# Patient Record
Sex: Female | Born: 1935
Health system: Southern US, Community
[De-identification: ages and names within clinical notes are randomized; demographics above are authoritative.]

## PROBLEM LIST (undated history)

## (undated) DIAGNOSIS — F0390 Unspecified dementia without behavioral disturbance: Secondary | ICD-10-CM

## (undated) DIAGNOSIS — C801 Malignant (primary) neoplasm, unspecified: Secondary | ICD-10-CM

## (undated) DIAGNOSIS — R200 Anesthesia of skin: Secondary | ICD-10-CM

## (undated) DIAGNOSIS — I2510 Atherosclerotic heart disease of native coronary artery without angina pectoris: Secondary | ICD-10-CM

## (undated) DIAGNOSIS — D649 Anemia, unspecified: Secondary | ICD-10-CM

## (undated) DIAGNOSIS — I5032 Chronic diastolic (congestive) heart failure: Secondary | ICD-10-CM

## (undated) DIAGNOSIS — I509 Heart failure, unspecified: Secondary | ICD-10-CM

## (undated) DIAGNOSIS — I10 Essential (primary) hypertension: Secondary | ICD-10-CM

## (undated) DIAGNOSIS — G4700 Insomnia, unspecified: Secondary | ICD-10-CM

## (undated) DIAGNOSIS — M353 Polymyalgia rheumatica: Secondary | ICD-10-CM

## (undated) DIAGNOSIS — M2540 Effusion, unspecified joint: Secondary | ICD-10-CM

## (undated) DIAGNOSIS — C50919 Malignant neoplasm of unspecified site of unspecified female breast: Secondary | ICD-10-CM

## (undated) DIAGNOSIS — F419 Anxiety disorder, unspecified: Secondary | ICD-10-CM

## (undated) DIAGNOSIS — R42 Dizziness and giddiness: Secondary | ICD-10-CM

## (undated) DIAGNOSIS — M1990 Unspecified osteoarthritis, unspecified site: Secondary | ICD-10-CM

## (undated) DIAGNOSIS — R519 Headache, unspecified: Secondary | ICD-10-CM

## (undated) DIAGNOSIS — Z955 Presence of coronary angioplasty implant and graft: Secondary | ICD-10-CM

## (undated) DIAGNOSIS — M2550 Pain in unspecified joint: Secondary | ICD-10-CM

## (undated) DIAGNOSIS — IMO0001 Reserved for inherently not codable concepts without codable children: Secondary | ICD-10-CM

## (undated) DIAGNOSIS — M109 Gout, unspecified: Secondary | ICD-10-CM

## (undated) DIAGNOSIS — K219 Gastro-esophageal reflux disease without esophagitis: Secondary | ICD-10-CM

## (undated) DIAGNOSIS — N183 Chronic kidney disease, stage 3 unspecified: Secondary | ICD-10-CM

## (undated) DIAGNOSIS — F32A Depression, unspecified: Secondary | ICD-10-CM

## (undated) DIAGNOSIS — G4733 Obstructive sleep apnea (adult) (pediatric): Secondary | ICD-10-CM

## (undated) DIAGNOSIS — IMO0002 Reserved for concepts with insufficient information to code with codable children: Secondary | ICD-10-CM

## (undated) DIAGNOSIS — Z972 Presence of dental prosthetic device (complete) (partial): Secondary | ICD-10-CM

## (undated) DIAGNOSIS — F329 Major depressive disorder, single episode, unspecified: Secondary | ICD-10-CM

## (undated) DIAGNOSIS — Z86718 Personal history of other venous thrombosis and embolism: Secondary | ICD-10-CM

## (undated) DIAGNOSIS — I2699 Other pulmonary embolism without acute cor pulmonale: Secondary | ICD-10-CM

## (undated) DIAGNOSIS — E785 Hyperlipidemia, unspecified: Secondary | ICD-10-CM

## (undated) DIAGNOSIS — E1165 Type 2 diabetes mellitus with hyperglycemia: Secondary | ICD-10-CM

## (undated) DIAGNOSIS — G8929 Other chronic pain: Secondary | ICD-10-CM

## (undated) DIAGNOSIS — I219 Acute myocardial infarction, unspecified: Secondary | ICD-10-CM

## (undated) DIAGNOSIS — T7840XA Allergy, unspecified, initial encounter: Secondary | ICD-10-CM

## (undated) DIAGNOSIS — N189 Chronic kidney disease, unspecified: Secondary | ICD-10-CM

## (undated) DIAGNOSIS — R300 Dysuria: Secondary | ICD-10-CM

## (undated) DIAGNOSIS — M1710 Unilateral primary osteoarthritis, unspecified knee: Secondary | ICD-10-CM

## (undated) DIAGNOSIS — R51 Headache: Secondary | ICD-10-CM

## (undated) HISTORY — PX: CARDIAC CATHETERIZATION: SHX172

## (undated) HISTORY — DX: Other pulmonary embolism without acute cor pulmonale: I26.99

## (undated) HISTORY — DX: Morbid (severe) obesity due to excess calories: E66.01

## (undated) HISTORY — DX: Major depressive disorder, single episode, unspecified: F32.9

## (undated) HISTORY — DX: Heart failure, unspecified: I50.9

## (undated) HISTORY — DX: Atherosclerotic heart disease of native coronary artery without angina pectoris: I25.10

## (undated) HISTORY — DX: Other chronic pain: G89.29

## (undated) HISTORY — DX: Insomnia, unspecified: G47.00

## (undated) HISTORY — DX: Presence of coronary angioplasty implant and graft: Z95.5

## (undated) HISTORY — DX: Gout, unspecified: M10.9

## (undated) HISTORY — DX: Gastro-esophageal reflux disease without esophagitis: K21.9

## (undated) HISTORY — DX: Allergy, unspecified, initial encounter: T78.40XA

## (undated) HISTORY — PX: APPENDECTOMY: SHX54

## (undated) HISTORY — DX: Malignant neoplasm of unspecified site of unspecified female breast: C50.919

## (undated) HISTORY — PX: CORONARY ANGIOPLASTY: SHX604

## (undated) HISTORY — DX: Chronic diastolic (congestive) heart failure: I50.32

## (undated) HISTORY — PX: COLONOSCOPY: SHX174

## (undated) HISTORY — DX: Unspecified dementia, unspecified severity, without behavioral disturbance, psychotic disturbance, mood disturbance, and anxiety: F03.90

## (undated) HISTORY — DX: Essential (primary) hypertension: I10

## (undated) HISTORY — PX: EYE SURGERY: SHX253

## (undated) HISTORY — DX: Polymyalgia rheumatica: M35.3

## (undated) HISTORY — DX: Chronic kidney disease, stage 3 unspecified: N18.30

## (undated) HISTORY — DX: Type 2 diabetes mellitus with hyperglycemia: E11.65

## (undated) HISTORY — DX: Unspecified osteoarthritis, unspecified site: M19.90

## (undated) HISTORY — DX: Anxiety disorder, unspecified: F41.9

## (undated) HISTORY — DX: Anemia, unspecified: D64.9

## (undated) HISTORY — DX: Obstructive sleep apnea (adult) (pediatric): G47.33

## (undated) HISTORY — DX: Depression, unspecified: F32.A

## (undated) HISTORY — DX: Hyperlipidemia, unspecified: E78.5

## (undated) HISTORY — PX: ABDOMINAL HYSTERECTOMY: SHX81

## (undated) HISTORY — DX: Dysuria: R30.0

## (undated) HISTORY — DX: Reserved for concepts with insufficient information to code with codable children: IMO0002

## (undated) HISTORY — DX: Unilateral primary osteoarthritis, unspecified knee: M17.10

---
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
## 1975-05-09 HISTORY — PX: GASTRIC BYPASS: SHX52

## 1997-11-04 ENCOUNTER — Emergency Department (HOSPITAL_COMMUNITY): Admission: EM | Admit: 1997-11-04 | Discharge: 1997-11-04 | Payer: Self-pay

## 1997-11-04 ENCOUNTER — Encounter: Payer: Self-pay | Admitting: Internal Medicine

## 1997-12-29 ENCOUNTER — Other Ambulatory Visit: Admission: RE | Admit: 1997-12-29 | Discharge: 1997-12-29 | Payer: Self-pay | Admitting: Obstetrics and Gynecology

## 1998-01-20 ENCOUNTER — Ambulatory Visit (HOSPITAL_COMMUNITY): Admission: RE | Admit: 1998-01-20 | Discharge: 1998-01-20 | Payer: Self-pay | Admitting: Obstetrics and Gynecology

## 1999-02-20 ENCOUNTER — Emergency Department (HOSPITAL_COMMUNITY): Admission: EM | Admit: 1999-02-20 | Discharge: 1999-02-20 | Payer: Self-pay

## 1999-05-17 ENCOUNTER — Encounter (INDEPENDENT_AMBULATORY_CARE_PROVIDER_SITE_OTHER): Payer: Self-pay | Admitting: Specialist

## 1999-05-17 ENCOUNTER — Other Ambulatory Visit: Admission: RE | Admit: 1999-05-17 | Discharge: 1999-05-17 | Payer: Self-pay | Admitting: Family Medicine

## 1999-05-17 ENCOUNTER — Other Ambulatory Visit: Admission: RE | Admit: 1999-05-17 | Discharge: 1999-05-17 | Payer: Self-pay | Admitting: Obstetrics and Gynecology

## 2000-05-24 ENCOUNTER — Other Ambulatory Visit: Admission: RE | Admit: 2000-05-24 | Discharge: 2000-05-24 | Payer: Self-pay | Admitting: Obstetrics and Gynecology

## 2000-05-28 ENCOUNTER — Encounter: Payer: Self-pay | Admitting: Emergency Medicine

## 2000-05-28 ENCOUNTER — Inpatient Hospital Stay (HOSPITAL_COMMUNITY): Admission: EM | Admit: 2000-05-28 | Discharge: 2000-05-31 | Payer: Self-pay | Admitting: Emergency Medicine

## 2000-12-03 ENCOUNTER — Encounter: Admission: RE | Admit: 2000-12-03 | Discharge: 2001-03-03 | Payer: Self-pay | Admitting: Internal Medicine

## 2001-03-11 ENCOUNTER — Encounter: Admission: RE | Admit: 2001-03-11 | Discharge: 2001-06-09 | Payer: Self-pay | Admitting: Internal Medicine

## 2001-07-10 ENCOUNTER — Encounter: Admission: RE | Admit: 2001-07-10 | Discharge: 2001-10-08 | Payer: Self-pay | Admitting: Internal Medicine

## 2001-07-23 ENCOUNTER — Other Ambulatory Visit: Admission: RE | Admit: 2001-07-23 | Discharge: 2001-07-23 | Payer: Self-pay | Admitting: Obstetrics and Gynecology

## 2002-02-24 ENCOUNTER — Encounter: Payer: Self-pay | Admitting: Emergency Medicine

## 2002-02-24 ENCOUNTER — Inpatient Hospital Stay (HOSPITAL_COMMUNITY): Admission: EM | Admit: 2002-02-24 | Discharge: 2002-02-27 | Payer: Self-pay | Admitting: Emergency Medicine

## 2002-05-08 DIAGNOSIS — I219 Acute myocardial infarction, unspecified: Secondary | ICD-10-CM

## 2002-05-08 HISTORY — PX: OTHER SURGICAL HISTORY: SHX169

## 2002-05-08 HISTORY — DX: Acute myocardial infarction, unspecified: I21.9

## 2002-09-05 ENCOUNTER — Other Ambulatory Visit: Admission: RE | Admit: 2002-09-05 | Discharge: 2002-09-05 | Payer: Self-pay | Admitting: Obstetrics and Gynecology

## 2002-09-06 ENCOUNTER — Emergency Department (HOSPITAL_COMMUNITY): Admission: EM | Admit: 2002-09-06 | Discharge: 2002-09-06 | Payer: Self-pay

## 2002-12-03 ENCOUNTER — Inpatient Hospital Stay (HOSPITAL_COMMUNITY): Admission: EM | Admit: 2002-12-03 | Discharge: 2002-12-05 | Payer: Self-pay | Admitting: Emergency Medicine

## 2002-12-03 ENCOUNTER — Encounter: Payer: Self-pay | Admitting: Emergency Medicine

## 2003-01-26 ENCOUNTER — Encounter (HOSPITAL_COMMUNITY): Admission: RE | Admit: 2003-01-26 | Discharge: 2003-04-10 | Payer: Self-pay | Admitting: *Deleted

## 2003-03-30 ENCOUNTER — Ambulatory Visit (HOSPITAL_COMMUNITY): Admission: RE | Admit: 2003-03-30 | Discharge: 2003-03-30 | Payer: Self-pay | Admitting: *Deleted

## 2004-01-07 ENCOUNTER — Other Ambulatory Visit: Admission: RE | Admit: 2004-01-07 | Discharge: 2004-01-07 | Payer: Self-pay | Admitting: Obstetrics and Gynecology

## 2004-07-25 ENCOUNTER — Ambulatory Visit: Payer: Self-pay | Admitting: Internal Medicine

## 2004-08-16 ENCOUNTER — Ambulatory Visit: Payer: Self-pay | Admitting: Cardiology

## 2004-08-17 ENCOUNTER — Inpatient Hospital Stay (HOSPITAL_COMMUNITY): Admission: EM | Admit: 2004-08-17 | Discharge: 2004-08-20 | Payer: Self-pay | Admitting: Emergency Medicine

## 2004-08-19 ENCOUNTER — Ambulatory Visit: Payer: Self-pay | Admitting: Internal Medicine

## 2004-08-20 ENCOUNTER — Encounter: Payer: Self-pay | Admitting: Internal Medicine

## 2004-08-26 ENCOUNTER — Ambulatory Visit: Payer: Self-pay | Admitting: Cardiology

## 2004-08-30 ENCOUNTER — Ambulatory Visit: Payer: Self-pay | Admitting: Internal Medicine

## 2004-09-07 ENCOUNTER — Ambulatory Visit (HOSPITAL_BASED_OUTPATIENT_CLINIC_OR_DEPARTMENT_OTHER): Admission: RE | Admit: 2004-09-07 | Discharge: 2004-09-07 | Payer: Self-pay | Admitting: Internal Medicine

## 2004-09-14 ENCOUNTER — Ambulatory Visit: Payer: Self-pay | Admitting: Pulmonary Disease

## 2004-09-22 ENCOUNTER — Ambulatory Visit: Payer: Self-pay | Admitting: Internal Medicine

## 2004-09-28 ENCOUNTER — Ambulatory Visit: Payer: Self-pay | Admitting: Family Medicine

## 2005-03-10 ENCOUNTER — Ambulatory Visit: Payer: Self-pay | Admitting: Internal Medicine

## 2005-05-09 ENCOUNTER — Other Ambulatory Visit: Admission: RE | Admit: 2005-05-09 | Discharge: 2005-05-09 | Payer: Self-pay | Admitting: Obstetrics and Gynecology

## 2005-05-18 ENCOUNTER — Emergency Department (HOSPITAL_COMMUNITY): Admission: EM | Admit: 2005-05-18 | Discharge: 2005-05-18 | Payer: Self-pay | Admitting: Emergency Medicine

## 2005-05-22 ENCOUNTER — Ambulatory Visit: Payer: Self-pay | Admitting: Cardiology

## 2005-05-23 ENCOUNTER — Ambulatory Visit: Payer: Self-pay | Admitting: Cardiology

## 2005-05-25 ENCOUNTER — Encounter (HOSPITAL_COMMUNITY): Admission: RE | Admit: 2005-05-25 | Discharge: 2005-08-23 | Payer: Self-pay | Admitting: Cardiology

## 2005-07-05 ENCOUNTER — Ambulatory Visit: Payer: Self-pay | Admitting: Cardiology

## 2005-07-18 ENCOUNTER — Ambulatory Visit: Payer: Self-pay | Admitting: Cardiology

## 2005-07-27 ENCOUNTER — Ambulatory Visit: Payer: Self-pay | Admitting: Internal Medicine

## 2005-09-07 ENCOUNTER — Ambulatory Visit: Payer: Self-pay | Admitting: Internal Medicine

## 2005-12-20 ENCOUNTER — Ambulatory Visit: Payer: Self-pay | Admitting: Cardiology

## 2005-12-27 ENCOUNTER — Ambulatory Visit: Payer: Self-pay | Admitting: Internal Medicine

## 2006-01-05 ENCOUNTER — Encounter: Admission: RE | Admit: 2006-01-05 | Discharge: 2006-01-05 | Payer: Self-pay | Admitting: Specialist

## 2006-02-02 ENCOUNTER — Observation Stay (HOSPITAL_COMMUNITY): Admission: EM | Admit: 2006-02-02 | Discharge: 2006-02-03 | Payer: Self-pay | Admitting: *Deleted

## 2006-02-02 ENCOUNTER — Ambulatory Visit: Payer: Self-pay | Admitting: Cardiology

## 2006-02-05 ENCOUNTER — Ambulatory Visit: Payer: Self-pay | Admitting: Internal Medicine

## 2006-05-11 ENCOUNTER — Ambulatory Visit: Payer: Self-pay | Admitting: Internal Medicine

## 2006-05-11 LAB — CONVERTED CEMR LAB
Hgb A1c MFr Bld: 8.9 % — ABNORMAL HIGH (ref 4.6–6.0)
Pro B Natriuretic peptide (BNP): 197 pg/mL — ABNORMAL HIGH (ref 0.0–100.0)

## 2006-06-11 ENCOUNTER — Ambulatory Visit: Payer: Self-pay | Admitting: Cardiology

## 2006-07-05 ENCOUNTER — Ambulatory Visit: Payer: Self-pay | Admitting: Internal Medicine

## 2006-08-15 ENCOUNTER — Ambulatory Visit: Payer: Self-pay | Admitting: Internal Medicine

## 2006-08-27 ENCOUNTER — Ambulatory Visit: Payer: Self-pay | Admitting: Internal Medicine

## 2006-10-16 ENCOUNTER — Ambulatory Visit: Payer: Self-pay | Admitting: Internal Medicine

## 2006-11-16 ENCOUNTER — Ambulatory Visit: Payer: Self-pay | Admitting: Cardiology

## 2006-11-16 ENCOUNTER — Inpatient Hospital Stay (HOSPITAL_COMMUNITY): Admission: EM | Admit: 2006-11-16 | Discharge: 2006-11-16 | Payer: Self-pay | Admitting: Emergency Medicine

## 2006-11-28 ENCOUNTER — Ambulatory Visit: Payer: Self-pay | Admitting: Cardiology

## 2006-12-03 ENCOUNTER — Ambulatory Visit: Payer: Self-pay | Admitting: Internal Medicine

## 2006-12-03 DIAGNOSIS — Z8719 Personal history of other diseases of the digestive system: Secondary | ICD-10-CM | POA: Insufficient documentation

## 2006-12-03 DIAGNOSIS — I10 Essential (primary) hypertension: Secondary | ICD-10-CM | POA: Insufficient documentation

## 2006-12-03 DIAGNOSIS — E785 Hyperlipidemia, unspecified: Secondary | ICD-10-CM | POA: Insufficient documentation

## 2006-12-03 DIAGNOSIS — E119 Type 2 diabetes mellitus without complications: Secondary | ICD-10-CM | POA: Insufficient documentation

## 2006-12-03 HISTORY — DX: Personal history of other diseases of the digestive system: Z87.19

## 2006-12-13 ENCOUNTER — Encounter: Payer: Self-pay | Admitting: Internal Medicine

## 2006-12-28 ENCOUNTER — Ambulatory Visit: Payer: Self-pay | Admitting: Internal Medicine

## 2007-02-14 ENCOUNTER — Telehealth: Payer: Self-pay | Admitting: Internal Medicine

## 2007-02-19 ENCOUNTER — Ambulatory Visit: Payer: Self-pay | Admitting: Cardiology

## 2007-03-14 ENCOUNTER — Ambulatory Visit: Payer: Self-pay | Admitting: Internal Medicine

## 2007-03-15 ENCOUNTER — Telehealth: Payer: Self-pay | Admitting: *Deleted

## 2007-03-15 ENCOUNTER — Encounter: Payer: Self-pay | Admitting: Internal Medicine

## 2007-07-30 ENCOUNTER — Encounter: Payer: Self-pay | Admitting: Internal Medicine

## 2007-08-29 ENCOUNTER — Ambulatory Visit: Payer: Self-pay | Admitting: Internal Medicine

## 2007-08-29 DIAGNOSIS — I2510 Atherosclerotic heart disease of native coronary artery without angina pectoris: Secondary | ICD-10-CM | POA: Insufficient documentation

## 2007-08-29 DIAGNOSIS — I25118 Atherosclerotic heart disease of native coronary artery with other forms of angina pectoris: Secondary | ICD-10-CM | POA: Insufficient documentation

## 2007-08-29 DIAGNOSIS — R32 Unspecified urinary incontinence: Secondary | ICD-10-CM | POA: Insufficient documentation

## 2007-08-29 LAB — CONVERTED CEMR LAB: Blood Glucose, Fingerstick: 176

## 2007-09-23 ENCOUNTER — Encounter: Payer: Self-pay | Admitting: Internal Medicine

## 2007-10-01 ENCOUNTER — Encounter: Payer: Self-pay | Admitting: Internal Medicine

## 2007-11-13 ENCOUNTER — Ambulatory Visit: Payer: Self-pay | Admitting: Internal Medicine

## 2007-11-13 DIAGNOSIS — B369 Superficial mycosis, unspecified: Secondary | ICD-10-CM | POA: Insufficient documentation

## 2007-11-13 HISTORY — DX: Superficial mycosis, unspecified: B36.9

## 2007-11-20 ENCOUNTER — Ambulatory Visit: Payer: Self-pay | Admitting: Internal Medicine

## 2007-11-20 LAB — CONVERTED CEMR LAB: Hgb A1c MFr Bld: 7.4 % — ABNORMAL HIGH (ref 4.6–6.0)

## 2007-11-22 ENCOUNTER — Encounter: Payer: Self-pay | Admitting: Internal Medicine

## 2008-01-07 ENCOUNTER — Ambulatory Visit: Payer: Self-pay | Admitting: Internal Medicine

## 2008-01-07 DIAGNOSIS — G44209 Tension-type headache, unspecified, not intractable: Secondary | ICD-10-CM | POA: Insufficient documentation

## 2008-01-07 DIAGNOSIS — R519 Headache, unspecified: Secondary | ICD-10-CM

## 2008-01-07 DIAGNOSIS — R51 Headache: Secondary | ICD-10-CM

## 2008-01-07 HISTORY — DX: Headache, unspecified: R51.9

## 2008-02-13 ENCOUNTER — Ambulatory Visit: Payer: Self-pay | Admitting: Family Medicine

## 2008-03-23 ENCOUNTER — Telehealth: Payer: Self-pay | Admitting: Internal Medicine

## 2008-05-07 ENCOUNTER — Telehealth: Payer: Self-pay | Admitting: Family Medicine

## 2008-05-14 ENCOUNTER — Ambulatory Visit: Payer: Self-pay | Admitting: Internal Medicine

## 2008-05-14 DIAGNOSIS — L509 Urticaria, unspecified: Secondary | ICD-10-CM | POA: Insufficient documentation

## 2008-05-20 ENCOUNTER — Ambulatory Visit: Payer: Self-pay | Admitting: Cardiology

## 2008-07-28 ENCOUNTER — Ambulatory Visit: Payer: Self-pay | Admitting: Internal Medicine

## 2008-07-30 LAB — CONVERTED CEMR LAB
Hgb A1c MFr Bld: 8 % — ABNORMAL HIGH (ref 4.6–6.5)
Pro B Natriuretic peptide (BNP): 115 pg/mL — ABNORMAL HIGH (ref 0.0–100.0)

## 2008-09-28 ENCOUNTER — Encounter: Payer: Self-pay | Admitting: Internal Medicine

## 2008-10-20 ENCOUNTER — Telehealth: Payer: Self-pay | Admitting: Family Medicine

## 2008-10-21 ENCOUNTER — Ambulatory Visit: Payer: Self-pay | Admitting: Internal Medicine

## 2008-10-22 LAB — CONVERTED CEMR LAB
BUN: 20 mg/dL (ref 6–23)
Basophils Absolute: 0.1 10*3/uL (ref 0.0–0.1)
Basophils Relative: 0.6 % (ref 0.0–3.0)
CO2: 30 meq/L (ref 19–32)
Calcium: 9.2 mg/dL (ref 8.4–10.5)
Chloride: 96 meq/L (ref 96–112)
Creatinine, Ser: 1 mg/dL (ref 0.4–1.2)
Eosinophils Absolute: 0.3 10*3/uL (ref 0.0–0.7)
Eosinophils Relative: 2.3 % (ref 0.0–5.0)
GFR calc non Af Amer: 70.09 mL/min (ref >60)
Glucose, Bld: 220 mg/dL — ABNORMAL HIGH (ref 70–99)
HCT: 31.6 % — ABNORMAL LOW (ref 36.0–46.0)
Hemoglobin: 10.8 g/dL — ABNORMAL LOW (ref 12.0–15.0)
Hgb A1c MFr Bld: 8.5 % — ABNORMAL HIGH (ref 4.6–6.5)
Lymphocytes Relative: 23 % (ref 12.0–46.0)
Lymphs Abs: 2.6 10*3/uL (ref 0.7–4.0)
MCHC: 34.3 g/dL (ref 30.0–36.0)
MCV: 83.3 fL (ref 78.0–100.0)
Monocytes Absolute: 0.7 10*3/uL (ref 0.1–1.0)
Monocytes Relative: 6.4 % (ref 3.0–12.0)
Neutro Abs: 7.8 10*3/uL — ABNORMAL HIGH (ref 1.4–7.7)
Neutrophils Relative %: 67.7 % (ref 43.0–77.0)
Platelets: 337 10*3/uL (ref 150.0–400.0)
Potassium: 5.3 meq/L — ABNORMAL HIGH (ref 3.5–5.1)
RBC: 3.8 M/uL — ABNORMAL LOW (ref 3.87–5.11)
RDW: 14.1 % (ref 11.5–14.6)
Sed Rate: 96 mm/hr — ABNORMAL HIGH (ref 0–22)
Sodium: 137 meq/L (ref 135–145)
WBC: 11.5 10*3/uL — ABNORMAL HIGH (ref 4.5–10.5)

## 2009-01-15 ENCOUNTER — Telehealth: Payer: Self-pay | Admitting: Internal Medicine

## 2009-01-18 ENCOUNTER — Ambulatory Visit: Payer: Self-pay | Admitting: Internal Medicine

## 2009-01-18 DIAGNOSIS — M353 Polymyalgia rheumatica: Secondary | ICD-10-CM | POA: Insufficient documentation

## 2009-01-18 DIAGNOSIS — K625 Hemorrhage of anus and rectum: Secondary | ICD-10-CM | POA: Insufficient documentation

## 2009-01-19 LAB — CONVERTED CEMR LAB
Basophils Absolute: 0.1 10*3/uL (ref 0.0–0.1)
Basophils Relative: 0.7 % (ref 0.0–3.0)
Eosinophils Absolute: 0.2 10*3/uL (ref 0.0–0.7)
Eosinophils Relative: 1.5 % (ref 0.0–5.0)
HCT: 30.4 % — ABNORMAL LOW (ref 36.0–46.0)
Hemoglobin: 10 g/dL — ABNORMAL LOW (ref 12.0–15.0)
Hgb A1c MFr Bld: 8.4 % — ABNORMAL HIGH (ref 4.6–6.5)
Lymphocytes Relative: 16.1 % (ref 12.0–46.0)
Lymphs Abs: 1.9 10*3/uL (ref 0.7–4.0)
MCHC: 32.9 g/dL (ref 30.0–36.0)
MCV: 86.2 fL (ref 78.0–100.0)
Monocytes Absolute: 0.5 10*3/uL (ref 0.1–1.0)
Monocytes Relative: 4 % (ref 3.0–12.0)
Neutro Abs: 8.9 10*3/uL — ABNORMAL HIGH (ref 1.4–7.7)
Neutrophils Relative %: 77.7 % — ABNORMAL HIGH (ref 43.0–77.0)
Platelets: 295 10*3/uL (ref 150.0–400.0)
RBC: 3.53 M/uL — ABNORMAL LOW (ref 3.87–5.11)
RDW: 15 % — ABNORMAL HIGH (ref 11.5–14.6)
Sed Rate: 98 mm/hr — ABNORMAL HIGH (ref 0–22)
WBC: 11.6 10*3/uL — ABNORMAL HIGH (ref 4.5–10.5)

## 2009-01-21 ENCOUNTER — Telehealth: Payer: Self-pay | Admitting: Internal Medicine

## 2009-01-22 ENCOUNTER — Encounter (INDEPENDENT_AMBULATORY_CARE_PROVIDER_SITE_OTHER): Payer: Self-pay | Admitting: *Deleted

## 2009-02-15 ENCOUNTER — Ambulatory Visit: Payer: Self-pay | Admitting: Internal Medicine

## 2009-02-15 DIAGNOSIS — D509 Iron deficiency anemia, unspecified: Secondary | ICD-10-CM | POA: Insufficient documentation

## 2009-02-15 DIAGNOSIS — D649 Anemia, unspecified: Secondary | ICD-10-CM | POA: Insufficient documentation

## 2009-02-15 DIAGNOSIS — E66813 Obesity, class 3: Secondary | ICD-10-CM | POA: Insufficient documentation

## 2009-02-15 LAB — CONVERTED CEMR LAB
Basophils Absolute: 0 10*3/uL (ref 0.0–0.1)
Basophils Relative: 0.1 % (ref 0.0–3.0)
Eosinophils Absolute: 0.2 10*3/uL (ref 0.0–0.7)
Eosinophils Relative: 2.5 % (ref 0.0–5.0)
Ferritin: 36.1 ng/mL (ref 10.0–291.0)
Folate: 3.9 ng/mL
HCT: 31.9 % — ABNORMAL LOW (ref 36.0–46.0)
Hemoglobin: 10.9 g/dL — ABNORMAL LOW (ref 12.0–15.0)
Iron: 35 ug/dL — ABNORMAL LOW (ref 42–145)
Lymphocytes Relative: 21.8 % (ref 12.0–46.0)
Lymphs Abs: 2.2 10*3/uL (ref 0.7–4.0)
MCHC: 34.2 g/dL (ref 30.0–36.0)
MCV: 83.6 fL (ref 78.0–100.0)
Monocytes Absolute: 0.3 10*3/uL (ref 0.1–1.0)
Monocytes Relative: 3.3 % (ref 3.0–12.0)
Neutro Abs: 7.2 10*3/uL (ref 1.4–7.7)
Neutrophils Relative %: 72.3 % (ref 43.0–77.0)
Platelets: 305 10*3/uL (ref 150.0–400.0)
RBC: 3.81 M/uL — ABNORMAL LOW (ref 3.87–5.11)
RDW: 14.5 % (ref 11.5–14.6)
Saturation Ratios: 11.7 % — ABNORMAL LOW (ref 20.0–50.0)
Transferrin: 214.3 mg/dL (ref 212.0–360.0)
Vitamin B-12: 284 pg/mL (ref 211–911)
WBC: 9.9 10*3/uL (ref 4.5–10.5)

## 2009-03-05 ENCOUNTER — Encounter (INDEPENDENT_AMBULATORY_CARE_PROVIDER_SITE_OTHER): Payer: Self-pay | Admitting: *Deleted

## 2009-03-09 ENCOUNTER — Ambulatory Visit: Payer: Self-pay | Admitting: Internal Medicine

## 2009-03-09 DIAGNOSIS — R0601 Orthopnea: Secondary | ICD-10-CM | POA: Insufficient documentation

## 2009-03-09 LAB — CONVERTED CEMR LAB
ALT: 25 units/L (ref 0–35)
AST: 27 units/L (ref 0–37)
Albumin: 3.2 g/dL — ABNORMAL LOW (ref 3.5–5.2)
Alkaline Phosphatase: 54 units/L (ref 39–117)
BUN: 14 mg/dL (ref 6–23)
Basophils Absolute: 0 10*3/uL (ref 0.0–0.1)
Basophils Relative: 0 % (ref 0.0–3.0)
Bilirubin, Direct: 0 mg/dL (ref 0.0–0.3)
CO2: 28 meq/L (ref 19–32)
Calcium: 8.5 mg/dL (ref 8.4–10.5)
Chloride: 100 meq/L (ref 96–112)
Creatinine, Ser: 1.2 mg/dL (ref 0.4–1.2)
Eosinophils Absolute: 0.3 10*3/uL (ref 0.0–0.7)
Eosinophils Relative: 2.2 % (ref 0.0–5.0)
GFR calc non Af Amer: 56.73 mL/min (ref >60)
Glucose, Bld: 249 mg/dL — ABNORMAL HIGH (ref 70–99)
HCT: 32.8 % — ABNORMAL LOW (ref 36.0–46.0)
Hemoglobin: 10.7 g/dL — ABNORMAL LOW (ref 12.0–15.0)
Hgb A1c MFr Bld: 8.1 % — ABNORMAL HIGH (ref 4.6–6.5)
Lymphocytes Relative: 22.5 % (ref 12.0–46.0)
Lymphs Abs: 2.6 10*3/uL (ref 0.7–4.0)
MCHC: 32.6 g/dL (ref 30.0–36.0)
MCV: 87.8 fL (ref 78.0–100.0)
Monocytes Absolute: 0.4 10*3/uL (ref 0.1–1.0)
Monocytes Relative: 3.1 % (ref 3.0–12.0)
Neutro Abs: 8.3 10*3/uL — ABNORMAL HIGH (ref 1.4–7.7)
Neutrophils Relative %: 72.2 % (ref 43.0–77.0)
Platelets: 324 10*3/uL (ref 150.0–400.0)
Potassium: 4.4 meq/L (ref 3.5–5.1)
Pro B Natriuretic peptide (BNP): 26 pg/mL (ref 0.0–100.0)
RBC: 3.74 M/uL — ABNORMAL LOW (ref 3.87–5.11)
RDW: 14.2 % (ref 11.5–14.6)
Sed Rate: 98 mm/hr — ABNORMAL HIGH (ref 0–22)
Sodium: 138 meq/L (ref 135–145)
TSH: 1.19 microintl units/mL (ref 0.35–5.50)
Total Bilirubin: 0.6 mg/dL (ref 0.3–1.2)
Total Protein: 8.1 g/dL (ref 6.0–8.3)
WBC: 11.6 10*3/uL — ABNORMAL HIGH (ref 4.5–10.5)

## 2009-03-10 ENCOUNTER — Encounter: Payer: Self-pay | Admitting: Internal Medicine

## 2009-03-10 ENCOUNTER — Telehealth: Payer: Self-pay | Admitting: Internal Medicine

## 2009-03-10 ENCOUNTER — Ambulatory Visit: Payer: Self-pay | Admitting: Cardiology

## 2009-03-10 LAB — CONVERTED CEMR LAB
BUN: 17 mg/dL (ref 6–23)
Creatinine, Ser: 1.1 mg/dL (ref 0.40–1.20)

## 2009-03-11 ENCOUNTER — Telehealth: Payer: Self-pay | Admitting: Internal Medicine

## 2009-03-15 ENCOUNTER — Encounter (INDEPENDENT_AMBULATORY_CARE_PROVIDER_SITE_OTHER): Payer: Self-pay | Admitting: *Deleted

## 2009-03-22 ENCOUNTER — Ambulatory Visit: Payer: Self-pay | Admitting: Internal Medicine

## 2009-03-22 ENCOUNTER — Telehealth (INDEPENDENT_AMBULATORY_CARE_PROVIDER_SITE_OTHER): Payer: Self-pay

## 2009-03-22 ENCOUNTER — Ambulatory Visit (HOSPITAL_COMMUNITY): Admission: RE | Admit: 2009-03-22 | Discharge: 2009-03-22 | Payer: Self-pay | Admitting: Internal Medicine

## 2009-06-03 ENCOUNTER — Telehealth: Payer: Self-pay | Admitting: Internal Medicine

## 2009-06-04 ENCOUNTER — Ambulatory Visit: Payer: Self-pay | Admitting: Internal Medicine

## 2009-06-04 DIAGNOSIS — F4321 Adjustment disorder with depressed mood: Secondary | ICD-10-CM

## 2009-06-04 HISTORY — DX: Adjustment disorder with depressed mood: F43.21

## 2009-06-04 LAB — CONVERTED CEMR LAB
Hgb A1c MFr Bld: 8 % — ABNORMAL HIGH (ref 4.6–6.5)
Sed Rate: 97 mm/hr — ABNORMAL HIGH (ref 0–22)

## 2009-06-07 ENCOUNTER — Telehealth: Payer: Self-pay | Admitting: Internal Medicine

## 2009-06-09 ENCOUNTER — Telehealth: Payer: Self-pay | Admitting: Internal Medicine

## 2009-07-07 ENCOUNTER — Ambulatory Visit: Payer: Self-pay | Admitting: Internal Medicine

## 2009-08-18 ENCOUNTER — Encounter: Payer: Self-pay | Admitting: Internal Medicine

## 2009-09-28 ENCOUNTER — Telehealth: Payer: Self-pay | Admitting: Internal Medicine

## 2009-10-25 ENCOUNTER — Telehealth (INDEPENDENT_AMBULATORY_CARE_PROVIDER_SITE_OTHER): Payer: Self-pay | Admitting: *Deleted

## 2009-11-01 ENCOUNTER — Telehealth: Payer: Self-pay | Admitting: Internal Medicine

## 2009-11-01 ENCOUNTER — Ambulatory Visit: Payer: Self-pay | Admitting: Internal Medicine

## 2009-11-02 LAB — CONVERTED CEMR LAB
ALT: 20 units/L (ref 0–35)
AST: 20 units/L (ref 0–37)
Albumin: 3.3 g/dL — ABNORMAL LOW (ref 3.5–5.2)
Alkaline Phosphatase: 77 units/L (ref 39–117)
BUN: 27 mg/dL — ABNORMAL HIGH (ref 6–23)
Basophils Absolute: 0 10*3/uL (ref 0.0–0.1)
Basophils Relative: 0.2 % (ref 0.0–3.0)
Bilirubin, Direct: 0.1 mg/dL (ref 0.0–0.3)
CO2: 26 meq/L (ref 19–32)
Calcium: 8.7 mg/dL (ref 8.4–10.5)
Chloride: 102 meq/L (ref 96–112)
Cholesterol: 118 mg/dL (ref 0–200)
Creatinine, Ser: 1.1 mg/dL (ref 0.4–1.2)
Eosinophils Absolute: 0.1 10*3/uL (ref 0.0–0.7)
Eosinophils Relative: 0.7 % (ref 0.0–5.0)
GFR calc non Af Amer: 63.95 mL/min (ref >60)
Glucose, Bld: 214 mg/dL — ABNORMAL HIGH (ref 70–99)
HCT: 33.3 % — ABNORMAL LOW (ref 36.0–46.0)
Hemoglobin: 11.1 g/dL — ABNORMAL LOW (ref 12.0–15.0)
Hgb A1c MFr Bld: 9.8 % — ABNORMAL HIGH (ref 4.6–6.5)
Lymphocytes Relative: 17.1 % (ref 12.0–46.0)
Lymphs Abs: 2.4 10*3/uL (ref 0.7–4.0)
MCHC: 33.4 g/dL (ref 30.0–36.0)
MCV: 86.5 fL (ref 78.0–100.0)
Monocytes Absolute: 0.8 10*3/uL (ref 0.1–1.0)
Monocytes Relative: 6.1 % (ref 3.0–12.0)
Neutro Abs: 10.5 10*3/uL — ABNORMAL HIGH (ref 1.4–7.7)
Neutrophils Relative %: 75.9 % (ref 43.0–77.0)
Platelets: 371 10*3/uL (ref 150.0–400.0)
Potassium: 5.2 meq/L — ABNORMAL HIGH (ref 3.5–5.1)
RBC: 3.85 M/uL — ABNORMAL LOW (ref 3.87–5.11)
RDW: 14.9 % — ABNORMAL HIGH (ref 11.5–14.6)
Sed Rate: 108 mm/hr — ABNORMAL HIGH (ref 0–22)
Sodium: 136 meq/L (ref 135–145)
TSH: 1.03 microintl units/mL (ref 0.35–5.50)
Total Bilirubin: 0.5 mg/dL (ref 0.3–1.2)
Total Protein: 7.9 g/dL (ref 6.0–8.3)
WBC: 13.8 10*3/uL — ABNORMAL HIGH (ref 4.5–10.5)

## 2009-11-04 ENCOUNTER — Telehealth: Payer: Self-pay | Admitting: Internal Medicine

## 2009-11-23 ENCOUNTER — Telehealth: Payer: Self-pay | Admitting: Internal Medicine

## 2009-12-07 ENCOUNTER — Encounter: Payer: Self-pay | Admitting: Internal Medicine

## 2009-12-14 ENCOUNTER — Telehealth: Payer: Self-pay | Admitting: Internal Medicine

## 2010-01-18 ENCOUNTER — Ambulatory Visit: Payer: Self-pay | Admitting: Internal Medicine

## 2010-01-18 LAB — CONVERTED CEMR LAB: Blood Glucose, Fingerstick: 147

## 2010-01-20 LAB — CONVERTED CEMR LAB: Hgb A1c MFr Bld: 9.8 % — ABNORMAL HIGH (ref 4.6–6.5)

## 2010-02-22 ENCOUNTER — Telehealth: Payer: Self-pay | Admitting: Internal Medicine

## 2010-02-24 ENCOUNTER — Encounter: Payer: Self-pay | Admitting: Internal Medicine

## 2010-03-03 ENCOUNTER — Encounter: Payer: Self-pay | Admitting: Internal Medicine

## 2010-04-08 ENCOUNTER — Encounter: Payer: Self-pay | Admitting: Internal Medicine

## 2010-05-09 LAB — HM COLONOSCOPY

## 2010-05-28 ENCOUNTER — Emergency Department (HOSPITAL_COMMUNITY)
Admission: EM | Admit: 2010-05-28 | Discharge: 2010-05-28 | Payer: Self-pay | Source: Home / Self Care | Admitting: Emergency Medicine

## 2010-05-31 LAB — DIFFERENTIAL
Basophils Absolute: 0 10*3/uL (ref 0.0–0.1)
Basophils Relative: 0 % (ref 0–1)
Eosinophils Absolute: 0.1 10*3/uL (ref 0.0–0.7)
Eosinophils Relative: 1 % (ref 0–5)
Lymphocytes Relative: 15 % (ref 12–46)
Lymphs Abs: 2.2 10*3/uL (ref 0.7–4.0)
Monocytes Absolute: 0.7 10*3/uL (ref 0.1–1.0)
Monocytes Relative: 5 % (ref 3–12)
Neutro Abs: 11.1 10*3/uL — ABNORMAL HIGH (ref 1.7–7.7)
Neutrophils Relative %: 79 % — ABNORMAL HIGH (ref 43–77)

## 2010-05-31 LAB — CK TOTAL AND CKMB (NOT AT ARMC)
CK, MB: 1.1 ng/mL (ref 0.3–4.0)
Relative Index: INVALID (ref 0.0–2.5)
Total CK: 57 U/L (ref 7–177)

## 2010-05-31 LAB — BRAIN NATRIURETIC PEPTIDE: Pro B Natriuretic peptide (BNP): 107 pg/mL — ABNORMAL HIGH (ref 0.0–100.0)

## 2010-05-31 LAB — URINALYSIS, ROUTINE W REFLEX MICROSCOPIC
Bilirubin Urine: NEGATIVE
Hgb urine dipstick: NEGATIVE
Ketones, ur: NEGATIVE mg/dL
Nitrite: POSITIVE — AB
Protein, ur: NEGATIVE mg/dL
Specific Gravity, Urine: 1.021 (ref 1.005–1.030)
Urine Glucose, Fasting: NEGATIVE mg/dL
Urobilinogen, UA: 0.2 mg/dL (ref 0.0–1.0)
pH: 6 (ref 5.0–8.0)

## 2010-05-31 LAB — COMPREHENSIVE METABOLIC PANEL
ALT: 17 U/L (ref 0–35)
AST: 16 U/L (ref 0–37)
Albumin: 3 g/dL — ABNORMAL LOW (ref 3.5–5.2)
Alkaline Phosphatase: 59 U/L (ref 39–117)
BUN: 13 mg/dL (ref 6–23)
CO2: 27 mEq/L (ref 19–32)
Calcium: 9.1 mg/dL (ref 8.4–10.5)
Chloride: 101 mEq/L (ref 96–112)
Creatinine, Ser: 0.93 mg/dL (ref 0.4–1.2)
GFR calc Af Amer: >60 mL/min (ref >60)
GFR calc non Af Amer: 59 mL/min — ABNORMAL LOW (ref >60)
Glucose, Bld: 199 mg/dL — ABNORMAL HIGH (ref 70–99)
Potassium: 6 mEq/L — ABNORMAL HIGH (ref 3.5–5.1)
Sodium: 136 mEq/L (ref 135–145)
Total Bilirubin: 0.4 mg/dL (ref 0.3–1.2)
Total Protein: 7.1 g/dL (ref 6.0–8.3)

## 2010-05-31 LAB — CBC
HCT: 31.7 % — ABNORMAL LOW (ref 36.0–46.0)
Hemoglobin: 9.7 g/dL — ABNORMAL LOW (ref 12.0–15.0)
MCH: 26.6 pg (ref 26.0–34.0)
MCHC: 30.6 g/dL (ref 30.0–36.0)
MCV: 87.1 fL (ref 78.0–100.0)
Platelets: 345 10*3/uL (ref 150–400)
RBC: 3.64 MIL/uL — ABNORMAL LOW (ref 3.87–5.11)
RDW: 14.6 % (ref 11.5–15.5)
WBC: 14.1 10*3/uL — ABNORMAL HIGH (ref 4.0–10.5)

## 2010-05-31 LAB — URINE MICROSCOPIC-ADD ON

## 2010-05-31 LAB — TROPONIN I: Troponin I: 0.02 ng/mL (ref 0.00–0.06)

## 2010-06-01 LAB — URINE CULTURE
Colony Count: >=100000
Culture  Setup Time: 201201220019

## 2010-06-02 ENCOUNTER — Telehealth: Payer: Self-pay | Admitting: Internal Medicine

## 2010-06-06 ENCOUNTER — Ambulatory Visit
Admission: RE | Admit: 2010-06-06 | Discharge: 2010-06-06 | Payer: Self-pay | Source: Home / Self Care | Attending: Internal Medicine | Admitting: Internal Medicine

## 2010-06-06 DIAGNOSIS — N390 Urinary tract infection, site not specified: Secondary | ICD-10-CM | POA: Insufficient documentation

## 2010-06-06 DIAGNOSIS — K8020 Calculus of gallbladder without cholecystitis without obstruction: Secondary | ICD-10-CM | POA: Insufficient documentation

## 2010-06-06 LAB — CONVERTED CEMR LAB: Blood Glucose, Fingerstick: 87

## 2010-06-07 ENCOUNTER — Ambulatory Visit (HOSPITAL_COMMUNITY)
Admission: RE | Admit: 2010-06-07 | Discharge: 2010-06-07 | Payer: Self-pay | Source: Home / Self Care | Attending: General Surgery | Admitting: General Surgery

## 2010-06-08 ENCOUNTER — Other Ambulatory Visit (HOSPITAL_COMMUNITY): Payer: Self-pay | Admitting: General Surgery

## 2010-06-08 ENCOUNTER — Other Ambulatory Visit: Payer: Self-pay | Admitting: General Surgery

## 2010-06-08 DIAGNOSIS — R109 Unspecified abdominal pain: Secondary | ICD-10-CM

## 2010-06-09 NOTE — Medication Information (Signed)
Summary: Order for Diabetic Testing Supplies  Order for Diabetic Testing Supplies   Imported By: Laural Benes 03/07/2010 12:43:54  _____________________________________________________________________  External Attachment:    Type:   Image     Comment:   External Document

## 2010-06-09 NOTE — Medication Information (Signed)
Summary: Order for Diabetic Testing Supplies  Order for Diabetic Testing Supplies   Imported By: Laural Benes 03/01/2010 11:25:14  _____________________________________________________________________  External Attachment:    Type:   Image     Comment:   External Document

## 2010-06-09 NOTE — Miscellaneous (Signed)
  Clinical Lists Changes  Medications: Added new medication of ISOSORBIDE MONONITRATE CR 60 MG  TB24 (ISOSORBIDE MONONITRATE) 1/2 qam - Signed Rx of ISOSORBIDE MONONITRATE CR 60 MG  TB24 (ISOSORBIDE MONONITRATE) 1/2 qam;  #90 x 6;  Signed;  Entered by: Chipper Oman, RN;  Authorized by: Marletta Lor  MD;  Method used: Historical    Prescriptions: ISOSORBIDE MONONITRATE CR 60 MG  TB24 (ISOSORBIDE MONONITRATE) 1/2 qam  #90 x 6   Entered by:   Chipper Oman, RN   Authorized by:   Marletta Lor  MD   Signed by:   Chipper Oman, RN on 10/01/2007   Method used:   Historical   [id number]

## 2010-06-09 NOTE — Letter (Signed)
Summary: Generic Letter  [facility] [place], [place] [address]   Phone: [phone]  Fax: [phone]    01/22/2009  [name] [address] [place], [place]  [address]  To Whom It May Concern:  [name] is a patient followed in our internal medicine practice.  This individual has multiple medical problems including diabetes, hypertension, coronary artery disease, and advanced arthritis.  She requires assistance in all aspects of daily living, and it is critical that she maintain family support, so that she may enjoy independent living.  [name] daughter, [name], who must travel from [place], provides a considerable support.  It is strongly recommended that she continue to provide essential assistance and support.           Sincerely,   Bluford Kaufmann  MD

## 2010-06-09 NOTE — Miscellaneous (Signed)
  Medications Added REGLAN 10 MG TABS (METOCLOPRAMIDE HCL) 1  qid       Clinical Lists Changes  Medications: Added new medication of REGLAN 10 MG TABS (METOCLOPRAMIDE HCL) 1  qid - Signed Rx of REGLAN 10 MG TABS (METOCLOPRAMIDE HCL) 1  qid;  #120 x 6;  Signed;  Entered by: Chipper Oman, RN;  Authorized by: Marletta Lor  MD;  Method used: Electronically to [facility] [address], [place], [place], [place]  [address], Ph: [phone], Fax: [phone]    Prescriptions: REGLAN 10 MG TABS (METOCLOPRAMIDE HCL) 1  qid  #120 x 6   Entered by:   Chipper Oman, RN   Authorized by:   Marletta Lor  MD   Signed by:   Chipper Oman, RN on 09/28/2008   Method used:   Electronically to        [facility] (retail)       [address]       [place],   [address]       Ph: [phone]       Fax: [phone]   RxID:   [id number]

## 2010-06-09 NOTE — Assessment & Plan Note (Signed)
Summary: 1 MTH ROV // RS   Vital Signs:  Patient profile:   76 year old female Temp:     99.0 degrees F oral BP sitting:   122 / 80  (left arm) Cuff size:   large  Vitals Entered By: Cay Schillings LPN (March  2, 624THL D34-534 AM) CC: 1 mos rov - doing just ok      FBS 112 Is Patient Diabetic? Yes   CC:  1 mos rov - doing just ok      FBS 112.  History of Present Illness: 76 year old patient who is seen today for follow PMR she has and at response to low-dose prednisone and is back to baseline.  Her blood sugars remained stable.  She has a history of hypertension.  Her depression has resolved with treatment of her PMR  Allergies: 1)  ! Sulfa 2)  ! Codeine  Past History:  Past Medical History: Reviewed history from 02/15/2009 and no changes required. COPD Diabetes mellitus, type II Diverticulitis, hx of Hyperlipidemia Hypertension Coronary artery disease Urinary incontinence insomnia Osteoarthritis morbid obesity obstructive sleep apnea polymyalgia rheumatica Anxiety Disorder Arthritis Depression Morbid obesity  Review of Systems  The patient denies anorexia, fever, weight loss, weight gain, vision loss, decreased hearing, hoarseness, chest pain, syncope, dyspnea on exertion, peripheral edema, prolonged cough, headaches, hemoptysis, abdominal pain, melena, hematochezia, severe indigestion/heartburn, hematuria, incontinence, genital sores, muscle weakness, suspicious skin lesions, transient blindness, difficulty walking, depression, unusual weight change, abnormal bleeding, enlarged lymph nodes, angioedema, and breast masses.    Physical Exam  General:  overweight-appearing.  104/64overweight-appearing.   Lungs:  Normal respiratory effort, chest expands symmetrically. Lungs are clear to auscultation, no crackles or wheezes. Heart:  Normal rate and regular rhythm. S1 and S2 normal without gallop, murmur, click, rub or other extra sounds.   Impression &  Recommendations:  Problem # 1:  OBESITY, MORBID (ICD-278.01)  Problem # 2:  POLYMYALGIA RHEUMATICA ([id number])  Orders: Sedimentation Rate, non-automated [id number]) Prescription Created Electronically [id number])  Problem # 3:  HYPERTENSION (ICD-401.9)  Her updated medication list for this problem includes:    Metoprolol Succinate 50 Mg Tb24 (Metoprolol succinate) .[name]... 1/2 tab two times a day  Her updated medication list for this problem includes:    Metoprolol Succinate 50 Mg Tb24 (Metoprolol succinate) .[name]... 1/2 tab two times a day  Complete Medication List: 1)  Metformin Hcl 1000 Mg Tabs (Metformin hcl) .... Take 1 tablet by mouth two times a day 2)  Metoprolol Succinate 50 Mg Tb24 (Metoprolol succinate) .... 1/2 tab two times a day 3)  Prozac 40 Mg Caps (Fluoxetine hcl) .... Take 1 tablet by mouth once a day 4)  Amaryl 4 Mg Tabs (Glimepiride) .... Take 1 tablet by mouth once a day 5)  Novolog Flexpen 100 Unit/ml Soln (Insulin aspart) .[name].. 16  u with meals 6)  Freestyle Lite Test Strp (Glucose blood) .... As directed 7)  Isosorbide Mononitrate Cr 60 Mg Tb24 (Isosorbide mononitrate) .... 1/2 qam 8)  Anti-fungal 1 % Powd (Tolnaftate) .... Use  twice daily 9)  Alprazolam 0.5 Mg Tbdp (Alprazolam) .[name].. 1 once daily prn 10)  Lantus Solostar 100 Unit/ml Soln (Insulin glargine) .... 64 units at bedtime, dispense 1 day supply 11)  Bd Disp Needles 30g X 1/2" Misc (Needle (disp)) .... As directed 12)  Fexofenadine Hcl 180 Mg Tabs (Fexofenadine hcl) .... One daily 13)  Proair Hfa 108 (90 Base) Mcg/act Aers (Albuterol sulfate) .... Two inhalations  every 6  hours as needed for shortness of breath 14)  Reglan 10 Mg Tabs (Metoclopramide hcl) .[name].. 1  qid 15)  Allegra 180 Mg Tabs (Fexofenadine hcl) .... Take one tab once daily 16)  Meclizine Hcl 25 Mg Chew (Meclizine hcl) .... One every 6 hours for vertigo 17)  Xyzal 5 Mg Tabs (Levocetirizine dihydrochloride) .... As needed 18)  Sudafed 30 Mg Tabs  (Pseudoephedrine hcl) .... As needed 19)  Prednisone 5 Mg Tabs (Prednisone) .... One daily 20)  Iron 325 (65 Fe) Mg Tabs (Ferrous sulfate) .[name].. 1 by mouth bid 21)  Lantus 100 Unit/ml Soln (Insulin glargine) .... 64 units at bedtime 22)  Budeprion Xl 150 Mg Xr24h-tab (Bupropion hcl) .... One daily for two weeks, then two daily  Patient Instructions: 1)  Please schedule a follow-up appointment in 3 months. 2)  Limit your Sodium (Salt). 3)  You need to lose weight. Consider a lower calorie diet and regular exercise.  Prescriptions: PREDNISONE 5 MG TABS (PREDNISONE) one daily  #90 x 4   Entered and Authorized by:   Marletta Lor  MD   Signed by:   Marletta Lor  MD on 07/07/2009   Method used:   Print then Give to Patient   RxID:   [id number] PREDNISONE 5 MG TABS (PREDNISONE) one daily  #90 x 4   Entered and Authorized by:   Marletta Lor  MD   Signed by:   Marletta Lor  MD on 07/07/2009   Method used:   Electronically to        [facility] (retail)       [address]       [place], [place]  [address]       Ph: [phone]       Fax: [phone]   RxID:   [id number]   Laboratory Results   Blood Tests   Date/Time Recieved: July 07, 2009 12:02 PM  Date/Time Reported: July 07, 2009 12:02 PM   SED rate: 72  Comments: Doy Hutching, CMA  July 07, 2009 12:02 PM

## 2010-06-09 NOTE — Assessment & Plan Note (Signed)
Summary: breathing difficulties for 2 weeks/cjr   Vital Signs:  Patient profile:   76 year old female Weight:      380 pounds BMI:     59.73 O2 Sat:      97 % on Room air Temp:     97 degrees F oral Pulse rate:   86 / minute Pulse rhythm:   regular BP sitting:   128 / 70  (left arm) Cuff size:   regular  Vitals Entered By: Chipper Oman, RN (March 09, 2009 3:37 PM)  O2 Flow:  Room air CC: C/o SOB and no energy- stays in bed all the time. BS's 118-131. Is Patient Diabetic? Yes   CC:  C/o SOB and no energy- stays in bed all the time. BS's 118-131.[name]  History of Present Illness: 76 year old patient who has a history of morbid obesity.  She has treated type 2 diabetes and has a history of COPD.  For the past month.  She complains of increasing fatigue and extremely poor exercise tolerance.  She complains of dyspnea on exertion.  She has chronic orthopnea, but she states has worsened.  Denies any peripheral edema.  Area and denies any chest pain or shortness of breath at rest.  Due to cost considerations is cut back on her insulin treatments.  Presently, she has decreased her Lantus dose from 64 to 58.  She uses a decreased mealtime dose of NovoLog 10 units prior to her largest meal only  Allergies: 1)  ! Sulfa 2)  ! Codeine  Past History:  Past Medical History: Reviewed history from 02/15/2009 and no changes required. COPD Diabetes mellitus, type II Diverticulitis, hx of Hyperlipidemia Hypertension Coronary artery disease Urinary incontinence insomnia Osteoarthritis morbid obesity obstructive sleep apnea polymyalgia rheumatica Anxiety Disorder Arthritis Depression Morbid obesity  Review of Systems       The patient complains of dyspnea on exertion, prolonged cough, muscle weakness, and difficulty walking.  The patient denies anorexia, fever, weight loss, weight gain, vision loss, decreased hearing, hoarseness, chest pain, syncope, peripheral edema, headaches,  hemoptysis, abdominal pain, melena, hematochezia, severe indigestion/heartburn, hematuria, incontinence, genital sores, transient blindness, depression, unusual weight change, abnormal bleeding, enlarged lymph nodes, angioedema, and breast masses.    Physical Exam  General:  morbidly obese.  Blood pressure 130/64, left radial Head:  Normocephalic and atraumatic without obvious abnormalities. No apparent alopecia or balding. Eyes:  No corneal or conjunctival inflammation noted. EOMI. Perrla. Funduscopic exam benign, without hemorrhages, exudates or papilledema. Vision grossly normal. Mouth:  Oral mucosa and oropharynx without lesions or exudates.  Teeth in good repair. Neck:  No deformities, masses, or tenderness noted. Lungs:  Normal respiratory effort, chest expands symmetrically. Lungs are clear to auscultation, no crackles or wheezes.  O2 saturation 98 Heart:  Normal rate and regular rhythm. S1 and S2 normal without gallop, murmur, click, rub or other extra sounds.  pulse 84 Abdomen:  Bowel sounds positive,abdomen soft and non-tender without masses, organomegaly or hernias noted. Extremities:  no clinical edema no calf tenderness  Skin:  thickened  patchy area of dry skin, right elbow Cervical Nodes:  No lymphadenopathy noted  Diabetes Management Exam:    Eye Exam:       Eye Exam done here today          Results: normal   Impression & Recommendations:  Problem # 1:  OBESITY, MORBID (ICD-278.01)  Orders: Venipuncture [id number]) TLB-Hepatic/Liver Function Pnl (80076-HEPATIC) TLB-TSH (Thyroid Stimulating Hormone) (84443-TSH)  Problem # 2:  ANEMIA (ICD-285.9)  in view of her increasing fatigue and dyspnea on exertion.  Will check a CBC  Orders: Venipuncture [id number]) TLB-BMP (Basic Metabolic Panel-BMET) (99991111) TLB-CBC Platelet - w/Differential (85025-CBCD) TLB-Hepatic/Liver Function Pnl (80076-HEPATIC) TLB-TSH (Thyroid Stimulating Hormone) (84443-TSH)  Problem # 3:   DYSPNEA ON EXERTION (ICD-786.09)  Her updated medication list for this problem includes:    Metoprolol Succinate 50 Mg Tb24 (Metoprolol succinate) .[name]... 1/2 tab two times a day    Proair Hfa 108 (90 Base) Mcg/act Aers (Albuterol sulfate) .[name]..[name] Two inhalations  every 6 hours as needed for shortness of breath will check a d-dimer  Her updated medication list for this problem includes:    Metoprolol Succinate 50 Mg Tb24 (Metoprolol succinate) .[name]... 1/2 tab two times a day    Proair Hfa 108 (90 Base) Mcg/act Aers (Albuterol sulfate) .[name]..[name] Two inhalations  every 6 hours as needed for shortness of breath  Orders: Venipuncture [id number]) TLB-Hepatic/Liver Function Pnl (80076-HEPATIC) TLB-TSH (Thyroid Stimulating Hormone) [id number]) T-D-Dimer Fibrin Derivatives Quantitive [id number]) TLB-BNP (B-Natriuretic Peptide) (83880-BNPR)  Problem # 4:  ORTHOPNEA (ICD-786.02)  patient has chronic orthopnea, probably related to her morbid obesity and restrictive lung disease; this has worsened.  Will check a BNP to rule out heart failure  Orders: Venipuncture [id number]) TLB-Hepatic/Liver Function Pnl (80076-HEPATIC) TLB-TSH (Thyroid Stimulating Hormone) (84443-TSH)  Complete Medication List: 1)  Metformin Hcl 1000 Mg Tabs (Metformin hcl) .... Take 1 tablet by mouth two times a day 2)  Metoprolol Succinate 50 Mg Tb24 (Metoprolol succinate) .... 1/2 tab two times a day 3)  Prozac 40 Mg Caps (Fluoxetine hcl) .... Take 1 tablet by mouth once a day 4)  Amaryl 4 Mg Tabs (Glimepiride) .... Take 1 tablet by mouth once a day 5)  Novolog Flexpen 100 Unit/ml Soln (Insulin aspart) .[name].. 16  u with meals 6)  Freestyle Lite Test Strp (Glucose blood) .... As directed 7)  Isosorbide Mononitrate Cr 60 Mg Tb24 (Isosorbide mononitrate) .... 1/2 qam 8)  Anti-fungal 1 % Powd (Tolnaftate) .... Use  twice daily 9)  Alprazolam 0.5 Mg Tbdp (Alprazolam) .[name].. 1 once daily prn 10)  Lantus Solostar 100 Unit/ml Soln (Insulin  glargine) .... 64 units at bedtime, dispense 1 day supply 11)  Bd Disp Needles 30g X 1/2" Misc (Needle (disp)) .... As directed 12)  Fexofenadine Hcl 180 Mg Tabs (Fexofenadine hcl) .... One daily 13)  Proair Hfa 108 (90 Base) Mcg/act Aers (Albuterol sulfate) .... Two inhalations  every 6 hours as needed for shortness of breath 14)  Reglan 10 Mg Tabs (Metoclopramide hcl) .[name].. 1  qid 15)  Allegra 180 Mg Tabs (Fexofenadine hcl) .... Take one tab once daily 16)  Meclizine Hcl 25 Mg Chew (Meclizine hcl) .... One every 6 hours for vertigo 17)  Xyzal 5 Mg Tabs (Levocetirizine dihydrochloride) .... As needed 18)  Sudafed 30 Mg Tabs (Pseudoephedrine hcl) .... As needed 19)  Miralax Powd (Polyethylene glycol 3350) .... As per prep  instructions. 20)  Dulcolax 5 Mg Tbec (Bisacodyl) .... Day before procedure take 2 at 3pm and 2 at 8pm. 21)  Metoclopramide Hcl 10 Mg Tabs (Metoclopramide hcl) .... As per prep instructions.  Other Orders: TLB-A1C / Hgb A1C (Glycohemoglobin) (83036-A1C) TLB-Sedimentation Rate (ESR) (85652-ESR)  Patient Instructions: 1)  call if symptoms become  worse or report to the emergency room if shortness of breath is severe 2)  Limit your Sodium (Salt). 3)  Please schedule a follow-up appointment in 1 month. Prescriptions: BD DISP NEEDLES 30G X 1/2"  MISC (NEEDLE (DISP)) as directed  #90 x 6   Entered and Authorized by:   Marletta Lor  MD   Signed by:   Marletta Lor  MD on 03/09/2009   Method used:   Print then Give to Patient   RxID:   [id number] LANTUS SOLOSTAR 100 UNIT/ML SOLN (INSULIN GLARGINE) 64 units at bedtime, dispense 1 day supply  #1 x 6   Entered and Authorized by:   Marletta Lor  MD   Signed by:   Marletta Lor  MD on 03/09/2009   Method used:   Print then Give to Patient   RxID:   [id number] ALPRAZOLAM 0.5 MG  TBDP (ALPRAZOLAM) 1 once daily prn  #50 x 3   Entered and Authorized by:   Marletta Lor  MD   Signed by:    Marletta Lor  MD on 03/09/2009   Method used:   Print then Give to Patient   RxID:   [id number] FREESTYLE LITE TEST   STRP (GLUCOSE BLOOD) as directed  #100 x 6   Entered and Authorized by:   Marletta Lor  MD   Signed by:   Marletta Lor  MD on 03/09/2009   Method used:   Print then Give to Patient   RxID:   [id number] ISOSORBIDE MONONITRATE CR 60 MG  TB24 (ISOSORBIDE MONONITRATE) 1/2 qam  #90 Tablet x 4   Entered and Authorized by:   Marletta Lor  MD   Signed by:   Marletta Lor  MD on 03/09/2009   Method used:   Print then Give to Patient   RxID:   [id number] NOVOLOG FLEXPEN 100 UNIT/ML  SOLN (INSULIN ASPART) 16  u with meals  #3 vials x 6   Entered and Authorized by:   Marletta Lor  MD   Signed by:   Marletta Lor  MD on 03/09/2009   Method used:   Print then Give to Patient   RxID:   [id number] AMARYL 4 MG  TABS (GLIMEPIRIDE) Take 1 tablet by mouth once a day  #90 x 6   Entered and Authorized by:   Marletta Lor  MD   Signed by:   Marletta Lor  MD on 03/09/2009   Method used:   Print then Give to Patient   RxID:   [id number] PROZAC 40 MG  CAPS (FLUOXETINE HCL) Take 1 tablet by mouth once a day  #90 x 6   Entered and Authorized by:   Marletta Lor  MD   Signed by:   Marletta Lor  MD on 03/09/2009   Method used:   Print then Give to Patient   RxID:   [id number] METOPROLOL SUCCINATE 50 MG  TB24 (METOPROLOL SUCCINATE) 1/2 tab two times a day  #90 x 6   Entered and Authorized by:   Marletta Lor  MD   Signed by:   Marletta Lor  MD on 03/09/2009   Method used:   Print then Give to Patient   RxID:   [id number] METFORMIN HCL 1000 MG  TABS (METFORMIN HCL) Take 1 tablet by mouth two times a day  #180 x 6   Entered and Authorized by:   Marletta Lor  MD   Signed by:   Marletta Lor  MD on 03/09/2009   Method used:   Print then Give to Patient   RxID:    [id number]

## 2010-06-09 NOTE — Assessment & Plan Note (Signed)
Summary: abd pain/njr   Vital Signs:  Patient Profile:   76 Years Old Female Temp:     98.2 degrees F oral BP sitting:   134 / 84  Vitals Entered By: Chipper Oman, RN (December 03, 2006 2:55 PM)               Chief Complaint:  c/o stomach pain; wants ref to GI. FBS 121 @home ..  History of Present Illness: 76 year old female history type 2 diabetes, CAD, morbid obesity.  She states her fasting blood sugars well controlled, although hemoglobin A1c remains greater than 8.  over the past two months.  Her diet has been much improved. Her main complaint is episodes of nocturnal weakness, diaphoresis, excess hunger, nausea.  she sometimes experiences these episodes during the day, and it seemed to be improved by eating  Her cardiac status has been stable    Past Medical History:    COPD    Diabetes mellitus, type II    Diverticulitis, hx of    Hyperlipidemia    Hypertension   Family History:    Reviewed history and no changes required:  Social History:    Reviewed history and no changes required:    Review of Systems       The patient complains of anorexia and abdominal pain.         also describes some abdominal pain .  Alternating bowel habits   Physical Exam  General:     overweight-appearing.   Head:     Normocephalic and atraumatic without obvious abnormalities. No apparent alopecia or balding. Mouth:     Oral mucosa and oropharynx without lesions or exudates.  Teeth in good repair. Neck:     No deformities, masses, or tenderness noted. Lungs:     Normal respiratory effort, chest expands symmetrically. Lungs are clear to auscultation, no crackles or wheezes. Heart:     Normal rate and regular rhythm. S1 and S2 normal without gallop, murmur, click, rub or other extra sounds. Abdomen:     Bowel sounds positive,abdomen soft and non-tender without masses, organomegaly or hernias noted.    Impression & Recommendations:  Problem # 1:  DM (ICD-250.00)  suboptimal control face is agreeable to starting mealtime insulin.  Will start on NovoLog Flex pen 10 units prior to each meal.  Recheck in 4 weeks.  Patient may have a component of diabetic gastroparesis.  Will place on Prilosec, as well as Reglan at bedtime  Problem # 2:  HYPERLIPIDEMIA (ICD-272.4)  Problem # 3:  HYPERTENSION (ICD-401.9)   Patient Instructions: 1)  Please schedule a follow-up appointment in 1 month. 2)  Avoid foods high in acid (tomatoes, citrus juices, spicy foods). Avoid eating within two hours of lying down or before exercising. Do not over eat; try smaller more frequent meals. Elevate head of bed twelve inches when sleeping.

## 2010-06-09 NOTE — Progress Notes (Signed)
Summary: Received order from [facility]  Phone Note Other Incoming   Caller: Amy : [facility] Summary of Call: Amy called to comfirm that we had received order. called at [phone] ext [phone]. told her we received order but Dr. Raliegh Ip is out of office til monday. Initial call taken by: Clearnce Sorrel CMA,  December 14, 2009 4:37 PM

## 2010-06-09 NOTE — Assessment & Plan Note (Signed)
Summary: swelling chest congestion/mhf   Vital Signs:  Patient profile:   76 year old female O2 Sat:      98 % Temp:     97.8 degrees F oral BP sitting:   132 / 80  (left arm) Cuff size:   regular  Vitals Entered By: Chipper Oman, RN (July 28, 2008 10:48 AM) Is Patient Diabetic? Yes   History of Present Illness: 76 year old patient with history of COPD, who states that she has had increasing dyspnea on exertion with wheezing for two or 3 months.  Also describes a similar history of sputum production.  She describes cough, chest congestion, yielding green sputum.  She has diabetes, which has been stable.  Also, she has a history of hypertension, and dyslipidemia  Allergies: 1)  ! Sulfa 2)  ! Codeine  Past History:  Past Medical History:    COPD    Diabetes mellitus, type II    Diverticulitis, hx of    Hyperlipidemia    Hypertension    Coronary artery disease    Urinary incontinence    insomnia    Osteoarthritis    morbid obesity    obstructive sleep apnea (08/29/2007)  Family History:    the patient's mother died of breast cancer    father history of arthritis, hypertension, and throat cancer    Four sisters; history of obesity, arthritis (08/29/2007)  Family History:    Reviewed history from 08/29/2007 and no changes required:       the patient's mother died of breast cancer       father history of arthritis, hypertension, and throat cancer              Four sisters; history of obesity, arthritis  Review of Systems       The patient complains of dyspnea on exertion and prolonged cough.  The patient denies anorexia, fever, weight loss, weight gain, vision loss, decreased hearing, hoarseness, chest pain, syncope, peripheral edema, headaches, hemoptysis, abdominal pain, melena, hematochezia, severe indigestion/heartburn, hematuria, incontinence, genital sores, muscle weakness, suspicious skin lesions, transient blindness, difficulty walking, depression, unusual  weight change, abnormal bleeding, enlarged lymph nodes, angioedema, and breast masses.    Physical Exam  General:  overweight-appearing.   Head:  Normocephalic and atraumatic without obvious abnormalities. No apparent alopecia or balding. Eyes:  No corneal or conjunctival inflammation noted. EOMI. Perrla. Funduscopic exam benign, without hemorrhages, exudates or papilledema. Vision grossly normal. Ears:  External ear exam shows no significant lesions or deformities.  Otoscopic examination reveals clear canals, tympanic membranes are intact bilaterally without bulging, retraction, inflammation or discharge. Hearing is grossly normal bilaterally. Mouth:  Oral mucosa and oropharynx without lesions or exudates.  Teeth in good repair. Neck:  No deformities, masses, or tenderness noted. Lungs:  Normal respiratory effort, chest expands symmetrically. Lungs are clear to auscultation, no crackles or wheezes.  O2 saturation 98% Heart:  Normal rate and regular rhythm. S1 and S2 normal without gallop, murmur, click, rub or other extra sounds. Abdomen:  Bowel sounds positive,abdomen soft and non-tender without masses, organomegaly or hernias noted. Msk:  No deformity or scoliosis noted of thoracic or lumbar spine.   Extremities:  trace left pedal edema.     Impression & Recommendations:  Problem # 1:  CORONARY ARTERY DISEASE (ICD-414.00)  Her updated medication list for this problem includes:    Metoprolol Succinate 50 Mg Tb24 (Metoprolol succinate) .[name]... 1/2 tab two times a day    Aspirin 81 Mg  Tbec (Aspirin) .[name]... Take 1 tablet by mouth once a day    Isosorbide Mononitrate Cr 60 Mg Tb24 (Isosorbide mononitrate) .[name]... 1/2 qam  Orders: TLB-BNP (B-Natriuretic Peptide) (83880-BNPR)  Problem # 2:  HYPERTENSION (ICD-401.9)  Her updated medication list for this problem includes:    Metoprolol Succinate 50 Mg Tb24 (Metoprolol succinate) .[name]... 1/2 tab two times a day  Problem # 3:  COPD (ICD-496)   Her updated medication list for this problem includes:    Proair Hfa 108 (90 Base) Mcg/act Aers (Albuterol sulfate) .[name]..[name] Two inhalations  every 6 hours as needed for shortness of breath will check a BNP' this most likely represents acute bronchitis.  Will treat with bronchodilators and antibiotic therapy and observed  Orders: TLB-BNP (B-Natriuretic Peptide) (83880-BNPR)  Complete Medication List: 1)  Metformin Hcl 1000 Mg Tabs (Metformin hcl) .... Take 1 tablet by mouth two times a day 2)  Metoprolol Succinate 50 Mg Tb24 (Metoprolol succinate) .... 1/2 tab two times a day 3)  Prozac 40 Mg Caps (Fluoxetine hcl) .... Take 1 tablet by mouth once a day 4)  Amaryl 4 Mg Tabs (Glimepiride) .... Take 1 tablet by mouth once a day 5)  Lantus 100 Unit/ml Soln (Insulin glargine) .... 60 units at bedtime 6)  Aspirin 81 Mg Tbec (Aspirin) .... Take 1 tablet by mouth once a day 7)  Novolog Flexpen 100 Unit/ml Soln (Insulin aspart) .[name].. 10 u with meals 8)  Freestyle Lite Test Strp (Glucose blood) .... As directed 9)  Temazepam 15 Mg Caps (Temazepam) .... One at bedtime as needed 10)  Isosorbide Mononitrate Cr 60 Mg Tb24 (Isosorbide mononitrate) .... 1/2 qam 11)  Anti-fungal 1 % Powd (Tolnaftate) .... Use  twice daily 12)  Alprazolam 0.5 Mg Tbdp (Alprazolam) .[name].. 1 once daily prn 13)  Tramadol Hcl 50 Mg Tabs (Tramadol hcl) .... One every 6 hours as needed for pain 14)  Lantus Solostar 100 Unit/ml Soln (Insulin glargine) .... 64 units at bedtime 15)  Bd Disp Needles 30g X 1/2" Misc (Needle (disp)) .... As directed 16)  Fexofenadine Hcl 180 Mg Tabs (Fexofenadine hcl) .... One daily 17)  Proair Hfa 108 (90 Base) Mcg/act Aers (Albuterol sulfate) .... Two inhalations  every 6 hours as needed for shortness of breath 18)  Doxycycline Hyclate 100 Mg Caps (Doxycycline hyclate) .... One twice daily  Other Orders: Venipuncture [id number]) TLB-A1C / Hgb A1C (Glycohemoglobin) (83036-A1C)  Patient Instructions: 1)   Get plenty of rest, drink lots of clear liquids, and use Tylenol or Ibuprofen for fever and comfort. Return in 7-10 days if you're not better:sooner if you're feeling worse. 2)  Take your antibiotic as prescribed until ALL of it is gone, but stop if you develop a rash or swelling and contact our office as soon as possible. Prescriptions: DOXYCYCLINE HYCLATE 100 MG CAPS (DOXYCYCLINE HYCLATE) one twice daily  #20 x 0   Entered and Authorized by:   Marletta Lor  MD   Signed by:   Marletta Lor  MD on 07/28/2008   Method used:   Print then Give to Patient   RxID:   [id number] PROAIR HFA 108 (90 BASE) MCG/ACT AERS (ALBUTEROL SULFATE) two inhalations  every 6 hours as needed for shortness of breath  #1 x 6   Entered and Authorized by:   Marletta Lor  MD   Signed by:   Marletta Lor  MD on 07/28/2008   Method used:   Print then Give to Patient  [id number]   Appended Document: swelling chest congestion/mhf increase mealtime insulin by 4 units each meal

## 2010-06-09 NOTE — Progress Notes (Signed)
Summary: refill  Phone Note Call from Patient Call back at Home Phone [phone]   Caller: Patient Call For: Marletta Lor  MD Summary of Call: generic xanax 0.5mg  call into [facility] [id number] Initial call taken by: Glo Herring,  January 15, 2009 10:08 AM  Follow-up for Phone Call        Rx Called In Follow-up by: Chipper Oman, RN,  January 15, 2009 10:53 AM    Prescriptions: ALPRAZOLAM 0.5 MG  TBDP (ALPRAZOLAM) 1 once daily prn  #50 x 3   Entered by:   Chipper Oman, RN   Authorized by:   Marletta Lor  MD   Signed by:   Chipper Oman, RN on 01/15/2009   Method used:   Historical   RxIDZM:[id number]

## 2010-06-09 NOTE — Progress Notes (Signed)
Summary: FYI - labs  Phone Note From Other Clinic   Summary of Call: [facility] calling with lab results BUN 17 CR 1.10 Initial call taken by: Westley Hummer CMA Deborra Medina),  March 10, 2009 1:24 PM  Follow-up for Phone Call        noted Follow-up by: Marletta Lor  MD,  March 11, 2009 12:44 PM

## 2010-06-09 NOTE — Assessment & Plan Note (Signed)
Summary: flu shot/jls  Nurse Visit  Weight > 350 lbs. Yes                 Prior Medications: METFORMIN HCL 1000 MG  TABS (METFORMIN HCL) Take 1 tablet by mouth two times a day METOPROLOL SUCCINATE 50 MG  TB24 (METOPROLOL SUCCINATE) 1/2 tab two times a day PROZAC 40 MG  CAPS (FLUOXETINE HCL) Take 1 tablet by mouth once a day AMARYL 4 MG  TABS (GLIMEPIRIDE) Take 1 tablet by mouth once a day LANTUS 100 UNIT/ML  SOLN (INSULIN GLARGINE) 60 units at bedtime ASPIRIN 81 MG  TBEC (ASPIRIN) Take 1 tablet by mouth once a day NOVOLOG FLEXPEN 100 UNIT/ML  SOLN (INSULIN ASPART) 10 u with meals FREESTYLE LITE TEST   STRP (GLUCOSE BLOOD) as directed TEMAZEPAM 15 MG  CAPS (TEMAZEPAM) one at bedtime as needed ISOSORBIDE MONONITRATE CR 60 MG  TB24 (ISOSORBIDE MONONITRATE) 1/2 qam ANTI-FUNGAL 1 %  POWD (TOLNAFTATE) use  twice daily ALPRAZOLAM 0.5 MG  TBDP (ALPRAZOLAM) 1 once daily prn TRAMADOL HCL 50 MG TABS (TRAMADOL HCL) one every 6 hours as needed for pain LANTUS SOLOSTAR 100 UNIT/ML SOLN (INSULIN GLARGINE) 64 units at bedtime BD DISP NEEDLES 30G X 1/2" MISC (NEEDLE (DISP)) as directed Current Allergies: ! SULFA ! CODEINE  Flu Vaccine Consent Questions     Do you have a history of severe allergic reactions to this vaccine? no    Any prior history of allergic reactions to egg and/or gelatin? no    Do you have a sensitivity to the preservative Thimersol? no    Do you have a past history of Guillan-Barre Syndrome? no    Do you currently have an acute febrile illness? no    Have you ever had a severe reaction to latex? no    Vaccine information given and explained to patient? yes    Are you currently pregnant? no    Lot Number:[id number]   Site Given  Left Deltoid IM   Orders Added: 1)  Admin 1st Vaccine [90471] 2)  Flu Vaccine 83yrs + [place].Seats    ]

## 2010-06-09 NOTE — Progress Notes (Signed)
Summary: headache  Phone Note Call from Patient   Caller: Patient Call For: Nichole Lor  MD Summary of Call: Pt has the type of headache that she has had before.  She thinks she may need to change her Prozac to a different med. Advises if she has visual , gait or speech disturbances with more severe pain.......to go straight to the ER.  Otherwise, she will see Dr. Raliegh Ip tomorrow. as scheduled. Initial call taken by: Deanna Artis CMA,  June 03, 2009 3:17 PM  Follow-up for Phone Call        ok Follow-up by: Nichole Lor  MD,  June 03, 2009 5:05 PM

## 2010-06-09 NOTE — Assessment & Plan Note (Signed)
Summary: pt is sob per joann pt decline sooner ov/njr   Vital Signs:  Patient profile:   76 year old female O2 Sat:      97 % on Room air Temp:     98.2 degrees F oral  Vitals Entered By: Cay Schillings LPN (September 13, 624THL 1:46 PM)  O2 Flow:  Room air CC: c/o SOB getting worse over last 2-3 wks , not bad today.  Is Patient Diabetic? Yes CBG Result 147  Flu Vaccine Consent Questions     Do you have a history of severe allergic reactions to this vaccine? no    Any prior history of allergic reactions to egg and/or gelatin? no    Do you have a sensitivity to the preservative Thimersol? no    Do you have a past history of Guillan-Barre Syndrome? no    Do you currently have an acute febrile illness? no    Have you ever had a severe reaction to latex? no    Vaccine information given and explained to patient? yes    Are you currently pregnant? no    Lot Number:[id number]   Exp Date:11/05/2010   Site Given  Left Deltoid IM  CC:  c/o SOB getting worse over last 2-3 wks  and not bad today. [name]  History of Present Illness: 76 year old patient has a history of insulin-dependent diabetes, morbid obesity, and polymyalgia rheumatica.  She complains of this exertion, and weakness, and general sense of unwellness.  Three months ago her hemoglobin A1c was 9.8, and sedimentation rate was approximately 100.  Her prednisone  dose was titrated and presently is on 5 mg daily.  She has treated hypertension, and dyslipidemia  Allergies: 1)  ! Sulfa 2)  ! Codeine  Past History:  Past Medical History: Reviewed history from 02/15/2009 and no changes required. COPD Diabetes mellitus, type II Diverticulitis, hx of Hyperlipidemia Hypertension Coronary artery disease Urinary incontinence insomnia Osteoarthritis morbid obesity obstructive sleep apnea polymyalgia rheumatica Anxiety Disorder Arthritis Depression Morbid obesity  Review of Systems       The patient complains of prolonged  cough, muscle weakness, difficulty walking, and depression.  The patient denies anorexia, fever, weight loss, weight gain, vision loss, decreased hearing, hoarseness, chest pain, syncope, dyspnea on exertion, peripheral edema, headaches, hemoptysis, abdominal pain, melena, hematochezia, severe indigestion/heartburn, hematuria, incontinence, genital sores, suspicious skin lesions, transient blindness, unusual weight change, abnormal bleeding, enlarged lymph nodes, angioedema, and breast masses.    Physical Exam  General:  morbidly obese.  Blood pressure 130/70, left radial artery Head:  Normocephalic and atraumatic without obvious abnormalities. No apparent alopecia or balding. Mouth:  Oral mucosa and oropharynx without lesions or exudates.  Teeth in good repair. Neck:  No deformities, masses, or tenderness noted. Lungs:  Normal respiratory effort, chest expands symmetrically. Lungs are clear to auscultation, no crackles or wheezes. O2 saturation 97% Heart:  Normal rate and regular rhythm. S1 and S2 normal without gallop, murmur, click, rub or other extra sounds. 70 pulse Abdomen:  Bowel sounds positive,abdomen soft and non-tender without masses, organomegaly or hernias noted. Extremities:  no significant edema   Impression & Recommendations:  Problem # 1:  OBESITY, MORBID (ICD-278.01)  Problem # 2:  POLYMYALGIA RHEUMATICA (ICD-725)  Orders: Sedimentation Rate, non-automated TV:[id number])  Problem # 3:  DIABETES MELLITUS, TYPE II (ICD-250.00)  Her updated medication list for this problem includes:    Metformin Hcl 1000 Mg Tabs (Metformin hcl) .[name]... Take 1 tablet by mouth two times a day  Amaryl 4 Mg Tabs (Glimepiride) .[name]... Take 1 tablet by mouth once a day    Novolog Flexpen 100 Unit/ml Soln (Insulin aspart) .[name]... 16  u with meals    Lantus Solostar 100 Unit/ml Soln (Insulin glargine) .Marland KitchenMarland KitchenMarland KitchenMarland Kitchen 64 units at bedtime, dispense 1 day supply    Lantus 100 Unit/ml Soln (Insulin glargine) .Marland KitchenMarland KitchenMarland KitchenMarland Kitchen 64  units at bedtime  Orders: Capillary Blood Glucose/CBG RC:[id number]) Venipuncture [id number]) TLB-A1C / Hgb A1C (Glycohemoglobin) (83036-A1C) Specimen Handling (99000)  Her updated medication list for this problem includes:    Metformin Hcl 1000 Mg Tabs (Metformin hcl) .[name]... Take 1 tablet by mouth two times a day    Amaryl 4 Mg Tabs (Glimepiride) .[name]... Take 1 tablet by mouth once a day    Novolog Flexpen 100 Unit/ml Soln (Insulin aspart) .[name]... 16  u with meals    Lantus Solostar 100 Unit/ml Soln (Insulin glargine) .Marland KitchenMarland KitchenMarland KitchenMarland Kitchen 64 units at bedtime, dispense 1 day supply    Lantus 100 Unit/ml Soln (Insulin glargine) .Marland KitchenMarland KitchenMarland KitchenMarland Kitchen 64 units at bedtime  Complete Medication List: 1)  Metformin Hcl 1000 Mg Tabs (Metformin hcl) .... Take 1 tablet by mouth two times a day 2)  Metoprolol Succinate 50 Mg Tb24 (Metoprolol succinate) .... 1/2 tab two times a day 3)  Prozac 40 Mg Caps (Fluoxetine hcl) .... Take 1 tablet by mouth once a day 4)  Amaryl 4 Mg Tabs (Glimepiride) .... Take 1 tablet by mouth once a day 5)  Novolog Flexpen 100 Unit/ml Soln (Insulin aspart) .[name].. 16  u with meals 6)  Freestyle Lite Test Strp (Glucose blood) .... As directed 7)  Isosorbide Mononitrate Cr 60 Mg Tb24 (Isosorbide mononitrate) .... 1/2 qam 8)  Anti-fungal 1 % Powd (Tolnaftate) .... Use  twice daily 9)  Alprazolam 0.5 Mg Tbdp (Alprazolam) .[name].. 1 once daily prn 10)  Lantus Solostar 100 Unit/ml Soln (Insulin glargine) .... 64 units at bedtime, dispense 1 day supply 11)  Bd Disp Needles 30g X 1/2" Misc (Needle (disp)) .... As directed 12)  Fexofenadine Hcl 180 Mg Tabs (Fexofenadine hcl) .... One daily 13)  Reglan 10 Mg Tabs (Metoclopramide hcl) .[name].. 1  qid 14)  Sudafed 30 Mg Tabs (Pseudoephedrine hcl) .... As needed 15)  Prednisone 5 Mg Tabs (Prednisone) .... Two daily 16)  Iron 325 (65 Fe) Mg Tabs (Ferrous sulfate) .[name].. 1 by mouth bid 17)  Lantus 100 Unit/ml Soln (Insulin glargine) .... 64 units at bedtime 18)  Budeprion Xl 150 Mg Xr24h-tab  (Bupropion hcl) .... One daily for two weeks, then two daily 19)  Meloxicam 7.5 Mg Tabs (Meloxicam) .... One daily as needed 20)  Ventolin Hfa 108 (90 Base) Mcg/act Aers (Albuterol sulfate) .... 2 inhalations q6hr as needed 21)  Meclizine Hcl 25 Mg Tabs (Meclizine hcl) .[name].. 1 by mouth q6hrs as needed vertigo  Other Orders: Admin 1st Vaccine [id number]) Flu Vaccine 72yrs + QO:[id number])  Patient Instructions: 1)  Please schedule a follow-up appointment in 3 months. 2)  Limit your Sodium (Salt). 3)  You need to lose weight. Consider a lower calorie diet and regular exercise.  4)  Check your blood sugars regularly. If your readings are usually above : or below 70 you should contact our office. 5)  It is important that your Diabetic A1c level is checked every 3 months. Prescriptions: LANTUS 100 UNIT/ML SOLN (INSULIN GLARGINE) 64 units at bedtime  #3 x 6   Entered by:   Cay Schillings LPN   Authorized by:   Marletta Lor  MD  Signed by:   Cay Schillings LPN on [id number]   Method used:   Electronically to        [facility] (retail)       [address]       [place], [place]  [address]       Ph: [phone]       Fax: [phone]   RxID:   [id number] LANTUS SOLOSTAR 100 UNIT/ML SOLN (INSULIN GLARGINE) 64 units at bedtime, dispense 1 day supply  #3 vials x 6   Entered and Authorized by:   Marletta Lor  MD   Signed by:   Marletta Lor  MD on 01/18/2010   Method used:   Electronically to        [facility] (retail)       [address]       [place], [place]  [address]       Ph: [phone]       Fax: [phone]   RxIDVH:[id number]  pt uses vials - pen rx sent by mistake - correction done. kik Laboratory Results   Blood Tests     CBG Random:: 147mg /dL SED rate: 80 mm/hr  Comments: Joyce Gross  January 18, 2010 4:10 PM

## 2010-06-09 NOTE — Miscellaneous (Signed)
  Clinical Lists Changes  Medications: Added new medication of ALPRAZOLAM 0.5 MG  TBDP (ALPRAZOLAM) 1 once daily prn - Signed Rx of ALPRAZOLAM 0.5 MG  TBDP (ALPRAZOLAM) 1 once daily prn;  #50 x 3;  Signed;  Entered by: Chipper Oman, RN;  Authorized by: Marletta Lor  MD;  Method used: Historical    Prescriptions: ALPRAZOLAM 0.5 MG  TBDP (ALPRAZOLAM) 1 once daily prn  #50 x 3   Entered by:   Chipper Oman, RN   Authorized by:   Marletta Lor  MD   Signed by:   Chipper Oman, RN on 11/22/2007   Method used:   Historical   [id number]

## 2010-06-09 NOTE — Letter (Signed)
Summary: Application for Handicapped Placard  Application for Handicapped Placard   Imported By: Laural Benes 04/08/2010 10:48:03  _____________________________________________________________________  External Attachment:    Type:   Image     Comment:   External Document

## 2010-06-09 NOTE — Progress Notes (Signed)
Summary: new rx prednisone BID   Phone Note From Pharmacy   Caller: [facility] Summary of Call: need new rx - pt states she in taking two times a day  please advise  KIK Initial call taken by: Cay Schillings LPN,  January 26, [id number] 4:10 PM  Follow-up for Phone Call        [facility] for new RX;  needs ROV  Follow-up by: Marletta Lor  MD,  June 02, 2010 5:45 PM    New/Updated Medications: PREDNISONE 5 MG TABS (PREDNISONE) 1 by mouth bid Prescriptions: PREDNISONE 5 MG TABS (PREDNISONE) 1 by mouth bid  #60 x 0   Entered by:   Cay Schillings LPN   Authorized by:   Marletta Lor  MD   Signed by:   Cay Schillings LPN on [id number]   Method used:   Faxed to ...       [facility] (retail)       [address]       [place], [place]  [address]       Ph: [phone]       Fax: [phone]   RxID:   [id number]  med list changed to two times a day - needs to ne seen per Dr. Gala Lewandowsky

## 2010-06-09 NOTE — Assessment & Plan Note (Signed)
Summary: RASH WHELPS X 1 WEEK/PS   Vital Signs:  Patient Profile:   76 Years Old Female Temp:     98.3 degrees F oral Pulse rate:   80 / minute Pulse rhythm:   regular BP sitting:   130 / 76  (left arm) Cuff size:   regular  Vitals Entered By: Chipper Oman, RN (May 14, 2008 2:41 PM) Weight > 350 lbs. Yes                 Chief Complaint:  C/o itchy whelps x 1 week. FBS 170.[name]  History of Present Illness: 76 year old patient seen today for follow-up.  She has a history of diabetes, hypertension, coronary artery disease.  Complaint today include urticaria and associated itching for the past two days.  No change in her medication or known possible allergens denies any wheezing or other complaints    Current Allergies: ! SULFA ! CODEINE      Physical Exam  General:     morbidly obese;  blood pressure low normal Head:     Normocephalic and atraumatic without obvious abnormalities. No apparent alopecia or balding. Mouth:     Oral mucosa and oropharynx without lesions or exudates.  Teeth in good repair. Neck:     No deformities, masses, or tenderness noted. Lungs:     Normal respiratory effort, chest expands symmetrically. Lungs are clear to auscultation, no crackles or wheezes. Heart:     Normal rate and regular rhythm. S1 and S2 normal without gallop, murmur, click, rub or other extra sounds. Skin:     few scattered urticarial lesions over the arms, and the upper chest    Impression & Recommendations:  Problem # 1:  URTICARIA (H3035418.9) will place on fenofexadine and clinically observed  Problem # 2:  CORONARY ARTERY DISEASE (ICD-414.00)  Her updated medication list for this problem includes:    Metoprolol Succinate 50 Mg Tb24 (Metoprolol succinate) .[name]... 1/2 tab two times a day    Aspirin 81 Mg Tbec (Aspirin) .[name]... Take 1 tablet by mouth once a day    Isosorbide Mononitrate Cr 60 Mg Tb24 (Isosorbide mononitrate) .[name]... 1/2 qam   Problem # 3:   DIABETES MELLITUS, TYPE II (ICD-250.00)  Her updated medication list for this problem includes:    Metformin Hcl 1000 Mg Tabs (Metformin hcl) .[name]... Take 1 tablet by mouth two times a day    Amaryl 4 Mg Tabs (Glimepiride) .[name]... Take 1 tablet by mouth once a day    Lantus 100 Unit/ml Soln (Insulin glargine) .Marland KitchenMarland KitchenMarland KitchenMarland Kitchen 60 units at bedtime    Aspirin 81 Mg Tbec (Aspirin) .[name]... Take 1 tablet by mouth once a day    Novolog Flexpen 100 Unit/ml Soln (Insulin aspart) .Marland KitchenMarland KitchenMarland KitchenMarland Kitchen 10 u with meals    Lantus Solostar 100 Unit/ml Soln (Insulin glargine) .Marland KitchenMarland KitchenMarland KitchenMarland Kitchen 64 units at bedtime check a hemoglobin A1c Orders: Venipuncture [id number]) TLB-A1C / Hgb A1C (Glycohemoglobin) (83036-A1C)   Complete Medication List: 1)  Metformin Hcl 1000 Mg Tabs (Metformin hcl) .... Take 1 tablet by mouth two times a day 2)  Metoprolol Succinate 50 Mg Tb24 (Metoprolol succinate) .... 1/2 tab two times a day 3)  Prozac 40 Mg Caps (Fluoxetine hcl) .... Take 1 tablet by mouth once a day 4)  Amaryl 4 Mg Tabs (Glimepiride) .... Take 1 tablet by mouth once a day 5)  Lantus 100 Unit/ml Soln (Insulin glargine) .... 60 units at bedtime 6)  Aspirin 81 Mg Tbec (Aspirin) .... Take 1 tablet  by mouth once a day 7)  Novolog Flexpen 100 Unit/ml Soln (Insulin aspart) .[name].. 10 u with meals 8)  Freestyle Lite Test Strp (Glucose blood) .... As directed 9)  Temazepam 15 Mg Caps (Temazepam) .... One at bedtime as needed 10)  Isosorbide Mononitrate Cr 60 Mg Tb24 (Isosorbide mononitrate) .... 1/2 qam 11)  Anti-fungal 1 % Powd (Tolnaftate) .... Use  twice daily 12)  Alprazolam 0.5 Mg Tbdp (Alprazolam) .[name].. 1 once daily prn 13)  Tramadol Hcl 50 Mg Tabs (Tramadol hcl) .... One every 6 hours as needed for pain 14)  Lantus Solostar 100 Unit/ml Soln (Insulin glargine) .... 64 units at bedtime 15)  Bd Disp Needles 30g X 1/2" Misc (Needle (disp)) .... As directed 16)  Fexofenadine Hcl 180 Mg Tabs (Fexofenadine hcl) .... One daily   Patient Instructions: 1)  Please  schedule a follow-up appointment in 3 months. 2)  Limit your Sodium (Salt) to less than 2 grams a day(slightly less than 1/2 a teaspoon) to prevent fluid retention, swelling, or worsening of symptoms.   Prescriptions: FEXOFENADINE HCL 180 MG TABS (FEXOFENADINE HCL) one daily  #30 x 2   Entered and Authorized by:   Marletta Lor  MD   Signed by:   Marletta Lor  MD on 05/14/2008   Method used:   Electronically to        [facility] (retail)       [address]       [place], [place]  [address]       Ph: [phone]       Fax: [phone]   [id number] AMARYL 4 MG  TABS (GLIMEPIRIDE) Take 1 tablet by mouth once a day  #90 x 6   Entered and Authorized by:   Marletta Lor  MD   Signed by:   Marletta Lor  MD on 05/14/2008   Method used:   Electronically to        [facility] (retail)       [address]       [place], [place]  [address]       Ph: [phone]       Fax: [phone]   RxID:   [id number] METOPROLOL SUCCINATE 50 MG  TB24 (METOPROLOL SUCCINATE) 1/2 tab two times a day  #90 x 6   Entered and Authorized by:   Marletta Lor  MD   Signed by:   Marletta Lor  MD on 05/14/2008   Method used:   Electronically to        [facility] (retail)       [place], [place]  [address]       Ph: [phone]       Fax: [phone]   RxID:   [id number] NOVOLOG FLEXPEN 100 UNIT/ML  SOLN (INSULIN ASPART) 10 u with meals  #3 x 6   Entered and Authorized by:   Marletta Lor  MD   Signed by:   Marletta Lor  MD on 05/14/2008   Method used:   Print then Give to Patient   RxID:   [id number] FEXOFENADINE HCL 180 MG TABS (FEXOFENADINE HCL) one daily  #30 x 0   Entered and Authorized by:   Marletta Lor  MD   Signed by:   Marletta Lor  MD on 05/14/2008   Method used:    Print then Give to Patient   RxID:   [id number] BD DISP NEEDLES 30G X 1/2" MISC (NEEDLE (DISP)) as directed  #90 x 6   Entered and Authorized by:   Marletta Lor  MD   Signed by:   Marletta Lor  MD on 05/14/2008   Method used:   Print then Give to Patient   RxID:   [id number] LANTUS SOLOSTAR 100 UNIT/ML SOLN (INSULIN GLARGINE) 64 units at bedtime  #3 x 6   Entered and Authorized by:   Marletta Lor  MD   Signed by:   Marletta Lor  MD on 05/14/2008   Method used:   Print then Give to Patient   RxID:   [id number] ALPRAZOLAM 0.5 MG  TBDP (ALPRAZOLAM) 1 once daily prn  #50 x 3   Entered and Authorized by:   Marletta Lor  MD   Signed by:   Marletta Lor  MD on 05/14/2008   Method used:   Print then Give to Patient   RxID:   [id number] ISOSORBIDE MONONITRATE CR 60 MG  TB24 (ISOSORBIDE MONONITRATE) 1/2 qam  #90 x 6   Entered and Authorized by:   Marletta Lor  MD   Signed by:   Marletta Lor  MD on 05/14/2008   Method used:   Print then Give to Patient   RxID:   [id number] FREESTYLE LITE TEST   STRP (GLUCOSE BLOOD) as directed  #100 x 6   Entered and Authorized by:   Marletta Lor  MD   Signed by:   Marletta Lor  MD on 05/14/2008   Method used:   Print then Give to Patient   RxID:   [id number] AMARYL 4 MG  TABS (GLIMEPIRIDE) Take 1 tablet by mouth once a day  #90 x 6   Entered and Authorized by:   Marletta Lor  MD   Signed by:   Marletta Lor  MD on 05/14/2008   Method used:   Print then Give to Patient   [id number] PROZAC 40 MG  CAPS (FLUOXETINE HCL) Take 1 tablet by mouth once a day  #90 x 6   Entered and Authorized by:   Marletta Lor  MD   Signed by:   Marletta Lor  MD on 05/14/2008   Method used:   Print then Give to Patient   RxID:   [id number] METOPROLOL SUCCINATE 50 MG  TB24 (METOPROLOL SUCCINATE) 1/2 tab two times a day  #90 x 6    Entered and Authorized by:   Marletta Lor  MD   Signed by:   Marletta Lor  MD on 05/14/2008   Method used:   Print then Give to Patient   RxID:   [id number] METFORMIN HCL 1000 MG  TABS (METFORMIN HCL) Take 1 tablet by mouth two times a day  #180 x 6   Entered and Authorized by:   Marletta Lor  MD   Signed by:   Marletta Lor  MD on 05/14/2008   Method used:   Print then Give to Patient   RxID:   [id number]  ]  Appended Document: Orders Update    Clinical Lists Changes  Orders: Added new Service order of Est. Patient Level III [id number]) - Signed

## 2010-06-09 NOTE — Letter (Signed)
Summary: Generic Letter  [facility]   [place], [place] [address]   Phone: [phone]  Fax: [phone]    01/22/2009  IRISH KUPKA [address] [place], [place]  [address]  Dear Ms. Carlis Abbott,           Sincerely,   Bluford Kaufmann  MD

## 2010-06-09 NOTE — Progress Notes (Signed)
Summary: REFILL  Phone Note From Pharmacy Call back at [phone]   Caller: [facility] Call For: [name]  Summary of Call: IMDUR 30MG  ER TAB KEY#90 TAKE 1 TAB BY MOUTH EVERY MORNING  LAST FILLED 11-15-06  PATIENT WOULD LIKE A 90 DAY SUPPLY   TOPROL XL 50 MG ER TAB SAND #90 TAKE 1 TAB BY MOUTH ONE TIME DAILY LAST FILLED 10-19-06 Initial call taken by: Alvin Critchley,  February 14, 2007 4:11 PM  Follow-up for Phone Call        Pharmacist called Follow-up by: Chipper Oman, RN,  February 14, 2007 4:26 PM      Prescriptions: ISOSORBIDE DINITRATE 30 MG  TABS (ISOSORBIDE DINITRATE) Take 1 tablet by mouth once a day in the mornings  #90 x 3   Entered by:   Chipper Oman, RN   Authorized by:   Marletta Lor  MD   Signed by:   Chipper Oman, RN on 02/14/2007   Method used:   Historical   [id number] METOPROLOL SUCCINATE 50 MG  TB24 (METOPROLOL SUCCINATE) 1/2 tab two times a day  #90 x 3   Entered by:   Chipper Oman, RN   Authorized by:   Marletta Lor  MD   Signed by:   Chipper Oman, RN on 02/14/2007   Method used:   Historical   [id number]

## 2010-06-09 NOTE — Assessment & Plan Note (Signed)
Summary: ha X 1wk/njr   Vital Signs:  Patient profile:   76 year old female Temp:     98.5 degrees F oral BP sitting:   124 / 60  (left arm) Cuff size:   regular  Vitals Entered By: Chipper Oman, RN (June 04, 2009 11:33 AM) CC: C/o depression Is Patient Diabetic? Yes   CC:  C/o depression.  History of Present Illness: 76 year old patient who is seen today for follow-up of her diabetes, morbid obesity, and hypertension.  She has a long history of depression and has been on Prozac 40 mg daily.  Her chief complaint today is worsening depression.  She states that she is very depressed and all she  wants to do is eat and sleep.  Her diabetic status has been stable.  She does have a history of PMR and prednisone.  Treatment has been tapered and discontinued.    Allergies: 1)  ! Sulfa 2)  ! Codeine  Past History:  Past Medical History: Reviewed history from 02/15/2009 and no changes required. COPD Diabetes mellitus, type II Diverticulitis, hx of Hyperlipidemia Hypertension Coronary artery disease Urinary incontinence insomnia Osteoarthritis morbid obesity obstructive sleep apnea polymyalgia rheumatica Anxiety Disorder Arthritis Depression Morbid obesity  Past Surgical History: cath 10/03 CABG Appendectomy status post gastric bypass 1977, with reversal in 1979  Review of Systems       The patient complains of weight gain, difficulty walking, and depression.  The patient denies anorexia, fever, weight loss, vision loss, decreased hearing, hoarseness, chest pain, syncope, dyspnea on exertion, peripheral edema, prolonged cough, headaches, hemoptysis, abdominal pain, melena, hematochezia, severe indigestion/heartburn, hematuria, incontinence, genital sores, muscle weakness, suspicious skin lesions, transient blindness, unusual weight change, abnormal bleeding, enlarged lymph nodes, angioedema, and breast masses.    Physical Exam  General:  morbidly obese  wheelchair-bound, normal.  Blood pressure; alert, and appropriate Head:  Normocephalic and atraumatic without obvious abnormalities. No apparent alopecia or balding. Mouth:  Oral mucosa and oropharynx without lesions or exudates.  Teeth in good repair. Neck:  No deformities, masses, or tenderness noted. Lungs:  Normal respiratory effort, chest expands symmetrically. Lungs are clear to auscultation, no crackles or wheezes. Heart:  Normal rate and regular rhythm. S1 and S2 normal without gallop, murmur, click, rub or other extra sounds.   Impression & Recommendations:  Problem # 1:  OBESITY, MORBID (ICD-278.01)  Problem # 2:  POLYMYALGIA RHEUMATICA (L6038910)  Orders: TLB-Sedimentation Rate (ESR) (85652-ESR)  Problem # 3:  DIABETES MELLITUS, TYPE II (ICD-250.00)  Her updated medication list for this problem includes:    Metformin Hcl 1000 Mg Tabs (Metformin hcl) .[name]... Take 1 tablet by mouth two times a day    Amaryl 4 Mg Tabs (Glimepiride) .[name]... Take 1 tablet by mouth once a day    Novolog Flexpen 100 Unit/ml Soln (Insulin aspart) .[name]... 16  u with meals    Lantus Solostar 100 Unit/ml Soln (Insulin glargine) .Marland KitchenMarland KitchenMarland KitchenMarland Kitchen 64 units at bedtime, dispense 1 day supply    Lantus 100 Unit/ml Soln (Insulin glargine) .Marland KitchenMarland KitchenMarland KitchenMarland Kitchen 64 units at bedtime  Orders: Prescription Created Electronically [phone]) Venipuncture [id number]) TLB-A1C / Hgb A1C (Glycohemoglobin) (83036-A1C)  Problem # 4:  DEPRESSIVE DISORDER (ICD-311)  Her updated medication list for this problem includes:    Prozac 40 Mg Caps (Fluoxetine hcl) .[name]... Take 1 tablet by mouth once a day    Alprazolam 0.5 Mg Tbdp (Alprazolam) .[name]... 1 once daily prn    Budeprion Xl 150 Mg Xr24h-tab (Bupropion hcl) ..... One  daily for two weeks, then two daily a psychiatric referral will be obtained  Orders: Psychiatric Referral (Psych)  Complete Medication List: 1)  Metformin Hcl 1000 Mg Tabs (Metformin hcl) .... Take 1 tablet by mouth two times a day 2)   Metoprolol Succinate 50 Mg Tb24 (Metoprolol succinate) .... 1/2 tab two times a day 3)  Prozac 40 Mg Caps (Fluoxetine hcl) .... Take 1 tablet by mouth once a day 4)  Amaryl 4 Mg Tabs (Glimepiride) .... Take 1 tablet by mouth once a day 5)  Novolog Flexpen 100 Unit/ml Soln (Insulin aspart) .[name].. 16  u with meals 6)  Freestyle Lite Test Strp (Glucose blood) .... As directed 7)  Isosorbide Mononitrate Cr 60 Mg Tb24 (Isosorbide mononitrate) .... 1/2 qam 8)  Anti-fungal 1 % Powd (Tolnaftate) .... Use  twice daily 9)  Alprazolam 0.5 Mg Tbdp (Alprazolam) .[name].. 1 once daily prn 10)  Lantus Solostar 100 Unit/ml Soln (Insulin glargine) .... 64 units at bedtime, dispense 1 day supply 11)  Bd Disp Needles 30g X 1/2" Misc (Needle (disp)) .... As directed 12)  Fexofenadine Hcl 180 Mg Tabs (Fexofenadine hcl) .... One daily 13)  Proair Hfa 108 (90 Base) Mcg/act Aers (Albuterol sulfate) .... Two inhalations  every 6 hours as needed for shortness of breath 14)  Reglan 10 Mg Tabs (Metoclopramide hcl) .[name].. 1  qid 15)  Allegra 180 Mg Tabs (Fexofenadine hcl) .... Take one tab once daily 16)  Meclizine Hcl 25 Mg Chew (Meclizine hcl) .... One every 6 hours for vertigo 17)  Xyzal 5 Mg Tabs (Levocetirizine dihydrochloride) .... As needed 18)  Sudafed 30 Mg Tabs (Pseudoephedrine hcl) .... As needed 19)  Prednisone 5 Mg Tabs (Prednisone) .[name].. 1 once daily 20)  Iron 325 (65 Fe) Mg Tabs (Ferrous sulfate) .[name].. 1 by mouth bid 21)  Lantus 100 Unit/ml Soln (Insulin glargine) .... 64 units at bedtime 22)  Budeprion Xl 150 Mg Xr24h-tab (Bupropion hcl) .... One daily for two weeks, then two daily  Patient Instructions: 1)  Please schedule a follow-up appointment as needed. 2)  psychiatry follow-up as scheduled Prescriptions: BUDEPRION XL 150 MG XR24H-TAB (BUPROPION HCL) one daily for two weeks, then two daily  #90 x 0   Entered and Authorized by:   Marletta Lor  MD   Signed by:   Marletta Lor  MD on 06/04/2009    Method used:   Print then Give to Patient   RxID:   [id number] BUDEPRION XL 150 MG XR24H-TAB (BUPROPION HCL) one daily for two weeks, then two daily  #90 x 0   Entered and Authorized by:   Marletta Lor  MD   Signed by:   Marletta Lor  MD on 06/04/2009   Method used:   Electronically to        [facility] (retail)       [address]       [place], [place]  [address]       Ph: [phone]       Fax: [phone]   RxID:   [id number]

## 2010-06-09 NOTE — Progress Notes (Signed)
Summary: refill alprazolam  Phone Note Refill Request Message from:  Fax from Pharmacy on November 23, 2009 1:30 PM  Refills Requested: Medication #1:  ALPRAZOLAM 0.5 MG  TBDP 1 once daily prn   Last Refilled: 05/10/2009 [facility]    [id number]   Method Requested: Fax to [facility] Initial call taken by: Cay Schillings LPN,  July 19, 624THL [id number] PM    Prescriptions: ALPRAZOLAM 0.5 MG  TBDP (ALPRAZOLAM) 1 once daily prn  #50 x 3   Entered by:   Cay Schillings LPN   Authorized by:   Marletta Lor  MD   Signed by:   Cay Schillings LPN on [phone]   Method used:   Historical   [id number]  [facility] KIK

## 2010-06-09 NOTE — Assessment & Plan Note (Signed)
Summary: flu shot/njr  Nurse Visit  Weight > 350 lbs. Yes                 Prior Medications: METFORMIN HCL 1000 MG  TABS (METFORMIN HCL) Take 1 tablet by mouth two times a day METOPROLOL SUCCINATE 50 MG  TB24 (METOPROLOL SUCCINATE) 1/2 tab two times a day PROZAC 40 MG  CAPS (FLUOXETINE HCL) Take 1 tablet by mouth once a day AMARYL 4 MG  TABS (GLIMEPIRIDE) Take 1 tablet by mouth once a day LANTUS 100 UNIT/ML  SOLN (INSULIN GLARGINE) 50 units at bedtime ASPIRIN 81 MG  TBEC (ASPIRIN) Take 1 tablet by mouth once a day LIPITOR 10 MG  TABS (ATORVASTATIN CALCIUM) Take 1 tablet by mouth once a day AVAPRO 150 MG  TABS (IRBESARTAN)  ISOSORBIDE DINITRATE 30 MG  TABS (ISOSORBIDE DINITRATE) Take 1 tablet by mouth once a day in the mornings PRILOSEC 20 MG  CPDR (OMEPRAZOLE) 1 once daily REGLAN 10 MG  TABS (METOCLOPRAMIDE HCL) 1 at bedtime MELOXICAM 7.5 MG  TABS (MELOXICAM) Take 1 tablet by mouth two times a day FEXOFENADINE HCL 180 MG  TABS (FEXOFENADINE HCL) as needed ALPRAZOLAM 0.5 MG  TB24 (ALPRAZOLAM) as needed HYDROCODONE-ACETAMINOPHEN 5-500 MG  TABS (HYDROCODONE-ACETAMINOPHEN) as needed MECLIZINE HCL 25 MG  TABS (MECLIZINE HCL)  NOVOLOG FLEXPEN 100 UNIT/ML  SOLN (INSULIN ASPART) 10 u with meals TRAMADOL HCL 50 MG  TABS (TRAMADOL HCL) one every 6 hours for paion Current Allergies: ! SULFA ! CODEINE   Influenza Vaccine    Vaccine Type: Fluvax MCR    Given by: Allyne Gee, LPN  Flu Vaccine Consent Questions    Do you have a history of severe allergic reactions to this vaccine? no    Any prior history of allergic reactions to egg and/or gelatin? no    Do you have a sensitivity to the preservative Thimersol? no    Do you have a past history of Guillan-Barre Syndrome? no    Do you currently have an acute febrile illness? no    Have you ever had a severe reaction to latex? no    Vaccine information given and explained to patient? yes    Are you currently pregnant? no    Impression & Recommendations: lot [id number] 11/05/2007 [employer] pasteur-left deltoid IM.0.56ml   Complete Medication List: 1)  Metformin Hcl 1000 Mg Tabs (Metformin hcl) .... Take 1 tablet by mouth two times a day 2)  Metoprolol Succinate 50 Mg Tb24 (Metoprolol succinate) .... 1/2 tab two times a day 3)  Prozac 40 Mg Caps (Fluoxetine hcl) .... Take 1 tablet by mouth once a day 4)  Amaryl 4 Mg Tabs (Glimepiride) .... Take 1 tablet by mouth once a day 5)  Lantus 100 Unit/ml Soln (Insulin glargine) .... 50 units at bedtime 6)  Aspirin 81 Mg Tbec (Aspirin) .... Take 1 tablet by mouth once a day 7)  Lipitor 10 Mg Tabs (Atorvastatin calcium) .... Take 1 tablet by mouth once a day 8)  Avapro 150 Mg Tabs (Irbesartan) 9)  Isosorbide Dinitrate 30 Mg Tabs (Isosorbide dinitrate) .... Take 1 tablet by mouth once a day in the mornings 10)  Prilosec 20 Mg Cpdr (Omeprazole) .[name].. 1 once daily 11)  Reglan 10 Mg Tabs (Metoclopramide hcl) .[name].. 1 at bedtime 12)  Meloxicam 7.5 Mg Tabs (Meloxicam) .... Take 1 tablet by mouth two times a day 13)  Fexofenadine Hcl 180 Mg Tabs (Fexofenadine hcl) .... As needed 14)  Alprazolam 0.5 Mg Tb24 (  Alprazolam) .... As needed 15)  Hydrocodone-acetaminophen 5-500 Mg Tabs (Hydrocodone-acetaminophen) .... As needed 16)  Meclizine Hcl 25 Mg Tabs (Meclizine hcl) 17)  Novolog Flexpen 100 Unit/ml Soln (Insulin aspart) .[name].. 10 u with meals 18)  Tramadol Hcl 50 Mg Tabs (Tramadol hcl) .... One every 6 hours for paion   Orders Added: 1)  Influenza Vaccine MCR [00025]    ]

## 2010-06-09 NOTE — Progress Notes (Signed)
Summary:  rx for the rolling walker  Phone Note Call from Patient Call back at Home Phone [phone]   Caller: Patient---live call Summary of Call:  pt left message requesting rx for a rolling walker with the feet on it. please call [phone] to speak with Aisha. you can give a verbal order. Would like today. Previous rx was not for the right walker. Initial call taken by: Despina Arias,  February 22, 2010 1:01 PM  Follow-up for Phone Call        ok Follow-up by: Marletta Lor  MD,  February 22, 2010 4:59 PM  Additional Follow-up for Phone Call Additional follow up Details #1::        attempt to call -ans mach- left verbal order for rolling walker with feet. KIK Additional Follow-up by: Cay Schillings LPN,  October 18, 624THL 5:23 PM     Appended Document: Orders Update     Clinical Lists Changes  Orders: Added new Referral order of DME Referral (DME) - Signed

## 2010-06-09 NOTE — Assessment & Plan Note (Signed)
Summary: ear problem,rash under arms/jsl   Vital Signs:  Patient Profile:   76 Years Old Female Temp:     98.2 degrees F oral BP sitting:   160 / 86  (left arm) Cuff size:   regular  Vitals Entered By: Chipper Oman, RN (November 13, 2007 11:10 AM) Weight > 350 lbs. Yes                 Chief Complaint:  C/o R ear fluid and R axilla rash. FBS 95.  History of Present Illness: 76 year old female seen today for follow-up.  She has diabetes, hypertension.  Complaints today include a rash in the right axilla and also some popping and discomfort in the right ear.  She states her blood sugars have been under nice control    Current Allergies: ! SULFA ! CODEINE  Past Medical History:    Reviewed history from 08/29/2007 and no changes required:       COPD       Diabetes mellitus, type II       Diverticulitis, hx of       Hyperlipidemia       Hypertension       Coronary artery disease       Urinary incontinence       insomnia       Osteoarthritis       morbid obesity       obstructive sleep apnea      Physical Exam  General:     morbidly obese;wheelchair-bound Ears:      only a scanty amount of wax in both canals.  Tympanic membranes normal Mouth:     Oral mucosa and oropharynx without lesions or exudates.  Teeth in good repair. Neck:     No deformities, masses, or tenderness noted. Lungs:     Normal respiratory effort, chest expands symmetrically. Lungs are clear to auscultation, no crackles or wheezes. Heart:     Normal rate and regular rhythm. S1 and S2 normal without gallop, murmur, click, rub or other extra sounds. Skin:     erythematous hyperpigmented rash with some dryness in the right axilla    Impression & Recommendations:  Problem # 1:  FUNGAL DERMATITIS (ICD-111.9)  Her updated medication list for this problem includes:    Anti-fungal 1 % Powd (Tolnaftate) ..... Use  twice daily   Problem # 2:  HYPERTENSION (ICD-401.9)  Her updated medication  list for this problem includes:    Metoprolol Succinate 50 Mg Tb24 (Metoprolol succinate) .[name]... 1/2 tab two times a day   Complete Medication List: 1)  Metformin Hcl 1000 Mg Tabs (Metformin hcl) .... Take 1 tablet by mouth two times a day 2)  Metoprolol Succinate 50 Mg Tb24 (Metoprolol succinate) .... 1/2 tab two times a day 3)  Prozac 40 Mg Caps (Fluoxetine hcl) .... Take 1 tablet by mouth once a day 4)  Amaryl 4 Mg Tabs (Glimepiride) .... Take 1 tablet by mouth once a day 5)  Lantus 100 Unit/ml Soln (Insulin glargine) .... 60 units at bedtime 6)  Aspirin 81 Mg Tbec (Aspirin) .... Take 1 tablet by mouth once a day 7)  Novolog Flexpen 100 Unit/ml Soln (Insulin aspart) .[name].. 10 u with meals 8)  Freestyle Lite Test Strp (Glucose blood) .... As directed 9)  Temazepam 15 Mg Caps (Temazepam) .... One at bedtime as needed 10)  Isosorbide Mononitrate Cr 60 Mg Tb24 (Isosorbide mononitrate) .... 1/2 qam 11)  Anti-fungal 1 % Powd (  Tolnaftate) .... Use  twice daily  Other Orders: Venipuncture [id number]) TLB-A1C / Hgb A1C (Glycohemoglobin) (83036-A1C)   Patient Instructions: 1)  Please schedule a follow-up appointment in 3 months. 2)  Limit your Sodium (Salt). 3)  Limit your Sodium (Salt) to less than 2 grams a day(slightly less than 1/2 a teaspoon) to prevent fluid retention, swelling, or worsening of symptoms. 4)  You need to lose weight. Consider a lower calorie diet and regular exercise.  5)  Take calcium +Vitamin D daily. 6)  Check your blood sugars regularly. If your readings are usually above : or below 70 you should contact our office. 7)  It is important that your Diabetic A1c level is checked every 3 months. 8)  See your eye doctor yearly to check for diabetic eye damage.   Prescriptions: ANTI-FUNGAL 1 %  POWD (TOLNAFTATE) use  twice daily  #45 gm x 3   Entered and Authorized by:   Marletta Lor  MD   Signed by:   Marletta Lor  MD on 11/13/2007   Method used:   Print then  Give to Patient   RxID:   [id number]  ]

## 2010-06-09 NOTE — Progress Notes (Signed)
Summary: Proair not covered. Change to Ventolin  Phone Note From Pharmacy Call back at [phone] [facility]   Caller: [address] Summary of Call: [facility] called and said that Proair is not covered under pts insurance. Need to change med to Ventolin.  Initial call taken by: Braulio Bosch,  November 01, 2009 4:23 PM  Follow-up for Phone Call        OK Follow-up by: Marletta Lor  MD,  November 01, 2009 5:51 PM    New/Updated Medications: VENTOLIN HFA 108 (90 BASE) MCG/ACT AERS (ALBUTEROL SULFATE) 2 inhalations q6hr as needed Prescriptions: VENTOLIN HFA 108 (90 BASE) MCG/ACT AERS (ALBUTEROL SULFATE) 2 inhalations q6hr as needed  #1 x 6   Entered by:   Cay Schillings LPN   Authorized by:   Marletta Lor  MD   Signed by:   Cay Schillings LPN on [id number]   Method used:   Electronically to        [facility] (retail)       [address]       [place], [place]  [address]       Ph: [phone]       Fax: [phone]   [id number]  called in ok but also efilled new rx to [facility]. KIK

## 2010-06-09 NOTE — Assessment & Plan Note (Signed)
Summary: BODY PAINS//CCM   Vital Signs:  Patient profile:   76 year old female Temp:     98.0 degrees F oral BP sitting:   124 / 76  (left arm) Cuff size:   regular  Vitals Entered By: Chipper Oman, RN (October 21, 2008 10:07 AM)  CC:  C/o flesh hurting and motion sickness and ankles burn. BS 231.[name]  History of Present Illness: 76 year old female, who is seen today for follow-up.  She has type 2 diabetes and hypertension.  Her main complaint is achiness in her muscles and skin.  She does not feel this is arthritis involving joints.  Her last hemoglobin A1c8 also describes a mild positional vertigo.  Allergies: 1)  ! Sulfa 2)  ! Codeine  Past History:  Past Medical History: Reviewed history from 08/29/2007 and no changes required. COPD Diabetes mellitus, type II Diverticulitis, hx of Hyperlipidemia Hypertension Coronary artery disease Urinary incontinence insomnia Osteoarthritis morbid obesity obstructive sleep apnea  Physical Exam  General:  morbidly obese, normal blood pressure Head:  Normocephalic and atraumatic without obvious abnormalities. No apparent alopecia or balding. Eyes:  No corneal or conjunctival inflammation noted. EOMI. Perrla. Funduscopic exam benign, without hemorrhages, exudates or papilledema. Vision grossly normal. Mouth:  Oral mucosa and oropharynx without lesions or exudates.  Teeth in good repair. Neck:  No deformities, masses, or tenderness noted. Lungs:  Normal respiratory effort, chest expands symmetrically. Lungs are clear to auscultation, no crackles or wheezes. Heart:  Normal rate and regular rhythm. S1 and S2 normal without gallop, murmur, click, rub or other extra sounds. Abdomen:  Bowel sounds positive,abdomen soft and non-tender without masses, organomegaly or hernias noted. Extremities:  no edema   Impression & Recommendations:  Problem # 1:  OSTEOARTHRITIS (ICD-715.90)  The following medications were removed from the medication  list:    Tramadol Hcl 50 Mg Tabs (Tramadol hcl) ..... One every 6 hours as needed for pain Her updated medication list for this problem includes:    Aspirin 81 Mg Tbec (Aspirin) .[name]... Take 1 tablet by mouth once a day    The following medications were removed from the medication list:    Tramadol Hcl 50 Mg Tabs (Tramadol hcl) ..... One every 6 hours as needed for pain Her updated medication list for this problem includes:    Aspirin 81 Mg Tbec (Aspirin) .[name]... Take 1 tablet by mouth once a day  Orders: TLB-Sedimentation Rate (ESR) (85652-ESR)  Problem # 2:  CORONARY ARTERY DISEASE (ICD-414.00)  Her updated medication list for this problem includes:    Metoprolol Succinate 50 Mg Tb24 (Metoprolol succinate) .[name]... 1/2 tab two times a day    Aspirin 81 Mg Tbec (Aspirin) .[name]... Take 1 tablet by mouth once a day    Isosorbide Mononitrate Cr 60 Mg Tb24 (Isosorbide mononitrate) .[name]... 1/2 qam    Her updated medication list for this problem includes:    Metoprolol Succinate 50 Mg Tb24 (Metoprolol succinate) .[name]... 1/2 tab two times a day    Aspirin 81 Mg Tbec (Aspirin) .[name]... Take 1 tablet by mouth once a day    Isosorbide Mononitrate Cr 60 Mg Tb24 (Isosorbide mononitrate) .[name]... 1/2 qam  Orders: Venipuncture [id number]) TLB-BMP (Basic Metabolic Panel-BMET) (99991111) TLB-CBC Platelet - w/Differential (85025-CBCD) TLB-A1C / Hgb A1C (Glycohemoglobin) (83036-A1C)  Problem # 5:  HYPERTENSION (ICD-401.9)  Her updated medication list for this problem includes:    Metoprolol Succinate 50 Mg Tb24 (Metoprolol succinate) .[name]... 1/2 tab two times a day  Her updated  medication list for this problem includes:    Metoprolol Succinate 50 Mg Tb24 (Metoprolol succinate) .[name]... 1/2 tab two times a day  Problem # 6:  DM (ICD-250.00)  Her updated medication list for this problem includes:    Metformin Hcl 1000 Mg Tabs (Metformin hcl) .[name]... Take 1 tablet by mouth two times a day    Amaryl 4 Mg Tabs  (Glimepiride) .[name]... Take 1 tablet by mouth once a day    Lantus 100 Unit/ml Soln (Insulin glargine) .Marland KitchenMarland KitchenMarland KitchenMarland Kitchen 60 units at bedtime    Aspirin 81 Mg Tbec (Aspirin) .[name]... Take 1 tablet by mouth once a day    Novolog Flexpen 100 Unit/ml Soln (Insulin aspart) .Marland KitchenMarland KitchenMarland KitchenMarland Kitchen 104 u with meals    Lantus Solostar 100 Unit/ml Soln (Insulin glargine) .Marland KitchenMarland KitchenMarland KitchenMarland Kitchen 64 units at bedtime, dispense 1 day supply  Her updated medication list for this problem includes:    Metformin Hcl 1000 Mg Tabs (Metformin hcl) .[name]... Take 1 tablet by mouth two times a day    Amaryl 4 Mg Tabs (Glimepiride) .[name]... Take 1 tablet by mouth once a day    Lantus 100 Unit/ml Soln (Insulin glargine) .Marland KitchenMarland KitchenMarland KitchenMarland Kitchen 60 units at bedtime    Aspirin 81 Mg Tbec (Aspirin) .[name]... Take 1 tablet by mouth once a day    Novolog Flexpen 100 Unit/ml Soln (Insulin aspart) .Marland KitchenMarland KitchenMarland KitchenMarland Kitchen 104 u with meals    Lantus Solostar 100 Unit/ml Soln (Insulin glargine) .Marland KitchenMarland KitchenMarland KitchenMarland Kitchen 64 units at bedtime, dispense 1 day supply  Complete Medication List: 1)  Metformin Hcl 1000 Mg Tabs (Metformin hcl) .... Take 1 tablet by mouth two times a day 2)  Metoprolol Succinate 50 Mg Tb24 (Metoprolol succinate) .... 1/2 tab two times a day 3)  Prozac 40 Mg Caps (Fluoxetine hcl) .... Take 1 tablet by mouth once a day 4)  Amaryl 4 Mg Tabs (Glimepiride) .... Take 1 tablet by mouth once a day 5)  Lantus 100 Unit/ml Soln (Insulin glargine) .... 60 units at bedtime 6)  Aspirin 81 Mg Tbec (Aspirin) .... Take 1 tablet by mouth once a day 7)  Novolog Flexpen 100 Unit/ml Soln (Insulin aspart) .[name].. 104 u with meals 8)  Freestyle Lite Test Strp (Glucose blood) .... As directed 9)  Temazepam 15 Mg Caps (Temazepam) .... One at bedtime as needed 10)  Isosorbide Mononitrate Cr 60 Mg Tb24 (Isosorbide mononitrate) .... 1/2 qam 11)  Anti-fungal 1 % Powd (Tolnaftate) .... Use  twice daily 12)  Alprazolam 0.5 Mg Tbdp (Alprazolam) .[name].. 1 once daily prn 13)  Lantus Solostar 100 Unit/ml Soln (Insulin glargine) .... 64 units at bedtime,  dispense 1 day supply 14)  Bd Disp Needles 30g X 1/2" Misc (Needle (disp)) .... As directed 15)  Fexofenadine Hcl 180 Mg Tabs (Fexofenadine hcl) .... One daily 16)  Proair Hfa 108 (90 Base) Mcg/act Aers (Albuterol sulfate) .... Two inhalations  every 6 hours as needed for shortness of breath 17)  Reglan 10 Mg Tabs (Metoclopramide hcl) .[name].. 1  qid  Patient Instructions: 1)  Please schedule a follow-up appointment in 3 months. 2)  Limit your Sodium (Salt). 3)  You need to lose weight. Consider a lower calorie diet and regular exercise.  4)  Check your blood sugars regularly. If your readings are usually above : or below 70 you should contact our office. 5)  It is important that your Diabetic A1c level is checked every 3 months. 6)  See your eye doctor yearly to check for diabetic eye damage.

## 2010-06-09 NOTE — Progress Notes (Signed)
Summary: QUESTION ABOUT MED    Caller: Patient [phone] Summary of Call: Pt called to adv that she was prescribed a med: BUDEPRION XL 150 MG ...Marland KitchenMarland KitchenPt adv that this med is really expensive and her insurance will not pay for it... Marland KitchenPt wants to know if there is a cheaper way that she could obtain this med or if there is an equivalent to this med that the pt can be put on.Marland KitchenMarland KitchenPt adv that she called her pharmacy / ins co to see what meds they will cover and pt adv that she did and they told her that she needed to see if Dr Raliegh Ip could call the ins co and tell them that there was a medical need for this pt to be on this med...??.... Pt adv that she can be reached at [phone] with any questions or concerns.  Pt uses [facility] on [address]  Initial call taken by: Duanne Moron,  June 09, 2009 1:06 PM    called and discussed-d/c med

## 2010-06-09 NOTE — Assessment & Plan Note (Signed)
Summary: wants to talk w Dr Raliegh Ip about meds for   Vital Signs:  Patient Profile:   76 Years Old Female Temp:     98 degrees F oral BP sitting:   116 / 60  (left arm)  Vitals Entered By: Chipper Oman, RN (August 29, 2007 5:09 PM) Weight > 350 lbs. Yes             CBG Result 176     Chief Complaint:  C/o insomnia- Ambien doesn't work. C/o urinary incontinence.[name]  History of Present Illness: 76 year old patient history of type 2 diabetes coronary artery disease, hypertension.  She has a history also of morbid obesity. complaints today include urinary incontinence; historically it seems the more of a stress incontinence, but at times it does describe urgency.  Other complaints include insomnia.  She states she has had a gynecologic exam included laboratory studies recently.  She is up and track of blood sugars recently due to her glucometer that has malfunctioned    Current Allergies: ! SULFA ! CODEINE  Past Medical History:    Reviewed history from 12/03/2006 and no changes required:       COPD       Diabetes mellitus, type II       Diverticulitis, hx of       Hyperlipidemia       Hypertension       Coronary artery disease       Urinary incontinence       insomnia       Osteoarthritis       morbid obesity       obstructive sleep apnea   Family History:    the patient's mother died of breast cancer    father history of arthritis, hypertension, and throat cancer        Four sisters; history of obesity, arthritis  Social History:    Married    Review of Systems       The patient complains of weight gain and incontinence.  The patient denies chest pain, dyspnea on exhertion, peripheral edema, melena, hematochezia, and severe indigestion/heartburn.     Physical Exam  General:     morbidly obese;  blood pressure 126/64 Head:     Normocephalic and atraumatic without obvious abnormalities. No apparent alopecia or balding. Eyes:     No corneal or conjunctival  inflammation noted. EOMI. Perrla. Funduscopic exam benign, without hemorrhages, exudates or papilledema. Vision grossly normal. Mouth:     Oral mucosa and oropharynx without lesions or exudates.  Teeth in good repair. Neck:     No deformities, masses, or tenderness noted. Lungs:     Normal respiratory effort, chest expands symmetrically. Lungs are clear to auscultation, no crackles or wheezes. Heart:     Normal rate and regular rhythm. S1 and S2 normal without gallop, murmur, click, rub or other extra sounds. Abdomen:     Bowel sounds positive,abdomen soft and non-tender without masses, organomegaly or hernias noted. Msk:     No deformity or scoliosis noted of thoracic or lumbar spine.   Pulses:     R and L carotid,radial,femoral,dorsalis pedis and posterior tibial pulses are full and equal bilaterally Extremities:     No clubbing, cyanosis, edema, or deformity noted with normal full range of motion of all joints.      Impression & Recommendations:  Problem # 1:  URINARY INCONTINENCE (ICD-788.30)  Problem # 2:  HYPERTENSION (ICD-401.9)  The following medications were removed from the  medication list:    Avapro 150 Mg Tabs (Irbesartan)  Her updated medication list for this problem includes:    Metoprolol Succinate 50 Mg Tb24 (Metoprolol succinate) .[name]... 1/2 tab two times a day   Problem # 3:  isDIABETES MELLITUS, TYPE II (ICD-250.00)  The following medications were removed from the medication list:    Avapro 150 Mg Tabs (Irbesartan)  Her updated medication list for this problem includes:    Metformin Hcl 1000 Mg Tabs (Metformin hcl) .[name]... Take 1 tablet by mouth two times a day    Amaryl 4 Mg Tabs (Glimepiride) .[name]... Take 1 tablet by mouth once a day    Lantus 100 Unit/ml Soln (Insulin glargine) .Nichole KitchenMarland KitchenMarland KitchenMarland [name] 60 units at bedtime    Aspirin 81 Mg Tbec (Aspirin) .[name]... Take 1 tablet by mouth once a day    Novolog Flexpen 100 Unit/ml Soln (Insulin aspart) .Nichole KitchenMarland KitchenMarland KitchenMarland [name] 10 u with meals  Orders:  Capillary Blood Glucose (82948) Fingerstick [id number])   Problem # 4:  COPD (ICD-496)  Complete Medication List: 1)  Metformin Hcl 1000 Mg Tabs (Metformin hcl) .... Take 1 tablet by mouth two times a day 2)  Metoprolol Succinate 50 Mg Tb24 (Metoprolol succinate) .... 1/2 tab two times a day 3)  Prozac 40 Mg Caps (Fluoxetine hcl) .... Take 1 tablet by mouth once a day 4)  Amaryl 4 Mg Tabs (Glimepiride) .... Take 1 tablet by mouth once a day 5)  Lantus 100 Unit/ml Soln (Insulin glargine) .... 60 units at bedtime 6)  Aspirin 81 Mg Tbec (Aspirin) .... Take 1 tablet by mouth once a day 7)  Novolog Flexpen 100 Unit/ml Soln (Insulin aspart) .[name].. 10 u with meals 8)  Freestyle Lite Test Strp (Glucose blood) .... As directed 9)  Temazepam 15 Mg Caps (Temazepam) .... One at bedtime as needed   Patient Instructions: 1)  Limit your Sodium (Salt) to less than 2 grams a day(slightly less than 1/2 a teaspoon) to prevent fluid retention, swelling, or worsening of symptoms. 2)  Check your blood sugars regularly. If your readings are usually above : or below 70 you should contact our office. 3)  It is important that your Diabetic A1c level is checked every 3 months. 4)  See your eye doctor yearly to check for diabetic eye damage. 5)  Check your feet each night for sore areas, calluses or signs of infection.    Prescriptions: TEMAZEPAM 15 MG  CAPS (TEMAZEPAM) one at bedtime as needed  #90 x 2   Entered and Authorized by:   Marletta Lor  MD   Signed by:   Marletta Lor  MD on 08/29/2007   Method used:   Print then Give to Patient   RxID:   [id number] FREESTYLE LITE TEST   STRP (GLUCOSE BLOOD) as directed  #100 x 6   Entered and Authorized by:   Marletta Lor  MD   Signed by:   Marletta Lor  MD on 08/29/2007   Method used:   Print then Give to Patient   RxID:   [id number] NOVOLOG FLEXPEN 100 UNIT/ML  SOLN (INSULIN ASPART) 10 u with meals  #3 x 6   Entered and  Authorized by:   Marletta Lor  MD   Signed by:   Marletta Lor  MD on 08/29/2007   Method used:   Print then Give to Patient   RxID:   [id number] LANTUS 100 UNIT/ML  SOLN (INSULIN GLARGINE) 60 units at bedtime  #  3 vials x 0   Entered and Authorized by:   Marletta Lor  MD   Signed by:   Marletta Lor  MD on 08/29/2007   Method used:   Print then Give to Patient   RxID:   [id number] AMARYL 4 MG  TABS (GLIMEPIRIDE) Take 1 tablet by mouth once a day  #90 x 6   Entered and Authorized by:   Marletta Lor  MD   Signed by:   Marletta Lor  MD on 08/29/2007   Method used:   Print then Give to Patient   RxID:   [id number] PROZAC 40 MG  CAPS (FLUOXETINE HCL) Take 1 tablet by mouth once a day  #90 x 6   Entered and Authorized by:   Marletta Lor  MD   Signed by:   Marletta Lor  MD on 08/29/2007   Method used:   Print then Give to Patient   RxID:   [id number] METOPROLOL SUCCINATE 50 MG  TB24 (METOPROLOL SUCCINATE) 1/2 tab two times a day  #90 x 6   Entered and Authorized by:   Marletta Lor  MD   Signed by:   Marletta Lor  MD on 08/29/2007   Method used:   Print then Give to Patient   RxID:   [id number] METFORMIN HCL 1000 MG  TABS (METFORMIN HCL) Take 1 tablet by mouth two times a day  #180 x 6   Entered and Authorized by:   Marletta Lor  MD   Signed by:   Marletta Lor  MD on 08/29/2007   Method used:   Print then Give to Patient   RxID:   [id number]  ]

## 2010-06-09 NOTE — Progress Notes (Signed)
Summary: Schedule EGD  Medications Added IRON 325 (65 FE) MG TABS (FERROUS SULFATE) 1 by mouth BID       Phone Note Outgoing Call Call back at [facility] Phone [phone]   Call placed by: Barb Merino RN, CGRN,  March 22, 2009 10:29 AM Call placed to: Patient Summary of Call: I will call back this afternoon to arrange for EGD later in the week at the hospital.   Follow-up for Phone Call        I called and spoke with the patient and her daughter [name] (on the other phone).  Patient doesn't want to have a EGD at this time.  I did ask her to call me back when she is ready to schedule, she will start on the iron supplement as ordered. I again tried to encourage her to schedule, she wants to "think about it". Follow-up by: Barb Merino RN, [place],  March 22, 2009 1:51 PM  Additional Follow-up for Phone Call Additional follow up Details #1::        noted. She should follow-up re: anemia with her PCP and return to [place] when ready to have EGD or otherwise. Please copy PCP on this. Additional Follow-up by: Gatha Mayer MD, [place],  March 23, 2009 9:32 AM    Additional Follow-up for Phone Call Additional follow up Details #2::    I called again and spoke with Mrs Palu this am, she still is not ready to schedule an EGD.  She will call us back if she changes her mind.  She is asked to follow up with her primary care. Follow-up by: Barb Merino RN, [place],  March 23, 2009 11:15 AM  New/Updated Medications: IRON 325 (65 FE) MG TABS (FERROUS SULFATE) 1 by mouth BID

## 2010-06-09 NOTE — Letter (Signed)
Summary: [facility]  [facility]   Imported By: Laural Benes 08/31/2009 14:55:36  _____________________________________________________________________  External Attachment:    Type:   Image     Comment:   External Document

## 2010-06-09 NOTE — Progress Notes (Signed)
Summary: rash, welts on arms, chest  Phone Note Call from Patient Call back at [phone]   Caller: pt live Call For: K/ Kaemon Barnett Summary of Call: Patient has a rash arms and legs she takes a benadryl and it stops,but it comes back the next day.  She wants to see a Dr and find out what is causesing this.  sugar was 216 fasting this morning. Initial call taken by: Alinda Deem,  May 07, 2008 9:57 AM  Follow-up for Phone Call        No answer and no voice mail. Follow-up by: Deanna Artis CMA,  May 07, 2008 10:19 AM  Additional Follow-up for Phone Call Additional follow up Details #1::        Called pt again, found out she began taking Reglan 10 mg again about one week ago before meals and at HS.  She notes welts on her arms, chest and today legs in the morning.  She takes Benadryl, they go away and return next AM.  Pt began taking Reglan again because of sour stomach symptoms, had not take this med for 9 months or so.  Instructed to not take Reglan until further instruction from MD Additional Follow-up by: Nira Conn LPN,  December 31, 579FGE 10:26 AM    Additional Follow-up for Phone Call Additional follow up Details #2::    Agreed. If still having rash next week, see Dr. Burnice Logan Follow-up by: Laurey Morale MD,  May 07, 2008 10:49 AM  Additional Follow-up for Phone Call Additional follow up Details #3:: Details for Additional Follow-up Action Taken: Pt informed and she voiced her understanding. Additional Follow-up by: Nira Conn LPN,  December 31, 579FGE 11:37 AM

## 2010-06-09 NOTE — Progress Notes (Signed)
  Phone Note Call from Patient   Caller: Patient Summary of Call: Ran out of insulin, [facility] (regular pharmacy) Closed, so sending in 1 day supply to [facility]. Initial call taken by: Owens Loffler MD,  October 20, 2008 9:13 PM    New/Updated Medications: LANTUS SOLOSTAR 100 UNIT/ML SOLN (INSULIN GLARGINE) 64 units at bedtime, dispense 1 day supply   Prescriptions: LANTUS SOLOSTAR 100 UNIT/ML SOLN (INSULIN GLARGINE) 64 units at bedtime, dispense 1 day supply  #1 x 0   Entered and Authorized by:   Owens Loffler MD   Signed by:   Owens Loffler MD on 10/20/2008   Method used:   Electronically to        [facility] [phone]* (retail)       [address]       [place], [place]  [address]       Ph: [phone]       Fax: [phone]   RxID:   [id number]

## 2010-06-09 NOTE — Progress Notes (Signed)
Summary: Heat Therapy Pump System order    Caller: Nadine from [facility] [phone] Call For: [name] Summary of Call: [name] requesting follow-up information on a faxed on 11/3, refaxed on 11/9.  Pt is requesting a Heat Therapy Pump System. Fax back at [phone] Initial call taken by: Nira Conn LPN,  November 16, 579FGE 10:00 AM    Not medically indicated  Appended Document: Heat Therapy Pump System order Nadine at [facility] requested something in writing be faxed to her to document pt file.  This was done on a prescription form, Heat Therapy Pump System not medically indicated

## 2010-06-09 NOTE — Progress Notes (Signed)
  Phone Note Outgoing Call   Call placed by: Chipper Oman, RN,  June 07, 2009 12:53 PM Call placed to: Patient Summary of Call: [facility] re: condition  Follow-up for Phone Call        patient feeling better on prednisone. Follow-up by: Chipper Oman, RN,  June 08, 2009 4:35 PM  Additional Follow-up for Phone Call Additional follow up Details #1::        ROV one month  Additional Follow-up by: Marletta Lor  MD,  June 08, 2009 5:07 PM    Additional Follow-up for Phone Call Additional follow up Details #2::    notified. Follow-up by: Chipper Oman, RN,  June 09, 2009 12:04 PM

## 2010-06-09 NOTE — Miscellaneous (Signed)
  Clinical Lists Changes  Medications: Removed medication of MOBIC 7.5 MG TABS (MELOXICAM) Removed medication of METFORMIN HCL 1000 MG TABS (METFORMIN HCL) Removed medication of PROZAC 40 MG CAPS (FLUOXETINE HCL) Removed medication of METOPROLOL TARTRATE 50 MG TABS (METOPROLOL TARTRATE) Removed medication of XANAX 0.5 MG TABS (ALPRAZOLAM) Removed medication of LIPITOR 10 MG TABS (ATORVASTATIN CALCIUM) Removed medication of LANTUS FOR OPTICLIK 100 UNIT/ML SOLN (INSULIN GLARGINE) Removed medication of IMDUR 30 MG TB24 (ISOSORBIDE MONONITRATE) Removed medication of AMARYL 4 MG TABS (GLIMEPIRIDE) Allergies: Removed allergy or adverse reaction of SULFAMETHOXAZOLE (SULFAMETHOXAZOLE) Removed allergy or adverse reaction of CODEINE PHOSPHATE (CODEINE PHOSPHATE)

## 2010-06-09 NOTE — Procedures (Signed)
Summary: Instructions for procedure/[facility] (out pt)  Instructions for procedure/[facility] (out pt)   Imported By: Phillis Knack 02/18/2009 09:20:19  _____________________________________________________________________  External Attachment:    Type:   Image     Comment:   External Document

## 2010-06-09 NOTE — Assessment & Plan Note (Signed)
Summary: headache/mhf   Vital Signs:  Patient Profile:   76 Years Old Female O2 Sat:      97 % O2 treatment:    Room Air Temp:     98.5 degrees F oral Pulse rate:   88 / minute Pulse rhythm:   regular BP sitting:   140 / 82  (left arm) Cuff size:   regular  Vitals Entered By: Chipper Oman, RN (January 07, 2008 4:02 PM) Weight > 350 lbs. Yes                 Chief Complaint:  C/o headache since last Thurs- sinus pills don't help. BS 209.  History of Present Illness: 76 year old female seen today for follow-up.  She has hypertension and diabetes for the past 3 or 4 days.  She has had the headaches.  These occur in the neck and posterior scalp region and seen to be aggravated by movement of the head and neck.  No fever, stiff neck or other constitutional complaints. She has type 2 diabetes last hemoglobin A1c7.4, fasting blood sugars consistently in the 180 to 200 range    Current Allergies: ! SULFA ! CODEINE  Past Medical History:    Reviewed history from 08/29/2007 and no changes required:       COPD       Diabetes mellitus, type II       Diverticulitis, hx of       Hyperlipidemia       Hypertension       Coronary artery disease       Urinary incontinence       insomnia       Osteoarthritis       morbid obesity       obstructive sleep apnea     Review of Systems       The patient complains of headaches.     Physical Exam  General:     overweight-appearing.  wheelchair-bound Head:     Normocephalic and atraumatic without obvious abnormalities. No apparent alopecia or balding. Eyes:     No corneal or conjunctival inflammation noted. EOMI. Perrla. Funduscopic exam benign, without hemorrhages, exudates or papilledema. Vision grossly normal. Ears:     External ear exam shows no significant lesions or deformities.  Otoscopic examination reveals clear canals, tympanic membranes are intact bilaterally without bulging, retraction, inflammation or discharge.  Hearing is grossly normal bilaterally. Mouth:     Oral mucosa and oropharynx without lesions or exudates.  Teeth in good repair. Neck:     No deformities, masses, or tenderness noted. Lungs:     Normal respiratory effort, chest expands symmetrically. Lungs are clear to auscultation, no crackles or wheezes. Heart:     Normal rate and regular rhythm. S1 and S2 normal without gallop, murmur, click, rub or other extra sounds.    Impression & Recommendations:  Problem # 1:  HEADACHE (ICD-784.0)  Her updated medication list for this problem includes:    Metoprolol Succinate 50 Mg Tb24 (Metoprolol succinate) .[name]... 1/2 tab two times a day    Aspirin 81 Mg Tbec (Aspirin) .[name]... Take 1 tablet by mouth once a day    Tramadol Hcl 50 Mg Tabs (Tramadol hcl) ..... One every 6 hours as needed for pain   Problem # 2:  HYPERTENSION (ICD-401.9)  Her updated medication list for this problem includes:    Metoprolol Succinate 50 Mg Tb24 (Metoprolol succinate) .[name]... 1/2 tab two times a day  Problem # 3:  DIABETES MELLITUS, TYPE II (ICD-250.00)  Her updated medication list for this problem includes:    Metformin Hcl 1000 Mg Tabs (Metformin hcl) .[name]... Take 1 tablet by mouth two times a day    Amaryl 4 Mg Tabs (Glimepiride) .[name]... Take 1 tablet by mouth once a day    Lantus 100 Unit/ml Soln (Insulin glargine) .Marland KitchenMarland KitchenMarland KitchenMarland Kitchen 60 units at bedtime    Aspirin 81 Mg Tbec (Aspirin) .[name]... Take 1 tablet by mouth once a day    Novolog Flexpen 100 Unit/ml Soln (Insulin aspart) .Marland KitchenMarland KitchenMarland KitchenMarland Kitchen 10 u with meals    Lantus Solostar 100 Unit/ml Soln (Insulin glargine) .Marland KitchenMarland KitchenMarland KitchenMarland Kitchen 64 units at bedtime   Complete Medication List: 1)  Metformin Hcl 1000 Mg Tabs (Metformin hcl) .... Take 1 tablet by mouth two times a day 2)  Metoprolol Succinate 50 Mg Tb24 (Metoprolol succinate) .... 1/2 tab two times a day 3)  Prozac 40 Mg Caps (Fluoxetine hcl) .... Take 1 tablet by mouth once a day 4)  Amaryl 4 Mg Tabs (Glimepiride) .... Take 1 tablet by mouth  once a day 5)  Lantus 100 Unit/ml Soln (Insulin glargine) .... 60 units at bedtime 6)  Aspirin 81 Mg Tbec (Aspirin) .... Take 1 tablet by mouth once a day 7)  Novolog Flexpen 100 Unit/ml Soln (Insulin aspart) .[name].. 10 u with meals 8)  Freestyle Lite Test Strp (Glucose blood) .... As directed 9)  Temazepam 15 Mg Caps (Temazepam) .... One at bedtime as needed 10)  Isosorbide Mononitrate Cr 60 Mg Tb24 (Isosorbide mononitrate) .... 1/2 qam 11)  Anti-fungal 1 % Powd (Tolnaftate) .... Use  twice daily 12)  Alprazolam 0.5 Mg Tbdp (Alprazolam) .[name].. 1 once daily prn 13)  Tramadol Hcl 50 Mg Tabs (Tramadol hcl) .... One every 6 hours as needed for pain 14)  Lantus Solostar 100 Unit/ml Soln (Insulin glargine) .... 64 units at bedtime 15)  Bd Disp Needles 30g X 1/2" Misc (Needle (disp)) .... As directed   Patient Instructions: 1)  Please schedule a follow-up appointment in 3 months. 2)  You need to lose weight. Consider a lower calorie diet and regular exercise.  3)  Check your blood sugars regularly. If your readings are usually above : or below 70 you should contact our office. 4)  It is important that your Diabetic A1c level is checked every 3 months.   Prescriptions: BD DISP NEEDLES 30G X 1/2" MISC (NEEDLE (DISP)) as directed  #90 x 6   Entered and Authorized by:   Marletta Lor  MD   Signed by:   Marletta Lor  MD on 01/07/2008   Method used:   Print then Give to Patient   RxID:   [id number] LANTUS SOLOSTAR 100 UNIT/ML SOLN (INSULIN GLARGINE) 64 units at bedtime  #1 x 6   Entered and Authorized by:   Marletta Lor  MD   Signed by:   Marletta Lor  MD on 01/07/2008   Method used:   Print then Give to Patient   RxID:   [id number] TRAMADOL HCL 50 MG TABS (TRAMADOL HCL) one every 6 hours as needed for pain  #50 x 4   Entered and Authorized by:   Marletta Lor  MD   Signed by:   Marletta Lor  MD on 01/07/2008   Method used:   Print then Give to  Patient   RxID:   [id number]  ]

## 2010-06-09 NOTE — Assessment & Plan Note (Signed)
Summary: 1 month roa\dbb   Vital Signs:  Patient Profile:   76 Years Old Female Temp:     98.2 degrees F oral BP sitting:   110 / 62  (left arm)  Vitals Entered By: Chipper Oman, RN (December 28, 2006 3:23 PM) Weight > 350 lbs. Yes               Chief Complaint:  ROV. Can't sleep. Chest congested. FBS 168 @home ..  History of Present Illness: 76 year old female, follow-up of her diabetes.  She is a much better since starting the short acting insulin.  She has a minimal cough only new complaint today of some insomnia and  Current Allergies: ! SULFA ! CODEINE      Physical Exam  General:     overweight-appearing.  110/70 Mouth:     Oral mucosa and oropharynx without lesions or exudates.  Teeth in good repair. Neck:     No deformities, masses, or tenderness noted. Lungs:     Normal respiratory effort, chest expands symmetrically. Lungs are clear to auscultation, no crackles or wheezes. Heart:     Normal rate and regular rhythm. S1 and S2 normal without gallop, murmur, click, rub or other extra sounds.    Impression & Recommendations:  Problem # 1:  DIABETES MELLITUS, TYPE II (ICD-250.00)  Her updated medication list for this problem includes:    Metformin Hcl 1000 Mg Tabs (Metformin hcl) .[name]... Take 1 tablet by mouth two times a day    Amaryl 4 Mg Tabs (Glimepiride) .[name]... Take 1 tablet by mouth once a day    Lantus 100 Unit/ml Soln (Insulin glargine) .Marland KitchenMarland KitchenMarland KitchenMarland Kitchen 50 units at bedtime    Aspirin 81 Mg Tbec (Aspirin) .[name]... Take 1 tablet by mouth once a day    Avapro 150 Mg Tabs (Irbesartan)    Novolog Flexpen 100 Unit/ml Soln (Insulin aspart) .Marland KitchenMarland KitchenMarland KitchenMarland Kitchen 10 u with meals   Problem # 2:  HYPERTENSION (ICD-401.9)  The following medications were removed from the medication list:    Toprol Xl 50 Mg Tb24 (Metoprolol succinate)  Her updated medication list for this problem includes:    Metoprolol Succinate 50 Mg Tb24 (Metoprolol succinate) .[name]... 1/2 tab two times a day    Avapro  150 Mg Tabs (Irbesartan)   Complete Medication List: 1)  Metformin Hcl 1000 Mg Tabs (Metformin hcl) .... Take 1 tablet by mouth two times a day 2)  Metoprolol Succinate 50 Mg Tb24 (Metoprolol succinate) .... 1/2 tab two times a day 3)  Prozac 40 Mg Caps (Fluoxetine hcl) .... Take 1 tablet by mouth once a day 4)  Amaryl 4 Mg Tabs (Glimepiride) .... Take 1 tablet by mouth once a day 5)  Lantus 100 Unit/ml Soln (Insulin glargine) .... 50 units at bedtime 6)  Aspirin 81 Mg Tbec (Aspirin) .... Take 1 tablet by mouth once a day 7)  Lipitor 10 Mg Tabs (Atorvastatin calcium) .... Take 1 tablet by mouth once a day 8)  Avapro 150 Mg Tabs (Irbesartan) 9)  Isosorbide Dinitrate 30 Mg Tabs (Isosorbide dinitrate) .... Take 1 tablet by mouth once a day in the mornings 10)  Prilosec 20 Mg Cpdr (Omeprazole) .[name].. 1 once daily 11)  Reglan 10 Mg Tabs (Metoclopramide hcl) .[name].. 1 at bedtime 12)  Meloxicam 7.5 Mg Tabs (Meloxicam) .... Take 1 tablet by mouth two times a day 13)  Fexofenadine Hcl 180 Mg Tabs (Fexofenadine hcl) .... As needed 14)  Alprazolam 0.5 Mg Tb24 (Alprazolam) .... As needed 15)  Hydrocodone-acetaminophen 5-500 Mg Tabs (Hydrocodone-acetaminophen) .... As needed 16)  Meclizine Hcl 25 Mg Tabs (Meclizine hcl) 17)  Novolog Flexpen 100 Unit/ml Soln (Insulin aspart) .[name].. 10 u with meals 18)  Tramadol Hcl 50 Mg Tabs (Tramadol hcl) .... One every 6 hours for paion   Patient Instructions: 1)  Please schedule a follow-up appointment in 3 months. 2)  Limit your Sodium (Salt). 3)  You need to lose weight. Consider a lower calorie diet and regular exercise.     Prescriptions: TRAMADOL HCL 50 MG  TABS (TRAMADOL HCL) one every 6 hours for paion  #90 x 6   Entered and Authorized by:   Marletta Lor  MD   Signed by:   Marletta Lor  MD on 12/28/2006   Method used:   Print then Give to Patient   RxID:   [id number]

## 2010-06-09 NOTE — Medication Information (Signed)
Summary: Order for Diabetic Testing Supplies  Order for Diabetic Testing Supplies   Imported By: Laural Benes 12/09/2009 10:18:36  _____________________________________________________________________  External Attachment:    Type:   Image     Comment:   External Document

## 2010-06-09 NOTE — Medication Information (Signed)
Summary: 68 Order - Diabetic Supplies  38 Order - Diabetic Supplies Provider: This provider was preselected by the workflow.  Signature: The signature status of this document was preset by the workflow  Processed by [employer] Local Indexer Client @ Tuesday, March 16, 2009 2:44:18 PM using version:2010.1.2.11(2.4)   Manually Indexed By: [id number]  idlBatchDetail: [id number]   _____________________________________________________________________  External Attachment:    Type:   Image     Comment:   External Document

## 2010-06-09 NOTE — Letter (Signed)
Summary: Discharge Summary  Discharge Summary   Imported By: Jamelle Haring 09/23/2007 16:44:45  _____________________________________________________________________  External Attachment:    Type:   Image     Comment:   [facility] d/c summary

## 2010-06-09 NOTE — Progress Notes (Signed)
Summary: wgt gain  Phone Note Call from Patient   Caller: Patient Call For: Marletta Lor  MD Summary of Call: Pt is taking the prednisone, and has gained a huge amount of weight.  Asking Dr Raliegh Ip to give her a different med, and refer her to a nutritionist.  Cannot wear her clothes, and is very upset about this wgt gain.   [id number] Initial call taken by: Deanna Artis CMA,  Sep 28, 2009 3:07 PM  Follow-up for Phone Call        decrease prednisone to 5 mg Monday, Wednesday, Friday, and 2.5 mg 4 times weekly; if patient does okay on this regimen after two weeks; decrease prednisone to 2.5 mg every day; refer to nutritionist Follow-up by: Marletta Lor  MD,  Sep 28, 2009 5:24 PM  Additional Follow-up for Phone Call Additional follow up Details #1::        Pt notified and orders sent for a nutrition referral. Additional Follow-up by: Deanna Artis CMA,  Sep 29, 2009 8:08 AM

## 2010-06-09 NOTE — Progress Notes (Signed)
Summary: refill meclizine  Phone Note Refill Request Message from:  Fax from Pharmacy on November 04, 2009 12:45 PM  fax from [facility] requesting Meclizine25 mg 1 by mouth q6hrs as needed vertigo refill - This was removed from med list 2009.  please advise for refill   Method Requested: Electronic Initial call taken by: Cay Schillings LPN,  June 30, 624THL 075-GRM PM  Follow-up for Phone Call        OK  #50  RF 4 Follow-up by: Marletta Lor  MD,  November 04, 2009 1:18 PM    New/Updated Medications: MECLIZINE HCL 25 MG TABS (MECLIZINE HCL) 1 by mouth q6hrs as needed vertigo Prescriptions: MECLIZINE HCL 25 MG TABS (MECLIZINE HCL) 1 by mouth q6hrs as needed vertigo  #50 x 4   Entered by:   Cay Schillings LPN   Authorized by:   Marletta Lor  MD   Signed by:   Cay Schillings LPN on [id number]   Method used:   Electronically to        [facility]* (retail)       [address]       [place], [place]  [address]       Ph: [phone]       Fax: [phone]   RxID:   [id number]

## 2010-06-09 NOTE — Progress Notes (Signed)
  Phone Note Outgoing Call   Call placed by: Chipper Oman, RN,  March 11, 2009 12:54 PM Call placed to: Patient Summary of Call: called report- feels about the same; having a hard time doing anything- gets exhausted.    New/Updated Medications: PREDNISONE 5 MG TABS (PREDNISONE) 1 once daily Prescriptions: PREDNISONE 5 MG TABS (PREDNISONE) 1 once daily  #30 x 1   Entered by:   Chipper Oman, RN   Authorized by:   Marletta Lor  MD   Signed by:   Chipper Oman, RN on 03/11/2009   Method used:   Electronically to        [facility]* (retail)       [address]       [place], [place]  [address]       Ph: [phone]       Fax: [phone]   RxID:   [id number]

## 2010-06-09 NOTE — Letter (Signed)
Summary: Generic Letter  [facility] [place], [place] [address]   Phone: [phone]  Fax: [phone]    01/22/2009  [name] [address] [place], [place]  [address]  To Whom It May Concern:  [name] is a patient followed  in our internal medicine practice.  This individual has multiple medical problems including diabetes, hypertension, coronary artery disease, and advanced arthritis.  She requires assistance in all aspects of daily living.  It is critical that she have family support, so that she may live independently.  If further details are required please do not hesitate to contact this office.          Sincerely,   Bluford Kaufmann  MD

## 2010-06-09 NOTE — Assessment & Plan Note (Signed)
Summary: bs elevated ok per doc/njr   Vital Signs:  Patient profile:   76 year old female Weight:      380 pounds Temp:     98.2 degrees F oral BP sitting:   132 / 84  (left arm) Cuff size:   large  Vitals Entered By: Cay Schillings LPN (June 27, 624THL QA348G PM) CC: c/o elevated BS , headache, stomach pain, (R) leg numbness         fbs 178  but has been running in the 500's Is Patient Diabetic? Yes Did you bring your meter with you today? No   CC:  c/o elevated BS , headache, stomach pain, and (R) leg numbness         fbs 178  but has been running in the 500's.  History of Present Illness: 49 -year-old patient who is seen today for follow-up of her type 2 diabetes.  She received a cortisone injection to her left shoulder 7 days ago, and blood sugars have a running quite high.  Her main complaint today is dizziness and fatigue.  She also complains of some numbness involving her lower extremities.  She has a history of treated hypertension, coronary artery disease, osteoarthritis.  She also has a history of polymyalgia rheumatica and is on prednisone 5 mg daily.  He  Allergies: 1)  ! Sulfa 2)  ! Codeine  Past History:  Past Medical History: Reviewed history from 02/15/2009 and no changes required. COPD Diabetes mellitus, type II Diverticulitis, hx of Hyperlipidemia Hypertension Coronary artery disease Urinary incontinence insomnia Osteoarthritis morbid obesity obstructive sleep apnea polymyalgia rheumatica Anxiety Disorder Arthritis Depression Morbid obesity  Family History: Reviewed history from 02/15/2009 and no changes required. the patient's mother died of breast cancer father history of arthritis, hypertension, and throat cancer Four sisters; history of obesity, arthritis Family History of Diabetes: [place] Family History of Heart Disease: Mother  Review of Systems       The patient complains of anorexia, weight gain, headaches, muscle weakness,  difficulty walking, and depression.  The patient denies fever, weight loss, vision loss, decreased hearing, hoarseness, chest pain, syncope, dyspnea on exertion, peripheral edema, prolonged cough, hemoptysis, abdominal pain, hematochezia, severe indigestion/heartburn, hematuria, incontinence, genital sores, suspicious skin lesions, transient blindness, unusual weight change, abnormal bleeding, enlarged lymph nodes, angioedema, and breast masses.    Physical Exam  General:  morbidly obese.  Blood pressure 122/80 in the Head:  Normocephalic and atraumatic without obvious abnormalities. No apparent alopecia or balding. Eyes:  No corneal or conjunctival inflammation noted. EOMI. Perrla. Funduscopic exam benign, without hemorrhages, exudates or papilledema. Vision grossly normal. Mouth:  Oral mucosa and oropharynx without lesions or exudates.  Teeth in good repair. Neck:  No deformities, masses, or tenderness noted. Lungs:  Normal respiratory effort, chest expands symmetrically. Lungs are clear to auscultation, no crackles or wheezes. Heart:  Normal rate and regular rhythm. S1 and S2 normal without gallop, murmur, click, rub or other extra sounds. Abdomen:  of these Msk:  No deformity or scoliosis noted of thoracic or lumbar spine.   Pulses:  pedal pulses intact Extremities:  No clubbing, cyanosis, edema, or deformity noted with normal full range of motion of all joints.   Skin:  Intact without suspicious lesions or rashes  Diabetes Management Exam:    Foot Exam (with socks and/or shoes not present):       Sensory-Pinprick/Light touch:          Left medial foot (L-4): diminished  Left dorsal foot (L-5): diminished          Left lateral foot (S-1): diminished          Right medial foot (L-4): diminished          Right dorsal foot (L-5): diminished          Right lateral foot (S-1): diminished       Sensory-Monofilament:          Left foot: diminished          Right foot: diminished        Inspection:          Left foot: normal       Nails:          Left foot: thickened          Right foot: thickened    Foot Exam by Podiatrist:       Date: 11/01/2009       Results: mild diabetic findings       Done by: PCP   Impression & Recommendations:  Problem # 1:  OBESITY, MORBID (ICD-278.01)  Orders: Venipuncture [id number]) TLB-BMP (Basic Metabolic Panel-BMET) (99991111) TLB-CBC Platelet - w/Differential (85025-CBCD) TLB-Hepatic/Liver Function Pnl (80076-HEPATIC) TLB-TSH (Thyroid Stimulating Hormone) (84443-TSH)  Problem # 2:  POLYMYALGIA RHEUMATICA (ICD-725)  Orders: TLB-Sedimentation Rate (ESR) (85652-ESR)  Problem # 3:  OSTEOARTHRITIS (ICD-715.90)  Orders: Venipuncture [id number]) TLB-BMP (Basic Metabolic Panel-BMET) (99991111) TLB-CBC Platelet - w/Differential (85025-CBCD) TLB-Hepatic/Liver Function Pnl (80076-HEPATIC)  Her updated medication list for this problem includes:    Meloxicam 7.5 Mg Tabs (Meloxicam) ..... One daily as needed  Problem # 4:  DIABETES MELLITUS, TYPE II (ICD-250.00)  Her updated medication list for this problem includes:    Metformin Hcl 1000 Mg Tabs (Metformin hcl) .[name]... Take 1 tablet by mouth two times a day    Amaryl 4 Mg Tabs (Glimepiride) .[name]... Take 1 tablet by mouth once a day    Novolog Flexpen 100 Unit/ml Soln (Insulin aspart) .[name]... 16  u with meals    Lantus Solostar 100 Unit/ml Soln (Insulin glargine) .Marland KitchenMarland KitchenMarland KitchenMarland Kitchen 64 units at bedtime, dispense 1 day supply    Lantus 100 Unit/ml Soln (Insulin glargine) .Marland KitchenMarland KitchenMarland KitchenMarland Kitchen 64 units at bedtime  Orders: Venipuncture [id number]) TLB-BMP (Basic Metabolic Panel-BMET) (99991111) TLB-CBC Platelet - w/Differential (85025-CBCD) TLB-Hepatic/Liver Function Pnl (80076-HEPATIC) TLB-Cholesterol, Total (82465-CHO)  Complete Medication List: 1)  Metformin Hcl 1000 Mg Tabs (Metformin hcl) .... Take 1 tablet by mouth two times a day 2)  Metoprolol Succinate 50 Mg Tb24 (Metoprolol succinate) .... 1/2 tab  two times a day 3)  Prozac 40 Mg Caps (Fluoxetine hcl) .... Take 1 tablet by mouth once a day 4)  Amaryl 4 Mg Tabs (Glimepiride) .... Take 1 tablet by mouth once a day 5)  Novolog Flexpen 100 Unit/ml Soln (Insulin aspart) .[name].. 16  u with meals 6)  Freestyle Lite Test Strp (Glucose blood) .... As directed 7)  Isosorbide Mononitrate Cr 60 Mg Tb24 (Isosorbide mononitrate) .... 1/2 qam 8)  Anti-fungal 1 % Powd (Tolnaftate) .... Use  twice daily 9)  Alprazolam 0.5 Mg Tbdp (Alprazolam) .[name].. 1 once daily prn 10)  Lantus Solostar 100 Unit/ml Soln (Insulin glargine) .... 64 units at bedtime, dispense 1 day supply 11)  Bd Disp Needles 30g X 1/2" Misc (Needle (disp)) .... As directed 12)  Fexofenadine Hcl 180 Mg Tabs (Fexofenadine hcl) .... One daily 13)  Proair Hfa 108 (90 Base) Mcg/act Aers (Albuterol sulfate) .... Two inhalations  every 6 hours as needed  for shortness of breath 14)  Reglan 10 Mg Tabs (Metoclopramide hcl) .[name].. 1  qid 15)  Sudafed 30 Mg Tabs (Pseudoephedrine hcl) .... As needed 16)  Prednisone 5 Mg Tabs (Prednisone) .... One daily 17)  Iron 325 (65 Fe) Mg Tabs (Ferrous sulfate) .[name].. 1 by mouth bid 18)  Lantus 100 Unit/ml Soln (Insulin glargine) .... 64 units at bedtime 19)  Budeprion Xl 150 Mg Xr24h-tab (Bupropion hcl) .... One daily for two weeks, then two daily 20)  Meloxicam 7.5 Mg Tabs (Meloxicam) .... One daily as needed  Other Orders: TLB-A1C / Hgb A1C (Glycohemoglobin) (83036-A1C)  Patient Instructions: 1)  Please schedule a follow-up appointment in 3 months. 2)  Limit your Sodium (Salt) to less than 2 grams a day(slightly less than 1/2 a teaspoon) to prevent fluid retention, swelling, or worsening of symptoms. 3)  You need to lose weight. Consider a lower calorie diet and regular exercise.  4)  Check your blood sugars regularly. If your readings are usually above : or below 70 you should contact our office. 5)  It is important that your Diabetic A1c level is checked every 3  months. 6)  See your eye doctor yearly to check for diabetic eye damage. Prescriptions: MELOXICAM 7.5 MG TABS (MELOXICAM) one daily as needed  #50 x 0   Entered and Authorized by:   Marletta Lor  MD   Signed by:   Marletta Lor  MD on 11/01/2009   Method used:   Print then Give to Patient   RxID:   [id number] BUDEPRION XL 150 MG XR24H-TAB (BUPROPION HCL) one daily for two weeks, then two daily  #90 x 6   Entered and Authorized by:   Marletta Lor  MD   Signed by:   Marletta Lor  MD on 11/01/2009   Method used:   Electronically to        [facility] (retail)       [place], [place]  [address]       Ph: [phone]       Fax: [phone]   [id number]:[id number] LANTUS 100 UNIT/ML SOLN (INSULIN GLARGINE) 64 units at bedtime  #3 x 6   Entered and Authorized by:   Marletta Lor  MD   Signed by:   Marletta Lor  MD on 11/01/2009   Method used:   Electronically to        [facility] (retail)       [place], [place]  [address]       Ph: [phone]       Fax: [phone]   [id number] PREDNISONE 5 MG TABS (PREDNISONE) one daily  #90 x 4   Entered and Authorized by:   Marletta Lor  MD   Signed by:   Marletta Lor  MD on 11/01/2009   Method used:   Electronically to        [facility] (retail)       [address]       [place], [place]  [address]       Ph: [phone]       Fax: [phone]   RxID:   [id number] [place] 108 (  90 BASE) MCG/ACT AERS (ALBUTEROL SULFATE) two inhalations  every 6 hours as needed for shortness of breath  #1 x 6   Entered and Authorized by:   Marletta Lor  MD   Signed by:   Marletta Lor  MD on 11/01/2009   Method used:   Electronically to        [facility] (retail)       [address]       [place], [place]  [address]       Ph: [phone]       Fax: [phone]   [id number] FEXOFENADINE HCL 180 MG TABS (FEXOFENADINE HCL) one daily  #30 x 2   Entered and Authorized by:   Marletta Lor  MD   Signed by:   Marletta Lor  MD on 11/01/2009   Method used:   Electronically to        [facility] (retail)       [address]       [place], [place]  [address]       Ph: [phone]       Fax: [phone]   [id number] BD DISP NEEDLES 30G X 1/2" MISC (NEEDLE (DISP)) as directed  #90 x 6   Entered and Authorized by:   Marletta Lor  MD   Signed by:   Marletta Lor  MD on 11/01/2009   Method used:   Electronically to        [facility] (retail)       [place], [place]  [address]       Ph: [phone]       Fax: [phone]   [id number] LANTUS SOLOSTAR 100 UNIT/ML SOLN (INSULIN GLARGINE) 64 units at bedtime, dispense 1 day supply  #1 x 6   Entered and Authorized by:   Marletta Lor  MD   Signed by:   Marletta Lor  MD on 11/01/2009   Method used:   Electronically to        [facility] (retail)       [place], [place]  [address]       Ph: [phone]       Fax: [phone]   RxID:   [id number] ISOSORBIDE MONONITRATE CR 60 MG  TB24 (ISOSORBIDE MONONITRATE) 1/2 qam  #90 Tablet x 4   Entered and Authorized by:   Marletta Lor  MD   Signed by:   Marletta Lor  MD on 11/01/2009   Method used:   Electronically to        [facility] (retail)       [address]       [place], [place]  [address]       Ph: [phone]       Fax: [phone]   [id number] FREESTYLE LITE TEST   STRP (GLUCOSE BLOOD) as directed  #100 x 6   Entered and Authorized by:   Marletta Lor   MD   Signed by:   Marletta Lor  MD  on 11/01/2009   Method used:   Electronically to        [facility] (retail)       [address]       [place], [place]  [address]       Ph: [phone]       Fax: [phone]   RxID:   [id number] NOVOLOG FLEXPEN 100 UNIT/ML  SOLN (INSULIN ASPART) 16  u with meals  #3 vials x 6   Entered and Authorized by:   Marletta Lor  MD   Signed by:   Marletta Lor  MD on 11/01/2009   Method used:   Electronically to        [facility] (retail)       [address]       [place], [place]  [address]       Ph: [phone]       Fax: [phone]   [id number] AMARYL 4 MG  TABS (GLIMEPIRIDE) Take 1 tablet by mouth once a day  #90 x 6   Entered and Authorized by:   Marletta Lor  MD   Signed by:   Marletta Lor  MD on 11/01/2009   Method used:   Electronically to        [facility] (retail)       [address]       [place], [place]  [address]       Ph: [phone]       Fax: [phone]   [id number] PROZAC 40 MG  CAPS (FLUOXETINE HCL) Take 1 tablet by mouth once a day  #90 x 6   Entered and Authorized by:   Marletta Lor  MD   Signed by:   Marletta Lor  MD on 11/01/2009   Method used:   Electronically to        [facility] (retail)       [place], [place]  [address]       Ph: [phone]       Fax: [phone]   [id number] METOPROLOL SUCCINATE 50 MG  TB24 (METOPROLOL SUCCINATE) 1/2 tab two times a day  #90 x 6   Entered and Authorized by:   Marletta Lor  MD   Signed by:   Marletta Lor  MD on 11/01/2009   Method used:   Electronically to        [facility] (retail)       [place],   [address]       Ph:  [phone]       Fax: [phone]   [id number] METFORMIN HCL 1000 MG  TABS (METFORMIN HCL) Take 1 tablet by mouth two times a day  #180 x 6   Entered and Authorized by:   Marletta Lor  MD   Signed by:   Marletta Lor  MD on 11/01/2009   Method used:   Electronically to        [facility] (retail)       [address]  [place], [place]  [address]       Ph: [phone]       Fax: [phone]   RxID:   [id number]

## 2010-06-09 NOTE — Progress Notes (Signed)
Summary: Call-A-Nurse Report    Call-A-Nurse Triage Call Report Triage Record Num: [id number] Operator: Jeanett Schlein Patient Name: [name] Call Date & Time: 10/23/2009 2:14:42PM Patient Phone: [phone] PCP: Marletta Lor Patient Gender: Female PCP Fax : [phone] Patient DOB: 03/27/1935 Practice Name: [facility] Reason for Call: Daughter caller, she has just gotten up and checked her blood sugar and same is 459mg . Has a h/a. She wasn't feeling too bad when she went to bed last night but didn't feel like getting up this morning to do anything. Describes as extreme fatigue. Needs to be seen in ED, going to [facility] now for evaluation. Protocol(s) Used: Diabetes: Out of Control Recommended Outcome per Protocol: See ED Immediately Reason for Outcome: Blood sugar more than 300 mg/dl AND signs/symptoms of ketoacidosis Care Advice:  ~ 10/23/2009 2:22:44PM Page 1 of 1 CAN_TriageRpt_V2

## 2010-06-09 NOTE — Procedures (Signed)
Summary: Colonoscopy  Patient: [name] Note: All result statuses are Final unless otherwise noted.  Tests: (1) Colonoscopy (COL)   COL Colonoscopy           [facility]     [place], [place]  [address]           COLONOSCOPY PROCEDURE REPORT           PATIENT:  [name], [name]  MR#:  [id number]     BIRTHDATE:  1935-02-22, 74 yrs. old  GENDER:  female           ENDOSCOPIST:  Gatha Mayer, MD, [facility]     Referred by:  Bluford Kaufmann, M.D.           PROCEDURE DATE:  03/22/2009     PROCEDURE:  Colonoscopy [id number]     ASA CLASS:  Class III     INDICATIONS:  rectal bleeding           MEDICATIONS:   Fentanyl 75 mcg IV, Versed 8 mg           DESCRIPTION OF PROCEDURE:   After the risks benefits and     alternatives of the procedure were thoroughly explained, informed     consent was obtained.  Digital rectal exam was performed and     revealed no abnormalities.   The [id number]) endoscope was     introduced through the anus and advanced to the cecum, which was     identified by both the appendix and ileocecal valve, without     limitations.  The quality of the prep was excellent, using     MiraLax.  The instrument was then slowly withdrawn as the colon     was fully examined. Insertion to cecum 2 minutes, withdrawal from     cecum to anus 10 minutes.     <<PROCEDUREIMAGES>>           FINDINGS:  A normal appearing cecum, ileocecal valve, and     appendiceal orifice were identified. The ascending, hepatic     flexure, transverse, splenic flexure, descending, sigmoid colon,     and rectum appeared unremarkable.   Retroflexed views in the     rectum revealed internal and external hemorrhoids.    The scope     was then withdrawn from the patient and the procedure completed.           COMPLICATIONS:  None           ENDOSCOPIC IMPRESSION:     1) Normal colon     2) Internal and external hemorrhoids in the rectum (source of     rectal  bleeding)     3) excellent prep     RECOMMENDATIONS:     Start Ferrous sulfate 325 mg 2 times a day for anemia. (Ferritin     was 36 - low normal, iron sat and TIBC also low so anemia likely     chronic disease +/- iron-deficiency). Will need to follow-up the     anemia and response to iron through Dr. Burnice Logan.     An EGD is appropriate given these findings today and the anemia     issues. My office will contact her to arrange this (at hospital).                 REPEAT EXAM:  In for not necessary.  Gatha Mayer, MD, [facility]           CC:  Marletta Lor, MD     The Patient           n.     eSIGNED:   Gatha Mayer at 03/22/2009 10:10 AM           [name], [id number]  Note: An exclamation mark (!) indicates a result that was not dispersed into the flowsheet. Document Creation Date: 03/22/2009 10:10 AM _______________________________________________________________________  (1) Order result status: Final Collection or observation date-time: 03/22/2009 09:57 Requested date-time:  Receipt date-time:  Reported date-time:  Referring Physician:   Ordering Physician: Silvano Rusk [phone]) Specimen Source:  Source: Tawanna Cooler Order Number: [id number] Lab site:

## 2010-06-15 ENCOUNTER — Inpatient Hospital Stay (HOSPITAL_COMMUNITY): Admission: RE | Admit: 2010-06-15 | Payer: Self-pay | Source: Ambulatory Visit

## 2010-06-15 NOTE — Assessment & Plan Note (Signed)
Summary: URI/SOB/dm   Vital Signs:  Patient profile:   76 year old female Temp:     98.1 degrees F oral BP sitting:   130 / 80  (left arm) Cuff size:   large  Vitals Entered By: Cay Schillings LPN (January 30, [id number] 10:52 AM) CC: c/o head congestion, headache , stomach pain and back pain  Is Patient Diabetic? Yes Did you bring your meter with you today? No CBG Result 87   CC:  c/o head congestion, headache , and stomach pain and back pain .  History of Present Illness: 76 year old patient who is seen today in follow-up.  She was evaluated in the emergency room 9 days ago.  More recently, she was placed on cephalexin for a UTI.  Complaints at that time occurred, abdominal pain, and headache.  A head CT scan was performed.  An abdominal and pelvic CT scan revealed the gallstones and she has been seen by general surgery.  She is scheduled for a hepatobiliary scan soon.  She continues to have the headache, but her bowel pain seems improved.  She feels unwell with some ongoing head and chest congestion.  She is having some persistent mild headaches. She has a history of suspected polymyalgia rheumatica and has been on prednisone 10 mg every morning. She has insulin requiring diabetes.  She has not intensified her insulin therapy since her last visit.  Hemoglobin A1c was elevated at that time, and she was instructed to increase her bedtime, insulin to 70 units and her mealtime insulin to 20 units.  She remains on her prior dose she has hypertension and coronary artery disease, which have been stable  Allergies: 1)  ! Sulfa 2)  ! Codeine  Past History:  Past Medical History: COPD Diabetes mellitus, type II Diverticulitis, hx of Hyperlipidemia Hypertension Coronary artery disease Urinary incontinence insomnia Osteoarthritis morbid obesity obstructive sleep apnea polymyalgia rheumatica Anxiety Disorder Arthritis Depression Morbid obesity Cholelithiasis  Past Surgical  History: Reviewed history from 06/04/2009 and no changes required. cath 10/03 CABG Appendectomy status post gastric bypass 1977, with reversal in 1979  Family History: Reviewed history from 02/15/2009 and no changes required. the patient's mother died of breast cancer father history of arthritis, hypertension, and throat cancer Four sisters; history of obesity, arthritis Family History of Diabetes: [place] Family History of Heart Disease: Mother  Social History: Reviewed history from 02/15/2009 and no changes required. Married Patient has never smoked.  Alcohol Use - no Illicit Drug Use - no  Review of Systems       The patient complains of anorexia, dyspnea on exertion, peripheral edema, abdominal pain, muscle weakness, and abnormal bleeding.  The patient denies fever, weight loss, weight gain, vision loss, decreased hearing, hoarseness, chest pain, syncope, prolonged cough, headaches, hemoptysis, melena, hematochezia, severe indigestion/heartburn, hematuria, incontinence, genital sores, suspicious skin lesions, transient blindness, difficulty walking, depression, unusual weight change, enlarged lymph nodes, angioedema, and breast masses.         patient complains of intermittent vaginal bleeding.  She has seen gynecology Harrington Challenger)  for evaluation last year  Physical Exam  General:  morbidly obese blood pressure normal Head:  Normocephalic and atraumatic without obvious abnormalities. No apparent alopecia or balding. Eyes:  No corneal or conjunctival inflammation noted. EOMI. Perrla. Funduscopic exam benign, without hemorrhages, exudates or papilledema. Vision grossly normal. Ears:  External ear exam shows no significant lesions or deformities.  Otoscopic examination reveals clear canals, tympanic membranes are intact bilaterally without bulging, retraction, inflammation or discharge.  Hearing is grossly normal bilaterally. Mouth:  Oral mucosa and oropharynx without lesions or  exudates.  Teeth in good repair. Neck:  No deformities, masses, or tenderness noted. Lungs:  Normal respiratory effort, chest expands symmetrically. Lungs are clear to auscultation, no crackles or wheezes.  O2 saturation 97% Heart:  Normal rate and regular rhythm. S1 and S2 normal without gallop, murmur, click, rub or other extra sounds.  pulse rate 64 Abdomen:  morbidly obese nontender Msk:  No deformity or scoliosis noted of thoracic or lumbar spine.   Neurologic:  wheelchair-bound   Impression & Recommendations:  Problem # 1:  CHOLELITHIASIS (ICD-574.20) hepatobiliary scan and  general surgical follow-up.  Doubtful.  Patient has symptomatic gallstones  Problem # 2:  UTI (ICD-599.0)  Her updated medication list for this problem includes:    Cephalexin 250 Mg Caps (Cephalexin) ..... Qid  Problem # 3:  POLYMYALGIA RHEUMATICA ([id number])  Problem # 4:  DIABETES MELLITUS, TYPE II (ICD-250.00)  Her updated medication list for this problem includes:    Metformin Hcl 1000 Mg Tabs (Metformin hcl) .[name]... Take 1 tablet by mouth two times a day    Amaryl 4 Mg Tabs (Glimepiride) .[name]... Take 1 tablet by mouth once a day    Novolog Flexpen 100 Unit/ml Soln (Insulin aspart) .[name]... 20  u with meals    Lantus 100 Unit/ml Soln (Insulin glargine) .Marland KitchenMarland KitchenMarland KitchenMarland Kitchen 70 units at bedtime  Orders: Capillary Blood Glucose/CBG [id number]) Venipuncture [id number])  Complete Medication List: 1)  Metformin Hcl 1000 Mg Tabs (Metformin hcl) .... Take 1 tablet by mouth two times a day 2)  Metoprolol Succinate 50 Mg Tb24 (Metoprolol succinate) .... 1/2 tab two times a day 3)  Prozac 40 Mg Caps (Fluoxetine hcl) .... Take 1 tablet by mouth once a day 4)  Amaryl 4 Mg Tabs (Glimepiride) .... Take 1 tablet by mouth once a day 5)  Novolog Flexpen 100 Unit/ml Soln (Insulin aspart) .... 20  u with meals 6)  Freestyle Lite Test Strp (Glucose blood) .... As directed 7)  Isosorbide Mononitrate Cr 60 Mg Tb24 (Isosorbide mononitrate) ....  1/2 qam 8)  Anti-fungal 1 % Powd (Tolnaftate) .... Use  twice daily 9)  Alprazolam 0.5 Mg Tbdp (Alprazolam) .[name].. 1 once daily prn 10)  Bd Disp Needles 30g X 1/2" Misc (Needle (disp)) .... As directed 11)  Fexofenadine Hcl 180 Mg Tabs (Fexofenadine hcl) .... One daily 12)  Reglan 10 Mg Tabs (Metoclopramide hcl) .[name].. 1  qid 13)  Sudafed 30 Mg Tabs (Pseudoephedrine hcl) .... As needed 14)  Prednisone 5 Mg Tabs (Prednisone) .... Two by mouth every morning 15)  Iron 325 (65 Fe) Mg Tabs (Ferrous sulfate) .[name].. 1 by mouth bid 16)  Lantus 100 Unit/ml Soln (Insulin glargine) .... 70 units at bedtime 17)  Budeprion Xl 150 Mg Xr24h-tab (Bupropion hcl) .... One daily for two weeks, then two daily 18)  Meloxicam 7.5 Mg Tabs (Meloxicam) .... One daily as needed 19)  Ventolin Hfa 108 (90 Base) Mcg/act Aers (Albuterol sulfate) .... 2 inhalations q6hr as needed 20)  Meclizine Hcl 25 Mg Tabs (Meclizine hcl) .[name].. 1 by mouth q6hrs as needed vertigo 21)  Cephalexin 250 Mg Caps (Cephalexin) .... Qid 22)  Benzonatate 200 Mg Caps (Benzonatate) .... One every 8 hours  Other Orders: Albuterol Sulfate Sol 1mg  unit dose [id number]) Nebulizer Tx [id number])  Patient Instructions: 1)  Please schedule a follow-up appointment in 1 month. 2)  Limit your Sodium (Salt). 3)  Check your blood sugars regularly.  If your readings are usually above : or below 70 you should contact our office. 4)  Take your antibiotic as prescribed until ALL of it is gone, but stop if you develop a rash or swelling and contact our office as soon as possible. Prescriptions: BENZONATATE 200 MG CAPS (BENZONATATE) one every 8 hours  #21 x 0   Entered and Authorized by:   Marletta Lor  MD   Signed by:   Marletta Lor  MD on 06/06/2010   Method used:   Electronically to        [facility] (retail)       [address]       [place], [place]  [address]       Ph: [phone]       Fax: [phone]   RxID:    [id number]    Medication Administration  Medication # 1:    Medication: Albuterol Sulfate Sol 1mg  unit dose    Diagnosis: COPD (ICD-496)    Dose: 2.5mg /26ml    Route: po    Exp Date: 10/2010    Lot #: [id number]    Mfr: nephron    Patient tolerated medication without complications    Given by: Cay Schillings LPN (January 30, [id number] 1:07 PM)  Orders Added: 1)  Capillary Blood Glucose/CBG [82948] 2)  Est. Patient Level IV [id number] 3)  Venipuncture [id number] 4)  Albuterol Sulfate Sol 1mg  unit dose [[id number]] 5)  Nebulizer Tx [id number]

## 2010-06-20 ENCOUNTER — Telehealth: Payer: Self-pay | Admitting: Internal Medicine

## 2010-06-21 ENCOUNTER — Inpatient Hospital Stay (HOSPITAL_COMMUNITY)
Admission: EM | Admit: 2010-06-21 | Discharge: 2010-06-23 | DRG: 247 | Disposition: A | Discharge status: Home / Self Care | Payer: PRIVATE HEALTH INSURANCE | Attending: Cardiology | Admitting: Cardiology

## 2010-06-21 ENCOUNTER — Emergency Department (HOSPITAL_COMMUNITY): Payer: PRIVATE HEALTH INSURANCE

## 2010-06-21 DIAGNOSIS — Z6841 Body Mass Index (BMI) 40.0 and over, adult: Secondary | ICD-10-CM

## 2010-06-21 DIAGNOSIS — M353 Polymyalgia rheumatica: Secondary | ICD-10-CM | POA: Diagnosis present

## 2010-06-21 DIAGNOSIS — D72829 Elevated white blood cell count, unspecified: Secondary | ICD-10-CM | POA: Diagnosis present

## 2010-06-21 DIAGNOSIS — T380X5A Adverse effect of glucocorticoids and synthetic analogues, initial encounter: Secondary | ICD-10-CM | POA: Diagnosis present

## 2010-06-21 DIAGNOSIS — G4700 Insomnia, unspecified: Secondary | ICD-10-CM | POA: Diagnosis present

## 2010-06-21 DIAGNOSIS — Z79899 Other long term (current) drug therapy: Secondary | ICD-10-CM

## 2010-06-21 DIAGNOSIS — I10 Essential (primary) hypertension: Secondary | ICD-10-CM | POA: Diagnosis present

## 2010-06-21 DIAGNOSIS — Z7902 Long term (current) use of antithrombotics/antiplatelets: Secondary | ICD-10-CM

## 2010-06-21 DIAGNOSIS — D509 Iron deficiency anemia, unspecified: Secondary | ICD-10-CM | POA: Diagnosis present

## 2010-06-21 DIAGNOSIS — I200 Unstable angina: Secondary | ICD-10-CM | POA: Diagnosis present

## 2010-06-21 DIAGNOSIS — E119 Type 2 diabetes mellitus without complications: Secondary | ICD-10-CM | POA: Diagnosis present

## 2010-06-21 DIAGNOSIS — R079 Chest pain, unspecified: Secondary | ICD-10-CM

## 2010-06-21 DIAGNOSIS — J449 Chronic obstructive pulmonary disease, unspecified: Secondary | ICD-10-CM | POA: Diagnosis present

## 2010-06-21 DIAGNOSIS — J4489 Other specified chronic obstructive pulmonary disease: Secondary | ICD-10-CM | POA: Diagnosis present

## 2010-06-21 DIAGNOSIS — Z91199 Patient's noncompliance with other medical treatment and regimen due to unspecified reason: Secondary | ICD-10-CM

## 2010-06-21 DIAGNOSIS — Z7982 Long term (current) use of aspirin: Secondary | ICD-10-CM

## 2010-06-21 DIAGNOSIS — I252 Old myocardial infarction: Secondary | ICD-10-CM

## 2010-06-21 DIAGNOSIS — R5381 Other malaise: Secondary | ICD-10-CM | POA: Diagnosis present

## 2010-06-21 DIAGNOSIS — F429 Obsessive-compulsive disorder, unspecified: Secondary | ICD-10-CM | POA: Diagnosis present

## 2010-06-21 DIAGNOSIS — R32 Unspecified urinary incontinence: Secondary | ICD-10-CM | POA: Diagnosis present

## 2010-06-21 DIAGNOSIS — M1990 Unspecified osteoarthritis, unspecified site: Secondary | ICD-10-CM | POA: Diagnosis present

## 2010-06-21 DIAGNOSIS — Z9884 Bariatric surgery status: Secondary | ICD-10-CM

## 2010-06-21 DIAGNOSIS — Z9861 Coronary angioplasty status: Secondary | ICD-10-CM

## 2010-06-21 DIAGNOSIS — IMO0002 Reserved for concepts with insufficient information to code with codable children: Secondary | ICD-10-CM

## 2010-06-21 DIAGNOSIS — I2510 Atherosclerotic heart disease of native coronary artery without angina pectoris: Principal | ICD-10-CM | POA: Diagnosis present

## 2010-06-21 DIAGNOSIS — Z9119 Patient's noncompliance with other medical treatment and regimen: Secondary | ICD-10-CM

## 2010-06-21 DIAGNOSIS — E785 Hyperlipidemia, unspecified: Secondary | ICD-10-CM | POA: Diagnosis present

## 2010-06-21 DIAGNOSIS — F341 Dysthymic disorder: Secondary | ICD-10-CM | POA: Diagnosis present

## 2010-06-21 DIAGNOSIS — Z794 Long term (current) use of insulin: Secondary | ICD-10-CM

## 2010-06-21 DIAGNOSIS — E875 Hyperkalemia: Secondary | ICD-10-CM | POA: Diagnosis present

## 2010-06-21 DIAGNOSIS — G4733 Obstructive sleep apnea (adult) (pediatric): Secondary | ICD-10-CM | POA: Diagnosis present

## 2010-06-21 DIAGNOSIS — D638 Anemia in other chronic diseases classified elsewhere: Secondary | ICD-10-CM | POA: Diagnosis present

## 2010-06-21 LAB — CK TOTAL AND CKMB (NOT AT ARMC)
CK, MB: 1.5 ng/mL (ref 0.3–4.0)
Relative Index: INVALID (ref 0.0–2.5)
Total CK: 76 U/L (ref 7–177)

## 2010-06-21 LAB — POCT CARDIAC MARKERS
CKMB, poc: 1.4 ng/mL (ref 1.0–8.0)
Myoglobin, poc: 110 ng/mL (ref 12–200)
Troponin i, poc: <0.05 ng/mL (ref 0.00–0.09)

## 2010-06-21 LAB — DIFFERENTIAL
Basophils Absolute: 0 10*3/uL (ref 0.0–0.1)
Basophils Relative: 0 % (ref 0–1)
Eosinophils Absolute: 0.1 10*3/uL (ref 0.0–0.7)
Eosinophils Relative: 1 % (ref 0–5)
Lymphocytes Relative: 16 % (ref 12–46)
Lymphs Abs: 2.1 10*3/uL (ref 0.7–4.0)
Monocytes Absolute: 0.6 10*3/uL (ref 0.1–1.0)
Monocytes Relative: 5 % (ref 3–12)
Neutro Abs: 10.6 10*3/uL — ABNORMAL HIGH (ref 1.7–7.7)
Neutrophils Relative %: 79 % — ABNORMAL HIGH (ref 43–77)

## 2010-06-21 LAB — PROTIME-INR
INR: 0.92 (ref 0.00–1.49)
Prothrombin Time: 12.6 seconds (ref 11.6–15.2)

## 2010-06-21 LAB — CBC
HCT: 31.9 % — ABNORMAL LOW (ref 36.0–46.0)
Hemoglobin: 9.8 g/dL — ABNORMAL LOW (ref 12.0–15.0)
MCH: 26.6 pg (ref 26.0–34.0)
MCHC: 30.7 g/dL (ref 30.0–36.0)
MCV: 86.7 fL (ref 78.0–100.0)
Platelets: 330 10*3/uL (ref 150–400)
RBC: 3.68 MIL/uL — ABNORMAL LOW (ref 3.87–5.11)
RDW: 14.3 % (ref 11.5–15.5)
WBC: 13.4 10*3/uL — ABNORMAL HIGH (ref 4.0–10.5)

## 2010-06-21 LAB — APTT: aPTT: 30 seconds (ref 24–37)

## 2010-06-21 LAB — COMPREHENSIVE METABOLIC PANEL
ALT: 17 U/L (ref 0–35)
AST: 16 U/L (ref 0–37)
Albumin: 3.1 g/dL — ABNORMAL LOW (ref 3.5–5.2)
Alkaline Phosphatase: 60 U/L (ref 39–117)
BUN: 13 mg/dL (ref 6–23)
CO2: 28 mEq/L (ref 19–32)
Calcium: 9 mg/dL (ref 8.4–10.5)
Chloride: 99 mEq/L (ref 96–112)
Creatinine, Ser: 1.04 mg/dL (ref 0.4–1.2)
GFR calc Af Amer: >60 mL/min (ref >60)
GFR calc non Af Amer: 52 mL/min — ABNORMAL LOW (ref >60)
Glucose, Bld: 202 mg/dL — ABNORMAL HIGH (ref 70–99)
Potassium: 6.7 mEq/L (ref 3.5–5.1)
Sodium: 136 mEq/L (ref 135–145)
Total Bilirubin: 0.4 mg/dL (ref 0.3–1.2)
Total Protein: 7.5 g/dL (ref 6.0–8.3)

## 2010-06-21 LAB — TROPONIN I: Troponin I: 0.02 ng/mL (ref 0.00–0.06)

## 2010-06-21 LAB — POTASSIUM: Potassium: 5.5 mEq/L — ABNORMAL HIGH (ref 3.5–5.1)

## 2010-06-22 DIAGNOSIS — I2510 Atherosclerotic heart disease of native coronary artery without angina pectoris: Secondary | ICD-10-CM

## 2010-06-22 LAB — BASIC METABOLIC PANEL
BUN: 13 mg/dL (ref 6–23)
CO2: 30 mEq/L (ref 19–32)
Calcium: 9.1 mg/dL (ref 8.4–10.5)
Chloride: 102 mEq/L (ref 96–112)
Creatinine, Ser: 1.11 mg/dL (ref 0.4–1.2)
GFR calc Af Amer: 58 mL/min — ABNORMAL LOW (ref >60)
GFR calc non Af Amer: 48 mL/min — ABNORMAL LOW (ref >60)
Glucose, Bld: 89 mg/dL (ref 70–99)
Potassium: 5 mEq/L (ref 3.5–5.1)
Sodium: 140 mEq/L (ref 135–145)

## 2010-06-22 LAB — GLUCOSE, CAPILLARY
Glucose-Capillary: 97 mg/dL (ref 70–99)
Glucose-Capillary: 72 mg/dL (ref 70–99)
Glucose-Capillary: 101 mg/dL — ABNORMAL HIGH (ref 70–99)
Glucose-Capillary: 175 mg/dL — ABNORMAL HIGH (ref 70–99)
Glucose-Capillary: 195 mg/dL — ABNORMAL HIGH (ref 70–99)
Glucose-Capillary: 333 mg/dL — ABNORMAL HIGH (ref 70–99)
Glucose-Capillary: 394 mg/dL — ABNORMAL HIGH (ref 70–99)

## 2010-06-22 LAB — CBC
HCT: 30.1 % — ABNORMAL LOW (ref 36.0–46.0)
Hemoglobin: 9.4 g/dL — ABNORMAL LOW (ref 12.0–15.0)
MCH: 27.4 pg (ref 26.0–34.0)
MCHC: 31.2 g/dL (ref 30.0–36.0)
MCV: 87.8 fL (ref 78.0–100.0)
Platelets: 321 10*3/uL (ref 150–400)
RBC: 3.43 MIL/uL — ABNORMAL LOW (ref 3.87–5.11)
RDW: 14.6 % (ref 11.5–15.5)
WBC: 15.9 10*3/uL — ABNORMAL HIGH (ref 4.0–10.5)

## 2010-06-22 LAB — CARDIAC PANEL(CRET KIN+CKTOT+MB+TROPI)
CK, MB: 1.1 ng/mL (ref 0.3–4.0)
CK, MB: 1 ng/mL (ref 0.3–4.0)
Relative Index: INVALID (ref 0.0–2.5)
Relative Index: 0.9 (ref 0.0–2.5)
Total CK: 80 U/L (ref 7–177)
Total CK: 115 U/L (ref 7–177)
Troponin I: <0.01 ng/mL (ref 0.00–0.06)
Troponin I: 0.01 ng/mL (ref 0.00–0.06)

## 2010-06-22 LAB — LIPID PANEL
Cholesterol: 120 mg/dL (ref 0–200)
HDL: 40 mg/dL (ref >39)
LDL Cholesterol: 52 mg/dL (ref 0–99)
Total CHOL/HDL Ratio: 3 RATIO
Triglycerides: 142 mg/dL (ref <150)
VLDL: 28 mg/dL (ref 0–40)

## 2010-06-22 LAB — IRON AND TIBC
Iron: 25 ug/dL — ABNORMAL LOW (ref 42–135)
Saturation Ratios: 10 % — ABNORMAL LOW (ref 20–55)
TIBC: 241 ug/dL — ABNORMAL LOW (ref 250–470)
UIBC: 216 ug/dL

## 2010-06-22 LAB — FERRITIN: Ferritin: 62 ng/mL (ref 10–291)

## 2010-06-22 LAB — HEMOGLOBIN A1C
Hgb A1c MFr Bld: 9.1 % — ABNORMAL HIGH (ref <5.7)
Mean Plasma Glucose: 214 mg/dL — ABNORMAL HIGH (ref <117)

## 2010-06-22 LAB — POCT ACTIVATED CLOTTING TIME: Activated Clotting Time: 0 seconds

## 2010-06-22 LAB — TSH: TSH: 0.621 u[IU]/mL (ref 0.350–4.500)

## 2010-06-22 LAB — FOLATE: Folate: 8.6 ng/mL

## 2010-06-22 LAB — VITAMIN B12: Vitamin B-12: 331 pg/mL (ref 211–911)

## 2010-06-22 NOTE — H&P (Signed)
NAMEMarland [name]  GINDY, BILES NO.:  [id number]  MEDICAL RECORD NO.:  [id number]           PATIENT TYPE:  E  LOCATION:  [facility]                         FACILITY:  [facility]  PHYSICIAN:  Satira Sark, MD DATE OF BIRTH:  May 28, 1934  DATE OF ADMISSION:  06/21/2010 DATE OF DISCHARGE:                             HISTORY & PHYSICAL   PRIMARY CARDIOLOGIST:  Marcello Moores C. [name], MD, [facility]  PRIMARY CARE PHYSICIAN:  Marletta Lor, MD  REASON FOR ADMISSION:  Recurrent chest pain.  HISTORY OF PRESENT ILLNESS:  [name] is a morbidly obese 76 year old woman with past medical history including type 2 diabetes mellitus, hypertension, obstructive sleep apnea, hyperlipidemia, and coronary artery disease status post inferior wall myocardial infarction in 2004 status post placement of a bare-metal stent in the right coronary artery at a facility in [place] via the brachial approach.  Most recent cardiac catheterization in 2006 demonstrated nonobstructive disease that was managed medically.  Her last office visit with Dr. [name] was in January 2010.  More recently the patient has been following with Dr. Bradd Burner with [facility], and has undergone evaluation for possible gallbladder disease.  She underwent a CT scan of the abdomen and pelvis back in late January demonstrating evidence of cholelithiasis with postsurgical changes following remote history of a gastric bypassing, and perhaps some evidence of low grade partial bowel obstruction.  Incidentally noted was heavy atherosclerotic calcification of the coronary arteries.  She was referred for a followup HIDA scan which demonstrated no evidence of acute cholecystitis and normal gallbladder ejection fraction.  It seems that she has been having recurrent episodes of chest pain, largely atypical on the right side, typically after meals.  She states that she drinks "soda" and also uses baking soda when these episodes occur with  relief.  Earlier today she was at rest, recently ate some "corn" and began to develop abdominal tightness followed by a back pain and subsequently chest tightness with some radiation to the left shoulder.  She again took baking soda without relief and then decided to take a nitroglycerin which resolved symptoms after approximately 15 minutes total.  She became worried and contacted her family.  My understanding is that she was referred to the emergency department by Dr. Bradd Burner for further assessment.  She has had no further episodes of chest pain under observation.  ECG is reviewed showing sinus rhythm with nonspecific ST changes and technically nondiagnostic inferior Q-waves.  Recent tracings are also available from January and early February showing similar findings.  Initial cardiac markers are normal at this point.  Last ischemic evaluation was via Myoview in January 2007 which reported inferior and inferoseptal scar with associated hypokinesis, LVEF 49%, but no evidence of ischemia.  ALLERGIES:  SULFA DRUGS.  MEDICATIONS AT HOME: 1. Lantus 68 units subcu nightly. 2. NovoLog 40 units q.a.c. t.i.d. 3. Meclizine 25 mg p.r.n. 4. Toprol-XL 50 mg one-half tablet p.o. b.i.d. 5. Fluoxetine 40 mg p.o. daily. 6. Alprazolam 0.5 mg p.o. b.i.d. p.r.n. 7. Metformin 1000 mg p.o. b.i.d. 8. Glimepiride 4 mg p.o. daily. 9. Imdur 30 mg p.o. q.a.m.  10.Prednisone 5 mg 2 tablets p.o. daily. 11.Nitroglycerin 0.4 mg p.r.n. 12.Sudafed 30 mg p.o. q.i.d. p.r.n. 13.Ventolin HFA 1 or 2 puffs p.r.n. 14.Bupropion 150 mg p.o. daily.  There is discussion of medication     noncompliance noted in her record.  PAST MEDICAL HISTORY:  Outlined above.  Additional problems include depression with obsessive-compulsive disorder and anxiety, osteoarthritis, COPD, insomnia, history of diverticulitis, polymyalgia rheumatica, gastric bypass surgery in 1977 with reversal in 1979, cataract surgery, cesarean section,  appendectomy, urinary incontinence.  SOCIAL HISTORY:  The patient is separated from her husband.  Denies any tobacco or alcohol use.  Previously worked as a Solicitor for 20 years.  She lives in a home with her 2 daughters.  FAMILY HISTORY:  Reviewed.  [name] father died at age 10 with history of hypertension, diabetes mellitus, arthritis and throat cancer. Mother died at age 48 with breast cancer, had a history of heart disease as well.  No other cardiovascular disease noted in her siblings.  REVIEW OF SYSTEMS:  Detailed above.  The patient is functionally quite limited.  She states that she only moves around to get to the bathroom and other rooms in her house.  She is seated most of the day.  She reports NYHA class III dyspnea on exertion with these types of activities.  No obvious chest pain with these activities.  No palpitations.  Reports poor sleep.  Complains of myalgias, also arthralgias.  Seems to have some agoraphobia symptoms.  PHYSICAL EXAMINATION:  VITAL SIGNS:  Temperature is 98.2 degrees, heart rate 71 in sinus rhythm, respirations 18, blood pressure is 122/68, oxygen saturation is 100% on room air.  Weight approximately 385 pounds. GENERAL:  This is a morbidly obese woman presently without any chest discomfort or shortness of breath at rest. HEENT:  Conjunctivae and lids are normal.  Oropharynx is clear with upper dental plate. NECK:  Supple.  Increased girth.  No obvious elevated JVP or audible bruits.  No thyromegaly. LUNGS:  Exhibit diminished breath sounds but nonlabored and without wheezing. CARDIAC:  Distant regular heart sounds.  No obvious rub or gallop, although exam is limited. ABDOMEN:  Morbidly obese, unable to adequately palpate organs.  Bowel sounds are present.  There is no obvious tenderness. EXTREMITIES:  Exhibit no significant pitting edema.  Distal pulses are 1+. SKIN:  Warm and dry. MUSCULOSKELETAL:  No kyphosis is  noted. NEUROPSYCHIATRIC:  The patient is alert and oriented x3.  Affect seems grossly appropriate, somewhat flat.  LABORATORY DATA:  WBC is 13.4, hemoglobin 9.8, hematocrit 31.9, platelets 330, INR 0.9.  Sodium 136, potassium 5.5, chloride 99, bicarb 28, glucose 202, BUN 13, creatinine 1.0, AST is 16, ALT 17, albumin is 3.1.  Point of care CK-MB 1.4 and troponin I less than 0.05.  Chest x-ray from February 14 shows mild central venous pulmonary congestion with bronchitic changes, normal mediastinum and cardiac silhouette.  IMPRESSION: 1. Chest pain syndrome, different features over the last several     weeks, also associated with dyspnea on exertion in the setting of     very limited functional capacity.  Most recent episode of chest     pain today lasted for 15 minutes and resolved with nitroglycerin.     Electrocardiogram is nonspecific and cardiac markers are normal at     this point.  She has had no recurrent symptoms.  She and her     daughters present indicate a general acceleration in symptoms     over  the last few months.  Last ischemic workup was in 2007. 2. Known coronary artery disease status post inferior wall myocardial     infarction in 2004 managed with bare-metal stent placement to the     right coronary artery via the brachial approach at a facility in     [place].  Last angiogram in 2006 revealed nonobstructive disease. 3. Morbid obesity. 4. Hyperlipidemia, presently not on statin therapy. 5. Hypertension, blood pressure controlled at this point. 6. History of remote gastric bypass with subsequent reversal in the     late 1970s, recent CT scan of the abdomen and pelvis showing     postsurgical changes with question of some degree of low grade     partial obstruction unable to be excluded.  She is not reporting     any frank abdominal pain at this time, although has had     intermittent symptoms after meals. 7. Recent documentation of cholelithiasis, however  subsequent HIDA     scan demonstrates no acute cholecystitis with normal gallbladder     function.  Liver function tests are normal. 8. Hyperkalemia, etiology not clear.  This was a repeat level,     actually down from 6.7.  Renal function is normal.  Prior records     indicate borderline high potassium levels in the past as well. 9. Type 2 diabetes mellitus, reportedly poorly controlled over time. 10.Anemia, hemoglobin stable at least since late January.  MCV normal.     Records from December indicate the patient had an iron level of 35     with saturation of 11% and hemoglobin of 10.9 at that point.  There     is some discussion about possible colonoscopy, although I am not     certain if this was performed. 11.Polymyalgia rheumatica, on prednisone 12.History of depression and obsessive-compulsive disorder with     anxiety. 13.History of noncompliance medications.  PLAN:  The patient is being admitted to the telemetry unit to cycle a full set of cardiac markers and followup electrocardiogram.  She will need repeat labs as well for followup of hemoglobin and potassium with stool guaiac checks.  For the time being, we will continue her present home medications although hold metformin, initiate DVT prophylaxis, and add aspirin.  She is fairly complex and it is difficult to get a sense of her full-symptom complex.  It is not clear that all of these symptoms are cardiac, however, her most recent episode did improve with nitroglycerin.  She clearly has coronary artery disease with ongoing risk factors that would place her at increased risk for recurrent adverse cardiac events.  Her ECG is not acute at this point with initial normal cardiac markers.  It is certainly possible that she may be experiencing accelerating angina and the main question would be best mode of further ischemic evaluation.  It woud seem that noninvasive studies would be less helpful and potentially fraught with  artifact in light of her body habitus and limited functional capacity.  A cardiac catheterization could alternatively be considered, however at increased risk  of complications.  Perhaps a radial approach would be a consideration.   Presuming she has no evidence of an active bleed and electrolyte abnormalities are better understood, further cardiac testing can be considered.  Will  defer work-up to Dr. [name] going forward.     Satira Sark, MD     SGM/MEDQ  D:  06/21/2010  T:  06/21/2010  Job:  [id number]  cc:   Janifer Adie, M.D. Thomas C. [name], MD, [facility] Marletta Lor, MD  Electronically Signed by Rozann Lesches MD on 06/22/2010 [id number] AM

## 2010-06-23 DIAGNOSIS — I200 Unstable angina: Secondary | ICD-10-CM

## 2010-06-23 LAB — CBC
HCT: 32.2 % — ABNORMAL LOW (ref 36.0–46.0)
Hemoglobin: 9.8 g/dL — ABNORMAL LOW (ref 12.0–15.0)
MCH: 26.4 pg (ref 26.0–34.0)
MCHC: 30.4 g/dL (ref 30.0–36.0)
MCV: 86.8 fL (ref 78.0–100.0)
Platelets: 342 10*3/uL (ref 150–400)
RBC: 3.71 MIL/uL — ABNORMAL LOW (ref 3.87–5.11)
RDW: 14.5 % (ref 11.5–15.5)
WBC: 14.7 10*3/uL — ABNORMAL HIGH (ref 4.0–10.5)

## 2010-06-23 LAB — BASIC METABOLIC PANEL
BUN: 11 mg/dL (ref 6–23)
CO2: 28 mEq/L (ref 19–32)
Calcium: 8.8 mg/dL (ref 8.4–10.5)
Chloride: 101 mEq/L (ref 96–112)
Creatinine, Ser: 1 mg/dL (ref 0.4–1.2)
GFR calc Af Amer: >60 mL/min (ref >60)
GFR calc non Af Amer: 54 mL/min — ABNORMAL LOW (ref >60)
Glucose, Bld: 102 mg/dL — ABNORMAL HIGH (ref 70–99)
Potassium: 4.3 mEq/L (ref 3.5–5.1)
Sodium: 137 mEq/L (ref 135–145)

## 2010-06-23 LAB — GLUCOSE, CAPILLARY: Glucose-Capillary: 131 mg/dL — ABNORMAL HIGH (ref 70–99)

## 2010-06-24 ENCOUNTER — Ambulatory Visit: Payer: Self-pay | Admitting: Cardiology

## 2010-06-24 ENCOUNTER — Other Ambulatory Visit (HOSPITAL_COMMUNITY): Payer: Self-pay

## 2010-06-29 NOTE — Progress Notes (Signed)
   Phone Note From Other Clinic   Caller: Dr Bradd Burner Request: Talk with Provider Summary of Call: I took a DOD call from Dr Bradd Burner 06/16/10.  He states that the patient had been having atypical chest pain and abdominal pain for which he had ordered an ekg.  The ekg revealed new inferior q waves when compared to baseline.  There were no ischemic changes.  Pt reported that symtpoms had resolved and pt was stable. The patient has not been seen recently by Dr [name].  I therefore recommended that the patient be scheduled for follow-up with Dr [name]  at the next available time with an echo to evaluate for wall motion abnormalities.  IF the patient develops chest pain or ischemic symptoms, she should go to the ER in the interim. Initial call taken by: Thompson Grayer, MD,  June 20, 2010 1:49 PM

## 2010-06-29 NOTE — Discharge Summary (Addendum)
[name], [name]               ACCOUNT NO.:  [id number]  MEDICAL RECORD NO.:  [id number]           PATIENT TYPE:  I  LOCATION:  [id number]                         FACILITY:  [facility]  PHYSICIAN:  Satira Sark, MD DATE OF BIRTH:  January 14, 1935  DATE OF ADMISSION:  06/21/2010 DATE OF DISCHARGE:  06/23/2010                              DISCHARGE SUMMARY   DISCHARGE DIAGNOSES: 1. Unstable angina status post long drug-eluting stent placement to     the mid right coronary artery and drug-eluting stent placement,     proximal right coronary artery June 22, 2010, with residual     nonobstructive disease, otherwise.  Negative for myocardial     infarction this admission. 2. Previous history of coronary artery disease with bare-metal stent     to the right coronary artery in 2004. 3. Normal left ventricular function by cath, June 22, 2010. 4. Type 2 diabetes, insulin-dependent. 5. Morbid obesity with BMI of 60.6. 6. Hyperkalemia, resolved without intervention. 7. Anemia with decreased iron, decreased total iron binding capacity,     and decreased percent saturation, felt to be possible component of     iron deficiency anemia and anemia of chronic disease.  Followup     with primary care provider, no evidence of bleeding this admission. 8. Leukocytosis, possibly related to chronic prednisone use,     instructed to follow up with primary care provider.  No evidence of     infection. 9. Polymyalgia rheumatica. 10.History of hyperlipidemia with total cholesterol 120, triglycerides     142, HDL 40, LDL 52 this admission, initiated on pravastatin to     assist with prevention and progression of plaque. 11.Depression with obsessive-compulsive disorder and anxiety. 12.Osteoarthritis. 13.Chronic obstructive pulmonary disease. 14.Insomnia. 15.History of diverticulitis and recent evaluation for cholelithiasis     with followup HIDA scan demonstrating no acute cholecystitis. 16.Gastric  bypass surgery in 1977 with reversal in 1979. 17.Status post cataract surgery. 18.Status post cesarean section. 19.Status post appendectomy. 20.Urinary incontinence.  HOSPITAL COURSE:  [name] is a 76 year old of female with multiple medical problems as outlined above who presented to [facility] with recurrent episodes of chest pain, largely atypical on the right side typically after meals.  She stated when she drank soda and used baking soda, the episode subsided.  Earlier on day of admission, she developed abdominal tightness following the back pain and subsequent chest tightness with radiation to the left shoulder.  Baking soda did not give relief this time and nitroglycerin did give relief after about 15 minutes total.  She became worried and contacted her family.  She was referred to the emergency department by Dr. Bradd Burner for further assessment.  EKG showed sinus rhythm with nonspecific ST changes and technically nondiagnostic inferior Q-waves.  Cardiac markers were cycled which were negative.  Given her nonobstructive disease in 2006, she was admitted to the hospital for further evaluation.  Cardiac enzymes continued to be cycled which remained negative.  She was seen by Dr. [name], who knows her well, and was felt to need cardiac catheterization for further evaluation.  She ultimately found to have severe  RCA stenosis which was subsequently intervened upon with successful PCI using long drug-eluting stent in the mid vessel and 16-mm drug-eluting stent in the proximal vessel.  She had nonobstructive left main LAD and left circumflex stenoses for medical therapy.  Normal left ventricular function and EF was estimated at 55%.  The patient did well post procedurally and had resolution of the symptoms.  She ambulated with cardiac rehab with some assistance approximately 60 feet.  She is deconditioned at baseline, but states she is able to get around at home with her ADLs.   Dr. [name] has seen and examined her today and feels she is stable for discharge.  She had a few outstanding issues in regards to blood work including anemia and leukocytosis, please see above for remarks.  Now, ultimately these are felt to be chronic issues with no acute evidence for infection or bleeding and therefore will be referred back to primary care for further evaluation.  DISCHARGE LABORATORY DATA:  WBC 14.7, hemoglobin 9.8, hematocrit 32.2, platelet count 342,000.  Coagulation panel was normal, total iron 25, TIBC 241, percent saturation 10%, UIBC 216, vitamin B12 of 331, folate 8.6, ferritin 62, TSH 0.261.  total cholesterol panel as above.  Cardiac enzymes negative x4.  Hemoglobin A1c 9.1.  Sodium 137, potassium 4.3, chloride 101, CO2 of 28, glucose 102, BUN 11, creatinine 1.0.  LFTs were within normal limits, June 21, 2010, with the exception of decreased albumin at 3.1.  STUDIES: 1. Chest x-ray, June 21, 2010, showed mild central venous     pulmonary condition and bronchitic changes. 2. Cardiac catheterization June 22, 2010, please see full report     for details as well as HPI for summary.  DISCHARGE MEDICATIONS: 1. Aspirin 81 mg daily. 2. Nitroglycerin sublingual 0.4 mg every 5 minutes as needed up to     three doses for chest pain. 3. Pravachol 20 mg bedtime. 4. Effient 10 mg daily. 5. Aleve 220 mg daily as needed with instructions to only take if     necessary as it can cause increased risk of stomach bleeding while     taking medicines like aspirin and Effient. 6. Meloxicam 7.5 mg daily as needed with some more aforementioned     warning.  The patient was instructed to stop if she notices bloody     or dark stools. 7. Alprazolam 0.5 mg daily. 8. Amaryl 4 mg daily. 9. Benadryl 2 tablets daily as needed for allergies. 10.Benzonatate 200 mg every 8 hours. 11.Bupropion XL 150 mg daily. 12.Fexofenadine 180 mg daily bedtime. 13.Imdur 60 mg half tablet  every morning. 14.Lantus 70 units subcutaneously bedtime. 15.Meclizine 25 mg q.6 hours p.r.n. dizziness. 16.Metformin 1000 mg b.i.d. with instructions not to start until     June 25, 2010. 17.Metoprolol succinate 50 mg half-tablet b.i.d. 18.NovoLog 14 units subcutaneously t.i.d. 19.Prednisone 5 mg two tablets every morning. 20.Prozac 40 mg daily. 21.Reglan 10 mg q.i.d. p.r.n. 22.Tylenol Sinus over-the-counter 2 tablets daily bedtime p.r.n. 23.Ventolin inhaler 90 mcg 2 puffs inhaled every 6 hours as needed.  DISPOSITION:  [name] will be discharged in stable condition to home. She is not to lift anything for 1 week or participate in sexual activity for 1 week or drive for 2 days.  She is to follow low-sodium, heart- healthy diabetic diet, and if she notices any pain, swelling, bleeding, or pus at her cath site, she is to call or return.  I have discussed her lab abnormalities including her leukocytosis and anemia  with her in regards to follow up with her primary care provider and she states that she has an appointment with him next Wednesday and is instructed to keep this.  She will follow up with Dr. [name] on July 06, 2010, in [place] at 10:15 a.m.  DURATION OF DISCHARGE ENCOUNTER:  Greater than 30 minutes including physician and PA time.     Melina Copa, P.A.C.   ______________________________ Satira Sark, MD    DD/MEDQ  D:  06/23/2010  T:  06/24/2010  Job:  [id number]  cc:   Janifer Adie, M.D. Marletta Lor, MD [name]. [name], MD, [facility]  Electronically Signed by Rozann Lesches MD on 07/06/2010 11:27:43 AM Electronically Signed by Melina Copa  on 07/06/2010 12:57:15 PM

## 2010-06-30 ENCOUNTER — Other Ambulatory Visit (HOSPITAL_COMMUNITY): Payer: Self-pay | Admitting: Family Medicine

## 2010-06-30 DIAGNOSIS — N950 Postmenopausal bleeding: Secondary | ICD-10-CM

## 2010-07-03 ENCOUNTER — Inpatient Hospital Stay (HOSPITAL_COMMUNITY)
Admission: EM | Admit: 2010-07-03 | Discharge: 2010-07-05 | DRG: 313 | Disposition: A | Discharge status: Home / Self Care | Payer: PRIVATE HEALTH INSURANCE | Attending: Cardiology | Admitting: Cardiology

## 2010-07-03 ENCOUNTER — Emergency Department (HOSPITAL_COMMUNITY): Payer: PRIVATE HEALTH INSURANCE

## 2010-07-03 DIAGNOSIS — E119 Type 2 diabetes mellitus without complications: Secondary | ICD-10-CM | POA: Diagnosis present

## 2010-07-03 DIAGNOSIS — I2510 Atherosclerotic heart disease of native coronary artery without angina pectoris: Secondary | ICD-10-CM | POA: Diagnosis present

## 2010-07-03 DIAGNOSIS — E785 Hyperlipidemia, unspecified: Secondary | ICD-10-CM | POA: Diagnosis present

## 2010-07-03 DIAGNOSIS — Z6841 Body Mass Index (BMI) 40.0 and over, adult: Secondary | ICD-10-CM

## 2010-07-03 DIAGNOSIS — R0789 Other chest pain: Principal | ICD-10-CM | POA: Diagnosis present

## 2010-07-03 DIAGNOSIS — IMO0002 Reserved for concepts with insufficient information to code with codable children: Secondary | ICD-10-CM

## 2010-07-03 DIAGNOSIS — D649 Anemia, unspecified: Secondary | ICD-10-CM | POA: Diagnosis present

## 2010-07-03 DIAGNOSIS — G4733 Obstructive sleep apnea (adult) (pediatric): Secondary | ICD-10-CM | POA: Diagnosis present

## 2010-07-03 DIAGNOSIS — F341 Dysthymic disorder: Secondary | ICD-10-CM | POA: Diagnosis present

## 2010-07-03 DIAGNOSIS — G4700 Insomnia, unspecified: Secondary | ICD-10-CM | POA: Diagnosis present

## 2010-07-03 DIAGNOSIS — Z9861 Coronary angioplasty status: Secondary | ICD-10-CM

## 2010-07-03 DIAGNOSIS — I10 Essential (primary) hypertension: Secondary | ICD-10-CM | POA: Diagnosis present

## 2010-07-03 DIAGNOSIS — M1990 Unspecified osteoarthritis, unspecified site: Secondary | ICD-10-CM | POA: Diagnosis present

## 2010-07-03 DIAGNOSIS — F429 Obsessive-compulsive disorder, unspecified: Secondary | ICD-10-CM | POA: Diagnosis present

## 2010-07-03 DIAGNOSIS — I252 Old myocardial infarction: Secondary | ICD-10-CM

## 2010-07-03 DIAGNOSIS — M353 Polymyalgia rheumatica: Secondary | ICD-10-CM | POA: Diagnosis present

## 2010-07-03 DIAGNOSIS — Z794 Long term (current) use of insulin: Secondary | ICD-10-CM

## 2010-07-03 DIAGNOSIS — E875 Hyperkalemia: Secondary | ICD-10-CM | POA: Diagnosis present

## 2010-07-03 DIAGNOSIS — E871 Hypo-osmolality and hyponatremia: Secondary | ICD-10-CM | POA: Diagnosis present

## 2010-07-03 DIAGNOSIS — Z79899 Other long term (current) drug therapy: Secondary | ICD-10-CM

## 2010-07-03 DIAGNOSIS — I200 Unstable angina: Secondary | ICD-10-CM

## 2010-07-03 LAB — DIFFERENTIAL
Basophils Absolute: 0 10*3/uL (ref 0.0–0.1)
Basophils Relative: 0 % (ref 0–1)
Eosinophils Absolute: 0.2 10*3/uL (ref 0.0–0.7)
Eosinophils Relative: 1 % (ref 0–5)
Lymphocytes Relative: 12 % (ref 12–46)
Lymphs Abs: 1.9 10*3/uL (ref 0.7–4.0)
Monocytes Absolute: 0.7 10*3/uL (ref 0.1–1.0)
Monocytes Relative: 4 % (ref 3–12)
Neutro Abs: 13.4 10*3/uL — ABNORMAL HIGH (ref 1.7–7.7)
Neutrophils Relative %: 83 % — ABNORMAL HIGH (ref 43–77)

## 2010-07-03 LAB — CK TOTAL AND CKMB (NOT AT ARMC)
CK, MB: 1.9 ng/mL (ref 0.3–4.0)
Relative Index: INVALID (ref 0.0–2.5)
Total CK: 98 U/L (ref 7–177)

## 2010-07-03 LAB — COMPREHENSIVE METABOLIC PANEL
ALT: 17 U/L (ref 0–35)
AST: 20 U/L (ref 0–37)
Albumin: 3.2 g/dL — ABNORMAL LOW (ref 3.5–5.2)
Alkaline Phosphatase: 66 U/L (ref 39–117)
BUN: 15 mg/dL (ref 6–23)
CO2: 22 mEq/L (ref 19–32)
Calcium: 8.7 mg/dL (ref 8.4–10.5)
Chloride: 96 mEq/L (ref 96–112)
Creatinine, Ser: 1.07 mg/dL (ref 0.4–1.2)
GFR calc Af Amer: >60 mL/min (ref >60)
GFR calc non Af Amer: 50 mL/min — ABNORMAL LOW (ref >60)
Glucose, Bld: 312 mg/dL — ABNORMAL HIGH (ref 70–99)
Potassium: 5.8 mEq/L — ABNORMAL HIGH (ref 3.5–5.1)
Sodium: 128 mEq/L — ABNORMAL LOW (ref 135–145)
Total Bilirubin: 0.4 mg/dL (ref 0.3–1.2)
Total Protein: 7.7 g/dL (ref 6.0–8.3)

## 2010-07-03 LAB — D-DIMER, QUANTITATIVE: D-Dimer, Quant: 1.03 ug/mL-FEU — ABNORMAL HIGH (ref 0.00–0.48)

## 2010-07-03 LAB — CBC
HCT: 32.9 % — ABNORMAL LOW (ref 36.0–46.0)
Hemoglobin: 10.2 g/dL — ABNORMAL LOW (ref 12.0–15.0)
MCH: 26.5 pg (ref 26.0–34.0)
MCHC: 31 g/dL (ref 30.0–36.0)
MCV: 85.5 fL (ref 78.0–100.0)
Platelets: 367 10*3/uL (ref 150–400)
RBC: 3.85 MIL/uL — ABNORMAL LOW (ref 3.87–5.11)
RDW: 14.4 % (ref 11.5–15.5)
WBC: 16.1 10*3/uL — ABNORMAL HIGH (ref 4.0–10.5)

## 2010-07-03 LAB — BASIC METABOLIC PANEL
BUN: 15 mg/dL (ref 6–23)
CO2: 26 mEq/L (ref 19–32)
Calcium: 8.9 mg/dL (ref 8.4–10.5)
Chloride: 98 mEq/L (ref 96–112)
Creatinine, Ser: 1.13 mg/dL (ref 0.4–1.2)
GFR calc Af Amer: 57 mL/min — ABNORMAL LOW (ref >60)
GFR calc non Af Amer: 47 mL/min — ABNORMAL LOW (ref >60)
Glucose, Bld: 354 mg/dL — ABNORMAL HIGH (ref 70–99)
Potassium: 5.6 mEq/L — ABNORMAL HIGH (ref 3.5–5.1)
Sodium: 133 mEq/L — ABNORMAL LOW (ref 135–145)

## 2010-07-03 LAB — MRSA PCR SCREENING: MRSA by PCR: NEGATIVE

## 2010-07-03 LAB — GLUCOSE, CAPILLARY
Glucose-Capillary: 254 mg/dL — ABNORMAL HIGH (ref 70–99)
Glucose-Capillary: 315 mg/dL — ABNORMAL HIGH (ref 70–99)

## 2010-07-03 LAB — POTASSIUM: Potassium: 5.6 mEq/L — ABNORMAL HIGH (ref 3.5–5.1)

## 2010-07-03 LAB — PROTIME-INR
INR: 0.94 (ref 0.00–1.49)
Prothrombin Time: 12.8 seconds (ref 11.6–15.2)

## 2010-07-03 LAB — TROPONIN I: Troponin I: 0.01 ng/mL (ref 0.00–0.06)

## 2010-07-04 DIAGNOSIS — R079 Chest pain, unspecified: Secondary | ICD-10-CM

## 2010-07-04 DIAGNOSIS — R042 Hemoptysis: Secondary | ICD-10-CM

## 2010-07-04 LAB — CARDIAC PANEL(CRET KIN+CKTOT+MB+TROPI)
CK, MB: 1.4 ng/mL (ref 0.3–4.0)
CK, MB: 1.6 ng/mL (ref 0.3–4.0)
Relative Index: INVALID (ref 0.0–2.5)
Relative Index: INVALID (ref 0.0–2.5)
Total CK: 81 U/L (ref 7–177)
Total CK: 90 U/L (ref 7–177)
Troponin I: 0.01 ng/mL (ref 0.00–0.06)
Troponin I: 0.02 ng/mL (ref 0.00–0.06)

## 2010-07-04 LAB — BASIC METABOLIC PANEL
BUN: 13 mg/dL (ref 6–23)
CO2: 26 mEq/L (ref 19–32)
Calcium: 8.8 mg/dL (ref 8.4–10.5)
Chloride: 104 mEq/L (ref 96–112)
Creatinine, Ser: 0.99 mg/dL (ref 0.4–1.2)
GFR calc Af Amer: >60 mL/min (ref >60)
GFR calc non Af Amer: 55 mL/min — ABNORMAL LOW (ref >60)
Glucose, Bld: 123 mg/dL — ABNORMAL HIGH (ref 70–99)
Potassium: 4.2 mEq/L (ref 3.5–5.1)
Sodium: 138 mEq/L (ref 135–145)

## 2010-07-04 LAB — GLUCOSE, CAPILLARY
Glucose-Capillary: 135 mg/dL — ABNORMAL HIGH (ref 70–99)
Glucose-Capillary: 129 mg/dL — ABNORMAL HIGH (ref 70–99)
Glucose-Capillary: 193 mg/dL — ABNORMAL HIGH (ref 70–99)
Glucose-Capillary: 304 mg/dL — ABNORMAL HIGH (ref 70–99)

## 2010-07-04 LAB — HEPARIN LEVEL (UNFRACTIONATED)
Heparin Unfractionated: <0.1 IU/mL — ABNORMAL LOW (ref 0.30–0.70)
Heparin Unfractionated: 0.62 IU/mL (ref 0.30–0.70)
Heparin Unfractionated: 0.8 IU/mL — ABNORMAL HIGH (ref 0.30–0.70)

## 2010-07-04 LAB — HEMOGLOBIN A1C
Hgb A1c MFr Bld: 9.4 % — ABNORMAL HIGH (ref <5.7)
Mean Plasma Glucose: 223 mg/dL — ABNORMAL HIGH (ref <117)

## 2010-07-04 LAB — APTT: aPTT: 56 seconds — ABNORMAL HIGH (ref 24–37)

## 2010-07-04 LAB — TSH: TSH: 0.986 u[IU]/mL (ref 0.350–4.500)

## 2010-07-05 LAB — BASIC METABOLIC PANEL
BUN: 12 mg/dL (ref 6–23)
CO2: 26 mEq/L (ref 19–32)
Calcium: 8.8 mg/dL (ref 8.4–10.5)
Chloride: 107 mEq/L (ref 96–112)
Creatinine, Ser: 1 mg/dL (ref 0.4–1.2)
GFR calc Af Amer: >60 mL/min (ref >60)
GFR calc non Af Amer: 54 mL/min — ABNORMAL LOW (ref >60)
Glucose, Bld: 152 mg/dL — ABNORMAL HIGH (ref 70–99)
Potassium: 4.2 mEq/L (ref 3.5–5.1)
Sodium: 140 mEq/L (ref 135–145)

## 2010-07-05 LAB — HEPARIN LEVEL (UNFRACTIONATED): Heparin Unfractionated: 0.35 IU/mL (ref 0.30–0.70)

## 2010-07-05 LAB — CBC
HCT: 29.8 % — ABNORMAL LOW (ref 36.0–46.0)
Hemoglobin: 9.5 g/dL — ABNORMAL LOW (ref 12.0–15.0)
MCH: 27.5 pg (ref 26.0–34.0)
MCHC: 31.9 g/dL (ref 30.0–36.0)
MCV: 86.4 fL (ref 78.0–100.0)
Platelets: 315 10*3/uL (ref 150–400)
RBC: 3.45 MIL/uL — ABNORMAL LOW (ref 3.87–5.11)
RDW: 14.6 % (ref 11.5–15.5)
WBC: 15.1 10*3/uL — ABNORMAL HIGH (ref 4.0–10.5)

## 2010-07-05 LAB — GLUCOSE, CAPILLARY
Glucose-Capillary: 148 mg/dL — ABNORMAL HIGH (ref 70–99)
Glucose-Capillary: 167 mg/dL — ABNORMAL HIGH (ref 70–99)
Glucose-Capillary: 172 mg/dL — ABNORMAL HIGH (ref 70–99)
Glucose-Capillary: 217 mg/dL — ABNORMAL HIGH (ref 70–99)

## 2010-07-06 ENCOUNTER — Other Ambulatory Visit (HOSPITAL_COMMUNITY): Payer: Self-pay

## 2010-07-06 ENCOUNTER — Ambulatory Visit: Payer: Self-pay | Admitting: Cardiology

## 2010-07-07 ENCOUNTER — Ambulatory Visit (HOSPITAL_COMMUNITY): Payer: PRIVATE HEALTH INSURANCE

## 2010-07-11 ENCOUNTER — Telehealth (INDEPENDENT_AMBULATORY_CARE_PROVIDER_SITE_OTHER): Payer: Self-pay | Admitting: *Deleted

## 2010-07-14 NOTE — Procedures (Signed)
[name], [name]               ACCOUNT NO.:  [id number]  MEDICAL RECORD NO.:  [id number]           PATIENT TYPE:  LOCATION:                                 FACILITY:  PHYSICIAN:  Juanda Bond. Burt Knack, MD  DATE OF BIRTH:  May 25, 1934  DATE OF PROCEDURE: DATE OF DISCHARGE:                           CARDIAC CATHETERIZATION   PROCEDURE: 1. Left heart catheterization. 2. Selective coronary angiography. 3. Left ventricular angiography. 4. PTCA and stenting of the right coronary artery.  PROCEDURAL INDICATIONS:  [name] is a morbidly obese 76 year old diabetic woman with coronary disease.  She presented with symptoms concerning for unstable angina and was referred for cardiac catheterization.  Risks and indication of the procedure were reviewed with the patient. Informed consent was obtained.  The right wrist was prepped, draped, and anesthetized with 1% lidocaine.  Using the modified Seldinger technique, a 5-French sheath was placed in the right radial artery.  Unfractionated heparin 6000 units was administered intravenously, 3 mg of verapamil was administered through the sheath.  A TIG catheter was used for angiography of the right and left coronary arteries.  A pigtail catheter was used for ventriculography.  Following the diagnostic procedure, I elected to proceed with PCI of the right coronary artery.  There were tandem lesions in the proximal and mid right coronary artery. The proximal vessel had a moderate 60% stenosis, but the mid vessel had a severe 90% stenosis.  I was confident this was the patient's culprit lesion for her acute coronary syndrome.  Her sheath was upsized to a 6- [place].  Weight-based bivalirudin was used for anticoagulation.  Effient 60 mg was given to the patient while she was on the cath table.  A JR-4 guide catheter was inserted, and a Cougar guidewire was passed into the PDA branch of the right coronary artery.  The vessel was initially predilated  with a 2.5- x 15-mm TREK balloon.  This was taken to 12 atmospheres on two inflations.  There was fairly heavy calcification in the mid and proximal vessel and I thought that I should predilate the vessel little more aggressively to make sure we could get a good stent expansion.  A 3.0- x 20-mm Holly TREK balloon was advanced into the mid vessel and dilated to 12 atmospheres on two inflations.  It was then dilated to 16 atmospheres in the proximal vessel.  It appeared well expanded in all those areas.  The mid vessel was then stented with a 3.5- x 28-mm Promus Element drug-eluting stent.  The stent was carefully positioned and then deployed at 16 atmospheres.  The stent was postdilated with an [name] TREK balloon.  A 4.0- x 20-mm balloon was chosen and it was dilated to 18 atmospheres on two inflation so that the entire stented segment was covered.  The proximal lesion was moderate, but I thought it had the potential to impair inflow into the stented segment in the midportion, so I treated it with 4.0- x 16-mm Promus Element stent.  The stent was carefully positioned and deployed at 16 atmospheres.  The stent was postdilated with a 4.5- x 12-mm [place] TREK balloon  which was taken to 16 atmospheres on the first inflation and 18 atmospheres on the second inflation.  The patient tolerated the procedure well.  There was 0% residual stenosis and TIMI 3 flow at both lesion sites.  The mid lesion was reduced from 90% to 0% and the proximal lesion was reduced from 60% to 0%.  PROCEDURAL FINDINGS:  Aortic pressure 115/78, left ventricular pressure 115/22.  CORONARY ANGIOGRAPHY: 1. The left mainstem is calcified.  There is 20% to 30% distal left     main stenosis. 2. LAD:  The LAD has mild diffuse stenosis.  There is moderate     calcification present.  The vessel gives off two diagonal branches.     The second is the larger branch.  The proximal LAD has 20% to 30%     stenosis.  The mid LAD has 50%  stenosis. 3. Left circumflex:  The left circumflex is also calcified.  The     vessel is patent throughout.  There is nonobstructive stenosis in     the ramus intermedius which is the main branch vessel of the     circumflex.  This has approximately 30% to 40% stenosis. 4. Right coronary artery.  The right coronary artery is a large,     dominant vessel.  There is a 60% proximal stenosis.  The mid vessel     has an eccentric 90% lesion which is markedly progressed from the     previous study.  The PDA and posterolateral branches are both     widely patent.  There is moderate calcification throughout the     right coronary artery. 5. Left ventriculography shows normal LV function.  The ejection     fraction is estimated at 55%.  ASSESSMENT: 1. Severe right coronary artery stenosis with successful percutaneous     intervention using a long drug-eluting stent in the mid vessel and     a 16-mm drug-eluting stents in the proximal vessel. 2. Nonobstructive left main, left anterior descending, and left     circumflex stenoses. 3. Normal left ventricular function.  RECOMMENDATIONS:  The patient should be continued on dual antiplatelettherapy with aspirin and prasugrel for a minimum of 12 months.     Juanda Bond. Burt Knack, MD     MDC/MEDQ  D:  06/22/2010  T:  06/23/2010  Job:  [id number]  cc:   Thomas C. [name], MD, [facility] Janifer Adie, M.D. Satira Sark, MD  Electronically Signed by Sherren Mocha MD on 07/12/2010 08:58:08 PM

## 2010-07-16 ENCOUNTER — Encounter: Payer: Self-pay | Admitting: Cardiology

## 2010-07-18 ENCOUNTER — Ambulatory Visit (HOSPITAL_COMMUNITY)
Admit: 2010-07-18 | Discharge: 2010-07-18 | Disposition: A | Discharge status: Home / Self Care | Payer: PRIVATE HEALTH INSURANCE | Attending: Family Medicine | Admitting: Family Medicine

## 2010-07-18 DIAGNOSIS — D259 Leiomyoma of uterus, unspecified: Secondary | ICD-10-CM | POA: Insufficient documentation

## 2010-07-18 DIAGNOSIS — N840 Polyp of corpus uteri: Secondary | ICD-10-CM | POA: Insufficient documentation

## 2010-07-18 DIAGNOSIS — N950 Postmenopausal bleeding: Secondary | ICD-10-CM | POA: Insufficient documentation

## 2010-07-19 NOTE — Progress Notes (Signed)
Summary: Records Request   Faxed Discharge Summary to [facility] at [place] ([id number]).  Ranell Patrick  July 11, 2010 12:15 PM

## 2010-07-22 NOTE — Discharge Summary (Signed)
[name], [name]               ACCOUNT NO.:  [id number]  MEDICAL RECORD NO.:  [id number]           PATIENT TYPE:  I  LOCATION:  2926                         FACILITY:  [place]  PHYSICIAN:  [name]. Genesi Stefanko, MD, FACCDATE OF BIRTH:  09-04-1934  DATE OF ADMISSION:  07/03/2010 DATE OF DISCHARGE:  07/05/2010                              DISCHARGE SUMMARY   PROCEDURES: 1. Lower extremity Dopplers. 2. Two-view chest x-ray.  PRIMARY FINAL DISCHARGE DIAGNOSES:  Chest pain, cardiac enzymes negative for myocardial infarction, and outpatient followup arranged.  SECONDARY DIAGNOSES: 1. Morbid obesity with a body mass index of 60. 2. Diabetes. 3. Obsessive-compulsive disorder with anxiety/depression. 4. Osteoarthritis. 5. Insomnia. 6. Diverticulitis. 7. Polymyalgia rheumatica, on chronic prednisone. 8. Hypertension. 9. Hyperlipidemia. 10.History of cholelithiasis. 11.History of hyperkalemia. 12.History of anemia. 13.History of gastric bypass with reversal, cataract surgery, C-     section, appendectomy, and cardiac catheterization. 14.Admission June 21, 2010 through June 23, 2010 with unstable     angina, status post drug-eluting stent placement to the proximal     and mid right coronary artery, otherwise nonobstructive disease. 15.History of elevated D-dimer in 2010 with CT angiogram negative for     pulmonary embolism.  TIME AT DISCHARGE:  38 minutes.  HOSPITAL COURSE:  [name] is a 76 year old female with a history of coronary artery disease.  She was discharged on June 23, 2010 and came back to the hospital on July 03, 2010 with chest pain.  She was admitted for further evaluation and treatment.  Her cardiac enzymes were negative for MI.  Hemoglobin A1c was 9.4.  A D- dimer was elevated at 1.03.  Because of her history of a previous elevated D-dimer with negative CT angiogram, CT of the chest was not performed.  She did have lower extremity Dopplers, which  were negative for DVT or superficial thrombosis.  Her blood sugars were managed medically while she was here.  Her potassium was elevated at 5.8 on admission and this was followed.  It improved to 4.2 by discharge.  She is not on potassium supplementation.  On July 05, 2010, [name] was evaluated by Dr. [name].  Her chest pain had resolved and dyspnea on exertion was felt to be at baseline.  Dr. [name] felt that if her ambulatory ability was at baseline, she could be safely discharged home in stable condition, to follow up as an outpatient.  DISCHARGE INSTRUCTIONS: 1. Her activity level is to be increase gradually. 2. She is encouraged to stick to a low-sodium diabetic diet. 3. She is to follow up with Dr. [name] on March 26 at 11:45 a.m. 4. She is to follow up with Dr. Burnice Logan and Dr. Bradd Burner as needed.  DISCHARGE MEDICATIONS: 1. Prednisone 5 mg 2 tablets daily. 2. Sudafed 30 mg is discontinued. 3. Bupropion 150 mg, start with 1 tablet daily for 2 weeks and then 2     tablets daily as prior to admission. 4. Prozac 40 mg a day. 5. Meclizine 25 mg q.6 h. P.r.n. 6. Alprazolam 0.5 mg b.i.d. p.r.n. as prior to admission. 7. Lopressor 25 mg 1-1/2 tablets b.i.d. 8.  Toprol-XL is discontinued. 9. Metformin 850 mg t.i.d. 10.Lantus 68 units nightly. 11.Iron 325 mg daily. 12.Imdur 30 mg a day. 13.Sublingual nitroglycerin p.r.n. 14.Meloxicam 75 mg daily p.r.n. 15.Reglan 10 mg q.i.d. p.r.n. 16.Allegra 180 mg daily p.r.n. 17.Ventolin inhaler p.r.n. 18.Amaryl 4 mg daily. 19.Aspirin 81 mg daily. 20.Effient 10 mg daily. 21.Pravachol 20 mg daily.     Rosaria Ferries, PA-C   ______________________________ [name] [name], MD, [facility]    RB/MEDQ  D:  07/05/2010  T:  07/06/2010  Job:  [id number]  cc:   Janifer Adie, M.D. Marletta Lor, MD  Electronically Signed by Rosaria Ferries PA-C on 07/11/2010 06:58:59 AM Electronically Signed by Jenell Milliner MD [facility] on 07/22/2010  09:17:19 AM

## 2010-07-25 NOTE — H&P (Signed)
[name], [name]               ACCOUNT NO.:  [id number]  MEDICAL RECORD NO.:  [id number]           PATIENT TYPE:  I  LOCATION:  2926                         FACILITY:  [place]  PHYSICIAN:  Ezzard Standing, M.D.DATE OF BIRTH:  01-01-1935  DATE OF ADMISSION:  07/03/2010                             HISTORY & PHYSICAL   HISTORY:  This is a 76 year old severely morbidly obese female with diabetes mellitus, hypertension, obstructive sleep apnea, hyperlipidemia, and coronary artery disease.  She previously had an inferior wall infarction in 2004 and had a bare-metal stent placed in the right coronary artery.  She was recently admitted to the hospital on June 21, 2010, with a prolonged episode of chest discomfort.  She has had atypical chest pain with drinking soda and was sent to the emergency room and EKG was unremarkable and enzymes were negative.  She underwent cardiac catheterization by the brachial approach on the 15th by Dr. Sherren Mocha with findings of a severe stenosis in the mid right coronary artery and tandem proximal stenosis of 60%.  Dr. Burt Knack stented the mid right coronary artery with a 28-mm drug-eluting stent and a proximal 16-mm drug-eluting stent for a total of 44 mm of stenting.  She was discharged on the 17th on a complex and extensive medical regimen.  Since going home, she had an episode of vaginal bleeding several days ago which was different from her usual dark pureed- type complaints.  Today, she was laying around and had the onset of substernal chest discomfort associated with shortness of breath lasting an hour, took a nitroglycerin, and decided to come to the emergency room.  She also complained that the left side of her chest swelled up and turned red.  Since being in the emergency room, she had some sharp chest pain lasting less than a few seconds.  She is admitted at this time for evaluation of prolonged chest discomfort.  She does not currently  have an active pain at this time.  Initial EKG showed sinus tachycardia with no acute ST abnormality.  Initial cardiac enzymes were normal.  PAST MEDICAL HISTORY:  Complex.  She has a history of obsessive- compulsive disorder, anxiety and depression, severe osteoarthritis, severe morbid obesity with previous gastric bypass surgery that has reversed in the past, insomnia, diverticulitis, polymyalgia rheumatica on chronic prednisone therapy, hypertension, hyperlipidemia, diabetes, cholelithiasis known previously, and some hyperkalemia.  She was treated for hyperkalemia previously in the past.  She was also anemic in the past.  PAST SURGICAL HISTORY:  Cataract surgery, C-section, and appendectomy. She has had previous gastric bypass with reversal in the past.  SOCIAL HISTORY:  She is separated from her husband.  Does not use alcohol or tobacco.  Lives at home with her 2 daughters.  Previously worked as a Solicitor.  FAMILY HISTORY:  Father died at age 22 with hypertension, diabetes, arthritis, and throat cancer.  Mother died at age of 68 with breast cancer.  No premature cardiac disease history.  REVIEW OF SYSTEMS:  She was just discharged on the 17th, so pertinent review of systems that are abnormal are remarkable for cramping in  her lower abdomen that has been present since her catheterization.  In addition, she has severe arthritis involving her knee.  She has had some bright red blood that she says is new from her vagina.  She has significant malaise and fatigue.  She is severely dyspneic when she walks for any distance, but this is not really changed recently.  Other than as noted above, the remainder of review of systems is unremarkable.  PHYSICAL EXAMINATION:  GENERAL:  She is an extremely large black female with a BMI of greater than 60. VITAL SIGNS:  Her blood pressure is currently 130/80.  Pulse is 110 and regular. SKIN:  Warm and dry. ENT:  EOMI.   PERRLA.  C and S clear.  Fundi not examined.  Pharynx is negative. NECK:  Supple without masses.  There is no thyromegaly or JVD noted. LUNGS:  Decreased breath sounds, but clear bilaterally. CARDIOVASCULAR:  Distant heart sounds.  Normal S1 and S2.  No S3 or murmur. ABDOMEN:  Massive with no acute abnormality noted. EXTREMITIES:  Legs are quite large.  Pulses were present and 2+.  There was no edema and Homans sign was negative.  Two-view chest x-ray showed normal heart size and clear lung fields.  LABORATORY DATA ON ADMISSION:  White count of 16,100, hemoglobin of 10.2, and hematocrit 32.9.  Sodium is 128 with potassium 5.6, glucose is 312.  Initial MB and troponin were normal.  EKG shows sinus tachycardia with no acute ST abnormality.  IMPRESSION: 1. Prolonged chest discomfort following placement of 2 drug-eluting     stents in the right coronary artery consistent with unstable     angina. 2. Atypical left chest pain and "chest swelling" which is likely     noncardiac. 3. Coronary artery disease with placement of 2 drug-eluting stents for     a total length to 44 mm in the right coronary artery. 4. Recent vaginal bleeding. 5. Severe morbid obesity. 6. Diabetes mellitus, insulin dependent. 7. History of depression, obsessive-compulsive disorder, and anxiety. 8. Hyperlipidemia. 9. Previous cholelithiasis. 10.Hyperkalemia. 11.Anemia, type undetermined. 12.Polymyalgia rheumatica, previously on prednisone.  RECOMMENDATIONS:  Very difficult to assess at this time.  She could be at risk for pulmonary embolus.  We will obtain a D-dimer.  Her massive body habitus makes diagnostic testing difficult.  We will keep n.p.o., begin her on intravenous heparin, will have further workup per primary physicians.     Ezzard Standing, M.D.     WST/MEDQ  D:  07/03/2010  T:  07/04/2010  Job:  [id number]  cc:   Marletta Lor, MD Janifer Adie, M.D.  Electronically Signed by  Viona Gilmore. Wynonia Lawman M.D. on 07/25/2010 05:01:53 PM

## 2010-08-01 ENCOUNTER — Encounter: Payer: Self-pay | Admitting: Cardiology

## 2010-08-01 ENCOUNTER — Ambulatory Visit (INDEPENDENT_AMBULATORY_CARE_PROVIDER_SITE_OTHER): Payer: PRIVATE HEALTH INSURANCE | Admitting: Cardiology

## 2010-08-01 VITALS — BP 142/75 | HR 77 | Resp 16 | Ht 67.0 in | Wt 385.0 lb

## 2010-08-01 DIAGNOSIS — I2510 Atherosclerotic heart disease of native coronary artery without angina pectoris: Secondary | ICD-10-CM

## 2010-08-01 NOTE — Assessment & Plan Note (Signed)
Stable, no change in meds. ASA and Effient for 11 more months before elective surgery etc.

## 2010-08-01 NOTE — Patient Instructions (Signed)
Your physician recommends that you schedule a follow-up appointment in July 2012 with Dr. [name].

## 2010-08-01 NOTE — Progress Notes (Signed)
   Patient ID: [name], female    DOB: 1934-06-26, 76 y.o.   MRN: [id number]  HPI  [name] returns for the E and M of her CAD. Since her DES was placed to her RCA she has been remarkedly better. She occasionally has some positional discomfort, especially at night when lying down. She sleeps on 3 pillows because of her weight, she denies PND or edema. She wants to have a vaginal polyp removed because of intermittent mild bleeding but she needs Effient and ASA for 11 more months. She says she is fine with waiting, unless bleeding increases.    Review of Systems  All other systems reviewed and are negative.      Physical Exam  Constitutional: She is oriented to person, place, and time.       Morbidly obese.  HENT:  Head: Normocephalic and atraumatic.  Eyes: EOM are normal. Pupils are equal, round, and reactive to light.  Neck: Normal range of motion. Neck supple. No tracheal deviation present. No thyromegaly present.  Cardiovascular: Normal rate, regular rhythm, normal heart sounds and intact distal pulses.   Pulmonary/Chest: Effort normal and breath sounds normal.  Musculoskeletal: She exhibits no edema.  Neurological: She is alert and oriented to person, place, and time.  Skin: Skin is warm and dry.  Psychiatric: She has a normal mood and affect.

## 2010-08-10 LAB — GLUCOSE, CAPILLARY
Glucose-Capillary: 218 mg/dL — ABNORMAL HIGH (ref 70–99)
Glucose-Capillary: 226 mg/dL — ABNORMAL HIGH (ref 70–99)

## 2010-09-08 ENCOUNTER — Emergency Department (HOSPITAL_COMMUNITY)
Admission: EM | Admit: 2010-09-08 | Discharge: 2010-09-08 | Disposition: A | Discharge status: Home / Self Care | Payer: PRIVATE HEALTH INSURANCE | Attending: Emergency Medicine | Admitting: Emergency Medicine

## 2010-09-08 ENCOUNTER — Emergency Department (HOSPITAL_COMMUNITY): Payer: PRIVATE HEALTH INSURANCE

## 2010-09-08 DIAGNOSIS — E119 Type 2 diabetes mellitus without complications: Secondary | ICD-10-CM | POA: Insufficient documentation

## 2010-09-08 DIAGNOSIS — J4489 Other specified chronic obstructive pulmonary disease: Secondary | ICD-10-CM | POA: Insufficient documentation

## 2010-09-08 DIAGNOSIS — R079 Chest pain, unspecified: Secondary | ICD-10-CM | POA: Insufficient documentation

## 2010-09-08 DIAGNOSIS — I252 Old myocardial infarction: Secondary | ICD-10-CM | POA: Insufficient documentation

## 2010-09-08 DIAGNOSIS — F329 Major depressive disorder, single episode, unspecified: Secondary | ICD-10-CM | POA: Insufficient documentation

## 2010-09-08 DIAGNOSIS — Z79899 Other long term (current) drug therapy: Secondary | ICD-10-CM | POA: Insufficient documentation

## 2010-09-08 DIAGNOSIS — J449 Chronic obstructive pulmonary disease, unspecified: Secondary | ICD-10-CM | POA: Insufficient documentation

## 2010-09-08 DIAGNOSIS — F3289 Other specified depressive episodes: Secondary | ICD-10-CM | POA: Insufficient documentation

## 2010-09-08 DIAGNOSIS — Z794 Long term (current) use of insulin: Secondary | ICD-10-CM | POA: Insufficient documentation

## 2010-09-08 LAB — CBC
HCT: 32.3 % — ABNORMAL LOW (ref 36.0–46.0)
Hemoglobin: 10.1 g/dL — ABNORMAL LOW (ref 12.0–15.0)
MCH: 26.4 pg (ref 26.0–34.0)
MCHC: 31.3 g/dL (ref 30.0–36.0)
MCV: 84.6 fL (ref 78.0–100.0)
Platelets: 330 10*3/uL (ref 150–400)
RBC: 3.82 MIL/uL — ABNORMAL LOW (ref 3.87–5.11)
RDW: 14.6 % (ref 11.5–15.5)
WBC: 14.8 10*3/uL — ABNORMAL HIGH (ref 4.0–10.5)

## 2010-09-08 LAB — BASIC METABOLIC PANEL
BUN: 17 mg/dL (ref 6–23)
CO2: 30 mEq/L (ref 19–32)
Calcium: 9.4 mg/dL (ref 8.4–10.5)
Chloride: 99 mEq/L (ref 96–112)
Creatinine, Ser: 1.07 mg/dL (ref 0.4–1.2)
GFR calc Af Amer: >60 mL/min (ref >60)
GFR calc non Af Amer: 50 mL/min — ABNORMAL LOW (ref >60)
Glucose, Bld: 142 mg/dL — ABNORMAL HIGH (ref 70–99)
Potassium: 4 mEq/L (ref 3.5–5.1)
Sodium: 138 mEq/L (ref 135–145)

## 2010-09-08 LAB — DIFFERENTIAL
Basophils Absolute: 0 10*3/uL (ref 0.0–0.1)
Basophils Relative: 0 % (ref 0–1)
Eosinophils Absolute: 0.2 10*3/uL (ref 0.0–0.7)
Eosinophils Relative: 2 % (ref 0–5)
Lymphocytes Relative: 26 % (ref 12–46)
Lymphs Abs: 3.9 10*3/uL (ref 0.7–4.0)
Monocytes Absolute: 0.7 10*3/uL (ref 0.1–1.0)
Monocytes Relative: 5 % (ref 3–12)
Neutro Abs: 10 10*3/uL — ABNORMAL HIGH (ref 1.7–7.7)
Neutrophils Relative %: 67 % (ref 43–77)

## 2010-09-08 LAB — POCT CARDIAC MARKERS
CKMB, poc: 1.4 ng/mL (ref 1.0–8.0)
Myoglobin, poc: 173 ng/mL (ref 12–200)
Troponin i, poc: <0.05 ng/mL (ref 0.00–0.09)

## 2010-09-09 ENCOUNTER — Emergency Department (HOSPITAL_COMMUNITY): Payer: PRIVATE HEALTH INSURANCE

## 2010-09-09 LAB — GLUCOSE, CAPILLARY: Glucose-Capillary: 117 mg/dL — ABNORMAL HIGH (ref 70–99)

## 2010-09-12 ENCOUNTER — Encounter (HOSPITAL_COMMUNITY): Payer: Medicare (Managed Care)

## 2010-09-14 ENCOUNTER — Encounter (HOSPITAL_COMMUNITY): Payer: Medicare (Managed Care)

## 2010-09-15 ENCOUNTER — Ambulatory Visit (INDEPENDENT_AMBULATORY_CARE_PROVIDER_SITE_OTHER): Payer: PRIVATE HEALTH INSURANCE | Admitting: Physician Assistant

## 2010-09-15 ENCOUNTER — Encounter: Payer: Self-pay | Admitting: *Deleted

## 2010-09-15 ENCOUNTER — Encounter: Payer: Self-pay | Admitting: Physician Assistant

## 2010-09-15 VITALS — BP 114/64 | HR 72 | Resp 20 | Ht 65.0 in | Wt 358.1 lb

## 2010-09-15 DIAGNOSIS — R079 Chest pain, unspecified: Secondary | ICD-10-CM

## 2010-09-15 DIAGNOSIS — I2510 Atherosclerotic heart disease of native coronary artery without angina pectoris: Secondary | ICD-10-CM

## 2010-09-15 MED ORDER — PANTOPRAZOLE SODIUM 40 MG PO TBEC: 40.0000 mg | DELAYED_RELEASE_TABLET | Freq: Two times a day (BID) | ORAL | Status: DC

## 2010-09-15 MED ORDER — ISOSORBIDE MONONITRATE ER 30 MG PO TB24: 60.0000 mg | ORAL_TABLET | Freq: Every day | ORAL | Status: DC

## 2010-09-15 NOTE — Progress Notes (Signed)
History of Present Illness: Primary Cardiologist:  Dr. Jenell Milliner  [name] is a 76 y.o. female With a history of CAD, status post bare-metal stent to the RCA in 2004 and drug-eluting stent placement to the RCA in 06/2009 in the setting of unstable angina with overall preserved LV function.  She presented to the emergency room 5/3 from cardiac rehabilitation secondary to chest pain.  She was apparently given nitroglycerin with relief.  She had cardiac enzymes negative x1.  Other labs:Na 138, K 4, Creat 1.07, Gluc 142, Hgb 10.1 and CXR with low lung volumes and bronchitic changes.  She returns for follow up.  Her chest symptoms on 5/3 started while at rest.  She describes it as severe pain in her left chest that went up to her jaw.  She does not feel as though it's reminiscent of her previous angina.  She has noted some shortness of breath.  She does not feel as though her breathing is as severe as it was when she underwent PCI.  She denies syncope.  Her pain on 5/3 was severe enough that she was unable to call.  She sleeps an incline.  This has not changed.  She denies any significant change in pedal edema.  She denies any exertional symptoms.  Past Medical History  Diagnosis Date  . COPD (chronic obstructive pulmonary disease)   . Diabetes mellitus   . Diverticular disease   . Hypertension   . Hyperlipidemia   . Coronary artery disease     a. s/p IMI 2004 tx with BMS to RCA;  b. s/p Promus DES to RCA 2/12 (cath: LM 20-30%, pLAD 20-30%, mLAD 50%, RI 30%, pRCA 60%, mRCA 90% - tx with PCI);  c. myoview 1/07: EF 49%, inf scar, no isch  . Urinary incontinence   . Insomnia   . Osteoarthritis   . Morbid obesity   . Obstructive sleep apnea   . Polymyalgia rheumatica   . Anxiety   . Arthritis   . Depression   . Cholelithiasis     Current Outpatient Prescriptions  Medication Sig Dispense Refill  . Albuterol Sulfate (VENTOLIN HFA IN) Inhale into the lungs as directed.        [name] ALPRAZolam  (XANAX) 0.25 MG tablet Take 0.25 mg by mouth 2 (two) times daily.        [name] aspirin 81 MG EC tablet Take 81 mg by mouth daily.        [name] buPROPion (WELLBUTRIN SR) 150 MG 12 hr tablet Take 150 mg by mouth 2 (two) times daily.        [name] docusate sodium (COLACE) 100 MG capsule Take 100 mg by mouth 2 (two) times daily.        . ferrous sulfate 325 (65 FE) MG tablet Take 325 mg by mouth daily with breakfast.        . FLUoxetine (PROZAC) 40 MG capsule Take 40 mg by mouth daily.        [name] glimepiride (AMARYL) 4 MG tablet Take 4 mg by mouth daily before breakfast.        . HYDROcodone-acetaminophen (VICODIN) 5-500 MG per tablet Take 1 tablet by mouth every 6 (six) hours as needed.        . Insulin Aspart (NOVOLOG FLEXPEN Angelina) Inject into the skin as directed.        . insulin glargine (LANTUS) 100 UNIT/ML injection Inject into the skin as directed.        [name]  isosorbide mononitrate (IMDUR) 30 MG 24 hr tablet Take 30 mg by mouth daily.        . meclizine (ANTIVERT) 25 MG tablet Take 25 mg by mouth as needed.        . metFORMIN (GLUCOPHAGE) 850 MG tablet Take 850 mg by mouth 3 (three) times daily.        . metoprolol (TOPROL-XL) 50 MG 24 hr tablet 1/2 tab po bid      . nitroGLYCERIN (NITROSTAT) 0.4 MG SL tablet Place 0.4 mg under the tongue every 5 (five) minutes as needed.        . pantoprazole (PROTONIX) 40 MG tablet Take 40 mg by mouth daily.        . prasugrel (EFFIENT) 10 MG TABS Take 10 mg by mouth daily.        . pravastatin (PRAVACHOL) 20 MG tablet Take 20 mg by mouth daily.        . predniSONE (DELTASONE) 5 MG tablet Take 10 mg by mouth daily.        . pseudoephedrine (SUDAFED) 30 MG tablet Take 30 mg by mouth every 4 (four) hours as needed.        . tolnaftate (TINACTIN) 1 % powder Apply topically as directed.        . triamcinolone (KENALOG) 0.5 % ointment Apply topically 2 (two) times daily.        [name] zolpidem (AMBIEN) 10 MG tablet Take 5 mg by mouth at bedtime as needed.        [name] DISCONTD:  ALPRAZolam (XANAX) 0.5 MG tablet Take 0.5 mg by mouth as needed.        [name] DISCONTD: metFORMIN (GLUCOPHAGE) 1000 MG tablet Take 1,000 mg by mouth 3 (three) times daily.         Allergies  Allergen Reactions  . Codeine   . Sulfonamide Derivatives     History  Substance Use Topics  . Smoking status: Never Smoker   . Smokeless tobacco: Not on file  . Alcohol Use: No    ROS:  See the history of present illness.  She denies fevers, chills, cough, melena, hematochezia.  She is having some abdominal pain as well as dysphagia.  All other systems reviewed and negative.  Vital Signs: BP 114/64  Pulse 72  Resp 20  Ht 5\' 5"  (1.651 m)  Wt 358 lb 1.9 oz (162.442 kg)  BMI 59.59 kg/m2  PHYSICAL EXAM: Well nourished, well developed, in no acute distress HEENT: normal Neck: no JVD At 90 Cardiac:  normal S1, S2; RRR; no murmur, distant heart sounds Lungs:  clear to auscultation bilaterally, no wheezing, rhonchi or rales Abd: soft, nontender Ext: Trace bilateral edema Skin: warm and dry Neuro:  CNs 2-12 intact, no focal abnormalities noted  EKG:  Sinus rhythm, heart rate 72, normal axis, nonspecific ST-T wave changes  ASSESSMENT AND PLAN:

## 2010-09-15 NOTE — Assessment & Plan Note (Signed)
As noted, increased isosorbide.  Continue aspirin and Effient.  She can return to cardiac rehabilitation.

## 2010-09-15 NOTE — Assessment & Plan Note (Signed)
Her symptoms are somewhat atypical.  I discussed further testing which would include stress testing versus cardiac catheterization.  I do not think that stress testing would provide much yield given her size.  Cardiac catheterization would be significantly risky.  She is not interested in proceeding with further testing.  After a long discussion with her and her daughters, she has opted for advancing her medical therapy.  Given her recent gastrointestinal symptoms, I suspect this may be playing a role.  I will have her increase her isosorbide to 60 mg a day.  She will also increase her protonix to 40 mg twice a day.  I advised her to take her protonix 30 minutes prior to meals.  She can follow up with me in 2 weeks to reassess her symptoms.  I discussed the plan today with Dr. [name] who is in agreement.

## 2010-09-15 NOTE — Patient Instructions (Addendum)
Your physician recommends that you schedule a follow-up appointment in: [place], PA-C ON DAY DR. WALL IS IN THE OFFICE PER SCOTT WEAVER, PA-C.  Your physician has recommended you make the following change in your medication: INCREASE ISOSORBIDE 60 MG DAILY, INCREASE PROTONIX 40 MG TWICE DAILY.  YOU HAVE BEEN GIVEN A NOTE TO RETURN TO CARDIAC REHAB.

## 2010-09-16 ENCOUNTER — Encounter (HOSPITAL_COMMUNITY): Payer: Medicare (Managed Care)

## 2010-09-19 ENCOUNTER — Other Ambulatory Visit: Payer: Self-pay | Admitting: Family Medicine

## 2010-09-19 ENCOUNTER — Telehealth: Payer: Self-pay | Admitting: Physician Assistant

## 2010-09-19 ENCOUNTER — Encounter (HOSPITAL_COMMUNITY): Payer: Medicare (Managed Care) | Attending: Family Medicine

## 2010-09-19 DIAGNOSIS — Z9884 Bariatric surgery status: Secondary | ICD-10-CM | POA: Insufficient documentation

## 2010-09-19 DIAGNOSIS — G4733 Obstructive sleep apnea (adult) (pediatric): Secondary | ICD-10-CM | POA: Insufficient documentation

## 2010-09-19 DIAGNOSIS — J4489 Other specified chronic obstructive pulmonary disease: Secondary | ICD-10-CM | POA: Insufficient documentation

## 2010-09-19 DIAGNOSIS — IMO0002 Reserved for concepts with insufficient information to code with codable children: Secondary | ICD-10-CM | POA: Insufficient documentation

## 2010-09-19 DIAGNOSIS — Z79899 Other long term (current) drug therapy: Secondary | ICD-10-CM | POA: Insufficient documentation

## 2010-09-19 DIAGNOSIS — I10 Essential (primary) hypertension: Secondary | ICD-10-CM | POA: Insufficient documentation

## 2010-09-19 DIAGNOSIS — E119 Type 2 diabetes mellitus without complications: Secondary | ICD-10-CM | POA: Insufficient documentation

## 2010-09-19 DIAGNOSIS — F429 Obsessive-compulsive disorder, unspecified: Secondary | ICD-10-CM | POA: Insufficient documentation

## 2010-09-19 DIAGNOSIS — Z794 Long term (current) use of insulin: Secondary | ICD-10-CM | POA: Insufficient documentation

## 2010-09-19 DIAGNOSIS — R5381 Other malaise: Secondary | ICD-10-CM | POA: Insufficient documentation

## 2010-09-19 DIAGNOSIS — J449 Chronic obstructive pulmonary disease, unspecified: Secondary | ICD-10-CM | POA: Insufficient documentation

## 2010-09-19 DIAGNOSIS — I252 Old myocardial infarction: Secondary | ICD-10-CM | POA: Insufficient documentation

## 2010-09-19 DIAGNOSIS — Z7982 Long term (current) use of aspirin: Secondary | ICD-10-CM | POA: Insufficient documentation

## 2010-09-19 DIAGNOSIS — I200 Unstable angina: Secondary | ICD-10-CM | POA: Insufficient documentation

## 2010-09-19 DIAGNOSIS — Z9861 Coronary angioplasty status: Secondary | ICD-10-CM | POA: Insufficient documentation

## 2010-09-19 DIAGNOSIS — Z7902 Long term (current) use of antithrombotics/antiplatelets: Secondary | ICD-10-CM | POA: Insufficient documentation

## 2010-09-19 DIAGNOSIS — E785 Hyperlipidemia, unspecified: Secondary | ICD-10-CM | POA: Insufficient documentation

## 2010-09-19 DIAGNOSIS — I2510 Atherosclerotic heart disease of native coronary artery without angina pectoris: Secondary | ICD-10-CM | POA: Insufficient documentation

## 2010-09-19 DIAGNOSIS — F341 Dysthymic disorder: Secondary | ICD-10-CM | POA: Insufficient documentation

## 2010-09-19 DIAGNOSIS — Z5189 Encounter for other specified aftercare: Secondary | ICD-10-CM | POA: Insufficient documentation

## 2010-09-19 LAB — GLUCOSE, CAPILLARY
Glucose-Capillary: 203 mg/dL — ABNORMAL HIGH (ref 70–99)
Glucose-Capillary: 143 mg/dL — ABNORMAL HIGH (ref 70–99)

## 2010-09-19 NOTE — Telephone Encounter (Signed)
Lov,12 faxed to [place] @  [phone]  09/19/10/km

## 2010-09-20 NOTE — Assessment & Plan Note (Signed)
[facility]                            CARDIOLOGY OFFICE NOTE   [name], [name]                      MRN:          [id number]  DATE:11/28/2006                            DOB:          April 03, 1935    [name] returns today for further management of her coronary disease  and chest pain.   She was admitted to [facility] on 7/11 after having chest pain off and  on all day. It was described as a tightness.   It was ultimately relieved with nitroglycerin and O2.   She was ruled out for myocardial infarction. She was discharged with  follow up scheduled with me.   She has coronary disease status post MI in 2004. She has a stent to the  proximal right coronary artery. She had a catheterization in April 2006,  10% in stent restenosis and 50% LAD lesion. She had a Myoview in January  2007 that showed no ischemia. She had some inferior scar, EF 49%.   Her ongoing risk factors are: Type 2 diabetes which is not controlled;  hemoglobin a1C 8.5% during an admission. She is extremely overweight and  is very sedentary. She has hypertension and hyperlipidemia. She has  obstructive sleep apnea.   She has had very little discomfort since discharge.   She has a hiatal hernia and carries Prevacid SolTabs. She did not use  one of those the day she was having the tab.   Her medicines are:  1. Fluoxetine 40 mg daily.  2. Metformin 1000 mg b.i.d.  3. Glimepiride 4 mg daily.  4. Furosemide 40 mg daily.  5. Meclizine 25 mg p.r.n.  6. Metoprolol 50 mg daily.  7. Isosorbide mononitrate 30 mg daily.  8. Lantus nightly.  9. Aspirin 81 mg daily.   She carries nitroglycerine but needs a renewal. She carries the Prevacid  SolTabs.   PHYSICAL EXAMINATION:  VITAL SIGNS:  Blood pressure 144/70, pulse 68 and  regular. Weight is 317.  GENERAL:  She is very pleasant.  SKIN:  Warm and dry.  HEENT:  Unchanged.  NECK:  Carotid upstrokes are equal bilaterally without  JVD. Thyroid is  not enlarged.  LUNGS:  Clear.  HEART:  Reveals a regular rate and rhythm, soft S1, S2. No rub.  ABDOMEN:  Obese, organomegaly could not be assessed.  EXTREMITIES:  No edema. Pulses are intact. There is no DVT.   ASSESSMENT AND PLAN:  Ms. Brutus has known coronary disease. She has been  having chest pain all day even if relieved by nitroglycerin, could have  been gastroesophageal. I have advised her on this at length and talked  to her and her family about how to handle this in the future. I have  asked her to continue to use nitroglycerin and I have renewed it as  needed p.r.n. We have also talked using Prevacid SolTabs as well.   At this point in time, I do not think that we need to do any further  objective assessment. She really does not like the adenosine Myoviews.  Certainly, catheterization is not indicated.  We plan on seeing her back again in three months.     Thomas C. [name], MD, [facility]  Electronically Signed    TCW/MedQ  DD: 11/28/2006  DT: 11/29/2006  Job #: [id number]   cc:   Marletta Lor, MD

## 2010-09-20 NOTE — H&P (Signed)
NAMEMARIONA, [name]               ACCOUNT NO.:  [id number]   MEDICAL RECORD NO.:  [id number]          PATIENT TYPE:  EMS   LOCATION:  [facility]                         FACILITY:  [facility]   PHYSICIAN:  Nichole Conception. Wall, MD, FACCDATE OF BIRTH:  [birth year]   DATE OF ADMISSION:  11/16/2006  DATE OF DISCHARGE:                              HISTORY & PHYSICAL   PRIMARY CARE PHYSICIAN:  Dr. Nehemiah Massed.   CHIEF COMPLAINT:  Chest pain.   HISTORY OF PRESENT ILLNESS:  [name] is a 76 year old African  American woman with history of coronary artery disease, morbid obesity  and noncardiac chest pain, who presents with recurrent episode of chest  pressure.  The patient states that she develops substernal chest  pressure once every 2 months; per family, she develops 2 episodes a  month.  Tonight, she developed an 8/10 substernal chest pressure at  10:30 p.m. with associated shortness of breath.  The episode was  identical to prior chest pain admissions.  She tried 1 nitroglycerin  spray with no significant relief; a 2nd nitroglycerin spray improved  chest pain.  She subsequently presented to the emergency room and  received a 3rd nitroglycerin, resolving her chest pain.  She was placed  on heparin IV and has remained chest-pain-free in the emergency room.  She denies any significant exertion.  She states that she just gets up  to go to the bathroom at home, but has not had any significant chest  pain with those episodes.  The patient denies any orthopnea or  paroxysmal nocturnal dyspnea or syncope.  The patient denies any GERD  symptoms, but does not take any antacid prophylaxis.   PAST MEDICAL HISTORY:  1. Coronary artery disease.      a.     Myocardial infarction in 2004, status post proximal RCA       stent.      b.     Cardiac cath in April 2006 demonstrated 40% to 50% RCA, 10%       in-stent restenosis and 50% mid LAD lesion.  The patient was       medically managed.      c.     Myoview in  January 2007 demonstrated no ischemia, but showed       a positive inferior scar.  Ejection fraction was 49%.  2. Diabetes mellitus, type 2.  3. Morbid obesity.  4. Hypertension.  5. Hyperlipidemia.  6. Obstructive sleep apnea with CPAP.  7. Anxiety and depression.  8. Osteoarthritis.   PAST SURGICAL HISTORY:  1. Gastric bypass.  2. Hysterectomy.  3. Appendectomy.   ALLERGIES:  SULFA.   MEDICATIONS:  1. Metformin 1000 mg p.o. b.i.d.  2. Lantus 65 units daily.  3. Imdur 30 mg daily.  4. Prozac 40 mg daily.  5. Xanax 0.5 mg as needed.  6. Vicodin 5/500 mg as needed.  7. Glyburide 4 mg daily.   SOCIAL HISTORY:  The patient lives in [place] with her husband.  She  is retired.  Denies any active tobacco or alcohol use, stating that she  had minimal tobacco use  as a teenager.   FAMILY HISTORY:  Notable for mother expired secondary to breast cancer  and father with throat cancer and hypertension.  She has 4 sisters and 1  brother, many with arthritis and obesity.   REVIEW OF SYSTEMS:  Notable for chronic arthralgias and myalgias and  chest pain as noted above.  The rest of the 12 review of systems were  reviewed and were negative.   PHYSICAL EXAMINATION:  VITAL SIGNS:  Temperature is 97.0, pulse 83,  respiratory rate is 16, blood pressure 107/62.  GENERAL:  The patient is awake, alert and oriented x3 in no acute  distress.  She is morbidly obese.  HEENT:  Normocephalic, atraumatic.  Pupils are equal, round and reactive  to light.  Extraocular muscles are intact.  NECK:  No JVD.  No carotid bruits.  CARDIOVASCULAR:  Demonstrates distant heart sounds and normal S1 and S2.  LUNGS:  Distant breath sounds, but clear to auscultation bilaterally.  ABDOMEN:  Obese.  Positive bowel sounds.  Soft, nontender and non-  distended.  EXTREMITIES:  Trace bilateral lower extremity distal pulses, no cyanosis  or clubbing.  There is trace edema.  NEUROLOGIC:  Cranial nerves II-XII are  grossly intact.  No focal  musculoskeletal or sensory deficits.  SKIN:  Demonstrates no significant rash.   CHEST X-RAY:  Demonstrates no focal disease and no acute cardiopulmonary  process.   EKG:  Demonstrates normal sinus rhythm with a rate of 83, Q waves of 0.5  mm inferiorly, but no ST-T wave changes.   LABORATORY DATA:  White count is 12.0, hemoglobin is 10.6, platelet  count of 326,000.  Creatinine is 1.0.  Troponin is less than 0.05.  CK  is 103, MB is less than 1.   ASSESSMENT AND PLAN:  This is a 76 year old African American woman with  morbid obesity, with a history of coronary artery disease and history of  noncardiac chest pain, who presents with recurrent chest pain.  Recurrent symptoms may reflect microvascular angina.  1. Rule out myocardial infarction.  The patient will be continued on      heparin.  Enzymes will be cycled.  The patient will have her Imdur      increased to 60 mg and Ranexa added on for possible microvascular      angina.  Consider stress test if the patient rules out for      myocardial infarction.  2. Diabetes mellitus:  The patient will be placed on sliding-scale      insulin.  Metformin will be held.  3. Hypertension:  The patient will be restarted on metoprolol at 25 mg      p.o. b.i.d.  4. Gastrointestinal prophylaxis:  The patient will be started on      Prilosec 40 mg daily.      Crista Elliot, MD   Electronically Signed     ______________________________  Nichole Conception. [name], MD, [facility]    RA/MEDQ  D:  11/16/2006  T:  11/16/2006  Job:  [id number]

## 2010-09-20 NOTE — Assessment & Plan Note (Signed)
[facility]                            CARDIOLOGY OFFICE NOTE   PRISCILLA, SOIFER                      MRN:          [id number]  DATE:05/20/2008                            DOB:          Feb 20, 1935    Ms. Stocks comes in today for followup.   She says she is getting along pretty well.  She actually stopped her  metoprolol and isosorbide, actually she did not think she needed it.  She has really not had to take any nitroglycerin except on rare  occasion.   She is fairly immobile with her weight and her orthopedic issues.   Her only cardiovascular meds at present is aspirin 81 mg a day.  She had  been on a statin in the past, but discontinued it.   Her exam today, her blood pressure is 130/80, pulse is 78 and regular,  weight is 387.  HEENT is normal.  Carotids are full without bruits.  Thyroid is not enlarged.  Trachea is midline.  Lungs are clear to  auscultation and percussion.  Heart reveals a soft S1 and S2.  PMI could  not be appreciated.  Abdominal exam was not feasible.  Bowel sounds are  present.  Extremities reveal no significant edema.  Pulses are intact.  Neuro exam is intact.   EKG is essentially normal.   Ms. Mey is stable from our standpoint.  I have restarted her on  metoprolol and explained to her that this would reduce her risk of  having no angina, but of having any coronary event.  She is reluctant to  take medications in general.  We will continue with aspirin and  sublingual nitroglycerin.   I will see her back again in 6 months.     Thomas C. [name], MD, [facility]  Electronically Signed    TCW/MedQ  DD: 05/20/2008  DT: 05/21/2008  Job #: [id number]

## 2010-09-20 NOTE — Assessment & Plan Note (Signed)
[facility]                            CARDIOLOGY OFFICE NOTE   DENYEL, PABLA                      MRN:          [id number]  DATE:02/19/2007                            DOB:          [birth year]    SUBJECTIVE:  The patient returns today for further management of her  chest discomfort and coronary disease.  I saw her November 28, 2006.  She  had been hospitalized and ruled out for myocardial infarction.  She has  not had any recurrent symptoms since then.  Much of it sounds like it  might be her hiatal hernia, her Type 2 diabetes  and early satiety.  We  decided not to do a stress Myoview, which she does not like by the way.  We also decided not to do a catheterization.   Since I saw her last she has only had pain one time requiring  nitroglycerin drip.  Dr. Burnice Logan has placed her on, what sounds  like, some Reglan before meals and at bedtime, which has helped.  She is  also taking the Prevacid Solu tabs as needed.   MEDICATIONS:  The rest of her medicines are unchanged.   OBJECTIVE:  VITAL SIGNS:  On exam today her blood pressure is 110/70,  her pulse is 58 and in sinus brady otherwise her EKG is normal.  Her  weight is 381 pounds.  HEENT:  The head, eyes, ears, nose and throat are unchanged.  BACK:  Carotid upstrokes are equal bilaterally without bruits.  No JVD.  Thyroid is not enlarged.  Trachea is midline.  LUNGS:  The lungs are clear.  HEART:  The heart reveals a regular rate and rhythm.  The PMI cannot be  appreciated.  ABDOMEN:  The abdominal exam is obese with good bowel sounds.  EXTREMITIES:  The extremities reveal no edema.  Pulses are intact.   ASSESSMENT AND PLAN:  The patient is stable from a cardiovascular  standpoint.   I have made no changes in her program.  I think most of her symptoms are  gastrointestinal-related, which Dr. Burnice Logan is appropriately  addressing.   I will see her back in a year.     Thomas C.  [name], MD, [facility]  Electronically Signed    TCW/MedQ  DD: 02/19/2007  DT: 02/20/2007  Job #: [id number]

## 2010-09-20 NOTE — Discharge Summary (Signed)
NAMEREITA, [name]               ACCOUNT NO.:  [id number]   MEDICAL RECORD NO.:  [id number]          PATIENT TYPE:  INP   LOCATION:  6529                         FACILITY:  [place]   PHYSICIAN:  [name]. Wall, MD, FACCDATE OF BIRTH:  1934/09/25   DATE OF ADMISSION:  11/16/2006  DATE OF DISCHARGE:  11/16/2006                               DISCHARGE SUMMARY   DISCHARGING PHYSICIAN:  Dr. Mar Daring.   PRIMARY CARDIOLOGIST:  Dr. [name].   PRIMARY CARE PHYSICIAN:  Nichole Presto, MD.   DISCHARGING DIAGNOSES:  1. Chest pain.  The patient ruled out for myocardial infarction this      admission.  2. Poorly controlled diabetes with a hemoglobin A1c this admission      8.5.  3. Morbid obesity.  4. Hypertension.  5. Hyperlipidemia.  6. Obstructive sleep apnea with CPAP.   PAST MEDICAL HISTORY:  1. Includes coronary artery disease status post MI in 2004, with a      stent to the proximal RCA.  Under this cardiac catheterization      April 2006, 10% in-stent restenosis and 50% mid-LAD lesion, the      patient managed medically.  Under this Myoview January 2007, no      ischemia, positive inferior scar.  EF 49%.  2. Anxiety and depression.  3. Osteoarthritis.  4. Status post gastric bypass, hysterectomy and appendectomy.   PROCEDURES:  This admission include a chest x-ray showing no active  disease.   HOSPITAL COURSE:  [name] is a 76 year old African-American female  followed by Dr. Bluford Kaufmann, who presents this admission  complaining of chest discomfort with known history of coronary artery  disease.  The patient stated she had tried nitroglycerin at home, with  no significant relief.  Second nitroglycerin seemed to improve the  discomfort, and she ultimately presented to the emergency room for  further evaluation.  There, she received a third nitroglycerin which  resolved her chest discomfort.  She was started on heparin and was  eventually pain free.  The patient denies  any GERD symptoms, does not  take any antacid prophylaxis and states she has been told in the past  that she might have some sluggish digestion, because of her diabetes  but states compliance with her medications.  EKG showed sinus rhythm at  a rate of 83.  No ST or T-wave changes.   LAB WORK:  Stable.  Cardiac markers less than 0.05 on troponin.  The  patient was admitted for observation.  Cardiac markers were cycled.  Pending results of the second set of cardiac markers, the patient will  be discharged home with instructions to follow up with Dr. [name]. She  states she already has an appointment with him in August.  The patient  to keep that appointment, possibly may need a repeat stress Myoview.   FOLLOWUP:  Dr. Burnice Logan for further management of diabetes.  At time  of discharge, the patient instructed continue her present 40 mg daily  and glyburide 4 mg daily, metformin 1000 mg p.o. b.i.d., Imdur 30 mg  daily, Xanax p.r.n.  as previously prescribed, Lantus insulin as  previously prescribed (65 units daily).   Duration of discharge encounter less than 30 minutes.      [name], ACNP      [name]. [name], MD, [facility]  Electronically Signed    MB/MEDQ  D:  11/19/2006  T:  11/19/2006  Job:  [id number]   cc:   Marletta Lor, MD

## 2010-09-21 ENCOUNTER — Encounter (HOSPITAL_COMMUNITY): Payer: Medicare (Managed Care)

## 2010-09-21 ENCOUNTER — Other Ambulatory Visit: Payer: Self-pay | Admitting: Family Medicine

## 2010-09-21 LAB — GLUCOSE, CAPILLARY
Glucose-Capillary: 281 mg/dL — ABNORMAL HIGH (ref 70–99)
Glucose-Capillary: 270 mg/dL — ABNORMAL HIGH (ref 70–99)

## 2010-09-23 ENCOUNTER — Encounter (HOSPITAL_COMMUNITY): Payer: Medicare (Managed Care)

## 2010-09-23 ENCOUNTER — Other Ambulatory Visit: Payer: Self-pay | Admitting: Family Medicine

## 2010-09-23 LAB — GLUCOSE, CAPILLARY: Glucose-Capillary: 201 mg/dL — ABNORMAL HIGH (ref 70–99)

## 2010-09-23 NOTE — Assessment & Plan Note (Signed)
[facility]                            CARDIOLOGY OFFICE NOTE   TENIOLA, ESPOSITO                      MRN:          [id number]  DATE:06/11/2006                            DOB:          02-23-1935    [name] returns today for management of the following issues:  1. Coronary artery disease.  She denies any chest discomfort except      for when her heart races.  She has profound dyspnea on exertion but      is mostly sedentary.  2. Obesity.  Her weight has remained stable and she has not gained any      further weight since we saw her in August.  She has lost a total of      19 pounds.  3. Hypertension.   Other than the above complaints of palpitations and occasional chest  tightness associated with that, she is doing fairly well.  Her blood  work and risk factors are being followed by Dr. Burnice Logan.  After  careful questioning, it sounds like her blood sugar has been under poor  control.   MEDICATIONS:  1. Metformin 1,000 b.i.d.  2. Amaryl 4 mg a day.  3. Lantus insulin.  4. Metoprolol 25 b.i.d.  5. Imdur 30 mg a day.  6. Prozac 40 mg a day.  7. Meloxicam 7.5 p.o. b.i.d.  8. Glimepiride 4 mg a day.  9. Aspirin 81 mg a day.  10.Lipitor 10 mg q.h.s.   PHYSICAL EXAMINATION:  VITAL SIGNS:  Her blood pressure is 141/75 on the  wrist.  Her pulse is 70 and regular.  Her weight is 395.  HEENT:  Normocephalic atraumatic.  PERRLA.  Extraocular movements  intact.  Sclerae are clear.  Facial symmetry is normal.  NECK:  Supple.  Carotid upstrokes are equal bilaterally without bruits.  There is no JVD.  Thyroid is not enlarged.  Trachea is midline.  LUNGS:  Clear to auscultation.  HEART:  Reveals a soft S1 S2.  PMI could not be appreciated.  ABDOMEN:  Obese with good bowel sounds.  EXTREMITIES:  Reveal 1+ pitting edema.  Pulses are present.  NEUROLOGIC:  Intact.   ASSESSMENT/PLAN:  1. Ms. Landron seems to be doing well.  I am delighted she has  not      gained any further weight back.  I have encouraged her to try and      loose more.  She will follow up closely with Dr. Burnice Logan      concerning her blood sugar and lipids.  2. Because of her palpitations and chest discomfort, I have increased      her metoprolol from 25      twice a day to 50 twice a day.  3. I will plan on seeing her back again in 6 months.     Thomas C. [name], MD, [facility]  Electronically Signed    TCW/MedQ  DD: 06/11/2006  DT: 06/11/2006  Job #: [id number]

## 2010-09-23 NOTE — Cardiovascular Report (Signed)
NAMESHRITA, BROSSEAU               ACCOUNT NO.:  [id number]   MEDICAL RECORD NO.:  [id number]          PATIENT TYPE:  INP   LOCATION:  3710                         FACILITY:  [place]   PHYSICIAN:  Loretha Brasil. Lia Foyer, M.D. [facility] OF BIRTH:  1934/09/27   DATE OF PROCEDURE:  08/18/2004  DATE OF DISCHARGE:                              CARDIAC CATHETERIZATION   INDICATIONS:  Ms. Fratangelo is a 76 year old with morbid obesity.  She weighs in  excess of 400 pounds.  She has had a prior infarct.  She was seen by Dr.  [name] in consult for some chest discomfort.  She also has significant fatigue  and according to the family snores and wakes up a lot at night.  Dr. [name]  saw her in consult and set her up for cardiac catheterization.   PROCEDURE:  1.  Left heart catheterization.  2.  Selective coronary arteriography.   DESCRIPTION OF PROCEDURE:  The patient had previously been studied from the  right brachial approach.  We used a short brachial sheath and gave 1500  units of heparin.  Standard Judkins catheters were utilized to engage both  the left and right coronary arteries.  It was difficult seeing the right  coronary artery well largely because of the patient's size and inability to  move the camera laterally in either direction even using high fluoro.  We  got the best images we could possibly get in multiple views to try to better  lay out the vessel.  I reviewed the films with the patient's afterwards and  explained to them the various scenarios to the patient.  Overall, she  tolerated the procedure well.   She was taken to the holding area for direct manual compression.   HEMODYNAMIC DATA:  1.  Central aortic pressure 174/91.  2.  Left ventricular pressure 174/22.  3.  No gradient on pullback across the aortic valve.   ANGIOGRAPHIC DATA:  1.  The left main is free of critical disease.  2.  The LAD is heavily calcified proximally with a fair amount of luminal      irregularity.  After  the major diagonal there is about 50% narrowing but      none of this appears to be critical.  3.  The circumflex consists of predominantly one very large marginal branch      and other than minor luminal irregularity it is free of critical      disease.  4.  The right coronary artery is a large caliber vessel.  In an area      previously described by Dr. Olevia Perches is 10%.  There is about 40% focal      narrowing.  In the mid vessel there is a calcified area with a fair      amount of calcification and hypodensity.  The hypodensity may, in fact,      be related to the calcification but it is difficult to be certain.  This      looks slightly more worrisome in the LAO views but in the RAO views  there appears to be a good channel and the stenosis would be measured at      about 40% or so.  I reviewed the films with Dr. Haroldine Laws and also Dr.      Domenic Polite to try and confirm this and we were all in agreement that the      right did not appear to be critical.   Ventriculography was not performed.   CONCLUSIONS:  1.  Minor irregularities of the left coronary system with calcification as      noted above and a 50% left anterior descending stenosis.  2.  40% proximal and 40-50% mid narrowing in the right coronary artery as      noted above.   DISPOSITION:  The patient has morbid obesity.  She is not a surgical  candidate.  After multiple reviewers looked at her right coronary artery we  were not inclined to try to intervene given the potential risks.  It did not  appear to be high grade.  Optimal views could not be obtained because of the  patient's size and inability to penetrate.  I would recommend medical  therapy but most importantly I would recommend a sleep study.  I would  suspect a lot of her dysfunction may be related to significant sleep apnea  given the patient's history.      TDS/MEDQ  D:  08/18/2004  T:  08/18/2004  Job:  [id number]   cc:   [name]. Wall, M.D.   [facility]

## 2010-09-23 NOTE — Cardiovascular Report (Signed)
NAME:  [name], [name]                         ACCOUNT NO.:  [id number]   MEDICAL RECORD NO.:  [id number]                   PATIENT TYPE:  INP   LOCATION:  2025                                 FAC[facility]  [facility]   PHYSICIAN:  Ludwig Lean. Doreatha Lew, M.D.            DATE OF BIRTH:  12/22/1934   DATE OF PROCEDURE:  02/26/2002  DATE OF DISCHARGE:                              CARDIAC CATHETERIZATION   HISTORY:  The patient is a 76 year old female with morbid obesity, insulin-  dependent diabetes mellitus, hypertension, and recurrent chest pain.  She is  admitted with chest pain, had myocardial infarction ruled out.  She is  referred for catheterization for evaluation of underlying coronary disease.   PROCEDURES:  Left heart catheterization with selective coronary angiography,  left ventricular angiography, albeit a brachial approach.   TYPE AND SITE OF ENTRY:  Right brachial approach.   CATHETERS:  A 6 French right coronary catheter, 6 French pigtail  ventriculographic catheter, a 6 [place] Castillo II curved catheter.   CONTRAST MATERIAL:  Omnipaque.   MEDICATIONS GIVEN PRIOR TO THE PROCEDURE:  Valium 10 mg p.o.   MEDICATIONS GIVEN DURING THE PROCEDURE:  None.   COMMENTS:  The patient tolerated the procedure well.   HEMODYNAMIC DATA:  The aortic pressure was 124/69,  LV was 116/5-9. There  was no aortic valve gradient noted on pullback.   ANGIOGRAPHIC DATA:  1. Left main coronary artery:  Normal.  2. Left circumflex:  The left circumflex continues primarily as an obtuse     marginal. There is 30% narrowing in the midportion of the obtuse     marginal.  3. Left anterior descending:  The left anterior descending is a somewhat     tortuous vessel that crosses the apex.  There is a large third diagonal     vessel present.  The left anterior descending appears to be normal.  4. Right coronary artery:  The right coronary artery is a large dominant     vessel.  There is 30% narrowing  proximally and then 50-60% narrowing in     the midportion of the vessel.  The distal vessel is relatively large.     Distal vessel is free of significant obstructive disease.   LEFT VENTRICULAR ANGIOGRAM:  Left ventricular angiogram was performed in the  RAO position.  The overall cardiac size and silhouette are normal.  The  global left ventricular ejection fraction was normal with an ejection  fraction of 65-70%.  There is no mitral regurgitation, intracardiac  calcification or intracavitary filling defect.   OVERALL IMPRESSION:  1. Normal left ventricular function.  2. Moderate atherosclerosis of the right coronary artery with mild     atherosclerosis in the left circumflex.   DISCUSSION:  It is felt that we can best manage the patient with medical  therapy.  She does have coronary atherosclerosis and it does not appear to  be obstructive  in nature at this point in time.  She clearly is at increased  risk for problems with her diabetes and morbid obesity.  We will encourage  to modify cardiovascular risk factors.                                                 Ludwig Lean. Doreatha Lew, M.D.    SNT/MEDQ  D:  02/26/2002  T:  02/26/2002  Job:  [id number]   cc:   Darrick Penna. Swords, M.D. [facility]

## 2010-09-23 NOTE — Discharge Summary (Signed)
NAME:  [name], [name]                         ACCOUNT NO.:  [id number]   MEDICAL RECORD NO.:  [id number]                   PATIENT TYPE:  INP   LOCATION:  6523                                 FACILITY:  [facility]   PHYSICIAN:  Christy Sartorius, M.D.                   DATE OF BIRTH:  03/24/1935   DATE OF ADMISSION:  12/03/2002  DATE OF DISCHARGE:  12/05/2002                           DISCHARGE SUMMARY - REFERRING   BRIEF HISTORY:  This is a 76 year old female with a history of an MI  approximately two weeks prior to this admission treated with PTCA stenting  of the right coronary artery.  The patient was admitted to [facility] on December 03, 2002 with recurrent chest pain.   PAST MEDICAL HISTORY:  She is status post appendectomy, hysterectomy,  history of gastric bypass.  She has coronary artery disease with a PCI  performed in [place] approximately two weeks ago for an acute MI.  She has  a history of hypertension, diabetes mellitus, osteoarthritis, depression,  and she is status post C-section.   ALLERGIES:  1. CODEINE.  2. SULFA.   SOCIAL HISTORY:  The patient is married.  She has three children and seven  grandchildren.  She does not use alcohol or tobacco.   FAMILY HISTORY:  Her mother died from breast CA.  Her father is alive at age  49 with osteoarthritis.  She has one brother and one sister, both of them  have arthritis.   HOSPITAL COURSE:  As noted, this patient was admitted to [facility]  for further evaluation of chest pain.  She had an MI approximately two weeks  ago in [place] and then underwent PTCA stenting of the right coronary  artery.  The decision was made to recatheterize the patient.  This was  performed on December 04, 2002 by  Dr. Eustace Quail.  The patient was found to have normal coronary arteries  except for the right coronary artery which had a less than 10% stenosis at  the previous stent site.  She had a 30% proximal RCA lesion as well.   There  was some inferior hypokinesis of the left ventricle with an ejection  fraction approximately 50%-55%.   The patient is to be treated medically.  Lipitor and Altace were added to  her current medications and arrangements were made to discharge the patient  on December 05, 2002 in improved and stable condition.  Of note, the  catheterization was performed using a brachial access.   DISCHARGE MEDICATIONS:  The patient was told to take Lipitor 20 mg daily and  Altace 2.5 mg daily.  Her other medications were to be the same as prior to  admission including:  1. Evista 60 mg daily.  2. Plavix 75 mg daily.  3. Lopressor 25 mg b.i.d.  4. Glucophage 1000 mg b.i.d. was to be restarted on  Sunday following her     Thursday catheterization.  5. Amaryl 4 mg daily.  6. Insulin Lantus 35 units q.h.s.  7. Meclizine 25 mg q.i.d. p.r.n.  8. Paxil 20 mg daily.  9. Wellbutrin SR 150 mg b.i.d.  10.      Cyclobenzaprine 10 mg t.i.d. p.r.n. muscle spasms.  11.      She was told she could take Tylenol as needed for pain.   ACTIVITY:  The patient was told to avoid any strenuous activity or driving  for at least two days.  She was told not to lift more than 10 pounds for one  week.   DIET:  She was to be on a low-salt/low-fat diabetic diet.   SPECIAL INSTRUCTIONS:  She was told to call the office if she had any  increased pain, swelling, or bleeding from her catheterization site.   FOLLOW UP:  She was to see Dr. Burnice Logan as needed or as scheduled, Dr.  Vicenta Aly August 17 at 11:15 a.m.   PROBLEM LIST AT THE TIME OF DISCHARGE:  1. Cardiac catheterization this admission revealing no significant stenosis     with patent right coronary artery stent, ejection fraction 50%-55% with     mild hypokinesis.  2. Recent myocardial infarction approximately two weeks ago with     percutaneous transluminal coronary angioplasty stent of the right     coronary artery performed in [name].  3. Status post  multiple surgeries including gastric bypass surgery in 1977.  4. History of hypertension.  5. History of diabetes.  6. Osteoarthritis.  7. Depression.      Mikey Bussing, P.A. [facility]                  Christy Sartorius, M.D.    DR/MEDQ  D:  12/05/2002  T:  12/05/2002  Job:  [id number]   cc:   Marletta Lor, M.D. [facility]

## 2010-09-23 NOTE — Consult Note (Signed)
NAMECHRISTYN, [name]               ACCOUNT NO.:  [id number]   MEDICAL RECORD NO.:  [id number]          PATIENT TYPE:  INP   LOCATION:  [place]                         FACILITY:  [place]   PHYSICIAN:  [name]. Wall, M.D.   DATE OF BIRTH:  05-24-1934   DATE OF CONSULTATION:  08/17/2004  DATE OF DISCHARGE:                                   CONSULTATION   REFERRING PHYSICIAN:  Colon Branch, M.D.   We were asked by Dr. Larose Kells to evaluate [name] with chest pressure and  bilateral ear pain for a month.   She has known CAD, status post MI and stent of the right coronary in 2004.  She was recatheterized two weeks after MI and she had a patent stent.   She has multiple cardiac risk factors, including insulin-dependent diabetes,  hypertension, morbid obesity and hyperlipidemia.  There is no family history  of premature coronary disease.  She does not smoke.   Nitroglycerin at home has relieved it.  She has basically ignored it, but  call the office yesterday.  She was admitted last evening by Dr. Larose Kells.   Her POC enzymes were negative x 3.  Her D-dimer was up at 0.8, but her VQ  was negative.  Her chest x-ray shows cardiomegaly.  EKG shows no acute  changes.  The hemoglobin is 9.9.  Her creatinine is 1.1, potassium 4.2 and  glucose 223.   ALLERGIES:  She is intolerant of CODEINE and SULFA.   PAST MEDICAL HISTORY:  In addition to the above, she has:  1.  Osteoarthritis.  2.  A history of depression.   PAST SURGICAL HISTORY:  Multiple surgeries with:  1.  Gastric bypass.  2.  Hysterectomy.   REVIEW OF SYSTEMS:  She has had lower extremity edema.  She has had some  heartburn.   FAMILY HISTORY:  Noncontributory.   SOCIAL HISTORY:  Negative tobacco or alcohol.   MEDICATIONS:  1.  Mobic, unknown dose.  2.  Paxil XR 25 mg daily.  3.  Wellbutrin two pills a day, unknown dose.  4.  Lipitor 40 mg a day.  5.  Lopressor, unknown dose.  6.  Micardis, unknown dose.  7.  Without aspirin  regularly.  8.  Lantus 50 units a day.  9.  Flexeril p.r.n.  10. Lorazepam, unknown dose.  11. Metformin 1000 mg b.i.d.  12. Allegra.  13. Meclozine.  14. Sublingual nitroglycerin.   PHYSICAL EXAMINATION:  GENERAL APPEARANCE:  She is in no acute distress.  Very pleasant.  VITAL SIGNS:  In the emergency room, the blood pressure was 171/85.  Her  pulse is 75 and regular.  Her temperature is 98.2 degrees.  Her respiratory  rate is 18.  Her O2 saturation is 97% on room air.  She is alert and  oriented x 3.  NEUROLOGIC:  Intact.  HEENT:  PERRLA.  Extraocular movements intact.  The sclerae are clear.  NECK:  Large and JVD could not be determined.  Carotid upstrokes were brisk  bilaterally without bruits.  LUNGS:  Clear.  HEART:  Regular rate  and rhythm without murmur, rub or gallop.  ABDOMEN:  Soft with good bowel sounds.  She is markedly obese.  EXTREMITIES:  Her upper extremity pulses are intact.  Femoral pulses were  not palpated.  Distal pulses are intact.  There is no edema.   ASSESSMENT:  1.  Unstable angina with known coronary artery disease, status post      myocardial infarction and stent to the right coronary artery.  Her      symptoms are rather classic, including the bilateral ear pain.  2.  Other problems as listed above.   RECOMMENDATIONS:  1.  Discontinue nitroglycerin paste and give IV nitroglycerin three drops.  2.  Discontinue Lovenox and begin IV heparin per pharmacy.  3.  Admit to telemetry.  4.  Hold metformin.  5.  NPO past midnight for catheterization.  The indications, risks and      potential benefits were discussed with the patient.  She agrees to      proceed.      TCW/MEDQ  D:  08/17/2004  T:  08/17/2004  Job:  [id number]   cc:   [facility].

## 2010-09-23 NOTE — H&P (Signed)
NAME:  [name], [name]                         ACCOUNT NO.:  [id number]   MEDICAL RECORD NO.:  [id number]                   PATIENT TYPE:  EMS   LOCATION:  [place]                                 FACILITY:  [place]   PHYSICIAN:  Darrick Penna. Swords, M.D. [facility]           DATE OF BIRTH:  Jul 17, 1934   DATE OF ADMISSION:  02/24/2002  DATE OF DISCHARGE:                                HISTORY & PHYSICAL   CHIEF COMPLAINT:  Chest pain.   HISTORY OF PRESENT ILLNESS:  The patient is a 76 year old female with  multiple medical problems and a complicated medical patient.  She describes  intermittent chest pressure associated with occasional sharp chest pain and  left arm pain, shortness of breath, and headache over the past two weeks.  She denies nausea, vomiting, or diaphoresis.   She states that over the past two weeks she has noticed a dramatic increase  in nitroglycerin use.  The chest pressure is responsive to nitroglycerin  therapy.  Twice over the past four days, she has had intense chest pain  associated with left arm pain and shortness of breath.  Each episode was  relieved with nitroglycerin.  This evening, she was concerned with another  episode of chest discomfort also relieved by nitroglycerin.  Chest  discomfort is not exertional.  It typically occurs with rest.   Her risk factors include her age, morbid obesity, hypertension, diabetes,  and a family history.   PAST MEDICAL HISTORY:  Significant for heart disease.  This was diagnosed  by Dr. Ludwig Lean. [place].  Mechanism of diagnosis not known.  She has  osteoarthritis and type 2 diabetes as well as hypertension.   PAST SURGICAL HISTORY:  Appendectomy, C-section, intestinal bypass surgery  which was reversed two years later.   CURRENT MEDICATIONS:  1. Imdur 30 mg p.o. q.d.  2. Norvasc 2.5 mg p.o. q.d.  3. Lantus 50 units q.h.s.  4. Paxil 20 mg p.o. q.d.   FAMILY HISTORY:  Mother deceased with breast cancer.  Father alive with  osteoarthritis at the age of 45.  One brother and sister all with arthritis.   SOCIAL HISTORY:  She is married.  She has three children.  She is a  nonsmoker and does not drink alcohol and she is not working.   REVIEW OF SYMPTOMS:  She denies any current chest pain, shortness of breath,  PND, orthopnea.  She denies any lower extremity edema.  She denies any  nausea, vomiting, diaphoresis.  She denies any neurologic deficit.  She  denies any other complaints in the review of systems other than those listed  above.   PHYSICAL EXAMINATION:  VITAL SIGNS:  Temperature 99.1, respiratory rate 18,  heart rate 110, blood pressure 154/89 on admission to the emergency  department.  At the time of my examination, the heart rate is 80, blood  pressure is 130/70, respirations are 14.  GENERAL:  In  general, she appears as a morbidly obese female in no acute  distress.  HEENT:  Normocephalic and atraumatic.  Extraocular movements intact.  NECK:  Supple without lymphadenopathy, thyromegaly, jugular venous  distention, or carotid bruits.  CHEST:  Clear to auscultation without any increase work of breathing or  dullness to percussion.  HEART:  Distant heart sounds.  S1 and S2 sounds regular.  There is no  significant murmur or gallop.  PMI is not palpable.  ABDOMEN:  Obese.  Active bowel sounds.  Soft and nontender.  There is no  hepatosplenomegaly.  No masses are palpated.  EXTREMITIES:  There is trace pretibial edema.  NEUROLOGICAL:  She is alert and oriented and moves all four extremities  without difficulty.   LABORATORY DATA:  CBC significant for a white count of 10.7, hemoglobin  11.4, and platelet count of 302,000.  Troponin I 0.01 with slight hemolysis  present.  CMET with slight hemolysis.  Sodium is 133, potassium is 6.1,  glucose 229, bilirubin 2.1.  A PTT is 31 seconds.   EKG demonstrates normal sinus rhythm with an incomplete right bundle branch  block.   ASSESSMENT/PLAN:  1. Chest  pain concern with unstable angina:  We will admit to the hospital     to rule out myocardial infarction.  I think it is reasonable to keep her     on her current medications as she is pain-free.  We will add an aspirin a     day.  We will not heparinize at this time.  We will heparinize for any     recurrent chest pain.  I will have Dr. Ludwig Lean. Doreatha Lew see the     patient.  The patient may need a cardiac catheterization but may not be a     candidate given her size.  2. Hyperkalemia, likely hemolysis:  We will repeat that in the morning.  3. Leukocytosis:  Currently no signs of infection.  No further evaluation     necessary.  4. Anemia:  We will hemoccult stools.  We will perform iron studies.  The     patient may need further outpatient evaluation if appropriate.  5. Hyperbilirubenemia:  We will repeat and follow on an outpatient if     necessary.                                               Bruce Lemmie Evens Swords, M.D. [facility]    BHS/MEDQ  D:  02/24/2002  T:  02/25/2002  Job:  [id number]   cc:   Marletta Lor, M.D. [facility]

## 2010-09-23 NOTE — Procedures (Signed)
NAMEMarland [name]  DANYLLE, SPILLER NO.:  [id number]   MEDICAL RECORD NO.:  [id number]          PATIENT TYPE:  OUT   LOCATION:  SLEEP CENTER                 FACILITY:  [facility]   PHYSICIAN:  Danton Sewer, M.D. [facility] DATE OF BIRTH:  01/16/1935   DATE OF STUDY:  09/07/2004                              NOCTURNAL POLYSOMNOGRAM   REFERRING PHYSICIAN:  Dr. Bluford Kaufmann   DATE OF STUDY:  Sep 07, 2004   INDICATION FOR THE STUDY:  Hypersomnia with sleep apnea. Epworth score: 7.   SLEEP ARCHITECTURE:  The patient had a total sleep time of 260 minutes with  decreased REM and slow wave sleep. Sleep onset latency was prolonged at 45  minutes and REM onset was very prolonged at 312 minutes.   IMPRESSION:  1.  Split-night study reveals severe obstructive sleep apnea with 252      obstructive events noted in the first 127 minutes of sleep. This gave      the patient a respiratory disturbance index of 119 events per hour with      oxygen desaturation as low as 86%. Events were not positional but were      definitely associated with moderate snoring. By split-night protocol the      patient was then placed on a small ComfortClassic Nasal Mask and      eventually titrated to a final pressure of 10 cm with good control of      events.  2.  No clinically significant cardiac arrhythmias.  3.  Very large numbers of leg jerks with significant sleep disruption.      Clinical correlation is suggested.      KC/MEDQ  D:  09/15/2004 16:00:22  T:  09/15/2004 20:07:33  Job:  [id number]

## 2010-09-23 NOTE — H&P (Signed)
NAMEMarland [name]  Nichole, [name] NO.:  [id number]   MEDICAL RECORD NO.:  [id number]          PATIENT TYPE:  INP   LOCATION:  [id number]                         FACILITY:  [facility]   PHYSICIAN:  Colon Branch, MD [facility]    DATE OF BIRTH:  09-16-1934   DATE OF ADMISSION:  08/16/2004  DATE OF DISCHARGE:                                HISTORY & PHYSICAL   PRIMARY CARE PHYSICIAN:  Marletta Lor, M.D.  from [facility].   CHIEF COMPLAINT:  Chest pressure.   HISTORY OF THE PRESENT ILLNESS:  [name] is a 76 year old African-  American female with a history of diabetes, coronary artery disease and  morbid obesity who presented to the ER with a several-day history of chest  pressure.  The pressure is located at the left mid chest with radiation to  the left neck and left ear.  The pain is on and off.  It decreased with  nitroglycerin and she has noticed no obvious triggers.  There is no  associated nausea or diaphoresis.  Also, for the last two weeks she has been  noticing dyspnea on exertion.  This is a completely new symptom to her  according to the patient.   In the emergency room V/Q scan was negative and the decision was made to  admit the patient for observation.   PAST MEDICAL HISTORY:  1.  Insulin-dependent diabetes, poorly controlled with the last hemoglobin A-      1-C about 8.  2.  Coronary artery disease status post MI and a stent to the RCA in 2004.      The patient was recathed two weeks later and they found that the stent      was patent.  3.  Hypertension.  4.  Osteoarthritis.  5.  Depression.  6.  Multiple surgeries including a hysterectomy and a gastric bypass in      1977, which was reversed in 1979.   FAMILY HISTORY:  The family history is noncontributory.   SOCIAL HISTORY:  The patient does not smoke or drink.   REVIEW OF SYSTEMS:  The patient denies any fever, chills, cough, vomiting,  or diarrhea.  She does have constant heartburn without any  dysphagia or  odynophagia.  She does not take any routine medicines for heartburn, except  for occasional TUMS.   MEDICATIONS:  The medication list is obtained from the ER records.  I have  no dosages for most of the medications.  1.  Mobic.  2.  Paxil probably CR 25 mg once a day.  3.  Wellbutrin 2 pills a day; dose unclear.  4.  Lipitor 40 mg a day.  5.  Lopressor, dose unknown.  6.  Micardis, dose unknown.  7.  Lantus 50 units q.h.s.  8.  Flexeril p.r.n.  9.  Lorazepam, dose unknown.  10. Metformin 1,000 mg 1 p.o. b.i.d.  11. Allegra p.r.n.  12. Meclizine p.r.n.  13. Darvocet p.r.n.  14. Nitroglycerin p.r.n.  15. The patient takes a number of over-the-counter NSAIDs including Goody      Powders  and Aleve.   ALLERGIES:  1.  SULFA.  2.  CODEINE.   PHYSICAL EXAMINATION:  GENERAL APPEARANCE:  The patient is alert and  oriented in no apparent distress, cooperative and coherent.  This is a  morbidly obese female.  VITAL SIGNS:  The patient is afebrile, pulse 75, blood pressure 171/85 and  O2 sat on room air is 97%.  NECK:  It is difficult to tell if she has JVD.  LUNGS:  The patient has decreased breath sounds bilaterally, otherwise is  clear.  HEART:  Cardiovascularly she has a regular rate and rhythm.  ABDOMEN:  The abdomen is obese, soft and nontender.  EXTREMITIES:  The patient has 1+ pitting edema bilaterally.  Calves are  symmetric.  NEUROLOGIC EXAMINATION:  Speech and motor seems to be intact.   LABORATORY AND RADIOLOGIC DATA:  Chest x-ray shows cardiomegaly without any  other abnormality.  V/Q scan is reported as normal.  EKG is negative.   Point of care:  Cardiac enzymes are negative times three.  White count 10.6  with a hemoglobin of 9.9 and platelets of 331,000.  BNP is 64.  D-dimer is  slightly elevated to 0.8.  LFTs are normal.  Potassium 4.2, creatinine 1.1  and blood sugar 223.   ASSESSMENT AND PLAN:  1.  The patient is admitted to my service with chest  pain, which has some      typical features, like the fact that it decreased with nitroglycerin.      She also has new onset of dyspnea on exertion and edema.   With a totally normal V/Q scan the patient does not have a pulmonary  embolus.  We will admit her to my service to telemetry, check a couple more  serial enzymes and electrocardiogram, and consult cardiology in the morning.   1.  Diabetes.   We will continue with Lantus and a sliding scale.  I am going to decrease  the baseline Lantus from 50 to 30.   1.  I do not have most of the dosages of her regular medications so I will      give her at this time Lopressor 50 b.i.d., Ativan 0.5 t.i.d., Micardis      40 once a day. Wellbutrin XL 150 1 p.o. daily, until we clarify the      routine doses with the family in the morning.   1.  The patient does have anemia.   On the E chart I noticed that the hemoglobin was 10.9 only two weeks ago.  I  will go ahead and check an iron panel and hemoccult all stools.  If her  hemoglobin has decreased in the last few months that may account for some of  heart rate symptoms.  I will attempt to get the records from the office in  the morning.      JEP/MEDQ  D:  08/17/2004  T:  08/17/2004  Job:  [id number]   cc:   Marletta Lor, M.D. [facility]

## 2010-09-23 NOTE — Discharge Summary (Signed)
[name], [name]               ACCOUNT NO.:  [id number]   MEDICAL RECORD NO.:  [id number]          PATIENT TYPE:  OBV   LOCATION:  2027                         FACILITY:  [facility]   PHYSICIAN:  [name]. Wall, MD, FACCDATE OF BIRTH:  July 29, 1934   DATE OF ADMISSION:  02/02/2006  DATE OF DISCHARGE:  02/03/2006                                 DISCHARGE SUMMARY   DISCHARGING PHYSICIAN:  Dr. Ron Parker   PRIMARY CARDIOLOGIST:  Dr. [name]   PRIMARY CARE:  Dr. Nehemiah Massed   DISCHARGING DIAGNOSES:  1. Chest pain, negative cardiac enzymes.  2. Palpitations.  3. History of coronary artery disease.  4. Elevated D-dimer, chest CT negative for pulmonary embolism.   PAST MEDICAL HISTORY:  Includes:  1. Diabetes type 2.  2. Osteoarthritis.  3. Obstructive sleep apnea with CPAP use.  4. Hypertension.  5. Hyperlipidemia.  6. Anxiety and depression.  7. Obesity.  8. Coronary artery disease with history of MI in 2004 with right coronary      artery lesion treated at that time, last cardiac catheterization April      of 2006, stress Myoview January of 2007 which showed no scarring and no      ischemia and an EF of 49%.   HOSPITAL COURSE:  [name] is a 76 year old female with known history of  coronary artery disease as stated above, also with multiple comorbidities  including a weight of 400 pounds, diabetes, hypertension, hyperlipidemia,  significant osteoarthritis.  Patient was in her usual state of health until  11 a.m. on day of admission while sitting at rest experienced some  palpitations and some chest pressure.  After six hours of continuous pain,  she called our office and was told to come to the emergency room for further  evaluation.  Chest x-ray was without abnormalities.  EKG showed no change.  Cardiac markers were negative.  Patient was admitted for observation and  found to have an elevated D-dimer of 0.63.  CT of the chest performed showed  no evidence of a pulmonary embolism, no  aortic dissection, positive for  gallstones.  Cardiac enzymes continued to remain negative.  The patient was  found to have an elevated WBC of 14.9, hemoglobin and hematocrit 10.2 and  30.4.  Chemistry within normal limits, however with elevated glucose of 137  to 259, total cholesterol 74, triglycerides 48, LDL 23, HDL 41.  Dr. Ron Parker  came to see patient on day of discharge, results of chest CT reviewed, no  further episodes of chest discomfort.  The patient discharged home in stable  condition with instructions to follow up with Dr. [name] within the next two  weeks, patient agrees to call for appointment, his office is closed today  being the weekend.  Patient to continue a heart-healthy diet.   MEDICATIONS:  Include:  1. Aspirin 81 mg daily.  2. Amaryl 4 mg daily.  3. Lopressor 50 mg b.i.d.  4. Imdur 30 mg daily.  5. Lipitor 10 mg daily.  6. Lisinopril 10 mg daily.   Patient instructed to continue her Xanax, Vicodin, Evista and insulin  as  previously instructed.   DURATION OF DISCHARGE ENCOUNTER:  Twenty five minutes.      Rosanne Sack, ACNP      [name]. [name], MD, [facility]  Electronically Signed    MB/MEDQ  D:  02/26/2006  T:  02/26/2006  Job:  [id number]

## 2010-09-23 NOTE — H&P (Signed)
NAME:  [name], [name]                         ACCOUNT NO.:  [id number]   MEDICAL RECORD NO.:  [id number]                   PATIENT TYPE:  INP   LOCATION:  6523                                 FACILITY:  [facility]   PHYSICIAN:  Christy Sartorius, M.D.                   DATE OF BIRTH:  [birth year]   DATE OF ADMISSION:  12/03/2002  DATE OF DISCHARGE:                                HISTORY & PHYSICAL   CHIEF COMPLAINT:  Chest pain.   HISTORY:  [name] is a 76 year old black female with diabetes mellitus,  hypertension, and coronary artery disease who presents with substernal chest  discomfort.  The patient recently underwent percutaneous intervention with  stent placement of the right coronary artery in [place] approximately two  weeks ago when she presented with severe substernal chest pain.  She had  suffered an inferior wall myocardial infarction.  She was subsequently  discharged and has done well until in the afternoon of December 02, 2002, when  at approximately 1:30 p.m., she developed substernal chest discomfort; this  was similar to her prior pain with her infarction, however, not as severe.  She had been taking nitroglycerin all through the day, as the pain waxed and  waned.  Finally, at approximately 1:00 a.m. on the morning of December 03, 2002,  she presented with recurrent substernal chest pressure and a headache.  She  has not had any shortness of breath but has felt tired all day.  She denies  any lower extremity edema, orthopnea, paroxysmal nocturnal dyspnea, syncope,  or presyncope.   REVIEW OF SYSTEMS:  Otherwise noncontributory.   PAST MEDICAL HISTORY:  1. Appendectomy.  2. She is status post a hysterectomy.  3. Gastric bypass in 1977 with reversal in 1979.  4. Coronary artery disease with catheterization on February 26, 2002, by Dr.     Claiborne Billings showing 50% right coronary artery lesion that was medically     managed, status post percutaneous intervention and stent placement of  the     right coronary artery in [place], [place], two weeks ago.  5. Hypertension.  6. Diabetes mellitus, insulin-requiring.  7. Osteoarthritis.  8. Depression.  9. History of C-section.   ALLERGIES:  None.  She has a sensitivity to CODEINE and SULFA.   MEDICATIONS:  1. Evista 60 mg per day.  2. Plavix 75 mg per day.  3. Lopressor 25 mg b.i.d.  4. Glucophage 1000 mg b.i.d.  5. Amaryl 4 mg q.a.m.  6. Insulin, Lantus 35 units q.h.s.  7. Meclizine 25 mg q.i.d. p.r.n.  8. Paxil 20 mg per day.  9. Wellbutrin SR 150 mg b.i.d.  10.      Cyclobenzaprine 10 mg t.i.d. p.r.n.   SOCIAL HISTORY:  The patient is married.  She has three children and seven  grandchildren.  Denies alcohol or tobacco use.  She does not work.  FAMILY HISTORY:  Mother died of breast cancer.  Father is alive with  osteoarthritis at age 77.  She has one brother and one sister, both with  arthritis.   PHYSICAL EXAMINATION:  GENERAL:  She is an overweight black female in no  acute distress.  VITAL SIGNS:  Temperature of 97.8, pulse 68, blood pressure 125/59,  respirations 16, O2 saturation is 96% on room air.  HEENT:  Pupils are equal, round, and reactive to light.  Extraocular muscles  are intact.  Oropharynx shows no lesion.  NECK:  Supple without bruits or adenopathy.  HEART:  Regular rate without murmurs.  LUNGS:  Clear to auscultation.  ABDOMEN:  Soft, nontender.  EXTREMITIES:  No edema.  Peripheral pulses are diminished but palpable.   LABORATORY DATA:  ECG shows normal sinus rhythm at 68.  There are Q waves  inferiorly, consistent with old inferior wall myocardial infarction.  Nonspecific T-wave changes are noted laterally.  Chest x-ray shows no  infiltrates or effusion.   Laboratory exam:  White count is 10.3, hemoglobin 11.5, hematocrit 34.8,  platelets 319.  Sodium 138, potassium 4.8, chloride 105, bicarb 28, BUN 17,  creatinine 1.0, glucose is 94.  LFTs are within normal limits.  CK-MB  is  2.0, troponin-I is less than 0.05, myoglobin is 122.  PT is 12.4, INR is  0.9, PTT is 20.  Urinalysis shows trace leukocyte esterase and positive  nitrites.   ASSESSMENT AND PLAN:  [name] is a 76 year old black female with diabetes  mellitus, obesity, and coronary disease who presents with chest pressure  reminiscent of her prior pain with infarction.  Patient currently does not  have acute electrocardiogram changes and with 12 hours of pain, does not  have elevated cardiac enzymes.  She is currently pain-free with  nitroglycerin.  She will be admitted to the hospital with continued  anticoagulants and control of blood pressure.  She will require repeat  angiography to rule out vessel disruption or progression of disease.  This  was discussed with the patient, who understands and agrees to proceed.  We  will continue her aspirin and Plavix.  The patient would benefit from statin  therapy, and we will initiate this.  The patient supposedly has an  appointment with Dr. Vicenta Aly in August, and we will facilitate followup  with our cardiologist.                                                Christy Sartorius, M.D.    NG/MEDQ  D:  12/03/2002  T:  12/03/2002  Job:  [id number]   cc:   Marletta Lor, M.D. [facility]   Junious Silk, M.D. [facility]

## 2010-09-23 NOTE — Cardiovascular Report (Signed)
NAME:  [name], [name]                         ACCOUNT NO.:  [id number]   MEDICAL RECORD NO.:  [id number]                   PATIENT TYPE:  INP   LOCATION:  6523                                 FACILITY:  [facility]   PHYSICIAN:  Eustace Quail, M.D.                  DATE OF BIRTH:  Aug 06, 1934   DATE OF PROCEDURE:  12/04/2002  DATE OF DISCHARGE:                              CARDIAC CATHETERIZATION   CLINICAL HISTORY:  The patient is 76 years old and two weeks ago had a  diaphragmatic wall infarction while she was living in [place].  She was  treated with emergency stenting of the right coronary artery via the  brachial approach.  She returned to [facility] and had recurrent chest pain  and was hospitalized for this.  Her pain was concerning for ischemia and she  was scheduled for evaluation with angiography.  She was enrolled in the  [facility] and randomized to bivalirudin.   PROCEDURE:  The procedure was performed via the right brachial artery using  an arterial sheath and a #2 6-French brachial catheters for the left  coronary artery and a JR4 for the right coronary artery.  We also performed  left ventriculography.  The patient tolerated the procedure well and left  the laboratory in satisfactory condition.   RESULTS:  Left main coronary artery:  Free of significant disease.   Left anterior descending artery:  Gave rise to a septal perforator and  diagonal branch.  ___________ were free of significant disease.   Circumflex artery:  The circumflex artery gave rise to a small AV branch and  a marginal branch which had two subbranches.  These vessels were free of  significant disease.   Right coronary artery:  The right coronary artery is a moderate-size vessel  that gave rise to a right ventricular branch, a posterior descending branch,  and a posterolateral branch.  There was less than 10% narrowing at the stent  site in the proximal right coronary artery.  There was 30% narrowing  proximal to the stent in the proximal right coronary artery.   LEFT VENTRICULOGRAM:  The left ventriculogram performed in the RAO  projection showed good hypokinesis of the inferobasilar segment.  Left wall  motion was quite good.  The estimated ejection fraction was 55%.   CONCLUSIONS:  Coronary artery disease status post stenting two weeks ago of  the right coronary artery for a diaphragmatic wall infarction with less than  10% narrowing at the stent site in the right coronary artery, 30% narrowing  proximal to the stent, no major obstruction in the circumflex or left  anterior descending arteries, and inferobasilar wall hypokinesis.    RECOMMENDATIONS:  The patient has no evidence of restenosis.  Will plan  reassurance and continue secondary risk factor modification.  Eustace Quail, M.D.    BB/MEDQ  D:  12/04/2002  T:  12/05/2002  Job:  [id number]   cc:   Marletta Lor, M.D. [facility]   Junious Silk, M.D. [facility]

## 2010-09-23 NOTE — Discharge Summary (Signed)
   NAME:  [name], [name]                         ACCOUNT NO.:  [id number]   MEDICAL RECORD NO.:  [id number]                   PATIENT TYPE:  INP   LOCATION:                                       FACILITY:  [facility]   PHYSICIAN:  Christy Sartorius, M.D.                   DATE OF BIRTH:  [birth year]   DATE OF ADMISSION:  12/03/2002  DATE OF DISCHARGE:  12/05/2002                           DISCHARGE SUMMARY - REFERRING   ADDENDUM:   LABORATORY DATA:  CBC on the day of discharge revealed a hemoglobin of 9.9,  hematocrit 29.7, WBC 8,400, platelets 283,000.  A C-reactive protein was  high at 3.0.  Cardiac enzymes were negative.  A lipid profile revealed a  cholesterol of 121, triglycerides 113, HDL 47, LDL 51.   Chest x-ray showed mild vascular congestion with slightly worsening aeration  compared with previous study.   FOLLOW UP:  The patient should have a repeat CBC blood test and seen back in  the office.  She will need a repeat lipid profile and liver function tests  in six to eight weeks.   FURTHER LABS:  A comprehensive metabolic panel on March 28 revealed a BUN of  17, creatinine 1.0, potassium 4.8 and the remainder was within normal limits  as well except for an albumin of 3.1.      Mikey Bussing, P.A. [facility]                  Christy Sartorius, M.D.    DR/MEDQ  D:  12/05/2002  T:  12/05/2002  Job:  [id number]

## 2010-09-23 NOTE — H&P (Signed)
NAMEKIRANDEEP, FASO               ACCOUNT NO.:  [id number]   MEDICAL RECORD NO.:  [id number]          PATIENT TYPE:  OBV   LOCATION:  2027                         FACILITY:  [facility]   PHYSICIAN:  Larina Earthly. Zenia Resides, MD     DATE OF BIRTH:  [birth year]   DATE OF ADMISSION:  02/02/2006  DATE OF DISCHARGE:  02/03/2006                                HISTORY & PHYSICAL   CARDIOLOGIST:  Dr. Jenell Milliner.   PRIMARY MEDICAL DOCTOR:  Dr. Burnice Logan.   CONTACT PERSON:  Husband, Aleijah Runnels at [phone].   CHIEF COMPLAINT:  Chest pain.   HISTORY OF PRESENT ILLNESS:  The patient is a 76 year old female with known  coronary artery disease with preserved ejection fraction.  She also has  multiple other comorbidities including a weight of 400 pounds, diabetes type  2, hypertension, hyperlipidemia, and significant osteoarthritis.  She was  last seen in clinic on December 20, 2005 in routine followup and was doing  well at that point in time.  She had not had recent chest pain and she had  lost 19 pounds.   She was in her usual state of health until 11 a.m. today while sitting  resting.  She had onset of palpitations and fast heart rate associated with  onset of chest pressure located in the left substernal region.  It was 8-  9/10 in maximum severity.  There is no radiation.  No nausea or vomiting.  No diaphoresis.  She did have some anxiety associated with it.  She has had  some chest pains like this in the past and they have gone away.  She tried  some nitroglycerin but it was from the Year 2000 and had no effect.  After 6  hours of continuous pain, she finally called the [facility] Cardiology office.  They told the patient to get some different nitroglycerin and then to report  to the emergency department.  She did use nitroglycerin spray which she had  in her house and had fairly rapid resolution of her chest pain to 2/10 in  severity.  She has had constant 2/10 chest pain since that time.  She says  that the  symptoms she had early in the day around noon with severe pain  were similar to a myocardial infarction.  However, the pain she has now she  says is nonspecific.  Her husband brought her to the emergency department.  Here she had mild hypertension but was comfortable.  EKG showed no change.  Troponin was negative.  Chest x-ray without abnormality.  She is admitted to  the cardiology service.   PAST MEDICAL HISTORY:  1. Coronary artery disease.  History of  myocardial infarction in 2004      with RCA lesion treated at that time.  Her last cardiac catheterization      was April 2006 which showed 40-50% RCA lesion as well as a 50% mid-LAD      lesion which was thought to be noncritical and was treated medically.      She most recently had a Myoview January 2007 which showed no  scar and      no ischemia and had an ejection fraction of 49%.  2. Diabetes type 2, on insulin for the last 2-3 years.  Most recent      hemoglobin A1c was 7.3%.  3. Hypertension.  4. Hyperlipidemia.  5. Osteoarthritis, particularly in the right arm and leg.  She also has a      torn rotator cuff and has had recent steroid injection into the left      shoulder.  6. Anxiety and depression.  7. Obesity, greater than 400 pounds in the past, currently 393 pounds.  8. Obstructive sleep apnea, currently using nocturnal CPAP.   ALLERGIES:  SULFA.   MEDICATIONS CURRENTLY:  1. Meloxicam 7.5 mg b.i.d.  2. Evista 60 mg nightly.  3. Amaryl 4 mg in the morning.  4. Lantus 55 units nightly.  5. Imdur 30 mg daily.  6. Lipitor 10 mg daily.  7. Lopressor 50 mg b.i.d.  8. Vicodin one tablet as needed.  9. Xanax 0.25 mg as needed.  10.Aspirin 81 mg daily.   SOCIAL HISTORY:  The patient lives in [name] with her husband.  She is  retired and is on [employer].  She has three grown children, two of whom live  locally.  She smoked minimally as a teenager but does not smoke currently,  does not use alcohol or drugs.   She has been trying to increase her activity  level recently and her activity tolerance has been good without significant  chest pain.   FAMILY HISTORY:  [name] mother died of breast cancer.  Father is  alive but has arthritis, hypertension and recent diagnosis of throat cancer.  She has four sisters and one brother, many of whom have obesity and  arthritis.   REVIEW OF SYSTEMS:  Positive for the chest pain as noted above.  Otherwise  negative x 14 systems in detail.   CODE STATUS:  Full code.   PHYSICAL EXAMINATION:  Temperature 98, pulse 98, respirations 24, blood  pressure 171/86, saturation 99% on 2 liters.  She was in no distress.  No  significant rash.  No jaundice.  She had an upper denture in place but her  lower teeth looked okay and she had no oral lesions.  No carotid bruits.  JVP appeared flat.  No thyromegaly.  Lungs clear to auscultation  bilaterally.  Heart was regular rate and rhythm with a mildly distant S1-S2,  no appreciable murmur, no S4.  Her abdomen was very obese, soft with distant  bowel sounds.  Her right groin pulse was difficult to palpate.  She did have  normal PT pulse in the right leg.  There is no bruit in the right groin.  Neurologically she is intact.   X-RAYS:  Chest x-ray was reviewed and showed no acute abnormality.   ELECTROCARDIOGRAM:  EKG showed normal sinus rhythm at 97 with normal axis,  normal intervals, RSR primed in V1, small inferior Q-waves, no significant  ST-T changes except for some nonspecific T-wave inversion in L.   LABORATORIES:  Hematocrit 36, sodium 136, potassium 4.9, bicarb 25, BUN 23,  glucose 259, MB 1.6, troponin less than 0.05.  TSH 1.7, A1c 7.3,  microalbumin negative.   IMPRESSION:  A 76 year old female with atypical chest pain.   PLAN:  1. Chest pain.  Diagnosis is unstable angina.  However, etiology is     unclear and her symptoms were atypical.  She has a negative troponin  despite 6 hours of constant  chest pain.  Certainly GERD would be a      possibility.  She also complained of some tachyarrhythmia sensation      during the chest pain and that could have caused her symptomatology.      She has no evidence of heart failure on examination and did not      complain of significant shortness of breath or orthopnea.  She had no      chest wall pain.  At this point in time we will admit her to the      telemetry unit.  She will get another troponin at 6 hours.  I am      loading her on Plavix given her history of coronary disease and      possible diagnosis of unstable angina, as the patient is not a surgical      candidate as previously noted.  We will continue the aspirin.  No 2b/3a      given that she has no troponin positivity and no EKG changes.  Continue      beta blocker.  Add ACE inhibitor for known coronary disease and      hypertension.  We will up her nitrates to an Imdur dose of 60 given      chest pain.  I would consider stress adenosine-Cardiolite but given the      overall picture I think doing that as an outpatient in the next week or      two may be reasonable.  One could consider an event monitor if she      continues to have palpitation symptoms.  I have also started her on a      proton pump inhibitor as this may be GERD particularly with her      obesity.  2. Hypertension.  An ACE inhibitor has been added.  Given her African      American status hydrochlorothiazide or a calcium channel blocker could      also be considered.  She really should have a blood pressure followed      and medications adjusted as needed.  3. Diabetes type 2.  Moderate control with an A1c of 7.3.  ADA diet.      Continue Amaryl and Lantus.  ACE inhibitor added although her      microalbumin is negative.  4. Osteoarthritis.  She says this is her biggest health problem.  We will      need to hold the meloxicam in the setting of possible unstable angina      as NSAIDs raise her acute risk.  She  will get Tylenol plus or minus      narcotics as needed.  5. Obstructive sleep apnea.  Continue CPAP if possible.  6. Prophylaxis.  Out of bed.  Proton pump inhibitor.  Diabetic diet.  7. Disposition.  Hopefully observation admission with possible discharge      tomorrow, or potentially to be kept in hospital for stress test on      Monday.           ______________________________  Larina Earthly. Zenia Resides, MD     LAA/MEDQ  D:  02/02/2006  T:  02/04/2006  Job:  [id number]   cc:   Marletta Lor, MD

## 2010-09-23 NOTE — Discharge Summary (Signed)
[place]  Patient:    [name], [name]                      MRN: [id number] Adm. Date:  [id number] Disc. Date: 05/31/00 Attending:  Rosezetta Schlatter Dictator:   Helayne Seminole, P.A. CC:         Marletta Lor, M.D.   Discharge Summary  DISCHARGE DIAGNOSES: 1. Fever. 2. Leukocytosis. 3. Escherichia coli urinary tract infection. 4. Hyperglycemia.  BRIEF ADMISSION HISTORY:  Ms. Standrew is a 76 year old African-American female who presents with a 10-day history of dark malodorous urine associated with urinary frequency and dysuria.  She developed fever and presented to the emergency room.  PAST MEDICAL HISTORY:  1. Adult onset diabetes mellitus, currently insulin dependent.  2. Severe osteoarthritis effecting her back and knees. 3. Coronary artery disease.  4. Morbid obesity.  5. Status post appendectomy. 6. Status post intestinal bypass in 1977 with a reversal in 1979.  LABORATORY DATA ON ADMISSION:  The creatinine was 1.4.  Glucose 285.  White count 26,900 with 94% neutrophils.  The urine revealed too numerous to count white cells.  HOSPITAL COURSE: #1 - INFECTIOUS DISEASE:  The patient presented with fever, leukocytosis, and evidence of a urinary tract infection.  She was admitted to rule out urosepsis.  She was empirically started on Cipro and gentamicin.  The patient defervesced and her white count began normalizing.  The urine culture did reveal Escherichia coli and this was sensitive to Cipro.  After three days, the gentamicin was discontinued.  She was started on oral Cipro.  At discharge, the patient was afebrile and her white count was normal.  Again, she states that she has had multiple urinary tract infections over the past year.  The Escherichia coli was not resistant, however.  We did not do any other urological work-up as in a renal ultrasound.  The patient may need to be referred to a urologist.  Of note, the  patient is morbidly obese and a poorly controlled diabetic.  #2 - HYPERGLYCEMIA IN AN INSULIN-REQUIRING DIABETIC:  Initially the patients scheduled NPH and Glucophage were held as she was essentially NPO.  However, her blood sugars climbed to the 200s and 300s.  At one point, she was started on Humalog 18 units t.i.d. with meals still without good control of her blood sugar.  We did check a hemoglobin A1C and this was elevated at 9.6%.  At this point, we have placed the patient back on her regular home insulin regimen.  #3 - MILD HYPONATREMIA LIKELY SECONDARY TO DEHYDRATION:  This has corrected.  #4 - MILD RENAL INSUFFICIENCY:  Also likely secondary the dehydration which has corrected.  LABORATORY DATA AT DISCHARGE:  The white count was 8.4.  The urine culture revealed Escherichia coli greater than 100,000 colonies/ml sensitive to Cipro. Blood cultures revealed no growth to date x 2.  The hemoglobin was 10.  The sodium was 134.  The BUN was 16 and the creatinine was 1.  LFTs were normal.  MEDICATIONS AT DISCHARGE:  The patient has been instructed to resume her home medications, which we have listed as Paxil 20 mg q.d., Insulin Regular 4 units in the morning and NPH 36 units in the morning and then Insulin Regular 4 units in the evening and NPH 20 units in the evening, Glucophage 500 mg b.i.d., Celebrex 200 mg b.i.d., Norvasc 2.5 mg q.d., Imdur 20 mg q.d., Evista 60 mg q.d., and  Cipro 500 mg b.i.d. for six days.  FOLLOW-UP:  The patient is to follow up with Marletta Lor, M.D., in two to three weeks. DD:  05/31/00 TD:  05/31/00 Job: [id number]

## 2010-09-23 NOTE — Assessment & Plan Note (Signed)
[facility]                              CARDIOLOGY OFFICE NOTE   [name], [name]                      MRN:          [id number]  DATE:12/20/2005                            DOB:          16-Mar-1935    [name] returns today for further management of her coronary artery  disease.  She has lost 19 pounds and plans to lose more.  She is beginning  aerobics next week.   She has had previous inferior wall infarct as outlined in the July 05, 2005 note.  This is 2004.  She has had stenting of the right coronary  artery.  Last catheterization April 2006 shows non-obstructive disease.  EF  was 49% by Myoview in January of 2007 with no ischemia or scar.   Her sugar has been way out of control and is running in the 200s.  She has  an appointment with Dr. Burnice Logan in the next couple of weeks.   She looks remarkably good today.   MEDICATIONS:  Unchanged since last visit.  Please refer to the maintenance  medication list.   Her blood pressure is 140/72, pulse 74 and regular, weight is 393.  Neck  shows no JVD.  Carotids are full without bruits.  Lungs are clear.  Heart  reveals a soft S1, S2.  Abdominal examination is obese, good bowel sounds.  Extremities reveal no edema.  Pulses were present.   [name] seems to be stable.  She has had some episodes of shortness of  breath with sweatiness that are sort of spontaneous.  I am not sure what  these are.  I have asked her to continue with her weight loss, her TLC, and  follow up with Dr. Burnice Logan.  We will plan on seeing her back in six  months.                               Thomas C. [name], MD, [facility]    TCW/MedQ  DD:  12/20/2005  DT:  12/20/2005  Job #:  [id number]   cc:   Marletta Lor, MD

## 2010-09-23 NOTE — Discharge Summary (Signed)
NAME:  [name], [name]                         ACCOUNT NO.:  [id number]   MEDICAL RECORD NO.:  [id number]                   PATIENT TYPE:  INP   LOCATION:  2025                                 FACILITY:  [facility]   PHYSICIAN:  Gwendolyn Grant, M.D. [facility]          DATE OF BIRTH:  [birth year]   DATE OF ADMISSION:  02/24/2002  DATE OF DISCHARGE:  02/27/2002                                 DISCHARGE SUMMARY   DISCHARGE DIAGNOSES:  1. Chest pain.  2. Hyperglycemia with diabetes.  3. Hyperkalemia.  4. Anemia.   BRIEF ADMISSION HISTORY:  Ms. Gilliand is a 76 year old African-American female  who presented with intermittent chest pressure associated with sharp chest  pain and left arm pain, shortness of breath, and headache for the past two  weeks. She denied any associated nausea and vomiting or diaphoresis. She  notes that over the past two weeks she has had a dramatic increase in  nitroglycerin use and notes that the chest pressure is responsive to  nitroglycerin therapy. The patient has had intense chest pain associated  with left arm pain and shortness of breath.   CARDIAC RISK FACTORS:  Age, morbid obesity, hypertension, diabetes, family  history.   PAST MEDICAL HISTORY:  1. Osteoarthritis.  2. Adult-onset diabetes mellitus.  3. Hypertension.  4. Morbid obesity.  5. Depression.  6. Status post appendectomy.  7. Status post C-section.  8. History of intestinal bypass with subsequent reversal.   HOSPITAL COURSE:  1. Cardiovascular. The patient presented with chest pain. The patient has     cardiac risk factors. The patient ruled out for MI. Cardiology was asked     to see the patient and recommended cardiac catheterization. Cardiac     catheterization on 02/26/02 revealed 50 to 60% mid portion of the RCA     stenosis with 30% proximal RCA stenosis. LAD was normal. Left main was     normal. Circumflex revealed 30% stenosis. Dr. Doreatha Lew recommended medical     management.  2.  Hyperkalemia. This was secondary to hemolysis and has corrected.   DISCHARGE LABORATORY DATA:  RBC folate was 238, hemoglobin 10.9, hematocrit  33.2. Coags were normal. CMET was normal except for elevated glucose. Total  iron was low at 36, TIBC was low at 240, percent saturation was 15, B12 65,  ferritin 89.   DISCHARGE MEDICATIONS:  1. Imdur 30 mg q.d.  2. Norvasc 2.5 mg q.d.  3. Lantus 50 units at bedtime.  4. Paxil 20 mg q.d.   FOLLOW UP:  The patient is to followup with Dr. Burnice Logan in two to three  weeks and Dr. Doreatha Lew as instructed.     Helayne Seminole, PA [facility]                    Gwendolyn Grant, M.D. [facility]    LC/MEDQ  D:  02/27/2002  T:  02/27/2002  Job:  [id number]  cc:   Ludwig Lean. Doreatha Lew, M.D.  [id number], [place] [address]  Fax: [phone]   Marletta Lor, M.D. [facility]

## 2010-09-23 NOTE — H&P (Signed)
[place]. [facility]  Patient:    [name], [name]                      MRN: [id number] Adm. Date:  [id number] Attending:  Rosezetta Schlatter CC:         Ludwig Lean. Doreatha Lew, M.D.  Marletta Lor, M.D., [facility], [place], [place]   History and Physical  CHIEF COMPLAINT: Weak and fever.  HISTORY OF PRESENT ILLNESS: Ms. Bolle is a 76 year old married black female with a history of multiple UTIs over the past year in the setting of being a diabetic.  She was in her usual state of health until approximately ten day prior to admission when she noted dark malodorous urine along with frequency and dysuria.  She has recently been seen by Dr. Harrington Challenger of the GYN service and was started on Evista for heavy menstrual discharge.  The patient continued feeling ill until the day of admission when she had sudden onset feeling very sick and having very hard shaking chills.  On presentation to the emergency department she had a fever of 102 degrees.  Her evaluation revealed a positive urinalysis, leukocytosis.  She is now admitted with pyelonephritis with sepsis for IV antibiotics.  PAST SURGICAL HISTORY:  1. Appendectomy, remote.  2. Cesarean section.  3. Intestinal bypass in 1977 with reversal in 1979.  PAST MEDICAL HISTORY:  1. Usual childhood diseases.  2. History of diabetes.  3. Severe osteoarthritis effecting her back and her knees.  4. History of insulin-dependent diabetes.  4. History of coronary artery disease.  5. History of morbid obesity.  PHYSICIAN ROSTER:  1. Cardiology, Ludwig Lean. Doreatha Lew, M.D.  2. Primary care, Marletta Lor, M.D.  ADMISSION MEDICATIONS:  1. Humulin N 34 units q.a.m., 20 units q.p.m.  2. Humulin R 6 units q.a.m., 6 units q.p.m.  3. Celebrex 200 mg b.i.d.  4. Glucophage 500 mg b.i.d.  5. Imdur 20 mg q.d.  6. Unspecified hypertensive medication q.d.  7. Paxil 20 mg q.d.  8. Evista 60 mg  q.d.  ALLERGIES:  1. SULFA.  2. CODEINE.  SOCIAL HISTORY: Tobacco, none.  Alcohol, none.  The patient has been married for 57 years and has two daughters and one son.  She has seven grandchildren. 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  She is limited in her activities by her back pain.  She as a supportive husband and family.  FAMILY HISTORY: Mother died of breast cancer.  Paternal aunt with breast cancer.  No family history of colon cancer.  Positive history for diabetes in father, sister, and paternal grandmother.  MIs in maternal kinship.  REVIEW OF SYSTEMS: HEENT: The patient is followed by ophthalmology for diabetic retinopathy and cataracts.  The patient has an upper denture but no other oral lesions.  RESPIRATORY: No pulmonary disease is noted. Cardiovascular history is as noted.  GI: Significant for chronic constipation but no history of GI bleeds or problems.  MUSCULOSKELETAL: Per HPI.  HEALTH MAINTENANCE: The patient has had a mammogram and Pap smear in January 2002.  PHYSICAL EXAMINATION:  VITAL SIGNS: The patients initial temperature was 102 degrees and was down to 100.6 degrees at the time of examination.  Blood pressure 122/105 on admission, down to 93/43, down to 83/palpable.  Heart rate 111.  Respiratory rate 24.  GENERAL: This is a morbidly obese black female, in no acute distress but uncomfortable.  HEENT: Head normocephalic, atraumatic.  TMs normal.  The patient has an upper denture.  No oral lesions noted.  Conjunctivae and sclerae clear.  PERRLA. Question of haziness to both lenses.  NECK: Supple.  NODES: The patient had no adenopathy of the cervical or supraclavicular regions.  CHEST: The patient has good breath sounds.  There were no rales, no wheezes noted.  No increased work of breathing was noted.  BREAST: Examination deferred.  CARDIOVASCULAR: There were 2+ radial and dorsalis pedis pulses.  Her precordium was quite.  Heart sounds were  distant but regular without murmurs, rubs, or gallops.  ABDOMEN: Morbidly obese.  She had tenderness to deep palpation in the left lower quadrant but otherwise unremarkable examination, although her size hinders the quality of examination.  PELVIC: Examination deferred, with normal pelvic examination and Pap smear within the last month.  EXTREMITIES: The patient has morbid obesity.  She as no deformity.  She has good motion about her shoulders, elbows, wrists, knees, and ankles without lesions.  NEUROLOGIC: The patient is awake and alert and oriented x 4.  Cranial nerves 2-12 are grossly intact.  LABORATORY DATA: An i-STAT revealed a sodium of 134, potassium 4.2, chloride 97, CO2 37.8, BUN 17, creatinine 1.4, glucose 285.  Hemoglobin was 12.  A full CBC shows a hemoglobin of 11.9, hematocrit 36.2, WBC 26,900 with 94% segs, 3% lymphocytes, 2% monocytes, 1% eosinophils; platelet count 319,000.  Urinalysis was cloudy with wbcs TNTC and many bacteria.  A 12 lead electrocardiogram revealed sinus tachycardia, no acute injury noted; question of old lateral infarct.  Chest x-ray with no active disease.  ASSESSMENT/PLAN:  1. Infectious disease.  The patient is a diabetic who presents with a ten day     history of probable urinary tract infection, now with rigors, fever,     hypotension, leukocytosis, all consistent with pyelonephritis with sepsis.     The plan is to obtain blood culture of two sites, draw in the emergency     room urine culture.  Give Cipro 400 mg IV q.12h.  Will dose the patient     with gentamicin until culture and sensitivities are returned and the     patient is improved.  The patient will be given IV of 150 cc an hour for     pressure support.  If she does not respond to a systolic of greater than     90 will need to consider low-dose dopamine for pressure support.  2. Diabetes.  The patient had been on Glucophage as an outpatient, which      needs to be  discontinued in the face of infection.  She does have     significant hyperglycemia at this time but not dangerously high.  The plan     is to hold Glucophage and will follow with sliding-scale insulin coverage.  3. Cardiovascular.  Patient with history of coronary artery disease, followed     by Dr. Doreatha Lew.  She has no cardiac symptoms at examination and her     electrocardiogram appears normal.  The plan will be to continue home     medications.  In summary, this patient is a morbidly obese diabetic and hypertensive patient who presents with urosepsis and pyelonephritis. DD:  05/29/00 TD:  05/29/00 Job: [id number]

## 2010-09-23 NOTE — Assessment & Plan Note (Signed)
[facility] OFFICE NOTE   HENESIS, MULLINS                      MRN:          [id number]  DATE:05/11/2006                            DOB:          December 22, 1934    The patient is a 76 year old female with a history of coronary artery  disease.  For the past week she has had some dyspnea on exertion.  She  also has had some mild GI distress.  Blood sugars have been fine.  Other  complaint is increase in ankle and feet edema.   EXAMINATION:  Revealed her to be wheelchair bound.  O2 saturation was 97-  98%, pulse 66, blood pressure 130/80.  There is no neck vein distention.  CHEST:  Clear.  CARDIOVASCULAR:  Rhythm was regular, no gallop.  EXTREMITIES:  Did reveal 1-2+ edema.   IMPRESSION:  Dyspnea on exertion, peripheral edema, history of coronary  artery disease.   DISPOSITION:  Will check a BNP as well as a hemoglobin A1c.  Will place  on Lasix 40 mg daily.  Anti-inflammatory drugs and Evista will be  discontinued.  Will recheck in 4 weeks.     Marletta Lor, MD  Electronically Signed    PFK/MedQ  DD: 05/11/2006  DT: 05/11/2006  Job #: [id number]

## 2010-09-23 NOTE — Discharge Summary (Signed)
[name], [name]               ACCOUNT NO.:  [id number]   MEDICAL RECORD NO.:  [id number]          PATIENT TYPE:  INP   LOCATION:  [id number]                         FACILITY:  [facility]   PHYSICIAN:  Marletta Lor, M.D. LHCDATE OF BIRTH:  14-Apr-1935   DATE OF ADMISSION:  08/16/2004  DATE OF DISCHARGE:  08/20/2004                                 DISCHARGE SUMMARY   FINAL DIAGNOSES:  1.  Chest pain.  2.  Coronary artery disease.  3.  Type 2 diabetes.  4.  Hypertension.  5.  Obesity.  6.  Osteoarthritis.  7.  Depression.   HISTORY OF PRESENT ILLNESS:  The patient is a 76 year old black female who  presented with a several-day history of chest pressure mainly in the left  mid chest area with radiation to the left neck.  The pain was intermittent  and relieved by nitroglycerin.  There as no associated diaphoresis or nausea  with some increase in shortness of breath.  The patient was subsequently  admitted for further evaluation and treatment of her chest pain.   PAST MEDICAL HISTORY:  Pertinent for:  1.  Long history of type 2 diabetes, insulin requiring.  2.  History of coronary artery disease, prior MI and stent placement      involving the right coronary artery in 2004.   HOSPITAL COURSE:  The patient was seen in consultation by cardiology, who  performed a heart catheterization on August 18, 2004.  There was an  approximate 40% calcified lesion involving the mid right coronary artery  that was of questionable hemodynamic significance.  In the hospital, the  patient was treated with IV nitrates and stabilized.  Serial cardiac enzymes  were obtained and were negative.  A nuclear medicine perfusion lung scan was  negative for pulmonary embolism.  During the hospital period, blood sugars  were monitored and her insulin dose adjusted.  Laboratory studies revealed  moderate anemia with a hemoglobin of 10.8 and hematocrit 29.8.  MCV was  normal at 84.  BUN and creatinine were  normal.  H&H on admission 10.9 and  32%.  [name] heparin and IV nitrates were tapered and discontinued  and the patient remained clinically well.   DISPOSITION:  The patient was discharged today with close outpatient  followup.  In addition to her usual preadmission medications, she was placed  on Imdur 30 mg daily.  There have been concerns raised about possible  obstructive sleep apnea and a sleep study will be checked as an outpatient.  The patient will also be considered for reevaluation for repeat bariatric  surgery.   DISCHARGE MEDICATIONS:  1.  Lantus 50 units at bedtime.  2.  Aspirin 325 mg daily.  3.  Lipitor 40 mg daily.  4.  Paxil 25 mg daily.  5.  Wellbutrin XL 150 mg daily.  6.  Metoprolol 50 mg b.i.d.  7.  Avapro 150 mg daily.  8.  Metformin 1 g b.i.d.  9.  Amaryl 4 mg daily.  10. Darvocet-N 100 p.r.n. pain.   CONDITION ON DISCHARGE:  Stable.  PFK/MEDQ  D:  08/20/2004  T:  08/20/2004  Job:  [id number]

## 2010-09-23 NOTE — Op Note (Signed)
NAME:  [name], [name]                         ACCOUNT NO.:  [id number]   MEDICAL RECORD NO.:  [id number]                   PATIENT TYPE:  INP   LOCATION:  6529                                 FACILITY:  [facility]   PHYSICIAN:  Ludwig Lean. Doreatha Lew, M.D.            DATE OF BIRTH:  1934-06-25   DATE OF PROCEDURE:  02/25/2002  DATE OF DISCHARGE:                                 OPERATIVE REPORT   HISTORY:  The patient is a 76 year old black female known to [place] from prior  evaluation of chest pain.  She was morbidly obese at that time which limited  evaluation.  She has been admitted because of left arm and shoulder  discomfort described somewhat as needle-like in association with chest  pressure.  She has had left anterior chest swelling.  She has had shortness  of breath with exertion but really not exertional chest pain.   PAST MEDICAL HISTORY:  Chest pain, morbid obesity, depression, insulin  dependent diabetes, hypertension, osteoarthritis, appendectomy, cesarean  section, history of intestinal bypass with subsequent reversal in the late  70s.   ALLERGIES:  SULFA AND CODEINE.   CURRENT MEDICATIONS:  1. IMDUR 30 mg a day.  2. Norvasc 2.5 mg a day.  3. Lantus insulin.  4. Paxil.   FAMILY HISTORY:  Father lived and age 59 with arthritis.  Mother died of  breast cancer.   SOCIAL HISTORY:  She is married.  No tobacco.  No alcohol.   REVIEW OF SYSTEMS:  She is not having any lower extremity edema.  There was  no nausea, vomiting or diaphoresis.  She has had no neurological deficits  noted.   PHYSICAL EXAMINATION:  GENERAL:  She is morbidly obese (weight approximately  400 pounds).  Blood pressure 140/80, heart rate 90s, respiratory rate 20.  SKIN:  Warm and dry.  LUNGS:  Reasonably clear.  HEART:  Regular rate and rhythm.  ABDOMEN:  Obese.  EXTREMITIES:  Without edema.   LABORATORY DATA:  Hematocrit was 35.  Potassium 6.1, glucose 229, total  bilirubin 2.1.  CP and troponin  negative.  EKG is non acute.  There are very  minor ST changes, basically normal.  Chest x-ray pending.   IMPRESSION:  1. Chest pain, possible coronary disease.  2. Morbid obesity.  3. History of hypertension.  4. Insulin dependent diabetes mellitus.  5. Depression.   PLAN:  Will try to proceed on with elective cardiac catheterization through  the right brachial approach.  The procedure, risks and benefits have been  explained.  The risks, including heart attack, stroke, __________ emboli as  well as peripheral artery problems have been explained and discussed.  Ludwig Lean. Doreatha Lew, M.D.    SNT/MEDQ  D:  02/25/2002  T:  02/25/2002  Job:  [id number]   cc:   Darrick Penna. Swords, M.D. [facility]

## 2010-09-26 ENCOUNTER — Other Ambulatory Visit: Payer: Self-pay | Admitting: Family Medicine

## 2010-09-26 ENCOUNTER — Encounter (HOSPITAL_COMMUNITY): Payer: Medicare (Managed Care)

## 2010-09-26 LAB — GLUCOSE, CAPILLARY
Glucose-Capillary: 215 mg/dL — ABNORMAL HIGH (ref 70–99)
Glucose-Capillary: 67 mg/dL — ABNORMAL LOW (ref 70–99)
Glucose-Capillary: 67 mg/dL — ABNORMAL LOW (ref 70–99)
Glucose-Capillary: 73 mg/dL (ref 70–99)
Glucose-Capillary: 94 mg/dL (ref 70–99)

## 2010-09-28 ENCOUNTER — Encounter (HOSPITAL_COMMUNITY): Payer: Medicare (Managed Care)

## 2010-09-28 ENCOUNTER — Ambulatory Visit: Payer: Medicare Other | Admitting: Physician Assistant

## 2010-09-28 ENCOUNTER — Other Ambulatory Visit: Payer: Self-pay | Admitting: Family Medicine

## 2010-09-28 LAB — GLUCOSE, CAPILLARY
Glucose-Capillary: 185 mg/dL — ABNORMAL HIGH (ref 70–99)
Glucose-Capillary: 175 mg/dL — ABNORMAL HIGH (ref 70–99)

## 2010-09-30 ENCOUNTER — Encounter (HOSPITAL_COMMUNITY): Payer: Medicare (Managed Care)

## 2010-09-30 ENCOUNTER — Other Ambulatory Visit: Payer: Self-pay | Admitting: Family Medicine

## 2010-09-30 LAB — GLUCOSE, CAPILLARY
Glucose-Capillary: 121 mg/dL — ABNORMAL HIGH (ref 70–99)
Glucose-Capillary: 105 mg/dL — ABNORMAL HIGH (ref 70–99)

## 2010-10-03 ENCOUNTER — Encounter (HOSPITAL_COMMUNITY): Payer: Medicare (Managed Care)

## 2010-10-05 ENCOUNTER — Other Ambulatory Visit: Payer: Self-pay | Admitting: Family Medicine

## 2010-10-05 ENCOUNTER — Encounter (HOSPITAL_COMMUNITY): Payer: Medicare (Managed Care)

## 2010-10-05 LAB — GLUCOSE, CAPILLARY
Glucose-Capillary: 265 mg/dL — ABNORMAL HIGH (ref 70–99)
Glucose-Capillary: 290 mg/dL — ABNORMAL HIGH (ref 70–99)

## 2010-10-06 ENCOUNTER — Ambulatory Visit: Payer: Medicare Other | Admitting: Physician Assistant

## 2010-10-07 ENCOUNTER — Encounter (HOSPITAL_COMMUNITY): Payer: Medicare (Managed Care)

## 2010-10-10 ENCOUNTER — Other Ambulatory Visit: Payer: Self-pay | Admitting: Family Medicine

## 2010-10-10 ENCOUNTER — Encounter (HOSPITAL_COMMUNITY): Payer: Medicare (Managed Care) | Attending: Family Medicine

## 2010-10-10 DIAGNOSIS — R5381 Other malaise: Secondary | ICD-10-CM | POA: Insufficient documentation

## 2010-10-10 DIAGNOSIS — Z9884 Bariatric surgery status: Secondary | ICD-10-CM | POA: Insufficient documentation

## 2010-10-10 DIAGNOSIS — J4489 Other specified chronic obstructive pulmonary disease: Secondary | ICD-10-CM | POA: Insufficient documentation

## 2010-10-10 DIAGNOSIS — I252 Old myocardial infarction: Secondary | ICD-10-CM | POA: Insufficient documentation

## 2010-10-10 DIAGNOSIS — G4733 Obstructive sleep apnea (adult) (pediatric): Secondary | ICD-10-CM | POA: Insufficient documentation

## 2010-10-10 DIAGNOSIS — F341 Dysthymic disorder: Secondary | ICD-10-CM | POA: Insufficient documentation

## 2010-10-10 DIAGNOSIS — Z5189 Encounter for other specified aftercare: Secondary | ICD-10-CM | POA: Insufficient documentation

## 2010-10-10 DIAGNOSIS — I10 Essential (primary) hypertension: Secondary | ICD-10-CM | POA: Insufficient documentation

## 2010-10-10 DIAGNOSIS — E119 Type 2 diabetes mellitus without complications: Secondary | ICD-10-CM | POA: Insufficient documentation

## 2010-10-10 DIAGNOSIS — IMO0002 Reserved for concepts with insufficient information to code with codable children: Secondary | ICD-10-CM | POA: Insufficient documentation

## 2010-10-10 DIAGNOSIS — I2510 Atherosclerotic heart disease of native coronary artery without angina pectoris: Secondary | ICD-10-CM | POA: Insufficient documentation

## 2010-10-10 DIAGNOSIS — Z9861 Coronary angioplasty status: Secondary | ICD-10-CM | POA: Insufficient documentation

## 2010-10-10 DIAGNOSIS — F429 Obsessive-compulsive disorder, unspecified: Secondary | ICD-10-CM | POA: Insufficient documentation

## 2010-10-10 DIAGNOSIS — E785 Hyperlipidemia, unspecified: Secondary | ICD-10-CM | POA: Insufficient documentation

## 2010-10-10 DIAGNOSIS — J449 Chronic obstructive pulmonary disease, unspecified: Secondary | ICD-10-CM | POA: Insufficient documentation

## 2010-10-10 DIAGNOSIS — I200 Unstable angina: Secondary | ICD-10-CM | POA: Insufficient documentation

## 2010-10-10 DIAGNOSIS — Z794 Long term (current) use of insulin: Secondary | ICD-10-CM | POA: Insufficient documentation

## 2010-10-10 DIAGNOSIS — Z7902 Long term (current) use of antithrombotics/antiplatelets: Secondary | ICD-10-CM | POA: Insufficient documentation

## 2010-10-10 DIAGNOSIS — Z79899 Other long term (current) drug therapy: Secondary | ICD-10-CM | POA: Insufficient documentation

## 2010-10-10 DIAGNOSIS — Z7982 Long term (current) use of aspirin: Secondary | ICD-10-CM | POA: Insufficient documentation

## 2010-10-10 LAB — GLUCOSE, CAPILLARY
Glucose-Capillary: 211 mg/dL — ABNORMAL HIGH (ref 70–99)
Glucose-Capillary: 225 mg/dL — ABNORMAL HIGH (ref 70–99)

## 2010-10-12 ENCOUNTER — Ambulatory Visit (INDEPENDENT_AMBULATORY_CARE_PROVIDER_SITE_OTHER): Payer: PRIVATE HEALTH INSURANCE | Admitting: Physician Assistant

## 2010-10-12 ENCOUNTER — Encounter (HOSPITAL_COMMUNITY): Payer: Medicare (Managed Care)

## 2010-10-12 ENCOUNTER — Encounter: Payer: Self-pay | Admitting: Physician Assistant

## 2010-10-12 VITALS — BP 145/73 | HR 107 | Ht 69.0 in | Wt 383.0 lb

## 2010-10-12 DIAGNOSIS — R079 Chest pain, unspecified: Secondary | ICD-10-CM

## 2010-10-12 DIAGNOSIS — I2510 Atherosclerotic heart disease of native coronary artery without angina pectoris: Secondary | ICD-10-CM

## 2010-10-12 DIAGNOSIS — I10 Essential (primary) hypertension: Secondary | ICD-10-CM

## 2010-10-12 DIAGNOSIS — R0600 Dyspnea, unspecified: Secondary | ICD-10-CM | POA: Insufficient documentation

## 2010-10-12 DIAGNOSIS — R0602 Shortness of breath: Secondary | ICD-10-CM

## 2010-10-12 LAB — BASIC METABOLIC PANEL
BUN: 19 mg/dL (ref 6–23)
CO2: 25 mEq/L (ref 19–32)
Calcium: 9.2 mg/dL (ref 8.4–10.5)
Chloride: 99 mEq/L (ref 96–112)
Creatinine, Ser: 1.3 mg/dL — ABNORMAL HIGH (ref 0.4–1.2)
GFR: 51.96 mL/min — ABNORMAL LOW (ref >60.00)
Glucose, Bld: 278 mg/dL — ABNORMAL HIGH (ref 70–99)
Potassium: 5.7 mEq/L — ABNORMAL HIGH (ref 3.5–5.1)
Sodium: 135 mEq/L (ref 135–145)

## 2010-10-12 NOTE — Patient Instructions (Signed)
Your physician recommends that you schedule a follow-up appointment in: [place] DR. WALL AS PER SCOTT WEAVER, PA-C  Your physician has requested that you have an echocardiogram 786.05. Echocardiography is a painless test that uses sound waves to create images of your heart. It provides your doctor with information about the size and shape of your heart and how well your heart's chambers and valves are working. This procedure takes approximately one hour. There are no restrictions for this procedure.  Your physician recommends that you return for lab work in: TODAY BMET/BNP 786.05  You have been referred to [place] 786.05

## 2010-10-12 NOTE — Assessment & Plan Note (Signed)
Elevated today, but has been optimal in the past.  Monitor for now.

## 2010-10-12 NOTE — Assessment & Plan Note (Addendum)
She has shortness of breath with exertion.  I had a long discussion with her today regarding her symptoms.  It sounds as though she started to note shortness of breath when she started to increase her activity after her stent was placed.  I revisited her symptoms that she had prior to her PCI.  She denies a recurrence of these.  She denies a recurrence of chest pain.  She does not appear to be volume overloaded.  She certainly would be at risk for diastolic dysfunction.  I do not see that she has had an echocardiogram.  I will set her up for a 2-D echocardiogram as well as a basic metabolic panel and a BNP.  If her BNP is significantly elevated, I will initiate diuresis.  She does report some wheezing.  I see a diagnosis of COPD in her problem list.  She denies this and she denies a history of smoking.  She did have pleural parenchymal scarring on a Chest CT in 2010.  She was prescribed ProAir by her prior PCP in the past.  I do not hear any wheezing on exam today.  I suspect she probably has obesity hypoventilation syndrome contributing, at least in part, to her dyspnea.  She had no evidence of pulmonary embolism on CT in the past with elevated D-dimers.  She would be at risk for sleep apnea, but notes a negative workup in the past.  I think she would benefit from seeing pulmonology for further evaluation.  I will make that referral.

## 2010-10-12 NOTE — Assessment & Plan Note (Signed)
Resolved.  Suspect this was more related to GERD.  Continue current therapy.

## 2010-10-12 NOTE — Progress Notes (Signed)
History of Present Illness: Primary Cardiologist:  Dr. Jenell Milliner  [name] is a 76 y.o. female with a history of CAD, status post bare-metal stent to the RCA in 2004 and drug-eluting stent placement to the RCA in 06/2010 in the setting of unstable angina with overall preserved LV function.  She presented to the emergency room 5/3 from cardiac rehabilitation secondary to chest pain.  She was apparently given nitroglycerin with relief.  She had cardiac enzymes negative x1.  I saw her in follow up on 5/10.  I had a long discussion with her regarding whether or not to proceed with further testing.  She was not in favor of this.  I adjusted medical therapy by increasing her isosorbide to 60 mg and her Protonix 40 mg twice a day.  She returns today for follow up.  She denies any further chest discomfort.  She is concerned because she continues to get short of breath with exertion.  I had a long discussion with her today regarding this.  Prior to her PCI, she was not that active.  She was riding around in a wheelchair most of the time.  She states that since her PCI, she has increased her activity and is using a walker.  Since then, she has noticed significant dyspnea with exertion.  She sleeps on 3 pillows.  This is chronic without change.  She denies significant weight gain.  She denies PND or pedal edema.  She feels hoarse and wheezes when she gets short of breath.  She denies syncope.  She has gotten lightheaded with shortness of breath in the past.  There is a reported history of snoring.  Apparently she was tested for sleep apnea in the past.  Past Medical History  Diagnosis Date  . COPD (chronic obstructive pulmonary disease)     patient denies this  . Diabetes mellitus   . Diverticular disease   . Hypertension   . Hyperlipidemia   . Coronary artery disease     a. s/p IMI 2004 tx with BMS to RCA;  b. s/p Promus DES to RCA 2/12 (cath: LM 20-30%, pLAD 20-30%, mLAD 50%, RI 30%, pRCA 60%, mRCA 90%  - tx with PCI);  c. myoview 1/07: EF 49%, inf scar, no isch  . Urinary incontinence   . Insomnia   . Osteoarthritis   . Morbid obesity   . Obstructive sleep apnea   . Polymyalgia rheumatica   . Anxiety   . Arthritis   . Depression   . Cholelithiasis     Current Outpatient Prescriptions  Medication Sig Dispense Refill  . Albuterol Sulfate (VENTOLIN HFA IN) Inhale into the lungs as directed.        [name] aspirin 81 MG EC tablet Take 81 mg by mouth daily.        [name] buPROPion (WELLBUTRIN SR) 150 MG 12 hr tablet Take 150 mg by mouth 2 (two) times daily.        . citalopram (CELEXA) 20 MG tablet Take 20 mg by mouth daily.        . clonazePAM (KLONOPIN) 1 MG tablet Take 1 mg by mouth at bedtime.        . docusate sodium (COLACE) 100 MG capsule Take 100 mg by mouth 2 (two) times daily as needed.       [name] FLUoxetine (PROZAC) 40 MG capsule Take 40 mg by mouth daily.        [name] glimepiride (AMARYL) 4 MG tablet Take 4  mg by mouth daily before breakfast.        . HYDROcodone-acetaminophen (VICODIN) 5-500 MG per tablet Take 1 tablet by mouth every 6 (six) hours as needed.        . Insulin Aspart (NOVOLOG FLEXPEN [place]) Inject into the skin as directed.        . insulin glargine (LANTUS) 100 UNIT/ML injection Inject into the skin as directed.        . isosorbide mononitrate (IMDUR) 30 MG 24 hr tablet Take 2 tablets (60 mg total) by mouth daily.  60 tablet  11  . metFORMIN (GLUCOPHAGE) 850 MG tablet Take 850 mg by mouth 3 (three) times daily.        . metoprolol (TOPROL-XL) 50 MG 24 hr tablet 1/2 tab po bid      . nitroGLYCERIN (NITROSTAT) 0.4 MG SL tablet Place 0.4 mg under the tongue every 5 (five) minutes as needed.        . pantoprazole (PROTONIX) 40 MG tablet Take 1 tablet (40 mg total) by mouth 2 (two) times daily.  60 tablet  11  . prasugrel (EFFIENT) 10 MG TABS Take 10 mg by mouth daily.        . pravastatin (PRAVACHOL) 20 MG tablet Take 20 mg by mouth daily.        . predniSONE (DELTASONE) 5 MG tablet  Take 10 mg by mouth daily.        . pseudoephedrine (SUDAFED) 30 MG tablet Take 30 mg by mouth every 4 (four) hours as needed.        . triamcinolone (KENALOG) 0.5 % ointment Apply topically 2 (two) times daily.        [name] DISCONTD: ALPRAZolam (XANAX) 0.25 MG tablet Take 0.25 mg by mouth 2 (two) times daily.        [name] DISCONTD: zolpidem (AMBIEN) 10 MG tablet Take 5 mg by mouth at bedtime as needed.        [name] DISCONTD: ferrous sulfate 325 (65 FE) MG tablet Take 325 mg by mouth daily with breakfast.        . DISCONTD: meclizine (ANTIVERT) 25 MG tablet Take 25 mg by mouth as needed.        [name] DISCONTD: tolnaftate (TINACTIN) 1 % powder Apply topically as directed.          Allergies  Allergen Reactions  . Codeine   . Sulfonamide Derivatives     History  Substance Use Topics  . Smoking status: Never Smoker   . Smokeless tobacco: Not on file  . Alcohol Use: No    Vital Signs: BP 145/73  Pulse 107  Ht 5\' 9"  (1.753 m)  Wt 383 lb (173.728 kg)  BMI 56.56 kg/m2  SpO2 97%  PHYSICAL EXAM: Well nourished, well developed, in no acute distress HEENT: normal Neck: no JVD At 90 Cardiac:  normal S1, S2; RRR; no murmur, distant heart sounds Lungs:  clear to auscultation bilaterally, no wheezing, rhonchi or rales Abd: soft, nontender Ext: Trace bilateral edema Skin: warm and dry Neuro:  CNs 2-12 intact, no focal abnormalities noted  EKG:  Sinus rhythm, heart rate 107, normal axis, possible old inferior infarct, age undetermined, nonspecific ST-T wave changes, no significant changes when compared to prior tracings  ASSESSMENT AND PLAN:

## 2010-10-12 NOTE — Assessment & Plan Note (Signed)
Continue ASA and Effient.  Follow up with Dr. [name] in 6 weeks.

## 2010-10-13 LAB — BRAIN NATRIURETIC PEPTIDE: Pro B Natriuretic peptide (BNP): 15 pg/mL (ref 0.0–100.0)

## 2010-10-14 ENCOUNTER — Other Ambulatory Visit: Payer: Self-pay | Admitting: Family Medicine

## 2010-10-14 ENCOUNTER — Encounter (HOSPITAL_COMMUNITY): Payer: Medicare (Managed Care)

## 2010-10-14 LAB — GLUCOSE, CAPILLARY
Glucose-Capillary: 261 mg/dL — ABNORMAL HIGH (ref 70–99)
Glucose-Capillary: 284 mg/dL — ABNORMAL HIGH (ref 70–99)

## 2010-10-17 ENCOUNTER — Other Ambulatory Visit: Payer: Self-pay | Admitting: Family Medicine

## 2010-10-17 ENCOUNTER — Encounter (HOSPITAL_COMMUNITY): Payer: Medicare (Managed Care)

## 2010-10-17 LAB — GLUCOSE, CAPILLARY: Glucose-Capillary: 177 mg/dL — ABNORMAL HIGH (ref 70–99)

## 2010-10-18 ENCOUNTER — Telehealth: Payer: Self-pay | Admitting: *Deleted

## 2010-10-18 DIAGNOSIS — I10 Essential (primary) hypertension: Secondary | ICD-10-CM

## 2010-10-18 NOTE — Telephone Encounter (Signed)
See phone note

## 2010-10-19 ENCOUNTER — Other Ambulatory Visit: Payer: Self-pay | Admitting: Family Medicine

## 2010-10-19 ENCOUNTER — Encounter (HOSPITAL_COMMUNITY): Payer: Medicare (Managed Care)

## 2010-10-19 ENCOUNTER — Other Ambulatory Visit: Payer: PRIVATE HEALTH INSURANCE | Admitting: *Deleted

## 2010-10-19 LAB — GLUCOSE, CAPILLARY: Glucose-Capillary: 142 mg/dL — ABNORMAL HIGH (ref 70–99)

## 2010-10-20 ENCOUNTER — Other Ambulatory Visit: Payer: PRIVATE HEALTH INSURANCE | Admitting: *Deleted

## 2010-10-20 ENCOUNTER — Telehealth: Payer: Self-pay | Admitting: *Deleted

## 2010-10-20 DIAGNOSIS — I10 Essential (primary) hypertension: Secondary | ICD-10-CM

## 2010-10-20 NOTE — Telephone Encounter (Signed)
Pt did not come in today for her repeat labs. [name]

## 2010-10-20 NOTE — Telephone Encounter (Signed)
PT COMING IN TODAY FOR LABS SAID SHE FORGOT YESTERDAY

## 2010-10-21 ENCOUNTER — Encounter (HOSPITAL_COMMUNITY): Payer: Medicare (Managed Care)

## 2010-10-21 ENCOUNTER — Other Ambulatory Visit (INDEPENDENT_AMBULATORY_CARE_PROVIDER_SITE_OTHER): Payer: PRIVATE HEALTH INSURANCE | Admitting: *Deleted

## 2010-10-21 ENCOUNTER — Other Ambulatory Visit: Payer: PRIVATE HEALTH INSURANCE | Admitting: *Deleted

## 2010-10-21 DIAGNOSIS — I10 Essential (primary) hypertension: Secondary | ICD-10-CM

## 2010-10-22 LAB — BASIC METABOLIC PANEL WITH GFR
BUN: 18 mg/dL (ref 6–23)
CO2: 25 mEq/L (ref 19–32)
Calcium: 9.5 mg/dL (ref 8.4–10.5)
Chloride: 99 mEq/L (ref 96–112)
Creat: 1.33 mg/dL — ABNORMAL HIGH (ref 0.50–1.10)
GFR, Est African American: 47 mL/min — ABNORMAL LOW (ref >60)
GFR, Est Non African American: 39 mL/min — ABNORMAL LOW (ref >60)
Glucose, Bld: 187 mg/dL — ABNORMAL HIGH (ref 70–99)
Potassium: 5.9 mEq/L — ABNORMAL HIGH (ref 3.5–5.3)
Sodium: 137 mEq/L (ref 135–145)

## 2010-10-24 ENCOUNTER — Other Ambulatory Visit: Payer: Self-pay | Admitting: Family Medicine

## 2010-10-24 ENCOUNTER — Encounter (HOSPITAL_COMMUNITY): Payer: Medicare (Managed Care)

## 2010-10-24 LAB — GLUCOSE, CAPILLARY: Glucose-Capillary: 161 mg/dL — ABNORMAL HIGH (ref 70–99)

## 2010-10-25 ENCOUNTER — Ambulatory Visit (HOSPITAL_COMMUNITY): Payer: Medicare (Managed Care) | Attending: Cardiology | Admitting: Radiology

## 2010-10-25 DIAGNOSIS — I10 Essential (primary) hypertension: Secondary | ICD-10-CM | POA: Insufficient documentation

## 2010-10-25 DIAGNOSIS — R0602 Shortness of breath: Secondary | ICD-10-CM

## 2010-10-25 DIAGNOSIS — E785 Hyperlipidemia, unspecified: Secondary | ICD-10-CM | POA: Insufficient documentation

## 2010-10-25 DIAGNOSIS — R072 Precordial pain: Secondary | ICD-10-CM

## 2010-10-25 DIAGNOSIS — E119 Type 2 diabetes mellitus without complications: Secondary | ICD-10-CM | POA: Insufficient documentation

## 2010-10-26 ENCOUNTER — Other Ambulatory Visit: Payer: Self-pay | Admitting: Family Medicine

## 2010-10-26 ENCOUNTER — Encounter (HOSPITAL_COMMUNITY): Payer: Medicare (Managed Care)

## 2010-10-26 LAB — GLUCOSE, CAPILLARY: Glucose-Capillary: 274 mg/dL — ABNORMAL HIGH (ref 70–99)

## 2010-10-28 ENCOUNTER — Other Ambulatory Visit: Payer: Self-pay | Admitting: Family Medicine

## 2010-10-28 ENCOUNTER — Institutional Professional Consult (permissible substitution): Payer: Medicare Other | Admitting: Emergency Medicine

## 2010-10-28 ENCOUNTER — Encounter (HOSPITAL_COMMUNITY): Payer: Medicare (Managed Care)

## 2010-10-28 LAB — GLUCOSE, CAPILLARY: Glucose-Capillary: 200 mg/dL — ABNORMAL HIGH (ref 70–99)

## 2010-10-31 ENCOUNTER — Other Ambulatory Visit: Payer: Self-pay | Admitting: Family Medicine

## 2010-10-31 ENCOUNTER — Encounter (HOSPITAL_COMMUNITY): Payer: Medicare (Managed Care)

## 2010-10-31 LAB — GLUCOSE, CAPILLARY: Glucose-Capillary: 132 mg/dL — ABNORMAL HIGH (ref 70–99)

## 2010-11-01 ENCOUNTER — Institutional Professional Consult (permissible substitution): Payer: Medicare Other | Admitting: Emergency Medicine

## 2010-11-02 ENCOUNTER — Other Ambulatory Visit: Payer: Self-pay | Admitting: Family Medicine

## 2010-11-02 ENCOUNTER — Encounter (HOSPITAL_COMMUNITY): Payer: Medicare (Managed Care)

## 2010-11-03 LAB — GLUCOSE, CAPILLARY: Glucose-Capillary: 153 mg/dL — ABNORMAL HIGH (ref 70–99)

## 2010-11-04 ENCOUNTER — Telehealth: Payer: Self-pay | Admitting: *Deleted

## 2010-11-04 ENCOUNTER — Other Ambulatory Visit: Payer: Self-pay | Admitting: Family Medicine

## 2010-11-04 ENCOUNTER — Encounter (HOSPITAL_COMMUNITY): Payer: Medicare (Managed Care)

## 2010-11-04 DIAGNOSIS — I2510 Atherosclerotic heart disease of native coronary artery without angina pectoris: Secondary | ICD-10-CM

## 2010-11-04 NOTE — Telephone Encounter (Signed)
Detailed message left on personal voicemail re lab results and recommendations. Pt also to return next week for bmet.  I will place order and await pt call back to schedule. Horton Chin RN

## 2010-11-04 NOTE — Telephone Encounter (Signed)
Message copied by Odetta Pink on Fri Nov 04, 2010  3:45 PM ------      Message from: [place]: Tue Oct 25, 2010 11:04 AM       Why is her K high? Perhaps Type IV Renal Tubular Acidosis. Stop any OTC K and decrease K rich foods. Do not use No Salt substitute. Check BMET in 1 week.

## 2010-11-07 ENCOUNTER — Encounter (HOSPITAL_COMMUNITY): Payer: PRIVATE HEALTH INSURANCE | Attending: Family Medicine

## 2010-11-07 ENCOUNTER — Other Ambulatory Visit: Payer: Self-pay | Admitting: Family Medicine

## 2010-11-07 DIAGNOSIS — Z9861 Coronary angioplasty status: Secondary | ICD-10-CM | POA: Insufficient documentation

## 2010-11-07 DIAGNOSIS — E785 Hyperlipidemia, unspecified: Secondary | ICD-10-CM | POA: Insufficient documentation

## 2010-11-07 DIAGNOSIS — I10 Essential (primary) hypertension: Secondary | ICD-10-CM | POA: Insufficient documentation

## 2010-11-07 DIAGNOSIS — I200 Unstable angina: Secondary | ICD-10-CM | POA: Insufficient documentation

## 2010-11-07 DIAGNOSIS — F341 Dysthymic disorder: Secondary | ICD-10-CM | POA: Insufficient documentation

## 2010-11-07 DIAGNOSIS — Z5189 Encounter for other specified aftercare: Secondary | ICD-10-CM | POA: Insufficient documentation

## 2010-11-07 DIAGNOSIS — J449 Chronic obstructive pulmonary disease, unspecified: Secondary | ICD-10-CM | POA: Insufficient documentation

## 2010-11-07 DIAGNOSIS — J4489 Other specified chronic obstructive pulmonary disease: Secondary | ICD-10-CM | POA: Insufficient documentation

## 2010-11-07 DIAGNOSIS — Z79899 Other long term (current) drug therapy: Secondary | ICD-10-CM | POA: Insufficient documentation

## 2010-11-07 DIAGNOSIS — G4733 Obstructive sleep apnea (adult) (pediatric): Secondary | ICD-10-CM | POA: Insufficient documentation

## 2010-11-07 DIAGNOSIS — E119 Type 2 diabetes mellitus without complications: Secondary | ICD-10-CM | POA: Insufficient documentation

## 2010-11-07 DIAGNOSIS — Z7902 Long term (current) use of antithrombotics/antiplatelets: Secondary | ICD-10-CM | POA: Insufficient documentation

## 2010-11-07 DIAGNOSIS — Z9884 Bariatric surgery status: Secondary | ICD-10-CM | POA: Insufficient documentation

## 2010-11-07 DIAGNOSIS — F429 Obsessive-compulsive disorder, unspecified: Secondary | ICD-10-CM | POA: Insufficient documentation

## 2010-11-07 DIAGNOSIS — IMO0002 Reserved for concepts with insufficient information to code with codable children: Secondary | ICD-10-CM | POA: Insufficient documentation

## 2010-11-07 DIAGNOSIS — I2510 Atherosclerotic heart disease of native coronary artery without angina pectoris: Secondary | ICD-10-CM | POA: Insufficient documentation

## 2010-11-07 DIAGNOSIS — I252 Old myocardial infarction: Secondary | ICD-10-CM | POA: Insufficient documentation

## 2010-11-07 DIAGNOSIS — R5381 Other malaise: Secondary | ICD-10-CM | POA: Insufficient documentation

## 2010-11-07 DIAGNOSIS — Z7982 Long term (current) use of aspirin: Secondary | ICD-10-CM | POA: Insufficient documentation

## 2010-11-07 DIAGNOSIS — Z794 Long term (current) use of insulin: Secondary | ICD-10-CM | POA: Insufficient documentation

## 2010-11-07 LAB — GLUCOSE, CAPILLARY
Glucose-Capillary: 179 mg/dL — ABNORMAL HIGH (ref 70–99)
Glucose-Capillary: 174 mg/dL — ABNORMAL HIGH (ref 70–99)

## 2010-11-09 ENCOUNTER — Encounter (HOSPITAL_COMMUNITY): Payer: PRIVATE HEALTH INSURANCE

## 2010-11-11 ENCOUNTER — Encounter (HOSPITAL_COMMUNITY): Payer: PRIVATE HEALTH INSURANCE

## 2010-11-14 ENCOUNTER — Encounter (HOSPITAL_COMMUNITY): Payer: PRIVATE HEALTH INSURANCE

## 2010-11-14 ENCOUNTER — Other Ambulatory Visit: Payer: Self-pay | Admitting: Family Medicine

## 2010-11-14 LAB — GLUCOSE, CAPILLARY: Glucose-Capillary: 164 mg/dL — ABNORMAL HIGH (ref 70–99)

## 2010-11-16 ENCOUNTER — Encounter (HOSPITAL_COMMUNITY): Payer: PRIVATE HEALTH INSURANCE

## 2010-11-18 ENCOUNTER — Other Ambulatory Visit: Payer: Self-pay | Admitting: Family Medicine

## 2010-11-18 ENCOUNTER — Encounter (HOSPITAL_COMMUNITY): Payer: PRIVATE HEALTH INSURANCE

## 2010-11-21 ENCOUNTER — Encounter (HOSPITAL_COMMUNITY): Payer: PRIVATE HEALTH INSURANCE

## 2010-11-21 LAB — GLUCOSE, CAPILLARY: Glucose-Capillary: 154 mg/dL — ABNORMAL HIGH (ref 70–99)

## 2010-11-23 ENCOUNTER — Encounter (HOSPITAL_COMMUNITY): Payer: PRIVATE HEALTH INSURANCE

## 2010-11-25 ENCOUNTER — Encounter (HOSPITAL_COMMUNITY): Payer: PRIVATE HEALTH INSURANCE

## 2010-11-28 ENCOUNTER — Other Ambulatory Visit: Payer: Self-pay | Admitting: Family Medicine

## 2010-11-28 ENCOUNTER — Encounter (HOSPITAL_COMMUNITY): Payer: PRIVATE HEALTH INSURANCE

## 2010-11-28 ENCOUNTER — Ambulatory Visit
Admission: RE | Admit: 2010-11-28 | Discharge: 2010-11-28 | Disposition: A | Discharge status: Home / Self Care | Payer: No Typology Code available for payment source | Source: Ambulatory Visit | Attending: Family Medicine | Admitting: Family Medicine

## 2010-11-28 DIAGNOSIS — W19XXXA Unspecified fall, initial encounter: Secondary | ICD-10-CM

## 2010-11-30 ENCOUNTER — Encounter (HOSPITAL_COMMUNITY): Payer: PRIVATE HEALTH INSURANCE

## 2010-12-02 ENCOUNTER — Encounter (HOSPITAL_COMMUNITY): Payer: PRIVATE HEALTH INSURANCE

## 2010-12-05 ENCOUNTER — Encounter (HOSPITAL_COMMUNITY): Payer: PRIVATE HEALTH INSURANCE

## 2010-12-05 ENCOUNTER — Other Ambulatory Visit: Payer: Self-pay | Admitting: Family Medicine

## 2010-12-05 LAB — GLUCOSE, CAPILLARY: Glucose-Capillary: 169 mg/dL — ABNORMAL HIGH (ref 70–99)

## 2010-12-07 ENCOUNTER — Encounter (HOSPITAL_COMMUNITY): Payer: PRIVATE HEALTH INSURANCE | Attending: Family Medicine

## 2010-12-07 ENCOUNTER — Other Ambulatory Visit: Payer: Self-pay | Admitting: Family Medicine

## 2010-12-07 DIAGNOSIS — I252 Old myocardial infarction: Secondary | ICD-10-CM | POA: Insufficient documentation

## 2010-12-07 DIAGNOSIS — Z9884 Bariatric surgery status: Secondary | ICD-10-CM | POA: Insufficient documentation

## 2010-12-07 DIAGNOSIS — I2510 Atherosclerotic heart disease of native coronary artery without angina pectoris: Secondary | ICD-10-CM | POA: Insufficient documentation

## 2010-12-07 DIAGNOSIS — F429 Obsessive-compulsive disorder, unspecified: Secondary | ICD-10-CM | POA: Insufficient documentation

## 2010-12-07 DIAGNOSIS — E119 Type 2 diabetes mellitus without complications: Secondary | ICD-10-CM | POA: Insufficient documentation

## 2010-12-07 DIAGNOSIS — Z5189 Encounter for other specified aftercare: Secondary | ICD-10-CM | POA: Insufficient documentation

## 2010-12-07 DIAGNOSIS — J449 Chronic obstructive pulmonary disease, unspecified: Secondary | ICD-10-CM | POA: Insufficient documentation

## 2010-12-07 DIAGNOSIS — I200 Unstable angina: Secondary | ICD-10-CM | POA: Insufficient documentation

## 2010-12-07 DIAGNOSIS — J4489 Other specified chronic obstructive pulmonary disease: Secondary | ICD-10-CM | POA: Insufficient documentation

## 2010-12-07 DIAGNOSIS — G4733 Obstructive sleep apnea (adult) (pediatric): Secondary | ICD-10-CM | POA: Insufficient documentation

## 2010-12-07 DIAGNOSIS — E785 Hyperlipidemia, unspecified: Secondary | ICD-10-CM | POA: Insufficient documentation

## 2010-12-07 DIAGNOSIS — F341 Dysthymic disorder: Secondary | ICD-10-CM | POA: Insufficient documentation

## 2010-12-07 DIAGNOSIS — Z7982 Long term (current) use of aspirin: Secondary | ICD-10-CM | POA: Insufficient documentation

## 2010-12-07 DIAGNOSIS — Z9861 Coronary angioplasty status: Secondary | ICD-10-CM | POA: Insufficient documentation

## 2010-12-07 DIAGNOSIS — Z794 Long term (current) use of insulin: Secondary | ICD-10-CM | POA: Insufficient documentation

## 2010-12-07 DIAGNOSIS — R5381 Other malaise: Secondary | ICD-10-CM | POA: Insufficient documentation

## 2010-12-07 DIAGNOSIS — Z79899 Other long term (current) drug therapy: Secondary | ICD-10-CM | POA: Insufficient documentation

## 2010-12-07 DIAGNOSIS — Z7902 Long term (current) use of antithrombotics/antiplatelets: Secondary | ICD-10-CM | POA: Insufficient documentation

## 2010-12-07 DIAGNOSIS — I10 Essential (primary) hypertension: Secondary | ICD-10-CM | POA: Insufficient documentation

## 2010-12-07 DIAGNOSIS — IMO0002 Reserved for concepts with insufficient information to code with codable children: Secondary | ICD-10-CM | POA: Insufficient documentation

## 2010-12-07 LAB — GLUCOSE, CAPILLARY: Glucose-Capillary: 163 mg/dL — ABNORMAL HIGH (ref 70–99)

## 2010-12-09 ENCOUNTER — Encounter (HOSPITAL_COMMUNITY): Payer: PRIVATE HEALTH INSURANCE

## 2010-12-12 ENCOUNTER — Other Ambulatory Visit: Payer: Self-pay | Admitting: Family Medicine

## 2010-12-12 ENCOUNTER — Encounter (HOSPITAL_COMMUNITY): Payer: PRIVATE HEALTH INSURANCE

## 2010-12-13 ENCOUNTER — Encounter: Payer: Self-pay | Admitting: Cardiology

## 2010-12-13 ENCOUNTER — Ambulatory Visit (INDEPENDENT_AMBULATORY_CARE_PROVIDER_SITE_OTHER): Payer: PRIVATE HEALTH INSURANCE | Admitting: Cardiology

## 2010-12-13 VITALS — BP 114/64 | HR 79 | Ht 67.0 in | Wt 367.0 lb

## 2010-12-13 DIAGNOSIS — I2510 Atherosclerotic heart disease of native coronary artery without angina pectoris: Secondary | ICD-10-CM

## 2010-12-13 DIAGNOSIS — R0602 Shortness of breath: Secondary | ICD-10-CM

## 2010-12-13 LAB — GLUCOSE, CAPILLARY: Glucose-Capillary: 148 mg/dL — ABNORMAL HIGH (ref 70–99)

## 2010-12-13 NOTE — Patient Instructions (Signed)
Your physician recommends that you schedule a follow-up appointment in: 1 year with Dr. [name]

## 2010-12-13 NOTE — Assessment & Plan Note (Signed)
Noncardiac. Multifactorial most likely secondary to her weight and deconditioning.

## 2010-12-13 NOTE — Assessment & Plan Note (Signed)
Stable. No change in treatment. 

## 2010-12-13 NOTE — Progress Notes (Signed)
HPI [name] returns for shortness of breath and chest pain. Please see the note by Richardson Dopp.  Her echocardiogram showed normal left ventricular function and mild LVH. Her BNP was 15. Chest pain was not felt to be coronary.  Her symptoms have now resolved.  I reviewed her chest today with her and reinforced that her heart is stable. Past Medical History  Diagnosis Date  . COPD (chronic obstructive pulmonary disease)     patient denies this  . Diabetes mellitus   . Diverticular disease   . Hypertension   . Hyperlipidemia   . Coronary artery disease     a. s/p IMI 2004 tx with BMS to RCA;  b. s/p Promus DES to RCA 2/12 (cath: LM 20-30%, pLAD 20-30%, mLAD 50%, RI 30%, pRCA 60%, mRCA 90% - tx with PCI);  c. myoview 1/07: EF 49%, inf scar, no isch  . Urinary incontinence   . Insomnia   . Osteoarthritis   . Morbid obesity   . Obstructive sleep apnea   . Polymyalgia rheumatica   . Anxiety   . Arthritis   . Depression   . Cholelithiasis     Past Surgical History  Procedure Date  . Coronary artery bypass graft   . Appendectomy   . Cardiac catheterization   . Gastric bypass 1977    reversed in 1979    Family History  Problem Relation Age of Onset  . Breast cancer Mother   . Heart disease Mother   . Throat cancer Father   . Hypertension Father   . Arthritis Father   . Diabetes Father   . Arthritis Sister   . Obesity Sister     History   Social History  . Marital Status: Legally Separated    Spouse Name: N/A    Number of Children: N/A  . Years of Education: N/A   Occupational History  . Not on file.   Social History Main Topics  . Smoking status: Never Smoker   . Smokeless tobacco: Not on file  . Alcohol Use: No  . Drug Use: No  . Sexually Active:    Other Topics Concern  . Not on file   Social History Narrative  . No narrative on file    Allergies  Allergen Reactions  . Codeine   . Sulfonamide Derivatives     Current Outpatient Prescriptions   Medication Sig Dispense Refill  . Albuterol Sulfate (VENTOLIN HFA IN) Inhale into the lungs as directed.        [name] aspirin 81 MG EC tablet Take 81 mg by mouth daily.        [name] buPROPion (WELLBUTRIN SR) 150 MG 12 hr tablet Take 150 mg by mouth 2 (two) times daily.        . citalopram (CELEXA) 20 MG tablet Take 20 mg by mouth daily.        . clonazePAM (KLONOPIN) 1 MG tablet Take 1 mg by mouth at bedtime.        . docusate sodium (COLACE) 100 MG capsule Take 100 mg by mouth 2 (two) times daily as needed.       [name] exenatide (BYETTA 10 MCG PEN) 10 MCG/0.04ML SOLN Inject 10 mcg into the skin 2 (two) times daily with a meal.        . HYDROcodone-acetaminophen (VICODIN) 5-500 MG per tablet Take 1 tablet by mouth every 6 (six) hours as needed.        . insulin glargine (LANTUS) 100  UNIT/ML injection Inject 50 Units into the skin.       [name] isosorbide mononitrate (IMDUR) 30 MG 24 hr tablet Take 2 tablets (60 mg total) by mouth daily.  60 tablet  11  . metFORMIN (GLUCOPHAGE) 850 MG tablet Take 850 mg by mouth 3 (three) times daily.        . metoprolol (TOPROL-XL) 50 MG 24 hr tablet 1/2 tab po bid      . nitroGLYCERIN (NITROSTAT) 0.4 MG SL tablet Place 0.4 mg under the tongue every 5 (five) minutes as needed.        . pantoprazole (PROTONIX) 40 MG tablet Take 1 tablet (40 mg total) by mouth 2 (two) times daily.  60 tablet  11  . prasugrel (EFFIENT) 10 MG TABS Take 10 mg by mouth daily.        . pravastatin (PRAVACHOL) 20 MG tablet Take 20 mg by mouth daily.        . predniSONE (DELTASONE) 5 MG tablet Take 10 mg by mouth daily.        . pseudoephedrine (SUDAFED) 30 MG tablet Take 30 mg by mouth every 4 (four) hours as needed.        . triamcinolone (KENALOG) 0.5 % ointment Apply topically 2 (two) times daily.          ROS Negative other than HPI.   PE No acute distress, alert and oriented x3, morbidly obese.  Heart regular rate and rhythm, no murmur or rub, lungs clear, extremities with no significant  edema. Filed Vitals:   12/13/10 1418  BP: 114/64  Pulse: 79  Height: 5\' 7"  (1.702 m)  Weight: 367 lb (166.47 kg)    EKG  Labs and Studies Reviewed.   Lab Results  Component Value Date   WBC 14.8* 09/08/2010   HGB 10.1* 09/08/2010   HCT 32.3* 09/08/2010   MCV 84.6 09/08/2010   PLT 330 09/08/2010      Chemistry      Component Value Date/Time   NA 137 10/21/2010 1519   K 5.9* 10/21/2010 1519   CL 99 10/21/2010 1519   CO2 25 10/21/2010 1519   BUN 18 10/21/2010 1519   CREATININE 1.33* 10/21/2010 1519   CREATININE 1.3* 10/12/2010 1327      Component Value Date/Time   CALCIUM 9.5 10/21/2010 1519   ALKPHOS 66 07/03/2010 1637   AST 20 07/03/2010 1637   ALT 17 07/03/2010 1637   BILITOT 0.4 07/03/2010 1637       Lab Results  Component Value Date   CHOL  Value: 120        ATP III CLASSIFICATION:  <200     mg/dL   Desirable  200-239  mg/dL   Borderline High  >=240    mg/dL   High        06/22/2010   CHOL 118 11/01/2009   Lab Results  Component Value Date   HDL 40 06/22/2010   Lab Results  Component Value Date   LDLCALC  Value: 52        Total Cholesterol/HDL:CHD Risk Coronary Heart Disease Risk Table                     Men   Women  1/2 Average Risk   3.4   3.3  Average Risk       5.0   4.4  2 X Average Risk   9.6   7.1  3 X Average Risk  23.4  11.0        Use the calculated Patient Ratio above and the CHD Risk Table to determine the patient's CHD Risk.        ATP III CLASSIFICATION (LDL):  <100     mg/dL   Optimal  100-129  mg/dL   Near or Above                    Optimal  130-159  mg/dL   Borderline  160-189  mg/dL   High  >190     mg/dL   Very High 06/22/2010   Lab Results  Component Value Date   TRIG 142 06/22/2010   Lab Results  Component Value Date   CHOLHDL 3.0 06/22/2010   Lab Results  Component Value Date   HGBA1C  Value: 9.4 (NOTE)                                                                       According to the ADA Clinical Practice Recommendations for 2011, when HbA1c is  used as a screening test:   >=6.5%   Diagnostic of Diabetes Mellitus           (if abnormal result  is confirmed)  5.7-6.4%   Increased risk of developing Diabetes Mellitus  References:Diagnosis and Classification of Diabetes Mellitus,Diabetes [id number] 1):S62-S69 and Standards of Medical Care in         Diabetes - 2011,Diabetes Care,2011,34  (Suppl 1):S11-S61.* 07/03/2010   Lab Results  Component Value Date   ALT 17 07/03/2010   AST 20 07/03/2010   ALKPHOS 66 07/03/2010   BILITOT 0.4 07/03/2010   Lab Results  Component Value Date   TSH 0.986 07/03/2010

## 2010-12-14 ENCOUNTER — Encounter (HOSPITAL_COMMUNITY): Payer: PRIVATE HEALTH INSURANCE

## 2010-12-16 ENCOUNTER — Encounter (HOSPITAL_COMMUNITY): Payer: PRIVATE HEALTH INSURANCE

## 2010-12-16 ENCOUNTER — Other Ambulatory Visit: Payer: Self-pay | Admitting: Family Medicine

## 2010-12-16 LAB — GLUCOSE, CAPILLARY: Glucose-Capillary: 101 mg/dL — ABNORMAL HIGH (ref 70–99)

## 2010-12-19 ENCOUNTER — Ambulatory Visit (HOSPITAL_COMMUNITY): Payer: No Typology Code available for payment source

## 2010-12-21 ENCOUNTER — Ambulatory Visit (HOSPITAL_COMMUNITY): Payer: No Typology Code available for payment source

## 2010-12-23 ENCOUNTER — Ambulatory Visit (HOSPITAL_COMMUNITY): Payer: No Typology Code available for payment source

## 2010-12-26 ENCOUNTER — Ambulatory Visit (HOSPITAL_COMMUNITY): Payer: No Typology Code available for payment source

## 2010-12-28 ENCOUNTER — Ambulatory Visit (HOSPITAL_COMMUNITY): Payer: No Typology Code available for payment source

## 2010-12-30 ENCOUNTER — Ambulatory Visit (HOSPITAL_COMMUNITY): Payer: No Typology Code available for payment source

## 2011-01-02 ENCOUNTER — Ambulatory Visit (HOSPITAL_COMMUNITY): Payer: No Typology Code available for payment source

## 2011-01-04 ENCOUNTER — Ambulatory Visit (HOSPITAL_COMMUNITY): Payer: No Typology Code available for payment source

## 2011-01-06 ENCOUNTER — Ambulatory Visit (HOSPITAL_COMMUNITY): Payer: No Typology Code available for payment source

## 2011-01-09 ENCOUNTER — Ambulatory Visit (HOSPITAL_COMMUNITY): Payer: No Typology Code available for payment source

## 2011-01-11 ENCOUNTER — Ambulatory Visit (HOSPITAL_COMMUNITY): Payer: No Typology Code available for payment source

## 2011-01-13 ENCOUNTER — Ambulatory Visit (HOSPITAL_COMMUNITY): Payer: No Typology Code available for payment source

## 2011-02-21 LAB — CBC
HCT: 32.9 — ABNORMAL LOW
Hemoglobin: 10.6 — ABNORMAL LOW
MCHC: 32.4
MCV: 82.5
Platelets: 362
RBC: 3.99
RDW: 15.7 — ABNORMAL HIGH
WBC: 12 — ABNORMAL HIGH

## 2011-02-21 LAB — CK TOTAL AND CKMB (NOT AT ARMC)
CK, MB: 1
Relative Index: INVALID
Total CK: 64

## 2011-02-21 LAB — I-STAT 8, (EC8 V) (CONVERTED LAB)
Acid-Base Excess: 3 — ABNORMAL HIGH
BUN: 16
Bicarbonate: 28.2 — ABNORMAL HIGH
Chloride: 102
Glucose, Bld: 141 — ABNORMAL HIGH
HCT: 37
Hemoglobin: 12.6
Operator id: 272551
Potassium: 4
Sodium: 136
TCO2: 30
pCO2, Ven: 45.2
pH, Ven: 7.404 — ABNORMAL HIGH

## 2011-02-21 LAB — DIFFERENTIAL
Basophils Absolute: 0.1
Basophils Relative: 0
Eosinophils Absolute: 0.2
Eosinophils Relative: 2
Lymphocytes Relative: 24
Lymphs Abs: 2.8
Monocytes Absolute: 0.8 — ABNORMAL HIGH
Monocytes Relative: 7
Neutro Abs: 8.1 — ABNORMAL HIGH
Neutrophils Relative %: 68

## 2011-02-21 LAB — POCT CARDIAC MARKERS
CKMB, poc: <1 — ABNORMAL LOW
Myoglobin, poc: 103
Operator id: 272551
Troponin i, poc: <0.05

## 2011-02-21 LAB — CARDIAC PANEL(CRET KIN+CKTOT+MB+TROPI)
CK, MB: 1
CK, MB: 1.1
Relative Index: INVALID
Relative Index: INVALID
Total CK: 67
Total CK: 72
Troponin I: 0.02
Troponin I: 0.03

## 2011-02-21 LAB — LIPID PANEL
Cholesterol: 100
HDL: 35 — ABNORMAL LOW
LDL Cholesterol: 41
Total CHOL/HDL Ratio: 2.9
Triglycerides: 119
VLDL: 24

## 2011-02-21 LAB — PROTIME-INR
INR: 1.1
Prothrombin Time: 13.9

## 2011-02-21 LAB — TROPONIN I: Troponin I: 0.04

## 2011-02-21 LAB — HEPARIN LEVEL (UNFRACTIONATED)
Heparin Unfractionated: 0.23 — ABNORMAL LOW
Heparin Unfractionated: 0.25 — ABNORMAL LOW

## 2011-02-21 LAB — HEMOGLOBIN A1C: Hgb A1c MFr Bld: 8.5 — ABNORMAL HIGH

## 2011-02-21 LAB — POCT I-STAT CREATININE
Creatinine, Ser: 1
Operator id: 272551

## 2011-05-09 HISTORY — PX: OTHER SURGICAL HISTORY: SHX169

## 2011-05-09 LAB — HM MAMMOGRAPHY

## 2011-05-16 ENCOUNTER — Other Ambulatory Visit: Payer: Self-pay | Admitting: *Deleted

## 2011-05-16 ENCOUNTER — Encounter (INDEPENDENT_AMBULATORY_CARE_PROVIDER_SITE_OTHER): Payer: PRIVATE HEALTH INSURANCE | Admitting: *Deleted

## 2011-05-16 DIAGNOSIS — E1159 Type 2 diabetes mellitus with other circulatory complications: Secondary | ICD-10-CM

## 2011-11-08 ENCOUNTER — Other Ambulatory Visit: Payer: Self-pay | Admitting: Family Medicine

## 2011-11-08 DIAGNOSIS — N6320 Unspecified lump in the left breast, unspecified quadrant: Secondary | ICD-10-CM

## 2011-11-15 ENCOUNTER — Encounter (HOSPITAL_BASED_OUTPATIENT_CLINIC_OR_DEPARTMENT_OTHER): Payer: PRIVATE HEALTH INSURANCE | Attending: General Surgery

## 2011-11-21 ENCOUNTER — Ambulatory Visit
Admission: RE | Admit: 2011-11-21 | Discharge: 2011-11-21 | Disposition: A | Discharge status: Home / Self Care | Payer: PRIVATE HEALTH INSURANCE | Source: Ambulatory Visit | Attending: Family Medicine | Admitting: Family Medicine

## 2011-11-21 DIAGNOSIS — N6320 Unspecified lump in the left breast, unspecified quadrant: Secondary | ICD-10-CM

## 2011-12-15 ENCOUNTER — Ambulatory Visit (INDEPENDENT_AMBULATORY_CARE_PROVIDER_SITE_OTHER): Payer: PRIVATE HEALTH INSURANCE | Admitting: Cardiology

## 2011-12-15 ENCOUNTER — Encounter: Payer: Self-pay | Admitting: Cardiology

## 2011-12-15 VITALS — BP 130/66 | HR 77 | Ht 67.0 in | Wt 328.0 lb

## 2011-12-15 DIAGNOSIS — I2510 Atherosclerotic heart disease of native coronary artery without angina pectoris: Secondary | ICD-10-CM

## 2011-12-15 DIAGNOSIS — E119 Type 2 diabetes mellitus without complications: Secondary | ICD-10-CM

## 2011-12-15 DIAGNOSIS — I10 Essential (primary) hypertension: Secondary | ICD-10-CM

## 2011-12-15 DIAGNOSIS — E785 Hyperlipidemia, unspecified: Secondary | ICD-10-CM

## 2011-12-15 NOTE — Progress Notes (Signed)
HPI [name] comes in today for evaluation and management coronary artery disease. She's having no angina or chest pain.  She's been under a lot of emotional stress having lost her husband. She has chronic abdominal pain. She has a lot of problems with constipation but does not take her stool softener irregular basis. She wanted me to review all her medicines to see if this was causing her discomfort. After doing so, I don't think so. She is listed as taking both Percocet and Vicodin. Her daughter assures me that she is only taking Percocet. Para she denies orthopnea, PND or edema.  She has lost a lot of weight on [place]. Blood sugars under good control.  Past Medical History  Diagnosis Date  . COPD (chronic obstructive pulmonary disease)     patient denies this  . Diabetes mellitus   . Diverticular disease   . Hypertension   . Hyperlipidemia   . Coronary artery disease     a. s/p IMI 2004 tx with BMS to RCA;  b. s/p Promus DES to RCA 2/12 (cath: LM 20-30%, pLAD 20-30%, mLAD 50%, RI 30%, pRCA 60%, mRCA 90% - tx with PCI);  c. myoview 1/07: EF 49%, inf scar, no isch  . Urinary incontinence   . Insomnia   . Osteoarthritis   . Morbid obesity   . Obstructive sleep apnea   . Polymyalgia rheumatica   . Anxiety   . Arthritis   . Depression   . Cholelithiasis     Current Outpatient Prescriptions  Medication Sig Dispense Refill  . Albuterol Sulfate (VENTOLIN HFA IN) Inhale into the lungs as directed.        [name] aspirin 81 MG EC tablet Take 81 mg by mouth daily.        . citalopram (CELEXA) 20 MG tablet Take 20 mg by mouth daily.        . clonazePAM (KLONOPIN) 1 MG tablet Take 1 mg by mouth at bedtime.        . docusate sodium (COLACE) 100 MG capsule Take 100 mg by mouth 2 (two) times daily as needed.       [name] exenatide (BYETTA 10 MCG PEN) 10 MCG/0.04ML SOLN Inject 10 mcg into the skin 2 (two) times daily with a meal.        . HYDROcodone-acetaminophen (VICODIN) 5-500 MG per tablet Take 1  tablet by mouth every 6 (six) hours as needed.        . insulin glargine (LANTUS) 100 UNIT/ML injection Inject 50 Units into the skin.       [name] isosorbide mononitrate (IMDUR) 30 MG 24 hr tablet Take 2 tablets (60 mg total) by mouth daily.  60 tablet  11  . metFORMIN (GLUCOPHAGE) 850 MG tablet Take 850 mg by mouth 3 (three) times daily.        . metoprolol (TOPROL-XL) 50 MG 24 hr tablet Take 50 mg by mouth 2 (two) times daily.       . nitroGLYCERIN (NITROSTAT) 0.4 MG SL tablet Place 0.4 mg under the tongue every 5 (five) minutes as needed.        [name] oxyCODONE-acetaminophen (PERCOCET) 10-325 MG per tablet Take 1 tablet by mouth as needed. Occasionally, gives GI upset      . pantoprazole (PROTONIX) 40 MG tablet Take 1 tablet (40 mg total) by mouth 2 (two) times daily.  60 tablet  11  . prasugrel (EFFIENT) 10 MG TABS Take 10 mg by mouth daily.        [name]  pravastatin (PRAVACHOL) 20 MG tablet Take 20 mg by mouth daily.        . pseudoephedrine (SUDAFED) 30 MG tablet Take 30 mg by mouth every 4 (four) hours as needed.        . triamcinolone (KENALOG) 0.5 % ointment Apply topically 2 (two) times daily.          Allergies  Allergen Reactions  . Codeine   . Sulfonamide Derivatives     Family History  Problem Relation Age of Onset  . Breast cancer Mother   . Heart disease Mother   . Throat cancer Father   . Hypertension Father   . Arthritis Father   . Diabetes Father   . Arthritis Sister   . Obesity Sister     History   Social History  . Marital Status: Legally Separated    Spouse Name: N/A    Number of Children: N/A  . Years of Education: N/A   Occupational History  . Not on file.   Social History Main Topics  . Smoking status: Never Smoker   . Smokeless tobacco: Not on file  . Alcohol Use: No  . Drug Use: No  . Sexually Active:    Other Topics Concern  . Not on file   Social History Narrative  . No narrative on file    ROS ALL NEGATIVE EXCEPT THOSE NOTED IN  HPI  PE  General Appearance: well developed, well nourished in no acute distress, morbidly obese HEENT: symmetrical face, PERRLA, good dentition  Neck: no JVD, thyromegaly, or adenopathy, trachea midline Chest: symmetric without deformity Cardiac: PMI non-displaced, RRR, normal S1, S2, no gallop or murmur Lung: clear to ausculation and percussion Vascular: all pulses full without bruits  Abdominal: nondistended, nontender, good bowel sounds, no HSM, no bruits Extremities: no cyanosis, clubbing or edema, no sign of DVT, no varicosities  Skin: normal color, no rashes Neuro: alert and oriented x 3, non-focal Pysch: normal affect  EKG Normal sinus rhythm, normal EKG BMET    Component Value Date/Time   NA 137 10/21/2010 1519   K 5.9* 10/21/2010 1519   CL 99 10/21/2010 1519   CO2 25 10/21/2010 1519   GLUCOSE 187* 10/21/2010 1519   BUN 18 10/21/2010 1519   CREATININE 1.33* 10/21/2010 1519   CREATININE 1.3* 10/12/2010 1327   CALCIUM 9.5 10/21/2010 1519   GFRNONAA 50* 09/08/2010 1010   GFRAA  Value: >60        The eGFR has been calculated using the MDRD equation. This calculation has not been validated in all clinical situations. eGFR's persistently <60 mL/min signify possible Chronic Kidney Disease. 09/08/2010 1010    Lipid Panel     Component Value Date/Time   CHOL  Value: 120        ATP III CLASSIFICATION:  <200     mg/dL   Desirable  200-239  mg/dL   Borderline High  >=240    mg/dL   High        06/22/2010 0355   TRIG 142 06/22/2010 0355   HDL 40 06/22/2010 0355   CHOLHDL 3.0 06/22/2010 0355   VLDL 28 06/22/2010 0355   LDLCALC  Value: 52        Total Cholesterol/HDL:CHD Risk Coronary Heart Disease Risk Table                     Men   Women  1/2 Average Risk   3.4   3.3  Average Risk  5.0   4.4  2 X Average Risk   9.6   7.1  3 X Average Risk  23.4   11.0        Use the calculated Patient Ratio above and the CHD Risk Table to determine the patient's CHD Risk.        ATP III CLASSIFICATION  (LDL):  <100     mg/dL   Optimal  100-129  mg/dL   Near or Above                    Optimal  130-159  mg/dL   Borderline  160-189  mg/dL   High  >190     mg/dL   Very High 06/22/2010 0355    CBC    Component Value Date/Time   WBC 14.8* 09/08/2010 1010   RBC 3.82* 09/08/2010 1010   HGB 10.1* 09/08/2010 1010   HCT 32.3* 09/08/2010 1010   PLT 330 09/08/2010 1010   MCV 84.6 09/08/2010 1010   MCH 26.4 09/08/2010 1010   MCHC 31.3 09/08/2010 1010   RDW 14.6 09/08/2010 1010   LYMPHSABS 3.9 09/08/2010 1010   MONOABS 0.7 09/08/2010 1010   EOSABS 0.2 09/08/2010 1010   BASOSABS 0.0 09/08/2010 1010

## 2011-12-15 NOTE — Assessment & Plan Note (Signed)
Stable. Continue current secondary preventative therapy. Return the office in a year.

## 2011-12-15 NOTE — Patient Instructions (Addendum)
Your physician recommends that you continue on your current medications as directed. Please refer to the Current Medication list given to you today.  Your physician recommends that you schedule a follow-up appointment in: 1year with Dr. [name].

## 2011-12-20 ENCOUNTER — Encounter (HOSPITAL_BASED_OUTPATIENT_CLINIC_OR_DEPARTMENT_OTHER): Payer: PRIVATE HEALTH INSURANCE | Attending: General Surgery

## 2011-12-31 ENCOUNTER — Emergency Department (HOSPITAL_COMMUNITY)
Admission: EM | Admit: 2011-12-31 | Discharge: 2012-01-01 | Disposition: A | Discharge status: Home / Self Care | Payer: Medicare (Managed Care) | Attending: Emergency Medicine | Admitting: Emergency Medicine

## 2011-12-31 ENCOUNTER — Emergency Department (HOSPITAL_COMMUNITY): Payer: Medicare (Managed Care)

## 2011-12-31 ENCOUNTER — Encounter (HOSPITAL_COMMUNITY): Payer: Self-pay | Admitting: Emergency Medicine

## 2011-12-31 DIAGNOSIS — Z951 Presence of aortocoronary bypass graft: Secondary | ICD-10-CM | POA: Insufficient documentation

## 2011-12-31 DIAGNOSIS — F3289 Other specified depressive episodes: Secondary | ICD-10-CM | POA: Insufficient documentation

## 2011-12-31 DIAGNOSIS — N390 Urinary tract infection, site not specified: Secondary | ICD-10-CM

## 2011-12-31 DIAGNOSIS — I10 Essential (primary) hypertension: Secondary | ICD-10-CM | POA: Insufficient documentation

## 2011-12-31 DIAGNOSIS — J4489 Other specified chronic obstructive pulmonary disease: Secondary | ICD-10-CM | POA: Insufficient documentation

## 2011-12-31 DIAGNOSIS — E785 Hyperlipidemia, unspecified: Secondary | ICD-10-CM | POA: Insufficient documentation

## 2011-12-31 DIAGNOSIS — Z9089 Acquired absence of other organs: Secondary | ICD-10-CM | POA: Insufficient documentation

## 2011-12-31 DIAGNOSIS — E119 Type 2 diabetes mellitus without complications: Secondary | ICD-10-CM | POA: Insufficient documentation

## 2011-12-31 DIAGNOSIS — F329 Major depressive disorder, single episode, unspecified: Secondary | ICD-10-CM | POA: Insufficient documentation

## 2011-12-31 DIAGNOSIS — Z7982 Long term (current) use of aspirin: Secondary | ICD-10-CM | POA: Insufficient documentation

## 2011-12-31 DIAGNOSIS — Z79899 Other long term (current) drug therapy: Secondary | ICD-10-CM | POA: Insufficient documentation

## 2011-12-31 DIAGNOSIS — I2510 Atherosclerotic heart disease of native coronary artery without angina pectoris: Secondary | ICD-10-CM | POA: Insufficient documentation

## 2011-12-31 DIAGNOSIS — J449 Chronic obstructive pulmonary disease, unspecified: Secondary | ICD-10-CM | POA: Insufficient documentation

## 2011-12-31 LAB — URINALYSIS, ROUTINE W REFLEX MICROSCOPIC
Bilirubin Urine: NEGATIVE
Glucose, UA: NEGATIVE mg/dL
Hgb urine dipstick: NEGATIVE
Ketones, ur: NEGATIVE mg/dL
Nitrite: POSITIVE — AB
Protein, ur: NEGATIVE mg/dL
Specific Gravity, Urine: 1.015 (ref 1.005–1.030)
Urobilinogen, UA: 0.2 mg/dL (ref 0.0–1.0)
pH: 5.5 (ref 5.0–8.0)

## 2011-12-31 LAB — CBC WITH DIFFERENTIAL/PLATELET
Basophils Absolute: 0 10*3/uL (ref 0.0–0.1)
Basophils Relative: 0 % (ref 0–1)
Eosinophils Absolute: 0.2 10*3/uL (ref 0.0–0.7)
Eosinophils Relative: 2 % (ref 0–5)
HCT: 30.2 % — ABNORMAL LOW (ref 36.0–46.0)
Hemoglobin: 9.7 g/dL — ABNORMAL LOW (ref 12.0–15.0)
Lymphocytes Relative: 26 % (ref 12–46)
Lymphs Abs: 2.5 10*3/uL (ref 0.7–4.0)
MCH: 27 pg (ref 26.0–34.0)
MCHC: 32.1 g/dL (ref 30.0–36.0)
MCV: 84.1 fL (ref 78.0–100.0)
Monocytes Absolute: 0.7 10*3/uL (ref 0.1–1.0)
Monocytes Relative: 7 % (ref 3–12)
Neutro Abs: 6.1 10*3/uL (ref 1.7–7.7)
Neutrophils Relative %: 64 % (ref 43–77)
Platelets: 316 10*3/uL (ref 150–400)
RBC: 3.59 MIL/uL — ABNORMAL LOW (ref 3.87–5.11)
RDW: 15.4 % (ref 11.5–15.5)
WBC: 9.5 10*3/uL (ref 4.0–10.5)

## 2011-12-31 LAB — BASIC METABOLIC PANEL
BUN: 25 mg/dL — ABNORMAL HIGH (ref 6–23)
CO2: 21 mEq/L (ref 19–32)
Calcium: 9.1 mg/dL (ref 8.4–10.5)
Chloride: 104 mEq/L (ref 96–112)
Creatinine, Ser: 1.43 mg/dL — ABNORMAL HIGH (ref 0.50–1.10)
GFR calc Af Amer: 40 mL/min — ABNORMAL LOW (ref >90)
GFR calc non Af Amer: 35 mL/min — ABNORMAL LOW (ref >90)
Glucose, Bld: 215 mg/dL — ABNORMAL HIGH (ref 70–99)
Potassium: 5.2 mEq/L — ABNORMAL HIGH (ref 3.5–5.1)
Sodium: 136 mEq/L (ref 135–145)

## 2011-12-31 LAB — URINE MICROSCOPIC-ADD ON

## 2011-12-31 LAB — GLUCOSE, CAPILLARY
Glucose-Capillary: 196 mg/dL — ABNORMAL HIGH (ref 70–99)
Glucose-Capillary: 154 mg/dL — ABNORMAL HIGH (ref 70–99)

## 2011-12-31 MED ORDER — ACETAMINOPHEN 325 MG PO TABS
650.0000 mg | ORAL_TABLET | Freq: Once | ORAL | Status: AC
  Administered 2011-12-31: 650 mg via ORAL
  Filled 2011-12-31: qty 2

## 2011-12-31 MED ORDER — CIPROFLOXACIN HCL 250 MG PO TABS: 250.0000 mg | ORAL_TABLET | Freq: Two times a day (BID) | ORAL | Status: AC

## 2011-12-31 NOTE — ED Notes (Signed)
Patient was explained discharge instructions to her and her family.  Family did not have any questions.  Patient was taken home by family.

## 2011-12-31 NOTE — ED Notes (Signed)
Sent a transporter to pick up the patient around 18:00, the patient was refusing the scan at that time, RN made aware of this.  Pt still not agreeable to have CT scan @ 19:00, RN to talk to MD.  Will hold off on Ct scan until we hear from RN.

## 2011-12-31 NOTE — ED Notes (Signed)
Patient's family member escorted back to patient's room; one visitor at this time.

## 2011-12-31 NOTE — ED Notes (Addendum)
Patient's family says patient has been catatonic, since 1530hrs after visiting the [place].  Her spouse recently died unexpectedly in November 26, 2022.  Patient has not said a work the whole time since 1530hrs.  Family said they could not get her to talk or do anything so they called EMS.  Vitals are normal and EKG showed normal sinus rhythm.  Pupils are PERRLA. Report received  From Vincente Poli.

## 2011-12-31 NOTE — ED Provider Notes (Signed)
History     CSN: [id number]  Arrival date & time 12/31/11  1721   First MD Initiated Contact with Patient 12/31/11 1733      Chief Complaint  Patient presents with  . Altered Mental Status    (Consider location/radiation/quality/duration/timing/severity/associated sxs/prior treatment) HPI Hx from family. [name] is a 77 y.o. female with hx DM, CAD, HTN, HLD who presents with altered mental status. Her family reports that her husband recently died. [place] and [place] her home afterwards. Her daughter called her house around 3:00 PM today. She did not answer the phone, so she went to the house to check on her. She was found lying on the bed around 3:30 pm. She would not answer questions or talk to them. EMS was called. She has no history of the same previously. Her daughter suspects that she is likely depressed. Daughter reports that she has not been taking her medications as prescribed except for her diabetes medications. Level V caveat 2/2 altered mental status.  Past Medical History  Diagnosis Date  . COPD (chronic obstructive pulmonary disease)     patient denies this  . Diabetes mellitus   . Diverticular disease   . Hypertension   . Hyperlipidemia   . Coronary artery disease     a. s/p IMI 2004 tx with BMS to RCA;  b. s/p Promus DES to RCA 2/12 (cath: LM 20-30%, pLAD 20-30%, mLAD 50%, RI 30%, pRCA 60%, mRCA 90% - tx with PCI);  c. myoview 1/07: EF 49%, inf scar, no isch  . Urinary incontinence   . Insomnia   . Osteoarthritis   . Morbid obesity   . Obstructive sleep apnea   . Polymyalgia rheumatica   . Anxiety   . Arthritis   . Depression   . Cholelithiasis     Past Surgical History  Procedure Date  . Coronary artery bypass graft   . Appendectomy   . Cardiac catheterization   . Gastric bypass 1977    reversed in 1979    Family History  Problem Relation Age of Onset  . Breast cancer Mother   . Heart disease Mother   .  Throat cancer Father   . Hypertension Father   . Arthritis Father   . Diabetes Father   . Arthritis Sister   . Obesity Sister     History  Substance Use Topics  . Smoking status: Never Smoker   . Smokeless tobacco: Not on file  . Alcohol Use: No    OB History    Grav Para Term Preterm Abortions TAB SAB Ect Mult Living                  Review of Systems  Unable to perform ROS: Mental status change    Allergies  Codeine and Sulfonamide derivatives  Home Medications   Current Outpatient Rx  Name Route Sig Dispense Refill  . ASPIRIN 81 MG PO CHEW Oral Chew 81 mg by mouth daily.    [name] CLONAZEPAM 1 MG PO TABS Oral Take 1-2 mg by mouth 2 (two) times daily. 1mg  qam and 2mg  qhs    . DOCUSATE SODIUM 100 MG PO CAPS Oral Take 300 mg by mouth daily.     . DULOXETINE HCL 60 MG PO CPEP Oral Take 60 mg by mouth daily.    [name] EXENATIDE 10 MCG/0.04ML [place] SOLN Subcutaneous Inject 10 mcg into the skin 2 (two) times daily with a meal.      .  INSULIN GLARGINE 100 UNIT/ML  SOLN Subcutaneous Inject 30 Units into the skin daily.     . ISOSORBIDE MONONITRATE ER 60 MG PO TB24 Oral Take 60 mg by mouth daily.    [name] BACID PO TABS Oral Take 1 tablet by mouth daily.    [name] LISINOPRIL 10 MG PO TABS Oral Take 10 mg by mouth daily.    [name] LORAZEPAM 1 MG PO TABS Oral Take 2-3 mg by mouth at bedtime as needed. For anxiety. According to mar, pt can take up to 3 tabs as needed.    [name] METFORMIN HCL 850 MG PO TABS Oral Take 850 mg by mouth 3 (three) times daily.      [name] METOPROLOL SUCCINATE ER 25 MG PO TB24 Oral Take 25 mg by mouth 2 (two) times daily.    [name] NITROGLYCERIN 0.4 MG SL SUBL Sublingual Place 0.4 mg under the tongue every 5 (five) minutes as needed.      . OXYCODONE-ACETAMINOPHEN 10-325 MG PO TABS Oral Take 1 tablet by mouth 2 (two) times daily as needed. For severe pain    . PANTOPRAZOLE SODIUM 40 MG PO TBEC Oral Take 40 mg by mouth 2 (two) times daily.    [name] PRASUGREL HCL 10 MG PO TABS Oral Take 10 mg by  mouth daily.      [name] PRAVASTATIN SODIUM 20 MG PO TABS Oral Take 20 mg by mouth daily.      . TRIAMCINOLONE ACETONIDE 0.5 % EX OINT Topical Apply topically 2 (two) times daily.        BP 139/55  Pulse 81  Temp 98.1 F (36.7 C) (Oral)  Resp 16  SpO2 100%  Physical Exam  Nursing note and vitals reviewed. Constitutional: She appears well-developed and well-nourished. No distress.  HENT:  Head: Normocephalic and atraumatic.  Eyes: Pupils are equal, round, and reactive to light.  Neck: Normal range of motion. Neck supple.  Cardiovascular: Normal rate, regular rhythm and normal heart sounds.   Pulmonary/Chest: Effort normal and breath sounds normal.  Abdominal: Soft. Bowel sounds are normal. There is no tenderness. There is no rebound and no guarding.  Musculoskeletal: Normal range of motion.  Neurological: She is alert.       Nonverbal, uncooperative with neuro exam, tearful Does push down when arms held up Withdraws from painful stimulus and moves all extremities (palpation of knees - she has severe OA of bilat knees)  Skin: Skin is warm and dry. She is not diaphoretic.  Psychiatric: She has a normal mood and affect.    ED Course  Procedures (including critical care time)  Date: 12/31/2011  Rate: 81  Rhythm: normal sinus rhythm  QRS Axis: normal  Intervals: normal  ST/T Wave abnormalities: nonspecific ST changes, probably old inferior infarct  Conduction Disutrbances:none  Narrative Interpretation:   Old EKG Reviewed: as compared with May 2012, rate increased  Labs Reviewed  GLUCOSE, CAPILLARY - Abnormal; Notable for the following:    Glucose-Capillary 196 (*)     All other components within normal limits  CBC WITH DIFFERENTIAL - Abnormal; Notable for the following:    RBC 3.59 (*)     Hemoglobin 9.7 (*)     HCT 30.2 (*)     All other components within normal limits  BASIC METABOLIC PANEL - Abnormal; Notable for the following:    Potassium 5.2 (*)     Glucose, Bld 215  (*)     BUN 25 (*)     Creatinine, Ser  1.43 (*)     GFR calc non Af Amer 35 (*)     GFR calc Af Amer 40 (*)     All other components within normal limits  URINALYSIS, ROUTINE W REFLEX MICROSCOPIC - Abnormal; Notable for the following:    APPearance CLOUDY (*)     Nitrite POSITIVE (*)     Leukocytes, UA MODERATE (*)     All other components within normal limits  URINE MICROSCOPIC-ADD ON - Abnormal; Notable for the following:    Squamous Epithelial / LPF FEW (*)     Bacteria, UA MANY (*)     All other components within normal limits  GLUCOSE, CAPILLARY - Abnormal; Notable for the following:    Glucose-Capillary 154 (*)     All other components within normal limits  URINE CULTURE   Ct Head Wo Contrast  12/31/2011  *RADIOLOGY REPORT*  Clinical Data: Eight fascia  CT HEAD WITHOUT CONTRAST  Technique:  Contiguous axial images were obtained from the base of the skull through the vertex without contrast.  Comparison: 05/28/2010  Findings: Periventricular and subcortical white matter hypodensities are most in keeping with chronic microangiopathic change.  Nonspecific area of hypoattenuation involving the right cerebellopontine junction as seen on series 2 image 8. May be accentuated by unconventional positioning of the patient.  No intraparenchymal hemorrhage, mass effect, or abnormal extra-axial fluid collection.  No overt hydrocephalus. The visualized paranasal sinuses and mastoid air cells are predominately clear.  IMPRESSION: Right cerebellopontine hypoattenuation is favored artifactual. However, if clinical concern for acute ischemia persists, MRI follow-up is recommended.   Original Report Authenticated By: Suanne Marker, M.D.    Dg Chest Port 1 View  12/31/2011  *RADIOLOGY REPORT*  Clinical Data: Persistent cough for 2 months.  PORTABLE CHEST - 1 VIEW  Comparison: PA and lateral chest 09/08/2010.  Findings: Lungs are clear.  Heart size is normal.  No pneumothorax or pleural fluid.   Degenerative change about the shoulders noted.  IMPRESSION: No acute finding.   Original Report Authenticated By: Arvid Right. D'ALESSIO, M.D.      1. DEPRESSIVE DISORDER   2. Urinary tract infection       MDM  Patient presents from home and is nonverbal which is not her baseline per family. She did recently suffer a loss of her spouse. She is generally uncooperative with neurologic exam. However, she does withdraw to pain and moves all extremities. Patient was observed throughout the ED stay, and on recheck, does respond yes and no to questions appropriately and makes short statements. Patient noted to have poor eye contact and is tearful. She denies suicidal or homicidal ideation. CT of the head shows possible cerebellopontine infarct which is favored to the motion artifact on the right side. This does not correspond to the area of the patient's symptoms and she is now answering questions so do not feel she needs an MR of the head this time. Her urine is positive for UTI. Burtis Junes this as a grief reaction. Patient was given a list of counselors to make a followup appointment. She was strongly encouraged to make a close followup with her primary care Dr as well. Reasons to return to the emergency department discussed.        Abran Richard, PA-C 01/01/12 [phone]

## 2012-01-01 LAB — OCCULT BLOOD, POC DEVICE: Fecal Occult Bld: NEGATIVE

## 2012-01-02 NOTE — ED Provider Notes (Signed)
Medical screening examination/treatment/procedure(s) were conducted as a shared visit with non-physician practitioner(s) and myself.  I personally evaluated the patient during the encounter  [name] is a 77 y.o. female hx of DM, CAD, HTN, HL here with AMS. Her husband died recently and that she was very depressed. Family found her not to answer questions around 3pm and she appeared tearful. Patient doesn't answer many questions during interview but denies active suicidal ideations or homicidal ideations.   Vitals stable. Heart, lung exam unremarkable. She is able to perform finger to nose without a problem but often refuses to cooperate with exam. Moving all extremities. Labs showed CBC, CMP at baseline. UA + UTI. CT head showed likely R cerebellopontine hypoattenuation, likely artifact. CXR showed no acute findings.   Patient's mental status change is likely secondary to depression vs UTI. It is unlikely to be stroke given the history and nonfocal neuro exam. I discussed with patient and family. She is to finish a course of abx at home and follow up with a psychiatrist or counselor. If her mental status changes, she has new weakness or numbness, she is return to the ER for re evaluation.    Wandra Arthurs, MD 01/02/12 [phone]

## 2012-01-04 LAB — URINE CULTURE: Colony Count: >=100000

## 2012-01-05 NOTE — ED Notes (Signed)
+  Urine. Patient treated with Cipro. Sensitive to same. Per protocol MD. °

## 2012-01-07 DIAGNOSIS — I2699 Other pulmonary embolism without acute cor pulmonale: Secondary | ICD-10-CM

## 2012-01-07 HISTORY — DX: Other pulmonary embolism without acute cor pulmonale: I26.99

## 2012-01-25 ENCOUNTER — Emergency Department (HOSPITAL_COMMUNITY): Payer: Medicare Other

## 2012-01-25 ENCOUNTER — Encounter (HOSPITAL_COMMUNITY): Payer: Self-pay

## 2012-01-25 ENCOUNTER — Inpatient Hospital Stay (HOSPITAL_COMMUNITY)
Admission: EM | Admit: 2012-01-25 | Discharge: 2012-02-01 | DRG: 175 | Disposition: A | Discharge status: Home Health | Payer: Medicare Other | Attending: Internal Medicine | Admitting: Internal Medicine

## 2012-01-25 DIAGNOSIS — K625 Hemorrhage of anus and rectum: Secondary | ICD-10-CM

## 2012-01-25 DIAGNOSIS — I5032 Chronic diastolic (congestive) heart failure: Secondary | ICD-10-CM | POA: Diagnosis present

## 2012-01-25 DIAGNOSIS — I10 Essential (primary) hypertension: Secondary | ICD-10-CM

## 2012-01-25 DIAGNOSIS — D509 Iron deficiency anemia, unspecified: Secondary | ICD-10-CM | POA: Diagnosis present

## 2012-01-25 DIAGNOSIS — I82409 Acute embolism and thrombosis of unspecified deep veins of unspecified lower extremity: Secondary | ICD-10-CM

## 2012-01-25 DIAGNOSIS — M1990 Unspecified osteoarthritis, unspecified site: Secondary | ICD-10-CM

## 2012-01-25 DIAGNOSIS — D649 Anemia, unspecified: Secondary | ICD-10-CM

## 2012-01-25 DIAGNOSIS — R7989 Other specified abnormal findings of blood chemistry: Secondary | ICD-10-CM

## 2012-01-25 DIAGNOSIS — I5033 Acute on chronic diastolic (congestive) heart failure: Secondary | ICD-10-CM

## 2012-01-25 DIAGNOSIS — R0989 Other specified symptoms and signs involving the circulatory and respiratory systems: Secondary | ICD-10-CM

## 2012-01-25 DIAGNOSIS — R109 Unspecified abdominal pain: Secondary | ICD-10-CM

## 2012-01-25 DIAGNOSIS — M353 Polymyalgia rheumatica: Secondary | ICD-10-CM

## 2012-01-25 DIAGNOSIS — G44209 Tension-type headache, unspecified, not intractable: Secondary | ICD-10-CM

## 2012-01-25 DIAGNOSIS — E1129 Type 2 diabetes mellitus with other diabetic kidney complication: Secondary | ICD-10-CM | POA: Diagnosis present

## 2012-01-25 DIAGNOSIS — F329 Major depressive disorder, single episode, unspecified: Secondary | ICD-10-CM

## 2012-01-25 DIAGNOSIS — F411 Generalized anxiety disorder: Secondary | ICD-10-CM | POA: Diagnosis present

## 2012-01-25 DIAGNOSIS — Z794 Long term (current) use of insulin: Secondary | ICD-10-CM

## 2012-01-25 DIAGNOSIS — E785 Hyperlipidemia, unspecified: Secondary | ICD-10-CM

## 2012-01-25 DIAGNOSIS — I4510 Unspecified right bundle-branch block: Secondary | ICD-10-CM | POA: Diagnosis present

## 2012-01-25 DIAGNOSIS — Z79899 Other long term (current) drug therapy: Secondary | ICD-10-CM

## 2012-01-25 DIAGNOSIS — E662 Morbid (severe) obesity with alveolar hypoventilation: Secondary | ICD-10-CM | POA: Diagnosis present

## 2012-01-25 DIAGNOSIS — E119 Type 2 diabetes mellitus without complications: Secondary | ICD-10-CM

## 2012-01-25 DIAGNOSIS — R0602 Shortness of breath: Secondary | ICD-10-CM

## 2012-01-25 DIAGNOSIS — K8020 Calculus of gallbladder without cholecystitis without obstruction: Secondary | ICD-10-CM

## 2012-01-25 DIAGNOSIS — R0601 Orthopnea: Secondary | ICD-10-CM

## 2012-01-25 DIAGNOSIS — I2699 Other pulmonary embolism without acute cor pulmonale: Principal | ICD-10-CM

## 2012-01-25 DIAGNOSIS — J4489 Other specified chronic obstructive pulmonary disease: Secondary | ICD-10-CM

## 2012-01-25 DIAGNOSIS — B369 Superficial mycosis, unspecified: Secondary | ICD-10-CM

## 2012-01-25 DIAGNOSIS — I129 Hypertensive chronic kidney disease with stage 1 through stage 4 chronic kidney disease, or unspecified chronic kidney disease: Secondary | ICD-10-CM | POA: Diagnosis present

## 2012-01-25 DIAGNOSIS — Z86718 Personal history of other venous thrombosis and embolism: Secondary | ICD-10-CM | POA: Diagnosis not present

## 2012-01-25 DIAGNOSIS — Z8719 Personal history of other diseases of the digestive system: Secondary | ICD-10-CM

## 2012-01-25 DIAGNOSIS — I824Y9 Acute embolism and thrombosis of unspecified deep veins of unspecified proximal lower extremity: Secondary | ICD-10-CM | POA: Diagnosis present

## 2012-01-25 DIAGNOSIS — M109 Gout, unspecified: Secondary | ICD-10-CM | POA: Diagnosis not present

## 2012-01-25 DIAGNOSIS — Z9884 Bariatric surgery status: Secondary | ICD-10-CM

## 2012-01-25 DIAGNOSIS — Z9861 Coronary angioplasty status: Secondary | ICD-10-CM

## 2012-01-25 DIAGNOSIS — E66813 Obesity, class 3: Secondary | ICD-10-CM | POA: Diagnosis present

## 2012-01-25 DIAGNOSIS — M722 Plantar fascial fibromatosis: Secondary | ICD-10-CM | POA: Diagnosis not present

## 2012-01-25 DIAGNOSIS — L509 Urticaria, unspecified: Secondary | ICD-10-CM

## 2012-01-25 DIAGNOSIS — F3289 Other specified depressive episodes: Secondary | ICD-10-CM

## 2012-01-25 DIAGNOSIS — R32 Unspecified urinary incontinence: Secondary | ICD-10-CM

## 2012-01-25 DIAGNOSIS — N390 Urinary tract infection, site not specified: Secondary | ICD-10-CM

## 2012-01-25 DIAGNOSIS — R778 Other specified abnormalities of plasma proteins: Secondary | ICD-10-CM | POA: Diagnosis present

## 2012-01-25 DIAGNOSIS — N183 Chronic kidney disease, stage 3 unspecified: Secondary | ICD-10-CM | POA: Diagnosis present

## 2012-01-25 DIAGNOSIS — R079 Chest pain, unspecified: Secondary | ICD-10-CM

## 2012-01-25 DIAGNOSIS — G4733 Obstructive sleep apnea (adult) (pediatric): Secondary | ICD-10-CM | POA: Diagnosis present

## 2012-01-25 DIAGNOSIS — I959 Hypotension, unspecified: Secondary | ICD-10-CM

## 2012-01-25 DIAGNOSIS — I25118 Atherosclerotic heart disease of native coronary artery with other forms of angina pectoris: Secondary | ICD-10-CM | POA: Diagnosis present

## 2012-01-25 DIAGNOSIS — J449 Chronic obstructive pulmonary disease, unspecified: Secondary | ICD-10-CM

## 2012-01-25 DIAGNOSIS — Z6841 Body Mass Index (BMI) 40.0 and over, adult: Secondary | ICD-10-CM

## 2012-01-25 DIAGNOSIS — D72829 Elevated white blood cell count, unspecified: Secondary | ICD-10-CM | POA: Diagnosis present

## 2012-01-25 DIAGNOSIS — R0609 Other forms of dyspnea: Secondary | ICD-10-CM

## 2012-01-25 DIAGNOSIS — Z7982 Long term (current) use of aspirin: Secondary | ICD-10-CM

## 2012-01-25 DIAGNOSIS — R51 Headache: Secondary | ICD-10-CM

## 2012-01-25 DIAGNOSIS — I2510 Atherosclerotic heart disease of native coronary artery without angina pectoris: Secondary | ICD-10-CM

## 2012-01-25 DIAGNOSIS — R570 Cardiogenic shock: Secondary | ICD-10-CM | POA: Diagnosis present

## 2012-01-25 LAB — CBC WITH DIFFERENTIAL/PLATELET
Basophils Absolute: 0 10*3/uL (ref 0.0–0.1)
Basophils Relative: 0 % (ref 0–1)
Eosinophils Absolute: 0.2 10*3/uL (ref 0.0–0.7)
Eosinophils Relative: 1 % (ref 0–5)
HCT: 31.1 % — ABNORMAL LOW (ref 36.0–46.0)
Hemoglobin: 9.8 g/dL — ABNORMAL LOW (ref 12.0–15.0)
Lymphocytes Relative: 20 % (ref 12–46)
Lymphs Abs: 2.9 10*3/uL (ref 0.7–4.0)
MCH: 27.1 pg (ref 26.0–34.0)
MCHC: 31.5 g/dL (ref 30.0–36.0)
MCV: 85.9 fL (ref 78.0–100.0)
Monocytes Absolute: 0.8 10*3/uL (ref 0.1–1.0)
Monocytes Relative: 5 % (ref 3–12)
Neutro Abs: 10.9 10*3/uL — ABNORMAL HIGH (ref 1.7–7.7)
Neutrophils Relative %: 74 % (ref 43–77)
Platelets: 284 10*3/uL (ref 150–400)
RBC: 3.62 MIL/uL — ABNORMAL LOW (ref 3.87–5.11)
RDW: 15.1 % (ref 11.5–15.5)
WBC: 14.7 10*3/uL — ABNORMAL HIGH (ref 4.0–10.5)

## 2012-01-25 LAB — LACTIC ACID, PLASMA: Lactic Acid, Venous: 2.6 mmol/L — ABNORMAL HIGH (ref 0.5–2.2)

## 2012-01-25 LAB — COMPREHENSIVE METABOLIC PANEL
ALT: 21 U/L (ref 0–35)
AST: 24 U/L (ref 0–37)
Albumin: 3.2 g/dL — ABNORMAL LOW (ref 3.5–5.2)
Alkaline Phosphatase: 80 U/L (ref 39–117)
BUN: 19 mg/dL (ref 6–23)
CO2: 22 mEq/L (ref 19–32)
Calcium: 9.2 mg/dL (ref 8.4–10.5)
Chloride: 97 mEq/L (ref 96–112)
Creatinine, Ser: 1.47 mg/dL — ABNORMAL HIGH (ref 0.50–1.10)
GFR calc Af Amer: 39 mL/min — ABNORMAL LOW (ref >90)
GFR calc non Af Amer: 34 mL/min — ABNORMAL LOW (ref >90)
Glucose, Bld: 266 mg/dL — ABNORMAL HIGH (ref 70–99)
Potassium: 5.1 mEq/L (ref 3.5–5.1)
Sodium: 133 mEq/L — ABNORMAL LOW (ref 135–145)
Total Bilirubin: 0.3 mg/dL (ref 0.3–1.2)
Total Protein: 7.5 g/dL (ref 6.0–8.3)

## 2012-01-25 LAB — LIPASE, BLOOD: Lipase: 52 U/L (ref 11–59)

## 2012-01-25 LAB — TROPONIN I: Troponin I: 1.14 ng/mL (ref <0.30)

## 2012-01-25 MED ORDER — IOHEXOL 300 MG/ML  SOLN
80.0000 mL | Freq: Once | INTRAMUSCULAR | Status: AC | PRN
  Administered 2012-01-25: 80 mL via INTRAVENOUS

## 2012-01-25 MED ORDER — LORAZEPAM 2 MG/ML IJ SOLN
0.5000 mg | Freq: Once | INTRAMUSCULAR | Status: DC
  Filled 2012-01-25: qty 1

## 2012-01-25 MED ORDER — IOHEXOL 300 MG/ML  SOLN
20.0000 mL | INTRAMUSCULAR | Status: AC
  Administered 2012-01-25 (×2): 20 mL via ORAL

## 2012-01-25 MED ORDER — LORAZEPAM 2 MG/ML IJ SOLN
0.5000 mg | Freq: Once | INTRAMUSCULAR | Status: AC
  Administered 2012-01-25: 0.5 mg via INTRAVENOUS

## 2012-01-25 MED ORDER — SODIUM CHLORIDE 0.9 % IV BOLUS (SEPSIS)
1000.0000 mL | INTRAVENOUS | Status: AC
  Administered 2012-01-25: 1000 mL via INTRAVENOUS

## 2012-01-25 MED ORDER — SODIUM CHLORIDE 0.9 % IV BOLUS (SEPSIS)
1000.0000 mL | Freq: Once | INTRAVENOUS | Status: AC
Start: 2012-01-25 — End: 2012-01-25
  Administered 2012-01-25: 1000 mL via INTRAVENOUS

## 2012-01-25 MED ORDER — ASPIRIN 81 MG PO CHEW
324.0000 mg | CHEWABLE_TABLET | Freq: Once | ORAL | Status: AC
  Filled 2012-01-25: qty 4

## 2012-01-25 NOTE — ED Provider Notes (Signed)
History     CSN: [id number]  Arrival date & time 01/25/12  1827   First MD Initiated Contact with Patient 01/25/12 1836      Chief Complaint  Patient presents with  . Chest Pain    (Consider location/radiation/quality/duration/timing/severity/associated sxs/prior treatment) Patient is a 77 y.o. female presenting with abdominal pain. The history is provided by the patient.  Abdominal Pain The primary symptoms of the illness include abdominal pain, shortness of breath and nausea. The primary symptoms of the illness do not include fever, fatigue, vomiting, diarrhea or dysuria. The current episode started more than 2 days ago. The onset of the illness was gradual. The problem has not changed since onset. The abdominal pain began more than 2 days ago. The pain came on gradually. The abdominal pain has been unchanged since its onset. The abdominal pain is located in the epigastric region. The abdominal pain does not radiate. The severity of the abdominal pain is 8/10. The abdominal pain is relieved by nothing. Exacerbated by: nothing.  Associated with: unknown. The patient has not had a change in bowel habit. Additional symptoms associated with the illness include diaphoresis. Symptoms associated with the illness do not include hematuria or back pain.    Past Medical History  Diagnosis Date  . COPD (chronic obstructive pulmonary disease)     patient denies this  . Diabetes mellitus   . Diverticular disease   . Hypertension   . Hyperlipidemia   . Coronary artery disease     a. s/p IMI 2004 tx with BMS to RCA;  b. s/p Promus DES to RCA 2/12 (cath: LM 20-30%, pLAD 20-30%, mLAD 50%, RI 30%, pRCA 60%, mRCA 90% - tx with PCI);  c. myoview 1/07: EF 49%, inf scar, no isch  . Urinary incontinence   . Insomnia   . Osteoarthritis   . Morbid obesity   . Obstructive sleep apnea   . Polymyalgia rheumatica   . Anxiety   . Arthritis   . Depression   . Cholelithiasis     Past Surgical History    Procedure Date  . Coronary artery bypass graft   . Appendectomy   . Cardiac catheterization   . Gastric bypass 1977    reversed in 1979    Family History  Problem Relation Age of Onset  . Breast cancer Mother   . Heart disease Mother   . Throat cancer Father   . Hypertension Father   . Arthritis Father   . Diabetes Father   . Arthritis Sister   . Obesity Sister     History  Substance Use Topics  . Smoking status: Never Smoker   . Smokeless tobacco: Not on file  . Alcohol Use: No    OB History    Grav Para Term Preterm Abortions TAB SAB Ect Mult Living                  Review of Systems  Constitutional: Positive for diaphoresis. Negative for fever and fatigue.  HENT: Negative for congestion, drooling and neck pain.   Eyes: Negative for pain.  Respiratory: Positive for shortness of breath. Negative for cough.   Cardiovascular: Positive for chest pain.  Gastrointestinal: Positive for nausea and abdominal pain. Negative for vomiting and diarrhea.  Genitourinary: Negative for dysuria and hematuria.  Musculoskeletal: Negative for back pain and gait problem.  Skin: Negative for color change.  Neurological: Negative for dizziness and headaches.  Hematological: Negative for adenopathy.  Psychiatric/Behavioral: Negative for behavioral problems.  All other systems reviewed and are negative.    Allergies  Codeine and Sulfonamide derivatives  Home Medications   Current Outpatient Rx  Name Route Sig Dispense Refill  . METOPROLOL SUCCINATE ER 25 MG PO TB24 Oral Take 25 mg by mouth 2 (two) times daily.    . ASPIRIN 81 MG PO CHEW Oral Chew 81 mg by mouth daily.    [name] CLONAZEPAM 1 MG PO TABS Oral Take 1-2 mg by mouth 2 (two) times daily. 1mg  qam and 2 mg qhs    . DOCUSATE SODIUM 100 MG PO CAPS Oral Take 300 mg by mouth daily.     . DULOXETINE HCL 60 MG PO CPEP Oral Take 60 mg by mouth daily.    [name] EXENATIDE 10 MCG/0.04ML [place] SOLN Subcutaneous Inject 10 mcg into the skin 2  (two) times daily with a meal.      . GUAIFENESIN-CODEINE 100-10 MG/5ML PO SYRP Oral Take 5 mLs by mouth 3 (three) times daily as needed. For cough    . INSULIN GLARGINE 100 UNIT/ML [place] SOLN Subcutaneous Inject 30 Units into the skin at bedtime.     . ISOSORBIDE MONONITRATE ER 60 MG PO TB24 Oral Take 60 mg by mouth daily.    [name] BACID PO TABS Oral Take 1 tablet by mouth daily.    [name] LISINOPRIL 10 MG PO TABS Oral Take 10 mg by mouth daily.    [name] LORAZEPAM 1 MG PO TABS Oral Take 3 mg by mouth at bedtime as needed. For sleep    . METFORMIN HCL 850 MG PO TABS Oral Take 850 mg by mouth 3 (three) times daily.      [name] NITROGLYCERIN 0.4 MG SL SUBL Sublingual Place 0.4 mg under the tongue every 5 (five) minutes as needed.      . NYSTATIN 100000 UNIT/GM EX POWD Topical Apply 1 g topically 3 (three) times daily. To affected areas/folds    . NYSTATIN 100000 UNIT/GM EX CREA Topical Apply 1 application topically 3 (three) times daily.    . OXYCODONE-ACETAMINOPHEN 10-325 MG PO TABS Oral Take 1 tablet by mouth 2 (two) times daily as needed. For severe pain    . PANTOPRAZOLE SODIUM 40 MG PO TBEC Oral Take 40 mg by mouth 2 (two) times daily.    [name] PRASUGREL HCL 10 MG PO TABS Oral Take 10 mg by mouth daily.      [name] PRAVASTATIN SODIUM 20 MG PO TABS Oral Take 20 mg by mouth daily.      . TRIAMCINOLONE ACETONIDE 0.5 % EX OINT Topical Apply topically 2 (two) times daily.        BP 104/77  Pulse 90  Temp 97.6 F (36.4 C) (Oral)  Resp 14  SpO2 100%  Physical Exam  Nursing note and vitals reviewed. Constitutional: She is oriented to person, place, and time. She appears well-developed and well-nourished.  HENT:  Head: Normocephalic.  Mouth/Throat: Oropharynx is clear and moist. No oropharyngeal exudate.  Eyes: Conjunctivae normal and EOM are normal. Pupils are equal, round, and reactive to light.  Neck: Normal range of motion. Neck supple.  Cardiovascular: Regular rhythm, normal heart sounds and intact distal pulses.   Exam reveals no gallop and no friction rub.   No murmur heard.      Sinus tachycardia, HR 104  Pulmonary/Chest: Effort normal and breath sounds normal. No respiratory distress. She has no wheezes.  Abdominal: Soft. Bowel sounds are normal. There is no tenderness. There is no rebound and  no guarding.  Musculoskeletal: Normal range of motion. She exhibits no edema and no tenderness (No ttp of bilateral LE's. ).  Neurological: She is alert and oriented to person, place, and time.  Skin: Skin is warm. She is diaphoretic (mild diaphoresis).  Psychiatric: She has a normal mood and affect. Her behavior is normal.    ED Course  Procedures (including critical care time)  Labs Reviewed  CBC WITH DIFFERENTIAL - Abnormal; Notable for the following:    WBC 14.7 (*)     RBC 3.62 (*)     Hemoglobin 9.8 (*)     HCT 31.1 (*)     Neutro Abs 10.9 (*)     All other components within normal limits  COMPREHENSIVE METABOLIC PANEL - Abnormal; Notable for the following:    Sodium 133 (*)     Glucose, Bld 266 (*)     Creatinine, Ser 1.47 (*)     Albumin 3.2 (*)     GFR calc non Af Amer 34 (*)     GFR calc Af Amer 39 (*)     All other components within normal limits  LACTIC ACID, PLASMA - Abnormal; Notable for the following:    Lactic Acid, Venous 2.6 (*)     All other components within normal limits  TROPONIN I - Abnormal; Notable for the following:    Troponin I 1.14 (*)     All other components within normal limits  LIPASE, BLOOD  URINALYSIS, ROUTINE W REFLEX MICROSCOPIC  URINE CULTURE   Ct Abdomen Pelvis W Contrast  01/25/2012  *RADIOLOGY REPORT*  Clinical Data: Epigastric pain.  Nausea and vomiting.  CT ABDOMEN AND PELVIS WITH CONTRAST  Technique:  Multidetector CT imaging of the abdomen and pelvis was performed following the standard protocol during bolus administration of intravenous contrast.  Contrast: 42mL OMNIPAQUE IOHEXOL 300 MG/ML  SOLN  Comparison: CT abdomen and pelvis 05/28/2010.   Findings: The lung bases are clear.  No pleural or pericardial effusion.  Coronary artery stents are noted.  There is a small hiatal hernia with postoperative change seen at the gastroesophageal junction.  As on the prior study, gallstones are identified measuring up to 2 cm in diameter.  No CT evidence of cholecystitis.  The liver is low attenuating consistent with fatty infiltration.  A small capsular surface calcification is unchanged.  No intraparenchymal lesion is identified.  No biliary ductal dilatation.  The spleen, adrenal glands and pancreas are unremarkable.  Bilateral renal cysts are unchanged.  The patient is status post prior ventral hernia repair and gastric bypass.  Surgical anastomosis in the distal ileum in the right lower quadrant is again seen.  Small bowel is otherwise unremarkable.  The colon appears normal.  The appendix has been removed.  There is no lymphadenopathy or fluid.  Uterus, adnexa and urinary bladder all appear normal.  There is no focal bony abnormality with multilevel lumbar degenerative change identified.  IMPRESSION:  1.  No acute finding or finding to explain the patient's symptoms. 2.  Postoperative change as described. 3.  Gallstones without evidence of cholecystitis. 4.  Fatty infiltration of the liver.   Original Report Authenticated By: Arvid Right. Luther Parody, M.D.    Dg Chest Port 1 View  01/25/2012  *RADIOLOGY REPORT*  Clinical Data: Shortness of breath.  Chest pain.  PORTABLE CHEST - 1 VIEW  Comparison: One view chest 12/31/2011.  Findings: Low lung volumes exaggerate the heart size.  There is no edema or effusion to suggest  failure.  No focal airspace disease evident.  The visualized soft tissues and bony thorax are unremarkable.  IMPRESSION:  1.  Low lung volumes. 2.  No acute cardiopulmonary disease.   Original Report Authenticated By: Resa Miner. MATTERN, M.D.      1. Hypotension   2. Abdominal pain   3. Elevated troponin   4. Chest pain      Date:  01/25/2012  Rate: 98  Rhythm: normal sinus rhythm  QRS Axis: normal  Intervals: normal  ST/T Wave abnormalities: normal  Conduction Disutrbances:none  Narrative Interpretation: Incomplete RBBB, no ST or T wave changes cw ischemia  Old EKG Reviewed: none available    MDM  12:21 AM 77 y.o. female w hx of DM, HTN, CAD s/p PCI on effient, cholelithiasis pw epig abd pain x 3 days. Pt notes this evening she was getting up to use the bathroom when she became suddenly diaphoretic. Pt presented to ER, upon arrival developed chest pressure 8/10 and mild sob. Reviewed previous CTA chest and CT abdomen which show no hx of AAA or aortic abnormality. Pts BP is 70/50's in room. Will get IVF, screening labs. Abd soft, benign at this time.   CT abd neg. Trop elevated. BP has improved w/ IVF. Consulted cards for eval. Will admit to hospitalist.   Clinical Impression 1. Hypotension   2. Abdominal pain   3. Elevated troponin   4. Chest pain          Pamella Pert, MD 01/26/12 0021

## 2012-01-25 NOTE — ED Notes (Signed)
Pt. To CT

## 2012-01-25 NOTE — ED Notes (Signed)
Critical lab value troponin 1.14 reported to Dr. Christy Gentles.

## 2012-01-25 NOTE — ED Notes (Addendum)
MD at bedside. (Dr. Aline Brochure)

## 2012-01-25 NOTE — Consult Note (Signed)
CARDIOLOGY CONSULT NOTE  Patient ID: [name] MRN: [id number] DOB/AGE: 1934/06/24 77 y.o.  Admit date: 01/25/2012 Primary Cardiologist: [name] Reason for Consultation: Elevated troponin  HPI: 77 yo with history of CAD most recently with DES to RCA in 2/12, morbid obesity, COPD, DM presented to ER with abdominal pain and diaphoresis.  At baseline, patient is minimally mobile and has to use a wheelchair due to knee arthritis and weight.  For 3-4 days, she has had peri-umbilical abdominal pain.  She has had nausea.  No vomiting or diarrhea.  Today, she was sitting on the commode and began to sweat and feel lightheaded.  She did not pass out.  Immediately afterwards, her abdominal pain worsened.  She decided to come to the ER.  In the ER, for the first time, she developed chest pressure.  This lasted about 10 minutes and resolved spontaneously.  She still has some peri-umbilical soreness but overall feels better.  While in the ER, her BP was noted to be in the 70s/50s and she was symptomatically lightheaded.  SBP is now > 100 with IV fluid bolus.  Patient reports increased dyspnea over the last few days as well.  She is more short of breath than usual with transfers.  She was short of breath when I had her sit up.   In the ER, she had an abdominal CT that showed gallstones without evidence for acute cholecystitis.  No other significant finding.  CXR was unremarkable.  ECG showed NSR with iRBBB.  The iRBBB appears to be new compared to prior ECG.  She is currently tachycardic with HR in the 100s.  Troponin was drawn and was found to be elevated.   Review of systems complete and found to be negative unless listed above in HPI  Past Medical History: 1. COPD (chronic obstructive pulmonary disease)  2. Diabetes mellitus  3. Diverticular disease  4. Hypertension   5. Hyperlipidemia   6. Coronary artery disease: s/p IMI 2004 tx with BMS to RCA; myoview 1/07: EF 49%, inf scar, no isch;  s/p Promus  DES to RCA 2/12 (cath: LM 20-30%, pLAD 20-30%, mLAD 50%, RI 30%, pRCA 60%, mRCA 90% - tx with PCI).  Echo (6/12): EF 60%, mild LVH.  7. Urinary incontinence   8. Insomnia  9. Osteoarthritis  10. Obstructive sleep apnea  11. Polymyalgia rheumatica  12. Depression  13. Cholelithiasis  14. Chronic abdominal pain 15. ABIs (1/13): No evidence for significant PAD.  16. Morbid obesity: Confined to wheelchair  Family History  Problem Relation Age of Onset  . Breast cancer Mother   . Heart disease Mother   . Throat cancer Father   . Hypertension Father   . Arthritis Father   . Diabetes Father   . Arthritis Sister   . Obesity Sister     History   Social History  . Marital Status: Legally Separated    Spouse Name: N/A    Number of Children: N/A  . Years of Education: N/A   Occupational History  . Not on file.   Social History Main Topics  . Smoking status: Never Smoker   . Smokeless tobacco: Not on file  . Alcohol Use: No  . Drug Use: No  . Sexually Active:    Other Topics Concern  . Not on file   Social History Narrative  . No narrative on file    Current Facility-Administered Medications  Medication Dose Route Frequency Provider Last Rate Last Dose  .  aspirin chewable tablet 324 mg  324 mg Oral Once Pamella Pert, MD      . iohexol (OMNIPAQUE) 300 MG/ML solution 20 mL  20 mL Oral Q1 Hr x 2 Medication Radiologist, MD   20 mL at 01/25/12 2055  . iohexol (OMNIPAQUE) 300 MG/ML solution 80 mL  80 mL Intravenous Once PRN Medication Radiologist, MD   80 mL at 01/25/12 2216  . LORazepam (ATIVAN) injection 0.5 mg  0.5 mg Intravenous Once Pamella Pert, MD   0.5 mg at 01/25/12 2148  . sodium chloride 0.9 % bolus 1,000 mL  1,000 mL Intravenous STAT Pamella Pert, MD   1,000 mL at 01/25/12 1942  . sodium chloride 0.9 % bolus 1,000 mL  1,000 mL Intravenous Once Sharyon Cable, MD   1,000 mL at 01/25/12 1944  . DISCONTD: LORazepam (ATIVAN) injection 0.5 mg  0.5 mg  Intramuscular Once Pamella Pert, MD       Current Outpatient Prescriptions  Medication Sig Dispense Refill  . metoprolol succinate (TOPROL-XL) 25 MG 24 hr tablet Take 25 mg by mouth 2 (two) times daily.      [name] aspirin 81 MG chewable tablet Chew 81 mg by mouth daily.      . clonazePAM (KLONOPIN) 1 MG tablet Take 1-2 mg by mouth 2 (two) times daily. 1mg  qam and 2 mg qhs      . docusate sodium (COLACE) 100 MG capsule Take 300 mg by mouth daily.       . DULoxetine (CYMBALTA) 60 MG capsule Take 60 mg by mouth daily.      [name] exenatide (BYETTA 10 MCG PEN) 10 MCG/0.04ML SOLN Inject 10 mcg into the skin 2 (two) times daily with a meal.        . guaiFENesin-codeine (ROBITUSSIN AC) 100-10 MG/5ML syrup Take 5 mLs by mouth 3 (three) times daily as needed. For cough      . insulin glargine (LANTUS) 100 UNIT/ML injection Inject 30 Units into the skin at bedtime.       . isosorbide mononitrate (IMDUR) 60 MG 24 hr tablet Take 60 mg by mouth daily.      [name] lactobacillus acidophilus (BACID) TABS Take 1 tablet by mouth daily.      [name] lisinopril (PRINIVIL,ZESTRIL) 10 MG tablet Take 10 mg by mouth daily.      [name] LORazepam (ATIVAN) 1 MG tablet Take 3 mg by mouth at bedtime as needed. For sleep      . metFORMIN (GLUCOPHAGE) 850 MG tablet Take 850 mg by mouth 3 (three) times daily.        . nitroGLYCERIN (NITROSTAT) 0.4 MG SL tablet Place 0.4 mg under the tongue every 5 (five) minutes as needed.        . nystatin (MYCOSTATIN/NYSTOP) 100000 UNIT/GM POWD Apply 1 g topically 3 (three) times daily. To affected areas/folds      . nystatin cream (MYCOSTATIN) Apply 1 application topically 3 (three) times daily.      [name] oxyCODONE-acetaminophen (PERCOCET) 10-325 MG per tablet Take 1 tablet by mouth 2 (two) times daily as needed. For severe pain      . pantoprazole (PROTONIX) 40 MG tablet Take 40 mg by mouth 2 (two) times daily.      . prasugrel (EFFIENT) 10 MG TABS Take 10 mg by mouth daily.        . pravastatin (PRAVACHOL) 20  MG tablet Take 20 mg by mouth daily.        [name] triamcinolone (KENALOG) 0.5 %  ointment Apply topically 2 (two) times daily.           Physical exam Blood pressure 132/84, pulse 101, temperature 97.6 F (36.4 C), temperature source Oral, resp. rate 14, SpO2 100.00%. General: NAD, morbidly obese.  Neck: JVP 12-14 cm, no thyromegaly or thyroid nodule.  Lungs: Clear to auscultation bilaterally with normal respiratory effort. CV: Nondisplaced PMI.  Heart regular S1/S2, no S3/S4, no murmur.  No peripheral edema.  No carotid bruit.  Normal pedal pulses.  Abdomen: Soft, tender just superior to umbilicus without rebound or guarding, no hepatosplenomegaly, no distention.  Skin: Intact without lesions or rashes.  Neurologic: Alert and oriented x 3.  Psych: Normal affect. Extremities: No clubbing or cyanosis.  HEENT: Normal.   Labs:   Lab Results  Component Value Date   WBC 14.7* 01/25/2012   HGB 9.8* 01/25/2012   HCT 31.1* 01/25/2012   MCV 85.9 01/25/2012   PLT 284 01/25/2012    Lab 01/25/12 1921  NA 133*  K 5.1  CL 97  CO2 22  BUN 19  CREATININE 1.47*  CALCIUM 9.2  PROT 7.5  BILITOT 0.3  ALKPHOS 80  ALT 21  AST 24  GLUCOSE 266*   Lactate 2.6 (elevated)    Radiology: 1. CT abdomen: No acute findings, gallstones in gallbladder without e/o cholecystitis. 2. CXR: No acute changes.  EKG: NSR, incomplete RBBB (new iRBBB compared to prior).   ASSESSMENT AND PLAN:  1. Abdominal pain: Patient has had some chronic abdominal pain.  She has mild tenderness just above the umbilicus.  Abdominal CT did not show an acute abnormality.  It is possible that she has an abdominal process that is causing the pain and caused the hypotension.  Her lactate was mildly elevated.  I will order a procalcitonin to see if this is elevated suggesting a septic abdominal process that we missed on CT.   2. Elevated troponin: Unusual presentation for ACS.  Abdominal pain was most prominent symptom.  However, she is  noted to have elevated JVP and has been more short of breath recently.  She had an episode of chest pain but not until she arrived in the ER.  She has a new incomplete right bundle branch block.  My greatest suspicion at this time is for PE.  This could explain the dyspnea, elevated JVP, and new iRBBB.  She is at risk for PE given morbid obesity and immobility.   - She has had a CT of the abdomen with contrast already.  She will need a V/Q scan.  - In the meantime, I would treat her with heparin gtt - Cycle cardiac enzymes to peak including CKMB, and check a BNP.  - Echocardiogram 3. CKD: Mildly elevated creatinine will need to be followed.   4. Elevated JVP: This could be due to PE, alternatively it could be due to LV systolic or diastolic dysfunction.   I would not diurese her aggressively until we see if she has a PE.   5. CAD: Mrs Sendejas has known CAD.  Continue her home ASA 81, Effient, and statin.  Would hold off on beta blocker and ACEI until we see that her BP will be stable.   6. Hypotensive episode: Resolved with IV fluid.  A large PE could certainly explain this.   [name] 01/26/2012 01/25/2012, 11:53 PM

## 2012-01-25 NOTE — ED Provider Notes (Signed)
Pt seen with resident Patient with epigastric abdominal pain and hypotension.   IV fluids ordered May need ct imaging of abdomen to rule out acute process Will follow closely  Sharyon Cable, MD 01/25/12 [id number]

## 2012-01-25 NOTE — ED Notes (Signed)
Pt complains of abdominal pain for 3 days, today experienced chest pressure, weakness, and diaphoretic

## 2012-01-26 ENCOUNTER — Inpatient Hospital Stay (HOSPITAL_COMMUNITY): Payer: Medicare Other

## 2012-01-26 ENCOUNTER — Encounter (HOSPITAL_COMMUNITY): Payer: Self-pay | Admitting: Internal Medicine

## 2012-01-26 DIAGNOSIS — R109 Unspecified abdominal pain: Secondary | ICD-10-CM

## 2012-01-26 DIAGNOSIS — I959 Hypotension, unspecified: Secondary | ICD-10-CM | POA: Diagnosis present

## 2012-01-26 DIAGNOSIS — R079 Chest pain, unspecified: Secondary | ICD-10-CM | POA: Diagnosis present

## 2012-01-26 DIAGNOSIS — R778 Other specified abnormalities of plasma proteins: Secondary | ICD-10-CM | POA: Diagnosis present

## 2012-01-26 DIAGNOSIS — I2699 Other pulmonary embolism without acute cor pulmonale: Secondary | ICD-10-CM

## 2012-01-26 DIAGNOSIS — I517 Cardiomegaly: Secondary | ICD-10-CM

## 2012-01-26 HISTORY — DX: Unspecified abdominal pain: R10.9

## 2012-01-26 LAB — GLUCOSE, CAPILLARY
Glucose-Capillary: 123 mg/dL — ABNORMAL HIGH (ref 70–99)
Glucose-Capillary: 129 mg/dL — ABNORMAL HIGH (ref 70–99)
Glucose-Capillary: 193 mg/dL — ABNORMAL HIGH (ref 70–99)
Glucose-Capillary: 226 mg/dL — ABNORMAL HIGH (ref 70–99)
Glucose-Capillary: 228 mg/dL — ABNORMAL HIGH (ref 70–99)

## 2012-01-26 LAB — URINALYSIS, ROUTINE W REFLEX MICROSCOPIC
Bilirubin Urine: NEGATIVE
Bilirubin Urine: NEGATIVE
Glucose, UA: NEGATIVE mg/dL
Glucose, UA: NEGATIVE mg/dL
Hgb urine dipstick: NEGATIVE
Hgb urine dipstick: NEGATIVE
Ketones, ur: NEGATIVE mg/dL
Ketones, ur: NEGATIVE mg/dL
Leukocytes, UA: NEGATIVE
Leukocytes, UA: NEGATIVE
Nitrite: NEGATIVE
Nitrite: NEGATIVE
Protein, ur: NEGATIVE mg/dL
Protein, ur: NEGATIVE mg/dL
Specific Gravity, Urine: 1.024 (ref 1.005–1.030)
Specific Gravity, Urine: 1.008 (ref 1.005–1.030)
Urobilinogen, UA: 0.2 mg/dL (ref 0.0–1.0)
Urobilinogen, UA: 0.2 mg/dL (ref 0.0–1.0)
pH: 5.5 (ref 5.0–8.0)
pH: 5.5 (ref 5.0–8.0)

## 2012-01-26 LAB — BASIC METABOLIC PANEL
BUN: 20 mg/dL (ref 6–23)
CO2: 20 mEq/L (ref 19–32)
Calcium: 8.5 mg/dL (ref 8.4–10.5)
Chloride: 100 mEq/L (ref 96–112)
Creatinine, Ser: 1.25 mg/dL — ABNORMAL HIGH (ref 0.50–1.10)
GFR calc Af Amer: 48 mL/min — ABNORMAL LOW (ref >90)
GFR calc non Af Amer: 41 mL/min — ABNORMAL LOW (ref >90)
Glucose, Bld: 214 mg/dL — ABNORMAL HIGH (ref 70–99)
Potassium: 5.2 mEq/L — ABNORMAL HIGH (ref 3.5–5.1)
Sodium: 133 mEq/L — ABNORMAL LOW (ref 135–145)

## 2012-01-26 LAB — PRO B NATRIURETIC PEPTIDE: Pro B Natriuretic peptide (BNP): 382.5 pg/mL (ref 0–450)

## 2012-01-26 LAB — CBC
HCT: 28.2 % — ABNORMAL LOW (ref 36.0–46.0)
Hemoglobin: 9.1 g/dL — ABNORMAL LOW (ref 12.0–15.0)
MCH: 27.5 pg (ref 26.0–34.0)
MCHC: 32.3 g/dL (ref 30.0–36.0)
MCV: 85.2 fL (ref 78.0–100.0)
Platelets: 263 10*3/uL (ref 150–400)
RBC: 3.31 MIL/uL — ABNORMAL LOW (ref 3.87–5.11)
RDW: 15.2 % (ref 11.5–15.5)
WBC: 15.5 10*3/uL — ABNORMAL HIGH (ref 4.0–10.5)

## 2012-01-26 LAB — TROPONIN I
Troponin I: 1.34 ng/mL (ref <0.30)
Troponin I: 2.6 ng/mL (ref <0.30)
Troponin I: 2.38 ng/mL (ref <0.30)
Troponin I: 1.05 ng/mL (ref <0.30)

## 2012-01-26 LAB — PROTIME-INR
INR: 1.13 (ref 0.00–1.49)
Prothrombin Time: 14.3 seconds (ref 11.6–15.2)

## 2012-01-26 LAB — PROCALCITONIN: Procalcitonin: 0.13 ng/mL

## 2012-01-26 LAB — CK TOTAL AND CKMB (NOT AT ARMC)
CK, MB: 8.2 ng/mL (ref 0.3–4.0)
Relative Index: 7.1 — ABNORMAL HIGH (ref 0.0–2.5)
Total CK: 116 U/L (ref 7–177)

## 2012-01-26 LAB — HEPARIN LEVEL (UNFRACTIONATED)
Heparin Unfractionated: 0.35 IU/mL (ref 0.30–0.70)
Heparin Unfractionated: 0.37 IU/mL (ref 0.30–0.70)

## 2012-01-26 LAB — D-DIMER, QUANTITATIVE (NOT AT ARMC): D-Dimer, Quant: 14.45 ug/mL-FEU — ABNORMAL HIGH (ref 0.00–0.48)

## 2012-01-26 LAB — MRSA PCR SCREENING: MRSA by PCR: NEGATIVE

## 2012-01-26 LAB — TSH: TSH: 0.785 u[IU]/mL (ref 0.350–4.500)

## 2012-01-26 MED ORDER — EXENATIDE 10 MCG/0.04ML ~~LOC~~ SOPN
10.0000 ug | PEN_INJECTOR | Freq: Two times a day (BID) | SUBCUTANEOUS | Status: DC
  Administered 2012-01-27 – 2012-02-01 (×11): 10 ug via SUBCUTANEOUS

## 2012-01-26 MED ORDER — HEPARIN BOLUS VIA INFUSION
5000.0000 [IU] | Freq: Once | INTRAVENOUS | Status: AC
  Administered 2012-01-26: 5000 [IU] via INTRAVENOUS

## 2012-01-26 MED ORDER — SIMVASTATIN 10 MG PO TABS
10.0000 mg | ORAL_TABLET | Freq: Every day | ORAL | Status: DC
  Administered 2012-01-26 – 2012-01-31 (×6): 10 mg via ORAL
  Filled 2012-01-26 (×7): qty 1

## 2012-01-26 MED ORDER — BACID PO TABS
1.0000 | ORAL_TABLET | Freq: Every day | ORAL | Status: DC
  Administered 2012-01-26 – 2012-02-01 (×7): 1 via ORAL
  Filled 2012-01-26 (×7): qty 1

## 2012-01-26 MED ORDER — NYSTATIN 100000 UNIT/GM EX POWD
1.0000 g | Freq: Three times a day (TID) | CUTANEOUS | Status: DC
  Administered 2012-01-27 – 2012-01-31 (×8): 1 g via TOPICAL
  Filled 2012-01-26: qty 15

## 2012-01-26 MED ORDER — ISOSORBIDE MONONITRATE ER 60 MG PO TB24
60.0000 mg | ORAL_TABLET | Freq: Every day | ORAL | Status: DC
  Administered 2012-01-26 – 2012-01-31 (×6): 60 mg via ORAL
  Filled 2012-01-26 (×6): qty 1

## 2012-01-26 MED ORDER — HEPARIN (PORCINE) IN NACL 100-0.45 UNIT/ML-% IJ SOLN
2000.0000 [IU]/h | INTRAMUSCULAR | Status: DC
  Administered 2012-01-26 – 2012-01-28 (×3): 1700 [IU]/h via INTRAVENOUS
  Administered 2012-01-29: 2000 [IU]/h via INTRAVENOUS
  Administered 2012-01-29: 1700 [IU]/h via INTRAVENOUS
  Administered 2012-01-29: 2000 [IU]/h via INTRAVENOUS
  Filled 2012-01-26 (×11): qty 250

## 2012-01-26 MED ORDER — SODIUM CHLORIDE 0.9 % IJ SOLN: 3.0000 mL | Freq: Two times a day (BID) | INTRAMUSCULAR | Status: DC

## 2012-01-26 MED ORDER — OXYCODONE-ACETAMINOPHEN 5-325 MG PO TABS
1.0000 | ORAL_TABLET | ORAL | Status: DC | PRN
  Administered 2012-01-29 – 2012-01-31 (×6): 1 via ORAL
  Filled 2012-01-26 (×6): qty 1

## 2012-01-26 MED ORDER — INSULIN ASPART 100 UNIT/ML ~~LOC~~ SOLN
0.0000 [IU] | Freq: Three times a day (TID) | SUBCUTANEOUS | Status: DC
  Administered 2012-01-27 (×2): 3 [IU] via SUBCUTANEOUS

## 2012-01-26 MED ORDER — EXENATIDE 10 MCG/0.04ML ~~LOC~~ SOPN
10.0000 ug | PEN_INJECTOR | Freq: Two times a day (BID) | SUBCUTANEOUS | Status: DC
  Administered 2012-01-26: 10 ug via SUBCUTANEOUS
  Filled 2012-01-26: qty 2.4

## 2012-01-26 MED ORDER — TECHNETIUM TO 99M ALBUMIN AGGREGATED
3.0000 | Freq: Once | INTRAVENOUS | Status: AC | PRN
  Administered 2012-01-26: 3 via INTRAVENOUS

## 2012-01-26 MED ORDER — INSULIN GLARGINE 100 UNIT/ML ~~LOC~~ SOLN
10.0000 [IU] | Freq: Every day | SUBCUTANEOUS | Status: DC
  Administered 2012-01-26 (×2): 10 [IU] via SUBCUTANEOUS

## 2012-01-26 MED ORDER — SODIUM CHLORIDE 0.9 % IV SOLN: 250.0000 mL | INTRAVENOUS | Status: DC | PRN

## 2012-01-26 MED ORDER — NYSTATIN 100000 UNIT/GM EX CREA
1.0000 "application " | TOPICAL_CREAM | Freq: Three times a day (TID) | CUTANEOUS | Status: DC
  Administered 2012-01-27 – 2012-01-31 (×7): 1 via TOPICAL
  Filled 2012-01-26: qty 15

## 2012-01-26 MED ORDER — METOPROLOL SUCCINATE 12.5 MG HALF TABLET
12.5000 mg | ORAL_TABLET | Freq: Every day | ORAL | Status: DC
  Administered 2012-01-26 – 2012-02-01 (×6): 12.5 mg via ORAL
  Filled 2012-01-26 (×7): qty 1

## 2012-01-26 MED ORDER — ONDANSETRON HCL 4 MG/2ML IJ SOLN: 4.0000 mg | Freq: Four times a day (QID) | INTRAMUSCULAR | Status: DC | PRN

## 2012-01-26 MED ORDER — XENON XE 133 GAS
17.0000 | GAS_FOR_INHALATION | Freq: Once | RESPIRATORY_TRACT | Status: AC | PRN
  Administered 2012-01-26: 17 via RESPIRATORY_TRACT

## 2012-01-26 MED ORDER — ASPIRIN 81 MG PO CHEW
81.0000 mg | CHEWABLE_TABLET | Freq: Every day | ORAL | Status: DC
  Administered 2012-01-26 – 2012-02-01 (×7): 81 mg via ORAL
  Filled 2012-01-26 (×7): qty 1

## 2012-01-26 MED ORDER — PANTOPRAZOLE SODIUM 40 MG PO TBEC
40.0000 mg | DELAYED_RELEASE_TABLET | Freq: Two times a day (BID) | ORAL | Status: DC
  Administered 2012-01-26 – 2012-02-01 (×13): 40 mg via ORAL
  Filled 2012-01-26 (×11): qty 1

## 2012-01-26 MED ORDER — ONDANSETRON HCL 4 MG PO TABS: 4.0000 mg | ORAL_TABLET | Freq: Four times a day (QID) | ORAL | Status: DC | PRN

## 2012-01-26 MED ORDER — DOCUSATE SODIUM 100 MG PO CAPS
300.0000 mg | ORAL_CAPSULE | Freq: Every day | ORAL | Status: DC
  Administered 2012-01-26 – 2012-02-01 (×7): 300 mg via ORAL
  Filled 2012-01-26 (×7): qty 3

## 2012-01-26 MED ORDER — INSULIN ASPART 100 UNIT/ML ~~LOC~~ SOLN
0.0000 [IU] | Freq: Every day | SUBCUTANEOUS | Status: DC
  Administered 2012-01-26: 2 [IU] via SUBCUTANEOUS

## 2012-01-26 MED ORDER — DULOXETINE HCL 60 MG PO CPEP
60.0000 mg | ORAL_CAPSULE | Freq: Every day | ORAL | Status: DC
  Administered 2012-01-27: 60 mg via ORAL
  Filled 2012-01-26 (×2): qty 1

## 2012-01-26 MED ORDER — INSULIN ASPART 100 UNIT/ML ~~LOC~~ SOLN
0.0000 [IU] | SUBCUTANEOUS | Status: DC
  Administered 2012-01-26: 3 [IU] via SUBCUTANEOUS
  Administered 2012-01-26: 2 [IU] via SUBCUTANEOUS

## 2012-01-26 MED ORDER — SODIUM CHLORIDE 0.9 % IJ SOLN: 3.0000 mL | INTRAMUSCULAR | Status: DC | PRN

## 2012-01-26 MED ORDER — PRASUGREL HCL 10 MG PO TABS
10.0000 mg | ORAL_TABLET | Freq: Every day | ORAL | Status: DC
  Administered 2012-01-26 – 2012-01-27 (×2): 10 mg via ORAL
  Filled 2012-01-26 (×2): qty 1

## 2012-01-26 MED ORDER — OXYCODONE-ACETAMINOPHEN 10-325 MG PO TABS: 1.0000 | ORAL_TABLET | ORAL | Status: DC | PRN

## 2012-01-26 MED ORDER — OXYCODONE HCL 5 MG PO TABS
5.0000 mg | ORAL_TABLET | ORAL | Status: DC | PRN
  Administered 2012-01-26 – 2012-02-01 (×14): 5 mg via ORAL
  Filled 2012-01-26 (×14): qty 1

## 2012-01-26 NOTE — ED Notes (Signed)
Admitting physician at bedside

## 2012-01-26 NOTE — H&P (Signed)
[facility] History and Physical  [name] [id number] DOB: 09-17-1934    PCP:   Sherian Maroon, MD   Chief Complaint: abdominal pain.  HPI: [name] is an 77 y.o. female with known CAD s/p DES last year, on Effient,, HTN, Hyperlipidemia, DM, COPD, s/p gastric bypass, known cholelithiasis, presents to the ER originally with epigastric pain, then had transcient chest pain in the ER, with diaphoresis, lightheadedness, but no pleuritic CP and SOB.  Evaluation in the ER showed new RBBB, normal lipase, lactic acid of 2.6, normal LFTs, Cr of 1.47, and elevated troponin to 1.1.  She was found to be hypotensive with SBP of 70 (although the BP cuff was very small and was placed on her right forearm).  She was given IVF and BP came up to 100.  Her Abdominal CT showed cholelithiasis, no acute cholecystitis, and no acute process.  Cardiologist Dr Marigene Ehlers has seen her in consultation and hospitalist was asked to admit her for further work up.  Rewiew of Systems:  Constitutional: Negative for malaise, fever and chills. No significant weight loss or weight gain Eyes: Negative for eye pain, redness and discharge, diplopia, visual changes, or flashes of light. ENMT: Negative for ear pain, hoarseness, nasal congestion, sinus pressure and sore throat. No headaches; tinnitus, drooling, or problem swallowing. Cardiovascular: Negative for chest pain, palpitations, diaphoresis, dyspnea and peripheral edema. ; No orthopnea, PND Respiratory: Negative for cough, hemoptysis, wheezing and stridor. No pleuritic chestpain. Gastrointestinal: Negative abdominal pain, melena, blood in stool, hematemesis, jaundice and rectal bleeding.    Genitourinary: Negative for frequency, dysuria, incontinence,flank pain and hematuria; Musculoskeletal: Negative for back pain and neck pain. Negative for swelling and trauma.;  Skin: . Negative for pruritus, rash, abrasions, bruising and skin lesion.;  ulcerations Neuro: Negative for headache, lightheadedness and neck stiffness. Negative for weakness, altered level of consciousness , altered mental status, extremity weakness, burning feet, involuntary movement, seizure and syncope.  Psych: negative for  depression, insomnia, tearfulness, panic attacks, hallucinations, paranoia, suicidal or homicidal ideation    Past Medical History  Diagnosis Date  . COPD (chronic obstructive pulmonary disease)     patient denies this  . Diabetes mellitus   . Diverticular disease   . Hypertension   . Hyperlipidemia   . Coronary artery disease     a. s/p IMI 2004 tx with BMS to RCA;  b. s/p Promus DES to RCA 2/12 (cath: LM 20-30%, pLAD 20-30%, mLAD 50%, RI 30%, pRCA 60%, mRCA 90% - tx with PCI);  c. myoview 1/07: EF 49%, inf scar, no isch  . Urinary incontinence   . Insomnia   . Osteoarthritis   . Morbid obesity   . Obstructive sleep apnea   . Polymyalgia rheumatica   . Anxiety   . Arthritis   . Depression   . Cholelithiasis     Past Surgical History  Procedure Date  . Coronary artery bypass graft   . Appendectomy   . Cardiac catheterization   . Gastric bypass 1977    reversed in 1979    Medications:  HOME MEDS: Prior to Admission medications   Medication Sig Start Date End Date Taking? Authorizing Provider  metoprolol succinate (TOPROL-XL) 25 MG 24 hr tablet Take 25 mg by mouth 2 (two) times daily.   Yes Historical Provider, MD  aspirin 81 MG chewable tablet Chew 81 mg by mouth daily.    Historical Provider, MD  clonazePAM (KLONOPIN) 1 MG tablet Take 1-2 mg by mouth 2 (  two) times daily. 1mg  qam and 2 mg qhs    Historical Provider, MD  docusate sodium (COLACE) 100 MG capsule Take 300 mg by mouth daily.     Historical Provider, MD  DULoxetine (CYMBALTA) 60 MG capsule Take 60 mg by mouth daily.    Historical Provider, MD  exenatide (BYETTA 10 MCG PEN) 10 MCG/0.04ML SOLN Inject 10 mcg into the skin 2 (two) times daily with a meal.       Historical Provider, MD  guaiFENesin-codeine (ROBITUSSIN AC) 100-10 MG/5ML syrup Take 5 mLs by mouth 3 (three) times daily as needed. For cough    Historical Provider, MD  insulin glargine (LANTUS) 100 UNIT/ML injection Inject 30 Units into the skin at bedtime.     Historical Provider, MD  isosorbide mononitrate (IMDUR) 60 MG 24 hr tablet Take 60 mg by mouth daily.    Historical Provider, MD  lactobacillus acidophilus (BACID) TABS Take 1 tablet by mouth daily.    Historical Provider, MD  lisinopril (PRINIVIL,ZESTRIL) 10 MG tablet Take 10 mg by mouth daily.    Historical Provider, MD  LORazepam (ATIVAN) 1 MG tablet Take 3 mg by mouth at bedtime as needed. For sleep    Historical Provider, MD  metFORMIN (GLUCOPHAGE) 850 MG tablet Take 850 mg by mouth 3 (three) times daily.      Historical Provider, MD  nitroGLYCERIN (NITROSTAT) 0.4 MG SL tablet Place 0.4 mg under the tongue every 5 (five) minutes as needed.      Historical Provider, MD  nystatin (MYCOSTATIN/NYSTOP) 100000 UNIT/GM POWD Apply 1 g topically 3 (three) times daily. To affected areas/folds    Historical Provider, MD  nystatin cream (MYCOSTATIN) Apply 1 application topically 3 (three) times daily.    Historical Provider, MD  oxyCODONE-acetaminophen (PERCOCET) 10-325 MG per tablet Take 1 tablet by mouth 2 (two) times daily as needed. For severe pain    Historical Provider, MD  pantoprazole (PROTONIX) 40 MG tablet Take 40 mg by mouth 2 (two) times daily.    Historical Provider, MD  prasugrel (EFFIENT) 10 MG TABS Take 10 mg by mouth daily.      Renella Cunas, MD  pravastatin (PRAVACHOL) 20 MG tablet Take 20 mg by mouth daily.      Historical Provider, MD  triamcinolone (KENALOG) 0.5 % ointment Apply topically 2 (two) times daily.      Historical Provider, MD     Allergies:  Allergies  Allergen Reactions  . Codeine   . Sulfonamide Derivatives     Social History:   reports that she has never smoked. She does not have any smokeless  tobacco history on file. She reports that she does not drink alcohol or use illicit drugs.  Family History: Family History  Problem Relation Age of Onset  . Breast cancer Mother   . Heart disease Mother   . Throat cancer Father   . Hypertension Father   . Arthritis Father   . Diabetes Father   . Arthritis Sister   . Obesity Sister      Physical Exam: Filed Vitals:   01/25/12 2000 01/25/12 2253 01/26/12 0000 01/26/12 0100  BP: 102/69 132/84 104/77   Pulse: 101  90   Temp:      TempSrc:      Resp: 18 14    Height:    5' 6.93" (1.7 m)  Weight:    148.8 kg (328 lb 0.7 oz)  SpO2: 100% 100% 100%    Blood pressure 104/77, pulse  90, temperature 97.6 F (36.4 C), temperature source Oral, resp. rate 14, height 5' 6.93" (1.7 m), weight 148.8 kg (328 lb 0.7 oz), SpO2 100.00%.  GEN:  Pleasant  patient lying in the stretcher in no acute distress; cooperative with exam. PSYCH:  alert and oriented x4; doest appear anxious or depressed; affect is appropriate. HEENT: Mucous membranes pink and anicteric; PERRLA; EOM intact; no cervical lymphadenopathy nor thyromegaly or carotid bruit; no JVD; There were no stridor. Neck is very supple. Breasts:: Not examined CHEST WALL: No tenderness CHEST: Normal respiration, clear to auscultation bilaterally.  HEART: Regular rate and rhythm.  There are no murmur, rub, or gallops.   BACK: No kyphosis or scoliosis; no CVA tenderness ABDOMEN: soft and non-tender; no masses, no organomegaly, normal abdominal bowel sounds; no pannus; no intertriginous candida. There is no rebound and no distention. Rectal Exam: Not done EXTREMITIES: No bone or joint deformity; age-appropriate arthropathy of the hands and knees; no edema; no ulcerations.  There is no calf tenderness. Genitalia: not examined PULSES: 2+ and symmetric SKIN: Normal hydration no rash or ulceration CNS: Cranial nerves 2-12 grossly intact no focal lateralizing neurologic deficit.  Speech is fluent;  uvula elevated with phonation, facial symmetry and tongue midline. DTR are normal bilaterally, cerebella exam is intact, barbinski is negative and strengths are equaled bilaterally.  No sensory loss.   Labs on Admission:  Basic Metabolic Panel:  Lab [id number] 1921  NA 133*  K 5.1  CL 97  CO2 22  GLUCOSE 266*  BUN 19  CREATININE 1.47*  CALCIUM 9.2  MG --  PHOS --   Liver Function Tests:  Lab 01/25/12 1921  AST 24  ALT 21  ALKPHOS 80  BILITOT 0.3  PROT 7.5  ALBUMIN 3.2*    Lab 01/25/12 1921  LIPASE 52  AMYLASE --   No results found for this basename: AMMONIA:5 in the last 168 hours CBC:  Lab 01/25/12 1921  WBC 14.7*  NEUTROABS 10.9*  HGB 9.8*  HCT 31.1*  MCV 85.9  PLT 284   Cardiac Enzymes:  Lab 01/25/12 1921  CKTOTAL --  CKMB --  CKMBINDEX --  TROPONINI 1.14*    CBG:  Lab 01/26/12 0042  GLUCAP 226*     Radiological Exams on Admission: Ct Abdomen Pelvis W Contrast  01/25/2012  *RADIOLOGY REPORT*  Clinical Data: Epigastric pain.  Nausea and vomiting.  CT ABDOMEN AND PELVIS WITH CONTRAST  Technique:  Multidetector CT imaging of the abdomen and pelvis was performed following the standard protocol during bolus administration of intravenous contrast.  Contrast: 66mL OMNIPAQUE IOHEXOL 300 MG/ML  SOLN  Comparison: CT abdomen and pelvis 05/28/2010.  Findings: The lung bases are clear.  No pleural or pericardial effusion.  Coronary artery stents are noted.  There is a small hiatal hernia with postoperative change seen at the gastroesophageal junction.  As on the prior study, gallstones are identified measuring up to 2 cm in diameter.  No CT evidence of cholecystitis.  The liver is low attenuating consistent with fatty infiltration.  A small capsular surface calcification is unchanged.  No intraparenchymal lesion is identified.  No biliary ductal dilatation.  The spleen, adrenal glands and pancreas are unremarkable.  Bilateral renal cysts are unchanged.  The patient is  status post prior ventral hernia repair and gastric bypass.  Surgical anastomosis in the distal ileum in the right lower quadrant is again seen.  Small bowel is otherwise unremarkable.  The colon appears normal.  The appendix has been  removed.  There is no lymphadenopathy or fluid.  Uterus, adnexa and urinary bladder all appear normal.  There is no focal bony abnormality with multilevel lumbar degenerative change identified.  IMPRESSION:  1.  No acute finding or finding to explain the patient's symptoms. 2.  Postoperative change as described. 3.  Gallstones without evidence of cholecystitis. 4.  Fatty infiltration of the liver.   Original Report Authenticated By: Arvid Right. Luther Parody, M.D.    Dg Chest Port 1 View  01/25/2012  *RADIOLOGY REPORT*  Clinical Data: Shortness of breath.  Chest pain.  PORTABLE CHEST - 1 VIEW  Comparison: One view chest 12/31/2011.  Findings: Low lung volumes exaggerate the heart size.  There is no edema or effusion to suggest failure.  No focal airspace disease evident.  The visualized soft tissues and bony thorax are unremarkable.  IMPRESSION:  1.  Low lung volumes. 2.  No acute cardiopulmonary disease.   Original Report Authenticated By: Resa Miner. MATTERN, M.D.     EKG: Independently reviewed. New RBBB with no ischemic changes.   Assessment/Plan Present on Admission:  .Hypotension .Abdominal pain .Elevated troponin .Chest pain .ANEMIA .CHOLELITHIASIS .CORONARY ARTERY DISEASE .DM w/o Complication Type II .OBESITY, MORBID   PLAN:  Will admit her for further work up.  Her abdominal pain has improved and will follow, but CT was reassuring.  Dr Marigene Ehlers is concerned about a PE, and we will fully heparinize her both for elevated troponin, and until V/Q scan is done.  I note her anemia, but she had no evidence of active bleeding.  Her BP may indeed be higher than stated, but to be safe, will hold her antihypertensive meds.  For her DM, will use lower Lantus dose because  she has been placed on NPO, will hold her Metformin as her Cr is up, and she just received contrast dye.  She is conversing and appears to be in no apparent distress.  Will put her in SDU for closer monitoring.  She is a full Code and will be admitted to [facility] hospitalist.  I have not started her on any antibiotics, but will pan culture her.  Other plans as per orders.  Code Status: full code.   Orvan Falconer, MD. [facility] Pager [phone] 7pm to 7am.  01/26/2012, 1:16 AM

## 2012-01-26 NOTE — Progress Notes (Signed)
ANTICOAGULATION CONSULT NOTE - Initial Consult  Pharmacy Consult for heparin Indication: R/o pulmonary embolism  Allergies  Allergen Reactions  . Codeine   . Sulfonamide Derivatives     Patient Measurements: Height: 5' 6.93" (170 cm) Weight: 328 lb 0.7 oz (148.8 kg) (Per 12/15/11 documentation) IBW/kg (Calculated) : 61.44  Heparin Dosing Weight: 99 kg  Vital Signs: Temp: 97.6 F (36.4 C) (09/19 1834) Temp src: Oral (09/19 1834) BP: 104/77 mmHg (09/20 0000) Pulse Rate: 90  (09/20 0000)  Labs:  Basename 01/25/12 1921  HGB 9.8*  HCT 31.1*  PLT 284  APTT --  LABPROT --  INR --  HEPARINUNFRC --  CREATININE 1.47*  CKTOTAL --  CKMB --  TROPONINI 1.14*    Estimated Creatinine Clearance: 50.3 ml/min (by C-G formula based on Cr of 1.47).   Medical History: Past Medical History  Diagnosis Date  . COPD (chronic obstructive pulmonary disease)     patient denies this  . Diabetes mellitus   . Diverticular disease   . Hypertension   . Hyperlipidemia   . Coronary artery disease     a. s/p IMI 2004 tx with BMS to RCA;  b. s/p Promus DES to RCA 2/12 (cath: LM 20-30%, pLAD 20-30%, mLAD 50%, RI 30%, pRCA 60%, mRCA 90% - tx with PCI);  c. myoview 1/07: EF 49%, inf scar, no isch  . Urinary incontinence   . Insomnia   . Osteoarthritis   . Morbid obesity   . Obstructive sleep apnea   . Polymyalgia rheumatica   . Anxiety   . Arthritis   . Depression   . Cholelithiasis     Medications:  Scheduled:    . aspirin  324 mg Oral Once  . iohexol  20 mL Oral Q1 Hr x 2  . LORazepam  0.5 mg Intravenous Once  . sodium chloride  1,000 mL Intravenous STAT  . sodium chloride  1,000 mL Intravenous Once  . DISCONTD: LORazepam  0.5 mg Intramuscular Once    Assessment: 77 yo female admitted with elevated troponin and possible pulmonary embolus. Pharmacy consulted to manage heparin. Plan for VQ scan.   Goal of Therapy:  Heparin level 0.3-0.7 units/ml Monitor platelets by  anticoagulation protocol: Yes   Plan:  1. Heparin 5000 units IV bolus x 1, then IV infusion of 1700 units/hr.  2. Heparin level in 8 hours.  3. Daily CBC, heparin level.   Otila Back 01/26/2012,1:09 AM

## 2012-01-26 NOTE — Progress Notes (Signed)
[facility] Progress Note [place] TEAM 1 - Stepdown/ICU TEAM   AMBREA HARTENSTINE [id number] DOB: August 06, 1934 DOA: 01/25/2012 PCP: Sherian Maroon, MD  Brief narrative: 77 y.o. female with known CAD s/p DES last year on Effient, HTN, Hyperlipidemia, DM, COPD, s/p gastric bypass, known cholelithiasis, presented to the ER originally with epigastric pain, then had transient chest pain in the ER, with diaphoresis, lightheadedness, but no pleuritic CP or SOB. Evaluation in the ER showed new RBBB, normal lipase, lactic acid of 2.6, normal LFTs, Cr of 1.47, and elevated troponin to 1.1. She was found to be hypotensive with SBP of 70 (although the BP cuff was very small and was placed on her right forearm). She was given IVF and BP came up to 100. Her Abdominal CT showed cholelithiasis, no acute cholecystitis, and no acute process.   Assessment/Plan:  Hypotension BP has stabilized - pt is not tachycardic  Abdominal pain - w/hx of chronic abdom pain CT scan w/o findings - procalcitonin not c/w severe infection - LFTs not suggestive of obstructive picture - lipase normal - has hx of chronic constipation w/ noncompliance w/ bowel regimen  Leukocytosis WBC climbing - UA negative - afeb -   Elevated troponin / Chest pain / Coronary artery disease s/p IMI 2004 tx with BMS to RCA; myoview 1/07: EF 49%, inf scar, no isch; s/p Promus DES to RCA 2/12 (cath: LM 20-30%, pLAD 20-30%, mLAD 50%, RI 30%, pRCA 60%, mRCA 90% - tx with PCI). Echo (6/12): EF 60%, mild LVH - troponin appears to have peaked - Cardiology is following - I agree that PE must be at the top of the differential - f/u VQ scan  OHS/SA  Chronic kidney disease - Stage 3 Baseline crt appears to be ~1.3-1.5  COPD  Normocytic anemia Baseline Hgb appears to be ~10 morbid obesity  DM2  Code Status: Full Disposition Plan:   Consultants: [facility]  Procedures:   Antibiotics: none  DVT prophylaxis: Full  dose IV heparin   HPI/Subjective: Pt is seen for a f/u visit   Objective: Blood pressure 114/61, pulse 74, temperature 97.6 F (36.4 C), temperature source Oral, resp. rate 14, height 5\' 7"  (1.702 m), weight 154.6 kg (340 lb 13.3 oz), SpO2 100.00%.  Intake/Output Summary (Last 24 hours) at 01/26/12 1351 Last data filed at 01/26/12 1000  Gross per 24 hour  Intake    216 ml  Output    930 ml  Net   -714 ml     Exam: F/u exam completed  Data Reviewed: Basic Metabolic Panel:  Lab [id number] 01/25/12 1921  NA 133* 133*  K 5.2* 5.1  CL 100 97  CO2 20 22  GLUCOSE 214* 266*  BUN 20 19  CREATININE 1.25* 1.47*  CALCIUM 8.5 9.2  MG -- --  PHOS -- --   Liver Function Tests:  Lab 01/25/12 1921  AST 24  ALT 21  ALKPHOS 80  BILITOT 0.3  PROT 7.5  ALBUMIN 3.2*    Lab 01/25/12 1921  LIPASE 52  AMYLASE --   No results found for this basename: AMMONIA:5 in the last 168 hours CBC:  Lab 01/26/12 0306 01/25/12 1921  WBC 15.5* 14.7*  NEUTROABS -- 10.9*  HGB 9.1* 9.8*  HCT 28.2* 31.1*  MCV 85.2 85.9  PLT 263 284   Cardiac Enzymes:  Lab 01/26/12 0843 01/26/12 0326 01/26/12 0110 01/25/12 1921  CKTOTAL -- -- 116 --  CKMB -- -- 8.2* --  CKMBINDEX -- -- -- --  TROPONINI 2.38* 2.60* 1.34* 1.14*   BNP (last 3 results)  Basename 01/26/12 0110  PROBNP 382.5   CBG:  Lab 01/26/12 0826 01/26/12 0327 01/26/12 0042  GLUCAP 129* 193* 226*    Recent Results (from the past 240 hour(s))  MRSA PCR SCREENING     Status: Normal   Collection Time   01/26/12  3:16 AM      Component Value Range Status Comment   MRSA by PCR NEGATIVE  NEGATIVE Final      Studies:  Recent x-ray studies have been reviewed in detail by the Attending Physician  Scheduled Meds:  Reviewed in detail by the Attending Physician   Cherene Altes, MD [facility] Office  [phone] Pager [phone]  On-Call/Text Page:      [email or web address]      password [email or web address]  If 7PM-7AM, please  contact night-coverage [email or web address] Password [email or web address] 01/26/2012, 1:51 PM   LOS: 1 day

## 2012-01-26 NOTE — Progress Notes (Signed)
  Echocardiogram 2D Echocardiogram has been performed.  [name] 01/26/2012, 4:29 PM

## 2012-01-26 NOTE — Progress Notes (Signed)
Cardiology Progress Note Patient Name: [name] Date of Encounter: 01/26/2012, 8:56 AM     Subjective  No overnight events. Patient reports abdominal pain and breathing are much improved. Now only with some mild abdominal "soreness". Denies chest pain.   Objective   Telemetry: Sinus rhythm 70-80s  Medications: . aspirin  324 mg Oral Once  . aspirin  81 mg Oral Daily  . docusate sodium  300 mg Oral Daily  . DULoxetine  60 mg Oral Daily  . exenatide  10 mcg Subcutaneous BID WC  . heparin  5,000 Units Intravenous Once  . insulin aspart  0-15 Units Subcutaneous Q4H  . insulin glargine  10 Units Subcutaneous QHS  . iohexol  20 mL Oral Q1 Hr x 2  . lactobacillus acidophilus  1 tablet Oral Daily  . LORazepam  0.5 mg Intravenous Once  . nystatin  1 g Topical TID  . nystatin cream  1 application Topical TID  . pantoprazole  40 mg Oral BID WC  . prasugrel  10 mg Oral Daily  . simvastatin  10 mg Oral q1800  . sodium chloride  1,000 mL Intravenous STAT  . sodium chloride  1,000 mL Intravenous Once  . sodium chloride  3 mL Intravenous Q12H  . sodium chloride  3 mL Intravenous Q12H   . heparin 1,700 Units/hr (01/26/12 0300)    Physical Exam: Temp:  [97.6 F (36.4 C)-98.3 F (36.8 C)] 97.6 F (36.4 C) (09/20 0800) Pulse Rate:  [74-103] 74  (09/20 0800) Resp:  [14-20] 14  (09/20 0800) BP: (72-132)/(36-84) 114/61 mmHg (09/20 0800) SpO2:  [98 %-100 %] 100 % (09/20 0800) Weight:  [328 lb 0.7 oz (148.8 kg)-340 lb 13.3 oz (154.6 kg)] 340 lb 13.3 oz (154.6 kg) (09/20 0315)  General: Obese black female, in no acute distress. Head: Normocephalic, atraumatic, sclera non-icteric, nares are without discharge.  Neck: Supple. Negative for carotid bruits or JVD Lungs: Clear bilaterally to auscultation without wheezes, rales, or rhonchi. Breathing is unlabored. Heart: Distant heart sounds. RRR S1 S2 without murmurs, rubs, or gallops.  Abdomen: Obese, Soft, epigastric tenderness,  non-distended with normoactive bowel sounds. No rebound/guarding. No obvious abdominal masses. Msk:  Strength and tone appear normal for age. Extremities: No edema. No clubbing or cyanosis. Distal pedal pulses are intact and equal bilaterally. Neuro: Alert and oriented X 3. Moves all extremities spontaneously. Psych:  Responds to questions appropriately with a normal affect.   Intake/Output Summary (Last 24 hours) at 01/26/12 0856 Last data filed at 01/26/12 0700  Gross per 24 hour  Intake    135 ml  Output    530 ml  Net   -395 ml    Labs:  [facility] 01/26/12 0306 01/25/12 1921  NA 133* 133*  K 5.2* 5.1  CL 100 97  CO2 20 22  GLUCOSE 214* 266*  BUN 20 19  CREATININE 1.25* 1.47*  CALCIUM 8.5 9.2   Basename 01/25/12 1921  AST 24  ALT 21  ALKPHOS 80  BILITOT 0.3  PROT 7.5  ALBUMIN 3.2*   Basename 01/25/12 1921  LIPASE 52   Basename 01/26/12 0306 01/25/12 1921  WBC 15.5* 14.7*  NEUTROABS -- 10.9*  HGB 9.1* 9.8*  HCT 28.2* 31.1*  MCV 85.2 85.9  PLT 263 284     01/26/2012 03:05  Prothrombin Time 14.3  INR 1.13   Basename 01/26/12 0326 01/26/12 0110 01/25/12 1921  CKTOTAL -- 116 --  CKMB -- 8.2* --  TROPONINI 2.60* 1.34* 1.14*     01/26/2012 01:10  Pro B Natriuretic peptide (BNP) 382.5     01/25/2012 19:21  Lactic Acid, Venous 2.6 (H)    01/26/2012 01:10  Procalcitonin 0.13   Radiology/Studies:   01/25/2012 - Ct Abdomen Pelvis W Contrast Findings: The lung bases are clear.  No pleural or pericardial effusion.  Coronary artery stents are noted.  There is a small hiatal hernia with postoperative change seen at the gastroesophageal junction.  As on the prior study, gallstones are identified measuring up to 2 cm in diameter.  No CT evidence of cholecystitis.  The liver is low attenuating consistent with fatty infiltration.  A small capsular surface calcification is unchanged.  No intraparenchymal lesion is identified.  No biliary ductal dilatation.  The spleen,  adrenal glands and pancreas are unremarkable.  Bilateral renal cysts are unchanged.  The patient is status post prior ventral hernia repair and gastric bypass.  Surgical anastomosis in the distal ileum in the right lower quadrant is again seen.  Small bowel is otherwise unremarkable.  The colon appears normal.  The appendix has been removed.  There is no lymphadenopathy or fluid.  Uterus, adnexa and urinary bladder all appear normal.  There is no focal bony abnormality with multilevel lumbar degenerative change identified.  IMPRESSION:  1.  No acute finding or finding to explain the patient's symptoms. 2.  Postoperative change as described. 3.  Gallstones without evidence of cholecystitis. 4.  Fatty infiltration of the liver.   01/25/2012 - Chest Port 1 View Findings: Low lung volumes exaggerate the heart size.  There is no edema or effusion to suggest failure.  No focal airspace disease evident.  The visualized soft tissues and bony thorax are unremarkable.  IMPRESSION:  1.  Low lung volumes. 2.  No acute cardiopulmonary disease.       Assessment and Plan   1. Acute on Chronic Abdominal Pain: No acute findings on abd CT. LFTs normal. Procalcitonin normal. Abd [place] pending.  2. Hypotension: Improved. SBPs 100-130s. Cont to hold ACEI for now. Consider resumption of BB if BP remains stable.  3. Leukocytosis: WBC 14.7-->15.5. Lactic Acid 2.6. Procalcitonin normal. Afebrile. CXR without infiltrates. UA normal. Blood and Urine Cx pending.  4. Elevated troponin with new iRBBB: Patient reported dyspnea and diaphoresis over the last couple of days and onset of chest pain in the ED with elevated JVP. BNP normal. Troponins 1.14-->2.6. CKMB 8.2. Concerns for ACS vs PE. On IV heparin. VQ scan and echo pending. Cont ASA, Effient and statin. Consider resumption of Imdur and BB if BP remains stable  5. Coronary Artery Disease: H/o DES to RCA 06/2010. Plans as above  6. Acute on CKD, Stage 3: Crt improving 1.47 -->  1.25  7. Normocytic Anemia: Baseline Hgb around 9.5-10. Hgb 9.1 today after IVF. No signs of active bleeding. Guaiac stools.  8. Diabetes mellitus: Metformin on hold. Cont Lantus and SSI  9. Hyperlipidemia: LFTs ok. Cont statin   Signed, HOPE, JESSICA PA-C  Patient has epigastric pain this morning that radiates up into her chest if she takes a deep breath.  She is short of breath if she sits up.  Troponin peaked at 2.6.  BNP elevated.  At this point, I am still quite concerned for PE.  Awaiting V/Q scan (she may end up needing a chest CT but had abdominal CT with contrast last night and creatinine is somewhat elevated).  She is on heparin gtt.  She also  will need echo today.  Noted plans for abdominal US today. BP stable today.  Will start her back on home dose Imdur and a low dose of Toprol XL.   Loralie Champagne 01/26/2012 10:22 AM

## 2012-01-26 NOTE — Progress Notes (Signed)
ANTICOAGULATION CONSULT NOTE - Follow Up Consult  Pharmacy Consult for heparin Indication: pulmonary embolus  Allergies  Allergen Reactions  . Codeine Nausea Only  . Sulfonamide Derivatives Swelling    To mouth    Patient Measurements: Height: 5\' 7"  (170.2 cm) Weight: 340 lb 13.3 oz (154.6 kg) IBW/kg (Calculated) : 61.6    Vital Signs: Temp: 98.1 F (36.7 C) (09/20 1509) Temp src: Oral (09/20 1509) BP: 141/55 mmHg (09/20 1600) Pulse Rate: 82  (09/20 1509)  Labs:  Flo Shanks 01/26/12 1848 01/26/12 1447 01/26/12 1007 01/26/12 0843 01/26/12 0326 01/26/12 0306 01/26/12 0305 01/26/12 0110 01/25/12 1921  HGB -- -- -- -- -- 9.1* -- -- 9.8*  HCT -- -- -- -- -- 28.2* -- -- 31.1*  PLT -- -- -- -- -- 263 -- -- 284  APTT -- -- -- -- -- -- -- -- --  LABPROT -- -- -- -- -- -- 14.3 -- --  INR -- -- -- -- -- -- 1.13 -- --  HEPARINUNFRC 0.37 -- 0.35 -- -- -- -- -- --  CREATININE -- -- -- -- -- 1.25* -- -- 1.47*  CKTOTAL -- -- -- -- -- -- -- 116 --  CKMB -- -- -- -- -- -- -- 8.2* --  TROPONINI -- 1.05* -- 2.38* 2.60* -- -- -- --    Estimated Creatinine Clearance: 60.7 ml/min (by C-G formula based on Cr of 1.25).   Medications:  Scheduled:     . aspirin  81 mg Oral Daily  . docusate sodium  300 mg Oral Daily  . DULoxetine  60 mg Oral Daily  . exenatide  10 mcg Subcutaneous BID WC  . heparin  5,000 Units Intravenous Once  . insulin aspart  0-15 Units Subcutaneous Q4H  . insulin glargine  10 Units Subcutaneous QHS  . iohexol  20 mL Oral Q1 Hr x 2  . isosorbide mononitrate  60 mg Oral Daily  . lactobacillus acidophilus  1 tablet Oral Daily  . LORazepam  0.5 mg Intravenous Once  . metoprolol succinate  12.5 mg Oral Daily  . nystatin  1 g Topical TID  . nystatin cream  1 application Topical TID  . pantoprazole  40 mg Oral BID WC  . prasugrel  10 mg Oral Daily  . simvastatin  10 mg Oral q1800  . sodium chloride  1,000 mL Intravenous STAT  . sodium chloride  1,000 mL Intravenous  Once  . sodium chloride  3 mL Intravenous Q12H  . DISCONTD: exenatide  10 mcg Subcutaneous BID WC  . DISCONTD: LORazepam  0.5 mg Intramuscular Once  . DISCONTD: sodium chloride  3 mL Intravenous Q12H    Assessment: 77 y.o female admitted with elevated troponin, chest pressure, diaphoresis.Noted  V/Q scan shows intermediate risk of PE.  Repeat HL remains therapeutic at 0.37.  Goal of Therapy:  Heparin level 0.3-0.7 units/ml Monitor platelets by anticoagulation protocol: Yes   Plan:  1.Continue heparin rate at 1700 units/hour 2.F/u daily HL and CBC   Thank you,  Francesca Jewett, PharmD 01/26/2012 8:15 PM

## 2012-01-26 NOTE — Progress Notes (Signed)
ANTICOAGULATION CONSULT NOTE - Follow Up Consult  Pharmacy Consult for heparin Indication: pulmonary embolus  Allergies  Allergen Reactions  . Codeine   . Sulfonamide Derivatives     Patient Measurements: Height: 5\' 7"  (170.2 cm) Weight: 340 lb 13.3 oz (154.6 kg) IBW/kg (Calculated) : 61.6    Vital Signs: Temp: 98.1 F (36.7 C) (09/20 1509) Temp src: Oral (09/20 1509) BP: 143/78 mmHg (09/20 1509) Pulse Rate: 82  (09/20 1509)  Labs:  Basename 01/26/12 1447 01/26/12 1007 01/26/12 0843 01/26/12 0326 01/26/12 0306 01/26/12 0305 01/26/12 0110 01/25/12 1921  HGB -- -- -- -- 9.1* -- -- 9.8*  HCT -- -- -- -- 28.2* -- -- 31.1*  PLT -- -- -- -- 263 -- -- 284  APTT -- -- -- -- -- -- -- --  LABPROT -- -- -- -- -- 14.3 -- --  INR -- -- -- -- -- 1.13 -- --  HEPARINUNFRC -- 0.35 -- -- -- -- -- --  CREATININE -- -- -- -- 1.25* -- -- 1.47*  CKTOTAL -- -- -- -- -- -- 116 --  CKMB -- -- -- -- -- -- 8.2* --  TROPONINI 1.05* -- 2.38* 2.60* -- -- -- --    Estimated Creatinine Clearance: 60.7 ml/min (by C-G formula based on Cr of 1.25).   Medications:  Scheduled:    . aspirin  324 mg Oral Once  . aspirin  81 mg Oral Daily  . docusate sodium  300 mg Oral Daily  . DULoxetine  60 mg Oral Daily  . exenatide  10 mcg Subcutaneous BID WC  . heparin  5,000 Units Intravenous Once  . insulin aspart  0-15 Units Subcutaneous Q4H  . insulin glargine  10 Units Subcutaneous QHS  . iohexol  20 mL Oral Q1 Hr x 2  . isosorbide mononitrate  60 mg Oral Daily  . lactobacillus acidophilus  1 tablet Oral Daily  . LORazepam  0.5 mg Intravenous Once  . metoprolol succinate  12.5 mg Oral Daily  . nystatin  1 g Topical TID  . nystatin cream  1 application Topical TID  . pantoprazole  40 mg Oral BID WC  . prasugrel  10 mg Oral Daily  . simvastatin  10 mg Oral q1800  . sodium chloride  1,000 mL Intravenous STAT  . sodium chloride  1,000 mL Intravenous Once  . sodium chloride  3 mL Intravenous Q12H  .  DISCONTD: LORazepam  0.5 mg Intramuscular Once  . DISCONTD: sodium chloride  3 mL Intravenous Q12H    Assessment: 77 y.o female admitted with elevated troponin, chest pressure, diaphoresis.Noted  V/Q scan shows intermediate risk of PE.  Heparin level therapeutic this AM 0.35.  Goal of Therapy:  Heparin level 0.3-0.7 units/ml Monitor platelets by anticoagulation protocol: Yes   Plan:  1.Continue heparin rate at 1700 units/hour 2. Heparin level in 8 hours to confirm therapeutic 3. Daily CBC, monitor for s/sx bleeding  [name], [place] Pharmacist Pager:[phone] Phone [phone] 01/26/2012 4:45 PM

## 2012-01-26 NOTE — ED Provider Notes (Signed)
I have personally seen and examined the patient.  I have discussed the plan of care with the resident.  I have reviewed the documentation on PMH/FH/Soc. History.  I have reviewed the documentation of the resident and agree.  I have reviewed and agree with the ECG interpretation(s) documented by the resident.  Pt checked on frequently, BP improving, did have +troponin but no EKG changes Stabilized in the ED  CRITICAL CARE Performed by: Sharyon Cable   Total critical care time: 37  Critical care time was exclusive of separately billable procedures and treating other patients.  Critical care was necessary to treat or prevent imminent or life-threatening deterioration.  Critical care was time spent personally by me on the following activities: development of treatment plan with patient and/or surrogate as well as nursing, discussions with consultants, evaluation of patient's response to treatment, examination of patient, obtaining history from patient or surrogate, ordering and performing treatments and interventions, ordering and review of laboratory studies, ordering and review of radiographic studies, pulse oximetry and re-evaluation of patient's condition.   Sharyon Cable, MD 01/26/12 [phone]

## 2012-01-26 NOTE — Care Management Note (Addendum)
    Page 1 of 2   01/29/2012     2:36:08 PM   CARE MANAGEMENT NOTE 01/29/2012  Patient:  [name], [name]   Account Number:  [id number]  Date Initiated:  01/26/2012  Documentation initiated by:  Elissa Hefty  Subjective/Objective Assessment:   adm w hypotension, pos troponins     Action/Plan:   lives w family , pcp dr Barney Drain   Anticipated DC Date:  01/31/2012   Anticipated DC Plan:  [place]  CM consult      [place]   Choice offered to / List presented to:  C-1 Patient        [place] arranged  HH-1 RN  [place]      [place].   Status of service:  Completed, signed off Medicare Important Message given?   (If response is "NO", the following Medicare IM given date fields will be blank) Date Medicare IM given:   Date Additional Medicare IM given:    Discharge Disposition:  [place]  Per UR Regulation:  Reviewed for med. necessity/level of care/duration of stay  If discussed at [place] of Stay Meetings, dates discussed:    Comments:  9--23-13 [address] [place] [phone] CM did call [facility] to see which PCP pt is supposed to f/u with post d/c. Left several VM without any call back. Pt not able to state PCP at this time. CM did speak to pt and she is unwilling to go to snf at this time. Pt states she will have 24 hour supervision at home with daughter [name]. Pt states she has RW, Murray, 3n1 at home. No dme needs at this time. Pt will need an order for [facility], PT/OT, Aide and SW. MD is aware. CM will make referral for services with [facility]. SOC to begin within 24-48 hours post d/c. CM will continue to f/u.    9/20 9am debbie dowell rn,bsn [id number] will give pt effient 30day free card adn copay assist card.cm sec checked w ins. they could not tell us copay states  det at pharmacy by pt's income. no prior auth req.

## 2012-01-26 NOTE — Progress Notes (Signed)
Right:  DVT noted in the femoral vein.  No evidence of superficial thrombosis.  No Baker's cyst.  Left:  No evidence of DVT, superficial thrombosis, or Baker's cyst.

## 2012-01-27 DIAGNOSIS — I2510 Atherosclerotic heart disease of native coronary artery without angina pectoris: Secondary | ICD-10-CM

## 2012-01-27 DIAGNOSIS — I82409 Acute embolism and thrombosis of unspecified deep veins of unspecified lower extremity: Secondary | ICD-10-CM

## 2012-01-27 DIAGNOSIS — R079 Chest pain, unspecified: Secondary | ICD-10-CM

## 2012-01-27 DIAGNOSIS — Z86718 Personal history of other venous thrombosis and embolism: Secondary | ICD-10-CM

## 2012-01-27 HISTORY — DX: Personal history of other venous thrombosis and embolism: Z86.718

## 2012-01-27 LAB — GLUCOSE, CAPILLARY
Glucose-Capillary: 153 mg/dL — ABNORMAL HIGH (ref 70–99)
Glucose-Capillary: 157 mg/dL — ABNORMAL HIGH (ref 70–99)
Glucose-Capillary: 207 mg/dL — ABNORMAL HIGH (ref 70–99)
Glucose-Capillary: 169 mg/dL — ABNORMAL HIGH (ref 70–99)

## 2012-01-27 LAB — CBC
HCT: 28.3 % — ABNORMAL LOW (ref 36.0–46.0)
Hemoglobin: 8.8 g/dL — ABNORMAL LOW (ref 12.0–15.0)
MCH: 26.9 pg (ref 26.0–34.0)
MCHC: 31.1 g/dL (ref 30.0–36.0)
MCV: 86.5 fL (ref 78.0–100.0)
Platelets: 241 10*3/uL (ref 150–400)
RBC: 3.27 MIL/uL — ABNORMAL LOW (ref 3.87–5.11)
RDW: 15.5 % (ref 11.5–15.5)
WBC: 12.2 10*3/uL — ABNORMAL HIGH (ref 4.0–10.5)

## 2012-01-27 LAB — URINE CULTURE: Colony Count: 50000

## 2012-01-27 LAB — BASIC METABOLIC PANEL
BUN: 17 mg/dL (ref 6–23)
CO2: 24 mEq/L (ref 19–32)
Calcium: 8.9 mg/dL (ref 8.4–10.5)
Chloride: 107 mEq/L (ref 96–112)
Creatinine, Ser: 1.31 mg/dL — ABNORMAL HIGH (ref 0.50–1.10)
GFR calc Af Amer: 45 mL/min — ABNORMAL LOW (ref >90)
GFR calc non Af Amer: 39 mL/min — ABNORMAL LOW (ref >90)
Glucose, Bld: 180 mg/dL — ABNORMAL HIGH (ref 70–99)
Potassium: 5.1 mEq/L (ref 3.5–5.1)
Sodium: 140 mEq/L (ref 135–145)

## 2012-01-27 LAB — HEPARIN LEVEL (UNFRACTIONATED): Heparin Unfractionated: 0.48 IU/mL (ref 0.30–0.70)

## 2012-01-27 MED ORDER — COUMADIN BOOK
Freq: Once | Status: AC
  Administered 2012-01-27: 11:00:00
  Filled 2012-01-27: qty 1

## 2012-01-27 MED ORDER — INSULIN GLARGINE 100 UNIT/ML ~~LOC~~ SOLN
18.0000 [IU] | Freq: Every day | SUBCUTANEOUS | Status: DC
  Administered 2012-01-27 – 2012-01-28 (×2): 18 [IU] via SUBCUTANEOUS

## 2012-01-27 MED ORDER — WARFARIN VIDEO: Freq: Once | Status: DC

## 2012-01-27 MED ORDER — DULOXETINE HCL 60 MG PO CPEP
60.0000 mg | ORAL_CAPSULE | Freq: Every day | ORAL | Status: DC
  Administered 2012-01-28 – 2012-02-01 (×5): 60 mg via ORAL
  Filled 2012-01-27 (×6): qty 1

## 2012-01-27 MED ORDER — INSULIN ASPART 100 UNIT/ML ~~LOC~~ SOLN
0.0000 [IU] | Freq: Three times a day (TID) | SUBCUTANEOUS | Status: DC
  Administered 2012-01-27: 7 [IU] via SUBCUTANEOUS
  Administered 2012-01-28: 4 [IU] via SUBCUTANEOUS
  Administered 2012-01-28: 09:00:00 via SUBCUTANEOUS
  Administered 2012-01-28: 4 [IU] via SUBCUTANEOUS
  Administered 2012-01-29: 3 [IU] via SUBCUTANEOUS
  Administered 2012-01-29 – 2012-01-30 (×3): 4 [IU] via SUBCUTANEOUS
  Administered 2012-01-30: 5 [IU] via SUBCUTANEOUS
  Administered 2012-01-30 – 2012-02-01 (×6): 4 [IU] via SUBCUTANEOUS

## 2012-01-27 MED ORDER — WARFARIN SODIUM 7.5 MG PO TABS
7.5000 mg | ORAL_TABLET | Freq: Once | ORAL | Status: AC
  Administered 2012-01-27: 7.5 mg via ORAL
  Filled 2012-01-27: qty 1

## 2012-01-27 MED ORDER — INSULIN ASPART 100 UNIT/ML ~~LOC~~ SOLN: 0.0000 [IU] | Freq: Every day | SUBCUTANEOUS | Status: DC

## 2012-01-27 MED ORDER — BENZONATATE 100 MG PO CAPS
200.0000 mg | ORAL_CAPSULE | Freq: Three times a day (TID) | ORAL | Status: DC | PRN
  Administered 2012-01-27 – 2012-01-30 (×3): 200 mg via ORAL
  Filled 2012-01-27 (×3): qty 2

## 2012-01-27 MED ORDER — CLONAZEPAM 1 MG PO TABS
1.0000 mg | ORAL_TABLET | Freq: Every morning | ORAL | Status: DC
  Administered 2012-01-27 – 2012-02-01 (×6): 1 mg via ORAL
  Filled 2012-01-27 (×6): qty 1

## 2012-01-27 MED ORDER — GUAIFENESIN-DM 100-10 MG/5ML PO SYRP
5.0000 mL | ORAL_SOLUTION | ORAL | Status: DC | PRN
  Administered 2012-01-28 – 2012-02-01 (×2): 5 mL via ORAL
  Filled 2012-01-27 (×3): qty 5

## 2012-01-27 MED ORDER — BIOTENE DRY MOUTH MT LIQD
15.0000 mL | Freq: Two times a day (BID) | OROMUCOSAL | Status: DC
  Administered 2012-01-27 – 2012-02-01 (×9): 15 mL via OROMUCOSAL

## 2012-01-27 MED ORDER — WARFARIN - PHARMACIST DOSING INPATIENT: Freq: Every day | Status: DC

## 2012-01-27 NOTE — Progress Notes (Signed)
[facility] Progress Note [place] TEAM 1 - Stepdown/ICU TEAM   MELANNY KAWABATA [id number] DOB: 1934/06/26 DOA: 01/25/2012 PCP: Sherian Maroon, MD  Brief narrative: 77 y.o. female with known CAD s/p DES last year on Effient, HTN, Hyperlipidemia, DM, COPD, s/p gastric bypass, known cholelithiasis, presented to the ER originally with epigastric pain, then had transient chest pain in the ER, with diaphoresis, lightheadedness, but no pleuritic CP or SOB. Evaluation in the ER showed new RBBB, normal lipase, lactic acid of 2.6, normal LFTs, Cr of 1.47, and elevated troponin to 1.1. She was found to be hypotensive with SBP of 70 (although the BP cuff was very small and was placed on her right forearm). She was given IVF and BP came up to 100. Her Abdominal CT showed cholelithiasis, no acute cholecystitis, and no acute process.   Assessment/Plan:  Clinically diagnosed PE / confirmed RLE DVT dDimer markedly elevated - confirmed LE DVT - troponin elevated - R Heart strain on echo - feel pt clearly has experienced PE - will need minimum of 69months of anticoag - though given obesity as a major risk factor, and in light of fact that this risk factor will not be "reversible," I feel that lifelong anticoag is in her best interest  Hypotension BP has stabilized - pt is not tachycardic - felt to be due to PE  Abdominal pain - w/hx of chronic abdom pain CT scan w/o findings - procalcitonin not c/w severe infection - LFTs not suggestive of obstructive picture - lipase normal - has hx of chronic constipation w/ noncompliance w/ bowel regimen - [place] unrevealing - no further w/u indicated as inpatient   Leukocytosis WBC now improving - UA negative - afeb - likely due to stress demargination  Chest pain / Coronary artery disease s/p IMI 2004 tx with BMS to RCA; myoview 1/07: EF 49%, inf scar, no isch; s/p Promus DES to RCA 2/12 (cath: LM 20-30%, pLAD 20-30%, mLAD 50%, RI 30%, pRCA 60%, mRCA 90% -  tx with PCI). Echo (6/12): EF 60%, mild LVH - troponin appears to have peaked - Cardiology is following   OHS/SA Will need outpt sleep study to evaluate for CPAP   Chronic kidney disease - Stage 3 Baseline crt appears to be ~1.3-1.5 - stable renal function at this time  COPD Well compensated at present   Normocytic anemia Baseline Hgb appears to be ~10 - follow w/ ongoing anticoag - no obvious source of blood loss at this time  morbid obesity  DM2 Poorly controlled - adjust tx plan and follow  Code Status: Full Disposition Plan: stable for transfer to tele bed  Consultants: [facility]  Antibiotics: none  DVT prophylaxis: Full dose IV heparin >> coumadin   HPI/Subjective: Pt is feeling much better today.  Having occasional cough accompanied by pleuritic type chest wall pain.  No n/v, ha, or SSCP.  No hematochezia or hemoptysis.     Objective: Blood pressure 138/84, pulse 82, temperature 97.9 F (36.6 C), temperature source Oral, resp. rate 19, height 5\' 7"  (1.702 m), weight 154.6 kg (340 lb 13.3 oz), SpO2 97.00%.  Intake/Output Summary (Last 24 hours) at 01/27/12 1110 Last data filed at 01/27/12 1000  Gross per 24 hour  Intake    626 ml  Output   2075 ml  Net  -1449 ml    Exam: General: No acute respiratory distress at rest Lungs: distant bs th/o - no wheeze - no focal crackles Cardiovascular: distant heart sounds -  no appreciable gallup,rub Abdomen: morbidly obese - nontender, nondistended, soft, bowel sounds positive, no rebound Extremities: 2+ B LE edema wch appears to be chronic   Data Reviewed: Basic Metabolic Panel:  Lab [id number] 01/26/12 0306 01/25/12 1921  NA 140 133* 133*  K 5.1 5.2* 5.1  CL 107 100 97  CO2 24 20 22   GLUCOSE 180* 214* 266*  BUN 17 20 19   CREATININE 1.31* 1.25* 1.47*  CALCIUM 8.9 8.5 9.2  MG -- -- --  PHOS -- -- --   Liver Function Tests:  Lab 01/25/12 1921  AST 24  ALT 21  ALKPHOS 80  BILITOT 0.3  PROT  7.5  ALBUMIN 3.2*    Lab 01/25/12 1921  LIPASE 52  AMYLASE --   CBC:  Lab 01/27/12 0501 01/26/12 0306 01/25/12 1921  WBC 12.2* 15.5* 14.7*  NEUTROABS -- -- 10.9*  HGB 8.8* 9.1* 9.8*  HCT 28.3* 28.2* 31.1*  MCV 86.5 85.2 85.9  PLT 241 263 284   Cardiac Enzymes:  Lab 01/26/12 1447 01/26/12 0843 01/26/12 0326 01/26/12 0110 01/25/12 1921  CKTOTAL -- -- -- 116 --  CKMB -- -- -- 8.2* --  CKMBINDEX -- -- -- -- --  TROPONINI 1.05* 2.38* 2.60* 1.34* 1.14*   BNP (last 3 results)  Basename 01/26/12 0110  PROBNP 382.5   CBG:  Lab 01/27/12 0753 01/26/12 2020 01/26/12 1557 01/26/12 0826 01/26/12 0327  GLUCAP 153* 228* 123* 129* 193*    Recent Results (from the past 240 hour(s))  MRSA PCR SCREENING     Status: Normal   Collection Time   01/26/12  3:16 AM      Component Value Range Status Comment   MRSA by PCR NEGATIVE  NEGATIVE Final   URINE CULTURE     Status: Normal   Collection Time   01/26/12  6:00 AM      Component Value Range Status Comment   Specimen Description URINE, CLEAN CATCH   Final    Special Requests NONE   Final    Culture  Setup Time 01/26/2012 09:32   Final    Colony Count 50,000 COLONIES/ML   Final    Culture     Final    Value: Multiple bacterial morphotypes present, none predominant. Suggest appropriate recollection if clinically indicated.   Report Status 01/27/2012 FINAL   Final      Studies:  Recent x-ray studies have been reviewed in detail by the Attending Physician  Scheduled Meds:  Reviewed in detail by the Attending Physician   Cherene Altes, MD [facility] Office  [phone] Pager [phone]  On-Call/Text Page:      [email or web address]      password [email or web address]  If 7PM-7AM, please contact night-coverage [email or web address] Password [email or web address] 01/27/2012, 11:10 AM   LOS: 2 days

## 2012-01-27 NOTE — Progress Notes (Signed)
SUBJECTIVE:  Chest is somewhat sore and she reports that she has had some wheezing   PHYSICAL EXAM Filed Vitals:   01/27/12 0000 01/27/12 0400 01/27/12 0752 01/27/12 0755  BP: 120/74 141/69  125/64  Pulse:      Temp: 97.5 F (36.4 C) 97.7 F (36.5 C) 97.9 F (36.6 C)   TempSrc: Oral Oral Oral   Resp: 20 20  12   Height:      Weight:      SpO2: 96% 95% 97%    General:  No distress HEENT:  PERRL. Lungs:  Clear Heart:  RRR Abdomen:  Positive bowel sounds, no rebound no guarding Extremities:  No edema. Neuro:  Intact and nonfocal.  LABS: Lab Results  Component Value Date   CKTOTAL 116 01/26/2012   CKMB 8.2* 01/26/2012   TROPONINI 1.05* 01/26/2012   Results for orders placed during the hospital encounter of 01/25/12 (from the past 24 hour(s))  HEPARIN LEVEL (UNFRACTIONATED)     Status: Normal   Collection Time   01/26/12 10:07 AM      Component Value Range   Heparin Unfractionated 0.35  0.30 - 0.70 IU/mL  TROPONIN I     Status: Abnormal   Collection Time   01/26/12  2:47 PM      Component Value Range   Troponin I 1.05 (*) <0.30 ng/mL  D-DIMER, QUANTITATIVE     Status: Abnormal   Collection Time   01/26/12  2:47 PM      Component Value Range   D-Dimer, Quant 14.45 (*) 0.00 - 0.48 ug/mL-FEU  GLUCOSE, CAPILLARY     Status: Abnormal   Collection Time   01/26/12  3:57 PM      Component Value Range   Glucose-Capillary 123 (*) 70 - 99 mg/dL  URINALYSIS, ROUTINE W REFLEX MICROSCOPIC     Status: Normal   Collection Time   01/26/12  4:44 PM      Component Value Range   Color, Urine YELLOW  YELLOW   APPearance CLEAR  CLEAR   Specific Gravity, Urine 1.008  1.005 - 1.030   pH 5.5  5.0 - 8.0   Glucose, UA NEGATIVE  NEGATIVE mg/dL   Hgb urine dipstick NEGATIVE  NEGATIVE   Bilirubin Urine NEGATIVE  NEGATIVE   Ketones, ur NEGATIVE  NEGATIVE mg/dL   Protein, ur NEGATIVE  NEGATIVE mg/dL   Urobilinogen, UA 0.2  0.0 - 1.0 mg/dL   Nitrite NEGATIVE  NEGATIVE   Leukocytes, UA  NEGATIVE  NEGATIVE  HEPARIN LEVEL (UNFRACTIONATED)     Status: Normal   Collection Time   01/26/12  6:48 PM      Component Value Range   Heparin Unfractionated 0.37  0.30 - 0.70 IU/mL  GLUCOSE, CAPILLARY     Status: Abnormal   Collection Time   01/26/12  8:20 PM      Component Value Range   Glucose-Capillary 228 (*) 70 - 99 mg/dL  HEPARIN LEVEL (UNFRACTIONATED)     Status: Normal   Collection Time   01/27/12  5:01 AM      Component Value Range   Heparin Unfractionated 0.48  0.30 - 0.70 IU/mL  CBC     Status: Abnormal   Collection Time   01/27/12  5:01 AM      Component Value Range   WBC 12.2 (*) 4.0 - 10.5 K/uL   RBC 3.27 (*) 3.87 - 5.11 MIL/uL   Hemoglobin 8.8 (*) 12.0 - 15.0 g/dL   HCT 28.3 (*)  36.0 - 46.0 %   MCV 86.5  78.0 - 100.0 fL   MCH 26.9  26.0 - 34.0 pg   MCHC 31.1  30.0 - 36.0 g/dL   RDW 15.5  11.5 - 15.5 %   Platelets 241  150 - 400 K/uL  BASIC METABOLIC PANEL     Status: Abnormal   Collection Time   01/27/12  5:01 AM      Component Value Range   Sodium 140  135 - 145 mEq/L   Potassium 5.1  3.5 - 5.1 mEq/L   Chloride 107  96 - 112 mEq/L   CO2 24  19 - 32 mEq/L   Glucose, Bld 180 (*) 70 - 99 mg/dL   BUN 17  6 - 23 mg/dL   Creatinine, Ser 1.31 (*) 0.50 - 1.10 mg/dL   Calcium 8.9  8.4 - 10.5 mg/dL   GFR calc non Af Amer 39 (*) >90 mL/min   GFR calc Af Amer 45 (*) >90 mL/min  GLUCOSE, CAPILLARY     Status: Abnormal   Collection Time   01/27/12  7:53 AM      Component Value Range   Glucose-Capillary 153 (*) 70 - 99 mg/dL   Comment 1 Notify RN      Intake/Output Summary (Last 24 hours) at 01/27/12 0941 Last data filed at 01/27/12 0800  Gross per 24 hour  Intake    626 ml  Output   2475 ml  Net  -1849 ml    ASSESSMENT AND PLAN:  1. Acute on Chronic Abdominal Pain: No acute findings.  Plan per primary team.  2. Hypotension: Improved. SBPs 100-130s. Cont to hold ACEI for now. Consider resumption of BB if BP remains stable.   3. Leukocytosis: WBC falling.   No clear etiology.    4. Elevated troponin with new iRBBB: VQ scan was intermediate for PE.  Echo had poor windows.  However, EF looked to be OK.  RV was dilated however.  She has an acute DVT of the right femoral vein.  Currently on Heparin.  It would appear that pulmonary embolism would be a likely culprit for her elevated enzymes.  She will need warfarin.  Given this she can stop the Effient but continue the ASA.  Note she received a dose of Effient today.   5. Coronary Artery Disease: H/o DES to RCA 06/2010. Plans as above   6. Acute on CKD, Stage 3: Creat is stable.  Tolerating ACE.  7. Normocytic Anemia:   No signs of active bleeding. Guaiacs are pending.     Jeneen Rinks [facility] 01/27/2012 9:41 AM

## 2012-01-27 NOTE — Progress Notes (Addendum)
ANTICOAGULATION CONSULT NOTE - Follow Up Consult  Pharmacy Consult for Heparin Indication: ? PE, + DVT  Allergies  Allergen Reactions  . Codeine Nausea Only  . Sulfonamide Derivatives Swelling    To mouth    Vital Signs: Temp: 97.9 F (36.6 C) (09/21 0752) Temp src: Oral (09/21 0752) BP: 141/69 mmHg (09/21 0400)  Labs:  [name] 01/27/12 0501 01/26/12 1848 01/26/12 1447 01/26/12 1007 01/26/12 0843 01/26/12 0326 01/26/12 0306 01/26/12 0305 01/26/12 0110 01/25/12 1921  HGB 8.8* -- -- -- -- -- 9.1* -- -- --  HCT 28.3* -- -- -- -- -- 28.2* -- -- 31.1*  PLT 241 -- -- -- -- -- 263 -- -- 284  APTT -- -- -- -- -- -- -- -- -- --  LABPROT -- -- -- -- -- -- -- 14.3 -- --  INR -- -- -- -- -- -- -- 1.13 -- --  HEPARINUNFRC 0.48 0.37 -- 0.35 -- -- -- -- -- --  CREATININE 1.31* -- -- -- -- -- 1.25* -- -- 1.47*  CKTOTAL -- -- -- -- -- -- -- -- 116 --  CKMB -- -- -- -- -- -- -- -- 8.2* --  TROPONINI -- -- 1.05* -- 2.38* 2.60* -- -- -- --    Estimated Creatinine Clearance: 57.9 ml/min (by C-G formula based on Cr of 1.31).   Medications:  Heparin @ 1700 units/hr  Assessment: 77yof continues on heparin with a therapeutic heparin level. Noted to have positive DVT in right femoral vein and VQ scan with intermediate probability of PE. Hg/Hct slowly trending down, platelets stable. No bleeding noted.  Goal of Therapy:  Heparin level 0.3-0.7 units/ml Monitor platelets by anticoagulation protocol: Yes   Plan:  1) Continue heparin at 1700 units/hr 2) Follow up heparin level and CBC in AM 3) Follow up long-term anticoagulation plan  Deboraha Sprang 01/27/2012,7:56 AM   Patient now to begin coumadin. She will need at least 5 days of overlap therapy with heparin and coumadin. Today will be #1/5. Baseline INR 1.13, coumadin score=6.  Plan: 1) Coumadin 7.5mg  x 1 2) Daily INR 3) Coumadin education - book/video  Deboraha Sprang 01/27/2012, 10:40 AM

## 2012-01-28 DIAGNOSIS — I2699 Other pulmonary embolism without acute cor pulmonale: Secondary | ICD-10-CM | POA: Diagnosis present

## 2012-01-28 LAB — URINE CULTURE
Colony Count: NO GROWTH
Culture: NO GROWTH

## 2012-01-28 LAB — GLUCOSE, CAPILLARY
Glucose-Capillary: 173 mg/dL — ABNORMAL HIGH (ref 70–99)
Glucose-Capillary: 189 mg/dL — ABNORMAL HIGH (ref 70–99)
Glucose-Capillary: 188 mg/dL — ABNORMAL HIGH (ref 70–99)
Glucose-Capillary: 131 mg/dL — ABNORMAL HIGH (ref 70–99)

## 2012-01-28 LAB — CBC
HCT: 27.1 % — ABNORMAL LOW (ref 36.0–46.0)
Hemoglobin: 8.5 g/dL — ABNORMAL LOW (ref 12.0–15.0)
MCH: 27 pg (ref 26.0–34.0)
MCHC: 31.4 g/dL (ref 30.0–36.0)
MCV: 86 fL (ref 78.0–100.0)
Platelets: 219 10*3/uL (ref 150–400)
RBC: 3.15 MIL/uL — ABNORMAL LOW (ref 3.87–5.11)
RDW: 15.3 % (ref 11.5–15.5)
WBC: 10.6 10*3/uL — ABNORMAL HIGH (ref 4.0–10.5)

## 2012-01-28 LAB — PROTIME-INR
INR: 1.07 (ref 0.00–1.49)
Prothrombin Time: 13.8 seconds (ref 11.6–15.2)

## 2012-01-28 LAB — HEPARIN LEVEL (UNFRACTIONATED)
Heparin Unfractionated: 0.25 IU/mL — ABNORMAL LOW (ref 0.30–0.70)
Heparin Unfractionated: 0.4 IU/mL (ref 0.30–0.70)

## 2012-01-28 MED ORDER — LEVALBUTEROL HCL 0.63 MG/3ML IN NEBU
0.6300 mg | INHALATION_SOLUTION | Freq: Three times a day (TID) | RESPIRATORY_TRACT | Status: DC
  Administered 2012-01-29 – 2012-01-31 (×7): 0.63 mg via RESPIRATORY_TRACT
  Filled 2012-01-28 (×12): qty 3

## 2012-01-28 MED ORDER — HEPARIN BOLUS VIA INFUSION
1400.0000 [IU] | Freq: Once | INTRAVENOUS | Status: AC
  Administered 2012-01-28: 1400 [IU] via INTRAVENOUS
  Filled 2012-01-28: qty 1400

## 2012-01-28 MED ORDER — WARFARIN SODIUM 7.5 MG PO TABS
7.5000 mg | ORAL_TABLET | Freq: Once | ORAL | Status: AC
  Administered 2012-01-28: 7.5 mg via ORAL
  Filled 2012-01-28: qty 1

## 2012-01-28 NOTE — Evaluation (Signed)
Physical Therapy Evaluation Patient Details Name: [name] MRN: [id number] DOB: 1934-12-03 Today's Date: 01/28/2012 Time: [id number] PT Time Calculation (min): 30 min  PT Assessment / Plan / Recommendation Clinical Impression  Patient is a 77 yo female admitted with pulm emboli and DVT right LE.  Patient limited by dyspnea today.  Patient reports that pta she used a wheelchair for mobility.   She remained in the bed most of the time.  She only uses her walker to get from w/c to shower seat.  Patient will benefit from acute PT for mobility training to maximize independence and decrease caregiver burden of care.  Recommend HHPT at discharge.    PT Assessment  Patient needs continued PT services    Follow Up Recommendations  Home health PT;Supervision/Assistance - 24 hour    Barriers to Discharge        Equipment Recommendations  None recommended by PT    Recommendations for Other Services     Frequency Min 3X/week    Precautions / Restrictions Precautions Precautions: Fall Restrictions Weight Bearing Restrictions: No         Mobility  Bed Mobility Bed Mobility: Rolling Left;Left Sidelying to Sit;Sitting - Scoot to Edge of Bed Rolling Left: 4: Min assist;With rail Left Sidelying to Sit: 3: Mod assist;With rails Sitting - Scoot to Edge of Bed: 4: Min assist Details for Bed Mobility Assistance: Verbal cues for technique.  Assist to raise trunk off bed. Transfers Transfers: Sit to Stand;Stand to Sit;Stand Pivot Transfers Sit to Stand: 1: +2 Total assist;With upper extremity assist;From bed Sit to Stand: Patient Percentage: 70% Stand to Sit: 1: +2 Total assist;With upper extremity assist;With armrests;To chair/3-in-1 Stand to Sit: Patient Percentage: 70% Stand Pivot Transfers: 1: +2 Total assist Stand Pivot Transfers: Patient Percentage: 70% Details for Transfer Assistance: Verbal cues for safe hand placement.  Patient having difficulty getting to fully upright position  in standing.  Dyspnea limiting session. Ambulation/Gait Ambulation/Gait Assistance: Not tested (comment)    Exercises     PT Diagnosis: Difficulty walking;Generalized weakness;Acute pain  PT Problem List: Decreased strength;Decreased activity tolerance;Decreased balance;Decreased mobility;Cardiopulmonary status limiting activity;Obesity;Pain PT Treatment Interventions: DME instruction;Functional mobility training;Therapeutic activities;Balance training;Patient/family education   PT Goals Acute Rehab PT Goals PT Goal Formulation: With patient Time For Goal Achievement: 02/04/12 Potential to Achieve Goals: Good Pt will Roll Supine to Right Side: Independently PT Goal: Rolling Supine to Right Side - Progress: Goal set today Pt will Roll Supine to Left Side: Independently PT Goal: Rolling Supine to Left Side - Progress: Goal set today Pt will go Supine/Side to Sit: with supervision PT Goal: Supine/Side to Sit - Progress: Goal set today Pt will go Sit to Supine/Side: with min assist PT Goal: Sit to Supine/Side - Progress: Goal set today Pt will go Sit to Stand: with min assist PT Goal: Sit to Stand - Progress: Goal set today Pt will go Stand to Sit: with min assist PT Goal: Stand to Sit - Progress: Goal set today Pt will Transfer Bed to Chair/Chair to Bed: with min assist PT Transfer Goal: Bed to Chair/Chair to Bed - Progress: Goal set today  Visit Information  Last PT Received On: 01/28/12 Assistance Needed: +2    Subjective Data  Subjective: "I just get so out of breath" Patient Stated Goal: To return home   Prior Functioning  Home Living Lives With: Daughter Available Help at Discharge: Family;Available PRN/intermittently Type of Home: House Home Access: Ramped entrance Home Layout: Two level;Able to live  on main level with bedroom/bathroom Bathroom Shower/Tub: Multimedia programmer: Standard Bathroom Accessibility: Yes How Accessible: Accessible via  wheelchair [place]: Shower chair with back;Wheelchair - manual;Walker - rolling;Bedside commode/3-in-1 Prior Function Level of Independence: Needs assistance Needs Assistance: Bathing;Meal Prep;Light Housekeeping;Gait;Transfers Bath: Minimal Meal Prep: Total Light Housekeeping: Total Gait Assistance: Mod assist from w/c to shower seat Transfer Assistance: Min assist Able to Take Stairs?: No Driving: No Communication Communication: No difficulties    Cognition  Overall Cognitive Status: Appears within functional limits for tasks assessed/performed Arousal/Alertness: Awake/alert Orientation Level: Appears intact for tasks assessed Behavior During Session: [facility] for tasks performed    Extremity/Trunk Assessment Right Upper Extremity Assessment RUE ROM/Strength/Tone: [facility] for tasks assessed Left Upper Extremity Assessment LUE ROM/Strength/Tone: WFL for tasks assessed Right Lower Extremity Assessment RLE ROM/Strength/Tone: Deficits RLE ROM/Strength/Tone Deficits: General weakness; pain in knee impacts mobility Left Lower Extremity Assessment LLE ROM/Strength/Tone: Deficits LLE ROM/Strength/Tone Deficits: General weakness   Balance    End of Session PT - End of Session Equipment Utilized During Treatment: Gait belt;Oxygen Activity Tolerance: Patient limited by fatigue;Treatment limited secondary to medical complications (Comment) (Dyspnea 4/4 during activity.  Recovery in approx 2 minutes) Patient left: in chair;with call bell/phone within reach;with family/visitor present Nurse Communication: Mobility status  GP     Despina Pole 01/28/2012, 2:34 PM [name]. [name], [place] Pager [phone]

## 2012-01-28 NOTE — Progress Notes (Signed)
[facility] Progress Note    JADIE FIRESTONE [id number] DOB: Nov 25, 1934 DOA: 01/25/2012 PCP: Sherian Maroon, MD  Brief narrative: 77 y.o. female with known CAD s/p DES last year on Effient, HTN, Hyperlipidemia, DM, COPD, s/p gastric bypass, known cholelithiasis, presented to the ER originally with epigastric pain, then had transient chest pain in the ER, with diaphoresis, lightheadedness, but no pleuritic CP or SOB. Evaluation in the ER showed new RBBB, normal lipase, lactic acid of 2.6, normal LFTs, Cr of 1.47, and elevated troponin to 1.1. She was found to be hypotensive with SBP of 70 (although the BP cuff was very small and was placed on her right forearm). She was given IVF and BP came up to 100. Her Abdominal CT showed cholelithiasis, no acute cholecystitis, and no acute process. Found to have a PE.   Assessment/Plan:  Clinically diagnosed PE / confirmed RLE DVT dDimer markedly elevated - confirmed LE DVT - troponin elevated - R Heart strain on echo - feel pt clearly has experienced PE - will need minimum of 73months of anticoag - though given obesity as a major risk factor, and in light of fact that this risk factor will not be "reversible," I feel that lifelong anticoag is in her best interest. Continue heparin and coumadin overlap given patient's severe symptoms.   Hypotension due to cardiogenic shock in the setting of PE BP has stabilized - pt is not tachycardic - felt to be due to PE  Abdominal pain - w/hx of chronic abdom pain CT scan w/o findings - procalcitonin not c/w severe infection - LFTs not suggestive of obstructive picture - lipase normal - has hx of chronic constipation w/ noncompliance w/ bowel regimen - [place] unrevealing - no further w/u indicated as inpatient   Leukocytosis WBC now improving - UA negative - afeb - likely due to stress demargination  Chest pain / Coronary artery disease s/p IMI 2004 tx with BMS to RCA; myoview 1/07: EF 49%, inf scar, no  isch; s/p Promus DES to RCA 2/12 (cath: LM 20-30%, pLAD 20-30%, mLAD 50%, RI 30%, pRCA 60%, mRCA 90% - tx with PCI). Echo (6/12): EF 60%, mild LVH - troponin appears to have peaked - Cno further cardiac w/u for now    OHS/SA Will need outpt sleep study to evaluate for CPAP   Chronic kidney disease - Stage 3 Baseline crt appears to be ~1.3-1.5 - stable renal function at this time  COPD Well compensated at present   Normocytic anemia Baseline Hgb appears to be ~10 - follow w/ ongoing anticoag - no obvious source of blood loss at this time  morbid obesity  DM2 Poorly controlled - adjust tx plan and follow  Code Status: Full Disposition Plan:home  Consultants: [facility]  Antibiotics: none  DVT prophylaxis: Full dose IV heparin >> coumadin   HPI/Subjective: Pt is feeling much better today.  Having occasional cough accompanied by pleuritic type chest wall pain.  No n/v, ha, or SSCP.  No hematochezia or hemoptysis.     Objective: Blood pressure 115/70, pulse 99, temperature 97.7 F (36.5 C), temperature source Oral, resp. rate 18, height 5\' 7"  (1.702 m), weight 154.6 kg (340 lb 13.3 oz), SpO2 98.00%.  Intake/Output Summary (Last 24 hours) at 01/28/12 1733 Last data filed at 01/28/12 1300  Gross per 24 hour  Intake    358 ml  Output      0 ml  Net    358 ml    Exam: General: No  acute respiratory distress at rest Lungs: distant bs th/o - no wheeze - no focal crackles Cardiovascular: distant heart sounds - no appreciable gallup,rub Abdomen: morbidly obese - nontender, nondistended, soft, bowel sounds positive, no rebound Extremities: 2+ B LE edema wch appears to be chronic   Data Reviewed: Basic Metabolic Panel:  Lab [id number] 0501 01/26/12 0306 01/25/12 1921  NA 140 133* 133*  K 5.1 5.2* 5.1  CL 107 100 97  CO2 24 20 22   GLUCOSE 180* 214* 266*  BUN 17 20 19   CREATININE 1.31* 1.25* 1.47*  CALCIUM 8.9 8.5 9.2  MG -- -- --  PHOS -- -- --   Liver  Function Tests:  Lab 01/25/12 1921  AST 24  ALT 21  ALKPHOS 80  BILITOT 0.3  PROT 7.5  ALBUMIN 3.2*    Lab 01/25/12 1921  LIPASE 52  AMYLASE --   CBC:  Lab 01/28/12 0625 01/27/12 0501 01/26/12 0306 01/25/12 1921  WBC 10.6* 12.2* 15.5* 14.7*  NEUTROABS -- -- -- 10.9*  HGB 8.5* 8.8* 9.1* 9.8*  HCT 27.1* 28.3* 28.2* 31.1*  MCV 86.0 86.5 85.2 85.9  PLT 219 241 263 284   Cardiac Enzymes:  Lab 01/26/12 1447 01/26/12 0843 01/26/12 0326 01/26/12 0110 01/25/12 1921  CKTOTAL -- -- -- 116 --  CKMB -- -- -- 8.2* --  CKMBINDEX -- -- -- -- --  TROPONINI 1.05* 2.38* 2.60* 1.34* 1.14*   BNP (last 3 results)  Basename 01/26/12 0110  PROBNP 382.5   CBG:  Lab 01/28/12 1647 01/28/12 1126 01/28/12 0725 01/27/12 2117 01/27/12 1618  GLUCAP 188* 189* 173* 169* 207*    Recent Results (from the past 240 hour(s))  CULTURE, BLOOD (ROUTINE X 2)     Status: Normal (Preliminary result)   Collection Time   01/26/12  3:15 AM      Component Value Range Status Comment   Specimen Description BLOOD LEFT HAND   Final    Special Requests BOTTLES DRAWN AEROBIC AND ANAEROBIC 5CC   Final    Culture  Setup Time 01/26/2012 09:27   Final    Culture     Final    Value:        BLOOD CULTURE RECEIVED NO GROWTH TO DATE CULTURE WILL BE HELD FOR 5 DAYS BEFORE ISSUING A FINAL NEGATIVE REPORT   Report Status PENDING   Incomplete   MRSA PCR SCREENING     Status: Normal   Collection Time   01/26/12  3:16 AM      Component Value Range Status Comment   MRSA by PCR NEGATIVE  NEGATIVE Final   CULTURE, BLOOD (ROUTINE X 2)     Status: Normal (Preliminary result)   Collection Time   01/26/12  3:27 AM      Component Value Range Status Comment   Specimen Description BLOOD LEFT ARM   Final    Special Requests BOTTLES DRAWN AEROBIC AND ANAEROBIC 5CC   Final    Culture  Setup Time 01/26/2012 09:27   Final    Culture     Final    Value:        BLOOD CULTURE RECEIVED NO GROWTH TO DATE CULTURE WILL BE HELD FOR 5 DAYS  BEFORE ISSUING A FINAL NEGATIVE REPORT   Report Status PENDING   Incomplete   URINE CULTURE     Status: Normal   Collection Time   01/26/12  6:00 AM      Component Value Range Status Comment   Specimen  Description URINE, CLEAN CATCH   Final    Special Requests NONE   Final    Culture  Setup Time 01/26/2012 09:32   Final    Colony Count 50,000 COLONIES/ML   Final    Culture     Final    Value: Multiple bacterial morphotypes present, none predominant. Suggest appropriate recollection if clinically indicated.   Report Status 01/27/2012 FINAL   Final   URINE CULTURE     Status: Normal   Collection Time   01/26/12  4:44 PM      Component Value Range Status Comment   Specimen Description URINE, CATHETERIZED   Final    Special Requests NONE   Final    Culture  Setup Time 01/26/2012 19:28   Final    Colony Count NO GROWTH   Final    Culture NO GROWTH   Final    Report Status 01/28/2012 FINAL   Final      Studies:  Recent x-ray studies have been reviewed in detail by the Attending Physician  Scheduled Meds:     . antiseptic oral rinse  15 mL Mouth Rinse BID  . aspirin  81 mg Oral Daily  . clonazePAM  1 mg Oral q morning - 10a  . docusate sodium  300 mg Oral Daily  . DULoxetine  60 mg Oral Q breakfast  . exenatide  10 mcg Subcutaneous BID WC  . heparin  1,400 Units Intravenous Once  . insulin aspart  0-20 Units Subcutaneous TID WC  . insulin aspart  0-5 Units Subcutaneous QHS  . insulin glargine  18 Units Subcutaneous QHS  . isosorbide mononitrate  60 mg Oral Daily  . lactobacillus acidophilus  1 tablet Oral Daily  . levalbuterol  0.63 mg Nebulization Q8H  . metoprolol succinate  12.5 mg Oral Daily  . nystatin  1 g Topical TID  . nystatin cream  1 application Topical TID  . pantoprazole  40 mg Oral BID WC  . simvastatin  10 mg Oral q1800  . warfarin  7.5 mg Oral ONCE-1800  . warfarin  7.5 mg Oral ONCE-1800  . warfarin   Does not apply Once  . Warfarin - Pharmacist Dosing  Inpatient   Does not apply q1800      Teresea Donley [phone] On-Call/Text Page:      [email or web address]      password [email or web address]  If 7PM-7AM, please contact night-coverage [email or web address] Password [facility] 01/28/2012, 5:33 PM   LOS: 3 days

## 2012-01-28 NOTE — Progress Notes (Signed)
SUBJECTIVE:   [name] is a 77 yo with hx of CAD , s/p DES to RCA, HTN, hyperlipidemia, DM, COPD, morbid obesity admitted with hypotension, elevated JVD, abdominal pain.  VQ  Chest is somewhat sore and she reports that she has had some wheezing.  VQ study was read out as intermediate probability for pulmonary embolism.  Echo shows normal LV function  With mild RV dilitation and RV dysfunction with mild -moderate pulmonary hypertension.  Venous doppler was + for right femoral DVT.   She is feeling much better.   PHYSICAL EXAM Filed Vitals:   01/27/12 1157 01/27/12 1200 01/27/12 2100 01/28/12 0500  BP:  143/89 144/86 115/70  Pulse:   86 99  Temp: 98.6 F (37 C)  98.4 F (36.9 C) 97.7 F (36.5 C)  TempSrc: Oral  Oral Oral  Resp:  21 20 18   Height:      Weight:      SpO2: 98%  100% 98%   General:  No distress HEENT:  PERRL. Lungs:  Clear Heart:  RRR Abdomen:  Positive bowel sounds, no rebound no guarding, obese Extremities:  No edema. Neuro:  Intact and nonfocal.  LABS: Lab Results  Component Value Date   CKTOTAL 116 01/26/2012   CKMB 8.2* 01/26/2012   TROPONINI 1.05* 01/26/2012   Results for orders placed during the hospital encounter of 01/25/12 (from the past 24 hour(s))  GLUCOSE, CAPILLARY     Status: Abnormal   Collection Time   01/27/12 11:59 AM      Component Value Range   Glucose-Capillary 157 (*) 70 - 99 mg/dL   Comment 1 Notify RN    GLUCOSE, CAPILLARY     Status: Abnormal   Collection Time   01/27/12  4:18 PM      Component Value Range   Glucose-Capillary 207 (*) 70 - 99 mg/dL   Comment 1 Notify RN    GLUCOSE, CAPILLARY     Status: Abnormal   Collection Time   01/27/12  9:17 PM      Component Value Range   Glucose-Capillary 169 (*) 70 - 99 mg/dL  HEPARIN LEVEL (UNFRACTIONATED)     Status: Abnormal   Collection Time   01/28/12  6:25 AM      Component Value Range   Heparin Unfractionated 0.25 (*) 0.30 - 0.70 IU/mL  CBC     Status: Abnormal   Collection  Time   01/28/12  6:25 AM      Component Value Range   WBC 10.6 (*) 4.0 - 10.5 K/uL   RBC 3.15 (*) 3.87 - 5.11 MIL/uL   Hemoglobin 8.5 (*) 12.0 - 15.0 g/dL   HCT 27.1 (*) 36.0 - 46.0 %   MCV 86.0  78.0 - 100.0 fL   MCH 27.0  26.0 - 34.0 pg   MCHC 31.4  30.0 - 36.0 g/dL   RDW 15.3  11.5 - 15.5 %   Platelets 219  150 - 400 K/uL  PROTIME-INR     Status: Normal   Collection Time   01/28/12  6:25 AM      Component Value Range   Prothrombin Time 13.8  11.6 - 15.2 seconds   INR 1.07  0.00 - 1.49  GLUCOSE, CAPILLARY     Status: Abnormal   Collection Time   01/28/12  7:25 AM      Component Value Range   Glucose-Capillary 173 (*) 70 - 99 mg/dL   Comment 1 Notify RN  Intake/Output Summary (Last 24 hours) at 01/28/12 0832 Last data filed at 01/28/12 0600  Gross per 24 hour  Intake    609 ml  Output    350 ml  Net    259 ml    ASSESSMENT AND PLAN:  1. Acute on Chronic Abdominal Pain: No acute findings.  Plan per primary team.  2. Hypotension: Improved. SBPs 100-130s. Cont to hold ACEI for now. Consider resumption of BB if BP remains stable.   3. Leukocytosis: WBC falling.  No clear etiology.    4. Elevated troponin with new iRBBB: VQ scan was intermediate for PE.  Echo had poor windows.  However, EF looked to be OK.  RV was dilated however.  She has an acute DVT of the right femoral vein.  Currently on Heparin.  It would appear that pulmonary embolism would be a likely culprit for her elevated enzymes.  She will need warfarin.  Given this she can stop the Effient but continue the ASA.   5. Coronary Artery Disease: H/o DES to RCA 06/2010. Plans as above   6. Acute on CKD, Stage 3: Creat is stable.  Tolerating ACE.  7. Normocytic Anemia:   No signs of active bleeding. Guaiacs are pending.     Darden Amber. 01/28/2012 8:32 AM

## 2012-01-28 NOTE — Progress Notes (Signed)
ANTICOAGULATION CONSULT NOTE - Follow Up Consult  Pharmacy Consult for Heparin and Coumadin  Indication: ? PE, + DVT  Allergies  Allergen Reactions  . Codeine Nausea Only  . Sulfonamide Derivatives Swelling    To mouth    Vital Signs:    Labs:  Basename 01/28/12 1635 01/28/12 0625 01/27/12 0501 01/26/12 1447 01/26/12 0843 01/26/12 0326 01/26/12 0306 01/26/12 0305 01/26/12 0110 01/25/12 1921  HGB -- 8.5* 8.8* -- -- -- -- -- -- --  HCT -- 27.1* 28.3* -- -- -- 28.2* -- -- --  PLT -- 219 241 -- -- -- 263 -- -- --  APTT -- -- -- -- -- -- -- -- -- --  LABPROT -- 13.8 -- -- -- -- -- 14.3 -- --  INR -- 1.07 -- -- -- -- -- 1.13 -- --  HEPARINUNFRC 0.40 0.25* 0.48 -- -- -- -- -- -- --  CREATININE -- -- 1.31* -- -- -- 1.25* -- -- 1.47*  CKTOTAL -- -- -- -- -- -- -- -- 116 --  CKMB -- -- -- -- -- -- -- -- 8.2* --  TROPONINI -- -- -- 1.05* 2.38* 2.60* -- -- -- --    Estimated Creatinine Clearance: 57.9 ml/min (by C-G formula based on Cr of 1.31).   Medications:  Heparin @ 1700 units/hr  Assessment: Heparin level = 0.40  Goal of Therapy:  INR 2.0-3.0 Heparin level 0.3-0.7 units/ml Monitor platelets by anticoagulation protocol: Yes   Plan: 1) Continue heparin drip at 1700 units / hr 2) Follow up AM  Thank you. Anette Guarneri, PharmD [phone]  01/28/2012 5:15 PM

## 2012-01-28 NOTE — Progress Notes (Addendum)
ANTICOAGULATION CONSULT NOTE - Follow Up Consult  Pharmacy Consult for Heparin and Coumadin  Indication: ? PE, + DVT  Allergies  Allergen Reactions  . Codeine Nausea Only  . Sulfonamide Derivatives Swelling    To mouth    Vital Signs: Temp: 97.7 F (36.5 C) (09/22 0500) Temp src: Oral (09/22 0500) BP: 115/70 mmHg (09/22 0500) Pulse Rate: 99  (09/22 0500)  Labs:  Flo Shanks 01/28/12 0625 01/27/12 0501 01/26/12 1848 01/26/12 1447 01/26/12 0843 01/26/12 0326 01/26/12 0306 01/26/12 0305 01/26/12 0110 01/25/12 1921  HGB 8.5* 8.8* -- -- -- -- -- -- -- --  HCT 27.1* 28.3* -- -- -- -- 28.2* -- -- --  PLT 219 241 -- -- -- -- 263 -- -- --  APTT -- -- -- -- -- -- -- -- -- --  LABPROT 13.8 -- -- -- -- -- -- 14.3 -- --  INR 1.07 -- -- -- -- -- -- 1.13 -- --  HEPARINUNFRC 0.25* 0.48 0.37 -- -- -- -- -- -- --  CREATININE -- 1.31* -- -- -- -- 1.25* -- -- 1.47*  CKTOTAL -- -- -- -- -- -- -- -- 116 --  CKMB -- -- -- -- -- -- -- -- 8.2* --  TROPONINI -- -- -- 1.05* 2.38* 2.60* -- -- -- --    Estimated Creatinine Clearance: 57.9 ml/min (by C-G formula based on Cr of 1.31).   Medications:  Heparin @ 1700 units/hr  Assessment: [name] noted to have positive DVT in right femoral vein and VQ scan with intermediate probability of PE. Pt continues on heparin and now to begin coumadin with a 5 day overlap. Today will be day #2/5. Baseline INR 1.13 and down to 1.07 this morning after a dose of Coumadin last night (this morning's INR likely not reflective of last night's dose). Heparin level subtherapeutic this morning after being within therapeutic range. Nurse states there have not been any disruptions in the heparin infusion. Hg/Hct slowly trending down, platelets stable. No bleeding noted.  Goal of Therapy:  INR 2.0-3.0 Heparin level 0.3-0.7 units/ml Monitor platelets by anticoagulation protocol: Yes   Plan:  - Heparin bolus 1400 units x 1, then continue current heparin rate at 1700units/hr as  pt was therapeutic on this rate - Coumadin 7.5mg  x 1 dose tonight - Obtain 8hr heparin level  - INR in am  - Monitor for signs/symptoms of bleeding   [employer], Pharm.D. Clinical Pharmacist   Pager: [phone] 01/28/2012 8:48 AM

## 2012-01-29 DIAGNOSIS — I509 Heart failure, unspecified: Secondary | ICD-10-CM

## 2012-01-29 DIAGNOSIS — I5033 Acute on chronic diastolic (congestive) heart failure: Secondary | ICD-10-CM

## 2012-01-29 DIAGNOSIS — J449 Chronic obstructive pulmonary disease, unspecified: Secondary | ICD-10-CM

## 2012-01-29 DIAGNOSIS — I5032 Chronic diastolic (congestive) heart failure: Secondary | ICD-10-CM | POA: Diagnosis present

## 2012-01-29 LAB — HEPARIN LEVEL (UNFRACTIONATED)
Heparin Unfractionated: 0.19 IU/mL — ABNORMAL LOW (ref 0.30–0.70)
Heparin Unfractionated: 0.46 IU/mL (ref 0.30–0.70)

## 2012-01-29 LAB — GLUCOSE, CAPILLARY
Glucose-Capillary: 170 mg/dL — ABNORMAL HIGH (ref 70–99)
Glucose-Capillary: 174 mg/dL — ABNORMAL HIGH (ref 70–99)
Glucose-Capillary: 169 mg/dL — ABNORMAL HIGH (ref 70–99)
Glucose-Capillary: 161 mg/dL — ABNORMAL HIGH (ref 70–99)

## 2012-01-29 LAB — BASIC METABOLIC PANEL
BUN: 12 mg/dL (ref 6–23)
CO2: 22 mEq/L (ref 19–32)
Calcium: 8.7 mg/dL (ref 8.4–10.5)
Chloride: 104 mEq/L (ref 96–112)
Creatinine, Ser: 1.16 mg/dL — ABNORMAL HIGH (ref 0.50–1.10)
GFR calc Af Amer: 52 mL/min — ABNORMAL LOW (ref >90)
GFR calc non Af Amer: 45 mL/min — ABNORMAL LOW (ref >90)
Glucose, Bld: 174 mg/dL — ABNORMAL HIGH (ref 70–99)
Potassium: 4.7 mEq/L (ref 3.5–5.1)
Sodium: 136 mEq/L (ref 135–145)

## 2012-01-29 LAB — CBC
HCT: 26.7 % — ABNORMAL LOW (ref 36.0–46.0)
Hemoglobin: 8.4 g/dL — ABNORMAL LOW (ref 12.0–15.0)
MCH: 27.3 pg (ref 26.0–34.0)
MCHC: 31.5 g/dL (ref 30.0–36.0)
MCV: 86.7 fL (ref 78.0–100.0)
Platelets: 231 10*3/uL (ref 150–400)
RBC: 3.08 MIL/uL — ABNORMAL LOW (ref 3.87–5.11)
RDW: 15.2 % (ref 11.5–15.5)
WBC: 11.7 10*3/uL — ABNORMAL HIGH (ref 4.0–10.5)

## 2012-01-29 LAB — PROTIME-INR
INR: 1.15 (ref 0.00–1.49)
Prothrombin Time: 14.5 seconds (ref 11.6–15.2)

## 2012-01-29 MED ORDER — INSULIN GLARGINE 100 UNIT/ML ~~LOC~~ SOLN
20.0000 [IU] | Freq: Every day | SUBCUTANEOUS | Status: DC
  Administered 2012-01-29 – 2012-01-31 (×3): 20 [IU] via SUBCUTANEOUS

## 2012-01-29 MED ORDER — ENOXAPARIN SODIUM 150 MG/ML ~~LOC~~ SOLN
150.0000 mg | Freq: Two times a day (BID) | SUBCUTANEOUS | Status: AC
  Administered 2012-01-29 – 2012-01-31 (×5): 150 mg via SUBCUTANEOUS
  Filled 2012-01-29 (×6): qty 1

## 2012-01-29 MED ORDER — FUROSEMIDE 10 MG/ML IJ SOLN
40.0000 mg | Freq: Once | INTRAMUSCULAR | Status: AC
  Administered 2012-01-29: 40 mg via INTRAVENOUS
  Filled 2012-01-29: qty 4

## 2012-01-29 MED ORDER — WARFARIN SODIUM 10 MG PO TABS
10.0000 mg | ORAL_TABLET | Freq: Once | ORAL | Status: AC
  Administered 2012-01-29: 10 mg via ORAL
  Filled 2012-01-29: qty 1

## 2012-01-29 MED ORDER — HEPARIN BOLUS VIA INFUSION
2000.0000 [IU] | Freq: Once | INTRAVENOUS | Status: AC
  Administered 2012-01-29: 2000 [IU] via INTRAVENOUS
  Filled 2012-01-29: qty 2000

## 2012-01-29 MED ORDER — SENNOSIDES-DOCUSATE SODIUM 8.6-50 MG PO TABS
2.0000 | ORAL_TABLET | Freq: Every day | ORAL | Status: DC
  Administered 2012-01-29 – 2012-01-31 (×3): 2 via ORAL
  Filled 2012-01-29 (×5): qty 2

## 2012-01-29 NOTE — Progress Notes (Signed)
ANTICOAGULATION CONSULT NOTE - Follow Up Consult  Pharmacy Consult for switch from IV Heparin drip to SQ Lovenox (overlap with Coumadin) Indication: Clinically diagnosed  PE, + DVT  Allergies  Allergen Reactions  . Codeine Nausea Only  . Sulfonamide Derivatives Swelling    To mouth    Vital Signs: Temp: 97.3 F (36.3 C) (09/23 1400) BP: 131/74 mmHg (09/23 1819) Pulse Rate: 91  (09/23 1819)  Labs:  Basename 01/29/12 1315 01/29/12 0530 01/28/12 1635 01/28/12 0625 01/27/12 0501  HGB -- 8.4* -- 8.5* --  HCT -- 26.7* -- 27.1* 28.3*  PLT -- 231 -- 219 241  APTT -- -- -- -- --  LABPROT -- 14.5 -- 13.8 --  INR -- 1.15 -- 1.07 --  HEPARINUNFRC 0.46 0.19* 0.40 -- --  CREATININE -- 1.16* -- -- 1.31*  CKTOTAL -- -- -- -- --  CKMB -- -- -- -- --  TROPONINI -- -- -- -- --    Estimated Creatinine Clearance: 64.7 ml/min (by C-G formula based on Cr of 1.16).  Assessment: 77 yo female with PE/DVT for anticoagulation.  Currently on day #3 overlap Heparin /Coumadin. Therapeutic heparin level of 0.46 this AM on IV heparin rate of 2000 units/hr.  She has no noted bleeding complications with her anticoagulation. Now we will STOP IV heparin per MD's orders, then 1 hour later start Lovenox 150mg  SQ q12hr.  Monitor CBC q72hr.  Goal of Therapy:  INR 2.0-3.0 Heparin level 0.3-0.7 units/ml Monitor platelets by anticoagulation protocol: Yes   Plan: Stop IV heparin infusion, then 1 hour later give lovenox 150mg  SQ q12 hours.  CBC q72 hrs.  Nicole Cella, RPh Clinical Pharmacist Pager: [phone] 01/29/2012 6:53 PM

## 2012-01-29 NOTE — Progress Notes (Signed)
Patient ID: [name], female   DOB: [birth year], 77 y.o.   MRN: [id number]   SUBJECTIVE:   [name] is a 77 yo with hx of CAD , s/p DES to RCA, HTN, hyperlipidemia, DM, COPD, morbid obesity admitted with hypotension, elevated JVD, abdominal pain.  VQ  Chest is somewhat sore and she reports that she has had some wheezing.  VQ study was read out as intermediate probability for pulmonary embolism.  Echo shows normal LV function  With mild RV dilitation and RV dysfunction with mild -moderate pulmonary hypertension.  Venous doppler was + for right femoral DVT.   No chest pain   PHYSICAL EXAM Filed Vitals:   01/28/12 0500 01/28/12 2100 01/29/12 0500 01/29/12 0546  BP: 115/70 130/79 133/81   Pulse: 99 91 18 92  Temp: 97.7 F (36.5 C) 98.4 F (36.9 C) 97.8 F (36.6 C)   TempSrc: Oral Oral Oral   Resp: 18 18 18 18   Height:      Weight:   335 lb 6.4 oz (152.136 kg)   SpO2: 98% 97% 97% 97%   General:  No distress HEENT:  PERRL. Lungs:  Clear Heart:  RRR Abdomen:  Positive bowel sounds, no rebound no guarding, obese Extremities:  No edema. Neuro:  Intact and nonfocal.  LABS: Lab Results  Component Value Date   CKTOTAL 116 01/26/2012   CKMB 8.2* 01/26/2012   TROPONINI 1.05* 01/26/2012   Results for orders placed during the hospital encounter of 01/25/12 (from the past 24 hour(s))  GLUCOSE, CAPILLARY     Status: Abnormal   Collection Time   01/28/12 11:26 AM      Component Value Range   Glucose-Capillary 189 (*) 70 - 99 mg/dL   Comment 1 Notify RN    HEPARIN LEVEL (UNFRACTIONATED)     Status: Normal   Collection Time   01/28/12  4:35 PM      Component Value Range   Heparin Unfractionated 0.40  0.30 - 0.70 IU/mL  GLUCOSE, CAPILLARY     Status: Abnormal   Collection Time   01/28/12  4:47 PM      Component Value Range   Glucose-Capillary 188 (*) 70 - 99 mg/dL   Comment 1 Notify RN    GLUCOSE, CAPILLARY     Status: Abnormal   Collection Time   01/28/12  9:33 PM   Component Value Range   Glucose-Capillary 131 (*) 70 - 99 mg/dL  HEPARIN LEVEL (UNFRACTIONATED)     Status: Abnormal   Collection Time   01/29/12  5:30 AM      Component Value Range   Heparin Unfractionated 0.19 (*) 0.30 - 0.70 IU/mL  CBC     Status: Abnormal   Collection Time   01/29/12  5:30 AM      Component Value Range   WBC 11.7 (*) 4.0 - 10.5 K/uL   RBC 3.08 (*) 3.87 - 5.11 MIL/uL   Hemoglobin 8.4 (*) 12.0 - 15.0 g/dL   HCT 26.7 (*) 36.0 - 46.0 %   MCV 86.7  78.0 - 100.0 fL   MCH 27.3  26.0 - 34.0 pg   MCHC 31.5  30.0 - 36.0 g/dL   RDW 15.2  11.5 - 15.5 %   Platelets 231  150 - 400 K/uL  PROTIME-INR     Status: Normal   Collection Time   01/29/12  5:30 AM      Component Value Range   Prothrombin Time 14.5  11.6 - 15.2  seconds   INR 1.15  0.00 - 99991111  BASIC METABOLIC PANEL     Status: Abnormal   Collection Time   01/29/12  5:30 AM      Component Value Range   Sodium 136  135 - 145 mEq/L   Potassium 4.7  3.5 - 5.1 mEq/L   Chloride 104  96 - 112 mEq/L   CO2 22  19 - 32 mEq/L   Glucose, Bld 174 (*) 70 - 99 mg/dL   BUN 12  6 - 23 mg/dL   Creatinine, Ser 1.16 (*) 0.50 - 1.10 mg/dL   Calcium 8.7  8.4 - 10.5 mg/dL   GFR calc non Af Amer 45 (*) >90 mL/min   GFR calc Af Amer 52 (*) >90 mL/min  GLUCOSE, CAPILLARY     Status: Abnormal   Collection Time   01/29/12  7:22 AM      Component Value Range   Glucose-Capillary 170 (*) 70 - 99 mg/dL   Comment 1 Notify RN      Intake/Output Summary (Last 24 hours) at 01/29/12 0749 Last data filed at 01/29/12 0636  Gross per 24 hour  Intake 538.35 ml  Output    900 ml  Net -361.65 ml    ASSESSMENT AND PLAN:  1. Acute on Chronic Abdominal Pain: No acute findings.  Plan per primary team.  2. Hypotension: Improved. SBPs 100-130s. Cont to hold ACEI for now. Consider resumption of BB if BP remains stable.   3. Leukocytosis: WBC falling.  No clear etiology.    4. Elevated troponin with new iRBBB:  Related to PE No evidence of acute  coronary syndrome.  Anticoagulation with coumadin or xarelto.  Should w/u anemia Further since she now needs anticoagulation  5. Coronary Artery Disease: H/o DES to RCA 06/2010. 81 mg ASA  Effient D/C given need for anticoagulation  6. Acute on CKD, Stage 3: Creat is stable.  Tolerating ACE.  7. Normocytic Anemia:   Guaic stools and consider further w/u given need for anticoagulation     Jenkins Rouge 01/29/2012 7:49 AM

## 2012-01-29 NOTE — Progress Notes (Signed)
Occupational Therapy Evaluation      01/29/12 1100  OT Visit Information  Last OT Received On 01/29/12  Assistance Needed +2  OT Time Calculation  OT Start Time 0901  OT Stop Time 0956  OT Time Calculation (min) 55 min  Precautions  Precautions [place] With Daughter  Available Help at Discharge Family;Available 24 hours/day  Type of [place] Two level;Able to live on main level with bedroom/bathroom  Engineer, manufacturing systems Yes  How Accessible Accessible via wheelchair  [place] chair with back;Wheelchair - manual;Walker - rolling;Bedside commode/3-in-1  Prior Function  Level of Independence Needs assistance  Needs Assistance Bathing;Dressing;Meal Prep;Light Housekeeping;Gait;Transfers  Bath Moderate  Dressing Moderate  Meal Prep Total  Light Housekeeping Total  Gait Assistance Mod assist from w/c to shower seat  Transfer Assistance Min assist  Able to Take Stairs? No  Driving No  Comments Pt reports she sits in bed much of day  Communication  Communication No difficulties  ADL  Eating/Feeding Performed;Independent  Where Assessed - Eating/Feeding Edge of bed  Grooming Performed;Wash/dry face;Teeth care;Denture care;Supervision/safety  Where Assessed - Grooming Unsupported sitting  Upper Body Dressing Performed;Minimal assistance  Where Assessed - Upper Body Dressing Unsupported sitting  Lower Body Dressing Performed;+1 Total assistance  Where Assessed - Lower Body Dressing Unsupported sitting  Transfers/Ambulation Related to ADLs not attempted at this time   ADL Comments Limited by dyspnea. Total assist for donning bil socks. Pt reports she has never used AE for dressing before. Pt   Cognition  Overall Cognitive Status Appears within functional limits for tasks assessed/performed  Arousal/Alertness Awake/alert    Orientation Level Appears intact for tasks assessed  Behavior During Session [facility] for tasks performed  Right Upper Extremity Assessment  RUE ROM/Strength/Tone [facility] for tasks assessed  Left Upper Extremity Assessment  LUE ROM/Strength/Tone WFL for tasks assessed  Bed Mobility  Bed Mobility Supine to Sit;Sitting - Scoot to Edge of Bed  Supine to Sit 4: Min assist;HOB elevated;With rails  Sitting - Scoot to Edge of Bed 5: Supervision  Sit to Sidelying Left 3: Mod assist;With rail;HOB elevated  Details for Bed Mobility Assistance Assist to LEs for support in/out bed. Limited by Left ankle pain. Requires increased time due to large body habitus.  Transfers  Transfers Not assessed  Balance  Balance Assessed Yes  Static Sitting Balance  Static Sitting - Balance Support Feet supported  Static Sitting - Level of Assistance 6: Modified independent (Device/Increase time)  Static Sitting - Comment/# of Minutes Pt sat EOB >20 min to perfrom ADL tasks  OT - End of Session  Activity Tolerance Patient tolerated treatment well  Patient left in bed;with call bell/phone within reach;with family/visitor present  OT Assessment  Clinical Impression Statement Pt admitted with pulm emboli and DVT right LE. Will benefit from acute OT services to address below problem list in prep for d/c home with [place].  OT Recommendation/Assessment Patient needs continued OT Services  OT Problem List Decreased activity tolerance;Decreased knowledge of use of DME or AE;Obesity;Cardiopulmonary status limiting activity  OT Therapy Diagnosis  Generalized weakness  OT Plan  OT Frequency Min 2X/week  OT Treatment/Interventions Self-care/ADL training;Therapeutic activities;DME and/or AE instruction;Patient/family education  OT Recommendation  Follow Up Recommendations Home health OT;Supervision/Assistance - 24 hour  Equipment Recommended None recommended by OT  Individuals Consulted  Consulted and Agree with Results and  Recommendations Patient  Acute Rehab OT Goals  OT Goal Formulation With patient  Time For Goal Achievement 02/05/12  Potential to Achieve Goals Good  ADL Goals  Pt Will Perform Upper Body Bathing with set-up;Sitting, chair;Sitting, edge of bed  Pt Will Perform Lower Body Bathing with mod assist;Sit to stand from bed;Sit to stand from chair;Supported;with adaptive equipment  Pt Will Perform Lower Body Dressing with mod assist;Sit to stand from chair;Sit to stand from bed;with adaptive equipment  Pt Will Perform Upper Body Dressing Sitting, chair;Sitting, bed;with set-up  Pt Will Transfer to Toilet with min assist;Stand pivot transfer;with DME;Extra wide 3-in-1  ADL Goal: Upper Body Bathing - Progress Goal set today  ADL Goal: Lower Body Bathing - Progress Goal set today  ADL Goal: Upper Body Dressing - Progress Goal set today  ADL Goal: Lower Body Dressing - Progress Goal set today  ADL Goal: Toilet Transfer - Progress Goal set today  Miscellaneous OT Goals  OT Goal: Miscellaneous Goal #1 - Progress Goal set today  Miscellaneous OT Goal #1 Caregiver will be independent in assisting pt with all functional mobility.                      01/29/2012 Darrol Jump OTR/L Pager [phone] Office [phone]

## 2012-01-29 NOTE — Progress Notes (Signed)
Physical Therapy Treatment Patient Details Name: [name] MRN: [id number] DOB: 1934/07/25 Today's Date: 01/29/2012 Time: [id number] PT Time Calculation (min): 26 min  PT Assessment / Plan / Recommendation Comments on Treatment Session  Pt admitted with RLE DVT and PE but conplains of left foot pain as biggest limiting factor with mobility. Pt progressing with transfers and encouraged to be OOB daily and to continue home HEP throughout the day. Will continue to follow.     Follow Up Recommendations  Supervision for mobility/OOB    Barriers to Discharge        Equipment Recommendations       Recommendations for Other Services    Frequency     Plan Discharge plan remains appropriate;Frequency remains appropriate    Precautions / Restrictions Precautions Precautions: Fall   Pertinent Vitals/Pain Left foot pain 4/10 repositioned    Mobility  Bed Mobility Bed Mobility: Supine to Sit Supine to Sit: HOB elevated;5: Supervision Sitting - Scoot to Edge of Bed: 5: Supervision Sit to Sidelying Left: 3: Mod assist;With rail;HOB elevated Details for Bed Mobility Assistance: HOB 30 degrees without rail with increased time and supervision for lines and cueing. Increased time to scoot to edge of surface Transfers Transfers: Squat Pivot Transfers Sit to Stand: 4: Min guard;From bed;From elevated surface Stand to Sit: To chair/3-in-1;4: Min guard Squat Pivot Transfers: 4: Min guard Details for Transfer Assistance: Pt requesting PT back up so she can perform transfer pt able to shift weight anteriorly and perform transfers without assist with squat pivot as pt unable to achieve fully upright with standing. Pt performed transfer well without physical assist with setup prior to transfer performed.  Ambulation/Gait Ambulation/Gait Assistance: Not tested (comment) (pt non ambulatory x 46yr)    Exercises General Exercises - Lower Extremity Short Arc Quad: AROM;Both;20 reps;Seated Hip  Flexion/Marching: AROM;Both;20 reps;Seated   PT Diagnosis:    PT Problem List:   PT Treatment Interventions:     PT Goals Acute Rehab PT Goals PT Goal: Rolling Supine to Right Side - Progress: Discontinued (comment) (pt states she doesn't roll at home) PT Goal: Rolling Supine to Left Side - Progress: Discontinued (comment) (pt states she doesn't roll at home) Pt will go Supine/Side to Sit: with modified independence;Other (comment) (with HOB 20degrees) PT Goal: Supine/Side to Sit - Progress: Updated due to goal met Pt will go Sit to Stand: with modified independence PT Goal: Sit to Stand - Progress: Updated due to goal met Pt will go Stand to Sit: with modified independence PT Goal: Stand to Sit - Progress: Updated due to goals met Pt will Transfer Bed to Chair/Chair to Bed: with modified independence PT Transfer Goal: Bed to Chair/Chair to Bed - Progress: Updated due to goal met  Visit Information  Last PT Received On: 01/29/12 Assistance Needed: +1    Subjective Data  Subjective: I was doing better than this at home   Cognition  Overall Cognitive Status: Appears within functional limits for tasks assessed/performed Arousal/Alertness: Awake/alert Orientation Level: Appears intact for tasks assessed Behavior During Session: [facility] for tasks performed    Balance     End of Session PT - End of Session Activity Tolerance: Patient tolerated treatment well Patient left: in chair;with call bell/phone within reach   GP     Melford Aase 01/29/2012, 11:16 AM Elwyn Reach, [place]

## 2012-01-29 NOTE — Progress Notes (Signed)
[facility] Progress Note    SHANNA BRUDER [id number] DOB: 1935-04-01 DOA: 01/25/2012 PCP: Sherian Maroon, MD  Brief narrative: 77 y.o. female with known CAD s/p DES last year on Effient, HTN, Hyperlipidemia, DM, COPD, s/p gastric bypass, known cholelithiasis, presented to the ER originally with epigastric pain, then had transient chest pain in the ER, with diaphoresis, lightheadedness, but no pleuritic CP or SOB. Evaluation in the ER showed new RBBB, normal lipase, lactic acid of 2.6, normal LFTs, Cr of 1.47, and elevated troponin to 1.1. She was found to be hypotensive with SBP of 70 (although the BP cuff was very small and was placed on her right forearm). She was given IVF and BP came up to 100. Her Abdominal CT showed cholelithiasis, no acute cholecystitis, and no acute process. Found to have a PE.   Assessment/Plan:  Clinically diagnosed PE / confirmed RLE DVT dDimer markedly elevated - confirmed LE DVT - troponin elevated - R Heart strain on echo - feel pt clearly has experienced PE - will need minimum of 61months of anticoag - though given obesity as a major risk factor, and in light of fact that this risk factor will not be "reversible," I feel that lifelong anticoag is in her best interest. Patient received iv heparin and coumadin overlap for 72 hours . Heparin was changed to lovenox on 01/29/12   Hypotension due to cardiogenic shock in the setting of PE BP has stabilized - pt is not tachycardic - felt to be due to PE  Acute on chronic diastolic chf - stopped ivf , started iv lasix   Abdominal pain - w/hx of chronic abdom pain CT scan w/o findings - procalcitonin not c/w severe infection - LFTs not suggestive of obstructive picture - lipase normal - has hx of chronic constipation w/ noncompliance w/ bowel regimen - [place] unrevealing - no further w/u indicated as inpatient   Leukocytosis WBC now improving - UA negative - afeb - likely due to stress  demargination  Chest pain / Coronary artery disease s/p IMI 2004 tx with BMS to RCA; myoview 1/07: EF 49%, inf scar, no isch; s/p Promus DES to RCA 2/12 (cath: LM 20-30%, pLAD 20-30%, mLAD 50%, RI 30%, pRCA 60%, mRCA 90% - tx with PCI). Echo (6/12): EF 60%, mild LVH - troponin appears to have peaked - Cno further cardiac w/u for now    OHS/SA Will need outpt sleep study to evaluate for CPAP   Chronic kidney disease - Stage 3 Baseline crt appears to be ~1.3-1.5 - stable renal function at this time  COPD Well compensated at present   Normocytic anemia Baseline Hgb appears to be ~10 - follow w/ ongoing anticoag - no obvious source of blood loss at this time  morbid obesity  DM2 Poorly controlled - adjust tx plan and follow  Code Status: Full Disposition Plan:home  Consultants: [facility]   HPI/Subjective: C/o dyspnea and swelling of LE    Objective: Blood pressure 114/70, pulse 93, temperature 97.3 F (36.3 C), temperature source Oral, resp. rate 18, height 5\' 7"  (1.702 m), weight 152.136 kg (335 lb 6.4 oz), SpO2 97.00%.  Intake/Output Summary (Last 24 hours) at 01/29/12 1733 Last data filed at 01/29/12 0636  Gross per 24 hour  Intake 418.35 ml  Output    900 ml  Net -481.65 ml    Exam: General: No acute respiratory distress at rest Lungs: distant bs th/o - no wheeze -, but clear pseudowheezing  no focal crackles  Cardiovascular: distant heart sounds - no appreciable gallup,rub Abdomen: morbidly obese - nontender, nondistended, soft, bowel sounds positive, no rebound Extremities: 2+ B LE edema wch appears to be chronic   Data Reviewed: Basic Metabolic Panel:  Lab [id number] 0530 01/27/12 0501 01/26/12 0306 01/25/12 1921  NA 136 140 133* 133*  K 4.7 5.1 5.2* 5.1  CL 104 107 100 97  CO2 22 24 20 22   GLUCOSE 174* 180* 214* 266*  BUN 12 17 20 19   CREATININE 1.16* 1.31* 1.25* 1.47*  CALCIUM 8.7 8.9 8.5 9.2  MG -- -- -- --  PHOS -- -- -- --   Liver  Function Tests:  Lab 01/25/12 1921  AST 24  ALT 21  ALKPHOS 80  BILITOT 0.3  PROT 7.5  ALBUMIN 3.2*    Lab 01/25/12 1921  LIPASE 52  AMYLASE --   CBC:  Lab 01/29/12 0530 01/28/12 0625 01/27/12 0501 01/26/12 0306 01/25/12 1921  WBC 11.7* 10.6* 12.2* 15.5* 14.7*  NEUTROABS -- -- -- -- 10.9*  HGB 8.4* 8.5* 8.8* 9.1* 9.8*  HCT 26.7* 27.1* 28.3* 28.2* 31.1*  MCV 86.7 86.0 86.5 85.2 85.9  PLT 231 219 241 263 284   Cardiac Enzymes:  Lab 01/26/12 1447 01/26/12 0843 01/26/12 0326 01/26/12 0110 01/25/12 1921  CKTOTAL -- -- -- 116 --  CKMB -- -- -- 8.2* --  CKMBINDEX -- -- -- -- --  TROPONINI 1.05* 2.38* 2.60* 1.34* 1.14*   BNP (last 3 results)  Basename 01/26/12 0110  PROBNP 382.5   CBG:  Lab 01/29/12 1623 01/29/12 1151 01/29/12 0722 01/28/12 2133 01/28/12 1647  GLUCAP 169* 174* 170* 131* 188*    Recent Results (from the past 240 hour(s))  CULTURE, BLOOD (ROUTINE X 2)     Status: Normal (Preliminary result)   Collection Time   01/26/12  3:15 AM      Component Value Range Status Comment   Specimen Description BLOOD LEFT HAND   Final    Special Requests BOTTLES DRAWN AEROBIC AND ANAEROBIC 5CC   Final    Culture  Setup Time 01/26/2012 09:27   Final    Culture     Final    Value:        BLOOD CULTURE RECEIVED NO GROWTH TO DATE CULTURE WILL BE HELD FOR 5 DAYS BEFORE ISSUING A FINAL NEGATIVE REPORT   Report Status PENDING   Incomplete   MRSA PCR SCREENING     Status: Normal   Collection Time   01/26/12  3:16 AM      Component Value Range Status Comment   MRSA by PCR NEGATIVE  NEGATIVE Final   CULTURE, BLOOD (ROUTINE X 2)     Status: Normal (Preliminary result)   Collection Time   01/26/12  3:27 AM      Component Value Range Status Comment   Specimen Description BLOOD LEFT ARM   Final    Special Requests BOTTLES DRAWN AEROBIC AND ANAEROBIC 5CC   Final    Culture  Setup Time 01/26/2012 09:27   Final    Culture     Final    Value:        BLOOD CULTURE RECEIVED NO GROWTH  TO DATE CULTURE WILL BE HELD FOR 5 DAYS BEFORE ISSUING A FINAL NEGATIVE REPORT   Report Status PENDING   Incomplete   URINE CULTURE     Status: Normal   Collection Time   01/26/12  6:00 AM      Component Value Range Status  Comment   Specimen Description URINE, CLEAN CATCH   Final    Special Requests NONE   Final    Culture  Setup Time 01/26/2012 09:32   Final    Colony Count 50,000 COLONIES/ML   Final    Culture     Final    Value: Multiple bacterial morphotypes present, none predominant. Suggest appropriate recollection if clinically indicated.   Report Status 01/27/2012 FINAL   Final   URINE CULTURE     Status: Normal   Collection Time   01/26/12  4:44 PM      Component Value Range Status Comment   Specimen Description URINE, CATHETERIZED   Final    Special Requests NONE   Final    Culture  Setup Time 01/26/2012 19:28   Final    Colony Count NO GROWTH   Final    Culture NO GROWTH   Final    Report Status 01/28/2012 FINAL   Final      Studies:  Recent x-ray studies have been reviewed in detail by the Attending Physician  Scheduled Meds:     . antiseptic oral rinse  15 mL Mouth Rinse BID  . aspirin  81 mg Oral Daily  . clonazePAM  1 mg Oral q morning - 10a  . docusate sodium  300 mg Oral Daily  . DULoxetine  60 mg Oral Q breakfast  . exenatide  10 mcg Subcutaneous BID WC  . furosemide  40 mg Intravenous Once  . heparin  2,000 Units Intravenous Once  . insulin aspart  0-20 Units Subcutaneous TID WC  . insulin aspart  0-5 Units Subcutaneous QHS  . insulin glargine  18 Units Subcutaneous QHS  . isosorbide mononitrate  60 mg Oral Daily  . lactobacillus acidophilus  1 tablet Oral Daily  . levalbuterol  0.63 mg Nebulization Q8H  . metoprolol succinate  12.5 mg Oral Daily  . nystatin  1 g Topical TID  . nystatin cream  1 application Topical TID  . pantoprazole  40 mg Oral BID WC  . simvastatin  10 mg Oral q1800  . warfarin  10 mg Oral ONCE-1800  . warfarin  7.5 mg Oral  ONCE-1800  . warfarin   Does not apply Once  . Warfarin - Pharmacist Dosing Inpatient   Does not apply q1800      Avenly Roberge [id number] On-Call/Text Page:      [email or web address]      password [email or web address]  If 7PM-7AM, please contact night-coverage [email or web address] Password [facility] 01/29/2012, 5:33 PM   LOS: 4 days

## 2012-01-29 NOTE — Progress Notes (Signed)
ANTICOAGULATION CONSULT NOTE - Follow Up Consult  Pharmacy Consult for Heparin and Coumadin  Indication: ? PE, + DVT  Allergies  Allergen Reactions  . Codeine Nausea Only  . Sulfonamide Derivatives Swelling    To mouth    Vital Signs: Temp: 97.3 F (36.3 C) (09/23 1400) Temp src: Oral (09/23 0500) BP: 114/70 mmHg (09/23 1400) Pulse Rate: 93  (09/23 1400)  Labs:  Basename 01/29/12 1315 01/29/12 0530 01/28/12 1635 01/28/12 0625 01/27/12 0501 01/26/12 1447  HGB -- 8.4* -- 8.5* -- --  HCT -- 26.7* -- 27.1* 28.3* --  PLT -- 231 -- 219 241 --  APTT -- -- -- -- -- --  LABPROT -- 14.5 -- 13.8 -- --  INR -- 1.15 -- 1.07 -- --  HEPARINUNFRC 0.46 0.19* 0.40 -- -- --  CREATININE -- 1.16* -- -- 1.31* --  CKTOTAL -- -- -- -- -- --  CKMB -- -- -- -- -- --  TROPONINI -- -- -- -- -- 1.05*    Estimated Creatinine Clearance: 64.7 ml/min (by C-G formula based on Cr of 1.16).  Assessment: 77 yo female with PE/DVT for anticoagulation.  Heparin level this afternoon is within the desired goal range at 0.46 on IV heparin rate of 2000 units/hr.  She has no noted bleeding complications with her anticoagulation.  Goal of Therapy:  INR 2.0-3.0 Heparin level 0.3-0.7 units/ml Monitor platelets by anticoagulation protocol: Yes   Plan: - Continue IV Heparin at 2000 units/hr - F/U am heparin level and adjust - Coumadin 10 mg today - previously ordered  Rober Minion, PharmD., MS Clinical Pharmacist Pager:  [phone]  Thank you for allowing pharmacy to be part of this patients care team. 01/29/2012 2:25 PM

## 2012-01-29 NOTE — Progress Notes (Signed)
ANTICOAGULATION CONSULT NOTE - Follow Up Consult  Pharmacy Consult for Heparin and Coumadin  Indication: ? PE, + DVT  Allergies  Allergen Reactions  . Codeine Nausea Only  . Sulfonamide Derivatives Swelling    To mouth    Vital Signs: Temp: 98.4 F (36.9 C) (09/22 2100) Temp src: Oral (09/22 2100) BP: 130/79 mmHg (09/22 2100) Pulse Rate: 92  (09/23 0546)  Labs:  Basename 01/29/12 0530 01/28/12 1635 01/28/12 0625 01/27/12 0501 01/26/12 1447 01/26/12 0843  HGB 8.4* -- 8.5* -- -- --  HCT 26.7* -- 27.1* 28.3* -- --  PLT 231 -- 219 241 -- --  APTT -- -- -- -- -- --  LABPROT 14.5 -- 13.8 -- -- --  INR 1.15 -- 1.07 -- -- --  HEPARINUNFRC 0.19* 0.40 0.25* -- -- --  CREATININE -- -- -- 1.31* -- --  CKTOTAL -- -- -- -- -- --  CKMB -- -- -- -- -- --  TROPONINI -- -- -- -- 1.05* 2.38*    Estimated Creatinine Clearance: 57.9 ml/min (by C-G formula based on Cr of 1.31).  Assessment: 77 yo female with PE/DVT for anticoagulation  Goal of Therapy:  INR 2.0-3.0 Heparin level 0.3-0.7 units/ml Monitor platelets by anticoagulation protocol: Yes   Plan: Heparin 2000 units IV bolus, then increase heparin  2000 units/hr Check heparin level in 8 hours. Coumadin 10 mg today  Phillis Knack, PharmD, BCPS   01/29/2012 6:21 AM

## 2012-01-29 NOTE — Progress Notes (Signed)
Pt requesting to leave in foley catheter until tomorrow.  MD made aware and says it is OK to leave catheter in place, per patient's wish. Lina Sar

## 2012-01-30 DIAGNOSIS — D649 Anemia, unspecified: Secondary | ICD-10-CM

## 2012-01-30 LAB — CBC
HCT: 27.7 % — ABNORMAL LOW (ref 36.0–46.0)
Hemoglobin: 8.8 g/dL — ABNORMAL LOW (ref 12.0–15.0)
MCH: 27.4 pg (ref 26.0–34.0)
MCHC: 31.8 g/dL (ref 30.0–36.0)
MCV: 86.3 fL (ref 78.0–100.0)
Platelets: 255 10*3/uL (ref 150–400)
RBC: 3.21 MIL/uL — ABNORMAL LOW (ref 3.87–5.11)
RDW: 15.2 % (ref 11.5–15.5)
WBC: 10.3 10*3/uL (ref 4.0–10.5)

## 2012-01-30 LAB — BASIC METABOLIC PANEL
BUN: 13 mg/dL (ref 6–23)
CO2: 25 mEq/L (ref 19–32)
Calcium: 8.7 mg/dL (ref 8.4–10.5)
Chloride: 101 mEq/L (ref 96–112)
Creatinine, Ser: 1.25 mg/dL — ABNORMAL HIGH (ref 0.50–1.10)
GFR calc Af Amer: 48 mL/min — ABNORMAL LOW (ref >90)
GFR calc non Af Amer: 41 mL/min — ABNORMAL LOW (ref >90)
Glucose, Bld: 171 mg/dL — ABNORMAL HIGH (ref 70–99)
Potassium: 4.4 mEq/L (ref 3.5–5.1)
Sodium: 136 mEq/L (ref 135–145)

## 2012-01-30 LAB — GLUCOSE, CAPILLARY
Glucose-Capillary: 156 mg/dL — ABNORMAL HIGH (ref 70–99)
Glucose-Capillary: 183 mg/dL — ABNORMAL HIGH (ref 70–99)
Glucose-Capillary: 168 mg/dL — ABNORMAL HIGH (ref 70–99)

## 2012-01-30 LAB — PROTIME-INR
INR: 1.35 (ref 0.00–1.49)
Prothrombin Time: 16.4 seconds — ABNORMAL HIGH (ref 11.6–15.2)

## 2012-01-30 MED ORDER — FUROSEMIDE 10 MG/ML IJ SOLN
40.0000 mg | Freq: Once | INTRAMUSCULAR | Status: AC
  Administered 2012-01-30: 40 mg via INTRAVENOUS
  Filled 2012-01-30 (×2): qty 4

## 2012-01-30 MED ORDER — WARFARIN SODIUM 10 MG PO TABS
10.0000 mg | ORAL_TABLET | Freq: Once | ORAL | Status: AC
  Administered 2012-01-30: 10 mg via ORAL
  Filled 2012-01-30: qty 1

## 2012-01-30 MED ORDER — ACETAMINOPHEN 500 MG PO TABS
500.0000 mg | ORAL_TABLET | Freq: Four times a day (QID) | ORAL | Status: DC
  Administered 2012-01-30 – 2012-02-01 (×8): 500 mg via ORAL
  Filled 2012-01-30 (×12): qty 1

## 2012-01-30 MED ORDER — COLCHICINE 0.6 MG PO TABS
0.6000 mg | ORAL_TABLET | Freq: Two times a day (BID) | ORAL | Status: DC
  Administered 2012-01-30 – 2012-02-01 (×5): 0.6 mg via ORAL
  Filled 2012-01-30 (×6): qty 1

## 2012-01-30 MED ORDER — POLYETHYLENE GLYCOL 3350 17 G PO PACK
34.0000 g | PACK | Freq: Once | ORAL | Status: AC
  Administered 2012-01-30: 34 g via ORAL
  Filled 2012-01-30 (×2): qty 2

## 2012-01-30 NOTE — Progress Notes (Signed)
[facility] Progress Note    [name] [id number] DOB: 1935/04/19 DOA: 01/25/2012 PCP: Sherian Maroon, MD  Brief narrative: 77 y.o. female with known CAD s/p DES last year on Effient, HTN, Hyperlipidemia, DM, COPD, s/p gastric bypass, known cholelithiasis, presented to the ER originally with epigastric pain, then had transient chest pain in the ER, with diaphoresis, lightheadedness, but no pleuritic CP or SOB. Evaluation in the ER showed new RBBB, normal lipase, lactic acid of 2.6, normal LFTs, Cr of 1.47, and elevated troponin to 1.1. She was found to be hypotensive with SBP of 70 (although the BP cuff was very small and was placed on her right forearm). She was given IVF and BP came up to 100. Her Abdominal CT showed cholelithiasis, no acute cholecystitis, and no acute process. Found to have a PE.   Assessment/Plan:  Clinically diagnosed PE / confirmed RLE DVT dDimer markedly elevated - confirmed LE DVT - troponin elevated - R Heart strain on echo - feel pt clearly has experienced PE - will need minimum of 60months of anticoag - though given obesity as a major risk factor, and in light of fact that this risk factor will not be "reversible," I feel that lifelong anticoag is in her best interest. Patient received iv heparin and coumadin overlap for 72 hours . Heparin was changed to lovenox on 01/29/12   Hypotension due to cardiogenic shock in the setting of PE BP has stabilized - pt is not tachycardic - felt to be due to PE  Acute on chronic diastolic chf - stopped ivf , started iv lasix 9/23   Abdominal pain - w/hx of chronic abdom pain CT scan w/o findings - procalcitonin not c/w severe infection - LFTs not suggestive of obstructive picture - lipase normal - has hx of chronic constipation w/ noncompliance w/ bowel regimen - [place] unrevealing - no further w/u indicated as inpatient . Probable due constipation   Leukocytosis WBC now improving - UA negative - afeb - likely  due to stress demargination. Resolved by 9/24  Chest pain / Coronary artery disease s/p IMI 2004 tx with BMS to RCA; myoview 1/07: EF 49%, inf scar, no isch; s/p Promus DES to RCA 2/12 (cath: LM 20-30%, pLAD 20-30%, mLAD 50%, RI 30%, pRCA 60%, mRCA 90% - tx with PCI). Echo (6/12): EF 60%, mild LVH - troponin appears to have peaked - . no further cardiac w/u for now    OHS/SA Will need outpt sleep study to evaluate for CPAP   Chronic kidney disease - Stage 3 Baseline crt appears to be ~1.3-1.5 - stable renal function at this time  COPD Well compensated at present   Normocytic anemia Baseline Hgb appears to be ~10 - follow w/ ongoing anticoag - no obvious source of blood loss at this time  morbid obesity  DM2 Poorly controlled - adjust tx plan and follow  Bilateral feet pain - probably combination of plantar fasciitis and gout   Code Status: Full Disposition Plan:home  Consultants: [facility]   HPI/Subjective: C/o severe pain in the feet    Objective: Blood pressure 116/76, pulse 104, temperature 98.3 F (36.8 C), temperature source Oral, resp. rate 18, height 5\' 7"  (1.702 m), weight 152.136 kg (335 lb 6.4 oz), SpO2 100.00%.  Intake/Output Summary (Last 24 hours) at 01/30/12 1016 Last data filed at 01/30/12 0800  Gross per 24 hour  Intake 1044.33 ml  Output   1950 ml  Net -905.67 ml   Patient Vitals for  the past 24 hrs:  BP Temp Temp src Pulse Resp SpO2  01/30/12 0949 116/76 mmHg - - 104  - -  01/30/12 0609 116/56 mmHg 98.3 F (36.8 C) Oral 96  18  100 %  01/30/12 0527 - - - - - 98 %  01/29/12 2138 - - - - - 100 %  01/29/12 2100 141/71 mmHg 98.5 F (36.9 C) Oral 94  18  97 %  01/29/12 1819 131/74 mmHg - - 91  - -  01/29/12 1400 114/70 mmHg 97.3 F (36.3 C) - 93  18  97 %  01/29/12 1110 - - - 113  - 93 %     Exam: General: No acute respiratory distress at rest Lungs: distant bs th/o - no wheeze -, but clear pseudowheezing  no focal  crackles Cardiovascular: distant heart sounds Abdomen: morbidly obese - nontender, nondistended, soft, bowel sounds positive, no rebound Extremities: 2+ B LE edema wch appears to be chronic  Feet with pain in the joints and plantar area   Data Reviewed: Basic Metabolic Panel:  Lab [id number] 0012 01/29/12 0530 01/27/12 0501 01/26/12 0306 01/25/12 1921  NA 136 136 140 133* 133*  K 4.4 4.7 5.1 5.2* 5.1  CL 101 104 107 100 97  CO2 25 22 24 20 22   GLUCOSE 171* 174* 180* 214* 266*  BUN 13 12 17 20 19   CREATININE 1.25* 1.16* 1.31* 1.25* 1.47*  CALCIUM 8.7 8.7 8.9 8.5 9.2  MG -- -- -- -- --  PHOS -- -- -- -- --   Liver Function Tests:  Lab 01/25/12 1921  AST 24  ALT 21  ALKPHOS 80  BILITOT 0.3  PROT 7.5  ALBUMIN 3.2*    Lab 01/25/12 1921  LIPASE 52  AMYLASE --   CBC:  Lab 01/30/12 0012 01/29/12 0530 01/28/12 0625 01/27/12 0501 01/26/12 0306 01/25/12 1921  WBC 10.3 11.7* 10.6* 12.2* 15.5* --  NEUTROABS -- -- -- -- -- 10.9*  HGB 8.8* 8.4* 8.5* 8.8* 9.1* --  HCT 27.7* 26.7* 27.1* 28.3* 28.2* --  MCV 86.3 86.7 86.0 86.5 85.2 --  PLT 255 231 219 241 263 --   Cardiac Enzymes:  Lab 01/26/12 1447 01/26/12 0843 01/26/12 0326 01/26/12 0110 01/25/12 1921  CKTOTAL -- -- -- 116 --  CKMB -- -- -- 8.2* --  CKMBINDEX -- -- -- -- --  TROPONINI 1.05* 2.38* 2.60* 1.34* 1.14*   BNP (last 3 results)  Basename 01/26/12 0110  PROBNP 382.5   CBG:  Lab 01/30/12 0731 01/29/12 2112 01/29/12 1623 01/29/12 1151 01/29/12 0722  GLUCAP 156* 161* 169* 174* 170*    Recent Results (from the past 240 hour(s))  CULTURE, BLOOD (ROUTINE X 2)     Status: Normal (Preliminary result)   Collection Time   01/26/12  3:15 AM      Component Value Range Status Comment   Specimen Description BLOOD LEFT HAND   Final    Special Requests BOTTLES DRAWN AEROBIC AND ANAEROBIC 5CC   Final    Culture  Setup Time 01/26/2012 09:27   Final    Culture     Final    Value:        BLOOD CULTURE RECEIVED NO GROWTH TO  DATE CULTURE WILL BE HELD FOR 5 DAYS BEFORE ISSUING A FINAL NEGATIVE REPORT   Report Status PENDING   Incomplete   MRSA PCR SCREENING     Status: Normal   Collection Time   01/26/12  3:16 AM  Component Value Range Status Comment   MRSA by PCR NEGATIVE  NEGATIVE Final   CULTURE, BLOOD (ROUTINE X 2)     Status: Normal (Preliminary result)   Collection Time   01/26/12  3:27 AM      Component Value Range Status Comment   Specimen Description BLOOD LEFT ARM   Final    Special Requests BOTTLES DRAWN AEROBIC AND ANAEROBIC 5CC   Final    Culture  Setup Time 01/26/2012 09:27   Final    Culture     Final    Value:        BLOOD CULTURE RECEIVED NO GROWTH TO DATE CULTURE WILL BE HELD FOR 5 DAYS BEFORE ISSUING A FINAL NEGATIVE REPORT   Report Status PENDING   Incomplete   URINE CULTURE     Status: Normal   Collection Time   01/26/12  6:00 AM      Component Value Range Status Comment   Specimen Description URINE, CLEAN CATCH   Final    Special Requests NONE   Final    Culture  Setup Time 01/26/2012 09:32   Final    Colony Count 50,000 COLONIES/ML   Final    Culture     Final    Value: Multiple bacterial morphotypes present, none predominant. Suggest appropriate recollection if clinically indicated.   Report Status 01/27/2012 FINAL   Final   URINE CULTURE     Status: Normal   Collection Time   01/26/12  4:44 PM      Component Value Range Status Comment   Specimen Description URINE, CATHETERIZED   Final    Special Requests NONE   Final    Culture  Setup Time 01/26/2012 19:28   Final    Colony Count NO GROWTH   Final    Culture NO GROWTH   Final    Report Status 01/28/2012 FINAL   Final      Studies:  Recent x-ray studies have been reviewed in detail by the Attending Physician  Scheduled Meds:     . antiseptic oral rinse  15 mL Mouth Rinse BID  . aspirin  81 mg Oral Daily  . clonazePAM  1 mg Oral q morning - 10a  . docusate sodium  300 mg Oral Daily  . DULoxetine  60 mg Oral Q  breakfast  . enoxaparin (LOVENOX) injection  150 mg Subcutaneous Q12H  . exenatide  10 mcg Subcutaneous BID WC  . furosemide  40 mg Intravenous Once  . insulin aspart  0-20 Units Subcutaneous TID WC  . insulin aspart  0-5 Units Subcutaneous QHS  . insulin glargine  20 Units Subcutaneous QHS  . isosorbide mononitrate  60 mg Oral Daily  . lactobacillus acidophilus  1 tablet Oral Daily  . levalbuterol  0.63 mg Nebulization Q8H  . metoprolol succinate  12.5 mg Oral Daily  . nystatin  1 g Topical TID  . nystatin cream  1 application Topical TID  . pantoprazole  40 mg Oral BID WC  . senna-docusate  2 tablet Oral QHS  . simvastatin  10 mg Oral q1800  . warfarin  10 mg Oral ONCE-1800  . warfarin  10 mg Oral ONCE-1800  . warfarin   Does not apply Once  . Warfarin - Pharmacist Dosing Inpatient   Does not apply q1800  . DISCONTD: insulin glargine  18 Units Subcutaneous QHS      Aaryana Betke [id number] On-Call/Text Page:      [email or web address]  password [email or web address]  If 7PM-7AM, please contact night-coverage [email or web address] Password [email or web address] 01/30/2012, 10:16 AM   LOS: 5 days

## 2012-01-30 NOTE — Progress Notes (Signed)
ANTICOAGULATION CONSULT NOTE - Follow Up Consult  Pharmacy Consult for Lovenox / Coumadin Indication: Clinically diagnosed  PE, + DVT  Allergies  Allergen Reactions  . Codeine Nausea Only  . Sulfonamide Derivatives Swelling    To mouth    Vital Signs: Temp: 98.3 F (36.8 C) (09/24 0609) Temp src: Oral (09/24 0609) BP: 116/56 mmHg (09/24 0609) Pulse Rate: 96  (09/24 0609)  Labs:  Basename 01/30/12 0012 01/29/12 1315 01/29/12 0530 01/28/12 1635 01/28/12 0625  HGB 8.8* -- 8.4* -- --  HCT 27.7* -- 26.7* -- 27.1*  PLT 255 -- 231 -- 219  APTT -- -- -- -- --  LABPROT 16.4* -- 14.5 -- 13.8  INR 1.35 -- 1.15 -- 1.07  HEPARINUNFRC -- 0.46 0.19* 0.40 --  CREATININE 1.25* -- 1.16* -- --  CKTOTAL -- -- -- -- --  CKMB -- -- -- -- --  TROPONINI -- -- -- -- --    Estimated Creatinine Clearance: 60 ml/min (by C-G formula based on Cr of 1.25).  Assessment: 77 yo female with PE/DVT for anticoagulation.  Currently on day #4 overlap Lovenox /Coumadin.   Goal of Therapy:  INR 2.0-3.0  Monitor platelets by anticoagulation protocol: Yes   Plan: 1) Continue Lovenox 150 mg sq Q 12 hours 2) Coumadin 10 mg po x 1 dose 3) Follow up AM labs  Thank you. Anette Guarneri, PharmD [phone]  01/30/2012 8:58 AM

## 2012-01-30 NOTE — Progress Notes (Signed)
CSW met with pt at bedside along with pt rn case manager to discuss pt dc plans. Pt refusing snf and requesting Home health services with assistance from pt son and daughter. Pt RN case manager confirmed with pt family, pt discharge plan. .No further Clinical Social Work needs, signing off.   Dorathy Kinsman, E. Lopez .01/30/2012 1644pm

## 2012-01-31 LAB — BASIC METABOLIC PANEL
BUN: 14 mg/dL (ref 6–23)
CO2: 27 mEq/L (ref 19–32)
Calcium: 8.7 mg/dL (ref 8.4–10.5)
Chloride: 100 mEq/L (ref 96–112)
Creatinine, Ser: 1.39 mg/dL — ABNORMAL HIGH (ref 0.50–1.10)
GFR calc Af Amer: 42 mL/min — ABNORMAL LOW (ref >90)
GFR calc non Af Amer: 36 mL/min — ABNORMAL LOW (ref >90)
Glucose, Bld: 173 mg/dL — ABNORMAL HIGH (ref 70–99)
Potassium: 4.5 mEq/L (ref 3.5–5.1)
Sodium: 137 mEq/L (ref 135–145)

## 2012-01-31 LAB — GLUCOSE, CAPILLARY
Glucose-Capillary: 204 mg/dL — ABNORMAL HIGH (ref 70–99)
Glucose-Capillary: 157 mg/dL — ABNORMAL HIGH (ref 70–99)
Glucose-Capillary: 162 mg/dL — ABNORMAL HIGH (ref 70–99)
Glucose-Capillary: 191 mg/dL — ABNORMAL HIGH (ref 70–99)
Glucose-Capillary: 186 mg/dL — ABNORMAL HIGH (ref 70–99)

## 2012-01-31 LAB — PROTIME-INR
INR: 2.2 — ABNORMAL HIGH (ref 0.00–1.49)
Prothrombin Time: 23.5 seconds — ABNORMAL HIGH (ref 11.6–15.2)

## 2012-01-31 MED ORDER — ISOSORBIDE MONONITRATE ER 30 MG PO TB24
30.0000 mg | ORAL_TABLET | Freq: Every day | ORAL | Status: DC
  Administered 2012-02-01: 30 mg via ORAL
  Filled 2012-01-31: qty 1

## 2012-01-31 MED ORDER — WARFARIN SODIUM 7.5 MG PO TABS
7.5000 mg | ORAL_TABLET | Freq: Every day | ORAL | Status: DC
  Administered 2012-01-31: 7.5 mg via ORAL
  Filled 2012-01-31 (×2): qty 1

## 2012-01-31 MED ORDER — FUROSEMIDE 10 MG/ML IJ SOLN
40.0000 mg | Freq: Once | INTRAMUSCULAR | Status: AC
  Administered 2012-01-31: 40 mg via INTRAVENOUS
  Filled 2012-01-31: qty 4

## 2012-01-31 MED ORDER — LEVALBUTEROL HCL 0.63 MG/3ML IN NEBU
0.6300 mg | INHALATION_SOLUTION | Freq: Four times a day (QID) | RESPIRATORY_TRACT | Status: DC | PRN
  Filled 2012-01-31: qty 3

## 2012-01-31 NOTE — Progress Notes (Signed)
ANTICOAGULATION CONSULT NOTE - Follow Up Consult  Pharmacy Consult for Lovenox / Coumadin Indication: Clinically diagnosed  PE, + DVT  Allergies  Allergen Reactions  . Codeine Nausea Only  . Sulfonamide Derivatives Swelling    To mouth    Vital Signs: Temp: 98.3 F (36.8 C) (09/25 0500) Temp src: Oral (09/25 0500) BP: 97/68 mmHg (09/25 0500) Pulse Rate: 91  (09/25 0500)  Labs:  Basename 01/31/12 0540 01/30/12 0012 01/29/12 1315 01/29/12 0530 01/28/12 1635  HGB -- 8.8* -- 8.4* --  HCT -- 27.7* -- 26.7* --  PLT -- 255 -- 231 --  APTT -- -- -- -- --  LABPROT 23.5* 16.4* -- 14.5 --  INR 2.20* 1.35 -- 1.15 --  HEPARINUNFRC -- -- 0.46 0.19* 0.40  CREATININE 1.39* 1.25* -- 1.16* --  CKTOTAL -- -- -- -- --  CKMB -- -- -- -- --  TROPONINI -- -- -- -- --    Estimated Creatinine Clearance: 54 ml/min (by C-G formula based on Cr of 1.39).  Assessment: 77 yo female with PE/DVT for anticoagulation.  Currently on day #5/5 overlap Lovenox /Coumadin. Lovenox to stop after last dose tonight. INR therapeutic at 2.2 after 7.5-7.5 -10-10 mg doses. Large 7.1 second jump in protime.  No bleeding reported.     Goal of Therapy:  INR 2.0-3.0  Monitor platelets by anticoagulation protocol: Yes   Plan: 1) Continue Lovenox 150 mg sq Q 12 hours, last dose tonight, then stop 2) Coumadin 7.5 mg po daily at 1800 3) Follow am INR Eudelia Bunch, Pharm.D. [id number] 01/31/2012 9:57 AM

## 2012-01-31 NOTE — Progress Notes (Signed)
01/31/12 1400  PT Visit Information  Last PT Received On 01/31/12  Assistance Needed +1  PT Time Calculation  PT Start Time 1341  PT Stop Time 1358  PT Time Calculation (min) 17 min  Subjective Data  Subjective "I like to do it by myself if I can."  Precautions  Precautions Fall  Restrictions  Weight Bearing Restrictions No  Cognition  Overall Cognitive Status Appears within functional limits for tasks assessed/performed  Arousal/Alertness Awake/alert  Orientation Level Appears intact for tasks assessed  Behavior During Session [facility] for tasks performed  Bed Mobility  Bed Mobility Supine to Sit  Supine to Sit HOB flat;With rails;5: Supervision  Sitting - Scoot to Edge of Bed 5: Supervision  Details for Bed Mobility Assistance Increased time to complete tasks and uses momentum.  Able to perform with only supervision.  Transfers  Transfers Squat Pivot Transfers  Sit to Stand 4: Min assist;From elevated surface;With upper extremity assist;From bed  Stand to Sit 4: Min guard;With upper extremity assist;With armrests;To chair/3-in-1  Squat Pivot Transfers 4: Min assist;From elevated surface;With upper extremity assistance  Details for Transfer Assistance Pt unable to achieve full upright for transfer so performs squat pivot.  Pt requesting PTA to let her do by herself.  Pt did reach to hold PTA's leg during transfer to balance.  Ambulation/Gait  Ambulation/Gait Assistance Not tested (comment)  Static Sitting Balance  Static Sitting - Balance Support Bilateral upper extremity supported;Feet supported  Static Sitting - Level of Assistance 6: Modified independent (Device/Increase time)  Static Sitting - Comment/# of Minutes 10  PT - End of Session  Equipment Utilized During Treatment Oxygen  Activity Tolerance Patient tolerated treatment well  Patient left in chair;with call bell/phone within reach;with family/visitor present  Nurse Communication Mobility status  PT - Assessment/Plan    Comments on Treatment Session Pt without c/o left foot pain during transfer.  Verbalizes how she performs car transfer and ther ex program at home.  Continue to encourage OOB daily and HEP.  Pt for possible d/c home 9/26 by MD.  Pt to have 24/7 care upon d/c.  PT Plan Discharge plan remains appropriate;Frequency remains appropriate  PT Frequency Min 3X/week  Follow Up Recommendations Supervision for mobility/OOB  Equipment Recommended None recommended by PT;None recommended by OT  Acute Rehab PT Goals  PT Goal: Supine/Side to Sit - Progress Progressing toward goal  PT Transfer Goal: Bed to Chair/Chair to Bed - Progress Progressing toward goal  PT General Charges  $$ ACUTE PT VISIT 1 Procedure  PT Treatments  $Therapeutic Activity 8-22 mins   Narda Bonds, PTA Acute Rehab [phone] (office)

## 2012-01-31 NOTE — Progress Notes (Signed)
[facility] Progress Note    POCAHONTAS CRUS [id number] DOB: 06-Dec-1934 DOA: 01/25/2012 PCP: Sherian Maroon, MD  Brief narrative: 77 y.o. female with known CAD s/p DES last year on Effient, HTN, Hyperlipidemia, DM, COPD, s/p gastric bypass, known cholelithiasis, presented to the ER originally with epigastric pain, then had transient chest pain in the ER, with diaphoresis, lightheadedness, but no pleuritic CP or SOB. Evaluation in the ER showed new RBBB, normal lipase, lactic acid of 2.6, normal LFTs, Cr of 1.47, and elevated troponin to 1.1. She was found to be hypotensive with SBP of 70 (although the BP cuff was very small and was placed on her right forearm). She was given IVF and BP came up to 100. Her Abdominal CT showed cholelithiasis, no acute cholecystitis, and no acute process. Found to have a PE.   Assessment/Plan:  Clinically diagnosed PE / confirmed RLE DVT dDimer markedly elevated - confirmed LE DVT - troponin elevated - R Heart strain on echo - feel pt clearly has experienced PE - will need minimum of 8months of anticoag - though given obesity as a major risk factor, and in light of fact that this risk factor will not be "reversible," I feel that lifelong anticoag is in her best interest. Patient received iv heparin and coumadin overlap for 72 hours . Heparin was changed to lovenox on 01/29/12   Hypotension due to cardiogenic shock in the setting of PE BP has stabilized - pt is not tachycardic - felt to be due to PE  Acute on chronic diastolic chf - stopped ivf , started iv lasix 9/23   Abdominal pain - w/hx of chronic abdom pain CT scan w/o findings - procalcitonin not c/w severe infection - LFTs not suggestive of obstructive picture - lipase normal - has hx of chronic constipation w/ noncompliance w/ bowel regimen - [place] unrevealing - no further w/u indicated as inpatient . Probable due constipation . LOC and success by 9/25  Leukocytosis WBC now improving - UA  negative - afeb - likely due to stress demargination. Resolved by 9/24  Chest pain / Coronary artery disease s/p IMI 2004 tx with BMS to RCA; myoview 1/07: EF 49%, inf scar, no isch; s/p Promus DES to RCA 2/12 (cath: LM 20-30%, pLAD 20-30%, mLAD 50%, RI 30%, pRCA 60%, mRCA 90% - tx with PCI). Echo (6/12): EF 60%, mild LVH - troponin appears to have peaked - . no further cardiac w/u for now    OHS/OSA Will need outpt sleep study to evaluate for CPAP   Chronic kidney disease - Stage 3 Baseline crt appears to be ~1.3-1.5 - stable renal function at this time  COPD Well compensated at present   Normocytic anemia Baseline Hgb appears to be ~10 - follow w/ ongoing anticoag - no obvious source of blood loss at this time  morbid obesity  DM2 Poorly controlled - adjust tx plan and follow  Bilateral feet pain - probably combination of plantar fasciitis and gout   Code Status: Full Disposition Plan:home  Consultants: [facility]   HPI/Subjective: Dyspnea better, feet pain better    Objective: Blood pressure 144/82, pulse 94, temperature 97.7 F (36.5 C), temperature source Oral, resp. rate 20, height 5\' 7"  (1.702 m), weight 152.136 kg (335 lb 6.4 oz), SpO2 99.00%.  Intake/Output Summary (Last 24 hours) at 01/31/12 1606 Last data filed at 01/31/12 1400  Gross per 24 hour  Intake    360 ml  Output   3650 ml  Net  -  3290 ml   Patient Vitals for the past 24 hrs:  BP Temp Temp src Pulse Resp SpO2  01/31/12 1403 - - - 94  20  99 %  01/31/12 1331 144/82 mmHg 97.7 F (36.5 C) Oral 98  18  98 %  01/31/12 1041 91/53 mmHg - - 106  - -  01/31/12 0611 - - - - - 95 %  01/31/12 0500 97/68 mmHg 98.3 F (36.8 C) Oral 91  20  95 %  01/30/12 2100 114/74 mmHg 98.4 F (36.9 C) Oral 100  20  95 %     Exam: General: No acute respiratory distress at rest Lungs: distant bs th/o - no wheeze -, but clear pseudowheezing  no focal crackles Cardiovascular: distant heart sounds Abdomen:  morbidly obese - nontender, nondistended, soft, bowel sounds positive, no rebound Extremities: 2+ B LE edema wch appears to be chronic  Feet with pain in the joints and plantar area   Data Reviewed: Basic Metabolic Panel:  Lab [id number] 01/30/12 0012 01/29/12 0530 01/27/12 0501 01/26/12 0306  NA 137 136 136 140 133*  K 4.5 4.4 4.7 5.1 5.2*  CL 100 101 104 107 100  CO2 27 25 22 24 20   GLUCOSE 173* 171* 174* 180* 214*  BUN 14 13 12 17 20   CREATININE 1.39* 1.25* 1.16* 1.31* 1.25*  CALCIUM 8.7 8.7 8.7 8.9 8.5  MG -- -- -- -- --  PHOS -- -- -- -- --   Liver Function Tests:  Lab 01/25/12 1921  AST 24  ALT 21  ALKPHOS 80  BILITOT 0.3  PROT 7.5  ALBUMIN 3.2*    Lab 01/25/12 1921  LIPASE 52  AMYLASE --   CBC:  Lab 01/30/12 0012 01/29/12 0530 01/28/12 0625 01/27/12 0501 01/26/12 0306 01/25/12 1921  WBC 10.3 11.7* 10.6* 12.2* 15.5* --  NEUTROABS -- -- -- -- -- 10.9*  HGB 8.8* 8.4* 8.5* 8.8* 9.1* --  HCT 27.7* 26.7* 27.1* 28.3* 28.2* --  MCV 86.3 86.7 86.0 86.5 85.2 --  PLT 255 231 219 241 263 --   Cardiac Enzymes:  Lab 01/26/12 1447 01/26/12 0843 01/26/12 0326 01/26/12 0110 01/25/12 1921  CKTOTAL -- -- -- 116 --  CKMB -- -- -- 8.2* --  CKMBINDEX -- -- -- -- --  TROPONINI 1.05* 2.38* 2.60* 1.34* 1.14*   BNP (last 3 results)  Basename 01/26/12 0110  PROBNP 382.5   CBG:  Lab 01/31/12 1131 01/31/12 0734 01/30/12 2107 01/30/12 1701 01/30/12 1152  GLUCAP 162* 157* 168* 204* 183*    Recent Results (from the past 240 hour(s))  CULTURE, BLOOD (ROUTINE X 2)     Status: Normal (Preliminary result)   Collection Time   01/26/12  3:15 AM      Component Value Range Status Comment   Specimen Description BLOOD LEFT HAND   Final    Special Requests BOTTLES DRAWN AEROBIC AND ANAEROBIC 5CC   Final    Culture  Setup Time 01/26/2012 09:27   Final    Culture     Final    Value:        BLOOD CULTURE RECEIVED NO GROWTH TO DATE CULTURE WILL BE HELD FOR 5 DAYS BEFORE ISSUING A  FINAL NEGATIVE REPORT   Report Status PENDING   Incomplete   MRSA PCR SCREENING     Status: Normal   Collection Time   01/26/12  3:16 AM      Component Value Range Status Comment   MRSA by  PCR NEGATIVE  NEGATIVE Final   CULTURE, BLOOD (ROUTINE X 2)     Status: Normal (Preliminary result)   Collection Time   01/26/12  3:27 AM      Component Value Range Status Comment   Specimen Description BLOOD LEFT ARM   Final    Special Requests BOTTLES DRAWN AEROBIC AND ANAEROBIC 5CC   Final    Culture  Setup Time 01/26/2012 09:27   Final    Culture     Final    Value:        BLOOD CULTURE RECEIVED NO GROWTH TO DATE CULTURE WILL BE HELD FOR 5 DAYS BEFORE ISSUING A FINAL NEGATIVE REPORT   Report Status PENDING   Incomplete   URINE CULTURE     Status: Normal   Collection Time   01/26/12  6:00 AM      Component Value Range Status Comment   Specimen Description URINE, CLEAN CATCH   Final    Special Requests NONE   Final    Culture  Setup Time 01/26/2012 09:32   Final    Colony Count 50,000 COLONIES/ML   Final    Culture     Final    Value: Multiple bacterial morphotypes present, none predominant. Suggest appropriate recollection if clinically indicated.   Report Status 01/27/2012 FINAL   Final   URINE CULTURE     Status: Normal   Collection Time   01/26/12  4:44 PM      Component Value Range Status Comment   Specimen Description URINE, CATHETERIZED   Final    Special Requests NONE   Final    Culture  Setup Time 01/26/2012 19:28   Final    Colony Count NO GROWTH   Final    Culture NO GROWTH   Final    Report Status 01/28/2012 FINAL   Final      Studies:  Recent x-ray studies have been reviewed in detail by the Attending Physician  Scheduled Meds:     . acetaminophen  500 mg Oral QID  . antiseptic oral rinse  15 mL Mouth Rinse BID  . aspirin  81 mg Oral Daily  . clonazePAM  1 mg Oral q morning - 10a  . colchicine  0.6 mg Oral BID  . docusate sodium  300 mg Oral Daily  . DULoxetine  60  mg Oral Q breakfast  . enoxaparin (LOVENOX) injection  150 mg Subcutaneous Q12H  . exenatide  10 mcg Subcutaneous BID WC  . insulin aspart  0-20 Units Subcutaneous TID WC  . insulin aspart  0-5 Units Subcutaneous QHS  . insulin glargine  20 Units Subcutaneous QHS  . isosorbide mononitrate  60 mg Oral Daily  . lactobacillus acidophilus  1 tablet Oral Daily  . levalbuterol  0.63 mg Nebulization Q8H  . metoprolol succinate  12.5 mg Oral Daily  . nystatin  1 g Topical TID  . nystatin cream  1 application Topical TID  . pantoprazole  40 mg Oral BID WC  . senna-docusate  2 tablet Oral QHS  . simvastatin  10 mg Oral q1800  . warfarin  10 mg Oral ONCE-1800  . warfarin  7.5 mg Oral q1800  . Warfarin - Pharmacist Dosing Inpatient   Does not apply q1800  . DISCONTD: warfarin   Does not apply Once      [name] [id number] On-Call/Text Page:      [email or web address]      password [email or web address]  If 7PM-7AM, please contact night-coverage [email or web address]  Password [email or web address] 01/31/2012, 4:06 PM   LOS: 6 days

## 2012-01-31 NOTE — Progress Notes (Deleted)
Reviewed discharge instructions with patient's niece, she stated her understanding.  Called report to nurse Delcie Roch at [facility].  Awainting PTAR to transport.  Sanda Linger

## 2012-02-01 LAB — BASIC METABOLIC PANEL
BUN: 16 mg/dL (ref 6–23)
CO2: 26 mEq/L (ref 19–32)
Calcium: 9.1 mg/dL (ref 8.4–10.5)
Chloride: 99 mEq/L (ref 96–112)
Creatinine, Ser: 1.41 mg/dL — ABNORMAL HIGH (ref 0.50–1.10)
GFR calc Af Amer: 41 mL/min — ABNORMAL LOW (ref >90)
GFR calc non Af Amer: 35 mL/min — ABNORMAL LOW (ref >90)
Glucose, Bld: 167 mg/dL — ABNORMAL HIGH (ref 70–99)
Potassium: 4.5 mEq/L (ref 3.5–5.1)
Sodium: 137 mEq/L (ref 135–145)

## 2012-02-01 LAB — CULTURE, BLOOD (ROUTINE X 2)
Culture: NO GROWTH
Culture: NO GROWTH

## 2012-02-01 LAB — PROTIME-INR
INR: 2.38 — ABNORMAL HIGH (ref 0.00–1.49)
Prothrombin Time: 24.9 seconds — ABNORMAL HIGH (ref 11.6–15.2)

## 2012-02-01 LAB — GLUCOSE, CAPILLARY
Glucose-Capillary: 172 mg/dL — ABNORMAL HIGH (ref 70–99)
Glucose-Capillary: 164 mg/dL — ABNORMAL HIGH (ref 70–99)

## 2012-02-01 MED ORDER — ACETAMINOPHEN 500 MG PO TABS: 500.0000 mg | ORAL_TABLET | Freq: Four times a day (QID) | ORAL | Status: DC

## 2012-02-01 MED ORDER — PANTOPRAZOLE SODIUM 40 MG PO TBEC: 40.0000 mg | DELAYED_RELEASE_TABLET | Freq: Two times a day (BID) | ORAL | Status: DC

## 2012-02-01 MED ORDER — WARFARIN SODIUM 5 MG PO TABS: 7.5000 mg | ORAL_TABLET | Freq: Every day | ORAL | Status: DC

## 2012-02-01 MED ORDER — EXENATIDE 10 MCG/0.04ML ~~LOC~~ SOPN: 10.0000 ug | PEN_INJECTOR | Freq: Two times a day (BID) | SUBCUTANEOUS | Status: DC

## 2012-02-01 MED ORDER — FUROSEMIDE 20 MG PO TABS: 20.0000 mg | ORAL_TABLET | Freq: Two times a day (BID) | ORAL | Status: DC

## 2012-02-01 MED ORDER — COLCHICINE 0.6 MG PO TABS: 0.6000 mg | ORAL_TABLET | Freq: Every day | ORAL | Status: DC

## 2012-02-01 MED ORDER — METOPROLOL SUCCINATE ER 25 MG PO TB24: 25.0000 mg | ORAL_TABLET | Freq: Every day | ORAL | Status: DC

## 2012-02-01 MED ORDER — ISOSORBIDE MONONITRATE ER 30 MG PO TB24: 30.0000 mg | ORAL_TABLET | Freq: Every day | ORAL | Status: DC

## 2012-02-01 MED ORDER — INSULIN GLARGINE 100 UNIT/ML ~~LOC~~ SOLN: 20.0000 [IU] | Freq: Every day | SUBCUTANEOUS | Status: DC

## 2012-02-01 MED ORDER — PRAVASTATIN SODIUM 20 MG PO TABS: 20.0000 mg | ORAL_TABLET | Freq: Every day | ORAL | Status: DC

## 2012-02-01 MED ORDER — OXYCODONE-ACETAMINOPHEN 5-325 MG PO TABS: 1.0000 | ORAL_TABLET | ORAL | Status: DC | PRN

## 2012-02-01 MED ORDER — DULOXETINE HCL 60 MG PO CPEP: 60.0000 mg | ORAL_CAPSULE | Freq: Every day | ORAL | Status: DC

## 2012-02-01 MED ORDER — PATIENT'S GUIDE TO USING COUMADIN BOOK
Freq: Once | Status: AC
  Administered 2012-02-01: 11:00:00
  Filled 2012-02-01: qty 1

## 2012-02-01 MED ORDER — SENNOSIDES-DOCUSATE SODIUM 8.6-50 MG PO TABS: 2.0000 | ORAL_TABLET | Freq: Every day | ORAL | Status: DC

## 2012-02-01 NOTE — Progress Notes (Signed)
Reviewed discharge instructions with patient and family, they stated their understanding.  Patient having [name] draw PT/INR and call to MD office.  Patient discharged via wheelchair and family.  [name]

## 2012-02-01 NOTE — Progress Notes (Signed)
Attempted to see pt for OT treatment.  Pt eating lunch and d/cing to SNF soon after.  Will defer this treatment to the SNF. Jinger Neighbors, OTR/L [phone]

## 2012-02-01 NOTE — Discharge Summary (Signed)
Physician Discharge Summary  [name] [id number] DOB: 1934-11-12 DOA: 01/25/2012  PCP: Delrae Rend, NP  Admit date: 01/25/2012 Discharge date: 02/01/2012  Recommendations for Outpatient Follow-up:  1. PT/INR - will be drawn by [facility] RN and faxed to [place] coumad[facility] clinic  Discharge Diagnoses:  Principal Problem:  *Pulmonary embolus Active Problems:  OBESITY, MORBID  ANEMIA  CORONARY ARTERY DISEASE  CHOLELITHIASIS  Hypotension  Abdominal pain  Elevated troponin  Chest pain  DVT (deep venous thrombosis)  Acute on chronic diastolic CHF (congestive heart failure), NYHA class 3  DM type 2 causing CKD stage 3   Discharge Condition: fair  Diet recommendation: carb modified   Filed Weights   01/26/12 0100 01/26/12 0315 01/29/12 0500  Weight: 148.8 kg (328 lb 0.7 oz) 154.6 kg (340 lb 13.3 oz) 152.136 kg (335 lb 6.4 oz)    History of present illness:  [name] is an 77 y.o. female with known CAD s/p DES last year, on Effient,, HTN, Hyperlipidemia, DM, COPD, s/p gastric bypass, known cholelithiasis, presents to the ER originally with epigastric pain, then had transcient chest pain in the ER, with diaphoresis, lightheadedness, but no pleuritic CP and SOB. Evaluation in the ER showed new RBBB, normal lipase, lactic acid of 2.6, normal LFTs, Cr of 1.47, and elevated troponin to 1.1. She was found to be hypotensive with SBP of 70 (although the BP cuff was very small and was placed on her right forearm). She was given IVF and BP came up to 100. Her Abdominal CT showed cholelithiasis, no acute cholecystitis, and no acute process.Eventually was found to have a PE and was admitted for further w/u   Hospital Course:  Clinically diagnosed PE / confirmed RLE DVT  D - Dimer markedly elevated - confirmed LE DVT - troponin elevated - R Heart strain on echo - feel pt clearly has experienced PE - will need minimum of 51months of anticoag - though given obesity as a major risk  factor, and in light of fact that this risk factor will not be "reversible," I feel that lifelong anticoag is in her best interest. Patient received iv heparin and coumadin overlap for 72 hours . Heparin was changed to lovenox on 01/29/12 . Plan for coumadin for at least 1 year - to be monitored at [place] [facility] Hypotension due to cardiogenic shock in the setting of PE  BP has stabilized - pt is not tachycardic - felt to be due to PE . We were even able to resume low dose imdur and BB. dced acei for now.  Acute on chronic diastolic chf - stopped ivf , started iv lasix 9/23  Abdominal pain - w/hx of chronic abdom pain  CT scan w/o findings - procalcitonin not c/w severe infection - LFTs not suggestive of obstructive picture - lipase normal - has hx of chronic constipation w/ noncompliance w/ bowel regimen - [place] unrevealing - no further w/u indicated as inpatient . Probable due constipation . LOC and success by 9/25  Leukocytosis  Probable demargination WBC improved - UA negative - afeb - likely due to stress demargination. Leukocytosis  Resolved by 9/24  Chest pain / Coronary artery disease  s/p IMI 2004 tx with BMS to RCA; myoview 1/07: EF 49%, inf scar, no isch; s/p Promus DES to RCA 2/12 (cath: LM 20-30%, pLAD 20-30%, mLAD 50%, RI 30%, pRCA 60%, mRCA 90% - tx with PCI). Echo (6/12): EF 60%, mild LVH - patient was taken off Effient  to be able to start coumadin by [facility] cardiology  OHS/OSA  Will need outpt sleep study to evaluate for CPAP  Chronic kidney disease - Stage 3  Baseline crt appears to be ~1.3-1.5 - stable renal function at this time  COPD  Well compensated at present  Normocytic anemia - due to CKD, chronic ds.  Baseline Hgb appears to be 8-9 - follow w/ ongoing anticoag. Patient did not have active bleeding or precipitous hemoglobin drop during admission. FOB negative 08/13  morbid obesity  DM2  Poorly controlled - to resume lantus and byetta at DC Bilateral feet pain -  probably combination of plantar fasciitis and gout . Improved with colchicine and tylenol. Avoiding nsaids due to CKD   Procedures:  V/Q scan  LE venous dopplers  Consultations:  Cardiology   Discharge Exam: Filed Vitals:   01/31/12 1403 01/31/12 2100 02/01/12 0300 02/01/12 1054  BP:  149/81 135/71 139/76  Pulse: 94 98 93 95  Temp:  97.6 F (36.4 C) 98.2 F (36.8 C)   TempSrc:  Oral Oral   Resp: 20 17 16    Height:      Weight:      SpO2: 99% 91% 94%     General: axox3 Cardiovascular: RRR, distant  Respiratory: ctab  Morbid obesity  Le edema    Discharge Instructions      Discharge Orders    Future Orders Please Complete By Expires   Diet - low sodium heart healthy      Increase activity slowly          Medication List     As of 02/01/2012 12:12 PM    STOP taking these medications         EFFIENT 10 MG Tabs   Generic drug: prasugrel      lisinopril 10 MG tablet   Commonly known as: PRINIVIL,ZESTRIL      metFORMIN 850 MG tablet   Commonly known as: GLUCOPHAGE      naproxen sodium 220 MG tablet   Commonly known as: ANAPROX      oxyCODONE-acetaminophen 10-325 MG per tablet   Commonly known as: PERCOCET      TAKE these medications         acetaminophen 500 MG tablet   Commonly known as: TYLENOL   Take 1 tablet (500 mg total) by mouth 4 (four) times daily.      aspirin 81 MG chewable tablet   Chew 81 mg by mouth daily.      clonazePAM 1 MG tablet   Commonly known as: KLONOPIN   Take 1 mg by mouth every morning.      colchicine 0.6 MG tablet   Take 1 tablet (0.6 mg total) by mouth daily.      DULoxetine 60 MG capsule   Commonly known as: CYMBALTA   Take 1 capsule (60 mg total) by mouth daily.      exenatide 10 MCG/0.04ML Soln   Commonly known as: BYETTA   Inject 0.04 mLs (10 mcg total) into the skin 2 (two) times daily with a meal.      furosemide 20 MG tablet   Commonly known as: LASIX   Take 1 tablet (20 mg total) by mouth 2  (two) times daily.      insulin glargine 100 UNIT/ML injection   Commonly known as: LANTUS   Inject 20 Units into the skin at bedtime.      isosorbide mononitrate 30 MG 24 hr tablet   Commonly  known as: IMDUR   Take 1 tablet (30 mg total) by mouth daily.      metoprolol succinate 25 MG 24 hr tablet   Commonly known as: TOPROL-XL   Take 1 tablet (25 mg total) by mouth daily.      nitroGLYCERIN 0.4 MG SL tablet   Commonly known as: NITROSTAT   Place 0.4 mg under the tongue every 5 (five) minutes as needed. For chest pain      oxyCODONE-acetaminophen 5-325 MG per tablet   Commonly known as: PERCOCET/ROXICET   Take 1 tablet by mouth every 4 (four) hours as needed for pain.      pantoprazole 40 MG tablet   Commonly known as: PROTONIX   Take 1 tablet (40 mg total) by mouth 2 (two) times daily.      pravastatin 20 MG tablet   Commonly known as: PRAVACHOL   Take 1 tablet (20 mg total) by mouth daily.      senna-docusate 8.6-50 MG per tablet   Commonly known as: Senokot-S   Take 2 tablets by mouth at bedtime.      warfarin 5 MG tablet   Commonly known as: COUMADIN   Take 1.5 tablets (7.5 mg total) by mouth daily at 6 PM.        Follow-up Information    Follow up with Delrae Rend, NP. On 02/15/2012. (10:45 am)    Contact information:   [address] [place] [address] [phone]           The results of significant diagnostics from this hospitalization (including imaging, microbiology, ancillary and laboratory) are listed below for reference.    Significant Diagnostic Studies: US Abdomen Complete  01/26/2012  *RADIOLOGY REPORT*  Clinical Data:  Chest and abdomen pain.  Known cholelithiasis.  COMPLETE ABDOMINAL ULTRASOUND  Comparison:  CT obtained yesterday.  Findings:  Gallbladder:  Multiple shadowing gallstones filling the gallbladder.  The largest individual stone measures 9 mm in maximum diameter.  No gallbladder wall thickening or pericholecystic  fluid. The patient was not focally tender over the gallbladder.  Common bile duct:  Normal in caliber, measuring 4.3 mm in diameter proximally.  Liver:  Mildly echogenic and heterogeneous.  Corresponding low density on the recent CT.  IVC:  Appears normal.  Pancreas:  Poorly visualized head and tail.  The visualized portions appear normal.  Spleen:  Normal, measuring 6.7 cm in length.  Right Kidney:  Multiple cysts, 4.4 cm cyst containing a thin internal septation in the mid right kidney.  There is also a 5.2 cm cyst in the upper pole.  Otherwise, normal, measuring 12.5 cm in length.  Left Kidney:  2.2 cm cyst in the mid left kidney.  Otherwise, normal, measuring 11.8 cm in length.  Abdominal aorta:  Poorly visualized.  The visualized portions are normal in caliber.  IMPRESSION:  1.  Cholelithiasis without evidence of cholecystitis. 2.  Bilateral renal cysts. 3.  Mild diffuse hepatic steatosis.   Original Report Authenticated By: Gerald Stabs, M.D.    Ct Abdomen Pelvis W Contrast  01/25/2012  *RADIOLOGY REPORT*  Clinical Data: Epigastric pain.  Nausea and vomiting.  CT ABDOMEN AND PELVIS WITH CONTRAST  Technique:  Multidetector CT imaging of the abdomen and pelvis was performed following the standard protocol during bolus administration of intravenous contrast.  Contrast: 52mL OMNIPAQUE IOHEXOL 300 MG/ML  SOLN  Comparison: CT abdomen and pelvis 05/28/2010.  Findings: The lung bases are clear.  No pleural or pericardial effusion.  Coronary artery stents are noted.  There is a small hiatal hernia with postoperative change seen at the gastroesophageal junction.  As on the prior study, gallstones are identified measuring up to 2 cm in diameter.  No CT evidence of cholecystitis.  The liver is low attenuating consistent with fatty infiltration.  A small capsular surface calcification is unchanged.  No intraparenchymal lesion is identified.  No biliary ductal dilatation.  The spleen, adrenal glands and pancreas are  unremarkable.  Bilateral renal cysts are unchanged.  The patient is status post prior ventral hernia repair and gastric bypass.  Surgical anastomosis in the distal ileum in the right lower quadrant is again seen.  Small bowel is otherwise unremarkable.  The colon appears normal.  The appendix has been removed.  There is no lymphadenopathy or fluid.  Uterus, adnexa and urinary bladder all appear normal.  There is no focal bony abnormality with multilevel lumbar degenerative change identified.  IMPRESSION:  1.  No acute finding or finding to explain the patient's symptoms. 2.  Postoperative change as described. 3.  Gallstones without evidence of cholecystitis. 4.  Fatty infiltration of the liver.   Original Report Authenticated By: Arvid Right. D'ALESSIO, M.D.    Nm Pulmonary Perf And Vent  01/26/2012  *RADIOLOGY REPORT*  Clinical Data:  Chest pain, obesity, immobilization, history hypertension, diabetes, COPD, coronary artery disease  NUCLEAR MEDICINE VENTILATION - PERFUSION LUNG SCAN  Technique:  Wash-in, equilibrium, and wash-out phase ventilation images were obtained using Xe-133 gas.  Perfusion images were obtained in multiple projections after intravenous injection of Tc- 35m MAA.  Radiopharmaceuticals:  3 mCi Xe-133 gas and 17 mCi Tc-63m MAA.  Comparison:  None Correlation:  Chest radiograph 01/25/2012  Findings: Image quality degraded by body habitus. Ventilation exam demonstrates generally diminished ventilation throughout the entirety of the left lung versus right. Mild elevation of right diaphragm. No significant xenon retention.  Perfusion lung scan in eight projections demonstrates markedly impaired perfusion to the left lung, mildly decreased in left lower lobe and nearly absent in left upper lobe. Questionable perfusion defect in the anterior right upper lobe on the RAO view is not well visualized on remaining views. No definite ventilatory abnormalities seen at the right upper lobe on ventilation exam.  Patient appears rotated on perfusion images.  Accompanying chest radiograph demonstrates mild elevation of right diaphragm, mild peribronchial thickening and emphysematous changes.  Findings represent an intermediate probability pulmonary embolism.  IMPRESSION: Intermediate probability for pulmonary embolism.   Original Report Authenticated By: Burnetta Sabin, M.D.    Dg Chest Port 1 View  01/25/2012  *RADIOLOGY REPORT*  Clinical Data: Shortness of breath.  Chest pain.  PORTABLE CHEST - 1 VIEW  Comparison: One view chest 12/31/2011.  Findings: Low lung volumes exaggerate the heart size.  There is no edema or effusion to suggest failure.  No focal airspace disease evident.  The visualized soft tissues and bony thorax are unremarkable.  IMPRESSION:  1.  Low lung volumes. 2.  No acute cardiopulmonary disease.   Original Report Authenticated By: Resa Miner. MATTERN, M.D.     Microbiology: Recent Results (from the past 240 hour(s))  CULTURE, BLOOD (ROUTINE X 2)     Status: Normal   Collection Time   01/26/12  3:15 AM      Component Value Range Status Comment   Specimen Description BLOOD LEFT HAND   Final    Special Requests BOTTLES DRAWN AEROBIC AND ANAEROBIC 5CC   Final    Culture  Setup Time 01/26/2012 09:27   Final    Culture NO GROWTH 5 DAYS   Final    Report Status 02/01/2012 FINAL   Final   MRSA PCR SCREENING     Status: Normal   Collection Time   01/26/12  3:16 AM      Component Value Range Status Comment   MRSA by PCR NEGATIVE  NEGATIVE Final   CULTURE, BLOOD (ROUTINE X 2)     Status: Normal   Collection Time   01/26/12  3:27 AM      Component Value Range Status Comment   Specimen Description BLOOD LEFT ARM   Final    Special Requests BOTTLES DRAWN AEROBIC AND ANAEROBIC 5CC   Final    Culture  Setup Time 01/26/2012 09:27   Final    Culture NO GROWTH 5 DAYS   Final    Report Status 02/01/2012 FINAL   Final   URINE CULTURE     Status: Normal   Collection Time   01/26/12  6:00 AM       Component Value Range Status Comment   Specimen Description URINE, CLEAN CATCH   Final    Special Requests NONE   Final    Culture  Setup Time 01/26/2012 09:32   Final    Colony Count 50,000 COLONIES/ML   Final    Culture     Final    Value: Multiple bacterial morphotypes present, none predominant. Suggest appropriate recollection if clinically indicated.   Report Status 01/27/2012 FINAL   Final   URINE CULTURE     Status: Normal   Collection Time   01/26/12  4:44 PM      Component Value Range Status Comment   Specimen Description URINE, CATHETERIZED   Final    Special Requests NONE   Final    Culture  Setup Time 01/26/2012 19:28   Final    Colony Count NO GROWTH   Final    Culture NO GROWTH   Final    Report Status 01/28/2012 FINAL   Final      Labs: Basic Metabolic Panel:  Lab [id number] 01/31/12 0540 01/30/12 0012 01/29/12 0530 01/27/12 0501  NA 137 137 136 136 140  K 4.5 4.5 4.4 4.7 5.1  CL 99 100 101 104 107  CO2 26 27 25 22 24   GLUCOSE 167* 173* 171* 174* 180*  BUN 16 14 13 12 17   CREATININE 1.41* 1.39* 1.25* 1.16* 1.31*  CALCIUM 9.1 8.7 8.7 8.7 8.9  MG -- -- -- -- --  PHOS -- -- -- -- --   Liver Function Tests:  Lab 01/25/12 1921  AST 24  ALT 21  ALKPHOS 80  BILITOT 0.3  PROT 7.5  ALBUMIN 3.2*    Lab 01/25/12 1921  LIPASE 52  AMYLASE --   No results found for this basename: AMMONIA:5 in the last 168 hours CBC:  Lab 01/30/12 0012 01/29/12 0530 01/28/12 0625 01/27/12 0501 01/26/12 0306 01/25/12 1921  WBC 10.3 11.7* 10.6* 12.2* 15.5* --  NEUTROABS -- -- -- -- -- 10.9*  HGB 8.8* 8.4* 8.5* 8.8* 9.1* --  HCT 27.7* 26.7* 27.1* 28.3* 28.2* --  MCV 86.3 86.7 86.0 86.5 85.2 --  PLT 255 231 219 241 263 --   Cardiac Enzymes:  Lab 01/26/12 1447 01/26/12 0843 01/26/12 0326 01/26/12 0110 01/25/12 1921  CKTOTAL -- -- -- 116 --  CKMB -- -- -- 8.2* --  CKMBINDEX -- -- -- -- --  TROPONINI 1.05*  2.38* 2.60* 1.34* 1.14*   BNP: BNP (last 3 results)  Basename  01/26/12 0110  PROBNP 382.5   CBG:  Lab 02/01/12 1206 02/01/12 0756 01/31/12 2013 01/31/12 1702 01/31/12 1131  GLUCAP 164* 172* 186* 191* 162*    Time coordinating discharge: 50 minutes  Signed:  Caleel Kiner  [facility] 02/01/2012, 12:12 PM

## 2012-02-02 ENCOUNTER — Telehealth: Payer: Self-pay | Admitting: Nurse Practitioner

## 2012-02-02 ENCOUNTER — Ambulatory Visit: Payer: Self-pay | Admitting: Cardiology

## 2012-02-02 DIAGNOSIS — Z7901 Long term (current) use of anticoagulants: Secondary | ICD-10-CM | POA: Insufficient documentation

## 2012-02-02 DIAGNOSIS — I2699 Other pulmonary embolism without acute cor pulmonale: Secondary | ICD-10-CM

## 2012-02-02 LAB — POCT INR: INR: 3.3

## 2012-02-02 NOTE — Telephone Encounter (Signed)
This is not a brassfiled pt

## 2012-02-02 NOTE — Telephone Encounter (Signed)
Caller: [place] RN; Patient Name: [name]; PCP: Hassell Done per Epic;  Bluford Kaufmann per caller [facility]); Best Callback Phone Number: [phone].  Caller checking to see if PT/INR orders needed, or if done in office etc.  Patient is on coumadin 705mg  daily.  States discharged from hospital 02/01/12 but has no follow up orders.  May reach RN at [phone].

## 2012-02-02 NOTE — Patient Instructions (Addendum)

## 2012-02-02 NOTE — Telephone Encounter (Signed)
Advised home health nurse that patient no longer under Dr. Raliegh Ip care.  Last ov was 05/2010, pt transferred care to [place].

## 2012-02-06 ENCOUNTER — Ambulatory Visit: Payer: Self-pay | Admitting: Cardiovascular Disease

## 2012-02-06 DIAGNOSIS — Z7901 Long term (current) use of anticoagulants: Secondary | ICD-10-CM

## 2012-02-06 DIAGNOSIS — I2699 Other pulmonary embolism without acute cor pulmonale: Secondary | ICD-10-CM

## 2012-02-06 LAB — POCT INR: INR: 3.4

## 2012-02-13 ENCOUNTER — Ambulatory Visit: Payer: Self-pay | Admitting: Internal Medicine

## 2012-02-13 DIAGNOSIS — Z7901 Long term (current) use of anticoagulants: Secondary | ICD-10-CM

## 2012-02-13 DIAGNOSIS — I2699 Other pulmonary embolism without acute cor pulmonale: Secondary | ICD-10-CM

## 2012-02-13 LAB — POCT INR: INR: 3

## 2012-02-20 ENCOUNTER — Ambulatory Visit: Payer: Self-pay | Admitting: Cardiology

## 2012-02-20 DIAGNOSIS — I2699 Other pulmonary embolism without acute cor pulmonale: Secondary | ICD-10-CM

## 2012-02-20 DIAGNOSIS — Z7901 Long term (current) use of anticoagulants: Secondary | ICD-10-CM

## 2012-02-20 LAB — POCT INR: INR: 2.2

## 2012-02-21 ENCOUNTER — Telehealth: Payer: Self-pay | Admitting: Cardiology

## 2012-02-21 NOTE — Telephone Encounter (Signed)
Pt needs to have some teethe pulled and she needs pre medication

## 2012-02-21 NOTE — Telephone Encounter (Signed)
SPOKE WITH PT AND PT'S DAUGHTER   NEEDING TEETH PULLED  WAS RECENTLY STARTED ON COUMADIN  FOR DVT AND  PE   INSTRUCTED  MAY NOT BE ABLE TO HOLD  MED THIS SOON  PT AWARE  WILL FROWARD TO DR WALL FOR REVIEW/CY

## 2012-02-22 NOTE — Telephone Encounter (Signed)
I talked with [facility] this morning re: pt follow-up appt.  Pt  was no longer a patient there. Spoke with her daughter, [name], who states mother see a [facility] doctor off of [facility]. Asked that I call her sister, [name], who is at pt's house.  Pt was in the hospital for 8 days with discharge on the 9.26/13 with DVT/?PE &  had follow-up appt. With pcp I spoke with pt and daughter [name] this morning. Pt saw Dr.  [name] on 02/15/12 and is her new pcp. All of this was to see how the patient was doing and find obtain more information about dental needs.  Pt states she will be needing 2 teeth pulled in the immediate future by Dr. Ladona Horns. She is aware she needs to continue coumadin  Also spoke with [name] about this as pt had dvt/?PE while in the hosp[facility] (INR 2.2 on 02/20/12).l. Recommended continuing coumadin.  Talked with Dr. Ladona Horns about pt needing to remain on Coumadin for 2 front teeth removal.   I will forward this to Dr. [name] for review. Horton Chin RN

## 2012-02-28 DIAGNOSIS — E119 Type 2 diabetes mellitus without complications: Secondary | ICD-10-CM

## 2012-02-28 DIAGNOSIS — I749 Embolism and thrombosis of unspecified artery: Secondary | ICD-10-CM

## 2012-02-28 DIAGNOSIS — I2699 Other pulmonary embolism without acute cor pulmonale: Secondary | ICD-10-CM

## 2012-02-28 DIAGNOSIS — I5033 Acute on chronic diastolic (congestive) heart failure: Secondary | ICD-10-CM

## 2012-03-06 ENCOUNTER — Ambulatory Visit: Payer: Self-pay | Admitting: Internal Medicine

## 2012-03-06 DIAGNOSIS — Z7901 Long term (current) use of anticoagulants: Secondary | ICD-10-CM

## 2012-03-06 DIAGNOSIS — I2699 Other pulmonary embolism without acute cor pulmonale: Secondary | ICD-10-CM

## 2012-03-06 LAB — POCT INR: INR: 3.2

## 2012-03-13 ENCOUNTER — Ambulatory Visit: Payer: Self-pay | Admitting: Cardiology

## 2012-03-13 DIAGNOSIS — I2699 Other pulmonary embolism without acute cor pulmonale: Secondary | ICD-10-CM

## 2012-03-13 DIAGNOSIS — Z7901 Long term (current) use of anticoagulants: Secondary | ICD-10-CM

## 2012-03-13 LAB — POCT INR: INR: 3.5

## 2012-03-20 ENCOUNTER — Ambulatory Visit: Payer: Self-pay | Admitting: Cardiology

## 2012-03-20 DIAGNOSIS — I2699 Other pulmonary embolism without acute cor pulmonale: Secondary | ICD-10-CM

## 2012-03-20 DIAGNOSIS — Z7901 Long term (current) use of anticoagulants: Secondary | ICD-10-CM

## 2012-03-20 LAB — POCT INR: INR: 2.3

## 2012-04-01 ENCOUNTER — Ambulatory Visit (INDEPENDENT_AMBULATORY_CARE_PROVIDER_SITE_OTHER): Payer: Medicare Other | Admitting: *Deleted

## 2012-04-01 DIAGNOSIS — I2699 Other pulmonary embolism without acute cor pulmonale: Secondary | ICD-10-CM

## 2012-04-01 DIAGNOSIS — Z7901 Long term (current) use of anticoagulants: Secondary | ICD-10-CM

## 2012-04-01 LAB — POCT INR: INR: 1.9

## 2012-04-15 ENCOUNTER — Ambulatory Visit (INDEPENDENT_AMBULATORY_CARE_PROVIDER_SITE_OTHER): Payer: Medicare Other | Admitting: *Deleted

## 2012-04-15 DIAGNOSIS — Z7901 Long term (current) use of anticoagulants: Secondary | ICD-10-CM

## 2012-04-15 DIAGNOSIS — I2699 Other pulmonary embolism without acute cor pulmonale: Secondary | ICD-10-CM

## 2012-04-15 LAB — POCT INR: INR: 1.2

## 2012-04-22 ENCOUNTER — Ambulatory Visit (INDEPENDENT_AMBULATORY_CARE_PROVIDER_SITE_OTHER): Payer: Medicare Other | Admitting: *Deleted

## 2012-04-22 DIAGNOSIS — I2699 Other pulmonary embolism without acute cor pulmonale: Secondary | ICD-10-CM

## 2012-04-22 DIAGNOSIS — Z7901 Long term (current) use of anticoagulants: Secondary | ICD-10-CM

## 2012-04-22 LAB — POCT INR: INR: 2.5

## 2012-05-13 LAB — HEPATIC FUNCTION PANEL
ALT: 18 U/L (ref 7–35)
AST: 12 U/L — AB (ref 13–35)
Alkaline Phosphatase: 86 U/L (ref 25–125)
Bilirubin, Total: 0.2 mg/dL

## 2012-05-13 LAB — LIPID PANEL
Cholesterol: 136 mg/dL (ref 0–200)
HDL: 39 mg/dL (ref 35–70)
LDL Cholesterol: 65 mg/dL
LDl/HDL Ratio: 1.7
Triglycerides: 160 mg/dL (ref 40–160)

## 2012-05-13 LAB — BASIC METABOLIC PANEL
BUN: 28 mg/dL — AB (ref 4–21)
Creatinine: 1.4 mg/dL — AB (ref 0.5–1.1)
Glucose: 244 mg/dL
Potassium: 4.4 mmol/L (ref 3.4–5.3)
Sodium: 137 mmol/L (ref 137–147)

## 2012-05-13 LAB — HEMOGLOBIN A1C: Hgb A1c MFr Bld: 11.6 % — AB (ref 4.0–6.0)

## 2012-05-13 LAB — CBC AND DIFFERENTIAL
HCT: 31 % — AB (ref 36–46)
Hemoglobin: 9.8 g/dL — AB (ref 12.0–16.0)
Neutrophils Absolute: 4 /uL
Platelets: 321 10*3/uL (ref 150–399)

## 2012-05-15 ENCOUNTER — Encounter: Payer: Self-pay | Admitting: Cardiology

## 2012-05-15 ENCOUNTER — Ambulatory Visit (INDEPENDENT_AMBULATORY_CARE_PROVIDER_SITE_OTHER): Payer: Medicare Other | Admitting: *Deleted

## 2012-05-15 ENCOUNTER — Ambulatory Visit (INDEPENDENT_AMBULATORY_CARE_PROVIDER_SITE_OTHER): Payer: Medicare Other | Admitting: Cardiology

## 2012-05-15 VITALS — BP 126/62 | HR 72 | Ht 67.0 in | Wt 319.0 lb

## 2012-05-15 DIAGNOSIS — I2699 Other pulmonary embolism without acute cor pulmonale: Secondary | ICD-10-CM

## 2012-05-15 DIAGNOSIS — I509 Heart failure, unspecified: Secondary | ICD-10-CM

## 2012-05-15 DIAGNOSIS — I10 Essential (primary) hypertension: Secondary | ICD-10-CM

## 2012-05-15 DIAGNOSIS — I2510 Atherosclerotic heart disease of native coronary artery without angina pectoris: Secondary | ICD-10-CM

## 2012-05-15 DIAGNOSIS — Z7901 Long term (current) use of anticoagulants: Secondary | ICD-10-CM

## 2012-05-15 DIAGNOSIS — I5032 Chronic diastolic (congestive) heart failure: Secondary | ICD-10-CM

## 2012-05-15 LAB — POCT INR: INR: 3.9

## 2012-05-15 NOTE — Progress Notes (Signed)
HPI [name] comes in today for evaluation and management of her chronic coronary artery disease. She denies any angina or ischemic symptoms. She's had atypical chest pain the past with a negative evaluation. She has had previous inferior Rainer Mounce MI 2004. She's had a bare-metal stent as well as a drug-eluting stent to the right coronary artery. Last catheterization showed nonobstructive disease.  Her biggest complaint today is her knees hurt so bad she can't walk on. He is pretty much confined to wheelchair. She has been extensively evaluated by Dr. Theda Sers orthopedics but is felt to not be a candidate for surgery. He certainly high risk from my standpoint. She denies any orthopnea, PND or edema.  She also has a history of chronic diastolic heart failure, history of DVT and pulmonary embolus on anticoagulation, and morbid obesity. Past Medical History  Diagnosis Date  . COPD (chronic obstructive pulmonary disease)     patient denies this  . Diabetes mellitus   . Diverticular disease   . Hypertension   . Hyperlipidemia   . Coronary artery disease     a. s/p IMI 2004 tx with BMS to RCA;  b. s/p Promus DES to RCA 2/12 (cath: LM 20-30%, pLAD 20-30%, mLAD 50%, RI 30%, pRCA 60%, mRCA 90% - tx with PCI);  c. myoview 1/07: EF 49%, inf scar, no isch  . Urinary incontinence   . Insomnia   . Osteoarthritis   . Morbid obesity   . Obstructive sleep apnea   . Polymyalgia rheumatica   . Anxiety   . Arthritis   . Depression   . Cholelithiasis     Current Outpatient Prescriptions  Medication Sig Dispense Refill  . acetaminophen (TYLENOL) 500 MG tablet Take 1 tablet (500 mg total) by mouth 4 (four) times daily.  30 tablet    . aspirin 81 MG chewable tablet Chew 81 mg by mouth daily.      . clonazePAM (KLONOPIN) 1 MG tablet Take 1 mg by mouth every morning.      . colchicine 0.6 MG tablet Take 1 tablet (0.6 mg total) by mouth daily.  30 tablet  0  . DULoxetine (CYMBALTA) 60 MG capsule Take 1 capsule  (60 mg total) by mouth daily.  30 capsule  0  . exenatide (BYETTA) 10 MCG/0.04ML SOLN Inject 0.04 mLs (10 mcg total) into the skin 2 (two) times daily with a meal.  1.2 mL  0  . furosemide (LASIX) 20 MG tablet Take 1 tablet (20 mg total) by mouth 2 (two) times daily.  30 tablet  0  . insulin glargine (LANTUS) 100 UNIT/ML injection Inject 30 Units into the skin 2 (two) times daily.      . isosorbide mononitrate (IMDUR) 30 MG 24 hr tablet Take 1 tablet (30 mg total) by mouth daily.  30 tablet  0  . metoprolol succinate (TOPROL-XL) 25 MG 24 hr tablet Take 1 tablet (25 mg total) by mouth daily.  30 tablet  0  . nitroGLYCERIN (NITROSTAT) 0.4 MG SL tablet Place 0.4 mg under the tongue every 5 (five) minutes as needed. For chest pain      . oxyCODONE-acetaminophen (PERCOCET/ROXICET) 5-325 MG per tablet Take 1 tablet by mouth every 4 (four) hours as needed for pain.  30 tablet  0  . pantoprazole (PROTONIX) 40 MG tablet Take 1 tablet (40 mg total) by mouth 2 (two) times daily.  60 tablet  0  . pravastatin (PRAVACHOL) 20 MG tablet Take 1 tablet (20 mg total)  by mouth daily.  30 tablet  0  . senna-docusate (SENOKOT-S) 8.6-50 MG per tablet Take 2 tablets by mouth at bedtime.      [name] warfarin (COUMADIN) 5 MG tablet Take 1.5 tablets (7.5 mg total) by mouth daily at 6 PM.  45 tablet  0    Allergies  Allergen Reactions  . Codeine Nausea Only  . Sulfonamide Derivatives Swelling    To mouth    Family History  Problem Relation Age of Onset  . Breast cancer Mother   . Heart disease Mother   . Throat cancer Father   . Hypertension Father   . Arthritis Father   . Diabetes Father   . Arthritis Sister   . Obesity Sister     History   Social History  . Marital Status: Legally Separated    Spouse Name: N/A    Number of Children: N/A  . Years of Education: N/A   Occupational History  . Not on file.   Social History Main Topics  . Smoking status: Never Smoker   . Smokeless tobacco: Not on file  .  Alcohol Use: No  . Drug Use: No  . Sexually Active:    Other Topics Concern  . Not on file   Social History Narrative  . No narrative on file    ROS ALL NEGATIVE EXCEPT THOSE NOTED IN HPI  PE  General Appearance: well developed, well nourished in no acute distress, sitting in a wheelchair, morbidly obese  HEENT: symmetrical face, PERRLA, good dentition  Neck: no JVD, thyromegaly, or adenopathy, trachea midline Chest: symmetric without deformity Cardiac: PMI difficult to appreciate., RRR,  Soft S1, S2, no gallop or murmur Lung: clear to ausculation and percussion Vascular: all pulses full without bruits  Abdominal: Distended, nontender, good bowel sounds, no HSM, no bruits Extremities: no cyanosis, clubbing or edema, no sign of DVT, no varicosities  Skin: normal color, no rashes Neuro: alert and oriented x 3, non-focal Pysch: normal affect  EKG  not repeated BMET    Component Value Date/Time   NA 137 02/01/2012 0545   K 4.5 02/01/2012 0545   CL 99 02/01/2012 0545   CO2 26 02/01/2012 0545   GLUCOSE 167* 02/01/2012 0545   BUN 16 02/01/2012 0545   CREATININE 1.41* 02/01/2012 0545   CREATININE 1.33* 10/21/2010 1519   CALCIUM 9.1 02/01/2012 0545   GFRNONAA 35* 02/01/2012 0545   GFRAA 41* 02/01/2012 0545    Lipid Panel     Component Value Date/Time   CHOL  Value: 120        ATP III CLASSIFICATION:  <200     mg/dL   Desirable  200-239  mg/dL   Borderline High  >=240    mg/dL   High        06/22/2010 0355   TRIG 142 06/22/2010 0355   HDL 40 06/22/2010 0355   CHOLHDL 3.0 06/22/2010 0355   VLDL 28 06/22/2010 0355   LDLCALC  Value: 52        Total Cholesterol/HDL:CHD Risk Coronary Heart Disease Risk Table                     Men   Women  1/2 Average Risk   3.4   3.3  Average Risk       5.0   4.4  2 X Average Risk   9.6   7.1  3 X Average Risk  23.4   11.0  Use the calculated Patient Ratio above and the CHD Risk Table to determine the patient's CHD Risk.        ATP III CLASSIFICATION  (LDL):  <100     mg/dL   Optimal  100-129  mg/dL   Near or Above                    Optimal  130-159  mg/dL   Borderline  160-189  mg/dL   High  >190     mg/dL   Very High 06/22/2010 0355    CBC    Component Value Date/Time   WBC 10.3 01/30/2012 0012   RBC 3.21* 01/30/2012 0012   HGB 8.8* 01/30/2012 0012   HCT 27.7* 01/30/2012 0012   PLT 255 01/30/2012 0012   MCV 86.3 01/30/2012 0012   MCH 27.4 01/30/2012 0012   MCHC 31.8 01/30/2012 0012   RDW 15.2 01/30/2012 0012   LYMPHSABS 2.9 01/25/2012 1921   MONOABS 0.8 01/25/2012 1921   EOSABS 0.2 01/25/2012 1921   BASOSABS 0.0 01/25/2012 1921

## 2012-05-15 NOTE — Assessment & Plan Note (Signed)
Stable. Continue current medical therapy.

## 2012-05-15 NOTE — Patient Instructions (Addendum)
Your physician wants you to follow-up in: 1 year with Dr. [name].  You will receive a reminder letter in the mail two months in advance. If you don't receive a letter, please call our office to schedule the follow-up appointment.

## 2012-05-15 NOTE — Assessment & Plan Note (Signed)
Stable. Continue secondary preventative therapy.

## 2012-06-04 ENCOUNTER — Encounter: Payer: Self-pay | Admitting: *Deleted

## 2012-06-14 ENCOUNTER — Telehealth: Payer: Self-pay | Admitting: Cardiology

## 2012-06-14 NOTE — Telephone Encounter (Signed)
Daughter [name] calls for pt today b/c she has gained 6 pounds since Monday.  Weight Monday  314 Weight  Today:  320  She denies any chest pain, shortness of breath, no extremity edema. She does have swelling around the abdomen, which according to pt is where she gains fluid. Advised to take an extra 20mg  Lasix today and return call to office tomorrow with weight & symptoms.  Reassurance given.   I will forward this to Dr. [name] for review. Horton Chin RN

## 2012-06-14 NOTE — Telephone Encounter (Signed)
New problem   C/o weight gain of  6 lbs

## 2012-06-14 NOTE — Telephone Encounter (Signed)
LMOVM with daughter about doubling lasix dose until weight back at baseline & increasing potassium rich diet while on extra lasix. Also talked with family member at pt's home. Stated they too will tell daughter. Horton Chin RN

## 2012-06-14 NOTE — Telephone Encounter (Signed)
Double Lasix dose daily till weight back to baseline. Increase potassium in diet.

## 2012-06-21 ENCOUNTER — Telehealth: Payer: Self-pay | Admitting: Physician Assistant

## 2012-06-21 NOTE — Telephone Encounter (Addendum)
[name] daughter called in. Office closed due to snow. Baseline weight 314, up to 321 today. Having slightly increased SOB with ambulation. No CP or SOB at rest. Denies other significant symptoms. They called several days ago with similar complaint and were instructed to double her lasix dose (takes 20mg  BID). However, she only did this for one dose 2 days ago. They are reluctant to head to the ER. Instructed her to double her dose as previously instructed until weight is back to baseline and watch weight carefully. If her symptoms persist or worsen (even through tonight), advised to go to ER. She verbalized understanding and gratitude.  Noelia Lenart PA-C

## 2012-07-09 ENCOUNTER — Telehealth: Payer: Self-pay | Admitting: Cardiology

## 2012-07-09 NOTE — Telephone Encounter (Signed)
Order faxed to the number provided, will forward to [facility] for their knowledge.

## 2012-07-09 NOTE — Telephone Encounter (Signed)
New problem   Pt is in [place] [name] with her sister and need to go and get her coumadin check but before she can check it they need an order from Dr [name] to have this done. [place]:  Fax # [phone].Marland KitchenMarland Kitchen

## 2012-07-11 IMAGING — CT CT HEAD W/O CM
1 series · 16 of 30 positions shown, 20 images · non-contrast
Comparison: None.

CLINICAL DATA: 75-year-old female with dizziness, shortness of
breath, chest pain.

CT HEAD WITHOUT CONTRAST
TECHNIQUE: Contiguous axial images were obtained from the base of
the skull through the vertex without contrast.

[Series 2: head 5.0 spo · axial · 0.47mm/px · z∈[-147,+5]mm · 16 of 35 slices shown, 20 images]
[im 2/35  brain]
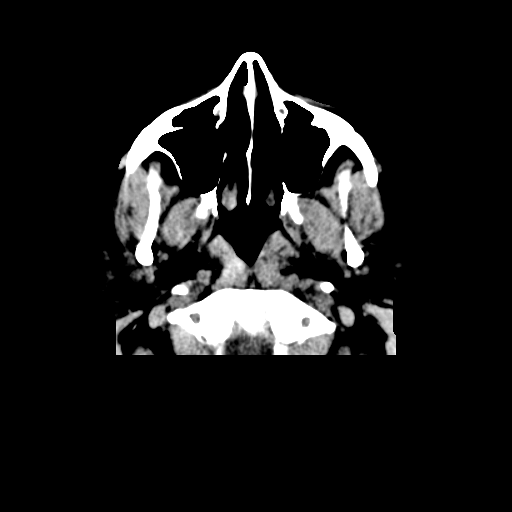
[im 2/35  bone]
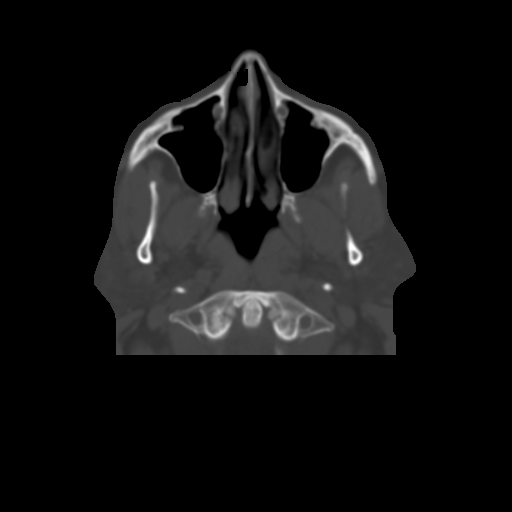
[im 4/35  brain]
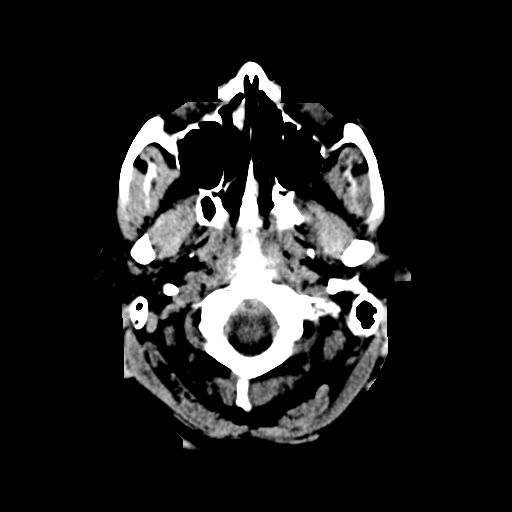
[im 6/35  brain]
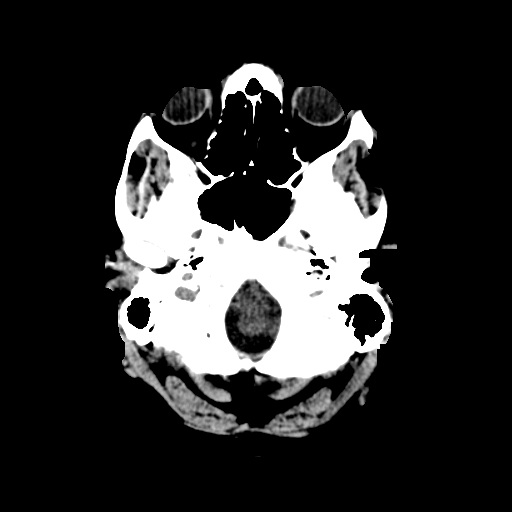
[im 9/35  brain]
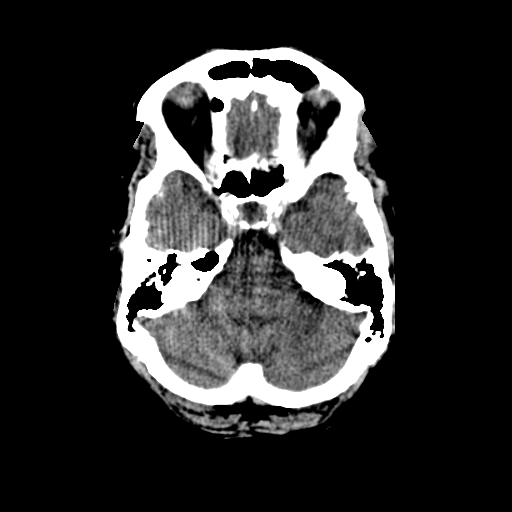
[im 10/35  brain]
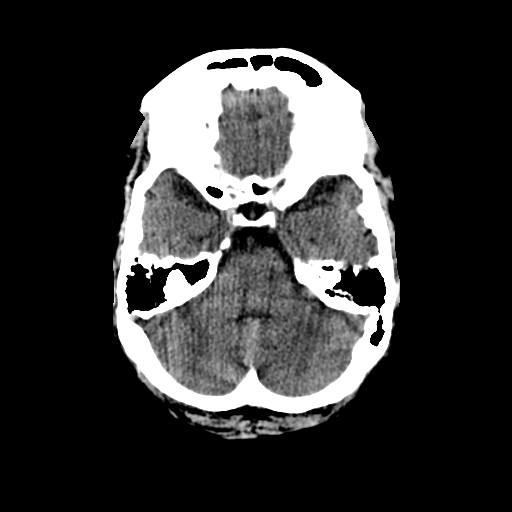
[im 10/35  bone]
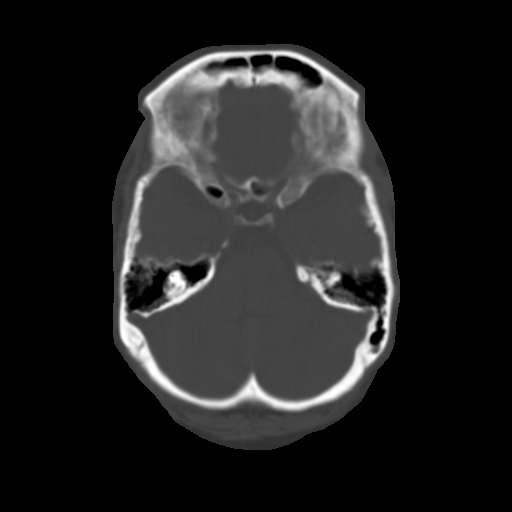
[im 12/35  brain]
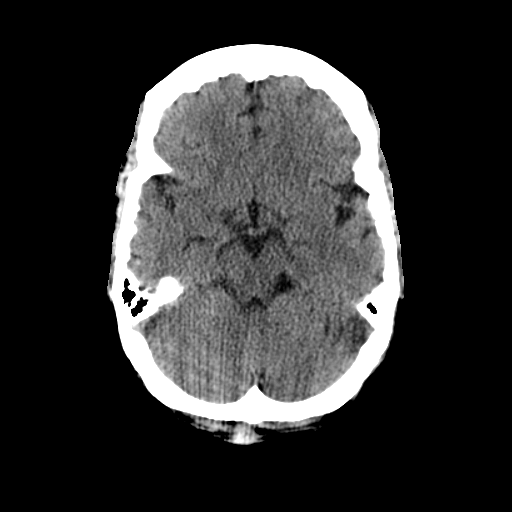
[im 15/35  brain]
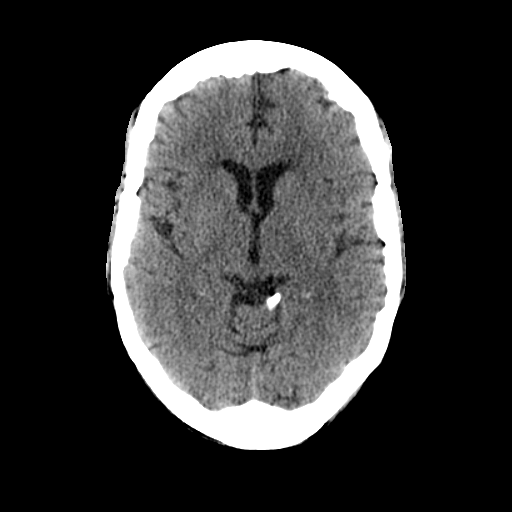
[im 17/35  brain]
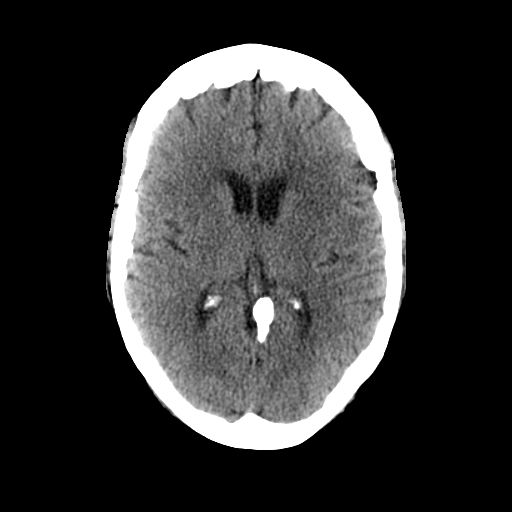
[im 18/35  brain]
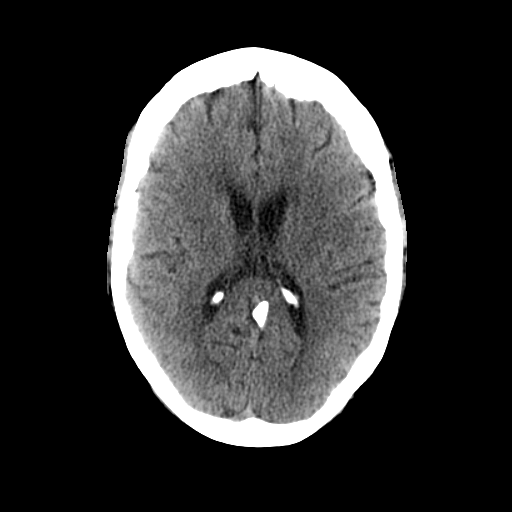
[im 18/35  bone]
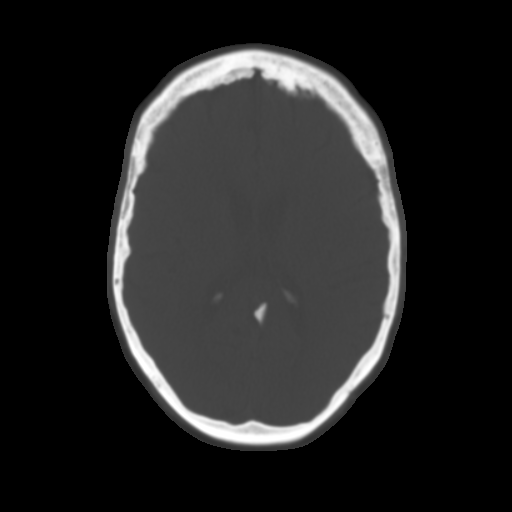
[im 20/35  brain]
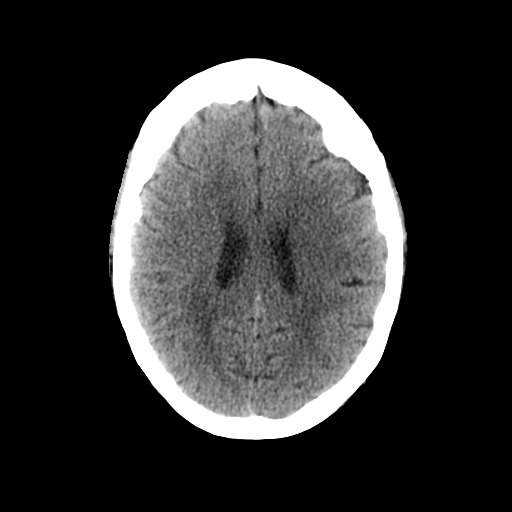
[im 23/35  brain]
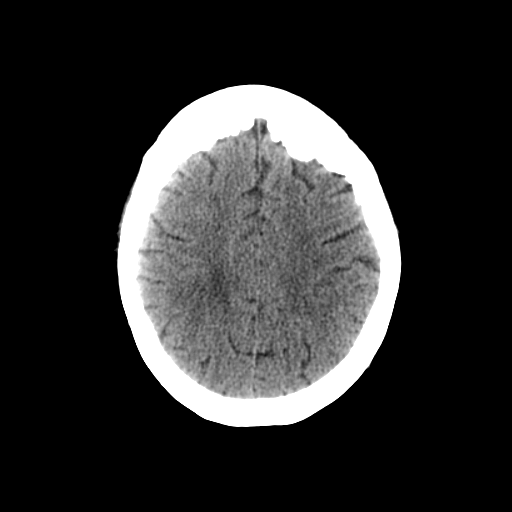
[im 25/35  brain]
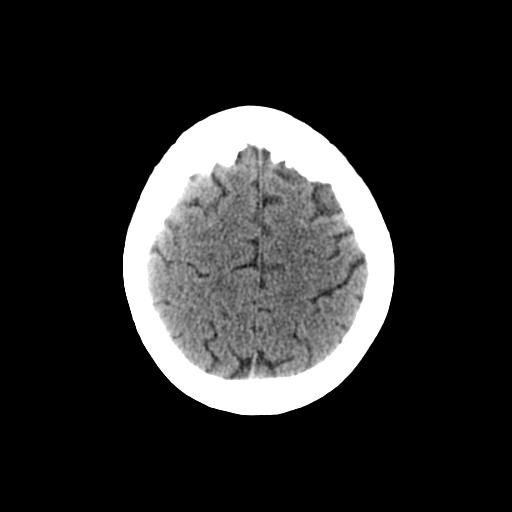
[im 26/35  brain]
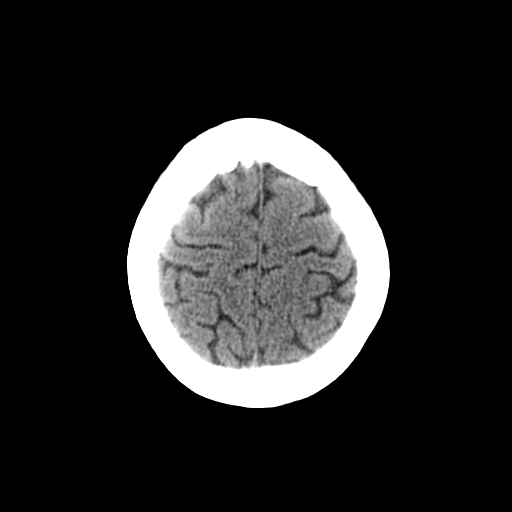
[im 26/35  bone]
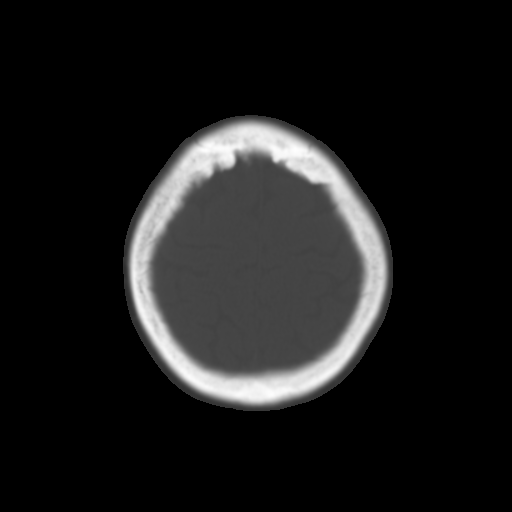
[im 29/35  brain]
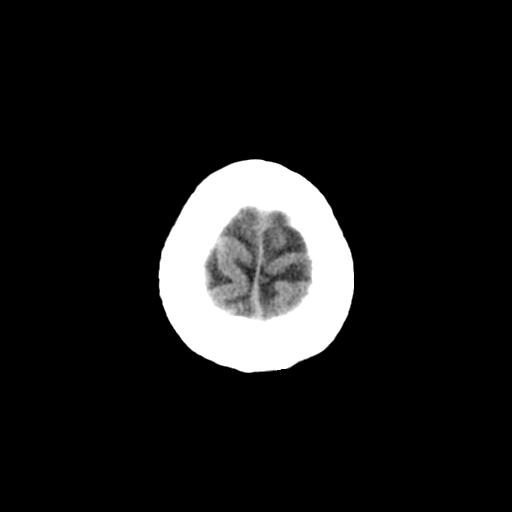
[im 31/35  brain]
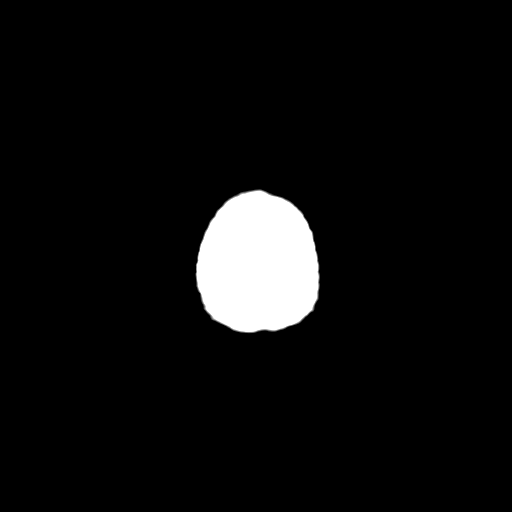
[im 33/35  brain]
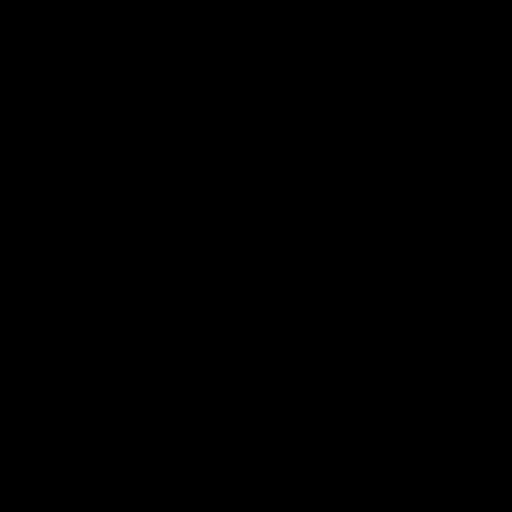

[16 of 30 positions shown; findings below may reference images not displayed]

FINDINGS: Visualized orbits and scalp soft tissues are within
normal limits.  Calcified atherosclerosis at the skull base.
Visualized paranasal sinuses and mastoids are clear.  Hyperostosis
frontalis. No acute osseous abnormality identified.

Bulky dural calcification at the left tentorial incisura. No
midline shift, mass effect, or evidence of mass lesion.  No
ventriculomegaly.  Patchy confluent cerebral white matter
hypodensity. No evidence of cortically based acute infarction
identified.  No suspicious intracranial vascular hyperdensity.
IMPRESSION: 1. No acute intracranial abnormality.
2.  Mild to moderate for age nonspecific white matter changes.
3.  Bulky calcification along the left tentorial incisura, favor
incidental dural calcification.

## 2012-07-11 IMAGING — CR DG CHEST 2V
2 series · 2 of 2 positions shown · non-contrast
Comparison: 03/10/2009 and earlier.

CLINICAL DATA: 75-year-old female with chest pain, shortness of
breath, weakness.

CHEST - 2 VIEW

[w chest pa]
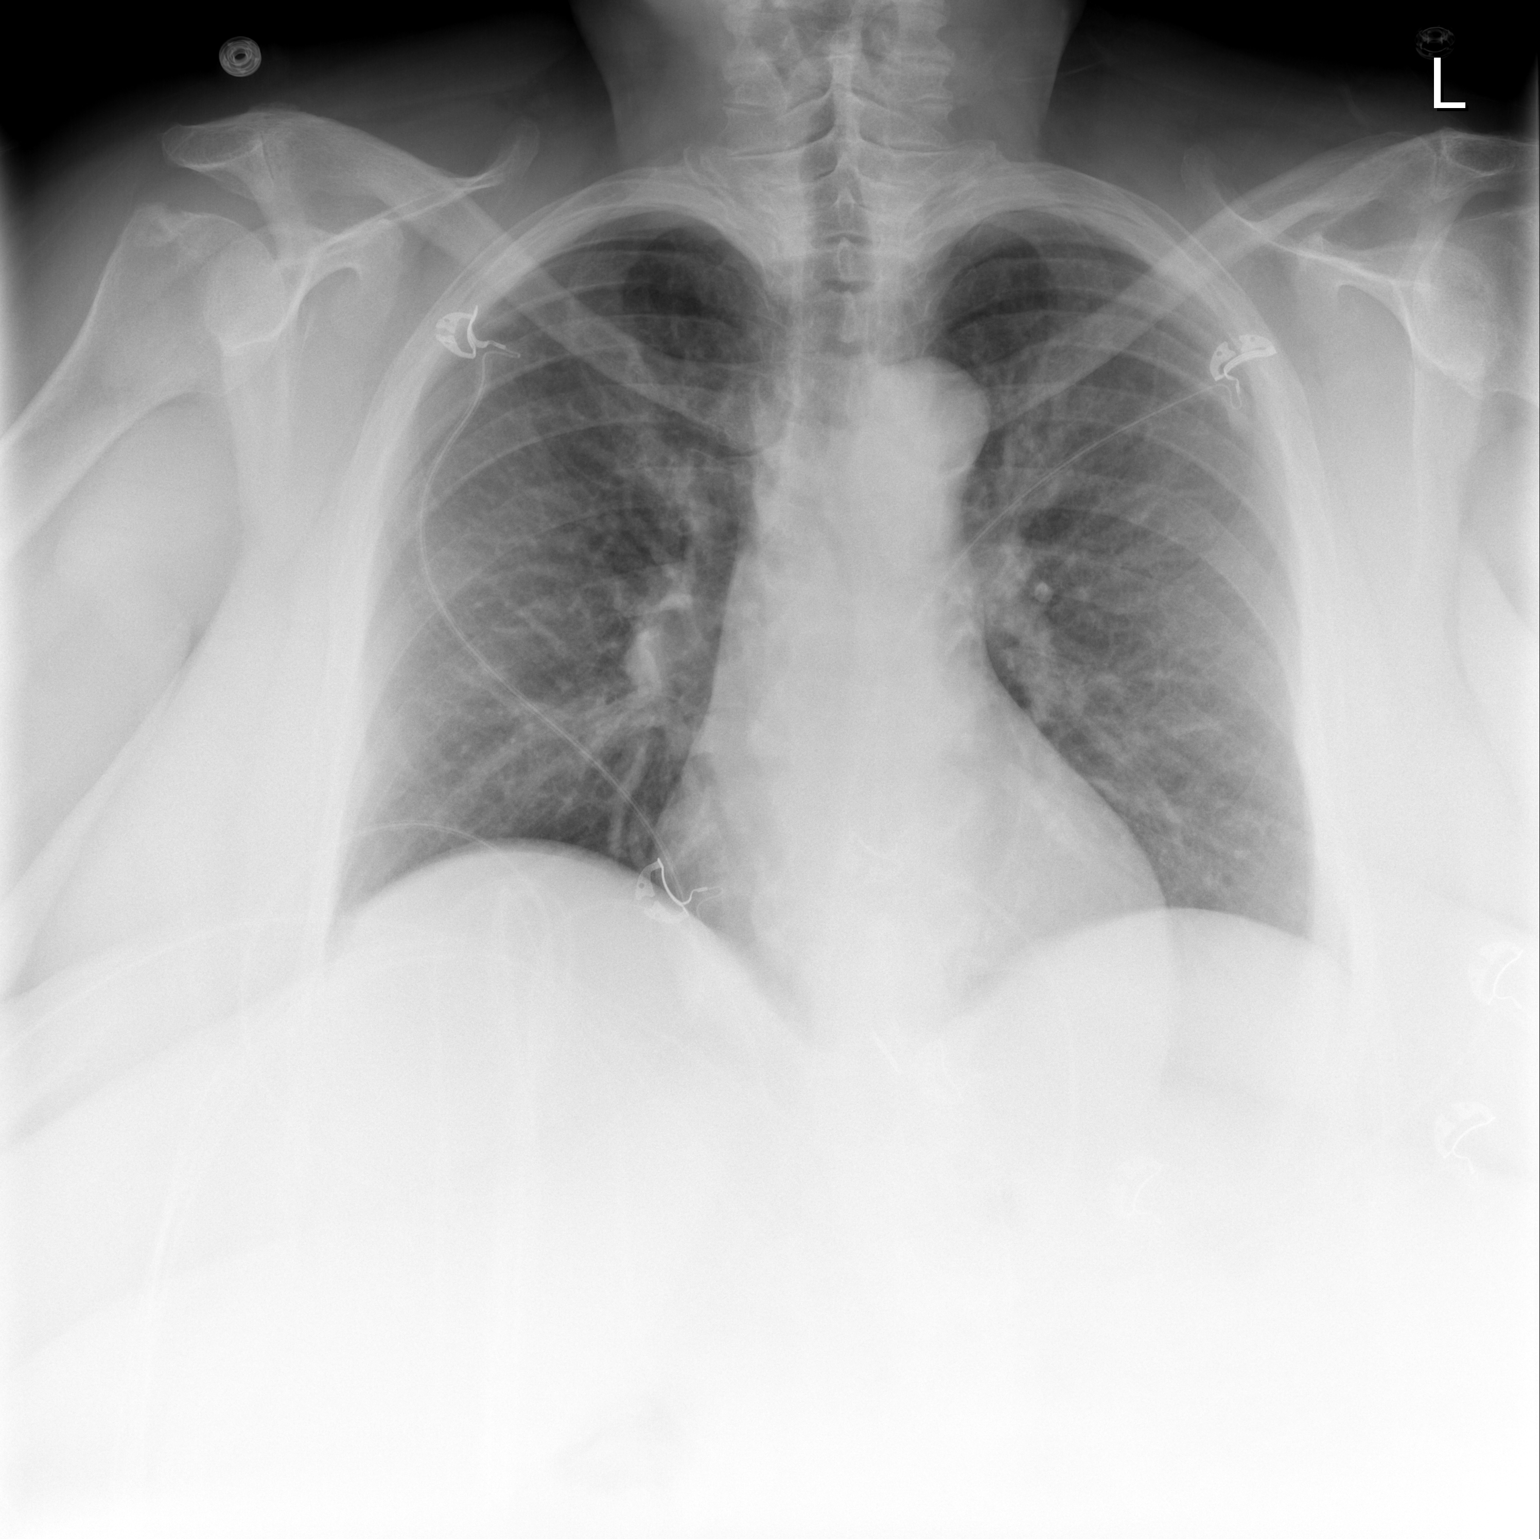

[w chest lat]
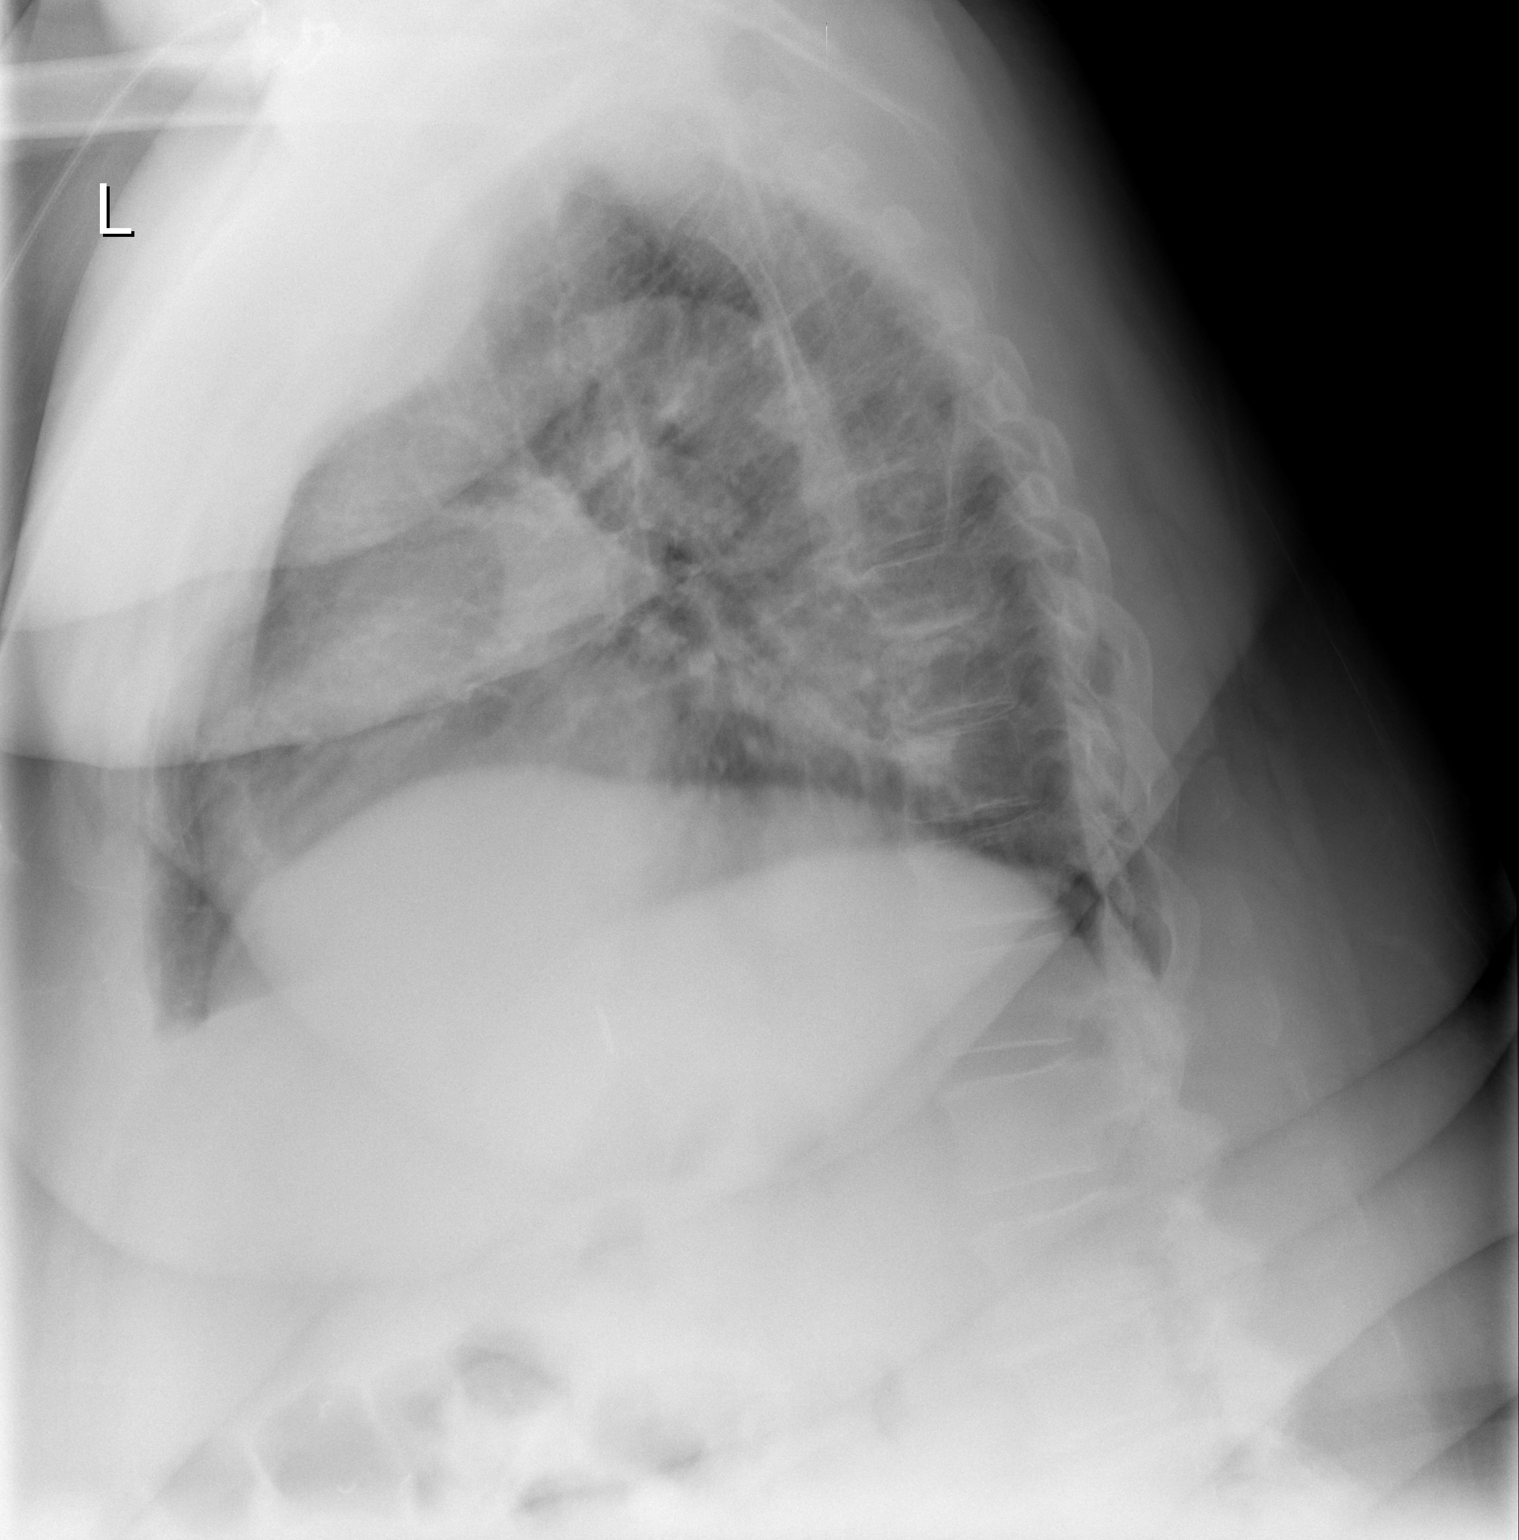

[2 of 2 positions shown; findings below may reference images not displayed]

FINDINGS: Somewhat low lung volumes.  Cardiac size and mediastinal
contours are within normal limits.  Motion artifact on the lateral
view.  No pneumothorax, pleural effusion or consolidation.  Mild
diffuse increased interstitial markings appear stable.  No
pulmonary edema. Visualized tracheal air column is within normal
limits.  No acute osseous abnormality identified.
IMPRESSION: Low lung volumes, otherwise no acute cardiopulmonary abnormality.

## 2012-07-11 NOTE — Telephone Encounter (Signed)
LMTCB at phone number provided.

## 2012-07-11 NOTE — Telephone Encounter (Signed)
Follow up    Pt's sister need to talk to someone ASAP about her sister getting her coumadin check and seeing a doctor. She stated she had called previous and no one has called her concerning her sister, she stated this was very urgent and important

## 2012-07-11 NOTE — Telephone Encounter (Signed)
lmtcb at number provided/ number provided identifies themselves as the [facility], asked to call back- number provided.

## 2012-07-16 ENCOUNTER — Telehealth: Payer: Self-pay | Admitting: *Deleted

## 2012-07-16 NOTE — Telephone Encounter (Signed)
Follow up call    Sister calling back regarding previous message

## 2012-07-16 NOTE — Telephone Encounter (Signed)
Will forward to Horton Chin to follow up on.

## 2012-07-16 NOTE — Telephone Encounter (Signed)
Patient's sister calling in regards to phone note dated 07-09-12. I let her know our system indicates we received her message at 3:48 pm and the information she request was then faxed to [facility] at 3:57pm that very same day. She states no one from the center contacted her about this nor does she know if they ever received the information. Her sister had to go to the hospital and there they said they needed permission as well to do her coumadin. She's not sure why since her sister was a patient but will call the center and see if they actually received the information. She will call me back and let me know what they say. I assured her if they did not receive this information we will resend this information she and the center has requested.  Marlis Edelson, CMA

## 2012-07-16 NOTE — Telephone Encounter (Signed)
LC - I have called the 252# left as contact # for sister and Ms Menden cell # - [id number] for them to return call twice last week and 1 x this week

## 2012-07-16 NOTE — Telephone Encounter (Signed)
Pt sister calling back b/c the fax was not received last week for the PT/INR to be drawn. Reconfirmed fax number. Also talked with Tiffany in coumadin clinic.  She will have the order refaxed today.  Sister would like to have level drawn today States pt will be coming back home to [place] but not just yet. She is staying with her in [place].   Horton Chin RN

## 2012-07-24 ENCOUNTER — Other Ambulatory Visit: Payer: Self-pay | Admitting: *Deleted

## 2012-07-24 MED ORDER — DULOXETINE HCL 60 MG PO CPEP: 60.0000 mg | ORAL_CAPSULE | Freq: Every day | ORAL | Status: DC

## 2012-07-30 ENCOUNTER — Telehealth: Payer: Self-pay | Admitting: Cardiology

## 2012-07-30 NOTE — Telephone Encounter (Signed)
New problem    Per pt had chest pains 2 wks ago while out of town and wants to come in to see dr wall tomorrow-could not find appt for dr wall or pa/np for tomorrow please advise

## 2012-07-30 NOTE — Telephone Encounter (Signed)
Called patient back. She states that she had very bad chest pains 2 weeks ago and was admitted to a hospital in [place]. Was told that she had heart damage and to follow up with cardiology. Advised to see Truitt Merle NP tomorrow at 42am. She will have a Coumadin Clinic visit after appointment with LG. I advised her to bring the records from her hospital stay with her to the appointment tomorrow.

## 2012-07-31 ENCOUNTER — Encounter: Payer: Self-pay | Admitting: Nurse Practitioner

## 2012-07-31 ENCOUNTER — Ambulatory Visit (INDEPENDENT_AMBULATORY_CARE_PROVIDER_SITE_OTHER): Payer: Medicare Other | Admitting: *Deleted

## 2012-07-31 ENCOUNTER — Encounter: Payer: Self-pay | Admitting: Internal Medicine

## 2012-07-31 ENCOUNTER — Ambulatory Visit (INDEPENDENT_AMBULATORY_CARE_PROVIDER_SITE_OTHER): Payer: Medicare Other | Admitting: Internal Medicine

## 2012-07-31 ENCOUNTER — Ambulatory Visit (INDEPENDENT_AMBULATORY_CARE_PROVIDER_SITE_OTHER): Payer: Medicare Other | Admitting: Nurse Practitioner

## 2012-07-31 VITALS — BP 126/88 | HR 81 | Temp 98.0°F | Resp 15 | Wt 333.8 lb

## 2012-07-31 VITALS — BP 120/80 | HR 56 | Ht 67.0 in | Wt 319.0 lb

## 2012-07-31 DIAGNOSIS — R1310 Dysphagia, unspecified: Secondary | ICD-10-CM

## 2012-07-31 DIAGNOSIS — R079 Chest pain, unspecified: Secondary | ICD-10-CM

## 2012-07-31 DIAGNOSIS — K219 Gastro-esophageal reflux disease without esophagitis: Secondary | ICD-10-CM | POA: Insufficient documentation

## 2012-07-31 DIAGNOSIS — N183 Chronic kidney disease, stage 3 unspecified: Secondary | ICD-10-CM

## 2012-07-31 DIAGNOSIS — Z7901 Long term (current) use of anticoagulants: Secondary | ICD-10-CM

## 2012-07-31 DIAGNOSIS — E1129 Type 2 diabetes mellitus with other diabetic kidney complication: Secondary | ICD-10-CM

## 2012-07-31 DIAGNOSIS — I2510 Atherosclerotic heart disease of native coronary artery without angina pectoris: Secondary | ICD-10-CM

## 2012-07-31 DIAGNOSIS — K449 Diaphragmatic hernia without obstruction or gangrene: Secondary | ICD-10-CM | POA: Insufficient documentation

## 2012-07-31 DIAGNOSIS — E1122 Type 2 diabetes mellitus with diabetic chronic kidney disease: Secondary | ICD-10-CM

## 2012-07-31 DIAGNOSIS — I2699 Other pulmonary embolism without acute cor pulmonale: Secondary | ICD-10-CM

## 2012-07-31 HISTORY — DX: Dysphagia, unspecified: R13.10

## 2012-07-31 LAB — BASIC METABOLIC PANEL
BUN: 23 mg/dL (ref 6–23)
CO2: 29 mEq/L (ref 19–32)
Calcium: 8.9 mg/dL (ref 8.4–10.5)
Chloride: 100 mEq/L (ref 96–112)
Creatinine, Ser: 1.3 mg/dL — ABNORMAL HIGH (ref 0.4–1.2)
GFR: 49.91 mL/min — ABNORMAL LOW (ref >60.00)
Glucose, Bld: 209 mg/dL — ABNORMAL HIGH (ref 70–99)
Potassium: 4.7 mEq/L (ref 3.5–5.1)
Sodium: 138 mEq/L (ref 135–145)

## 2012-07-31 LAB — CBC WITH DIFFERENTIAL/PLATELET
Basophils Absolute: 0 10*3/uL (ref 0.0–0.1)
Basophils Relative: 0.4 % (ref 0.0–3.0)
Eosinophils Absolute: 0.2 10*3/uL (ref 0.0–0.7)
Eosinophils Relative: 2.2 % (ref 0.0–5.0)
HCT: 34.2 % — ABNORMAL LOW (ref 36.0–46.0)
Hemoglobin: 11 g/dL — ABNORMAL LOW (ref 12.0–15.0)
Lymphocytes Relative: 33.3 % (ref 12.0–46.0)
Lymphs Abs: 3.3 10*3/uL (ref 0.7–4.0)
MCHC: 32.2 g/dL (ref 30.0–36.0)
MCV: 84.2 fl (ref 78.0–100.0)
Monocytes Absolute: 0.7 10*3/uL (ref 0.1–1.0)
Monocytes Relative: 7.1 % (ref 3.0–12.0)
Neutro Abs: 5.7 10*3/uL (ref 1.4–7.7)
Neutrophils Relative %: 57 % (ref 43.0–77.0)
Platelets: 319 10*3/uL (ref 150.0–400.0)
RBC: 4.07 Mil/uL (ref 3.87–5.11)
RDW: 15.3 % — ABNORMAL HIGH (ref 11.5–14.6)
WBC: 10 10*3/uL (ref 4.5–10.5)

## 2012-07-31 LAB — POCT INR: INR: 2.2

## 2012-07-31 MED ORDER — DEXLANSOPRAZOLE 30 MG PO CPDR: 30.0000 mg | DELAYED_RELEASE_CAPSULE | Freq: Every day | ORAL | Status: DC

## 2012-07-31 NOTE — Progress Notes (Signed)
[name] Date of Birth: 05-27-1934 Medical Record [id number]  History of Present Illness: [name] is seen back today for a work in visit. She is seen for Dr. [name]. She has multiple issues which include known CAD with past inferior MI in 2004. She has had bare-metal stent to the RCA in 2004 and drug-eluting stent placement to the RCA in 06/2010 in the setting of unstable angina with overall preserved LV function.  Last cath in 2012 showing otherwise nonobstructive disease. Her other problems include morbid obesity, severe OA - basically confined to a wheelchair, COPD, DM, HTN, HLD, incontinence, OSA, polymyalgia rheumatica, anxiety, depression and gallbladder disease. She is on chronic anticoagulation for PE since September of 2013. Echo last September 2013 was a TDS but with a normal EF noted.   Last seen here in January. Called yesterday to report that she had been hospitalized in [place] with chest pain 2 weeks ago. This appointment was thus made with me.   She comes in today. She is here with a family member. She is in a wheelchair. She says she was staying at one of her daughter's house in [place] (apparently goes back and forth between family members). Had "bad chest pain" while there - took NTG x 1 - called EMS and was "fine" by the time she got to the hospital. She is not able to tell me really anything regarding that visit. I have no records. She does not believe she had a heart cath - may have had a stress test. She is not able to tell me about her lab results. All she can tell me is that the doctor there said her heart "was half damaged". She returned to [place] earlier this week and is going back to [place]. She has had no more spells of chest pain and is back to her baseline. Very limited mobility. Basically wheelchair bound.   Current Outpatient Prescriptions on File Prior to Visit  Medication Sig Dispense Refill  . clonazePAM (KLONOPIN) 1 MG tablet Take 2 mg by  mouth 2 (two) times daily as needed.       . colchicine 0.6 MG tablet Take 1 tablet (0.6 mg total) by mouth daily.  30 tablet  0  . DULoxetine (CYMBALTA) 60 MG capsule Take 1 capsule (60 mg total) by mouth daily.  30 capsule  5  . exenatide (BYETTA) 10 MCG/0.04ML SOLN Inject 0.04 mLs (10 mcg total) into the skin 2 (two) times daily with a meal.  1.2 mL  0  . furosemide (LASIX) 20 MG tablet Take 1 tablet (20 mg total) by mouth 2 (two) times daily.  30 tablet  0  . insulin glargine (LANTUS) 100 UNIT/ML injection Inject 60 Units into the skin daily.       . metoprolol succinate (TOPROL-XL) 25 MG 24 hr tablet Take 1 tablet (25 mg total) by mouth daily.  30 tablet  0  . nitroGLYCERIN (NITROSTAT) 0.4 MG SL tablet Place 0.4 mg under the tongue every 5 (five) minutes as needed. For chest pain      . oxyCODONE-acetaminophen (PERCOCET/ROXICET) 5-325 MG per tablet Take 1 tablet by mouth every 4 (four) hours as needed for pain.  30 tablet  0  . pantoprazole (PROTONIX) 40 MG tablet Take 1 tablet (40 mg total) by mouth 2 (two) times daily.  60 tablet  0  . pravastatin (PRAVACHOL) 20 MG tablet Take 1 tablet (20 mg total) by mouth daily.  [place]  tablet  0  . warfarin (COUMADIN) 5 MG tablet Take 1.5 tablets (7.5 mg total) by mouth daily at 6 PM.  45 tablet  0   No current facility-administered medications on file prior to visit.    Allergies  Allergen Reactions  . Codeine Nausea Only  . Sulfonamide Derivatives Swelling    To mouth    Past Medical History  Diagnosis Date  . COPD (chronic obstructive pulmonary disease)     patient denies this  . Diabetes mellitus   . Diverticular disease   . Hypertension   . Hyperlipidemia   . Coronary artery disease     a. s/p IMI 2004 tx with BMS to RCA;  b. s/p Promus DES to RCA 2/12 (cath: LM 20-30%, pLAD 20-30%, mLAD 50%, RI 30%, pRCA 60%, mRCA 90% - tx with PCI);  c. myoview 1/07: EF 49%, inf scar, no isch  . Urinary incontinence   . Insomnia   . Osteoarthritis     . Morbid obesity   . Obstructive sleep apnea   . Polymyalgia rheumatica   . Anxiety   . Arthritis   . Depression   . Cholelithiasis   . CHF (congestive heart failure)   . GERD (gastroesophageal reflux disease)   . Pulmonary emboli 9/13    felt to need lifelong anticoagulation    Past Surgical History  Procedure Laterality Date  . Coronary artery bypass graft    . Appendectomy    . Cardiac catheterization    . Gastric bypass  1977    reversed in 1979    History  Smoking status  . Never Smoker   Smokeless tobacco  . Not on file    History  Alcohol Use No    Family History  Problem Relation Age of Onset  . Breast cancer Mother   . Heart disease Mother   . Throat cancer Father   . Hypertension Father   . Arthritis Father   . Diabetes Father   . Arthritis Sister   . Obesity Sister   . Diabetes Sister     Review of Systems: The review of systems is per the HPI.  All other systems were reviewed and are negative.  Physical Exam: BP 120/80  Pulse 56  Ht 5\' 7"  (1.702 m)  Wt 319 lb (144.697 kg)  BMI 49.95 kg/m2 Patient is alert and in no acute distress. She is morbidly obese. She is not able to stand to weigh. She reports her weight to be 319. She is in a wheelchair. Skin is warm and dry. Color is normal.  HEENT is unremarkable. Normocephalic/atraumatic. PERRL. Sclera are nonicteric. Neck is supple. No masses. No JVD. Lungs are fairly clear. Cardiac exam shows a regular rate and rhythm. Heart tones are quite distant.  Abdomen is obese but soft. Extremities are without edema. Gait is not tested. ROM appears intact. No gross neurologic deficits noted.  LABORATORY DATA:  EKG today shows inferior MI, incomplete RBBB. No acute changes.   Lab Results  Component Value Date   WBC 10.3 01/30/2012   HGB 8.8* 01/30/2012   HCT 27.7* 01/30/2012   PLT 255 01/30/2012   GLUCOSE 167* 02/01/2012   CHOL  Value: 120        ATP III CLASSIFICATION:  <200     mg/dL   Desirable  200-239   mg/dL   Borderline High  >=240    mg/dL   High        06/22/2010   TRIG  142 06/22/2010   HDL 40 06/22/2010   LDLCALC  Value: 52        Total Cholesterol/HDL:CHD Risk Coronary Heart Disease Risk Table                     Men   Women  1/2 Average Risk   3.4   3.3  Average Risk       5.0   4.4  2 X Average Risk   9.6   7.1  3 X Average Risk  23.4   11.0        Use the calculated Patient Ratio above and the CHD Risk Table to determine the patient's CHD Risk.        ATP III CLASSIFICATION (LDL):  <100     mg/dL   Optimal  100-129  mg/dL   Near or Above                    Optimal  130-159  mg/dL   Borderline  160-189  mg/dL   High  >190     mg/dL   Very High 06/22/2010   ALT 21 01/25/2012   AST 24 01/25/2012   NA 137 02/01/2012   K 4.5 02/01/2012   CL 99 02/01/2012   CREATININE 1.41* 02/01/2012   BUN 16 02/01/2012   CO2 26 02/01/2012   TSH 0.785 01/26/2012   INR 3.9 05/15/2012   HGBA1C  Value: 9.4 (NOTE)                                                                       According to the ADA Clinical Practice Recommendations for 2011, when HbA1c is used as a screening test:   >=6.5%   Diagnostic of Diabetes Mellitus           (if abnormal result  is confirmed)  5.7-6.4%   Increased risk of developing Diabetes Mellitus  References:Diagnosis and Classification of Diabetes Mellitus,Diabetes [id number] 1):S62-S69 and Standards of Medical Care in         Diabetes - 2011,Diabetes Care,2011,34  (Suppl 1):S11-S61.* 07/03/2010    Lab Results  Component Value Date   INR 3.9 05/15/2012   INR 2.5 04/22/2012   INR 1.2 04/15/2012    CORONARY ANGIOGRAPHY from Feb. 2012  1. The left mainstem is calcified. There is 20% to 30% distal left  main stenosis.  2. LAD: The LAD has mild diffuse stenosis. There is moderate  calcification present. The vessel gives off two diagonal branches.  The second is the larger branch. The proximal LAD has 20% to 30%  stenosis. The mid LAD has 50% stenosis.  3. Left circumflex: The left  circumflex is also calcified. The  vessel is patent throughout. There is nonobstructive stenosis in  the ramus intermedius which is the main branch vessel of the  circumflex. This has approximately 30% to 40% stenosis.  4. Right coronary artery. The right coronary artery is a large,  dominant vessel. There is a 60% proximal stenosis. The mid vessel  has an eccentric 90% lesion which is markedly progressed from the  previous study. The PDA and posterolateral branches are both  widely patent. There is moderate calcification throughout the  right coronary  artery.  5. Left ventriculography shows normal LV function. The ejection  fraction is estimated at 55%.   ASSESSMENT:  1. Severe right coronary artery stenosis with successful percutaneous  intervention using a long drug-eluting stent in the mid vessel and  a 16-mm drug-eluting stents in the proximal vessel.  2. Nonobstructive left main, left anterior descending, and left  circumflex stenoses.  3. Normal left ventricular function.   RECOMMENDATIONS: The patient should be continued on dual antiplatelettherapy with aspirin and prasugrel for a minimum of 12 months.  Juanda Bond. Burt Knack, MD  MDC/MEDQ D: 06/22/2010 T: 06/23/2010 Job: [id number]    Echo Study Conclusions from September 2013  - Left ventricle: Difficult study. LV wall motion is good. I do not see an obvious wall motion abnormality. The cavity size was normal. Wall thickness was increased in a pattern of mild LVH. The estimated ejection fraction was 60%. Regional wall motion abnormalities cannot be excluded. - Right ventricle: The cavity size was moderately dilated. Systolic function was moderately reduced. RV systolic pressure is probably in the 38mmHg range. - Right atrium: The atrium was mildly dilated.   Assessment / Plan: 1. Chest pain - with past hospitalization 3 weeks ago - known CAD - seems to be back to her baseline. Will need to review her records with further  disposition to follow. I would be inclined to manage her medically. She has had no more chest pain.   2. Morbid obesity - this is the crux of her issues.  3. HTN - BP is ok today  4. PE - on coumadin - checking INR today  5. Past anemia - check baseline labs today as well.  Further disposition to follow.   Patient is agreeable to this plan and will call if any problems develop in the interim.   Burtis Junes, RN, ANP-C [facility] [address] [place], [place]  [id number]

## 2012-07-31 NOTE — Assessment & Plan Note (Addendum)
cbg between 213-327, recently increased lantus to 65 u. Poorly controlled dm, on lantus, humalog and byetta

## 2012-07-31 NOTE — Assessment & Plan Note (Signed)
New onset, no weight loss, appetite is good. Has hx of hiatal hernia which could be contributing to this by pressing on her esophagus. Will get modified barium swallow to assess further. If the test is normal, will refer to gi for EGD vs motility study. Will change protonix to dexilant for now

## 2012-07-31 NOTE — Progress Notes (Signed)
  Subjective:    Patient ID: [name], female    DOB: 20-Oct-1934, 78 y.o.   MRN: [id number]  Chief Complaint  Patient presents with  . Dysphagia   HPI  She has been having difficulty swallowing food both liquid and solid for 3 weeks now. She has been having it with most of her meals and now having on and off vomiting for 2 days and ocassional choking with swallowing food. She feels food getting stuck in her throat. She has been feeling nauseous. Denies any abdominal pain at present but ocassionally has epigastric pain. She is wheelchair bound. She has heartburn She has hiatal hernia Appetite has improved and she has been eating a lot recently Denies any chest pain since recent hospitalization in [place] system on 07/01/12. Unable to obtain discharge summary of that visit at present but admission was for chest pain which resolved with asa and NTG. She was seen at [facility] this am. Reviewed note  Review of Systems  Constitutional: Positive for fatigue. Negative for fever and chills.  HENT: Negative for mouth sores.   Respiratory: Positive for choking.   Cardiovascular: Negative for chest pain and palpitations.  Gastrointestinal: Positive for nausea, vomiting, abdominal pain and constipation. Negative for blood in stool.  Endocrine: Positive for polyphagia.  Genitourinary: Negative.   Neurological: Negative for dizziness.  regular bowel movement for past few days     Objective:   Physical Exam  Constitutional: She is oriented to person, place, and time.  Morbidly obese  HENT:  Head: Normocephalic and atraumatic.  Eyes: Pupils are equal, round, and reactive to light.  Neck: Normal range of motion. Neck supple.  Cardiovascular: Normal rate and regular rhythm.   Pulmonary/Chest: Effort normal and breath sounds normal.  Abdominal: Soft. Bowel sounds are normal.  Musculoskeletal: Normal range of motion. She exhibits no edema.  Restricted movement due to body  habitus  Neurological: She is alert and oriented to person, place, and time.  Skin: Skin is warm and dry.  Psychiatric: She has a normal mood and affect.   BP 126/88  Pulse 81  Temp(Src) 98 F (36.7 C) (Oral)  Resp 15  Wt 333 lb 12.8 oz (151.411 kg)  BMI 52.27 kg/m2       Assessment & Plan:   DM type 2 causing CKD stage 3 cbg between 213-327, recently increased lantus to 65 u. Poorly controlled dm, on lantus, humalog and byetta  Dysphagia, unspecified New onset, no weight loss, appetite is good. Has hx of hiatal hernia which could be contributing to this by pressing on her esophagus. Will get modified barium swallow to assess further. If the test is normal, will refer to gi for EGD vs motility study. Will change protonix to dexilant for now  GERD (gastroesophageal reflux disease) Will d/c protonix and have her on dexilant for now and reassess

## 2012-07-31 NOTE — Patient Instructions (Addendum)
Stay on your current medicines  Sign a release for records from your recent hospitalization  We will be in touch with you after review of your records  Call the [place] office at [phone] if you have any questions, problems or concerns.

## 2012-07-31 NOTE — Assessment & Plan Note (Signed)
Will d/c protonix and have her on dexilant for now and reassess

## 2012-08-01 ENCOUNTER — Ambulatory Visit (INDEPENDENT_AMBULATORY_CARE_PROVIDER_SITE_OTHER): Payer: Medicare Other | Admitting: Nurse Practitioner

## 2012-08-01 ENCOUNTER — Encounter: Payer: Self-pay | Admitting: *Deleted

## 2012-08-01 ENCOUNTER — Encounter: Payer: Self-pay | Admitting: Nurse Practitioner

## 2012-08-01 VITALS — BP 124/68 | HR 70 | Temp 97.4°F | Resp 16 | Ht 67.0 in | Wt 333.0 lb

## 2012-08-01 DIAGNOSIS — I5032 Chronic diastolic (congestive) heart failure: Secondary | ICD-10-CM

## 2012-08-01 DIAGNOSIS — R5381 Other malaise: Secondary | ICD-10-CM

## 2012-08-01 DIAGNOSIS — R531 Weakness: Secondary | ICD-10-CM | POA: Insufficient documentation

## 2012-08-01 DIAGNOSIS — M1990 Unspecified osteoarthritis, unspecified site: Secondary | ICD-10-CM | POA: Insufficient documentation

## 2012-08-01 DIAGNOSIS — I509 Heart failure, unspecified: Secondary | ICD-10-CM

## 2012-08-01 HISTORY — DX: Other malaise: R53.81

## 2012-08-01 NOTE — Assessment & Plan Note (Signed)
generalized weakness associated with recurrent pains -- has worked with therapies after last hospitalization but was discharged due to lack of progression secondary to increased pain.  pts multiple co-morbities which he will likely not improve from have made it very difficult to be mobile and get to events for daily living now will needs power wheelchair.

## 2012-08-01 NOTE — Assessment & Plan Note (Signed)
Unchanged- stable. Cont current medications

## 2012-08-01 NOTE — Assessment & Plan Note (Signed)
Ongoing problem- pt unable to comply with lifestyle changes

## 2012-08-01 NOTE — Progress Notes (Signed)
Patient ID: [name], female   DOB: 1934/11/19, 78 y.o.   MRN: [id number]  Code Status: DNR  Allergies  Allergen Reactions  . Codeine Nausea Only  . Sulfonamide Derivatives Swelling    To mouth    Chief Complaint  Patient presents with  . wants a Rx for a power wheelchair. Patient has no paperwork.    HPI: Patient is a 78 y.o. female seen in the office today for electric wheelchair Currently in a wheelchair and has no one to push her. She is unable to move around by herself due to left should OA. Pain has lead to decreased range of motion. Is unable to walk due to OA and pain in her knees. She requires assistance to move from chair to bed. She believes a power wheelchair can improve her quality of life. She will be able to get around and not have to sit in the same place. Now she requires someone to bring her everything she needs. She feels like she is able to drive a power wheelchair.    Review of Systems:  Review of Systems  Constitutional: Negative for fever and chills. Malaise/fatigue: chronic fatigue.  Respiratory: Positive for shortness of breath. Sputum production: increased with activity    Cardiovascular: Negative for chest pain, palpitations and leg swelling.  Gastrointestinal: Positive for heartburn (recent change in medication ) and constipation.  Musculoskeletal: Positive for myalgias and joint pain (reports pain in all joints. worst pain in bilateral knees and left shoulder ). Negative for back pain.  Skin: Negative for itching and rash.  Neurological: Positive for tingling (some tingling in bilateral lower extermities ) and weakness (chronic).       Numbness in right hand fingers- chronic     Past Medical History  Diagnosis Date  . COPD (chronic obstructive pulmonary disease)     patient denies this  . Diabetes mellitus   . Diverticular disease   . Hypertension   . Hyperlipidemia   . Coronary artery disease     a. s/p IMI 2004 tx with BMS to RCA;  b. s/p  Promus DES to RCA 2/12 (cath: LM 20-30%, pLAD 20-30%, mLAD 50%, RI 30%, pRCA 60%, mRCA 90% - tx with PCI);  c. myoview 1/07: EF 49%, inf scar, no isch  . Urinary incontinence   . Insomnia   . Osteoarthritis   . Morbid obesity   . Obstructive sleep apnea   . Polymyalgia rheumatica   . Anxiety   . Arthritis   . Depression   . Cholelithiasis   . CHF (congestive heart failure)   . GERD (gastroesophageal reflux disease)   . Pulmonary emboli 9/13    felt to need lifelong anticoagulation   Past Surgical History  Procedure Laterality Date  . Coronary artery bypass graft    . Appendectomy    . Cardiac catheterization    . Gastric bypass  1977    reversed in 1979   Social History:   reports that she has never smoked. She does not have any smokeless tobacco history on file. She reports that she does not drink alcohol or use illicit drugs.  Family History  Problem Relation Age of Onset  . Breast cancer Mother   . Heart disease Mother   . Throat cancer Father   . Hypertension Father   . Arthritis Father   . Diabetes Father   . Arthritis Sister   . Obesity Sister   . Diabetes Sister     Medications:  Patient's Medications  New Prescriptions   No medications on file  Previous Medications   CLONAZEPAM (KLONOPIN) 1 MG TABLET    Take 2 mg by mouth 2 (two) times daily as needed.    COLCHICINE 0.6 MG TABLET    Take 1 tablet (0.6 mg total) by mouth daily.   DEXLANSOPRAZOLE (DEXILANT) 30 MG CAPSULE    Take 1 capsule (30 mg total) by mouth daily.   DOCUSATE SODIUM (COLACE) 100 MG CAPSULE    Take 100 mg by mouth 3 (three) times daily as needed for constipation.   DULOXETINE (CYMBALTA) 60 MG CAPSULE    Take 1 capsule (60 mg total) by mouth daily.   EXENATIDE (BYETTA) 10 MCG/0.04ML SOLN    Inject 0.04 mLs (10 mcg total) into the skin 2 (two) times daily with a meal.   FUROSEMIDE (LASIX) 20 MG TABLET    Take 1 tablet (20 mg total) by mouth 2 (two) times daily.   INSULIN GLARGINE (LANTUS) 100  UNIT/ML INJECTION    Inject 65 Units into the skin daily.    INSULIN LISPRO (HUMALOG) 100 UNIT/ML INJECTION    Inject 10 Units into the skin 2 (two) times daily with a meal.   ISOSORBIDE MONONITRATE (IMDUR) 30 MG 24 HR TABLET    Take 60 mg by mouth daily.   METOPROLOL SUCCINATE (TOPROL-XL) 25 MG 24 HR TABLET    Take 1 tablet (25 mg total) by mouth daily.   NITROGLYCERIN (NITROSTAT) 0.4 MG SL TABLET    Place 0.4 mg under the tongue every 5 (five) minutes as needed. For chest pain   OXYCODONE-ACETAMINOPHEN (PERCOCET/ROXICET) 5-325 MG PER TABLET    Take 1 tablet by mouth every 4 (four) hours as needed for pain.   PRAVASTATIN (PRAVACHOL) 20 MG TABLET    Take 1 tablet (20 mg total) by mouth daily.   WARFARIN (COUMADIN) 5 MG TABLET    Take 1.5 tablets (7.5 mg total) by mouth daily at 6 PM.  Modified Medications   No medications on file  Discontinued Medications   No medications on file   Physical Exam  Vitals reviewed. Constitutional: She appears well-developed and well-nourished. No distress.  HENT:  Head: Normocephalic and atraumatic.  Eyes: EOM are normal. Pupils are equal, round, and reactive to light.  Neck: Normal range of motion. Neck supple.  Cardiovascular: Normal rate, regular rhythm, normal heart sounds and intact distal pulses.   Musculoskeletal:       Left shoulder: She exhibits decreased range of motion and tenderness.  Generalized weakness to lower extremities. Uses wheelchair due to severe OA which makes it difficult to ambulate. Only stands to pivot for transfers and needs assistance with this.   Skin: She is not diaphoretic.    Filed Vitals:   08/01/12 1153  BP: 124/68  Pulse: 70  Temp: 97.4 F (36.3 C)  Resp: 16  Height: 5\' 7"  (1.702 m)  Weight: 333 lb (151.048 kg)      Labs reviewed: Basic Metabolic Panel:  Recent Labs  01/26/12 0306  01/31/12 0540 02/01/12 0545 05/13/12 07/31/12 0905  NA 133*  < > 137 137 137 138  K 5.2*  < > 4.5 4.5 4.4 4.7  CL 100   < > 100 99  --  100  CO2 20  < > 27 26  --  29  GLUCOSE 214*  < > 173* 167*  --  209*  BUN 20  < > 14 16 28* 23  CREATININE 1.25*  < >  1.39* 1.41* 1.4* 1.3*  CALCIUM 8.5  < > 8.7 9.1  --  8.9  TSH 0.785  --   --   --   --   --   < > = values in this interval not displayed. Liver Function Tests:  Recent Labs  01/25/12 1921 05/13/12  AST 24 12*  ALT 21 18  ALKPHOS 80 86  BILITOT 0.3  --   PROT 7.5  --   ALBUMIN 3.2*  --     Recent Labs  01/25/12 1921  LIPASE 52   CBC:  Recent Labs  01/29/12 0530 01/30/12 0012 05/13/12 07/31/12 0905  WBC 11.7* 10.3  --  10.0  NEUTROABS  --   --  4 5.7  HGB 8.4* 8.8* 9.8* 11.0*  HCT 26.7* 27.7* 31* 34.2*  MCV 86.7 86.3  --  84.2  PLT 231 255 321 319.0   Lipid Panel:  Recent Labs  05/13/12  CHOL 136  HDL 39  LDLCALC 65  TRIG 160     Assessment/Plan OBESITY, MORBID Ongoing problem- pt unable to comply with lifestyle changes  OSTEOARTHRITIS Unchanged- with increased pain in bilateral knees. Using PRN medication and now has wheelchair that she is unable to propel self around or use arms due to pain in shoulder- uses PRN oxy to help with pain.   Debility generalized weakness associated with recurrent pains -- has worked with therapies after last hospitalization but was discharged due to lack of progression secondary to increased pain.  pts multiple co-morbities which he will likely not improve from have made it very difficult to be mobile and get to events for daily living now will needs power wheelchair.    Chronic diastolic congestive heart failure Unchanged- stable. Cont current medications     Labs/tests ordered           Disregard below text. End of note. ROS    Physical Exam: Physical Exam  Vitals reviewed. Constitutional: She appears well-developed and well-nourished. No distress.  HENT:  Head: Normocephalic and atraumatic.  Eyes: EOM are normal. Pupils are equal, round, and reactive to light.   Neck: Normal range of motion. Neck supple.  Cardiovascular: Normal rate, regular rhythm, normal heart sounds and intact distal pulses.   Musculoskeletal:       Left shoulder: She exhibits decreased range of motion and tenderness.  Generalized weakness to lower extremities. Uses wheelchair due to severe OA which makes it difficult to ambulate. Only stands to pivot for transfers and needs assistance with this.   Skin: She is not diaphoretic.

## 2012-08-01 NOTE — Assessment & Plan Note (Addendum)
Unchanged- with increased pain in bilateral knees. Using PRN medication and now has wheelchair that she is unable to propel self around or use arms due to pain in shoulder- uses PRN oxy to help with pain.

## 2012-08-05 ENCOUNTER — Telehealth: Payer: Self-pay | Admitting: Nurse Practitioner

## 2012-08-05 NOTE — Telephone Encounter (Signed)
ROI Faxed to [facility] @ [phone] 08/05/12/KM

## 2012-08-06 ENCOUNTER — Telehealth: Payer: Self-pay | Admitting: Nurse Practitioner

## 2012-08-06 NOTE — Telephone Encounter (Signed)
Records rec From [facility], gave to [facility] 08/06/12/km

## 2012-08-12 ENCOUNTER — Ambulatory Visit: Payer: Self-pay | Admitting: Pharmacotherapy

## 2012-08-13 ENCOUNTER — Ambulatory Visit: Payer: Self-pay | Admitting: Nurse Practitioner

## 2012-08-14 ENCOUNTER — Other Ambulatory Visit: Payer: Self-pay | Admitting: Internal Medicine

## 2012-08-26 LAB — POCT INR: INR: 2.2

## 2012-08-28 ENCOUNTER — Ambulatory Visit (INDEPENDENT_AMBULATORY_CARE_PROVIDER_SITE_OTHER): Payer: Medicare Other | Admitting: Internal Medicine

## 2012-08-28 DIAGNOSIS — Z7901 Long term (current) use of anticoagulants: Secondary | ICD-10-CM

## 2012-08-28 DIAGNOSIS — I2699 Other pulmonary embolism without acute cor pulmonale: Secondary | ICD-10-CM

## 2012-09-02 ENCOUNTER — Other Ambulatory Visit: Payer: Self-pay | Admitting: *Deleted

## 2012-09-02 MED ORDER — OXYCODONE-ACETAMINOPHEN 10-325 MG PO TABS: ORAL_TABLET | ORAL | Status: DC

## 2012-09-09 ENCOUNTER — Ambulatory Visit: Payer: Self-pay | Admitting: Nurse Practitioner

## 2012-09-10 ENCOUNTER — Ambulatory Visit: Payer: Self-pay | Admitting: Internal Medicine

## 2012-09-11 ENCOUNTER — Other Ambulatory Visit: Payer: Self-pay | Admitting: Geriatric Medicine

## 2012-09-11 MED ORDER — WARFARIN SODIUM 5 MG PO TABS: ORAL_TABLET | ORAL | Status: DC

## 2012-09-13 ENCOUNTER — Encounter: Payer: Self-pay | Admitting: *Deleted

## 2012-09-16 ENCOUNTER — Ambulatory Visit (INDEPENDENT_AMBULATORY_CARE_PROVIDER_SITE_OTHER): Payer: Medicare Other | Admitting: Pharmacist

## 2012-09-16 ENCOUNTER — Ambulatory Visit: Payer: Medicare Other | Admitting: Nurse Practitioner

## 2012-09-16 ENCOUNTER — Encounter: Payer: Self-pay | Admitting: *Deleted

## 2012-09-16 DIAGNOSIS — I2699 Other pulmonary embolism without acute cor pulmonale: Secondary | ICD-10-CM

## 2012-09-16 DIAGNOSIS — Z7901 Long term (current) use of anticoagulants: Secondary | ICD-10-CM

## 2012-09-16 LAB — POCT INR: INR: 1.6

## 2012-09-17 ENCOUNTER — Other Ambulatory Visit (HOSPITAL_COMMUNITY): Payer: Self-pay | Admitting: Internal Medicine

## 2012-09-17 ENCOUNTER — Ambulatory Visit (INDEPENDENT_AMBULATORY_CARE_PROVIDER_SITE_OTHER): Payer: Medicare Other | Admitting: Nurse Practitioner

## 2012-09-17 ENCOUNTER — Encounter: Payer: Self-pay | Admitting: Nurse Practitioner

## 2012-09-17 VITALS — BP 136/68 | HR 66 | Temp 97.4°F | Resp 14 | Ht 67.0 in | Wt 329.4 lb

## 2012-09-17 DIAGNOSIS — K625 Hemorrhage of anus and rectum: Secondary | ICD-10-CM

## 2012-09-17 DIAGNOSIS — K219 Gastro-esophageal reflux disease without esophagitis: Secondary | ICD-10-CM

## 2012-09-17 DIAGNOSIS — G4700 Insomnia, unspecified: Secondary | ICD-10-CM | POA: Insufficient documentation

## 2012-09-17 DIAGNOSIS — R1310 Dysphagia, unspecified: Secondary | ICD-10-CM

## 2012-09-17 MED ORDER — PANTOPRAZOLE SODIUM 40 MG PO TBEC: 40.0000 mg | DELAYED_RELEASE_TABLET | Freq: Every day | ORAL | Status: DC

## 2012-09-17 NOTE — Assessment & Plan Note (Signed)
May use melatonin 3 mg before bed

## 2012-09-17 NOTE — Patient Instructions (Addendum)
Gastroesophageal Reflux Disease, Adult Gastroesophageal reflux disease (GERD) happens when acid from your stomach flows up into the esophagus. When acid comes in contact with the esophagus, the acid causes soreness (inflammation) in the esophagus. Over time, GERD may create small holes (ulcers) in the lining of the esophagus. CAUSES   Increased body weight. This puts pressure on the stomach, making acid rise from the stomach into the esophagus.  Smoking. This increases acid production in the stomach.  Drinking alcohol. This causes decreased pressure in the lower esophageal sphincter (valve or ring of muscle between the esophagus and stomach), allowing acid from the stomach into the esophagus.  Late evening meals and a full stomach. This increases pressure and acid production in the stomach.  A malformed lower esophageal sphincter. Sometimes, no cause is found. SYMPTOMS   Burning pain in the lower part of the mid-chest behind the breastbone and in the mid-stomach area. This may occur twice a week or more often.  Trouble swallowing.  Sore throat.  Dry cough.  Asthma-like symptoms including chest tightness, shortness of breath, or wheezing. DIAGNOSIS  Your caregiver may be able to diagnose GERD based on your symptoms. In some cases, X-rays and other tests may be done to check for complications or to check the condition of your stomach and esophagus. TREATMENT  Your caregiver may recommend over-the-counter or prescription medicines to help decrease acid production. Ask your caregiver before starting or adding any new medicines.  HOME CARE INSTRUCTIONS   Change the factors that you can control. Ask your caregiver for guidance concerning weight loss, quitting smoking, and alcohol consumption.  Avoid foods and drinks that make your symptoms worse, such as:  Caffeine or alcoholic drinks.  Chocolate.  Peppermint or mint flavorings.  Garlic and onions.  Spicy foods.  Citrus fruits,  such as oranges, lemons, or limes.  Tomato-based foods such as sauce, chili, salsa, and pizza.  Fried and fatty foods.  Avoid lying down for the 3 hours prior to your bedtime or prior to taking a nap.  Eat small, frequent meals instead of large meals.  Wear loose-fitting clothing. Do not wear anything tight around your waist that causes pressure on your stomach.  Raise the head of your bed 6 to 8 inches with wood blocks to help you sleep. Extra pillows will not help.  Only take over-the-counter or prescription medicines for pain, discomfort, or fever as directed by your caregiver.  Do not take aspirin, ibuprofen, or other nonsteroidal anti-inflammatory drugs (NSAIDs). SEEK IMMEDIATE MEDICAL CARE IF:   You have pain in your arms, neck, jaw, teeth, or back.  Your pain increases or changes in intensity or duration.  You develop nausea, vomiting, or sweating (diaphoresis).  You develop shortness of breath, or you faint.  Your vomit is green, yellow, black, or looks like coffee grounds or blood.  Your stool is red, bloody, or black. These symptoms could be signs of other problems, such as heart disease, gastric bleeding, or esophageal bleeding. MAKE SURE YOU:   Understand these instructions.  Will watch your condition.  Will get help right away if you are not doing well or get worse. Document Released: 02/01/2005 Document Revised: 07/17/2011 Document Reviewed: 11/11/2010 [facility] Patient Information 2013 [place].  Insomnia Insomnia is frequent trouble falling and/or staying asleep. Insomnia can be a long term problem or a short term problem. Both are common. Insomnia can be a short term problem when the wakefulness is related to a certain stress or worry. Long term  insomnia is often related to ongoing stress during waking hours and/or poor sleeping habits. Overtime, sleep deprivation itself can make the problem worse. Every little thing feels more severe because you are  overtired and your ability to cope is decreased. CAUSES   Stress, anxiety, and depression.  Poor sleeping habits.  Distractions such as TV in the bedroom.  Naps close to bedtime.  Engaging in emotionally charged conversations before bed.  Technical reading before sleep.  Alcohol and other sedatives. They may make the problem worse. They can hurt normal sleep patterns and normal dream activity.  Stimulants such as caffeine for several hours prior to bedtime.  Pain syndromes and shortness of breath can cause insomnia.  Exercise late at night.  Changing time zones may cause sleeping problems (jet lag). It is sometimes helpful to have someone observe your sleeping patterns. They should look for periods of not breathing during the night (sleep apnea). They should also look to see how long those periods last. If you live alone or observers are uncertain, you can also be observed at a sleep clinic where your sleep patterns will be professionally monitored. Sleep apnea requires a checkup and treatment. Give your caregivers your medical history. Give your caregivers observations your family has made about your sleep.  SYMPTOMS   Not feeling rested in the morning.  Anxiety and restlessness at bedtime.  Difficulty falling and staying asleep. TREATMENT   Your caregiver may prescribe treatment for an underlying medical disorders. Your caregiver can give advice or help if you are using alcohol or other drugs for self-medication. Treatment of underlying problems will usually eliminate insomnia problems.  Medications can be prescribed for short time use. They are generally not recommended for lengthy use.  Over-the-counter sleep medicines are not recommended for lengthy use. They can be habit forming.  You can promote easier sleeping by making lifestyle changes such as:  Using relaxation techniques that help with breathing and reduce muscle tension.  Exercising earlier in the  day.  Changing your diet and the time of your last meal. No night time snacks.  Establish a regular time to go to bed.  Counseling can help with stressful problems and worry.  Soothing music and white noise may be helpful if there are background noises you cannot remove.  Stop tedious detailed work at least one hour before bedtime. HOME CARE INSTRUCTIONS   Keep a diary. Inform your caregiver about your progress. This includes any medication side effects. See your caregiver regularly. Take note of:  Times when you are asleep.  Times when you are awake during the night.  The quality of your sleep.  How you feel the next day. This information will help your caregiver care for you.  Get out of bed if you are still awake after 15 minutes. Read or do some quiet activity. Keep the lights down. Wait until you feel sleepy and go back to bed.  Keep regular sleeping and waking hours. Avoid naps.  Exercise regularly.  Avoid distractions at bedtime. Distractions include watching television or engaging in any intense or detailed activity like attempting to balance the household checkbook.  Develop a bedtime ritual. Keep a familiar routine of bathing, brushing your teeth, climbing into bed at the same time each night, listening to soothing music. Routines increase the success of falling to sleep faster.  Use relaxation techniques. This can be using breathing and muscle tension release routines. It can also include visualizing peaceful scenes. You can also help control troubling  or intruding thoughts by keeping your mind occupied with boring or repetitive thoughts like the old concept of counting sheep. You can make it more creative like imagining planting one beautiful flower after another in your backyard garden.  During your day, work to eliminate stress. When this is not possible use some of the previous suggestions to help reduce the anxiety that accompanies stressful situations. MAKE SURE  YOU:   Understand these instructions.  Will watch your condition.  Will get help right away if you are not doing well or get worse. Document Released: 04/21/2000 Document Revised: 07/17/2011 Document Reviewed: 05/22/2007 [facility] Patient Information 2013 [place].

## 2012-09-17 NOTE — Progress Notes (Signed)
Patient ID: [name], female   DOB: 1935/05/05, 78 y.o.   MRN: [id number]   Allergies  Allergen Reactions  . Sulfonamide Derivatives Swelling    To mouth    Chief Complaint  Patient presents with  . dark tarry stool    HPI: Patient is a 78 y.o. AA female seen in the office today for not feeling well  Reports she was having dark tarry stools when she started taking all of her multiple vitamins- about 4 weeks ago. Stopped taken them about 2 weeks and now she said the stools are not black but she feels bad now that she has not taking the vitamins Also stomach has been hurting    Review of Systems:  Review of Systems  Constitutional: Positive for malaise/fatigue. Negative for fever and chills.  Respiratory: Positive for shortness of breath. Negative for cough.   Cardiovascular: Negative for chest pain.  Gastrointestinal: Positive for heartburn, constipation and melena. Negative for abdominal pain and diarrhea.  Genitourinary: Negative for dysuria.  Musculoskeletal: Positive for myalgias, back pain and joint pain.  Neurological: Positive for weakness. Negative for sensory change.  Psychiatric/Behavioral: The patient has insomnia.     Past Medical History  Diagnosis Date  . COPD (chronic obstructive pulmonary disease)     patient denies this  . Diabetes mellitus   . Diverticular disease   . Hypertension   . Hyperlipidemia   . Coronary artery disease     a. s/p IMI 2004 tx with BMS to RCA;  b. s/p Promus DES to RCA 2/12 (cath: LM 20-30%, pLAD 20-30%, mLAD 50%, RI 30%, pRCA 60%, mRCA 90% - tx with PCI);  c. myoview 1/07: EF 49%, inf scar, no isch  . Urinary incontinence   . Insomnia   . Osteoarthritis   . Morbid obesity   . Obstructive sleep apnea   . Polymyalgia rheumatica   . Anxiety   . Arthritis   . Depression   . Cholelithiasis   . CHF (congestive heart failure)   . GERD (gastroesophageal reflux disease)   . Pulmonary emboli 9/13    felt to need lifelong  anticoagulation  . Debility   . Unspecified constipation   . Allergy   . Gout, unspecified   . Dysuria   . Urinary complications   . Pain, chronic   . Allergic rhinitis due to pollen   . Osteoarthrosis, unspecified whether generalized or localized, lower leg   . Type II or unspecified type diabetes mellitus without mention of complication, uncontrolled   . Anemia, unspecified   . Coronary atherosclerosis of native coronary artery   . Other malaise and fatigue    Past Surgical History  Procedure Laterality Date  . Coronary artery bypass graft    . Appendectomy    . Cardiac catheterization    . Gastric bypass  1977    reversed in 1979  . Blood clots/legs and lungs  2013  . Mi with stent placement  2004   Social History:   reports that she has never smoked. She does not have any smokeless tobacco history on file. She reports that she does not drink alcohol or use illicit drugs.  Family History  Problem Relation Age of Onset  . Breast cancer Mother   . Heart disease Mother   . Throat cancer Father   . Hypertension Father   . Arthritis Father   . Diabetes Father   . Arthritis Sister   . Obesity Sister   . Diabetes  Sister     Medications: Patient's Medications  New Prescriptions   No medications on file  Previous Medications   CLONAZEPAM (KLONOPIN) 1 MG TABLET    TAKE 1 TABLET BY MOUTH EVERY MORNING AND 2 TABLETS AT BEDTIME FOR NERVES   COLCHICINE 0.6 MG TABLET    Take 1 tablet (0.6 mg total) by mouth daily.   DOCUSATE SODIUM (COLACE) 100 MG CAPSULE    Take 100 mg by mouth 3 (three) times daily as needed for constipation.   DULOXETINE (CYMBALTA) 60 MG CAPSULE    Take 1 capsule (60 mg total) by mouth daily.   EXENATIDE (BYETTA) 10 MCG/0.04ML SOLN    Inject 0.04 mLs (10 mcg total) into the skin 2 (two) times daily with a meal.   FUROSEMIDE (LASIX) 20 MG TABLET    Take 1 tablet (20 mg total) by mouth 2 (two) times daily.   GLUCOSE BLOOD (FREESTYLE LITE TEST VI)    by In  Vitro route. Use to check diabetes twice a day Dx. 250.02   INSULIN LISPRO, HUMAN, (HUMALOG KWIKPEN [place])    Inject into the skin. Inject 10 units before breakfast and supper. Dx.250.02   ISOSORBIDE MONONITRATE (IMDUR) 60 MG 24 HR TABLET    Take 60 mg by mouth daily. Take 1/2 tablet once a day for heart.   LANCETS (FREESTYLE) LANCETS    1 each by Other route as needed for other. Use to check diabetes twice a day Dx. 250.02   METOPROLOL SUCCINATE (TOPROL-XL) 25 MG 24 HR TABLET    Take 1 tablet (25 mg total) by mouth daily.   NITROGLYCERIN (NITROSTAT) 0.4 MG SL TABLET    Place 0.4 mg under the tongue every 5 (five) minutes as needed. For chest pain   OXYCODONE-ACETAMINOPHEN (PERCOCET) 10-325 MG PER TABLET    Take one tablet every 6 hours as needed for pain   POLYETHYLENE GLYCOL (MIRALAX / GLYCOLAX) PACKET    Take 17 g by mouth daily. Mix 17 grams in a 8 oz glass of water or juice as needed for constipation   PRAVASTATIN (PRAVACHOL) 20 MG TABLET    Take 1 tablet (20 mg total) by mouth daily.   SENNA (SENOKOT) 8.6 MG TABS    Take 1 tablet by mouth. Take two tablets a day   WARFARIN (COUMADIN) 5 MG TABLET    Take one and one half tablet on Monday, Wednesday, and Friday. Take one tablet Tuesday, Thursday, Saturday, and Sunday.  Modified Medications   Modified Medication Previous Medication   PANTOPRAZOLE (PROTONIX) 40 MG TABLET pantoprazole (PROTONIX) 40 MG tablet      Take 1 tablet (40 mg total) by mouth daily.      Discontinued Medications   ACETAMINOPHEN (TYLENOL) 500 MG TABLET    Take 500 mg by mouth every 6 (six) hours as needed for pain. Take one tablet once a day as needed for pain   DEXLANSOPRAZOLE 30 MG CAPSULE    Take 30 mg by mouth daily. Take one tablet once a day for acid reflux     Physical Exam:  Filed Vitals:   09/17/12 1309  BP: 136/68  Pulse: 66  Temp: 97.4 F (36.3 C)  Resp: 14  Height: 5\' 7"  (1.702 m)  Weight: 329 lb 6.4 oz (149.415 kg)    Physical Exam  Nursing note  and vitals reviewed. Constitutional: She is well-developed, well-nourished, and in no distress. No distress.   Morbidly Obese   Cardiovascular: Normal rate, regular rhythm and  normal heart sounds.   Pulmonary/Chest: Effort normal and breath sounds normal. No respiratory distress.  Abdominal: Soft. Bowel sounds are normal.  Genitourinary: Rectal exam shows no external hemorrhoid and no internal hemorrhoid. Guaiac negative stool.  Skin: Skin is warm and dry. She is not diaphoretic.      Assessment/Plan RECTAL BLEEDING History of rectal bleeding- pt reports seeing dark tarry stools. Today stool is brown without signs of bleeding; pt on coumadin; will get CBC at this time.   GERD (gastroesophageal reflux disease) Pt reports she tolerated protonix better and had better relief with this vs dexalant. Will change medications back to Protonix. Will need barium study previously ordered by Bubba Camp, MD.   Insomnia May use melatonin 3 mg before bed      Labs/tests ordered: cbc

## 2012-09-17 NOTE — Assessment & Plan Note (Signed)
History of rectal bleeding- pt reports seeing dark tarry stools. Today stool is brown without signs of bleeding; pt on coumadin; will get CBC at this time.

## 2012-09-17 NOTE — Progress Notes (Signed)
[facility] # [id number] Appt: 09/19/2012 @ 10:45 Patient is Aware and appointment given to patient and her family. [facility] Scheduler has been trying to call and schedule patient an appointment for the past 2 months and patient will not return call Patient has cancelled appointments for this in the past.

## 2012-09-17 NOTE — Assessment & Plan Note (Addendum)
Pt reports she tolerated protonix better and had better relief with this vs dexalant. Will change medications back to Protonix. Will need barium study previously ordered by Bubba Camp, MD.

## 2012-09-18 LAB — CBC WITH DIFFERENTIAL/PLATELET
Basophils Absolute: 0 10*3/uL (ref 0.0–0.2)
Basos: 0 % (ref 0–3)
Eos: 2 % (ref 0–5)
Eosinophils Absolute: 0.2 10*3/uL (ref 0.0–0.4)
HCT: 33 % — ABNORMAL LOW (ref 34.0–46.6)
Hemoglobin: 10.6 g/dL — ABNORMAL LOW (ref 11.1–15.9)
Immature Grans (Abs): 0 10*3/uL (ref 0.0–0.1)
Immature Granulocytes: 0 % (ref 0–2)
Lymphocytes Absolute: 4.2 10*3/uL — ABNORMAL HIGH (ref 0.7–3.1)
Lymphs: 41 % (ref 14–46)
MCH: 27.2 pg (ref 26.6–33.0)
MCHC: 32.1 g/dL (ref 31.5–35.7)
MCV: 85 fL (ref 79–97)
Monocytes Absolute: 0.5 10*3/uL (ref 0.1–0.9)
Monocytes: 5 % (ref 4–12)
Neutrophils Absolute: 5.3 10*3/uL (ref 1.4–7.0)
Neutrophils Relative %: 52 % (ref 40–74)
RBC: 3.9 x10E6/uL (ref 3.77–5.28)
RDW: 15.2 % (ref 12.3–15.4)
WBC: 10.2 10*3/uL (ref 3.4–10.8)

## 2012-09-19 ENCOUNTER — Ambulatory Visit (HOSPITAL_COMMUNITY)
Admission: RE | Admit: 2012-09-19 | Discharge: 2012-09-19 | Disposition: A | Discharge status: Home / Self Care | Payer: Medicare Other | Source: Ambulatory Visit | Attending: Internal Medicine | Admitting: Internal Medicine

## 2012-09-19 DIAGNOSIS — F458 Other somatoform disorders: Secondary | ICD-10-CM | POA: Insufficient documentation

## 2012-09-19 DIAGNOSIS — R1310 Dysphagia, unspecified: Secondary | ICD-10-CM

## 2012-09-19 DIAGNOSIS — R1319 Other dysphagia: Secondary | ICD-10-CM | POA: Insufficient documentation

## 2012-09-19 NOTE — Procedures (Signed)
Objective Swallowing Evaluation: Modified Barium Swallowing Study  Patient Details  Name: [name] MRN: [id number] Date of Birth: 02-11-1935  Today's Date: 09/19/2012 Time: [id number] SLP Time Calculation (min): 25 min  Past Medical History:  Past Medical History  Diagnosis Date  . COPD (chronic obstructive pulmonary disease)     patient denies this  . Diabetes mellitus   . Diverticular disease   . Hypertension   . Hyperlipidemia   . Coronary artery disease     a. s/p IMI 2004 tx with BMS to RCA;  b. s/p Promus DES to RCA 2/12 (cath: LM 20-30%, pLAD 20-30%, mLAD 50%, RI 30%, pRCA 60%, mRCA 90% - tx with PCI);  c. myoview 1/07: EF 49%, inf scar, no isch  . Urinary incontinence   . Insomnia   . Osteoarthritis   . Morbid obesity   . Obstructive sleep apnea   . Polymyalgia rheumatica   . Anxiety   . Arthritis   . Depression   . Cholelithiasis   . CHF (congestive heart failure)   . GERD (gastroesophageal reflux disease)   . Pulmonary emboli 9/13    felt to need lifelong anticoagulation  . Debility   . Unspecified constipation   . Allergy   . Gout, unspecified   . Dysuria   . Urinary complications   . Pain, chronic   . Allergic rhinitis due to pollen   . Osteoarthrosis, unspecified whether generalized or localized, lower leg   . Type II or unspecified type diabetes mellitus without mention of complication, uncontrolled   . Anemia, unspecified   . Coronary atherosclerosis of native coronary artery   . Other malaise and fatigue    Past Surgical History:  Past Surgical History  Procedure Laterality Date  . Coronary artery bypass graft    . Appendectomy    . Cardiac catheterization    . Gastric bypass  1977    reversed in 1979  . Blood clots/legs and lungs  2013  . Mi with stent placement  [id number]   HPI:  78 year old female with c/o globus and painful swallow seen for OP MBS.      Assessment / Plan / Recommendation Clinical Impression  Dysphagia Diagnosis:  Suspected primary esophageal dysphagia Clinical impression: MBS complete. Suspect patient with a primary esophageal dysphagia given a functional oropharyngeal swallow with full airway protection despite continued c/o globus and painful swallow. Unable to sweep esophagus to view due to patients large size. Recommend continuation of a regular diet with general safe swallowing and reflux precautions. Education complete with patient and daughter. Defer further w/u of possible esophageal dysfunction/GER to MD. No f/u SLP indicated at this time.     Treatment Recommendation  No treatment recommended at this time    Diet Recommendation Regular;Thin liquid   Liquid Administration via: Cup;Straw Medication Administration: Whole meds with liquid Supervision: Patient able to self feed Compensations: Slow rate;Small sips/bites Postural Changes and/or Swallow Maneuvers: Seated upright 90 degrees;Upright 30-60 min after meal    Other  Recommendations Oral Care Recommendations: Oral care BID   Follow Up Recommendations  None       Pertinent Vitals/Pain None reported     General HPI: 78 year old female with c/o globus and painful swallow seen for OP MBS.  Type of Study: Modified Barium Swallowing Study Reason for Referral: Objectively evaluate swallowing function Previous Swallow Assessment: none Diet Prior to this Study: Regular;Thin liquids Temperature Spikes Noted: No Respiratory Status: Room air History of Recent  Intubation: No Behavior/Cognition: Alert;Cooperative;Pleasant mood Oral Cavity - Dentition: Dentures, top;Dentures, bottom Oral Motor / Sensory Function: Within functional limits Self-Feeding Abilities: Able to feed self Patient Positioning: Upright in chair Baseline Vocal Quality: Clear Volitional Cough: Strong Volitional Swallow: Able to elicit Anatomy: Within functional limits Pharyngeal Secretions: Not observed secondary MBS    Reason for Referral Objectively evaluate  swallowing function   Oral Phase Oral Preparation/Oral Phase Oral Phase: WFL   Pharyngeal Phase Pharyngeal Phase Pharyngeal Phase: Impaired Pharyngeal - Thin Pharyngeal - Thin Straw: Pharyngeal residue - pyriform sinuses Pharyngeal - Solids Pharyngeal - Puree: Within functional limits Pharyngeal - Regular: Within functional limits Pharyngeal - Pill: Within functional limits  Cervical Esophageal Phase    GO    Cervical Esophageal Phase Cervical Esophageal Phase: [facility]    Functional Assessment Tool Used: skilled clinical judgement Functional Limitations: Swallowing Swallow Current Status [id number]): At least 1 percent but less than 20 percent impaired, limited or restricted Swallow Goal Status [phone]): At least 1 percent but less than 20 percent impaired, limited or restricted Swallow Discharge Status [phone]): At least 1 percent but less than 20 percent impaired, limited or restricted   [place], [place] [phone]  [place] 09/19/2012, 1:41 PM

## 2012-09-20 ENCOUNTER — Other Ambulatory Visit: Payer: Medicare Other

## 2012-09-23 ENCOUNTER — Other Ambulatory Visit: Payer: Self-pay | Admitting: *Deleted

## 2012-09-23 MED ORDER — CLONAZEPAM 1 MG PO TABS: ORAL_TABLET | ORAL | Status: DC

## 2012-09-24 ENCOUNTER — Ambulatory Visit: Payer: Medicare Other | Admitting: Nurse Practitioner

## 2012-10-03 ENCOUNTER — Other Ambulatory Visit: Payer: Self-pay | Admitting: Geriatric Medicine

## 2012-10-03 MED ORDER — OXYCODONE-ACETAMINOPHEN 10-325 MG PO TABS: ORAL_TABLET | ORAL | Status: DC

## 2012-10-07 ENCOUNTER — Ambulatory Visit (INDEPENDENT_AMBULATORY_CARE_PROVIDER_SITE_OTHER): Payer: Medicare Other | Admitting: *Deleted

## 2012-10-07 DIAGNOSIS — I2699 Other pulmonary embolism without acute cor pulmonale: Secondary | ICD-10-CM

## 2012-10-07 DIAGNOSIS — Z7901 Long term (current) use of anticoagulants: Secondary | ICD-10-CM

## 2012-10-07 LAB — POCT INR: INR: 1.7

## 2012-10-28 ENCOUNTER — Ambulatory Visit (INDEPENDENT_AMBULATORY_CARE_PROVIDER_SITE_OTHER): Payer: Medicare Other | Admitting: *Deleted

## 2012-10-28 DIAGNOSIS — Z7901 Long term (current) use of anticoagulants: Secondary | ICD-10-CM

## 2012-10-28 DIAGNOSIS — I2699 Other pulmonary embolism without acute cor pulmonale: Secondary | ICD-10-CM

## 2012-10-28 LAB — POCT INR: INR: 2

## 2012-11-04 ENCOUNTER — Other Ambulatory Visit (HOSPITAL_COMMUNITY): Payer: Self-pay | Admitting: Internal Medicine

## 2012-11-05 ENCOUNTER — Other Ambulatory Visit: Payer: Self-pay | Admitting: *Deleted

## 2012-11-05 MED ORDER — DOCUSATE SODIUM 100 MG PO CAPS: 100.0000 mg | ORAL_CAPSULE | Freq: Three times a day (TID) | ORAL | Status: DC | PRN

## 2012-11-14 ENCOUNTER — Encounter: Payer: Self-pay | Admitting: Nurse Practitioner

## 2012-11-14 ENCOUNTER — Ambulatory Visit (INDEPENDENT_AMBULATORY_CARE_PROVIDER_SITE_OTHER): Payer: Medicare Other | Admitting: Nurse Practitioner

## 2012-11-14 VITALS — BP 130/78 | HR 72 | Temp 97.8°F | Resp 22

## 2012-11-14 DIAGNOSIS — J209 Acute bronchitis, unspecified: Secondary | ICD-10-CM

## 2012-11-14 NOTE — Progress Notes (Signed)
Patient ID: [name], female   DOB: 1934-08-07, 78 y.o.   MRN: [id number]   Allergies  Allergen Reactions  . Sulfonamide Derivatives Swelling    To mouth    Chief Complaint  Patient presents with  . Acute Visit    Bronchitis, L Shoulder pain x 2 yrs    HPI: Patient is a 78 y.o. female seen in the office today for follow up bronchitis and difficult breathing.  Went to urgent care 3 days ago and was started on Z- pack; taking Mucinex DM,  alkesizure  Still not feeling any better and having to cough and congestion which makes it more difficult to breath  Pt with hx of chronic shortness of breath, morbid obesity htn, constipation, orthopnea  No fevers or chills or worsening of symtoms  Review of Systems:  Review of Systems  Constitutional: Negative for fever, chills and malaise/fatigue.  HENT: Positive for congestion.   Eyes: Negative.   Respiratory: Positive for cough and shortness of breath (shortness of breath is chronic- no worsening of this ). Negative for sputum production.   Cardiovascular: Negative for chest pain and leg swelling.  Neurological: Negative for weakness and headaches.     Past Medical History  Diagnosis Date  . COPD (chronic obstructive pulmonary disease)     patient denies this  . Diabetes mellitus   . Diverticular disease   . Hypertension   . Hyperlipidemia   . Coronary artery disease     a. s/p IMI 2004 tx with BMS to RCA;  b. s/p Promus DES to RCA 2/12 (cath: LM 20-30%, pLAD 20-30%, mLAD 50%, RI 30%, pRCA 60%, mRCA 90% - tx with PCI);  c. myoview 1/07: EF 49%, inf scar, no isch  . Urinary incontinence   . Insomnia   . Osteoarthritis   . Morbid obesity   . Obstructive sleep apnea   . Polymyalgia rheumatica   . Anxiety   . Arthritis   . Depression   . Cholelithiasis   . CHF (congestive heart failure)   . GERD (gastroesophageal reflux disease)   . Pulmonary emboli 9/13    felt to need lifelong anticoagulation  . Debility   . Unspecified  constipation   . Allergy   . Gout, unspecified   . Dysuria   . Urinary complications   . Pain, chronic   . Allergic rhinitis due to pollen   . Osteoarthrosis, unspecified whether generalized or localized, lower leg   . Type II or unspecified type diabetes mellitus without mention of complication, uncontrolled   . Anemia, unspecified   . Coronary atherosclerosis of native coronary artery   . Other malaise and fatigue    Past Surgical History  Procedure Laterality Date  . Coronary artery bypass graft    . Appendectomy    . Cardiac catheterization    . Gastric bypass  1977    reversed in 1979  . Blood clots/legs and lungs  2013  . Mi with stent placement  2004   Social History:   reports that she has never smoked. She does not have any smokeless tobacco history on file. She reports that she does not drink alcohol or use illicit drugs.  Family History  Problem Relation Age of Onset  . Breast cancer Mother   . Heart disease Mother   . Throat cancer Father   . Hypertension Father   . Arthritis Father   . Diabetes Father   . Arthritis Sister   . Obesity Sister   .  Diabetes Sister     Medications: Patient's Medications  New Prescriptions   No medications on file  Previous Medications   AZITHROMYCIN (ZITHROMAX) 250 MG TABLET       CLONAZEPAM (KLONOPIN) 1 MG TABLET    Take one tablet in the morning, and two at bedtime for sleep   COLCHICINE 0.6 MG TABLET    Take 1 tablet (0.6 mg total) by mouth daily.   DOCUSATE SODIUM (COLACE) 100 MG CAPSULE    Take 1 capsule (100 mg total) by mouth 3 (three) times daily as needed for constipation.   DULOXETINE (CYMBALTA) 60 MG CAPSULE    Take 1 capsule (60 mg total) by mouth daily.   EXENATIDE (BYETTA) 10 MCG/0.04ML SOLN    Inject 0.04 mLs (10 mcg total) into the skin 2 (two) times daily with a meal.   FUROSEMIDE (LASIX) 20 MG TABLET    Take 1 tablet (20 mg total) by mouth 2 (two) times daily.   GLUCOSE BLOOD (FREESTYLE LITE TEST VI)    by  In Vitro route. Use to check diabetes twice a day Dx. 250.02   INSULIN LISPRO, HUMAN, (HUMALOG KWIKPEN [place])    Inject into the skin. Inject 10 units before breakfast and supper. Dx.250.02   ISOSORBIDE MONONITRATE (IMDUR) 60 MG 24 HR TABLET    Take 60 mg by mouth daily. Take 1/2 tablet once a day for heart.   LANCETS (FREESTYLE) LANCETS    1 each by Other route as needed for other. Use to check diabetes twice a day Dx. 250.02   LANTUS SOLOSTAR 100 UNIT/ML SOPN       METOPROLOL SUCCINATE (TOPROL-XL) 25 MG 24 HR TABLET    Take 1 tablet (25 mg total) by mouth daily.   NITROGLYCERIN (NITROSTAT) 0.4 MG SL TABLET    Place 0.4 mg under the tongue every 5 (five) minutes as needed. For chest pain   OXYCODONE HCL 10 MG TABS       OXYCODONE-ACETAMINOPHEN (PERCOCET) 10-325 MG PER TABLET    Take one tablet every 6 hours as needed for pain   PANTOPRAZOLE (PROTONIX) 40 MG TABLET    Take 1 tablet (40 mg total) by mouth daily.   POLYETHYLENE GLYCOL (MIRALAX / GLYCOLAX) PACKET    Take 17 g by mouth daily. Mix 17 grams in a 8 oz glass of water or juice as needed for constipation   PRAVASTATIN (PRAVACHOL) 20 MG TABLET    TAKE 1 TABLET BY MOUTH ONCE EVERY DAY FOR CHOLESTEROL   SENNA (SENOKOT) 8.6 MG TABS    Take 1 tablet by mouth. Take two tablets a day   WARFARIN (COUMADIN) 5 MG TABLET    Take one and one half tablet on Monday, Wednesday, and Friday. Take one tablet Tuesday, Thursday, Saturday, and Sunday.  Modified Medications   No medications on file  Discontinued Medications   No medications on file     Physical Exam:  Filed Vitals:   11/14/12 0910  BP: 130/78  Pulse: 72  Temp: 97.8 F (36.6 C)  TempSrc: Oral  Resp: 22  SpO2: 99%    Physical Exam  Vitals reviewed. Constitutional: She is oriented to person, place, and time and well-developed, well-nourished, and in no distress. No distress.  Neck: Normal range of motion. Neck supple. No JVD present. No thyromegaly present.  Cardiovascular: Normal  rate, regular rhythm and normal heart sounds.   Pulmonary/Chest: Effort normal and breath sounds normal. No respiratory distress.  Lymphadenopathy:    She  has no cervical adenopathy.  Neurological: She is alert and oriented to person, place, and time.  Skin: Skin is warm and dry. She is not diaphoretic.     Labs reviewed: Basic Metabolic Panel:  Recent Labs  01/26/12 0306  01/31/12 0540 02/01/12 0545 05/13/12 07/31/12 0905  NA 133*  < > 137 137 137 138  K 5.2*  < > 4.5 4.5 4.4 4.7  CL 100  < > 100 99  --  100  CO2 20  < > 27 26  --  29  GLUCOSE 214*  < > 173* 167*  --  209*  BUN 20  < > 14 16 28* 23  CREATININE 1.25*  < > 1.39* 1.41* 1.4* 1.3*  CALCIUM 8.5  < > 8.7 9.1  --  8.9  TSH 0.785  --   --   --   --   --   < > = values in this interval not displayed. Liver Function Tests:  Recent Labs  01/25/12 1921 05/13/12  AST 24 12*  ALT 21 18  ALKPHOS 80 86  BILITOT 0.3  --   PROT 7.5  --   ALBUMIN 3.2*  --     Recent Labs  01/25/12 1921  LIPASE 52   No results found for this basename: AMMONIA,  in the last 8760 hours CBC:  Recent Labs  01/30/12 0012 05/13/12 07/31/12 0905 09/17/12 1421  WBC 10.3  --  10.0 10.2  NEUTROABS  --  4 5.7 5.3  HGB 8.8* 9.8* 11.0* 10.6*  HCT 27.7* 31* 34.2* 33.0*  MCV 86.3  --  84.2 85  PLT 255 321 319.0  --    Lipid Panel:  Recent Labs  05/13/12  CHOL 136  HDL 39  LDLCALC 65  TRIG 160    Past Procedures:     Assessment/Plan Acute bronchitis Cont z pack until finished; cont mucinex DM 1-2 tablet q 12 hours with full glass of water to help cough and congestion; to follow up if symptoms do not improve after a week or she has worsening shortness of breath, fever, or productive sputum

## 2012-11-14 NOTE — Patient Instructions (Addendum)
Cont Z Pack Cont Mucinex DM 1-2 tablet every 12 hours for 1 week for cough and congestion  Follow up as needed if you are getting worse and after 1 week if you are no better    Bronchitis Bronchitis is the body's way of reacting to injury and/or infection (inflammation) of the bronchi. Bronchi are the air tubes that extend from the windpipe into the lungs. If the inflammation becomes severe, it may cause shortness of breath. CAUSES  Inflammation may be caused by:  A virus.  Germs (bacteria).  Dust.  Allergens.  Pollutants and many other irritants. The cells lining the bronchial tree are covered with tiny hairs (cilia). These constantly beat upward, away from the lungs, toward the mouth. This keeps the lungs free of pollutants. When these cells become too irritated and are unable to do their job, mucus begins to develop. This causes the characteristic cough of bronchitis. The cough clears the lungs when the cilia are unable to do their job. Without either of these protective mechanisms, the mucus would settle in the lungs. Then you would develop pneumonia. Smoking is a common cause of bronchitis and can contribute to pneumonia. Stopping this habit is the single most important thing you can do to help yourself. TREATMENT   Your caregiver may prescribe an antibiotic if the cough is caused by bacteria. Also, medicines that open up your airways make it easier to breathe. Your caregiver may also recommend or prescribe an expectorant. It will loosen the mucus to be coughed up. Only take over-the-counter or prescription medicines for pain, discomfort, or fever as directed by your caregiver.  Removing whatever causes the problem (smoking, for example) is critical to preventing the problem from getting worse.  Cough suppressants may be prescribed for relief of cough symptoms.  Inhaled medicines may be prescribed to help with symptoms now and to help prevent problems from returning.  For those  with recurrent (chronic) bronchitis, there may be a need for steroid medicines. SEEK IMMEDIATE MEDICAL CARE IF:   During treatment, you develop more pus-like mucus (purulent sputum).  You have a fever.  Your baby is older than 3 months with a rectal temperature of 102 F (38.9 C) or higher.  Your baby is 72 months old or younger with a rectal temperature of 100.4 F (38 C) or higher.  You become progressively more ill.  You have increased difficulty breathing, wheezing, or shortness of breath. It is necessary to seek immediate medical care if you are elderly or sick from any other disease. MAKE SURE YOU:   Understand these instructions.  Will watch your condition.  Will get help right away if you are not doing well or get worse. Document Released: 04/24/2005 Document Revised: 07/17/2011 Document Reviewed: 03/03/2008 [facility] Patient Information 2014 [place].

## 2012-11-20 ENCOUNTER — Ambulatory Visit: Payer: Medicare Other | Admitting: Nurse Practitioner

## 2012-12-02 ENCOUNTER — Other Ambulatory Visit: Payer: Self-pay | Admitting: Geriatric Medicine

## 2012-12-02 MED ORDER — OXYCODONE-ACETAMINOPHEN 10-325 MG PO TABS: ORAL_TABLET | ORAL | Status: DC

## 2012-12-06 ENCOUNTER — Other Ambulatory Visit (HOSPITAL_COMMUNITY): Payer: Self-pay | Admitting: Internal Medicine

## 2012-12-09 ENCOUNTER — Other Ambulatory Visit (HOSPITAL_COMMUNITY): Payer: Self-pay | Admitting: Internal Medicine

## 2012-12-15 ENCOUNTER — Other Ambulatory Visit (HOSPITAL_COMMUNITY): Payer: Self-pay | Admitting: Internal Medicine

## 2012-12-23 ENCOUNTER — Other Ambulatory Visit (HOSPITAL_COMMUNITY): Payer: Self-pay | Admitting: Internal Medicine

## 2012-12-23 ENCOUNTER — Ambulatory Visit (INDEPENDENT_AMBULATORY_CARE_PROVIDER_SITE_OTHER): Payer: Medicare Other | Admitting: Pharmacotherapy

## 2012-12-23 ENCOUNTER — Encounter: Payer: Self-pay | Admitting: Pharmacotherapy

## 2012-12-23 VITALS — BP 140/82 | HR 72 | Temp 98.7°F | Resp 18 | Ht 67.0 in | Wt 346.0 lb

## 2012-12-23 DIAGNOSIS — E1165 Type 2 diabetes mellitus with hyperglycemia: Secondary | ICD-10-CM

## 2012-12-23 DIAGNOSIS — E1129 Type 2 diabetes mellitus with other diabetic kidney complication: Secondary | ICD-10-CM

## 2012-12-23 DIAGNOSIS — N183 Chronic kidney disease, stage 3 unspecified: Secondary | ICD-10-CM

## 2012-12-23 MED ORDER — INSULIN DETEMIR 100 UNIT/ML FLEXPEN: 70.0000 [IU] | PEN_INJECTOR | Freq: Every day | SUBCUTANEOUS | Status: DC

## 2012-12-23 MED ORDER — INSULIN PEN NEEDLE 32G X 4 MM MISC: Status: DC

## 2012-12-23 MED ORDER — EXENATIDE 10 MCG/0.04ML ~~LOC~~ SOPN: 10.0000 ug | PEN_INJECTOR | Freq: Two times a day (BID) | SUBCUTANEOUS | Status: DC

## 2012-12-23 NOTE — Patient Instructions (Addendum)
Needs to move daily.  Goal is 30-45 minutes 5 x week.  Start with 5 minutes.

## 2012-12-23 NOTE — Progress Notes (Signed)
  Subjective:    [name] is a 77 y.o. African American female who presents for follow-up of Type 2 diabetes mellitus.   Her warfarin is managed by [facility]. She is interested in changing her long acting insulin. She is currently on Lantus 65 units daily. She has not taken her blood glucose in over a week.  Lost her meter.  She she was ranging 169-200 before she lost her meter. Denies hypoglycemia. Some peripheral edema. Denies problems with feet. She had an eye exam in January 2014.  Some difficulty seeing TV. Nocturia at least twice per night, sometimes more. She stays hungry all the time.  No longer taking Byetta.    Review of Systems A comprehensive review of systems was negative except for: Eyes: positive for visual disturbance Cardiovascular: positive for lower extremity edema Genitourinary: positive for nocturia Endocrine: positive for diabetic symptoms including blurry vision, polydipsia and polyphagia    Objective:    BP 140/82  Pulse 72  Temp(Src) 98.7 F (37.1 C) (Oral)  Resp 18  Ht 5\' 7"  (1.702 m)  Wt 346 lb (156.945 kg)  BMI 54.18 kg/m2  SpO2 99%  General:  alert, cooperative, appears stated age, no distress and morbidly obese  Oropharynx: normal findings: lips normal without lesions and gums healthy   Eyes:  negative findings: lids and lashes normal and corneas clear   Ears:  external ears normal        Lung: clear to auscultation bilaterally  Heart:  regular rate and rhythm     Extremities: edema blialteral lower extremities  Skin: warm and dry     Neuro: mental status, speech normal, alert and oriented x3 and using a wheelchair   Lab Review Glucose, Bld (mg/dL)  Date Value  07/31/2012 209*  02/01/2012 167*  01/31/2012 173*     CO2 (mEq/L)  Date Value  07/31/2012 29   02/01/2012 26   01/31/2012 27      BUN (mg/dL)  Date Value  07/31/2012 23   05/13/2012 28*  02/01/2012 16   01/31/2012 14      Creat (mg/dL)  Date Value   10/21/2010 1.33*     Creatinine (mg/dL)  Date Value  05/13/2012 1.4*     Creatinine, Ser (mg/dL)  Date Value  07/31/2012 1.3*  02/01/2012 1.41*  01/31/2012 1.39*    Random blood glucose today:  223 mg/dl   Assessment:    Diabetes Mellitus type II, under poor control.  HTN goal <140/80 Weight loss goal is 10% of total body weight   Plan:    1.  Rx changes: will change Lantus to Levemir by request.  Increase basal insulin dose to 70 units daily (split into 2 injections of 35 units for improved absorption).  Restart Byetta 48mcg twice daily.  Goal is to convert to once weekly Bydureon when pen device is available (roughly 6 weeks). 2.  Counseled on nutrition goals and need for good hydration. 3.  Counseled on benefit of routine exercise.  Goal is for 30-45 minutes 5 x week.. 4.  HTN slightly above target <140/80 today.  Will continue current RX and monitor. 5.  Morbid obesity - resuming Byetta may help with appetite control as well as lower blood glucose. 6.  RTC in 6 weeks. Not on metformin due to SCr 1.4

## 2012-12-27 ENCOUNTER — Telehealth: Payer: Self-pay | Admitting: Geriatric Medicine

## 2012-12-27 ENCOUNTER — Ambulatory Visit (INDEPENDENT_AMBULATORY_CARE_PROVIDER_SITE_OTHER): Payer: Medicare Other | Admitting: *Deleted

## 2012-12-27 DIAGNOSIS — I2699 Other pulmonary embolism without acute cor pulmonale: Secondary | ICD-10-CM

## 2012-12-27 DIAGNOSIS — Z7901 Long term (current) use of anticoagulants: Secondary | ICD-10-CM

## 2012-12-27 LAB — POCT INR: INR: 1.7

## 2012-12-27 NOTE — Telephone Encounter (Signed)
Patient left a message saying that she does not feel good and she wanted to verify her insulin doses. I called her back and did not get an answer. No voicemail.

## 2012-12-28 ENCOUNTER — Telehealth: Payer: Self-pay | Admitting: Internal Medicine

## 2012-12-28 NOTE — Telephone Encounter (Signed)
Patient took 75 units of Levemir last night and this morning instead of 35 units (note 8/18 [name]). However BS this am 383 and now 12;30 283. Advised to hold any further insulin. Recheck cbg ac dinner and To call result to me. She is not alone, daughter to be with her today. Discussed s/s of hypoglycemia

## 2012-12-30 ENCOUNTER — Ambulatory Visit: Payer: Medicare Other | Admitting: Nurse Practitioner

## 2013-01-01 ENCOUNTER — Ambulatory Visit: Payer: Self-pay | Admitting: Nurse Practitioner

## 2013-01-05 ENCOUNTER — Other Ambulatory Visit (HOSPITAL_COMMUNITY): Payer: Self-pay | Admitting: Internal Medicine

## 2013-01-05 ENCOUNTER — Other Ambulatory Visit: Payer: Self-pay | Admitting: Nurse Practitioner

## 2013-01-08 ENCOUNTER — Encounter: Payer: Self-pay | Admitting: Internal Medicine

## 2013-01-08 ENCOUNTER — Ambulatory Visit (INDEPENDENT_AMBULATORY_CARE_PROVIDER_SITE_OTHER): Payer: Medicare Other | Admitting: Internal Medicine

## 2013-01-08 VITALS — BP 142/80 | HR 81 | Temp 98.2°F | Resp 18 | Ht 67.0 in | Wt 343.8 lb

## 2013-01-08 DIAGNOSIS — E1129 Type 2 diabetes mellitus with other diabetic kidney complication: Secondary | ICD-10-CM | POA: Insufficient documentation

## 2013-01-08 DIAGNOSIS — Z23 Encounter for immunization: Secondary | ICD-10-CM

## 2013-01-08 DIAGNOSIS — G4700 Insomnia, unspecified: Secondary | ICD-10-CM

## 2013-01-08 DIAGNOSIS — E1165 Type 2 diabetes mellitus with hyperglycemia: Secondary | ICD-10-CM

## 2013-01-08 DIAGNOSIS — L299 Pruritus, unspecified: Secondary | ICD-10-CM | POA: Insufficient documentation

## 2013-01-08 DIAGNOSIS — I10 Essential (primary) hypertension: Secondary | ICD-10-CM

## 2013-01-08 DIAGNOSIS — M1990 Unspecified osteoarthritis, unspecified site: Secondary | ICD-10-CM

## 2013-01-08 MED ORDER — DIPHENHYDRAMINE-ZINC ACETATE 1-0.1 % EX CREA: TOPICAL_CREAM | Freq: Three times a day (TID) | CUTANEOUS | Status: DC | PRN

## 2013-01-08 MED ORDER — INSULIN DETEMIR 100 UNIT/ML FLEXPEN: 45.0000 [IU] | PEN_INJECTOR | Freq: Two times a day (BID) | SUBCUTANEOUS | Status: DC

## 2013-01-08 NOTE — Progress Notes (Signed)
Patient ID: [name], female   DOB: 12-03-1934, 78 y.o.   MRN: [id number]  Chief Complaint  Patient presents with  . Acute Visit    hyperglycemia  . Pruritis  . Fatigue   Allergies  Allergen Reactions  . Sulfonamide Derivatives Swelling    To mouth    HPI 78 y/o female patient is here with her daughter. Her blood sugar has been running high in 300-450s lately. She mentions taking her medications. Does not have her glucometer with her. She is morbidly obese, wheelchair bound. She requests for her pain medication She also has interrupted sleep at night. She feels tired during am. She has been having itching all over her body for few weeks  Review of Systems  Constitutional: Positive for malaise/fatigue. Negative for fever, chills and diaphoresis.  HENT: Negative for congestion, sore throat and tinnitus.   Eyes: Negative for blurred vision.  Respiratory: Negative for cough and shortness of breath.   Cardiovascular: Negative for chest pain, palpitations, claudication and leg swelling.  Gastrointestinal: Negative for heartburn, nausea, vomiting, abdominal pain and diarrhea.  Genitourinary: Negative for dysuria and frequency.  Musculoskeletal: Positive for joint pain. Negative for myalgias and falls.  Skin: Positive for itching. Negative for rash.  Neurological: Negative for dizziness, seizures, weakness and headaches.  Psychiatric/Behavioral: Negative for depression and memory loss. The patient is not nervous/anxious and does not have insomnia.    Past Medical History  Diagnosis Date  . COPD (chronic obstructive pulmonary disease)     patient denies this  . Diabetes mellitus   . Diverticular disease   . Hypertension   . Hyperlipidemia   . Coronary artery disease     a. s/p IMI 2004 tx with BMS to RCA;  b. s/p Promus DES to RCA 2/12 (cath: LM 20-30%, pLAD 20-30%, mLAD 50%, RI 30%, pRCA 60%, mRCA 90% - tx with PCI);  c. myoview 1/07: EF 49%, inf scar, no isch  . Urinary  incontinence   . Insomnia   . Osteoarthritis   . Morbid obesity   . Obstructive sleep apnea   . Polymyalgia rheumatica   . Anxiety   . Arthritis   . Depression   . Cholelithiasis   . CHF (congestive heart failure)   . GERD (gastroesophageal reflux disease)   . Pulmonary emboli 9/13    felt to need lifelong anticoagulation  . Debility   . Unspecified constipation   . Allergy   . Gout, unspecified   . Dysuria   . Urinary complications   . Pain, chronic   . Allergic rhinitis due to pollen   . Osteoarthrosis, unspecified whether generalized or localized, lower leg   . Type II or unspecified type diabetes mellitus without mention of complication, uncontrolled   . Anemia, unspecified   . Coronary atherosclerosis of native coronary artery   . Other malaise and fatigue    Current Outpatient Prescriptions on File Prior to Visit  Medication Sig Dispense Refill  . clonazePAM (KLONOPIN) 1 MG tablet Take one tablet in the morning, and two at bedtime for sleep  90 tablet  5  . COLCRYS 0.6 MG tablet TAKE 1 TABLET BY MOUTH EVERY DAY FOR GOUT  30 tablet  2  . docusate sodium (COLACE) 100 MG capsule Take 1 capsule (100 mg total) by mouth 3 (three) times daily as needed for constipation.  90 capsule  5  . DULoxetine (CYMBALTA) 60 MG capsule Take 1 capsule (60 mg total) by mouth daily.  [place]  capsule  5  . exenatide (BYETTA 10 MCG PEN) 10 MCG/0.04ML SOPN injection Inject 0.04 mL (10 mcg total) into the skin 2 (two) times daily with a meal.  1.2 mL  4  . furosemide (LASIX) 20 MG tablet TAKE 1 TABLET BY MOUTH TWICE A DAY FOR EDEMA  60 tablet  1  . Glucose Blood (FREESTYLE LITE TEST VI) by In Vitro route. Use to check diabetes twice a day Dx. 250.02      . Insulin Pen Needle 32G X 4 MM MISC Use with all pen devices (up to 10 needles per day)  300 each  4  . isosorbide mononitrate (IMDUR) 60 MG 24 hr tablet Take 60 mg by mouth daily. Take 1/2 tablet once a day for heart.      . Lancets (FREESTYLE)  lancets 1 each by Other route as needed for other. Use to check diabetes twice a day Dx. 250.02      . metoprolol succinate (TOPROL-XL) 25 MG 24 hr tablet TAKE 1 TABLET BY MOUTH ONCE EVERY DAY FOR BLOOD PRESSURE  30 tablet  1  . nitroGLYCERIN (NITROSTAT) 0.4 MG SL tablet Place 0.4 mg under the tongue every 5 (five) minutes as needed. For chest pain      . Oxycodone HCl 10 MG TABS       . oxyCODONE-acetaminophen (PERCOCET) 10-325 MG per tablet Take one tablet every 6 hours as needed for pain  120 tablet  0  . pantoprazole (PROTONIX) 40 MG tablet TAKE 1 TABLET BY MOUTH EVERY DAY  30 tablet  3  . polyethylene glycol (MIRALAX / GLYCOLAX) packet Take 17 g by mouth daily. Mix 17 grams in a 8 oz glass of water or juice as needed for constipation      . pravastatin (PRAVACHOL) 20 MG tablet TAKE 1 TABLET BY MOUTH ONCE EVERY DAY FOR CHOLESTEROL  30 tablet  5  . senna (SENOKOT) 8.6 MG TABS Take 1 tablet by mouth. Take two tablets a day      . warfarin (COUMADIN) 5 MG tablet Take as directed by Anticoagulation clinic  45 tablet  1   No current facility-administered medications on file prior to visit.    BP 142/80  Pulse 81  Temp(Src) 98.2 F (36.8 C) (Oral)  Resp 18  Ht 5\' 7"  (1.702 m)  Wt 343 lb 12.8 oz (155.947 kg)  BMI 53.83 kg/m2  SpO2 95%  gen- adult morbidly obese female in wheelchair heent- no pallor, no icterus, no LAD, mmm cvs- n s1, s2, rrr respi- CTAB abdo- bs+, soft, non tender Ext- limited mobility with all 4, trace edema Neuro- aaox 3, no focal deficit Psych- mood and affect normal  Labs- a1c 11.1 in 1/14  CMP     Component Value Date/Time   NA 138 07/31/2012 0905   NA 137 05/13/2012   K 4.7 07/31/2012 0905   CL 100 07/31/2012 0905   CO2 29 07/31/2012 0905   GLUCOSE 209* 07/31/2012 0905   BUN 23 07/31/2012 0905   BUN 28* 05/13/2012   CREATININE 1.3* 07/31/2012 0905   CREATININE 1.4* 05/13/2012   CREATININE 1.33* 10/21/2010 1519   CALCIUM 8.9 07/31/2012 0905   PROT 7.5 01/25/2012  1921   ALBUMIN 3.2* 01/25/2012 1921   AST 12* 05/13/2012   ALT 18 05/13/2012   ALKPHOS 86 05/13/2012   BILITOT 0.3 01/25/2012 1921   GFRNONAA 35* 02/01/2012 0545   GFRAA 41* 02/01/2012 0545   Assessment/plan  Uncontrolled dm type  2- morbidly obese. Will increase her levemir to 45 u bid and continue byetta for now. Check a1c. Monitor cbgbid at home and bring glucometer to office next visit. Pt refuses nutrition consult. She mentions she is eating fine and complaint with her medication. cotninue asa and statin. bp is controlled with current regimen  Pruritis- likely from elevated blood sugar. To keep sugar level under control is the goal. Will have her on benadryl cream prn for now  Osteoarthritis- called pharmacy and verified on pain medication picked up on 12/31/12. Explained to pt and mentions she thinks her daughter must have picked it up. No refills provided  Fatigue and interrupted sleep- with her morbid obesity, concerns for obesity hypoventilation syndrome. Will provide sleep study referral for sleep study to rule this out and provide biPAP if needed  Influenza vaccine provided  Went over medication compliance, importance of sugar control with the patient. Answered questions from patient and her daughter

## 2013-01-09 LAB — CBC WITH DIFFERENTIAL/PLATELET
Basophils Absolute: 0 10*3/uL (ref 0.0–0.2)
Basos: 0 % (ref 0–3)
Eos: 2 % (ref 0–5)
Eosinophils Absolute: 0.2 10*3/uL (ref 0.0–0.4)
HCT: 34.6 % (ref 34.0–46.6)
Hemoglobin: 11.2 g/dL (ref 11.1–15.9)
Immature Grans (Abs): 0 10*3/uL (ref 0.0–0.1)
Immature Granulocytes: 0 % (ref 0–2)
Lymphocytes Absolute: 3.5 10*3/uL — ABNORMAL HIGH (ref 0.7–3.1)
Lymphs: 39 % (ref 14–46)
MCH: 27.9 pg (ref 26.6–33.0)
MCHC: 32.4 g/dL (ref 31.5–35.7)
MCV: 86 fL (ref 79–97)
Monocytes Absolute: 0.7 10*3/uL (ref 0.1–0.9)
Monocytes: 8 % (ref 4–12)
Neutrophils Absolute: 4.6 10*3/uL (ref 1.4–7.0)
Neutrophils Relative %: 51 % (ref 40–74)
RBC: 4.01 x10E6/uL (ref 3.77–5.28)
RDW: 14 % (ref 12.3–15.4)
WBC: 9 10*3/uL (ref 3.4–10.8)

## 2013-01-09 LAB — COMPREHENSIVE METABOLIC PANEL
ALT: 15 IU/L (ref 0–32)
AST: 13 IU/L (ref 0–40)
Albumin/Globulin Ratio: 1.1 (ref 1.1–2.5)
Albumin: 3.6 g/dL (ref 3.5–4.8)
Alkaline Phosphatase: 97 IU/L (ref 39–117)
BUN/Creatinine Ratio: 15 (ref 11–26)
BUN: 17 mg/dL (ref 8–27)
CO2: 27 mmol/L (ref 18–29)
Calcium: 9.3 mg/dL (ref 8.6–10.2)
Chloride: 91 mmol/L — ABNORMAL LOW (ref 97–108)
Creatinine, Ser: 1.11 mg/dL — ABNORMAL HIGH (ref 0.57–1.00)
GFR calc Af Amer: 56 mL/min/{1.73_m2} — ABNORMAL LOW (ref >59)
GFR calc non Af Amer: 48 mL/min/{1.73_m2} — ABNORMAL LOW (ref >59)
Globulin, Total: 3.2 g/dL (ref 1.5–4.5)
Glucose: 380 mg/dL — ABNORMAL HIGH (ref 65–99)
Potassium: 4.5 mmol/L (ref 3.5–5.2)
Sodium: 133 mmol/L — ABNORMAL LOW (ref 134–144)
Total Bilirubin: 0.2 mg/dL (ref 0.0–1.2)
Total Protein: 6.8 g/dL (ref 6.0–8.5)

## 2013-01-09 LAB — HEMOGLOBIN A1C
Est. average glucose Bld gHb Est-mCnc: 306 mg/dL
Hgb A1c MFr Bld: 12.3 % — ABNORMAL HIGH (ref 4.8–5.6)

## 2013-01-09 LAB — LIPID PANEL
Chol/HDL Ratio: 2.8 ratio units (ref 0.0–4.4)
Cholesterol, Total: 119 mg/dL (ref 100–199)
HDL: 42 mg/dL (ref >39)
LDL Calculated: 43 mg/dL (ref 0–99)
Triglycerides: 170 mg/dL — ABNORMAL HIGH (ref 0–149)
VLDL Cholesterol Cal: 34 mg/dL (ref 5–40)

## 2013-01-10 ENCOUNTER — Other Ambulatory Visit: Payer: Self-pay | Admitting: Internal Medicine

## 2013-01-10 DIAGNOSIS — E119 Type 2 diabetes mellitus without complications: Secondary | ICD-10-CM

## 2013-01-14 ENCOUNTER — Encounter: Payer: Self-pay | Admitting: *Deleted

## 2013-01-14 ENCOUNTER — Encounter: Payer: Medicare Other | Attending: Internal Medicine | Admitting: *Deleted

## 2013-01-14 ENCOUNTER — Other Ambulatory Visit: Payer: Self-pay | Admitting: Internal Medicine

## 2013-01-14 VITALS — Ht 67.0 in | Wt 336.0 lb

## 2013-01-14 DIAGNOSIS — Z713 Dietary counseling and surveillance: Secondary | ICD-10-CM | POA: Insufficient documentation

## 2013-01-14 DIAGNOSIS — E1165 Type 2 diabetes mellitus with hyperglycemia: Secondary | ICD-10-CM | POA: Insufficient documentation

## 2013-01-14 DIAGNOSIS — IMO0002 Reserved for concepts with insufficient information to code with codable children: Secondary | ICD-10-CM | POA: Insufficient documentation

## 2013-01-14 DIAGNOSIS — E1122 Type 2 diabetes mellitus with diabetic chronic kidney disease: Secondary | ICD-10-CM

## 2013-01-14 NOTE — Progress Notes (Signed)
Appt start time: 1130 end time:  1300.   Assessment:  Patient was seen on  01/14/13 for individual diabetes education. Mrs. Longtin comes for visit because her glucose is elevated and is unable to get under control. Patient presents in a wheel chair with her daughter. They live together. Did not test glucose this morning. FBS yesterday 329. The patient identifies that she has difficulty with her memory. She notes that she does not remember some times if she has taken her medication. A plan has been developed for her daughter [name] to assist with the medication administration schedule. Both are in agreement.Patient stays her in room 100% of the day. She utilizes a bedside commode rather going into the bathroom.  Current HbA1c: 12.3%  MEDICATIONS: See List: Lantus, Byetta, Humalog   DIETARY INTAKE:  Avoids fried foods and white food. Usual eating pattern includes 2 meals and snacks at will daily.  24-hr recall:  Brunch ( AM): Oatmeal or raisin bran cereal, bacon, eggs and grits. @ 11:00am Snk ( AM): fruit or fiber one bar, Velvita crackers (cookies)  L ( PM): none Snk ( PM): none D ( PM): bakes fish, turnip greens, yams, brown rice, or spaghetti, salad, baked pork chop @ 1900 Snk ( PM): fruit,velvita crackers, protein bar, popcorn Beverages: cranberry juice, water  Usual physical activity: none, stays in bed 100% of the day    Nutritional Diagnosis:  NB-1.1 Food and nutrition-related knowledge deficit As related to elevated glucose.  As evidenced by A1c of 12.3%.    Intervention:  Nutrition counseling provided.  Discussed diabetes disease process and treatment options.  Discussed physiology of diabetes and role of obesity on insulin resistance.  Encouraged moderate weight reduction to improve glucose levels.  Discussed role of medications and diet in glucose control  Provided education on macronutrients on glucose levels.  Provided education on carb counting, importance of regularly  scheduled meals/snacks, and meal planning  Discussed effects of physical activity on glucose levels and long-term glucose control.  Recommended 150 minutes of physical activity/week.  Reviewed patient medications.  Discussed role of medication on blood glucose and possible side effects  Discussed blood glucose monitoring and interpretation.  Discussed recommended target ranges and individual ranges.    Described short-term complications: hyper- and hypo-glycemia.  Discussed causes,symptoms, and treatment options.  Discussed prevention, detection, and treatment of long-term complications.  Discussed the role of prolonged elevated glucose levels on body systems.  Discussed role of stress on blood glucose levels and discussed strategies to manage psychosocial issues.  Discussed recommendations for long-term diabetes self-care.  Established checklist for medical, dental, and emotional self-care.  Plan:  Eat Small frequent meals daily Aim for 4 Carb Choices per meal (60 grams) +/- 1 either way  Aim for 0-2 Carbs per snack if hungry  Consider reading food labels for Total Carbohydrate and Fat Grams of foods Consider  increasing your activity level by walking to the bathroom once daily, getting up in wheel chair or recliner daily. Consider checking BG twice daily at alternate times per day to include FBS and 2hpp as directed by MD   Taking medication as directed by MD with assistance of daughter Byetta 15 minutes before brunch @ 11:00am and dinner @ 7:00pm Lantus 65u at 11:00pm Humalog 10u with breakfast @ 11:00am and Dinner @ 7:00 pm Make a spread sheet to mark off when medications are taken.  Handouts given during visit include: Living Well with Diabetes Carb Counting  handouts Meal Plan Card Meal planning  work sheet  Barriers to learning/adherance to lifestyle change:   Diabetes self-care support plan:   [facility] support group  Lives with Family  Monitoring/Evaluation:  Dietary  intake, exercise, glucose testing, and body weight return  in 8 week(s).

## 2013-01-14 NOTE — Patient Instructions (Addendum)
Plan:  Aim for 4 Carb Choices per meal (60 grams) +/- 1 either way  Aim for 0-2 Carbs per snack if hungry  Consider reading food labels for Total Carbohydrate and Fat Grams of foods Consider  increasing your activity level by walking to the bathroom once daily, getting up in wheel chair or recliner daily. Consider checking BG twice daily at alternate times per day to include FBS and 2hpp as directed by MD   Taking medication as directed by MD with assistance of daughter Byetta 15 minutes before brunch @ 11:00am and dinner @ 7:00pm Lantus 65u at 11:00pm Humalog 10u with breakfast @ 11:00am and Dinner @ 7:00 pm Make a spread sheet to mark off when medications are taken

## 2013-01-15 ENCOUNTER — Ambulatory Visit
Admission: RE | Admit: 2013-01-15 | Discharge: 2013-01-15 | Disposition: A | Discharge status: Home / Self Care | Payer: Medicare Other | Source: Ambulatory Visit | Attending: Internal Medicine | Admitting: Internal Medicine

## 2013-01-16 ENCOUNTER — Other Ambulatory Visit: Payer: Self-pay | Admitting: *Deleted

## 2013-01-16 MED ORDER — GLUCOSE BLOOD VI STRP: ORAL_STRIP | Status: DC

## 2013-01-17 ENCOUNTER — Ambulatory Visit (INDEPENDENT_AMBULATORY_CARE_PROVIDER_SITE_OTHER): Payer: Medicare Other | Admitting: *Deleted

## 2013-01-17 DIAGNOSIS — I2699 Other pulmonary embolism without acute cor pulmonale: Secondary | ICD-10-CM

## 2013-01-17 DIAGNOSIS — Z7901 Long term (current) use of anticoagulants: Secondary | ICD-10-CM

## 2013-01-17 LAB — POCT INR: INR: 1.8

## 2013-01-19 ENCOUNTER — Other Ambulatory Visit: Payer: Self-pay | Admitting: Internal Medicine

## 2013-01-19 ENCOUNTER — Emergency Department (HOSPITAL_COMMUNITY)
Admission: EM | Admit: 2013-01-19 | Discharge: 2013-01-19 | Disposition: A | Discharge status: Home / Self Care | Payer: Medicare Other | Attending: Emergency Medicine | Admitting: Emergency Medicine

## 2013-01-19 ENCOUNTER — Encounter (HOSPITAL_COMMUNITY): Payer: Self-pay | Admitting: Nurse Practitioner

## 2013-01-19 DIAGNOSIS — R739 Hyperglycemia, unspecified: Secondary | ICD-10-CM

## 2013-01-19 DIAGNOSIS — Z86711 Personal history of pulmonary embolism: Secondary | ICD-10-CM | POA: Insufficient documentation

## 2013-01-19 DIAGNOSIS — Z794 Long term (current) use of insulin: Secondary | ICD-10-CM | POA: Insufficient documentation

## 2013-01-19 DIAGNOSIS — R51 Headache: Secondary | ICD-10-CM | POA: Insufficient documentation

## 2013-01-19 DIAGNOSIS — Z79899 Other long term (current) drug therapy: Secondary | ICD-10-CM | POA: Insufficient documentation

## 2013-01-19 DIAGNOSIS — I509 Heart failure, unspecified: Secondary | ICD-10-CM | POA: Insufficient documentation

## 2013-01-19 DIAGNOSIS — F329 Major depressive disorder, single episode, unspecified: Secondary | ICD-10-CM | POA: Insufficient documentation

## 2013-01-19 DIAGNOSIS — J449 Chronic obstructive pulmonary disease, unspecified: Secondary | ICD-10-CM | POA: Insufficient documentation

## 2013-01-19 DIAGNOSIS — Z8669 Personal history of other diseases of the nervous system and sense organs: Secondary | ICD-10-CM | POA: Insufficient documentation

## 2013-01-19 DIAGNOSIS — J4489 Other specified chronic obstructive pulmonary disease: Secondary | ICD-10-CM | POA: Insufficient documentation

## 2013-01-19 DIAGNOSIS — Z951 Presence of aortocoronary bypass graft: Secondary | ICD-10-CM | POA: Insufficient documentation

## 2013-01-19 DIAGNOSIS — K219 Gastro-esophageal reflux disease without esophagitis: Secondary | ICD-10-CM | POA: Insufficient documentation

## 2013-01-19 DIAGNOSIS — N390 Urinary tract infection, site not specified: Secondary | ICD-10-CM | POA: Insufficient documentation

## 2013-01-19 DIAGNOSIS — F3289 Other specified depressive episodes: Secondary | ICD-10-CM | POA: Insufficient documentation

## 2013-01-19 DIAGNOSIS — K5900 Constipation, unspecified: Secondary | ICD-10-CM | POA: Insufficient documentation

## 2013-01-19 DIAGNOSIS — E785 Hyperlipidemia, unspecified: Secondary | ICD-10-CM | POA: Insufficient documentation

## 2013-01-19 DIAGNOSIS — Z7901 Long term (current) use of anticoagulants: Secondary | ICD-10-CM | POA: Insufficient documentation

## 2013-01-19 DIAGNOSIS — E119 Type 2 diabetes mellitus without complications: Secondary | ICD-10-CM | POA: Insufficient documentation

## 2013-01-19 DIAGNOSIS — R635 Abnormal weight gain: Secondary | ICD-10-CM | POA: Insufficient documentation

## 2013-01-19 DIAGNOSIS — Z8739 Personal history of other diseases of the musculoskeletal system and connective tissue: Secondary | ICD-10-CM | POA: Insufficient documentation

## 2013-01-19 DIAGNOSIS — G8929 Other chronic pain: Secondary | ICD-10-CM | POA: Insufficient documentation

## 2013-01-19 DIAGNOSIS — I10 Essential (primary) hypertension: Secondary | ICD-10-CM | POA: Insufficient documentation

## 2013-01-19 DIAGNOSIS — I2510 Atherosclerotic heart disease of native coronary artery without angina pectoris: Secondary | ICD-10-CM | POA: Insufficient documentation

## 2013-01-19 DIAGNOSIS — Z862 Personal history of diseases of the blood and blood-forming organs and certain disorders involving the immune mechanism: Secondary | ICD-10-CM | POA: Insufficient documentation

## 2013-01-19 DIAGNOSIS — F411 Generalized anxiety disorder: Secondary | ICD-10-CM | POA: Insufficient documentation

## 2013-01-19 LAB — PRO B NATRIURETIC PEPTIDE: Pro B Natriuretic peptide (BNP): 67.4 pg/mL (ref 0–450)

## 2013-01-19 LAB — POCT I-STAT, CHEM 8
BUN: 20 mg/dL (ref 6–23)
Calcium, Ion: 1.14 mmol/L (ref 1.13–1.30)
Chloride: 96 mEq/L (ref 96–112)
Creatinine, Ser: 1.4 mg/dL — ABNORMAL HIGH (ref 0.50–1.10)
Glucose, Bld: 418 mg/dL — ABNORMAL HIGH (ref 70–99)
HCT: 39 % (ref 36.0–46.0)
Hemoglobin: 13.3 g/dL (ref 12.0–15.0)
Potassium: 4.7 mEq/L (ref 3.5–5.1)
Sodium: 134 mEq/L — ABNORMAL LOW (ref 135–145)
TCO2: 25 mmol/L (ref 0–100)

## 2013-01-19 LAB — CBC
HCT: 34.8 % — ABNORMAL LOW (ref 36.0–46.0)
Hemoglobin: 11.1 g/dL — ABNORMAL LOW (ref 12.0–15.0)
MCH: 28 pg (ref 26.0–34.0)
MCHC: 31.9 g/dL (ref 30.0–36.0)
MCV: 87.9 fL (ref 78.0–100.0)
Platelets: 269 10*3/uL (ref 150–400)
RBC: 3.96 MIL/uL (ref 3.87–5.11)
RDW: 14.3 % (ref 11.5–15.5)
WBC: 8.3 10*3/uL (ref 4.0–10.5)

## 2013-01-19 LAB — URINALYSIS, ROUTINE W REFLEX MICROSCOPIC
Bilirubin Urine: NEGATIVE
Glucose, UA: >1000 mg/dL — AB
Hgb urine dipstick: NEGATIVE
Ketones, ur: NEGATIVE mg/dL
Nitrite: POSITIVE — AB
Protein, ur: NEGATIVE mg/dL
Specific Gravity, Urine: 1.017 (ref 1.005–1.030)
Urobilinogen, UA: 0.2 mg/dL (ref 0.0–1.0)
pH: 5 (ref 5.0–8.0)

## 2013-01-19 LAB — URINE MICROSCOPIC-ADD ON

## 2013-01-19 LAB — CG4 I-STAT (LACTIC ACID): Lactic Acid, Venous: 3.02 mmol/L — ABNORMAL HIGH (ref 0.5–2.2)

## 2013-01-19 LAB — GLUCOSE, CAPILLARY: Glucose-Capillary: 400 mg/dL — ABNORMAL HIGH (ref 70–99)

## 2013-01-19 MED ORDER — CEPHALEXIN 250 MG PO CAPS
500.0000 mg | ORAL_CAPSULE | Freq: Once | ORAL | Status: AC
  Administered 2013-01-19: 500 mg via ORAL
  Filled 2013-01-19: qty 2

## 2013-01-19 MED ORDER — SODIUM CHLORIDE 0.9 % IV BOLUS (SEPSIS)
1000.0000 mL | Freq: Once | INTRAVENOUS | Status: AC
  Administered 2013-01-19: 1000 mL via INTRAVENOUS

## 2013-01-19 MED ORDER — CEPHALEXIN 500 MG PO CAPS: 500.0000 mg | ORAL_CAPSULE | Freq: Two times a day (BID) | ORAL | Status: AC

## 2013-01-19 MED ORDER — FOSFOMYCIN TROMETHAMINE 3 G PO PACK
3.0000 g | PACK | Freq: Once | ORAL | Status: AC
  Administered 2013-01-19: 3 g via ORAL
  Filled 2013-01-19: qty 3

## 2013-01-19 NOTE — ED Notes (Signed)
Pt reports she has "felt bad" for the past 3 months. States she is being followed for same by PCP and they told her she had kidney disease. Pt reports she is tired of feeling bad. States she wakes up with a headache every day, her pelvis has been hurting, her blood sugar has been >300 for 3 months, and she "just feels weak." A&Ox4, resp e/u

## 2013-01-19 NOTE — ED Notes (Signed)
Patient will be discharged after her IV antibiotics are administered. Waiting for main pharmacy to send.

## 2013-01-19 NOTE — ED Provider Notes (Signed)
CSN: [id number]     Arrival date & time 01/19/13  1429 History   First MD Initiated Contact with Patient 01/19/13 1538     Chief Complaint  Patient presents with  . Weakness   (Consider location/radiation/quality/duration/timing/severity/associated sxs/prior Treatment) HPI Patient presents with concern of increasing fatigue, weight gain, headache. Patient's symptoms began approximately one month ago.  About that time she switched insulin formulations.  Subsequently she developed the aforementioned complaints.  Initially there was increasing fatigue and weight gain, including 30 pounds over 30 days.  He went over the past week she has developed anterior head pain.  It is throbbing, sore, not relieved with OTC medication.  The headache is worse in the morning, improved over the course of the day. There is no concurrent confusion, disorientation, unilateral weakness, nausea, vomiting, visual loss. Patient has subsequently switched back to her original insulin dosing. Patient has additional concern over possible renal dysfunction, identified as an outpatient. Past Medical History  Diagnosis Date  . COPD (chronic obstructive pulmonary disease)     patient denies this  . Diabetes mellitus   . Diverticular disease   . Hypertension   . Hyperlipidemia   . Coronary artery disease     a. s/p IMI 2004 tx with BMS to RCA;  b. s/p Promus DES to RCA 2/12 (cath: LM 20-30%, pLAD 20-30%, mLAD 50%, RI 30%, pRCA 60%, mRCA 90% - tx with PCI);  c. myoview 1/07: EF 49%, inf scar, no isch  . Urinary incontinence   . Insomnia   . Osteoarthritis   . Morbid obesity   . Obstructive sleep apnea   . Polymyalgia rheumatica   . Anxiety   . Arthritis   . Depression   . Cholelithiasis   . CHF (congestive heart failure)   . GERD (gastroesophageal reflux disease)   . Pulmonary emboli 9/13    felt to need lifelong anticoagulation  . Debility   . Unspecified constipation   . Allergy   . Gout, unspecified   .  Dysuria   . Urinary complications   . Pain, chronic   . Allergic rhinitis due to pollen   . Osteoarthrosis, unspecified whether generalized or localized, lower leg   . Type II or unspecified type diabetes mellitus without mention of complication, uncontrolled   . Anemia, unspecified   . Coronary atherosclerosis of native coronary artery   . Other malaise and fatigue    Past Surgical History  Procedure Laterality Date  . Coronary artery bypass graft    . Appendectomy    . Cardiac catheterization    . Gastric bypass  1977    reversed in 1979  . Blood clots/legs and lungs  2013  . Mi with stent placement  2004   Family History  Problem Relation Age of Onset  . Breast cancer Mother   . Heart disease Mother   . Throat cancer Father   . Hypertension Father   . Arthritis Father   . Diabetes Father   . Arthritis Sister   . Obesity Sister   . Diabetes Sister    History  Substance Use Topics  . Smoking status: Never Smoker   . Smokeless tobacco: Not on file  . Alcohol Use: No   OB History   Grav Para Term Preterm Abortions TAB SAB Ect Mult Living                 Review of Systems  Constitutional:       Per HPI, otherwise  negative  HENT:       Per HPI, otherwise negative  Respiratory:       Per HPI, otherwise negative  Cardiovascular:       Per HPI, otherwise negative  Gastrointestinal: Negative for vomiting.  Endocrine:       Negative aside from HPI  Genitourinary:       Neg aside from HPI   Musculoskeletal:       Per HPI, otherwise negative  Skin: Negative.   Neurological: Negative for syncope.    Allergies  Sulfonamide derivatives  Home Medications   Current Outpatient Rx  Name  Route  Sig  Dispense  Refill  . clonazePAM (KLONOPIN) 1 MG tablet      Take one tablet in the morning, and two at bedtime for sleep   90 tablet   5   . COLCRYS 0.6 MG tablet      TAKE 1 TABLET BY MOUTH EVERY DAY FOR GOUT   30 tablet   2   . diphenhydrAMINE-zinc acetate  (BENADRYL) cream   Topical   Apply topically 3 (three) times daily as needed for itching.   28.3 g   0   . docusate sodium (COLACE) 100 MG capsule   Oral   Take 1 capsule (100 mg total) by mouth 3 (three) times daily as needed for constipation.   90 capsule   5   . DULoxetine (CYMBALTA) 60 MG capsule   Oral   Take 1 capsule (60 mg total) by mouth daily.   30 capsule   5   . exenatide (BYETTA 10 MCG PEN) 10 MCG/0.04ML SOPN injection   Subcutaneous   Inject 0.04 mL (10 mcg total) into the skin 2 (two) times daily with a meal.   1.2 mL   4   . furosemide (LASIX) 20 MG tablet      TAKE 1 TABLET BY MOUTH TWICE A DAY FOR EDEMA   60 tablet   1   . glucose blood (FREESTYLE LITE) test strip      Check blood sugar twice daily. DX: 250.00   100 each   12   . Insulin Pen Needle 32G X 4 MM MISC      Use with all pen devices (up to 10 needles per day)   300 each   4   . isosorbide mononitrate (IMDUR) 60 MG 24 hr tablet   Oral   Take 60 mg by mouth daily.          . Lancets (FREESTYLE) lancets   Other   1 each by Other route as needed for other. Use to check diabetes twice a day Dx. 250.02         . LANTUS SOLOSTAR 100 UNIT/ML SOPN      INJECT 60 UNITS ONCE DAILY   5 pen   6   . metoprolol succinate (TOPROL-XL) 25 MG 24 hr tablet      TAKE 1 TABLET BY MOUTH ONCE EVERY DAY FOR BLOOD PRESSURE   30 tablet   1   . oxyCODONE-acetaminophen (PERCOCET) 10-325 MG per tablet      Take one tablet every 6 hours as needed for pain   120 tablet   0   . pantoprazole (PROTONIX) 40 MG tablet      TAKE 1 TABLET BY MOUTH EVERY DAY   30 tablet   3   . polyethylene glycol (MIRALAX / GLYCOLAX) packet   Oral   Take 17 g by mouth  daily. Mix 17 grams in a 8 oz glass of water or juice as needed for constipation         . pravastatin (PRAVACHOL) 20 MG tablet      TAKE 1 TABLET BY MOUTH ONCE EVERY DAY FOR CHOLESTEROL   30 tablet   5   . senna (SENOKOT) 8.6 MG TABS    Oral   Take 1 tablet by mouth. Take two tablets a day         . warfarin (COUMADIN) 5 MG tablet   Oral   Take 5-7.5 mg by mouth daily. Take as directed by Anticoagulation clinic. Pt to take 7.5 mg every day except on Thursday pt takes 5 mg.         . nitroGLYCERIN (NITROSTAT) 0.4 MG SL tablet   Sublingual   Place 0.4 mg under the tongue every 5 (five) minutes as needed. For chest pain          BP 121/56  Pulse 74  Temp(Src) 98.3 F (36.8 C) (Oral)  Resp 14  Ht 5\' 7"  (1.702 m)  Wt 343 lb (155.584 kg)  BMI 53.71 kg/m2  SpO2 99% Physical Exam  Nursing note and vitals reviewed. Constitutional: She is oriented to person, place, and time. She appears well-developed and well-nourished. No distress.  Morbidly obese elderly female  HENT:  Head: Normocephalic and atraumatic.  Eyes: Conjunctivae and EOM are normal.  Cardiovascular: Normal rate and regular rhythm.   Pulmonary/Chest: Effort normal. No stridor. She has decreased breath sounds.  Abdominal: She exhibits no distension.  Musculoskeletal: She exhibits no edema.  Neurological: She is alert and oriented to person, place, and time. She displays no atrophy and no tremor. No cranial nerve deficit or sensory deficit. She exhibits normal muscle tone. She displays no seizure activity.  Skin: Skin is warm and dry.  Psychiatric: She has a normal mood and affect.    ED Course  Procedures (including critical care time) Labs Review Labs Reviewed  GLUCOSE, CAPILLARY - Abnormal; Notable for the following:    Glucose-Capillary 400 (*)    All other components within normal limits  POCT I-STAT, CHEM 8 - Abnormal; Notable for the following:    Sodium 134 (*)    Creatinine, Ser 1.40 (*)    Glucose, Bld 418 (*)    All other components within normal limits  BLOOD GAS, VENOUS  CBC  URINALYSIS, ROUTINE W REFLEX MICROSCOPIC  PRO B NATRIURETIC PEPTIDE   Imaging Review No results found. Initial blood glucose check is greater than  400. Cardiac monitor has sinus rhythm, rate 80, unremarkable EKG has rate 79 sinus rhythm, nonspecific QRS changes, unremarkable Pulse oximetry 95% borderline   7:12 PM Patient and family members made aware of all results.  We had a lengthy discussion on the need for primary care followup, the initiation of antibiotics for her urinary tract infection.  MDM  No diagnosis found. Patient presents with multiple complaints.  On exam she is awake and alert, neurologically intact, afebrile.  With patient's multiple medical problems, broad differentials considered.  Patient remained in no distress with stable vital signs throughout her emergency department stay.  Labs demonstrate evidence of urinary tract infection. She started on antibiotics, discharged in stable condition with primary care followup.    Carmin Muskrat, MD 01/19/13 [phone]

## 2013-01-19 NOTE — ED Notes (Signed)
Patient given drink and snacks.

## 2013-01-21 ENCOUNTER — Other Ambulatory Visit: Payer: Self-pay | Admitting: Internal Medicine

## 2013-01-21 DIAGNOSIS — R928 Other abnormal and inconclusive findings on diagnostic imaging of breast: Secondary | ICD-10-CM

## 2013-01-21 LAB — URINE CULTURE: Colony Count: >=100000

## 2013-01-22 NOTE — ED Notes (Signed)
Post ED Visit - Positive Culture Follow-up: Successful Patient Follow-Up  Culture assessed and recommendations reviewed by: []  Wes La [place]erta, Pharm.D., BCPS []  Heide Guile, Pharm.D., BCPS [x]  Alycia Rossetti, Pharm.D., BCPS []  Bland, Pharm.D., BCPS, AAHIVP []  Legrand Como, Pharm.D., BCPS, AAHIVP  Positive Cephalexin culture  []  Patient discharged without antimicrobial prescription and treatment is now indicated [x]  Organism is resistant to prescribed ED discharge antimicrobial []  Patient with positive blood cultures  Changes discussed with ED provider: Margarita Mail New antibiotic prescription :Cefpodoxime 100 mg BID x 7days  Varney Baas 01/22/2013, 5:13 PM

## 2013-01-22 NOTE — Progress Notes (Signed)
ED Antimicrobial Stewardship Positive Culture Follow Up   [name] is an 78 y.o. female who presented to [facility] on 01/19/2013 with a chief complaint of fatigue and weakness  Chief Complaint  Patient presents with  . Weakness    Recent Results (from the past 720 hour(s))  URINE CULTURE     Status: None   Collection Time    01/19/13  4:51 PM      Result Value Range Status   Specimen Description URINE, RANDOM   Final   Special Requests NONE   Final   Culture  Setup Time     Final   Value: 01/20/2013 01:34     Performed at [place]     Final   Value: >=100,000 COLONIES/ML     Performed at [facility]   Culture     Final   Value: ENTEROBACTER AEROGENES     Performed at [facility]   Report Status 01/21/2013 FINAL   Final   Organism ID, Bacteria ENTEROBACTER AEROGENES   Final    [x]  Treated with Keflex, organism resistant to prescribed antimicrobial  New antibiotic prescription: Cefpodoxime 100 mg bid x 7 days  ED Provider: Margarita Mail, PA-C  [name] 01/22/2013, 10:18 AM Infectious Diseases Pharmacist Phone# [phone]

## 2013-01-25 ENCOUNTER — Telehealth (HOSPITAL_COMMUNITY): Payer: Self-pay | Admitting: *Deleted

## 2013-01-26 ENCOUNTER — Telehealth (HOSPITAL_COMMUNITY): Payer: Self-pay | Admitting: Emergency Medicine

## 2013-01-29 NOTE — ED Notes (Signed)
Unable to contact via phone letter sent to [facility] address. 

## 2013-01-31 ENCOUNTER — Telehealth: Payer: Self-pay | Admitting: *Deleted

## 2013-01-31 NOTE — Telephone Encounter (Signed)
Patient called and spoke with Caren Griffins. Stated that she has a yeast infection and wanted to know if she could get in for an appointment. We close at 12 and she called at 11:45 so we had nothing available. Patient has an appointment on Monday with [name] and will ask [name] at that time. Told patient to eat yogurt and if anything changes to go to urgent care. Agreed.

## 2013-02-03 ENCOUNTER — Other Ambulatory Visit (HOSPITAL_COMMUNITY): Payer: Self-pay | Admitting: Internal Medicine

## 2013-02-03 ENCOUNTER — Ambulatory Visit (INDEPENDENT_AMBULATORY_CARE_PROVIDER_SITE_OTHER): Payer: Medicare Other | Admitting: Pharmacotherapy

## 2013-02-03 ENCOUNTER — Encounter: Payer: Self-pay | Admitting: Pharmacotherapy

## 2013-02-03 VITALS — BP 134/86 | HR 82 | Resp 14

## 2013-02-03 DIAGNOSIS — I10 Essential (primary) hypertension: Secondary | ICD-10-CM

## 2013-02-03 DIAGNOSIS — E1165 Type 2 diabetes mellitus with hyperglycemia: Secondary | ICD-10-CM

## 2013-02-03 DIAGNOSIS — E1129 Type 2 diabetes mellitus with other diabetic kidney complication: Secondary | ICD-10-CM

## 2013-02-03 MED ORDER — INSULIN LISPRO 100 UNIT/ML (KWIKPEN): 10.0000 [IU] | PEN_INJECTOR | Freq: Two times a day (BID) | SUBCUTANEOUS | Status: DC

## 2013-02-03 MED ORDER — INSULIN GLARGINE 100 UNIT/ML SOLOSTAR PEN: 70.0000 [IU] | PEN_INJECTOR | Freq: Every day | SUBCUTANEOUS | Status: DC

## 2013-02-03 NOTE — Patient Instructions (Signed)
1.  Increase Lantus to 70 units daily (split into 2 injections for absorption). 2.  Start Humalog 10 units with breakfast and supper. 3.  Stop Byetta. 4.  Start your exercise.

## 2013-02-03 NOTE — Progress Notes (Signed)
Subjective:    [name] is a 78 y.o. African American female who presents for follow-up of Type 2 diabetes mellitus.   Last OV restarted Byetta. She quit taking her Levemir.  Said it made her BG go to 400.  She restarted her Lantus.  She is taking 65 units at night.  She continues to take her Byetta.  Average BG:  124m/dl  Highest in the PM. No hypoglycemia.  Went to Urgent Care this weekend for yeast infection.  They gave her Flagyl. She went to the ER on 01/12/13 with the same problem.  She is "most of the time good" with her eating habits.  No weight loss.   No exercise. Has peripheral edema.  Wants diabetic shoes. Denies problems with vision.  She complains that when she wakes up in the morning - her throat is full of mucous.  She says everything "chokes her" when eating.  Uses ginger ale to make her burp.  She also has a lot of abdominal gas.   Review of Systems A comprehensive review of systems was negative except for: Cardiovascular: positive for lower extremity edema Gastrointestinal: positive for change in bowel habits, dyspepsia and reflux symptoms Genitourinary: positive for vaginal discharge and nocturia    Objective:    BP 134/86  Pulse 82  Resp 14  General:  alert, cooperative, no distress and morbidly obese  Oropharynx: normal findings: lips normal without lesions and gums healthy   Eyes:  negative findings: lids and lashes normal and conjunctivae and sclerae normal   Ears:  external ears normal        Lung: clear to auscultation bilaterally  Heart:  regular rate and rhythm     Extremities: edema bilateral lower extremities  Skin: dry     Neuro: mental status, speech normal, alert and oriented x3   Lab Review Glucose (mg/dL)  Date Value  01/08/2013 380*     Glucose, Bld (mg/dL)  Date Value  01/19/2013 418*  07/31/2012 209*  02/01/2012 167*     CO2 (mmol/L)  Date Value  01/08/2013 27   07/31/2012 29   02/01/2012 26      BUN (mg/dL)  Date  Value  01/19/2013 20   01/08/2013 17   07/31/2012 23   05/13/2012 28*  02/01/2012 16      Creat (mg/dL)  Date Value  10/21/2010 1.33*     Creatinine (mg/dL)  Date Value  05/13/2012 1.4*     Creatinine, Ser (mg/dL)  Date Value  01/19/2013 1.40*  01/08/2013 1.11*  07/31/2012 1.3*    01/08/13: A1C 12.3% AST:  13 ALT:  15 Total cholesterol:  119 Triglycerides:  170 HDL:  42 LDL:  43  Assessment:    Diabetes Mellitus type II, under poor control.  BP at goal <140/80 LDL at goal <100   Plan:    1.  Rx changes: Stop Byetta.  Start Humalog 10 units with breakfast and supper.  Increase Lantus to 70 units daily (split into 2 doses to improve absorption). 2.  Counseled on how uncontrolled DM is contributing to her continued vaginal yeast infections. 3.  Reviewed nutrition goals. 4.  Counseled on benefit of routine exercise.  Goal is 30-45 minutes 5 x week. 5.  BP at goal <140/80.  Continue metoprolol and furosemide. 6.  LDL at goal <100.  Continue pravastatin. 7.  Finish metronidazole for her vaginal yeast.   8.  Complains of dyspepsia and gas.  Change administration time of pantoprazole to  bedtime to see if this helps symptoms.

## 2013-02-04 ENCOUNTER — Other Ambulatory Visit: Payer: Self-pay | Admitting: Internal Medicine

## 2013-02-04 ENCOUNTER — Ambulatory Visit
Admission: RE | Admit: 2013-02-04 | Discharge: 2013-02-04 | Disposition: A | Discharge status: Home / Self Care | Payer: Medicare Other | Source: Ambulatory Visit | Attending: Internal Medicine | Admitting: Internal Medicine

## 2013-02-04 DIAGNOSIS — R928 Other abnormal and inconclusive findings on diagnostic imaging of breast: Secondary | ICD-10-CM

## 2013-02-06 ENCOUNTER — Ambulatory Visit (INDEPENDENT_AMBULATORY_CARE_PROVIDER_SITE_OTHER): Payer: Medicare Other | Admitting: Nurse Practitioner

## 2013-02-06 ENCOUNTER — Encounter: Payer: Self-pay | Admitting: Nurse Practitioner

## 2013-02-06 VITALS — BP 138/70 | HR 62 | Temp 98.2°F | Wt 345.0 lb

## 2013-02-06 DIAGNOSIS — M1990 Unspecified osteoarthritis, unspecified site: Secondary | ICD-10-CM

## 2013-02-06 DIAGNOSIS — Z7901 Long term (current) use of anticoagulants: Secondary | ICD-10-CM

## 2013-02-06 DIAGNOSIS — A499 Bacterial infection, unspecified: Secondary | ICD-10-CM

## 2013-02-06 DIAGNOSIS — E1129 Type 2 diabetes mellitus with other diabetic kidney complication: Secondary | ICD-10-CM

## 2013-02-06 DIAGNOSIS — I10 Essential (primary) hypertension: Secondary | ICD-10-CM

## 2013-02-06 DIAGNOSIS — N760 Acute vaginitis: Secondary | ICD-10-CM

## 2013-02-06 LAB — POCT INR: INR: 3.8

## 2013-02-06 MED ORDER — OXYCODONE-ACETAMINOPHEN 10-325 MG PO TABS: ORAL_TABLET | ORAL | Status: DC

## 2013-02-06 NOTE — Patient Instructions (Addendum)
Hold coumadin today and tomorrow-- resume previous dosing on Saturday and follow up with coumadin clinic next week on Tuesday   To get florastor at the pharmacy this is an OTC medication (any probiotic would be helpful)  Cont Lantus at night and Humalog (with breakfast and supper)   Follow up in 6 weeks for blood sugar review

## 2013-02-06 NOTE — Progress Notes (Signed)
Patient ID: [name], female   DOB: 02-11-1935, 78 y.o.   MRN: [id number]   Allergies  Allergen Reactions  . Sulfonamide Derivatives Swelling    To mouth    Chief Complaint  Patient presents with  . Medical Managment of Chronic Issues    1 month f/u  . Immunizations    per pt Pnemo given about 5 yrs ago & Tdap not sure    HPI: Patient is a 78 y.o. female seen in the office today for routine follow up Does not have medication today  Has been following with [facility] pharm D regarding her diabetes; 3 days ago was seen and changes included taking lantus 35 units in 2 spots for better absorption and added Humalog 10 units at breakfast and supper; reports she has been compliant with these changes; noticed improvement in her blood sugars however does not have log.   Was seen in urgent care for vaginal yeast; getting better still having itching.   Has appt for sleep study for sleep apnea  Had mamogram and now is scheduled for a biopsy   Review of Systems:  Review of Systems  Constitutional: Positive for malaise/fatigue. Negative for fever, chills and diaphoresis.  HENT: Negative for congestion, sore throat and tinnitus.   Eyes: Negative for blurred vision.  Respiratory: Negative for cough and shortness of breath.   Cardiovascular: Negative for chest pain, palpitations and leg swelling.  Gastrointestinal: Negative for heartburn, nausea, vomiting, abdominal pain and diarrhea.  Genitourinary: Negative for dysuria and frequency.  Musculoskeletal: Positive for joint pain. Negative for myalgias and falls.  Skin: Positive for itching (in vaginal area). Negative for rash.  Neurological: Negative for dizziness, tingling, sensory change, weakness and headaches.  Psychiatric/Behavioral: Negative for depression and memory loss. The patient is not nervous/anxious and does not have insomnia.      Past Medical History  Diagnosis Date  . COPD (chronic obstructive pulmonary disease)    patient denies this  . Diabetes mellitus   . Diverticular disease   . Hypertension   . Hyperlipidemia   . Coronary artery disease     a. s/p IMI 2004 tx with BMS to RCA;  b. s/p Promus DES to RCA 2/12 (cath: LM 20-30%, pLAD 20-30%, mLAD 50%, RI 30%, pRCA 60%, mRCA 90% - tx with PCI);  c. myoview 1/07: EF 49%, inf scar, no isch  . Urinary incontinence   . Insomnia   . Osteoarthritis   . Morbid obesity   . Obstructive sleep apnea   . Polymyalgia rheumatica   . Anxiety   . Arthritis   . Depression   . Cholelithiasis   . CHF (congestive heart failure)   . GERD (gastroesophageal reflux disease)   . Pulmonary emboli 9/13    felt to need lifelong anticoagulation  . Debility   . Unspecified constipation   . Allergy   . Gout, unspecified   . Dysuria   . Urinary complications   . Pain, chronic   . Allergic rhinitis due to pollen   . Osteoarthrosis, unspecified whether generalized or localized, lower leg   . Type II or unspecified type diabetes mellitus without mention of complication, uncontrolled   . Anemia, unspecified   . Coronary atherosclerosis of native coronary artery   . Other malaise and fatigue    Past Surgical History  Procedure Laterality Date  . Coronary artery bypass graft    . Appendectomy    . Cardiac catheterization    . Gastric bypass  1977    reversed in 1979  . Blood clots/legs and lungs  2013  . Mi with stent placement  2004   Social History:   reports that she has never smoked. She does not have any smokeless tobacco history on file. She reports that she does not drink alcohol or use illicit drugs.  Family History  Problem Relation Age of Onset  . Breast cancer Mother   . Heart disease Mother   . Throat cancer Father   . Hypertension Father   . Arthritis Father   . Diabetes Father   . Arthritis Sister   . Obesity Sister   . Diabetes Sister     Medications: Patient's Medications  New Prescriptions   No medications on file  Previous  Medications   CLONAZEPAM (KLONOPIN) 1 MG TABLET    Take one tablet in the morning, and two at bedtime for sleep   COLCRYS 0.6 MG TABLET    TAKE 1 TABLET BY MOUTH EVERY DAY FOR GOUT   DIPHENHYDRAMINE-ZINC ACETATE (BENADRYL) CREAM    Apply topically 3 (three) times daily as needed for itching.   DOCUSATE SODIUM (COLACE) 100 MG CAPSULE    Take 1 capsule (100 mg total) by mouth 3 (three) times daily as needed for constipation.   DULOXETINE (CYMBALTA) 60 MG CAPSULE    TAKE ONE CAPSULE BY MOUTH EVERY DAY FOR ANXIETY   FUROSEMIDE (LASIX) 20 MG TABLET    TAKE 1 TABLET BY MOUTH TWICE A DAY FOR EDEMA   GLUCOSE BLOOD (FREESTYLE LITE) TEST STRIP    Check blood sugar twice daily. DX: 250.00   INSULIN GLARGINE (LANTUS SOLOSTAR) 100 UNIT/ML SOPN    Inject 70 Units into the skin daily.   INSULIN LISPRO (HUMALOG KWIKPEN) 100 UNIT/ML SOPN    Inject 10 Units into the skin 2 (two) times daily with a meal.   INSULIN PEN NEEDLE 32G X 4 MM MISC    Use with all pen devices (up to 10 needles per day)   ISOSORBIDE MONONITRATE (IMDUR) 60 MG 24 HR TABLET    Take 60 mg by mouth daily.    LANCETS (FREESTYLE) LANCETS    1 each by Other route as needed for other. Use to check diabetes twice a day Dx. 250.02   METOPROLOL SUCCINATE (TOPROL-XL) 25 MG 24 HR TABLET    TAKE 1 TABLET BY MOUTH ONCE EVERY DAY FOR BLOOD PRESSURE   METRONIDAZOLE (FLAGYL) 500 MG TABLET    Take one tablet twice daily for 7 days for yeast infection   NITROSTAT 0.4 MG SL TABLET    TAKE 1 TABLET UNDER THE TONGUE EVERY 5 MINUTES AS NEEDED CHEST PAIN   OXYCODONE-ACETAMINOPHEN (PERCOCET) 10-325 MG PER TABLET    Take one tablet every 6 hours as needed for pain   PANTOPRAZOLE (PROTONIX) 40 MG TABLET    TAKE 1 TABLET BY MOUTH EVERY DAY   POLYETHYLENE GLYCOL (MIRALAX / GLYCOLAX) PACKET    Take 17 g by mouth daily. Mix 17 grams in a 8 oz glass of water or juice as needed for constipation   PRAVASTATIN (PRAVACHOL) 20 MG TABLET    TAKE 1 TABLET BY MOUTH ONCE EVERY DAY  FOR CHOLESTEROL   SENNA (SENOKOT) 8.6 MG TABS    Take 1 tablet by mouth. Take two tablets a day   WARFARIN (COUMADIN) 5 MG TABLET    Take 5-7.5 mg by mouth daily. Take as directed by Anticoagulation clinic. Pt to take 7.5 mg every day except  on Thursday pt takes 5 mg.  Modified Medications   No medications on file  Discontinued Medications   EXENATIDE (BYETTA 10 MCG PEN) 10 MCG/0.04ML SOPN INJECTION    Inject 0.04 mL (10 mcg total) into the skin 2 (two) times daily with a meal.     Physical Exam:  Filed Vitals:   02/06/13 1022  BP: 138/70  Pulse: 62  Temp: 98.2 F (36.8 C)  TempSrc: Oral  Weight: 345 lb (156.491 kg)  SpO2: 97%    Physical Exam  Constitutional: She is oriented to person, place, and time and well-developed, well-nourished, and in no distress. No distress.  Cardiovascular: Normal rate, regular rhythm and normal heart sounds.   Pulmonary/Chest: Effort normal and breath sounds normal. No respiratory distress.  Abdominal: Soft. Bowel sounds are normal. She exhibits no distension.  Musculoskeletal: She exhibits no edema and no tenderness.  Neurological: She is alert and oriented to person, place, and time.  Skin: Skin is warm and dry. She is not diaphoretic. No erythema.   Normal diabetic foot exam  Labs reviewed: Basic Metabolic Panel:  Recent Labs  07/31/12 0905 01/08/13 1617 01/19/13 1520  NA 138 133* 134*  K 4.7 4.5 4.7  CL 100 91* 96  CO2 29 27  --   GLUCOSE 209* 380* 418*  BUN 23 17 20   CREATININE 1.3* 1.11* 1.40*  CALCIUM 8.9 9.3  --    Liver Function Tests:  Recent Labs  05/13/12 01/08/13 1617  AST 12* 13  ALT 18 15  ALKPHOS 86 97  BILITOT  --  0.2  PROT  --  6.8   No results found for this basename: LIPASE, AMYLASE,  in the last 8760 hours No results found for this basename: AMMONIA,  in the last 8760 hours CBC:  Recent Labs  05/13/12  07/31/12 0905 09/17/12 1421 01/08/13 1617 01/19/13 1520 01/19/13 1547  WBC  --   --  10.0  10.2 9.0  --  8.3  NEUTROABS 4  --  5.7 5.3 4.6  --   --   HGB 9.8*  --  11.0* 10.6* 11.2 13.3 11.1*  HCT 31*  --  34.2* 33.0* 34.6 39.0 34.8*  MCV  --   < > 84.2 85 86  --  87.9  PLT 321  --  319.0  --   --   --  269  < > = values in this interval not displayed. Lipid Panel:  Recent Labs  05/13/12 01/08/13 1617  CHOL 136  --   HDL 39 42  LDLCALC 65 43  TRIG 160 170*  CHOLHDL  --  2.8     Assessment/Plan 1. Long term (current) use of anticoagulants Pt reports she was placed on flagyl due to yeast infection and has 2 more days of treatment.  Current INR 3.8 to hold coumadin for 2 days and go to coumadin clinic in 5 days for follow up - POC INR  2. Type II or unspecified type diabetes mellitus with renal manifestations, uncontrolled(250.42) Cont lantus at night and humalog with meals; encouraged diet and medication compliance   3. Osteoarthrosis, unspecified whether generalized or localized, unspecified site Refill provided - oxyCODONE-acetaminophen (PERCOCET) 10-325 MG per tablet; Take one tablet every 6 hours as needed for pain  Dispense: 120 tablet; Refill: 0  4. Essential hypertension, benign -stable on current medications; will cont current medications  5. Vaginosis   Pt reports she still has itching but is better; to follow up if she  has ongoing vaginal drainage; itching and discomfort  follow up in 6 weeks for blood sugar and review

## 2013-02-08 ENCOUNTER — Other Ambulatory Visit (HOSPITAL_COMMUNITY): Payer: Self-pay | Admitting: Internal Medicine

## 2013-02-10 ENCOUNTER — Ambulatory Visit
Admission: RE | Admit: 2013-02-10 | Discharge: 2013-02-10 | Disposition: A | Discharge status: Home / Self Care | Payer: Medicare Other | Source: Ambulatory Visit | Attending: Internal Medicine | Admitting: Internal Medicine

## 2013-02-10 DIAGNOSIS — R928 Other abnormal and inconclusive findings on diagnostic imaging of breast: Secondary | ICD-10-CM

## 2013-02-10 HISTORY — PX: BREAST BIOPSY: SHX20

## 2013-02-12 ENCOUNTER — Ambulatory Visit: Payer: Medicare Other | Admitting: Nurse Practitioner

## 2013-02-24 ENCOUNTER — Encounter: Payer: Self-pay | Admitting: Pulmonary Disease

## 2013-02-24 ENCOUNTER — Ambulatory Visit (INDEPENDENT_AMBULATORY_CARE_PROVIDER_SITE_OTHER): Payer: Medicare Other | Admitting: Pulmonary Disease

## 2013-02-24 VITALS — BP 128/64 | HR 72 | Temp 98.1°F | Ht 67.0 in | Wt 347.4 lb

## 2013-02-24 DIAGNOSIS — G4733 Obstructive sleep apnea (adult) (pediatric): Secondary | ICD-10-CM | POA: Insufficient documentation

## 2013-02-24 NOTE — Patient Instructions (Signed)
Will start on cpap at a moderate pressure level.  Please call if you are having issues with tolerance. Wear cpap for 35min 2-3 times during the day while awake to get used to the machine.  Once doing well, can stop doing this. Work on weight loss. followup with me in 6 weeks.

## 2013-02-24 NOTE — Assessment & Plan Note (Signed)
The patient has extremely severe obstructive sleep apnea that has not been treated over the last 8 years.  She has significant comorbid medical issues that can be greatly affected by her sleep disordered breathing.  I've had a long discussion with her about the pathophysiology of sleep apnea, including its impact to her cardiovascular health and quality of life.  I have recommended a trial of CPAP while working on weight loss, and the patient is agreeable to trying this. I will set the patient up on cpap at a moderate pressure level to allow for desensitization, and will troubleshoot the device over the next 4-6weeks if needed.  The pt is to call me if having issues with tolerance.  Will then optimize the pressure once patient is able to wear cpap on a consistent basis.

## 2013-02-24 NOTE — Progress Notes (Signed)
Subjective:    Patient ID: [name], female    DOB: 1934/11/09, 78 y.o.   MRN: [id number]  HPI The patient is a 78 year old female who I have been asked to see for management of obstructive sleep apnea.  She had a sleep study in 2006 which showed an AHI of 119 events per hour, with desaturation as low as 86%.  She was never treated for this, but has continued to have mild snoring as well as classic gasping arousals.  She has frequent awakenings at night, and is not rested in the mornings upon arising.  She notes significant daytime sleepiness, and will follow sleep anytime she sits down day or night.  The patient states that her weight is down from 2 years ago, but she has gained at least 50 pounds back over the last 2 years.  Her Epworth score today is very abnormal at 16.   Sleep Questionnaire What time do you typically go to bed?( Between what hours) 1-3am 1-3am at 0958 on 02/24/13 by Virl Cagey, CMA How long does it take you to fall asleep? 2-3 hours 2-3 hours at 0958 on 02/24/13 by Virl Cagey, CMA How many times during the night do you wake up? 4 4 at 0958 on 02/24/13 by Virl Cagey, CMA What time do you get out of bed to start your day? No Value 11am-12pm at 0958 on 02/24/13 by Virl Cagey, CMA Do you drive or operate heavy machinery in your occupation? No No at 0958 on 02/24/13 by Virl Cagey, CMA How much has your weight changed (up or down) over the past two years? (In pounds) 50 lb (22.68 kg) 50 lb (22.68 kg) at 0958 on 02/24/13 by Virl Cagey, CMA Have you ever had a sleep study before? Yes Yes at 0958 on 02/24/13 by Virl Cagey, CMA If yes, location of study? [facility] at 0958 on 02/24/13 by Virl Cagey, CMA If yes, date of study? 09/2004 09/2004 at 0958 on 02/24/13 by Virl Cagey, CMA Do you currently use CPAP? No No at 0958 on 02/24/13 by Virl Cagey, CMA Do you wear oxygen at any time? No No at 0958 on 02/24/13 by Virl Cagey, CMA   Review of Systems  Constitutional: Positive for unexpected weight change. Negative for fever.  HENT: Positive for trouble swallowing. Negative for congestion, dental problem, ear pain, nosebleeds, postnasal drip, rhinorrhea, sinus pressure, sneezing and sore throat.   Eyes: Negative for redness and itching.  Respiratory: Positive for shortness of breath. Negative for cough, chest tightness and wheezing.   Cardiovascular: Negative for palpitations and leg swelling.  Gastrointestinal: Negative for nausea and vomiting.       Acid heartburn  Genitourinary: Negative for dysuria.  Musculoskeletal: Positive for arthralgias and joint swelling.  Skin: Positive for rash ( ithcing).  Neurological: Negative for headaches.  Hematological: Does not bruise/bleed easily.  Psychiatric/Behavioral: Positive for dysphoric mood. The patient is nervous/anxious.        Objective:   Physical Exam Constitutional:  Morbidly obese female, no acute distress  HENT:  Nares patent without discharge  Oropharynx without exudate, palate and uvula are thickened and mildly elongated.   Eyes:  Perrla, eomi, no scleral icterus  Neck:  No JVD, no TMG  Cardiovascular:  Normal rate, regular rhythm, no rubs or gallops.  No murmurs        Intact distal pulses  Pulmonary :  Normal breath sounds, no  stridor or respiratory distress   No rales, rhonchi, or wheezing  Abdominal:  Soft, nondistended, bowel sounds present.  No tenderness noted.   Musculoskeletal:  minimal lower extremity edema noted.  Lymph Nodes:  No cervical lymphadenopathy noted  Skin:  No cyanosis noted  Neurologic:  Appears mildly sleepy, but appropriate, moves all 4 extremities without obvious deficit.         Assessment & Plan:

## 2013-02-25 ENCOUNTER — Telehealth: Payer: Self-pay | Admitting: *Deleted

## 2013-02-25 NOTE — Telephone Encounter (Signed)
Arlo called and stated he received a call last night from patient stating her leg was throbbing and very painful. Arlo wanted me to call and check on patient this morning because he told her to go to the ER last night. I called patient and she stated that she did not go and that she was still in pain and was hurting worse this morning. We didn't have any appointment today so I told patient that she needed to go ahead and go to Urgent Care or the ER. She stated that is was very painful and burning up through her leg. Confirmed with patient that she would go to Urgent care or ER. She agreed.

## 2013-02-26 ENCOUNTER — Ambulatory Visit: Payer: Medicare Other | Admitting: Nurse Practitioner

## 2013-02-27 ENCOUNTER — Ambulatory Visit (INDEPENDENT_AMBULATORY_CARE_PROVIDER_SITE_OTHER): Payer: Medicare Other | Admitting: General Practice

## 2013-02-27 DIAGNOSIS — Z7901 Long term (current) use of anticoagulants: Secondary | ICD-10-CM

## 2013-02-27 DIAGNOSIS — I2699 Other pulmonary embolism without acute cor pulmonale: Secondary | ICD-10-CM

## 2013-02-27 LAB — POCT INR: INR: 2.5

## 2013-03-04 ENCOUNTER — Other Ambulatory Visit (HOSPITAL_COMMUNITY): Payer: Self-pay | Admitting: Cardiology

## 2013-03-10 ENCOUNTER — Other Ambulatory Visit (HOSPITAL_COMMUNITY): Payer: Self-pay | Admitting: Cardiology

## 2013-03-10 ENCOUNTER — Other Ambulatory Visit: Payer: Self-pay | Admitting: Nurse Practitioner

## 2013-03-10 ENCOUNTER — Other Ambulatory Visit (HOSPITAL_COMMUNITY): Payer: Self-pay | Admitting: Internal Medicine

## 2013-03-10 ENCOUNTER — Other Ambulatory Visit: Payer: Self-pay | Admitting: Internal Medicine

## 2013-03-11 ENCOUNTER — Ambulatory Visit: Payer: Medicare Other | Admitting: *Deleted

## 2013-03-12 ENCOUNTER — Encounter: Payer: Medicare Other | Attending: Internal Medicine | Admitting: *Deleted

## 2013-03-12 ENCOUNTER — Other Ambulatory Visit: Payer: Self-pay | Admitting: *Deleted

## 2013-03-12 DIAGNOSIS — E1122 Type 2 diabetes mellitus with diabetic chronic kidney disease: Secondary | ICD-10-CM

## 2013-03-12 DIAGNOSIS — IMO0002 Reserved for concepts with insufficient information to code with codable children: Secondary | ICD-10-CM | POA: Insufficient documentation

## 2013-03-12 DIAGNOSIS — N183 Chronic kidney disease, stage 3 unspecified: Secondary | ICD-10-CM

## 2013-03-12 DIAGNOSIS — M1990 Unspecified osteoarthritis, unspecified site: Secondary | ICD-10-CM

## 2013-03-12 DIAGNOSIS — Z713 Dietary counseling and surveillance: Secondary | ICD-10-CM | POA: Insufficient documentation

## 2013-03-12 DIAGNOSIS — E1165 Type 2 diabetes mellitus with hyperglycemia: Secondary | ICD-10-CM | POA: Insufficient documentation

## 2013-03-12 MED ORDER — OXYCODONE-ACETAMINOPHEN 10-325 MG PO TABS: ORAL_TABLET | ORAL | Status: DC

## 2013-03-12 NOTE — Telephone Encounter (Signed)
Patient dropped off forms for Diabetic shoes. Given to [place] to fill out

## 2013-03-12 NOTE — Telephone Encounter (Signed)
[name] filled out Diabetic shoe form and called patient to pick up

## 2013-03-12 NOTE — Progress Notes (Signed)
DIABETES follow up: Ms Goddu presents in her wheel chair accompanied by her daughter with whom she lives.No lifestyle modifications have been made that were discussed last visit. ACTIVITY: She notes that she is depressed, comfortable remaining in her bed all the time. Her only daily activity is getting up to go to the bedside commode. She goes to healthcare appointments and to church weekly.  GLUCOSE: FBS today was 202 mg/dl. Last visit was 329mg /dl. She tests FBS only 3 times per week. Her daughter has a log of readings which she did not bring. SYMPTOMATIC: Notes profound thirst, hunger MEDICATION: Lantus has been increased from 65 units daily to 70 units daily which may have contributed to her decrease in FBS.  Byetta has been discontinued. NUTRITION: Eats popcorn 2 large bowls for snack at night.  PLAN: -Walk to bathroom instead of using bedside commode. -Place handicap bar next to toilet for assistance -sit in recliner during the day -Return to bed and nap in the afternoon -Test glucose in the morning and 2 hours after a meal on other days -Bring glucose readings with you next visit -Daughter to email me some readings -Discuss with [name] NP, the potential additional increase of Lantus -Address Depression with potential SSRI -Patient noted that she felt clonazepam was sedating her during the day which kept her unmotivated and in the bed (consider wean off day time doses) -Return for f/u in January

## 2013-03-13 ENCOUNTER — Ambulatory Visit (INDEPENDENT_AMBULATORY_CARE_PROVIDER_SITE_OTHER): Payer: Medicare Other | Admitting: *Deleted

## 2013-03-13 DIAGNOSIS — Z7901 Long term (current) use of anticoagulants: Secondary | ICD-10-CM

## 2013-03-13 DIAGNOSIS — I2699 Other pulmonary embolism without acute cor pulmonale: Secondary | ICD-10-CM

## 2013-03-13 LAB — POCT INR: INR: 2.1

## 2013-03-17 ENCOUNTER — Other Ambulatory Visit (HOSPITAL_COMMUNITY): Payer: Self-pay | Admitting: Internal Medicine

## 2013-03-19 ENCOUNTER — Ambulatory Visit (INDEPENDENT_AMBULATORY_CARE_PROVIDER_SITE_OTHER): Payer: Medicare Other | Admitting: Nurse Practitioner

## 2013-03-19 ENCOUNTER — Encounter: Payer: Self-pay | Admitting: Nurse Practitioner

## 2013-03-19 ENCOUNTER — Ambulatory Visit: Payer: Medicare Other | Admitting: Nurse Practitioner

## 2013-03-19 VITALS — BP 134/88 | HR 77 | Temp 98.4°F | Wt 345.0 lb

## 2013-03-19 DIAGNOSIS — E785 Hyperlipidemia, unspecified: Secondary | ICD-10-CM

## 2013-03-19 DIAGNOSIS — I5032 Chronic diastolic (congestive) heart failure: Secondary | ICD-10-CM

## 2013-03-19 DIAGNOSIS — I10 Essential (primary) hypertension: Secondary | ICD-10-CM

## 2013-03-19 DIAGNOSIS — G4733 Obstructive sleep apnea (adult) (pediatric): Secondary | ICD-10-CM

## 2013-03-19 DIAGNOSIS — D649 Anemia, unspecified: Secondary | ICD-10-CM

## 2013-03-19 DIAGNOSIS — I509 Heart failure, unspecified: Secondary | ICD-10-CM

## 2013-03-19 DIAGNOSIS — E1129 Type 2 diabetes mellitus with other diabetic kidney complication: Secondary | ICD-10-CM

## 2013-03-19 DIAGNOSIS — E1165 Type 2 diabetes mellitus with hyperglycemia: Secondary | ICD-10-CM

## 2013-03-19 DIAGNOSIS — F329 Major depressive disorder, single episode, unspecified: Secondary | ICD-10-CM

## 2013-03-19 DIAGNOSIS — F3289 Other specified depressive episodes: Secondary | ICD-10-CM

## 2013-03-19 MED ORDER — INSULIN GLARGINE 100 UNIT/ML SOLOSTAR PEN: PEN_INJECTOR | SUBCUTANEOUS | Status: DC

## 2013-03-19 NOTE — Patient Instructions (Signed)
Please make appt in mid-late January for EV with lab work before visit.  Bring blood sugar log  -start gratitude journal and write in this every day -exercise 30 mins 5 days a week   -increase lantus to 76 units daily (use 2 shots of 38 units each)

## 2013-03-19 NOTE — Progress Notes (Signed)
Patient ID: [name], female   DOB: June 26, 1934, 78 y.o.   MRN: [id number]   Allergies  Allergen Reactions  . Sulfonamide Derivatives Swelling    To mouth    Chief Complaint  Patient presents with  . Medical Managment of Chronic Issues    6 week follow-up     HPI: Patient is a 78 y.o. female seen in the office today for follow up on diabetes; went to see the diabetic nutritious last week and was told to bring her blood sugar logs to her visit today; pt does NOT have blood sugar log, here with daughter; reports blood sugars fasting over 200; States that she has had no hypoglycemic episodes Not able to follow diabetic diet; she is hungry all the time. Is drinking a lot of water and eating a lot of ice  Has reduced klonopin to nighttime only; more awake, anxiety has been good. Reports she does get stressed a lot.  Had sleep study done [facility]- can not afford machine  Therefore she is not going to be able to get cpap Review of Systems:  Review of Systems  Constitutional: Negative for fever, chills and weight loss.  Cardiovascular: Negative for chest pain and leg swelling.  Gastrointestinal: Positive for constipation (stable on medications).  Genitourinary: Negative for dysuria.  Musculoskeletal: Positive for joint pain and myalgias.  Skin: Negative.   Neurological: Negative for weakness and headaches.  Psychiatric/Behavioral: Negative for depression. The patient is nervous/anxious.      Past Medical History  Diagnosis Date  . COPD (chronic obstructive pulmonary disease)     patient denies this  . Diabetes mellitus   . Diverticular disease   . Hypertension   . Hyperlipidemia   . Coronary artery disease     a. s/p IMI 2004 tx with BMS to RCA;  b. s/p Promus DES to RCA 2/12 (cath: LM 20-30%, pLAD 20-30%, mLAD 50%, RI 30%, pRCA 60%, mRCA 90% - tx with PCI);  c. myoview 1/07: EF 49%, inf scar, no isch  . Urinary incontinence   . Insomnia   . Osteoarthritis   . Morbid obesity    . Obstructive sleep apnea   . Polymyalgia rheumatica   . Anxiety   . Arthritis   . Depression   . Cholelithiasis   . CHF (congestive heart failure)   . GERD (gastroesophageal reflux disease)   . Pulmonary emboli 9/13    felt to need lifelong anticoagulation  . Debility   . Unspecified constipation   . Allergy   . Gout, unspecified   . Dysuria   . Urinary complications   . Pain, chronic   . Allergic rhinitis due to pollen   . Osteoarthrosis, unspecified whether generalized or localized, lower leg   . Type II or unspecified type diabetes mellitus without mention of complication, uncontrolled   . Anemia, unspecified   . Coronary atherosclerosis of native coronary artery   . Other malaise and fatigue    Past Surgical History  Procedure Laterality Date  . Coronary artery bypass graft    . Appendectomy    . Cardiac catheterization    . Gastric bypass  1977    reversed in 1979  . Blood clots/legs and lungs  2013  . Mi with stent placement  2004   Social History:   reports that she has never smoked. She does not have any smokeless tobacco history on file. She reports that she does not drink alcohol or use illicit drugs.  Family  History  Problem Relation Age of Onset  . Breast cancer Mother   . Heart disease Mother   . Throat cancer Father   . Hypertension Father   . Arthritis Father   . Diabetes Father   . Arthritis Sister   . Obesity Sister   . Diabetes Sister     Medications: Patient's Medications  New Prescriptions   No medications on file  Previous Medications   CLONAZEPAM (KLONOPIN) 1 MG TABLET    Take one tablet in the morning, and two at bedtime for sleep   COLCRYS 0.6 MG TABLET    TAKE 1 TABLET BY MOUTH EVERY DAY FOR GOUT   DIPHENHYDRAMINE-ZINC ACETATE (BENADRYL) CREAM    Apply topically 3 (three) times daily as needed for itching.   DOCUSATE SODIUM (COLACE) 100 MG CAPSULE    Take 1 capsule (100 mg total) by mouth 3 (three) times daily as needed for  constipation.   DULOXETINE (CYMBALTA) 60 MG CAPSULE    TAKE ONE CAPSULE BY MOUTH EVERY DAY FOR ANXIETY   FUROSEMIDE (LASIX) 20 MG TABLET    TAKE 1 TABLET BY MOUTH TWICE A DAY FOR EDEMA   GLUCOSE BLOOD (FREESTYLE LITE) TEST STRIP    Check blood sugar twice daily. DX: 250.00   INSULIN GLARGINE (LANTUS SOLOSTAR) 100 UNIT/ML SOPN    Inject 70 units once daily   INSULIN LISPRO (HUMALOG KWIKPEN) 100 UNIT/ML SOPN    Inject 10 Units into the skin 2 (two) times daily with a meal.   INSULIN PEN NEEDLE 32G X 4 MM MISC    Use with all pen devices (up to 10 needles per day)   ISOSORBIDE MONONITRATE (IMDUR) 60 MG 24 HR TABLET    Take 60 mg by mouth daily.    LANCETS (FREESTYLE) LANCETS    1 each by Other route as needed for other. Use to check diabetes twice a day Dx. 250.02   METOPROLOL SUCCINATE (TOPROL-XL) 25 MG 24 HR TABLET    TAKE 1 TABLET BY MOUTH ONCE EVERY DAY FOR BLOOD PRESSURE   MICONAZOLE (MICOTIN) 2 % CREAM    Apply 1 application topically as needed.   NITROSTAT 0.4 MG SL TABLET    TAKE 1 TABLET UNDER THE TONGUE EVERY 5 MINUTES AS NEEDED CHEST PAIN   OXYCODONE-ACETAMINOPHEN (PERCOCET) 10-325 MG PER TABLET    Take one tablet every 6 hours as needed for pain   PANTOPRAZOLE (PROTONIX) 40 MG TABLET    TAKE 1 TABLET BY MOUTH EVERY DAY   PHENYLEPHRINE-APAP-GUAIFENESIN (MUCINEX SINUS-MAX CONGESTION) 5-325-200 MG TABS    Take 1 tablet by mouth as needed.   POLYETHYLENE GLYCOL (MIRALAX / GLYCOLAX) PACKET    Take 17 g by mouth daily. Mix 17 grams in a 8 oz glass of water or juice as needed for constipation   PRAVASTATIN (PRAVACHOL) 20 MG TABLET    TAKE 1 TABLET BY MOUTH ONCE EVERY DAY FOR CHOLESTEROL   SENNA (SENOKOT) 8.6 MG TABS    Take 1 tablet by mouth. Take two tablets a day   WARFARIN (COUMADIN) 5 MG TABLET    TAKE AS DIRECTED BY ANTICOAGULATION CLINIC  Modified Medications   No medications on file  Discontinued Medications   COLCRYS 0.6 MG TABLET    TAKE 1 TABLET BY MOUTH EVERY DAY FOR GOUT    FUROSEMIDE (LASIX) 20 MG TABLET    TAKE 1 TABLET BY MOUTH TWICE A DAY FOR EDEMA   METOPROLOL SUCCINATE (TOPROL-XL) 25 MG 24 HR TABLET  TAKE 1 TABLET BY MOUTH ONCE EVERY DAY FOR BLOOD PRESSURE   PANTOPRAZOLE (PROTONIX) 40 MG TABLET    TAKE 1 TABLET BY MOUTH EVERY DAY     Physical Exam:  Filed Vitals:   03/19/13 1502  BP: 134/88  Pulse: 77  Temp: 98.4 F (36.9 C)  TempSrc: Oral  Weight: 345 lb (156.491 kg)  SpO2: 97%   Physical Exam  Constitutional: She is oriented to person, place, and time and well-developed, well-nourished, and in no distress. No distress.  Cardiovascular: Normal rate, regular rhythm and normal heart sounds.   Pulmonary/Chest: Effort normal and breath sounds normal. No respiratory distress.  Abdominal: Soft. Bowel sounds are normal. She exhibits no distension.  Musculoskeletal: She exhibits no edema and no tenderness.  Neurological: She is alert and oriented to person, place, and time.  Skin: Skin is warm and dry. She is not diaphoretic.     Labs reviewed: Basic Metabolic Panel:  Recent Labs  07/31/12 0905 01/08/13 1617 01/19/13 1520  NA 138 133* 134*  K 4.7 4.5 4.7  CL 100 91* 96  CO2 29 27  --   GLUCOSE 209* 380* 418*  BUN 23 17 20   CREATININE 1.3* 1.11* 1.40*  CALCIUM 8.9 9.3  --    Liver Function Tests:  Recent Labs  05/13/12 01/08/13 1617  AST 12* 13  ALT 18 15  ALKPHOS 86 97  BILITOT  --  0.2  PROT  --  6.8   No results found for this basename: LIPASE, AMYLASE,  in the last 8760 hours No results found for this basename: AMMONIA,  in the last 8760 hours CBC:  Recent Labs  05/13/12  07/31/12 0905 09/17/12 1421 01/08/13 1617 01/19/13 1520 01/19/13 1547  WBC  --   --  10.0 10.2 9.0  --  8.3  NEUTROABS 4  --  5.7 5.3 4.6  --   --   HGB 9.8*  --  11.0* 10.6* 11.2 13.3 11.1*  HCT 31*  --  34.2* 33.0* 34.6 39.0 34.8*  MCV  --   < > 84.2 85 86  --  87.9  PLT 321  --  319.0  --   --   --  269  < > = values in this interval not  displayed. Lipid Panel:  Recent Labs  05/13/12 01/08/13 1617  CHOL 136  --   HDL 39 42  LDLCALC 65 43  TRIG 160 170*  CHOLHDL  --  2.8   TSH: No results found for this basename: TSH,  in the last 8760 hours A1C: No components found with this basename: A1C,     Assessment/Plan 1. Chronic diastolic congestive heart failure -stable conts on lasix daily; no signs of fluid retention or shortness of breath    2. HYPERTENSION Patient is stable; continue current regimen. Will monitor and make changes as necessary.  3. OSA (obstructive sleep apnea) -reports she is aware she has OSA but can not afford the 25 dollars a month to keep the machine at home; aware she needs it and this will help her feel better.  -will consider keeping machine after discussion  4. Type II or unspecified type diabetes mellitus with renal manifestations, uncontrolled(250.42) -pt does not have log but reports she has been taking her blood sugars and fasting are above 200; no low or hypoglycemic episodes - will adjust lantus at this time: Insulin Glargine (LANTUS SOLOSTAR) 100 UNIT/ML SOPN; Inject 76 units once daily (in 2 separate injections of  38 units each to help with insulin absorption)  Dispense: 3 mL; Refill: 4 -discussed in great details about not just eating to be eating; healthy snacks, and to make sure she is drinking enough water - Microalbumin, urine; Future - Hemoglobin A1c; Future  5. ANEMIA - CBC With differential/Platelet; Future  6. DEPRESSIVE DISORDER -reports some worry but overall depression ans anxiety has improved -cont to try to decrease klonopin use -suggested gratitude journal to focus on the positive and for her to log nightly entries  7. Other and unspecified hyperlipidemia -cont pravachol - Lipid panel; Future - Comprehensive metabolic panel; Future  To follow up when pt gets back in town (going out of town tomorrow until January) with fasting blood work before visit and to  have EV

## 2013-03-29 ENCOUNTER — Other Ambulatory Visit: Payer: Self-pay | Admitting: Nurse Practitioner

## 2013-03-31 ENCOUNTER — Other Ambulatory Visit: Payer: Self-pay

## 2013-03-31 MED ORDER — DOCUSATE SODIUM 100 MG PO CAPS: ORAL_CAPSULE | ORAL | Status: DC

## 2013-04-10 ENCOUNTER — Other Ambulatory Visit: Payer: Self-pay | Admitting: *Deleted

## 2013-04-10 DIAGNOSIS — M1990 Unspecified osteoarthritis, unspecified site: Secondary | ICD-10-CM

## 2013-04-10 MED ORDER — OXYCODONE-ACETAMINOPHEN 10-325 MG PO TABS: ORAL_TABLET | ORAL | Status: DC

## 2013-04-16 ENCOUNTER — Telehealth: Payer: Self-pay | Admitting: *Deleted

## 2013-04-16 NOTE — Telephone Encounter (Signed)
Patient daughter called and Left Message on voicemail and stated that her mother had a yeast infection and wanted something called in. I called her back and told her that patient needed an appointment to be evaluated and she stated that her mother went to the ER and would be evaluated there.

## 2013-05-03 ENCOUNTER — Other Ambulatory Visit (HOSPITAL_COMMUNITY): Payer: Self-pay | Admitting: Internal Medicine

## 2013-05-05 ENCOUNTER — Other Ambulatory Visit: Payer: Self-pay | Admitting: Obstetrics & Gynecology

## 2013-05-05 ENCOUNTER — Other Ambulatory Visit (HOSPITAL_COMMUNITY)
Admission: RE | Admit: 2013-05-05 | Discharge: 2013-05-05 | Disposition: A | Discharge status: Home / Self Care | Payer: Medicare Other | Source: Ambulatory Visit | Attending: Obstetrics & Gynecology | Admitting: Obstetrics & Gynecology

## 2013-05-05 DIAGNOSIS — Z124 Encounter for screening for malignant neoplasm of cervix: Secondary | ICD-10-CM | POA: Insufficient documentation

## 2013-05-05 DIAGNOSIS — Z1151 Encounter for screening for human papillomavirus (HPV): Secondary | ICD-10-CM | POA: Insufficient documentation

## 2013-05-05 DIAGNOSIS — N760 Acute vaginitis: Secondary | ICD-10-CM | POA: Insufficient documentation

## 2013-05-12 ENCOUNTER — Ambulatory Visit (INDEPENDENT_AMBULATORY_CARE_PROVIDER_SITE_OTHER): Payer: Medicare PPO | Admitting: Pharmacotherapy

## 2013-05-12 ENCOUNTER — Ambulatory Visit: Payer: Self-pay | Admitting: Pharmacotherapy

## 2013-05-12 ENCOUNTER — Encounter: Payer: Self-pay | Admitting: Pharmacotherapy

## 2013-05-12 VITALS — BP 152/64 | HR 87 | Temp 97.5°F | Resp 20 | Ht 67.0 in | Wt 353.0 lb

## 2013-05-12 DIAGNOSIS — E1129 Type 2 diabetes mellitus with other diabetic kidney complication: Secondary | ICD-10-CM

## 2013-05-12 DIAGNOSIS — E1165 Type 2 diabetes mellitus with hyperglycemia: Secondary | ICD-10-CM

## 2013-05-12 DIAGNOSIS — D649 Anemia, unspecified: Secondary | ICD-10-CM

## 2013-05-12 DIAGNOSIS — N183 Chronic kidney disease, stage 3 unspecified: Secondary | ICD-10-CM

## 2013-05-12 DIAGNOSIS — I10 Essential (primary) hypertension: Secondary | ICD-10-CM

## 2013-05-12 DIAGNOSIS — M1990 Unspecified osteoarthritis, unspecified site: Secondary | ICD-10-CM

## 2013-05-12 DIAGNOSIS — E785 Hyperlipidemia, unspecified: Secondary | ICD-10-CM

## 2013-05-12 MED ORDER — OXYCODONE-ACETAMINOPHEN 10-325 MG PO TABS: ORAL_TABLET | ORAL | Status: DC

## 2013-05-12 NOTE — Progress Notes (Signed)
  Subjective:    [name] is a 79 y.o.African American female who presents for follow-up of Type 2 diabetes mellitus.   Her logbook shows consistently elevated BG.   Average ~ 250m/dl Low:  1217mdl High:  32080ml She was in the hospital 12/10 and 12/11 for CP and earache.  She did not have MI.  Got a clear cardiac exam. She is currently on ABT for UTI   She is currently on Lantus 76 units daily. She is currently taking Humalog 12 units twice daily.  Not eating healthy.  She continues to skip meals - breakfast. She usually doesn't get out of bed until 12 noon. No routine exercise. She is complaining of polyuria and nocturia every 2-3 hours. She says that the bottom of her feet feel dry - a little numb. Denies problems with vision.  She is to get her labs done later this month.   Review of Systems A comprehensive review of systems was negative except for: Cardiovascular: positive for lower extremity edema Genitourinary: positive for nocturia Endocrine: positive for diabetic symptoms including polyuria and skin dryness    Objective:    BP 152/64  Pulse 87  Temp(Src) 97.5 F (36.4 C) (Oral)  Resp 20  Ht 5' 7"$  (1.702 m)  Wt 353 lb (160.12 kg)  BMI 55.27 kg/m2  SpO2 95%  General:  alert, cooperative, appears stated age, no distress and morbidly obese  Oropharynx: normal findings: lips normal without lesions and gums healthy   Eyes:  negative findings: lids and lashes normal and conjunctivae and sclerae normal   Ears:  external ears normal        Lung: clear to auscultation bilaterally  Heart:  regular rate and rhythm     Extremities: edema in hands and feet  Skin: dry     Neuro: mental status, speech normal, alert and oriented x3   Lab Review Glucose (mg/dL)  Date Value  01/08/2013 380*     Glucose, Bld (mg/dL)  Date Value  01/19/2013 418*  07/31/2012 209*  02/01/2012 167*     CO2 (mmol/L)  Date Value  01/08/2013 27   07/31/2012 29   02/01/2012 26       BUN (mg/dL)  Date Value  01/19/2013 20   01/08/2013 17   07/31/2012 23   05/13/2012 28*  02/01/2012 16      Creat (mg/dL)  Date Value  10/21/2010 1.33*     Creatinine (mg/dL)  Date Value  05/13/2012 1.4*     Creatinine, Ser (mg/dL)  Date Value  01/19/2013 1.40*  01/08/2013 1.11*  07/31/2012 1.3*    Labs today - A1C, CMP   Assessment:    Diabetes Mellitus type II, under fair control.  HTN above goal <140/80 CKD - stable Lipid - last LDL <100   Plan:    1.  Rx changes: Increase Humalog 16 units twice daily before meals. 2.  Continue Lantus 72 units daily. 3.  Counseled on nutrition goals.  Needs to stop skipping meals. 4.  Exercise goal is 30-45 minutes 5 x week. 5.  BP above goal today, usually OK.  May be pain related.  Continue metoprolol and furosemide. 6.  CKD - stable.  Not a candidate for SGLT-2 agent for DM. 7.  Lipid - continue pravastatin.

## 2013-05-12 NOTE — Patient Instructions (Signed)
Continue Lantus 76 units daily Increase Humalog to 16 units twice daily

## 2013-05-12 NOTE — Addendum Note (Signed)
Addended by: Jearld Adjutant on: 05/12/2013 11:13 AM   Modules accepted: Orders

## 2013-05-13 LAB — CBC WITH DIFFERENTIAL
Basophils Absolute: 0 10*3/uL (ref 0.0–0.2)
Basos: 0 %
Eos: 2 %
Eosinophils Absolute: 0.2 10*3/uL (ref 0.0–0.4)
HCT: 33.1 % — ABNORMAL LOW (ref 34.0–46.6)
Hemoglobin: 10.9 g/dL — ABNORMAL LOW (ref 11.1–15.9)
Immature Grans (Abs): 0 10*3/uL (ref 0.0–0.1)
Immature Granulocytes: 0 %
Lymphocytes Absolute: 3.3 10*3/uL — ABNORMAL HIGH (ref 0.7–3.1)
Lymphs: 40 %
MCH: 27.9 pg (ref 26.6–33.0)
MCHC: 32.9 g/dL (ref 31.5–35.7)
MCV: 85 fL (ref 79–97)
Monocytes Absolute: 0.6 10*3/uL (ref 0.1–0.9)
Monocytes: 7 %
Neutrophils Absolute: 4.2 10*3/uL (ref 1.4–7.0)
Neutrophils Relative %: 51 %
Platelets: 302 10*3/uL (ref 150–379)
RBC: 3.9 x10E6/uL (ref 3.77–5.28)
RDW: 14.2 % (ref 12.3–15.4)
WBC: 8.2 10*3/uL (ref 3.4–10.8)

## 2013-05-13 LAB — COMPREHENSIVE METABOLIC PANEL
ALT: 15 IU/L (ref 0–32)
AST: 18 IU/L (ref 0–40)
Albumin/Globulin Ratio: 1 — ABNORMAL LOW (ref 1.1–2.5)
Albumin: 3.4 g/dL — ABNORMAL LOW (ref 3.5–4.8)
Alkaline Phosphatase: 96 IU/L (ref 39–117)
BUN/Creatinine Ratio: 22 (ref 11–26)
BUN: 27 mg/dL (ref 8–27)
CO2: 26 mmol/L (ref 18–29)
Calcium: 8.6 mg/dL (ref 8.6–10.2)
Chloride: 100 mmol/L (ref 97–108)
Creatinine, Ser: 1.24 mg/dL — ABNORMAL HIGH (ref 0.57–1.00)
GFR calc Af Amer: 49 mL/min/{1.73_m2} — ABNORMAL LOW (ref >59)
GFR calc non Af Amer: 42 mL/min/{1.73_m2} — ABNORMAL LOW (ref >59)
Globulin, Total: 3.4 g/dL (ref 1.5–4.5)
Glucose: 132 mg/dL — ABNORMAL HIGH (ref 65–99)
Potassium: 4.3 mmol/L (ref 3.5–5.2)
Sodium: 140 mmol/L (ref 134–144)
Total Bilirubin: <0.2 mg/dL (ref 0.0–1.2)
Total Protein: 6.8 g/dL (ref 6.0–8.5)

## 2013-05-13 LAB — LIPID PANEL
Chol/HDL Ratio: 3.3 ratio units (ref 0.0–4.4)
Cholesterol, Total: 148 mg/dL (ref 100–199)
HDL: 45 mg/dL (ref >39)
LDL Calculated: 74 mg/dL (ref 0–99)
Triglycerides: 145 mg/dL (ref 0–149)
VLDL Cholesterol Cal: 29 mg/dL (ref 5–40)

## 2013-05-13 LAB — HEMOGLOBIN A1C
Est. average glucose Bld gHb Est-mCnc: 289 mg/dL
Hgb A1c MFr Bld: 11.7 % — ABNORMAL HIGH (ref 4.8–5.6)

## 2013-05-14 ENCOUNTER — Ambulatory Visit: Payer: Medicare Other | Admitting: *Deleted

## 2013-05-14 ENCOUNTER — Other Ambulatory Visit (HOSPITAL_COMMUNITY): Payer: Self-pay | Admitting: Internal Medicine

## 2013-05-14 ENCOUNTER — Ambulatory Visit: Payer: Medicare Other | Admitting: Pulmonary Disease

## 2013-05-20 ENCOUNTER — Other Ambulatory Visit: Payer: Self-pay | Admitting: Pharmacist

## 2013-05-20 MED ORDER — WARFARIN SODIUM 5 MG PO TABS: ORAL_TABLET | ORAL | Status: DC

## 2013-05-22 ENCOUNTER — Ambulatory Visit (INDEPENDENT_AMBULATORY_CARE_PROVIDER_SITE_OTHER): Payer: Medicare PPO | Admitting: *Deleted

## 2013-05-22 DIAGNOSIS — Z7901 Long term (current) use of anticoagulants: Secondary | ICD-10-CM

## 2013-05-22 DIAGNOSIS — I2699 Other pulmonary embolism without acute cor pulmonale: Secondary | ICD-10-CM

## 2013-05-22 LAB — POCT INR: INR: 2.1

## 2013-05-29 ENCOUNTER — Other Ambulatory Visit: Payer: Medicare PPO

## 2013-05-29 DIAGNOSIS — E119 Type 2 diabetes mellitus without complications: Secondary | ICD-10-CM

## 2013-05-30 LAB — MICROALBUMIN / CREATININE URINE RATIO
Creatinine, Ur: 65.4 mg/dL (ref 15.0–278.0)
MICROALB/CREAT RATIO: 13.1 mg/g creat (ref 0.0–30.0)
Microalbumin, Urine: 8.6 ug/mL (ref 0.0–17.0)

## 2013-06-02 ENCOUNTER — Encounter: Payer: Self-pay | Admitting: Cardiology

## 2013-06-02 ENCOUNTER — Other Ambulatory Visit: Payer: Medicare Other

## 2013-06-02 ENCOUNTER — Ambulatory Visit (INDEPENDENT_AMBULATORY_CARE_PROVIDER_SITE_OTHER): Payer: Medicare PPO | Admitting: Cardiology

## 2013-06-02 ENCOUNTER — Other Ambulatory Visit: Payer: Self-pay | Admitting: Cardiology

## 2013-06-02 VITALS — BP 118/78 | HR 89 | Ht 67.0 in | Wt 350.0 lb

## 2013-06-02 DIAGNOSIS — I5032 Chronic diastolic (congestive) heart failure: Secondary | ICD-10-CM

## 2013-06-02 DIAGNOSIS — I2699 Other pulmonary embolism without acute cor pulmonale: Secondary | ICD-10-CM

## 2013-06-02 DIAGNOSIS — I509 Heart failure, unspecified: Secondary | ICD-10-CM

## 2013-06-02 DIAGNOSIS — I10 Essential (primary) hypertension: Secondary | ICD-10-CM

## 2013-06-02 DIAGNOSIS — I2510 Atherosclerotic heart disease of native coronary artery without angina pectoris: Secondary | ICD-10-CM

## 2013-06-02 NOTE — Telephone Encounter (Signed)
error 

## 2013-06-02 NOTE — Patient Instructions (Signed)
Your physician recommends that you schedule a follow-up appointment as needed  

## 2013-06-02 NOTE — Progress Notes (Signed)
Patient ID: [name], female   DOB: 08-31-1934, 79 y.o.   MRN: [id number]    History of Present Illness: [name] is seen back today for a work in visit. She was seen by  Dr. [name] in the past. She has multiple issues which include known CAD with past inferior MI in 2004. She has had bare-metal stent to the RCA in 2004 and drug-eluting stent placement to the RCA in 06/2010 in the setting of unstable angina with overall preserved LV function.  Last cath in 2012 showing otherwise nonobstructive disease. Her other problems include morbid obesity, severe OA - basically confined to a wheelchair, COPD, DM, HTN, HLD, incontinence, OSA, polymyalgia rheumatica, anxiety, depression and gallbladder disease. She is on chronic anticoagulation for PE since September of 2013. Echo last September 2013 was a TDS but with a normal EF noted.   The patient was seen in April 2014 by Truitt Merle after a ER visit in [place] for chest pain and medical management was decided. The patient has been doing overall well, with rare episodes of chest pain. In December 2014 she again went to the ER in [place] for chest pain and based on patient's report a cath was performed with patent stent and otherwise non-obstructive CAD. One more episode of CP since then that resolved quickly after sl NTG.   She is complaining of lack of sleep, needs CPAP, but cant afford it. She is morbidly obese and bed ridden other than walking to the bathroom. She drinks a lot of soda.    Current Outpatient Prescriptions on File Prior to Visit  Medication Sig Dispense Refill  . clonazePAM (KLONOPIN) 1 MG tablet TAKE 1 TABLET IN THE MORNING AND 2 TABLETS AT BEDTIME FOR SLEEP  90 tablet  1  . COLCRYS 0.6 MG tablet TAKE 1 TABLET BY MOUTH EVERY DAY FOR GOUT  30 tablet  3  . docusate sodium (COLACE) 100 MG capsule Take 1 capsule (100 mg total) by mouth 3 (three) times daily as needed for constipation.  90 capsule  5  . DULoxetine (CYMBALTA) 60 MG  capsule TAKE ONE CAPSULE BY MOUTH EVERY DAY FOR ANXIETY  30 capsule  3  . furosemide (LASIX) 20 MG tablet TAKE 1 TABLET BY MOUTH TWICE A DAY FOR EDEMA  60 tablet  3  . glucose blood (FREESTYLE LITE) test strip Check blood sugar twice daily. DX: 250.00  100 each  12  . Insulin Glargine (LANTUS SOLOSTAR) 100 UNIT/ML SOPN Inject 76 units once daily (in 2 separate injections of 38 units each to help with insulin absorption)  3 mL  4  . insulin lispro (HUMALOG KWIKPEN) 100 UNIT/ML SOPN Inject 10 Units into the skin 2 (two) times daily with a meal.  5 pen  4  . Insulin Pen Needle 32G X 4 MM MISC Use with all pen devices (up to 10 needles per day)  300 each  4  . isosorbide mononitrate (IMDUR) 60 MG 24 hr tablet Take 60 mg by mouth daily.       . Lancets (FREESTYLE) lancets 1 each by Other route as needed for other. Use to check diabetes twice a day Dx. 250.02      . metoprolol succinate (TOPROL-XL) 25 MG 24 hr tablet TAKE 1 TABLET BY MOUTH ONCE EVERY DAY FOR BLOOD PRESSURE  30 tablet  3  . miconazole (MICOTIN) 2 % cream Apply 1 application topically as needed.      [name] NITROSTAT 0.4  MG SL tablet TAKE 1 TABLET UNDER THE TONGUE EVERY 5 MINUTES AS NEEDED CHEST PAIN  50 tablet  0  . oxyCODONE-acetaminophen (PERCOCET) 10-325 MG per tablet Take one tablet every 6 hours as needed for pain  120 tablet  0  . pantoprazole (PROTONIX) 40 MG tablet TAKE 1 TABLET BY MOUTH EVERY DAY  30 tablet  3  . Phenylephrine-APAP-Guaifenesin (MUCINEX SINUS-MAX CONGESTION) 5-325-200 MG TABS Take 1 tablet by mouth as needed.      . polyethylene glycol (MIRALAX / GLYCOLAX) packet Take 17 g by mouth daily. Mix 17 grams in a 8 oz glass of water or juice as needed for constipation      . pravastatin (PRAVACHOL) 20 MG tablet TAKE 1 TABLET BY MOUTH ONCE EVERY DAY FOR CHOLESTEROL  30 tablet  5  . warfarin (COUMADIN) 5 MG tablet TAKE AS DIRECTED BY ANTICOAGULATION CLINIC  45 tablet  0   No current facility-administered medications on file  prior to visit.    Allergies  Allergen Reactions  . Sulfonamide Derivatives Swelling    To mouth    Past Medical History  Diagnosis Date  . COPD (chronic obstructive pulmonary disease)     patient denies this  . Diabetes mellitus   . Diverticular disease   . Hypertension   . Hyperlipidemia   . Coronary artery disease     a. s/p IMI 2004 tx with BMS to RCA;  b. s/p Promus DES to RCA 2/12 (cath: LM 20-30%, pLAD 20-30%, mLAD 50%, RI 30%, pRCA 60%, mRCA 90% - tx with PCI);  c. myoview 1/07: EF 49%, inf scar, no isch  . Urinary incontinence   . Insomnia   . Osteoarthritis   . Morbid obesity   . Obstructive sleep apnea   . Polymyalgia rheumatica   . Anxiety   . Arthritis   . Depression   . Cholelithiasis   . CHF (congestive heart failure)   . GERD (gastroesophageal reflux disease)   . Pulmonary emboli 9/13    felt to need lifelong anticoagulation  . Debility   . Unspecified constipation   . Allergy   . Gout, unspecified   . Dysuria   . Urinary complications   . Pain, chronic   . Allergic rhinitis due to pollen   . Osteoarthrosis, unspecified whether generalized or localized, lower leg   . Type II or unspecified type diabetes mellitus without mention of complication, uncontrolled   . Anemia, unspecified   . Coronary atherosclerosis of native coronary artery   . Other malaise and fatigue     Past Surgical History  Procedure Laterality Date  . Coronary artery bypass graft    . Appendectomy    . Cardiac catheterization    . Gastric bypass  1977    reversed in 1979  . Blood clots/legs and lungs  2013  . Mi with stent placement  2004    History  Smoking status  . Never Smoker   Smokeless tobacco  . Not on file    History  Alcohol Use No    Family History  Problem Relation Age of Onset  . Breast cancer Mother   . Heart disease Mother   . Throat cancer Father   . Hypertension Father   . Arthritis Father   . Diabetes Father   . Arthritis Sister   .  Obesity Sister   . Diabetes Sister     Review of Systems: The review of systems is per the HPI.  All  other systems were reviewed and are negative.  Physical Exam: BP 118/78  Pulse 89  Ht 5\' 7"  (1.702 m)  Wt 350 lb (158.759 kg)  BMI 54.80 kg/m2 Patient is alert and in no acute distress. She is morbidly obese. She is not able to stand to weigh. She reports her weight to be 319. She is in a wheelchair. Skin is warm and dry. Color is normal.  HEENT is unremarkable. Normocephalic/atraumatic. PERRL. Sclera are nonicteric. Neck is supple. No masses. No JVD. Lungs are fairly clear. Cardiac exam shows a regular rate and rhythm. Heart tones are quite distant.  Abdomen is obese but soft. Extremities with mild B/L edema. Gait is not tested. ROM appears intact. No gross neurologic deficits noted.  LABORATORY DATA:  EKG today shows inferior MI, incomplete RBBB. No acute changes.   Lab Results  Component Value Date   WBC 8.2 05/12/2013   HGB 10.9* 05/12/2013   HCT 33.1* 05/12/2013   PLT 302 05/12/2013   GLUCOSE 132* 05/12/2013   CHOL 136 05/13/2012   TRIG 145 05/12/2013   HDL 45 05/12/2013   LDLCALC 74 05/12/2013   ALT 15 05/12/2013   AST 18 05/12/2013   NA 140 05/12/2013   K 4.3 05/12/2013   CL 100 05/12/2013   CREATININE 1.24* 05/12/2013   BUN 27 05/12/2013   CO2 26 05/12/2013   TSH 0.785 01/26/2012   INR 2.1 05/22/2013   HGBA1C 11.7* 05/12/2013    Lab Results  Component Value Date   INR 2.1 05/22/2013   INR 2.1 03/13/2013   INR 2.5 02/27/2013    CORONARY ANGIOGRAPHY from Feb. 2012  1. The left mainstem is calcified. There is 20% to 30% distal left  main stenosis.  2. LAD: The LAD has mild diffuse stenosis. There is moderate  calcification present. The vessel gives off two diagonal branches.  The second is the larger branch. The proximal LAD has 20% to 30%  stenosis. The mid LAD has 50% stenosis.  3. Left circumflex: The left circumflex is also calcified. The  vessel is patent throughout. There is nonobstructive  stenosis in  the ramus intermedius which is the main branch vessel of the  circumflex. This has approximately 30% to 40% stenosis.  4. Right coronary artery. The right coronary artery is a large,  dominant vessel. There is a 60% proximal stenosis. The mid vessel  has an eccentric 90% lesion which is markedly progressed from the  previous study. The PDA and posterolateral branches are both  widely patent. There is moderate calcification throughout the  right coronary artery.  5. Left ventriculography shows normal LV function. The ejection  fraction is estimated at 55%.   ASSESSMENT:  1. Severe right coronary artery stenosis with successful percutaneous  intervention using a long drug-eluting stent in the mid vessel and  a 16-mm drug-eluting stents in the proximal vessel.  2. Nonobstructive left main, left anterior descending, and left  circumflex stenoses.  3. Normal left ventricular function.   RECOMMENDATIONS: The patient should be continued on dual antiplatelettherapy with aspirin and prasugrel for a minimum of 12 months.  Juanda Bond. Burt Knack, MD  MDC/MEDQ D: 06/22/2010 T: 06/23/2010 Job: [id number]    Echo Study Conclusions from September 2013  - Left ventricle: Difficult study. LV wall motion is good. I do not see an obvious wall motion abnormality. The cavity size was normal. Wall thickness was increased in a pattern of mild LVH. The estimated ejection fraction was 60%. Regional wall motion  abnormalities cannot be excluded. - Right ventricle: The cavity size was moderately dilated. Systolic function was moderately reduced. RV systolic pressure is probably in the 83mmHg range. - Right atrium: The atrium was mildly dilated.   Assessment / Plan:  1. Chest pain - with past hospitalization 1 month ago, cath in [place] with patent stent and non-obstructive CAD, we will continue medical management. We will obtain records from [facility]  2. Chronic diastolic CHF - minimal LE edema -  the patient is advised to stop drinking soda, she refuses increase in Lasix as she would have to leave bed more often.  3. Morbid obesity - this is the crux of her issues. She is advised to start walking, stop drinking soda.  4. OSA - tested and prescribed CPAP, that she cant afford, she would really benefit from it. Advised to save money over several months.  5. HTN - BP is controled  6. DVT/PE - on coumadin - therapeutic   Patient is agreeable to this plan and will call if any problems develop in the interim.  The patient requests no follow up unless needed as she is concern about copay.

## 2013-06-03 ENCOUNTER — Encounter: Payer: Self-pay | Admitting: *Deleted

## 2013-06-04 ENCOUNTER — Ambulatory Visit (INDEPENDENT_AMBULATORY_CARE_PROVIDER_SITE_OTHER): Payer: Medicare PPO | Admitting: Nurse Practitioner

## 2013-06-04 ENCOUNTER — Encounter: Payer: Self-pay | Admitting: Nurse Practitioner

## 2013-06-04 VITALS — BP 136/70 | HR 74 | Temp 97.1°F | Ht 67.0 in | Wt 354.6 lb

## 2013-06-04 DIAGNOSIS — E785 Hyperlipidemia, unspecified: Secondary | ICD-10-CM

## 2013-06-04 DIAGNOSIS — I10 Essential (primary) hypertension: Secondary | ICD-10-CM

## 2013-06-04 DIAGNOSIS — E1165 Type 2 diabetes mellitus with hyperglycemia: Principal | ICD-10-CM

## 2013-06-04 DIAGNOSIS — I509 Heart failure, unspecified: Secondary | ICD-10-CM

## 2013-06-04 DIAGNOSIS — M1990 Unspecified osteoarthritis, unspecified site: Secondary | ICD-10-CM

## 2013-06-04 DIAGNOSIS — F3289 Other specified depressive episodes: Secondary | ICD-10-CM

## 2013-06-04 DIAGNOSIS — L299 Pruritus, unspecified: Secondary | ICD-10-CM

## 2013-06-04 DIAGNOSIS — F329 Major depressive disorder, single episode, unspecified: Secondary | ICD-10-CM

## 2013-06-04 DIAGNOSIS — K219 Gastro-esophageal reflux disease without esophagitis: Secondary | ICD-10-CM

## 2013-06-04 DIAGNOSIS — G4733 Obstructive sleep apnea (adult) (pediatric): Secondary | ICD-10-CM

## 2013-06-04 DIAGNOSIS — I5032 Chronic diastolic (congestive) heart failure: Secondary | ICD-10-CM

## 2013-06-04 DIAGNOSIS — M109 Gout, unspecified: Secondary | ICD-10-CM

## 2013-06-04 DIAGNOSIS — E1129 Type 2 diabetes mellitus with other diabetic kidney complication: Secondary | ICD-10-CM

## 2013-06-04 MED ORDER — DULOXETINE HCL 60 MG PO CPEP: ORAL_CAPSULE | ORAL | Status: DC

## 2013-06-04 MED ORDER — ISOSORBIDE MONONITRATE ER 60 MG PO TB24: 60.0000 mg | ORAL_TABLET | Freq: Every day | ORAL | Status: DC

## 2013-06-04 MED ORDER — PANTOPRAZOLE SODIUM 40 MG PO TBEC: DELAYED_RELEASE_TABLET | ORAL | Status: DC

## 2013-06-04 MED ORDER — PRAVASTATIN SODIUM 20 MG PO TABS: ORAL_TABLET | ORAL | Status: DC

## 2013-06-04 MED ORDER — METOPROLOL SUCCINATE ER 25 MG PO TB24: ORAL_TABLET | ORAL | Status: DC

## 2013-06-04 MED ORDER — INSULIN GLARGINE 100 UNIT/ML SOLOSTAR PEN: PEN_INJECTOR | SUBCUTANEOUS | Status: DC

## 2013-06-04 MED ORDER — INSULIN LISPRO 100 UNIT/ML (KWIKPEN): 10.0000 [IU] | PEN_INJECTOR | Freq: Two times a day (BID) | SUBCUTANEOUS | Status: DC

## 2013-06-04 MED ORDER — COLCHICINE 0.6 MG PO TABS: ORAL_TABLET | ORAL | Status: DC

## 2013-06-04 MED ORDER — NYSTATIN 100000 UNIT/GM EX POWD: CUTANEOUS | Status: DC

## 2013-06-04 NOTE — Progress Notes (Signed)
Passed the clock drawing

## 2013-06-04 NOTE — Patient Instructions (Signed)
Clean eating recipes online  Increase activity by 5 mins a week until you get to 45 mins a day  Follow up in 3 months

## 2013-06-04 NOTE — Progress Notes (Signed)
Patient ID: [name], female   DOB: 27-Mar-1935, 79 y.o.   MRN: [id number]    Allergies  Allergen Reactions  . Sulfonamide Derivatives Swelling    To mouth    Chief Complaint  Patient presents with  . Annual Exam    Physical & discuss labs (printed)  . other    itching all the time especially where she sweats, having lots of mucus in the morning that she has to clear then she is okay the rest of the day, and wants medications for 90 days.    HPI: Patient is a 79 y.o. female seen in the office today for extended visit; has good days and bad days Pain worse on some days; takes percocet an will have to use heating pad all day;  Has tired to eat better; reports blood sugars have improved in the morning Tired to increase activity but only doing abt 10 mins of increased movement a day For constipation- takes ex-lax every night Cardiology managing coumadin  No signs of bleeding Was treated for UTI no ongoing symptoms   Review of Systems:  Review of Systems  Constitutional: Positive for malaise/fatigue. Negative for fever and chills.  HENT: Negative for congestion and sore throat.   Respiratory: Negative for cough, sputum production and shortness of breath.   Cardiovascular: Negative for chest pain, palpitations and leg swelling.  Gastrointestinal: Positive for heartburn (takes protonix) and constipation (on medication; which helps). Negative for nausea, vomiting, abdominal pain and diarrhea.  Genitourinary: Negative for dysuria, urgency and frequency.       Recently treated for UTI  Musculoskeletal: Positive for joint pain, myalgias and neck pain. Negative for falls.  Skin: Negative.   Neurological: Positive for weakness. Negative for dizziness, tingling, sensory change and headaches.  Psychiatric/Behavioral: Negative for depression. The patient is not nervous/anxious.      Past Medical History  Diagnosis Date  . COPD (chronic obstructive pulmonary disease)     patient  denies this  . Diabetes mellitus   . Diverticular disease   . Hypertension   . Hyperlipidemia   . Coronary artery disease     a. s/p IMI 2004 tx with BMS to RCA;  b. s/p Promus DES to RCA 2/12 (cath: LM 20-30%, pLAD 20-30%, mLAD 50%, RI 30%, pRCA 60%, mRCA 90% - tx with PCI);  c. myoview 1/07: EF 49%, inf scar, no isch  . Urinary incontinence   . Insomnia   . Osteoarthritis   . Morbid obesity   . Obstructive sleep apnea   . Polymyalgia rheumatica   . Anxiety   . Arthritis   . Depression   . Cholelithiasis   . CHF (congestive heart failure)   . GERD (gastroesophageal reflux disease)   . Pulmonary emboli 9/13    felt to need lifelong anticoagulation  . Debility   . Unspecified constipation   . Allergy   . Gout, unspecified   . Dysuria   . Urinary complications   . Pain, chronic   . Allergic rhinitis due to pollen   . Osteoarthrosis, unspecified whether generalized or localized, lower leg   . Type II or unspecified type diabetes mellitus without mention of complication, uncontrolled   . Anemia, unspecified   . Coronary atherosclerosis of native coronary artery   . Other malaise and fatigue    Past Surgical History  Procedure Laterality Date  . Coronary artery bypass graft    . Appendectomy    . Cardiac catheterization    .  Gastric bypass  1977     reversed in 1979, [facility]  . Blood clots/legs and lungs  2013  . Mi with stent placement  2004   Social History:   reports that she has never smoked. She does not have any smokeless tobacco history on file. She reports that she does not drink alcohol or use illicit drugs.  Family History  Problem Relation Age of Onset  . Breast cancer Mother   . Heart disease Mother   . Throat cancer Father   . Hypertension Father   . Arthritis Father   . Diabetes Father   . Arthritis Sister   . Obesity Sister   . Diabetes Sister     Medications: Patient's Medications  New Prescriptions   No medications on file  Previous  Medications   CLONAZEPAM (KLONOPIN) 1 MG TABLET    TAKE 1 TABLET IN THE MORNING AND 2 TABLETS AT BEDTIME FOR SLEEP   COLCRYS 0.6 MG TABLET    TAKE 1 TABLET BY MOUTH EVERY DAY FOR GOUT   DOCUSATE SODIUM (COLACE) 100 MG CAPSULE    Take 1 capsule (100 mg total) by mouth 3 (three) times daily as needed for constipation.   DULOXETINE (CYMBALTA) 60 MG CAPSULE    TAKE ONE CAPSULE BY MOUTH EVERY DAY FOR ANXIETY   FUROSEMIDE (LASIX) 20 MG TABLET    TAKE 1 TABLET BY MOUTH TWICE A DAY FOR EDEMA   GLUCOSE BLOOD (FREESTYLE LITE) TEST STRIP    Check blood sugar twice daily. DX: 250.00   INSULIN GLARGINE (LANTUS SOLOSTAR) 100 UNIT/ML SOPN    Inject 76 units once daily (in 2 separate injections of 38 units each to help with insulin absorption)   INSULIN LISPRO (HUMALOG KWIKPEN) 100 UNIT/ML SOPN    Inject 10 Units into the skin 2 (two) times daily with a meal.   INSULIN PEN NEEDLE 32G X 4 MM MISC    Use with all pen devices (up to 10 needles per day)   ISOSORBIDE MONONITRATE (IMDUR) 60 MG 24 HR TABLET    Take 60 mg by mouth daily.    LANCETS (FREESTYLE) LANCETS    1 each by Other route as needed for other. Use to check diabetes twice a day Dx. 250.02   METOPROLOL SUCCINATE (TOPROL-XL) 25 MG 24 HR TABLET    TAKE 1 TABLET BY MOUTH ONCE EVERY DAY FOR BLOOD PRESSURE   MICONAZOLE (MICOTIN) 2 % CREAM    Apply 1 application topically as needed.   NITROSTAT 0.4 MG SL TABLET    TAKE 1 TABLET UNDER THE TONGUE EVERY 5 MINUTES AS NEEDED CHEST PAIN   OXYCODONE-ACETAMINOPHEN (PERCOCET) 10-325 MG PER TABLET    Take one tablet every 6 hours as needed for pain   PANTOPRAZOLE (PROTONIX) 40 MG TABLET    TAKE 1 TABLET BY MOUTH EVERY DAY   PHENYLEPHRINE-APAP-GUAIFENESIN (MUCINEX SINUS-MAX CONGESTION) 5-325-200 MG TABS    Take 1 tablet by mouth as needed.   POLYETHYLENE GLYCOL (MIRALAX / GLYCOLAX) PACKET    Take 17 g by mouth daily. Mix 17 grams in a 8 oz glass of water or juice as needed for constipation   PRAVASTATIN (PRAVACHOL) 20  MG TABLET    TAKE 1 TABLET BY MOUTH ONCE EVERY DAY FOR CHOLESTEROL   WARFARIN (COUMADIN) 5 MG TABLET    TAKE AS DIRECTED BY ANTICOAGULATION CLINIC  Modified Medications   No medications on file  Discontinued Medications   No medications on file  Physical Exam:  Filed Vitals:   06/04/13 1126  BP: 136/70  Pulse: 74  Temp: 97.1 F (36.2 C)  TempSrc: Oral  Height: 5\' 7"  (1.702 m)  Weight: 354 lb 9.6 oz (160.846 kg)  SpO2: 98%   Physical Exam  Constitutional: She is oriented to person, place, and time and well-developed, well-nourished, and in no distress. No distress.  HENT:  Head: Normocephalic and atraumatic.  Right Ear: External ear normal.  Left Ear: External ear normal.  Nose: Nose normal.  Mouth/Throat: Oropharynx is clear and moist. No oropharyngeal exudate.  Eyes: Conjunctivae and EOM are normal. Pupils are equal, round, and reactive to light.  Neck: Normal range of motion. Neck supple. No thyromegaly present.  Cardiovascular: Normal rate, regular rhythm, normal heart sounds and intact distal pulses.   Pulmonary/Chest: Effort normal and breath sounds normal. No respiratory distress.  Abdominal: Soft. Bowel sounds are normal. She exhibits no distension.  Musculoskeletal: She exhibits tenderness. She exhibits no edema.  Pain in knees and lumbar spine; uses WC  Lymphadenopathy:    She has no cervical adenopathy.  Neurological: She is alert and oriented to person, place, and time.  Skin: Skin is warm and dry. Rash (yeast to large skin folds) noted. She is not diaphoretic.  Psychiatric: Affect normal.   Labs reviewed: Basic Metabolic Panel:  Recent Labs  07/31/12 0905 01/08/13 1617 01/19/13 1520 05/12/13 1112  NA 138 133* 134* 140  K 4.7 4.5 4.7 4.3  CL 100 91* 96 100  CO2 29 27  --  26  GLUCOSE 209* 380* 418* 132*  BUN 23 17 20 27   CREATININE 1.3* 1.11* 1.40* 1.24*  CALCIUM 8.9 9.3  --  8.6   Liver Function Tests:  Recent Labs  01/08/13 1617  05/12/13 1112  AST 13 18  ALT 15 15  ALKPHOS 97 96  BILITOT 0.2 <0.2  PROT 6.8 6.8   No results found for this basename: LIPASE, AMYLASE,  in the last 8760 hours No results found for this basename: AMMONIA,  in the last 8760 hours CBC:  Recent Labs  07/31/12 0905  09/17/12 1421 01/08/13 1617 01/19/13 1520 01/19/13 1547 05/12/13 1112  WBC 10.0  < > 10.2 9.0  --  8.3 8.2  NEUTROABS 5.7  --  5.3 4.6  --   --  4.2  HGB 11.0*  --  10.6* 11.2 13.3 11.1* 10.9*  HCT 34.2*  --  33.0* 34.6 39.0 34.8* 33.1*  MCV 84.2  --  85 86  --  87.9 85  PLT 319.0  --   --   --   --  269 302  < > = values in this interval not displayed. Lipid Panel:  Recent Labs  01/08/13 1617 05/12/13 1112  HDL 42 45  LDLCALC 43 74  TRIG 170* 145  CHOLHDL 2.8 3.3   TSH: No results found for this basename: TSH,  in the last 8760 hours A1C: No components found with this basename: A1C,    Assessment/Plan 1. Osteoarthrosis, unspecified whether generalized or localized, unspecified site -conts to need pain medication frequently but this does help the pain  2. Type II or unspecified type diabetes mellitus with renal manifestations, uncontrolled -has made some lifestyle modifications but still needs to eat better and increase activity  -lab work discussed   - insulin lispro (HUMALOG KWIKPEN) 100 UNIT/ML KiwkPen; Inject 10 Units into the skin 2 (two) times daily with a meal.  Dispense: 15 pen; Refill: 1 - Insulin  Glargine (LANTUS SOLOSTAR) 100 UNIT/ML Solostar Pen; Inject 76 units once daily (in 2 separate injections of 38 units each to help with insulin absorption)  Dispense: 9 mL; Refill: 1 - NORMAL foot exam 3. HYPERTENSION -stable at this time; followed by cardiology --lab work discussed; encouraged lifestyle modifications to help with obesity, HTN, cholesterol, DM and mood pt has been eating better and still working on this; to slowly increase activity to 30-45 mins daily   - metoprolol succinate  (TOPROL-XL) 25 MG 24 hr tablet; Take 1 tablet by mouth once every day for blood pressure  Dispense: 90 tablet; Refill: 1 - isosorbide mononitrate (IMDUR) 60 MG 24 hr tablet; Take 1 tablet (60 mg total) by mouth daily.  Dispense: 90 tablet; Refill: 1  4. Chronic diastolic congestive heart failure -no signs of worsening heartfailure; conts to be followed by cardiology - isosorbide mononitrate (IMDUR) 60 MG 24 hr tablet; Take 1 tablet (60 mg total) by mouth daily.  Dispense: 90 tablet; Refill: 1  5. OSA (obstructive sleep apnea) -stable  6. GERD (gastroesophageal reflux disease) -stable on medication - pantoprazole (PROTONIX) 40 MG tablet; Take 1 tablet by mouth every day  Dispense: 90 tablet; Refill: 1  7. HYPERLIPIDEMIA -lab work discussed; encouraged lifestyle modifications to help with obesity, cholesterol, DM and mood pt has been eating better and still working on this; to slowly increase activity to 30-45 mins daily   - pravastatin (PRAVACHOL) 20 MG tablet; Take 1 tablet by mouth once every day for cholesterol  Dispense: 90 tablet; Refill: 1  8. DEPRESSIVE DISORDER -lab work discussed; encouraged lifestyle modifications to help with obesity, cholesterol, DM and mood pt has been eating better and still working on this; to slowly increase activity to 30-45 mins daily   - DULoxetine (CYMBALTA) 60 MG capsule; Take 1 capsule by mouth every day for anxiety  Dispense: 90 capsule; Refill: 1  9. Itching -due to yeast; pt with obesity and has increase skin folds; to keep area clean and dry  - nystatin (MYCOSTATIN/NYSTOP) 100000 UNIT/GM POWD; Twice daily as needed  Dispense: 60 g; Refill: 3-- to affected area  10. Gout -no recent episodes - colchicine (COLCRYS) 0.6 MG tablet; Take 1 tablet by mouth every day for gout  Dispense: 90 tablet; Refill: 1   PREVENTIVE COUNSELING:  The patient was counseled regarding the appropriate use of alcohol, regular self-examination of the breasts on a monthly  basis, prevention of dental and periodontal disease, diet, regular sustained exercise for at least 30 minutes 3-4 times per week, routine screening interval for mammogram as recommended by the [facility] and ACOG, and recommended schedule for GI hemoccult testing, colonoscopy, cholesterol, thyroid and diabetes screening. Discussed dexa scan but Pt unable to get dexa scan because she is unable to get on the table

## 2013-06-09 ENCOUNTER — Ambulatory Visit: Payer: Self-pay | Admitting: Pharmacotherapy

## 2013-06-10 ENCOUNTER — Other Ambulatory Visit: Payer: Self-pay | Admitting: *Deleted

## 2013-06-10 DIAGNOSIS — M1990 Unspecified osteoarthritis, unspecified site: Secondary | ICD-10-CM

## 2013-06-10 MED ORDER — OXYCODONE-ACETAMINOPHEN 10-325 MG PO TABS: ORAL_TABLET | ORAL | Status: DC

## 2013-06-18 ENCOUNTER — Other Ambulatory Visit: Payer: Self-pay | Admitting: *Deleted

## 2013-06-18 ENCOUNTER — Other Ambulatory Visit: Payer: Self-pay | Admitting: Internal Medicine

## 2013-06-18 MED ORDER — CLONAZEPAM 1 MG PO TABS: ORAL_TABLET | ORAL | Status: DC

## 2013-07-08 ENCOUNTER — Other Ambulatory Visit: Payer: Self-pay | Admitting: *Deleted

## 2013-07-08 DIAGNOSIS — E785 Hyperlipidemia, unspecified: Secondary | ICD-10-CM

## 2013-07-08 DIAGNOSIS — M1990 Unspecified osteoarthritis, unspecified site: Secondary | ICD-10-CM

## 2013-07-08 DIAGNOSIS — I5032 Chronic diastolic (congestive) heart failure: Secondary | ICD-10-CM

## 2013-07-08 DIAGNOSIS — F329 Major depressive disorder, single episode, unspecified: Secondary | ICD-10-CM

## 2013-07-08 DIAGNOSIS — K219 Gastro-esophageal reflux disease without esophagitis: Secondary | ICD-10-CM

## 2013-07-08 DIAGNOSIS — I10 Essential (primary) hypertension: Secondary | ICD-10-CM

## 2013-07-08 DIAGNOSIS — F3289 Other specified depressive episodes: Secondary | ICD-10-CM

## 2013-07-08 DIAGNOSIS — M109 Gout, unspecified: Secondary | ICD-10-CM

## 2013-07-08 MED ORDER — COLCHICINE 0.6 MG PO TABS: ORAL_TABLET | ORAL | Status: DC

## 2013-07-08 MED ORDER — DULOXETINE HCL 60 MG PO CPEP: ORAL_CAPSULE | ORAL | Status: DC

## 2013-07-08 MED ORDER — METOPROLOL SUCCINATE ER 25 MG PO TB24: ORAL_TABLET | ORAL | Status: DC

## 2013-07-08 MED ORDER — PRAVASTATIN SODIUM 20 MG PO TABS: ORAL_TABLET | ORAL | Status: DC

## 2013-07-08 MED ORDER — WARFARIN SODIUM 5 MG PO TABS: ORAL_TABLET | ORAL | Status: DC

## 2013-07-08 MED ORDER — ISOSORBIDE MONONITRATE ER 60 MG PO TB24: 60.0000 mg | ORAL_TABLET | Freq: Every day | ORAL | Status: DC

## 2013-07-08 MED ORDER — OXYCODONE-ACETAMINOPHEN 10-325 MG PO TABS: ORAL_TABLET | ORAL | Status: DC

## 2013-07-08 MED ORDER — FUROSEMIDE 20 MG PO TABS: ORAL_TABLET | ORAL | Status: DC

## 2013-07-08 MED ORDER — PANTOPRAZOLE SODIUM 40 MG PO TBEC: DELAYED_RELEASE_TABLET | ORAL | Status: DC

## 2013-07-08 MED ORDER — CLONAZEPAM 1 MG PO TABS
ORAL_TABLET | ORAL | Status: DC
Start: 2013-07-08 — End: 2013-08-28

## 2013-07-09 ENCOUNTER — Ambulatory Visit (INDEPENDENT_AMBULATORY_CARE_PROVIDER_SITE_OTHER): Payer: Medicare PPO | Admitting: *Deleted

## 2013-07-09 DIAGNOSIS — Z5181 Encounter for therapeutic drug level monitoring: Secondary | ICD-10-CM

## 2013-07-09 DIAGNOSIS — I2699 Other pulmonary embolism without acute cor pulmonale: Secondary | ICD-10-CM

## 2013-07-09 DIAGNOSIS — Z7901 Long term (current) use of anticoagulants: Secondary | ICD-10-CM

## 2013-07-09 LAB — POCT INR: INR: 3.8

## 2013-07-10 ENCOUNTER — Telehealth: Payer: Self-pay | Admitting: *Deleted

## 2013-07-10 NOTE — Telephone Encounter (Signed)
appt scheduled on 07/29/13 @ 12:00 with Dr Bubba Camp.

## 2013-07-10 NOTE — Telephone Encounter (Signed)
I have not seen this patient since august 2014. I must have cosigned a form with Jessica. If patient still wants a diabetic shoes, I will need a more current detailed foot exam to be able to assess for the need.

## 2013-07-10 NOTE — Telephone Encounter (Signed)
Spoke to pt regarding the new form for diabetic shoes ([employer]).   Form was filled out by [name] on 06/04/13 stating her findings from foot exam.  She didn't qualified but was filled out. Then you signed a letter on 06/11/13, that stated she didn't qualify for diabetic shoes or inserts at present review.   Please advise on rather or not you still want an appt with her? Thanks [employer]

## 2013-07-28 ENCOUNTER — Ambulatory Visit (INDEPENDENT_AMBULATORY_CARE_PROVIDER_SITE_OTHER): Payer: Medicare PPO | Admitting: Pharmacist

## 2013-07-28 DIAGNOSIS — Z7901 Long term (current) use of anticoagulants: Secondary | ICD-10-CM

## 2013-07-28 DIAGNOSIS — Z5181 Encounter for therapeutic drug level monitoring: Secondary | ICD-10-CM

## 2013-07-28 DIAGNOSIS — I2699 Other pulmonary embolism without acute cor pulmonale: Secondary | ICD-10-CM

## 2013-07-28 LAB — POCT INR: INR: 2.8

## 2013-07-29 ENCOUNTER — Ambulatory Visit (INDEPENDENT_AMBULATORY_CARE_PROVIDER_SITE_OTHER): Payer: Medicare PPO | Admitting: Internal Medicine

## 2013-07-29 ENCOUNTER — Encounter: Payer: Self-pay | Admitting: Internal Medicine

## 2013-07-29 VITALS — BP 150/82 | HR 84 | Temp 98.2°F | Resp 20 | Ht 67.0 in | Wt 356.2 lb

## 2013-07-29 DIAGNOSIS — E1165 Type 2 diabetes mellitus with hyperglycemia: Principal | ICD-10-CM

## 2013-07-29 DIAGNOSIS — E1129 Type 2 diabetes mellitus with other diabetic kidney complication: Secondary | ICD-10-CM

## 2013-07-29 DIAGNOSIS — M21969 Unspecified acquired deformity of unspecified lower leg: Secondary | ICD-10-CM

## 2013-07-29 DIAGNOSIS — E1169 Type 2 diabetes mellitus with other specified complication: Secondary | ICD-10-CM | POA: Insufficient documentation

## 2013-07-29 NOTE — Progress Notes (Signed)
Patient ID: [name], female   DOB: [birth year], 79 y.o.   MRN: [id number]    Chief Complaint  Patient presents with  . Acute Visit    diabetic shoes   Allergies  Allergen Reactions  . Sulfonamide Derivatives Swelling    To mouth    HPI 79 y/o female pt with uncontrolled DM is seen today for assessment for diabetic shoes. She has the form with her Her cbg has been ranging between 98-300 at present. Last a1c of 11.7 No recent falls reported. Pt is mostly on her wheelchair and moves around with her walker. Denies any sores/ callus in her feet Has morbid obesity  Review of Systems  Constitutional: Positive for malaise/fatigue. Negative for fever, chills and diaphoresis.  HENT: Negative for congestion, sore throat and tinnitus.   Eyes: Negative for blurred vision.  Respiratory: Negative for cough and shortness of breath.   Cardiovascular: Negative for chest pain, palpitations, claudication and leg swelling.  Gastrointestinal: Negative for heartburn, nausea, vomiting, abdominal pain and diarrhea.  Genitourinary: Negative for dysuria and frequency.  Musculoskeletal: Positive for joint pain. Negative for myalgias and falls.  Skin: Negative for rash.  Neurological: Negative for dizziness, seizures, weakness and headaches.  Psychiatric/Behavioral: Negative for depression and memory loss. The patient is not nervous/anxious and does not have insomnia.      Past Medical History  Diagnosis Date  . COPD (chronic obstructive pulmonary disease)     patient denies this  . Diabetes mellitus   . Diverticular disease   . Hypertension   . Hyperlipidemia   . Coronary artery disease     a. s/p IMI 2004 tx with BMS to RCA;  b. s/p Promus DES to RCA 2/12 (cath: LM 20-30%, pLAD 20-30%, mLAD 50%, RI 30%, pRCA 60%, mRCA 90% - tx with PCI);  c. myoview 1/07: EF 49%, inf scar, no isch  . Urinary incontinence   . Insomnia   . Osteoarthritis   . Morbid obesity   . Obstructive sleep apnea   .  Polymyalgia rheumatica   . Anxiety   . Arthritis   . Depression   . Cholelithiasis   . CHF (congestive heart failure)   . GERD (gastroesophageal reflux disease)   . Pulmonary emboli 9/13    felt to need lifelong anticoagulation  . Debility   . Unspecified constipation   . Allergy   . Gout, unspecified   . Dysuria   . Urinary complications   . Pain, chronic   . Allergic rhinitis due to pollen   . Osteoarthrosis, unspecified whether generalized or localized, lower leg   . Type II or unspecified type diabetes mellitus without mention of complication, uncontrolled   . Anemia, unspecified   . Coronary atherosclerosis of native coronary artery   . Other malaise and fatigue    Current Outpatient Prescriptions on File Prior to Visit  Medication Sig Dispense Refill  . clonazePAM (KLONOPIN) 1 MG tablet Take one tablet in the morning and two tablets at bedtime for sleep  90 tablet  3  . colchicine (COLCRYS) 0.6 MG tablet Take 1 tablet by mouth every day for gout  90 tablet  3  . docusate sodium (COLACE) 100 MG capsule Take 1 capsule (100 mg total) by mouth 3 (three) times daily as needed for constipation.  90 capsule  5  . DULoxetine (CYMBALTA) 60 MG capsule Take 1 capsule by mouth every day for anxiety  90 capsule  3  . furosemide (LASIX) 20 MG  tablet Take one tablet by mouth twice daily for edema  180 tablet  3  . glucose blood (FREESTYLE LITE) test strip Check blood sugar twice daily. DX: 250.00  100 each  12  . Insulin Glargine (LANTUS SOLOSTAR) 100 UNIT/ML Solostar Pen Inject 76 units once daily (in 2 separate injections of 38 units each to help with insulin absorption)  9 mL  1  . insulin lispro (HUMALOG KWIKPEN) 100 UNIT/ML KiwkPen Inject 10 Units into the skin 2 (two) times daily with a meal.  15 pen  1  . Insulin Pen Needle 32G X 4 MM MISC Use with all pen devices (up to 10 needles per day)  300 each  4  . isosorbide mononitrate (IMDUR) 60 MG 24 hr tablet Take 1 tablet (60 mg total)  by mouth daily.  90 tablet  3  . Lancets (FREESTYLE) lancets 1 each by Other route as needed for other. Use to check diabetes twice a day Dx. 250.02      . metoprolol succinate (TOPROL-XL) 25 MG 24 hr tablet Take 1 tablet by mouth once every day for blood pressure  90 tablet  3  . miconazole (MICOTIN) 2 % cream Apply 1 application topically as needed.      [name] NITROSTAT 0.4 MG SL tablet TAKE 1 TABLET UNDER THE TONGUE EVERY 5 MINUTES AS NEEDED CHEST PAIN  50 tablet  0  . nystatin (MYCOSTATIN/NYSTOP) 100000 UNIT/GM POWD Twice daily as needed  60 g  3  . oxyCODONE-acetaminophen (PERCOCET) 10-325 MG per tablet Take one tablet every 6 hours as needed for pain  120 tablet  0  . pantoprazole (PROTONIX) 40 MG tablet Take 1 tablet by mouth every day  90 tablet  3  . Phenylephrine-APAP-Guaifenesin (MUCINEX SINUS-MAX CONGESTION) 5-325-200 MG TABS Take 1 tablet by mouth as needed.      . polyethylene glycol (MIRALAX / GLYCOLAX) packet Take 17 g by mouth daily. Mix 17 grams in a 8 oz glass of water or juice as needed for constipation      . pravastatin (PRAVACHOL) 20 MG tablet Take 1 tablet by mouth once every day for cholesterol  90 tablet  3  . warfarin (COUMADIN) 5 MG tablet TAKE AS DIRECTED BY ANTICOAGULATION CLINIC  45 tablet  0   No current facility-administered medications on file prior to visit.   Past Surgical History  Procedure Laterality Date  . Coronary artery bypass graft    . Appendectomy    . Cardiac catheterization    . Gastric bypass  1977     reversed in 1979, [facility]  . Blood clots/legs and lungs  2013  . Mi with stent placement  2004    Physical exam BP 150/82  Pulse 84  Temp(Src) 98.2 F (36.8 C) (Oral)  Resp 20  Ht 5\' 7"  (1.702 m)  Wt 356 lb 3.2 oz (161.571 kg)  BMI 55.78 kg/m2  SpO2 96%  General- elderly female in no acute distress, morbidly obese Head- atraumatic, normocephalic Mouth- normal mucus membrane, no oral thrush, normal oropharynx Cardiovascular-  normal s1,s2, no murmurs/ rubs/ gallops, normal distal pulses Respiratory- bilateral clear to auscultation, no wheeze, no rhonchi, no crackles Abdomen- bowel sounds present, soft, non tender Musculoskeletal- on wheelchair, limited mobility, has charcot foot deformity on right foot, trace leg edema Neurological- normal sensation to fine touch and vibration, no open sores, no calluses Skin- warm and dry Psychiatry- alert and oriented to person, place and time, normal mood and affect  Labs-  Lab Results  Component Value Date   HGBA1C 11.7* 05/12/2013   1. Type II or unspecified type diabetes mellitus with renal manifestations, uncontrolled(250.42) Recent a1c 11.7. Monitor cbg and continue a1c monitoring. Continue levemir and novolog for now as plan of care for diabetes.  2. Type 2 diabetes mellitus with diabetic foot deformity i am treating this patient under comprehensive plan of care for diabetes. Given her foot deformity, she will benefit from diabetic footwear to protect her feet. Prevent trauma and continue foot care  Filled out the form for [employer] and will fax it

## 2013-08-11 ENCOUNTER — Encounter: Payer: Self-pay | Admitting: Pharmacotherapy

## 2013-08-11 ENCOUNTER — Ambulatory Visit (INDEPENDENT_AMBULATORY_CARE_PROVIDER_SITE_OTHER): Payer: Medicare PPO | Admitting: Pharmacotherapy

## 2013-08-11 VITALS — BP 124/76 | HR 76 | Resp 10

## 2013-08-11 DIAGNOSIS — N183 Chronic kidney disease, stage 3 unspecified: Secondary | ICD-10-CM

## 2013-08-11 DIAGNOSIS — E1129 Type 2 diabetes mellitus with other diabetic kidney complication: Secondary | ICD-10-CM

## 2013-08-11 DIAGNOSIS — M1990 Unspecified osteoarthritis, unspecified site: Secondary | ICD-10-CM

## 2013-08-11 DIAGNOSIS — I10 Essential (primary) hypertension: Secondary | ICD-10-CM

## 2013-08-11 DIAGNOSIS — E1165 Type 2 diabetes mellitus with hyperglycemia: Secondary | ICD-10-CM

## 2013-08-11 MED ORDER — INSULIN GLARGINE 100 UNIT/ML SOLOSTAR PEN: PEN_INJECTOR | SUBCUTANEOUS | Status: DC

## 2013-08-11 MED ORDER — OXYCODONE-ACETAMINOPHEN 10-325 MG PO TABS: ORAL_TABLET | ORAL | Status: DC

## 2013-08-11 MED ORDER — INSULIN GLARGINE 100 UNIT/ML SOLOSTAR PEN
PEN_INJECTOR | SUBCUTANEOUS | Status: DC
Start: 2013-08-11 — End: 2013-08-13

## 2013-08-11 NOTE — Addendum Note (Signed)
Addended by: Logan Bores on: 08/11/2013 11:51 AM   Modules accepted: Orders

## 2013-08-11 NOTE — Progress Notes (Signed)
  Subjective:    [name] is a 79 y.o.African American female who presents for follow-up of Type 2 diabetes mellitus.  No labs since January 2015.  A1C 11.7% Refused to weight today. Is complaining of right foot pain for 2-3 weeks.  NO signs of infection.  Not hot to touch.  No redness or edema.  She did not bring blood glucose meter. Reports BG:  140-200 range.  Only checking fasting.  She says she is taking the Novolog / Humalog 16 units.  Admits to missing 1 dose per week. She reports Levemir / Lantus at 76 units daily.  No exercise. Nocturia 4 times per night. Does have peripheral edema bilaterally.   Review of Systems A comprehensive review of systems was negative except for: Cardiovascular: positive for lower extremity edema Genitourinary: positive for nocturia Musculoskeletal: positive for arthralgias    Objective:    BP 124/76  Pulse 76  Resp 10  SpO2 96%  General:  alert, cooperative, no distress and morbidly obese  Oropharynx: normal findings: lips normal without lesions and gums healthy   Eyes:  negative findings: lids and lashes normal and conjunctivae and sclerae normal   Ears:  external ears normal        Lung: clear to auscultation bilaterally  Heart:  regular rate and rhythm     Extremities: edema bilaterally in feet and hands  Skin: dry  Pulses: LE pulses good  Neuro: mental status, speech normal, alert and oriented x3 and using a wheelchair   Lab Review Glucose (mg/dL)  Date Value  05/12/2013 132*  01/08/2013 380*     Glucose, Bld (mg/dL)  Date Value  01/19/2013 418*  07/31/2012 209*  02/01/2012 167*     CO2 (mmol/L)  Date Value  05/12/2013 26   01/08/2013 27   07/31/2012 29      BUN (mg/dL)  Date Value  05/12/2013 27   01/19/2013 20   01/08/2013 17   07/31/2012 23   05/13/2012 28*  02/01/2012 16      Creat (mg/dL)  Date Value  10/21/2010 1.33*     Creatinine (mg/dL)  Date Value  05/13/2012 1.4*     Creatinine, Ser (mg/dL)  Date  Value  05/12/2013 1.24*  01/19/2013 1.40*  01/08/2013 1.11*   A1C and CMP today    Assessment:    Diabetes Mellitus type II, under poor control.  BP at goal <140/90   Plan:    1.  Rx changes: Increase Lantus 80 units daily 2.  Continue Humalog 16 units with meals. 3.  Not a candidate for SGLT-2 due to CKD. 4.  Counseled at length on need to improve nutrition and exercise.  Explained that she needs a balance of RX, nutrition and exercise to control her DM. 5.  Exercise goal is 30-45 minutes 5 x week. 6.  HTN at goal <140/90.  Continue Metoprolol. 7.  Continue pravastatin for dyslipidemia. 8.  Toe pain likely due to peripheral neuropathy.  Explained that poorly controlled DM is making this worse.

## 2013-08-11 NOTE — Addendum Note (Signed)
Addended by: Logan Bores on: 08/11/2013 11:46 AM   Modules accepted: Orders

## 2013-08-11 NOTE — Patient Instructions (Signed)
Increase Lantus to 80 units daily.

## 2013-08-12 LAB — HEMOGLOBIN A1C
Est. average glucose Bld gHb Est-mCnc: 272 mg/dL
Hgb A1c MFr Bld: 11.1 % — ABNORMAL HIGH (ref 4.8–5.6)

## 2013-08-12 LAB — COMPREHENSIVE METABOLIC PANEL
ALT: 14 IU/L (ref 0–32)
AST: 16 IU/L (ref 0–40)
Albumin/Globulin Ratio: 0.9 — ABNORMAL LOW (ref 1.1–2.5)
Albumin: 3.5 g/dL (ref 3.5–4.8)
Alkaline Phosphatase: 98 IU/L (ref 39–117)
BUN/Creatinine Ratio: 14 (ref 11–26)
BUN: 20 mg/dL (ref 8–27)
CO2: 25 mmol/L (ref 18–29)
Calcium: 8.8 mg/dL (ref 8.7–10.3)
Chloride: 96 mmol/L — ABNORMAL LOW (ref 97–108)
Creatinine, Ser: 1.4 mg/dL — ABNORMAL HIGH (ref 0.57–1.00)
GFR calc Af Amer: 42 mL/min/{1.73_m2} — ABNORMAL LOW (ref >59)
GFR calc non Af Amer: 37 mL/min/{1.73_m2} — ABNORMAL LOW (ref >59)
Globulin, Total: 3.7 g/dL (ref 1.5–4.5)
Glucose: 209 mg/dL — ABNORMAL HIGH (ref 65–99)
Potassium: 5.1 mmol/L (ref 3.5–5.2)
Sodium: 136 mmol/L (ref 134–144)
Total Bilirubin: 0.2 mg/dL (ref 0.0–1.2)
Total Protein: 7.2 g/dL (ref 6.0–8.5)

## 2013-08-13 ENCOUNTER — Other Ambulatory Visit: Payer: Self-pay | Admitting: *Deleted

## 2013-08-13 DIAGNOSIS — E1129 Type 2 diabetes mellitus with other diabetic kidney complication: Secondary | ICD-10-CM

## 2013-08-13 DIAGNOSIS — E1165 Type 2 diabetes mellitus with hyperglycemia: Principal | ICD-10-CM

## 2013-08-13 MED ORDER — INSULIN GLARGINE 100 UNIT/ML SOLOSTAR PEN: PEN_INJECTOR | SUBCUTANEOUS | Status: DC

## 2013-08-13 NOTE — Telephone Encounter (Signed)
[employer]

## 2013-08-25 ENCOUNTER — Ambulatory Visit (INDEPENDENT_AMBULATORY_CARE_PROVIDER_SITE_OTHER): Payer: Medicare PPO | Admitting: Pharmacist

## 2013-08-25 DIAGNOSIS — I2699 Other pulmonary embolism without acute cor pulmonale: Secondary | ICD-10-CM

## 2013-08-25 DIAGNOSIS — Z5181 Encounter for therapeutic drug level monitoring: Secondary | ICD-10-CM

## 2013-08-25 DIAGNOSIS — Z7901 Long term (current) use of anticoagulants: Secondary | ICD-10-CM

## 2013-08-25 LAB — POCT INR: INR: 3.6

## 2013-08-26 ENCOUNTER — Other Ambulatory Visit: Payer: Self-pay | Admitting: Internal Medicine

## 2013-08-28 ENCOUNTER — Other Ambulatory Visit: Payer: Self-pay | Admitting: Internal Medicine

## 2013-08-28 MED ORDER — CLONAZEPAM 1 MG PO TABS: ORAL_TABLET | ORAL | Status: DC

## 2013-08-28 NOTE — Telephone Encounter (Signed)
Patient requested to be faxed to [employer]

## 2013-09-02 ENCOUNTER — Other Ambulatory Visit: Payer: Self-pay | Admitting: *Deleted

## 2013-09-02 MED ORDER — CLONAZEPAM 1 MG PO TABS: ORAL_TABLET | ORAL | Status: DC

## 2013-09-02 NOTE — Telephone Encounter (Signed)
[employer]

## 2013-09-09 ENCOUNTER — Ambulatory Visit: Payer: Medicare PPO

## 2013-09-09 ENCOUNTER — Ambulatory Visit (INDEPENDENT_AMBULATORY_CARE_PROVIDER_SITE_OTHER): Payer: Medicare PPO | Admitting: Pharmacist Clinician (PhC)/ Clinical Pharmacy Specialist

## 2013-09-09 DIAGNOSIS — Z7901 Long term (current) use of anticoagulants: Secondary | ICD-10-CM

## 2013-09-09 DIAGNOSIS — Z5181 Encounter for therapeutic drug level monitoring: Secondary | ICD-10-CM

## 2013-09-09 DIAGNOSIS — I2699 Other pulmonary embolism without acute cor pulmonale: Secondary | ICD-10-CM

## 2013-09-09 LAB — POCT INR: INR: 3.4

## 2013-09-10 ENCOUNTER — Other Ambulatory Visit: Payer: Self-pay | Admitting: *Deleted

## 2013-09-10 DIAGNOSIS — M1990 Unspecified osteoarthritis, unspecified site: Secondary | ICD-10-CM

## 2013-09-10 MED ORDER — OXYCODONE-ACETAMINOPHEN 10-325 MG PO TABS
ORAL_TABLET | ORAL | Status: DC
Start: 2013-09-10 — End: 2013-10-13

## 2013-09-23 ENCOUNTER — Ambulatory Visit (INDEPENDENT_AMBULATORY_CARE_PROVIDER_SITE_OTHER): Payer: Medicare PPO

## 2013-09-23 ENCOUNTER — Ambulatory Visit (INDEPENDENT_AMBULATORY_CARE_PROVIDER_SITE_OTHER): Payer: Medicare PPO | Admitting: *Deleted

## 2013-09-23 VITALS — BP 137/74 | HR 80 | Resp 18 | Ht 67.0 in | Wt 350.0 lb

## 2013-09-23 DIAGNOSIS — Z7901 Long term (current) use of anticoagulants: Secondary | ICD-10-CM

## 2013-09-23 DIAGNOSIS — E1169 Type 2 diabetes mellitus with other specified complication: Secondary | ICD-10-CM

## 2013-09-23 DIAGNOSIS — Z5181 Encounter for therapeutic drug level monitoring: Secondary | ICD-10-CM

## 2013-09-23 DIAGNOSIS — M21969 Unspecified acquired deformity of unspecified lower leg: Secondary | ICD-10-CM

## 2013-09-23 DIAGNOSIS — B351 Tinea unguium: Secondary | ICD-10-CM

## 2013-09-23 DIAGNOSIS — I2699 Other pulmonary embolism without acute cor pulmonale: Secondary | ICD-10-CM

## 2013-09-23 DIAGNOSIS — M79609 Pain in unspecified limb: Secondary | ICD-10-CM

## 2013-09-23 DIAGNOSIS — E1149 Type 2 diabetes mellitus with other diabetic neurological complication: Secondary | ICD-10-CM

## 2013-09-23 DIAGNOSIS — E1142 Type 2 diabetes mellitus with diabetic polyneuropathy: Secondary | ICD-10-CM

## 2013-09-23 DIAGNOSIS — M2040 Other hammer toe(s) (acquired), unspecified foot: Secondary | ICD-10-CM

## 2013-09-23 DIAGNOSIS — E1140 Type 2 diabetes mellitus with diabetic neuropathy, unspecified: Secondary | ICD-10-CM

## 2013-09-23 LAB — POCT INR: INR: 2.6

## 2013-09-23 NOTE — Progress Notes (Signed)
   Subjective:    Patient ID: [name], female    DOB: July 25, 1934, 79 y.o.   MRN: [id number]  HPI Comments: N debridement L 1 - 10 toenails D and O long-term C elongated, encurvated toenails A diabetes, difficult to cut T none     Review of Systems  All other systems reviewed and are negative.      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 Patient been promised her right knee recently injured knee.  Lower extremity objective findings as follows vascular status is intact pedal pulses are diminished with pedal DP plus one over 4 bilateral PT zero over four bilateral +1 edema noted is decreased epicritic sensation Semmes Weinstein testing to the digits forefoot and arch. Normal plantar response DTRs not elicited dermatologically skin color pigment normal hair growth absent nails criptotic incurvated 1 through 5 left 2 through 5 right nails thickened and medial border the left hallux having had previous nail procedure. Of right hallux it has been. The nails ingrowing criptotic incurvated and friable as indicated 2 through 5 bilateral and first left. No history of injury or trauma patient wearing flip-flops which is appropriate for her she needs to wear shoes and socks at all times is advised appropriate diabetic foot and nail care literature dispensed for the patient this time. No open wounds ulcerations no active infection is noted at this time. Patient does have paresthesia decreased epicritic sensation confirmed       Assessment & Plan:  Assessment diabetes with peripheral neuropathy and angiopathy decreased epicritic and proprioceptive sensations basket status rigid digital contractures are noted patient indicates any extra depth shoes we'll obtain authorization for shoes and follow purple in the future palliative care in 3  months as recommended nails debrided 1 through 5 left 2 through 5 right and the nail spicule on the right hallux also debrided recommend appropriate shoes obtain authorization for diabetic shoes  Harriet Masson DPM

## 2013-09-23 NOTE — Patient Instructions (Signed)
Diabetes and Foot Care Diabetes may cause you to have problems because of poor blood supply (circulation) to your feet and legs. This may cause the skin on your feet to become thinner, break easier, and heal more slowly. Your skin may become dry, and the skin may peel and crack. You may also have nerve damage in your legs and feet causing decreased feeling in them. You may not notice minor injuries to your feet that could lead to infections or more serious problems. Taking care of your feet is one of the most important things you can do for yourself.  HOME CARE INSTRUCTIONS  Wear shoes at all times, even in the house. Do not go barefoot. Bare feet are easily injured.  Check your feet daily for blisters, cuts, and redness. If you cannot see the bottom of your feet, use a mirror or ask someone for help.  Wash your feet with warm water (do not use hot water) and mild soap. Then pat your feet and the areas between your toes until they are completely dry. Do not soak your feet as this can dry your skin.  Apply a moisturizing lotion or petroleum jelly (that does not contain alcohol and is unscented) to the skin on your feet and to dry, brittle toenails. Do not apply lotion between your toes.  Trim your toenails straight across. Do not dig under them or around the cuticle. File the edges of your nails with an emery board or nail file.  Do not cut corns or calluses or try to remove them with medicine.  Wear clean socks or stockings every day. Make sure they are not too tight. Do not wear knee-high stockings since they may decrease blood flow to your legs.  Wear shoes that fit properly and have enough cushioning. To break in new shoes, wear them for just a few hours a day. This prevents you from injuring your feet. Always look in your shoes before you put them on to be sure there are no objects inside.  Do not cross your legs. This may decrease the blood flow to your feet.  If you find a minor scrape,  cut, or break in the skin on your feet, keep it and the skin around it clean and dry. These areas may be cleansed with mild soap and water. Do not cleanse the area with peroxide, alcohol, or iodine.  When you remove an adhesive bandage, be sure not to damage the skin around it.  If you have a wound, look at it several times a day to make sure it is healing.  Do not use heating pads or hot water bottles. They may burn your skin. If you have lost feeling in your feet or legs, you may not know it is happening until it is too late.  Make sure your health care provider performs a complete foot exam at least annually or more often if you have foot problems. Report any cuts, sores, or bruises to your health care provider immediately. SEEK MEDICAL CARE IF:   You have an injury that is not healing.  You have cuts or breaks in the skin.  You have an ingrown nail.  You notice redness on your legs or feet.  You feel burning or tingling in your legs or feet.  You have pain or cramps in your legs and feet.  Your legs or feet are numb.  Your feet always feel cold. SEEK IMMEDIATE MEDICAL CARE IF:   There is increasing redness,   swelling, or pain in or around a wound.  There is a red line that goes up your leg.  Pus is coming from a wound.  You develop a fever or as directed by your health care provider.  You notice a bad smell coming from an ulcer or wound. Document Released: 04/21/2000 Document Revised: 12/25/2012 Document Reviewed: 10/01/2012 Exit[employer] Patient Information 2014 [employer].  

## 2013-09-24 ENCOUNTER — Other Ambulatory Visit: Payer: Self-pay | Admitting: *Deleted

## 2013-09-24 DIAGNOSIS — K219 Gastro-esophageal reflux disease without esophagitis: Secondary | ICD-10-CM

## 2013-09-24 DIAGNOSIS — E1129 Type 2 diabetes mellitus with other diabetic kidney complication: Secondary | ICD-10-CM

## 2013-09-24 DIAGNOSIS — E1165 Type 2 diabetes mellitus with hyperglycemia: Secondary | ICD-10-CM

## 2013-09-24 DIAGNOSIS — M109 Gout, unspecified: Secondary | ICD-10-CM

## 2013-09-24 DIAGNOSIS — I10 Essential (primary) hypertension: Secondary | ICD-10-CM

## 2013-09-24 DIAGNOSIS — F3289 Other specified depressive episodes: Secondary | ICD-10-CM

## 2013-09-24 DIAGNOSIS — E785 Hyperlipidemia, unspecified: Secondary | ICD-10-CM

## 2013-09-24 DIAGNOSIS — F329 Major depressive disorder, single episode, unspecified: Secondary | ICD-10-CM

## 2013-09-24 DIAGNOSIS — I5032 Chronic diastolic (congestive) heart failure: Secondary | ICD-10-CM

## 2013-09-24 MED ORDER — WARFARIN SODIUM 5 MG PO TABS: ORAL_TABLET | ORAL | Status: DC

## 2013-09-24 MED ORDER — ISOSORBIDE MONONITRATE ER 60 MG PO TB24: 60.0000 mg | ORAL_TABLET | Freq: Every day | ORAL | Status: DC

## 2013-09-24 MED ORDER — DULOXETINE HCL 60 MG PO CPEP: ORAL_CAPSULE | ORAL | Status: DC

## 2013-09-24 MED ORDER — NITROGLYCERIN 0.4 MG SL SUBL: SUBLINGUAL_TABLET | SUBLINGUAL | Status: DC

## 2013-09-24 MED ORDER — DOCUSATE SODIUM 100 MG PO CAPS: 100.0000 mg | ORAL_CAPSULE | Freq: Three times a day (TID) | ORAL | Status: DC | PRN

## 2013-09-24 MED ORDER — CLONAZEPAM 1 MG PO TABS: ORAL_TABLET | ORAL | Status: DC

## 2013-09-24 MED ORDER — COLCHICINE 0.6 MG PO TABS: ORAL_TABLET | ORAL | Status: DC

## 2013-09-24 MED ORDER — FUROSEMIDE 20 MG PO TABS: ORAL_TABLET | ORAL | Status: DC

## 2013-09-24 MED ORDER — FREESTYLE LANCETS MISC: Status: DC

## 2013-09-24 MED ORDER — MICONAZOLE NITRATE 2 % EX CREA: 1.0000 "application " | TOPICAL_CREAM | CUTANEOUS | Status: DC | PRN

## 2013-09-24 MED ORDER — METOPROLOL SUCCINATE ER 25 MG PO TB24: ORAL_TABLET | ORAL | Status: DC

## 2013-09-24 MED ORDER — INSULIN LISPRO 100 UNIT/ML (KWIKPEN): 10.0000 [IU] | PEN_INJECTOR | Freq: Two times a day (BID) | SUBCUTANEOUS | Status: DC

## 2013-09-24 MED ORDER — GLUCOSE BLOOD VI STRP: ORAL_STRIP | Status: DC

## 2013-09-24 MED ORDER — INSULIN PEN NEEDLE 32G X 4 MM MISC: Status: DC

## 2013-09-24 MED ORDER — PRAVASTATIN SODIUM 20 MG PO TABS: ORAL_TABLET | ORAL | Status: DC

## 2013-09-24 MED ORDER — PANTOPRAZOLE SODIUM 40 MG PO TBEC: DELAYED_RELEASE_TABLET | ORAL | Status: DC

## 2013-09-24 MED ORDER — INSULIN GLARGINE 100 UNIT/ML SOLOSTAR PEN: PEN_INJECTOR | SUBCUTANEOUS | Status: DC

## 2013-09-24 NOTE — Telephone Encounter (Signed)
Patient came into office and stated that Mail In for her Rx's is not working and would like for all of them to be sent to [facility] on [address]. E-Prescribed all medications to pharmacy

## 2013-10-03 NOTE — Progress Notes (Unsigned)
Called patient to set up an appointment to be measured for diabetic shoes. Per patient she has already purchased shoes elsewhere. I asked patient if she purchased them at another [facility] office and she said no. I explained to her that these shoes were custom made for her and couldn't be purchased in a retail store. Patient stated she wasn't interested in coming to be measured at this time since she already has purchased shoes.[id number]

## 2013-10-13 ENCOUNTER — Other Ambulatory Visit: Payer: Self-pay | Admitting: *Deleted

## 2013-10-13 DIAGNOSIS — M1990 Unspecified osteoarthritis, unspecified site: Secondary | ICD-10-CM

## 2013-10-13 MED ORDER — OXYCODONE-ACETAMINOPHEN 10-325 MG PO TABS: ORAL_TABLET | ORAL | Status: DC

## 2013-10-14 ENCOUNTER — Ambulatory Visit (INDEPENDENT_AMBULATORY_CARE_PROVIDER_SITE_OTHER): Payer: Medicare PPO | Admitting: *Deleted

## 2013-10-14 ENCOUNTER — Ambulatory Visit (INDEPENDENT_AMBULATORY_CARE_PROVIDER_SITE_OTHER): Payer: Medicare PPO | Admitting: Cardiology

## 2013-10-14 ENCOUNTER — Encounter (HOSPITAL_COMMUNITY): Payer: Self-pay | Admitting: Emergency Medicine

## 2013-10-14 ENCOUNTER — Emergency Department (HOSPITAL_COMMUNITY): Payer: Medicare PPO

## 2013-10-14 ENCOUNTER — Encounter: Payer: Self-pay | Admitting: Cardiology

## 2013-10-14 ENCOUNTER — Emergency Department (HOSPITAL_COMMUNITY)
Admission: EM | Admit: 2013-10-14 | Discharge: 2013-10-14 | Disposition: A | Discharge status: Home / Self Care | Payer: Medicare PPO | Attending: Emergency Medicine | Admitting: Emergency Medicine

## 2013-10-14 VITALS — BP 152/78 | HR 73 | Ht 67.0 in | Wt 357.0 lb

## 2013-10-14 DIAGNOSIS — Z8739 Personal history of other diseases of the musculoskeletal system and connective tissue: Secondary | ICD-10-CM | POA: Insufficient documentation

## 2013-10-14 DIAGNOSIS — Z9861 Coronary angioplasty status: Secondary | ICD-10-CM | POA: Insufficient documentation

## 2013-10-14 DIAGNOSIS — Z7901 Long term (current) use of anticoagulants: Secondary | ICD-10-CM | POA: Insufficient documentation

## 2013-10-14 DIAGNOSIS — J449 Chronic obstructive pulmonary disease, unspecified: Secondary | ICD-10-CM

## 2013-10-14 DIAGNOSIS — R0989 Other specified symptoms and signs involving the circulatory and respiratory systems: Secondary | ICD-10-CM

## 2013-10-14 DIAGNOSIS — E1169 Type 2 diabetes mellitus with other specified complication: Secondary | ICD-10-CM

## 2013-10-14 DIAGNOSIS — F329 Major depressive disorder, single episode, unspecified: Secondary | ICD-10-CM | POA: Insufficient documentation

## 2013-10-14 DIAGNOSIS — Z79899 Other long term (current) drug therapy: Secondary | ICD-10-CM | POA: Insufficient documentation

## 2013-10-14 DIAGNOSIS — G4733 Obstructive sleep apnea (adult) (pediatric): Secondary | ICD-10-CM

## 2013-10-14 DIAGNOSIS — M109 Gout, unspecified: Secondary | ICD-10-CM | POA: Insufficient documentation

## 2013-10-14 DIAGNOSIS — E1165 Type 2 diabetes mellitus with hyperglycemia: Secondary | ICD-10-CM

## 2013-10-14 DIAGNOSIS — Z951 Presence of aortocoronary bypass graft: Secondary | ICD-10-CM | POA: Insufficient documentation

## 2013-10-14 DIAGNOSIS — I509 Heart failure, unspecified: Secondary | ICD-10-CM | POA: Insufficient documentation

## 2013-10-14 DIAGNOSIS — R0601 Orthopnea: Secondary | ICD-10-CM

## 2013-10-14 DIAGNOSIS — F411 Generalized anxiety disorder: Secondary | ICD-10-CM | POA: Insufficient documentation

## 2013-10-14 DIAGNOSIS — R0609 Other forms of dyspnea: Secondary | ICD-10-CM

## 2013-10-14 DIAGNOSIS — K219 Gastro-esophageal reflux disease without esophagitis: Secondary | ICD-10-CM | POA: Insufficient documentation

## 2013-10-14 DIAGNOSIS — I2510 Atherosclerotic heart disease of native coronary artery without angina pectoris: Secondary | ICD-10-CM | POA: Insufficient documentation

## 2013-10-14 DIAGNOSIS — IMO0002 Reserved for concepts with insufficient information to code with codable children: Secondary | ICD-10-CM | POA: Insufficient documentation

## 2013-10-14 DIAGNOSIS — R0600 Dyspnea, unspecified: Secondary | ICD-10-CM

## 2013-10-14 DIAGNOSIS — E669 Obesity, unspecified: Secondary | ICD-10-CM | POA: Insufficient documentation

## 2013-10-14 DIAGNOSIS — Z5181 Encounter for therapeutic drug level monitoring: Secondary | ICD-10-CM

## 2013-10-14 DIAGNOSIS — Z9889 Other specified postprocedural states: Secondary | ICD-10-CM | POA: Insufficient documentation

## 2013-10-14 DIAGNOSIS — Z8669 Personal history of other diseases of the nervous system and sense organs: Secondary | ICD-10-CM | POA: Insufficient documentation

## 2013-10-14 DIAGNOSIS — I10 Essential (primary) hypertension: Secondary | ICD-10-CM | POA: Insufficient documentation

## 2013-10-14 DIAGNOSIS — R079 Chest pain, unspecified: Secondary | ICD-10-CM

## 2013-10-14 DIAGNOSIS — E785 Hyperlipidemia, unspecified: Secondary | ICD-10-CM | POA: Insufficient documentation

## 2013-10-14 DIAGNOSIS — F3289 Other specified depressive episodes: Secondary | ICD-10-CM | POA: Insufficient documentation

## 2013-10-14 DIAGNOSIS — J441 Chronic obstructive pulmonary disease with (acute) exacerbation: Secondary | ICD-10-CM | POA: Insufficient documentation

## 2013-10-14 DIAGNOSIS — Z862 Personal history of diseases of the blood and blood-forming organs and certain disorders involving the immune mechanism: Secondary | ICD-10-CM | POA: Insufficient documentation

## 2013-10-14 DIAGNOSIS — I2699 Other pulmonary embolism without acute cor pulmonale: Secondary | ICD-10-CM

## 2013-10-14 DIAGNOSIS — G8929 Other chronic pain: Secondary | ICD-10-CM | POA: Insufficient documentation

## 2013-10-14 DIAGNOSIS — IMO0001 Reserved for inherently not codable concepts without codable children: Secondary | ICD-10-CM | POA: Insufficient documentation

## 2013-10-14 DIAGNOSIS — Z86711 Personal history of pulmonary embolism: Secondary | ICD-10-CM | POA: Insufficient documentation

## 2013-10-14 DIAGNOSIS — M21969 Unspecified acquired deformity of unspecified lower leg: Secondary | ICD-10-CM

## 2013-10-14 LAB — BASIC METABOLIC PANEL
BUN: 20 mg/dL (ref 6–23)
CO2: 26 mEq/L (ref 19–32)
Calcium: 9.1 mg/dL (ref 8.4–10.5)
Chloride: 100 mEq/L (ref 96–112)
Creatinine, Ser: 1.15 mg/dL — ABNORMAL HIGH (ref 0.50–1.10)
GFR calc Af Amer: 52 mL/min — ABNORMAL LOW (ref >90)
GFR calc non Af Amer: 45 mL/min — ABNORMAL LOW (ref >90)
Glucose, Bld: 325 mg/dL — ABNORMAL HIGH (ref 70–99)
Potassium: 4.7 mEq/L (ref 3.7–5.3)
Sodium: 139 mEq/L (ref 137–147)

## 2013-10-14 LAB — CBC
HCT: 35.1 % — ABNORMAL LOW (ref 36.0–46.0)
Hemoglobin: 10.7 g/dL — ABNORMAL LOW (ref 12.0–15.0)
MCH: 26.3 pg (ref 26.0–34.0)
MCHC: 30.5 g/dL (ref 30.0–36.0)
MCV: 86.2 fL (ref 78.0–100.0)
Platelets: 299 10*3/uL (ref 150–400)
RBC: 4.07 MIL/uL (ref 3.87–5.11)
RDW: 14.8 % (ref 11.5–15.5)
WBC: 10 10*3/uL (ref 4.0–10.5)

## 2013-10-14 LAB — I-STAT ARTERIAL BLOOD GAS, ED
Bicarbonate: 23.7 mEq/L (ref 20.0–24.0)
O2 Saturation: 96 %
TCO2: 25 mmol/L (ref 0–100)
pCO2 arterial: 36.7 mmHg (ref 35.0–45.0)
pH, Arterial: 7.419 (ref 7.350–7.450)
pO2, Arterial: 77 mmHg — ABNORMAL LOW (ref 80.0–100.0)

## 2013-10-14 LAB — PRO B NATRIURETIC PEPTIDE: Pro B Natriuretic peptide (BNP): 150.2 pg/mL (ref 0–450)

## 2013-10-14 LAB — POCT INR: INR: 2.3

## 2013-10-14 LAB — I-STAT TROPONIN, ED: Troponin i, poc: 0 ng/mL (ref 0.00–0.08)

## 2013-10-14 NOTE — ED Notes (Signed)
Pt presents with ongoing SOB x2 weeks that worsened last night, pt seen at her Cardiology; Dr. Meda Coffee. Pt sent over here for further evaluation and to possibly have "fluid drained." Pt denies chest pain, nausea, vomiting, diaphoresis. Pt does admit to productive cough with green/clear colored sputum and pain to bilateral ear.

## 2013-10-14 NOTE — Consult Note (Signed)
CONSULTATION NOTE  Reason for Consult: Shortness of breath  Requesting Physician: Dr. Roderic Palau  Cardiologist: Dr. Meda Coffee  HPI: This is a 79 y.o. female with a past medical history significant for CAD with past inferior MI in 2004. She has had bare-metal stent to the RCA in 2004 and drug-eluting stent placement to the RCA in 06/2010 in the setting of unstable angina with overall preserved LV function. Last cath in 2012 showing otherwise nonobstructive disease. Her other problems include super morbid obesity, severe OA - basically confined to a wheelchair, COPD, DM, HTN, HLD, incontinence, OSA, polymyalgia rheumatica, anxiety, depression and gallbladder disease. She is on chronic anticoagulation for PE since September of 2013. Echo last September 2013 was a TDS but with a normal EF noted.  The patient was seen in April 2014 by Truitt Merle after a ER visit in [place] for chest pain and medical management was decided. The patient has been doing overall well, with rare episodes of chest pain. In December 2014 she again went to the ER in [place] for chest pain and based on patient's report a cath was performed with patent stent and otherwise non-obstructive CAD. One more episode of CP since then that resolved quickly after sl NTG.  She is complaining of lack of sleep, needs CPAP, but cant afford it. She is morbidly obese and bed ridden other than walking to the bathroom. She drinks a lot of soda and lately has been feeling really thirsty and has been drinking an excessive amount of water in the last few days.   The patient came into the office today for Coumadin check up and stated pharmacist that she has been feeling progressively short of breath, with orthopnea and paroxysmal nocturnal dyspnea that got significantly worse the last night and she spent the rest of the night sitting upright. She states that today she is still significantly short of breath and just feels very tired. She is still not  using her CPAP she states that she can't afford it.  She was referred to the ER for evaluation.  In the ER her studies are quite unremarkable. She has a room air O2 saturation of 100%. CXR was notable for low volume, but no acute process. EKG shows NSR without ischemic changes. She became dyspneic with sitting up to examine her lungs - she reports wheezing at night, which is likely intermittent upper airway obstruction. She has recently had a mildly productive cough as well.  No fever or chills.  PMHx:  Past Medical History  Diagnosis Date  . COPD (chronic obstructive pulmonary disease)     patient denies this  . Diabetes mellitus   . Diverticular disease   . Hypertension   . Hyperlipidemia   . Coronary artery disease     a. s/p IMI 2004 tx with BMS to RCA;  b. s/p Promus DES to RCA 2/12 (cath: LM 20-30%, pLAD 20-30%, mLAD 50%, RI 30%, pRCA 60%, mRCA 90% - tx with PCI);  c. myoview 1/07: EF 49%, inf scar, no isch  . Urinary incontinence   . Insomnia   . Osteoarthritis   . Morbid obesity   . Obstructive sleep apnea   . Polymyalgia rheumatica   . Anxiety   . Arthritis   . Depression   . Cholelithiasis   . CHF (congestive heart failure)   . GERD (gastroesophageal reflux disease)   . Pulmonary emboli 9/13    felt to need lifelong anticoagulation  . Debility   .  Unspecified constipation   . Allergy   . Gout, unspecified   . Dysuria   . Urinary complications   . Pain, chronic   . Allergic rhinitis due to pollen   . Osteoarthrosis, unspecified whether generalized or localized, lower leg   . Type II or unspecified type diabetes mellitus without mention of complication, uncontrolled   . Anemia, unspecified   . Coronary atherosclerosis of native coronary artery   . Other malaise and fatigue    Past Surgical History  Procedure Laterality Date  . Coronary artery bypass graft    . Appendectomy    . Cardiac catheterization    . Gastric bypass  1977     reversed in 1979, [facility]  . Blood clots/legs and lungs  2013  . Mi with stent placement  2004    FAMHx: Family History  Problem Relation Age of Onset  . Breast cancer Mother   . Heart disease Mother   . Throat cancer Father   . Hypertension Father   . Arthritis Father   . Diabetes Father   . Arthritis Sister   . Obesity Sister   . Diabetes Sister     SOCHx:  reports that she has never smoked. She does not have any smokeless tobacco history on file. She reports that she does not drink alcohol or use illicit drugs.  ALLERGIES: Allergies  Allergen Reactions  . Sulfonamide Derivatives Swelling    To mouth    ROS: A comprehensive review of systems was negative except for: Constitutional: positive for fatigue Respiratory: positive for cough and dyspnea on exertion Cardiovascular: positive for orthopnea and paroxysmal nocturnal dyspnea  HOME MEDICATIONS:   Medication List    ASK your doctor about these medications       clonazePAM 1 MG tablet  Commonly known as:  KLONOPIN  Take one tablet in the morning and two tablets at bedtime for sleep     colchicine 0.6 MG tablet  Commonly known as:  COLCRYS  Take 1 tablet by mouth every day for gout     diphenhydrAMINE 25 MG tablet  Commonly known as:  BENADRYL  Take 50 mg by mouth at bedtime as needed for itching.     docusate sodium 100 MG capsule  Commonly known as:  COLACE  Take 100 mg by mouth 3 (three) times daily as needed for mild constipation.     DULoxetine 60 MG capsule  Commonly known as:  CYMBALTA  Take 1 capsule by mouth every day for anxiety     EX-LAX PO  Take 2 tablets by mouth every other day.     FIBER FORMULA PO  Take 3 tablets by mouth daily. gummy     furosemide 20 MG tablet  Commonly known as:  LASIX  Take one tablet by mouth twice daily for edema     HUMALOG KWIKPEN 100 UNIT/ML KiwkPen  Generic drug:  insulin lispro  Inject 16 Units into the skin 2 (two) times daily after a meal.     hydrocortisone  cream 1 %  Apply 1 application topically daily as needed for itching.     Insulin Glargine 100 UNIT/ML Solostar Pen  Commonly known as:  LANTUS SOLOSTAR  Inject 80 units once daily (in 2 separate injections of 40 units each to help with insulin absorption)     isosorbide mononitrate 60 MG 24 hr tablet  Commonly known as:  IMDUR  Take 1 tablet (60 mg total) by mouth daily.  metoprolol succinate 25 MG 24 hr tablet  Commonly known as:  TOPROL-XL  Take 1 tablet by mouth once every day for blood pressure     MUCINEX SINUS-MAX CONGESTION 5-325-200 MG Tabs  Generic drug:  Phenylephrine-APAP-Guaifenesin  Take 1 tablet by mouth daily as needed (congestion).     nitroGLYCERIN 0.4 MG SL tablet  Commonly known as:  NITROSTAT  Take one tablet under the tongue every 5 minutes as needed for chest pain     oxyCODONE-acetaminophen 10-325 MG per tablet  Commonly known as:  PERCOCET  Take one tablet every 6 hours as needed for pain     pantoprazole 40 MG tablet  Commonly known as:  PROTONIX  Take 1 tablet by mouth every day     pravastatin 20 MG tablet  Commonly known as:  PRAVACHOL  Take 1 tablet by mouth once every day for cholesterol     warfarin 5 MG tablet  Commonly known as:  COUMADIN  Take 2.5-5 mg by mouth daily. Take 2.3m on Tuesday and Thursdays.  All other days take 556m      HOSPITAL MEDICATIONS: I have reviewed the patient's current medications.  VITALS: Blood pressure 157/63, pulse 70, temperature 97.5 F (36.4 C), temperature source Oral, resp. rate 14, height _0  (1.702 m), weight 357 lb (161.934 kg), SpO2 100.00%.  PHYSICAL EXAM: General appearance: alert, mild distress and morbidly obese Neck: no carotid bruit and thick, unable to assess JVP Lungs: diminished breath sounds bilaterally and clear at apices Heart: regular rate and rhythm Abdomen: super morbidly obese Extremities: extremities normal, atraumatic, no cyanosis or edema Pulses: 2+ and  symmetric Skin: Skin color, texture, turgor normal. No rashes or lesions Neurologic: Grossly normal Psych: Normal  LABS: Results for orders placed during the hospital encounter of 10/14/13 (from the past 48 hour(s))  CBC     Status: Abnormal   Collection Time    10/14/13  4:44 PM      Result Value Ref Range   WBC 10.0  4.0 - 10.5 K/uL   RBC 4.07  3.87 - 5.11 MIL/uL   Hemoglobin 10.7 (*) 12.0 - 15.0 g/dL   HCT 35.1 (*) 36.0 - 46.0 %   MCV 86.2  78.0 - 100.0 fL   MCH 26.3  26.0 - 34.0 pg   MCHC 30.5  30.0 - 36.0 g/dL   RDW 14.8  11.5 - 15.5 %   Platelets 299  150 - 400 K/uL  BASIC METABOLIC PANEL     Status: Abnormal   Collection Time    10/14/13  4:44 PM      Result Value Ref Range   Sodium 139  137 - 147 mEq/L   Potassium 4.7  3.7 - 5.3 mEq/L   Chloride 100  96 - 112 mEq/L   CO2 26  19 - 32 mEq/L   Glucose, Bld 325 (*) 70 - 99 mg/dL   BUN 20  6 - 23 mg/dL   Creatinine, Ser 1.15 (*) 0.50 - 1.10 mg/dL   Calcium 9.1  8.4 - 10.5 mg/dL   GFR calc non Af Amer 45 (*) >90 mL/min   GFR calc Af Amer 52 (*) >90 mL/min   Comment: (NOTE)     The eGFR has been calculated using the CKD EPI equation.     This calculation has not been validated in all clinical situations.     eGFR's persistently <90 mL/min signify possible Chronic Kidney     Disease.  PRO  B NATRIURETIC PEPTIDE     Status: None   Collection Time    10/14/13  4:44 PM      Result Value Ref Range   Pro B Natriuretic peptide (BNP) 150.2  0 - 450 pg/mL  I-STAT TROPOININ, ED     Status: None   Collection Time    10/14/13  4:59 PM      Result Value Ref Range   Troponin i, poc 0.00  0.00 - 0.08 ng/mL   Comment 3            Comment: Due to the release kinetics of cTnI,     a negative result within the first hours     of the onset of symptoms does not rule out     myocardial infarction with certainty.     If myocardial infarction is still suspected,     repeat the test at appropriate intervals.  I-STAT ARTERIAL BLOOD GAS,  ED     Status: Abnormal   Collection Time    10/14/13  6:36 PM      Result Value Ref Range   pH, Arterial 7.419  7.350 - 7.450   pCO2 arterial 36.7  35.0 - 45.0 mmHg   pO2, Arterial 77.0 (*) 80.0 - 100.0 mmHg   Bicarbonate 23.7  20.0 - 24.0 mEq/L   TCO2 25  0 - 100 mmol/L   O2 Saturation 96.0     Collection site RADIAL, ALLEN'S TEST ACCEPTABLE     Drawn by Operator     Sample type ARTERIAL      IMAGING: Dg Chest 2 View  10/14/2013   CLINICAL DATA:  Shortness of breath, hypertension, diabetes, CHF  EXAM: CHEST  2 VIEW  COMPARISON:  01/25/2012  FINDINGS: Normal heart size and vascularity. No CHF or pneumonia. Negative for effusion or pneumothorax. Trachea is midline. Degenerative changes of the shoulders and spine.  IMPRESSION: Low volume exam.  No acute process.   Electronically Signed   By: Daryll Brod M.D.   On: 10/14/2013 17:34    HOSPITAL DIAGNOSES: Active Problems:   * No active hospital problems. *   IMPRESSION: 1. Orthopnea related to morbid obesity 2. Obstructive sleep apnea - untreated 3. Uncontrolled diabetes  RECOMMENDATION: 1. I suspect her orthopnea is related to super morbid obesity - she is short of breath with minimal exertion (i.e., just sitting up). She complains of wheeze, but her lungs are clear. CXR shows no acute process. Wheezing is upper airway and worse lying down - suggesting partial upper airway obstruction. ABG does not show signs of chronic retention. BNP is 150, troponin is negative. I do not appreciate an acute cardiac process that would necessitate admission.  She will need to work with a case manager to try to get her CPAP paid for as this is life-saving. She may benefit from noctural oxygen in the short-term.   Thanks for consulting [place]. Would recommend follow-up with Dr. Meda Coffee.  Time Spent Directly with Patient: 30 minutes  Pixie Casino, MD, [facility] Attending Cardiologist [place] 10/14/2013, 7:08 PM

## 2013-10-14 NOTE — Patient Instructions (Addendum)
Your physician recommends that you continue on your current medications as directed. Please refer to the Current Medication list given to you today.  PER DR NELSON YOU NEED TO FILE FOR HARDSHIP TO RECEIVE YOUR CPAP THAT WAS PREVIOUSLY ORDERED BY DR PANDEY  GO TO [place] FOR FURTHER WORK-UP FOR CP AND SOB PER DR NELSON-CARD MASTER [place] WILL BE NOTIFIED OF YOUR ARRIVAL

## 2013-10-14 NOTE — Discharge Instructions (Signed)
Follow up with dr. Meda Coffee as needed

## 2013-10-14 NOTE — Progress Notes (Signed)
Patient ID: [name], female   DOB: [birth year], 79 y.o.   MRN: [id number]    History of Present Illness: [name] is seen back today for a work in visit. She was seen by  Dr. [name] in the past. She has multiple issues which include known CAD with past inferior MI in 2004. She has had bare-metal stent to the RCA in 2004 and drug-eluting stent placement to the RCA in 06/2010 in the setting of unstable angina with overall preserved LV function.  Last cath in 2012 showing otherwise nonobstructive disease. Her other problems include morbid obesity, severe OA - basically confined to a wheelchair, COPD, DM, HTN, HLD, incontinence, OSA, polymyalgia rheumatica, anxiety, depression and gallbladder disease. She is on chronic anticoagulation for PE since September of 2013. Echo last September 2013 was a TDS but with a normal EF noted.   The patient was seen in April 2014 by Truitt Merle after a ER visit in [place] for chest pain and medical management was decided. The patient has been doing overall well, with rare episodes of chest pain. In December 2014 she again went to the ER in [place] for chest pain and based on patient's report a cath was performed with patent stent and otherwise non-obstructive CAD. One more episode of CP since then that resolved quickly after sl NTG.   She is complaining of lack of sleep, needs CPAP, but cant afford it. She is morbidly obese and bed ridden other than walking to the bathroom. She drinks a lot of soda and lately has been feeling really thirsty and has been drinking an excessive amount of water in the last few days.  The patient came today for Coumadin check up and stated pharmacist that she has been feeling progressively short of breath, with orthopnea and paroxysmal nocturnal dyspnea that got significantly worse the last night and she spent the rest of the night sitting upright. She states that today she is still significantly short of breath and just feels very tired.  She is still not using her CPAP she states that she can't afford it.   Current Outpatient Prescriptions on File Prior to Visit  Medication Sig Dispense Refill  . clonazePAM (KLONOPIN) 1 MG tablet Take one tablet in the morning and two tablets at bedtime for sleep  90 tablet  3  . colchicine (COLCRYS) 0.6 MG tablet Take 1 tablet by mouth every day for gout  30 tablet  3  . docusate sodium (COLACE) 100 MG capsule Take 1 capsule (100 mg total) by mouth 3 (three) times daily as needed.  90 capsule  3  . DULoxetine (CYMBALTA) 60 MG capsule Take 1 capsule by mouth every day for anxiety  30 capsule  3  . furosemide (LASIX) 20 MG tablet Take one tablet by mouth twice daily for edema  60 tablet  3  . glucose blood (FREESTYLE LITE) test strip Check blood sugar twice daily. DX: 250.00  100 each  12  . Insulin Glargine (LANTUS SOLOSTAR) 100 UNIT/ML Solostar Pen Inject 80 units once daily (in 2 separate injections of 40 units each to help with insulin absorption)  10 pen  4  . insulin lispro (HUMALOG KWIKPEN) 100 UNIT/ML KiwkPen Inject 0.1 mLs (10 Units total) into the skin 2 (two) times daily with a meal.  15 pen  1  . Insulin Pen Needle 32G X 4 MM MISC Use with all pen devices (up to 10 needles per day)  300 each  4  . isosorbide mononitrate (IMDUR) 60 MG 24 hr tablet Take 1 tablet (60 mg total) by mouth daily.  30 tablet  3  . Lancets (FREESTYLE) lancets Use to check diabetes twice a day Dx. 250.02  100 each  12  . metoprolol succinate (TOPROL-XL) 25 MG 24 hr tablet Take 1 tablet by mouth once every day for blood pressure  30 tablet  3  . miconazole (MICOTIN) 2 % cream Apply 1 application topically as needed.  28.35 g  1  . nitroGLYCERIN (NITROSTAT) 0.4 MG SL tablet Take one tablet under the tongue every 5 minutes as needed for chest pain  50 tablet  0  . nystatin (MYCOSTATIN/NYSTOP) 100000 UNIT/GM POWD Twice daily as needed  60 g  3  . oxyCODONE-acetaminophen (PERCOCET) 10-325 MG per tablet Take one  tablet every 6 hours as needed for pain  120 tablet  0  . pantoprazole (PROTONIX) 40 MG tablet Take 1 tablet by mouth every day  30 tablet  3  . Phenylephrine-APAP-Guaifenesin (MUCINEX SINUS-MAX CONGESTION) 5-325-200 MG TABS Take 1 tablet by mouth as needed.      . polyethylene glycol (MIRALAX / GLYCOLAX) packet Take 17 g by mouth daily. Mix 17 grams in a 8 oz glass of water or juice as needed for constipation      . pravastatin (PRAVACHOL) 20 MG tablet Take 1 tablet by mouth once every day for cholesterol  30 tablet  3  . warfarin (COUMADIN) 5 MG tablet Take as Directed by Anticoagulation clinic  30 tablet  3   No current facility-administered medications on file prior to visit.    Allergies  Allergen Reactions  . Sulfonamide Derivatives Swelling    To mouth    Past Medical History  Diagnosis Date  . COPD (chronic obstructive pulmonary disease)     patient denies this  . Diabetes mellitus   . Diverticular disease   . Hypertension   . Hyperlipidemia   . Coronary artery disease     a. s/p IMI 2004 tx with BMS to RCA;  b. s/p Promus DES to RCA 2/12 (cath: LM 20-30%, pLAD 20-30%, mLAD 50%, RI 30%, pRCA 60%, mRCA 90% - tx with PCI);  c. myoview 1/07: EF 49%, inf scar, no isch  . Urinary incontinence   . Insomnia   . Osteoarthritis   . Morbid obesity   . Obstructive sleep apnea   . Polymyalgia rheumatica   . Anxiety   . Arthritis   . Depression   . Cholelithiasis   . CHF (congestive heart failure)   . GERD (gastroesophageal reflux disease)   . Pulmonary emboli 9/13    felt to need lifelong anticoagulation  . Debility   . Unspecified constipation   . Allergy   . Gout, unspecified   . Dysuria   . Urinary complications   . Pain, chronic   . Allergic rhinitis due to pollen   . Osteoarthrosis, unspecified whether generalized or localized, lower leg   . Type II or unspecified type diabetes mellitus without mention of complication, uncontrolled   . Anemia, unspecified   .  Coronary atherosclerosis of native coronary artery   . Other malaise and fatigue     Past Surgical History  Procedure Laterality Date  . Coronary artery bypass graft    . Appendectomy    . Cardiac catheterization    . Gastric bypass  1977     reversed in [facility], [facility]  . Blood clots/legs and lungs  2013  . Mi with stent placement  2004    History  Smoking status  . Never Smoker   Smokeless tobacco  . Not on file    History  Alcohol Use No    Family History  Problem Relation Age of Onset  . Breast cancer Mother   . Heart disease Mother   . Throat cancer Father   . Hypertension Father   . Arthritis Father   . Diabetes Father   . Arthritis Sister   . Obesity Sister   . Diabetes Sister     Review of Systems: The review of systems is per the HPI.  All other systems were reviewed and are negative.  Physical Exam: BP 152/78  Pulse 73  Ht 5\' 7"  (1.702 m)  Wt 357 lb (161.934 kg)  BMI 55.90 kg/m2 Patient is alert and in no acute distress. She is morbidly obese. She is not able to stand to weigh. She reports her weight to be 319. She is in a wheelchair. Skin is warm and dry. Color is normal.  HEENT is unremarkable. Normocephalic/atraumatic. PERRL. Sclera are nonicteric. Neck is supple. No masses. No JVD. Lungs are fairly clear. Cardiac exam shows a regular rate and rhythm. Heart tones are quite distant.  Abdomen is obese but soft. Extremities with mild B/L edema. Gait is not tested. ROM appears intact. No gross neurologic deficits noted.  LABORATORY DATA:  EKG today shows inferior MI, incomplete RBBB. No acute changes.   Lab Results  Component Value Date   WBC 8.2 05/12/2013   HGB 10.9* 05/12/2013   HCT 33.1* 05/12/2013   PLT 302 05/12/2013   GLUCOSE 209* 08/11/2013   CHOL 136 05/13/2012   TRIG 145 05/12/2013   HDL 45 05/12/2013   LDLCALC 74 05/12/2013   ALT 14 08/11/2013   AST 16 08/11/2013   NA 136 08/11/2013   K 5.1 08/11/2013   CL 96* 08/11/2013   CREATININE 1.40* 08/11/2013    BUN 20 08/11/2013   CO2 25 08/11/2013   TSH 0.785 01/26/2012   INR 2.3 10/14/2013   HGBA1C 11.1* 08/11/2013    Lab Results  Component Value Date   INR 2.3 10/14/2013   INR 2.6 09/23/2013   INR 3.4 09/09/2013    CORONARY ANGIOGRAPHY from Feb. 2012  1. The left mainstem is calcified. There is 20% to 30% distal left  main stenosis.  2. LAD: The LAD has mild diffuse stenosis. There is moderate  calcification present. The vessel gives off two diagonal branches.  The second is the larger branch. The proximal LAD has 20% to 30%  stenosis. The mid LAD has 50% stenosis.  3. Left circumflex: The left circumflex is also calcified. The  vessel is patent throughout. There is nonobstructive stenosis in  the ramus intermedius which is the main branch vessel of the  circumflex. This has approximately 30% to 40% stenosis.  4. Right coronary artery. The right coronary artery is a large,  dominant vessel. There is a 60% proximal stenosis. The mid vessel  has an eccentric 90% lesion which is markedly progressed from the  previous study. The PDA and posterolateral branches are both  widely patent. There is moderate calcification throughout the  right coronary artery.  5. Left ventriculography shows normal LV function. The ejection  fraction is estimated at 55%.   ASSESSMENT:  1. Severe right coronary artery stenosis with successful percutaneous  intervention using a long drug-eluting stent in the mid vessel and  a 16-mm  drug-eluting stents in the proximal vessel.  2. Nonobstructive left main, left anterior descending, and left  circumflex stenoses.  3. Normal left ventricular function.   RECOMMENDATIONS: The patient should be continued on dual antiplatelettherapy with aspirin and prasugrel for a minimum of 12 months.  Juanda Bond. Burt Knack, MD  MDC/MEDQ D: 06/22/2010 T: 06/23/2010 Job: [id number]    Echo Study Conclusions from September 2013  - Left ventricle: Difficult study. LV wall motion is good. I do  not see an obvious wall motion abnormality. The cavity size was normal. Wall thickness was increased in a pattern of mild LVH. The estimated ejection fraction was 60%. Regional wall motion abnormalities cannot be excluded. - Right ventricle: The cavity size was moderately dilated. Systolic function was moderately reduced. RV systolic pressure is probably in the 8mmHg range. - Right atrium: The atrium was mildly dilated.   Assessment / Plan:  1. Acute on Chronic diastolic CHF - patient presented with worsening orthopnea and paroxysmal nocturnal dyspnea and states that she still feels significantly short of breath even though she doesn't seem uncomfortable. She doesn't have significant lower extremity edema and she might possibly have crackles but because of her size it's difficult to perform physical examination. Because patient is refusing to go home stating that she feels significantly short of breath we will send her to the emergency room for chest x-ray and aggressive diuresis. There is a good chance she is fluid overloaded as she is very noncompliant to her diet and fluid restriction. We will try to apply for hardship for her CPAP machine and she claims that she can't afford it.  2. Chest pain - with past hospitalization in December 2014, cath in [place] with patent stent and non-obstructive CAD, we will continue medical management. We will obtain records from [facility]  3. Morbid obesity - this is the crux of her issues. She is advised to start walking, stop drinking soda.  4. OSA - tested and prescribed CPAP, that she cant afford, she would really benefit from it. Advised to save money over several months.  5. HTN - elevated, we will recheck at the hospital,   6. DVT/PE - on coumadin - therapeutic  [name] 10/14/2013

## 2013-10-15 NOTE — ED Provider Notes (Signed)
CSN: [id number]     Arrival date & time 10/14/13  1625 History   First MD Initiated Contact with Patient 10/14/13 1714     Chief Complaint  Patient presents with  . Shortness of Breath     (Consider location/radiation/quality/duration/timing/severity/associated sxs/prior Treatment) Patient is a 79 y.o. female presenting with shortness of breath. The history is provided by the patient (the pt complains of sob.  she was seen by cardiology and sent to er for evaluation).  Shortness of Breath Severity:  Moderate Onset quality:  Gradual Timing:  Constant Progression:  Unchanged Chronicity:  Recurrent Context: activity   Associated symptoms: no abdominal pain, no chest pain, no cough, no headaches and no rash     Past Medical History  Diagnosis Date  . COPD (chronic obstructive pulmonary disease)     patient denies this  . Diabetes mellitus   . Diverticular disease   . Hypertension   . Hyperlipidemia   . Coronary artery disease     a. s/p IMI 2004 tx with BMS to RCA;  b. s/p Promus DES to RCA 2/12 (cath: LM 20-30%, pLAD 20-30%, mLAD 50%, RI 30%, pRCA 60%, mRCA 90% - tx with PCI);  c. myoview 1/07: EF 49%, inf scar, no isch  . Urinary incontinence   . Insomnia   . Osteoarthritis   . Morbid obesity   . Obstructive sleep apnea   . Polymyalgia rheumatica   . Anxiety   . Arthritis   . Depression   . Cholelithiasis   . CHF (congestive heart failure)   . GERD (gastroesophageal reflux disease)   . Pulmonary emboli 9/13    felt to need lifelong anticoagulation  . Debility   . Unspecified constipation   . Allergy   . Gout, unspecified   . Dysuria   . Urinary complications   . Pain, chronic   . Allergic rhinitis due to pollen   . Osteoarthrosis, unspecified whether generalized or localized, lower leg   . Type II or unspecified type diabetes mellitus without mention of complication, uncontrolled   . Anemia, unspecified   . Coronary atherosclerosis of native coronary artery   .  Other malaise and fatigue    Past Surgical History  Procedure Laterality Date  . Coronary artery bypass graft    . Appendectomy    . Cardiac catheterization    . Gastric bypass  1977     reversed in 1979, [facility]  . Blood clots/legs and lungs  2013  . Mi with stent placement  2004   Family History  Problem Relation Age of Onset  . Breast cancer Mother   . Heart disease Mother   . Throat cancer Father   . Hypertension Father   . Arthritis Father   . Diabetes Father   . Arthritis Sister   . Obesity Sister   . Diabetes Sister    History  Substance Use Topics  . Smoking status: Never Smoker   . Smokeless tobacco: Not on file  . Alcohol Use: No   OB History   Grav Para Term Preterm Abortions TAB SAB Ect Mult Living                 Review of Systems  Constitutional: Negative for appetite change and fatigue.  HENT: Negative for congestion, ear discharge and sinus pressure.   Eyes: Negative for discharge.  Respiratory: Positive for shortness of breath. Negative for cough.   Cardiovascular: Negative for chest pain.  Gastrointestinal: Negative for abdominal  pain and diarrhea.  Genitourinary: Negative for frequency and hematuria.  Musculoskeletal: Negative for back pain.  Skin: Negative for rash.  Neurological: Negative for seizures and headaches.  Psychiatric/Behavioral: Negative for hallucinations.      Allergies  Sulfonamide derivatives  Home Medications   Prior to Admission medications   Medication Sig Start Date End Date Taking? Authorizing Provider  clonazePAM (KLONOPIN) 1 MG tablet Take one tablet in the morning and two tablets at bedtime for sleep 09/24/13  Yes Pricilla Larsson, NP  colchicine (COLCRYS) 0.6 MG tablet Take 1 tablet by mouth every day for gout 09/24/13  Yes Pricilla Larsson, NP  diphenhydrAMINE (BENADRYL) 25 MG tablet Take 50 mg by mouth at bedtime as needed for itching.   Yes Historical Provider, MD  docusate sodium (COLACE) 100 MG capsule  Take 100 mg by mouth 3 (three) times daily as needed for mild constipation.   Yes Historical Provider, MD  DULoxetine (CYMBALTA) 60 MG capsule Take 1 capsule by mouth every day for anxiety 09/24/13  Yes Pricilla Larsson, NP  FIBER FORMULA PO Take 3 tablets by mouth daily. gummy   Yes Historical Provider, MD  furosemide (LASIX) 20 MG tablet Take one tablet by mouth twice daily for edema 09/24/13  Yes Pricilla Larsson, NP  hydrocortisone cream 1 % Apply 1 application topically daily as needed for itching.   Yes Historical Provider, MD  Insulin Glargine (LANTUS SOLOSTAR) 100 UNIT/ML Solostar Pen Inject 80 units once daily (in 2 separate injections of 40 units each to help with insulin absorption) 09/24/13  Yes Pricilla Larsson, NP  insulin lispro (HUMALOG KWIKPEN) 100 UNIT/ML KiwkPen Inject 16 Units into the skin 2 (two) times daily after a meal.   Yes Historical Provider, MD  isosorbide mononitrate (IMDUR) 60 MG 24 hr tablet Take 1 tablet (60 mg total) by mouth daily. 09/24/13  Yes Pricilla Larsson, NP  metoprolol succinate (TOPROL-XL) 25 MG 24 hr tablet Take 1 tablet by mouth once every day for blood pressure 09/24/13  Yes Pricilla Larsson, NP  nitroGLYCERIN (NITROSTAT) 0.4 MG SL tablet Take one tablet under the tongue every 5 minutes as needed for chest pain 09/24/13  Yes Pricilla Larsson, NP  oxyCODONE-acetaminophen (PERCOCET) 10-325 MG per tablet Take one tablet every 6 hours as needed for pain 10/13/13  Yes Tiffany L Reed, DO  pantoprazole (PROTONIX) 40 MG tablet Take 1 tablet by mouth every day 09/24/13  Yes Pricilla Larsson, NP  Phenylephrine-APAP-Guaifenesin (MUCINEX SINUS-MAX CONGESTION) 5-325-200 MG TABS Take 1 tablet by mouth daily as needed (congestion).    Yes Historical Provider, MD  pravastatin (PRAVACHOL) 20 MG tablet Take 1 tablet by mouth once every day for cholesterol 09/24/13  Yes Pricilla Larsson, NP  Sennosides (EX-LAX PO) Take 2 tablets by mouth every other day.   Yes Historical Provider, MD   warfarin (COUMADIN) 5 MG tablet Take 2.5-5 mg by mouth daily. Take 2.5mg  on Tuesday and Thursdays.  All other days take 5mg    Yes Historical Provider, MD   BP 157/63  Pulse 73  Temp(Src) 97.5 F (36.4 C) (Oral)  Resp 16  Ht 5\' 7"  (1.702 m)  Wt 357 lb (161.934 kg)  BMI 55.90 kg/m2  SpO2 100% Physical Exam  Constitutional: She is oriented to person, place, and time.  Pt is obese  HENT:  Head: Normocephalic.  Eyes: Conjunctivae and EOM are normal. No scleral icterus.  Neck: Neck supple. No thyromegaly present.  Cardiovascular: Normal  rate and regular rhythm.  Exam reveals no gallop and no friction rub.   No murmur heard. Pulmonary/Chest: No stridor. She has no wheezes. She has no rales. She exhibits no tenderness.  Abdominal: She exhibits no distension. There is no tenderness. There is no rebound.  Musculoskeletal: Normal range of motion. She exhibits no edema.  Lymphadenopathy:    She has no cervical adenopathy.  Neurological: She is oriented to person, place, and time. She exhibits normal muscle tone. Coordination normal.  Skin: No rash noted. No erythema.  Psychiatric: She has a normal mood and affect. Her behavior is normal.    ED Course  Procedures (including critical care time) Labs Review Labs Reviewed  CBC - Abnormal; Notable for the following:    Hemoglobin 10.7 (*)    HCT 35.1 (*)    All other components within normal limits  BASIC METABOLIC PANEL - Abnormal; Notable for the following:    Glucose, Bld 325 (*)    Creatinine, Ser 1.15 (*)    GFR calc non Af Amer 45 (*)    GFR calc Af Amer 52 (*)    All other components within normal limits  I-STAT ARTERIAL BLOOD GAS, ED - Abnormal; Notable for the following:    pO2, Arterial 77.0 (*)    All other components within normal limits  PRO B NATRIURETIC PEPTIDE  I-STAT TROPOININ, ED    Imaging Review Dg Chest 2 View  10/14/2013   CLINICAL DATA:  Shortness of breath, hypertension, diabetes, CHF  EXAM: CHEST  2 VIEW   COMPARISON:  01/25/2012  FINDINGS: Normal heart size and vascularity. No CHF or pneumonia. Negative for effusion or pneumothorax. Trachea is midline. Degenerative changes of the shoulders and spine.  IMPRESSION: Low volume exam.  No acute process.   Electronically Signed   By: Daryll Brod M.D.   On: 10/14/2013 17:34     EKG Interpretation   Date/Time:  Tuesday October 14 2013 16:29:59 EDT Ventricular Rate:  67 PR Interval:  184 QRS Duration: 96 QT Interval:  422 QTC Calculation: 445 R Axis:   9 Text Interpretation:  Normal sinus rhythm Normal ECG Confirmed by Swayzie Choate   MD, Ellenore Roscoe [phone]) on 10/14/2013 6:05:12 PM      MDM   Final diagnoses:  Dyspnea   Pt seen by cardiology in er and it was felt she could be discharged with out pt follow up    Maudry Diego, MD 10/15/13 2054

## 2013-10-20 ENCOUNTER — Encounter: Payer: Self-pay | Admitting: Pharmacotherapy

## 2013-10-20 ENCOUNTER — Ambulatory Visit (INDEPENDENT_AMBULATORY_CARE_PROVIDER_SITE_OTHER): Payer: Medicare PPO | Admitting: Pharmacotherapy

## 2013-10-20 VITALS — BP 138/84 | HR 73 | Resp 12 | Wt 356.0 lb

## 2013-10-20 DIAGNOSIS — I10 Essential (primary) hypertension: Secondary | ICD-10-CM

## 2013-10-20 DIAGNOSIS — N183 Chronic kidney disease, stage 3 unspecified: Secondary | ICD-10-CM

## 2013-10-20 DIAGNOSIS — E1165 Type 2 diabetes mellitus with hyperglycemia: Secondary | ICD-10-CM

## 2013-10-20 DIAGNOSIS — E1129 Type 2 diabetes mellitus with other diabetic kidney complication: Secondary | ICD-10-CM

## 2013-10-20 MED ORDER — INSULIN GLARGINE 300 UNIT/ML ~~LOC~~ SOPN: 100.0000 [IU] | PEN_INJECTOR | Freq: Every day | SUBCUTANEOUS | Status: DC

## 2013-10-20 MED ORDER — INSULIN LISPRO 100 UNIT/ML (KWIKPEN): 20.0000 [IU] | PEN_INJECTOR | Freq: Two times a day (BID) | SUBCUTANEOUS | Status: DC

## 2013-10-20 NOTE — Patient Instructions (Signed)
Stop Lantus (when use up current supply) Start Toujeo 100 units daily (split into  2 injections of 50 units) Increase Humalog 20 units twice daily.

## 2013-10-20 NOTE — Progress Notes (Signed)
Subjective:    [name] is a 79 y.o.African American female who presents for follow-up of Type 2 diabetes mellitus.   Her last A1C was 11.1% in April 2015 (this is consistent with the past year).  Her BG this morning was 254mg /dl  Her dietary habits are poor.  She can tell me the healthy choices, but chooses not make them. She has only been to water aerobics once.  She intends to go back. She is having problems with feet - pain.   She does have peripheral edema. She has polydipsia.  Eating ice to get more fluids. Nocturia at least 4 times per night.  Logbook - 146-240 fasting, 387 evening (only checked once non-fasting) She is giving Lantus 80 units daily (split at 2 injections of 40 units) She is giving Humalog 16 units with each meal (only eats 2 meals per day)  Review of Systems A comprehensive review of systems was negative except for: Constitutional: positive for fatigue Eyes: positive for blurry vision Cardiovascular: positive for lower extremity edema Genitourinary: positive for nocturia Musculoskeletal: positive for arthralgias and muscle weakness Neurological: positive for peripheral neuropathy Endocrine: positive for diabetic symptoms including blurry vision, increased fatigue, polydipsia and skin dryness    Objective:    BP 138/84  Pulse 73  Resp 12  Wt 356 lb (161.481 kg)  SpO2 98%  General:  alert, cooperative, no distress and morbidly obese  Oropharynx: normal findings: lips normal without lesions and gums healthy   Eyes:  negative findings: lids and lashes normal and conjunctivae and sclerae normal   Ears:  external ears normal        Lung: clear to auscultation bilaterally  Heart:  regular rate and rhythm     Extremities: edema bilateral lower extremities  Skin: dry     Neuro: mental status, speech normal, alert and oriented x3   Lab Review Glucose (mg/dL)  Date Value  08/11/2013 209*  05/12/2013 132*  01/08/2013 380*     Glucose, Bld (mg/dL)   Date Value  10/14/2013 325*  01/19/2013 418*  07/31/2012 209*     CO2 (mEq/L)  Date Value  10/14/2013 26   08/11/2013 25   05/12/2013 26      BUN (mg/dL)  Date Value  10/14/2013 20   08/11/2013 20   05/12/2013 27   01/19/2013 20   01/08/2013 17   07/31/2012 23      Creat (mg/dL)  Date Value  10/21/2010 1.33*     Creatinine (mg/dL)  Date Value  05/13/2012 1.4*     Creatinine, Ser (mg/dL)  Date Value  10/14/2013 1.15*  08/11/2013 1.40*  05/12/2013 1.24*       Assessment:    Diabetes Mellitus type II, under poor control.  BP at goal <140/90   Plan:    1.  Rx changes: 1.  stop Lantus.  Start Toujeo 100 units daily to improve insulin absorption with concentrated insulin.  2.  Increase Humalog to 20 units with meals. 2.  Not a candidate for SGLT-2 agent due to poor renal function. 3.  Counseled on nutrition goals and snack choices. 4.  Counseled on benefit of routine exercise.  Goal is 30-45 minutes 5 x week.  She has [employer] research officer, government.  She is to check at [facility] for a trainer to help her come up with an exercise plan. 5.  Counseled on complications of uncontrolled DM. 6.  BP at goal <140/90.  Continue metoprolol, furosemide. 7.  RTC in 6 weeks.

## 2013-10-21 ENCOUNTER — Ambulatory Visit: Payer: Medicare PPO | Admitting: Internal Medicine

## 2013-11-05 ENCOUNTER — Encounter: Payer: Self-pay | Admitting: Internal Medicine

## 2013-11-05 ENCOUNTER — Ambulatory Visit (INDEPENDENT_AMBULATORY_CARE_PROVIDER_SITE_OTHER): Payer: Medicare PPO | Admitting: Internal Medicine

## 2013-11-05 VITALS — BP 138/84 | HR 79 | Temp 98.4°F | Resp 12 | Wt 343.0 lb

## 2013-11-05 DIAGNOSIS — E1129 Type 2 diabetes mellitus with other diabetic kidney complication: Secondary | ICD-10-CM

## 2013-11-05 DIAGNOSIS — G4733 Obstructive sleep apnea (adult) (pediatric): Secondary | ICD-10-CM

## 2013-11-05 DIAGNOSIS — M1990 Unspecified osteoarthritis, unspecified site: Secondary | ICD-10-CM

## 2013-11-05 DIAGNOSIS — E1165 Type 2 diabetes mellitus with hyperglycemia: Principal | ICD-10-CM

## 2013-11-05 MED ORDER — INSULIN LISPRO 100 UNIT/ML (KWIKPEN): 20.0000 [IU] | PEN_INJECTOR | Freq: Two times a day (BID) | SUBCUTANEOUS | Status: DC

## 2013-11-05 MED ORDER — OXYCODONE-ACETAMINOPHEN 10-325 MG PO TABS
ORAL_TABLET | ORAL | Status: DC
Start: 2013-11-05 — End: 2013-12-10

## 2013-11-05 NOTE — Progress Notes (Signed)
Patient ID: [name], female   DOB: 09/06/1934, 79 y.o.   MRN: [id number]    Chief Complaint  Patient presents with  . Orders    Patient needs orders/referral for CPAP   . Medication Refill    Increase dispensed number for Insulin patient runs out sooner than usual    Allergies  Allergen Reactions  . Sulfonamide Derivatives Swelling    To mouth   HPI 79 y/o female pt with poorly controlled diabetes and obesity is here for follow up from her ED visit and need for CPAP machine. She had sleep study 2 years back according to patientand was not willing to use cpap then. She was in the ED few weeks back with worsening dyspnea. It was thought to be from her progressive OSA and OHS.  She has history of CAD with stent, morbid obesity, severe OA confined to a wheelchair, COPD, DM, HTN, HLD, incontinence, OSA, polymyalgia rheumatica, anxiety, depression and pulmonary embolism.   She has not been able to afford her tujeo and has ran out of her lantus and says she can't afford lantus or humalog at present cbg between 140-250  ROS Denies chets pain Has dyspnea Unable to sleep well at night  Past Medical History  Diagnosis Date  . COPD (chronic obstructive pulmonary disease)     patient denies this  . Diabetes mellitus   . Diverticular disease   . Hypertension   . Hyperlipidemia   . Coronary artery disease     a. s/p IMI 2004 tx with BMS to RCA;  b. s/p Promus DES to RCA 2/12 (cath: LM 20-30%, pLAD 20-30%, mLAD 50%, RI 30%, pRCA 60%, mRCA 90% - tx with PCI);  c. myoview 1/07: EF 49%, inf scar, no isch  . Urinary incontinence   . Insomnia   . Osteoarthritis   . Morbid obesity   . Obstructive sleep apnea   . Polymyalgia rheumatica   . Anxiety   . Arthritis   . Depression   . Cholelithiasis   . CHF (congestive heart failure)   . GERD (gastroesophageal reflux disease)   . Pulmonary emboli 9/13    felt to need lifelong anticoagulation  . Debility   . Unspecified constipation   .  Allergy   . Gout, unspecified   . Dysuria   . Urinary complications   . Pain, chronic   . Allergic rhinitis due to pollen   . Osteoarthrosis, unspecified whether generalized or localized, lower leg   . Type II or unspecified type diabetes mellitus without mention of complication, uncontrolled   . Anemia, unspecified   . Coronary atherosclerosis of native coronary artery   . Other malaise and fatigue    Current Outpatient Prescriptions on File Prior to Visit  Medication Sig Dispense Refill  . clonazePAM (KLONOPIN) 1 MG tablet Take one tablet in the morning and two tablets at bedtime for sleep  90 tablet  3  . colchicine (COLCRYS) 0.6 MG tablet Take 1 tablet by mouth every day for gout  30 tablet  3  . diphenhydrAMINE (BENADRYL) 25 MG tablet Take 50 mg by mouth at bedtime as needed for itching.      . docusate sodium (COLACE) 100 MG capsule Take 100 mg by mouth 3 (three) times daily as needed for mild constipation.      . DULoxetine (CYMBALTA) 60 MG capsule Take 1 capsule by mouth every day for anxiety  30 capsule  3  . FIBER FORMULA PO Take 3  tablets by mouth daily. gummy      . furosemide (LASIX) 20 MG tablet Take one tablet by mouth twice daily for edema  60 tablet  3  . hydrocortisone cream 1 % Apply 1 application topically daily as needed for itching.      . Insulin Glargine (TOUJEO SOLOSTAR) 300 UNIT/ML SOPN Inject 100 Units into the skin daily.  13.5 mL  4  . isosorbide mononitrate (IMDUR) 60 MG 24 hr tablet Take 1 tablet (60 mg total) by mouth daily.  30 tablet  3  . metoprolol succinate (TOPROL-XL) 25 MG 24 hr tablet Take 1 tablet by mouth once every day for blood pressure  30 tablet  3  . nitroGLYCERIN (NITROSTAT) 0.4 MG SL tablet Take one tablet under the tongue every 5 minutes as needed for chest pain  50 tablet  0  . pantoprazole (PROTONIX) 40 MG tablet Take 1 tablet by mouth every day  30 tablet  3  . Phenylephrine-APAP-Guaifenesin (MUCINEX SINUS-MAX CONGESTION) 5-325-200 MG  TABS Take 1 tablet by mouth daily as needed (congestion).       . pravastatin (PRAVACHOL) 20 MG tablet Take 1 tablet by mouth once every day for cholesterol  30 tablet  3  . Sennosides (EX-LAX PO) Take 2 tablets by mouth every other day.      . warfarin (COUMADIN) 5 MG tablet Take 2.5-5 mg by mouth daily. Take 2.5mg  on Tuesday and Thursdays.  All other days take 5mg        No current facility-administered medications on file prior to visit.    Physical exam BP 138/84  Pulse 79  Temp(Src) 98.4 F (36.9 C) (Oral)  Resp 12  Wt 343 lb (155.584 kg)  SpO2 98%  General- elderly female in no acute distress, morbidly obese on wheelchair Head- atraumatic, normocephalic Eyes- PERRLA, EOMI, no pallor, no icterus, no discharge Neck- no lymphadenopathy Cardiovascular- normal s1,s2, no murmurs Respiratory- bilateral clear to auscultation Musculoskeletal- on wheelchair, able to move all extremities Neurological- no focal deficitSkin- warm and dry Psychiatry- oriented to person, place and time, normal mood and affect  Labs- Lab Results  Component Value Date   HGBA1C 11.1* 08/11/2013   CBC    Component Value Date/Time   WBC 10.0 10/14/2013 1644   WBC 8.2 05/12/2013 1112   RBC 4.07 10/14/2013 1644   RBC 3.90 05/12/2013 1112   HGB 10.7* 10/14/2013 1644   HCT 35.1* 10/14/2013 1644   PLT 299 10/14/2013 1644   MCV 86.2 10/14/2013 1644   MCH 26.3 10/14/2013 1644   MCH 27.9 05/12/2013 1112   MCHC 30.5 10/14/2013 1644   MCHC 32.9 05/12/2013 1112   RDW 14.8 10/14/2013 1644   RDW 14.2 05/12/2013 1112   LYMPHSABS 3.3* 05/12/2013 1112   LYMPHSABS 3.3 07/31/2012 0905   MONOABS 0.7 07/31/2012 0905   EOSABS 0.2 05/12/2013 1112   EOSABS 0.2 07/31/2012 0905   BASOSABS 0.0 05/12/2013 1112   BASOSABS 0.0 07/31/2012 0905    CMP     Component Value Date/Time   NA 139 10/14/2013 1644   NA 136 08/11/2013 1133   K 4.7 10/14/2013 1644   CL 100 10/14/2013 1644   CO2 26 10/14/2013 1644   GLUCOSE 325* 10/14/2013 1644   GLUCOSE 209* 08/11/2013 1133     BUN 20 10/14/2013 1644   BUN 20 08/11/2013 1133   CREATININE 1.15* 10/14/2013 1644   CREATININE 1.4* 05/13/2012   CREATININE 1.33* 10/21/2010 1519   CALCIUM 9.1 10/14/2013 1644  PROT 7.2 08/11/2013 1133   PROT 7.5 01/25/2012 1921   ALBUMIN 3.2* 01/25/2012 1921   AST 16 08/11/2013 1133   ALT 14 08/11/2013 1133   ALKPHOS 98 08/11/2013 1133   BILITOT 0.2 08/11/2013 1133   GFRNONAA 45* 10/14/2013 1644   GFRNONAA 39* 10/21/2010 1519   GFRAA 52* 10/14/2013 1644   GFRAA 47* 10/21/2010 1519    Assessment/plan  1. Type II or unspecified type diabetes mellitus with renal manifestations, uncontrolled Have some sample of toujeo- provided her with 3 samples. Will need forms filled out to see if pt can get medication at reduced cost. With cost being a major issue, it will be difficult to maintain her diabetes which is already very poorly controlled - insulin lispro (HUMALOG) 100 UNIT/ML KiwkPen; Inject 0.2 mLs (20 Units total) into the skin 2 (two) times daily before a meal.  Dispense: 15 mL; Refill: 11  2. Osteoarthrosis, unspecified whether generalized or localized, unspecified site - oxyCODONE-acetaminophen (PERCOCET) 10-325 MG per tablet; Take one tablet every 6 hours as needed for pain  Dispense: 120 tablet; Refill: 0  3. OSA (obstructive sleep apnea) With her sleep study being from 2 years back, i will provide pulmonary referral for possible need of repeat sleep study and cpap machine setting titration for [employer] company to be able to dispense it - Ambulatory referral to Pulmonology

## 2013-11-24 ENCOUNTER — Ambulatory Visit (INDEPENDENT_AMBULATORY_CARE_PROVIDER_SITE_OTHER): Payer: Medicare PPO | Admitting: Surgery

## 2013-11-24 DIAGNOSIS — Z5181 Encounter for therapeutic drug level monitoring: Secondary | ICD-10-CM

## 2013-11-24 DIAGNOSIS — Z7901 Long term (current) use of anticoagulants: Secondary | ICD-10-CM

## 2013-11-24 DIAGNOSIS — I2699 Other pulmonary embolism without acute cor pulmonale: Secondary | ICD-10-CM

## 2013-11-24 LAB — POCT INR: INR: 2.3

## 2013-12-01 ENCOUNTER — Ambulatory Visit: Payer: Medicare PPO | Admitting: Pharmacotherapy

## 2013-12-01 DIAGNOSIS — Z0289 Encounter for other administrative examinations: Secondary | ICD-10-CM

## 2013-12-09 ENCOUNTER — Ambulatory Visit (INDEPENDENT_AMBULATORY_CARE_PROVIDER_SITE_OTHER): Payer: Medicare PPO | Admitting: Pulmonary Disease

## 2013-12-09 ENCOUNTER — Encounter: Payer: Self-pay | Admitting: Pulmonary Disease

## 2013-12-09 VITALS — BP 130/70 | HR 83 | Temp 98.6°F | Ht 67.0 in | Wt 357.8 lb

## 2013-12-09 DIAGNOSIS — G4733 Obstructive sleep apnea (adult) (pediatric): Secondary | ICD-10-CM

## 2013-12-09 NOTE — Progress Notes (Signed)
   Subjective:    Patient ID: [name], female    DOB: 12-09-1934, 79 y.o.   MRN: [id number]  HPI Patient comes in today for followup of her very severe obstructive sleep apnea. She was last seen in 2014 wear a CPAP device was ordered. However, the patient decided that she could not afford the co-pays, and did not get the CPAP device. Unfortunately, she did not call me to let me know this, and I have not seen her since. She comes in today where she continues to have her sleep issues, and has not lost significant weight.   Review of Systems  Constitutional: Negative for fever and unexpected weight change.  HENT: Negative for congestion, dental problem, ear pain, nosebleeds, postnasal drip, rhinorrhea, sinus pressure, sneezing, sore throat and trouble swallowing.   Eyes: Negative for redness and itching.  Respiratory: Negative for cough, chest tightness, shortness of breath and wheezing.   Cardiovascular: Negative for palpitations and leg swelling.  Gastrointestinal: Negative for nausea and vomiting.  Genitourinary: Negative for dysuria.  Musculoskeletal: Negative for joint swelling.  Skin: Negative for rash.  Neurological: Negative for headaches.  Hematological: Does not bruise/bleed easily.  Psychiatric/Behavioral: Negative for dysphoric mood. The patient is not nervous/anxious.        Objective:   Physical Exam Morbidly obese female in no acute distress Nose without purulence or discharge noted Neck without lymphadenopathy or thyromegaly Lower extremities with significant edema, no cyanosis Alert and oriented, moves all 4 extremities.       Assessment & Plan:

## 2013-12-09 NOTE — Patient Instructions (Signed)
Will try and get you a cpap device thru a sleep organization.  Let me know if this does not work out so we can keep trying. Work on weight loss followup with me 8 weeks after you get the cpap device.

## 2013-12-09 NOTE — Assessment & Plan Note (Signed)
The patient has a history of extremely severe obstructive sleep apnea, and has not been able to lose weight successfully. I have explained to her that it is critical that we start treatment with a positive pressure device, but unfortunately she cannot afford the co-pays involved. I will see if we can get her CPAP device through a terrible sleep organization. I've also stressed to her the importance of aggressive weight loss.

## 2013-12-10 ENCOUNTER — Other Ambulatory Visit: Payer: Self-pay | Admitting: *Deleted

## 2013-12-10 DIAGNOSIS — M1990 Unspecified osteoarthritis, unspecified site: Secondary | ICD-10-CM

## 2013-12-10 MED ORDER — OXYCODONE-ACETAMINOPHEN 10-325 MG PO TABS: ORAL_TABLET | ORAL | Status: DC

## 2013-12-10 NOTE — Telephone Encounter (Signed)
Patient Requested 

## 2013-12-11 ENCOUNTER — Other Ambulatory Visit: Payer: Medicare PPO

## 2013-12-11 DIAGNOSIS — E1165 Type 2 diabetes mellitus with hyperglycemia: Principal | ICD-10-CM

## 2013-12-11 DIAGNOSIS — E1129 Type 2 diabetes mellitus with other diabetic kidney complication: Secondary | ICD-10-CM

## 2013-12-12 LAB — COMPREHENSIVE METABOLIC PANEL
ALT: 18 IU/L (ref 0–32)
AST: 20 IU/L (ref 0–40)
Albumin/Globulin Ratio: 1.1 (ref 1.1–2.5)
Albumin: 3.8 g/dL (ref 3.5–4.8)
Alkaline Phosphatase: 84 IU/L (ref 39–117)
BUN/Creatinine Ratio: 18 (ref 11–26)
BUN: 24 mg/dL (ref 8–27)
CO2: 24 mmol/L (ref 18–29)
Calcium: 8.7 mg/dL (ref 8.7–10.3)
Chloride: 100 mmol/L (ref 97–108)
Creatinine, Ser: 1.35 mg/dL — ABNORMAL HIGH (ref 0.57–1.00)
GFR calc Af Amer: 44 mL/min/{1.73_m2} — ABNORMAL LOW (ref >59)
GFR calc non Af Amer: 38 mL/min/{1.73_m2} — ABNORMAL LOW (ref >59)
Globulin, Total: 3.4 g/dL (ref 1.5–4.5)
Glucose: 121 mg/dL — ABNORMAL HIGH (ref 65–99)
Potassium: 4.9 mmol/L (ref 3.5–5.2)
Sodium: 140 mmol/L (ref 134–144)
Total Bilirubin: 0.3 mg/dL (ref 0.0–1.2)
Total Protein: 7.2 g/dL (ref 6.0–8.5)

## 2013-12-12 LAB — HEMOGLOBIN A1C
Est. average glucose Bld gHb Est-mCnc: 278 mg/dL
Hgb A1c MFr Bld: 11.3 % — ABNORMAL HIGH (ref 4.8–5.6)

## 2013-12-15 ENCOUNTER — Encounter: Payer: Self-pay | Admitting: Pharmacotherapy

## 2013-12-15 ENCOUNTER — Ambulatory Visit (INDEPENDENT_AMBULATORY_CARE_PROVIDER_SITE_OTHER): Payer: Medicare PPO | Admitting: Pharmacotherapy

## 2013-12-15 ENCOUNTER — Encounter: Payer: Self-pay | Admitting: *Deleted

## 2013-12-15 VITALS — BP 122/68 | HR 84 | Temp 97.6°F

## 2013-12-15 DIAGNOSIS — N183 Chronic kidney disease, stage 3 unspecified: Secondary | ICD-10-CM

## 2013-12-15 DIAGNOSIS — E1129 Type 2 diabetes mellitus with other diabetic kidney complication: Secondary | ICD-10-CM

## 2013-12-15 DIAGNOSIS — I10 Essential (primary) hypertension: Secondary | ICD-10-CM

## 2013-12-15 DIAGNOSIS — E1165 Type 2 diabetes mellitus with hyperglycemia: Secondary | ICD-10-CM

## 2013-12-15 MED ORDER — INSULIN LISPRO 100 UNIT/ML (KWIKPEN)
20.0000 [IU] | PEN_INJECTOR | Freq: Two times a day (BID) | SUBCUTANEOUS | Status: DC
Start: 2013-12-15 — End: 2014-03-09

## 2013-12-15 NOTE — Patient Instructions (Signed)
Add Humalog 5 units when eating yogurt. Go to [facility] and take advantage of [facility] [employer]. Start checking blood glucose in the afternoon too.

## 2013-12-15 NOTE — Progress Notes (Signed)
  Subjective:    [name] is a 79 y.o.African American female who presents for follow-up of Type 2 diabetes mellitus.  Current A1C is 11.3% (was 11.1%) Last OV changed her to concentrated glargine (Toujeo) and increased the dose to 100 units. Also increased her Humalog to 20 units with meals.  Has noticed improved glycemic control since change. She reports her average BG:  150-170 in the morning. Not SMBG in the afternoon. No hypoglycemia.  Doing home exercise twice a day.  Chair exercise. Has only been to the [facility] once since last OV. Still only eating 2 meals a day.  12p and 6p.  Continues to snack during the day.  Making somewhat better snack choices.  Still has pain in feet.  No sores. Some peripheral edema. Vision is blurry - worse. Nocturia 3 times per night.    Review of Systems A comprehensive review of systems was negative except for: Eyes: positive for blurry vision Cardiovascular: positive for lower extremity edema Genitourinary: positive for nocturia Neurological: positive for gait problems and peripheral neuropathy Endocrine: positive for diabetic symptoms including blurry vision and increased fatigue    Objective:    BP 122/68  Pulse 84  Temp(Src) 97.6 F (36.4 C) (Oral)  SpO2 98%  General:  alert, cooperative, no distress and morbidly obese  Oropharynx: normal findings: lips normal without lesions and gums healthy   Eyes:  negative findings: lids and lashes normal and conjunctivae and sclerae normal   Ears:  external ears normal        Lung: clear to auscultation bilaterally  Heart:  regular rate and rhythm     Extremities: edema bilateral lower extremities  Skin: warm and dry, no hyperpigmentation, vitiligo, or suspicious lesions     Neuro: mental status, speech normal, alert and oriented x3 and in wheelchar   Lab Review Glucose (mg/dL)  Date Value  12/11/2013 121*  08/11/2013 209*  05/12/2013 132*     Glucose, Bld (mg/dL)  Date Value   10/14/2013 325*  01/19/2013 418*  07/31/2012 209*     CO2 (mmol/L)  Date Value  12/11/2013 24   10/14/2013 26   08/11/2013 25      BUN (mg/dL)  Date Value  12/11/2013 24   10/14/2013 20   08/11/2013 20   05/12/2013 27   01/19/2013 20   07/31/2012 23      Creat (mg/dL)  Date Value  10/21/2010 1.33*     Creatinine (mg/dL)  Date Value  05/13/2012 1.4*     Creatinine, Ser (mg/dL)  Date Value  12/11/2013 1.35*  10/14/2013 1.15*  08/11/2013 1.40*    12/11/13:   A1C:  11.3% ALT:  20 AST:  18   Assessment:    Diabetes Mellitus type II, under poor control. A1C essentially unchanged. BP at goal <140/90   Plan:    1.  Rx changes: add Humalog 5 units when she eats yogurt snacks. 2.  Will refer to podiatrist for toenail care.  She cannot reach them by herself. 3.  Continue Toujeo 100 units daily. 4.  Continue Humalog 20 units with main meals. 5.  Reviewed nutrition goals. 6.  Counseled on need for routine exercise.  Her son has agreed to [facility] for the [employer] program.  Goal is 30-45 minutes 5 x week. 7.  BP at goal <140/90.  Continue current RX.

## 2013-12-17 ENCOUNTER — Encounter: Payer: Self-pay | Admitting: Pulmonary Disease

## 2013-12-22 ENCOUNTER — Telehealth: Payer: Self-pay | Admitting: Pulmonary Disease

## 2013-12-22 NOTE — Telephone Encounter (Signed)
Arbie Cookey already spoke with libby. Will sign off

## 2013-12-31 ENCOUNTER — Ambulatory Visit (INDEPENDENT_AMBULATORY_CARE_PROVIDER_SITE_OTHER): Payer: Medicare PPO | Admitting: Internal Medicine

## 2013-12-31 ENCOUNTER — Encounter: Payer: Self-pay | Admitting: Internal Medicine

## 2013-12-31 VITALS — BP 134/80 | HR 85 | Resp 10 | Wt 357.0 lb

## 2013-12-31 DIAGNOSIS — R079 Chest pain, unspecified: Secondary | ICD-10-CM

## 2013-12-31 DIAGNOSIS — R300 Dysuria: Secondary | ICD-10-CM

## 2013-12-31 DIAGNOSIS — Z7189 Other specified counseling: Secondary | ICD-10-CM

## 2013-12-31 DIAGNOSIS — R0789 Other chest pain: Secondary | ICD-10-CM | POA: Insufficient documentation

## 2013-12-31 HISTORY — DX: Chest pain, unspecified: R07.9

## 2013-12-31 LAB — POCT URINALYSIS DIPSTICK
Bilirubin, UA: NEGATIVE
Blood, UA: NEGATIVE
Glucose, UA: NEGATIVE
Ketones, UA: NEGATIVE
Nitrite, UA: NEGATIVE
Protein, UA: NEGATIVE
Spec Grav, UA: <=1.005
Urobilinogen, UA: 0.2
pH, UA: 6.5

## 2013-12-31 MED ORDER — PHENAZOPYRIDINE HCL 100 MG PO TABS: 100.0000 mg | ORAL_TABLET | Freq: Three times a day (TID) | ORAL | Status: DC | PRN

## 2013-12-31 MED ORDER — NITROFURANTOIN MONOHYD MACRO 100 MG PO CAPS: 100.0000 mg | ORAL_CAPSULE | Freq: Two times a day (BID) | ORAL | Status: DC

## 2013-12-31 NOTE — Progress Notes (Signed)
Patient ID: MARLENE BADENHOP, female   DOB: 04-Sep-1934, 79 y.o.   MRN: [id number]    Chief Complaint  Patient presents with  . Acute Visit    urinary complaints, chest pain, concerns with statin   Allergies  Allergen Reactions  . Sulfonamide Derivatives Swelling    To mouth   HPI 79 y/o female patient is here for acute visit. She has urinary complaints for a week- burning and pain with urination. Denies any blood in urine. No abdominal or flank pain. Mentions about having vaginal discharge (none at present ) but at the beginning of the month on monthly basis for several years and follows with gyn for this- lost to follow up.  She during conversation complaints of chest discomfort going up her throat to her right ear. She has heart problems and mentions " this is not the heart pain". She complaints of wanting to burp and denies racing of heart or SOB  She mentions being on cholesterol pill and reading about memory issues with it. She feels she is having memory problem due to her pill   ROS Denies fever or chills No dyspnea No runny nose or sore throat No falls reported No nausea or vomiting  Here with her 2 grandsons  Past Medical History  Diagnosis Date  . COPD (chronic obstructive pulmonary disease)     patient denies this  . Diabetes mellitus   . Diverticular disease   . Hypertension   . Hyperlipidemia   . Coronary artery disease     a. s/p IMI 2004 tx with BMS to RCA;  b. s/p Promus DES to RCA 2/12 (cath: LM 20-30%, pLAD 20-30%, mLAD 50%, RI 30%, pRCA 60%, mRCA 90% - tx with PCI);  c. myoview 1/07: EF 49%, inf scar, no isch  . Urinary incontinence   . Insomnia   . Osteoarthritis   . Morbid obesity   . Obstructive sleep apnea   . Polymyalgia rheumatica   . Anxiety   . Arthritis   . Depression   . Cholelithiasis   . CHF (congestive heart failure)   . GERD (gastroesophageal reflux disease)   . Pulmonary emboli 9/13    felt to need lifelong anticoagulation  .  Debility   . Unspecified constipation   . Allergy   . Gout, unspecified   . Dysuria   . Urinary complications   . Pain, chronic   . Allergic rhinitis due to pollen   . Osteoarthrosis, unspecified whether generalized or localized, lower leg   . Type II or unspecified type diabetes mellitus without mention of complication, uncontrolled   . Anemia, unspecified   . Coronary atherosclerosis of native coronary artery   . Other malaise and fatigue    Current Outpatient Prescriptions on File Prior to Visit  Medication Sig Dispense Refill  . clonazePAM (KLONOPIN) 1 MG tablet Take one tablet in the morning and two tablets at bedtime for sleep  90 tablet  3  . colchicine (COLCRYS) 0.6 MG tablet Take 1 tablet by mouth every day for gout  30 tablet  3  . diphenhydrAMINE (BENADRYL) 25 MG tablet Take 50 mg by mouth at bedtime as needed for itching.      . docusate sodium (COLACE) 100 MG capsule Take 100 mg by mouth 3 (three) times daily as needed for mild constipation.      . DULoxetine (CYMBALTA) 60 MG capsule Take 1 capsule by mouth every day for anxiety  30 capsule  3  .  FIBER FORMULA PO Take 3 tablets by mouth daily. gummy      . furosemide (LASIX) 20 MG tablet Take one tablet by mouth twice daily for edema  60 tablet  3  . hydrocortisone cream 1 % Apply 1 application topically daily as needed for itching.      . Insulin Glargine (TOUJEO SOLOSTAR) 300 UNIT/ML SOPN Inject 100 Units into the skin daily.  13.5 mL  4  . insulin lispro (HUMALOG) 100 UNIT/ML KiwkPen Inject 0.2 mLs (20 Units total) into the skin 2 (two) times daily before a meal.  15 mL  11  . isosorbide mononitrate (IMDUR) 60 MG 24 hr tablet Take 1 tablet (60 mg total) by mouth daily.  30 tablet  3  . metoprolol succinate (TOPROL-XL) 25 MG 24 hr tablet Take 1 tablet by mouth once every day for blood pressure  30 tablet  3  . nitroGLYCERIN (NITROSTAT) 0.4 MG SL tablet Take one tablet under the tongue every 5 minutes as needed for chest  pain  50 tablet  0  . oxyCODONE-acetaminophen (PERCOCET) 10-325 MG per tablet Take one tablet every 6 hours as needed for pain  120 tablet  0  . pantoprazole (PROTONIX) 40 MG tablet Take 1 tablet by mouth every day  30 tablet  3  . Phenylephrine-APAP-Guaifenesin (MUCINEX SINUS-MAX CONGESTION) 5-325-200 MG TABS Take 1 tablet by mouth daily as needed (congestion).       . pravastatin (PRAVACHOL) 20 MG tablet Take 1 tablet by mouth once every day for cholesterol  30 tablet  3  . Sennosides (EX-LAX PO) Take 2 tablets by mouth every other day.      . warfarin (COUMADIN) 5 MG tablet Take 2.5-5 mg by mouth daily. Take 2.5mg  on Tuesday and Thursdays.  All other days take 5mg        No current facility-administered medications on file prior to visit.    Physical exam BP 134/80  Pulse 85  Resp 10  Wt 357 lb (161.934 kg)  SpO2 98%  Constitutional: She is oriented to person, place, and time. Morbidly obese HENT:   Head: Normocephalic.  Eyes: Conjunctivae and EOM are normal. No scleral icterus.  Neck: Neck supple. No thyromegaly present.  Ear: cerumen in both ears Cardiovascular: Normal rate and regular rhythm.  Exam reveals no gallop and no friction rub.    No murmur heard. No reproducible chest pain Pulmonary/Chest: No stridor. She has no wheezes. She has no rales. She exhibits no tenderness.  Abdominal: She exhibits no distension. There is no tenderness. There is no rebound. No suprapubic tenderness. No cva tenderness Musculoskeletal: Normal range of motion. She exhibits no edema.  Lymphadenopathy:    She has no cervical adenopathy.  Neurological: She is oriented to person, place, and time. She exhibits normal muscle tone. Coordination normal.  Skin: No rash noted. No erythema.  Psychiatric: She has a normal mood and affect. Her behavior is normal.   ekg- NSR, LAD, unchanged from last EKG  Assessment/plan  1. Dysuria Send urine for culture. With leukocytes in urine sample and pt being  symptomatic, will treat her empirically with nitrofurantoin 100 mg bid for a week and pyridium tid prn for dysuria and reassess. Encouraged hydration - POCT urinalysis dipstick - Urine culture  2. Chest pain, unspecified chest pain type Reviewed ekg, no acute changes noted. No reproducible chest pain thus unlikely to be musculoskeletal. Pain improved without any intervention, patient in no distress. Possible gastritis/ reflux causing the discomfort. Pt  to take her ppi, advised to notify us if recurs - EKG 12-Lead  3. Statin concerns Explained to pt the benefit and adverse effects of statin. With her history of morbid obesity, CAD, DM and HTN she will benefit from being on statin. Patient agrees with continuing to be on statin

## 2014-01-01 ENCOUNTER — Ambulatory Visit (INDEPENDENT_AMBULATORY_CARE_PROVIDER_SITE_OTHER): Payer: Medicare PPO | Admitting: Pharmacist

## 2014-01-01 DIAGNOSIS — I2699 Other pulmonary embolism without acute cor pulmonale: Secondary | ICD-10-CM

## 2014-01-01 DIAGNOSIS — Z5181 Encounter for therapeutic drug level monitoring: Secondary | ICD-10-CM

## 2014-01-01 DIAGNOSIS — Z7901 Long term (current) use of anticoagulants: Secondary | ICD-10-CM

## 2014-01-01 LAB — POCT INR: INR: 1.8

## 2014-01-02 ENCOUNTER — Telehealth: Payer: Self-pay

## 2014-01-02 LAB — URINE CULTURE

## 2014-01-02 MED ORDER — AMOXICILLIN-POT CLAVULANATE 875-125 MG PO TABS: 1.0000 | ORAL_TABLET | Freq: Two times a day (BID) | ORAL | Status: DC

## 2014-01-02 MED ORDER — SACCHAROMYCES BOULARDII 250 MG PO CAPS: 250.0000 mg | ORAL_CAPSULE | Freq: Two times a day (BID) | ORAL | Status: DC

## 2014-01-02 NOTE — Telephone Encounter (Signed)
Spoke with patient, patient is out of town and needs rx's sent to [place] in [place]. I called Dr.Pandey to verify if patient should be on Augmentin or Levofloxacin. Dr.Pandey confirmed- Augmentin

## 2014-01-02 NOTE — Telephone Encounter (Signed)
Message copied by Logan Bores on Fri Jan 02, 2014 10:42 AM ------      Message from: Blanchie Serve      Created: Fri Jan 02, 2014 10:08 AM       Your urine has grown klebsilla bacteria that is not sensitive to antibiotic you are on. Stop nitrofurantoin for now. Will start levofloxacin augmentin 875 mg twice a day for 10 days with florastor 250 mg bid for 3 weeks. ------

## 2014-01-14 ENCOUNTER — Other Ambulatory Visit: Payer: Self-pay | Admitting: *Deleted

## 2014-01-14 DIAGNOSIS — M1990 Unspecified osteoarthritis, unspecified site: Secondary | ICD-10-CM

## 2014-01-14 MED ORDER — OXYCODONE-ACETAMINOPHEN 10-325 MG PO TABS: ORAL_TABLET | ORAL | Status: DC

## 2014-01-14 NOTE — Telephone Encounter (Signed)
Patient Requested 

## 2014-02-04 ENCOUNTER — Ambulatory Visit: Payer: Medicare PPO | Admitting: Pulmonary Disease

## 2014-02-09 ENCOUNTER — Ambulatory Visit: Payer: Medicare PPO | Admitting: Pulmonary Disease

## 2014-02-10 ENCOUNTER — Ambulatory Visit: Payer: Medicare PPO | Admitting: Internal Medicine

## 2014-02-12 ENCOUNTER — Ambulatory Visit (INDEPENDENT_AMBULATORY_CARE_PROVIDER_SITE_OTHER): Payer: Medicare PPO

## 2014-02-12 DIAGNOSIS — I2699 Other pulmonary embolism without acute cor pulmonale: Secondary | ICD-10-CM

## 2014-02-12 DIAGNOSIS — Z7901 Long term (current) use of anticoagulants: Secondary | ICD-10-CM

## 2014-02-12 DIAGNOSIS — Z5181 Encounter for therapeutic drug level monitoring: Secondary | ICD-10-CM

## 2014-02-12 LAB — POCT INR: INR: 1.8

## 2014-02-13 ENCOUNTER — Other Ambulatory Visit: Payer: Self-pay | Admitting: *Deleted

## 2014-02-13 DIAGNOSIS — G894 Chronic pain syndrome: Secondary | ICD-10-CM

## 2014-02-13 IMAGING — CR DG CHEST 1V PORT
1 series · 1 of 1 positions shown · non-contrast
Comparison: PA and lateral chest 09/08/2010.

CLINICAL DATA: Persistent cough for 2 months.

PORTABLE CHEST - 1 VIEW

[AP]
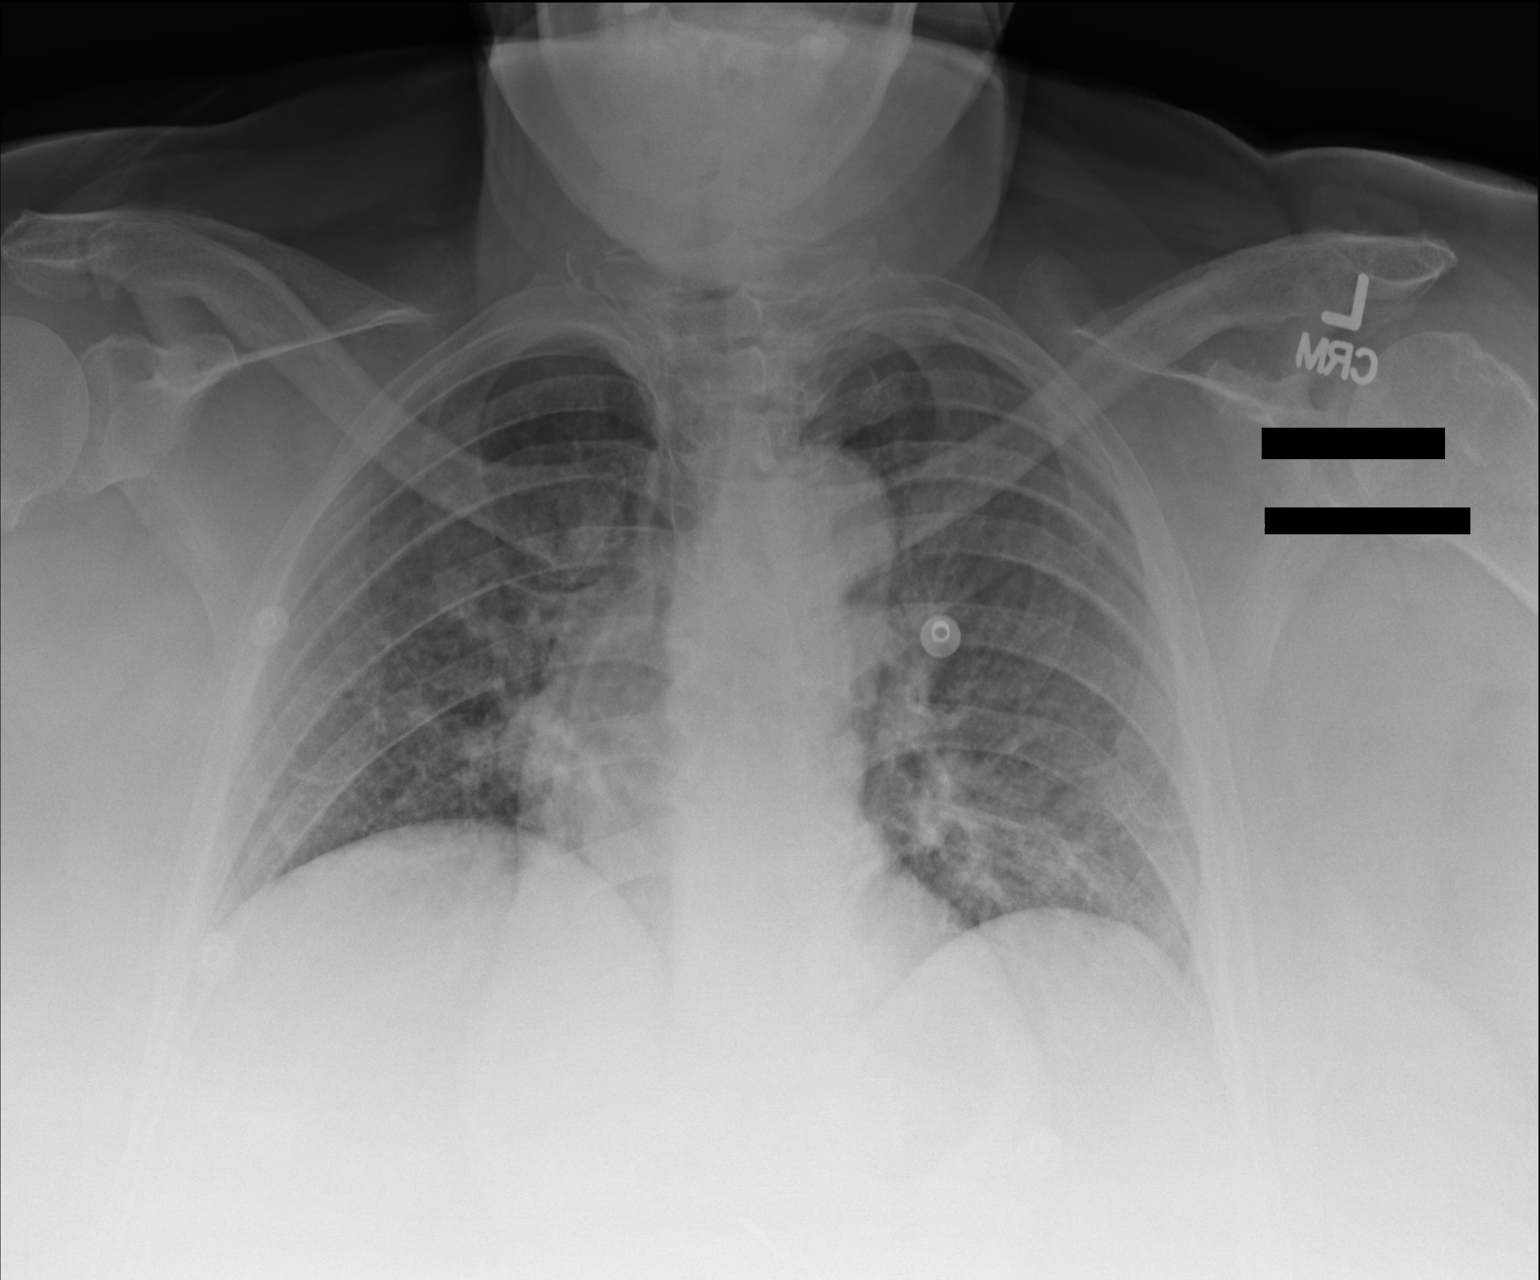

[1 of 1 positions shown; findings below may reference images not displayed]

FINDINGS: Lungs are clear.  Heart size is normal.  No pneumothorax
or pleural fluid.  Degenerative change about the shoulders noted.
IMPRESSION: No acute finding.

## 2014-02-13 MED ORDER — OXYCODONE-ACETAMINOPHEN 10-325 MG PO TABS: ORAL_TABLET | ORAL | Status: DC

## 2014-02-13 NOTE — Telephone Encounter (Signed)
Patient Requested and will pick up 

## 2014-02-17 ENCOUNTER — Ambulatory Visit (INDEPENDENT_AMBULATORY_CARE_PROVIDER_SITE_OTHER): Payer: Medicare PPO | Admitting: Pulmonary Disease

## 2014-02-17 ENCOUNTER — Encounter: Payer: Self-pay | Admitting: Pulmonary Disease

## 2014-02-17 VITALS — BP 124/70 | HR 77 | Temp 97.0°F | Ht 67.0 in | Wt 374.2 lb

## 2014-02-17 DIAGNOSIS — G4733 Obstructive sleep apnea (adult) (pediatric): Secondary | ICD-10-CM

## 2014-02-17 NOTE — Patient Instructions (Signed)
Will get you established with a home care company to get your machine set, and to provide mask fitting and new supplies. Work on weight loss followup with me again in 57mos, but call if having issues with wearing your cpap nightly.

## 2014-02-17 NOTE — Assessment & Plan Note (Signed)
The patient is having issues with her CPAP device because of ongoing mask leak issues. We will need to make adjustments to her sleep apnea, and will also get a home care company to work with her mask fit. Finally, I have encouraged her to work aggressively on weight loss.

## 2014-02-17 NOTE — Progress Notes (Signed)
   Subjective:    Patient ID: [name], female    DOB: Oct 14, 1934, 79 y.o.   MRN: [id number]  HPI The patient comes in today for followup of her obstructive sleep apnea. She was started on CPAP at the last visit, but has been having issues with significant mask leaking which has interfered with compliance. She apparently has tried to go by a home care company to get this fixed, but they were not willing to do so because she does not have an established relationship with them? When she wears her CPAP, her download shows excellent control of her AHI. The patient feels that she will do well with CPAP provided she can get her mask fit issues taking care of.   Review of Systems  Constitutional: Negative for fever and unexpected weight change.  HENT: Negative for congestion, dental problem, ear pain, nosebleeds, postnasal drip, rhinorrhea, sinus pressure, sneezing, sore throat and trouble swallowing.   Eyes: Negative for redness and itching.  Respiratory: Positive for wheezing. Negative for cough, chest tightness and shortness of breath.   Cardiovascular: Negative for palpitations and leg swelling.  Gastrointestinal: Negative for nausea and vomiting.  Genitourinary: Negative for dysuria.  Musculoskeletal: Negative for joint swelling.  Skin: Negative for rash.  Neurological: Negative for headaches.  Hematological: Does not bruise/bleed easily.  Psychiatric/Behavioral: Negative for dysphoric mood. The patient is not nervous/anxious.        Objective:   Physical Exam Morbidly obese female in no acute distress Nose without purulence or discharge noted No skin breakdown or pressure necrosis from the CPAP mask Neck without lymphadenopathy or thyromegaly Lower extremities with edema noted, no cyanosis Alert and oriented, moves all 4 extremities.       Assessment & Plan:

## 2014-02-18 ENCOUNTER — Ambulatory Visit (INDEPENDENT_AMBULATORY_CARE_PROVIDER_SITE_OTHER): Payer: Medicare PPO | Admitting: Internal Medicine

## 2014-02-18 ENCOUNTER — Encounter: Payer: Self-pay | Admitting: Internal Medicine

## 2014-02-18 VITALS — BP 154/80 | HR 76 | Temp 98.1°F | Resp 20 | Ht 67.0 in | Wt 375.0 lb

## 2014-02-18 DIAGNOSIS — E2839 Other primary ovarian failure: Secondary | ICD-10-CM

## 2014-02-18 DIAGNOSIS — N189 Chronic kidney disease, unspecified: Secondary | ICD-10-CM

## 2014-02-18 DIAGNOSIS — K219 Gastro-esophageal reflux disease without esophagitis: Secondary | ICD-10-CM

## 2014-02-18 DIAGNOSIS — Z1239 Encounter for other screening for malignant neoplasm of breast: Secondary | ICD-10-CM

## 2014-02-18 DIAGNOSIS — E1122 Type 2 diabetes mellitus with diabetic chronic kidney disease: Secondary | ICD-10-CM

## 2014-02-18 DIAGNOSIS — I5032 Chronic diastolic (congestive) heart failure: Secondary | ICD-10-CM

## 2014-02-18 DIAGNOSIS — R635 Abnormal weight gain: Secondary | ICD-10-CM

## 2014-02-18 DIAGNOSIS — I10 Essential (primary) hypertension: Secondary | ICD-10-CM

## 2014-02-18 DIAGNOSIS — E1165 Type 2 diabetes mellitus with hyperglycemia: Secondary | ICD-10-CM

## 2014-02-18 DIAGNOSIS — Z23 Encounter for immunization: Secondary | ICD-10-CM

## 2014-02-18 DIAGNOSIS — IMO0002 Reserved for concepts with insufficient information to code with codable children: Secondary | ICD-10-CM

## 2014-02-18 NOTE — Progress Notes (Signed)
Patient ID: BIRCHIE THUMM, female   DOB: 1935/03/27, 79 y.o.   MRN: [id number]    Chief Complaint  Patient presents with  . Medical Management of Chronic Issues   Allergies  Allergen Reactions  . Sulfonamide Derivatives Swelling    To mouth   HPI 79 y/o female patient is seen today for routine visit. She has morbid obesity, dm, OSA/OHS on CPAP, CAD with stent, severe OA confined to a wheelchair, COPD, HTN, HLD, incontinence, polymyalgia rheumatica, anxiety, depression and pulmonary embolism.    cbg this am 166 cbg ranging between 101-350 with one reading in 400s Her cpap has bee giving some problem and waiting for this to be fixed Complaint with her meds Mentions being diagnosed with vaginal polyp at [place] by Gyn recently and surgery suggested. Patient has not seen her gyn in town at present Due for dexa and mammogram  Review of Systems  Constitutional: Negative for fever, chills, diaphoresis. has gained 19 lbs since last visit. Has low energy level HENT: Negative for congestion, sore throat.   Eyes: positive for blurred vision. No eye pain. Wears reading glasses Respiratory: Negative for cough, sputum production, wheezing.  has shortness of breath with minimal exertion, stops in between sentences while talking to me Cardiovascular: Negative for chest pain, palpitations, uses 2 pillows to sleep. Denies any leg swelling.  Gastrointestinal: Negative for heartburn, nausea, vomiting, abdominal pain, diarrhea. Has constipation. takes stool softener, had a bowel movement today Genitourinary: Negative for dysuria, urgency, frequency and flank pain.  Musculoskeletal: Negative for back pain, falls. Has joint pain and myalgias.  Skin: has recurrent rash in skin folds Neurological: Negative for dizziness, tingling, focal weakness. Has been having occasional headaches, her allergy pills help her Psychiatric/Behavioral: Negative for memory loss. Has depression  Past Medical History    Diagnosis Date  . COPD (chronic obstructive pulmonary disease)     patient denies this  . Diabetes mellitus   . Diverticular disease   . Hypertension   . Hyperlipidemia   . Coronary artery disease     a. s/p IMI 2004 tx with BMS to RCA;  b. s/p Promus DES to RCA 2/12 (cath: LM 20-30%, pLAD 20-30%, mLAD 50%, RI 30%, pRCA 60%, mRCA 90% - tx with PCI);  c. myoview 1/07: EF 49%, inf scar, no isch  . Urinary incontinence   . Insomnia   . Osteoarthritis   . Morbid obesity   . Obstructive sleep apnea   . Polymyalgia rheumatica   . Anxiety   . Arthritis   . Depression   . Cholelithiasis   . CHF (congestive heart failure)   . GERD (gastroesophageal reflux disease)   . Pulmonary emboli 9/13    felt to need lifelong anticoagulation  . Debility   . Unspecified constipation   . Allergy   . Gout, unspecified   . Dysuria   . Urinary complications   . Pain, chronic   . Allergic rhinitis due to pollen   . Osteoarthrosis, unspecified whether generalized or localized, lower leg   . Type II or unspecified type diabetes mellitus without mention of complication, uncontrolled   . Anemia, unspecified   . Coronary atherosclerosis of native coronary artery   . Other malaise and fatigue    Current Outpatient Prescriptions on File Prior to Visit  Medication Sig Dispense Refill  . clonazePAM (KLONOPIN) 1 MG tablet Take one tablet in the morning and two tablets at bedtime for sleep  90 tablet  3  . colchicine (  COLCRYS) 0.6 MG tablet Take 1 tablet by mouth every day for gout  30 tablet  3  . diphenhydrAMINE (BENADRYL) 25 MG tablet Take 50 mg by mouth at bedtime as needed for itching.      . docusate sodium (COLACE) 100 MG capsule Take 100 mg by mouth 3 (three) times daily as needed for mild constipation.      . DULoxetine (CYMBALTA) 60 MG capsule Take 1 capsule by mouth every day for anxiety  30 capsule  3  . FIBER FORMULA PO Take 3 tablets by mouth daily. gummy      . furosemide (LASIX) 20 MG tablet  Take one tablet by mouth twice daily for edema  60 tablet  3  . hydrocortisone cream 1 % Apply 1 application topically daily as needed for itching.      . Insulin Glargine (TOUJEO SOLOSTAR) 300 UNIT/ML SOPN Inject 100 Units into the skin daily.  13.5 mL  4  . insulin lispro (HUMALOG) 100 UNIT/ML KiwkPen Inject 0.2 mLs (20 Units total) into the skin 2 (two) times daily before a meal.  15 mL  11  . isosorbide mononitrate (IMDUR) 60 MG 24 hr tablet Take 1 tablet (60 mg total) by mouth daily.  30 tablet  3  . metoprolol succinate (TOPROL-XL) 25 MG 24 hr tablet Take 1 tablet by mouth once every day for blood pressure  30 tablet  3  . nitroGLYCERIN (NITROSTAT) 0.4 MG SL tablet Take one tablet under the tongue every 5 minutes as needed for chest pain  50 tablet  0  . oxyCODONE-acetaminophen (PERCOCET) 10-325 MG per tablet Take one tablet every 6 hours as needed for pain  120 tablet  0  . pantoprazole (PROTONIX) 40 MG tablet Take 1 tablet by mouth every day  30 tablet  3  . phenazopyridine (PYRIDIUM) 100 MG tablet Take 1 tablet (100 mg total) by mouth 3 (three) times daily as needed for pain.  10 tablet  0  . Phenylephrine-APAP-Guaifenesin (MUCINEX SINUS-MAX CONGESTION) 5-325-200 MG TABS Take 1 tablet by mouth daily as needed (congestion).       . pravastatin (PRAVACHOL) 20 MG tablet Take 1 tablet by mouth once every day for cholesterol  30 tablet  3  . saccharomyces boulardii (FLORASTOR) 250 MG capsule Take 1 capsule (250 mg total) by mouth 2 (two) times daily.  42 capsule  0  . Sennosides (EX-LAX PO) Take 2 tablets by mouth every other day.      . warfarin (COUMADIN) 5 MG tablet Take 2.5-5 mg by mouth daily. Take 2.5mg  on Tuesday and Thursdays.  All other days take 5mg        No current facility-administered medications on file prior to visit.     Physical exam BP 154/80  Pulse 76  Temp(Src) 98.1 F (36.7 C) (Oral)  Resp 20  Ht 5\' 7"  (1.702 m)  Wt 375 lb (170.099 kg)  BMI 58.72 kg/m2  SpO2  99%  General- elderly female in no acute distress, morbidly obese Head- atraumatic, normocephalic Eyes- PERRLA, EOMI, no pallor, no icterus, no discharge Neck- no lymphadenopathy, no thyromegaly, no jugular vein distension Nose- no maxillary sinus tenderness, no frontal sinus tenderness Mouth- normal mucus membrane Cardiovascular- normal s1,s2, no murmurs Respiratory- bilateral clear to auscultation, no wheeze, no rhonchi, no crackles Abdomen- bowel sounds present, soft, non tender Musculoskeletal- able to move all 4 extremities, on wheelchair, trace leg edema  Neurological- no focal deficit Skin- warm and dry Psychiatry- alert and  oriented to person, place and time, normal mood and affect  Wt Readings from Last 3 Encounters:  02/18/14 375 lb (170.099 kg)  02/17/14 374 lb 3.2 oz (169.736 kg)  12/31/13 357 lb (161.934 kg)   Labs-  Lab Results  Component Value Date   WBC 10.0 10/14/2013   HGB 10.7* 10/14/2013   HCT 35.1* 10/14/2013   MCV 86.2 10/14/2013   PLT 299 10/14/2013   CMP     Component Value Date/Time   NA 140 12/11/2013 1158   NA 139 10/14/2013 1644   K 4.9 12/11/2013 1158   CL 100 12/11/2013 1158   CO2 24 12/11/2013 1158   GLUCOSE 121* 12/11/2013 1158   GLUCOSE 325* 10/14/2013 1644   BUN 24 12/11/2013 1158   BUN 20 10/14/2013 1644   CREATININE 1.35* 12/11/2013 1158   CREATININE 1.4* 05/13/2012   CREATININE 1.33* 10/21/2010 1519   CALCIUM 8.7 12/11/2013 1158   PROT 7.2 12/11/2013 1158   PROT 7.5 01/25/2012 1921   ALBUMIN 3.2* 01/25/2012 1921   AST 20 12/11/2013 1158   ALT 18 12/11/2013 1158   ALKPHOS 84 12/11/2013 1158   BILITOT 0.3 12/11/2013 1158   GFRNONAA 38* 12/11/2013 1158   GFRNONAA 39* 10/21/2010 1519   GFRAA 44* 12/11/2013 1158   GFRAA 47* 10/21/2010 1519   Lipid Panel     Component Value Date/Time   CHOL 136 05/13/2012   TRIG 145 05/12/2013 1112   HDL 45 05/12/2013 1112   HDL 39 05/13/2012   CHOLHDL 3.3 05/12/2013 1112   CHOLHDL 3.0 06/22/2010 0355   VLDL 28 06/22/2010 0355   LDLCALC 74 05/12/2013  1112   LDLCALC 65 05/13/2012   Lab Results  Component Value Date   HGBA1C 11.3* 12/11/2013   Assessment/plan  1. Estrogen deficiency dexa scan scheduled - DG Bone Density; Future  2. Breast cancer screening Mammogram scheduled - MM Digital Screening; Future  3. Chronic diastolic congestive heart failure Continue lasix, imdur, toprol and statin, not on ACEI with impaired renal function.  4. Essential hypertension Elevated bp in office, pt insists bp at home being normal and does not want med adjustment. Continue current regimen, meds reviewed  5. Gastroesophageal reflux disease, esophagitis presence not specified Continue pantoprazole  6. Uncontrolled diabetes mellitus with chronic kidney disease Continue tojeuo and humalog, monitor cbg, dietary compliance reinforced. Continue statin  7. Weight gain With morbid obesity and continues to gain weight. Has dm, heart disease and hyperlipidemia. Has metabolic syndrome. Non complaint with diet, limited with exercise given her severe DJD. Anticipated decline.   Advised on making appointment with her gyn for the vaginal polyp. Pt agrees

## 2014-02-19 DIAGNOSIS — Z1239 Encounter for other screening for malignant neoplasm of breast: Secondary | ICD-10-CM | POA: Insufficient documentation

## 2014-02-19 DIAGNOSIS — R635 Abnormal weight gain: Secondary | ICD-10-CM | POA: Insufficient documentation

## 2014-02-19 DIAGNOSIS — E2839 Other primary ovarian failure: Secondary | ICD-10-CM | POA: Insufficient documentation

## 2014-02-19 HISTORY — DX: Encounter for other screening for malignant neoplasm of breast: Z12.39

## 2014-02-25 ENCOUNTER — Other Ambulatory Visit: Payer: Self-pay | Admitting: Obstetrics and Gynecology

## 2014-03-02 ENCOUNTER — Other Ambulatory Visit: Payer: Self-pay | Admitting: Internal Medicine

## 2014-03-02 DIAGNOSIS — N6012 Diffuse cystic mastopathy of left breast: Secondary | ICD-10-CM

## 2014-03-05 ENCOUNTER — Other Ambulatory Visit: Payer: Medicare PPO

## 2014-03-05 DIAGNOSIS — E1129 Type 2 diabetes mellitus with other diabetic kidney complication: Secondary | ICD-10-CM

## 2014-03-05 DIAGNOSIS — E1165 Type 2 diabetes mellitus with hyperglycemia: Principal | ICD-10-CM

## 2014-03-05 DIAGNOSIS — IMO0002 Reserved for concepts with insufficient information to code with codable children: Secondary | ICD-10-CM

## 2014-03-06 LAB — COMPREHENSIVE METABOLIC PANEL
ALT: 18 IU/L (ref 0–32)
AST: 17 IU/L (ref 0–40)
Albumin/Globulin Ratio: 1 — ABNORMAL LOW (ref 1.1–2.5)
Albumin: 3.5 g/dL (ref 3.5–4.8)
Alkaline Phosphatase: 85 IU/L (ref 39–117)
BUN/Creatinine Ratio: 20 (ref 11–26)
BUN: 29 mg/dL — ABNORMAL HIGH (ref 8–27)
CO2: 25 mmol/L (ref 18–29)
Calcium: 8.5 mg/dL — ABNORMAL LOW (ref 8.7–10.3)
Chloride: 97 mmol/L (ref 97–108)
Creatinine, Ser: 1.45 mg/dL — ABNORMAL HIGH (ref 0.57–1.00)
GFR calc Af Amer: 40 mL/min/{1.73_m2} — ABNORMAL LOW (ref >59)
GFR calc non Af Amer: 35 mL/min/{1.73_m2} — ABNORMAL LOW (ref >59)
Globulin, Total: 3.6 g/dL (ref 1.5–4.5)
Glucose: 188 mg/dL — ABNORMAL HIGH (ref 65–99)
Potassium: 4.8 mmol/L (ref 3.5–5.2)
Sodium: 137 mmol/L (ref 134–144)
Total Bilirubin: 0.3 mg/dL (ref 0.0–1.2)
Total Protein: 7.1 g/dL (ref 6.0–8.5)

## 2014-03-06 LAB — HEMOGLOBIN A1C
Est. average glucose Bld gHb Est-mCnc: 255 mg/dL
Hgb A1c MFr Bld: 10.5 % — ABNORMAL HIGH (ref 4.8–5.6)

## 2014-03-09 ENCOUNTER — Ambulatory Visit (INDEPENDENT_AMBULATORY_CARE_PROVIDER_SITE_OTHER): Payer: Medicare PPO | Admitting: Pharmacotherapy

## 2014-03-09 ENCOUNTER — Encounter: Payer: Self-pay | Admitting: Pharmacotherapy

## 2014-03-09 ENCOUNTER — Ambulatory Visit (INDEPENDENT_AMBULATORY_CARE_PROVIDER_SITE_OTHER): Payer: Medicare PPO | Admitting: *Deleted

## 2014-03-09 ENCOUNTER — Ambulatory Visit: Payer: Medicare PPO | Admitting: Pharmacotherapy

## 2014-03-09 VITALS — BP 140/82 | HR 76 | Resp 10 | Ht 67.0 in | Wt 375.0 lb

## 2014-03-09 DIAGNOSIS — IMO0002 Reserved for concepts with insufficient information to code with codable children: Secondary | ICD-10-CM

## 2014-03-09 DIAGNOSIS — N183 Chronic kidney disease, stage 3 unspecified: Secondary | ICD-10-CM

## 2014-03-09 DIAGNOSIS — E1165 Type 2 diabetes mellitus with hyperglycemia: Secondary | ICD-10-CM

## 2014-03-09 DIAGNOSIS — E1122 Type 2 diabetes mellitus with diabetic chronic kidney disease: Secondary | ICD-10-CM

## 2014-03-09 DIAGNOSIS — Z23 Encounter for immunization: Secondary | ICD-10-CM

## 2014-03-09 DIAGNOSIS — I10 Essential (primary) hypertension: Secondary | ICD-10-CM

## 2014-03-09 DIAGNOSIS — N189 Chronic kidney disease, unspecified: Secondary | ICD-10-CM

## 2014-03-09 MED ORDER — PANTOPRAZOLE SODIUM 40 MG PO TBEC: DELAYED_RELEASE_TABLET | ORAL | Status: DC

## 2014-03-09 MED ORDER — ISOSORBIDE MONONITRATE ER 60 MG PO TB24: 60.0000 mg | ORAL_TABLET | Freq: Every day | ORAL | Status: DC

## 2014-03-09 MED ORDER — INSULIN LISPRO 200 UNIT/ML ~~LOC~~ SOPN: 25.0000 [IU] | PEN_INJECTOR | Freq: Two times a day (BID) | SUBCUTANEOUS | Status: DC

## 2014-03-09 MED ORDER — WARFARIN SODIUM 5 MG PO TABS: 2.5000 mg | ORAL_TABLET | Freq: Every day | ORAL | Status: DC

## 2014-03-09 MED ORDER — FUROSEMIDE 20 MG PO TABS: ORAL_TABLET | ORAL | Status: DC

## 2014-03-09 MED ORDER — METOPROLOL SUCCINATE ER 25 MG PO TB24: ORAL_TABLET | ORAL | Status: DC

## 2014-03-09 MED ORDER — CLONAZEPAM 1 MG PO TABS: ORAL_TABLET | ORAL | Status: DC

## 2014-03-09 MED ORDER — PRAVASTATIN SODIUM 20 MG PO TABS: ORAL_TABLET | ORAL | Status: DC

## 2014-03-09 MED ORDER — INSULIN GLARGINE 300 UNIT/ML ~~LOC~~ SOPN: 120.0000 [IU] | PEN_INJECTOR | Freq: Every day | SUBCUTANEOUS | Status: DC

## 2014-03-09 MED ORDER — DULOXETINE HCL 60 MG PO CPEP: ORAL_CAPSULE | ORAL | Status: DC

## 2014-03-09 NOTE — Patient Instructions (Signed)
Increase Toujeo to 120 units daily Increase Humalog to 25 units with meals.

## 2014-03-09 NOTE — Progress Notes (Signed)
  Subjective:    [name] is a 79 y.o.African American female who presents for follow-up of Type 2 diabetes mellitus.   A1C down to 10.5% from 11.6% - not at goal, but making progress. Her BG was 230 mg/dl this morning. She still struggling with diet.  She reports her average BG in the 190 range. Lowest BG 116mg /dl  Has peripheral edema. No exercise.  Limited mobility. She does have peripheral neuropathy in feet. Some blurry vision. Nocturia 4-5 times per night. Polydipsia present.  Currently on Toujeo 100 units at night. Humalog 20 units with meals.   Review of Systems A comprehensive review of systems was negative except for: Eyes: positive for blurry vision Cardiovascular: positive for lower extremity edema Genitourinary: positive for nocturia Neurological: positive for weakness and peripheral neuropathy Endocrine: positive for diabetic symptoms including blurry vision, increased fatigue, polydipsia and polyuria    Objective:    BP 140/82 mmHg  Pulse 76  Resp 10  Ht 5\' 7"  (1.702 m)  Wt 375 lb (170.099 kg)  BMI 58.72 kg/m2  SpO2 97%  General:  alert, cooperative and no distress  Oropharynx: normal findings: lips normal without lesions and gums healthy   Eyes:  negative findings: lids and lashes normal and conjunctivae and sclerae normal   Ears:  external ears normal        Lung: clear to auscultation bilaterally  Heart:  regular rate and rhythm     Extremities: edema bilateral lower extremities  Skin: dry     Neuro: mental status, speech normal, alert and oriented x3 and requires a wheelchair for ambulation   Lab Review GLUCOSE (mg/dL)  Date Value  03/05/2014 188*  12/11/2013 121*  08/11/2013 209*   GLUCOSE, BLD (mg/dL)  Date Value  10/14/2013 325*  01/19/2013 418*  07/31/2012 209*   CO2  Date Value  03/05/2014 25 mmol/L  12/11/2013 24 mmol/L  10/14/2013 26 mEq/L   BUN (mg/dL)  Date Value  03/05/2014 29*  12/11/2013 24  10/14/2013 20   08/11/2013 20  01/19/2013 20  07/31/2012 23   CREATININE (mg/dL)  Date Value  05/13/2012 1.4*   CREAT (mg/dL)  Date Value  10/21/2010 1.33*   CREATININE, SER (mg/dL)  Date Value  03/05/2014 1.45*  12/11/2013 1.35*  10/14/2013 1.15*    03/05/14: A1C:  10.5%    Assessment:    Diabetes Mellitus type II, under poor control. A1C well above target <7%, but improving. Down 2% from last year. BP at goal <140/90   Plan:    1.  Rx changes: increase Toujeo 120 units daily, increase Humalog to 25 units with each meal. 2.  Counseled on nutrition goals and meal planning.  3.  Exercise goal is 30-45 minutes 5 times per week. 4.  CKD - stable.  SCr 1.45. 5.  BP at goal <140/90 6.  Discussed weight loss options. 7.  RTC in 3 months - lab prior:  A1C, CMP, microalbumin, lipid.

## 2014-03-12 ENCOUNTER — Ambulatory Visit (INDEPENDENT_AMBULATORY_CARE_PROVIDER_SITE_OTHER): Payer: Medicare PPO | Admitting: Pharmacist Clinician (PhC)/ Clinical Pharmacy Specialist

## 2014-03-12 DIAGNOSIS — I2699 Other pulmonary embolism without acute cor pulmonale: Secondary | ICD-10-CM

## 2014-03-12 DIAGNOSIS — Z5181 Encounter for therapeutic drug level monitoring: Secondary | ICD-10-CM

## 2014-03-12 DIAGNOSIS — Z7901 Long term (current) use of anticoagulants: Secondary | ICD-10-CM

## 2014-03-12 LAB — POCT INR: INR: 2.4

## 2014-03-13 ENCOUNTER — Other Ambulatory Visit: Payer: Self-pay | Admitting: *Deleted

## 2014-03-13 DIAGNOSIS — G894 Chronic pain syndrome: Secondary | ICD-10-CM

## 2014-03-13 MED ORDER — OXYCODONE-ACETAMINOPHEN 10-325 MG PO TABS: ORAL_TABLET | ORAL | Status: DC

## 2014-03-24 ENCOUNTER — Telehealth: Payer: Self-pay | Admitting: Pulmonary Disease

## 2014-03-24 ENCOUNTER — Other Ambulatory Visit: Payer: Medicare PPO

## 2014-03-24 ENCOUNTER — Other Ambulatory Visit: Payer: Self-pay | Admitting: Internal Medicine

## 2014-03-24 ENCOUNTER — Ambulatory Visit
Admission: RE | Admit: 2014-03-24 | Discharge: 2014-03-24 | Disposition: A | Discharge status: Home / Self Care | Payer: Medicare PPO | Source: Ambulatory Visit | Attending: Internal Medicine | Admitting: Internal Medicine

## 2014-03-24 DIAGNOSIS — N6012 Diffuse cystic mastopathy of left breast: Secondary | ICD-10-CM

## 2014-03-24 DIAGNOSIS — G4733 Obstructive sleep apnea (adult) (pediatric): Secondary | ICD-10-CM

## 2014-03-24 DIAGNOSIS — N6320 Unspecified lump in the left breast, unspecified quadrant: Secondary | ICD-10-CM

## 2014-03-24 NOTE — Telephone Encounter (Signed)
lmtcb for pt.  

## 2014-03-25 NOTE — Telephone Encounter (Signed)
LMOM TCB x2

## 2014-03-26 NOTE — Telephone Encounter (Signed)
lmomtcb x 3  

## 2014-03-27 NOTE — Telephone Encounter (Signed)
Pt returned call & can be reached at [phone].  [name]

## 2014-03-27 NOTE — Telephone Encounter (Signed)
Pt states she has not used cpap in past 1-2 weeks due to it hurting her nose and causing her chest to hurt.  Pt states it causes her chest to feel like its shaking.  Pt also states she has been having more sob on exertion causing it difficult to use cpap.  Please advise.

## 2014-03-27 NOTE — Telephone Encounter (Signed)
60 day download printed and placed in your look at.  Please advise.

## 2014-03-27 NOTE — Telephone Encounter (Signed)
lmomtcb x1 for pt 

## 2014-03-27 NOTE — Telephone Encounter (Signed)
Let pt know that her download shows that she has only wore the device 11 days out of the last 60.  I will make some adjustments to the pressure, and she needs to work with her homecare company on mask fit.  If she continues to have issues, will need to get her back in to discuss further.   Please send order to dme to change her pressure to auto 5-10cm.

## 2014-03-27 NOTE — Telephone Encounter (Signed)
Would like to get a download off her device for the last 2 mos.  Hopefully can do thru airview, but if not, will need to get dme to do this for [place]. Make sure she is keeping up with cushion changes on her mask If she is having chest pain despite being off cpap, needs to call her primary ASAP.

## 2014-03-30 NOTE — Telephone Encounter (Signed)
lmomtcb x 2  

## 2014-03-31 NOTE — Telephone Encounter (Signed)
Called and spoke to pt. Informed pt of the recs per [facility]. Order placed. Pt verbalize understanding and denied any further questions or concerns at this time.

## 2014-04-07 ENCOUNTER — Inpatient Hospital Stay: Admission: RE | Admit: 2014-04-07 | Payer: Medicare PPO | Source: Ambulatory Visit

## 2014-04-10 ENCOUNTER — Ambulatory Visit (INDEPENDENT_AMBULATORY_CARE_PROVIDER_SITE_OTHER): Payer: Medicare PPO

## 2014-04-10 DIAGNOSIS — Z7901 Long term (current) use of anticoagulants: Secondary | ICD-10-CM

## 2014-04-10 DIAGNOSIS — I2699 Other pulmonary embolism without acute cor pulmonale: Secondary | ICD-10-CM

## 2014-04-10 DIAGNOSIS — Z5181 Encounter for therapeutic drug level monitoring: Secondary | ICD-10-CM

## 2014-04-10 LAB — POCT INR: INR: 2.1

## 2014-04-13 ENCOUNTER — Other Ambulatory Visit: Payer: Self-pay | Admitting: *Deleted

## 2014-04-13 DIAGNOSIS — G894 Chronic pain syndrome: Secondary | ICD-10-CM

## 2014-04-13 MED ORDER — OXYCODONE-ACETAMINOPHEN 10-325 MG PO TABS: ORAL_TABLET | ORAL | Status: DC

## 2014-04-13 NOTE — Telephone Encounter (Signed)
Patient requested and will pick up 

## 2014-04-20 ENCOUNTER — Other Ambulatory Visit: Payer: Self-pay | Admitting: Internal Medicine

## 2014-04-20 DIAGNOSIS — N6320 Unspecified lump in the left breast, unspecified quadrant: Secondary | ICD-10-CM

## 2014-04-21 ENCOUNTER — Emergency Department (HOSPITAL_COMMUNITY): Payer: Medicare PPO

## 2014-04-21 ENCOUNTER — Inpatient Hospital Stay (HOSPITAL_COMMUNITY)
Admission: EM | Admit: 2014-04-21 | Discharge: 2014-04-24 | DRG: 872 | Disposition: A | Discharge status: Home / Self Care | Payer: Medicare PPO | Attending: Internal Medicine | Admitting: Internal Medicine

## 2014-04-21 ENCOUNTER — Encounter (HOSPITAL_COMMUNITY): Payer: Self-pay | Admitting: *Deleted

## 2014-04-21 DIAGNOSIS — Z7901 Long term (current) use of anticoagulants: Secondary | ICD-10-CM

## 2014-04-21 DIAGNOSIS — I2510 Atherosclerotic heart disease of native coronary artery without angina pectoris: Secondary | ICD-10-CM | POA: Diagnosis present

## 2014-04-21 DIAGNOSIS — I509 Heart failure, unspecified: Secondary | ICD-10-CM | POA: Diagnosis present

## 2014-04-21 DIAGNOSIS — I129 Hypertensive chronic kidney disease with stage 1 through stage 4 chronic kidney disease, or unspecified chronic kidney disease: Secondary | ICD-10-CM | POA: Diagnosis present

## 2014-04-21 DIAGNOSIS — K219 Gastro-esophageal reflux disease without esophagitis: Secondary | ICD-10-CM | POA: Diagnosis present

## 2014-04-21 DIAGNOSIS — R5381 Other malaise: Secondary | ICD-10-CM | POA: Diagnosis present

## 2014-04-21 DIAGNOSIS — E785 Hyperlipidemia, unspecified: Secondary | ICD-10-CM | POA: Diagnosis present

## 2014-04-21 DIAGNOSIS — Z86711 Personal history of pulmonary embolism: Secondary | ICD-10-CM | POA: Diagnosis not present

## 2014-04-21 DIAGNOSIS — I10 Essential (primary) hypertension: Secondary | ICD-10-CM | POA: Diagnosis present

## 2014-04-21 DIAGNOSIS — Z882 Allergy status to sulfonamides status: Secondary | ICD-10-CM | POA: Diagnosis not present

## 2014-04-21 DIAGNOSIS — N183 Chronic kidney disease, stage 3 (moderate): Secondary | ICD-10-CM | POA: Diagnosis present

## 2014-04-21 DIAGNOSIS — A419 Sepsis, unspecified organism: Principal | ICD-10-CM | POA: Diagnosis present

## 2014-04-21 DIAGNOSIS — E1122 Type 2 diabetes mellitus with diabetic chronic kidney disease: Secondary | ICD-10-CM | POA: Diagnosis present

## 2014-04-21 DIAGNOSIS — I25118 Atherosclerotic heart disease of native coronary artery with other forms of angina pectoris: Secondary | ICD-10-CM | POA: Diagnosis present

## 2014-04-21 DIAGNOSIS — F329 Major depressive disorder, single episode, unspecified: Secondary | ICD-10-CM | POA: Diagnosis present

## 2014-04-21 DIAGNOSIS — N10 Acute tubulo-interstitial nephritis: Secondary | ICD-10-CM | POA: Diagnosis present

## 2014-04-21 DIAGNOSIS — R509 Fever, unspecified: Secondary | ICD-10-CM

## 2014-04-21 DIAGNOSIS — Z9884 Bariatric surgery status: Secondary | ICD-10-CM

## 2014-04-21 DIAGNOSIS — Z794 Long term (current) use of insulin: Secondary | ICD-10-CM

## 2014-04-21 DIAGNOSIS — F419 Anxiety disorder, unspecified: Secondary | ICD-10-CM | POA: Diagnosis present

## 2014-04-21 DIAGNOSIS — N390 Urinary tract infection, site not specified: Secondary | ICD-10-CM

## 2014-04-21 DIAGNOSIS — I252 Old myocardial infarction: Secondary | ICD-10-CM | POA: Diagnosis not present

## 2014-04-21 DIAGNOSIS — Z79899 Other long term (current) drug therapy: Secondary | ICD-10-CM | POA: Diagnosis not present

## 2014-04-21 DIAGNOSIS — M1990 Unspecified osteoarthritis, unspecified site: Secondary | ICD-10-CM | POA: Diagnosis present

## 2014-04-21 DIAGNOSIS — G4733 Obstructive sleep apnea (adult) (pediatric): Secondary | ICD-10-CM | POA: Diagnosis present

## 2014-04-21 DIAGNOSIS — Z951 Presence of aortocoronary bypass graft: Secondary | ICD-10-CM

## 2014-04-21 DIAGNOSIS — J449 Chronic obstructive pulmonary disease, unspecified: Secondary | ICD-10-CM | POA: Diagnosis present

## 2014-04-21 DIAGNOSIS — R531 Weakness: Secondary | ICD-10-CM | POA: Diagnosis present

## 2014-04-21 DIAGNOSIS — M109 Gout, unspecified: Secondary | ICD-10-CM | POA: Diagnosis present

## 2014-04-21 DIAGNOSIS — R52 Pain, unspecified: Secondary | ICD-10-CM | POA: Diagnosis not present

## 2014-04-21 DIAGNOSIS — Z6841 Body Mass Index (BMI) 40.0 and over, adult: Secondary | ICD-10-CM

## 2014-04-21 DIAGNOSIS — J4489 Other specified chronic obstructive pulmonary disease: Secondary | ICD-10-CM | POA: Diagnosis present

## 2014-04-21 DIAGNOSIS — E66813 Obesity, class 3: Secondary | ICD-10-CM | POA: Diagnosis present

## 2014-04-21 LAB — CBC WITH DIFFERENTIAL/PLATELET
Basophils Absolute: 0 K/uL (ref 0.0–0.1)
Basophils Relative: 0 % (ref 0–1)
Eosinophils Absolute: 0 K/uL (ref 0.0–0.7)
Eosinophils Relative: 0 % (ref 0–5)
HCT: 36.3 % (ref 36.0–46.0)
Hemoglobin: 11.4 g/dL — ABNORMAL LOW (ref 12.0–15.0)
Lymphocytes Relative: 10 % — ABNORMAL LOW (ref 12–46)
Lymphs Abs: 1.2 K/uL (ref 0.7–4.0)
MCH: 27.1 pg (ref 26.0–34.0)
MCHC: 31.4 g/dL (ref 30.0–36.0)
MCV: 86.2 fL (ref 78.0–100.0)
Monocytes Absolute: 0.2 K/uL (ref 0.1–1.0)
Monocytes Relative: 2 % — ABNORMAL LOW (ref 3–12)
Neutro Abs: 10.5 K/uL — ABNORMAL HIGH (ref 1.7–7.7)
Neutrophils Relative %: 88 % — ABNORMAL HIGH (ref 43–77)
Platelets: 265 K/uL (ref 150–400)
RBC: 4.21 MIL/uL (ref 3.87–5.11)
RDW: 14.6 % (ref 11.5–15.5)
WBC: 11.9 K/uL — ABNORMAL HIGH (ref 4.0–10.5)

## 2014-04-21 LAB — URINALYSIS, ROUTINE W REFLEX MICROSCOPIC
Bilirubin Urine: NEGATIVE
Glucose, UA: NEGATIVE mg/dL
Ketones, ur: NEGATIVE mg/dL
Nitrite: POSITIVE — AB
Protein, ur: 100 mg/dL — AB
Specific Gravity, Urine: 1.014 (ref 1.005–1.030)
Urobilinogen, UA: 0.2 mg/dL (ref 0.0–1.0)
pH: 6 (ref 5.0–8.0)

## 2014-04-21 LAB — COMPREHENSIVE METABOLIC PANEL
ALT: 29 U/L (ref 0–35)
AST: 23 U/L (ref 0–37)
Albumin: 3.4 g/dL — ABNORMAL LOW (ref 3.5–5.2)
Alkaline Phosphatase: 96 U/L (ref 39–117)
Anion gap: 17 — ABNORMAL HIGH (ref 5–15)
BUN: 19 mg/dL (ref 6–23)
CO2: 24 mEq/L (ref 19–32)
Calcium: 9.3 mg/dL (ref 8.4–10.5)
Chloride: 92 mEq/L — ABNORMAL LOW (ref 96–112)
Creatinine, Ser: 1.32 mg/dL — ABNORMAL HIGH (ref 0.50–1.10)
GFR calc Af Amer: 44 mL/min — ABNORMAL LOW (ref >90)
GFR calc non Af Amer: 38 mL/min — ABNORMAL LOW (ref >90)
Glucose, Bld: 252 mg/dL — ABNORMAL HIGH (ref 70–99)
Potassium: 4.9 mEq/L (ref 3.7–5.3)
Sodium: 133 mEq/L — ABNORMAL LOW (ref 137–147)
Total Bilirubin: 0.7 mg/dL (ref 0.3–1.2)
Total Protein: 8.4 g/dL — ABNORMAL HIGH (ref 6.0–8.3)

## 2014-04-21 LAB — I-STAT CHEM 8, ED
BUN: 20 mg/dL (ref 6–23)
Calcium, Ion: 1.07 mmol/L — ABNORMAL LOW (ref 1.13–1.30)
Chloride: 97 mEq/L (ref 96–112)
Creatinine, Ser: 1.5 mg/dL — ABNORMAL HIGH (ref 0.50–1.10)
Glucose, Bld: 259 mg/dL — ABNORMAL HIGH (ref 70–99)
HCT: 41 % (ref 36.0–46.0)
Hemoglobin: 13.9 g/dL (ref 12.0–15.0)
Potassium: 4.7 mEq/L (ref 3.7–5.3)
Sodium: 133 mEq/L — ABNORMAL LOW (ref 137–147)
TCO2: 24 mmol/L (ref 0–100)

## 2014-04-21 LAB — PROTIME-INR
INR: 2.16 — ABNORMAL HIGH (ref 0.00–1.49)
Prothrombin Time: 24.2 seconds — ABNORMAL HIGH (ref 11.6–15.2)

## 2014-04-21 LAB — URINE MICROSCOPIC-ADD ON

## 2014-04-21 LAB — GLUCOSE, CAPILLARY: Glucose-Capillary: 307 mg/dL — ABNORMAL HIGH (ref 70–99)

## 2014-04-21 LAB — MRSA PCR SCREENING: MRSA by PCR: NEGATIVE

## 2014-04-21 LAB — CBG MONITORING, ED: Glucose-Capillary: 215 mg/dL — ABNORMAL HIGH (ref 70–99)

## 2014-04-21 MED ORDER — INSULIN GLARGINE 100 UNIT/ML ~~LOC~~ SOLN
100.0000 [IU] | Freq: Every day | SUBCUTANEOUS | Status: DC
  Administered 2014-04-21: 100 [IU] via SUBCUTANEOUS
  Filled 2014-04-21 (×2): qty 1

## 2014-04-21 MED ORDER — ONDANSETRON HCL 4 MG/2ML IJ SOLN
4.0000 mg | Freq: Once | INTRAMUSCULAR | Status: AC
  Administered 2014-04-21: 4 mg via INTRAVENOUS
  Filled 2014-04-21: qty 2

## 2014-04-21 MED ORDER — COLCHICINE 0.6 MG PO TABS
0.6000 mg | ORAL_TABLET | Freq: Every day | ORAL | Status: DC
  Administered 2014-04-21 – 2014-04-24 (×4): 0.6 mg via ORAL
  Filled 2014-04-21 (×4): qty 1

## 2014-04-21 MED ORDER — SODIUM CHLORIDE 0.9 % IJ SOLN
3.0000 mL | Freq: Two times a day (BID) | INTRAMUSCULAR | Status: DC
  Administered 2014-04-22 – 2014-04-24 (×4): 3 mL via INTRAVENOUS

## 2014-04-21 MED ORDER — PANTOPRAZOLE SODIUM 40 MG PO TBEC
40.0000 mg | DELAYED_RELEASE_TABLET | Freq: Every day | ORAL | Status: DC
  Administered 2014-04-21 – 2014-04-24 (×4): 40 mg via ORAL
  Filled 2014-04-21 (×4): qty 1

## 2014-04-21 MED ORDER — SODIUM CHLORIDE 0.9 % IV SOLN
INTRAVENOUS | Status: DC
  Administered 2014-04-21: 23:00:00 via INTRAVENOUS

## 2014-04-21 MED ORDER — INSULIN ASPART 100 UNIT/ML ~~LOC~~ SOLN
0.0000 [IU] | Freq: Every day | SUBCUTANEOUS | Status: DC
Start: 2014-04-21 — End: 2014-04-24
  Administered 2014-04-21: 4 [IU] via SUBCUTANEOUS
  Administered 2014-04-22: 2 [IU] via SUBCUTANEOUS

## 2014-04-21 MED ORDER — SACCHAROMYCES BOULARDII 250 MG PO CAPS
250.0000 mg | ORAL_CAPSULE | Freq: Two times a day (BID) | ORAL | Status: DC
  Administered 2014-04-22 – 2014-04-24 (×5): 250 mg via ORAL
  Filled 2014-04-21 (×7): qty 1

## 2014-04-21 MED ORDER — ACETAMINOPHEN 325 MG PO TABS
650.0000 mg | ORAL_TABLET | Freq: Once | ORAL | Status: AC
  Administered 2014-04-21: 650 mg via ORAL
  Filled 2014-04-21: qty 2

## 2014-04-21 MED ORDER — WARFARIN SODIUM 2.5 MG PO TABS: 2.5000 mg | ORAL_TABLET | ORAL | Status: DC

## 2014-04-21 MED ORDER — IOHEXOL 300 MG/ML  SOLN
25.0000 mL | Freq: Once | INTRAMUSCULAR | Status: AC | PRN
  Administered 2014-04-21: 25 mL via ORAL

## 2014-04-21 MED ORDER — ONDANSETRON HCL 4 MG PO TABS: 4.0000 mg | ORAL_TABLET | Freq: Four times a day (QID) | ORAL | Status: DC | PRN

## 2014-04-21 MED ORDER — MORPHINE SULFATE 2 MG/ML IJ SOLN
2.0000 mg | INTRAMUSCULAR | Status: DC | PRN
Start: 2014-04-21 — End: 2014-04-24

## 2014-04-21 MED ORDER — DULOXETINE HCL 60 MG PO CPEP
60.0000 mg | ORAL_CAPSULE | Freq: Every day | ORAL | Status: DC
  Administered 2014-04-22 – 2014-04-24 (×3): 60 mg via ORAL
  Filled 2014-04-21 (×3): qty 1

## 2014-04-21 MED ORDER — DEXTROSE 5 % IV SOLN
1.0000 g | Freq: Once | INTRAVENOUS | Status: AC
  Administered 2014-04-21: 1 g via INTRAVENOUS
  Filled 2014-04-21: qty 10

## 2014-04-21 MED ORDER — WARFARIN SODIUM 5 MG PO TABS
5.0000 mg | ORAL_TABLET | ORAL | Status: DC
  Filled 2014-04-21: qty 1

## 2014-04-21 MED ORDER — SODIUM CHLORIDE 0.9 % IV BOLUS (SEPSIS)
500.0000 mL | Freq: Once | INTRAVENOUS | Status: AC
  Administered 2014-04-21: 500 mL via INTRAVENOUS

## 2014-04-21 MED ORDER — ISOSORBIDE MONONITRATE ER 60 MG PO TB24
60.0000 mg | ORAL_TABLET | Freq: Every day | ORAL | Status: DC
  Filled 2014-04-21: qty 1

## 2014-04-21 MED ORDER — PRAVASTATIN SODIUM 20 MG PO TABS
20.0000 mg | ORAL_TABLET | Freq: Every day | ORAL | Status: DC
  Administered 2014-04-22 – 2014-04-24 (×3): 20 mg via ORAL
  Filled 2014-04-21 (×3): qty 1

## 2014-04-21 MED ORDER — DOCUSATE SODIUM 100 MG PO CAPS: 100.0000 mg | ORAL_CAPSULE | Freq: Three times a day (TID) | ORAL | Status: DC | PRN

## 2014-04-21 MED ORDER — ONDANSETRON HCL 4 MG/2ML IJ SOLN: 4.0000 mg | Freq: Four times a day (QID) | INTRAMUSCULAR | Status: DC | PRN

## 2014-04-21 MED ORDER — SODIUM CHLORIDE 0.9 % IV BOLUS (SEPSIS)
1000.0000 mL | Freq: Once | INTRAVENOUS | Status: AC
  Administered 2014-04-21: 1000 mL via INTRAVENOUS

## 2014-04-21 MED ORDER — METOPROLOL SUCCINATE ER 25 MG PO TB24
25.0000 mg | ORAL_TABLET | Freq: Every day | ORAL | Status: DC
  Filled 2014-04-21: qty 1

## 2014-04-21 MED ORDER — IOHEXOL 300 MG/ML  SOLN
100.0000 mL | Freq: Once | INTRAMUSCULAR | Status: AC | PRN
  Administered 2014-04-21: 100 mL via INTRAVENOUS

## 2014-04-21 MED ORDER — DEXTROSE 5 % IV SOLN
1.0000 g | INTRAVENOUS | Status: DC
  Filled 2014-04-21: qty 10

## 2014-04-21 MED ORDER — ACETAMINOPHEN 325 MG PO TABS
650.0000 mg | ORAL_TABLET | Freq: Four times a day (QID) | ORAL | Status: DC | PRN
  Administered 2014-04-22 – 2014-04-23 (×3): 650 mg via ORAL
  Filled 2014-04-21 (×3): qty 2

## 2014-04-21 MED ORDER — ACETAMINOPHEN 650 MG RE SUPP: 650.0000 mg | Freq: Four times a day (QID) | RECTAL | Status: DC | PRN

## 2014-04-21 MED ORDER — SODIUM CHLORIDE 0.9 % IV SOLN: INTRAVENOUS | Status: DC

## 2014-04-21 MED ORDER — CEPHALEXIN 500 MG PO CAPS: 500.0000 mg | ORAL_CAPSULE | Freq: Four times a day (QID) | ORAL | Status: DC

## 2014-04-21 MED ORDER — CLONAZEPAM 1 MG PO TABS
1.0000 mg | ORAL_TABLET | Freq: Every day | ORAL | Status: DC
  Administered 2014-04-21 – 2014-04-23 (×3): 1 mg via ORAL
  Filled 2014-04-21 (×3): qty 2

## 2014-04-21 MED ORDER — HYDROCODONE-ACETAMINOPHEN 5-325 MG PO TABS: 1.0000 | ORAL_TABLET | Freq: Four times a day (QID) | ORAL | Status: DC | PRN

## 2014-04-21 MED ORDER — WARFARIN - PHARMACIST DOSING INPATIENT: Freq: Every day | Status: DC

## 2014-04-21 MED ORDER — METOPROLOL SUCCINATE ER 25 MG PO TB24: 25.0000 mg | ORAL_TABLET | Freq: Every day | ORAL | Status: DC

## 2014-04-21 MED ORDER — INSULIN ASPART 100 UNIT/ML ~~LOC~~ SOLN
0.0000 [IU] | Freq: Three times a day (TID) | SUBCUTANEOUS | Status: DC
Start: 2014-04-22 — End: 2014-04-24
  Administered 2014-04-22 (×2): 3 [IU] via SUBCUTANEOUS
  Administered 2014-04-22: 5 [IU] via SUBCUTANEOUS
  Administered 2014-04-23 (×3): 3 [IU] via SUBCUTANEOUS
  Administered 2014-04-24: 2 [IU] via SUBCUTANEOUS

## 2014-04-21 NOTE — Progress Notes (Signed)
Patient refused CPAP at this time.

## 2014-04-21 NOTE — ED Notes (Signed)
Patient with lower abdomen pain and lower back pain for 1 week.  Patient reports she has history of polyps.  Patient has intermittent bleeding.  Patient is voiding per usual.  Last bm was yesterday.  She is constipated per usual.  She denies emesis.  Patient is shivering in triage.

## 2014-04-21 NOTE — Discharge Instructions (Signed)
Follow up with your md next week. °

## 2014-04-21 NOTE — H&P (Signed)
[facility] History and Physical  [name] [id number] DOB: 12-17-1934 DOA: 04/21/2014  Referring physician: Emergency Department PCP: Blanchie Serve, MD  Specialists:   Chief Complaint: Back, abd pain  HPI: [name] is a 79 y.o. female  With a hx of DM2, COPD, HTN who presents with abd and flank pain. In the ED pt was noted to be febrile with a mildly elevated WBC of just under 12. UA was strongly suggestive of UTI. The patient was started on rocephin and the hospitalist consulted for consideration for admission.  In the ED, pt continued to complain of n/v, abd and flank pain. In ED, pt became tachycardic with hr into the 160-180's on monitor. IVF bolus was started.  Review of Systems:  Per above, the remainder of the 10pt ros reviewed and are neg  Past Medical History  Diagnosis Date  . COPD (chronic obstructive pulmonary disease)     patient denies this  . Diabetes mellitus   . Diverticular disease   . Hypertension   . Hyperlipidemia   . Coronary artery disease     a. s/p IMI 2004 tx with BMS to RCA;  b. s/p Promus DES to RCA 2/12 (cath: LM 20-30%, pLAD 20-30%, mLAD 50%, RI 30%, pRCA 60%, mRCA 90% - tx with PCI);  c. myoview 1/07: EF 49%, inf scar, no isch  . Urinary incontinence   . Insomnia   . Osteoarthritis   . Morbid obesity   . Obstructive sleep apnea   . Polymyalgia rheumatica   . Anxiety   . Arthritis   . Depression   . Cholelithiasis   . CHF (congestive heart failure)   . GERD (gastroesophageal reflux disease)   . Pulmonary emboli 9/13    felt to need lifelong anticoagulation  . Debility   . Unspecified constipation   . Allergy   . Gout, unspecified   . Dysuria   . Urinary complications   . Pain, chronic   . Allergic rhinitis due to pollen   . Osteoarthrosis, unspecified whether generalized or localized, lower leg   . Type II or unspecified type diabetes mellitus without mention of complication, uncontrolled   . Anemia,  unspecified   . Coronary atherosclerosis of native coronary artery   . Other malaise and fatigue    Past Surgical History  Procedure Laterality Date  . Coronary artery bypass graft    . Appendectomy    . Cardiac catheterization    . Gastric bypass  1977     reversed in 1979, [facility]  . Blood clots/legs and lungs  2013  . Mi with stent placement  2004   Social History:  reports that she has never smoked. She does not have any smokeless tobacco history on file. She reports that she does not drink alcohol or use illicit drugs.  where does patient live--home, ALF, SNF? and with whom if at home?  Can patient participate in ADLs?  Allergies  Allergen Reactions  . Sulfonamide Derivatives Swelling    To mouth    Family History  Problem Relation Age of Onset  . Breast cancer Mother   . Heart disease Mother   . Throat cancer Father   . Hypertension Father   . Arthritis Father   . Diabetes Father   . Arthritis Sister   . Obesity Sister   . Diabetes Sister     (be sure to complete)  Prior to Admission medications   Medication Sig Start Date End Date Taking?  Authorizing Provider  cephALEXin (KEFLEX) 500 MG capsule Take 1 capsule (500 mg total) by mouth 4 (four) times daily. 04/21/14   Maudry Diego, MD  clonazePAM (KLONOPIN) 1 MG tablet Take one tablet in the morning and two tablets at bedtime for sleep 03/09/14   Gayland Curry, DO  colchicine (COLCRYS) 0.6 MG tablet Take 1 tablet by mouth every day for gout 09/24/13   [name], NP  diphenhydrAMINE (BENADRYL) 25 MG tablet Take 50 mg by mouth at bedtime as needed for itching.    Historical Provider, MD  docusate sodium (COLACE) 100 MG capsule Take 100 mg by mouth 3 (three) times daily as needed for mild constipation.    Historical Provider, MD  DULoxetine (CYMBALTA) 60 MG capsule Take 1 capsule by mouth every day for anxiety 03/09/14   Tiffany L Reed, DO  FIBER FORMULA PO Take 3 tablets by mouth daily. gummy     Historical Provider, MD  furosemide (LASIX) 20 MG tablet Take one tablet by mouth twice daily for edema 03/09/14   Tiffany L Reed, DO  HYDROcodone-acetaminophen (NORCO/VICODIN) 5-325 MG per tablet Take 1 tablet by mouth every 6 (six) hours as needed for moderate pain. 04/21/14   Maudry Diego, MD  hydrocortisone cream 1 % Apply 1 application topically daily as needed for itching.    Historical Provider, MD  Insulin Glargine (TOUJEO SOLOSTAR) 300 UNIT/ML SOPN Inject 120 Units into the skin daily. 03/09/14   Cathey Miller, RPH-CPP  Insulin Lispro, Human, (HUMALOG KWIKPEN) 200 UNIT/ML SOPN Inject 25 Units into the skin 2 (two) times daily with a meal. 03/09/14   Tivis Ringer, RPH-CPP  isosorbide mononitrate (IMDUR) 60 MG 24 hr tablet Take 1 tablet (60 mg total) by mouth daily. 03/09/14   Tiffany L Reed, DO  metoprolol succinate (TOPROL-XL) 25 MG 24 hr tablet Take 1 tablet by mouth once every day for blood pressure 03/09/14   Tiffany L Reed, DO  nitroGLYCERIN (NITROSTAT) 0.4 MG SL tablet Take one tablet under the tongue every 5 minutes as needed for chest pain 09/24/13   [name], NP  oxyCODONE-acetaminophen (PERCOCET) 10-325 MG per tablet Take one tablet every 6 hours as needed for pain 04/13/14   [name], NP  pantoprazole (PROTONIX) 40 MG tablet Take 1 tablet by mouth every day 03/09/14   Gayland Curry, DO  phenazopyridine (PYRIDIUM) 100 MG tablet Take 1 tablet (100 mg total) by mouth 3 (three) times daily as needed for pain. 12/31/13   Blanchie Serve, MD  Phenylephrine-APAP-Guaifenesin (MUCINEX SINUS-MAX CONGESTION) 5-325-200 MG TABS Take 1 tablet by mouth daily as needed (congestion).     Historical Provider, MD  pravastatin (PRAVACHOL) 20 MG tablet Take 1 tablet by mouth once every day for cholesterol 03/09/14   Tiffany L Reed, DO  saccharomyces boulardii (FLORASTOR) 250 MG capsule Take 1 capsule (250 mg total) by mouth 2 (two) times daily. 01/02/14   Blanchie Serve, MD  Sennosides (EX-LAX  PO) Take 2 tablets by mouth every other day.    Historical Provider, MD  warfarin (COUMADIN) 5 MG tablet Take 0.5-1 tablets (2.5-5 mg total) by mouth daily. Take 2.5mg  on Tuesday and Thursdays.  All other days take 5mg  03/09/14   Gayland Curry, DO   Physical Exam: Filed Vitals:   04/21/14 1415 04/21/14 1445 04/21/14 1700 04/21/14 1723  BP: 150/60 144/47 139/89   Pulse: 93 95    Temp:    100.6 F (38.1 C)  TempSrc:  Oral  Resp: 20 25    Height:      Weight:      SpO2: 100% 98% 92%      General:  Awake, in nad  Eyes: PERRL B  ENT: Membranes dry, dentition fair  Neck: trachea midline, neck supple  Cardiovascular: tachycardic, s1, s2  Respiratory: normal resp effort, no wheezing  Abdomen: soft, obese, nondistended, pos bs  Skin: decreased skin turgor, no abnormal skin lesions seen  Musculoskeletal: perfused, no clubbing  Psychiatric: mood/affect normal// no auditory/visual halluincations  Neurologic: cn2-12 grossly intact, strength/sensation intact  Labs on Admission:  Basic Metabolic Panel:  Recent Labs Lab 04/21/14 1251 04/21/14 1321  NA 133* 133*  K 4.9 4.7  CL 92* 97  CO2 24  --   GLUCOSE 252* 259*  BUN 19 20  CREATININE 1.32* 1.50*  CALCIUM 9.3  --    Liver Function Tests:  Recent Labs Lab 04/21/14 1251  AST 23  ALT 29  ALKPHOS 96  BILITOT 0.7  PROT 8.4*  ALBUMIN 3.4*   No results for input(s): LIPASE, AMYLASE in the last 168 hours. No results for input(s): AMMONIA in the last 168 hours. CBC:  Recent Labs Lab 04/21/14 1251 04/21/14 1321  WBC 11.9*  --   NEUTROABS 10.5*  --   HGB 11.4* 13.9  HCT 36.3 41.0  MCV 86.2  --   PLT 265  --    Cardiac Enzymes: No results for input(s): CKTOTAL, CKMB, CKMBINDEX, TROPONINI in the last 168 hours.  BNP (last 3 results)  Recent Labs  10/14/13 1644  PROBNP 150.2   CBG:  Recent Labs Lab 04/21/14 1342  GLUCAP 215*    Radiological Exams on Admission: Ct Abdomen Pelvis W  Contrast  04/21/2014   CLINICAL DATA:  Abdominal and lower back pain and chronic. Chronic constipation  EXAM: CT ABDOMEN AND PELVIS WITH CONTRAST  TECHNIQUE: Multidetector CT imaging of the abdomen and pelvis was performed using the standard protocol following bolus administration of intravenous contrast. Oral contrast was also administered.  CONTRAST:  180mL OMNIPAQUE IOHEXOL 300 MG/ML  SOLN  COMPARISON:  January 25, 2012  FINDINGS: Lung bases are clear. Coronary stents noted on the right. There is a small hiatal hernia. There are surgical clips at the gastroesophageal junction, stable. Patient is status post gastric bypass procedure.  Liver is prominent, measuring 23.0 cm in length. No focal liver lesions are identified. There are multiple gallstones throughout the gallbladder. The gallbladder wall does not appear appreciably thickened, and there is no demonstrable pericholecystic fluid on this study. There is no biliary duct dilatation.  Spleen, pancreas, and right adrenal appear normal. There is a stable left adrenal mass measuring 1.6 x 1.2 cm.  There are again noted cysts in the right kidney. The cyst in the anterior aspect of the right kidney measures 4.3 by 3.3 cm. There is a cyst in the posterior upper pole right kidney measuring 4.8 x 3.8 cm. There is a third cyst measuring 1.7 x 1.5 cm in the mid right kidney posteriorly. On the left, there is a cyst anteriorly measuring 2.3 by 2.1 cm. No non cystic renal masses are appreciable. There is scarring in both kidneys. There is no hydronephrosis on either side. There is no renal or ureteral calculus on either side.  In the pelvis, the urinary bladder is decompressed with a Foley catheter. There is no pelvic mass or fluid. Appendix is absent.  There is no bowel obstruction. No free air or portal  venous air. There is postoperative change along the anterior abdominal wall with multiple clips in this area.  There is no ascites, adenopathy, or abscess in the  abdomen or pelvis. There is atherosclerotic change in the aorta but no aneurysm apparent. There is extensive arthropathy throughout the lumbar spine. There is spinal stenosis, multifactorial, at L2-3, L3-4, L4-5, and L5-S1. There are no blastic or lytic bone lesions.  IMPRESSION: Extensive lumbar spine arthropathy with multilevel spinal stenosis, multifactorial. No bowel obstruction. No abscess. Appendix absent.  There is hepatomegaly and cholelithiasis.  Multiple renal cysts on the right.  Status post gastric bypass procedure.  Small hiatal hernia.  No hydronephrosis on either side.  No renal or ureteral calculi.  Urinary bladder decompressed with Foley catheter.   Electronically Signed   By: Lowella Grip M.D.   On: 04/21/2014 16:25   Dg Chest Port 1 View  04/21/2014   CLINICAL DATA:  Shortness of breath and midportion chest pain for 2 weeks  EXAM: PORTABLE CHEST - 1 VIEW  COMPARISON:  October 14, 2013  FINDINGS: There is no edema or consolidation. Heart is upper normal in size with pulmonary vascularity within normal limits. No pneumothorax. No adenopathy. No bone lesions.  IMPRESSION: No edema or consolidation.   Electronically Signed   By: Lowella Grip M.D.   On: 04/21/2014 18:17    Assessment/Plan Principal Problem:   Sepsis secondary to UTI Active Problems:   OBESITY, MORBID   Essential hypertension   DM type 2 causing CKD stage 3   COPD (chronic obstructive pulmonary disease)   Fever   Sepsis   1. Sepsis with UTI 1. Fevers with tachycardia, leukocytosis with UTI 2. UA suggestive of active UTI 3. Continue with rocephin as tolerated 4. Follow pan cultures that were ordered in the ED 5. Admit to stepdown given septic picture 6. Fluid resuscitate 2. Sinus tachycardia 1. Likely secondary to sepsis 2. IVF as tolerated 3. Morbid obesity 1. Stable 4. DM2 1. Cont on SSI coverage 2. Cont home meds 5. COPD 1. Stable 2. No wheezing 6. Hx of PE 1. Cont coumadin per  pharmacy 2. INR 2.1 today  Code Status: Full (must indicate code status--if unknown or must be presumed, indicate so) Family Communication: Pt in room (indicate person spoken with, if applicable, with phone number if by telephone) Disposition Plan: Pending (indicate anticipated LOS)  Time spent: 59min  Cora Brierley, [facility] Pager [phone]  If 7PM-7AM, please contact night-coverage [email or web address] Password [facility] 04/21/2014, 6:26 PM

## 2014-04-21 NOTE — ED Notes (Signed)
Pt's HR much better controlled. HR in 110's

## 2014-04-21 NOTE — ED Provider Notes (Signed)
CSN: [id number]     Arrival date & time 04/21/14  1236 History   First MD Initiated Contact with Patient 04/21/14 1309     Chief Complaint  Patient presents with  . Abdominal Pain  . Back Pain  . Pelvic Pain     (Consider location/radiation/quality/duration/timing/severity/associated sxs/prior Treatment) Patient is a 79 y.o. female presenting with abdominal pain, back pain, and pelvic pain. The history is provided by the patient (pt complains of urinary frequency and pelvic pain).  Abdominal Pain Pain location:  Suprapubic Pain quality: aching   Pain radiates to:  Does not radiate Pain severity:  Moderate Onset quality:  Gradual Timing:  Constant Progression:  Waxing and waning Associated symptoms: dysuria   Associated symptoms: no chest pain, no cough, no diarrhea, no fatigue and no hematuria   Back Pain Associated symptoms: abdominal pain, dysuria and pelvic pain   Associated symptoms: no chest pain and no headaches   Pelvic Pain Associated symptoms include abdominal pain. Pertinent negatives include no chest pain and no headaches.    Past Medical History  Diagnosis Date  . COPD (chronic obstructive pulmonary disease)     patient denies this  . Diabetes mellitus   . Diverticular disease   . Hypertension   . Hyperlipidemia   . Coronary artery disease     a. s/p IMI 2004 tx with BMS to RCA;  b. s/p Promus DES to RCA 2/12 (cath: LM 20-30%, pLAD 20-30%, mLAD 50%, RI 30%, pRCA 60%, mRCA 90% - tx with PCI);  c. myoview 1/07: EF 49%, inf scar, no isch  . Urinary incontinence   . Insomnia   . Osteoarthritis   . Morbid obesity   . Obstructive sleep apnea   . Polymyalgia rheumatica   . Anxiety   . Arthritis   . Depression   . Cholelithiasis   . CHF (congestive heart failure)   . GERD (gastroesophageal reflux disease)   . Pulmonary emboli 9/13    felt to need lifelong anticoagulation  . Debility   . Unspecified constipation   . Allergy   . Gout, unspecified   .  Dysuria   . Urinary complications   . Pain, chronic   . Allergic rhinitis due to pollen   . Osteoarthrosis, unspecified whether generalized or localized, lower leg   . Type II or unspecified type diabetes mellitus without mention of complication, uncontrolled   . Anemia, unspecified   . Coronary atherosclerosis of native coronary artery   . Other malaise and fatigue    Past Surgical History  Procedure Laterality Date  . Coronary artery bypass graft    . Appendectomy    . Cardiac catheterization    . Gastric bypass  1977     reversed in 1979, [facility]  . Blood clots/legs and lungs  2013  . Mi with stent placement  2004   Family History  Problem Relation Age of Onset  . Breast cancer Mother   . Heart disease Mother   . Throat cancer Father   . Hypertension Father   . Arthritis Father   . Diabetes Father   . Arthritis Sister   . Obesity Sister   . Diabetes Sister    History  Substance Use Topics  . Smoking status: Never Smoker   . Smokeless tobacco: Not on file  . Alcohol Use: No   OB History    No data available     Review of Systems  Constitutional: Negative for appetite change and fatigue.  HENT: Negative for congestion, ear discharge and sinus pressure.   Eyes: Negative for discharge.  Respiratory: Negative for cough.   Cardiovascular: Negative for chest pain.  Gastrointestinal: Positive for abdominal pain. Negative for diarrhea.  Genitourinary: Positive for dysuria and pelvic pain. Negative for frequency and hematuria.  Musculoskeletal: Positive for back pain.  Skin: Negative for rash.  Neurological: Negative for seizures and headaches.  Psychiatric/Behavioral: Negative for hallucinations.      Allergies  Sulfonamide derivatives  Home Medications   Prior to Admission medications   Medication Sig Start Date End Date Taking? Authorizing Provider  clonazePAM (KLONOPIN) 1 MG tablet Take one tablet in the morning and two tablets at bedtime for sleep  03/09/14   Gayland Curry, DO  colchicine (COLCRYS) 0.6 MG tablet Take 1 tablet by mouth every day for gout 09/24/13   [name], NP  diphenhydrAMINE (BENADRYL) 25 MG tablet Take 50 mg by mouth at bedtime as needed for itching.    Historical Provider, MD  docusate sodium (COLACE) 100 MG capsule Take 100 mg by mouth 3 (three) times daily as needed for mild constipation.    Historical Provider, MD  DULoxetine (CYMBALTA) 60 MG capsule Take 1 capsule by mouth every day for anxiety 03/09/14   Tiffany L Reed, DO  FIBER FORMULA PO Take 3 tablets by mouth daily. gummy    Historical Provider, MD  furosemide (LASIX) 20 MG tablet Take one tablet by mouth twice daily for edema 03/09/14   Tiffany L Reed, DO  hydrocortisone cream 1 % Apply 1 application topically daily as needed for itching.    Historical Provider, MD  Insulin Glargine (TOUJEO SOLOSTAR) 300 UNIT/ML SOPN Inject 120 Units into the skin daily. 03/09/14   Cathey Miller, RPH-CPP  Insulin Lispro, Human, (HUMALOG KWIKPEN) 200 UNIT/ML SOPN Inject 25 Units into the skin 2 (two) times daily with a meal. 03/09/14   Tivis Ringer, RPH-CPP  isosorbide mononitrate (IMDUR) 60 MG 24 hr tablet Take 1 tablet (60 mg total) by mouth daily. 03/09/14   Tiffany L Reed, DO  metoprolol succinate (TOPROL-XL) 25 MG 24 hr tablet Take 1 tablet by mouth once every day for blood pressure 03/09/14   Tiffany L Reed, DO  nitroGLYCERIN (NITROSTAT) 0.4 MG SL tablet Take one tablet under the tongue every 5 minutes as needed for chest pain 09/24/13   [name], NP  oxyCODONE-acetaminophen (PERCOCET) 10-325 MG per tablet Take one tablet every 6 hours as needed for pain 04/13/14   [name], NP  pantoprazole (PROTONIX) 40 MG tablet Take 1 tablet by mouth every day 03/09/14   Gayland Curry, DO  phenazopyridine (PYRIDIUM) 100 MG tablet Take 1 tablet (100 mg total) by mouth 3 (three) times daily as needed for pain. 12/31/13   Blanchie Serve, MD  Phenylephrine-APAP-Guaifenesin  (MUCINEX SINUS-MAX CONGESTION) 5-325-200 MG TABS Take 1 tablet by mouth daily as needed (congestion).     Historical Provider, MD  pravastatin (PRAVACHOL) 20 MG tablet Take 1 tablet by mouth once every day for cholesterol 03/09/14   Tiffany L Reed, DO  saccharomyces boulardii (FLORASTOR) 250 MG capsule Take 1 capsule (250 mg total) by mouth 2 (two) times daily. 01/02/14   Blanchie Serve, MD  Sennosides (EX-LAX PO) Take 2 tablets by mouth every other day.    Historical Provider, MD  warfarin (COUMADIN) 5 MG tablet Take 0.5-1 tablets (2.5-5 mg total) by mouth daily. Take 2.5mg  on Tuesday and Thursdays.  All other days take 5mg   03/09/14   Tiffany L Reed, DO   BP 160/52 mmHg  Pulse 99  Temp(Src) 99.3 F (37.4 C) (Oral)  Resp 20  Ht 5\' 7"  (1.702 m)  Wt 360 lb (163.295 kg)  BMI 56.37 kg/m2  SpO2 99% Physical Exam  Constitutional: She is oriented to person, place, and time. She appears well-developed.  HENT:  Head: Normocephalic.  Eyes: Conjunctivae and EOM are normal. No scleral icterus.  Neck: Neck supple. No thyromegaly present.  Cardiovascular: Normal rate and regular rhythm.  Exam reveals no gallop and no friction rub.   No murmur heard. Pulmonary/Chest: No stridor. She has no wheezes. She has no rales. She exhibits no tenderness.  Abdominal: She exhibits no distension. There is tenderness. There is no rebound.  Tender suprapubic  Musculoskeletal: Normal range of motion. She exhibits no edema.  Lymphadenopathy:    She has no cervical adenopathy.  Neurological: She is oriented to person, place, and time. She exhibits normal muscle tone. Coordination normal.  Skin: No rash noted. No erythema.  Psychiatric: She has a normal mood and affect. Her behavior is normal.    ED Course  Procedures (including critical care time) Labs Review Labs Reviewed  CBC WITH DIFFERENTIAL - Abnormal; Notable for the following:    WBC 11.9 (*)    Hemoglobin 11.4 (*)    Neutrophils Relative % 88 (*)     Neutro Abs 10.5 (*)    Lymphocytes Relative 10 (*)    Monocytes Relative 2 (*)    All other components within normal limits  COMPREHENSIVE METABOLIC PANEL - Abnormal; Notable for the following:    Sodium 133 (*)    Chloride 92 (*)    Glucose, Bld 252 (*)    Creatinine, Ser 1.32 (*)    Total Protein 8.4 (*)    Albumin 3.4 (*)    GFR calc non Af Amer 38 (*)    GFR calc Af Amer 44 (*)    Anion gap 17 (*)    All other components within normal limits  URINALYSIS, ROUTINE W REFLEX MICROSCOPIC - Abnormal; Notable for the following:    APPearance TURBID (*)    Hgb urine dipstick MODERATE (*)    Protein, ur 100 (*)    Nitrite POSITIVE (*)    Leukocytes, UA LARGE (*)    All other components within normal limits  URINE MICROSCOPIC-ADD ON - Abnormal; Notable for the following:    Squamous Epithelial / LPF FEW (*)    Bacteria, UA MANY (*)    All other components within normal limits  I-STAT CHEM 8, ED - Abnormal; Notable for the following:    Sodium 133 (*)    Creatinine, Ser 1.50 (*)    Glucose, Bld 259 (*)    Calcium, Ion 1.07 (*)    All other components within normal limits  CULTURE, BLOOD (ROUTINE X 2)  CULTURE, BLOOD (ROUTINE X 2)  CBG MONITORING, ED    Imaging Review No results found.   EKG Interpretation None      MDM   Final diagnoses:  Pain    uti   tx with keflex,   Ct pending    Maudry Diego, MD 04/21/14 [phone]

## 2014-04-21 NOTE — ED Provider Notes (Signed)
Pt received at change of shift with CT A/P pending. Pt has already received IV rocephin for UTI. UC and BC are pending. CT reassuring. Pt now with rigors, +febrile. APAP given. INR and CXR added to workup. Unable to ambulate d/t weakness. Will admit. T/C to [facility]. Wyline Copas, case discussed, including:  HPI, pertinent PM/SHx, VS/PE, dx testing, ED course and treatment:  Agreeable to admit, requests to write temporary orders, obtain observation stepdown bed to team MCAdmits.   Ct Abdomen Pelvis W Contrast 04/21/2014   CLINICAL DATA:  Abdominal and lower back pain and chronic. Chronic constipation  EXAM: CT ABDOMEN AND PELVIS WITH CONTRAST  TECHNIQUE: Multidetector CT imaging of the abdomen and pelvis was performed using the standard protocol following bolus administration of intravenous contrast. Oral contrast was also administered.  CONTRAST:  168mL OMNIPAQUE IOHEXOL 300 MG/ML  SOLN  COMPARISON:  January 25, 2012  FINDINGS: Lung bases are clear. Coronary stents noted on the right. There is a small hiatal hernia. There are surgical clips at the gastroesophageal junction, stable. Patient is status post gastric bypass procedure.  Liver is prominent, measuring 23.0 cm in length. No focal liver lesions are identified. There are multiple gallstones throughout the gallbladder. The gallbladder wall does not appear appreciably thickened, and there is no demonstrable pericholecystic fluid on this study. There is no biliary duct dilatation.  Spleen, pancreas, and right adrenal appear normal. There is a stable left adrenal mass measuring 1.6 x 1.2 cm.  There are again noted cysts in the right kidney. The cyst in the anterior aspect of the right kidney measures 4.3 by 3.3 cm. There is a cyst in the posterior upper pole right kidney measuring 4.8 x 3.8 cm. There is a third cyst measuring 1.7 x 1.5 cm in the mid right kidney posteriorly. On the left, there is a cyst anteriorly measuring 2.3 by 2.1 cm. No non cystic renal masses  are appreciable. There is scarring in both kidneys. There is no hydronephrosis on either side. There is no renal or ureteral calculus on either side.  In the pelvis, the urinary bladder is decompressed with a Foley catheter. There is no pelvic mass or fluid. Appendix is absent.  There is no bowel obstruction. No free air or portal venous air. There is postoperative change along the anterior abdominal wall with multiple clips in this area.  There is no ascites, adenopathy, or abscess in the abdomen or pelvis. There is atherosclerotic change in the aorta but no aneurysm apparent. There is extensive arthropathy throughout the lumbar spine. There is spinal stenosis, multifactorial, at L2-3, L3-4, L4-5, and L5-S1. There are no blastic or lytic bone lesions.  IMPRESSION: Extensive lumbar spine arthropathy with multilevel spinal stenosis, multifactorial. No bowel obstruction. No abscess. Appendix absent.  There is hepatomegaly and cholelithiasis.  Multiple renal cysts on the right.  Status post gastric bypass procedure.  Small hiatal hernia.  No hydronephrosis on either side.  No renal or ureteral calculi.  Urinary bladder decompressed with Foley catheter.   Electronically Signed   By: Lowella Grip M.D.   On: 04/21/2014 16:25       Francine Graven, DO 04/24/14 1344

## 2014-04-21 NOTE — ED Notes (Signed)
Ordered meal tray for pt 

## 2014-04-21 NOTE — ED Notes (Signed)
Admitting MD at bedside. Pt's HR 120-150's. Started fluid bolus. Pt alert and oriented

## 2014-04-21 NOTE — Progress Notes (Signed)
ANTICOAGULATION/ANTIBIOTIC CONSULT NOTE - Initial Consult  Pharmacy Consult for Coumadin and Ceftriaxone Indication: h/o PE 2013 and UTI  Allergies  Allergen Reactions  . Sulfonamide Derivatives Swelling    To mouth    Patient Measurements: Height: 5\' 7"  (170.2 cm) Weight: (!) 360 lb (163.295 kg) IBW/kg (Calculated) : 61.6  Vital Signs: Temp: 100.6 F (38.1 C) (12/15 1723) Temp Source: Oral (12/15 1723) BP: 130/70 mmHg (12/15 1830) Pulse Rate: 114 (12/15 1830)  Labs:  Recent Labs  04/21/14 1251 04/21/14 1321  HGB 11.4* 13.9  HCT 36.3 41.0  PLT 265  --   LABPROT 24.2*  --   INR 2.16*  --   CREATININE 1.32* 1.50*    Estimated Creatinine Clearance: 50.7 mL/min (by C-G formula based on Cr of 1.5).   Medical History: Past Medical History  Diagnosis Date  . COPD (chronic obstructive pulmonary disease)     patient denies this  . Diabetes mellitus   . Diverticular disease   . Hypertension   . Hyperlipidemia   . Coronary artery disease     a. s/p IMI 2004 tx with BMS to RCA;  b. s/p Promus DES to RCA 2/12 (cath: LM 20-30%, pLAD 20-30%, mLAD 50%, RI 30%, pRCA 60%, mRCA 90% - tx with PCI);  c. myoview 1/07: EF 49%, inf scar, no isch  . Urinary incontinence   . Insomnia   . Osteoarthritis   . Morbid obesity   . Obstructive sleep apnea   . Polymyalgia rheumatica   . Anxiety   . Arthritis   . Depression   . Cholelithiasis   . CHF (congestive heart failure)   . GERD (gastroesophageal reflux disease)   . Pulmonary emboli 9/13    felt to need lifelong anticoagulation  . Debility   . Unspecified constipation   . Allergy   . Gout, unspecified   . Dysuria   . Urinary complications   . Pain, chronic   . Allergic rhinitis due to pollen   . Osteoarthrosis, unspecified whether generalized or localized, lower leg   . Type II or unspecified type diabetes mellitus without mention of complication, uncontrolled   . Anemia, unspecified   . Coronary atherosclerosis of  native coronary artery   . Other malaise and fatigue     Medications:  See electronic med rec  Assessment: 79 y.o. female presents with abd and flank pain.   AC: Pt on coumadin PTA for h/o PE 2013. Home dose:5mg  daily except for 2.5mg  on Tues and Thur. Admit INR 2.16 (therapeutic). CBC stable at baseline.  ID: Pt febrile with slightly elevated WBC 11.9. UA suggests UTI. Received 1gm Rocephin in ED ~1600. Cultures pending.  Goal of Therapy:  INR 2-3 Monitor platelets by anticoagulation protocol: Yes   Plan:  1. Daily INR 2. Coumadin 5mg  daily except for 2.5mg  on Tues and Thur 3. Rocephin 1gm IV q24h - pharmacy will sign off antibiotic dosing as no need for renal adjustments  Sherlon Handing, PharmD, BCPS Clinical pharmacist, pager [phone] 04/21/2014,7:10 PM

## 2014-04-21 NOTE — ED Notes (Signed)
Notified CT that pt has finished her contrast

## 2014-04-21 NOTE — ED Notes (Signed)
RN discussed foley catheter placement with MD and if it could be avoided. MD states pt will be admitted and will need a foley.

## 2014-04-22 ENCOUNTER — Inpatient Hospital Stay: Admission: RE | Admit: 2014-04-22 | Payer: Medicare PPO | Source: Ambulatory Visit

## 2014-04-22 DIAGNOSIS — Z86711 Personal history of pulmonary embolism: Secondary | ICD-10-CM | POA: Diagnosis present

## 2014-04-22 DIAGNOSIS — R5381 Other malaise: Secondary | ICD-10-CM

## 2014-04-22 DIAGNOSIS — N10 Acute tubulo-interstitial nephritis: Secondary | ICD-10-CM | POA: Diagnosis present

## 2014-04-22 HISTORY — DX: Personal history of pulmonary embolism: Z86.711

## 2014-04-22 LAB — GLUCOSE, CAPILLARY
Glucose-Capillary: 214 mg/dL — ABNORMAL HIGH (ref 70–99)
Glucose-Capillary: 184 mg/dL — ABNORMAL HIGH (ref 70–99)
Glucose-Capillary: 168 mg/dL — ABNORMAL HIGH (ref 70–99)
Glucose-Capillary: 222 mg/dL — ABNORMAL HIGH (ref 70–99)

## 2014-04-22 LAB — COMPREHENSIVE METABOLIC PANEL
ALT: 21 U/L (ref 0–35)
AST: 18 U/L (ref 0–37)
Albumin: 2.7 g/dL — ABNORMAL LOW (ref 3.5–5.2)
Alkaline Phosphatase: 105 U/L (ref 39–117)
Anion gap: 12 (ref 5–15)
BUN: 22 mg/dL (ref 6–23)
CO2: 25 mEq/L (ref 19–32)
Calcium: 8.4 mg/dL (ref 8.4–10.5)
Chloride: 93 mEq/L — ABNORMAL LOW (ref 96–112)
Creatinine, Ser: 1.54 mg/dL — ABNORMAL HIGH (ref 0.50–1.10)
GFR calc Af Amer: 36 mL/min — ABNORMAL LOW (ref >90)
GFR calc non Af Amer: 31 mL/min — ABNORMAL LOW (ref >90)
Glucose, Bld: 299 mg/dL — ABNORMAL HIGH (ref 70–99)
Potassium: 5.3 mEq/L (ref 3.7–5.3)
Sodium: 130 mEq/L — ABNORMAL LOW (ref 137–147)
Total Bilirubin: 0.4 mg/dL (ref 0.3–1.2)
Total Protein: 7.3 g/dL (ref 6.0–8.3)

## 2014-04-22 LAB — HEMOGLOBIN A1C
Hgb A1c MFr Bld: 11.4 % — ABNORMAL HIGH (ref <5.7)
Mean Plasma Glucose: 280 mg/dL — ABNORMAL HIGH (ref <117)

## 2014-04-22 LAB — CBC
HCT: 31.5 % — ABNORMAL LOW (ref 36.0–46.0)
Hemoglobin: 10.1 g/dL — ABNORMAL LOW (ref 12.0–15.0)
MCH: 28.1 pg (ref 26.0–34.0)
MCHC: 32.1 g/dL (ref 30.0–36.0)
MCV: 87.7 fL (ref 78.0–100.0)
Platelets: 241 10*3/uL (ref 150–400)
RBC: 3.59 MIL/uL — ABNORMAL LOW (ref 3.87–5.11)
RDW: 14.6 % (ref 11.5–15.5)
WBC: 31.9 10*3/uL — ABNORMAL HIGH (ref 4.0–10.5)

## 2014-04-22 LAB — PROTIME-INR
INR: 1.79 — ABNORMAL HIGH (ref 0.00–1.49)
Prothrombin Time: 21 seconds — ABNORMAL HIGH (ref 11.6–15.2)

## 2014-04-22 MED ORDER — METOPROLOL SUCCINATE 12.5 MG HALF TABLET
12.5000 mg | ORAL_TABLET | Freq: Every day | ORAL | Status: DC
  Administered 2014-04-22 – 2014-04-24 (×3): 12.5 mg via ORAL
  Filled 2014-04-22 (×3): qty 1

## 2014-04-22 MED ORDER — CEFTRIAXONE SODIUM IN DEXTROSE 40 MG/ML IV SOLN
2.0000 g | INTRAVENOUS | Status: DC
  Administered 2014-04-22 – 2014-04-24 (×3): 2 g via INTRAVENOUS
  Filled 2014-04-22 (×4): qty 50

## 2014-04-22 MED ORDER — INSULIN LISPRO 200 UNIT/ML ~~LOC~~ SOPN: 25.0000 [IU] | PEN_INJECTOR | Freq: Two times a day (BID) | SUBCUTANEOUS | Status: DC

## 2014-04-22 MED ORDER — INSULIN GLARGINE 100 UNIT/ML ~~LOC~~ SOLN
120.0000 [IU] | Freq: Every day | SUBCUTANEOUS | Status: DC
  Administered 2014-04-22 – 2014-04-23 (×2): 120 [IU] via SUBCUTANEOUS
  Filled 2014-04-22 (×3): qty 1.2

## 2014-04-22 MED ORDER — NITROGLYCERIN 0.4 MG SL SUBL: 0.4000 mg | SUBLINGUAL_TABLET | SUBLINGUAL | Status: DC | PRN

## 2014-04-22 MED ORDER — INSULIN ASPART 100 UNIT/ML ~~LOC~~ SOLN
25.0000 [IU] | Freq: Two times a day (BID) | SUBCUTANEOUS | Status: DC
  Administered 2014-04-22 – 2014-04-24 (×5): 25 [IU] via SUBCUTANEOUS

## 2014-04-22 MED ORDER — WARFARIN SODIUM 5 MG PO TABS
5.0000 mg | ORAL_TABLET | Freq: Once | ORAL | Status: AC
  Administered 2014-04-22: 5 mg via ORAL
  Filled 2014-04-22: qty 1

## 2014-04-22 NOTE — Progress Notes (Signed)
Patient is refusing to wear CPAP at this time. RT informed patient that if she changed her mind at any time to let RT know and we would put her on it.

## 2014-04-22 NOTE — Progress Notes (Signed)
Utilization Review Completed.Donne Anon T12/16/2015

## 2014-04-22 NOTE — Progress Notes (Signed)
Chart reviewed.   [facility] PROGRESS NOTE  [name] [id number] DOB: 03-11-1935 DOA: 04/21/2014 PCP: Blanchie Serve, MD  Assessment/Plan:  Principal Problem:   Sepsis secondary to UTI:  Blood cultures growing gram-negative rods. Continue Rocephin. Hold him to or and decrease Toprol-XL to 12-1/2 mg to avoid hypotension. Heart rate, temperature better. Monitor in step down today. Active Problems:   Hyperlipidemia   OBESITY, MORBID   Essential hypertension   CAD (coronary artery disease)   DM type 2 causing CKD stage 3, uncontrolled: Increase Lantus to home dose (120 units nightly). Also takes 25 units of lispro twice daily. Will resume short acting. Continue sliding scale. Check hemoglobin A1c   Debility: At baseline, walks with a walker or uses a wheelchair. Will get physical therapy consult.  Lives with daughter who is at home full time.   COPD (chronic obstructive pulmonary disease), stable   Acute pyelonephritis: Discontinue Foley catheter. Cultures pending.   History of pulmonary embolus (PE):  Pharmacy managing Coumadin  Antibiotics:  Ceftriaxone 04/21/14 -   HPI/Subjective: Had flank pain last night. None currently. Nausea vomiting better. Still with dysuria. Overall, feels a little better.  Objective: Filed Vitals:   04/22/14 0833  BP: 99/44  Pulse:   Temp: 97.8 F (36.6 C)  Resp:     Intake/Output Summary (Last 24 hours) at 04/22/14 0908 Last data filed at 04/22/14 0600  Gross per 24 hour  Intake   1100 ml  Output    300 ml  Net    800 ml   Filed Weights   04/21/14 1247 04/21/14 1930  Weight: 163.295 kg (360 lb) 161.7 kg (356 lb 7.7 oz)    Exam:   General:  Morbidly obese. Alert oriented and comfortable.  Cardiovascular: Regular rate rhythm without murmurs gallops rubs  Respiratory: Clear to auscultation bilaterally without wheezes rhonchi or rales  Back: No CVA tenderness  Abdomen: Obese, soft nontender nondistended  Ext: No  pitting edema. Pulses intact. No cyanosis.  Basic Metabolic Panel:  Recent Labs Lab 04/21/14 1251 04/21/14 1321 04/22/14 0323  NA 133* 133* 130*  K 4.9 4.7 5.3  CL 92* 97 93*  CO2 24  --  25  GLUCOSE 252* 259* 299*  BUN 19 20 22   CREATININE 1.32* 1.50* 1.54*  CALCIUM 9.3  --  8.4   Liver Function Tests:  Recent Labs Lab 04/21/14 1251 04/22/14 0323  AST 23 18  ALT 29 21  ALKPHOS 96 105  BILITOT 0.7 0.4  PROT 8.4* 7.3  ALBUMIN 3.4* 2.7*   No results for input(s): LIPASE, AMYLASE in the last 168 hours. No results for input(s): AMMONIA in the last 168 hours. CBC:  Recent Labs Lab 04/21/14 1251 04/21/14 1321 04/22/14 0323  WBC 11.9*  --  31.9*  NEUTROABS 10.5*  --   --   HGB 11.4* 13.9 10.1*  HCT 36.3 41.0 31.5*  MCV 86.2  --  87.7  PLT 265  --  241   Cardiac Enzymes: No results for input(s): CKTOTAL, CKMB, CKMBINDEX, TROPONINI in the last 168 hours. BNP (last 3 results)  Recent Labs  10/14/13 1644  PROBNP 150.2   CBG:  Recent Labs Lab 04/21/14 1342 04/21/14 2236  GLUCAP 215* 307*    Recent Results (from the past 240 hour(s))  Blood culture (routine x 2)     Status: None (Preliminary result)   Collection Time: 04/21/14  1:15 PM  Result Value Ref Range Status   Specimen Description BLOOD LEFT  ANTECUBITAL  Final   Special Requests BOTTLES DRAWN AEROBIC AND ANAEROBIC 5CC  Final   Culture  Setup Time   Final    04/21/2014 17:24 Performed at [facility]    Culture   Final    GRAM NEGATIVE RODS Note: Gram Stain Report Called to,Read Back By and Verified With: Teresita Madura 04/22/14 0455A [place] Performed at [facility]    Report Status PENDING  Incomplete  Blood culture (routine x 2)     Status: None (Preliminary result)   Collection Time: 04/21/14  1:20 PM  Result Value Ref Range Status   Specimen Description BLOOD RIGHT ANTECUBITAL  Final   Special Requests BOTTLES DRAWN AEROBIC AND ANAEROBIC 10CC  Final   Culture  Setup  Time   Final    04/21/2014 17:23 Performed at [facility]    Culture   Final    GRAM NEGATIVE RODS Note: Gram Stain Report Called to,Read Back By and Verified With: Teresita Madura 04/22/14 0455A [place] Performed at [facility]    Report Status PENDING  Incomplete  MRSA PCR Screening     Status: None   Collection Time: 04/21/14  7:56 PM  Result Value Ref Range Status   MRSA by PCR NEGATIVE NEGATIVE Final    Comment:        The GeneXpert MRSA Assay (FDA approved for NASAL specimens only), is one component of a comprehensive MRSA colonization surveillance program. It is not intended to diagnose MRSA infection nor to guide or monitor treatment for MRSA infections.      Studies: Ct Abdomen Pelvis W Contrast  04/21/2014   CLINICAL DATA:  Abdominal and lower back pain and chronic. Chronic constipation  EXAM: CT ABDOMEN AND PELVIS WITH CONTRAST  TECHNIQUE: Multidetector CT imaging of the abdomen and pelvis was performed using the standard protocol following bolus administration of intravenous contrast. Oral contrast was also administered.  CONTRAST:  17mL OMNIPAQUE IOHEXOL 300 MG/ML  SOLN  COMPARISON:  January 25, 2012  FINDINGS: Lung bases are clear. Coronary stents noted on the right. There is a small hiatal hernia. There are surgical clips at the gastroesophageal junction, stable. Patient is status post gastric bypass procedure.  Liver is prominent, measuring 23.0 cm in length. No focal liver lesions are identified. There are multiple gallstones throughout the gallbladder. The gallbladder wall does not appear appreciably thickened, and there is no demonstrable pericholecystic fluid on this study. There is no biliary duct dilatation.  Spleen, pancreas, and right adrenal appear normal. There is a stable left adrenal mass measuring 1.6 x 1.2 cm.  There are again noted cysts in the right kidney. The cyst in the anterior aspect of the right kidney measures 4.3 by 3.3 cm.  There is a cyst in the posterior upper pole right kidney measuring 4.8 x 3.8 cm. There is a third cyst measuring 1.7 x 1.5 cm in the mid right kidney posteriorly. On the left, there is a cyst anteriorly measuring 2.3 by 2.1 cm. No non cystic renal masses are appreciable. There is scarring in both kidneys. There is no hydronephrosis on either side. There is no renal or ureteral calculus on either side.  In the pelvis, the urinary bladder is decompressed with a Foley catheter. There is no pelvic mass or fluid. Appendix is absent.  There is no bowel obstruction. No free air or portal venous air. There is postoperative change along the anterior abdominal wall with multiple clips in this area.  There is  no ascites, adenopathy, or abscess in the abdomen or pelvis. There is atherosclerotic change in the aorta but no aneurysm apparent. There is extensive arthropathy throughout the lumbar spine. There is spinal stenosis, multifactorial, at L2-3, L3-4, L4-5, and L5-S1. There are no blastic or lytic bone lesions.  IMPRESSION: Extensive lumbar spine arthropathy with multilevel spinal stenosis, multifactorial. No bowel obstruction. No abscess. Appendix absent.  There is hepatomegaly and cholelithiasis.  Multiple renal cysts on the right.  Status post gastric bypass procedure.  Small hiatal hernia.  No hydronephrosis on either side.  No renal or ureteral calculi.  Urinary bladder decompressed with Foley catheter.   Electronically Signed   By: Lowella Grip M.D.   On: 04/21/2014 16:25   Dg Chest Port 1 View  04/21/2014   CLINICAL DATA:  Shortness of breath and midportion chest pain for 2 weeks  EXAM: PORTABLE CHEST - 1 VIEW  COMPARISON:  October 14, 2013  FINDINGS: There is no edema or consolidation. Heart is upper normal in size with pulmonary vascularity within normal limits. No pneumothorax. No adenopathy. No bone lesions.  IMPRESSION: No edema or consolidation.   Electronically Signed   By: Lowella Grip M.D.   On:  04/21/2014 18:17    Scheduled Meds: . cefTRIAXone (ROCEPHIN) IVPB 1 gram/50 mL D5W  1 g Intravenous Q24H  . clonazePAM  1 mg Oral Daily  . colchicine  0.6 mg Oral Daily  . DULoxetine  60 mg Oral Daily  . insulin aspart  0-15 Units Subcutaneous TID WC  . insulin aspart  0-5 Units Subcutaneous QHS  . insulin glargine  120 Units Subcutaneous QHS  . Insulin Lispro (Human)  25 Units Subcutaneous BID WC  . metoprolol succinate  12.5 mg Oral Daily  . pantoprazole  40 mg Oral Daily  . pravastatin  20 mg Oral Daily  . saccharomyces boulardii  250 mg Oral BID  . sodium chloride  3 mL Intravenous Q12H  . warfarin  5 mg Oral ONCE-1800  . Warfarin - Pharmacist Dosing Inpatient   Does not apply q1800   Continuous Infusions: . sodium chloride 75 mL/hr at 04/21/14 2321    Time spent: 35 minutes  [facility] [email or web address], password [facility] 04/22/2014, 9:08 AM  LOS: 1 day

## 2014-04-22 NOTE — Progress Notes (Signed)
CRITICAL VALUE ALERT  Critical value received:  Two aerobic and one anaerobic blood culture bottle are positive for gram pos rods  Date of notification:  04/22/14  Time of notification:  0500  Critical value read back:Yes.    Nurse who received alert:  Teresita Madura RN  MD notified (1st page):  Baltazar Najjar NP  Time of first page:  0500  MD notified (2nd page):  Time of second page:  Responding MD:  Baltazar Najjar NP  Time MD responded:  38

## 2014-04-22 NOTE — Progress Notes (Signed)
Inpatient Diabetes Program Recommendations  AACE/ADA: New Consensus Statement on Inpatient Glycemic Control (2013)  Target Ranges:  Prepandial:   less than 140 mg/dL      Peak postprandial:   less than 180 mg/dL (1-2 hours)      Critically ill patients:  140 - 180 mg/dL   Reason for Assessment:  Results for [name], [name] (MRN [id number]) as of 04/22/2014 11:02  Ref. Range 04/21/2014 13:42 04/21/2014 22:36  Glucose-Capillary Latest Range: 70-99 mg/dL 215 (H) 307 (H)   Diabetes history: Type 2 diabetes with CKD stage 3 Outpatient Diabetes medications: Toujeo (Glargine) 100 units q HS, Humalog U-200- 25 units bid with meals Current orders for Inpatient glycemic control:  Lantus 120 units q HS (increased today to home dose per MD), Novolog 25 units bid with meals (added today), and Novolog moderate tid with meals  Agree with changes today.  Will follow.  Thanks, Adah Perl, RN, BC-ADM Inpatient Diabetes Coordinator Pager [phone]

## 2014-04-22 NOTE — Progress Notes (Addendum)
ANTICOAGULATION CONSULT NOTE - Follow Up Consult  Pharmacy Consult for coumadin Indication: hx PE (2013)  Allergies  Allergen Reactions  . Sulfonamide Derivatives Swelling    To mouth    Patient Measurements: Height: 5\' 7"  (170.2 cm) Weight: (!) 356 lb 7.7 oz (161.7 kg) IBW/kg (Calculated) : 61.6   Vital Signs: Temp: 97.8 F (36.6 C) (12/16 0833) Temp Source: Oral (12/16 0833) BP: 99/44 mmHg (12/16 0833) Pulse Rate: 88 (12/16 0400)  Labs:  Recent Labs  04/21/14 1251 04/21/14 1321 04/22/14 0323  HGB 11.4* 13.9 10.1*  HCT 36.3 41.0 31.5*  PLT 265  --  241  LABPROT 24.2*  --  21.0*  INR 2.16*  --  1.79*  CREATININE 1.32* 1.50* 1.54*    Estimated Creatinine Clearance: 49.1 mL/min (by C-G formula based on Cr of 1.54).   Medications:  Scheduled:  . cefTRIAXone (ROCEPHIN) IVPB 1 gram/50 mL D5W  1 g Intravenous Q24H  . clonazePAM  1 mg Oral Daily  . colchicine  0.6 mg Oral Daily  . DULoxetine  60 mg Oral Daily  . insulin aspart  0-15 Units Subcutaneous TID WC  . insulin aspart  0-5 Units Subcutaneous QHS  . insulin glargine  100 Units Subcutaneous QHS  . metoprolol succinate  12.5 mg Oral Daily  . pantoprazole  40 mg Oral Daily  . pravastatin  20 mg Oral Daily  . saccharomyces boulardii  250 mg Oral BID  . sodium chloride  3 mL Intravenous Q12H  . [START ON 04/23/2014] warfarin  2.5 mg Oral Once per day on Tue Thu  . warfarin  5 mg Oral Once per day on Sun Mon Wed Fri Sat  . Warfarin - Pharmacist Dosing Inpatient   Does not apply q1800    Assessment: 79 yo female here with sepsis/UTI and on coumadin for history of PE (per history to continue lifelong anticogulation).  INR today is 1.79.  Home coumadin dose:  5mg  daily except for 2.5mg  on Tues and Thur  Goal of Therapy:  INR 2-3 Monitor platelets by anticoagulation protocol: Yes   Plan:  -Coumadin 5mg  po today -Daily PT/INR  Hildred Laser, Pharm D 04/22/2014 9:06 AM   Addendum: Patient currently  receiving Ceftriaxone 1g daily for urosepsis. With weight 162kg and 2/2 BCx reporting GNR, will increase Ceftriaxone dose to 2g daily. - Afeb, WBC up to 31.9  Plan: 1. Increase Ceftriaxone to 2g IV daily 2. Follow-up cultures and susceptibilities  Janina Mayo, PharmD Clinical Pharmacist [phone] 04/22/2014, 11:11 AM

## 2014-04-23 LAB — BASIC METABOLIC PANEL
Anion gap: 13 (ref 5–15)
BUN: 21 mg/dL (ref 6–23)
CO2: 25 mEq/L (ref 19–32)
Calcium: 8.6 mg/dL (ref 8.4–10.5)
Chloride: 100 mEq/L (ref 96–112)
Creatinine, Ser: 1.42 mg/dL — ABNORMAL HIGH (ref 0.50–1.10)
GFR calc Af Amer: 40 mL/min — ABNORMAL LOW (ref >90)
GFR calc non Af Amer: 35 mL/min — ABNORMAL LOW (ref >90)
Glucose, Bld: 225 mg/dL — ABNORMAL HIGH (ref 70–99)
Potassium: 5.2 mEq/L (ref 3.7–5.3)
Sodium: 138 mEq/L (ref 137–147)

## 2014-04-23 LAB — CBC WITH DIFFERENTIAL/PLATELET
Basophils Absolute: 0 10*3/uL (ref 0.0–0.1)
Basophils Relative: 0 % (ref 0–1)
Eosinophils Absolute: 0.2 10*3/uL (ref 0.0–0.7)
Eosinophils Relative: 1 % (ref 0–5)
HCT: 30.8 % — ABNORMAL LOW (ref 36.0–46.0)
Hemoglobin: 9.7 g/dL — ABNORMAL LOW (ref 12.0–15.0)
Lymphocytes Relative: 12 % (ref 12–46)
Lymphs Abs: 2.1 10*3/uL (ref 0.7–4.0)
MCH: 27.2 pg (ref 26.0–34.0)
MCHC: 31.5 g/dL (ref 30.0–36.0)
MCV: 86.3 fL (ref 78.0–100.0)
Monocytes Absolute: 1.2 10*3/uL — ABNORMAL HIGH (ref 0.1–1.0)
Monocytes Relative: 7 % (ref 3–12)
Neutro Abs: 13.6 10*3/uL — ABNORMAL HIGH (ref 1.7–7.7)
Neutrophils Relative %: 80 % — ABNORMAL HIGH (ref 43–77)
Platelets: 225 10*3/uL (ref 150–400)
RBC: 3.57 MIL/uL — ABNORMAL LOW (ref 3.87–5.11)
RDW: 14.6 % (ref 11.5–15.5)
WBC: 17.1 10*3/uL — ABNORMAL HIGH (ref 4.0–10.5)

## 2014-04-23 LAB — GLUCOSE, CAPILLARY
Glucose-Capillary: 168 mg/dL — ABNORMAL HIGH (ref 70–99)
Glucose-Capillary: 167 mg/dL — ABNORMAL HIGH (ref 70–99)
Glucose-Capillary: 161 mg/dL — ABNORMAL HIGH (ref 70–99)
Glucose-Capillary: 187 mg/dL — ABNORMAL HIGH (ref 70–99)

## 2014-04-23 LAB — PROTIME-INR
INR: 1.4 (ref 0.00–1.49)
Prothrombin Time: 17.3 seconds — ABNORMAL HIGH (ref 11.6–15.2)

## 2014-04-23 MED ORDER — WARFARIN SODIUM 7.5 MG PO TABS
7.5000 mg | ORAL_TABLET | Freq: Once | ORAL | Status: AC
  Administered 2014-04-23: 7.5 mg via ORAL
  Filled 2014-04-23 (×2): qty 1

## 2014-04-23 MED ORDER — ENOXAPARIN SODIUM 150 MG/ML ~~LOC~~ SOLN
160.0000 mg | Freq: Two times a day (BID) | SUBCUTANEOUS | Status: DC
  Administered 2014-04-23 (×2): 160 mg via SUBCUTANEOUS
  Filled 2014-04-23 (×4): qty 2

## 2014-04-23 MED ORDER — PHENAZOPYRIDINE HCL 200 MG PO TABS
200.0000 mg | ORAL_TABLET | Freq: Three times a day (TID) | ORAL | Status: DC
  Administered 2014-04-23 – 2014-04-24 (×4): 200 mg via ORAL
  Filled 2014-04-23 (×7): qty 1

## 2014-04-23 MED ORDER — CLONAZEPAM 1 MG PO TABS: 2.0000 mg | ORAL_TABLET | Freq: Every day | ORAL | Status: DC

## 2014-04-23 MED ORDER — CLONAZEPAM 0.5 MG PO TABS
0.5000 mg | ORAL_TABLET | Freq: Once | ORAL | Status: AC
  Administered 2014-04-23: 0.5 mg via ORAL
  Filled 2014-04-23: qty 1

## 2014-04-23 MED ORDER — ISOSORBIDE MONONITRATE ER 60 MG PO TB24
60.0000 mg | ORAL_TABLET | Freq: Every day | ORAL | Status: DC
  Administered 2014-04-23 – 2014-04-24 (×2): 60 mg via ORAL
  Filled 2014-04-23 (×2): qty 1

## 2014-04-23 MED ORDER — ENOXAPARIN SODIUM 150 MG/ML ~~LOC~~ SOLN
160.0000 mg | Freq: Once | SUBCUTANEOUS | Status: AC
  Filled 2014-04-23 (×3): qty 2

## 2014-04-23 NOTE — Progress Notes (Signed)
Report called to Jonni Sanger, RN on 6E.  Updated on patient history, current status, and plan of care.  Patient is stable and able to transfer at this time.

## 2014-04-23 NOTE — Evaluation (Signed)
Physical Therapy Evaluation Patient Details Name: [name] MRN: [id number] DOB: 08/14/1934 Today's Date: 04/23/2014   History of Present Illness  79 y.o. female presenting with abdominal pain, back pain, and pelvic pain in setting of possible UTI.  Clinical Impression  Patient demonstrates deficits in functional mobility as indicated below. Will need continued skilled PT to address deficits and maximize function. Will see as indicated and progress as tolerated.  Extensive education performed with patient and family regarding safe techniques for transfers and mobility. Educated patient on risks of current technique.  Spoke with patient and family at length regarding modifications for ease of care at home. Educated patient on importance of control and breathing during activity. OF NOTE: patient with dizziness and pain in left eye upon sitting, BP assessed and elevate 160s/110s.  After rest returned to 150/80.  Patient with with elevated HR 130s during activity 70s at rest.    Follow Up Recommendations Home health PT;Supervision/Assistance - 24 hour    Equipment Recommendations  None recommended by PT    Recommendations for Other Services       Precautions / Restrictions Precautions Precautions: Fall Restrictions Weight Bearing Restrictions: No      Mobility  Bed Mobility Overal bed mobility: Needs Assistance Bed Mobility: Supine to Sit     Supine to sit: Mod assist     General bed mobility comments: heavy reliance on rails and pad for hip rotation, significant time to perform, Max cues for controlled breathing, + dizziness upon initial sitting  Transfers Overall transfer level: Needs assistance Equipment used: Rolling walker (2 wheeled) Transfers: Sit to/from Stand Sit to Stand: Mod assist         General transfer comment: moderate assist provided by grandson, poor technique, education [id number] and positioning reviewed with patient and family members. Advised  againt pulling on outstretched hand to come to upright, recommended close proximity during transfer and rocker technique with push off from bed.  Ambulation/Gait Ambulation/Gait assistance: Min assist Ambulation Distance (Feet): 10 Feet Assistive device: Rolling walker (2 wheeled) Gait Pattern/deviations: Step-to pattern;Decreased stride length;Drifts right/left;Wide base of support;Shuffle;Antalgic Gait velocity: decreased Gait velocity interpretation: <1.8 ft/sec, indicative of risk for recurrent falls General Gait Details: max cues for safety  Stairs            Wheelchair Mobility    Modified Rankin (Stroke Patients Only)       Balance                                             Pertinent Vitals/Pain Pain Assessment: 0-10 Pain Score: 6  Pain Location: low back Pain Descriptors / Indicators: Constant;Discomfort Pain Intervention(s): Limited activity within patient's tolerance;Monitored during session;Repositioned    Home Living Family/patient expects to be discharged to:: Private residence Living Arrangements: Children Available Help at Discharge: Family;Available 24 hours/day Type of Home: House Home Access: Ramped entrance     Home Layout: Two level;Able to live on main level with bedroom/bathroom Home Equipment: Shower seat;Walker - 2 wheels;Wheelchair - manual;Bedside commode      Prior Function Level of Independence: Needs assistance         Comments: patient reports use of RW in the house, assist from family, wheel chair use predominantly     Hand Dominance   Dominant Hand: Right    Extremity/Trunk Assessment  Lower Extremity Assessment: Generalized weakness;LLE deficits/detail (limited range due to soft tissue approximation)   LLE Deficits / Details: LE buckling upon strength assessment     Communication   Communication: No difficulties  Cognition Arousal/Alertness: Awake/alert Behavior During  Therapy: WFL for tasks assessed/performed Overall Cognitive Status: Within Functional Limits for tasks assessed                      General Comments      Exercises        Assessment/Plan    PT Assessment Patient needs continued PT services  PT Diagnosis Difficulty walking;Generalized weakness;Acute pain   PT Problem List Decreased strength;Decreased range of motion;Decreased activity tolerance;Decreased balance;Decreased mobility;Decreased safety awareness;Cardiopulmonary status limiting activity;Obesity;Pain  PT Treatment Interventions DME instruction;Gait training;Functional mobility training;Therapeutic activities;Therapeutic exercise;Balance training;Patient/family education   PT Goals (Current goals can be found in the Care Plan section) Acute Rehab PT Goals Patient Stated Goal: to return home PT Goal Formulation: With patient/family Time For Goal Achievement: 05/07/14 Potential to Achieve Goals: Fair    Frequency Min 3X/week   Barriers to discharge        Co-evaluation               End of Session Equipment Utilized During Treatment: Gait belt;Oxygen Activity Tolerance: Patient limited by fatigue Patient left: in chair;with call bell/phone within reach;with family/visitor present Nurse Communication: Mobility status         Time: [id number] PT Time Calculation (min) (ACUTE ONLY): 28 min   Charges:   PT Evaluation $Initial PT Evaluation Tier I: 1 Procedure PT Treatments $Therapeutic Activity: 8-22 mins $Self Care/Home Management: 8-22   PT G CodesDuncan Dull 04/23/2014, 1:45 PM Alben Deeds, [place] DPT  [phone]

## 2014-04-23 NOTE — Progress Notes (Signed)
[facility] PROGRESS NOTE  [name] [id number] DOB: 1934-09-14 DOA: 04/21/2014 PCP: Blanchie Serve, MD  Assessment/Plan:  Principal Problem:   Sepsis secondary to UTI:  Blood and urine cultures growing E coli. Continue Rocephin. No further hypotension. Transfer to [employer] Active Problems:   Hyperlipidemia   OBESITY, MORBID   Essential hypertension: resume imdur   CAD (coronary artery disease):    DM type 2 causing CKD stage 3, uncontrolled: Increase Lantus to home dose (120 units nightly). Also takes 25 units of lispro twice daily. Hgb a1c >11. Will adjust as needed   Debility: At baseline, walks with a walker or uses a wheelchair. PT working with pt now.  Lives with daughter who is at home full time.   COPD (chronic obstructive pulmonary disease), stable   Acute pyelonephritis:    History of pulmonary embolus (PE):  Pharmacy managing Coumadin. Subtherapeutic.   Antibiotics:  Ceftriaxone 04/21/14 -   HPI/Subjective: Couldn't sleep. Takes 2 mg klonipin nightly, not ordered here.  C/o dysuria, frequency. Eating ok  Objective: Filed Vitals:   04/23/14 0815  BP: 124/49  Pulse: 71  Temp: 98.3 F (36.8 C)  Resp: 21    Intake/Output Summary (Last 24 hours) at 04/23/14 1116 Last data filed at 04/23/14 1016  Gross per 24 hour  Intake    293 ml  Output   1900 ml  Net  -1607 ml   Filed Weights   04/21/14 1247 04/21/14 1930  Weight: 163.295 kg (360 lb) 161.7 kg (356 lb 7.7 oz)    Exam:   General:  Morbidly obese. Alert oriented and comfortable.  Cardiovascular: Regular rate rhythm without murmurs gallops rubs  Respiratory: Clear to auscultation bilaterally without wheezes rhonchi or rales  Back: No CVA tenderness  Abdomen: Obese, soft nontender nondistended  Ext: No pitting edema. Pulses intact. No cyanosis.  Basic Metabolic Panel:  Recent Labs Lab 04/21/14 1251 04/21/14 1321 04/22/14 0323 04/23/14 0300  NA 133* 133* 130* 138  K 4.9  4.7 5.3 5.2  CL 92* 97 93* 100  CO2 24  --  25 25  GLUCOSE 252* 259* 299* 225*  BUN 19 20 22 21   CREATININE 1.32* 1.50* 1.54* 1.42*  CALCIUM 9.3  --  8.4 8.6   Liver Function Tests:  Recent Labs Lab 04/21/14 1251 04/22/14 0323  AST 23 18  ALT 29 21  ALKPHOS 96 105  BILITOT 0.7 0.4  PROT 8.4* 7.3  ALBUMIN 3.4* 2.7*   No results for input(s): LIPASE, AMYLASE in the last 168 hours. No results for input(s): AMMONIA in the last 168 hours. CBC:  Recent Labs Lab 04/21/14 1251 04/21/14 1321 04/22/14 0323 04/23/14 0300  WBC 11.9*  --  31.9* 17.1*  NEUTROABS 10.5*  --   --  13.6*  HGB 11.4* 13.9 10.1* 9.7*  HCT 36.3 41.0 31.5* 30.8*  MCV 86.2  --  87.7 86.3  PLT 265  --  241 225   Cardiac Enzymes: No results for input(s): CKTOTAL, CKMB, CKMBINDEX, TROPONINI in the last 168 hours. BNP (last 3 results)  Recent Labs  10/14/13 1644  PROBNP 150.2   CBG:  Recent Labs Lab 04/22/14 0842 04/22/14 1226 04/22/14 1716 04/22/14 2216 04/23/14 0811  GLUCAP 214* 184* 168* 222* 168*    Recent Results (from the past 240 hour(s))  Blood culture (routine x 2)     Status: None (Preliminary result)   Collection Time: 04/21/14  1:15 PM  Result Value Ref Range Status  Specimen Description BLOOD LEFT ANTECUBITAL  Final   Special Requests BOTTLES DRAWN AEROBIC AND ANAEROBIC 5CC  Final   Culture  Setup Time   Final    04/21/2014 17:24 Performed at [facility]    Culture   Final    ESCHERICHIA COLI Note: Gram Stain Report Called to,Read Back By and Verified With: Teresita Madura 04/22/14 0455A [place] Performed at [facility]    Report Status PENDING  Incomplete  Blood culture (routine x 2)     Status: None (Preliminary result)   Collection Time: 04/21/14  1:20 PM  Result Value Ref Range Status   Specimen Description BLOOD RIGHT ANTECUBITAL  Final   Special Requests BOTTLES DRAWN AEROBIC AND ANAEROBIC 10CC  Final   Culture  Setup Time   Final    04/21/2014  17:23 Performed at [facility]    Culture   Final    ESCHERICHIA COLI Note: Gram Stain Report Called to,Read Back By and Verified With: Teresita Madura 04/22/14 0455A [place] Performed at [facility]    Report Status PENDING  Incomplete  Urine culture     Status: None (Preliminary result)   Collection Time: 04/21/14  1:53 PM  Result Value Ref Range Status   Specimen Description URINE, CATHETERIZED  Final   Special Requests ADDED [id number]  Final   Culture  Setup Time   Final    04/22/2014 04:37 Performed at [place]   Final    >=100,000 COLONIES/ML Performed at [facility]    Culture   Final    ESCHERICHIA COLI Performed at [facility]    Report Status PENDING  Incomplete  MRSA PCR Screening     Status: None   Collection Time: 04/21/14  7:56 PM  Result Value Ref Range Status   MRSA by PCR NEGATIVE NEGATIVE Final    Comment:        The GeneXpert MRSA Assay (FDA approved for NASAL specimens only), is one component of a comprehensive MRSA colonization surveillance program. It is not intended to diagnose MRSA infection nor to guide or monitor treatment for MRSA infections.      Studies: Ct Abdomen Pelvis W Contrast  04/21/2014   CLINICAL DATA:  Abdominal and lower back pain and chronic. Chronic constipation  EXAM: CT ABDOMEN AND PELVIS WITH CONTRAST  TECHNIQUE: Multidetector CT imaging of the abdomen and pelvis was performed using the standard protocol following bolus administration of intravenous contrast. Oral contrast was also administered.  CONTRAST:  137mL OMNIPAQUE IOHEXOL 300 MG/ML  SOLN  COMPARISON:  January 25, 2012  FINDINGS: Lung bases are clear. Coronary stents noted on the right. There is a small hiatal hernia. There are surgical clips at the gastroesophageal junction, stable. Patient is status post gastric bypass procedure.  Liver is prominent, measuring 23.0 cm in length. No focal liver lesions  are identified. There are multiple gallstones throughout the gallbladder. The gallbladder wall does not appear appreciably thickened, and there is no demonstrable pericholecystic fluid on this study. There is no biliary duct dilatation.  Spleen, pancreas, and right adrenal appear normal. There is a stable left adrenal mass measuring 1.6 x 1.2 cm.  There are again noted cysts in the right kidney. The cyst in the anterior aspect of the right kidney measures 4.3 by 3.3 cm. There is a cyst in the posterior upper pole right kidney measuring 4.8 x 3.8 cm. There is a third cyst measuring 1.7  x 1.5 cm in the mid right kidney posteriorly. On the left, there is a cyst anteriorly measuring 2.3 by 2.1 cm. No non cystic renal masses are appreciable. There is scarring in both kidneys. There is no hydronephrosis on either side. There is no renal or ureteral calculus on either side.  In the pelvis, the urinary bladder is decompressed with a Foley catheter. There is no pelvic mass or fluid. Appendix is absent.  There is no bowel obstruction. No free air or portal venous air. There is postoperative change along the anterior abdominal wall with multiple clips in this area.  There is no ascites, adenopathy, or abscess in the abdomen or pelvis. There is atherosclerotic change in the aorta but no aneurysm apparent. There is extensive arthropathy throughout the lumbar spine. There is spinal stenosis, multifactorial, at L2-3, L3-4, L4-5, and L5-S1. There are no blastic or lytic bone lesions.  IMPRESSION: Extensive lumbar spine arthropathy with multilevel spinal stenosis, multifactorial. No bowel obstruction. No abscess. Appendix absent.  There is hepatomegaly and cholelithiasis.  Multiple renal cysts on the right.  Status post gastric bypass procedure.  Small hiatal hernia.  No hydronephrosis on either side.  No renal or ureteral calculi.  Urinary bladder decompressed with Foley catheter.   Electronically Signed   By: Lowella Grip  M.D.   On: 04/21/2014 16:25   Dg Chest Port 1 View  04/21/2014   CLINICAL DATA:  Shortness of breath and midportion chest pain for 2 weeks  EXAM: PORTABLE CHEST - 1 VIEW  COMPARISON:  October 14, 2013  FINDINGS: There is no edema or consolidation. Heart is upper normal in size with pulmonary vascularity within normal limits. No pneumothorax. No adenopathy. No bone lesions.  IMPRESSION: No edema or consolidation.   Electronically Signed   By: Lowella Grip M.D.   On: 04/21/2014 18:17    Scheduled Meds: . cefTRIAXone (ROCEPHIN)  IV  2 g Intravenous Q24H  . clonazePAM  1 mg Oral Daily  . colchicine  0.6 mg Oral Daily  . DULoxetine  60 mg Oral Daily  . enoxaparin (LOVENOX) injection  160 mg Subcutaneous Once  . enoxaparin (LOVENOX) injection  160 mg Subcutaneous Q12H  . insulin aspart  0-15 Units Subcutaneous TID WC  . insulin aspart  0-5 Units Subcutaneous QHS  . insulin aspart  25 Units Subcutaneous BID WC  . insulin glargine  120 Units Subcutaneous QHS  . metoprolol succinate  12.5 mg Oral Daily  . pantoprazole  40 mg Oral Daily  . pravastatin  20 mg Oral Daily  . saccharomyces boulardii  250 mg Oral BID  . sodium chloride  3 mL Intravenous Q12H  . warfarin  7.5 mg Oral ONCE-1800  . Warfarin - Pharmacist Dosing Inpatient   Does not apply q1800   Continuous Infusions: . sodium chloride 75 mL/hr at 04/21/14 2321    Time spent: 35 minutes  [facility] [email or web address], password [facility] 04/23/2014, 11:16 AM  LOS: 2 days

## 2014-04-23 NOTE — Progress Notes (Addendum)
ANTICOAGULATION CONSULT NOTE - Follow Up Consult  Pharmacy Consult for coumadin Indication: hx PE  Allergies  Allergen Reactions  . Sulfonamide Derivatives Swelling    To mouth    Patient Measurements: Height: 5\' 7"  (170.2 cm) Weight: (!) 356 lb 7.7 oz (161.7 kg) IBW/kg (Calculated) : 61.6   Vital Signs: Temp: 98.3 F (36.8 C) (12/17 0815) Temp Source: Oral (12/17 0815) BP: 124/49 mmHg (12/17 0815) Pulse Rate: 71 (12/17 0815)  Labs:  Recent Labs  04/21/14 1251 04/21/14 1321 04/22/14 0323 04/23/14 0300  HGB 11.4* 13.9 10.1* 9.7*  HCT 36.3 41.0 31.5* 30.8*  PLT 265  --  241 225  LABPROT 24.2*  --  21.0* 17.3*  INR 2.16*  --  1.79* 1.40  CREATININE 1.32* 1.50* 1.54* 1.42*    Estimated Creatinine Clearance: 53.2 mL/min (by C-G formula based on Cr of 1.42).   Medications:  Scheduled:  . cefTRIAXone (ROCEPHIN)  IV  2 g Intravenous Q24H  . clonazePAM  1 mg Oral Daily  . colchicine  0.6 mg Oral Daily  . DULoxetine  60 mg Oral Daily  . insulin aspart  0-15 Units Subcutaneous TID WC  . insulin aspart  0-5 Units Subcutaneous QHS  . insulin aspart  25 Units Subcutaneous BID WC  . insulin glargine  120 Units Subcutaneous QHS  . metoprolol succinate  12.5 mg Oral Daily  . pantoprazole  40 mg Oral Daily  . pravastatin  20 mg Oral Daily  . saccharomyces boulardii  250 mg Oral BID  . sodium chloride  3 mL Intravenous Q12H  . Warfarin - Pharmacist Dosing Inpatient   Does not apply q1800    Assessment: 79 yo female here with sepsis/UTI and on coumadin for history of PE (per history to continue lifelong anticogulation). INR today is 1.4 with trend down (INR at goal at admission) - unclear why INR is falling.  Home coumadin dose: 5mg  daily except for 2.5mg  on Tues and Thur  Goal of Therapy:  INR 2-3 Monitor platelets by anticoagulation protocol: Yes   Plan:  -Coumadin 7.5mg  po today -Daily PT/INR -Could consider lovenox or heparin bridge with low INR  Hildred Laser, Pharm D 04/23/2014 8:25 AM   Addendum: lovenox -Spoke with Dr. Conley Canal and will begin lovenox bridge -Hg= 9.7 with down trend but likely dilutional with fluids (discussed with MD) -SCr= 1.42 and CrCl ~ 50  Plan -Lovenox 160mg  [place] q12h -CBC in am then q3days  Hildred Laser, Pharm D 04/23/2014 10:38 AM

## 2014-04-23 NOTE — Progress Notes (Signed)
Attempted to call report to Cybil, RN on 6E.  RN unavailable at this time.  Will try again shortly.  Patient remains stable at this time.  Will continue to monitor.

## 2014-04-24 LAB — CBC
HCT: 30.2 % — ABNORMAL LOW (ref 36.0–46.0)
Hemoglobin: 9.5 g/dL — ABNORMAL LOW (ref 12.0–15.0)
MCH: 27.5 pg (ref 26.0–34.0)
MCHC: 31.5 g/dL (ref 30.0–36.0)
MCV: 87.3 fL (ref 78.0–100.0)
Platelets: 221 10*3/uL (ref 150–400)
RBC: 3.46 MIL/uL — ABNORMAL LOW (ref 3.87–5.11)
RDW: 14.5 % (ref 11.5–15.5)
WBC: 9.4 10*3/uL (ref 4.0–10.5)

## 2014-04-24 LAB — GLUCOSE, CAPILLARY
Glucose-Capillary: 76 mg/dL (ref 70–99)
Glucose-Capillary: 135 mg/dL — ABNORMAL HIGH (ref 70–99)

## 2014-04-24 LAB — CULTURE, BLOOD (ROUTINE X 2)

## 2014-04-24 LAB — URINE CULTURE: Colony Count: >=100000

## 2014-04-24 LAB — PROTIME-INR
INR: 1.39 (ref 0.00–1.49)
Prothrombin Time: 17.2 seconds — ABNORMAL HIGH (ref 11.6–15.2)

## 2014-04-24 MED ORDER — WARFARIN SODIUM 7.5 MG PO TABS
7.5000 mg | ORAL_TABLET | Freq: Once | ORAL | Status: DC
  Filled 2014-04-24: qty 1

## 2014-04-24 MED ORDER — WARFARIN SODIUM 5 MG PO TABS: 2.5000 mg | ORAL_TABLET | Freq: Every day | ORAL | Status: DC

## 2014-04-24 MED ORDER — ACETAMINOPHEN 325 MG PO TABS: 650.0000 mg | ORAL_TABLET | Freq: Four times a day (QID) | ORAL | Status: DC | PRN

## 2014-04-24 MED ORDER — CEFUROXIME AXETIL 500 MG PO TABS: 500.0000 mg | ORAL_TABLET | Freq: Two times a day (BID) | ORAL | Status: DC

## 2014-04-24 NOTE — Progress Notes (Signed)
ANTICOAGULATION CONSULT NOTE - Follow Up Consult  Pharmacy Consult for coumadin Indication: hx PE  Allergies  Allergen Reactions  . Sulfonamide Derivatives Swelling    To mouth    Patient Measurements: Height: 5\' 7"  (170.2 cm) Weight: (!) 356 lb 7.7 oz (161.7 kg) IBW/kg (Calculated) : 61.6   Vital Signs: Temp: 98.4 F (36.9 C) (12/18 0450) Temp Source: Oral (12/18 0450) BP: 133/53 mmHg (12/18 0450) Pulse Rate: 68 (12/18 0450)  Labs:  Recent Labs  04/21/14 1321 04/22/14 0323 04/23/14 0300 04/24/14 0510  HGB 13.9 10.1* 9.7* 9.5*  HCT 41.0 31.5* 30.8* 30.2*  PLT  --  241 225 221  LABPROT  --  21.0* 17.3* 17.2*  INR  --  1.79* 1.40 1.39  CREATININE 1.50* 1.54* 1.42*  --     Estimated Creatinine Clearance: 53.2 mL/min (by C-G formula based on Cr of 1.42).   Medications:  Scheduled:  . cefTRIAXone (ROCEPHIN)  IV  2 g Intravenous Q24H  . clonazePAM  2 mg Oral QHS  . colchicine  0.6 mg Oral Daily  . DULoxetine  60 mg Oral Daily  . enoxaparin (LOVENOX) injection  160 mg Subcutaneous Q12H  . insulin aspart  0-15 Units Subcutaneous TID WC  . insulin aspart  0-5 Units Subcutaneous QHS  . insulin aspart  25 Units Subcutaneous BID WC  . insulin glargine  120 Units Subcutaneous QHS  . isosorbide mononitrate  60 mg Oral Daily  . metoprolol succinate  12.5 mg Oral Daily  . pantoprazole  40 mg Oral Daily  . phenazopyridine  200 mg Oral TID WC  . pravastatin  20 mg Oral Daily  . saccharomyces boulardii  250 mg Oral BID  . sodium chloride  3 mL Intravenous Q12H  . Warfarin - Pharmacist Dosing Inpatient   Does not apply q1800    Assessment: 79 yo female here with sepsis/UTI and on coumadin for history of PE (per history to continue lifelong anticogulation). INR today is 1.39 with trend down (INR at goal at admission) - unclear why INR is falling.  Home coumadin dose: 5mg  daily except for 2.5mg  on Tues and Thur  CBC stable.  Hgb low likely dilutional.  Renal function  stable, Crcl 50-55, lytes wnl.  No bleeding noted.    Goal of Therapy:  INR 2-3 Monitor platelets by anticoagulation protocol: Yes   Plan:  - Coumadin 7.5mg  x 1 tonight  - Continue Lovenox 160mg  [place] q12h (at least 5 days until INR therapeutic x 24 hours) - CBC in am then q3days - Monitor for signs and symptoms of bleeding  Hassie Bruce, Pharm. D. Clinical Pharmacy Resident Pager: [phone] Ph: [phone] 04/24/2014 8:41 AM

## 2014-04-24 NOTE — Discharge Summary (Signed)
Physician Discharge Summary  [name] [id number] DOB: 12-27-1934 DOA: 04/21/2014  PCP: Blanchie Serve, MD  Admit date: 04/21/2014 Discharge date: 04/24/2014  Time spent: greater than 30 minutes  Recommendations for Outpatient Follow-up:  1. Optimize diabetes control 2. Monitor INR and adjust coumadin.  Discharge Diagnoses:  Principal Problem:   Sepsis secondary to UTI Active Problems:   Hyperlipidemia   OBESITY, MORBID   Essential hypertension   CAD (coronary artery disease)   DM type 2 causing CKD stage 3, unconttolled   Debility   COPD (chronic obstructive pulmonary disease)   Acute pyelonephritis   History of pulmonary embolus (PE)   Discharge Condition: stable  Filed Weights   04/21/14 1247 04/21/14 1930  Weight: 163.295 kg (360 lb) 161.7 kg (356 lb 7.7 oz)    History of present illness:  79 y.o. female  With a hx of DM2, COPD, HTN who presents with abd and flank pain. In the ED pt was noted to be febrile with a mildly elevated WBC of just under 12. UA consistent with  UTI. The patient was started on rocephin in ED.  In the ED, pt continued to complain of n/v, abd and flank pain. In ED, pt became tachycardic with hr into the 160-180's on monitor. IVF bolus was started.  Hospital Course:  Admitted to stepdown.  Fluid rescussitated. Rocephin continued.  Antihypertensives held, as patient was initially hypotensive.  Blood and urine cultures growing E coli, sensitive to most.   Blood pressure, tachycardia, nausea, weakness and pain improved  Blood sugars uncontrolled on admission.. Hgb a1c >11. Insulin adjusted    Debility: At baseline, walks with a walker or uses a wheelchair. PT worked with patient.    History of pulmonary embolus (PE): Pharmacy managing Coumadin. Subtherapeutic on admission, but reports having run out of coumadin a few days prior to admission.   Procedures:  none  Consultations:  none  Discharge Exam: Filed Vitals:    04/24/14 0955  BP: 138/53  Pulse: 72  Temp: 98.2 F (36.8 C)  Resp: 17    General: nontoxic. comfortable Cardiovascular: RRR Respiratory: CTA Back no CVA tenderness abd s, nd, nt   Discharge Instructions    Activity as tolerated - No restrictions    Complete by:  As directed      Diet - low sodium heart healthy    Complete by:  As directed      Diet Carb Modified    Complete by:  As directed      Discharge instructions    Complete by:  As directed   Change Trujeo to 120 units injection nightly          Current Discharge Medication List    START taking these medications   Details  acetaminophen (TYLENOL) 325 MG tablet Take 2 tablets (650 mg total) by mouth every 6 (six) hours as needed for mild pain (or Fever >/= 101).    cefUROXime (CEFTIN) 500 MG tablet Take 1 tablet (500 mg total) by mouth 2 (two) times daily with a meal. Qty: 14 tablet, Refills: 0      CONTINUE these medications which have CHANGED   Details  warfarin (COUMADIN) 5 MG tablet Take 0.5-1 tablets (2.5-5 mg total) by mouth daily. Takes 7.5mg  on Tuesday, Thursday, and Saturday.  All other days take 5mg  Qty: 30 tablet, Refills: 1      CONTINUE these medications which have NOT CHANGED   Details  clonazePAM (KLONOPIN) 1 MG tablet Take one tablet  in the morning and two tablets at bedtime for sleep Qty: 90 tablet, Refills: 3    colchicine (COLCRYS) 0.6 MG tablet Take 1 tablet by mouth every day for gout Qty: 30 tablet, Refills: 3   Associated Diagnoses: Gout    diphenhydrAMINE (BENADRYL) 25 MG tablet Take 50 mg by mouth at bedtime as needed for itching.    docusate sodium (COLACE) 100 MG capsule Take 100 mg by mouth 3 (three) times daily as needed for mild constipation.    DULoxetine (CYMBALTA) 60 MG capsule Take 1 capsule by mouth every day for anxiety Qty: 90 capsule, Refills: 1    FIBER FORMULA PO Take 3 tablets by mouth daily. gummy    furosemide (LASIX) 20 MG tablet Take one tablet by mouth  twice daily for edema Qty: 180 tablet, Refills: 1    hydrocortisone cream 1 % Apply 1 application topically daily as needed for itching.    Insulin Glargine (TOUJEO SOLOSTAR) 300 UNIT/ML SOPN Inject 120 Units into the skin daily. Qty: 15 mL, Refills: 6   Associated Diagnoses: Uncontrolled diabetes mellitus with chronic kidney disease    Insulin Lispro, Human, (HUMALOG KWIKPEN) 200 UNIT/ML SOPN Inject 25 Units into the skin 2 (two) times daily with a meal. Qty: 12 mL, Refills: 6   Associated Diagnoses: Uncontrolled diabetes mellitus with chronic kidney disease    isosorbide mononitrate (IMDUR) 60 MG 24 hr tablet Take 1 tablet (60 mg total) by mouth daily. Qty: 90 tablet, Refills: 1    metoprolol succinate (TOPROL-XL) 25 MG 24 hr tablet Take 1 tablet by mouth once every day for blood pressure Qty: 90 tablet, Refills: 1    nitroGLYCERIN (NITROSTAT) 0.4 MG SL tablet Take one tablet under the tongue every 5 minutes as needed for chest pain Qty: 50 tablet, Refills: 0    oxyCODONE-acetaminophen (PERCOCET) 10-325 MG per tablet Take one tablet every 6 hours as needed for pain Qty: 120 tablet, Refills: 0   Associated Diagnoses: Chronic pain syndrome    pantoprazole (PROTONIX) 40 MG tablet Take 1 tablet by mouth every day Qty: 90 tablet, Refills: 1    Phenylephrine-APAP-Guaifenesin (MUCINEX SINUS-MAX CONGESTION) 5-325-200 MG TABS Take 1 tablet by mouth daily as needed (congestion).     pravastatin (PRAVACHOL) 20 MG tablet Take 1 tablet by mouth once every day for cholesterol Qty: 90 tablet, Refills: 1    saccharomyces boulardii (FLORASTOR) 250 MG capsule Take 1 capsule (250 mg total) by mouth 2 (two) times daily. Qty: 42 capsule, Refills: 0    Sennosides (EX-LAX PO) Take 2 tablets by mouth every other day.      STOP taking these medications     phenazopyridine (PYRIDIUM) 100 MG tablet        Allergies  Allergen Reactions  . Sulfonamide Derivatives Swelling    To mouth    Follow-up Information    Follow up with Blanchie Serve, MD.   Specialty:  Internal Medicine   Why:  As needed   Contact information:   [place] [address] [phone]        The results of significant diagnostics from this hospitalization (including imaging, microbiology, ancillary and laboratory) are listed below for reference.    Significant Diagnostic Studies: Ct Abdomen Pelvis W Contrast  04/21/2014   CLINICAL DATA:  Abdominal and lower back pain and chronic. Chronic constipation  EXAM: CT ABDOMEN AND PELVIS WITH CONTRAST  TECHNIQUE: Multidetector CT imaging of the abdomen and pelvis was performed using the standard  protocol following bolus administration of intravenous contrast. Oral contrast was also administered.  CONTRAST:  153mL OMNIPAQUE IOHEXOL 300 MG/ML  SOLN  COMPARISON:  January 25, 2012  FINDINGS: Lung bases are clear. Coronary stents noted on the right. There is a small hiatal hernia. There are surgical clips at the gastroesophageal junction, stable. Patient is status post gastric bypass procedure.  Liver is prominent, measuring 23.0 cm in length. No focal liver lesions are identified. There are multiple gallstones throughout the gallbladder. The gallbladder wall does not appear appreciably thickened, and there is no demonstrable pericholecystic fluid on this study. There is no biliary duct dilatation.  Spleen, pancreas, and right adrenal appear normal. There is a stable left adrenal mass measuring 1.6 x 1.2 cm.  There are again noted cysts in the right kidney. The cyst in the anterior aspect of the right kidney measures 4.3 by 3.3 cm. There is a cyst in the posterior upper pole right kidney measuring 4.8 x 3.8 cm. There is a third cyst measuring 1.7 x 1.5 cm in the mid right kidney posteriorly. On the left, there is a cyst anteriorly measuring 2.3 by 2.1 cm. No non cystic renal masses are appreciable. There is scarring in both kidneys. There is no  hydronephrosis on either side. There is no renal or ureteral calculus on either side.  In the pelvis, the urinary bladder is decompressed with a Foley catheter. There is no pelvic mass or fluid. Appendix is absent.  There is no bowel obstruction. No free air or portal venous air. There is postoperative change along the anterior abdominal wall with multiple clips in this area.  There is no ascites, adenopathy, or abscess in the abdomen or pelvis. There is atherosclerotic change in the aorta but no aneurysm apparent. There is extensive arthropathy throughout the lumbar spine. There is spinal stenosis, multifactorial, at L2-3, L3-4, L4-5, and L5-S1. There are no blastic or lytic bone lesions.  IMPRESSION: Extensive lumbar spine arthropathy with multilevel spinal stenosis, multifactorial. No bowel obstruction. No abscess. Appendix absent.  There is hepatomegaly and cholelithiasis.  Multiple renal cysts on the right.  Status post gastric bypass procedure.  Small hiatal hernia.  No hydronephrosis on either side.  No renal or ureteral calculi.  Urinary bladder decompressed with Foley catheter.   Electronically Signed   By: Lowella Grip M.D.   On: 04/21/2014 16:25   Dg Chest Port 1 View  04/21/2014   CLINICAL DATA:  Shortness of breath and midportion chest pain for 2 weeks  EXAM: PORTABLE CHEST - 1 VIEW  COMPARISON:  October 14, 2013  FINDINGS: There is no edema or consolidation. Heart is upper normal in size with pulmonary vascularity within normal limits. No pneumothorax. No adenopathy. No bone lesions.  IMPRESSION: No edema or consolidation.   Electronically Signed   By: Lowella Grip M.D.   On: 04/21/2014 18:17    Microbiology: Recent Results (from the past 240 hour(s))  Blood culture (routine x 2)     Status: None   Collection Time: 04/21/14  1:15 PM  Result Value Ref Range Status   Specimen Description BLOOD LEFT ANTECUBITAL  Final   Special Requests BOTTLES DRAWN AEROBIC AND ANAEROBIC 5CC  Final    Culture  Setup Time   Final    04/21/2014 17:24 Performed at [facility]    Culture   Final    ESCHERICHIA COLI Note: SUSCEPTIBILITIES PERFORMED ON PREVIOUS CULTURE WITHIN THE LAST 5 DAYS. Note: Gram Stain Report Called to,Read  Back By and Verified With: [facility] PORTER 04/22/14 0455A [place] Performed at [facility]    Report Status 04/24/2014 FINAL  Final  Blood culture (routine x 2)     Status: None   Collection Time: 04/21/14  1:20 PM  Result Value Ref Range Status   Specimen Description BLOOD RIGHT ANTECUBITAL  Final   Special Requests BOTTLES DRAWN AEROBIC AND ANAEROBIC 10CC  Final   Culture  Setup Time   Final    04/21/2014 17:23 Performed at [facility]    Culture   Final    ESCHERICHIA COLI Note: Gram Stain Report Called to,Read Back By and Verified With: Teresita Madura 04/22/14 0455A [place] Performed at [facility]    Report Status 04/24/2014 FINAL  Final   Organism ID, Bacteria ESCHERICHIA COLI  Final      Susceptibility   Escherichia coli - MIC*    AMPICILLIN <=2 SENSITIVE Sensitive     AMPICILLIN/SULBACTAM <=2 SENSITIVE Sensitive     CEFAZOLIN <=4 SENSITIVE Sensitive     CEFEPIME <=1 SENSITIVE Sensitive     CEFTAZIDIME <=1 SENSITIVE Sensitive     CEFTRIAXONE <=1 SENSITIVE Sensitive     CIPROFLOXACIN <=0.25 SENSITIVE Sensitive     GENTAMICIN <=1 SENSITIVE Sensitive     IMIPENEM <=0.25 SENSITIVE Sensitive     PIP/TAZO <=4 SENSITIVE Sensitive     TOBRAMYCIN <=1 SENSITIVE Sensitive     TRIMETH/SULFA >=320 RESISTANT Resistant     * ESCHERICHIA COLI  Urine culture     Status: None   Collection Time: 04/21/14  1:53 PM  Result Value Ref Range Status   Specimen Description URINE, CATHETERIZED  Final   Special Requests ADDED [id number]  Final   Culture  Setup Time   Final    04/22/2014 04:37 Performed at [place]   Final    >=100,000 COLONIES/ML Performed at [facility]    Culture   Final     ESCHERICHIA COLI Performed at [facility]    Report Status 04/24/2014 FINAL  Final   Organism ID, Bacteria ESCHERICHIA COLI  Final      Susceptibility   Escherichia coli - MIC*    AMPICILLIN <=2 SENSITIVE Sensitive     CEFAZOLIN <=4 SENSITIVE Sensitive     CEFTRIAXONE <=1 SENSITIVE Sensitive     CIPROFLOXACIN <=0.25 SENSITIVE Sensitive     GENTAMICIN <=1 SENSITIVE Sensitive     LEVOFLOXACIN <=0.12 SENSITIVE Sensitive     NITROFURANTOIN <=16 SENSITIVE Sensitive     TOBRAMYCIN <=1 SENSITIVE Sensitive     TRIMETH/SULFA >=320 RESISTANT Resistant     PIP/TAZO <=4 SENSITIVE Sensitive     * ESCHERICHIA COLI  MRSA PCR Screening     Status: None   Collection Time: 04/21/14  7:56 PM  Result Value Ref Range Status   MRSA by PCR NEGATIVE NEGATIVE Final    Comment:        The GeneXpert MRSA Assay (FDA approved for NASAL specimens only), is one component of a comprehensive MRSA colonization surveillance program. It is not intended to diagnose MRSA infection nor to guide or monitor treatment for MRSA infections.      Labs: Basic Metabolic Panel:  Recent Labs Lab 04/21/14 1251 04/21/14 1321 04/22/14 0323 04/23/14 0300  NA 133* 133* 130* 138  K 4.9 4.7 5.3 5.2  CL 92* 97 93* 100  CO2 24  --  25 25  GLUCOSE 252* 259* 299* 225*  BUN 19 20 22 21   CREATININE 1.32* 1.50* 1.54* 1.42*  CALCIUM 9.3  --  8.4 8.6   Liver Function Tests:  Recent Labs Lab 04/21/14 1251 04/22/14 0323  AST 23 18  ALT 29 21  ALKPHOS 96 105  BILITOT 0.7 0.4  PROT 8.4* 7.3  ALBUMIN 3.4* 2.7*   No results for input(s): LIPASE, AMYLASE in the last 168 hours. No results for input(s): AMMONIA in the last 168 hours. CBC:  Recent Labs Lab 04/21/14 1251 04/21/14 1321 04/22/14 0323 04/23/14 0300 04/24/14 0510  WBC 11.9*  --  31.9* 17.1* 9.4  NEUTROABS 10.5*  --   --  13.6*  --   HGB 11.4* 13.9 10.1* 9.7* 9.5*  HCT 36.3 41.0 31.5* 30.8* 30.2*  MCV 86.2  --  87.7 86.3 87.3  PLT 265   --  241 225 221   Cardiac Enzymes: No results for input(s): CKTOTAL, CKMB, CKMBINDEX, TROPONINI in the last 168 hours. BNP: BNP (last 3 results)  Recent Labs  10/14/13 1644  PROBNP 150.2   CBG:  Recent Labs Lab 04/23/14 0811 04/23/14 1219 04/23/14 1710 04/23/14 2036 04/24/14 0759  GLUCAP 168* 167* 161* 187* 76       Signed:  Michaelyn Wall L  [facility] 04/24/2014, 1:56 PM

## 2014-04-24 NOTE — Progress Notes (Signed)
Pt discharged home with daughter and granddaughter. Prescriptions given. Follow- up appointments discussed. Pt. Verbalized signs and symptoms of worsening conditions and when to call the doctor.   Penni Bombard, RN 3:59 PM 04/24/2014

## 2014-04-24 NOTE — Progress Notes (Signed)
Pt still refusing CPAP.  Pt aware to notify RT should she change her mind.

## 2014-04-24 NOTE — Care Management Note (Signed)
CARE MANAGEMENT NOTE 04/24/2014  Patient:  [name], [name]   Account Number:  [id number]  Date Initiated:  04/24/2014  Documentation initiated by:  Aliviyah Malanga  Subjective/Objective Assessment:   CM following for progression and d/c planning.     Action/Plan:   04/24/2014 Met with pt and family, IM given will follow for d/c needs.   Anticipated DC Date:  04/26/2014   Anticipated DC Plan:  [place]         Choice offered to / List presented to:             Status of service:  In process, will continue to follow Medicare Important Message given?  YES (If response is "NO", the following Medicare IM given date fields will be blank) Date Medicare IM given:  04/24/2014 Medicare IM given by:  Ambar Raphael Date Additional Medicare IM given:   Additional Medicare IM given by:    Discharge Disposition:    Per UR Regulation:    If discussed at Long Length of Stay Meetings, dates discussed:    Comments:

## 2014-04-24 NOTE — Care Management Note (Signed)
CARE MANAGEMENT NOTE 04/24/2014  Patient:  [name], [name]   Account Number:  [id number]  Date Initiated:  04/24/2014  Documentation initiated by:  Beni Turrell  Subjective/Objective Assessment:   CM following for progression and d/c planning.     Action/Plan:   04/24/2014 Met with pt and family, IM given will follow for d/c needs.  [place] arranged for pt who chose [facility] for HHPT   Anticipated DC Date:  04/24/2014   Anticipated DC Plan:  [place]         [facility] Choice  HOME HEALTH   Choice offered to / List presented to:  C-1 Patient        [place] arranged  [place] [facility]      [place].   Status of service:  Completed, signed off Medicare Important Message given?  YES (If response is "NO", the following Medicare IM given date fields will be blank) Date Medicare IM given:  04/24/2014 Medicare IM given by:  Adja Ruff Date Additional Medicare IM given:   Additional Medicare IM given by:    Discharge Disposition:  [place]  Per UR Regulation:    If discussed at Long Length of Stay Meetings, dates discussed:    Comments:

## 2014-04-28 DIAGNOSIS — A419 Sepsis, unspecified organism: Secondary | ICD-10-CM

## 2014-04-28 DIAGNOSIS — I10 Essential (primary) hypertension: Secondary | ICD-10-CM

## 2014-04-28 DIAGNOSIS — N390 Urinary tract infection, site not specified: Secondary | ICD-10-CM

## 2014-04-28 DIAGNOSIS — E1165 Type 2 diabetes mellitus with hyperglycemia: Secondary | ICD-10-CM

## 2014-05-05 ENCOUNTER — Ambulatory Visit (INDEPENDENT_AMBULATORY_CARE_PROVIDER_SITE_OTHER): Payer: Medicare PPO | Admitting: Internal Medicine

## 2014-05-05 ENCOUNTER — Encounter: Payer: Self-pay | Admitting: Internal Medicine

## 2014-05-05 VITALS — BP 124/80 | Temp 98.3°F

## 2014-05-05 DIAGNOSIS — B373 Candidiasis of vulva and vagina: Secondary | ICD-10-CM

## 2014-05-05 DIAGNOSIS — N183 Chronic kidney disease, stage 3 unspecified: Secondary | ICD-10-CM

## 2014-05-05 DIAGNOSIS — B9620 Unspecified Escherichia coli [E. coli] as the cause of diseases classified elsewhere: Secondary | ICD-10-CM

## 2014-05-05 DIAGNOSIS — I10 Essential (primary) hypertension: Secondary | ICD-10-CM

## 2014-05-05 DIAGNOSIS — J069 Acute upper respiratory infection, unspecified: Secondary | ICD-10-CM

## 2014-05-05 DIAGNOSIS — B3731 Acute candidiasis of vulva and vagina: Secondary | ICD-10-CM

## 2014-05-05 DIAGNOSIS — K5900 Constipation, unspecified: Secondary | ICD-10-CM

## 2014-05-05 DIAGNOSIS — N390 Urinary tract infection, site not specified: Secondary | ICD-10-CM

## 2014-05-05 DIAGNOSIS — E1122 Type 2 diabetes mellitus with diabetic chronic kidney disease: Secondary | ICD-10-CM

## 2014-05-05 MED ORDER — LINACLOTIDE 145 MCG PO CAPS: 145.0000 ug | ORAL_CAPSULE | Freq: Every day | ORAL | Status: DC

## 2014-05-05 MED ORDER — GUAIFENESIN-DM 100-10 MG/5ML PO SYRP: 5.0000 mL | ORAL_SOLUTION | ORAL | Status: DC | PRN

## 2014-05-05 MED ORDER — ALBUTEROL SULFATE HFA 108 (90 BASE) MCG/ACT IN AERS: 2.0000 | INHALATION_SPRAY | Freq: Four times a day (QID) | RESPIRATORY_TRACT | Status: DC | PRN

## 2014-05-05 MED ORDER — FLUCONAZOLE 150 MG PO TABS: ORAL_TABLET | ORAL | Status: DC

## 2014-05-05 NOTE — Progress Notes (Signed)
Patient ID: [name], female   DOB: 1934-09-24, 79 y.o.   MRN: [id number]    Chief Complaint  Patient presents with  . Hospitalization Follow-up    Seen in ER on 04/21/14 for UTI, still with yeast infection (related to antibiotics, tried OTC medication). Patient with vaginal bleeding-? related to scope inserted. Patient with slight bleeding 1st of every month and cramping, previously addressed by GYN.   [name] URI    Dry cough and sneezing since last Thursday    Allergies  Allergen Reactions  . Sulfonamide Derivatives Swelling    To mouth   HPI Pt is here for post hospital follow up. She was in the hospital from 04/21/14-04/24/14 with abdominal pain, fever and was diagnosed with e.coli UTI. She was started on rocephin and iv fluids. Her insulin dose was adjusted. She has itching in her vaginal area and has tried OTC monostat. She is prone to yeast infection post antibiotics.  She has been having cough mostly dry with some clear phlegm and sneezing with runny nose for 6 days. She denies wheezing. Denies any fever or chills.   She also has ocassional vaginal blood spotting, she has been followed by gyn for this.  Review of Systems  Constitutional: Negative for fever, chills,diaphoresis.  HENT: Negative for sore throat.   Eyes: Negative for blurred vision, double vision and discharge.  Respiratory: Negative for shortness of breath.  Cardiovascular: Negative for chest pain, palpitations,leg swelling.  Gastrointestinal: Negative for heartburn, nausea, vomiting, abdominal pain. Positive for constipation. Colace is not helping her.  Genitourinary: Negative for dysuria Musculoskeletal: Negative for falls. Has joint pain Skin: Negative for itching and rash.  Neurological: Negative for dizziness, tingling, focal weakness and headaches.  Psychiatric/Behavioral: Negative for depression  Past Medical History  Diagnosis Date  . COPD (chronic obstructive pulmonary disease)     patient denies  this  . Diabetes mellitus   . Diverticular disease   . Hypertension   . Hyperlipidemia   . Coronary artery disease     a. s/p IMI 2004 tx with BMS to RCA;  b. s/p Promus DES to RCA 2/12 (cath: LM 20-30%, pLAD 20-30%, mLAD 50%, RI 30%, pRCA 60%, mRCA 90% - tx with PCI);  c. myoview 1/07: EF 49%, inf scar, no isch  . Urinary incontinence   . Insomnia   . Osteoarthritis   . Morbid obesity   . Obstructive sleep apnea   . Polymyalgia rheumatica   . Anxiety   . Arthritis   . Depression   . Cholelithiasis   . CHF (congestive heart failure)   . GERD (gastroesophageal reflux disease)   . Pulmonary emboli 9/13    felt to need lifelong anticoagulation  . Debility   . Unspecified constipation   . Allergy   . Gout, unspecified   . Dysuria   . Urinary complications   . Pain, chronic   . Allergic rhinitis due to pollen   . Osteoarthrosis, unspecified whether generalized or localized, lower leg   . Type II or unspecified type diabetes mellitus without mention of complication, uncontrolled   . Anemia, unspecified   . Coronary atherosclerosis of native coronary artery   . Other malaise and fatigue    Medication reviewed. See [facility]  Physical exam BP 124/80 mmHg  Temp(Src) 98.3 F (36.8 C) (Oral)  Constitutional: She is oriented to person, place, and time. Morbidly obese HENT:   Head: Normocephalic.   Eyes: Conjunctivae and EOM are normal. No scleral icterus.  Neck: Neck supple. No thyromegaly present.  Oropharynx: clear, moist, no oropharyngeal erythema, no exudates  Cardiovascular: Normal rate and regular rhythm.  Pulmonary/Chest: CTAB, expiratory wheezes present, no crackles or rhonchi, no use of accessory muscles   Abdominal: She exhibits no distension, no tenderness Musculoskeletal: Normal range of motion. She exhibits no edema.  Lymphadenopathy: She has no cervical adenopathy.  Neurological: She is oriented to person, place, and time.  Skin: No rash noted. No erythema.    Psychiatric: She has a normal mood and affect. Her behavior is normal.  Labs- Lab Results  Component Value Date   WBC 9.4 04/24/2014   HGB 9.5* 04/24/2014   HCT 30.2* 04/24/2014   MCV 87.3 04/24/2014   PLT 221 04/24/2014   CMP     Component Value Date/Time   NA 138 04/23/2014 0300   NA 137 03/05/2014 1141   K 5.2 04/23/2014 0300   CL 100 04/23/2014 0300   CO2 25 04/23/2014 0300   GLUCOSE 225* 04/23/2014 0300   GLUCOSE 188* 03/05/2014 1141   BUN 21 04/23/2014 0300   BUN 29* 03/05/2014 1141   CREATININE 1.42* 04/23/2014 0300   CREATININE 1.4* 05/13/2012   CREATININE 1.33* 10/21/2010 1519   CALCIUM 8.6 04/23/2014 0300   PROT 7.3 04/22/2014 0323   PROT 7.1 03/05/2014 1141   ALBUMIN 2.7* 04/22/2014 0323   AST 18 04/22/2014 0323   ALT 21 04/22/2014 0323   ALKPHOS 105 04/22/2014 0323   BILITOT 0.4 04/22/2014 0323   GFRNONAA 35* 04/23/2014 0300   GFRNONAA 39* 10/21/2010 1519   GFRAA 40* 04/23/2014 0300   GFRAA 47* 10/21/2010 1519   Lab Results  Component Value Date   HGBA1C 11.4* 04/22/2014    Assessment/plan  1. Vaginal candidiasis Post antibiotic treatment for UTI. Start fluconazole 150 mg once a week for 3 weeks and reassess if no improvement  2. Acute upper respiratory infection Likely viral at present. Conservative management with fluids, rest and prn tylenol for fever/ bodyaches. Will provide proventil MDI q6h prn for dyspnea and robitussin prn for cough. Reassess if no improvement  3. Essential hypertension Stable bp reading, continue toprol xl, imdur and lasix for now - CMP; Future  4. DM type 2 causing CKD stage 3 Lab Results  Component Value Date   HGBA1C 11.4* 04/22/2014   With a1c 11.4 from 10.5 s/o poorly controlled dm, advised on dietary changes. Continue current dosing of toujeo and humalog, dosing adjsuted in hospital. Continue pravastatin. Normal foot exam and normal urine microalbumin. Currently on toujeo 120 u daily and humalog 25 u bid.  Monitor cbg - Hemoglobin A1c; Future  5. consitpation Start linzess 145 mcg daily, sample provided, reassess. D/c colace  6. E.coli uti Completed cefitin, continue to monitor clinically

## 2014-05-06 ENCOUNTER — Ambulatory Visit: Payer: Medicare PPO | Admitting: Internal Medicine

## 2014-05-10 DIAGNOSIS — K5903 Drug induced constipation: Secondary | ICD-10-CM | POA: Insufficient documentation

## 2014-05-10 DIAGNOSIS — K5900 Constipation, unspecified: Secondary | ICD-10-CM | POA: Insufficient documentation

## 2014-05-13 ENCOUNTER — Other Ambulatory Visit: Payer: Self-pay | Admitting: *Deleted

## 2014-05-13 ENCOUNTER — Ambulatory Visit (INDEPENDENT_AMBULATORY_CARE_PROVIDER_SITE_OTHER): Payer: Medicare PPO | Admitting: Pharmacist

## 2014-05-13 DIAGNOSIS — G894 Chronic pain syndrome: Secondary | ICD-10-CM

## 2014-05-13 DIAGNOSIS — Z5181 Encounter for therapeutic drug level monitoring: Secondary | ICD-10-CM

## 2014-05-13 DIAGNOSIS — Z7901 Long term (current) use of anticoagulants: Secondary | ICD-10-CM

## 2014-05-13 DIAGNOSIS — I2699 Other pulmonary embolism without acute cor pulmonale: Secondary | ICD-10-CM

## 2014-05-13 LAB — POCT INR: INR: 2.1

## 2014-05-13 MED ORDER — OXYCODONE-ACETAMINOPHEN 10-325 MG PO TABS: ORAL_TABLET | ORAL | Status: DC

## 2014-05-13 NOTE — Telephone Encounter (Signed)
Patient Requested and will pick up 

## 2014-05-18 ENCOUNTER — Telehealth: Payer: Self-pay

## 2014-05-18 NOTE — Telephone Encounter (Signed)
Received order request for diabetic shoes with inserts from [employer]. Printed off last OV and foot exam (performed at [facility] on [address]) and placed in Dr.Pandey's folder for completion

## 2014-05-25 ENCOUNTER — Encounter: Payer: Self-pay | Admitting: Nurse Practitioner

## 2014-05-25 ENCOUNTER — Ambulatory Visit (INDEPENDENT_AMBULATORY_CARE_PROVIDER_SITE_OTHER): Payer: Medicare PPO | Admitting: Nurse Practitioner

## 2014-05-25 VITALS — BP 128/76 | HR 86 | Temp 98.5°F | Resp 12 | Wt 361.0 lb

## 2014-05-25 DIAGNOSIS — E65 Localized adiposity: Secondary | ICD-10-CM

## 2014-05-25 DIAGNOSIS — B3731 Acute candidiasis of vulva and vagina: Secondary | ICD-10-CM

## 2014-05-25 DIAGNOSIS — B373 Candidiasis of vulva and vagina: Secondary | ICD-10-CM

## 2014-05-25 DIAGNOSIS — G4700 Insomnia, unspecified: Secondary | ICD-10-CM

## 2014-05-25 DIAGNOSIS — K5900 Constipation, unspecified: Secondary | ICD-10-CM

## 2014-05-25 NOTE — Progress Notes (Signed)
Patient ID: [name], female   DOB: 1935/04/06, 80 y.o.   MRN: [id number]    PCP: Blanchie Serve, MD  Allergies  Allergen Reactions  . Sulfonamide Derivatives Swelling    To mouth    Chief Complaint  Patient presents with  . Acute Visit    Raised area in stomach x 1 week  . Pelvic Pain    Patient c/o pelvic pain x long time, patient states "whole bottom bothers me"     HPI: Patient is a 80 y.o. female seen in the office today for a knot in her stomach. Started last week, does not know how long it has been there. No nausea or vomiting. No diarrhea or constipation.  Vaginal discharge with odor that smells- was seen 3 weeks ago in office for yeast, reports it is no better. Took fluconazole 150 mg once weekly for 3 weeks. Took last dose last week.  Pt reports she has been having brown discharge and she went to GYN in November. Was told to return if symptoms persisted.  No dysuria. Reports pelvic bone hurts. Cramps in her bottom. Does not sleep well, does not do anything during the day.   Review of Systems:  Review of Systems  Constitutional: Negative for fever, chills, activity change, appetite change, fatigue and unexpected weight change.  HENT: Negative for congestion and sore throat.   Respiratory: Negative for cough and shortness of breath.   Cardiovascular: Negative for chest pain, palpitations and leg swelling.  Gastrointestinal: Positive for constipation (on medication; which helps). Negative for nausea, vomiting, abdominal pain and diarrhea.  Genitourinary: Positive for vaginal discharge. Negative for dysuria, urgency and frequency.  Skin: Negative.   Neurological: Negative for dizziness and headaches.  Psychiatric/Behavioral: Positive for sleep disturbance. The patient is not nervous/anxious.     Past Medical History  Diagnosis Date  . COPD (chronic obstructive pulmonary disease)     patient denies this  . Diabetes mellitus   . Diverticular disease   .  Hypertension   . Hyperlipidemia   . Coronary artery disease     a. s/p IMI 2004 tx with BMS to RCA;  b. s/p Promus DES to RCA 2/12 (cath: LM 20-30%, pLAD 20-30%, mLAD 50%, RI 30%, pRCA 60%, mRCA 90% - tx with PCI);  c. myoview 1/07: EF 49%, inf scar, no isch  . Urinary incontinence   . Insomnia   . Osteoarthritis   . Morbid obesity   . Obstructive sleep apnea   . Polymyalgia rheumatica   . Anxiety   . Arthritis   . Depression   . Cholelithiasis   . CHF (congestive heart failure)   . GERD (gastroesophageal reflux disease)   . Pulmonary emboli 9/13    felt to need lifelong anticoagulation  . Debility   . Unspecified constipation   . Allergy   . Gout, unspecified   . Dysuria   . Urinary complications   . Pain, chronic   . Allergic rhinitis due to pollen   . Osteoarthrosis, unspecified whether generalized or localized, lower leg   . Type II or unspecified type diabetes mellitus without mention of complication, uncontrolled   . Anemia, unspecified   . Coronary atherosclerosis of native coronary artery   . Other malaise and fatigue    Past Surgical History  Procedure Laterality Date  . Coronary artery bypass graft    . Appendectomy    . Cardiac catheterization    . Gastric bypass  1977  reversed in 1979, [facility]  . Blood clots/legs and lungs  2013  . Mi with stent placement  2004   Social History:   reports that she has never smoked. She does not have any smokeless tobacco history on file. She reports that she does not drink alcohol or use illicit drugs.  Family History  Problem Relation Age of Onset  . Breast cancer Mother   . Heart disease Mother   . Throat cancer Father   . Hypertension Father   . Arthritis Father   . Diabetes Father   . Arthritis Sister   . Obesity Sister   . Diabetes Sister     Medications: Patient's Medications  New Prescriptions   No medications on file  Previous Medications   ACETAMINOPHEN (TYLENOL) 325 MG TABLET    Take 2  tablets (650 mg total) by mouth every 6 (six) hours as needed for mild pain (or Fever >/= 101).   ALBUTEROL (PROVENTIL HFA;VENTOLIN HFA) 108 (90 BASE) MCG/ACT INHALER    Inhale 2 puffs into the lungs every 6 (six) hours as needed for wheezing or shortness of breath.   CINNAMON PO    Take by mouth. 1 by mouth twice daily   CLONAZEPAM (KLONOPIN) 1 MG TABLET    Take one tablet in the morning and two tablets at bedtime for sleep   COLCHICINE (COLCRYS) 0.6 MG TABLET    Take 1 tablet by mouth every day for gout   DIPHENHYDRAMINE (BENADRYL) 25 MG TABLET    Take 50 mg by mouth at bedtime as needed for itching.   DULOXETINE (CYMBALTA) 60 MG CAPSULE    Take 1 capsule by mouth every day for anxiety   FUROSEMIDE (LASIX) 20 MG TABLET    Take one tablet by mouth twice daily for edema   GUAIFENESIN-DEXTROMETHORPHAN (ROBITUSSIN DM) 100-10 MG/5ML SYRUP    Take 5 mLs by mouth every 4 (four) hours as needed for cough.   HYDROCORTISONE CREAM 1 %    Apply 1 application topically daily as needed for itching.   INSULIN GLARGINE (TOUJEO SOLOSTAR) 300 UNIT/ML SOPN    Inject 120 Units into the skin daily.   INSULIN LISPRO, HUMAN, (HUMALOG KWIKPEN) 200 UNIT/ML SOPN    Inject 25 Units into the skin 2 (two) times daily with a meal.   ISOSORBIDE MONONITRATE (IMDUR) 60 MG 24 HR TABLET    Take 1 tablet (60 mg total) by mouth daily.   LINACLOTIDE (LINZESS) 145 MCG CAPS CAPSULE    Take 1 capsule (145 mcg total) by mouth daily. Sample provided   METOPROLOL SUCCINATE (TOPROL-XL) 25 MG 24 HR TABLET    Take 1 tablet by mouth once every day for blood pressure   NITROGLYCERIN (NITROSTAT) 0.4 MG SL TABLET    Take one tablet under the tongue every 5 minutes as needed for chest pain   OXYCODONE-ACETAMINOPHEN (PERCOCET) 10-325 MG PER TABLET    Take one tablet every 6 hours as needed for pain   PANTOPRAZOLE (PROTONIX) 40 MG TABLET    Take 1 tablet by mouth every day   PHENYLEPHRINE-APAP-GUAIFENESIN (MUCINEX SINUS-MAX CONGESTION) 5-325-200 MG  TABS    Take 1 tablet by mouth daily as needed (congestion).    PRAVASTATIN (PRAVACHOL) 20 MG TABLET    Take 1 tablet by mouth once every day for cholesterol   WARFARIN (COUMADIN) 5 MG TABLET    Take 0.5-1 tablets (2.5-5 mg total) by mouth daily. Takes 7.5mg  on Tuesday, Thursday, and Saturday.  All other  days take 5mg   Modified Medications   No medications on file  Discontinued Medications   FIBER FORMULA PO    Take 3 tablets by mouth daily. gummy   FLUCONAZOLE (DIFLUCAN) 150 MG TABLET    Take one tablet by mouth once a week x 3 weeks     Physical Exam:  Filed Vitals:   05/25/14 1449  BP: 128/76  Pulse: 86  Temp: 98.5 F (36.9 C)  TempSrc: Oral  Resp: 12  Weight: 361 lb (163.749 kg)  SpO2: 99%    Physical Exam  Constitutional: She is oriented to person, place, and time. She appears well-developed and well-nourished. No distress.  Obese female  Cardiovascular: Normal rate, regular rhythm and normal heart sounds.   Pulmonary/Chest: Effort normal and breath sounds normal. No respiratory distress.  Abdominal: Soft. Bowel sounds are normal. She exhibits no distension. There is no tenderness.  Small marble sized dense area in LLQ   Genitourinary: Vagina normal. No erythema in the vagina.  Leaks urine  Musculoskeletal: She exhibits no edema or tenderness.  Neurological: She is alert and oriented to person, place, and time.  Skin: Skin is warm and dry. She is not diaphoretic.    Labs reviewed: Basic Metabolic Panel:  Recent Labs  04/21/14 1251 04/21/14 1321 04/22/14 0323 04/23/14 0300  NA 133* 133* 130* 138  K 4.9 4.7 5.3 5.2  CL 92* 97 93* 100  CO2 24  --  25 25  GLUCOSE 252* 259* 299* 225*  BUN 19 20 22 21   CREATININE 1.32* 1.50* 1.54* 1.42*  CALCIUM 9.3  --  8.4 8.6   Liver Function Tests:  Recent Labs  03/05/14 1141 04/21/14 1251 04/22/14 0323  AST 17 23 18   ALT 18 29 21   ALKPHOS 85 96 105  BILITOT 0.3 0.7 0.4  PROT 7.1 8.4* 7.3  ALBUMIN  --  3.4* 2.7*    No results for input(s): LIPASE, AMYLASE in the last 8760 hours. No results for input(s): AMMONIA in the last 8760 hours. CBC:  Recent Labs  04/21/14 1251  04/22/14 0323 04/23/14 0300 04/24/14 0510  WBC 11.9*  --  31.9* 17.1* 9.4  NEUTROABS 10.5*  --   --  13.6*  --   HGB 11.4*  < > 10.1* 9.7* 9.5*  HCT 36.3  < > 31.5* 30.8* 30.2*  MCV 86.2  --  87.7 86.3 87.3  PLT 265  --  241 225 221  < > = values in this interval not displayed. Lipid Panel: No results for input(s): CHOL, HDL, LDLCALC, TRIG, CHOLHDL, LDLDIRECT in the last 8760 hours. TSH: No results for input(s): TSH in the last 8760 hours. A1C: Lab Results  Component Value Date   HGBA1C 11.4* 04/22/2014     Assessment/Plan 1. Lipohypertrophy -to left lower quadrant, using site freq for insulin injection -educated on rotation of sites for insulin (reports she is staying on the left lower quadrant) -to monitor and notify if area becomes larger   2. Insomnia -to become more active during the day -discussed sleep habits -may try melatonin 3 mg q hs  3. Vaginal candidiasis -resolved, to follow up with GYN regarding brown discharge   4. Constipation, unspecified constipation type -improved to cont linzess   Keep follow up appt

## 2014-05-25 NOTE — Patient Instructions (Signed)
Make sure you are doing something during the day to make you tired at night May try melatonin 3 mg 30 mins before bedtime  Follow up with GYN  Rotate your insulin to the other side of your abdomen May also use back of arms and fatty area of the legs   Insomnia Insomnia is frequent trouble falling and/or staying asleep. Insomnia can be a long term problem or a short term problem. Both are common. Insomnia can be a short term problem when the wakefulness is related to a certain stress or worry. Long term insomnia is often related to ongoing stress during waking hours and/or poor sleeping habits. Overtime, sleep deprivation itself can make the problem worse. Every little thing feels more severe because you are overtired and your ability to cope is decreased. CAUSES   Stress, anxiety, and depression.  Poor sleeping habits.  Distractions such as TV in the bedroom.  Naps close to bedtime.  Engaging in emotionally charged conversations before bed.  Technical reading before sleep.  Alcohol and other sedatives. They may make the problem worse. They can hurt normal sleep patterns and normal dream activity.  Stimulants such as caffeine for several hours prior to bedtime.  Pain syndromes and shortness of breath can cause insomnia.  Exercise late at night.  Changing time zones may cause sleeping problems (jet lag). It is sometimes helpful to have someone observe your sleeping patterns. They should look for periods of not breathing during the night (sleep apnea). They should also look to see how long those periods last. If you live alone or observers are uncertain, you can also be observed at a sleep clinic where your sleep patterns will be professionally monitored. Sleep apnea requires a checkup and treatment. Give your caregivers your medical history. Give your caregivers observations your family has made about your sleep.  SYMPTOMS   Not feeling rested in the morning.  Anxiety and  restlessness at bedtime.  Difficulty falling and staying asleep. TREATMENT   Your caregiver may prescribe treatment for an underlying medical disorders. Your caregiver can give advice or help if you are using alcohol or other drugs for self-medication. Treatment of underlying problems will usually eliminate insomnia problems.  Medications can be prescribed for short time use. They are generally not recommended for lengthy use.  Over-the-counter sleep medicines are not recommended for lengthy use. They can be habit forming.  You can promote easier sleeping by making lifestyle changes such as:  Using relaxation techniques that help with breathing and reduce muscle tension.  Exercising earlier in the day.  Changing your diet and the time of your last meal. No night time snacks.  Establish a regular time to go to bed.  Counseling can help with stressful problems and worry.  Soothing music and white noise may be helpful if there are background noises you cannot remove.  Stop tedious detailed work at least one hour before bedtime. HOME CARE INSTRUCTIONS   Keep a diary. Inform your caregiver about your progress. This includes any medication side effects. See your caregiver regularly. Take note of:  Times when you are asleep.  Times when you are awake during the night.  The quality of your sleep.  How you feel the next day. This information will help your caregiver care for you.  Get out of bed if you are still awake after 15 minutes. Read or do some quiet activity. Keep the lights down. Wait until you feel sleepy and go back to bed.  Keep  regular sleeping and waking hours. Avoid naps.  Exercise regularly.  Avoid distractions at bedtime. Distractions include watching television or engaging in any intense or detailed activity like attempting to balance the household checkbook.  Develop a bedtime ritual. Keep a familiar routine of bathing, brushing your teeth, climbing into bed  at the same time each night, listening to soothing music. Routines increase the success of falling to sleep faster.  Use relaxation techniques. This can be using breathing and muscle tension release routines. It can also include visualizing peaceful scenes. You can also help control troubling or intruding thoughts by keeping your mind occupied with boring or repetitive thoughts like the old concept of counting sheep. You can make it more creative like imagining planting one beautiful flower after another in your backyard garden.  During your day, work to eliminate stress. When this is not possible use some of the previous suggestions to help reduce the anxiety that accompanies stressful situations. MAKE SURE YOU:   Understand these instructions.  Will watch your condition.  Will get help right away if you are not doing well or get worse. Document Released: 04/21/2000 Document Revised: 07/17/2011 Document Reviewed: 05/22/2007 [facility] Patient Information 2015 [place], [place]. This information is not intended to replace advice given to you by your health care provider. Make sure you discuss any questions you have with your health care provider.

## 2014-05-26 LAB — HM DIABETES EYE EXAM

## 2014-05-27 ENCOUNTER — Encounter: Payer: Self-pay | Admitting: *Deleted

## 2014-06-04 ENCOUNTER — Other Ambulatory Visit: Payer: Medicare PPO

## 2014-06-04 ENCOUNTER — Other Ambulatory Visit: Payer: Self-pay | Admitting: Obstetrics and Gynecology

## 2014-06-04 ENCOUNTER — Ambulatory Visit (INDEPENDENT_AMBULATORY_CARE_PROVIDER_SITE_OTHER): Payer: Medicare PPO | Admitting: Nurse Practitioner

## 2014-06-04 ENCOUNTER — Encounter: Payer: Self-pay | Admitting: Nurse Practitioner

## 2014-06-04 VITALS — BP 138/80 | HR 77 | Temp 98.1°F | Resp 20 | Ht 67.0 in | Wt 361.0 lb

## 2014-06-04 DIAGNOSIS — D229 Melanocytic nevi, unspecified: Secondary | ICD-10-CM

## 2014-06-04 DIAGNOSIS — N939 Abnormal uterine and vaginal bleeding, unspecified: Secondary | ICD-10-CM

## 2014-06-04 DIAGNOSIS — E1169 Type 2 diabetes mellitus with other specified complication: Secondary | ICD-10-CM

## 2014-06-04 DIAGNOSIS — M21969 Unspecified acquired deformity of unspecified lower leg: Secondary | ICD-10-CM

## 2014-06-04 DIAGNOSIS — E1122 Type 2 diabetes mellitus with diabetic chronic kidney disease: Secondary | ICD-10-CM

## 2014-06-04 DIAGNOSIS — N950 Postmenopausal bleeding: Secondary | ICD-10-CM | POA: Insufficient documentation

## 2014-06-04 DIAGNOSIS — I2699 Other pulmonary embolism without acute cor pulmonale: Secondary | ICD-10-CM

## 2014-06-04 DIAGNOSIS — N183 Chronic kidney disease, stage 3 unspecified: Secondary | ICD-10-CM

## 2014-06-04 DIAGNOSIS — E1165 Type 2 diabetes mellitus with hyperglycemia: Secondary | ICD-10-CM

## 2014-06-04 DIAGNOSIS — I10 Essential (primary) hypertension: Secondary | ICD-10-CM

## 2014-06-04 DIAGNOSIS — IMO0002 Reserved for concepts with insufficient information to code with codable children: Secondary | ICD-10-CM

## 2014-06-04 DIAGNOSIS — G4733 Obstructive sleep apnea (adult) (pediatric): Secondary | ICD-10-CM

## 2014-06-04 NOTE — Progress Notes (Signed)
Patient ID: [name], female   DOB: 1934-10-21, 80 y.o.   MRN: [id number]    PCP: Nichole Serve, MD  Allergies  Allergen Reactions  . Sulfonamide Derivatives Swelling    To mouth     Chief Complaint  Patient presents with  . needs referral due to insurance    referral x2 (podiatry/dermatology)     HPI: Patient is a 80 y.o. female seen in the office today for referrals has [employer] and needs new referrals every year.  Going to [name] today for GYN due to abnormal bleeding Going to [facility] for INR management and Cardiology (Dr Acie Fredrickson)  Seeing Dr Gwenette Greet due to OSA, using CPAP at night  Needs new referral for dermatology due to moles throughout her body and one that is bothersome on her eyelid that she would like removed.  Also would like toenails cut by a podiatrist due to being thick and diabetic  Review of Systems:  Review of Systems  Constitutional: Negative for fever, chills, activity change, appetite change, fatigue and unexpected weight change.  HENT: Negative for congestion and sore throat.   Respiratory: Negative for cough and shortness of breath.   Cardiovascular: Negative for chest pain, palpitations and leg swelling.  Gastrointestinal: Positive for constipation (on medication; which helps). Negative for nausea, vomiting, abdominal pain and diarrhea.  Genitourinary: Positive for vaginal bleeding (hx of, following with GYN). Negative for dysuria, urgency and frequency.  Skin:       Multiple moles, itch   Neurological: Negative for dizziness and headaches.    Past Medical History  Diagnosis Date  . COPD (chronic obstructive pulmonary disease)     patient denies this  . Diabetes mellitus   . Diverticular disease   . Hypertension   . Hyperlipidemia   . Coronary artery disease     a. s/p IMI 2004 tx with BMS to RCA;  b. s/p Promus DES to RCA 2/12 (cath: LM 20-30%, pLAD 20-30%, mLAD 50%, RI 30%, pRCA 60%, mRCA 90% - tx with PCI);  c. myoview 1/07: EF  49%, inf scar, no isch  . Urinary incontinence   . Insomnia   . Osteoarthritis   . Morbid obesity   . Obstructive sleep apnea   . Polymyalgia rheumatica   . Anxiety   . Arthritis   . Depression   . Cholelithiasis   . CHF (congestive heart failure)   . GERD (gastroesophageal reflux disease)   . Pulmonary emboli 9/13    felt to need lifelong anticoagulation  . Debility   . Unspecified constipation   . Allergy   . Gout, unspecified   . Dysuria   . Urinary complications   . Pain, chronic   . Allergic rhinitis due to pollen   . Osteoarthrosis, unspecified whether generalized or localized, lower leg   . Type II or unspecified type diabetes mellitus without mention of complication, uncontrolled   . Anemia, unspecified   . Coronary atherosclerosis of native coronary artery   . Other malaise and fatigue    Past Surgical History  Procedure Laterality Date  . Coronary artery bypass graft    . Appendectomy    . Cardiac catheterization    . Gastric bypass  1977     reversed in 1979, [facility]  . Blood clots/legs and lungs  2013  . Mi with stent placement  2004   Social History:   reports that she has never smoked. She does not have any smokeless tobacco history on  file. She reports that she does not drink alcohol or use illicit drugs.  Family History  Problem Relation Age of Onset  . Breast cancer Mother   . Heart disease Mother   . Throat cancer Father   . Hypertension Father   . Arthritis Father   . Diabetes Father   . Arthritis Sister   . Obesity Sister   . Diabetes Sister     Medications: Patient's Medications  New Prescriptions   No medications on file  Previous Medications   ACETAMINOPHEN (TYLENOL) 325 MG TABLET    Take 2 tablets (650 mg total) by mouth every 6 (six) hours as needed for mild pain (or Fever >/= 101).   ALBUTEROL (PROVENTIL HFA;VENTOLIN HFA) 108 (90 BASE) MCG/ACT INHALER    Inhale 2 puffs into the lungs every 6 (six) hours as needed for  wheezing or shortness of breath.   CINNAMON PO    Take by mouth. 1 by mouth twice daily   CLONAZEPAM (KLONOPIN) 1 MG TABLET    Take one tablet in the morning and two tablets at bedtime for sleep   COLCHICINE (COLCRYS) 0.6 MG TABLET    Take 1 tablet by mouth every day for gout   DIPHENHYDRAMINE (BENADRYL) 25 MG TABLET    Take 50 mg by mouth at bedtime as needed for itching.   DULOXETINE (CYMBALTA) 60 MG CAPSULE    Take 1 capsule by mouth every day for anxiety   FUROSEMIDE (LASIX) 20 MG TABLET    Take one tablet by mouth twice daily for edema   GUAIFENESIN-DEXTROMETHORPHAN (ROBITUSSIN DM) 100-10 MG/5ML SYRUP    Take 5 mLs by mouth every 4 (four) hours as needed for cough.   HYDROCORTISONE CREAM 1 %    Apply 1 application topically daily as needed for itching.   INSULIN GLARGINE (TOUJEO SOLOSTAR) 300 UNIT/ML SOPN    Inject 120 Units into the skin daily.   INSULIN LISPRO, HUMAN, (HUMALOG KWIKPEN) 200 UNIT/ML SOPN    Inject 25 Units into the skin 2 (two) times daily with a meal.   ISOSORBIDE MONONITRATE (IMDUR) 60 MG 24 HR TABLET    Take 1 tablet (60 mg total) by mouth daily.   LINACLOTIDE (LINZESS) 145 MCG CAPS CAPSULE    Take 1 capsule (145 mcg total) by mouth daily. Sample provided   METOPROLOL SUCCINATE (TOPROL-XL) 25 MG 24 HR TABLET    Take 1 tablet by mouth once every day for blood pressure   NITROGLYCERIN (NITROSTAT) 0.4 MG SL TABLET    Take one tablet under the tongue every 5 minutes as needed for chest pain   OXYCODONE-ACETAMINOPHEN (PERCOCET) 10-325 MG PER TABLET    Take one tablet every 6 hours as needed for pain   PANTOPRAZOLE (PROTONIX) 40 MG TABLET    Take 1 tablet by mouth every day   PHENYLEPHRINE-APAP-GUAIFENESIN (MUCINEX SINUS-MAX CONGESTION) 5-325-200 MG TABS    Take 1 tablet by mouth daily as needed (congestion).    PRAVASTATIN (PRAVACHOL) 20 MG TABLET    Take 1 tablet by mouth once every day for cholesterol   WARFARIN (COUMADIN) 5 MG TABLET    Take 0.5-1 tablets (2.5-5 mg total) by  mouth daily. Takes 7.5mg  on Tuesday, Thursday, and Saturday.  All other days take 5mg   Modified Medications   No medications on file  Discontinued Medications   No medications on file     Physical Exam:  Filed Vitals:   06/04/14 1017  BP: 138/80  Pulse: 77  Temp:  98.1 F (36.7 C)  TempSrc: Oral  Resp: 20  Height: 5\' 7"  (1.702 m)  Weight: 361 lb (163.749 kg)  SpO2: 97%    Physical Exam  Constitutional: She is oriented to person, place, and time. She appears well-developed and well-nourished. No distress.  Obese female  Cardiovascular: Normal rate, regular rhythm and normal heart sounds.   Pulmonary/Chest: Effort normal and breath sounds normal. No respiratory distress.  Abdominal: Soft. Bowel sounds are normal. She exhibits no distension. There is no tenderness.  Genitourinary: Vagina normal. No erythema in the vagina.  Musculoskeletal: She exhibits no edema or tenderness.  Neurological: She is alert and oriented to person, place, and time.  Skin: Skin is warm and dry. She is not diaphoretic.  Multiple moles on face and raised mole on right eyelid    Labs reviewed: Basic Metabolic Panel:  Recent Labs  04/21/14 1251 04/21/14 1321 04/22/14 0323 04/23/14 0300  NA 133* 133* 130* 138  K 4.9 4.7 5.3 5.2  CL 92* 97 93* 100  CO2 24  --  25 25  GLUCOSE 252* 259* 299* 225*  BUN 19 20 22 21   CREATININE 1.32* 1.50* 1.54* 1.42*  CALCIUM 9.3  --  8.4 8.6   Liver Function Tests:  Recent Labs  03/05/14 1141 04/21/14 1251 04/22/14 0323  AST 17 23 18   ALT 18 29 21   ALKPHOS 85 96 105  BILITOT 0.3 0.7 0.4  PROT 7.1 8.4* 7.3  ALBUMIN  --  3.4* 2.7*   No results for input(s): LIPASE, AMYLASE in the last 8760 hours. No results for input(s): AMMONIA in the last 8760 hours. CBC:  Recent Labs  04/21/14 1251  04/22/14 0323 04/23/14 0300 04/24/14 0510  WBC 11.9*  --  31.9* 17.1* 9.4  NEUTROABS 10.5*  --   --  13.6*  --   HGB 11.4*  < > 10.1* 9.7* 9.5*  HCT 36.3  <  > 31.5* 30.8* 30.2*  MCV 86.2  --  87.7 86.3 87.3  PLT 265  --  241 225 221  < > = values in this interval not displayed. Lipid Panel: No results for input(s): CHOL, HDL, LDLCALC, TRIG, CHOLHDL, LDLDIRECT in the last 8760 hours. TSH: No results for input(s): TSH in the last 8760 hours. A1C: Lab Results  Component Value Date   HGBA1C 11.4* 04/22/2014     Assessment/Plan 1. OSA (obstructive sleep apnea) -uses CPAP per pulmonary  - Ambulatory referral to Pulmonology  2. Pulmonary embolus -with DVT on chronic coumadin being managed by cardiology - Ambulatory referral to Cardiology  3. Vaginal bleeding -has been evaluated by GYN, has follow up tomorrow  - Ambulatory referral to Gynecology  4. Nevus -multiple areas she would like to have removed including one on eyelid - Ambulatory referral to Dermatology  5. Type 2 diabetes mellitus with diabetic foot deformity -has appt scheduled with [facility] for diabetic management, - Ambulatory referral to Podiatry

## 2014-06-05 LAB — MICROALBUMIN / CREATININE URINE RATIO
Creatinine, Ur: 55.2 mg/dL (ref 15.0–278.0)
MICROALB/CREAT RATIO: 19.4 mg/g creat (ref 0.0–30.0)
Microalbumin, Urine: 10.7 ug/mL (ref 0.0–17.0)

## 2014-06-05 LAB — COMPREHENSIVE METABOLIC PANEL
ALT: 17 IU/L (ref 0–32)
AST: 20 IU/L (ref 0–40)
Albumin/Globulin Ratio: 0.9 — ABNORMAL LOW (ref 1.1–2.5)
Albumin: 3.5 g/dL (ref 3.5–4.8)
Alkaline Phosphatase: 92 IU/L (ref 39–117)
BUN/Creatinine Ratio: 18 (ref 11–26)
BUN: 24 mg/dL (ref 8–27)
CO2: 22 mmol/L (ref 18–29)
Calcium: 8.6 mg/dL — ABNORMAL LOW (ref 8.7–10.3)
Chloride: 102 mmol/L (ref 97–108)
Creatinine, Ser: 1.3 mg/dL — ABNORMAL HIGH (ref 0.57–1.00)
GFR calc Af Amer: 46 mL/min/{1.73_m2} — ABNORMAL LOW (ref >59)
GFR calc non Af Amer: 40 mL/min/{1.73_m2} — ABNORMAL LOW (ref >59)
Globulin, Total: 3.9 g/dL (ref 1.5–4.5)
Glucose: 158 mg/dL — ABNORMAL HIGH (ref 65–99)
Potassium: 4.8 mmol/L (ref 3.5–5.2)
Sodium: 140 mmol/L (ref 134–144)
Total Bilirubin: 0.3 mg/dL (ref 0.0–1.2)
Total Protein: 7.4 g/dL (ref 6.0–8.5)

## 2014-06-05 LAB — LIPID PANEL
Chol/HDL Ratio: 4.2 ratio units (ref 0.0–4.4)
Cholesterol, Total: 171 mg/dL (ref 100–199)
HDL: 41 mg/dL (ref >39)
LDL Calculated: 85 mg/dL (ref 0–99)
Triglycerides: 225 mg/dL — ABNORMAL HIGH (ref 0–149)
VLDL Cholesterol Cal: 45 mg/dL — ABNORMAL HIGH (ref 5–40)

## 2014-06-05 LAB — HEMOGLOBIN A1C
Est. average glucose Bld gHb Est-mCnc: 246 mg/dL
Hgb A1c MFr Bld: 10.2 % — ABNORMAL HIGH (ref 4.8–5.6)

## 2014-06-08 ENCOUNTER — Encounter: Payer: Self-pay | Admitting: Pharmacotherapy

## 2014-06-08 ENCOUNTER — Ambulatory Visit (INDEPENDENT_AMBULATORY_CARE_PROVIDER_SITE_OTHER): Payer: Medicare PPO | Admitting: Pharmacotherapy

## 2014-06-08 VITALS — BP 138/78 | HR 77 | Temp 97.6°F | Resp 18 | Ht 67.0 in | Wt 362.0 lb

## 2014-06-08 DIAGNOSIS — N183 Chronic kidney disease, stage 3 unspecified: Secondary | ICD-10-CM

## 2014-06-08 DIAGNOSIS — IMO0002 Reserved for concepts with insufficient information to code with codable children: Secondary | ICD-10-CM

## 2014-06-08 DIAGNOSIS — N189 Chronic kidney disease, unspecified: Secondary | ICD-10-CM

## 2014-06-08 DIAGNOSIS — E1122 Type 2 diabetes mellitus with diabetic chronic kidney disease: Secondary | ICD-10-CM

## 2014-06-08 DIAGNOSIS — I10 Essential (primary) hypertension: Secondary | ICD-10-CM

## 2014-06-08 DIAGNOSIS — E1165 Type 2 diabetes mellitus with hyperglycemia: Secondary | ICD-10-CM

## 2014-06-08 MED ORDER — INSULIN LISPRO 200 UNIT/ML ~~LOC~~ SOPN: 35.0000 [IU] | PEN_INJECTOR | Freq: Two times a day (BID) | SUBCUTANEOUS | Status: DC

## 2014-06-08 NOTE — Progress Notes (Signed)
  Subjective:    [name] is a 80 y.o.African American female who presents for follow-up of Type 2 diabetes mellitus.   A1C was 10.5% - now 10.2% Forgot to bring blood glucose meter.   She says fasting BG 98-150.  Not checking any other time during the day. No hypoglycemia.  Admits to poor eating habits. Doing some chair exercises.  Had a physical therapist come to the house for a few visits. Has pain in feet - no wounds or sores. Some peripheral edema. Wears glasses.  Had eye exam last week.  Has "spots" in right eye from her diabetes.  No need for intervention at this time. Nocturia 4 x per night. Takes her diuretic at night.  She is taking Lantus 110 units at night  She is taking Humalog 25 units twice daily.  Review of Systems A comprehensive review of systems was negative except for: Ears, nose, mouth, throat, and face: positive for nothing Cardiovascular: positive for lower extremity edema Genitourinary: positive for nocturia Endocrine: positive for diabetic symptoms including blurry vision, polyphagia, polyuria and skin dryness    Objective:    BP 138/78 mmHg  Pulse 77  Temp(Src) 97.6 F (36.4 C) (Oral)  Resp 18  Ht 5\' 7"  (1.702 m)  Wt 362 lb (164.202 kg)  BMI 56.68 kg/m2  SpO2 97%  General:  alert, cooperative and no distress  Oropharynx: normal findings: lips normal without lesions and gums healthy   Eyes:  negative findings: lids and lashes normal and conjunctivae and sclerae normal   Ears:  external ears normal        Lung: clear to auscultation bilaterally  Heart:  regular rate and rhythm     Extremities: edema bilateral lower extremities  Skin: dry     Neuro: mental status, speech normal, alert and oriented x3   Lab Review GLUCOSE (mg/dL)  Date Value  06/04/2014 158*  03/05/2014 188*  12/11/2013 121*   GLUCOSE, BLD (mg/dL)  Date Value  04/23/2014 225*  04/22/2014 299*  04/21/2014 259*   CO2  Date Value  06/04/2014 22 mmol/L   04/23/2014 25 mEq/L  04/22/2014 25 mEq/L   BUN (mg/dL)  Date Value  06/04/2014 24  04/23/2014 21  04/22/2014 22  04/21/2014 20  03/05/2014 29*  12/11/2013 24   CREATININE (mg/dL)  Date Value  05/13/2012 1.4*   CREAT (mg/dL)  Date Value  10/21/2010 1.33*   CREATININE, SER (mg/dL)  Date Value  06/04/2014 1.30*  04/23/2014 1.42*  04/22/2014 1.54*    Microalbumin:  10.7 Total cholesterol:  171 Triglycerides:  225 HDL:  41 LDL:  85   Assessment:    Diabetes Mellitus type II, under poor control. A1C better, but above goal <7% BP at goal <140/90   Plan:    1.  Rx changes: increase Humalog 35 units twice daily 2.  Continue Lantus 110 units daily. 3.  Needs to SMBG at least twice daily. 4.  Counseled on need for routine exercise.  Goal is 30-45 minutes 5 x week. Needs to continue chair exercise taught by physical therapy. 5.  Counseled on nutrition goals. 6.  LDL at goal <100

## 2014-06-08 NOTE — Patient Instructions (Signed)
Increase Humalog to 35 units twice daily

## 2014-06-11 ENCOUNTER — Other Ambulatory Visit: Payer: Self-pay | Admitting: *Deleted

## 2014-06-11 DIAGNOSIS — G894 Chronic pain syndrome: Secondary | ICD-10-CM

## 2014-06-11 MED ORDER — OXYCODONE-ACETAMINOPHEN 10-325 MG PO TABS: ORAL_TABLET | ORAL | Status: DC

## 2014-06-11 NOTE — Telephone Encounter (Signed)
Patient requested and will pick up 

## 2014-06-15 ENCOUNTER — Other Ambulatory Visit: Payer: Self-pay | Admitting: *Deleted

## 2014-06-15 NOTE — Telephone Encounter (Signed)
Patient just called with an FYI of pharmacy change to [facility]

## 2014-06-23 ENCOUNTER — Ambulatory Visit: Payer: Medicare PPO | Admitting: Internal Medicine

## 2014-06-24 ENCOUNTER — Telehealth: Payer: Self-pay | Admitting: Cardiology

## 2014-06-24 ENCOUNTER — Encounter (HOSPITAL_COMMUNITY): Payer: Self-pay | Admitting: *Deleted

## 2014-06-24 ENCOUNTER — Inpatient Hospital Stay (HOSPITAL_COMMUNITY)
Admission: EM | Admit: 2014-06-24 | Discharge: 2014-06-30 | DRG: 287 | Disposition: A | Discharge status: Home / Self Care | Payer: Medicare PPO | Attending: Internal Medicine | Admitting: Internal Medicine

## 2014-06-24 ENCOUNTER — Emergency Department (HOSPITAL_COMMUNITY): Payer: Medicare PPO

## 2014-06-24 DIAGNOSIS — Z794 Long term (current) use of insulin: Secondary | ICD-10-CM

## 2014-06-24 DIAGNOSIS — R791 Abnormal coagulation profile: Secondary | ICD-10-CM | POA: Diagnosis present

## 2014-06-24 DIAGNOSIS — J449 Chronic obstructive pulmonary disease, unspecified: Secondary | ICD-10-CM | POA: Diagnosis present

## 2014-06-24 DIAGNOSIS — Z79899 Other long term (current) drug therapy: Secondary | ICD-10-CM

## 2014-06-24 DIAGNOSIS — R531 Weakness: Secondary | ICD-10-CM | POA: Diagnosis present

## 2014-06-24 DIAGNOSIS — R0600 Dyspnea, unspecified: Secondary | ICD-10-CM | POA: Diagnosis present

## 2014-06-24 DIAGNOSIS — E1122 Type 2 diabetes mellitus with diabetic chronic kidney disease: Secondary | ICD-10-CM | POA: Diagnosis present

## 2014-06-24 DIAGNOSIS — R5381 Other malaise: Secondary | ICD-10-CM | POA: Diagnosis present

## 2014-06-24 DIAGNOSIS — Z86711 Personal history of pulmonary embolism: Secondary | ICD-10-CM

## 2014-06-24 DIAGNOSIS — I5033 Acute on chronic diastolic (congestive) heart failure: Principal | ICD-10-CM | POA: Diagnosis present

## 2014-06-24 DIAGNOSIS — I5032 Chronic diastolic (congestive) heart failure: Secondary | ICD-10-CM | POA: Diagnosis present

## 2014-06-24 DIAGNOSIS — F329 Major depressive disorder, single episode, unspecified: Secondary | ICD-10-CM | POA: Diagnosis present

## 2014-06-24 DIAGNOSIS — M353 Polymyalgia rheumatica: Secondary | ICD-10-CM | POA: Diagnosis present

## 2014-06-24 DIAGNOSIS — R9439 Abnormal result of other cardiovascular function study: Secondary | ICD-10-CM | POA: Insufficient documentation

## 2014-06-24 DIAGNOSIS — IMO0002 Reserved for concepts with insufficient information to code with codable children: Secondary | ICD-10-CM

## 2014-06-24 DIAGNOSIS — E11649 Type 2 diabetes mellitus with hypoglycemia without coma: Secondary | ICD-10-CM | POA: Diagnosis not present

## 2014-06-24 DIAGNOSIS — E1165 Type 2 diabetes mellitus with hyperglycemia: Secondary | ICD-10-CM | POA: Diagnosis present

## 2014-06-24 DIAGNOSIS — J4489 Other specified chronic obstructive pulmonary disease: Secondary | ICD-10-CM | POA: Diagnosis present

## 2014-06-24 DIAGNOSIS — Z7951 Long term (current) use of inhaled steroids: Secondary | ICD-10-CM

## 2014-06-24 DIAGNOSIS — F419 Anxiety disorder, unspecified: Secondary | ICD-10-CM | POA: Diagnosis present

## 2014-06-24 DIAGNOSIS — E785 Hyperlipidemia, unspecified: Secondary | ICD-10-CM | POA: Diagnosis present

## 2014-06-24 DIAGNOSIS — Z9119 Patient's noncompliance with other medical treatment and regimen: Secondary | ICD-10-CM | POA: Diagnosis present

## 2014-06-24 DIAGNOSIS — I2510 Atherosclerotic heart disease of native coronary artery without angina pectoris: Secondary | ICD-10-CM | POA: Diagnosis not present

## 2014-06-24 DIAGNOSIS — R0609 Other forms of dyspnea: Secondary | ICD-10-CM | POA: Diagnosis present

## 2014-06-24 DIAGNOSIS — I252 Old myocardial infarction: Secondary | ICD-10-CM

## 2014-06-24 DIAGNOSIS — J209 Acute bronchitis, unspecified: Secondary | ICD-10-CM | POA: Diagnosis present

## 2014-06-24 DIAGNOSIS — Z9884 Bariatric surgery status: Secondary | ICD-10-CM

## 2014-06-24 DIAGNOSIS — K219 Gastro-esophageal reflux disease without esophagitis: Secondary | ICD-10-CM | POA: Diagnosis present

## 2014-06-24 DIAGNOSIS — I5031 Acute diastolic (congestive) heart failure: Secondary | ICD-10-CM

## 2014-06-24 DIAGNOSIS — Z951 Presence of aortocoronary bypass graft: Secondary | ICD-10-CM

## 2014-06-24 DIAGNOSIS — Z7901 Long term (current) use of anticoagulants: Secondary | ICD-10-CM

## 2014-06-24 DIAGNOSIS — J440 Chronic obstructive pulmonary disease with acute lower respiratory infection: Secondary | ICD-10-CM | POA: Diagnosis present

## 2014-06-24 DIAGNOSIS — I25118 Atherosclerotic heart disease of native coronary artery with other forms of angina pectoris: Secondary | ICD-10-CM | POA: Diagnosis present

## 2014-06-24 DIAGNOSIS — I10 Essential (primary) hypertension: Secondary | ICD-10-CM | POA: Diagnosis present

## 2014-06-24 DIAGNOSIS — Z6841 Body Mass Index (BMI) 40.0 and over, adult: Secondary | ICD-10-CM

## 2014-06-24 DIAGNOSIS — I2699 Other pulmonary embolism without acute cor pulmonale: Secondary | ICD-10-CM | POA: Diagnosis present

## 2014-06-24 DIAGNOSIS — D631 Anemia in chronic kidney disease: Secondary | ICD-10-CM | POA: Diagnosis present

## 2014-06-24 DIAGNOSIS — Z86718 Personal history of other venous thrombosis and embolism: Secondary | ICD-10-CM

## 2014-06-24 DIAGNOSIS — E662 Morbid (severe) obesity with alveolar hypoventilation: Secondary | ICD-10-CM | POA: Diagnosis present

## 2014-06-24 DIAGNOSIS — N183 Chronic kidney disease, stage 3 (moderate): Secondary | ICD-10-CM | POA: Diagnosis present

## 2014-06-24 DIAGNOSIS — G4733 Obstructive sleep apnea (adult) (pediatric): Secondary | ICD-10-CM | POA: Diagnosis present

## 2014-06-24 DIAGNOSIS — Z955 Presence of coronary angioplasty implant and graft: Secondary | ICD-10-CM

## 2014-06-24 DIAGNOSIS — Z993 Dependence on wheelchair: Secondary | ICD-10-CM

## 2014-06-24 HISTORY — DX: Other forms of dyspnea: R06.09

## 2014-06-24 LAB — BASIC METABOLIC PANEL
Anion gap: 8 (ref 5–15)
BUN: 23 mg/dL (ref 6–23)
CO2: 25 mmol/L (ref 19–32)
Calcium: 8.6 mg/dL (ref 8.4–10.5)
Chloride: 101 mmol/L (ref 96–112)
Creatinine, Ser: 1.61 mg/dL — ABNORMAL HIGH (ref 0.50–1.10)
GFR calc Af Amer: 34 mL/min — ABNORMAL LOW (ref >90)
GFR calc non Af Amer: 30 mL/min — ABNORMAL LOW (ref >90)
Glucose, Bld: 272 mg/dL — ABNORMAL HIGH (ref 70–99)
Potassium: 5.1 mmol/L (ref 3.5–5.1)
Sodium: 134 mmol/L — ABNORMAL LOW (ref 135–145)

## 2014-06-24 LAB — CBC
HCT: 33.7 % — ABNORMAL LOW (ref 36.0–46.0)
Hemoglobin: 10.6 g/dL — ABNORMAL LOW (ref 12.0–15.0)
MCH: 27.4 pg (ref 26.0–34.0)
MCHC: 31.5 g/dL (ref 30.0–36.0)
MCV: 87.1 fL (ref 78.0–100.0)
Platelets: 280 10*3/uL (ref 150–400)
RBC: 3.87 MIL/uL (ref 3.87–5.11)
RDW: 15.3 % (ref 11.5–15.5)
WBC: 11 10*3/uL — ABNORMAL HIGH (ref 4.0–10.5)

## 2014-06-24 LAB — D-DIMER, QUANTITATIVE: D-Dimer, Quant: 0.3 ug/mL-FEU (ref 0.00–0.48)

## 2014-06-24 LAB — I-STAT TROPONIN, ED: Troponin i, poc: 0.01 ng/mL (ref 0.00–0.08)

## 2014-06-24 LAB — APTT: aPTT: 39 seconds — ABNORMAL HIGH (ref 24–37)

## 2014-06-24 LAB — PROTIME-INR
INR: 1.92 — ABNORMAL HIGH (ref 0.00–1.49)
Prothrombin Time: 22.1 seconds — ABNORMAL HIGH (ref 11.6–15.2)

## 2014-06-24 LAB — BRAIN NATRIURETIC PEPTIDE: B Natriuretic Peptide: 62.6 pg/mL (ref 0.0–100.0)

## 2014-06-24 NOTE — ED Notes (Signed)
Admitting MD at BS.  

## 2014-06-24 NOTE — Telephone Encounter (Signed)
Pt has experienced SOB for the past week.  She is taking Lasix appropriately.  Pt is having swelling in right hand and arm and right leg.  Pt also stated that left foot turns black at night but back to normal during the day. Pt has increase SOB with activity and it takes awhile for it to calm when sitting but can get SOB during conversation.  Pt does not check weight daily but has noticed that it has increased since a few days ago from 365 to 370 today.  Pt daughter with her and pt instructed to go to the emergency room. Pt daughter said she would like mother to go to ER, just to have things check out, as she sees that it has gotten worse over the past few days.  Pt was going to get dressed and go to emergency room once we hung up phone.

## 2014-06-24 NOTE — ED Provider Notes (Addendum)
CSN: [id number]     Arrival date & time 06/24/14  1809 History   First MD Initiated Contact with Patient 06/24/14 1954     Chief Complaint  Patient presents with  . Shortness of Breath     (Consider location/radiation/quality/duration/timing/severity/associated sxs/prior Treatment) HPI Comments: Pt comes in with cc of DIB. Pt has hx of DVT, CAD, COPD, diastolic heart failure, on coumadin. She reports that for the past 2 weeks she has had increased shortness of breath. At baseline, she is not very active, but at least able to get around the house w/o any trouble. Lately however, there is dyspnea, with miniinal exertion, and he symptoms are getting worse. Pt has occasionally had some chest discomfort, and L arm pain. She has a mild cough. No wheezing. There is no worsening orthopnea or PND like sx. No leg swelling.   ROS 10 Systems reviewed and are negative for acute change except as noted in the HPI.     Patient is a 80 y.o. female presenting with shortness of breath. The history is provided by the patient.  Shortness of Breath Associated symptoms: chest pain and cough   Associated symptoms: no diaphoresis     Past Medical History  Diagnosis Date  . COPD (chronic obstructive pulmonary disease)     patient denies this  . Diabetes mellitus   . Diverticular disease   . Hypertension   . Hyperlipidemia   . Coronary artery disease     a. s/p IMI 2004 tx with BMS to RCA;  b. s/p Promus DES to RCA 2/12 (cath: LM 20-30%, pLAD 20-30%, mLAD 50%, RI 30%, pRCA 60%, mRCA 90% - tx with PCI);  c. myoview 1/07: EF 49%, inf scar, no isch  . Urinary incontinence   . Insomnia   . Osteoarthritis   . Morbid obesity   . Obstructive sleep apnea   . Polymyalgia rheumatica   . Anxiety   . Arthritis   . Depression   . Cholelithiasis   . CHF (congestive heart failure)   . GERD (gastroesophageal reflux disease)   . Pulmonary emboli 9/13    felt to need lifelong anticoagulation  . Debility   .  Unspecified constipation   . Allergy   . Gout, unspecified   . Dysuria   . Urinary complications   . Pain, chronic   . Allergic rhinitis due to pollen   . Osteoarthrosis, unspecified whether generalized or localized, lower leg   . Type II or unspecified type diabetes mellitus without mention of complication, uncontrolled   . Anemia, unspecified   . Coronary atherosclerosis of native coronary artery   . Other malaise and fatigue    Past Surgical History  Procedure Laterality Date  . Coronary artery bypass graft    . Appendectomy    . Cardiac catheterization    . Gastric bypass  1977     reversed in 1979, [facility]  . Blood clots/legs and lungs  2013  . Mi with stent placement  2004   Family History  Problem Relation Age of Onset  . Breast cancer Mother   . Heart disease Mother   . Throat cancer Father   . Hypertension Father   . Arthritis Father   . Diabetes Father   . Arthritis Sister   . Obesity Sister   . Diabetes Sister    History  Substance Use Topics  . Smoking status: Never Smoker   . Smokeless tobacco: Not on file  . Alcohol Use: No  OB History    No data available     Review of Systems  Constitutional: Negative for diaphoresis.  Respiratory: Positive for cough and shortness of breath.   Cardiovascular: Positive for chest pain.  Gastrointestinal: Negative for nausea.  Neurological: Negative for dizziness.      Allergies  Sulfonamide derivatives  Home Medications   Prior to Admission medications   Medication Sig Start Date End Date Taking? Authorizing Provider  acetaminophen (TYLENOL) 325 MG tablet Take 2 tablets (650 mg total) by mouth every 6 (six) hours as needed for mild pain (or Fever >/= 101). 04/24/14  Yes Delfina Redwood, MD  albuterol (PROVENTIL HFA;VENTOLIN HFA) 108 (90 BASE) MCG/ACT inhaler Inhale 2 puffs into the lungs every 6 (six) hours as needed for wheezing or shortness of breath. 05/05/14  Yes Blanchie Serve, MD  CINNAMON  PO Take by mouth. 1 by mouth twice daily   Yes Historical Provider, MD  clonazePAM (KLONOPIN) 1 MG tablet Take one tablet in the morning and two tablets at bedtime for sleep Patient taking differently: Take 1-2 mg by mouth 2 (two) times daily. Take one tablet in the morning and two tablets at bedtime for sleep 03/09/14  Yes Tiffany L Reed, DO  colchicine (COLCRYS) 0.6 MG tablet Take 1 tablet by mouth every day for gout 09/24/13  Yes [name], NP  diphenhydrAMINE (BENADRYL) 25 MG tablet Take 50 mg by mouth at bedtime as needed for itching.   Yes Historical Provider, MD  DULoxetine (CYMBALTA) 60 MG capsule Take 1 capsule by mouth every day for anxiety 03/09/14  Yes Tiffany L Reed, DO  furosemide (LASIX) 20 MG tablet Take one tablet by mouth twice daily for edema 03/09/14  Yes Tiffany L Reed, DO  hydrocortisone cream 1 % Apply 1 application topically daily as needed for itching.   Yes Historical Provider, MD  Insulin Glargine (TOUJEO SOLOSTAR) 300 UNIT/ML SOPN Inject 120 Units into the skin daily. 03/09/14  Yes Tivis Ringer, RPH-CPP  Insulin Lispro, Human, (HUMALOG KWIKPEN) 200 UNIT/ML SOPN Inject 35 Units into the skin 2 (two) times daily with a meal. 06/08/14  Yes Tivis Ringer, RPH-CPP  isosorbide mononitrate (IMDUR) 60 MG 24 hr tablet Take 1 tablet (60 mg total) by mouth daily. 03/09/14  Yes Tiffany L Reed, DO  Linaclotide (LINZESS) 145 MCG CAPS capsule Take 1 capsule (145 mcg total) by mouth daily. Sample provided 05/05/14  Yes Blanchie Serve, MD  metoprolol succinate (TOPROL-XL) 25 MG 24 hr tablet Take 1 tablet by mouth once every day for blood pressure 03/09/14  Yes Tiffany L Reed, DO  oxyCODONE-acetaminophen (PERCOCET) 10-325 MG per tablet Take one tablet every 6 hours as needed for pain 06/11/14  Yes Tiffany L Reed, DO  pantoprazole (PROTONIX) 40 MG tablet Take 1 tablet by mouth every day 03/09/14  Yes Tiffany L Reed, DO  Phenylephrine-APAP-Guaifenesin (MUCINEX SINUS-MAX CONGESTION) 5-325-200 MG  TABS Take 1 tablet by mouth daily as needed (congestion).    Yes Historical Provider, MD  pravastatin (PRAVACHOL) 20 MG tablet Take 1 tablet by mouth once every day for cholesterol 03/09/14  Yes Tiffany L Reed, DO  warfarin (COUMADIN) 5 MG tablet Take 0.5-1 tablets (2.5-5 mg total) by mouth daily. Takes 7.5mg  on Tuesday, Thursday, and Saturday.  All other days take 5mg  Patient taking differently: Take 5-7.5 mg by mouth daily. Takes 7.5mg  on Tuesday, Thursday, and Saturday.  All other days take 5mg  04/24/14  Yes Delfina Redwood, MD  guaiFENesin-dextromethorphan [facility] DM) 100-10  MG/5ML syrup Take 5 mLs by mouth every 4 (four) hours as needed for cough. 05/05/14   Blanchie Serve, MD  nitroGLYCERIN (NITROSTAT) 0.4 MG SL tablet Take one tablet under the tongue every 5 minutes as needed for chest pain 09/24/13   [name], NP   BP 128/53 mmHg  Pulse 95  Temp(Src) 97.3 F (36.3 C) (Oral)  Resp 17  SpO2 100% Physical Exam  Constitutional: She is oriented to person, place, and time. She appears well-developed and well-nourished.  HENT:  Head: Normocephalic and atraumatic.  Eyes: EOM are normal. Pupils are equal, round, and reactive to light.  Neck: Neck supple. No JVD present.  Cardiovascular: Normal rate, regular rhythm and normal heart sounds.   Pulmonary/Chest: Effort normal. No respiratory distress. She has no wheezes. She has no rales.  Abdominal: Soft. She exhibits no distension. There is no tenderness. There is no rebound and no guarding.  Musculoskeletal: She exhibits no edema or tenderness.  Neurological: She is alert and oriented to person, place, and time.  Skin: Skin is warm and dry.  Nursing note and vitals reviewed.   ED Course  Procedures (including critical care time) Labs Review Labs Reviewed  CBC - Abnormal; Notable for the following:    WBC 11.0 (*)    Hemoglobin 10.6 (*)    HCT 33.7 (*)    All other components within normal limits  BASIC METABOLIC PANEL -  Abnormal; Notable for the following:    Sodium 134 (*)    Glucose, Bld 272 (*)    Creatinine, Ser 1.61 (*)    GFR calc non Af Amer 30 (*)    GFR calc Af Amer 34 (*)    All other components within normal limits  APTT - Abnormal; Notable for the following:    aPTT 39 (*)    All other components within normal limits  PROTIME-INR - Abnormal; Notable for the following:    Prothrombin Time 22.1 (*)    INR 1.92 (*)    All other components within normal limits  BRAIN NATRIURETIC PEPTIDE  D-DIMER, QUANTITATIVE  I-STAT TROPOININ, ED    Imaging Review Dg Chest 2 View  06/24/2014   CLINICAL DATA:  Shortness of breath for 2 weeks, LEFT arm pain radiating to LEFT chest, history coronary artery disease post MI and coronary stenting, hypertension, type 2 diabetes, CHF, hyperlipidemia, COPD  EXAM: CHEST  2 VIEW  COMPARISON:  04/21/2014  FINDINGS: Upper normal heart size.  Normal mediastinal contours.  Atherosclerotic calcification at aortic arch.  Minimal vascular congestion.  Decreased lung volumes since previous exam with mild bibasilar atelectasis.  Upper lungs clear.  No pleural effusion or pneumothorax.  Scattered endplate spur formation thoracic spine with generalized osseous demineralization.  IMPRESSION: Bibasilar atelectasis.   Electronically Signed   By: Lavonia [name] M.D.   On: 06/24/2014 19:35     EKG Interpretation   Date/Time:  Wednesday June 24 2014 18:15:52 EST Ventricular Rate:  102 PR Interval:  158 QRS Duration: 92 QT Interval:  364 QTC Calculation: 474 R Axis:   1 Text Interpretation:  Sinus tachycardia with occasional Premature  ventricular complexes Cannot rule out Inferior infarct , age undetermined  Abnormal ECG No acute changes No significant change since last tracing  Confirmed by Kathrynn Humble, MD, Thelma Comp [phone]) on 06/24/2014 8:19:51 PM      MDM   Final diagnoses:  Exertional dyspnea  Acute diastolic congestive heart failure    Pt comes in with worsening  shortness  of breath. Hx of CAD, diastolic CHF, COPD, DVT. Hx is not suggestive of CHF, and exam + labs support that as well. CXR doesn't show pulmonary edema. Pt has no signs of DVT - she is taking coumadin - but in light of no evidence of CHF, we have to consider PE in the differential. Pt has hx of DVT - and thus her WELLS score is 1.5. We have ordered a dimer. Pt can get VQ scan is dimer is elevated. Finally, pt has CAD hx, and if the dimer is neg, we have to entertain ACS in the diagnosis, with shortness of breath being angina equivalent. Likely will need admission.   Varney Biles, MD 06/24/14 2100    Varney Biles, MD 06/24/14 [id number]

## 2014-06-24 NOTE — Telephone Encounter (Signed)
Pt c/o Shortness Of Breath: STAT if SOB developed within the last 24 hours or pt is noticeably SOB on the phone  1. Are you currently SOB (can you hear that pt is SOB on the phone)? Yes 2. How long have you been experiencing SOB? Within 24 hours 3. Are you SOB when sitting or when up moving around? both 4.  Are you currently experiencing any other symptoms? No

## 2014-06-24 NOTE — ED Notes (Signed)
Pt reports sob today, having productive cough with clear sputum today and swelling to right side of body. Had one episode of left arm pain that radiated up her arm and into her chest. Hx of chf.

## 2014-06-24 NOTE — ED Notes (Signed)
Pt's 02 sats maintained at 97% with ambulation, however pt's HR was 130 when ambulating. Pt reports extreme SOB, breaks needed to be taken. PT ambulated with a walker.

## 2014-06-25 ENCOUNTER — Inpatient Hospital Stay (HOSPITAL_COMMUNITY): Payer: Medicare PPO

## 2014-06-25 DIAGNOSIS — I5033 Acute on chronic diastolic (congestive) heart failure: Secondary | ICD-10-CM | POA: Diagnosis present

## 2014-06-25 DIAGNOSIS — E1122 Type 2 diabetes mellitus with diabetic chronic kidney disease: Secondary | ICD-10-CM

## 2014-06-25 DIAGNOSIS — I10 Essential (primary) hypertension: Secondary | ICD-10-CM | POA: Diagnosis present

## 2014-06-25 DIAGNOSIS — R5381 Other malaise: Secondary | ICD-10-CM

## 2014-06-25 DIAGNOSIS — M353 Polymyalgia rheumatica: Secondary | ICD-10-CM | POA: Diagnosis present

## 2014-06-25 DIAGNOSIS — I2510 Atherosclerotic heart disease of native coronary artery without angina pectoris: Secondary | ICD-10-CM | POA: Diagnosis present

## 2014-06-25 DIAGNOSIS — Z794 Long term (current) use of insulin: Secondary | ICD-10-CM | POA: Diagnosis not present

## 2014-06-25 DIAGNOSIS — I252 Old myocardial infarction: Secondary | ICD-10-CM | POA: Diagnosis not present

## 2014-06-25 DIAGNOSIS — G4733 Obstructive sleep apnea (adult) (pediatric): Secondary | ICD-10-CM

## 2014-06-25 DIAGNOSIS — Z951 Presence of aortocoronary bypass graft: Secondary | ICD-10-CM | POA: Diagnosis not present

## 2014-06-25 DIAGNOSIS — Z955 Presence of coronary angioplasty implant and graft: Secondary | ICD-10-CM | POA: Diagnosis not present

## 2014-06-25 DIAGNOSIS — R0600 Dyspnea, unspecified: Secondary | ICD-10-CM

## 2014-06-25 DIAGNOSIS — R791 Abnormal coagulation profile: Secondary | ICD-10-CM | POA: Diagnosis present

## 2014-06-25 DIAGNOSIS — R0609 Other forms of dyspnea: Secondary | ICD-10-CM

## 2014-06-25 DIAGNOSIS — Z86718 Personal history of other venous thrombosis and embolism: Secondary | ICD-10-CM | POA: Diagnosis not present

## 2014-06-25 DIAGNOSIS — D631 Anemia in chronic kidney disease: Secondary | ICD-10-CM | POA: Diagnosis present

## 2014-06-25 DIAGNOSIS — E11649 Type 2 diabetes mellitus with hypoglycemia without coma: Secondary | ICD-10-CM | POA: Diagnosis not present

## 2014-06-25 DIAGNOSIS — Z7951 Long term (current) use of inhaled steroids: Secondary | ICD-10-CM | POA: Diagnosis not present

## 2014-06-25 DIAGNOSIS — K219 Gastro-esophageal reflux disease without esophagitis: Secondary | ICD-10-CM | POA: Diagnosis present

## 2014-06-25 DIAGNOSIS — J209 Acute bronchitis, unspecified: Secondary | ICD-10-CM | POA: Diagnosis present

## 2014-06-25 DIAGNOSIS — Z7901 Long term (current) use of anticoagulants: Secondary | ICD-10-CM | POA: Diagnosis not present

## 2014-06-25 DIAGNOSIS — Z9884 Bariatric surgery status: Secondary | ICD-10-CM | POA: Diagnosis not present

## 2014-06-25 DIAGNOSIS — Z993 Dependence on wheelchair: Secondary | ICD-10-CM | POA: Diagnosis not present

## 2014-06-25 DIAGNOSIS — Z9119 Patient's noncompliance with other medical treatment and regimen: Secondary | ICD-10-CM | POA: Diagnosis present

## 2014-06-25 DIAGNOSIS — J439 Emphysema, unspecified: Secondary | ICD-10-CM

## 2014-06-25 DIAGNOSIS — N183 Chronic kidney disease, stage 3 (moderate): Secondary | ICD-10-CM | POA: Diagnosis present

## 2014-06-25 DIAGNOSIS — I5032 Chronic diastolic (congestive) heart failure: Secondary | ICD-10-CM

## 2014-06-25 DIAGNOSIS — Z6841 Body Mass Index (BMI) 40.0 and over, adult: Secondary | ICD-10-CM | POA: Diagnosis not present

## 2014-06-25 DIAGNOSIS — E785 Hyperlipidemia, unspecified: Secondary | ICD-10-CM

## 2014-06-25 DIAGNOSIS — I25119 Atherosclerotic heart disease of native coronary artery with unspecified angina pectoris: Secondary | ICD-10-CM

## 2014-06-25 DIAGNOSIS — E1165 Type 2 diabetes mellitus with hyperglycemia: Secondary | ICD-10-CM | POA: Diagnosis present

## 2014-06-25 DIAGNOSIS — J440 Chronic obstructive pulmonary disease with acute lower respiratory infection: Secondary | ICD-10-CM | POA: Diagnosis present

## 2014-06-25 DIAGNOSIS — F419 Anxiety disorder, unspecified: Secondary | ICD-10-CM | POA: Diagnosis present

## 2014-06-25 DIAGNOSIS — R9431 Abnormal electrocardiogram [ECG] [EKG]: Secondary | ICD-10-CM

## 2014-06-25 DIAGNOSIS — F329 Major depressive disorder, single episode, unspecified: Secondary | ICD-10-CM | POA: Diagnosis present

## 2014-06-25 DIAGNOSIS — I272 Other secondary pulmonary hypertension: Secondary | ICD-10-CM

## 2014-06-25 DIAGNOSIS — Z79899 Other long term (current) drug therapy: Secondary | ICD-10-CM | POA: Diagnosis not present

## 2014-06-25 DIAGNOSIS — Z86711 Personal history of pulmonary embolism: Secondary | ICD-10-CM | POA: Diagnosis not present

## 2014-06-25 DIAGNOSIS — E662 Morbid (severe) obesity with alveolar hypoventilation: Secondary | ICD-10-CM | POA: Diagnosis present

## 2014-06-25 LAB — TROPONIN I
Troponin I: <0.03 ng/mL (ref <0.031)
Troponin I: <0.03 ng/mL (ref <0.031)
Troponin I: <0.03 ng/mL (ref <0.031)

## 2014-06-25 LAB — GLUCOSE, CAPILLARY
Glucose-Capillary: 293 mg/dL — ABNORMAL HIGH (ref 70–99)
Glucose-Capillary: 335 mg/dL — ABNORMAL HIGH (ref 70–99)

## 2014-06-25 LAB — TSH: TSH: 0.733 u[IU]/mL (ref 0.350–4.500)

## 2014-06-25 LAB — CBG MONITORING, ED
Glucose-Capillary: 279 mg/dL — ABNORMAL HIGH (ref 70–99)
Glucose-Capillary: 243 mg/dL — ABNORMAL HIGH (ref 70–99)
Glucose-Capillary: 187 mg/dL — ABNORMAL HIGH (ref 70–99)
Glucose-Capillary: 174 mg/dL — ABNORMAL HIGH (ref 70–99)

## 2014-06-25 LAB — PROTIME-INR
INR: 1.74 — ABNORMAL HIGH (ref 0.00–1.49)
Prothrombin Time: 20.5 seconds — ABNORMAL HIGH (ref 11.6–15.2)

## 2014-06-25 MED ORDER — WARFARIN SODIUM 10 MG PO TABS
10.0000 mg | ORAL_TABLET | Freq: Once | ORAL | Status: AC
  Administered 2014-06-25: 10 mg via ORAL
  Filled 2014-06-25 (×2): qty 1

## 2014-06-25 MED ORDER — PRAVASTATIN SODIUM 20 MG PO TABS
20.0000 mg | ORAL_TABLET | Freq: Every day | ORAL | Status: DC
  Administered 2014-06-25 – 2014-06-29 (×5): 20 mg via ORAL
  Filled 2014-06-25 (×7): qty 1

## 2014-06-25 MED ORDER — COLCHICINE 0.6 MG PO TABS
0.6000 mg | ORAL_TABLET | Freq: Every day | ORAL | Status: DC
  Administered 2014-06-26 – 2014-06-30 (×5): 0.6 mg via ORAL
  Filled 2014-06-25 (×5): qty 1

## 2014-06-25 MED ORDER — ALBUTEROL SULFATE (2.5 MG/3ML) 0.083% IN NEBU: 2.5000 mg | INHALATION_SOLUTION | Freq: Four times a day (QID) | RESPIRATORY_TRACT | Status: DC | PRN

## 2014-06-25 MED ORDER — FUROSEMIDE 20 MG PO TABS: 20.0000 mg | ORAL_TABLET | Freq: Two times a day (BID) | ORAL | Status: DC

## 2014-06-25 MED ORDER — INSULIN GLARGINE 100 UNIT/ML ~~LOC~~ SOLN
120.0000 [IU] | Freq: Every day | SUBCUTANEOUS | Status: DC
  Filled 2014-06-25 (×2): qty 1.2

## 2014-06-25 MED ORDER — INSULIN ASPART 100 UNIT/ML ~~LOC~~ SOLN
25.0000 [IU] | Freq: Two times a day (BID) | SUBCUTANEOUS | Status: DC
  Administered 2014-06-25: 25 [IU] via SUBCUTANEOUS

## 2014-06-25 MED ORDER — INSULIN GLARGINE 100 UNIT/ML ~~LOC~~ SOLN
120.0000 [IU] | Freq: Once | SUBCUTANEOUS | Status: AC
  Administered 2014-06-25: 120 [IU] via SUBCUTANEOUS
  Filled 2014-06-25: qty 1.2

## 2014-06-25 MED ORDER — ACETAMINOPHEN 325 MG PO TABS: 650.0000 mg | ORAL_TABLET | Freq: Four times a day (QID) | ORAL | Status: DC | PRN

## 2014-06-25 MED ORDER — INSULIN GLARGINE 100 UNIT/ML ~~LOC~~ SOLN
120.0000 [IU] | Freq: Every day | SUBCUTANEOUS | Status: DC
  Administered 2014-06-26: 30 [IU] via SUBCUTANEOUS
  Administered 2014-06-27: 120 [IU] via SUBCUTANEOUS
  Filled 2014-06-25 (×3): qty 1.2

## 2014-06-25 MED ORDER — OXYCODONE-ACETAMINOPHEN 10-325 MG PO TABS: 1.0000 | ORAL_TABLET | Freq: Four times a day (QID) | ORAL | Status: DC | PRN

## 2014-06-25 MED ORDER — METOPROLOL SUCCINATE ER 25 MG PO TB24
25.0000 mg | ORAL_TABLET | Freq: Every day | ORAL | Status: DC
  Administered 2014-06-25 – 2014-06-30 (×6): 25 mg via ORAL
  Filled 2014-06-25 (×6): qty 1

## 2014-06-25 MED ORDER — DULOXETINE HCL 60 MG PO CPEP
60.0000 mg | ORAL_CAPSULE | Freq: Every day | ORAL | Status: DC
  Administered 2014-06-25 – 2014-06-30 (×6): 60 mg via ORAL
  Filled 2014-06-25 (×6): qty 1

## 2014-06-25 MED ORDER — INSULIN ASPART 100 UNIT/ML ~~LOC~~ SOLN
0.0000 [IU] | Freq: Three times a day (TID) | SUBCUTANEOUS | Status: DC
  Administered 2014-06-25: 7 [IU] via SUBCUTANEOUS
  Administered 2014-06-25: 5 [IU] via SUBCUTANEOUS
  Administered 2014-06-26: 9 [IU] via SUBCUTANEOUS
  Administered 2014-06-27: 1 [IU] via SUBCUTANEOUS
  Administered 2014-06-27: 5 [IU] via SUBCUTANEOUS
  Administered 2014-06-27: 2 [IU] via SUBCUTANEOUS
  Administered 2014-06-28: 1 [IU] via SUBCUTANEOUS
  Administered 2014-06-28: 7 [IU] via SUBCUTANEOUS
  Administered 2014-06-28: 3 [IU] via SUBCUTANEOUS
  Administered 2014-06-29: 5 [IU] via SUBCUTANEOUS
  Administered 2014-06-30 (×2): 1 [IU] via SUBCUTANEOUS

## 2014-06-25 MED ORDER — SODIUM CHLORIDE 0.9 % IJ SOLN
3.0000 mL | Freq: Two times a day (BID) | INTRAMUSCULAR | Status: DC
  Administered 2014-06-25 – 2014-06-29 (×6): 3 mL via INTRAVENOUS
  Filled 2014-06-25: qty 3

## 2014-06-25 MED ORDER — CLONAZEPAM 0.5 MG PO TABS
1.0000 mg | ORAL_TABLET | Freq: Once | ORAL | Status: AC
  Administered 2014-06-25: 1 mg via ORAL
  Filled 2014-06-25: qty 2

## 2014-06-25 MED ORDER — ISOSORBIDE MONONITRATE ER 60 MG PO TB24
60.0000 mg | ORAL_TABLET | Freq: Every day | ORAL | Status: DC
  Administered 2014-06-25 – 2014-06-30 (×6): 60 mg via ORAL
  Filled 2014-06-25 (×6): qty 1

## 2014-06-25 MED ORDER — LINACLOTIDE 145 MCG PO CAPS
145.0000 ug | ORAL_CAPSULE | Freq: Every day | ORAL | Status: DC
  Administered 2014-06-25 – 2014-06-30 (×6): 145 ug via ORAL
  Filled 2014-06-25 (×6): qty 1

## 2014-06-25 MED ORDER — OXYCODONE HCL 5 MG PO TABS
5.0000 mg | ORAL_TABLET | Freq: Four times a day (QID) | ORAL | Status: DC | PRN
  Administered 2014-06-25 – 2014-06-29 (×10): 5 mg via ORAL
  Filled 2014-06-25 (×11): qty 1

## 2014-06-25 MED ORDER — WARFARIN - PHARMACIST DOSING INPATIENT: Freq: Every day | Status: DC

## 2014-06-25 MED ORDER — PANTOPRAZOLE SODIUM 40 MG PO TBEC
40.0000 mg | DELAYED_RELEASE_TABLET | Freq: Every day | ORAL | Status: DC
  Administered 2014-06-25 – 2014-06-30 (×6): 40 mg via ORAL
  Filled 2014-06-25 (×6): qty 1

## 2014-06-25 MED ORDER — OXYCODONE-ACETAMINOPHEN 5-325 MG PO TABS
1.0000 | ORAL_TABLET | Freq: Four times a day (QID) | ORAL | Status: DC | PRN
  Administered 2014-06-25 – 2014-06-29 (×5): 1 via ORAL
  Filled 2014-06-25 (×5): qty 1

## 2014-06-25 MED ORDER — INSULIN GLARGINE 300 UNIT/ML ~~LOC~~ SOPN: 120.0000 [IU] | PEN_INJECTOR | Freq: Every day | SUBCUTANEOUS | Status: DC

## 2014-06-25 MED ORDER — INSULIN ASPART 100 UNIT/ML ~~LOC~~ SOLN
0.0000 [IU] | SUBCUTANEOUS | Status: DC
  Administered 2014-06-25: 11 [IU] via SUBCUTANEOUS
  Filled 2014-06-25: qty 1

## 2014-06-25 MED ORDER — FUROSEMIDE 40 MG PO TABS
40.0000 mg | ORAL_TABLET | Freq: Two times a day (BID) | ORAL | Status: DC
  Administered 2014-06-25 – 2014-06-26 (×2): 40 mg via ORAL
  Filled 2014-06-25 (×4): qty 1

## 2014-06-25 MED ORDER — ASPIRIN EC 81 MG PO TBEC
81.0000 mg | DELAYED_RELEASE_TABLET | Freq: Every day | ORAL | Status: DC
  Administered 2014-06-26 – 2014-06-29 (×4): 81 mg via ORAL
  Filled 2014-06-25 (×4): qty 1

## 2014-06-25 MED ORDER — TECHNETIUM TC 99M SESTAMIBI GENERIC - CARDIOLITE: 30.0000 | Freq: Once | INTRAVENOUS | Status: AC | PRN

## 2014-06-25 MED ORDER — ALBUTEROL SULFATE (2.5 MG/3ML) 0.083% IN NEBU
2.5000 mg | INHALATION_SOLUTION | Freq: Four times a day (QID) | RESPIRATORY_TRACT | Status: DC
  Administered 2014-06-25 (×3): 2.5 mg via RESPIRATORY_TRACT
  Filled 2014-06-25 (×3): qty 3

## 2014-06-25 MED ORDER — INSULIN LISPRO 200 UNIT/ML ~~LOC~~ SOPN: 25.0000 [IU] | PEN_INJECTOR | Freq: Two times a day (BID) | SUBCUTANEOUS | Status: DC

## 2014-06-25 NOTE — ED Notes (Signed)
[name].

## 2014-06-25 NOTE — Progress Notes (Signed)
Attempted to get report from ed nurse with no answer. Left number for call back. [name]

## 2014-06-25 NOTE — Consult Note (Signed)
Reason for Consult: Chest pain  Requesting Physician: Hongalgi  Cardiologist: Meda Coffee  HPI: This is a 80 y.o. female with a past medical history significant for CAD with past inferior MI in 2004. She has had bare-metal stent to the RCA in 2004 and drug-eluting stent placement to the RCA in 06/2010 in the setting of unstable angina with overall preserved LV function. Last cath in 2012 showing otherwise nonobstructive disease. Her other problems include morbid obesity, severe OA - basically confined to a wheelchair, COPD, DM, HTN, HLD, incontinence, OSA, polymyalgia rheumatica, anxiety, depression and gallbladder disease. She is on chronic anticoagulation for PE since September of 2013. Anticoagulation is subtherapeutic today. She presents with roughly 2 weeks of progressively worsening exertional dyspnea, but without dyspnea at rest or chest pain. Previous cardiac workup listed below. Chest x-ray shows minimal vascular congestion and is hard to interpret in the setting of super morbid obesity. Notes normal BNP, also possibly misleading in the setting of severe obesity. Her ECG today shows an isolated inverted T-wave in lead aVL that is however new from previous tracings. Her physical exam is severely limited by super morbid obesity.  FEB 2014 Nuclear study [facility]) Inferior and anterior reduced perfusion, not reversible  ECHO 2013 - Left ventricle: Difficult study. LV wall motion is good. Ido not see an obvious wall motion abnormality. The cavity size was normal. Wall thickness was increased in a patternof mild LVH. The estimated ejection fraction was 60%. Regional wall motion abnormalities cannot be excluded. - Right ventricle: The cavity size was moderately dilated.Systolic function was moderately reduced. RV systolic pressure is probably in the 54mmHg range. - Right atrium: The atrium was mildly dilated.   CORONARY ANGIOGRAPHY from Feb. 2012  1. The left mainstem is  calcified. There is 20% to 30% distal left  main stenosis.  2. LAD: The LAD has mild diffuse stenosis. There is moderate  calcification present. The vessel gives off two diagonal branches.  The second is the larger branch. The proximal LAD has 20% to 30%  stenosis. The mid LAD has 50% stenosis.  3. Left circumflex: The left circumflex is also calcified. The  vessel is patent throughout. There is nonobstructive stenosis in  the ramus intermedius which is the main branch vessel of the  circumflex. This has approximately 30% to 40% stenosis.  4. Right coronary artery. The right coronary artery is a large,  dominant vessel. There is a 60% proximal stenosis. The mid vessel  has an eccentric 90% lesion which is markedly progressed from the  previous study. The PDA and posterolateral branches are both  widely patent. There is moderate calcification throughout the  right coronary artery.  5. Left ventriculography shows normal LV function. The ejection  fraction is estimated at 55%.   ASSESSMENT:  1. Severe right coronary artery stenosis with successful percutaneous  intervention using a long drug-eluting stent in the mid vessel and  a 16-mm drug-eluting stents in the proximal vessel.  2. Nonobstructive left main, left anterior descending, and left  circumflex stenoses.  3. Normal left ventricular function.   PMHx:  Past Medical History  Diagnosis Date  . COPD (chronic obstructive pulmonary disease)     patient denies this  . Diabetes mellitus   . Diverticular disease   . Hypertension   . Hyperlipidemia   . Coronary artery disease     a. s/p IMI 2004 tx with BMS to RCA;  b. s/p Promus DES to RCA 2/12 (cath:  LM 20-30%, pLAD 20-30%, mLAD 50%, RI 30%, pRCA 60%, mRCA 90% - tx with PCI);  c. myoview 1/07: EF 49%, inf scar, no isch  . Urinary incontinence   . Insomnia   . Osteoarthritis   . Morbid obesity   . Obstructive sleep apnea   . Polymyalgia rheumatica   .  Anxiety   . Arthritis   . Depression   . Cholelithiasis   . CHF (congestive heart failure)   . GERD (gastroesophageal reflux disease)   . Pulmonary emboli 9/13    felt to need lifelong anticoagulation  . Debility   . Unspecified constipation   . Allergy   . Gout, unspecified   . Dysuria   . Urinary complications   . Pain, chronic   . Allergic rhinitis due to pollen   . Osteoarthrosis, unspecified whether generalized or localized, lower leg   . Type II or unspecified type diabetes mellitus without mention of complication, uncontrolled   . Anemia, unspecified   . Coronary atherosclerosis of native coronary artery   . Other malaise and fatigue    Past Surgical History  Procedure Laterality Date  . Coronary artery bypass graft    . Appendectomy    . Cardiac catheterization    . Gastric bypass  1977     reversed in [facility], [facility]  . Blood clots/legs and lungs  2013  . Mi with stent placement  2004    FAMHx: Family History  Problem Relation Age of Onset  . Breast cancer Mother   . Heart disease Mother   . Throat cancer Father   . Hypertension Father   . Arthritis Father   . Diabetes Father   . Arthritis Sister   . Obesity Sister   . Diabetes Sister     SOCHx:  reports that she has never smoked. She does not have any smokeless tobacco history on file. She reports that she does not drink alcohol or use illicit drugs.  ALLERGIES: Allergies  Allergen Reactions  . Sulfonamide Derivatives Swelling    To mouth    ROS: Constitutional: Negative for malaise, fever and chills. No significant weight loss or weight gain Eyes: Negative for eye pain, redness and discharge, diplopia, visual changes, or flashes of light. ENMT: Negative for ear pain, hoarseness, nasal congestion, sinus pressure and sore throat. No headaches; tinnitus, drooling, or problem swallowing. Cardiovascular: Negative for chest pain, palpitations, diaphoresis, dyspnea and peripheral edema. ; No  orthopnea, PND Respiratory: Negative for cough, hemoptysis, wheezing and stridor. No pleuritic chestpain. Gastrointestinal: Negative for nausea, vomiting, diarrhea, constipation, abdominal pain, melena, blood in stool, hematemesis, jaundice and rectal bleeding.  Genitourinary: Negative for frequency, dysuria, incontinence,flank pain and hematuria; Musculoskeletal: Negative for back pain and neck pain. Negative for swelling and trauma.;  Skin: . Negative for pruritus, rash, abrasions, bruising and skin lesion.; ulcerations Neuro: Negative for headache, lightheadedness and neck stiffness. Negative for weakness, altered level of consciousness , altered mental status, extremity weakness, burning feet, involuntary movement, seizure and syncope.  Psych: negative for anxiety, depression, insomnia, tearfulness, panic attacks, hallucinations, paranoia, suicidal or homicidal ideation   HOME MEDICATIONS: No current facility-administered medications on file prior to encounter.   Current Outpatient Prescriptions on File Prior to Encounter  Medication Sig Dispense Refill  . acetaminophen (TYLENOL) 325 MG tablet Take 2 tablets (650 mg total) by mouth every 6 (six) hours as needed for mild pain (or Fever >/= 101).    [name] albuterol (PROVENTIL HFA;VENTOLIN HFA) 108 (90 BASE) MCG/ACT  inhaler Inhale 2 puffs into the lungs every 6 (six) hours as needed for wheezing or shortness of breath. 1 Inhaler 0  . CINNAMON PO Take by mouth. 1 by mouth twice daily    . clonazePAM (KLONOPIN) 1 MG tablet Take one tablet in the morning and two tablets at bedtime for sleep (Patient taking differently: Take 1-2 mg by mouth 2 (two) times daily. Take one tablet in the morning and two tablets at bedtime for sleep) 90 tablet 3  . colchicine (COLCRYS) 0.6 MG tablet Take 1 tablet by mouth every day for gout 30 tablet 3  . diphenhydrAMINE (BENADRYL) 25 MG tablet Take 50 mg by mouth at bedtime as needed for itching.    . DULoxetine  (CYMBALTA) 60 MG capsule Take 1 capsule by mouth every day for anxiety 90 capsule 1  . furosemide (LASIX) 20 MG tablet Take one tablet by mouth twice daily for edema 180 tablet 1  . hydrocortisone cream 1 % Apply 1 application topically daily as needed for itching.    . Insulin Glargine (TOUJEO SOLOSTAR) 300 UNIT/ML SOPN Inject 120 Units into the skin daily. 15 mL 6  . Insulin Lispro, Human, (HUMALOG KWIKPEN) 200 UNIT/ML SOPN Inject 35 Units into the skin 2 (two) times daily with a meal. 12 mL 6  . isosorbide mononitrate (IMDUR) 60 MG 24 hr tablet Take 1 tablet (60 mg total) by mouth daily. 90 tablet 1  . Linaclotide (LINZESS) 145 MCG CAPS capsule Take 1 capsule (145 mcg total) by mouth daily. Sample provided 30 capsule 3  . metoprolol succinate (TOPROL-XL) 25 MG 24 hr tablet Take 1 tablet by mouth once every day for blood pressure 90 tablet 1  . oxyCODONE-acetaminophen (PERCOCET) 10-325 MG per tablet Take one tablet every 6 hours as needed for pain 120 tablet 0  . pantoprazole (PROTONIX) 40 MG tablet Take 1 tablet by mouth every day 90 tablet 1  . Phenylephrine-APAP-Guaifenesin (MUCINEX SINUS-MAX CONGESTION) 5-325-200 MG TABS Take 1 tablet by mouth daily as needed (congestion).     . pravastatin (PRAVACHOL) 20 MG tablet Take 1 tablet by mouth once every day for cholesterol 90 tablet 1  . warfarin (COUMADIN) 5 MG tablet Take 0.5-1 tablets (2.5-5 mg total) by mouth daily. Takes 7.5mg  on Tuesday, Thursday, and Saturday.  All other days take 5mg  (Patient taking differently: Take 5-7.5 mg by mouth daily. Takes 7.5mg  on Tuesday, Thursday, and Saturday.  All other days take 5mg ) 30 tablet 1  . guaiFENesin-dextromethorphan (ROBITUSSIN DM) 100-10 MG/5ML syrup Take 5 mLs by mouth every 4 (four) hours as needed for cough. 118 mL 0  . nitroGLYCERIN (NITROSTAT) 0.4 MG SL tablet Take one tablet under the tongue every 5 minutes as needed for chest pain 50 tablet 0    VITALS: Blood pressure 117/70, pulse 77,  temperature 97.6 F (36.4 C), temperature source Oral, resp. rate 16, SpO2 100 %.  PHYSICAL EXAM: Severely limited by body habitus General: Alert, oriented x3, no distress Head: no evidence of trauma, PERRL, EOMI, no exophtalmos or lid lag, no myxedema, no xanthelasma; normal ears, nose and oropharynx Neck: Unable to see jugular venous pulsations a0r hepatojugular reflux; brisk carotid pulses without delay and no carotid bruits Chest: clear to auscultation, no signs of consolidation by percussion or palpation, normal fremitus, symmetrical and full respiratory excursions Cardiovascular: normal position and quality of the apical impulse, regular rhythm, normal first heart sound and normal second heart sound, no rubs or gallops, no murmur Abdomen: no tenderness  or distention, no masses by palpation, no abnormal pulsatility or arterial bruits, normal bowel sounds, no hepatosplenomegaly Extremities: no clubbing, cyanosis;  trivial pedal edema; 2+ radial, ulnar and brachial pulses bilaterally; 2+ right femoral, posterior tibial and dorsalis pedis pulses; 2+ left femoral, posterior tibial and dorsalis pedis pulses; no subclavian or femoral bruits Neurological: grossly nonfocal   LABS  CBC  Recent Labs  06/24/14 1821  WBC 11.0*  HGB 10.6*  HCT 33.7*  MCV 87.1  PLT 123456   Basic Metabolic Panel  Recent Labs  06/24/14 1821  NA 134*  K 5.1  CL 101  CO2 25  GLUCOSE 272*  BUN 23  CREATININE 1.61*  CALCIUM 8.6   Liver Function Tests No results for input(s): AST, ALT, ALKPHOS, BILITOT, PROT, ALBUMIN in the last 72 hours. No results for input(s): LIPASE, AMYLASE in the last 72 hours. Cardiac Enzymes  Recent Labs  06/25/14 0155 06/25/14 0748  TROPONINI <0.03 <0.03   BNP Invalid input(s): POCBNP D-Dimer  Recent Labs  06/24/14 1821  DDIMER 0.30   Hemoglobin A1C No results for input(s): HGBA1C in the last 72 hours. Fasting Lipid Panel No results for input(s): CHOL, HDL,  LDLCALC, TRIG, CHOLHDL, LDLDIRECT in the last 72 hours. Thyroid Function Tests  Recent Labs  06/25/14 0155  TSH 0.733      IMAGING: Dg Chest 2 View  06/24/2014   CLINICAL DATA:  Shortness of breath for 2 weeks, LEFT arm pain radiating to LEFT chest, history coronary artery disease post MI and coronary stenting, hypertension, type 2 diabetes, CHF, hyperlipidemia, COPD  EXAM: CHEST  2 VIEW  COMPARISON:  04/21/2014  FINDINGS: Upper normal heart size.  Normal mediastinal contours.  Atherosclerotic calcification at aortic arch.  Minimal vascular congestion.  Decreased lung volumes since previous exam with mild bibasilar atelectasis.  Upper lungs clear.  No pleural effusion or pneumothorax.  Scattered endplate spur formation thoracic spine with generalized osseous demineralization.  IMPRESSION: Bibasilar atelectasis.   Electronically Signed   By: Lavonia [name] M.D.   On: 06/24/2014 19:35    ECG: NSR, inverted T waves in aVL are new  TELEMETRY: NSR  IMPRESSION: 1. Exertional dyspnea, agree that is likely multifactorial related to chronic obstructive lung disease, obstructive sleep apnea with noncompliance with CPAP, super morbid obesity, may be even residual pulmonary hypertension after venous thromboembolic event as well as possible contribution of exacerbation of chronic diastolic heart failure. An echocardiogram has been ordered and Doppler parameters may help ascertain whether or not she has elevated filling pressures. The BNP can be completely normal in the setting of severe obesity despite the presence of heart failure with preserved ejection fraction. 2. Abnormal ECG. The changes are fairly minimal but she has numerous risk factors for coronary disease progression. Low risk cardiac enzymes. She is not a good candidate for invasive angiography due to chronic kidney disease and diabetes mellitus. Unfortunately nuclear imaging may also be misleading due to attenuation artifact. Not withstanding  these concerns, I believe noninvasive evaluation with a nuclear perfusion study should be the first step. Nuclear perfusion study will have to be performed as a 2 day procedure due to obesity. Since she has had breakfast already will do the resting images today and the stress images in the morning.    Time Spent Directly with Patient: 45 minutes  Sanda Klein, MD, [facility] [phone] office [phone] pager   06/25/2014, 9:04 AM

## 2014-06-25 NOTE — Progress Notes (Signed)
  Echocardiogram 2D Echocardiogram has been performed.  Diamond Nickel 06/25/2014, 10:43 AM

## 2014-06-25 NOTE — Clinical Documentation Improvement (Signed)
Possible Clinical Conditions?  Cardio-Renal Syndrome Chronic Kidney Disease Acute on Chronic Diastolic Heart Failure  Other Condition Cannot Clinically Determine   Supporting Information: Risk Factors:(As per notes) Pt has CKD stage 3 and Acute on Chronic Diastolic CHF Signs & Symptoms (As per notes):"Exertional dyspnea, agree that is likely multifactorial related to chronic obstructive lung disease, obstructive sleep apnea with noncompliance with CPAP, super morbid obesity, may be even residual pulmonary hypertension after venous thromboembolic event as well as possible contribution of exacerbation of chronic diastolic heart failure."    Thank You, Alessandra Grout, RN, BSN, CCDS,Clinical Documentation Specialist:  [phone]  [phone]=[employer]- Health Information Management

## 2014-06-25 NOTE — ED Notes (Signed)
Admitting MD paged 

## 2014-06-25 NOTE — H&P (Signed)
[facility] History and Physical  [name] [id number] DOB: [birth year]    PCP:   Blanchie Serve, MD   Chief Complaint: DOE for the past 2 weeks.   HPI: [name] is an 80 y.o. female morbidly obese, with hx of sleep apnea CPAP non compliant, COPD, known CAD s/p 2 stent placement several years ago, hx of chronic diastolic CHF, hx of DM, HTN, HLD, presents to the ER with increase DOE for the past 2 weeks.  She has no orthopnea or PND, and denied exertional CP.  There has been no coughs, fever, chills, but she has audible wheezing.  Evaluation in the ER showed EKG with NSR and no acute ST T changes, negative initial troponin, and negative D Dimer.  Her INR was 1.92, BNP of only 62, and her CXR showed only minimal vascular congestion.  Hospitalist was asked to admit her for further evaluation, with concern for cardiac ischemia.   Rewiew of Systems:  Constitutional: Negative for malaise, fever and chills. No significant weight loss or weight gain Eyes: Negative for eye pain, redness and discharge, diplopia, visual changes, or flashes of light. ENMT: Negative for ear pain, hoarseness, nasal congestion, sinus pressure and sore throat. No headaches; tinnitus, drooling, or problem swallowing. Cardiovascular: Negative for chest pain, palpitations, diaphoresis, dyspnea and peripheral edema. ; No orthopnea, PND Respiratory: Negative for cough, hemoptysis, wheezing and stridor. No pleuritic chestpain. Gastrointestinal: Negative for nausea, vomiting, diarrhea, constipation, abdominal pain, melena, blood in stool, hematemesis, jaundice and rectal bleeding.    Genitourinary: Negative for frequency, dysuria, incontinence,flank pain and hematuria; Musculoskeletal: Negative for back pain and neck pain. Negative for swelling and trauma.;  Skin: . Negative for pruritus, rash, abrasions, bruising and skin lesion.; ulcerations Neuro: Negative for headache, lightheadedness and neck stiffness.  Negative for weakness, altered level of consciousness , altered mental status, extremity weakness, burning feet, involuntary movement, seizure and syncope.  Psych: negative for anxiety, depression, insomnia, tearfulness, panic attacks, hallucinations, paranoia, suicidal or homicidal ideation    Past Medical History  Diagnosis Date  . COPD (chronic obstructive pulmonary disease)     patient denies this  . Diabetes mellitus   . Diverticular disease   . Hypertension   . Hyperlipidemia   . Coronary artery disease     a. s/p IMI 2004 tx with BMS to RCA;  b. s/p Promus DES to RCA 2/12 (cath: LM 20-30%, pLAD 20-30%, mLAD 50%, RI 30%, pRCA 60%, mRCA 90% - tx with PCI);  c. myoview 1/07: EF 49%, inf scar, no isch  . Urinary incontinence   . Insomnia   . Osteoarthritis   . Morbid obesity   . Obstructive sleep apnea   . Polymyalgia rheumatica   . Anxiety   . Arthritis   . Depression   . Cholelithiasis   . CHF (congestive heart failure)   . GERD (gastroesophageal reflux disease)   . Pulmonary emboli 9/13    felt to need lifelong anticoagulation  . Debility   . Unspecified constipation   . Allergy   . Gout, unspecified   . Dysuria   . Urinary complications   . Pain, chronic   . Allergic rhinitis due to pollen   . Osteoarthrosis, unspecified whether generalized or localized, lower leg   . Type II or unspecified type diabetes mellitus without mention of complication, uncontrolled   . Anemia, unspecified   . Coronary atherosclerosis of native coronary artery   . Other malaise and fatigue  Past Surgical History  Procedure Laterality Date  . Coronary artery bypass graft    . Appendectomy    . Cardiac catheterization    . Gastric bypass  1977     reversed in 1979, [facility]  . Blood clots/legs and lungs  2013  . Mi with stent placement  2004    Medications:  HOME MEDS: Prior to Admission medications   Medication Sig Start Date End Date Taking? Authorizing Provider   acetaminophen (TYLENOL) 325 MG tablet Take 2 tablets (650 mg total) by mouth every 6 (six) hours as needed for mild pain (or Fever >/= 101). 04/24/14  Yes Delfina Redwood, MD  albuterol (PROVENTIL HFA;VENTOLIN HFA) 108 (90 BASE) MCG/ACT inhaler Inhale 2 puffs into the lungs every 6 (six) hours as needed for wheezing or shortness of breath. 05/05/14  Yes Blanchie Serve, MD  CINNAMON PO Take by mouth. 1 by mouth twice daily   Yes Historical Provider, MD  clonazePAM (KLONOPIN) 1 MG tablet Take one tablet in the morning and two tablets at bedtime for sleep Patient taking differently: Take 1-2 mg by mouth 2 (two) times daily. Take one tablet in the morning and two tablets at bedtime for sleep 03/09/14  Yes Tiffany L Reed, DO  colchicine (COLCRYS) 0.6 MG tablet Take 1 tablet by mouth every day for gout 09/24/13  Yes [name], NP  diphenhydrAMINE (BENADRYL) 25 MG tablet Take 50 mg by mouth at bedtime as needed for itching.   Yes Historical Provider, MD  DULoxetine (CYMBALTA) 60 MG capsule Take 1 capsule by mouth every day for anxiety 03/09/14  Yes Tiffany L Reed, DO  furosemide (LASIX) 20 MG tablet Take one tablet by mouth twice daily for edema 03/09/14  Yes Tiffany L Reed, DO  hydrocortisone cream 1 % Apply 1 application topically daily as needed for itching.   Yes Historical Provider, MD  Insulin Glargine (TOUJEO SOLOSTAR) 300 UNIT/ML SOPN Inject 120 Units into the skin daily. 03/09/14  Yes Tivis Ringer, RPH-CPP  Insulin Lispro, Human, (HUMALOG KWIKPEN) 200 UNIT/ML SOPN Inject 35 Units into the skin 2 (two) times daily with a meal. 06/08/14  Yes Tivis Ringer, RPH-CPP  isosorbide mononitrate (IMDUR) 60 MG 24 hr tablet Take 1 tablet (60 mg total) by mouth daily. 03/09/14  Yes Tiffany L Reed, DO  Linaclotide (LINZESS) 145 MCG CAPS capsule Take 1 capsule (145 mcg total) by mouth daily. Sample provided 05/05/14  Yes Blanchie Serve, MD  metoprolol succinate (TOPROL-XL) 25 MG 24 hr tablet Take 1 tablet by  mouth once every day for blood pressure 03/09/14  Yes Tiffany L Reed, DO  oxyCODONE-acetaminophen (PERCOCET) 10-325 MG per tablet Take one tablet every 6 hours as needed for pain 06/11/14  Yes Tiffany L Reed, DO  pantoprazole (PROTONIX) 40 MG tablet Take 1 tablet by mouth every day 03/09/14  Yes Tiffany L Reed, DO  Phenylephrine-APAP-Guaifenesin (MUCINEX SINUS-MAX CONGESTION) 5-325-200 MG TABS Take 1 tablet by mouth daily as needed (congestion).    Yes Historical Provider, MD  pravastatin (PRAVACHOL) 20 MG tablet Take 1 tablet by mouth once every day for cholesterol 03/09/14  Yes Tiffany L Reed, DO  warfarin (COUMADIN) 5 MG tablet Take 0.5-1 tablets (2.5-5 mg total) by mouth daily. Takes 7.5mg  on Tuesday, Thursday, and Saturday.  All other days take 5mg  Patient taking differently: Take 5-7.5 mg by mouth daily. Takes 7.5mg  on Tuesday, Thursday, and Saturday.  All other days take 5mg  04/24/14  Yes Delfina Redwood, MD  guaiFENesin-dextromethorphan (ROBITUSSIN DM) 100-10 MG/5ML syrup Take 5 mLs by mouth every 4 (four) hours as needed for cough. 05/05/14   Blanchie Serve, MD  nitroGLYCERIN (NITROSTAT) 0.4 MG SL tablet Take one tablet under the tongue every 5 minutes as needed for chest pain 09/24/13   [name], NP     Allergies:  Allergies  Allergen Reactions  . Sulfonamide Derivatives Swelling    To mouth    Social History:   reports that she has never smoked. She does not have any smokeless tobacco history on file. She reports that she does not drink alcohol or use illicit drugs.  Family History: Family History  Problem Relation Age of Onset  . Breast cancer Mother   . Heart disease Mother   . Throat cancer Father   . Hypertension Father   . Arthritis Father   . Diabetes Father   . Arthritis Sister   . Obesity Sister   . Diabetes Sister      Physical Exam: Filed Vitals:   06/24/14 2230 06/24/14 2245 06/24/14 2300 06/24/14 2315  BP: 119/60 114/77 113/99 128/53  Pulse: 87 86  85 95  Temp:      TempSrc:      Resp: 16 18 15 17   SpO2: 99% 99% 99% 100%   Blood pressure 128/53, pulse 95, temperature 97.3 F (36.3 C), temperature source Oral, resp. rate 17, SpO2 100 %.  GEN:  Pleasant patient lying in the stretcher in no acute distress; cooperative with exam. PSYCH:  alert and oriented x4; does not appear anxious or depressed; affect is appropriate. HEENT: Mucous membranes pink and anicteric; PERRLA; EOM intact; no cervical lymphadenopathy nor thyromegaly or carotid bruit; no JVD; There were no stridor. Neck is very supple. Breasts:: Not examined CHEST WALL: No tenderness CHEST: Normal respiration, clear to auscultation bilaterally. Minimal wheezes heard.  HEART: Regular rate and rhythm.  There are no murmur, rub, or gallops.   BACK: No kyphosis or scoliosis; no CVA tenderness ABDOMEN: soft and non-tender; no masses, no organomegaly, normal abdominal bowel sounds; no pannus; no intertriginous candida. There is no rebound and no distention. Rectal Exam: Not done EXTREMITIES: No bone or joint deformity; age-appropriate arthropathy of the hands and knees; no edema; no ulcerations.  There is no calf tenderness. Genitalia: not examined PULSES: 2+ and symmetric SKIN: Normal hydration no rash or ulceration CNS: Cranial nerves 2-12 grossly intact no focal lateralizing neurologic deficit.  Speech is fluent; uvula elevated with phonation, facial symmetry and tongue midline. DTR are normal bilaterally, cerebella exam is intact, barbinski is negative and strengths are equaled bilaterally.  No sensory loss.   Labs on Admission:  Basic Metabolic Panel:  Recent Labs Lab 06/24/14 1821  NA 134*  K 5.1  CL 101  CO2 25  GLUCOSE 272*  BUN 23  CREATININE 1.61*  CALCIUM 8.6    Recent Labs Lab 06/24/14 1821  WBC 11.0*  HGB 10.6*  HCT 33.7*  MCV 87.1  PLT 280   Radiological Exams on Admission: Dg Chest 2 View  06/24/2014   CLINICAL DATA:  Shortness of breath for 2  weeks, LEFT arm pain radiating to LEFT chest, history coronary artery disease post MI and coronary stenting, hypertension, type 2 diabetes, CHF, hyperlipidemia, COPD  EXAM: CHEST  2 VIEW  COMPARISON:  04/21/2014  FINDINGS: Upper normal heart size.  Normal mediastinal contours.  Atherosclerotic calcification at aortic arch.  Minimal vascular congestion.  Decreased lung volumes since previous exam with mild bibasilar  atelectasis.  Upper lungs clear.  No pleural effusion or pneumothorax.  Scattered endplate spur formation thoracic spine with generalized osseous demineralization.  IMPRESSION: Bibasilar atelectasis.   Electronically Signed   By: Lavonia [name] M.D.   On: 06/24/2014 19:35    EKG: Independently reviewed. NSR with no acute ST T changes.    Assessment/Plan Present on Admission:  . DOE (dyspnea on exertion) . COPD (chronic obstructive pulmonary disease) . Chronic diastolic congestive heart failure . CAD (coronary artery disease) . Debility . DM type 2 causing CKD stage 3 . Essential hypertension . Hyperlipidemia . OSA (obstructive sleep apnea) . Pulmonary embolus  PLAN:  She has several reasons for her SOB, including COPD, morbid obesity, possible pulmonary HTN from OSA with CPAP non compliance, deconditioning, and acute on chronic diastolic CHF.  What precipitated her increase DOE 2 weeks ago, I am not certain.  The concern from the EDP is for cardiac ischemia, so cardiology consultation for another nuclear stress test should be considered.  In the interim, will give her neb Tx, increase her oral lasix slightly, and obtain a cardiac ECHO.  Will cycle her troponins as well.  Her other medical problems are stable, and her home meds will be continued.  For her DM, will add SSI to her regimen.  She is stable, full code, and will be admitted to [facility] service.  Thank you for allowing me to participate in her care.   Other plans as per orders.  Code Status: FULL Haskel Khan, MD. [facility] Pager [phone] 7pm to 7am.  06/25/2014, 12:09 AM

## 2014-06-25 NOTE — Discharge Instructions (Signed)

## 2014-06-25 NOTE — Progress Notes (Signed)
Pt is not compliant with CPAP at home. Does not wish to wear here. RT will monitor.

## 2014-06-25 NOTE — Progress Notes (Signed)
ANTICOAGULATION CONSULT NOTE - Initial Consult  Pharmacy Consult for Coumadin Indication: h/o PE  Allergies  Allergen Reactions  . Sulfonamide Derivatives Swelling    To mouth     Vital Signs: Temp: 97.3 F (36.3 C) (02/17 1954) Temp Source: Oral (02/17 1954) BP: 128/53 mmHg (02/17 2315) Pulse Rate: 95 (02/17 2315)  Labs:  Recent Labs  06/24/14 1821  HGB 10.6*  HCT 33.7*  PLT 280  APTT 39*  LABPROT 22.1*  INR 1.92*  CREATININE 1.61*    CrCl cannot be calculated (Unknown ideal weight.).   Medical History: Past Medical History  Diagnosis Date  . COPD (chronic obstructive pulmonary disease)     patient denies this  . Diabetes mellitus   . Diverticular disease   . Hypertension   . Hyperlipidemia   . Coronary artery disease     a. s/p IMI 2004 tx with BMS to RCA;  b. s/p Promus DES to RCA 2/12 (cath: LM 20-30%, pLAD 20-30%, mLAD 50%, RI 30%, pRCA 60%, mRCA 90% - tx with PCI);  c. myoview 1/07: EF 49%, inf scar, no isch  . Urinary incontinence   . Insomnia   . Osteoarthritis   . Morbid obesity   . Obstructive sleep apnea   . Polymyalgia rheumatica   . Anxiety   . Arthritis   . Depression   . Cholelithiasis   . CHF (congestive heart failure)   . GERD (gastroesophageal reflux disease)   . Pulmonary emboli 9/13    felt to need lifelong anticoagulation  . Debility   . Unspecified constipation   . Allergy   . Gout, unspecified   . Dysuria   . Urinary complications   . Pain, chronic   . Allergic rhinitis due to pollen   . Osteoarthrosis, unspecified whether generalized or localized, lower leg   . Type II or unspecified type diabetes mellitus without mention of complication, uncontrolled   . Anemia, unspecified   . Coronary atherosclerosis of native coronary artery   . Other malaise and fatigue       Assessment: 80yo female c/o SOB w/ clear sputum, had episode of LUE pain radiating to chest, has h/o CHF, CXR negative for pulm edema, to continue  Coumadin during admission for further w/u; last dose of Coumadin taken 12/17 PTA (though reports taking 5mg  instead of prescribed 7.5mg  on Tuesdays) with current INR just below goal.  Goal of Therapy:  INR 2-3   Plan:  Will give boosted dose of Coumadin 10mg  po x1 this evening and monitor INR for dose adjustments.  Wynona Neat, PharmD, BCPS  06/25/2014,12:29 AM

## 2014-06-25 NOTE — Progress Notes (Addendum)
PROGRESS NOTE    [name] [id number] DOB: 03-Aug-1934 DOA: 06/24/2014 PCP: Blanchie Serve, MD  HPI/Brief narrative 80 y.o. female morbidly obese, with hx of sleep apnea CPAP non compliant, COPD, known CAD s/p 2 stent placement several years ago, hx of chronic diastolic CHF, hx of DM/IDDM on high doses of insulin, HTN, HLD, lower extremity DVT on Coumadin, presents to the ER with increase DOE for the past 2 weeks. She has no orthopnea or PND, and denied exertional CP. There has been no coughs, fever, chills, but she has audible wheezing. Evaluation in the ER showed EKG with NSR and no acute ST T changes, negative initial troponin, and negative D Dimer. Her INR was 1.92, BNP of only 62, and her CXR showed only minimal vascular congestion. Hospitalist was asked to admit her for further evaluation, with concern for cardiac ischemia. She gives a long history of intermittent left arm cramping associated with substernal cramping-without without exertion, relieved by sublingual NTG. She apparently gets these episodes once every couple of months. Stress test 5 years ago apparently negative. States they were unable to do cardiac cath secondary to renal insufficiency. Gives history of productive cough 3 weeks ago-improved after OTC medications. Cardiology consulted.  Assessment/Plan:  1. Dyspnea on exertion: Probably multifactorial related to morbid obesity, OSA/possible OHS, basal atelectasis, ? Acute on chronic diastolic CHF and residual acute bronchitis. Chest x-ray: Bibasal atelectasis without acute findings. Troponin 2 negative. D-dimer negative.advised compliance with nightly Cipro. Will need weight loss. Cardiology consulted for possible angina equivalent. No personal history of smoking or passive inhalation: Not sure if she truly has COPD. 2. Intermittent chest and left upper extremity discomfort:? Angina. The patient has several risk factors for CAD. Cardiology consulted. No chest pain  currently. Troponin 2 negative. 3. Possible mild acute on chronic diastolic CHF: oral Lasix increased. Follow-up 2-D echo. 4. OSA: Noncompliant with C Pap at home due to discomfort. C Pap daily at bedtime in hospital. 5. Uncontrolled DM 2 with renal complications: Continue home dose of long-acting insulin and reduced dose of lispro. Add sensitive SSI without bedtime NovoLog. Check A1c. 6. Essential hypertension: Continue metoprolol and nitrates. 7. History of CAD status post stents: Continue metoprolol and nitrates. Add aspirin. Cardiology consulted for evaluation. 8. History of dyslipidemia: Continue statins 9. Anemia: Likely related to chronic kidney disease. Follow CBC's. Stable. 10. Stage III chronic kidney disease: Creatinine may be close to baseline. Follow BMP in a.m.  11. History of lower extremity DVT: Last episode 2 years ago. Continue Coumadin per pharmacy. INR subtherapeutic at 1.7    Code Status: Full Family Communication: None at bedside Disposition Plan: Home when medically stable.   Consultants:  Cardiology  Procedures:  None  Antibiotics:  None   Subjective: Currently denies dyspnea or chest pain. Seen in ED.  Objective: Filed Vitals:   06/25/14 0530 06/25/14 0700 06/25/14 0714 06/25/14 0718  BP: 133/52 93/63  117/70  Pulse:  87 90 77  Temp:      TempSrc:      Resp: 23 17 17 16   SpO2:  99% 99% 100%    Intake/Output Summary (Last 24 hours) at 06/25/14 0848 Last data filed at 06/24/14 2349  Gross per 24 hour  Intake      0 ml  Output    300 ml  Net   -300 ml   There were no vitals filed for this visit.   Exam:  General exam: Moderately built and morbidly obese female patient  lying comfortably supine in bed  Respiratory system: Diminished breath sounds in the bases with occasional basal crackles but otherwise clear to auscultation . No increased work of breathing. Cardiovascular system: S1 & S2 heard, RRR. No JVD, murmurs, gallops, clicks or  pedal edema. Gastrointestinal system: Abdomen is nondistended, soft and nontender. Normal bowel sounds heard. Central nervous system: Alert and oriented. No focal neurological deficits. Extremities: Symmetric 5 x 5 power.   Data Reviewed: Basic Metabolic Panel:  Recent Labs Lab 06/24/14 1821  NA 134*  K 5.1  CL 101  CO2 25  GLUCOSE 272*  BUN 23  CREATININE 1.61*  CALCIUM 8.6   Liver Function Tests: No results for input(s): AST, ALT, ALKPHOS, BILITOT, PROT, ALBUMIN in the last 168 hours. No results for input(s): LIPASE, AMYLASE in the last 168 hours. No results for input(s): AMMONIA in the last 168 hours. CBC:  Recent Labs Lab 06/24/14 1821  WBC 11.0*  HGB 10.6*  HCT 33.7*  MCV 87.1  PLT 280   Cardiac Enzymes:  Recent Labs Lab 06/25/14 0155 06/25/14 0748  TROPONINI <0.03 <0.03   BNP (last 3 results)  Recent Labs  10/14/13 1644  PROBNP 150.2   CBG:  Recent Labs Lab 06/25/14 0159 06/25/14 0310 06/25/14 0446  GLUCAP 279* 243* 187*    No results found for this or any previous visit (from the past 240 hour(s)).         Studies: Dg Chest 2 View  06/24/2014   CLINICAL DATA:  Shortness of breath for 2 weeks, LEFT arm pain radiating to LEFT chest, history coronary artery disease post MI and coronary stenting, hypertension, type 2 diabetes, CHF, hyperlipidemia, COPD  EXAM: CHEST  2 VIEW  COMPARISON:  04/21/2014  FINDINGS: Upper normal heart size.  Normal mediastinal contours.  Atherosclerotic calcification at aortic arch.  Minimal vascular congestion.  Decreased lung volumes since previous exam with mild bibasilar atelectasis.  Upper lungs clear.  No pleural effusion or pneumothorax.  Scattered endplate spur formation thoracic spine with generalized osseous demineralization.  IMPRESSION: Bibasilar atelectasis.   Electronically Signed   By: Lavonia [name] M.D.   On: 06/24/2014 19:35        Scheduled Meds: . albuterol  2.5 mg Nebulization Q6H  .  colchicine  0.6 mg Oral Daily  . DULoxetine  60 mg Oral Daily  . furosemide  20 mg Oral BID  . insulin aspart  0-20 Units Subcutaneous 6 times per day  . insulin glargine  120 Units Subcutaneous Daily  . Insulin Lispro (Human)  25 Units Subcutaneous BID WC  . isosorbide mononitrate  60 mg Oral Daily  . Linaclotide  145 mcg Oral Daily  . metoprolol succinate  25 mg Oral Daily  . pantoprazole  40 mg Oral Daily  . pravastatin  20 mg Oral q1800  . sodium chloride  3 mL Intravenous Q12H  . warfarin  10 mg Oral ONCE-1800  . Warfarin - Pharmacist Dosing Inpatient   Does not apply q1800   Continuous Infusions:   Principal Problem:   DOE (dyspnea on exertion) Active Problems:   Hyperlipidemia   Essential hypertension   CAD (coronary artery disease)   Pulmonary embolus   Chronic diastolic congestive heart failure   DM type 2 causing CKD stage 3   Long term current use of anticoagulant therapy   Debility   OSA (obstructive sleep apnea)   COPD (chronic obstructive pulmonary disease)    Time spent: 25 minutes.    Jannat Rosemeyer,  MD, FACP, FHM. [facility] Pager [phone]  If 7PM-7AM, please contact night-coverage [email or web address] Password [facility] 06/25/2014, 8:48 AM    LOS: 0 days

## 2014-06-25 NOTE — ED Notes (Signed)
Nuclear medicine called, pt requesting pain medication before she gets on table for procedure.  Pt c/o generalized pain.  To medicate pt at radiology.

## 2014-06-26 ENCOUNTER — Inpatient Hospital Stay (HOSPITAL_COMMUNITY): Payer: Medicare PPO

## 2014-06-26 DIAGNOSIS — I5033 Acute on chronic diastolic (congestive) heart failure: Principal | ICD-10-CM

## 2014-06-26 DIAGNOSIS — R9439 Abnormal result of other cardiovascular function study: Secondary | ICD-10-CM

## 2014-06-26 DIAGNOSIS — R079 Chest pain, unspecified: Secondary | ICD-10-CM

## 2014-06-26 LAB — BASIC METABOLIC PANEL
Anion gap: 4 — ABNORMAL LOW (ref 5–15)
BUN: 21 mg/dL (ref 6–23)
CO2: 30 mmol/L (ref 19–32)
Calcium: 8.2 mg/dL — ABNORMAL LOW (ref 8.4–10.5)
Chloride: 103 mmol/L (ref 96–112)
Creatinine, Ser: 1.5 mg/dL — ABNORMAL HIGH (ref 0.50–1.10)
GFR calc Af Amer: 38 mL/min — ABNORMAL LOW (ref >90)
GFR calc non Af Amer: 32 mL/min — ABNORMAL LOW (ref >90)
Glucose, Bld: 210 mg/dL — ABNORMAL HIGH (ref 70–99)
Potassium: 4.3 mmol/L (ref 3.5–5.1)
Sodium: 137 mmol/L (ref 135–145)

## 2014-06-26 LAB — CBC
HCT: 30.5 % — ABNORMAL LOW (ref 36.0–46.0)
Hemoglobin: 9.5 g/dL — ABNORMAL LOW (ref 12.0–15.0)
MCH: 27.1 pg (ref 26.0–34.0)
MCHC: 31.1 g/dL (ref 30.0–36.0)
MCV: 87.1 fL (ref 78.0–100.0)
Platelets: 274 10*3/uL (ref 150–400)
RBC: 3.5 MIL/uL — ABNORMAL LOW (ref 3.87–5.11)
RDW: 15.4 % (ref 11.5–15.5)
WBC: 10.2 10*3/uL (ref 4.0–10.5)

## 2014-06-26 LAB — PROTIME-INR
INR: 1.66 — ABNORMAL HIGH (ref 0.00–1.49)
Prothrombin Time: 19.8 seconds — ABNORMAL HIGH (ref 11.6–15.2)

## 2014-06-26 LAB — HEMOGLOBIN A1C
Hgb A1c MFr Bld: 9.9 % — ABNORMAL HIGH (ref 4.8–5.6)
Mean Plasma Glucose: 237 mg/dL

## 2014-06-26 LAB — GLUCOSE, CAPILLARY
Glucose-Capillary: 200 mg/dL — ABNORMAL HIGH (ref 70–99)
Glucose-Capillary: 241 mg/dL — ABNORMAL HIGH (ref 70–99)
Glucose-Capillary: 242 mg/dL — ABNORMAL HIGH (ref 70–99)
Glucose-Capillary: 162 mg/dL — ABNORMAL HIGH (ref 70–99)
Glucose-Capillary: 418 mg/dL — ABNORMAL HIGH (ref 70–99)

## 2014-06-26 MED ORDER — REGADENOSON 0.4 MG/5ML IV SOLN
INTRAVENOUS | Status: AC
  Administered 2014-06-26: 0.4 mg via INTRAVENOUS
  Filled 2014-06-26: qty 5

## 2014-06-26 MED ORDER — INSULIN ASPART 100 UNIT/ML ~~LOC~~ SOLN
35.0000 [IU] | Freq: Two times a day (BID) | SUBCUTANEOUS | Status: DC
  Administered 2014-06-26 – 2014-06-29 (×6): 35 [IU] via SUBCUTANEOUS

## 2014-06-26 MED ORDER — REGADENOSON 0.4 MG/5ML IV SOLN
0.4000 mg | Freq: Once | INTRAVENOUS | Status: AC
  Filled 2014-06-26: qty 5

## 2014-06-26 MED ORDER — TECHNETIUM TC 99M SESTAMIBI GENERIC - CARDIOLITE
30.0000 | Freq: Once | INTRAVENOUS | Status: AC | PRN
  Administered 2014-06-25: 30 via INTRAVENOUS

## 2014-06-26 MED ORDER — TECHNETIUM TC 99M SESTAMIBI GENERIC - CARDIOLITE
30.0000 | Freq: Once | INTRAVENOUS | Status: AC | PRN
  Administered 2014-06-26: 30 via INTRAVENOUS

## 2014-06-26 MED ORDER — REGADENOSON 0.4 MG/5ML IV SOLN
0.4000 mg | Freq: Once | INTRAVENOUS | Status: AC
  Administered 2014-06-26: 0.4 mg via INTRAVENOUS
  Filled 2014-06-26: qty 5

## 2014-06-26 MED ORDER — WARFARIN SODIUM 10 MG PO TABS
10.0000 mg | ORAL_TABLET | Freq: Once | ORAL | Status: AC
  Administered 2014-06-26: 10 mg via ORAL
  Filled 2014-06-26: qty 1

## 2014-06-26 MED ORDER — HEPARIN BOLUS VIA INFUSION
4000.0000 [IU] | Freq: Once | INTRAVENOUS | Status: AC
  Administered 2014-06-26: 4000 [IU] via INTRAVENOUS
  Filled 2014-06-26: qty 4000

## 2014-06-26 MED ORDER — HEPARIN (PORCINE) IN NACL 100-0.45 UNIT/ML-% IJ SOLN
2100.0000 [IU]/h | INTRAMUSCULAR | Status: DC
  Administered 2014-06-26: 1400 [IU]/h via INTRAVENOUS
  Administered 2014-06-28: 2100 [IU]/h via INTRAVENOUS
  Filled 2014-06-26 (×4): qty 250

## 2014-06-26 NOTE — Progress Notes (Signed)
Utilization review completed.  

## 2014-06-26 NOTE — Progress Notes (Signed)
Addendum  Stress test results reviewed and abnormal as below  IMPRESSION: 1. Moderate inferior ischemia and mild anteroapical ischemia.  2. Inferior wall hypokinesis.  3. Left ventricular ejection fraction 51%  4. High-risk stress test findings*. Suggestive of multivessel CAD.  Await Cardiology follow up and recommendations.  Vernell Leep, MD, FACP, FHM. [facility] Pager [phone]  If 7PM-7AM, please contact night-coverage [email or web address] Password [email or web address] 06/26/2014, 5:50 PM

## 2014-06-26 NOTE — Progress Notes (Signed)
MD notified of cbg >400. Pt had just eaten a late lunch, plus her family had brought her an additional meal. MD instructed to give 35 scheduled units of Novolog plus 9 units of sliding scale novolog: 44 units of novolog total.  Will continue to monitor.

## 2014-06-26 NOTE — Progress Notes (Signed)
PROGRESS NOTE    [name] [id number] DOB: [birth year] DOA: 06/24/2014 PCP: Blanchie Serve, MD  HPI/Brief narrative 80 y.o. female morbidly obese, with hx of sleep apnea CPAP non compliant, COPD, known CAD s/p 2 stent placement several years ago, hx of chronic diastolic CHF, hx of DM/IDDM on high doses of insulin, HTN, HLD, lower extremity DVT on Coumadin, presents to the ER with increase DOE for the past 2 weeks. She has no orthopnea or PND, and denied exertional CP. There has been no coughs, fever, chills, but she has audible wheezing. Evaluation in the ER showed EKG with NSR and no acute ST T changes, negative initial troponin, and negative D Dimer. Her INR was 1.92, BNP of only 62, and her CXR showed only minimal vascular congestion. Hospitalist was asked to admit her for further evaluation, with concern for cardiac ischemia. She gives a long history of intermittent left arm cramping associated with substernal cramping-without without exertion, relieved by sublingual NTG. She apparently gets these episodes once every couple of months. Stress test 5 years ago apparently negative. States they were unable to do cardiac cath secondary to renal insufficiency. Gives history of productive cough 3 weeks ago-improved after OTC medications. Cardiology consulted.  Assessment/Plan:  1. Dyspnea on exertion: Probably multifactorial related to morbid obesity, OSA/possible OHS, basal atelectasis, ? Acute on chronic diastolic CHF and residual acute bronchitis. Chest x-ray: Bibasal atelectasis without acute findings. Troponin 3 negative. D-dimer negative. Advised compliance with nightly CPAP. Will need weight loss. Cardiology input appreciated. 2-D echo: LVEF 60-65% and grade 1 diastolic dysfunction. No personal history of smoking or passive inhalation: Not sure if she truly has COPD. 2. Intermittent chest and left upper extremity discomfort:? Angina. The patient has several risk factors for CAD.  Cardiology input appreciated. No chest pain since admission. Troponin 3 negative. Follow-up day 2 of 2 stress test and cardiology recommendations. 3. Possible mild acute on chronic diastolic CHF: oral Lasix increased. Echo results as above. 4. OSA: Noncompliant with C Pap at home due to discomfort. C Pap daily at bedtime in hospital. Outpatient follow-up with pulmonology. 5. Uncontrolled DM 2 with renal complications: Continue home dose of long-acting insulin and reduced dose of lispro. Added sensitive SSI without bedtime NovoLog. CBGs in the 200s. Check A1c: 9.9 suggesting poor outpatient control. Outpatient follow-up with PCP re the management 6. Essential hypertension: Continue metoprolol and nitrates. 7. History of CAD status post stents: Continue metoprolol and nitrates. Add aspirin. Management per cardiology.. 8. History of dyslipidemia: Continue statins 9. Anemia: Likely related to chronic kidney disease. Stable. 10. Stage III chronic kidney disease: Creatinine may be close to baseline.  11. History of lower extremity DVT: Last episode 2 years ago. Continue Coumadin per pharmacy. INR subtherapeutic at 1.66    Code Status: Full Family Communication: None at bedside Disposition Plan: DC home pending workup and cardiology clearance.   Consultants:  Cardiology  Procedures:  None  Antibiotics:  None   Subjective: Currently denies dyspnea or chest pain.   Objective: Filed Vitals:   06/25/14 1300 06/25/14 1348 06/25/14 1517 06/25/14 1958  BP: 128/52 128/47  129/47  Pulse: 80 81  78  Temp:  98.4 F (36.9 C)  98.7 F (37.1 C)  TempSrc:  Oral  Oral  Resp:  18  18  Height:  5\' 7"  (1.702 m)    Weight:  168.284 kg (371 lb)    SpO2: 97% 98% 98% 97%    Intake/Output Summary (Last 24 hours) at  06/26/14 1045 Last data filed at 06/26/14 0837  Gross per 24 hour  Intake    240 ml  Output   1200 ml  Net   -960 ml   Filed Weights   06/25/14 1348  Weight: 168.284 kg (371 lb)      Exam:  General exam: Moderately built and morbidly obese female patient lying comfortably supine in bed  Respiratory system: clear to auscultation . No increased work of breathing. Cardiovascular system: S1 & S2 heard, RRR. No JVD, murmurs, gallops, clicks or pedal edema. Telemetry: Sinus rhythm. Gastrointestinal system: Abdomen is nondistended, soft and nontender. Normal bowel sounds heard. Central nervous system: Alert and oriented. No focal neurological deficits. Extremities: Symmetric 5 x 5 power.   Data Reviewed: Basic Metabolic Panel:  Recent Labs Lab 06/24/14 1821 06/26/14 0605  NA 134* 137  K 5.1 4.3  CL 101 103  CO2 25 30  GLUCOSE 272* 210*  BUN 23 21  CREATININE 1.61* 1.50*  CALCIUM 8.6 8.2*   Liver Function Tests: No results for input(s): AST, ALT, ALKPHOS, BILITOT, PROT, ALBUMIN in the last 168 hours. No results for input(s): LIPASE, AMYLASE in the last 168 hours. No results for input(s): AMMONIA in the last 168 hours. CBC:  Recent Labs Lab 06/24/14 1821 06/26/14 0605  WBC 11.0* 10.2  HGB 10.6* 9.5*  HCT 33.7* 30.5*  MCV 87.1 87.1  PLT 280 274   Cardiac Enzymes:  Recent Labs Lab 06/25/14 0155 06/25/14 0748 06/25/14 1410  TROPONINI <0.03 <0.03 <0.03   BNP (last 3 results)  Recent Labs  10/14/13 1644  PROBNP 150.2   CBG:  Recent Labs Lab 06/25/14 1426 06/25/14 1609 06/25/14 2005 06/26/14 0001 06/26/14 0509  GLUCAP 293* 335* 241* 242* 200*    No results found for this or any previous visit (from the past 240 hour(s)).         Studies: Dg Chest 2 View  06/24/2014   CLINICAL DATA:  Shortness of breath for 2 weeks, LEFT arm pain radiating to LEFT chest, history coronary artery disease post MI and coronary stenting, hypertension, type 2 diabetes, CHF, hyperlipidemia, COPD  EXAM: CHEST  2 VIEW  COMPARISON:  04/21/2014  FINDINGS: Upper normal heart size.  Normal mediastinal contours.  Atherosclerotic calcification at aortic  arch.  Minimal vascular congestion.  Decreased lung volumes since previous exam with mild bibasilar atelectasis.  Upper lungs clear.  No pleural effusion or pneumothorax.  Scattered endplate spur formation thoracic spine with generalized osseous demineralization.  IMPRESSION: Bibasilar atelectasis.   Electronically Signed   By: Lavonia [name] M.D.   On: 06/24/2014 19:35        Scheduled Meds: . aspirin EC  81 mg Oral Daily  . colchicine  0.6 mg Oral Daily  . DULoxetine  60 mg Oral Daily  . furosemide  40 mg Oral BID  . insulin aspart  0-9 Units Subcutaneous TID WC  . insulin aspart  25 Units Subcutaneous BID WC  . insulin glargine  120 Units Subcutaneous QHS  . isosorbide mononitrate  60 mg Oral Daily  . Linaclotide  145 mcg Oral Daily  . metoprolol succinate  25 mg Oral Daily  . pantoprazole  40 mg Oral Daily  . pravastatin  20 mg Oral q1800  . regadenoson  0.4 mg Intravenous Once  . sodium chloride  3 mL Intravenous Q12H  . Warfarin - Pharmacist Dosing Inpatient   Does not apply q1800   Continuous Infusions:   Principal Problem:  DOE (dyspnea on exertion) Active Problems:   Hyperlipidemia   Essential hypertension   CAD (coronary artery disease)   Pulmonary embolus   Chronic diastolic congestive heart failure   DM type 2 causing CKD stage 3   Long term current use of anticoagulant therapy   Debility   OSA (obstructive sleep apnea)   COPD (chronic obstructive pulmonary disease)    Time spent: 25 minutes.    Vernell Leep, MD, FACP, FHM. [facility] Pager [phone]  If 7PM-7AM, please contact night-coverage [email or web address] Password [email or web address] 06/26/2014, 10:45 AM    LOS: 1 day

## 2014-06-26 NOTE — Progress Notes (Signed)
Patient Name: [name] Date of Encounter: 06/26/2014   Principal Problem:   DOE (dyspnea on exertion) Active Problems:   Hyperlipidemia   Essential hypertension   CAD (coronary artery disease)   Pulmonary embolus   Chronic diastolic congestive heart failure   DM type 2 causing CKD stage 3   Long term current use of anticoagulant therapy   Debility   OSA (obstructive sleep apnea)   COPD (chronic obstructive pulmonary disease)    SUBJECTIVE  No chest pain or sob.  Cardiolite performed earlier today showed: Moderate inferior ischemia and mild anteroapical ischemia, Inferior wall hypokinesis, and Left ventricular ejection fraction 51%.  CURRENT MEDS . aspirin EC  81 mg Oral Daily  . colchicine  0.6 mg Oral Daily  . DULoxetine  60 mg Oral Daily  . furosemide  40 mg Oral BID  . insulin aspart  0-9 Units Subcutaneous TID WC  . insulin aspart  35 Units Subcutaneous BID WC  . insulin glargine  120 Units Subcutaneous QHS  . isosorbide mononitrate  60 mg Oral Daily  . Linaclotide  145 mcg Oral Daily  . metoprolol succinate  25 mg Oral Daily  . pantoprazole  40 mg Oral Daily  . pravastatin  20 mg Oral q1800  . regadenoson  0.4 mg Intravenous Once  . sodium chloride  3 mL Intravenous Q12H  . Warfarin - Pharmacist Dosing Inpatient   Does not apply q1800    OBJECTIVE  Filed Vitals:   06/26/14 1343 06/26/14 1349 06/26/14 1516 06/26/14 1542  BP: 139/57 134/56 148/73   Pulse: 102 98 97   Temp:    97.5 F (36.4 C)  TempSrc:    Oral  Resp:    19  Height:      Weight:      SpO2:    100%    Intake/Output Summary (Last 24 hours) at 06/26/14 1804 Last data filed at 06/26/14 1740  Gross per 24 hour  Intake    220 ml  Output   1200 ml  Net   -980 ml   Filed Weights   06/25/14 1348  Weight: 371 lb (168.284 kg)    PHYSICAL EXAM  General: Pleasant, NAD. Neuro: Alert and oriented X 3. Moves all extremities spontaneously. Psych: Normal affect. HEENT:  Normal  Neck:  Supple without bruits or JVD. Lungs:  Resp regular and unlabored, CTA. Heart: RRR no s3, s4, or murmurs. Abdomen: Soft, non-tender, non-distended, BS + x 4.  Extremities: No clubbing, cyanosis or edema. DP/PT/Radials 2+ and equal bilaterally.  Accessory Clinical Findings  CBC  Recent Labs  06/24/14 1821 06/26/14 0605  WBC 11.0* 10.2  HGB 10.6* 9.5*  HCT 33.7* 30.5*  MCV 87.1 87.1  PLT 280 [id number]   Basic Metabolic Panel  Recent Labs  06/24/14 1821 06/26/14 0605  NA 134* 137  K 5.1 4.3  CL 101 103  CO2 25 30  GLUCOSE 272* 210*  BUN 23 21  CREATININE 1.61* 1.50*  CALCIUM 8.6 8.2*   Cardiac Enzymes  Recent Labs  06/25/14 0155 06/25/14 0748 06/25/14 1410  TROPONINI <0.03 <0.03 <0.03   D-Dimer  Recent Labs  06/24/14 1821  DDIMER 0.30   Hemoglobin A1C  Recent Labs  06/25/14 0155  HGBA1C 9.9*   Thyroid Function Tests  Recent Labs  06/25/14 0155  TSH 0.733   Lab Results  Component Value Date   INR 1.66* 06/26/2014   INR 1.74* 06/25/2014   INR 1.92* 06/24/2014  TELE  rsr  Radiology/Studies  Dg Chest 2 View  06/24/2014   CLINICAL DATA:  Shortness of breath for 2 weeks, LEFT arm pain radiating to LEFT chest, history coronary artery disease post MI and coronary stenting, hypertension, type 2 diabetes, CHF, hyperlipidemia, COPD  EXAM: CHEST  2 VIEW  COMPARISON:  04/21/2014  FINDINGS: Upper normal heart size.  Normal mediastinal contours.  Atherosclerotic calcification at aortic arch.  Minimal vascular congestion.  Decreased lung volumes since previous exam with mild bibasilar atelectasis.  Upper lungs clear.  No pleural effusion or pneumothorax.  Scattered endplate spur formation thoracic spine with generalized osseous demineralization.  IMPRESSION: Bibasilar atelectasis.   Electronically Signed   By: Lavonia [name] M.D.   On: 06/24/2014 19:35   Nm Myocar Multi W/spect W/wall Motion / Ef  06/26/2014   CLINICAL DATA:  This is a 80 y.o. female with a past  medical history significant for CAD with past inferior MI in 2004. She has had bare-metal stent to the RCA in 2004 and drug-eluting stent placement to the RCA in 06/2010 in the setting of unstable angina with overall preserved LV function. Last cath in 2012 showing otherwise nonobstructive disease. She presents with dyspnea and new ECG changes  EXAM: MYOCARDIAL IMAGING WITH SPECT (REST AND PHARMACOLOGIC-STRESS - 2 DAY PROTOCOL)  GATED LEFT VENTRICULAR WALL MOTION STUDY  LEFT VENTRICULAR EJECTION FRACTION  IMPRESSION: 1.  Moderate inferior ischemia and mild anteroapical ischemia.  2.  Inferior wall hypokinesis.  3. Left ventricular ejection fraction 51%  4. High-risk stress test findings*.  Suggestive of multivessel CAD.  *2012 Appropriate Use Criteria for Coronary Revascularization Focused Update: J Am Coll Cardiol. [id number]. http://content.airportbarriers.com.aspx?articleid=1201161   Electronically Signed   By: Sanda Klein   On: 06/26/2014 16:57    ASSESSMENT AND PLAN  1.  Exertional dyspnea/CAD:  R/o for MI.  Prior h/o RCA stenting in 2012, presenting with progressive DOE.  Cardioilte today was high risk with evidence of inferior and anteroapical ischemia.  NL LV.  Cont asa, statin, bb, nitrate.  Hold lasix over the weekend and hydrate on Sunday.  Plan cath on Monday.  The patient understands that risks include but are not limited to stroke (1 in 1000), death (1 in 64), kidney failure [usually temporary] (1 in 500), bleeding (1 in 200), allergic reaction [possibly serious] (1 in 200), and agrees to proceed.    2.  HTN:  Stable on bb/nitrate.  3.  HL:  Cont statin.  4.  DM:  Per IM.  5.  CKD III:  Follow creat over the weekend.  Hold lasix.  6.  H/O PE on chronic coumadin:  INR 1.66 today.  Hold coumadin - bridge with heparin.  7.  Chronic diastolic chf:  Volume difficult to judge but appears to be stable.  Signed, Murray Hodgkins NP  Personally seen and examined. Agree  with above. Described cath with her. Radial approach, risks. Willing to proceed.  Creat 1.5. Hold Lasix. Hydrate Sunday.  Lungs clear. 2+ Radial pulse.   Candee Furbish, MD

## 2014-06-26 NOTE — Progress Notes (Addendum)
ANTICOAGULATION CONSULT NOTE - Follow Up Consult  Pharmacy Consult for Coumadin and Heparin Indication: DVT  Allergies  Allergen Reactions  . Sulfonamide Derivatives Swelling    To mouth    Patient Measurements: Height: 5\' 7"  (170.2 cm) Weight: (!) 371 lb (168.284 kg) IBW/kg (Calculated) : 61.6 Heparin Dosing Weight: 104 kg  Vital Signs:    Labs:  Recent Labs  06/24/14 1821 06/25/14 0155 06/25/14 0748 06/25/14 1410 06/26/14 0605  HGB 10.6*  --   --   --  9.5*  HCT 33.7*  --   --   --  30.5*  PLT 280  --   --   --  274  APTT 39*  --   --   --   --   LABPROT 22.1*  --  20.5*  --  19.8*  INR 1.92*  --  1.74*  --  1.66*  CREATININE 1.61*  --   --   --  1.50*  TROPONINI  --  <0.03 <0.03 <0.03  --     Estimated Creatinine Clearance: 51.7 mL/min (by C-G formula based on Cr of 1.5).   Assessment: 80 yo female on chronic Coumadin for hx DVT.  INR is subtherapeutic today despite receiving Coumadin dose last night larger than home dose.  No bleeding or complications noted.  PTA Coumadin dose 5 mg daily except 7.5 mg on TTSat  Goal of Therapy:  INR 2-3 Monitor platelets by anticoagulation protocol: Yes   Plan:  1. Coumadin 10 mg x 1 tonight. 2. Daily PT/INR.  Uvaldo Rising, BCPS  Clinical Pharmacist Pager [phone]  06/26/2014 12:53 PM   ADDN: Pharmacy is consulted to dose heparin for history of PE to bridge patient while awaiting cath on Monday. Hgb 9.5, Plt 274  Plan: Heparin 4000 units bolus then Heparin 1400 units/hr 8h HL Daily HL/CBC Monitor s/sx of bleeding  Andrey Cota. Diona Foley, PharmD Clinical Pharmacist Pager [phone]

## 2014-06-26 NOTE — Progress Notes (Signed)
Patient refuses CPAP at this time. She is aware to call the RT if she wants it later. RT will continue to monitor

## 2014-06-27 DIAGNOSIS — R9439 Abnormal result of other cardiovascular function study: Secondary | ICD-10-CM

## 2014-06-27 DIAGNOSIS — I2510 Atherosclerotic heart disease of native coronary artery without angina pectoris: Secondary | ICD-10-CM

## 2014-06-27 LAB — PROTIME-INR
INR: 1.84 — ABNORMAL HIGH (ref 0.00–1.49)
Prothrombin Time: 21.5 seconds — ABNORMAL HIGH (ref 11.6–15.2)

## 2014-06-27 LAB — BASIC METABOLIC PANEL
Anion gap: 12 (ref 5–15)
BUN: 17 mg/dL (ref 6–23)
CO2: 25 mmol/L (ref 19–32)
Calcium: 8.8 mg/dL (ref 8.4–10.5)
Chloride: 100 mmol/L (ref 96–112)
Creatinine, Ser: 1.44 mg/dL — ABNORMAL HIGH (ref 0.50–1.10)
GFR calc Af Amer: 39 mL/min — ABNORMAL LOW (ref >90)
GFR calc non Af Amer: 34 mL/min — ABNORMAL LOW (ref >90)
Glucose, Bld: 151 mg/dL — ABNORMAL HIGH (ref 70–99)
Potassium: 4.3 mmol/L (ref 3.5–5.1)
Sodium: 137 mmol/L (ref 135–145)

## 2014-06-27 LAB — CBC
HCT: 29.6 % — ABNORMAL LOW (ref 36.0–46.0)
Hemoglobin: 9.2 g/dL — ABNORMAL LOW (ref 12.0–15.0)
MCH: 27 pg (ref 26.0–34.0)
MCHC: 31.1 g/dL (ref 30.0–36.0)
MCV: 86.8 fL (ref 78.0–100.0)
Platelets: 263 10*3/uL (ref 150–400)
RBC: 3.41 MIL/uL — ABNORMAL LOW (ref 3.87–5.11)
RDW: 15.4 % (ref 11.5–15.5)
WBC: 10.6 10*3/uL — ABNORMAL HIGH (ref 4.0–10.5)

## 2014-06-27 LAB — HEPARIN LEVEL (UNFRACTIONATED)
Heparin Unfractionated: 0.23 IU/mL — ABNORMAL LOW (ref 0.30–0.70)
Heparin Unfractionated: 0.17 IU/mL — ABNORMAL LOW (ref 0.30–0.70)
Heparin Unfractionated: 0.23 IU/mL — ABNORMAL LOW (ref 0.30–0.70)

## 2014-06-27 LAB — GLUCOSE, CAPILLARY
Glucose-Capillary: 133 mg/dL — ABNORMAL HIGH (ref 70–99)
Glucose-Capillary: 133 mg/dL — ABNORMAL HIGH (ref 70–99)
Glucose-Capillary: 140 mg/dL — ABNORMAL HIGH (ref 70–99)
Glucose-Capillary: 169 mg/dL — ABNORMAL HIGH (ref 70–99)
Glucose-Capillary: 268 mg/dL — ABNORMAL HIGH (ref 70–99)
Glucose-Capillary: 189 mg/dL — ABNORMAL HIGH (ref 70–99)

## 2014-06-27 MED ORDER — WARFARIN SODIUM 10 MG PO TABS
10.0000 mg | ORAL_TABLET | Freq: Once | ORAL | Status: DC
  Filled 2014-06-27: qty 1

## 2014-06-27 MED ORDER — WARFARIN - PHARMACIST DOSING INPATIENT: Freq: Every day | Status: DC

## 2014-06-27 MED ORDER — DIPHENHYDRAMINE HCL 25 MG PO CAPS
25.0000 mg | ORAL_CAPSULE | Freq: Once | ORAL | Status: AC
Start: 2014-06-27 — End: 2014-06-27
  Administered 2014-06-27: 25 mg via ORAL
  Filled 2014-06-27: qty 1

## 2014-06-27 MED ORDER — SODIUM CHLORIDE 0.9 % IV SOLN
INTRAVENOUS | Status: DC
  Administered 2014-06-28 (×2): via INTRAVENOUS

## 2014-06-27 NOTE — Progress Notes (Signed)
ANTICOAGULATION CONSULT NOTE - Follow Up Consult  Pharmacy Consult for Heparin Indication: DVT  Allergies  Allergen Reactions  . Sulfonamide Derivatives Swelling    To mouth    Patient Measurements: Height: 5\' 7"  (170.2 cm) Weight: (!) 371 lb (168.284 kg) IBW/kg (Calculated) : 61.6 Heparin Dosing Weight: 104 kg  Vital Signs: Temp: 98 F (36.7 C) (02/20 0318) Temp Source: Oral (02/20 0318) BP: 135/56 mmHg (02/20 0318) Pulse Rate: 80 (02/20 0318)  Labs:  Recent Labs  06/24/14 1821 06/25/14 0155 06/25/14 0748 06/25/14 1410 06/26/14 0605 06/27/14 0440  HGB 10.6*  --   --   --  9.5* 9.2*  HCT 33.7*  --   --   --  30.5* 29.6*  PLT 280  --   --   --  274 263  APTT 39*  --   --   --   --   --   LABPROT 22.1*  --  20.5*  --  19.8* 21.5*  INR 1.92*  --  1.74*  --  1.66* 1.84*  HEPARINUNFRC  --   --   --   --   --  0.23*  CREATININE 1.61*  --   --   --  1.50*  --   TROPONINI  --  <0.03 <0.03 <0.03  --   --     Estimated Creatinine Clearance: 51.7 mL/min (by C-G formula based on Cr of 1.5).   Assessment: 80 yo female on chronic Coumadin for hx DVT.  Plan was to hold coumadin and bridge with heparin but coumain was given yesterday.  INR 1.84 Heparin level is subtherapeutic  No bleeding or complications noted.  Goal of Therapy:  Heparin level 0.3-0.7 units/ml Monitor platelets by anticoagulation protocol: Yes   Plan:  Increase heparin 1600 units/hr 6h HL Daily HL/CBC Monitor s/sx of bleeding  Thanks for allowing pharmacy to be a part of this patient's care.  Excell Seltzer, PharmD Clinical Pharmacist, [phone]

## 2014-06-27 NOTE — Progress Notes (Signed)
ANTICOAGULATION CONSULT NOTE - Follow Up Consult  Pharmacy Consult for heparin Indication:hx  DVT  Allergies  Allergen Reactions  . Sulfonamide Derivatives Swelling    To mouth    Patient Measurements: Height: 5\' 7"  (170.2 cm) Weight: (!) 371 lb (168.284 kg) IBW/kg (Calculated) : 61.6 Heparin Dosing Weight: 104 kg  Vital Signs: Temp: 98 F (36.7 C) (02/20 0318) Temp Source: Oral (02/20 0318) BP: 142/72 mmHg (02/20 1153) Pulse Rate: 78 (02/20 1153)  Labs:  Recent Labs  06/24/14 1821 06/25/14 0155 06/25/14 0748 06/25/14 1410 06/26/14 0605 06/27/14 0440 06/27/14 1208  HGB 10.6*  --   --   --  9.5* 9.2*  --   HCT 33.7*  --   --   --  30.5* 29.6*  --   PLT 280  --   --   --  274 263  --   APTT 39*  --   --   --   --   --   --   LABPROT 22.1*  --  20.5*  --  19.8* 21.5*  --   INR 1.92*  --  1.74*  --  1.66* 1.84*  --   HEPARINUNFRC  --   --   --   --   --  0.23* 0.17*  CREATININE 1.61*  --   --   --  1.50*  --  1.44*  TROPONINI  --  <0.03 <0.03 <0.03  --   --   --     Estimated Creatinine Clearance: 53.9 mL/min (by C-G formula based on Cr of 1.44).  Assessment: Patient is a 80 y.o F on coumadin PTA for hx DVT with stress test suggestive of "multivessell CAD."  Coumadin currently on hold and bridging with heparin in anticipation of cardiac cath procedure on Monday 2/22.  Unfortunately, coumadin dose was given on 2/19 with INR increased to 1.84 today.  Heparin level is below goal at 0.17.  RN reported that heparin drip was off for ~10 minutes for replacing patient's IV line.  No bleeding noted.  Goal of Therapy:  Heparin level 0.3-0.7 units/ml Monitor platelets by anticoagulation protocol: Yes   Plan:  - increase heparin drip to 1900 units/hr - check 8 hour heparin level - hold coumadin for cath on 2/22  Mariacristina Aday P 06/27/2014,1:34 PM

## 2014-06-27 NOTE — Progress Notes (Signed)
ANTICOAGULATION CONSULT NOTE - Follow Up Consult  Pharmacy Consult for heparin Indication:hx  DVT  Allergies  Allergen Reactions  . Sulfonamide Derivatives Swelling    To mouth    Patient Measurements: Height: 5\' 7"  (170.2 cm) Weight: (!) 371 lb (168.284 kg) IBW/kg (Calculated) : 61.6 Heparin Dosing Weight: 104 kg  Vital Signs: Temp: 97.9 F (36.6 C) (02/20 2045) Temp Source: Oral (02/20 2045) BP: 151/65 mmHg (02/20 2045) Pulse Rate: 72 (02/20 2045)  Labs:  Recent Labs  06/25/14 0155 06/25/14 0748 06/25/14 1410 06/26/14 0605 06/27/14 0440 06/27/14 1208 06/27/14 2150  HGB  --   --   --  9.5* 9.2*  --   --   HCT  --   --   --  30.5* 29.6*  --   --   PLT  --   --   --  274 263  --   --   LABPROT  --  20.5*  --  19.8* 21.5*  --   --   INR  --  1.74*  --  1.66* 1.84*  --   --   HEPARINUNFRC  --   --   --   --  0.23* 0.17* 0.23*  CREATININE  --   --   --  1.50*  --  1.44*  --   TROPONINI <0.03 <0.03 <0.03  --   --   --   --     Estimated Creatinine Clearance: 53.9 mL/min (by C-G formula based on Cr of 1.44).  Assessment: Patient is a 80 y.o F on coumadin PTA for hx DVT with stress test suggestive of "multivessell CAD."  Coumadin currently on hold and bridging with heparin in anticipation of cardiac cath procedure on Monday 2/22.  Unfortunately, coumadin dose was given on 2/19 with INR increased to 1.84 today.   Heparin level remains low at 0.23 units/hr  Goal of Therapy:  Heparin level 0.3-0.7 units/ml Monitor platelets by anticoagulation protocol: Yes   Plan:  - increase heparin drip to 2100 units/hr - check 8 hour heparin level - hold coumadin for cath on 2/22  Excell Seltzer [place] 06/27/2014,11:06 PM

## 2014-06-27 NOTE — Progress Notes (Signed)
PROGRESS NOTE    [name] [id number] DOB: 05/08/1935 DOA: 06/24/2014 PCP: Blanchie Serve, MD  HPI/Brief narrative 80 y.o. female morbidly obese, with hx of sleep apnea CPAP non compliant, COPD, known CAD s/p 2 stent placement several years ago, hx of chronic diastolic CHF, hx of DM/IDDM on high doses of insulin, HTN, HLD, lower extremity DVT on Coumadin, presents to the ER with increase DOE for the past 2 weeks. She has no orthopnea or PND, and denied exertional CP. There has been no coughs, fever, chills, but she has audible wheezing. Evaluation in the ER showed EKG with NSR and no acute ST T changes, negative initial troponin, and negative D Dimer. Her INR was 1.92, BNP of only 62, and her CXR showed only minimal vascular congestion. Hospitalist was asked to admit her for further evaluation, with concern for cardiac ischemia. She gives a long history of intermittent left arm cramping associated with substernal cramping-without without exertion, relieved by sublingual NTG. She apparently gets these episodes once every couple of months. Stress test 5 years ago apparently negative. States they were unable to do cardiac cath secondary to renal insufficiency. Gives history of productive cough 3 weeks ago-improved after OTC medications. Cardiology consulted.  Assessment/Plan:   Dyspnea on exertion:  -Probably multifactorial related to morbid obesity, OSA/possible OHS, basal atelectasis, ? Acute on chronic diastolic CHF and residual acute bronchitis.  -Chest x-ray: Bibasal atelectasis without acute findings. Troponin 3 negative. D-dimer negative. Advised compliance with nightly CPAP.  -Cardiology input appreciated. 2-D echo: LVEF 60-65% and grade 1 diastolic dysfunction. No personal history of smoking or passive inhalation: Not sure if she truly has COPD.   Chest pain/History of CAD status post stents -? Angina. The patient has several risk factors for CAD. Cardiology input appreciated.   -No chest pain since admission.  -Troponin 3 negative.  - R/o for MI. Prior h/o RCA stenting in 2012, presenting with progressive DOE. Cardioilte 2/19 was high risk with evidence of inferior and anteroapical ischemia. NL LV. Cont asa, statin, bb, nitrate. Hold lasix over the weekend and hydrate on Sunday. Plan cath on Monday -: Continue metoprolol and nitrates. Add aspirin. Management per cardiology.  Possible mild acute on chronic diastolic CHF: - Hold Lasix as planned for cardiac cath on Monday.  OSA:  -Noncompliant with C Pap at home due to discomfort. C Pap daily at bedtime in hospital. Outpatient follow-up with pulmonology.  Uncontrolled DM 2 with renal complications: - Continue home dose of long-acting insulin and reduced dose of lispro. Added sensitive SSI without bedtime NovoLog. CBGs in the 150s. - Check A1c: 9.9 suggesting poor outpatient control. Outpatient follow-up with PCP re the management  Essential hypertension:  -Continue metoprolol and nitrates.  History of dyslipidemia:  -Continue statins  Anemia:  -Likely related to chronic kidney disease. Stable.  Stage III chronic kidney disease:  -Creatinine may be close to baseline.   History of lower extremity DVT:  -Last episode 2 years ago. Continue with heparin drip as on for cardiac cath on Monday.   Code Status: Full Family Communication: None at bedside Disposition Plan: Cardiac cath on Monday.   Consultants:  Cardiology  Procedures:  Nuclear stress test 2/19  Antibiotics:  None   Subjective: Currently denies dyspnea or chest pain.   Objective: Filed Vitals:   06/26/14 1516 06/26/14 1542 06/26/14 2044 06/27/14 0318  BP: 148/73  134/60 135/56  Pulse: 97  87 80  Temp:  97.5 F (36.4 C) 98.6 F (37  C) 98 F (36.7 C)  TempSrc:  Oral Oral Oral  Resp:  19 18 18   Height:      Weight:      SpO2:  100% 98% 98%    Intake/Output Summary (Last 24 hours) at 06/27/14 1116 Last data filed at  06/26/14 1740  Gross per 24 hour  Intake    220 ml  Output      0 ml  Net    220 ml   Filed Weights   06/25/14 1348  Weight: 168.284 kg (371 lb)     Exam:  General exam: Moderately built and morbidly obese female patient lying comfortably supine in bed  Respiratory system: clear to auscultation . No increased work of breathing. Cardiovascular system: S1 & S2 heard, RRR. No JVD, murmurs, gallops, clicks or pedal edema. Telemetry: Sinus rhythm. Gastrointestinal system: Abdomen is nondistended, soft and nontender. Normal bowel sounds heard. Central nervous system: Alert and oriented. No focal neurological deficits. Extremities: Symmetric 5 x 5 power.   Data Reviewed: Basic Metabolic Panel:  Recent Labs Lab 06/24/14 1821 06/26/14 0605  NA 134* 137  K 5.1 4.3  CL 101 103  CO2 25 30  GLUCOSE 272* 210*  BUN 23 21  CREATININE 1.61* 1.50*  CALCIUM 8.6 8.2*   Liver Function Tests: No results for input(s): AST, ALT, ALKPHOS, BILITOT, PROT, ALBUMIN in the last 168 hours. No results for input(s): LIPASE, AMYLASE in the last 168 hours. No results for input(s): AMMONIA in the last 168 hours. CBC:  Recent Labs Lab 06/24/14 1821 06/26/14 0605 06/27/14 0440  WBC 11.0* 10.2 10.6*  HGB 10.6* 9.5* 9.2*  HCT 33.7* 30.5* 29.6*  MCV 87.1 87.1 86.8  PLT 280 274 263   Cardiac Enzymes:  Recent Labs Lab 06/25/14 0155 06/25/14 0748 06/25/14 1410  TROPONINI <0.03 <0.03 <0.03   BNP (last 3 results)  Recent Labs  10/14/13 1644  PROBNP 150.2   CBG:  Recent Labs Lab 06/26/14 1108 06/26/14 1629 06/26/14 2217 06/27/14 0326 06/27/14 0614  GLUCAP 162* 418* 133* 133* 140*    No results found for this or any previous visit (from the past 240 hour(s)).         Studies: Nm Myocar Multi W/spect W/wall Motion / Ef  06/26/2014   CLINICAL DATA:  This is a 80 y.o. female with a past medical history significant for CAD with past inferior MI in 2004. She has had bare-metal  stent to the RCA in 2004 and drug-eluting stent placement to the RCA in 06/2010 in the setting of unstable angina with overall preserved LV function. Last cath in 2012 showing otherwise nonobstructive disease. She presents with dyspnea and new ECG changes  EXAM: MYOCARDIAL IMAGING WITH SPECT (REST AND PHARMACOLOGIC-STRESS - 2 DAY PROTOCOL)  GATED LEFT VENTRICULAR WALL MOTION STUDY  LEFT VENTRICULAR EJECTION FRACTION  TECHNIQUE: Standard myocardial SPECT imaging was performed after resting intravenous injection of 10 mCi Tc-46m sestamibi. Subsequently, on a second day, intravenous infusion of Lexiscan was performed under the supervision of the Cardiology staff. At peak effect of the drug, 30 mCi Tc-4m sestamibi was injected intravenously and standard myocardial SPECT imaging was performed. Quantitative gated imaging was also performed to evaluate left ventricular wall motion, and estimate left ventricular ejection fraction.  COMPARISON:  None.  FINDINGS: Perfusion: There is a moderate size, moderate severity perfusion defect involving most of the inferior wall and a small mild anteroapical perfusion defect on the stress images. Both defects improve on the resting  images.  Wall Motion: Borderline low left ventricular systolic function with mild inferior hypokinesis . No left ventricular dilation.  Left Ventricular Ejection Fraction: 51 %  End diastolic volume 123XX123 ml  End systolic volume 62 ml  IMPRESSION: 1.  Moderate inferior ischemia and mild anteroapical ischemia.  2.  Inferior wall hypokinesis.  3. Left ventricular ejection fraction 51%  4. High-risk stress test findings*.  Suggestive of multivessel CAD.  *2012 Appropriate Use Criteria for Coronary Revascularization Focused Update: J Am Coll Cardiol. [id number]. http://content.[email or web address].aspx?articleid=1201161   Electronically Signed   By: Sanda Klein   On: 06/26/2014 16:57        Scheduled Meds: . aspirin EC  81 mg Oral Daily  .  colchicine  0.6 mg Oral Daily  . DULoxetine  60 mg Oral Daily  . insulin aspart  0-9 Units Subcutaneous TID WC  . insulin aspart  35 Units Subcutaneous BID WC  . insulin glargine  120 Units Subcutaneous QHS  . isosorbide mononitrate  60 mg Oral Daily  . Linaclotide  145 mcg Oral Daily  . metoprolol succinate  25 mg Oral Daily  . pantoprazole  40 mg Oral Daily  . pravastatin  20 mg Oral q1800  . regadenoson  0.4 mg Intravenous Once  . sodium chloride  3 mL Intravenous Q12H   Continuous Infusions: . [START ON 06/28/2014] sodium chloride    . heparin 1,600 Units/hr (06/27/14 0559)    Principal Problem:   DOE (dyspnea on exertion) Active Problems:   Hyperlipidemia   Essential hypertension   CAD (coronary artery disease)   Pulmonary embolus   Chronic diastolic congestive heart failure   DM type 2 causing CKD stage 3   Long term current use of anticoagulant therapy   Debility   OSA (obstructive sleep apnea)   COPD (chronic obstructive pulmonary disease)   Abnormal stress test    Time spent: 25 minutes.    Phillips Climes, MD. [facility] Pager [phone]  If 7PM-7AM, please contact night-coverage [email or web address] Password [email or web address] 06/27/2014, 11:16 AM    LOS: 2 days

## 2014-06-27 NOTE — Progress Notes (Signed)
SUBJECTIVE:  Denies chest pain or SOB  OBJECTIVE:   Vitals:   Filed Vitals:   06/26/14 1516 06/26/14 1542 06/26/14 2044 06/27/14 0318  BP: 148/73  134/60 135/56  Pulse: 97  87 80  Temp:  97.5 F (36.4 C) 98.6 F (37 C) 98 F (36.7 C)  TempSrc:  Oral Oral Oral  Resp:  19 18 18   Height:      Weight:      SpO2:  100% 98% 98%   I&O's:   Intake/Output Summary (Last 24 hours) at 06/27/14 [id number] Last data filed at 06/26/14 1740  Gross per 24 hour  Intake    220 ml  Output      0 ml  Net    220 ml   TELEMETRY: Reviewed telemetry pt in NSR:     PHYSICAL EXAM General: Well developed, well nourished, in no acute distress Head: Eyes PERRLA, No xanthomas.   Normal cephalic and atramatic  Lungs:   Clear bilaterally to auscultation and percussion. Heart:   HRRR S1 S2 Pulses are 2+ & equal. Abdomen: Bowel sounds are positive, abdomen soft and non-tender without masses Extremities:   No clubbing, cyanosis or edema.  DP +1 Neuro: Alert and oriented X 3. Psych:  Good affect, responds appropriately   LABS: Basic Metabolic Panel:  Recent Labs  06/24/14 1821 06/26/14 0605  NA 134* 137  K 5.1 4.3  CL 101 103  CO2 25 30  GLUCOSE 272* 210*  BUN 23 21  CREATININE 1.61* 1.50*  CALCIUM 8.6 8.2*   Liver Function Tests: No results for input(s): AST, ALT, ALKPHOS, BILITOT, PROT, ALBUMIN in the last 72 hours. No results for input(s): LIPASE, AMYLASE in the last 72 hours. CBC:  Recent Labs  06/26/14 0605 06/27/14 0440  WBC 10.2 10.6*  HGB 9.5* 9.2*  HCT 30.5* 29.6*  MCV 87.1 86.8  PLT 274 263   Cardiac Enzymes:  Recent Labs  06/25/14 0155 06/25/14 0748 06/25/14 1410  TROPONINI <0.03 <0.03 <0.03   BNP: Invalid input(s): POCBNP D-Dimer:  Recent Labs  06/24/14 1821  DDIMER 0.30   Hemoglobin A1C:  Recent Labs  06/25/14 0155  HGBA1C 9.9*   Fasting Lipid Panel: No results for input(s): CHOL, HDL, LDLCALC, TRIG, CHOLHDL, LDLDIRECT in the last 72  hours. Thyroid Function Tests:  Recent Labs  06/25/14 0155  TSH 0.733   Anemia Panel: No results for input(s): VITAMINB12, FOLATE, FERRITIN, TIBC, IRON, RETICCTPCT in the last 72 hours. Coag Panel:   Lab Results  Component Value Date   INR 1.84* 06/27/2014   INR 1.66* 06/26/2014   INR 1.74* 06/25/2014    RADIOLOGY: Dg Chest 2 View  06/24/2014   CLINICAL DATA:  Shortness of breath for 2 weeks, LEFT arm pain radiating to LEFT chest, history coronary artery disease post MI and coronary stenting, hypertension, type 2 diabetes, CHF, hyperlipidemia, COPD  EXAM: CHEST  2 VIEW  COMPARISON:  04/21/2014  FINDINGS: Upper normal heart size.  Normal mediastinal contours.  Atherosclerotic calcification at aortic arch.  Minimal vascular congestion.  Decreased lung volumes since previous exam with mild bibasilar atelectasis.  Upper lungs clear.  No pleural effusion or pneumothorax.  Scattered endplate spur formation thoracic spine with generalized osseous demineralization.  IMPRESSION: Bibasilar atelectasis.   Electronically Signed   By: Lavonia [name] M.D.   On: 06/24/2014 19:35   Nm Myocar Multi W/spect W/wall Motion / Ef  06/26/2014   CLINICAL DATA:  This is a 80 y.o. female  with a past medical history significant for CAD with past inferior MI in 2004. She has had bare-metal stent to the RCA in 2004 and drug-eluting stent placement to the RCA in 06/2010 in the setting of unstable angina with overall preserved LV function. Last cath in 2012 showing otherwise nonobstructive disease. She presents with dyspnea and new ECG changes  EXAM: MYOCARDIAL IMAGING WITH SPECT (REST AND PHARMACOLOGIC-STRESS - 2 DAY PROTOCOL)  GATED LEFT VENTRICULAR WALL MOTION STUDY  LEFT VENTRICULAR EJECTION FRACTION  TECHNIQUE: Standard myocardial SPECT imaging was performed after resting intravenous injection of 10 mCi Tc-72m sestamibi. Subsequently, on a second day, intravenous infusion of Lexiscan was performed under the supervision of  the Cardiology staff. At peak effect of the drug, 30 mCi Tc-65m sestamibi was injected intravenously and standard myocardial SPECT imaging was performed. Quantitative gated imaging was also performed to evaluate left ventricular wall motion, and estimate left ventricular ejection fraction.  COMPARISON:  None.  FINDINGS: Perfusion: There is a moderate size, moderate severity perfusion defect involving most of the inferior wall and a small mild anteroapical perfusion defect on the stress images. Both defects improve on the resting images.  Wall Motion: Borderline low left ventricular systolic function with mild inferior hypokinesis . No left ventricular dilation.  Left Ventricular Ejection Fraction: 51 %  End diastolic volume 123XX123 ml  End systolic volume 62 ml  IMPRESSION: 1.  Moderate inferior ischemia and mild anteroapical ischemia.  2.  Inferior wall hypokinesis.  3. Left ventricular ejection fraction 51%  4. High-risk stress test findings*.  Suggestive of multivessel CAD.  *2012 Appropriate Use Criteria for Coronary Revascularization Focused Update: J Am Coll Cardiol. [id number]. http://[email or web address].aspx?articleid=1201161   Electronically Signed   By: Sanda Klein   On: 06/26/2014 16:57   ASSESSMENT AND PLAN  1. Exertional dyspnea/CAD: R/o for MI. Prior h/o RCA stenting in 2012, presenting with progressive DOE. Cardioilte yesterday was high risk with evidence of inferior and anteroapical ischemia. NL LV. Cont asa, statin, bb, nitrate. Hold lasix over the weekend and hydrate on Sunday. Plan cath on Monday.   2. HTN: Stable on bb/nitrate.  3. HL: Cont statin.  4. DM: Per IM.  5. CKD III: Follow creat over the weekend. Hold lasix.  6. H/O PE on chronic coumadin: INR 1.66 today. Hold coumadin - bridge with heparin.  7. Chronic diastolic chf: Volume difficult to judge but appears to be stable.     Sueanne Margarita, MD  06/27/2014  9:03 AM

## 2014-06-27 NOTE — Progress Notes (Signed)
06/27/2014 10:31 PM The patient wanted to get a medicine to help her sleep but she did not have anything ordered for that.  I paged Dr. Frederik Pear, who ordered her a one time dose of benadryl by mouth 25 mg.  The patient took the amount of benadryl ordered.  Will continue to monitor the patient.  [name]

## 2014-06-27 NOTE — Progress Notes (Signed)
UR completed 

## 2014-06-28 LAB — BASIC METABOLIC PANEL
Anion gap: 4 — ABNORMAL LOW (ref 5–15)
BUN: 18 mg/dL (ref 6–23)
CO2: 28 mmol/L (ref 19–32)
Calcium: 8.3 mg/dL — ABNORMAL LOW (ref 8.4–10.5)
Chloride: 105 mmol/L (ref 96–112)
Creatinine, Ser: 1.26 mg/dL — ABNORMAL HIGH (ref 0.50–1.10)
GFR calc Af Amer: 46 mL/min — ABNORMAL LOW (ref >90)
GFR calc non Af Amer: 40 mL/min — ABNORMAL LOW (ref >90)
Glucose, Bld: 186 mg/dL — ABNORMAL HIGH (ref 70–99)
Potassium: 4.4 mmol/L (ref 3.5–5.1)
Sodium: 137 mmol/L (ref 135–145)

## 2014-06-28 LAB — GLUCOSE, CAPILLARY
Glucose-Capillary: 147 mg/dL — ABNORMAL HIGH (ref 70–99)
Glucose-Capillary: 57 mg/dL — ABNORMAL LOW (ref 70–99)
Glucose-Capillary: 90 mg/dL (ref 70–99)
Glucose-Capillary: 205 mg/dL — ABNORMAL HIGH (ref 70–99)
Glucose-Capillary: 335 mg/dL — ABNORMAL HIGH (ref 70–99)
Glucose-Capillary: 290 mg/dL — ABNORMAL HIGH (ref 70–99)

## 2014-06-28 LAB — PROTIME-INR
INR: 2.13 — ABNORMAL HIGH (ref 0.00–1.49)
INR: 1.84 — ABNORMAL HIGH (ref 0.00–1.49)
Prothrombin Time: 24 seconds — ABNORMAL HIGH (ref 11.6–15.2)
Prothrombin Time: 21.4 seconds — ABNORMAL HIGH (ref 11.6–15.2)

## 2014-06-28 LAB — CBC
HCT: 30.6 % — ABNORMAL LOW (ref 36.0–46.0)
Hemoglobin: 9.3 g/dL — ABNORMAL LOW (ref 12.0–15.0)
MCH: 26.6 pg (ref 26.0–34.0)
MCHC: 30.4 g/dL (ref 30.0–36.0)
MCV: 87.4 fL (ref 78.0–100.0)
Platelets: 270 10*3/uL (ref 150–400)
RBC: 3.5 MIL/uL — ABNORMAL LOW (ref 3.87–5.11)
RDW: 15.4 % (ref 11.5–15.5)
WBC: 10.2 10*3/uL (ref 4.0–10.5)

## 2014-06-28 LAB — HEPARIN LEVEL (UNFRACTIONATED): Heparin Unfractionated: 0.54 IU/mL (ref 0.30–0.70)

## 2014-06-28 MED ORDER — HEPARIN (PORCINE) IN NACL 100-0.45 UNIT/ML-% IJ SOLN
2100.0000 [IU]/h | INTRAMUSCULAR | Status: DC
  Administered 2014-06-28 (×2): 2100 [IU]/h via INTRAVENOUS
  Filled 2014-06-28 (×5): qty 250

## 2014-06-28 MED ORDER — INSULIN GLARGINE 100 UNIT/ML ~~LOC~~ SOLN
80.0000 [IU] | Freq: Every day | SUBCUTANEOUS | Status: DC
  Administered 2014-06-28 – 2014-06-29 (×2): 80 [IU] via SUBCUTANEOUS
  Filled 2014-06-28 (×2): qty 0.8

## 2014-06-28 MED ORDER — PHYTONADIONE 5 MG PO TABS
2.5000 mg | ORAL_TABLET | Freq: Once | ORAL | Status: AC
  Administered 2014-06-28: 2.5 mg via ORAL
  Filled 2014-06-28: qty 1

## 2014-06-28 NOTE — Progress Notes (Signed)
ANTICOAGULATION CONSULT NOTE - Follow Up Consult  Pharmacy Consult for heparin Indication: hx DVT and r/o ACS  Allergies  Allergen Reactions  . Sulfonamide Derivatives Swelling    To mouth    Patient Measurements: Height: 5\' 7"  (170.2 cm) Weight: (!) 371 lb (168.284 kg) IBW/kg (Calculated) : 61.6 Heparin Dosing Weight: 104 kg  Vital Signs: Temp: 98.2 F (36.8 C) (02/21 0403) Temp Source: Oral (02/21 0403) BP: 125/63 mmHg (02/21 1054) Pulse Rate: 93 (02/21 1054)  Labs:  Recent Labs  06/25/14 1410  06/26/14 0605  06/27/14 0440 06/27/14 1208 06/27/14 2150 06/28/14 0347 06/28/14 0800  HGB  --   < > 9.5*  --  9.2*  --   --  9.3*  --   HCT  --   --  30.5*  --  29.6*  --   --  30.6*  --   PLT  --   --  274  --  263  --   --  270  --   LABPROT  --   --  19.8*  --  21.5*  --   --  24.0*  --   INR  --   --  1.66*  --  1.84*  --   --  2.13*  --   HEPARINUNFRC  --   --   --   < > 0.23* 0.17* 0.23*  --  0.54  CREATININE  --   --  1.50*  --   --  1.44*  --  1.26*  --   TROPONINI <0.03  --   --   --   --   --   --   --   --   < > = values in this interval not displayed.  Estimated Creatinine Clearance: 61.6 mL/min (by C-G formula based on Cr of 1.26).   Assessment: Patient is a 80 y.o F on coumadin PTA for hx DVT with stress test suggestive of "multivessell CAD." Coumadin currently on hold and bridging with heparin in anticipation of cardiac cath procedure on Monday 2/22. Last dose of coumadin was given on 2/19 with INR increased from 1.84 to 2.13 today.  Vit K 2.5mg  PO x1 today.  Heparin level was therapeutic at 0.54 this morning.  D/W Dr. Landis Gandy, with INR at lower end of therapeutic range and reversing with vit K today --will continue with heparin drip for now.  Goal of Therapy:  Heparin level 0.3-0.7 units/ml Monitor platelets by anticoagulation protocol: Yes   Plan:  - continue heparin drip at 2100 units/hr - f/u with INR at 10PM today (s/p vit K dose) - hold  coumadin for cath on 2/22   Morrisa Aldaba P 06/28/2014,10:56 AM

## 2014-06-28 NOTE — Progress Notes (Addendum)
PROGRESS NOTE    [name] [id number] DOB: Jan 16, 1935 DOA: 06/24/2014 PCP: Blanchie Serve, MD  HPI/Brief narrative 80 y.o. female morbidly obese, with hx of sleep apnea CPAP non compliant, COPD, known CAD s/p 2 stent placement several years ago, hx of chronic diastolic CHF, hx of DM/IDDM on high doses of insulin, HTN, HLD, lower extremity DVT on Coumadin, presents to the ER with increase DOE for the past 2 weeks. She has no orthopnea or PND, and denied exertional CP. There has been no coughs, fever, chills, but she has audible wheezing. Evaluation in the ER showed EKG with NSR and no acute ST T changes, negative initial troponin, and negative D Dimer. Her INR was 1.92, BNP of only 62, and her CXR showed only minimal vascular congestion. Hospitalist was asked to admit her for further evaluation, with concern for cardiac ischemia. She gives a long history of intermittent left arm cramping associated with substernal cramping-without without exertion, relieved by sublingual NTG. She apparently gets these episodes once every couple of months. Stress test 5 years ago apparently negative. States they were unable to do cardiac cath secondary to renal insufficiency. Gives history of productive cough 3 weeks ago-improved after OTC medications. Cardiology consulted.  Assessment/Plan:   Dyspnea on exertion:  -Probably multifactorial related to morbid obesity, OSA/possible OHS, basal atelectasis, ? Acute on chronic diastolic CHF and residual acute bronchitis.  -Chest x-ray: Bibasal atelectasis without acute findings. Troponin 3 negative. D-dimer negative. Advised compliance with nightly CPAP.  -Cardiology input appreciated. 2-D echo: LVEF 60-65% and grade 1 diastolic dysfunction. No personal history of smoking or passive inhalation: Not sure if she truly has COPD.   Chest pain/History of CAD status post stents -? Angina. The patient has several risk factors for CAD. Cardiology input appreciated.   -No chest pain since admission.  -Troponin 3 negative.  - R/o for MI. Prior h/o RCA stenting in 2012, presenting with progressive DOE. Cardioilte 2/19 was high risk with evidence of inferior and anteroapical ischemia. NL LV. Cont asa, statin, bb, nitrate. Hold lasix over the weekend and hydrate on Sunday. Plan cath on Monday - Continue metoprolol and nitrates. Add aspirin. Management per cardiology.  Possible mild acute on chronic diastolic CHF: - Hold Lasix as planned for cardiac cath on Monday.  OSA:  -Noncompliant with C Pap at home due to discomfort. C Pap daily at bedtime in hospital. Outpatient follow-up with pulmonology.  Uncontrolled DM 2 with renal complications: - Continue home dose of long-acting insulin and reduced dose of lispro. Added sensitive SSI without bedtime NovoLog.  Hypoglycemia this a.m., going to be nothing by mouth after night for cardiac cath, will decrease Lantus from 120 net at bedtime to 80 units at bedtime only for tonight. -  A1c: 9.9 suggesting poor outpatient control.  Essential hypertension:  -Continue metoprolol and nitrates.  History of dyslipidemia:  -Continue statins  Anemia:  -Likely related to chronic kidney disease. Stable.  Stage III chronic kidney disease:  -Creatinine may be close to baseline.   History of lower extremity DVT:  -Last episode 2 years ago. Continue with heparin drip as on for cardiac cath on Monday. - INR is 2.13 today, will hold heparin gtt.will give 2.5 mg of oral vitamin K so she can have her cardiac cath in a.m. Recheck INR in PM.   Code Status: Full Family Communication: None at bedside Disposition Plan: Cardiac cath on Monday.   Consultants:  Cardiology  Procedures:  Nuclear stress test 2/19  Antibiotics:  None   Subjective: Currently denies dyspnea or chest pain.   Objective: Filed Vitals:   06/27/14 1153 06/27/14 1336 06/27/14 2045 06/28/14 0403  BP: 142/72 122/81 151/65 141/68  Pulse:  78 77 72 77  Temp:  98.5 F (36.9 C) 97.9 F (36.6 C) 98.2 F (36.8 C)  TempSrc:  Oral Oral Oral  Resp:  18 18 20   Height:      Weight:      SpO2:  97% 96% 97%    Intake/Output Summary (Last 24 hours) at 06/28/14 1021 Last data filed at 06/28/14 0930  Gross per 24 hour  Intake    510 ml  Output   1124 ml  Net   -614 ml   Filed Weights   06/25/14 1348  Weight: 168.284 kg (371 lb)     Exam:  General exam: Moderately built and morbidly obese female patient lying comfortably supine in bed  Respiratory system: clear to auscultation . No increased work of breathing. Cardiovascular system: S1 & S2 heard, RRR. No JVD, murmurs, gallops, clicks or pedal edema. Telemetry: Sinus rhythm. Gastrointestinal system: Abdomen is nondistended, soft and nontender. Normal bowel sounds heard. Central nervous system: Alert and oriented. No focal neurological deficits. Extremities: Symmetric 5 x 5 power.   Data Reviewed: Basic Metabolic Panel:  Recent Labs Lab 06/24/14 1821 06/26/14 0605 06/27/14 1208 06/28/14 0347  NA 134* 137 137 137  K 5.1 4.3 4.3 4.4  CL 101 103 100 105  CO2 25 30 25 28   GLUCOSE 272* 210* 151* 186*  BUN 23 21 17 18   CREATININE 1.61* 1.50* 1.44* 1.26*  CALCIUM 8.6 8.2* 8.8 8.3*   Liver Function Tests: No results for input(s): AST, ALT, ALKPHOS, BILITOT, PROT, ALBUMIN in the last 168 hours. No results for input(s): LIPASE, AMYLASE in the last 168 hours. No results for input(s): AMMONIA in the last 168 hours. CBC:  Recent Labs Lab 06/24/14 1821 06/26/14 0605 06/27/14 0440 06/28/14 0347  WBC 11.0* 10.2 10.6* 10.2  HGB 10.6* 9.5* 9.2* 9.3*  HCT 33.7* 30.5* 29.6* 30.6*  MCV 87.1 87.1 86.8 87.4  PLT 280 274 263 270   Cardiac Enzymes:  Recent Labs Lab 06/25/14 0155 06/25/14 0748 06/25/14 1410  TROPONINI <0.03 <0.03 <0.03   BNP (last 3 results)  Recent Labs  10/14/13 1644  PROBNP 150.2   CBG:  Recent Labs Lab 06/27/14 1614 06/27/14 2042  06/28/14 0613 06/28/14 0935 06/28/14 0950  GLUCAP 268* 189* 147* 57* 90    No results found for this or any previous visit (from the past 240 hour(s)).         Studies: Nm Myocar Multi W/spect W/wall Motion / Ef  06/26/2014   CLINICAL DATA:  This is a 81 y.o. female with a past medical history significant for CAD with past inferior MI in 2004. She has had bare-metal stent to the RCA in 2004 and drug-eluting stent placement to the RCA in 06/2010 in the setting of unstable angina with overall preserved LV function. Last cath in 2012 showing otherwise nonobstructive disease. She presents with dyspnea and new ECG changes  EXAM: MYOCARDIAL IMAGING WITH SPECT (REST AND PHARMACOLOGIC-STRESS - 2 DAY PROTOCOL)  GATED LEFT VENTRICULAR WALL MOTION STUDY  LEFT VENTRICULAR EJECTION FRACTION  TECHNIQUE: Standard myocardial SPECT imaging was performed after resting intravenous injection of 10 mCi Tc-56m sestamibi. Subsequently, on a second day, intravenous infusion of Lexiscan was performed under the supervision of the Cardiology staff. At peak effect of the  drug, 30 mCi Tc-74m sestamibi was injected intravenously and standard myocardial SPECT imaging was performed. Quantitative gated imaging was also performed to evaluate left ventricular wall motion, and estimate left ventricular ejection fraction.  COMPARISON:  None.  FINDINGS: Perfusion: There is a moderate size, moderate severity perfusion defect involving most of the inferior wall and a small mild anteroapical perfusion defect on the stress images. Both defects improve on the resting images.  Wall Motion: Borderline low left ventricular systolic function with mild inferior hypokinesis . No left ventricular dilation.  Left Ventricular Ejection Fraction: 51 %  End diastolic volume 123XX123 ml  End systolic volume 62 ml  IMPRESSION: 1.  Moderate inferior ischemia and mild anteroapical ischemia.  2.  Inferior wall hypokinesis.  3. Left ventricular ejection fraction  51%  4. High-risk stress test findings*.  Suggestive of multivessel CAD.  *2012 Appropriate Use Criteria for Coronary Revascularization Focused Update: J Am Coll Cardiol. N6492421. http://content.[email or web address].aspx?articleid=1201161   Electronically Signed   By: Sanda Klein   On: 06/26/2014 16:57        Scheduled Meds: . aspirin EC  81 mg Oral Daily  . colchicine  0.6 mg Oral Daily  . DULoxetine  60 mg Oral Daily  . insulin aspart  0-9 Units Subcutaneous TID WC  . insulin aspart  35 Units Subcutaneous BID WC  . insulin glargine  120 Units Subcutaneous QHS  . isosorbide mononitrate  60 mg Oral Daily  . Linaclotide  145 mcg Oral Daily  . metoprolol succinate  25 mg Oral Daily  . pantoprazole  40 mg Oral Daily  . pravastatin  20 mg Oral q1800  . regadenoson  0.4 mg Intravenous Once  . sodium chloride  3 mL Intravenous Q12H   Continuous Infusions: . sodium chloride 75 mL/hr at 06/28/14 [id number]    Principal Problem:   DOE (dyspnea on exertion) Active Problems:   Hyperlipidemia   Essential hypertension   CAD (coronary artery disease)   Pulmonary embolus   Chronic diastolic congestive heart failure   DM type 2 causing CKD stage 3   Long term current use of anticoagulant therapy   Debility   OSA (obstructive sleep apnea)   COPD (chronic obstructive pulmonary disease)   Abnormal stress test    Time spent: 25 minutes.    Phillips Climes, MD. [facility] Pager [phone]  If 7PM-7AM, please contact night-coverage [email or web address] Password [email or web address] 06/28/2014, 10:21 AM    LOS: 3 days

## 2014-06-28 NOTE — Progress Notes (Signed)
SUBJECTIVE:  Denies chest pain or SOB  OBJECTIVE:   Vitals:   Filed Vitals:   06/27/14 1153 06/27/14 1336 06/27/14 2045 06/28/14 0403  BP: 142/72 122/81 151/65 141/68  Pulse: 78 77 72 77  Temp:  98.5 F (36.9 C) 97.9 F (36.6 C) 98.2 F (36.8 C)  TempSrc:  Oral Oral Oral  Resp:  18 18 20   Height:      Weight:      SpO2:  97% 96% 97%   I&O's:   Intake/Output Summary (Last 24 hours) at 06/28/14 0758 Last data filed at 06/28/14 0404  Gross per 24 hour  Intake    510 ml  Output    725 ml  Net   -215 ml   TELEMETRY: Reviewed telemetry pt in NSR:     PHYSICAL EXAM General: Well developed, well nourished, in no acute distress  Lungs:   Clear bilaterally to auscultation and percussion. Heart:   HRRR S1 S2 Pulses are 2+ & equal. Abdomen: Bowel sounds are positive, abdomen soft and non-tender without masses Extremities:   No clubbing, cyanosis or edema.  DP +1 Neuro: Alert and oriented X 3. Psych:  Good affect, responds appropriately   LABS: Basic Metabolic Panel:  Recent Labs  06/27/14 1208 06/28/14 0347  NA 137 137  K 4.3 4.4  CL 100 105  CO2 25 28  GLUCOSE 151* 186*  BUN 17 18  CREATININE 1.44* 1.26*  CALCIUM 8.8 8.3*   Liver Function Tests: No results for input(s): AST, ALT, ALKPHOS, BILITOT, PROT, ALBUMIN in the last 72 hours. No results for input(s): LIPASE, AMYLASE in the last 72 hours. CBC:  Recent Labs  06/27/14 0440 06/28/14 0347  WBC 10.6* 10.2  HGB 9.2* 9.3*  HCT 29.6* 30.6*  MCV 86.8 87.4  PLT 263 270   Cardiac Enzymes:  Recent Labs  06/25/14 1410  TROPONINI <0.03   BNP: Invalid input(s): POCBNP D-Dimer: No results for input(s): DDIMER in the last 72 hours. Hemoglobin A1C: No results for input(s): HGBA1C in the last 72 hours. Fasting Lipid Panel: No results for input(s): CHOL, HDL, LDLCALC, TRIG, CHOLHDL, LDLDIRECT in the last 72 hours. Thyroid Function Tests: No results for input(s): TSH, T4TOTAL, T3FREE, THYROIDAB in the  last 72 hours.  Invalid input(s): FREET3 Anemia Panel: No results for input(s): VITAMINB12, FOLATE, FERRITIN, TIBC, IRON, RETICCTPCT in the last 72 hours. Coag Panel:   Lab Results  Component Value Date   INR 2.13* 06/28/2014   INR 1.84* 06/27/2014   INR 1.66* 06/26/2014    RADIOLOGY: Dg Chest 2 View  06/24/2014   CLINICAL DATA:  Shortness of breath for 2 weeks, LEFT arm pain radiating to LEFT chest, history coronary artery disease post MI and coronary stenting, hypertension, type 2 diabetes, CHF, hyperlipidemia, COPD  EXAM: CHEST  2 VIEW  COMPARISON:  04/21/2014  FINDINGS: Upper normal heart size.  Normal mediastinal contours.  Atherosclerotic calcification at aortic arch.  Minimal vascular congestion.  Decreased lung volumes since previous exam with mild bibasilar atelectasis.  Upper lungs clear.  No pleural effusion or pneumothorax.  Scattered endplate spur formation thoracic spine with generalized osseous demineralization.  IMPRESSION: Bibasilar atelectasis.   Electronically Signed   By: Lavonia [name] M.D.   On: 06/24/2014 19:35   Nm Myocar Multi W/spect W/wall Motion / Ef  06/26/2014   CLINICAL DATA:  This is a 80 y.o. female with a past medical history significant for CAD with past inferior MI in 2004. She has  had bare-metal stent to the RCA in 2004 and drug-eluting stent placement to the RCA in 06/2010 in the setting of unstable angina with overall preserved LV function. Last cath in 2012 showing otherwise nonobstructive disease. She presents with dyspnea and new ECG changes  EXAM: MYOCARDIAL IMAGING WITH SPECT (REST AND PHARMACOLOGIC-STRESS - 2 DAY PROTOCOL)  GATED LEFT VENTRICULAR WALL MOTION STUDY  LEFT VENTRICULAR EJECTION FRACTION  TECHNIQUE: Standard myocardial SPECT imaging was performed after resting intravenous injection of 10 mCi Tc-42m sestamibi. Subsequently, on a second day, intravenous infusion of Lexiscan was performed under the supervision of the Cardiology staff. At peak effect  of the drug, 30 mCi Tc-44m sestamibi was injected intravenously and standard myocardial SPECT imaging was performed. Quantitative gated imaging was also performed to evaluate left ventricular wall motion, and estimate left ventricular ejection fraction.  COMPARISON:  None.  FINDINGS: Perfusion: There is a moderate size, moderate severity perfusion defect involving most of the inferior wall and a small mild anteroapical perfusion defect on the stress images. Both defects improve on the resting images.  Wall Motion: Borderline low left ventricular systolic function with mild inferior hypokinesis . No left ventricular dilation.  Left Ventricular Ejection Fraction: 51 %  End diastolic volume 123XX123 ml  End systolic volume 62 ml  IMPRESSION: 1.  Moderate inferior ischemia and mild anteroapical ischemia.  2.  Inferior wall hypokinesis.  3. Left ventricular ejection fraction 51%  4. High-risk stress test findings*.  Suggestive of multivessel CAD.  *2012 Appropriate Use Criteria for Coronary Revascularization Focused Update: J Am Coll Cardiol. [id number]. http://content.[email or web address].aspx?articleid=1201161   Electronically Signed   By: Sanda Klein   On: 06/26/2014 16:57    ASSESSMENT AND PLAN  1. Exertional dyspnea/CAD: R/o for MI. Prior h/o RCA stenting in 2012, presenting with progressive DOE. Cardioilte was high risk with evidence of inferior and anteroapical ischemia. NL LV. Cont asa, statin, bb, nitrate. Lasix on hold for cath.  Plan cath on Monday.   2. HTN: Stable on bb/nitrate.  3. HL: Cont statin.  4. DM: Per IM.  5. CKD III: Creatinine improved withholding lasix and IVF hydration.  6. H/O PE on chronic coumadin: INR 2.13 today - ? Lab error since coumadin has been on hold for several days.  Will recheck and if still above 2 then give low dose vitamin K. Continue to bridge with heparin.  7. Chronic diastolic chf: Volume difficult to judge but appears to be  stable.   Nichole Margarita, MD  06/28/2014  7:58 AM

## 2014-06-28 NOTE — Progress Notes (Signed)
06/28/2014 0935 Pt. Stated did not feel well, cbg checked-57.  OJ and peanut butter crackers given.  Rechecked cbg-90. [place]

## 2014-06-28 NOTE — Progress Notes (Signed)
06/28/2014 10:42 PM Dr. [facility] called and asked for the patient's recent INR level. The last INR done at 07:58 PM was 1.84. The patient has still been receiving Heparin IV at 2,100 Units/hour.  Dr. [facility] said to order the patient a one time dose of Vitamin K 2.5 mg oral because she is going for a cardiac cath tomorrow.  The patient was given the Vitamin K as ordered.  Will continue to monitor the patient. Lupita Dawn

## 2014-06-28 NOTE — Progress Notes (Signed)
Patient refuses CPAP tonight.

## 2014-06-29 ENCOUNTER — Encounter (HOSPITAL_COMMUNITY): Payer: Self-pay | Admitting: Cardiovascular Disease

## 2014-06-29 ENCOUNTER — Encounter (HOSPITAL_COMMUNITY)
Admission: EM | Disposition: A | Discharge status: Home / Self Care | Payer: Self-pay | Source: Home / Self Care | Attending: Internal Medicine

## 2014-06-29 HISTORY — PX: LEFT HEART CATHETERIZATION WITH CORONARY ANGIOGRAM: SHX5451

## 2014-06-29 LAB — GLUCOSE, CAPILLARY
Glucose-Capillary: 135 mg/dL — ABNORMAL HIGH (ref 70–99)
Glucose-Capillary: 129 mg/dL — ABNORMAL HIGH (ref 70–99)
Glucose-Capillary: 262 mg/dL — ABNORMAL HIGH (ref 70–99)
Glucose-Capillary: 176 mg/dL — ABNORMAL HIGH (ref 70–99)

## 2014-06-29 LAB — CBC
HCT: 28.4 % — ABNORMAL LOW (ref 36.0–46.0)
Hemoglobin: 8.9 g/dL — ABNORMAL LOW (ref 12.0–15.0)
MCH: 27.6 pg (ref 26.0–34.0)
MCHC: 31.3 g/dL (ref 30.0–36.0)
MCV: 87.9 fL (ref 78.0–100.0)
Platelets: 248 10*3/uL (ref 150–400)
RBC: 3.23 MIL/uL — ABNORMAL LOW (ref 3.87–5.11)
RDW: 15.4 % (ref 11.5–15.5)
WBC: 10.2 10*3/uL (ref 4.0–10.5)

## 2014-06-29 LAB — BASIC METABOLIC PANEL
Anion gap: 3 — ABNORMAL LOW (ref 5–15)
BUN: 15 mg/dL (ref 6–23)
CO2: 25 mmol/L (ref 19–32)
Calcium: 8 mg/dL — ABNORMAL LOW (ref 8.4–10.5)
Chloride: 109 mmol/L (ref 96–112)
Creatinine, Ser: 1.15 mg/dL — ABNORMAL HIGH (ref 0.50–1.10)
GFR calc Af Amer: 52 mL/min — ABNORMAL LOW (ref >90)
GFR calc non Af Amer: 45 mL/min — ABNORMAL LOW (ref >90)
Glucose, Bld: 163 mg/dL — ABNORMAL HIGH (ref 70–99)
Potassium: 4.4 mmol/L (ref 3.5–5.1)
Sodium: 137 mmol/L (ref 135–145)

## 2014-06-29 LAB — PROTIME-INR
INR: 1.73 — ABNORMAL HIGH (ref 0.00–1.49)
Prothrombin Time: 20.4 seconds — ABNORMAL HIGH (ref 11.6–15.2)

## 2014-06-29 LAB — HEPARIN LEVEL (UNFRACTIONATED): Heparin Unfractionated: 0.67 IU/mL (ref 0.30–0.70)

## 2014-06-29 SURGERY — LEFT HEART CATHETERIZATION WITH CORONARY ANGIOGRAM
Anesthesia: LOCAL

## 2014-06-29 MED ORDER — HEPARIN (PORCINE) IN NACL 100-0.45 UNIT/ML-% IJ SOLN
2000.0000 [IU]/h | INTRAMUSCULAR | Status: DC
  Administered 2014-06-29 – 2014-06-30 (×2): 2000 [IU]/h via INTRAVENOUS
  Filled 2014-06-29 (×3): qty 250

## 2014-06-29 MED ORDER — LIDOCAINE HCL (PF) 1 % IJ SOLN
INTRAMUSCULAR | Status: AC
  Filled 2014-06-29: qty 30

## 2014-06-29 MED ORDER — WARFARIN - PHARMACIST DOSING INPATIENT
Freq: Every day | Status: DC
  Administered 2014-06-29: 18:00:00

## 2014-06-29 MED ORDER — ASPIRIN 81 MG PO CHEW
81.0000 mg | CHEWABLE_TABLET | ORAL | Status: AC
  Administered 2014-06-29: 81 mg via ORAL
  Filled 2014-06-29: qty 1

## 2014-06-29 MED ORDER — PHYTONADIONE 5 MG PO TABS
2.5000 mg | ORAL_TABLET | Freq: Once | ORAL | Status: AC
  Administered 2014-06-29: 2.5 mg via ORAL
  Filled 2014-06-29: qty 1

## 2014-06-29 MED ORDER — ONDANSETRON HCL 4 MG/2ML IJ SOLN: 4.0000 mg | Freq: Four times a day (QID) | INTRAMUSCULAR | Status: DC | PRN

## 2014-06-29 MED ORDER — ACETAMINOPHEN 325 MG PO TABS: 650.0000 mg | ORAL_TABLET | ORAL | Status: DC | PRN

## 2014-06-29 MED ORDER — VERAPAMIL HCL 2.5 MG/ML IV SOLN
INTRAVENOUS | Status: AC
  Filled 2014-06-29: qty 2

## 2014-06-29 MED ORDER — HEPARIN SODIUM (PORCINE) 1000 UNIT/ML IJ SOLN
INTRAMUSCULAR | Status: AC
  Filled 2014-06-29: qty 1

## 2014-06-29 MED ORDER — ASPIRIN EC 81 MG PO TBEC
81.0000 mg | DELAYED_RELEASE_TABLET | Freq: Every day | ORAL | Status: DC
  Administered 2014-06-30: 81 mg via ORAL
  Filled 2014-06-29: qty 1

## 2014-06-29 MED ORDER — MIDAZOLAM HCL 2 MG/2ML IJ SOLN
INTRAMUSCULAR | Status: AC
  Filled 2014-06-29: qty 2

## 2014-06-29 MED ORDER — FENTANYL CITRATE 0.05 MG/ML IJ SOLN
INTRAMUSCULAR | Status: AC
  Filled 2014-06-29: qty 2

## 2014-06-29 MED ORDER — SODIUM CHLORIDE 0.9 % IV SOLN
INTRAVENOUS | Status: DC
  Administered 2014-06-29 (×2): via INTRAVENOUS

## 2014-06-29 MED ORDER — SODIUM CHLORIDE 0.9 % IV SOLN: 250.0000 mL | INTRAVENOUS | Status: DC | PRN

## 2014-06-29 MED ORDER — SODIUM CHLORIDE 0.9 % IV SOLN: INTRAVENOUS | Status: DC

## 2014-06-29 MED ORDER — SODIUM CHLORIDE 0.9 % IJ SOLN: 3.0000 mL | INTRAMUSCULAR | Status: DC | PRN

## 2014-06-29 MED ORDER — WARFARIN SODIUM 7.5 MG PO TABS
7.5000 mg | ORAL_TABLET | Freq: Once | ORAL | Status: AC
  Administered 2014-06-29: 7.5 mg via ORAL
  Filled 2014-06-29: qty 1

## 2014-06-29 MED ORDER — SODIUM CHLORIDE 0.9 % IJ SOLN: 3.0000 mL | Freq: Two times a day (BID) | INTRAMUSCULAR | Status: DC

## 2014-06-29 MED ORDER — NITROGLYCERIN 1 MG/10 ML FOR IR/CATH LAB
INTRA_ARTERIAL | Status: AC
  Filled 2014-06-29: qty 10

## 2014-06-29 NOTE — Interval H&P Note (Signed)
Cath Lab Visit (complete for each Cath Lab visit)  Clinical Evaluation Leading to the Procedure:   ACS: No.  Non-ACS:    Anginal Classification: CCS III  Anti-ischemic medical therapy: Maximal Therapy (2 or more classes of medications)  Non-Invasive Test Results: High-risk stress test findings: cardiac mortality >3%/year  Prior CABG: No previous CABG      History and Physical Interval Note:  06/29/2014 1:21 PM  Nelida Meuse GIZELE SPECIALE  has presented today for surgery, with the diagnosis of + stress  The various methods of treatment have been discussed with the patient and family. After consideration of risks, benefits and other options for treatment, the patient has consented to  Procedure(s): LEFT HEART CATHETERIZATION WITH CORONARY ANGIOGRAM (N/A) as a surgical intervention .  [name] history has been reviewed, patient examined, no change in status, stable for surgery.  I have reviewed the patient's chart and labs.  Questions were answered to the patient's satisfaction.     Adja Ruff A

## 2014-06-29 NOTE — Progress Notes (Signed)
Inpatient Diabetes Program Recommendations  AACE/ADA: New Consensus Statement on Inpatient Glycemic Control (2013)  Target Ranges:  Prepandial:   less than 140 mg/dL      Peak postprandial:   less than 180 mg/dL (1-2 hours)      Critically ill patients:  140 - 180 mg/dL   Inpatient Diabetes Program Recommendations Insulin - Basal: Noted lantus decreased back to 80 units daily from 120 units daily. Noted hypoglycemic event yesterday following breakfast. Appears that the hypoglycemia was most probably caused by the high dose meal coverage rather than the basal lantus. Insulin - Meal Coverage: Once eating again, please consider a decrease in meal coverage to 25 units bid and increase basal lantus up to 100 units. Can titrate up or down according to fasting glucose the following day.  Will follow and assess as needed.  Thank you Rosita Kea, RN, MSN, CDE  Diabetes Inpatient Program Office: [phone] Pager: [phone]

## 2014-06-29 NOTE — Care Management Note (Signed)
    Page 1 of 1   06/30/2014     3:16:26 PM CARE MANAGEMENT NOTE 06/30/2014  Patient:  [name], [name]   Account Number:  [id number]  Date Initiated:  06/29/2014  Documentation initiated by:  Whitman Hero  Subjective/Objective Assessment:   PTA from home with daughter admitted with DOE.     Action/Plan:   PT recommending HHPT, CM to F/U.   Anticipated DC Date:  06/30/2014   Anticipated DC Plan:  [place]         [facility] Choice  HOME HEALTH   Choice offered to / List presented to:  C-1 Patient        [place] arranged  [place].   Status of service:  In process, will continue to follow Medicare Important Message given?  YES (If response is "NO", the following Medicare IM given date fields will be blank) Date Medicare IM given:  06/29/2014 Medicare IM given by:  Whitman Hero Date Additional Medicare IM given:   Additional Medicare IM given by:    Discharge Disposition:  [place]  Per UR Regulation:  Reviewed for med. necessity/level of care/duration of stay  If discussed at [place] of Stay Meetings, dates discussed:   06/30/2014    Comments:  06/30/2014 @ [place] with pt  regarding HHPT/OT per therapist recommendations. Choice list offered. Pt agreed to continue using  [facility] services, was using services prior to admit. Rerral made with [facility], spoke with Katie.  Identified no other needs per CM.

## 2014-06-29 NOTE — CV Procedure (Signed)
[name] is a 80 y.o. female   [id number]  [id number] LOCATION:  FACILITY: [name]  PHYSICIAN: Troy Sine, MD, [facility] 1935/01/25   DATE OF PROCEDURE:  06/29/2014     CARDIAC CATHETERIZATION    HISTORY:   [name] is a 80 year old African-American female who has a history of morbid obesity, obstructive sleep apnea, who suffered an inferior wall MI in 2004 and underwent insertion of a bare metal stent to RCA.  In 2012 a DES stent was placed in her RCA.  She has been on chronic Coumadin therapy for PE since September 2013.  She has developed increasing episodes of exertional shortness of breath.  A nuclear perfusion study was interpreted as high risk with inferior and anteroapical ischemia.  Coumadin has been on hold.  She has received several doses of oral vitamin K.  She presents for cardiac catheterization.   PROCEDURE: Left heart catheterization via the right radial artery approach: Coronary angiography, left ventriculography  The patient was brought to the second floor [place] Cardiac cath lab in the fasting state. The patient was premedicated with Versed 2 mg and fentanyl 50 mcg. A right radial approach was utilized after an Allen's test verified adequate circulation. The right radial artery was punctured via the Seldinger technique, and a 6 [place] Glidesheath Slender was inserted without difficulty.  A radial cocktail consisting of Verapamil, IV nitroglycerin, and lidocaine was administered. Weight adjusted heparin was administered. A safety J wire was advanced into the ascending aorta. Diagnostic catheterization was done with a 5 [place] TIG 4.0 catheter. A 5 French pigtail catheter was used for left ventriculography. A TR radial band was applied for hemostasis. The patient left the catheterization laboratory in stable condition.   HEMODYNAMICS:   Central Aorta: 140/65  Left Ventricle: 140/16/23  ANGIOGRAPHY:   Fluoroscopy revealed coronary calcification of left  coronary system and RCA.  The left main coronary artery was a moderate-sized long vessel which  wasangiographically normal and bifurcated into the LAD and left circumflex coronary artery.   The LAD had proximal calcification.  There was smooth proximal narrowing before the first septal perforating artery.  There was 30% narrowing proximal to the takeoff of the first agonal vessel with 50-60% narrowing in the proximal portion of this diagonal and 30% narrowing in the LAD beyond the diagonal vessel.  The left circumflex coronary artery was a moderate size vessel that had smooth 20% proximal narrowing prior to giving off one major bifurcating marginal branch.  The RCA was large caliber vessel.  There was a patent stent in the proximal RCA and a patent stent in the mid RCA.  The remainder of the RCA was free of significant disease.  The vessel supplied a moderate size PDA and smaller PLA vessel.  Left ventriculography revealed normal global LV contractility with mild mid inferior hypocontractility. There was no evidence for mitral regurgitation.  Ejection fraction is 55-60%.   IMPRESSION:  Normal global LV function with mild mid inferior hypocontractility.  Coronary calcification with smooth 20% proximal narrowing followed by 30% stenosis before the first diagonal branch, 50-60% stenosis in the proximal diagonal 1 vessel with 30% mid LAD smooth narrowing; 20% smooth stenosis in the proximal left circumflex: Artery, and widely patent proximal and mid RCA stents without significant RCA stenoses.   RECOMMENDATION:  Medical therapy.   Troy Sine, MD, [facility] 06/29/2014 2:14 PM

## 2014-06-29 NOTE — Evaluation (Signed)
Physical Therapy Evaluation Patient Details Name: [name] MRN: [id number] DOB: Mar 11, 1935 Today's Date: 06/29/2014   History of Present Illness  80 y.o. female morbidly obese, with hx of sleep apnea CPAP non compliant, COPD, known CAD s/p 2 stent placement several years ago, hx of chronic diastolic CHF, hx of DM/IDDM on high doses of insulin, HTN, HLD, lower extremity DVT on Coumadin, Admitted with dyspnea on exertion, chest pain, and possible acute on chronic CHF.  Clinical Impression  Pt admitted with the above complications. Pt currently with functional limitations due to the deficits listed below (see PT Problem List). 4/4 dyspnea on exertion of only 20 feet of ambulation with min assist for stability. HR to 105, SpO2 96% on room air. Patient states she uses a wheelchair for any distances of mobility outside of the house and requires assist for ADLs at baseline. She feels the only functional difference she notices at this time is her difficulty with breathing. She does have 24 hour care at home, however if pt demonstrates further decline in functional abilities, she may requires short term SNF. Will continue to assess. Pt will benefit from skilled PT to increase their independence and safety with mobility to allow discharge to the venue listed below.       Follow Up Recommendations Home health PT;Supervision for mobility/OOB    Equipment Recommendations  None recommended by PT    Recommendations for Other Services       Precautions / Restrictions Precautions Precautions: Fall Restrictions Weight Bearing Restrictions: No      Mobility  Bed Mobility Overal bed mobility: Modified Independent             General bed mobility comments: Heavy use of rail, requires extra time.  Transfers Overall transfer level: Needs assistance Equipment used: Rolling walker (2 wheeled) Transfers: Sit to/from Stand Sit to Stand: Min assist;From elevated surface         General  transfer comment: Min assist for boost to stand from elevated bed surface. Cues for hand placement.  Ambulation/Gait Ambulation/Gait assistance: Min assist Ambulation Distance (Feet): 20 Feet Assistive device: Rolling walker (2 wheeled) Gait Pattern/deviations: Step-through pattern;Decreased stride length;Staggering left;Trunk flexed Gait velocity: decreased   General Gait Details: Educated on safe DME use with a rolling walker. Frequent cues to place RW closer to base of support. Min guard intially but required min assist as she fatigued for walker control with one instance of staggering towards her left. 4/4 dyspnea with SpO2 96% on room air and HR 105.  Stairs            Wheelchair Mobility    Modified Rankin (Stroke Patients Only)       Balance Overall balance assessment: Needs assistance Sitting-balance support: No upper extremity supported;Feet supported Sitting balance-Leahy Scale: Good     Standing balance support: Bilateral upper extremity supported Standing balance-Leahy Scale: Poor                               Pertinent Vitals/Pain Pain Assessment: No/denies pain    Home Living Family/patient expects to be discharged to:: Private residence Living Arrangements: Children Available Help at Discharge: Family;Available 24 hours/day Type of Home: House Home Access: Level entry     Home Layout: Two level;Able to live on main level with bedroom/bathroom Home Equipment: Shower seat;Walker - 2 wheels;Wheelchair - manual;Bedside commode      Prior Function Level of Independence: Needs assistance  Gait / Transfers Assistance Needed: walker for very short distance ambulation within house. Outside of house uses W/c.  ADL's / Homemaking Assistance Needed: Needs assist with bath/dress.        Hand Dominance   Dominant Hand: Right    Extremity/Trunk Assessment   Upper Extremity Assessment: Defer to OT evaluation           Lower  Extremity Assessment: Generalized weakness         Communication   Communication: No difficulties  Cognition Arousal/Alertness: Awake/alert Behavior During Therapy: WFL for tasks assessed/performed Overall Cognitive Status: Within Functional Limits for tasks assessed                      General Comments General comments (skin integrity, edema, etc.): SpO2 97% at rest. HR 76. -- Ambulating HR 80s, SpO2 96%    Exercises        Assessment/Plan    PT Assessment Patient needs continued PT services  PT Diagnosis Difficulty walking;Abnormality of gait;Generalized weakness   PT Problem List Decreased strength;Decreased range of motion;Decreased activity tolerance;Decreased balance;Decreased mobility;Decreased knowledge of use of DME;Cardiopulmonary status limiting activity;Obesity  PT Treatment Interventions DME instruction;Gait training;Functional mobility training;Therapeutic activities;Balance training;Therapeutic exercise;Neuromuscular re-education;Patient/family education   PT Goals (Current goals can be found in the Care Plan section) Acute Rehab PT Goals Patient Stated Goal: Go home PT Goal Formulation: With patient Time For Goal Achievement: 07/13/14 Potential to Achieve Goals: Fair    Frequency Min 3X/week   Barriers to discharge        Co-evaluation               End of Session Equipment Utilized During Treatment: Gait belt Activity Tolerance: Patient limited by fatigue Patient left: in bed;with call bell/phone within reach;with family/visitor present;with nursing/sitter in room           Time: [id number] PT Time Calculation (min) (ACUTE ONLY): 19 min   Charges:   PT Evaluation $Initial PT Evaluation Tier I: 1 Procedure     PT G CodesEllouise Newer 06/29/2014, 1:16 PM Elayne Snare, Shorter

## 2014-06-29 NOTE — Progress Notes (Addendum)
ANTICOAGULATION CONSULT NOTE - Follow Up Consult  Pharmacy Consult for Heparin Indication: hx DVT and r/o ACS  Allergies  Allergen Reactions  . Sulfonamide Derivatives Swelling    To mouth    Patient Measurements: Height: 5\' 7"  (170.2 cm) Weight: (!) 371 lb (168.284 kg) IBW/kg (Calculated) : 61.6 Heparin Dosing Weight: 104kg  Vital Signs: Temp: 98.5 F (36.9 C) (02/22 0409) Temp Source: Oral (02/22 0409) BP: 156/63 mmHg (02/22 0409) Pulse Rate: 76 (02/22 0409)  Labs:  Recent Labs  06/27/14 0440 06/27/14 1208 06/27/14 2150 06/28/14 0347 06/28/14 0800 06/28/14 1958 06/29/14 0430  HGB 9.2*  --   --  9.3*  --   --  8.9*  HCT 29.6*  --   --  30.6*  --   --  28.4*  PLT 263  --   --  270  --   --  248  LABPROT 21.5*  --   --  24.0*  --  21.4* 20.4*  INR 1.84*  --   --  2.13*  --  1.84* 1.73*  HEPARINUNFRC 0.23* 0.17* 0.23*  --  0.54  --  0.67  CREATININE  --  1.44*  --  1.26*  --   --  1.15*    Estimated Creatinine Clearance: 67.5 mL/min (by C-G formula based on Cr of 1.15).   Medications:  Heparin @ 2100 units/hr  Assessment: [name] on coumadin pta for hx DVT, admitted with dyspnea on exertion ? related to cardiac ischemia. Coumadin placed on hold and she was started on heparin (last coumadin dose 2/19). She underwent a 2 day stress test which was high risk with evidence of inferior and anteroapical ischemia so plan is for cath today. Heparin level is therapeutic. INR is 1.73 and has been reversed with vitamin k for cath (5mg  total on 2/21 and 2.5mg  today). CBC stable. No bleeding reported.  Goal of Therapy:  Heparin level 0.3-0.7 units/ml  INR 2-3 Monitor platelets by anticoagulation protocol: Yes   Plan:  1) Continue heparin at 2100 units/hr 2) Follow up after cath 3) Follow up resuming coumadin  Deboraha Sprang 06/29/2014,8:12 AM  Addendum:  Patient is now s/p cath with plan for medical management. Coumadin to resume tonight along with heparin  bridge until INR is therapeutic. Anticipate that she may require higher coumadin doses to overcome vitamin k resistance. Home coumadin dose 5mg  daily except 7.5mg  Tue/Thur/Sat.   Heparin to begin 10 hours post-sheath removal. Sheath removed at 1357 and TR band applied. Heparin was previously therapeutic on 2100 units/hr, however, heparin level was trending up so will resume at a slightly lower rate.  Plan: 1) At ~0000, resume heparin at 2000 units/hr with NO bolus 2) Check 8 hour heparin level 3) Coumadin 7.5mg  x 1 4) Daily INR  Deboraha Sprang 06/29/2014, 2:38 PM

## 2014-06-29 NOTE — H&P (View-Only) (Signed)
SUBJECTIVE:  Denies chest pain or SOB  OBJECTIVE:   Vitals:   Filed Vitals:   06/27/14 1153 06/27/14 1336 06/27/14 2045 06/28/14 0403  BP: 142/72 122/81 151/65 141/68  Pulse: 78 77 72 77  Temp:  98.5 F (36.9 C) 97.9 F (36.6 C) 98.2 F (36.8 C)  TempSrc:  Oral Oral Oral  Resp:  18 18 20   Height:      Weight:      SpO2:  97% 96% 97%   I&O's:   Intake/Output Summary (Last 24 hours) at 06/28/14 0758 Last data filed at 06/28/14 0404  Gross per 24 hour  Intake    510 ml  Output    725 ml  Net   -215 ml   TELEMETRY: Reviewed telemetry pt in NSR:     PHYSICAL EXAM General: Well developed, well nourished, in no acute distress  Lungs:   Clear bilaterally to auscultation and percussion. Heart:   HRRR S1 S2 Pulses are 2+ & equal. Abdomen: Bowel sounds are positive, abdomen soft and non-tender without masses Extremities:   No clubbing, cyanosis or edema.  DP +1 Neuro: Alert and oriented X 3. Psych:  Good affect, responds appropriately   LABS: Basic Metabolic Panel:  Recent Labs  06/27/14 1208 06/28/14 0347  NA 137 137  K 4.3 4.4  CL 100 105  CO2 25 28  GLUCOSE 151* 186*  BUN 17 18  CREATININE 1.44* 1.26*  CALCIUM 8.8 8.3*   Liver Function Tests: No results for input(s): AST, ALT, ALKPHOS, BILITOT, PROT, ALBUMIN in the last 72 hours. No results for input(s): LIPASE, AMYLASE in the last 72 hours. CBC:  Recent Labs  06/27/14 0440 06/28/14 0347  WBC 10.6* 10.2  HGB 9.2* 9.3*  HCT 29.6* 30.6*  MCV 86.8 87.4  PLT 263 270   Cardiac Enzymes:  Recent Labs  06/25/14 1410  TROPONINI <0.03   BNP: Invalid input(s): POCBNP D-Dimer: No results for input(s): DDIMER in the last 72 hours. Hemoglobin A1C: No results for input(s): HGBA1C in the last 72 hours. Fasting Lipid Panel: No results for input(s): CHOL, HDL, LDLCALC, TRIG, CHOLHDL, LDLDIRECT in the last 72 hours. Thyroid Function Tests: No results for input(s): TSH, T4TOTAL, T3FREE, THYROIDAB in the  last 72 hours.  Invalid input(s): FREET3 Anemia Panel: No results for input(s): VITAMINB12, FOLATE, FERRITIN, TIBC, IRON, RETICCTPCT in the last 72 hours. Coag Panel:   Lab Results  Component Value Date   INR 2.13* 06/28/2014   INR 1.84* 06/27/2014   INR 1.66* 06/26/2014    RADIOLOGY: Dg Chest 2 View  06/24/2014   CLINICAL DATA:  Shortness of breath for 2 weeks, LEFT arm pain radiating to LEFT chest, history coronary artery disease post MI and coronary stenting, hypertension, type 2 diabetes, CHF, hyperlipidemia, COPD  EXAM: CHEST  2 VIEW  COMPARISON:  04/21/2014  FINDINGS: Upper normal heart size.  Normal mediastinal contours.  Atherosclerotic calcification at aortic arch.  Minimal vascular congestion.  Decreased lung volumes since previous exam with mild bibasilar atelectasis.  Upper lungs clear.  No pleural effusion or pneumothorax.  Scattered endplate spur formation thoracic spine with generalized osseous demineralization.  IMPRESSION: Bibasilar atelectasis.   Electronically Signed   By: Lavonia [name] M.D.   On: 06/24/2014 19:35   Nm Myocar Multi W/spect W/wall Motion / Ef  06/26/2014   CLINICAL DATA:  This is a 80 y.o. female with a past medical history significant for CAD with past inferior MI in 2004. She has  had bare-metal stent to the RCA in 2004 and drug-eluting stent placement to the RCA in 06/2010 in the setting of unstable angina with overall preserved LV function. Last cath in 2012 showing otherwise nonobstructive disease. She presents with dyspnea and new ECG changes  EXAM: MYOCARDIAL IMAGING WITH SPECT (REST AND PHARMACOLOGIC-STRESS - 2 DAY PROTOCOL)  GATED LEFT VENTRICULAR WALL MOTION STUDY  LEFT VENTRICULAR EJECTION FRACTION  TECHNIQUE: Standard myocardial SPECT imaging was performed after resting intravenous injection of 10 mCi Tc-7m sestamibi. Subsequently, on a second day, intravenous infusion of Lexiscan was performed under the supervision of the Cardiology staff. At peak effect  of the drug, 30 mCi Tc-46m sestamibi was injected intravenously and standard myocardial SPECT imaging was performed. Quantitative gated imaging was also performed to evaluate left ventricular wall motion, and estimate left ventricular ejection fraction.  COMPARISON:  None.  FINDINGS: Perfusion: There is a moderate size, moderate severity perfusion defect involving most of the inferior wall and a small mild anteroapical perfusion defect on the stress images. Both defects improve on the resting images.  Wall Motion: Borderline low left ventricular systolic function with mild inferior hypokinesis . No left ventricular dilation.  Left Ventricular Ejection Fraction: 51 %  End diastolic volume 123XX123 ml  End systolic volume 62 ml  IMPRESSION: 1.  Moderate inferior ischemia and mild anteroapical ischemia.  2.  Inferior wall hypokinesis.  3. Left ventricular ejection fraction 51%  4. High-risk stress test findings*.  Suggestive of multivessel CAD.  *2012 Appropriate Use Criteria for Coronary Revascularization Focused Update: J Am Coll Cardiol. [id number]. http://content.airportbarriers.com.aspx?articleid=1201161   Electronically Signed   By: Sanda Klein   On: 06/26/2014 16:57    ASSESSMENT AND PLAN  1. Exertional dyspnea/CAD: R/o for MI. Prior h/o RCA stenting in 2012, presenting with progressive DOE. Cardioilte was high risk with evidence of inferior and anteroapical ischemia. NL LV. Cont asa, statin, bb, nitrate. Lasix on hold for cath.  Plan cath on Monday.   2. HTN: Stable on bb/nitrate.  3. HL: Cont statin.  4. DM: Per IM.  5. CKD III: Creatinine improved withholding lasix and IVF hydration.  6. H/O PE on chronic coumadin: INR 2.13 today - ? Lab error since coumadin has been on hold for several days.  Will recheck and if still above 2 then give low dose vitamin K. Continue to bridge with heparin.  7. Chronic diastolic chf: Volume difficult to judge but appears to be  stable.   Sueanne Margarita, MD  06/28/2014  7:58 AM

## 2014-06-29 NOTE — Progress Notes (Signed)
PROGRESS NOTE    [name] [id number] DOB: 08-26-1934 DOA: 06/24/2014 PCP: Blanchie Serve, MD  HPI/Brief narrative 80 y.o. female morbidly obese, with hx of sleep apnea CPAP non compliant, COPD, known CAD s/p 2 stent placement several years ago, hx of chronic diastolic CHF, hx of DM/IDDM on high doses of insulin, HTN, HLD, lower extremity DVT on Coumadin, presents to the ER with increase DOE for the past 2 weeks. She has no orthopnea or PND, and denied exertional CP. There has been no coughs, fever, chills, but she has audible wheezing. Evaluation in the ER showed EKG with NSR and no acute ST T changes, negative initial troponin, and negative D Dimer. Her INR was 1.92, BNP of only 62, and her CXR showed only minimal vascular congestion. Hospitalist was asked to admit her for further evaluation, with concern for cardiac ischemia. She gives a long history of intermittent left arm cramping associated with substernal cramping-without without exertion, relieved by sublingual NTG. She apparently gets these episodes once every couple of months. Stress test 5 years ago apparently negative. States they were unable to do cardiac cath secondary to renal insufficiency. Gives history of productive cough 3 weeks ago-improved after OTC medications. Cardiology consulted.  Assessment/Plan:   Dyspnea on exertion:  -Probably multifactorial related to morbid obesity, OSA/possible OHS, basal atelectasis, ? Acute on chronic diastolic CHF and residual acute bronchitis.  -Chest x-ray: Bibasal atelectasis without acute findings. Troponin 3 negative. D-dimer negative. Advised compliance with nightly CPAP.  -Cardiology input appreciated. 2-D echo: LVEF 60-65% and grade 1 diastolic dysfunction. No personal history of smoking or passive inhalation: Not sure if she truly has COPD.   Chest pain/History of CAD status post stents -? Angina. The patient has several risk factors for CAD. Cardiology input appreciated.   -No chest pain since admission.  -Troponin 3 negative.  - R/o for MI. Prior h/o RCA stenting in 2012, presenting with progressive DOE. Cardioilte 2/19 was high risk with evidence of inferior and anteroapical ischemia. NL LV. Cont asa, statin, bb, nitrate. Hold lasix over the weekend and hydrate on Sunday. Plan cath on Monday - Continue metoprolol and nitrates. Add aspirin. Management per cardiology. - Plan is for cardiac catheter  2/22, patient INR is 1.7, she was given vitamin K 2.5 mg oral 2 yesterday , as well she was given another dose of oral vitamin K 2.5 mg once after a results of INR 1.7 in anticipation of cardiac cath.( Total of 7.5 mg oral)  Possible mild acute on chronic diastolic CHF: - Hold Lasix as planned for cardiac cath on Monday.  OSA:  -Noncompliant with C Pap at home due to discomfort. C Pap daily at bedtime in hospital. Outpatient follow-up with pulmonology.  Uncontrolled DM 2 with renal complications: - Continue home dose of long-acting insulin and reduced dose of lispro. Added sensitive SSI without bedtime NovoLog.  Hypoglycemia this a.m., going to be nothing by mouth after night for cardiac cath, will decrease Lantus from 120 net at bedtime to 80 units at bedtime only for tonight. -  A1c: 9.9 suggesting poor outpatient control.  Essential hypertension:  -Continue metoprolol and nitrates.  History of dyslipidemia:  -Continue statins  Anemia:  -Likely related to chronic kidney disease. Stable.  Stage III chronic kidney disease:  -Creatinine may be close to baseline.   History of lower extremity DVT:  -Last episode 2 years ago. Continue with heparin drip as on for cardiac cath on Monday.     Code  Status: Full Family Communication: None at bedside Disposition Plan: Cardiac cath on Monday.   Consultants:  Cardiology  Procedures:  Nuclear stress test 2/19  Antibiotics:  None   Subjective: Currently denies dyspnea or chest pain.    Objective: Filed Vitals:   06/28/14 1054 06/28/14 1442 06/28/14 2117 06/29/14 0409  BP: 125/63 131/54 144/61 156/63  Pulse: 93 74 83 76  Temp:  97.9 F (36.6 C) 98.2 F (36.8 C) 98.5 F (36.9 C)  TempSrc:  Oral Oral Oral  Resp:  18 18 16   Height:      Weight:      SpO2:  97% 99% 95%    Intake/Output Summary (Last 24 hours) at 06/29/14 1007 Last data filed at 06/29/14 0800  Gross per 24 hour  Intake    480 ml  Output    250 ml  Net    230 ml   Filed Weights   06/25/14 1348  Weight: 168.284 kg (371 lb)     Exam:  General exam: Moderately built and morbidly obese female patient lying comfortably supine in bed  Respiratory system: clear to auscultation . No increased work of breathing. Cardiovascular system: S1 & S2 heard, RRR. No JVD, murmurs, gallops, clicks or pedal edema. Telemetry: Sinus rhythm. Gastrointestinal system: Abdomen is nondistended, soft and nontender. Normal bowel sounds heard. Central nervous system: Alert and oriented. No focal neurological deficits. Extremities: Symmetric 5 x 5 power.   Data Reviewed: Basic Metabolic Panel:  Recent Labs Lab 06/24/14 1821 06/26/14 0605 06/27/14 1208 06/28/14 0347 06/29/14 0430  NA 134* 137 137 137 137  K 5.1 4.3 4.3 4.4 4.4  CL 101 103 100 105 109  CO2 25 30 25 28 25   GLUCOSE 272* 210* 151* 186* 163*  BUN 23 21 17 18 15   CREATININE 1.61* 1.50* 1.44* 1.26* 1.15*  CALCIUM 8.6 8.2* 8.8 8.3* 8.0*   Liver Function Tests: No results for input(s): AST, ALT, ALKPHOS, BILITOT, PROT, ALBUMIN in the last 168 hours. No results for input(s): LIPASE, AMYLASE in the last 168 hours. No results for input(s): AMMONIA in the last 168 hours. CBC:  Recent Labs Lab 06/24/14 1821 06/26/14 0605 06/27/14 0440 06/28/14 0347 06/29/14 0430  WBC 11.0* 10.2 10.6* 10.2 10.2  HGB 10.6* 9.5* 9.2* 9.3* 8.9*  HCT 33.7* 30.5* 29.6* 30.6* 28.4*  MCV 87.1 87.1 86.8 87.4 87.9  PLT 280 274 263 270 248   Cardiac  Enzymes:  Recent Labs Lab 06/25/14 0155 06/25/14 0748 06/25/14 1410  TROPONINI <0.03 <0.03 <0.03   BNP (last 3 results)  Recent Labs  10/14/13 1644  PROBNP 150.2   CBG:  Recent Labs Lab 06/28/14 0950 06/28/14 1115 06/28/14 1606 06/28/14 2115 06/29/14 0627  GLUCAP 90 205* 335* 290* 135*    No results found for this or any previous visit (from the past 240 hour(s)).         Studies: No results found.      Scheduled Meds: . aspirin EC  81 mg Oral Daily  . colchicine  0.6 mg Oral Daily  . DULoxetine  60 mg Oral Daily  . insulin aspart  0-9 Units Subcutaneous TID WC  . insulin aspart  35 Units Subcutaneous BID WC  . insulin glargine  80 Units Subcutaneous QHS  . isosorbide mononitrate  60 mg Oral Daily  . Linaclotide  145 mcg Oral Daily  . metoprolol succinate  25 mg Oral Daily  . pantoprazole  40 mg Oral Daily  . pravastatin  20 mg Oral q1800  . sodium chloride  3 mL Intravenous Q12H  . sodium chloride  3 mL Intravenous Q12H   Continuous Infusions: . sodium chloride    . sodium chloride 75 mL/hr at 06/28/14 1755  . heparin 2,100 Units/hr (06/28/14 1910)    Principal Problem:   DOE (dyspnea on exertion) Active Problems:   Hyperlipidemia   Essential hypertension   CAD (coronary artery disease)   Pulmonary embolus   Chronic diastolic congestive heart failure   DM type 2 causing CKD stage 3   Long term current use of anticoagulant therapy   Debility   OSA (obstructive sleep apnea)   COPD (chronic obstructive pulmonary disease)   Abnormal stress test    Time spent: 25 minutes.    Phillips Climes, MD. [facility] Pager [phone]  If 7PM-7AM, please contact night-coverage [email or web address] Password [email or web address] 06/29/2014, 10:07 AM    LOS: 4 days

## 2014-06-30 ENCOUNTER — Encounter: Payer: Self-pay | Admitting: Internal Medicine

## 2014-06-30 LAB — CBC
HCT: 29.4 % — ABNORMAL LOW (ref 36.0–46.0)
Hemoglobin: 9.1 g/dL — ABNORMAL LOW (ref 12.0–15.0)
MCH: 27.7 pg (ref 26.0–34.0)
MCHC: 31 g/dL (ref 30.0–36.0)
MCV: 89.6 fL (ref 78.0–100.0)
Platelets: 253 10*3/uL (ref 150–400)
RBC: 3.28 MIL/uL — ABNORMAL LOW (ref 3.87–5.11)
RDW: 15.8 % — ABNORMAL HIGH (ref 11.5–15.5)
WBC: 9.3 10*3/uL (ref 4.0–10.5)

## 2014-06-30 LAB — BASIC METABOLIC PANEL
Anion gap: 3 — ABNORMAL LOW (ref 5–15)
BUN: 10 mg/dL (ref 6–23)
CO2: 25 mmol/L (ref 19–32)
Calcium: 8.2 mg/dL — ABNORMAL LOW (ref 8.4–10.5)
Chloride: 110 mmol/L (ref 96–112)
Creatinine, Ser: 1.09 mg/dL (ref 0.50–1.10)
GFR calc Af Amer: 55 mL/min — ABNORMAL LOW (ref >90)
GFR calc non Af Amer: 48 mL/min — ABNORMAL LOW (ref >90)
Glucose, Bld: 138 mg/dL — ABNORMAL HIGH (ref 70–99)
Potassium: 4.5 mmol/L (ref 3.5–5.1)
Sodium: 138 mmol/L (ref 135–145)

## 2014-06-30 LAB — GLUCOSE, CAPILLARY
Glucose-Capillary: 125 mg/dL — ABNORMAL HIGH (ref 70–99)
Glucose-Capillary: 152 mg/dL — ABNORMAL HIGH (ref 70–99)
Glucose-Capillary: 132 mg/dL — ABNORMAL HIGH (ref 70–99)

## 2014-06-30 LAB — PROTIME-INR
INR: 1.27 (ref 0.00–1.49)
Prothrombin Time: 16.1 seconds — ABNORMAL HIGH (ref 11.6–15.2)

## 2014-06-30 LAB — HEPARIN LEVEL (UNFRACTIONATED): Heparin Unfractionated: 0.5 IU/mL (ref 0.30–0.70)

## 2014-06-30 MED ORDER — INSULIN LISPRO 200 UNIT/ML ~~LOC~~ SOPN: 25.0000 [IU] | PEN_INJECTOR | Freq: Two times a day (BID) | SUBCUTANEOUS | Status: DC

## 2014-06-30 MED ORDER — INSULIN ASPART 100 UNIT/ML ~~LOC~~ SOLN
25.0000 [IU] | Freq: Two times a day (BID) | SUBCUTANEOUS | Status: DC
  Administered 2014-06-30: 25 [IU] via SUBCUTANEOUS

## 2014-06-30 MED ORDER — WARFARIN SODIUM 10 MG PO TABS
10.0000 mg | ORAL_TABLET | Freq: Every day | ORAL | Status: DC
  Filled 2014-06-30: qty 1

## 2014-06-30 MED ORDER — INSULIN GLARGINE 100 UNIT/ML ~~LOC~~ SOLN
100.0000 [IU] | Freq: Every day | SUBCUTANEOUS | Status: DC
  Filled 2014-06-30: qty 1

## 2014-06-30 MED ORDER — WARFARIN SODIUM 5 MG PO TABS: 10.0000 mg | ORAL_TABLET | Freq: Every day | ORAL | Status: DC

## 2014-06-30 MED ORDER — INSULIN GLARGINE 300 UNIT/ML ~~LOC~~ SOPN: 100.0000 [IU] | PEN_INJECTOR | Freq: Every day | SUBCUTANEOUS | Status: DC

## 2014-06-30 MED ORDER — ASPIRIN 81 MG PO TBEC: 81.0000 mg | DELAYED_RELEASE_TABLET | Freq: Every day | ORAL | Status: DC

## 2014-06-30 NOTE — Progress Notes (Signed)
Physical Therapy Treatment Patient Details Name: [name] MRN: [id number] DOB: 1935-03-31 Today's Date: 06/30/2014    History of Present Illness 80 y.o. female morbidly obese, with hx of sleep apnea CPAP non compliant, COPD, known CAD s/p 2 stent placement several years ago, hx of chronic diastolic CHF, hx of DM/IDDM on high doses of insulin, HTN, HLD, lower extremity DVT on Coumadin, Admitted with dyspnea on exertion, chest pain, and possible acute on chronic CHF.    PT Comments    Progressing towards PT goals, ambulatory distance today at 45 feet before onset of SOB. Complained of chest tightness which resolved within 3 minutes after sitting. HR in 70s. RN notified. Min-mod assist for transfers depending on height of surface. Pt states she has all needed equipment at home and will have 24 hour care. Agrees she would benefit form HHPT after explaining need and benefits of continued therapy for safe mobility and progression of endurance/weight loss.  Follow Up Recommendations  Home health PT;Supervision for mobility/OOB     Equipment Recommendations  None recommended by PT    Recommendations for Other Services       Precautions / Restrictions Precautions Precautions: Fall Restrictions Weight Bearing Restrictions: No    Mobility  Bed Mobility Overal bed mobility: Modified Independent             General bed mobility comments: Heavy use of rail, requires extra time.  Transfers Overall transfer level: Needs assistance Equipment used: Rolling walker (2 wheeled) Transfers: Sit to/from [facility] Sit to Stand: From elevated surface;Mod assist Stand pivot transfers: Min guard       General transfer comment: Min assist for boost to stand from elevated bed surface. Cues for hand placement. Mod assist for boost from reclining chair with max cues for technique. Min guard for pivot to bed from reclining chair. demonstrates good control of RW with this  task.  Ambulation/Gait Ambulation/Gait assistance: Min guard;+2 safety/equipment Ambulation Distance (Feet): 45 Feet Assistive device: Rolling walker (2 wheeled) Gait Pattern/deviations: Step-through pattern;Decreased stride length;Wide base of support;Trunk flexed Gait velocity: decreased   General Gait Details: VC for upright posture and walker placement for proximity. 2nd person followed with chair. 4/4 dyspnea at end of distance, resolved within several minutes, HR 70 and pt complained of chest tightness which resolved with improved breathing.  No loss of balance today, distance limited by fatigue. RN notified.   Stairs            Wheelchair Mobility    Modified Rankin (Stroke Patients Only)       Balance                                    Cognition Arousal/Alertness: Awake/alert Behavior During Therapy: WFL for tasks assessed/performed Overall Cognitive Status: Within Functional Limits for tasks assessed                      Exercises      General Comments General comments (skin integrity, edema, etc.): Discussed home exercises with patient and she verbalizes that she already performs many exercises at home in seated and supine position which she learned from previous therapies.      Pertinent Vitals/Pain Pain Assessment: No/denies pain    Home Living                      Prior Function  PT Goals (current goals can now be found in the care plan section) Acute Rehab PT Goals Patient Stated Goal: Go home PT Goal Formulation: With patient Time For Goal Achievement: 07/13/14 Potential to Achieve Goals: Fair Progress towards PT goals: Progressing toward goals    Frequency  Min 3X/week    PT Plan Current plan remains appropriate    Co-evaluation             End of Session Equipment Utilized During Treatment: Gait belt Activity Tolerance: Patient limited by fatigue Patient left: in bed;with call  bell/phone within reach;with family/visitor present     Time: 1347-1415 PT Time Calculation (min) (ACUTE ONLY): 28 min  Charges:  $Gait Training: 8-22 mins $Therapeutic Activity: 8-22 mins                    G Codes:      Ellouise Newer July 04, 2014, 2:41 PM Camille Bal [place], [place]

## 2014-06-30 NOTE — Discharge Summary (Signed)
[name], 80 y.o., DOB [birth year], MRN [id number]. Admission date: 06/24/2014 Discharge Date 06/30/2014 Primary MD Blanchie Serve, MD Admitting Physician Orvan Falconer, MD   PCP please follow on: - Check CBC, BMP during next visit - Patient will follow with warfarin clinic this Friday, regarding her warfarin dose, warfarin has been increased on discharge to 10 mg oral daily, till she is seen by warfarin clinic, INR at day of discharge is 1.27.  Admission Diagnosis  CAD (coronary artery disease) [I25.10] Exertional dyspnea 123XX123 Acute diastolic congestive heart failure [I50.31]  Discharge Diagnosis   Principal Problem:   DOE (dyspnea on exertion) Active Problems:   Hyperlipidemia   Essential hypertension   CAD (coronary artery disease)   Pulmonary embolus   Chronic diastolic congestive heart failure   DM type 2 causing CKD stage 3   Long term current use of anticoagulant therapy   Debility   OSA (obstructive sleep apnea)   COPD (chronic obstructive pulmonary disease)   Abnormal stress test   Past Medical History  Diagnosis Date  . COPD (chronic obstructive pulmonary disease)     patient denies this  . Diabetes mellitus   . Diverticular disease   . Hypertension   . Hyperlipidemia   . Coronary artery disease     a. s/p IMI 2004 tx with BMS to RCA;  b. s/p Promus DES to RCA 2/12 (cath: LM 20-30%, pLAD 20-30%, mLAD 50%, RI 30%, pRCA 60%, mRCA 90% - tx with PCI);  c. myoview 1/07: EF 49%, inf scar, no isch  . Urinary incontinence   . Insomnia   . Osteoarthritis   . Morbid obesity   . Obstructive sleep apnea   . Polymyalgia rheumatica   . Anxiety   . Arthritis   . Depression   . Cholelithiasis   . CHF (congestive heart failure)   . GERD (gastroesophageal reflux disease)   . Pulmonary emboli 9/13    felt to need lifelong anticoagulation  . Debility   . Unspecified constipation   . Allergy   . Gout, unspecified   . Dysuria   . Urinary complications   . Pain,  chronic   . Allergic rhinitis due to pollen   . Osteoarthrosis, unspecified whether generalized or localized, lower leg   . Type II or unspecified type diabetes mellitus without mention of complication, uncontrolled   . Anemia, unspecified   . Coronary atherosclerosis of native coronary artery   . Other malaise and fatigue     Past Surgical History  Procedure Laterality Date  . Coronary artery bypass graft    . Appendectomy    . Cardiac catheterization    . Gastric bypass  1977     reversed in 1979, [facility]  . Blood clots/legs and lungs  2013  . Mi with stent placement  2004  . Left heart catheterization with coronary angiogram N/A 06/29/2014    Procedure: LEFT HEART CATHETERIZATION WITH CORONARY ANGIOGRAM;  Surgeon: Troy Sine, MD;  Location: [facility] CATH LAB;  Service: Cardiovascular;  Laterality: N/A;     Hospital Course See H&P, Labs, Consult and Test reports for all details in brief, patient was admitted for **  Principal Problem:   DOE (dyspnea on exertion) Active Problems:   Hyperlipidemia   Essential hypertension   CAD (coronary artery disease)   Pulmonary embolus   Chronic diastolic congestive heart failure   DM type 2 causing CKD stage 3   Long term current use of anticoagulant therapy  Debility   OSA (obstructive sleep apnea)   COPD (chronic obstructive pulmonary disease)   Abnormal stress test   80 y.o. female morbidly obese, with hx of sleep apnea CPAP non compliant, COPD, known CAD s/p 2 stent placement several years ago, hx of chronic diastolic CHF, hx of DM/IDDM on high doses of insulin, HTN, HLD, lower extremity DVT on Coumadin, presents to the ER with increase DOE for the past 2 weeks. She has no orthopnea or PND, and denied exertional CP. There has been no coughs, fever, chills, but she has audible wheezing. Evaluation in the ER showed EKG with NSR and no acute ST T changes, negative initial troponin, and negative D Dimer. Her INR was 1.92, BNP of  only 62, and her CXR showed only minimal vascular congestion. Hospitalist was asked to admit her for further evaluation, with concern for cardiac ischemia. She gives a long history of intermittent left arm cramping associated with substernal cramping-without without exertion, relieved by sublingual NTG. She apparently gets these episodes once every couple of months. Stress test 5 years ago apparently negative. States they were unable to do cardiac cath secondary to renal insufficiency. Gives history of productive cough 3 weeks ago-improved after OTC medications. Cardiology consulted.  Dyspnea on exertion:  -Probably multifactorial related to morbid obesity, OSA/possible OHS, basal atelectasis, ? Acute on chronic diastolic CHF and residual acute bronchitis.  -Chest x-ray: Bibasal atelectasis without acute findings. Troponin 3 negative. D-dimer negative. Advised compliance with nightly CPAP. -Cardiology input appreciated. 2-D echo: LVEF 60-65% and grade 1 diastolic dysfunction. No personal history of smoking or passive inhalation: Not sure if she truly has COPD.   Chest pain/History of CAD status post stents - The patient has several risk factors for CAD.  -No chest pain during hospital stay -Troponin 3 negative.  - Admitted to R/o for MI. Prior h/o RCA stenting in 2012, presenting with progressive DOE. Cardioilte 2/19 was high risk with evidence of inferior and anteroapical ischemia. NL LV. - Continue metoprolol and nitrates. Add aspirin. Management per cardiology - Cardiac cath 2/22. Significant for minimally coronary artery disease, to continue with medical management, continue aspirin, statin, beta blockers.   Possible mild acute on chronic diastolic CHF: - Resumed Lasix on discharge  OSA:  -Noncompliant with C Pap at home due to discomfort. C Pap daily at bedtime in hospital. Outpatient follow-up with pulmonology.  Uncontrolled DM 2 with renal complications: - Had an episode of  hypoglycemia during hospital stay, will decrease Lantus to 100 unit subcutaneous daily, and insulin lispro to 25 units subcutaneous twice a day - A1c: 9.9 suggesting poor outpatient control.  Essential hypertension:  -Continue metoprolol and nitrates.  History of dyslipidemia:  -Continue statins  Anemia:  -Likely related to chronic kidney disease. Stable.  Stage III chronic kidney disease:  -Creatinine improved with hydration, patient will be resumed on Lasix on discharge   History of lower extremity DVT:  -Last episode 2 years ago. Patient was on heparin GTT during hospital stay as warfarin was held for cardiac cath, warfarin resumed on discharge 10 mg daily, till she is seen by warfarin clinic this coming Friday.    Code Status: Full Family Communication: Spoke with daughter over the phone Disposition Plan: Home with home care   Consultants:  Cardiology  Procedures:  Nuclear stress test 2/19   Cardiac cath 2/23Normal global LV function with mild mid inferior hypocontractility.  Coronary calcification with smooth 20% proximal narrowing followed by 30% stenosis before the first diagonal  branch, 50-60% stenosis in the proximal diagonal 1 vessel with 30% mid LAD smooth narrowing; 20% smooth stenosis in the proximal left circumflex: Artery, and widely patent proximal and mid RCA stents without significant RCA stenoses.  Antibiotics:  None  Significant Tests:  See full reports for all details    Dg Chest 2 View  06/24/2014   CLINICAL DATA:  Shortness of breath for 2 weeks, LEFT arm pain radiating to LEFT chest, history coronary artery disease post MI and coronary stenting, hypertension, type 2 diabetes, CHF, hyperlipidemia, COPD  EXAM: CHEST  2 VIEW  COMPARISON:  04/21/2014  FINDINGS: Upper normal heart size.  Normal mediastinal contours.  Atherosclerotic calcification at aortic arch.  Minimal vascular congestion.  Decreased lung volumes since previous exam with mild  bibasilar atelectasis.  Upper lungs clear.  No pleural effusion or pneumothorax.  Scattered endplate spur formation thoracic spine with generalized osseous demineralization.  IMPRESSION: Bibasilar atelectasis.   Electronically Signed   By: Lavonia [name] M.D.   On: 06/24/2014 19:35   Nm Myocar Multi W/spect W/wall Motion / Ef  06/26/2014   CLINICAL DATA:  This is a 80 y.o. female with a past medical history significant for CAD with past inferior MI in 2004. She has had bare-metal stent to the RCA in 2004 and drug-eluting stent placement to the RCA in 06/2010 in the setting of unstable angina with overall preserved LV function. Last cath in 2012 showing otherwise nonobstructive disease. She presents with dyspnea and new ECG changes  EXAM: MYOCARDIAL IMAGING WITH SPECT (REST AND PHARMACOLOGIC-STRESS - 2 DAY PROTOCOL)  GATED LEFT VENTRICULAR WALL MOTION STUDY  LEFT VENTRICULAR EJECTION FRACTION  TECHNIQUE: Standard myocardial SPECT imaging was performed after resting intravenous injection of 10 mCi Tc-25m sestamibi. Subsequently, on a second day, intravenous infusion of Lexiscan was performed under the supervision of the Cardiology staff. At peak effect of the drug, 30 mCi Tc-43m sestamibi was injected intravenously and standard myocardial SPECT imaging was performed. Quantitative gated imaging was also performed to evaluate left ventricular wall motion, and estimate left ventricular ejection fraction.  COMPARISON:  None.  FINDINGS: Perfusion: There is a moderate size, moderate severity perfusion defect involving most of the inferior wall and a small mild anteroapical perfusion defect on the stress images. Both defects improve on the resting images.  Wall Motion: Borderline low left ventricular systolic function with mild inferior hypokinesis . No left ventricular dilation.  Left Ventricular Ejection Fraction: 51 %  End diastolic volume 123XX123 ml  End systolic volume 62 ml  IMPRESSION: 1.  Moderate inferior ischemia and  mild anteroapical ischemia.  2.  Inferior wall hypokinesis.  3. Left ventricular ejection fraction 51%  4. High-risk stress test findings*.  Suggestive of multivessel CAD.  *2012 Appropriate Use Criteria for Coronary Revascularization Focused Update: J Am Coll Cardiol. [id number]. http://content.airportbarriers.com.aspx?articleid=1201161   Electronically Signed   By: Sanda Klein   On: 06/26/2014 16:57     Today   Subjective:   [name] today has no headache,no chest abdominal pain,no new weakness tingling or numbness, feels much better wants to go home today.  Objective:   Blood pressure 151/65, pulse 81, temperature 98.1 F (36.7 C), temperature source Oral, resp. rate 18, height 5\' 7"  (1.702 m), weight 168.284 kg (371 lb), SpO2 98 %.  Intake/Output Summary (Last 24 hours) at 06/30/14 1250 Last data filed at 06/30/14 0815  Gross per 24 hour  Intake    480 ml  Output  1 ml  Net    479 ml    Exam Awake Alert, Oriented *3, No new F.N deficits, Normal affect [place].AT,PERRAL Supple Neck,No JVD, No cervical lymphadenopathy appriciated.  Symmetrical Chest wall movement, Good air movement bilaterally, CTAB RRR,No Gallops,Rubs or new Murmurs, No Parasternal Heave +ve B.Sounds, Abd Soft, Non tender, No organomegaly appriciated, No rebound -guarding or rigidity. No Cyanosis, Clubbing or edema, No new Rash or bruise  Data Review   Cultures -  CBC w Diff: Lab Results  Component Value Date   WBC 9.3 06/30/2014   WBC 8.2 05/12/2013   HGB 9.1* 06/30/2014   HCT 29.4* 06/30/2014   PLT 253 06/30/2014   LYMPHOPCT 12 04/23/2014   MONOPCT 7 04/23/2014   EOSPCT 1 04/23/2014   BASOPCT 0 04/23/2014   CMP: Lab Results  Component Value Date   NA 138 06/30/2014   NA 140 06/04/2014   K 4.5 06/30/2014   CL 110 06/30/2014   CO2 25 06/30/2014   BUN 10 06/30/2014   BUN 24 06/04/2014   CREATININE 1.09 06/30/2014   CREATININE 1.4* 05/13/2012   CREATININE 1.33* 10/21/2010    GLU 244 05/13/2012   PROT 7.4 06/04/2014   PROT 7.3 04/22/2014   ALBUMIN 2.7* 04/22/2014   BILITOT 0.3 06/04/2014   ALKPHOS 92 06/04/2014   AST 20 06/04/2014   ALT 17 06/04/2014  .  Micro Results No results found for this or any previous visit (from the past 240 hour(s)).   Discharge Instructions     Follow with your warfarin clinic this coming Friday Follow-up Information    Follow up with [facility], [facility], MD In 1 week.   Specialty:  Internal Medicine   Why:  Posthospitalization follow-up   Contact information:   [place] [address] [phone]       Discharge Medications     Medication List    TAKE these medications        acetaminophen 325 MG tablet  Commonly known as:  TYLENOL  Take 2 tablets (650 mg total) by mouth every 6 (six) hours as needed for mild pain (or Fever >/= 101).     albuterol 108 (90 BASE) MCG/ACT inhaler  Commonly known as:  PROVENTIL HFA;VENTOLIN HFA  Inhale 2 puffs into the lungs every 6 (six) hours as needed for wheezing or shortness of breath.     aspirin 81 MG EC tablet  Take 1 tablet (81 mg total) by mouth daily.     CINNAMON PO  Take by mouth. 1 by mouth twice daily     clonazePAM 1 MG tablet  Commonly known as:  KLONOPIN  Take one tablet in the morning and two tablets at bedtime for sleep     colchicine 0.6 MG tablet  Commonly known as:  COLCRYS  Take 1 tablet by mouth every day for gout     diphenhydrAMINE 25 MG tablet  Commonly known as:  BENADRYL  Take 50 mg by mouth at bedtime as needed for itching.     DULoxetine 60 MG capsule  Commonly known as:  CYMBALTA  Take 1 capsule by mouth every day for anxiety     furosemide 20 MG tablet  Commonly known as:  LASIX  Take one tablet by mouth twice daily for edema     guaiFENesin-dextromethorphan 100-10 MG/5ML syrup  Commonly known as:  ROBITUSSIN DM  Take 5 mLs by mouth every 4 (four) hours as needed for cough.     hydrocortisone  cream 1 %  Apply  1 application topically daily as needed for itching.     Insulin Glargine 300 UNIT/ML Sopn  Commonly known as:  TOUJEO SOLOSTAR  Inject 100 Units into the skin daily.     Insulin Lispro (Human) 200 UNIT/ML Sopn  Commonly known as:  HUMALOG KWIKPEN  Inject 25 Units into the skin 2 (two) times daily with a meal.     isosorbide mononitrate 60 MG 24 hr tablet  Commonly known as:  IMDUR  Take 1 tablet (60 mg total) by mouth daily.     Linaclotide 145 MCG Caps capsule  Commonly known as:  LINZESS  Take 1 capsule (145 mcg total) by mouth daily. Sample provided     metoprolol succinate 25 MG 24 hr tablet  Commonly known as:  TOPROL-XL  Take 1 tablet by mouth once every day for blood pressure     MUCINEX SINUS-MAX CONGESTION 5-325-200 MG Tabs  Generic drug:  Phenylephrine-APAP-Guaifenesin  Take 1 tablet by mouth daily as needed (congestion).     nitroGLYCERIN 0.4 MG SL tablet  Commonly known as:  NITROSTAT  Take one tablet under the tongue every 5 minutes as needed for chest pain     oxyCODONE-acetaminophen 10-325 MG per tablet  Commonly known as:  PERCOCET  Take one tablet every 6 hours as needed for pain     pantoprazole 40 MG tablet  Commonly known as:  PROTONIX  Take 1 tablet by mouth every day     pravastatin 20 MG tablet  Commonly known as:  PRAVACHOL  Take 1 tablet by mouth once every day for cholesterol     warfarin 5 MG tablet  Commonly known as:  COUMADIN  Take 2 tablets (10 mg total) by mouth daily. Please take 10 mg oral daily today, he stands Thursday, follow with INR clinic Friday morning, have your level checked, and your dose adjusted.         Total Time in preparing paper work, data evaluation and todays exam - 35 minutes  Jacksen Isip M.D on 06/30/2014 at 12:50 PM  [facility] Office  [phone]

## 2014-06-30 NOTE — Progress Notes (Signed)
SUBJECTIVE:  Patient is stable today. Cardiac catheterization was done from the right radial artery yesterday. There was only minimal coronary disease. Recommendation is for medical therapy. No further cardiac workup will be needed. [name] Coumadin will need to be restarted.   Filed Vitals:   06/29/14 1522 06/29/14 1616 06/29/14 2114 06/30/14 0615  BP: 121/94 154/61 145/49 140/80  Pulse: 87 79 73 71  Temp:   97.6 F (36.4 C) 98.1 F (36.7 C)  TempSrc:   Oral Oral  Resp:   17 18  Height:      Weight:      SpO2: 99% 99% 99% 98%     Intake/Output Summary (Last 24 hours) at 06/30/14 0825 Last data filed at 06/30/14 0700  Gross per 24 hour  Intake    240 ml  Output      0 ml  Net    240 ml    LABS: Basic Metabolic Panel:  Recent Labs  06/28/14 0347 06/29/14 0430  NA 137 137  K 4.4 4.4  CL 105 109  CO2 28 25  GLUCOSE 186* 163*  BUN 18 15  CREATININE 1.26* 1.15*  CALCIUM 8.3* 8.0*   Liver Function Tests: No results for input(s): AST, ALT, ALKPHOS, BILITOT, PROT, ALBUMIN in the last 72 hours. No results for input(s): LIPASE, AMYLASE in the last 72 hours. CBC:  Recent Labs  06/29/14 0430 06/30/14 0508  WBC 10.2 9.3  HGB 8.9* 9.1*  HCT 28.4* 29.4*  MCV 87.9 89.6  PLT 248 253   Cardiac Enzymes: No results for input(s): CKTOTAL, CKMB, CKMBINDEX, TROPONINI in the last 72 hours. BNP: Invalid input(s): POCBNP D-Dimer: No results for input(s): DDIMER in the last 72 hours. Hemoglobin A1C: No results for input(s): HGBA1C in the last 72 hours. Fasting Lipid Panel: No results for input(s): CHOL, HDL, LDLCALC, TRIG, CHOLHDL, LDLDIRECT in the last 72 hours. Thyroid Function Tests: No results for input(s): TSH, T4TOTAL, T3FREE, THYROIDAB in the last 72 hours.  Invalid input(s): FREET3  RADIOLOGY: Dg Chest 2 View  06/24/2014   CLINICAL DATA:  Shortness of breath for 2 weeks, LEFT arm pain radiating to LEFT chest, history coronary artery disease post MI  and coronary stenting, hypertension, type 2 diabetes, CHF, hyperlipidemia, COPD  EXAM: CHEST  2 VIEW  COMPARISON:  04/21/2014  FINDINGS: Upper normal heart size.  Normal mediastinal contours.  Atherosclerotic calcification at aortic arch.  Minimal vascular congestion.  Decreased lung volumes since previous exam with mild bibasilar atelectasis.  Upper lungs clear.  No pleural effusion or pneumothorax.  Scattered endplate spur formation thoracic spine with generalized osseous demineralization.  IMPRESSION: Bibasilar atelectasis.   Electronically Signed   By: Lavonia [name] M.D.   On: 06/24/2014 19:35   Nm Myocar Multi W/spect W/wall Motion / Ef  06/26/2014   CLINICAL DATA:  This is a 80 y.o. female with a past medical history significant for CAD with past inferior MI in 2004. She has had bare-metal stent to the RCA in 2004 and drug-eluting stent placement to the RCA in 06/2010 in the setting of unstable angina with overall preserved LV function. Last cath in 2012 showing otherwise nonobstructive disease. She presents with dyspnea and new ECG changes  EXAM: MYOCARDIAL IMAGING WITH SPECT (REST AND PHARMACOLOGIC-STRESS - 2 DAY PROTOCOL)  GATED LEFT VENTRICULAR WALL MOTION STUDY  LEFT VENTRICULAR EJECTION FRACTION  TECHNIQUE: Standard myocardial SPECT imaging was performed after resting intravenous injection of 10 mCi Tc-57m sestamibi. Subsequently, on a  second day, intravenous infusion of Lexiscan was performed under the supervision of the Cardiology staff. At peak effect of the drug, 30 mCi Tc-67m sestamibi was injected intravenously and standard myocardial SPECT imaging was performed. Quantitative gated imaging was also performed to evaluate left ventricular wall motion, and estimate left ventricular ejection fraction.  COMPARISON:  None.  FINDINGS: Perfusion: There is a moderate size, moderate severity perfusion defect involving most of the inferior wall and a small mild anteroapical perfusion defect on the stress  images. Both defects improve on the resting images.  Wall Motion: Borderline low left ventricular systolic function with mild inferior hypokinesis . No left ventricular dilation.  Left Ventricular Ejection Fraction: 51 %  End diastolic volume 123XX123 ml  End systolic volume 62 ml  IMPRESSION: 1.  Moderate inferior ischemia and mild anteroapical ischemia.  2.  Inferior wall hypokinesis.  3. Left ventricular ejection fraction 51%  4. High-risk stress test findings*.  Suggestive of multivessel CAD.  *2012 Appropriate Use Criteria for Coronary Revascularization Focused Update: J Am Coll Cardiol. [id number]. http://[email or web address].aspx?articleid=[email or web address]   Electronically Signed   By: Sanda Klein   On: 06/26/2014 16:57    PHYSICAL EXAM   patient is stable today. She is significantly overweight. Cath site at the right radial is stable. Cardiac exam reveals S1 and S2.   ASSESSMENT AND PLAN:    DOE (dyspnea on exertion)   Hyperlipidemia   Essential hypertension    CAD (coronary artery disease)     Catheterization has shown mild coronary disease. No further cardiac workup is needed.    Pulmonary embolus     Patient has a history of pulmonary emboli. Coumadin should be restarted.    Chronic diastolic congestive heart failure     Her volume status appears to be stable at this time.    DM type 2 causing CKD stage 3   Long term current use of anticoagulant therapy   Debility   OSA (obstructive sleep apnea)   COPD (chronic obstructive pulmonary disease)    Cardiology will sign off at this time.   Dola Argyle 06/30/2014 8:25 AM

## 2014-06-30 NOTE — Progress Notes (Signed)
ANTICOAGULATION CONSULT NOTE - Follow Up Consult  Pharmacy Consult for Heparin and Coumadin Indication: hx DVT   Allergies  Allergen Reactions  . Sulfonamide Derivatives Swelling    To mouth    Patient Measurements: Height: 5\' 7"  (170.2 cm) Weight: (!) 371 lb (168.284 kg) IBW/kg (Calculated) : 61.6 Heparin Dosing Weight: 104kg  Vital Signs: Temp: 98.1 F (36.7 C) (02/23 0615) Temp Source: Oral (02/23 0615) BP: 140/80 mmHg (02/23 0615) Pulse Rate: 71 (02/23 0615)  Labs:  Recent Labs  06/28/14 0347 06/28/14 0800 06/28/14 1958 06/29/14 0430 06/30/14 0508 06/30/14 0755  HGB 9.3*  --   --  8.9* 9.1*  --   HCT 30.6*  --   --  28.4* 29.4*  --   PLT 270  --   --  248 253  --   LABPROT 24.0*  --  21.4* 20.4* 16.1*  --   INR 2.13*  --  1.84* 1.73* 1.27  --   HEPARINUNFRC  --  0.54  --  0.67  --  0.50  CREATININE 1.26*  --   --  1.15* 1.09  --     Estimated Creatinine Clearance: 71.2 mL/min (by C-G formula based on Cr of 1.09).   Medications:  Heparin @ 2000 units/hr  Assessment: 79yof on coumadin pta for hx DVT, admitted with dyspnea on exertion ? related to cardiac ischemia. Coumadin placed on hold and she was started on heparin. She underwent a 2 day stress test which was high risk with evidence of inferior and anteroapical ischemia so she had a cath yesterday which showed mild CAD. Coumadin was resumed post-cath with a heparin bridge until INR therapeutic. Heparin level is therapeutic. INR has dropped significantly as expected after all the vitamin k she received to help reverse her INR prior to cath (5mg  total on 2/21 and 2.5mg  on 2/22).   Goal of Therapy:  Heparin level 0.3-0.7 units/ml  INR 2-3 Monitor platelets by anticoagulation protocol: Yes   Plan:  Spoke to Dr. [facility] and plan is for patient to go home today without a lovenox bridge. Will recommend to increase home dose to coumadin 10mg  daily with next INR check by the end of the week.   Deboraha Sprang 06/30/2014,10:49 AM

## 2014-06-30 NOTE — Progress Notes (Signed)
Patient discharged and arrangements made by care management for  [facility] PT/OT. Dc instructions given and patient and family verbalized understanding.

## 2014-07-02 DIAGNOSIS — I2699 Other pulmonary embolism without acute cor pulmonale: Secondary | ICD-10-CM | POA: Diagnosis not present

## 2014-07-02 DIAGNOSIS — E1122 Type 2 diabetes mellitus with diabetic chronic kidney disease: Secondary | ICD-10-CM | POA: Diagnosis not present

## 2014-07-02 DIAGNOSIS — I5031 Acute diastolic (congestive) heart failure: Secondary | ICD-10-CM | POA: Diagnosis not present

## 2014-07-02 DIAGNOSIS — I2510 Atherosclerotic heart disease of native coronary artery without angina pectoris: Secondary | ICD-10-CM | POA: Diagnosis not present

## 2014-07-03 ENCOUNTER — Ambulatory Visit (INDEPENDENT_AMBULATORY_CARE_PROVIDER_SITE_OTHER): Payer: Medicare PPO

## 2014-07-03 DIAGNOSIS — Z5181 Encounter for therapeutic drug level monitoring: Secondary | ICD-10-CM

## 2014-07-03 DIAGNOSIS — I2699 Other pulmonary embolism without acute cor pulmonale: Secondary | ICD-10-CM

## 2014-07-03 DIAGNOSIS — Z7901 Long term (current) use of anticoagulants: Secondary | ICD-10-CM

## 2014-07-03 LAB — POCT INR: INR: 1.4

## 2014-07-08 ENCOUNTER — Ambulatory Visit (INDEPENDENT_AMBULATORY_CARE_PROVIDER_SITE_OTHER): Payer: Medicare PPO | Admitting: Internal Medicine

## 2014-07-08 ENCOUNTER — Encounter: Payer: Self-pay | Admitting: Internal Medicine

## 2014-07-08 VITALS — BP 150/72 | HR 73 | Temp 97.4°F | Resp 20 | Ht 67.0 in | Wt 368.0 lb

## 2014-07-08 DIAGNOSIS — N6452 Nipple discharge: Secondary | ICD-10-CM

## 2014-07-08 DIAGNOSIS — N183 Chronic kidney disease, stage 3 unspecified: Secondary | ICD-10-CM

## 2014-07-08 DIAGNOSIS — R0600 Dyspnea, unspecified: Secondary | ICD-10-CM

## 2014-07-08 DIAGNOSIS — G894 Chronic pain syndrome: Secondary | ICD-10-CM | POA: Diagnosis not present

## 2014-07-08 DIAGNOSIS — R0609 Other forms of dyspnea: Secondary | ICD-10-CM

## 2014-07-08 DIAGNOSIS — N939 Abnormal uterine and vaginal bleeding, unspecified: Secondary | ICD-10-CM | POA: Diagnosis not present

## 2014-07-08 DIAGNOSIS — E1122 Type 2 diabetes mellitus with diabetic chronic kidney disease: Secondary | ICD-10-CM | POA: Diagnosis not present

## 2014-07-08 MED ORDER — OXYCODONE-ACETAMINOPHEN 10-325 MG PO TABS: ORAL_TABLET | ORAL | Status: DC

## 2014-07-08 NOTE — Patient Instructions (Addendum)
Need to know name of meter to send test strips  Follow up in 2-3 months  Continue current medications as prescribed

## 2014-07-08 NOTE — Progress Notes (Signed)
Patient ID: [name], female   DOB: 02-16-1935, 80 y.o.   MRN: [id number]    Facility  [name]    Place of Service:   OFFICE   Allergies  Allergen Reactions  . Sulfonamide Derivatives Swelling    To mouth    Chief Complaint  Patient presents with  . Hospitalization Follow-up    weak and tired    HPI:  80 yo female seen today for hospital f/u.  She was dc'd with DOE, hyperlipidemia, HTN, obesity, DM and CAD. She takes coumadin for DVT and hx PE and will need lifelong tx. DM poorly controlled with A1c 10.2%. She had a cardiac cath which showed min CAD and no intervention req'd. DOE thought to be multifactorial. She does not wear her CPAP mask every night.  Today she feels tired and weak with SOB with exertion. This is unchanged since d/c. BS 110-140s usually. She is out of test strips.  Checks them once daily fasting.  She c/o monthly vaginal pain and bleeding with menstrual cramping. She has dx vaginal polyp. Last month, noted left nipple milky d/c. She is being followed by GYN and breast radiologist. She has not followed up for repeat imaging study.  Past Medical History  Diagnosis Date  . COPD (chronic obstructive pulmonary disease)     patient denies this  . Diabetes mellitus   . Diverticular disease   . Hypertension   . Hyperlipidemia   . Coronary artery disease     a. s/p IMI 2004 tx with BMS to RCA;  b. s/p Promus DES to RCA 2/12 (cath: LM 20-30%, pLAD 20-30%, mLAD 50%, RI 30%, pRCA 60%, mRCA 90% - tx with PCI);  c. myoview 1/07: EF 49%, inf scar, no isch  . Urinary incontinence   . Insomnia   . Osteoarthritis   . Morbid obesity   . Obstructive sleep apnea   . Polymyalgia rheumatica   . Anxiety   . Arthritis   . Depression   . Cholelithiasis   . CHF (congestive heart failure)   . GERD (gastroesophageal reflux disease)   . Pulmonary emboli 9/13    felt to need lifelong anticoagulation  . Debility   . Unspecified constipation   . Allergy   . Gout,  unspecified   . Dysuria   . Urinary complications   . Pain, chronic   . Allergic rhinitis due to pollen   . Osteoarthrosis, unspecified whether generalized or localized, lower leg   . Type II or unspecified type diabetes mellitus without mention of complication, uncontrolled   . Anemia, unspecified   . Coronary atherosclerosis of native coronary artery   . Other malaise and fatigue    Past Surgical History  Procedure Laterality Date  . Coronary artery bypass graft    . Appendectomy    . Cardiac catheterization    . Gastric bypass  1977     reversed in [facility], [facility]  . Blood clots/legs and lungs  2013  . Mi with stent placement  2004  . Left heart catheterization with coronary angiogram N/A 06/29/2014    Procedure: LEFT HEART CATHETERIZATION WITH CORONARY ANGIOGRAM;  Surgeon: Troy Sine, MD;  Location: [facility] CATH LAB;  Service: Cardiovascular;  Laterality: N/A;   History   Social History  . Marital Status: Widowed    Spouse Name: N/A  . Number of Children: N/A  . Years of Education: N/A   Occupational History  . retired    Social History Main  Topics  . Smoking status: Never Smoker   . Smokeless tobacco: Not on file  . Alcohol Use: No  . Drug Use: No  . Sexual Activity: Not Currently   Other Topics Concern  . None   Social History Narrative   Past medical, surgical, family and social history reviewed  Medications: Patient's Medications  New Prescriptions   No medications on file  Previous Medications   ACETAMINOPHEN (TYLENOL) 325 MG TABLET    Take 2 tablets (650 mg total) by mouth every 6 (six) hours as needed for mild pain (or Fever >/= 101).   ALBUTEROL (PROVENTIL HFA;VENTOLIN HFA) 108 (90 BASE) MCG/ACT INHALER    Inhale 2 puffs into the lungs every 6 (six) hours as needed for wheezing or shortness of breath.   ASPIRIN EC 81 MG EC TABLET    Take 1 tablet (81 mg total) by mouth daily.   CINNAMON PO    Take by mouth. 1 by mouth twice daily   CLONAZEPAM  (KLONOPIN) 1 MG TABLET    Take one tablet in the morning and two tablets at bedtime for sleep   COLCHICINE (COLCRYS) 0.6 MG TABLET    Take 1 tablet by mouth every day for gout   DIPHENHYDRAMINE (BENADRYL) 25 MG TABLET    Take 50 mg by mouth at bedtime as needed for itching.   DULOXETINE (CYMBALTA) 60 MG CAPSULE    Take 1 capsule by mouth every day for anxiety   FUROSEMIDE (LASIX) 20 MG TABLET    Take one tablet by mouth twice daily for edema   GUAIFENESIN-DEXTROMETHORPHAN (ROBITUSSIN DM) 100-10 MG/5ML SYRUP    Take 5 mLs by mouth every 4 (four) hours as needed for cough.   HYDROCORTISONE CREAM 1 %    Apply 1 application topically daily as needed for itching.   INSULIN GLARGINE (TOUJEO SOLOSTAR) 300 UNIT/ML SOPN    Inject 100 Units into the skin daily.   INSULIN LISPRO, HUMAN, (HUMALOG KWIKPEN) 200 UNIT/ML SOPN    Inject 25 Units into the skin 2 (two) times daily with a meal.   ISOSORBIDE MONONITRATE (IMDUR) 60 MG 24 HR TABLET    Take 1 tablet (60 mg total) by mouth daily.   LINACLOTIDE (LINZESS) 145 MCG CAPS CAPSULE    Take 1 capsule (145 mcg total) by mouth daily. Sample provided   METOPROLOL SUCCINATE (TOPROL-XL) 25 MG 24 HR TABLET    Take 1 tablet by mouth once every day for blood pressure   NITROGLYCERIN (NITROSTAT) 0.4 MG SL TABLET    Take one tablet under the tongue every 5 minutes as needed for chest pain   OXYCODONE-ACETAMINOPHEN (PERCOCET) 10-325 MG PER TABLET    Take one tablet every 6 hours as needed for pain   PANTOPRAZOLE (PROTONIX) 40 MG TABLET    Take 1 tablet by mouth every day   PHENYLEPHRINE-APAP-GUAIFENESIN (MUCINEX SINUS-MAX CONGESTION) 5-325-200 MG TABS    Take 1 tablet by mouth daily as needed (congestion).    PRAVASTATIN (PRAVACHOL) 20 MG TABLET    Take 1 tablet by mouth once every day for cholesterol   WARFARIN (COUMADIN) 5 MG TABLET    Take 2 tablets (10 mg total) by mouth daily. Please take 10 mg oral daily today, he stands Thursday, follow with INR clinic Friday morning,  have your level checked, and your dose adjusted.  Modified Medications   No medications on file  Discontinued Medications   No medications on file     Review of Systems  Constitutional: Positive for fatigue. Negative for fever, chills, diaphoresis, activity change and appetite change.  HENT: Negative for ear pain and sore throat.   Eyes: Negative for visual disturbance.  Respiratory: Positive for shortness of breath. Negative for cough and chest tightness.   Cardiovascular: Negative for chest pain, palpitations and leg swelling.  Gastrointestinal: Negative for nausea, vomiting, abdominal pain, diarrhea, constipation and blood in stool.  Genitourinary: Positive for vaginal bleeding and vaginal pain. Negative for dysuria.  Musculoskeletal: Negative for arthralgias.  Neurological: Positive for weakness. Negative for dizziness, tremors, numbness and headaches.  Psychiatric/Behavioral: Negative for sleep disturbance. The patient is not nervous/anxious.     Filed Vitals:   07/08/14 1205  BP: 150/72  Pulse: 73  Temp: 97.4 F (36.3 C)  TempSrc: Oral  Resp: 20  Height: '5\' 7"'  (1.702 m)  Weight: 368 lb (166.924 kg)  SpO2: 96%   Body mass index is 57.62 kg/(m^2).  Physical Exam  Constitutional: She is oriented to person, place, and time. She appears well-developed and well-nourished.  HENT:  Mouth/Throat: Oropharynx is clear and moist. No oropharyngeal exudate.  Eyes: Pupils are equal, round, and reactive to light. No scleral icterus.  Neck: Neck supple. No tracheal deviation present. No thyromegaly present.  Cardiovascular: Normal rate, regular rhythm, normal heart sounds and intact distal pulses.  Exam reveals no gallop and no friction rub.   No murmur heard. Trace LE edema b/l. no calf TTP. No carotid bruit b/l  Pulmonary/Chest: Effort normal and breath sounds normal. No stridor. No respiratory distress. She has no wheezes. She has no rales.  Reduced BS at base b/l  Abdominal:  Soft. Bowel sounds are normal. She exhibits no distension and no mass. There is no tenderness. There is no rebound and no guarding.  Musculoskeletal: She exhibits edema and tenderness.  Lymphadenopathy:    She has no cervical adenopathy.  Neurological: She is alert and oriented to person, place, and time. She has normal reflexes.  Skin: Skin is warm and dry. No rash noted.  Psychiatric: Her behavior is normal. Judgment and thought content normal. Her affect is blunt.     Labs reviewed: Anti-coag visit on 07/03/2014  Component Date Value Ref Range Status  . INR 07/03/2014 1.4   Final  Admission on 06/24/2014, Discharged on 06/30/2014  No results displayed because visit has over 200 results.    Appointment on 06/04/2014  Component Date Value Ref Range Status  . Glucose 06/04/2014 158* 65 - 99 mg/dL Final  . BUN 06/04/2014 24  8 - 27 mg/dL Final  . Creatinine, Ser 06/04/2014 1.30* 0.57 - 1.00 mg/dL Final  . GFR calc non Af Amer 06/04/2014 40* >59 mL/min/1.73 Final  . GFR calc Af Amer 06/04/2014 46* >59 mL/min/1.73 Final  . BUN/Creatinine Ratio 06/04/2014 18  11 - 26 Final  . Sodium 06/04/2014 140  134 - 144 mmol/L Final  . Potassium 06/04/2014 4.8  3.5 - 5.2 mmol/L Final  . Chloride 06/04/2014 102  97 - 108 mmol/L Final  . CO2 06/04/2014 22  18 - 29 mmol/L Final  . Calcium 06/04/2014 8.6* 8.7 - 10.3 mg/dL Final  . Total Protein 06/04/2014 7.4  6.0 - 8.5 g/dL Final  . Albumin 06/04/2014 3.5  3.5 - 4.8 g/dL Final  . Globulin, Total 06/04/2014 3.9  1.5 - 4.5 g/dL Final  . Albumin/Globulin Ratio 06/04/2014 0.9* 1.1 - 2.5 Final  . Total Bilirubin 06/04/2014 0.3  0.0 - 1.2 mg/dL Final  . Alkaline Phosphatase 06/04/2014 92  39 - 117 IU/L Final  . AST 06/04/2014 20  0 - 40 IU/L Final  . ALT 06/04/2014 17  0 - 32 IU/L Final  . Hgb A1c MFr Bld 06/04/2014 10.2* 4.8 - 5.6 % Final   Comment:          Pre-diabetes: 5.7 - 6.4          Diabetes: >6.4          Glycemic control for adults with  diabetes: <7.0   . Est. average glucose Bld gHb Est-m* 06/04/2014 246   Final  . Creatinine, Ur 06/04/2014 55.2  15.0 - 278.0 mg/dL Final  . Microalbum.,U,Random 06/04/2014 10.7  0.0 - 17.0 ug/mL Final  . MICROALB/CREAT RATIO 06/04/2014 19.4  0.0 - 30.0 mg/g creat Final  . Cholesterol, Total 06/04/2014 171  100 - 199 mg/dL Final  . Triglycerides 06/04/2014 225* 0 - 149 mg/dL Final  . HDL 06/04/2014 41  >39 mg/dL Final   Comment: According to ATP-III Guidelines, HDL-C >59 mg/dL is considered a negative risk factor for CHD.   [name] VLDL Cholesterol Cal 06/04/2014 45* 5 - 40 mg/dL Final  . LDL Calculated 06/04/2014 85  0 - 99 mg/dL Final  . Chol/HDL Ratio 06/04/2014 4.2  0.0 - 4.4 ratio units Final   Comment:                                   T. Chol/HDL Ratio                                             Men  Women                               1/2 Avg.Risk  3.4    3.3                                   Avg.Risk  5.0    4.4                                2X Avg.Risk  9.6    7.1                                3X Avg.Risk 23.4   11.0   Abstract on 05/27/2014  Component Date Value Ref Range Status  . HM Diabetic Eye Exam 05/26/2014 No Retinopathy  No Retinopathy Final   [facility]-Dr. Katy Fitch  Anti-coag visit on 05/13/2014  Component Date Value Ref Range Status  . INR 05/13/2014 2.1   Final  Admission on 04/21/2014, Discharged on 04/24/2014  Component Date Value Ref Range Status  . WBC 04/21/2014 11.9* 4.0 - 10.5 K/uL Final  . RBC 04/21/2014 4.21  3.87 - 5.11 MIL/uL Final  . Hemoglobin 04/21/2014 11.4* 12.0 - 15.0 g/dL Final  . HCT 04/21/2014 36.3  36.0 - 46.0 % Final  . MCV 04/21/2014 86.2  78.0 - 100.0 fL Final  . MCH 04/21/2014 27.1  26.0 - 34.0 pg Final  . MCHC 04/21/2014 31.4  30.0 -  36.0 g/dL Final  . RDW 04/21/2014 14.6  11.5 - 15.5 % Final  . Platelets 04/21/2014 265  150 - 400 K/uL Final  . Neutrophils Relative % 04/21/2014 88* 43 - 77 % Final  . Neutro Abs 04/21/2014 10.5*  1.7 - 7.7 K/uL Final  . Lymphocytes Relative 04/21/2014 10* 12 - 46 % Final  . Lymphs Abs 04/21/2014 1.2  0.7 - 4.0 K/uL Final  . Monocytes Relative 04/21/2014 2* 3 - 12 % Final  . Monocytes Absolute 04/21/2014 0.2  0.1 - 1.0 K/uL Final  . Eosinophils Relative 04/21/2014 0  0 - 5 % Final  . Eosinophils Absolute 04/21/2014 0.0  0.0 - 0.7 K/uL Final  . Basophils Relative 04/21/2014 0  0 - 1 % Final  . Basophils Absolute 04/21/2014 0.0  0.0 - 0.1 K/uL Final  . Sodium 04/21/2014 133* 137 - 147 mEq/L Final  . Potassium 04/21/2014 4.9  3.7 - 5.3 mEq/L Final  . Chloride 04/21/2014 92* 96 - 112 mEq/L Final  . CO2 04/21/2014 24  19 - 32 mEq/L Final  . Glucose, Bld 04/21/2014 252* 70 - 99 mg/dL Final  . BUN 04/21/2014 19  6 - 23 mg/dL Final  . Creatinine, Ser 04/21/2014 1.32* 0.50 - 1.10 mg/dL Final  . Calcium 04/21/2014 9.3  8.4 - 10.5 mg/dL Final  . Total Protein 04/21/2014 8.4* 6.0 - 8.3 g/dL Final  . Albumin 04/21/2014 3.4* 3.5 - 5.2 g/dL Final  . AST 04/21/2014 23  0 - 37 U/L Final  . ALT 04/21/2014 29  0 - 35 U/L Final  . Alkaline Phosphatase 04/21/2014 96  39 - 117 U/L Final  . Total Bilirubin 04/21/2014 0.7  0.3 - 1.2 mg/dL Final  . GFR calc non Af Amer 04/21/2014 38* >90 mL/min Final  . GFR calc Af Amer 04/21/2014 44* >90 mL/min Final   Comment: (NOTE) The eGFR has been calculated using the CKD EPI equation. This calculation has not been validated in all clinical situations. eGFR's persistently <90 mL/min signify possible Chronic Kidney Disease.   . Anion gap 04/21/2014 17* 5 - 15 Final  . Specimen Description 04/21/2014 BLOOD LEFT ANTECUBITAL   Final  . Special Requests 04/21/2014 BOTTLES DRAWN AEROBIC AND ANAEROBIC 5CC   Final  . Culture  Setup Time 04/21/2014    Final                   Value:04/21/2014 17:24 Performed at [facility]   . Culture 04/21/2014    Final                   Value:ESCHERICHIA COLI Note: SUSCEPTIBILITIES PERFORMED ON PREVIOUS CULTURE WITHIN  THE LAST 5 DAYS. Note: Gram Stain Report Called to,Read Back By and Verified With: [facility] PORTER 04/22/14 0455A [place] Performed at [facility]   . Report Status 04/21/2014 04/24/2014 FINAL   Final  . Specimen Description 04/21/2014 BLOOD RIGHT ANTECUBITAL   Final  . Special Requests 04/21/2014 BOTTLES DRAWN AEROBIC AND ANAEROBIC 10CC   Final  . Culture  Setup Time 04/21/2014    Final                   Value:04/21/2014 17:23 Performed at [facility]   . Culture 04/21/2014    Final                   Value:ESCHERICHIA COLI Note: Gram Stain Report Called to,Read Back By and Verified With: Teresita Madura  04/22/14 0455A [place] Performed at [facility]   . Report Status 04/21/2014 04/24/2014 FINAL   Final  . Organism ID, Bacteria 04/21/2014 ESCHERICHIA COLI   Final  . Glucose-Capillary 04/21/2014 215* 70 - 99 mg/dL Final  . Color, Urine 04/21/2014 YELLOW  YELLOW Final  . APPearance 04/21/2014 TURBID* CLEAR Final  . Specific Gravity, Urine 04/21/2014 1.014  1.005 - 1.030 Final  . pH 04/21/2014 6.0  5.0 - 8.0 Final  . Glucose, UA 04/21/2014 NEGATIVE  NEGATIVE mg/dL Final  . Hgb urine dipstick 04/21/2014 MODERATE* NEGATIVE Final  . Bilirubin Urine 04/21/2014 NEGATIVE  NEGATIVE Final  . Ketones, ur 04/21/2014 NEGATIVE  NEGATIVE mg/dL Final  . Protein, ur 04/21/2014 100* NEGATIVE mg/dL Final  . Urobilinogen, UA 04/21/2014 0.2  0.0 - 1.0 mg/dL Final  . Nitrite 04/21/2014 POSITIVE* NEGATIVE Final  . Leukocytes, UA 04/21/2014 LARGE* NEGATIVE Final  . Sodium 04/21/2014 133* 137 - 147 mEq/L Final  . Potassium 04/21/2014 4.7  3.7 - 5.3 mEq/L Final  . Chloride 04/21/2014 97  96 - 112 mEq/L Final  . BUN 04/21/2014 20  6 - 23 mg/dL Final  . Creatinine, Ser 04/21/2014 1.50* 0.50 - 1.10 mg/dL Final  . Glucose, Bld 04/21/2014 259* 70 - 99 mg/dL Final  . Calcium, Ion 04/21/2014 1.07* 1.13 - 1.30 mmol/L Final  . TCO2 04/21/2014 24  0 - 100 mmol/L Final  . Hemoglobin  04/21/2014 13.9  12.0 - 15.0 g/dL Final  . HCT 04/21/2014 41.0  36.0 - 46.0 % Final  . Squamous Epithelial / LPF 04/21/2014 FEW* RARE Final  . WBC, UA 04/21/2014 TOO NUMEROUS TO COUNT  <3 WBC/hpf Final  . RBC / HPF 04/21/2014 11-20  <3 RBC/hpf Final  . Bacteria, UA 04/21/2014 MANY* RARE Final  . Prothrombin Time 04/21/2014 24.2* 11.6 - 15.2 seconds Final  . INR 04/21/2014 2.16* 0.00 - 1.49 Final  . Specimen Description 04/21/2014 URINE, CATHETERIZED   Final  . Special Requests 04/21/2014 ADDED [id number]   Final  . Culture  Setup Time 04/21/2014    Final                   Value:04/22/2014 04:37 Performed at [facility]   . Colony Count 04/21/2014    Final                   Value:>=100,000 COLONIES/ML Performed at [facility]   . Culture 04/21/2014    Final                   Value:ESCHERICHIA COLI Performed at [facility]   . Report Status 04/21/2014 04/24/2014 FINAL   Final  . Organism ID, Bacteria 04/21/2014 ESCHERICHIA COLI   Final  . Sodium 04/22/2014 130* 137 - 147 mEq/L Final  . Potassium 04/22/2014 5.3  3.7 - 5.3 mEq/L Final  . Chloride 04/22/2014 93* 96 - 112 mEq/L Final  . CO2 04/22/2014 25  19 - 32 mEq/L Final  . Glucose, Bld 04/22/2014 299* 70 - 99 mg/dL Final  . BUN 04/22/2014 22  6 - 23 mg/dL Final  . Creatinine, Ser 04/22/2014 1.54* 0.50 - 1.10 mg/dL Final  . Calcium 04/22/2014 8.4  8.4 - 10.5 mg/dL Final  . Total Protein 04/22/2014 7.3  6.0 - 8.3 g/dL Final  . Albumin 04/22/2014 2.7* 3.5 - 5.2 g/dL Final  . AST 04/22/2014 18  0 - 37 U/L Final  . ALT 04/22/2014 21  0 - 35  U/L Final  . Alkaline Phosphatase 04/22/2014 105  39 - 117 U/L Final  . Total Bilirubin 04/22/2014 0.4  0.3 - 1.2 mg/dL Final  . GFR calc non Af Amer 04/22/2014 31* >90 mL/min Final  . GFR calc Af Amer 04/22/2014 36* >90 mL/min Final   Comment: (NOTE) The eGFR has been calculated using the CKD EPI equation. This calculation has not been validated in all clinical  situations. eGFR's persistently <90 mL/min signify possible Chronic Kidney Disease.   . Anion gap 04/22/2014 12  5 - 15 Final  . WBC 04/22/2014 31.9* 4.0 - 10.5 K/uL Final  . RBC 04/22/2014 3.59* 3.87 - 5.11 MIL/uL Final  . Hemoglobin 04/22/2014 10.1* 12.0 - 15.0 g/dL Final   REPEATED TO VERIFY  . HCT 04/22/2014 31.5* 36.0 - 46.0 % Final  . MCV 04/22/2014 87.7  78.0 - 100.0 fL Final  . MCH 04/22/2014 28.1  26.0 - 34.0 pg Final  . MCHC 04/22/2014 32.1  30.0 - 36.0 g/dL Final  . RDW 04/22/2014 14.6  11.5 - 15.5 % Final  . Platelets 04/22/2014 241  150 - 400 K/uL Final  . MRSA by PCR 04/21/2014 NEGATIVE  NEGATIVE Final   Comment:        The GeneXpert MRSA Assay (FDA approved for NASAL specimens only), is one component of a comprehensive MRSA colonization surveillance program. It is not intended to diagnose MRSA infection nor to guide or monitor treatment for MRSA infections.   . Prothrombin Time 04/22/2014 21.0* 11.6 - 15.2 seconds Final  . INR 04/22/2014 1.79* 0.00 - 1.49 Final  . Glucose-Capillary 04/21/2014 307* 70 - 99 mg/dL Final  . Hgb A1c MFr Bld 04/22/2014 11.4* <5.7 % Final   Comment: (NOTE)                                                                       According to the ADA Clinical Practice Recommendations for 2011, when HbA1c is used as a screening test:  >=6.5%   Diagnostic of Diabetes Mellitus           (if abnormal result is confirmed) 5.7-6.4%   Increased risk of developing Diabetes Mellitus References:Diagnosis and Classification of Diabetes Mellitus,Diabetes XKPV,3748,27(MBEML 1):S62-S69 and Standards of Medical Care in         Diabetes - 2011,Diabetes Care,2011,34 (Suppl 1):S11-S61.   . Mean Plasma Glucose 04/22/2014 280* <117 mg/dL Final   Performed at [facility]  . Glucose-Capillary 04/22/2014 214* 70 - 99 mg/dL Final  . Glucose-Capillary 04/22/2014 184* 70 - 99 mg/dL Final  . Prothrombin Time 04/23/2014 17.3* 11.6 - 15.2 seconds Final    . INR 04/23/2014 1.40  0.00 - 1.49 Final  . WBC 04/23/2014 17.1* 4.0 - 10.5 K/uL Final  . RBC 04/23/2014 3.57* 3.87 - 5.11 MIL/uL Final  . Hemoglobin 04/23/2014 9.7* 12.0 - 15.0 g/dL Final  . HCT 04/23/2014 30.8* 36.0 - 46.0 % Final  . MCV 04/23/2014 86.3  78.0 - 100.0 fL Final  . MCH 04/23/2014 27.2  26.0 - 34.0 pg Final  . MCHC 04/23/2014 31.5  30.0 - 36.0 g/dL Final  . RDW 04/23/2014 14.6  11.5 - 15.5 % Final  . Platelets 04/23/2014 225  150 - 400 K/uL  Final  . Neutrophils Relative % 04/23/2014 80* 43 - 77 % Final  . Neutro Abs 04/23/2014 13.6* 1.7 - 7.7 K/uL Final  . Lymphocytes Relative 04/23/2014 12  12 - 46 % Final  . Lymphs Abs 04/23/2014 2.1  0.7 - 4.0 K/uL Final  . Monocytes Relative 04/23/2014 7  3 - 12 % Final  . Monocytes Absolute 04/23/2014 1.2* 0.1 - 1.0 K/uL Final  . Eosinophils Relative 04/23/2014 1  0 - 5 % Final  . Eosinophils Absolute 04/23/2014 0.2  0.0 - 0.7 K/uL Final  . Basophils Relative 04/23/2014 0  0 - 1 % Final  . Basophils Absolute 04/23/2014 0.0  0.0 - 0.1 K/uL Final  . Sodium 04/23/2014 138  137 - 147 mEq/L Final   DELTA CHECK NOTED  . Potassium 04/23/2014 5.2  3.7 - 5.3 mEq/L Final  . Chloride 04/23/2014 100  96 - 112 mEq/L Final  . CO2 04/23/2014 25  19 - 32 mEq/L Final  . Glucose, Bld 04/23/2014 225* 70 - 99 mg/dL Final  . BUN 04/23/2014 21  6 - 23 mg/dL Final  . Creatinine, Ser 04/23/2014 1.42* 0.50 - 1.10 mg/dL Final  . Calcium 04/23/2014 8.6  8.4 - 10.5 mg/dL Final  . GFR calc non Af Amer 04/23/2014 35* >90 mL/min Final  . GFR calc Af Amer 04/23/2014 40* >90 mL/min Final   Comment: (NOTE) The eGFR has been calculated using the CKD EPI equation. This calculation has not been validated in all clinical situations. eGFR's persistently <90 mL/min signify possible Chronic Kidney Disease.   . Anion gap 04/23/2014 13  5 - 15 Final  . Glucose-Capillary 04/22/2014 168* 70 - 99 mg/dL Final  . Glucose-Capillary 04/22/2014 222* 70 - 99 mg/dL Final  .  Glucose-Capillary 04/23/2014 168* 70 - 99 mg/dL Final  . Glucose-Capillary 04/23/2014 167* 70 - 99 mg/dL Final  . Prothrombin Time 04/24/2014 17.2* 11.6 - 15.2 seconds Final  . INR 04/24/2014 1.39  0.00 - 1.49 Final  . WBC 04/24/2014 9.4  4.0 - 10.5 K/uL Final  . RBC 04/24/2014 3.46* 3.87 - 5.11 MIL/uL Final  . Hemoglobin 04/24/2014 9.5* 12.0 - 15.0 g/dL Final  . HCT 04/24/2014 30.2* 36.0 - 46.0 % Final  . MCV 04/24/2014 87.3  78.0 - 100.0 fL Final  . MCH 04/24/2014 27.5  26.0 - 34.0 pg Final  . MCHC 04/24/2014 31.5  30.0 - 36.0 g/dL Final  . RDW 04/24/2014 14.5  11.5 - 15.5 % Final  . Platelets 04/24/2014 221  150 - 400 K/uL Final  . Glucose-Capillary 04/23/2014 161* 70 - 99 mg/dL Final  . Glucose-Capillary 04/23/2014 187* 70 - 99 mg/dL Final  . Glucose-Capillary 04/24/2014 76  70 - 99 mg/dL Final  . Glucose-Capillary 04/24/2014 135* 70 - 99 mg/dL Final  . Comment 1 04/24/2014 Notify RN   Final  . Comment 2 04/24/2014 Documented in Chart   Final  Anti-coag visit on 04/10/2014  Component Date Value Ref Range Status  . INR 04/10/2014 2.1   Final   Hospital records reviewed. A1c 10.2%. Heart cath performed but no intervention req'd. DOE thought to be multifactorial including obesity.   Assessment/Plan   ICD-9-CM ICD-10-CM   1. Chronic pain syndrome - stable 338.4 G89.4 oxyCODONE-acetaminophen (PERCOCET) 10-325 MG per tablet  2. Vaginal bleeding- each month 623.8 N93.9 CBC with Differential  3. Discharge from left nipple with hx abnormal mammogram and bx - she did not f/u for rpeat bx 611.79 N64.52 Prolactin  MM Digital Diagnostic Unilat L     US BREAST COMPLETE UNI LEFT INC AXILLA     MM Ductogram Unilateral Left  4. DM type 2 causing CKD stage 3- uncontrolled 250.40 X83.29 Basic Metabolic Panel   191.6 O06.0   5. DOE (dyspnea on exertion) - unchanged 786.09 R06.09    --medical compliance emphasized  --f/u with specialists as above including GYN. She was told she has a vaginal  polyp  --check labs today  --wear CPAP each night  --maintain diabetic diet and keep appt with diabetic educator  --continue coumadin for lifetime due to DVT and hx PE  --f/u with cardiology as scheduled  --she will call back with name of glucometer so that test strips Rx can be sent to pharmacy  --f/u in 2-3 mos and prn  Haydin Dunn S. Perlie Gold  [facility] [address] [place], [place] [address] [phone] Office (Wednesdays and Fridays 8 AM - 5 PM) [phone] Cell (Monday-Friday 8 AM - 5 PM)

## 2014-07-09 ENCOUNTER — Other Ambulatory Visit: Payer: Self-pay | Admitting: *Deleted

## 2014-07-09 LAB — CBC WITH DIFFERENTIAL/PLATELET
Basophils Absolute: 0 10*3/uL (ref 0.0–0.2)
Basos: 0 %
Eos: 2 %
Eosinophils Absolute: 0.2 10*3/uL (ref 0.0–0.4)
HCT: 33.4 % — ABNORMAL LOW (ref 34.0–46.6)
Hemoglobin: 10.6 g/dL — ABNORMAL LOW (ref 11.1–15.9)
Immature Grans (Abs): 0 10*3/uL (ref 0.0–0.1)
Immature Granulocytes: 0 %
Lymphocytes Absolute: 2.3 10*3/uL (ref 0.7–3.1)
Lymphs: 28 %
MCH: 27 pg (ref 26.6–33.0)
MCHC: 31.7 g/dL (ref 31.5–35.7)
MCV: 85 fL (ref 79–97)
Monocytes Absolute: 0.7 10*3/uL (ref 0.1–0.9)
Monocytes: 8 %
Neutrophils Absolute: 5 10*3/uL (ref 1.4–7.0)
Neutrophils Relative %: 62 %
Platelets: 312 10*3/uL (ref 150–379)
RBC: 3.93 x10E6/uL (ref 3.77–5.28)
RDW: 15.6 % — ABNORMAL HIGH (ref 12.3–15.4)
WBC: 8.2 10*3/uL (ref 3.4–10.8)

## 2014-07-09 LAB — BASIC METABOLIC PANEL
BUN/Creatinine Ratio: 16 (ref 11–26)
BUN: 22 mg/dL (ref 8–27)
CO2: 26 mmol/L (ref 18–29)
Calcium: 8.8 mg/dL (ref 8.7–10.3)
Chloride: 98 mmol/L (ref 97–108)
Creatinine, Ser: 1.35 mg/dL — ABNORMAL HIGH (ref 0.57–1.00)
GFR calc Af Amer: 44 mL/min/{1.73_m2} — ABNORMAL LOW (ref >59)
GFR calc non Af Amer: 38 mL/min/{1.73_m2} — ABNORMAL LOW (ref >59)
Glucose: 188 mg/dL — ABNORMAL HIGH (ref 65–99)
Potassium: 4.9 mmol/L (ref 3.5–5.2)
Sodium: 139 mmol/L (ref 134–144)

## 2014-07-09 LAB — PROLACTIN: Prolactin: 14.4 ng/mL (ref 4.8–23.3)

## 2014-07-09 MED ORDER — GLUCOSE BLOOD VI STRP: ORAL_STRIP | Status: DC

## 2014-07-09 NOTE — Telephone Encounter (Signed)
Patient Requested to be faxed to pharmacy. 

## 2014-07-10 ENCOUNTER — Other Ambulatory Visit: Payer: Self-pay | Admitting: Internal Medicine

## 2014-07-10 DIAGNOSIS — N6452 Nipple discharge: Secondary | ICD-10-CM

## 2014-07-13 ENCOUNTER — Ambulatory Visit (INDEPENDENT_AMBULATORY_CARE_PROVIDER_SITE_OTHER): Payer: Medicare PPO | Admitting: Pharmacist

## 2014-07-13 DIAGNOSIS — Z5181 Encounter for therapeutic drug level monitoring: Secondary | ICD-10-CM | POA: Diagnosis not present

## 2014-07-13 DIAGNOSIS — I2699 Other pulmonary embolism without acute cor pulmonale: Secondary | ICD-10-CM | POA: Diagnosis not present

## 2014-07-13 DIAGNOSIS — Z7901 Long term (current) use of anticoagulants: Secondary | ICD-10-CM | POA: Diagnosis not present

## 2014-07-13 LAB — POCT INR: INR: 2.1

## 2014-07-15 ENCOUNTER — Telehealth: Payer: Self-pay | Admitting: *Deleted

## 2014-07-15 NOTE — Telephone Encounter (Signed)
Patient called requesting lab results, informed her that they were mailed to her and i would be glad to give them to her over the phone and re-mail them again if she would like. She stated that she didn't have any question at the present time.

## 2014-07-22 ENCOUNTER — Ambulatory Visit
Admission: RE | Admit: 2014-07-22 | Discharge: 2014-07-22 | Disposition: A | Discharge status: Home / Self Care | Payer: Medicare PPO | Source: Ambulatory Visit | Attending: Internal Medicine | Admitting: Internal Medicine

## 2014-07-22 ENCOUNTER — Other Ambulatory Visit: Payer: Self-pay | Admitting: Internal Medicine

## 2014-07-22 DIAGNOSIS — N6320 Unspecified lump in the left breast, unspecified quadrant: Secondary | ICD-10-CM

## 2014-07-22 DIAGNOSIS — N644 Mastodynia: Secondary | ICD-10-CM

## 2014-07-22 DIAGNOSIS — N63 Unspecified lump in unspecified breast: Secondary | ICD-10-CM

## 2014-07-22 DIAGNOSIS — N6452 Nipple discharge: Secondary | ICD-10-CM

## 2014-07-22 HISTORY — PX: BREAST BIOPSY: SHX20

## 2014-08-07 ENCOUNTER — Telehealth: Payer: Self-pay | Admitting: Internal Medicine

## 2014-08-07 ENCOUNTER — Other Ambulatory Visit: Payer: Medicare PPO

## 2014-08-07 DIAGNOSIS — E1122 Type 2 diabetes mellitus with diabetic chronic kidney disease: Secondary | ICD-10-CM

## 2014-08-07 DIAGNOSIS — N183 Chronic kidney disease, stage 3 unspecified: Secondary | ICD-10-CM

## 2014-08-07 NOTE — Telephone Encounter (Signed)
[name] dropped off a form for diabetic shoes. I placed the form in the rx tray on 08/07/2014.

## 2014-08-08 LAB — COMPREHENSIVE METABOLIC PANEL
ALT: 17 IU/L (ref 0–32)
AST: 19 IU/L (ref 0–40)
Albumin/Globulin Ratio: 1.1 (ref 1.1–2.5)
Albumin: 3.5 g/dL (ref 3.5–4.8)
Alkaline Phosphatase: 84 IU/L (ref 39–117)
BUN/Creatinine Ratio: 17 (ref 11–26)
BUN: 21 mg/dL (ref 8–27)
Bilirubin Total: <0.2 mg/dL (ref 0.0–1.2)
CO2: 23 mmol/L (ref 18–29)
Calcium: 8.5 mg/dL — ABNORMAL LOW (ref 8.7–10.3)
Chloride: 99 mmol/L (ref 97–108)
Creatinine, Ser: 1.23 mg/dL — ABNORMAL HIGH (ref 0.57–1.00)
GFR calc Af Amer: 49 mL/min/{1.73_m2} — ABNORMAL LOW (ref >59)
GFR calc non Af Amer: 42 mL/min/{1.73_m2} — ABNORMAL LOW (ref >59)
Globulin, Total: 3.3 g/dL (ref 1.5–4.5)
Glucose: 169 mg/dL — ABNORMAL HIGH (ref 65–99)
Potassium: 4.6 mmol/L (ref 3.5–5.2)
Sodium: 139 mmol/L (ref 134–144)
Total Protein: 6.8 g/dL (ref 6.0–8.5)

## 2014-08-08 LAB — HEMOGLOBIN A1C
Est. average glucose Bld gHb Est-mCnc: 229 mg/dL
Hgb A1c MFr Bld: 9.6 % — ABNORMAL HIGH (ref 4.8–5.6)

## 2014-08-10 ENCOUNTER — Other Ambulatory Visit: Payer: Self-pay | Admitting: Surgery

## 2014-08-10 ENCOUNTER — Ambulatory Visit (INDEPENDENT_AMBULATORY_CARE_PROVIDER_SITE_OTHER): Payer: Medicare PPO | Admitting: *Deleted

## 2014-08-10 ENCOUNTER — Telehealth: Payer: Self-pay

## 2014-08-10 DIAGNOSIS — Z5181 Encounter for therapeutic drug level monitoring: Secondary | ICD-10-CM

## 2014-08-10 DIAGNOSIS — E1165 Type 2 diabetes mellitus with hyperglycemia: Principal | ICD-10-CM

## 2014-08-10 DIAGNOSIS — Z7901 Long term (current) use of anticoagulants: Secondary | ICD-10-CM | POA: Diagnosis not present

## 2014-08-10 DIAGNOSIS — E1122 Type 2 diabetes mellitus with diabetic chronic kidney disease: Secondary | ICD-10-CM

## 2014-08-10 DIAGNOSIS — I2699 Other pulmonary embolism without acute cor pulmonale: Secondary | ICD-10-CM | POA: Diagnosis not present

## 2014-08-10 DIAGNOSIS — IMO0002 Reserved for concepts with insufficient information to code with codable children: Secondary | ICD-10-CM

## 2014-08-10 LAB — POCT INR: INR: 2

## 2014-08-10 MED ORDER — INSULIN GLARGINE 300 UNIT/ML ~~LOC~~ SOPN: 100.0000 [IU] | PEN_INJECTOR | Freq: Every day | SUBCUTANEOUS | Status: DC

## 2014-08-10 MED ORDER — INSULIN LISPRO 200 UNIT/ML ~~LOC~~ SOPN: 30.0000 [IU] | PEN_INJECTOR | Freq: Two times a day (BID) | SUBCUTANEOUS | Status: DC

## 2014-08-10 NOTE — Telephone Encounter (Signed)
-----   Message from Tivis Ringer, Donnelly sent at 08/10/2014  8:42 AM EDT ----- Not much change.  Increase Humalog to 30 units with meals.

## 2014-08-10 NOTE — Telephone Encounter (Signed)
Discussed with [name] (patient's sister), verbalized understanding of results. RX sent to pharmacy per [name]'s request. Copy of labs mailed

## 2014-08-10 NOTE — Telephone Encounter (Signed)
Printed OV notes and left in folder for Dr. Bubba Camp to fill out Detailed Order/Prescription for Therapeutic Footwear through [facility]. Angelyn Cox, Account Manager # [phone] Folder placed in Dr. Jackolyn Confer mailbox for signature and review.

## 2014-08-11 ENCOUNTER — Ambulatory Visit: Payer: Medicare PPO | Admitting: Internal Medicine

## 2014-08-11 ENCOUNTER — Other Ambulatory Visit: Payer: Self-pay | Admitting: Internal Medicine

## 2014-08-12 ENCOUNTER — Encounter: Payer: Self-pay | Admitting: Cardiovascular Disease

## 2014-08-12 ENCOUNTER — Ambulatory Visit (INDEPENDENT_AMBULATORY_CARE_PROVIDER_SITE_OTHER): Payer: Medicare PPO | Admitting: Cardiovascular Disease

## 2014-08-12 VITALS — BP 136/80 | HR 86 | Ht 67.0 in | Wt 359.0 lb

## 2014-08-12 DIAGNOSIS — Z1239 Encounter for other screening for malignant neoplasm of breast: Secondary | ICD-10-CM | POA: Diagnosis not present

## 2014-08-12 DIAGNOSIS — E785 Hyperlipidemia, unspecified: Secondary | ICD-10-CM

## 2014-08-12 DIAGNOSIS — Z86711 Personal history of pulmonary embolism: Secondary | ICD-10-CM

## 2014-08-12 DIAGNOSIS — I2510 Atherosclerotic heart disease of native coronary artery without angina pectoris: Secondary | ICD-10-CM

## 2014-08-12 DIAGNOSIS — I2583 Coronary atherosclerosis due to lipid rich plaque: Secondary | ICD-10-CM

## 2014-08-12 DIAGNOSIS — Z01818 Encounter for other preprocedural examination: Secondary | ICD-10-CM

## 2014-08-12 NOTE — Patient Instructions (Signed)
Dr. Claiborne Billings has cleared you to have your breast surgery.  Your physician recommends that you schedule a follow-up appointment with Dr. Meda Coffee as planned.

## 2014-08-13 ENCOUNTER — Other Ambulatory Visit: Payer: Self-pay | Admitting: *Deleted

## 2014-08-13 DIAGNOSIS — G894 Chronic pain syndrome: Secondary | ICD-10-CM

## 2014-08-13 MED ORDER — OXYCODONE-ACETAMINOPHEN 10-325 MG PO TABS: ORAL_TABLET | ORAL | Status: DC

## 2014-08-13 NOTE — Telephone Encounter (Signed)
Patient Requested and printed, patient will pick up

## 2014-08-14 ENCOUNTER — Ambulatory Visit: Payer: Medicare PPO | Admitting: Internal Medicine

## 2014-08-16 ENCOUNTER — Encounter: Payer: Self-pay | Admitting: Cardiovascular Disease

## 2014-08-16 DIAGNOSIS — Z01818 Encounter for other preprocedural examination: Secondary | ICD-10-CM | POA: Insufficient documentation

## 2014-08-16 NOTE — Progress Notes (Signed)
Patient ID: [name], female   DOB: [birth year], 80 y.o.   MRN: [id number]     HPI: [name] is a 80 y.o. female who presents to the office today for surgical clearance prior to under going breast surgery.  [name] is a 80 year old African-American female who has seen Dr. Jolly Mango in the past for a cardiology issues.  She has a history of morbid obesity, obstructive sleep apnea, and in 2004 suffered an inferior wall myocardial infarction which time she underwent insertion of a bare metal stent to her RCA.  In 2012, a DES stent was placed to her RCA.  In 2013.  She developed a pulmonary embolism and has been on chronic Coumadin therapy since that time.  In February 2016, she developed increasing episodes of exertional shortness of breath.  A nuclear perfusion study was interpreted as high risk of inferior and anteroapical ischemia.  Her Coumadin was held and she was referred to me to undergo cardiac catheterization.  This was done on 06/29/2014 and showed normal global LV function with mild mid inferior hypocontractility.  There was evidence for coronary calcification with smooth 20% proximal narrowing followed by 30% stenosis before the first diagonal branch, 50-60% stenosis in the proximal diagonal 1 vessel, 30% mid LAD smooth narrowing, and20% smooth proximal left circumflex stenosis.  Her RCA stents were widely patent.  She was recently found to have a left breast mass.  There were dilated ducts and areas consistent with possible papillomas.  An ultrasound-guided biopsy was not definitive.  She is now under consideration for open incisional biopsy of her abnormal area and is referred for preoperative cardiology clearance.  She denies recent chest pain.  She denies palpitations.  She denies PND, orthopnea.  Past Medical History  Diagnosis Date  . COPD (chronic obstructive pulmonary disease)     patient denies this  . Diabetes mellitus   . Diverticular disease   . Hypertension    . Hyperlipidemia   . Coronary artery disease     a. s/p IMI 2004 tx with BMS to RCA;  b. s/p Promus DES to RCA 2/12 (cath: LM 20-30%, pLAD 20-30%, mLAD 50%, RI 30%, pRCA 60%, mRCA 90% - tx with PCI);  c. myoview 1/07: EF 49%, inf scar, no isch  . Urinary incontinence   . Insomnia   . Osteoarthritis   . Morbid obesity   . Obstructive sleep apnea   . Polymyalgia rheumatica   . Anxiety   . Arthritis   . Depression   . Cholelithiasis   . CHF (congestive heart failure)   . GERD (gastroesophageal reflux disease)   . Pulmonary emboli 9/13    felt to need lifelong anticoagulation  . Debility   . Unspecified constipation   . Allergy   . Gout, unspecified   . Dysuria   . Urinary complications   . Pain, chronic   . Allergic rhinitis due to pollen   . Osteoarthrosis, unspecified whether generalized or localized, lower leg   . Type II or unspecified type diabetes mellitus without mention of complication, uncontrolled   . Anemia, unspecified   . Coronary atherosclerosis of native coronary artery   . Other malaise and fatigue     Past Surgical History  Procedure Laterality Date  . Coronary artery bypass graft    . Appendectomy    . Cardiac catheterization    . Gastric bypass  1977     reversed in 1979, [facility]  . Blood clots/legs  and lungs  2013  . Mi with stent placement  2004  . Left heart catheterization with coronary angiogram N/A 06/29/2014    Procedure: LEFT HEART CATHETERIZATION WITH CORONARY ANGIOGRAM;  Surgeon: Troy Sine, MD;  Location: [facility] CATH LAB;  Service: Cardiovascular;  Laterality: N/A;    Allergies  Allergen Reactions  . Sulfonamide Derivatives Swelling    To mouth    Current Outpatient Prescriptions  Medication Sig Dispense Refill  . acetaminophen (TYLENOL) 325 MG tablet Take 2 tablets (650 mg total) by mouth every 6 (six) hours as needed for mild pain (or Fever >/= 101).    [name] albuterol (PROVENTIL HFA;VENTOLIN HFA) 108 (90 BASE) MCG/ACT inhaler  Inhale 2 puffs into the lungs every 6 (six) hours as needed for wheezing or shortness of breath. 1 Inhaler 0  . CINNAMON PO Take by mouth. 1 by mouth twice daily    . clonazePAM (KLONOPIN) 1 MG tablet TAKE 1 TABLET BY MOUTH EVERY MORNING AND 2 TABLETS AT BEDTIME FOR SLEEP 90 tablet 0  . colchicine (COLCRYS) 0.6 MG tablet Take 1 tablet by mouth every day for gout 30 tablet 3  . diphenhydrAMINE (BENADRYL) 25 MG tablet Take 50 mg by mouth at bedtime as needed for itching.    . DULoxetine (CYMBALTA) 60 MG capsule Take 1 capsule by mouth every day for anxiety 90 capsule 1  . furosemide (LASIX) 20 MG tablet Take one tablet by mouth twice daily for edema 180 tablet 1  . glucose blood (FREESTYLE TEST STRIPS) test strip Use to test blood sugar twice daily. Dx E11.22 100 each 12  . guaiFENesin-dextromethorphan (ROBITUSSIN DM) 100-10 MG/5ML syrup Take 5 mLs by mouth every 4 (four) hours as needed for cough. 118 mL 0  . hydrocortisone cream 1 % Apply 1 application topically daily as needed for itching.    . Insulin Glargine (TOUJEO SOLOSTAR) 300 UNIT/ML SOPN Inject 100 Units into the skin daily. (Patient taking differently: Inject 110 Units into the skin daily. ) 15 mL 6  . Insulin Lispro, Human, (HUMALOG KWIKPEN) 200 UNIT/ML SOPN Inject 30 Units into the skin 2 (two) times daily with a meal. 12 mL 6  . isosorbide mononitrate (IMDUR) 60 MG 24 hr tablet Take 1 tablet (60 mg total) by mouth daily. 90 tablet 1  . Linaclotide (LINZESS) 145 MCG CAPS capsule Take 1 capsule (145 mcg total) by mouth daily. Sample provided 30 capsule 3  . metoprolol succinate (TOPROL-XL) 25 MG 24 hr tablet Take 1 tablet by mouth once every day for blood pressure 90 tablet 1  . nitroGLYCERIN (NITROSTAT) 0.4 MG SL tablet Take one tablet under the tongue every 5 minutes as needed for chest pain 50 tablet 0  . pantoprazole (PROTONIX) 40 MG tablet TAKE ONE TABLET BY MOUTH ONCE DAILY 90 tablet 1  . Phenylephrine-APAP-Guaifenesin (MUCINEX  SINUS-MAX CONGESTION) 5-325-200 MG TABS Take 1 tablet by mouth daily as needed (congestion).     . pravastatin (PRAVACHOL) 20 MG tablet Take 1 tablet by mouth once every day for cholesterol 90 tablet 1  . warfarin (COUMADIN) 5 MG tablet Take 2 tablets (10 mg total) by mouth daily. Please take 10 mg oral daily today, he stands Thursday, follow with INR clinic Friday morning, have your level checked, and your dose adjusted. (Patient taking differently: Take 10 mg by mouth daily. Please take 7 mg oral daily on Tues, Thursday, Sat, then 5 mg on Mon, Wed Fri, Sun.)  1  . oxyCODONE-acetaminophen (PERCOCET) 10-325 MG  per tablet Take one tablet every 6 hours as needed for pain 120 tablet 0   No current facility-administered medications for this visit.    History   Social History  . Marital Status: Widowed    Spouse Name: N/A  . Number of Children: N/A  . Years of Education: N/A   Occupational History  . retired    Social History Main Topics  . Smoking status: Never Smoker   . Smokeless tobacco: Not on file  . Alcohol Use: No  . Drug Use: No  . Sexual Activity: Not Currently   Other Topics Concern  . Not on file   Social History Narrative    Family History  Problem Relation Age of Onset  . Breast cancer Mother   . Heart disease Mother   . Throat cancer Father   . Hypertension Father   . Arthritis Father   . Diabetes Father   . Arthritis Sister   . Obesity Sister   . Diabetes Sister     ROS General: Negative; No fevers, chills, or night sweats.  Positive for supermorbid obesity HEENT: Negative; No changes in vision or hearing, sinus congestion, difficulty swallowing Pulmonary: Negative; No cough, wheezing, shortness of breath, hemoptysis Cardiovascular: See HPI: No chest pain, presyncope, syncope, palpatations GI: Negative; No nausea, vomiting, diarrhea, or abdominal pain GU: Negative; No dysuria, hematuria, or difficulty voiding Musculoskeletal: Negative; no myalgias, joint  pain, or weakness Hematologic: Negative; no easy bruising, bleeding Endocrine: Positive for nipple discharge Neuro: Negative; no changes in balance, headaches Skin: Negative; No rashes or skin lesions Psychiatric: Negative; No behavioral problems, depression Sleep: Negative; No snoring,  daytime sleepiness, hypersomnolence, bruxism, restless legs, hypnogognic hallucinations. Other comprehensive 14 point system review is negative   Physical Exam BP 136/80 mmHg  Pulse 86  Ht 5' 7" (1.702 m)  Wt 359 lb (162.841 kg)  BMI 56.21 kg/m2 General: Alert, oriented, no distress.  Skin: normal turgor, no rashes, warm and dry HEENT: Normocephalic, atraumatic. Pupils equal round and reactive to light; sclera anicteric; extraocular muscles intact, No lid lag; Nose without nasal septal hypertrophy; Mouth/Parynx benign; Mallinpatti scale 3 Neck: No JVD, no carotid bruits; normal carotid upstroke Lungs: clear to ausculatation and percussion bilaterally; no wheezing or rales, normal inspiratory and expiratory effort Chest wall: without tenderness to palpitation Heart: PMI not displaced, RRR, s1 s2 normal, 1/6systolic murmur, No diastolic murmur, no rubs, gallops, thrills, or heaves Abdomen: Significant central adiposity ;soft, nontender; no hepatosplenomehaly, BS+; abdominal aorta nontender and not dilated by palpation. Back: no CVA tenderness Pulses: 2+  Musculoskeletal: full range of motion, normal strength, no joint deformities Extremities: Pulses 2+, no clubbing cyanosis or edema, Homan's sign negative  Neurologic: grossly nonfocal; Cranial nerves grossly wnl Psychologic: Normal mood and affect   ECG (independently read by me): Normal sinus rhythm at 86 bpm.  Nondiagnostic T-wave changes in leads I and aVL.  LABS:  BMP Latest Ref Rng 08/07/2014 07/08/2014 06/30/2014  Glucose 65 - 99 mg/dL 169(H) 188(H) 138(H)  BUN 8 - 27 mg/dL _0 Creatinine 0.57 - 1.00 mg/dL 1.23(H) 1.35(H) 1.09  BUN/Creat  Ratio 11 - _1 -  Sodium 134 - 144 mmol/L 139 139 138  Potassium 3.5 - 5.2 mmol/L 4.6 4.9 4.5  Chloride 97 - 108 mmol/L 99 98 110  CO2 18 - 29 mmol/L _2 Calcium 8.7 - 10.3 mg/dL 8.5(L) 8.8 8.2(L)     Hepatic Function Latest Ref Rng 08/07/2014 06/04/2014 04/22/2014  Total Protein 6.0 - 8.5 g/dL 6.8 7.4 7.3  Albumin 3.5 - 5.2 g/dL - - 2.7(L)  AST 0 - 40 IU/L _0 ALT 0 - 32 IU/L _1 Alk Phosphatase 39 - 117 IU/L 84 92 105  Total Bilirubin 0.0 - 1.2 mg/dL <0.2 0.3 0.4  Bilirubin, Direct 0.0-0.3 mg/dL - - -    CBC Latest Ref Rng 07/08/2014 06/30/2014 06/29/2014  WBC 3.4 - 10.8 x10E3/uL 8.2 9.3 10.2  Hemoglobin 11.1 - 15.9 g/dL 10.6(L) 9.1(L) 8.9(L)  Hematocrit 34.0 - 46.6 % 33.4(L) 29.4(L) 28.4(L)  Platelets 150 - 379 x10E3/uL 312 253 248   Lab Results  Component Value Date   MCV 85 07/08/2014   MCV 89.6 06/30/2014   MCV 87.9 06/29/2014    Lab Results  Component Value Date   TSH 0.733 06/25/2014    BNP    Component Value Date/Time   BNP 62.6 06/24/2014 1821    ProBNP    Component Value Date/Time   PROBNP 150.2 10/14/2013 1644     Lipid Panel     Component Value Date/Time   CHOL 171 06/04/2014 1017   CHOL 136 05/13/2012   TRIG 225* 06/04/2014 1017   HDL 41 06/04/2014 1017   HDL 39 05/13/2012   CHOLHDL 4.2 06/04/2014 1017   CHOLHDL 3.0 06/22/2010 0355   VLDL 28 06/22/2010 0355   LDLCALC 85 06/04/2014 1017   LDLCALC 65 05/13/2012     RADIOLOGY: US Breast Ltd Uni Left Inc Axilla  07/22/2014   CLINICAL DATA:  Patient was recommended for biopsied in November of 2015 but the patient never came for her biopsy. Patient also the complains of diffuse right breast pain.  EXAM: DIGITAL DIAGNOSTIC BILATERAL MAMMOGRAM WITH 3D TOMOSYNTHESIS WITH CAD  ULTRASOUND LEFT BREAST  COMPARISON:  March 24, 2014, February 10, 2013, January 15, 2013  ACR Breast Density Category b: There are scattered areas of fibroglandular density.  FINDINGS: Cc and MLO views  of bilateral breasts are submitted. The right breast is negative. In the left breast, there are indeterminate microcalcifications, some in linear fashion increased compared to prior exam in the retroareolar left breast spanning 9.7 x 5 x 3.8 cm.  Mammographic images were processed with CAD.  Targeted ultrasound is performed, showing intraductal masses at the left breast subareolar 3 o'clock with the masses measuring approximately 5 to 6 mm.  IMPRESSION: Suspicious findings.  RECOMMENDATION: Ultrasound-guided core biopsy intraductal masses. The patient is above the weight limit for stereotactic biopsy 10 probably above the weight limit for MRI of the breast.  I have discussed the findings and recommendations with the patient. Results were also provided in writing at the conclusion of the visit. If applicable, a reminder letter will be sent to the patient regarding the next appointment.  BI-RADS CATEGORY  4: Suspicious.   Electronically Signed   By: Abelardo Diesel M.D.   On: 07/22/2014 15:02   Mm Diag Breast Tomo Uni Left  07/22/2014   CLINICAL DATA:  Status post ultrasound-guided core biopsy left breast intraductal mass  EXAM: DIAGNOSTIC LEFT MAMMOGRAM POST ULTRASOUND BIOPSY  COMPARISON:  Previous exam(s).  FINDINGS: Mammographic images were obtained following left breast ultrasound guided biopsy of intraductal masses at 3 o'clock. Cc and lateral views of the left breast demonstrate ribbon biopsy clip in the area of concern.  IMPRESSION: Post biopsy mammogram demonstrating biopsy clip in the area concern.  Final Assessment: Post Procedure Mammograms for Marker Placement   Electronically Signed  By: Abelardo Diesel M.D.   On: 07/22/2014 15:04   Mm Diag Breast Tomo Bilateral  07/22/2014   CLINICAL DATA:  Patient was recommended for biopsied in November of 2015 but the patient never came for her biopsy. Patient also the complains of diffuse right breast pain.  EXAM: DIGITAL DIAGNOSTIC BILATERAL MAMMOGRAM WITH 3D  TOMOSYNTHESIS WITH CAD  ULTRASOUND LEFT BREAST  COMPARISON:  March 24, 2014, February 10, 2013, January 15, 2013  ACR Breast Density Category b: There are scattered areas of fibroglandular density.  FINDINGS: Cc and MLO views of bilateral breasts are submitted. The right breast is negative. In the left breast, there are indeterminate microcalcifications, some in linear fashion increased compared to prior exam in the retroareolar left breast spanning 9.7 x 5 x 3.8 cm.  Mammographic images were processed with CAD.  Targeted ultrasound is performed, showing intraductal masses at the left breast subareolar 3 o'clock with the masses measuring approximately 5 to 6 mm.  IMPRESSION: Suspicious findings.  RECOMMENDATION: Ultrasound-guided core biopsy intraductal masses. The patient is above the weight limit for stereotactic biopsy 10 probably above the weight limit for MRI of the breast.  I have discussed the findings and recommendations with the patient. Results were also provided in writing at the conclusion of the visit. If applicable, a reminder letter will be sent to the patient regarding the next appointment.  BI-RADS CATEGORY  4: Suspicious.   Electronically Signed   By: Abelardo Diesel M.D.   On: 07/22/2014 15:02   [place] Lt Breast Bx W Loc Dev 1st Lesion Img Bx Spec US Guide  07/24/2014   ADDENDUM REPORT: 07/24/2014 09:27  ADDENDUM: Pathology revealed breast parenchyma with dilated ducts in the left breast. This was found to be discordant by Dr. Abelardo Diesel. Pathology was discussed with the patient's daughter, Liz Malady, at the request of the patient. She reported that her mother had done well after the biopsy. Post biopsy instructions and care were reviewed and her questions were answered. Surgical consultation was arranged with Dr. Nedra Hai at [facility] on August 10, 2014. My number was provided for future questions and concerns.  Pathology results reported by Susa Raring RN, BSN on July 24, 2014.   Electronically Signed   By: Abelardo Diesel M.D.   On: 07/24/2014 09:27   07/24/2014   CLINICAL DATA:  Left breast intraductal masses.  EXAM: ULTRASOUND GUIDED LEFT BREAST CORE NEEDLE BIOPSY  COMPARISON:  Previous exam(s).  FINDINGS: I met with the patient and we discussed the procedure of ultrasound-guided biopsy, including benefits and alternatives. We discussed the high likelihood of a successful procedure. We discussed the risks of the procedure, including infection, bleeding, tissue injury, clip migration, and inadequate sampling. Informed written consent was given. The usual time-out protocol was performed immediately prior to the procedure.  Using sterile technique and 2% Lidocaine as local anesthetic, under direct ultrasound visualization, a 14 gauge spring-loaded device was used to perform biopsy of left breast 3 o'clock intraductal masses using a lateral approach. At the conclusion of the procedure a ribbon tissue marker clip was deployed into the biopsy cavity. Follow up 2 view mammogram was performed and dictated separately.  IMPRESSION: Ultrasound guided biopsy of left breast.  No apparent complications.  Electronically Signed: By: Abelardo Diesel M.D. On: 07/22/2014 14:39      ASSESSMENT AND PLAN: Ms. Tyreona Panjwani is a 80 year old African-American female who is in need for an open incisional biopsy of an abnormal left  breast mass after undergoing previous ultrasound-guided biopsy.  She has established cardiology disease and suffered an MI in 2003 and underwent successful stenting.  I reviewed her most recent cardiac catheterization with her in detail.  At this study did not reveal high-grade obstructive disease.  She is without anginal symptomatology.  Her LV function is normal and at catheterization ejection fraction was 55-60% with mild mid inferior hypocontractility.  I feel she is stable from a cardiovascular standpoint to undergo her planned open incisional left breast mass biopsy.   She will need to hold her Coumadin for approximate 4 days prior to the procedure.  Her blood pressure today is stable on her current therapy consisting of metoprolol 25 mg daily in addition to her isosorbide mononitrate 60 mg, furosemide 20 mg.  She is on pravastatin for hyperlipidemia and is tolerating this without significant myalgias.  Following her breast surgery, she will return to the primary cardiology care of Dr. Meda Coffee.     Troy Sine, MD, [name]  08/16/2014 11:31 AM

## 2014-08-28 ENCOUNTER — Other Ambulatory Visit: Payer: Self-pay | Admitting: Surgery

## 2014-08-28 DIAGNOSIS — N6320 Unspecified lump in the left breast, unspecified quadrant: Secondary | ICD-10-CM

## 2014-09-01 ENCOUNTER — Other Ambulatory Visit: Payer: Self-pay | Admitting: Surgery

## 2014-09-01 DIAGNOSIS — N6320 Unspecified lump in the left breast, unspecified quadrant: Secondary | ICD-10-CM

## 2014-09-02 ENCOUNTER — Encounter (HOSPITAL_COMMUNITY): Payer: Self-pay | Admitting: Certified Registered"

## 2014-09-03 ENCOUNTER — Other Ambulatory Visit: Payer: Medicare PPO

## 2014-09-03 DIAGNOSIS — N186 End stage renal disease: Principal | ICD-10-CM

## 2014-09-03 DIAGNOSIS — E1122 Type 2 diabetes mellitus with diabetic chronic kidney disease: Secondary | ICD-10-CM

## 2014-09-04 ENCOUNTER — Encounter: Payer: Self-pay | Admitting: Internal Medicine

## 2014-09-04 ENCOUNTER — Ambulatory Visit (INDEPENDENT_AMBULATORY_CARE_PROVIDER_SITE_OTHER): Payer: Medicare PPO | Admitting: Internal Medicine

## 2014-09-04 VITALS — BP 140/82 | HR 79 | Temp 97.8°F | Resp 20 | Ht 67.0 in | Wt 370.2 lb

## 2014-09-04 DIAGNOSIS — R0609 Other forms of dyspnea: Secondary | ICD-10-CM

## 2014-09-04 DIAGNOSIS — I10 Essential (primary) hypertension: Secondary | ICD-10-CM | POA: Diagnosis not present

## 2014-09-04 DIAGNOSIS — F4321 Adjustment disorder with depressed mood: Secondary | ICD-10-CM | POA: Diagnosis not present

## 2014-09-04 DIAGNOSIS — Z7901 Long term (current) use of anticoagulants: Secondary | ICD-10-CM

## 2014-09-04 DIAGNOSIS — R0600 Dyspnea, unspecified: Secondary | ICD-10-CM

## 2014-09-04 DIAGNOSIS — R928 Other abnormal and inconclusive findings on diagnostic imaging of breast: Secondary | ICD-10-CM | POA: Diagnosis not present

## 2014-09-04 DIAGNOSIS — M159 Polyosteoarthritis, unspecified: Secondary | ICD-10-CM

## 2014-09-04 DIAGNOSIS — E1122 Type 2 diabetes mellitus with diabetic chronic kidney disease: Secondary | ICD-10-CM | POA: Diagnosis not present

## 2014-09-04 DIAGNOSIS — J449 Chronic obstructive pulmonary disease, unspecified: Secondary | ICD-10-CM | POA: Diagnosis not present

## 2014-09-04 DIAGNOSIS — E1165 Type 2 diabetes mellitus with hyperglycemia: Secondary | ICD-10-CM

## 2014-09-04 DIAGNOSIS — M353 Polymyalgia rheumatica: Secondary | ICD-10-CM | POA: Diagnosis not present

## 2014-09-04 DIAGNOSIS — M150 Primary generalized (osteo)arthritis: Secondary | ICD-10-CM

## 2014-09-04 DIAGNOSIS — N189 Chronic kidney disease, unspecified: Secondary | ICD-10-CM

## 2014-09-04 DIAGNOSIS — I5032 Chronic diastolic (congestive) heart failure: Secondary | ICD-10-CM

## 2014-09-04 DIAGNOSIS — M8949 Other hypertrophic osteoarthropathy, multiple sites: Secondary | ICD-10-CM

## 2014-09-04 DIAGNOSIS — IMO0002 Reserved for concepts with insufficient information to code with codable children: Secondary | ICD-10-CM

## 2014-09-04 LAB — COMPREHENSIVE METABOLIC PANEL
ALT: 16 IU/L (ref 0–32)
AST: 17 IU/L (ref 0–40)
Albumin/Globulin Ratio: 0.9 — ABNORMAL LOW (ref 1.1–2.5)
Albumin: 3.5 g/dL (ref 3.5–4.8)
Alkaline Phosphatase: 96 IU/L (ref 39–117)
BUN/Creatinine Ratio: 17 (ref 11–26)
BUN: 20 mg/dL (ref 8–27)
Bilirubin Total: 0.2 mg/dL (ref 0.0–1.2)
CO2: 22 mmol/L (ref 18–29)
Calcium: 8.7 mg/dL (ref 8.7–10.3)
Chloride: 99 mmol/L (ref 97–108)
Creatinine, Ser: 1.19 mg/dL — ABNORMAL HIGH (ref 0.57–1.00)
GFR calc Af Amer: 51 mL/min/{1.73_m2} — ABNORMAL LOW (ref >59)
GFR calc non Af Amer: 44 mL/min/{1.73_m2} — ABNORMAL LOW (ref >59)
Globulin, Total: 3.7 g/dL (ref 1.5–4.5)
Glucose: 258 mg/dL — ABNORMAL HIGH (ref 65–99)
Potassium: 4.6 mmol/L (ref 3.5–5.2)
Sodium: 139 mmol/L (ref 134–144)
Total Protein: 7.2 g/dL (ref 6.0–8.5)

## 2014-09-04 LAB — HEMOGLOBIN A1C
Est. average glucose Bld gHb Est-mCnc: 260 mg/dL
Hgb A1c MFr Bld: 10.7 % — ABNORMAL HIGH (ref 4.8–5.6)

## 2014-09-04 MED ORDER — WARFARIN SODIUM 5 MG PO TABS: 10.0000 mg | ORAL_TABLET | Freq: Every day | ORAL | Status: DC

## 2014-09-04 MED ORDER — ALBUTEROL SULFATE HFA 108 (90 BASE) MCG/ACT IN AERS: 2.0000 | INHALATION_SPRAY | Freq: Four times a day (QID) | RESPIRATORY_TRACT | Status: DC | PRN

## 2014-09-04 MED ORDER — INSULIN PEN NEEDLE 31G X 5 MM MISC: Status: DC

## 2014-09-04 MED ORDER — FUROSEMIDE 20 MG PO TABS: ORAL_TABLET | ORAL | Status: DC

## 2014-09-04 NOTE — Progress Notes (Signed)
Patient ID: [name], female   DOB: 1934/06/27, 80 y.o.   MRN: [id number]    Location:    [name]    Place of Service:   OFFICE   Chief Complaint  Patient presents with  . Medical Management of Chronic Issues    3 month follow-up,discuss labs (copy printed )    HPI:  80 yo female seen today for f/u. She is a former pt of Dr Bubba Camp. She has several upcoming Dr appts for heart. She had left breast abnormal mammogram and has had a bx next week. She will see diabetic educator next week. She is taking insulin as directed. Scheduled to see GYN after her breast bx to have vaginal polyp removed.  She takes coumadin for PE. No recent bledding episodes.  She has increased wheezing with SOB on exertion. She has cough with green sputum pdtn x several mos in AM when she awakens. She has hx seasonal allergy. She takes claritin prn.  She needs a Rx for light weight wheelchair as her current one locks up easily. Call [facility] at [phone], option 2, [id number])  She feels depressed due to lack of family support. Her granddaughter is present today.  Past Medical History  Diagnosis Date  . COPD (chronic obstructive pulmonary disease)     patient denies this  . Diabetes mellitus   . Diverticular disease   . Hypertension   . Hyperlipidemia   . Coronary artery disease     a. s/p IMI 2004 tx with BMS to RCA;  b. s/p Promus DES to RCA 2/12 (cath: LM 20-30%, pLAD 20-30%, mLAD 50%, RI 30%, pRCA 60%, mRCA 90% - tx with PCI);  c. myoview 1/07: EF 49%, inf scar, no isch  . Urinary incontinence   . Insomnia   . Osteoarthritis   . Morbid obesity   . Obstructive sleep apnea   . Polymyalgia rheumatica   . Anxiety   . Arthritis   . Depression   . Cholelithiasis   . CHF (congestive heart failure)   . GERD (gastroesophageal reflux disease)   . Pulmonary emboli 9/13    felt to need lifelong anticoagulation  . Debility   . Unspecified constipation   . Allergy   . Gout, unspecified   . Dysuria   .  Urinary complications   . Pain, chronic   . Allergic rhinitis due to pollen   . Osteoarthrosis, unspecified whether generalized or localized, lower leg   . Type II or unspecified type diabetes mellitus without mention of complication, uncontrolled   . Anemia, unspecified   . Coronary atherosclerosis of native coronary artery   . Other malaise and fatigue     Past Surgical History  Procedure Laterality Date  . Coronary artery bypass graft    . Appendectomy    . Cardiac catheterization    . Gastric bypass  1977     reversed in 1979, [facility]  . Blood clots/legs and lungs  2013  . Mi with stent placement  2004  . Left heart catheterization with coronary angiogram N/A 06/29/2014    Procedure: LEFT HEART CATHETERIZATION WITH CORONARY ANGIOGRAM;  Surgeon: Troy Sine, MD;  Location: [facility] CATH LAB;  Service: Cardiovascular;  Laterality: N/A;    Patient Care Team: Gildardo Cranker, DO as PCP - General (Internal Medicine) Renella Cunas, MD as Consulting Physician (Cardiology) [name], MD as Consulting Physician (Ophthalmology) [name], MD as Consulting Physician (General Surgery)  History   Social History  .  Marital Status: Widowed    Spouse Name: N/A  . Number of Children: N/A  . Years of Education: N/A   Occupational History  . retired    Social History Main Topics  . Smoking status: Never Smoker   . Smokeless tobacco: Not on file  . Alcohol Use: No  . Drug Use: No  . Sexual Activity: Not Currently   Other Topics Concern  . Not on file   Social History Narrative     reports that she has never smoked. She does not have any smokeless tobacco history on file. She reports that she does not drink alcohol or use illicit drugs.  Allergies  Allergen Reactions  . Sulfonamide Derivatives Swelling    To mouth    Medications: Patient's Medications  New Prescriptions   No medications on file  Previous Medications   ACETAMINOPHEN (TYLENOL) 325 MG TABLET     Take 2 tablets (650 mg total) by mouth every 6 (six) hours as needed for mild pain (or Fever >/= 101).   ALBUTEROL (PROVENTIL HFA;VENTOLIN HFA) 108 (90 BASE) MCG/ACT INHALER    Inhale 2 puffs into the lungs every 6 (six) hours as needed for wheezing or shortness of breath.   CINNAMON PO    Take by mouth. 1 by mouth twice daily   CLONAZEPAM (KLONOPIN) 1 MG TABLET    TAKE 1 TABLET BY MOUTH EVERY MORNING AND 2 TABLETS AT BEDTIME FOR SLEEP   COLCHICINE (COLCRYS) 0.6 MG TABLET    Take 1 tablet by mouth every day for gout   DIPHENHYDRAMINE (BENADRYL) 25 MG TABLET    Take 50 mg by mouth at bedtime as needed for itching.   DULOXETINE (CYMBALTA) 60 MG CAPSULE    Take 1 capsule by mouth every day for anxiety   FUROSEMIDE (LASIX) 20 MG TABLET    Take one tablet by mouth twice daily for edema   GLUCOSE BLOOD (FREESTYLE TEST STRIPS) TEST STRIP    Use to test blood sugar twice daily. Dx E11.22   GUAIFENESIN-DEXTROMETHORPHAN (ROBITUSSIN DM) 100-10 MG/5ML SYRUP    Take 5 mLs by mouth every 4 (four) hours as needed for cough.   HYDROCORTISONE CREAM 1 %    Apply 1 application topically daily as needed for itching.   INSULIN GLARGINE (TOUJEO SOLOSTAR) 300 UNIT/ML SOPN    Inject 100 Units into the skin daily.   INSULIN LISPRO, HUMAN, (HUMALOG KWIKPEN) 200 UNIT/ML SOPN    Inject 30 Units into the skin 2 (two) times daily with a meal.   ISOSORBIDE MONONITRATE (IMDUR) 60 MG 24 HR TABLET    Take 1 tablet (60 mg total) by mouth daily.   LINACLOTIDE (LINZESS) 145 MCG CAPS CAPSULE    Take 1 capsule (145 mcg total) by mouth daily. Sample provided   METOPROLOL SUCCINATE (TOPROL-XL) 25 MG 24 HR TABLET    Take 1 tablet by mouth once every day for blood pressure   NITROGLYCERIN (NITROSTAT) 0.4 MG SL TABLET    Take one tablet under the tongue every 5 minutes as needed for chest pain   OXYCODONE-ACETAMINOPHEN (PERCOCET) 10-325 MG PER TABLET    Take one tablet every 6 hours as needed for pain   PANTOPRAZOLE (PROTONIX) 40 MG TABLET     TAKE ONE TABLET BY MOUTH ONCE DAILY   PHENYLEPHRINE-APAP-GUAIFENESIN (MUCINEX SINUS-MAX CONGESTION) 5-325-200 MG TABS    Take 1 tablet by mouth daily as needed (congestion).    PRAVASTATIN (PRAVACHOL) 20 MG TABLET    Take 1  tablet by mouth once every day for cholesterol   WARFARIN (COUMADIN) 5 MG TABLET    Take 2 tablets (10 mg total) by mouth daily. Please take 10 mg oral daily today, he stands Thursday, follow with INR clinic Friday morning, have your level checked, and your dose adjusted.  Modified Medications   No medications on file  Discontinued Medications   No medications on file    Review of Systems  Constitutional: Negative for fever, chills, diaphoresis, activity change, appetite change and fatigue.  HENT: Negative for ear pain and sore throat.   Eyes: Negative for visual disturbance.  Respiratory: Positive for cough, chest tightness, shortness of breath and wheezing.   Cardiovascular: Positive for leg swelling. Negative for chest pain and palpitations.  Gastrointestinal: Negative for nausea, vomiting, abdominal pain, diarrhea, constipation and blood in stool.  Genitourinary: Negative for dysuria.  Musculoskeletal: Positive for back pain, joint swelling, arthralgias and gait problem.  Skin: Negative for rash.  Neurological: Negative for dizziness, tremors, numbness and headaches.  Psychiatric/Behavioral: Positive for dysphoric mood. Negative for sleep disturbance. The patient is not nervous/anxious.     Filed Vitals:   09/04/14 1540  BP: 140/82  Pulse: 79  Temp: 97.8 F (36.6 C)  TempSrc: Oral  Resp: 20  Height: 5\' 7"  (1.702 m)  Weight: 370 lb 3.2 oz (167.922 kg)  SpO2: 95%   Body mass index is 57.97 kg/(m^2).  Physical Exam  Constitutional: She is oriented to person, place, and time. She appears well-developed and well-nourished.  Sitting in w/c  HENT:  Mouth/Throat: Oropharynx is clear and moist. No oropharyngeal exudate.  Eyes: Pupils are equal, round, and  reactive to light. No scleral icterus.  Neck: Neck supple. No tracheal deviation present. No thyromegaly present.  Cardiovascular: Normal rate, regular rhythm and intact distal pulses.  Exam reveals no gallop and no friction rub.   Murmur (1/6 SEM) heard. +1 pitting LE edema b/l. no calf TTP. No carotid bruit b/l  Pulmonary/Chest: Effort normal. No stridor. No respiratory distress. She has decreased breath sounds. She has no wheezes. She has no rales.  Abdominal: Soft. Bowel sounds are normal. She exhibits no distension and no mass. There is no tenderness. There is no rebound and no guarding.  Musculoskeletal: She exhibits edema and tenderness.  B/l bunions with hammertoes. Right 1st toenail absent. Monofilament testing intact b/l  Lymphadenopathy:    She has no cervical adenopathy.  Neurological: She is alert and oriented to person, place, and time. She has normal reflexes.  Skin: Skin is warm and dry. No rash noted.  Psychiatric: Her behavior is normal. Judgment and thought content normal. She exhibits a depressed mood.     Labs reviewed: Lab on 09/03/2014  Component Date Value Ref Range Status  . Hgb A1c MFr Bld 09/03/2014 10.7* 4.8 - 5.6 % Final   Comment:          Pre-diabetes: 5.7 - 6.4          Diabetes: >6.4          Glycemic control for adults with diabetes: <7.0   . Est. average glucose Bld gHb Est-m* 09/03/2014 260   Final  . Glucose 09/03/2014 258* 65 - 99 mg/dL Final  . BUN 09/03/2014 20  8 - 27 mg/dL Final  . Creatinine, Ser 09/03/2014 1.19* 0.57 - 1.00 mg/dL Final  . GFR calc non Af Amer 09/03/2014 44* >59 mL/min/1.73 Final  . GFR calc Af Amer 09/03/2014 51* >59 mL/min/1.73 Final  .  BUN/Creatinine Ratio 09/03/2014 17  11 - 26 Final  . Sodium 09/03/2014 139  134 - 144 mmol/L Final  . Potassium 09/03/2014 4.6  3.5 - 5.2 mmol/L Final  . Chloride 09/03/2014 99  97 - 108 mmol/L Final  . CO2 09/03/2014 22  18 - 29 mmol/L Final  . Calcium 09/03/2014 8.7  8.7 - 10.3 mg/dL  Final  . Total Protein 09/03/2014 7.2  6.0 - 8.5 g/dL Final  . Albumin 09/03/2014 3.5  3.5 - 4.8 g/dL Final  . Globulin, Total 09/03/2014 3.7  1.5 - 4.5 g/dL Final  . Albumin/Globulin Ratio 09/03/2014 0.9* 1.1 - 2.5 Final  . Bilirubin Total 09/03/2014 0.2  0.0 - 1.2 mg/dL Final  . Alkaline Phosphatase 09/03/2014 96  39 - 117 IU/L Final  . AST 09/03/2014 17  0 - 40 IU/L Final  . ALT 09/03/2014 16  0 - 32 IU/L Final  Anti-coag visit on 08/10/2014  Component Date Value Ref Range Status  . INR 08/10/2014 2.0   Final  Appointment on 08/07/2014  Component Date Value Ref Range Status  . Hgb A1c MFr Bld 08/07/2014 9.6* 4.8 - 5.6 % Final   Comment:          Pre-diabetes: 5.7 - 6.4          Diabetes: >6.4          Glycemic control for adults with diabetes: <7.0   . Est. average glucose Bld gHb Est-m* 08/07/2014 229   Final  . Glucose 08/07/2014 169* 65 - 99 mg/dL Final  . BUN 08/07/2014 21  8 - 27 mg/dL Final  . Creatinine, Ser 08/07/2014 1.23* 0.57 - 1.00 mg/dL Final  . GFR calc non Af Amer 08/07/2014 42* >59 mL/min/1.73 Final  . GFR calc Af Amer 08/07/2014 49* >59 mL/min/1.73 Final  . BUN/Creatinine Ratio 08/07/2014 17  11 - 26 Final  . Sodium 08/07/2014 139  134 - 144 mmol/L Final  . Potassium 08/07/2014 4.6  3.5 - 5.2 mmol/L Final  . Chloride 08/07/2014 99  97 - 108 mmol/L Final  . CO2 08/07/2014 23  18 - 29 mmol/L Final  . Calcium 08/07/2014 8.5* 8.7 - 10.3 mg/dL Final  . Total Protein 08/07/2014 6.8  6.0 - 8.5 g/dL Final  . Albumin 08/07/2014 3.5  3.5 - 4.8 g/dL Final  . Globulin, Total 08/07/2014 3.3  1.5 - 4.5 g/dL Final  . Albumin/Globulin Ratio 08/07/2014 1.1  1.1 - 2.5 Final  . Bilirubin Total 08/07/2014 <0.2  0.0 - 1.2 mg/dL Final  . Alkaline Phosphatase 08/07/2014 84  39 - 117 IU/L Final  . AST 08/07/2014 19  0 - 40 IU/L Final  . ALT 08/07/2014 17  0 - 32 IU/L Final  Anti-coag visit on 07/13/2014  Component Date Value Ref Range Status  . INR 07/13/2014 2.1   Final  Office  Visit on 07/08/2014  Component Date Value Ref Range Status  . WBC 07/08/2014 8.2  3.4 - 10.8 x10E3/uL Final  . RBC 07/08/2014 3.93  3.77 - 5.28 x10E6/uL Final  . Hemoglobin 07/08/2014 10.6* 11.1 - 15.9 g/dL Final  . HCT 07/08/2014 33.4* 34.0 - 46.6 % Final  . MCV 07/08/2014 85  79 - 97 fL Final  . MCH 07/08/2014 27.0  26.6 - 33.0 pg Final  . MCHC 07/08/2014 31.7  31.5 - 35.7 g/dL Final  . RDW 07/08/2014 15.6* 12.3 - 15.4 % Final  . Platelets 07/08/2014 312  150 - 379 x10E3/uL Final  .  Neutrophils Relative % 07/08/2014 62   Final  . Lymphs 07/08/2014 28   Final  . Monocytes 07/08/2014 8   Final  . Eos 07/08/2014 2   Final  . Basos 07/08/2014 0   Final  . Neutrophils Absolute 07/08/2014 5.0  1.4 - 7.0 x10E3/uL Final  . Lymphocytes Absolute 07/08/2014 2.3  0.7 - 3.1 x10E3/uL Final  . Monocytes Absolute 07/08/2014 0.7  0.1 - 0.9 x10E3/uL Final  . Eosinophils Absolute 07/08/2014 0.2  0.0 - 0.4 x10E3/uL Final  . Basophils Absolute 07/08/2014 0.0  0.0 - 0.2 x10E3/uL Final  . Immature Granulocytes 07/08/2014 0   Final  . Immature Grans (Abs) 07/08/2014 0.0  0.0 - 0.1 x10E3/uL Final  . Glucose 07/08/2014 188* 65 - 99 mg/dL Final  . BUN 07/08/2014 22  8 - 27 mg/dL Final  . Creatinine, Ser 07/08/2014 1.35* 0.57 - 1.00 mg/dL Final  . GFR calc non Af Amer 07/08/2014 38* >59 mL/min/1.73 Final  . GFR calc Af Amer 07/08/2014 44* >59 mL/min/1.73 Final  . BUN/Creatinine Ratio 07/08/2014 16  11 - 26 Final  . Sodium 07/08/2014 139  134 - 144 mmol/L Final  . Potassium 07/08/2014 4.9  3.5 - 5.2 mmol/L Final  . Chloride 07/08/2014 98  97 - 108 mmol/L Final  . CO2 07/08/2014 26  18 - 29 mmol/L Final  . Calcium 07/08/2014 8.8  8.7 - 10.3 mg/dL Final  . Prolactin 07/08/2014 14.4  4.8 - 23.3 ng/mL Final  Anti-coag visit on 07/03/2014  Component Date Value Ref Range Status  . INR 07/03/2014 1.4   Final  Admission on 06/24/2014, Discharged on 06/30/2014  No results displayed because visit has over 200  results.      No results found.   Assessment/Plan    ICD-9-CM ICD-10-CM   1. Uncontrolled diabetes mellitus with chronic kidney disease - cont insulin; f/u for diabetes education 250.42 E11.22    585.9 E11.65     N18.9   2. Essential hypertension, benign - borderline controlled; cont meds 401.1 I10   3. Abnormal mammogram of left breast  793.80 R92.8   4. DOE (dyspnea on exertion) - due to #7 and #8; Rx prn HFA 786.09 R06.09   5. Polymyalgia rheumatica - pain stable on meds 725 M35.3   6. Primary osteoarthritis involving multiple joints - pain stable 715.09 M15.0   7. OBESITY, MORBID 278.01 E66.01   8. Chronic obstructive pulmonary disease, unspecified COPD, unspecified chronic bronchitis type - cont meds; Rx prn HFA 496 J44.9   9. Chronic diastolic congestive heart failure - cont meds 428.32 I50.32    428.0    10. Situational depression 309.0 F43.21   11. Long term current use of anticoagulant therapy V58.61 Z79.01    --Rx written for lightweight wheelchair for reasons #5, 6, and 8  --Recommend contact [employer] for [employer] patient assistance program and Social worker for [employer] patient assistance  --Continue current medications as ordered. Samples of toujeo and humalog kwikpen given. She was given GoodRx coupon card  --start prn HFA Proair for DOE  --Follow up in 3 months. Keep scheduled appointments  Celie Desrochers S. Perlie Gold  [facility] [address] [place], [place] [address] [phone] Cell (Monday-Friday 8 AM - 5 PM) [phone] After 5 PM and follow prompts

## 2014-09-04 NOTE — Patient Instructions (Signed)
Rx written for lightweight wheelchair  Recommend contact [employer] for [employer] patient assistance program and Social worker for [employer] patient assistance  Continue current medications as ordered  Follow up in 3 months. Keep scheduled appointments

## 2014-09-07 ENCOUNTER — Ambulatory Visit (INDEPENDENT_AMBULATORY_CARE_PROVIDER_SITE_OTHER): Payer: Medicare PPO | Admitting: Pharmacotherapy

## 2014-09-07 ENCOUNTER — Encounter: Payer: Self-pay | Admitting: Pharmacotherapy

## 2014-09-07 ENCOUNTER — Ambulatory Visit (INDEPENDENT_AMBULATORY_CARE_PROVIDER_SITE_OTHER): Payer: Medicare PPO | Admitting: Pharmacist

## 2014-09-07 VITALS — BP 132/84 | HR 79 | Temp 98.1°F | Wt 370.0 lb

## 2014-09-07 DIAGNOSIS — IMO0002 Reserved for concepts with insufficient information to code with codable children: Secondary | ICD-10-CM

## 2014-09-07 DIAGNOSIS — I10 Essential (primary) hypertension: Secondary | ICD-10-CM

## 2014-09-07 DIAGNOSIS — N189 Chronic kidney disease, unspecified: Secondary | ICD-10-CM

## 2014-09-07 DIAGNOSIS — I2699 Other pulmonary embolism without acute cor pulmonale: Secondary | ICD-10-CM | POA: Diagnosis not present

## 2014-09-07 DIAGNOSIS — E1165 Type 2 diabetes mellitus with hyperglycemia: Secondary | ICD-10-CM

## 2014-09-07 DIAGNOSIS — Z5181 Encounter for therapeutic drug level monitoring: Secondary | ICD-10-CM | POA: Diagnosis not present

## 2014-09-07 DIAGNOSIS — E1122 Type 2 diabetes mellitus with diabetic chronic kidney disease: Secondary | ICD-10-CM | POA: Diagnosis not present

## 2014-09-07 DIAGNOSIS — Z7901 Long term (current) use of anticoagulants: Secondary | ICD-10-CM | POA: Diagnosis not present

## 2014-09-07 LAB — POCT INR: INR: 2

## 2014-09-07 MED ORDER — GLUCOSE BLOOD VI STRP: ORAL_STRIP | Status: DC

## 2014-09-07 MED ORDER — INSULIN LISPRO 200 UNIT/ML ~~LOC~~ SOPN: 35.0000 [IU] | PEN_INJECTOR | Freq: Three times a day (TID) | SUBCUTANEOUS | Status: DC

## 2014-09-07 NOTE — Progress Notes (Signed)
  Subjective:    [name] is a 80 y.o.African American female who presents for follow-up of Type 2 diabetes mellitus.   A1C in early April was 9.6%, by the end of April 10.7% She should be taking her Toujeo and Humalog.  Recent lump found in breast - biopsy scheduled for Wednesday. Just saw Dr. Eulas Post last week.  Dr. Evette Georges office is managing anticoagulation for PE.  She self reports BG 200-300 No hypoglycemia.  Lowest BG  Concerned about cost of insulin. She has not missed any doses and denies lowering dose to conserve insulin. Trying to make healthy food choices.  Continues to skip meals.  Doesn't wake up until 12 noon. No routine exercise. Some peripheral edema. She has pain and "biting" sensation in her feet. Complains of joint pain Nocturia 4 times per night. Vision is blurry.   Review of Systems  A comprehensive review of systems was negative except for: Eyes: positive for visual disturbance Cardiovascular: positive for lower extremity edema Genitourinary: positive for nocturia Musculoskeletal: positive for arthralgias Neurological: positive for weakness and peripheral neuropathy Endocrine: positive for diabetic symptoms including blurry vision, increased fatigue, polydipsia, polyphagia, polyuria, pruritus and skin dryness    Objective:    BP 132/84 mmHg  Pulse 79  Temp(Src) 98.1 F (36.7 C) (Oral)  Wt 370 lb (167.831 kg)  General:  alert, cooperative, mild distress and morbidly obese  Oropharynx: normal findings: lips normal without lesions   Eyes:  negative findings: lids and lashes normal and conjunctivae and sclerae normal   Ears:  external ears normal        Lung: clear to auscultation bilaterally  Heart:  regular rate and rhythm     Extremities: edema bilateral lower extremities  Skin: dry     Neuro: mental status, speech normal, alert and oriented x3   Lab Review GLUCOSE (mg/dL)  Date Value  09/03/2014 258*  08/07/2014 169*  07/08/2014  188*   GLUCOSE, BLD (mg/dL)  Date Value  06/30/2014 138*  06/29/2014 163*  06/28/2014 186*   CO2 (mmol/L)  Date Value  09/03/2014 22  08/07/2014 23  07/08/2014 26   BUN (mg/dL)  Date Value  09/03/2014 20  08/07/2014 21  07/08/2014 22  06/30/2014 10  06/29/2014 15  06/28/2014 18   CREATININE (mg/dL)  Date Value  05/13/2012 1.4*   CREAT (mg/dL)  Date Value  10/21/2010 1.33*   CREATININE, SER (mg/dL)  Date Value  09/03/2014 1.19*  08/07/2014 1.23*  07/08/2014 1.35*       Assessment:    Diabetes Mellitus type II, under poor control.   BP at goal <140/90   Plan:    1.  Rx changes: increase Humalog to 35 units with each meal.  2.  Continue Toujeo 110 units daily. 3.  Counseled on importance of taking insulin as prescribed. 4.  Counseled on nutrition goals.  Needs to eat 3 balanced meals per day. 5.  Counseled on need for routine exercise.  Goal is 30-45 minutes 5 x week. 6.  Counseled on foot care.  Try capsacin cream as needed for foot pain. 7.  Provided new One Touch Verio BGM and instructed on use. 8.  Advised her to download patient assistance forms for Toujeo and Humalog.  Her grandson offered to do this for her. 9.  BP at goal on current RX.

## 2014-09-07 NOTE — Patient Instructions (Signed)
Increase Humalog to 35 units with each meal.

## 2014-09-09 ENCOUNTER — Ambulatory Visit: Payer: Medicare PPO | Admitting: Internal Medicine

## 2014-09-11 ENCOUNTER — Other Ambulatory Visit: Payer: Self-pay | Admitting: *Deleted

## 2014-09-11 DIAGNOSIS — G894 Chronic pain syndrome: Secondary | ICD-10-CM

## 2014-09-11 MED ORDER — OXYCODONE-ACETAMINOPHEN 10-325 MG PO TABS: ORAL_TABLET | ORAL | Status: DC

## 2014-09-11 NOTE — Telephone Encounter (Signed)
Patient requested and will pick up 

## 2014-09-15 ENCOUNTER — Other Ambulatory Visit: Payer: Self-pay | Admitting: Nurse Practitioner

## 2014-09-17 ENCOUNTER — Encounter (HOSPITAL_COMMUNITY): Payer: Self-pay

## 2014-09-17 ENCOUNTER — Encounter (HOSPITAL_COMMUNITY)
Admission: RE | Admit: 2014-09-17 | Discharge: 2014-09-17 | Disposition: A | Discharge status: Home / Self Care | Payer: Medicare PPO | Source: Ambulatory Visit | Attending: Surgery | Admitting: Surgery

## 2014-09-17 VITALS — BP 135/69 | HR 90 | Temp 98.3°F | Resp 18 | Ht 67.0 in | Wt 366.2 lb

## 2014-09-17 DIAGNOSIS — E785 Hyperlipidemia, unspecified: Secondary | ICD-10-CM | POA: Diagnosis not present

## 2014-09-17 DIAGNOSIS — Z01812 Encounter for preprocedural laboratory examination: Secondary | ICD-10-CM | POA: Diagnosis not present

## 2014-09-17 DIAGNOSIS — I2510 Atherosclerotic heart disease of native coronary artery without angina pectoris: Secondary | ICD-10-CM | POA: Diagnosis not present

## 2014-09-17 DIAGNOSIS — N6320 Unspecified lump in the left breast, unspecified quadrant: Secondary | ICD-10-CM

## 2014-09-17 DIAGNOSIS — E119 Type 2 diabetes mellitus without complications: Secondary | ICD-10-CM | POA: Diagnosis not present

## 2014-09-17 HISTORY — DX: Dizziness and giddiness: R42

## 2014-09-17 HISTORY — DX: Pain in unspecified joint: M25.50

## 2014-09-17 HISTORY — DX: Headache, unspecified: R51.9

## 2014-09-17 HISTORY — DX: Reserved for inherently not codable concepts without codable children: IMO0001

## 2014-09-17 HISTORY — DX: Acute myocardial infarction, unspecified: I21.9

## 2014-09-17 HISTORY — DX: Anesthesia of skin: R20.0

## 2014-09-17 HISTORY — DX: Effusion, unspecified joint: M25.40

## 2014-09-17 HISTORY — DX: Headache: R51

## 2014-09-17 HISTORY — DX: Personal history of other venous thrombosis and embolism: Z86.718

## 2014-09-17 LAB — BASIC METABOLIC PANEL
Anion gap: 11 (ref 5–15)
BUN: 20 mg/dL (ref 6–20)
CO2: 24 mmol/L (ref 22–32)
Calcium: 8.8 mg/dL — ABNORMAL LOW (ref 8.9–10.3)
Chloride: 104 mmol/L (ref 101–111)
Creatinine, Ser: 1.33 mg/dL — ABNORMAL HIGH (ref 0.44–1.00)
GFR calc Af Amer: 43 mL/min — ABNORMAL LOW (ref >60)
GFR calc non Af Amer: 37 mL/min — ABNORMAL LOW (ref >60)
Glucose, Bld: 216 mg/dL — ABNORMAL HIGH (ref 65–99)
Potassium: 4.7 mmol/L (ref 3.5–5.1)
Sodium: 139 mmol/L (ref 135–145)

## 2014-09-17 LAB — CBC
HCT: 32.6 % — ABNORMAL LOW (ref 36.0–46.0)
Hemoglobin: 9.9 g/dL — ABNORMAL LOW (ref 12.0–15.0)
MCH: 25.7 pg — ABNORMAL LOW (ref 26.0–34.0)
MCHC: 30.4 g/dL (ref 30.0–36.0)
MCV: 84.7 fL (ref 78.0–100.0)
Platelets: 341 10*3/uL (ref 150–400)
RBC: 3.85 MIL/uL — ABNORMAL LOW (ref 3.87–5.11)
RDW: 14.8 % (ref 11.5–15.5)
WBC: 7.9 10*3/uL (ref 4.0–10.5)

## 2014-09-17 NOTE — Progress Notes (Addendum)
Clearance note in epic from [place]  EKG in epic from 08-12-14  Echo reports in epic from 2012/2016  Stress test reports in epic from 2007/2014/2016  Heart cath in epic from 2016  Sleep study in epic from 2006  Medical Md is [place]  Denies CXR in past yr

## 2014-09-17 NOTE — Pre-Procedure Instructions (Signed)
[name]  09/17/2014   Your procedure is scheduled on:  Wed, May 18 @ 12:30 PM  Report to [facility]  at 10:30 AM.  Call this number if you have problems the morning of surgery: [phone]   Remember:   Do not eat food or drink liquids after midnight.   Take these medicines the morning of surgery with A SIP OF WATER: Albuterol<Bring Your Inhaler With You>,Clonazepam(Klonopin),Colchicine(Colcrys),Cymbalta(Duloxetine),Isosorbide(Imdur),Metoprolol(Toprol),Pain Pill(if needed),and Pantoprazole(Protonix)               Stop taking your Coumadin 4 days prior to surgery as instructed by Dr.Kelly. No Goody's,BC's,Aleve,Aspirin,Ibuprofen,Fish Oil,or any Herbal Medications.    Do not wear jewelry, make-up or nail polish.  Do not wear lotions, powders, or perfumes.   Do not shave 48 hours prior to surgery.   Do not bring valuables to the hospital.  [facility] is not responsible                  for any belongings or valuables.               Contacts, dentures or bridgework may not be worn into surgery.  Leave suitcase in the car. After surgery it may be brought to your room.  For patients admitted to the hospital, discharge time is determined by your                treatment team.               Patients discharged the day of surgery will not be allowed to drive  home.    Special Instructions:  [facility] - Preparing for Surgery  Before surgery, you can play an important role.  Because skin is not sterile, your skin needs to be as free of germs as possible.  You can reduce the number of germs on you skin by washing with CHG (chlorahexidine gluconate) soap before surgery.  CHG is an antiseptic cleaner which kills germs and bonds with the skin to continue killing germs even after washing.  Please DO NOT use if you have an allergy to CHG or antibacterial soaps.  If your skin becomes reddened/irritated stop using the CHG and inform your nurse when you arrive at Short Stay.  Do not  shave (including legs and underarms) for at least 48 hours prior to the first CHG shower.  You may shave your face.  Please follow these instructions carefully:   1.  Shower with CHG Soap the night before surgery and the                                morning of Surgery.  2.  If you choose to wash your hair, wash your hair first as usual with your       normal shampoo.  3.  After you shampoo, rinse your hair and body thoroughly to remove the                      Shampoo.  4.  Use CHG as you would any other liquid soap.  You can apply chg directly       to the skin and wash gently with scrungie or a clean washcloth.  5.  Apply the CHG Soap to your body ONLY FROM THE NECK DOWN.        Do not use on open wounds or open sores.  Avoid contact with your eyes,       ears, mouth and genitals (private parts).  Wash genitals (private parts)       with your normal soap.  6.  Wash thoroughly, paying special attention to the area where your surgery        will be performed.  7.  Thoroughly rinse your body with warm water from the neck down.  8.  DO NOT shower/wash with your normal soap after using and rinsing off       the CHG Soap.  9.  Pat yourself dry with a clean towel.            10.  Wear clean pajamas.            11.  Place clean sheets on your bed the night of your first shower and do not        sleep with pets.  Day of Surgery  Do not apply any lotions/deoderants the morning of surgery.  Please wear clean clothes to the hospital/surgery center.     Please read over the following fact sheets that you were given: Pain Booklet, Coughing and Deep Breathing and Surgical Site Infection Prevention

## 2014-09-17 NOTE — Progress Notes (Signed)
Average fasting blood sugar runs 120-130

## 2014-09-17 NOTE — Progress Notes (Signed)
error 

## 2014-09-18 NOTE — Progress Notes (Signed)
Anesthesia Chart Review:  Patient is a 80 year old female scheduled for radioactive seed localized left breast lumpectomy on 09/23/14 by Dr. [name].  History includes non-smoker, HLD, CAD s/p IMI '04 s/p BMS RCA s/p DES RCA '12, OSA, polymyalgia rheumatica, DVT/PE '13, anemia, CHF, depression, GERD, DM on insulin, vertigo, anxiety, osteoarthritis, gastric bypass '77, hysterectomy. BMI is consistent with morbid obesity (57.35). PCP is Dr. Gildardo Cranker with [facility].  Cardiologist Dr. Shelva Majestic cleared her for this procedure. Her primary cardiologist is Dr. Ena Dawley.  Meds include albuterol, Klonopin, Benadryl, Cymbalta, Lasix, Toujeo, Humalog, Imdur, Linzess, Toprol, Percocet, Protonix, Pravachol, warfarin. Dr. Claiborne Billings instructed her to hold warfarin four days prior to surgery.  08/12/14 EKG: NSR, non-specific T wave abnormality in high lateral leads.   06/26/14 Nuclear stress test: IMPRESSION: 1. Moderate inferior ischemia and mild anteroapical ischemia. 2. Inferior wall hypokinesis. 3. Left ventricular ejection fraction 51% 4. High-risk stress test findings*. Suggestive of multivessel CAD. This lead a a cardiac cath done on 06/29/14 that showed: IMPRESSION:  - Normal global LV function with mild mid inferior hypocontractility. - Coronary calcification with smooth 20% proximal narrowing followed by 30% stenosis before the first diagonal branch, 50-60% stenosis in the proximal diagonal 1 vessel with 30% mid LAD smooth narrowing; 20% smooth stenosis in the proximal left circumflex: Artery, and widely patent proximal and mid RCA stents without significant RCA stenoses. RECOMMENDATION: Medical therapy.  07/05/14 Echo: Left ventricle: The cavity size was normal. There was moderate concentric hypertrophy. Systolic function was normal. Theestimated ejection fraction was in the range of 60% to 65%. Wallmotion was normal; there were no regional wall motionabnormalities. There  was an increased relative contribution ofatrial contraction to ventricular filling. Doppler parameters are consistent with abnormal left ventricular relaxation (grade 1diastolic dysfunction).  06/24/14 CXR: Bibasilar atelectasis.  Preoperative labs noted. Cr 1.33, glucose 216. H/H 9.9/32.6, which appears overall stable since 06/2014 labs.  A1C on 09/03/14 was 10.7.  Fasting glucose typically 120-130 per patient. She will be getting an fasting CBG and PT/PTT on arrival.  I routed a message to Dr. Ninfa Linden regarding her CBC and recent A1C results.  Since her anemia appears chronic, and she is scheduled for a breast lumpectomy, I am not planning to repeat. Defer any additional recommendations to her surgeon and/or anesthesiologist.  If PT/PTT results acceptable and otherwise no acute changes then I anticipate that she can proceed as planned.   [facility]/Anesthesiology Phone [phone] 09/18/2014 3:06 PM

## 2014-09-22 ENCOUNTER — Ambulatory Visit
Admission: RE | Admit: 2014-09-22 | Discharge: 2014-09-22 | Disposition: A | Discharge status: Home / Self Care | Payer: Medicare PPO | Source: Ambulatory Visit | Attending: Surgery | Admitting: Surgery

## 2014-09-22 DIAGNOSIS — N6320 Unspecified lump in the left breast, unspecified quadrant: Secondary | ICD-10-CM

## 2014-09-22 MED ORDER — DEXTROSE 5 % IV SOLN: 3.0000 g | INTRAVENOUS | Status: DC

## 2014-09-23 ENCOUNTER — Ambulatory Visit
Admission: RE | Admit: 2014-09-23 | Discharge: 2014-09-23 | Disposition: A | Discharge status: Home / Self Care | Payer: Medicare PPO | Source: Ambulatory Visit | Attending: Surgery | Admitting: Surgery

## 2014-09-23 ENCOUNTER — Encounter (HOSPITAL_BASED_OUTPATIENT_CLINIC_OR_DEPARTMENT_OTHER): Admission: RE | Payer: Self-pay | Source: Ambulatory Visit

## 2014-09-23 ENCOUNTER — Ambulatory Visit (HOSPITAL_BASED_OUTPATIENT_CLINIC_OR_DEPARTMENT_OTHER): Admission: RE | Admit: 2014-09-23 | Payer: Medicare PPO | Source: Ambulatory Visit | Admitting: Surgery

## 2014-09-23 ENCOUNTER — Encounter: Payer: Self-pay | Admitting: Internal Medicine

## 2014-09-23 ENCOUNTER — Ambulatory Visit (INDEPENDENT_AMBULATORY_CARE_PROVIDER_SITE_OTHER): Payer: Medicare PPO | Admitting: Internal Medicine

## 2014-09-23 VITALS — BP 138/86 | HR 81 | Temp 97.6°F | Ht 67.0 in | Wt 366.0 lb

## 2014-09-23 DIAGNOSIS — G4700 Insomnia, unspecified: Secondary | ICD-10-CM | POA: Diagnosis not present

## 2014-09-23 DIAGNOSIS — Z7901 Long term (current) use of anticoagulants: Secondary | ICD-10-CM | POA: Diagnosis not present

## 2014-09-23 DIAGNOSIS — J449 Chronic obstructive pulmonary disease, unspecified: Secondary | ICD-10-CM

## 2014-09-23 DIAGNOSIS — E1129 Type 2 diabetes mellitus with other diabetic kidney complication: Secondary | ICD-10-CM

## 2014-09-23 DIAGNOSIS — J42 Unspecified chronic bronchitis: Secondary | ICD-10-CM | POA: Insufficient documentation

## 2014-09-23 DIAGNOSIS — E1165 Type 2 diabetes mellitus with hyperglycemia: Secondary | ICD-10-CM

## 2014-09-23 DIAGNOSIS — I5032 Chronic diastolic (congestive) heart failure: Secondary | ICD-10-CM

## 2014-09-23 DIAGNOSIS — IMO0002 Reserved for concepts with insufficient information to code with codable children: Secondary | ICD-10-CM

## 2014-09-23 DIAGNOSIS — J410 Simple chronic bronchitis: Secondary | ICD-10-CM

## 2014-09-23 DIAGNOSIS — I10 Essential (primary) hypertension: Secondary | ICD-10-CM | POA: Diagnosis not present

## 2014-09-23 DIAGNOSIS — N6320 Unspecified lump in the left breast, unspecified quadrant: Secondary | ICD-10-CM

## 2014-09-23 DIAGNOSIS — G44229 Chronic tension-type headache, not intractable: Secondary | ICD-10-CM

## 2014-09-23 SURGERY — BREAST LUMPECTOMY WITH RADIOACTIVE SEED LOCALIZATION
Anesthesia: General | Laterality: Left

## 2014-09-23 MED ORDER — TEMAZEPAM 30 MG PO CAPS
ORAL_CAPSULE | ORAL | Status: DC
Start: 2014-09-23 — End: 2014-10-27

## 2014-09-23 MED ORDER — BUDESONIDE-FORMOTEROL FUMARATE 160-4.5 MCG/ACT IN AERO: INHALATION_SPRAY | RESPIRATORY_TRACT | Status: DC

## 2014-09-23 NOTE — Progress Notes (Signed)
Patient ID: [name], female   DOB: 1934/12/18, 80 y.o.   MRN: [id number]    Facility  [name]    Place of Service:   OFFICE    Allergies  Allergen Reactions  . Sulfonamide Derivatives Swelling    Mouth swelling  . Tramadol Nausea And Vomiting    Chief Complaint  Patient presents with  . Medical Management of Chronic Issues    Complains of Blood Sugar running over 300. Had surgery scheduled for a breast lumpectomy and it had to be canceled due to the blood sugar being high.Blood Sugar this morning was 188    HPI:   DM (diabetes mellitus), type 2, uncontrolled, with renal complications: Patient was told that surgery had been canceled because of blood sugar being out of control. She currently is taking Toujeo twice daily and Humalog before meals. Patient admits to dietary noncompliance and she is morbidly obese. This is causing insulin resistance extremely large doses of insulin.  Essential hypertension: Controlled  Chronic obstructive pulmonary disease, unspecified COPD, unspecified chronic bronchitis type: Recent increase in cough and dyspnea.  OBESITY, MORBID: Persistently gaining weight over the last several years.  Chronic diastolic congestive heart failure:  controlled on current medications.  Long term current use of anticoagulant therapy: For history of pulmonary embolus and DVT.  Chronic tension-type headache, not intractable: Nondisabling. Occurs nearly daily. Seems to originate from deep inside her head.  Insomnia - sleeping poorly despite use of CPAP nightly.    Medications: Patient's Medications  New Prescriptions   No medications on file  Previous Medications   ACETAMINOPHEN (TYLENOL) 325 MG TABLET    Take 2 tablets (650 mg total) by mouth every 6 (six) hours as needed for mild pain (or Fever >/= 101).   ALBUTEROL (PROVENTIL HFA;VENTOLIN HFA) 108 (90 BASE) MCG/ACT INHALER    Inhale 2 puffs into the lungs every 6 (six) hours as needed for wheezing or  shortness of breath.   ALBUTEROL (PROVENTIL HFA;VENTOLIN HFA) 108 (90 BASE) MCG/ACT INHALER    Inhale 2 puffs into the lungs every 6 (six) hours as needed for wheezing or shortness of breath.   CALCIUM CARBONATE ANTACID (TUMS PO)    Take 3 tablets by mouth daily as needed (acid reflux).   CINNAMON 500 MG CAPSULE    Take 500 mg by mouth 2 (two) times daily.   CLONAZEPAM (KLONOPIN) 1 MG TABLET    TAKE 1 TABLET BY MOUTH EVERY MORNING AND 2 TABLETS AT BEDTIME FOR SLEEP   COLCHICINE (COLCRYS) 0.6 MG TABLET    Take 1 tablet by mouth every day for gout   DIPHENHYDRAMINE (BENADRYL) 25 MG TABLET    Take 50 mg by mouth at bedtime. For itching   DULOXETINE (CYMBALTA) 60 MG CAPSULE    Take 1 capsule by mouth every day for anxiety   FUROSEMIDE (LASIX) 20 MG TABLET    Take one tablet by mouth twice daily for edema   GLUCOSE BLOOD (ONETOUCH VERIO) TEST STRIP    Use as instructed   GUAIFENESIN-DEXTROMETHORPHAN (ROBITUSSIN DM) 100-10 MG/5ML SYRUP    Take 5 mLs by mouth every 4 (four) hours as needed for cough.   INSULIN GLARGINE (TOUJEO SOLOSTAR) 300 UNIT/ML SOPN    Inject 100 Units into the skin daily.   INSULIN LISPRO, HUMAN, (HUMALOG KWIKPEN) 200 UNIT/ML SOPN    Inject 35 Units into the skin 3 (three) times daily before meals.   INSULIN PEN NEEDLE 31G X 5 MM MISC  Use 3 needles daily with the administration of Insulin   ISOSORBIDE MONONITRATE (IMDUR) 60 MG 24 HR TABLET    Take 1 tablet (60 mg total) by mouth daily.   LINACLOTIDE (LINZESS) 145 MCG CAPS CAPSULE    Take 1 capsule (145 mcg total) by mouth daily. Sample provided   METOPROLOL SUCCINATE (TOPROL-XL) 25 MG 24 HR TABLET    Take 1 tablet by mouth once every day for blood pressure   NITROGLYCERIN (NITROSTAT) 0.4 MG SL TABLET    Take one tablet under the tongue every 5 minutes as needed for chest pain   OXYCODONE-ACETAMINOPHEN (PERCOCET) 10-325 MG PER TABLET    Take one tablet every 6 hours as needed for pain   PANTOPRAZOLE (PROTONIX) 40 MG TABLET    TAKE  ONE TABLET BY MOUTH ONCE DAILY   PRAVASTATIN (PRAVACHOL) 20 MG TABLET    Take 1 tablet by mouth once every day for cholesterol   WARFARIN (COUMADIN) 5 MG TABLET    Take 2 tablets (10 mg total) by mouth daily. Please take 7 mg oral daily on Tues, Thursday, Sat, then 5 mg on Mon, Wed Fri, Sun.  Modified Medications   No medications on file  Discontinued Medications   DULOXETINE (CYMBALTA) 60 MG CAPSULE    TAKE ONE CAPSULE BY MOUTH ONCE DAILY FOR ANXIETY   METOPROLOL SUCCINATE (TOPROL-XL) 25 MG 24 HR TABLET    TAKE ONE TABLET BY MOUTH ONCE DAILY FOR BLOOD PRESSURE     Review of Systems  Constitutional: Negative for fever, chills, diaphoresis, activity change, appetite change and fatigue.       Morbidly obese and dietary noncompliance.  HENT: Negative for ear pain and sore throat.   Eyes: Negative for visual disturbance.  Respiratory: Positive for cough, chest tightness, shortness of breath and wheezing.        History of right breast mass  Cardiovascular: Positive for leg swelling. Negative for chest pain and palpitations.  Gastrointestinal: Negative for nausea, vomiting, abdominal pain, diarrhea, constipation and blood in stool.  Endocrine:       Poorly controlled diabetic  Genitourinary: Negative for dysuria.  Musculoskeletal: Positive for back pain, joint swelling, arthralgias and gait problem.  Skin: Negative for rash.  Neurological: Positive for headaches (chronic, recurrent, tension-type). Negative for dizziness, tremors and numbness.  Psychiatric/Behavioral: Positive for dysphoric mood. Negative for sleep disturbance. The patient is not nervous/anxious.     Filed Vitals:   09/23/14 1403  BP: 138/86  Pulse: 81  Temp: 97.6 F (36.4 C)  TempSrc: Oral  Height: _0  (1.702 m)  Weight: 366 lb (166.017 kg)   Body mass index is 57.31 kg/(m^2).  Physical Exam  Constitutional: She is oriented to person, place, and time. She appears well-developed and well-nourished.  Sitting in  w/c. Morbid obesity.  HENT:  Mouth/Throat: Oropharynx is clear and moist. No oropharyngeal exudate.  Eyes: Pupils are equal, round, and reactive to light. No scleral icterus.  Neck: Neck supple. No tracheal deviation present. No thyromegaly present.  Cardiovascular: Normal rate, regular rhythm and intact distal pulses.  Exam reveals no gallop and no friction rub.   Murmur (1/6 SEM) heard. +1 pitting LE edema b/l. no calf TTP. No carotid bruit b/l  Pulmonary/Chest: Effort normal. No stridor. No respiratory distress. She has decreased breath sounds. She has wheezes. She has no rales.  Abdominal: Soft. Bowel sounds are normal. She exhibits no distension and no mass. There is no tenderness. There is no rebound and no guarding.  Musculoskeletal: She exhibits edema and tenderness.  B/l bunions with hammertoes. Right 1st toenail absent. Monofilament testing intact b/l  Lymphadenopathy:    She has no cervical adenopathy.  Neurological: She is alert and oriented to person, place, and time. She has normal reflexes.  Skin: Skin is warm and dry. No rash noted.  Psychiatric: Her behavior is normal. Judgment and thought content normal. She exhibits a depressed mood.     Labs reviewed: Hospital Outpatient Visit on 09/17/2014  Component Date Value Ref Range Status  . Sodium 09/17/2014 139  135 - 145 mmol/L Final  . Potassium 09/17/2014 4.7  3.5 - 5.1 mmol/L Final  . Chloride 09/17/2014 104  101 - 111 mmol/L Final  . CO2 09/17/2014 24  22 - 32 mmol/L Final  . Glucose, Bld 09/17/2014 216* 65 - 99 mg/dL Final  . BUN 09/17/2014 20  6 - 20 mg/dL Final  . Creatinine, Ser 09/17/2014 1.33* 0.44 - 1.00 mg/dL Final  . Calcium 09/17/2014 8.8* 8.9 - 10.3 mg/dL Final  . GFR calc non Af Amer 09/17/2014 37* >60 mL/min Final  . GFR calc Af Amer 09/17/2014 43* >60 mL/min Final   Comment: (NOTE) The eGFR has been calculated using the CKD EPI equation. This calculation has not been validated in all clinical  situations. eGFR's persistently <60 mL/min signify possible Chronic Kidney Disease.   . Anion gap 09/17/2014 11  5 - 15 Final  . WBC 09/17/2014 7.9  4.0 - 10.5 K/uL Final  . RBC 09/17/2014 3.85* 3.87 - 5.11 MIL/uL Final  . Hemoglobin 09/17/2014 9.9* 12.0 - 15.0 g/dL Final  . HCT 09/17/2014 32.6* 36.0 - 46.0 % Final  . MCV 09/17/2014 84.7  78.0 - 100.0 fL Final  . MCH 09/17/2014 25.7* 26.0 - 34.0 pg Final  . MCHC 09/17/2014 30.4  30.0 - 36.0 g/dL Final  . RDW 09/17/2014 14.8  11.5 - 15.5 % Final  . Platelets 09/17/2014 341  150 - 400 K/uL Final  Anti-coag visit on 09/07/2014  Component Date Value Ref Range Status  . INR 09/07/2014 2.0   Final  Lab on 09/03/2014  Component Date Value Ref Range Status  . Hgb A1c MFr Bld 09/03/2014 10.7* 4.8 - 5.6 % Final   Comment:          Pre-diabetes: 5.7 - 6.4          Diabetes: >6.4          Glycemic control for adults with diabetes: <7.0   . Est. average glucose Bld gHb Est-m* 09/03/2014 260   Final  . Glucose 09/03/2014 258* 65 - 99 mg/dL Final  . BUN 09/03/2014 20  8 - 27 mg/dL Final  . Creatinine, Ser 09/03/2014 1.19* 0.57 - 1.00 mg/dL Final  . GFR calc non Af Amer 09/03/2014 44* >59 mL/min/1.73 Final  . GFR calc Af Amer 09/03/2014 51* >59 mL/min/1.73 Final  . BUN/Creatinine Ratio 09/03/2014 17  11 - 26 Final  . Sodium 09/03/2014 139  134 - 144 mmol/L Final  . Potassium 09/03/2014 4.6  3.5 - 5.2 mmol/L Final  . Chloride 09/03/2014 99  97 - 108 mmol/L Final  . CO2 09/03/2014 22  18 - 29 mmol/L Final  . Calcium 09/03/2014 8.7  8.7 - 10.3 mg/dL Final  . Total Protein 09/03/2014 7.2  6.0 - 8.5 g/dL Final  . Albumin 09/03/2014 3.5  3.5 - 4.8 g/dL Final  . Globulin, Total 09/03/2014 3.7  1.5 - 4.5 g/dL Final  . Albumin/Globulin  Ratio 09/03/2014 0.9* 1.1 - 2.5 Final  . Bilirubin Total 09/03/2014 0.2  0.0 - 1.2 mg/dL Final  . Alkaline Phosphatase 09/03/2014 96  39 - 117 IU/L Final  . AST 09/03/2014 17  0 - 40 IU/L Final  . ALT 09/03/2014 16   0 - 32 IU/L Final  Anti-coag visit on 08/10/2014  Component Date Value Ref Range Status  . INR 08/10/2014 2.0   Final  Appointment on 08/07/2014  Component Date Value Ref Range Status  . Hgb A1c MFr Bld 08/07/2014 9.6* 4.8 - 5.6 % Final   Comment:          Pre-diabetes: 5.7 - 6.4          Diabetes: >6.4          Glycemic control for adults with diabetes: <7.0   . Est. average glucose Bld gHb Est-m* 08/07/2014 229   Final  . Glucose 08/07/2014 169* 65 - 99 mg/dL Final  . BUN 08/07/2014 21  8 - 27 mg/dL Final  . Creatinine, Ser 08/07/2014 1.23* 0.57 - 1.00 mg/dL Final  . GFR calc non Af Amer 08/07/2014 42* >59 mL/min/1.73 Final  . GFR calc Af Amer 08/07/2014 49* >59 mL/min/1.73 Final  . BUN/Creatinine Ratio 08/07/2014 17  11 - 26 Final  . Sodium 08/07/2014 139  134 - 144 mmol/L Final  . Potassium 08/07/2014 4.6  3.5 - 5.2 mmol/L Final  . Chloride 08/07/2014 99  97 - 108 mmol/L Final  . CO2 08/07/2014 23  18 - 29 mmol/L Final  . Calcium 08/07/2014 8.5* 8.7 - 10.3 mg/dL Final  . Total Protein 08/07/2014 6.8  6.0 - 8.5 g/dL Final  . Albumin 08/07/2014 3.5  3.5 - 4.8 g/dL Final  . Globulin, Total 08/07/2014 3.3  1.5 - 4.5 g/dL Final  . Albumin/Globulin Ratio 08/07/2014 1.1  1.1 - 2.5 Final  . Bilirubin Total 08/07/2014 <0.2  0.0 - 1.2 mg/dL Final  . Alkaline Phosphatase 08/07/2014 84  39 - 117 IU/L Final  . AST 08/07/2014 19  0 - 40 IU/L Final  . ALT 08/07/2014 17  0 - 32 IU/L Final  Anti-coag visit on 07/13/2014  Component Date Value Ref Range Status  . INR 07/13/2014 2.1   Final  Office Visit on 07/08/2014  Component Date Value Ref Range Status  . WBC 07/08/2014 8.2  3.4 - 10.8 x10E3/uL Final  . RBC 07/08/2014 3.93  3.77 - 5.28 x10E6/uL Final  . Hemoglobin 07/08/2014 10.6* 11.1 - 15.9 g/dL Final  . HCT 07/08/2014 33.4* 34.0 - 46.6 % Final  . MCV 07/08/2014 85  79 - 97 fL Final  . MCH 07/08/2014 27.0  26.6 - 33.0 pg Final  . MCHC 07/08/2014 31.7  31.5 - 35.7 g/dL Final  . RDW  07/08/2014 15.6* 12.3 - 15.4 % Final  . Platelets 07/08/2014 312  150 - 379 x10E3/uL Final  . Neutrophils Relative % 07/08/2014 62   Final  . Lymphs 07/08/2014 28   Final  . Monocytes 07/08/2014 8   Final  . Eos 07/08/2014 2   Final  . Basos 07/08/2014 0   Final  . Neutrophils Absolute 07/08/2014 5.0  1.4 - 7.0 x10E3/uL Final  . Lymphocytes Absolute 07/08/2014 2.3  0.7 - 3.1 x10E3/uL Final  . Monocytes Absolute 07/08/2014 0.7  0.1 - 0.9 x10E3/uL Final  . Eosinophils Absolute 07/08/2014 0.2  0.0 - 0.4 x10E3/uL Final  . Basophils Absolute 07/08/2014 0.0  0.0 - 0.2 x10E3/uL Final  .  Immature Granulocytes 07/08/2014 0   Final  . Immature Grans (Abs) 07/08/2014 0.0  0.0 - 0.1 x10E3/uL Final  . Glucose 07/08/2014 188* 65 - 99 mg/dL Final  . BUN 07/08/2014 22  8 - 27 mg/dL Final  . Creatinine, Ser 07/08/2014 1.35* 0.57 - 1.00 mg/dL Final  . GFR calc non Af Amer 07/08/2014 38* >59 mL/min/1.73 Final  . GFR calc Af Amer 07/08/2014 44* >59 mL/min/1.73 Final  . BUN/Creatinine Ratio 07/08/2014 16  11 - 26 Final  . Sodium 07/08/2014 139  134 - 144 mmol/L Final  . Potassium 07/08/2014 4.9  3.5 - 5.2 mmol/L Final  . Chloride 07/08/2014 98  97 - 108 mmol/L Final  . CO2 07/08/2014 26  18 - 29 mmol/L Final  . Calcium 07/08/2014 8.8  8.7 - 10.3 mg/dL Final  . Prolactin 07/08/2014 14.4  4.8 - 23.3 ng/mL Final  Anti-coag visit on 07/03/2014  Component Date Value Ref Range Status  . INR 07/03/2014 1.4   Final  Admission on 06/24/2014, Discharged on 06/30/2014  No results displayed because visit has over 200 results.       Assessment/Plan  1. DM (diabetes mellitus), type 2, uncontrolled, with renal complications Extended episode of counseling regarding the use of Toujeo. I recommended leaving the mealtime Humalog as currently given. Patient says she would be comfortable increasing the use of the Toujeo. I recommended attempting to get her blood sugars less than 150 mg percent. She will increase Toujeo  to 65 units twice daily. She will obtain morning fasting glucose 3 days ago, divided by 3, and if greater than 150 will add another 5 units Toujeo twice daily. She will repeat this process every 3 days until average blood sugar is less than 150.  2. Essential hypertension Controlled  3. Chronic obstructive pulmonary disease, unspecified COPD, unspecified chronic bronchitis type Increased dyspnea and wheezing recently. Added Symbicort inhaler.  4. Simple chronic bronchitis - budesonide-formoterol (SYMBICORT) 160-4.5 MCG/ACT inhaler; In 2 puffs twice daily to help wheezing  Dispense: 1 Inhaler; Refill: 3  5. OBESITY, MORBID Counseled on weight loss  6. Chronic diastolic congestive heart failure Adequately controlled on present medication  7. Long term current use of anticoagulant therapy Resume until blood sugars were brought under control. Anticipate this will take about 2 weeks. At that time surgery could be rescheduled and plans to stop anticoagulation initiated.  8. Chronic tension-type headache, not intractable Continue Tylenol and hydrocodone as needed  9. Insomnia - temazepam (RESTORIL) 30 MG capsule; One at bed for insomnia  Dispense: 30 capsule; Refill: 2

## 2014-09-23 NOTE — Patient Instructions (Signed)
Resume warfarin. °

## 2014-09-24 ENCOUNTER — Emergency Department (HOSPITAL_COMMUNITY): Payer: Medicare PPO

## 2014-09-24 ENCOUNTER — Other Ambulatory Visit: Payer: Self-pay

## 2014-09-24 ENCOUNTER — Encounter (HOSPITAL_COMMUNITY): Payer: Self-pay | Admitting: Emergency Medicine

## 2014-09-24 ENCOUNTER — Telehealth: Payer: Self-pay | Admitting: *Deleted

## 2014-09-24 ENCOUNTER — Observation Stay (HOSPITAL_COMMUNITY)
Admission: EM | Admit: 2014-09-24 | Discharge: 2014-09-26 | Disposition: A | Discharge status: Home Health | Payer: Medicare PPO | Attending: Internal Medicine | Admitting: Internal Medicine

## 2014-09-24 DIAGNOSIS — M353 Polymyalgia rheumatica: Secondary | ICD-10-CM | POA: Diagnosis not present

## 2014-09-24 DIAGNOSIS — E785 Hyperlipidemia, unspecified: Secondary | ICD-10-CM | POA: Diagnosis present

## 2014-09-24 DIAGNOSIS — R42 Dizziness and giddiness: Principal | ICD-10-CM

## 2014-09-24 DIAGNOSIS — I5032 Chronic diastolic (congestive) heart failure: Secondary | ICD-10-CM

## 2014-09-24 DIAGNOSIS — K219 Gastro-esophageal reflux disease without esophagitis: Secondary | ICD-10-CM | POA: Insufficient documentation

## 2014-09-24 DIAGNOSIS — E1165 Type 2 diabetes mellitus with hyperglycemia: Secondary | ICD-10-CM | POA: Diagnosis not present

## 2014-09-24 DIAGNOSIS — N183 Chronic kidney disease, stage 3 (moderate): Secondary | ICD-10-CM | POA: Diagnosis not present

## 2014-09-24 DIAGNOSIS — G4733 Obstructive sleep apnea (adult) (pediatric): Secondary | ICD-10-CM | POA: Insufficient documentation

## 2014-09-24 DIAGNOSIS — I4891 Unspecified atrial fibrillation: Secondary | ICD-10-CM | POA: Diagnosis not present

## 2014-09-24 DIAGNOSIS — R51 Headache: Secondary | ICD-10-CM | POA: Diagnosis not present

## 2014-09-24 DIAGNOSIS — F419 Anxiety disorder, unspecified: Secondary | ICD-10-CM | POA: Diagnosis not present

## 2014-09-24 DIAGNOSIS — IMO0002 Reserved for concepts with insufficient information to code with codable children: Secondary | ICD-10-CM

## 2014-09-24 DIAGNOSIS — I82509 Chronic embolism and thrombosis of unspecified deep veins of unspecified lower extremity: Secondary | ICD-10-CM | POA: Diagnosis not present

## 2014-09-24 DIAGNOSIS — Z9119 Patient's noncompliance with other medical treatment and regimen: Secondary | ICD-10-CM | POA: Insufficient documentation

## 2014-09-24 DIAGNOSIS — H8110 Benign paroxysmal vertigo, unspecified ear: Secondary | ICD-10-CM

## 2014-09-24 DIAGNOSIS — I2782 Chronic pulmonary embolism: Secondary | ICD-10-CM | POA: Insufficient documentation

## 2014-09-24 DIAGNOSIS — Z7901 Long term (current) use of anticoagulants: Secondary | ICD-10-CM | POA: Insufficient documentation

## 2014-09-24 DIAGNOSIS — F329 Major depressive disorder, single episode, unspecified: Secondary | ICD-10-CM | POA: Diagnosis not present

## 2014-09-24 DIAGNOSIS — I2510 Atherosclerotic heart disease of native coronary artery without angina pectoris: Secondary | ICD-10-CM | POA: Insufficient documentation

## 2014-09-24 DIAGNOSIS — I2699 Other pulmonary embolism without acute cor pulmonale: Secondary | ICD-10-CM | POA: Diagnosis present

## 2014-09-24 DIAGNOSIS — I10 Essential (primary) hypertension: Secondary | ICD-10-CM

## 2014-09-24 DIAGNOSIS — Z955 Presence of coronary angioplasty implant and graft: Secondary | ICD-10-CM | POA: Diagnosis not present

## 2014-09-24 DIAGNOSIS — Z794 Long term (current) use of insulin: Secondary | ICD-10-CM | POA: Diagnosis not present

## 2014-09-24 DIAGNOSIS — E1122 Type 2 diabetes mellitus with diabetic chronic kidney disease: Secondary | ICD-10-CM

## 2014-09-24 DIAGNOSIS — I129 Hypertensive chronic kidney disease with stage 1 through stage 4 chronic kidney disease, or unspecified chronic kidney disease: Secondary | ICD-10-CM | POA: Insufficient documentation

## 2014-09-24 DIAGNOSIS — E66813 Obesity, class 3: Secondary | ICD-10-CM | POA: Diagnosis present

## 2014-09-24 LAB — COMPREHENSIVE METABOLIC PANEL
ALT: 21 U/L (ref 14–54)
AST: 22 U/L (ref 15–41)
Albumin: 2.9 g/dL — ABNORMAL LOW (ref 3.5–5.0)
Alkaline Phosphatase: 84 U/L (ref 38–126)
Anion gap: 10 (ref 5–15)
BUN: 20 mg/dL (ref 6–20)
CO2: 25 mmol/L (ref 22–32)
Calcium: 8.9 mg/dL (ref 8.9–10.3)
Chloride: 101 mmol/L (ref 101–111)
Creatinine, Ser: 1.28 mg/dL — ABNORMAL HIGH (ref 0.44–1.00)
GFR calc Af Amer: 46 mL/min — ABNORMAL LOW (ref >60)
GFR calc non Af Amer: 39 mL/min — ABNORMAL LOW (ref >60)
Glucose, Bld: 325 mg/dL — ABNORMAL HIGH (ref 65–99)
Potassium: 5.3 mmol/L — ABNORMAL HIGH (ref 3.5–5.1)
Sodium: 136 mmol/L (ref 135–145)
Total Bilirubin: 0.4 mg/dL (ref 0.3–1.2)
Total Protein: 8.4 g/dL — ABNORMAL HIGH (ref 6.5–8.1)

## 2014-09-24 LAB — CBC WITH DIFFERENTIAL/PLATELET
Basophils Absolute: 0 10*3/uL (ref 0.0–0.1)
Basophils Relative: 0 % (ref 0–1)
Eosinophils Absolute: 0.4 10*3/uL (ref 0.0–0.7)
Eosinophils Relative: 4 % (ref 0–5)
HCT: 34.9 % — ABNORMAL LOW (ref 36.0–46.0)
Hemoglobin: 10.7 g/dL — ABNORMAL LOW (ref 12.0–15.0)
Lymphocytes Relative: 24 % (ref 12–46)
Lymphs Abs: 2.1 10*3/uL (ref 0.7–4.0)
MCH: 26.3 pg (ref 26.0–34.0)
MCHC: 30.7 g/dL (ref 30.0–36.0)
MCV: 85.7 fL (ref 78.0–100.0)
Monocytes Absolute: 0.5 10*3/uL (ref 0.1–1.0)
Monocytes Relative: 6 % (ref 3–12)
Neutro Abs: 5.9 10*3/uL (ref 1.7–7.7)
Neutrophils Relative %: 66 % (ref 43–77)
Platelets: 319 10*3/uL (ref 150–400)
RBC: 4.07 MIL/uL (ref 3.87–5.11)
RDW: 15.1 % (ref 11.5–15.5)
WBC: 8.9 10*3/uL (ref 4.0–10.5)

## 2014-09-24 LAB — URINALYSIS, ROUTINE W REFLEX MICROSCOPIC
Bilirubin Urine: NEGATIVE
Glucose, UA: NEGATIVE mg/dL
Ketones, ur: NEGATIVE mg/dL
Nitrite: NEGATIVE
Protein, ur: NEGATIVE mg/dL
Specific Gravity, Urine: 1.016 (ref 1.005–1.030)
Urobilinogen, UA: 0.2 mg/dL (ref 0.0–1.0)
pH: 5 (ref 5.0–8.0)

## 2014-09-24 LAB — URINE MICROSCOPIC-ADD ON

## 2014-09-24 LAB — I-STAT TROPONIN, ED: Troponin i, poc: 0.01 ng/mL (ref 0.00–0.08)

## 2014-09-24 MED ORDER — INSULIN GLARGINE 300 UNIT/ML ~~LOC~~ SOPN: 110.0000 [IU] | PEN_INJECTOR | Freq: Every day | SUBCUTANEOUS | Status: DC

## 2014-09-24 MED ORDER — PANTOPRAZOLE SODIUM 40 MG PO TBEC
40.0000 mg | DELAYED_RELEASE_TABLET | Freq: Every day | ORAL | Status: DC
  Administered 2014-09-25 – 2014-09-26 (×2): 40 mg via ORAL
  Filled 2014-09-24 (×2): qty 1

## 2014-09-24 MED ORDER — DIPHENHYDRAMINE HCL 25 MG PO TABS: 50.0000 mg | ORAL_TABLET | Freq: Every day | ORAL | Status: DC

## 2014-09-24 MED ORDER — MECLIZINE HCL 25 MG PO TABS
25.0000 mg | ORAL_TABLET | Freq: Three times a day (TID) | ORAL | Status: DC | PRN
  Filled 2014-09-24: qty 1

## 2014-09-24 MED ORDER — METOPROLOL SUCCINATE ER 25 MG PO TB24
25.0000 mg | ORAL_TABLET | Freq: Every day | ORAL | Status: DC
  Administered 2014-09-25 – 2014-09-26 (×2): 25 mg via ORAL
  Filled 2014-09-24 (×2): qty 1

## 2014-09-24 MED ORDER — CLONAZEPAM 1 MG PO TABS
1.0000 mg | ORAL_TABLET | Freq: Every day | ORAL | Status: DC
  Administered 2014-09-25 – 2014-09-26 (×2): 1 mg via ORAL
  Filled 2014-09-24 (×2): qty 1

## 2014-09-24 MED ORDER — LORAZEPAM 2 MG/ML IJ SOLN
1.0000 mg | Freq: Once | INTRAMUSCULAR | Status: AC
  Administered 2014-09-24: 1 mg via INTRAVENOUS
  Filled 2014-09-24: qty 1

## 2014-09-24 MED ORDER — SODIUM CHLORIDE 0.9 % IJ SOLN
3.0000 mL | Freq: Two times a day (BID) | INTRAMUSCULAR | Status: DC
  Administered 2014-09-24 – 2014-09-26 (×3): 3 mL via INTRAVENOUS

## 2014-09-24 MED ORDER — ONDANSETRON HCL 4 MG/2ML IJ SOLN: 4.0000 mg | Freq: Four times a day (QID) | INTRAMUSCULAR | Status: DC | PRN

## 2014-09-24 MED ORDER — ISOSORBIDE MONONITRATE ER 60 MG PO TB24
60.0000 mg | ORAL_TABLET | Freq: Every day | ORAL | Status: DC
  Administered 2014-09-25 – 2014-09-26 (×2): 60 mg via ORAL
  Filled 2014-09-24 (×2): qty 1

## 2014-09-24 MED ORDER — ONDANSETRON HCL 4 MG PO TABS: 4.0000 mg | ORAL_TABLET | Freq: Four times a day (QID) | ORAL | Status: DC | PRN

## 2014-09-24 MED ORDER — PRAVASTATIN SODIUM 20 MG PO TABS
20.0000 mg | ORAL_TABLET | Freq: Every day | ORAL | Status: DC
  Administered 2014-09-25 – 2014-09-26 (×2): 20 mg via ORAL
  Filled 2014-09-24 (×2): qty 1

## 2014-09-24 MED ORDER — INSULIN ASPART 100 UNIT/ML ~~LOC~~ SOLN
0.0000 [IU] | Freq: Three times a day (TID) | SUBCUTANEOUS | Status: DC
Start: 2014-09-25 — End: 2014-09-26
  Administered 2014-09-25 (×2): 3 [IU] via SUBCUTANEOUS
  Administered 2014-09-26: 1 [IU] via SUBCUTANEOUS

## 2014-09-24 MED ORDER — MECLIZINE HCL 25 MG PO TABS
25.0000 mg | ORAL_TABLET | Freq: Once | ORAL | Status: DC
  Filled 2014-09-24: qty 1

## 2014-09-24 MED ORDER — BUDESONIDE-FORMOTEROL FUMARATE 160-4.5 MCG/ACT IN AERO
2.0000 | INHALATION_SPRAY | Freq: Two times a day (BID) | RESPIRATORY_TRACT | Status: DC
  Administered 2014-09-25 – 2014-09-26 (×3): 2 via RESPIRATORY_TRACT
  Filled 2014-09-24: qty 6

## 2014-09-24 MED ORDER — NITROGLYCERIN 0.4 MG SL SUBL: 0.4000 mg | SUBLINGUAL_TABLET | SUBLINGUAL | Status: DC | PRN

## 2014-09-24 MED ORDER — INSULIN LISPRO 200 UNIT/ML ~~LOC~~ SOPN: 35.0000 [IU] | PEN_INJECTOR | SUBCUTANEOUS | Status: DC

## 2014-09-24 MED ORDER — ACETAMINOPHEN 650 MG RE SUPP: 650.0000 mg | Freq: Four times a day (QID) | RECTAL | Status: DC | PRN

## 2014-09-24 MED ORDER — LINACLOTIDE 145 MCG PO CAPS
145.0000 ug | ORAL_CAPSULE | Freq: Every day | ORAL | Status: DC
  Administered 2014-09-25 – 2014-09-26 (×2): 145 ug via ORAL
  Filled 2014-09-24 (×2): qty 1

## 2014-09-24 MED ORDER — OXYCODONE-ACETAMINOPHEN 10-325 MG PO TABS: 1.0000 | ORAL_TABLET | Freq: Four times a day (QID) | ORAL | Status: DC | PRN

## 2014-09-24 MED ORDER — CLONAZEPAM 1 MG PO TABS
2.0000 mg | ORAL_TABLET | Freq: Every day | ORAL | Status: DC
  Administered 2014-09-25 (×2): 2 mg via ORAL
  Filled 2014-09-24 (×2): qty 2

## 2014-09-24 MED ORDER — DULOXETINE HCL 60 MG PO CPEP
60.0000 mg | ORAL_CAPSULE | Freq: Every day | ORAL | Status: DC
  Administered 2014-09-25 – 2014-09-26 (×2): 60 mg via ORAL
  Filled 2014-09-24 (×2): qty 1

## 2014-09-24 MED ORDER — ALBUTEROL SULFATE (2.5 MG/3ML) 0.083% IN NEBU: 2.5000 mg | INHALATION_SOLUTION | Freq: Four times a day (QID) | RESPIRATORY_TRACT | Status: DC | PRN

## 2014-09-24 MED ORDER — TEMAZEPAM 15 MG PO CAPS
30.0000 mg | ORAL_CAPSULE | Freq: Every day | ORAL | Status: DC
  Administered 2014-09-25 (×2): 30 mg via ORAL
  Filled 2014-09-24 (×2): qty 2

## 2014-09-24 MED ORDER — ACETAMINOPHEN 325 MG PO TABS: 650.0000 mg | ORAL_TABLET | Freq: Four times a day (QID) | ORAL | Status: DC | PRN

## 2014-09-24 MED ORDER — GUAIFENESIN-DM 100-10 MG/5ML PO SYRP: 5.0000 mL | ORAL_SOLUTION | ORAL | Status: DC | PRN

## 2014-09-24 MED ORDER — COLCHICINE 0.6 MG PO TABS
0.6000 mg | ORAL_TABLET | Freq: Every day | ORAL | Status: DC
  Administered 2014-09-25 – 2014-09-26 (×2): 0.6 mg via ORAL
  Filled 2014-09-24 (×2): qty 1

## 2014-09-24 MED ORDER — FUROSEMIDE 20 MG PO TABS
20.0000 mg | ORAL_TABLET | Freq: Two times a day (BID) | ORAL | Status: DC
  Administered 2014-09-25 – 2014-09-26 (×3): 20 mg via ORAL
  Filled 2014-09-24 (×5): qty 1

## 2014-09-24 MED ORDER — DIAZEPAM 2 MG PO TABS: 2.0000 mg | ORAL_TABLET | Freq: Four times a day (QID) | ORAL | Status: DC | PRN

## 2014-09-24 MED ORDER — DIAZEPAM 5 MG PO TABS
5.0000 mg | ORAL_TABLET | Freq: Once | ORAL | Status: AC
  Administered 2014-09-24: 5 mg via ORAL
  Filled 2014-09-24: qty 1

## 2014-09-24 NOTE — ED Notes (Signed)
MRI called RN, pt unable to get MRI at this time d/t anxiety.  MD aware and informed pt to come back to room.  MRI aware.

## 2014-09-24 NOTE — ED Notes (Signed)
Pt not aware of type of stent placed in past, MRI aware. Will update MD.

## 2014-09-24 NOTE — Telephone Encounter (Signed)
Patient daughter called and stated that patient is sick and wants answers. Stated that she stayed up all night last night crying and complaining with her head hurting. Blood sugars still running high and patient is complaining of not feeling or doing well at all and having severe headaches. And daughter stated she wants more test ran and wants to know what is going on and wants her admitted to hospital. Informed her to go ahead and take to hospital and she agreed.

## 2014-09-24 NOTE — ED Notes (Signed)
Patient transported to MRI 

## 2014-09-24 NOTE — ED Provider Notes (Signed)
CSN: [id number]     Arrival date & time 09/24/14  1323 History   First MD Initiated Contact with Patient 09/24/14 1522     Chief Complaint  Patient presents with  . Dizziness     (Consider location/radiation/quality/duration/timing/severity/associated sxs/prior Treatment) Patient is a 80 y.o. female presenting with dizziness. The history is provided by the patient. The history is limited by the condition of the patient.  Dizziness Quality:  Head spinning and room spinning Severity:  Severe Onset quality:  Gradual Duration:  2 days Timing:  Constant Progression:  Unchanged Chronicity:  New Context: eye movement and head movement   Relieved by:  Nothing Worsened by:  Movement Ineffective treatments:  None tried Associated symptoms: chest pain, headaches and nausea   Associated symptoms: no diarrhea, no palpitations, no shortness of breath, no syncope, no vision changes, no vomiting and no weakness   Chest pain:    Quality: pressure     Severity:  Mild   Onset quality:  Gradual   Duration:  1 day   Timing:  Constant   Progression:  Unchanged   Chronicity:  New Headaches:    Severity:  Mild   Onset quality:  Gradual   Duration:  2 days   Timing:  Constant   Progression:  Unchanged   Chronicity:  New Risk factors: heart disease     Past Medical History  Diagnosis Date  . Hyperlipidemia     takes Pravastatin daily  . Coronary artery disease     a. s/p IMI 2004 tx with BMS to RCA;  b. s/p Promus DES to RCA 2/12 (cath: LM 20-30%, pLAD 20-30%, mLAD 50%, RI 30%, pRCA 60%, mRCA 90% - tx with PCI);  c. myoview 1/07: EF 49%, inf scar, no isch  . Urinary incontinence     takes Linzess daily  . Insomnia   . Osteoarthritis   . Obstructive sleep apnea   . Polymyalgia rheumatica   . Arthritis   . Pulmonary emboli 9/13    felt to need lifelong anticoagulation  . Allergy     takes Mucinex daily as needed  . Gout, unspecified     takes Colchicine daily  . Dysuria   . Pain,  chronic   . Osteoarthrosis, unspecified whether generalized or localized, lower leg   . Anemia, unspecified   . Coronary atherosclerosis of native coronary artery   . CHF (congestive heart failure)     takes Furosemide daily  . Depression     takes Cymbalta daily  . Anxiety     takes Clonazepam daily as needed  . Hypertension     takes Imdur and Metoprolol daily  . GERD (gastroesophageal reflux disease)     takes Protonix daily  . Diabetes mellitus     insulin daily  . Type II or unspecified type diabetes mellitus without mention of complication, uncontrolled   . Myocardial infarction 2004  . History of blood clots about 79yrs ago    in legs-takes Coumadin   . Shortness of breath dyspnea     with exertion and has Albuterol inhaler prn  . Headache     occasionally  . Vertigo     hx of;was taking Meclizine if needed  . Numbness   . Joint pain   . Joint swelling   . Osteoarthritis    Past Surgical History  Procedure Laterality Date  . Appendectomy    . Cardiac catheterization    . Gastric bypass  1977  reversed in 1979, [facility]  . Blood clots/legs and lungs  2013  . Mi with stent placement  2004  . Left heart catheterization with coronary angiogram N/A 06/29/2014    Procedure: LEFT HEART CATHETERIZATION WITH CORONARY ANGIOGRAM;  Surgeon: Troy Sine, MD;  Location: [facility] CATH LAB;  Service: Cardiovascular;  Laterality: N/A;  . Coronary angioplasty  2  . Eye surgery Bilateral     cataract   . Colonoscopy    . Abdominal hysterectomy      partial   Family History  Problem Relation Age of Onset  . Breast cancer Mother   . Heart disease Mother   . Throat cancer Father   . Hypertension Father   . Arthritis Father   . Diabetes Father   . Arthritis Sister   . Obesity Sister   . Diabetes Sister    History  Substance Use Topics  . Smoking status: Never Smoker   . Smokeless tobacco: Not on file  . Alcohol Use: No   OB History    No data available      Review of Systems  Constitutional: Negative for fever, chills, diaphoresis and fatigue.  Eyes: Negative for photophobia and visual disturbance.  Respiratory: Negative for cough, chest tightness and shortness of breath.   Cardiovascular: Positive for chest pain. Negative for palpitations, leg swelling and syncope.  Gastrointestinal: Positive for nausea. Negative for vomiting, abdominal pain, diarrhea and abdominal distention.  Musculoskeletal: Positive for gait problem. Negative for back pain, neck pain and neck stiffness.  Neurological: Positive for dizziness and headaches. Negative for syncope, facial asymmetry, speech difficulty, weakness, light-headedness and numbness.  All other systems reviewed and are negative.     Allergies  Sulfonamide derivatives and Tramadol  Home Medications   Prior to Admission medications   Medication Sig Start Date End Date Taking? Authorizing Provider  albuterol (PROVENTIL HFA;VENTOLIN HFA) 108 (90 BASE) MCG/ACT inhaler Inhale 2 puffs into the lungs every 6 (six) hours as needed for wheezing or shortness of breath. 05/05/14  Yes Mahima Bubba Camp, MD  budesonide-formoterol (SYMBICORT) 160-4.5 MCG/ACT inhaler In 2 puffs twice daily to help wheezing 09/23/14  Yes Estill Dooms, MD  Calcium Carbonate Antacid (TUMS PO) Take 3 tablets by mouth daily as needed (acid reflux).   Yes Historical Provider, MD  Cinnamon 500 MG capsule Take 500 mg by mouth 2 (two) times daily.   Yes Historical Provider, MD  colchicine (COLCRYS) 0.6 MG tablet Take 1 tablet by mouth every day for gout Patient taking differently: Take 0.6 mg by mouth daily. for gout 09/24/13  Yes [name], NP  diphenhydrAMINE (BENADRYL) 25 MG tablet Take 50 mg by mouth at bedtime. For itching   Yes Historical Provider, MD  DULoxetine (CYMBALTA) 60 MG capsule Take 1 capsule by mouth every day for anxiety Patient taking differently: Take 60 mg by mouth daily. for anxiety 03/09/14  Yes Tiffany L Reed,  DO  furosemide (LASIX) 20 MG tablet Take one tablet by mouth twice daily for edema Patient taking differently: Take 20 mg by mouth 2 (two) times daily. for edema 09/04/14  Yes Gildardo Cranker, DO  guaiFENesin-dextromethorphan (ROBITUSSIN DM) 100-10 MG/5ML syrup Take 5 mLs by mouth every 4 (four) hours as needed for cough. 05/05/14  Yes Mahima Bubba Camp, MD  Insulin Lispro, Human, (HUMALOG KWIKPEN) 200 UNIT/ML SOPN Inject 35 Units into the skin 3 (three) times daily before meals. Patient taking differently: Inject 35 Units into the skin See admin instructions. Inject 35  units at each meal (most days twice daily, occasionally 3 times daily) 09/07/14  Yes Tivis Ringer, RPH-CPP  isosorbide mononitrate (IMDUR) 60 MG 24 hr tablet Take 1 tablet (60 mg total) by mouth daily. 03/09/14  Yes Tiffany L Reed, DO  Linaclotide (LINZESS) 145 MCG CAPS capsule Take 1 capsule (145 mcg total) by mouth daily. Sample provided 05/05/14  Yes Blanchie Serve, MD  metoprolol succinate (TOPROL-XL) 25 MG 24 hr tablet Take 1 tablet by mouth once every day for blood pressure Patient taking differently: Take 25 mg by mouth daily. for blood pressure 03/09/14  Yes Tiffany L Reed, DO  nitroGLYCERIN (NITROSTAT) 0.4 MG SL tablet Take one tablet under the tongue every 5 minutes as needed for chest pain Patient taking differently: Place 0.4 mg under the tongue every 5 (five) minutes as needed for chest pain.  09/24/13  Yes [name], NP  oxyCODONE-acetaminophen (PERCOCET) 10-325 MG per tablet Take one tablet every 6 hours as needed for pain Patient taking differently: Take 1 tablet by mouth every 6 (six) hours as needed for pain.  09/11/14  Yes Tiffany L Reed, DO  pantoprazole (PROTONIX) 40 MG tablet TAKE ONE TABLET BY MOUTH ONCE DAILY 08/11/14  Yes Gildardo Cranker, DO  pravastatin (PRAVACHOL) 20 MG tablet Take 1 tablet by mouth once every day for cholesterol Patient taking differently: Take 20 mg by mouth daily. for cholesterol 03/09/14  Yes  Tiffany L Reed, DO  temazepam (RESTORIL) 30 MG capsule One at bed for insomnia 09/23/14  Yes Estill Dooms, MD  warfarin (COUMADIN) 5 MG tablet Take 2 tablets (10 mg total) by mouth daily. Please take 7 mg oral daily on Tues, Thursday, Sat, then 5 mg on Mon, Wed Fri, Sun. Patient taking differently: Take 10 mg by mouth daily. Please take 7.5 mg oral daily on Tues, Thursday, Sat, then 5 mg on Mon, Wed Fri, Sun. 09/04/14  Yes Gildardo Cranker, DO  acetaminophen (TYLENOL) 325 MG tablet Take 2 tablets (650 mg total) by mouth every 6 (six) hours as needed for mild pain (or Fever >/= 101). Patient not taking: Reported on 09/24/2014 04/24/14   Delfina Redwood, MD  clonazePAM (KLONOPIN) 1 MG tablet TAKE 2 TABLETS AT BEDTIME FOR SLEEP 09/26/14   Barton Dubois, MD  Insulin Glargine (TOUJEO SOLOSTAR) 300 UNIT/ML SOPN Inject 60 Units into the skin 2 (two) times daily. 09/26/14   Barton Dubois, MD  meclizine (ANTIVERT) 25 MG tablet Take 1 tablet (25 mg total) by mouth 3 (three) times daily. 09/26/14   Barton Dubois, MD   BP 109/94 mmHg  Pulse 91  Temp(Src) 98.1 F (36.7 C) (Oral)  Resp 20  Wt 358 lb 12.8 oz (162.751 kg)  SpO2 98% Physical Exam  Constitutional: She is oriented to person, place, and time. She appears well-developed and well-nourished. No distress.  HENT:  Head: Normocephalic and atraumatic.  Mouth/Throat: Oropharynx is clear and moist.  Eyes: Conjunctivae and EOM are normal. Pupils are equal, round, and reactive to light.  Neck: Normal range of motion. Neck supple.  Cardiovascular: Normal rate, regular rhythm, normal heart sounds and intact distal pulses.  Exam reveals no gallop and no friction rub.   No murmur heard. Pulmonary/Chest: Effort normal and breath sounds normal. No respiratory distress. She has no wheezes. She has no rales.  Abdominal: Soft. Bowel sounds are normal. She exhibits no distension. There is no tenderness. There is no rebound and no guarding.  Musculoskeletal: Normal  range of motion. She exhibits  no edema or tenderness.  Neurological: She is alert and oriented to person, place, and time. She has normal strength. No cranial nerve deficit or sensory deficit. She exhibits normal muscle tone. Coordination normal. GCS eye subscore is 4. GCS verbal subscore is 5. GCS motor subscore is 6.  Pt unable to sit up or ambulate due to vertigo  Skin: Skin is warm and dry. No rash noted. She is not diaphoretic. No erythema. No pallor.  Nursing note and vitals reviewed.   ED Course  Procedures (including critical care time) Labs Review Labs Reviewed  COMPREHENSIVE METABOLIC PANEL - Abnormal; Notable for the following:    Potassium 5.3 (*)    Glucose, Bld 325 (*)    Creatinine, Ser 1.28 (*)    Total Protein 8.4 (*)    Albumin 2.9 (*)    GFR calc non Af Amer 39 (*)    GFR calc Af Amer 46 (*)    All other components within normal limits  CBC WITH DIFFERENTIAL/PLATELET - Abnormal; Notable for the following:    Hemoglobin 10.7 (*)    HCT 34.9 (*)    All other components within normal limits  URINALYSIS, ROUTINE W REFLEX MICROSCOPIC - Abnormal; Notable for the following:    APPearance CLOUDY (*)    Hgb urine dipstick TRACE (*)    Leukocytes, UA MODERATE (*)    All other components within normal limits  URINE MICROSCOPIC-ADD ON - Abnormal; Notable for the following:    Squamous Epithelial / LPF FEW (*)    Bacteria, UA MANY (*)    All other components within normal limits  BASIC METABOLIC PANEL - Abnormal; Notable for the following:    Potassium 5.3 (*)    Chloride 99 (*)    Glucose, Bld 283 (*)    BUN 22 (*)    Creatinine, Ser 1.32 (*)    Calcium 8.8 (*)    GFR calc non Af Amer 38 (*)    GFR calc Af Amer 44 (*)    All other components within normal limits  CBC - Abnormal; Notable for the following:    RBC 3.63 (*)    Hemoglobin 9.7 (*)    HCT 30.9 (*)    All other components within normal limits  SEDIMENTATION RATE - Abnormal; Notable for the following:     Sed Rate 110 (*)    All other components within normal limits  GLUCOSE, CAPILLARY - Abnormal; Notable for the following:    Glucose-Capillary 243 (*)    All other components within normal limits  PROTIME-INR - Abnormal; Notable for the following:    Prothrombin Time 16.4 (*)    All other components within normal limits  GLUCOSE, CAPILLARY - Abnormal; Notable for the following:    Glucose-Capillary 235 (*)    All other components within normal limits  GLUCOSE, CAPILLARY - Abnormal; Notable for the following:    Glucose-Capillary 264 (*)    All other components within normal limits  GLUCOSE, CAPILLARY - Abnormal; Notable for the following:    Glucose-Capillary 246 (*)    All other components within normal limits  PROTIME-INR - Abnormal; Notable for the following:    Prothrombin Time 16.2 (*)    All other components within normal limits  GLUCOSE, CAPILLARY - Abnormal; Notable for the following:    Glucose-Capillary 236 (*)    All other components within normal limits  GLUCOSE, CAPILLARY - Abnormal; Notable for the following:    Glucose-Capillary 130 (*)  All other components within normal limits  GLUCOSE, CAPILLARY  I-STAT TROPOININ, ED    Imaging Review Ct Head Wo Contrast  09/24/2014   CLINICAL DATA:  Dizziness.  Symptoms for 2 days.  EXAM: CT HEAD WITHOUT CONTRAST  TECHNIQUE: Contiguous axial images were obtained from the base of the skull through the vertex without intravenous contrast.  COMPARISON:  MR head performed earlier today.  FINDINGS: No evidence for acute infarction, hemorrhage, mass lesion, hydrocephalus, or extra-axial fluid. Cerebral and cerebellar atrophy. Hypoattenuation of white matter consistent with small vessel disease. No signs of large vessel occlusion. Hyperostosis of the calvarium. Ossification of the tentorium. No Acute sinus or mastoid disease. RIGHT concha bullosa. Negative orbits.  IMPRESSION: Chronic changes as described.  No acute intracranial  findings.   Electronically Signed   By: Rolla Flatten M.D.   On: 09/24/2014 21:19   Mr Brain Wo Contrast  09/24/2014   CLINICAL DATA:  Two days ago patient developed weakness, dizziness, and nausea. Chest pain also reported. Symptoms of vertigo.  EXAM: MRI HEAD WITHOUT CONTRAST RIGHT  TECHNIQUE: Multiplanar, multiecho pulse sequences of the brain and surrounding structures were obtained without intravenous contrast.  COMPARISON:  CT head 12/31/2011.  FINDINGS: The patient was unable to remain motionless for the exam. Small or subtle lesions could be overlooked.  No evidence for acute infarction, hemorrhage, mass lesion, hydrocephalus, or extra-axial fluid. There is artifactual restricted diffusion along the medial LEFT occipital lobe and superior vermis related to the tentorial ossification.  Generalized atrophy. Extensive small vessel disease. Flow voids are maintained. Extracranial soft tissues are unremarkable.  IMPRESSION: Motion degraded exam. No acute intracranial findings. Chronic changes as described.   Electronically Signed   By: Rolla Flatten M.D.   On: 09/24/2014 20:08     EKG Interpretation   Date/Time:  Thursday Sep 24 2014 15:34:21 EDT Ventricular Rate:  86 PR Interval:  174 QRS Duration: 100 QT Interval:  396 QTC Calculation: 474 R Axis:   -16 Text Interpretation:  Sinus rhythm Ventricular premature complex Left  ventricular hypertrophy No significant change since last tracing Confirmed  by BEATON  MD, ROBERT ([id number]) on 09/24/2014 5:35:22 PM      MDM   Final diagnoses:  Vertigo    80 yo F with PMH of CAD, PE, CHF, HTN, DM, HPL, presenting with dizziness, vertigo.  Onset 2 days ago, persistent without improvement during this time.  Reports inability to walk due to spinning sensation.  Mild associated HA, gradual onset.  +Nausea, no vomiting.  Also endorses mild chest pressure.  No SOB.  Denies neck pain vision changes or diplopia, weakness, numbness.  On presentation, pt  alert, VSS, appears in discomfort, closing eyes and not moving head.  Neuro exam with no focal deficits, however unable to sit up or ambulate due to dizziness.  No other acute findings on exam.  Possible posterior CVA vs BPPV.  Plan for MR brain, labs.  EKG without acute ischemic changes.  Valium given for vertigo.  Imaging shows no evidence of acute infarct or other abnormality.  Pt remains unable to ambulate despite symptomatic treatment.  Neuro consulted, advise admission and further symptomatic tx.   Other acute events during my care.  Discussed with attending Dr. Audie Pinto.      Ellwood Dense, MD 09/26/14 1453  Leonard Schwartz, MD 10/02/14 [phone]

## 2014-09-24 NOTE — ED Notes (Addendum)
2 days ago pt reports she was weak, dizziness, nausea. No vomiting or diarr reported. Chest pain also reported now. Reports she has felt like this before when she had vertigo. Feels like she is spinning.

## 2014-09-24 NOTE — H&P (Signed)
[facility] History and Physical  [name] [id number] DOB: August 25, 1934 DOA: 09/24/2014  Referring physician: Dr. Rogers Blocker. PCP: Gildardo Cranker, DO  Specialists: [facility] cardiology.  Chief Complaint: Dizziness.  HPI: [name] is a 80 y.o. female with history of PE/DVT on Coumadin, CAD status post stenting, diabetes mellitus type 2, morbid obesity presents to the ER because of worsening dizziness over the last 2 days. Patient was originally planned to have a biopsy of the breast area for a mass for which Coumadin has been held for last 2 days was found to have elevated blood sugar and was referred to patient's PCP. Following his face started developing dizziness on minimal exertion and all movements. Patient has developed some left ear pain and headache at the same time. Denies any nausea vomiting. In the ER patient had MRI brain which was negative for anything acute and on-call neurologist Dr. Wallie Char was consulted and Dr. Nicole Kindred has recommended admission for observation and benzodiazepine and meclizine and PT consult. On exam patient is nonfocal. Denies any chest pain or shortness of breath or palpitations.   Review of Systems: As presented in the history of presenting illness, rest negative.  Past Medical History  Diagnosis Date  . Hyperlipidemia     takes Pravastatin daily  . Coronary artery disease     a. s/p IMI 2004 tx with BMS to RCA;  b. s/p Promus DES to RCA 2/12 (cath: LM 20-30%, pLAD 20-30%, mLAD 50%, RI 30%, pRCA 60%, mRCA 90% - tx with PCI);  c. myoview 1/07: EF 49%, inf scar, no isch  . Urinary incontinence     takes Linzess daily  . Insomnia   . Osteoarthritis   . Obstructive sleep apnea   . Polymyalgia rheumatica   . Arthritis   . Pulmonary emboli 9/13    felt to need lifelong anticoagulation  . Allergy     takes Mucinex daily as needed  . Gout, unspecified     takes Colchicine daily  . Dysuria   . Pain, chronic   . Osteoarthrosis,  unspecified whether generalized or localized, lower leg   . Anemia, unspecified   . Coronary atherosclerosis of native coronary artery   . CHF (congestive heart failure)     takes Furosemide daily  . Depression     takes Cymbalta daily  . Anxiety     takes Clonazepam daily as needed  . Hypertension     takes Imdur and Metoprolol daily  . GERD (gastroesophageal reflux disease)     takes Protonix daily  . Diabetes mellitus     insulin daily  . Type II or unspecified type diabetes mellitus without mention of complication, uncontrolled   . Myocardial infarction 2004  . History of blood clots about 59yrs ago    in legs-takes Coumadin   . Shortness of breath dyspnea     with exertion and has Albuterol inhaler prn  . Headache     occasionally  . Vertigo     hx of;was taking Meclizine if needed  . Numbness   . Joint pain   . Joint swelling   . Osteoarthritis    Past Surgical History  Procedure Laterality Date  . Appendectomy    . Cardiac catheterization    . Gastric bypass  1977     reversed in 1979, [facility]  . Blood clots/legs and lungs  2013  . Mi with stent placement  2004  . Left heart catheterization with coronary angiogram N/A  06/29/2014    Procedure: LEFT HEART CATHETERIZATION WITH CORONARY ANGIOGRAM;  Surgeon: Troy Sine, MD;  Location: [facility] CATH LAB;  Service: Cardiovascular;  Laterality: N/A;  . Coronary angioplasty  2  . Eye surgery Bilateral     cataract   . Colonoscopy    . Abdominal hysterectomy      partial   Social History:  reports that she has never smoked. She does not have any smokeless tobacco history on file. She reports that she does not drink alcohol or use illicit drugs. Where does patient live home. Can patient participate in ADLs? Yes.  Allergies  Allergen Reactions  . Sulfonamide Derivatives Swelling    Mouth swelling  . Tramadol Nausea And Vomiting    Family History:  Family History  Problem Relation Age of Onset  . Breast cancer  Mother   . Heart disease Mother   . Throat cancer Father   . Hypertension Father   . Arthritis Father   . Diabetes Father   . Arthritis Sister   . Obesity Sister   . Diabetes Sister       Prior to Admission medications   Medication Sig Start Date End Date Taking? Authorizing Provider  albuterol (PROVENTIL HFA;VENTOLIN HFA) 108 (90 BASE) MCG/ACT inhaler Inhale 2 puffs into the lungs every 6 (six) hours as needed for wheezing or shortness of breath. 05/05/14  Yes Mahima Bubba Camp, MD  budesonide-formoterol (SYMBICORT) 160-4.5 MCG/ACT inhaler In 2 puffs twice daily to help wheezing 09/23/14  Yes Estill Dooms, MD  Calcium Carbonate Antacid (TUMS PO) Take 3 tablets by mouth daily as needed (acid reflux).   Yes Historical Provider, MD  Cinnamon 500 MG capsule Take 500 mg by mouth 2 (two) times daily.   Yes Historical Provider, MD  clonazePAM (KLONOPIN) 1 MG tablet TAKE 1 TABLET BY MOUTH EVERY MORNING AND 2 TABLETS AT BEDTIME FOR SLEEP Patient taking differently: TAKE 2 TABLETS AT BEDTIME FOR SLEEP 08/11/14  Yes Gildardo Cranker, DO  colchicine (COLCRYS) 0.6 MG tablet Take 1 tablet by mouth every day for gout Patient taking differently: Take 0.6 mg by mouth daily. for gout 09/24/13  Yes [name], NP  diphenhydrAMINE (BENADRYL) 25 MG tablet Take 50 mg by mouth at bedtime. For itching   Yes Historical Provider, MD  DULoxetine (CYMBALTA) 60 MG capsule Take 1 capsule by mouth every day for anxiety Patient taking differently: Take 60 mg by mouth daily. for anxiety 03/09/14  Yes Tiffany L Reed, DO  furosemide (LASIX) 20 MG tablet Take one tablet by mouth twice daily for edema Patient taking differently: Take 20 mg by mouth 2 (two) times daily. for edema 09/04/14  Yes Gildardo Cranker, DO  guaiFENesin-dextromethorphan (ROBITUSSIN DM) 100-10 MG/5ML syrup Take 5 mLs by mouth every 4 (four) hours as needed for cough. 05/05/14  Yes Mahima Bubba Camp, MD  Insulin Glargine (TOUJEO SOLOSTAR) 300 UNIT/ML SOPN Inject  100 Units into the skin daily. Patient taking differently: Inject 110 Units into the skin at bedtime. Inject 55 units in each arm at bedtime (total 110 units) 08/10/14  Yes Cathey Miller, RPH-CPP  Insulin Lispro, Human, (HUMALOG KWIKPEN) 200 UNIT/ML SOPN Inject 35 Units into the skin 3 (three) times daily before meals. Patient taking differently: Inject 35 Units into the skin See admin instructions. Inject 35 units at each meal (most days twice daily, occasionally 3 times daily) 09/07/14  Yes Tivis Ringer, RPH-CPP  isosorbide mononitrate (IMDUR) 60 MG 24 hr tablet Take 1 tablet (60 mg  total) by mouth daily. 03/09/14  Yes Tiffany L Reed, DO  Linaclotide (LINZESS) 145 MCG CAPS capsule Take 1 capsule (145 mcg total) by mouth daily. Sample provided 05/05/14  Yes Blanchie Serve, MD  metoprolol succinate (TOPROL-XL) 25 MG 24 hr tablet Take 1 tablet by mouth once every day for blood pressure Patient taking differently: Take 25 mg by mouth daily. for blood pressure 03/09/14  Yes Tiffany L Reed, DO  nitroGLYCERIN (NITROSTAT) 0.4 MG SL tablet Take one tablet under the tongue every 5 minutes as needed for chest pain Patient taking differently: Place 0.4 mg under the tongue every 5 (five) minutes as needed for chest pain.  09/24/13  Yes [name], NP  oxyCODONE-acetaminophen (PERCOCET) 10-325 MG per tablet Take one tablet every 6 hours as needed for pain Patient taking differently: Take 1 tablet by mouth every 6 (six) hours as needed for pain.  09/11/14  Yes Tiffany L Reed, DO  pantoprazole (PROTONIX) 40 MG tablet TAKE ONE TABLET BY MOUTH ONCE DAILY 08/11/14  Yes Gildardo Cranker, DO  pravastatin (PRAVACHOL) 20 MG tablet Take 1 tablet by mouth once every day for cholesterol Patient taking differently: Take 20 mg by mouth daily. for cholesterol 03/09/14  Yes Tiffany L Reed, DO  temazepam (RESTORIL) 30 MG capsule One at bed for insomnia 09/23/14  Yes Estill Dooms, MD  warfarin (COUMADIN) 5 MG tablet Take 2 tablets (10  mg total) by mouth daily. Please take 7 mg oral daily on Tues, Thursday, Sat, then 5 mg on Mon, Wed Fri, Sun. Patient taking differently: Take 10 mg by mouth daily. Please take 7.5 mg oral daily on Tues, Thursday, Sat, then 5 mg on Mon, Wed Fri, Sun. 09/04/14  Yes Gildardo Cranker, DO  acetaminophen (TYLENOL) 325 MG tablet Take 2 tablets (650 mg total) by mouth every 6 (six) hours as needed for mild pain (or Fever >/= 101). Patient not taking: Reported on 09/24/2014 04/24/14   Delfina Redwood, MD  albuterol (PROVENTIL HFA;VENTOLIN HFA) 108 (90 BASE) MCG/ACT inhaler Inhale 2 puffs into the lungs every 6 (six) hours as needed for wheezing or shortness of breath. Patient not taking: Reported on 09/24/2014 09/04/14   Gildardo Cranker, DO    Physical Exam: Filed Vitals:   09/24/14 2200 09/24/14 2215 09/24/14 2246 09/24/14 2312  BP: 135/112 140/68 134/61 110/54  Pulse: 84 85 84 84  Temp:    98 F (36.7 C)  TempSrc:    Oral  Resp: 22 18 17 18   SpO2: 100% 98% 100% 99%     General:  Obese no distress.  Eyes: Anicteric pallor.  ENT: No discharge from ears eyes nose and mouth.  Neck: No mass felt.  Cardiovascular: S1 and S2 heard.  Respiratory: No rhonchi or crepitations.  Abdomen: Soft nontender bowel sounds present.  Skin: No rash.  Musculoskeletal: No edema.  Psychiatric: Appears normal.  Neurologic: Alert awake oriented to time place and person. No nystagmus. No focal deficits. Perla positive. Tongue is midline.  Labs on Admission:  Basic Metabolic Panel:  Recent Labs Lab 09/24/14 1459  NA 136  K 5.3*  CL 101  CO2 25  GLUCOSE 325*  BUN 20  CREATININE 1.28*  CALCIUM 8.9   Liver Function Tests:  Recent Labs Lab 09/24/14 1459  AST 22  ALT 21  ALKPHOS 84  BILITOT 0.4  PROT 8.4*  ALBUMIN 2.9*   No results for input(s): LIPASE, AMYLASE in the last 168 hours. No results for input(s): AMMONIA  in the last 168 hours. CBC:  Recent Labs Lab 09/24/14 1459  WBC 8.9   NEUTROABS 5.9  HGB 10.7*  HCT 34.9*  MCV 85.7  PLT 319   Cardiac Enzymes: No results for input(s): CKTOTAL, CKMB, CKMBINDEX, TROPONINI in the last 168 hours.  BNP (last 3 results)  Recent Labs  06/24/14 1821  BNP 62.6    ProBNP (last 3 results)  Recent Labs  10/14/13 1644  PROBNP 150.2    CBG: No results for input(s): GLUCAP in the last 168 hours.  Radiological Exams on Admission: Ct Head Wo Contrast  09/24/2014   CLINICAL DATA:  Dizziness.  Symptoms for 2 days.  EXAM: CT HEAD WITHOUT CONTRAST  TECHNIQUE: Contiguous axial images were obtained from the base of the skull through the vertex without intravenous contrast.  COMPARISON:  MR head performed earlier today.  FINDINGS: No evidence for acute infarction, hemorrhage, mass lesion, hydrocephalus, or extra-axial fluid. Cerebral and cerebellar atrophy. Hypoattenuation of white matter consistent with small vessel disease. No signs of large vessel occlusion. Hyperostosis of the calvarium. Ossification of the tentorium. No Acute sinus or mastoid disease. RIGHT concha bullosa. Negative orbits.  IMPRESSION: Chronic changes as described.  No acute intracranial findings.   Electronically Signed   By: Rolla Flatten M.D.   On: 09/24/2014 21:19   Mr Brain Wo Contrast  09/24/2014   CLINICAL DATA:  Two days ago patient developed weakness, dizziness, and nausea. Chest pain also reported. Symptoms of vertigo.  EXAM: MRI HEAD WITHOUT CONTRAST RIGHT  TECHNIQUE: Multiplanar, multiecho pulse sequences of the brain and surrounding structures were obtained without intravenous contrast.  COMPARISON:  CT head 12/31/2011.  FINDINGS: The patient was unable to remain motionless for the exam. Small or subtle lesions could be overlooked.  No evidence for acute infarction, hemorrhage, mass lesion, hydrocephalus, or extra-axial fluid. There is artifactual restricted diffusion along the medial LEFT occipital lobe and superior vermis related to the tentorial  ossification.  Generalized atrophy. Extensive small vessel disease. Flow voids are maintained. Extracranial soft tissues are unremarkable.  IMPRESSION: Motion degraded exam. No acute intracranial findings. Chronic changes as described.   Electronically Signed   By: Rolla Flatten M.D.   On: 09/24/2014 20:08    EKG: Independently reviewed. Normal sinus rhythm with PVCs and LVH.  Assessment/Plan Principal Problem:   Vertigo Active Problems:   Hyperlipidemia   OBESITY, MORBID   Essential hypertension   Pulmonary embolus   Chronic diastolic congestive heart failure   Diabetes mellitus type 2, uncontrolled   1. Vertigo - patient's symptoms are consistent with benign positional vertigo. Patient neurology consult and recommendations. I have reviewed the neurologist notes. Patient has been placed on Valium 2 mg when necessary and meclizine when necessary and have consulted physical therapy. 2. Headache - patient does complain of some left ear pain and headache and the chart mentions polymyalgia rheumatica history or sedimentation rate. At this time there is no obvious tenderness over the years of discharge. Closely observe. 3. Diabetes mellitus type 2 uncontrolled - patient states she takes her medications but she eats a lot of sweets for which I have requested dietitian consult. Continue home medications with sliding scale coverage. 4. History of PE and DVT on Coumadin - patient's Coumadin was hold for the last 2 days for possible biopsy of the breast mass. Coumadin will be dosed per pharmacy. 5. Chronic diastolic CHF last year measured was 60-65% - appears compensated. 6. Chronic kidney disease stage III - creatinine appears  to be at baseline. 7. Chronic anemia - follow CBC. 8. CAD status post last cardiac cath in February 2016 - continue present medications. 9. History of OSA noncompliant with CPAP.    DVT ProphylaxisCoumadin.   Code Status: Full code.   Family Communication: Family at the  bedside.   Disposition Plan: Admit for observation.   Osborne Serio N. [facility] Pager [phone].  If 7PM-7AM, please contact night-coverage [email or web address] Password [facility] 09/24/2014, 11:42 PM

## 2014-09-24 NOTE — ED Notes (Signed)
MRI will notify RN 15 mins prior to coming to take pt.

## 2014-09-24 NOTE — ED Notes (Signed)
Attempted to call report

## 2014-09-24 NOTE — Consult Note (Signed)
Admission H&P    Chief Complaint: Vertigo for opiates 72 hours.  HPI: [name] is an 80 y.o. female history of hyperlipidemia, hypertension, ingestion of heart failure, diabetes mellitus, vertigo, depression and anxiety, presenting with progressive vertigo which started 2 days ago. She's had intermittent nausea but no vomiting. In terms of worsened with movement as well as with eyes open. She has not experienced focal motor or neurosensory deficit. She's had no change in speech or swallowing. Patient is on anticoagulation with Coumadin and INR is pending. MRI of the brain showed no acute intracranial abnormality. Patient was given diazepam, as well as Ativan after arriving in the emergency room. There was no improvement in terms of vertigo. She's being admitted for observation and management of intractable vertigo with instability with standing and walking.  Past Medical History  Diagnosis Date  . Hyperlipidemia     takes Pravastatin daily  . Coronary artery disease     a. s/p IMI 2004 tx with BMS to RCA;  b. s/p Promus DES to RCA 2/12 (cath: LM 20-30%, pLAD 20-30%, mLAD 50%, RI 30%, pRCA 60%, mRCA 90% - tx with PCI);  c. myoview 1/07: EF 49%, inf scar, no isch  . Urinary incontinence     takes Linzess daily  . Insomnia   . Osteoarthritis   . Obstructive sleep apnea   . Polymyalgia rheumatica   . Arthritis   . Pulmonary emboli 9/13    felt to need lifelong anticoagulation  . Allergy     takes Mucinex daily as needed  . Gout, unspecified     takes Colchicine daily  . Dysuria   . Pain, chronic   . Osteoarthrosis, unspecified whether generalized or localized, lower leg   . Anemia, unspecified   . Coronary atherosclerosis of native coronary artery   . CHF (congestive heart failure)     takes Furosemide daily  . Depression     takes Cymbalta daily  . Anxiety     takes Clonazepam daily as needed  . Hypertension     takes Imdur and Metoprolol daily  . GERD (gastroesophageal  reflux disease)     takes Protonix daily  . Diabetes mellitus     insulin daily  . Type II or unspecified type diabetes mellitus without mention of complication, uncontrolled   . Myocardial infarction 2004  . History of blood clots about 51yr ago    in legs-takes Coumadin   . Shortness of breath dyspnea     with exertion and has Albuterol inhaler prn  . Headache     occasionally  . Vertigo     hx of;was taking Meclizine if needed  . Numbness   . Joint pain   . Joint swelling   . Osteoarthritis     Past Surgical History  Procedure Laterality Date  . Appendectomy    . Cardiac catheterization    . Gastric bypass  1977     [facility] Blood clots/legs and lungs  2013  . Mi with stent placement  2004  . Left heart catheterization with coronary angiogram N/A 06/29/2014    Procedure: LEFT HEART CATHETERIZATION WITH CORONARY ANGIOGRAM;  Surgeon: TTroy Sine MD;  [facility];  Service: Cardiovascular;  Laterality: N/A;  . Coronary angioplasty  2  . Eye surgery Bilateral     cataract   . Colonoscopy    . Abdominal hysterectomy      partial    Family  History  Problem Relation Age of Onset  . Breast cancer Mother   . Heart disease Mother   . Throat cancer Father   . Hypertension Father   . Arthritis Father   . Diabetes Father   . Arthritis Sister   . Obesity Sister   . Diabetes Sister    Social History:  reports that she has never smoked. She does not have any smokeless tobacco history on file. She reports that she does not drink alcohol or use illicit drugs.  Allergies:  Allergies  Allergen Reactions  . Sulfonamide Derivatives Swelling    Mouth swelling  . Tramadol Nausea And Vomiting    Medications: Patient's preadmission medications were reviewed by me.  ROS: History obtained from the patient  General ROS: negative for - chills, fatigue, fever, night sweats, weight gain or weight loss Psychological ROS: negative for -  behavioral disorder, hallucinations, memory difficulties, mood swings or suicidal ideation Ophthalmic ROS: negative for - blurry vision, double vision, eye pain or loss of vision ENT ROS: negative for - epistaxis, nasal discharge, oral lesions, sore throat, tinnitus or vertigo Allergy and Immunology ROS: negative for - hives or itchy/watery eyes Hematological and Lymphatic ROS: negative for - bleeding problems, bruising or swollen lymph nodes Endocrine ROS: negative for - galactorrhea, hair pattern changes, polydipsia/polyuria or temperature intolerance Respiratory ROS: negative for - cough, hemoptysis, shortness of breath or wheezing Cardiovascular ROS: negative for - chest pain, dyspnea on exertion, edema or irregular heartbeat Gastrointestinal ROS: negative for - abdominal pain, diarrhea, hematemesis, nausea/vomiting or stool incontinence Genito-Urinary ROS: negative for - dysuria, hematuria, incontinence or urinary frequency/urgency Musculoskeletal ROS: negative for - joint swelling or muscular weakness Neurological ROS: as noted in HPI Dermatological ROS: negative for rash and skin lesion changes  Physical Examination: Blood pressure 154/71, pulse 84, temperature 97.9 F (36.6 C), temperature source Oral, resp. rate 14, SpO2 100 %.  HEENT-  Normocephalic, no lesions, without obvious abnormality.  Normal external eye and conjunctiva.  Normal TM's bilaterally.  Normal auditory canals and external ears. Normal external nose, mucus membranes and septum.  Normal pharynx. Neck supple with no masses, nodes, nodules or enlargement. Cardiovascular - regular rate and rhythm, S1, S2 normal, no murmur, click, rub or gallop Lungs - chest clear, no wheezing, rales, normal symmetric air entry Abdomen - soft, non-tender; bowel sounds normal; no masses,  no organomegaly Extremities - no joint deformities, effusion, or inflammation  Neurologic Examination: Mental Status: Alert, oriented, thought  content appropriate.  Speech fluent without evidence of aphasia. Able to follow commands without difficulty. Cranial Nerves: II-Visual fields were normal. III/IV/VI-Pupils were equal and reacted. Extraocular movements were full and conjugate. No nystagmus noted. V/VII-no facial numbness and no facial weakness. VIII-normal. X-normal speech and symmetrical palatal movement. XI: trapezius strength/neck flexion strength normal bilaterally XII-midline tongue extension with normal strength. Motor: 5/5 bilaterally with normal tone and bulk Sensory: Normal throughout. Deep Tendon Reflexes: Trace to 1+ and symmetric. Plantars: Mute bilaterally Cerebellar: Normal finger-to-nose testing. Carotid auscultation: Normal  Results for orders placed or performed during the hospital encounter of 09/24/14 (from the past 48 hour(s))  Comprehensive metabolic panel     Status: Abnormal   Collection Time: 09/24/14  2:59 PM  Result Value Ref Range   Sodium 136 135 - 145 mmol/L   Potassium 5.3 (H) 3.5 - 5.1 mmol/L   Chloride 101 101 - 111 mmol/L   CO2 25 22 - 32 mmol/L   Glucose, Bld 325 (H) 65 - 99  mg/dL   BUN 20 6 - 20 mg/dL   Creatinine, Ser 1.28 (H) 0.44 - 1.00 mg/dL   Calcium 8.9 8.9 - 10.3 mg/dL   Total Protein 8.4 (H) 6.5 - 8.1 g/dL   Albumin 2.9 (L) 3.5 - 5.0 g/dL   AST 22 15 - 41 U/L   ALT 21 14 - 54 U/L   Alkaline Phosphatase 84 38 - 126 U/L   Total Bilirubin 0.4 0.3 - 1.2 mg/dL   GFR calc non Af Amer 39 (L) >60 mL/min   GFR calc Af Amer 46 (L) >60 mL/min    Comment: (NOTE) The eGFR has been calculated using the CKD EPI equation. This calculation has not been validated in all clinical situations. eGFR's persistently <60 mL/min signify possible Chronic Kidney Disease.    Anion gap 10 5 - 15  CBC with Differential     Status: Abnormal   Collection Time: 09/24/14  2:59 PM  Result Value Ref Range   WBC 8.9 4.0 - 10.5 K/uL   RBC 4.07 3.87 - 5.11 MIL/uL   Hemoglobin 10.7 (L) 12.0 - 15.0  g/dL   HCT 34.9 (L) 36.0 - 46.0 %   MCV 85.7 78.0 - 100.0 fL   MCH 26.3 26.0 - 34.0 pg   MCHC 30.7 30.0 - 36.0 g/dL   RDW 15.1 11.5 - 15.5 %   Platelets 319 150 - 400 K/uL   Neutrophils Relative % 66 43 - 77 %   Neutro Abs 5.9 1.7 - 7.7 K/uL   Lymphocytes Relative 24 12 - 46 %   Lymphs Abs 2.1 0.7 - 4.0 K/uL   Monocytes Relative 6 3 - 12 %   Monocytes Absolute 0.5 0.1 - 1.0 K/uL   Eosinophils Relative 4 0 - 5 %   Eosinophils Absolute 0.4 0.0 - 0.7 K/uL   Basophils Relative 0 0 - 1 %   Basophils Absolute 0.0 0.0 - 0.1 K/uL  I-stat troponin, ED     Status: None   Collection Time: 09/24/14  3:50 PM  Result Value Ref Range   Troponin i, poc 0.01 0.00 - 0.08 ng/mL   Comment 3            Comment: Due to the release kinetics of cTnI, a negative result within the first hours of the onset of symptoms does not rule out myocardial infarction with certainty. If myocardial infarction is still suspected, repeat the test at appropriate intervals.    Mr Brain Wo Contrast  09/24/2014   CLINICAL DATA:  Two days ago patient developed weakness, dizziness, and nausea. Chest pain also reported. Symptoms of vertigo.  EXAM: MRI HEAD WITHOUT CONTRAST RIGHT  TECHNIQUE: Multiplanar, multiecho pulse sequences of the brain and surrounding structures were obtained without intravenous contrast.  COMPARISON:  CT head 12/31/2011.  FINDINGS: The patient was unable to remain motionless for the exam. Small or subtle lesions could be overlooked.  No evidence for acute infarction, hemorrhage, mass lesion, hydrocephalus, or extra-axial fluid. There is artifactual restricted diffusion along the medial LEFT occipital lobe and superior vermis related to the tentorial ossification.  Generalized atrophy. Extensive small vessel disease. Flow voids are maintained. Extracranial soft tissues are unremarkable.  IMPRESSION: Motion degraded exam. No acute intracranial findings. Chronic changes as described.   Electronically Signed    By: Rolla Flatten M.D.   On: 09/24/2014 20:08    Assessment/Plan 80 year old lady with a history of vertigo presenting with recurrent severe vertigo, unresponsive to benzodiazepine medication. Neurological exam  showed no focal findings. MRI showed no signs of an acute stroke.  Recommendations: 1. Continue diazepam 1-2 mg every 6 hours when necessary for vertigo. 2. Meclizine 25 mg by mouth every 6 hours when necessary for vertigo. 3. Physical therapy consult for vestibular therapy. 4. Coumadin management per pharmacy consult.  C.R. Nicole Kindred, [facility] [phone]  09/24/2014, 9:08 PM

## 2014-09-25 DIAGNOSIS — E1165 Type 2 diabetes mellitus with hyperglycemia: Secondary | ICD-10-CM | POA: Diagnosis not present

## 2014-09-25 DIAGNOSIS — I10 Essential (primary) hypertension: Secondary | ICD-10-CM | POA: Diagnosis not present

## 2014-09-25 DIAGNOSIS — R42 Dizziness and giddiness: Secondary | ICD-10-CM | POA: Diagnosis not present

## 2014-09-25 DIAGNOSIS — I5032 Chronic diastolic (congestive) heart failure: Secondary | ICD-10-CM | POA: Diagnosis not present

## 2014-09-25 DIAGNOSIS — I2699 Other pulmonary embolism without acute cor pulmonale: Secondary | ICD-10-CM

## 2014-09-25 LAB — CBC
HCT: 30.9 % — ABNORMAL LOW (ref 36.0–46.0)
Hemoglobin: 9.7 g/dL — ABNORMAL LOW (ref 12.0–15.0)
MCH: 26.7 pg (ref 26.0–34.0)
MCHC: 31.4 g/dL (ref 30.0–36.0)
MCV: 85.1 fL (ref 78.0–100.0)
Platelets: 306 10*3/uL (ref 150–400)
RBC: 3.63 MIL/uL — ABNORMAL LOW (ref 3.87–5.11)
RDW: 15 % (ref 11.5–15.5)
WBC: 10.4 10*3/uL (ref 4.0–10.5)

## 2014-09-25 LAB — GLUCOSE, CAPILLARY
Glucose-Capillary: 235 mg/dL — ABNORMAL HIGH (ref 65–99)
Glucose-Capillary: 236 mg/dL — ABNORMAL HIGH (ref 65–99)
Glucose-Capillary: 243 mg/dL — ABNORMAL HIGH (ref 65–99)
Glucose-Capillary: 246 mg/dL — ABNORMAL HIGH (ref 65–99)
Glucose-Capillary: 264 mg/dL — ABNORMAL HIGH (ref 65–99)

## 2014-09-25 LAB — BASIC METABOLIC PANEL
Anion gap: 9 (ref 5–15)
BUN: 22 mg/dL — ABNORMAL HIGH (ref 6–20)
CO2: 27 mmol/L (ref 22–32)
Calcium: 8.8 mg/dL — ABNORMAL LOW (ref 8.9–10.3)
Chloride: 99 mmol/L — ABNORMAL LOW (ref 101–111)
Creatinine, Ser: 1.32 mg/dL — ABNORMAL HIGH (ref 0.44–1.00)
GFR calc Af Amer: 44 mL/min — ABNORMAL LOW (ref >60)
GFR calc non Af Amer: 38 mL/min — ABNORMAL LOW (ref >60)
Glucose, Bld: 283 mg/dL — ABNORMAL HIGH (ref 65–99)
Potassium: 5.3 mmol/L — ABNORMAL HIGH (ref 3.5–5.1)
Sodium: 135 mmol/L (ref 135–145)

## 2014-09-25 LAB — SEDIMENTATION RATE: Sed Rate: 110 mm/hr — ABNORMAL HIGH (ref 0–22)

## 2014-09-25 LAB — PROTIME-INR
INR: 1.31 (ref 0.00–1.49)
Prothrombin Time: 16.4 seconds — ABNORMAL HIGH (ref 11.6–15.2)

## 2014-09-25 MED ORDER — OXYCODONE-ACETAMINOPHEN 5-325 MG PO TABS: 1.0000 | ORAL_TABLET | Freq: Four times a day (QID) | ORAL | Status: DC | PRN

## 2014-09-25 MED ORDER — MECLIZINE HCL 25 MG PO TABS
25.0000 mg | ORAL_TABLET | Freq: Three times a day (TID) | ORAL | Status: DC
  Administered 2014-09-25 – 2014-09-26 (×4): 25 mg via ORAL
  Filled 2014-09-25 (×6): qty 1

## 2014-09-25 MED ORDER — ZOLPIDEM TARTRATE 5 MG PO TABS: 5.0000 mg | ORAL_TABLET | Freq: Every evening | ORAL | Status: DC | PRN

## 2014-09-25 MED ORDER — WARFARIN - PHARMACIST DOSING INPATIENT
Freq: Every day | Status: DC
  Administered 2014-09-25: 18:00:00

## 2014-09-25 MED ORDER — WARFARIN SODIUM 5 MG PO TABS
5.0000 mg | ORAL_TABLET | ORAL | Status: DC
  Administered 2014-09-25: 5 mg via ORAL
  Filled 2014-09-25: qty 1

## 2014-09-25 MED ORDER — INSULIN ASPART 100 UNIT/ML ~~LOC~~ SOLN
35.0000 [IU] | Freq: Three times a day (TID) | SUBCUTANEOUS | Status: DC
  Administered 2014-09-25 – 2014-09-26 (×3): 35 [IU] via SUBCUTANEOUS

## 2014-09-25 MED ORDER — INSULIN GLARGINE 100 UNIT/ML ~~LOC~~ SOLN
110.0000 [IU] | Freq: Every day | SUBCUTANEOUS | Status: DC
Start: 2014-09-25 — End: 2014-09-26
  Administered 2014-09-25 (×2): 110 [IU] via SUBCUTANEOUS
  Filled 2014-09-25 (×3): qty 1.1

## 2014-09-25 MED ORDER — OXYCODONE HCL 5 MG PO TABS: 5.0000 mg | ORAL_TABLET | Freq: Four times a day (QID) | ORAL | Status: DC | PRN

## 2014-09-25 MED ORDER — WARFARIN SODIUM 7.5 MG PO TABS
7.5000 mg | ORAL_TABLET | ORAL | Status: DC
  Filled 2014-09-25: qty 1

## 2014-09-25 NOTE — Progress Notes (Signed)
ANTICOAGULATION CONSULT NOTE - Initial Consult  Pharmacy Consult for Coumadin Indication: pulmonary embolus  Allergies  Allergen Reactions  . Sulfonamide Derivatives Swelling    Mouth swelling  . Tramadol Nausea And Vomiting   Vital Signs: Temp: 98 F (36.7 C) (05/19 2312) Temp Source: Oral (05/19 2312) BP: 110/54 mmHg (05/19 2312) Pulse Rate: 84 (05/19 2312)  Labs:  Recent Labs  09/24/14 1459  HGB 10.7*  HCT 34.9*  PLT 319  CREATININE 1.28*    Estimated Creatinine Clearance: 60.1 mL/min (by C-G formula based on Cr of 1.28).   Medical History: Past Medical History  Diagnosis Date  . Hyperlipidemia     takes Pravastatin daily  . Coronary artery disease     a. s/p IMI 2004 tx with BMS to RCA;  b. s/p Promus DES to RCA 2/12 (cath: LM 20-30%, pLAD 20-30%, mLAD 50%, RI 30%, pRCA 60%, mRCA 90% - tx with PCI);  c. myoview 1/07: EF 49%, inf scar, no isch  . Urinary incontinence     takes Linzess daily  . Insomnia   . Osteoarthritis   . Obstructive sleep apnea   . Polymyalgia rheumatica   . Arthritis   . Pulmonary emboli 9/13    felt to need lifelong anticoagulation  . Allergy     takes Mucinex daily as needed  . Gout, unspecified     takes Colchicine daily  . Dysuria   . Pain, chronic   . Osteoarthrosis, unspecified whether generalized or localized, lower leg   . Anemia, unspecified   . Coronary atherosclerosis of native coronary artery   . CHF (congestive heart failure)     takes Furosemide daily  . Depression     takes Cymbalta daily  . Anxiety     takes Clonazepam daily as needed  . Hypertension     takes Imdur and Metoprolol daily  . GERD (gastroesophageal reflux disease)     takes Protonix daily  . Diabetes mellitus     insulin daily  . Type II or unspecified type diabetes mellitus without mention of complication, uncontrolled   . Myocardial infarction 2004  . History of blood clots about 92yrs ago    in legs-takes Coumadin   . Shortness of breath  dyspnea     with exertion and has Albuterol inhaler prn  . Headache     occasionally  . Vertigo     hx of;was taking Meclizine if needed  . Numbness   . Joint pain   . Joint swelling   . Osteoarthritis     Medications:  Prescriptions prior to admission  Medication Sig Dispense Refill Last Dose  . albuterol (PROVENTIL HFA;VENTOLIN HFA) 108 (90 BASE) MCG/ACT inhaler Inhale 2 puffs into the lungs every 6 (six) hours as needed for wheezing or shortness of breath. 1 Inhaler 0 09/23/2014 at Unknown time  . budesonide-formoterol (SYMBICORT) 160-4.5 MCG/ACT inhaler In 2 puffs twice daily to help wheezing 1 Inhaler 3 09/24/2014 at Unknown time  . Calcium Carbonate Antacid (TUMS PO) Take 3 tablets by mouth daily as needed (acid reflux).   09/23/2014 at Unknown time  . Cinnamon 500 MG capsule Take 500 mg by mouth 2 (two) times daily.   09/24/2014 at Unknown time  . clonazePAM (KLONOPIN) 1 MG tablet TAKE 1 TABLET BY MOUTH EVERY MORNING AND 2 TABLETS AT BEDTIME FOR SLEEP (Patient taking differently: TAKE 2 TABLETS AT BEDTIME FOR SLEEP) 90 tablet 0 09/24/2014 at Unknown time  . colchicine (COLCRYS) 0.6 MG tablet  Take 1 tablet by mouth every day for gout (Patient taking differently: Take 0.6 mg by mouth daily. for gout) 30 tablet 3 09/24/2014 at Unknown time  . diphenhydrAMINE (BENADRYL) 25 MG tablet Take 50 mg by mouth at bedtime. For itching   PRN  . DULoxetine (CYMBALTA) 60 MG capsule Take 1 capsule by mouth every day for anxiety (Patient taking differently: Take 60 mg by mouth daily. for anxiety) 90 capsule 1 09/24/2014 at Unknown time  . furosemide (LASIX) 20 MG tablet Take one tablet by mouth twice daily for edema (Patient taking differently: Take 20 mg by mouth 2 (two) times daily. for edema) 180 tablet 1 09/24/2014 at Unknown time  . guaiFENesin-dextromethorphan (ROBITUSSIN DM) 100-10 MG/5ML syrup Take 5 mLs by mouth every 4 (four) hours as needed for cough. 118 mL 0 PRN  . Insulin Glargine (TOUJEO  SOLOSTAR) 300 UNIT/ML SOPN Inject 100 Units into the skin daily. (Patient taking differently: Inject 110 Units into the skin at bedtime. Inject 55 units in each arm at bedtime (total 110 units)) 15 mL 6 09/23/2014 at Unknown time  . Insulin Lispro, Human, (HUMALOG KWIKPEN) 200 UNIT/ML SOPN Inject 35 Units into the skin 3 (three) times daily before meals. (Patient taking differently: Inject 35 Units into the skin See admin instructions. Inject 35 units at each meal (most days twice daily, occasionally 3 times daily)) 12 mL 6 09/24/2014 at Unknown time  . isosorbide mononitrate (IMDUR) 60 MG 24 hr tablet Take 1 tablet (60 mg total) by mouth daily. 90 tablet 1 09/24/2014 at Unknown time  . Linaclotide (LINZESS) 145 MCG CAPS capsule Take 1 capsule (145 mcg total) by mouth daily. Sample provided 30 capsule 3 09/24/2014 at Unknown time  . metoprolol succinate (TOPROL-XL) 25 MG 24 hr tablet Take 1 tablet by mouth once every day for blood pressure (Patient taking differently: Take 25 mg by mouth daily. for blood pressure) 90 tablet 1 09/24/2014 at 0800  . nitroGLYCERIN (NITROSTAT) 0.4 MG SL tablet Take one tablet under the tongue every 5 minutes as needed for chest pain (Patient taking differently: Place 0.4 mg under the tongue every 5 (five) minutes as needed for chest pain. ) 50 tablet 0 PRN  . oxyCODONE-acetaminophen (PERCOCET) 10-325 MG per tablet Take one tablet every 6 hours as needed for pain (Patient taking differently: Take 1 tablet by mouth every 6 (six) hours as needed for pain. ) 120 tablet 0 09/24/2014 at Unknown time  . pantoprazole (PROTONIX) 40 MG tablet TAKE ONE TABLET BY MOUTH ONCE DAILY 90 tablet 1 09/24/2014 at Unknown time  . pravastatin (PRAVACHOL) 20 MG tablet Take 1 tablet by mouth once every day for cholesterol (Patient taking differently: Take 20 mg by mouth daily. for cholesterol) 90 tablet 1 09/24/2014 at Unknown time  . temazepam (RESTORIL) 30 MG capsule One at bed for insomnia 30 capsule 2  09/23/2014 at Unknown time  . warfarin (COUMADIN) 5 MG tablet Take 2 tablets (10 mg total) by mouth daily. Please take 7 mg oral daily on Tues, Thursday, Sat, then 5 mg on Mon, Wed Fri, Sun. (Patient taking differently: Take 10 mg by mouth daily. Please take 7.5 mg oral daily on Tues, Thursday, Sat, then 5 mg on Mon, Wed Fri, Sun.) 90 tablet 3 09/24/2014 at Unknown time  . acetaminophen (TYLENOL) 325 MG tablet Take 2 tablets (650 mg total) by mouth every 6 (six) hours as needed for mild pain (or Fever >/= 101). (Patient not taking: Reported on 09/24/2014)  Not Taking at Unknown time  . albuterol (PROVENTIL HFA;VENTOLIN HFA) 108 (90 BASE) MCG/ACT inhaler Inhale 2 puffs into the lungs every 6 (six) hours as needed for wheezing or shortness of breath. (Patient not taking: Reported on 09/24/2014) 1 Inhaler 6 Not Taking at Unknown time   Scheduled:  . budesonide-formoterol  2 puff Inhalation BID  . clonazePAM  1 mg Oral Daily  . clonazePAM  2 mg Oral QHS  . colchicine  0.6 mg Oral Daily  . DULoxetine  60 mg Oral Daily  . furosemide  20 mg Oral BID  . insulin aspart  0-9 Units Subcutaneous TID WC  . Insulin Glargine  110 Units Subcutaneous QHS  . Insulin Lispro (Human)  35 Units Subcutaneous See admin instructions  . isosorbide mononitrate  60 mg Oral Daily  . Linaclotide  145 mcg Oral Daily  . metoprolol succinate  25 mg Oral Daily  . pantoprazole  40 mg Oral Daily  . pravastatin  20 mg Oral Daily  . sodium chloride  3 mL Intravenous Q12H  . temazepam  30 mg Oral QHS    Assessment: 80yo female c/o weakness, dizziness, and nausea, neuro exam unremarkable, to continue Coumadin for h/o PE during admission for further w/u of vertigo; last INR on file 5/2 at goal but current INR not drawn, last Coumadin dose taken 5/19 PTA.  Goal of Therapy:  INR 2-3   Plan:  Will obtain current INR this am prior to dosing Coumadin.  Wynona Neat, PharmD, BCPS  09/25/2014,12:44 AM

## 2014-09-25 NOTE — Progress Notes (Addendum)
Physical Therapy Vestibular Evaluation  Pt reports +allergies, sinus drainage x two weeks, difficulty watching television (things look double), and yesterday in car experienced severe motion sickness/imbalance vs vertigo.   See test results below. Pt with abnormal VOR testing bilaterally with reproduction of symptoms (pt also had symptoms with all mobility). Pt educated on visual compensation techniques (fixing eyes on a static target), however she was unable to maintain this during mobility (becomes anxious, moves quickly, eyes hopping surface to surface). Initiated VOR exercises. Spoke with RN and NT re: how to help pt decrease symptoms. Spoke with Dr. Dyann Kief re: plan and PT plans to see pt again 5/21 prior to discharge.     09/25/14 1141  Vestibular Assessment  General Observation Pt describes motion sensitivity with all movements and "drunk" "off balance feeling in my head" Denies spinning.   Symptom Behavior  Type of Dizziness "Funny feeling in head"  Frequency of Dizziness with all movemen  Duration of Dizziness as long as she moves and several minutes afterward  Aggravating Factors Activity in general;Sitting in moving car  Relieving Factors Head stationary;Closing eyes;Rest  Occulomotor Exam  Occulomotor Alignment Normal  Spontaneous Absent  Gaze-induced Absent  Smooth Pursuits Saccades  Saccades Poor trajectory;Slow  Vestibulo-Occular Reflex  VOR 1 Head Only (x 1 viewing) unable to keep eyes on target with both Lt and Rt head turns at very slow speed  VOR to Slow Head Movement Positive bilaterally  VOR Cancellation Normal  Auditory  Comments unable to detect "scratch test" either ear  Positional Testing  Dix-Hallpike Dix-Hallpike Right;Dix-Hallpike Left (completed using hospital bed functions/trendelenberg)  Horizontal Canal Testing Horizontal Canal Right;Horizontal Canal Left  Dix-Hallpike Right  Dix-Hallpike Right Duration 60-90 seconds  Dix-Hallpike Right Symptoms No  nystagmus  Dix-Hallpike Left  Dix-Hallpike Left Duration 60-90 seconds  Dix-Hallpike Left Symptoms No nystagmus  Horizontal Canal Right  Horizontal Canal Right Duration asymptomatic except Lt sided headache  Horizontal Canal Left  Horizontal Canal Left Duration asymptomatic  Cognition  Cognition Orientation Level Oriented x 4    09/25/2014 [name], PT Pager: [phone]

## 2014-09-25 NOTE — Progress Notes (Signed)
[facility] PROGRESS NOTE  [name] [id number] DOB: 1935/02/16 DOA: 09/24/2014 PCP: Gildardo Cranker, DO  Assessment/Plan: 1-dizziness: especially when moving and changing position -patient still symptomatic and found to have positive abnormalities for vestibular dysfunction on PT evaluation -will start TID meclizine -reassess functionality for safe discharge in am -continue coumadin for secondary prevention in patient with hx of Atrial fibrillation  2-uncontrolled diabetes: on insulin -will benefit of lantus 65 BID (for overlapping and better control); will follow CBG's and switch in am -continue novolog TID (home dose for meal coverage)  3-hx of PE and DVT: continue coumadin  4-CKD stage 3: Cr at baseline. Will monitor  5-Morbid obesity:  -Low calorie diet and increase exercise discussed with patient  6-chronic diastolic CHF: preserved EF by last echo -currently compensated -no SOB  7-hx of OSA: advise to be compliant with CPAP    Code Status: Full Family Communication: daughter at bedside Disposition Plan: home in am most likely; will assess needs for [facility] services; will need outpatient vestibular rehabilitation   Consultants:  Neurology   Procedures:  See below for x-ray reports   Antibiotics:  None   HPI/Subjective: Afebrile, no CP, no SOB. reports that when she move still has some dizziness   Objective: Filed Vitals:   09/25/14 1405  BP: 129/54  Pulse: 85  Temp: 98.1 F (36.7 C)  Resp: 19    Intake/Output Summary (Last 24 hours) at 09/25/14 1708 Last data filed at 09/25/14 1300  Gross per 24 hour  Intake    600 ml  Output    150 ml  Net    450 ml   There were no vitals filed for this visit.  Exam:   General:  Afebrile, feeling better; but still with some dizziness when moving   Cardiovascular: no rubs, no gallops, rate controlled  Respiratory: CTA, no use of accessory muscles   Abdomen: soft, NT, ND, positive  BS  Musculoskeletal: no cyanosis or clubbing   Data Reviewed: Basic Metabolic Panel:  Recent Labs Lab 09/24/14 1459 09/25/14 0045  NA 136 135  K 5.3* 5.3*  CL 101 99*  CO2 25 27  GLUCOSE 325* 283*  BUN 20 22*  CREATININE 1.28* 1.32*  CALCIUM 8.9 8.8*   Liver Function Tests:  Recent Labs Lab 09/24/14 1459  AST 22  ALT 21  ALKPHOS 84  BILITOT 0.4  PROT 8.4*  ALBUMIN 2.9*   CBC:  Recent Labs Lab 09/24/14 1459 09/25/14 0045  WBC 8.9 10.4  NEUTROABS 5.9  --   HGB 10.7* 9.7*  HCT 34.9* 30.9*  MCV 85.7 85.1  PLT 319 306   BNP (last 3 results)  Recent Labs  06/24/14 1821  BNP 62.6    ProBNP (last 3 results)  Recent Labs  10/14/13 1644  PROBNP 150.2    CBG:  Recent Labs Lab 09/25/14 0033 09/25/14 1006 09/25/14 1114 09/25/14 1640  GLUCAP 243* 235* 264* 246*    Studies: Ct Head Wo Contrast  09/24/2014   CLINICAL DATA:  Dizziness.  Symptoms for 2 days.  EXAM: CT HEAD WITHOUT CONTRAST  TECHNIQUE: Contiguous axial images were obtained from the base of the skull through the vertex without intravenous contrast.  COMPARISON:  MR head performed earlier today.  FINDINGS: No evidence for acute infarction, hemorrhage, mass lesion, hydrocephalus, or extra-axial fluid. Cerebral and cerebellar atrophy. Hypoattenuation of white matter consistent with small vessel disease. No signs of large vessel occlusion. Hyperostosis of the calvarium. Ossification of the tentorium. No  Acute sinus or mastoid disease. RIGHT concha bullosa. Negative orbits.  IMPRESSION: Chronic changes as described.  No acute intracranial findings.   Electronically Signed   By: Rolla Flatten M.D.   On: 09/24/2014 21:19   Mr Brain Wo Contrast  09/24/2014   CLINICAL DATA:  Two days ago patient developed weakness, dizziness, and nausea. Chest pain also reported. Symptoms of vertigo.  EXAM: MRI HEAD WITHOUT CONTRAST RIGHT  TECHNIQUE: Multiplanar, multiecho pulse sequences of the brain and surrounding  structures were obtained without intravenous contrast.  COMPARISON:  CT head 12/31/2011.  FINDINGS: The patient was unable to remain motionless for the exam. Small or subtle lesions could be overlooked.  No evidence for acute infarction, hemorrhage, mass lesion, hydrocephalus, or extra-axial fluid. There is artifactual restricted diffusion along the medial LEFT occipital lobe and superior vermis related to the tentorial ossification.  Generalized atrophy. Extensive small vessel disease. Flow voids are maintained. Extracranial soft tissues are unremarkable.  IMPRESSION: Motion degraded exam. No acute intracranial findings. Chronic changes as described.   Electronically Signed   By: Rolla Flatten M.D.   On: 09/24/2014 20:08    Scheduled Meds: . budesonide-formoterol  2 puff Inhalation BID  . clonazePAM  1 mg Oral Daily  . clonazePAM  2 mg Oral QHS  . colchicine  0.6 mg Oral Daily  . DULoxetine  60 mg Oral Daily  . furosemide  20 mg Oral BID  . insulin aspart  0-9 Units Subcutaneous TID WC  . insulin aspart  35 Units Subcutaneous TID WC  . insulin glargine  110 Units Subcutaneous QHS  . isosorbide mononitrate  60 mg Oral Daily  . Linaclotide  145 mcg Oral Daily  . meclizine  25 mg Oral TID  . metoprolol succinate  25 mg Oral Daily  . pantoprazole  40 mg Oral Daily  . pravastatin  20 mg Oral Daily  . sodium chloride  3 mL Intravenous Q12H  . temazepam  30 mg Oral QHS  . warfarin  5 mg Oral Q M,W,F,Su-1800  . [START ON 09/26/2014] warfarin  7.5 mg Oral Q T,Th,Sat-1800  . Warfarin - Pharmacist Dosing Inpatient   Does not apply q1800   Continuous Infusions:   Principal Problem:   Vertigo Active Problems:   Hyperlipidemia   OBESITY, MORBID   Essential hypertension   Pulmonary embolus   Chronic diastolic congestive heart failure   Diabetes mellitus type 2, uncontrolled    Time spent: 30 minutes   Barton Dubois  [facility] Pager [phone]. If 7PM-7AM, please contact  night-coverage at [email or web address], password [facility] 09/25/2014, 5:08 PM

## 2014-09-25 NOTE — Progress Notes (Signed)
Nutrition Consult/Brief Note  RD consulted for nutrition education regarding diabetes.   Lab Results  Component Value Date   HGBA1C 10.7* 09/03/2014    RD briefly spoke with patient.  Pt falling asleep during RD visit.  Provided "Carbohydrate Counting for People with Diabetes" handout from the Academy of Nutrition and Dietetics.   BMI =  57.3 kg/m2.  Pt meets criteria for Obesity Class III based on current BMI.  Current diet order is Heart Healthy/Carbohydrate Modified. Labs and medications reviewed. No further nutrition interventions warranted at this time.   If additional nutrition issues arise, please re-consult RD.  Arthur Holms, RD, LDN Pager #: [phone] After-Hours Pager #: [phone]

## 2014-09-25 NOTE — Progress Notes (Signed)
ANTICOAGULATION CONSULT NOTE - Consult  Pharmacy Consult for Coumadin Indication: pulmonary embolus  Allergies  Allergen Reactions  . Sulfonamide Derivatives Swelling    Mouth swelling  . Tramadol Nausea And Vomiting   Vital Signs: Temp: 98.1 F (36.7 C) (05/20 1405) Temp Source: Oral (05/20 1405) BP: 129/54 mmHg (05/20 1405) Pulse Rate: 85 (05/20 1405)  Labs:  Recent Labs  09/24/14 1459 09/25/14 0045 09/25/14 0322  HGB 10.7* 9.7*  --   HCT 34.9* 30.9*  --   PLT 319 306  --   LABPROT  --   --  16.4*  INR  --   --  1.31  CREATININE 1.28* 1.32*  --     Estimated Creatinine Clearance: 58.3 mL/min (by C-G formula based on Cr of 1.32).   Medical History: Past Medical History  Diagnosis Date  . Hyperlipidemia     takes Pravastatin daily  . Coronary artery disease     a. s/p IMI 2004 tx with BMS to RCA;  b. s/p Promus DES to RCA 2/12 (cath: LM 20-30%, pLAD 20-30%, mLAD 50%, RI 30%, pRCA 60%, mRCA 90% - tx with PCI);  c. myoview 1/07: EF 49%, inf scar, no isch  . Urinary incontinence     takes Linzess daily  . Insomnia   . Osteoarthritis   . Obstructive sleep apnea   . Polymyalgia rheumatica   . Arthritis   . Pulmonary emboli 9/13    felt to need lifelong anticoagulation  . Allergy     takes Mucinex daily as needed  . Gout, unspecified     takes Colchicine daily  . Dysuria   . Pain, chronic   . Osteoarthrosis, unspecified whether generalized or localized, lower leg   . Anemia, unspecified   . Coronary atherosclerosis of native coronary artery   . CHF (congestive heart failure)     takes Furosemide daily  . Depression     takes Cymbalta daily  . Anxiety     takes Clonazepam daily as needed  . Hypertension     takes Imdur and Metoprolol daily  . GERD (gastroesophageal reflux disease)     takes Protonix daily  . Diabetes mellitus     insulin daily  . Type II or unspecified type diabetes mellitus without mention of complication, uncontrolled   .  Myocardial infarction 2004  . History of blood clots about 87yrs ago    in legs-takes Coumadin   . Shortness of breath dyspnea     with exertion and has Albuterol inhaler prn  . Headache     occasionally  . Vertigo     hx of;was taking Meclizine if needed  . Numbness   . Joint pain   . Joint swelling   . Osteoarthritis     Medications:  Prescriptions prior to admission  Medication Sig Dispense Refill Last Dose  . albuterol (PROVENTIL HFA;VENTOLIN HFA) 108 (90 BASE) MCG/ACT inhaler Inhale 2 puffs into the lungs every 6 (six) hours as needed for wheezing or shortness of breath. 1 Inhaler 0 09/23/2014 at Unknown time  . budesonide-formoterol (SYMBICORT) 160-4.5 MCG/ACT inhaler In 2 puffs twice daily to help wheezing 1 Inhaler 3 09/24/2014 at Unknown time  . Calcium Carbonate Antacid (TUMS PO) Take 3 tablets by mouth daily as needed (acid reflux).   09/23/2014 at Unknown time  . Cinnamon 500 MG capsule Take 500 mg by mouth 2 (two) times daily.   09/24/2014 at Unknown time  . clonazePAM (KLONOPIN) 1 MG tablet TAKE  1 TABLET BY MOUTH EVERY MORNING AND 2 TABLETS AT BEDTIME FOR SLEEP (Patient taking differently: TAKE 2 TABLETS AT BEDTIME FOR SLEEP) 90 tablet 0 09/24/2014 at Unknown time  . colchicine (COLCRYS) 0.6 MG tablet Take 1 tablet by mouth every day for gout (Patient taking differently: Take 0.6 mg by mouth daily. for gout) 30 tablet 3 09/24/2014 at Unknown time  . diphenhydrAMINE (BENADRYL) 25 MG tablet Take 50 mg by mouth at bedtime. For itching   PRN  . DULoxetine (CYMBALTA) 60 MG capsule Take 1 capsule by mouth every day for anxiety (Patient taking differently: Take 60 mg by mouth daily. for anxiety) 90 capsule 1 09/24/2014 at Unknown time  . furosemide (LASIX) 20 MG tablet Take one tablet by mouth twice daily for edema (Patient taking differently: Take 20 mg by mouth 2 (two) times daily. for edema) 180 tablet 1 09/24/2014 at Unknown time  . guaiFENesin-dextromethorphan (ROBITUSSIN DM) 100-10  MG/5ML syrup Take 5 mLs by mouth every 4 (four) hours as needed for cough. 118 mL 0 PRN  . Insulin Glargine (TOUJEO SOLOSTAR) 300 UNIT/ML SOPN Inject 100 Units into the skin daily. (Patient taking differently: Inject 110 Units into the skin at bedtime. Inject 55 units in each arm at bedtime (total 110 units)) 15 mL 6 09/23/2014 at Unknown time  . Insulin Lispro, Human, (HUMALOG KWIKPEN) 200 UNIT/ML SOPN Inject 35 Units into the skin 3 (three) times daily before meals. (Patient taking differently: Inject 35 Units into the skin See admin instructions. Inject 35 units at each meal (most days twice daily, occasionally 3 times daily)) 12 mL 6 09/24/2014 at Unknown time  . isosorbide mononitrate (IMDUR) 60 MG 24 hr tablet Take 1 tablet (60 mg total) by mouth daily. 90 tablet 1 09/24/2014 at Unknown time  . Linaclotide (LINZESS) 145 MCG CAPS capsule Take 1 capsule (145 mcg total) by mouth daily. Sample provided 30 capsule 3 09/24/2014 at Unknown time  . metoprolol succinate (TOPROL-XL) 25 MG 24 hr tablet Take 1 tablet by mouth once every day for blood pressure (Patient taking differently: Take 25 mg by mouth daily. for blood pressure) 90 tablet 1 09/24/2014 at 0800  . nitroGLYCERIN (NITROSTAT) 0.4 MG SL tablet Take one tablet under the tongue every 5 minutes as needed for chest pain (Patient taking differently: Place 0.4 mg under the tongue every 5 (five) minutes as needed for chest pain. ) 50 tablet 0 PRN  . oxyCODONE-acetaminophen (PERCOCET) 10-325 MG per tablet Take one tablet every 6 hours as needed for pain (Patient taking differently: Take 1 tablet by mouth every 6 (six) hours as needed for pain. ) 120 tablet 0 09/24/2014 at Unknown time  . pantoprazole (PROTONIX) 40 MG tablet TAKE ONE TABLET BY MOUTH ONCE DAILY 90 tablet 1 09/24/2014 at Unknown time  . pravastatin (PRAVACHOL) 20 MG tablet Take 1 tablet by mouth once every day for cholesterol (Patient taking differently: Take 20 mg by mouth daily. for cholesterol)  90 tablet 1 09/24/2014 at Unknown time  . temazepam (RESTORIL) 30 MG capsule One at bed for insomnia 30 capsule 2 09/23/2014 at Unknown time  . warfarin (COUMADIN) 5 MG tablet Take 2 tablets (10 mg total) by mouth daily. Please take 7 mg oral daily on Tues, Thursday, Sat, then 5 mg on Mon, Wed Fri, Sun. (Patient taking differently: Take 10 mg by mouth daily. Please take 7.5 mg oral daily on Tues, Thursday, Sat, then 5 mg on Mon, Wed Fri, Sun.) 90 tablet 3 09/24/2014 at  Unknown time  . acetaminophen (TYLENOL) 325 MG tablet Take 2 tablets (650 mg total) by mouth every 6 (six) hours as needed for mild pain (or Fever >/= 101). (Patient not taking: Reported on 09/24/2014)   Not Taking at Unknown time  . albuterol (PROVENTIL HFA;VENTOLIN HFA) 108 (90 BASE) MCG/ACT inhaler Inhale 2 puffs into the lungs every 6 (six) hours as needed for wheezing or shortness of breath. (Patient not taking: Reported on 09/24/2014) 1 Inhaler 6 Not Taking at Unknown time   Scheduled:  . budesonide-formoterol  2 puff Inhalation BID  . clonazePAM  1 mg Oral Daily  . clonazePAM  2 mg Oral QHS  . colchicine  0.6 mg Oral Daily  . DULoxetine  60 mg Oral Daily  . furosemide  20 mg Oral BID  . insulin aspart  0-9 Units Subcutaneous TID WC  . insulin aspart  35 Units Subcutaneous TID WC  . insulin glargine  110 Units Subcutaneous QHS  . isosorbide mononitrate  60 mg Oral Daily  . Linaclotide  145 mcg Oral Daily  . meclizine  25 mg Oral TID  . metoprolol succinate  25 mg Oral Daily  . pantoprazole  40 mg Oral Daily  . pravastatin  20 mg Oral Daily  . sodium chloride  3 mL Intravenous Q12H  . temazepam  30 mg Oral QHS  . Warfarin - Pharmacist Dosing Inpatient   Does not apply q1800    Assessment: 80yo female c/o weakness, dizziness, and nausea, neuro exam unremarkable, to continue Coumadin for h/o PE during admission for further w/u of vertigo; last INR on file 5/2 at goal but current INR not drawn, last Coumadin dose taken 5/19  PTA. Patient had not taken coumadin 5/14-5/18 for breast bx. INR=1.315/20.   Goal of Therapy:  INR 2-3   Plan:  Restart Coumadin 5mg  daily MWFSun, 7.5mg  TTHSat F/u INR  Isac Sarna, BS Pharm D, BCPS Clinical Pharmacist 09/25/2014,2:54 PM

## 2014-09-25 NOTE — Evaluation (Signed)
Physical Therapy Evaluation Patient Details Name: [name] MRN: [id number] DOB: 07/04/1934 Today's Date: 09/25/2014   History of Present Illness  Adm 5/19 with severe nausea and vertigo. Pt reports +allergies, sinus drainage x two weeks, difficulty watching television (things look double), and yesterday in car experienced severe motion sickness/imbalance vs vertigo. PMHx- morbid obesity, DVT/PE, CAD, DM, OA    Clinical Impression  Pt admitted with above diagnosis. (See also separate Vestibular Evaluation note).Pt currently with functional limitations due to the deficits listed below (see PT Problem List). Pt with severe vestibular hypofunction (definitely Lt and possibly bilateral). Pt will benefit from skilled PT to increase their independence and safety with mobility to allow discharge to the venue listed below.       Follow Up Recommendations Home health PT;Supervision for mobility/OOB (vestibular rehab)    Equipment Recommendations  None recommended by PT    Recommendations for Other Services       Precautions / Restrictions Precautions Precautions: Fall      Mobility  Bed Mobility Overal bed mobility: Needs Assistance;+2 for physical assistance Bed Mobility: Rolling;Supine to Sit (entered bed prone/knee first) Rolling: Modified independent (Device/Increase time)   Supine to sit: Mod assist;+2 for physical assistance;HOB elevated     General bed mobility comments: HOB elevated and rail for supine to sit (incr assist needed due to air mattress with tall foam edges); pt entered bed knee first/prone with minguard assist  Transfers Overall transfer level: Needs assistance Equipment used: 2 person hand held assist Transfers: Sit to/from [employer] Transfers Sit to Stand: Min assist;+2 physical assistance;+2 safety/equipment Stand pivot transfers: Min assist;+2 physical assistance;+2 safety/equipment       General transfer comment: transfer bed to [facility] with  bil HHA and pt holding armrests or bedrails when accessible; forward flexed; educated on visual compensation (eyes fixed on target) however pt with poor ability to do this; assist with return to bed  Ambulation/Gait             General Gait Details: unable due to feeling of imbalance  Stairs            Wheelchair Mobility    Modified Rankin (Stroke Patients Only)       Balance Overall balance assessment: Needs assistance Sitting-balance support: Bilateral upper extremity supported;Feet supported Sitting balance-Leahy Scale: Poor Sitting balance - Comments: needs UE support due to sense of imbalance   Standing balance support: Bilateral upper extremity supported Standing balance-Leahy Scale: Poor                               Pertinent Vitals/Pain Pain Assessment: No/denies pain    Home Living Family/patient expects to be discharged to:: Private residence Living Arrangements: Children Available Help at Discharge: Family;Available 24 hours/day (several members in/out) Type of Home: House Home Access: Level entry     Home Layout: Two level;Able to live on main level with bedroom/bathroom Home Equipment: Shower seat;Walker - 2 wheels;Wheelchair - manual;Bedside commode      Prior Function Level of Independence: Needs assistance   Gait / Transfers Assistance Needed: walker for very short distance ambulation within house. Outside of house uses W/c.  ADL's / Homemaking Assistance Needed: Needs assist with bath/dress.        Hand Dominance   Dominant Hand: Right    Extremity/Trunk Assessment   Upper Extremity Assessment: Generalized weakness           Lower Extremity Assessment:  Overall [facility] for tasks assessed      Cervical / Trunk Assessment: Other exceptions  Communication   Communication: No difficulties  Cognition Arousal/Alertness: Awake/alert Behavior During Therapy: WFL for tasks assessed/performed Overall Cognitive Status:  Within Functional Limits for tasks assessed                      General Comments General comments (skin integrity, edema, etc.): see vestib eval in separate note    Exercises Other Exercises Other Exercises: provided x 1 vestibular exercises with full explanation, demonstration, and return demonstration      Assessment/Plan    PT Assessment Patient needs continued PT services  PT Diagnosis Other (comment) (vertigo)   PT Problem List Decreased activity tolerance;Decreased balance;Decreased mobility;Decreased knowledge of use of DME;Decreased knowledge of precautions;Obesity  PT Treatment Interventions DME instruction;Gait training;Functional mobility training;Therapeutic activities;Therapeutic exercise;Balance training;Neuromuscular re-education;Patient/family education;Other (comment) (vestibular rehab)   PT Goals (Current goals can be found in the Care Plan section) Acute Rehab PT Goals Patient Stated Goal: feel better and be able to move PT Goal Formulation: With patient Time For Goal Achievement: 09/28/14 Potential to Achieve Goals: Good    Frequency Min 4X/week   Barriers to discharge        Co-evaluation               End of Session   Activity Tolerance: Treatment limited secondary to medical complications (Comment) (nausea and dizziness) Patient left: in bed;with call bell/phone within reach;with nursing/sitter in room;with family/visitor present Nurse Communication: Mobility status;Other (comment) (need to give meclizine tid (called MD to get order changed))    Functional Assessment Tool Used: clinical judgement Functional Limitation: Mobility: Walking and moving around Mobility: Walking and Moving Around Current Status [id number]): At least 60 percent but less than 80 percent impaired, limited or restricted Mobility: Walking and Moving Around Goal Status [phone]): At least 1 percent but less than 20 percent impaired, limited or restricted    Time:  [id number] PT Time Calculation (min) (ACUTE ONLY): 93 min   Charges:   PT Evaluation $Initial PT Evaluation Tier I: 1 Procedure PT Treatments $Therapeutic Exercise: 8-22 mins $Therapeutic Activity: 23-37 mins $Neuromuscular Re-education: 8-22 mins $Self Care/Home Management: 8-22   PT G Codes:   PT G-Codes **NOT FOR INPATIENT CLASS** Functional Assessment Tool Used: clinical judgement Functional Limitation: Mobility: Walking and moving around Mobility: Walking and Moving Around Current Status [id number]): At least 60 percent but less than 80 percent impaired, limited or restricted Mobility: Walking and Moving Around Goal Status [phone]): At least 1 percent but less than 20 percent impaired, limited or restricted    [place] Yearby 09/25/2014, 11:39 AM Pager [phone]

## 2014-09-26 DIAGNOSIS — N189 Chronic kidney disease, unspecified: Secondary | ICD-10-CM

## 2014-09-26 DIAGNOSIS — I10 Essential (primary) hypertension: Secondary | ICD-10-CM | POA: Diagnosis not present

## 2014-09-26 DIAGNOSIS — R42 Dizziness and giddiness: Secondary | ICD-10-CM | POA: Diagnosis not present

## 2014-09-26 DIAGNOSIS — I5032 Chronic diastolic (congestive) heart failure: Secondary | ICD-10-CM | POA: Diagnosis not present

## 2014-09-26 DIAGNOSIS — E1122 Type 2 diabetes mellitus with diabetic chronic kidney disease: Secondary | ICD-10-CM

## 2014-09-26 DIAGNOSIS — E1165 Type 2 diabetes mellitus with hyperglycemia: Secondary | ICD-10-CM | POA: Diagnosis not present

## 2014-09-26 DIAGNOSIS — E785 Hyperlipidemia, unspecified: Secondary | ICD-10-CM

## 2014-09-26 LAB — GLUCOSE, CAPILLARY
Glucose-Capillary: 130 mg/dL — ABNORMAL HIGH (ref 65–99)
Glucose-Capillary: 97 mg/dL (ref 65–99)

## 2014-09-26 LAB — PROTIME-INR
INR: 1.29 (ref 0.00–1.49)
Prothrombin Time: 16.2 seconds — ABNORMAL HIGH (ref 11.6–15.2)

## 2014-09-26 MED ORDER — INSULIN GLARGINE 100 UNIT/ML ~~LOC~~ SOLN
60.0000 [IU] | Freq: Two times a day (BID) | SUBCUTANEOUS | Status: DC
  Administered 2014-09-26: 60 [IU] via SUBCUTANEOUS
  Filled 2014-09-26 (×2): qty 0.6

## 2014-09-26 MED ORDER — INSULIN GLARGINE 300 UNIT/ML ~~LOC~~ SOPN: 60.0000 [IU] | PEN_INJECTOR | Freq: Two times a day (BID) | SUBCUTANEOUS | Status: DC

## 2014-09-26 MED ORDER — MECLIZINE HCL 25 MG PO TABS: 25.0000 mg | ORAL_TABLET | Freq: Three times a day (TID) | ORAL | Status: DC

## 2014-09-26 MED ORDER — CLONAZEPAM 1 MG PO TABS: ORAL_TABLET | ORAL | Status: DC

## 2014-09-26 NOTE — Discharge Summary (Signed)
Physician Discharge Summary  [name] [id number] DOB: [birth year] DOA: 09/24/2014  PCP: Gildardo Cranker, DO  Admit date: 09/24/2014 Discharge date: 09/26/2014  Time spent: 30 minutes  Recommendations for Outpatient Follow-up:  1. Repeat BMET to follow renal function 2. Follow CBG and A1C; adjust hypoglycemic regimen further base on her needs.  Discharge Diagnoses:  Principal Problem:   Vertigo Active Problems:   Hyperlipidemia   OBESITY, MORBID   Essential hypertension   Pulmonary embolus   Chronic diastolic congestive heart failure   Diabetes mellitus type 2, uncontrolled   Discharge Condition: stable and improved. Discharge home with home health service for vestibular training by PT. Follow up in 2 weeks with PCP  Diet recommendation: low carbohydrates; low calorie and low sodium diet  Filed Weights   09/26/14 0517  Weight: 162.751 kg (358 lb 12.8 oz)    History of present illness:  80 y.o. female with history of PE/DVT on Coumadin, CAD status post stenting, diabetes mellitus type 2, morbid obesity; who presented to the ER because of worsening dizziness over the last 2 days. Patient was originally planned to have a biopsy of the breast area for a mass for which Coumadin has been held for last 2 days prior to admission; but, was found to have elevated blood sugar and was referred to patient's PCP office. Following that, her face started developing dizziness on minimal exertion and all movements. Patient has developed some left ear pain and headache at the same time. Denies any nausea or  vomiting. In the ER patient had MRI brain which was negative for anything acute and on-call neurologist Dr. Wallie Char was consulted and Dr. Nicole Kindred has recommended admission for observation and evaluation/treatment for vertigo On exam patient is nonfocal.   Hospital Course:  1-dizziness: present especially when moving and changing position -patient much better and jsut minimally  symptomatic after meclizine given  -have positive abnormalities for vestibular dysfunction on PT evaluation -will discharge onTID meclizine -reassess functionality for safe discharge in am -continue coumadin for secondary prevention in patient with hx of PE/DVT  2-uncontrolled diabetes: on insulin -will benefit of lantus 60 BID (for overlapping and better control); will need close follow up of her CBG's and further adjustments to insulin by PCP depending on sugar fluctuation.  -Continue novolog TID (home dose for meal coverage) -advise to follow low carbohydrates diet -last A1C on 4/16 9.6  3-hx of PE and DVT: continue coumadin  4-CKD stage 3: Cr at baseline.   5-Morbid obesity:  -Low calorie diet and increase exercise discussed with patient -Body mass index is 56.18 kg/(m^2).   6-chronic diastolic CHF: preserved EF by last echo -currently compensated -no SOB -advise to follow low sodium diet and to check her weight on daily basis  7-hx of OSA: advise to be compliant with CPAP  Procedures:  See below for x-ray reports   Consultations:  Neurology   Discharge Exam: Filed Vitals:   09/26/14 0517  BP: 109/94  Pulse: 91  Temp: 98.1 F (36.7 C)  Resp: 20    General: Afebrile, feeling better; denies CP, SOB and endorses less dizziness when changing position   Cardiovascular: no rubs, no gallops, rate controlled  Respiratory: CTA, no use of accessory muscles   Abdomen: soft, NT, ND, positive BS  Musculoskeletal: no cyanosis or clubbing   Discharge Instructions   Discharge Instructions    Diet - low sodium heart healthy    Complete by:  As directed  Discharge instructions    Complete by:  As directed   Take medications as prescribed Follow instructions form HHPT regarding vestibular training Please follow a low carbohydrates diet Watch calorie intake and increase physical activity          Current Discharge Medication List    START taking these  medications   Details  meclizine (ANTIVERT) 25 MG tablet Take 1 tablet (25 mg total) by mouth 3 (three) times daily. Qty: 90 tablet, Refills: 0      CONTINUE these medications which have CHANGED   Details  clonazePAM (KLONOPIN) 1 MG tablet TAKE 2 TABLETS AT BEDTIME FOR SLEEP    Insulin Glargine (TOUJEO SOLOSTAR) 300 UNIT/ML SOPN Inject 60 Units into the skin 2 (two) times daily.   Associated Diagnoses: Uncontrolled diabetes mellitus with chronic kidney disease      CONTINUE these medications which have NOT CHANGED   Details  albuterol (PROVENTIL HFA;VENTOLIN HFA) 108 (90 BASE) MCG/ACT inhaler Inhale 2 puffs into the lungs every 6 (six) hours as needed for wheezing or shortness of breath. Qty: 1 Inhaler, Refills: 0    budesonide-formoterol (SYMBICORT) 160-4.5 MCG/ACT inhaler In 2 puffs twice daily to help wheezing Qty: 1 Inhaler, Refills: 3   Associated Diagnoses: Simple chronic bronchitis    Calcium Carbonate Antacid (TUMS PO) Take 3 tablets by mouth daily as needed (acid reflux).    Cinnamon 500 MG capsule Take 500 mg by mouth 2 (two) times daily.    colchicine (COLCRYS) 0.6 MG tablet Take 1 tablet by mouth every day for gout Qty: 30 tablet, Refills: 3   Associated Diagnoses: Gout    diphenhydrAMINE (BENADRYL) 25 MG tablet Take 50 mg by mouth at bedtime. For itching    DULoxetine (CYMBALTA) 60 MG capsule Take 1 capsule by mouth every day for anxiety Qty: 90 capsule, Refills: 1    furosemide (LASIX) 20 MG tablet Take one tablet by mouth twice daily for edema Qty: 180 tablet, Refills: 1    guaiFENesin-dextromethorphan (ROBITUSSIN DM) 100-10 MG/5ML syrup Take 5 mLs by mouth every 4 (four) hours as needed for cough. Qty: 118 mL, Refills: 0    Insulin Lispro, Human, (HUMALOG KWIKPEN) 200 UNIT/ML SOPN Inject 35 Units into the skin 3 (three) times daily before meals. Qty: 12 mL, Refills: 6   Associated Diagnoses: Uncontrolled diabetes mellitus with chronic kidney disease     isosorbide mononitrate (IMDUR) 60 MG 24 hr tablet Take 1 tablet (60 mg total) by mouth daily. Qty: 90 tablet, Refills: 1    Linaclotide (LINZESS) 145 MCG CAPS capsule Take 1 capsule (145 mcg total) by mouth daily. Sample provided Qty: 30 capsule, Refills: 3    metoprolol succinate (TOPROL-XL) 25 MG 24 hr tablet Take 1 tablet by mouth once every day for blood pressure Qty: 90 tablet, Refills: 1    nitroGLYCERIN (NITROSTAT) 0.4 MG SL tablet Take one tablet under the tongue every 5 minutes as needed for chest pain Qty: 50 tablet, Refills: 0    oxyCODONE-acetaminophen (PERCOCET) 10-325 MG per tablet Take one tablet every 6 hours as needed for pain Qty: 120 tablet, Refills: 0   Associated Diagnoses: Chronic pain syndrome    pantoprazole (PROTONIX) 40 MG tablet TAKE ONE TABLET BY MOUTH ONCE DAILY Qty: 90 tablet, Refills: 1    pravastatin (PRAVACHOL) 20 MG tablet Take 1 tablet by mouth once every day for cholesterol Qty: 90 tablet, Refills: 1    temazepam (RESTORIL) 30 MG capsule One at bed for insomnia Qty: 30  capsule, Refills: 2   Associated Diagnoses: Insomnia    warfarin (COUMADIN) 5 MG tablet Take 2 tablets (10 mg total) by mouth daily. Please take 7 mg oral daily on Tues, Thursday, Sat, then 5 mg on Mon, Wed Fri, Sun. Qty: 90 tablet, Refills: 3    acetaminophen (TYLENOL) 325 MG tablet Take 2 tablets (650 mg total) by mouth every 6 (six) hours as needed for mild pain (or Fever >/= 101).       Allergies  Allergen Reactions  . Sulfonamide Derivatives Swelling    Mouth swelling  . Tramadol Nausea And Vomiting   Follow-up Information    Follow up with Gildardo Cranker, DO. Schedule an appointment as soon as possible for a visit in 2 weeks.   Specialty:  Internal Medicine   Contact information:   [place] [id number] [phone]       The results of significant diagnostics from this hospitalization (including imaging, microbiology, ancillary and  laboratory) are listed below for reference.    Significant Diagnostic Studies: Ct Head Wo Contrast  09/24/2014   CLINICAL DATA:  Dizziness.  Symptoms for 2 days.  EXAM: CT HEAD WITHOUT CONTRAST  TECHNIQUE: Contiguous axial images were obtained from the base of the skull through the vertex without intravenous contrast.  COMPARISON:  MR head performed earlier today.  FINDINGS: No evidence for acute infarction, hemorrhage, mass lesion, hydrocephalus, or extra-axial fluid. Cerebral and cerebellar atrophy. Hypoattenuation of white matter consistent with small vessel disease. No signs of large vessel occlusion. Hyperostosis of the calvarium. Ossification of the tentorium. No Acute sinus or mastoid disease. RIGHT concha bullosa. Negative orbits.  IMPRESSION: Chronic changes as described.  No acute intracranial findings.   Electronically Signed   By: Rolla Flatten M.D.   On: 09/24/2014 21:19   Mr Brain Wo Contrast  09/24/2014   CLINICAL DATA:  Two days ago patient developed weakness, dizziness, and nausea. Chest pain also reported. Symptoms of vertigo.  EXAM: MRI HEAD WITHOUT CONTRAST RIGHT  TECHNIQUE: Multiplanar, multiecho pulse sequences of the brain and surrounding structures were obtained without intravenous contrast.  COMPARISON:  CT head 12/31/2011.  FINDINGS: The patient was unable to remain motionless for the exam. Small or subtle lesions could be overlooked.  No evidence for acute infarction, hemorrhage, mass lesion, hydrocephalus, or extra-axial fluid. There is artifactual restricted diffusion along the medial LEFT occipital lobe and superior vermis related to the tentorial ossification.  Generalized atrophy. Extensive small vessel disease. Flow voids are maintained. Extracranial soft tissues are unremarkable.  IMPRESSION: Motion degraded exam. No acute intracranial findings. Chronic changes as described.   Electronically Signed   By: Rolla Flatten M.D.   On: 09/24/2014 20:08   Labs: Basic Metabolic  Panel:  Recent Labs Lab 09/24/14 1459 09/25/14 0045  NA 136 135  K 5.3* 5.3*  CL 101 99*  CO2 25 27  GLUCOSE 325* 283*  BUN 20 22*  CREATININE 1.28* 1.32*  CALCIUM 8.9 8.8*   Liver Function Tests:  Recent Labs Lab 09/24/14 1459  AST 22  ALT 21  ALKPHOS 84  BILITOT 0.4  PROT 8.4*  ALBUMIN 2.9*   CBC:  Recent Labs Lab 09/24/14 1459 09/25/14 0045  WBC 8.9 10.4  NEUTROABS 5.9  --   HGB 10.7* 9.7*  HCT 34.9* 30.9*  MCV 85.7 85.1  PLT 319 306   BNP (last 3 results)  Recent Labs  06/24/14 1821  BNP 62.6    ProBNP (last 3 results)  Recent Labs  10/14/13 1644  PROBNP 150.2    CBG:  Recent Labs Lab 09/25/14 1006 09/25/14 1114 09/25/14 1640 09/25/14 2057 09/26/14 0624  GLUCAP 235* 264* 246* 236* 130*    Signed:  Barton Dubois  [facility] 09/26/2014, 10:56 AM

## 2014-09-26 NOTE — Progress Notes (Signed)
Discharge instructions reviewed with patient and family members, a copy placed on the chart, and the patient understood verbally that Teach Back was effective.  IV removed and no further questions or concerns from family were stated.  Patient wheeled by Charge Nurse to front of hospital for complete discharge.

## 2014-09-26 NOTE — Progress Notes (Signed)
Physical Therapy Treatment Patient Details Name: [name] MRN: [id number] DOB: 1935-05-07 Today's Date: 09/26/2014    History of Present Illness Adm 5/19 with severe nausea and vertigo. Pt reports +allergies, sinus drainage x two weeks, difficulty watching television (things look double), and yesterday in car experienced severe motion sickness/imbalance vs vertigo. PMHx- morbid obesity, DVT/PE, CAD, DM, OA    PT Comments    Pt had 4 doses of meclizine since last PT session and dizziness now controlled. Able to mobilize at her baseline (daughter present and confirmed). Reviewed all vestibular rehab education and exercises with pt aware HHPT being arranged.    Follow Up Recommendations  Home health PT;Supervision for mobility/OOB (vestibular rehab)     Equipment Recommendations  None recommended by PT    Recommendations for Other Services       Precautions / Restrictions Precautions Precautions: Fall    Mobility  Bed Mobility Overal bed mobility: Needs Assistance;+2 for physical assistance Bed Mobility: Rolling;Supine to Sit (entered bed prone/knee first) Rolling: Modified independent (Device/Increase time)   Supine to sit: Mod assist     General bed mobility comments: rail and HHA to pull up to sit (pt pulling on PT's arm)  Transfers Overall transfer level: Needs assistance Equipment used: None Transfers: Sit to/from [facility] Sit to Stand: Supervision Stand pivot transfers: Supervision       General transfer comment: transfer bed to [facility] and back to bed with pt holding armrests or bedrails; forward flexed; sit to stand to RW no assist and proper technique; did not need visual targets to control dizziness  Ambulation/Gait Ambulation/Gait assistance: Min guard Ambulation Distance (Feet): 20 Feet Assistive device: Rolling walker (2 wheeled) Gait Pattern/deviations: Step-through pattern;Decreased stride length;Trunk flexed;Wide base of  support Gait velocity: significantly decr Gait velocity interpretation: Below normal speed for age/gender General Gait Details: reports walking as she does at home (very flexed at hips) 2 standing rest breaks (props on her forearms on RW)   Stairs            Wheelchair Mobility    Modified Rankin (Stroke Patients Only)       Balance     Sitting balance-Leahy Scale: Fair     Standing balance support: Single extremity supported Standing balance-Leahy Scale: Poor (due to need for UE support; no outside support needed)                      Cognition Arousal/Alertness: Awake/alert Behavior During Therapy: WFL for tasks assessed/performed Overall Cognitive Status: Within Functional Limits for tasks assessed                      Exercises Other Exercises Other Exercises: reviewed use of visual targets and pt able to verbalize technique prior to OOB (ultimately did not need to use as meclizine has decr dizziness) Other Exercises: re_educated on x1 exercises (she could not demonstrate or verbalize what she learned 5/20); pt able to keep eyes on target with VERY slow head turns of <30 degrees Lt and Rt; repeated x3 up to 30 seconds with min-mod dizziness    General Comments        Pertinent Vitals/Pain Pain Assessment: No/denies pain    Home Living                      Prior Function            PT Goals (current goals can now be found  in the care plan section) Acute Rehab PT Goals Patient Stated Goal: feel better and be able to move PT Goal Formulation: With patient Time For Goal Achievement: 09/28/14 Potential to Achieve Goals: Good Progress towards PT goals: Progressing toward goals    Frequency  Min 4X/week    PT Plan Current plan remains appropriate    Co-evaluation             End of Session   Activity Tolerance: Patient tolerated treatment well Patient left: in bed;with call bell/phone within reach;with family/visitor  present (sitting EOB)     Time: [id number] PT Time Calculation (min) (ACUTE ONLY): 30 min  Charges:  $Therapeutic Exercise: 8-22 mins $Therapeutic Activity: 8-22 mins                    G Codes:      Jovi Alvizo Oct 10, 2014, 11:28 AM Pager [phone]

## 2014-09-27 DIAGNOSIS — R9439 Abnormal result of other cardiovascular function study: Secondary | ICD-10-CM

## 2014-09-27 HISTORY — DX: Abnormal result of other cardiovascular function study: R94.39

## 2014-09-27 NOTE — Care Management Note (Signed)
Case Management Note  Patient Details  Name: [name] MRN: [id number] Date of Birth: 1935-05-07  Subjective/Objective:                   Vertigo Action/Plan: dishcarge planning  Expected Discharge Date:                 Expected Discharge Plan:  [place]  In-House Referral:     Discharge planning Services  CM Consult  Post Acute Care Choice:    Choice offered to:  Patient  DME Arranged:    DME Agency:     HH Arranged:  RN, PT [place] Agency:  [facility]  Status of Service:  Completed, signed off  Medicare Important Message Given:    Date Medicare IM Given:    Medicare IM give by:    Date Additional Medicare IM Given:    Additional Medicare Important Message give by:     If discussed at [place] of Stay Meetings, dates discussed:    Additional Comments: CM spoke with pt to offer choice of home health agency.  Pt chooses [facility] to render HHPT/RN.  Referral called to [facility] rep, Tiffany.  No other CM needs were communicated.   Dellie Catholic, RN 09/27/2014, 4:28 PM

## 2014-09-28 DIAGNOSIS — I5032 Chronic diastolic (congestive) heart failure: Secondary | ICD-10-CM | POA: Diagnosis not present

## 2014-09-28 DIAGNOSIS — I129 Hypertensive chronic kidney disease with stage 1 through stage 4 chronic kidney disease, or unspecified chronic kidney disease: Secondary | ICD-10-CM | POA: Diagnosis not present

## 2014-09-28 DIAGNOSIS — E1165 Type 2 diabetes mellitus with hyperglycemia: Secondary | ICD-10-CM | POA: Diagnosis not present

## 2014-09-28 DIAGNOSIS — N183 Chronic kidney disease, stage 3 (moderate): Secondary | ICD-10-CM | POA: Diagnosis not present

## 2014-09-30 ENCOUNTER — Ambulatory Visit (INDEPENDENT_AMBULATORY_CARE_PROVIDER_SITE_OTHER): Payer: Medicare PPO | Admitting: Internal Medicine

## 2014-10-01 ENCOUNTER — Other Ambulatory Visit: Payer: Self-pay | Admitting: *Deleted

## 2014-10-01 MED ORDER — ONETOUCH ULTRASOFT LANCETS MISC: Status: DC

## 2014-10-01 NOTE — Telephone Encounter (Signed)
Patient requested and faxed to pharmacy 

## 2014-10-07 ENCOUNTER — Ambulatory Visit (INDEPENDENT_AMBULATORY_CARE_PROVIDER_SITE_OTHER): Payer: Medicare PPO | Admitting: Internal Medicine

## 2014-10-07 ENCOUNTER — Encounter: Payer: Self-pay | Admitting: Internal Medicine

## 2014-10-07 VITALS — BP 138/80 | HR 91 | Temp 98.0°F | Ht 67.0 in | Wt 370.0 lb

## 2014-10-07 DIAGNOSIS — I10 Essential (primary) hypertension: Secondary | ICD-10-CM

## 2014-10-07 DIAGNOSIS — E1165 Type 2 diabetes mellitus with hyperglycemia: Secondary | ICD-10-CM

## 2014-10-07 DIAGNOSIS — E785 Hyperlipidemia, unspecified: Secondary | ICD-10-CM | POA: Diagnosis not present

## 2014-10-07 DIAGNOSIS — Z7901 Long term (current) use of anticoagulants: Secondary | ICD-10-CM

## 2014-10-07 DIAGNOSIS — N189 Chronic kidney disease, unspecified: Secondary | ICD-10-CM | POA: Diagnosis not present

## 2014-10-07 DIAGNOSIS — J449 Chronic obstructive pulmonary disease, unspecified: Secondary | ICD-10-CM

## 2014-10-07 DIAGNOSIS — IMO0002 Reserved for concepts with insufficient information to code with codable children: Secondary | ICD-10-CM

## 2014-10-07 DIAGNOSIS — I5032 Chronic diastolic (congestive) heart failure: Secondary | ICD-10-CM

## 2014-10-07 DIAGNOSIS — E1129 Type 2 diabetes mellitus with other diabetic kidney complication: Secondary | ICD-10-CM

## 2014-10-07 DIAGNOSIS — E1122 Type 2 diabetes mellitus with diabetic chronic kidney disease: Secondary | ICD-10-CM | POA: Diagnosis not present

## 2014-10-07 MED ORDER — INSULIN GLARGINE 300 UNIT/ML ~~LOC~~ SOPN: 45.0000 [IU] | PEN_INJECTOR | Freq: Two times a day (BID) | SUBCUTANEOUS | Status: DC

## 2014-10-07 NOTE — Patient Instructions (Signed)
Inject 45 units of Toujeo in the morning and inject 45 units in the evening.  Every 3 days add up last 3 morning's fasting blood sugars. Divide this number by 3. If this number is more than 150, increase amount of Toujeo by 5 units.  Then wait 3 days and repeat.

## 2014-10-07 NOTE — Progress Notes (Signed)
Patient ID: [name], female   DOB: Jun 13, 1934, 80 y.o.   MRN: [id number]    Facility  [name]    Place of Service:   OFFICE    Allergies  Allergen Reactions  . Sulfonamide Derivatives Swelling    Mouth swelling  . Tramadol Nausea And Vomiting    Chief Complaint  Patient presents with  . Follow-up    1 week follow-up, blood sugar still running high (200 fasting this am)    HPI:  DM (diabetes mellitus), type 2, uncontrolled, with renal complications: Last clinic visit 09/23/14, patient and family were educated to begin with 4 of Toujeo BID. Following this visit were injecting total of 110 units once daily, in evening. Following hospitalization 5/19 - 5/21 was instructed to use 60 units BID, has been injecting 60 units of Toujeo at night only. Daughter misunderstood instructions. Avg of last 3 am BSGs was 193. Continues to complain of dry mouth. No hypoglycemic events.  OBESITY, MORBID: Is attempting to improve dietary choices. Unable to tolerate exercise d/t DOE  Chronic diastolic congestive heart failure: compensated.  Chronic obstructive pulmonary disease, unspecified COPD, unspecified chronic bronchitis type: Stable  Essential hypertension: controlled  Hyperlipidemia: on Pravastatin  Breast mass: Will be scheduled for surgery following improvement in glucose control.  Medications: Patient's Medications  New Prescriptions   INSULIN GLARGINE (TOUJEO SOLOSTAR) 300 UNIT/ML SOPN    Inject 45 Units into the skin 2 (two) times daily before a meal. Titrate up as needed.  Previous Medications   ACETAMINOPHEN (TYLENOL) 325 MG TABLET    Take 2 tablets (650 mg total) by mouth every 6 (six) hours as needed for mild pain (or Fever >/= 101).   ALBUTEROL (PROVENTIL HFA;VENTOLIN HFA) 108 (90 BASE) MCG/ACT INHALER    Inhale 2 puffs into the lungs every 6 (six) hours as needed for wheezing or shortness of breath.   BUDESONIDE-FORMOTEROL (SYMBICORT) 160-4.5 MCG/ACT INHALER    In 2 puffs  twice daily to help wheezing   CALCIUM CARBONATE ANTACID (TUMS PO)    Take 3 tablets by mouth daily as needed (acid reflux).   CINNAMON 500 MG CAPSULE    Take 500 mg by mouth 2 (two) times daily.   CLONAZEPAM (KLONOPIN) 1 MG TABLET    TAKE 2 TABLETS AT BEDTIME FOR SLEEP   COLCHICINE (COLCRYS) 0.6 MG TABLET    Take 1 tablet by mouth every day for gout   DIPHENHYDRAMINE (BENADRYL) 25 MG TABLET    Take 50 mg by mouth at bedtime. For itching   DULOXETINE (CYMBALTA) 60 MG CAPSULE    Take 1 capsule by mouth every day for anxiety   FUROSEMIDE (LASIX) 20 MG TABLET    Take one tablet by mouth twice daily for edema   GUAIFENESIN-DEXTROMETHORPHAN (ROBITUSSIN DM) 100-10 MG/5ML SYRUP    Take 5 mLs by mouth every 4 (four) hours as needed for cough.   INSULIN LISPRO, HUMAN, (HUMALOG KWIKPEN) 200 UNIT/ML SOPN    Inject 35 Units into the skin 3 (three) times daily before meals.   ISOSORBIDE MONONITRATE (IMDUR) 60 MG 24 HR TABLET    Take 1 tablet (60 mg total) by mouth daily.   LANCETS (ONETOUCH ULTRASOFT) LANCETS    Use to test blood sugar Dx: E11.9   LINACLOTIDE (LINZESS) 145 MCG CAPS CAPSULE    Take 1 capsule (145 mcg total) by mouth daily. Sample provided   MECLIZINE (ANTIVERT) 25 MG TABLET    Take 1 tablet (25 mg total) by mouth  3 (three) times daily.   METOPROLOL SUCCINATE (TOPROL-XL) 25 MG 24 HR TABLET    Take 1 tablet by mouth once every day for blood pressure   NITROGLYCERIN (NITROSTAT) 0.4 MG SL TABLET    Take one tablet under the tongue every 5 minutes as needed for chest pain   OXYCODONE-ACETAMINOPHEN (PERCOCET) 10-325 MG PER TABLET    Take one tablet every 6 hours as needed for pain   PANTOPRAZOLE (PROTONIX) 40 MG TABLET    TAKE ONE TABLET BY MOUTH ONCE DAILY   PRAVASTATIN (PRAVACHOL) 20 MG TABLET    Take 1 tablet by mouth once every day for cholesterol   TEMAZEPAM (RESTORIL) 30 MG CAPSULE    One at bed for insomnia   WARFARIN (COUMADIN) 5 MG TABLET    Take 2 tablets (10 mg total) by mouth daily.  Please take 7 mg oral daily on Tues, Thursday, Sat, then 5 mg on Mon, Wed Fri, Sun.  Modified Medications   No medications on file  Discontinued Medications   INSULIN GLARGINE (TOUJEO SOLOSTAR) 300 UNIT/ML SOPN    Inject 60 Units into the skin 2 (two) times daily.     Review of Systems  Constitutional: Negative for fever, chills, diaphoresis, activity change, appetite change and fatigue.       Morbidly obese and dietary noncompliance.  HENT: Negative for ear pain and sore throat.   Eyes: Negative for visual disturbance.  Respiratory: Positive for chest tightness, shortness of breath and wheezing.        History of right breast mass  Cardiovascular: Positive for leg swelling. Negative for chest pain and palpitations.  Gastrointestinal: Negative for nausea, vomiting, abdominal pain, diarrhea, constipation and blood in stool.  Endocrine: Positive for polydipsia and polyuria.       Poorly controlled diabetic  Genitourinary: Negative for dysuria.       Wakes 5 times nightly to urinate  Musculoskeletal: Positive for back pain, joint swelling, arthralgias and gait problem.  Skin: Negative for rash.  Neurological: Negative for dizziness, tremors and numbness. Headaches: chronic, recurrent, tension-type.  Psychiatric/Behavioral: Positive for dysphoric mood. Negative for sleep disturbance. The patient is not nervous/anxious.     Filed Vitals:   10/07/14 1148  BP: 138/80  Pulse: 91  Temp: 98 F (36.7 C)  TempSrc: Oral  Height: 5' 7" (1.702 m)  Weight: 370 lb (167.831 kg)  SpO2: 98%   Body mass index is 57.94 kg/(m^2).  Physical Exam  Constitutional: She is oriented to person, place, and time. She appears well-developed and well-nourished.  Sitting in w/c. Morbid obesity.  HENT:  Mouth/Throat: Oropharynx is clear and moist. No oropharyngeal exudate.  Eyes: Pupils are equal, round, and reactive to light. No scleral icterus.  Neck: Neck supple. No tracheal deviation present. No  thyromegaly present.  Cardiovascular: Normal rate, regular rhythm and intact distal pulses.  Exam reveals no gallop and no friction rub.   Murmur (1/6 SEM) heard. +1 pitting LE edema b/l. no calf TTP. No carotid bruit b/l  Pulmonary/Chest: Effort normal. No stridor. No respiratory distress. She has decreased breath sounds. She has wheezes. She has no rales.  Abdominal: Soft. Bowel sounds are normal. She exhibits no distension and no mass. There is no tenderness. There is no rebound and no guarding.  Musculoskeletal: She exhibits edema and tenderness.  B/l bunions with hammertoes. Right 1st toenail absent. Monofilament testing intact b/l  Lymphadenopathy:    She has no cervical adenopathy.  Neurological: She is alert and oriented  to person, place, and time. She has normal reflexes.  Skin: Skin is warm and dry. No rash noted.  Psychiatric: Her behavior is normal. Judgment and thought content normal. She exhibits a depressed mood.     Labs reviewed: Admission on 09/24/2014, Discharged on 09/26/2014  Component Date Value Ref Range Status  . Sodium 09/24/2014 136  135 - 145 mmol/L Final  . Potassium 09/24/2014 5.3* 3.5 - 5.1 mmol/L Final  . Chloride 09/24/2014 101  101 - 111 mmol/L Final  . CO2 09/24/2014 25  22 - 32 mmol/L Final  . Glucose, Bld 09/24/2014 325* 65 - 99 mg/dL Final  . BUN 09/24/2014 20  6 - 20 mg/dL Final  . Creatinine, Ser 09/24/2014 1.28* 0.44 - 1.00 mg/dL Final  . Calcium 09/24/2014 8.9  8.9 - 10.3 mg/dL Final  . Total Protein 09/24/2014 8.4* 6.5 - 8.1 g/dL Final  . Albumin 09/24/2014 2.9* 3.5 - 5.0 g/dL Final  . AST 09/24/2014 22  15 - 41 U/L Final  . ALT 09/24/2014 21  14 - 54 U/L Final  . Alkaline Phosphatase 09/24/2014 84  38 - 126 U/L Final  . Total Bilirubin 09/24/2014 0.4  0.3 - 1.2 mg/dL Final  . GFR calc non Af Amer 09/24/2014 39* >60 mL/min Final  . GFR calc Af Amer 09/24/2014 46* >60 mL/min Final   Comment: (NOTE) The eGFR has been calculated using the CKD  EPI equation. This calculation has not been validated in all clinical situations. eGFR's persistently <60 mL/min signify possible Chronic Kidney Disease.   . Anion gap 09/24/2014 10  5 - 15 Final  . WBC 09/24/2014 8.9  4.0 - 10.5 K/uL Final  . RBC 09/24/2014 4.07  3.87 - 5.11 MIL/uL Final  . Hemoglobin 09/24/2014 10.7* 12.0 - 15.0 g/dL Final  . HCT 09/24/2014 34.9* 36.0 - 46.0 % Final  . MCV 09/24/2014 85.7  78.0 - 100.0 fL Final  . MCH 09/24/2014 26.3  26.0 - 34.0 pg Final  . MCHC 09/24/2014 30.7  30.0 - 36.0 g/dL Final  . RDW 09/24/2014 15.1  11.5 - 15.5 % Final  . Platelets 09/24/2014 319  150 - 400 K/uL Final  . Neutrophils Relative % 09/24/2014 66  43 - 77 % Final  . Neutro Abs 09/24/2014 5.9  1.7 - 7.7 K/uL Final  . Lymphocytes Relative 09/24/2014 24  12 - 46 % Final  . Lymphs Abs 09/24/2014 2.1  0.7 - 4.0 K/uL Final  . Monocytes Relative 09/24/2014 6  3 - 12 % Final  . Monocytes Absolute 09/24/2014 0.5  0.1 - 1.0 K/uL Final  . Eosinophils Relative 09/24/2014 4  0 - 5 % Final  . Eosinophils Absolute 09/24/2014 0.4  0.0 - 0.7 K/uL Final  . Basophils Relative 09/24/2014 0  0 - 1 % Final  . Basophils Absolute 09/24/2014 0.0  0.0 - 0.1 K/uL Final  . Troponin i, poc 09/24/2014 0.01  0.00 - 0.08 ng/mL Final  . Comment 3 09/24/2014          Final   Comment: Due to the release kinetics of cTnI, a negative result within the first hours of the onset of symptoms does not rule out myocardial infarction with certainty. If myocardial infarction is still suspected, repeat the test at appropriate intervals.   . Color, Urine 09/24/2014 YELLOW  YELLOW Final  . APPearance 09/24/2014 CLOUDY* CLEAR Final  . Specific Gravity, Urine 09/24/2014 1.016  1.005 - 1.030 Final  . pH 09/24/2014 5.0  5.0 - 8.0 Final  . Glucose, UA 09/24/2014 NEGATIVE  NEGATIVE mg/dL Final  . Hgb urine dipstick 09/24/2014 TRACE* NEGATIVE Final  . Bilirubin Urine 09/24/2014 NEGATIVE  NEGATIVE Final  . Ketones, ur  09/24/2014 NEGATIVE  NEGATIVE mg/dL Final  . Protein, ur 09/24/2014 NEGATIVE  NEGATIVE mg/dL Final  . Urobilinogen, UA 09/24/2014 0.2  0.0 - 1.0 mg/dL Final  . Nitrite 09/24/2014 NEGATIVE  NEGATIVE Final  . Leukocytes, UA 09/24/2014 MODERATE* NEGATIVE Final  . Squamous Epithelial / LPF 09/24/2014 FEW* RARE Final  . WBC, UA 09/24/2014 11-20  <3 WBC/hpf Final  . RBC / HPF 09/24/2014 0-2  <3 RBC/hpf Final  . Bacteria, UA 09/24/2014 MANY* RARE Final  . Sodium 09/25/2014 135  135 - 145 mmol/L Final  . Potassium 09/25/2014 5.3* 3.5 - 5.1 mmol/L Final  . Chloride 09/25/2014 99* 101 - 111 mmol/L Final  . CO2 09/25/2014 27  22 - 32 mmol/L Final  . Glucose, Bld 09/25/2014 283* 65 - 99 mg/dL Final  . BUN 09/25/2014 22* 6 - 20 mg/dL Final  . Creatinine, Ser 09/25/2014 1.32* 0.44 - 1.00 mg/dL Final  . Calcium 09/25/2014 8.8* 8.9 - 10.3 mg/dL Final  . GFR calc non Af Amer 09/25/2014 38* >60 mL/min Final  . GFR calc Af Amer 09/25/2014 44* >60 mL/min Final   Comment: (NOTE) The eGFR has been calculated using the CKD EPI equation. This calculation has not been validated in all clinical situations. eGFR's persistently <60 mL/min signify possible Chronic Kidney Disease.   . Anion gap 09/25/2014 9  5 - 15 Final  . WBC 09/25/2014 10.4  4.0 - 10.5 K/uL Final  . RBC 09/25/2014 3.63* 3.87 - 5.11 MIL/uL Final  . Hemoglobin 09/25/2014 9.7* 12.0 - 15.0 g/dL Final  . HCT 09/25/2014 30.9* 36.0 - 46.0 % Final  . MCV 09/25/2014 85.1  78.0 - 100.0 fL Final  . MCH 09/25/2014 26.7  26.0 - 34.0 pg Final  . MCHC 09/25/2014 31.4  30.0 - 36.0 g/dL Final  . RDW 09/25/2014 15.0  11.5 - 15.5 % Final  . Platelets 09/25/2014 306  150 - 400 K/uL Final  . Sed Rate 09/25/2014 110* 0 - 22 mm/hr Final  . Glucose-Capillary 09/25/2014 243* 65 - 99 mg/dL Final  . Prothrombin Time 09/25/2014 16.4* 11.6 - 15.2 seconds Final  . INR 09/25/2014 1.31  0.00 - 1.49 Final  . Glucose-Capillary 09/25/2014 235* 65 - 99 mg/dL Final  .  Glucose-Capillary 09/25/2014 264* 65 - 99 mg/dL Final  . Glucose-Capillary 09/25/2014 246* 65 - 99 mg/dL Final  . Comment 1 09/25/2014 Notify RN   Final  . Comment 2 09/25/2014 Document in Chart   Final  . Prothrombin Time 09/26/2014 16.2* 11.6 - 15.2 seconds Final  . INR 09/26/2014 1.29  0.00 - 1.49 Final  . Glucose-Capillary 09/25/2014 236* 65 - 99 mg/dL Final  . Comment 1 09/25/2014 Notify RN   Final  . Comment 2 09/25/2014 Document in Chart   Final  . Glucose-Capillary 09/26/2014 130* 65 - 99 mg/dL Final  . Comment 1 09/26/2014 Notify RN   Final  . Comment 2 09/26/2014 Document in Chart   Final  . Glucose-Capillary 09/26/2014 97  65 - 99 mg/dL Final  Hospital Outpatient Visit on 09/17/2014  Component Date Value Ref Range Status  . Sodium 09/17/2014 139  135 - 145 mmol/L Final  . Potassium 09/17/2014 4.7  3.5 - 5.1 mmol/L Final  . Chloride 09/17/2014 104  101 - 111  mmol/L Final  . CO2 09/17/2014 24  22 - 32 mmol/L Final  . Glucose, Bld 09/17/2014 216* 65 - 99 mg/dL Final  . BUN 09/17/2014 20  6 - 20 mg/dL Final  . Creatinine, Ser 09/17/2014 1.33* 0.44 - 1.00 mg/dL Final  . Calcium 09/17/2014 8.8* 8.9 - 10.3 mg/dL Final  . GFR calc non Af Amer 09/17/2014 37* >60 mL/min Final  . GFR calc Af Amer 09/17/2014 43* >60 mL/min Final   Comment: (NOTE) The eGFR has been calculated using the CKD EPI equation. This calculation has not been validated in all clinical situations. eGFR's persistently <60 mL/min signify possible Chronic Kidney Disease.   . Anion gap 09/17/2014 11  5 - 15 Final  . WBC 09/17/2014 7.9  4.0 - 10.5 K/uL Final  . RBC 09/17/2014 3.85* 3.87 - 5.11 MIL/uL Final  . Hemoglobin 09/17/2014 9.9* 12.0 - 15.0 g/dL Final  . HCT 09/17/2014 32.6* 36.0 - 46.0 % Final  . MCV 09/17/2014 84.7  78.0 - 100.0 fL Final  . MCH 09/17/2014 25.7* 26.0 - 34.0 pg Final  . MCHC 09/17/2014 30.4  30.0 - 36.0 g/dL Final  . RDW 09/17/2014 14.8  11.5 - 15.5 % Final  . Platelets 09/17/2014 341   150 - 400 K/uL Final  Anti-coag visit on 09/07/2014  Component Date Value Ref Range Status  . INR 09/07/2014 2.0   Final  Lab on 09/03/2014  Component Date Value Ref Range Status  . Hgb A1c MFr Bld 09/03/2014 10.7* 4.8 - 5.6 % Final   Comment:          Pre-diabetes: 5.7 - 6.4          Diabetes: >6.4          Glycemic control for adults with diabetes: <7.0   . Est. average glucose Bld gHb Est-m* 09/03/2014 260   Final  . Glucose 09/03/2014 258* 65 - 99 mg/dL Final  . BUN 09/03/2014 20  8 - 27 mg/dL Final  . Creatinine, Ser 09/03/2014 1.19* 0.57 - 1.00 mg/dL Final  . GFR calc non Af Amer 09/03/2014 44* >59 mL/min/1.73 Final  . GFR calc Af Amer 09/03/2014 51* >59 mL/min/1.73 Final  . BUN/Creatinine Ratio 09/03/2014 17  11 - 26 Final  . Sodium 09/03/2014 139  134 - 144 mmol/L Final  . Potassium 09/03/2014 4.6  3.5 - 5.2 mmol/L Final  . Chloride 09/03/2014 99  97 - 108 mmol/L Final  . CO2 09/03/2014 22  18 - 29 mmol/L Final  . Calcium 09/03/2014 8.7  8.7 - 10.3 mg/dL Final  . Total Protein 09/03/2014 7.2  6.0 - 8.5 g/dL Final  . Albumin 09/03/2014 3.5  3.5 - 4.8 g/dL Final  . Globulin, Total 09/03/2014 3.7  1.5 - 4.5 g/dL Final  . Albumin/Globulin Ratio 09/03/2014 0.9* 1.1 - 2.5 Final  . Bilirubin Total 09/03/2014 0.2  0.0 - 1.2 mg/dL Final  . Alkaline Phosphatase 09/03/2014 96  39 - 117 IU/L Final  . AST 09/03/2014 17  0 - 40 IU/L Final  . ALT 09/03/2014 16  0 - 32 IU/L Final  Anti-coag visit on 08/10/2014  Component Date Value Ref Range Status  . INR 08/10/2014 2.0   Final  Appointment on 08/07/2014  Component Date Value Ref Range Status  . Hgb A1c MFr Bld 08/07/2014 9.6* 4.8 - 5.6 % Final   Comment:          Pre-diabetes: 5.7 - 6.4  Diabetes: >6.4          Glycemic control for adults with diabetes: <7.0   . Est. average glucose Bld gHb Est-m* 08/07/2014 229   Final  . Glucose 08/07/2014 169* 65 - 99 mg/dL Final  . BUN 08/07/2014 21  8 - 27 mg/dL Final  . Creatinine,  Ser 08/07/2014 1.23* 0.57 - 1.00 mg/dL Final  . GFR calc non Af Amer 08/07/2014 42* >59 mL/min/1.73 Final  . GFR calc Af Amer 08/07/2014 49* >59 mL/min/1.73 Final  . BUN/Creatinine Ratio 08/07/2014 17  11 - 26 Final  . Sodium 08/07/2014 139  134 - 144 mmol/L Final  . Potassium 08/07/2014 4.6  3.5 - 5.2 mmol/L Final  . Chloride 08/07/2014 99  97 - 108 mmol/L Final  . CO2 08/07/2014 23  18 - 29 mmol/L Final  . Calcium 08/07/2014 8.5* 8.7 - 10.3 mg/dL Final  . Total Protein 08/07/2014 6.8  6.0 - 8.5 g/dL Final  . Albumin 08/07/2014 3.5  3.5 - 4.8 g/dL Final  . Globulin, Total 08/07/2014 3.3  1.5 - 4.5 g/dL Final  . Albumin/Globulin Ratio 08/07/2014 1.1  1.1 - 2.5 Final  . Bilirubin Total 08/07/2014 <0.2  0.0 - 1.2 mg/dL Final  . Alkaline Phosphatase 08/07/2014 84  39 - 117 IU/L Final  . AST 08/07/2014 19  0 - 40 IU/L Final  . ALT 08/07/2014 17  0 - 32 IU/L Final  Anti-coag visit on 07/13/2014  Component Date Value Ref Range Status  . INR 07/13/2014 2.1   Final  Office Visit on 07/08/2014  Component Date Value Ref Range Status  . WBC 07/08/2014 8.2  3.4 - 10.8 x10E3/uL Final  . RBC 07/08/2014 3.93  3.77 - 5.28 x10E6/uL Final  . Hemoglobin 07/08/2014 10.6* 11.1 - 15.9 g/dL Final  . HCT 07/08/2014 33.4* 34.0 - 46.6 % Final  . MCV 07/08/2014 85  79 - 97 fL Final  . MCH 07/08/2014 27.0  26.6 - 33.0 pg Final  . MCHC 07/08/2014 31.7  31.5 - 35.7 g/dL Final  . RDW 07/08/2014 15.6* 12.3 - 15.4 % Final  . Platelets 07/08/2014 312  150 - 379 x10E3/uL Final  . Neutrophils Relative % 07/08/2014 62   Final  . Lymphs 07/08/2014 28   Final  . Monocytes 07/08/2014 8   Final  . Eos 07/08/2014 2   Final  . Basos 07/08/2014 0   Final  . Neutrophils Absolute 07/08/2014 5.0  1.4 - 7.0 x10E3/uL Final  . Lymphocytes Absolute 07/08/2014 2.3  0.7 - 3.1 x10E3/uL Final  . Monocytes Absolute 07/08/2014 0.7  0.1 - 0.9 x10E3/uL Final  . Eosinophils Absolute 07/08/2014 0.2  0.0 - 0.4 x10E3/uL Final  . Basophils  Absolute 07/08/2014 0.0  0.0 - 0.2 x10E3/uL Final  . Immature Granulocytes 07/08/2014 0   Final  . Immature Grans (Abs) 07/08/2014 0.0  0.0 - 0.1 x10E3/uL Final  . Glucose 07/08/2014 188* 65 - 99 mg/dL Final  . BUN 07/08/2014 22  8 - 27 mg/dL Final  . Creatinine, Ser 07/08/2014 1.35* 0.57 - 1.00 mg/dL Final  . GFR calc non Af Amer 07/08/2014 38* >59 mL/min/1.73 Final  . GFR calc Af Amer 07/08/2014 44* >59 mL/min/1.73 Final  . BUN/Creatinine Ratio 07/08/2014 16  11 - 26 Final  . Sodium 07/08/2014 139  134 - 144 mmol/L Final  . Potassium 07/08/2014 4.9  3.5 - 5.2 mmol/L Final  . Chloride 07/08/2014 98  97 - 108 mmol/L Final  . CO2  07/08/2014 26  18 - 29 mmol/L Final  . Calcium 07/08/2014 8.8  8.7 - 10.3 mg/dL Final  . Prolactin 07/08/2014 14.4  4.8 - 23.3 ng/mL Final     Assessment/Plan 1. DM (diabetes mellitus), type 2, uncontrolled, with renal complications Initiate 45 units Toujeo BID, Titrate up as taught. - Insulin Glargine (TOUJEO SOLOSTAR) 300 UNIT/ML SOPN; Inject 45 Units into the skin 2 (two) times daily before a meal. Titrate up as needed.  Dispense: 10 pen; Refill: 11  2. OBESITY, MORBID Weight remains stable Will continue to implement dietary changes  3. Chronic diastolic congestive heart failure Compensated  4. Chronic obstructive pulmonary disease, unspecified COPD, unspecified chronic bronchitis type Controlled, continue Symbicort,   5. Essential hypertension Controlled.  6. Hyperlipidemia Continue Pravastatin  7. Long term current use of anticoagulant therapy subtherapeutic at 1.29 on  - Protime-INR

## 2014-10-08 ENCOUNTER — Ambulatory Visit (INDEPENDENT_AMBULATORY_CARE_PROVIDER_SITE_OTHER): Payer: Medicare PPO | Admitting: Internal Medicine

## 2014-10-08 DIAGNOSIS — Z5181 Encounter for therapeutic drug level monitoring: Secondary | ICD-10-CM | POA: Insufficient documentation

## 2014-10-08 DIAGNOSIS — Z86718 Personal history of other venous thrombosis and embolism: Secondary | ICD-10-CM

## 2014-10-08 DIAGNOSIS — Z86711 Personal history of pulmonary embolism: Secondary | ICD-10-CM

## 2014-10-08 LAB — PROTIME-INR
INR: 1.6 — ABNORMAL HIGH (ref 0.8–1.2)
Prothrombin Time: 16.7 s — ABNORMAL HIGH (ref 9.1–12.0)

## 2014-10-08 LAB — POCT INR: INR: 1.9

## 2014-10-09 ENCOUNTER — Other Ambulatory Visit: Payer: Self-pay | Admitting: *Deleted

## 2014-10-09 DIAGNOSIS — R791 Abnormal coagulation profile: Secondary | ICD-10-CM

## 2014-10-13 ENCOUNTER — Other Ambulatory Visit: Payer: Self-pay | Admitting: Surgery

## 2014-10-13 ENCOUNTER — Other Ambulatory Visit: Payer: Medicare PPO

## 2014-10-13 ENCOUNTER — Other Ambulatory Visit: Payer: Self-pay | Admitting: *Deleted

## 2014-10-13 DIAGNOSIS — G894 Chronic pain syndrome: Secondary | ICD-10-CM

## 2014-10-13 DIAGNOSIS — R791 Abnormal coagulation profile: Secondary | ICD-10-CM

## 2014-10-13 DIAGNOSIS — D242 Benign neoplasm of left breast: Secondary | ICD-10-CM

## 2014-10-13 MED ORDER — OXYCODONE-ACETAMINOPHEN 10-325 MG PO TABS: ORAL_TABLET | ORAL | Status: DC

## 2014-10-14 ENCOUNTER — Other Ambulatory Visit: Payer: Self-pay | Admitting: Surgery

## 2014-10-14 ENCOUNTER — Other Ambulatory Visit: Payer: Self-pay

## 2014-10-14 DIAGNOSIS — N6320 Unspecified lump in the left breast, unspecified quadrant: Secondary | ICD-10-CM

## 2014-10-14 LAB — PROTIME-INR
INR: 1.4 — ABNORMAL HIGH (ref 0.8–1.2)
Prothrombin Time: 14.8 s — ABNORMAL HIGH (ref 9.1–12.0)

## 2014-10-15 ENCOUNTER — Ambulatory Visit (INDEPENDENT_AMBULATORY_CARE_PROVIDER_SITE_OTHER): Payer: Medicare PPO | Admitting: Cardiovascular Disease

## 2014-10-15 ENCOUNTER — Other Ambulatory Visit: Payer: Self-pay | Admitting: Nurse Practitioner

## 2014-10-15 DIAGNOSIS — Z86718 Personal history of other venous thrombosis and embolism: Secondary | ICD-10-CM

## 2014-10-15 DIAGNOSIS — Z86711 Personal history of pulmonary embolism: Secondary | ICD-10-CM

## 2014-10-15 DIAGNOSIS — Z5181 Encounter for therapeutic drug level monitoring: Secondary | ICD-10-CM

## 2014-10-15 LAB — POCT INR: INR: 1.7

## 2014-10-21 ENCOUNTER — Telehealth: Payer: Self-pay | Admitting: *Deleted

## 2014-10-21 NOTE — Telephone Encounter (Signed)
David with [facility] called and stated that patient was responding well to PT and wants verbal order to continue. Verbal orders given.

## 2014-10-23 ENCOUNTER — Ambulatory Visit (INDEPENDENT_AMBULATORY_CARE_PROVIDER_SITE_OTHER): Payer: Medicare PPO | Admitting: Cardiology

## 2014-10-23 DIAGNOSIS — Z5181 Encounter for therapeutic drug level monitoring: Secondary | ICD-10-CM

## 2014-10-23 DIAGNOSIS — Z86718 Personal history of other venous thrombosis and embolism: Secondary | ICD-10-CM

## 2014-10-23 DIAGNOSIS — Z86711 Personal history of pulmonary embolism: Secondary | ICD-10-CM

## 2014-10-23 LAB — POCT INR: INR: 2.1

## 2014-10-26 ENCOUNTER — Other Ambulatory Visit: Payer: Self-pay | Admitting: *Deleted

## 2014-10-26 MED ORDER — CLONAZEPAM 1 MG PO TABS: ORAL_TABLET | ORAL | Status: DC

## 2014-10-26 NOTE — Telephone Encounter (Signed)
[facility] [place] [facility] 

## 2014-10-27 ENCOUNTER — Ambulatory Visit (INDEPENDENT_AMBULATORY_CARE_PROVIDER_SITE_OTHER): Payer: Medicare PPO | Admitting: Internal Medicine

## 2014-10-27 ENCOUNTER — Encounter: Payer: Self-pay | Admitting: Internal Medicine

## 2014-10-27 VITALS — BP 140/78 | HR 78 | Temp 97.4°F | Resp 22 | Ht 67.0 in | Wt 366.0 lb

## 2014-10-27 DIAGNOSIS — Z7901 Long term (current) use of anticoagulants: Secondary | ICD-10-CM | POA: Diagnosis not present

## 2014-10-27 DIAGNOSIS — E1129 Type 2 diabetes mellitus with other diabetic kidney complication: Secondary | ICD-10-CM | POA: Diagnosis not present

## 2014-10-27 DIAGNOSIS — I10 Essential (primary) hypertension: Secondary | ICD-10-CM

## 2014-10-27 DIAGNOSIS — G4700 Insomnia, unspecified: Secondary | ICD-10-CM | POA: Diagnosis not present

## 2014-10-27 DIAGNOSIS — M1A9XX1 Chronic gout, unspecified, with tophus (tophi): Secondary | ICD-10-CM | POA: Diagnosis not present

## 2014-10-27 DIAGNOSIS — J449 Chronic obstructive pulmonary disease, unspecified: Secondary | ICD-10-CM

## 2014-10-27 DIAGNOSIS — M109 Gout, unspecified: Secondary | ICD-10-CM | POA: Insufficient documentation

## 2014-10-27 DIAGNOSIS — N63 Unspecified lump in unspecified breast: Secondary | ICD-10-CM

## 2014-10-27 DIAGNOSIS — E1165 Type 2 diabetes mellitus with hyperglycemia: Secondary | ICD-10-CM

## 2014-10-27 DIAGNOSIS — I5032 Chronic diastolic (congestive) heart failure: Secondary | ICD-10-CM

## 2014-10-27 DIAGNOSIS — IMO0002 Reserved for concepts with insufficient information to code with codable children: Secondary | ICD-10-CM

## 2014-10-27 HISTORY — DX: Unspecified lump in unspecified breast: N63.0

## 2014-10-27 MED ORDER — ALLOPURINOL 300 MG PO TABS: 300.0000 mg | ORAL_TABLET | Freq: Every day | ORAL | Status: DC

## 2014-10-27 NOTE — Progress Notes (Addendum)
Patient ID: [name], female   DOB: 04-23-1935, 80 y.o.   MRN: [id number]    Facility  [facility]    Place of Service:   OFFICE    Allergies  Allergen Reactions  . Sulfonamide Derivatives Swelling    Mouth swelling  . Tramadol Nausea And Vomiting    Chief Complaint  Patient presents with  . Medical Management of Chronic Issues    HPI:  DM (diabetes mellitus), type 2, uncontrolled, with renal complications: under much better control. Morning glucose generally in the 100 to 160 mg% range.  Essential hypertension: controlled  Long term current use of anticoagulant therapy: remains on warfarin  Chronic diastolic congestive heart failure: compensated. Mild dyspnea. Pedal edema is improved  Insomnia: temazepam did not help. Still relying on clonazepam to help her sleep. Admits to depression and ruminations about her dead husband.  Gout with tophus, unspecified cause, unspecified chronicity, unspecified site: one attack in the last month. Currently pain free.  Chronic obstructive pulmonary disease, unspecified COPD, unspecified chronic bronchitis type: denies much sputum at this time. Has mild dyspnea at rest.  Breast mass: surgery was delayed due to the glucose being out of control. Control has improved.    Medications: Patient's Medications  New Prescriptions   No medications on file  Previous Medications   ACETAMINOPHEN (TYLENOL) 325 MG TABLET    Take 2 tablets (650 mg total) by mouth every 6 (six) hours as needed for mild pain (or Fever >/= 101).   ALBUTEROL (PROVENTIL HFA;VENTOLIN HFA) 108 (90 BASE) MCG/ACT INHALER    Inhale 2 puffs into the lungs every 6 (six) hours as needed for wheezing or shortness of breath.   BUDESONIDE-FORMOTEROL (SYMBICORT) 160-4.5 MCG/ACT INHALER    In 2 puffs twice daily to help wheezing   CALCIUM CARBONATE ANTACID (TUMS PO)    Take 3 tablets by mouth daily as needed (acid reflux).   CINNAMON 500 MG CAPSULE    Take 500 mg by mouth 2 (two) times  daily.   CLONAZEPAM (KLONOPIN) 1 MG TABLET    Take two tablets at bedtime for sleep   COLCHICINE 0.6 MG TABLET    TAKE ONE TABLET BY MOUTH ONCE DAILY FOR  GOUT   DIPHENHYDRAMINE (BENADRYL) 25 MG TABLET    Take 50 mg by mouth at bedtime. For itching   DULOXETINE (CYMBALTA) 60 MG CAPSULE    Take 1 capsule by mouth every day for anxiety   FUROSEMIDE (LASIX) 20 MG TABLET    Take one tablet by mouth twice daily for edema   GUAIFENESIN-DEXTROMETHORPHAN (ROBITUSSIN DM) 100-10 MG/5ML SYRUP    Take 5 mLs by mouth every 4 (four) hours as needed for cough.   INSULIN GLARGINE (TOUJEO SOLOSTAR) 300 UNIT/ML SOPN    Inject 45 Units into the skin 2 (two) times daily before a meal. Titrate up as needed.   INSULIN LISPRO, HUMAN, (HUMALOG KWIKPEN) 200 UNIT/ML SOPN    Inject 35 Units into the skin 3 (three) times daily before meals.   ISOSORBIDE MONONITRATE (IMDUR) 60 MG 24 HR TABLET    Take 1 tablet (60 mg total) by mouth daily.   LANCETS (ONETOUCH ULTRASOFT) LANCETS    Use to test blood sugar Dx: E11.9   LINACLOTIDE (LINZESS) 145 MCG CAPS CAPSULE    Take 1 capsule (145 mcg total) by mouth daily. Sample provided   MECLIZINE (ANTIVERT) 25 MG TABLET    Take 1 tablet (25 mg total) by mouth 3 (three) times daily.  METOPROLOL SUCCINATE (TOPROL-XL) 25 MG 24 HR TABLET    Take 1 tablet by mouth once every day for blood pressure   NITROGLYCERIN (NITROSTAT) 0.4 MG SL TABLET    Take one tablet under the tongue every 5 minutes as needed for chest pain   OXYCODONE-ACETAMINOPHEN (PERCOCET) 10-325 MG PER TABLET    Take one tablet every 6 hours as needed for pain   PANTOPRAZOLE (PROTONIX) 40 MG TABLET    TAKE ONE TABLET BY MOUTH ONCE DAILY   PRAVASTATIN (PRAVACHOL) 20 MG TABLET    Take 1 tablet by mouth once every day for cholesterol   TEMAZEPAM (RESTORIL) 30 MG CAPSULE    One at bed for insomnia   WARFARIN (COUMADIN) 5 MG TABLET    Take 2 tablets (10 mg total) by mouth daily. Please take 7 mg oral daily on Tues, Thursday, Sat,  then 5 mg on Mon, Wed Fri, Sun.  Modified Medications   No medications on file  Discontinued Medications   No medications on file     Review of Systems  Constitutional: Negative for fever, chills, diaphoresis, activity change, appetite change and fatigue.       Morbidly obese and dietary noncompliance.  HENT: Negative for ear pain and sore throat.   Eyes: Negative for visual disturbance.  Respiratory: Positive for chest tightness, shortness of breath and wheezing.        History of right breast mass  Cardiovascular: Positive for leg swelling. Negative for chest pain and palpitations.  Gastrointestinal: Negative for nausea, vomiting, abdominal pain, diarrhea, constipation and blood in stool.  Endocrine: Positive for polydipsia and polyuria.       Poorly controlled diabetic  Genitourinary: Negative for dysuria.       Wakes 5 times nightly to urinate  Musculoskeletal: Positive for back pain, joint swelling, arthralgias and gait problem.  Skin: Negative for rash.  Neurological: Negative for dizziness, tremors and numbness. Headaches: chronic, recurrent, tension-type.  Psychiatric/Behavioral: Positive for dysphoric mood. Negative for sleep disturbance. The patient is not nervous/anxious.     Filed Vitals:   10/27/14 1355  BP: 140/78  Pulse: 78  Temp: 97.4 F (36.3 C)  TempSrc: Oral  Resp: 22  Height: 5' 7" (1.702 m)  Weight: 366 lb (166.017 kg)  SpO2: 95%   Body mass index is 57.31 kg/(m^2).  Physical Exam  Constitutional: She is oriented to person, place, and time. She appears well-developed and well-nourished.  Sitting in w/c. Morbid obesity.  HENT:  Mouth/Throat: Oropharynx is clear and moist. No oropharyngeal exudate.  Eyes: Pupils are equal, round, and reactive to light. No scleral icterus.  Neck: Neck supple. No tracheal deviation present. No thyromegaly present.  Cardiovascular: Normal rate, regular rhythm and intact distal pulses.  Exam reveals no gallop and no  friction rub.   Murmur (1/6 SEM) heard. +1 pitting LE edema b/l. no calf TTP. No carotid bruit b/l  Pulmonary/Chest: Effort normal. No stridor. No respiratory distress. She has decreased breath sounds. She has wheezes. She has no rales.  Abdominal: Soft. Bowel sounds are normal. She exhibits no distension and no mass. There is no tenderness. There is no rebound and no guarding.  Musculoskeletal: She exhibits edema and tenderness.  B/l bunions with hammertoes. Right 1st toenail absent. Monofilament testing intact b/l  Lymphadenopathy:    She has no cervical adenopathy.  Neurological: She is alert and oriented to person, place, and time. She has normal reflexes.  Skin: Skin is warm and dry. No rash noted.  Psychiatric: Her behavior is normal. Judgment and thought content normal. She exhibits a depressed mood.     Labs reviewed: Anti-coag visit on 10/23/2014  Component Date Value Ref Range Status  . INR 10/23/2014 2.1   Final   [place], RN, [facility]  Anti-coag visit on 10/15/2014  Component Date Value Ref Range Status  . INR 10/15/2014 1.7   Final   [facility]  Appointment on 10/13/2014  Component Date Value Ref Range Status  . INR 10/13/2014 1.4* 0.8 - 1.2 Final   Comment: Reference interval is for non-anticoagulated patients. Suggested INR therapeutic range for Vitamin K antagonist therapy:    Standard Dose (moderate intensity                   therapeutic range):       2.0 - 3.0    Higher intensity therapeutic range       2.5 - 3.5   . Prothrombin Time 10/13/2014 14.8* 9.1 - 12.0 sec Final  Anti-coag visit on 10/08/2014  Component Date Value Ref Range Status  . INR 10/08/2014 1.9   Final   [facility]  Office Visit on 10/07/2014  Component Date Value Ref Range Status  . INR 10/07/2014 1.6* 0.8 - 1.2 Final   Comment: Reference interval is for non-anticoagulated patients. Suggested INR therapeutic range for Vitamin K antagonist therapy:    Standard Dose (moderate intensity                    therapeutic range):       2.0 - 3.0    Higher intensity therapeutic range       2.5 - 3.5   . Prothrombin Time 10/07/2014 16.7* 9.1 - 12.0 sec Final  Admission on 09/24/2014, Discharged on 09/26/2014  Component Date Value Ref Range Status  . Sodium 09/24/2014 136  135 - 145 mmol/L Final  . Potassium 09/24/2014 5.3* 3.5 - 5.1 mmol/L Final  . Chloride 09/24/2014 101  101 - 111 mmol/L Final  . CO2 09/24/2014 25  22 - 32 mmol/L Final  . Glucose, Bld 09/24/2014 325* 65 - 99 mg/dL Final  . BUN 09/24/2014 20  6 - 20 mg/dL Final  . Creatinine, Ser 09/24/2014 1.28* 0.44 - 1.00 mg/dL Final  . Calcium 09/24/2014 8.9  8.9 - 10.3 mg/dL Final  . Total Protein 09/24/2014 8.4* 6.5 - 8.1 g/dL Final  . Albumin 09/24/2014 2.9* 3.5 - 5.0 g/dL Final  . AST 09/24/2014 22  15 - 41 U/L Final  . ALT 09/24/2014 21  14 - 54 U/L Final  . Alkaline Phosphatase 09/24/2014 84  38 - 126 U/L Final  . Total Bilirubin 09/24/2014 0.4  0.3 - 1.2 mg/dL Final  . GFR calc non Af Amer 09/24/2014 39* >60 mL/min Final  . GFR calc Af Amer 09/24/2014 46* >60 mL/min Final   Comment: (NOTE) The eGFR has been calculated using the CKD EPI equation. This calculation has not been validated in all clinical situations. eGFR's persistently <60 mL/min signify possible Chronic Kidney Disease.   . Anion gap 09/24/2014 10  5 - 15 Final  . WBC 09/24/2014 8.9  4.0 - 10.5 K/uL Final  . RBC 09/24/2014 4.07  3.87 - 5.11 MIL/uL Final  . Hemoglobin 09/24/2014 10.7* 12.0 - 15.0 g/dL Final  . HCT 09/24/2014 34.9* 36.0 - 46.0 % Final  . MCV 09/24/2014 85.7  78.0 - 100.0 fL Final  . MCH 09/24/2014 26.3  26.0 - 34.0 pg Final  . MCHC  09/24/2014 30.7  30.0 - 36.0 g/dL Final  . RDW 09/24/2014 15.1  11.5 - 15.5 % Final  . Platelets 09/24/2014 319  150 - 400 K/uL Final  . Neutrophils Relative % 09/24/2014 66  43 - 77 % Final  . Neutro Abs 09/24/2014 5.9  1.7 - 7.7 K/uL Final  . Lymphocytes Relative 09/24/2014 24  12 - 46 % Final  . Lymphs Abs  09/24/2014 2.1  0.7 - 4.0 K/uL Final  . Monocytes Relative 09/24/2014 6  3 - 12 % Final  . Monocytes Absolute 09/24/2014 0.5  0.1 - 1.0 K/uL Final  . Eosinophils Relative 09/24/2014 4  0 - 5 % Final  . Eosinophils Absolute 09/24/2014 0.4  0.0 - 0.7 K/uL Final  . Basophils Relative 09/24/2014 0  0 - 1 % Final  . Basophils Absolute 09/24/2014 0.0  0.0 - 0.1 K/uL Final  . Troponin i, poc 09/24/2014 0.01  0.00 - 0.08 ng/mL Final  . Comment 3 09/24/2014          Final   Comment: Due to the release kinetics of cTnI, a negative result within the first hours of the onset of symptoms does not rule out myocardial infarction with certainty. If myocardial infarction is still suspected, repeat the test at appropriate intervals.   . Color, Urine 09/24/2014 YELLOW  YELLOW Final  . APPearance 09/24/2014 CLOUDY* CLEAR Final  . Specific Gravity, Urine 09/24/2014 1.016  1.005 - 1.030 Final  . pH 09/24/2014 5.0  5.0 - 8.0 Final  . Glucose, UA 09/24/2014 NEGATIVE  NEGATIVE mg/dL Final  . Hgb urine dipstick 09/24/2014 TRACE* NEGATIVE Final  . Bilirubin Urine 09/24/2014 NEGATIVE  NEGATIVE Final  . Ketones, ur 09/24/2014 NEGATIVE  NEGATIVE mg/dL Final  . Protein, ur 09/24/2014 NEGATIVE  NEGATIVE mg/dL Final  . Urobilinogen, UA 09/24/2014 0.2  0.0 - 1.0 mg/dL Final  . Nitrite 09/24/2014 NEGATIVE  NEGATIVE Final  . Leukocytes, UA 09/24/2014 MODERATE* NEGATIVE Final  . Squamous Epithelial / LPF 09/24/2014 FEW* RARE Final  . WBC, UA 09/24/2014 11-20  <3 WBC/hpf Final  . RBC / HPF 09/24/2014 0-2  <3 RBC/hpf Final  . Bacteria, UA 09/24/2014 MANY* RARE Final  . Sodium 09/25/2014 135  135 - 145 mmol/L Final  . Potassium 09/25/2014 5.3* 3.5 - 5.1 mmol/L Final  . Chloride 09/25/2014 99* 101 - 111 mmol/L Final  . CO2 09/25/2014 27  22 - 32 mmol/L Final  . Glucose, Bld 09/25/2014 283* 65 - 99 mg/dL Final  . BUN 09/25/2014 22* 6 - 20 mg/dL Final  . Creatinine, Ser 09/25/2014 1.32* 0.44 - 1.00 mg/dL Final  .  Calcium 09/25/2014 8.8* 8.9 - 10.3 mg/dL Final  . GFR calc non Af Amer 09/25/2014 38* >60 mL/min Final  . GFR calc Af Amer 09/25/2014 44* >60 mL/min Final   Comment: (NOTE) The eGFR has been calculated using the CKD EPI equation. This calculation has not been validated in all clinical situations. eGFR's persistently <60 mL/min signify possible Chronic Kidney Disease.   . Anion gap 09/25/2014 9  5 - 15 Final  . WBC 09/25/2014 10.4  4.0 - 10.5 K/uL Final  . RBC 09/25/2014 3.63* 3.87 - 5.11 MIL/uL Final  . Hemoglobin 09/25/2014 9.7* 12.0 - 15.0 g/dL Final  . HCT 09/25/2014 30.9* 36.0 - 46.0 % Final  . MCV 09/25/2014 85.1  78.0 - 100.0 fL Final  . MCH 09/25/2014 26.7  26.0 - 34.0 pg Final  . MCHC 09/25/2014 31.4  30.0 -  36.0 g/dL Final  . RDW 09/25/2014 15.0  11.5 - 15.5 % Final  . Platelets 09/25/2014 306  150 - 400 K/uL Final  . Sed Rate 09/25/2014 110* 0 - 22 mm/hr Final  . Glucose-Capillary 09/25/2014 243* 65 - 99 mg/dL Final  . Prothrombin Time 09/25/2014 16.4* 11.6 - 15.2 seconds Final  . INR 09/25/2014 1.31  0.00 - 1.49 Final  . Glucose-Capillary 09/25/2014 235* 65 - 99 mg/dL Final  . Glucose-Capillary 09/25/2014 264* 65 - 99 mg/dL Final  . Glucose-Capillary 09/25/2014 246* 65 - 99 mg/dL Final  . Comment 1 09/25/2014 Notify RN   Final  . Comment 2 09/25/2014 Document in Chart   Final  . Prothrombin Time 09/26/2014 16.2* 11.6 - 15.2 seconds Final  . INR 09/26/2014 1.29  0.00 - 1.49 Final  . Glucose-Capillary 09/25/2014 236* 65 - 99 mg/dL Final  . Comment 1 09/25/2014 Notify RN   Final  . Comment 2 09/25/2014 Document in Chart   Final  . Glucose-Capillary 09/26/2014 130* 65 - 99 mg/dL Final  . Comment 1 09/26/2014 Notify RN   Final  . Comment 2 09/26/2014 Document in Chart   Final  . Glucose-Capillary 09/26/2014 97  65 - 99 mg/dL Final  Hospital Outpatient Visit on 09/17/2014  Component Date Value Ref Range Status  . Sodium 09/17/2014 139  135 - 145 mmol/L Final  . Potassium  09/17/2014 4.7  3.5 - 5.1 mmol/L Final  . Chloride 09/17/2014 104  101 - 111 mmol/L Final  . CO2 09/17/2014 24  22 - 32 mmol/L Final  . Glucose, Bld 09/17/2014 216* 65 - 99 mg/dL Final  . BUN 09/17/2014 20  6 - 20 mg/dL Final  . Creatinine, Ser 09/17/2014 1.33* 0.44 - 1.00 mg/dL Final  . Calcium 09/17/2014 8.8* 8.9 - 10.3 mg/dL Final  . GFR calc non Af Amer 09/17/2014 37* >60 mL/min Final  . GFR calc Af Amer 09/17/2014 43* >60 mL/min Final   Comment: (NOTE) The eGFR has been calculated using the CKD EPI equation. This calculation has not been validated in all clinical situations. eGFR's persistently <60 mL/min signify possible Chronic Kidney Disease.   . Anion gap 09/17/2014 11  5 - 15 Final  . WBC 09/17/2014 7.9  4.0 - 10.5 K/uL Final  . RBC 09/17/2014 3.85* 3.87 - 5.11 MIL/uL Final  . Hemoglobin 09/17/2014 9.9* 12.0 - 15.0 g/dL Final  . HCT 09/17/2014 32.6* 36.0 - 46.0 % Final  . MCV 09/17/2014 84.7  78.0 - 100.0 fL Final  . MCH 09/17/2014 25.7* 26.0 - 34.0 pg Final  . MCHC 09/17/2014 30.4  30.0 - 36.0 g/dL Final  . RDW 09/17/2014 14.8  11.5 - 15.5 % Final  . Platelets 09/17/2014 341  150 - 400 K/uL Final  Anti-coag visit on 09/07/2014  Component Date Value Ref Range Status  . INR 09/07/2014 2.0   Final  Lab on 09/03/2014  Component Date Value Ref Range Status  . Hgb A1c MFr Bld 09/03/2014 10.7* 4.8 - 5.6 % Final   Comment:          Pre-diabetes: 5.7 - 6.4          Diabetes: >6.4          Glycemic control for adults with diabetes: <7.0   . Est. average glucose Bld gHb Est-m* 09/03/2014 260   Final  . Glucose 09/03/2014 258* 65 - 99 mg/dL Final  . BUN 09/03/2014 20  8 - 27 mg/dL Final  .  Creatinine, Ser 09/03/2014 1.19* 0.57 - 1.00 mg/dL Final  . GFR calc non Af Amer 09/03/2014 44* >59 mL/min/1.73 Final  . GFR calc Af Amer 09/03/2014 51* >59 mL/min/1.73 Final  . BUN/Creatinine Ratio 09/03/2014 17  11 - 26 Final  . Sodium 09/03/2014 139  134 - 144 mmol/L Final  . Potassium  09/03/2014 4.6  3.5 - 5.2 mmol/L Final  . Chloride 09/03/2014 99  97 - 108 mmol/L Final  . CO2 09/03/2014 22  18 - 29 mmol/L Final  . Calcium 09/03/2014 8.7  8.7 - 10.3 mg/dL Final  . Total Protein 09/03/2014 7.2  6.0 - 8.5 g/dL Final  . Albumin 09/03/2014 3.5  3.5 - 4.8 g/dL Final  . Globulin, Total 09/03/2014 3.7  1.5 - 4.5 g/dL Final  . Albumin/Globulin Ratio 09/03/2014 0.9* 1.1 - 2.5 Final  . Bilirubin Total 09/03/2014 0.2  0.0 - 1.2 mg/dL Final  . Alkaline Phosphatase 09/03/2014 96  39 - 117 IU/L Final  . AST 09/03/2014 17  0 - 40 IU/L Final  . ALT 09/03/2014 16  0 - 32 IU/L Final  Anti-coag visit on 08/10/2014  Component Date Value Ref Range Status  . INR 08/10/2014 2.0   Final  There may be more visits with results that are not included.   Assessment/Plan  1. DM (diabetes mellitus), type 2, uncontrolled, with renal complications Improved control. She is now approved from my standpoint to undergo the delayed breast surgery. - Hemoglobin A1c; Future - Comprehensive metabolic panel; Future - Microalbumin, urine; Future  2. Essential hypertension controlled - Comprehensive metabolic panel; Future  3. Long term current use of anticoagulant therapy Will need to stop the warfarin 5 days prior to surgery  4. Chronic diastolic congestive heart failure compensated  5. Insomnia Stop temazepam and continue with clonazepam  6. Gout with tophus, unspecified cause, unspecified chronicity, unspecified site - allopurinol (ZYLOPRIM) 300 MG tablet; Take 1 tablet (300 mg total) by mouth daily.  Dispense: 30 tablet; Refill: 6 - Uric acid; Future  7. Chronic obstructive pulmonary disease, unspecified COPD, unspecified chronic bronchitis type stable  8. Breast mass Schedule surgery

## 2014-10-27 NOTE — Patient Instructions (Addendum)
Use either Toujeo or Lantus, but not both. Toujeo is preferred. Start allopurinol and stop colchicine. Use up and stop the temazepam. Go ahead with surgery as planned.

## 2014-10-28 ENCOUNTER — Other Ambulatory Visit (HOSPITAL_COMMUNITY): Payer: Self-pay | Admitting: *Deleted

## 2014-10-28 ENCOUNTER — Inpatient Hospital Stay (HOSPITAL_COMMUNITY)
Admission: RE | Admit: 2014-10-28 | Discharge: 2014-10-28 | Disposition: A | Discharge status: Home / Self Care | Payer: Medicare PPO | Source: Ambulatory Visit

## 2014-10-28 NOTE — Progress Notes (Signed)
Called Dr. Trevor Mace office to see if they had a clearance from pt's PCP about her diabetes. Spoke with Abigail Butts and she will check with Dr. Trevor Mace nurse and have her call me back.

## 2014-10-28 NOTE — Pre-Procedure Instructions (Signed)
[name]  10/28/2014     Your procedure is scheduled on Thursday, November 05, 2014 at 7:30 AM.   Report to [facility] Entrance "A" Admitting Office at 5:30 AM.   Call this number if you have problems the morning of surgery: [phone]   Any questions prior to day of surgery, please call [phone] between 8 & 4 PM.   Remember:  Do not eat food or drink liquids after midnight Wednesday, 11/04/14.  Take these medicines the morning of surgery with A SIP OF WATER: Duloxetine (Cymbalta), Isosorbide Mononitrate (Imdur), Metoprolol (Toprol-XL), Pantoprazole (Protonix), Symbicort inhaler,  Pain pill if needed.  Stop Coumadin (Warfarin) as instructed by physician. Stop all vitamins, herbal medications 7 days prior to surgery.  Do not take any diabetic medications the morning of surgery.   Do not wear jewelry, make-up or nail polish.  Do not wear lotions, powders, or perfumes.  You may NOT wear deodorant.  Do not shave 48 hours prior to surgery.  Men may shave face and neck.  Do not bring valuables to the hospital.  [facility] is not responsible for any belongings or valuables.  Contacts, dentures or bridgework may not be worn into surgery.  Leave your suitcase in the car.  After surgery it may be brought to your room.  For patients admitted to the hospital, discharge time will be determined by your treatment team.  Patients discharged the day of surgery will not be allowed to drive home.   Special instructions:  [facility] - Preparing for Surgery  Before surgery, you can play an important role.  Because skin is not sterile, your skin needs to be as free of germs as possible.  You can reduce the number of germs on you skin by washing with CHG (chlorahexidine gluconate) soap before surgery.  CHG is an antiseptic cleaner which kills germs and bonds with the skin to continue killing germs even after washing.  Please DO NOT use if you have an allergy to CHG or antibacterial  soaps.  If your skin becomes reddened/irritated stop using the CHG and inform your nurse when you arrive at Short Stay.  Do not shave (including legs and underarms) for at least 48 hours prior to the first CHG shower.  You may shave your face.  Please follow these instructions carefully:   1.  Shower with CHG Soap the night before surgery and the                                morning of Surgery.  2.  If you choose to wash your hair, wash your hair first as usual with your       normal shampoo.  3.  After you shampoo, rinse your hair and body thoroughly to remove the                      Shampoo.  4.  Use CHG as you would any other liquid soap.  You can apply chg directly       to the skin and wash gently with scrungie or a clean washcloth.  5.  Apply the CHG Soap to your body ONLY FROM THE NECK DOWN.        Do not use on open wounds or open sores.  Avoid contact with your eyes, ears, mouth and genitals (private parts).  Wash genitals (private parts) with your  normal soap.  6.  Wash thoroughly, paying special attention to the area where your surgery        will be performed.  7.  Thoroughly rinse your body with warm water from the neck down.  8.  DO NOT shower/wash with your normal soap after using and rinsing off       the CHG Soap.  9.  Pat yourself dry with a clean towel.            10.  Wear clean pajamas.            11.  Place clean sheets on your bed the night of your first shower and do not        sleep with pets.  Day of Surgery  Do not apply any lotions/deodorants the morning of surgery.  Please wear clean clothes to the hospital.    Please read over the following fact sheets that you were given. Pain Booklet, Coughing and Deep Breathing and Surgical Site Infection Prevention

## 2014-10-28 NOTE — Progress Notes (Signed)
[name], from Dr. Trevor Mace office called back and stated that Dr. Ninfa Linden does not have anything about pt's diabetes being under control and that we would need to get that from her PCP.  Pt did not show for her PAT appt, called her and she had forgotten about the appt. I informed her that our scheduler would call her and set up another appt.  I called Dr. Rolly Salter office (pt's PCP) and left message on the nurse triage voicemail that we needed a note either put into EPIC or faxed to [place] stating that pt's diabetes is under control.

## 2014-10-29 ENCOUNTER — Telehealth: Payer: Self-pay | Admitting: *Deleted

## 2014-10-29 ENCOUNTER — Encounter (HOSPITAL_COMMUNITY): Payer: Self-pay

## 2014-10-29 ENCOUNTER — Encounter (HOSPITAL_COMMUNITY)
Admission: RE | Admit: 2014-10-29 | Discharge: 2014-10-29 | Disposition: A | Discharge status: Home / Self Care | Payer: Medicare PPO | Source: Ambulatory Visit | Attending: Surgery | Admitting: Surgery

## 2014-10-29 VITALS — BP 143/54 | HR 86 | Temp 98.0°F | Resp 20 | Ht 67.0 in | Wt 366.0 lb

## 2014-10-29 DIAGNOSIS — Z86711 Personal history of pulmonary embolism: Secondary | ICD-10-CM | POA: Diagnosis not present

## 2014-10-29 DIAGNOSIS — D242 Benign neoplasm of left breast: Secondary | ICD-10-CM

## 2014-10-29 DIAGNOSIS — M1990 Unspecified osteoarthritis, unspecified site: Secondary | ICD-10-CM | POA: Diagnosis not present

## 2014-10-29 DIAGNOSIS — Z79899 Other long term (current) drug therapy: Secondary | ICD-10-CM | POA: Insufficient documentation

## 2014-10-29 DIAGNOSIS — Z7901 Long term (current) use of anticoagulants: Secondary | ICD-10-CM | POA: Diagnosis not present

## 2014-10-29 DIAGNOSIS — G4733 Obstructive sleep apnea (adult) (pediatric): Secondary | ICD-10-CM | POA: Insufficient documentation

## 2014-10-29 DIAGNOSIS — I252 Old myocardial infarction: Secondary | ICD-10-CM | POA: Diagnosis not present

## 2014-10-29 DIAGNOSIS — E785 Hyperlipidemia, unspecified: Secondary | ICD-10-CM | POA: Diagnosis not present

## 2014-10-29 DIAGNOSIS — Z86718 Personal history of other venous thrombosis and embolism: Secondary | ICD-10-CM | POA: Insufficient documentation

## 2014-10-29 DIAGNOSIS — Z01818 Encounter for other preprocedural examination: Secondary | ICD-10-CM | POA: Insufficient documentation

## 2014-10-29 DIAGNOSIS — Z01812 Encounter for preprocedural laboratory examination: Secondary | ICD-10-CM | POA: Diagnosis not present

## 2014-10-29 DIAGNOSIS — E119 Type 2 diabetes mellitus without complications: Secondary | ICD-10-CM | POA: Diagnosis not present

## 2014-10-29 DIAGNOSIS — I2510 Atherosclerotic heart disease of native coronary artery without angina pectoris: Secondary | ICD-10-CM | POA: Diagnosis not present

## 2014-10-29 DIAGNOSIS — Z794 Long term (current) use of insulin: Secondary | ICD-10-CM | POA: Diagnosis not present

## 2014-10-29 DIAGNOSIS — K219 Gastro-esophageal reflux disease without esophagitis: Secondary | ICD-10-CM | POA: Diagnosis not present

## 2014-10-29 DIAGNOSIS — M353 Polymyalgia rheumatica: Secondary | ICD-10-CM | POA: Insufficient documentation

## 2014-10-29 DIAGNOSIS — I509 Heart failure, unspecified: Secondary | ICD-10-CM | POA: Diagnosis not present

## 2014-10-29 LAB — BASIC METABOLIC PANEL
Anion gap: 8 (ref 5–15)
BUN: 16 mg/dL (ref 6–20)
CO2: 27 mmol/L (ref 22–32)
Calcium: 8.7 mg/dL — ABNORMAL LOW (ref 8.9–10.3)
Chloride: 101 mmol/L (ref 101–111)
Creatinine, Ser: 1.34 mg/dL — ABNORMAL HIGH (ref 0.44–1.00)
GFR calc Af Amer: 43 mL/min — ABNORMAL LOW (ref >60)
GFR calc non Af Amer: 37 mL/min — ABNORMAL LOW (ref >60)
Glucose, Bld: 313 mg/dL — ABNORMAL HIGH (ref 65–99)
Potassium: 4.7 mmol/L (ref 3.5–5.1)
Sodium: 136 mmol/L (ref 135–145)

## 2014-10-29 LAB — CBC
HCT: 32.6 % — ABNORMAL LOW (ref 36.0–46.0)
Hemoglobin: 10 g/dL — ABNORMAL LOW (ref 12.0–15.0)
MCH: 25.9 pg — ABNORMAL LOW (ref 26.0–34.0)
MCHC: 30.7 g/dL (ref 30.0–36.0)
MCV: 84.5 fL (ref 78.0–100.0)
Platelets: 305 10*3/uL (ref 150–400)
RBC: 3.86 MIL/uL — ABNORMAL LOW (ref 3.87–5.11)
RDW: 15.9 % — ABNORMAL HIGH (ref 11.5–15.5)
WBC: 9.4 10*3/uL (ref 4.0–10.5)

## 2014-10-29 LAB — GLUCOSE, CAPILLARY: Glucose-Capillary: 275 mg/dL — ABNORMAL HIGH (ref 65–99)

## 2014-10-29 NOTE — Progress Notes (Signed)
Clearance in epic from Dr. Nyoka Cowden. Pt. Was told from Dr. Nyoka Cowden to stop coumadin 5 days prior to surgery. States blood sugars have been running < 150 fasting.

## 2014-10-29 NOTE — Pre-Procedure Instructions (Signed)
[name]  10/29/2014     Your procedure is scheduled on Thursday, November 05, 2014 at 7:30 AM.   Report to [facility] Entrance "A" Admitting Office at 5:30 AM.   Call this number if you have problems the morning of surgery: [phone]   Any questions prior to day of surgery, please call [phone] between 8 & 4 PM.   Remember:  Do not eat food or drink liquids after midnight Wednesday, 11/04/14.  Take these medicines the morning of surgery with A SIP OF WATER: Duloxetine (Cymbalta), Isosorbide Mononitrate (Imdur), Metoprolol (Toprol-XL), Pantoprazole (Protonix), Symbicort inhaler,  Pain pill if needed. Albuterol if needed (bring to hospital), allopurinol,  Stop Coumadin (Warfarin) as instructed by physician. Stop all vitamins, herbal medications 7 days prior to surgery.  Do not take any diabetic medications the morning of surgery.   Do not wear jewelry, make-up or nail polish.  Do not wear lotions, powders, or perfumes.  You may NOT wear deodorant.  Do not shave 48 hours prior to surgery.  Men may shave face and neck.  Do not bring valuables to the hospital.  [facility] is not responsible for any belongings or valuables.  Contacts, dentures or bridgework may not be worn into surgery.  Leave your suitcase in the car.  After surgery it may be brought to your room.  For patients admitted to the hospital, discharge time will be determined by your treatment team.  Patients discharged the day of surgery will not be allowed to drive home.   Special instructions:  [place] - Preparing for Surgery  Before surgery, you can play an important role.  Because skin is not sterile, your skin needs to be as free of germs as possible.  You can reduce the number of germs on you skin by washing with CHG (chlorahexidine gluconate) soap before surgery.  CHG is an antiseptic cleaner which kills germs and bonds with the skin to continue killing germs even after washing.  Please DO  NOT use if you have an allergy to CHG or antibacterial soaps.  If your skin becomes reddened/irritated stop using the CHG and inform your nurse when you arrive at Short Stay.  Do not shave (including legs and underarms) for at least 48 hours prior to the first CHG shower.  You may shave your face.  Please follow these instructions carefully:   1.  Shower with CHG Soap the night before surgery and the                                morning of Surgery.  2.  If you choose to wash your hair, wash your hair first as usual with your       normal shampoo.  3.  After you shampoo, rinse your hair and body thoroughly to remove the                      Shampoo.  4.  Use CHG as you would any other liquid soap.  You can apply chg directly       to the skin and wash gently with scrungie or a clean washcloth.  5.  Apply the CHG Soap to your body ONLY FROM THE NECK DOWN.        Do not use on open wounds or open sores.  Avoid contact with your eyes, ears, mouth and genitals (private parts).  Wash genitals (private parts) with your normal soap.  6.  Wash thoroughly, paying special attention to the area where your surgery        will be performed.  7.  Thoroughly rinse your body with warm water from the neck down.  8.  DO NOT shower/wash with your normal soap after using and rinsing off       the CHG Soap.  9.  Pat yourself dry with a clean towel.            10.  Wear clean pajamas.            11.  Place clean sheets on your bed the night of your first shower and do not        sleep with pets.  Day of Surgery  Do not apply any lotions/deodorants the morning of surgery.  Please wear clean clothes to the hospital.    Please read over the following fact sheets that you were given. Pain Booklet, Coughing and Deep Breathing and Surgical Site Infection Prevention

## 2014-10-29 NOTE — Addendum Note (Signed)
Addended by: Estill Dooms on: 10/29/2014 10:03 AM   Modules accepted: Level of Service

## 2014-10-29 NOTE — Telephone Encounter (Signed)
[name] with [facility] called and requested a letter of diabetic surgery clearance. Needs something stating Diabetes is under control and ok for surgery on 11/05/2014 with Dr. Ninfa Linden. To fax to #: [phone]. Please Advise.

## 2014-10-29 NOTE — Telephone Encounter (Signed)
[name] with [facility] notified.

## 2014-10-29 NOTE — Telephone Encounter (Signed)
She is approved for surgery. This information is in my last note from 10/27/14.

## 2014-10-30 ENCOUNTER — Ambulatory Visit (INDEPENDENT_AMBULATORY_CARE_PROVIDER_SITE_OTHER): Payer: Medicare PPO | Admitting: Cardiovascular Disease

## 2014-10-30 DIAGNOSIS — Z5181 Encounter for therapeutic drug level monitoring: Secondary | ICD-10-CM

## 2014-10-30 DIAGNOSIS — Z86718 Personal history of other venous thrombosis and embolism: Secondary | ICD-10-CM

## 2014-10-30 DIAGNOSIS — Z86711 Personal history of pulmonary embolism: Secondary | ICD-10-CM

## 2014-10-30 LAB — POCT INR: INR: 1.6

## 2014-10-30 NOTE — Progress Notes (Signed)
Patient is a 80 year old female scheduled for radioactive seed localized left breast lumpectomy on 11/05/14 by Dr. [name].  History includes non-smoker, HLD, CAD s/p IMI '04 s/p BMS RCA s/p DES RCA '12, OSA, polymyalgia rheumatica, DVT/PE '13, anemia, CHF, depression, GERD, DM on insulin, vertigo, anxiety, osteoarthritis, gastric bypass '77, hysterectomy. BMI is consistent with morbid obesity (57.35). PCP is Dr. Gildardo Cranker with [facility].  Procedure was originally scheduled for surgery in May but it was cancelled due to poorly controlled DM (HgbA1c 10.7).   Cardiologist Dr. Shelva Majestic cleared her for this procedure back in 08/16/2014 epic note. Her primary cardiologist is Dr. Ena Dawley.  Meds include albuterol, Klonopin, Benadryl, Cymbalta, Lasix, Toujeo, Humalog, Imdur, Linzess, Toprol, Percocet, Protonix, Pravachol, warfarin. Dr. Claiborne Billings instructed her to hold warfarin four days prior to surgery.  09/24/14 EKG: Sinus rhythm. PVC. LVH.   Cardiac cath 06/29/14 done for high risk stress test: - Normal global LV function with mild mid inferior hypocontractility. - Coronary calcification with smooth 20% proximal narrowing followed by 30% stenosis before the first diagonal branch, 50-60% stenosis in the proximal diagonal 1 vessel with 30% mid LAD smooth narrowing; 20% smooth stenosis in the proximal left circumflex: Artery, and widely patent proximal and mid RCA stents without significant RCA stenoses. -Recommedation: Medical therapy.  07/05/14 Echo: Left ventricle: The cavity size was normal. There was moderate concentric hypertrophy. Systolic function was normal. Theestimated ejection fraction was in the range of 60% to 65%. Wallmotion was normal; there were no regional wall motionabnormalities. There was an increased relative contribution ofatrial contraction to ventricular filling. Doppler parameters are consistent with abnormal left ventricular relaxation (grade  1diastolic dysfunction).  06/24/14 CXR: Bibasilar atelectasis.  Preoperative labs noted. Cr 1.34, glucose 313. H/H 10/32.6, which appears overall stable since 06/2014 labs. A1C on 09/03/14 was 10.7. She will be getting an fasting CBG and PT on arrival DOS.   She original surgery was cancelled in May due to poorly controlled DM, pt has seen PCP, Dr. Jeanmarie Hubert. Most recent visit in [place] dated 10/27/2014, in which Dr. Nyoka Cowden feels her DM control has improved enough for her to undergo breast surgery.   If pt's CBG and PT acceptable DOS, I anticipate pt can proceed as scheduled.   Willeen Cass, FNP-BC [facility] Short Stay Surgical Center/Anesthesiology Phone: [phone] 10/30/2014 4:09 PM

## 2014-11-04 ENCOUNTER — Ambulatory Visit
Admission: RE | Admit: 2014-11-04 | Discharge: 2014-11-04 | Disposition: A | Discharge status: Home / Self Care | Payer: Medicare PPO | Source: Ambulatory Visit | Attending: Surgery | Admitting: Surgery

## 2014-11-04 MED ORDER — DEXTROSE 5 % IV SOLN
3.0000 g | INTRAVENOUS | Status: AC
  Administered 2014-11-05: 3 g via INTRAVENOUS
  Filled 2014-11-04: qty 3000

## 2014-11-04 NOTE — H&P (Signed)
[name]  Location: [facility] Patient #: [id number] DOB: 10-30-1934 Widowed / Language: [place] / Race: Black or African American Female  History of Present Illness  Patient words: breast.  The patient is a 80 year old female who presents with a breast mass. This patient is referred by Dr. Harrington Challenger. She recently has had mammograms demonstrating abnormalities in the left breast over a large area. There are dilated ducts and areas consistent with possible papillomas. She has had an ultrasound-guided biopsy which showed dilated ducts and breast parenchyma but no evidence of malignancy. She reports nipple discharge. Radiology is worried that malignancy is being missed so she is being referred for consideration of an open incisional biopsy of the abnormal area. She reports bilateral breast pain. She has no previous history of breast cancer. She has multiple chronic medical problems and reports chronic shortness of breath. She has also been told in the past that she is high risk for surgery. She did have a recent cardiac catheter which showed mild coronary disease   Other Problems Elbert Ewings, CMA Anxiety Disorder Arthritis Congestive Heart Failure Depression Diabetes Mellitus Gastroesophageal Reflux Disease High blood pressure Hypercholesterolemia Lump In Breast Myocardial infarction Sleep Apnea  Past Surgical History Elbert Ewings, CMA Breast Biopsy Left. Cataract Surgery Bilateral. Cesarean Section - 1 Hysterectomy (not due to cancer) - Partial Resection of Stomach  Diagnostic Studies History Elbert Ewings, CMA; ) Colonoscopy 1-5 years ago Mammogram within last year Pap Smear 1-5 years ago  Allergies Elbert Ewings, CMA;  Sulfacetamide-Sulfur in Urea *DERMATOLOGICALS*  Medication History Elbert Ewings, CMA;  Oxycodone-Acetaminophen (10-325MG  Tablet, Oral) Active. ClonazePAM (1MG  Tablet, Oral) Active. Cefuroxime Axetil (500MG   Tablet, Oral) Active. DULoxetine HCl (60MG  Capsule DR Part, Oral) Active. Colchicine (0.6MG  Tablet, Oral) Active. Fluconazole (150MG  Tablet, Oral) Active. FreeStyle Lite Test (In Vitro) Active. Furosemide (20MG  Tablet, Oral) Active. HumaLOG KwikPen (100UNIT/ML Soln Pen-inj, Subcutaneous) Active. HumaLOG KwikPen (200UNIT/ML Soln Pen-inj, Subcutaneous) Active. Isosorbide Mononitrate ER (60MG  Tablet ER 24HR, Oral) Active. Metoprolol Succinate ER (25MG  Tablet ER 24HR, Oral) Active. Pantoprazole Sodium (40MG  Tablet DR, Oral) Active. Proventil HFA (108 (90 Base)MCG/ACT Aerosol Soln, Inhalation) Active. Pravastatin Sodium (20MG  Tablet, Oral) Active. Toujeo SoloStar (300UNIT/ML Soln Pen-inj, Subcutaneous) Active. Warfarin Sodium (5MG  Tablet, Oral) Active. Medications Reconciled  Social History Elbert Ewings, [place];  Caffeine use Coffee, Tea. No alcohol use No drug use Tobacco use Never smoker.  Family History Elbert Ewings, [place];  Arthritis Daughter, Father, Son. Breast Cancer Mother. Diabetes Mellitus Father. Heart Disease Father.  Pregnancy / Birth History Elbert Ewings, [place];  Age at menarche 58 years. Gravida 3 Irregular periods Maternal age 32-20 Para 3  Review of Systems Elbert Ewings CMA; General Present- Weight Gain. Not Present- Appetite Loss, Chills, Fatigue, Fever, Night Sweats and Weight Loss. HEENT Present- Seasonal Allergies. Not Present- Earache, Hearing Loss, Hoarseness, Nose Bleed, Oral Ulcers, Ringing in the Ears, Sinus Pain, Sore Throat, Visual Disturbances, Wears glasses/contact lenses and Yellow Eyes. Respiratory Present- Snoring and Wheezing. Not Present- Bloody sputum, Chronic Cough and Difficulty Breathing. Breast Present- Breast Mass, Breast Pain and Nipple Discharge. Not Present- Skin Changes. Cardiovascular Present- Leg Cramps and Shortness of Breath. Not Present- Chest Pain, Difficulty Breathing Lying Down, Palpitations, Rapid Heart  Rate and Swelling of Extremities. Gastrointestinal Present- Abdominal Pain and Constipation. Not Present- Bloating, Bloody Stool, Change in Bowel Habits, Chronic diarrhea, Difficulty Swallowing, Excessive gas, Gets full quickly at meals, Hemorrhoids, Indigestion, Nausea, Rectal Pain and Vomiting. Musculoskeletal Present- Joint Stiffness and Swelling of Extremities. Not Present-  Back Pain, Joint Pain, Muscle Pain and Muscle Weakness. Neurological Present- Trouble walking and Weakness. Not Present- Decreased Memory, Fainting, Headaches, Numbness, Seizures, Tingling and Tremor. Psychiatric Present- Anxiety and Depression. Not Present- Bipolar, Change in Sleep Pattern, Fearful and Frequent crying. Hematology Present- Easy Bruising. Not Present- Excessive bleeding, Gland problems, HIV and Persistent Infections.   Vitals Elbert Ewings CMA;  08/10/2014 3:39 PM Weight: 359 lb Height: 67in Body Surface Area: 2.77 m Body Mass Index: 56.23 kg/m Temp.: 97.52F(Temporal)  Pulse: 78 (Regular)  Resp.: 15 (Unlabored)  BP: 138/70 (Sitting, Left Arm, Standard)    Physical Exam (Veer Elamin A. Ninfa Linden MD;  General Mental Status-Alert. General Appearance-Consistent with stated age. Hydration-Well hydrated. Voice-Normal. Note: Morbidly obese in a wheelchair   Head and Neck - Did not examine.  Eye Eyeball - Bilateral-Extraocular movements intact. Sclera/Conjunctiva - Bilateral-No scleral icterus.  ENMT - Did not examine.  Chest and Lung Exam Chest and lung exam reveals -quiet, even and easy respiratory effort with no use of accessory muscles and on auscultation, normal breath sounds, no adventitious sounds and normal vocal resonance. Inspection Chest Wall - Normal. Back - normal.  Breast Breast - Left-Symmetric, Non Tender, No Biopsy scars, no Dimpling, No Inflammation, No Lumpectomy scars, No Mastectomy scars, No Peau d' Orange. Breast - Right-Symmetric, Non Tender,  No Biopsy scars, no Dimpling, No Inflammation, No Lumpectomy scars, No Mastectomy scars, No Peau d' Orange. Breast Lump-No Palpable Breast Mass.  Cardiovascular Cardiovascular examination reveals -normal heart sounds, regular rate and rhythm with no murmurs and normal pedal pulses bilaterally.  Abdomen Inspection Inspection of the abdomen reveals - No Hernias. Skin - Scar - no surgical scars. Palpation/Percussion Palpation and Percussion of the abdomen reveal - Soft, Non Tender, No Rebound tenderness, No Rigidity (guarding) and No hepatosplenomegaly. Auscultation Auscultation of the abdomen reveals - Bowel sounds normal.  Neurologic - Did not examine.  Musculoskeletal Normal Exam - Left-Upper Extremity Strength Normal and Lower Extremity Strength Normal. Normal Exam - Right-Upper Extremity Strength Normal and Lower Extremity Strength Normal.  Lymphatic Head & Neck  General Head & Neck Lymphatics: Bilateral - Description - Normal. Axillary  General Axillary Region: Bilateral - Description - Normal. Tenderness - Non Tender. Femoral & Inguinal  Generalized Femoral & Inguinal Lymphatics: Bilateral - Description - Normal. Tenderness - Non Tender.    Assessment & Plan (Timm Bonenberger A. Ninfa Linden MD;  \ LEFT BREAST MASS [phone]  [id number])  Impression: I discussed this with the patient and her daughter. There is a very large abnormal area in the breast. This may just represent papillomas and dilated ducts. Nonetheless, incisional biopsy is recommended to see if there is malignancy present. I discussed a seed localized left breast lumpectomy with patient. She would need to come off her Coumadin for approximately 3 days. I discussed the risk of surgery. She would also need cardiac clearance. We will refer her to the cardiologist and what she has been cleared we'll then schedule a left breast lumpectomy hopefully under minimal anesthesia Current Plans  Patient or caregiver to follow up by  phone with update next week

## 2014-11-05 ENCOUNTER — Ambulatory Visit (HOSPITAL_COMMUNITY)
Admission: RE | Admit: 2014-11-05 | Discharge: 2014-11-05 | Disposition: A | Discharge status: Home / Self Care | Payer: Medicare PPO | Source: Ambulatory Visit | Attending: Surgery | Admitting: Surgery

## 2014-11-05 ENCOUNTER — Ambulatory Visit (HOSPITAL_COMMUNITY): Payer: Medicare PPO | Admitting: Emergency Medicine

## 2014-11-05 ENCOUNTER — Ambulatory Visit
Admission: RE | Admit: 2014-11-05 | Discharge: 2014-11-05 | Disposition: A | Discharge status: Home / Self Care | Payer: Medicare PPO | Source: Ambulatory Visit | Attending: Surgery | Admitting: Surgery

## 2014-11-05 ENCOUNTER — Encounter (HOSPITAL_COMMUNITY)
Admission: RE | Disposition: A | Discharge status: Home / Self Care | Payer: Self-pay | Source: Ambulatory Visit | Attending: Surgery

## 2014-11-05 ENCOUNTER — Encounter (HOSPITAL_COMMUNITY): Payer: Self-pay | Admitting: Certified Registered Nurse Anesthetist

## 2014-11-05 ENCOUNTER — Ambulatory Visit (HOSPITAL_COMMUNITY): Payer: Medicare PPO | Admitting: Certified Registered"

## 2014-11-05 DIAGNOSIS — N644 Mastodynia: Secondary | ICD-10-CM | POA: Insufficient documentation

## 2014-11-05 DIAGNOSIS — G4730 Sleep apnea, unspecified: Secondary | ICD-10-CM | POA: Diagnosis not present

## 2014-11-05 DIAGNOSIS — Z79899 Other long term (current) drug therapy: Secondary | ICD-10-CM | POA: Insufficient documentation

## 2014-11-05 DIAGNOSIS — Z803 Family history of malignant neoplasm of breast: Secondary | ICD-10-CM | POA: Insufficient documentation

## 2014-11-05 DIAGNOSIS — N63 Unspecified lump in breast: Secondary | ICD-10-CM | POA: Diagnosis not present

## 2014-11-05 DIAGNOSIS — Z9841 Cataract extraction status, right eye: Secondary | ICD-10-CM | POA: Diagnosis not present

## 2014-11-05 DIAGNOSIS — Z7901 Long term (current) use of anticoagulants: Secondary | ICD-10-CM | POA: Diagnosis not present

## 2014-11-05 DIAGNOSIS — L918 Other hypertrophic disorders of the skin: Secondary | ICD-10-CM | POA: Diagnosis not present

## 2014-11-05 DIAGNOSIS — I252 Old myocardial infarction: Secondary | ICD-10-CM | POA: Insufficient documentation

## 2014-11-05 DIAGNOSIS — K219 Gastro-esophageal reflux disease without esophagitis: Secondary | ICD-10-CM | POA: Insufficient documentation

## 2014-11-05 DIAGNOSIS — F329 Major depressive disorder, single episode, unspecified: Secondary | ICD-10-CM | POA: Diagnosis not present

## 2014-11-05 DIAGNOSIS — Z90710 Acquired absence of both cervix and uterus: Secondary | ICD-10-CM | POA: Insufficient documentation

## 2014-11-05 DIAGNOSIS — D242 Benign neoplasm of left breast: Secondary | ICD-10-CM

## 2014-11-05 DIAGNOSIS — M1990 Unspecified osteoarthritis, unspecified site: Secondary | ICD-10-CM | POA: Diagnosis not present

## 2014-11-05 DIAGNOSIS — F419 Anxiety disorder, unspecified: Secondary | ICD-10-CM | POA: Diagnosis not present

## 2014-11-05 DIAGNOSIS — E119 Type 2 diabetes mellitus without complications: Secondary | ICD-10-CM | POA: Diagnosis not present

## 2014-11-05 DIAGNOSIS — Z9842 Cataract extraction status, left eye: Secondary | ICD-10-CM | POA: Insufficient documentation

## 2014-11-05 DIAGNOSIS — Z79891 Long term (current) use of opiate analgesic: Secondary | ICD-10-CM | POA: Diagnosis not present

## 2014-11-05 DIAGNOSIS — Z6841 Body Mass Index (BMI) 40.0 and over, adult: Secondary | ICD-10-CM | POA: Diagnosis not present

## 2014-11-05 DIAGNOSIS — N6042 Mammary duct ectasia of left breast: Secondary | ICD-10-CM | POA: Insufficient documentation

## 2014-11-05 DIAGNOSIS — E780 Pure hypercholesterolemia: Secondary | ICD-10-CM | POA: Diagnosis not present

## 2014-11-05 DIAGNOSIS — K5900 Constipation, unspecified: Secondary | ICD-10-CM | POA: Insufficient documentation

## 2014-11-05 DIAGNOSIS — Z794 Long term (current) use of insulin: Secondary | ICD-10-CM | POA: Diagnosis not present

## 2014-11-05 DIAGNOSIS — N6320 Unspecified lump in the left breast, unspecified quadrant: Secondary | ICD-10-CM

## 2014-11-05 DIAGNOSIS — I509 Heart failure, unspecified: Secondary | ICD-10-CM | POA: Diagnosis not present

## 2014-11-05 HISTORY — PX: BREAST LUMPECTOMY WITH RADIOACTIVE SEED LOCALIZATION: SHX6424

## 2014-11-05 HISTORY — PX: BREAST LUMPECTOMY: SHX2

## 2014-11-05 HISTORY — PX: EXCISION OF SKIN TAG: SHX6270

## 2014-11-05 LAB — PROTIME-INR
INR: 1.13 (ref 0.00–1.49)
Prothrombin Time: 14.7 seconds (ref 11.6–15.2)

## 2014-11-05 LAB — GLUCOSE, CAPILLARY
Glucose-Capillary: 377 mg/dL — ABNORMAL HIGH (ref 65–99)
Glucose-Capillary: 369 mg/dL — ABNORMAL HIGH (ref 65–99)
Glucose-Capillary: 284 mg/dL — ABNORMAL HIGH (ref 65–99)

## 2014-11-05 SURGERY — BREAST LUMPECTOMY WITH RADIOACTIVE SEED LOCALIZATION
Anesthesia: General | Site: Eye | Laterality: Right

## 2014-11-05 MED ORDER — OXYCODONE HCL 5 MG/5ML PO SOLN: 5.0000 mg | Freq: Once | ORAL | Status: AC | PRN

## 2014-11-05 MED ORDER — BUPIVACAINE-EPINEPHRINE (PF) 0.25% -1:200000 IJ SOLN
INTRAMUSCULAR | Status: AC
  Filled 2014-11-05: qty 30

## 2014-11-05 MED ORDER — BUPIVACAINE-EPINEPHRINE 0.25% -1:200000 IJ SOLN
INTRAMUSCULAR | Status: DC | PRN
Start: 2014-11-05 — End: 2014-11-05
  Administered 2014-11-05: 20 mL

## 2014-11-05 MED ORDER — INSULIN ASPART 100 UNIT/ML ~~LOC~~ SOLN
SUBCUTANEOUS | Status: AC
  Administered 2014-11-05: 10 [IU]
  Filled 2014-11-05: qty 1

## 2014-11-05 MED ORDER — OXYCODONE HCL 5 MG PO TABS
ORAL_TABLET | ORAL | Status: DC
Start: 2014-11-05 — End: 2014-11-05
  Filled 2014-11-05: qty 1

## 2014-11-05 MED ORDER — LIDOCAINE HCL (CARDIAC) 20 MG/ML IV SOLN
INTRAVENOUS | Status: DC | PRN
  Administered 2014-11-05: 80 mg via INTRAVENOUS

## 2014-11-05 MED ORDER — ACETAMINOPHEN 160 MG/5ML PO SOLN
325.0000 mg | ORAL | Status: DC | PRN
  Filled 2014-11-05: qty 20.3

## 2014-11-05 MED ORDER — FENTANYL CITRATE (PF) 250 MCG/5ML IJ SOLN
INTRAMUSCULAR | Status: AC
  Filled 2014-11-05: qty 5

## 2014-11-05 MED ORDER — FENTANYL CITRATE (PF) 100 MCG/2ML IJ SOLN: 25.0000 ug | INTRAMUSCULAR | Status: DC | PRN

## 2014-11-05 MED ORDER — METOPROLOL SUCCINATE ER 25 MG PO TB24
25.0000 mg | ORAL_TABLET | Freq: Once | ORAL | Status: AC
  Administered 2014-11-05: 25 mg via ORAL
  Filled 2014-11-05: qty 1

## 2014-11-05 MED ORDER — ONDANSETRON HCL 4 MG/2ML IJ SOLN
INTRAMUSCULAR | Status: DC | PRN
  Administered 2014-11-05: 4 mg via INTRAVENOUS

## 2014-11-05 MED ORDER — MIDAZOLAM HCL 5 MG/5ML IJ SOLN
INTRAMUSCULAR | Status: DC | PRN
  Administered 2014-11-05: 2 mg via INTRAVENOUS

## 2014-11-05 MED ORDER — PROPOFOL 10 MG/ML IV BOLUS
INTRAVENOUS | Status: DC | PRN
  Administered 2014-11-05: 160 mg via INTRAVENOUS

## 2014-11-05 MED ORDER — INSULIN ASPART 100 UNIT/ML ~~LOC~~ SOLN
SUBCUTANEOUS | Status: AC
  Administered 2014-11-05: 10 [IU] via SUBCUTANEOUS
  Filled 2014-11-05: qty 1

## 2014-11-05 MED ORDER — MIDAZOLAM HCL 2 MG/2ML IJ SOLN
INTRAMUSCULAR | Status: AC
  Filled 2014-11-05: qty 2

## 2014-11-05 MED ORDER — INSULIN ASPART 100 UNIT/ML ~~LOC~~ SOLN
10.0000 [IU] | SUBCUTANEOUS | Status: AC
  Administered 2014-11-05: 10 [IU] via SUBCUTANEOUS

## 2014-11-05 MED ORDER — PHENYLEPHRINE 40 MCG/ML (10ML) SYRINGE FOR IV PUSH (FOR BLOOD PRESSURE SUPPORT)
PREFILLED_SYRINGE | INTRAVENOUS | Status: AC
  Filled 2014-11-05: qty 10

## 2014-11-05 MED ORDER — LIDOCAINE HCL (CARDIAC) 20 MG/ML IV SOLN
INTRAVENOUS | Status: AC
  Filled 2014-11-05: qty 5

## 2014-11-05 MED ORDER — 0.9 % SODIUM CHLORIDE (POUR BTL) OPTIME
TOPICAL | Status: DC | PRN
  Administered 2014-11-05: 1000 mL

## 2014-11-05 MED ORDER — SUCCINYLCHOLINE CHLORIDE 20 MG/ML IJ SOLN
INTRAMUSCULAR | Status: DC | PRN
  Administered 2014-11-05: 60 mg via INTRAVENOUS

## 2014-11-05 MED ORDER — LACTATED RINGERS IV SOLN
INTRAVENOUS | Status: DC | PRN
  Administered 2014-11-05: 07:00:00 via INTRAVENOUS

## 2014-11-05 MED ORDER — INSULIN ASPART 100 UNIT/ML ~~LOC~~ SOLN
10.0000 [IU] | Freq: Once | SUBCUTANEOUS | Status: AC
  Administered 2014-11-05: 10 [IU] via SUBCUTANEOUS

## 2014-11-05 MED ORDER — OXYCODONE HCL 5 MG PO TABS
5.0000 mg | ORAL_TABLET | Freq: Once | ORAL | Status: AC | PRN
  Administered 2014-11-05: 5 mg via ORAL

## 2014-11-05 MED ORDER — ARTIFICIAL TEARS OP OINT
TOPICAL_OINTMENT | OPHTHALMIC | Status: DC | PRN
  Administered 2014-11-05: 1 via OPHTHALMIC

## 2014-11-05 MED ORDER — HYDROCODONE-ACETAMINOPHEN 5-325 MG PO TABS
1.0000 | ORAL_TABLET | Freq: Four times a day (QID) | ORAL | Status: DC | PRN
Start: 2014-11-05 — End: 2014-12-16

## 2014-11-05 MED ORDER — ACETAMINOPHEN 325 MG PO TABS: 325.0000 mg | ORAL_TABLET | ORAL | Status: DC | PRN

## 2014-11-05 MED ORDER — PROPOFOL 10 MG/ML IV BOLUS
INTRAVENOUS | Status: AC
  Filled 2014-11-05: qty 20

## 2014-11-05 MED ORDER — ARTIFICIAL TEARS OP OINT
TOPICAL_OINTMENT | OPHTHALMIC | Status: AC
  Filled 2014-11-05: qty 3.5

## 2014-11-05 MED ORDER — FENTANYL CITRATE (PF) 100 MCG/2ML IJ SOLN
INTRAMUSCULAR | Status: DC | PRN
  Administered 2014-11-05 (×3): 50 ug via INTRAVENOUS

## 2014-11-05 MED ORDER — METOPROLOL SUCCINATE ER 25 MG PO TB24: 25.0000 mg | ORAL_TABLET | Freq: Every day | ORAL | Status: DC

## 2014-11-05 MED ORDER — PHENYLEPHRINE HCL 10 MG/ML IJ SOLN
INTRAMUSCULAR | Status: DC | PRN
  Administered 2014-11-05: 80 ug via INTRAVENOUS
  Administered 2014-11-05: 120 ug via INTRAVENOUS
  Administered 2014-11-05: 80 ug via INTRAVENOUS

## 2014-11-05 SURGICAL SUPPLY — 40 items
APPLIER CLIP 9.375 MED OPEN (MISCELLANEOUS) ×4
APR CLP MED 9.3 20 MLT OPN (MISCELLANEOUS) ×2
BINDER BREAST LRG (GAUZE/BANDAGES/DRESSINGS) IMPLANT
BINDER BREAST XLRG (GAUZE/BANDAGES/DRESSINGS) IMPLANT
BLADE SURG 15 STRL LF DISP TIS (BLADE) ×2 IMPLANT
BLADE SURG 15 STRL SS (BLADE) ×4
CANISTER SUCTION 2500CC (MISCELLANEOUS) IMPLANT
CHLORAPREP W/TINT 26ML (MISCELLANEOUS) ×4 IMPLANT
CLIP APPLIE 9.375 MED OPEN (MISCELLANEOUS) ×2 IMPLANT
COVER PROBE W GEL 5X96 (DRAPES) ×4 IMPLANT
COVER SURGICAL LIGHT HANDLE (MISCELLANEOUS) ×4 IMPLANT
DEVICE DUBIN SPECIMEN MAMMOGRA (MISCELLANEOUS) ×4 IMPLANT
DRAPE CHEST BREAST 15X10 FENES (DRAPES) ×4 IMPLANT
DRAPE UTILITY W/TAPE 26X15 (DRAPES) ×4 IMPLANT
ELECT CAUTERY BLADE 6.4 (BLADE) ×4 IMPLANT
ELECT REM PT RETURN 9FT ADLT (ELECTROSURGICAL) ×4
ELECTRODE REM PT RTRN 9FT ADLT (ELECTROSURGICAL) ×2 IMPLANT
GLOVE SURG SIGNA 7.5 PF LTX (GLOVE) ×4 IMPLANT
GOWN STRL REUS W/ TWL LRG LVL3 (GOWN DISPOSABLE) ×4 IMPLANT
GOWN STRL REUS W/ TWL XL LVL3 (GOWN DISPOSABLE) ×2 IMPLANT
GOWN STRL REUS W/TWL LRG LVL3 (GOWN DISPOSABLE) ×6
GOWN STRL REUS W/TWL XL LVL3 (GOWN DISPOSABLE) ×3
KIT BASIN OR (CUSTOM PROCEDURE TRAY) ×4 IMPLANT
KIT MARKER MARGIN INK (KITS) ×4 IMPLANT
LIQUID BAND (GAUZE/BANDAGES/DRESSINGS) ×8 IMPLANT
NEEDLE HYPO 25X1 1.5 SAFETY (NEEDLE) ×4 IMPLANT
NS IRRIG 1000ML POUR BTL (IV SOLUTION) IMPLANT
PACK SURGICAL SETUP 50X90 (CUSTOM PROCEDURE TRAY) ×4 IMPLANT
PENCIL BUTTON HOLSTER BLD 10FT (ELECTRODE) ×4 IMPLANT
SPONGE LAP 18X18 X RAY DECT (DISPOSABLE) ×4 IMPLANT
SUT MNCRL AB 4-0 PS2 18 (SUTURE) ×4 IMPLANT
SUT SILK 2 0 SH (SUTURE) IMPLANT
SUT VIC AB 3-0 SH 18 (SUTURE) ×4 IMPLANT
SYR BULB 3OZ (MISCELLANEOUS) ×4 IMPLANT
SYR CONTROL 10ML LL (SYRINGE) ×4 IMPLANT
TOWEL OR 17X24 6PK STRL BLUE (TOWEL DISPOSABLE) ×4 IMPLANT
TOWEL OR 17X26 10 PK STRL BLUE (TOWEL DISPOSABLE) ×4 IMPLANT
TUBE CONNECTING 12'X1/4 (SUCTIONS)
TUBE CONNECTING 12X1/4 (SUCTIONS) IMPLANT
YANKAUER SUCT BULB TIP NO VENT (SUCTIONS) IMPLANT

## 2014-11-05 NOTE — Discharge Instructions (Signed)
[place] Office Phone Number [phone]  BREAST BIOPSY/ PARTIAL MASTECTOMY: POST OP INSTRUCTIONS  Always review your discharge instruction sheet given to you by the facility where your surgery was performed.  IF YOU HAVE DISABILITY OR FAMILY LEAVE FORMS, YOU MUST BRING THEM TO THE OFFICE FOR PROCESSING.  DO NOT GIVE THEM TO YOUR DOCTOR.  1. A prescription for pain medication may be given to you upon discharge.  Take your pain medication as prescribed, if needed.  If narcotic pain medicine is not needed, then you may take acetaminophen (Tylenol) or ibuprofen (Advil) as needed. 2. Take your usually prescribed medications unless otherwise directed 3. If you need a refill on your pain medication, please contact your pharmacy.  They will contact our office to request authorization.  Prescriptions will not be filled after 5pm or on week-ends. 4. You should eat very light the first 24 hours after surgery, such as soup, crackers, pudding, etc.  Resume your normal diet the day after surgery. 5. Most patients will experience some swelling and bruising in the breast.  Ice packs and a good support bra will help.  Swelling and bruising can take several days to resolve.  6. It is common to experience some constipation if taking pain medication after surgery.  Increasing fluid intake and taking a stool softener will usually help or prevent this problem from occurring.  A mild laxative (Milk of Magnesia or Miralax) should be taken according to package directions if there are no bowel movements after 48 hours. 7. Unless discharge instructions indicate otherwise, you may remove your bandages 24-48 hours after surgery, and you may shower at that time.  You may have steri-strips (small skin tapes) in place directly over the incision.  These strips should be left on the skin for 7-10 days.  If your surgeon used skin glue on the incision, you may shower in 24 hours.  The glue will flake off over the  next 2-3 weeks.  Any sutures or staples will be removed at the office during your follow-up visit. 8. ACTIVITIES:  You may resume regular daily activities (gradually increasing) beginning the next day.  Wearing a good support bra or sports bra minimizes pain and swelling.  You may have sexual intercourse when it is comfortable. a. You may drive when you no longer are taking prescription pain medication, you can comfortably wear a seatbelt, and you can safely maneuver your car and apply brakes. b. RETURN TO WORK:  ______________________________________________________________________________________ 9. You should see your doctor in the office for a follow-up appointment approximately two weeks after your surgery.  Your doctors nurse will typically make your follow-up appointment when she calls you with your pathology report.  Expect your pathology report 2-3 business days after your surgery.  You may call to check if you do not hear from [place] after three days. 10. OTHER INSTRUCTIONS: 11. OK TO SHOWER STARTING TOMORROW _______________________________________________________________________________________________ _____________________________________________________________________________________________________________________________________ _____________________________________________________________________________________________________________________________________ _____________________________________________________________________________________________________________________________________  WHEN TO CALL YOUR DOCTOR: 1. Fever over 101.0 2. Nausea and/or vomiting. 3. Extreme swelling or bruising. 4. Continued bleeding from incision. 5. Increased pain, redness, or drainage from the incision.  The clinic staff is available to answer your questions during regular business hours.  Please dont hesitate to call and ask to speak to one of the nurses for clinical concerns.  If you have a  medical emergency, go to the nearest emergency room or call 911.  A surgeon from [facility] is always on call at the hospital.  For  further questions, please visit [email or web address]  °

## 2014-11-05 NOTE — Anesthesia Procedure Notes (Signed)
Procedure Name: Intubation Date/Time: 11/05/2014 7:36 AM Performed by: Rogers Blocker Pre-anesthesia Checklist: Patient identified, Timeout performed, Emergency Drugs available, Suction available and Patient being monitored Patient Re-evaluated:Patient Re-evaluated prior to inductionOxygen Delivery Method: Circle system utilized Preoxygenation: Pre-oxygenation with 100% oxygen Intubation Type: IV induction Ventilation: Mask ventilation without difficulty and Oral airway inserted - appropriate to patient size Laryngoscope Size: Mac and 4 Grade View: Grade I Tube type: Oral Tube size: 7.5 mm Number of attempts: 1 Airway Equipment and Method: Patient positioned with wedge pillow and Stylet Placement Confirmation: ETT inserted through vocal cords under direct vision,  breath sounds checked- equal and bilateral,  positive ETCO2 and CO2 detector Secured at: 21 cm Tube secured with: Tape Dental Injury: Teeth and Oropharynx as per pre-operative assessment

## 2014-11-05 NOTE — Op Note (Signed)
NAMEDELVINA, WEAKS               ACCOUNT NO.:  [id number]  MEDICAL RECORD NO.:  [id number]  LOCATION:  [facility]                         FACILITY:  [facility]  PHYSICIAN:  [name], M.D. DATE OF BIRTH:  07-24-1934  DATE OF PROCEDURE:  11/05/2014 DATE OF DISCHARGE:                              OPERATIVE REPORT   PREOPERATIVE DIAGNOSES: 1. Left breast suspicious lesion. 2. Right eyelid skin tag.  POSTOPERATIVE DIAGNOSES: 1. Left breast suspicious lesion. 2. Right eyelid skin tag.  PROCEDURES: 1. Radioactive seed localized left breast lumpectomy. 2. Excision of skin tag, right eyelid.  SURGEON:  [name], M.D.  ANESTHESIA:  General and 0.25% Marcaine.  ESTIMATED BLOOD LOSS:  Minimal.  INDICATIONS:  This is a 80 year old female who has had an abnormality on left breast on mammography over a wide area, which is retroareolar.  She has had 2 separate stereotactic biopsies of this area.  The biopsies have shown dilated ducts with breast parenchyma and no specific diagnosis other than the potential papilloma.  The mammographic findings however are worrisome for malignancy, therefore, decision had been made to proceed with a radioactive seed localized left breast lumpectomy.  FINDINGS:  The radioactive seed was found to be in the lumpectomy specimen and confirmed on x-ray and by Pathology.  PROCEDURE IN DETAIL:  The patient was identified in the holding area and identified as [name].  The Neoprobe showed that the radioactive seed was in the breast.  She was then taken to the operating room, placed on the operating room table and general anesthesia was induced. Her left breast was then prepped and draped in usual sterile fashion.  I again brought the Neoprobe on the field and identified an area of increased uptake in the retroareolar area of the breast.  I then made a circumareolar incision in the upper inner quadrant at the areola with a scalpel.  I took this down  the breast tissue with electrocautery.  I then performed a wide lumpectomy with the aid of Neoprobe going around the radioactive seed and into this hard dense breast tissue with a lot of dilated ducts.  Once I widely excised this area with the cautery, I then completely removed and confirmed that the seed was in the specimen, and there was no increased uptake in the breast.  I then marked the specimen with marker paint and x-rayed it.  The radioactive seed and one of the previous biopsy markers were in the clips as well as a lot of the architectural distortion, which was visible on mammography.  This was then sent to Pathology for evaluation.  I then anesthetized the wound with Marcaine.  I achieved hemostasis with cautery.  I placed surgical clips in the area.  I then closed the incision with interrupted 3-0 Vicryl sutures and a running 4-0 Monocryl.  I had undermined the skin slightly and made a small tear underneath the nipple, which I repaired also with Monocryl suture.  Skin glue was then applied.  Next, we prepped the little area on her upper right eyelid with Betadine, addressed the small skin tag and excised it with the cautery.  I then closed the small wound with Dermabond.  The  patient tolerated the procedure well.  All the counts were correct at the end of procedure. The patient was then extubated in the operating room and taken in a stable condition to the recovery room.     [name], M.D.     DB/MEDQ  D:  11/05/2014  T:  11/05/2014  Job:  [id number]

## 2014-11-05 NOTE — Anesthesia Preprocedure Evaluation (Signed)
Anesthesia Evaluation  Patient identified by MRN, date of birth, ID band Patient awake    Reviewed: Allergy & Precautions, NPO status , Patient's Chart, lab work & pertinent test results  History of Anesthesia Complications Negative for: history of anesthetic complications  Airway Mallampati: II  TM Distance: >3 FB Neck ROM: Full    Dental  (+) Partial Lower, Upper Dentures   Pulmonary shortness of breath and with exertion, sleep apnea ,  COPD inhaler, neg recent URI,  breath sounds clear to auscultation        Cardiovascular hypertension, Pt. on medications and Pt. on home beta blockers + CAD, + Past MI, +CHF and + DOE Rhythm:Regular     Neuro/Psych  Headaches, PSYCHIATRIC DISORDERS Anxiety Depression    GI/Hepatic Neg liver ROS, GERD-  Controlled,  Endo/Other  diabetes, Poorly Controlled, Type 2, Insulin DependentMorbid obesity  Renal/GU Renal Insufficiency     Musculoskeletal  (+) Arthritis -,   Abdominal   Peds  Hematology  (+) anemia ,   Anesthesia Other Findings   Reproductive/Obstetrics                             Anesthesia Physical Anesthesia Plan  ASA: III  Anesthesia Plan: General   Post-op Pain Management:    Induction: Intravenous  Airway Management Planned: LMA and Oral ETT  Additional Equipment: None  Intra-op Plan:   Post-operative Plan: Extubation in OR  Informed Consent: I have reviewed the patients History and Physical, chart, labs and discussed the procedure including the risks, benefits and alternatives for the proposed anesthesia with the patient or authorized representative who has indicated his/her understanding and acceptance.   Dental advisory given  Plan Discussed with: Surgeon and CRNA  Anesthesia Plan Comments:         Anesthesia Quick Evaluation

## 2014-11-05 NOTE — Progress Notes (Signed)
Spoke with Dr Percell Miller re elevated blood sugar of 377.  Pt is tired only (states from lack of sleep).  Dr Oletta Lamas states she will have Dr Ermalene Postin call me when he arrives.

## 2014-11-05 NOTE — Interval H&P Note (Signed)
History and Physical Interval Note:pt wants small mole removed from right eye lid so will add to the consent.  Risks discussed.  Despite high blood glucose, need to proceed with surgery given seed in place.  11/05/2014 7:07 AM  [name]  has presented today for surgery, with the diagnosis of Left Breast Mass  The various methods of treatment have been discussed with the patient and family. After consideration of risks, benefits and other options for treatment, the patient has consented to  Procedure(s): LEFT BREAST LUMPECTOMY WITH RADIOACTIVE SEED LOCALIZATION (Left) as a surgical intervention .  [name] history has been reviewed, patient examined, no change in status, stable for surgery.  I have reviewed the patient's chart and labs.  Questions were answered to the patient's satisfaction.     Arantza Darrington A

## 2014-11-05 NOTE — Transfer of Care (Signed)
Immediate Anesthesia Transfer of Care Note  Patient: [name]  Procedure(s) Performed: Procedure(s): LEFT BREAST LUMPECTOMY WITH RADIOACTIVE SEED LOCALIZATION (Left) EXCISION OF RIGHT EYELID SKIN TAG (Right)  Patient Location: PACU  Anesthesia Type:General  Level of Consciousness: awake, alert , oriented and patient cooperative  Airway & Oxygen Therapy: Patient Spontanous Breathing and Patient connected to face mask oxygen  Post-op Assessment: Report given to RN, Post -op Vital signs reviewed and stable and Patient moving all extremities X 4  Post vital signs: Reviewed and stable  Last Vitals:  Filed Vitals:   11/05/14 0607  BP: 180/71  Pulse: 89  Temp: 36.6 C  Resp: 20    Complications: No apparent anesthesia complications

## 2014-11-05 NOTE — Progress Notes (Signed)
Late entry: Dr. Ermalene Postin notified of cbg of 369. Order given to give Regular insulin 10 units SQ., and her long acting dose. Daughter at bedside and clarified that patient gets toujeo 45units sq in the morning and in the evening. Dr. Ermalene Postin said ok to give 45 units of toujeo per home dose.

## 2014-11-05 NOTE — Op Note (Signed)
LEFT BREAST LUMPECTOMY WITH RADIOACTIVE SEED LOCALIZATION, EXCISION OF RIGHT EYELID SKIN TAG  Procedure Note  SHYLI WOOD 11/05/2014   Pre-op Diagnosis: Left Breast Mass, skin tag right eyelid     Post-op Diagnosis: same  Procedure(s): LEFT BREAST LUMPECTOMY WITH RADIOACTIVE SEED LOCALIZATION EXCISION OF RIGHT EYELID SKIN TAG  Surgeon(s): [name], MD  Anesthesia: General  Staff:  Circulator: Cyd Silence, RN Scrub Person: Jesse Sans, CST; Rushdan Charm Barges, RN Circulator Assistant: Theora Master Sipsis, RN  Estimated Blood Loss: Minimal               Specimens: sent to path          [facility]   Date: 11/05/2014  Time: 8:14 AM

## 2014-11-06 ENCOUNTER — Encounter (HOSPITAL_COMMUNITY): Payer: Self-pay | Admitting: Surgery

## 2014-11-06 NOTE — Anesthesia Postprocedure Evaluation (Signed)
  Anesthesia Post-op Note  Patient: [name]  Procedure(s) Performed: Procedure(s): LEFT BREAST LUMPECTOMY WITH RADIOACTIVE SEED LOCALIZATION (Left) EXCISION OF RIGHT EYELID SKIN TAG (Right)  Patient Location: PACU  Anesthesia Type:General  Level of Consciousness: awake  Airway and Oxygen Therapy: Patient Spontanous Breathing  Post-op Pain: none  Post-op Assessment: Post-op Vital signs reviewed, Patient's Cardiovascular Status Stable, Respiratory Function Stable, Patent Airway, No signs of Nausea or vomiting and Pain level controlled              Post-op Vital Signs: Reviewed and stable  Last Vitals:  Filed Vitals:   11/05/14 1039  BP: 139/76  Pulse: 101  Temp:   Resp: 16    Complications: No apparent anesthesia complications

## 2014-11-13 ENCOUNTER — Other Ambulatory Visit: Payer: Self-pay | Admitting: *Deleted

## 2014-11-13 DIAGNOSIS — G894 Chronic pain syndrome: Secondary | ICD-10-CM

## 2014-11-13 MED ORDER — OXYCODONE-ACETAMINOPHEN 10-325 MG PO TABS
ORAL_TABLET | ORAL | Status: DC
Start: 2014-11-13 — End: 2014-12-14

## 2014-11-14 ENCOUNTER — Telehealth: Payer: Self-pay | Admitting: Surgery

## 2014-11-14 NOTE — Telephone Encounter (Signed)
[name]  [birth year] [id number]  Patient Care Team: Gildardo Cranker, DO as PCP - General (Internal Medicine) Renella Cunas, MD as Consulting Physician (Cardiology) [name], MD as Consulting Physician (Ophthalmology) [name], MD as Consulting Physician (General Surgery)  This patient is a 80 y.o.female who calls today for surgical evaluation.   Date of procedure/visit: 11/05/2014  PREOPERATIVE DIAGNOSES: 1. Left breast suspicious lesion. 2. Right eyelid skin tag.  POSTOPERATIVE DIAGNOSES: 1. Left breast suspicious lesion. 2. Right eyelid skin tag.  PROCEDURES: 1. Radioactive seed localized left breast lumpectomy. 2. Excision of skin tag, right eyelid.  SURGEON: [name], M.D.  INDICATIONS: This is a 80 year old female who has had an abnormality on left breast on mammography over a wide area, which is retroareolar. She has had 2 separate stereotactic biopsies of this area. The biopsies have shown dilated ducts with breast parenchyma and no specific diagnosis other than the potential papilloma. The mammographic findings however are worrisome for malignancy, therefore, decision had been made to proceed with a radioactive seed localized left breast lumpectomy.  FINDINGS: The radioactive seed was found to be in the lumpectomy specimen and confirmed on x-ray and by Pathology.   Reason for call: Nipple bleeding  Patient is almost 2 weeks status post excision of retroareolar breast mass.  Patient called Sat AM noting she has had some bleeding from thew nipple starting last night after wearing a bra.  She has had to change a dressing x 2 since then.  There is no continuious dripping / uncontrolled bleeding.  While her breast is somewhat swollen and tender that has not changed markedly.  She is fully anticoagulated on warfarin.  H/o of pulmonary emboli.  I recommend she avoid wearing the bra as it may be irritating the skin.  I noted some  intermittent nipple bleeding with breast surgery is not unexpected in the setting of a retroareolar resection and especially being fully anticoagulated.  Is likely she has a small hematoma in the excision cavity that is leaking out the nipple.  I recommended placing an ice pack and gentle pressure over the nipple to help stop the bleeding.  The bleeding should taper off on its own over the next day or so.  If it markedly worsens, she has worsening pain, or if she has worsening concerns; then, go to emergency room for evaluation.  She may need her anticoagulation to be reversed.  I do not sense any major warning signs.  She felt comfortable with this plan.  We will try and check on her closely  Patient Active Problem List   Diagnosis Date Noted  . Gout 10/27/2014  . Breast mass 10/27/2014  . Encounter for therapeutic drug monitoring 10/08/2014  . Vertigo 09/24/2014  . Chronic bronchitis 09/23/2014  . Preoperative clearance 08/16/2014  . Abnormal stress test   . DOE (dyspnea on exertion) 06/24/2014  . CN (constipation) 05/10/2014  . History of pulmonary embolus (PE) 04/22/2014  . Sepsis secondary to UTI 04/21/2014  . COPD (chronic obstructive pulmonary disease) 04/21/2014  . Estrogen deficiency 02/19/2014  . Breast cancer screening 02/19/2014  . Weight gain 02/19/2014  . Pain in the chest 12/31/2013  . Type 2 diabetes mellitus with diabetic foot deformity 07/29/2013  . OSA (obstructive sleep apnea) 02/24/2013  . DM (diabetes mellitus), type 2, uncontrolled, with renal complications 68/12/8108  . Need for prophylactic vaccination and inoculation against influenza 01/08/2013  . Insomnia 09/17/2012  . Debility 08/01/2012  . Osteoarthritis   . Dysphagia,  unspecified(787.20) 07/31/2012  . GERD (gastroesophageal reflux disease) 07/31/2012  . Long term current use of anticoagulant therapy 02/02/2012  . Chronic diastolic congestive heart failure 01/29/2012  . History of deep venous thrombosis  01/27/2012  . Abdominal pain 01/26/2012  . CHOLELITHIASIS 06/06/2010  . Situational depression 06/04/2009  . ORTHOPNEA 03/09/2009  . OBESITY, MORBID 02/15/2009  . ANEMIA 02/15/2009  . POLYMYALGIA RHEUMATICA 01/18/2009  . TENSION HEADACHE 01/07/2008  . Headache 01/07/2008  . FUNGAL DERMATITIS 11/13/2007  . CAD (coronary artery disease) 08/29/2007  . URINARY INCONTINENCE 08/29/2007  . Hyperlipidemia 12/03/2006  . Essential hypertension 12/03/2006  . DIVERTICULITIS, HX OF 12/03/2006    Past Medical History  Diagnosis Date  . Hyperlipidemia     takes Pravastatin daily  . Coronary artery disease     a. s/p IMI 2004 tx with BMS to RCA;  b. s/p Promus DES to RCA 2/12 (cath: LM 20-30%, pLAD 20-30%, mLAD 50%, RI 30%, pRCA 60%, mRCA 90% - tx with PCI);  c. myoview 1/07: EF 49%, inf scar, no isch  . Urinary incontinence     takes Linzess daily  . Insomnia   . Osteoarthritis   . Obstructive sleep apnea   . Polymyalgia rheumatica   . Arthritis   . Pulmonary emboli 9/13    felt to need lifelong anticoagulation  . Allergy     takes Mucinex daily as needed  . Gout, unspecified     takes Colchicine daily  . Dysuria   . Pain, chronic   . Osteoarthrosis, unspecified whether generalized or localized, lower leg   . Anemia, unspecified   . Coronary atherosclerosis of native coronary artery   . CHF (congestive heart failure)     takes Furosemide daily  . Depression     takes Cymbalta daily  . Anxiety     takes Clonazepam daily as needed  . Hypertension     takes Imdur and Metoprolol daily  . GERD (gastroesophageal reflux disease)     takes Protonix daily  . Diabetes mellitus     insulin daily  . Type II or unspecified type diabetes mellitus without mention of complication, uncontrolled   . Myocardial infarction 2004  . History of blood clots about 56yr ago    in legs-takes Coumadin   . Shortness of breath dyspnea     with exertion and has Albuterol inhaler prn  . Headache      occasionally  . Vertigo     hx of;was taking Meclizine if needed  . Numbness   . Joint pain   . Joint swelling   . Osteoarthritis     Past Surgical History  Procedure Laterality Date  . Appendectomy    . Cardiac catheterization    . Gastric bypass  1977     [facility] . Blood clots/legs and lungs  2013  . Mi with stent placement  2004  . Left heart catheterization with coronary angiogram N/A 06/29/2014    Procedure: LEFT HEART CATHETERIZATION WITH CORONARY ANGIOGRAM;  Surgeon: TTroy Sine MD;  [facility];  Service: Cardiovascular;  Laterality: N/A;  . Coronary angioplasty  2  . Eye surgery Bilateral     cataract   . Colonoscopy    . Abdominal hysterectomy      partial  . Breast lumpectomy with radioactive seed localization Left 11/05/2014    Procedure: LEFT BREAST LUMPECTOMY WITH RADIOACTIVE SEED LOCALIZATION;  Surgeon: DCoralie Keens MD;  [place] [facility]  Service: General;  Laterality: Left;  . Excision of skin tag Right 11/05/2014    Procedure: EXCISION OF RIGHT EYELID SKIN TAG;  Surgeon: [name], MD;  Location: [place];  Service: General;  Laterality: Right;    History   Social History  . Marital Status: Widowed    Spouse Name: N/A  . Number of Children: N/A  . Years of Education: N/A   Occupational History  . retired    Social History Main Topics  . Smoking status: Never Smoker   . Smokeless tobacco: Not on file  . Alcohol Use: No  . Drug Use: No  . Sexual Activity: Not Currently   Other Topics Concern  . Not on file   Social History Narrative    Family History  Problem Relation Age of Onset  . Breast cancer Mother   . Heart disease Mother   . Throat cancer Father   . Hypertension Father   . Arthritis Father   . Diabetes Father   . Arthritis Sister   . Obesity Sister   . Diabetes Sister     Current Outpatient Prescriptions  Medication Sig Dispense Refill  . acetaminophen (TYLENOL) 325 MG tablet Take  2 tablets (650 mg total) by mouth every 6 (six) hours as needed for mild pain (or Fever >/= 101).    [name] albuterol (PROVENTIL HFA;VENTOLIN HFA) 108 (90 BASE) MCG/ACT inhaler Inhale 2 puffs into the lungs every 6 (six) hours as needed for wheezing or shortness of breath. 1 Inhaler 0  . allopurinol (ZYLOPRIM) 300 MG tablet Take 1 tablet (300 mg total) by mouth daily. 30 tablet 6  . budesonide-formoterol (SYMBICORT) 160-4.5 MCG/ACT inhaler In 2 puffs twice daily to help wheezing 1 Inhaler 3  . Calcium Carbonate Antacid (TUMS PO) Take 3 tablets by mouth daily as needed (acid reflux).    . Cinnamon 500 MG capsule Take 500 mg by mouth 2 (two) times daily.    . clonazePAM (KLONOPIN) 1 MG tablet Take two tablets at bedtime for sleep (Patient taking differently: Take 2 mg by mouth. Take two tablets at bedtime for sleep) 60 tablet 0  . diphenhydrAMINE (BENADRYL) 25 MG tablet Take 50 mg by mouth at bedtime. For itching    . DULoxetine (CYMBALTA) 60 MG capsule Take 1 capsule by mouth every day for anxiety (Patient taking differently: Take 60 mg by mouth daily. for anxiety) 90 capsule 1  . furosemide (LASIX) 20 MG tablet Take one tablet by mouth twice daily for edema (Patient taking differently: Take 20 mg by mouth 2 (two) times daily. for edema) 180 tablet 1  . guaiFENesin-dextromethorphan (ROBITUSSIN DM) 100-10 MG/5ML syrup Take 5 mLs by mouth every 4 (four) hours as needed for cough. 118 mL 0  . HYDROcodone-acetaminophen (NORCO) 5-325 MG per tablet Take 1-2 tablets by mouth every 6 (six) hours as needed. 30 tablet 0  . Insulin Glargine (TOUJEO SOLOSTAR) 300 UNIT/ML SOPN Inject 45 Units into the skin 2 (two) times daily before a meal. Titrate up as needed. 10 pen 11  . Insulin Lispro, Human, (HUMALOG KWIKPEN) 200 UNIT/ML SOPN Inject 35 Units into the skin 3 (three) times daily before meals. (Patient taking differently: Inject 35 Units into the skin See admin instructions. Inject 35 units at each meal (most days  twice daily, occasionally 3 times daily)) 12 mL 6  . isosorbide mononitrate (IMDUR) 60 MG 24 hr tablet Take 1 tablet (60 mg total) by mouth daily. 90 tablet 1  . Lancets [facility]  ULTRASOFT) lancets Use to test blood sugar Dx: E11.9 100 each 12  . Linaclotide (LINZESS) 145 MCG CAPS capsule Take 1 capsule (145 mcg total) by mouth daily. Sample provided 30 capsule 3  . meclizine (ANTIVERT) 25 MG tablet Take 1 tablet (25 mg total) by mouth 3 (three) times daily. 90 tablet 0  . metoprolol succinate (TOPROL-XL) 25 MG 24 hr tablet Take 1 tablet by mouth once every day for blood pressure (Patient taking differently: Take 25 mg by mouth daily. for blood pressure) 90 tablet 1  . nitroGLYCERIN (NITROSTAT) 0.4 MG SL tablet Take one tablet under the tongue every 5 minutes as needed for chest pain (Patient taking differently: Place 0.4 mg under the tongue every 5 (five) minutes as needed for chest pain. ) 50 tablet 0  . oxyCODONE-acetaminophen (PERCOCET) 10-325 MG per tablet Take one tablet every 6 hours as needed for pain 120 tablet 0  . pantoprazole (PROTONIX) 40 MG tablet TAKE ONE TABLET BY MOUTH ONCE DAILY 90 tablet 1  . pravastatin (PRAVACHOL) 20 MG tablet Take 1 tablet by mouth once every day for cholesterol (Patient taking differently: Take 20 mg by mouth daily. for cholesterol) 90 tablet 1  . warfarin (COUMADIN) 5 MG tablet Take 2 tablets (10 mg total) by mouth daily. Please take 7 mg oral daily on Tues, Thursday, Sat, then 5 mg on Mon, Wed Fri, Sun. (Patient not taking: Reported on 10/29/2014) 90 tablet 3  . warfarin (COUMADIN) 5 MG tablet Take 5 mg by mouth See admin instructions. Take 2 tablets (79m total) by mouth daily. Please take 740moral daily on Tues, Thursday, Sat, Then 76m81mn Mon wed and fri, sundays     No current facility-administered medications for this visit.     Allergies  Allergen Reactions  . Sulfonamide Derivatives Swelling    Mouth swelling  . Tramadol Nausea And Vomiting     '@VS' @  Mm Breast Surgical Specimen  11/05/2014   CLINICAL DATA:  Post surgical excision of the left breast.  EXAM: SPECIMEN RADIOGRAPH OF THE LEFT BREAST  COMPARISON:  Previous exam(s).  FINDINGS: Status post excision of the left breast. The cylindrical shaped radioactive seed and biopsy marker clip are present, completely intact, and were marked for pathology. The ribbon shaped biopsy marking clip is not present in the surgical specimen. This was discussed with the Dr. BlaPryor Montesrse in the OR [place] stated he is aware of this and fine with leaving the ribbon shaped marking clip within the breast.  IMPRESSION: Specimen radiograph of the left breast.   Electronically Signed   By: JenEverlean AlstromD.   On: 11/05/2014 09:02   Mm Lt Radioactive Seed Loc Mammo Guide  11/04/2014   CLINICAL DATA:  Preoperative radioactive seed localization after 2 left breast biopsies demonstrating duct ectasia. Per previous discussion with Dr. BlaNinfa Lindenhis seed is to be placed between the 2 previously placed left retroareolar clips.  EXAM: MAMMOGRAPHIC GUIDED RADIOACTIVE SEED LOCALIZATION OF THE left BREAST  COMPARISON:  Previous exam(s).  FINDINGS: Patient presents for radioactive seed localization prior to excisional biopsy. I met with the patient and we discussed the procedure of seed localization including benefits and alternatives. We discussed the high likelihood of a successful procedure. We discussed the risks of the procedure including infection, bleeding, tissue injury and further surgery. We discussed the low dose of radioactivity involved in the procedure. Informed, written consent was given.  The usual time-out protocol was performed immediately prior to the procedure.  Using mammographic guidance, sterile technique, 2% lidocaine and an I-125 radioactive seed, the space between 2 left breast retroareolar clips was localized using a superior to inferior approach. The follow-up mammogram images confirm the seed  in the expected location and were marked for Dr. Ninfa Linden.  Follow-up survey of the patient confirms presence of the radioactive seed.  Order number of I-125 seed:  [id number].  Total activity:  0.250 mCi  Reference Date: 10/23/2014  The patient tolerated the procedure well and was released from the [place]. She was given instructions regarding seed removal.  IMPRESSION: Radioactive seed localization left breast. No apparent complications.   Electronically Signed   By: Conchita Paris M.D.   On: 11/04/2014 13:53    Note: This dictation was prepared with Dragon/digital dictation along with [employer]. Any transcriptional errors that result from this process are unintentional.

## 2014-11-17 ENCOUNTER — Telehealth: Payer: Self-pay | Admitting: Cardiology

## 2014-11-17 ENCOUNTER — Ambulatory Visit (INDEPENDENT_AMBULATORY_CARE_PROVIDER_SITE_OTHER): Payer: Medicare PPO | Admitting: Pharmacist

## 2014-11-17 DIAGNOSIS — Z86711 Personal history of pulmonary embolism: Secondary | ICD-10-CM

## 2014-11-17 DIAGNOSIS — Z5181 Encounter for therapeutic drug level monitoring: Secondary | ICD-10-CM

## 2014-11-17 DIAGNOSIS — Z86718 Personal history of other venous thrombosis and embolism: Secondary | ICD-10-CM

## 2014-11-17 LAB — POCT INR: INR: 1.9

## 2014-11-17 NOTE — Telephone Encounter (Signed)
Spoke with the [name] and Daughter (on [place]) to inform both parties that we can see the [name] in our office with the Flex/DOD Truitt Merle NP at 1100 for complaints of DOE and increase SOB.  [name] and daughter verbalized understanding of appt date, time, and location for tomorrow, and agrees with this plan.

## 2014-11-17 NOTE — Telephone Encounter (Signed)
New message      Pt has increased SOB  Pt c/o Shortness Of Breath: STAT if SOB developed within the last 24 hours or pt is noticeably SOB on the phone  1. Are you currently SOB (can you hear that pt is SOB on the phone)? No, patient lying in bed  2. How long have you been experiencing SOB? 2 days  3. Are you SOB when sitting or when up moving around? Both  4. Are you currently experiencing any other symptoms? Stomach bothering her and pt has a lot of gas   Pt is supposed to be discharged from [place] next week, but if doctor needs her to be seen further due to the SOB they need re-certification. Please call to discuss

## 2014-11-17 NOTE — Telephone Encounter (Signed)
Donita Nurse from [facility] calling to see if the pt could be seen by Dr Meda Coffee sometime this week for complaints of increased SOB, for possible exacerbation of CHF.  Per Donita the pt has been lying around for the past 2 days because she complains when she gets up to ambulate she becomes DOE.  Reported V/S from [facility] from her visit with the pt today is :  144/84-70 irreg,- Resp 17- 97% RA, Temp- 97.1 F Per HHN Donita the pts lung sounds are slightly diminished bilaterally, but clear bilaterally.  Per Donita, the pt has been non-compliant with weighing herself daily and logging this information.  Per Donita, the pt has been eating and drinking appropriately.  Per Donita, the pts abdomen is slightly distended, and she complains of being full, and has noted copious amounts of gas.  Donita unsure of last BM.  Per Donita, HHN, the pt is in no acute distress as of this mornings visit in the home with the pt. Per Donita, she feels this is the pts CHF, cardiac issue.  Per Donita she states she will be reaching out to the MD who ordered for [facility] to come into the home, for a re-certification to care for the pt in the home, due to the nursing need is still there. Informed Donita that Dr Meda Coffee is out of the office the rest of the week, but I will follow-up with her to see if we can work the pt in on the flex/DOD schedule for sometime this week.  Donita verbalized understanding and agrees with this plan.  Informed Donita that I will also reach out to the pt and daughters (on [place]) in regards to the appt needed.

## 2014-11-17 NOTE — Telephone Encounter (Signed)
Tried contacting the pt and both daughters listed on file and no answer at any call placed.  Got the pt scheduled on the Flex/DOD schedule with Truitt Merle NP for tomorrow 11/18/14 at 1100.  Contacted the Home health nurse Donita to inform her of scheduled flex appt for the pt for exacerbation of CHF and increased SOB.  Informed Donita that I placed a call to the pt and daughters to inform them of this appt tomorrow and that nobody answered.  Per Donita she states that the pt did have a dentist appt sometime this morning, for that might be the reason nobody has answered.  Per Donita, she states she will try to reach out to the pt and daughters multiple times today and follow-up with our office when she makes contact with them.  Our office will continue to follow-up with all parties to inform of work-in on the flex schedule for tomorrow 7/13 at 1100 with Truitt Merle for complaints.

## 2014-11-18 ENCOUNTER — Ambulatory Visit (INDEPENDENT_AMBULATORY_CARE_PROVIDER_SITE_OTHER): Payer: Medicare PPO | Admitting: Nurse Practitioner

## 2014-11-18 ENCOUNTER — Encounter: Payer: Self-pay | Admitting: Genetic Counselor

## 2014-11-18 DIAGNOSIS — I2510 Atherosclerotic heart disease of native coronary artery without angina pectoris: Secondary | ICD-10-CM

## 2014-11-18 DIAGNOSIS — I5032 Chronic diastolic (congestive) heart failure: Secondary | ICD-10-CM | POA: Diagnosis not present

## 2014-11-18 DIAGNOSIS — I2583 Coronary atherosclerosis due to lipid rich plaque: Secondary | ICD-10-CM

## 2014-11-18 DIAGNOSIS — I5031 Acute diastolic (congestive) heart failure: Secondary | ICD-10-CM | POA: Diagnosis not present

## 2014-11-18 NOTE — Patient Instructions (Addendum)
We will be checking the following labs today - NONE   Medication Instructions:    Continue with your current medicines.     Testing/Procedures To Be Arranged:  N/A  Follow-Up:   Go to [place] tomorrow at 4 pm.    Other Special Instructions:   N/A  Call the [place] office at [phone] if you have any questions, problems or concerns.

## 2014-11-18 NOTE — Progress Notes (Signed)
CARDIOLOGY OFFICE NOTE  Date:  11/18/2014    [name] Date of Birth: 1934-08-02 Medical Record [id number]  PCP:  Gildardo Cranker, DO  Cardiologist:  Meda Coffee    Chief Complaint  Patient presents with  . Congestive Heart Failure    Work in visit - seen for Dr. Meda Coffee    History of Present Illness: [name] is a 80 y.o. female who presents today for a work in visit. Seen for Dr. Meda Coffee. She has been seen by Dr. [name] in the past.   She has multiple issues which include known CAD with past inferior MI in 2004. She has had bare-metal stent to the RCA in 2004 and drug-eluting stent placement to the RCA in 06/2010 in the setting of unstable angina with overall preserved LV function. Last cath in 2012 showing otherwise nonobstructive disease. Her other problems include morbid obesity, severe OA - basically confined to a wheelchair, COPD, DM, HTN, HLD, incontinence, OSA, polymyalgia rheumatica, anxiety, depression and gallbladder disease. She is on chronic anticoagulation for PE since September of 2013. Echo last September 2013 was a TDS but with a normal EF noted  In February 2016, she developed increasing episodes of exertional shortness of breath. A nuclear perfusion study was interpreted as high risk of inferior and anteroapical ischemia. Her Coumadin was held and she was referred to undergo cardiac catheterization. This was done on 06/29/2014 and showed normal global LV function with mild mid inferior hypocontractility. There was evidence for coronary calcification with smooth 20% proximal narrowing followed by 30% stenosis before the first diagonal branch, 50-60% stenosis in the proximal diagonal 1 vessel, 30% mid LAD smooth narrowing, and 20% smooth proximal left circumflex stenosis. Her RCA stents were widely patent.  She was recently found to have a left breast mass and underwent a pre op clearance visit with Dr. Claiborne Billings back in April of 2016. Had her surgery in June per  Dr. Rush Farmer.   Phone call yesterday -  Donita Nurse from [place] calling to see if the pt could be seen by Dr Meda Coffee sometime this week for complaints of increased SOB, for possible exacerbation of CHF.  Per Donita the pt has been lying around for the past 2 days because she complains when she gets up to ambulate she becomes DOE.  Reported V/S from [place] from her visit with the pt today is : 144/84-70 irreg,- Resp 17- 97% RA, Temp- 97.1 F Per HHN Donita the pts lung sounds are slightly diminished bilaterally, but clear bilaterally.  Per Donita, the pt has been non-compliant with weighing herself daily and logging this information.  Per Donita, the pt has been eating and drinking appropriately.  Per Donita, the pts abdomen is slightly distended, and she complains of being full, and has noted copious amounts of gas. Donita unsure of last BM.  Per Donita, HHN, the pt is in no acute distress as of this mornings visit in the home with the pt. Per Donita, she feels this is the pts CHF, cardiac issue.  Per Donita she states she will be reaching out to the MD who ordered for [facility] to come into the home, for a re-certification to care for the pt in the home, due to the nursing need is still there. Informed Donita that Dr Meda Coffee is out of the office the rest of the week, but I will follow-up with her to see if we can work the pt in on the flex/DOD schedule for  sometime this week.  Donita verbalized understanding and agrees with this plan.  Informed Donita that I will also reach out to the pt and daughters (on [place]) in regards to the appt needed.          Thus added to the FLEX.  Comes in today. Here with 2 family members. She immediately tells me that "nothing is wrong with my heart". Her stomach is "killing her". She remains constipated. Says last bowel movement 2 days ago. Does not take the Linzess due to "never knowing when it works". No chest pain. She is always short of breath.  Says this is no worse than her normal shortness of breath. Seeing the surgeon later today - family reports that she does have breast cancer - patient is not aware. Apparently her incision is draining.   Past Medical History  Diagnosis Date  . Hyperlipidemia     takes Pravastatin daily  . Coronary artery disease     a. s/p IMI 2004 tx with BMS to RCA;  b. s/p Promus DES to RCA 2/12 (cath: LM 20-30%, pLAD 20-30%, mLAD 50%, RI 30%, pRCA 60%, mRCA 90% - tx with PCI);  c. myoview 1/07: EF 49%, inf scar, no isch  . Urinary incontinence     takes Linzess daily  . Insomnia   . Osteoarthritis   . Obstructive sleep apnea   . Polymyalgia rheumatica   . Arthritis   . Pulmonary emboli 9/13    felt to need lifelong anticoagulation  . Allergy     takes Mucinex daily as needed  . Gout, unspecified     takes Colchicine daily  . Dysuria   . Pain, chronic   . Osteoarthrosis, unspecified whether generalized or localized, lower leg   . Anemia, unspecified   . Coronary atherosclerosis of native coronary artery   . CHF (congestive heart failure)     takes Furosemide daily  . Depression     takes Cymbalta daily  . Anxiety     takes Clonazepam daily as needed  . Hypertension     takes Imdur and Metoprolol daily  . GERD (gastroesophageal reflux disease)     takes Protonix daily  . Diabetes mellitus     insulin daily  . Type II or unspecified type diabetes mellitus without mention of complication, uncontrolled   . Myocardial infarction 2004  . History of blood clots about 76yrs ago    in legs-takes Coumadin   . Shortness of breath dyspnea     with exertion and has Albuterol inhaler prn  . Headache     occasionally  . Vertigo     hx of;was taking Meclizine if needed  . Numbness   . Joint pain   . Joint swelling   . Osteoarthritis     Past Surgical History  Procedure Laterality Date  . Appendectomy    . Cardiac catheterization    . Gastric bypass  1977     reversed in [facility], [facility]   . Blood clots/legs and lungs  2013  . Mi with stent placement  2004  . Left heart catheterization with coronary angiogram N/A 06/29/2014    Procedure: LEFT HEART CATHETERIZATION WITH CORONARY ANGIOGRAM;  Surgeon: Troy Sine, MD;  Location: [facility] CATH LAB;  Service: Cardiovascular;  Laterality: N/A;  . Coronary angioplasty  2  . Eye surgery Bilateral     cataract   . Colonoscopy    . Abdominal hysterectomy      partial  .  Breast lumpectomy with radioactive seed localization Left 11/05/2014    Procedure: LEFT BREAST LUMPECTOMY WITH RADIOACTIVE SEED LOCALIZATION;  Surgeon: [name], MD;  Location: [place];  Service: General;  Laterality: Left;  . Excision of skin tag Right 11/05/2014    Procedure: EXCISION OF RIGHT EYELID SKIN TAG;  Surgeon: [name], MD;  Location: [place];  Service: General;  Laterality: Right;     Medications: Current Outpatient Prescriptions  Medication Sig Dispense Refill  . acetaminophen (TYLENOL) 325 MG tablet Take 2 tablets (650 mg total) by mouth every 6 (six) hours as needed for mild pain (or Fever >/= 101).    [name] albuterol (PROVENTIL HFA;VENTOLIN HFA) 108 (90 BASE) MCG/ACT inhaler Inhale 2 puffs into the lungs every 6 (six) hours as needed for wheezing or shortness of breath. 1 Inhaler 0  . allopurinol (ZYLOPRIM) 300 MG tablet Take 1 tablet (300 mg total) by mouth daily. 30 tablet 6  . budesonide-formoterol (SYMBICORT) 160-4.5 MCG/ACT inhaler In 2 puffs twice daily to help wheezing 1 Inhaler 3  . Calcium Carbonate Antacid (TUMS PO) Take 3 tablets by mouth daily as needed (acid reflux).    . Cinnamon 500 MG capsule Take 500 mg by mouth 2 (two) times daily.    . clonazePAM (KLONOPIN) 1 MG tablet Take two tablets at bedtime for sleep (Patient taking differently: Take 2 mg by mouth. Take two tablets at bedtime for sleep) 60 tablet 0  . diphenhydrAMINE (BENADRYL) 25 MG tablet Take 50 mg by mouth at bedtime. For itching    . DULoxetine (CYMBALTA) 60 MG  capsule Take 1 capsule by mouth every day for anxiety (Patient taking differently: Take 60 mg by mouth daily. for anxiety) 90 capsule 1  . furosemide (LASIX) 20 MG tablet Take one tablet by mouth twice daily for edema (Patient taking differently: Take 20 mg by mouth 2 (two) times daily. for edema) 180 tablet 1  . guaiFENesin-dextromethorphan (ROBITUSSIN DM) 100-10 MG/5ML syrup Take 5 mLs by mouth every 4 (four) hours as needed for cough. 118 mL 0  . HYDROcodone-acetaminophen (NORCO) 5-325 MG per tablet Take 1-2 tablets by mouth every 6 (six) hours as needed. 30 tablet 0  . Insulin Glargine (TOUJEO SOLOSTAR) 300 UNIT/ML SOPN Inject 45 Units into the skin 2 (two) times daily before a meal. Titrate up as needed. 10 pen 11  . Insulin Lispro, Human, (HUMALOG KWIKPEN) 200 UNIT/ML SOPN Inject 35 Units into the skin 3 (three) times daily before meals. (Patient taking differently: Inject 35 Units into the skin See admin instructions. Inject 35 units at each meal (most days twice daily, occasionally 3 times daily)) 12 mL 6  . isosorbide mononitrate (IMDUR) 60 MG 24 hr tablet Take 1 tablet (60 mg total) by mouth daily. 90 tablet 1  . Lancets (ONETOUCH ULTRASOFT) lancets Use to test blood sugar Dx: E11.9 100 each 12  . Linaclotide (LINZESS) 145 MCG CAPS capsule Take 1 capsule (145 mcg total) by mouth daily. Sample provided 30 capsule 3  . meclizine (ANTIVERT) 25 MG tablet Take 1 tablet (25 mg total) by mouth 3 (three) times daily. 90 tablet 0  . metoprolol succinate (TOPROL-XL) 25 MG 24 hr tablet Take 1 tablet by mouth once every day for blood pressure (Patient taking differently: Take 25 mg by mouth daily. for blood pressure) 90 tablet 1  . nitroGLYCERIN (NITROSTAT) 0.4 MG SL tablet Take one tablet under the tongue every 5 minutes as needed for chest pain (Patient taking differently: Place 0.4  mg under the tongue every 5 (five) minutes as needed for chest pain. ) 50 tablet 0  . oxyCODONE-acetaminophen (PERCOCET)  10-325 MG per tablet Take one tablet every 6 hours as needed for pain 120 tablet 0  . pantoprazole (PROTONIX) 40 MG tablet TAKE ONE TABLET BY MOUTH ONCE DAILY 90 tablet 1  . pravastatin (PRAVACHOL) 20 MG tablet Take 1 tablet by mouth once every day for cholesterol (Patient taking differently: Take 20 mg by mouth daily. for cholesterol) 90 tablet 1  . warfarin (COUMADIN) 5 MG tablet Take 2 tablets (10 mg total) by mouth daily. Please take 7 mg oral daily on Tues, Thursday, Sat, then 5 mg on Mon, Wed Fri, Sun. 90 tablet 3  . warfarin (COUMADIN) 5 MG tablet Take 5 mg by mouth See admin instructions. Take 2 tablets (10mg  total) by mouth daily. Please take 7mg  oral daily on Tues, Thursday, Sat, Then 5mg  on Mon wed and fri, sundays     No current facility-administered medications for this visit.    Allergies: Allergies  Allergen Reactions  . Sulfonamide Derivatives Swelling    Mouth swelling  . Tramadol Nausea And Vomiting    Social History: The patient  reports that she has never smoked. She does not have any smokeless tobacco history on file. She reports that she does not drink alcohol or use illicit drugs.   Family History: [name] family history includes Arthritis in her father and sister; Breast cancer (age of onset: 75) in her mother; Diabetes in her father and sister; Heart disease in her mother; Hypertension in her father; Obesity in her sister; Throat cancer in her father.   Review of Systems: Please see the history of present illness.   Otherwise, the review of systems is positive for abdominal pain.   All other systems are reviewed and negative.   Physical Exam: VS:  BP 142/68 mmHg  Pulse 85  Ht 5\' 7"  (1.702 m)  Wt 368 lb (166.924 kg)  BMI 57.62 kg/m2  SpO2 92% .  BMI Body mass index is 57.62 kg/(m^2).  Wt Readings from Last 3 Encounters:  11/18/14 368 lb (166.924 kg)  11/05/14 366 lb (166.017 kg)  10/29/14 366 lb (166.017 kg)    General: Morbidly obese. In no acute  distress.  HEENT: Normal. Neck: Supple, no JVD, carotid bruits, or masses noted.  Cardiac: Heart tones distant. Respiratory:  Lungs are fairly clear to auscultation bilaterally.   GI: Quite obese. Maybe a little firm but hard to assess due to her body habitus. Decreased bowel sounds noted.  MS: No deformity or atrophy. Gait not tested.  Skin: Warm and dry. Color is normal.  Neuro:  Strength and sensation are intact and no gross focal deficits noted.  Psych: Alert, appropriate and with normal affect.   LABORATORY DATA:  EKG:  EKG is not ordered today.   Lab Results  Component Value Date   WBC 9.4 10/29/2014   HGB 10.0* 10/29/2014   HCT 32.6* 10/29/2014   PLT 305 10/29/2014   GLUCOSE 313* 10/29/2014   CHOL 171 06/04/2014   TRIG 225* 06/04/2014   HDL 41 06/04/2014   LDLCALC 85 06/04/2014   ALT 21 09/24/2014   AST 22 09/24/2014   NA 136 10/29/2014   K 4.7 10/29/2014   CL 101 10/29/2014   CREATININE 1.34* 10/29/2014   BUN 16 10/29/2014   CO2 27 10/29/2014   TSH 0.733 06/25/2014   INR 1.9 11/17/2014   HGBA1C 10.7* 09/03/2014  Lab Results  Component Value Date   INR 1.9 11/17/2014   INR 1.13 11/05/2014   INR 1.6 10/30/2014     BNP (last 3 results)  Recent Labs  06/24/14 1821  BNP 62.6    ProBNP (last 3 results) No results for input(s): PROBNP in the last 8760 hours.   Other Studies Reviewed Today: Echo Study Conclusions from 06/2014  - Left ventricle: The cavity size was normal. There was moderate concentric hypertrophy. Systolic function was normal. The estimated ejection fraction was in the range of 60% to 65%. Wall motion was normal; there were no regional wall motion abnormalities. There was an increased relative contribution of atrial contraction to ventricular filling. Doppler parameters are consistent with abnormal left ventricular relaxation (grade 1 diastolic dysfunction).  CARDIAC CATHETERIZATION    PROCEDURE: Left heart  catheterization via the right radial artery approach: Coronary angiography, left ventriculography  ANGIOGRAPHY:   Fluoroscopy revealed coronary calcification of left coronary system and RCA.  The left main coronary artery was a moderate-sized long vessel which wasangiographically normal and bifurcated into the LAD and left circumflex coronary artery.   The LAD had proximal calcification. There was smooth proximal narrowing before the first septal perforating artery. There was 30% narrowing proximal to the takeoff of the first agonal vessel with 50-60% narrowing in the proximal portion of this diagonal and 30% narrowing in the LAD beyond the diagonal vessel.  The left circumflex coronary artery was a moderate size vessel that had smooth 20% proximal narrowing prior to giving off one major bifurcating marginal branch.  The RCA was large caliber vessel. There was a patent stent in the proximal RCA and a patent stent in the mid RCA. The remainder of the RCA was free of significant disease. The vessel supplied a moderate size PDA and smaller PLA vessel.  Left ventriculography revealed normal global LV contractility with mild mid inferior hypocontractility. There was no evidence for mitral regurgitation. Ejection fraction is 55-60%.   IMPRESSION:  Normal global LV function with mild mid inferior hypocontractility.  Coronary calcification with smooth 20% proximal narrowing followed by 30% stenosis before the first diagonal branch, 50-60% stenosis in the proximal diagonal 1 vessel with 30% mid LAD smooth narrowing; 20% smooth stenosis in the proximal left circumflex: Artery, and widely patent proximal and mid RCA stents without significant RCA stenoses.   RECOMMENDATION:  Medical therapy. Troy Sine, MD, [facility] 06/29/2014 2:14 PM   Assessment/Plan: 1. Abdominal pain - this is her most pressing issue. I think her cardiac status is stable. Needs to see PCP - have arranged follow up  for tomorrow. They could see her today but she is seeing surgeon today.   2. Morbid obesity  3. CAD with prior PCI to the RCA - last cath from 06/2014 showing stents patent   4. Chronic diastolic dysfunction with chronic dyspnea - this does not appear to be worse.   5. Chronic coumadin  Current medicines are reviewed with the patient today.  The patient does not have concerns regarding medicines other than what has been noted above.  The following changes have been made:  See above.  Labs/ tests ordered today include:   No orders of the defined types were placed in this encounter.     Disposition:   FU with Dr. Meda Coffee as planned.  Patient is agreeable to this plan and will call if any problems develop in the interim.   Signed: Burtis Junes, RN, ANP-C 11/18/2014 11:35 AM  [facility] [address] [place], [place]  [address] Phone: [phone] Fax: [phone]

## 2014-11-19 ENCOUNTER — Ambulatory Visit (INDEPENDENT_AMBULATORY_CARE_PROVIDER_SITE_OTHER): Payer: Medicare PPO | Admitting: Nurse Practitioner

## 2014-11-19 ENCOUNTER — Encounter: Payer: Self-pay | Admitting: Nurse Practitioner

## 2014-11-19 VITALS — BP 138/78 | HR 71 | Temp 98.1°F | Ht 67.0 in | Wt 367.0 lb

## 2014-11-19 DIAGNOSIS — K5901 Slow transit constipation: Secondary | ICD-10-CM

## 2014-11-19 DIAGNOSIS — R05 Cough: Secondary | ICD-10-CM

## 2014-11-19 DIAGNOSIS — R109 Unspecified abdominal pain: Secondary | ICD-10-CM

## 2014-11-19 DIAGNOSIS — R058 Other specified cough: Secondary | ICD-10-CM

## 2014-11-19 NOTE — Patient Instructions (Signed)
To STOP taking all other constipation medications Take Linzess daily If diarrhea occurs take every other day  If you develop fever, chills, nausea or vomiting seek medical attention immediately  Follow up if symptoms do not improve with bowel movements

## 2014-11-19 NOTE — Progress Notes (Signed)
Patient ID: [name], female   DOB: May 13, 1934, 80 y.o.   MRN: [id number]    PCP: Gildardo Cranker, DO  Allergies  Allergen Reactions  . Sulfonamide Derivatives Swelling    Mouth swelling  . Tramadol Nausea And Vomiting    Chief Complaint  Patient presents with  . Acute Visit    Bad stomach pains x 2 weeks ago. Last BM was Sunday. Patient with BM every 4 days unless she takes medications (OTC medications)     HPI: Patient is a 80 y.o. female seen in the office today due to abdominal pain. Pt with a pmh morbid obesity, severe OA - basically confined to a wheelchair, COPD, DM, HTN, HLD, incontinence, OSA, polymyalgia rheumatica, anxiety, depression and gallbladder disease. Pt has been having complaints of abdominal pain for 2 weeks. Constipated, Last bowel movement 5 days ago. Taking x-lax but that hurt her stomach so she quit taking. linzess does not know when she has to go so she does not take it. Was taking stool softener with linzess, still taking 3 stools softners every night   eating ice all the time, eating okay, no nausea or vomiting. No fever.  Pains with randomly come on. More often this week. Cramp like pain   Coughing up green sputum for over a year. No changes in sputum. Worse in the morning. Rarely coughs throughout the day.  Review of Systems:  Review of Systems  Constitutional: Negative for fever, chills, diaphoresis, activity change, appetite change and fatigue.  HENT: Negative for congestion.   Respiratory: Positive for cough (in the morning).        Green sputum only in the morning  Cardiovascular: Negative for chest pain.  Gastrointestinal: Positive for abdominal pain and constipation. Negative for nausea, vomiting, diarrhea, blood in stool, abdominal distention and rectal pain.    Past Medical History  Diagnosis Date  . Hyperlipidemia     takes Pravastatin daily  . Coronary artery disease     a. s/p IMI 2004 tx with BMS to RCA;  b. s/p Promus DES to RCA  2/12 (cath: LM 20-30%, pLAD 20-30%, mLAD 50%, RI 30%, pRCA 60%, mRCA 90% - tx with PCI);  c. myoview 1/07: EF 49%, inf scar, no isch  . Urinary incontinence     takes Linzess daily  . Insomnia   . Osteoarthritis   . Obstructive sleep apnea   . Polymyalgia rheumatica   . Arthritis   . Pulmonary emboli 9/13    felt to need lifelong anticoagulation  . Allergy     takes Mucinex daily as needed  . Gout, unspecified     takes Colchicine daily  . Dysuria   . Pain, chronic   . Osteoarthrosis, unspecified whether generalized or localized, lower leg   . Anemia, unspecified   . Coronary atherosclerosis of native coronary artery   . CHF (congestive heart failure)     takes Furosemide daily  . Depression     takes Cymbalta daily  . Anxiety     takes Clonazepam daily as needed  . Hypertension     takes Imdur and Metoprolol daily  . GERD (gastroesophageal reflux disease)     takes Protonix daily  . Diabetes mellitus     insulin daily  . Type II or unspecified type diabetes mellitus without mention of complication, uncontrolled   . Myocardial infarction 2004  . History of blood clots about 80yrs ago    in legs-takes Coumadin   . Shortness of  breath dyspnea     with exertion and has Albuterol inhaler prn  . Headache     occasionally  . Vertigo     hx of;was taking Meclizine if needed  . Numbness   . Joint pain   . Joint swelling   . Osteoarthritis    Past Surgical History  Procedure Laterality Date  . Appendectomy    . Cardiac catheterization    . Gastric bypass  1977     reversed in 1979, [facility]  . Blood clots/legs and lungs  2013  . Mi with stent placement  2004  . Left heart catheterization with coronary angiogram N/A 06/29/2014    Procedure: LEFT HEART CATHETERIZATION WITH CORONARY ANGIOGRAM;  Surgeon: Troy Sine, MD;  Location: [facility] CATH LAB;  Service: Cardiovascular;  Laterality: N/A;  . Coronary angioplasty  2  . Eye surgery Bilateral     cataract   .  Colonoscopy    . Abdominal hysterectomy      partial  . Breast lumpectomy with radioactive seed localization Left 11/05/2014    Procedure: LEFT BREAST LUMPECTOMY WITH RADIOACTIVE SEED LOCALIZATION;  Surgeon: [name], MD;  Location: [place];  Service: General;  Laterality: Left;  . Excision of skin tag Right 11/05/2014    Procedure: EXCISION OF RIGHT EYELID SKIN TAG;  Surgeon: [name], MD;  Location: [place];  Service: General;  Laterality: Right;   Social History:   reports that she has never smoked. She does not have any smokeless tobacco history on file. She reports that she does not drink alcohol or use illicit drugs.  Family History  Problem Relation Age of Onset  . Breast cancer Mother 27  . Heart disease Mother   . Throat cancer Father   . Hypertension Father   . Arthritis Father   . Diabetes Father   . Arthritis Sister   . Obesity Sister   . Diabetes Sister     Medications: Patient's Medications  New Prescriptions   No medications on file  Previous Medications   ACETAMINOPHEN (TYLENOL) 325 MG TABLET    Take 2 tablets (650 mg total) by mouth every 6 (six) hours as needed for mild pain (or Fever >/= 101).   ALBUTEROL (PROVENTIL HFA;VENTOLIN HFA) 108 (90 BASE) MCG/ACT INHALER    Inhale 2 puffs into the lungs every 6 (six) hours as needed for wheezing or shortness of breath.   ALLOPURINOL (ZYLOPRIM) 300 MG TABLET    Take 1 tablet (300 mg total) by mouth daily.   BUDESONIDE-FORMOTEROL (SYMBICORT) 160-4.5 MCG/ACT INHALER    In 2 puffs twice daily to help wheezing   CALCIUM CARBONATE ANTACID (TUMS PO)    Take 3 tablets by mouth daily as needed (acid reflux).   CINNAMON 500 MG CAPSULE    Take 500 mg by mouth 2 (two) times daily.   CLONAZEPAM (KLONOPIN) 1 MG TABLET    Take two tablets at bedtime for sleep   DIPHENHYDRAMINE (BENADRYL) 25 MG TABLET    Take 50 mg by mouth at bedtime. For itching   DULOXETINE (CYMBALTA) 60 MG CAPSULE    Take 1 capsule by mouth every day for  anxiety   FUROSEMIDE (LASIX) 20 MG TABLET    Take one tablet by mouth twice daily for edema   GUAIFENESIN-DEXTROMETHORPHAN (ROBITUSSIN DM) 100-10 MG/5ML SYRUP    Take 5 mLs by mouth every 4 (four) hours as needed for cough.   HYDROCODONE-ACETAMINOPHEN (NORCO) 5-325 MG PER TABLET    Take  1-2 tablets by mouth every 6 (six) hours as needed.   INSULIN GLARGINE (TOUJEO SOLOSTAR) 300 UNIT/ML SOPN    Inject 45 Units into the skin 2 (two) times daily before a meal. Titrate up as needed.   INSULIN LISPRO, HUMAN, (HUMALOG KWIKPEN) 200 UNIT/ML SOPN    Inject 35 Units into the skin 3 (three) times daily before meals.   ISOSORBIDE MONONITRATE (IMDUR) 60 MG 24 HR TABLET    Take 1 tablet (60 mg total) by mouth daily.   LANCETS (ONETOUCH ULTRASOFT) LANCETS    Use to test blood sugar Dx: E11.9   LINACLOTIDE (LINZESS) 145 MCG CAPS CAPSULE    Take 1 capsule (145 mcg total) by mouth daily. Sample provided   MECLIZINE (ANTIVERT) 25 MG TABLET    Take 1 tablet (25 mg total) by mouth 3 (three) times daily.   METOPROLOL SUCCINATE (TOPROL-XL) 25 MG 24 HR TABLET    Take 1 tablet by mouth once every day for blood pressure   NITROGLYCERIN (NITROSTAT) 0.4 MG SL TABLET    Take one tablet under the tongue every 5 minutes as needed for chest pain   OXYCODONE-ACETAMINOPHEN (PERCOCET) 10-325 MG PER TABLET    Take one tablet every 6 hours as needed for pain   PANTOPRAZOLE (PROTONIX) 40 MG TABLET    TAKE ONE TABLET BY MOUTH ONCE DAILY   PRAVASTATIN (PRAVACHOL) 20 MG TABLET    Take 1 tablet by mouth once every day for cholesterol   WARFARIN (COUMADIN) 5 MG TABLET    Take 2 tablets (10 mg total) by mouth daily. Please take 7 mg oral daily on Tues, Thursday, Sat, then 5 mg on Mon, Wed Fri, Sun.   WARFARIN (COUMADIN) 5 MG TABLET    Take 5 mg by mouth See admin instructions. Take 2 tablets (10mg  total) by mouth daily. Please take 7mg  oral daily on Tues, Thursday, Sat, Then 5mg  on Mon wed and fri, sundays  Modified Medications   No  medications on file  Discontinued Medications   No medications on file     Physical Exam:  Filed Vitals:   11/19/14 1604  Weight: 367 lb (166.47 kg)    Physical Exam  Constitutional: She is oriented to person, place, and time. She appears well-developed and well-nourished. No distress.  Obese female  Cardiovascular: Normal rate, regular rhythm and normal heart sounds.   Pulmonary/Chest: Effort normal and breath sounds normal. No respiratory distress.  Abdominal: Soft. Bowel sounds are normal. She exhibits no distension. There is tenderness (throughtout).  Genitourinary: Vagina normal. No erythema in the vagina.  Musculoskeletal: She exhibits no edema or tenderness.  Neurological: She is alert and oriented to person, place, and time.  Skin: Skin is warm and dry. She is not diaphoretic.    Labs reviewed: Basic Metabolic Panel:  Recent Labs  06/25/14 0155  09/24/14 1459 09/25/14 0045 10/29/14 1359  NA  --   < > 136 135 136  K  --   < > 5.3* 5.3* 4.7  CL  --   < > 101 99* 101  CO2  --   < > 25 27 27   GLUCOSE  --   < > 325* 283* 313*  BUN  --   < > 20 22* 16  CREATININE  --   < > 1.28* 1.32* 1.34*  CALCIUM  --   < > 8.9 8.8* 8.7*  TSH 0.733  --   --   --   --   < > =  values in this interval not displayed. Liver Function Tests:  Recent Labs  04/21/14 1251 04/22/14 0323  08/07/14 0933 09/03/14 1149 09/24/14 1459  AST 23 18  < > 19 17 22   ALT 29 21  < > 17 16 21   ALKPHOS 96 105  < > 84 96 84  BILITOT 0.7 0.4  < > <0.2 0.2 0.4  PROT 8.4* 7.3  < > 6.8 7.2 8.4*  ALBUMIN 3.4* 2.7*  --   --   --  2.9*  < > = values in this interval not displayed. No results for input(s): LIPASE, AMYLASE in the last 8760 hours. No results for input(s): AMMONIA in the last 8760 hours. CBC:  Recent Labs  04/23/14 0300  07/08/14 1318  09/24/14 1459 09/25/14 0045 10/29/14 1359  WBC 17.1*  < > 8.2  < > 8.9 10.4 9.4  NEUTROABS 13.6*  --  5.0  --  5.9  --   --   HGB 9.7*  < > 10.6*   < > 10.7* 9.7* 10.0*  HCT 30.8*  < > 33.4*  < > 34.9* 30.9* 32.6*  MCV 86.3  < > 85  < > 85.7 85.1 84.5  PLT 225  < > 312  < > 319 306 305  < > = values in this interval not displayed. Lipid Panel:  Recent Labs  06/04/14 1017  CHOL 171  HDL 41  LDLCALC 85  TRIG 225*  CHOLHDL 4.2   TSH:  Recent Labs  06/25/14 0155  TSH 0.733   A1C: Lab Results  Component Value Date   HGBA1C 10.7* 09/03/2014     Assessment/Plan 1. Slow transit constipation To STOP taking all other constipation medications Take Linzess daily If diarrhea occurs take every other day To make sure she stays well hydrated  2. Sputum production Unchanged, has been going on for 1 year.  Can take mucinex twice daily as needed  3. Abdominal pain, unspecified abdominal location Most likely due to constipation, good bowel sounds. Has not had a BM in 5 days and does not have them frequently. Good appetite. No nausea, vomiting or fever Pt does not want lab work done today Warning signs and follow up precautions discussed-- if fever, chills, nausea or vomiting occurs seek medical attention immediately  Follow up if symptoms do not improve with bowel movements   To keep follow up with Dr Thayer Headings K. Harle Battiest  [facility] [phone] 8 am - 5 pm) [phone] (after hours)

## 2014-11-23 ENCOUNTER — Other Ambulatory Visit: Payer: Self-pay | Admitting: *Deleted

## 2014-11-23 MED ORDER — LINACLOTIDE 145 MCG PO CAPS: 145.0000 ug | ORAL_CAPSULE | Freq: Every day | ORAL | Status: DC

## 2014-11-23 NOTE — Telephone Encounter (Signed)
Patient requested to be faxed to pharmacy 

## 2014-11-24 ENCOUNTER — Ambulatory Visit (INDEPENDENT_AMBULATORY_CARE_PROVIDER_SITE_OTHER): Payer: Medicare PPO | Admitting: Pharmacist Clinician (PhC)/ Clinical Pharmacy Specialist

## 2014-11-24 DIAGNOSIS — Z86711 Personal history of pulmonary embolism: Secondary | ICD-10-CM

## 2014-11-24 DIAGNOSIS — Z5181 Encounter for therapeutic drug level monitoring: Secondary | ICD-10-CM

## 2014-11-24 DIAGNOSIS — Z86718 Personal history of other venous thrombosis and embolism: Secondary | ICD-10-CM

## 2014-11-24 LAB — POCT INR: INR: 1.8

## 2014-11-29 ENCOUNTER — Other Ambulatory Visit: Payer: Self-pay | Admitting: Nurse Practitioner

## 2014-12-03 ENCOUNTER — Other Ambulatory Visit: Payer: Self-pay | Admitting: Internal Medicine

## 2014-12-03 ENCOUNTER — Other Ambulatory Visit: Payer: Medicare PPO

## 2014-12-03 DIAGNOSIS — E1165 Type 2 diabetes mellitus with hyperglycemia: Principal | ICD-10-CM

## 2014-12-03 DIAGNOSIS — M1A9XX1 Chronic gout, unspecified, with tophus (tophi): Secondary | ICD-10-CM

## 2014-12-03 DIAGNOSIS — E1129 Type 2 diabetes mellitus with other diabetic kidney complication: Secondary | ICD-10-CM

## 2014-12-03 DIAGNOSIS — IMO0002 Reserved for concepts with insufficient information to code with codable children: Secondary | ICD-10-CM

## 2014-12-03 DIAGNOSIS — I10 Essential (primary) hypertension: Secondary | ICD-10-CM

## 2014-12-04 ENCOUNTER — Encounter: Payer: Self-pay | Admitting: Internal Medicine

## 2014-12-04 ENCOUNTER — Ambulatory Visit (INDEPENDENT_AMBULATORY_CARE_PROVIDER_SITE_OTHER): Payer: Medicare PPO | Admitting: Internal Medicine

## 2014-12-04 VITALS — BP 110/82 | HR 79 | Temp 97.9°F | Resp 20 | Ht 67.0 in

## 2014-12-04 DIAGNOSIS — N189 Chronic kidney disease, unspecified: Secondary | ICD-10-CM | POA: Diagnosis not present

## 2014-12-04 DIAGNOSIS — M1A9XX1 Chronic gout, unspecified, with tophus (tophi): Secondary | ICD-10-CM

## 2014-12-04 DIAGNOSIS — B372 Candidiasis of skin and nail: Secondary | ICD-10-CM | POA: Diagnosis not present

## 2014-12-04 DIAGNOSIS — E1165 Type 2 diabetes mellitus with hyperglycemia: Secondary | ICD-10-CM | POA: Diagnosis not present

## 2014-12-04 DIAGNOSIS — C50912 Malignant neoplasm of unspecified site of left female breast: Secondary | ICD-10-CM

## 2014-12-04 DIAGNOSIS — I5032 Chronic diastolic (congestive) heart failure: Secondary | ICD-10-CM | POA: Diagnosis not present

## 2014-12-04 DIAGNOSIS — E1122 Type 2 diabetes mellitus with diabetic chronic kidney disease: Secondary | ICD-10-CM

## 2014-12-04 DIAGNOSIS — I10 Essential (primary) hypertension: Secondary | ICD-10-CM | POA: Diagnosis not present

## 2014-12-04 DIAGNOSIS — IMO0002 Reserved for concepts with insufficient information to code with codable children: Secondary | ICD-10-CM

## 2014-12-04 LAB — COMPREHENSIVE METABOLIC PANEL
ALT: 20 IU/L (ref 0–32)
AST: 21 IU/L (ref 0–40)
Albumin/Globulin Ratio: 0.9 — ABNORMAL LOW (ref 1.1–2.5)
Albumin: 3.5 g/dL (ref 3.5–4.8)
Alkaline Phosphatase: 88 IU/L (ref 39–117)
BUN/Creatinine Ratio: 18 (ref 11–26)
BUN: 26 mg/dL (ref 8–27)
Bilirubin Total: <0.2 mg/dL (ref 0.0–1.2)
CO2: 21 mmol/L (ref 18–29)
Calcium: 8.4 mg/dL — ABNORMAL LOW (ref 8.7–10.3)
Chloride: 97 mmol/L (ref 97–108)
Creatinine, Ser: 1.43 mg/dL — ABNORMAL HIGH (ref 0.57–1.00)
GFR calc Af Amer: 40 mL/min/{1.73_m2} — ABNORMAL LOW (ref >59)
GFR calc non Af Amer: 35 mL/min/{1.73_m2} — ABNORMAL LOW (ref >59)
Globulin, Total: 3.8 g/dL (ref 1.5–4.5)
Glucose: 173 mg/dL — ABNORMAL HIGH (ref 65–99)
Potassium: 4.3 mmol/L (ref 3.5–5.2)
Sodium: 140 mmol/L (ref 134–144)
Total Protein: 7.3 g/dL (ref 6.0–8.5)

## 2014-12-04 LAB — HEMOGLOBIN A1C
Est. average glucose Bld gHb Est-mCnc: 255 mg/dL
Hgb A1c MFr Bld: 10.5 % — ABNORMAL HIGH (ref 4.8–5.6)

## 2014-12-04 LAB — URIC ACID: Uric Acid: 8.6 mg/dL — ABNORMAL HIGH (ref 2.5–7.1)

## 2014-12-04 MED ORDER — FLUCONAZOLE 150 MG PO TABS: 150.0000 mg | ORAL_TABLET | Freq: Once | ORAL | Status: DC

## 2014-12-04 NOTE — Progress Notes (Signed)
Patient ID: [name], female   DOB: Sep 24, 1934, 80 y.o.   MRN: [id number]    Location:    [name]   Place of Service:   OFFICE  Chief Complaint  Patient presents with  . Medical Management of Chronic Issues    2 month follow-up    HPI:  80 yo female seen today for f/u. She is now able to afford her toujeo. BS are fluctuating at home. No low BS reactions. she does not take insulin as Rx as she does not eat 3 meals per day. She has constipation and is out of linzess. She purchased OTC dulcolax yesterday.   She did not receive a lightweight w/c yet. She is awaiting approval from insurance company. Her current chair is very heavy and needs to be replaced  She was dx with left breast CA and is s/p lumpectomy. No XRT or chemotx required. She prefers to NOT know about her breast cancer dx  She plans to have vaginal polyp removed in next few weeks. She continues to have a bloody d/c.   She has trouble exercising due to morbid obesity. She tries to watch caloric intake. She eats only 1 good meal per day. She is unable to prepare her own meals and depends upon others to help her. She is w/c bound. Pain controlled on percocet  No issues with exacerbations of heart failure. She takes isosorbide mononitrate, SLNTG, statin, lasix and BB.  She takes life long coumadin for hx blood clots  She c/o vaginal d/c and will see GYN in next few weeks. She also has itching in folds of her skin and requests cream to help her.  Mood is controlled on cymbalta and klonopin  She stopped allopurinol due to cost. No gout attacks  Past Medical History  Diagnosis Date  . Hyperlipidemia     takes Pravastatin daily  . Coronary artery disease     a. s/p IMI 2004 tx with BMS to RCA;  b. s/p Promus DES to RCA 2/12 (cath: LM 20-30%, pLAD 20-30%, mLAD 50%, RI 30%, pRCA 60%, mRCA 90% - tx with PCI);  c. myoview 1/07: EF 49%, inf scar, no isch  . Urinary incontinence     takes Linzess daily  . Insomnia   .  Osteoarthritis   . Obstructive sleep apnea   . Polymyalgia rheumatica   . Arthritis   . Pulmonary emboli 9/13    felt to need lifelong anticoagulation  . Allergy     takes Mucinex daily as needed  . Gout, unspecified     takes Colchicine daily  . Dysuria   . Pain, chronic   . Osteoarthrosis, unspecified whether generalized or localized, lower leg   . Anemia, unspecified   . Coronary atherosclerosis of native coronary artery   . CHF (congestive heart failure)     takes Furosemide daily  . Depression     takes Cymbalta daily  . Anxiety     takes Clonazepam daily as needed  . Hypertension     takes Imdur and Metoprolol daily  . GERD (gastroesophageal reflux disease)     takes Protonix daily  . Diabetes mellitus     insulin daily  . Type II or unspecified type diabetes mellitus without mention of complication, uncontrolled   . Myocardial infarction 2004  . History of blood clots about 41yr ago    in legs-takes Coumadin   . Shortness of breath dyspnea     with exertion and has Albuterol  inhaler prn  . Headache     occasionally  . Vertigo     hx of;was taking Meclizine if needed  . Numbness   . Joint pain   . Joint swelling   . Osteoarthritis     Past Surgical History  Procedure Laterality Date  . Appendectomy    . Cardiac catheterization    . Gastric bypass  1977     reversed in 1979, [facility]  . Blood clots/legs and lungs  2013  . Mi with stent placement  2004  . Left heart catheterization with coronary angiogram N/A 06/29/2014    Procedure: LEFT HEART CATHETERIZATION WITH CORONARY ANGIOGRAM;  Surgeon: Troy Sine, MD;  Location: [facility] CATH LAB;  Service: Cardiovascular;  Laterality: N/A;  . Coronary angioplasty  2  . Eye surgery Bilateral     cataract   . Colonoscopy    . Abdominal hysterectomy      partial  . Breast lumpectomy with radioactive seed localization Left 11/05/2014    Procedure: LEFT BREAST LUMPECTOMY WITH RADIOACTIVE SEED LOCALIZATION;   Surgeon: [name], MD;  Location: [place];  Service: General;  Laterality: Left;  . Excision of skin tag Right 11/05/2014    Procedure: EXCISION OF RIGHT EYELID SKIN TAG;  Surgeon: [name], MD;  Location: [place];  Service: General;  Laterality: Right;    Patient Care Team: Gildardo Cranker, DO as PCP - General (Internal Medicine) Renella Cunas, MD as Consulting Physician (Cardiology) [name], MD as Consulting Physician (Ophthalmology) [name], MD as Consulting Physician (General Surgery)  History   Social History  . Marital Status: Widowed    Spouse Name: N/A  . Number of Children: N/A  . Years of Education: N/A   Occupational History  . retired    Social History Main Topics  . Smoking status: Never Smoker   . Smokeless tobacco: Not on file  . Alcohol Use: No  . Drug Use: No  . Sexual Activity: Not Currently   Other Topics Concern  . Not on file   Social History Narrative     reports that she has never smoked. She does not have any smokeless tobacco history on file. She reports that she does not drink alcohol or use illicit drugs.  Allergies  Allergen Reactions  . Sulfonamide Derivatives Swelling    Mouth swelling  . Tramadol Nausea And Vomiting    Medications: Patient's Medications  New Prescriptions   No medications on file  Previous Medications   ACETAMINOPHEN (TYLENOL) 325 MG TABLET    Take 2 tablets (650 mg total) by mouth every 6 (six) hours as needed for mild pain (or Fever >/= 101).   ALBUTEROL (PROVENTIL HFA;VENTOLIN HFA) 108 (90 BASE) MCG/ACT INHALER    Inhale 2 puffs into the lungs every 6 (six) hours as needed for wheezing or shortness of breath.   ALLOPURINOL (ZYLOPRIM) 300 MG TABLET    Take 1 tablet (300 mg total) by mouth daily.   BUDESONIDE-FORMOTEROL (SYMBICORT) 160-4.5 MCG/ACT INHALER    In 2 puffs twice daily to help wheezing   CALCIUM CARBONATE ANTACID (TUMS PO)    Take 3 tablets by mouth daily as needed (acid reflux).     CINNAMON 500 MG CAPSULE    Take 500 mg by mouth 2 (two) times daily.   CLONAZEPAM (KLONOPIN) 1 MG TABLET    TAKE TWO TABLETS BY MOUTH AT BEDTIME FOR SLEEP   DIPHENHYDRAMINE (BENADRYL) 25 MG TABLET    Take 50  mg by mouth at bedtime. For itching   DULOXETINE (CYMBALTA) 60 MG CAPSULE    Take 1 capsule by mouth every day for anxiety   FUROSEMIDE (LASIX) 20 MG TABLET    Take one tablet by mouth twice daily for edema   GUAIFENESIN-DEXTROMETHORPHAN (ROBITUSSIN DM) 100-10 MG/5ML SYRUP    Take 5 mLs by mouth every 4 (four) hours as needed for cough.   HYDROCODONE-ACETAMINOPHEN (NORCO) 5-325 MG PER TABLET    Take 1-2 tablets by mouth every 6 (six) hours as needed.   INSULIN GLARGINE (TOUJEO SOLOSTAR) 300 UNIT/ML SOPN    Inject 45 Units into the skin 2 (two) times daily before a meal. Titrate up as needed.   INSULIN LISPRO, HUMAN, (HUMALOG KWIKPEN) 200 UNIT/ML SOPN    Inject 35 Units into the skin 3 (three) times daily before meals.   ISOSORBIDE MONONITRATE (IMDUR) 60 MG 24 HR TABLET    Take 1 tablet (60 mg total) by mouth daily.   LANCETS (ONETOUCH ULTRASOFT) LANCETS    Use to test blood sugar Dx: E11.9   LINACLOTIDE (LINZESS) 145 MCG CAPS CAPSULE    Take 1 capsule (145 mcg total) by mouth daily.   MECLIZINE (ANTIVERT) 25 MG TABLET    Take 1 tablet (25 mg total) by mouth 3 (three) times daily.   METOPROLOL SUCCINATE (TOPROL-XL) 25 MG 24 HR TABLET    Take 1 tablet by mouth once every day for blood pressure   NITROGLYCERIN (NITROSTAT) 0.4 MG SL TABLET    Take one tablet under the tongue every 5 minutes as needed for chest pain   OXYCODONE-ACETAMINOPHEN (PERCOCET) 10-325 MG PER TABLET    Take one tablet every 6 hours as needed for pain   PANTOPRAZOLE (PROTONIX) 40 MG TABLET    TAKE ONE TABLET BY MOUTH ONCE DAILY   PRAVASTATIN (PRAVACHOL) 20 MG TABLET    TAKE ONE TABLET BY MOUTH ONCE DAILY FOR  CHOLESTEROL   WARFARIN (COUMADIN) 5 MG TABLET    Take 2 tablets (10 mg total) by mouth daily. Please take 7 mg oral  daily on Tues, Thursday, Sat, then 5 mg on Mon, Wed Fri, Sun.   WARFARIN (COUMADIN) 5 MG TABLET    Take 5 mg by mouth See admin instructions. Take 2 tablets (49m total) by mouth daily. Please take 733moral daily on Tues, Thursday, Sat, Then 57m36mn Mon wed and fri, sundays  Modified Medications   No medications on file  Discontinued Medications   PRAVASTATIN (PRAVACHOL) 20 MG TABLET    Take 1 tablet by mouth once every day for cholesterol    Review of Systems  Constitutional: Positive for activity change. Negative for fever, chills, diaphoresis, appetite change and fatigue.  HENT: Negative for ear pain and sore throat.   Eyes: Negative for visual disturbance.  Respiratory: Positive for shortness of breath. Negative for cough and chest tightness.   Cardiovascular: Negative for chest pain, palpitations and leg swelling.  Gastrointestinal: Negative for nausea, vomiting, abdominal pain, diarrhea, constipation and blood in stool.  Genitourinary: Positive for vaginal bleeding and vaginal discharge. Negative for dysuria.  Musculoskeletal: Positive for back pain, arthralgias and gait problem.  Neurological: Negative for dizziness, tremors, numbness and headaches.  Psychiatric/Behavioral: Positive for dysphoric mood. Negative for sleep disturbance. The patient is not nervous/anxious.     Filed Vitals:   12/04/14 1528  BP: 110/82  Pulse: 79  Temp: 97.9 F (36.6 C)  TempSrc: Oral  Resp: 20  Height: '5\' 7"'  (1.702 m)  SpO2: 98%   There is no weight on file to calculate BMI.  Physical Exam  Constitutional: She is oriented to person, place, and time. She appears well-developed and well-nourished. No distress.  Looks uncomfortable in NAD. Sitting in w/c. Grandson present  HENT:  Mouth/Throat: Oropharynx is clear and moist. No oropharyngeal exudate.  Eyes: Pupils are equal, round, and reactive to light. No scleral icterus.  Neck: Neck supple. Carotid bruit is not present. No tracheal deviation  present. No thyromegaly present.  Cardiovascular: Normal rate, regular rhythm, normal heart sounds and intact distal pulses.  Exam reveals no gallop and no friction rub.   No murmur heard. No LE edema b/l. no calf TTP.   Pulmonary/Chest: Effort normal. No stridor. No respiratory distress. She has decreased breath sounds. She has no wheezes. She has no rales.  Left breast medial areolar incisional scar present. NT and no redness or d/c  Abdominal: Soft. Bowel sounds are normal. She exhibits no distension and no mass. There is no hepatomegaly. There is no tenderness. There is no rebound and no guarding.  Musculoskeletal: She exhibits edema (ankle swelling and pain) and tenderness.  Lymphadenopathy:    She has no cervical adenopathy.  Neurological: She is alert and oriented to person, place, and time.  Skin: Skin is warm and dry. Rash (intertriguinous rash) noted.  Psychiatric: Her behavior is normal. Judgment and thought content normal. She exhibits a depressed mood.   Diabetic Foot Exam - Simple   Simple Foot Form  Diabetic Foot exam was performed with the following findings:  Yes 12/04/2014  1:53 PM  Visual Inspection  See comments:  Yes  Sensation Testing  See comments:  Yes  Pulse Check  Posterior Tibialis and Dorsalis pulse intact bilaterally:  Yes  Comments  Calluses and b/l bunion present. No ulcerations. Monofilament testing reduced R>L       Labs reviewed: Appointment on 12/03/2014  Component Date Value Ref Range Status  . Hgb A1c MFr Bld 12/03/2014 10.5* 4.8 - 5.6 % Final   Comment:          Pre-diabetes: 5.7 - 6.4          Diabetes: >6.4          Glycemic control for adults with diabetes: <7.0   . Est. average glucose Bld gHb Est-m* 12/03/2014 255   Final  . Glucose 12/03/2014 173* 65 - 99 mg/dL Final  . BUN 12/03/2014 26  8 - 27 mg/dL Final  . Creatinine, Ser 12/03/2014 1.43* 0.57 - 1.00 mg/dL Final  . GFR calc non Af Amer 12/03/2014 35* >59 mL/min/1.73 Final  . GFR  calc Af Amer 12/03/2014 40* >59 mL/min/1.73 Final  . BUN/Creatinine Ratio 12/03/2014 18  11 - 26 Final  . Sodium 12/03/2014 140  134 - 144 mmol/L Final  . Potassium 12/03/2014 4.3  3.5 - 5.2 mmol/L Final  . Chloride 12/03/2014 97  97 - 108 mmol/L Final  . CO2 12/03/2014 21  18 - 29 mmol/L Final  . Calcium 12/03/2014 8.4* 8.7 - 10.3 mg/dL Final  . Total Protein 12/03/2014 7.3  6.0 - 8.5 g/dL Final  . Albumin 12/03/2014 3.5  3.5 - 4.8 g/dL Final  . Globulin, Total 12/03/2014 3.8  1.5 - 4.5 g/dL Final  . Albumin/Globulin Ratio 12/03/2014 0.9* 1.1 - 2.5 Final  . Bilirubin Total 12/03/2014 <0.2  0.0 - 1.2 mg/dL Final  . Alkaline Phosphatase 12/03/2014 88  39 - 117 IU/L Final  . AST 12/03/2014 21  0 - 40 IU/L Final  . ALT 12/03/2014 20  0 - 32 IU/L Final  . Uric Acid 12/03/2014 8.6* 2.5 - 7.1 mg/dL Final              Therapeutic target for gout patients: <6.0  Anti-coag visit on 11/24/2014  Component Date Value Ref Range Status  . INR 11/24/2014 1.8   Final  Anti-coag visit on 11/17/2014  Component Date Value Ref Range Status  . INR 11/17/2014 1.9   Final   [facility]  Admission on 11/05/2014, Discharged on 11/05/2014  Component Date Value Ref Range Status  . Prothrombin Time 11/05/2014 14.7  11.6 - 15.2 seconds Final  . INR 11/05/2014 1.13  0.00 - 1.49 Final  . Glucose-Capillary 11/05/2014 377* 65 - 99 mg/dL Final  . Glucose-Capillary 11/05/2014 369* 65 - 99 mg/dL Final  . Comment 1 11/05/2014 Notify RN   Final  . Comment 2 11/05/2014 Document in Chart   Final  . Glucose-Capillary 11/05/2014 284* 65 - 99 mg/dL Final  . Comment 1 11/05/2014 Notify RN   Final  . Comment 2 11/05/2014 Document in Chart   Final  Anti-coag visit on 10/30/2014  Component Date Value Ref Range Status  . INR 10/30/2014 1.6   Final   [facility] Outpatient Visit on 10/29/2014  Component Date Value Ref Range Status  . Glucose-Capillary 10/29/2014 275* 65 - 99 mg/dL Final  . Sodium 10/29/2014 136  135 - 145 mmol/L  Final  . Potassium 10/29/2014 4.7  3.5 - 5.1 mmol/L Final  . Chloride 10/29/2014 101  101 - 111 mmol/L Final  . CO2 10/29/2014 27  22 - 32 mmol/L Final  . Glucose, Bld 10/29/2014 313* 65 - 99 mg/dL Final  . BUN 10/29/2014 16  6 - 20 mg/dL Final  . Creatinine, Ser 10/29/2014 1.34* 0.44 - 1.00 mg/dL Final  . Calcium 10/29/2014 8.7* 8.9 - 10.3 mg/dL Final  . GFR calc non Af Amer 10/29/2014 37* >60 mL/min Final  . GFR calc Af Amer 10/29/2014 43* >60 mL/min Final   Comment: (NOTE) The eGFR has been calculated using the CKD EPI equation. This calculation has not been validated in all clinical situations. eGFR's persistently <60 mL/min signify possible Chronic Kidney Disease.   . Anion gap 10/29/2014 8  5 - 15 Final  . WBC 10/29/2014 9.4  4.0 - 10.5 K/uL Final  . RBC 10/29/2014 3.86* 3.87 - 5.11 MIL/uL Final  . Hemoglobin 10/29/2014 10.0* 12.0 - 15.0 g/dL Final  . HCT 10/29/2014 32.6* 36.0 - 46.0 % Final  . MCV 10/29/2014 84.5  78.0 - 100.0 fL Final  . MCH 10/29/2014 25.9* 26.0 - 34.0 pg Final  . MCHC 10/29/2014 30.7  30.0 - 36.0 g/dL Final  . RDW 10/29/2014 15.9* 11.5 - 15.5 % Final  . Platelets 10/29/2014 305  150 - 400 K/uL Final  Anti-coag visit on 10/23/2014  Component Date Value Ref Range Status  . INR 10/23/2014 2.1   Final   Donita, RN, [facility]  Anti-coag visit on 10/15/2014  Component Date Value Ref Range Status  . INR 10/15/2014 1.7   Final   [facility]  Appointment on 10/13/2014  Component Date Value Ref Range Status  . INR 10/13/2014 1.4* 0.8 - 1.2 Final   Comment: Reference interval is for non-anticoagulated patients. Suggested INR therapeutic range for Vitamin K antagonist therapy:    Standard Dose (moderate intensity  therapeutic range):       2.0 - 3.0    Higher intensity therapeutic range       2.5 - 3.5   . Prothrombin Time 10/13/2014 14.8* 9.1 - 12.0 sec Final  Anti-coag visit on 10/08/2014  Component Date Value Ref Range Status  . INR 10/08/2014 1.9    Final   [facility]  There may be more visits with results that are not included.  Mm Breast Surgical Specimen  11/05/2014   CLINICAL DATA:  Post surgical excision of the left breast.  EXAM: SPECIMEN RADIOGRAPH OF THE LEFT BREAST  COMPARISON:  Previous exam(s).  FINDINGS: Status post excision of the left breast. The cylindrical shaped radioactive seed and biopsy marker clip are present, completely intact, and were marked for pathology. The ribbon shaped biopsy marking clip is not present in the surgical specimen. This was discussed with the Dr. Pryor Montes nurse in the [place] who stated he is aware of this and fine with leaving the ribbon shaped marking clip within the breast.  IMPRESSION: Specimen radiograph of the left breast.   Electronically Signed   By: Everlean Alstrom M.D.   On: 11/05/2014 09:02     Assessment/Plan   ICD-9-CM ICD-10-CM   1. Uncontrolled diabetes mellitus with chronic kidney disease - with hyperglycemia 250.42 E11.22    585.9 E11.65     N18.9   2. Yeast dermatitis - due to #1 112.3 B37.2 fluconazole (DIFLUCAN) 150 MG tablet  3. Gout with tophus, unspecified cause, unspecified chronicity, unspecified site - uncontrolled as she stopped med 274.82 [id number]   4. Essential hypertension - stable 401.9 I10   5. OBESITY, MORBID - failing to change as expected 278.01 E66.01   6. Chronic diastolic congestive heart failure - stable 428.32 I50.32    428.0    7. Invasive ductal carcinoma of breast, left - s/p lumpectomy 174.9 C50.912     --check Blood Sugar prior to meals. If > 150, give 5 units. No insulin if <150. Continue 35 units with noon meal.  --Continue other medications as ordered  --Follow up with specialists as scheduled  --Follow up in 2 mos for routine visit  Paytyn Mesta S. Perlie Gold  [facility] [address] [place], [place] [address] [phone] Cell (Monday-Friday 8 AM - 5 PM) [phone] After 5 PM and follow  prompts

## 2014-12-04 NOTE — Patient Instructions (Signed)
check Blood Sugar prior to meals. If > 150, give 5 units. No insulin if <150. Continue 35 units with noon meal.  Continue other medications as ordered  Follow up with specialists as scheduled  Follow up in 2 mos for routine visit

## 2014-12-07 ENCOUNTER — Other Ambulatory Visit: Payer: Self-pay | Admitting: Internal Medicine

## 2014-12-07 ENCOUNTER — Telehealth: Payer: Self-pay | Admitting: Cardiology

## 2014-12-07 NOTE — Telephone Encounter (Signed)
New message      Did we receive the [facility] home care order skilled nursing----order number is [id number]. The starting date is 10-15-14.  Please fax to [phone].  Please call if you did not receive it.

## 2014-12-07 NOTE — Telephone Encounter (Signed)
Order was located and placed in Dr [employer] box for signature.  They are aware she will be back in the office tomorrow and can hopefully sign it then.

## 2014-12-10 ENCOUNTER — Ambulatory Visit (INDEPENDENT_AMBULATORY_CARE_PROVIDER_SITE_OTHER): Payer: Medicare PPO | Admitting: *Deleted

## 2014-12-10 DIAGNOSIS — Z5181 Encounter for therapeutic drug level monitoring: Secondary | ICD-10-CM

## 2014-12-10 DIAGNOSIS — Z86711 Personal history of pulmonary embolism: Secondary | ICD-10-CM | POA: Diagnosis not present

## 2014-12-10 DIAGNOSIS — I2699 Other pulmonary embolism without acute cor pulmonale: Secondary | ICD-10-CM | POA: Diagnosis not present

## 2014-12-10 DIAGNOSIS — Z7901 Long term (current) use of anticoagulants: Secondary | ICD-10-CM | POA: Diagnosis not present

## 2014-12-10 DIAGNOSIS — Z86718 Personal history of other venous thrombosis and embolism: Secondary | ICD-10-CM

## 2014-12-10 LAB — POCT INR: INR: 3.9

## 2014-12-14 ENCOUNTER — Ambulatory Visit (INDEPENDENT_AMBULATORY_CARE_PROVIDER_SITE_OTHER): Payer: Medicare PPO | Admitting: Pharmacotherapy

## 2014-12-14 ENCOUNTER — Encounter: Payer: Self-pay | Admitting: Pharmacotherapy

## 2014-12-14 VITALS — BP 120/80 | HR 97 | Temp 96.6°F | Resp 20 | Ht 67.0 in | Wt 359.8 lb

## 2014-12-14 DIAGNOSIS — I10 Essential (primary) hypertension: Secondary | ICD-10-CM

## 2014-12-14 DIAGNOSIS — G629 Polyneuropathy, unspecified: Secondary | ICD-10-CM

## 2014-12-14 DIAGNOSIS — I2782 Chronic pulmonary embolism: Secondary | ICD-10-CM | POA: Diagnosis not present

## 2014-12-14 DIAGNOSIS — E785 Hyperlipidemia, unspecified: Secondary | ICD-10-CM | POA: Diagnosis not present

## 2014-12-14 DIAGNOSIS — E1342 Other specified diabetes mellitus with diabetic polyneuropathy: Secondary | ICD-10-CM | POA: Diagnosis not present

## 2014-12-14 DIAGNOSIS — G4700 Insomnia, unspecified: Secondary | ICD-10-CM | POA: Diagnosis not present

## 2014-12-14 DIAGNOSIS — E1142 Type 2 diabetes mellitus with diabetic polyneuropathy: Secondary | ICD-10-CM

## 2014-12-14 DIAGNOSIS — E1169 Type 2 diabetes mellitus with other specified complication: Secondary | ICD-10-CM

## 2014-12-14 DIAGNOSIS — G894 Chronic pain syndrome: Secondary | ICD-10-CM | POA: Diagnosis not present

## 2014-12-14 LAB — POCT INR: INR: 3.1

## 2014-12-14 MED ORDER — TEMAZEPAM 30 MG PO CAPS: 30.0000 mg | ORAL_CAPSULE | Freq: Every evening | ORAL | Status: DC | PRN

## 2014-12-14 MED ORDER — PRAVASTATIN SODIUM 20 MG PO TABS: ORAL_TABLET | ORAL | Status: DC

## 2014-12-14 MED ORDER — DULOXETINE HCL 60 MG PO CPEP: ORAL_CAPSULE | ORAL | Status: DC

## 2014-12-14 MED ORDER — METOPROLOL SUCCINATE ER 25 MG PO TB24: ORAL_TABLET | ORAL | Status: DC

## 2014-12-14 MED ORDER — FUROSEMIDE 20 MG PO TABS: ORAL_TABLET | ORAL | Status: DC

## 2014-12-14 MED ORDER — WARFARIN SODIUM 5 MG PO TABS: ORAL_TABLET | ORAL | Status: DC

## 2014-12-14 MED ORDER — OXYCODONE-ACETAMINOPHEN 10-325 MG PO TABS
ORAL_TABLET | ORAL | Status: DC
Start: 2014-12-14 — End: 2015-01-19

## 2014-12-14 NOTE — Patient Instructions (Signed)
INR 3.1 Continue Coumadin 7.5mg  daily except 5mg  on Mondays and Fridays

## 2014-12-14 NOTE — Progress Notes (Signed)
   Subjective:    Patient ID: [name], female    DOB: [birth year], 80 y.o.   MRN: [id number]  HPI Last INR on 12/10/14 was high at 3.9 due to fluconazole Current Coumadin dose is 7.5mg  QD except 5mg  M/F Denies missed doses. Denies unusual bleeding or bruising. Denies CP, falls Has chronic SOB Consistent with vitamin K intake   Review of Systems  HENT: Negative for nosebleeds.   Respiratory: Positive for shortness of breath.   Cardiovascular: Positive for leg swelling. Negative for chest pain.  Gastrointestinal: Negative for anal bleeding.  Genitourinary: Negative for hematuria.  Hematological: Does not bruise/bleed easily.       Objective:   Physical Exam  Constitutional: She is oriented to person, place, and time. She appears well-developed and well-nourished.  HENT:  Right Ear: External ear normal.  Left Ear: External ear normal.  Cardiovascular: Normal rate, regular rhythm and normal heart sounds.   Pulmonary/Chest: Effort normal and breath sounds normal.  Neurological: She is alert and oriented to person, place, and time.  Skin: Skin is warm and dry.  Psychiatric: She has a normal mood and affect. Her behavior is normal. Judgment and thought content normal.  Vitals reviewed.   BP:  120/80  HR:  97  Wt:  359lb INR 3.1      Assessment & Plan:  1.  INR close to goal 2-3 2.  Continue Coumadin 7.5mg  QD except 5mg  M/F 3.  RTC 4 weeks

## 2014-12-16 ENCOUNTER — Telehealth: Payer: Self-pay | Admitting: Hematology

## 2014-12-16 ENCOUNTER — Encounter: Payer: Self-pay | Admitting: Hematology

## 2014-12-16 ENCOUNTER — Ambulatory Visit (HOSPITAL_BASED_OUTPATIENT_CLINIC_OR_DEPARTMENT_OTHER): Payer: Medicare PPO | Admitting: Hematology

## 2014-12-16 ENCOUNTER — Other Ambulatory Visit: Payer: Self-pay | Admitting: *Deleted

## 2014-12-16 ENCOUNTER — Encounter: Payer: Self-pay | Admitting: *Deleted

## 2014-12-16 ENCOUNTER — Ambulatory Visit (HOSPITAL_BASED_OUTPATIENT_CLINIC_OR_DEPARTMENT_OTHER): Payer: Medicare PPO

## 2014-12-16 ENCOUNTER — Ambulatory Visit: Payer: Medicare PPO

## 2014-12-16 ENCOUNTER — Other Ambulatory Visit: Payer: Medicare PPO

## 2014-12-16 VITALS — BP 155/79 | HR 102 | Temp 97.7°F | Resp 18 | Ht 67.0 in | Wt 360.4 lb

## 2014-12-16 DIAGNOSIS — Z170 Estrogen receptor positive status [ER+]: Secondary | ICD-10-CM

## 2014-12-16 DIAGNOSIS — N183 Chronic kidney disease, stage 3 unspecified: Secondary | ICD-10-CM

## 2014-12-16 DIAGNOSIS — C50912 Malignant neoplasm of unspecified site of left female breast: Secondary | ICD-10-CM | POA: Diagnosis not present

## 2014-12-16 DIAGNOSIS — E559 Vitamin D deficiency, unspecified: Secondary | ICD-10-CM

## 2014-12-16 DIAGNOSIS — N939 Abnormal uterine and vaginal bleeding, unspecified: Secondary | ICD-10-CM

## 2014-12-16 DIAGNOSIS — M8580 Other specified disorders of bone density and structure, unspecified site: Secondary | ICD-10-CM

## 2014-12-16 DIAGNOSIS — Z6841 Body Mass Index (BMI) 40.0 and over, adult: Secondary | ICD-10-CM

## 2014-12-16 DIAGNOSIS — E119 Type 2 diabetes mellitus without complications: Secondary | ICD-10-CM | POA: Diagnosis not present

## 2014-12-16 LAB — CBC & DIFF AND RETIC
BASO%: 0.3 % (ref 0.0–2.0)
Basophils Absolute: 0 10*3/uL (ref 0.0–0.1)
EOS%: 3.4 % (ref 0.0–7.0)
Eosinophils Absolute: 0.2 10*3/uL (ref 0.0–0.5)
HCT: 32.9 % — ABNORMAL LOW (ref 34.8–46.6)
HGB: 10.4 g/dL — ABNORMAL LOW (ref 11.6–15.9)
Immature Retic Fract: 10.9 % — ABNORMAL HIGH (ref 1.60–10.00)
LYMPH%: 38.8 % (ref 14.0–49.7)
MCH: 26.5 pg (ref 25.1–34.0)
MCHC: 31.6 g/dL (ref 31.5–36.0)
MCV: 83.9 fL (ref 79.5–101.0)
MONO#: 0.6 10*3/uL (ref 0.1–0.9)
MONO%: 7.8 % (ref 0.0–14.0)
NEUT#: 3.6 10*3/uL (ref 1.5–6.5)
NEUT%: 49.7 % (ref 38.4–76.8)
Platelets: 251 10*3/uL (ref 145–400)
RBC: 3.92 10*6/uL (ref 3.70–5.45)
RDW: 15.9 % — ABNORMAL HIGH (ref 11.2–14.5)
Retic %: 1.32 % (ref 0.70–2.10)
Retic Ct Abs: 51.74 10*3/uL (ref 33.70–90.70)
WBC: 7.1 10*3/uL (ref 3.9–10.3)
lymph#: 2.8 10*3/uL (ref 0.9–3.3)

## 2014-12-16 LAB — COMPREHENSIVE METABOLIC PANEL (CC13)
ALT: 24 U/L (ref 0–55)
AST: 21 U/L (ref 5–34)
Albumin: 3.2 g/dL — ABNORMAL LOW (ref 3.5–5.0)
Alkaline Phosphatase: 99 U/L (ref 40–150)
Anion Gap: 7 mEq/L (ref 3–11)
BUN: 21.4 mg/dL (ref 7.0–26.0)
CO2: 26 mEq/L (ref 22–29)
Calcium: 8.6 mg/dL (ref 8.4–10.4)
Chloride: 102 mEq/L (ref 98–109)
Creatinine: 1.4 mg/dL — ABNORMAL HIGH (ref 0.6–1.1)
EGFR: 42 mL/min/{1.73_m2} — ABNORMAL LOW (ref >90)
Glucose: 294 mg/dl — ABNORMAL HIGH (ref 70–140)
Potassium: 4.5 mEq/L (ref 3.5–5.1)
Sodium: 135 mEq/L — ABNORMAL LOW (ref 136–145)
Total Bilirubin: 0.4 mg/dL (ref 0.20–1.20)
Total Protein: 8 g/dL (ref 6.4–8.3)

## 2014-12-16 MED ORDER — ANASTROZOLE 1 MG PO TABS
1.0000 mg | ORAL_TABLET | Freq: Every day | ORAL | Status: DC
Start: 2014-12-16 — End: 2015-01-20

## 2014-12-16 NOTE — Telephone Encounter (Signed)
Pt confirmed labs/ov per 08/10 POF, gave pt avs and calendar... KJ °

## 2014-12-16 NOTE — Progress Notes (Signed)
.    Hematology oncology consultation note  Date of service 12/16/2014   Patient Care Team: Gildardo Cranker, DO as PCP - General (Internal Medicine) Renella Cunas, MD as Consulting Physician (Cardiology) [name], MD as Consulting Physician (Ophthalmology) [name], MD as Consulting Physician (General Surgery)  CHIEF COMPLAINTS/PURPOSE OF CONSULTATION:  Newly diagnosed left-sided breast cancer.  HISTORY OF PRESENTING ILLNESS:   [name] is a very pleasant 80 year old lady who has been referred to [place] by her PCP Gildardo Cranker, DO for evaluation and management of newly diagnosed breast cancer.  [name] has significant medical comorbidities as noted below with a history of hypertension, dyslipidemia, diabetes on insulin , CKD stage III, acute myocardial infarction in 2004 and coronary artery disease status post multiple stents ( had high risk stress test findings in February 2016 on nuclear stress test suggestive of multivessel coronary artery disease], obstructive sleep apnea (noncompliant with CPAP use), pulmonary embolism in September 2013 and previously with DVT several years ago ( it was determined that she might need lifelong anticoagulation) significant osteo-arthritis that limits her mobility, gout for which she is on chronic colchicine and morbid obesity .Body mass index is 56.43 kg/(m^2)., among other medical problems.  She was noted   noted to have intraductal left breast masses associated with left breast calcification on a routine mammogram on 03/24/2014 .  Patient eventually had a ultrasound-guided needle biopsy of her left breast mass with calcifications that showed normal breast parenchyma and dilated ducts but was not able to sample the actual pathology.  Patient then had a radioactive seed localization off the breast pathology under mammographic guidance on 11/04/2014 and a subsequent left breast lumpectomy under local anesthesia due to her high surgical  risk on 11/05/2014 by Dr. Ninfa Linden. Sentinel lymph node biopsy was not possible.  Pathology showed 1.2 cm invasive ductal carcinoma which was strongly ER and PR positive and HER-2/neu negative with a Ki-67 of 5%, grade 2 out of 3. Invasive ductal carcinoma close to the anterior margin but margin negative. Ductal carcinoma in situ intermediate grade present at posterior and superior margins.  Patient was referred to me for evaluation regarding adjuvant treatments. Her current ECOG performance status is 3, with significant functional limitation and cardiac stress test evaluation suggesting high risk likely multivessel coronary artery disease. She has multiple other life-threatening and quality of life limiting medical comorbidities. She is accompanied by her daughter and grandchildren with another daughter joining in on a conference telephone call. She notes that her lumpectomy site has healed very well. Notes no enlarged lymph nodes under her armpits, neck or around her collarbone. She notes no acute new pains in her bones. No headaches. No significant acute weight loss. No new abdominal pain. She strongly favors a minimalistic and conservative approach to any treatments.   MEDICAL HISTORY:  Past Medical History  Diagnosis Date  . Hyperlipidemia     takes Pravastatin daily  . Coronary artery disease     a. s/p IMI 2004 tx with BMS to RCA;  b. s/p Promus DES to RCA 2/12 (cath: LM 20-30%, pLAD 20-30%, mLAD 50%, RI 30%, pRCA 60%, mRCA 90% - tx with PCI);  c. myoview 1/07: EF 49%, inf scar, no isch  . Urinary incontinence     takes Linzess daily  . Insomnia   . Osteoarthritis   . Obstructive sleep apnea   . Polymyalgia rheumatica   . Arthritis   . Pulmonary emboli 9/13    felt to  need lifelong anticoagulation  . Allergy     takes Mucinex daily as needed  . Gout, unspecified     takes Colchicine daily  . Dysuria   . Pain, chronic   . Osteoarthrosis, unspecified whether generalized or  localized, lower leg   . Anemia, unspecified   . Coronary atherosclerosis of native coronary artery   . CHF (congestive heart failure)     takes Furosemide daily  . Depression     takes Cymbalta daily  . Anxiety     takes Clonazepam daily as needed  . Hypertension     takes Imdur and Metoprolol daily  . GERD (gastroesophageal reflux disease)     takes Protonix daily  . Diabetes mellitus     insulin daily  . Type II or unspecified type diabetes mellitus without mention of complication, uncontrolled   . Myocardial infarction 2004  . History of blood clots about 42yr ago    in legs-takes Coumadin   . Shortness of breath dyspnea     with exertion and has Albuterol inhaler prn  . Headache     occasionally  . Vertigo     hx of;was taking Meclizine if needed  . Numbness   . Joint pain   . Joint swelling   . Osteoarthritis     SURGICAL HISTORY: Past Surgical History  Procedure Laterality Date  . Appendectomy    . Cardiac catheterization    . G[facility] bypass  1977     [facility] Blood clots/legs and lungs  2013  . Mi with stent placement  2004  . Left heart catheterization with coronary angiogram N/A 06/29/2014    Procedure: LEFT HEART CATHETERIZATION WITH CORONARY ANGIOGRAM;  Surgeon: TTroy Sine MD;  [facility];  Service: Cardiovascular;  Laterality: N/A;  . Coronary angioplasty  2  . Eye surgery Bilateral     cataract   . Colonoscopy    . Abdominal hysterectomy      partial  . Breast lumpectomy with radioactive seed localization Left 11/05/2014    Procedure: LEFT BREAST LUMPECTOMY WITH RADIOACTIVE SEED LOCALIZATION;  Surgeon: DCoralie Keens MD;  [place]  Service: General;  Laterality: Left;  . Excision of skin tag Right 11/05/2014    Procedure: EXCISION OF RIGHT EYELID SKIN TAG;  Surgeon: DCoralie Keens MD;  [place]  Service: General;  Laterality: Right;    SOCIAL HISTORY: Social History   Social History  .  Marital Status: Widowed    Spouse Name: N/A  . Number of Children: N/A  . Years of Education: N/A   Occupational History  . retired    Social History Main Topics  . Smoking status: Never Smoker   . Smokeless tobacco: Not on file  . Alcohol Use: No  . Drug Use: No  . Sexual Activity: Not Currently   Other Topics Concern  . Not on file   Social History Narrative    FAMILY HISTORY: Family History  Problem Relation Age of Onset  . Breast cancer Mother 644 . Heart disease Mother   . Throat cancer Father   . Hypertension Father   . Arthritis Father   . Diabetes Father   . Arthritis Sister   . Obesity Sister   . Diabetes Sister     ALLERGIES:  is allergic to sulfonamide derivatives and tramadol.  MEDICATIONS:  Current Outpatient Prescriptions  Medication Sig Dispense Refill  . acetaminophen (TYLENOL) 325 MG tablet  Take 2 tablets (650 mg total) by mouth every 6 (six) hours as needed for mild pain (or Fever >/= 101).    [name] albuterol (PROVENTIL HFA;VENTOLIN HFA) 108 (90 BASE) MCG/ACT inhaler Inhale 2 puffs into the lungs every 6 (six) hours as needed for wheezing or shortness of breath. 1 Inhaler 0  . budesonide-formoterol (SYMBICORT) 160-4.5 MCG/ACT inhaler In 2 puffs twice daily to help wheezing 1 Inhaler 3  . Calcium Carbonate Antacid (TUMS PO) Take 3 tablets by mouth daily as needed (acid reflux).    . clonazePAM (KLONOPIN) 1 MG tablet TAKE TWO TABLETS BY MOUTH AT BEDTIME FOR SLEEP 60 tablet 0  . diphenhydrAMINE (BENADRYL) 25 MG tablet Take 50 mg by mouth at bedtime. For itching    . DULoxetine (CYMBALTA) 60 MG capsule Take 1 capsule by mouth every day for anxiety 90 capsule 1  . furosemide (LASIX) 20 MG tablet Take one tablet by mouth twice daily for edema 180 tablet 1  . guaiFENesin-dextromethorphan (ROBITUSSIN DM) 100-10 MG/5ML syrup Take 5 mLs by mouth every 4 (four) hours as needed for cough. 118 mL 0  . Insulin Glargine (TOUJEO SOLOSTAR) 300 UNIT/ML SOPN Inject 45  Units into the skin 2 (two) times daily before a meal. Titrate up as needed. 10 pen 11  . Insulin Lispro, Human, (HUMALOG KWIKPEN) 200 UNIT/ML SOPN Inject 35 Units into the skin 3 (three) times daily before meals. (Patient taking differently: Inject 35 Units into the skin See admin instructions. Inject 35 units at each meal (most days twice daily, occasionally 3 times daily)) 12 mL 6  . isosorbide mononitrate (IMDUR) 60 MG 24 hr tablet TAKE ONE TABLET BY MOUTH ONCE DAILY 90 tablet 1  . Lancets (ONETOUCH ULTRASOFT) lancets Use to test blood sugar Dx: E11.9 100 each 12  . Linaclotide (LINZESS) 145 MCG CAPS capsule Take 1 capsule (145 mcg total) by mouth daily. 30 capsule 3  . meclizine (ANTIVERT) 25 MG tablet Take 1 tablet (25 mg total) by mouth 3 (three) times daily. 90 tablet 0  . metoprolol succinate (TOPROL-XL) 25 MG 24 hr tablet Take 1 tablet by mouth once every day for blood pressure 90 tablet 1  . nitroGLYCERIN (NITROSTAT) 0.4 MG SL tablet Take one tablet under the tongue every 5 minutes as needed for chest pain (Patient taking differently: Place 0.4 mg under the tongue every 5 (five) minutes as needed for chest pain. ) 50 tablet 0  . oxyCODONE-acetaminophen (PERCOCET) 10-325 MG per tablet Take one tablet every 6 hours as needed for pain 120 tablet 0  . pantoprazole (PROTONIX) 40 MG tablet TAKE ONE TABLET BY MOUTH ONCE DAILY 90 tablet 1  . pravastatin (PRAVACHOL) 20 MG tablet TAKE ONE TABLET BY MOUTH ONCE DAILY FOR  CHOLESTEROL 30 tablet 5  . temazepam (RESTORIL) 30 MG capsule Take 1 capsule (30 mg total) by mouth at bedtime as needed for sleep. 30 capsule 2  . warfarin (COUMADIN) 5 MG tablet Take 1 & 1/2 tablets daily except 1 tablet on Mondays and Fridays 45 tablet 4  . allopurinol (ZYLOPRIM) 300 MG tablet Take 300 mg by mouth.    [name] anastrozole (ARIMIDEX) 1 MG tablet Take 1 tablet (1 mg total) by mouth daily. 30 tablet 11  . ergocalciferol (VITAMIN D2) 50000 UNITS capsule Take 1 capsule (50,000  Units total) by mouth once a week. 8 capsule 0   No current facility-administered medications for this visit.    REVIEW OF SYSTEMS:   Review of systems as  noted above Remaining 10 point review of systems negative except as noted above.  PHYSICAL EXAMINATION: ECOG PERFORMANCE STATUS: 3 - Symptomatic, >50% confined to bed  Filed Vitals:   12/16/14 1103  BP: 155/79  Pulse: 102  Temp: 97.7 F (36.5 C)  Resp: 18   Filed Weights   12/16/14 1103  Weight: 360 lb 6.4 oz (163.476 kg)   GENERAL: Pleasant somewhat tired appearing elderly African-American lady, alert, no distress and comfortable, obese. SKIN: skin color, texture, turgor are normal, no rashes or significant lesions EYES: normal, conjunctiva are pink and non-injected, sclera clear OROPHARYNX: no exudate, no erythema and lips, buccal mucosa, and tongue normal  NECK: supple, thyroid normal size, non-tender, without nodularity LYMPH:  no palpable lymphadenopathy in the cervical, axillary or inguinal LUNGS: clear to auscultation normal breathing effort HEART: regular rate & rhythm and no murmurs and no lower extremity edema ABDOMEN:abdomen obese, soft, non-tender and normal bowel sounds Musculoskeletal:no cyanosis of digits and no clubbing  PSYCH: alert & oriented x 3 with fluent speech NEURO: no focal motor/sensory deficits  LABORATORY DATA:  I have reviewed the data as listed Lab Results  Component Value Date   WBC 7.1 12/16/2014   HGB 10.4* 12/16/2014   HCT 32.9* 12/16/2014   MCV 83.9 12/16/2014   PLT 251 12/16/2014    Recent Labs  04/22/14 0323  09/24/14 1459 09/25/14 0045 10/29/14 1359 12/03/14 1149 12/16/14 1317  NA 130*  < > 136 135 136 140 135*  K 5.3  < > 5.3* 5.3* 4.7 4.3 4.5  CL 93*  < > 101 99* 101 97  --   CO2 25  < > _0 GLUCOSE 299*  < > 325* 283* 313* 173* 294*  BUN 22  < > 20 22* 16 26 21.4  CREATININE 1.54*  < > 1.28* 1.32* 1.34* 1.43* 1.4*  CALCIUM 8.4  < > 8.9 8.8* 8.7* 8.4*  8.6  GFRNONAA 31*  < > 39* 38* 37* 35*  --   GFRAA 36*  < > 46* 44* 43* 40*  --   PROT 7.3  < > 8.4*  --   --  7.3 8.0  ALBUMIN 2.7*  --  2.9*  --   --   --  3.2*  AST 18  < > 22  --   --  21 21  ALT 21  < > 21  --   --  20 24  ALKPHOS 105  < > 84  --   --  88 99  BILITOT 0.4  < > 0.4  --   --  <0.2 0.40  < > = values in this interval not displayed.  RADIOGRAPHIC STUDIES: I have personally reviewed the radiological images as listed and agreed with the findings in the report.  Mammogram digital diagnostic bilateral 03/24/2014: IMPRESSION: Suspicious intraductal left breast masses, with associated left breast calcifications.  RECOMMENDATION: Ultrasound-guided biopsy of the intraductal masses in the left breast is recommended. This is scheduled for 04/07/2014 at 3 p.m.  I have discussed the findings and recommendations with the patient. Results were also provided in writing at the conclusion of the visit. If applicable, a reminder letter will be sent to the patient regarding the next appointment.  BI-RADS CATEGORY 4: Suspicious.         ASSESSMENT & PLAN:   [name] is a very wonderful 80 year old African-American female with multiple medical comorbidities as described above with   #1 Newly diagnosed  left-sided presumed stage IA  (pT1c,pNx[cN0], cM0) invasive ductal carcinoma grade 2 out of 3, strongly ER +100%, strongly PR +100% and HER-2/neu negative. Given her high risk cardiac status lumpectomy had to be done under local anesthesia and sentinel lymph node biopsy was not possible. She has accompanying DCIS of intermediate grade present at margins. ECOG performance status is 3. #2 significant coronary artery disease with stress test in February 2016 suggesting likely multivessel disease. Has uncontrolled diabetes, hypertension, dyslipidemia, untreated sleep apnea, coronary disease, severe arthritis all of which are significantly limiting her quality of life. #3 vitamin  D deficiency - 25OH vitamin D level of 10 Plan  -I discussed with the patient her diagnosis, prognosis and treatment options in detail and spent significant time with her and her daughter getting to know her preferences with regards to treatment. -I explained that in a otherwise healthy person one could consider getting an Oncotype DX testing to determine recurrence risk and used that data to determine the role of adjuvant chemotherapy in addition to hormonal therapy. -She is absolutely opposed to the idea of chemotherapy which is not unreasonable given her overall performance status and medical comorbidities. Therefore at this time we shall not send out Oncotype DX testing. -Given her postmenopausal status we would recommend endocrine adjuvant therapy with an aromatase inhibitor to reduce the risk of ipsilateral and contralateral breast recurrence as well as systemic recurrence of breast cancer. -DEXA scan was ordered for bone density testing -which is currently pending. -She was started on high-dose vitamin D to treat her vitamin D deficiency: Ergocalciferol 50,000 units weekly 8 doses followed by vitamin D 2000 international units daily along with calcium 1000 mg daily. -We got baseline labs today. -She was given a prescription to start Arimidex 1 mg by mouth daily after undergoing medication counseling and being given medication patient information printouts. -We discussed the potential role of breast radiation to reduce the risk of local recurrence in her left breast especially given her concurrent DCIS. We discussed that in patients above 7 yrs years of age with early breast cancer there might be a small improvement in local regional recurrence with additional radiation but the CALGB 9343 study did not show any survival benefits at 10 years (ref below). -She was offered a referral to radiation oncology but clearly refused. -She was recommended repeating her INR checked about 5-7 days after  starting Arimidex to rule out significant interaction with her Coumadin and to adjust doses accordingly.  #4 postmenopausal vaginal bleeding -patient notes that she is following up with GYN for this and was told that she has an endometrial polyp. That is ongoing discussion about whether this needs to be removed under local anesthesia.   Return to care with Dr. Irene Limbo in 4 weeks with CBC, CMP to assess Arimidex tolerance. Continue follow-up with your primary care physician Dr. Eulas Post for other ongoing cares.  I appreciate the privilege of taking care of this wonderful person.  All questions were answered. The patient knows to call the clinic with any problems, questions or concerns. I spent 60 minutes counseling the patient face to face. The total time spent in the appointment was 80 minutes and more than 50% was on counseling.  Sullivan Lone MD MS Hematology/Oncology Physician [facility]  (Office):       [phone] (Work cell):  [phone] (Fax):           [phone]   Ref 1.  CALGB [phone] study evaluated if there was benefit  of adjuvant radiation in women with early-stage breast cancer who were over age > 62 years . Patients in this study had T1 breast cancers (tumor <2 cm) that were ER+ and lymph node negative. All had breast-conserving surgery. Over 636 patients over age 79 with Stage I ER+ breast cancer were randomized to tamoxifen + radiation or to tamoxifen alone. While there was a small improvement in locoregional recurrence with the addition of radiation therapy, the 10-year OS was 67% in the radiation/tamoxifen arm vs. 66% in the tamoxifen only arm. Sherald Hess et al. Lumpectomy plus tamoxifen with or without irradiation in women age 29 years or older with early breast cancer: long-term follow-up of CALGB 9343. J Clin Oncol 2013;31(19):2382-7

## 2014-12-16 NOTE — Progress Notes (Signed)
Checked in new breast patient. Gave pt my card if any financial questions or concerns.

## 2014-12-17 ENCOUNTER — Other Ambulatory Visit: Payer: Self-pay | Admitting: Hematology

## 2014-12-17 DIAGNOSIS — E559 Vitamin D deficiency, unspecified: Secondary | ICD-10-CM

## 2014-12-17 LAB — VITAMIN D 25 HYDROXY (VIT D DEFICIENCY, FRACTURES): Vit D, 25-Hydroxy: 10 ng/mL — ABNORMAL LOW (ref 30–100)

## 2014-12-17 MED ORDER — ERGOCALCIFEROL 1.25 MG (50000 UT) PO CAPS: 50000.0000 [IU] | ORAL_CAPSULE | ORAL | Status: DC

## 2014-12-17 NOTE — Telephone Encounter (Signed)
Called patient and informed her of her vitamin D deficiency and informed her that a prescription for ergocalciferol has been sent to her preferred pharmacy for her to start taking.  Continue other plan as previously.  Sullivan Lone MD [place] Hematology/Oncology Physician [facility]  (Office):       [phone] (Work cell):  [phone] (Fax):           [phone]

## 2014-12-18 DIAGNOSIS — N183 Chronic kidney disease, stage 3 unspecified: Secondary | ICD-10-CM | POA: Insufficient documentation

## 2014-12-18 DIAGNOSIS — N179 Acute kidney failure, unspecified: Secondary | ICD-10-CM | POA: Insufficient documentation

## 2014-12-18 DIAGNOSIS — N1831 Chronic kidney disease, stage 3a: Secondary | ICD-10-CM | POA: Insufficient documentation

## 2014-12-18 DIAGNOSIS — Z6841 Body Mass Index (BMI) 40.0 and over, adult: Secondary | ICD-10-CM

## 2014-12-18 HISTORY — DX: Chronic kidney disease, unspecified: N17.9

## 2014-12-23 ENCOUNTER — Telehealth: Payer: Self-pay

## 2014-12-23 NOTE — Telephone Encounter (Signed)
Patient needs PA for light weight wheelchair. [phone], ID [id number]  When placing call for PA make sure you have diagnosises available, NPI number, Tax ID number  Problem list was printed off and given to Dr.Carter to circle all related diagnosis that would be associated with the need for light weight wheel chair

## 2014-12-28 ENCOUNTER — Other Ambulatory Visit: Payer: Self-pay | Admitting: *Deleted

## 2014-12-28 DIAGNOSIS — E785 Hyperlipidemia, unspecified: Principal | ICD-10-CM

## 2014-12-28 DIAGNOSIS — E1169 Type 2 diabetes mellitus with other specified complication: Secondary | ICD-10-CM

## 2014-12-28 MED ORDER — PRAVASTATIN SODIUM 20 MG PO TABS: ORAL_TABLET | ORAL | Status: DC

## 2014-12-28 MED ORDER — CLONAZEPAM 1 MG PO TABS: ORAL_TABLET | ORAL | Status: DC

## 2014-12-28 NOTE — Telephone Encounter (Signed)
Gasper Sells called back and corrected the number [phone].  I called to initiate PA and was told the DME provider/company needs to be the one to call. I called patient's daughter and was told I need to follow-up with Stacia. I called Stacia back at [employer] and she indicated patient told her that she seen a wheelchair in a magazine. Gasper Sells was unaware of the company name. I was told to futher follow-up with patient's family   I called patient's daughter and got the number of the [place] [phone]  I called [facility] and spoke with [name], they do not accept insurance. Per [name] no need to initiate PA for insurance is not accepted, this company is private pay.     I called patient's daughter and left message informing her that patient will have to pay out of pocket if she would like to use this [employer]. If patient would like to reconsider using [facility] patient to call and inform us. Otherwise patient will need to check with other local medical supply companies.

## 2014-12-28 NOTE — Telephone Encounter (Signed)
Patient requested to be sent to pharmacy.  

## 2014-12-28 NOTE — Telephone Encounter (Signed)
I have called the number for PA and the number sends me round and round and I am not able to speak with a human. The number seems to be invalid. I called Stacia and left message to confirm number that she gave me. Awaiting return call

## 2015-01-03 ENCOUNTER — Other Ambulatory Visit: Payer: Self-pay | Admitting: Internal Medicine

## 2015-01-04 ENCOUNTER — Telehealth: Payer: Self-pay | Admitting: Hematology

## 2015-01-04 NOTE — Telephone Encounter (Signed)
pt called to r/s appt...done....pt ok and aware °

## 2015-01-05 ENCOUNTER — Other Ambulatory Visit: Payer: Self-pay | Admitting: Internal Medicine

## 2015-01-06 ENCOUNTER — Other Ambulatory Visit: Payer: Self-pay

## 2015-01-06 DIAGNOSIS — G4700 Insomnia, unspecified: Secondary | ICD-10-CM

## 2015-01-06 MED ORDER — TEMAZEPAM 30 MG PO CAPS: 30.0000 mg | ORAL_CAPSULE | Freq: Every evening | ORAL | Status: DC | PRN

## 2015-01-13 ENCOUNTER — Other Ambulatory Visit: Payer: Medicare PPO

## 2015-01-13 ENCOUNTER — Ambulatory Visit: Payer: Medicare PPO | Admitting: Hematology

## 2015-01-18 ENCOUNTER — Ambulatory Visit (INDEPENDENT_AMBULATORY_CARE_PROVIDER_SITE_OTHER): Payer: Medicare PPO | Admitting: Pharmacotherapy

## 2015-01-18 ENCOUNTER — Encounter: Payer: Self-pay | Admitting: Pharmacotherapy

## 2015-01-18 VITALS — BP 122/88 | HR 90 | Temp 97.5°F | Resp 20 | Ht 67.0 in | Wt 347.6 lb

## 2015-01-18 DIAGNOSIS — Z7901 Long term (current) use of anticoagulants: Secondary | ICD-10-CM | POA: Diagnosis not present

## 2015-01-18 DIAGNOSIS — E1169 Type 2 diabetes mellitus with other specified complication: Secondary | ICD-10-CM

## 2015-01-18 DIAGNOSIS — E785 Hyperlipidemia, unspecified: Secondary | ICD-10-CM

## 2015-01-18 DIAGNOSIS — I2782 Chronic pulmonary embolism: Secondary | ICD-10-CM

## 2015-01-18 DIAGNOSIS — G4700 Insomnia, unspecified: Secondary | ICD-10-CM | POA: Diagnosis not present

## 2015-01-18 DIAGNOSIS — Z23 Encounter for immunization: Secondary | ICD-10-CM | POA: Diagnosis not present

## 2015-01-18 LAB — POCT INR: INR: 3.8

## 2015-01-18 MED ORDER — WARFARIN SODIUM 5 MG PO TABS: ORAL_TABLET | ORAL | Status: DC

## 2015-01-18 MED ORDER — PRAVASTATIN SODIUM 20 MG PO TABS: ORAL_TABLET | ORAL | Status: DC

## 2015-01-18 MED ORDER — TEMAZEPAM 30 MG PO CAPS: 30.0000 mg | ORAL_CAPSULE | Freq: Every evening | ORAL | Status: DC | PRN

## 2015-01-18 NOTE — Progress Notes (Signed)
   Subjective:    Patient ID: [name], female    DOB: [birth year], 80 y.o.   MRN: [id number]  HPI Last INR was 3.1 on Coumadin 7.5mg  QD except 5mg  M/F Denies missed doses, but doesn't always take the 5mg  on the same day each week. Denies unusual bleeding or bruising Denies CP, falls Consistent with vitamin K intake   Review of Systems  HENT: Negative for nosebleeds.   Respiratory: Negative for shortness of breath.   Cardiovascular: Negative for chest pain.  Gastrointestinal: Negative for anal bleeding.  Genitourinary: Negative for hematuria.  Hematological: Does not bruise/bleed easily.       Objective:   Physical Exam  Constitutional: She is oriented to person, place, and time. She appears well-developed and well-nourished.  HENT:  Right Ear: External ear normal.  Left Ear: External ear normal.  Cardiovascular: Normal rate, regular rhythm and normal heart sounds.   Pulmonary/Chest: Effort normal and breath sounds normal.  Neurological: She is alert and oriented to person, place, and time.  Skin: Skin is warm and dry.  Psychiatric: She has a normal mood and affect. Her behavior is normal. Judgment and thought content normal.  Vitals reviewed.   BP:  122/88, HR:  90  Wt:  347lb INR 3.8      Assessment & Plan:  1. INR above target 2-3 2. Decrease Coumadin 7.5mg  QD except 5mg  MWF 3.  RTC 1 month 4. Counseled on importance of taking warfarin exactly as prescribed.

## 2015-01-18 NOTE — Patient Instructions (Signed)
INR 3.8 Decrease Coumadin 7.5mg  (1 & 1/2 tablets) daily except 5mg  (1 tablet) on Mondays, Wednesdays, and Fridays

## 2015-01-19 ENCOUNTER — Other Ambulatory Visit: Payer: Self-pay | Admitting: *Deleted

## 2015-01-19 DIAGNOSIS — G894 Chronic pain syndrome: Secondary | ICD-10-CM

## 2015-01-19 MED ORDER — OXYCODONE-ACETAMINOPHEN 10-325 MG PO TABS: ORAL_TABLET | ORAL | Status: DC

## 2015-01-19 NOTE — Telephone Encounter (Signed)
Patient requested and will pick up 

## 2015-01-20 ENCOUNTER — Other Ambulatory Visit: Payer: Self-pay

## 2015-01-20 ENCOUNTER — Encounter: Payer: Self-pay | Admitting: Hematology

## 2015-01-20 ENCOUNTER — Other Ambulatory Visit (HOSPITAL_BASED_OUTPATIENT_CLINIC_OR_DEPARTMENT_OTHER): Payer: Medicare PPO

## 2015-01-20 ENCOUNTER — Encounter (HOSPITAL_COMMUNITY): Payer: Self-pay | Admitting: Emergency Medicine

## 2015-01-20 ENCOUNTER — Emergency Department (HOSPITAL_COMMUNITY): Payer: Medicare PPO

## 2015-01-20 ENCOUNTER — Ambulatory Visit (HOSPITAL_BASED_OUTPATIENT_CLINIC_OR_DEPARTMENT_OTHER): Payer: Medicare PPO | Admitting: Hematology

## 2015-01-20 ENCOUNTER — Emergency Department (HOSPITAL_COMMUNITY)
Admission: EM | Admit: 2015-01-20 | Discharge: 2015-01-20 | Disposition: A | Discharge status: Home / Self Care | Payer: Medicare PPO | Attending: Emergency Medicine | Admitting: Emergency Medicine

## 2015-01-20 VITALS — BP 173/71 | HR 76 | Resp 18 | Ht 67.0 in | Wt 355.9 lb

## 2015-01-20 DIAGNOSIS — M109 Gout, unspecified: Secondary | ICD-10-CM | POA: Diagnosis not present

## 2015-01-20 DIAGNOSIS — I509 Heart failure, unspecified: Secondary | ICD-10-CM | POA: Insufficient documentation

## 2015-01-20 DIAGNOSIS — Z9861 Coronary angioplasty status: Secondary | ICD-10-CM | POA: Diagnosis not present

## 2015-01-20 DIAGNOSIS — G8929 Other chronic pain: Secondary | ICD-10-CM | POA: Insufficient documentation

## 2015-01-20 DIAGNOSIS — Z79899 Other long term (current) drug therapy: Secondary | ICD-10-CM | POA: Insufficient documentation

## 2015-01-20 DIAGNOSIS — K219 Gastro-esophageal reflux disease without esophagitis: Secondary | ICD-10-CM | POA: Diagnosis not present

## 2015-01-20 DIAGNOSIS — I2089 Other forms of angina pectoris: Secondary | ICD-10-CM

## 2015-01-20 DIAGNOSIS — E785 Hyperlipidemia, unspecified: Secondary | ICD-10-CM | POA: Insufficient documentation

## 2015-01-20 DIAGNOSIS — E559 Vitamin D deficiency, unspecified: Secondary | ICD-10-CM

## 2015-01-20 DIAGNOSIS — Z86711 Personal history of pulmonary embolism: Secondary | ICD-10-CM | POA: Insufficient documentation

## 2015-01-20 DIAGNOSIS — C50919 Malignant neoplasm of unspecified site of unspecified female breast: Secondary | ICD-10-CM

## 2015-01-20 DIAGNOSIS — I208 Other forms of angina pectoris: Secondary | ICD-10-CM

## 2015-01-20 DIAGNOSIS — C50912 Malignant neoplasm of unspecified site of left female breast: Secondary | ICD-10-CM

## 2015-01-20 DIAGNOSIS — E119 Type 2 diabetes mellitus without complications: Secondary | ICD-10-CM | POA: Diagnosis not present

## 2015-01-20 DIAGNOSIS — Z7901 Long term (current) use of anticoagulants: Secondary | ICD-10-CM | POA: Insufficient documentation

## 2015-01-20 DIAGNOSIS — I5023 Acute on chronic systolic (congestive) heart failure: Secondary | ICD-10-CM | POA: Diagnosis not present

## 2015-01-20 DIAGNOSIS — E1100 Type 2 diabetes mellitus with hyperosmolarity without nonketotic hyperglycemic-hyperosmolar coma (NKHHC): Secondary | ICD-10-CM | POA: Diagnosis not present

## 2015-01-20 DIAGNOSIS — I10 Essential (primary) hypertension: Secondary | ICD-10-CM | POA: Diagnosis not present

## 2015-01-20 DIAGNOSIS — I252 Old myocardial infarction: Secondary | ICD-10-CM | POA: Insufficient documentation

## 2015-01-20 DIAGNOSIS — Z862 Personal history of diseases of the blood and blood-forming organs and certain disorders involving the immune mechanism: Secondary | ICD-10-CM | POA: Diagnosis not present

## 2015-01-20 DIAGNOSIS — F419 Anxiety disorder, unspecified: Secondary | ICD-10-CM | POA: Insufficient documentation

## 2015-01-20 DIAGNOSIS — M1990 Unspecified osteoarthritis, unspecified site: Secondary | ICD-10-CM | POA: Insufficient documentation

## 2015-01-20 DIAGNOSIS — I2510 Atherosclerotic heart disease of native coronary artery without angina pectoris: Secondary | ICD-10-CM | POA: Diagnosis not present

## 2015-01-20 DIAGNOSIS — Z794 Long term (current) use of insulin: Secondary | ICD-10-CM | POA: Diagnosis not present

## 2015-01-20 DIAGNOSIS — Z9889 Other specified postprocedural states: Secondary | ICD-10-CM | POA: Diagnosis not present

## 2015-01-20 DIAGNOSIS — E1165 Type 2 diabetes mellitus with hyperglycemia: Secondary | ICD-10-CM

## 2015-01-20 DIAGNOSIS — F329 Major depressive disorder, single episode, unspecified: Secondary | ICD-10-CM | POA: Insufficient documentation

## 2015-01-20 LAB — URINALYSIS, ROUTINE W REFLEX MICROSCOPIC
Bilirubin Urine: NEGATIVE
Glucose, UA: >1000 mg/dL — AB
Hgb urine dipstick: NEGATIVE
Ketones, ur: NEGATIVE mg/dL
Nitrite: NEGATIVE
Protein, ur: NEGATIVE mg/dL
Specific Gravity, Urine: 1.028 (ref 1.005–1.030)
Urobilinogen, UA: 0.2 mg/dL (ref 0.0–1.0)
pH: 6 (ref 5.0–8.0)

## 2015-01-20 LAB — COMPREHENSIVE METABOLIC PANEL (CC13)
ALT: 29 U/L (ref 0–55)
AST: 27 U/L (ref 5–34)
Albumin: 3.1 g/dL — ABNORMAL LOW (ref 3.5–5.0)
Alkaline Phosphatase: 95 U/L (ref 40–150)
Anion Gap: 9 mEq/L (ref 3–11)
BUN: 23.5 mg/dL (ref 7.0–26.0)
CO2: 27 mEq/L (ref 22–29)
Calcium: 9.1 mg/dL (ref 8.4–10.4)
Chloride: 97 mEq/L — ABNORMAL LOW (ref 98–109)
Creatinine: 1.6 mg/dL — ABNORMAL HIGH (ref 0.6–1.1)
EGFR: 35 mL/min/{1.73_m2} — ABNORMAL LOW (ref >90)
Glucose: 529 mg/dl — ABNORMAL HIGH (ref 70–140)
Potassium: 5 mEq/L (ref 3.5–5.1)
Sodium: 133 mEq/L — ABNORMAL LOW (ref 136–145)
Total Bilirubin: 0.53 mg/dL (ref 0.20–1.20)
Total Protein: 7.8 g/dL (ref 6.4–8.3)

## 2015-01-20 LAB — HEPATIC FUNCTION PANEL
ALT: 34 U/L (ref 14–54)
AST: 38 U/L (ref 15–41)
Albumin: 3.4 g/dL — ABNORMAL LOW (ref 3.5–5.0)
Alkaline Phosphatase: 82 U/L (ref 38–126)
Bilirubin, Direct: <0.1 mg/dL — ABNORMAL LOW (ref 0.1–0.5)
Total Bilirubin: 0.7 mg/dL (ref 0.3–1.2)
Total Protein: 8 g/dL (ref 6.5–8.1)

## 2015-01-20 LAB — BASIC METABOLIC PANEL
Anion gap: 8 (ref 5–15)
BUN: 24 mg/dL — ABNORMAL HIGH (ref 6–20)
CO2: 28 mmol/L (ref 22–32)
Calcium: 8.9 mg/dL (ref 8.9–10.3)
Chloride: 95 mmol/L — ABNORMAL LOW (ref 101–111)
Creatinine, Ser: 1.36 mg/dL — ABNORMAL HIGH (ref 0.44–1.00)
GFR calc Af Amer: 42 mL/min — ABNORMAL LOW (ref >60)
GFR calc non Af Amer: 36 mL/min — ABNORMAL LOW (ref >60)
Glucose, Bld: 490 mg/dL — ABNORMAL HIGH (ref 65–99)
Potassium: 5 mmol/L (ref 3.5–5.1)
Sodium: 131 mmol/L — ABNORMAL LOW (ref 135–145)

## 2015-01-20 LAB — CBC WITH DIFFERENTIAL/PLATELET
BASO%: 0.5 % (ref 0.0–2.0)
Basophils Absolute: 0 10*3/uL (ref 0.0–0.1)
EOS%: 2.9 % (ref 0.0–7.0)
Eosinophils Absolute: 0.2 10*3/uL (ref 0.0–0.5)
HCT: 32.7 % — ABNORMAL LOW (ref 34.8–46.6)
HGB: 10.2 g/dL — ABNORMAL LOW (ref 11.6–15.9)
LYMPH%: 30.1 % (ref 14.0–49.7)
MCH: 25.9 pg (ref 25.1–34.0)
MCHC: 31.1 g/dL — ABNORMAL LOW (ref 31.5–36.0)
MCV: 83.4 fL (ref 79.5–101.0)
MONO#: 0.6 10*3/uL (ref 0.1–0.9)
MONO%: 8.9 % (ref 0.0–14.0)
NEUT#: 3.6 10*3/uL (ref 1.5–6.5)
NEUT%: 57.6 % (ref 38.4–76.8)
Platelets: 282 10*3/uL (ref 145–400)
RBC: 3.92 10*6/uL (ref 3.70–5.45)
RDW: 16.4 % — ABNORMAL HIGH (ref 11.2–14.5)
WBC: 6.3 10*3/uL (ref 3.9–10.3)
lymph#: 1.9 10*3/uL (ref 0.9–3.3)

## 2015-01-20 LAB — CBG MONITORING, ED
Glucose-Capillary: 483 mg/dL — ABNORMAL HIGH (ref 65–99)
Glucose-Capillary: 451 mg/dL — ABNORMAL HIGH (ref 65–99)
Glucose-Capillary: 336 mg/dL — ABNORMAL HIGH (ref 65–99)

## 2015-01-20 LAB — URINE MICROSCOPIC-ADD ON

## 2015-01-20 LAB — CBC
HCT: 34.1 % — ABNORMAL LOW (ref 36.0–46.0)
Hemoglobin: 10.6 g/dL — ABNORMAL LOW (ref 12.0–15.0)
MCH: 26.2 pg (ref 26.0–34.0)
MCHC: 31.1 g/dL (ref 30.0–36.0)
MCV: 84.2 fL (ref 78.0–100.0)
Platelets: 292 10*3/uL (ref 150–400)
RBC: 4.05 MIL/uL (ref 3.87–5.11)
RDW: 15.2 % (ref 11.5–15.5)
WBC: 9 10*3/uL (ref 4.0–10.5)

## 2015-01-20 LAB — I-STAT TROPONIN, ED: Troponin i, poc: 0 ng/mL (ref 0.00–0.08)

## 2015-01-20 LAB — BRAIN NATRIURETIC PEPTIDE: B Natriuretic Peptide: 23.4 pg/mL (ref 0.0–100.0)

## 2015-01-20 LAB — I-STAT CG4 LACTIC ACID, ED: Lactic Acid, Venous: 2.06 mmol/L (ref 0.5–2.0)

## 2015-01-20 LAB — LIPASE, BLOOD: Lipase: 22 U/L (ref 22–51)

## 2015-01-20 MED ORDER — SODIUM CHLORIDE 0.9 % IV BOLUS (SEPSIS)
1000.0000 mL | Freq: Once | INTRAVENOUS | Status: AC
  Administered 2015-01-20: 1000 mL via INTRAVENOUS

## 2015-01-20 MED ORDER — ANASTROZOLE 1 MG PO TABS: 1.0000 mg | ORAL_TABLET | Freq: Every day | ORAL | Status: DC

## 2015-01-20 MED ORDER — IOHEXOL 350 MG/ML SOLN
100.0000 mL | Freq: Once | INTRAVENOUS | Status: AC | PRN
Start: 2015-01-20 — End: 2015-01-20
  Administered 2015-01-20: 100 mL via INTRAVENOUS

## 2015-01-20 MED ORDER — ALBUTEROL SULFATE HFA 108 (90 BASE) MCG/ACT IN AERS: 1.0000 | INHALATION_SPRAY | Freq: Four times a day (QID) | RESPIRATORY_TRACT | Status: DC | PRN

## 2015-01-20 MED ORDER — INSULIN ASPART 100 UNIT/ML ~~LOC~~ SOLN
10.0000 [IU] | Freq: Once | SUBCUTANEOUS | Status: AC
  Administered 2015-01-20: 10 [IU] via INTRAVENOUS
  Filled 2015-01-20: qty 1

## 2015-01-20 MED ORDER — MORPHINE SULFATE (PF) 4 MG/ML IV SOLN
4.0000 mg | Freq: Once | INTRAVENOUS | Status: AC
  Administered 2015-01-20: 4 mg via INTRAVENOUS
  Filled 2015-01-20: qty 1

## 2015-01-20 NOTE — Discharge Instructions (Signed)
Blood Glucose Monitoring Monitoring your blood glucose (also know as blood sugar) helps you to manage your diabetes. It also helps you and your health care provider monitor your diabetes and determine how well your treatment plan is working. WHY SHOULD YOU MONITOR YOUR BLOOD GLUCOSE?  It can help you understand how food, exercise, and medicine affect your blood glucose.  It allows you to know what your blood glucose is at any given moment. You can quickly tell if you are having low blood glucose (hypoglycemia) or high blood glucose (hyperglycemia).  It can help you and your health care provider know how to adjust your medicines.  It can help you understand how to manage an illness or adjust medicine for exercise. WHEN SHOULD YOU TEST? Your health care provider will help you decide how often you should check your blood glucose. This may depend on the type of diabetes you have, your diabetes control, or the types of medicines you are taking. Be sure to write down all of your blood glucose readings so that this information can be reviewed with your health care provider. See below for examples of testing times that your health care provider may suggest. Type 1 Diabetes  Test 4 times a day if you are in good control, using an insulin pump, or perform multiple daily injections.  If your diabetes is not well controlled or if you are sick, you may need to monitor more often.  It is a good idea to also monitor:  Before and after exercise.  Between meals and 2 hours after a meal.  Occasionally between 2:00 a.m. and 3:00 a.m. Type 2 Diabetes  It can vary with each person, but generally, if you are on insulin, test 4 times a day.  If you take medicines by mouth (orally), test 2 times a day.  If you are on a controlled diet, test once a day.  If your diabetes is not well controlled or if you are sick, you may need to monitor more often. HOW TO MONITOR YOUR BLOOD GLUCOSE Supplies  Needed  Blood glucose meter.  Test strips for your meter. Each meter has its own strips. You must use the strips that go with your own meter.  A pricking needle (lancet).  A device that holds the lancet (lancing device).  A journal or log book to write down your results. Procedure  Wash your hands with soap and water. Alcohol is not preferred.  Prick the side of your finger (not the tip) with the lancet.  Gently milk the finger until a small drop of blood appears.  Follow the instructions that come with your meter for inserting the test strip, applying blood to the strip, and using your blood glucose meter. Other Areas to Get Blood for Testing Some meters allow you to use other areas of your body (other than your finger) to test your blood. These areas are called alternative sites. The most common alternative sites are:  The forearm.  The thigh.  The back area of the lower leg.  The palm of the hand. The blood flow in these areas is slower. Therefore, the blood glucose values you get may be delayed, and the numbers are different from what you would get from your fingers. Do not use alternative sites if you think you are having hypoglycemia. Your reading will not be accurate. Always use a finger if you are having hypoglycemia. Also, if you cannot feel your lows (hypoglycemia unawareness), always use your fingers for your  blood glucose checks. ADDITIONAL TIPS FOR GLUCOSE MONITORING  Do not reuse lancets.  Always carry your supplies with you.  All blood glucose meters have a 24-hour "hotline" number to call if you have questions or need help.  Adjust (calibrate) your blood glucose meter with a control solution after finishing a few boxes of strips. BLOOD GLUCOSE RECORD KEEPING It is a good idea to keep a daily record or log of your blood glucose readings. Most glucose meters, if not all, keep your glucose records stored in the meter. Some meters come with the ability to download  your records to your home computer. Keeping a record of your blood glucose readings is especially helpful if you are wanting to look for patterns. Make notes to go along with the blood glucose readings because you might forget what happened at that exact time. Keeping good records helps you and your health care provider to work together to achieve good diabetes management.  Document Released: 04/27/2003 Document Revised: 09/08/2013 Document Reviewed: 09/16/2012 [facility] Patient Information 2015 [place], [place]. This information is not intended to replace advice given to you by your health care provider. Make sure you discuss any questions you have with your health care provider.  Diabetes and Standards of Medical Care Diabetes is complicated. You may find that your diabetes team includes a dietitian, nurse, diabetes educator, eye doctor, and more. To help everyone know what is going on and to help you get the care you deserve, the following schedule of care was developed to help keep you on track. Below are the tests, exams, vaccines, medicines, education, and plans you will need. HbA1c test This test shows how well you have controlled your glucose over the past 2-3 months. It is used to see if your diabetes management plan needs to be adjusted.   It is performed at least 2 times a year if you are meeting treatment goals.  It is performed 4 times a year if therapy has changed or if you are not meeting treatment goals. Blood pressure test  This test is performed at every routine medical visit. The goal is less than 140/90 mm Hg for most people, but 130/80 mm Hg in some cases. Ask your health care provider about your goal. Dental exam  Follow up with the dentist regularly. Eye exam  If you are diagnosed with type 1 diabetes as a child, get an exam upon reaching the age of 28 years or older and have had diabetes for 3-5 years. Yearly eye exams are recommended after that initial eye exam.  If you  are diagnosed with type 1 diabetes as an adult, get an exam within 5 years of diagnosis and then yearly.  If you are diagnosed with type 2 diabetes, get an exam as soon as possible after the diagnosis and then yearly. Foot care exam  Visual foot exams are performed at every routine medical visit. The exams check for cuts, injuries, or other problems with the feet.  A comprehensive foot exam should be done yearly. This includes visual inspection as well as assessing foot pulses and testing for loss of sensation.  Check your feet nightly for cuts, injuries, or other problems with your feet. Tell your health care provider if anything is not healing. Kidney function test (urine microalbumin)  This test is performed once a year.  Type 1 diabetes: The first test is performed 5 years after diagnosis.  Type 2 diabetes: The first test is performed at the time of diagnosis.  A serum creatinine and estimated glomerular filtration rate (eGFR) test is done once a year to assess the level of chronic kidney disease (CKD), if present. Lipid profile (cholesterol, HDL, LDL, triglycerides)  Performed every 5 years for most people.  The goal for LDL is less than 100 mg/dL. If you are at high risk, the goal is less than 70 mg/dL.  The goal for HDL is 40 mg/dL-50 mg/dL for men and 50 mg/dL-60 mg/dL for women. An HDL cholesterol of 60 mg/dL or higher gives some protection against heart disease.  The goal for triglycerides is less than 150 mg/dL. Influenza vaccine, pneumococcal vaccine, and hepatitis B vaccine  The influenza vaccine is recommended yearly.  It is recommended that people with diabetes who are over 63 years old get the pneumonia vaccine. In some cases, two separate shots may be given. Ask your health care provider if your pneumonia vaccination is up to date.  The hepatitis B vaccine is also recommended for adults with diabetes. Diabetes self-management education  Education is recommended  at diagnosis and ongoing as needed. Treatment plan  Your treatment plan is reviewed at every medical visit. Document Released: 02/19/2009 Document Revised: 09/08/2013 Document Reviewed: 09/24/2012 [facility] Patient Information 2015 [place], [place]. This information is not intended to replace advice given to you by your health care provider. Make sure you discuss any questions you have with your health care provider.  Follow up with PCP. Return to the ED if you experience fevers, difficulty breathing, vomiting, diarrhea, numbness, weakness.  Check blood sugar at home daily after meals. Take diabetic medication as prescribed.

## 2015-01-20 NOTE — ED Notes (Signed)
Pt sent by PCP for hyperglycemia, SOB, recurrent CP x1 month. Reports bilateral leg swelling x1 week. Denies N/V/D.

## 2015-01-20 NOTE — ED Provider Notes (Signed)
CSN: [id number]     Arrival date & time 01/20/15  1618 History   First MD Initiated Contact with Patient 01/20/15 1944     Chief Complaint  Patient presents with  . Hyperglycemia     (Consider location/radiation/quality/duration/timing/severity/associated sxs/prior Treatment) HPI Comments: [name] is a 80 y.o F with a pmhx of active breast cancer, type 2 diabetes, CHF, MI patient's emergency department today complaining of shortness of breath and high blood sugar. Patient was seen in the breast cancer clinic earlier today and was sent to the emergency department due to her shortness of breath. Glucose also noted to be over 400. Patient states she has felt increasingly short of breath over the last 3 weeks. She is unable to catch her breath when she sits up the side of the bed. Patient is morbidly obese and sedentary. Patient states that her blood sugars have been between 305 100 the last week or 2. Patient does not check her blood sugar at home every daily she is supposed to. She also feels generally weak. Denies fever, vomiting, diarrhea, numbness, chest pain, lower extremity swelling him a headache, blurry vision. She has chronic abdominal pain for which she takes Percocet at home.  Patient is a 80 y.o. female presenting with hyperglycemia. The history is provided by the patient and a relative.  Hyperglycemia   Past Medical History  Diagnosis Date  . Hyperlipidemia     takes Pravastatin daily  . Coronary artery disease     a. s/p IMI 2004 tx with BMS to RCA;  b. s/p Promus DES to RCA 2/12 (cath: LM 20-30%, pLAD 20-30%, mLAD 50%, RI 30%, pRCA 60%, mRCA 90% - tx with PCI);  c. myoview 1/07: EF 49%, inf scar, no isch  . Urinary incontinence     takes Linzess daily  . Insomnia   . Osteoarthritis   . Obstructive sleep apnea   . Polymyalgia rheumatica   . Arthritis   . Pulmonary emboli 9/13    felt to need lifelong anticoagulation  . Allergy     takes Mucinex daily as needed  .  Gout, unspecified     takes Colchicine daily  . Dysuria   . Pain, chronic   . Osteoarthrosis, unspecified whether generalized or localized, lower leg   . Anemia, unspecified   . Coronary atherosclerosis of native coronary artery   . CHF (congestive heart failure)     takes Furosemide daily  . Depression     takes Cymbalta daily  . Anxiety     takes Clonazepam daily as needed  . Hypertension     takes Imdur and Metoprolol daily  . GERD (gastroesophageal reflux disease)     takes Protonix daily  . Diabetes mellitus     insulin daily  . Type II or unspecified type diabetes mellitus without mention of complication, uncontrolled   . Myocardial infarction 2004  . History of blood clots about 34yrs ago    in legs-takes Coumadin   . Shortness of breath dyspnea     with exertion and has Albuterol inhaler prn  . Headache     occasionally  . Vertigo     hx of;was taking Meclizine if needed  . Numbness   . Joint pain   . Joint swelling   . Osteoarthritis    Past Surgical History  Procedure Laterality Date  . Appendectomy    . Cardiac catheterization    . Gastric bypass  1977     reversed in  1979, [facility]  . Blood clots/legs and lungs  2013  . Mi with stent placement  2004  . Left heart catheterization with coronary angiogram N/A 06/29/2014    Procedure: LEFT HEART CATHETERIZATION WITH CORONARY ANGIOGRAM;  Surgeon: Troy Sine, MD;  Location: [facility] CATH LAB;  Service: Cardiovascular;  Laterality: N/A;  . Coronary angioplasty  2  . Eye surgery Bilateral     cataract   . Colonoscopy    . Abdominal hysterectomy      partial  . Breast lumpectomy with radioactive seed localization Left 11/05/2014    Procedure: LEFT BREAST LUMPECTOMY WITH RADIOACTIVE SEED LOCALIZATION;  Surgeon: [name], MD;  Location: [place];  Service: General;  Laterality: Left;  . Excision of skin tag Right 11/05/2014    Procedure: EXCISION OF RIGHT EYELID SKIN TAG;  Surgeon: [name], MD;   Location: [place];  Service: General;  Laterality: Right;   Family History  Problem Relation Age of Onset  . Breast cancer Mother 73  . Heart disease Mother   . Throat cancer Father   . Hypertension Father   . Arthritis Father   . Diabetes Father   . Arthritis Sister   . Obesity Sister   . Diabetes Sister    Social History  Substance Use Topics  . Smoking status: Never Smoker   . Smokeless tobacco: None  . Alcohol Use: No   OB History    No data available     Review of Systems  All other systems reviewed and are negative.     Allergies  Sulfonamide derivatives and Tramadol  Home Medications   Prior to Admission medications   Medication Sig Start Date End Date Taking? Authorizing Provider  albuterol (PROVENTIL HFA;VENTOLIN HFA) 108 (90 BASE) MCG/ACT inhaler Inhale 2 puffs into the lungs every 6 (six) hours as needed for wheezing or shortness of breath. 05/05/14  Yes Mahima Bubba Camp, MD  allopurinol (ZYLOPRIM) 300 MG tablet Take 300 mg by mouth. 11/25/14  Yes Historical Provider, MD  anastrozole (ARIMIDEX) 1 MG tablet Take 1 tablet (1 mg total) by mouth daily. 01/20/15  Yes [name], MD  budesonide-formoterol [facility]) 160-4.5 MCG/ACT inhaler In 2 puffs twice daily to help wheezing 09/23/14  Yes Estill Dooms, MD  Calcium Carbonate Antacid (TUMS PO) Take 3 tablets by mouth daily as needed (acid reflux).   Yes Historical Provider, MD  clonazePAM (KLONOPIN) 1 MG tablet TAKE TWO TABLETS BY MOUTH AT BEDTIME FOR SLEEP 01/04/15  Yes Tiffany L Reed, DO  diphenhydrAMINE (BENADRYL) 25 MG tablet Take 50 mg by mouth daily as needed for itching. For itching   Yes Historical Provider, MD  DULoxetine (CYMBALTA) 60 MG capsule Take 1 capsule by mouth every day for anxiety 12/14/14  Yes Tiffany L Reed, DO  ergocalciferol (VITAMIN D2) 50000 UNITS capsule Take 1 capsule (50,000 Units total) by mouth once a week. 12/17/14  Yes [name], MD  furosemide (LASIX) 20 MG tablet Take  one tablet by mouth twice daily for edema Patient taking differently: Take 20 mg by mouth daily. Take one tablet by mouth twice daily for edema 12/14/14  Yes Tiffany L Reed, DO  Insulin Glargine (TOUJEO SOLOSTAR) 300 UNIT/ML SOPN Inject 45 Units into the skin 2 (two) times daily before a meal. Titrate up as needed. 10/07/14  Yes Estill Dooms, MD  isosorbide mononitrate (IMDUR) 60 MG 24 hr tablet TAKE ONE TABLET BY MOUTH ONCE DAILY 12/07/14  Yes Gildardo Cranker, DO  Lancets (ONETOUCH ULTRASOFT) lancets Use to test blood sugar Dx: E11.9 10/01/14  Yes Gildardo Cranker, DO  meclizine (ANTIVERT) 25 MG tablet Take 1 tablet (25 mg total) by mouth 3 (three) times daily. Patient taking differently: Take 25 mg by mouth 2 (two) times daily.  09/26/14  Yes Barton Dubois, MD  metoprolol succinate (TOPROL-XL) 25 MG 24 hr tablet Take 1 tablet by mouth once every day for blood pressure 12/14/14  Yes Tiffany L Reed, DO  nitroGLYCERIN (NITROSTAT) 0.4 MG SL tablet Take one tablet under the tongue every 5 minutes as needed for chest pain Patient taking differently: Place 0.4 mg under the tongue every 5 (five) minutes as needed for chest pain.  09/24/13  Yes [name], NP  oxyCODONE-acetaminophen (PERCOCET) 10-325 MG per tablet Take one tablet every 6 hours as needed for pain 01/19/15  Yes Estill Dooms, MD  pantoprazole (PROTONIX) 40 MG tablet TAKE ONE TABLET BY MOUTH ONCE DAILY 08/11/14  Yes Gildardo Cranker, DO  pravastatin (PRAVACHOL) 20 MG tablet Take one tablet by mouth once daily for cholesterol 01/18/15  Yes Tiffany L Reed, DO  temazepam (RESTORIL) 30 MG capsule Take 1 capsule (30 mg total) by mouth at bedtime as needed for sleep. 01/18/15  Yes Tiffany L Reed, DO  warfarin (COUMADIN) 5 MG tablet Take 1 & 1/2 tablets daily except 1 tablet on Mondays, Wednesdays and Fridays 01/18/15  Yes Cathey Miller, RPH-CPP  Insulin Lispro, Human, (HUMALOG KWIKPEN) 200 UNIT/ML SOPN Inject 35 Units into the skin 3 (three) times daily before  meals. Patient taking differently: Inject 35 Units into the skin See admin instructions. Inject 35 units at each meal (most days twice daily, occasionally 3 times daily) 09/07/14   Tivis Ringer, RPH-CPP   BP 149/76 mmHg  Pulse 74  Temp(Src) 98 F (36.7 C) (Oral)  Resp 17  SpO2 99% Physical Exam  Constitutional: She is oriented to person, place, and time. She appears well-developed and well-nourished. No distress.  Pt is morbidly obese, laying in bed.  HENT:  Head: Normocephalic and atraumatic.  Mouth/Throat: Oropharynx is clear and moist. No oropharyngeal exudate.  Eyes: Conjunctivae and EOM are normal. Pupils are equal, round, and reactive to light. Right eye exhibits no discharge. Left eye exhibits no discharge. No scleral icterus.  Cardiovascular: Normal rate, regular rhythm, normal heart sounds and intact distal pulses.  Exam reveals no gallop and no friction rub.   No murmur heard. Pulmonary/Chest: Effort normal and breath sounds normal. No respiratory distress. She has no wheezes. She has no rales. She exhibits no tenderness.  Abdominal: Soft. Bowel sounds are normal. She exhibits no distension and no mass. There is no tenderness. There is no rebound and no guarding.  Musculoskeletal: Normal range of motion. She exhibits edema.  Bilateral non-pitting LE edema.  Lymphadenopathy:    She has no cervical adenopathy.  Neurological: She is alert and oriented to person, place, and time. No cranial nerve deficit.  Strength 5/5 throughout. No sensory deficits.   Skin: Skin is warm and dry. No rash noted. She is not diaphoretic. No erythema. No pallor.  Psychiatric: She has a normal mood and affect. Her behavior is normal.  Nursing note and vitals reviewed.   ED Course  Procedures (including critical care time)  Ct Angio ordered. High risk Well's Criteria.   Labs Review Labs Reviewed  BASIC METABOLIC PANEL - Abnormal; Notable for the following:    Sodium 131 (*)    Chloride 95 (*)  Glucose, Bld 490 (*)    BUN 24 (*)    Creatinine, Ser 1.36 (*)    GFR calc non Af Amer 36 (*)    GFR calc Af Amer 42 (*)    All other components within normal limits  CBC - Abnormal; Notable for the following:    Hemoglobin 10.6 (*)    HCT 34.1 (*)    All other components within normal limits  URINALYSIS, ROUTINE W REFLEX MICROSCOPIC (NOT AT [facility]) - Abnormal; Notable for the following:    APPearance CLOUDY (*)    Glucose, UA >1000 (*)    Leukocytes, UA TRACE (*)    All other components within normal limits  BLOOD GAS, VENOUS - Abnormal; Notable for the following:    pH, Ven 7.329 (*)    pO2, Ven 65.6 (*)    Bicarbonate 25.0 (*)    All other components within normal limits  HEPATIC FUNCTION PANEL - Abnormal; Notable for the following:    Albumin 3.4 (*)    Bilirubin, Direct <0.1 (*)    All other components within normal limits  URINE MICROSCOPIC-ADD ON - Abnormal; Notable for the following:    Bacteria, UA MANY (*)    All other components within normal limits  CBG MONITORING, ED - Abnormal; Notable for the following:    Glucose-Capillary 483 (*)    All other components within normal limits  CBG MONITORING, ED - Abnormal; Notable for the following:    Glucose-Capillary 451 (*)    All other components within normal limits  I-STAT CG4 LACTIC ACID, ED - Abnormal; Notable for the following:    Lactic Acid, Venous 2.06 (*)    All other components within normal limits  CBG MONITORING, ED - Abnormal; Notable for the following:    Glucose-Capillary 336 (*)    All other components within normal limits  BRAIN NATRIURETIC PEPTIDE  LIPASE, BLOOD  I-STAT TROPOININ, ED    Imaging Review Dg Chest 2 View  01/20/2015   CLINICAL DATA:  Shortness of breath, recurrent chest pain for 1 month.  EXAM: CHEST  2 VIEW  COMPARISON:  June 24, 2014  FINDINGS: The heart size and mediastinal contours are within normal limits. There are bilateral increased pulmonary interstitium. No focal  pneumonia or pleural effusion is identified. The visualized skeletal structures are stable. Surgical clips are identified in the epigastric region unchanged.  IMPRESSION: Mild interstitial pulmonary edema.   Electronically Signed   By: Abelardo Diesel M.D.   On: 01/20/2015 18:40   Ct Angio Chest Pe W/cm &/or Wo Cm  01/20/2015   CLINICAL DATA:  Shortness breath and recurrent chest pain 1 month. Bilateral lower extremity swelling 1 week. History of left breast cancer.  EXAM: CT ANGIOGRAPHY CHEST WITH CONTRAST  TECHNIQUE: Multidetector CT imaging of the chest was performed using the standard protocol during bolus administration of intravenous contrast. Multiplanar CT image reconstructions and MIPs were obtained to evaluate the vascular anatomy.  CONTRAST:  117mL OMNIPAQUE IOHEXOL 350 MG/ML SOLN  COMPARISON:  Chest CT 03/10/2009 and abdominal pelvic CT 04/21/2014  FINDINGS: Lungs are clear.  Airways are within normal  Heart size is normal. There is moderate three-vessel atherosclerotic coronary artery disease. Images somewhat degraded due to prominent overlying soft tissues. Pulmonary arterial system is otherwise within normal without evidence of emboli. No evidence of hilar, mediastinal or axillary adenopathy. Small hiatal hernia. Remaining mediastinal structures are within normal. Postsurgical changes over the left breast.  Images over the upper abdomen demonstrate  surgical clips in the region of the cast option of junction. There is a 4.1 cm simple cyst over the upper pole of the right kidney unchanged. There are degenerative changes of the spine.  Review of the MIP images confirms the above findings.  IMPRESSION: No evidence of pulmonary embolism. No acute cardiopulmonary disease.  Three vessel atherosclerotic coronary artery disease.  4.1 cm simple right renal cyst unchanged.  Postsurgical changes as described.   Electronically Signed   By: Marin Olp M.D.   On: 01/20/2015 21:50   I have personally reviewed  and evaluated these images and lab results as part of my medical decision-making.   EKG Interpretation None      MDM   Final diagnoses:  Hyperglycemia due to type 2 diabetes mellitus    Patient high risk well's criteria, complaining of shortness of breath. We'll order a CT angiogram of chest. CTA reveals no pulmonary embolism. Anion gap 8, lactate within normal limits. No concern for DKA. BNP within normal limits. CHF exacerbation unlikely. Glucose trending down after given insulin. Vital signs stable. Patient stable for discharge. Diabetes education provided. Discussed with patient and patient's family who are agreeable. Return precautions outlined inpatient discharge instructions. We will give albuterol for acute episodes of shortness of breath. Recommend follow-up with PCP.    Dondra Spry [place], PA-C 01/21/15 0023  Leo Grosser, MD 01/21/15 [phone]

## 2015-01-20 NOTE — ED Notes (Signed)
Pt transferred to CT.

## 2015-01-21 LAB — BLOOD GAS, VENOUS
Acid-base deficit: 0.7 mmol/L (ref 0.0–2.0)
Bicarbonate: 25 meq/L — ABNORMAL HIGH (ref 20.0–24.0)
FIO2: 0.21
O2 Saturation: 90 %
Patient temperature: 98.6
TCO2: 23.5 mmol/L (ref 0–100)
pCO2, Ven: 48.9 mmHg (ref 45.0–50.0)
pH, Ven: 7.329 — ABNORMAL HIGH (ref 7.250–7.300)
pO2, Ven: 65.6 mmHg — ABNORMAL HIGH (ref 30.0–45.0)

## 2015-01-22 ENCOUNTER — Telehealth: Payer: Self-pay | Admitting: Hematology

## 2015-01-22 NOTE — Telephone Encounter (Signed)
per pof tos ch pt appt-cld & spoke to pt & gave pt appt time & date

## 2015-01-25 ENCOUNTER — Telehealth: Payer: Self-pay | Admitting: *Deleted

## 2015-01-25 NOTE — Telephone Encounter (Signed)
Pt called in question about her appt.  Informed her that she is not scheduled to see Dr. Irene Limbo until December and if she needed anything else to let us know.

## 2015-01-28 ENCOUNTER — Encounter: Payer: Self-pay | Admitting: *Deleted

## 2015-01-28 NOTE — Progress Notes (Signed)
[place] Psychosocial Distress Screening Clinical Social Work  Clinical Social Work was referred by distress screening protocol.  The patient scored a 10 on the Psychosocial Distress Thermometer which indicates severe distress. Clinical Social Worker phoned pt at home to assess for distress and other psychosocial needs. Pt reports her daughter filled out the form and she was not aware of these concerns. CSW then spoke to daughter and she reports concerns with copays. CSW advised they could contact [employer] and see what their out of pocket maximum is in order to plan for this in advance. Pt is aware of how to look at other medicare plans soon for the next year. Pt denied concerns with depression and anxiety at this time. They agree to reach out as needed.   ONCBCN DISTRESS SCREENING 01/20/2015  Screening Type Initial Screening  Distress experienced in past week (1-10) 10  Practical problem type Insurance  Emotional problem type Depression;Nervousness/Anxiety;Adjusting to illness  Physical Problem type Pain;Sleep/insomnia;Getting around;Bathing/dressing;Breathing;Constipation/diarrhea;Changes in urination;Tingling hands/feet;Skin dry/itchy;Swollen arms/legs     Clinical Social Worker follow up needed: No.  If yes, follow up plan:  Loren Racer, [place]  [facility] Phone: [phone] Fax: [phone]

## 2015-01-29 NOTE — Progress Notes (Signed)
.    Hematology oncology Clinic FOllowup Note  Date of service 01/20/2015   Patient Care Team: Gildardo Cranker, DO as PCP - General (Internal Medicine) Renella Cunas, MD as Consulting Physician (Cardiology) [name], MD as Consulting Physician (Ophthalmology) [name], MD as Consulting Physician (General Surgery)  CHIEF COMPLAINTS/PURPOSE OF CONSULTATION:  F/u for breast cancer.  DIAGNOSIS: left-sided presumed stage IA  (pT1c,pNx[cN0], cM0) invasive ductal carcinoma grade 2 out of 3, strongly ER +100%, strongly PR +100% and HER-2/neu negative. Given her high risk cardiac status lumpectomy had to be done under local anesthesia and sentinel lymph node biopsy was not possible. She has accompanying DCIS of intermediate grade present at margins. ECOG performance status is 3.  CURRENT TREATMENT: Arimidex 59m po daily  HISTORY OF PRESENTING ILLNESS: -please see my initial consultation for details of intial presentation  INTERVAL HISTORY  [name] here for her scheduled followup of her breast cancer.  She has been on Arimidex last one month.  Notes no acute hot flashes or significant musculoskeletal pains.  No nausea.  Appears to be tolerating the Arimidex well. While in her labs were noted to show significant hyperglycemia with a blood sugar of 529. Patient reports that she has stopped taking her short-acting insulin as per her primary care physician.  She also notes significant chest tightness and increasing orthopnea.  She was referred to the emergency room for evaluation of her cardiac status given her significant history of coronary artery disease and known positive stress test as well as the possibility of hyperosmolar state.  MEDICAL HISTORY:  Past Medical History  Diagnosis Date  . Hyperlipidemia     takes Pravastatin daily  . Coronary artery disease     a. s/p IMI 2004 tx with BMS to RCA;  b. s/p Promus DES to RCA 2/12 (cath: LM 20-30%, pLAD 20-30%, mLAD 50%, RI 30%,  pRCA 60%, mRCA 90% - tx with PCI);  c. myoview 1/07: EF 49%, inf scar, no isch  . Urinary incontinence     takes Linzess daily  . Insomnia   . Osteoarthritis   . Obstructive sleep apnea   . Polymyalgia rheumatica   . Arthritis   . Pulmonary emboli 9/13    felt to need lifelong anticoagulation  . Allergy     takes Mucinex daily as needed  . Gout, unspecified     takes Colchicine daily  . Dysuria   . Pain, chronic   . Osteoarthrosis, unspecified whether generalized or localized, lower leg   . Anemia, unspecified   . Coronary atherosclerosis of native coronary artery   . CHF (congestive heart failure)     takes Furosemide daily  . Depression     takes Cymbalta daily  . Anxiety     takes Clonazepam daily as needed  . Hypertension     takes Imdur and Metoprolol daily  . GERD (gastroesophageal reflux disease)     takes Protonix daily  . Diabetes mellitus     insulin daily  . Type II or unspecified type diabetes mellitus without mention of complication, uncontrolled   . Myocardial infarction 2004  . History of blood clots about 542yrago    in legs-takes Coumadin   . Shortness of breath dyspnea     with exertion and has Albuterol inhaler prn  . Headache     occasionally  . Vertigo     hx of;was taking Meclizine if needed  . Numbness   . Joint pain   .  Joint swelling   . Osteoarthritis     SURGICAL HISTORY: Past Surgical History  Procedure Laterality Date  . Appendectomy    . Cardiac catheterization    . Gastric bypass  1977     reversed in 1979, [facility]  . Blood clots/legs and lungs  2013  . Mi with stent placement  2004  . Left heart catheterization with coronary angiogram N/A 06/29/2014    Procedure: LEFT HEART CATHETERIZATION WITH CORONARY ANGIOGRAM;  Surgeon: Troy Sine, MD;  Location: [facility] CATH LAB;  Service: Cardiovascular;  Laterality: N/A;  . Coronary angioplasty  2  . Eye surgery Bilateral     cataract   . Colonoscopy    . Abdominal hysterectomy       partial  . Breast lumpectomy with radioactive seed localization Left 11/05/2014    Procedure: LEFT BREAST LUMPECTOMY WITH RADIOACTIVE SEED LOCALIZATION;  Surgeon: [name], MD;  Location: [place];  Service: General;  Laterality: Left;  . Excision of skin tag Right 11/05/2014    Procedure: EXCISION OF RIGHT EYELID SKIN TAG;  Surgeon: [name], MD;  Location: [place];  Service: General;  Laterality: Right;    SOCIAL HISTORY: Social History   Social History  . Marital Status: Widowed    Spouse Name: N/A  . Number of Children: N/A  . Years of Education: N/A   Occupational History  . retired    Social History Main Topics  . Smoking status: Never Smoker   . Smokeless tobacco: Not on file  . Alcohol Use: No  . Drug Use: No  . Sexual Activity: Not Currently   Other Topics Concern  . Not on file   Social History Narrative    FAMILY HISTORY: Family History  Problem Relation Age of Onset  . Breast cancer Mother 33  . Heart disease Mother   . Throat cancer Father   . Hypertension Father   . Arthritis Father   . Diabetes Father   . Arthritis Sister   . Obesity Sister   . Diabetes Sister     ALLERGIES:  is allergic to sulfonamide derivatives and tramadol.  MEDICATIONS:  Current Outpatient Prescriptions  Medication Sig Dispense Refill  . albuterol (PROVENTIL HFA;VENTOLIN HFA) 108 (90 BASE) MCG/ACT inhaler Inhale 2 puffs into the lungs every 6 (six) hours as needed for wheezing or shortness of breath. 1 Inhaler 0  . albuterol (PROVENTIL HFA;VENTOLIN HFA) 108 (90 BASE) MCG/ACT inhaler Inhale 1-2 puffs into the lungs every 6 (six) hours as needed for wheezing or shortness of breath. 1 Inhaler 0  . allopurinol (ZYLOPRIM) 300 MG tablet Take 300 mg by mouth.    [name] anastrozole (ARIMIDEX) 1 MG tablet Take 1 tablet (1 mg total) by mouth daily. 30 tablet 11  . budesonide-formoterol (SYMBICORT) 160-4.5 MCG/ACT inhaler In 2 puffs twice daily to help wheezing 1 Inhaler 3  .  Calcium Carbonate Antacid (TUMS PO) Take 3 tablets by mouth daily as needed (acid reflux).    . clonazePAM (KLONOPIN) 1 MG tablet TAKE TWO TABLETS BY MOUTH AT BEDTIME FOR SLEEP 60 tablet 0  . diphenhydrAMINE (BENADRYL) 25 MG tablet Take 50 mg by mouth daily as needed for itching. For itching    . DULoxetine (CYMBALTA) 60 MG capsule Take 1 capsule by mouth every day for anxiety 90 capsule 1  . ergocalciferol (VITAMIN D2) 50000 UNITS capsule Take 1 capsule (50,000 Units total) by mouth once a week. 8 capsule 0  . furosemide (LASIX) 20 MG tablet  Take one tablet by mouth twice daily for edema (Patient taking differently: Take 20 mg by mouth daily. Take one tablet by mouth twice daily for edema) 180 tablet 1  . Insulin Glargine (TOUJEO SOLOSTAR) 300 UNIT/ML SOPN Inject 45 Units into the skin 2 (two) times daily before a meal. Titrate up as needed. 10 pen 11  . Insulin Lispro, Human, (HUMALOG KWIKPEN) 200 UNIT/ML SOPN Inject 35 Units into the skin 3 (three) times daily before meals. (Patient taking differently: Inject 35 Units into the skin See admin instructions. Inject 35 units at each meal (most days twice daily, occasionally 3 times daily)) 12 mL 6  . isosorbide mononitrate (IMDUR) 60 MG 24 hr tablet TAKE ONE TABLET BY MOUTH ONCE DAILY 90 tablet 1  . Lancets (ONETOUCH ULTRASOFT) lancets Use to test blood sugar Dx: E11.9 100 each 12  . meclizine (ANTIVERT) 25 MG tablet Take 1 tablet (25 mg total) by mouth 3 (three) times daily. (Patient taking differently: Take 25 mg by mouth 2 (two) times daily. ) 90 tablet 0  . metoprolol succinate (TOPROL-XL) 25 MG 24 hr tablet Take 1 tablet by mouth once every day for blood pressure 90 tablet 1  . nitroGLYCERIN (NITROSTAT) 0.4 MG SL tablet Take one tablet under the tongue every 5 minutes as needed for chest pain (Patient taking differently: Place 0.4 mg under the tongue every 5 (five) minutes as needed for chest pain. ) 50 tablet 0  . oxyCODONE-acetaminophen (PERCOCET)  10-325 MG per tablet Take one tablet every 6 hours as needed for pain 120 tablet 0  . pantoprazole (PROTONIX) 40 MG tablet TAKE ONE TABLET BY MOUTH ONCE DAILY 90 tablet 1  . pravastatin (PRAVACHOL) 20 MG tablet Take one tablet by mouth once daily for cholesterol 30 tablet 5  . temazepam (RESTORIL) 30 MG capsule Take 1 capsule (30 mg total) by mouth at bedtime as needed for sleep. 30 capsule 0  . warfarin (COUMADIN) 5 MG tablet Take 1 & 1/2 tablets daily except 1 tablet on Mondays, Wednesdays and Fridays 45 tablet 4   No current facility-administered medications for this visit.    REVIEW OF SYSTEMS:   Review of systems as noted above Remaining 10 point review of systems negative except as noted above.  PHYSICAL EXAMINATION: ECOG PERFORMANCE STATUS: 3 - Symptomatic, >50% confined to bed  Filed Vitals:   01/20/15 1453  BP: 173/71  Pulse: 76  Resp: 18   Filed Weights   01/20/15 1453  Weight: 355 lb 14.4 oz (161.435 kg)   GENERAL: Pleasant African-American lady, alert, no distress and comfortable, obese. SKIN: skin color, texture, turgor are normal, no rashes or significant lesions EYES: normal, conjunctiva are pink and non-injected, sclera clear OROPHARYNX: no exudate, no erythema and lips, buccal mucosa, and tongue normal  NECK: supple, thyroid normal size, non-tender, without nodularity LYMPH:  no palpable lymphadenopathy in the cervical, axillary or inguinal LUNGS: clear to auscultation normal breathing effort HEART: regular rate & rhythm and no murmurs and no lower extremity edema ABDOMEN:abdomen obese, soft, non-tender and normal bowel sounds Musculoskeletal:no cyanosis of digits and no clubbing  PSYCH: alert & oriented x 3 with fluent speech NEURO: no focal motor/sensory deficits  LABORATORY DATA:  I have reviewed the data as listed Lab Results  Component Value Date   WBC 9.0 01/20/2015   HGB 10.6* 01/20/2015   HCT 34.1* 01/20/2015   MCV 84.2 01/20/2015   PLT 292  01/20/2015    Recent Labs  10/29/14 1359  12/03/14 1149 12/16/14 1317 01/20/15 1400 01/20/15 1749  NA 136 140 135* 133* 131*  K 4.7 4.3 4.5 5.0 5.0  CL 101 97  --   --  95*  CO2 '27 21 26 27 28  ' GLUCOSE 313* 173* 294* 529* 490*  BUN 16 26 21.4 23.5 24*  CREATININE 1.34* 1.43* 1.4* 1.6* 1.36*  CALCIUM 8.7* 8.4* 8.6 9.1 8.9  GFRNONAA 37* 35*  --   --  36*  GFRAA 43* 40*  --   --  42*  PROT  --  7.3 8.0 7.8 8.0  ALBUMIN  --   --  3.2* 3.1* 3.4*  AST  --  '21 21 27 ' 38  ALT  --  '20 24 29 ' 34  ALKPHOS  --  88 99 95 82  BILITOT  --  <0.2 0.40 0.53 0.7  BILIDIR  --   --   --   --  <0.1*  IBILI  --   --   --   --  NOT CALCULATED    RADIOGRAPHIC STUDIES: I have personally reviewed the radiological images as listed and agreed with the findings in the report.  Mammogram digital diagnostic bilateral 03/24/2014: IMPRESSION: Suspicious intraductal left breast masses, with associated left breast calcifications.  RECOMMENDATION: Ultrasound-guided biopsy of the intraductal masses in the left breast is recommended. This is scheduled for 04/07/2014 at 3 p.m.  I have discussed the findings and recommendations with the patient. Results were also provided in writing at the conclusion of the visit. If applicable, a reminder letter will be sent to the patient regarding the next appointment.  BI-RADS CATEGORY 4: Suspicious.    ASSESSMENT & PLAN:   [name] is a very wonderful 80 year old African-American female with multiple medical comorbidities as described above with   #1Left-sided presumed stage IA  (pT1c,pNx[cN0], cM0) invasive ductal carcinoma grade 2 out of 3, strongly ER +100%, strongly PR +100% and HER-2/neu negative. Given her high risk cardiac status lumpectomy had to be done under local anesthesia and sentinel lymph node biopsy was not possible. She has accompanying DCIS of intermediate grade present at margins. ECOG performance status is 3. #2 significant coronary artery  disease with stress test in February 2016 suggesting likely multivessel disease. Has uncontrolled diabetes, hypertension, dyslipidemia, untreated sleep apnea, coronary disease, severe arthritis all of which are significantly limiting her quality of life. #3 vitamin D deficiency - 25OH vitamin D level of 10 #4 severe hypoglycemia with blood sugar more than 500 concern for hyperosmolar state #5 anginal chest tightness with orthopnea and weight gain rule out ACS/CHF decompensation Plan -patient was referred to the emergency department for evaluation and management for severe hyperglycemia and to evaluate her cardiac status given her concerning symptoms. -She appears to be tolerating her Arimidex well.  We'll continue Arimidex 1 mg by mouth daily. -DEXA scan was ordered for bone density testing -which is currently pending.encouraged patient to go ahead and schedule this. -cont on high-dose vitamin D to treat her vitamin D deficiency: Ergocalciferol 50,000 units weekly 8 doses followed by vitamin D 2000 international units daily along with calcium 1000 mg daily. -continue Coumadin monitoring with her Coumadin clinic  #4 postmenopausal vaginal bleeding -patient notes that she is following up with GYN for this and was told that she has an endometrial polyp. That is ongoing discussion about whether this needs to be removed under local anesthesia.  Patient escorted by RN to ED today.  Return to care with Dr. Irene Limbo in 3 months  with CBC, CMP  Continue follow-up with your primary care physician Dr. Eulas Post for other ongoing cares.  I appreciate the privilege of taking care of this wonderful person.  Sullivan Lone MD [place] Hematology/Oncology Physician [facility]  (Office):       [phone] (Work cell):  [phone] (Fax):           [phone]

## 2015-02-04 ENCOUNTER — Ambulatory Visit
Admission: RE | Admit: 2015-02-04 | Discharge: 2015-02-04 | Disposition: A | Discharge status: Home / Self Care | Payer: Medicare PPO | Source: Ambulatory Visit | Attending: Hematology | Admitting: Hematology

## 2015-02-04 DIAGNOSIS — M8580 Other specified disorders of bone density and structure, unspecified site: Secondary | ICD-10-CM

## 2015-02-05 ENCOUNTER — Encounter: Payer: Self-pay | Admitting: Internal Medicine

## 2015-02-05 ENCOUNTER — Ambulatory Visit (INDEPENDENT_AMBULATORY_CARE_PROVIDER_SITE_OTHER): Payer: Medicare PPO | Admitting: Internal Medicine

## 2015-02-05 VITALS — BP 120/88 | HR 100 | Temp 97.9°F | Resp 20 | Ht 67.0 in | Wt 355.8 lb

## 2015-02-05 DIAGNOSIS — Z794 Long term (current) use of insulin: Secondary | ICD-10-CM

## 2015-02-05 DIAGNOSIS — IMO0002 Reserved for concepts with insufficient information to code with codable children: Secondary | ICD-10-CM

## 2015-02-05 DIAGNOSIS — E785 Hyperlipidemia, unspecified: Secondary | ICD-10-CM

## 2015-02-05 DIAGNOSIS — E1169 Type 2 diabetes mellitus with other specified complication: Secondary | ICD-10-CM | POA: Diagnosis not present

## 2015-02-05 DIAGNOSIS — E1121 Type 2 diabetes mellitus with diabetic nephropathy: Secondary | ICD-10-CM

## 2015-02-05 DIAGNOSIS — I10 Essential (primary) hypertension: Secondary | ICD-10-CM | POA: Diagnosis not present

## 2015-02-05 DIAGNOSIS — E1165 Type 2 diabetes mellitus with hyperglycemia: Secondary | ICD-10-CM

## 2015-02-05 DIAGNOSIS — G894 Chronic pain syndrome: Secondary | ICD-10-CM

## 2015-02-05 DIAGNOSIS — Z7901 Long term (current) use of anticoagulants: Secondary | ICD-10-CM

## 2015-02-05 DIAGNOSIS — N390 Urinary tract infection, site not specified: Secondary | ICD-10-CM | POA: Diagnosis not present

## 2015-02-05 LAB — POCT URINALYSIS DIPSTICK
Glucose, UA: 2000
Nitrite, UA: POSITIVE
Protein, UA: 30
Spec Grav, UA: 1.02
Urobilinogen, UA: NEGATIVE
pH, UA: 5

## 2015-02-05 MED ORDER — CIPROFLOXACIN HCL 250 MG PO TABS: 250.0000 mg | ORAL_TABLET | Freq: Two times a day (BID) | ORAL | Status: DC

## 2015-02-05 MED ORDER — TEMAZEPAM 15 MG PO CAPS: 15.0000 mg | ORAL_CAPSULE | Freq: Every evening | ORAL | Status: DC | PRN

## 2015-02-05 NOTE — Patient Instructions (Addendum)
Increase restoril 45 mg at bedtime to help sleep  Recommend eat 3 meals per day so blood sugars will not fluctuate as much.  Increase Toujeo 55 units twice daily  Continue other medications as ordered  Follow up in 2 mos for routine visit  LAB APPT: 1 month for fasting labs

## 2015-02-05 NOTE — Progress Notes (Signed)
Patient ID: [name], female   DOB: 1935-01-07, 80 y.o.   MRN: [id number]    Location:    [name]   Place of Service:  OFFICE   Chief Complaint  Patient presents with  . Medical Management of Chronic Issues    Possible UTI    HPI:  80 yo female seen today for f/u. She c/o dysuria with end of urine stream. She had similar sx's in the past.   CAD/hyperlipidemia/HTN - stable on imdur, lasix, metoprolol. Has not needed SL NTG  Chronic bronchitis - reports SOB and not able to move around as much. She uses HFA prn  PE - on coumadin tx. No bleeding  Chronic pain syndrome/PMR/OA - stable overall. She reports increased pain in her chest wall that radiates from left breast to right.  Insomnia/depression/anxiety - not sleeping well even with medication restoril 32m. She gets about 4 hrs per night which is an improvement. She still takes klonopin  GERD - stable on PPI  DM - BS at home 200-300s. No low BS reactions. She admits to making poor food choices and "craves" sweets. She injects 50 units Toujeo BID and 25 units humalog with meals. She does not eat 3 full meals per day.  Breast CA - stable; takes arimidex daily  Past Medical History  Diagnosis Date  . Hyperlipidemia     takes Pravastatin daily  . Coronary artery disease     a. s/p IMI 2004 tx with BMS to RCA;  b. s/p Promus DES to RCA 2/12 (cath: LM 20-30%, pLAD 20-30%, mLAD 50%, RI 30%, pRCA 60%, mRCA 90% - tx with PCI);  c. myoview 1/07: EF 49%, inf scar, no isch  . Urinary incontinence     takes Linzess daily  . Insomnia   . Osteoarthritis   . Obstructive sleep apnea   . Polymyalgia rheumatica   . Arthritis   . Pulmonary emboli 9/13    felt to need lifelong anticoagulation  . Allergy     takes Mucinex daily as needed  . Gout, unspecified     takes Colchicine daily  . Dysuria   . Pain, chronic   . Osteoarthrosis, unspecified whether generalized or localized, lower leg   . Anemia, unspecified   . Coronary  atherosclerosis of native coronary artery   . CHF (congestive heart failure)     takes Furosemide daily  . Depression     takes Cymbalta daily  . Anxiety     takes Clonazepam daily as needed  . Hypertension     takes Imdur and Metoprolol daily  . GERD (gastroesophageal reflux disease)     takes Protonix daily  . Diabetes mellitus     insulin daily  . Type II or unspecified type diabetes mellitus without mention of complication, uncontrolled   . Myocardial infarction 2004  . History of blood clots about 544yrago    in legs-takes Coumadin   . Shortness of breath dyspnea     with exertion and has Albuterol inhaler prn  . Headache     occasionally  . Vertigo     hx of;was taking Meclizine if needed  . Numbness   . Joint pain   . Joint swelling   . Osteoarthritis     Past Surgical History  Procedure Laterality Date  . Appendectomy    . Cardiac catheterization    . Gastric bypass  1977     [facility]. Blood clots/legs and  lungs  2013  . Mi with stent placement  2004  . Left heart catheterization with coronary angiogram N/A 06/29/2014    Procedure: LEFT HEART CATHETERIZATION WITH CORONARY ANGIOGRAM;  Surgeon: Troy Sine, MD;  Location: [facility] CATH LAB;  Service: Cardiovascular;  Laterality: N/A;  . Coronary angioplasty  2  . Eye surgery Bilateral     cataract   . Colonoscopy    . Abdominal hysterectomy      partial  . Breast lumpectomy with radioactive seed localization Left 11/05/2014    Procedure: LEFT BREAST LUMPECTOMY WITH RADIOACTIVE SEED LOCALIZATION;  Surgeon: [name], MD;  Location: [place];  Service: General;  Laterality: Left;  . Excision of skin tag Right 11/05/2014    Procedure: EXCISION OF RIGHT EYELID SKIN TAG;  Surgeon: [name], MD;  Location: [place];  Service: General;  Laterality: Right;    Patient Care Team: Gildardo Cranker, DO as PCP - General (Internal Medicine) Renella Cunas, MD as Consulting Physician  (Cardiology) [name], MD as Consulting Physician (Ophthalmology) [name], MD as Consulting Physician (General Surgery)  Social History   Social History  . Marital Status: Widowed    Spouse Name: N/A  . Number of Children: N/A  . Years of Education: N/A   Occupational History  . retired    Social History Main Topics  . Smoking status: Never Smoker   . Smokeless tobacco: Never Used  . Alcohol Use: No  . Drug Use: No  . Sexual Activity: Not Currently   Other Topics Concern  . Not on file   Social History Narrative     reports that she has never smoked. She has never used smokeless tobacco. She reports that she does not drink alcohol or use illicit drugs.  Allergies  Allergen Reactions  . Sulfonamide Derivatives Swelling    Mouth swelling  . Tramadol Nausea And Vomiting    Medications: Patient's Medications  New Prescriptions   CIPROFLOXACIN (CIPRO) 250 MG TABLET    Take 1 tablet (250 mg total) by mouth 2 (two) times daily.   TEMAZEPAM (RESTORIL) 15 MG CAPSULE    Take 1 capsule (15 mg total) by mouth at bedtime as needed for sleep. Take with 46m capsule  Previous Medications   ALBUTEROL (PROVENTIL HFA;VENTOLIN HFA) 108 (90 BASE) MCG/ACT INHALER    Inhale 2 puffs into the lungs every 6 (six) hours as needed for wheezing or shortness of breath.   ALBUTEROL (PROVENTIL HFA;VENTOLIN HFA) 108 (90 BASE) MCG/ACT INHALER    Inhale 1-2 puffs into the lungs every 6 (six) hours as needed for wheezing or shortness of breath.   ALLOPURINOL (ZYLOPRIM) 300 MG TABLET    Take 300 mg by mouth.   ANASTROZOLE (ARIMIDEX) 1 MG TABLET    Take 1 tablet (1 mg total) by mouth daily.   BUDESONIDE-FORMOTEROL (SYMBICORT) 160-4.5 MCG/ACT INHALER    In 2 puffs twice daily to help wheezing   CALCIUM CARBONATE ANTACID (TUMS PO)    Take 3 tablets by mouth daily as needed (acid reflux).   CLONAZEPAM (KLONOPIN) 1 MG TABLET    TAKE TWO TABLETS BY MOUTH AT BEDTIME FOR SLEEP   DIPHENHYDRAMINE  (BENADRYL) 25 MG TABLET    Take 50 mg by mouth daily as needed for itching. For itching   DULOXETINE (CYMBALTA) 60 MG CAPSULE    Take 1 capsule by mouth every day for anxiety   ERGOCALCIFEROL (VITAMIN D2) 50000 UNITS CAPSULE    Take 1 capsule (50,000 Units  total) by mouth once a week.   FUROSEMIDE (LASIX) 20 MG TABLET    Take one tablet by mouth twice daily for edema   INSULIN GLARGINE (TOUJEO SOLOSTAR) 300 UNIT/ML SOPN    Inject 45 Units into the skin 2 (two) times daily before a meal. Titrate up as needed.   INSULIN LISPRO, HUMAN, (HUMALOG KWIKPEN) 200 UNIT/ML SOPN    Inject 35 Units into the skin 3 (three) times daily before meals.   ISOSORBIDE MONONITRATE (IMDUR) 60 MG 24 HR TABLET    TAKE ONE TABLET BY MOUTH ONCE DAILY   LANCETS (ONETOUCH ULTRASOFT) LANCETS    Use to test blood sugar Dx: E11.9   MECLIZINE (ANTIVERT) 25 MG TABLET    Take 1 tablet (25 mg total) by mouth 3 (three) times daily.   METOPROLOL SUCCINATE (TOPROL-XL) 25 MG 24 HR TABLET    Take 1 tablet by mouth once every day for blood pressure   NITROGLYCERIN (NITROSTAT) 0.4 MG SL TABLET    Take one tablet under the tongue every 5 minutes as needed for chest pain   OXYCODONE-ACETAMINOPHEN (PERCOCET) 10-325 MG PER TABLET    Take one tablet every 6 hours as needed for pain   PANTOPRAZOLE (PROTONIX) 40 MG TABLET    TAKE ONE TABLET BY MOUTH ONCE DAILY   PRAVASTATIN (PRAVACHOL) 20 MG TABLET    Take one tablet by mouth once daily for cholesterol   TEMAZEPAM (RESTORIL) 30 MG CAPSULE    Take 1 capsule (30 mg total) by mouth at bedtime as needed for sleep.   WARFARIN (COUMADIN) 5 MG TABLET    Take 1 & 1/2 tablets daily except 1 tablet on Mondays, Wednesdays and Fridays  Modified Medications   No medications on file  Discontinued Medications   No medications on file    Review of Systems  Constitutional: Negative for fever, chills, diaphoresis, activity change, appetite change and fatigue.  HENT: Negative for ear pain and sore throat.    Eyes: Negative for visual disturbance.  Respiratory: Positive for shortness of breath. Negative for cough and chest tightness.   Cardiovascular: Positive for chest pain. Negative for palpitations and leg swelling.  Gastrointestinal: Negative for nausea, vomiting, abdominal pain, diarrhea, constipation and blood in stool.  Genitourinary: Positive for dysuria.  Musculoskeletal: Positive for back pain, joint swelling, arthralgias and gait problem.  Neurological: Negative for dizziness, tremors, numbness and headaches.  Psychiatric/Behavioral: Positive for sleep disturbance. The patient is not nervous/anxious.     Filed Vitals:   02/05/15 1453  BP: 120/88  Pulse: 100  Temp: 97.9 F (36.6 C)  TempSrc: Oral  Resp: 20  Height: 5' 7" (1.702 m)  Weight: 355 lb 12.8 oz (161.39 kg)  SpO2: 96%   Body mass index is 55.71 kg/(m^2).  Physical Exam  Constitutional: She is oriented to person, place, and time. She appears well-developed and well-nourished.  Sitting in w/c in NAD. Her daughter, Di Kindle, is present  HENT:  Mouth/Throat: Oropharynx is clear and moist. No oropharyngeal exudate.  Eyes: Pupils are equal, round, and reactive to light. No scleral icterus.  Neck: Neck supple. Carotid bruit is not present. No tracheal deviation present. No thyromegaly present.  Cardiovascular: Normal rate, regular rhythm, normal heart sounds and intact distal pulses.  Exam reveals no gallop and no friction rub.   No murmur heard. (+) 1 pitting LE edema b/l. No calf TTP  Pulmonary/Chest: Effort normal and breath sounds normal. No stridor. No respiratory distress. She has no wheezes. She has  no rales. She exhibits tenderness (along sternum b/l).  Reduced BS at base b/l  Abdominal: Soft. Bowel sounds are normal. She exhibits no distension and no mass. There is no hepatomegaly. There is no tenderness. There is no rebound and no guarding.  Musculoskeletal: She exhibits edema and tenderness.  Lymphadenopathy:     She has no cervical adenopathy.  Neurological: She is alert and oriented to person, place, and time.  Skin: Skin is warm and dry. No rash noted.  Psychiatric: She has a normal mood and affect. Her behavior is normal. Thought content normal.     Labs reviewed: Office Visit on 02/05/2015  Component Date Value Ref Range Status  . Color, UA 02/05/2015 dark yellow   Final  . Clarity, UA 02/05/2015 cloudy   Final  . Glucose, UA 02/05/2015 2000 or more   Final  . Bilirubin, UA 02/05/2015 small   Final  . Ketones, UA 02/05/2015 trace   Final  . Spec Grav, UA 02/05/2015 1.020   Final  . Blood, UA 02/05/2015 hemolyzed trace   Final  . pH, UA 02/05/2015 5.0   Final  . Protein, UA 02/05/2015 30   Final  . Urobilinogen, UA 02/05/2015 negative   Final  . Nitrite, UA 02/05/2015 positive   Final  . Leukocytes, UA 02/05/2015 moderate (2+)* Negative Final  Admission on 01/20/2015, Discharged on 01/20/2015  Component Date Value Ref Range Status  . Glucose-Capillary 01/20/2015 483* 65 - 99 mg/dL Final  . Sodium 01/20/2015 131* 135 - 145 mmol/L Final  . Potassium 01/20/2015 5.0  3.5 - 5.1 mmol/L Final  . Chloride 01/20/2015 95* 101 - 111 mmol/L Final  . CO2 01/20/2015 28  22 - 32 mmol/L Final  . Glucose, Bld 01/20/2015 490* 65 - 99 mg/dL Final  . BUN 01/20/2015 24* 6 - 20 mg/dL Final  . Creatinine, Ser 01/20/2015 1.36* 0.44 - 1.00 mg/dL Final  . Calcium 01/20/2015 8.9  8.9 - 10.3 mg/dL Final  . GFR calc non Af Amer 01/20/2015 36* >60 mL/min Final  . GFR calc Af Amer 01/20/2015 42* >60 mL/min Final   Comment: (NOTE) The eGFR has been calculated using the CKD EPI equation. This calculation has not been validated in all clinical situations. eGFR's persistently <60 mL/min signify possible Chronic Kidney Disease.   . Anion gap 01/20/2015 8  5 - 15 Final  . WBC 01/20/2015 9.0  4.0 - 10.5 K/uL Final  . RBC 01/20/2015 4.05  3.87 - 5.11 MIL/uL Final  . Hemoglobin 01/20/2015 10.6* 12.0 - 15.0 g/dL  Final  . HCT 01/20/2015 34.1* 36.0 - 46.0 % Final  . MCV 01/20/2015 84.2  78.0 - 100.0 fL Final  . MCH 01/20/2015 26.2  26.0 - 34.0 pg Final  . MCHC 01/20/2015 31.1  30.0 - 36.0 g/dL Final  . RDW 01/20/2015 15.2  11.5 - 15.5 % Final  . Platelets 01/20/2015 292  150 - 400 K/uL Final  . Color, Urine 01/20/2015 YELLOW  YELLOW Final  . APPearance 01/20/2015 CLOUDY* CLEAR Final  . Specific Gravity, Urine 01/20/2015 1.028  1.005 - 1.030 Final  . pH 01/20/2015 6.0  5.0 - 8.0 Final  . Glucose, UA 01/20/2015 >1000* NEGATIVE mg/dL Final  . Hgb urine dipstick 01/20/2015 NEGATIVE  NEGATIVE Final  . Bilirubin Urine 01/20/2015 NEGATIVE  NEGATIVE Final  . Ketones, ur 01/20/2015 NEGATIVE  NEGATIVE mg/dL Final  . Protein, ur 01/20/2015 NEGATIVE  NEGATIVE mg/dL Final  . Urobilinogen, UA 01/20/2015 0.2  0.0 - 1.0  mg/dL Final  . Nitrite 01/20/2015 NEGATIVE  NEGATIVE Final  . Leukocytes, UA 01/20/2015 TRACE* NEGATIVE Final  . Glucose-Capillary 01/20/2015 451* 65 - 99 mg/dL Final  . Lactic Acid, Venous 01/20/2015 2.06* 0.5 - 2.0 mmol/L Final  . Comment 01/20/2015 NOTIFIED PHYSICIAN   Final  . FIO2 01/20/2015 0.21   Final  . Delivery systems 01/20/2015 ROOM AIR   Corrected  . pH, Ven 01/20/2015 7.329* 7.250 - 7.300 Final  . pCO2, Ven 01/20/2015 48.9  45.0 - 50.0 mmHg Final  . pO2, Ven 01/20/2015 65.6* 30.0 - 45.0 mmHg Final  . Bicarbonate 01/20/2015 25.0* 20.0 - 24.0 mEq/L Final  . TCO2 01/20/2015 23.5  0 - 100 mmol/L Final  . Acid-base deficit 01/20/2015 0.7  0.0 - 2.0 mmol/L Final  . O2 Saturation 01/20/2015 90.0   Final  . Patient temperature 01/20/2015 98.6   Final  . Collection site 01/20/2015 VEIN   Corrected  . Drawn by 01/20/2015 COLLECTED BY LABORATORY   Corrected  . Sample type 01/20/2015 VENOUS   Corrected  . B Natriuretic Peptide 01/20/2015 23.4  0.0 - 100.0 pg/mL Final  . Total Protein 01/20/2015 8.0  6.5 - 8.1 g/dL Final  . Albumin 01/20/2015 3.4* 3.5 - 5.0 g/dL Final  . AST 01/20/2015  38  15 - 41 U/L Final  . ALT 01/20/2015 34  14 - 54 U/L Final  . Alkaline Phosphatase 01/20/2015 82  38 - 126 U/L Final  . Total Bilirubin 01/20/2015 0.7  0.3 - 1.2 mg/dL Final  . Bilirubin, Direct 01/20/2015 <0.1* 0.1 - 0.5 mg/dL Final  . Indirect Bilirubin 01/20/2015 NOT CALCULATED  0.3 - 0.9 mg/dL Final  . Lipase 01/20/2015 22  22 - 51 U/L Final  . Troponin i, poc 01/20/2015 0.00  0.00 - 0.08 ng/mL Final  . Comment 3 01/20/2015          Final   Comment: Due to the release kinetics of cTnI, a negative result within the first hours of the onset of symptoms does not rule out myocardial infarction with certainty. If myocardial infarction is still suspected, repeat the test at appropriate intervals.   . Squamous Epithelial / LPF 01/20/2015 RARE  RARE Final  . WBC, UA 01/20/2015 21-50  <3 WBC/hpf Final   WITH CLUMPS  . Bacteria, UA 01/20/2015 MANY* RARE Final  . Glucose-Capillary 01/20/2015 336* 65 - 99 mg/dL Final  Appointment on 01/20/2015  Component Date Value Ref Range Status  . WBC 01/20/2015 6.3  3.9 - 10.3 10e3/uL Final  . NEUT# 01/20/2015 3.6  1.5 - 6.5 10e3/uL Final  . HGB 01/20/2015 10.2* 11.6 - 15.9 g/dL Final  . HCT 01/20/2015 32.7* 34.8 - 46.6 % Final  . Platelets 01/20/2015 282  145 - 400 10e3/uL Final  . MCV 01/20/2015 83.4  79.5 - 101.0 fL Final  . MCH 01/20/2015 25.9  25.1 - 34.0 pg Final  . MCHC 01/20/2015 31.1* 31.5 - 36.0 g/dL Final  . RBC 01/20/2015 3.92  3.70 - 5.45 10e6/uL Final  . RDW 01/20/2015 16.4* 11.2 - 14.5 % Final  . lymph# 01/20/2015 1.9  0.9 - 3.3 10e3/uL Final  . MONO# 01/20/2015 0.6  0.1 - 0.9 10e3/uL Final  . Eosinophils Absolute 01/20/2015 0.2  0.0 - 0.5 10e3/uL Final  . Basophils Absolute 01/20/2015 0.0  0.0 - 0.1 10e3/uL Final  . NEUT% 01/20/2015 57.6  38.4 - 76.8 % Final  . LYMPH% 01/20/2015 30.1  14.0 - 49.7 % Final  . MONO% 01/20/2015  8.9  0.0 - 14.0 % Final  . EOS% 01/20/2015 2.9  0.0 - 7.0 % Final  . BASO% 01/20/2015 0.5  0.0 - 2.0 %  Final  . Sodium 01/20/2015 133* 136 - 145 mEq/L Final  . Potassium 01/20/2015 5.0  3.5 - 5.1 mEq/L Final  . Chloride 01/20/2015 97* 98 - 109 mEq/L Final  . CO2 01/20/2015 27  22 - 29 mEq/L Final  . Glucose 01/20/2015 529* 70 - 140 mg/dl Final   Glucose reference range is for nonfasting patients. Fasting glucose reference range is 70- 100.  [name] BUN 01/20/2015 23.5  7.0 - 26.0 mg/dL Final  . Creatinine 01/20/2015 1.6* 0.6 - 1.1 mg/dL Final  . Total Bilirubin 01/20/2015 0.53  0.20 - 1.20 mg/dL Final  . Alkaline Phosphatase 01/20/2015 95  40 - 150 U/L Final  . AST 01/20/2015 27  5 - 34 U/L Final  . ALT 01/20/2015 29  0 - 55 U/L Final  . Total Protein 01/20/2015 7.8  6.4 - 8.3 g/dL Final  . Albumin 01/20/2015 3.1* 3.5 - 5.0 g/dL Final  . Calcium 01/20/2015 9.1  8.4 - 10.4 mg/dL Final  . Anion Gap 01/20/2015 9  3 - 11 mEq/L Final  . EGFR 01/20/2015 35* >90 ml/min/1.73 m2 Final   eGFR is calculated using the CKD-EPI Creatinine Equation (2009)  Office Visit on 01/18/2015  Component Date Value Ref Range Status  . INR 01/18/2015 3.8   Final  Appointment on 12/16/2014  Component Date Value Ref Range Status  . WBC 12/16/2014 7.1  3.9 - 10.3 10e3/uL Final  . NEUT# 12/16/2014 3.6  1.5 - 6.5 10e3/uL Final  . HGB 12/16/2014 10.4* 11.6 - 15.9 g/dL Final  . HCT 12/16/2014 32.9* 34.8 - 46.6 % Final  . Platelets 12/16/2014 251  145 - 400 10e3/uL Final  . MCV 12/16/2014 83.9  79.5 - 101.0 fL Final  . MCH 12/16/2014 26.5  25.1 - 34.0 pg Final  . MCHC 12/16/2014 31.6  31.5 - 36.0 g/dL Final  . RBC 12/16/2014 3.92  3.70 - 5.45 10e6/uL Final  . RDW 12/16/2014 15.9* 11.2 - 14.5 % Final  . lymph# 12/16/2014 2.8  0.9 - 3.3 10e3/uL Final  . MONO# 12/16/2014 0.6  0.1 - 0.9 10e3/uL Final  . Eosinophils Absolute 12/16/2014 0.2  0.0 - 0.5 10e3/uL Final  . Basophils Absolute 12/16/2014 0.0  0.0 - 0.1 10e3/uL Final  . NEUT% 12/16/2014 49.7  38.4 - 76.8 % Final  . LYMPH% 12/16/2014 38.8  14.0 - 49.7 % Final  .  MONO% 12/16/2014 7.8  0.0 - 14.0 % Final  . EOS% 12/16/2014 3.4  0.0 - 7.0 % Final  . BASO% 12/16/2014 0.3  0.0 - 2.0 % Final  . Retic % 12/16/2014 1.32  0.70 - 2.10 % Final  . Retic Ct Abs 12/16/2014 51.74  33.70 - 90.70 10e3/uL Final  . Immature Retic Fract 12/16/2014 10.90* 1.60 - 10.00 % Final  . Sodium 12/16/2014 135* 136 - 145 mEq/L Final  . Potassium 12/16/2014 4.5  3.5 - 5.1 mEq/L Final  . Chloride 12/16/2014 102  98 - 109 mEq/L Final  . CO2 12/16/2014 26  22 - 29 mEq/L Final  . Glucose 12/16/2014 294* 70 - 140 mg/dl Final  . BUN 12/16/2014 21.4  7.0 - 26.0 mg/dL Final  . Creatinine 12/16/2014 1.4* 0.6 - 1.1 mg/dL Final  . Total Bilirubin 12/16/2014 0.40  0.20 - 1.20 mg/dL Final  . Alkaline Phosphatase 12/16/2014 99  40 - 150 U/L Final  . AST 12/16/2014 21  5 - 34 U/L Final  . ALT 12/16/2014 24  0 - 55 U/L Final  . Total Protein 12/16/2014 8.0  6.4 - 8.3 g/dL Final  . Albumin 12/16/2014 3.2* 3.5 - 5.0 g/dL Final  . Calcium 12/16/2014 8.6  8.4 - 10.4 mg/dL Final  . Anion Gap 12/16/2014 7  3 - 11 mEq/L Final  . EGFR 12/16/2014 42* >90 ml/min/1.73 m2 Final   eGFR is calculated using the CKD-EPI Creatinine Equation (2009)  . Vit D, 25-Hydroxy 12/16/2014 10* 30 - 100 ng/mL Final   Comment: Vitamin D Status           25-OH Vitamin D       Deficiency                <20 ng/mL       Insufficiency         20 - 29 ng/mL       Optimal             > or = 30 ng/mL For 25-OH Vitamin D testing on patients on D2-supplementation andpatients for whom  quantitation of D2 and D3 fractions is required, the[employer]D 25-OH VIT D, (D2,D3), LC/MS/MS is recommended: order [id number] (patients > 2 yrs).   Office Visit on 12/14/2014  Component Date Value Ref Range Status  . INR 12/14/2014 3.1   Final  Anti-coag visit on 12/10/2014  Component Date Value Ref Range Status  . INR 12/10/2014 3.9   Final  Appointment on 12/03/2014  Component Date Value Ref Range Status  . Hgb A1c MFr Bld 12/03/2014 10.5*  4.8 - 5.6 % Final   Comment:          Pre-diabetes: 5.7 - 6.4          Diabetes: >6.4          Glycemic control for adults with diabetes: <7.0   . Est. average glucose Bld gHb Est-m* 12/03/2014 255   Final  . Glucose 12/03/2014 173* 65 - 99 mg/dL Final  . BUN 12/03/2014 26  8 - 27 mg/dL Final  . Creatinine, Ser 12/03/2014 1.43* 0.57 - 1.00 mg/dL Final  . GFR calc non Af Amer 12/03/2014 35* >59 mL/min/1.73 Final  . GFR calc Af Amer 12/03/2014 40* >59 mL/min/1.73 Final  . BUN/Creatinine Ratio 12/03/2014 18  11 - 26 Final  . Sodium 12/03/2014 140  134 - 144 mmol/L Final  . Potassium 12/03/2014 4.3  3.5 - 5.2 mmol/L Final  . Chloride 12/03/2014 97  97 - 108 mmol/L Final  . CO2 12/03/2014 21  18 - 29 mmol/L Final  . Calcium 12/03/2014 8.4* 8.7 - 10.3 mg/dL Final  . Total Protein 12/03/2014 7.3  6.0 - 8.5 g/dL Final  . Albumin 12/03/2014 3.5  3.5 - 4.8 g/dL Final  . Globulin, Total 12/03/2014 3.8  1.5 - 4.5 g/dL Final  . Albumin/Globulin Ratio 12/03/2014 0.9* 1.1 - 2.5 Final  . Bilirubin Total 12/03/2014 <0.2  0.0 - 1.2 mg/dL Final  . Alkaline Phosphatase 12/03/2014 88  39 - 117 IU/L Final  . AST 12/03/2014 21  0 - 40 IU/L Final  . ALT 12/03/2014 20  0 - 32 IU/L Final  . Uric Acid 12/03/2014 8.6* 2.5 - 7.1 mg/dL Final              Therapeutic target for gout patients: <6.0  Anti-coag visit on 11/24/2014  Component Date Value Ref Range  Status  . INR 11/24/2014 1.8   Final  Anti-coag visit on 11/17/2014  Component Date Value Ref Range Status  . INR 11/17/2014 1.9   Final   [facility]  There may be more visits with results that are not included.  Dg Chest 2 View  01/20/2015   CLINICAL DATA:  Shortness of breath, recurrent chest pain for 1 month.  EXAM: CHEST  2 VIEW  COMPARISON:  June 24, 2014  FINDINGS: The heart size and mediastinal contours are within normal limits. There are bilateral increased pulmonary interstitium. No focal pneumonia or pleural effusion is identified. The visualized  skeletal structures are stable. Surgical clips are identified in the epigastric region unchanged.  IMPRESSION: Mild interstitial pulmonary edema.   Electronically Signed   By: Abelardo Diesel M.D.   On: 01/20/2015 18:40   Ct Angio Chest Pe W/cm &/or Wo Cm  01/20/2015   CLINICAL DATA:  Shortness breath and recurrent chest pain 1 month. Bilateral lower extremity swelling 1 week. History of left breast cancer.  EXAM: CT ANGIOGRAPHY CHEST WITH CONTRAST  TECHNIQUE: Multidetector CT imaging of the chest was performed using the standard protocol during bolus administration of intravenous contrast. Multiplanar CT image reconstructions and MIPs were obtained to evaluate the vascular anatomy.  CONTRAST:  150m OMNIPAQUE IOHEXOL 350 MG/ML SOLN  COMPARISON:  Chest CT 03/10/2009 and abdominal pelvic CT 04/21/2014  FINDINGS: Lungs are clear.  Airways are within normal  Heart size is normal. There is moderate three-vessel atherosclerotic coronary artery disease. Images somewhat degraded due to prominent overlying soft tissues. Pulmonary arterial system is otherwise within normal without evidence of emboli. No evidence of hilar, mediastinal or axillary adenopathy. Small hiatal hernia. Remaining mediastinal structures are within normal. Postsurgical changes over the left breast.  Images over the upper abdomen demonstrate surgical clips in the region of the cast option of junction. There is a 4.1 cm simple cyst over the upper pole of the right kidney unchanged. There are degenerative changes of the spine.  Review of the MIP images confirms the above findings.  IMPRESSION: No evidence of pulmonary embolism. No acute cardiopulmonary disease.  Three vessel atherosclerotic coronary artery disease.  4.1 cm simple right renal cyst unchanged.  Postsurgical changes as described.   Electronically Signed   By: DMarin OlpM.D.   On: 01/20/2015 21:50   Dg Bone Density  02/04/2015   CLINICAL DATA:  Postmenopausal. Patient's history of  breast cancer, taking Aromatase. Baseline exam. Technically challenging exam due to patient body habitus. Patient 355 lb. Unable to be position for for arm imaging due to body habitus.  EXAM: DUAL X-RAY ABSORPTIOMETRY (DXA) FOR BONE MINERAL DENSITY  FINDINGS: AP LUMBAR SPINE L1 through L4  Bone Mineral Density (BMD):  1.465 g/cm2  Young Adult T-Score:  2.2  Z-Score:  3.3  RIGHT FEMUR NECK  Bone Mineral Density (BMD):  0.741 g/cm2  Young Adult T-Score: -2.1  Z-Score:  -1.0  ASSESSMENT: Patient's diagnostic category is LOW BONE MASS by WHO Criteria.  FRACTURE RISK:  MODERATE  FRAX: Based on the [place], the 10 year probability of a major osteoporotic fracture is 5.3%. The 10 year probability of a hip fracture is 1.3%.  COMPARISON: None.  Effective therapies are available in the form of bisphosphonates, selective estrogen receptor modulators, biologic agents, and hormone replacement therapy (for women). All patients should ensure an adequate intake of dietary calcium (1200 mg daily) and vitamin D (800 IU daily) unless contraindicated.  All  treatment decisions require clinical judgment and consideration of individual patient factors, including patient preferences, co-morbidities, previous drug use, risk factors not captured in the FRAX model (e.g., frailty, falls, vitamin D deficiency, increased bone turnover, interval significant decline in bone density) and possible under- or over-estimation of fracture risk by FRAX.  The National Osteoporosis Foundation recommends that FDA-approved medical therapies be considered in postmenopausal women and men age 42 or older with a:  1. Hip or vertebral (clinical or morphometric) fracture.  2. T-score of -2.5 or lower at the spine or hip.  3. Ten-year fracture probability by FRAX of 3% or greater for hip fracture or 20% or greater for major osteoporotic fracture.  People with diagnosed cases of osteoporosis or at high risk for fracture should have  regular bone mineral density tests. For patients eligible for Medicare, routine testing is allowed once every 2 years. The testing frequency can be increased to one year for patients who have rapidly progressing disease, those who are receiving or discontinuing medical therapy to restore bone mass, or have additional risk factors.  [facility] Pharmac[facility]) Criteria:  Normal: T-scores from +1.0 to -1.0  Low Bone Mass (Osteopenia): T-scores between -1.0 and -2.5  Osteoporosis: T-scores -2.5 and below  Comparison to Reference Population:  T-score is the key measure used in the diagnosis of osteoporosis and relative risk determination for fracture. It provides a value for bone mass relative to the mean bone mass of a young adult reference population expressed in terms of standard deviation (SD).  Z-score is the age-matched score showing the patient's values compared to a population matched for age, sex, and race. This is also expressed in terms of standard deviation. The patient may have values that compare favorably to the age-matched values and still be at increased risk for fracture.   Electronically Signed   By: Curlene Dolphin M.D.   On: 02/04/2015 15:05     Assessment/Plan   ICD-9-CM ICD-10-CM   1. UTI (lower urinary tract infection) 599.0 N39.0 POC Urinalysis Dipstick     Culture, Urine  2. DM (diabetes mellitus), type 2, uncontrolled, with renal complications - failing to change as expected 250.42 Y80.16 Basic Metabolic Panel    P53.74 ALT     Hemoglobin A1c     Lipid Panel  3. Chronic pain syndrome - stable 338.4 G89.4   4. Essential hypertension - stable 401.9 I10   5. Long term current use of anticoagulant therapy - INR therapeutic V58.61 Z79.01   6. Dyslipidemia associated with type 2 diabetes mellitus - stable 250.80 E11.69 Lipid Panel   272.4 E78.5     Increase restoril 45 mg at bedtime to help sleep  Recommend eat 3 meals per day so blood sugars will not fluctuate as  much.  Increase Toujeo 55 units twice daily  Send urine c&s  Continue other medications as ordered  Follow up in 2 mos for routine visit  LAB APPT: 1 month for fasting labs  [place] S. Perlie Gold  [facility] and Adult Medicine [address] [place], [place] [address] [phone] Cell (Monday-Friday 8 AM - 5 PM) [phone] After 5 PM and follow prompts

## 2015-02-06 DIAGNOSIS — N1832 Chronic kidney disease, stage 3b: Secondary | ICD-10-CM | POA: Insufficient documentation

## 2015-02-06 DIAGNOSIS — E1165 Type 2 diabetes mellitus with hyperglycemia: Secondary | ICD-10-CM

## 2015-02-06 DIAGNOSIS — G894 Chronic pain syndrome: Secondary | ICD-10-CM | POA: Insufficient documentation

## 2015-02-06 DIAGNOSIS — Z794 Long term (current) use of insulin: Secondary | ICD-10-CM

## 2015-02-06 DIAGNOSIS — E1169 Type 2 diabetes mellitus with other specified complication: Secondary | ICD-10-CM | POA: Insufficient documentation

## 2015-02-06 DIAGNOSIS — E1121 Type 2 diabetes mellitus with diabetic nephropathy: Secondary | ICD-10-CM | POA: Insufficient documentation

## 2015-02-06 DIAGNOSIS — E1122 Type 2 diabetes mellitus with diabetic chronic kidney disease: Secondary | ICD-10-CM | POA: Insufficient documentation

## 2015-02-06 DIAGNOSIS — E785 Hyperlipidemia, unspecified: Secondary | ICD-10-CM

## 2015-02-06 HISTORY — DX: Chronic pain syndrome: G89.4

## 2015-02-08 LAB — URINE CULTURE

## 2015-02-10 ENCOUNTER — Other Ambulatory Visit: Payer: Self-pay | Admitting: Internal Medicine

## 2015-02-12 ENCOUNTER — Telehealth: Payer: Self-pay | Admitting: *Deleted

## 2015-02-12 NOTE — Telephone Encounter (Signed)
Patient called and requested Toujeo samples. Samples given.

## 2015-02-17 ENCOUNTER — Other Ambulatory Visit: Payer: Self-pay | Admitting: Internal Medicine

## 2015-02-18 ENCOUNTER — Other Ambulatory Visit: Payer: Self-pay | Admitting: *Deleted

## 2015-02-18 DIAGNOSIS — G894 Chronic pain syndrome: Secondary | ICD-10-CM

## 2015-02-18 MED ORDER — OXYCODONE-ACETAMINOPHEN 10-325 MG PO TABS: ORAL_TABLET | ORAL | Status: DC

## 2015-02-18 NOTE — Telephone Encounter (Signed)
Patient requested and will pick up 

## 2015-02-22 ENCOUNTER — Encounter: Payer: Self-pay | Admitting: Pharmacotherapy

## 2015-02-22 ENCOUNTER — Ambulatory Visit (INDEPENDENT_AMBULATORY_CARE_PROVIDER_SITE_OTHER): Payer: Medicare PPO | Admitting: Pharmacotherapy

## 2015-02-22 VITALS — BP 142/82 | HR 83 | Temp 97.8°F | Resp 20 | Ht 67.0 in | Wt 352.6 lb

## 2015-02-22 DIAGNOSIS — Z7901 Long term (current) use of anticoagulants: Secondary | ICD-10-CM

## 2015-02-22 LAB — POCT INR: INR: 2.2

## 2015-02-22 NOTE — Patient Instructions (Addendum)
INR 2.2 Continue Coumadin 7.5mg  (1 & 1/2 tablets) daily except 5mg  (1 tablet) on mondays, Wednesdays, Fridays  Go to [facility] and get Relion R - inject 35 units with each meal

## 2015-02-22 NOTE — Progress Notes (Signed)
   Subjective:    Patient ID: [name], female    DOB: [birth year], 80 y.o.   MRN: [id number]  HPI Last INR on 01/18/15 was high at 3.8 Coumadin was decreased to 7.5mg  QD except 5mg  MWF Denies missed doses. Denies unusual bleeding or bruising Denies CP, falls Consistent with vitamin K   BG in the 300's Has not been taking Humalog - can't afford.  Review of Systems  HENT: Negative for nosebleeds.   Respiratory: Negative for shortness of breath.   Cardiovascular: Negative for chest pain and palpitations.  Genitourinary: Negative for hematuria.  Hematological: Does not bruise/bleed easily.       Objective:   Physical Exam  Constitutional: She is oriented to person, place, and time. She appears well-developed and well-nourished.  HENT:  Right Ear: External ear normal.  Left Ear: External ear normal.  Cardiovascular: Normal rate, regular rhythm and normal heart sounds.   Pulmonary/Chest: Effort normal and breath sounds normal.  Neurological: She is alert and oriented to person, place, and time. She has normal reflexes.  Skin: Skin is warm and dry.  Psychiatric: She has a normal mood and affect. Her behavior is normal. Judgment and thought content normal.    INR 2.2 BP:  142/82  HR:  83  Wt: 352lb       Assessment & Plan:  1.  INR at goal 2-3 2.  Continue Coumadin 7.5mg  QD except 5mg  MWF 3.  Go to [facility] and get Relion R for Humalog 4.  RTC in 1 month

## 2015-03-17 ENCOUNTER — Telehealth: Payer: Self-pay

## 2015-03-17 NOTE — Telephone Encounter (Signed)
She has a hx PE and really needs a lovenox bridge if she is to stop her coumadin. Please make Tivis Ringer aware so this can be set up. Recommend stopping coumadin 7 days prior to extraction

## 2015-03-17 NOTE — Telephone Encounter (Signed)
Patient's daughter was calling on patient's behalf requesting instructions for pending tooth extraction. Patient will see dentist today and get date of extraction. Patient's daughter would like to get instructions on when patient should stop and restart coumadin prior to tooth extraction.

## 2015-03-17 NOTE — Telephone Encounter (Signed)
Message forwarded to [employer]

## 2015-03-19 ENCOUNTER — Other Ambulatory Visit: Payer: Self-pay | Admitting: *Deleted

## 2015-03-19 ENCOUNTER — Encounter: Payer: Self-pay | Admitting: *Deleted

## 2015-03-19 DIAGNOSIS — G894 Chronic pain syndrome: Secondary | ICD-10-CM

## 2015-03-19 MED ORDER — OXYCODONE-ACETAMINOPHEN 10-325 MG PO TABS: ORAL_TABLET | ORAL | Status: DC

## 2015-03-19 NOTE — Addendum Note (Signed)
Addended by: Rafael Bihari A on: 03/19/2015 08:44 AM   Modules accepted: Orders

## 2015-03-19 NOTE — Telephone Encounter (Signed)
Patient requested and will pick up. Narcotic contract printed.

## 2015-03-20 ENCOUNTER — Other Ambulatory Visit: Payer: Self-pay | Admitting: Internal Medicine

## 2015-03-22 MED ORDER — ENOXAPARIN SODIUM 150 MG/ML ~~LOC~~ SOLN: 150.0000 mg | Freq: Two times a day (BID) | SUBCUTANEOUS | Status: DC

## 2015-03-22 NOTE — Telephone Encounter (Signed)
She needs to be on a lovenox bridge due to breast cancer which increases risk of blood clots, current tx of PE and sedentary lifestyle which increases risk of clots.

## 2015-03-22 NOTE — Telephone Encounter (Signed)
Patient states she had a tooth pulled before and did not have to have this lovenox bridge process completed.   Patient would like for me to double check this with Dr.Carter first then speak with Dr.Cathy (dentist) [phone] or Dr.Brown (oral surgeon) [phone] about this process. Patient was not thrilled with the idea of the Lovenox bridge and would like to understand why she has to have it this time and did not the last time.  Dr.Carter please advise based on conversation with patient. Forward response to triage medical assistant Rodena Piety May) for the day (I will be out of office). Thanks

## 2015-03-22 NOTE — Addendum Note (Signed)
Addended by: Logan Bores on: 03/22/2015 12:05 PM   Modules accepted: Orders, Medications

## 2015-03-22 NOTE — Telephone Encounter (Signed)
Patient and daughter, [name] notified and agreed. Daughter will pick up phone message for medication instructions in writing. Called Dr. Kerry Kass office (oral surgeon)#[phone] and notified [name] of instructions so she can set up appointment for patient.  Message printed and left up front for pick up. Lovenox was faxed to pharmacy by Dr. Eulas Post and receipt of confirmation confirmed.

## 2015-03-22 NOTE — Telephone Encounter (Signed)
OK.  Find out when her extraction will be.  Will call in Lovenox 150mg  every 12 hours # 28 syringes. Stop warfarin 7 days prior.  Start Lovenox 150mg  every 12 hours after stopping warfarin.  No Lovenox morning of extraction.  Restart both Lovenox and warfarin evening of extraction.  Overlap for at least 5 days.

## 2015-03-22 NOTE — Addendum Note (Signed)
Addended by: Gildardo Cranker on: 03/22/2015 03:17 PM   Modules accepted: Orders

## 2015-03-29 ENCOUNTER — Ambulatory Visit (INDEPENDENT_AMBULATORY_CARE_PROVIDER_SITE_OTHER): Payer: Medicare PPO | Admitting: Pharmacotherapy

## 2015-03-29 ENCOUNTER — Encounter: Payer: Self-pay | Admitting: Pharmacotherapy

## 2015-03-29 VITALS — BP 140/72 | HR 87 | Temp 97.4°F | Resp 20 | Wt 350.6 lb

## 2015-03-29 DIAGNOSIS — Z7901 Long term (current) use of anticoagulants: Secondary | ICD-10-CM | POA: Diagnosis not present

## 2015-03-29 DIAGNOSIS — I2782 Chronic pulmonary embolism: Secondary | ICD-10-CM

## 2015-03-29 LAB — POCT INR: INR: 2.9

## 2015-03-29 MED ORDER — WARFARIN SODIUM 5 MG PO TABS: ORAL_TABLET | ORAL | Status: DC

## 2015-03-29 NOTE — Progress Notes (Signed)
   Subjective:    Patient ID: [name], female    DOB: Nov 07, 1934, 80 y.o.   MRN: [id number]  HPI Last INR on 02/22/15 was OK at 2.2 Current Coumadin dose is 5mg  QD except 7.5mg  MWF Denies missed doses. Denies unusual bleeding or bruising. Denies CP Did have 1 fall - no injury Consistent with vitamin K  Will be bridged with Lovenox 11/30 - 12/4 while off Coumadin for dental extractions.   Review of Systems  HENT: Negative for nosebleeds.   Respiratory: Negative for shortness of breath.   Cardiovascular: Positive for leg swelling. Negative for chest pain.  Genitourinary: Negative for hematuria.  Hematological: Does not bruise/bleed easily.       Objective:   Physical Exam  Constitutional: She is oriented to person, place, and time. She appears well-developed and well-nourished.  HENT:  Right Ear: External ear normal.  Left Ear: External ear normal.  Cardiovascular: Normal rate, regular rhythm and normal heart sounds.   Pulmonary/Chest: Effort normal and breath sounds normal.  Neurological: She is alert and oriented to person, place, and time.  Skin: Skin is warm and dry.  Psychiatric: She has a normal mood and affect. Her behavior is normal. Judgment and thought content normal.    BP:  140/72  HR:  87  Wt:  350lb INR 2.9      Assessment & Plan:  1.  INR at goal 2-3 2.  Continue Coumadin 5mg  QD except 7.5mg  MWF 3.  INR 1 month

## 2015-03-29 NOTE — Patient Instructions (Addendum)
INR 2.9 Continue same dose Coumadin 5mg  daily except 7.5mg  on Mondays, Wednesdays, and Fridays

## 2015-04-14 ENCOUNTER — Other Ambulatory Visit: Payer: Self-pay | Admitting: Internal Medicine

## 2015-04-19 ENCOUNTER — Other Ambulatory Visit: Payer: Medicare PPO

## 2015-04-20 ENCOUNTER — Other Ambulatory Visit: Payer: Self-pay

## 2015-04-20 ENCOUNTER — Other Ambulatory Visit: Payer: Medicare PPO

## 2015-04-20 DIAGNOSIS — E1165 Type 2 diabetes mellitus with hyperglycemia: Principal | ICD-10-CM

## 2015-04-20 DIAGNOSIS — E1121 Type 2 diabetes mellitus with diabetic nephropathy: Secondary | ICD-10-CM

## 2015-04-20 DIAGNOSIS — E785 Hyperlipidemia, unspecified: Secondary | ICD-10-CM

## 2015-04-20 DIAGNOSIS — IMO0002 Reserved for concepts with insufficient information to code with codable children: Secondary | ICD-10-CM

## 2015-04-20 DIAGNOSIS — G894 Chronic pain syndrome: Secondary | ICD-10-CM

## 2015-04-20 DIAGNOSIS — Z794 Long term (current) use of insulin: Secondary | ICD-10-CM

## 2015-04-20 MED ORDER — OXYCODONE-ACETAMINOPHEN 10-325 MG PO TABS: ORAL_TABLET | ORAL | Status: DC

## 2015-04-21 ENCOUNTER — Encounter: Payer: Self-pay | Admitting: Internal Medicine

## 2015-04-21 ENCOUNTER — Ambulatory Visit (INDEPENDENT_AMBULATORY_CARE_PROVIDER_SITE_OTHER): Payer: Medicare PPO | Admitting: Internal Medicine

## 2015-04-21 VITALS — BP 130/88 | HR 98 | Temp 98.8°F | Ht 67.0 in | Wt 348.0 lb

## 2015-04-21 DIAGNOSIS — Z7901 Long term (current) use of anticoagulants: Secondary | ICD-10-CM

## 2015-04-21 DIAGNOSIS — F332 Major depressive disorder, recurrent severe without psychotic features: Secondary | ICD-10-CM

## 2015-04-21 LAB — BASIC METABOLIC PANEL
BUN/Creatinine Ratio: 11 (ref 11–26)
BUN: 12 mg/dL (ref 8–27)
CO2: 23 mmol/L (ref 18–29)
Calcium: 8.9 mg/dL (ref 8.7–10.3)
Chloride: 99 mmol/L (ref 96–106)
Creatinine, Ser: 1.1 mg/dL — ABNORMAL HIGH (ref 0.57–1.00)
GFR calc Af Amer: 56 mL/min/{1.73_m2} — ABNORMAL LOW (ref >59)
GFR calc non Af Amer: 48 mL/min/{1.73_m2} — ABNORMAL LOW (ref >59)
Glucose: 198 mg/dL — ABNORMAL HIGH (ref 65–99)
Potassium: 4.1 mmol/L (ref 3.5–5.2)
Sodium: 141 mmol/L (ref 134–144)

## 2015-04-21 LAB — HEMOGLOBIN A1C
Est. average glucose Bld gHb Est-mCnc: 315 mg/dL
Hgb A1c MFr Bld: 12.6 % — ABNORMAL HIGH (ref 4.8–5.6)

## 2015-04-21 LAB — LIPID PANEL
Chol/HDL Ratio: 3.4 ratio units (ref 0.0–4.4)
Cholesterol, Total: 136 mg/dL (ref 100–199)
HDL: 40 mg/dL (ref >39)
LDL Calculated: 58 mg/dL (ref 0–99)
Triglycerides: 188 mg/dL — ABNORMAL HIGH (ref 0–149)
VLDL Cholesterol Cal: 38 mg/dL (ref 5–40)

## 2015-04-21 LAB — ALT: ALT: 43 IU/L — ABNORMAL HIGH (ref 0–32)

## 2015-04-21 MED ORDER — SERTRALINE HCL 25 MG PO TABS: 25.0000 mg | ORAL_TABLET | Freq: Every day | ORAL | Status: DC

## 2015-04-21 NOTE — Progress Notes (Signed)
Patient ID: [name], female   DOB: 02/23/1935, 80 y.o.   MRN: [id number]    Location:    [name]   Place of Service:  OFFICE   Chief Complaint  Patient presents with  . Acute Visit    Acute concern only- arrived late. Patient is depressed missing her husband. Patient's daughter states patient is lonely.      HPI:  80 yo female seen today for depression. She is missing her deceased spouse who expired 3 yrs ago. they were married > 50 yrs. In previous years she has simply dealt with it and not taken any meds. She reports no SI/HI. She is currently taking cymbalta, klonopin, and restoril which usually helps but now with breakthrough sx's. She has never tried SSRI. She does not want to be alone and these last few yrs have been hard for her  Daughters present. She is a poor historian due to depression. Hx obtained from daughters   Past Medical History  Diagnosis Date  . Hyperlipidemia     takes Pravastatin daily  . Coronary artery disease     a. s/p IMI 2004 tx with BMS to RCA;  b. s/p Promus DES to RCA 2/12 (cath: LM 20-30%, pLAD 20-30%, mLAD 50%, RI 30%, pRCA 60%, mRCA 90% - tx with PCI);  c. myoview 1/07: EF 49%, inf scar, no isch  . Urinary incontinence     takes Linzess daily  . Insomnia   . Osteoarthritis   . Obstructive sleep apnea   . Polymyalgia rheumatica ([place])   . Arthritis   . Pulmonary emboli ([place]) 9/13    felt to need lifelong anticoagulation  . Allergy     takes Mucinex daily as needed  . Gout, unspecified     takes Colchicine daily  . Dysuria   . Pain, chronic   . Osteoarthrosis, unspecified whether generalized or localized, lower leg   . Anemia, unspecified   . Coronary atherosclerosis of native coronary artery   . CHF (congestive heart failure) (HCC)     takes Furosemide daily  . Depression     takes Cymbalta daily  . Anxiety     takes Clonazepam daily as needed  . Hypertension     takes Imdur and Metoprolol daily  . GERD (gastroesophageal reflux  disease)     takes Protonix daily  . Diabetes mellitus     insulin daily  . Type II or unspecified type diabetes mellitus without mention of complication, uncontrolled   . Myocardial infarction ([place]) 2004  . History of blood clots about 56yrs ago    in legs-takes Coumadin   . Shortness of breath dyspnea     with exertion and has Albuterol inhaler prn  . Headache     occasionally  . Vertigo     hx of;was taking Meclizine if needed  . Numbness   . Joint pain   . Joint swelling   . Osteoarthritis     Past Surgical History  Procedure Laterality Date  . Appendectomy    . Cardiac catheterization    . Gastric bypass  1977     reversed in 1979, [facility]  . Blood clots/legs and lungs  2013  . Mi with stent placement  2004  . Left heart catheterization with coronary angiogram N/A 06/29/2014    Procedure: LEFT HEART CATHETERIZATION WITH CORONARY ANGIOGRAM;  Surgeon: Troy Sine, MD;  Location: [facility] CATH LAB;  Service: Cardiovascular;  Laterality: N/A;  . Coronary angioplasty  2  . Eye surgery Bilateral     cataract   . Colonoscopy    . Abdominal hysterectomy      partial  . Breast lumpectomy with radioactive seed localization Left 11/05/2014    Procedure: LEFT BREAST LUMPECTOMY WITH RADIOACTIVE SEED LOCALIZATION;  Surgeon: [name], MD;  Location: [place];  Service: General;  Laterality: Left;  . Excision of skin tag Right 11/05/2014    Procedure: EXCISION OF RIGHT EYELID SKIN TAG;  Surgeon: [name], MD;  Location: [place];  Service: General;  Laterality: Right;    Patient Care Team: Gildardo Cranker, DO as PCP - General (Internal Medicine) Renella Cunas, MD as Consulting Physician (Cardiology) [name], MD as Consulting Physician (Ophthalmology) [name], MD as Consulting Physician (General Surgery)  Social History   Social History  . Marital Status: Widowed    Spouse Name: N/A  . Number of Children: N/A  . Years of Education: N/A   Occupational  History  . retired    Social History Main Topics  . Smoking status: Never Smoker   . Smokeless tobacco: Never Used  . Alcohol Use: No  . Drug Use: No  . Sexual Activity: Not Currently   Other Topics Concern  . Not on file   Social History Narrative     reports that she has never smoked. She has never used smokeless tobacco. She reports that she does not drink alcohol or use illicit drugs.  Allergies  Allergen Reactions  . Sulfonamide Derivatives Swelling    Mouth swelling  . Tramadol Nausea And Vomiting    Medications: Patient's Medications  New Prescriptions   No medications on file  Previous Medications   ALBUTEROL (PROVENTIL HFA;VENTOLIN HFA) 108 (90 BASE) MCG/ACT INHALER    Inhale 1-2 puffs into the lungs every 6 (six) hours as needed for wheezing or shortness of breath.   ALLOPURINOL (ZYLOPRIM) 300 MG TABLET    Take 300 mg by mouth.   ANASTROZOLE (ARIMIDEX) 1 MG TABLET    Take 1 tablet (1 mg total) by mouth daily.   BUDESONIDE-FORMOTEROL (SYMBICORT) 160-4.5 MCG/ACT INHALER    In 2 puffs twice daily to help wheezing   CALCIUM CARBONATE ANTACID (TUMS PO)    Take 3 tablets by mouth daily as needed (acid reflux).   CLONAZEPAM (KLONOPIN) 1 MG TABLET    TAKE TWO TABLETS BY MOUTH AT BEDTIME AS NEEDED FOR SLEEP   COLCRYS 0.6 MG TABLET    TAKE ONE TABLET BY MOUTH ONCE DAILY FOR  GOUT   DIPHENHYDRAMINE (BENADRYL) 25 MG TABLET    Take 50 mg by mouth daily as needed for itching. For itching   DULOXETINE (CYMBALTA) 60 MG CAPSULE    Take 1 capsule by mouth every day for anxiety   ENOXAPARIN (LOVENOX) 150 MG/ML INJECTION    Inject 1 mL (150 mg total) into the skin every 12 (twelve) hours.   ERGOCALCIFEROL (VITAMIN D2) 50000 UNITS CAPSULE    Take 1 capsule (50,000 Units total) by mouth once a week.   FUROSEMIDE (LASIX) 20 MG TABLET    Take 20 mg by mouth as needed.   INSULIN GLARGINE (TOUJEO SOLOSTAR) 300 UNIT/ML SOPN    Inject 45 Units into the skin 2 (two) times daily before a meal.  Titrate up as needed.   ISOSORBIDE MONONITRATE (IMDUR) 60 MG 24 HR TABLET    TAKE ONE TABLET BY MOUTH ONCE DAILY   LANCETS (ONETOUCH ULTRASOFT) LANCETS    Use to test blood  sugar Dx: E11.9   MECLIZINE (ANTIVERT) 25 MG TABLET    Take 1 tablet (25 mg total) by mouth 3 (three) times daily.   METOPROLOL SUCCINATE (TOPROL-XL) 25 MG 24 HR TABLET    Take 1 tablet by mouth once every day for blood pressure   NITROGLYCERIN (NITROSTAT) 0.4 MG SL TABLET    Take one tablet under the tongue every 5 minutes as needed for chest pain   OXYCODONE-ACETAMINOPHEN (PERCOCET) 10-325 MG TABLET    Take one tablet every 6 hours as needed for pain   PANTOPRAZOLE (PROTONIX) 40 MG TABLET    TAKE ONE TABLET BY MOUTH ONCE DAILY   PRAVASTATIN (PRAVACHOL) 20 MG TABLET    Take one tablet by mouth once daily for cholesterol   TEMAZEPAM (RESTORIL) 15 MG CAPSULE    Take 1 capsule (15 mg total) by mouth at bedtime as needed for sleep. Take with 30mg  capsule   TEMAZEPAM (RESTORIL) 30 MG CAPSULE    TAKE ONE CAPSULE BY MOUTH AT BEDTIME AS NEEDED FOR SLEEP   WARFARIN (COUMADIN) 5 MG TABLET    Take 1 tablet daily except 1 & 1/2 tablets on Mondays, Wednesdays and Fridays  Modified Medications   No medications on file  Discontinued Medications   FUROSEMIDE (LASIX) 20 MG TABLET    Take one tablet by mouth twice daily for edema   INSULIN LISPRO, HUMAN, (HUMALOG KWIKPEN) 200 UNIT/ML SOPN    Inject 35 Units into the skin 3 (three) times daily before meals.    Review of Systems  Unable to perform ROS: Psychiatric disorder    Filed Vitals:   04/21/15 1445  BP: 130/88  Pulse: 98  Temp: 98.8 F (37.1 C)  TempSrc: Oral  Height: 5\' 7"  (1.702 m)  Weight: 348 lb (157.852 kg)  SpO2: 96%   Body mass index is 54.49 kg/(m^2).  Physical Exam  Constitutional: She appears well-developed and well-nourished. No distress.  Neurological: She is alert.  Psychiatric: Her speech is normal. Judgment and thought content normal. She is withdrawn.  Cognition and memory are normal. She exhibits a depressed mood.  Flat affect   Depression screen [facility] 2/9 04/21/2015 10/27/2014 10/14/2014 09/23/2014 09/23/2014  Decreased Interest 3 0 0 - 0  Down, Depressed, Hopeless 3 0 3 - 3  PHQ - 2 Score 6 0 3 - 3  Altered sleeping 3 - 3 - 3  Tired, decreased energy 3 - 3 - 3  Change in appetite 3 - 0 - 0  Feeling bad or failure about yourself  2 - 2 - 2  Trouble concentrating 2 - 3 - 3  Moving slowly or fidgety/restless 0 - - - 0  Suicidal thoughts 0 - 1 1 0  PHQ-9 Score 19 - 15 - 14  Difficult doing work/chores Very difficult - Not difficult at all - Not difficult at all        Labs reviewed: Appointment on 04/20/2015  Component Date Value Ref Range Status  . Glucose 04/20/2015 198* 65 - 99 mg/dL Final  . BUN 04/20/2015 12  8 - 27 mg/dL Final  . Creatinine, Ser 04/20/2015 1.10* 0.57 - 1.00 mg/dL Final  . GFR calc non Af Amer 04/20/2015 48* >59 mL/min/1.73 Final  . GFR calc Af Amer 04/20/2015 56* >59 mL/min/1.73 Final  . BUN/Creatinine Ratio 04/20/2015 11  11 - 26 Final  . Sodium 04/20/2015 141  134 - 144 mmol/L Final                 **  Please note reference interval change**  . Potassium 04/20/2015 4.1  3.5 - 5.2 mmol/L Final  . Chloride 04/20/2015 99  96 - 106 mmol/L Final                 **Please note reference interval change**  . CO2 04/20/2015 23  18 - 29 mmol/L Final  . Calcium 04/20/2015 8.9  8.7 - 10.3 mg/dL Final  . ALT 04/20/2015 43* 0 - 32 IU/L Final  . Hgb A1c MFr Bld 04/20/2015 12.6* 4.8 - 5.6 % Final   Comment:          Pre-diabetes: 5.7 - 6.4          Diabetes: >6.4          Glycemic control for adults with diabetes: <7.0   . Est. average glucose Bld gHb Est-m* 04/20/2015 315   Final  . Cholesterol, Total 04/20/2015 136  100 - 199 mg/dL Final  . Triglycerides 04/20/2015 188* 0 - 149 mg/dL Final  . HDL 04/20/2015 40  >39 mg/dL Final  . VLDL Cholesterol Cal 04/20/2015 38  5 - 40 mg/dL Final  . LDL Calculated 04/20/2015 58   0 - 99 mg/dL Final  . Chol/HDL Ratio 04/20/2015 3.4  0.0 - 4.4 ratio units Final   Comment:                                   T. Chol/HDL Ratio                                             Men  Women                               1/2 Avg.Risk  3.4    3.3                                   Avg.Risk  5.0    4.4                                2X Avg.Risk  9.6    7.1                                3X Avg.Risk 23.4   11.0   Office Visit on 03/29/2015  Component Date Value Ref Range Status  . INR 03/29/2015 2.9   Final  Office Visit on 02/22/2015  Component Date Value Ref Range Status  . INR 02/22/2015 2.2   Final  Office Visit on 02/05/2015  Component Date Value Ref Range Status  . Color, UA 02/05/2015 dark yellow   Final  . Clarity, UA 02/05/2015 cloudy   Final  . Glucose, UA 02/05/2015 2000 or more   Final  . Bilirubin, UA 02/05/2015 small   Final  . Ketones, UA 02/05/2015 trace   Final  . Spec Grav, UA 02/05/2015 1.020   Final  . Blood, UA 02/05/2015 hemolyzed trace   Final  . pH, UA 02/05/2015 5.0   Final  . Protein, UA 02/05/2015 30  Final  . Urobilinogen, UA 02/05/2015 negative   Final  . Nitrite, UA 02/05/2015 positive   Final  . Leukocytes, UA 02/05/2015 moderate (2+)* Negative Final  . Urine Culture, Routine 02/05/2015 Final report*  Final  . Urine Culture result 1 02/05/2015 Klebsiella pneumoniae*  Final   Greater than 100,000 colony forming units per mL  . RESULT 2 02/05/2015 Proteus mirabilis*  Final   Greater than 100,000 colony forming units per mL  . ANTIMICROBIAL SUSCEPTIBILITY 02/05/2015 Comment   Final   Comment:       ** S = Susceptible; I = Intermediate; R = Resistant **                    P = Positive; N = Negative             MICS are expressed in micrograms per mL    Antibiotic                 RSLT#1    RSLT#2    RSLT#3    RSLT#4 Amoxicillin/Clavulanic Acid    S         S Ampicillin                     R         S Cefepime                       S          S Ceftriaxone                    S         S Cefuroxime                     S         S Cephalothin                    S         S Ciprofloxacin                  S         S Ertapenem                      S         S Gentamicin                     S         S Imipenem                       S Levofloxacin                   S         S Nitrofurantoin                 S         R Piperacillin                   S         S Tetracycline                   S         R Tobramycin  S         S Trimethoprim/Sulfa             S         S     No results found.   Assessment/Plan   ICD-9-CM ICD-10-CM   1. Severe episode of recurrent major depressive disorder, without psychotic features ([place]) 296.33 F33.2 sertraline (ZOLOFT) 25 MG tablet  2. Long term current use of anticoagulant therapy - on coumadin for PE V58.61 Z79.01     Start zoloft 25mg  at bedtime  Cont other meds as ordered. Closely follow coumadin due to new med. She will f/u with coumadin nurse, Tivis Ringer  Follow up in 1 month to reassess depression  Amarii Amy S. Perlie Gold  [facility] and Adult Medicine [address] [place], [place] [address] [phone] Cell (Monday-Friday 8 AM - 5 PM) [phone] After 5 PM and follow prompts

## 2015-04-21 NOTE — Patient Instructions (Signed)
Start Zoloft (sertraline) at bedtime  Continue other medications as ordered  Follow up in 1 month to assess depression and routine visit

## 2015-04-22 ENCOUNTER — Ambulatory Visit: Payer: Medicare PPO | Admitting: Hematology

## 2015-04-22 ENCOUNTER — Other Ambulatory Visit: Payer: Medicare PPO

## 2015-04-26 ENCOUNTER — Encounter: Payer: Self-pay | Admitting: Hematology

## 2015-04-26 ENCOUNTER — Ambulatory Visit (HOSPITAL_BASED_OUTPATIENT_CLINIC_OR_DEPARTMENT_OTHER): Payer: Medicare PPO | Admitting: Hematology

## 2015-04-26 ENCOUNTER — Other Ambulatory Visit (HOSPITAL_BASED_OUTPATIENT_CLINIC_OR_DEPARTMENT_OTHER): Payer: Medicare PPO

## 2015-04-26 VITALS — BP 178/88 | HR 88 | Temp 97.7°F | Resp 23 | Ht 67.0 in | Wt 351.4 lb

## 2015-04-26 DIAGNOSIS — I2510 Atherosclerotic heart disease of native coronary artery without angina pectoris: Secondary | ICD-10-CM | POA: Diagnosis not present

## 2015-04-26 DIAGNOSIS — C50919 Malignant neoplasm of unspecified site of unspecified female breast: Secondary | ICD-10-CM | POA: Insufficient documentation

## 2015-04-26 DIAGNOSIS — C50912 Malignant neoplasm of unspecified site of left female breast: Secondary | ICD-10-CM | POA: Diagnosis not present

## 2015-04-26 LAB — COMPREHENSIVE METABOLIC PANEL
ALT: 36 U/L (ref 0–55)
AST: 32 U/L (ref 5–34)
Albumin: 3.2 g/dL — ABNORMAL LOW (ref 3.5–5.0)
Alkaline Phosphatase: 80 U/L (ref 40–150)
Anion Gap: 10 mEq/L (ref 3–11)
BUN: 18.4 mg/dL (ref 7.0–26.0)
CO2: 26 mEq/L (ref 22–29)
Calcium: 9.3 mg/dL (ref 8.4–10.4)
Chloride: 99 mEq/L (ref 98–109)
Creatinine: 1.4 mg/dL — ABNORMAL HIGH (ref 0.6–1.1)
EGFR: 41 mL/min/{1.73_m2} — ABNORMAL LOW (ref >90)
Glucose: 405 mg/dl — ABNORMAL HIGH (ref 70–140)
Potassium: 4.5 mEq/L (ref 3.5–5.1)
Sodium: 135 mEq/L — ABNORMAL LOW (ref 136–145)
Total Bilirubin: 0.43 mg/dL (ref 0.20–1.20)
Total Protein: 8.3 g/dL (ref 6.4–8.3)

## 2015-04-26 LAB — CBC & DIFF AND RETIC
BASO%: 0.1 % (ref 0.0–2.0)
Basophils Absolute: 0 10*3/uL (ref 0.0–0.1)
EOS%: 2 % (ref 0.0–7.0)
Eosinophils Absolute: 0.2 10*3/uL (ref 0.0–0.5)
HCT: 33.4 % — ABNORMAL LOW (ref 34.8–46.6)
HGB: 10.4 g/dL — ABNORMAL LOW (ref 11.6–15.9)
Immature Retic Fract: 15.3 % — ABNORMAL HIGH (ref 1.60–10.00)
LYMPH%: 27 % (ref 14.0–49.7)
MCH: 26.7 pg (ref 25.1–34.0)
MCHC: 31.1 g/dL — ABNORMAL LOW (ref 31.5–36.0)
MCV: 85.6 fL (ref 79.5–101.0)
MONO#: 0.6 10*3/uL (ref 0.1–0.9)
MONO%: 6.6 % (ref 0.0–14.0)
NEUT#: 5.5 10*3/uL (ref 1.5–6.5)
NEUT%: 64.3 % (ref 38.4–76.8)
Platelets: 345 10*3/uL (ref 145–400)
RBC: 3.9 10*6/uL (ref 3.70–5.45)
RDW: 14.7 % — ABNORMAL HIGH (ref 11.2–14.5)
Retic %: 2.05 % (ref 0.70–2.10)
Retic Ct Abs: 79.95 10*3/uL (ref 33.70–90.70)
WBC: 8.5 10*3/uL (ref 3.9–10.3)
lymph#: 2.3 10*3/uL (ref 0.9–3.3)

## 2015-04-26 MED ORDER — ANASTROZOLE 1 MG PO TABS: 1.0000 mg | ORAL_TABLET | Freq: Every day | ORAL | Status: DC

## 2015-04-27 ENCOUNTER — Other Ambulatory Visit: Payer: Self-pay | Admitting: Internal Medicine

## 2015-04-28 ENCOUNTER — Other Ambulatory Visit: Payer: Self-pay | Admitting: *Deleted

## 2015-04-28 ENCOUNTER — Telehealth: Payer: Self-pay

## 2015-04-28 MED ORDER — TEMAZEPAM 30 MG PO CAPS: 30.0000 mg | ORAL_CAPSULE | Freq: Every evening | ORAL | Status: DC | PRN

## 2015-04-28 NOTE — Telephone Encounter (Signed)
Escribed from pharmacy

## 2015-04-28 NOTE — Telephone Encounter (Signed)
Paperwork was received from [employer] for ankle/back/and knee brace. I called patient to clarify if she requested these items and why, patient states she did not request these items. Patient states this place keeps calling her and she tells them she does not need their services. I will fax paperwork back informing them patient is not interested.

## 2015-04-29 ENCOUNTER — Telehealth: Payer: Self-pay | Admitting: Hematology

## 2015-04-29 NOTE — Telephone Encounter (Signed)
Spoke with patients daughter and she is aware of her appointments

## 2015-04-30 ENCOUNTER — Other Ambulatory Visit: Payer: Self-pay

## 2015-04-30 ENCOUNTER — Other Ambulatory Visit: Payer: Self-pay | Admitting: Internal Medicine

## 2015-04-30 DIAGNOSIS — E1169 Type 2 diabetes mellitus with other specified complication: Secondary | ICD-10-CM

## 2015-04-30 DIAGNOSIS — E785 Hyperlipidemia, unspecified: Principal | ICD-10-CM

## 2015-04-30 MED ORDER — PRAVASTATIN SODIUM 20 MG PO TABS: ORAL_TABLET | ORAL | Status: DC

## 2015-05-14 NOTE — Progress Notes (Signed)
.    Hematology oncology Clinic FOllowup Note  Date of service .04/26/2015    Patient Care Team: Gildardo Cranker, DO as PCP - General (Internal Medicine) Renella Cunas, MD as Consulting Physician (Cardiology) [name], MD as Consulting Physician (Ophthalmology) [name], MD as Consulting Physician (General Surgery)  CHIEF COMPLAINTS/PURPOSE OF CONSULTATION:  F/u for breast cancer.  DIAGNOSIS: left-sided presumed stage IA  (pT1c,pNx[cN0], cM0) invasive ductal carcinoma grade 2 out of 3, strongly ER +100%, strongly PR +100% and HER-2/neu negative. Given her high risk cardiac status lumpectomy had to be done under local anesthesia and sentinel lymph node biopsy was not possible. She has accompanying DCIS of intermediate grade present at margins. ECOG performance status is 3.  CURRENT TREATMENT: Arimidex 51m po daily  HISTORY OF PRESENTING ILLNESS: -please see my initial consultation for details of intial presentation  INTERVAL HISTORY  [name] here for her scheduled 3 month followup of her breast cancer.  She notes that she has been compliant with her Arimidex and has not had any prohibitive symptoms. Did have some grade 1 hot flashes which have improved since she was recently started on zoloft by her PCP. Has not noted any new breast discomfort/skin changes or obvious lumps. Is due for her MMG in 07/2015. Reiterates again that she would not want any aggressive treatments and does not want to be a burden to hre family. Blood sugar levels not optimally controlled.  MEDICAL HISTORY:  Past Medical History  Diagnosis Date  . Hyperlipidemia     takes Pravastatin daily  . Coronary artery disease     a. s/p IMI 2004 tx with BMS to RCA;  b. s/p Promus DES to RCA 2/12 (cath: LM 20-30%, pLAD 20-30%, mLAD 50%, RI 30%, pRCA 60%, mRCA 90% - tx with PCI);  c. myoview 1/07: EF 49%, inf scar, no isch  . Urinary incontinence     takes Linzess daily  . Insomnia   . Osteoarthritis     . Obstructive sleep apnea   . Pol[place]yalgia rheumatica ([facility]   . Arthritis   . Pulmonary emboli ([facility] 9[place]13    felt to need lifelong anticoagulation  . Allergy     takes Mucinex daily as needed  . Gout, unspecified     takes Colchicine daily  . Dysuria   . Pain, chronic   . Osteoarthrosis, unspecified whether generalized or localized, lower leg   . Anemia, unspecified   . Coronary atherosclerosis of native coronary artery   . CHF (congestive heart failure) (HCC)     takes Furosemide daily  . Depression     takes Cymbalta daily  . Anxiety     takes Clonazepam daily as needed  . Hypertension     takes Imdur and Metoprolol daily  . GERD (gastroesophageal reflux disease)     takes Protonix daily  . Diabetes mellitus     insulin daily  . Type II or unspecified type diabetes mellitus without mention of complication, uncontrolled   . Myocardial infarction ([facility] 2004  . History of blood clots about [place]yrago    in legs-takes Coumadin   . Shortness of breath dyspnea     with exertion and has Albuterol inhaler prn  . Headache     occasionally  . Vertigo     hx of;was taking Meclizine if needed  . Numbness   . Joint pain   . Joint swelling   . Osteoarthritis     SURGICAL HISTORY: Past Surgical  History  Procedure Laterality Date  . Appendectomy    . Cardiac catheterization    . Gastric bypass  1977     reversed in 1979, [facility]  . Blood clots/legs and lungs  2013  . Mi with stent placement  2004  . Left heart catheterization with coronary angiogram N/A 06/29/2014    Procedure: LEFT HEART CATHETERIZATION WITH CORONARY ANGIOGRAM;  Surgeon: Troy Sine, MD;  Location: [facility] CATH LAB;  Service: Cardiovascular;  Laterality: N/A;  . Coronary angioplasty  2  . Eye surgery Bilateral     cataract   . Colonoscopy    . Abdominal hysterectomy      partial  . Breast lumpectomy with radioactive seed localization Left 11/05/2014    Procedure: LEFT BREAST LUMPECTOMY WITH RADIOACTIVE  SEED LOCALIZATION;  Surgeon: [name], MD;  Location: [place];  Service: General;  Laterality: Left;  . Excision of skin tag Right 11/05/2014    Procedure: EXCISION OF RIGHT EYELID SKIN TAG;  Surgeon: [name], MD;  Location: [place];  Service: General;  Laterality: Right;    SOCIAL HISTORY: Social History   Social History  . Marital Status: Widowed    Spouse Name: N/A  . Number of Children: N/A  . Years of Education: N/A   Occupational History  . retired    Social History Main Topics  . Smoking status: Never Smoker   . Smokeless tobacco: Never Used  . Alcohol Use: No  . Drug Use: No  . Sexual Activity: Not Currently   Other Topics Concern  . Not on file   Social History Narrative    FAMILY HISTORY: Family History  Problem Relation Age of Onset  . Breast cancer Mother 22  . Heart disease Mother   . Throat cancer Father   . Hypertension Father   . Arthritis Father   . Diabetes Father   . Arthritis Sister   . Obesity Sister   . Diabetes Sister     ALLERGIES:  is allergic to sulfonamide derivatives and tramadol.  MEDICATIONS:  Current Outpatient Prescriptions  Medication Sig Dispense Refill  . albuterol (PROVENTIL HFA;VENTOLIN HFA) 108 (90 BASE) MCG/ACT inhaler Inhale 1-2 puffs into the lungs every 6 (six) hours as needed for wheezing or shortness of breath. 1 Inhaler 0  . allopurinol (ZYLOPRIM) 300 MG tablet Take 300 mg by mouth.    [name] anastrozole (ARIMIDEX) 1 MG tablet Take 1 tablet (1 mg total) by mouth daily. 90 tablet 4  . budesonide-formoterol (SYMBICORT) 160-4.5 MCG/ACT inhaler In 2 puffs twice daily to help wheezing (Patient not taking: Reported on 04/21/2015) 1 Inhaler 3  . Calcium Carbonate Antacid (TUMS PO) Take 3 tablets by mouth daily as needed (acid reflux).    . clonazePAM (KLONOPIN) 1 MG tablet TAKE TWO TABLETS BY MOUTH AT BEDTIME AS NEEDED FOR SLEEP 60 tablet 0  . COLCRYS 0.6 MG tablet TAKE ONE TABLET BY MOUTH ONCE DAILY FOR  GOUT 30  tablet 2  . diphenhydrAMINE (BENADRYL) 25 MG tablet Take 50 mg by mouth daily as needed for itching. For itching    . DULoxetine (CYMBALTA) 60 MG capsule Take 1 capsule by mouth every day for anxiety 90 capsule 1  . enoxaparin (LOVENOX) 150 MG/ML injection Inject 1 mL (150 mg total) into the skin every 12 (twelve) hours. 28 Syringe 0  . ergocalciferol (VITAMIN D2) 50000 UNITS capsule Take 1 capsule (50,000 Units total) by mouth once a week. 8 capsule 0  . furosemide (LASIX)  20 MG tablet Take 20 mg by mouth as needed.    . Insulin Glargine (TOUJEO SOLOSTAR) 300 UNIT/ML SOPN Inject 45 Units into the skin 2 (two) times daily before a meal. Titrate up as needed. (Patient taking differently: Inject 60 Units into the skin 2 (two) times daily before a meal. Titrate up as needed.) 10 pen 11  . isosorbide mononitrate (IMDUR) 60 MG 24 hr tablet TAKE ONE TABLET BY MOUTH ONCE DAILY 90 tablet 1  . Lancets (ONETOUCH ULTRASOFT) lancets Use to test blood sugar Dx: E11.9 100 each 12  . meclizine (ANTIVERT) 25 MG tablet Take 1 tablet (25 mg total) by mouth 3 (three) times daily. (Patient taking differently: Take 25 mg by mouth 2 (two) times daily. ) 90 tablet 0  . metoprolol succinate (TOPROL-XL) 25 MG 24 hr tablet Take 1 tablet by mouth once every day for blood pressure 90 tablet 1  . nitroGLYCERIN (NITROSTAT) 0.4 MG SL tablet Take one tablet under the tongue every 5 minutes as needed for chest pain (Patient taking differently: Place 0.4 mg under the tongue every 5 (five) minutes as needed for chest pain. ) 50 tablet 0  . oxyCODONE-acetaminophen (PERCOCET) 10-325 MG tablet Take one tablet every 6 hours as needed for pain 120 tablet 0  . pantoprazole (PROTONIX) 40 MG tablet TAKE ONE TABLET BY MOUTH ONCE DAILY 90 tablet 0  . pravastatin (PRAVACHOL) 20 MG tablet Take one tablet by mouth once daily for cholesterol 90 tablet 1  . sertraline (ZOLOFT) 25 MG tablet Take 1 tablet (25 mg total) by mouth at bedtime. 30 tablet 6    . temazepam (RESTORIL) 15 MG capsule Take 1 capsule (15 mg total) by mouth at bedtime as needed for sleep. Take with 48m capsule 30 capsule 3  . temazepam (RESTORIL) 30 MG capsule Take 1 capsule (30 mg total) by mouth at bedtime as needed. for sleep 30 capsule 0  . warfarin (COUMADIN) 5 MG tablet Take 1 tablet daily except 1 & 1/2 tablets on Mondays, Wednesdays and Fridays 45 tablet 4   No current facility-administered medications for this visit.    REVIEW OF SYSTEMS:   Review of systems as noted above Remaining 10 point review of systems negative except as noted above.  PHYSICAL EXAMINATION: ECOG PERFORMANCE STATUS: 3 - Symptomatic, >50% confined to bed  Filed Vitals:   04/26/15 1432  BP: 178/88  Pulse: 88  Temp: 97.7 F (36.5 C)  Resp: 23   Filed Weights   04/26/15 1432  Weight: 351 lb 6.4 oz (159.394 kg)   GENERAL: Pleasant African-American lady, alert, no distress and comfortable, obese. SKIN: skin color, texture, turgor are normal, no rashes or significant lesions EYES: normal, conjunctiva are pink and non-injected, sclera clear OROPHARYNX: no exudate, no erythema and lips, buccal mucosa, and tongue normal  NECK: supple, thyroid normal size, non-tender, without nodularity LYMPH:  no palpable lymphadenopathy in the cervical, axillary or inguinal LUNGS: clear to auscultation normal breathing effort HEART: regular rate & rhythm and no murmurs and no lower extremity edema ABDOMEN:abdomen obese, soft, non-tender and normal bowel sounds Musculoskeletal:no cyanosis of digits and no clubbing  PSYCH: alert & oriented x 3 with fluent speech NEURO: no focal motor/sensory deficits  LABORATORY DATA:  I have reviewed the data as listed  . CBC Latest Ref Rng 04/26/2015 01/20/2015 01/20/2015  WBC 3.9 - 10.3 10e3/uL 8.5 9.0 6.3  Hemoglobin 11.6 - 15.9 g/dL 10.4(L) 10.6(L) 10.2(L)  Hematocrit 34.8 - 46.6 % 33.4(L) 34.1(L)  32.7(L)  Platelets 145 - 400 10e3/uL 345 292 282     Recent Labs  12/03/14 1149  01/20/15 1400 01/20/15 1749 04/20/15 1001 04/26/15 1404  NA 140  < > 133* 131* 141 135*  K 4.3  < > 5.0 5.0 4.1 4.5  CL 97  --   --  95* 99  --   CO2 21  < > _0 GLUCOSE 173*  < > 529* 490* 198* 405*  BUN 26  < > 23.5 24* 12 18.4  CREATININE 1.43*  < > 1.6* 1.36* 1.10* 1.4*  CALCIUM 8.4*  < > 9.1 8.9 8.9 9.3  GFRNONAA 35*  --   --  36* 48*  --   GFRAA 40*  --   --  42* 56*  --   PROT 7.3  < > 7.8 8.0  --  8.3  ALBUMIN 3.5  < > 3.1* 3.4*  --  3.2*  AST 21  < > 27 38  --  32  ALT 20  < > 29 34 43* 36  ALKPHOS 88  < > 95 82  --  80  BILITOT <0.2  < > 0.53 0.7  --  0.43  BILIDIR  --   --   --  <0.1*  --   --   IBILI  --   --   --  NOT CALCULATED  --   --   < > = values in this interval not displayed.  RADIOGRAPHIC STUDIES: I have personally reviewed the radiological images as listed and agreed with the findings in the report.  Mammogram digital diagnostic bilateral 03/24/2014: IMPRESSION: Suspicious intraductal left breast masses, with associated left breast calcifications.  RECOMMENDATION: Ultrasound-guided biopsy of the intraductal masses in the left breast is recommended. This is scheduled for 04/07/2014 at 3 p.m.  I have discussed the findings and recommendations with the patient. Results were also provided in writing at the conclusion of the visit. If applicable, a reminder letter will be sent to the patient regarding the next appointment.  BI-RADS CATEGORY 4: Suspicious.     DUAL X-RAY ABSORPTIOMETRY (DXA) FOR BONE MINERAL DENSITY (02/04/2015)  FINDINGS: AP LUMBAR SPINE L1 through L4  Bone Mineral Density (BMD): 1.465 g/cm2  Young Adult T-Score: 2.2  Z-Score: 3.3  RIGHT FEMUR NECK  Bone Mineral Density (BMD): 0.741 g/cm2  Young Adult T-Score: -2.1  Z-Score: -1.0  ASSESSMENT: Patient's diagnostic category is LOW BONE MASS by WHO Criteria.  FRACTURE RISK: MODERATE   ASSESSMENT &  PLAN:   Mrs. Postlethwait is a very wonderful 81 year old African-American female with multiple medical comorbidities as described above with   #1Left-sided presumed stage IA  (pT1c,pNx[cN0], cM0) invasive ductal carcinoma grade 2 out of 3, strongly ER +100%, strongly PR +100% and HER-2/neu negative. Given her high risk cardiac status lumpectomy had to be done under local anesthesia and sentinel lymph node biopsy was not possible. She has accompanying DCIS of intermediate grade present at margins. ECOG performance status is 3. Dexa scan "low bone mass" per WHO.  #2 significant coronary artery disease with stress test in February 2016 suggesting likely multivessel disease. Has uncontrolled diabetes, hypertension, dyslipidemia, untreated sleep apnea, coronary disease, severe arthritis all of which are significantly limiting her quality of life. #3 vitamin D deficiency - 25OH vitamin D level of 10, s/p high dose ergocalciferol re-placement. Now on vit D 2000 units daily. Will recehck on next visit.  Plan -no clear clinical indication for breast cancer  recurrence/progression at this time. -continue Arimidex 61m po daily -on zoloft as per PCP which appears to be helping her hot flashes as well -continue vit D 2000IU daily - recheck on next visit. S/p erogocalciferol. -atleast 1200-15068mpo calcium intake daily -MMG/tomosynthesis prior to f/u in 07/2015. -continue f/u with PCP to monitor and management other multiple signiifcant medical co-morbids.  -patient was referred to the emergency department for evaluation and management for severe hyperglycemia and to evaluate her cardiac status given her concerning symptoms. -She appears to be tolerating her Arimidex well.  We'll continue Arimidex 1 mg by mouth daily. -DEXA scan was ordered for bone density testing -which is currently pending.encouraged patient to go ahead and schedule this. -cont on high-dose vitamin D to treat her vitamin D deficiency:  Ergocalciferol 50,000 units weekly 8 doses followed by vitamin D 2000 international units daily along with calcium 1000 mg daily. -rpt DEXA scan in 01/2017 for bone health monitoirng on Arimidex.  #4 postmenopausal vaginal bleeding -patient notes that she is following up with GYN for this and was told that she has an endometrial polyp. -management per Gyn  Return to care with Dr. KaIrene Limbon 3 months with CBC, CMP , 25OH vit D and MMG/.tomosynthesis.  Continue follow-up with your primary care physician Dr. CaEulas Postor other ongoing cares.  I appreciate the privilege of taking care of this wonderful person.  GaSullivan LoneD MSOberonematology/Oncology Physician [facility](Office):       [phone] cell):  [phone]):           [phone]

## 2015-05-17 ENCOUNTER — Ambulatory Visit: Payer: Medicare PPO | Admitting: Pharmacotherapy

## 2015-05-20 ENCOUNTER — Other Ambulatory Visit: Payer: Self-pay | Admitting: *Deleted

## 2015-05-20 DIAGNOSIS — G894 Chronic pain syndrome: Secondary | ICD-10-CM

## 2015-05-20 MED ORDER — OXYCODONE-ACETAMINOPHEN 10-325 MG PO TABS: ORAL_TABLET | ORAL | Status: DC

## 2015-05-25 ENCOUNTER — Telehealth: Payer: Self-pay

## 2015-05-25 DIAGNOSIS — G4733 Obstructive sleep apnea (adult) (pediatric): Secondary | ICD-10-CM | POA: Diagnosis not present

## 2015-05-25 NOTE — Telephone Encounter (Signed)
Letter received from [facility] stating YOUR DRUG(S) IS NOT ON OUR LIST OF COVERED DRUGS (FORMULARY) OR IS SUBJECT TO CERTAIN LIMITS. Prescriber to contact [phone] for coverage determintation.  I called to initiate PA. The insurance shows that claim for Colcrys was processed and paid to the pharmacy and patient no longer needs PA. Medication was reversed to the generic and covered. No further action needed.

## 2015-05-28 ENCOUNTER — Ambulatory Visit: Payer: Medicare PPO | Admitting: Internal Medicine

## 2015-06-01 ENCOUNTER — Other Ambulatory Visit: Payer: PPO

## 2015-06-01 ENCOUNTER — Telehealth: Payer: Self-pay

## 2015-06-01 ENCOUNTER — Other Ambulatory Visit: Payer: Self-pay | Admitting: *Deleted

## 2015-06-01 DIAGNOSIS — H35373 Puckering of macula, bilateral: Secondary | ICD-10-CM | POA: Diagnosis not present

## 2015-06-01 DIAGNOSIS — Z961 Presence of intraocular lens: Secondary | ICD-10-CM | POA: Diagnosis not present

## 2015-06-01 DIAGNOSIS — C50912 Malignant neoplasm of unspecified site of left female breast: Secondary | ICD-10-CM

## 2015-06-01 DIAGNOSIS — R8290 Unspecified abnormal findings in urine: Secondary | ICD-10-CM

## 2015-06-01 DIAGNOSIS — H26491 Other secondary cataract, right eye: Secondary | ICD-10-CM | POA: Diagnosis not present

## 2015-06-01 DIAGNOSIS — E113393 Type 2 diabetes mellitus with moderate nonproliferative diabetic retinopathy without macular edema, bilateral: Secondary | ICD-10-CM | POA: Diagnosis not present

## 2015-06-01 NOTE — Telephone Encounter (Signed)
After looking in her chart again, we noticed that she had an abnormal urine culture in Sept. We could not find where there were any new tests requested but we performed another urine culture on the specimen that she brought in.   Thanks,  Karna Christmas

## 2015-06-01 NOTE — Telephone Encounter (Signed)
-----   Message from [place], [place] sent at 06/01/2015  2:15 PM EST ----- Regarding: RE: Urine specimen brought into office by patient Lind Covert,  Not sure why she brought a sample to the office. I reviewed her chart and could not find where I may have ordered a UA or cx and she was not able to provide a sample while in the office. Did we ask the pt why she was bringing a sample? Is she having urinary symptoms? She does have a hx recurrent UTIs.  Dr C  ----- Message -----    From: Denyse Amass, CMA    Sent: 06/01/2015  10:27 AM      To: Gildardo Cranker, DO Subject: Urine specimen brought into office by patient  Dr. Eulas Post,   Mrs Muzzy brought a urine specimen into the office today but I cannot find any orders for testing. Could you please advise as to which tests need to be ran.   Thank you,  Coralyn Mark

## 2015-06-01 NOTE — Telephone Encounter (Signed)
Noted but she has no clinical reason to have another test performed if she does not have symptoms. What are her sx's so that it may properly be documented?

## 2015-06-02 ENCOUNTER — Encounter: Payer: Self-pay | Admitting: Internal Medicine

## 2015-06-02 ENCOUNTER — Other Ambulatory Visit: Payer: Self-pay | Admitting: *Deleted

## 2015-06-02 ENCOUNTER — Ambulatory Visit (INDEPENDENT_AMBULATORY_CARE_PROVIDER_SITE_OTHER): Payer: PPO | Admitting: Internal Medicine

## 2015-06-02 VITALS — BP 150/82 | HR 89 | Temp 98.0°F | Resp 22 | Ht 67.0 in | Wt 344.2 lb

## 2015-06-02 DIAGNOSIS — R5381 Other malaise: Secondary | ICD-10-CM

## 2015-06-02 DIAGNOSIS — R8290 Unspecified abnormal findings in urine: Secondary | ICD-10-CM

## 2015-06-02 DIAGNOSIS — F332 Major depressive disorder, recurrent severe without psychotic features: Secondary | ICD-10-CM | POA: Diagnosis not present

## 2015-06-02 DIAGNOSIS — M1A9XX1 Chronic gout, unspecified, with tophus (tophi): Secondary | ICD-10-CM | POA: Diagnosis not present

## 2015-06-02 MED ORDER — FEBUXOSTAT 40 MG PO TABS: 40.0000 mg | ORAL_TABLET | Freq: Every day | ORAL | Status: DC

## 2015-06-02 MED ORDER — TEMAZEPAM 15 MG PO CAPS: 15.0000 mg | ORAL_CAPSULE | Freq: Every evening | ORAL | Status: DC | PRN

## 2015-06-02 MED ORDER — TEMAZEPAM 30 MG PO CAPS
30.0000 mg | ORAL_CAPSULE | Freq: Every evening | ORAL | Status: DC | PRN
Start: 2015-06-02 — End: 2015-10-25

## 2015-06-02 MED ORDER — SERTRALINE HCL 25 MG PO TABS: 25.0000 mg | ORAL_TABLET | Freq: Every day | ORAL | Status: DC

## 2015-06-02 MED ORDER — CLONAZEPAM 1 MG PO TABS: ORAL_TABLET | ORAL | Status: DC

## 2015-06-02 NOTE — Telephone Encounter (Signed)
Patient will be seen in office today.  

## 2015-06-02 NOTE — Progress Notes (Signed)
Patient ID: [name], female   DOB: 01-21-1935, 81 y.o.   MRN: [id number]    Location:    [name]   Place of Service:  OFFICE   Chief Complaint  Patient presents with  . Medical Management of Chronic Issues    f/u depression    HPI:  81 yo female seen today for f/u depression . She was started on sertraline 58m qhs last month and reports significant improvement in mood. She has started diet and reduced caloric intake. She lost 7 lbs since last month.  Gout - she is currently taking colcrys daily and insurance will no longer cover it. Has tried allopurinol but it was ineffective.she would like to try uloric.   She is a poor historian due to memory issues. Hx obtained from daughter.  Needs rx  Past Medical History  Diagnosis Date  . Hyperlipidemia     takes Pravastatin daily  . Coronary artery disease     a. s/p IMI 2004 tx with BMS to RCA;  b. s/p Promus DES to RCA 2/12 (cath: LM 20-30%, pLAD 20-30%, mLAD 50%, RI 30%, pRCA 60%, mRCA 90% - tx with PCI);  c. myoview 1/07: EF 49%, inf scar, no isch  . Urinary incontinence     takes Linzess daily  . Insomnia   . Osteoarthritis   . Obstructive sleep apnea   . Pol[place]yalgia rheumatica ([facility]   . Arthritis   . [place]oli ([place] 9[place]    felt to need lifelong anticoagulation  . Allergy     takes Mucinex daily as needed  . Gout, unspecified     takes Colchicine daily  . Dysuria   . Pain, chronic   . Osteoarthrosis, unspecified whether generalized or localized, lower leg   . Anemia, unspecified   . Coronary atherosclerosis of native coronary artery   . CHF (congestive heart failure) (HCC)     takes Furosemide daily  . Depression     takes Cymbalta daily  . Anxiety     takes Clonazepam daily as needed  . Hypertension     takes Imdur and Metoprolol daily  . GERD (gastroesophageal reflux disease)     takes Protonix daily  . Diabetes mellitus     insulin daily  . Type II or unspecified type diabetes mellitus without  mention of complication, uncontrolled   . Myocardial infarction ([place] 2004  . History of blood clots about [place]yrago    in legs-takes Coumadin   . Shortness of breath dyspnea     with exertion and has Albuterol inhaler prn  . Headache     occasionally  . Vertigo     hx of;was taking Meclizine if needed  . Numbness   . Joint pain   . Joint swelling   . Osteoarthritis     Past Surgical History  Procedure Laterality Date  . Appendectomy    . Cardiac catheterization    . Gastric bypass  1977     [facility] Blood clots/legs and lungs  2013  . Mi with stent placement  2004  . Left heart catheterization with coronary angiogram N/A 06/29/2014    Procedure: LEFT HEART CATHETERIZATION WITH CORONARY ANGIOGRAM;  Surgeon: ThTroy SineMD;  [facility];  Service: Cardiovascular;  Laterality: N/A;  . Coronary angioplasty  2  . Eye surgery Bilateral     cataract   . Colonoscopy    . Abdominal hysterectomy  partial  . Breast lumpectomy with radioactive seed localization Left 11/05/2014    Procedure: LEFT BREAST LUMPECTOMY WITH RADIOACTIVE SEED LOCALIZATION;  Surgeon: [name], MD;  Location: [place];  Service: General;  Laterality: Left;  . Excision of skin tag Right 11/05/2014    Procedure: EXCISION OF RIGHT EYELID SKIN TAG;  Surgeon: [name], MD;  Location: [place];  Service: General;  Laterality: Right;    Patient Care Team: Gildardo Cranker, DO as PCP - General (Internal Medicine) Renella Cunas, MD as Consulting Physician (Cardiology) [name], MD as Consulting Physician (Ophthalmology) [name], MD as Consulting Physician (General Surgery)  Social History   Social History  . Marital Status: Widowed    Spouse Name: N/A  . Number of Children: N/A  . Years of Education: N/A   Occupational History  . retired    Social History Main Topics  . Smoking status: Never Smoker   . Smokeless tobacco: Never Used  . Alcohol  Use: No  . Drug Use: No  . Sexual Activity: Not Currently   Other Topics Concern  . Not on file   Social History Narrative     reports that she has never smoked. She has never used smokeless tobacco. She reports that she does not drink alcohol or use illicit drugs.  Allergies  Allergen Reactions  . Sulfonamide Derivatives Swelling    Mouth swelling  . Tramadol Nausea And Vomiting    Medications: Patient's Medications  New Prescriptions   No medications on file  Previous Medications   ALBUTEROL (PROVENTIL HFA;VENTOLIN HFA) 108 (90 BASE) MCG/ACT INHALER    Inhale 1-2 puffs into the lungs every 6 (six) hours as needed for wheezing or shortness of breath.   ALLOPURINOL (ZYLOPRIM) 300 MG TABLET    Take 300 mg by mouth.   ANASTROZOLE (ARIMIDEX) 1 MG TABLET    Take 1 tablet (1 mg total) by mouth daily.   BUDESONIDE-FORMOTEROL (SYMBICORT) 160-4.5 MCG/ACT INHALER    In 2 puffs twice daily to help wheezing   CALCIUM CARBONATE ANTACID (TUMS PO)    Take 3 tablets by mouth daily as needed (acid reflux).   CLONAZEPAM (KLONOPIN) 1 MG TABLET    TAKE TWO TABLETS BY MOUTH AT BEDTIME AS NEEDED FOR SLEEP   COLCRYS 0.6 MG TABLET    TAKE ONE TABLET BY MOUTH ONCE DAILY FOR  GOUT   DIPHENHYDRAMINE (BENADRYL) 25 MG TABLET    Take 50 mg by mouth daily as needed for itching. For itching   DULOXETINE (CYMBALTA) 60 MG CAPSULE    Take 1 capsule by mouth every day for anxiety   ENOXAPARIN (LOVENOX) 150 MG/ML INJECTION    Inject 1 mL (150 mg total) into the skin every 12 (twelve) hours.   ERGOCALCIFEROL (VITAMIN D2) 50000 UNITS CAPSULE    Take 1 capsule (50,000 Units total) by mouth once a week.   FUROSEMIDE (LASIX) 20 MG TABLET    Take 20 mg by mouth as needed.   INSULIN GLARGINE (TOUJEO SOLOSTAR) 300 UNIT/ML SOPN    Inject 45 Units into the skin 2 (two) times daily before a meal. Titrate up as needed.   ISOSORBIDE MONONITRATE (IMDUR) 60 MG 24 HR TABLET    TAKE ONE TABLET BY MOUTH ONCE DAILY   LANCETS  (ONETOUCH ULTRASOFT) LANCETS    Use to test blood sugar Dx: E11.9   MECLIZINE (ANTIVERT) 25 MG TABLET    Take 1 tablet (25 mg total) by mouth 3 (three) times daily.  METOPROLOL SUCCINATE (TOPROL-XL) 25 MG 24 HR TABLET    Take 1 tablet by mouth once every day for blood pressure   NITROGLYCERIN (NITROSTAT) 0.4 MG SL TABLET    Take one tablet under the tongue every 5 minutes as needed for chest pain   OXYCODONE-ACETAMINOPHEN (PERCOCET) 10-325 MG TABLET    Take one tablet every 6 hours as needed for pain   PANTOPRAZOLE (PROTONIX) 40 MG TABLET    TAKE ONE TABLET BY MOUTH ONCE DAILY   PRAVASTATIN (PRAVACHOL) 20 MG TABLET    Take one tablet by mouth once daily for cholesterol   SERTRALINE (ZOLOFT) 25 MG TABLET    Take 1 tablet (25 mg total) by mouth at bedtime.   TEMAZEPAM (RESTORIL) 15 MG CAPSULE    Take 1 capsule (15 mg total) by mouth at bedtime as needed for sleep. Take with 40m capsule   TEMAZEPAM (RESTORIL) 30 MG CAPSULE    Take 1 capsule (30 mg total) by mouth at bedtime as needed. for sleep   WARFARIN (COUMADIN) 5 MG TABLET    Take 1 tablet daily except 1 & 1/2 tablets on Mondays, Wednesdays and Fridays  Modified Medications   No medications on file  Discontinued Medications   No medications on file    Review of Systems  Unable to perform ROS: Other   Memory loss Filed Vitals:   06/02/15 1539  BP: 150/82  Pulse: 89  Temp: 98 F (36.7 C)  TempSrc: Oral  Resp: 22  Height: 5' 7" (1.702 m)  Weight: 344 lb 3.2 oz (156.128 kg)  SpO2: 93%   Body mass index is 53.9 kg/(m^2).  Physical Exam  Constitutional: She appears well-developed.  Neck: Neck supple.  Cardiovascular: Normal rate, regular rhythm, normal heart sounds and intact distal pulses.  Exam reveals no gallop and no friction rub.   No murmur heard. Trace LE edema b/l. No calf TTP  Pulmonary/Chest: Effort normal and breath sounds normal. No respiratory distress. She has no wheezes. She has no rales. She exhibits no  tenderness.  Abdominal: Soft. Bowel sounds are normal. She exhibits no distension and no mass. There is no tenderness. There is no rebound and no guarding.  Musculoskeletal: She exhibits edema and tenderness.  Lymphadenopathy:    She has no cervical adenopathy.  Neurological: She is alert. She displays atrophy. Gait abnormal.  Sitting in w/c. Unable to stand with significant assistance  Skin: Skin is warm and dry. No rash noted.  Psychiatric: She has a normal mood and affect. Her behavior is normal.     Labs reviewed: Appointment on 04/26/2015  Component Date Value Ref Range Status  . WBC 04/26/2015 8.5  3.9 - 10.3 10e3/uL Final  . NEUT# 04/26/2015 5.5  1.5 - 6.5 10e3/uL Final  . HGB 04/26/2015 10.4* 11.6 - 15.9 g/dL Final  . HCT 04/26/2015 33.4* 34.8 - 46.6 % Final  . Platelets 04/26/2015 345  145 - 400 10e3/uL Final  . MCV 04/26/2015 85.6  79.5 - 101.0 fL Final  . MCH 04/26/2015 26.7  25.1 - 34.0 pg Final  . MCHC 04/26/2015 31.1* 31.5 - 36.0 g/dL Final  . RBC 04/26/2015 3.90  3.70 - 5.45 10e6/uL Final  . RDW 04/26/2015 14.7* 11.2 - 14.5 % Final  . lymph# 04/26/2015 2.3  0.9 - 3.3 10e3/uL Final  . MONO# 04/26/2015 0.6  0.1 - 0.9 10e3/uL Final  . Eosinophils Absolute 04/26/2015 0.2  0.0 - 0.5 10e3/uL Final  . Basophils Absolute 04/26/2015 0.0  0.0 - 0.1 10e3/uL Final  . NEUT% 04/26/2015 64.3  38.4 - 76.8 % Final  . LYMPH% 04/26/2015 27.0  14.0 - 49.7 % Final  . MONO% 04/26/2015 6.6  0.0 - 14.0 % Final  . EOS% 04/26/2015 2.0  0.0 - 7.0 % Final  . BASO% 04/26/2015 0.1  0.0 - 2.0 % Final  . Retic % 04/26/2015 2.05  0.70 - 2.10 % Final  . Retic Ct Abs 04/26/2015 79.95  33.70 - 90.70 10e3/uL Final  . Immature Retic Fract 04/26/2015 15.30* 1.60 - 10.00 % Final  . Sodium 04/26/2015 135* 136 - 145 mEq/L Final  . Potassium 04/26/2015 4.5  3.5 - 5.1 mEq/L Final  . Chloride 04/26/2015 99  98 - 109 mEq/L Final  . CO2 04/26/2015 26  22 - 29 mEq/L Final  . Glucose 04/26/2015 405* 70 - 140  mg/dl Final   Glucose reference range is for nonfasting patients. Fasting glucose reference range is 70- 100.  [name] BUN 04/26/2015 18.4  7.0 - 26.0 mg/dL Final  . Creatinine 04/26/2015 1.4* 0.6 - 1.1 mg/dL Final  . Total Bilirubin 04/26/2015 0.43  0.20 - 1.20 mg/dL Final  . Alkaline Phosphatase 04/26/2015 80  40 - 150 U/L Final  . AST 04/26/2015 32  5 - 34 U/L Final  . ALT 04/26/2015 36  0 - 55 U/L Final  . Total Protein 04/26/2015 8.3  6.4 - 8.3 g/dL Final  . Albumin 04/26/2015 3.2* 3.5 - 5.0 g/dL Final  . Calcium 04/26/2015 9.3  8.4 - 10.4 mg/dL Final  . Anion Gap 04/26/2015 10  3 - 11 mEq/L Final  . EGFR 04/26/2015 41* >90 ml/min/1.73 m2 Final   eGFR is calculated using the CKD-EPI Creatinine Equation (2009)  Appointment on 04/20/2015  Component Date Value Ref Range Status  . Glucose 04/20/2015 198* 65 - 99 mg/dL Final  . BUN 04/20/2015 12  8 - 27 mg/dL Final  . Creatinine, Ser 04/20/2015 1.10* 0.57 - 1.00 mg/dL Final  . GFR calc non Af Amer 04/20/2015 48* >59 mL/min/1.73 Final  . GFR calc Af Amer 04/20/2015 56* >59 mL/min/1.73 Final  . BUN/Creatinine Ratio 04/20/2015 11  11 - 26 Final  . Sodium 04/20/2015 141  134 - 144 mmol/L Final                 **Please note reference interval change**  . Potassium 04/20/2015 4.1  3.5 - 5.2 mmol/L Final  . Chloride 04/20/2015 99  96 - 106 mmol/L Final                 **Please note reference interval change**  . CO2 04/20/2015 23  18 - 29 mmol/L Final  . Calcium 04/20/2015 8.9  8.7 - 10.3 mg/dL Final  . ALT 04/20/2015 43* 0 - 32 IU/L Final  . Hgb A1c MFr Bld 04/20/2015 12.6* 4.8 - 5.6 % Final   Comment:          Pre-diabetes: 5.7 - 6.4          Diabetes: >6.4          Glycemic control for adults with diabetes: <7.0   . Est. average glucose Bld gHb Est-m* 04/20/2015 315   Final  . Cholesterol, Total 04/20/2015 136  100 - 199 mg/dL Final  . Triglycerides 04/20/2015 188* 0 - 149 mg/dL Final  . HDL 04/20/2015 40  >39 mg/dL Final  . VLDL  Cholesterol Cal 04/20/2015 38  5 - 40 mg/dL Final  .  LDL Calculated 04/20/2015 58  0 - 99 mg/dL Final  . Chol/HDL Ratio 04/20/2015 3.4  0.0 - 4.4 ratio units Final   Comment:                                   T. Chol/HDL Ratio                                             Men  Women                               1/2 Avg.Risk  3.4    3.3                                   Avg.Risk  5.0    4.4                                2X Avg.Risk  9.6    7.1                                3X Avg.Risk 23.4   11.0   Office Visit on 03/29/2015  Component Date Value Ref Range Status  . INR 03/29/2015 2.9   Final    No results found.   Assessment/Plan   ICD-9-CM ICD-10-CM   1. Severe episode of recurrent major depressive disorder, without psychotic features ([place]) 296.33 F33.2 sertraline (ZOLOFT) 25 MG tablet  2. Physical deconditioning 799.3 R53.81 Ambulatory referral to Home Health  3. Gout with tophus, unspecified cause, unspecified chronicity, unspecified site 274.82 [id number]   4. Foul smelling urine 791.9 R82.90 Urinalysis with Reflex Microscopic   Will call with PT appt. She is homebound and qualifies for [facility] PT  Cont diet and reduced caloric intake. She has already lost 7 lbs  Continue current medications as ordered  Use colcrys as needed for acute gout flares  Follow up in 2 mos for routine visit  Traevon Meiring S. Perlie Gold  [facility] [address] [place], [place] [address] [phone] Cell (Monday-Friday 8 AM - 5 PM) [phone] After 5 PM and follow prompts

## 2015-06-02 NOTE — Patient Instructions (Addendum)
Will call with PT appt.   Cont diet and reduced caloric intake.  Continue current medications as ordered  Use colcrys as needed for acute gout flares  Follow up in 2 mos for routine visit

## 2015-06-03 ENCOUNTER — Telehealth: Payer: Self-pay

## 2015-06-03 LAB — URINE CULTURE

## 2015-06-03 MED ORDER — CIPROFLOXACIN HCL 500 MG PO TABS: 500.0000 mg | ORAL_TABLET | Freq: Two times a day (BID) | ORAL | Status: DC

## 2015-06-03 NOTE — Telephone Encounter (Signed)
-----   Message from [place], [place] sent at 06/03/2015  3:42 PM EST ----- Urine cx reveals UTI - Rx Cipro 500mg  po q12 hrs #20 no rf; take probiotic daily x 2 weeks

## 2015-06-03 NOTE — Telephone Encounter (Signed)
Discussed results with [name], RX sent to the pharmacy. [name] will have patient eat yogurt as a probiotic.

## 2015-06-04 ENCOUNTER — Other Ambulatory Visit: Payer: Self-pay | Admitting: *Deleted

## 2015-06-07 DIAGNOSIS — E119 Type 2 diabetes mellitus without complications: Secondary | ICD-10-CM | POA: Diagnosis not present

## 2015-06-07 DIAGNOSIS — I130 Hypertensive heart and chronic kidney disease with heart failure and stage 1 through stage 4 chronic kidney disease, or unspecified chronic kidney disease: Secondary | ICD-10-CM | POA: Diagnosis not present

## 2015-06-07 DIAGNOSIS — M1A9XX1 Chronic gout, unspecified, with tophus (tophi): Secondary | ICD-10-CM | POA: Diagnosis not present

## 2015-06-07 DIAGNOSIS — R2689 Other abnormalities of gait and mobility: Secondary | ICD-10-CM | POA: Diagnosis not present

## 2015-06-07 DIAGNOSIS — E1122 Type 2 diabetes mellitus with diabetic chronic kidney disease: Secondary | ICD-10-CM | POA: Diagnosis not present

## 2015-06-07 DIAGNOSIS — N390 Urinary tract infection, site not specified: Secondary | ICD-10-CM | POA: Diagnosis not present

## 2015-06-07 DIAGNOSIS — M6281 Muscle weakness (generalized): Secondary | ICD-10-CM | POA: Diagnosis not present

## 2015-06-07 DIAGNOSIS — R5381 Other malaise: Secondary | ICD-10-CM | POA: Diagnosis not present

## 2015-06-07 DIAGNOSIS — F332 Major depressive disorder, recurrent severe without psychotic features: Secondary | ICD-10-CM | POA: Diagnosis not present

## 2015-06-09 ENCOUNTER — Other Ambulatory Visit: Payer: Self-pay | Admitting: Pharmacotherapy

## 2015-06-09 DIAGNOSIS — M1A9XX1 Chronic gout, unspecified, with tophus (tophi): Secondary | ICD-10-CM | POA: Diagnosis not present

## 2015-06-09 DIAGNOSIS — N390 Urinary tract infection, site not specified: Secondary | ICD-10-CM | POA: Diagnosis not present

## 2015-06-09 DIAGNOSIS — E1122 Type 2 diabetes mellitus with diabetic chronic kidney disease: Secondary | ICD-10-CM | POA: Diagnosis not present

## 2015-06-09 DIAGNOSIS — N183 Chronic kidney disease, stage 3 (moderate): Secondary | ICD-10-CM | POA: Diagnosis not present

## 2015-06-09 DIAGNOSIS — I5032 Chronic diastolic (congestive) heart failure: Secondary | ICD-10-CM | POA: Diagnosis not present

## 2015-06-09 DIAGNOSIS — M6281 Muscle weakness (generalized): Secondary | ICD-10-CM | POA: Diagnosis not present

## 2015-06-09 DIAGNOSIS — F332 Major depressive disorder, recurrent severe without psychotic features: Secondary | ICD-10-CM | POA: Diagnosis not present

## 2015-06-09 DIAGNOSIS — I25119 Atherosclerotic heart disease of native coronary artery with unspecified angina pectoris: Secondary | ICD-10-CM | POA: Diagnosis not present

## 2015-06-09 DIAGNOSIS — I130 Hypertensive heart and chronic kidney disease with heart failure and stage 1 through stage 4 chronic kidney disease, or unspecified chronic kidney disease: Secondary | ICD-10-CM | POA: Diagnosis not present

## 2015-06-09 DIAGNOSIS — C50912 Malignant neoplasm of unspecified site of left female breast: Secondary | ICD-10-CM | POA: Diagnosis not present

## 2015-06-10 DIAGNOSIS — H26491 Other secondary cataract, right eye: Secondary | ICD-10-CM | POA: Diagnosis not present

## 2015-06-10 DIAGNOSIS — Z961 Presence of intraocular lens: Secondary | ICD-10-CM | POA: Diagnosis not present

## 2015-06-13 ENCOUNTER — Encounter (HOSPITAL_COMMUNITY): Payer: Self-pay | Admitting: *Deleted

## 2015-06-13 ENCOUNTER — Emergency Department (HOSPITAL_COMMUNITY): Payer: PPO

## 2015-06-13 DIAGNOSIS — F419 Anxiety disorder, unspecified: Secondary | ICD-10-CM | POA: Insufficient documentation

## 2015-06-13 DIAGNOSIS — F329 Major depressive disorder, single episode, unspecified: Secondary | ICD-10-CM | POA: Insufficient documentation

## 2015-06-13 DIAGNOSIS — R0602 Shortness of breath: Secondary | ICD-10-CM | POA: Diagnosis not present

## 2015-06-13 DIAGNOSIS — Z79899 Other long term (current) drug therapy: Secondary | ICD-10-CM | POA: Diagnosis not present

## 2015-06-13 DIAGNOSIS — R42 Dizziness and giddiness: Secondary | ICD-10-CM | POA: Insufficient documentation

## 2015-06-13 DIAGNOSIS — Z7901 Long term (current) use of anticoagulants: Secondary | ICD-10-CM | POA: Insufficient documentation

## 2015-06-13 DIAGNOSIS — R079 Chest pain, unspecified: Secondary | ICD-10-CM | POA: Diagnosis not present

## 2015-06-13 DIAGNOSIS — Z862 Personal history of diseases of the blood and blood-forming organs and certain disorders involving the immune mechanism: Secondary | ICD-10-CM | POA: Diagnosis not present

## 2015-06-13 DIAGNOSIS — I509 Heart failure, unspecified: Secondary | ICD-10-CM | POA: Insufficient documentation

## 2015-06-13 DIAGNOSIS — R918 Other nonspecific abnormal finding of lung field: Secondary | ICD-10-CM | POA: Diagnosis not present

## 2015-06-13 DIAGNOSIS — Z794 Long term (current) use of insulin: Secondary | ICD-10-CM | POA: Insufficient documentation

## 2015-06-13 DIAGNOSIS — E1165 Type 2 diabetes mellitus with hyperglycemia: Secondary | ICD-10-CM | POA: Diagnosis not present

## 2015-06-13 DIAGNOSIS — G8929 Other chronic pain: Secondary | ICD-10-CM | POA: Diagnosis not present

## 2015-06-13 DIAGNOSIS — I2510 Atherosclerotic heart disease of native coronary artery without angina pectoris: Secondary | ICD-10-CM | POA: Diagnosis not present

## 2015-06-13 DIAGNOSIS — M1990 Unspecified osteoarthritis, unspecified site: Secondary | ICD-10-CM | POA: Insufficient documentation

## 2015-06-13 DIAGNOSIS — I10 Essential (primary) hypertension: Secondary | ICD-10-CM | POA: Insufficient documentation

## 2015-06-13 LAB — CBG MONITORING, ED: Glucose-Capillary: 475 mg/dL — ABNORMAL HIGH (ref 65–99)

## 2015-06-13 NOTE — ED Notes (Signed)
The pt has had chest pain sob dizziness for 2 weeks off and on  No pain  Chest now.  headached for 2 weeks and one now

## 2015-06-14 ENCOUNTER — Emergency Department (HOSPITAL_COMMUNITY): Payer: PPO

## 2015-06-14 ENCOUNTER — Encounter (HOSPITAL_COMMUNITY): Payer: Self-pay | Admitting: Radiology

## 2015-06-14 ENCOUNTER — Emergency Department (HOSPITAL_COMMUNITY)
Admission: EM | Admit: 2015-06-14 | Discharge: 2015-06-14 | Disposition: A | Discharge status: Home / Self Care | Payer: PPO | Attending: Emergency Medicine | Admitting: Emergency Medicine

## 2015-06-14 DIAGNOSIS — R079 Chest pain, unspecified: Secondary | ICD-10-CM | POA: Diagnosis not present

## 2015-06-14 DIAGNOSIS — R739 Hyperglycemia, unspecified: Secondary | ICD-10-CM

## 2015-06-14 DIAGNOSIS — R918 Other nonspecific abnormal finding of lung field: Secondary | ICD-10-CM | POA: Diagnosis not present

## 2015-06-14 LAB — CBC
HCT: 31.1 % — ABNORMAL LOW (ref 36.0–46.0)
Hemoglobin: 9.7 g/dL — ABNORMAL LOW (ref 12.0–15.0)
MCH: 26.5 pg (ref 26.0–34.0)
MCHC: 31.2 g/dL (ref 30.0–36.0)
MCV: 85 fL (ref 78.0–100.0)
Platelets: 314 10*3/uL (ref 150–400)
RBC: 3.66 MIL/uL — ABNORMAL LOW (ref 3.87–5.11)
RDW: 14.6 % (ref 11.5–15.5)
WBC: 13.8 10*3/uL — ABNORMAL HIGH (ref 4.0–10.5)

## 2015-06-14 LAB — BASIC METABOLIC PANEL
Anion gap: 12 (ref 5–15)
BUN: 19 mg/dL (ref 6–20)
CO2: 26 mmol/L (ref 22–32)
Calcium: 9 mg/dL (ref 8.9–10.3)
Chloride: 95 mmol/L — ABNORMAL LOW (ref 101–111)
Creatinine, Ser: 1.18 mg/dL — ABNORMAL HIGH (ref 0.44–1.00)
GFR calc Af Amer: 50 mL/min — ABNORMAL LOW (ref >60)
GFR calc non Af Amer: 43 mL/min — ABNORMAL LOW (ref >60)
Glucose, Bld: 550 mg/dL — ABNORMAL HIGH (ref 65–99)
Potassium: 5.2 mmol/L — ABNORMAL HIGH (ref 3.5–5.1)
Sodium: 133 mmol/L — ABNORMAL LOW (ref 135–145)

## 2015-06-14 LAB — BRAIN NATRIURETIC PEPTIDE: B Natriuretic Peptide: 55.4 pg/mL (ref 0.0–100.0)

## 2015-06-14 LAB — CBG MONITORING, ED
Glucose-Capillary: 423 mg/dL — ABNORMAL HIGH (ref 65–99)
Glucose-Capillary: 358 mg/dL — ABNORMAL HIGH (ref 65–99)

## 2015-06-14 LAB — PROTIME-INR
INR: 1.71 — ABNORMAL HIGH (ref 0.00–1.49)
Prothrombin Time: 20.1 seconds — ABNORMAL HIGH (ref 11.6–15.2)

## 2015-06-14 LAB — TROPONIN I: Troponin I: 0.03 ng/mL (ref <0.031)

## 2015-06-14 MED ORDER — INSULIN ASPART 100 UNIT/ML ~~LOC~~ SOLN
15.0000 [IU] | Freq: Once | SUBCUTANEOUS | Status: AC
  Administered 2015-06-14: 15 [IU] via SUBCUTANEOUS
  Filled 2015-06-14: qty 1

## 2015-06-14 MED ORDER — IOHEXOL 350 MG/ML SOLN
100.0000 mL | Freq: Once | INTRAVENOUS | Status: AC | PRN
  Administered 2015-06-14: 100 mL via INTRAVENOUS

## 2015-06-14 MED ORDER — INSULIN ASPART 100 UNIT/ML ~~LOC~~ SOLN
10.0000 [IU] | Freq: Once | SUBCUTANEOUS | Status: AC
  Administered 2015-06-14: 10 [IU] via INTRAVENOUS
  Filled 2015-06-14: qty 1

## 2015-06-14 NOTE — ED Notes (Signed)
Called lab d/t delay in CBC, BMP, trop - they are running blood work at this time.

## 2015-06-14 NOTE — Discharge Instructions (Signed)
Hyperglycemia High blood sugar (hyperglycemia) means that the level of sugar in your blood is higher than it should be. Signs of high blood sugar include:  Feeling thirsty.  Frequent peeing (urinating).  Feeling tired or sleepy.  Dry mouth.  Vision changes.  Feeling weak.  Feeling hungry but losing weight.  Numbness and tingling in your hands or feet.  Headache. When you ignore these signs, your blood sugar may keep going up. These problems may get worse, and other problems may begin. HOME CARE  Check your blood sugars as told by your doctor. Write down the numbers with the date and time.  Take the right amount of insulin or diabetes pills at the right time. Write down the dose with date and time.  Refill your insulin or diabetes pills before running out.  Watch what you eat. Follow your meal plan.  Drink liquids without sugar, such as water. Check with your doctor if you have kidney or heart disease.  Follow your doctor's orders for exercise. Exercise at the same time of day.  Keep your doctor's appointments. GET HELP RIGHT AWAY IF:   You have trouble thinking or are confused.  You have fast breathing with fruity smelling breath.  You pass out (faint).  You have 2 to 3 days of high blood sugars and you do not know why.  You have chest pain.  You are feeling sick to your stomach (nauseous) or throwing up (vomiting).  You have sudden vision changes. MAKE SURE YOU:   Understand these instructions.  Will watch your condition.  Will get help right away if you are not doing well or get worse.   This information is not intended to replace advice given to you by your health care provider. Make sure you discuss any questions you have with your health care provider.   Document Released: 02/19/2009 Document Revised: 05/15/2014 Document Reviewed: 12/29/2014 Elsevier Interactive Patient Education 2016 Elsevier Inc.  Nonspecific Chest Pain  Chest pain can be  caused by many different conditions. There is always a chance that your pain could be related to something serious, such as a heart attack or a blood clot in your lungs. Chest pain can also be caused by conditions that are not life-threatening. If you have chest pain, it is very important to follow up with your health care provider. CAUSES  Chest pain can be caused by:  Heartburn.  Pneumonia or bronchitis.  Anxiety or stress.  Inflammation around your heart (pericarditis) or lung (pleuritis or pleurisy).  A blood clot in your lung.  A collapsed lung (pneumothorax). It can develop suddenly on its own (spontaneous pneumothorax) or from trauma to the chest.  Shingles infection (varicella-zoster virus).  Heart attack.  Damage to the bones, muscles, and cartilage that make up your chest wall. This can include:  Bruised bones due to injury.  Strained muscles or cartilage due to frequent or repeated coughing or overwork.  Fracture to one or more ribs.  Sore cartilage due to inflammation (costochondritis). RISK FACTORS  Risk factors for chest pain may include:  Activities that increase your risk for trauma or injury to your chest.  Respiratory infections or conditions that cause frequent coughing.  Medical conditions or overeating that can cause heartburn.  Heart disease or family history of heart disease.  Conditions or health behaviors that increase your risk of developing a blood clot.  Having had chicken pox (varicella zoster). SIGNS AND SYMPTOMS Chest pain can feel like:  Burning or tingling on  the surface of your chest or deep in your chest.  Crushing, pressure, aching, or squeezing pain.  Dull or sharp pain that is worse when you move, cough, or take a deep breath.  Pain that is also felt in your back, neck, shoulder, or arm, or pain that spreads to any of these areas. Your chest pain may come and go, or it may stay constant. DIAGNOSIS Lab tests or other studies  may be needed to find the cause of your pain. Your health care provider may have you take a test called an ambulatory ECG (electrocardiogram). An ECG records your heartbeat patterns at the time the test is performed. You may also have other tests, such as:  Transthoracic echocardiogram (TTE). During echocardiography, sound waves are used to create a picture of all of the heart structures and to look at how blood flows through your heart.  Transesophageal echocardiogram (TEE).This is a more advanced imaging test that obtains images from inside your body. It allows your health care provider to see your heart in finer detail.  Cardiac monitoring. This allows your health care provider to monitor your heart rate and rhythm in real time.  Holter monitor. This is a portable device that records your heartbeat and can help to diagnose abnormal heartbeats. It allows your health care provider to track your heart activity for several days, if needed.  Stress tests. These can be done through exercise or by taking medicine that makes your heart beat more quickly.  Blood tests.  Imaging tests. TREATMENT  Your treatment depends on what is causing your chest pain. Treatment may include:  Medicines. These may include:  Acid blockers for heartburn.  Anti-inflammatory medicine.  Pain medicine for inflammatory conditions.  Antibiotic medicine, if an infection is present.  Medicines to dissolve blood clots.  Medicines to treat coronary artery disease.  Supportive care for conditions that do not require medicines. This may include:  Resting.  Applying heat or cold packs to injured areas.  Limiting activities until pain decreases. HOME CARE INSTRUCTIONS  If you were prescribed an antibiotic medicine, finish it all even if you start to feel better.  Avoid any activities that bring on chest pain.  Do not use any tobacco products, including cigarettes, chewing tobacco, or electronic cigarettes. If  you need help quitting, ask your health care provider.  Do not drink alcohol.  Take medicines only as directed by your health care provider.  Keep all follow-up visits as directed by your health care provider. This is important. This includes any further testing if your chest pain does not go away.  If heartburn is the cause for your chest pain, you may be told to keep your head raised (elevated) while sleeping. This reduces the chance that acid will go from your stomach into your esophagus.  Make lifestyle changes as directed by your health care provider. These may include:  Getting regular exercise. Ask your health care provider to suggest some activities that are safe for you.  Eating a heart-healthy diet. A registered dietitian can help you to learn healthy eating options.  Maintaining a healthy weight.  Managing diabetes, if necessary.  Reducing stress. SEEK MEDICAL CARE IF:  Your chest pain does not go away after treatment.  You have a rash with blisters on your chest.  You have a fever. SEEK IMMEDIATE MEDICAL CARE IF:   Your chest pain is worse.  You have an increasing cough, or you cough up blood.  You have severe abdominal pain.  You have severe weakness.  You faint.  You have chills.  You have sudden, unexplained chest discomfort.  You have sudden, unexplained discomfort in your arms, back, neck, or jaw.  You have shortness of breath at any time.  You suddenly start to sweat, or your skin gets clammy.  You feel nauseous or you vomit.  You suddenly feel light-headed or dizzy.  Your heart begins to beat quickly, or it feels like it is skipping beats. These symptoms may represent a serious problem that is an emergency. Do not wait to see if the symptoms will go away. Get medical help right away. Call your local emergency services (911 in the [place] Do not drive yourself to the hospital.   This information is not intended to replace advice given to you  by your health care provider. Make sure you discuss any questions you have with your health care provider.   Document Released: 02/01/2005 Document Revised: 05/15/2014 Document Reviewed: 11/28/2013 Elsevier Interactive Patient Education [employer].

## 2015-06-14 NOTE — ED Notes (Signed)
Pt taken to CT.

## 2015-06-14 NOTE — ED Notes (Signed)
IV attempted x 1, pt requesting IV team, refusing IV placement in [facility], Informed pt that IV must be in forearm or higher, pt willing to have placement in forearm

## 2015-06-14 NOTE — ED Provider Notes (Signed)
CSN: [id number]     Arrival date & time 06/13/15  2320 History  By signing my name below, I, [name], attest that this documentation has been prepared under the direction and in the presence of [name], MD. Electronically Signed: Helane [name], ED Scribe. 06/14/2015. 3:00 AM.     Chief Complaint  Patient presents with  . Chest Pain   The history is provided by the patient. No language interpreter was used.   HPI Comments: [name] is a 81 y.o. female with a PMHx of MI, CAD, coronary atherosclerosis, CHF, PE, DM, HLD, HTN, and GERD as well as a PSHx of cardiac catheterization who presents to the Emergency Department complaining of intermittent, aching chest pain onset 2 weeks ago, worsening significantly as of today. She rated the pain as a 5/10 on arrival, and states it has now worsened to an 8/10. She reports associated SOB, dizziness, HA, and dyspnea on exertion.    Past Medical History  Diagnosis Date  . Hyperlipidemia     takes Pravastatin daily  . Coronary artery disease     a. s/p IMI 2004 tx with BMS to RCA;  b. s/p Promus DES to RCA 2/12 (cath: LM 20-30%, pLAD 20-30%, mLAD 50%, RI 30%, pRCA 60%, mRCA 90% - tx with PCI);  c. myoview 1/07: EF 49%, inf scar, no isch  . Urinary incontinence     takes Linzess daily  . Insomnia   . Osteoarthritis   . Obstructive sleep apnea   . Polymyalgia rheumatica ([place])   . Arthritis   . Pulmonary emboli ([place]) 9/13    felt to need lifelong anticoagulation  . Allergy     takes Mucinex daily as needed  . Gout, unspecified     takes Colchicine daily  . Dysuria   . Pain, chronic   . Osteoarthrosis, unspecified whether generalized or localized, lower leg   . Anemia, unspecified   . Coronary atherosclerosis of native coronary artery   . CHF (congestive heart failure) (HCC)     takes Furosemide daily  . Depression     takes Cymbalta daily  . Anxiety     takes Clonazepam daily as needed  . Hypertension     takes  Imdur and Metoprolol daily  . GERD (gastroesophageal reflux disease)     takes Protonix daily  . Diabetes mellitus     insulin daily  . Type II or unspecified type diabetes mellitus without mention of complication, uncontrolled   . Myocardial infarction ([place]) 2004  . History of blood clots about 26yrs ago    in legs-takes Coumadin   . Shortness of breath dyspnea     with exertion and has Albuterol inhaler prn  . Headache     occasionally  . Vertigo     hx of;was taking Meclizine if needed  . Numbness   . Joint pain   . Joint swelling   . Osteoarthritis    Past Surgical History  Procedure Laterality Date  . Appendectomy    . Cardiac catheterization    . Gastric bypass  1977     reversed in 1979, [facility]  . Blood clots/legs and lungs  2013  . Mi with stent placement  2004  . Left heart catheterization with coronary angiogram N/A 06/29/2014    Procedure: LEFT HEART CATHETERIZATION WITH CORONARY ANGIOGRAM;  Surgeon: Troy Sine, MD;  Location: [facility] CATH LAB;  Service: Cardiovascular;  Laterality: N/A;  . Coronary angioplasty  2  .  Eye surgery Bilateral     cataract   . Colonoscopy    . Abdominal hysterectomy      partial  . Breast lumpectomy with radioactive seed localization Left 11/05/2014    Procedure: LEFT BREAST LUMPECTOMY WITH RADIOACTIVE SEED LOCALIZATION;  Surgeon: [name], MD;  Location: [place];  Service: General;  Laterality: Left;  . Excision of skin tag Right 11/05/2014    Procedure: EXCISION OF RIGHT EYELID SKIN TAG;  Surgeon: [name], MD;  Location: [place];  Service: General;  Laterality: Right;   Family History  Problem Relation Age of Onset  . Breast cancer Mother 24  . Heart disease Mother   . Throat cancer Father   . Hypertension Father   . Arthritis Father   . Diabetes Father   . Arthritis Sister   . Obesity Sister   . Diabetes Sister    Social History  Substance Use Topics  . Smoking status: Never Smoker   . Smokeless tobacco:  Never Used  . Alcohol Use: No   OB History    No data available     Review of Systems  Respiratory: Positive for shortness of breath.   Cardiovascular: Positive for chest pain.  Neurological: Positive for dizziness and headaches.  All other systems reviewed and are negative.   Allergies  Sulfonamide derivatives and Tramadol  Home Medications   Prior to Admission medications   Medication Sig Start Date End Date Taking? Authorizing Provider  albuterol (PROVENTIL HFA;VENTOLIN HFA) 108 (90 BASE) MCG/ACT inhaler Inhale 1-2 puffs into the lungs every 6 (six) hours as needed for wheezing or shortness of breath. 01/20/15  Yes Samantha Tripp Dowless, PA-C  anastrozole (ARIMIDEX) 1 MG tablet Take 1 tablet (1 mg total) by mouth daily. 04/26/15  Yes [name], MD  budesonide-formoterol [facility]) 160-4.5 MCG/ACT inhaler In 2 puffs twice daily to help wheezing Patient taking differently: Inhale 2 puffs into the lungs 2 (two) times daily as needed (shortness of breath).  09/23/14  Yes Estill Dooms, MD  Calcium Carbonate Antacid (TUMS PO) Take 3 tablets by mouth daily as needed (acid reflux).   Yes Historical Provider, MD  cholecalciferol (VITAMIN D) 1000 units tablet Take 1,000 Units by mouth daily.   Yes Historical Provider, MD  ciprofloxacin (CIPRO) 500 MG tablet Take 1 tablet (500 mg total) by mouth every 12 (twelve) hours. 06/03/15  Yes Gildardo Cranker, DO  clonazePAM (KLONOPIN) 1 MG tablet TAKE TWO TABLETS BY MOUTH AT BEDTIME AS NEEDED FOR SLEEP 06/02/15  Yes Gildardo Cranker, DO  diphenhydrAMINE (BENADRYL) 25 MG tablet Take 50 mg by mouth daily as needed for itching. For itching   Yes Historical Provider, MD  DULoxetine (CYMBALTA) 60 MG capsule Take 1 capsule by mouth every day for anxiety 12/14/14  Yes Tiffany L Reed, DO  febuxostat (ULORIC) 40 MG tablet Take 1 tablet (40 mg total) by mouth daily. 06/02/15  Yes Gildardo Cranker, DO  Insulin Glargine (TOUJEO SOLOSTAR) 300 UNIT/ML SOPN Inject 45  Units into the skin 2 (two) times daily before a meal. Titrate up as needed. Patient taking differently: Inject 60 Units into the skin 2 (two) times daily before a meal. Titrate up as needed. 10/07/14  Yes Estill Dooms, MD  isosorbide mononitrate (IMDUR) 60 MG 24 hr tablet TAKE ONE TABLET BY MOUTH ONCE DAILY 12/07/14  Yes Gildardo Cranker, DO  Lancets [facility] ULTRASOFT) lancets Use to test blood sugar Dx: E11.9 10/01/14  Yes Gildardo Cranker, DO  meclizine (ANTIVERT)  25 MG tablet Take 1 tablet (25 mg total) by mouth 3 (three) times daily. Patient taking differently: Take 25 mg by mouth 2 (two) times daily as needed for dizziness or nausea.  09/26/14  Yes Barton Dubois, MD  metoprolol succinate (TOPROL-XL) 25 MG 24 hr tablet Take 1 tablet by mouth once every day for blood pressure 12/14/14  Yes Tiffany L Reed, DO  nitroGLYCERIN (NITROSTAT) 0.4 MG SL tablet Take one tablet under the tongue every 5 minutes as needed for chest pain Patient taking differently: Place 0.4 mg under the tongue every 5 (five) minutes as needed for chest pain.  09/24/13  Yes [name], NP  pantoprazole (PROTONIX) 40 MG tablet TAKE ONE TABLET BY MOUTH ONCE DAILY 03/22/15  Yes Tiffany L Reed, DO  pravastatin (PRAVACHOL) 20 MG tablet Take one tablet by mouth once daily for cholesterol 04/30/15  Yes Gildardo Cranker, DO  sertraline (ZOLOFT) 25 MG tablet Take 1 tablet (25 mg total) by mouth at bedtime. Patient taking differently: Take 25 mg by mouth daily.  06/02/15  Yes Monica Carter, DO  temazepam (RESTORIL) 15 MG capsule Take 1 capsule (15 mg total) by mouth at bedtime as needed for sleep. Take with 30mg  capsule 06/02/15  Yes Monica Carter, DO  temazepam (RESTORIL) 30 MG capsule Take 1 capsule (30 mg total) by mouth at bedtime as needed. for sleep 06/02/15  Yes Gildardo Cranker, DO  warfarin (COUMADIN) 5 MG tablet Take 1 tablet daily except 1 & 1/2 tablets on Mondays, Wednesdays and Fridays 03/29/15  Yes Cathey Sabra Heck, RPH-CPP   BP 149/85  mmHg  Pulse 87  Temp(Src) 98.4 F (36.9 C) (Oral)  Resp 20  Ht 5\' 7"  (1.702 m)  SpO2 98% Physical Exam  Constitutional: She is oriented to person, place, and time. She appears well-developed and well-nourished. No distress.  HENT:  Head: Normocephalic and atraumatic.  Right Ear: Hearing normal.  Left Ear: Hearing normal.  Nose: Nose normal.  Mouth/Throat: Oropharynx is clear and moist and mucous membranes are normal.  Eyes: Conjunctivae and EOM are normal. Pupils are equal, round, and reactive to light.  Neck: Normal range of motion. Neck supple.  Cardiovascular: Regular rhythm, S1 normal and S2 normal.  Exam reveals no gallop and no friction rub.   No murmur heard. Pulmonary/Chest: Effort normal and breath sounds normal. No respiratory distress. She exhibits no tenderness.  Abdominal: Soft. Normal appearance and bowel sounds are normal. There is no hepatosplenomegaly. There is no tenderness. There is no rebound, no guarding, no tenderness at McBurney's point and negative Murphy's sign. No hernia.  Musculoskeletal: Normal range of motion. She exhibits edema.  1+ pitting edema to the BLE  Neurological: She is alert and oriented to person, place, and time. She has normal strength. No cranial nerve deficit or sensory deficit. Coordination normal. GCS eye subscore is 4. GCS verbal subscore is 5. GCS motor subscore is 6.  Skin: Skin is warm, dry and intact. No rash noted. No cyanosis.  Psychiatric: She has a normal mood and affect. Her speech is normal and behavior is normal. Thought content normal.  Nursing note and vitals reviewed.   ED Course  Procedures  DIAGNOSTIC STUDIES: Oxygen Saturation is 95% on RA, adequate by my interpretation.    COORDINATION OF CARE: 2:59 AM - Discussed lab and imaging results, as well as plans to order further diagnostic studies. Pt advised of plan for treatment and pt agrees.  Labs Review Labs Reviewed  BASIC METABOLIC PANEL -  Abnormal; Notable for  the following:    Sodium 133 (*)    Potassium 5.2 (*)    Chloride 95 (*)    Glucose, Bld 550 (*)    Creatinine, Ser 1.18 (*)    GFR calc non Af Amer 43 (*)    GFR calc Af Amer 50 (*)    All other components within normal limits  CBC - Abnormal; Notable for the following:    WBC 13.8 (*)    RBC 3.66 (*)    Hemoglobin 9.7 (*)    HCT 31.1 (*)    All other components within normal limits  PROTIME-INR - Abnormal; Notable for the following:    Prothrombin Time 20.1 (*)    INR 1.71 (*)    All other components within normal limits  CBG MONITORING, ED - Abnormal; Notable for the following:    Glucose-Capillary 475 (*)    All other components within normal limits  CBG MONITORING, ED - Abnormal; Notable for the following:    Glucose-Capillary 423 (*)    All other components within normal limits  CBG MONITORING, ED - Abnormal; Notable for the following:    Glucose-Capillary 358 (*)    All other components within normal limits  TROPONIN I  BRAIN NATRIURETIC PEPTIDE    Imaging Review Dg Chest 2 View  06/14/2015  CLINICAL DATA:  Chronic mid to left-sided chest pain. Initial encounter. EXAM: CHEST  2 VIEW COMPARISON:  Chest radiograph and CTA of the chest performed 01/20/2015 FINDINGS: The lungs are hypoexpanded. Vascular congestion and vascular crowding are noted. Mild bilateral atelectasis is seen. No pleural effusion or pneumothorax is seen. The heart is borderline normal in size. No acute osseous abnormalities are seen. Clips are noted within the right upper quadrant, reflecting prior cholecystectomy. IMPRESSION: Lungs hypoexpanded. Vascular congestion noted. Mild bilateral atelectasis seen. Electronically Signed   By: Garald Balding M.D.   On: 06/14/2015 00:10   Ct Angio Chest Pe W/cm &/or Wo Cm  06/14/2015  CLINICAL DATA:  Subacute onset of generalized chest pain, shortness of breath and dizziness. Headache. Initial encounter. EXAM: CT ANGIOGRAPHY CHEST WITH CONTRAST TECHNIQUE: Multidetector  CT imaging of the chest was performed using the standard protocol during bolus administration of intravenous contrast. Multiplanar CT image reconstructions and MIPs were obtained to evaluate the vascular anatomy. CONTRAST:  12mL OMNIPAQUE IOHEXOL 350 MG/ML SOLN COMPARISON:  Chest radiograph performed 06/13/2015, and CTA of the chest performed 01/20/2015 FINDINGS: There is no evidence of pulmonary embolus. The lungs appear essentially clear bilaterally. There is no evidence of significant focal consolidation, pleural effusion or pneumothorax. No masses are identified; no abnormal focal contrast enhancement is seen. Diffuse coronary artery calcifications are seen. No pericardial effusion is identified. No mediastinal lymphadenopathy is seen. The great vessels are grossly unremarkable in appearance. No axillary lymphadenopathy is seen. The visualized portions of the thyroid gland are unremarkable in appearance. The visualized portions of the liver and spleen are unremarkable. Postoperative change is noted about the gastroesophageal junction and gastric fundus. Stones are noted within the gallbladder. A prominent cyst is again noted at the upper pole of the right kidney. No acute osseous abnormalities are seen. Review of the MIP images confirms the above findings. IMPRESSION: 1. No evidence of pulmonary embolus. 2. Lungs essentially clear bilaterally. 3. Diffuse coronary artery calcifications seen. 4. Cholelithiasis. Electronically Signed   By: Garald Balding M.D.   On: 06/14/2015 05:59   I have personally reviewed and evaluated these images and lab results  as part of my medical decision-making.   EKG Interpretation   Date/Time:  Sunday June 13 2015 23:28:54 EST Ventricular Rate:  84 PR Interval:  172 QRS Duration: 92 QT Interval:  408 QTC Calculation: 482 R Axis:   25 Text Interpretation:  Normal sinus rhythm Normal ECG Confirmed by Dakota Vanwart   MD, Mame Twombly ([id number]) on 06/14/2015 2:38:28 AM       MDM   Final diagnoses:  Chest pain, unspecified chest pain type  Hyperglycemia    Patient presents to the ER for evaluation of chest pain. She has had previous cardiac catheterization that showed no coronary artery disease. Acute coronary syndrome, therefore, is not felt to be likely. Her EKG was normal. Troponin normal. Remainder of workup was unremarkable including PE study. Patient's blood sugar was elevated and this was treated with insulin.  I personally performed the services described in this documentation, which was scribed in my presence. The recorded information has been reviewed and is accurate.   [name], MD 06/15/15 [phone]

## 2015-06-15 DIAGNOSIS — N390 Urinary tract infection, site not specified: Secondary | ICD-10-CM | POA: Diagnosis not present

## 2015-06-15 DIAGNOSIS — N183 Chronic kidney disease, stage 3 (moderate): Secondary | ICD-10-CM | POA: Diagnosis not present

## 2015-06-15 DIAGNOSIS — I25119 Atherosclerotic heart disease of native coronary artery with unspecified angina pectoris: Secondary | ICD-10-CM | POA: Diagnosis not present

## 2015-06-15 DIAGNOSIS — E1122 Type 2 diabetes mellitus with diabetic chronic kidney disease: Secondary | ICD-10-CM | POA: Diagnosis not present

## 2015-06-15 DIAGNOSIS — M1A9XX1 Chronic gout, unspecified, with tophus (tophi): Secondary | ICD-10-CM | POA: Diagnosis not present

## 2015-06-15 DIAGNOSIS — F332 Major depressive disorder, recurrent severe without psychotic features: Secondary | ICD-10-CM | POA: Diagnosis not present

## 2015-06-15 DIAGNOSIS — M6281 Muscle weakness (generalized): Secondary | ICD-10-CM | POA: Diagnosis not present

## 2015-06-15 DIAGNOSIS — I130 Hypertensive heart and chronic kidney disease with heart failure and stage 1 through stage 4 chronic kidney disease, or unspecified chronic kidney disease: Secondary | ICD-10-CM | POA: Diagnosis not present

## 2015-06-15 DIAGNOSIS — C50912 Malignant neoplasm of unspecified site of left female breast: Secondary | ICD-10-CM | POA: Diagnosis not present

## 2015-06-15 DIAGNOSIS — I5032 Chronic diastolic (congestive) heart failure: Secondary | ICD-10-CM | POA: Diagnosis not present

## 2015-06-18 ENCOUNTER — Telehealth: Payer: Self-pay

## 2015-06-18 MED ORDER — OXYCODONE-ACETAMINOPHEN 10-325 MG PO TABS: 1.0000 | ORAL_TABLET | Freq: Four times a day (QID) | ORAL | Status: DC | PRN

## 2015-06-18 NOTE — Telephone Encounter (Signed)
Oked by Dr. Eulas Post to add medication back on patient's mediation list and print. Daughter picked up

## 2015-06-28 DIAGNOSIS — M1A9XX1 Chronic gout, unspecified, with tophus (tophi): Secondary | ICD-10-CM | POA: Diagnosis not present

## 2015-06-28 DIAGNOSIS — C50912 Malignant neoplasm of unspecified site of left female breast: Secondary | ICD-10-CM | POA: Diagnosis not present

## 2015-06-28 DIAGNOSIS — N390 Urinary tract infection, site not specified: Secondary | ICD-10-CM | POA: Diagnosis not present

## 2015-06-28 DIAGNOSIS — I5032 Chronic diastolic (congestive) heart failure: Secondary | ICD-10-CM | POA: Diagnosis not present

## 2015-06-28 DIAGNOSIS — M6281 Muscle weakness (generalized): Secondary | ICD-10-CM | POA: Diagnosis not present

## 2015-06-28 DIAGNOSIS — F332 Major depressive disorder, recurrent severe without psychotic features: Secondary | ICD-10-CM | POA: Diagnosis not present

## 2015-06-28 DIAGNOSIS — N183 Chronic kidney disease, stage 3 (moderate): Secondary | ICD-10-CM | POA: Diagnosis not present

## 2015-06-28 DIAGNOSIS — E1122 Type 2 diabetes mellitus with diabetic chronic kidney disease: Secondary | ICD-10-CM | POA: Diagnosis not present

## 2015-06-28 DIAGNOSIS — I130 Hypertensive heart and chronic kidney disease with heart failure and stage 1 through stage 4 chronic kidney disease, or unspecified chronic kidney disease: Secondary | ICD-10-CM | POA: Diagnosis not present

## 2015-06-28 DIAGNOSIS — I25119 Atherosclerotic heart disease of native coronary artery with unspecified angina pectoris: Secondary | ICD-10-CM | POA: Diagnosis not present

## 2015-07-07 DIAGNOSIS — I5032 Chronic diastolic (congestive) heart failure: Secondary | ICD-10-CM | POA: Diagnosis not present

## 2015-07-07 DIAGNOSIS — E1122 Type 2 diabetes mellitus with diabetic chronic kidney disease: Secondary | ICD-10-CM | POA: Diagnosis not present

## 2015-07-07 DIAGNOSIS — I25119 Atherosclerotic heart disease of native coronary artery with unspecified angina pectoris: Secondary | ICD-10-CM | POA: Diagnosis not present

## 2015-07-07 DIAGNOSIS — N390 Urinary tract infection, site not specified: Secondary | ICD-10-CM | POA: Diagnosis not present

## 2015-07-07 DIAGNOSIS — C50912 Malignant neoplasm of unspecified site of left female breast: Secondary | ICD-10-CM | POA: Diagnosis not present

## 2015-07-07 DIAGNOSIS — F332 Major depressive disorder, recurrent severe without psychotic features: Secondary | ICD-10-CM | POA: Diagnosis not present

## 2015-07-07 DIAGNOSIS — M6281 Muscle weakness (generalized): Secondary | ICD-10-CM | POA: Diagnosis not present

## 2015-07-07 DIAGNOSIS — I130 Hypertensive heart and chronic kidney disease with heart failure and stage 1 through stage 4 chronic kidney disease, or unspecified chronic kidney disease: Secondary | ICD-10-CM | POA: Diagnosis not present

## 2015-07-07 DIAGNOSIS — N183 Chronic kidney disease, stage 3 (moderate): Secondary | ICD-10-CM | POA: Diagnosis not present

## 2015-07-07 DIAGNOSIS — M1A9XX1 Chronic gout, unspecified, with tophus (tophi): Secondary | ICD-10-CM | POA: Diagnosis not present

## 2015-07-08 ENCOUNTER — Other Ambulatory Visit: Payer: Self-pay | Admitting: Internal Medicine

## 2015-07-14 ENCOUNTER — Other Ambulatory Visit: Payer: Self-pay | Admitting: Internal Medicine

## 2015-07-16 ENCOUNTER — Other Ambulatory Visit: Payer: Self-pay

## 2015-07-16 DIAGNOSIS — C50912 Malignant neoplasm of unspecified site of left female breast: Secondary | ICD-10-CM | POA: Diagnosis not present

## 2015-07-16 MED ORDER — OXYCODONE-ACETAMINOPHEN 10-325 MG PO TABS: 1.0000 | ORAL_TABLET | Freq: Four times a day (QID) | ORAL | Status: DC | PRN

## 2015-07-25 ENCOUNTER — Other Ambulatory Visit: Payer: Self-pay | Admitting: Internal Medicine

## 2015-07-27 ENCOUNTER — Telehealth: Payer: Self-pay | Admitting: Hematology

## 2015-07-27 ENCOUNTER — Other Ambulatory Visit: Payer: Self-pay | Admitting: *Deleted

## 2015-07-27 DIAGNOSIS — C50912 Malignant neoplasm of unspecified site of left female breast: Secondary | ICD-10-CM

## 2015-07-27 NOTE — Telephone Encounter (Signed)
Per 3/21 pof moved 3/22 lab/fu to 3/24. Left message for patient re change and new date/time.

## 2015-07-28 ENCOUNTER — Ambulatory Visit: Payer: Medicare PPO | Admitting: Hematology

## 2015-07-28 ENCOUNTER — Other Ambulatory Visit: Payer: Medicare PPO

## 2015-07-29 ENCOUNTER — Other Ambulatory Visit: Payer: Self-pay | Admitting: Hematology

## 2015-07-29 ENCOUNTER — Ambulatory Visit
Admission: RE | Admit: 2015-07-29 | Discharge: 2015-07-29 | Disposition: A | Discharge status: Home / Self Care | Payer: PPO | Source: Ambulatory Visit | Attending: Hematology | Admitting: Hematology

## 2015-07-29 DIAGNOSIS — R928 Other abnormal and inconclusive findings on diagnostic imaging of breast: Secondary | ICD-10-CM | POA: Diagnosis not present

## 2015-07-29 DIAGNOSIS — C50912 Malignant neoplasm of unspecified site of left female breast: Secondary | ICD-10-CM

## 2015-07-29 DIAGNOSIS — R921 Mammographic calcification found on diagnostic imaging of breast: Secondary | ICD-10-CM

## 2015-07-29 LAB — HM MAMMOGRAPHY

## 2015-07-30 ENCOUNTER — Ambulatory Visit (HOSPITAL_BASED_OUTPATIENT_CLINIC_OR_DEPARTMENT_OTHER): Payer: PPO | Admitting: Hematology

## 2015-07-30 ENCOUNTER — Telehealth: Payer: Self-pay | Admitting: Hematology

## 2015-07-30 ENCOUNTER — Encounter: Payer: Self-pay | Admitting: *Deleted

## 2015-07-30 ENCOUNTER — Other Ambulatory Visit (HOSPITAL_BASED_OUTPATIENT_CLINIC_OR_DEPARTMENT_OTHER): Payer: PPO

## 2015-07-30 VITALS — BP 152/72 | HR 77 | Temp 97.7°F | Resp 18 | Ht 67.0 in | Wt 341.3 lb

## 2015-07-30 DIAGNOSIS — Z79811 Long term (current) use of aromatase inhibitors: Secondary | ICD-10-CM | POA: Diagnosis not present

## 2015-07-30 DIAGNOSIS — C50912 Malignant neoplasm of unspecified site of left female breast: Secondary | ICD-10-CM

## 2015-07-30 DIAGNOSIS — Z170 Estrogen receptor positive status [ER+]: Secondary | ICD-10-CM | POA: Diagnosis not present

## 2015-07-30 DIAGNOSIS — C50112 Malignant neoplasm of central portion of left female breast: Secondary | ICD-10-CM

## 2015-07-30 DIAGNOSIS — E559 Vitamin D deficiency, unspecified: Secondary | ICD-10-CM

## 2015-07-30 LAB — CBC & DIFF AND RETIC
BASO%: 0.2 % (ref 0.0–2.0)
Basophils Absolute: 0 10*3/uL (ref 0.0–0.1)
EOS%: 2.3 % (ref 0.0–7.0)
Eosinophils Absolute: 0.3 10*3/uL (ref 0.0–0.5)
HCT: 32.1 % — ABNORMAL LOW (ref 34.8–46.6)
HGB: 9.9 g/dL — ABNORMAL LOW (ref 11.6–15.9)
Immature Retic Fract: 16.3 % — ABNORMAL HIGH (ref 1.60–10.00)
LYMPH%: 27.6 % (ref 14.0–49.7)
MCH: 26.1 pg (ref 25.1–34.0)
MCHC: 30.8 g/dL — ABNORMAL LOW (ref 31.5–36.0)
MCV: 84.5 fL (ref 79.5–101.0)
MONO#: 0.6 10*3/uL (ref 0.1–0.9)
MONO%: 5.4 % (ref 0.0–14.0)
NEUT#: 7.1 10*3/uL — ABNORMAL HIGH (ref 1.5–6.5)
NEUT%: 64.5 % (ref 38.4–76.8)
Platelets: 284 10*3/uL (ref 145–400)
RBC: 3.8 10*6/uL (ref 3.70–5.45)
RDW: 15.4 % — ABNORMAL HIGH (ref 11.2–14.5)
Retic %: 1.68 % (ref 0.70–2.10)
Retic Ct Abs: 63.84 10*3/uL (ref 33.70–90.70)
WBC: 10.9 10*3/uL — ABNORMAL HIGH (ref 3.9–10.3)
lymph#: 3 10*3/uL (ref 0.9–3.3)

## 2015-07-30 LAB — COMPREHENSIVE METABOLIC PANEL
ALT: 17 U/L (ref 0–55)
AST: 17 U/L (ref 5–34)
Albumin: 2.9 g/dL — ABNORMAL LOW (ref 3.5–5.0)
Alkaline Phosphatase: 79 U/L (ref 40–150)
Anion Gap: 9 mEq/L (ref 3–11)
BUN: 17.8 mg/dL (ref 7.0–26.0)
CO2: 27 mEq/L (ref 22–29)
Calcium: 9 mg/dL (ref 8.4–10.4)
Chloride: 103 mEq/L (ref 98–109)
Creatinine: 1.3 mg/dL — ABNORMAL HIGH (ref 0.6–1.1)
EGFR: 46 mL/min/{1.73_m2} — ABNORMAL LOW (ref >90)
Glucose: 303 mg/dl — ABNORMAL HIGH (ref 70–140)
Potassium: 4.1 mEq/L (ref 3.5–5.1)
Sodium: 139 mEq/L (ref 136–145)
Total Bilirubin: 0.6 mg/dL (ref 0.20–1.20)
Total Protein: 7.9 g/dL (ref 6.4–8.3)

## 2015-07-30 MED ORDER — ANASTROZOLE 1 MG PO TABS: 1.0000 mg | ORAL_TABLET | Freq: Every day | ORAL | Status: DC

## 2015-07-30 NOTE — Telephone Encounter (Signed)
per pfoto sh pt appt-gave pt copy of avs

## 2015-08-02 ENCOUNTER — Encounter: Payer: Self-pay | Admitting: Hematology

## 2015-08-02 DIAGNOSIS — E559 Vitamin D deficiency, unspecified: Secondary | ICD-10-CM | POA: Insufficient documentation

## 2015-08-02 NOTE — Progress Notes (Signed)
.    Hematology oncology Clinic FOllowup Note  Date of service .07/30/2015     Patient Care Team: Gildardo Cranker, DO as PCP - General (Internal Medicine) Renella Cunas, MD as Consulting Physician (Cardiology) [name], MD as Consulting Physician (Ophthalmology) [name], MD as Consulting Physician (General Surgery)  CHIEF COMPLAINTS/PURPOSE OF CONSULTATION:  F/u for breast cancer.  DIAGNOSIS: left-sided presumed stage IA  (pT1c,pNx[cN0], cM0) invasive ductal carcinoma grade 2 out of 3, strongly ER +100%, strongly PR +100% and HER-2/neu negative. Given her high risk cardiac status lumpectomy had to be done under local anesthesia and sentinel lymph node biopsy was not possible. She has accompanying DCIS of intermediate grade present at margins. ECOG performance status is 3.  CURRENT TREATMENT: Arimidex 92m po daily  HISTORY OF PRESENTING ILLNESS: -please see my initial consultation for details of intial presentation  INTERVAL HISTORY  [name] here for her scheduled 3 month followup of her breast cancer.  She notes that she has been compliant with her Arimidex and has not had any prohibitive symptoms. Has not noted any new breast discomfort/skin changes or obvious lumps. Had a mammogram/tomosynthesis done on 07/29/2015 which shows residual suspicious calcification in the left breast postero-medial to the biopsy site. Has known residual DCIS.  No evidence of new malignancy.  No evidence of right breast malignancy. Blood sugars continue to remain uncontrolled. She has some chest pain shortness of breath and dizziness on 06/14/2015 which led to a CTA of the chest which showed no evidence of pulmonary embolism with bilateral clear lungs.  Diffuse coronary artery calcification.   MEDICAL HISTORY:  Past Medical History  Diagnosis Date  . Hyperlipidemia     takes Pravastatin daily  . Coronary artery disease     a. s/p IMI 2004 tx with BMS to RCA;  b. s/p Promus DES to RCA  2/12 (cath: LM 20-30%, pLAD 20-30%, mLAD 50%, RI 30%, pRCA 60%, mRCA 90% - tx with PCI);  c. myoview 1/07: EF 49%, inf scar, no isch  . Urinary incontinence     takes Linzess daily  . Insomnia   . Osteoarthritis   . Obstructive sleep apnea   . Polymyalgia rheumatica ([place]   . Arthritis   . Pulmonary emboli ([place] 9/13    felt to need lifelong anticoagulation  . Allergy     takes Mucinex daily as needed  . Gout, unspecified     takes Colchicine daily  . Dysuria   . Pain, chronic   . Osteoarthrosis, unspecified whether generalized or localized, lower leg   . Anemia, unspecified   . Coronary atherosclerosis of native coronary artery   . CHF (congestive heart failure) (HCC)     takes Furosemide daily  . Depression     takes Cymbalta daily  . Anxiety     takes Clonazepam daily as needed  . Hypertension     takes Imdur and Metoprolol daily  . GERD (gastroesophageal reflux disease)     takes Protonix daily  . Diabetes mellitus     insulin daily  . Type II or unspecified type diabetes mellitus without mention of complication, uncontrolled   . Myocardial infarction ([place] 2004  . History of blood clots about [address]ago    in legs-takes Coumadin   . Shortness of breath dyspnea     with exertion and has Albuterol inhaler prn  . Headache     occasionally  . Vertigo     hx of;was taking Meclizine if  needed  . Numbness   . Joint pain   . Joint swelling   . Osteoarthritis     SURGICAL HISTORY: Past Surgical History  Procedure Laterality Date  . Appendectomy    . Cardiac catheterization    . Gastric bypass  1977     reversed in 1979, [facility]  . Blood clots/legs and lungs  2013  . Mi with stent placement  2004  . Left heart catheterization with coronary angiogram N/A 06/29/2014    Procedure: LEFT HEART CATHETERIZATION WITH CORONARY ANGIOGRAM;  Surgeon: Troy Sine, MD;  Location[facility] CATH LAB;  Service: Cardiovascular;  Laterality: N/A;  . Coronary angioplasty  2  . Eye  surgery Bilateral     cataract   . Colonoscopy    . Abdominal hysterectomy      partial  . Breast lumpectomy with radioactive seed localization Left 11/05/2014    Procedure: LEFT BREAST LUMPECTOMY WITH RADIOACTIVE SEED LOCALIZATION;  Surgeon: [name], MD;  Location: [place];  Service: General;  Laterality: Left;  . Excision of skin tag Right 11/05/2014    Procedure: EXCISION OF RIGHT EYELID SKIN TAG;  Surgeon: [name], MD;  Location: [place];  Service: General;  Laterality: Right;    SOCIAL HISTORY: Social History   Social History  . Marital Status: Widowed    Spouse Name: N/A  . Number of Children: N/A  . Years of Education: N/A   Occupational History  . retired    Social History Main Topics  . Smoking status: Never Smoker   . Smokeless tobacco: Never Used  . Alcohol Use: No  . Drug Use: No  . Sexual Activity: Not Currently   Other Topics Concern  . Not on file   Social History Narrative    FAMILY HISTORY: Family History  Problem Relation Age of Onset  . Breast cancer Mother 43  . Heart disease Mother   . Throat cancer Father   . Hypertension Father   . Arthritis Father   . Diabetes Father   . Arthritis Sister   . Obesity Sister   . Diabetes Sister     ALLERGIES:  is allergic to sulfonamide derivatives and tramadol.  MEDICATIONS:  Current Outpatient Prescriptions  Medication Sig Dispense Refill  . albuterol (PROVENTIL HFA;VENTOLIN HFA) 108 (90 BASE) MCG/ACT inhaler Inhale 1-2 puffs into the lungs every 6 (six) hours as needed for wheezing or shortness of breath. 1 Inhaler 0  . anastrozole (ARIMIDEX) 1 MG tablet Take 1 tablet (1 mg total) by mouth daily. 90 tablet 4  . budesonide-formoterol (SYMBICORT) 160-4.5 MCG/ACT inhaler In 2 puffs twice daily to help wheezing (Patient taking differently: Inhale 2 puffs into the lungs 2 (two) times daily as needed (shortness of breath). ) 1 Inhaler 3  . Calcium Carbonate Antacid (TUMS PO) Take 3 tablets by  mouth daily as needed (acid reflux).    . cholecalciferol (VITAMIN D) 1000 units tablet Take 1,000 Units by mouth daily.    . ciprofloxacin (CIPRO) 500 MG tablet Take 1 tablet (500 mg total) by mouth every 12 (twelve) hours. 20 tablet 0  . clonazePAM (KLONOPIN) 1 MG tablet TAKE TWO TABLETS BY MOUTH AT BEDTIME AS NEEDED FOR SLEEP 180 tablet 0  . diphenhydrAMINE (BENADRYL) 25 MG tablet Take 50 mg by mouth daily as needed for itching. For itching    . DULoxetine (CYMBALTA) 60 MG capsule TAKE ONE CAPSULE BY MOUTH ONCE DAILY FOR ANXIETY 90 capsule 0  . febuxostat (ULORIC) 40  MG tablet Take 1 tablet (40 mg total) by mouth daily. 30 tablet 1  . Insulin Glargine (TOUJEO SOLOSTAR) 300 UNIT/ML SOPN Inject 45 Units into the skin 2 (two) times daily before a meal. Titrate up as needed. (Patient taking differently: Inject 60 Units into the skin 2 (two) times daily before a meal. Titrate up as needed.) 10 pen 11  . isosorbide mononitrate (IMDUR) 60 MG 24 hr tablet TAKE ONE TABLET BY MOUTH ONCE DAILY 90 tablet 0  . Lancets (ONETOUCH ULTRASOFT) lancets Use to test blood sugar Dx: E11.9 100 each 12  . meclizine (ANTIVERT) 25 MG tablet Take 1 tablet (25 mg total) by mouth 3 (three) times daily. (Patient taking differently: Take 25 mg by mouth 2 (two) times daily as needed for dizziness or nausea. ) 90 tablet 0  . metoprolol succinate (TOPROL-XL) 25 MG 24 hr tablet Take 1 tablet by mouth once every day for blood pressure 90 tablet 1  . metoprolol succinate (TOPROL-XL) 25 MG 24 hr tablet TAKE ONE TABLET BY MOUTH ONCE DAILY FOR BLOOD PRESSURE 90 tablet 1  . nitroGLYCERIN (NITROSTAT) 0.4 MG SL tablet Take one tablet under the tongue every 5 minutes as needed for chest pain (Patient taking differently: Place 0.4 mg under the tongue every 5 (five) minutes as needed for chest pain. ) 50 tablet 0  . oxyCODONE-acetaminophen (PERCOCET) 10-325 MG tablet Take 1 tablet by mouth every 6 (six) hours as needed for pain. 120 tablet 0    . pantoprazole (PROTONIX) 40 MG tablet TAKE ONE TABLET BY MOUTH ONCE DAILY 90 tablet 0  . pravastatin (PRAVACHOL) 20 MG tablet Take one tablet by mouth once daily for cholesterol 90 tablet 1  . pravastatin (PRAVACHOL) 20 MG tablet TAKE ONE TABLET BY MOUTH ONCE DAILY FOR CHOLESTEROL 30 tablet 0  . sertraline (ZOLOFT) 25 MG tablet Take 1 tablet (25 mg total) by mouth at bedtime. (Patient taking differently: Take 25 mg by mouth daily. ) 90 tablet 3  . temazepam (RESTORIL) 15 MG capsule Take 1 capsule (15 mg total) by mouth at bedtime as needed for sleep. Take with 7m capsule 90 capsule 1  . temazepam (RESTORIL) 30 MG capsule Take 1 capsule (30 mg total) by mouth at bedtime as needed. for sleep 90 capsule 0  . warfarin (COUMADIN) 5 MG tablet Take 1 tablet daily except 1 & 1/2 tablets on Mondays, Wednesdays and Fridays 45 tablet 4   No current facility-administered medications for this visit.    REVIEW OF SYSTEMS:   Review of systems as noted above Remaining 10 point review of systems negative except as noted above.  PHYSICAL EXAMINATION: ECOG PERFORMANCE STATUS: 3 - Symptomatic, >50% confined to bed  Filed Vitals:   07/30/15 1124  BP: 152/72  Pulse: 77  Temp: 97.7 F (36.5 C)  Resp: 18   Filed Weights   07/30/15 1124  Weight: 341 lb 4.8 oz (154.813 kg)   GENERAL: Pleasant African-American lady, alert, no distress and comfortable, obese. SKIN: skin color, texture, turgor are normal, no rashes or significant lesions EYES: normal, conjunctiva are pink and non-injected, sclera clear OROPHARYNX: no exudate, no erythema and lips, buccal mucosa, and tongue normal  NECK: supple, thyroid normal size, non-tender, without nodularity LYMPH:  no palpable lymphadenopathy in the cervical, axillary or inguinal LUNGS: clear to auscultation normal breathing effort. Tenderness to palpation over her anterior chest wall. HEART: regular rate & rhythm and no murmurs and no lower extremity  edema ABDOMEN:abdomen obese, soft, non-tender  and normal bowel sounds Musculoskeletal:no cyanosis of digits and no clubbing  PSYCH: alert & oriented x 3 with fluent speech NEURO: no focal motor/sensory deficits  LABORATORY DATA:  I have reviewed the data as listed  . CBC Latest Ref Rng 07/30/2015 06/14/2015 04/26/2015  WBC 3.9 - 10.3 10e3/uL 10.9(H) 13.8(H) 8.5  Hemoglobin 11.6 - 15.9 g/dL 9.9(L) 9.7(L) 10.4(L)  Hematocrit 34.8 - 46.6 % 32.1(L) 31.1(L) 33.4(L)  Platelets 145 - 400 10e3/uL 284 314 345   . CBC    Component Value Date/Time   WBC 10.9* 07/30/2015 1046   WBC 13.8* 06/14/2015 0150   WBC 8.2 07/08/2014 1318   RBC 3.80 07/30/2015 1046   RBC 3.66* 06/14/2015 0150   RBC 3.93 07/08/2014 1318   HGB 9.9* 07/30/2015 1046   HGB 9.7* 06/14/2015 0150   HCT 32.1* 07/30/2015 1046   HCT 31.1* 06/14/2015 0150   PLT 284 07/30/2015 1046   PLT 314 06/14/2015 0150   MCV 84.5 07/30/2015 1046   MCV 85.0 06/14/2015 0150   MCH 26.1 07/30/2015 1046   MCH 26.5 06/14/2015 0150   MCH 27.0 07/08/2014 1318   MCHC 30.8* 07/30/2015 1046   MCHC 31.2 06/14/2015 0150   MCHC 31.7 07/08/2014 1318   RDW 15.4* 07/30/2015 1046   RDW 14.6 06/14/2015 0150   RDW 15.6* 07/08/2014 1318   LYMPHSABS 3.0 07/30/2015 1046   LYMPHSABS 2.1 09/24/2014 1459   LYMPHSABS 2.3 07/08/2014 1318   MONOABS 0.6 07/30/2015 1046   MONOABS 0.5 09/24/2014 1459   EOSABS 0.3 07/30/2015 1046   EOSABS 0.4 09/24/2014 1459   BASOSABS 0.0 07/30/2015 1046   BASOSABS 0.0 09/24/2014 1459   BASOSABS 0.0 07/08/2014 1318    . CMP Latest Ref Rng 07/30/2015 06/14/2015 04/26/2015  Glucose 70 - 140 mg/dl 303(H) 550(H) 405(H)  BUN 7.0 - 26.0 mg/dL 17.8 19 18.4  Creatinine 0.6 - 1.1 mg/dL 1.3(H) 1.18(H) 1.4(H)  Sodium 136 - 145 mEq/L 139 133(L) 135(L)  Potassium 3.5 - 5.1 mEq/L 4.1 5.2(H) 4.5  Chloride 101 - 111 mmol/L - 95(L) -  CO2 22 - 29 mEq/L '27 26 26  ' Calcium 8.4 - 10.4 mg/dL 9.0 9.0 9.3  Total Protein 6.4 - 8.3 g/dL 7.9 -  8.3  Total Bilirubin 0.20 - 1.20 mg/dL 0.60 - 0.43  Alkaline Phos 40 - 150 U/L 79 - 80  AST 5 - 34 U/L 17 - 32  ALT 0 - 55 U/L 17 - 36      RADIOGRAPHIC STUDIES: I have personally reviewed the radiological images as listed and agreed with the findings in the report. CLINICAL DATA: Patient is status post surgery for breast carcinoma. Patient underwent a lumpectomy, but this was limited. Due to the patient's comorbidities she is unable to have general anesthesia for more extensive surgery. Margins were positive for DCIS. Patient is on Arimidex therapy.  EXAM: 2D DIGITAL DIAGNOSTIC BILATERAL MAMMOGRAM WITH CAD AND ADJUNCT TOMO  COMPARISON: Previous exam(s).  ACR Breast Density Category b: There are scattered areas of fibroglandular density.  FINDINGS: There are ductal type calcifications posterior and slightly medial to the lumpectomy site, stable from the most recent prior study. The lumpectomy site is in retroareolar region of the left breast associated architectural distortion reflecting postsurgical scarring. There are no discrete masses. There are no other areas of architectural distortion. There are no new suspicious calcifications.  There is no right breast mass or suspicious calcifications. The right breast architectural distortion.  Mammographic images were processed with CAD.  IMPRESSION: 1. Residual suspicious calcifications in the left breast posterior medial to biopsy site. Patient has known residual DCIS, which is being treated with Arimidex therapy. 2. No evidence of new malignancy. No right breast malignancy.  RECOMMENDATION: 1. Diagnostic mammography in 1 year per standard post lumpectomy protocol.  I have discussed the findings and recommendations with the patient. Results were also provided in writing at the conclusion of the visit. If applicable, a reminder letter will be sent to the patient regarding the next appointment.  BI-RADS  CATEGORY 6: Known biopsy-proven malignancy.   Electronically Signed  By: Lajean Manes M.D.  On: 07/29/2015 16:28   DUAL X-RAY ABSORPTIOMETRY (DXA) FOR BONE MINERAL DENSITY (02/04/2015)  FINDINGS: AP LUMBAR SPINE L1 through L4  Bone Mineral Density (BMD): 1.465 g/cm2  Young Adult T-Score: 2.2  Z-Score: 3.3  RIGHT FEMUR NECK  Bone Mineral Density (BMD): 0.741 g/cm2  Young Adult T-Score: -2.1  Z-Score: -1.0  ASSESSMENT: Patient's diagnostic category is LOW BONE MASS by WHO Criteria.  FRACTURE RISK: MODERATE   ASSESSMENT & PLAN:   [name] is a very wonderful 81 year old African-American female with multiple medical comorbidities as described above with   #1Left-sided presumed stage IA  (pT1c,pNx[cN0], cM0) invasive ductal carcinoma grade 2 out of 3, strongly ER +100%, strongly PR +100% and HER-2/neu negative. Given her high risk cardiac status lumpectomy had to be done under local anesthesia and sentinel lymph node biopsy was not possible. She has accompanying DCIS of intermediate grade present at margins. ECOG performance status is 3. Dexa scan "low bone mass" per WHO.  #2 significant coronary artery disease with stress test in February 2016 suggesting likely multivessel disease. Has uncontrolled diabetes, hypertension, dyslipidemia, untreated sleep apnea, coronary disease, severe arthritis all of which are significantly limiting her quality of life. #3 vitamin D deficiency - 25OH vitamin D level of 10, s/p high dose ergocalciferol re-placement. Now on vit D 2000 units daily.   Plan -no clear clinical indication for breast cancer recurrence/progression at this time. MMG shows calcifications consistent with residual DCIS but No evidence of cancer progression or new lesions. -continue Arimidex 22m po daily -on zoloft as per PCP which appears to be helping her hot flashes as well -continue vit D 2000IU daily - recheck on next visit. S/p  erogocalciferol. -atleast 1200-15069mpo calcium intake daily -MMG/tomosynthesis in 1 yr unless new symptoms in the interim. -continue f/u with PCP to monitor and management other multiple signiifcant medical co-morbids. -continue  vitamin D 2000 international units daily along with calcium 1000 mg daily. -rpt DEXA scan in 01/2017 for bone health monitoirng on Arimidex.  #4 postmenopausal vaginal bleeding -patient notes that she is following up with GYN for this and was told that she has an endometrial polyp. -management per Gyn  Return to care with Dr. KaIrene Limbon 3 months with CBC, CMP , 25OH vit D  Continue follow-up with your primary care physician Dr. CaEulas Postor other ongoing cares.  I appreciate the privilege of taking care of this wonderful person.  GaSullivan LoneD MSForemanematology/Oncology Physician CoAscension Ne Wisconsin St. [facility](Office):       [phone] cell):  [phone]):           [phone]

## 2015-08-04 ENCOUNTER — Ambulatory Visit (INDEPENDENT_AMBULATORY_CARE_PROVIDER_SITE_OTHER): Payer: PPO | Admitting: Internal Medicine

## 2015-08-04 ENCOUNTER — Encounter: Payer: Self-pay | Admitting: Internal Medicine

## 2015-08-04 VITALS — BP 130/90 | HR 86 | Temp 97.4°F | Resp 20 | Ht 67.0 in | Wt 341.0 lb

## 2015-08-04 DIAGNOSIS — I10 Essential (primary) hypertension: Secondary | ICD-10-CM

## 2015-08-04 DIAGNOSIS — E1122 Type 2 diabetes mellitus with diabetic chronic kidney disease: Secondary | ICD-10-CM | POA: Diagnosis not present

## 2015-08-04 DIAGNOSIS — E785 Hyperlipidemia, unspecified: Secondary | ICD-10-CM | POA: Diagnosis not present

## 2015-08-04 DIAGNOSIS — E1165 Type 2 diabetes mellitus with hyperglycemia: Secondary | ICD-10-CM

## 2015-08-04 DIAGNOSIS — Z794 Long term (current) use of insulin: Secondary | ICD-10-CM | POA: Diagnosis not present

## 2015-08-04 DIAGNOSIS — E1142 Type 2 diabetes mellitus with diabetic polyneuropathy: Secondary | ICD-10-CM | POA: Diagnosis not present

## 2015-08-04 DIAGNOSIS — J410 Simple chronic bronchitis: Secondary | ICD-10-CM

## 2015-08-04 DIAGNOSIS — F332 Major depressive disorder, recurrent severe without psychotic features: Secondary | ICD-10-CM | POA: Diagnosis not present

## 2015-08-04 DIAGNOSIS — G894 Chronic pain syndrome: Secondary | ICD-10-CM | POA: Diagnosis not present

## 2015-08-04 DIAGNOSIS — I2782 Chronic pulmonary embolism: Secondary | ICD-10-CM | POA: Diagnosis not present

## 2015-08-04 MED ORDER — INSULIN ASPART 100 UNIT/ML ~~LOC~~ SOLN: 25.0000 [IU] | Freq: Three times a day (TID) | SUBCUTANEOUS | Status: DC

## 2015-08-04 MED ORDER — OXYCODONE-ACETAMINOPHEN 10-325 MG PO TABS
1.0000 | ORAL_TABLET | Freq: Four times a day (QID) | ORAL | Status: DC | PRN
Start: 2015-08-04 — End: 2015-09-13

## 2015-08-04 MED ORDER — BUDESONIDE-FORMOTEROL FUMARATE 160-4.5 MCG/ACT IN AERO: 2.0000 | INHALATION_SPRAY | Freq: Two times a day (BID) | RESPIRATORY_TRACT | Status: DC | PRN

## 2015-08-04 MED ORDER — ALBUTEROL SULFATE HFA 108 (90 BASE) MCG/ACT IN AERS: 1.0000 | INHALATION_SPRAY | Freq: Four times a day (QID) | RESPIRATORY_TRACT | Status: DC | PRN

## 2015-08-04 NOTE — Progress Notes (Signed)
Patient ID: [name], female   DOB: 27-Jul-1934, 81 y.o.   MRN: [id number]    Location:    [name]   Place of Service:  OFFICE   Chief Complaint  Patient presents with  . Medical Management of Chronic Issues    2 month follow-up for routine visit  . OTHER    Patient says she is having swellin in her feet and hands plus they are painful and she has some pain in her breast    HPI:  81 yo female seen today for f/u  Gout - she is currently taking colcrys daily and insurance will no longer cover it. Has tried allopurinol but it was ineffective.she would like to try uloric.   CAD/hyperlipidemia/HTN - stable on imdur, lasix, metoprolol. Has not needed SL NTG  Chronic bronchitis - reports SOB and not able to move around as much. She uses HFA prn and symbicort   PE - on coumadin tx. No bleeding  Chronic pain syndrome/PMR/OA - stable overall on percocet. She reports increased pain in her chest wall that radiates from left breast to right. She is taking cymbalta also  Insomnia/depression/anxiety - not sleeping well even with medication restoril 46m. She gets about 4 hrs per night which is an improvement. She still takes klonopin and sertraline 212mqhs. Takes cymbalta also  GERD - stable on PPI  DM - BS at home 300s and as high as 500. No low BS reactions. She is making healthy food choices. She injects 60 units Toujeo BID. She no longer takes 25 units humalog with meals. She does not eat 3 full meals per day but is getting 2 per day. She c/o intermittent pain with numbness/tingling in hands and feet. She is unable to grasp and hold objects in right hand.   Breast CA - stable; takes arimidex daily  She is a poor historian due to memory issues. Hx obtained from daughter.   Past Medical History  Diagnosis Date  . Hyperlipidemia     takes Pravastatin daily  . Coronary artery disease     a. s/p IMI 2004 tx with BMS to RCA;  b. s/p Promus DES to RCA 2/12 (cath: LM 20-30%, pLAD 20-30%,  mLAD 50%, RI 30%, pRCA 60%, mRCA 90% - tx with PCI);  c. myoview 1/07: EF 49%, inf scar, no isch  . Urinary incontinence     takes Linzess daily  . Insomnia   . Osteoarthritis   . Obstructive sleep apnea   . Polymyalgia rheumatica ([facility]  . Arthritis   . Pulmonary emboli ([place]/13    felt to need lifelong anticoagulation  . Allergy     takes Mucinex daily as needed  . Gout, unspecified     takes Colchicine daily  . Dysuria   . Pain, chronic   . Osteoarthrosis, unspecified whether generalized or localized, lower leg   . Anemia, unspecified   . Coronary atherosclerosis of native coronary artery   . CHF (congestive heart failure) (HCC)     takes Furosemide daily  . Depression     takes Cymbalta daily  . Anxiety     takes Clonazepam daily as needed  . Hypertension     takes Imdur and Metoprolol daily  . GERD (gastroesophageal reflux disease)     takes Protonix daily  . Diabetes mellitus     insulin daily  . Type II or unspecified type diabetes mellitus without mention of complication, uncontrolled   . Myocardial infarction ([facility]  2004  . History of blood clots about 33yr ago    in legs-takes Coumadin   . Shortness of breath dyspnea     with exertion and has Albuterol inhaler prn  . Headache     occasionally  . Vertigo     hx of;was taking Meclizine if needed  . Numbness   . Joint pain   . Joint swelling   . Osteoarthritis     Past Surgical History  Procedure Laterality Date  . Appendectomy    . Cardiac catheterization    . [facility] bypass  1977     [facility] Blood clots/legs and lungs  2013  . Mi with stent placement  2004  . Left heart catheterization with coronary angiogram N/A 06/29/2014    Procedure: LEFT HEART CATHETERIZATION WITH CORONARY ANGIOGRAM;  Surgeon: TTroy Sine MD;  [facility];  Service: Cardiovascular;  Laterality: N/A;  . Coronary angioplasty  2  . Eye surgery Bilateral     cataract   . Colonoscopy    .  Abdominal hysterectomy      partial  . Breast lumpectomy with radioactive seed localization Left 11/05/2014    Procedure: LEFT BREAST LUMPECTOMY WITH RADIOACTIVE SEED LOCALIZATION;  Surgeon: DCoralie Keens MD;  [place] [place]  Service: General;  Laterality: Left;  . Excision of skin tag Right 11/05/2014    Procedure: EXCISION OF RIGHT EYELID SKIN TAG;  Surgeon: DCoralie Keens MD;  [place]  Service: General;  Laterality: Right;    Patient Care Team: MGildardo Cranker DO as PCP - General (Internal Medicine) TRenella Cunas MD as Consulting Physician (Cardiology) RClent Jacks MD as Consulting Physician (Ophthalmology) DCoralie Keens MD as Consulting Physician (General Surgery)  Social History   Social History  . Marital Status: Widowed    Spouse Name: N/A  . Number of Children: N/A  . Years of Education: N/A   Occupational History  . retired    Social History Main Topics  . Smoking status: Never Smoker   . Smokeless tobacco: Never Used  . Alcohol Use: No  . Drug Use: No  . Sexual Activity: Not Currently   Other Topics Concern  . Not on file   Social History Narrative     reports that she has never smoked. She has never used smokeless tobacco. She reports that she does not drink alcohol or use illicit drugs.  Allergies  Allergen Reactions  . Sulfonamide Derivatives Swelling    Mouth swelling  . Tramadol Nausea And Vomiting    Medications: Patient's Medications  New Prescriptions   No medications on file  Previous Medications   ALBUTEROL (PROVENTIL HFA;VENTOLIN HFA) 108 (90 BASE) MCG/ACT INHALER    Inhale 1-2 puffs into the lungs every 6 (six) hours as needed for wheezing or shortness of breath.   ANASTROZOLE (ARIMIDEX) 1 MG TABLET    Take 1 tablet (1 mg total) by mouth daily.   BUDESONIDE-FORMOTEROL (SYMBICORT) 160-4.5 MCG/ACT INHALER    In 2 puffs twice daily to help wheezing   CALCIUM CARBONATE ANTACID (TUMS PO)    Take 3 tablets by mouth daily as  needed (acid reflux).   CHOLECALCIFEROL (VITAMIN D) 1000 UNITS TABLET    Take 1,000 Units by mouth daily.   CLONAZEPAM (KLONOPIN) 1 MG TABLET    TAKE TWO TABLETS BY MOUTH AT BEDTIME AS NEEDED FOR SLEEP   DIPHENHYDRAMINE (BENADRYL) 25 MG TABLET    Take 50 mg by mouth  daily as needed for itching. For itching   DULOXETINE (CYMBALTA) 60 MG CAPSULE    TAKE ONE CAPSULE BY MOUTH ONCE DAILY FOR ANXIETY   FEBUXOSTAT (ULORIC) 40 MG TABLET    Take 1 tablet (40 mg total) by mouth daily.   INSULIN GLARGINE (TOUJEO SOLOSTAR) 300 UNIT/ML SOPN    Inject 45 Units into the skin 2 (two) times daily before a meal. Titrate up as needed.   ISOSORBIDE MONONITRATE (IMDUR) 60 MG 24 HR TABLET    TAKE ONE TABLET BY MOUTH ONCE DAILY   LANCETS (ONETOUCH ULTRASOFT) LANCETS    Use to test blood sugar Dx: E11.9   MECLIZINE (ANTIVERT) 25 MG TABLET    Take 1 tablet (25 mg total) by mouth 3 (three) times daily.   METOPROLOL SUCCINATE (TOPROL-XL) 25 MG 24 HR TABLET    TAKE ONE TABLET BY MOUTH ONCE DAILY FOR BLOOD PRESSURE   NITROGLYCERIN (NITROSTAT) 0.4 MG SL TABLET    Take one tablet under the tongue every 5 minutes as needed for chest pain   OXYCODONE-ACETAMINOPHEN (PERCOCET) 10-325 MG TABLET    Take 1 tablet by mouth every 6 (six) hours as needed for pain.   PANTOPRAZOLE (PROTONIX) 40 MG TABLET    TAKE ONE TABLET BY MOUTH ONCE DAILY   PRAVASTATIN (PRAVACHOL) 20 MG TABLET    TAKE ONE TABLET BY MOUTH ONCE DAILY FOR CHOLESTEROL   SERTRALINE (ZOLOFT) 25 MG TABLET    Take 1 tablet (25 mg total) by mouth at bedtime.   TEMAZEPAM (RESTORIL) 30 MG CAPSULE    Take 1 capsule (30 mg total) by mouth at bedtime as needed. for sleep   WARFARIN (COUMADIN) 5 MG TABLET    Take 1 tablet daily except 1 & 1/2 tablets on Mondays, Wednesdays and Fridays  Modified Medications   No medications on file  Discontinued Medications   CIPROFLOXACIN (CIPRO) 500 MG TABLET    Take 1 tablet (500 mg total) by mouth every 12 (twelve) hours.   METOPROLOL SUCCINATE  (TOPROL-XL) 25 MG 24 HR TABLET    Take 1 tablet by mouth once every day for blood pressure   PRAVASTATIN (PRAVACHOL) 20 MG TABLET    Take one tablet by mouth once daily for cholesterol   TEMAZEPAM (RESTORIL) 15 MG CAPSULE    Take 1 capsule (15 mg total) by mouth at bedtime as needed for sleep. Take with 60m capsule    Review of Systems  Unable to perform ROS: Other    Filed Vitals:   08/04/15 1341  BP: 130/90  Pulse: 86  Temp: 97.4 F (36.3 C)  TempSrc: Oral  Resp: 20  Height: '5\' 7"'  (1.702 m)  Weight: 341 lb (154.677 kg)  SpO2: 91%   Body mass index is 53.4 kg/(m^2).  Physical Exam  Constitutional: She appears well-developed and well-nourished.  HENT:  Mouth/Throat: Oropharynx is clear and moist. No oropharyngeal exudate.  Eyes: Pupils are equal, round, and reactive to light. No scleral icterus.  Neck: Neck supple. Carotid bruit is not present. No tracheal deviation present. No thyromegaly present.  Cardiovascular: Normal rate, regular rhythm and intact distal pulses.  Exam reveals no gallop and no friction rub.   Murmur (1/6 SEM) heard. +1 pitting LE edema b/l. no calf TTP.   Pulmonary/Chest: Effort normal and breath sounds normal. No stridor. No respiratory distress. She has no wheezes. She has no rales. She exhibits tenderness (multiple ACW TP).  Abdominal: Soft. Bowel sounds are normal. She exhibits no distension and no  mass. There is no hepatomegaly. There is tenderness (epigastric). There is no rebound and no guarding.  Musculoskeletal: She exhibits edema and tenderness.  Lymphadenopathy:    She has no cervical adenopathy.  Neurological: She is alert.  Skin: Skin is warm and dry. No rash noted.  Psychiatric: She has a normal mood and affect. Her behavior is normal.     Labs reviewed: Abstract on 07/30/2015  Component Date Value Ref Range Status  . [facility]: no evidence of new malignancy. No right breast malignancy   Final  Appointment  on 07/30/2015  Component Date Value Ref Range Status  . WBC 07/30/2015 10.9* 3.9 - 10.3 10e3/uL Final  . NEUT# 07/30/2015 7.1* 1.5 - 6.5 10e3/uL Final  . HGB 07/30/2015 9.9* 11.6 - 15.9 g/dL Final  . HCT 07/30/2015 32.1* 34.8 - 46.6 % Final  . Platelets 07/30/2015 284  145 - 400 10e3/uL Final  . MCV 07/30/2015 84.5  79.5 - 101.0 fL Final  . MCH 07/30/2015 26.1  25.1 - 34.0 pg Final  . MCHC 07/30/2015 30.8* 31.5 - 36.0 g/dL Final  . RBC 07/30/2015 3.80  3.70 - 5.45 10e6/uL Final  . RDW 07/30/2015 15.4* 11.2 - 14.5 % Final  . lymph# 07/30/2015 3.0  0.9 - 3.3 10e3/uL Final  . MONO# 07/30/2015 0.6  0.1 - 0.9 10e3/uL Final  . Eosinophils Absolute 07/30/2015 0.3  0.0 - 0.5 10e3/uL Final  . Basophils Absolute 07/30/2015 0.0  0.0 - 0.1 10e3/uL Final  . NEUT% 07/30/2015 64.5  38.4 - 76.8 % Final  . LYMPH% 07/30/2015 27.6  14.0 - 49.7 % Final  . MONO% 07/30/2015 5.4  0.0 - 14.0 % Final  . EOS% 07/30/2015 2.3  0.0 - 7.0 % Final  . BASO% 07/30/2015 0.2  0.0 - 2.0 % Final  . Retic % 07/30/2015 1.68  0.70 - 2.10 % Final  . Retic Ct Abs 07/30/2015 63.84  33.70 - 90.70 10e3/uL Final  . Immature Retic Fract 07/30/2015 16.30* 1.60 - 10.00 % Final  . Sodium 07/30/2015 139  136 - 145 mEq/L Final  . Potassium 07/30/2015 4.1  3.5 - 5.1 mEq/L Final  . Chloride 07/30/2015 103  98 - 109 mEq/L Final  . CO2 07/30/2015 27  22 - 29 mEq/L Final  . Glucose 07/30/2015 303* 70 - 140 mg/dl Final   Glucose reference range is for nonfasting patients. Fasting glucose reference range is 70- 100.  [name] BUN 07/30/2015 17.8  7.0 - 26.0 mg/dL Final  . Creatinine 07/30/2015 1.3* 0.6 - 1.1 mg/dL Final  . Total Bilirubin 07/30/2015 0.60  0.20 - 1.20 mg/dL Final  . Alkaline Phosphatase 07/30/2015 79  40 - 150 U/L Final  . AST 07/30/2015 17  5 - 34 U/L Final  . ALT 07/30/2015 17  0 - 55 U/L Final  . Total Protein 07/30/2015 7.9  6.4 - 8.3 g/dL Final  . Albumin 07/30/2015 2.9* 3.5 - 5.0 g/dL Final  . Calcium 07/30/2015 9.0  8.4  - 10.4 mg/dL Final  . Anion Gap 07/30/2015 9  3 - 11 mEq/L Final  . EGFR 07/30/2015 46* >90 ml/min/1.73 m2 Final   eGFR is calculated using the CKD-EPI Creatinine Equation (2009)  Admission on 06/14/2015, Discharged on 06/14/2015  Component Date Value Ref Range Status  . Glucose-Capillary 06/13/2015 475* 65 - 99 mg/dL Final  . Sodium 06/14/2015 133* 135 - 145 mmol/L Final  . Potassium 06/14/2015 5.2* 3.5 - 5.1 mmol/L Final  . Chloride  06/14/2015 95* 101 - 111 mmol/L Final  . CO2 06/14/2015 26  22 - 32 mmol/L Final  . Glucose, Bld 06/14/2015 550* 65 - 99 mg/dL Final  . BUN 06/14/2015 19  6 - 20 mg/dL Final  . Creatinine, Ser 06/14/2015 1.18* 0.44 - 1.00 mg/dL Final  . Calcium 06/14/2015 9.0  8.9 - 10.3 mg/dL Final  . GFR calc non Af Amer 06/14/2015 43* >60 mL/min Final  . GFR calc Af Amer 06/14/2015 50* >60 mL/min Final   Comment: (NOTE) The eGFR has been calculated using the CKD EPI equation. This calculation has not been validated in all clinical situations. eGFR's persistently <60 mL/min signify possible Chronic Kidney Disease.   . Anion gap 06/14/2015 12  5 - 15 Final  . WBC 06/14/2015 13.8* 4.0 - 10.5 K/uL Final  . RBC 06/14/2015 3.66* 3.87 - 5.11 MIL/uL Final  . Hemoglobin 06/14/2015 9.7* 12.0 - 15.0 g/dL Final  . HCT 06/14/2015 31.1* 36.0 - 46.0 % Final  . MCV 06/14/2015 85.0  78.0 - 100.0 fL Final  . MCH 06/14/2015 26.5  26.0 - 34.0 pg Final  . MCHC 06/14/2015 31.2  30.0 - 36.0 g/dL Final  . RDW 06/14/2015 14.6  11.5 - 15.5 % Final  . Platelets 06/14/2015 314  150 - 400 K/uL Final  . Troponin I 06/14/2015 0.03  <0.031 ng/mL Final   Comment:        NO INDICATION OF MYOCARDIAL INJURY.   . Prothrombin Time 06/14/2015 20.1* 11.6 - 15.2 seconds Final  . INR 06/14/2015 1.71* 0.00 - 1.49 Final  . B Natriuretic Peptide 06/14/2015 55.4  0.0 - 100.0 pg/mL Final  . Glucose-Capillary 06/14/2015 423* 65 - 99 mg/dL Final  . Glucose-Capillary 06/14/2015 358* 65 - 99 mg/dL Final    Appointment on 06/01/2015  Component Date Value Ref Range Status  . Urine Culture, Routine 06/01/2015 Final report*  Final  . Urine Culture result 1 06/01/2015 Klebsiella pneumoniae*  Final   Greater than 100,000 colony forming units per mL  . ANTIMICROBIAL SUSCEPTIBILITY 06/01/2015 Comment   Final   Comment:       ** S = Susceptible; I = Intermediate; R = Resistant **                    P = Positive; N = Negative             MICS are expressed in micrograms per mL    Antibiotic                 RSLT#1    RSLT#2    RSLT#3    RSLT#4 Amoxicillin/Clavulanic Acid    S Ampicillin                     R Cefepime                       S Ceftriaxone                    S Cefuroxime                     S Cephalothin                    S Ciprofloxacin                  S Ertapenem  S Gentamicin                     S Imipenem                       S Levofloxacin                   S Nitrofurantoin                 S Piperacillin                   S Tetracycline                   S Tobramycin                     S Trimethoprim/Sulfa             S     Mm Diag Breast Tomo Bilateral  07/29/2015  CLINICAL DATA:  Patient is status post surgery for breast carcinoma. Patient underwent a lumpectomy, but this was limited. Due to the patient's comorbidities she is unable to have general anesthesia for more extensive surgery. Margins were positive for DCIS. Patient is on Arimidex therapy. EXAM: 2D DIGITAL DIAGNOSTIC BILATERAL MAMMOGRAM WITH CAD AND ADJUNCT TOMO COMPARISON:  Previous exam(s). ACR Breast Density Category b: There are scattered areas of fibroglandular density. FINDINGS: There are ductal type calcifications posterior and slightly medial to the lumpectomy site, stable from the most recent prior study. The lumpectomy site is in retroareolar region of the left breast associated architectural distortion reflecting postsurgical scarring. There are no discrete masses. There are no other  areas of architectural distortion. There are no new suspicious calcifications. There is no right breast mass or suspicious calcifications. The right breast architectural distortion. Mammographic images were processed with CAD. IMPRESSION: 1. Residual suspicious calcifications in the left breast posterior medial to biopsy site. Patient has known residual DCIS, which is being treated with Arimidex therapy. 2. No evidence of new malignancy.  No right breast malignancy. RECOMMENDATION: 1. Diagnostic mammography in 1 year per standard post lumpectomy protocol. I have discussed the findings and recommendations with the patient. Results were also provided in writing at the conclusion of the visit. If applicable, a reminder letter will be sent to the patient regarding the next appointment. BI-RADS CATEGORY  6: Known biopsy-proven malignancy. Electronically Signed   By: Lajean Manes M.D.   On: 07/29/2015 16:28     Assessment/Plan   ICD-9-CM ICD-10-CM   1. Simple chronic bronchitis (HCC) 491.0 J41.0 budesonide-formoterol (SYMBICORT) 160-4.5 MCG/ACT inhaler     albuterol (PROVENTIL HFA;VENTOLIN HFA) 108 (90 Base) MCG/ACT inhaler  2. Uncontrolled type 2 diabetes mellitus with chronic kidney disease, with long-term current use of insulin, unspecified CKD stage (HCC) 250.42 E11.22 insulin aspart (NOVOLOG) 100 UNIT/ML injection   585.9 Z79.4 Hemoglobin A1c   V58.67 E11.65   3. Severe episode of recurrent major depressive disorder, without psychotic features ([place]) 296.33 F33.2   4. Chronic pain syndrome 338.4 G89.4 oxyCODONE-acetaminophen (PERCOCET) 10-325 MG tablet  5. Essential hypertension 401.9 I10   6. Other chronic pulmonary embolism (HCC)  I27.82   7. Diabetic peripheral neuropathy (HCC) 250.60 E11.42    357.2    8. Hyperlipidemia 272.4 E78.5    Continue current medications as ordered  Will call with lab results  Take novolog insulin 34mn prior to meals  Follow up with specialists as  scheduled  Follow up in 2 mos for routine visit    Joyia Riehle S. Perlie Gold  [facility] [address] [place], [place] [address] [phone] Cell (Monday-Friday 8 AM - 5 PM) [phone] After 5 PM and follow prompts

## 2015-08-04 NOTE — Patient Instructions (Signed)
Continue current medications as ordered  Will call with lab results  Take novolog insulin 6min prior to meals  Follow up with specialists as scheduled  Follow up in 2 mos for routine visit

## 2015-08-05 LAB — HEMOGLOBIN A1C
Est. average glucose Bld gHb Est-mCnc: 295 mg/dL
Hgb A1c MFr Bld: 11.9 % — ABNORMAL HIGH (ref 4.8–5.6)

## 2015-08-09 ENCOUNTER — Encounter: Payer: Self-pay | Admitting: Pharmacotherapy

## 2015-08-09 ENCOUNTER — Ambulatory Visit (INDEPENDENT_AMBULATORY_CARE_PROVIDER_SITE_OTHER): Payer: PPO | Admitting: Pharmacotherapy

## 2015-08-09 VITALS — BP 142/80 | HR 87 | Temp 98.0°F | Wt 343.0 lb

## 2015-08-09 DIAGNOSIS — I2782 Chronic pulmonary embolism: Secondary | ICD-10-CM | POA: Diagnosis not present

## 2015-08-09 DIAGNOSIS — Z7901 Long term (current) use of anticoagulants: Secondary | ICD-10-CM | POA: Diagnosis not present

## 2015-08-09 LAB — POCT INR: INR: 1.8

## 2015-08-09 MED ORDER — WARFARIN SODIUM 5 MG PO TABS: ORAL_TABLET | ORAL | Status: DC

## 2015-08-09 NOTE — Progress Notes (Signed)
   Subjective:    Patient ID: [name], female    DOB: [birth year], 81 y.o.   MRN: [id number]  HPI  Last INR was low at 1.71 Current coumadin dose is 5mg  QD except 7.5mg  MWF Denies missed doses. Has chronic SOB.  No cardiac CP. Denies unusual bleeding or bruising. Did increase vitamin K intake.  Review of Systems  HENT: Negative for nosebleeds.   Respiratory: Positive for shortness of breath.   Cardiovascular: Negative for chest pain.  Gastrointestinal: Negative for blood in stool and anal bleeding.  Genitourinary: Negative for hematuria.  Hematological: Does not bruise/bleed easily.       Objective:   Physical Exam  Constitutional: She is oriented to person, place, and time. She appears well-developed and well-nourished.  HENT:  Right Ear: External ear normal.  Left Ear: External ear normal.  Cardiovascular: Normal rate, regular rhythm and normal heart sounds.   Pulmonary/Chest: Effort normal and breath sounds normal.  Neurological: She is alert and oriented to person, place, and time.  Skin: Skin is warm and dry.  Psychiatric: She has a normal mood and affect. Her behavior is normal. Judgment and thought content normal.  Vitals reviewed.   BP:  142/80  HR: 87  Wt: 343 INR 1.8        Assessment & Plan:  1.  INR below goal 2-3 2.  Increase Coumadin 7.5mg  QD except 5mg  Su/T/Th 3.  RTC in 1 month

## 2015-08-09 NOTE — Patient Instructions (Signed)
INR 1.8 Increase Coumadin 7.5mg  (1 & 1/2 tablets) daily except 5mg  (1 tablet) on Sundays, Tuesdays, Thursdays

## 2015-08-30 ENCOUNTER — Other Ambulatory Visit: Payer: Self-pay | Admitting: Internal Medicine

## 2015-09-06 ENCOUNTER — Ambulatory Visit (INDEPENDENT_AMBULATORY_CARE_PROVIDER_SITE_OTHER): Payer: PPO | Admitting: Pharmacotherapy

## 2015-09-06 ENCOUNTER — Encounter: Payer: Self-pay | Admitting: Pharmacotherapy

## 2015-09-06 VITALS — BP 148/90 | HR 81 | Temp 97.8°F | Resp 20 | Ht 67.0 in | Wt 344.4 lb

## 2015-09-06 DIAGNOSIS — Z7901 Long term (current) use of anticoagulants: Secondary | ICD-10-CM | POA: Diagnosis not present

## 2015-09-06 DIAGNOSIS — L299 Pruritus, unspecified: Secondary | ICD-10-CM | POA: Diagnosis not present

## 2015-09-06 DIAGNOSIS — I2782 Chronic pulmonary embolism: Secondary | ICD-10-CM | POA: Diagnosis not present

## 2015-09-06 LAB — POCT INR: INR: 2

## 2015-09-06 MED ORDER — TRIAMCINOLONE ACETONIDE 0.1 % EX CREA: 1.0000 "application " | TOPICAL_CREAM | Freq: Two times a day (BID) | CUTANEOUS | Status: DC

## 2015-09-06 NOTE — Progress Notes (Signed)
   Subjective:    Patient ID: [name], female    DOB: 01-16-1935, 81 y.o.   MRN: [id number]  HPI Last INR was low and Coumadin was increased to 7.5mg  daily except 5mg  Sundays, Tuesdays, Thursdays. Denies missed doses. Denies unusual bleeding or bruising. Denies CP, falls Consistent with vitamin K intake.  Complains of itching.   Review of Systems  HENT: Negative for nosebleeds.   Cardiovascular: Negative for chest pain.  Gastrointestinal: Negative for blood in stool and anal bleeding.  Genitourinary: Negative for hematuria.  Skin:       itching  Hematological: Does not bruise/bleed easily.       Objective:   Physical Exam  Constitutional: She is oriented to person, place, and time. She appears well-developed and well-nourished.  HENT:  Right Ear: External ear normal.  Left Ear: External ear normal.  Cardiovascular: Normal rate, regular rhythm and normal heart sounds.   Pulmonary/Chest: Effort normal and breath sounds normal.  Neurological: She is alert and oriented to person, place, and time.  Skin: Skin is warm and dry. Rash noted.  Psychiatric: She has a normal mood and affect. Her behavior is normal. Judgment and thought content normal.  Vitals reviewed.     BP: 148/90  HR: 81  Wt: 344 INR 2.0     Assessment & Plan:  1.  INR at goal 2-3 2.  Continue Coumadin 7.5mg  QD except 5mg  Su/T/Th 3.  Try TAC 0.1% cream to leg BID until clear. 4..  RTC 1 month

## 2015-09-06 NOTE — Patient Instructions (Signed)
INR 2.0 Continue Coumadin 7.5mg  daily except 1 tablet on Sundays, Tuesdays, and Thursdays

## 2015-09-13 ENCOUNTER — Other Ambulatory Visit: Payer: Self-pay | Admitting: *Deleted

## 2015-09-13 DIAGNOSIS — G894 Chronic pain syndrome: Secondary | ICD-10-CM

## 2015-09-13 MED ORDER — OXYCODONE-ACETAMINOPHEN 10-325 MG PO TABS: 1.0000 | ORAL_TABLET | Freq: Four times a day (QID) | ORAL | Status: DC | PRN

## 2015-09-13 NOTE — Telephone Encounter (Signed)
Patient requested and will pick up 

## 2015-09-24 ENCOUNTER — Encounter: Payer: Self-pay | Admitting: Internal Medicine

## 2015-09-24 ENCOUNTER — Ambulatory Visit (INDEPENDENT_AMBULATORY_CARE_PROVIDER_SITE_OTHER): Payer: PPO | Admitting: Internal Medicine

## 2015-09-24 VITALS — BP 142/70 | HR 85 | Temp 97.9°F | Wt 344.0 lb

## 2015-09-24 DIAGNOSIS — R8290 Unspecified abnormal findings in urine: Secondary | ICD-10-CM | POA: Diagnosis not present

## 2015-09-24 DIAGNOSIS — E1165 Type 2 diabetes mellitus with hyperglycemia: Secondary | ICD-10-CM | POA: Diagnosis not present

## 2015-09-24 DIAGNOSIS — E1122 Type 2 diabetes mellitus with diabetic chronic kidney disease: Secondary | ICD-10-CM

## 2015-09-24 DIAGNOSIS — N760 Acute vaginitis: Secondary | ICD-10-CM

## 2015-09-24 DIAGNOSIS — Z794 Long term (current) use of insulin: Secondary | ICD-10-CM | POA: Diagnosis not present

## 2015-09-24 DIAGNOSIS — J410 Simple chronic bronchitis: Secondary | ICD-10-CM

## 2015-09-24 LAB — POCT URINALYSIS DIPSTICK
Bilirubin, UA: NEGATIVE
Glucose, UA: NEGATIVE
Ketones, UA: NEGATIVE
Nitrite, UA: NEGATIVE
Spec Grav, UA: 1.015
Urobilinogen, UA: 0.2
pH, UA: 5

## 2015-09-24 MED ORDER — FLUCONAZOLE 150 MG PO TABS: 150.0000 mg | ORAL_TABLET | Freq: Every day | ORAL | Status: DC

## 2015-09-24 MED ORDER — ALBUTEROL SULFATE HFA 108 (90 BASE) MCG/ACT IN AERS: 1.0000 | INHALATION_SPRAY | Freq: Four times a day (QID) | RESPIRATORY_TRACT | Status: DC | PRN

## 2015-09-24 MED ORDER — SACCHAROMYCES BOULARDII 250 MG PO CAPS: 250.0000 mg | ORAL_CAPSULE | Freq: Two times a day (BID) | ORAL | Status: DC

## 2015-09-24 MED ORDER — BUDESONIDE-FORMOTEROL FUMARATE 160-4.5 MCG/ACT IN AERO: 2.0000 | INHALATION_SPRAY | Freq: Two times a day (BID) | RESPIRATORY_TRACT | Status: DC | PRN

## 2015-09-24 MED ORDER — CIPROFLOXACIN HCL 250 MG PO TABS: 250.0000 mg | ORAL_TABLET | Freq: Two times a day (BID) | ORAL | Status: DC

## 2015-09-24 NOTE — Progress Notes (Signed)
Location:    [name]   Place of Service:   OFFICE  Chief Complaint  Patient presents with  . Acute Visit    yeast or bladder infection    HPI:  81 yo female seen today for vaginal itching x 2 weeks. She reports  She had brown d/c. Now she has 1 week hx fouls smelling urine and urine is brown colored. No dysuria, hematuria, urinary frequency/urgency. 1 episode of difficulty starting urine stream but none since. No abdominal pain, N/V, f/c.   Chronic bronchitis - reports SOB and not able to move around as much. She uses HFA prn and symbicort prn. Needs RF  PE - on coumadin tx. No bleeding. INR 2 on 09/06/15  DM - BS at home 300s and as high as 500. No low BS reactions. She is making healthy food choices. She injects 60 units Toujeo BID. She no longer takes 25 units humalog with meals. She does not eat 3 full meals per day but is getting 2 per day. She c/o intermittent pain with numbness/tingling in hands and feet. She is unable to grasp and hold objects in right hand.   Breast CA - stable; takes arimidex daily. Followed by oncology  She is a poor historian due to memory issues. Hx obtained from daughter.    Past Medical History  Diagnosis Date  . Hyperlipidemia     takes Pravastatin daily  . Coronary artery disease     a. s/p IMI 2004 tx with BMS to RCA;  b. s/p Promus DES to RCA 2/12 (cath: LM 20-30%, pLAD 20-30%, mLAD 50%, RI 30%, pRCA 60%, mRCA 90% - tx with PCI);  c. myoview 1/07: EF 49%, inf scar, no isch  . Urinary incontinence     takes Linzess daily  . Insomnia   . Osteoarthritis   . Obstructive sleep apnea   . Polymyalgia rheumatica ([place])   . Arthritis   . Pulmonary emboli ([place]) 9/13    felt to need lifelong anticoagulation  . Allergy     takes Mucinex daily as needed  . Gout, unspecified     takes Colchicine daily  . Dysuria   . Pain, chronic   . Osteoarthrosis, unspecified whether generalized or localized, lower leg   . Anemia, unspecified   . Coronary  atherosclerosis of native coronary artery   . CHF (congestive heart failure) (HCC)     takes Furosemide daily  . Depression     takes Cymbalta daily  . Anxiety     takes Clonazepam daily as needed  . Hypertension     takes Imdur and Metoprolol daily  . GERD (gastroesophageal reflux disease)     takes Protonix daily  . Diabetes mellitus     insulin daily  . Type II or unspecified type diabetes mellitus without mention of complication, uncontrolled   . Myocardial infarction ([place]) 2004  . History of blood clots about 69yr ago    in legs-takes Coumadin   . Shortness of breath dyspnea     with exertion and has Albuterol inhaler prn  . Headache     occasionally  . Vertigo     hx of;was taking Meclizine if needed  . Numbness   . Joint pain   . Joint swelling   . Osteoarthritis     Past Surgical History  Procedure Laterality Date  . Appendectomy    . Cardiac catheterization    . Gastric bypass  1977     reversed  in 1979, [facility]  . Blood clots/legs and lungs  2013  . Mi with stent placement  2004  . Left heart catheterization with coronary angiogram N/A 06/29/2014    Procedure: LEFT HEART CATHETERIZATION WITH CORONARY ANGIOGRAM;  Surgeon: Troy Sine, MD;  Location: [facility] CATH LAB;  Service: Cardiovascular;  Laterality: N/A;  . Coronary angioplasty  2  . Eye surgery Bilateral     cataract   . Colonoscopy    . Abdominal hysterectomy      partial  . Breast lumpectomy with radioactive seed localization Left 11/05/2014    Procedure: LEFT BREAST LUMPECTOMY WITH RADIOACTIVE SEED LOCALIZATION;  Surgeon: [name], MD;  Location: [place];  Service: General;  Laterality: Left;  . Excision of skin tag Right 11/05/2014    Procedure: EXCISION OF RIGHT EYELID SKIN TAG;  Surgeon: [name], MD;  Location: [place];  Service: General;  Laterality: Right;    Patient Care Team: Gildardo Cranker, DO as PCP - General (Internal Medicine) Renella Cunas, MD as Consulting Physician  (Cardiology) [name], MD as Consulting Physician (Ophthalmology) [name], MD as Consulting Physician (General Surgery)  Social History   Social History  . Marital Status: Widowed    Spouse Name: N/A  . Number of Children: N/A  . Years of Education: N/A   Occupational History  . retired    Social History Main Topics  . Smoking status: Never Smoker   . Smokeless tobacco: Never Used  . Alcohol Use: No  . Drug Use: No  . Sexual Activity: Not Currently   Other Topics Concern  . Not on file   Social History Narrative     reports that she has never smoked. She has never used smokeless tobacco. She reports that she does not drink alcohol or use illicit drugs.  Allergies  Allergen Reactions  . Sulfonamide Derivatives Swelling    Mouth swelling  . Tramadol Nausea And Vomiting    Medications: Patient's Medications  New Prescriptions   No medications on file  Previous Medications   ALBUTEROL (PROVENTIL HFA;VENTOLIN HFA) 108 (90 BASE) MCG/ACT INHALER    Inhale 1-2 puffs into the lungs every 6 (six) hours as needed for wheezing or shortness of breath.   ANASTROZOLE (ARIMIDEX) 1 MG TABLET    Take 1 tablet (1 mg total) by mouth daily.   BUDESONIDE-FORMOTEROL (SYMBICORT) 160-4.5 MCG/ACT INHALER    Inhale 2 puffs into the lungs 2 (two) times daily as needed (shortness of breath).   CALCIUM CARBONATE ANTACID (TUMS PO)    Take 3 tablets by mouth daily as needed (acid reflux).   CHOLECALCIFEROL (VITAMIN D) 1000 UNITS TABLET    Take 1,000 Units by mouth daily.   CLONAZEPAM (KLONOPIN) 1 MG TABLET    TAKE TWO TABLETS BY MOUTH AT BEDTIME AS NEEDED FOR SLEEP   DIPHENHYDRAMINE (BENADRYL) 25 MG TABLET    Take 50 mg by mouth daily as needed for itching. For itching   DULOXETINE (CYMBALTA) 60 MG CAPSULE    TAKE ONE CAPSULE BY MOUTH ONCE DAILY FOR ANXIETY   INSULIN ASPART (NOVOLOG) 100 UNIT/ML INJECTION    Inject 25 Units into the skin 3 (three) times daily with meals.   INSULIN  GLARGINE (TOUJEO SOLOSTAR) 300 UNIT/ML SOPN    Inject 60 Units into the skin 2 (two) times daily.   ISOSORBIDE MONONITRATE (IMDUR) 60 MG 24 HR TABLET    TAKE ONE TABLET BY MOUTH ONCE DAILY   LANCETS (ONETOUCH ULTRASOFT) LANCETS  Use to test blood sugar Dx: E11.9   MECLIZINE (ANTIVERT) 25 MG TABLET    Take 1 tablet (25 mg total) by mouth 3 (three) times daily.   METOPROLOL SUCCINATE (TOPROL-XL) 25 MG 24 HR TABLET    TAKE ONE TABLET BY MOUTH ONCE DAILY FOR BLOOD PRESSURE   NITROGLYCERIN (NITROSTAT) 0.4 MG SL TABLET    Take one tablet under the tongue every 5 minutes as needed for chest pain   OXYCODONE-ACETAMINOPHEN (PERCOCET) 10-325 MG TABLET    Take 1 tablet by mouth every 6 (six) hours as needed for pain.   PANTOPRAZOLE (PROTONIX) 40 MG TABLET    TAKE ONE TABLET BY MOUTH ONCE DAILY   PRAVASTATIN (PRAVACHOL) 20 MG TABLET    TAKE ONE TABLET BY MOUTH ONCE DAILY FOR CHOLESTEROL   SERTRALINE (ZOLOFT) 25 MG TABLET    Take 1 tablet (25 mg total) by mouth at bedtime.   TEMAZEPAM (RESTORIL) 30 MG CAPSULE    Take 1 capsule (30 mg total) by mouth at bedtime as needed. for sleep   TRIAMCINOLONE CREAM (KENALOG) 0.1 %    Apply 1 application topically 2 (two) times daily.   ULORIC 40 MG TABLET    TAKE ONE TABLET BY MOUTH ONCE DAILY   WARFARIN (COUMADIN) 5 MG TABLET    Take 1 & 1/2  tablets daily except 1 tablet on Sundays, Tuesdays, and Thursdays  Modified Medications   No medications on file  Discontinued Medications   No medications on file    Review of Systems  Unable to perform ROS: Other   memory loss  Filed Vitals:   09/24/15 1128  BP: 142/70  Pulse: 85  Temp: 97.9 F (36.6 C)  TempSrc: Oral  Weight: 344 lb (156.037 kg)  SpO2: 98%   Body mass index is 53.87 kg/(m^2).  Physical Exam  Constitutional: She appears well-developed.  Frail appearing in NAD  HENT:  Mouth/Throat: Oropharynx is clear and moist. No oropharyngeal exudate.  Eyes: Pupils are equal, round, and reactive to light.  No scleral icterus.  Neck: Neck supple. Carotid bruit is not present. No tracheal deviation present.  Cardiovascular: Normal rate, regular rhythm, normal heart sounds and intact distal pulses.  Exam reveals no gallop and no friction rub.   No murmur heard. No LE edema b/l. no calf TTP.   Pulmonary/Chest: Effort normal and breath sounds normal. No stridor. No respiratory distress. She has no wheezes. She has no rales.  Abdominal: Soft. Bowel sounds are normal. She exhibits no distension and no mass. There is no hepatomegaly. There is tenderness (epigastric; no CVAT or suprapubic TTP). There is no rebound and no guarding.  Genitourinary:  No vaginal d/c or redness; no urethral lesions; no pubic mons lesions  Musculoskeletal: She exhibits edema and tenderness.  Lymphadenopathy:    She has no cervical adenopathy.  Neurological: She is alert.  Skin: Skin is warm and dry. No rash noted.  Psychiatric: She has a normal mood and affect. Her behavior is normal. Judgment and thought content normal.     Labs reviewed: Office Visit on 09/06/2015  Component Date Value Ref Range Status  . INR 09/06/2015 2.0   Final  Office Visit on 08/09/2015  Component Date Value Ref Range Status  . INR 08/09/2015 1.8   Final  Office Visit on 08/04/2015  Component Date Value Ref Range Status  . Hgb A1c MFr Bld 08/04/2015 11.9* 4.8 - 5.6 % Final   Comment:  Pre-diabetes: 5.7 - 6.4          Diabetes: >6.4          Glycemic control for adults with diabetes: <7.0   . Est. average glucose Bld gHb Est-m* 08/04/2015 295   Final  Abstract on 07/30/2015  Component Date Value Ref Range Status  . [facility]: no evidence of new malignancy. No right breast malignancy   Final  Appointment on 07/30/2015  Component Date Value Ref Range Status  . WBC 07/30/2015 10.9* 3.9 - 10.3 10e3/uL Final  . NEUT# 07/30/2015 7.1* 1.5 - 6.5 10e3/uL Final  . HGB 07/30/2015 9.9* 11.6 - 15.9 g/dL Final  .  HCT 07/30/2015 32.1* 34.8 - 46.6 % Final  . Platelets 07/30/2015 284  145 - 400 10e3/uL Final  . MCV 07/30/2015 84.5  79.5 - 101.0 fL Final  . MCH 07/30/2015 26.1  25.1 - 34.0 pg Final  . MCHC 07/30/2015 30.8* 31.5 - 36.0 g/dL Final  . RBC 07/30/2015 3.80  3.70 - 5.45 10e6/uL Final  . RDW 07/30/2015 15.4* 11.2 - 14.5 % Final  . lymph# 07/30/2015 3.0  0.9 - 3.3 10e3/uL Final  . MONO# 07/30/2015 0.6  0.1 - 0.9 10e3/uL Final  . Eosinophils Absolute 07/30/2015 0.3  0.0 - 0.5 10e3/uL Final  . Basophils Absolute 07/30/2015 0.0  0.0 - 0.1 10e3/uL Final  . NEUT% 07/30/2015 64.5  38.4 - 76.8 % Final  . LYMPH% 07/30/2015 27.6  14.0 - 49.7 % Final  . MONO% 07/30/2015 5.4  0.0 - 14.0 % Final  . EOS% 07/30/2015 2.3  0.0 - 7.0 % Final  . BASO% 07/30/2015 0.2  0.0 - 2.0 % Final  . Retic % 07/30/2015 1.68  0.70 - 2.10 % Final  . Retic Ct Abs 07/30/2015 63.84  33.70 - 90.70 10e3/uL Final  . Immature Retic Fract 07/30/2015 16.30* 1.60 - 10.00 % Final  . Sodium 07/30/2015 139  136 - 145 mEq/L Final  . Potassium 07/30/2015 4.1  3.5 - 5.1 mEq/L Final  . Chloride 07/30/2015 103  98 - 109 mEq/L Final  . CO2 07/30/2015 27  22 - 29 mEq/L Final  . Glucose 07/30/2015 303* 70 - 140 mg/dl Final   Glucose reference range is for nonfasting patients. Fasting glucose reference range is 70- 100.  [name] BUN 07/30/2015 17.8  7.0 - 26.0 mg/dL Final  . Creatinine 07/30/2015 1.3* 0.6 - 1.1 mg/dL Final  . Total Bilirubin 07/30/2015 0.60  0.20 - 1.20 mg/dL Final  . Alkaline Phosphatase 07/30/2015 79  40 - 150 U/L Final  . AST 07/30/2015 17  5 - 34 U/L Final  . ALT 07/30/2015 17  0 - 55 U/L Final  . Total Protein 07/30/2015 7.9  6.4 - 8.3 g/dL Final  . Albumin 07/30/2015 2.9* 3.5 - 5.0 g/dL Final  . Calcium 07/30/2015 9.0  8.4 - 10.4 mg/dL Final  . Anion Gap 07/30/2015 9  3 - 11 mEq/L Final  . EGFR 07/30/2015 46* >90 ml/min/1.73 m2 Final   eGFR is calculated using the CKD-EPI Creatinine Equation (2009)    No results  found.   Assessment/Plan   ICD-9-CM ICD-10-CM   1. Malodorous urine 791.9 R82.90 POC Urinalysis Dipstick     Culture, Urine  2. Simple chronic bronchitis (HCC) 491.0 J41.0 albuterol (PROVENTIL HFA;VENTOLIN HFA) 108 (90 Base) MCG/ACT inhaler     budesonide-formoterol (SYMBICORT) 160-4.5 MCG/ACT inhaler  3. Abnormal urinalysis 791.9 R82.90 Culture, Urine  4. Vaginitis and vulvovaginitis  616.10 N76.0 Culture, Urine  5. Uncontrolled type 2 diabetes mellitus with chronic kidney disease, with long-term current use of insulin, unspecified CKD stage (HCC) 250.42 E11.22    585.9 Z79.4    V58.67 E11.65    Push fluids and rest  Rx assistance forms printed for pt to complete at home for Toujeo and novolog. Sample x2 of toujeo given  Take cipro 2 times daily x 7 days  Start diflucan 1 tab today and repeat in 1 week  Call office if symptoms do not improve or worsen  Follow up as scheduled   Tel Hevia S. Perlie Gold  [facility] [address] [place], [place] [address] [phone] Cell (Monday-Friday 8 AM - 5 PM) [phone] After 5 PM and follow prompts

## 2015-09-24 NOTE — Patient Instructions (Signed)
Push fluids and rest  Take cipro 2 times daily x 7 days  Start diflucan 1 tab today and repeat in 1 week  Call office if symptoms do not improve or worsen  Follow up as scheduled

## 2015-09-26 LAB — URINE CULTURE

## 2015-10-11 ENCOUNTER — Ambulatory Visit (INDEPENDENT_AMBULATORY_CARE_PROVIDER_SITE_OTHER): Payer: PPO | Admitting: Pharmacotherapy

## 2015-10-11 ENCOUNTER — Encounter: Payer: Self-pay | Admitting: Pharmacotherapy

## 2015-10-11 VITALS — BP 128/76 | HR 77 | Temp 97.6°F | Ht 67.0 in | Wt 340.0 lb

## 2015-10-11 DIAGNOSIS — Z7901 Long term (current) use of anticoagulants: Secondary | ICD-10-CM

## 2015-10-11 DIAGNOSIS — G894 Chronic pain syndrome: Secondary | ICD-10-CM | POA: Diagnosis not present

## 2015-10-11 DIAGNOSIS — I2782 Chronic pulmonary embolism: Secondary | ICD-10-CM | POA: Diagnosis not present

## 2015-10-11 LAB — POCT INR: INR: 4

## 2015-10-11 MED ORDER — OXYCODONE-ACETAMINOPHEN 10-325 MG PO TABS: 1.0000 | ORAL_TABLET | Freq: Four times a day (QID) | ORAL | Status: DC | PRN

## 2015-10-11 NOTE — Progress Notes (Signed)
   Subjective:    Patient ID: [name], female    DOB: 1934/06/16, 81 y.o.   MRN: [id number]  HPI Last INR on 09/06/15 was OK at 2.0 Coumadin dose is 7.5mg  QD except 5mg  Su/T/Th Did complete an antibiotic on Friday Denies missed doses. Denies unusual bleeding or bruising. Denies CP, falls Consistent with vitamin K intake  Self reports BG 140-150 range.   Review of Systems  HENT: Negative for nosebleeds.   Respiratory: Negative for shortness of breath.   Cardiovascular: Negative for chest pain.  Genitourinary: Negative for hematuria.  Hematological: Does not bruise/bleed easily.       Objective:   Physical Exam  Constitutional: She is oriented to person, place, and time. She appears well-developed and well-nourished.  HENT:  Right Ear: External ear normal.  Left Ear: External ear normal.  Cardiovascular: Normal rate, regular rhythm and normal heart sounds.   Pulmonary/Chest: Effort normal and breath sounds normal.  Neurological: She is alert and oriented to person, place, and time.  Skin: Skin is warm and dry.  Psychiatric: She has a normal mood and affect. Her behavior is normal. Judgment and thought content normal.  Vitals reviewed.     BP:  128/76   HR:  77  Wt: 340lb INR 4.0    Assessment & Plan:  1.  INR above goal 2-3 due to antibiotic / warfarin interaction 2.  Hold Coumadin x 2 days 3.  Then resume Coumadin 7.5mg  QD except 5mg  Su/T/Th 4.  RTC in 2 weeks

## 2015-10-11 NOTE — Patient Instructions (Signed)
INR 4.0 No Coumadin x 2 doses Then restart Coumadin 7.5mg  (1 & 1/2 tablet) daily except 5mg  (1 tablet) on Sundays, Tuesdays, Thursdays

## 2015-10-11 NOTE — Addendum Note (Signed)
Addended by: Logan Bores on: 10/11/2015 10:47 AM   Modules accepted: Orders

## 2015-10-13 ENCOUNTER — Ambulatory Visit: Payer: PPO | Admitting: Internal Medicine

## 2015-10-22 ENCOUNTER — Telehealth: Payer: Self-pay | Admitting: Hematology

## 2015-10-22 NOTE — Telephone Encounter (Signed)
left msg confirming apt change from 6/23 to 6/30 due to change in provider schedule

## 2015-10-25 ENCOUNTER — Other Ambulatory Visit: Payer: Self-pay | Admitting: Internal Medicine

## 2015-10-26 ENCOUNTER — Telehealth: Payer: Self-pay | Admitting: *Deleted

## 2015-10-26 NOTE — Telephone Encounter (Signed)
I called to check on patient and she stated that she did not go to ER nor call 911. She stated that she did her inhalers again and waited and started to feel alittle bit better, but just alittle she stated. Patient stated that she will just wait to be seen tomorrow at her appointment time.

## 2015-10-26 NOTE — Telephone Encounter (Signed)
Patient called and stated that she was SOB. I could hear patient struggling to breath and when she would take a deep breath you could hear the wheezing and distress. Patient had already taken her inhalers. I instructed her to call 911 to be evaluated. Patient agreed.

## 2015-10-26 NOTE — Telephone Encounter (Signed)
Noted  

## 2015-10-27 ENCOUNTER — Ambulatory Visit (INDEPENDENT_AMBULATORY_CARE_PROVIDER_SITE_OTHER): Payer: PPO | Admitting: Internal Medicine

## 2015-10-27 ENCOUNTER — Encounter: Payer: Self-pay | Admitting: Internal Medicine

## 2015-10-27 VITALS — BP 162/78 | HR 73 | Temp 97.6°F

## 2015-10-27 DIAGNOSIS — C50912 Malignant neoplasm of unspecified site of left female breast: Secondary | ICD-10-CM | POA: Diagnosis not present

## 2015-10-27 DIAGNOSIS — G894 Chronic pain syndrome: Secondary | ICD-10-CM | POA: Diagnosis not present

## 2015-10-27 DIAGNOSIS — I2782 Chronic pulmonary embolism: Secondary | ICD-10-CM | POA: Diagnosis not present

## 2015-10-27 DIAGNOSIS — M1A9XX1 Chronic gout, unspecified, with tophus (tophi): Secondary | ICD-10-CM

## 2015-10-27 DIAGNOSIS — E1122 Type 2 diabetes mellitus with diabetic chronic kidney disease: Secondary | ICD-10-CM

## 2015-10-27 DIAGNOSIS — I10 Essential (primary) hypertension: Secondary | ICD-10-CM | POA: Diagnosis not present

## 2015-10-27 DIAGNOSIS — E1165 Type 2 diabetes mellitus with hyperglycemia: Secondary | ICD-10-CM

## 2015-10-27 DIAGNOSIS — E785 Hyperlipidemia, unspecified: Secondary | ICD-10-CM

## 2015-10-27 DIAGNOSIS — N760 Acute vaginitis: Secondary | ICD-10-CM

## 2015-10-27 DIAGNOSIS — Z794 Long term (current) use of insulin: Secondary | ICD-10-CM

## 2015-10-27 DIAGNOSIS — J42 Unspecified chronic bronchitis: Secondary | ICD-10-CM | POA: Diagnosis not present

## 2015-10-27 DIAGNOSIS — F332 Major depressive disorder, recurrent severe without psychotic features: Secondary | ICD-10-CM | POA: Diagnosis not present

## 2015-10-27 MED ORDER — IPRATROPIUM-ALBUTEROL 0.5-2.5 (3) MG/3ML IN SOLN: 3.0000 mL | Freq: Four times a day (QID) | RESPIRATORY_TRACT | Status: DC

## 2015-10-27 NOTE — Progress Notes (Signed)
Patient ID: [name], female   DOB: 1935-04-05, 81 y.o.   MRN: [id number]    Location:  [name] Place of Service: OFFICE  Chief Complaint  Patient presents with  . Medical Management of Chronic Issues    2 months follow up    HPI:  81 yo female seen today for f/u. She had acute SOb on 6/20th. She used HFA and sx's improved. She never called 911 as instructed by triage nurse. She does not have a nebulizer at home. She has chest congestion and tightness with associated productive cough with clear sputum (occasionally green).  Gout - stable on uloric. No recent flares  CAD/hyperlipidemia/HTN - stable on imdur, lasix, metoprolol. Has not needed SL NTG  Chronic bronchitis - reports SOB and not able to move around as much. She uses HFA prn and symbicort.   PE - on coumadin tx. No bleeding. Last INR 4.0 and coumadin adjusted. No bleeding  Chronic pain syndrome/PMR/OA - stable overall on percocet. She reports increased pain in her chest wall that radiates from left breast to right. She is taking cymbalta also  Insomnia/depression/anxiety - not sleeping well even with medication restoril 33m. She gets about 4 hrs per night which is an improvement. She still takes klonopin and sertraline 253mqhs. Takes cymbalta also  GERD - stable on PPI  DM - BS at home in 100s. No low BS reactions. She is making healthy food choices. She injects 50 units (instead of 60 due to cost of med) Toujeo BID. She has not completed Toujeo Rx assistance forms yet. She takes 25 units novolog with meals. She does not eat 3 full meals per day but is getting 2 per day. She c/o intermittent pain with numbness/tingling in hands and feet. She is unable to grasp and hold objects in right hand.   Breast CA - stable; takes arimidex daily. Followed by oncology  vaginal d/c and itching - x several weeks. brown d/c. Has foul smelling urine and urine is brown colored. No dysuria, hematuria, urinary frequency/urgency. 1 episode  of difficulty starting urine stream but none since. No abdominal pain, N/V, f/c. She completed 7 days of cipro and diflucan tx which helped. She has not seen gyn in several mos and was supposed to have d&c for endometrial polyp but never followed through. She was seeing Dr RoHarrington ChallengerShe is a poor historian due to memory issues. Hx obtained from daughter.  Past Medical History  Diagnosis Date  . Hyperlipidemia     takes Pravastatin daily  . Coronary artery disease     a. s/p IMI 2004 tx with BMS to RCA;  b. s/p Promus DES to RCA 2/12 (cath: LM 20-30%, pLAD 20-30%, mLAD 50%, RI 30%, pRCA 60%, mRCA 90% - tx with PCI);  c. myoview 1/07: EF 49%, inf scar, no isch  . Urinary incontinence     takes Linzess daily  . Insomnia   . Osteoarthritis   . Obstructive sleep apnea   . Pol[place]algia rheumatica ([facility] [place]  . Arthritis   . Pulmonary emboli ([facility]13    felt to need lifelong anticoagulation  . Allergy     takes Mucinex daily as needed  . Gout, unspecified     takes Colchicine daily  . Dysuria   . Pain, chronic   . Osteoarthrosis, unspecified whether generalized or localized, lower leg   . Anemia, unspecified   . Coronary atherosclerosis of native coronary artery   . CHF (congestive heart failure) (  [place])     takes Furosemide daily  . Depression     takes Cymbalta daily  . Anxiety     takes Clonazepam daily as needed  . Hypertension     takes Imdur and Metoprolol daily  . GERD (gastroesophageal reflux disease)     takes Protonix daily  . Diabetes mellitus     insulin daily  . Type II or unspecified type diabetes mellitus without mention of complication, uncontrolled   . Myocardial infarction ([place]) 2004  . History of blood clots about 44yr ago    in legs-takes Coumadin   . Shortness of breath dyspnea     with exertion and has Albuterol inhaler prn  . Headache     occasionally  . Vertigo     hx of;was taking Meclizine if needed  . Numbness   . Joint pain   . Joint swelling   .  Osteoarthritis     Past Surgical History  Procedure Laterality Date  . Appendectomy    . Cardiac catheterization    . [facility] bypass  1977     [facility] . Blood clots/legs and lungs  2013  . Mi with stent placement  2004  . Left heart catheterization with coronary angiogram N/A 06/29/2014    Procedure: LEFT HEART CATHETERIZATION WITH CORONARY ANGIOGRAM;  Surgeon: TTroy Sine MD;  [facility];  Service: Cardiovascular;  Laterality: N/A;  . Coronary angioplasty  2  . Eye surgery Bilateral     cataract   . Colonoscopy    . Abdominal hysterectomy      partial  . Breast lumpectomy with radioactive seed localization Left 11/05/2014    Procedure: LEFT BREAST LUMPECTOMY WITH RADIOACTIVE SEED LOCALIZATION;  Surgeon: DCoralie Keens MD;  [place]  Service: General;  Laterality: Left;  . Excision of skin tag Right 11/05/2014    Procedure: EXCISION OF RIGHT EYELID SKIN TAG;  Surgeon: DCoralie Keens MD;  [place]  Service: General;  Laterality: Right;    Patient Care Team: MGildardo Cranker DO as PCP - General (Internal Medicine) TRenella Cunas MD as Consulting Physician (Cardiology) RClent Jacks MD as Consulting Physician (Ophthalmology) DCoralie Keens MD as Consulting Physician (General Surgery)  Social History   Social History  . Marital Status: Widowed    Spouse Name: N/A  . Number of Children: N/A  . Years of Education: N/A   Occupational History  . retired    Social History Main Topics  . Smoking status: Never Smoker   . Smokeless tobacco: Never Used  . Alcohol Use: No  . Drug Use: No  . Sexual Activity: Not Currently   Other Topics Concern  . Not on file   Social History Narrative     reports that she has never smoked. She has never used smokeless tobacco. She reports that she does not drink alcohol or use illicit drugs.  Family History  Problem Relation Age of Onset  . Breast cancer Mother 657 . Heart  disease Mother   . Throat cancer Father   . Hypertension Father   . Arthritis Father   . Diabetes Father   . Arthritis Sister   . Obesity Sister   . Diabetes Sister    Family Status  Relation Status Death Age  . Mother Deceased 696   Cause of Death: Breast cancer  . Father Deceased 968   Cause of Death: Complications of diabetes & HTN  .  Sister Deceased 3  . Sister Alive   . Daughter Alive   . Son Alive   . Sister Alive   . Sister Alive   . Brother Alive   . Daughter Alive   . Maternal Grandmother Deceased   . Maternal Grandfather Deceased   . Paternal Grandmother Deceased   . Paternal Grandfather Deceased      Allergies  Allergen Reactions  . Sulfonamide Derivatives Swelling    Mouth swelling  . Tramadol Nausea And Vomiting    Medications: Patient's Medications  New Prescriptions   No medications on file  Previous Medications   ALBUTEROL (PROVENTIL HFA;VENTOLIN HFA) 108 (90 BASE) MCG/ACT INHALER    Inhale 1-2 puffs into the lungs every 6 (six) hours as needed for wheezing or shortness of breath.   ANASTROZOLE (ARIMIDEX) 1 MG TABLET    Take 1 tablet (1 mg total) by mouth daily.   BUDESONIDE-FORMOTEROL (SYMBICORT) 160-4.5 MCG/ACT INHALER    Inhale 2 puffs into the lungs 2 (two) times daily as needed (shortness of breath).   CALCIUM CARBONATE ANTACID (TUMS PO)    Take 3 tablets by mouth daily as needed (acid reflux).   CHOLECALCIFEROL (VITAMIN D) 1000 UNITS TABLET    Take 1,000 Units by mouth daily.   CLONAZEPAM (KLONOPIN) 1 MG TABLET    TAKE TWO TABLETS BY MOUTH AT BEDTIME AS NEEDED FOR SLEEP   DIPHENHYDRAMINE (BENADRYL) 25 MG TABLET    Take 50 mg by mouth daily as needed for itching. For itching   DULOXETINE (CYMBALTA) 60 MG CAPSULE    TAKE ONE CAPSULE BY MOUTH ONCE DAILY FOR ANXIETY   FLUCONAZOLE (DIFLUCAN) 150 MG TABLET    Take 1 tablet (150 mg total) by mouth daily. May repeat dose in 1 week   INSULIN ASPART (NOVOLOG) 100 UNIT/ML INJECTION    Inject 25 Units  into the skin 3 (three) times daily with meals.   INSULIN GLARGINE (TOUJEO SOLOSTAR) 300 UNIT/ML SOPN    Inject 60 Units into the skin 2 (two) times daily.   ISOSORBIDE MONONITRATE (IMDUR) 60 MG 24 HR TABLET    TAKE ONE TABLET BY MOUTH ONCE DAILY   LANCETS (ONETOUCH ULTRASOFT) LANCETS    Use to test blood sugar Dx: E11.9   MECLIZINE (ANTIVERT) 25 MG TABLET    Take 1 tablet (25 mg total) by mouth 3 (three) times daily.   METOPROLOL SUCCINATE (TOPROL-XL) 25 MG 24 HR TABLET    TAKE ONE TABLET BY MOUTH ONCE DAILY FOR BLOOD PRESSURE   NITROGLYCERIN (NITROSTAT) 0.4 MG SL TABLET    Take one tablet under the tongue every 5 minutes as needed for chest pain   OXYCODONE-ACETAMINOPHEN (PERCOCET) 10-325 MG TABLET    Take 1 tablet by mouth every 6 (six) hours as needed for pain.   PANTOPRAZOLE (PROTONIX) 40 MG TABLET    TAKE ONE TABLET BY MOUTH ONCE DAILY   PRAVASTATIN (PRAVACHOL) 20 MG TABLET    TAKE ONE TABLET BY MOUTH ONCE DAILY FOR CHOLESTEROL   PRAVASTATIN (PRAVACHOL) 20 MG TABLET    TAKE ONE TABLET BY MOUTH ONCE DAILY FOR CHOLESTEROL   SACCHAROMYCES BOULARDII (FLORASTOR) 250 MG CAPSULE    Take 1 capsule (250 mg total) by mouth 2 (two) times daily.   SERTRALINE (ZOLOFT) 25 MG TABLET    Take 1 tablet (25 mg total) by mouth at bedtime.   TEMAZEPAM (RESTORIL) 30 MG CAPSULE    TAKE ONE CAPSULE BY MOUTH AT BEDTIME AS NEEDED FOR SLEEP  TRIAMCINOLONE CREAM (KENALOG) 0.1 %    Apply 1 application topically 2 (two) times daily.   ULORIC 40 MG TABLET    TAKE ONE TABLET BY MOUTH ONCE DAILY   WARFARIN (COUMADIN) 5 MG TABLET    Take 1 & 1/2  tablets daily except 1 tablet on Sundays, Tuesdays, and Thursdays  Modified Medications   No medications on file  Discontinued Medications   No medications on file    Review of Systems  Unable to perform ROS: Dementia    Filed Vitals:   10/27/15 1136  BP: 162/78  Pulse: 73  Temp: 97.6 F (36.4 C)  TempSrc: Oral  SpO2: 97%   There is no weight on file to calculate  BMI.  Physical Exam  Constitutional: She appears well-developed and well-nourished.  HENT:  Mouth/Throat: Oropharynx is clear and moist. No oropharyngeal exudate.  Eyes: Pupils are equal, round, and reactive to light. No scleral icterus.  Neck: Neck supple. Carotid bruit is not present. No tracheal deviation present. No thyromegaly present.  Cardiovascular: Normal rate, regular rhythm and intact distal pulses.  Exam reveals no gallop and no friction rub.   Murmur (1/6 SEM) heard. +1 pitting LE edema b/l. no calf TTP.   Pulmonary/Chest: Effort normal. No stridor. No respiratory distress. She has wheezes (R>L end expiratory). She has no rales. She exhibits tenderness (multiple ACW TP).  Abdominal: Soft. Bowel sounds are normal. She exhibits no distension and no mass. There is no hepatomegaly. There is no tenderness. There is no rebound and no guarding.  Musculoskeletal: She exhibits edema and tenderness.  Lymphadenopathy:    She has no cervical adenopathy.  Neurological: She is alert.  Skin: Skin is warm and dry. No rash noted.  Psychiatric: She has a normal mood and affect. Her behavior is normal.     Labs reviewed: Office Visit on 10/11/2015  Component Date Value Ref Range Status  . INR 10/11/2015 4.0   Final  Office Visit on 09/24/2015  Component Date Value Ref Range Status  . Color, UA 09/24/2015 Dark Yellow   Final  . Clarity, UA 09/24/2015 Cloudy   Final  . Glucose, UA 09/24/2015 Neg   Final  . Bilirubin, UA 09/24/2015 Neg   Final  . Ketones, UA 09/24/2015 Neg   Final  . Spec Grav, UA 09/24/2015 1.015   Final  . Blood, UA 09/24/2015 Trace   Final  . pH, UA 09/24/2015 5.0   Final  . Protein, UA 09/24/2015 Trace   Final  . Urobilinogen, UA 09/24/2015 0.2   Final  . Nitrite, UA 09/24/2015 Neg   Final  . Leukocytes, UA 09/24/2015 moderate (2+)* Negative Final   Urine colleced in a urine hat   . Urine Culture, Routine 09/24/2015 Final report   Final  . Urine Culture result 1  09/24/2015 Comment   Final   Comment: Greater than 2 organisms recovered, none predominant. Please submit another sample if clinically indicated. Greater than 100,000 colony forming units per mL   Office Visit on 09/06/2015  Component Date Value Ref Range Status  . INR 09/06/2015 2.0   Final  Office Visit on 08/09/2015  Component Date Value Ref Range Status  . INR 08/09/2015 1.8   Final  Office Visit on 08/04/2015  Component Date Value Ref Range Status  . Hgb A1c MFr Bld 08/04/2015 11.9* 4.8 - 5.6 % Final   Comment:          Pre-diabetes: 5.7 - 6.4  Diabetes: >6.4          Glycemic control for adults with diabetes: <7.0   . Est. average glucose Bld gHb Est-m* 08/04/2015 295   Final  Abstract on 07/30/2015  Component Date Value Ref Range Status  . [facility]: no evidence of new malignancy. No right breast malignancy   Final  Appointment on 07/30/2015  Component Date Value Ref Range Status  . WBC 07/30/2015 10.9* 3.9 - 10.3 10e3/uL Final  . NEUT# 07/30/2015 7.1* 1.5 - 6.5 10e3/uL Final  . HGB 07/30/2015 9.9* 11.6 - 15.9 g/dL Final  . HCT 07/30/2015 32.1* 34.8 - 46.6 % Final  . Platelets 07/30/2015 284  145 - 400 10e3/uL Final  . MCV 07/30/2015 84.5  79.5 - 101.0 fL Final  . MCH 07/30/2015 26.1  25.1 - 34.0 pg Final  . MCHC 07/30/2015 30.8* 31.5 - 36.0 g/dL Final  . RBC 07/30/2015 3.80  3.70 - 5.45 10e6/uL Final  . RDW 07/30/2015 15.4* 11.2 - 14.5 % Final  . lymph# 07/30/2015 3.0  0.9 - 3.3 10e3/uL Final  . MONO# 07/30/2015 0.6  0.1 - 0.9 10e3/uL Final  . Eosinophils Absolute 07/30/2015 0.3  0.0 - 0.5 10e3/uL Final  . Basophils Absolute 07/30/2015 0.0  0.0 - 0.1 10e3/uL Final  . NEUT% 07/30/2015 64.5  38.4 - 76.8 % Final  . LYMPH% 07/30/2015 27.6  14.0 - 49.7 % Final  . MONO% 07/30/2015 5.4  0.0 - 14.0 % Final  . EOS% 07/30/2015 2.3  0.0 - 7.0 % Final  . BASO% 07/30/2015 0.2  0.0 - 2.0 % Final  . Retic % 07/30/2015 1.68  0.70 - 2.10 % Final    . Retic Ct Abs 07/30/2015 63.84  33.70 - 90.70 10e3/uL Final  . Immature Retic Fract 07/30/2015 16.30* 1.60 - 10.00 % Final  . Sodium 07/30/2015 139  136 - 145 mEq/L Final  . Potassium 07/30/2015 4.1  3.5 - 5.1 mEq/L Final  . Chloride 07/30/2015 103  98 - 109 mEq/L Final  . CO2 07/30/2015 27  22 - 29 mEq/L Final  . Glucose 07/30/2015 303* 70 - 140 mg/dl Final   Glucose reference range is for nonfasting patients. Fasting glucose reference range is 70- 100.  [name] BUN 07/30/2015 17.8  7.0 - 26.0 mg/dL Final  . Creatinine 07/30/2015 1.3* 0.6 - 1.1 mg/dL Final  . Total Bilirubin 07/30/2015 0.60  0.20 - 1.20 mg/dL Final  . Alkaline Phosphatase 07/30/2015 79  40 - 150 U/L Final  . AST 07/30/2015 17  5 - 34 U/L Final  . ALT 07/30/2015 17  0 - 55 U/L Final  . Total Protein 07/30/2015 7.9  6.4 - 8.3 g/dL Final  . Albumin 07/30/2015 2.9* 3.5 - 5.0 g/dL Final  . Calcium 07/30/2015 9.0  8.4 - 10.4 mg/dL Final  . Anion Gap 07/30/2015 9  3 - 11 mEq/L Final  . EGFR 07/30/2015 46* >90 ml/min/1.73 m2 Final   eGFR is calculated using the CKD-EPI Creatinine Equation (2009)    No results found.   Assessment/Plan   ICD-9-CM ICD-10-CM   1. Uncontrolled type 2 diabetes mellitus with chronic kidney disease, with long-term current use of insulin, unspecified CKD stage (HCC) 250.42 E11.22 Hemoglobin A1c   585.9 Z79.4 CANCELED: Hemoglobin A1c   V58.67 E11.65   2. Chronic bronchitis, unspecified chronic bronchitis type ([place]) 491.9 J42    failing to change as expected  3. Severe episode of recurrent major depressive disorder, without psychotic  features ([place]) 296.33 F33.2   4. Chronic pain syndrome 338.4 G89.4   5. Other chronic pulmonary embolism without acute cor pulmonale (HCC)  I27.82   6. Gout with tophus, unspecified cause, unspecified chronicity, unspecified site 274.82 [id number]   7. Essential hypertension 401.9 I10   8. Hyperlipidemia 272.4 E78.5 Lipid Panel     CANCELED: Lipid Panel  9. Vaginitis and  vulvovaginitis 616.10 N76.0 Ambulatory referral to Gynecology  10. Morbid obesity due to excess calories ([place]) 278.01 E66.01   11. Invasive ductal carcinoma of breast, left (HCC) 174.9 C50.912     Start albuterol nebs every 6 hrs as needed for difficulty breathing, wheezing, cough  Continue other medications as ordered  Will call with referral to GYN  F/u with [facility] for INR mx  Follow up with oncology as scheduled  Get labs drawn at oncology office appt  She needs to complete Rx assistance forms for Toujeo  Follow up in 3 mos for routine visit.   Shakesha Soltau S. Perlie Gold  [facility] [address] [place], [place] [address] [phone] Cell (Monday-Friday 8 AM - 5 PM) [phone] After 5 PM and follow prompts

## 2015-10-27 NOTE — Addendum Note (Signed)
Addended by: Moshe Cipro, Hannibal Skalla A on: 10/27/2015 01:59 PM   Modules accepted: Orders

## 2015-10-27 NOTE — Patient Instructions (Addendum)
Start albuterol nebs every 6 hrs as needed for difficulty breathing, wheezing, cough  Continue other medications as ordered  Will call with referral to GYN  Follow up with oncology as scheduled  Get labs drawn at oncology office appt  Follow up in 3 mos for routine visit.

## 2015-10-29 ENCOUNTER — Ambulatory Visit: Payer: PPO | Admitting: Hematology

## 2015-10-29 ENCOUNTER — Other Ambulatory Visit: Payer: PPO

## 2015-11-01 ENCOUNTER — Ambulatory Visit (INDEPENDENT_AMBULATORY_CARE_PROVIDER_SITE_OTHER): Payer: PPO | Admitting: Pharmacotherapy

## 2015-11-01 ENCOUNTER — Encounter: Payer: Self-pay | Admitting: Pharmacotherapy

## 2015-11-01 VITALS — BP 150/74 | HR 81 | Temp 97.6°F | Resp 20 | Ht 67.0 in | Wt 343.6 lb

## 2015-11-01 DIAGNOSIS — I2782 Chronic pulmonary embolism: Secondary | ICD-10-CM

## 2015-11-01 DIAGNOSIS — Z7901 Long term (current) use of anticoagulants: Secondary | ICD-10-CM | POA: Diagnosis not present

## 2015-11-01 LAB — POCT INR: INR: 2.5

## 2015-11-01 MED ORDER — ONETOUCH ULTRASOFT LANCETS MISC: Status: DC

## 2015-11-01 NOTE — Progress Notes (Signed)
   Subjective:    Patient ID: [name], female    DOB: November 03, 1934, 81 y.o.   MRN: [id number]  HPI Last INR was high at 4.0 due to antibiotic / warfarin interaction  Current Coumadin dose is 7.5mg  QD except 5mg  Su/T/Th Denies missed doses Denies unusual bleeding or bruising Denies CP Has frequent SOB - treated with albuterol No falls Consistent with vitamin K.    Review of Systems  HENT: Negative for nosebleeds.   Respiratory: Positive for shortness of breath.   Cardiovascular: Negative for chest pain and leg swelling.  Gastrointestinal: Negative for blood in stool and anal bleeding.  Genitourinary: Negative for hematuria.  Hematological: Does not bruise/bleed easily.       Objective:   Physical Exam  Constitutional: She is oriented to person, place, and time. She appears well-developed and well-nourished.  HENT:  Right Ear: External ear normal.  Left Ear: External ear normal.  Cardiovascular: Normal rate and regular rhythm.   Pulmonary/Chest: Effort normal and breath sounds normal.  Neurological: She is alert and oriented to person, place, and time.  Skin: Skin is warm and dry.  Psychiatric: She has a normal mood and affect. Her behavior is normal. Judgment and thought content normal.  Vitals reviewed.   BP: 150/74  HR: 81 wt:  343lb INR 2.5      Assessment & Plan:  1.  INR at goal 2-3 2.  Continue Coumadin 7.5mg  QD except 5mg  Su/T/Th 3.  INR in 1 month

## 2015-11-01 NOTE — Patient Instructions (Signed)
INR 2.5 Continue Coumadin 7.5mg  (1 & 1/2 tablets) daily except 5mg  (1 tablet) on Sundays, Tuesdays, Thursdays

## 2015-11-03 ENCOUNTER — Other Ambulatory Visit: Payer: Self-pay | Admitting: Pharmacotherapy

## 2015-11-04 ENCOUNTER — Telehealth: Payer: Self-pay | Admitting: *Deleted

## 2015-11-04 ENCOUNTER — Encounter (HOSPITAL_COMMUNITY): Payer: Self-pay | Admitting: Emergency Medicine

## 2015-11-04 ENCOUNTER — Ambulatory Visit (HOSPITAL_COMMUNITY)
Admission: EM | Admit: 2015-11-04 | Discharge: 2015-11-04 | Disposition: A | Discharge status: Home / Self Care | Payer: PPO | Attending: Family Medicine | Admitting: Family Medicine

## 2015-11-04 DIAGNOSIS — R05 Cough: Secondary | ICD-10-CM

## 2015-11-04 DIAGNOSIS — R059 Cough, unspecified: Secondary | ICD-10-CM

## 2015-11-04 DIAGNOSIS — N763 Subacute and chronic vulvitis: Secondary | ICD-10-CM | POA: Diagnosis not present

## 2015-11-04 DIAGNOSIS — J209 Acute bronchitis, unspecified: Secondary | ICD-10-CM

## 2015-11-04 MED ORDER — BENZONATATE 100 MG PO CAPS: 200.0000 mg | ORAL_CAPSULE | Freq: Three times a day (TID) | ORAL | Status: DC | PRN

## 2015-11-04 MED ORDER — ALBUTEROL SULFATE (2.5 MG/3ML) 0.083% IN NEBU
2.5000 mg | INHALATION_SOLUTION | RESPIRATORY_TRACT | Status: AC
  Administered 2015-11-04: 2.5 mg via RESPIRATORY_TRACT

## 2015-11-04 MED ORDER — ALBUTEROL SULFATE (2.5 MG/3ML) 0.083% IN NEBU
INHALATION_SOLUTION | RESPIRATORY_TRACT | Status: AC
  Filled 2015-11-04: qty 3

## 2015-11-04 MED ORDER — METHYLPREDNISOLONE 4 MG PO TBPK: ORAL_TABLET | ORAL | Status: DC

## 2015-11-04 MED ORDER — AEROCHAMBER PLUS FLO-VU LARGE MISC: 1.0000 | Freq: Once | Status: DC

## 2015-11-04 MED ORDER — IPRATROPIUM-ALBUTEROL 0.5-2.5 (3) MG/3ML IN SOLN
RESPIRATORY_TRACT | Status: AC
  Filled 2015-11-04: qty 3

## 2015-11-04 MED ORDER — AZITHROMYCIN 250 MG PO TABS
ORAL_TABLET | ORAL | Status: DC
Start: 2015-11-04 — End: 2015-11-16

## 2015-11-04 NOTE — Telephone Encounter (Signed)
Patient called and stated that she needed the number to the place she dropped her Rx off to for a [employer]. I am not sure where she dropped it off at to give her a number. Patient stated that she will get her daughter [name] to return call with that information.

## 2015-11-04 NOTE — Discharge Instructions (Signed)

## 2015-11-04 NOTE — ED Provider Notes (Signed)
CSN: [id number]     Arrival date & time 11/04/15  1910 History   None    Chief Complaint  Patient presents with  . URI   (Consider location/radiation/quality/duration/timing/severity/associated sxs/prior Treatment) Patient is a 81 y.o. female presenting with URI. The history is provided by the patient.  URI Presenting symptoms: congestion, cough and fatigue   Severity:  Moderate Onset quality:  Gradual Duration:  2 weeks Timing:  Constant Progression:  Worsening Chronicity:  Recurrent Relieved by:  Nothing Worsened by:  Nothing tried Associated symptoms: wheezing   Risk factors: diabetes mellitus     Past Medical History  Diagnosis Date  . Hyperlipidemia     takes Pravastatin daily  . Coronary artery disease     a. s/p IMI 2004 tx with BMS to RCA;  b. s/p Promus DES to RCA 2/12 (cath: LM 20-30%, pLAD 20-30%, mLAD 50%, RI 30%, pRCA 60%, mRCA 90% - tx with PCI);  c. myoview 1/07: EF 49%, inf scar, no isch  . Urinary incontinence     takes Linzess daily  . Insomnia   . Osteoarthritis   . Obstructive sleep apnea   . Polymyalgia rheumatica ([place])   . Arthritis   . Pulmonary emboli ([place]) 9/13    felt to need lifelong anticoagulation  . Allergy     takes Mucinex daily as needed  . Gout, unspecified     takes Colchicine daily  . Dysuria   . Pain, chronic   . Osteoarthrosis, unspecified whether generalized or localized, lower leg   . Anemia, unspecified   . Coronary atherosclerosis of native coronary artery   . CHF (congestive heart failure) (HCC)     takes Furosemide daily  . Depression     takes Cymbalta daily  . Anxiety     takes Clonazepam daily as needed  . Hypertension     takes Imdur and Metoprolol daily  . GERD (gastroesophageal reflux disease)     takes Protonix daily  . Diabetes mellitus     insulin daily  . Type II or unspecified type diabetes mellitus without mention of complication, uncontrolled   . Myocardial infarction ([place]) 2004  . History of blood  clots about 74yrs ago    in legs-takes Coumadin   . Shortness of breath dyspnea     with exertion and has Albuterol inhaler prn  . Headache     occasionally  . Vertigo     hx of;was taking Meclizine if needed  . Numbness   . Joint pain   . Joint swelling   . Osteoarthritis    Past Surgical History  Procedure Laterality Date  . Appendectomy    . Cardiac catheterization    . Gastric bypass  1977     reversed in 1979, [facility]  . Blood clots/legs and lungs  2013  . Mi with stent placement  2004  . Left heart catheterization with coronary angiogram N/A 06/29/2014    Procedure: LEFT HEART CATHETERIZATION WITH CORONARY ANGIOGRAM;  Surgeon: Troy Sine, MD;  Location: [facility] CATH LAB;  Service: Cardiovascular;  Laterality: N/A;  . Coronary angioplasty  2  . Eye surgery Bilateral     cataract   . Colonoscopy    . Abdominal hysterectomy      partial  . Breast lumpectomy with radioactive seed localization Left 11/05/2014    Procedure: LEFT BREAST LUMPECTOMY WITH RADIOACTIVE SEED LOCALIZATION;  Surgeon: [name], MD;  Location: [place];  Service: General;  Laterality: Left;  .  Excision of skin tag Right 11/05/2014    Procedure: EXCISION OF RIGHT EYELID SKIN TAG;  Surgeon: [name], MD;  Location: [place];  Service: General;  Laterality: Right;   Family History  Problem Relation Age of Onset  . Breast cancer Mother 51  . Heart disease Mother   . Throat cancer Father   . Hypertension Father   . Arthritis Father   . Diabetes Father   . Arthritis Sister   . Obesity Sister   . Diabetes Sister    Social History  Substance Use Topics  . Smoking status: Never Smoker   . Smokeless tobacco: Never Used  . Alcohol Use: No   OB History    No data available     Review of Systems  Constitutional: Positive for fatigue.  HENT: Positive for congestion.   Eyes: Negative.   Respiratory: Positive for cough and wheezing.   Cardiovascular: Negative.   Gastrointestinal:  Negative.   Endocrine: Negative.   Genitourinary: Negative.   Musculoskeletal: Negative.   Skin: Negative.   Allergic/Immunologic: Negative.   Neurological: Negative.   Hematological: Negative.   Psychiatric/Behavioral: Negative.     Allergies  Sulfonamide derivatives and Tramadol  Home Medications   Prior to Admission medications   Medication Sig Start Date End Date Taking? Authorizing Provider  albuterol (PROVENTIL HFA;VENTOLIN HFA) 108 (90 Base) MCG/ACT inhaler Inhale 1-2 puffs into the lungs every 6 (six) hours as needed for wheezing or shortness of breath. 09/24/15  Yes Gildardo Cranker, DO  budesonide-formoterol (SYMBICORT) 160-4.5 MCG/ACT inhaler Inhale 2 puffs into the lungs 2 (two) times daily as needed (shortness of breath). 09/24/15  Yes Gildardo Cranker, DO  cholecalciferol (VITAMIN D) 1000 units tablet Take 1,000 Units by mouth daily.   Yes Historical Provider, MD  clonazePAM (KLONOPIN) 1 MG tablet TAKE TWO TABLETS BY MOUTH AT BEDTIME AS NEEDED FOR SLEEP 10/26/15  Yes [name], NP  diphenhydrAMINE (BENADRYL) 25 MG tablet Take 50 mg by mouth daily as needed for itching. For itching   Yes Historical Provider, MD  DULoxetine (CYMBALTA) 60 MG capsule TAKE ONE CAPSULE BY MOUTH ONCE DAILY FOR ANXIETY 10/26/15  Yes Tiffany L Reed, DO  insulin aspart (NOVOLOG) 100 UNIT/ML injection Inject 25 Units into the skin 3 (three) times daily with meals. 08/04/15  Yes Gildardo Cranker, DO  Insulin Glargine (TOUJEO SOLOSTAR) 300 UNIT/ML SOPN Inject 60 Units into the skin 2 (two) times daily.   Yes Historical Provider, MD  isosorbide mononitrate (IMDUR) 60 MG 24 hr tablet TAKE ONE TABLET BY MOUTH ONCE DAILY 10/26/15  Yes Tiffany L Reed, DO  Lancets [facility] ULTRASOFT) lancets Use to test blood sugar Dx: E11.9 11/01/15  Yes Gildardo Cranker, DO  meclizine (ANTIVERT) 25 MG tablet Take 1 tablet (25 mg total) by mouth 3 (three) times daily. 09/26/14  Yes Barton Dubois, MD  metoprolol succinate (TOPROL-XL) 25  MG 24 hr tablet TAKE ONE TABLET BY MOUTH ONCE DAILY FOR BLOOD PRESSURE 07/14/15  Yes Gildardo Cranker, DO  nitroGLYCERIN (NITROSTAT) 0.4 MG SL tablet Take one tablet under the tongue every 5 minutes as needed for chest pain 09/24/13  Yes [name], NP  oxyCODONE-acetaminophen (PERCOCET) 10-325 MG tablet Take 1 tablet by mouth every 6 (six) hours as needed for pain. 10/11/15  Yes Tiffany L Reed, DO  pantoprazole (PROTONIX) 40 MG tablet TAKE ONE TABLET BY MOUTH ONCE DAILY 10/26/15  Yes Tiffany L Reed, DO  sertraline (ZOLOFT) 25 MG tablet Take 1 tablet (25 mg  total) by mouth at bedtime. 06/02/15  Yes Monica Carter, DO  temazepam (RESTORIL) 30 MG capsule TAKE ONE CAPSULE BY MOUTH AT BEDTIME AS NEEDED FOR SLEEP 10/26/15  Yes [name], NP  warfarin (COUMADIN) 5 MG tablet Take 1 & 1/2  tablets daily except 1 tablet on Sundays, Tuesdays, and Thursdays 08/09/15  Yes Cathey Miller, RPH-CPP  anastrozole (ARIMIDEX) 1 MG tablet Take 1 tablet (1 mg total) by mouth daily. 07/30/15   [name], MD  azithromycin (ZITHROMAX) 250 MG tablet Take 2 po first day and then one po qd x 4 days 11/04/15   Lysbeth Penner, FNP  benzonatate (TESSALON) 100 MG capsule Take 2 capsules (200 mg total) by mouth 3 (three) times daily as needed for cough. 11/04/15   Lysbeth Penner, FNP  Calcium Carbonate Antacid (TUMS PO) Take 3 tablets by mouth daily as needed (acid reflux).    Historical Provider, MD  methylPREDNISolone (MEDROL DOSEPAK) 4 MG TBPK tablet Take 6-5-4-3-2-1 po qd 11/04/15   Lysbeth Penner, FNP  [facility] VERIO test strip USE AS DIRECTED 11/03/15   Gildardo Cranker, DO  saccharomyces boulardii (FLORASTOR) 250 MG capsule Take 1 capsule (250 mg total) by mouth 2 (two) times daily. 09/24/15   Gildardo Cranker, DO  Spacer/Aero-Holding Chambers (AEROCHAMBER PLUS FLO-VU LARGE) MISC 1 each by Other route once. 11/04/15   Lysbeth Penner, FNP  triamcinolone cream (KENALOG) 0.1 % Apply 1 application topically 2 (two) times  daily. 09/06/15   Tivis Ringer, RPH-CPP  ULORIC 40 MG tablet TAKE ONE TABLET BY MOUTH ONCE DAILY 08/30/15   Gildardo Cranker, DO   Meds Ordered and Administered this Visit   Medications  albuterol (PROVENTIL) (2.5 MG/3ML) 0.083% nebulizer solution 2.5 mg (2.5 mg Nebulization Given 11/04/15 2008)    BP 167/82 mmHg  Pulse 94  Temp(Src) 98 F (36.7 C) (Oral)  Resp 20  SpO2 98% No data found.   Physical Exam  Constitutional: She is oriented to person, place, and time. She appears well-developed and well-nourished.  HENT:  Head: Normocephalic.  Right Ear: External ear normal.  Left Ear: External ear normal.  Mouth/Throat: Oropharynx is clear and moist.  Eyes: Conjunctivae and EOM are normal. Pupils are equal, round, and reactive to light.  Neck: Normal range of motion. Neck supple.  Cardiovascular: Normal rate, regular rhythm and normal heart sounds.   Pulmonary/Chest: Effort normal. She has wheezes.  Abdominal: Soft. Bowel sounds are normal.  Musculoskeletal: Normal range of motion.  Neurological: She is alert and oriented to person, place, and time.  Skin: Skin is warm and dry.    ED Course  Procedures (including critical care time)  Labs Review Labs Reviewed - No data to display  Imaging Review No results found.   Visual Acuity Review  Right Eye Distance:   Left Eye Distance:   Bilateral Distance:    Right Eye Near:   Left Eye Near:    Bilateral Near:         MDM   1. Bronchospasm with bronchitis, acute   2. Cough    Albuterol neb tx 2.5mg /55ml  Medrol dose pack as directed 4mg  #21 Z-pak as directed Spacer for MDI inhaler  Follow up with PCP   Lysbeth Penner, FNP 11/04/15 2026

## 2015-11-04 NOTE — ED Notes (Signed)
C/o cold sx onset x2 weeks Sx today include SOB, wheezing, prod cough, congestion Using her rescue inhalers w/no relief.  A&O x4... NAD

## 2015-11-05 ENCOUNTER — Other Ambulatory Visit: Payer: PPO

## 2015-11-05 ENCOUNTER — Ambulatory Visit: Payer: PPO | Admitting: Hematology

## 2015-11-05 ENCOUNTER — Emergency Department (HOSPITAL_COMMUNITY)
Admission: EM | Admit: 2015-11-05 | Discharge: 2015-11-05 | Disposition: A | Discharge status: Home / Self Care | Payer: PPO | Attending: Emergency Medicine | Admitting: Emergency Medicine

## 2015-11-05 ENCOUNTER — Emergency Department (HOSPITAL_COMMUNITY): Payer: PPO

## 2015-11-05 ENCOUNTER — Encounter (HOSPITAL_COMMUNITY): Payer: Self-pay | Admitting: *Deleted

## 2015-11-05 DIAGNOSIS — I110 Hypertensive heart disease with heart failure: Secondary | ICD-10-CM | POA: Diagnosis not present

## 2015-11-05 DIAGNOSIS — E119 Type 2 diabetes mellitus without complications: Secondary | ICD-10-CM | POA: Diagnosis not present

## 2015-11-05 DIAGNOSIS — I2510 Atherosclerotic heart disease of native coronary artery without angina pectoris: Secondary | ICD-10-CM | POA: Diagnosis not present

## 2015-11-05 DIAGNOSIS — I252 Old myocardial infarction: Secondary | ICD-10-CM | POA: Diagnosis not present

## 2015-11-05 DIAGNOSIS — R079 Chest pain, unspecified: Secondary | ICD-10-CM | POA: Diagnosis not present

## 2015-11-05 DIAGNOSIS — R0602 Shortness of breath: Secondary | ICD-10-CM | POA: Diagnosis not present

## 2015-11-05 DIAGNOSIS — I509 Heart failure, unspecified: Secondary | ICD-10-CM | POA: Diagnosis not present

## 2015-11-05 DIAGNOSIS — Z5321 Procedure and treatment not carried out due to patient leaving prior to being seen by health care provider: Secondary | ICD-10-CM | POA: Insufficient documentation

## 2015-11-05 LAB — CBC
HCT: 32.4 % — ABNORMAL LOW (ref 36.0–46.0)
Hemoglobin: 9.6 g/dL — ABNORMAL LOW (ref 12.0–15.0)
MCH: 26.2 pg (ref 26.0–34.0)
MCHC: 29.6 g/dL — ABNORMAL LOW (ref 30.0–36.0)
MCV: 88.5 fL (ref 78.0–100.0)
Platelets: 323 10*3/uL (ref 150–400)
RBC: 3.66 MIL/uL — ABNORMAL LOW (ref 3.87–5.11)
RDW: 15.1 % (ref 11.5–15.5)
WBC: 13.8 10*3/uL — ABNORMAL HIGH (ref 4.0–10.5)

## 2015-11-05 LAB — BASIC METABOLIC PANEL
Anion gap: 8 (ref 5–15)
BUN: 15 mg/dL (ref 6–20)
CO2: 26 mmol/L (ref 22–32)
Calcium: 8.8 mg/dL — ABNORMAL LOW (ref 8.9–10.3)
Chloride: 101 mmol/L (ref 101–111)
Creatinine, Ser: 1.33 mg/dL — ABNORMAL HIGH (ref 0.44–1.00)
GFR calc Af Amer: 43 mL/min — ABNORMAL LOW (ref >60)
GFR calc non Af Amer: 37 mL/min — ABNORMAL LOW (ref >60)
Glucose, Bld: 320 mg/dL — ABNORMAL HIGH (ref 65–99)
Potassium: 4.5 mmol/L (ref 3.5–5.1)
Sodium: 135 mmol/L (ref 135–145)

## 2015-11-05 LAB — I-STAT TROPONIN, ED: Troponin i, poc: 0 ng/mL (ref 0.00–0.08)

## 2015-11-05 LAB — PROTIME-INR
INR: 2.07 — ABNORMAL HIGH (ref 0.00–1.49)
Prothrombin Time: 23.1 seconds — ABNORMAL HIGH (ref 11.6–15.2)

## 2015-11-05 MED ORDER — ALBUTEROL SULFATE (2.5 MG/3ML) 0.083% IN NEBU
5.0000 mg | INHALATION_SOLUTION | Freq: Once | RESPIRATORY_TRACT | Status: AC
  Administered 2015-11-05: 5 mg via RESPIRATORY_TRACT

## 2015-11-05 MED ORDER — ALBUTEROL SULFATE (2.5 MG/3ML) 0.083% IN NEBU
INHALATION_SOLUTION | RESPIRATORY_TRACT | Status: AC
  Filled 2015-11-05: qty 6

## 2015-11-05 NOTE — ED Notes (Signed)
Pt c/o shortness of breath, worse when laying down with int chest pain. Reports hx CHF.

## 2015-11-05 NOTE — ED Notes (Signed)
Pt came up to nurse first and advised they were going to leave AMA. Pt was then seen walking out the door.

## 2015-11-15 ENCOUNTER — Other Ambulatory Visit: Payer: Self-pay | Admitting: *Deleted

## 2015-11-15 DIAGNOSIS — G894 Chronic pain syndrome: Secondary | ICD-10-CM

## 2015-11-15 MED ORDER — OXYCODONE-ACETAMINOPHEN 10-325 MG PO TABS: 1.0000 | ORAL_TABLET | Freq: Four times a day (QID) | ORAL | Status: DC | PRN

## 2015-11-15 NOTE — Telephone Encounter (Signed)
Patient requested and will pick up 

## 2015-11-16 ENCOUNTER — Telehealth: Payer: Self-pay | Admitting: Adult Health

## 2015-11-16 ENCOUNTER — Ambulatory Visit (INDEPENDENT_AMBULATORY_CARE_PROVIDER_SITE_OTHER): Payer: PPO | Admitting: Internal Medicine

## 2015-11-16 ENCOUNTER — Encounter: Payer: Self-pay | Admitting: Internal Medicine

## 2015-11-16 VITALS — BP 126/74 | HR 97

## 2015-11-16 DIAGNOSIS — J45991 Cough variant asthma: Secondary | ICD-10-CM | POA: Diagnosis not present

## 2015-11-16 MED ORDER — PREDNISONE 10 MG PO TABS: ORAL_TABLET | ORAL | Status: DC

## 2015-11-16 NOTE — Patient Instructions (Addendum)
Prednisone 10 mg take  4 each am x 2 days,   2 each am x 2 days,  1 each am x 2 days and stop   Symbicort 160 Take 2 puffs first thing in am and then another 2 puffs about 12 hours later.   Only use your albuterol as a rescue medication to be used if you can't catch your breath by resting or doing a relaxed purse lip breathing pattern.  - The less you use it, the better it will work when you need it. - Ok to use up to 2 puffs  every 4 hours if you must but call for immediate appointment if use goes up over your usual need - Don't leave home without it !!  (think of it like the spare tire for your car)    Work on inhaler technique:  relax and gently blow all the way out then take a nice smooth deep breath back in, triggering the inhaler at same time you start breathing in.  Hold for up to 5 seconds if you can. Blow out thru nose. Rinse and gargle with water when done      Pantoprazole (protonix) 40 mg   Take  30-60 min before first meal of the day and Pepcid (famotidine)  20 mg one @  bedtime until return to office - this is the best way to tell whether stomach acid is contributing to your problem.    See Tammy NP w/in 2 weeks with all your medications, even over the counter meds, separated in two separate bags, the ones you take no matter what (the ones you take no matter what) vs the ones you stop once you feel better and take only as needed when you feel you need them.   Tammy  will generate for you a new user friendly medication calendar that will put [place] all on the same page re: your medication use.     Without this process, it simply isn't possible to assure that we are providing  your outpatient care  with  the attention to detail we feel you deserve.   If we cannot assure that you're getting that kind of care,  then we cannot manage your problem effectively from this clinic.  Once you have seen Tammy and we are sure that we're all on the same page with your medication use she will arrange  follow up with me.  consider trial of singulair/ allergy w/u next ov

## 2015-11-16 NOTE — Telephone Encounter (Signed)
Pt requesting appt.  Last seen 2015 with [place].  Scheduled at 3:30 with MW today.  Nothing further needed.

## 2015-11-16 NOTE — Progress Notes (Signed)
Subjective:     Patient ID: [name], female   DOB: 04/07/1935,   MRN: [id number]  HPI   89 yowf never smoker obesity / could not tol cpap when last seen by [facility] 02/2014 and rx with symbicort since then and good control of presumably  asthma until  mid June 2017 cough/ breathing > UC rx prednisone some better then worse again.  llimited by knees to w/c x 2015     11/16/2015 1st [facility] office visit/ Kiely Cousar   Chief Complaint  Patient presents with  . Pulmonary Consult    Self referral. Pt c/o SOB for the past 3 wks. She also c/o prod cough- clear today, but had coughed up "black strings" approx 1 wk ago. She states that she is SOB "all the time". She can not lie flat comfortably.    was able to lie flatter after prednisone x one week then gradually worse p finished prednisone assoc with hacking cough/ sub wheeze/ nasal and chest congestions  No obvious patterns/ triggers in terms of day to day or daytime variability or assoc   mucus plugs or hemoptysis or cp or chest tightness,  or overt   hb symptoms. No unusual exp hx or h/o childhood pna/ asthma or knowledge of premature birth.  Sleeping ok at 30 degrees now without nocturnal  or early am exacerbation  of respiratory  c/o's or need for noct saba. Also denies any obvious fluctuation of symptoms with weather or environmental changes or other aggravating or alleviating factors except as outlined above   Current Medications, Allergies, Complete Past Medical History, Past Surgical History, Family History, and Social History were reviewed in [employer] record.  ROS  The following are not active complaints unless bolded sore throat, dysphagia, dental problems, itching, sneezing,  nasal congestion or excess/ purulent secretions, ear ache,   fever, chills, sweats, unintended wt loss, classically pleuritic or exertional cp,  orthopnea pnd or leg swelling, presyncope, palpitations, abdominal pain, anorexia,  nausea, vomiting, diarrhea  or change in bowel or bladder habits, change in stools or urine, dysuria,hematuria,  rash, arthralgias, visual complaints, headache, numbness, weakness or ataxia or problems with walking or coordination,  change in mood/affect or memory.        Review of Systems     Objective:   Physical Exam amb obese w/c bound bf nad   Wt Readings from Last 3 Encounters:  11/01/15 343 lb 9.6 oz (155.856 kg)  10/11/15 340 lb (154.223 kg)  09/24/15 344 lb (156.037 kg)    Vital signs reviewed    HEENT: nl dentition, turbinates, and oropharynx. Nl external ear canals without cough reflex   NECK :  without JVD/Nodes/TM/ nl carotid upstrokes bilaterally   LUNGS: no acc muscle use,  Nl contour chest which is clear to A and P bilaterally with distant wheeze bilaterally  CV:  RRR  no s3 or murmur or increase in P2, no edema   ABD:  soft and nontender with nl inspiratory excursion in the supine position. No bruits or organomegaly, bowel sounds nl  MS:  Nl gait/ ext warm without deformities, calf tenderness, cyanosis or clubbing No obvious joint restrictions   SKIN: warm and dry without lesions    NEURO:  alert, approp, nl sensorium with  no motor deficits      I personally reviewed images and agree with radiology impression as follows:  CXR:  6/30/317        Assessment:

## 2015-11-17 DIAGNOSIS — J45991 Cough variant asthma: Secondary | ICD-10-CM | POA: Insufficient documentation

## 2015-11-17 NOTE — Assessment & Plan Note (Addendum)
DDX of  difficult airways management almost all start with A and  include Adherence, Ace Inhibitors, Acid Reflux, Active Sinus Disease, Alpha 1 Antitripsin deficiency, Anxiety masquerading as Airways dz,  ABPA,  Allergy(esp in young), Aspiration (esp in elderly), Adverse effects of meds,  Active smokers, A bunch of PE's (a small clot burden can't cause this syndrome unless there is already severe underlying pulm or vascular dz with poor reserve) plus two Bs  = Bronchiectasis and Beta blocker use..and one C= CHF  Adherence is always the initial "prime suspect" and is a multilayered concern that requires a "trust but verify" approach in every patient - starting with knowing how to use medications, especially inhalers, correctly, keeping up with refills and understanding the fundamental difference between maintenance and prns vs those medications only taken for a very short course and then stopped and not refilled.  - very complex medical rx .  To keep things simple, I have asked the patient to first separate medicines that are perceived as maintenance, that is to be taken daily "no matter what", from those medicines that are taken on only on an as-needed basis and I have given the patient examples of both, and then return to see our NP to generate a  detailed  medication calendar which should be followed until the next physician sees the patient and updates it.   - - The proper method of use, as well as anticipated side effects, of a metered-dose inhaler are discussed and demonstrated to the patient. Improved effectiveness after extensive coaching during this visit to a level of approximately 50 % from a baseline of 25 % > continue symb 160 2bid  For now as I don't really think the asthma component is that bad and has spacer but not using so either use it regularly and bring it to office to verify proper use or we need to verify she's using this device or provide neb as dpi not a good choice with predominantly  coughing features  ? Acid (or non-acid) GERD > always difficult to exclude as up to 75% of pts in some series report no assoc GI/ Heartburn symptoms> rec max (24h)  acid suppression and diet restrictions/ reviewed and instructions given in writing.   ? Allergy > Prednisone 10 mg take  4 each am x 2 days,   2 each am x 2 days,  1 each am x 2 days and stop and consider trial of singulair/ allergy w/u next ov  ? PE > high risk due to MO but already on coumadin with therapeutic INR during flares at 2.5 on 11/01/15   ? BB > probably not an issue on low dose lopressor    Total time devoted to counseling  = 35/40m review case with pt/fm discussion of options/alternatives/ personally creating written instructions  in presence of pt  then going over those specific  Instructions directly with the pt including how to use all of the meds but in particular covering each new medication in detail and the difference between the maintenance/automatic meds and the prns using an action plan format for the latter.

## 2015-11-17 NOTE — Assessment & Plan Note (Signed)
  Lab Results  Component Value Date   TSH 0.733 06/25/2014     Contributing to gerd tendency/ doe/reviewed the need and the process to achieve and maintain neg calorie balance > defer f/u primary care including intermittently monitoring thyroid status

## 2015-11-30 ENCOUNTER — Other Ambulatory Visit: Payer: Self-pay | Admitting: *Deleted

## 2015-12-01 ENCOUNTER — Telehealth: Payer: Self-pay | Admitting: Hematology

## 2015-12-01 NOTE — Telephone Encounter (Signed)
lvm to inform pt of r/s appts on 8/7 at 1 pm per pof

## 2015-12-06 ENCOUNTER — Telehealth: Payer: Self-pay

## 2015-12-06 ENCOUNTER — Ambulatory Visit (INDEPENDENT_AMBULATORY_CARE_PROVIDER_SITE_OTHER): Payer: PPO | Admitting: Pharmacotherapy

## 2015-12-06 ENCOUNTER — Encounter: Payer: Self-pay | Admitting: Pharmacotherapy

## 2015-12-06 VITALS — BP 136/80 | HR 85 | Temp 97.8°F | Ht 67.0 in | Wt 347.0 lb

## 2015-12-06 DIAGNOSIS — I2782 Chronic pulmonary embolism: Secondary | ICD-10-CM

## 2015-12-06 DIAGNOSIS — E1122 Type 2 diabetes mellitus with diabetic chronic kidney disease: Secondary | ICD-10-CM

## 2015-12-06 DIAGNOSIS — E1165 Type 2 diabetes mellitus with hyperglycemia: Secondary | ICD-10-CM | POA: Diagnosis not present

## 2015-12-06 DIAGNOSIS — Z794 Long term (current) use of insulin: Secondary | ICD-10-CM

## 2015-12-06 DIAGNOSIS — Z7901 Long term (current) use of anticoagulants: Secondary | ICD-10-CM | POA: Diagnosis not present

## 2015-12-06 LAB — HEMOGLOBIN A1C
Hgb A1c MFr Bld: 10 % — ABNORMAL HIGH (ref <5.7)
Mean Plasma Glucose: 240 mg/dL

## 2015-12-06 LAB — POCT INR: INR: 3

## 2015-12-06 MED ORDER — WARFARIN SODIUM 5 MG PO TABS: ORAL_TABLET | ORAL | 5 refills | Status: DC

## 2015-12-06 NOTE — Telephone Encounter (Signed)
Continue HFA use and may also use mucinex BID

## 2015-12-06 NOTE — Patient Instructions (Addendum)
INR 3.0  Continue Coumadin 7.5mg  ( 1 & 1/2 tablets) daily except 5mg  (1 tablet) Tuesdays and Thursdays  Try Flonase / Nasonex / Rhinocort nasal spray  - 2 sprays into each nostril daily

## 2015-12-06 NOTE — Progress Notes (Signed)
   Subjective:    Patient ID: [name], female    DOB: 12-19-1934, 81 y.o.   MRN: [id number]  HPI Last INR was OK at 2.5 Has been complaining of congestion.   Denies missed doses Denies unusual bleeding or bruising Denies CP, falls Consistent with vitamin K  BG:  159 mg/dl today Last A1C was 11.9% in March 2017 She complains of peripheral neuropathy.   Review of Systems  HENT: Positive for congestion. Negative for nosebleeds.   Eyes: Positive for itching.  Respiratory: Positive for shortness of breath.   Cardiovascular: Negative for chest pain.  Gastrointestinal: Negative for anal bleeding and blood in stool.  Genitourinary: Negative for hematuria.  Hematological: Does not bruise/bleed easily.       Objective:   Physical Exam  Constitutional: She is oriented to person, place, and time. She appears well-developed and well-nourished.  HENT:  Right Ear: External ear normal.  Left Ear: External ear normal.  Cardiovascular: Normal rate, regular rhythm and normal heart sounds.   Pulmonary/Chest: Effort normal and breath sounds normal.  Neurological: She is alert and oriented to person, place, and time.  Skin: Skin is warm and dry.  Psychiatric: She has a normal mood and affect. Her behavior is normal. Judgment and thought content normal.  Vitals reviewed.  BP: 136/80  HR:  85  Wt:  347 INR 3.0       Assessment & Plan:  1.  INR at goal 2-3 2.  Continue Coumadin 7.5mg  QD except 5mg  T/Th 3.  A1C today 4.  Try nasal steroid daily for congestion, itchy eyes. 5.  RTC in 1 month

## 2015-12-06 NOTE — Telephone Encounter (Signed)
Patient was in today to see [name] for a PT/INR check. Patient c/o ongoing productive cough since las ER visit. Patient states at times phlegm is discolored (yellowish). Patient is using a ventolin inhaler and would like to know if Dr.Carter has additional recommendations. (Patient is also being followed by specialist)    Pending appointment 01/26/16  Please advise

## 2015-12-08 ENCOUNTER — Ambulatory Visit (INDEPENDENT_AMBULATORY_CARE_PROVIDER_SITE_OTHER): Payer: PPO | Admitting: Adult Health

## 2015-12-08 ENCOUNTER — Other Ambulatory Visit (INDEPENDENT_AMBULATORY_CARE_PROVIDER_SITE_OTHER): Payer: PPO

## 2015-12-08 ENCOUNTER — Encounter: Payer: Self-pay | Admitting: Adult Health

## 2015-12-08 DIAGNOSIS — J45991 Cough variant asthma: Secondary | ICD-10-CM | POA: Diagnosis not present

## 2015-12-08 LAB — CBC WITH DIFFERENTIAL/PLATELET
Basophils Absolute: 0 10*3/uL (ref 0.0–0.1)
Basophils Relative: 0.3 % (ref 0.0–3.0)
Eosinophils Absolute: 0.3 10*3/uL (ref 0.0–0.7)
Eosinophils Relative: 2.5 % (ref 0.0–5.0)
HCT: 30.3 % — ABNORMAL LOW (ref 36.0–46.0)
Hemoglobin: 9.8 g/dL — ABNORMAL LOW (ref 12.0–15.0)
Lymphocytes Relative: 25.8 % (ref 12.0–46.0)
Lymphs Abs: 2.8 10*3/uL (ref 0.7–4.0)
MCHC: 32.3 g/dL (ref 30.0–36.0)
MCV: 82.8 fl (ref 78.0–100.0)
Monocytes Absolute: 0.5 10*3/uL (ref 0.1–1.0)
Monocytes Relative: 4.5 % (ref 3.0–12.0)
Neutro Abs: 7.3 10*3/uL (ref 1.4–7.7)
Neutrophils Relative %: 66.9 % (ref 43.0–77.0)
Platelets: 318 10*3/uL (ref 150.0–400.0)
RBC: 3.66 Mil/uL — ABNORMAL LOW (ref 3.87–5.11)
RDW: 16.1 % — ABNORMAL HIGH (ref 11.5–15.5)
WBC: 10.9 10*3/uL — ABNORMAL HIGH (ref 4.0–10.5)

## 2015-12-08 LAB — NITRIC OXIDE: Nitric Oxide: 32

## 2015-12-08 MED ORDER — MONTELUKAST SODIUM 10 MG PO TABS: 10.0000 mg | ORAL_TABLET | Freq: Every day | ORAL | 11 refills | Status: DC

## 2015-12-08 NOTE — Patient Instructions (Addendum)
Labs today  Follow med calendar closely and bring to each visit.  Add Zyrtec 10mg  At bedtime   Add Singulair 10mg  At bedtime   follow up Dr. Melvyn Novas  In 6-8 weeks with PFT and As needed   Please contact office for sooner follow up if symptoms do not improve or worsen or seek emergency care

## 2015-12-08 NOTE — Telephone Encounter (Signed)
Left message on voicemail for patient to return call when available   

## 2015-12-08 NOTE — Progress Notes (Signed)
Subjective:     Patient ID: [name], female   DOB: 1934/09/19,   MRN: [id number]  HPI 47 yowf never smoker obesity / could not tol cpap when last seen by [facility] 02/2014 and rx with symbicort since then and good control of presumably  asthma until  mid June 2017 cough/ breathing > UC rx prednisone some better then worse again.  llimited by knees to w/c x 2015     11/16/2015 1st  Pulmonary office visit/ [facility]   Chief Complaint  Patient presents with  . Pulmonary Consult    Self referral. Pt c/o SOB for the past 3 wks. She also c/o prod cough- clear today, but had coughed up "black strings" approx 1 wk ago. She states that she is SOB "all the time". She can not lie flat comfortably.    was able to lie flatter after prednisone x one week then gradually worse p finished prednisone assoc with hacking cough/ sub wheeze/ nasal and chest congestions >>Pred taper , PPI , Pepcid rx   12/08/2015 Follow Up : SOB and med calendar  Pt returns for 3 week follow up , seen last ov for pulmonary consult for dyspnea and cough  She was given prednisone taper for possible asthma flare .  She returns feeling better with less cough and dyspnea but not totally resolved . Still has a lot of sinus drainage and throat clearing. Nasal stuffiness.  FENO today is 34 We reviewed her meds and organized them into a med calendar . Appears to be taking meds correctly.  Daughter helps her with her meds.  She denies chest pain, orthopnea, edema or fever.   Past Medical History:  Diagnosis Date  . Allergy    takes Mucinex daily as needed  . Anemia, unspecified   . Anxiety    takes Clonazepam daily as needed  . Arthritis   . CHF (congestive heart failure) (HCC)    takes Furosemide daily  . Coronary artery disease    a. s/p IMI 2004 tx with BMS to RCA;  b. s/p Promus DES to RCA 2/12 (cath: LM 20-30%, pLAD 20-30%, mLAD 50%, RI 30%, pRCA 60%, mRCA 90% - tx with PCI);  c. myoview 1/07: EF 49%, inf scar, no isch   . Coronary atherosclerosis of native coronary artery   . Depression    takes Cymbalta daily  . Diabetes mellitus    insulin daily  . Dysuria   . GERD (gastroesophageal reflux disease)    takes Protonix daily  . Gout, unspecified    takes Colchicine daily  . Headache    occasionally  . History of blood clots about 26yrs ago   in legs-takes Coumadin   . Hyperlipidemia    takes Pravastatin daily  . Hypertension    takes Imdur and Metoprolol daily  . Insomnia   . Joint pain   . Joint swelling   . Myocardial infarction ([place]) 2004  . Numbness   . Obstructive sleep apnea   . Osteoarthritis   . Osteoarthritis   . Osteoarthrosis, unspecified whether generalized or localized, lower leg   . Pain, chronic   . Polymyalgia rheumatica ([place])   . Pulmonary emboli ([place]) 9/13   felt to need lifelong anticoagulation  . Shortness of breath dyspnea    with exertion and has Albuterol inhaler prn  . Type II or unspecified type diabetes mellitus without mention of complication, uncontrolled   . Urinary incontinence    takes Linzess daily  .  Vertigo    hx of;was taking Meclizine if needed   Current Outpatient Prescriptions on File Prior to Visit  Medication Sig Dispense Refill  . albuterol (PROVENTIL HFA;VENTOLIN HFA) 108 (90 Base) MCG/ACT inhaler Inhale 1-2 puffs into the lungs every 6 (six) hours as needed for wheezing or shortness of breath. 1 Inhaler 6  . anastrozole (ARIMIDEX) 1 MG tablet Take 1 tablet (1 mg total) by mouth daily. 90 tablet 4  . budesonide-formoterol (SYMBICORT) 160-4.5 MCG/ACT inhaler Inhale 2 puffs into the lungs 2 (two) times daily as needed (shortness of breath). 1 Inhaler 6  . Calcium Carbonate Antacid (TUMS PO) Take 3 tablets by mouth daily as needed (acid reflux).    . cholecalciferol (VITAMIN D) 1000 units tablet Take 1,000 Units by mouth daily.    . clonazePAM (KLONOPIN) 1 MG tablet TAKE TWO TABLETS BY MOUTH AT BEDTIME AS NEEDED FOR SLEEP 180 tablet 0  .  diphenhydrAMINE (BENADRYL) 25 MG tablet Take 50 mg by mouth daily as needed for itching. Reported on 11/16/2015    . DULoxetine (CYMBALTA) 60 MG capsule TAKE ONE CAPSULE BY MOUTH ONCE DAILY FOR ANXIETY 90 capsule 1  . insulin aspart (NOVOLOG) 100 UNIT/ML injection Inject 25 Units into the skin 3 (three) times daily with meals. 10 mL 6  . Insulin Glargine (TOUJEO SOLOSTAR) 300 UNIT/ML SOPN Inject 60 Units into the skin 2 (two) times daily.    . isosorbide mononitrate (IMDUR) 60 MG 24 hr tablet TAKE ONE TABLET BY MOUTH ONCE DAILY 90 tablet 1  . Lancets (ONETOUCH ULTRASOFT) lancets Use to test blood sugar Dx: E11.9 100 each 12  . meclizine (ANTIVERT) 25 MG tablet Take 1 tablet (25 mg total) by mouth 3 (three) times daily. 90 tablet 0  . metoprolol succinate (TOPROL-XL) 25 MG 24 hr tablet TAKE ONE TABLET BY MOUTH ONCE DAILY FOR BLOOD PRESSURE 90 tablet 1  . nitroGLYCERIN (NITROSTAT) 0.4 MG SL tablet Take one tablet under the tongue every 5 minutes as needed for chest pain 50 tablet 0  . ONETOUCH VERIO test strip USE AS DIRECTED 100 each 3  . oxyCODONE-acetaminophen (PERCOCET) 10-325 MG tablet Take 1 tablet by mouth every 6 (six) hours as needed for pain. 120 tablet 0  . pantoprazole (PROTONIX) 40 MG tablet TAKE ONE TABLET BY MOUTH ONCE DAILY 90 tablet 1  . saccharomyces boulardii (FLORASTOR) 250 MG capsule Take 1 capsule (250 mg total) by mouth 2 (two) times daily. 28 capsule 0  . sertraline (ZOLOFT) 25 MG tablet Take 1 tablet (25 mg total) by mouth at bedtime. 90 tablet 3  . Spacer/Aero-Holding Chambers (AEROCHAMBER PLUS FLO-VU LARGE) MISC 1 each by Other route once. 1 each 0  . temazepam (RESTORIL) 30 MG capsule TAKE ONE CAPSULE BY MOUTH AT BEDTIME AS NEEDED FOR SLEEP 90 capsule 0  . triamcinolone cream (KENALOG) 0.1 % Apply 1 application topically 2 (two) times daily. 30 g 0  . ULORIC 40 MG tablet TAKE ONE TABLET BY MOUTH ONCE DAILY 30 tablet 5  . warfarin (COUMADIN) 5 MG tablet Take 1 & 1/2  tablets  daily except 1 tablet on Sundays, Tuesdays, and Thursdays 45 tablet 5   No current facility-administered medications on file prior to visit.     Current Medications, Allergies, Complete Past Medical History, Past Surgical History, Family History, and Social History were reviewed in [employer] record.  ROS  The following are not active complaints unless bolded sore throat, dysphagia, dental problems, itching, sneezing,  excess/ purulent secretions, ear ache,   fever, chills, sweats, unintended wt loss, classically pleuritic or exertional cp,  orthopnea pnd or leg swelling, presyncope, palpitations, abdominal pain, anorexia, nausea, vomiting, diarrhea  or change in bowel or bladder habits, change in stools or urine, dysuria,hematuria,  rash, arthralgias, visual complaints, headache, numbness, weakness or ataxia or problems with walking or coordination,  change in mood/affect or memory.            Objective:   Physical Exam amb obese w/c bound bf nad  Vitals:   12/08/15 0923  BP: 126/78  Pulse: 83  Temp: 98.1 F (36.7 C)  TempSrc: Oral  SpO2: 98%  Weight: (!) 344 lb (156 kg)  Height: 5\' 7"  (1.702 m)     Vital signs reviewed    HEENT: nl dentition, turbinates, and oropharynx. Nl external ear canals without cough reflex   NECK :  without JVD/Nodes/TM/ nl carotid upstrokes bilaterally   LUNGS: no acc muscle use,  Nl contour chest which is clear to A and P bilaterally   CV:  RRR  no s3 or murmur or increase in P2, no edema   ABD:  soft and nontender with nl inspiratory excursion in the supine position. No bruits or organomegaly, bowel sounds nl  MS:  Nl gait/ ext warm without deformities, calf tenderness, cyanosis or clubbing No obvious joint restrictions   SKIN: warm and dry without lesions    NEURO:  alert, approp, nl sensorium with  no motor deficits      CXR:  6/30/317  NAD     Francelia Mclaren NP-C  [facility]   12/08/2015

## 2015-12-08 NOTE — Addendum Note (Signed)
Addended by: Osa Craver on: 12/08/2015 12:18 PM   Modules accepted: Orders

## 2015-12-08 NOTE — Assessment & Plan Note (Signed)
Recent flare with persistent AR sx  Cont to control for triggers Add singulair and zyrtec Check Ige /RAST , CBC w/ diff .  Patient's medications were reviewed today and patient education was given. Computerized medication calendar was adjusted/completed    Plan  Labs today  Follow med calendar closely and bring to each visit.  Add Zyrtec 10mg  At bedtime   Add Singulair 10mg  At bedtime   follow up Dr. Melvyn Novas  In 6-8 weeks and As needed   Please contact office for sooner follow up if symptoms do not improve or worsen or seek emergency care

## 2015-12-09 LAB — RESPIRATORY ALLERGY PROFILE REGION II ~~LOC~~
Allergen, A. alternata, m6: <0.1 kU/L
Allergen, C. Herbarum, M2: <0.1 kU/L
Allergen, Cedar tree, t12: <0.1 kU/L
Allergen, Comm Silver Birch, t9: <0.1 kU/L
Allergen, Cottonwood, t14: <0.1 kU/L
Allergen, D pternoyssinus,d7: <0.1 kU/L
Allergen, Mouse Urine Protein, e78: <0.1 kU/L
Allergen, Mulberry, t76: <0.1 kU/L
Allergen, Oak,t7: <0.1 kU/L
Allergen, P. notatum, m1: <0.1 kU/L
Aspergillus fumigatus, m3: <0.1 kU/L
Bermuda Grass: <0.1 kU/L
Box Elder IgE: <0.1 kU/L
Cat Dander: <0.1 kU/L
Cockroach: <0.1 kU/L
Common Ragweed: <0.1 kU/L
D. farinae: <0.1 kU/L
Dog Dander: 0.14 kU/L — ABNORMAL HIGH
Elm IgE: <0.1 kU/L
IgE (Immunoglobulin E), Serum: 63 kU/L (ref <115)
Johnson Grass: <0.1 kU/L
Pecan/Hickory Tree IgE: <0.1 kU/L
Rough Pigweed  IgE: <0.1 kU/L
Sheep Sorrel IgE: <0.1 kU/L
Timothy Grass: <0.1 kU/L

## 2015-12-09 NOTE — Progress Notes (Signed)
Chart and office note reviewed in detail  > agree with a/p as outlined    

## 2015-12-10 NOTE — Addendum Note (Signed)
Addended by: Osa Craver on: 12/10/2015 12:28 PM   Modules accepted: Orders

## 2015-12-13 ENCOUNTER — Encounter: Payer: Self-pay | Admitting: Hematology

## 2015-12-13 ENCOUNTER — Other Ambulatory Visit (HOSPITAL_BASED_OUTPATIENT_CLINIC_OR_DEPARTMENT_OTHER): Payer: PPO

## 2015-12-13 ENCOUNTER — Telehealth: Payer: Self-pay | Admitting: Hematology

## 2015-12-13 ENCOUNTER — Ambulatory Visit (HOSPITAL_BASED_OUTPATIENT_CLINIC_OR_DEPARTMENT_OTHER): Payer: PPO | Admitting: Hematology

## 2015-12-13 ENCOUNTER — Other Ambulatory Visit: Payer: Self-pay

## 2015-12-13 VITALS — BP 165/58 | HR 82 | Temp 98.6°F | Resp 19 | Ht 67.0 in | Wt 341.8 lb

## 2015-12-13 DIAGNOSIS — C50912 Malignant neoplasm of unspecified site of left female breast: Secondary | ICD-10-CM

## 2015-12-13 DIAGNOSIS — Z170 Estrogen receptor positive status [ER+]: Secondary | ICD-10-CM

## 2015-12-13 DIAGNOSIS — Z79811 Long term (current) use of aromatase inhibitors: Secondary | ICD-10-CM

## 2015-12-13 DIAGNOSIS — E559 Vitamin D deficiency, unspecified: Secondary | ICD-10-CM

## 2015-12-13 DIAGNOSIS — C50919 Malignant neoplasm of unspecified site of unspecified female breast: Secondary | ICD-10-CM

## 2015-12-13 DIAGNOSIS — C50112 Malignant neoplasm of central portion of left female breast: Secondary | ICD-10-CM

## 2015-12-13 LAB — CBC & DIFF AND RETIC
BASO%: 0.2 % (ref 0.0–2.0)
Basophils Absolute: 0 10*3/uL (ref 0.0–0.1)
EOS%: 2 % (ref 0.0–7.0)
Eosinophils Absolute: 0.2 10*3/uL (ref 0.0–0.5)
HCT: 32.5 % — ABNORMAL LOW (ref 34.8–46.6)
HGB: 10 g/dL — ABNORMAL LOW (ref 11.6–15.9)
Immature Retic Fract: 15.6 % — ABNORMAL HIGH (ref 1.60–10.00)
LYMPH%: 23.1 % (ref 14.0–49.7)
MCH: 26.5 pg (ref 25.1–34.0)
MCHC: 30.8 g/dL — ABNORMAL LOW (ref 31.5–36.0)
MCV: 86.2 fL (ref 79.5–101.0)
MONO#: 0.6 10*3/uL (ref 0.1–0.9)
MONO%: 5 % (ref 0.0–14.0)
NEUT#: 8.3 10*3/uL — ABNORMAL HIGH (ref 1.5–6.5)
NEUT%: 69.7 % (ref 38.4–76.8)
Platelets: 272 10*3/uL (ref 145–400)
RBC: 3.77 10*6/uL (ref 3.70–5.45)
RDW: 15.4 % — ABNORMAL HIGH (ref 11.2–14.5)
Retic %: 1.98 % (ref 0.70–2.10)
Retic Ct Abs: 74.65 10*3/uL (ref 33.70–90.70)
WBC: 11.9 10*3/uL — ABNORMAL HIGH (ref 3.9–10.3)
lymph#: 2.8 10*3/uL (ref 0.9–3.3)

## 2015-12-13 LAB — COMPREHENSIVE METABOLIC PANEL
ALT: 16 U/L (ref 0–55)
AST: 20 U/L (ref 5–34)
Albumin: 2.8 g/dL — ABNORMAL LOW (ref 3.5–5.0)
Alkaline Phosphatase: 78 U/L (ref 40–150)
Anion Gap: 10 mEq/L (ref 3–11)
BUN: 19 mg/dL (ref 7.0–26.0)
CO2: 27 mEq/L (ref 22–29)
Calcium: 9.3 mg/dL (ref 8.4–10.4)
Chloride: 103 mEq/L (ref 98–109)
Creatinine: 1.4 mg/dL — ABNORMAL HIGH (ref 0.6–1.1)
EGFR: 42 mL/min/{1.73_m2} — ABNORMAL LOW (ref >90)
Glucose: 223 mg/dl — ABNORMAL HIGH (ref 70–140)
Potassium: 4.9 mEq/L (ref 3.5–5.1)
Sodium: 139 mEq/L (ref 136–145)
Total Bilirubin: 0.55 mg/dL (ref 0.20–1.20)
Total Protein: 7.8 g/dL (ref 6.4–8.3)

## 2015-12-13 NOTE — Progress Notes (Signed)
Called spoke with patient, advised of lab results / recs as stated by TP.  Pt verbalized her understanding and denied any questions. 

## 2015-12-13 NOTE — Telephone Encounter (Signed)
Gave pt cal & avs °

## 2015-12-14 LAB — VITAMIN D 25 HYDROXY (VIT D DEFICIENCY, FRACTURES): Vitamin D, 25-Hydroxy: 30.5 ng/mL (ref 30.0–100.0)

## 2015-12-15 ENCOUNTER — Other Ambulatory Visit: Payer: Self-pay | Admitting: *Deleted

## 2015-12-15 DIAGNOSIS — G894 Chronic pain syndrome: Secondary | ICD-10-CM

## 2015-12-15 MED ORDER — OXYCODONE-ACETAMINOPHEN 10-325 MG PO TABS: 1.0000 | ORAL_TABLET | Freq: Four times a day (QID) | ORAL | 0 refills | Status: DC | PRN

## 2015-12-28 ENCOUNTER — Other Ambulatory Visit: Payer: Self-pay | Admitting: Internal Medicine

## 2015-12-28 DIAGNOSIS — E1165 Type 2 diabetes mellitus with hyperglycemia: Principal | ICD-10-CM

## 2015-12-28 DIAGNOSIS — Z794 Long term (current) use of insulin: Principal | ICD-10-CM

## 2015-12-28 DIAGNOSIS — E1122 Type 2 diabetes mellitus with diabetic chronic kidney disease: Secondary | ICD-10-CM

## 2015-12-29 ENCOUNTER — Telehealth: Payer: Self-pay

## 2015-12-29 NOTE — Telephone Encounter (Signed)
Patient will further follow-up with specialist

## 2015-12-29 NOTE — Telephone Encounter (Signed)
Called patient to confirm that she requested diabetic testing supplies from [place]. Patient responded: Yes, I would like for my supplies to come through the mail and I filled out a form to be sent to my doctor.   Form completed and faxed back to [phone]

## 2016-01-02 NOTE — Progress Notes (Signed)
.    Hematology oncology Clinic FOllowup Note  Date of service .12/13/2015   Patient Care Team: Gildardo Cranker, DO as PCP - General (Internal Medicine) Renella Cunas, MD (Inactive) as Consulting Physician (Cardiology) [name], MD as Consulting Physician (Ophthalmology) [name], MD as Consulting Physician (General Surgery)  CHIEF COMPLAINTS/PURPOSE OF CONSULTATION:  F/u for breast cancer.  DIAGNOSIS: left-sided presumed stage IA  (pT1c,pNx[cN0], cM0) invasive ductal carcinoma grade 2 out of 3, strongly ER +100%, strongly PR +100% and HER-2/neu negative. Given her high risk cardiac status lumpectomy had to be done under local anesthesia and sentinel lymph node biopsy was not possible. She has accompanying DCIS of intermediate grade present at margins. ECOG performance status is 3. -Had a mammogram/tomosynthesis done on 07/29/2015 which shows residual suspicious calcification in the left breast postero-medial to the biopsy site. Has known residual DCIS.  No evidence of new malignancy.  No evidence of right breast malignancy.   CURRENT TREATMENT: Arimidex 30m po daily  HISTORY OF PRESENTING ILLNESS: -please see my initial consultation for details of intial presentation  INTERVAL HISTORY  Mrs. CWeedonis here for her scheduled 3 month followup of her breast cancer.  She notes that she has been compliant with her Arimidex and has not had any prohibitive symptoms. Has not noted any new breast discomfort/skin changes or obvious lumps. Blood sugars continue to remain uncontrolled. No other acute new symptoms.   MEDICAL HISTORY:  Past Medical History:  Diagnosis Date  . Allergy    takes Mucinex daily as needed  . Anemia, unspecified   . Anxiety    takes Clonazepam daily as needed  . Arthritis   . CHF (congestive heart failure) (HCC)    takes Furosemide daily  . Coronary artery disease    a. s/p IMI 2004 tx with BMS to RCA;  b. s/p Promus DES to RCA 2/12 (cath: LM 20-30%,  pLAD 20-30%, mLAD 50%, RI 30%, pRCA 60%, mRCA 90% - tx with PCI);  c. myoview 1/07: EF 49%, inf scar, no isch  . Coronary atherosclerosis of native coronary artery   . Depression    takes Cymbalta daily  . Diabetes mellitus    insulin daily  . Dysuria   . GERD (gastroesophageal reflux disease)    takes Protonix daily  . Gout, unspecified    takes Colchicine daily  . Headache    occasionally  . History of blood clots about 534yrago   in legs-takes Coumadin   . Hyperlipidemia    takes Pravastatin daily  . Hypertension    takes Imdur and Metoprolol daily  . Insomnia   . Joint pain   . Joint swelling   . [place]ocardial infarction ([facility]  . Numbness   . Obstructive sleep apnea   . Osteoarthritis   . Osteoarthritis   . Osteoarthrosis, unspecified whether generalized or localized, lower leg   . Pain, chronic   . Polymyalgia rheumatica ([facility]  . Pulmonary emboli ([facility]9/13   felt to need lifelong anticoagulation  . Shortness of breath dyspnea    with exertion and has Albuterol inhaler prn  . Type II or unspecified type diabetes mellitus without mention of complication, uncontrolled   . Urinary incontinence    takes Linzess daily  . Vertigo    hx of;was taking Meclizine if needed    SURGICAL HISTORY: Past Surgical History:  Procedure Laterality Date  . ABDOMINAL HYSTERECTOMY     partial  . APPENDECTOMY    .  blood clots/legs and lungs  2013  . BREAST LUMPECTOMY WITH RADIOACTIVE SEED LOCALIZATION Left 11/05/2014   Procedure: LEFT BREAST LUMPECTOMY WITH RADIOACTIVE SEED LOCALIZATION;  Surgeon: [name], MD;  Location: [facility];  Service: General;  Laterality: Left;  . CARDIAC CATHETERIZATION    . COLONOSCOPY    . CORONARY ANGIOPLASTY  2  . EXCISION OF SKIN TAG Right 11/05/2014   Procedure: EXCISION OF RIGHT EYELID SKIN TAG;  Surgeon: [name], MD;  Location: [facility];  Service: General;  Laterality: Right;  . EYE SURGERY Bilateral    cataract   . GASTRIC BYPASS   1977    reversed in 1979, [place] N/A 06/29/2014   Procedure: LEFT HEART CATHETERIZATION WITH CORONARY ANGIOGRAM;  Surgeon: Troy Sine, MD;  Location: [facility] CATH LAB;  Service: Cardiovascular;  Laterality: N/A;  . MI with stent placement  2004    SOCIAL HISTORY: Social History   Social History  . Marital status: Widowed    Spouse name: N/A  . Number of children: N/A  . Years of education: N/A   Occupational History  . retired    Social History Main Topics  . Smoking status: Never Smoker  . Smokeless tobacco: Never Used  . Alcohol use No  . Drug use: No  . Sexual activity: Not Currently   Other Topics Concern  . Not on file   Social History Narrative  . No narrative on file    FAMILY HISTORY: Family History  Problem Relation Age of Onset  . Breast cancer Mother 64  . Heart disease Mother   . Throat cancer Father   . Hypertension Father   . Arthritis Father   . Diabetes Father   . Arthritis Sister   . Obesity Sister   . Diabetes Sister   . Heart disease Cousin     ALLERGIES:  is allergic to sulfonamide derivatives and tramadol.  MEDICATIONS:  Current Outpatient Prescriptions  Medication Sig Dispense Refill  . albuterol (PROVENTIL HFA;VENTOLIN HFA) 108 (90 Base) MCG/ACT inhaler Inhale 1-2 puffs into the lungs every 6 (six) hours as needed for wheezing or shortness of breath. (Patient taking differently: Inhale 1-2 puffs into the lungs every 4 (four) hours as needed for wheezing or shortness of breath. ) 1 Inhaler 6  . anastrozole (ARIMIDEX) 1 MG tablet Take 1 tablet (1 mg total) by mouth daily. 90 tablet 4  . budesonide-formoterol (SYMBICORT) 160-4.5 MCG/ACT inhaler Inhale 2 puffs into the lungs 2 (two) times daily as needed (shortness of breath). 1 Inhaler 6  . cetirizine (ZYRTEC) 10 MG tablet Take 10 mg by mouth at bedtime.    . cholecalciferol (VITAMIN D) 1000 units tablet Take 1,000 Units by mouth  daily.    . clonazePAM (KLONOPIN) 1 MG tablet     . diphenhydrAMINE (BENADRYL) 25 MG tablet Take per bottle as needed for itching    . docusate sodium (COLACE) 100 MG capsule TAKE PER BOTTLE AS NEEDED FOR CONSTIPATION    . DULoxetine (CYMBALTA) 60 MG capsule TAKE ONE CAPSULE BY MOUTH ONCE DAILY FOR ANXIETY 90 capsule 1  . isosorbide mononitrate (IMDUR) 60 MG 24 hr tablet TAKE ONE TABLET BY MOUTH ONCE DAILY 90 tablet 1  . Lancets (ONETOUCH ULTRASOFT) lancets     . meclizine (ANTIVERT) 25 MG tablet Take 1 tablet (25 mg total) by mouth 3 (three) times daily. (Patient taking differently: Take 25 mg by mouth every 8 (eight) hours as  needed for dizziness. ) 90 tablet 0  . metoprolol succinate (TOPROL-XL) 25 MG 24 hr tablet TAKE ONE TABLET BY MOUTH ONCE DAILY FOR BLOOD PRESSURE 90 tablet 1  . montelukast (SINGULAIR) 10 MG tablet Take 1 tablet (10 mg total) by mouth at bedtime. 30 tablet 11  . Multiple Vitamins-Minerals (HAIR/SKIN/NAILS/BIOTIN PO) Take 1 tablet by mouth every evening.    . nitroGLYCERIN (NITROSTAT) 0.4 MG SL tablet Take one tablet under the tongue every 5 minutes as needed for chest pain 50 tablet 0  . NOVOLOG 100 UNIT/ML injection Inject 25 Units into the skin 3 (three) times daily with meals.    [name] NOVOLOG 100 UNIT/ML injection INJECT 25 UNITS INTO THE SKIN 3 TIMES DAILY WITH MEALS 10 vial 6  . ONETOUCH VERIO test strip     . oxyCODONE-acetaminophen (PERCOCET) 10-325 MG tablet Take 1 tablet by mouth every 6 (six) hours as needed for pain. 120 tablet 0  . pantoprazole (PROTONIX) 40 MG tablet TAKE ONE TABLET BY MOUTH ONCE DAILY (Patient taking differently: TAKE ONE TABLET BY MOUTH BEFORE BREAKFAST) 90 tablet 1  . pravastatin (PRAVACHOL) 20 MG tablet Take 20 mg by mouth daily.    . predniSONE (DELTASONE) 10 MG tablet     . sertraline (ZOLOFT) 25 MG tablet Take 1 tablet (25 mg total) by mouth at bedtime. 90 tablet 3  . sodium chloride (OCEAN) 0.65 % SOLN nasal spray RINSE AS NEEDED FOR  SINUS CONGESTION    . Spacer/Aero-Holding Chambers (AEROCHAMBER PLUS FLO-VU LARGE) MISC 1 each by Other route once. 1 each 0  . temazepam (RESTORIL) 30 MG capsule TAKE ONE CAPSULE BY MOUTH AT BEDTIME AS NEEDED FOR SLEEP 90 capsule 0  . TOUJEO SOLOSTAR 300 UNIT/ML SOPN Inject 70 Units into the skin once.    [name] ULORIC 40 MG tablet TAKE ONE TABLET BY MOUTH ONCE DAILY 30 tablet 5  . warfarin (COUMADIN) 5 MG tablet Take 1 & 1/2  tablets daily except 1 tablet on Sundays, Tuesdays, and Thursdays (Patient taking differently: TAKE PER CLINIC) 45 tablet 5   No current facility-administered medications for this visit.     REVIEW OF SYSTEMS:   Review of systems as noted above Remaining 10 point review of systems negative except as noted above.  PHYSICAL EXAMINATION: ECOG PERFORMANCE STATUS: 3 - Symptomatic, >50% confined to bed  Vitals:   12/13/15 1355  BP: (!) 165/58  Pulse: 82  Resp: 19  Temp: 98.6 F (37 C)   Filed Weights   12/13/15 1355  Weight: (!) 341 lb 12.8 oz (155 kg)   GENERAL: Pleasant African-American lady, alert, no distress and comfortable, obese. SKIN: skin color, texture, turgor are normal, no rashes or significant lesions EYES: normal, conjunctiva are pink and non-injected, sclera clear OROPHARYNX: no exudate, no erythema and lips, buccal mucosa, and tongue normal  NECK: supple, thyroid normal size, non-tender, without nodularity LYMPH:  no palpable lymphadenopathy in the cervical, axillary or inguinal LUNGS: clear to auscultation normal breathing effort.  Breast : examination done with my RN as chaperone. Bulky large breast with no overt new skin or nipple changes or discretely palpable mass noted. HEART: regular rate & rhythm and no murmurs and no lower extremity edema ABDOMEN:abdomen obese, soft, non-tender and normal bowel sounds Musculoskeletal:no cyanosis of digits and no clubbing  PSYCH: alert & oriented x 3 with fluent speech NEURO: no focal motor/sensory  deficits  LABORATORY DATA:  I have reviewed the data as listed  . CBC Latest Ref  Rng & Units 12/13/2015 12/08/2015 11/05/2015  WBC 3.9 - 10.3 10e3/uL 11.9(H) 10.9(H) 13.8(H)  Hemoglobin 11.6 - 15.9 g/dL 10.0(L) 9.8(L) 9.6(L)  Hematocrit 34.8 - 46.6 % 32.5(L) 30.3(L) 32.4(L)  Platelets 145 - 400 10e3/uL 272 318.0 323   . CMP Latest Ref Rng & Units 12/13/2015 11/05/2015 07/30/2015  Glucose 70 - 140 mg/dl 223(H) 320(H) 303(H)  BUN 7.0 - 26.0 mg/dL 19.0 15 17.8  Creatinine 0.6 - 1.1 mg/dL 1.4(H) 1.33(H) 1.3(H)  Sodium 136 - 145 mEq/L 139 135 139  Potassium 3.5 - 5.1 mEq/L 4.9 4.5 4.1  Chloride 101 - 111 mmol/L - 101 -  CO2 22 - 29 mEq/L _0 Calcium 8.4 - 10.4 mg/dL 9.3 8.8(L) 9.0  Total Protein 6.4 - 8.3 g/dL 7.8 - 7.9  Total Bilirubin 0.20 - 1.20 mg/dL 0.55 - 0.60  Alkaline Phos 40 - 150 U/L 78 - 79  AST 5 - 34 U/L 20 - 17  ALT 0 - 55 U/L 16 - 17      RADIOGRAPHIC STUDIES: I have personally reviewed the radiological images as listed and agreed with the findings in the report. CLINICAL DATA: Patient is status post surgery for breast carcinoma. Patient underwent a lumpectomy, but this was limited. Due to the patient's comorbidities she is unable to have general anesthesia for more extensive surgery. Margins were positive for DCIS. Patient is on Arimidex therapy.  EXAM: 2D DIGITAL DIAGNOSTIC BILATERAL MAMMOGRAM WITH CAD AND ADJUNCT TOMO  COMPARISON: Previous exam(s).  ACR Breast Density Category b: There are scattered areas of fibroglandular density.  FINDINGS: There are ductal type calcifications posterior and slightly medial to the lumpectomy site, stable from the most recent prior study. The lumpectomy site is in retroareolar region of the left breast associated architectural distortion reflecting postsurgical scarring. There are no discrete masses. There are no other areas of architectural distortion. There are no new suspicious calcifications.  There is no  right breast mass or suspicious calcifications. The right breast architectural distortion.  Mammographic images were processed with CAD.  IMPRESSION: 1. Residual suspicious calcifications in the left breast posterior medial to biopsy site. Patient has known residual DCIS, which is being treated with Arimidex therapy. 2. No evidence of new malignancy. No right breast malignancy.  RECOMMENDATION: 1. Diagnostic mammography in 1 year per standard post lumpectomy protocol.  I have discussed the findings and recommendations with the patient. Results were also provided in writing at the conclusion of the visit. If applicable, a reminder letter will be sent to the patient regarding the next appointment.  BI-RADS CATEGORY 6: Known biopsy-proven malignancy.   Electronically Signed  By: Lajean Manes M.D.  On: 07/29/2015 16:28   DUAL X-RAY ABSORPTIOMETRY (DXA) FOR BONE MINERAL DENSITY (02/04/2015)  FINDINGS: AP LUMBAR SPINE L1 through L4  Bone Mineral Density (BMD): 1.465 g/cm2  Young Adult T-Score: 2.2  Z-Score: 3.3  RIGHT FEMUR NECK  Bone Mineral Density (BMD): 0.741 g/cm2  Young Adult T-Score: -2.1  Z-Score: -1.0  ASSESSMENT: Patient's diagnostic category is LOW BONE MASS by WHO Criteria.  FRACTURE RISK: MODERATE   ASSESSMENT & PLAN:   Mrs. Hermida is a very wonderful 81 year old African-American female with multiple medical comorbidities as described above with   #1Left-sided presumed stage IA  (pT1c,pNx[cN0], cM0) invasive ductal carcinoma grade 2 out of 3, strongly ER +100%, strongly PR +100% and HER-2/neu negative. Given her high risk cardiac status lumpectomy had to be done under local anesthesia and sentinel lymph node biopsy was not  possible. She has accompanying DCIS of intermediate grade present at margins. ECOG performance status is 3. Dexa scan "low bone mass" per WHO.  #2 significant coronary artery disease with stress test in  February 2016 suggesting likely multivessel disease. Has uncontrolled diabetes, hypertension, dyslipidemia, untreated sleep apnea, coronary disease, severe arthritis all of which are significantly limiting her quality of life. #3 vitamin D deficiency - 25OH vitamin D level of 10, s/p high dose ergocalciferol re-placement with 25OH Vit D improvement to 30.5.  Now on vit D 2000 units daily.   Plan -no clear clinical indication for breast cancer recurrence/progression at this time. -continue Arimidex 79m po daily -on zoloft as per PCP which appears to be helping her hot flashes as well -continue vit D 2000IU daily  -atleast 1200-15084mpo calcium intake daily -MMG/tomosynthesis in 1 yr unless new symptoms in the interim. (07/2016) -continue f/u with PCP to monitor and management other multiple signiifcant medical co-morbids. -rpt DEXA scan in 01/2017 for bone health monitoirng on Arimidex.  #4 postmenopausal vaginal bleeding -patient notes that she is following up with GYN for this and was told that she has an endometrial polyp. -management per Gyn  Return to care with Dr. KaIrene Limbon 4 months with CBC, CMP  Continue follow-up with your primary care physician Dr. CaEulas Postor other ongoing cares.  GaSullivan LoneD MSKeeneematology/Oncology Physician CoAvenues [facility] Center(Office):       [phone] cell):  [phone]):           [phone]

## 2016-01-13 ENCOUNTER — Other Ambulatory Visit: Payer: Self-pay | Admitting: Internal Medicine

## 2016-01-14 ENCOUNTER — Other Ambulatory Visit: Payer: Self-pay | Admitting: *Deleted

## 2016-01-14 DIAGNOSIS — G894 Chronic pain syndrome: Secondary | ICD-10-CM

## 2016-01-14 MED ORDER — OXYCODONE-ACETAMINOPHEN 10-325 MG PO TABS: 1.0000 | ORAL_TABLET | Freq: Four times a day (QID) | ORAL | 0 refills | Status: DC | PRN

## 2016-01-14 NOTE — Telephone Encounter (Signed)
Patient requested and will pick up 

## 2016-01-17 ENCOUNTER — Ambulatory Visit: Payer: PPO | Admitting: Pharmacotherapy

## 2016-01-24 ENCOUNTER — Ambulatory Visit (INDEPENDENT_AMBULATORY_CARE_PROVIDER_SITE_OTHER): Payer: PPO | Admitting: Pharmacotherapy

## 2016-01-24 ENCOUNTER — Encounter: Payer: Self-pay | Admitting: Pharmacotherapy

## 2016-01-24 ENCOUNTER — Telehealth: Payer: Self-pay

## 2016-01-24 ENCOUNTER — Other Ambulatory Visit: Payer: Self-pay

## 2016-01-24 VITALS — BP 132/78 | HR 76 | Resp 14 | Ht 67.0 in | Wt 343.0 lb

## 2016-01-24 DIAGNOSIS — E1122 Type 2 diabetes mellitus with diabetic chronic kidney disease: Secondary | ICD-10-CM | POA: Diagnosis not present

## 2016-01-24 DIAGNOSIS — Z794 Long term (current) use of insulin: Secondary | ICD-10-CM | POA: Diagnosis not present

## 2016-01-24 DIAGNOSIS — E1165 Type 2 diabetes mellitus with hyperglycemia: Secondary | ICD-10-CM | POA: Diagnosis not present

## 2016-01-24 DIAGNOSIS — Z23 Encounter for immunization: Secondary | ICD-10-CM

## 2016-01-24 DIAGNOSIS — Z7901 Long term (current) use of anticoagulants: Secondary | ICD-10-CM

## 2016-01-24 DIAGNOSIS — I2782 Chronic pulmonary embolism: Secondary | ICD-10-CM | POA: Diagnosis not present

## 2016-01-24 LAB — POCT INR: INR: 2.4

## 2016-01-24 MED ORDER — WARFARIN SODIUM 5 MG PO TABS: ORAL_TABLET | ORAL | 5 refills | Status: DC

## 2016-01-24 MED ORDER — TOUJEO SOLOSTAR 300 UNIT/ML ~~LOC~~ SOPN: 70.0000 [IU] | PEN_INJECTOR | Freq: Once | SUBCUTANEOUS | 6 refills | Status: DC

## 2016-01-24 NOTE — Telephone Encounter (Signed)
Message left on triage voicemail. Rx for Toujeo was sent for # 3 =1 box, with patient's current dose 1 box with 3 pens will last 12 days.   New RX submitted with #9

## 2016-01-24 NOTE — Telephone Encounter (Signed)
No Rash, itching all over (primarilary on legs and arms)

## 2016-01-24 NOTE — Telephone Encounter (Signed)
Where is she itching? Does she have a rash?

## 2016-01-24 NOTE — Telephone Encounter (Signed)
Discussed with patient, patient verbalized understanding of response

## 2016-01-24 NOTE — Progress Notes (Signed)
   Subjective:    Patient ID: [name], female    DOB: [birth year], 81 y.o.   MRN: [id number]  HPI Last INR on 12/06/15 was OK at 3.0 Current Coumadin dose is 7.5mg  QD except 5mg  T/Th Denies CP, falls Denies unusual bleeding or bruising  Denies missed doses Did eat collard greens last.   Review of Systems  HENT: Negative for nosebleeds.   Respiratory: Negative for shortness of breath.   Cardiovascular: Negative for chest pain.  Gastrointestinal: Negative for anal bleeding and blood in stool.  Genitourinary: Negative for hematuria.  Hematological: Bruises/bleeds easily.       Objective:   Physical Exam  Constitutional: She is oriented to person, place, and time. She appears well-developed and well-nourished.  HENT:  Right Ear: External ear normal.  Left Ear: External ear normal.  Cardiovascular: Normal rate, regular rhythm and normal heart sounds.   Pulmonary/Chest: [place] normal and breath sounds normal.  Neurological: She is alert and oriented to person, place, and time.  Skin: Skin is warm and dry.  Psychiatric: She has a normal mood and affect. Her behavior is normal. Judgment and thought content normal.  Vitals reviewed.   BP: 132/78  HR: 76   Wt: 343lb INR 2.4      Assessment & Plan:  1.  INR at goal 2-3 2.  Continue Coumadin 7.5mg  QD except 5mg  T/Th 3.  INR in 1 month

## 2016-01-24 NOTE — Telephone Encounter (Signed)
Patient was in office today for a PT/INR check and requested a cream for itching. Patient recently used a cream that a family member has and it worked.   clobetasol 0.05 % topical ointment  Please advise if ok to fill/add to patient's medication list

## 2016-01-24 NOTE — Telephone Encounter (Signed)
Do not recommend steroid ointment unless she has a visible rash. Recommend she applies eucerin cream to skin BID

## 2016-01-24 NOTE — Patient Instructions (Signed)
INR 2.4 Continue Coumadin 7.5mg  daily except 5mg  Tuesdays and Thursdays

## 2016-01-26 ENCOUNTER — Ambulatory Visit: Payer: PPO | Admitting: Internal Medicine

## 2016-01-26 DIAGNOSIS — Z23 Encounter for immunization: Secondary | ICD-10-CM

## 2016-02-02 ENCOUNTER — Other Ambulatory Visit: Payer: Self-pay | Admitting: Internal Medicine

## 2016-02-02 ENCOUNTER — Ambulatory Visit (INDEPENDENT_AMBULATORY_CARE_PROVIDER_SITE_OTHER): Payer: PPO | Admitting: Internal Medicine

## 2016-02-02 ENCOUNTER — Encounter: Payer: Self-pay | Admitting: Internal Medicine

## 2016-02-02 VITALS — BP 132/80 | HR 76 | Ht 67.0 in | Wt 343.0 lb

## 2016-02-02 DIAGNOSIS — J45991 Cough variant asthma: Secondary | ICD-10-CM | POA: Diagnosis not present

## 2016-02-02 DIAGNOSIS — R0600 Dyspnea, unspecified: Secondary | ICD-10-CM | POA: Diagnosis not present

## 2016-02-02 LAB — PULMONARY FUNCTION TEST
DL/VA % pred: 80 %
DL/VA: 4.15 ml/min/mmHg/L
DLCO cor % pred: 66 %
DLCO cor: 18.68 ml/min/mmHg
DLCO unc % pred: 62 %
DLCO unc: 17.83 ml/min/mmHg
FEF 25-75 Post: 1.6 L/sec
FEF 25-75 Pre: 1.38 L/sec
FEF2575-%Change-Post: 15 %
FEF2575-%Pred-Post: 102 %
FEF2575-%Pred-Pre: 88 %
FEV1-%Change-Post: 4 %
FEV1-%Pred-Post: 76 %
FEV1-%Pred-Pre: 73 %
FEV1-Post: 1.43 L
FEV1-Pre: 1.36 L
FEV1FVC-%Change-Post: 0 %
FEV1FVC-%Pred-Pre: 108 %
FEV6-%Change-Post: 4 %
FEV6-%Pred-Post: 76 %
FEV6-%Pred-Pre: 72 %
FEV6-Post: 1.75 L
FEV6-Pre: 1.67 L
FEV6FVC-%Change-Post: 0 %
FEV6FVC-%Pred-Post: 104 %
FEV6FVC-%Pred-Pre: 103 %
FVC-%Change-Post: 4 %
FVC-%Pred-Post: 72 %
FVC-%Pred-Pre: 69 %
FVC-Post: 1.75 L
FVC-Pre: 1.68 L
Post FEV1/FVC ratio: 81 %
Post FEV6/FVC ratio: 100 %
Pre FEV1/FVC ratio: 81 %
Pre FEV6/FVC Ratio: 100 %

## 2016-02-02 LAB — NITRIC OXIDE: Nitric Oxide: 25

## 2016-02-02 NOTE — Progress Notes (Signed)
Here for pfts

## 2016-02-02 NOTE — Progress Notes (Signed)
Subjective:     Patient ID: [name], female   DOB: [birth year],   MRN: [id number]  Brief patient profile:  17 yowf never smoker obesity / could not tol cpap when last seen by [facility] 02/2014 and rx with symbicort since then and good control of presumably  asthma until  mid June 2017 cough/ breathing > UC rx prednisone some better then worse again.  llimited by knees to w/c x 2015    History of Present Illness  11/16/2015 1st [facility] office visit/ [name]   Chief Complaint  Patient presents with  . Pulmonary Consult    Self referral. Pt c/o SOB for the past 3 wks. She also c/o prod cough- clear today, but had coughed up "black strings" approx 1 wk ago. She states that she is SOB "all the time". She can not lie flat comfortably.    was able to lie flatter after prednisone x one week then gradually worse p finished prednisone assoc with hacking cough/ sub wheeze/ nasal and chest congestions  rec Prednisone 10 mg take  4 each am x 2 days,   2 each am x 2 days,  1 each am x 2 days and stop  Symbicort 160 Take 2 puffs first thing in am and then another 2 puffs about 12 hours later.  Only use your albuterol as a rescue medication Work on inhaler technique: Pantoprazole (protonix) 40 mg   Take  30-60 min before first meal of the day and Pepcid (famotidine)  20 mg one @  bedtime until return to office - this is the best way to tell whether stomach acid is contributing to your problem.      12/08/2015 NP follow Up : SOB and med calendar  Pt returns for 3 week follow up , seen last ov for pulmonary consult for dyspnea and cough  She was given prednisone taper for possible asthma flare .  She returns feeling better with less cough and dyspnea but not totally resolved . Still has a lot of sinus drainage and throat clearing. Nasal stuffiness.  FENO today is 32 We reviewed her meds and organized them into a med calendar . Appears to be taking meds correctly.  Daughter helps her with her meds.   rec Follow med calendar closely and bring to each visit.  Add Zyrtec 10mg  At bedtime   Add Singulair 10mg  At bedtime   follow up Dr. Melvyn Novas  In 6-8 weeks with PFT and As needed   Please contact office for sooner follow up if symptoms do not improve or worsen or seek emergency care    02/02/2016  f/u ov/[name] re: chronic cough/ no longer on symbiocrt ? Why/ has med calendar ? Daughter using  Chief Complaint  Patient presents with  . Follow-up    Cough has improved some, but still occ has am cough with green sputum. Her breathing is overall doing well. She is using albuterol inhaler 1 x per wk on average.    daugther not present/ confused with meds / does not recognize copy of med calendar   No obvious day to day or daytime variability or assoc  mucus plugs or hemoptysis or cp or chest tightness, subjective wheeze or overt sinus or hb symptoms. No unusual exp hx or h/o childhood pna/ asthma or knowledge of premature birth.  Sleeping ok without nocturnal  or early am exacerbation  of respiratory  c/o's or need for noct saba. Also denies any obvious fluctuation of symptoms  with weather or environmental changes or other aggravating or alleviating factors except as outlined above   Current Medications, Allergies, Complete Past Medical History, Past Surgical History, Family History, and Social History were reviewed in [employer] record.  ROS  The following are not active complaints unless bolded sore throat, dysphagia, dental problems, itching, sneezing,  nasal congestion or excess/ purulent secretions, ear ache,   fever, chills, sweats, unintended wt loss, classically pleuritic or exertional cp,  orthopnea pnd or leg swelling, presyncope, palpitations, abdominal pain, anorexia, nausea, vomiting, diarrhea  or change in bowel or bladder habits, change in stools or urine, dysuria,hematuria,  rash, arthralgias, visual complaints, headache, numbness, weakness or ataxia or problems  with walking or coordination,  change in mood/affect or memory.           Objective:   Physical Exam    amb obese w/c bound bf nad    Wt Readings from Last 3 Encounters:  02/02/16 (!) 343 lb (155.6 kg)  01/24/16 (!) 343 lb (155.6 kg)  12/13/15 (!) 341 lb 12.8 oz (155 kg)    Vital signs reviewed    HEENT: nl dentition, turbinates, and oropharynx. Nl external ear canals without cough reflex   NECK :  without JVD/Nodes/TM/ nl carotid upstrokes bilaterally   LUNGS: no acc muscle use,  Nl contour chest which is clear to A and P bilaterally   CV:  RRR  no s3 or murmur or increase in P2, no edema   ABD:  soft and nontender with nl inspiratory excursion in the supine position. No bruits or organomegaly, bowel sounds nl  MS:  Nl gait/ ext warm without deformities, calf tenderness, cyanosis or clubbing No obvious joint restrictions   SKIN: warm and dry without lesions    NEURO:  alert, approp, nl sensorium with  no motor deficits

## 2016-02-03 NOTE — Patient Instructions (Addendum)
See calendar for specific medication instructions and bring it back for each and every office visit for every healthcare provider you see.  Without it,  you may not receive the best quality medical care that we feel you deserve.  You will note that the calendar groups together  your maintenance  medications that are timed at particular times of the day.  Think of this as your checklist for what your doctor has instructed you to do until your next evaluation to see what benefit  there is  to staying on a consistent group of medications intended to keep you well.  The other group at the bottom is entirely up to you to use as you see fit  for specific symptoms that may arise between visits that require you to treat them on an as needed basis.  Think of this as your action plan or "what if" list.   Separating the top medications from the bottom group is fundamental to providing you adequate care going forward.    Return in 4 weeks with daughter and all active meds/ pill boxes

## 2016-02-03 NOTE — Assessment & Plan Note (Signed)
Body mass index is 53.72 no change   Lab Results  Component Value Date   TSH 0.733 06/25/2014     Contributing to gerd tendency/ doe/reviewed the need and the process to achieve and maintain neg calorie balance > defer f/u primary care including intermittently monitoring thyroid status

## 2016-02-03 NOTE — Assessment & Plan Note (Addendum)
11/16/2015  After extensive coaching HFA effectiveness =    50% > continue symbicort 160 2bid > stopped on her own - FENO 32 12/08/15 while on symbicort  - Allergy profile 12/08/2015 >  Eos 0.3 /  IgE  63 Pos only for dog allergy > singulair added  - PFT's  02/02/2016  Nl except dlco 62/66c and corrected for alv vol 80  - FENO 02/02/2016  =   25 off symbicort for weeks   With such a low feno and no flare off ICS it's possible the singulair is working so no need to change rx at this point but concerned about concept of med rec esp as pt does not recognize the med calendar we gave her and doesn't follow it (stopped symb which was listed as a maint rx)  For now will use the KIS principle > no change rx but need the daughter to return when she can with the pt to maintain med reconciliation/ keep up with med calendar  I had an extended discussion with the patient reviewing all relevant studies completed to date and  lasting 15 to 20 minutes of a 25 minute visit    Each maintenance medication was reviewed in detail including most importantly the difference between maintenance and prns and under what circumstances the prns are to be triggered using an action plan format that is not reflected in the computer generated alphabetically organized AVS but trather by a customized med calendar that reflects the AVS meds with confirmed 100% correlation.   Please see instructions for details which were reviewed in writing and the patient given a copy highlighting the part that I personally wrote and discussed at today's ov.

## 2016-02-14 ENCOUNTER — Other Ambulatory Visit: Payer: Self-pay | Admitting: *Deleted

## 2016-02-14 DIAGNOSIS — G894 Chronic pain syndrome: Secondary | ICD-10-CM

## 2016-02-14 MED ORDER — OXYCODONE-ACETAMINOPHEN 10-325 MG PO TABS: 1.0000 | ORAL_TABLET | Freq: Four times a day (QID) | ORAL | 0 refills | Status: DC | PRN

## 2016-02-14 NOTE — Telephone Encounter (Signed)
Patient requested and will pick up 

## 2016-02-21 ENCOUNTER — Encounter: Payer: Self-pay | Admitting: Pharmacotherapy

## 2016-02-21 ENCOUNTER — Ambulatory Visit (INDEPENDENT_AMBULATORY_CARE_PROVIDER_SITE_OTHER): Payer: PPO | Admitting: Pharmacotherapy

## 2016-02-21 VITALS — BP 142/80 | HR 85 | Temp 97.6°F | Resp 20 | Ht 67.0 in | Wt 349.0 lb

## 2016-02-21 DIAGNOSIS — Z7901 Long term (current) use of anticoagulants: Secondary | ICD-10-CM

## 2016-02-21 DIAGNOSIS — Z794 Long term (current) use of insulin: Secondary | ICD-10-CM | POA: Diagnosis not present

## 2016-02-21 DIAGNOSIS — E1165 Type 2 diabetes mellitus with hyperglycemia: Secondary | ICD-10-CM

## 2016-02-21 DIAGNOSIS — E1122 Type 2 diabetes mellitus with diabetic chronic kidney disease: Secondary | ICD-10-CM

## 2016-02-21 DIAGNOSIS — I2782 Chronic pulmonary embolism: Secondary | ICD-10-CM | POA: Diagnosis not present

## 2016-02-21 LAB — POCT INR: INR: 2.1

## 2016-02-21 MED ORDER — INSULIN ASPART 100 UNIT/ML ~~LOC~~ SOLN: 30.0000 [IU] | Freq: Three times a day (TID) | SUBCUTANEOUS | 6 refills | Status: DC

## 2016-02-21 NOTE — Patient Instructions (Signed)
INR 2.1 Continue Coumadin 7.5mg  (1 & 1/2 tablets) daily except 5mg  (1 tablet) on Tuesdays and Thursdays

## 2016-02-21 NOTE — Progress Notes (Signed)
   Subjective:    Patient ID: [name], female    DOB: 1934/11/22, 81 y.o.   MRN: [id number]  HPI Last INR on 01/24/16 was OK at 2.4 Current Coumadin dose is 7.5mg  QD except 5mg  T/th Denies CP, SOB, falls Denies missed doses Denies unusual bleeding or bruising. Consistent with vitamin K intake.  Review of Systems  HENT: Negative for nosebleeds.   Respiratory: Negative for shortness of breath.   Cardiovascular: Negative for chest pain.  Gastrointestinal: Negative for anal bleeding and blood in stool.  Genitourinary: Negative for hematuria.  Hematological: Does not bruise/bleed easily.       Objective:   Physical Exam  Constitutional: She is oriented to person, place, and time. She appears well-developed and well-nourished.  HENT:  Right Ear: External ear normal.  Left Ear: External ear normal.  Cardiovascular: Normal rate, regular rhythm and normal heart sounds.   Pulmonary/Chest: Effort normal and breath sounds normal.  Neurological: She is alert and oriented to person, place, and time.  Skin: Skin is warm and dry.  Psychiatric: She has a normal mood and affect. Her behavior is normal. Judgment and thought content normal.  Vitals reviewed.  INR 2.1 BP: 142/80  HR: 85  Wt: 349lb     Assessment & Plan:  1.  INR at goal 2-3 2.  Continue Coumadin 7.5mg  QD except 5mg  T/Th 3.  RTC 1 month

## 2016-02-29 ENCOUNTER — Other Ambulatory Visit: Payer: Self-pay | Admitting: Internal Medicine

## 2016-02-29 ENCOUNTER — Other Ambulatory Visit: Payer: Self-pay | Admitting: Nurse Practitioner

## 2016-03-03 ENCOUNTER — Encounter: Payer: PPO | Admitting: Adult Health

## 2016-03-08 ENCOUNTER — Ambulatory Visit: Payer: PPO | Admitting: Internal Medicine

## 2016-03-15 ENCOUNTER — Encounter (HOSPITAL_COMMUNITY): Payer: Self-pay | Admitting: *Deleted

## 2016-03-15 ENCOUNTER — Ambulatory Visit (HOSPITAL_COMMUNITY)
Admission: EM | Admit: 2016-03-15 | Discharge: 2016-03-15 | Disposition: A | Discharge status: Home / Self Care | Payer: PPO | Attending: Family Medicine | Admitting: Family Medicine

## 2016-03-15 DIAGNOSIS — E1100 Type 2 diabetes mellitus with hyperosmolarity without nonketotic hyperglycemic-hyperosmolar coma (NKHHC): Secondary | ICD-10-CM

## 2016-03-15 DIAGNOSIS — J410 Simple chronic bronchitis: Secondary | ICD-10-CM

## 2016-03-15 LAB — POCT URINALYSIS DIP (DEVICE)
Bilirubin Urine: NEGATIVE
Glucose, UA: 500 mg/dL — AB
Hgb urine dipstick: NEGATIVE
Ketones, ur: NEGATIVE mg/dL
Leukocytes, UA: NEGATIVE
Nitrite: NEGATIVE
Protein, ur: 100 mg/dL — AB
Specific Gravity, Urine: 1.02 (ref 1.005–1.030)
Urobilinogen, UA: 0.2 mg/dL (ref 0.0–1.0)
pH: 6 (ref 5.0–8.0)

## 2016-03-15 LAB — POCT I-STAT, CHEM 8
BUN: 26 mg/dL — ABNORMAL HIGH (ref 6–20)
Calcium, Ion: 1.14 mmol/L — ABNORMAL LOW (ref 1.15–1.40)
Chloride: 97 mmol/L — ABNORMAL LOW (ref 101–111)
Creatinine, Ser: 1.1 mg/dL — ABNORMAL HIGH (ref 0.44–1.00)
Glucose, Bld: 435 mg/dL — ABNORMAL HIGH (ref 65–99)
HCT: 32 % — ABNORMAL LOW (ref 36.0–46.0)
Hemoglobin: 10.9 g/dL — ABNORMAL LOW (ref 12.0–15.0)
Potassium: 5.1 mmol/L (ref 3.5–5.1)
Sodium: 135 mmol/L (ref 135–145)
TCO2: 27 mmol/L (ref 0–100)

## 2016-03-15 MED ORDER — ALBUTEROL SULFATE HFA 108 (90 BASE) MCG/ACT IN AERS: 1.0000 | INHALATION_SPRAY | Freq: Four times a day (QID) | RESPIRATORY_TRACT | 6 refills | Status: DC | PRN

## 2016-03-15 NOTE — ED Triage Notes (Signed)
Generalised  Body      Aches        Aching  All  Over     X   2  Days  Fingers  And  Joints  Aching   Both  Hips      Having       Burning   Sensation         denys  Any  Injury

## 2016-03-15 NOTE — ED Provider Notes (Signed)
[place]    CSN: [id number] Arrival date & time: 03/15/16  1624     History   Chief Complaint Chief Complaint  Patient presents with  . Generalized Body Aches    HPI [name] is a 81 y.o. female.   This is a 81 year old woman who comes in saying that all of her joints ache. She also says her ears ache, she's had some constipation and bloating, some polyuria, and the list goes on. He's not sure if she's had a fever. She's had no vomiting.  Her symptoms began about 3 days ago had discontinued round-the-clock.      Past Medical History:  Diagnosis Date  . Allergy    takes Mucinex daily as needed  . Anemia, unspecified   . Anxiety    takes Clonazepam daily as needed  . Arthritis   . CHF (congestive heart failure) (HCC)    takes Furosemide daily  . Coronary artery disease    a. s/p IMI 2004 tx with BMS to RCA;  b. s/p Promus DES to RCA 2/12 (cath: LM 20-30%, pLAD 20-30%, mLAD 50%, RI 30%, pRCA 60%, mRCA 90% - tx with PCI);  c. myoview 1/07: EF 49%, inf scar, no isch  . Coronary atherosclerosis of native coronary artery   . Depression    takes Cymbalta daily  . Diabetes mellitus    insulin daily  . Dysuria   . GERD (gastroesophageal reflux disease)    takes Protonix daily  . Gout, unspecified    takes Colchicine daily  . Headache    occasionally  . History of blood clots about 73yrs ago   in legs-takes Coumadin   . Hyperlipidemia    takes Pravastatin daily  . Hypertension    takes Imdur and Metoprolol daily  . Insomnia   . Joint pain   . Joint swelling   . Myocardial infarction 2004  . Numbness   . Obstructive sleep apnea   . Osteoarthritis   . Osteoarthritis   . Osteoarthrosis, unspecified whether generalized or localized, lower leg   . Pain, chronic   . Polymyalgia rheumatica ([place])   . Pulmonary emboli ([place]) 9/13   felt to need lifelong anticoagulation  . Shortness of breath dyspnea    with exertion and has Albuterol inhaler prn    . Type II or unspecified type diabetes mellitus without mention of complication, uncontrolled   . Urinary incontinence    takes Linzess daily  . Vertigo    hx of;was taking Meclizine if needed    Patient Active Problem List   Diagnosis Date Noted  . Cough variant asthma 11/17/2015  . Vitamin D deficiency 08/02/2015  . Breast cancer ([place]) 04/26/2015  . Dyslipidemia associated with type 2 diabetes mellitus ([place]) 02/06/2015  . Uncontrolled type 2 diabetes mellitus with diabetic nephropathy, with long-term current use of insulin ([place]) 02/06/2015  . Chronic pain syndrome 02/06/2015  . Morbid obesity with BMI of 50.0-59.9, adult ([place]) 12/18/2014  . CKD (chronic kidney disease) stage 3, GFR 30-59 ml/min 12/18/2014  . Gout 10/27/2014  . Breast mass 10/27/2014  . Encounter for therapeutic drug monitoring 10/08/2014  . Vertigo 09/24/2014  . Chronic bronchitis ([place]) 09/23/2014  . Preoperative clearance 08/16/2014  . Abnormal stress test   . DOE (dyspnea on exertion) 06/24/2014  . CN (constipation) 05/10/2014  . History of pulmonary embolus (PE) 04/22/2014  . Sepsis secondary to UTI ([place]) 04/21/2014  . COPD (chronic obstructive pulmonary disease) ([place]) 04/21/2014  .  Estrogen deficiency 02/19/2014  . Breast cancer screening 02/19/2014  . Weight gain 02/19/2014  . Pain in the chest 12/31/2013  . Type 2 diabetes mellitus with diabetic foot deformity ([place]) 07/29/2013  . OSA (obstructive sleep apnea) 02/24/2013  . Need for prophylactic vaccination and inoculation against influenza 01/08/2013  . Insomnia 09/17/2012  . Debility 08/01/2012  . Osteoarthritis   . Dysphagia, unspecified(787.20) 07/31/2012  . GERD (gastroesophageal reflux disease) 07/31/2012  . Long term current use of anticoagulant therapy 02/02/2012  . Chronic diastolic congestive heart failure ([place]) 01/29/2012  . History of deep venous thrombosis 01/27/2012  . Abdominal pain 01/26/2012  . CHOLELITHIASIS 06/06/2010  .  Situational depression 06/04/2009  . ORTHOPNEA 03/09/2009  . Morbid (severe) obesity due to excess calories ([place]) 02/15/2009  . ANEMIA 02/15/2009  . POLYMYALGIA RHEUMATICA 01/18/2009  . TENSION HEADACHE 01/07/2008  . Headache 01/07/2008  . FUNGAL DERMATITIS 11/13/2007  . CAD (coronary artery disease) 08/29/2007  . URINARY INCONTINENCE 08/29/2007  . Hyperlipidemia 12/03/2006  . Essential hypertension 12/03/2006  . DIVERTICULITIS, HX OF 12/03/2006    Past Surgical History:  Procedure Laterality Date  . ABDOMINAL HYSTERECTOMY     partial  . APPENDECTOMY    . blood clots/legs and lungs  2013  . BREAST LUMPECTOMY WITH RADIOACTIVE SEED LOCALIZATION Left 11/05/2014   Procedure: LEFT BREAST LUMPECTOMY WITH RADIOACTIVE SEED LOCALIZATION;  Surgeon: [name], MD;  Location: [facility];  Service: General;  Laterality: Left;  . CARDIAC CATHETERIZATION    . COLONOSCOPY    . CORONARY ANGIOPLASTY  2  . EXCISION OF SKIN TAG Right 11/05/2014   Procedure: EXCISION OF RIGHT EYELID SKIN TAG;  Surgeon: [name], MD;  Location: [place] [facility];  Service: General;  Laterality: Right;  . EYE SURGERY Bilateral    cataract   . GASTRIC BYPASS  1977    reversed in 1979, [place] N/A 06/29/2014   Procedure: LEFT HEART CATHETERIZATION WITH CORONARY ANGIOGRAM;  Surgeon: Troy Sine, MD;  Location[facility] CATH LAB;  Service: Cardiovascular;  Laterality: N/A;  . MI with stent placement  2004    OB History    No data available       Home Medications    Prior to Admission medications   Medication Sig Start Date End Date Taking? Authorizing Provider  albuterol (PROVENTIL HFA;VENTOLIN HFA) 108 (90 Base) MCG/ACT inhaler Inhale 1-2 puffs into the lungs every 6 (six) hours as needed for wheezing or shortness of breath. 03/15/16   Robyn Haber, MD  anastrozole (ARIMIDEX) 1 MG tablet Take 1 tablet (1 mg total) by mouth daily. 07/30/15   [name], MD  budesonide-formoterol [facility]) 160-4.5 MCG/ACT inhaler Inhale 2 puffs into the lungs 2 (two) times daily as needed (shortness of breath). 09/24/15   Gildardo Cranker, DO  cetirizine (ZYRTEC) 10 MG tablet Take 10 mg by mouth at bedtime.    Historical Provider, MD  cholecalciferol (VITAMIN D) 1000 units tablet Take 1,000 Units by mouth daily.    Historical Provider, MD  clonazePAM (KLONOPIN) 1 MG tablet TAKE TWO TABLETS BY MOUTH AT BEDTIME AS NEEDED FOR SLEEP 02/29/16   Estill Dooms, MD  diphenhydrAMINE (BENADRYL) 25 MG tablet Take per bottle as needed for itching    Historical Provider, MD  docusate sodium (COLACE) 100 MG capsule TAKE PER BOTTLE AS NEEDED FOR CONSTIPATION    Historical Provider, MD  DULoxetine (CYMBALTA) 60 MG capsule TAKE ONE CAPSULE BY MOUTH ONCE  DAILY FOR ANXIETY 10/26/15   Tiffany L Reed, DO  insulin aspart (NOVOLOG) 100 UNIT/ML injection Inject 30 Units into the skin 3 (three) times daily with meals. 02/21/16   Tivis Ringer, RPH-CPP  isosorbide mononitrate (IMDUR) 60 MG 24 hr tablet TAKE ONE TABLET BY MOUTH ONCE DAILY 10/26/15   Tiffany L Reed, DO  Lancets [facility] ULTRASOFT) lancets Check blood sugar 2-3 times daily as directed 11/01/15   Historical Provider, MD  metoprolol succinate (TOPROL-XL) 25 MG 24 hr tablet TAKE ONE TABLET BY MOUTH ONCE DAILY FOR  BLOOD  PRESSURE 02/29/16   Gildardo Cranker, DO  montelukast (SINGULAIR) 10 MG tablet Take 1 tablet (10 mg total) by mouth at bedtime. 12/08/15   Tammy S Parrett, NP  Multiple Vitamins-Minerals (HAIR/SKIN/NAILS/BIOTIN PO) Take 1 tablet by mouth every evening.    Historical Provider, MD  nitroGLYCERIN (NITROSTAT) 0.4 MG SL tablet Take one tablet under the tongue every 5 minutes as needed for chest pain 09/24/13   [name], NP  [facility] VERIO test strip Use 2-3 times daily 11/03/15   Historical Provider, MD  oxyCODONE-acetaminophen (PERCOCET) 10-325 MG tablet Take 1 tablet by mouth every 6 (six) hours as needed for  pain. 02/14/16   [name], NP  pantoprazole (PROTONIX) 40 MG tablet TAKE ONE TABLET BY MOUTH ONCE DAILY 10/26/15   Tiffany L Reed, DO  pravastatin (PRAVACHOL) 20 MG tablet TAKE ONE TABLET BY MOUTH ONCE DAILY FOR CHOLESTEROL 01/13/16   Tiffany L Reed, DO  sertraline (ZOLOFT) 25 MG tablet Take 1 tablet (25 mg total) by mouth at bedtime. 06/02/15   Gildardo Cranker, DO  sodium chloride (OCEAN) 0.65 % SOLN nasal spray RINSE AS NEEDED FOR SINUS CONGESTION    Historical Provider, MD  Spacer/Aero-Holding Chambers (AEROCHAMBER PLUS FLO-VU LARGE) MISC 1 each by Other route once. 11/04/15   Lysbeth Penner, FNP  temazepam (RESTORIL) 30 MG capsule TAKE ONE CAPSULE BY MOUTH AT BEDTIME AS NEEDED FOR SLEEP 02/29/16   Estill Dooms, MD  TOUJEO SOLOSTAR 300 UNIT/ML SOPN Inject 70 Units into the skin once. 01/24/16 02/02/16  Gildardo Cranker, DO  ULORIC 40 MG tablet TAKE ONE TABLET BY MOUTH ONCE DAILY 08/30/15   Gildardo Cranker, DO  warfarin (COUMADIN) 5 MG tablet Take 1 & 1/2  tablets daily except 1 tablet on Tuesdays, and Thursdays 01/24/16   Tivis Ringer, RPH-CPP    Family History Family History  Problem Relation Age of Onset  . Breast cancer Mother 4  . Heart disease Mother   . Throat cancer Father   . Hypertension Father   . Arthritis Father   . Diabetes Father   . Arthritis Sister   . Obesity Sister   . Diabetes Sister   . Heart disease Cousin     Social History Social History  Substance Use Topics  . Smoking status: Never Smoker  . Smokeless tobacco: Never Used  . Alcohol use No     Allergies   Sulfonamide derivatives and Tramadol   Review of Systems Review of Systems  Constitutional: Positive for diaphoresis and fatigue.  HENT: Positive for ear pain.   Eyes: Negative.   Respiratory: Negative for cough.   Cardiovascular: Negative for chest pain.  Gastrointestinal: Positive for abdominal distention and constipation.  Genitourinary: Positive for frequency.  Musculoskeletal: Positive  for arthralgias.  Neurological: Positive for headaches.     Physical Exam Triage Vital Signs ED Triage Vitals  Enc Vitals Group     BP 03/15/16 1647 185/78  Pulse Rate 03/15/16 1647 78     Resp 03/15/16 1647 18     Temp 03/15/16 1647 98.6 F (37 C)     Temp src --      SpO2 03/15/16 1647 100 %     Weight --      Height --      Head Circumference --      Peak Flow --      Pain Score 03/15/16 1646 10     Pain Loc --      Pain Edu? --      Excl. in [place]? --    No data found.   Updated Vital Signs BP 185/78 (BP Location: Left Arm)   Pulse 78   Temp 98.6 F (37 C)   Resp 18   SpO2 100%      Physical Exam  Constitutional: She is oriented to person, place, and time. She appears well-developed and well-nourished.  HENT:  Head: Normocephalic.  Right Ear: External ear normal.  Left Ear: External ear normal.  Mouth/Throat: Oropharynx is clear and moist.  Bilateral normal TMs  Eyes: Conjunctivae and EOM are normal. Pupils are equal, round, and reactive to light.  Neck: Normal range of motion. Neck supple. No thyromegaly present.  Cardiovascular: Normal rate, regular rhythm and normal heart sounds.   Pulmonary/Chest: Effort normal and breath sounds normal.  Abdominal: Soft. Bowel sounds are normal. She exhibits no distension. There is no tenderness. There is no rebound and no guarding.  Musculoskeletal: Normal range of motion.  Lymphadenopathy:    She has no cervical adenopathy.  Neurological: She is alert and oriented to person, place, and time.  Skin: Skin is warm and dry.  Nursing note and vitals reviewed.    UC Treatments / Results  Labs (all labs ordered are listed, but only abnormal results are displayed) Labs Reviewed  POCT I-STAT, CHEM 8 - Abnormal; Notable for the following:       Result Value   Chloride 97 (*)    BUN 26 (*)    Creatinine, Ser 1.10 (*)    Glucose, Bld 435 (*)    Calcium, Ion 1.14 (*)    Hemoglobin 10.9 (*)    HCT 32.0 (*)    All  other components within normal limits  POCT URINALYSIS DIP (DEVICE) - Abnormal; Notable for the following:    Glucose, UA 500 (*)    Protein, ur 100 (*)    All other components within normal limits    EKG  EKG Interpretation None       Radiology No results found.  Procedures Procedures (including critical care time)  Medications Ordered in UC Medications - No data to display   Initial Impression / Assessment and Plan / UC Course  I have reviewed the triage vital signs and the nursing notes.  Pertinent labs & imaging results that were available during my care of the patient were reviewed by me and considered in my medical decision making (see chart for details).  Clinical Course     Final Clinical Impressions(s) / UC Diagnoses   Final diagnoses:  Uncontrolled type 2 diabetes mellitus with hyperosmolarity without coma, unspecified long term insulin use status (HCC)    New Prescriptions Current Discharge Medication List    Resume Humalog 30 units twice a day, 3 times a day if 3 meals or consume. Continue the Toujeo. Check blood sugar twice a day until it is below 200. Follow-up with your primary care doctor in the  next couple days   Robyn Haber, MD 03/15/16 1751

## 2016-03-15 NOTE — Discharge Instructions (Signed)
Take the Humalog 30 units twice a day with each meal depending on what he had to have 3 meals. Continue the Toujeo at your current dose. You should check your sugar at least twice a day until he gets under 200.

## 2016-03-16 ENCOUNTER — Other Ambulatory Visit: Payer: Self-pay | Admitting: *Deleted

## 2016-03-16 ENCOUNTER — Telehealth: Payer: Self-pay

## 2016-03-16 DIAGNOSIS — G894 Chronic pain syndrome: Secondary | ICD-10-CM

## 2016-03-16 MED ORDER — OXYCODONE-ACETAMINOPHEN 10-325 MG PO TABS: 1.0000 | ORAL_TABLET | Freq: Four times a day (QID) | ORAL | 0 refills | Status: DC | PRN

## 2016-03-16 NOTE — Telephone Encounter (Signed)
I spoke with patient's daughter, [name], to let her know that her mother has a prescription for oxycodone-APAP 10-325 mg ready to pick up at the office. I tried to call patient directly several times but the line was busy.   Prescription was placed in filing cabinet at front desk.

## 2016-03-16 NOTE — Telephone Encounter (Signed)
Patient requested and will pick up 

## 2016-03-20 ENCOUNTER — Emergency Department (HOSPITAL_COMMUNITY): Payer: PPO

## 2016-03-20 ENCOUNTER — Emergency Department (HOSPITAL_COMMUNITY)
Admission: EM | Admit: 2016-03-20 | Discharge: 2016-03-20 | Disposition: A | Discharge status: Home / Self Care | Payer: PPO | Attending: Emergency Medicine | Admitting: Emergency Medicine

## 2016-03-20 ENCOUNTER — Encounter (HOSPITAL_COMMUNITY): Payer: Self-pay | Admitting: *Deleted

## 2016-03-20 DIAGNOSIS — S79911A Unspecified injury of right hip, initial encounter: Secondary | ICD-10-CM | POA: Diagnosis not present

## 2016-03-20 DIAGNOSIS — N183 Chronic kidney disease, stage 3 (moderate): Secondary | ICD-10-CM | POA: Insufficient documentation

## 2016-03-20 DIAGNOSIS — Y999 Unspecified external cause status: Secondary | ICD-10-CM | POA: Diagnosis not present

## 2016-03-20 DIAGNOSIS — R52 Pain, unspecified: Secondary | ICD-10-CM | POA: Diagnosis not present

## 2016-03-20 DIAGNOSIS — I130 Hypertensive heart and chronic kidney disease with heart failure and stage 1 through stage 4 chronic kidney disease, or unspecified chronic kidney disease: Secondary | ICD-10-CM | POA: Diagnosis not present

## 2016-03-20 DIAGNOSIS — Z7901 Long term (current) use of anticoagulants: Secondary | ICD-10-CM | POA: Diagnosis not present

## 2016-03-20 DIAGNOSIS — I252 Old myocardial infarction: Secondary | ICD-10-CM | POA: Diagnosis not present

## 2016-03-20 DIAGNOSIS — E1122 Type 2 diabetes mellitus with diabetic chronic kidney disease: Secondary | ICD-10-CM | POA: Insufficient documentation

## 2016-03-20 DIAGNOSIS — Y929 Unspecified place or not applicable: Secondary | ICD-10-CM | POA: Insufficient documentation

## 2016-03-20 DIAGNOSIS — J449 Chronic obstructive pulmonary disease, unspecified: Secondary | ICD-10-CM | POA: Insufficient documentation

## 2016-03-20 DIAGNOSIS — W06XXXA Fall from bed, initial encounter: Secondary | ICD-10-CM | POA: Insufficient documentation

## 2016-03-20 DIAGNOSIS — I2510 Atherosclerotic heart disease of native coronary artery without angina pectoris: Secondary | ICD-10-CM | POA: Insufficient documentation

## 2016-03-20 DIAGNOSIS — W19XXXA Unspecified fall, initial encounter: Secondary | ICD-10-CM

## 2016-03-20 DIAGNOSIS — M79604 Pain in right leg: Secondary | ICD-10-CM | POA: Diagnosis not present

## 2016-03-20 DIAGNOSIS — M25551 Pain in right hip: Secondary | ICD-10-CM | POA: Diagnosis not present

## 2016-03-20 DIAGNOSIS — Z853 Personal history of malignant neoplasm of breast: Secondary | ICD-10-CM | POA: Diagnosis not present

## 2016-03-20 DIAGNOSIS — Y939 Activity, unspecified: Secondary | ICD-10-CM | POA: Insufficient documentation

## 2016-03-20 DIAGNOSIS — I5032 Chronic diastolic (congestive) heart failure: Secondary | ICD-10-CM | POA: Diagnosis not present

## 2016-03-20 DIAGNOSIS — S8991XA Unspecified injury of right lower leg, initial encounter: Secondary | ICD-10-CM | POA: Diagnosis not present

## 2016-03-20 DIAGNOSIS — R51 Headache: Secondary | ICD-10-CM | POA: Diagnosis not present

## 2016-03-20 DIAGNOSIS — Z794 Long term (current) use of insulin: Secondary | ICD-10-CM | POA: Insufficient documentation

## 2016-03-20 DIAGNOSIS — S0990XA Unspecified injury of head, initial encounter: Secondary | ICD-10-CM | POA: Diagnosis not present

## 2016-03-20 DIAGNOSIS — S0083XA Contusion of other part of head, initial encounter: Secondary | ICD-10-CM | POA: Diagnosis not present

## 2016-03-20 LAB — CBC WITH DIFFERENTIAL/PLATELET
Basophils Absolute: 0 10*3/uL (ref 0.0–0.1)
Basophils Relative: 0 %
Eosinophils Absolute: 0.3 10*3/uL (ref 0.0–0.7)
Eosinophils Relative: 3 %
HCT: 33.1 % — ABNORMAL LOW (ref 36.0–46.0)
Hemoglobin: 10 g/dL — ABNORMAL LOW (ref 12.0–15.0)
Lymphocytes Relative: 25 %
Lymphs Abs: 2.5 10*3/uL (ref 0.7–4.0)
MCH: 26.5 pg (ref 26.0–34.0)
MCHC: 30.2 g/dL (ref 30.0–36.0)
MCV: 87.6 fL (ref 78.0–100.0)
Monocytes Absolute: 0.4 10*3/uL (ref 0.1–1.0)
Monocytes Relative: 4 %
Neutro Abs: 6.8 10*3/uL (ref 1.7–7.7)
Neutrophils Relative %: 68 %
Platelets: 344 10*3/uL (ref 150–400)
RBC: 3.78 MIL/uL — ABNORMAL LOW (ref 3.87–5.11)
RDW: 14.6 % (ref 11.5–15.5)
WBC: 10 10*3/uL (ref 4.0–10.5)

## 2016-03-20 LAB — BASIC METABOLIC PANEL
Anion gap: 8 (ref 5–15)
BUN: 25 mg/dL — ABNORMAL HIGH (ref 6–20)
CO2: 28 mmol/L (ref 22–32)
Calcium: 8.8 mg/dL — ABNORMAL LOW (ref 8.9–10.3)
Chloride: 102 mmol/L (ref 101–111)
Creatinine, Ser: 1.44 mg/dL — ABNORMAL HIGH (ref 0.44–1.00)
GFR calc Af Amer: 39 mL/min — ABNORMAL LOW (ref >60)
GFR calc non Af Amer: 34 mL/min — ABNORMAL LOW (ref >60)
Glucose, Bld: 212 mg/dL — ABNORMAL HIGH (ref 65–99)
Potassium: 4.2 mmol/L (ref 3.5–5.1)
Sodium: 138 mmol/L (ref 135–145)

## 2016-03-20 LAB — PROTIME-INR
INR: 1.7
Prothrombin Time: 20.2 seconds — ABNORMAL HIGH (ref 11.4–15.2)

## 2016-03-20 NOTE — ED Notes (Signed)
Pt is in stable condition upon d/c and is escorted from ED via wheelchair. 

## 2016-03-20 NOTE — ED Triage Notes (Signed)
Pt states she fell off her bed last night around midnight and hit her head and right hip. Pt states her head hit the night stand and she fell onto her right side. Pt has a laceration over the right eye. Pt denies loc, but endorses dizziness. Pt on coumadin.

## 2016-03-20 NOTE — ED Notes (Signed)
Called case management.

## 2016-03-20 NOTE — ED Provider Notes (Signed)
[facility] DEPT Provider Note   CSN: [id number] Arrival date & time: 03/20/16  1108     History   Chief Complaint Chief Complaint  Patient presents with  . Fall    HPI Glorie DEAJAH ERKKILA is a 81 y.o. female.  Pt said that she fell out of bed last night around midnight.  The pt said that she has a very hard time getting out of bed and hit her head and right hip.  Pt is not ambulatory normally.  She said that she did not have a loc.  Pt is on coumadin.  She feels dizzy when she gets up, but that is normal for pt.      Past Medical History:  Diagnosis Date  . Allergy    takes Mucinex daily as needed  . Anemia, unspecified   . Anxiety    takes Clonazepam daily as needed  . Arthritis   . CHF (congestive heart failure) (HCC)    takes Furosemide daily  . Coronary artery disease    a. s/p IMI 2004 tx with BMS to RCA;  b. s/p Promus DES to RCA 2/12 (cath: LM 20-30%, pLAD 20-30%, mLAD 50%, RI 30%, pRCA 60%, mRCA 90% - tx with PCI);  c. myoview 1/07: EF 49%, inf scar, no isch  . Coronary atherosclerosis of native coronary artery   . Depression    takes Cymbalta daily  . Diabetes mellitus    insulin daily  . Dysuria   . GERD (gastroesophageal reflux disease)    takes Protonix daily  . Gout, unspecified    takes Colchicine daily  . Headache    occasionally  . History of blood clots about 33yrs ago   in legs-takes Coumadin   . Hyperlipidemia    takes Pravastatin daily  . Hypertension    takes Imdur and Metoprolol daily  . Insomnia   . Joint pain   . Joint swelling   . Myocardial infarction 2004  . Numbness   . Obstructive sleep apnea   . Osteoarthritis   . Osteoarthritis   . Osteoarthrosis, unspecified whether generalized or localized, lower leg   . Pain, chronic   . Polymyalgia rheumatica ([place])   . Pulmonary emboli ([place]) 9/13   felt to need lifelong anticoagulation  . Shortness of breath dyspnea    with exertion and has Albuterol inhaler prn  . Type II or  unspecified type diabetes mellitus without mention of complication, uncontrolled   . Urinary incontinence    takes Linzess daily  . Vertigo    hx of;was taking Meclizine if needed    Patient Active Problem List   Diagnosis Date Noted  . Cough variant asthma 11/17/2015  . Vitamin D deficiency 08/02/2015  . Breast cancer ([place]) 04/26/2015  . Dyslipidemia associated with type 2 diabetes mellitus ([place]) 02/06/2015  . Uncontrolled type 2 diabetes mellitus with diabetic nephropathy, with long-term current use of insulin ([place]) 02/06/2015  . Chronic pain syndrome 02/06/2015  . Morbid obesity with BMI of 50.0-59.9, adult ([place]) 12/18/2014  . CKD (chronic kidney disease) stage 3, GFR 30-59 ml/min 12/18/2014  . Gout 10/27/2014  . Breast mass 10/27/2014  . Encounter for therapeutic drug monitoring 10/08/2014  . Vertigo 09/24/2014  . Chronic bronchitis ([place]) 09/23/2014  . Preoperative clearance 08/16/2014  . Abnormal stress test   . DOE (dyspnea on exertion) 06/24/2014  . CN (constipation) 05/10/2014  . History of pulmonary embolus (PE) 04/22/2014  . Sepsis secondary to UTI ([place]) 04/21/2014  . COPD (  chronic obstructive pulmonary disease) ([place]) 04/21/2014  . Estrogen deficiency 02/19/2014  . Breast cancer screening 02/19/2014  . Weight gain 02/19/2014  . Pain in the chest 12/31/2013  . Type 2 diabetes mellitus with diabetic foot deformity ([place]) 07/29/2013  . OSA (obstructive sleep apnea) 02/24/2013  . Need for prophylactic vaccination and inoculation against influenza 01/08/2013  . Insomnia 09/17/2012  . Debility 08/01/2012  . Osteoarthritis   . Dysphagia, unspecified(787.20) 07/31/2012  . GERD (gastroesophageal reflux disease) 07/31/2012  . Long term current use of anticoagulant therapy 02/02/2012  . Chronic diastolic congestive heart failure ([place]) 01/29/2012  . History of deep venous thrombosis 01/27/2012  . Abdominal pain 01/26/2012  . CHOLELITHIASIS 06/06/2010  . Situational  depression 06/04/2009  . ORTHOPNEA 03/09/2009  . Morbid (severe) obesity due to excess calories ([place]) 02/15/2009  . ANEMIA 02/15/2009  . POLYMYALGIA RHEUMATICA 01/18/2009  . TENSION HEADACHE 01/07/2008  . Headache 01/07/2008  . FUNGAL DERMATITIS 11/13/2007  . CAD (coronary artery disease) 08/29/2007  . URINARY INCONTINENCE 08/29/2007  . Hyperlipidemia 12/03/2006  . Essential hypertension 12/03/2006  . DIVERTICULITIS, HX OF 12/03/2006    Past Surgical History:  Procedure Laterality Date  . ABDOMINAL HYSTERECTOMY     partial  . APPENDECTOMY    . blood clots/legs and lungs  2013  . BREAST LUMPECTOMY WITH RADIOACTIVE SEED LOCALIZATION Left 11/05/2014   Procedure: LEFT BREAST LUMPECTOMY WITH RADIOACTIVE SEED LOCALIZATION;  Surgeon: [name], MD;  Location: [facility];  Service: General;  Laterality: Left;  . CARDIAC CATHETERIZATION    . COLONOSCOPY    . CORONARY ANGIOPLASTY  2  . EXCISION OF SKIN TAG Right 11/05/2014   Procedure: EXCISION OF RIGHT EYELID SKIN TAG;  Surgeon: [name], MD;  Location: [facility];  Service: General;  Laterality: Right;  . EYE SURGERY Bilateral    cataract   . GASTRIC BYPASS  1977    reversed in 1979, [place] N/A 06/29/2014   Procedure: LEFT HEART CATHETERIZATION WITH CORONARY ANGIOGRAM;  Surgeon: Troy Sine, MD;  Location: [facility] CATH LAB;  Service: Cardiovascular;  Laterality: N/A;  . MI with stent placement  2004    OB History    No data available       Home Medications    Prior to Admission medications   Medication Sig Start Date End Date Taking? Authorizing Provider  albuterol (PROVENTIL HFA;VENTOLIN HFA) 108 (90 Base) MCG/ACT inhaler Inhale 1-2 puffs into the lungs every 6 (six) hours as needed for wheezing or shortness of breath. 03/15/16   Robyn Haber, MD  anastrozole (ARIMIDEX) 1 MG tablet Take 1 tablet (1 mg total) by mouth daily. 07/30/15   [name], MD    budesonide-formoterol [facility]) 160-4.5 MCG/ACT inhaler Inhale 2 puffs into the lungs 2 (two) times daily as needed (shortness of breath). 09/24/15   Gildardo Cranker, DO  cetirizine (ZYRTEC) 10 MG tablet Take 10 mg by mouth at bedtime.    Historical Provider, MD  cholecalciferol (VITAMIN D) 1000 units tablet Take 1,000 Units by mouth daily.    Historical Provider, MD  clonazePAM (KLONOPIN) 1 MG tablet TAKE TWO TABLETS BY MOUTH AT BEDTIME AS NEEDED FOR SLEEP 02/29/16   Estill Dooms, MD  diphenhydrAMINE (BENADRYL) 25 MG tablet Take per bottle as needed for itching    Historical Provider, MD  docusate sodium (COLACE) 100 MG capsule TAKE PER BOTTLE AS NEEDED FOR CONSTIPATION    Historical Provider, MD  DULoxetine (CYMBALTA)  60 MG capsule TAKE ONE CAPSULE BY MOUTH ONCE DAILY FOR ANXIETY 10/26/15   Tiffany L Reed, DO  insulin aspart (NOVOLOG) 100 UNIT/ML injection Inject 30 Units into the skin 3 (three) times daily with meals. 02/21/16   Tivis Ringer, RPH-CPP  isosorbide mononitrate (IMDUR) 60 MG 24 hr tablet TAKE ONE TABLET BY MOUTH ONCE DAILY 10/26/15   Tiffany L Reed, DO  Lancets [facility] ULTRASOFT) lancets Check blood sugar 2-3 times daily as directed 11/01/15   Historical Provider, MD  metoprolol succinate (TOPROL-XL) 25 MG 24 hr tablet TAKE ONE TABLET BY MOUTH ONCE DAILY FOR  BLOOD  PRESSURE 02/29/16   Gildardo Cranker, DO  montelukast (SINGULAIR) 10 MG tablet Take 1 tablet (10 mg total) by mouth at bedtime. 12/08/15   Tammy S Parrett, NP  Multiple Vitamins-Minerals (HAIR/SKIN/NAILS/BIOTIN PO) Take 1 tablet by mouth every evening.    Historical Provider, MD  nitroGLYCERIN (NITROSTAT) 0.4 MG SL tablet Take one tablet under the tongue every 5 minutes as needed for chest pain 09/24/13   [name], NP  [facility] VERIO test strip Use 2-3 times daily 11/03/15   Historical Provider, MD  oxyCODONE-acetaminophen (PERCOCET) 10-325 MG tablet Take 1 tablet by mouth every 6 (six) hours as needed for pain.  03/16/16   [name], NP  pantoprazole (PROTONIX) 40 MG tablet TAKE ONE TABLET BY MOUTH ONCE DAILY 10/26/15   Tiffany L Reed, DO  pravastatin (PRAVACHOL) 20 MG tablet TAKE ONE TABLET BY MOUTH ONCE DAILY FOR CHOLESTEROL 01/13/16   Tiffany L Reed, DO  sertraline (ZOLOFT) 25 MG tablet Take 1 tablet (25 mg total) by mouth at bedtime. 06/02/15   Gildardo Cranker, DO  sodium chloride (OCEAN) 0.65 % SOLN nasal spray RINSE AS NEEDED FOR SINUS CONGESTION    Historical Provider, MD  Spacer/Aero-Holding Chambers (AEROCHAMBER PLUS FLO-VU LARGE) MISC 1 each by Other route once. 11/04/15   Lysbeth Penner, FNP  temazepam (RESTORIL) 30 MG capsule TAKE ONE CAPSULE BY MOUTH AT BEDTIME AS NEEDED FOR SLEEP 02/29/16   Estill Dooms, MD  TOUJEO SOLOSTAR 300 UNIT/ML SOPN Inject 70 Units into the skin once. 01/24/16 02/02/16  Gildardo Cranker, DO  ULORIC 40 MG tablet TAKE ONE TABLET BY MOUTH ONCE DAILY 08/30/15   Gildardo Cranker, DO  warfarin (COUMADIN) 5 MG tablet Take 1 & 1/2  tablets daily except 1 tablet on Tuesdays, and Thursdays 01/24/16   Tivis Ringer, RPH-CPP    Family History Family History  Problem Relation Age of Onset  . Breast cancer Mother 79  . Heart disease Mother   . Throat cancer Father   . Hypertension Father   . Arthritis Father   . Diabetes Father   . Arthritis Sister   . Obesity Sister   . Diabetes Sister   . Heart disease Cousin     Social History Social History  Substance Use Topics  . Smoking status: Never Smoker  . Smokeless tobacco: Never Used  . Alcohol use No     Allergies   Sulfonamide derivatives and Tramadol   Review of Systems Review of Systems  Skin: Positive for wound.  Neurological: Positive for dizziness and headaches.  All other systems reviewed and are negative.    Physical Exam Updated Vital Signs BP 140/63 (BP Location: Left Arm)   Pulse 77   Temp 98.4 F (36.9 C) (Oral)   Resp 18   Ht 5\' 7"  (1.702 m)   Wt (!) 341 lb (154.7 kg)   SpO2 95%  BMI  53.41 kg/m   Physical Exam  Constitutional: She is oriented to person, place, and time. She appears well-developed and well-nourished.  HENT:  Head: Normocephalic.  2 small healing lacerations around right eye  Eyes: Conjunctivae and EOM are normal. Pupils are equal, round, and reactive to light.  Neck: Normal range of motion. Neck supple.  Cardiovascular: Normal rate, regular rhythm, normal heart sounds and intact distal pulses.   Pulmonary/Chest: Effort normal and breath sounds normal.  Abdominal: Soft. Bowel sounds are normal.  Musculoskeletal: Normal range of motion.  Neurological: She is alert and oriented to person, place, and time.  Skin: Skin is warm.  Psychiatric: She has a normal mood and affect. Her behavior is normal. Judgment and thought content normal.  Nursing note and vitals reviewed.    ED Treatments / Results  Labs (all labs ordered are listed, but only abnormal results are displayed) Labs Reviewed  PROTIME-INR - Abnormal; Notable for the following:       Result Value   Prothrombin Time 20.2 (*)    All other components within normal limits  CBC WITH DIFFERENTIAL/PLATELET - Abnormal; Notable for the following:    RBC 3.78 (*)    Hemoglobin 10.0 (*)    HCT 33.1 (*)    All other components within normal limits  BASIC METABOLIC PANEL - Abnormal; Notable for the following:    Glucose, Bld 212 (*)    BUN 25 (*)    Creatinine, Ser 1.44 (*)    Calcium 8.8 (*)    GFR calc non Af Amer 34 (*)    GFR calc Af Amer 39 (*)    All other components within normal limits    EKG  EKG Interpretation None       Radiology Ct Head Wo Contrast  Result Date: 03/20/2016 CLINICAL DATA:  [place] with a laceration over the right eye. EXAM: CT HEAD WITHOUT CONTRAST TECHNIQUE: Contiguous axial images were obtained from the base of the skull through the vertex without intravenous contrast. COMPARISON:  09/24/2014 FINDINGS: Brain: Diffuse low density throughout the periventricular  white matter is stable. No evidence for acute hemorrhage, mass lesion, midline shift, hydrocephalus or large infarct. Vascular: No hyperdense vessel or unexpected calcification. Skull: No fracture. Sinuses/Orbits: Sinuses are clear. Other: Scalp hematoma along the right forehead and right supraorbital region. IMPRESSION: No acute intracranial abnormality. Stable white matter changes are suggestive for chronic small vessel ischemic changes. Right frontal scalp hematoma. Electronically Signed   By: Markus Daft M.D.   On: 03/20/2016 13:09   Dg Knee Complete 4 Views Right  Result Date: 03/20/2016 CLINICAL DATA:  [place] out of bed last night.  Right leg pain. EXAM: RIGHT KNEE - COMPLETE 4+ VIEW COMPARISON:  None. FINDINGS: Advanced degenerative changes in the right knee, most pronounced in the medial and patellofemoral compartments with joint space loss and spurring. No acute bony abnormality. Specifically, no fracture, subluxation, or dislocation. Soft tissues are intact. No joint effusion. IMPRESSION: Advanced tricompartment degenerative changes. No acute bony abnormality. Electronically Signed   By: Rolm Baptise M.D.   On: 03/20/2016 12:50   Dg Hip Unilat With Pelvis 2-3 Views Right  Result Date: 03/20/2016 CLINICAL DATA:  Fall out of bed last night.  Right hip pain. EXAM: DG HIP (WITH OR WITHOUT PELVIS) 2-3V RIGHT COMPARISON:  None. FINDINGS: Moderate degenerative changes in the hips bilaterally with joint space narrowing and spurring. No acute bony abnormality. Specifically, no fracture, subluxation, or dislocation. Soft tissues are intact. IMPRESSION:  No acute bony abnormality. Electronically Signed   By: Rolm Baptise M.D.   On: 03/20/2016 12:50    Procedures Procedures (including critical care time)  Medications Ordered in ED Medications - No data to display   Initial Impression / Assessment and Plan / ED Course  I have reviewed the triage vital signs and the nursing notes.  Pertinent labs &  imaging results that were available during my care of the patient were reviewed by me and considered in my medical decision making (see chart for details).  Clinical Course    Lacerations are too old to suture.  The pt d/w case management regarding home hospital equipment.  Case manager will talk with her equipment people tomorrow regarding this issue.  Pt to return if worse.  Final Clinical Impressions(s) / ED Diagnoses   Final diagnoses:  Pain  Fall, initial encounter  Contusion of face, initial encounter    New Prescriptions New Prescriptions   No medications on file     Isla Pence, MD 03/20/16 1500

## 2016-03-23 ENCOUNTER — Other Ambulatory Visit: Payer: Self-pay | Admitting: Internal Medicine

## 2016-03-24 ENCOUNTER — Encounter: Payer: Self-pay | Admitting: Internal Medicine

## 2016-03-24 ENCOUNTER — Ambulatory Visit (INDEPENDENT_AMBULATORY_CARE_PROVIDER_SITE_OTHER): Payer: PPO | Admitting: Internal Medicine

## 2016-03-24 ENCOUNTER — Ambulatory Visit (INDEPENDENT_AMBULATORY_CARE_PROVIDER_SITE_OTHER): Payer: PPO

## 2016-03-24 VITALS — BP 120/80 | HR 72 | Temp 97.4°F | Ht 67.0 in

## 2016-03-24 DIAGNOSIS — S0083XA Contusion of other part of head, initial encounter: Secondary | ICD-10-CM | POA: Diagnosis not present

## 2016-03-24 DIAGNOSIS — Z961 Presence of intraocular lens: Secondary | ICD-10-CM | POA: Diagnosis not present

## 2016-03-24 DIAGNOSIS — C50912 Malignant neoplasm of unspecified site of left female breast: Secondary | ICD-10-CM

## 2016-03-24 DIAGNOSIS — Z794 Long term (current) use of insulin: Secondary | ICD-10-CM | POA: Diagnosis not present

## 2016-03-24 DIAGNOSIS — H353132 Nonexudative age-related macular degeneration, bilateral, intermediate dry stage: Secondary | ICD-10-CM | POA: Diagnosis not present

## 2016-03-24 DIAGNOSIS — E113413 Type 2 diabetes mellitus with severe nonproliferative diabetic retinopathy with macular edema, bilateral: Secondary | ICD-10-CM | POA: Diagnosis not present

## 2016-03-24 DIAGNOSIS — E1165 Type 2 diabetes mellitus with hyperglycemia: Secondary | ICD-10-CM | POA: Diagnosis not present

## 2016-03-24 DIAGNOSIS — H35371 Puckering of macula, right eye: Secondary | ICD-10-CM | POA: Diagnosis not present

## 2016-03-24 DIAGNOSIS — I10 Essential (primary) hypertension: Secondary | ICD-10-CM

## 2016-03-24 DIAGNOSIS — Z0000 Encounter for general adult medical examination without abnormal findings: Secondary | ICD-10-CM

## 2016-03-24 DIAGNOSIS — I5032 Chronic diastolic (congestive) heart failure: Secondary | ICD-10-CM | POA: Diagnosis not present

## 2016-03-24 DIAGNOSIS — H5052 Exophoria: Secondary | ICD-10-CM | POA: Diagnosis not present

## 2016-03-24 DIAGNOSIS — Z7901 Long term (current) use of anticoagulants: Secondary | ICD-10-CM

## 2016-03-24 DIAGNOSIS — G894 Chronic pain syndrome: Secondary | ICD-10-CM | POA: Diagnosis not present

## 2016-03-24 DIAGNOSIS — F332 Major depressive disorder, recurrent severe without psychotic features: Secondary | ICD-10-CM | POA: Diagnosis not present

## 2016-03-24 DIAGNOSIS — E1122 Type 2 diabetes mellitus with diabetic chronic kidney disease: Secondary | ICD-10-CM

## 2016-03-24 DIAGNOSIS — I2782 Chronic pulmonary embolism: Secondary | ICD-10-CM

## 2016-03-24 DIAGNOSIS — H3581 Retinal edema: Secondary | ICD-10-CM | POA: Diagnosis not present

## 2016-03-24 DIAGNOSIS — H532 Diplopia: Secondary | ICD-10-CM | POA: Diagnosis not present

## 2016-03-24 LAB — HM DIABETES EYE EXAM

## 2016-03-24 NOTE — Progress Notes (Signed)
Patient ID: [name], female   DOB: [birth year], 81 y.o.   MRN: [id number]    Location:  [name] Place of Service: OFFICE  Chief Complaint  Patient presents with  . Medical Management of Chronic Issues    3 months routine visit    HPI:  81 yo female seen today for f/u. She fell OOB 11/13th and hit her right face/head and right hip. She went to the ED and had imaging done. Takes Coumadin for PE. INR in ED 1.7. Hgb 10; Cr 1.44. She saw eye specialist this AM and told no significant injury to OD. She did not receive laceration repair due to laceration being too old as pt waited  Several hrs prior to ED presentation. She was seen in Urgent care earlier this month for hyperglycemia. Insulin resumed  Gout - stable on uloric. No recent flares  CAD/hyperlipidemia/HTN - stable on imdur, metoprolol. Has not needed SL NTG. Takes lasix prn swelling  Chronic bronchitis - reports SOB and not able to move around as much. She uses HFA prn and symbicort.   PE - on coumadin tx. No bleeding. Last INR 1.7 and coumadin not adjusted. No bleeding  Chronic pain syndrome/PMR/OA - stable overall on percocet. She reports increased pain in her chest wall that radiates from left breast to right. She is taking cymbalta also  Insomnia/depression/anxiety - not sleeping well even with medication restoril 11m. She gets about 4 hrs per night which is an improvement. She still takes klonopin and sertraline 277mqhs. Takes cymbalta also  GERD - stable on PPI  DM - BS at home in 100s. No low BS reactions. She is making healthy food choices. She injects 70 units (instead of 60 due to cost of med) Toujeo BID. She has not completed Toujeo Rx assistance forms yet. She takes 30 units humalog BID at least. She does not eat 3 full meals per day but is getting 2 per day. She c/o intermittent pain with numbness/tingling in hands and feet. She is unable to grasp and hold objects in right hand.   Breast CA - stable; takes arimidex  daily. Followed by oncology  vaginal d/c and itching - improved after topical/intravaginal tx. brown d/c. Has foul smelling urine and urine is brown colored. No dysuria, hematuria, urinary frequency/urgency. 1 episode of difficulty starting urine stream but none since. No abdominal pain, N/V, f/c. She completed 7 days of cipro and diflucan tx which helped. She has not seen gyn in several mos and was supposed to have d&c for endometrial polyp but never followed through. She was seeing Dr RoHarrington ChallengerShe is a poor historian due to memory issues. Hx obtained from daughter.   Past Medical History:  Diagnosis Date  . Allergy    takes Mucinex daily as needed  . Anemia, unspecified   . Anxiety    takes Clonazepam daily as needed  . Arthritis   . CHF (congestive heart failure) (HCC)    takes Furosemide daily  . Coronary artery disease    a. s/p IMI 2004 tx with BMS to RCA;  b. s/p Promus DES to RCA 2/12 (cath: LM 20-30%, pLAD 20-30%, mLAD 50%, RI 30%, pRCA 60%, mRCA 90% - tx with PCI);  c. myoview 1/07: EF 49%, inf scar, no isch  . Coronary atherosclerosis of native coronary artery   . Depression    takes Cymbalta daily  . Diabetes mellitus    insulin daily  . Dysuria   . GERD (gastroesophageal  reflux disease)    takes Protonix daily  . Gout, unspecified    takes Colchicine daily  . Headache    occasionally  . History of blood clots about 51yr ago   in legs-takes Coumadin   . Hyperlipidemia    takes Pravastatin daily  . Hypertension    takes Imdur and Metoprolol daily  . Insomnia   . Joint pain   . Joint swelling   . Myocardial infarction 2004  . Numbness   . Obstructive sleep apnea   . Osteoarthritis   . Osteoarthritis   . Osteoarthrosis, unspecified whether generalized or localized, lower leg   . Pain, chronic   . [place]    . Pulmonary emboli ([place] 9/[place]   felt to need lifelong anticoagulation  . Shortness of breath dyspnea    with exertion and has Albuterol  inhaler prn  . Type II or unspecified type diabetes mellitus without mention of complication, uncontrolled   . Urinary incontinence    takes Linzess daily  . Vertigo    hx of;was taking Meclizine if needed    Past Surgical History:  Procedure Laterality Date  . ABDOMINAL HYSTERECTOMY     partial  . APPENDECTOMY    . blood clots/legs and lungs  2013  . BREAST LUMPECTOMY WITH RADIOACTIVE SEED LOCALIZATION Left 11/05/2014   Procedure: LEFT BREAST LUMPECTOMY WITH RADIOACTIVE SEED LOCALIZATION;  Surgeon: DCoralie Keens MD;  [place]  Service: General;  Laterality: Left;  . CARDIAC CATHETERIZATION    . COLONOSCOPY    . CORONARY ANGIOPLASTY  2  . EXCISION OF SKIN TAG Right 11/05/2014   Procedure: EXCISION OF RIGHT EYELID SKIN TAG;  Surgeon: DCoralie Keens MD;  [place]  Service: General;  Laterality: Right;  . EYE SURGERY Bilateral    cataract   . GASTRIC BYPASS  1977    [place], [place] 06/[place]/2014   Procedure: LEFT HEART CATHETERIZATION WITH CORONARY ANGIOGRAM;  Surgeon: TTroy Sine MD;  [facility];  Service: Cardiovascular;  Laterality: N/A;  . MI with stent placement  2004    Patient Care Team: MGildardo Cranker DO as PCP - General (Internal Medicine) TRenella Cunas MD (Inactive) as Consulting Physician (Cardiology) RClent Jacks MD as Consulting Physician (Ophthalmology) DCoralie Keens MD as Consulting Physician (General Surgery)  Social History   Social History  . Marital status: Widowed    Spouse name: N/A  . Number of children: N/A  . Years of education: N/A   Occupational History  . retired    Social History Main Topics  . Smoking status: Never Smoker  . Smokeless tobacco: Never Used  . Alcohol use No  . Drug use: No  . Sexual activity: Not Currently   Other Topics Concern  . Not on file   Social History Narrative  . No narrative on file     reports  that she has never smoked. She has never used smokeless tobacco. She reports that she does not drink alcohol or use drugs.  Family History  Problem Relation Age of Onset  . Breast cancer Mother 629 . Heart disease Mother   . Throat cancer Father   . Hypertension Father   . Arthritis Father   . Diabetes Father   . Arthritis Sister   . Obesity Sister   . Diabetes Sister   . Heart disease Cousin    Family Status  Relation Status  .  Mother Deceased at age 24   Cause of Death: Breast cancer  . Father Deceased at age 36   Cause of Death: Complications of diabetes & HTN  . Sister Deceased at age 21  . Sister Alive  . Daughter Alive  . Son Alive  . Sister Alive  . Sister Alive  . Brother Alive  . Daughter Alive  . Maternal Grandmother Deceased  . Maternal Grandfather Deceased  . Paternal Grandmother Deceased  . Paternal Grandfather Deceased  . Cousin Deceased     Allergies  Allergen Reactions  . Sulfonamide Derivatives Swelling    Mouth swelling  . Tramadol Nausea And Vomiting    Medications: Patient's Medications  New Prescriptions   No medications on file  Previous Medications   ALBUTEROL (PROVENTIL HFA;VENTOLIN HFA) 108 (90 BASE) MCG/ACT INHALER    Inhale 1-2 puffs into the lungs every 6 (six) hours as needed for wheezing or shortness of breath.   ANASTROZOLE (ARIMIDEX) 1 MG TABLET    Take 1 tablet (1 mg total) by mouth daily.   BUDESONIDE-FORMOTEROL (SYMBICORT) 160-4.5 MCG/ACT INHALER    Inhale 2 puffs into the lungs 2 (two) times daily as needed (shortness of breath).   CETIRIZINE (ZYRTEC) 10 MG TABLET    Take 10 mg by mouth at bedtime.   CHOLECALCIFEROL (VITAMIN D) 1000 UNITS TABLET    Take 1,000 Units by mouth daily.   CLONAZEPAM (KLONOPIN) 1 MG TABLET    TAKE TWO TABLETS BY MOUTH AT BEDTIME AS NEEDED FOR SLEEP   DIPHENHYDRAMINE (BENADRYL) 25 MG TABLET    Take per bottle as needed for itching   DOCUSATE SODIUM (COLACE) 100 MG CAPSULE    TAKE PER BOTTLE AS NEEDED  FOR CONSTIPATION   DULOXETINE (CYMBALTA) 60 MG CAPSULE    TAKE ONE CAPSULE BY MOUTH ONCE DAILY FOR ANXIETY   INSULIN ASPART (NOVOLOG) 100 UNIT/ML INJECTION    Inject 30 Units into the skin 3 (three) times daily with meals.   ISOSORBIDE MONONITRATE (IMDUR) 60 MG 24 HR TABLET    TAKE ONE TABLET BY MOUTH ONCE DAILY   LANCETS (ONETOUCH ULTRASOFT) LANCETS    Check blood sugar 2-3 times daily as directed   METOPROLOL SUCCINATE (TOPROL-XL) 25 MG 24 HR TABLET    TAKE ONE TABLET BY MOUTH ONCE DAILY FOR  BLOOD  PRESSURE   MULTIPLE VITAMINS-MINERALS (HAIR/SKIN/NAILS/BIOTIN PO)    Take 1 tablet by mouth every evening.   NITROGLYCERIN (NITROSTAT) 0.4 MG SL TABLET    Take one tablet under the tongue every 5 minutes as needed for chest pain   ONETOUCH VERIO TEST STRIP    Use 2-3 times daily   OXYCODONE-ACETAMINOPHEN (PERCOCET) 10-325 MG TABLET    Take 1 tablet by mouth every 6 (six) hours as needed for pain.   PANTOPRAZOLE (PROTONIX) 40 MG TABLET    TAKE ONE TABLET BY MOUTH ONCE DAILY   PRAVASTATIN (PRAVACHOL) 20 MG TABLET    TAKE ONE TABLET BY MOUTH ONCE DAILY FOR CHOLESTEROL   SERTRALINE (ZOLOFT) 25 MG TABLET    Take 1 tablet (25 mg total) by mouth at bedtime.   SODIUM CHLORIDE (OCEAN) 0.65 % SOLN NASAL SPRAY    RINSE AS NEEDED FOR SINUS CONGESTION   SPACER/AERO-HOLDING CHAMBERS (AEROCHAMBER PLUS FLO-VU LARGE) MISC    1 each by Other route once.   TEMAZEPAM (RESTORIL) 30 MG CAPSULE    TAKE ONE CAPSULE BY MOUTH AT BEDTIME AS NEEDED FOR SLEEP   TOUJEO SOLOSTAR 300 UNIT/ML SOPN  Inject 70 Units into the skin once.   ULORIC 40 MG TABLET    TAKE ONE TABLET BY MOUTH ONCE DAILY   WARFARIN (COUMADIN) 5 MG TABLET    Take 1 & 1/2  tablets daily except 1 tablet on Tuesdays, and Thursdays  Modified Medications   No medications on file  Discontinued Medications   No medications on file    Review of Systems  Unable to perform ROS: Dementia    Vitals:   03/24/16 1444  BP: 120/80  Pulse: 72  Temp: 97.4 F  (36.3 C)  TempSrc: Oral  SpO2: 98%  Height: _0  (1.702 m)   There is no height or weight on file to calculate BMI.  Physical Exam  Constitutional: She appears well-developed and well-nourished.  HENT:  Head: Head is with contusion and with laceration.    Mouth/Throat: Oropharynx is clear and moist. No oropharyngeal exudate.  Eyes: Lids are normal. Pupils are equal, round, and reactive to light. Right eye exhibits no discharge. Left eye exhibits no discharge. No scleral icterus.  Neck: Neck supple. Carotid bruit is not present. No tracheal deviation present. No thyromegaly present.  Cardiovascular: Normal rate, regular rhythm and intact distal pulses.  Exam reveals no gallop and no friction rub.   Murmur (1/6 SEM) heard. No LE edema b/l. no calf TTP.   Pulmonary/Chest: Effort normal. No stridor. No respiratory distress. She has no wheezes. She has no rales. She exhibits no tenderness.  Abdominal: Soft. Bowel sounds are normal. She exhibits no distension and no mass. There is no hepatomegaly. There is no tenderness. There is no rebound and no guarding.  obese  Musculoskeletal: She exhibits edema and tenderness.  Lymphadenopathy:    She has no cervical adenopathy.  Neurological: She is alert.  Skin: Skin is warm and dry. Bruising noted. No rash noted.  Psychiatric: Her behavior is normal. She exhibits a depressed mood.     Labs reviewed: Admission on 03/20/2016, Discharged on 03/20/2016  Component Date Value Ref Range Status  . Prothrombin Time 03/20/2016 20.2* 11.4 - 15.2 seconds Final  . INR 03/20/2016 1.70   Final  . WBC 03/20/2016 10.0  4.0 - 10.5 K/uL Final  . RBC 03/20/2016 3.78* 3.87 - 5.11 MIL/uL Final  . Hemoglobin 03/20/2016 10.0* 12.0 - 15.0 g/dL Final  . HCT 03/20/2016 33.1* 36.0 - 46.0 % Final  . MCV 03/20/2016 87.6  78.0 - 100.0 fL Final  . MCH 03/20/2016 26.5  26.0 - 34.0 pg Final  . MCHC 03/20/2016 30.2  30.0 - 36.0 g/dL Final  . RDW 03/20/2016 14.6  11.5 -  15.5 % Final  . Platelets 03/20/2016 344  150 - 400 K/uL Final  . Neutrophils Relative % 03/20/2016 68  % Final  . Neutro Abs 03/20/2016 6.8  1.7 - 7.7 K/uL Final  . Lymphocytes Relative 03/20/2016 25  % Final  . Lymphs Abs 03/20/2016 2.5  0.7 - 4.0 K/uL Final  . Monocytes Relative 03/20/2016 4  % Final  . Monocytes Absolute 03/20/2016 0.4  0.1 - 1.0 K/uL Final  . Eosinophils Relative 03/20/2016 3  % Final  . Eosinophils Absolute 03/20/2016 0.3  0.0 - 0.7 K/uL Final  . Basophils Relative 03/20/2016 0  % Final  . Basophils Absolute 03/20/2016 0.0  0.0 - 0.1 K/uL Final  . Sodium 03/20/2016 138  135 - 145 mmol/L Final  . Potassium 03/20/2016 4.2  3.5 - 5.1 mmol/L Final  . Chloride 03/20/2016 102  101 - 111  mmol/L Final  . CO2 03/20/2016 28  22 - 32 mmol/L Final  . Glucose, Bld 03/20/2016 212* 65 - 99 mg/dL Final  . BUN 03/20/2016 25* 6 - 20 mg/dL Final  . Creatinine, Ser 03/20/2016 1.44* 0.44 - 1.00 mg/dL Final  . Calcium 03/20/2016 8.8* 8.9 - 10.3 mg/dL Final  . GFR calc non Af Amer 03/20/2016 34* >60 mL/min Final  . GFR calc Af Amer 03/20/2016 39* >60 mL/min Final   Comment: (NOTE) The eGFR has been calculated using the CKD EPI equation. This calculation has not been validated in all clinical situations. eGFR's persistently <60 mL/min signify possible Chronic Kidney Disease.   . Anion gap 03/20/2016 8  5 - 15 Final  Admission on 03/15/2016, Discharged on 03/15/2016  Component Date Value Ref Range Status  . Sodium 03/15/2016 135  135 - 145 mmol/L Final  . Potassium 03/15/2016 5.1  3.5 - 5.1 mmol/L Final  . Chloride 03/15/2016 97* 101 - 111 mmol/L Final  . BUN 03/15/2016 26* 6 - 20 mg/dL Final  . Creatinine, Ser 03/15/2016 1.10* 0.44 - 1.00 mg/dL Final  . Glucose, Bld 03/15/2016 435* 65 - 99 mg/dL Final  . Calcium, Ion 03/15/2016 1.14* 1.15 - 1.40 mmol/L Final  . TCO2 03/15/2016 27  0 - 100 mmol/L Final  . Hemoglobin 03/15/2016 10.9* 12.0 - 15.0 g/dL Final  . HCT 03/15/2016 32.0*  36.0 - 46.0 % Final  . Glucose, UA 03/15/2016 500* NEGATIVE mg/dL Final  . Bilirubin Urine 03/15/2016 NEGATIVE  NEGATIVE Final  . Ketones, ur 03/15/2016 NEGATIVE  NEGATIVE mg/dL Final  . Specific Gravity, Urine 03/15/2016 1.020  1.005 - 1.030 Final  . Hgb urine dipstick 03/15/2016 NEGATIVE  NEGATIVE Final  . pH 03/15/2016 6.0  5.0 - 8.0 Final  . Protein, ur 03/15/2016 100* NEGATIVE mg/dL Final  . Urobilinogen, UA 03/15/2016 0.2  0.0 - 1.0 mg/dL Final  . Nitrite 03/15/2016 NEGATIVE  NEGATIVE Final  . Leukocytes, UA 03/15/2016 NEGATIVE  NEGATIVE Final  Office Visit on 02/21/2016  Component Date Value Ref Range Status  . INR 02/21/2016 2.1   Final  Office Visit on 02/02/2016  Component Date Value Ref Range Status  . Nitric Oxide 02/02/2016 25   Final  Clinical Support on 02/02/2016  Component Date Value Ref Range Status  . FVC-Pre 02/02/2016 1.68  L Final  . FVC-%Pred-Pre 02/02/2016 69  % Final  . FVC-Post 02/02/2016 1.75  L Final  . FVC-%Pred-Post 02/02/2016 72  % Final  . FVC-%Change-Post 02/02/2016 4  % Final  . FEV1-Pre 02/02/2016 1.36  L Final  . FEV1-%Pred-Pre 02/02/2016 73  % Final  . FEV1-Post 02/02/2016 1.43  L Final  . FEV1-%Pred-Post 02/02/2016 76  % Final  . FEV1-%Change-Post 02/02/2016 4  % Final  . FEV6-Pre 02/02/2016 1.67  L Final  . FEV6-%Pred-Pre 02/02/2016 72  % Final  . FEV6-Post 02/02/2016 1.75  L Final  . FEV6-%Pred-Post 02/02/2016 76  % Final  . FEV6-%Change-Post 02/02/2016 4  % Final  . Pre FEV1/FVC ratio 02/02/2016 81  % Final  . FEV1FVC-%Pred-Pre 02/02/2016 108  % Final  . Post FEV1/FVC ratio 02/02/2016 81  % Final  . FEV1FVC-%Change-Post 02/02/2016 0  % Final  . Pre FEV6/FVC Ratio 02/02/2016 100  % Final  . FEV6FVC-%Pred-Pre 02/02/2016 103  % Final  . Post FEV6/FVC ratio 02/02/2016 100  % Final  . FEV6FVC-%Pred-Post 02/02/2016 104  % Final  . FEV6FVC-%Change-Post 02/02/2016 0  % Final  . FEF 25-75 Pre  02/02/2016 1.38  L/sec Final  . FEF2575-%Pred-Pre  02/02/2016 88  % Final  . FEF 25-75 Post 02/02/2016 1.60  L/sec Final  . FEF2575-%Pred-Post 02/02/2016 102  % Final  . FEF2575-%Change-Post 02/02/2016 15  % Final  . DLCO unc 02/02/2016 17.83  ml/min/mmHg Final  . DLCO unc % pred 02/02/2016 62  % Final  . DLCO cor 02/02/2016 18.68  ml/min/mmHg Final  . DLCO cor % pred 02/02/2016 66  % Final  . DL/VA 02/02/2016 4.15  ml/min/mmHg/L Final  . DL/VA % pred 02/02/2016 80  % Final  Office Visit on 01/24/2016  Component Date Value Ref Range Status  . INR 01/24/2016 2.4   Final    Ct Head Wo Contrast  Result Date: 03/20/2016 CLINICAL DATA:  [place] with a laceration over the right eye. EXAM: CT HEAD WITHOUT CONTRAST TECHNIQUE: Contiguous axial images were obtained from the base of the skull through the vertex without intravenous contrast. COMPARISON:  09/24/2014 FINDINGS: Brain: Diffuse low density throughout the periventricular white matter is stable. No evidence for acute hemorrhage, mass lesion, midline shift, hydrocephalus or large infarct. Vascular: No hyperdense vessel or unexpected calcification. Skull: No fracture. Sinuses/Orbits: Sinuses are clear. Other: Scalp hematoma along the right forehead and right supraorbital region. IMPRESSION: No acute intracranial abnormality. Stable white matter changes are suggestive for chronic small vessel ischemic changes. Right frontal scalp hematoma. Electronically Signed   By: Markus Daft M.D.   On: 03/20/2016 13:09   Dg Knee Complete 4 Views Right  Result Date: 03/20/2016 CLINICAL DATA:  [place] out of bed last night.  Right leg pain. EXAM: RIGHT KNEE - COMPLETE 4+ VIEW COMPARISON:  None. FINDINGS: Advanced degenerative changes in the right knee, most pronounced in the medial and patellofemoral compartments with joint space loss and spurring. No acute bony abnormality. Specifically, no fracture, subluxation, or dislocation. Soft tissues are intact. No joint effusion. IMPRESSION: Advanced tricompartment  degenerative changes. No acute bony abnormality. Electronically Signed   By: Rolm Baptise M.D.   On: 03/20/2016 12:50   Dg Hip Unilat With Pelvis 2-3 Views Right  Result Date: 03/20/2016 CLINICAL DATA:  Fall out of bed last night.  Right hip pain. EXAM: DG HIP (WITH OR WITHOUT PELVIS) 2-3V RIGHT COMPARISON:  None. FINDINGS: Moderate degenerative changes in the hips bilaterally with joint space narrowing and spurring. No acute bony abnormality. Specifically, no fracture, subluxation, or dislocation. Soft tissues are intact. IMPRESSION: No acute bony abnormality. Electronically Signed   By: Rolm Baptise M.D.   On: 03/20/2016 12:50     Assessment/Plan   ICD-9-CM ICD-10-CM   1. Contusion of face, initial encounter 920 S00.83XA   2. Uncontrolled type 2 diabetes mellitus with chronic kidney disease, with long-term current use of insulin, unspecified CKD stage (HCC) 250.42 E11.22    585.9 Z79.4    V58.67 E11.65   3. Other chronic pulmonary embolism without acute cor pulmonale (HCC) 416.2 I27.82   4. Severe episode of recurrent major depressive disorder, without psychotic features ([place]) 296.33 F33.2   5. Chronic pain syndrome 338.4 G89.4   6. Chronic diastolic congestive heart failure (HCC) 428.32 I50.32    428.0    7. Essential hypertension 401.9 I10   8. Invasive ductal carcinoma of breast, left (HCC) 174.9 C50.912   9. Long term current use of anticoagulant therapy V58.61 Z79.01      Change lasix to prn swelling. She will call if she needs a Rx  Continue current medications as ordered  prevnar injection  given today at [place] apply cool compress to right face to reduce spread a bruise  Follow up with specialists as scheduled  Keep appt with Tivis Ringer for coumadin management  Follow up in 3 mos for routine visit.    Pharell Rolfson S. Perlie Gold  [facility] [address] [place], [place] [address] [phone] Cell  (Monday-Friday 8 AM - 5 PM) [phone] After 5 PM and follow prompts

## 2016-03-24 NOTE — Progress Notes (Signed)
Quick Notes   Health Maintenance:   Pn13 given. Due for Foot exam and Urine Micoralbumin.    Abnormal Screen:  None, Passed Clock Test; MMSE-24/30   Patient Concerns:  [name] and [name]'s daughter would like to know if there is any assistance available in getting a device for [name]'s bed, that will allow her to get herself up and out of the bed. [name] stays in bed all day long b/c she cannot get up without someone helping pull her up. [name] recently fell out of bed and hit her face on the night stand b/c she was reaching for something under the bed. They would also like to get info. On getting a Nurses Aid to come in an assist patient w/ baths, dressing etc. Informed them I will send a referral to [facility] and someone will be in contact with them.       Nurse Concerns:   [name] shows signs of depression. PQ 9 was at a 17. She does nothing for herself, is totally dependent on family, with the exception of feeding herself. She states she has no energy at all; she cannot pull/push/hold herself up. Duaghter states she will lay in bed all day long, for days at a time. [name] is taking Zoloft and Cymbalta.

## 2016-03-24 NOTE — Progress Notes (Addendum)
Subjective:   [name] is a 81 y.o. female who presents for an Initial Medicare Annual Wellness Visit.  Review of Systems    Cardiac Risk Factors include: advanced age (>5men, >41 women);diabetes mellitus;hypertension;family history of premature cardiovascular disease;sedentary lifestyle;obesity (BMI >30kg/m2)     Objective:    Today's Vitals   03/24/16 1501  BP: 120/80  Pulse: 72  Temp: 97.4 F (36.3 C)  TempSrc: Oral  SpO2: 98%  Height: 5\' 7"  (1.702 m)  PainSc: 0-No pain   There is no height or weight on file to calculate BMI.   Current Medications (verified) Outpatient Encounter Prescriptions as of 03/24/2016  Medication Sig  . albuterol (PROVENTIL HFA;VENTOLIN HFA) 108 (90 Base) MCG/ACT inhaler Inhale 1-2 puffs into the lungs every 6 (six) hours as needed for wheezing or shortness of breath.  . anastrozole (ARIMIDEX) 1 MG tablet Take 1 tablet (1 mg total) by mouth daily.  . budesonide-formoterol (SYMBICORT) 160-4.5 MCG/ACT inhaler Inhale 2 puffs into the lungs 2 (two) times daily as needed (shortness of breath).  . cetirizine (ZYRTEC) 10 MG tablet Take 10 mg by mouth at bedtime.  . cholecalciferol (VITAMIN D) 1000 units tablet Take 1,000 Units by mouth daily.  . clonazePAM (KLONOPIN) 1 MG tablet TAKE TWO TABLETS BY MOUTH AT BEDTIME AS NEEDED FOR SLEEP  . diphenhydrAMINE (BENADRYL) 25 MG tablet Take per bottle as needed for itching  . docusate sodium (COLACE) 100 MG capsule TAKE PER BOTTLE AS NEEDED FOR CONSTIPATION  . DULoxetine (CYMBALTA) 60 MG capsule TAKE ONE CAPSULE BY MOUTH ONCE DAILY FOR ANXIETY  . insulin aspart (NOVOLOG) 100 UNIT/ML injection Inject 30 Units into the skin 3 (three) times daily with meals.  . isosorbide mononitrate (IMDUR) 60 MG 24 hr tablet TAKE ONE TABLET BY MOUTH ONCE DAILY  . Lancets (ONETOUCH ULTRASOFT) lancets Check blood sugar 2-3 times daily as directed  . metoprolol succinate (TOPROL-XL) 25 MG 24 hr tablet TAKE ONE TABLET BY  MOUTH ONCE DAILY FOR  BLOOD  PRESSURE  . Multiple Vitamins-Minerals (HAIR/SKIN/NAILS/BIOTIN PO) Take 1 tablet by mouth every evening.  . nitroGLYCERIN (NITROSTAT) 0.4 MG SL tablet Take one tablet under the tongue every 5 minutes as needed for chest pain  . ONETOUCH VERIO test strip Use 2-3 times daily  . oxyCODONE-acetaminophen (PERCOCET) 10-325 MG tablet Take 1 tablet by mouth every 6 (six) hours as needed for pain.  . pantoprazole (PROTONIX) 40 MG tablet TAKE ONE TABLET BY MOUTH ONCE DAILY  . pravastatin (PRAVACHOL) 20 MG tablet TAKE ONE TABLET BY MOUTH ONCE DAILY FOR CHOLESTEROL  . sertraline (ZOLOFT) 25 MG tablet Take 1 tablet (25 mg total) by mouth at bedtime.  . sodium chloride (OCEAN) 0.65 % SOLN nasal spray RINSE AS NEEDED FOR SINUS CONGESTION  . Spacer/Aero-Holding Chambers (AEROCHAMBER PLUS FLO-VU LARGE) MISC 1 each by Other route once.  . temazepam (RESTORIL) 30 MG capsule TAKE ONE CAPSULE BY MOUTH AT BEDTIME AS NEEDED FOR SLEEP  . ULORIC 40 MG tablet TAKE ONE TABLET BY MOUTH ONCE DAILY  . warfarin (COUMADIN) 5 MG tablet Take 1 & 1/2  tablets daily except 1 tablet on Tuesdays, and Thursdays  . [DISCONTINUED] montelukast (SINGULAIR) 10 MG tablet Take 1 tablet (10 mg total) by mouth at bedtime.  Nelva Nay SOLOSTAR 300 UNIT/ML SOPN Inject 70 Units into the skin once.   No facility-administered encounter medications on file as of 03/24/2016.     Allergies (verified) Sulfonamide derivatives and Tramadol   History: Past Medical  History:  Diagnosis Date  . Allergy    takes Mucinex daily as needed  . Anemia, unspecified   . Anxiety    takes Clonazepam daily as needed  . Arthritis   . CHF (congestive heart failure) (HCC)    takes Furosemide daily  . Coronary artery disease    a. s/p IMI 2004 tx with BMS to RCA;  b. s/p Promus DES to RCA 2/12 (cath: LM 20-30%, pLAD 20-30%, mLAD 50%, RI 30%, pRCA 60%, mRCA 90% - tx with PCI);  c. myoview 1/07: EF 49%, inf scar, no isch  . Coronary  atherosclerosis of native coronary artery   . Depression    takes Cymbalta daily  . Diabetes mellitus    insulin daily  . Dysuria   . GERD (gastroesophageal reflux disease)    takes Protonix daily  . Gout, unspecified    takes Colchicine daily  . Headache    occasionally  . History of blood clots about 33yrs ago   in legs-takes Coumadin   . Hyperlipidemia    takes Pravastatin daily  . Hypertension    takes Imdur and Metoprolol daily  . Insomnia   . Joint pain   . Joint swelling   . Myocardial infarction 2004  . Numbness   . Obstructive sleep apnea   . Osteoarthritis   . Osteoarthritis   . Osteoarthrosis, unspecified whether generalized or localized, lower leg   . Pain, chronic   . Polymyalgia rheumatica ([place])   . Pulmonary emboli ([place]) 9/13   felt to need lifelong anticoagulation  . Shortness of breath dyspnea    with exertion and has Albuterol inhaler prn  . Type II or unspecified type diabetes mellitus without mention of complication, uncontrolled   . Urinary incontinence    takes Linzess daily  . Vertigo    hx of;was taking Meclizine if needed   Past Surgical History:  Procedure Laterality Date  . ABDOMINAL HYSTERECTOMY     partial  . APPENDECTOMY    . blood clots/legs and lungs  2013  . BREAST LUMPECTOMY WITH RADIOACTIVE SEED LOCALIZATION Left 11/05/2014   Procedure: LEFT BREAST LUMPECTOMY WITH RADIOACTIVE SEED LOCALIZATION;  Surgeon: [name], MD;  Location: [facility];  Service: General;  Laterality: Left;  . CARDIAC CATHETERIZATION    . COLONOSCOPY    . CORONARY ANGIOPLASTY  2  . EXCISION OF SKIN TAG Right 11/05/2014   Procedure: EXCISION OF RIGHT EYELID SKIN TAG;  Surgeon: [name], MD;  Location: [facility];  Service: General;  Laterality: Right;  . EYE SURGERY Bilateral    cataract   . GASTRIC BYPASS  1977    reversed in 1979, [place] N/A 06/29/2014   Procedure: LEFT HEART CATHETERIZATION  WITH CORONARY ANGIOGRAM;  Surgeon: Troy Sine, MD;  Location: [facility] CATH LAB;  Service: Cardiovascular;  Laterality: N/A;  . MI with stent placement  2004   Family History  Problem Relation Age of Onset  . Breast cancer Mother 48  . Heart disease Mother   . Throat cancer Father   . Hypertension Father   . Arthritis Father   . Diabetes Father   . Arthritis Sister   . Obesity Sister   . Diabetes Sister   . Heart disease Cousin    Social History   Occupational History  . retired    Social History Main Topics  . Smoking status: Never Smoker  . Smokeless tobacco: Never Used  .  Alcohol use No  . Drug use: No  . Sexual activity: Not Currently    Tobacco Counseling Counseling given: No   Activities of Daily Living In your present state of health, do you have any difficulty performing the following activities: 03/24/2016  Hearing? Y  Vision? Y  Difficulty concentrating or making decisions? Y  Walking or climbing stairs? Y  Dressing or bathing? Y  Doing errands, shopping? Y  Preparing Food and eating ? Y  Using the Toilet? Y  In the past six months, have you accidently leaked urine? Y  Do you have problems with loss of bowel control? N  Managing your Medications? N  Managing your Finances? Y  Housekeeping or managing your Housekeeping? Y  Some recent data might be hidden    Immunizations and Health Maintenance Immunization History  Administered Date(s) Administered  . Influenza Whole 03/14/2007, 02/13/2008, 01/18/2010  . Influenza,inj,Quad PF,36+ Mos 01/08/2013, 02/18/2014, 01/18/2015, 01/24/2016  . Influenza-Unspecified 02/15/2012  . Pneumococcal Polysaccharide-23 05/08/2002  . Td 05/08/2008   Health Maintenance Due  Topic Date Due  . ZOSTAVAX  10/15/1996  . PNA vac Low Risk Adult (2 of 2 - PCV13) 05/09/2003  . OPHTHALMOLOGY EXAM  05/27/2015  . URINE MICROALBUMIN  06/05/2015  . FOOT EXAM  12/04/2015    Patient Care Team: Gildardo Cranker, DO as PCP - General  (Internal Medicine) Renella Cunas, MD (Inactive) as Consulting Physician (Cardiology) [name], MD as Consulting Physician (Ophthalmology) [name], MD as Consulting Physician (General Surgery)  Indicate any recent Medical Services you may have received from other than [facility] providers in the past year (date may be approximate).     Assessment:   This is a routine wellness examination for Karishma.  Hearing/Vision screen Hearing Screening Comments: Last hearing screen done 2 weeks. (cannot remember Md).  Vision Screening Comments: Last eye exam done today.   Dietary issues and exercise activities discussed: Current Exercise Habits: The patient does not participate in regular exercise at present, Exercise limited by: orthopedic condition(s)  Goals    . Increase physical activity          Starting 03/24/16,  I will attempt to get out of bed daily for at least 30 min at a time.       Depression Screen PHQ 2/9 Scores 03/24/2016 04/21/2015 10/27/2014 10/14/2014 09/23/2014 03/09/2014 12/23/2012  PHQ - 2 Score 3 6 0 3 3 0 0  PHQ- 9 Score 17 19 - 15 14 - -    Fall Risk Fall Risk  03/24/2016 03/24/2016 10/27/2015 10/11/2015 09/24/2015  Falls in the past year? Yes Yes No No No  Number falls in past yr: 1 1 - - -  Injury with Fall? Yes Yes - - -  Risk for fall due to : Impaired balance/gait;Impaired mobility - - - -    Cognitive Function: MMSE - Mini Mental State Exam 03/24/2016 06/04/2013  Orientation to time 5 4  Orientation to Place 5 5  Registration 3 3  Attention/ Calculation 0 4  Recall 2 2  Language- name 2 objects 2 2  Language- repeat 1 1  Language- follow 3 step command 3 3  Language- read & follow direction 1 1  Write a sentence 1 1  Copy design 1 0  Total score 24 26        Screening Tests Health Maintenance  Topic Date Due  . ZOSTAVAX  10/15/1996  . PNA vac Low Risk Adult (2 of 2 - PCV13) 05/09/2003  .  OPHTHALMOLOGY EXAM  05/27/2015  . URINE MICROALBUMIN   06/05/2015  . FOOT EXAM  12/04/2015  . HEMOGLOBIN A1C  06/07/2016  . MAMMOGRAM  07/28/2016  . TETANUS/TDAP  05/08/2018  . INFLUENZA VACCINE  Completed  . DEXA SCAN  Completed      Plan:    I have personally reviewed and addressed the Medicare Annual Wellness questionnaire and have noted the following in the patient's chart:  A. Medical and social history B. Use of alcohol, tobacco or illicit drugs  C. Current medications and supplements D. Functional ability and status E.  Nutritional status F.  Physical activity G. Advance directives H. List of other physicians I.  Hospitalizations, surgeries, and ER visits in previous 12 months J.  [place] to include hearing, vision, cognitive, depression L. Referrals and appointments - none  In addition, I have reviewed and discussed with patient certain preventive protocols, quality metrics, and best practice recommendations. A written personalized care plan for preventive services as well as general preventive health recommendations were provided to patient.  See attached scanned questionnaire for additional information.   Signed,   Allyn Kenner, LPN Health Advisor  I have reviewed the health advisor's note and was available for consultation. I agree with documentation and plan.   Monica S. Perlie Gold  [facility] [name] [place], [place] [address] [phone] Cell (Monday-Friday 8 AM - 5 PM) [phone] After 5 PM and follow prompts

## 2016-03-24 NOTE — Patient Instructions (Addendum)
Continue current medications as ordered  prevnar injection given today  Recommend apply cool compress to right face to reduce spread a bruise  Follow up with specialists as scheduled  Keep appt with Tivis Ringer for coumadin management  Follow up in 3 mos for routine visit.

## 2016-03-24 NOTE — Patient Instructions (Signed)
Ms. Haider , Thank you for taking time to come for your Medicare Wellness Visit. I appreciate your ongoing commitment to your health goals. Please review the following plan we discussed and let me know if I can assist you in the future.   These are the goals we discussed: Goals    . Increase physical activity          Starting 03/24/16,  I will attempt to get out of bed daily for at least 30 min at a time.        This is a list of the screening recommended for you and due dates:  Health Maintenance  Topic Date Due  . Shingles Vaccine  10/15/1996  . Pneumonia vaccines (2 of 2 - PCV13) 05/09/2003  . Eye exam for diabetics  05/27/2015  . Urine Protein Check  06/05/2015  . Complete foot exam   12/04/2015  . Hemoglobin A1C  06/07/2016  . Mammogram  07/28/2016  . Tetanus Vaccine  05/08/2018  . Flu Shot  Completed  . DEXA scan (bone density measurement)  Completed  Preventive Care for Adults  A healthy lifestyle and preventive care can promote health and wellness. Preventive health guidelines for adults include the following key practices.  . A routine yearly physical is a good way to check with your health care provider about your health and preventive screening. It is a chance to share any concerns and updates on your health and to receive a thorough exam.  . Visit your dentist for a routine exam and preventive care every 6 months. Brush your teeth twice a day and floss once a day. Good oral hygiene prevents tooth decay and gum disease.  . The frequency of eye exams is based on your age, health, family medical history, use  of contact lenses, and other factors. Follow your health care provider's ecommendations for frequency of eye exams.  . Eat a healthy diet. Foods like vegetables, fruits, whole grains, low-fat dairy products, and lean protein foods contain the nutrients you need without too many calories. Decrease your intake of foods high in solid fats, added sugars, and salt. Eat  the right amount of calories for you. Get information about a proper diet from your health care provider, if necessary.  . Regular physical exercise is one of the most important things you can do for your health. Most adults should get at least 150 minutes of moderate-intensity exercise (any activity that increases your heart rate and causes you to sweat) each week. In addition, most adults need muscle-strengthening exercises on 2 or more days a week.  Silver Sneakers may be a benefit available to you. To determine eligibility, you may visit the website: www.silversneakers.com or contact program at [phone] Mon-Fri between 8AM-8PM.   . Maintain a healthy weight. The body mass index (BMI) is a screening tool to identify possible weight problems. It provides an estimate of body fat based on height and weight. Your health care provider can find your BMI and can help you achieve or maintain a healthy weight.   For adults 20 years and older: ? A BMI below 18.5 is considered underweight. ? A BMI of 18.5 to 24.9 is normal. ? A BMI of 25 to 29.9 is considered overweight. ? A BMI of 30 and above is considered obese.   . Maintain normal blood lipids and cholesterol levels by exercising and minimizing your intake of saturated fat. Eat a balanced diet with plenty of fruit and vegetables. Blood tests for  lipids and cholesterol should begin at age 61 and be repeated every 5 years. If your lipid or cholesterol levels are high, you are over 50, or you are at high risk for heart disease, you may need your cholesterol levels checked more frequently. Ongoing high lipid and cholesterol levels should be treated with medicines if diet and exercise are not working.  . If you smoke, find out from your health care provider how to quit. If you do not use tobacco, please do not start.  . If you choose to drink alcohol, please do not consume more than 2 drinks per day. One drink is considered to be 12 ounces (355 mL)  of beer, 5 ounces (148 mL) of wine, or 1.5 ounces (44 mL) of liquor.  . If you are 41-33 years old, ask your health care provider if you should take aspirin to prevent strokes.  . Use sunscreen. Apply sunscreen liberally and repeatedly throughout the day. You should seek shade when your shadow is shorter than you. Protect yourself by wearing long sleeves, pants, a wide-brimmed hat, and sunglasses year round, whenever you are outdoors.  . Once a month, do a whole body skin exam, using a mirror to look at the skin on your back. Tell your health care provider of new moles, moles that have irregular borders, moles that are larger than a pencil eraser, or moles that have changed in shape or color.

## 2016-03-27 ENCOUNTER — Encounter: Payer: Self-pay | Admitting: Pharmacotherapy

## 2016-03-27 ENCOUNTER — Ambulatory Visit (INDEPENDENT_AMBULATORY_CARE_PROVIDER_SITE_OTHER): Payer: PPO | Admitting: Pharmacotherapy

## 2016-03-27 VITALS — BP 134/80 | HR 76 | Ht 67.0 in | Wt 342.0 lb

## 2016-03-27 DIAGNOSIS — I2782 Chronic pulmonary embolism: Secondary | ICD-10-CM

## 2016-03-27 DIAGNOSIS — Z7901 Long term (current) use of anticoagulants: Secondary | ICD-10-CM

## 2016-03-27 LAB — POCT INR: INR: 1.5

## 2016-03-27 NOTE — Progress Notes (Signed)
   Subjective:    Patient ID: [name], female    DOB: 05/09/1934, 81 y.o.   MRN: [id number]  HPI Last INR on 03/20/16 was low at 1.7. Current Coumadin dose is 7.5mg  QD except 5mg  T/Th She fell out of bed and hit nightstand.  Went to ER, saw Dr. Eulas Post, and has had a visit with eye doctor. She missed at least 2 doses of Coumadin during all the trips to the doctor and the hospital Denies CP, palpitations Consistent with vitamin K intake.  She says her BG are "fairly well".  Says her morning BG in the 80's.  She has cut out bedtime snacks.   Review of Systems  HENT: Negative for nosebleeds.   Eyes: Negative for pain.  Respiratory: Negative for shortness of breath.   Cardiovascular: Negative for chest pain.  Gastrointestinal: Negative for anal bleeding and blood in stool.  Genitourinary: Negative for hematuria.  Hematological: Bruises/bleeds easily.       Objective:   Physical Exam  Constitutional: She is oriented to person, place, and time. She appears well-developed and well-nourished.  HENT:  Right Ear: External ear normal.  Left Ear: External ear normal.  Eyes:  Bruising around right eye.  Cardiovascular: Normal rate, regular rhythm and normal heart sounds.   Pulmonary/Chest: Effort normal and breath sounds normal.  Neurological: She is alert and oriented to person, place, and time.  Skin: Skin is warm and dry.  Psychiatric: She has a normal mood and affect. Her behavior is normal. Judgment and thought content normal.  Vitals reviewed.    BP:  134/80  HR: 76  Wt: 342 INR 1.5     Assessment & Plan:  1.  INR below goal 2-3 due to missed doses. 2.  Tomorrow only - take 7.5mg  as a booster. 3.  Then resume Coumadin 7.5mg  QD except 5mg  T/Th 4.  RTC 1 month

## 2016-03-27 NOTE — Patient Instructions (Signed)
INR 1.5 Tomorrow only - take 7.5mg  (1 & 1/2 tablets) Then resume Coumadin 7.5mg  (1 & 1/2 tablets) daily except 5mg  (1 tablet) on Tuesdays and Thursdays

## 2016-04-12 ENCOUNTER — Ambulatory Visit: Payer: PPO | Admitting: Hematology

## 2016-04-12 ENCOUNTER — Other Ambulatory Visit: Payer: PPO

## 2016-04-14 ENCOUNTER — Other Ambulatory Visit: Payer: Self-pay | Admitting: *Deleted

## 2016-04-14 DIAGNOSIS — G894 Chronic pain syndrome: Secondary | ICD-10-CM

## 2016-04-14 MED ORDER — OXYCODONE-ACETAMINOPHEN 10-325 MG PO TABS: 1.0000 | ORAL_TABLET | Freq: Four times a day (QID) | ORAL | 0 refills | Status: DC | PRN

## 2016-04-14 NOTE — Telephone Encounter (Signed)
Patient requested and will pick up 

## 2016-04-21 ENCOUNTER — Telehealth: Payer: Self-pay

## 2016-04-21 NOTE — Telephone Encounter (Signed)
A fax was received from [employer] that requested information regarding patient's diabetes. The form is a certificate of medical necessity for therapeutic shoes and inserts.   [employer]  [address] [place] [address] Phone: [phone] Fax: [phone]  Patient did not answer the phone and no voicemail picked up so I was unable to leave a message.

## 2016-04-24 ENCOUNTER — Ambulatory Visit (INDEPENDENT_AMBULATORY_CARE_PROVIDER_SITE_OTHER): Payer: PPO | Admitting: Pharmacotherapy

## 2016-04-24 VITALS — BP 138/78 | HR 71 | Temp 97.3°F | Ht 67.0 in | Wt 344.0 lb

## 2016-04-24 DIAGNOSIS — E1100 Type 2 diabetes mellitus with hyperosmolarity without nonketotic hyperglycemic-hyperosmolar coma (NKHHC): Secondary | ICD-10-CM

## 2016-04-24 DIAGNOSIS — I2782 Chronic pulmonary embolism: Secondary | ICD-10-CM | POA: Diagnosis not present

## 2016-04-24 DIAGNOSIS — Z7901 Long term (current) use of anticoagulants: Secondary | ICD-10-CM

## 2016-04-24 DIAGNOSIS — Z794 Long term (current) use of insulin: Secondary | ICD-10-CM

## 2016-04-24 LAB — POCT INR: INR: 2.8

## 2016-04-24 NOTE — Progress Notes (Signed)
   Subjective:    Patient ID: [name], female    DOB: 1935-03-03, 81 y.o.   MRN: [id number]  HPI Last INR on 03/27/16 was low at 1.5 Current Coumadin dose is 7.5mg  QD except 5mg  T/Th Denies missed doses. Denies unusual bleeding or bruising. Denies CP, SOB, falls Consistent with vitamin K.  Average BG:  97mg /dl    Review of Systems  HENT: Negative for nosebleeds.   Cardiovascular: Negative for chest pain and palpitations.  Gastrointestinal: Negative for anal bleeding and blood in stool.  Genitourinary: Negative for hematuria.  Hematological: Does not bruise/bleed easily.       Objective:   Physical Exam  Constitutional: She is oriented to person, place, and time. She appears well-developed and well-nourished.  HENT:  Right Ear: External ear normal.  Left Ear: External ear normal.  Cardiovascular: Normal rate, regular rhythm and normal heart sounds.   Pulmonary/Chest: Effort normal and breath sounds normal.  Neurological: She is alert and oriented to person, place, and time.  Skin: Skin is warm and dry.  Psychiatric: She has a normal mood and affect. Her behavior is normal. Judgment and thought content normal.  Vitals reviewed.  BP:  138/78  HR:  71  Wt:  344lb INR 2.7       Assessment & Plan:  1.  INR 2-3. 2.  Continue Coumadin 7.5mg  QD except 5mg  T/Th 3.  Will check A1C today. 4.  RTC in 1 month

## 2016-04-24 NOTE — Patient Instructions (Signed)
INR 2.7 Continue Coumadin 7.5mg  daily except 5mg  on Tuesdays and Thursdays

## 2016-04-25 DIAGNOSIS — E119 Type 2 diabetes mellitus without complications: Secondary | ICD-10-CM | POA: Diagnosis not present

## 2016-04-25 DIAGNOSIS — H35371 Puckering of macula, right eye: Secondary | ICD-10-CM | POA: Diagnosis not present

## 2016-04-25 DIAGNOSIS — H43821 Vitreomacular adhesion, right eye: Secondary | ICD-10-CM | POA: Diagnosis not present

## 2016-04-25 DIAGNOSIS — H43391 Other vitreous opacities, right eye: Secondary | ICD-10-CM | POA: Diagnosis not present

## 2016-04-25 LAB — HEMOGLOBIN A1C
Hgb A1c MFr Bld: 8.6 % — ABNORMAL HIGH (ref <5.7)
Mean Plasma Glucose: 200 mg/dL

## 2016-04-25 LAB — COMPLETE METABOLIC PANEL WITH GFR
ALT: 8 U/L (ref 6–29)
AST: 12 U/L (ref 10–35)
Albumin: 3.3 g/dL — ABNORMAL LOW (ref 3.6–5.1)
Alkaline Phosphatase: 73 U/L (ref 33–130)
BUN: 28 mg/dL — ABNORMAL HIGH (ref 7–25)
CO2: 24 mmol/L (ref 20–31)
Calcium: 8.6 mg/dL (ref 8.6–10.4)
Chloride: 105 mmol/L (ref 98–110)
Creat: 1.22 mg/dL — ABNORMAL HIGH (ref 0.60–0.93)
GFR, Est African American: 49 mL/min — ABNORMAL LOW (ref >=60)
GFR, Est Non African American: 42 mL/min — ABNORMAL LOW (ref >=60)
Glucose, Bld: 148 mg/dL — ABNORMAL HIGH (ref 65–99)
Potassium: 4.4 mmol/L (ref 3.5–5.3)
Sodium: 139 mmol/L (ref 135–146)
Total Bilirubin: 0.3 mg/dL (ref 0.2–1.2)
Total Protein: 7.4 g/dL (ref 6.1–8.1)

## 2016-04-25 LAB — HM DIABETES EYE EXAM

## 2016-04-27 ENCOUNTER — Encounter: Payer: Self-pay | Admitting: *Deleted

## 2016-04-30 ENCOUNTER — Other Ambulatory Visit: Payer: Self-pay | Admitting: Internal Medicine

## 2016-05-15 ENCOUNTER — Other Ambulatory Visit: Payer: Self-pay | Admitting: *Deleted

## 2016-05-15 DIAGNOSIS — G894 Chronic pain syndrome: Secondary | ICD-10-CM

## 2016-05-15 MED ORDER — OXYCODONE-ACETAMINOPHEN 10-325 MG PO TABS: 1.0000 | ORAL_TABLET | Freq: Four times a day (QID) | ORAL | 0 refills | Status: DC | PRN

## 2016-05-15 NOTE — Telephone Encounter (Signed)
Patient requested and will pick up 

## 2016-05-16 ENCOUNTER — Telehealth: Payer: Self-pay

## 2016-05-16 NOTE — Telephone Encounter (Signed)
I called patient to let her know that she has a prescription ready to be picked up at the office. Rx is for oxycodone/apap 10-325 mg, # 120.   Prescription was placed in filing cabinet at front desk.

## 2016-05-22 ENCOUNTER — Ambulatory Visit (INDEPENDENT_AMBULATORY_CARE_PROVIDER_SITE_OTHER): Payer: PPO | Admitting: Pharmacotherapy

## 2016-05-22 ENCOUNTER — Encounter: Payer: Self-pay | Admitting: Pharmacotherapy

## 2016-05-22 VITALS — BP 128/80 | HR 86 | Ht 67.0 in | Wt 339.4 lb

## 2016-05-22 DIAGNOSIS — Z7901 Long term (current) use of anticoagulants: Secondary | ICD-10-CM | POA: Diagnosis not present

## 2016-05-22 DIAGNOSIS — I2782 Chronic pulmonary embolism: Secondary | ICD-10-CM

## 2016-05-22 LAB — POCT INR: INR: 2.2

## 2016-05-22 NOTE — Progress Notes (Signed)
   Subjective:    Patient ID: [name], female    DOB: 03-Jun-1934, 82 y.o.   MRN: [id number]  HPI Last INR on 04/24/16 was OK at 2.8 Denies missed doses. Denies unusual bruising. Has been having vaginal bleeding - seeing GYN. Denies CP, SOB, falls Consistent with vitamin K intake.   Review of Systems  HENT: Negative for nosebleeds.   Respiratory: Negative for shortness of breath.   Cardiovascular: Negative for chest pain.  Gastrointestinal: Negative for anal bleeding and blood in stool.  Genitourinary: Positive for vaginal bleeding. Negative for hematuria.  Hematological: Does not bruise/bleed easily.       Objective:   Physical Exam  Constitutional: She is oriented to person, place, and time. She appears well-developed and well-nourished.  HENT:  Right Ear: External ear normal.  Left Ear: External ear normal.  Cardiovascular: Normal rate, regular rhythm and normal heart sounds.   Pulmonary/Chest: Effort normal and breath sounds normal.  Neurological: She is alert and oriented to person, place, and time.  Skin: Skin is warm and dry.  Psychiatric: She has a normal mood and affect. Her behavior is normal. Judgment and thought content normal.  Vitals reviewed.  BP: 128/80  HR: 86 wt: 339.4lb INR 2.2       Assessment & Plan:  1.  INR at goal 2-3 2.  Continue current Coumadin dose 3.  RTC 1 month

## 2016-05-22 NOTE — Patient Instructions (Signed)
INR 2.2 Continue same dose coumadin

## 2016-06-02 ENCOUNTER — Other Ambulatory Visit: Payer: Self-pay | Admitting: Internal Medicine

## 2016-06-07 ENCOUNTER — Other Ambulatory Visit: Payer: Self-pay | Admitting: Internal Medicine

## 2016-06-15 ENCOUNTER — Other Ambulatory Visit: Payer: Self-pay | Admitting: Internal Medicine

## 2016-06-15 ENCOUNTER — Other Ambulatory Visit: Payer: Self-pay

## 2016-06-15 DIAGNOSIS — F332 Major depressive disorder, recurrent severe without psychotic features: Secondary | ICD-10-CM

## 2016-06-15 DIAGNOSIS — G894 Chronic pain syndrome: Secondary | ICD-10-CM

## 2016-06-15 MED ORDER — OXYCODONE-ACETAMINOPHEN 10-325 MG PO TABS: 1.0000 | ORAL_TABLET | Freq: Four times a day (QID) | ORAL | 0 refills | Status: DC | PRN

## 2016-06-23 ENCOUNTER — Encounter: Payer: Self-pay | Admitting: Internal Medicine

## 2016-06-23 ENCOUNTER — Ambulatory Visit (INDEPENDENT_AMBULATORY_CARE_PROVIDER_SITE_OTHER): Payer: PPO | Admitting: Internal Medicine

## 2016-06-23 VITALS — BP 136/78 | HR 73 | Temp 98.4°F | Ht 67.0 in | Wt 339.6 lb

## 2016-06-23 DIAGNOSIS — N840 Polyp of corpus uteri: Secondary | ICD-10-CM

## 2016-06-23 DIAGNOSIS — Z794 Long term (current) use of insulin: Secondary | ICD-10-CM

## 2016-06-23 DIAGNOSIS — I2782 Chronic pulmonary embolism: Secondary | ICD-10-CM | POA: Diagnosis not present

## 2016-06-23 DIAGNOSIS — I5032 Chronic diastolic (congestive) heart failure: Secondary | ICD-10-CM | POA: Diagnosis not present

## 2016-06-23 DIAGNOSIS — E782 Mixed hyperlipidemia: Secondary | ICD-10-CM

## 2016-06-23 DIAGNOSIS — I10 Essential (primary) hypertension: Secondary | ICD-10-CM

## 2016-06-23 DIAGNOSIS — G894 Chronic pain syndrome: Secondary | ICD-10-CM | POA: Diagnosis not present

## 2016-06-23 DIAGNOSIS — E1100 Type 2 diabetes mellitus with hyperosmolarity without nonketotic hyperglycemic-hyperosmolar coma (NKHHC): Secondary | ICD-10-CM | POA: Diagnosis not present

## 2016-06-23 DIAGNOSIS — F332 Major depressive disorder, recurrent severe without psychotic features: Secondary | ICD-10-CM | POA: Insufficient documentation

## 2016-06-23 DIAGNOSIS — N760 Acute vaginitis: Secondary | ICD-10-CM | POA: Diagnosis not present

## 2016-06-23 HISTORY — DX: Acute vaginitis: N76.0

## 2016-06-23 LAB — LIPID PANEL
Cholesterol: 102 mg/dL (ref <200)
HDL: 36 mg/dL — ABNORMAL LOW (ref >50)
LDL Cholesterol: 47 mg/dL (ref <100)
Total CHOL/HDL Ratio: 2.8 Ratio (ref <5.0)
Triglycerides: 95 mg/dL (ref <150)
VLDL: 19 mg/dL (ref <30)

## 2016-06-23 MED ORDER — SERTRALINE HCL 50 MG PO TABS: 50.0000 mg | ORAL_TABLET | Freq: Every day | ORAL | 6 refills | Status: DC

## 2016-06-23 NOTE — Patient Instructions (Addendum)
Increase sertraline 50mg  at bedtime  Continue other medications as ordered  Follow up with Tivis Ringer as scheduled  Will call with referal to GYN  Will call with lab result  Follow up in 3 mos for routine visit

## 2016-06-23 NOTE — Progress Notes (Signed)
Patient ID: [name], female   DOB: 23-Dec-1934, 82 y.o.   MRN: [id number]    Location:  [name] Place of Service: OFFICE  Chief Complaint  Patient presents with  . Medical Management of Chronic Issues    3 months routine visit, here with [name] daughters     HPI:  82 yo female seen today for f/u. She reports feeling well overall. She has occasional gout flare that diet associated.  Gout - stable on uloric. Occasional flares  CAD/hyperlipidemia/HTN - stable on imdur, metoprolol. Has not needed SL NTG. Takes lasix prn swelling  Chronic bronchitis - reports SOB and not able to move around as much. She uses HFA prn and symbicort.   PE - on coumadin tx. No bleeding. Last INR 2.2 and coumadin not adjusted. No bleeding. GOAL INR 2-3  Chronic pain syndrome/PMR/OA - stable overall on percocet. She reports increased pain in [name] chest wall that radiates from left breast to right. She is taking cymbalta also  Insomnia/depression/anxiety - not sleeping well even with medication restoril 30mg . She gets about 4 hrs per night which is an improvement. She still takes klonopin and sertraline 25mg  qhs. Takes cymbalta also  GERD - stable on PPI  DM - BS at home in 100s. No low BS reactions. She is making healthy food choices. She injects 70 units (instead of 60 due to cost of med) Toujeo BID. She has not completed Toujeo Rx assistance forms yet. She takes 30 units humalog BID at least. She does not eat 3 full meals per day but is getting 2 per day. She c/o intermittent pain with numbness/tingling in hands and feet. She is unable to grasp and hold objects in right hand.   Breast CA - stable; takes arimidex daily. Followed by oncology  vaginal d/c and itching - improved after topical/intravaginal tx. brown d/c. Has foul smelling urine and urine is brown colored. No dysuria, hematuria, urinary frequency/urgency. 1 episode of difficulty starting urine stream but none since. No abdominal pain, N/V, f/c. She  completed 7 days of cipro and diflucan tx which helped. She has not seen gyn in several mos and was supposed to have d&c for endometrial polyp but never followed through. She was seeing [name]  She is a poor historian due to memory issues. Hx obtained from daughter.     Past Medical History:  Diagnosis Date  . Allergy    takes Mucinex daily as needed  . Anemia, unspecified   . Anxiety    takes Clonazepam daily as needed  . Arthritis   . CHF (congestive heart failure) (HCC)    takes Furosemide daily  . Coronary artery disease    a. s/p IMI 2004 tx with BMS to RCA;  b. s/p Promus DES to RCA 2/12 (cath: LM 20-30%, pLAD 20-30%, mLAD 50%, RI 30%, pRCA 60%, mRCA 90% - tx with PCI);  c. myoview 1/07: EF 49%, inf scar, no isch  . Coronary atherosclerosis of native coronary artery   . Depression    takes Cymbalta daily  . Diabetes mellitus    insulin daily  . Dysuria   . GERD (gastroesophageal reflux disease)    takes Protonix daily  . Gout, unspecified    takes Colchicine daily  . Headache    occasionally  . History of blood clots about 37yrs ago   in legs-takes Coumadin   . Hyperlipidemia    takes Pravastatin daily  . Hypertension    takes Imdur and Metoprolol  daily  . Insomnia   . Joint pain   . Joint swelling   . Myocardial infarction 2004  . Numbness   . Obstructive sleep apnea   . Osteoarthritis   . Osteoarthritis   . Osteoarthrosis, unspecified whether generalized or localized, lower leg   . Pain, chronic   . Polymyalgia rheumatica ([place])   . Pulmonary emboli ([place]) 9/13   felt to need lifelong anticoagulation  . Shortness of breath dyspnea    with exertion and has Albuterol inhaler prn  . Type II or unspecified type diabetes mellitus without mention of complication, uncontrolled   . Urinary incontinence    takes Linzess daily  . Vertigo    hx of;was taking Meclizine if needed    Past Surgical History:  Procedure Laterality Date  . ABDOMINAL HYSTERECTOMY      partial  . APPENDECTOMY    . blood clots/legs and lungs  2013  . BREAST LUMPECTOMY WITH RADIOACTIVE SEED LOCALIZATION Left 11/05/2014   Procedure: LEFT BREAST LUMPECTOMY WITH RADIOACTIVE SEED LOCALIZATION;  Surgeon: [name], MD;  Location: [facility];  Service: General;  Laterality: Left;  . CARDIAC CATHETERIZATION    . COLONOSCOPY    . CORONARY ANGIOPLASTY  2  . EXCISION OF SKIN TAG Right 11/05/2014   Procedure: EXCISION OF RIGHT EYELID SKIN TAG;  Surgeon: [name], MD;  Location: [place] [facility];  Service: General;  Laterality: Right;  . EYE SURGERY Bilateral    cataract   . GASTRIC BYPASS  1977    reversed in 1979, [place] N/A 06/29/2014   Procedure: LEFT HEART CATHETERIZATION WITH CORONARY ANGIOGRAM;  Surgeon: Troy Sine, MD;  Location: [facility] CATH LAB;  Service: Cardiovascular;  Laterality: N/A;  . MI with stent placement  2004    Patient Care Team: Gildardo Cranker, DO as PCP - General (Internal Medicine) Renella Cunas, MD (Inactive) as Consulting Physician (Cardiology) [name], MD as Consulting Physician (Ophthalmology) [name], MD as Consulting Physician (General Surgery)  Social History   Social History  . Marital status: Widowed    Spouse name: N/A  . Number of children: N/A  . Years of education: N/A   Occupational History  . retired    Social History Main Topics  . Smoking status: Never Smoker  . Smokeless tobacco: Never Used  . Alcohol use No  . Drug use: No  . Sexual activity: Not Currently   Other Topics Concern  . Not on file   Social History Narrative  . No narrative on file     reports that she has never smoked. She has never used smokeless tobacco. She reports that she does not drink alcohol or use drugs.  Family History  Problem Relation Age of Onset  . Breast cancer Mother 62  . Heart disease Mother   . Throat cancer Father   . Hypertension Father   . Arthritis Father     . Diabetes Father   . Arthritis Sister   . Obesity Sister   . Diabetes Sister   . Heart disease Cousin    Family Status  Relation Status  . Mother Deceased at age 2   Cause of Death: Breast cancer  . Father Deceased at age 5   Cause of Death: Complications of diabetes & HTN  . Sister Deceased at age 44  . Sister Alive  . Daughter Alive  . Son Alive  . Sister Alive  . Sister Alive  .  Brother Alive  . Daughter Alive  . Maternal Grandmother Deceased  . Maternal Grandfather Deceased  . Paternal Grandmother Deceased  . Paternal Grandfather Deceased  . Cousin Deceased     Allergies  Allergen Reactions  . Sulfonamide Derivatives Swelling    Mouth swelling  . Tramadol Nausea And Vomiting    Medications: Patient's Medications  New Prescriptions   No medications on file  Previous Medications   ALBUTEROL (PROVENTIL HFA;VENTOLIN HFA) 108 (90 BASE) MCG/ACT INHALER    Inhale 1-2 puffs into the lungs every 6 (six) hours as needed for wheezing or shortness of breath.   ANASTROZOLE (ARIMIDEX) 1 MG TABLET    Take 1 tablet (1 mg total) by mouth daily.   BUDESONIDE-FORMOTEROL (SYMBICORT) 160-4.5 MCG/ACT INHALER    Inhale 2 puffs into the lungs 2 (two) times daily as needed (shortness of breath).   CETIRIZINE (ZYRTEC) 10 MG TABLET    Take 10 mg by mouth at bedtime.   CHOLECALCIFEROL (VITAMIN D) 1000 UNITS TABLET    Take 1,000 Units by mouth daily.   CLONAZEPAM (KLONOPIN) 1 MG TABLET    TAKE TWO TABLETS BY MOUTH AT BEDTIME AS NEEDED FOR SLEEP   DIPHENHYDRAMINE (BENADRYL) 25 MG TABLET    Take per bottle as needed for itching   DOCUSATE SODIUM (COLACE) 100 MG CAPSULE    TAKE PER BOTTLE AS NEEDED FOR CONSTIPATION   DULOXETINE (CYMBALTA) 60 MG CAPSULE    TAKE ONE CAPSULE BY MOUTH ONCE DAILY FOR ANXIETY   INSULIN ASPART (NOVOLOG) 100 UNIT/ML INJECTION    Inject 30 Units into the skin 3 (three) times daily with meals.   ISOSORBIDE MONONITRATE (IMDUR) 60 MG 24 HR TABLET    TAKE ONE TABLET BY  MOUTH ONCE DAILY   LANCETS (ONETOUCH ULTRASOFT) LANCETS    Check blood sugar 2-3 times daily as directed   METOPROLOL SUCCINATE (TOPROL-XL) 25 MG 24 HR TABLET    TAKE ONE TABLET BY MOUTH ONCE DAILY FOR  BLOOD  PRESSURE   MULTIPLE VITAMINS-MINERALS (HAIR/SKIN/NAILS/BIOTIN PO)    Take 1 tablet by mouth every evening.   NITROGLYCERIN (NITROSTAT) 0.4 MG SL TABLET    Take one tablet under the tongue every 5 minutes as needed for chest pain   ONETOUCH VERIO TEST STRIP    Use 2-3 times daily   OXYCODONE-ACETAMINOPHEN (PERCOCET) 10-325 MG TABLET    Take 1 tablet by mouth every 6 (six) hours as needed for pain.   PANTOPRAZOLE (PROTONIX) 40 MG TABLET    TAKE ONE TABLET BY MOUTH ONCE DAILY   PRAVASTATIN (PRAVACHOL) 20 MG TABLET    TAKE ONE TABLET BY MOUTH ONCE DAILY FOR  CHOLESTEROL   SERTRALINE (ZOLOFT) 25 MG TABLET    TAKE ONE TABLET BY MOUTH AT BEDTIME   SODIUM CHLORIDE (OCEAN) 0.65 % SOLN NASAL SPRAY    RINSE AS NEEDED FOR SINUS CONGESTION   SPACER/AERO-HOLDING CHAMBERS (AEROCHAMBER PLUS FLO-VU LARGE) MISC    1 each by Other route once.   TEMAZEPAM (RESTORIL) 30 MG CAPSULE    TAKE ONE CAPSULE BY MOUTH AT BEDTIME AS NEEDED FOR  SLEEP   TOUJEO SOLOSTAR 300 UNIT/ML SOPN    Inject 70 Units into the skin once.   ULORIC 40 MG TABLET    TAKE ONE TABLET BY MOUTH ONCE DAILY   WARFARIN (COUMADIN) 5 MG TABLET    Take 1 & 1/2  tablets daily except 1 tablet on Tuesdays, and Thursdays  Modified Medications   No medications on file  Discontinued Medications   No medications on file    Review of Systems  HENT: Negative for nosebleeds.   Respiratory: Negative for shortness of breath.   Cardiovascular: Negative for chest pain.  Gastrointestinal: Negative for anal bleeding and blood in stool.  Genitourinary: Positive for vaginal bleeding and vaginal discharge (brownish foul smelling). Negative for hematuria.  Hematological: Does not bruise/bleed easily.    Vitals:   06/23/16 1351  BP: 136/78  Pulse: 73    Temp: 98.4 F (36.9 C)  TempSrc: Oral  SpO2: 98%  Weight: (!) 339 lb 9.6 oz (154 kg)  Height: 5\' 7"  (1.702 m)   Body mass index is 53.19 kg/m.  Physical Exam  Constitutional: She appears well-developed and well-nourished.  HENT:  Mouth/Throat: Oropharynx is clear and moist. No oropharyngeal exudate.  Eyes: Lids are normal. Pupils are equal, round, and reactive to light. Right eye exhibits no discharge. Left eye exhibits no discharge. No scleral icterus.  Neck: Neck supple. Carotid bruit is not present. No tracheal deviation present. No thyromegaly present.  Cardiovascular: Normal rate, regular rhythm and intact distal pulses.  Exam reveals no gallop and no friction rub.   Murmur (1/6 SEM) heard. No LE edema b/l. no calf TTP.   Pulmonary/Chest: Effort normal. No stridor. No respiratory distress. She has no wheezes. She has no rales. She exhibits no tenderness.  Abdominal: Soft. Bowel sounds are normal. She exhibits no distension and no mass. There is no hepatomegaly. There is no tenderness. There is no rebound and no guarding.  obese  Musculoskeletal: She exhibits edema and tenderness.  Lymphadenopathy:    She has no cervical adenopathy.  Neurological: She is alert.  Skin: Skin is warm and dry. Bruising noted. No rash noted.  Psychiatric: [name] behavior is normal. She exhibits a depressed mood.     Labs reviewed: Office Visit on 05/22/2016  Component Date Value Ref Range Status  . INR 05/22/2016 2.2   Final  Abstract on 04/27/2016  Component Date Value Ref Range Status  . HM Diabetic Eye Exam 04/25/2016 No Retinopathy  No Retinopathy Final  Office Visit on 04/24/2016  Component Date Value Ref Range Status  . INR 04/24/2016 2.8   Final  . Hgb A1c MFr Bld 04/24/2016 8.6* <5.7 % Final   Comment:   For someone without known diabetes, a hemoglobin A1c value of 6.5% or greater indicates that they may have diabetes and this should be confirmed with a follow-up test.   For  someone with known diabetes, a value <7% indicates that their diabetes is well controlled and a value greater than or equal to 7% indicates suboptimal control. A1c targets should be individualized based on duration of diabetes, age, comorbid conditions, and other considerations.   Currently, no consensus exists for use of hemoglobin A1c for diagnosis of diabetes for children.     . Mean Plasma Glucose 04/24/2016 200  mg/dL Final  . Sodium 04/24/2016 139  135 - 146 mmol/L Final  . Potassium 04/24/2016 4.4  3.5 - 5.3 mmol/L Final  . Chloride 04/24/2016 105  98 - 110 mmol/L Final  . CO2 04/24/2016 24  20 - 31 mmol/L Final  . Glucose, Bld 04/24/2016 148* 65 - 99 mg/dL Final  . BUN 04/24/2016 28* 7 - 25 mg/dL Final  . Creat 04/24/2016 1.22* 0.60 - 0.93 mg/dL Final   Comment:   For patients > or = 80 years of age: The upper reference limit for Creatinine is approximately 13% higher for people  identified as African-American.     . Total Bilirubin 04/24/2016 0.3  0.2 - 1.2 mg/dL Final  . Alkaline Phosphatase 04/24/2016 73  33 - 130 U/L Final  . AST 04/24/2016 12  10 - 35 U/L Final  . ALT 04/24/2016 8  6 - 29 U/L Final  . Total Protein 04/24/2016 7.4  6.1 - 8.1 g/dL Final  . Albumin 04/24/2016 3.3* 3.6 - 5.1 g/dL Final  . Calcium 04/24/2016 8.6  8.6 - 10.4 mg/dL Final  . GFR, Est African American 04/24/2016 49* >=60 mL/min Final  . GFR, Est Non African American 04/24/2016 42* >=60 mL/min Final  Office Visit on 03/27/2016  Component Date Value Ref Range Status  . INR 03/27/2016 1.5   Final    No results found.   Assessment/Plan   ICD-9-CM ICD-10-CM   1. Endometrial polyp 621.0 N84.0 Ambulatory referral to Gynecology  2. Severe episode of recurrent major depressive disorder, without psychotic features ([place]) 296.33 F33.2 sertraline (ZOLOFT) 50 MG tablet  3. Vaginitis and vulvovaginitis 616.10 N76.0 Ambulatory referral to Gynecology  4. Chronic pain syndrome 338.4 G89.4   5.  Uncontrolled type 2 diabetes mellitus with hyperosmolarity without coma, with long-term current use of insulin (HCC) 250.22 E11.00 Microalbumin/Creatinine Ratio, Urine   V58.67 Z79.4 Urinalysis with Reflex Microscopic  6. Chronic diastolic congestive heart failure (HCC) 428.32 I50.32    428.0    7. Essential hypertension 401.9 I10   8. Mixed hyperlipidemia 272.2 E78.2 Lipid Panel  9. Other chronic pulmonary embolism without acute cor pulmonale (HCC) 416.2 I27.82    Increase sertraline 50mg  at bedtime  Continue other medications as ordered  Follow up with Tivis Ringer as scheduled  Will call with referal to GYN  Will call with lab result  Follow up in 3 mos for routine visit  Crystall Donaldson S. Perlie Gold  [facility] [address] [place], [place] [address] [phone] Cell (Monday-Friday 8 AM - 5 PM) [phone] After 5 PM and follow prompts

## 2016-06-26 ENCOUNTER — Ambulatory Visit: Payer: PPO | Admitting: Pharmacotherapy

## 2016-06-28 ENCOUNTER — Telehealth: Payer: Self-pay | Admitting: Internal Medicine

## 2016-06-28 NOTE — Telephone Encounter (Signed)
FYI,   Patient was referred to [facility] per referral for Endometrial Polyps & Vaginitis & Vulvovaginitis. When patient was contacted to schedule an appointment she declined an appointment and stated she would call back, she doesn't want to schedule an appointment at this time.

## 2016-07-03 ENCOUNTER — Ambulatory Visit (INDEPENDENT_AMBULATORY_CARE_PROVIDER_SITE_OTHER): Payer: PPO | Admitting: Pharmacotherapy

## 2016-07-03 ENCOUNTER — Other Ambulatory Visit: Payer: Self-pay

## 2016-07-03 ENCOUNTER — Encounter: Payer: Self-pay | Admitting: Pharmacotherapy

## 2016-07-03 VITALS — BP 134/80 | HR 65 | Ht 67.0 in | Wt 344.4 lb

## 2016-07-03 DIAGNOSIS — Z794 Long term (current) use of insulin: Secondary | ICD-10-CM | POA: Diagnosis not present

## 2016-07-03 DIAGNOSIS — I2782 Chronic pulmonary embolism: Secondary | ICD-10-CM | POA: Diagnosis not present

## 2016-07-03 DIAGNOSIS — E1100 Type 2 diabetes mellitus with hyperosmolarity without nonketotic hyperglycemic-hyperosmolar coma (NKHHC): Secondary | ICD-10-CM

## 2016-07-03 DIAGNOSIS — Z7901 Long term (current) use of anticoagulants: Secondary | ICD-10-CM | POA: Diagnosis not present

## 2016-07-03 DIAGNOSIS — N183 Chronic kidney disease, stage 3 unspecified: Secondary | ICD-10-CM

## 2016-07-03 LAB — URINALYSIS, MICROSCOPIC ONLY
Casts: NONE SEEN [LPF]
Crystals: NONE SEEN [HPF]
RBC / HPF: NONE SEEN RBC/HPF (ref <=2)
Squamous Epithelial / LPF: NONE SEEN [HPF] (ref <=5)
Yeast: NONE SEEN [HPF]

## 2016-07-03 LAB — POCT INR: INR: 3.1

## 2016-07-03 LAB — URINALYSIS, ROUTINE W REFLEX MICROSCOPIC
Bilirubin Urine: NEGATIVE
Glucose, UA: NEGATIVE
Hgb urine dipstick: NEGATIVE
Ketones, ur: NEGATIVE
Nitrite: NEGATIVE
Protein, ur: NEGATIVE
Specific Gravity, Urine: 1.009 (ref 1.001–1.035)
pH: 6.5 (ref 5.0–8.0)

## 2016-07-03 NOTE — Patient Instructions (Signed)
INR 3.1  Continue Coumadin 7.5mg  (1 & 1/2 tablet) daily except 5mg  (1 tablet) on Tuesdays and Thursdays

## 2016-07-03 NOTE — Progress Notes (Signed)
   Subjective:    Patient ID: [name], female    DOB: 1934/08/18, 82 y.o.   MRN: [id number]  HPI Last INR on 05/22/16 was OK at 2.2 Did have some blood in her urine this AM - no other times. Denies any injury Denies missed doses. Denies CP, SOB, falls Consistent with vitamin K    Review of Systems  HENT: Negative for nosebleeds.   Respiratory: Negative for shortness of breath.   Cardiovascular: Negative for chest pain.  Gastrointestinal: Negative for anal bleeding and blood in stool.  Genitourinary: Positive for hematuria. Negative for dysuria and flank pain.  Hematological: Does not bruise/bleed easily.       Objective:   Physical Exam  Constitutional: She is oriented to person, place, and time. She appears well-developed and well-nourished.  HENT:  Right Ear: External ear normal.  Left Ear: External ear normal.  Cardiovascular: Normal rate, regular rhythm and normal heart sounds.   Pulmonary/Chest: Effort normal and breath sounds normal.  Neurological: She is alert and oriented to person, place, and time.  Skin: Skin is warm and dry.  Psychiatric: She has a normal mood and affect. Her behavior is normal. Judgment and thought content normal.  Vitals reviewed.    BP: 134/80  HR: 65  Wt: 344.4lb INR 3.1     Assessment & Plan:  1.  INR slightly above goal 2-3. 2.  Continue Coumadin 7.5mg  QD except 5mg  T/Th 3.  INR 1 month

## 2016-07-04 ENCOUNTER — Other Ambulatory Visit: Payer: Self-pay

## 2016-07-04 LAB — MICROALBUMIN / CREATININE URINE RATIO
Creatinine, Urine: 63 mg/dL (ref 20–320)
Microalb Creat Ratio: 48 mcg/mg creat — ABNORMAL HIGH (ref <30)
Microalb, Ur: 3 mg/dL

## 2016-07-04 MED ORDER — LISINOPRIL 5 MG PO TABS: 5.0000 mg | ORAL_TABLET | Freq: Every day | ORAL | 4 refills | Status: DC

## 2016-07-04 NOTE — Telephone Encounter (Signed)
Lisinopril 5 mg was added to patient's medication list based on results of microalbumin.   Gildardo Cranker, DO  [facility]        (+) diabetic kidney disease - start low dose lisinopril 5mg  #30 take 1 tab po daily for kidney protection with 4 RF; repeat BMP in 1 month for CKD    Patient's medication list has been updated and prescription has been sent to pharmacy of choice.

## 2016-07-05 ENCOUNTER — Telehealth: Payer: Self-pay | Admitting: *Deleted

## 2016-07-05 NOTE — Telephone Encounter (Signed)
Medication lisinopril is to protect her kidneys from diabetes but is also used to reduce BP.

## 2016-07-05 NOTE — Telephone Encounter (Signed)
Patient daughter, [name] called and stated that she just went to the pharmacy and picked up 2 medications, Metoprolol and Lisinopril and they are both for blood pressure. Daughter stated that patient has not been taking the Lisinopril and doesn't know anything about her being on 2 different blood pressure medications because she does not have hypertension. Patient also in the background stating the same thing.  I tried to explain to her that Hypertension is one of patient's diagnosis and that the medication was in the patient's current medication list. Daughter stated that patient has not been taking it and wants it confirmed with Dr. Eulas Post. Please advise.

## 2016-07-06 NOTE — Telephone Encounter (Signed)
Discussed Dr.Carter's response with Ledon Snare verbalized understanding and will make sure patient takes Lisinopril

## 2016-07-14 ENCOUNTER — Other Ambulatory Visit: Payer: Self-pay | Admitting: *Deleted

## 2016-07-14 DIAGNOSIS — G894 Chronic pain syndrome: Secondary | ICD-10-CM

## 2016-07-14 MED ORDER — OXYCODONE-ACETAMINOPHEN 10-325 MG PO TABS: 1.0000 | ORAL_TABLET | Freq: Four times a day (QID) | ORAL | 0 refills | Status: DC | PRN

## 2016-07-14 NOTE — Telephone Encounter (Signed)
Patient called and requested Rx. Will pick up

## 2016-07-19 ENCOUNTER — Other Ambulatory Visit: Payer: Self-pay | Admitting: *Deleted

## 2016-07-19 MED ORDER — ONETOUCH VERIO VI STRP: ORAL_STRIP | 12 refills | Status: DC

## 2016-07-19 NOTE — Telephone Encounter (Signed)
Patient requested one touch verio

## 2016-07-23 ENCOUNTER — Emergency Department (HOSPITAL_COMMUNITY)
Admission: EM | Admit: 2016-07-23 | Discharge: 2016-07-23 | Disposition: A | Discharge status: Home / Self Care | Payer: PPO | Attending: Emergency Medicine | Admitting: Emergency Medicine

## 2016-07-23 ENCOUNTER — Encounter (HOSPITAL_COMMUNITY): Payer: Self-pay | Admitting: Emergency Medicine

## 2016-07-23 ENCOUNTER — Emergency Department (HOSPITAL_COMMUNITY): Payer: PPO

## 2016-07-23 DIAGNOSIS — R110 Nausea: Secondary | ICD-10-CM | POA: Diagnosis not present

## 2016-07-23 DIAGNOSIS — Z794 Long term (current) use of insulin: Secondary | ICD-10-CM | POA: Diagnosis not present

## 2016-07-23 DIAGNOSIS — I252 Old myocardial infarction: Secondary | ICD-10-CM | POA: Diagnosis not present

## 2016-07-23 DIAGNOSIS — I509 Heart failure, unspecified: Secondary | ICD-10-CM | POA: Insufficient documentation

## 2016-07-23 DIAGNOSIS — R0789 Other chest pain: Secondary | ICD-10-CM | POA: Diagnosis not present

## 2016-07-23 DIAGNOSIS — I130 Hypertensive heart and chronic kidney disease with heart failure and stage 1 through stage 4 chronic kidney disease, or unspecified chronic kidney disease: Secondary | ICD-10-CM | POA: Diagnosis not present

## 2016-07-23 DIAGNOSIS — N183 Chronic kidney disease, stage 3 (moderate): Secondary | ICD-10-CM | POA: Diagnosis not present

## 2016-07-23 DIAGNOSIS — I2510 Atherosclerotic heart disease of native coronary artery without angina pectoris: Secondary | ICD-10-CM | POA: Diagnosis not present

## 2016-07-23 DIAGNOSIS — R079 Chest pain, unspecified: Secondary | ICD-10-CM

## 2016-07-23 DIAGNOSIS — J449 Chronic obstructive pulmonary disease, unspecified: Secondary | ICD-10-CM | POA: Diagnosis not present

## 2016-07-23 DIAGNOSIS — E1122 Type 2 diabetes mellitus with diabetic chronic kidney disease: Secondary | ICD-10-CM | POA: Diagnosis not present

## 2016-07-23 DIAGNOSIS — R1110 Vomiting, unspecified: Secondary | ICD-10-CM | POA: Diagnosis not present

## 2016-07-23 DIAGNOSIS — R0600 Dyspnea, unspecified: Secondary | ICD-10-CM

## 2016-07-23 DIAGNOSIS — Z7901 Long term (current) use of anticoagulants: Secondary | ICD-10-CM | POA: Diagnosis not present

## 2016-07-23 DIAGNOSIS — R0602 Shortness of breath: Secondary | ICD-10-CM | POA: Diagnosis not present

## 2016-07-23 DIAGNOSIS — K219 Gastro-esophageal reflux disease without esophagitis: Secondary | ICD-10-CM | POA: Diagnosis not present

## 2016-07-23 LAB — BASIC METABOLIC PANEL
Anion gap: 8 (ref 5–15)
BUN: 21 mg/dL — ABNORMAL HIGH (ref 6–20)
CO2: 27 mmol/L (ref 22–32)
Calcium: 8.9 mg/dL (ref 8.9–10.3)
Chloride: 103 mmol/L (ref 101–111)
Creatinine, Ser: 1.44 mg/dL — ABNORMAL HIGH (ref 0.44–1.00)
GFR calc Af Amer: 39 mL/min — ABNORMAL LOW (ref >60)
GFR calc non Af Amer: 34 mL/min — ABNORMAL LOW (ref >60)
Glucose, Bld: 161 mg/dL — ABNORMAL HIGH (ref 65–99)
Potassium: 4.8 mmol/L (ref 3.5–5.1)
Sodium: 138 mmol/L (ref 135–145)

## 2016-07-23 LAB — I-STAT TROPONIN, ED
Troponin i, poc: 0 ng/mL (ref 0.00–0.08)
Troponin i, poc: 0 ng/mL (ref 0.00–0.08)

## 2016-07-23 LAB — CBC
HCT: 31.7 % — ABNORMAL LOW (ref 36.0–46.0)
Hemoglobin: 9.7 g/dL — ABNORMAL LOW (ref 12.0–15.0)
MCH: 26.4 pg (ref 26.0–34.0)
MCHC: 30.6 g/dL (ref 30.0–36.0)
MCV: 86.1 fL (ref 78.0–100.0)
Platelets: 317 10*3/uL (ref 150–400)
RBC: 3.68 MIL/uL — ABNORMAL LOW (ref 3.87–5.11)
RDW: 14.8 % (ref 11.5–15.5)
WBC: 11.8 10*3/uL — ABNORMAL HIGH (ref 4.0–10.5)

## 2016-07-23 LAB — PROTIME-INR
INR: 1.81
Prothrombin Time: 21.2 seconds — ABNORMAL HIGH (ref 11.4–15.2)

## 2016-07-23 LAB — D-DIMER, QUANTITATIVE (NOT AT ARMC): D-Dimer, Quant: 0.45 ug/mL-FEU (ref 0.00–0.50)

## 2016-07-23 MED ORDER — GI COCKTAIL ~~LOC~~
30.0000 mL | Freq: Once | ORAL | Status: AC
  Administered 2016-07-23: 30 mL via ORAL
  Filled 2016-07-23: qty 30

## 2016-07-23 MED ORDER — ONDANSETRON HCL 4 MG/2ML IJ SOLN
4.0000 mg | Freq: Once | INTRAMUSCULAR | Status: AC
  Administered 2016-07-23: 4 mg via INTRAVENOUS
  Filled 2016-07-23: qty 2

## 2016-07-23 MED ORDER — FENTANYL CITRATE (PF) 100 MCG/2ML IJ SOLN
50.0000 ug | Freq: Once | INTRAMUSCULAR | Status: AC
  Administered 2016-07-23: 50 ug via INTRAVENOUS
  Filled 2016-07-23: qty 2

## 2016-07-23 MED ORDER — SUCRALFATE 1 GM/10ML PO SUSP: 1.0000 g | Freq: Three times a day (TID) | ORAL | 0 refills | Status: DC

## 2016-07-23 MED ORDER — INSULIN ASPART 100 UNIT/ML ~~LOC~~ SOLN
SUBCUTANEOUS | Status: AC
  Filled 2016-07-23: qty 1

## 2016-07-23 NOTE — ED Triage Notes (Addendum)
Pt in from home via [facility] with c/o HA since yesterday, CP that began this morning "after eating yogurt, and feeling like it got stuck". Pt states pain is L sided and epigastric. Was given 2 NTG and 324 ASA, 4mg  zofran by EMS. Endorses nausea, still has headache from yesterday. Alert, elevated BP (180/100), CBG 120. Hx of MI in 04', CHF, DVT's.

## 2016-07-23 NOTE — Discharge Instructions (Signed)
The test for heart and lung problems, were reassuring today.  You can try using Robitussin-DM for cough.  We are prescribing Carafate to see if it helps your discomfort.  Since he had some vomiting today, start with a clear liquid diet this evening and then gradually advance to regular food over the next 1 or 2 days.  Follow up with your primary care doctor for a checkup in 1 week, sooner if needed.

## 2016-07-23 NOTE — ED Provider Notes (Signed)
[facility] DEPT Provider Note   CSN: [id number] Arrival date & time: 07/23/16  2409     History   Chief Complaint Chief Complaint  Patient presents with  . Chest Pain    HPI [name] is a 82 y.o. female.  Patient is here for evaluation of symptoms which started last night around 11 PM and were associated with trouble breathing, nausea, dizziness and headache.  Headache resolved, after she took 1 of her daughter's Xanax, and went to sleep.  The headache had resolved by the time she awoke this morning.  She tried to eat some yogurt with granola, but developed chest discomfort, and was "sick all over", so an ambulance was called.  She was treated with nitroglycerin and aspirin during transport and states that she feels even worse now.  She complains of a pressure-like sensation in her central chest.  No other recent illnesses.  There are no other known modifying factors.  HPI  Past Medical History:  Diagnosis Date  . Allergy    takes Mucinex daily as needed  . Anemia, unspecified   . Anxiety    takes Clonazepam daily as needed  . Arthritis   . CHF (congestive heart failure) (HCC)    takes Furosemide daily  . Coronary artery disease    a. s/p IMI 2004 tx with BMS to RCA;  b. s/p Promus DES to RCA 2/12 (cath: LM 20-30%, pLAD 20-30%, mLAD 50%, RI 30%, pRCA 60%, mRCA 90% - tx with PCI);  c. myoview 1/07: EF 49%, inf scar, no isch  . Coronary atherosclerosis of native coronary artery   . Depression    takes Cymbalta daily  . Diabetes mellitus    insulin daily  . Dysuria   . GERD (gastroesophageal reflux disease)    takes Protonix daily  . Gout, unspecified    takes Colchicine daily  . Headache    occasionally  . History of blood clots about 10yrs ago   in legs-takes Coumadin   . Hyperlipidemia    takes Pravastatin daily  . Hypertension    takes Imdur and Metoprolol daily  . Insomnia   . Joint pain   . Joint swelling   . Myocardial infarction 2004  . Numbness    . Obstructive sleep apnea   . Osteoarthritis   . Osteoarthritis   . Osteoarthrosis, unspecified whether generalized or localized, lower leg   . Pain, chronic   . Polymyalgia rheumatica ([place])   . Pulmonary emboli ([place]) 9/13   felt to need lifelong anticoagulation  . Shortness of breath dyspnea    with exertion and has Albuterol inhaler prn  . Type II or unspecified type diabetes mellitus without mention of complication, uncontrolled   . Urinary incontinence    takes Linzess daily  . Vertigo    hx of;was taking Meclizine if needed    Patient Active Problem List   Diagnosis Date Noted  . Severe episode of recurrent major depressive disorder, without psychotic features ([place]) 06/23/2016  . Endometrial polyp 06/23/2016  . Vaginitis and vulvovaginitis 06/23/2016  . Cough variant asthma 11/17/2015  . Vitamin D deficiency 08/02/2015  . Breast cancer ([place]) 04/26/2015  . Dyslipidemia associated with type 2 diabetes mellitus ([place]) 02/06/2015  . Uncontrolled type 2 diabetes mellitus with diabetic nephropathy, with long-term current use of insulin ([place]) 02/06/2015  . Chronic pain syndrome 02/06/2015  . Morbid obesity with BMI of 50.0-59.9, adult ([place]) 12/18/2014  . CKD (chronic kidney disease) stage 3, GFR 30-59  ml/min 12/18/2014  . Gout 10/27/2014  . Breast mass 10/27/2014  . Encounter for therapeutic drug monitoring 10/08/2014  . Vertigo 09/24/2014  . Chronic bronchitis ([place]) 09/23/2014  . Preoperative clearance 08/16/2014  . Abnormal stress test   . DOE (dyspnea on exertion) 06/24/2014  . CN (constipation) 05/10/2014  . History of pulmonary embolus (PE) 04/22/2014  . Sepsis secondary to UTI ([place]) 04/21/2014  . COPD (chronic obstructive pulmonary disease) ([place]) 04/21/2014  . Estrogen deficiency 02/19/2014  . Breast cancer screening 02/19/2014  . Weight gain 02/19/2014  . Pain in the chest 12/31/2013  . Type 2 diabetes mellitus with diabetic foot deformity ([place]) 07/29/2013  . OSA  (obstructive sleep apnea) 02/24/2013  . Need for prophylactic vaccination and inoculation against influenza 01/08/2013  . Insomnia 09/17/2012  . Debility 08/01/2012  . Osteoarthritis   . Dysphagia, unspecified(787.20) 07/31/2012  . GERD (gastroesophageal reflux disease) 07/31/2012  . Long term current use of anticoagulant therapy 02/02/2012  . Chronic diastolic congestive heart failure ([place]) 01/29/2012  . History of deep venous thrombosis 01/27/2012  . Abdominal pain 01/26/2012  . CHOLELITHIASIS 06/06/2010  . Situational depression 06/04/2009  . ORTHOPNEA 03/09/2009  . Morbid (severe) obesity due to excess calories ([place]) 02/15/2009  . ANEMIA 02/15/2009  . POLYMYALGIA RHEUMATICA 01/18/2009  . TENSION HEADACHE 01/07/2008  . Headache 01/07/2008  . FUNGAL DERMATITIS 11/13/2007  . CAD (coronary artery disease) 08/29/2007  . URINARY INCONTINENCE 08/29/2007  . Hyperlipidemia 12/03/2006  . Essential hypertension 12/03/2006  . DIVERTICULITIS, HX OF 12/03/2006    Past Surgical History:  Procedure Laterality Date  . ABDOMINAL HYSTERECTOMY     partial  . APPENDECTOMY    . blood clots/legs and lungs  2013  . BREAST LUMPECTOMY WITH RADIOACTIVE SEED LOCALIZATION Left 11/05/2014   Procedure: LEFT BREAST LUMPECTOMY WITH RADIOACTIVE SEED LOCALIZATION;  Surgeon: [name], MD;  Location: [facility];  Service: General;  Laterality: Left;  . CARDIAC CATHETERIZATION    . COLONOSCOPY    . CORONARY ANGIOPLASTY  2  . EXCISION OF SKIN TAG Right 11/05/2014   Procedure: EXCISION OF RIGHT EYELID SKIN TAG;  Surgeon: [name], MD;  Location: [facility];  Service: General;  Laterality: Right;  . EYE SURGERY Bilateral    cataract   . GASTRIC BYPASS  1977    reversed in 1979, [place] N/A 06/29/2014   Procedure: LEFT HEART CATHETERIZATION WITH CORONARY ANGIOGRAM;  Surgeon: Troy Sine, MD;  Location: [facility] CATH LAB;  Service: Cardiovascular;   Laterality: N/A;  . MI with stent placement  2004    OB History    No data available       Home Medications    Prior to Admission medications   Medication Sig Start Date End Date Taking? Authorizing Provider  albuterol (PROVENTIL HFA;VENTOLIN HFA) 108 (90 Base) MCG/ACT inhaler Inhale 1-2 puffs into the lungs every 6 (six) hours as needed for wheezing or shortness of breath. 03/15/16  Yes Robyn Haber, MD  anastrozole (ARIMIDEX) 1 MG tablet Take 1 tablet (1 mg total) by mouth daily. 07/30/15  Yes [name], MD  budesonide-formoterol [facility]) 160-4.5 MCG/ACT inhaler Inhale 2 puffs into the lungs 2 (two) times daily as needed (shortness of breath). 09/24/15  Yes Gildardo Cranker, DO  cetirizine (ZYRTEC) 10 MG tablet Take 10 mg by mouth at bedtime.   Yes Historical Provider, MD  clonazePAM (KLONOPIN) 1 MG tablet TAKE TWO TABLETS BY MOUTH AT BEDTIME AS NEEDED  FOR SLEEP 05/02/16  Yes [name], NP  docusate sodium (COLACE) 100 MG capsule Take 100 mg by mouth daily as needed for mild constipation. TAKE PER BOTTLE AS NEEDED FOR CONSTIPATION    Yes Historical Provider, MD  DULoxetine (CYMBALTA) 60 MG capsule TAKE ONE CAPSULE BY MOUTH ONCE DAILY FOR ANXIETY 06/08/16  Yes Gildardo Cranker, DO  insulin aspart (NOVOLOG) 100 UNIT/ML injection Inject 30 Units into the skin 3 (three) times daily with meals. 02/21/16  Yes Tivis Ringer, RPH-CPP  isosorbide mononitrate (IMDUR) 60 MG 24 hr tablet TAKE ONE TABLET BY MOUTH ONCE DAILY 06/05/16  Yes Gildardo Cranker, DO  Lancets [facility] ULTRASOFT) lancets Check blood sugar 2-3 times daily as directed 11/01/15  Yes Historical Provider, MD  lisinopril (PRINIVIL,ZESTRIL) 5 MG tablet Take 1 tablet (5 mg total) by mouth daily. 07/04/16  Yes Gildardo Cranker, DO  metoprolol succinate (TOPROL-XL) 25 MG 24 hr tablet TAKE ONE TABLET BY MOUTH ONCE DAILY FOR  BLOOD  PRESSURE 02/29/16  Yes Gildardo Cranker, DO  Multiple Vitamins-Minerals (HAIR/SKIN/NAILS/BIOTIN PO) Take 1  tablet by mouth every evening.   Yes Historical Provider, MD  nitroGLYCERIN (NITROSTAT) 0.4 MG SL tablet Take one tablet under the tongue every 5 minutes as needed for chest pain 09/24/13  Yes [name], NP  [facility] VERIO test strip Use to test blood sugar three times daily. Dx: E11.00 07/19/16  Yes Gildardo Cranker, DO  oxyCODONE-acetaminophen (PERCOCET) 10-325 MG tablet Take 1 tablet by mouth every 6 (six) hours as needed for pain. 07/14/16  Yes Monica Carter, DO  pantoprazole (PROTONIX) 40 MG tablet TAKE ONE TABLET BY MOUTH ONCE DAILY 05/02/16  Yes Gildardo Cranker, DO  pravastatin (PRAVACHOL) 20 MG tablet TAKE ONE TABLET BY MOUTH ONCE DAILY FOR  CHOLESTEROL 05/02/16  Yes Gildardo Cranker, DO  sertraline (ZOLOFT) 50 MG tablet Take 1 tablet (50 mg total) by mouth at bedtime. 06/23/16  Yes Gildardo Cranker, DO  sodium chloride (OCEAN) 0.65 % SOLN nasal spray RINSE AS NEEDED FOR SINUS CONGESTION   Yes Historical Provider, MD  Spacer/Aero-Holding Chambers (AEROCHAMBER PLUS FLO-VU LARGE) MISC 1 each by Other route once. 11/04/15  Yes Lysbeth Penner, FNP  temazepam (RESTORIL) 30 MG capsule TAKE ONE CAPSULE BY MOUTH AT BEDTIME AS NEEDED FOR  SLEEP 05/02/16  Yes [name], NP  TOUJEO SOLOSTAR 300 UNIT/ML SOPN Inject 70 Units into the skin once. 01/24/16 07/23/16 Yes Monica Carter, DO  ULORIC 40 MG tablet TAKE ONE TABLET BY MOUTH ONCE DAILY 03/23/16  Yes Gildardo Cranker, DO  warfarin (COUMADIN) 5 MG tablet Take 1 & 1/2  tablets daily except 1 tablet on Tuesdays, and Thursdays Patient taking differently: Take 7.5 mg by mouth See admin instructions. 7.5mg  once daily except Tuesdays and Thursdays 01/24/16  Yes Cathey Sabra Heck, RPH-CPP  sucralfate (CARAFATE) 1 GM/10ML suspension Take 10 mLs (1 g total) by mouth 4 (four) times daily -  with meals and at bedtime. 07/23/16   Daleen Bo, MD    Family History Family History  Problem Relation Age of Onset  . Breast cancer Mother 72  . Heart disease Mother   .  Throat cancer Father   . Hypertension Father   . Arthritis Father   . Diabetes Father   . Arthritis Sister   . Obesity Sister   . Diabetes Sister   . Heart disease Cousin     Social History Social History  Substance Use Topics  . Smoking status: Never Smoker  . Smokeless tobacco:  Never Used  . Alcohol use No     Allergies   Sulfonamide derivatives and Tramadol   Review of Systems Review of Systems  All other systems reviewed and are negative.    Physical Exam Updated Vital Signs BP 130/87 (BP Location: Right Arm)   Pulse 79   Temp 97.6 F (36.4 C) (Oral)   Resp 18   Ht 5\' 7"  (1.702 m)   Wt (!) 341 lb (154.7 kg)   SpO2 99%   BMI 53.41 kg/m   Physical Exam  Constitutional: She is oriented to person, place, and time. She appears well-developed. No distress (She appears ill, and weak).  Elderly, morbidly obese  HENT:  Head: Normocephalic and atraumatic.  Eyes: Conjunctivae and EOM are normal. Pupils are equal, round, and reactive to light.  Neck: Normal range of motion and phonation normal. Neck supple.  Cardiovascular: Normal rate and regular rhythm.   Pulmonary/Chest: Effort normal and breath sounds normal. She exhibits no tenderness (Mid anterior tenderness, mild).  Abdominal: Soft. She exhibits no distension. There is no tenderness. There is no guarding.  Musculoskeletal: Normal range of motion.  Neurological: She is alert and oriented to person, place, and time. She exhibits normal muscle tone.  Skin: Skin is warm and dry.  Psychiatric: She has a normal mood and affect. Her behavior is normal.  Nursing note and vitals reviewed.    ED Treatments / Results  Labs (all labs ordered are listed, but only abnormal results are displayed) Labs Reviewed  CBC - Abnormal; Notable for the following:       Result Value   WBC 11.8 (*)    RBC 3.68 (*)    Hemoglobin 9.7 (*)    HCT 31.7 (*)    All other components within normal limits  BASIC METABOLIC PANEL -  Abnormal; Notable for the following:    Glucose, Bld 161 (*)    BUN 21 (*)    Creatinine, Ser 1.44 (*)    GFR calc non Af Amer 34 (*)    GFR calc Af Amer 39 (*)    All other components within normal limits  PROTIME-INR - Abnormal; Notable for the following:    Prothrombin Time 21.2 (*)    All other components within normal limits  D-DIMER, QUANTITATIVE (NOT AT [facility])  I-STAT TROPOININ, ED  I-STAT TROPOININ, ED    EKG  EKG Interpretation  Date/Time:  Sunday July 23 2016 12:38:31 EDT Ventricular Rate:  76 PR Interval:    QRS Duration: 90 QT Interval:  423 QTC Calculation: 476 R Axis:   -17 Text Interpretation:  Sinus rhythm Consider left atrial enlargement Borderline left axis deviation ST elevation, consider inferior injury Since last tracing of earlier today No significant change was found Confirmed by Eulis Foster  MD, Avannah Decker [phone]) on 07/23/2016 1:28:28 PM       Radiology Dg Chest Port 1 View  Result Date: 07/23/2016 CLINICAL DATA:  Vomiting since this morning.  Headache. EXAM: PORTABLE CHEST 1 VIEW COMPARISON:  11/05/2015 and 06/13/2015 FINDINGS: Lungs are adequately inflated without consolidation or effusion. Cardiomediastinal silhouette and remainder of the exam is unchanged. IMPRESSION: No active disease. Electronically Signed   By: Marin Olp M.D.   On: 07/23/2016 12:52    Procedures Procedures (including critical care time)  Medications Ordered in ED Medications  insulin aspart (novoLOG) 100 UNIT/ML injection (not administered)  gi cocktail (Maalox,Lidocaine,Donnatal) (30 mLs Oral Given 07/23/16 1136)  fentaNYL (SUBLIMAZE) injection 50 mcg (50 mcg Intravenous Given 07/23/16  1221)  ondansetron (ZOFRAN) injection 4 mg (4 mg Intravenous Given 07/23/16 1221)     Initial Impression / Assessment and Plan / ED Course  I have reviewed the triage vital signs and the nursing notes.  Pertinent labs & imaging results that were available during my care of the patient were  reviewed by me and considered in my medical decision making (see chart for details).  Clinical Course as of Jul 23 1545  Sun Jul 23, 2016  1214 Patient states that the GI cocktail did not help.  She is complaining of worsening chest discomfort.  Workup noted so far, is nondiagnostic.  No evidence for PE, ACS or pneumonia.  These findings were discussed with the patient  [EW]  1228 The patient has now vomited and "feel much better".  [EW]    Clinical Course User Index [EW] Daleen Bo, MD    Medications  insulin aspart (novoLOG) 100 UNIT/ML injection (not administered)  gi cocktail (Maalox,Lidocaine,Donnatal) (30 mLs Oral Given 07/23/16 1136)  fentaNYL (SUBLIMAZE) injection 50 mcg (50 mcg Intravenous Given 07/23/16 1221)  ondansetron (ZOFRAN) injection 4 mg (4 mg Intravenous Given 07/23/16 1221)    Patient Vitals for the past 24 hrs:  BP Temp Temp src Pulse Resp SpO2 Height Weight  07/23/16 1510 130/87 97.6 F (36.4 C) Oral 79 18 99 % - -  07/23/16 1500 135/74 - - 77 20 98 % - -  07/23/16 1430 128/60 - - 71 18 99 % - -  07/23/16 1400 - - - 68 17 97 % - -  07/23/16 1345 - - - 70 16 97 % - -  07/23/16 1330 135/69 - - 66 15 100 % - -  07/23/16 1315 (!) 129/57 - - 73 (!) 23 99 % - -  07/23/16 1300 116/78 - - 68 18 100 % - -  07/23/16 1200 (!) 148/72 - - 70 18 98 % - -  07/23/16 1145 (!) 168/89 - - 78 14 97 % - -  07/23/16 1130 (!) 147/95 - - 78 (!) 23 97 % - -  07/23/16 1115 137/68 - - 73 12 94 % - -  07/23/16 1045 (!) 143/69 - - 73 18 99 % - -  07/23/16 1030 129/67 - - 70 14 96 % - -  07/23/16 1017 - - - - - - 5\' 7"  (1.702 m) (!) 341 lb (154.7 kg)  07/23/16 1015 124/69 - - 73 15 96 % - -  07/23/16 1010 136/77 98.2 F (36.8 C) - 76 16 97 % - -  07/23/16 1009 - - - - - 95 % - -    3:45 PM Reevaluation with update and discussion. After initial assessment and treatment, an updated evaluation reveals the patient had dramatic improvement of her discomfort after a single episode of  vomiting.  After that she was able to drink some fluids easily.  Findings and plan were discussed with patient and daughter, all questions were answered. Azaiah Licciardi L    Final Clinical Impressions(s) / ED Diagnoses   Final diagnoses:  Nonspecific chest pain  Dyspnea, unspecified type  Gastroesophageal reflux disease, esophagitis presence not specified    Patient with nonspecific symptoms of chest pain and trouble breathing.  She is improved after symptomatic treatment in the emergency department.  I suspect the primary problem is gastroesophageal reflux, with nonspecific cough, possibly occult aspiration.  The patient is hemodynamically stable.  I doubt serious bacterial infection, metabolic instability or impending vascular collapse.  Nursing Notes Reviewed/ Care Coordinated Applicable Imaging Reviewed Interpretation of Laboratory Data incorporated into ED treatment  The patient appears reasonably screened and/or stabilized for discharge and I doubt any other medical condition or other [facility] requiring further screening, evaluation, or treatment in the ED at this time prior to discharge.  Plan: Home Medications-continue usual medications; Home Treatments-rest, fluids; return here if the recommended treatment, does not improve the symptoms; Recommended follow up-PCP checkup 1 week and as needed   New Prescriptions Discharge Medication List as of 07/23/2016  3:05 PM    START taking these medications   Details  sucralfate (CARAFATE) 1 GM/10ML suspension Take 10 mLs (1 g total) by mouth 4 (four) times daily -  with meals and at bedtime., Starting Sun 07/23/2016, Print         Daleen Bo, MD 07/23/16 [phone]

## 2016-07-23 NOTE — ED Notes (Signed)
Pt given ice chips, per Ubaldo Glassing, Therapist, sports.

## 2016-07-27 ENCOUNTER — Ambulatory Visit (INDEPENDENT_AMBULATORY_CARE_PROVIDER_SITE_OTHER): Payer: PPO | Admitting: Nurse Practitioner

## 2016-07-27 ENCOUNTER — Other Ambulatory Visit: Payer: Self-pay

## 2016-07-27 ENCOUNTER — Encounter: Payer: Self-pay | Admitting: Nurse Practitioner

## 2016-07-27 VITALS — BP 128/82 | HR 84 | Temp 97.4°F | Resp 17 | Ht 67.0 in | Wt 338.4 lb

## 2016-07-27 DIAGNOSIS — K2100 Gastro-esophageal reflux disease with esophagitis, without bleeding: Secondary | ICD-10-CM

## 2016-07-27 DIAGNOSIS — R42 Dizziness and giddiness: Secondary | ICD-10-CM

## 2016-07-27 DIAGNOSIS — R413 Other amnesia: Secondary | ICD-10-CM | POA: Diagnosis not present

## 2016-07-27 DIAGNOSIS — G894 Chronic pain syndrome: Secondary | ICD-10-CM | POA: Diagnosis not present

## 2016-07-27 LAB — COMPLETE METABOLIC PANEL WITH GFR
ALT: 9 U/L (ref 6–29)
AST: 14 U/L (ref 10–35)
Albumin: 3.3 g/dL — ABNORMAL LOW (ref 3.6–5.1)
Alkaline Phosphatase: 84 U/L (ref 33–130)
BUN: 39 mg/dL — ABNORMAL HIGH (ref 7–25)
CO2: 26 mmol/L (ref 20–31)
Calcium: 8.9 mg/dL (ref 8.6–10.4)
Chloride: 102 mmol/L (ref 98–110)
Creat: 1.4 mg/dL — ABNORMAL HIGH (ref 0.60–0.93)
GFR, Est African American: 41 mL/min — ABNORMAL LOW (ref >=60)
GFR, Est Non African American: 36 mL/min — ABNORMAL LOW (ref >=60)
Glucose, Bld: 176 mg/dL — ABNORMAL HIGH (ref 65–99)
Potassium: 5.5 mmol/L — ABNORMAL HIGH (ref 3.5–5.3)
Sodium: 136 mmol/L (ref 135–146)
Total Bilirubin: 0.3 mg/dL (ref 0.2–1.2)
Total Protein: 7.5 g/dL (ref 6.1–8.1)

## 2016-07-27 LAB — CBC WITH DIFFERENTIAL/PLATELET
Basophils Absolute: 0 cells/uL (ref 0–200)
Basophils Relative: 0 %
Eosinophils Absolute: 146 cells/uL (ref 15–500)
Eosinophils Relative: 1 %
HCT: 32.4 % — ABNORMAL LOW (ref 35.0–45.0)
Hemoglobin: 9.9 g/dL — ABNORMAL LOW (ref 11.7–15.5)
Lymphocytes Relative: 21 %
Lymphs Abs: 3066 cells/uL (ref 850–3900)
MCH: 25.8 pg — ABNORMAL LOW (ref 27.0–33.0)
MCHC: 30.6 g/dL — ABNORMAL LOW (ref 32.0–36.0)
MCV: 84.6 fL (ref 80.0–100.0)
MPV: 10.1 fL (ref 7.5–12.5)
Monocytes Absolute: 730 cells/uL (ref 200–950)
Monocytes Relative: 5 %
Neutro Abs: 10658 cells/uL — ABNORMAL HIGH (ref 1500–7800)
Neutrophils Relative %: 73 %
Platelets: 361 10*3/uL (ref 140–400)
RBC: 3.83 MIL/uL (ref 3.80–5.10)
RDW: 15.5 % — ABNORMAL HIGH (ref 11.0–15.0)
WBC: 14.6 10*3/uL — ABNORMAL HIGH (ref 3.8–10.8)

## 2016-07-27 NOTE — Progress Notes (Signed)
Careteam: Patient Care Team: Gildardo Cranker, DO as PCP - General (Internal Medicine) Renella Cunas, MD (Inactive) as Consulting Physician (Cardiology) [name], MD as Consulting Physician (Ophthalmology) [name], MD as Consulting Physician (General Surgery) [name], MD as Referring Physician (Obstetrics and Gynecology)   Allergies  Allergen Reactions  . Sulfonamide Derivatives Swelling    Mouth swelling  . Tramadol Nausea And Vomiting    Chief Complaint  Patient presents with  . Acute Visit    Pain with swallowing and drinking liquids, wants referral to specialist     HPI: Patient is a 82 y.o. female seen in the office today to follow up ED visit from 07/23/16. She complaints of pain with swallowing solid foods for the last week. She states that she feels that the food is getting stuck in her chest. She has a hx of GERD in which she is to be taking Protonix for her symptoms. She admits to epigastric pain with eating, along with nausea, regurgitation, and SOB. She has not been consistently taking her Protonix and was seen in the ED on 3/18 for similar symptoms. She has also been taking peppermint and carafate (prescribed in the ED) for her nausea and epigastric pain with good relief. She has a history of chest pain and CHF. Troponin levels in the ED were negative. Prior to ED visit, she took 2 SL NTG tabs with no relief. She does not have CP today. Denies blood in stool or urine.    She also is having complaints of  headaches, blurry vision, tinnitus with hearing loss, and memory changes, and dizziness which have progressively gotten worse since her ED visit in November. Reports dizziness when she moves her head.   Pt had a fall back in November of last year in which she was seen in the ED with subsequent CT scan of the head. CT was negative for any acute changes including diffuse low density periventricular white matter stabilization. Her symptoms have been worsening  since November 2017. Her baseline memory and functional status is moderate at best. MMSE completed today with a score of 25/30.  MMSE - Mini Mental State Exam 07/27/2016 03/24/2016 06/04/2013  Orientation to time 4 5 4   Orientation to Place 5 5 5   Registration 3 3 3   Attention/ Calculation 5 0 4  Recall 1 2 2   Language- name 2 objects 2 2 2   Language- repeat 1 1 1   Language- follow 3 step command 1 3 3   Language- read & follow direction 1 1 1   Write a sentence 1 1 1   Copy design 1 1 0  Total score 25 24 26    Review of Systems:  Review of Systems  Constitutional: Negative for fatigue.  HENT: Positive for hearing loss and tinnitus. Negative for facial swelling, sinus pain and sinus pressure.   Eyes: Positive for photophobia and visual disturbance.  Respiratory: Positive for shortness of breath. Negative for cough, chest tightness, wheezing and stridor.   Cardiovascular: Positive for leg swelling. Negative for chest pain and palpitations.  Gastrointestinal: Positive for abdominal pain, constipation and nausea. Negative for abdominal distention, diarrhea and vomiting.  Genitourinary: Negative for dyspareunia and dysuria.  Musculoskeletal: Negative for back pain.  Skin: Negative for color change and rash.  Neurological: Positive for dizziness, weakness, light-headedness and headaches. Negative for syncope and numbness.  Psychiatric/Behavioral: Negative for behavioral problems.    Past Medical History:  Diagnosis Date  . Allergy    takes Mucinex daily as  needed  . Anemia, unspecified   . Anxiety    takes Clonazepam daily as needed  . Arthritis   . CHF (congestive heart failure) (HCC)    takes Furosemide daily  . Coronary artery disease    a. s/p IMI 2004 tx with BMS to RCA;  b. s/p Promus DES to RCA 2/12 (cath: LM 20-30%, pLAD 20-30%, mLAD 50%, RI 30%, pRCA 60%, mRCA 90% - tx with PCI);  c. myoview 1/07: EF 49%, inf scar, no isch  . Coronary atherosclerosis of native coronary artery    . Depression    takes Cymbalta daily  . Diabetes mellitus    insulin daily  . Dysuria   . GERD (gastroesophageal reflux disease)    takes Protonix daily  . Gout, unspecified    takes Colchicine daily  . Headache    occasionally  . History of blood clots about 27yrs ago   in legs-takes Coumadin   . Hyperlipidemia    takes Pravastatin daily  . Hypertension    takes Imdur and Metoprolol daily  . Insomnia   . Joint pain   . Joint swelling   . Myocardial infarction 2004  . Numbness   . Obstructive sleep apnea   . Osteoarthritis   . Osteoarthritis   . Osteoarthrosis, unspecified whether generalized or localized, lower leg   . Pain, chronic   . Polymyalgia rheumatica ([place])   . Pulmonary emboli ([place]) 9/13   felt to need lifelong anticoagulation  . Shortness of breath dyspnea    with exertion and has Albuterol inhaler prn  . Type II or unspecified type diabetes mellitus without mention of complication, uncontrolled   . Urinary incontinence    takes Linzess daily  . Vertigo    hx of;was taking Meclizine if needed   Past Surgical History:  Procedure Laterality Date  . ABDOMINAL HYSTERECTOMY     partial  . APPENDECTOMY    . blood clots/legs and lungs  2013  . BREAST LUMPECTOMY WITH RADIOACTIVE SEED LOCALIZATION Left 11/05/2014   Procedure: LEFT BREAST LUMPECTOMY WITH RADIOACTIVE SEED LOCALIZATION;  Surgeon: [name], MD;  Location: [place];  Service: General;  Laterality: Left;  . CARDIAC CATHETERIZATION    . COLONOSCOPY    . CORONARY ANGIOPLASTY  2  . EXCISION OF SKIN TAG Right 11/05/2014   Procedure: EXCISION OF RIGHT EYELID SKIN TAG;  Surgeon: [name], MD;  Location: [place];  Service: General;  Laterality: Right;  . EYE SURGERY Bilateral    cataract   . GASTRIC BYPASS  1977    reversed in 1979, [place] N/A 06/29/2014   Procedure: LEFT HEART CATHETERIZATION WITH CORONARY ANGIOGRAM;  Surgeon: Troy Sine, MD;  Location: [facility] CATH LAB;  Service: Cardiovascular;  Laterality: N/A;  . MI with stent placement  2004   Social History:   reports that she has never smoked. She has never used smokeless tobacco. She reports that she does not drink alcohol or use drugs.  Family History  Problem Relation Age of Onset  . Breast cancer Mother 80  . Heart disease Mother   . Throat cancer Father   . Hypertension Father   . Arthritis Father   . Diabetes Father   . Arthritis Sister   . Obesity Sister   . Diabetes Sister   . Heart disease Cousin     Medications: Patient's Medications  New Prescriptions   No medications on file  Previous  Medications   ALBUTEROL (PROVENTIL HFA;VENTOLIN HFA) 108 (90 BASE) MCG/ACT INHALER    Inhale 1-2 puffs into the lungs every 6 (six) hours as needed for wheezing or shortness of breath.   ANASTROZOLE (ARIMIDEX) 1 MG TABLET    Take 1 tablet (1 mg total) by mouth daily.   BUDESONIDE-FORMOTEROL (SYMBICORT) 160-4.5 MCG/ACT INHALER    Inhale 2 puffs into the lungs 2 (two) times daily as needed (shortness of breath).   CETIRIZINE (ZYRTEC) 10 MG TABLET    Take 10 mg by mouth at bedtime.   CLONAZEPAM (KLONOPIN) 1 MG TABLET    TAKE TWO TABLETS BY MOUTH AT BEDTIME AS NEEDED FOR SLEEP   DOCUSATE SODIUM (COLACE) 100 MG CAPSULE    Take 100 mg by mouth daily as needed for mild constipation. TAKE PER BOTTLE AS NEEDED FOR CONSTIPATION    DULOXETINE (CYMBALTA) 60 MG CAPSULE    TAKE ONE CAPSULE BY MOUTH ONCE DAILY FOR ANXIETY   INSULIN ASPART (NOVOLOG) 100 UNIT/ML INJECTION    Inject 30 Units into the skin 3 (three) times daily with meals.   ISOSORBIDE MONONITRATE (IMDUR) 60 MG 24 HR TABLET    TAKE ONE TABLET BY MOUTH ONCE DAILY   LANCETS (ONETOUCH ULTRASOFT) LANCETS    Check blood sugar 2-3 times daily as directed   LISINOPRIL (PRINIVIL,ZESTRIL) 5 MG TABLET    Take 1 tablet (5 mg total) by mouth daily.   METOPROLOL SUCCINATE (TOPROL-XL) 25 MG 24 HR TABLET    TAKE ONE TABLET BY  MOUTH ONCE DAILY FOR  BLOOD  PRESSURE   MULTIPLE VITAMINS-MINERALS (HAIR/SKIN/NAILS/BIOTIN PO)    Take 1 tablet by mouth every evening.   NITROGLYCERIN (NITROSTAT) 0.4 MG SL TABLET    Take one tablet under the tongue every 5 minutes as needed for chest pain   ONETOUCH VERIO TEST STRIP    Use to test blood sugar three times daily. Dx: E11.00   OXYCODONE-ACETAMINOPHEN (PERCOCET) 10-325 MG TABLET    Take 1 tablet by mouth every 6 (six) hours as needed for pain.   PANTOPRAZOLE (PROTONIX) 40 MG TABLET    TAKE ONE TABLET BY MOUTH ONCE DAILY   PRAVASTATIN (PRAVACHOL) 20 MG TABLET    TAKE ONE TABLET BY MOUTH ONCE DAILY FOR  CHOLESTEROL   SERTRALINE (ZOLOFT) 50 MG TABLET    Take 1 tablet (50 mg total) by mouth at bedtime.   SODIUM CHLORIDE (OCEAN) 0.65 % SOLN NASAL SPRAY    RINSE AS NEEDED FOR SINUS CONGESTION   SPACER/AERO-HOLDING CHAMBERS (AEROCHAMBER PLUS FLO-VU LARGE) MISC    1 each by Other route once.   SUCRALFATE (CARAFATE) 1 GM/10ML SUSPENSION    Take 10 mLs (1 g total) by mouth 4 (four) times daily -  with meals and at bedtime.   TEMAZEPAM (RESTORIL) 30 MG CAPSULE    TAKE ONE CAPSULE BY MOUTH AT BEDTIME AS NEEDED FOR  SLEEP   TOUJEO SOLOSTAR 300 UNIT/ML SOPN    Inject 70 Units into the skin once.   ULORIC 40 MG TABLET    TAKE ONE TABLET BY MOUTH ONCE DAILY   WARFARIN (COUMADIN) 5 MG TABLET    Take 1 & 1/2  tablets daily except 1 tablet on Tuesdays, and Thursdays  Modified Medications   No medications on file  Discontinued Medications   No medications on file     Physical Exam:  Vitals:   07/27/16 0847  BP: 128/82  Pulse: 84  Resp: 17  Temp: 97.4 F (36.3 C)  TempSrc: Oral  SpO2: 98%  Weight: (!) 338 lb 6.4 oz (153.5 kg)  Height: 5\' 7"  (1.702 m)   Body mass index is 53 kg/m.  Physical Exam  Constitutional: She is oriented to person, place, and time. She appears well-developed and well-nourished. No distress.  HENT:  Head: Normocephalic and atraumatic.  Right Ear: External  ear normal.  Left Ear: External ear normal.  Mouth/Throat: Oropharynx is clear and moist.  Eyes: Conjunctivae and EOM are normal. Pupils are equal, round, and reactive to light. Right eye exhibits no discharge. Left eye exhibits no discharge. Right eye exhibits no nystagmus. Left eye exhibits no nystagmus.  Neck: Normal range of motion. Neck supple. No JVD present.  Cardiovascular: Normal rate, regular rhythm, normal heart sounds and intact distal pulses.   No murmur heard. Pulses:      Radial pulses are 2+ on the right side, and 2+ on the left side.       Dorsalis pedis pulses are 1+ on the right side, and 1+ on the left side.  Pulmonary/Chest: Effort normal and breath sounds normal. No respiratory distress. She has no wheezes.  Abdominal: Soft. Bowel sounds are decreased. There is tenderness in the epigastric area. There is no guarding and negative Murphy's sign.  Lymphadenopathy:    She has no cervical adenopathy.  Neurological: She is alert and oriented to person, place, and time. Gait abnormal. GCS eye subscore is 4. GCS verbal subscore is 5. GCS motor subscore is 6.  Skin: She is not diaphoretic.  Psychiatric: Her speech is normal and behavior is normal. Thought content normal.    Labs reviewed: Basic Metabolic Panel:  Recent Labs  03/20/16 1140 04/24/16 1247 07/23/16 1023  NA 138 139 138  K 4.2 4.4 4.8  CL 102 105 103  CO2 28 24 27   GLUCOSE 212* 148* 161*  BUN 25* 28* 21*  CREATININE 1.44* 1.22* 1.44*  CALCIUM 8.8* 8.6 8.9   Liver Function Tests:  Recent Labs  07/30/15 1046 12/13/15 1333 04/24/16 1247  AST 17 20 12   ALT 17 16 8   ALKPHOS 79 78 73  BILITOT 0.60 0.55 0.3  PROT 7.9 7.8 7.4  ALBUMIN 2.9* 2.8* 3.3*   No results for input(s): LIPASE, AMYLASE in the last 8760 hours. No results for input(s): AMMONIA in the last 8760 hours. CBC:  Recent Labs  12/08/15 1025 12/13/15 1333 03/15/16 1719 03/20/16 1140 07/23/16 1023  WBC 10.9* 11.9*  --  10.0  11.8*  NEUTROABS 7.3 8.3*  --  6.8  --   HGB 9.8* 10.0* 10.9* 10.0* 9.7*  HCT 30.3* 32.5* 32.0* 33.1* 31.7*  MCV 82.8 86.2  --  87.6 86.1  PLT 318.0 272  --  344 317   Lipid Panel:  Recent Labs  06/23/16 1455  CHOL 102  HDL 36*  LDLCALC 47  TRIG 95  CHOLHDL 2.8   TSH: No results for input(s): TSH in the last 8760 hours. A1C: Lab Results  Component Value Date   HGBA1C 8.6 (H) 04/24/2016     Assessment/Plan  1. Memory loss -MMSE completed today with a score of 25/30 which has improved from previous of 24/30. Pt very inconsistent with history and reports she just does not remember anything and would like referral to neurology.  -Will refer to Neurology due to her progressive symptoms since November -CT scan completed in November 2018 showed stable white matter changes suggestive for chronic small vessel ischemic changes. -will also get drug screen due to  chronic use of pain medication with memory changes to rule out other non-prescribed substances which could be contributing to memory loss.   2. Vertigo -Will refer to Neurology for further evaluation, concerned about possible meniere's disease as well due to hearing loss with tinnitus and vertigo.  -Educated on the importance of stabilizing herself prior to ambulation  3. GERD with esophagitis -Continue carafate four times per day for gastric pain -Finish current bottle of medication, if better, then stop the medication at that time. If needed we can send refill and will refer to GI  -Continue to take the Protonix daily for GERD symptoms -Minimize foods and fluids which precipitate the symptoms -If not better with the current regimen, will make a referral to GI for possible evaluation of PUD, H.pylori, or esophagitis.     Carlos American. Harle Battiest  [facility] [phone] 8 am - 5 pm) [phone] (after hours)

## 2016-07-27 NOTE — Patient Instructions (Addendum)
We will refer you to neurology at this time due to worsening memory loss and dizziness  If the abdominal pain gets worse let us know and we will refer you to gastroenterology.  Complete Carafate and let us know if acid reflux returns and you need refill.   Will get labs today

## 2016-07-28 ENCOUNTER — Ambulatory Visit
Admission: RE | Admit: 2016-07-28 | Discharge: 2016-07-28 | Disposition: A | Discharge status: Home / Self Care | Payer: PPO | Source: Ambulatory Visit | Attending: Nurse Practitioner | Admitting: Nurse Practitioner

## 2016-07-28 ENCOUNTER — Other Ambulatory Visit: Payer: Self-pay | Admitting: Nurse Practitioner

## 2016-07-28 DIAGNOSIS — D72829 Elevated white blood cell count, unspecified: Secondary | ICD-10-CM | POA: Diagnosis not present

## 2016-07-31 ENCOUNTER — Ambulatory Visit: Payer: PPO | Admitting: Pharmacotherapy

## 2016-08-01 ENCOUNTER — Other Ambulatory Visit: Payer: Self-pay

## 2016-08-01 ENCOUNTER — Other Ambulatory Visit: Payer: PPO

## 2016-08-01 DIAGNOSIS — N390 Urinary tract infection, site not specified: Secondary | ICD-10-CM | POA: Diagnosis not present

## 2016-08-01 DIAGNOSIS — D72829 Elevated white blood cell count, unspecified: Secondary | ICD-10-CM | POA: Diagnosis not present

## 2016-08-02 ENCOUNTER — Ambulatory Visit (INDEPENDENT_AMBULATORY_CARE_PROVIDER_SITE_OTHER): Payer: PPO | Admitting: Neurology

## 2016-08-02 ENCOUNTER — Other Ambulatory Visit (INDEPENDENT_AMBULATORY_CARE_PROVIDER_SITE_OTHER): Payer: PPO

## 2016-08-02 ENCOUNTER — Telehealth: Payer: Self-pay | Admitting: *Deleted

## 2016-08-02 ENCOUNTER — Encounter: Payer: Self-pay | Admitting: Neurology

## 2016-08-02 VITALS — BP 128/64 | HR 82 | Ht 67.0 in | Wt 338.0 lb

## 2016-08-02 DIAGNOSIS — IMO0002 Reserved for concepts with insufficient information to code with codable children: Secondary | ICD-10-CM

## 2016-08-02 DIAGNOSIS — E1121 Type 2 diabetes mellitus with diabetic nephropathy: Secondary | ICD-10-CM | POA: Diagnosis not present

## 2016-08-02 DIAGNOSIS — E785 Hyperlipidemia, unspecified: Secondary | ICD-10-CM

## 2016-08-02 DIAGNOSIS — I10 Essential (primary) hypertension: Secondary | ICD-10-CM | POA: Diagnosis not present

## 2016-08-02 DIAGNOSIS — R413 Other amnesia: Secondary | ICD-10-CM | POA: Diagnosis not present

## 2016-08-02 DIAGNOSIS — F329 Major depressive disorder, single episode, unspecified: Secondary | ICD-10-CM

## 2016-08-02 DIAGNOSIS — R42 Dizziness and giddiness: Secondary | ICD-10-CM

## 2016-08-02 DIAGNOSIS — F32A Depression, unspecified: Secondary | ICD-10-CM

## 2016-08-02 DIAGNOSIS — I679 Cerebrovascular disease, unspecified: Secondary | ICD-10-CM | POA: Diagnosis not present

## 2016-08-02 DIAGNOSIS — Z794 Long term (current) use of insulin: Secondary | ICD-10-CM | POA: Diagnosis not present

## 2016-08-02 DIAGNOSIS — E1165 Type 2 diabetes mellitus with hyperglycemia: Secondary | ICD-10-CM

## 2016-08-02 LAB — TSH: TSH: 1.52 u[IU]/mL (ref 0.35–4.50)

## 2016-08-02 LAB — VITAMIN B12: Vitamin B-12: 337 pg/mL (ref 211–911)

## 2016-08-02 NOTE — Telephone Encounter (Signed)
Patient called and left message on clinical intake line and stated that she needed a refill on a medication she received at the hospital. She stated that [name] told her that if she needed it refilled to call the office.   I called back and LMOM for patient to return call with name of medication and what pharmacy she wanted it called to.

## 2016-08-02 NOTE — Progress Notes (Signed)
NEUROLOGY CONSULTATION NOTE  KATTLEYA KUHNERT MRN: [id number] DOB: 11/15/1934  Referring provider: [name], NP Primary care provider: Gildardo Cranker, DO  Reason for consult:  Vertigo, memory loss  HISTORY OF PRESENT ILLNESS: [name] is a 82 year old female with CHF, CAD, depression, diabetes, arthritis, OSA, anxiety and history of DVT and PE on chronic anticoagulation who presents with memory deficits and vertigo.  She is accompanied by her daughter who supplements history.  Vertigo: This is a chronic issue.  When I ask her about dizziness, she describes it as feeling off-balance when she stands.  She reports numbness and burning in her feet.  She denies spinning sensation or lightheadedness.  However, she also reports history of vertigo but cannot elaborate on how often or when they occur.  Headaches: For several months, she reports brief episodes of headaches described as holocephalic sharp pain, severe intensity, lasting 3 to 5 minutes.  There are no associated symptoms such as nausea, photophobia, phonophobia or visual disturbance.  They would occur once a week, however she hasn't had a spell in about 2 weeks.  Memory deficits: She reports gradually worsening memory deficits over the past year.  Usually, they are short-term deficits in which she forgets conversations and frequently repeat questions.  She finds it difficult to retain information and will often forget after reading a passage from the bible or hearing the minister's sermon.    She reports severe depression and cries.  She does take sertraline 50mg  daily.  She denies family history of dementia.  She lives with her daughter and has many health problems that limits her quality of life.  She is mostly wheelchair-bound.  She reports tinnitus and hearing loss.  CT of head from 03/20/16 showed diffuse chronic small vessel ischemic changes.  MRI of brain from 09/24/14 showed atrophy and diffuse chronic small vessel  disease as well.  Labs:  07/27/16:  CBC with WBC 14.6, HGB 9.9, HCT 32.4, PLT 15.5.  She is being checked for UTI.     CMP with Na 136, K 5.5, Cl 102, CO2 26, glucose 176, BUN 39, Cr 1.40, total bili 0.3, ALP 84, AST 14, ALT 9. 04/24/16: Hgb A1c 8.6  PAST MEDICAL HISTORY: Past Medical History:  Diagnosis Date  . Allergy    takes Mucinex daily as needed  . Anemia, unspecified   . Anxiety    takes Clonazepam daily as needed  . Arthritis   . CHF (congestive heart failure) (HCC)    takes Furosemide daily  . Coronary artery disease    a. s/p IMI 2004 tx with BMS to RCA;  b. s/p Promus DES to RCA 2/12 (cath: LM 20-30%, pLAD 20-30%, mLAD 50%, RI 30%, pRCA 60%, mRCA 90% - tx with PCI);  c. myoview 1/07: EF 49%, inf scar, no isch  . Coronary atherosclerosis of native coronary artery   . Depression    takes Cymbalta daily  . Diabetes mellitus    insulin daily  . Dysuria   . GERD (gastroesophageal reflux disease)    takes Protonix daily  . Gout, unspecified    takes Colchicine daily  . Headache    occasionally  . History of blood clots about 6yrs ago   in legs-takes Coumadin   . Hyperlipidemia    takes Pravastatin daily  . Hypertension    takes Imdur and Metoprolol daily  . Insomnia   . Joint pain   . Joint swelling   . Myocardial infarction 2004  .  Numbness   . Obstructive sleep apnea   . Osteoarthritis   . Osteoarthritis   . Osteoarthrosis, unspecified whether generalized or localized, lower leg   . Pain, chronic   . Polymyalgia rheumatica ([place])   . Pulmonary emboli ([place]) 9/13   felt to need lifelong anticoagulation  . Shortness of breath dyspnea    with exertion and has Albuterol inhaler prn  . Type II or unspecified type diabetes mellitus without mention of complication, uncontrolled   . Urinary incontinence    takes Linzess daily  . Vertigo    hx of;was taking Meclizine if needed    PAST SURGICAL HISTORY: Past Surgical History:  Procedure Laterality Date  .  ABDOMINAL HYSTERECTOMY     partial  . APPENDECTOMY    . blood clots/legs and lungs  2013  . BREAST LUMPECTOMY WITH RADIOACTIVE SEED LOCALIZATION Left 11/05/2014   Procedure: LEFT BREAST LUMPECTOMY WITH RADIOACTIVE SEED LOCALIZATION;  Surgeon: [name], MD;  Location: [facility];  Service: General;  Laterality: Left;  . CARDIAC CATHETERIZATION    . COLONOSCOPY    . CORONARY ANGIOPLASTY  2  . EXCISION OF SKIN TAG Right 11/05/2014   Procedure: EXCISION OF RIGHT EYELID SKIN TAG;  Surgeon: [name], MD;  Location: [facility];  Service: General;  Laterality: Right;  . EYE SURGERY Bilateral    cataract   . GASTRIC BYPASS  1977    reversed in 1979, [place] N/A 06/29/2014   Procedure: LEFT HEART CATHETERIZATION WITH CORONARY ANGIOGRAM;  Surgeon: Troy Sine, MD;  Location: [facility] CATH LAB;  Service: Cardiovascular;  Laterality: N/A;  . MI with stent placement  2004    MEDICATIONS: Current Outpatient Prescriptions on File Prior to Visit  Medication Sig Dispense Refill  . albuterol (PROVENTIL HFA;VENTOLIN HFA) 108 (90 Base) MCG/ACT inhaler Inhale 1-2 puffs into the lungs every 6 (six) hours as needed for wheezing or shortness of breath. 1 Inhaler 6  . anastrozole (ARIMIDEX) 1 MG tablet Take 1 tablet (1 mg total) by mouth daily. 90 tablet 4  . budesonide-formoterol (SYMBICORT) 160-4.5 MCG/ACT inhaler Inhale 2 puffs into the lungs 2 (two) times daily as needed (shortness of breath). 1 Inhaler 6  . cetirizine (ZYRTEC) 10 MG tablet Take 10 mg by mouth at bedtime.    . clonazePAM (KLONOPIN) 1 MG tablet TAKE TWO TABLETS BY MOUTH AT BEDTIME AS NEEDED FOR SLEEP 180 tablet 0  . docusate sodium (COLACE) 100 MG capsule Take 100 mg by mouth daily as needed for mild constipation. TAKE PER BOTTLE AS NEEDED FOR CONSTIPATION     . DULoxetine (CYMBALTA) 60 MG capsule TAKE ONE CAPSULE BY MOUTH ONCE DAILY FOR ANXIETY 90 capsule 1  . insulin aspart  (NOVOLOG) 100 UNIT/ML injection Inject 30 Units into the skin 3 (three) times daily with meals. 10 vial 6  . Insulin Glargine (TOUJEO SOLOSTAR) 300 UNIT/ML SOPN Inject 70 Units into the skin daily.    . isosorbide mononitrate (IMDUR) 60 MG 24 hr tablet TAKE ONE TABLET BY MOUTH ONCE DAILY 90 tablet 1  . Lancets (ONETOUCH ULTRASOFT) lancets Check blood sugar 2-3 times daily as directed    . lisinopril (PRINIVIL,ZESTRIL) 5 MG tablet Take 1 tablet (5 mg total) by mouth daily. 30 tablet 4  . metoprolol succinate (TOPROL-XL) 25 MG 24 hr tablet TAKE ONE TABLET BY MOUTH ONCE DAILY FOR  BLOOD  PRESSURE 90 tablet 1  . Multiple Vitamins-Minerals (HAIR/SKIN/NAILS/BIOTIN PO) Take 1  tablet by mouth every evening.    . nitroGLYCERIN (NITROSTAT) 0.4 MG SL tablet Take one tablet under the tongue every 5 minutes as needed for chest pain 50 tablet 0  . ONETOUCH VERIO test strip Use to test blood sugar three times daily. Dx: E11.00 100 each 12  . oxyCODONE-acetaminophen (PERCOCET) 10-325 MG tablet Take 1 tablet by mouth every 6 (six) hours as needed for pain. 120 tablet 0  . pantoprazole (PROTONIX) 40 MG tablet TAKE ONE TABLET BY MOUTH ONCE DAILY 90 tablet 1  . pravastatin (PRAVACHOL) 20 MG tablet TAKE ONE TABLET BY MOUTH ONCE DAILY FOR  CHOLESTEROL 30 tablet 1  . sertraline (ZOLOFT) 50 MG tablet Take 1 tablet (50 mg total) by mouth at bedtime. 30 tablet 6  . sodium chloride (OCEAN) 0.65 % SOLN nasal spray RINSE AS NEEDED FOR SINUS CONGESTION    . Spacer/Aero-Holding Chambers (AEROCHAMBER PLUS FLO-VU LARGE) MISC 1 each by Other route once. 1 each 0  . sucralfate (CARAFATE) 1 GM/10ML suspension Take 10 mLs (1 g total) by mouth 4 (four) times daily -  with meals and at bedtime. 420 mL 0  . temazepam (RESTORIL) 30 MG capsule TAKE ONE CAPSULE BY MOUTH AT BEDTIME AS NEEDED FOR  SLEEP 90 capsule 0  . ULORIC 40 MG tablet TAKE ONE TABLET BY MOUTH ONCE DAILY 30 tablet 5  . warfarin (COUMADIN) 5 MG tablet Take 1 & 1/2  tablets  daily except 1 tablet on Tuesdays, and Thursdays 45 tablet 5   No current facility-administered medications on file prior to visit.     ALLERGIES: Allergies  Allergen Reactions  . Sulfonamide Derivatives Swelling    Mouth swelling  . Tramadol Nausea And Vomiting    FAMILY HISTORY: Family History  Problem Relation Age of Onset  . Breast cancer Mother 6  . Heart disease Mother   . Throat cancer Father   . Hypertension Father   . Arthritis Father   . Diabetes Father   . Arthritis Sister   . Obesity Sister   . Diabetes Sister   . Heart disease Cousin     SOCIAL HISTORY: Social History   Social History  . Marital status: Widowed    Spouse name: N/A  . Number of children: N/A  . Years of education: N/A   Occupational History  . retired    Social History Main Topics  . Smoking status: Never Smoker  . Smokeless tobacco: Never Used  . Alcohol use No  . Drug use: No  . Sexual activity: Not Currently   Other Topics Concern  . Not on file   Social History Narrative  . No narrative on file    REVIEW OF SYSTEMS: Constitutional: No fevers, chills, or sweats, no generalized fatigue, change in appetite Eyes: No visual changes, double vision, eye pain Ear, nose and throat: No hearing loss, ear pain, nasal congestion, sore throat Cardiovascular: No chest pain, palpitations Respiratory:  No shortness of breath at rest or with exertion, wheezes GastrointestinaI: No nausea, vomiting, diarrhea, abdominal pain, fecal incontinence Genitourinary:  No dysuria, urinary retention or frequency Musculoskeletal:  No neck pain, back pain Integumentary: No rash, pruritus, skin lesions Neurological: as above Psychiatric: No depression, insomnia, anxiety Endocrine: No palpitations, fatigue, diaphoresis, mood swings, change in appetite, change in weight, increased thirst Hematologic/Lymphatic:  No purpura, petechiae. Allergic/Immunologic: no itchy/runny eyes, nasal congestion, recent  allergic reactions, rashes  PHYSICAL EXAM: Vitals:   08/02/16 1440  BP: 128/64  Pulse: 82  General: No acute distress.  Patient appears well-groomed.  Morbidly obese Head:  Normocephalic/atraumatic Eyes:  fundi examined but not visualized Neck: supple, no paraspinal tenderness, full range of motion Back: No paraspinal tenderness Heart: regular rate and rhythm Lungs: Clear to auscultation bilaterally. Vascular: No carotid bruits. Neurological Exam: Mental status: alert and oriented to person, place, and time, recent and remote memory intact, fund of knowledge intact, attention and concentration intact, speech fluent and not dysarthric, language intact. Cranial nerves: CN I: not tested CN II: pupils equal, round and reactive to light, visual fields intact CN III, IV, VI:  full range of motion, no nystagmus, no ptosis CN V: facial sensation intact CN VII: upper and lower face symmetric CN VIII: hearing intact CN IX, X: gag intact, uvula midline CN XI: sternocleidomastoid and trapezius muscles intact CN XII: tongue midline Bulk & Tone: normal, no fasciculations. Motor:  4-/4 in hip flexion bilaterally, otherwise 5/5 throughout  Sensation:  Temperature sensation intact.  Decreased vibration sensation in feet.. Deep Tendon Reflexes:  1+ throughout except absent in ankles, toes downgoing. Finger to nose testing:  Without dysmetria.  Heel to shin:  Unable to assess Gait:  In wheelchair  IMPRESSION: 1.  Memory deficits:  She does not have dementia.  Mild cognitive impairment due to cerebrovascular disease is possible, but memory deficits may also be a symptom of her depression. Will also check B12 and TSH. 2.  Vertigo.  Likely positional.  Frequency is unclear but does not seem to be often 3.  Probable diabetic neuropathy, causing gait instability 4.  Headache, possibly tension type.  Infrequent 5.  Cerebrovascular disease with CAD, hyperlipidemia, diabetes mellitus and morbid  obesity.  PLAN: 1.  I would like to get neuropsychological testing to sort out etiology of memory deficits:  Vascular, pre-cursor to Alzheimer's or secondary to depression.  Etiology will help determine treatment 2.  For headache, we can try gabapentin.  However, I would rather not start a new medication to add to her polypharmacy, especially since the headaches are brief and infrequent. 3.  Optimize stroke risk factors:  Blood pressure, glycemic control, hyperlipidemia, weight loss. 4.  Follow up after testing.  Thank you for allowing me to take part in the care of this patient.  Metta Clines, DO  CC:  [name]

## 2016-08-02 NOTE — Patient Instructions (Signed)
1.  I would like to set you up for neurocognitive testing to try and determine the cause of your memory problems.  It may all be due to depression. 2.  Follow up afterwards.

## 2016-08-03 ENCOUNTER — Other Ambulatory Visit: Payer: Self-pay

## 2016-08-03 DIAGNOSIS — K219 Gastro-esophageal reflux disease without esophagitis: Secondary | ICD-10-CM

## 2016-08-03 LAB — DRUG TOX MONITOR 1 W/CONF, ORAL FLD
Alprazolam: 0.72 ng/mL — ABNORMAL HIGH (ref <0.50)
Amphetamines: NEGATIVE ng/mL (ref <10)
Barbiturates: NEGATIVE ng/mL (ref <10)
Benzodiazepines: POSITIVE ng/mL — AB (ref <0.50)
Buprenorphine: NEGATIVE ng/mL (ref <0.025)
Chlordiazepoxide: NEGATIVE ng/mL (ref <0.50)
Clonazepam: NEGATIVE ng/mL (ref <0.50)
Cocaine: NEGATIVE ng/mL (ref <2.5)
Codeine: NEGATIVE ng/mL (ref <2.5)
Diazepam: NEGATIVE ng/mL (ref <0.50)
Dihydrocodeine: NEGATIVE ng/mL (ref <2.5)
Fentanyl: NEGATIVE ng/mL (ref <0.10)
Flunitrazepam: NEGATIVE ng/mL (ref <0.50)
Flurazepam: NEGATIVE ng/mL (ref <0.50)
Heroin Metabolite: NEGATIVE ng/mL (ref <1.0)
Hydrocodone: NEGATIVE ng/mL (ref <2.5)
Hydromorphone: NEGATIVE ng/mL (ref <2.5)
Lorazepam: NEGATIVE ng/mL (ref <0.50)
MDMA: NEGATIVE ng/mL (ref <10)
Marijuana: NEGATIVE ng/mL (ref <2.5)
Meperidine: NEGATIVE ng/mL (ref <5.0)
Meprobamate: NEGATIVE ng/mL (ref <2.5)
Methadone: NEGATIVE ng/mL (ref <5.0)
Midazolam: NEGATIVE ng/mL (ref <0.50)
Morphine: NEGATIVE ng/mL (ref <2.5)
Nicotine Metabolite: NEGATIVE ng/mL (ref <5.0)
Nordiazepam: NEGATIVE ng/mL (ref <0.50)
Norhydrocodone: NEGATIVE ng/mL (ref <2.5)
Noroxycodone: 13.2 ng/mL — ABNORMAL HIGH (ref <2.5)
Opiates: POSITIVE ng/mL — AB (ref <2.5)
Oxazepam: NEGATIVE ng/mL (ref <0.50)
Oxycodone: 198.8 ng/mL — ABNORMAL HIGH (ref <2.5)
Oxymorphone: NEGATIVE ng/mL (ref <2.5)
Phencyclidine: NEGATIVE ng/mL (ref <10)
Propoxyphene: NEGATIVE ng/mL (ref <5.0)
Tapentadol: NEGATIVE ng/mL (ref <5.0)
Temazepam: NEGATIVE ng/mL (ref <0.50)
Tramadol: NEGATIVE ng/mL (ref <5.0)
Triazolam: NEGATIVE ng/mL (ref <0.50)
Zolpidem: NEGATIVE ng/mL (ref <5.0)

## 2016-08-03 LAB — URINE CULTURE

## 2016-08-03 MED ORDER — SUCRALFATE 1 GM/10ML PO SUSP: 1.0000 g | Freq: Three times a day (TID) | ORAL | 0 refills | Status: DC

## 2016-08-03 MED ORDER — CIPROFLOXACIN HCL 500 MG PO TABS: 500.0000 mg | ORAL_TABLET | Freq: Two times a day (BID) | ORAL | 0 refills | Status: DC

## 2016-08-03 NOTE — Telephone Encounter (Signed)
Patient's daughter called, patient needs a rx for Carafate and vertigo medication. Patient would like rx's sent to [facility] in [place]   Please advise  Please call patient once complete

## 2016-08-03 NOTE — Telephone Encounter (Addendum)
Patient is not currently taking medication for vertigo, patient had a diagnosis of vertigo and would like medication, please advise   RX for Carafate sent

## 2016-08-03 NOTE — Telephone Encounter (Signed)
Also needs GI referral, please see result notes

## 2016-08-03 NOTE — Telephone Encounter (Signed)
GI referral was placed and Rx for cipro was sent to pharmacy. Patient did not have any questions at this time.

## 2016-08-03 NOTE — Telephone Encounter (Signed)
Okay to refill Carafate, will also need gastroenterology referral if still having bad GERD Please clarify "vertigo" medication

## 2016-08-07 ENCOUNTER — Telehealth: Payer: Self-pay

## 2016-08-07 DIAGNOSIS — R42 Dizziness and giddiness: Secondary | ICD-10-CM

## 2016-08-07 NOTE — Telephone Encounter (Signed)
Patient took antibiotic x 4 days and discontinued. Patient c/o dizziness(feels drunk) , stomach upset, and headache. Patient would like to know how you would like for her to proceeded.   Patient asymptomatic, no urinary tract infection symptoms. Please advise

## 2016-08-07 NOTE — Telephone Encounter (Signed)
Spoke with patient she sated that she can not continue taking antibiotic it makes her feel terrible and she is not taking anybody else medication. She expressed that she do not even know what Xanax is used for. FYI

## 2016-08-07 NOTE — Telephone Encounter (Signed)
These symptoms could be related to UTI, would like her to complete antibiotics and see if symptoms persist. Also urine drug screen resulted that he was taking alprazolam and this is not on her medication list. She should not be taking medication not prescribed to her. This could be making her feel dizzy/drunk. We have refer her to GI due to abdominal pain

## 2016-08-07 NOTE — Telephone Encounter (Signed)
-----   Message from [name], NP sent at 08/03/2016 12:44 PM EDT ----- Please call [name] and let her know she has UTI, pt to start Cipro 500 mg by mouth twice daily #14/0 refills to pharmary of choice

## 2016-08-07 NOTE — Telephone Encounter (Signed)
Lets have her come back to follow up CBC with diff, if this is still elevated she will need another OV

## 2016-08-08 ENCOUNTER — Telehealth: Payer: Self-pay

## 2016-08-08 ENCOUNTER — Encounter: Payer: Self-pay | Admitting: Internal Medicine

## 2016-08-08 NOTE — Telephone Encounter (Signed)
Called patient. Gave lab results and med instrutions. Patient verbalized understanding.

## 2016-08-08 NOTE — Telephone Encounter (Signed)
Tried calling patient. Voicemail Box is not set up, cannot LMOM. Will try again

## 2016-08-08 NOTE — Telephone Encounter (Signed)
Patient notified. Stated that she was out of town till Friday. Lab appointment and order placed for Friday.

## 2016-08-08 NOTE — Telephone Encounter (Signed)
-----   Message from [name], DO sent at 08/08/2016  9:58 AM EDT ----- B12 is in normal range but slightly on the low-end.  Recommend starting 500 to 600 IU daily

## 2016-08-11 ENCOUNTER — Telehealth: Payer: Self-pay

## 2016-08-11 ENCOUNTER — Other Ambulatory Visit: Payer: Self-pay | Admitting: Hematology

## 2016-08-11 ENCOUNTER — Other Ambulatory Visit: Payer: Self-pay

## 2016-08-11 ENCOUNTER — Other Ambulatory Visit: Payer: PPO

## 2016-08-11 DIAGNOSIS — R42 Dizziness and giddiness: Secondary | ICD-10-CM | POA: Diagnosis not present

## 2016-08-11 DIAGNOSIS — G894 Chronic pain syndrome: Secondary | ICD-10-CM

## 2016-08-11 DIAGNOSIS — N6320 Unspecified lump in the left breast, unspecified quadrant: Secondary | ICD-10-CM

## 2016-08-11 LAB — CBC WITH DIFFERENTIAL/PLATELET
Basophils Absolute: 0 cells/uL (ref 0–200)
Basophils Relative: 0 %
Eosinophils Absolute: 270 cells/uL (ref 15–500)
Eosinophils Relative: 2 %
HCT: 29.7 % — ABNORMAL LOW (ref 35.0–45.0)
Hemoglobin: 9.3 g/dL — ABNORMAL LOW (ref 11.7–15.5)
Lymphocytes Relative: 26 %
Lymphs Abs: 3510 cells/uL (ref 850–3900)
MCH: 26.4 pg — ABNORMAL LOW (ref 27.0–33.0)
MCHC: 31.3 g/dL — ABNORMAL LOW (ref 32.0–36.0)
MCV: 84.4 fL (ref 80.0–100.0)
MPV: 10.5 fL (ref 7.5–12.5)
Monocytes Absolute: 810 cells/uL (ref 200–950)
Monocytes Relative: 6 %
Neutro Abs: 8910 cells/uL — ABNORMAL HIGH (ref 1500–7800)
Neutrophils Relative %: 66 %
Platelets: 356 10*3/uL (ref 140–400)
RBC: 3.52 MIL/uL — ABNORMAL LOW (ref 3.80–5.10)
RDW: 15.3 % — ABNORMAL HIGH (ref 11.0–15.0)
WBC: 13.5 10*3/uL — ABNORMAL HIGH (ref 3.8–10.8)

## 2016-08-11 MED ORDER — MECLIZINE HCL 25 MG PO TABS: 25.0000 mg | ORAL_TABLET | Freq: Three times a day (TID) | ORAL | 0 refills | Status: DC | PRN

## 2016-08-11 MED ORDER — OXYCODONE-ACETAMINOPHEN 10-325 MG PO TABS: 1.0000 | ORAL_TABLET | Freq: Four times a day (QID) | ORAL | 0 refills | Status: DC | PRN

## 2016-08-11 NOTE — Telephone Encounter (Signed)
Patient was in office for labs and asked for medication for dizziness.   Per Dr.Reed ok to give rx for meclizine 25 mg TID PRN #60, no refills.  Patient aware rx sent to pharmacy

## 2016-08-12 ENCOUNTER — Other Ambulatory Visit: Payer: Self-pay | Admitting: Internal Medicine

## 2016-08-14 ENCOUNTER — Ambulatory Visit (INDEPENDENT_AMBULATORY_CARE_PROVIDER_SITE_OTHER): Payer: PPO | Admitting: Pharmacotherapy

## 2016-08-14 ENCOUNTER — Other Ambulatory Visit: Payer: Self-pay | Admitting: Nurse Practitioner

## 2016-08-14 ENCOUNTER — Encounter: Payer: Self-pay | Admitting: Pharmacotherapy

## 2016-08-14 ENCOUNTER — Other Ambulatory Visit: Payer: Self-pay | Admitting: Hematology

## 2016-08-14 VITALS — BP 130/82 | HR 69 | Temp 97.6°F | Ht 67.0 in | Wt 341.0 lb

## 2016-08-14 DIAGNOSIS — Z853 Personal history of malignant neoplasm of breast: Secondary | ICD-10-CM

## 2016-08-14 DIAGNOSIS — Z7901 Long term (current) use of anticoagulants: Secondary | ICD-10-CM

## 2016-08-14 DIAGNOSIS — I2782 Chronic pulmonary embolism: Secondary | ICD-10-CM | POA: Diagnosis not present

## 2016-08-14 LAB — POCT INR: INR: 2.8

## 2016-08-14 MED ORDER — AMOXICILLIN-POT CLAVULANATE 875-125 MG PO TABS: 1.0000 | ORAL_TABLET | Freq: Two times a day (BID) | ORAL | 0 refills | Status: DC

## 2016-08-14 NOTE — Patient Instructions (Signed)
INR 2.8  Continue Coumadin 7.5mg  (1 & 1/2 tablet) daily except 5mg  (1 tablet) on Tuesdays and Thursdays

## 2016-08-14 NOTE — Progress Notes (Signed)
   Subjective:    Patient ID: [name], female    DOB: 03/12/1935, 82 y.o.   MRN: [id number]  HPI Last INR on 07/23/16 was low at 1.8 Current coumadin dose is 7.5mg  QD except 5mg  T/Th Denies missed doses Denies unusual bleeding or bruising Denies CP, SOB, falls Consistent with vitamin K intake.   Review of Systems  HENT: Negative for nosebleeds.   Respiratory: Negative for shortness of breath.   Cardiovascular: Negative for chest pain.  Gastrointestinal: Negative for anal bleeding and blood in stool.  Genitourinary: Negative for hematuria.  Hematological: Does not bruise/bleed easily.       Objective:   Physical Exam  Constitutional: She is oriented to person, place, and time. She appears well-developed and well-nourished.  HENT:  Right Ear: External ear normal.  Left Ear: External ear normal.  Cardiovascular: Normal rate, regular rhythm and normal heart sounds.   Pulmonary/Chest: Effort normal and breath sounds normal.  Neurological: She is alert and oriented to person, place, and time.  Skin: Skin is warm and dry.  Psychiatric: She has a normal mood and affect. Her behavior is normal. Judgment and thought content normal.  Vitals reviewed.   BP: 130/82  HR: 69  Wt: 341lb INR 2.8      Assessment & Plan:  1.  INR at goal 2-3 2. Continue Coumadin 7.5mg  QD except 5mg  T/Th 3.  RTC 1 month

## 2016-08-16 ENCOUNTER — Ambulatory Visit
Admission: RE | Admit: 2016-08-16 | Discharge: 2016-08-16 | Disposition: A | Discharge status: Home / Self Care | Payer: PPO | Source: Ambulatory Visit | Attending: Hematology | Admitting: Hematology

## 2016-08-16 ENCOUNTER — Telehealth: Payer: Self-pay | Admitting: Hematology

## 2016-08-16 ENCOUNTER — Telehealth: Payer: Self-pay | Admitting: *Deleted

## 2016-08-16 DIAGNOSIS — Z853 Personal history of malignant neoplasm of breast: Secondary | ICD-10-CM

## 2016-08-16 DIAGNOSIS — R928 Other abnormal and inconclusive findings on diagnostic imaging of breast: Secondary | ICD-10-CM | POA: Diagnosis not present

## 2016-08-16 NOTE — Telephone Encounter (Signed)
Nichole Billow, NP called Clinical Intake line questioning Drug Screen done on patient. Patient had Alprazolam come back positive in drug screen and she is not taking Alprazolam. [name] wants [name], lab, to call and check if Clonazepam or Temazepam are a byproduct of the Alprazolam and can make drug screen Positive for Alprazolam by taking those medications.   Printed this message and given to [name] to check with lab per [name].

## 2016-08-16 NOTE — Telephone Encounter (Signed)
Thank you :)

## 2016-08-16 NOTE — Telephone Encounter (Signed)
Scheduled appt per sch message from Rn loren. Called patient and left message with appt date and time.

## 2016-08-16 NOTE — Telephone Encounter (Signed)
[name] with our lab called and spoke with a Chief Technology Officer and they told [name] that those medications should not cause a Positive.   [facility] is going to have [place] with [facility] call you tomorrow to further explain.

## 2016-08-17 ENCOUNTER — Telehealth: Payer: Self-pay

## 2016-08-17 NOTE — Telephone Encounter (Signed)
I called patient to confirm that she requested therapeutic shoes and inserts from [facility]. Patient did request, form forwarded to covering provider (Dr.Carter out of office)   Placed in Dr.Green's review and sign folder with last OV note from [place]

## 2016-08-23 ENCOUNTER — Other Ambulatory Visit: Payer: PPO

## 2016-08-23 ENCOUNTER — Ambulatory Visit: Payer: PPO | Admitting: Hematology

## 2016-08-24 ENCOUNTER — Telehealth: Payer: Self-pay | Admitting: Hematology

## 2016-08-24 NOTE — Telephone Encounter (Signed)
Called to inform patient of r/s . Daughter is aware of appt date and time.

## 2016-08-30 ENCOUNTER — Other Ambulatory Visit: Payer: Self-pay | Admitting: *Deleted

## 2016-08-30 ENCOUNTER — Other Ambulatory Visit: Payer: Self-pay | Admitting: Internal Medicine

## 2016-08-30 ENCOUNTER — Telehealth: Payer: Self-pay | Admitting: *Deleted

## 2016-08-30 DIAGNOSIS — E1165 Type 2 diabetes mellitus with hyperglycemia: Principal | ICD-10-CM

## 2016-08-30 DIAGNOSIS — E1121 Type 2 diabetes mellitus with diabetic nephropathy: Secondary | ICD-10-CM

## 2016-08-30 DIAGNOSIS — IMO0002 Reserved for concepts with insufficient information to code with codable children: Secondary | ICD-10-CM

## 2016-08-30 DIAGNOSIS — Z794 Long term (current) use of insulin: Principal | ICD-10-CM

## 2016-08-30 MED ORDER — INSULIN GLARGINE 300 UNIT/ML ~~LOC~~ SOPN: 70.0000 [IU] | PEN_INJECTOR | Freq: Two times a day (BID) | SUBCUTANEOUS | 3 refills | Status: DC

## 2016-08-30 NOTE — Telephone Encounter (Signed)
done

## 2016-08-30 NOTE — Telephone Encounter (Signed)
Patient called requesting a refill on her Toujeo. Patient stated that she takes 70 units twice per day (70u am and 70u pm) In patient's current medication list it is only written for 70 units once daily. Patient stated that she has been taking it for as long as she knows twice daily and her blood sugars have been managed. Patient would like a refill with these instructions. Please Advise.  (Dr. Eulas Post Pt)

## 2016-08-30 NOTE — Telephone Encounter (Signed)
Rx faxed to pharmacy per patient request for Toujeo 70 units into the skin twice daily (before breakfast and before supper)

## 2016-09-07 ENCOUNTER — Ambulatory Visit (INDEPENDENT_AMBULATORY_CARE_PROVIDER_SITE_OTHER): Payer: PPO | Admitting: Psychology

## 2016-09-07 ENCOUNTER — Encounter: Payer: Self-pay | Admitting: Psychology

## 2016-09-07 DIAGNOSIS — R413 Other amnesia: Secondary | ICD-10-CM

## 2016-09-07 NOTE — Progress Notes (Addendum)
NEUROPSYCHOLOGICAL INTERVIEW (CPT: [id number])  Name: [name] Date of Birth: 07-04-1934 Date of Interview: 09/07/2016  Reason for Referral:  [name] is a 82 y.o. female who is referred for neuropsychological evaluation by Dr. Metta Clines of [facility] due to concerns about memory loss. This patient is accompanied in the office by her daughter [name]) who supplements the history.  History of Presenting Problem:  [name] and her daughter report gradual onset of memory loss with decline over the past year. The patient endorsed forgetfulness for recent conversations/events, inability to track appointments, inability to track medications, difficulty concentrating, slowed processing speed (requires more time to process a question before she answers it), and word finding difficulty. Her daughter agreed with these symptoms and also reported that she repeats questions and statements. [name] lives with her daughter, Ledon Snare manages all complex ADLs, including driving, medication management, finances/bill paying, appointments and meals. The patient has not driven in 8-9 years. Her daughter has lived with her for 8 years.  The patient endorsed visual hallucinations which she says have been going on a while and are happening more frequently now. Her daughter has observed this and says this is a relatively new symptom. The patient, for example, thought she saw "a frog eating paper" on the floor last night and called her daughter over to look at it. She denied auditory hallucinations.   CT scan of the brain on 03/20/2016 showed no acute intracranial abnormality but stable white matter changes suggestive of chronic small vessel ischemic changes.   The patient has many health conditions and physical limitations. She lays in bed most of the day because this is more comfortable than sitting up. She reported very low energy. She stated that sometimes she is greatly fatigued just by walking to the  bathroom, and sometimes she does not make it to the bathroom. She uses a walker at home and a wheelchair outside of the home. She has had some falls and has hit her head but no LOC. She has significant sleep difficulty at night and dozes off during the day. She has sleep apnea and owns a CPAP but does not wear it.   She reported that if she is feeling good then her mood is good, but she does not feel good most days and her mood is down as a result. She endorsed passive, fleeting thoughts of wishing she was not alive, but she denied suicidal intention or plan. She stated she has seen a psychiatrist twice and wishes she could go regularly but she does not have the finances for this. Her daughter noted a personality change in that the patient used to be much more outspoken but now she has "settled down" and "just goes with the flow".  Prior psychiatric history was denied. There is no history of substance abuse or dependence.  There is no family history of dementia.    Social History: Born/Raised: [place] Education: High school  Occupational history: Primarily a homemaker Marital history: Widowed. 3 adult children, 7 grandchildren, 3 greats  Alcohol/Tobacco/Substances: No alcohol. Never a smoker. No SA.   Medical History: Past Medical History:  Diagnosis Date  . Allergy    takes Mucinex daily as needed  . Anemia, unspecified   . Anxiety    takes Clonazepam daily as needed  . Arthritis   . CHF (congestive heart failure) (HCC)    takes Furosemide daily  . Coronary artery disease    a. s/p IMI 2004 tx with  BMS to RCA;  b. s/p Promus DES to RCA 2/12 (cath: LM 20-30%, pLAD 20-30%, mLAD 50%, RI 30%, pRCA 60%, mRCA 90% - tx with PCI);  c. myoview 1/07: EF 49%, inf scar, no isch  . Coronary atherosclerosis of native coronary artery   . Depression    takes Cymbalta daily  . Diabetes mellitus    insulin daily  . Dysuria   . GERD (gastroesophageal reflux disease)    takes Protonix daily  . Gout,  unspecified    takes Colchicine daily  . Headache    occasionally  . History of blood clots about 48yrs ago   in legs-takes Coumadin   . Hyperlipidemia    takes Pravastatin daily  . Hypertension    takes Imdur and Metoprolol daily  . Insomnia   . Joint pain   . Joint swelling   . Myocardial infarction ([place]) 2004  . Numbness   . Obstructive sleep apnea   . Osteoarthritis   . Osteoarthritis   . Osteoarthrosis, unspecified whether generalized or localized, lower leg   . Pain, chronic   . Polymyalgia rheumatica ([place])   . Pulmonary emboli ([place]) 9/13   felt to need lifelong anticoagulation  . Shortness of breath dyspnea    with exertion and has Albuterol inhaler prn  . Type II or unspecified type diabetes mellitus without mention of complication, uncontrolled   . Urinary incontinence    takes Linzess daily  . Vertigo    hx of;was taking Meclizine if needed     Current Medications:  Outpatient Encounter Prescriptions as of 09/07/2016  Medication Sig  . albuterol (PROVENTIL HFA;VENTOLIN HFA) 108 (90 Base) MCG/ACT inhaler Inhale 1-2 puffs into the lungs every 6 (six) hours as needed for wheezing or shortness of breath.  [name] amoxicillin-clavulanate (AUGMENTIN) 875-125 MG tablet Take 1 tablet by mouth 2 (two) times daily.  [name] anastrozole (ARIMIDEX) 1 MG tablet Take 1 tablet (1 mg total) by mouth daily.  . budesonide-formoterol (SYMBICORT) 160-4.5 MCG/ACT inhaler Inhale 2 puffs into the lungs 2 (two) times daily as needed (shortness of breath).  . cetirizine (ZYRTEC) 10 MG tablet Take 10 mg by mouth at bedtime.  . ciprofloxacin (CIPRO) 500 MG tablet Take 1 tablet (500 mg total) by mouth 2 (two) times daily.  . clonazePAM (KLONOPIN) 1 MG tablet TAKE TWO TABLETS BY MOUTH AT BEDTIME AS NEEDED FOR SLEEP  . docusate sodium (COLACE) 100 MG capsule Take 100 mg by mouth daily as needed for mild constipation. TAKE PER BOTTLE AS NEEDED FOR CONSTIPATION   . DULoxetine (CYMBALTA) 60 MG capsule TAKE ONE  CAPSULE BY MOUTH ONCE DAILY FOR ANXIETY  . insulin aspart (NOVOLOG) 100 UNIT/ML injection Inject 30 Units into the skin 3 (three) times daily with meals.  . Insulin Glargine (TOUJEO SOLOSTAR) 300 UNIT/ML SOPN Inject 70 Units into the skin 2 (two) times daily. (before breakfast and before supper)  . isosorbide mononitrate (IMDUR) 60 MG 24 hr tablet TAKE ONE TABLET BY MOUTH ONCE DAILY  . Lancets (ONETOUCH ULTRASOFT) lancets Check blood sugar 2-3 times daily as directed  . lisinopril (PRINIVIL,ZESTRIL) 5 MG tablet Take 1 tablet (5 mg total) by mouth daily.  . meclizine (ANTIVERT) 25 MG tablet Take 1 tablet (25 mg total) by mouth 3 (three) times daily as needed for dizziness.  . metoprolol succinate (TOPROL-XL) 25 MG 24 hr tablet TAKE ONE TABLET BY MOUTH ONCE DAILY FOR  BLOOD  PRESSURE  . Multiple Vitamins-Minerals (HAIR/SKIN/NAILS/BIOTIN PO) Take 1 tablet by  mouth every evening.  . nitroGLYCERIN (NITROSTAT) 0.4 MG SL tablet Take one tablet under the tongue every 5 minutes as needed for chest pain  . ONETOUCH VERIO test strip Use to test blood sugar three times daily. Dx: E11.00  . oxyCODONE-acetaminophen (PERCOCET) 10-325 MG tablet Take 1 tablet by mouth every 6 (six) hours as needed for pain.  . pantoprazole (PROTONIX) 40 MG tablet TAKE ONE TABLET BY MOUTH ONCE DAILY  . pravastatin (PRAVACHOL) 20 MG tablet TAKE ONE TABLET BY MOUTH ONCE DAILY FOR CHOLESTEROL  . sertraline (ZOLOFT) 50 MG tablet Take 1 tablet (50 mg total) by mouth at bedtime.  . sodium chloride (OCEAN) 0.65 % SOLN nasal spray RINSE AS NEEDED FOR SINUS CONGESTION  . Spacer/Aero-Holding Chambers (AEROCHAMBER PLUS FLO-VU LARGE) MISC 1 each by Other route once.  . sucralfate (CARAFATE) 1 GM/10ML suspension Take 10 mLs (1 g total) by mouth 4 (four) times daily -  with meals and at bedtime.  . temazepam (RESTORIL) 30 MG capsule TAKE ONE CAPSULE BY MOUTH AT BEDTIME AS NEEDED FOR  SLEEP  . ULORIC 40 MG tablet TAKE ONE TABLET BY MOUTH ONCE  DAILY  . warfarin (COUMADIN) 5 MG tablet Take 1 & 1/2  tablets daily except 1 tablet on Tuesdays, and Thursdays   No facility-administered encounter medications on file as of 09/07/2016.      Behavioral Observations:   Appearance: Neatly and appropriately dressed and groomed Gait: N/A seated in wheelchair Speech: Fluent; normal rate, rhythm and volume Thought process: Linear, goal directed Affect: Blunted, euthymic Interpersonal: Pleasant, appropriate   TESTING: There is medical necessity to proceed with neuropsychological assessment as the results will be used to aid in differential diagnosis and clinical decision-making and to inform specific treatment recommendations. Per the patient, her daughter and medical records reviewed, there has been a change in cognitive functioning and a reasonable suspicion of dementia. Following the clinical interview, the patient completed a full battery of neuropsychological testing with my psychometrician under my supervision.   PLAN: The patient will see me for a follow-up session in 1-2 weeks at which time her test performances and my impressions and treatment recommendations will be reviewed in detail.   Full neuropsychological evaluation report to follow.

## 2016-09-07 NOTE — Progress Notes (Signed)
   Neuropsychology Note  [name] came in today for 1 hour of neuropsychological testing with technician, Milana Kidney, BS, under the supervision of Dr. Macarthur Critchley. The patient did not appear overtly distressed by the testing session, per behavioral observation or via self-report to the technician. Rest breaks were offered. [name] will return within 2 weeks for a feedback session with Dr. Si Raider at which time her test performances, clinical impressions and treatment recommendations will be reviewed in detail. The patient understands she can contact our office should she require our assistance before this time.  Full report to follow.

## 2016-09-11 ENCOUNTER — Ambulatory Visit (INDEPENDENT_AMBULATORY_CARE_PROVIDER_SITE_OTHER): Payer: PPO | Admitting: Pharmacotherapy

## 2016-09-11 ENCOUNTER — Encounter: Payer: Self-pay | Admitting: Pharmacotherapy

## 2016-09-11 VITALS — BP 138/80 | HR 75 | Resp 12 | Ht 67.0 in | Wt 337.0 lb

## 2016-09-11 DIAGNOSIS — I2782 Chronic pulmonary embolism: Secondary | ICD-10-CM

## 2016-09-11 DIAGNOSIS — G894 Chronic pain syndrome: Secondary | ICD-10-CM | POA: Diagnosis not present

## 2016-09-11 DIAGNOSIS — Z7901 Long term (current) use of anticoagulants: Secondary | ICD-10-CM

## 2016-09-11 LAB — POCT INR: INR: 4.6

## 2016-09-11 MED ORDER — WARFARIN SODIUM 5 MG PO TABS: ORAL_TABLET | ORAL | 5 refills | Status: DC

## 2016-09-11 MED ORDER — OXYCODONE-ACETAMINOPHEN 10-325 MG PO TABS: 1.0000 | ORAL_TABLET | Freq: Four times a day (QID) | ORAL | 0 refills | Status: DC | PRN

## 2016-09-11 NOTE — Patient Instructions (Signed)
INR 4.6  No Coumadin x 1 dose  Then start Coumadin 7.5mg  (1 & 1/2 tablets) daily except 5mg  (1 tablet) on Mondays, Wednesdays, and Fridays

## 2016-09-11 NOTE — Progress Notes (Signed)
   Subjective:    Patient ID: [name], female    DOB: [birth year], 82 y.o.   MRN: [id number]  HPI Last INR on 08/14/16 was OK at 2.8 Current Coumadin dose is 7.24m QD except 5169mT/Th Denies missed doses Denies unusual bleeding or bruising Having more CP and SOB related to cough and congestion.  Has been taking many OTC items to try to resolve symptoms. Consistent with vitamin K intake   Review of Systems  Constitutional: Positive for fatigue. Negative for chills and fever.  HENT: Positive for postnasal drip and rhinorrhea. Negative for nosebleeds.   Respiratory: Positive for shortness of breath.   Cardiovascular: Positive for chest pain.  Gastrointestinal: Negative for anal bleeding and blood in stool.  Genitourinary: Negative for hematuria.  Hematological: Does not bruise/bleed easily.       Objective:   Physical Exam  Constitutional: She is oriented to person, place, and time. She appears well-developed and well-nourished.  HENT:  Right Ear: External ear normal.  Left Ear: External ear normal.  Cardiovascular: Normal rate, regular rhythm and normal heart sounds.   Pulmonary/Chest: Effort normal and breath sounds normal.  Neurological: She is alert and oriented to person, place, and time.  Skin: Skin is warm and dry.  Psychiatric: She has a normal mood and affect. Her behavior is normal. Judgment and thought content normal.  Vitals reviewed.    BP: 138/80  HR: 75  Wt: 337lb INR 4.6     Assessment & Plan:  1.  INR above goal 2-3 due to OTC medication / warfarin interactions 2.  Hold Coumadin x 1 dose 3.  Then start Coumadin 7.69m54mD except 69mg67mF 4.  RTC 2 weeks

## 2016-09-12 ENCOUNTER — Encounter: Payer: Self-pay | Admitting: Nurse Practitioner

## 2016-09-12 ENCOUNTER — Ambulatory Visit (INDEPENDENT_AMBULATORY_CARE_PROVIDER_SITE_OTHER): Payer: PPO | Admitting: Nurse Practitioner

## 2016-09-12 VITALS — BP 126/78 | HR 85 | Temp 98.1°F

## 2016-09-12 DIAGNOSIS — I10 Essential (primary) hypertension: Secondary | ICD-10-CM | POA: Diagnosis not present

## 2016-09-12 DIAGNOSIS — R059 Cough, unspecified: Secondary | ICD-10-CM

## 2016-09-12 DIAGNOSIS — Z9189 Other specified personal risk factors, not elsewhere classified: Secondary | ICD-10-CM | POA: Diagnosis not present

## 2016-09-12 DIAGNOSIS — R05 Cough: Secondary | ICD-10-CM | POA: Diagnosis not present

## 2016-09-12 MED ORDER — LOSARTAN POTASSIUM 25 MG PO TABS
25.0000 mg | ORAL_TABLET | Freq: Every day | ORAL | 1 refills | Status: DC
Start: 2016-09-12 — End: 2017-01-25

## 2016-09-12 NOTE — Progress Notes (Signed)
Careteam: Patient Care Team: Gildardo Cranker, DO as PCP - General (Internal Medicine) [name], [name], MD (Inactive) as Consulting Physician (Cardiology) [name], MD as Consulting Physician (Ophthalmology) [name], MD as Consulting Physician (General Surgery) [name], MD as Referring Physician (Obstetrics and Gynecology)  Advanced Directive information Does Patient Have a Medical Advance Directive?: Yes, Type of Advance Directive: Living will  Allergies  Allergen Reactions  . Sulfonamide Derivatives Swelling    Mouth swelling  . Tramadol Nausea And Vomiting    Chief Complaint  Patient presents with  . Cough    patient states she has had cough for years but has started to get worse a few months ago, same at night,nothing helps, shes tried cough syrups and cough drops.  . Angioedema    patient complains of right side swelling  . Hip Pain    left hip pain hurts to move     HPI: Patient is a 82 y.o. female seen in the office today due to cough. Has had a cough for years but it is getting worse.  Had to put something in her mouth to help with the cough or she will cough all day and night.  In the morning sputum is green but then turns to clear and this has been unchanged.  Has had chronic shortness of breath but this has progressively gotten worse. No increase in edema. No weight gain. Occasional wheezing- using albuterol once every 2 weeks.  Has GERD- taking Protonix daily but still has indigestion.  Poor memory- not taking any temazepam or clonazepam  Admitted to taking lorazepam from her friend   Review of Systems:  Review of Systems  Constitutional: Negative for activity change, appetite change, fatigue and unexpected weight change.  HENT: Negative for congestion and hearing loss.   Eyes: Negative.   Respiratory: Positive for cough. Negative for shortness of breath.   Cardiovascular: Negative for chest pain, palpitations and leg swelling.    Gastrointestinal: Negative for abdominal pain, constipation and diarrhea.  Genitourinary: Negative for difficulty urinating and dysuria.  Musculoskeletal: Negative for arthralgias and myalgias.  Skin: Negative for color change and wound.  Neurological: Negative for dizziness and weakness.  Psychiatric/Behavioral: Positive for confusion and decreased concentration. Negative for agitation and behavioral problems.    Past Medical History:  Diagnosis Date  . Allergy    takes Mucinex daily as needed  . Anemia, unspecified   . Anxiety    takes Clonazepam daily as needed  . Arthritis   . CHF (congestive heart failure) (HCC)    takes Furosemide daily  . Coronary artery disease    a. s/p IMI 2004 tx with BMS to RCA;  b. s/p Promus DES to RCA 2/12 (cath: LM 20-30%, pLAD 20-30%, mLAD 50%, RI 30%, pRCA 60%, mRCA 90% - tx with PCI);  c. myoview 1/07: EF 49%, inf scar, no isch  . Coronary atherosclerosis of native coronary artery   . Depression    takes Cymbalta daily  . Diabetes mellitus    insulin daily  . Dysuria   . GERD (gastroesophageal reflux disease)    takes Protonix daily  . Gout, unspecified    takes Colchicine daily  . Headache    occasionally  . History of blood clots about 75yrs ago   in legs-takes Coumadin   . Hyperlipidemia    takes Pravastatin daily  . Hypertension    takes Imdur and Metoprolol daily  . Insomnia   . Joint pain   .  Joint swelling   . Myocardial infarction ([place]) 2004  . Numbness   . Obstructive sleep apnea   . Osteoarthritis   . Osteoarthritis   . Osteoarthrosis, unspecified whether generalized or localized, lower leg   . Pain, chronic   . Polymyalgia rheumatica ([place])   . Pulmonary emboli ([place]) 9/13   felt to need lifelong anticoagulation  . Shortness of breath dyspnea    with exertion and has Albuterol inhaler prn  . Type II or unspecified type diabetes mellitus without mention of complication, uncontrolled   . Urinary incontinence    takes  Linzess daily  . Vertigo    hx of;was taking Meclizine if needed   Past Surgical History:  Procedure Laterality Date  . ABDOMINAL HYSTERECTOMY     partial  . APPENDECTOMY    . blood clots/legs and lungs  2013  . BREAST BIOPSY Left 07/22/2014  . BREAST BIOPSY Left 02/10/2013  . BREAST LUMPECTOMY Left 11/05/2014  . BREAST LUMPECTOMY WITH RADIOACTIVE SEED LOCALIZATION Left 11/05/2014   Procedure: LEFT BREAST LUMPECTOMY WITH RADIOACTIVE SEED LOCALIZATION;  Surgeon: [name], MD;  Location: [place];  Service: General;  Laterality: Left;  . CARDIAC CATHETERIZATION    . COLONOSCOPY    . CORONARY ANGIOPLASTY  2  . EXCISION OF SKIN TAG Right 11/05/2014   Procedure: EXCISION OF RIGHT EYELID SKIN TAG;  Surgeon: [name], MD;  Location: [place];  Service: General;  Laterality: Right;  . EYE SURGERY Bilateral    cataract   . GASTRIC BYPASS  1977    reversed in 1979, [place] N/A 06/29/2014   Procedure: LEFT HEART CATHETERIZATION WITH CORONARY ANGIOGRAM;  Surgeon: Troy Sine, MD;  Location: [facility] CATH LAB;  Service: Cardiovascular;  Laterality: N/A;  . MI with stent placement  2004   Social History:   reports that she has never smoked. She has never used smokeless tobacco. She reports that she does not drink alcohol or use drugs.  Family History  Problem Relation Age of Onset  . Breast cancer Mother 78  . Heart disease Mother   . Throat cancer Father   . Hypertension Father   . Arthritis Father   . Diabetes Father   . Arthritis Sister   . Obesity Sister   . Diabetes Sister   . Heart disease Cousin     Medications: Patient's Medications  New Prescriptions   No medications on file  Previous Medications   ALBUTEROL (PROVENTIL HFA;VENTOLIN HFA) 108 (90 BASE) MCG/ACT INHALER    Inhale 1-2 puffs into the lungs every 6 (six) hours as needed for wheezing or shortness of breath.   ANASTROZOLE (ARIMIDEX) 1 MG TABLET     Take 1 tablet (1 mg total) by mouth daily.   BUDESONIDE-FORMOTEROL (SYMBICORT) 160-4.5 MCG/ACT INHALER    Inhale 2 puffs into the lungs 2 (two) times daily as needed (shortness of breath).   CETIRIZINE (ZYRTEC) 10 MG TABLET    Take 10 mg by mouth at bedtime.   DOCUSATE SODIUM (COLACE) 100 MG CAPSULE    Take 100 mg by mouth daily as needed for mild constipation. TAKE PER BOTTLE AS NEEDED FOR CONSTIPATION    DULOXETINE (CYMBALTA) 60 MG CAPSULE    TAKE ONE CAPSULE BY MOUTH ONCE DAILY FOR ANXIETY   INSULIN ASPART (NOVOLOG) 100 UNIT/ML INJECTION    Inject 30 Units into the skin 3 (three) times daily with meals.   INSULIN GLARGINE (TOUJEO SOLOSTAR) 300  UNIT/ML SOPN    Inject 70 Units into the skin 2 (two) times daily. (before breakfast and before supper)   ISOSORBIDE MONONITRATE (IMDUR) 60 MG 24 HR TABLET    TAKE ONE TABLET BY MOUTH ONCE DAILY   LANCETS (ONETOUCH ULTRASOFT) LANCETS    Check blood sugar 2-3 times daily as directed   LISINOPRIL (PRINIVIL,ZESTRIL) 5 MG TABLET    Take 1 tablet (5 mg total) by mouth daily.   MECLIZINE (ANTIVERT) 25 MG TABLET    Take 1 tablet (25 mg total) by mouth 3 (three) times daily as needed for dizziness.   METOPROLOL SUCCINATE (TOPROL-XL) 25 MG 24 HR TABLET    TAKE ONE TABLET BY MOUTH ONCE DAILY FOR  BLOOD  PRESSURE   MULTIPLE VITAMINS-MINERALS (HAIR/SKIN/NAILS/BIOTIN PO)    Take 1 tablet by mouth every evening.   NITROGLYCERIN (NITROSTAT) 0.4 MG SL TABLET    Take one tablet under the tongue every 5 minutes as needed for chest pain   ONETOUCH VERIO TEST STRIP    Use to test blood sugar three times daily. Dx: E11.00   OXYCODONE-ACETAMINOPHEN (PERCOCET) 10-325 MG TABLET    Take 1 tablet by mouth every 6 (six) hours as needed for pain.   PANTOPRAZOLE (PROTONIX) 40 MG TABLET    TAKE ONE TABLET BY MOUTH ONCE DAILY   PRAVASTATIN (PRAVACHOL) 20 MG TABLET    TAKE ONE TABLET BY MOUTH ONCE DAILY FOR CHOLESTEROL   SERTRALINE (ZOLOFT) 50 MG TABLET    Take 1 tablet (50 mg total) by  mouth at bedtime.   SODIUM CHLORIDE (OCEAN) 0.65 % SOLN NASAL SPRAY    RINSE AS NEEDED FOR SINUS CONGESTION   SPACER/AERO-HOLDING CHAMBERS (AEROCHAMBER PLUS FLO-VU LARGE) MISC    1 each by Other route once.   SUCRALFATE (CARAFATE) 1 GM/10ML SUSPENSION    Take 10 mLs (1 g total) by mouth 4 (four) times daily -  with meals and at bedtime.   ULORIC 40 MG TABLET    TAKE ONE TABLET BY MOUTH ONCE DAILY   WARFARIN (COUMADIN) 5 MG TABLET    Take 1 & 1/2  tablets daily except 1 tablet on Mondays, Wednesdays, and Fridays  Modified Medications   No medications on file  Discontinued Medications   CIPROFLOXACIN (CIPRO) 500 MG TABLET    Take 1 tablet (500 mg total) by mouth 2 (two) times daily.   CLONAZEPAM (KLONOPIN) 1 MG TABLET    TAKE TWO TABLETS BY MOUTH AT BEDTIME AS NEEDED FOR SLEEP   TEMAZEPAM (RESTORIL) 30 MG CAPSULE    TAKE ONE CAPSULE BY MOUTH AT BEDTIME AS NEEDED FOR  SLEEP     Physical Exam:  Vitals:   09/12/16 1602  BP: 126/78  Pulse: 85  Temp: 98.1 F (36.7 C)  TempSrc: Oral  SpO2: 98%   There is no height or weight on file to calculate BMI.  Physical Exam  Constitutional: She is oriented to person, place, and time. She appears well-developed and well-nourished.  HENT:  Right Ear: External ear normal.  Left Ear: External ear normal.  Cardiovascular: Normal rate, regular rhythm and normal heart sounds.   Pulmonary/Chest: Effort normal and breath sounds normal. No respiratory distress.  Abdominal: Soft. Bowel sounds are normal.  obese  Neurological: She is alert and oriented to person, place, and time.  Skin: Skin is warm and dry.  Psychiatric: She has a normal mood and affect. Her behavior is normal.  Vitals reviewed.  Labs reviewed: Basic Metabolic Panel:  Recent  Labs  04/24/16 1247 07/23/16 1023 07/27/16 1027 08/02/16 1551  NA 139 138 136  --   K 4.4 4.8 5.5*  --   CL 105 103 102  --   CO2 24 27 26   --   GLUCOSE 148* 161* 176*  --   BUN 28* 21* 39*  --     CREATININE 1.22* 1.44* 1.40*  --   CALCIUM 8.6 8.9 8.9  --   TSH  --   --   --  1.52   Liver Function Tests:  Recent Labs  12/13/15 1333 04/24/16 1247 07/27/16 1027  AST 20 12 14   ALT 16 8 9   ALKPHOS 78 73 84  BILITOT 0.55 0.3 0.3  PROT 7.8 7.4 7.5  ALBUMIN 2.8* 3.3* 3.3*   No results for input(s): LIPASE, AMYLASE in the last 8760 hours. No results for input(s): AMMONIA in the last 8760 hours. CBC:  Recent Labs  03/20/16 1140 07/23/16 1023 07/27/16 1027 08/11/16 1130  WBC 10.0 11.8* 14.6* 13.5*  NEUTROABS 6.8  --  10,658* 8,910*  HGB 10.0* 9.7* 9.9* 9.3*  HCT 33.1* 31.7* 32.4* 29.7*  MCV 87.6 86.1 84.6 84.4  PLT 344 317 361 356   Lipid Panel:  Recent Labs  06/23/16 1455  CHOL 102  HDL 36*  LDLCALC 47  TRIG 95  CHOLHDL 2.8   TSH:  Recent Labs  08/02/16 1551  TSH 1.52   A1C: Lab Results  Component Value Date   HGBA1C 8.6 (H) 04/24/2016     Assessment/Plan 1. Cough -may be multifactoral has pt with hx of COPD, GERD and obesity.  -will start by changing ACEi to ARB. To stop lisinopril  - losartan (COZAAR) 25 MG tablet; Take 1 tablet (25 mg total) by mouth daily.  Dispense: 30 tablet; Refill: 1 -to  Monitor for increase GERD/indigestion.   2. At risk for adverse drug event -pt is taking her friends medication to help her sleep. When questioned about this she does not remember taking medication prior to last OV when drug screen was administered but admitted to taking medication prior to today's visit. Discussed in detail about risk for overdose, respiratory depression and possible death due to taking too many medication and medications that were not prescribed in combination with her prescription medication.  Pt reports she is no longer taking sleep aides we have prescribed therefore will remove from her list (Restoril and klonopin)   3. Essential hypertension Stable, will cont to monitor since changing lisinopril to losartan due to cough  To  follow up with Dr Eulas Post in 1 month for routine followup.    Carlos American. Harle Battiest  [facility] [phone] 8 am - 5 pm) [phone] (after hours)

## 2016-09-12 NOTE — Patient Instructions (Addendum)
Stop Lisinopril  start Losartan 25 mg daily  Also to be aware of indigestion/acid reflux symptoms.  If ranitidine 150 mg daily twice daily   Follow up with Dr Eulas Post for routine follow up in 1 month

## 2016-09-14 ENCOUNTER — Ambulatory Visit: Payer: PPO | Admitting: Internal Medicine

## 2016-09-14 ENCOUNTER — Other Ambulatory Visit: Payer: Self-pay

## 2016-09-14 DIAGNOSIS — Z794 Long term (current) use of insulin: Principal | ICD-10-CM

## 2016-09-14 DIAGNOSIS — E1165 Type 2 diabetes mellitus with hyperglycemia: Principal | ICD-10-CM

## 2016-09-14 DIAGNOSIS — E1121 Type 2 diabetes mellitus with diabetic nephropathy: Secondary | ICD-10-CM

## 2016-09-14 DIAGNOSIS — IMO0002 Reserved for concepts with insufficient information to code with codable children: Secondary | ICD-10-CM

## 2016-09-14 DIAGNOSIS — E1122 Type 2 diabetes mellitus with diabetic chronic kidney disease: Secondary | ICD-10-CM

## 2016-09-14 NOTE — Telephone Encounter (Signed)
Spoke to patient she stated that she need a refill on Toujeo, patient stated that she is taking 70 units 2 times daily.   I called pharmacy to order refill pharmacy stated that patient should not be out of medication yet if she is taking 70 units 2 times daily. Also stated that insurance would not cover anymore medication if the doctors order states that patients should be taking 70 units 2 times daily. Patient has not picked up Novolog in about 3 months per the pharmacy the medication cost is 195.   Called patient back to verify how much Toujeo patient is taking daily.patient stated that she is taking 70 units in the am and 70 units in th pm.  Patient states she is trying to lose weight so she is not taking Novolog as much. Patient still has refills on the novolog for her to pick up. Per Carlis Abbott she only picked up 1 box of pens due to the cost of the medication she usually gets two boxes, but the 1 box will not last her 20 days.    Called pharmacy stated that she picked up on the April 25th and is supposed to last at least 9 days. Insurance will pay for [facility] June 4. Pharmacy is going to call insurance company again to see if they can get a over ride.   Spoke with patient to inform her that pharmacy will call insurance company to try and get over ride. And they will contact us with the results. I also looked in sample closet and tried to locate drug rep for samples.

## 2016-09-15 ENCOUNTER — Telehealth: Payer: Self-pay

## 2016-09-15 NOTE — Telephone Encounter (Signed)
Patient called and stated that her pharmacy does not have the right rx on file for Toujeo to reflect 70 units two times daily  I called patient to confirm instructions, patient confirmed and denied taking more than prescribed.  I called the pharmacy and spoke with [name] and she confirmed rx was received on 08/30/16 that reflect 70 units twice daily.  Velia Meyer states based on the information they have patient is not due for additional refills until June 2018. The pharmacist placed a ticket to get an override from the insurance company to release now since patient is about out of medication (refer to phone note dated 09/14/16) Velia Meyer states they will follow-up with the patient once they hear back from the insurance company  I called patient back with a status update. Patient instructed to call if she any additional concerns. Further follow-up on Toujeo will come from [place].

## 2016-09-15 NOTE — Telephone Encounter (Signed)
Refer to phone note dated 09/15/2016

## 2016-09-18 NOTE — Progress Notes (Signed)
NEUROPSYCHOLOGICAL EVALUATION   Name:    [name]  Date of Birth:   Jan 26, 1935 Date of Interview:  09/07/2016 Date of Testing:  09/07/2016   Date of Feedback:  09/19/2016       Background Information:  Reason for Referral:  [name] is a 82 y.o. female referred by Dr. Metta Clines to assess her current level of cognitive functioning and assist in differential diagnosis. The current evaluation consisted of a review of available medical records, an interview with the patient and her daughter [name]), and the completion of a neuropsychological testing battery. Informed consent was obtained.  History of Presenting Problem:  [name] and her daughter report gradual onset of memory loss with decline over the past year. The patient endorsed forgetfulness for recent conversations/events, inability to track appointments, inability to track medications, difficulty concentrating, slowed processing speed (requires more time to process a question before she answers it), and word finding difficulty. Her daughter agreed with these symptoms and also reported that she repeats questions and statements. [name] lives with her daughter, [name] manages all complex ADLs, including driving, medication management, finances/bill paying, appointments and meals. The patient has not driven in 8-9 years. Her daughter has lived with her for 8 years.  The patient endorsed visual hallucinations which she says have been going on a while and are happening more frequently now. Her daughter has observed this and says this is a relatively new symptom. The patient, for example, thought she saw "a frog eating paper" on the floor last night and called her daughter over to look at it. She denied auditory hallucinations.   CT scan of the brain on 03/20/2016 showed no acute intracranial abnormality but stable white matter changes suggestive of chronic small vessel ischemic changes.   The patient has many health  conditions and physical limitations. She lays in bed most of the day because this is more comfortable than sitting up. She reported very low energy. She stated that sometimes she is greatly fatigued just by walking to the bathroom, and sometimes she does not make it to the bathroom. She uses a walker at home and a wheelchair outside of the home. She has had some falls and has hit her head but no LOC. She has significant sleep difficulty at night and dozes off during the day. She has sleep apnea and owns a CPAP but does not wear it.   She reported that if she is feeling good then her mood is good, but she does not feel good most days and her mood is down as a result. She endorsed passive, fleeting thoughts of wishing she was not alive, but she denied suicidal intention or plan. She stated she has seen a psychiatrist twice and wishes she could go regularly but she does not have the finances for this. She reported that she had depression prior to her husband's death but it has greatly increased since he passed away. She reported she does not feel Sertraline 50 mg is helping her. Her daughter noted a personality change in that the patient used to be much more outspoken but now she has "settled down" and "just goes with the flow".  Other psychiatric history was denied. There is no history of substance abuse or dependence.  There is no family history of dementia.    Social History: Born/Raised: [place] Education: High school  Occupational history: Primarily a homemaker Marital history: Widowed. 3 adult children, 7 grandchildren, 3 greats  Alcohol/Tobacco/Substances:  No alcohol. Never a smoker. No SA.   Medical History:  Past Medical History:  Diagnosis Date  . Allergy    takes Mucinex daily as needed  . Anemia, unspecified   . Anxiety    takes Clonazepam daily as needed  . Arthritis   . CHF (congestive heart failure) (HCC)    takes Furosemide daily  . Coronary artery disease    a. s/p IMI 2004  tx with BMS to RCA;  b. s/p Promus DES to RCA 2/12 (cath: LM 20-30%, pLAD 20-30%, mLAD 50%, RI 30%, pRCA 60%, mRCA 90% - tx with PCI);  c. myoview 1/07: EF 49%, inf scar, no isch  . Coronary atherosclerosis of native coronary artery   . Depression    takes Cymbalta daily  . Diabetes mellitus    insulin daily  . Dysuria   . GERD (gastroesophageal reflux disease)    takes Protonix daily  . Gout, unspecified    takes Colchicine daily  . Headache    occasionally  . History of blood clots about 50yrs ago   in legs-takes Coumadin   . Hyperlipidemia    takes Pravastatin daily  . Hypertension    takes Imdur and Metoprolol daily  . Insomnia   . Joint pain   . Joint swelling   . Myocardial infarction ([place]) 2004  . Numbness   . Obstructive sleep apnea   . Osteoarthritis   . Osteoarthritis   . Osteoarthrosis, unspecified whether generalized or localized, lower leg   . Pain, chronic   . Polymyalgia rheumatica ([place])   . Pulmonary emboli ([place]) 9/13   felt to need lifelong anticoagulation  . Shortness of breath dyspnea    with exertion and has Albuterol inhaler prn  . Type II or unspecified type diabetes mellitus without mention of complication, uncontrolled   . Urinary incontinence    takes Linzess daily  . Vertigo    hx of;was taking Meclizine if needed    Current medications:  Outpatient Encounter Prescriptions as of 09/19/2016  Medication Sig  . albuterol (PROVENTIL HFA;VENTOLIN HFA) 108 (90 Base) MCG/ACT inhaler Inhale 1-2 puffs into the lungs every 6 (six) hours as needed for wheezing or shortness of breath.  . anastrozole (ARIMIDEX) 1 MG tablet Take 1 tablet (1 mg total) by mouth daily.  . budesonide-formoterol (SYMBICORT) 160-4.5 MCG/ACT inhaler Inhale 2 puffs into the lungs 2 (two) times daily as needed (shortness of breath).  . cetirizine (ZYRTEC) 10 MG tablet Take 10 mg by mouth at bedtime.  . docusate sodium (COLACE) 100 MG capsule Take 100 mg by mouth daily as needed for  mild constipation. TAKE PER BOTTLE AS NEEDED FOR CONSTIPATION   . DULoxetine (CYMBALTA) 60 MG capsule TAKE ONE CAPSULE BY MOUTH ONCE DAILY FOR ANXIETY  . insulin aspart (NOVOLOG) 100 UNIT/ML injection Inject 30 Units into the skin 3 (three) times daily with meals.  . Insulin Glargine (TOUJEO SOLOSTAR) 300 UNIT/ML SOPN Inject 70 Units into the skin 2 (two) times daily. (before breakfast and before supper)  . isosorbide mononitrate (IMDUR) 60 MG 24 hr tablet TAKE ONE TABLET BY MOUTH ONCE DAILY  . Lancets (ONETOUCH ULTRASOFT) lancets Check blood sugar 2-3 times daily as directed  . losartan (COZAAR) 25 MG tablet Take 1 tablet (25 mg total) by mouth daily.  . meclizine (ANTIVERT) 25 MG tablet Take 1 tablet (25 mg total) by mouth 3 (three) times daily as needed for dizziness.  . metoprolol succinate (TOPROL-XL) 25 MG 24 hr tablet  TAKE ONE TABLET BY MOUTH ONCE DAILY FOR  BLOOD  PRESSURE  . Multiple Vitamins-Minerals (HAIR/SKIN/NAILS/BIOTIN PO) Take 1 tablet by mouth every evening.  . nitroGLYCERIN (NITROSTAT) 0.4 MG SL tablet Take one tablet under the tongue every 5 minutes as needed for chest pain  . ONETOUCH VERIO test strip Use to test blood sugar three times daily. Dx: E11.00  . oxyCODONE-acetaminophen (PERCOCET) 10-325 MG tablet Take 1 tablet by mouth every 6 (six) hours as needed for pain.  . pantoprazole (PROTONIX) 40 MG tablet TAKE ONE TABLET BY MOUTH ONCE DAILY  . pravastatin (PRAVACHOL) 20 MG tablet TAKE ONE TABLET BY MOUTH ONCE DAILY FOR CHOLESTEROL  . sertraline (ZOLOFT) 50 MG tablet Take 1 tablet (50 mg total) by mouth at bedtime.  . sodium chloride (OCEAN) 0.65 % SOLN nasal spray RINSE AS NEEDED FOR SINUS CONGESTION  . Spacer/Aero-Holding Chambers (AEROCHAMBER PLUS FLO-VU LARGE) MISC 1 each by Other route once.  [name] ULORIC 40 MG tablet TAKE ONE TABLET BY MOUTH ONCE DAILY  . warfarin (COUMADIN) 5 MG tablet Take 1 & 1/2  tablets daily except 1 tablet on Mondays, Wednesdays, and Fridays   No  facility-administered encounter medications on file as of 09/19/2016.      Current Examination:  Behavioral Observations:   Appearance: Neatly and appropriately dressed and groomed Gait: N/A seated in wheelchair Speech: Fluent; normal rate, rhythm and volume Thought process: Linear, goal directed Affect: Blunted, euthymic Interpersonal: Pleasant, appropriate Orientation: Oriented to person, place and most aspects of time (one day off on the date). Accurately named the current President and his predecessor.  Tests Administered: . Test of Premorbid Functioning (TOPF) . Wechsler Adult Intelligence Scale-Fourth Edition (WAIS-IV): Similarities, Music therapist, Coding and Digit Span subtests . Wechsler Memory Scale-Fourth Edition (WMS-IV) Older Adult Version (ages 10-90): Logical Memory I, II and Recognition subtests  . [employer] Verbal Learning Test - 2nd Edition (CVLT-2) Short Form . Repeatable Battery for the Assessment of Neuropsychological Status (RBANS) Form A:  Figure Copy and Recall subtests and Semantic Fluency subtest . Neuropsychological Assessment Battery (NAB) Language Module, Form 1: Naming subtest . [place] Diagnostic Aphasia Examination: Complex Ideational Material subtest . Controlled Oral Word Association Test (COWAT) . Trail Making Test A and B . Clock drawing test . Geriatric Depression Scale (GDS) 15 Item . Generalized Anxiety Disorder - 7 item screener (GAD-7)  Test Results: Note: Standardized scores are presented only for use by appropriately trained professionals and to allow for any future test-retest comparison. These scores should not be interpreted without consideration of all the information that is contained in the rest of the report. The most recent standardization samples from the test publisher or other sources were used whenever possible to derive standard scores; scores were corrected for age, gender, ethnicity and education when available.   Test  Scores:  Test Name Raw Score Standardized Score Descriptor  TOPF 14/70 SS= 76 Borderline  WAIS-IV Subtests     Similarities 19/36 ss= 8 Average  Block Design 12/66 ss= 5 Borderline  Coding 14/135 ss= 4 Impaired  Digit Span Forward 12/16 ss= 13 High average  Digit Span Backward 5/16 ss= 6 Low average  WMS-IV Subtests     LM I 22/53 ss= 7 Low average  LM II 2/39 ss= 4 Impaired  LM II Recognition 17/23 Cum %: 26-50 WNL  RBANS Subtests     Figure Copy 20/20 Z= 1.2 High average  Figure Recall 10/20 Z= -0.6 Average  Semantic Fluency 19/40 Z= -0.2 Average  CVLT-II Scores     Trial 1 3/9 Z= -2.5 Impaired  Trial 4 5/9 Z= -2 Impaired  Trials 1-4 total 15/36 T= 24 Severely impaired  SD Free Recall 3/9 Z= -2 Impaired  LD Free Recall 4/9 Z= -1 Low average  LD Cued Recall 4/9 Z= -1 Low average  Recognition Discriminability 5/9 hits, 0 false positives Z= -1 Low average  Forced Choice Recognition 8/9  Abnormal  NAB Naming 24/31 T= 32 Borderline  BDAE Complex Ideational Material 10/12    COWAT-FAS 20 T= 38 Low average  COWAT-Animals 10 T= 35 Borderline  Trail Making Test A  106"  0 errors T= 17 Severely impaired  Trail Making Test B Pt unable  Severely impaired  Clock Drawing   Impaired  GDS-15 12/15  Severe  GAD-7 9/21  Mild      Description of Test Results:  Premorbid verbal intellectual abilities were estimated to have been within the borderline range based on a test of word reading. Psychomotor processing speed was impaired. Auditory attention and working memory were high average to low average, respectively. Visual-spatial construction was variable. Specifically, her drawn copy of a complex geometric figure was high average, while her manipulation of three dimensional blocks to match a model was borderline impaired. Language abilities were somewhat variable. Specifically, confrontation naming was borderline impaired, and semantic verbal fluency ranged from borderline impaired for  animals to average for fruits/vegetables. Auditory comprehension of complex ideational material was somewhat below expectation. With regard to verbal memory, encoding and acquisition of non-contextual information (i.e., word list) was severely impaired. After a brief distracter task, free recall was impaired (3/9 items recalled). After a delay, free recall was low average (4/9 items recalled). She did not benefit from semantic cueing. Performance on a yes/no recognition task was impaired due to decreased number of target hits. On another verbal memory test, encoding and acquisition of contextual auditory information (i.e., short stories) was low average. After a delay, free recall was impaired. Performance on a yes/no recognition task, however, was within normal limits. With regard to non-verbal memory, delayed free recall of visual information was average. Performance across tasks measuring executive functioning was variable. Mental flexibility and set-shifting were severely impaired; she was unable to complete Trails [address]. Verbal fluency with phonemic search restrictions was low average. Verbal abstract reasoning was average. Performance on a clock drawing task was impaired due to poor planning and organization. On self-report questionnaires, the patient's responses were indicative of clinically significant depression at the present time. Symptoms endorsed included: dissatisfaction with life, dropping activities/interests, feeling that life is empty, boredom, not feeling in good spirits most of the time, unhappiness, helplessness, preferring to stay home, feelings of worthlessness and lack of energy.    Clinical Impressions: Major Depressive Disorder, Severe. Mild cognitive impairment (likely related to chronic microvascular ischemia and untreated sleep apnea). Results of cognitive testing were notable for impaired performances on measures of psychomotor processing speed, auditory memory retrieval, and some  executive dysfunction. This profile is more in line with frontal-subcortical involvement than a cortical dementia such as Alzheimer's disease. At this time I do not suspect underlying Alzheimer's disease. She does have multiple vascular risk factors, untreated sleep apnea and neuroimaging evidence of chronic microvascular ischemic changes, all of which could contribute to cognitive changes of the type presented on this evaluation. Additionally, clinical interview, observations and self-report measures all indicated severe depression, which is likely exacerbating any underlying vascular cognitive impairment.    Recommendations/Plan: Based on the findings  of the present evaluation, the following recommendations are offered:  1. The patient has been diagnosed with sleep apnea in the past but reports that she could not tolerate the CPAP. I explained to the patient the impact of untreated sleep apnea on brain function and that these changes can be reversed over time through proper treatment. She is encouraged to work with her PCP and/or sleep specialist to try to use the CPAP again. Written information on this topic was provided. 2. Optimal control of other vascular risk factors is also necessary to reduce the risk of further vascular cognitive impairment. I explained how vascular risk factors can cause vascular cognitive impairment. 3. Better treatment of her depression would help her not only feel better but likely function better cognitively. She is prescribed Cymbalta and Sertraline 50 mg. An increase in Sertraline may be helpful, but of course all decisions with regard to medications are deferred to her physician(s). She is following up with her PCP in less than a month and can discuss this further then. Additionally, she may benefit from counseling to assist in treating depression. 4. If the patient experiences any increase in cognitive difficulties over time despite adequate treatment of depression,  neuropsychological re-evaluation could be considered. 5. [name] reporting of visual hallucinations should continue to be monitored. They do not seem to be causing any significant distress at this time. The etiology of these is unclear. They do not seem related to her depression.    Feedback to Patient: [name] and her two daughters returned for a feedback appointment on 09/19/2016 to review the results of her neuropsychological evaluation with this provider. 20 minutes face-to-face time was spent reviewing her test results, my impressions and my recommendations as detailed above.    Total time spent on this patient's case: [id number] unit for interview with psychologist; [phone] units of testing by psychometrician under psychologist's supervision; [phone] units for medical record review, scoring of neuropsychological tests, interpretation of test results, preparation of this report, and review of results to the patient by psychologist.      Thank you for your referral of [name]. Please feel free to contact me if you have any questions or concerns regarding this report.

## 2016-09-19 ENCOUNTER — Encounter: Payer: Self-pay | Admitting: Psychology

## 2016-09-19 ENCOUNTER — Ambulatory Visit (INDEPENDENT_AMBULATORY_CARE_PROVIDER_SITE_OTHER): Payer: PPO | Admitting: Psychology

## 2016-09-19 DIAGNOSIS — I679 Cerebrovascular disease, unspecified: Secondary | ICD-10-CM

## 2016-09-19 DIAGNOSIS — G3184 Mild cognitive impairment, so stated: Secondary | ICD-10-CM

## 2016-09-19 NOTE — Patient Instructions (Signed)
  The effect of depression and anxiety on your cognitive functioning: . One of the typical symptoms of depression is difficulty concentrating and making decisions, and various types of anxiety also interfere with attention and concentration . Problems with attention and concentration can disrupt the process of learning and making new memories, which can make it seem like there is a problem with your memory. In your daily life, you may experience this disruption as forgetting names and appointments, misplacing items, and needing to make lists for shopping and errands. It may be harder for you to stay focused on tasks and feel as "sharp" as you did in the past.  . Also, when we are depressed or anxious, we often pay more attention to our difficulties (rather than our strengths) in our daily life, and this can make it seem to [place] like we are doing worse cognitively than we really are. . The cognitive aspects of depression and anxiety are sometimes observed as an identifiable pattern of poor performance on a neuropsychological evaluation, but it is also possible that all scores on an evaluation are within normal limits. . Regardless of the test scores, distress related to depression and anxiety can interfere with the ability to make use of your cognitive resources and function optimally across settings such as work or school, maintaining the home and responsibilities, and personal relationships. . Fortunately, there are treatments for depression and anxiety, and when mood improves, cognitive functioning in daily life often improves. . Treatment options include psychotherapy, medications (e.g., antidepressants), and behavioral changes, such as increasing your involvement in enjoyable activities, increasing the amount of exercise you are getting, and maintaining a regular routine.

## 2016-09-20 ENCOUNTER — Encounter: Payer: Self-pay | Admitting: Hematology

## 2016-09-20 ENCOUNTER — Ambulatory Visit: Payer: PPO

## 2016-09-20 ENCOUNTER — Other Ambulatory Visit (HOSPITAL_BASED_OUTPATIENT_CLINIC_OR_DEPARTMENT_OTHER): Payer: PPO

## 2016-09-20 ENCOUNTER — Ambulatory Visit (HOSPITAL_BASED_OUTPATIENT_CLINIC_OR_DEPARTMENT_OTHER): Payer: PPO | Admitting: Hematology

## 2016-09-20 VITALS — BP 135/60 | HR 73 | Temp 97.6°F | Resp 18 | Ht 67.0 in

## 2016-09-20 DIAGNOSIS — C50919 Malignant neoplasm of unspecified site of unspecified female breast: Secondary | ICD-10-CM

## 2016-09-20 DIAGNOSIS — C50912 Malignant neoplasm of unspecified site of left female breast: Secondary | ICD-10-CM | POA: Diagnosis not present

## 2016-09-20 DIAGNOSIS — E559 Vitamin D deficiency, unspecified: Secondary | ICD-10-CM

## 2016-09-20 DIAGNOSIS — Z79811 Long term (current) use of aromatase inhibitors: Secondary | ICD-10-CM

## 2016-09-20 DIAGNOSIS — D649 Anemia, unspecified: Secondary | ICD-10-CM | POA: Diagnosis not present

## 2016-09-20 DIAGNOSIS — E875 Hyperkalemia: Secondary | ICD-10-CM

## 2016-09-20 DIAGNOSIS — Z170 Estrogen receptor positive status [ER+]: Secondary | ICD-10-CM | POA: Diagnosis not present

## 2016-09-20 DIAGNOSIS — M8580 Other specified disorders of bone density and structure, unspecified site: Secondary | ICD-10-CM

## 2016-09-20 DIAGNOSIS — C50812 Malignant neoplasm of overlapping sites of left female breast: Secondary | ICD-10-CM

## 2016-09-20 LAB — CBC & DIFF AND RETIC
BASO%: 0.2 % (ref 0.0–2.0)
Basophils Absolute: 0 10*3/uL (ref 0.0–0.1)
EOS%: 3.3 % (ref 0.0–7.0)
Eosinophils Absolute: 0.4 10*3/uL (ref 0.0–0.5)
HCT: 29.1 % — ABNORMAL LOW (ref 34.8–46.6)
HGB: 8.9 g/dL — ABNORMAL LOW (ref 11.6–15.9)
Immature Retic Fract: 17.3 % — ABNORMAL HIGH (ref 1.60–10.00)
LYMPH%: 26.5 % (ref 14.0–49.7)
MCH: 26.6 pg (ref 25.1–34.0)
MCHC: 30.6 g/dL — ABNORMAL LOW (ref 31.5–36.0)
MCV: 87.1 fL (ref 79.5–101.0)
MONO#: 0.6 10*3/uL (ref 0.1–0.9)
MONO%: 5.4 % (ref 0.0–14.0)
NEUT#: 6.9 10*3/uL — ABNORMAL HIGH (ref 1.5–6.5)
NEUT%: 64.6 % (ref 38.4–76.8)
Platelets: 282 10*3/uL (ref 145–400)
RBC: 3.34 10*6/uL — ABNORMAL LOW (ref 3.70–5.45)
RDW: 14.8 % — ABNORMAL HIGH (ref 11.2–14.5)
Retic %: 2.17 % — ABNORMAL HIGH (ref 0.70–2.10)
Retic Ct Abs: 72.48 10*3/uL (ref 33.70–90.70)
WBC: 10.7 10*3/uL — ABNORMAL HIGH (ref 3.9–10.3)
lymph#: 2.8 10*3/uL (ref 0.9–3.3)

## 2016-09-20 LAB — COMPREHENSIVE METABOLIC PANEL
ALT: 12 U/L (ref 0–55)
AST: 11 U/L (ref 5–34)
Albumin: 2.8 g/dL — ABNORMAL LOW (ref 3.5–5.0)
Alkaline Phosphatase: 92 U/L (ref 40–150)
Anion Gap: 9 mEq/L (ref 3–11)
BUN: 24.4 mg/dL (ref 7.0–26.0)
CO2: 24 mEq/L (ref 22–29)
Calcium: 9.1 mg/dL (ref 8.4–10.4)
Chloride: 102 mEq/L (ref 98–109)
Creatinine: 1.4 mg/dL — ABNORMAL HIGH (ref 0.6–1.1)
EGFR: 41 mL/min/{1.73_m2} — ABNORMAL LOW (ref >90)
Glucose: 398 mg/dl — ABNORMAL HIGH (ref 70–140)
Potassium: 5.9 mEq/L — ABNORMAL HIGH (ref 3.5–5.1)
Sodium: 135 mEq/L — ABNORMAL LOW (ref 136–145)
Total Bilirubin: 0.44 mg/dL (ref 0.20–1.20)
Total Protein: 7.5 g/dL (ref 6.4–8.3)

## 2016-09-20 MED ORDER — ANASTROZOLE 1 MG PO TABS: 1.0000 mg | ORAL_TABLET | Freq: Every day | ORAL | 4 refills | Status: DC

## 2016-09-20 MED ORDER — SODIUM POLYSTYRENE SULFONATE 15 GM/60ML PO SUSP: 30.0000 g | Freq: Once | ORAL | 0 refills | Status: AC

## 2016-09-20 NOTE — Progress Notes (Signed)
.    Hematology oncology Clinic FOllowup Note  Date of service .09/20/2016   Patient Care Team: Gildardo Cranker, DO as PCP - General (Internal Medicine) [name], [name], MD (Inactive) as Consulting Physician (Cardiology) [name], MD as Consulting Physician (Ophthalmology) [name], MD as Consulting Physician (General Surgery) [name], MD as Referring Physician (Obstetrics and Gynecology)  CHIEF COMPLAINTS/PURPOSE OF CONSULTATION:  F/u for breast cancer.  DIAGNOSIS: left-sided presumed stage IA  (pT1c,pNx[cN0], cM0) invasive ductal carcinoma grade 2 out of 3, strongly ER +100%, strongly PR +100% and HER-2/neu negative. Given her high risk cardiac status lumpectomy had to be done under local anesthesia and sentinel lymph node biopsy was not possible. She has accompanying DCIS of intermediate grade present at margins. ECOG performance status is 3. -Had a mammogram/tomosynthesis done on 07/29/2015 which shows residual suspicious calcification in the left breast postero-medial to the biopsy site. Has known residual DCIS.  No evidence of new malignancy.  No evidence of right breast malignancy.   CURRENT TREATMENT: Arimidex 55m po daily  HISTORY OF PRESENTING ILLNESS: -please see my initial consultation for details of intial presentation  INTERVAL HISTORY  Mrs. CEspirituis here for her scheduled 6 month followup of her breast cancer.  She notes that she has been compliant with her Arimidex and has not had any prohibitive symptoms. Has not noted any new breast discomfort/skin changes or obvious lumps. She had a follow-up diagnostic mammogram on 08/16/2016 that showed Continued suspicious calcifications posterior to the left lumpectomy site. No other changes.   Blood sugars continue to remain uncontrolled. Patient notes that she is not being able to afford her insulin. She was recommended to her primary care physician about this .  Labs show that she has been developing some  progressive anemia . She is on Coumadin for recurrent PE . No overt evidence of GI bleeding . Has had low B12 levels in the past . Anemia workup was ordered today .  No other acute new symptoms.   MEDICAL HISTORY:  Past Medical History:  Diagnosis Date  . Allergy    takes Mucinex daily as needed  . Anemia, unspecified   . Anxiety    takes Clonazepam daily as needed  . Arthritis   . CHF (congestive heart failure) (HCC)    takes Furosemide daily  . Coronary artery disease    a. s/p IMI 2004 tx with BMS to RCA;  b. s/p Promus DES to RCA 2/12 (cath: LM 20-30%, pLAD 20-30%, mLAD 50%, RI 30%, pRCA 60%, mRCA 90% - tx with PCI);  c. myoview 1/07: EF 49%, inf scar, no isch  . Coronary atherosclerosis of native coronary artery   . Depression    takes Cymbalta daily  . Diabetes mellitus    insulin daily  . Dysuria   . GERD (gastroesophageal reflux disease)    takes Protonix daily  . Gout, unspecified    takes Colchicine daily  . Headache    occasionally  . History of blood clots about 537yrago   in legs-takes Coumadin   . Hyperlipidemia    takes Pravastatin daily  . Hypertension    takes Imdur and Metoprolol daily  . Insomnia   . Joint pain   . Joint swelling   . Myocardial infarction ([place]  . Numbness   . Obstructive sleep apnea   . Osteoarthritis   . Osteoarthritis   . Osteoarthrosis, unspecified whether generalized or localized, lower leg   . Pain, chronic   .  Polymyalgia rheumatica ([place])   . Pulmonary emboli ([place]) 9/13   felt to need lifelong anticoagulation  . Shortness of breath dyspnea    with exertion and has Albuterol inhaler prn  . Type II or unspecified type diabetes mellitus without mention of complication, uncontrolled   . Urinary incontinence    takes Linzess daily  . Vertigo    hx of;was taking Meclizine if needed    SURGICAL HISTORY: Past Surgical History:  Procedure Laterality Date  . ABDOMINAL HYSTERECTOMY     partial  . APPENDECTOMY    .  blood clots/legs and lungs  2013  . BREAST BIOPSY Left 07/22/2014  . BREAST BIOPSY Left 02/10/2013  . BREAST LUMPECTOMY Left 11/05/2014  . BREAST LUMPECTOMY WITH RADIOACTIVE SEED LOCALIZATION Left 11/05/2014   Procedure: LEFT BREAST LUMPECTOMY WITH RADIOACTIVE SEED LOCALIZATION;  Surgeon: [name], MD;  Location: [facility];  Service: General;  Laterality: Left;  . CARDIAC CATHETERIZATION    . COLONOSCOPY    . CORONARY ANGIOPLASTY  2  . EXCISION OF SKIN TAG Right 11/05/2014   Procedure: EXCISION OF RIGHT EYELID SKIN TAG;  Surgeon: [name], MD;  Location: [facility];  Service: General;  Laterality: Right;  . EYE SURGERY Bilateral    cataract   . GASTRIC BYPASS  1977    reversed in 1979, [place] N/A 06/29/2014   Procedure: LEFT HEART CATHETERIZATION WITH CORONARY ANGIOGRAM;  Surgeon: Troy Sine, MD;  Location: [facility] CATH LAB;  Service: Cardiovascular;  Laterality: N/A;  . MI with stent placement  2004    SOCIAL HISTORY: Social History   Social History  . Marital status: Widowed    Spouse name: N/A  . Number of children: N/A  . Years of education: N/A   Occupational History  . retired    Social History Main Topics  . Smoking status: Never Smoker  . Smokeless tobacco: Never Used  . Alcohol use No  . Drug use: No  . Sexual activity: Not Currently   Other Topics Concern  . Not on file   Social History Narrative  . No narrative on file    FAMILY HISTORY: Family History  Problem Relation Age of Onset  . Breast cancer Mother 77  . Heart disease Mother   . Throat cancer Father   . Hypertension Father   . Arthritis Father   . Diabetes Father   . Arthritis Sister   . Obesity Sister   . Diabetes Sister   . Heart disease Cousin     ALLERGIES:  is allergic to sulfonamide derivatives and tramadol.  MEDICATIONS:  Current Outpatient Prescriptions  Medication Sig Dispense Refill  . albuterol (PROVENTIL  HFA;VENTOLIN HFA) 108 (90 Base) MCG/ACT inhaler Inhale 1-2 puffs into the lungs every 6 (six) hours as needed for wheezing or shortness of breath. 1 Inhaler 6  . anastrozole (ARIMIDEX) 1 MG tablet Take 1 tablet (1 mg total) by mouth daily. 90 tablet 4  . budesonide-formoterol (SYMBICORT) 160-4.5 MCG/ACT inhaler Inhale 2 puffs into the lungs 2 (two) times daily as needed (shortness of breath). 1 Inhaler 6  . cetirizine (ZYRTEC) 10 MG tablet Take 10 mg by mouth at bedtime.    . docusate sodium (COLACE) 100 MG capsule Take 100 mg by mouth daily as needed for mild constipation. TAKE PER BOTTLE AS NEEDED FOR CONSTIPATION     . DULoxetine (CYMBALTA) 60 MG capsule TAKE ONE CAPSULE BY MOUTH ONCE DAILY FOR ANXIETY  90 capsule 1  . insulin aspart (NOVOLOG) 100 UNIT/ML injection Inject 30 Units into the skin 3 (three) times daily with meals. 10 vial 6  . Insulin Glargine (TOUJEO SOLOSTAR) 300 UNIT/ML SOPN Inject 70 Units into the skin 2 (two) times daily. (before breakfast and before supper) 10 pen 3  . isosorbide mononitrate (IMDUR) 60 MG 24 hr tablet TAKE ONE TABLET BY MOUTH ONCE DAILY 90 tablet 1  . Lancets (ONETOUCH ULTRASOFT) lancets Check blood sugar 2-3 times daily as directed    . losartan (COZAAR) 25 MG tablet Take 1 tablet (25 mg total) by mouth daily. 30 tablet 1  . meclizine (ANTIVERT) 25 MG tablet Take 1 tablet (25 mg total) by mouth 3 (three) times daily as needed for dizziness. 60 tablet 0  . metoprolol succinate (TOPROL-XL) 25 MG 24 hr tablet TAKE ONE TABLET BY MOUTH ONCE DAILY FOR  BLOOD  PRESSURE 90 tablet 1  . Multiple Vitamins-Minerals (HAIR/SKIN/NAILS/BIOTIN PO) Take 1 tablet by mouth every evening.    . nitroGLYCERIN (NITROSTAT) 0.4 MG SL tablet Take one tablet under the tongue every 5 minutes as needed for chest pain 50 tablet 0  . ONETOUCH VERIO test strip Use to test blood sugar three times daily. Dx: E11.00 100 each 12  . oxyCODONE-acetaminophen (PERCOCET) 10-325 MG tablet Take 1 tablet  by mouth every 6 (six) hours as needed for pain. 120 tablet 0  . pantoprazole (PROTONIX) 40 MG tablet TAKE ONE TABLET BY MOUTH ONCE DAILY 90 tablet 1  . pravastatin (PRAVACHOL) 20 MG tablet TAKE ONE TABLET BY MOUTH ONCE DAILY FOR CHOLESTEROL 30 tablet 1  . sertraline (ZOLOFT) 50 MG tablet Take 1 tablet (50 mg total) by mouth at bedtime. 30 tablet 6  . sodium chloride (OCEAN) 0.65 % SOLN nasal spray RINSE AS NEEDED FOR SINUS CONGESTION    . Spacer/Aero-Holding Chambers (AEROCHAMBER PLUS FLO-VU LARGE) MISC 1 each by Other route once. 1 each 0  . ULORIC 40 MG tablet TAKE ONE TABLET BY MOUTH ONCE DAILY 30 tablet 5  . warfarin (COUMADIN) 5 MG tablet Take 1 & 1/2  tablets daily except 1 tablet on Mondays, Wednesdays, and Fridays 45 tablet 5   No current facility-administered medications for this visit.     REVIEW OF SYSTEMS:   Review of systems as noted above Remaining 10 point review of systems negative except as noted above.  PHYSICAL EXAMINATION: ECOG PERFORMANCE STATUS: 3 - Symptomatic, >50% confined to bed  Vitals:   09/20/16 0950  BP: 135/60  Pulse: 73  Resp: 18  Temp: 97.6 F (36.4 C)   Filed Weights   GENERAL: Pleasant African-American lady, alert, no distress and comfortable, obese. SKIN: skin color, texture, turgor are normal, no rashes or significant lesions EYES: normal, conjunctiva are pink and non-injected, sclera clear OROPHARYNX: no exudate, no erythema and lips, buccal mucosa, and tongue normal  NECK: supple, thyroid normal size, non-tender, without nodularity LYMPH:  no palpable lymphadenopathy in the cervical, axillary or inguinal LUNGS: clear to auscultation normal breathing effort.  HEART: regular rate & rhythm and no murmurs and no lower extremity edema ABDOMEN:abdomen obese, soft, non-tender and normal bowel sounds Musculoskeletal:no cyanosis of digits and no clubbing  PSYCH: alert & oriented x 3 with fluent speech NEURO: no focal motor/sensory  deficits  LABORATORY DATA:  I have reviewed the data as listed  . CBC Latest Ref Rng & Units 09/20/2016 08/11/2016 07/27/2016  WBC 3.9 - 10.3 10e3/uL 10.7(H) 13.5(H) 14.6(H)  Hemoglobin 11.6 -  15.9 g/dL 8.9(L) 9.3(L) 9.9(L)  Hematocrit 34.8 - 46.6 % 29.1(L) 29.7(L) 32.4(L)  Platelets 145 - 400 10e3/uL 282 356 361   . CBC    Component Value Date/Time   WBC 10.7 (H) 09/20/2016 0923   WBC 13.5 (H) 08/11/2016 1130   RBC 3.34 (L) 09/20/2016 0923   RBC 3.52 (L) 08/11/2016 1130   HGB 8.9 (L) 09/20/2016 0923   HCT 29.1 (L) 09/20/2016 0923   PLT 282 09/20/2016 0923   MCV 87.1 09/20/2016 0923   MCH 26.6 09/20/2016 0923   MCH 26.4 (L) 08/11/2016 1130   MCHC 30.6 (L) 09/20/2016 0923   MCHC 31.3 (L) 08/11/2016 1130   RDW 14.8 (H) 09/20/2016 0923   LYMPHSABS 2.8 09/20/2016 0923   MONOABS 0.6 09/20/2016 0923   EOSABS 0.4 09/20/2016 0923   BASOSABS 0.0 09/20/2016 0923    . CMP Latest Ref Rng & Units 09/20/2016 07/27/2016 07/23/2016  Glucose 70 - 140 mg/dl 398(H) 176(H) 161(H)  BUN 7.0 - 26.0 mg/dL 24.4 39(H) 21(H)  Creatinine 0.6 - 1.1 mg/dL 1.4(H) 1.40(H) 1.44(H)  Sodium 136 - 145 mEq/L 135(L) 136 138  Potassium 3.5 - 5.1 mEq/L 5.9 No visable hemolysis(H) 5.5(H) 4.8  Chloride 98 - 110 mmol/L - 102 103  CO2 22 - 29 mEq/L _0 Calcium 8.4 - 10.4 mg/dL 9.1 8.9 8.9  Total Protein 6.4 - 8.3 g/dL 7.5 7.5 -  Total Bilirubin 0.20 - 1.20 mg/dL 0.44 0.3 -  Alkaline Phos 40 - 150 U/L 92 84 -  AST 5 - 34 U/L 11 14 -  ALT 0 - 55 U/L 12 9 -      RADIOGRAPHIC STUDIES: I have personally reviewed the radiological images as listed and agreed with the findings in the report.  CLINICAL DATA:  Annual mammography. The patient had surgery for DCIS in the left breast in 2016. There were positive margins at surgery but the patient could not return for additional surgery. Known suspicious calcifications remain.  EXAM: 2D DIGITAL DIAGNOSTIC BILATERAL MAMMOGRAM WITH CAD AND ADJUNCT  TOMO  COMPARISON:  Previous exam(s).  ACR Breast Density Category b: There are scattered areas of fibroglandular density.  FINDINGS: The suspicious calcifications posterior to the left lumpectomy site are stable. No other interval changes or other suspicious findings.  Mammographic images were processed with CAD.  IMPRESSION: Continued suspicious calcifications posterior to the left lumpectomy site. No other changes.  RECOMMENDATION: Continued annual mammography for surveillance. Continued oncologic follow up.  I have discussed the findings and recommendations with the patient. Results were also provided in writing at the conclusion of the visit. If applicable, a reminder letter will be sent to the patient regarding the next appointment.  BI-RADS CATEGORY  6: Known biopsy-proven malignancy.   Electronically Signed   By: Dorise Bullion III M.D   On: 08/16/2016 15:21   ASSESSMENT & PLAN:   Mrs. Sarkisyan is a very wonderful 82 year old African-American female with multiple medical comorbidities as described above with   #1Left-sided presumed stage IA  (pT1c,pNx[cN0], cM0) invasive ductal carcinoma grade 2 out of 3, strongly ER +100%, strongly PR +100% and HER-2/neu negative. Given her high risk cardiac status lumpectomy had to be done under local anesthesia and sentinel lymph node biopsy was not possible. She has accompanying DCIS of intermediate grade present at margins. ECOG performance status is 3. Dexa scan "low bone mass" per WHO.  #2 significant coronary artery disease with stress test in February 2016 suggesting likely multivessel disease. Has  uncontrolled diabetes, hypertension, dyslipidemia, untreated sleep apnea, coronary disease, severe arthritis all of which are significantly limiting her quality of life. #3 vitamin D deficiency - 25OH vitamin D level of 10, s/p high dose ergocalciferol re-placement with 25OH Vit D improvement to 30.5.  Now on vit D 2000  units daily.   Plan -no clear clinical indication for breast cancer progression at this time. Mammogram done on 08/16/2016 appear stable. -No prohibitive toxicities from the Arimidex at this time. -continue Arimidex 66m po daily - refill sent. -continue vit D 2000 IU daily  -atleast 1200-15036mpo calcium intake daily -rpt DEXA scan in 01/2017 for bone health monitoirng on Arimidex.  #4 Worsening Anemia ? Slow GI losses . Multiple metabolic insults in the bone marrow including uncontrolled diabetes .patient notes that her fasting glucose and so and in the 300s . Plan  -We will send out a ferritin, iron profile, B12, RBC folate, myeloma panel today to work her anemia up . -She was recommended to follow-up with her primary care physician to optimize her diabetes management .  #5 Hyperkalemia - potassium 5.9 . Likely related to metabolic derangements from her diabetes . Cannot rule out the possibility of type IV RTA related to diabetes . Also has some element of chronic kidney disease and is on ARB . Plan  -Optimize diabetes management with adequate insulin therapy . That a control of her hyperglycemia will probably drive down the potassium with adequate insulin use . -Maintain good oral hydration with at least 48-64 ounces of water daily . -Called and discussed with primary care physician regarding continued use of ARB . -We will need repeat check of her potassium levels in 3-5 days . -We will send 1 dose of Kayexalate to her pharmacy .  Additional labs today DEXA scan in 5 months RTC with Dr KaIrene Limbon 6 months with labs  Continue follow-up with your primary care physician Dr. CaEulas Postor other ongoing cares.  GaSullivan LoneD MSMuirematology/Oncology Physician CoUniversity Of South Alabama Medical Center(Office):       [phone] cell):  [phone]):           [phone]

## 2016-09-21 ENCOUNTER — Other Ambulatory Visit: Payer: Self-pay | Admitting: Internal Medicine

## 2016-09-21 ENCOUNTER — Telehealth: Payer: Self-pay | Admitting: *Deleted

## 2016-09-21 NOTE — Telephone Encounter (Signed)
Err

## 2016-09-22 ENCOUNTER — Other Ambulatory Visit: Payer: Self-pay | Admitting: *Deleted

## 2016-09-22 ENCOUNTER — Telehealth: Payer: Self-pay | Admitting: *Deleted

## 2016-09-22 DIAGNOSIS — E1121 Type 2 diabetes mellitus with diabetic nephropathy: Secondary | ICD-10-CM

## 2016-09-22 DIAGNOSIS — IMO0002 Reserved for concepts with insufficient information to code with codable children: Secondary | ICD-10-CM

## 2016-09-22 DIAGNOSIS — E1165 Type 2 diabetes mellitus with hyperglycemia: Principal | ICD-10-CM

## 2016-09-22 DIAGNOSIS — Z794 Long term (current) use of insulin: Principal | ICD-10-CM

## 2016-09-22 NOTE — Telephone Encounter (Signed)
Received prior authorization through CoverMyMeds for patient's Uloric Key: [id number]. Initiated and went into clinical determination 48-72 hours.  Member ID: [id number] Group: [id number] PA#: [id number]

## 2016-09-25 ENCOUNTER — Other Ambulatory Visit: Payer: PPO

## 2016-09-25 ENCOUNTER — Ambulatory Visit: Payer: PPO | Admitting: Pharmacotherapy

## 2016-09-25 ENCOUNTER — Telehealth: Payer: Self-pay | Admitting: *Deleted

## 2016-09-25 ENCOUNTER — Other Ambulatory Visit: Payer: Self-pay

## 2016-09-25 DIAGNOSIS — N183 Chronic kidney disease, stage 3 unspecified: Secondary | ICD-10-CM

## 2016-09-25 DIAGNOSIS — Z7901 Long term (current) use of anticoagulants: Secondary | ICD-10-CM

## 2016-09-25 DIAGNOSIS — E875 Hyperkalemia: Secondary | ICD-10-CM

## 2016-09-25 DIAGNOSIS — I2782 Chronic pulmonary embolism: Secondary | ICD-10-CM

## 2016-09-25 LAB — PROTIME-INR
INR: 3.2 — ABNORMAL HIGH
Prothrombin Time: 32.6 s — ABNORMAL HIGH (ref 9.0–11.5)

## 2016-09-25 MED ORDER — SODIUM POLYSTYRENE SULFONATE 15 GM/60ML PO SUSP: 15.0000 g | Freq: Once | ORAL | 0 refills | Status: AC

## 2016-09-25 NOTE — Addendum Note (Signed)
Addended by: Sullivan Lone on: 09/25/2016 10:07 AM   Modules accepted: Orders

## 2016-09-25 NOTE — Telephone Encounter (Signed)
SW pt regarding labs from 5/16.  Informed pt potassium was elevated, rx for kayexalate sent to pt pharmacy.  Also informed pt that glucose was elevated, pt states she checks glucose at home.  Pt instructed to f/u with PCP, pt stated she had an apt today with PCP.  Pt verbalized understanding of instructions.

## 2016-09-25 NOTE — Telephone Encounter (Signed)
Received fax from [facility] stating Uloric 40mg  has been APPROVED from 09/22/16 until 05/07/2017

## 2016-09-26 LAB — BASIC METABOLIC PANEL WITH GFR
BUN: 19 mg/dL (ref 7–25)
CO2: 23 mmol/L (ref 20–31)
Calcium: 8.8 mg/dL (ref 8.6–10.4)
Chloride: 99 mmol/L (ref 98–110)
Creat: 1.31 mg/dL — ABNORMAL HIGH (ref 0.60–0.93)
GFR, Est African American: 45 mL/min — ABNORMAL LOW (ref >=60)
GFR, Est Non African American: 39 mL/min — ABNORMAL LOW (ref >=60)
Glucose, Bld: 285 mg/dL — ABNORMAL HIGH (ref 65–99)
Potassium: 4.9 mmol/L (ref 3.5–5.3)
Sodium: 135 mmol/L (ref 135–146)

## 2016-10-09 ENCOUNTER — Ambulatory Visit (INDEPENDENT_AMBULATORY_CARE_PROVIDER_SITE_OTHER): Payer: PPO | Admitting: Pharmacotherapy

## 2016-10-09 ENCOUNTER — Encounter: Payer: Self-pay | Admitting: Pharmacotherapy

## 2016-10-09 VITALS — BP 140/84 | HR 70 | Temp 97.4°F | Ht 67.0 in | Wt 333.0 lb

## 2016-10-09 DIAGNOSIS — G894 Chronic pain syndrome: Secondary | ICD-10-CM

## 2016-10-09 DIAGNOSIS — E1121 Type 2 diabetes mellitus with diabetic nephropathy: Secondary | ICD-10-CM | POA: Diagnosis not present

## 2016-10-09 DIAGNOSIS — Z7901 Long term (current) use of anticoagulants: Secondary | ICD-10-CM

## 2016-10-09 DIAGNOSIS — E1165 Type 2 diabetes mellitus with hyperglycemia: Secondary | ICD-10-CM | POA: Diagnosis not present

## 2016-10-09 DIAGNOSIS — IMO0002 Reserved for concepts with insufficient information to code with codable children: Secondary | ICD-10-CM

## 2016-10-09 DIAGNOSIS — Z794 Long term (current) use of insulin: Secondary | ICD-10-CM

## 2016-10-09 DIAGNOSIS — I2782 Chronic pulmonary embolism: Secondary | ICD-10-CM | POA: Diagnosis not present

## 2016-10-09 LAB — POCT INR: INR: 3.1

## 2016-10-09 MED ORDER — INSULIN GLARGINE 300 UNIT/ML ~~LOC~~ SOPN: 70.0000 [IU] | PEN_INJECTOR | Freq: Two times a day (BID) | SUBCUTANEOUS | 3 refills | Status: DC

## 2016-10-09 MED ORDER — OXYCODONE-ACETAMINOPHEN 10-325 MG PO TABS: 1.0000 | ORAL_TABLET | Freq: Four times a day (QID) | ORAL | 0 refills | Status: DC | PRN

## 2016-10-09 MED ORDER — WARFARIN SODIUM 5 MG PO TABS: ORAL_TABLET | ORAL | 3 refills | Status: DC

## 2016-10-09 NOTE — Progress Notes (Signed)
   Subjective:    Patient ID: [name], female    DOB: 1934/07/14, 82 y.o.   MRN: [id number]  HPI Last INR on 09/25/16 was high at 3.2 Some confusion over Coumadin dose Daughter confirms dose is 5mg  QD except 7.5mg  T/Th Denies missed doses. Denies CP, palpitations, falls Denies unusual bleeding or bruising. Consistent with vitamin K foods   Review of Systems  HENT: Negative for nosebleeds.   Cardiovascular: Negative for chest pain and palpitations.  Gastrointestinal: Negative for anal bleeding and blood in stool.  Genitourinary: Negative for hematuria.  Hematological: Does not bruise/bleed easily.       Objective:   Physical Exam  Constitutional: She is oriented to person, place, and time. She appears well-developed and well-nourished.  HENT:  Right Ear: External ear normal.  Left Ear: External ear normal.  Cardiovascular: Normal rate, regular rhythm and normal heart sounds.   Pulmonary/Chest: Effort normal and breath sounds normal.  Neurological: She is alert and oriented to person, place, and time.  Skin: Skin is warm and dry.  Psychiatric: She has a normal mood and affect. Her behavior is normal. Judgment and thought content normal.  Vitals reviewed.   BP: 140/74  HR: 70  Wt: 333lb INR 3.1     Assessment & Plan:  1.  INR at goal 2-3 2.  Continue Coumadin 5mg  QD except 7.5mg  T/Th 3.  INR 1 month

## 2016-10-09 NOTE — Addendum Note (Signed)
Addended by: Ripley Fraise on: 10/09/2016 12:52 PM   Modules accepted: Orders

## 2016-10-09 NOTE — Patient Instructions (Signed)
INR 3.1  Continue Coumadin 55m daily except 7.538m(1 & 1/2 tablets) on Tuesdays or Thursdays

## 2016-10-10 ENCOUNTER — Other Ambulatory Visit: Payer: Self-pay | Admitting: Internal Medicine

## 2016-10-10 NOTE — Telephone Encounter (Signed)
rx sent to pharmacy by e-script  

## 2016-10-13 ENCOUNTER — Encounter: Payer: Self-pay | Admitting: Internal Medicine

## 2016-10-13 ENCOUNTER — Ambulatory Visit (INDEPENDENT_AMBULATORY_CARE_PROVIDER_SITE_OTHER): Payer: PPO | Admitting: Internal Medicine

## 2016-10-13 VITALS — BP 124/78 | HR 71 | Temp 97.8°F | Ht 67.0 in | Wt 333.0 lb

## 2016-10-13 DIAGNOSIS — J410 Simple chronic bronchitis: Secondary | ICD-10-CM

## 2016-10-13 DIAGNOSIS — E1121 Type 2 diabetes mellitus with diabetic nephropathy: Secondary | ICD-10-CM | POA: Diagnosis not present

## 2016-10-13 DIAGNOSIS — E1165 Type 2 diabetes mellitus with hyperglycemia: Secondary | ICD-10-CM

## 2016-10-13 DIAGNOSIS — IMO0002 Reserved for concepts with insufficient information to code with codable children: Secondary | ICD-10-CM

## 2016-10-13 DIAGNOSIS — R413 Other amnesia: Secondary | ICD-10-CM | POA: Diagnosis not present

## 2016-10-13 DIAGNOSIS — K2100 Gastro-esophageal reflux disease with esophagitis, without bleeding: Secondary | ICD-10-CM

## 2016-10-13 DIAGNOSIS — Z794 Long term (current) use of insulin: Secondary | ICD-10-CM

## 2016-10-13 DIAGNOSIS — I10 Essential (primary) hypertension: Secondary | ICD-10-CM | POA: Diagnosis not present

## 2016-10-13 DIAGNOSIS — F332 Major depressive disorder, recurrent severe without psychotic features: Secondary | ICD-10-CM | POA: Diagnosis not present

## 2016-10-13 DIAGNOSIS — E875 Hyperkalemia: Secondary | ICD-10-CM | POA: Diagnosis not present

## 2016-10-13 DIAGNOSIS — R1310 Dysphagia, unspecified: Secondary | ICD-10-CM | POA: Diagnosis not present

## 2016-10-13 MED ORDER — BUDESONIDE-FORMOTEROL FUMARATE 160-4.5 MCG/ACT IN AERO: 2.0000 | INHALATION_SPRAY | Freq: Two times a day (BID) | RESPIRATORY_TRACT | 6 refills | Status: DC

## 2016-10-13 MED ORDER — SERTRALINE HCL 50 MG PO TABS: 50.0000 mg | ORAL_TABLET | Freq: Every day | ORAL | 6 refills | Status: DC

## 2016-10-13 NOTE — Progress Notes (Signed)
Patient ID: [name], female   DOB: Oct 27, 1934, 82 y.o.   MRN: [id number]    Location:  [name] Place of Service: OFFICE  Chief Complaint  Patient presents with  . Medical Management of Chronic Issues    one month routine visit . Here with grandson [name]    HPI:  82 yo female seen today for f/u. Cough has  Not improved since stopping ACEI. Cough worse in the AM and she has green sputum pdtn. She has chronic bronchitis and takes prn symbicort and HFA. She has increased frequency/urgency of urination. She is a poor historian due to memory loss. Hx obtained from grandson, [name]  Gout - stable on uloric. No recent flares.  CAD/hyperlipidemia/HTN - stable on imdur, metoprolol. Has not needed SL NTG. Takes lasix prn swelling  Chronic bronchitis - reports SOB and not able to move around as much. She uses HFA prn and symbicort prn.   PE - on coumadin tx. No bleeding. Last INR 3.1. No bleeding. GOAL INR 2-3. Followed by Tivis Ringer  Chronic pain syndrome/PMR/OA - stable overall on percocet. She reports increased pain in her chest wall that radiates from left breast to right. She is taking cymbalta also  Insomnia/depression/anxiety - not sleeping well even with medication restoril '30mg'$ . She gets about 4 hrs per night which is an improvement. She takes klonopin and sertraline but unsure if she is taking sertraline '50mg'$  qhs. Takes cymbalta also  GERD - stable on protonix  DM - uncontrolled. A1c 8.6%. BS at home in 300s. No low BS reactions. She has been off Toujeo for some time due to cost. She is making healthy food choices. She injects 70 units (instead of 60 due to cost of med) Toujeo BID. She has not completed Toujeo Rx assistance forms yet. She takes 30 units humalog BID at least. She does not eat 3 full meals per day but is getting 2 per day. She c/o intermittent pain with numbness/tingling in hands and feet. She is unable to grasp and hold objects in right hand.   Breast CA - stable;  takes arimidex daily. Followed by oncology  vaginal d/c and itching - improved after topical/intravaginal tx. brown d/c. Has foul smelling urine and urine is brown colored. No dysuria, hematuria, urinary frequency/urgency. 1 episode of difficulty starting urine stream but none since. No abdominal pain, N/V, f/c. She completed 7 days of cipro and diflucan tx which helped. She has not seen gyn in several mos and was supposed to have d&c for endometrial polyp but never followed through. She was seeing [name]  She is a poor historian due to memory issues. Hx obtained from daughter.    Past Medical History:  Diagnosis Date  . Allergy    takes Mucinex daily as needed  . Anemia, unspecified   . Anxiety    takes Clonazepam daily as needed  . Arthritis   . CHF (congestive heart failure) (HCC)    takes Furosemide daily  . Coronary artery disease    a. s/p IMI 2004 tx with BMS to RCA;  b. s/p Promus DES to RCA 2/12 (cath: LM 20-30%, pLAD 20-30%, mLAD 50%, RI 30%, pRCA 60%, mRCA 90% - tx with PCI);  c. myoview 1/07: EF 49%, inf scar, no isch  . Coronary atherosclerosis of native coronary artery   . Depression    takes Cymbalta daily  . Diabetes mellitus    insulin daily  . Dysuria   . GERD (gastroesophageal reflux disease)  takes Protonix daily  . Gout, unspecified    takes Colchicine daily  . Headache    occasionally  . History of blood clots about 1yr ago   in legs-takes Coumadin   . Hyperlipidemia    takes Pravastatin daily  . Hypertension    takes Imdur and Metoprolol daily  . Insomnia   . Joint pain   . Joint swelling   . Myocardial infarction ([place]  . Numbness   . Obstructive sleep apnea   . Osteoarthritis   . Osteoarthritis   . Osteoarthrosis, unspecified whether generalized or localized, lower leg   . Pain, chronic   . [place]   . Pulmonary emboli ([place] 9/13   felt to need lifelong anticoagulation  . Shortness of breath dyspnea    with  exertion and has Albuterol inhaler prn  . Type II or unspecified type diabetes mellitus without mention of complication, uncontrolled   . Urinary incontinence    takes Linzess daily  . Vertigo    hx of;was taking Meclizine if needed    Past Surgical History:  Procedure Laterality Date  . ABDOMINAL HYSTERECTOMY     partial  . APPENDECTOMY    . blood clots/legs and lungs  2013  . BREAST BIOPSY Left 07/22/2014  . BREAST BIOPSY Left 02/10/2013  . BREAST LUMPECTOMY Left 11/05/2014  . BREAST LUMPECTOMY WITH RADIOACTIVE SEED LOCALIZATION Left 11/05/2014   Procedure: LEFT BREAST LUMPECTOMY WITH RADIOACTIVE SEED LOCALIZATION;  Surgeon: DCoralie Keens MD;  [facility]  Service: General;  Laterality: Left;  . CARDIAC CATHETERIZATION    . COLONOSCOPY    . CORONARY ANGIOPLASTY  2  . EXCISION OF SKIN TAG Right 11/05/2014   Procedure: EXCISION OF RIGHT EYELID SKIN TAG;  Surgeon: DCoralie Keens MD;  [place] [facility]  Service: General;  Laterality: Right;  . EYE SURGERY Bilateral    cataract   . GASTRIC BYPASS  1977    [place] 06[place]   Procedure: LEFT HEART CATHETERIZATION WITH CORONARY ANGIOGRAM;  Surgeon: TTroy Sine MD;  [facility];  Service: Cardiovascular;  Laterality: N/A;  . MI with stent placement  2004    Patient Care Team: CGildardo Cranker DO as PCP - General (Internal Medicine) WVerl Blalock TMarijo Conception MD (Inactive) as Consulting Physician (Cardiology) GClent Jacks MD as Consulting Physician (Ophthalmology) BCoralie Keens MD as Consulting Physician (General Surgery) RGus Height MD as Referring Physician (Obstetrics and Gynecology)  Social History   Social History  . Marital status: Widowed    Spouse name: N/A  . Number of children: N/A  . Years of education: N/A   Occupational History  . retired    Social History Main Topics  . Smoking status: Never Smoker  .  Smokeless tobacco: Never Used  . Alcohol use No  . Drug use: No  . Sexual activity: Not Currently   Other Topics Concern  . Not on file   Social History Narrative  . No narrative on file     reports that she has never smoked. She has never used smokeless tobacco. She reports that she does not drink alcohol or use drugs.  Family History  Problem Relation Age of Onset  . Breast cancer Mother 650 . Heart disease Mother   . Throat cancer Father   . Hypertension Father   . Arthritis Father   . Diabetes Father   . Arthritis Sister   .  Obesity Sister   . Diabetes Sister   . Heart disease Cousin    Family Status  Relation Status  . Mother Deceased at age 6       Cause of Death: Breast cancer  . Father Deceased at age 60       Cause of Death: Complications of diabetes & HTN  . Sister Deceased at age 69  . Sister Alive  . Daughter Alive  . Son Alive  . Sister Alive  . Sister Alive  . Brother Alive  . Daughter Alive  . MGM Deceased  . MGF Deceased  . PGM Deceased  . PGF Deceased  . Cousin Deceased     Allergies  Allergen Reactions  . Sulfonamide Derivatives Swelling    Mouth swelling  . Tramadol Nausea And Vomiting    Medications: Patient's Medications  New Prescriptions   No medications on file  Previous Medications   ALBUTEROL (PROVENTIL HFA;VENTOLIN HFA) 108 (90 BASE) MCG/ACT INHALER    Inhale 1-2 puffs into the lungs every 6 (six) hours as needed for wheezing or shortness of breath.   ANASTROZOLE (ARIMIDEX) 1 MG TABLET    Take 1 tablet (1 mg total) by mouth daily.   BUDESONIDE-FORMOTEROL (SYMBICORT) 160-4.5 MCG/ACT INHALER    Inhale 2 puffs into the lungs 2 (two) times daily as needed (shortness of breath).   CETIRIZINE (ZYRTEC) 10 MG TABLET    Take 10 mg by mouth at bedtime.   DOCUSATE SODIUM (COLACE) 100 MG CAPSULE    Take 100 mg by mouth daily as needed for mild constipation. TAKE PER BOTTLE AS NEEDED FOR CONSTIPATION    DULOXETINE (CYMBALTA) 60 MG  CAPSULE    TAKE ONE CAPSULE BY MOUTH ONCE DAILY FOR ANXIETY   INSULIN ASPART (NOVOLOG) 100 UNIT/ML INJECTION    Inject 30 Units into the skin 3 (three) times daily with meals.   INSULIN GLARGINE (TOUJEO SOLOSTAR) 300 UNIT/ML SOPN    Inject 70 Units into the skin 2 (two) times daily. (before breakfast and before supper)   ISOSORBIDE MONONITRATE (IMDUR) 60 MG 24 HR TABLET    TAKE ONE TABLET BY MOUTH ONCE DAILY   LANCETS (ONETOUCH ULTRASOFT) LANCETS    Check blood sugar 2-3 times daily as directed   LOSARTAN (COZAAR) 25 MG TABLET    Take 1 tablet (25 mg total) by mouth daily.   MECLIZINE (ANTIVERT) 25 MG TABLET    Take 1 tablet (25 mg total) by mouth 3 (three) times daily as needed for dizziness.   METOPROLOL SUCCINATE (TOPROL-XL) 25 MG 24 HR TABLET    TAKE ONE TABLET BY MOUTH ONCE DAILY FOR BLOOD PRESSURE   MULTIPLE VITAMINS-MINERALS (HAIR/SKIN/NAILS/BIOTIN PO)    Take 1 tablet by mouth every evening.   NITROGLYCERIN (NITROSTAT) 0.4 MG SL TABLET    Take one tablet under the tongue every 5 minutes as needed for chest pain   ONETOUCH VERIO TEST STRIP    Use to test blood sugar three times daily. Dx: E11.00   OXYCODONE-ACETAMINOPHEN (PERCOCET) 10-325 MG TABLET    Take 1 tablet by mouth every 6 (six) hours as needed for pain.   PANTOPRAZOLE (PROTONIX) 40 MG TABLET    TAKE ONE TABLET BY MOUTH ONCE DAILY   PRAVASTATIN (PRAVACHOL) 20 MG TABLET    TAKE 1 TABLET BY MOUTH ONCE DAILY FOR CHOLESTEROL   SERTRALINE (ZOLOFT) 50 MG TABLET    Take 1 tablet (50 mg total) by mouth at bedtime.   SODIUM CHLORIDE (OCEAN) 0.65 %  SOLN NASAL SPRAY    RINSE AS NEEDED FOR SINUS CONGESTION   SPACER/AERO-HOLDING CHAMBERS (AEROCHAMBER PLUS FLO-VU LARGE) MISC    1 each by Other route once.   ULORIC 40 MG TABLET    TAKE ONE TABLET BY MOUTH ONCE DAILY   WARFARIN (COUMADIN) 5 MG TABLET    Take 1 tablet daily except 1 & 1/2 tablets on Tuesdays and Thursdays  Modified Medications   No medications on file  Discontinued Medications    No medications on file    Review of Systems  Vitals:   10/13/16 1120  BP: 124/78  Pulse: 71  Temp: 97.8 F (36.6 C)  TempSrc: Oral  SpO2: 96%  Weight: (!) 333 lb (151 kg)  Height: _0  (1.702 m)   Body mass index is 52.16 kg/m.  Physical Exam  Constitutional: She appears well-developed and well-nourished.  HENT:  Mouth/Throat: Oropharynx is clear and moist. No oropharyngeal exudate.  Eyes: Lids are normal. Pupils are equal, round, and reactive to light. Right eye exhibits no discharge. Left eye exhibits no discharge. No scleral icterus.  Neck: Neck supple. Carotid bruit is not present. No tracheal deviation present. No thyromegaly present.  Cardiovascular: Normal rate, regular rhythm and intact distal pulses.  Exam reveals no gallop and no friction rub.   Murmur (1/6 SEM) heard. No LE edema b/l. no calf TTP.   Pulmonary/Chest: Effort normal. No stridor. No respiratory distress. She has no wheezes. She has no rales. She exhibits no tenderness.  Prolonged expiratory phase  Abdominal: Soft. Bowel sounds are normal. She exhibits no distension and no mass. There is no hepatomegaly. There is no tenderness. There is no rebound and no guarding.  obese  Musculoskeletal: She exhibits edema and tenderness.  Lymphadenopathy:    She has no cervical adenopathy.  Neurological: She is alert.  Skin: Skin is warm and dry. No rash noted.  Psychiatric: Her behavior is normal. She exhibits a depressed mood.     Labs reviewed: Office Visit on 10/09/2016  Component Date Value Ref Range Status  . INR 10/09/2016 3.1   Final  Lab on 09/25/2016  Component Date Value Ref Range Status  . Prothrombin Time 09/25/2016 32.6* 9.0 - 11.5 sec Final   Comment:   For more information on this test, go to: http://education.questdiagnostics.com/faq/FAQ104     . INR 09/25/2016 3.2*  Final   Comment:   Reference Range                        0.9-1.1 Moderate-intensity Warfarin Therapy     2.0-3.0 Higher-intensity Warfarin Therapy      3.0-4.0     . Sodium 09/25/2016 135  135 - 146 mmol/L Final  . Potassium 09/25/2016 4.9  3.5 - 5.3 mmol/L Final  . Chloride 09/25/2016 99  98 - 110 mmol/L Final  . CO2 09/25/2016 23  20 - 31 mmol/L Final  . Glucose, Bld 09/25/2016 285* 65 - 99 mg/dL Final  . BUN 09/25/2016 19  7 - 25 mg/dL Final  . Creat 09/25/2016 1.31* 0.60 - 0.93 mg/dL Final   Comment:   For patients > or = 80 years of age: The upper reference limit for Creatinine is approximately 13% higher for people identified as African-American.     . Calcium 09/25/2016 8.8  8.6 - 10.4 mg/dL Final  . GFR, Est African American 09/25/2016 45* >=60 mL/min Final  . GFR, Est Non African American 09/25/2016 39* >=60 mL/min Final  Appointment on 09/20/2016  Component Date Value Ref Range Status  . WBC 09/20/2016 10.7* 3.9 - 10.3 10e3/uL Final  . NEUT# 09/20/2016 6.9* 1.5 - 6.5 10e3/uL Final  . HGB 09/20/2016 8.9* 11.6 - 15.9 g/dL Final  . HCT 09/20/2016 29.1* 34.8 - 46.6 % Final  . Platelets 09/20/2016 282  145 - 400 10e3/uL Final  . MCV 09/20/2016 87.1  79.5 - 101.0 fL Final  . MCH 09/20/2016 26.6  25.1 - 34.0 pg Final  . MCHC 09/20/2016 30.6* 31.5 - 36.0 g/dL Final  . RBC 09/20/2016 3.34* 3.70 - 5.45 10e6/uL Final  . RDW 09/20/2016 14.8* 11.2 - 14.5 % Final  . lymph# 09/20/2016 2.8  0.9 - 3.3 10e3/uL Final  . MONO# 09/20/2016 0.6  0.1 - 0.9 10e3/uL Final  . Eosinophils Absolute 09/20/2016 0.4  0.0 - 0.5 10e3/uL Final  . Basophils Absolute 09/20/2016 0.0  0.0 - 0.1 10e3/uL Final  . NEUT% 09/20/2016 64.6  38.4 - 76.8 % Final  . LYMPH% 09/20/2016 26.5  14.0 - 49.7 % Final  . MONO% 09/20/2016 5.4  0.0 - 14.0 % Final  . EOS% 09/20/2016 3.3  0.0 - 7.0 % Final  . BASO% 09/20/2016 0.2  0.0 - 2.0 % Final  . Retic % 09/20/2016 2.17* 0.70 - 2.10 % Final  . Retic Ct Abs 09/20/2016 72.48  33.70 - 90.70 10e3/uL Final  . Immature Retic Fract 09/20/2016 17.30* 1.60 - 10.00 % Final  .  Sodium 09/20/2016 135* 136 - 145 mEq/L Final  . Potassium 09/20/2016 5.9 No visable hemolysis* 3.5 - 5.1 mEq/L Final  . Chloride 09/20/2016 102  98 - 109 mEq/L Final  . CO2 09/20/2016 24  22 - 29 mEq/L Final  . Glucose 09/20/2016 398* 70 - 140 mg/dl Final   Glucose reference range is for nonfasting patients. Fasting glucose reference range is 70- 100.  [name] BUN 09/20/2016 24.4  7.0 - 26.0 mg/dL Final  . Creatinine 09/20/2016 1.4* 0.6 - 1.1 mg/dL Final  . Total Bilirubin 09/20/2016 0.44  0.20 - 1.20 mg/dL Final  . Alkaline Phosphatase 09/20/2016 92  40 - 150 U/L Final  . AST 09/20/2016 11  5 - 34 U/L Final  . ALT 09/20/2016 12  0 - 55 U/L Final  . Total Protein 09/20/2016 7.5  6.4 - 8.3 g/dL Final  . Albumin 09/20/2016 2.8* 3.5 - 5.0 g/dL Final  . Calcium 09/20/2016 9.1  8.4 - 10.4 mg/dL Final  . Anion Gap 09/20/2016 9  3 - 11 mEq/L Final  . EGFR 09/20/2016 41* >90 ml/min/1.73 m2 Final   eGFR is calculated using the CKD-EPI Creatinine Equation (2009)  Office Visit on 09/11/2016  Component Date Value Ref Range Status  . INR 09/11/2016 4.6   Final  Office Visit on 08/14/2016  Component Date Value Ref Range Status  . INR 08/14/2016 2.8   Final  Appointment on 08/11/2016  Component Date Value Ref Range Status  . WBC 08/11/2016 13.5* 3.8 - 10.8 K/uL Final  . RBC 08/11/2016 3.52* 3.80 - 5.10 MIL/uL Final  . Hemoglobin 08/11/2016 9.3* 11.7 - 15.5 g/dL Final  . HCT 08/11/2016 29.7* 35.0 - 45.0 % Final  . MCV 08/11/2016 84.4  80.0 - 100.0 fL Final  . MCH 08/11/2016 26.4* 27.0 - 33.0 pg Final  . MCHC 08/11/2016 31.3* 32.0 - 36.0 g/dL Final  . RDW 08/11/2016 15.3* 11.0 - 15.0 % Final  . Platelets 08/11/2016 356  140 - 400 K/uL Final  . MPV  08/11/2016 10.5  7.5 - 12.5 fL Final  . Neutro Abs 08/11/2016 8910* 1,500 - 7,800 cells/uL Final  . Lymphs Abs 08/11/2016 3510  850 - 3,900 cells/uL Final  . Monocytes Absolute 08/11/2016 810  200 - 950 cells/uL Final  . Eosinophils Absolute 08/11/2016 270   15 - 500 cells/uL Final  . Basophils Absolute 08/11/2016 0  0 - 200 cells/uL Final  . Neutrophils Relative % 08/11/2016 66  % Final  . Lymphocytes Relative 08/11/2016 26  % Final  . Monocytes Relative 08/11/2016 6  % Final  . Eosinophils Relative 08/11/2016 2  % Final  . Basophils Relative 08/11/2016 0  % Final  . Smear Review 08/11/2016 Criteria for review not met   Final  Lab on 08/02/2016  Component Date Value Ref Range Status  . Vitamin B-12 08/02/2016 337  211 - 911 pg/mL Final  . TSH 08/02/2016 1.52  0.35 - 4.50 uIU/mL Final  Appointment on 08/01/2016  Component Date Value Ref Range Status  . Culture 08/01/2016 KLEBSIELLA PNEUMONIAE   Final   SOURCE: URINE&URINE  . Colony Count 08/01/2016 Greater than 100,000 CFU/mL   Final  . Organism ID, Bacteria 08/01/2016 KLEBSIELLA PNEUMONIAE   Final  Office Visit on 07/27/2016  Component Date Value Ref Range Status  . Sodium 07/27/2016 136  135 - 146 mmol/L Final  . Potassium 07/27/2016 5.5* 3.5 - 5.3 mmol/L Final   No visible hemolysis.  . Chloride 07/27/2016 102  98 - 110 mmol/L Final  . CO2 07/27/2016 26  20 - 31 mmol/L Final  . Glucose, Bld 07/27/2016 176* 65 - 99 mg/dL Final  . BUN 07/27/2016 39* 7 - 25 mg/dL Final  . Creat 07/27/2016 1.40* 0.60 - 0.93 mg/dL Final   Comment:   For patients > or = 80 years of age: The upper reference limit for Creatinine is approximately 13% higher for people identified as African-American.     . Total Bilirubin 07/27/2016 0.3  0.2 - 1.2 mg/dL Final  . Alkaline Phosphatase 07/27/2016 84  33 - 130 U/L Final  . AST 07/27/2016 14  10 - 35 U/L Final  . ALT 07/27/2016 9  6 - 29 U/L Final  . Total Protein 07/27/2016 7.5  6.1 - 8.1 g/dL Final  . Albumin 07/27/2016 3.3* 3.6 - 5.1 g/dL Final  . Calcium 07/27/2016 8.9  8.6 - 10.4 mg/dL Final  . GFR, Est African American 07/27/2016 41* >=60 mL/min Final  . GFR, Est Non African American 07/27/2016 36* >=60 mL/min Final  . WBC 07/27/2016 14.6* 3.8 - 10.8  K/uL Final  . RBC 07/27/2016 3.83  3.80 - 5.10 MIL/uL Final  . Hemoglobin 07/27/2016 9.9* 11.7 - 15.5 g/dL Final  . HCT 07/27/2016 32.4* 35.0 - 45.0 % Final  . MCV 07/27/2016 84.6  80.0 - 100.0 fL Final  . MCH 07/27/2016 25.8* 27.0 - 33.0 pg Final  . MCHC 07/27/2016 30.6* 32.0 - 36.0 g/dL Final  . RDW 07/27/2016 15.5* 11.0 - 15.0 % Final  . Platelets 07/27/2016 361  140 - 400 K/uL Final  . MPV 07/27/2016 10.1  7.5 - 12.5 fL Final  . Neutro Abs 07/27/2016 10658* 1,500 - 7,800 cells/uL Final  . Lymphs Abs 07/27/2016 3066  850 - 3,900 cells/uL Final  . Monocytes Absolute 07/27/2016 730  200 - 950 cells/uL Final  . Eosinophils Absolute 07/27/2016 146  15 - 500 cells/uL Final  . Basophils Absolute 07/27/2016 0  0 - 200 cells/uL Final  . Neutrophils  Relative % 07/27/2016 73  % Final  . Lymphocytes Relative 07/27/2016 21  % Final  . Monocytes Relative 07/27/2016 5  % Final  . Eosinophils Relative 07/27/2016 1  % Final  . Basophils Relative 07/27/2016 0  % Final  . Smear Review 07/27/2016 Criteria for review not met   Final  . Amphetamines 07/27/2016 NEGATIVE  <10 ng/mL Final  . Amphetamine 07/27/2016 Not Tested   Final  .  Methamphetamine 07/27/2016 Not Tested   Final  . Barbiturates 07/27/2016 NEGATIVE  <10 ng/mL Final  .  Amobarbital 07/27/2016 Not Tested   Final  . Hazle Nordmann 07/27/2016 Not Tested   Final  . Pentobarbital 07/27/2016 Not Tested   Final  . Phenobarbital 07/27/2016 Not Tested   Final  . [place] Donovan 07/27/2016 Not Tested   Final  . Benzodiazepines 07/27/2016 POSITIVE* <0.50 ng/mL Final  . Alprazolam 07/27/2016 0.72* <0.50 ng/mL Final  . Chlordiazepoxide 07/27/2016 Negative  <0.50 ng/mL Final  . Clonazepam 07/27/2016 Negative  <0.50 ng/mL Final  . Diazepam 07/27/2016 Negative  <0.50 ng/mL Final  . Flunitrazepam 07/27/2016 Negative  <0.50 ng/mL Final  . Flurazepam 07/27/2016 Negative  <0.50 ng/mL Final  . Lorazepam 07/27/2016 Negative  <0.50 ng/mL Final  . Midazolam  07/27/2016 Negative  <0.50 ng/mL Final  . Nordiazepam 07/27/2016 Negative  <0.50 ng/mL Final  . Oxazepam 07/27/2016 Negative  <0.50 ng/mL Final  . Temazepam 07/27/2016 Negative  <0.50 ng/mL Final  . Triazolam 07/27/2016 Negative  <0.50 ng/mL Final  . Buprenorphine 07/27/2016 NEGATIVE  <0.025 ng/mL Final  . Buprenorphine 07/27/2016 Not Tested   Final  . Naloxone 07/27/2016 Not Tested   Final  . Norbuprenorphine 07/27/2016 Not Tested   Final  . Cocaine 07/27/2016 NEGATIVE  <2.5 ng/mL Final  . Benzoylecgonine 07/27/2016 Not Tested   Final  . Cocaine 07/27/2016 Not Tested   Final  . Fentanyl 07/27/2016 NEGATIVE  <0.10 ng/mL Final  . Fentanyl 07/27/2016 Not Tested   Final  . Heroin Metabolite 07/27/2016 NEGATIVE  <1.0 ng/mL Final  . Heroin Metabolite 07/27/2016 Not Tested   Final  . Marijuana 07/27/2016 NEGATIVE  <2.5 ng/mL Final  . THC 07/27/2016 Not Tested   Final  . MDMA 07/27/2016 NEGATIVE  <10 ng/mL Final  . MDMA 07/27/2016 Not Tested   Final  . Meperidine 07/27/2016 NEGATIVE  <5.0 ng/mL Final  . Meperidine 07/27/2016 Not Tested   Final  . Meprobamate 07/27/2016 NEGATIVE  <2.5 ng/mL Final  . Carisoprodol 07/27/2016 Not Tested   Final  . Meprobamate 07/27/2016 Not Tested   Final  . Methadone 07/27/2016 NEGATIVE  <5.0 ng/mL Final  . EDDP 07/27/2016 Not Tested   Final  . Methadone 07/27/2016 Not Tested   Final  . Nicotine Metabolite 07/27/2016 NEGATIVE  <5.0 ng/mL Final  . Cotinine 07/27/2016 Not Tested   Final  . Opiates 07/27/2016 POSITIVE* <2.5 ng/mL Final  . Codeine 07/27/2016 Negative  <2.5 ng/mL Final  . Dihydrocodeine 07/27/2016 Negative  <2.5 ng/mL Final  . Hydrocodone 07/27/2016 Negative  <2.5 ng/mL Final  . Hydromorphone 07/27/2016 Negative  <2.5 ng/mL Final  . Morphine 07/27/2016 Negative  <2.5 ng/mL Final  . Norhydrocodone 07/27/2016 Negative  <2.5 ng/mL Final  . Noroxycodone 07/27/2016 13.2* <2.5 ng/mL Final   Noroxycodone is a metabolite of oxycodone.  . Oxycodone  07/27/2016 198.8* <2.5 ng/mL Final  . Oxymorphone 07/27/2016 Negative  <2.5 ng/mL Final  . Phencyclidine 07/27/2016 NEGATIVE  <10 ng/mL Final  . Phencyclidine 07/27/2016 Not Tested   Final  . Propoxyphene  07/27/2016 NEGATIVE  <5.0 ng/mL Final  . Propoxyphene 07/27/2016 Not Tested   Final  . Tapentadol 07/27/2016 NEGATIVE  <5.0 ng/mL Final  . Tapentadol 07/27/2016 Not Tested   Final  . Tramadol 07/27/2016 NEGATIVE  <5.0 ng/mL Final  . Tramadol 07/27/2016 Not Tested   Final  . Zolpidem 07/27/2016 NEGATIVE  <5.0 ng/mL Final   Comment: For additional information, please refer to http://education.questdiagnostics.com/faq/FAQ186 (This link is being provided for informational/ educational purposes only.) This drug testing is for medical treatment only. Analysis was performed as non-forensic testing and these results should be used only by healthcare providers to render diagnosis or treatment, or to monitor progress of medical conditions. For assistance with interpreting these drug results, please contact a [employer] Toxicology Specialist: [phone] [place] [phone]), M-F, 8am-6pm EST. These tests were developed and their analytical performance characteristics have been determined by [facility]. They have not been cleared or approved by the FDA. These assays have been validated pursuant to the CLIA regulations and are used for clinical purposes.   . Zolpidem 07/27/2016 Not Tested   Final  Admission on 07/23/2016, Discharged on 07/23/2016  Component Date Value Ref Range Status  . WBC 07/23/2016 11.8* 4.0 - 10.5 K/uL Final  . RBC 07/23/2016 3.68* 3.87 - 5.11 MIL/uL Final  . Hemoglobin 07/23/2016 9.7* 12.0 - 15.0 g/dL Final  . HCT 07/23/2016 31.7* 36.0 - 46.0 % Final  . MCV 07/23/2016 86.1  78.0 - 100.0 fL Final  . MCH 07/23/2016 26.4  26.0 - 34.0 pg Final  . MCHC 07/23/2016 30.6  30.0 - 36.0 g/dL Final  . RDW 07/23/2016 14.8  11.5 - 15.5 % Final  . Platelets  07/23/2016 317  150 - 400 K/uL Final  . Sodium 07/23/2016 138  135 - 145 mmol/L Final  . Potassium 07/23/2016 4.8  3.5 - 5.1 mmol/L Final  . Chloride 07/23/2016 103  101 - 111 mmol/L Final  . CO2 07/23/2016 27  22 - 32 mmol/L Final  . Glucose, Bld 07/23/2016 161* 65 - 99 mg/dL Final  . BUN 07/23/2016 21* 6 - 20 mg/dL Final  . Creatinine, Ser 07/23/2016 1.44* 0.44 - 1.00 mg/dL Final  . Calcium 07/23/2016 8.9  8.9 - 10.3 mg/dL Final  . GFR calc non Af Amer 07/23/2016 34* >60 mL/min Final  . GFR calc Af Amer 07/23/2016 39* >60 mL/min Final   Comment: (NOTE) The eGFR has been calculated using the CKD EPI equation. This calculation has not been validated in all clinical situations. eGFR's persistently <60 mL/min signify possible Chronic Kidney Disease.   . Anion gap 07/23/2016 8  5 - 15 Final  . Prothrombin Time 07/23/2016 21.2* 11.4 - 15.2 seconds Final  . INR 07/23/2016 1.81   Final  . Troponin i, poc 07/23/2016 0.00  0.00 - 0.08 ng/mL Final  . Comment 3 07/23/2016          Final   Comment: Due to the release kinetics of cTnI, a negative result within the first hours of the onset of symptoms does not rule out myocardial infarction with certainty. If myocardial infarction is still suspected, repeat the test at appropriate intervals.   [name] D-Dimer, Quant 07/23/2016 0.45  0.00 - 0.50 ug/mL-FEU Final   Comment: (NOTE) At the manufacturer cut-off of 0.50 ug/mL FEU, this assay has been documented to exclude PE with a sensitivity and negative predictive value of 97 to 99%.  At this time, this assay has not been approved by the FDA to exclude  DVT/VTE. Results should be correlated with clinical presentation.   . Troponin i, poc 07/23/2016 0.00  0.00 - 0.08 ng/mL Final  . Comment 3 07/23/2016          Final   Comment: Due to the release kinetics of cTnI, a negative result within the first hours of the onset of symptoms does not rule out myocardial infarction with certainty. If myocardial  infarction is still suspected, repeat the test at appropriate intervals.   There may be more visits with results that are not included.    No results found.   Assessment/Plan   ICD-10-CM   1. Uncontrolled type 2 diabetes mellitus with diabetic nephropathy, with long-term current use of insulin (HCC) E11.21 Lipid panel   E11.65 Hemoglobin A1C   Z79.4   2. Simple chronic bronchitis (HCC) J41.0 budesonide-formoterol (SYMBICORT) 160-4.5 MCG/ACT inhaler  3. Memory loss R41.3   4. Essential hypertension I10   5. Severe episode of recurrent major depressive disorder, without psychotic features ([place]) F33.2   6. GERD with esophagitis K21.0 SLP modified barium swallow  7. Dysphagia, unspecified type R13.10 SLP modified barium swallow   PLEASE USE SYMBICORT 2 PUFFs 2 TIMES DAILY FOR BRONCHITIS. RINSE MOUTH AFTER EACH USE.  Please complete Prescription Assistance form for Toujeo  Will call with swallow test appt  Will call with lab results  T/c UA for urinary frequency of no better after resuming Toujeo  Please bring all medication bottles to next office visit. New rx for sertraline called into pharmacy  Follow up with specialists as scheduled  FOLLOW UP IN 1 MONTH FOR BRONCHITIS, DM   Macalister Arnaud S. Perlie Gold  [facility] [address] [place], [place] [address] [phone] Cell (Monday-Friday 8 AM - 5 PM) [phone] After 5 PM and follow prompts

## 2016-10-13 NOTE — Patient Instructions (Addendum)
PLEASE USE SYMBICORT 2 PUFFs 2 TIMES DAILY FOR BRONCHITIS. RINSE MOUTH AFTER EACH USE.  Please complete Prescription Assistance form for Toujeo  Will call with swallow test appt  Please bring all medication bottles to next office visit  Follow up with specialists as scheduled  FOLLOW UP IN 1 MONTH FOR BRONCHITIS, DM

## 2016-10-14 LAB — LIPID PANEL
Cholesterol: 99 mg/dL (ref <200)
HDL: 34 mg/dL — ABNORMAL LOW (ref >50)
LDL Cholesterol: 41 mg/dL (ref <100)
Total CHOL/HDL Ratio: 2.9 Ratio (ref <5.0)
Triglycerides: 118 mg/dL (ref <150)
VLDL: 24 mg/dL (ref <30)

## 2016-10-14 LAB — BASIC METABOLIC PANEL
BUN: 20 mg/dL (ref 7–25)
CO2: 26 mmol/L (ref 20–31)
Calcium: 8.7 mg/dL (ref 8.6–10.4)
Chloride: 97 mmol/L — ABNORMAL LOW (ref 98–110)
Creat: 1.25 mg/dL — ABNORMAL HIGH (ref 0.60–0.93)
Glucose, Bld: 408 mg/dL — ABNORMAL HIGH (ref 65–99)
Potassium: 4.7 mmol/L (ref 3.5–5.3)
Sodium: 131 mmol/L — ABNORMAL LOW (ref 135–146)

## 2016-10-14 LAB — HEMOGLOBIN A1C
Hgb A1c MFr Bld: 12.1 % — ABNORMAL HIGH (ref <5.7)
Mean Plasma Glucose: 301 mg/dL

## 2016-10-16 ENCOUNTER — Telehealth: Payer: Self-pay

## 2016-10-16 DIAGNOSIS — Z794 Long term (current) use of insulin: Principal | ICD-10-CM

## 2016-10-16 DIAGNOSIS — IMO0002 Reserved for concepts with insufficient information to code with codable children: Secondary | ICD-10-CM

## 2016-10-16 DIAGNOSIS — E1121 Type 2 diabetes mellitus with diabetic nephropathy: Secondary | ICD-10-CM

## 2016-10-16 DIAGNOSIS — E1165 Type 2 diabetes mellitus with hyperglycemia: Principal | ICD-10-CM

## 2016-10-16 MED ORDER — INSULIN GLARGINE 300 UNIT/ML ~~LOC~~ SOPN: 70.0000 [IU] | PEN_INJECTOR | Freq: Two times a day (BID) | SUBCUTANEOUS | 6 refills | Status: DC

## 2016-10-16 MED ORDER — INSULIN ASPART 100 UNIT/ML FLEXPEN: 30.0000 [IU] | PEN_INJECTOR | Freq: Two times a day (BID) | SUBCUTANEOUS | 6 refills | Status: DC

## 2016-10-16 NOTE — Telephone Encounter (Signed)
-----   Message from [place], [place] sent at 10/14/2016  2:07 PM EDT ----- Diabetes severely uncontrolled - resume Toujeo inject 70 units 2 times daily; continue novolog 2 times daily; cholesterol stable; reduced kidney function but stable; watch complex carbs and fatty foods; follow up as scheduled

## 2016-10-17 DIAGNOSIS — E1169 Type 2 diabetes mellitus with other specified complication: Secondary | ICD-10-CM | POA: Diagnosis not present

## 2016-10-18 ENCOUNTER — Telehealth: Payer: Self-pay | Admitting: *Deleted

## 2016-10-18 DIAGNOSIS — R350 Frequency of micturition: Secondary | ICD-10-CM

## 2016-10-18 NOTE — Telephone Encounter (Signed)
1.) Tessalon perles 200mg  #30 take 1 tab po TID prn cough with 1 RF  2.) please drop off sample to office for UA with micro (urinary frequency)  3.) can we find out what will her insurance cover regarding insulin?

## 2016-10-18 NOTE — Telephone Encounter (Signed)
Patient called with several concerns:  1. Patient wants something for her cough. Nonproductive. Keeps her up at night. Doing the inhaler with no relief with the cough.   2. Patient wants something for her Urinary Frequency. States she has to go to the bathroom frequently at night and she is tired of running back and fourths to the bathroom. It keeps her up all night.   3. The Novolog Prescribed at her appointment she stated that she CANNOT afford this. Stated that it is $189.00/month out of pocket. Stated that she just spent over $3000 on hearing aides. Wants something different. Patient has not completed her patient assistance forms for Toujeo either.   Please Advise.

## 2016-10-19 ENCOUNTER — Other Ambulatory Visit: Payer: Self-pay | Admitting: Internal Medicine

## 2016-10-19 ENCOUNTER — Other Ambulatory Visit: Payer: PPO

## 2016-10-19 DIAGNOSIS — R350 Frequency of micturition: Secondary | ICD-10-CM | POA: Diagnosis not present

## 2016-10-19 MED ORDER — BENZONATATE 100 MG PO CAPS: 200.0000 mg | ORAL_CAPSULE | Freq: Three times a day (TID) | ORAL | 1 refills | Status: DC | PRN

## 2016-10-19 NOTE — Telephone Encounter (Signed)
Patient daughter notified and agreed. Will leave Urine cup up front for her to pick up to obtain the urine, order placed. Rx faxed to pharmacy for the cough medication. Daughter will call the insurance company to see what they cover for the insulin and will call us back with that information.

## 2016-10-20 LAB — URINALYSIS, ROUTINE W REFLEX MICROSCOPIC
Bilirubin Urine: NEGATIVE
Glucose, UA: NEGATIVE
Ketones, ur: NEGATIVE
Nitrite: NEGATIVE
Protein, ur: NEGATIVE
Specific Gravity, Urine: 1.008 (ref 1.001–1.035)
pH: 6 (ref 5.0–8.0)

## 2016-10-20 LAB — URINALYSIS, MICROSCOPIC ONLY
Bacteria, UA: NONE SEEN [HPF]
Casts: NONE SEEN [LPF]
Crystals: NONE SEEN [HPF]
RBC / HPF: NONE SEEN RBC/HPF (ref <=2)
Squamous Epithelial / LPF: NONE SEEN [HPF] (ref <=5)
Yeast: NONE SEEN [HPF]

## 2016-10-22 LAB — URINE CULTURE

## 2016-10-25 DIAGNOSIS — H35371 Puckering of macula, right eye: Secondary | ICD-10-CM | POA: Diagnosis not present

## 2016-10-25 DIAGNOSIS — H353112 Nonexudative age-related macular degeneration, right eye, intermediate dry stage: Secondary | ICD-10-CM | POA: Diagnosis not present

## 2016-10-25 DIAGNOSIS — H353122 Nonexudative age-related macular degeneration, left eye, intermediate dry stage: Secondary | ICD-10-CM | POA: Diagnosis not present

## 2016-10-25 DIAGNOSIS — E119 Type 2 diabetes mellitus without complications: Secondary | ICD-10-CM | POA: Diagnosis not present

## 2016-10-26 ENCOUNTER — Telehealth: Payer: Self-pay

## 2016-10-26 ENCOUNTER — Ambulatory Visit (INDEPENDENT_AMBULATORY_CARE_PROVIDER_SITE_OTHER): Payer: PPO | Admitting: Internal Medicine

## 2016-10-26 ENCOUNTER — Encounter: Payer: Self-pay | Admitting: Internal Medicine

## 2016-10-26 VITALS — BP 130/68 | HR 66 | Ht 67.0 in | Wt 333.0 lb

## 2016-10-26 DIAGNOSIS — R1310 Dysphagia, unspecified: Secondary | ICD-10-CM | POA: Diagnosis not present

## 2016-10-26 DIAGNOSIS — Z9884 Bariatric surgery status: Secondary | ICD-10-CM | POA: Diagnosis not present

## 2016-10-26 DIAGNOSIS — Z794 Long term (current) use of insulin: Secondary | ICD-10-CM | POA: Diagnosis not present

## 2016-10-26 DIAGNOSIS — Z7901 Long term (current) use of anticoagulants: Secondary | ICD-10-CM | POA: Diagnosis not present

## 2016-10-26 DIAGNOSIS — R1319 Other dysphagia: Secondary | ICD-10-CM

## 2016-10-26 DIAGNOSIS — E119 Type 2 diabetes mellitus without complications: Secondary | ICD-10-CM | POA: Diagnosis not present

## 2016-10-26 DIAGNOSIS — H353122 Nonexudative age-related macular degeneration, left eye, intermediate dry stage: Secondary | ICD-10-CM | POA: Diagnosis not present

## 2016-10-26 DIAGNOSIS — H35371 Puckering of macula, right eye: Secondary | ICD-10-CM | POA: Diagnosis not present

## 2016-10-26 DIAGNOSIS — H353112 Nonexudative age-related macular degeneration, right eye, intermediate dry stage: Secondary | ICD-10-CM | POA: Diagnosis not present

## 2016-10-26 DIAGNOSIS — H43821 Vitreomacular adhesion, right eye: Secondary | ICD-10-CM | POA: Diagnosis not present

## 2016-10-26 DIAGNOSIS — H43391 Other vitreous opacities, right eye: Secondary | ICD-10-CM | POA: Diagnosis not present

## 2016-10-26 NOTE — Telephone Encounter (Signed)
Recommend she has a lovenox bridge due to hx PE. It is not safe to hold her coumadin

## 2016-10-26 NOTE — Patient Instructions (Signed)
   You have been scheduled for an endoscopy. Please follow written instructions given to you at your visit today. If you use inhalers (even only as needed), please bring them with you on the day of your procedure.  You will be contaced by our office prior to your procedure for directions on holding your Coumadin/Warfarin.  If you do not hear from our office 1 week prior to your scheduled procedure, please call [phone] to discuss.  I appreciate the opportunity to care for you. Carl Gessner, MD, FACG 

## 2016-10-26 NOTE — Telephone Encounter (Signed)
[facility] [address]  [place] [address]  10/26/2016   RE: [name] DOB: 02-Jan-1935 MRN: [id number]   Dear Gildardo Cranker D.O.,    We have scheduled the above patient for an endoscopic procedure. Our records show that she is on anticoagulation therapy.   Please advise as to how long the patient may come off her therapy of warfarin prior to the EGD procedure, which is scheduled for 11/07/16 at [facility]. Dr Carlean Purl would like clearance to hold the warfarin for 3 days if okay with you.  Please fax back/ or route the completed form to [name], [place] at [phone].   Sincerely,    Silvano Rusk, MD, [facility]

## 2016-10-26 NOTE — Progress Notes (Signed)
[name] 82 y.o. 1934/07/30 [id number]  Assessment & Plan:   Encounter Diagnoses  Name Primary?  . Esophageal dysphagia Yes  . On warfarin therapy   . Type 2 diabetes mellitus with insulin therapy ([place])   . S/P bariatric surgery and reversal      She could have esophageal candidiasis. Her diabetes is out of control. Since she is on warfarin therapy and needs an upper GI endoscopy to evaluate and possibly treat this will need to hold that. He has a history of PE. She tells me she is held her warfarin in the past with no bridge. Will query her primary care provider about this. Can hold warfarin 3 days. Needs to schedule upper GI endoscopy at the hospital due to her comorbidity she's increased risk of problems.The risks and benefits as well as alternatives of endoscopic procedure(s) have been discussed and reviewed. All questions answered. The patient agrees to proceed. I've also reviewed the potential risk of recurrent blood clot and pulmonary embolism off warfarin.  I appreciate the opportunity to care for this patient. CC: Gildardo Cranker, DO   Subjective:   Chief Complaint:  HPI The patient is here with her daughter, she is reporting a four-month history of intermittent solid food dysphagia. For the past month or so she's had a bad cough that she thinks might be getting under control but it still problematic. She denies significant frequent overt heartburn though there is some. She is on a PPI. It's very painful to swallow at times but that sounds like after she has a food impaction. She'll drink water and the food will pass. She is wheelchair-bound with limited walking. She has chronic constipation but that is under control with a stool softener and other regimen. No prior endoscopy or colonoscopy. Allergies  Allergen Reactions  . Sulfonamide Derivatives Swelling    Mouth swelling  . Tramadol Nausea And Vomiting   Current Meds  Medication Sig  . albuterol (PROVENTIL  HFA;VENTOLIN HFA) 108 (90 Base) MCG/ACT inhaler Inhale 1-2 puffs into the lungs every 6 (six) hours as needed for wheezing or shortness of breath.  . anastrozole (ARIMIDEX) 1 MG tablet Take 1 tablet (1 mg total) by mouth daily.  . benzonatate (TESSALON PERLES) 100 MG capsule Take 2 capsules (200 mg total) by mouth 3 (three) times daily as needed for cough.  . budesonide-formoterol (SYMBICORT) 160-4.5 MCG/ACT inhaler Inhale 2 puffs into the lungs 2 (two) times daily. For bronchitis  . cetirizine (ZYRTEC) 10 MG tablet Take 10 mg by mouth at bedtime.  . docusate sodium (COLACE) 100 MG capsule Take 100 mg by mouth daily as needed for mild constipation. TAKE PER BOTTLE AS NEEDED FOR CONSTIPATION   . DULoxetine (CYMBALTA) 60 MG capsule TAKE ONE CAPSULE BY MOUTH ONCE DAILY FOR ANXIETY  . insulin aspart (NOVOLOG FLEXPEN) 100 UNIT/ML FlexPen Inject 30 Units into the skin 2 (two) times daily.  . Insulin Glargine (TOUJEO SOLOSTAR) 300 UNIT/ML SOPN Inject 70 Units into the skin 2 (two) times daily. (before breakfast and before supper)  . isosorbide mononitrate (IMDUR) 60 MG 24 hr tablet TAKE ONE TABLET BY MOUTH ONCE DAILY  . Lancets (ONETOUCH ULTRASOFT) lancets Check blood sugar 2-3 times daily as directed  . losartan (COZAAR) 25 MG tablet Take 1 tablet (25 mg total) by mouth daily.  . meclizine (ANTIVERT) 25 MG tablet Take 1 tablet (25 mg total) by mouth 3 (three) times daily as needed for dizziness.  . metoprolol succinate (TOPROL-XL) 25 MG  24 hr tablet TAKE ONE TABLET BY MOUTH ONCE DAILY FOR BLOOD PRESSURE  . Multiple Vitamins-Minerals (HAIR/SKIN/NAILS/BIOTIN PO) Take 1 tablet by mouth every evening.  . nitroGLYCERIN (NITROSTAT) 0.4 MG SL tablet Take one tablet under the tongue every 5 minutes as needed for chest pain  . ONETOUCH VERIO test strip Use to test blood sugar three times daily. Dx: E11.00  . oxyCODONE-acetaminophen (PERCOCET) 10-325 MG tablet Take 1 tablet by mouth every 6 (six) hours as needed  for pain.  . pantoprazole (PROTONIX) 40 MG tablet TAKE ONE TABLET BY MOUTH ONCE DAILY  . pravastatin (PRAVACHOL) 20 MG tablet TAKE 1 TABLET BY MOUTH ONCE DAILY FOR CHOLESTEROL  . sertraline (ZOLOFT) 50 MG tablet Take 1 tablet (50 mg total) by mouth at bedtime.  . sodium chloride (OCEAN) 0.65 % SOLN nasal spray RINSE AS NEEDED FOR SINUS CONGESTION  . Spacer/Aero-Holding Chambers (AEROCHAMBER PLUS FLO-VU LARGE) MISC 1 each by Other route once.  [name] ULORIC 40 MG tablet TAKE ONE TABLET BY MOUTH ONCE DAILY  . warfarin (COUMADIN) 5 MG tablet Take 1 tablet daily except 1 & 1/2 tablets on Tuesdays and Thursdays   Past Medical History:  Diagnosis Date  . Allergy    takes Mucinex daily as needed  . Anemia, unspecified   . Anxiety    takes Clonazepam daily as needed  . Arthritis   . CHF (congestive heart failure) (HCC)    takes Furosemide daily  . Coronary artery disease    a. s/p IMI 2004 tx with BMS to RCA;  b. s/p Promus DES to RCA 2/12 (cath: LM 20-30%, pLAD 20-30%, mLAD 50%, RI 30%, pRCA 60%, mRCA 90% - tx with PCI);  c. myoview 1/07: EF 49%, inf scar, no isch  . Coronary atherosclerosis of native coronary artery   . Depression    takes Cymbalta daily  . Diabetes mellitus    insulin daily  . Dysuria   . GERD (gastroesophageal reflux disease)    takes Protonix daily  . Gout, unspecified    takes Colchicine daily  . Headache    occasionally  . History of blood clots about 52yrs ago   in legs-takes Coumadin   . Hyperlipidemia    takes Pravastatin daily  . Hypertension    takes Imdur and Metoprolol daily  . Insomnia   . Joint pain   . Joint swelling   . Myocardial infarction ([place]) 2004  . Numbness   . Obstructive sleep apnea   . Osteoarthritis   . Osteoarthritis   . Osteoarthrosis, unspecified whether generalized or localized, lower leg   . Pain, chronic   . Polymyalgia rheumatica ([place])   . Pulmonary emboli ([place]) 9/13   felt to need lifelong anticoagulation  . Shortness of  breath dyspnea    with exertion and has Albuterol inhaler prn  . Type II or unspecified type diabetes mellitus without mention of complication, uncontrolled   . Urinary incontinence    takes Linzess daily  . Vertigo    hx of;was taking Meclizine if needed   Past Surgical History:  Procedure Laterality Date  . ABDOMINAL HYSTERECTOMY     partial  . APPENDECTOMY    . blood clots/legs and lungs  2013  . BREAST BIOPSY Left 07/22/2014  . BREAST BIOPSY Left 02/10/2013  . BREAST LUMPECTOMY Left 11/05/2014  . BREAST LUMPECTOMY WITH RADIOACTIVE SEED LOCALIZATION Left 11/05/2014   Procedure: LEFT BREAST LUMPECTOMY WITH RADIOACTIVE SEED LOCALIZATION;  Surgeon: [name], MD;  Location: [facility];  Service: General;  Laterality: Left;  . CARDIAC CATHETERIZATION    . COLONOSCOPY    . CORONARY ANGIOPLASTY  2  . EXCISION OF SKIN TAG Right 11/05/2014   Procedure: EXCISION OF RIGHT EYELID SKIN TAG;  Surgeon: [name], MD;  Location: [facility];  Service: General;  Laterality: Right;  . EYE SURGERY Bilateral    cataract   . GASTRIC BYPASS  1977    reversed in 1979, [place] N/A 06/29/2014   Procedure: LEFT HEART CATHETERIZATION WITH CORONARY ANGIOGRAM;  Surgeon: Troy Sine, MD;  [facility] C[facility] LAB;  Service: Cardiovascular;  Laterality: N/A;  . MI with stent placement  2004   Social History   Social History  . Marital status: Widowed    Spouse name: N/A  . Number of children: N/A  . Years of education: N/A   Occupational History  . retired    Social History Main Topics  . Smoking status: Never Smoker  . Smokeless tobacco: Never Used  . Alcohol use No  . Drug use: No  . Sexual activity: Not Currently   Other Topics Concern  . Not on file   Social History Narrative  . No narrative on file   family history includes Arthritis in her father and sister; Breast cancer (age of onset: 50) in her mother; Diabetes in her  father and sister; Heart disease in her cousin and mother; Hypertension in her father; Obesity in her sister; Throat cancer in her father.   Review of Systems As per history of present illness. Also notable for depressed mood and anxiety at times decreased hearing dyspnea. All other review of systems negative  Objective:   Physical Exam @BP  130/68   Pulse 66   Ht 5\' 7"  (1.702 m)   Wt (!) 333 lb (151 kg) Comment: pt unable to get on scale.  weight was pt reported.  BMI 52.16 kg/m @  General:  Well-developed, well-nourished and in no acute distress Eyes:  anicteric. ENT:   Mouth and posterior pharynx free of lesions. dentures Neck:   supple w/o thyromegaly or mass.  Lungs: Clear to auscultation bilaterally. Heart:  S1S2, no rubs, murmurs, gallops. Abdomen: obese soft, non-tender Lymph:  no cervical or supraclavicular adenopathy. Extremities:   no edema, cyanosis or clubbing Skin   no rash. Neuro:  A&O x 3.  Psych:  appropriate mood and  Affect.   Data Reviewed: Hemoglobin A1c was 12% her teen days ago. 2017 CT of the chest demonstrated some postoperative changes in her stomach from prior area check surgery and reversal.

## 2016-10-27 NOTE — Telephone Encounter (Signed)
Can your office set this up for [place]?

## 2016-10-27 NOTE — Telephone Encounter (Signed)
Please set pt up for lovenox teaching via [place] if possible. Needs bridge with egd

## 2016-10-31 NOTE — Telephone Encounter (Signed)
Patty with Dr. Celesta Aver office called to follow up Message sent to Dr. Eulas Post regarding patient's procedure for EGD.   Patient does have an appointment for 1 month follow up scheduled with Oretha Ellis on Monday. Added to the notes for visit of Lovenox Bridge due to procedure with Dr. Carlean Purl on July 3 for EGD per Dr. Eulas Post.

## 2016-10-31 NOTE — Telephone Encounter (Signed)
That will not work - she has to start holding warfarin 3 days before the EGD so they need to see her before she would start holding the warfarin I think

## 2016-10-31 NOTE — Telephone Encounter (Signed)
Spoke with Rodena Piety at Gildardo Cranker D.O.'s office and patient is set up for a bridging appointment 11/06/16.  Her EGD is 11/07/16.  Is this okay time wise Dr Carlean Purl?

## 2016-11-01 ENCOUNTER — Ambulatory Visit: Payer: PPO | Admitting: Internal Medicine

## 2016-11-01 ENCOUNTER — Other Ambulatory Visit: Payer: Self-pay | Admitting: Internal Medicine

## 2016-11-01 ENCOUNTER — Encounter (HOSPITAL_COMMUNITY): Payer: Self-pay | Admitting: *Deleted

## 2016-11-01 DIAGNOSIS — I2782 Chronic pulmonary embolism: Secondary | ICD-10-CM

## 2016-11-01 MED ORDER — ENOXAPARIN SODIUM 150 MG/ML ~~LOC~~ SOLN: 150.0000 mg | Freq: Two times a day (BID) | SUBCUTANEOUS | 0 refills | Status: DC

## 2016-11-01 NOTE — Telephone Encounter (Signed)
[name] called and stated that patient needs to be bridged 3 days before the EGD. Patient doesn't have an appointment with [name] until the day before the procedure on Monday 11/06/16. Please Advise.

## 2016-11-01 NOTE — Telephone Encounter (Signed)
Rx printed and faxed to pharmacy. 

## 2016-11-02 NOTE — Telephone Encounter (Signed)
Spoke with [name] and she verbalized that she will be instructed by the pharmacy tomorrow on how to use the Lovenox.

## 2016-11-02 NOTE — Telephone Encounter (Signed)
Patient notified and agreed. [name] was instructed by pharmacy that they will teach patient how to use the Lovenox.

## 2016-11-02 NOTE — Telephone Encounter (Signed)
LMOM to return call.

## 2016-11-04 ENCOUNTER — Other Ambulatory Visit: Payer: Self-pay | Admitting: Internal Medicine

## 2016-11-06 ENCOUNTER — Ambulatory Visit (INDEPENDENT_AMBULATORY_CARE_PROVIDER_SITE_OTHER): Payer: PPO | Admitting: Pharmacotherapy

## 2016-11-06 ENCOUNTER — Encounter: Payer: Self-pay | Admitting: Pharmacotherapy

## 2016-11-06 VITALS — BP 130/86 | HR 83 | Temp 98.4°F | Ht 67.0 in | Wt 340.2 lb

## 2016-11-06 DIAGNOSIS — I2782 Chronic pulmonary embolism: Secondary | ICD-10-CM

## 2016-11-06 DIAGNOSIS — Z7901 Long term (current) use of anticoagulants: Secondary | ICD-10-CM | POA: Diagnosis not present

## 2016-11-06 LAB — POCT INR: INR: 2.8

## 2016-11-06 MED ORDER — ENOXAPARIN SODIUM 150 MG/ML ~~LOC~~ SOLN: SUBCUTANEOUS | 0 refills | Status: DC

## 2016-11-06 NOTE — Patient Instructions (Signed)
No Lovenox (enoxaparin) tomorrow morning.  Restart Lovenox (enoxaparin) and warfarin tomorrow evening.  Overlap x 5 days.

## 2016-11-06 NOTE — Progress Notes (Signed)
   Subjective:    Patient ID: [name], female    DOB: 04-13-1935, 82 y.o.   MRN: [id number]  HPI Last INR 10/09/16 was OK at 3.1 Current Coumadin dose is 5mg  QD except 7.5mg  T/Th Has been off Coumadin x 4 days and on Lovenox to prepare for EGD tomorrow. NO unusual bleeding or bruising Denies CP, SOB, falls   Review of Systems  HENT: Negative for nosebleeds.   Respiratory: Negative for shortness of breath.   Cardiovascular: Negative for chest pain.  Gastrointestinal: Negative for anal bleeding and blood in stool.  Genitourinary: Negative for hematuria.  Hematological: Does not bruise/bleed easily.       Objective:   Physical Exam  Constitutional: She is oriented to person, place, and time. She appears well-developed and well-nourished.  HENT:  Right Ear: External ear normal.  Left Ear: External ear normal.  Cardiovascular: Normal rate, regular rhythm and normal heart sounds.   Pulmonary/Chest: Effort normal and breath sounds normal.  Neurological: She is alert and oriented to person, place, and time.  Skin: Skin is warm and dry.  Psychiatric: She has a normal mood and affect. Her behavior is normal. Judgment and thought content normal.  Vitals reviewed.   BP: 130/86  HR: 83  Wt: 340lb INR 2.8      Assessment & Plan:  1.  INR at goal 2-3.  Needs to be <2.0 2.  Advised patient to increase vitamin K intake today 3.  No Lovenox tomorrow morning.  Tomorrow evening restart Lovenox and warfarin.  Overlap x 5 days 4.  RTC in 2 weeks

## 2016-11-07 ENCOUNTER — Ambulatory Visit (HOSPITAL_COMMUNITY)
Admission: RE | Admit: 2016-11-07 | Discharge: 2016-11-07 | Disposition: A | Discharge status: Home / Self Care | Payer: PPO | Source: Ambulatory Visit | Attending: Internal Medicine | Admitting: Internal Medicine

## 2016-11-07 ENCOUNTER — Encounter (HOSPITAL_COMMUNITY): Payer: Self-pay | Admitting: *Deleted

## 2016-11-07 ENCOUNTER — Ambulatory Visit (HOSPITAL_COMMUNITY): Payer: PPO | Admitting: Certified Registered Nurse Anesthetist

## 2016-11-07 ENCOUNTER — Encounter (HOSPITAL_COMMUNITY)
Admission: RE | Disposition: A | Discharge status: Home / Self Care | Payer: Self-pay | Source: Ambulatory Visit | Attending: Internal Medicine

## 2016-11-07 DIAGNOSIS — Z86711 Personal history of pulmonary embolism: Secondary | ICD-10-CM | POA: Insufficient documentation

## 2016-11-07 DIAGNOSIS — I252 Old myocardial infarction: Secondary | ICD-10-CM | POA: Diagnosis not present

## 2016-11-07 DIAGNOSIS — G4700 Insomnia, unspecified: Secondary | ICD-10-CM | POA: Insufficient documentation

## 2016-11-07 DIAGNOSIS — R1310 Dysphagia, unspecified: Secondary | ICD-10-CM

## 2016-11-07 DIAGNOSIS — M1990 Unspecified osteoarthritis, unspecified site: Secondary | ICD-10-CM | POA: Diagnosis not present

## 2016-11-07 DIAGNOSIS — J449 Chronic obstructive pulmonary disease, unspecified: Secondary | ICD-10-CM | POA: Diagnosis not present

## 2016-11-07 DIAGNOSIS — I110 Hypertensive heart disease with heart failure: Secondary | ICD-10-CM | POA: Insufficient documentation

## 2016-11-07 DIAGNOSIS — Z9884 Bariatric surgery status: Secondary | ICD-10-CM | POA: Insufficient documentation

## 2016-11-07 DIAGNOSIS — Z794 Long term (current) use of insulin: Secondary | ICD-10-CM | POA: Insufficient documentation

## 2016-11-07 DIAGNOSIS — Z86718 Personal history of other venous thrombosis and embolism: Secondary | ICD-10-CM | POA: Insufficient documentation

## 2016-11-07 DIAGNOSIS — G4730 Sleep apnea, unspecified: Secondary | ICD-10-CM | POA: Insufficient documentation

## 2016-11-07 DIAGNOSIS — K228 Other specified diseases of esophagus: Secondary | ICD-10-CM | POA: Diagnosis not present

## 2016-11-07 DIAGNOSIS — K219 Gastro-esophageal reflux disease without esophagitis: Secondary | ICD-10-CM | POA: Diagnosis not present

## 2016-11-07 DIAGNOSIS — E785 Hyperlipidemia, unspecified: Secondary | ICD-10-CM | POA: Diagnosis not present

## 2016-11-07 DIAGNOSIS — E119 Type 2 diabetes mellitus without complications: Secondary | ICD-10-CM | POA: Diagnosis not present

## 2016-11-07 DIAGNOSIS — F329 Major depressive disorder, single episode, unspecified: Secondary | ICD-10-CM | POA: Insufficient documentation

## 2016-11-07 DIAGNOSIS — Z882 Allergy status to sulfonamides status: Secondary | ICD-10-CM | POA: Insufficient documentation

## 2016-11-07 DIAGNOSIS — Q398 Other congenital malformations of esophagus: Secondary | ICD-10-CM | POA: Diagnosis not present

## 2016-11-07 DIAGNOSIS — K3189 Other diseases of stomach and duodenum: Secondary | ICD-10-CM | POA: Diagnosis not present

## 2016-11-07 DIAGNOSIS — F419 Anxiety disorder, unspecified: Secondary | ICD-10-CM | POA: Insufficient documentation

## 2016-11-07 DIAGNOSIS — M109 Gout, unspecified: Secondary | ICD-10-CM | POA: Diagnosis not present

## 2016-11-07 DIAGNOSIS — G4733 Obstructive sleep apnea (adult) (pediatric): Secondary | ICD-10-CM | POA: Insufficient documentation

## 2016-11-07 DIAGNOSIS — G894 Chronic pain syndrome: Secondary | ICD-10-CM | POA: Insufficient documentation

## 2016-11-07 DIAGNOSIS — K5909 Other constipation: Secondary | ICD-10-CM | POA: Diagnosis not present

## 2016-11-07 DIAGNOSIS — M353 Polymyalgia rheumatica: Secondary | ICD-10-CM | POA: Insufficient documentation

## 2016-11-07 DIAGNOSIS — K222 Esophageal obstruction: Secondary | ICD-10-CM | POA: Diagnosis not present

## 2016-11-07 DIAGNOSIS — I509 Heart failure, unspecified: Secondary | ICD-10-CM | POA: Diagnosis not present

## 2016-11-07 DIAGNOSIS — Z7901 Long term (current) use of anticoagulants: Secondary | ICD-10-CM | POA: Insufficient documentation

## 2016-11-07 DIAGNOSIS — R1319 Other dysphagia: Secondary | ICD-10-CM

## 2016-11-07 DIAGNOSIS — I2510 Atherosclerotic heart disease of native coronary artery without angina pectoris: Secondary | ICD-10-CM | POA: Diagnosis not present

## 2016-11-07 DIAGNOSIS — Z885 Allergy status to narcotic agent status: Secondary | ICD-10-CM | POA: Diagnosis not present

## 2016-11-07 DIAGNOSIS — Z79899 Other long term (current) drug therapy: Secondary | ICD-10-CM | POA: Insufficient documentation

## 2016-11-07 DIAGNOSIS — Z955 Presence of coronary angioplasty implant and graft: Secondary | ICD-10-CM | POA: Insufficient documentation

## 2016-11-07 DIAGNOSIS — K8020 Calculus of gallbladder without cholecystitis without obstruction: Secondary | ICD-10-CM | POA: Diagnosis not present

## 2016-11-07 DIAGNOSIS — Z6841 Body Mass Index (BMI) 40.0 and over, adult: Secondary | ICD-10-CM | POA: Insufficient documentation

## 2016-11-07 HISTORY — PX: ESOPHAGOGASTRODUODENOSCOPY (EGD) WITH PROPOFOL: SHX5813

## 2016-11-07 LAB — GLUCOSE, CAPILLARY: Glucose-Capillary: 92 mg/dL (ref 65–99)

## 2016-11-07 SURGERY — ESOPHAGOGASTRODUODENOSCOPY (EGD) WITH PROPOFOL
Anesthesia: Monitor Anesthesia Care

## 2016-11-07 MED ORDER — LIDOCAINE HCL (CARDIAC) 20 MG/ML IV SOLN
INTRAVENOUS | Status: DC | PRN
  Administered 2016-11-07: 50 mg via INTRAVENOUS

## 2016-11-07 MED ORDER — LACTATED RINGERS IV SOLN
INTRAVENOUS | Status: DC
  Administered 2016-11-07: 1000 mL via INTRAVENOUS
  Administered 2016-11-07: 13:00:00 via INTRAVENOUS

## 2016-11-07 MED ORDER — SODIUM CHLORIDE 0.9 % IV SOLN: INTRAVENOUS | Status: DC

## 2016-11-07 MED ORDER — PROPOFOL 500 MG/50ML IV EMUL: INTRAVENOUS | Status: DC | PRN

## 2016-11-07 MED ORDER — LIDOCAINE 2% (20 MG/ML) 5 ML SYRINGE
INTRAMUSCULAR | Status: AC
  Filled 2016-11-07: qty 5

## 2016-11-07 MED ORDER — PROPOFOL 10 MG/ML IV BOLUS
INTRAVENOUS | Status: AC
  Filled 2016-11-07: qty 20

## 2016-11-07 MED ORDER — PROPOFOL 500 MG/50ML IV EMUL
INTRAVENOUS | Status: DC | PRN
  Administered 2016-11-07 (×2): 30 mg via INTRAVENOUS
  Administered 2016-11-07: 20 mg via INTRAVENOUS

## 2016-11-07 MED ORDER — PROPOFOL 10 MG/ML IV BOLUS
INTRAVENOUS | Status: AC
  Filled 2016-11-07: qty 40

## 2016-11-07 MED ORDER — PROPOFOL 500 MG/50ML IV EMUL
INTRAVENOUS | Status: DC | PRN
  Administered 2016-11-07: 100 ug/kg/min via INTRAVENOUS

## 2016-11-07 SURGICAL SUPPLY — 14 items

## 2016-11-07 NOTE — Op Note (Signed)
[facility] Patient Name: [name] Procedure Date: 11/07/2016 MRN: [id number] Attending MD: Gatha Mayer , MD Date of Birth: 06/28/1934 CSN: [id number] Age: 82 Admit Type: Outpatient Procedure:                Upper GI endoscopy Indications:              Dysphagia Providers:                Gatha Mayer, MD, Laverta Baltimore RN, RN,                            William Dalton, Technician Referring MD:              Medicines:                Propofol per Anesthesia, Monitored Anesthesia Care Complications:            No immediate complications. Estimated Blood Loss:     Estimated blood loss: none. Procedure:                Pre-Anesthesia Assessment:                           - Prior to the procedure, a History and Physical                            was performed, and patient medications and                            allergies were reviewed. [name] tolerance of                            previous anesthesia was also reviewed. The risks                            and benefits of the procedure and the sedation                            options and risks were discussed with the patient.                            All questions were answered, and informed consent                            was obtained. Prior Anticoagulants: The patient                            last took Coumadin (warfarin) 5 days and Lovenox                            (enoxaparin) 1 day prior to the procedure. ASA                            Grade Assessment: III - A patient with severe  systemic disease. After reviewing the risks and                            benefits, the patient was deemed in satisfactory                            condition to undergo the procedure.                           After obtaining informed consent, the endoscope was                            passed under direct vision. Throughout the                            procedure, the  patient's blood pressure, pulse, and                            oxygen saturations were monitored continuously. The                            Endoscope was introduced through the mouth, and                            advanced to the second part of duodenum. The upper                            GI endoscopy was accomplished without difficulty.                            The patient tolerated the procedure well. Scope In: Scope Out: Findings:      The examined esophagus was moderately tortuous. A TTS dilator was passed       through the scope. Dilation with a 15-16.5-18 mm balloon dilator was       performed to 18 mm. I withdrew balloon inflated at 18 mm - no difficulty       and no heme.      A benign-appearing, intrinsic mild stenosis was found in the gastric       body. This was traversed. It was related to takedown and reversal of       prior gastric bypass - > 20 mm lumen. Some pill granules trapped above       it.      The exam was otherwise without abnormality.      The cardia and gastric fundus were otherwise normal post-op on       retroflexion. Impression:               - Tortuous esophagus. Raises ? of dysmotility -                            dilated 18 mm balloon retrograde pull through                           - Gastric stenosis was found in the gastric body.                           -  The examination was otherwise normal.                           - No specimens collected. Moderate Sedation:      N/A- Per Anesthesia Care Recommendation:           - Patient has a contact number available for                            emergencies. The signs and symptoms of potential                            delayed complications were discussed with the                            patient. Return to normal activities tomorrow.                            Written discharge instructions were provided to the                            patient.                           - Resume previous  diet.                           - Continue present medications.                           - Resume Coumadin (warfarin) today and Lovenox                            (enoxaparin) today at prior doses. Refer to                            referring physician for further adjustment of                            therapy.                           - Chopped diet Dysphagia 3 after clear liqs x 1 hr. Procedure Code(s):        --- Professional ---                           [phone], Esophagogastroduodenoscopy, flexible,                            transoral; with transendoscopic balloon dilation of                            esophagus (less than 30 mm diameter) Diagnosis Code(s):        --- Professional ---                           Q39.9, Congenital  malformation of esophagus,                            unspecified                           K31.89, Other diseases of stomach and duodenum                           R13.10, Dysphagia, unspecified CPT copyright 2016 American Medical Association. All rights reserved. The codes documented in this report are preliminary and upon coder review may  be revised to meet current compliance requirements. Gatha Mayer, MD 11/07/2016 1:17:25 PM This report has been signed electronically. Number of Addenda: 0

## 2016-11-07 NOTE — Interval H&P Note (Signed)
History and Physical Interval Note:  11/07/2016 12:40 PM  [name]  has presented today for surgery, with the diagnosis of dysphagia  The various methods of treatment have been discussed with the patient and family. After consideration of risks, benefits and other options for treatment, the patient has consented to  Procedure(s): ESOPHAGOGASTRODUODENOSCOPY (EGD) WITH PROPOFOL (N/A) as a surgical intervention .  [name] history has been reviewed, patient examined, no change in status, stable for surgery.  I have reviewed the patient's chart and labs.  Questions were answered to the patient's satisfaction.     Silvano Rusk

## 2016-11-07 NOTE — Discharge Instructions (Addendum)
I think the esophagus is not squeezing properly. I stretched it and that may make a difference but unfortunately this can be hard to make better. There is also a pouch in the top of the stomach from the prior gastric bypass and food could be collecting in there. I cannot do anything to change that and I suspect most or all of the problems are coming from abnormal function of the esophagus.  No signs of cancer, infection or other major problems.  So let's see how it goes and I also think changing the shape and size of the foods you eat makes sense.  ALSO MAKE SURE TO ALWAYS SIT UP WHILE EATING AND 1 HOUR AFTER  I am giving you info on a dysphagia 3 diet - please follow that.  If you are not significantly better with this let me know and will consider other testing or treatment.  Please resume warfarin and Lovenox tonight as instructed by the pharmacist and get INR checked in 2 weeks  I appreciate the opportunity to care for you. Gatha Mayer, MD, FACG  YOU HAD AN ENDOSCOPIC PROCEDURE TODAY: Refer to the procedure report and other information in the discharge instructions given to you for any specific questions about what was found during the examination. If this information does not answer your questions, please call Dr. Celesta Aver office at [phone] to clarify.   YOU SHOULD EXPECT: Some feelings of bloating in the abdomen. Passage of more gas than usual. Walking can help get rid of the air that was put into your GI tract during the procedure and reduce the bloating. If you had a lower endoscopy (such as a colonoscopy or flexible sigmoidoscopy) you may notice spotting of blood in your stool or on the toilet paper. Some abdominal soreness may be present for a day or two, also.  DIET:  Clear liquids only until 2 PM then may start dysphagia 3 diet  ACTIVITY: Your care partner should take you home directly after the procedure. You should plan to take it easy, moving slowly for the  rest of the day. You can resume normal activity the day after the procedure however YOU SHOULD NOT DRIVE, use power tools, machinery or perform tasks that involve climbing or major physical exertion for 24 hours (because of the sedation medicines used during the test).   SYMPTOMS TO REPORT IMMEDIATELY: A gastroenterologist can be reached at any hour. Please call [phone]  for any of the following symptoms: Following upper endoscopy (EGD, EUS, ERCP, esophageal dilation) Vomiting of blood or coffee ground material  New, significant abdominal pain  New, significant chest pain or pain under the shoulder blades  Painful or persistently difficult swallowing  New shortness of breath  Black, tarry-looking or red, bloody stools       Dysphagia Diet Level 3, Mechanically Advanced The dysphagia level 3 diet includes foods that are soft, moist, and can be chopped into 1-inch chunks. This diet is helpful for people with mild swallowing difficulties. It reduces the risk of food getting caught in the windpipe, trachea, or lungs. What do I need to know about this diet?  You may eat foods that are soft and moist.  If you were on the dysphagia level 1 or level 2 diets, you may eat any of the foods included on those lists.  Avoid foods that are dry, hard, sticky, chewy, coarse, and crunchy. Also avoid large cuts of food.  Take small bites. Each  bite should contain 1 inch or less of food.  Thicken liquids if instructed by your health care provider. Follow your health care provider's instructions on how to do this and to what consistency.  See your dietitian or speech language pathologist regularly for help with your dietary changes. What foods can I eat? Grains Moist breads without nuts or seeds. Biscuits, muffins, pancakes, and waffles well-moistened with syrup, jelly, margarine, or butter. Smooth cereals with plenty of milk to moisten them. Moist bread stuffing. Moist rice. Vegetables All  cooked, soft vegetables. Shredded lettuce. Tender fried potatoes. Fruits All canned and cooked fruits. Soft, peeled fresh fruits, such as peaches, nectarines, kiwis, cantaloupe, honeydew melon, and watermelon without seeds. Soft berries, such as strawberries. Meat and Other Protein Sources Moist ground or finely diced or sliced meats. Solid, tender cuts of meat. Meatloaf. Hamburger with a bun. Sausage patty. Deli thin-sliced lunch meat. Chicken, egg, or tuna salad sandwich. Sloppy joe. Moist fish. Eggs prepared any way. Casseroles with small chunks of meats, ground meats, or tender meats. Dairy Cheese spreads without coarse large chunks. Shredded cheese. Cheese slices. Cottage cheese. Milk at the right texture. Smooth frappes. Yogurt without nuts or coconut. Ask your health care provider whether you can have frozen desserts (such as malts or milk shakes) and thin liquids. Sweets/Desserts Soft, smooth, moist desserts. Non-chewy, smooth candy. Jam. Jelly. Honey. Preserves. Ask your health care provider whether you can have frozen desserts. Fats and Oils Butter. Oils. Margarine. Mayonnaise. Gravy. Spreads. Other All seasonings and sweeteners. All sauces without large chunks. The items listed above may not be a complete list of recommended foods or beverages. Contact your dietitian for more options. What foods are not recommended? Grains Coarse or dry cereals. Dry breads. Toast. Crackers. Tough, crusty breads, such French bread and baguettes. Tough, crisp fried potatoes. Potato skins. Dry bread stuffing. Granola. Popcorn. Chips. Vegetables All raw vegetables except shredded lettuce. Cooked corn. Rubbery or stiff cooked vegetables. Stringy vegetables, such as celery. Fruits Hard fruits that are difficult to chew, such as apples or pears. Stringy, high-pulp fruits, such as pineapple, papaya, or mango. Fruits with tough skins, such as grapes. Coconut. All dried fruits. Fruit leather. Fruit roll-ups.  Fruit snacks. Meat and Other Protein Sources Dry or tough meats or poultry. Dry fish. Fish with bones. Peanut butter. All nuts and seeds. Dairy Any with nuts, seeds, chocolate chips, dried fruit, coconut, or pineapple. Sweets/Desserts Dry cakes. Chewy or dry cookies. Any with nuts, seeds, dry fruits, coconut, pineapple, or anything dry, sticky, or hard. Chewy caramel. Licorice. Taffy-type candies. Ask your health care provider whether you can have frozen desserts. Fats and Oils Any with chunks, nuts, seeds, or pineapple. Olives. Angie Fava. Other Soups with tough or large chunks of meats, poultry, or vegetables. Corn or clam chowder. The items listed above may not be a complete list of foods and beverages to avoid. Contact your dietitian for more information. This information is not intended to replace advice given to you by your health care provider. Make sure you discuss any questions you have with your health care provider. Document Released: 04/24/2005 Document Revised: 09/30/2015 Document Reviewed: 04/07/2013 Elsevier Interactive Patient Education  [employer].

## 2016-11-07 NOTE — Anesthesia Preprocedure Evaluation (Signed)
Anesthesia Evaluation  Patient identified by MRN, date of birth, ID band Patient awake    Reviewed: Allergy & Precautions, NPO status , Patient's Chart, lab work & pertinent test results, reviewed documented beta blocker date and time   Airway Mallampati: III  TM Distance: >3 FB Neck ROM: Full    Dental  (+) Partial Lower, Upper Dentures   Pulmonary shortness of breath, with exertion, at rest and lying, asthma , sleep apnea and Continuous Positive Airway Pressure Ventilation , COPD,  COPD inhaler,    Pulmonary exam normal breath sounds clear to auscultation + decreased breath sounds      Cardiovascular hypertension, Pt. on medications and Pt. on home beta blockers + CAD, + Past MI, + Peripheral Vascular Disease, +CHF, + Orthopnea and + DOE  Normal cardiovascular exam Rhythm:Regular Rate:Normal  Hx/o PTE   Neuro/Psych  Headaches, PSYCHIATRIC DISORDERS Anxiety Depression Chronic pain syndrome    GI/Hepatic GERD  Medicated and Controlled,Dysphagia   Endo/Other  diabetes, Poorly Controlled, Type 2, Insulin DependentMorbid obesity  Renal/GU Renal InsufficiencyRenal disease Bladder dysfunction  Urinary incontinence    Musculoskeletal  (+) Arthritis , Osteoarthritis,  Gout   Abdominal (+) + obese,   Peds  Hematology  (+) anemia , On Coumadin   Anesthesia Other Findings   Reproductive/Obstetrics                             Anesthesia Physical Anesthesia Plan  ASA: III  Anesthesia Plan: MAC   Post-op Pain Management:    Induction: Intravenous  PONV Risk Score and Plan: 2 and Ondansetron and Propofol  Airway Management Planned: Natural Airway and Nasal Cannula  Additional Equipment:   Intra-op Plan:   Post-operative Plan:   Informed Consent: I have reviewed the patients History and Physical, chart, labs and discussed the procedure including the risks, benefits and alternatives for the  proposed anesthesia with the patient or authorized representative who has indicated his/her understanding and acceptance.     Plan Discussed with: Anesthesiologist, CRNA and Surgeon  Anesthesia Plan Comments:         Anesthesia Quick Evaluation

## 2016-11-07 NOTE — H&P (View-Only) (Signed)
[name] 82 y.o. 13-Dec-1934 [id number]  Assessment & Plan:   Encounter Diagnoses  Name Primary?  . Esophageal dysphagia Yes  . On warfarin therapy   . Type 2 diabetes mellitus with insulin therapy ([place])   . S/P bariatric surgery and reversal      She could have esophageal candidiasis. Her diabetes is out of control. Since she is on warfarin therapy and needs an upper GI endoscopy to evaluate and possibly treat this will need to hold that. He has a history of PE. She tells me she is held her warfarin in the past with no bridge. Will query her primary care provider about this. Can hold warfarin 3 days. Needs to schedule upper GI endoscopy at the hospital due to her comorbidity she's increased risk of problems.The risks and benefits as well as alternatives of endoscopic procedure(s) have been discussed and reviewed. All questions answered. The patient agrees to proceed. I've also reviewed the potential risk of recurrent blood clot and pulmonary embolism off warfarin.  I appreciate the opportunity to care for this patient. CC: Gildardo Cranker, DO   Subjective:   Chief Complaint:  HPI The patient is here with her daughter, she is reporting a four-month history of intermittent solid food dysphagia. For the past month or so she's had a bad cough that she thinks might be getting under control but it still problematic. She denies significant frequent overt heartburn though there is some. She is on a PPI. It's very painful to swallow at times but that sounds like after she has a food impaction. She'll drink water and the food will pass. She is wheelchair-bound with limited walking. She has chronic constipation but that is under control with a stool softener and other regimen. No prior endoscopy or colonoscopy. Allergies  Allergen Reactions  . Sulfonamide Derivatives Swelling    Mouth swelling  . Tramadol Nausea And Vomiting   Current Meds  Medication Sig  . albuterol (PROVENTIL  HFA;VENTOLIN HFA) 108 (90 Base) MCG/ACT inhaler Inhale 1-2 puffs into the lungs every 6 (six) hours as needed for wheezing or shortness of breath.  . anastrozole (ARIMIDEX) 1 MG tablet Take 1 tablet (1 mg total) by mouth daily.  . benzonatate (TESSALON PERLES) 100 MG capsule Take 2 capsules (200 mg total) by mouth 3 (three) times daily as needed for cough.  . budesonide-formoterol (SYMBICORT) 160-4.5 MCG/ACT inhaler Inhale 2 puffs into the lungs 2 (two) times daily. For bronchitis  . cetirizine (ZYRTEC) 10 MG tablet Take 10 mg by mouth at bedtime.  . docusate sodium (COLACE) 100 MG capsule Take 100 mg by mouth daily as needed for mild constipation. TAKE PER BOTTLE AS NEEDED FOR CONSTIPATION   . DULoxetine (CYMBALTA) 60 MG capsule TAKE ONE CAPSULE BY MOUTH ONCE DAILY FOR ANXIETY  . insulin aspart (NOVOLOG FLEXPEN) 100 UNIT/ML FlexPen Inject 30 Units into the skin 2 (two) times daily.  . Insulin Glargine (TOUJEO SOLOSTAR) 300 UNIT/ML SOPN Inject 70 Units into the skin 2 (two) times daily. (before breakfast and before supper)  . isosorbide mononitrate (IMDUR) 60 MG 24 hr tablet TAKE ONE TABLET BY MOUTH ONCE DAILY  . Lancets (ONETOUCH ULTRASOFT) lancets Check blood sugar 2-3 times daily as directed  . losartan (COZAAR) 25 MG tablet Take 1 tablet (25 mg total) by mouth daily.  . meclizine (ANTIVERT) 25 MG tablet Take 1 tablet (25 mg total) by mouth 3 (three) times daily as needed for dizziness.  . metoprolol succinate (TOPROL-XL) 25 MG  24 hr tablet TAKE ONE TABLET BY MOUTH ONCE DAILY FOR BLOOD PRESSURE  . Multiple Vitamins-Minerals (HAIR/SKIN/NAILS/BIOTIN PO) Take 1 tablet by mouth every evening.  . nitroGLYCERIN (NITROSTAT) 0.4 MG SL tablet Take one tablet under the tongue every 5 minutes as needed for chest pain  . ONETOUCH VERIO test strip Use to test blood sugar three times daily. Dx: E11.00  . oxyCODONE-acetaminophen (PERCOCET) 10-325 MG tablet Take 1 tablet by mouth every 6 (six) hours as needed  for pain.  . pantoprazole (PROTONIX) 40 MG tablet TAKE ONE TABLET BY MOUTH ONCE DAILY  . pravastatin (PRAVACHOL) 20 MG tablet TAKE 1 TABLET BY MOUTH ONCE DAILY FOR CHOLESTEROL  . sertraline (ZOLOFT) 50 MG tablet Take 1 tablet (50 mg total) by mouth at bedtime.  . sodium chloride (OCEAN) 0.65 % SOLN nasal spray RINSE AS NEEDED FOR SINUS CONGESTION  . Spacer/Aero-Holding Chambers (AEROCHAMBER PLUS FLO-VU LARGE) MISC 1 each by Other route once.  [name] ULORIC 40 MG tablet TAKE ONE TABLET BY MOUTH ONCE DAILY  . warfarin (COUMADIN) 5 MG tablet Take 1 tablet daily except 1 & 1/2 tablets on Tuesdays and Thursdays   Past Medical History:  Diagnosis Date  . Allergy    takes Mucinex daily as needed  . Anemia, unspecified   . Anxiety    takes Clonazepam daily as needed  . Arthritis   . CHF (congestive heart failure) (H[facility])    takes Furosemide daily  . Coronary artery disease    a. s/p IMI 2004 tx with BMS to RCA;  b. s/p Promus DES to RCA 2/12 (cath: LM 20-30%, pLAD 20-30%, mLAD 50%, RI 30%, pRCA 60%, mRCA 90% - tx with PCI);  c. myoview 1/07: EF 49%, inf scar, no isch  . Coronary atherosclerosis of native coronary artery   . Depression    takes Cymbalta daily  . Diabetes mellitus    insulin daily  . Dysuria   . GERD (gastroesophageal reflux disease)    takes Protonix daily  . Gout, unspecified    takes Colchicine daily  . Headache    occasionally  . History of blood clots about 32yrs ago   in legs-takes Coumadin   . Hyperlipidemia    takes Pravastatin daily  . Hypertension    takes Imdur and Metoprolol daily  . Insomnia   . Joint pain   . Joint swelling   . Myocardial infarction ([place]) 2004  . Numbness   . Obstructive sleep apnea   . Osteoarthritis   . Osteoarthritis   . Osteoarthrosis, unspecified whether generalized or localized, lower leg   . Pain, chronic   . Polymyalgia rheumatica ([place])   . Pulmonary emboli ([place]) 9/13   felt to need lifelong anticoagulation  . Shortness of  breath dyspnea    with exertion and has Albuterol inhaler prn  . Type II or unspecified type diabetes mellitus without mention of complication, uncontrolled   . Urinary incontinence    takes Linzess daily  . Vertigo    hx of;was taking Meclizine if needed   Past Surgical History:  Procedure Laterality Date  . ABDOMINAL HYSTERECTOMY     partial  . APPENDECTOMY    . blood clots/legs and lungs  2013  . BREAST BIOPSY Left 07/22/2014  . BREAST BIOPSY Left 02/10/2013  . BREAST LUMPECTOMY Left 11/05/2014  . BREAST LUMPECTOMY WITH RADIOACTIVE SEED LOCALIZATION Left 11/05/2014   Procedure: LEFT BREAST LUMPECTOMY WITH RADIOACTIVE SEED LOCALIZATION;  Surgeon: [name], MD;  Location: [facility];  Service: General;  Laterality: Left;  . CARDIAC CATHETERIZATION    . COLONOSCOPY    . CORONARY ANGIOPLASTY  2  . EXCISION OF SKIN TAG Right 11/05/2014   Procedure: EXCISION OF RIGHT EYELID SKIN TAG;  Surgeon: [name], MD;  Location: [facility];  Service: General;  Laterality: Right;  . EYE SURGERY Bilateral    cataract   . GASTRIC BYPASS  1977    reversed in 1979, [place] N/A 06/29/2014   Procedure: LEFT HEART CATHETERIZATION WITH CORONARY ANGIOGRAM;  Surgeon: Troy Sine, MD;  Location[facility] CATH LAB;  Service: Cardiovascular;  Laterality: N/A;  . MI with stent placement  2004   Social History   Social History  . Marital status: Widowed    Spouse name: N/A  . Number of children: N/A  . Years of education: N/A   Occupational History  . retired    Social History Main Topics  . Smoking status: Never Smoker  . Smokeless tobacco: Never Used  . Alcohol use No  . Drug use: No  . Sexual activity: Not Currently   Other Topics Concern  . Not on file   Social History Narrative  . No narrative on file   family history includes Arthritis in her father and sister; Breast cancer (age of onset: 61) in her mother; Diabetes in her  father and sister; Heart disease in her cousin and mother; Hypertension in her father; Obesity in her sister; Throat cancer in her father.   Review of Systems As per history of present illness. Also notable for depressed mood and anxiety at times decreased hearing dyspnea. All other review of systems negative  Objective:   Physical Exam @BP  130/68   Pulse 66   Ht 5\' 7"  (1.702 m)   Wt (!) 333 lb (151 kg) Comment: pt unable to get on scale.  weight was pt reported.  BMI 52.16 kg/m @  General:  Well-developed, well-nourished and in no acute distress Eyes:  anicteric. ENT:   Mouth and posterior pharynx free of lesions. dentures Neck:   supple w/o thyromegaly or mass.  Lungs: Clear to auscultation bilaterally. Heart:  S1S2, no rubs, murmurs, gallops. Abdomen: obese soft, non-tender Lymph:  no cervical or supraclavicular adenopathy. Extremities:   no edema, cyanosis or clubbing Skin   no rash. Neuro:  A&O x 3.  Psych:  appropriate mood and  Affect.   Data Reviewed: Hemoglobin A1c was 12% her teen days ago. 2017 CT of the chest demonstrated some postoperative changes in her stomach from prior area check surgery and reversal.

## 2016-11-07 NOTE — Anesthesia Postprocedure Evaluation (Signed)
Anesthesia Post Note  Patient: [name]  Procedure(s) Performed: Procedure(s) (LRB): ESOPHAGOGASTRODUODENOSCOPY (EGD) WITH PROPOFOL (N/A)     Patient location during evaluation: PACU Anesthesia Type: MAC Level of consciousness: awake and alert and oriented Pain management: pain level controlled Vital Signs Assessment: post-procedure vital signs reviewed and stable Respiratory status: spontaneous breathing, nonlabored ventilation and respiratory function stable Cardiovascular status: stable and blood pressure returned to baseline Postop Assessment: no signs of nausea or vomiting Anesthetic complications: no    Last Vitals:  Vitals:   11/07/16 1320 11/07/16 1330  BP: (!) 133/44 116/80  Pulse: 83 78  Resp: 18 17  Temp:      Last Pain:  Vitals:   11/07/16 1307  TempSrc: Oral                 Angelette Ganus A.

## 2016-11-07 NOTE — Transfer of Care (Signed)
Immediate Anesthesia Transfer of Care Note  Patient: [name]  Procedure(s) Performed: Procedure(s): ESOPHAGOGASTRODUODENOSCOPY (EGD) WITH PROPOFOL (N/A)  Patient Location: PACU  Anesthesia Type:General  Level of Consciousness:  sedated, patient cooperative and responds to stimulation  Airway & Oxygen Therapy:Patient Spontanous Breathing and Patient connected to face mask oxgen  Post-op Assessment:  Report given to PACU RN and Post -op Vital signs reviewed and stable  Post vital signs:  Reviewed and stable  Last Vitals:  Vitals:   11/07/16 1131  BP: (!) 171/81  Resp: 14  Temp: 57.3 C    Complications: No apparent anesthesia complications

## 2016-11-09 ENCOUNTER — Encounter (HOSPITAL_COMMUNITY): Payer: Self-pay | Admitting: Internal Medicine

## 2016-11-10 ENCOUNTER — Telehealth: Payer: Self-pay

## 2016-11-10 DIAGNOSIS — I2782 Chronic pulmonary embolism: Secondary | ICD-10-CM | POA: Diagnosis not present

## 2016-11-10 MED ORDER — UNABLE TO FIND: 0 refills | Status: DC

## 2016-11-10 NOTE — Telephone Encounter (Signed)
Spoke with [name] and told her that Dr. Eulas Post recommended that she stops taking the lovenox and starting taking her coumadin and that she needs to go to ED or urgent care to get a STAT INR. [name] then stated that the patient was taking both coumadin and lovenox. Place pam on a brief hold and spoke with Dr. Eulas Post about patient taking both and she told me tell the patient to stop the lovenox and start the coumadin and got he ED or urgent care. I got back on the phone with pam and told her what Dr. Eulas Post advised and she stated that she would take her to have it done. And that she would call us back with the results

## 2016-11-10 NOTE — Telephone Encounter (Signed)
[name] called indicating that they just arrived in [place], [place] and called Urgent Care. Urgent Care requested that we send them an order for PT/INR.  Order faxed to [phone], along with snapshot and current medication list. Phone number [phone]

## 2016-11-10 NOTE — Telephone Encounter (Signed)
She should no longer be taking lovenox. She should be back on her coumadin post procedure! She needs STAT INR. Recommend she goes to ER or local UC that can process INR.

## 2016-11-10 NOTE — Telephone Encounter (Signed)
Patient left a voicemail stating that she is taking lovenox and she stated that she had a raised area in her stomach the size of a egg. Onset on Monday denies any pain at the moment. No available appointments please advise.

## 2016-11-10 NOTE — Addendum Note (Signed)
Addended by: [place] Bores on: 11/10/2016 04:52 PM   Modules accepted: Orders

## 2016-11-10 NOTE — Telephone Encounter (Signed)
Tried to call the patient back to give her Dr. Saralyn Pilar recommendation. But she has a voicemail that isn't set up. Will try to call back later.

## 2016-11-13 ENCOUNTER — Other Ambulatory Visit: Payer: Self-pay

## 2016-11-13 DIAGNOSIS — G894 Chronic pain syndrome: Secondary | ICD-10-CM

## 2016-11-13 MED ORDER — OXYCODONE-ACETAMINOPHEN 10-325 MG PO TABS: 1.0000 | ORAL_TABLET | Freq: Four times a day (QID) | ORAL | 0 refills | Status: DC | PRN

## 2016-11-13 NOTE — Telephone Encounter (Signed)
Pt called requesting refill on Oxycodone, last refill 10/09/16

## 2016-11-14 ENCOUNTER — Ambulatory Visit: Payer: PPO | Admitting: Internal Medicine

## 2016-11-14 ENCOUNTER — Ambulatory Visit: Payer: Self-pay | Admitting: Internal Medicine

## 2016-11-14 LAB — POCT INR: INR: 1.9 — AB (ref 0.9–1.1)

## 2016-11-14 LAB — PROTIME-INR: Protime: 18.9 — AB (ref 10.0–13.8)

## 2016-11-15 ENCOUNTER — Telehealth: Payer: Self-pay

## 2016-11-15 ENCOUNTER — Ambulatory Visit (INDEPENDENT_AMBULATORY_CARE_PROVIDER_SITE_OTHER): Payer: PPO | Admitting: Internal Medicine

## 2016-11-15 ENCOUNTER — Encounter: Payer: Self-pay | Admitting: Internal Medicine

## 2016-11-15 VITALS — BP 128/72 | HR 82 | Ht 67.0 in | Wt 346.8 lb

## 2016-11-15 DIAGNOSIS — Z794 Long term (current) use of insulin: Secondary | ICD-10-CM

## 2016-11-15 DIAGNOSIS — E1165 Type 2 diabetes mellitus with hyperglycemia: Secondary | ICD-10-CM

## 2016-11-15 DIAGNOSIS — IMO0002 Reserved for concepts with insufficient information to code with codable children: Secondary | ICD-10-CM

## 2016-11-15 DIAGNOSIS — Z7901 Long term (current) use of anticoagulants: Secondary | ICD-10-CM | POA: Diagnosis not present

## 2016-11-15 DIAGNOSIS — K2100 Gastro-esophageal reflux disease with esophagitis, without bleeding: Secondary | ICD-10-CM | POA: Insufficient documentation

## 2016-11-15 DIAGNOSIS — R413 Other amnesia: Secondary | ICD-10-CM | POA: Diagnosis not present

## 2016-11-15 DIAGNOSIS — E1121 Type 2 diabetes mellitus with diabetic nephropathy: Secondary | ICD-10-CM

## 2016-11-15 DIAGNOSIS — J410 Simple chronic bronchitis: Secondary | ICD-10-CM | POA: Diagnosis not present

## 2016-11-15 DIAGNOSIS — D229 Melanocytic nevi, unspecified: Secondary | ICD-10-CM | POA: Insufficient documentation

## 2016-11-15 DIAGNOSIS — L299 Pruritus, unspecified: Secondary | ICD-10-CM | POA: Diagnosis not present

## 2016-11-15 DIAGNOSIS — F0390 Unspecified dementia without behavioral disturbance: Secondary | ICD-10-CM | POA: Insufficient documentation

## 2016-11-15 HISTORY — DX: Melanocytic nevi, unspecified: D22.9

## 2016-11-15 LAB — POCT INR: INR: 1.4

## 2016-11-15 NOTE — Telephone Encounter (Signed)
Patients form mailed and extra placed in my mailbox ([name])

## 2016-11-15 NOTE — Progress Notes (Signed)
Patient ID: [name], female   DOB: [birth year], 82 y.o.   MRN: [id number]    Location:  [name] Place of Service: OFFICE  Chief Complaint  Patient presents with  . Medical Management of Chronic Issues    1 month routine visit    HPI:  82 yo female seen today for f/u bronchitis and DM. She is using symbicort (red HFA) and a "yellow one". She has not needed grey HFA (ventolin) since using symbicort BID. She c/o itching all over but has not seen a rash. Itching improves with benadryl prn. She also is c/a multiple moles that are itchy.  She underwent EGD 11/07/16 and revealed tortuous esophagus s/p esophageal dilation. She had benign appearing stenosis of gastric body (from previous gastric bypass take down) that was traversed. Some pill granules was noted behind the stenosed area.   She is back on coumadin. She developed hematoma at lovenox injection site last week. She went to UC in [place] and had INR drawn but we have not rec'd those results. She has not missed any doses.  DM - BS 89 this AM but has been fluctuating. She was out of insulin x 1 week but resumed it about 2 weeks ago. Daughter has missplaced Rx assistance forms for [employer]. She gets Novolog 30 units BID; Toujeo 70 units BID. She has polyuria, polydipsia, polyphagia.   Chronic bronchitis - improved SOB on symbicort  Pt is a poor historian due to dementia. Hx obtained from chart and daughter  Past Medical History:  Diagnosis Date  . Allergy    takes Mucinex daily as needed  . Anemia, unspecified   . Anxiety    takes Clonazepam daily as needed  . Arthritis   . CHF (congestive heart failure) (HCC)    takes Furosemide daily  . Coronary artery disease    a. s/p IMI 2004 tx with BMS to RCA;  b. s/p Promus DES to RCA 2/12 (cath: LM 20-30%, pLAD 20-30%, mLAD 50%, RI 30%, pRCA 60%, mRCA 90% - tx with PCI);  c. myoview 1/07: EF 49%, inf scar, no isch  . Coronary atherosclerosis of native coronary artery   . Depression    takes Cymbalta daily  . Diabetes mellitus    insulin daily  . Dysuria   . GERD (gastroesophageal reflux disease)    takes Protonix daily  . Gout, unspecified    takes Colchicine daily  . Headache    occasionally  . History of blood clots about 73yr ago   in legs-takes Coumadin   . Hyperlipidemia    takes Pravastatin daily  . Hypertension    takes Imdur and Metoprolol daily  . Insomnia   . Joint pain   . Joint swelling   . Myocardial infarction ([facility] 2004  . Numbness   . Obstructive sleep apnea   . Osteoarthritis   . Osteoarthritis   . Osteoarthrosis, unspecified whether generalized or localized, lower leg   . Pain, chronic   . Polymyalgia rheumatica ([facility]   . Pulmonary emboli ([place] 9/13   felt to need lifelong anticoagulation  . Shortness of breath dyspnea    with exertion and has Albuterol inhaler prn  . Type II or unspecified type diabetes mellitus without mention of complication, uncontrolled   . Urinary incontinence    takes Linzess daily  . Vertigo    hx of;was taking Meclizine if needed    Past Surgical History:  Procedure Laterality Date  . ABDOMINAL HYSTERECTOMY  partial  . APPENDECTOMY    . blood clots/legs and lungs  2013  . BREAST BIOPSY Left 07/22/2014  . BREAST BIOPSY Left 02/10/2013  . BREAST LUMPECTOMY Left 11/05/2014  . BREAST LUMPECTOMY WITH RADIOACTIVE SEED LOCALIZATION Left 11/05/2014   Procedure: LEFT BREAST LUMPECTOMY WITH RADIOACTIVE SEED LOCALIZATION;  Surgeon: [name], MD;  Location: [facility];  Service: General;  Laterality: Left;  . CARDIAC CATHETERIZATION    . COLONOSCOPY    . CORONARY ANGIOPLASTY  2  . ESOPHAGOGASTRODUODENOSCOPY (EGD) WITH PROPOFOL N/A 11/07/2016   Procedure: ESOPHAGOGASTRODUODENOSCOPY (EGD) WITH PROPOFOL;  Surgeon: Gatha Mayer, MD;  Location: [facility];  Service: Endoscopy;  Laterality: N/A;  . EXCISION OF SKIN TAG Right 11/05/2014   Procedure: EXCISION OF RIGHT EYELID SKIN TAG;  Surgeon: [name],  MD;  Location: [name];  Service: General;  Laterality: Right;  . EYE SURGERY Bilateral    cataract   . GASTRIC BYPASS  1977    reversed in 1979, [place] N/A 06/29/2014   Procedure: LEFT HEART CATHETERIZATION WITH CORONARY ANGIOGRAM;  Surgeon: Troy Sine, MD;  Location: [facility] CATH LAB;  Service: Cardiovascular;  Laterality: N/A;  . MI with stent placement  2004    Patient Care Team: Gildardo Cranker, DO as PCP - General (Internal Medicine) [name], [name], MD (Inactive) as Consulting Physician (Cardiology) [name], MD as Consulting Physician (Ophthalmology) [name], MD as Consulting Physician (General Surgery) [name], MD as Referring Physician (Obstetrics and Gynecology)  Social History   Social History  . Marital status: Widowed    Spouse name: N/A  . Number of children: N/A  . Years of education: N/A   Occupational History  . retired    Social History Main Topics  . Smoking status: Never Smoker  . Smokeless tobacco: Never Used  . Alcohol use No  . Drug use: No  . Sexual activity: Not Currently   Other Topics Concern  . Not on file   Social History Narrative  . No narrative on file     reports that she has never smoked. She has never used smokeless tobacco. She reports that she does not drink alcohol or use drugs.  Family History  Problem Relation Age of Onset  . Breast cancer Mother 6  . Heart disease Mother   . Throat cancer Father   . Hypertension Father   . Arthritis Father   . Diabetes Father   . Arthritis Sister   . Obesity Sister   . Diabetes Sister   . Heart disease Cousin   . Colon cancer Neg Hx   . Stomach cancer Neg Hx   . Esophageal cancer Neg Hx    Family Status  Relation Status  . Mother Deceased at age 26       Cause of Death: Breast cancer  . Father Deceased at age 32       Cause of Death: Complications of diabetes & HTN  . Sister Deceased at age 55  .  Sister Alive  . Daughter Alive  . Son Alive  . Sister Alive  . Sister Alive  . Brother Alive  . Daughter Alive  . MGM Deceased  . MGF Deceased  . PGM Deceased  . PGF Deceased  . Cousin Deceased  . Neg Hx (Not Specified)     Allergies  Allergen Reactions  . Sulfonamide Derivatives Swelling    Mouth swelling  . Tramadol Nausea And Vomiting  Medications: Patient's Medications  New Prescriptions   No medications on file  Previous Medications   ALBUTEROL (PROVENTIL HFA;VENTOLIN HFA) 108 (90 BASE) MCG/ACT INHALER    Inhale 1-2 puffs into the lungs every 6 (six) hours as needed for wheezing or shortness of breath.   ANASTROZOLE (ARIMIDEX) 1 MG TABLET    Take 1 tablet (1 mg total) by mouth daily.   BENZONATATE (TESSALON PERLES) 100 MG CAPSULE    Take 2 capsules (200 mg total) by mouth 3 (three) times daily as needed for cough.   BUDESONIDE-FORMOTEROL (SYMBICORT) 160-4.5 MCG/ACT INHALER    Inhale 2 puffs into the lungs 2 (two) times daily. For bronchitis   CETIRIZINE (ZYRTEC) 10 MG TABLET    Take 10 mg by mouth at bedtime.   DOCUSATE SODIUM (COLACE) 100 MG CAPSULE    Take 100 mg by mouth daily as needed for mild constipation. TAKE PER BOTTLE AS NEEDED FOR CONSTIPATION    DULOXETINE (CYMBALTA) 60 MG CAPSULE    TAKE ONE CAPSULE BY MOUTH ONCE DAILY FOR ANXIETY   INSULIN ASPART (NOVOLOG FLEXPEN) 100 UNIT/ML FLEXPEN    Inject 30 Units into the skin 2 (two) times daily.   INSULIN GLARGINE (TOUJEO SOLOSTAR) 300 UNIT/ML SOPN    Inject 70 Units into the skin 2 (two) times daily. (before breakfast and before supper)   ISOSORBIDE MONONITRATE (IMDUR) 60 MG 24 HR TABLET    TAKE ONE TABLET BY MOUTH ONCE DAILY   LANCETS (ONETOUCH ULTRASOFT) LANCETS    Check blood sugar 2-3 times daily as directed   LOSARTAN (COZAAR) 25 MG TABLET    Take 1 tablet (25 mg total) by mouth daily.   MECLIZINE (ANTIVERT) 25 MG TABLET    Take 1 tablet (25 mg total) by mouth 3 (three) times daily as needed for dizziness.    METOPROLOL SUCCINATE (TOPROL-XL) 25 MG 24 HR TABLET    TAKE ONE TABLET BY MOUTH ONCE DAILY FOR BLOOD PRESSURE   NITROGLYCERIN (NITROSTAT) 0.4 MG SL TABLET    Take one tablet under the tongue every 5 minutes as needed for chest pain   ONETOUCH VERIO TEST STRIP    Use to test blood sugar three times daily. Dx: E11.00   OXYCODONE-ACETAMINOPHEN (PERCOCET) 10-325 MG TABLET    Take 1 tablet by mouth every 6 (six) hours as needed for pain.   PANTOPRAZOLE (PROTONIX) 40 MG TABLET    TAKE ONE TABLET BY MOUTH ONCE DAILY   PRAVASTATIN (PRAVACHOL) 20 MG TABLET    TAKE 1 TABLET BY MOUTH ONCE DAILY FOR CHOLESTEROL   SODIUM CHLORIDE (OCEAN) 0.65 % SOLN NASAL SPRAY    Place 1 spray into both nostrils 3 (three) times daily as needed (for sinus congestion.). RINSE AS NEEDED FOR SINUS CONGESTION    SPACER/AERO-HOLDING CHAMBERS (AEROCHAMBER PLUS FLO-VU LARGE) MISC    1 each by Other route once.   ULORIC 40 MG TABLET    TAKE ONE TABLET BY MOUTH ONCE DAILY   UNABLE TO FIND    PT/INR check and advise, Recommended Range 2-3, DX : I27.82, Fax results to Dr.Kassadie Pancake [phone]   WARFARIN (COUMADIN) 5 MG TABLET    Take 1 tablet daily except 1 & 1/2 tablets on Tuesdays and Thursdays  Modified Medications   No medications on file  Discontinued Medications   No medications on file    Review of Systems  Unable to perform ROS: Dementia    Vitals:   11/15/16 0853  BP: 128/72  Pulse: 82  SpO2:  97%  Weight: (!) 346 lb 12.8 oz (157.3 kg)  Height: '5\' 7"'  (1.702 m)   Body mass index is 54.32 kg/m.  Physical Exam  Constitutional: She appears well-developed and well-nourished.  Sitting in w/c in NAD, morbidly obese  HENT:  Mouth/Throat: Oropharynx is clear and moist. No oropharyngeal exudate.  MMM; no oral thrush  Eyes: Pupils are equal, round, and reactive to light. No scleral icterus.  Neck: Neck supple. Carotid bruit is not present.  Cardiovascular: Normal rate, regular rhythm and intact distal pulses.  Exam  reveals no gallop and no friction rub.   Murmur (2/6 SEM) heard. Trace LE edema b/l. No calf TTP  Pulmonary/Chest: Effort normal and breath sounds normal. No respiratory distress. She has no wheezes. She has no rales.  Abdominal: Soft. Normal appearance and bowel sounds are normal. She exhibits no distension and no mass. There is no hepatomegaly. There is tenderness. There is no rigidity, no rebound and no guarding. No hernia.  obese  Musculoskeletal: She exhibits edema.  Lymphadenopathy:    She has no cervical adenopathy.  Neurological: She is alert.  Skin: Skin is warm and dry. No rash noted.  Multiple nevi but none appear grossly dysplastic  Psychiatric: She has a normal mood and affect. Her behavior is normal.     Labs reviewed: Abstract on 11/14/2016  Component Date Value Ref Range Status  . INR 11/14/2016 1.9* 0.9 - 1.1 Final  . Protime 11/14/2016 18.9* 10.0 - 13.8 Final  Admission on 11/07/2016, Discharged on 11/07/2016  Component Date Value Ref Range Status  . Glucose-Capillary 11/07/2016 92  65 - 99 mg/dL Final  Office Visit on 11/06/2016  Component Date Value Ref Range Status  . INR 11/06/2016 2.8   Final  Orders Only on 10/19/2016  Component Date Value Ref Range Status  . Organism ID, Bacteria 10/19/2016    Final                   Value:Three or more organisms present,each greater than 10,000 CFU/mL.These organisms,commonly found on external and internal genitalia,are considered to be colonizers.No further testing performed.   Lab on 10/19/2016  Component Date Value Ref Range Status  . Color, Urine 10/19/2016 YELLOW  YELLOW Final  . APPearance 10/19/2016 CLEAR  CLEAR Final  . Specific Gravity, Urine 10/19/2016 1.008  1.001 - 1.035 Final  . pH 10/19/2016 6.0  5.0 - 8.0 Final  . Glucose, UA 10/19/2016 NEGATIVE  NEGATIVE Final  . Bilirubin Urine 10/19/2016 NEGATIVE  NEGATIVE Final  . Ketones, ur 10/19/2016 NEGATIVE  NEGATIVE Final  . Hgb urine dipstick 10/19/2016  TRACE* NEGATIVE Final  . Protein, ur 10/19/2016 NEGATIVE  NEGATIVE Final  . Nitrite 10/19/2016 NEGATIVE  NEGATIVE Final  . Leukocytes, UA 10/19/2016 2+* NEGATIVE Final  . WBC, UA 10/19/2016 40-60* <=5 WBC/HPF Final  . RBC / HPF 10/19/2016 NONE SEEN  <=2 RBC/HPF Final  . Squamous Epithelial / LPF 10/19/2016 NONE SEEN  <=5 HPF Final  . Bacteria, UA 10/19/2016 NONE SEEN  NONE SEEN HPF Final  . Crystals 10/19/2016 NONE SEEN  NONE SEEN HPF Final  . Casts 10/19/2016 NONE SEEN  NONE SEEN LPF Final  . Yeast 10/19/2016 NONE SEEN  NONE SEEN HPF Final  Office Visit on 10/13/2016  Component Date Value Ref Range Status  . Cholesterol 10/13/2016 99  <200 mg/dL Final  . Triglycerides 10/13/2016 118  <150 mg/dL Final  . HDL 10/13/2016 34* >50 mg/dL Final  . Total CHOL/HDL Ratio 10/13/2016 2.9  <  5.0 Ratio Final  . VLDL 10/13/2016 24  <30 mg/dL Final  . LDL Cholesterol 10/13/2016 41  <100 mg/dL Final  . Hgb A1c MFr Bld 10/13/2016 12.1* <5.7 % Final   Comment:   For someone without known diabetes, a hemoglobin A1c value of 6.5% or greater indicates that they may have diabetes and this should be confirmed with a follow-up test.   For someone with known diabetes, a value <7% indicates that their diabetes is well controlled and a value greater than or equal to 7% indicates suboptimal control. A1c targets should be individualized based on duration of diabetes, age, comorbid conditions, and other considerations.   Currently, no consensus exists for use of hemoglobin A1c for diagnosis of diabetes for children.     . Mean Plasma Glucose 10/13/2016 301  mg/dL Final  . Sodium 10/13/2016 131* 135 - 146 mmol/L Final  . Potassium 10/13/2016 4.7  3.5 - 5.3 mmol/L Final  . Chloride 10/13/2016 97* 98 - 110 mmol/L Final  . CO2 10/13/2016 26  20 - 31 mmol/L Final  . Glucose, Bld 10/13/2016 408* 65 - 99 mg/dL Final   Result repeated and verified.  . BUN 10/13/2016 20  7 - 25 mg/dL Final  . Creat 10/13/2016  1.25* 0.60 - 0.93 mg/dL Final   Comment:   For patients > or = 80 years of age: The upper reference limit for Creatinine is approximately 13% higher for people identified as African-American.     . Calcium 10/13/2016 8.7  8.6 - 10.4 mg/dL Final  Office Visit on 10/09/2016  Component Date Value Ref Range Status  . INR 10/09/2016 3.1   Final  Lab on 09/25/2016  Component Date Value Ref Range Status  . Prothrombin Time 09/25/2016 32.6* 9.0 - 11.5 sec Final   Comment:   For more information on this test, go to: http://education.[facility].com/faq/FAQ104     . INR 09/25/2016 3.2*  Final   Comment:   Reference Range                        0.9-1.1 Moderate-intensity Warfarin Therapy    2.0-3.0 Higher-intensity Warfarin Therapy      3.0-4.0     . Sodium 09/25/2016 135  135 - 146 mmol/L Final  . Potassium 09/25/2016 4.9  3.5 - 5.3 mmol/L Final  . Chloride 09/25/2016 99  98 - 110 mmol/L Final  . CO2 09/25/2016 23  20 - 31 mmol/L Final  . Glucose, Bld 09/25/2016 285* 65 - 99 mg/dL Final  . BUN 09/25/2016 19  7 - 25 mg/dL Final  . Creat 09/25/2016 1.31* 0.60 - 0.93 mg/dL Final   Comment:   For patients > or = 80 years of age: The upper reference limit for Creatinine is approximately 13% higher for people identified as African-American.     . Calcium 09/25/2016 8.8  8.6 - 10.4 mg/dL Final  . GFR, Est African American 09/25/2016 45* >=60 mL/min Final  . GFR, Est Non African American 09/25/2016 39* >=60 mL/min Final  Appointment on 09/20/2016  Component Date Value Ref Range Status  . WBC 09/20/2016 10.7* 3.9 - 10.3 10e3/uL Final  . NEUT# 09/20/2016 6.9* 1.5 - 6.5 10e3/uL Final  . HGB 09/20/2016 8.9* 11.6 - 15.9 g/dL Final  . HCT 09/20/2016 29.1* 34.8 - 46.6 % Final  . Platelets 09/20/2016 282  145 - 400 10e3/uL Final  . MCV 09/20/2016 87.1  79.5 - 101.0 fL Final  . [facility] 09/20/2016  26.6  25.1 - 34.0 pg Final  . MCHC 09/20/2016 30.6* 31.5 - 36.0 g/dL Final  . RBC 09/20/2016  3.34* 3.70 - 5.45 10e6/uL Final  . RDW 09/20/2016 14.8* 11.2 - 14.5 % Final  . lymph# 09/20/2016 2.8  0.9 - 3.3 10e3/uL Final  . MONO# 09/20/2016 0.6  0.1 - 0.9 10e3/uL Final  . Eosinophils Absolute 09/20/2016 0.4  0.0 - 0.5 10e3/uL Final  . Basophils Absolute 09/20/2016 0.0  0.0 - 0.1 10e3/uL Final  . NEUT% 09/20/2016 64.6  38.4 - 76.8 % Final  . LYMPH% 09/20/2016 26.5  14.0 - 49.7 % Final  . MONO% 09/20/2016 5.4  0.0 - 14.0 % Final  . EOS% 09/20/2016 3.3  0.0 - 7.0 % Final  . BASO% 09/20/2016 0.2  0.0 - 2.0 % Final  . Retic % 09/20/2016 2.17* 0.70 - 2.10 % Final  . Retic Ct Abs 09/20/2016 72.48  33.70 - 90.70 10e3/uL Final  . Immature Retic Fract 09/20/2016 17.30* 1.60 - 10.00 % Final  . Sodium 09/20/2016 135* 136 - 145 mEq/L Final  . Potassium 09/20/2016 5.9 No visable hemolysis* 3.5 - 5.1 mEq/L Final  . Chloride 09/20/2016 102  98 - 109 mEq/L Final  . CO2 09/20/2016 24  22 - 29 mEq/L Final  . Glucose 09/20/2016 398* 70 - 140 mg/dl Final   Glucose reference range is for nonfasting patients. Fasting glucose reference range is 70- 100.  [name] BUN 09/20/2016 24.4  7.0 - 26.0 mg/dL Final  . Creatinine 09/20/2016 1.4* 0.6 - 1.1 mg/dL Final  . Total Bilirubin 09/20/2016 0.44  0.20 - 1.20 mg/dL Final  . Alkaline Phosphatase 09/20/2016 92  40 - 150 U/L Final  . AST 09/20/2016 11  5 - 34 U/L Final  . ALT 09/20/2016 12  0 - 55 U/L Final  . Total Protein 09/20/2016 7.5  6.4 - 8.3 g/dL Final  . Albumin 09/20/2016 2.8* 3.5 - 5.0 g/dL Final  . Calcium 09/20/2016 9.1  8.4 - 10.4 mg/dL Final  . Anion Gap 09/20/2016 9  3 - 11 mEq/L Final  . EGFR 09/20/2016 41* >90 ml/min/1.73 m2 Final   eGFR is calculated using the CKD-EPI Creatinine Equation (2009)  Office Visit on 09/11/2016  Component Date Value Ref Range Status  . INR 09/11/2016 4.6   Final  There may be more visits with results that are not included.    No results found.   Assessment/Plan   ICD-10-CM   1. Simple chronic bronchitis (HCC)  J41.0   2. Long term current use of anticoagulant therapy Z79.01 POC INR  3. Uncontrolled type 2 diabetes mellitus with diabetic nephropathy, with long-term current use of insulin (HCC) E11.21    E11.65    Z79.4   4. GERD with esophagitis K21.0   5. Memory loss R41.3   6. Itching L29.9   7. Multiple benign nevi D22.9     INR rechecked today - INR 1.4. CHANGE COUMADIN TO TAKE 1 AND 1/2 TABLETS EVERY EVENING X 3 DOSES (today, tomorrow and Friday) THEN RESUME NORMAL SCHEDULE (1 tablet every evening except take 1 and 1/2 tablet on Tuesdays and Thursdays). Repeat fingerstick INR in the office by nurse visit in 1 week  Continue benadryl as needed for itching  Will consider dermatology referral once blood sugars improved  rx assistance forms for [facility] printed and physician portion completed. Form mailed to pt to complete and copy placed into chart  Continue other medications as ordered  Follow up in  1 month for DM, GERD, bronchitis, DVT  Keep appt with Tivis Ringer for Coumadin [employer]. Perlie Gold  [facility] [address] [place], [place] [address] [phone] Cell (Monday-Friday 8 AM - 5 PM) [phone] After 5 PM and follow prompts

## 2016-11-15 NOTE — Telephone Encounter (Signed)
Patient assistance form started and signed by Dr. Eulas Post, Mailed to patient for signature and income verification. Placed sticky note on form to inform patient to drop form back off to office to be faxed.

## 2016-11-15 NOTE — Patient Instructions (Addendum)
INR rechecked today - INR 1.4. CHANGE COUMADIN TO TAKE 1 AND 1/2 TABLETS EVERY EVENING X 3 DOSES (today, tomorrow and Friday) THEN RESUME NORMAL SCHEDULE (1 tablet every evening except take 1 and 1/2 tablet on Tuesdays and Thursdays). Repeat fingerstick INR in the office by nurse visit in 1 week  Continue benadryl as needed for itching  Will consider dermatology referral once blood sugars improved  rx assistance forms for Toujeo printed and given to patient today  Continue other medications as ordered  Follow up in 1 month for DM, GERD, bronchitis, DVT

## 2016-11-22 ENCOUNTER — Ambulatory Visit (INDEPENDENT_AMBULATORY_CARE_PROVIDER_SITE_OTHER): Payer: PPO | Admitting: Nurse Practitioner

## 2016-11-22 ENCOUNTER — Encounter: Payer: Self-pay | Admitting: Nurse Practitioner

## 2016-11-22 VITALS — BP 132/72 | HR 81 | Resp 17

## 2016-11-22 DIAGNOSIS — I2782 Chronic pulmonary embolism: Secondary | ICD-10-CM

## 2016-11-22 DIAGNOSIS — Z7901 Long term (current) use of anticoagulants: Secondary | ICD-10-CM

## 2016-11-22 LAB — POCT INR: INR: 2

## 2016-11-22 NOTE — Patient Instructions (Signed)
KEEP FOLLOW UP WITH CATHEY CONT CURRENT DOSE OF COUMADIN

## 2016-11-22 NOTE — Progress Notes (Signed)
Careteam: Patient Care Team: Gildardo Cranker, DO as PCP - General (Internal Medicine) [name], [name], MD (Inactive) as Consulting Physician (Cardiology) [name], MD as Consulting Physician (Ophthalmology) [name], MD as Consulting Physician (General Surgery) [name], MD as Referring Physician (Obstetrics and Gynecology)  Advanced Directive information Does Patient Have a Medical Advance Directive?: No  Allergies  Allergen Reactions  . Sulfonamide Derivatives Swelling    Mouth swelling  . Tramadol Nausea And Vomiting    Chief Complaint  Patient presents with  . Follow-up    Pt is being seen for an INR recheck. Previous 1.4 on 11/15/16. Current     HPI: Patient is a 82 y.o. female seen in the office today for INR check. Pt taking coumadin due to chronic PE.  Last INR was 1.4 therefore coumadin was increased for 3 doses then she was to resume previous dosing.  She is now taking coumadin 5 mg daily except 7.5 mg on Tuesday and thursdays.  Current INR 2.0  bruising from Lovenox needles, otherwise nothing unusal. Complaint with medication.  No signs of recurrent PE or DVT  Review of Systems:  Review of Systems  Constitutional: Negative for chills, fever and malaise/fatigue.  Respiratory: Negative for hemoptysis and shortness of breath.   Cardiovascular: Negative for chest pain.  Gastrointestinal: Negative for blood in stool.  Genitourinary: Negative for hematuria.  Endo/Heme/Allergies: Bruises/bleeds easily.  Psychiatric/Behavioral: Positive for memory loss.    Past Medical History:  Diagnosis Date  . Allergy    takes Mucinex daily as needed  . Anemia, unspecified   . Anxiety    takes Clonazepam daily as needed  . Arthritis   . CHF (congestive heart failure) (HCC)    takes Furosemide daily  . Coronary artery disease    a. s/p IMI 2004 tx with BMS to RCA;  b. s/p Promus DES to RCA 2/12 (cath: LM 20-30%, pLAD 20-30%, mLAD 50%, RI 30%, pRCA 60%,  mRCA 90% - tx with PCI);  c. myoview 1/07: EF 49%, inf scar, no isch  . Coronary atherosclerosis of native coronary artery   . Depression    takes Cymbalta daily  . Diabetes mellitus    insulin daily  . Dysuria   . GERD (gastroesophageal reflux disease)    takes Protonix daily  . Gout, unspecified    takes Colchicine daily  . Headache    occasionally  . History of blood clots about 28yrs ago   in legs-takes Coumadin   . Hyperlipidemia    takes Pravastatin daily  . Hypertension    takes Imdur and Metoprolol daily  . Insomnia   . Joint pain   . Joint swelling   . Myocardial infarction ([place]) 2004  . Numbness   . Obstructive sleep apnea   . Osteoarthritis   . Osteoarthritis   . Osteoarthrosis, unspecified whether generalized or localized, lower leg   . Pain, chronic   . Polymyalgia rheumatica ([place])   . Pulmonary emboli ([place]) 9/13   felt to need lifelong anticoagulation  . Shortness of breath dyspnea    with exertion and has Albuterol inhaler prn  . Type II or unspecified type diabetes mellitus without mention of complication, uncontrolled   . Urinary incontinence    takes Linzess daily  . Vertigo    hx of;was taking Meclizine if needed   Past Surgical History:  Procedure Laterality Date  . ABDOMINAL HYSTERECTOMY     partial  . APPENDECTOMY    . blood  clots/legs and lungs  2013  . BREAST BIOPSY Left 07/22/2014  . BREAST BIOPSY Left 02/10/2013  . BREAST LUMPECTOMY Left 11/05/2014  . BREAST LUMPECTOMY WITH RADIOACTIVE SEED LOCALIZATION Left 11/05/2014   Procedure: LEFT BREAST LUMPECTOMY WITH RADIOACTIVE SEED LOCALIZATION;  Surgeon: [name], MD;  Location: [facility];  Service: General;  Laterality: Left;  . CARDIAC CATHETERIZATION    . COLONOSCOPY    . CORONARY ANGIOPLASTY  2  . ESOPHAGOGASTRODUODENOSCOPY (EGD) WITH PROPOFOL N/A 11/07/2016   Procedure: ESOPHAGOGASTRODUODENOSCOPY (EGD) WITH PROPOFOL;  Surgeon: Gatha Mayer, MD;  Location: [facility];  Service:  Endoscopy;  Laterality: N/A;  . EXCISION OF SKIN TAG Right 11/05/2014   Procedure: EXCISION OF RIGHT EYELID SKIN TAG;  Surgeon: [name], MD;  Location: [facility];  Service: General;  Laterality: Right;  . EYE SURGERY Bilateral    cataract   . GASTRIC BYPASS  1977    reversed in 1979, [place] N/A 06/29/2014   Procedure: LEFT HEART CATHETERIZATION WITH CORONARY ANGIOGRAM;  Surgeon: Troy Sine, MD;  Location: [facility];  Service: Cardiovascular;  Laterality: N/A;  . MI with stent placement  2004   Social History:   reports that she has never smoked. She has never used smokeless tobacco. She reports that she does not drink alcohol or use drugs.  Family History  Problem Relation Age of Onset  . Breast cancer Mother 67  . Heart disease Mother   . Throat cancer Father   . Hypertension Father   . Arthritis Father   . Diabetes Father   . Arthritis Sister   . Obesity Sister   . Diabetes Sister   . Heart disease Cousin   . Colon cancer Neg Hx   . Stomach cancer Neg Hx   . Esophageal cancer Neg Hx     Medications: Patient's Medications  New Prescriptions   No medications on file  Previous Medications   ALBUTEROL (PROVENTIL HFA;VENTOLIN HFA) 108 (90 BASE) MCG/ACT INHALER    Inhale 1-2 puffs into the lungs every 6 (six) hours as needed for wheezing or shortness of breath.   ANASTROZOLE (ARIMIDEX) 1 MG TABLET    Take 1 tablet (1 mg total) by mouth daily.   BENZONATATE (TESSALON PERLES) 100 MG CAPSULE    Take 2 capsules (200 mg total) by mouth 3 (three) times daily as needed for cough.   BUDESONIDE-FORMOTEROL (SYMBICORT) 160-4.5 MCG/ACT INHALER    Inhale 2 puffs into the lungs 2 (two) times daily. For bronchitis   CETIRIZINE (ZYRTEC) 10 MG TABLET    Take 10 mg by mouth at bedtime.   DOCUSATE SODIUM (COLACE) 100 MG CAPSULE    Take 100 mg by mouth daily as needed for mild constipation. TAKE PER BOTTLE AS NEEDED FOR  CONSTIPATION    DULOXETINE (CYMBALTA) 60 MG CAPSULE    TAKE ONE CAPSULE BY MOUTH ONCE DAILY FOR ANXIETY   INSULIN ASPART (NOVOLOG FLEXPEN) 100 UNIT/ML FLEXPEN    Inject 30 Units into the skin 2 (two) times daily.   INSULIN GLARGINE (TOUJEO SOLOSTAR) 300 UNIT/ML SOPN    Inject 70 Units into the skin 2 (two) times daily. (before breakfast and before supper)   ISOSORBIDE MONONITRATE (IMDUR) 60 MG 24 HR TABLET    TAKE ONE TABLET BY MOUTH ONCE DAILY   LANCETS (ONETOUCH ULTRASOFT) LANCETS    Check blood sugar 2-3 times daily as directed   LOSARTAN (COZAAR) 25 MG TABLET  Take 1 tablet (25 mg total) by mouth daily.   MECLIZINE (ANTIVERT) 25 MG TABLET    Take 1 tablet (25 mg total) by mouth 3 (three) times daily as needed for dizziness.   METOPROLOL SUCCINATE (TOPROL-XL) 25 MG 24 HR TABLET    TAKE ONE TABLET BY MOUTH ONCE DAILY FOR BLOOD PRESSURE   NITROGLYCERIN (NITROSTAT) 0.4 MG SL TABLET    Take one tablet under the tongue every 5 minutes as needed for chest pain   ONETOUCH VERIO TEST STRIP    Use to test blood sugar three times daily. Dx: E11.00   OXYCODONE-ACETAMINOPHEN (PERCOCET) 10-325 MG TABLET    Take 1 tablet by mouth every 6 (six) hours as needed for pain.   PANTOPRAZOLE (PROTONIX) 40 MG TABLET    TAKE ONE TABLET BY MOUTH ONCE DAILY   PRAVASTATIN (PRAVACHOL) 20 MG TABLET    TAKE 1 TABLET BY MOUTH ONCE DAILY FOR CHOLESTEROL   SODIUM CHLORIDE (OCEAN) 0.65 % SOLN NASAL SPRAY    Place 1 spray into both nostrils 3 (three) times daily as needed (for sinus congestion.). RINSE AS NEEDED FOR SINUS CONGESTION    SPACER/AERO-HOLDING CHAMBERS (AEROCHAMBER PLUS FLO-VU LARGE) MISC    1 each by Other route once.   ULORIC 40 MG TABLET    TAKE ONE TABLET BY MOUTH ONCE DAILY   UNABLE TO FIND    PT/INR check and advise, Recommended Range 2-3, DX : I27.82, Fax results to Dr.Carter [phone]   WARFARIN (COUMADIN) 5 MG TABLET    Take 1 tablet daily except 1 & 1/2 tablets on Tuesdays and Thursdays  Modified  Medications   No medications on file  Discontinued Medications   No medications on file     Physical Exam:  Vitals:   11/22/16 1319  BP: 132/72  Pulse: 81  Resp: 17  SpO2: 96%   There is no height or weight on file to calculate BMI.  Physical Exam  Constitutional: She appears well-developed and well-nourished.  Sitting in w/c in NAD, morbidly obese  Eyes: Pupils are equal, round, and reactive to light. No scleral icterus.  Neck: Neck supple. Carotid bruit is not present.  Cardiovascular: Normal rate and regular rhythm.  Exam reveals no gallop and no friction rub.   Murmur (2/6 SEM) heard. Pulmonary/Chest: Effort normal and breath sounds normal. No respiratory distress. She has no wheezes. She has no rales.  Abdominal: Normal appearance. There is no hepatomegaly. There is no rigidity.  obese  Lymphadenopathy:    She has no cervical adenopathy.  Neurological: She is alert.  Skin: Skin is warm and dry. No rash noted.  Psychiatric: She has a normal mood and affect. Her behavior is normal.    Labs reviewed: Basic Metabolic Panel:  Recent Labs  07/27/16 1027 08/02/16 1551 09/20/16 0923 09/25/16 1133 10/13/16 1237  NA 136  --  135* 135 131*  K 5.5*  --  5.9 No visable hemolysis* 4.9 4.7  CL 102  --   --  99 97*  CO2 26  --  24 23 26   GLUCOSE 176*  --  398* 285* 408*  BUN 39*  --  24.4 19 20   CREATININE 1.40*  --  1.4* 1.31* 1.25*  CALCIUM 8.9  --  9.1 8.8 8.7  TSH  --  1.52  --   --   --    Liver Function Tests:  Recent Labs  04/24/16 1247 07/27/16 1027 09/20/16 0923  AST 12 14 11   ALT  8 9 12   ALKPHOS 73 84 92  BILITOT 0.3 0.3 0.44  PROT 7.4 7.5 7.5  ALBUMIN 3.3* 3.3* 2.8*   No results for input(s): LIPASE, AMYLASE in the last 8760 hours. No results for input(s): AMMONIA in the last 8760 hours. CBC:  Recent Labs  07/27/16 1027 08/11/16 1130 09/20/16 0923  WBC 14.6* 13.5* 10.7*  NEUTROABS 10,658* 8,910* 6.9*  HGB 9.9* 9.3* 8.9*  HCT 32.4* 29.7*  29.1*  MCV 84.6 84.4 87.1  PLT 361 356 282   Lipid Panel:  Recent Labs  06/23/16 1455 10/13/16 1230  CHOL 102 99  HDL 36* 34*  LDLCALC 47 41  TRIG 95 118  CHOLHDL 2.8 2.9   TSH:  Recent Labs  08/02/16 1551  TSH 1.52   A1C: Lab Results  Component Value Date   HGBA1C 12.1 (H) 10/13/2016     Assessment/Plan 1. Long term current use of anticoagulant therapy - POC INR 2.0 which is goal (2-3) Will cont current regimen of coumadin 5 mg daily except 7.5 mg on Tuesday and thursdays  2. Other chronic pulmonary embolism without acute cor pulmonale (HCC) No signs of recurrence. To cont coumadin for prevention of recurrence   To keep follow up with [facility] as scheduled.  Carlos American. Harle Battiest  [facility] [phone] 8 am - 5 pm) [phone] (after hours)

## 2016-11-23 ENCOUNTER — Ambulatory Visit: Payer: PPO | Admitting: Nurse Practitioner

## 2016-11-27 ENCOUNTER — Ambulatory Visit (INDEPENDENT_AMBULATORY_CARE_PROVIDER_SITE_OTHER): Payer: PPO | Admitting: Pharmacotherapy

## 2016-11-27 ENCOUNTER — Other Ambulatory Visit: Payer: Self-pay

## 2016-11-27 ENCOUNTER — Encounter: Payer: Self-pay | Admitting: Pharmacotherapy

## 2016-11-27 VITALS — BP 134/82 | HR 81 | Temp 98.2°F | Resp 17

## 2016-11-27 DIAGNOSIS — I2782 Chronic pulmonary embolism: Secondary | ICD-10-CM | POA: Diagnosis not present

## 2016-11-27 DIAGNOSIS — Z7901 Long term (current) use of anticoagulants: Secondary | ICD-10-CM | POA: Diagnosis not present

## 2016-11-27 LAB — POCT INR: INR: 2

## 2016-11-27 MED ORDER — ISOSORBIDE MONONITRATE ER 60 MG PO TB24: 60.0000 mg | ORAL_TABLET | Freq: Every day | ORAL | 1 refills | Status: DC

## 2016-11-27 MED ORDER — PRAVASTATIN SODIUM 20 MG PO TABS: ORAL_TABLET | ORAL | 1 refills | Status: DC

## 2016-11-27 NOTE — Patient Instructions (Signed)
INR 2.0  Continue Coumadin 5mg  daily except 7.5mg  on Tuesdays and Thursdays

## 2016-11-27 NOTE — Progress Notes (Signed)
   Subjective:    Patient ID: [name], female    DOB: 07/18/1934, 82 y.o.   MRN: [id number]  HPI Last INR on 11/22/16 was OK at 2.0 Current Coumadin dose is 5mg  QD except 7.5mg  T/Th Denies CP, SOB, falls Denies unusual bleeding or bruising. Consistent with vitamin K intake  Complains of burning and numbness in fingers.   Review of Systems  HENT: Negative for nosebleeds.   Respiratory: Negative for shortness of breath.   Cardiovascular: Negative for chest pain.  Gastrointestinal: Negative for anal bleeding and blood in stool.  Genitourinary: Negative for hematuria.  Neurological: Positive for numbness.  Hematological: Does not bruise/bleed easily.       Objective:   Physical Exam  Constitutional: She is oriented to person, place, and time. She appears well-developed and well-nourished.  HENT:  Right Ear: External ear normal.  Left Ear: External ear normal.  Cardiovascular: Normal rate, regular rhythm and normal heart sounds.   Pulmonary/Chest: Effort normal and breath sounds normal.  Neurological: She is alert and oriented to person, place, and time.  Skin: Skin is warm and dry.  Psychiatric: She has a normal mood and affect. Her behavior is normal. Judgment and thought content normal.  Vitals reviewed.   BP: 134/82  HR: 81  Wt: 347lb INR 2.0      Assessment & Plan:  1.  INR at goal 2-3 2.  Continue Coumadin 5mg  QD except 7.5mg  T/Th 3.  RTC 1 month 4.  Try capsacin cream to fingers.

## 2016-11-27 NOTE — Telephone Encounter (Signed)
Refill request from [facility] for imdur and pravastatin. Rx were sent to pharmacy electronically.

## 2016-11-29 NOTE — Telephone Encounter (Signed)
Received fax from [facility] [phone] stating patient is eligible to receive Toujeo. Patient will remain eligible for this therapy until 05/07/2017. Will be shipped to office.

## 2016-12-04 ENCOUNTER — Telehealth: Payer: Self-pay | Admitting: *Deleted

## 2016-12-04 NOTE — Telephone Encounter (Signed)
Patient is requesting refill on Tessalon 100mg  to be sent to [facility] [phone] Fax: [phone]. Is this ok to refill. Please Advise.

## 2016-12-04 NOTE — Telephone Encounter (Signed)
Ok to RF #30 tabs with no additional RF

## 2016-12-05 MED ORDER — BENZONATATE 100 MG PO CAPS: 200.0000 mg | ORAL_CAPSULE | Freq: Three times a day (TID) | ORAL | 0 refills | Status: DC | PRN

## 2016-12-05 NOTE — Telephone Encounter (Signed)
Rx faxed to [facility].

## 2016-12-06 ENCOUNTER — Ambulatory Visit (INDEPENDENT_AMBULATORY_CARE_PROVIDER_SITE_OTHER): Payer: PPO | Admitting: Internal Medicine

## 2016-12-06 ENCOUNTER — Telehealth: Payer: Self-pay

## 2016-12-06 ENCOUNTER — Encounter: Payer: Self-pay | Admitting: Internal Medicine

## 2016-12-06 VITALS — BP 144/84 | HR 67 | Temp 98.2°F | Ht 67.0 in | Wt 341.0 lb

## 2016-12-06 DIAGNOSIS — R300 Dysuria: Secondary | ICD-10-CM | POA: Diagnosis not present

## 2016-12-06 DIAGNOSIS — Z7901 Long term (current) use of anticoagulants: Secondary | ICD-10-CM | POA: Diagnosis not present

## 2016-12-06 DIAGNOSIS — L299 Pruritus, unspecified: Secondary | ICD-10-CM | POA: Diagnosis not present

## 2016-12-06 DIAGNOSIS — L853 Xerosis cutis: Secondary | ICD-10-CM | POA: Diagnosis not present

## 2016-12-06 DIAGNOSIS — T148XXA Other injury of unspecified body region, initial encounter: Secondary | ICD-10-CM | POA: Diagnosis not present

## 2016-12-06 MED ORDER — MECLIZINE HCL 25 MG PO TABS: 25.0000 mg | ORAL_TABLET | Freq: Three times a day (TID) | ORAL | 0 refills | Status: DC | PRN

## 2016-12-06 MED ORDER — HYDROXYZINE HCL 10 MG PO TABS: 10.0000 mg | ORAL_TABLET | Freq: Three times a day (TID) | ORAL | 1 refills | Status: DC | PRN

## 2016-12-06 NOTE — Telephone Encounter (Signed)
9  Boxes of Toujeo received from [facility] (patient assistance), samples placed in refrigerator in the sample closet for storage  Left message informing patient medication available for pick-up

## 2016-12-06 NOTE — Patient Instructions (Addendum)
Use moisturizing lotion - Eucerin, aquaphor, jergens, aveeno, cetephil, etc  Use sensitive skin soap  Use dye-free/parfume free laundry detergents  Take luke warm baths/showers  Will call with urology referral  Follow up as scheduled  Eczema Eczema, also called atopic dermatitis, is a skin disorder that causes inflammation of the skin. It causes a red rash and dry, scaly skin. The skin becomes very itchy. Eczema is generally worse during the cooler winter months and often improves with the warmth of summer. Eczema usually starts showing signs in infancy. Some children outgrow eczema, but it may last through adulthood. What are the causes? The exact cause of eczema is not known, but it appears to run in families. People with eczema often have a family history of eczema, allergies, asthma, or hay fever. Eczema is not contagious. Flare-ups of the condition may be caused by:  Contact with something you are sensitive or allergic to.  Stress.  What are the signs or symptoms?  Dry, scaly skin.  Red, itchy rash.  Itchiness. This may occur before the skin rash and may be very intense. How is this diagnosed? The diagnosis of eczema is usually made based on symptoms and medical history. How is this treated? Eczema cannot be cured, but symptoms usually can be controlled with treatment and other strategies. A treatment plan might include:  Controlling the itching and scratching. ? Use over-the-counter antihistamines as directed for itching. This is especially useful at night when the itching tends to be worse. ? Use over-the-counter steroid creams as directed for itching. ? Avoid scratching. Scratching makes the rash and itching worse. It may also result in a skin infection (impetigo) due to a break in the skin caused by scratching.  Keeping the skin well moisturized with creams every day. This will seal in moisture and help prevent dryness. Lotions that contain alcohol and water should be  avoided because they can dry the skin.  Limiting exposure to things that you are sensitive or allergic to (allergens).  Recognizing situations that cause stress.  Developing a plan to manage stress.  Follow these instructions at home:  Only take over-the-counter or prescription medicines as directed by your health care provider.  Do not use anything on the skin without checking with your health care provider.  Keep baths or showers short (5 minutes) in warm (not hot) water. Use mild cleansers for bathing. These should be unscented. You may add nonperfumed bath oil to the bath water. It is best to avoid soap and bubble bath.  Immediately after a bath or shower, when the skin is still damp, apply a moisturizing ointment to the entire body. This ointment should be a petroleum ointment. This will seal in moisture and help prevent dryness. The thicker the ointment, the better. These should be unscented.  Keep fingernails cut short. Children with eczema may need to wear soft gloves or mittens at night after applying an ointment.  Dress in clothes made of cotton or cotton blends. Dress lightly, because heat increases itching.  A child with eczema should stay away from anyone with fever blisters or cold sores. The virus that causes fever blisters (herpes simplex) can cause a serious skin infection in children with eczema. Contact a health care provider if:  Your itching interferes with sleep.  Your rash gets worse or is not better within 1 week after starting treatment.  You see pus or soft yellow scabs in the rash area.  You have a fever.  You have a rash  flare-up after contact with someone who has fever blisters. This information is not intended to replace advice given to you by your health care provider. Make sure you discuss any questions you have with your health care provider. Document Released: 04/21/2000 Document Revised: 09/30/2015 Document Reviewed: 11/25/2012 Elsevier  Interactive Patient Education  2017 [employer].

## 2016-12-06 NOTE — Progress Notes (Signed)
Patient ID: [name], female   DOB: [birth year], 82 y.o.   MRN: [id number]    Location:  [name] Place of Service: OFFICE  Chief Complaint  Patient presents with  . Acute Visit    itching all over, Benedryl tablets not working, since June  . Dysuria    and smells for one week    HPI:  82 yo female seen today for generalized pruritis. She is taking benadryl tabs. She uses Psychologist, prison and probation services detergent and [employer]. She reports "scratching everywhere". No rash. She uses olay lotion most times. Last night she used dove sensitive skin soap and jergens lotion and skin feels a little better. She takes zyrtec daily for seasonal allergy.   She has intermittent dysuria at end of urination and pain feels "like an ache". No hematuria. UA last month revealed leukocytes and cx NGTD. She has never seen a urologist. She is on arimidex for breast cancer  She is a poor historian due to memory loss; hx obtained from chart and grandson  Past Medical History:  Diagnosis Date  . Allergy    takes Mucinex daily as needed  . Anemia, unspecified   . Anxiety    takes Clonazepam daily as needed  . Arthritis   . CHF (congestive heart failure) (HCC)    takes Furosemide daily  . Coronary artery disease    a. s/p IMI 2004 tx with BMS to RCA;  b. s/p Promus DES to RCA 2/12 (cath: LM 20-30%, pLAD 20-30%, mLAD 50%, RI 30%, pRCA 60%, mRCA 90% - tx with PCI);  c. myoview 1/07: EF 49%, inf scar, no isch  . Coronary atherosclerosis of native coronary artery   . Depression    takes Cymbalta daily  . Diabetes mellitus    insulin daily  . Dysuria   . GERD (gastroesophageal reflux disease)    takes Protonix daily  . Gout, unspecified    takes Colchicine daily  . Headache    occasionally  . History of blood clots about 3yrs ago   in legs-takes Coumadin   . Hyperlipidemia    takes Pravastatin daily  . Hypertension    takes Imdur and Metoprolol daily  . Insomnia   . Joint pain   . Joint swelling   . Myocardial  infarction ([place]) 2004  . Numbness   . Obstructive sleep apnea   . Osteoarthritis   . Osteoarthritis   . Osteoarthrosis, unspecified whether generalized or localized, lower leg   . Pain, chronic   . Polymyalgia rheumatica ([place])   . Pulmonary emboli ([place]) 9/13   felt to need lifelong anticoagulation  . Shortness of breath dyspnea    with exertion and has Albuterol inhaler prn  . Type II or unspecified type diabetes mellitus without mention of complication, uncontrolled   . Urinary incontinence    takes Linzess daily  . Vertigo    hx of;was taking Meclizine if needed    Past Surgical History:  Procedure Laterality Date  . ABDOMINAL HYSTERECTOMY     partial  . APPENDECTOMY    . blood clots/legs and lungs  2013  . BREAST BIOPSY Left 07/22/2014  . BREAST BIOPSY Left 02/10/2013  . BREAST LUMPECTOMY Left 11/05/2014  . BREAST LUMPECTOMY WITH RADIOACTIVE SEED LOCALIZATION Left 11/05/2014   Procedure: LEFT BREAST LUMPECTOMY WITH RADIOACTIVE SEED LOCALIZATION;  Surgeon: [name], MD;  Location: [place];  Service: General;  Laterality: Left;  . CARDIAC CATHETERIZATION    . COLONOSCOPY    .  CORONARY ANGIOPLASTY  2  . ESOPHAGOGASTRODUODENOSCOPY (EGD) WITH PROPOFOL N/A 11/07/2016   Procedure: ESOPHAGOGASTRODUODENOSCOPY (EGD) WITH PROPOFOL;  Surgeon: Gatha Mayer, MD;  Location: [facility];  Service: Endoscopy;  Laterality: N/A;  . EXCISION OF SKIN TAG Right 11/05/2014   Procedure: EXCISION OF RIGHT EYELID SKIN TAG;  Surgeon: [name], MD;  Location: [place];  Service: General;  Laterality: Right;  . EYE SURGERY Bilateral    cataract   . GASTRIC BYPASS  1977    reversed in 1979, [place] N/A 06/29/2014   Procedure: LEFT HEART CATHETERIZATION WITH CORONARY ANGIOGRAM;  Surgeon: Troy Sine, MD;  Location: [facility];  Service: Cardiovascular;  Laterality: N/A;  . MI with stent placement  2004    Patient Care  Team: Gildardo Cranker, DO as PCP - General (Internal Medicine) [name], [name], MD (Inactive) as Consulting Physician (Cardiology) [name], MD as Consulting Physician (Ophthalmology) [name], MD as Consulting Physician (General Surgery) [name], MD as Referring Physician (Obstetrics and Gynecology)  Social History   Social History  . Marital status: Widowed    Spouse name: N/A  . Number of children: N/A  . Years of education: N/A   Occupational History  . retired    Social History Main Topics  . Smoking status: Never Smoker  . Smokeless tobacco: Never Used  . Alcohol use No  . Drug use: No  . Sexual activity: Not Currently   Other Topics Concern  . Not on file   Social History Narrative  . No narrative on file     reports that she has never smoked. She has never used smokeless tobacco. She reports that she does not drink alcohol or use drugs.  Family History  Problem Relation Age of Onset  . Breast cancer Mother 55  . Heart disease Mother   . Throat cancer Father   . Hypertension Father   . Arthritis Father   . Diabetes Father   . Arthritis Sister   . Obesity Sister   . Diabetes Sister   . Heart disease Cousin   . Colon cancer Neg Hx   . Stomach cancer Neg Hx   . Esophageal cancer Neg Hx    Family Status  Relation Status  . Mother Deceased at age 77       Cause of Death: Breast cancer  . Father Deceased at age 47       Cause of Death: Complications of diabetes & HTN  . Sister Deceased at age 45  . Sister Alive  . Daughter Alive  . Son Alive  . Sister Alive  . Sister Alive  . Brother Alive  . Daughter Alive  . MGM Deceased  . MGF Deceased  . PGM Deceased  . PGF Deceased  . Cousin Deceased  . Neg Hx (Not Specified)     Allergies  Allergen Reactions  . Sulfonamide Derivatives Swelling    Mouth swelling  . Tramadol Nausea And Vomiting    Medications: Patient's Medications  New Prescriptions   No medications on file   Previous Medications   ALBUTEROL (PROVENTIL HFA;VENTOLIN HFA) 108 (90 BASE) MCG/ACT INHALER    Inhale 1-2 puffs into the lungs every 6 (six) hours as needed for wheezing or shortness of breath.   ANASTROZOLE (ARIMIDEX) 1 MG TABLET    Take 1 tablet (1 mg total) by mouth daily.   BENZONATATE (TESSALON PERLES) 100 MG CAPSULE  Take 2 capsules (200 mg total) by mouth 3 (three) times daily as needed for cough.   BUDESONIDE-FORMOTEROL (SYMBICORT) 160-4.5 MCG/ACT INHALER    Inhale 2 puffs into the lungs 2 (two) times daily. For bronchitis   CETIRIZINE (ZYRTEC) 10 MG TABLET    Take 10 mg by mouth at bedtime.   DOCUSATE SODIUM (COLACE) 100 MG CAPSULE    Take 100 mg by mouth daily as needed for mild constipation. TAKE PER BOTTLE AS NEEDED FOR CONSTIPATION    DULOXETINE (CYMBALTA) 60 MG CAPSULE    TAKE ONE CAPSULE BY MOUTH ONCE DAILY FOR ANXIETY   INSULIN ASPART (NOVOLOG FLEXPEN) 100 UNIT/ML FLEXPEN    Inject 30 Units into the skin 2 (two) times daily.   INSULIN GLARGINE (TOUJEO SOLOSTAR) 300 UNIT/ML SOPN    Inject 70 Units into the skin 2 (two) times daily. (before breakfast and before supper)   ISOSORBIDE MONONITRATE (IMDUR) 60 MG 24 HR TABLET    Take 1 tablet (60 mg total) by mouth daily.   LANCETS (ONETOUCH ULTRASOFT) LANCETS    Check blood sugar 2-3 times daily as directed   LOSARTAN (COZAAR) 25 MG TABLET    Take 1 tablet (25 mg total) by mouth daily.   METOPROLOL SUCCINATE (TOPROL-XL) 25 MG 24 HR TABLET    TAKE ONE TABLET BY MOUTH ONCE DAILY FOR BLOOD PRESSURE   NITROGLYCERIN (NITROSTAT) 0.4 MG SL TABLET    Take one tablet under the tongue every 5 minutes as needed for chest pain   ONETOUCH VERIO TEST STRIP    Use to test blood sugar three times daily. Dx: E11.00   OXYCODONE-ACETAMINOPHEN (PERCOCET) 10-325 MG TABLET    Take 1 tablet by mouth every 6 (six) hours as needed for pain.   PANTOPRAZOLE (PROTONIX) 40 MG TABLET    TAKE ONE TABLET BY MOUTH ONCE DAILY   PRAVASTATIN (PRAVACHOL) 20 MG TABLET     TAKE 1 TABLET BY MOUTH ONCE DAILY FOR CHOLESTEROL   SODIUM CHLORIDE (OCEAN) 0.65 % SOLN NASAL SPRAY    Place 1 spray into both nostrils 3 (three) times daily as needed (for sinus congestion.). RINSE AS NEEDED FOR SINUS CONGESTION    SPACER/AERO-HOLDING CHAMBERS (AEROCHAMBER PLUS FLO-VU LARGE) MISC    1 each by Other route once.   ULORIC 40 MG TABLET    TAKE ONE TABLET BY MOUTH ONCE DAILY   UNABLE TO FIND    PT/INR check and advise, Recommended Range 2-3, DX : I27.82, Fax results to Dr.Deveion Denz [phone]   WARFARIN (COUMADIN) 5 MG TABLET    Take 1 tablet daily except 1 & 1/2 tablets on Tuesdays and Thursdays  Modified Medications   Modified Medication Previous Medication   MECLIZINE (ANTIVERT) 25 MG TABLET meclizine (ANTIVERT) 25 MG tablet      Take 1 tablet (25 mg total) by mouth 3 (three) times daily as needed for dizziness.    Take 1 tablet (25 mg total) by mouth 3 (three) times daily as needed for dizziness.  Discontinued Medications   No medications on file    Review of Systems  Unable to perform ROS: Other (memory loss)    Vitals:   12/06/16 0953  Weight: (!) 341 lb (154.7 kg)   Body mass index is 53.41 kg/m.  Physical Exam  Constitutional: She appears well-developed and well-nourished.  Abdominal: There is tenderness (left abdomen subcut hematoma).  Musculoskeletal: She exhibits edema and tenderness.  Neurological: She is alert.  Skin: Rash (patchy eczematous rash in folds of  skin with no secondary signs of infection. no burrows. no vesicular formation) noted.  dry  Psychiatric: She has a normal mood and affect. Her behavior is normal.     Labs reviewed: Office Visit on 11/27/2016  Component Date Value Ref Range Status  . INR 11/27/2016 2.0   Final  Office Visit on 11/22/2016  Component Date Value Ref Range Status  . INR 11/22/2016 2.0   Final  Office Visit on 11/15/2016  Component Date Value Ref Range Status  . INR 11/15/2016 1.4   Final  Abstract on 11/14/2016   Component Date Value Ref Range Status  . INR 11/14/2016 1.9* 0.9 - 1.1 Final  . Protime 11/14/2016 18.9* 10.0 - 13.8 Final  Admission on 11/07/2016, Discharged on 11/07/2016  Component Date Value Ref Range Status  . Glucose-Capillary 11/07/2016 92  65 - 99 mg/dL Final  Office Visit on 11/06/2016  Component Date Value Ref Range Status  . INR 11/06/2016 2.8   Final  Orders Only on 10/19/2016  Component Date Value Ref Range Status  . Organism ID, Bacteria 10/19/2016    Final                   Value:Three or more organisms present,each greater than 10,000 CFU/mL.These organisms,commonly found on external and internal genitalia,are considered to be colonizers.No further testing performed.   Lab on 10/19/2016  Component Date Value Ref Range Status  . Color, Urine 10/19/2016 YELLOW  YELLOW Final  . APPearance 10/19/2016 CLEAR  CLEAR Final  . Specific Gravity, Urine 10/19/2016 1.008  1.001 - 1.035 Final  . pH 10/19/2016 6.0  5.0 - 8.0 Final  . Glucose, UA 10/19/2016 NEGATIVE  NEGATIVE Final  . Bilirubin Urine 10/19/2016 NEGATIVE  NEGATIVE Final  . Ketones, ur 10/19/2016 NEGATIVE  NEGATIVE Final  . Hgb urine dipstick 10/19/2016 TRACE* NEGATIVE Final  . Protein, ur 10/19/2016 NEGATIVE  NEGATIVE Final  . Nitrite 10/19/2016 NEGATIVE  NEGATIVE Final  . Leukocytes, UA 10/19/2016 2+* NEGATIVE Final  . WBC, UA 10/19/2016 40-60* <=5 WBC/HPF Final  . RBC / HPF 10/19/2016 NONE SEEN  <=2 RBC/HPF Final  . Squamous Epithelial / LPF 10/19/2016 NONE SEEN  <=5 HPF Final  . Bacteria, UA 10/19/2016 NONE SEEN  NONE SEEN HPF Final  . Crystals 10/19/2016 NONE SEEN  NONE SEEN HPF Final  . Casts 10/19/2016 NONE SEEN  NONE SEEN LPF Final  . Yeast 10/19/2016 NONE SEEN  NONE SEEN HPF Final  Office Visit on 10/13/2016  Component Date Value Ref Range Status  . Cholesterol 10/13/2016 99  <200 mg/dL Final  . Triglycerides 10/13/2016 118  <150 mg/dL Final  . HDL 10/13/2016 34* >50 mg/dL Final  . Total CHOL/HDL  Ratio 10/13/2016 2.9  <5.0 Ratio Final  . VLDL 10/13/2016 24  <30 mg/dL Final  . LDL Cholesterol 10/13/2016 41  <100 mg/dL Final  . Hgb A1c MFr Bld 10/13/2016 12.1* <5.7 % Final   Comment:   For someone without known diabetes, a hemoglobin A1c value of 6.5% or greater indicates that they may have diabetes and this should be confirmed with a follow-up test.   For someone with known diabetes, a value <7% indicates that their diabetes is well controlled and a value greater than or equal to 7% indicates suboptimal control. A1c targets should be individualized based on duration of diabetes, age, comorbid conditions, and other considerations.   Currently, no consensus exists for use of hemoglobin A1c for diagnosis of diabetes for children.     [name]  Mean Plasma Glucose 10/13/2016 301  mg/dL Final  . Sodium 10/13/2016 131* 135 - 146 mmol/L Final  . Potassium 10/13/2016 4.7  3.5 - 5.3 mmol/L Final  . Chloride 10/13/2016 97* 98 - 110 mmol/L Final  . CO2 10/13/2016 26  20 - 31 mmol/L Final  . Glucose, Bld 10/13/2016 408* 65 - 99 mg/dL Final   Result repeated and verified.  . BUN 10/13/2016 20  7 - 25 mg/dL Final  . Creat 10/13/2016 1.25* 0.60 - 0.93 mg/dL Final   Comment:   For patients > or = 80 years of age: The upper reference limit for Creatinine is approximately 13% higher for people identified as African-American.     . Calcium 10/13/2016 8.7  8.6 - 10.4 mg/dL Final  Office Visit on 10/09/2016  Component Date Value Ref Range Status  . INR 10/09/2016 3.1   Final  There may be more visits with results that are not included.    No results found.   Assessment/Plan   ICD-10-CM   1. Dry skin dermatitis L85.3   2. Dysuria R30.0 Ambulatory referral to Urology  3. Hematoma T14.8XXA    left subcut abdomen  4. Long term current use of anticoagulant therapy Z79.01   5. Itching L29.9 hydrOXYzine (ATARAX/VISTARIL) 10 MG tablet   Reassurance given for subcut hematoma at injection site  of lovenox. It should cont to shrink in size.  Use moisturizing lotion - Eucerin, aquaphor, jergens, aveeno, cetephil, etc  Use sensitive skin soap  Use dye-free/parfume free laundry detergents  Take luke warm baths/showers  Education handout given for eczema  Will call with urology referral  Follow up as scheduled   Nevin Grizzle S. Perlie Gold  [facility] [address] [place], [place] [address] [phone] Cell (Monday-Friday 8 AM - 5 PM) [phone] After 5 PM and follow prompts

## 2016-12-12 ENCOUNTER — Ambulatory Visit (INDEPENDENT_AMBULATORY_CARE_PROVIDER_SITE_OTHER): Payer: PPO | Admitting: Internal Medicine

## 2016-12-12 ENCOUNTER — Encounter: Payer: Self-pay | Admitting: Internal Medicine

## 2016-12-12 VITALS — BP 132/78 | HR 69 | Temp 97.8°F | Ht 67.0 in | Wt 341.0 lb

## 2016-12-12 DIAGNOSIS — F332 Major depressive disorder, recurrent severe without psychotic features: Secondary | ICD-10-CM | POA: Diagnosis not present

## 2016-12-12 DIAGNOSIS — L299 Pruritus, unspecified: Secondary | ICD-10-CM | POA: Diagnosis not present

## 2016-12-12 DIAGNOSIS — I2782 Chronic pulmonary embolism: Secondary | ICD-10-CM | POA: Diagnosis not present

## 2016-12-12 DIAGNOSIS — J410 Simple chronic bronchitis: Secondary | ICD-10-CM

## 2016-12-12 DIAGNOSIS — G894 Chronic pain syndrome: Secondary | ICD-10-CM

## 2016-12-12 DIAGNOSIS — E1165 Type 2 diabetes mellitus with hyperglycemia: Secondary | ICD-10-CM | POA: Diagnosis not present

## 2016-12-12 DIAGNOSIS — R413 Other amnesia: Secondary | ICD-10-CM

## 2016-12-12 DIAGNOSIS — Z794 Long term (current) use of insulin: Secondary | ICD-10-CM

## 2016-12-12 DIAGNOSIS — E1121 Type 2 diabetes mellitus with diabetic nephropathy: Secondary | ICD-10-CM | POA: Diagnosis not present

## 2016-12-12 DIAGNOSIS — IMO0002 Reserved for concepts with insufficient information to code with codable children: Secondary | ICD-10-CM

## 2016-12-12 MED ORDER — ZOSTER VAC RECOMB ADJUVANTED 50 MCG/0.5ML IM SUSR: 0.5000 mL | Freq: Once | INTRAMUSCULAR | 1 refills | Status: DC

## 2016-12-12 MED ORDER — DONEPEZIL HCL 5 MG PO TABS: 5.0000 mg | ORAL_TABLET | Freq: Every day | ORAL | 3 refills | Status: DC

## 2016-12-12 MED ORDER — OXYCODONE-ACETAMINOPHEN 10-325 MG PO TABS: 1.0000 | ORAL_TABLET | Freq: Four times a day (QID) | ORAL | 0 refills | Status: DC | PRN

## 2016-12-12 MED ORDER — ZOSTER VAC RECOMB ADJUVANTED 50 MCG/0.5ML IM SUSR: 0.5000 mL | Freq: Once | INTRAMUSCULAR | 1 refills | Status: AC

## 2016-12-12 NOTE — Progress Notes (Signed)
Patient ID: [name], female   DOB: 09/25/1934, 82 y.o.   MRN: [id number]    Location:  [name] Place of Service: OFFICE  Chief Complaint  Patient presents with  . Medical Management of Chronic Issues    1 month follow-up on DM, GERD, Bronchitis and DVT. DM Foot exam due. Patient with ongoing itching   . Immunizations    RX for shingles printed, discuss pneumonia vaccine   . Medication Refill    Renew Oxycodone     HPI:  82 yo female seen today for f/u. She has lost 5 lbs since last OV. Her eating habits are vary.    f/u bronchitis and DM. She is using symbicort (red HFA) and a albuterol HFA. She has not needed grey HFA (ventolin) since using symbicort BID. She c/o itching all over but has not seen a rash. Itching improves with benadryl prn. She also is c/a multiple moles that are itchy.  She underwent EGD 11/07/16 and revealed tortuous esophagus s/p esophageal dilation. She had benign appearing stenosis of gastric body (from previous gastric bypass take down) that was traversed. Some pill granules was noted behind the stenosed area.   Hx DVT - stable on coumadin. INR 2 last month. GOAL INR 2-3. She developed hematoma at lovenox injection site which has not healed yet. She has not missed any doses.  DM - BS has been fluctuating. Daughter has missplaced Rx assistance forms for [employer]. She gets Novolog 30 units BID; Toujeo 70 units BID. She has polyuria, polydipsia, polyphagia. A1c 12.1%.  low BS reaction x 2 when BS 57 between 0300-0600 hrs.  Chronic bronchitis - improved SOB on symbicort. She has severe cough  Dry skin dermatitis - itching improving. She plans to change to dye free/parfume free detergent.   Pt is a poor historian due to dementia. Hx obtained from chart and daughter  Dementia - MMSE 26/30. She has increased executive function decline. She is ready to try medication for cognition   Past Medical History:  Diagnosis Date  . Allergy    takes Mucinex daily as needed   . Anemia, unspecified   . Anxiety    takes Clonazepam daily as needed  . Arthritis   . CHF (congestive heart failure) (HCC)    takes Furosemide daily  . Coronary artery disease    a. s/p IMI 2004 tx with BMS to RCA;  b. s/p Promus DES to RCA 2/12 (cath: LM 20-30%, pLAD 20-30%, mLAD 50%, RI 30%, pRCA 60%, mRCA 90% - tx with PCI);  c. myoview 1/07: EF 49%, inf scar, no isch  . Coronary atherosclerosis of native coronary artery   . Depression    takes Cymbalta daily  . Diabetes mellitus    insulin daily  . Dysuria   . GERD (gastroesophageal reflux disease)    takes Protonix daily  . Gout, unspecified    takes Colchicine daily  . Headache    occasionally  . History of blood clots about 31yrs ago   in legs-takes Coumadin   . Hyperlipidemia    takes Pravastatin daily  . Hypertension    takes Imdur and Metoprolol daily  . Insomnia   . Joint pain   . Joint swelling   . Myocardial infarction ([place]) 2004  . Numbness   . Obstructive sleep apnea   . Osteoarthritis   . Osteoarthritis   . Osteoarthrosis, unspecified whether generalized or localized, lower leg   . Pain, chronic   . Polymyalgia rheumatica ([place])   .  Pulmonary emboli ([place]) 9/13   felt to need lifelong anticoagulation  . Shortness of breath dyspnea    with exertion and has Albuterol inhaler prn  . Type II or unspecified type diabetes mellitus without mention of complication, uncontrolled   . Urinary incontinence    takes Linzess daily  . Vertigo    hx of;was taking Meclizine if needed    Past Surgical History:  Procedure Laterality Date  . ABDOMINAL HYSTERECTOMY     partial  . APPENDECTOMY    . blood clots/legs and lungs  2013  . BREAST BIOPSY Left 07/22/2014  . BREAST BIOPSY Left 02/10/2013  . BREAST LUMPECTOMY Left 11/05/2014  . BREAST LUMPECTOMY WITH RADIOACTIVE SEED LOCALIZATION Left 11/05/2014   Procedure: LEFT BREAST LUMPECTOMY WITH RADIOACTIVE SEED LOCALIZATION;  Surgeon: [name], MD;  Location:  [facility];  Service: General;  Laterality: Left;  . CARDIAC CATHETERIZATION    . COLONOSCOPY    . CORONARY ANGIOPLASTY  2  . ESOPHAGOGASTRODUODENOSCOPY (EGD) WITH PROPOFOL N/A 11/07/2016   Procedure: ESOPHAGOGASTRODUODENOSCOPY (EGD) WITH PROPOFOL;  Surgeon: Gatha Mayer, MD;  Location: [facility];  Service: Endoscopy;  Laterality: N/A;  . EXCISION OF SKIN TAG Right 11/05/2014   Procedure: EXCISION OF RIGHT EYELID SKIN TAG;  Surgeon: [name], MD;  Location: [place];  Service: General;  Laterality: Right;  . EYE SURGERY Bilateral    cataract   . GASTRIC BYPASS  1977    reversed in 1979, [place] N/A 06/29/2014   Procedure: LEFT HEART CATHETERIZATION WITH CORONARY ANGIOGRAM;  Surgeon: Troy Sine, MD;  Location: [facility] CATH LAB;  Service: Cardiovascular;  Laterality: N/A;  . MI with stent placement  2004    Patient Care Team: Gildardo Cranker, DO as PCP - General (Internal Medicine) [name], [name], MD (Inactive) as Consulting Physician (Cardiology) [name], MD as Consulting Physician (Ophthalmology) [name], MD as Consulting Physician (General Surgery) [name], MD as Referring Physician (Obstetrics and Gynecology)  Social History   Social History  . Marital status: Widowed    Spouse name: N/A  . Number of children: N/A  . Years of education: N/A   Occupational History  . retired    Social History Main Topics  . Smoking status: Never Smoker  . Smokeless tobacco: Never Used  . Alcohol use No  . Drug use: No  . Sexual activity: Not Currently   Other Topics Concern  . Not on file   Social History Narrative  . No narrative on file     reports that she has never smoked. She has never used smokeless tobacco. She reports that she does not drink alcohol or use drugs.  Family History  Problem Relation Age of Onset  . Breast cancer Mother 28  . Heart disease Mother   . Throat cancer Father   .  Hypertension Father   . Arthritis Father   . Diabetes Father   . Arthritis Sister   . Obesity Sister   . Diabetes Sister   . Heart disease Cousin   . Colon cancer Neg Hx   . Stomach cancer Neg Hx   . Esophageal cancer Neg Hx    Family Status  Relation Status  . Mother Deceased at age 24       Cause of Death: Breast cancer  . Father Deceased at age 39       Cause of Death: Complications of diabetes & HTN  . Sister  Deceased at age 48  . Sister Alive  . Daughter Alive  . Son Alive  . Sister Alive  . Sister Alive  . Brother Alive  . Daughter Alive  . MGM Deceased  . MGF Deceased  . PGM Deceased  . PGF Deceased  . Cousin Deceased  . Neg Hx (Not Specified)     Allergies  Allergen Reactions  . Sulfonamide Derivatives Swelling    Mouth swelling  . Tramadol Nausea And Vomiting    Medications: Patient's Medications  New Prescriptions   DONEPEZIL (ARICEPT) 5 MG TABLET    Take 1 tablet (5 mg total) by mouth at bedtime.  Previous Medications   ALBUTEROL (PROVENTIL HFA;VENTOLIN HFA) 108 (90 BASE) MCG/ACT INHALER    Inhale 1-2 puffs into the lungs every 6 (six) hours as needed for wheezing or shortness of breath.   ANASTROZOLE (ARIMIDEX) 1 MG TABLET    Take 1 tablet (1 mg total) by mouth daily.   BUDESONIDE-FORMOTEROL (SYMBICORT) 160-4.5 MCG/ACT INHALER    Inhale 2 puffs into the lungs 2 (two) times daily. For bronchitis   CETIRIZINE (ZYRTEC) 10 MG TABLET    Take 10 mg by mouth at bedtime.   DOCUSATE SODIUM (COLACE) 100 MG CAPSULE    Take 100 mg by mouth daily as needed for mild constipation. TAKE PER BOTTLE AS NEEDED FOR CONSTIPATION    DULOXETINE (CYMBALTA) 60 MG CAPSULE    TAKE ONE CAPSULE BY MOUTH ONCE DAILY FOR ANXIETY   HYDROXYZINE (ATARAX/VISTARIL) 10 MG TABLET    Take 1 tablet (10 mg total) by mouth 3 (three) times daily as needed for itching.   INSULIN ASPART (NOVOLOG FLEXPEN) 100 UNIT/ML FLEXPEN    Inject 30 Units into the skin 2 (two) times daily.   INSULIN GLARGINE  (TOUJEO SOLOSTAR) 300 UNIT/ML SOPN    Inject 70 Units into the skin 2 (two) times daily. (before breakfast and before supper)   ISOSORBIDE MONONITRATE (IMDUR) 60 MG 24 HR TABLET    Take 1 tablet (60 mg total) by mouth daily.   LANCETS (ONETOUCH ULTRASOFT) LANCETS    Check blood sugar 2-3 times daily as directed   LOSARTAN (COZAAR) 25 MG TABLET    Take 1 tablet (25 mg total) by mouth daily.   MECLIZINE (ANTIVERT) 25 MG TABLET    Take 1 tablet (25 mg total) by mouth 3 (three) times daily as needed for dizziness.   METOPROLOL SUCCINATE (TOPROL-XL) 25 MG 24 HR TABLET    TAKE ONE TABLET BY MOUTH ONCE DAILY FOR BLOOD PRESSURE   NITROGLYCERIN (NITROSTAT) 0.4 MG SL TABLET    Take one tablet under the tongue every 5 minutes as needed for chest pain   ONETOUCH VERIO TEST STRIP    Use to test blood sugar three times daily. Dx: E11.00   OXYCODONE-ACETAMINOPHEN (PERCOCET) 10-325 MG TABLET    Take 1 tablet by mouth every 6 (six) hours as needed for pain.   PANTOPRAZOLE (PROTONIX) 40 MG TABLET    TAKE ONE TABLET BY MOUTH ONCE DAILY   PRAVASTATIN (PRAVACHOL) 20 MG TABLET    TAKE 1 TABLET BY MOUTH ONCE DAILY FOR CHOLESTEROL   SODIUM CHLORIDE (OCEAN) 0.65 % SOLN NASAL SPRAY    Place 1 spray into both nostrils 3 (three) times daily as needed (for sinus congestion.). RINSE AS NEEDED FOR SINUS CONGESTION    SPACER/AERO-HOLDING CHAMBERS (AEROCHAMBER PLUS FLO-VU LARGE) MISC    1 each by Other route once.   ULORIC 40 MG TABLET  TAKE ONE TABLET BY MOUTH ONCE DAILY   WARFARIN (COUMADIN) 5 MG TABLET    Take 1 tablet daily except 1 & 1/2 tablets on Tuesdays and Thursdays  Modified Medications   Modified Medication Previous Medication   ZOSTER VAC RECOMB ADJUVANTED (SHINGRIX) INJECTION Zoster Vac Recomb Adjuvanted (SHINGRIX) injection      Inject 0.5 mLs into the muscle once.    Inject 0.5 mLs into the muscle once.  Discontinued Medications   BENZONATATE (TESSALON PERLES) 100 MG CAPSULE    Take 2 capsules (200 mg total) by  mouth 3 (three) times daily as needed for cough.   UNABLE TO FIND    PT/INR check and advise, Recommended Range 2-3, DX : I27.82, Fax results to Dr.Aristotle Lieb [phone]    Review of Systems  Vitals:   12/12/16 1141  BP: 132/78  Pulse: 69  Temp: 97.8 F (36.6 C)  TempSrc: Oral  SpO2: 99%  Weight: (!) 341 lb (154.7 kg)  Height: 5\' 7"  (1.702 m)   Body mass index is 53.41 kg/m.  Physical Exam  Constitutional: She appears well-developed and well-nourished.  HENT:  Mouth/Throat: Oropharynx is clear and moist. No oropharyngeal exudate.  MMM; no oral thrush  Eyes: Pupils are equal, round, and reactive to light. No scleral icterus.  Neck: Neck supple. Carotid bruit is not present. No tracheal deviation present.  Cardiovascular: Normal rate, regular rhythm and intact distal pulses.  Exam reveals no gallop and no friction rub.   Murmur (1/6 SEM) heard. No LE edema b/l. no calf TTP.   Pulmonary/Chest: Effort normal and breath sounds normal. No stridor. No respiratory distress. She has no wheezes. She has no rales.  Reduced BS at base b/l  Abdominal: Soft. Normal appearance and bowel sounds are normal. She exhibits no distension and no mass. There is no hepatomegaly. There is no tenderness. There is no rigidity, no rebound and no guarding. No hernia.  Musculoskeletal: She exhibits edema and tenderness.  Lymphadenopathy:    She has no cervical adenopathy.  Neurological: She is alert.  Skin: Skin is warm and dry. Rash (patchy appearing in folds of skin; no secondary signs of infection) noted.  Psychiatric: She has a normal mood and affect. Her behavior is normal. Thought content normal.      Diabetic Foot Exam - Simple   Simple Foot Form Diabetic Foot exam was performed with the following findings:  Yes 12/12/2016 12:41 PM  Visual Inspection See comments:  Yes Sensation Testing Intact to touch and monofilament testing bilaterally:  Yes Pulse Check Posterior Tibialis and Dorsalis pulse  intact bilaterally:  Yes Comments Hammertoes b/l. No calluses or ulcerations     Labs reviewed: Office Visit on 11/27/2016  Component Date Value Ref Range Status  . INR 11/27/2016 2.0   Final  Office Visit on 11/22/2016  Component Date Value Ref Range Status  . INR 11/22/2016 2.0   Final  Office Visit on 11/15/2016  Component Date Value Ref Range Status  . INR 11/15/2016 1.4   Final  Abstract on 11/14/2016  Component Date Value Ref Range Status  . INR 11/14/2016 1.9* 0.9 - 1.1 Final  . Protime 11/14/2016 18.9* 10.0 - 13.8 Final  Admission on 11/07/2016, Discharged on 11/07/2016  Component Date Value Ref Range Status  . Glucose-Capillary 11/07/2016 92  65 - 99 mg/dL Final  Office Visit on 11/06/2016  Component Date Value Ref Range Status  . INR 11/06/2016 2.8   Final  Orders Only on 10/19/2016  Component Date  Value Ref Range Status  . Organism ID, Bacteria 10/19/2016    Final                   Value:Three or more organisms present,each greater than 10,000 CFU/mL.These organisms,commonly found on external and internal genitalia,are considered to be colonizers.No further testing performed.   Lab on 10/19/2016  Component Date Value Ref Range Status  . Color, Urine 10/19/2016 YELLOW  YELLOW Final  . APPearance 10/19/2016 CLEAR  CLEAR Final  . Specific Gravity, Urine 10/19/2016 1.008  1.001 - 1.035 Final  . pH 10/19/2016 6.0  5.0 - 8.0 Final  . Glucose, UA 10/19/2016 NEGATIVE  NEGATIVE Final  . Bilirubin Urine 10/19/2016 NEGATIVE  NEGATIVE Final  . Ketones, ur 10/19/2016 NEGATIVE  NEGATIVE Final  . Hgb urine dipstick 10/19/2016 TRACE* NEGATIVE Final  . Protein, ur 10/19/2016 NEGATIVE  NEGATIVE Final  . Nitrite 10/19/2016 NEGATIVE  NEGATIVE Final  . Leukocytes, UA 10/19/2016 2+* NEGATIVE Final  . WBC, UA 10/19/2016 40-60* <=5 WBC/HPF Final  . RBC / HPF 10/19/2016 NONE SEEN  <=2 RBC/HPF Final  . Squamous Epithelial / LPF 10/19/2016 NONE SEEN  <=5 HPF Final  . Bacteria, UA  10/19/2016 NONE SEEN  NONE SEEN HPF Final  . Crystals 10/19/2016 NONE SEEN  NONE SEEN HPF Final  . Casts 10/19/2016 NONE SEEN  NONE SEEN LPF Final  . Yeast 10/19/2016 NONE SEEN  NONE SEEN HPF Final  Office Visit on 10/13/2016  Component Date Value Ref Range Status  . Cholesterol 10/13/2016 99  <200 mg/dL Final  . Triglycerides 10/13/2016 118  <150 mg/dL Final  . HDL 10/13/2016 34* >50 mg/dL Final  . Total CHOL/HDL Ratio 10/13/2016 2.9  <5.0 Ratio Final  . VLDL 10/13/2016 24  <30 mg/dL Final  . LDL Cholesterol 10/13/2016 41  <100 mg/dL Final  . Hgb A1c MFr Bld 10/13/2016 12.1* <5.7 % Final   Comment:   For someone without known diabetes, a hemoglobin A1c value of 6.5% or greater indicates that they may have diabetes and this should be confirmed with a follow-up test.   For someone with known diabetes, a value <7% indicates that their diabetes is well controlled and a value greater than or equal to 7% indicates suboptimal control. A1c targets should be individualized based on duration of diabetes, age, comorbid conditions, and other considerations.   Currently, no consensus exists for use of hemoglobin A1c for diagnosis of diabetes for children.     . Mean Plasma Glucose 10/13/2016 301  mg/dL Final  . Sodium 10/13/2016 131* 135 - 146 mmol/L Final  . Potassium 10/13/2016 4.7  3.5 - 5.3 mmol/L Final  . Chloride 10/13/2016 97* 98 - 110 mmol/L Final  . CO2 10/13/2016 26  20 - 31 mmol/L Final  . Glucose, Bld 10/13/2016 408* 65 - 99 mg/dL Final   Result repeated and verified.  . BUN 10/13/2016 20  7 - 25 mg/dL Final  . Creat 10/13/2016 1.25* 0.60 - 0.93 mg/dL Final   Comment:   For patients > or = 80 years of age: The upper reference limit for Creatinine is approximately 13% higher for people identified as African-American.     . Calcium 10/13/2016 8.7  8.6 - 10.4 mg/dL Final  Office Visit on 10/09/2016  Component Date Value Ref Range Status  . INR 10/09/2016 3.1   Final  There  may be more visits with results that are not included.    No results found.   Assessment/Plan  ICD-10-CM   1. Memory loss - likely dementia R41.3 donepezil (ARICEPT) 5 MG tablet  2. Severe episode of recurrent major depressive disorder, without psychotic features ([place]) F33.2   3. Itching L29.9   4. Other chronic pulmonary embolism without acute cor pulmonale (HCC) I27.82   5. Uncontrolled type 2 diabetes mellitus with diabetic nephropathy, with long-term current use of insulin (HCC) E11.21    E11.65    Z79.4   6. Simple chronic bronchitis (HCC) J41.0    TAKE 1 DONEPEZIL TABLET AT BEDTIME FOR MEMORY LOSS. Handout given on dementia  Continue other medications as ordered. New percocet Rx given today  Follow up in 1 month for dementia, DM. Will check A1c, cmp, lipid panel at that [facility] S. Perlie Gold  [facility] [address] [place], [place] [address] [phone] Cell (Monday-Friday 8 AM - 5 PM) [phone] After 5 PM and follow prompts

## 2016-12-12 NOTE — Patient Instructions (Addendum)
TAKE 1 DONEPEZIL TABLET AT BEDTIME FOR MEMORY LOSS  Continue other medications as ordered  Follow up in 1 month for dementia, DM   Dementia Dementia means losing some of your brain ability. People with dementia may have problems with:  Memory.  Making decisions.  Behavior.  Speaking.  Thinking.  Solving problems.  Follow these instructions at home: Medicine  Take over-the-counter and prescription medicines only as told by your doctor.  Avoid taking medicines that can change how you think. These include pain or sleeping medicines. Lifestyle   Make healthy choices: ? Be active as told by your doctor. ? Do not use any tobacco products, such as cigarettes, chewing tobacco, and e-cigarettes. If you need help quitting, ask your doctor. ? Eat a healthy diet. ? When you get stressed, do something to help yourself relax. Your doctor can give you tips. ? Spend time with other people.  Drink enough fluid to keep your pee (urine) clear or pale yellow.  Make sure you get good sleep. Use these tips to help you get a good night's rest: ? Try not to take naps during the day. ? Keep your sleeping area dark and cool. ? In the few hours before you go to bed, try not to do any exercise. ? Try not to have foods and drinks with caffeine in the evening. General instructions  Talk with your doctor to figure out: ? What you need help with. ? What your safety needs are.  If you were given a bracelet that tracks your location, make sure to wear it.  Keep all follow-up visits as told by your doctor. This is important. Contact a doctor if:  You have any new problems.  You have problems with choking or swallowing.  You have any symptoms of a different sickness. Get help right away if:  You have a fever.  You feel mixed up (confused) or more mixed up than before.  You have new sleepiness.  You have sleepiness that gets worse.  You have a hard time staying awake.  You or your  family members are worried for your safety. This information is not intended to replace advice given to you by your health care provider. Make sure you discuss any questions you have with your health care provider. Document Released: 04/06/2008 Document Revised: 09/30/2015 Document Reviewed: 01/20/2015 Elsevier Interactive Patient Education  [employer].

## 2016-12-26 DIAGNOSIS — H43391 Other vitreous opacities, right eye: Secondary | ICD-10-CM | POA: Diagnosis not present

## 2016-12-26 DIAGNOSIS — E119 Type 2 diabetes mellitus without complications: Secondary | ICD-10-CM | POA: Diagnosis not present

## 2016-12-26 DIAGNOSIS — H43821 Vitreomacular adhesion, right eye: Secondary | ICD-10-CM | POA: Diagnosis not present

## 2016-12-26 DIAGNOSIS — H353112 Nonexudative age-related macular degeneration, right eye, intermediate dry stage: Secondary | ICD-10-CM | POA: Diagnosis not present

## 2016-12-26 DIAGNOSIS — H35371 Puckering of macula, right eye: Secondary | ICD-10-CM | POA: Diagnosis not present

## 2017-01-01 ENCOUNTER — Ambulatory Visit (INDEPENDENT_AMBULATORY_CARE_PROVIDER_SITE_OTHER): Payer: PPO | Admitting: Pharmacotherapy

## 2017-01-01 ENCOUNTER — Other Ambulatory Visit: Payer: Self-pay | Admitting: *Deleted

## 2017-01-01 ENCOUNTER — Encounter: Payer: Self-pay | Admitting: Pharmacotherapy

## 2017-01-01 VITALS — BP 136/80 | HR 90 | Resp 12 | Ht 67.0 in | Wt 341.0 lb

## 2017-01-01 DIAGNOSIS — Z7901 Long term (current) use of anticoagulants: Secondary | ICD-10-CM

## 2017-01-01 DIAGNOSIS — I2782 Chronic pulmonary embolism: Secondary | ICD-10-CM | POA: Diagnosis not present

## 2017-01-01 LAB — POCT INR: INR: 1.1

## 2017-01-01 MED ORDER — PRAVASTATIN SODIUM 20 MG PO TABS: ORAL_TABLET | ORAL | 1 refills | Status: DC

## 2017-01-01 NOTE — Progress Notes (Signed)
   Subjective:    Patient ID: [name], female    DOB: 27-Sep-1934, 82 y.o.   MRN: [id number]  HPI Last INR on 11/27/16 was OK at 2.0 Current coumadin dose is 5mg  QD except 7.5mg  T/Th Denies CP, falls Did miss 2 doses of coumadin. Denies unusual bleeding or bruising Consistent with vitamin K intake.   Review of Systems  HENT: Negative for nosebleeds.   Respiratory: Positive for shortness of breath.   Cardiovascular: Negative for chest pain.  Gastrointestinal: Negative for anal bleeding and blood in stool.  Genitourinary: Negative for hematuria.  Hematological: Does not bruise/bleed easily.       Objective:   Physical Exam  Constitutional: She is oriented to person, place, and time. She appears well-developed and well-nourished.  HENT:  Right Ear: External ear normal.  Left Ear: External ear normal.  Cardiovascular: Normal rate, regular rhythm and normal heart sounds.   Pulmonary/Chest: Effort normal and breath sounds normal.  Neurological: She is alert and oriented to person, place, and time.  Skin: Skin is warm and dry.  Psychiatric: She has a normal mood and affect. Her behavior is normal. Judgment and thought content normal.  Vitals reviewed.   BP: 136/80  HR: 90  Wt: 341lb INR 1.1      Assessment & Plan:  1.  INR below goal 2-3 due to missed doses.. 2.  Continue Coumadin 5mg  QD except 7.5mg  T/Th. 3.  Counseled on importance of taking medications as prescribed. 4.  Discussed pro / con of [employer] personal CGM. 5.  RTC 1 month

## 2017-01-01 NOTE — Patient Instructions (Signed)
INR 1.1  Continue Coumadin 5mg  daily except 7.5mg  Tuesdays and Thursdays.

## 2017-01-01 NOTE — Telephone Encounter (Signed)
[facility] [place] 

## 2017-01-16 ENCOUNTER — Encounter: Payer: Self-pay | Admitting: Internal Medicine

## 2017-01-16 ENCOUNTER — Ambulatory Visit (INDEPENDENT_AMBULATORY_CARE_PROVIDER_SITE_OTHER): Payer: PPO | Admitting: Internal Medicine

## 2017-01-16 VITALS — BP 118/64 | HR 79 | Temp 98.2°F

## 2017-01-16 DIAGNOSIS — E782 Mixed hyperlipidemia: Secondary | ICD-10-CM | POA: Diagnosis not present

## 2017-01-16 DIAGNOSIS — E1165 Type 2 diabetes mellitus with hyperglycemia: Secondary | ICD-10-CM | POA: Diagnosis not present

## 2017-01-16 DIAGNOSIS — Z23 Encounter for immunization: Secondary | ICD-10-CM

## 2017-01-16 DIAGNOSIS — G894 Chronic pain syndrome: Secondary | ICD-10-CM | POA: Diagnosis not present

## 2017-01-16 DIAGNOSIS — Z794 Long term (current) use of insulin: Secondary | ICD-10-CM

## 2017-01-16 DIAGNOSIS — T50B95A Adverse effect of other viral vaccines, initial encounter: Secondary | ICD-10-CM | POA: Diagnosis not present

## 2017-01-16 DIAGNOSIS — IMO0002 Reserved for concepts with insufficient information to code with codable children: Secondary | ICD-10-CM

## 2017-01-16 DIAGNOSIS — R413 Other amnesia: Secondary | ICD-10-CM

## 2017-01-16 DIAGNOSIS — E1121 Type 2 diabetes mellitus with diabetic nephropathy: Secondary | ICD-10-CM | POA: Diagnosis not present

## 2017-01-16 MED ORDER — MEMANTINE HCL 5 MG PO TABS: 5.0000 mg | ORAL_TABLET | Freq: Two times a day (BID) | ORAL | 6 refills | Status: DC

## 2017-01-16 MED ORDER — OXYCODONE-ACETAMINOPHEN 10-325 MG PO TABS: 1.0000 | ORAL_TABLET | Freq: Four times a day (QID) | ORAL | 0 refills | Status: DC

## 2017-01-16 NOTE — Patient Instructions (Signed)
STOP ARICEPT  START NAMENDA 2 TIMES DAILY FOR DEMENTIA  Continue other medications as ordered  Flu shot given today  Will call with lab results  Follow up in 1 month for dementia. Will need prevnar at that visit

## 2017-01-16 NOTE — Progress Notes (Signed)
Patient ID: [name], female   DOB: December 14, 1934, 82 y.o.   MRN: [id number]    Location:  [name] Place of Service: OFFICE  Chief Complaint  Patient presents with  . Medical Management of Chronic Issues    1 month follow-up on DM and dementia.Patient c/o SOB, Stopped Aricept "Feels terrible when taking" and also upset stomach, took x 1 week  . Immunizations    Flu and pneumonia to be given today if Dr.Jerardo Costabile agrees   . Medication Refill    No refills needed     HPI:  82 yo female seen today for f/u. She stopped her aricept due to GI upset (rectal urge not followed by BM; loose stools). She has generalized pain "every joint in my body hurts". Needs RF on percocet  Hx DVT - stable on coumadin. INR 1.1 last month. GOAL INR 2-3. She developed hematoma at lovenox injection site which has not healed yet. She has missed 1 or 2 doses.  DM - BS has been fluctuating. Daughter has missplaced Rx assistance forms for [employer]. She gets Novolog 30 units BID; Toujeo 70 units BID. She has polyuria, polydipsia, polyphagia. A1c 12.1%.  low BS reaction x 2 when BS 57 between 0300-0600 hrs.  Chronic bronchitis - improved SOB on symbicort. She has severe cough that is improved on diabetic Tussin  Dry skin dermatitis - itching improved with change to dye free/parfume free detergent.   Pt is a poor historian due to dementia. Hx obtained from chart and daughter  Dementia - MMSE 26/30. She tried aricept but experienced GI upset/loose stools and stopped medication.  Past Medical History:  Diagnosis Date  . Allergy    takes Mucinex daily as needed  . Anemia, unspecified   . Anxiety    takes Clonazepam daily as needed  . Arthritis   . CHF (congestive heart failure) (HCC)    takes Furosemide daily  . Coronary artery disease    a. s/p IMI 2004 tx with BMS to RCA;  b. s/p Promus DES to RCA 2/12 (cath: LM 20-30%, pLAD 20-30%, mLAD 50%, RI 30%, pRCA 60%, mRCA 90% - tx with PCI);  c. myoview 1/07: EF 49%, inf  scar, no isch  . Coronary atherosclerosis of native coronary artery   . Depression    takes Cymbalta daily  . Diabetes mellitus    insulin daily  . Dysuria   . GERD (gastroesophageal reflux disease)    takes Protonix daily  . Gout, unspecified    takes Colchicine daily  . Headache    occasionally  . History of blood clots about 32yr ago   in legs-takes Coumadin   . Hyperlipidemia    takes Pravastatin daily  . Hypertension    takes Imdur and Metoprolol daily  . Insomnia   . Joint pain   . Joint swelling   . Myocardial infarction ([facility] 2004  . Numbness   . Obstructive sleep apnea   . Osteoarthritis   . Osteoarthritis   . Osteoarthrosis, unspecified whether generalized or localized, lower leg   . Pain, chronic   . Pol[place]algia rheumatica ([facility]   . Pulmonary emboli ([facility] 9/13   felt to need lifelong anticoagulation  . Shortness of breath dyspnea    with exertion and has Albuterol inhaler prn  . Type II or unspecified type diabetes mellitus without mention of complication, uncontrolled   . Urinary incontinence    takes Linzess daily  . Vertigo    hx of;was taking Meclizine  if needed    Past Surgical History:  Procedure Laterality Date  . ABDOMINAL HYSTERECTOMY     partial  . APPENDECTOMY    . blood clots/legs and lungs  2013  . BREAST BIOPSY Left 07/22/2014  . BREAST BIOPSY Left 02/10/2013  . BREAST LUMPECTOMY Left 11/05/2014  . BREAST LUMPECTOMY WITH RADIOACTIVE SEED LOCALIZATION Left 11/05/2014   Procedure: LEFT BREAST LUMPECTOMY WITH RADIOACTIVE SEED LOCALIZATION;  Surgeon: [name], MD;  Location: [facility];  Service: General;  Laterality: Left;  . CARDIAC CATHETERIZATION    . COLONOSCOPY    . CORONARY ANGIOPLASTY  2  . ESOPHAGOGASTRODUODENOSCOPY (EGD) WITH PROPOFOL N/A 11/07/2016   Procedure: ESOPHAGOGASTRODUODENOSCOPY (EGD) WITH PROPOFOL;  Surgeon: Gatha Mayer, MD;  Location: [facility];  Service: Endoscopy;  Laterality: N/A;  . EXCISION OF SKIN TAG  Right 11/05/2014   Procedure: EXCISION OF RIGHT EYELID SKIN TAG;  Surgeon: [name], MD;  Location: [facility];  Service: General;  Laterality: Right;  . EYE SURGERY Bilateral    cataract   . GASTRIC BYPASS  1977    reversed in 1979, [place] N/A 06/29/2014   Procedure: LEFT HEART CATHETERIZATION WITH CORONARY ANGIOGRAM;  Surgeon: Troy Sine, MD;  Location: [facility] C[facility] LAB;  Service: Cardiovascular;  Laterality: N/A;  . MI with stent placement  2004    Patient Care Team: Gildardo Cranker, DO as PCP - General (Internal Medicine) [name], [name], MD (Inactive) as Consulting Physician (Cardiology) [name], MD as Consulting Physician (Ophthalmology) [name], MD as Consulting Physician (General Surgery) [name], MD as Referring Physician (Obstetrics and Gynecology)  Social History   Social History  . Marital status: Widowed    Spouse name: N/A  . Number of children: N/A  . Years of education: N/A   Occupational History  . retired    Social History Main Topics  . Smoking status: Never Smoker  . Smokeless tobacco: Never Used  . Alcohol use No  . Drug use: No  . Sexual activity: Not Currently   Other Topics Concern  . Not on file   Social History Narrative  . No narrative on file     reports that she has never smoked. She has never used smokeless tobacco. She reports that she does not drink alcohol or use drugs.  Family History  Problem Relation Age of Onset  . Breast cancer Mother 6  . Heart disease Mother   . Throat cancer Father   . Hypertension Father   . Arthritis Father   . Diabetes Father   . Arthritis Sister   . Obesity Sister   . Diabetes Sister   . Heart disease Cousin   . Colon cancer Neg Hx   . Stomach cancer Neg Hx   . Esophageal cancer Neg Hx    Family Status  Relation Status  . Mother Deceased at age 55       Cause of Death: Breast cancer  . Father Deceased at age 54         Cause of Death: Complications of diabetes & HTN  . Sister Deceased at age 67  . Sister Alive  . Daughter Alive  . Son Alive  . Sister Alive  . Sister Alive  . Brother Alive  . Daughter Alive  . MGM Deceased  . MGF Deceased  . PGM Deceased  . PGF Deceased  . Cousin Deceased  . Neg Hx (Not Specified)  Allergies  Allergen Reactions  . Sulfonamide Derivatives Swelling    Mouth swelling  . Tramadol Nausea And Vomiting    Medications: Patient's Medications  New Prescriptions   No medications on file  Previous Medications   ALBUTEROL (PROVENTIL HFA;VENTOLIN HFA) 108 (90 BASE) MCG/ACT INHALER    Inhale 1-2 puffs into the lungs every 6 (six) hours as needed for wheezing or shortness of breath.   ANASTROZOLE (ARIMIDEX) 1 MG TABLET    Take 1 tablet (1 mg total) by mouth daily.   BUDESONIDE-FORMOTEROL (SYMBICORT) 160-4.5 MCG/ACT INHALER    Inhale 2 puffs into the lungs 2 (two) times daily. For bronchitis   CETIRIZINE (ZYRTEC) 10 MG TABLET    Take 10 mg by mouth at bedtime.   DOCUSATE SODIUM (COLACE) 100 MG CAPSULE    Take 100 mg by mouth daily as needed for mild constipation. TAKE PER BOTTLE AS NEEDED FOR CONSTIPATION    DULOXETINE (CYMBALTA) 60 MG CAPSULE    TAKE ONE CAPSULE BY MOUTH ONCE DAILY FOR ANXIETY   HYDROXYZINE (ATARAX/VISTARIL) 10 MG TABLET    Take 1 tablet (10 mg total) by mouth 3 (three) times daily as needed for itching.   INSULIN ASPART (NOVOLOG FLEXPEN) 100 UNIT/ML FLEXPEN    Inject 30 Units into the skin 2 (two) times daily.   INSULIN GLARGINE (TOUJEO SOLOSTAR) 300 UNIT/ML SOPN    Inject 70 Units into the skin 2 (two) times daily. (before breakfast and before supper)   ISOSORBIDE MONONITRATE (IMDUR) 60 MG 24 HR TABLET    Take 1 tablet (60 mg total) by mouth daily.   LANCETS (ONETOUCH ULTRASOFT) LANCETS    Check blood sugar 2-3 times daily as directed   LOSARTAN (COZAAR) 25 MG TABLET    Take 1 tablet (25 mg total) by mouth daily.   MECLIZINE (ANTIVERT) 25 MG  TABLET    Take 1 tablet (25 mg total) by mouth 3 (three) times daily as needed for dizziness.   METOPROLOL SUCCINATE (TOPROL-XL) 25 MG 24 HR TABLET    TAKE ONE TABLET BY MOUTH ONCE DAILY FOR BLOOD PRESSURE   NITROGLYCERIN (NITROSTAT) 0.4 MG SL TABLET    Take one tablet under the tongue every 5 minutes as needed for chest pain   ONETOUCH VERIO TEST STRIP    Use to test blood sugar three times daily. Dx: E11.00   OXYCODONE-ACETAMINOPHEN (PERCOCET) 10-325 MG TABLET    Take 1 tablet by mouth every 6 (six) hours as needed for pain.   PANTOPRAZOLE (PROTONIX) 40 MG TABLET    TAKE ONE TABLET BY MOUTH ONCE DAILY   PRAVASTATIN (PRAVACHOL) 20 MG TABLET    Take one tablet by mouth once daily for cholesterol   SODIUM CHLORIDE (OCEAN) 0.65 % SOLN NASAL SPRAY    Place 1 spray into both nostrils 3 (three) times daily as needed (for sinus congestion.). RINSE AS NEEDED FOR SINUS CONGESTION    SPACER/AERO-HOLDING CHAMBERS (AEROCHAMBER PLUS FLO-VU LARGE) MISC    1 each by Other route once.   ULORIC 40 MG TABLET    TAKE ONE TABLET BY MOUTH ONCE DAILY   WARFARIN (COUMADIN) 5 MG TABLET    Take 1 tablet daily except 1 & 1/2 tablets on Tuesdays and Thursdays  Modified Medications   No medications on file  Discontinued Medications   DONEPEZIL (ARICEPT) 5 MG TABLET    Take 1 tablet (5 mg total) by mouth at bedtime.    Review of Systems  Unable to perform ROS: Dementia (memory  loss)    Vitals:   01/16/17 1357  BP: 118/64  Pulse: 79  Temp: 98.2 F (36.8 C)  TempSrc: Oral  SpO2: 96%   There is no height or weight on file to calculate BMI.  Physical Exam  Constitutional: She appears well-developed and well-nourished.  HENT:  Mouth/Throat: Oropharynx is clear and moist. No oropharyngeal exudate.  MMM; no oral thrush  Eyes: Pupils are equal, round, and reactive to light. No scleral icterus.  Neck: Neck supple. Carotid bruit is not present. No tracheal deviation present. No thyromegaly present.  Cardiovascular:  Normal rate, regular rhythm and intact distal pulses.  Exam reveals no gallop and no friction rub.   Murmur (1/6 SEM) heard. No LE edema b/l. no calf TTP.   Pulmonary/Chest: Effort normal and breath sounds normal. No stridor. No respiratory distress. She has no wheezes. She has no rales.  Abdominal: Soft. Normal appearance and bowel sounds are normal. She exhibits no distension and no mass. There is no hepatomegaly. There is no tenderness. There is no rigidity, no rebound and no guarding. No hernia.  obesity  Musculoskeletal: She exhibits edema.  Lymphadenopathy:    She has no cervical adenopathy.  Neurological: She is alert.  Skin: Skin is warm and dry. No rash noted.  Psychiatric: She has a normal mood and affect. Her behavior is normal.     Labs reviewed: Office Visit on 01/01/2017  Component Date Value Ref Range Status  . INR 01/01/2017 1.1   Final  Office Visit on 11/27/2016  Component Date Value Ref Range Status  . INR 11/27/2016 2.0   Final  Office Visit on 11/22/2016  Component Date Value Ref Range Status  . INR 11/22/2016 2.0   Final  Office Visit on 11/15/2016  Component Date Value Ref Range Status  . INR 11/15/2016 1.4   Final  Abstract on 11/14/2016  Component Date Value Ref Range Status  . INR 11/14/2016 1.9* 0.9 - 1.1 Final  . Protime 11/14/2016 18.9* 10.0 - 13.8 Final  Admission on 11/07/2016, Discharged on 11/07/2016  Component Date Value Ref Range Status  . Glucose-Capillary 11/07/2016 92  65 - 99 mg/dL Final  Office Visit on 11/06/2016  Component Date Value Ref Range Status  . INR 11/06/2016 2.8   Final  Orders Only on 10/19/2016  Component Date Value Ref Range Status  . Organism ID, Bacteria 10/19/2016    Final                   Value:Three or more organisms present,each greater than 10,000 CFU/mL.These organisms,commonly found on external and internal genitalia,are considered to be colonizers.No further testing performed.   Lab on 10/19/2016    Component Date Value Ref Range Status  . Color, Urine 10/19/2016 YELLOW  YELLOW Final  . APPearance 10/19/2016 CLEAR  CLEAR Final  . Specific Gravity, Urine 10/19/2016 1.008  1.001 - 1.035 Final  . pH 10/19/2016 6.0  5.0 - 8.0 Final  . Glucose, UA 10/19/2016 NEGATIVE  NEGATIVE Final  . Bilirubin Urine 10/19/2016 NEGATIVE  NEGATIVE Final  . Ketones, ur 10/19/2016 NEGATIVE  NEGATIVE Final  . Hgb urine dipstick 10/19/2016 TRACE* NEGATIVE Final  . Protein, ur 10/19/2016 NEGATIVE  NEGATIVE Final  . Nitrite 10/19/2016 NEGATIVE  NEGATIVE Final  . Leukocytes, UA 10/19/2016 2+* NEGATIVE Final  . WBC, UA 10/19/2016 40-60* <=5 WBC/HPF Final  . RBC / HPF 10/19/2016 NONE SEEN  <=2 RBC/HPF Final  . Squamous Epithelial / LPF 10/19/2016 NONE SEEN  <=5  HPF Final  . Bacteria, UA 10/19/2016 NONE SEEN  NONE SEEN HPF Final  . Crystals 10/19/2016 NONE SEEN  NONE SEEN HPF Final  . Casts 10/19/2016 NONE SEEN  NONE SEEN LPF Final  . Yeast 10/19/2016 NONE SEEN  NONE SEEN HPF Final    No results found.   Assessment/Plan   ICD-10-CM   1. Chronic pain syndrome G89.4 oxyCODONE-acetaminophen (PERCOCET) 10-325 MG tablet  2. Uncontrolled type 2 diabetes mellitus with diabetic nephropathy, with long-term current use of insulin (HCC) E11.21 Hemoglobin A1c   E11.65 Lipid Panel   Z79.4 CMP with eGFR  3. Memory loss R41.3 memantine (NAMENDA) 5 MG tablet  4. Mixed hyperlipidemia E78.2 Lipid Panel  5. Encounter for immunization Z23 Flu vaccine HIGH DOSE PF  6. Adverse effect of influenza vaccine, initial encounter T50.B95A    high dose   Continue to monitor her 2/2 rxn to high dose flu vaccine - pt experienced diaphoresis, dizziness, lightheadedness almost immediately after receiving vaccine dose. She was monitored >15 minutes and her sx's improved. She was given instructions to go to the ED if she developed CP, SOB or syncope and f/c.  STOP ARICEPT  START NAMENDA 2 TIMES DAILY FOR DEMENTIA  Continue other  medications as ordered  Flu shot given today  Will call with lab results  Follow up in 1 month for dementia. Will need prevnar at that visit    [facility] S. Perlie Gold  [facility] [address] [place], [place] [address] [phone] Cell (Monday-Friday 8 AM - 5 PM) [phone] After 5 PM and follow prompts

## 2017-01-17 LAB — COMPLETE METABOLIC PANEL WITH GFR
AG Ratio: 0.8 (calc) — ABNORMAL LOW (ref 1.0–2.5)
ALT: 9 U/L (ref 6–29)
AST: 15 U/L (ref 10–35)
Albumin: 3.2 g/dL — ABNORMAL LOW (ref 3.6–5.1)
Alkaline phosphatase (APISO): 90 U/L (ref 33–130)
BUN/Creatinine Ratio: 18 (calc) (ref 6–22)
BUN: 27 mg/dL — ABNORMAL HIGH (ref 7–25)
CO2: 25 mmol/L (ref 20–32)
Calcium: 8.7 mg/dL (ref 8.6–10.4)
Chloride: 105 mmol/L (ref 98–110)
Creat: 1.52 mg/dL — ABNORMAL HIGH (ref 0.60–0.88)
GFR, Est African American: 37 mL/min/{1.73_m2} — ABNORMAL LOW (ref >=60)
GFR, Est Non African American: 32 mL/min/{1.73_m2} — ABNORMAL LOW (ref >=60)
Globulin: 4.2 g/dL (calc) — ABNORMAL HIGH (ref 1.9–3.7)
Glucose, Bld: 44 mg/dL — ABNORMAL LOW (ref 65–139)
Potassium: 4.4 mmol/L (ref 3.5–5.3)
Sodium: 137 mmol/L (ref 135–146)
Total Bilirubin: 0.3 mg/dL (ref 0.2–1.2)
Total Protein: 7.4 g/dL (ref 6.1–8.1)

## 2017-01-17 LAB — LIPID PANEL
Cholesterol: 114 mg/dL (ref <200)
HDL: 38 mg/dL — ABNORMAL LOW (ref >50)
LDL Cholesterol (Calc): 53 mg/dL (calc)
Non-HDL Cholesterol (Calc): 76 mg/dL (calc) (ref <130)
Total CHOL/HDL Ratio: 3 (calc) (ref <5.0)
Triglycerides: 149 mg/dL (ref <150)

## 2017-01-17 LAB — HEMOGLOBIN A1C
Hgb A1c MFr Bld: 9.6 % of total Hgb — ABNORMAL HIGH (ref <5.7)
Mean Plasma Glucose: 229 (calc)
eAG (mmol/L): 12.7 (calc)

## 2017-01-18 ENCOUNTER — Telehealth: Payer: Self-pay

## 2017-01-18 MED ORDER — INSULIN ASPART 100 UNIT/ML FLEXPEN: 34.0000 [IU] | PEN_INJECTOR | Freq: Two times a day (BID) | SUBCUTANEOUS | 6 refills | Status: DC

## 2017-01-18 NOTE — Telephone Encounter (Signed)
-----   Message from [place], [place] sent at 01/18/2017 10:28 AM EDT ----- Diabetes improving but still uncontrolled - increase novolog to 34 units 2 times daily; reduced kidney function; bad/LDL cholesterol at goal; reduced nutritional status - need to eat 3 healthy meals per day at least; other labs stable

## 2017-01-18 NOTE — Telephone Encounter (Signed)
Medication list updated and refill sent based on recent lab results.

## 2017-01-25 ENCOUNTER — Other Ambulatory Visit: Payer: Self-pay | Admitting: *Deleted

## 2017-01-25 ENCOUNTER — Other Ambulatory Visit: Payer: Self-pay | Admitting: Nurse Practitioner

## 2017-01-25 DIAGNOSIS — R05 Cough: Secondary | ICD-10-CM

## 2017-01-25 DIAGNOSIS — R059 Cough, unspecified: Secondary | ICD-10-CM

## 2017-01-25 MED ORDER — METOPROLOL SUCCINATE ER 25 MG PO TB24: ORAL_TABLET | ORAL | 1 refills | Status: DC

## 2017-01-25 NOTE — Telephone Encounter (Signed)
[facility] 

## 2017-01-29 ENCOUNTER — Other Ambulatory Visit: Payer: Self-pay | Admitting: Internal Medicine

## 2017-01-29 ENCOUNTER — Ambulatory Visit (INDEPENDENT_AMBULATORY_CARE_PROVIDER_SITE_OTHER): Payer: PPO | Admitting: Pharmacotherapy

## 2017-01-29 VITALS — BP 136/80 | HR 84 | Temp 98.2°F | Wt 344.2 lb

## 2017-01-29 DIAGNOSIS — Z7901 Long term (current) use of anticoagulants: Secondary | ICD-10-CM

## 2017-01-29 DIAGNOSIS — I2782 Chronic pulmonary embolism: Secondary | ICD-10-CM | POA: Diagnosis not present

## 2017-01-29 NOTE — Patient Instructions (Signed)
INR 2.9  Continue Coumadin 5mg  daily except 7.5mg  (1 & 1/2 tablet) on Tuesdays and Thursdays

## 2017-01-29 NOTE — Progress Notes (Signed)
   Subjective:    Patient ID: [name], female    DOB: August 26, 1934, 82 y.o.   MRN: [id number]  HPI Last INR on 827/18 was low at 1.1 Current Coumadin dose is 5mg  QD except 7.5mg  T/Th Denies CP, SOB, falls Denies missed doses. Denies  Unusual bleeding or bruising. Consistent with vitamin K intake.   Review of Systems  HENT: Negative for nosebleeds.   Respiratory: Negative for shortness of breath.   Cardiovascular: Negative for chest pain.  Gastrointestinal: Negative for anal bleeding and blood in stool.  Genitourinary: Negative for hematuria.  Hematological: Does not bruise/bleed easily.       Objective:   Physical Exam  Constitutional: She is oriented to person, place, and time. She appears well-developed and well-nourished.  HENT:  Right Ear: External ear normal.  Left Ear: External ear normal.  Cardiovascular: Normal rate, regular rhythm and normal heart sounds.   Pulmonary/Chest: Effort normal and breath sounds normal.  Neurological: She is alert and oriented to person, place, and time.  Skin: Skin is warm and dry.  Psychiatric: She has a normal mood and affect. Her behavior is normal. Judgment and thought content normal.  Vitals reviewed.  BP: 136/80  HR: 84  Wt: 344lb INR 2.9       Assessment & Plan:  1.  INR at goal 2-3 2.  Continue Coumadin 5mg  daily except 7.5mg  T/Th 3.  RTC in 1 month

## 2017-02-05 DIAGNOSIS — R8271 Bacteriuria: Secondary | ICD-10-CM | POA: Diagnosis not present

## 2017-02-05 DIAGNOSIS — N952 Postmenopausal atrophic vaginitis: Secondary | ICD-10-CM | POA: Diagnosis not present

## 2017-02-05 DIAGNOSIS — R300 Dysuria: Secondary | ICD-10-CM | POA: Diagnosis not present

## 2017-02-14 ENCOUNTER — Telehealth: Payer: Self-pay

## 2017-02-14 ENCOUNTER — Other Ambulatory Visit: Payer: Self-pay | Admitting: *Deleted

## 2017-02-14 DIAGNOSIS — G894 Chronic pain syndrome: Secondary | ICD-10-CM

## 2017-02-14 MED ORDER — OXYCODONE-ACETAMINOPHEN 10-325 MG PO TABS: 1.0000 | ORAL_TABLET | Freq: Four times a day (QID) | ORAL | 0 refills | Status: DC

## 2017-02-14 NOTE — Telephone Encounter (Signed)
Patient requested and will pick up [place] verified.

## 2017-02-14 NOTE — Telephone Encounter (Signed)
Attempted to call patient to let her know that she has a prescription ready to be picked up. There was no answer and I was unable to leave a message due to no voicemail.

## 2017-02-20 ENCOUNTER — Other Ambulatory Visit: Payer: PPO

## 2017-02-22 ENCOUNTER — Other Ambulatory Visit: Payer: Self-pay

## 2017-02-22 MED ORDER — PANTOPRAZOLE SODIUM 40 MG PO TBEC: 40.0000 mg | DELAYED_RELEASE_TABLET | Freq: Every day | ORAL | 3 refills | Status: DC

## 2017-02-26 ENCOUNTER — Ambulatory Visit (INDEPENDENT_AMBULATORY_CARE_PROVIDER_SITE_OTHER): Payer: PPO | Admitting: Pharmacotherapy

## 2017-02-26 DIAGNOSIS — Z7901 Long term (current) use of anticoagulants: Secondary | ICD-10-CM | POA: Diagnosis not present

## 2017-02-26 LAB — POCT INR: INR: 2.5

## 2017-02-26 MED ORDER — INSULIN ASPART 100 UNIT/ML FLEXPEN: 34.0000 [IU] | PEN_INJECTOR | Freq: Two times a day (BID) | SUBCUTANEOUS | 6 refills | Status: DC

## 2017-02-26 NOTE — Patient Instructions (Signed)
INR 2.5  Continue Coumadin 5mg  daily except 7.5mg  on Tuesdays and Thursday

## 2017-02-26 NOTE — Progress Notes (Signed)
   Subjective:    Patient ID: [name], female    DOB: 1934-09-13, 82 y.o.   MRN: [id number]  HPI Current Coumadin dose is 5mg  QD except 7.5mg  T/Th. Denies missed doses. Denies unusual bleeding or bruising. Denies CP, falls Consistent with vitamin K intake.  Review of Systems  HENT: Negative for nosebleeds.   Cardiovascular: Negative for chest pain.  Gastrointestinal: Negative for anal bleeding and blood in stool.  Genitourinary: Negative for hematuria.  Hematological: Does not bruise/bleed easily.       Objective:   Physical Exam  Constitutional: She is oriented to person, place, and time. She appears well-developed and well-nourished.  HENT:  Right Ear: External ear normal.  Left Ear: External ear normal.  Cardiovascular: Normal rate, regular rhythm and normal heart sounds.   Pulmonary/Chest: Effort normal and breath sounds normal.  Neurological: She is alert and oriented to person, place, and time.  Skin: Skin is warm and dry.  Psychiatric: She has a normal mood and affect. Her behavior is normal. Judgment and thought content normal.  Vitals reviewed.   BP: 138/76, HR: 89, wt: 343lb  INR 2.5    Assessment & Plan:  1.  INR at goal 2-3 2.  Continue Coumadin 5mg  QD except 7.5mg  T/Th 3.  RTC 1 month

## 2017-02-27 ENCOUNTER — Encounter: Payer: Self-pay | Admitting: Internal Medicine

## 2017-02-27 ENCOUNTER — Ambulatory Visit (INDEPENDENT_AMBULATORY_CARE_PROVIDER_SITE_OTHER): Payer: PPO | Admitting: Internal Medicine

## 2017-02-27 VITALS — BP 132/84 | HR 81 | Temp 98.2°F | Wt 343.0 lb

## 2017-02-27 DIAGNOSIS — E1165 Type 2 diabetes mellitus with hyperglycemia: Secondary | ICD-10-CM | POA: Diagnosis not present

## 2017-02-27 DIAGNOSIS — E1121 Type 2 diabetes mellitus with diabetic nephropathy: Secondary | ICD-10-CM

## 2017-02-27 DIAGNOSIS — Z23 Encounter for immunization: Secondary | ICD-10-CM

## 2017-02-27 DIAGNOSIS — Z794 Long term (current) use of insulin: Secondary | ICD-10-CM

## 2017-02-27 DIAGNOSIS — R413 Other amnesia: Secondary | ICD-10-CM

## 2017-02-27 DIAGNOSIS — IMO0002 Reserved for concepts with insufficient information to code with codable children: Secondary | ICD-10-CM

## 2017-02-27 NOTE — Progress Notes (Signed)
Patient ID: [name], female   DOB: 1934-11-30, 82 y.o.   MRN: [id number]   Location:  [facility] clinic  Provider:    Code Status:  Goals of Care:  Advanced Directives 12/06/2016  Does Patient Have a Medical Advance Directive? No  Type of Advance Directive -  Does patient want to make changes to medical advance directive? -  Copy of College in Chart? -  Would patient like information on creating a medical advance directive? -  Pre-existing out of facility DNR order (yellow form or pink MOST form) -     Chief Complaint  Patient presents with  . Medical Management of Chronic Issues    Medical Management of Dementia    HPI: Patient is a 82 y.o. female seen today for medical management of chronic diseases. States some pain around her denture on the right side of lower jaw where denture wraps around tooth x 1 day. Rates  Pain at 8/10. Nothing makes pain better. When she opens her mouth really wide the pain is worse. She states she is seeing Dr. Ladona Horns for this problem.  She wants pneumonia vaccination today.  She state she has no problems with diarrhea since changing her medication. She has been taking Namenda twice a day.  Hx DVT - stable on coumadin. INR 1.1 last month. GOAL INR 2-3. She developed hematoma at lovenox injection site which has not healed yet. She has missed 1 or 2 doses.  DM - BS has been fluctuating. Daughter has missplaced Rx assistance forms for [employer]. She is prescribed Novolog 30 units BID; and Toujeo 70 unites BID. She states she was having occurrences of low blood sugar. She states Oretha Ellis changed her dosage of insulin to 25 units of novolog and 60 units of toujeo. However, [facility] is following this patient for coumadin not her diabetes. She is followed by Dr. Eulas Post for her DM. She has polyuria, polydipsia, polyphagia. A1c 9.6%. She states she was having occurences of low BS.   Chronic bronchitis - improved SOB on symbicort. She has  severe cough that is improved on diabetic Tussin.  Dry skin dermatitis - itching improved with change to dye free/parfume free detergent.   Pt is a poor historian due to dementia. Hx obtained from chart and daughter  Dementia - MMSE 26/30. She tried aricept but experienced GI upset/loose stools and stopped medication.   Past Medical History:  Diagnosis Date  . Allergy    takes Mucinex daily as needed  . Anemia, unspecified   . Anxiety    takes Clonazepam daily as needed  . Arthritis   . CHF (congestive heart failure) (HCC)    takes Furosemide daily  . Coronary artery disease    a. s/p IMI 2004 tx with BMS to RCA;  b. s/p Promus DES to RCA 2/12 (cath: LM 20-30%, pLAD 20-30%, mLAD 50%, RI 30%, pRCA 60%, mRCA 90% - tx with PCI);  c. myoview 1/07: EF 49%, inf scar, no isch  . Coronary atherosclerosis of native coronary artery   . Depression    takes Cymbalta daily  . Diabetes mellitus    insulin daily  . Dysuria   . GERD (gastroesophageal reflux disease)    takes Protonix daily  . Gout, unspecified    takes Colchicine daily  . Headache    occasionally  . History of blood clots about 58yrs ago   in legs-takes Coumadin   . Hyperlipidemia    takes Pravastatin daily  .  Hypertension    takes Imdur and Metoprolol daily  . Insomnia   . Joint pain   . Joint swelling   . Myocardial infarction ([place]) 2004  . Numbness   . Obstructive sleep apnea   . Osteoarthritis   . Osteoarthritis   . Osteoarthrosis, unspecified whether generalized or localized, lower leg   . Pain, chronic   . Polymyalgia rheumatica ([place])   . Pulmonary emboli ([place]) 9/13   felt to need lifelong anticoagulation  . Shortness of breath dyspnea    with exertion and has Albuterol inhaler prn  . Type II or unspecified type diabetes mellitus without mention of complication, uncontrolled   . Urinary incontinence    takes Linzess daily  . Vertigo    hx of;was taking Meclizine if needed    Past Surgical History:   Procedure Laterality Date  . ABDOMINAL HYSTERECTOMY     partial  . APPENDECTOMY    . blood clots/legs and lungs  2013  . BREAST BIOPSY Left 07/22/2014  . BREAST BIOPSY Left 02/10/2013  . BREAST LUMPECTOMY Left 11/05/2014  . BREAST LUMPECTOMY WITH RADIOACTIVE SEED LOCALIZATION Left 11/05/2014   Procedure: LEFT BREAST LUMPECTOMY WITH RADIOACTIVE SEED LOCALIZATION;  Surgeon: [name], MD;  Location: [place];  Service: General;  Laterality: Left;  . CARDIAC CATHETERIZATION    . COLONOSCOPY    . CORONARY ANGIOPLASTY  2  . ESOPHAGOGASTRODUODENOSCOPY (EGD) WITH PROPOFOL N/A 11/07/2016   Procedure: ESOPHAGOGASTRODUODENOSCOPY (EGD) WITH PROPOFOL;  Surgeon: Gatha Mayer, MD;  [facility]: [facility];  Service: Endoscopy;  Laterality: N/A;  . EXCISION OF SKIN TAG Right 11/05/2014   Procedure: EXCISION OF RIGHT EYELID SKIN TAG;  Surgeon: [name], MD;  Location: [place];  Service: General;  Laterality: Right;  . EYE SURGERY Bilateral    cataract   . GASTRIC BYPASS  1977    reversed in 1979, [place] N/A 06/29/2014   Procedure: LEFT HEART CATHETERIZATION WITH CORONARY ANGIOGRAM;  Surgeon: Troy Sine, MD;  Location: [facility] CATH LAB;  Service: Cardiovascular;  Laterality: N/A;  . MI with stent placement  2004    Allergies  Allergen Reactions  . Sulfonamide Derivatives Swelling    Mouth swelling  . Tramadol Nausea And Vomiting  . Aricept [Donepezil Hcl]     GI upset/loose stools    Outpatient Encounter Prescriptions as of 02/27/2017  Medication Sig  . albuterol (PROVENTIL HFA;VENTOLIN HFA) 108 (90 Base) MCG/ACT inhaler Inhale 1-2 puffs into the lungs every 6 (six) hours as needed for wheezing or shortness of breath.  . anastrozole (ARIMIDEX) 1 MG tablet Take 1 tablet (1 mg total) by mouth daily.  . budesonide-formoterol (SYMBICORT) 160-4.5 MCG/ACT inhaler Inhale 2 puffs into the lungs 2 (two) times daily. For bronchitis  .  cetirizine (ZYRTEC) 10 MG tablet Take 10 mg by mouth at bedtime.  . docusate sodium (COLACE) 100 MG capsule Take 100 mg by mouth daily as needed for mild constipation. TAKE PER BOTTLE AS NEEDED FOR CONSTIPATION   . DULoxetine (CYMBALTA) 60 MG capsule TAKE 1 CAPSULE BY MOUTH EVERY DAY FOR anxiety  . estradiol (ESTRACE) 0.1 MG/GM vaginal cream Use every other evening use in vaginal area  . hydrOXYzine (ATARAX/VISTARIL) 10 MG tablet Take 1 tablet (10 mg total) by mouth 3 (three) times daily as needed for itching.  . insulin aspart (NOVOLOG FLEXPEN) 100 UNIT/ML FlexPen Inject 34 Units into the skin 2 (two) times daily.  . Insulin Glargine (  TOUJEO SOLOSTAR) 300 UNIT/ML SOPN Inject 70 Units into the skin 2 (two) times daily. (before breakfast and before supper)  . isosorbide mononitrate (IMDUR) 60 MG 24 hr tablet Take 1 tablet (60 mg total) by mouth daily.  . Lancets (ONETOUCH ULTRASOFT) lancets Check blood sugar 2-3 times daily as directed  . losartan (COZAAR) 25 MG tablet TAKE 1 TABLET BY MOUTH ONCE daily  . meclizine (ANTIVERT) 25 MG tablet Take 1 tablet (25 mg total) by mouth 3 (three) times daily as needed for dizziness.  . memantine (NAMENDA) 5 MG tablet Take 1 tablet (5 mg total) by mouth 2 (two) times daily.  . metoprolol succinate (TOPROL-XL) 25 MG 24 hr tablet Take one tablet by mouth once daily for blood pressure  . nitroGLYCERIN (NITROSTAT) 0.4 MG SL tablet Take one tablet under the tongue every 5 minutes as needed for chest pain  . ONETOUCH VERIO test strip Use to test blood sugar three times daily. Dx: E11.00  . oxyCODONE-acetaminophen (PERCOCET) 10-325 MG tablet Take 1 tablet by mouth every 6 (six) hours.  . pantoprazole (PROTONIX) 40 MG tablet Take 1 tablet (40 mg total) by mouth daily.  . pravastatin (PRAVACHOL) 20 MG tablet Take one tablet by mouth once daily for cholesterol  . sodium chloride (OCEAN) 0.65 % SOLN nasal spray Place 1 spray into both nostrils 3 (three) times daily as  needed (for sinus congestion.). RINSE AS NEEDED FOR SINUS CONGESTION   . Spacer/Aero-Holding Chambers (AEROCHAMBER PLUS FLO-VU LARGE) MISC 1 each by Other route once.  [name] ULORIC 40 MG tablet TAKE ONE TABLET BY MOUTH ONCE DAILY  . warfarin (COUMADIN) 5 MG tablet Take 1 tablet daily except 1 & 1/2 tablets on Tuesdays and Thursdays   No facility-administered encounter medications on file as of 02/27/2017.     Review of Systems:  Review of Systems  Unable to perform ROS: Dementia    Health Maintenance  Topic Date Due  . PNA vac Low Risk Adult (2 of 2 - PCV13) 05/09/2003  . OPHTHALMOLOGY EXAM  04/25/2017  . HEMOGLOBIN A1C  07/16/2017  . MAMMOGRAM  08/16/2017  . FOOT EXAM  12/12/2017  . TETANUS/TDAP  05/08/2018  . INFLUENZA VACCINE  Completed  . DEXA SCAN  Completed    Physical Exam: Vitals:   02/27/17 1303  BP: 132/84  Pulse: 81  Temp: 98.2 F (36.8 C)  TempSrc: Oral  SpO2: 95%  Weight: (!) 343 lb (155.6 kg)   Body mass index is 53.72 kg/m. Physical Exam  Constitutional: She is oriented to person, place, and time. She appears well-developed and well-nourished. No distress.  HENT:  Head: Normocephalic and atraumatic.  Mouth/Throat: Oropharynx is clear and moist. No oropharyngeal exudate.  Eyes: Pupils are equal, round, and reactive to light. Right eye exhibits no discharge. Left eye exhibits no discharge. No scleral icterus.  Neck: Normal range of motion. Neck supple. No tracheal deviation present.  Cardiovascular: Normal rate, regular rhythm and intact distal pulses.   Murmur heard. Pulmonary/Chest: Effort normal and breath sounds normal. No respiratory distress. She has no wheezes.  Abdominal: Soft. Bowel sounds are normal. She exhibits no mass. There is no tenderness.  Musculoskeletal: Normal range of motion. She exhibits edema (bilateral lower extremities ).  Neurological: She is alert and oriented to person, place, and time.  Skin: Skin is warm and dry. She is not  diaphoretic.  Psychiatric: She has a normal mood and affect. Her behavior is normal.    Labs reviewed: Basic Metabolic  Panel:  Recent Labs  08/02/16 1551  09/25/16 1133 10/13/16 1237 01/16/17 1516  NA  --   < > 135 131* 137  K  --   < > 4.9 4.7 4.4  CL  --   --  99 97* 105  CO2  --   < > 23 26 25   GLUCOSE  --   < > 285* 408* 44*  BUN  --   < > 19 20 27*  CREATININE  --   < > 1.31* 1.25* 1.52*  CALCIUM  --   < > 8.8 8.7 8.7  TSH 1.52  --   --   --   --   < > = values in this interval not displayed. Liver Function Tests:  Recent Labs  04/24/16 1247 07/27/16 1027 09/20/16 0923 01/16/17 1516  AST 12 14 11 15   ALT 8 9 12 9   ALKPHOS 73 84 92  --   BILITOT 0.3 0.3 0.44 0.3  PROT 7.4 7.5 7.5 7.4  ALBUMIN 3.3* 3.3* 2.8*  --    No results for input(s): LIPASE, AMYLASE in the last 8760 hours. No results for input(s): AMMONIA in the last 8760 hours. CBC:  Recent Labs  07/27/16 1027 08/11/16 1130 09/20/16 0923  WBC 14.6* 13.5* 10.7*  NEUTROABS 10,658* 8,910* 6.9*  HGB 9.9* 9.3* 8.9*  HCT 32.4* 29.7* 29.1*  MCV 84.6 84.4 87.1  PLT 361 356 282   Lipid Panel:  Recent Labs  06/23/16 1455 10/13/16 1230 01/16/17 1516  CHOL 102 99 114  HDL 36* 34* 38*  LDLCALC 47 41  --   TRIG 95 118 149  CHOLHDL 2.8 2.9 3.0   Lab Results  Component Value Date   HGBA1C 9.6 (H) 01/16/2017    Procedures since last visit: No results found.  Assessment/Plan    Labs/tests ordered:  Next appt:  03/27/2017

## 2017-02-27 NOTE — Progress Notes (Signed)
Patient ID: [name], female   DOB: 1935/05/01, 82 y.o.   MRN: [id number]    Location:  [name] Place of Service: OFFICE  Chief Complaint  Patient presents with  . Medical Management of Chronic Issues    Medical Management of Dementia    HPI:  82 yo female seen today for dementia f/u. She reports no issues with namenda tx.   DM - BS stable at home; usually <100. She attempts to avoid complex carbs but loves corn. She consumes approx 2 meals per day. She reduced amt of insulin she uses due to frequent low BS reaction (50s).  She is a poor historian due to memory loss. Hx obtained from chart  Past Medical History:  Diagnosis Date  . Allergy    takes Mucinex daily as needed  . Anemia, unspecified   . Anxiety    takes Clonazepam daily as needed  . Arthritis   . CHF (congestive heart failure) (HCC)    takes Furosemide daily  . Coronary artery disease    a. s/p IMI 2004 tx with BMS to RCA;  b. s/p Promus DES to RCA 2/12 (cath: LM 20-30%, pLAD 20-30%, mLAD 50%, RI 30%, pRCA 60%, mRCA 90% - tx with PCI);  c. myoview 1/07: EF 49%, inf scar, no isch  . Coronary atherosclerosis of native coronary artery   . Depression    takes Cymbalta daily  . Diabetes mellitus    insulin daily  . Dysuria   . GERD (gastroesophageal reflux disease)    takes Protonix daily  . Gout, unspecified    takes Colchicine daily  . Headache    occasionally  . History of blood clots about 110yr ago   in legs-takes Coumadin   . Hyperlipidemia    takes Pravastatin daily  . Hypertension    takes Imdur and Metoprolol daily  . Insomnia   . Joint pain   . Joint swelling   . [place]  . Numbness   . Obstructive sleep apnea   . Osteoarthritis   . Osteoarthritis   . Osteoarthrosis, unspecified whether generalized or localized, lower leg   . Pain, chronic   . [place] ([place]   . [place] ([place] 9/13   felt to need lifelong anticoagulation  . Shortness of  breath dyspnea    with exertion and has Albuterol inhaler prn  . Type II or unspecified type diabetes mellitus without mention of complication, uncontrolled   . Urinary incontinence    takes Linzess daily  . Vertigo    hx of;was taking Meclizine if needed    Past Surgical History:  Procedure Laterality Date  . ABDOMINAL HYSTERECTOMY     partial  . APPENDECTOMY    . blood clots/legs and lungs  2013  . BREAST BIOPSY Left 07/22/2014  . BREAST BIOPSY Left 02/10/2013  . BREAST LUMPECTOMY Left 11/05/2014  . BREAST LUMPECTOMY WITH RADIOACTIVE SEED LOCALIZATION Left 11/05/2014   Procedure: LEFT BREAST LUMPECTOMY WITH RADIOACTIVE SEED LOCALIZATION;  Surgeon: DCoralie Keens MD;  [place]  Service: General;  Laterality: Left;  . CARDIAC CATHETERIZATION    . COLONOSCOPY    . CORONARY ANGIOPLASTY  2  . ESOPHAGOGASTRODUODENOSCOPY (EGD) WITH PROPOFOL N/A 11/07/2016   Procedure: ESOPHAGOGASTRODUODENOSCOPY (EGD) WITH PROPOFOL;  Surgeon: GGatha Mayer MD;  [facility];  Service: Endoscopy;  Laterality: N/A;  . EXCISION OF SKIN TAG Right 11/05/2014   Procedure: EXCISION OF RIGHT EYELID SKIN TAG;  Surgeon: DCoralie Keens  MD;  Location: [place];  Service: General;  Laterality: Right;  . EYE SURGERY Bilateral    cataract   . GASTRIC BYPASS  1977    reversed in 1979, [place] N/A 06/29/2014   Procedure: LEFT HEART CATHETERIZATION WITH CORONARY ANGIOGRAM;  Surgeon: Troy Sine, MD;  Location: [facility] CATH LAB;  Service: Cardiovascular;  Laterality: N/A;  . MI with stent placement  2004    Patient Care Team: Gildardo Cranker, DO as PCP - General (Internal Medicine) [name], [name], MD (Inactive) as Consulting Physician (Cardiology) [name], MD as Consulting Physician (Ophthalmology) [name], MD as Consulting Physician (General Surgery) [name], MD as Referring Physician (Obstetrics and Gynecology)  Social  History   Social History  . Marital status: Widowed    Spouse name: N/A  . Number of children: N/A  . Years of education: N/A   Occupational History  . retired    Social History Main Topics  . Smoking status: Never Smoker  . Smokeless tobacco: Never Used  . Alcohol use No  . Drug use: No  . Sexual activity: Not Currently   Other Topics Concern  . Not on file   Social History Narrative  . No narrative on file     reports that she has never smoked. She has never used smokeless tobacco. She reports that she does not drink alcohol or use drugs.  Family History  Problem Relation Age of Onset  . Breast cancer Mother 68  . Heart disease Mother   . Throat cancer Father   . Hypertension Father   . Arthritis Father   . Diabetes Father   . Arthritis Sister   . Obesity Sister   . Diabetes Sister   . Heart disease Cousin   . Colon cancer Neg Hx   . Stomach cancer Neg Hx   . Esophageal cancer Neg Hx    Family Status  Relation Status  . Mother Deceased at age 97       Cause of Death: Breast cancer  . Father Deceased at age 21       Cause of Death: Complications of diabetes & HTN  . Sister Deceased at age 55  . Sister Alive  . Daughter Alive  . Son Alive  . Sister Alive  . Sister Alive  . Brother Alive  . Daughter Alive  . MGM Deceased  . MGF Deceased  . PGM Deceased  . PGF Deceased  . Cousin Deceased  . Neg Hx (Not Specified)     Allergies  Allergen Reactions  . Sulfonamide Derivatives Swelling    Mouth swelling  . Tramadol Nausea And Vomiting  . Aricept [Donepezil Hcl]     GI upset/loose stools    Medications: Patient's Medications  New Prescriptions   No medications on file  Previous Medications   ALBUTEROL (PROVENTIL HFA;VENTOLIN HFA) 108 (90 BASE) MCG/ACT INHALER    Inhale 1-2 puffs into the lungs every 6 (six) hours as needed for wheezing or shortness of breath.   ANASTROZOLE (ARIMIDEX) 1 MG TABLET    Take 1 tablet (1 mg total) by mouth daily.    BUDESONIDE-FORMOTEROL (SYMBICORT) 160-4.5 MCG/ACT INHALER    Inhale 2 puffs into the lungs 2 (two) times daily. For bronchitis   CETIRIZINE (ZYRTEC) 10 MG TABLET    Take 10 mg by mouth at bedtime.   DOCUSATE SODIUM (COLACE) 100 MG CAPSULE    Take 100 mg by mouth  daily as needed for mild constipation. TAKE PER BOTTLE AS NEEDED FOR CONSTIPATION    DULOXETINE (CYMBALTA) 60 MG CAPSULE    TAKE 1 CAPSULE BY MOUTH EVERY DAY FOR anxiety   ESTRADIOL (ESTRACE) 0.1 MG/GM VAGINAL CREAM    Use every other evening use in vaginal area   HYDROXYZINE (ATARAX/VISTARIL) 10 MG TABLET    Take 1 tablet (10 mg total) by mouth 3 (three) times daily as needed for itching.   INSULIN ASPART (NOVOLOG FLEXPEN) 100 UNIT/ML FLEXPEN    Inject 34 Units into the skin 2 (two) times daily.   INSULIN GLARGINE (TOUJEO SOLOSTAR) 300 UNIT/ML SOPN    Inject 70 Units into the skin 2 (two) times daily. (before breakfast and before supper)   ISOSORBIDE MONONITRATE (IMDUR) 60 MG 24 HR TABLET    Take 1 tablet (60 mg total) by mouth daily.   LANCETS (ONETOUCH ULTRASOFT) LANCETS    Check blood sugar 2-3 times daily as directed   LOSARTAN (COZAAR) 25 MG TABLET    TAKE 1 TABLET BY MOUTH ONCE daily   MECLIZINE (ANTIVERT) 25 MG TABLET    Take 1 tablet (25 mg total) by mouth 3 (three) times daily as needed for dizziness.   MEMANTINE (NAMENDA) 5 MG TABLET    Take 1 tablet (5 mg total) by mouth 2 (two) times daily.   METOPROLOL SUCCINATE (TOPROL-XL) 25 MG 24 HR TABLET    Take one tablet by mouth once daily for blood pressure   NITROGLYCERIN (NITROSTAT) 0.4 MG SL TABLET    Take one tablet under the tongue every 5 minutes as needed for chest pain   ONETOUCH VERIO TEST STRIP    Use to test blood sugar three times daily. Dx: E11.00   OXYCODONE-ACETAMINOPHEN (PERCOCET) 10-325 MG TABLET    Take 1 tablet by mouth every 6 (six) hours.   PANTOPRAZOLE (PROTONIX) 40 MG TABLET    Take 1 tablet (40 mg total) by mouth daily.   PRAVASTATIN (PRAVACHOL) 20 MG TABLET     Take one tablet by mouth once daily for cholesterol   SODIUM CHLORIDE (OCEAN) 0.65 % SOLN NASAL SPRAY    Place 1 spray into both nostrils 3 (three) times daily as needed (for sinus congestion.). RINSE AS NEEDED FOR SINUS CONGESTION    SPACER/AERO-HOLDING CHAMBERS (AEROCHAMBER PLUS FLO-VU LARGE) MISC    1 each by Other route once.   ULORIC 40 MG TABLET    TAKE ONE TABLET BY MOUTH ONCE DAILY   WARFARIN (COUMADIN) 5 MG TABLET    Take 1 tablet daily except 1 & 1/2 tablets on Tuesdays and Thursdays  Modified Medications   No medications on file  Discontinued Medications   No medications on file    Review of Systems  Unable to perform ROS: Other (memory loss)    Vitals:   02/27/17 1303  BP: 132/84  Pulse: 81  Temp: 98.2 F (36.8 C)  TempSrc: Oral  SpO2: 95%  Weight: (!) 343 lb (155.6 kg)   Body mass index is 53.72 kg/m.  Physical Exam  Constitutional: She appears well-developed and well-nourished.  Musculoskeletal: She exhibits edema.  Neurological: She is alert.  Skin: Skin is warm and dry. No rash noted.  Psychiatric: She has a normal mood and affect. Her behavior is normal. Thought content normal.     Labs reviewed: Office Visit on 02/26/2017  Component Date Value Ref Range Status  . INR 02/26/2017 2.5   Final  Office Visit on 01/16/2017  Component Date  Value Ref Range Status  . Hgb A1c MFr Bld 01/16/2017 9.6* <5.7 % of total Hgb Final   Comment: For someone without known diabetes, a hemoglobin A1c value of 6.5% or greater indicates that they may have  diabetes and this should be confirmed with a follow-up  test. . For someone with known diabetes, a value <7% indicates  that their diabetes is well controlled and a value  greater than or equal to 7% indicates suboptimal  control. A1c targets should be individualized based on  duration of diabetes, age, comorbid conditions, and  other considerations. . Currently, no consensus exists regarding use of hemoglobin A1c  for diagnosis of diabetes for children. .   . Mean Plasma Glucose 01/16/2017 229  (calc) Final  . eAG (mmol/L) 01/16/2017 12.7  (calc) Final  . Cholesterol 01/16/2017 114  <200 mg/dL Final  . HDL 01/16/2017 38* >50 mg/dL Final  . Triglycerides 01/16/2017 149  <150 mg/dL Final  . LDL Cholesterol (Calc) 01/16/2017 53  mg/dL (calc) Final   Comment: Reference range: <100 . Desirable range <100 mg/dL for primary prevention;   <70 mg/dL for patients with CHD or diabetic patients  with > or = 2 CHD risk factors. [name] LDL-C is now calculated using the Martin-Hopkins  calculation, which is a validated novel method providing  better accuracy than the Friedewald equation in the  estimation of LDL-C.  Cresenciano Genre et al. Annamaria Helling. 4098;119(14): 2061-2068  (http://education.[employer].com/faq/FAQ164)   . Total CHOL/HDL Ratio 01/16/2017 3.0  <5.0 (calc) Final  . Non-HDL Cholesterol (Calc) 01/16/2017 76  <130 mg/dL (calc) Final   Comment: For patients with diabetes plus 1 major ASCVD risk  factor, treating to a non-HDL-C goal of <100 mg/dL  (LDL-C of <70 mg/dL) is considered a therapeutic  option.   . Glucose, Bld 01/16/2017 44* 65 - 139 mg/dL Final   Comment: .        Non-fasting reference interval .   . BUN 01/16/2017 27* 7 - 25 mg/dL Final  . Creat 01/16/2017 1.52* 0.60 - 0.88 mg/dL Final   Comment: For patients >57 years of age, the reference limit for Creatinine is approximately 13% higher for people identified as African-American. .   . GFR, Est Non African American 01/16/2017 32* > OR = 60 mL/min/1.35m Final  . GFR, Est African American 01/16/2017 37* > OR = 60 mL/min/1.730mFinal  . BUN/Creatinine Ratio 01/16/2017 18  6 - 22 (calc) Final  . Sodium 01/16/2017 137  135 - 146 mmol/L Final  . Potassium 01/16/2017 4.4  3.5 - 5.3 mmol/L Final  . Chloride 01/16/2017 105  98 - 110 mmol/L Final  . CO2 01/16/2017 25  20 - 32 mmol/L Final  . Calcium 01/16/2017 8.7  8.6 - 10.4 mg/dL Final    . Total Protein 01/16/2017 7.4  6.1 - 8.1 g/dL Final  . Albumin 01/16/2017 3.2* 3.6 - 5.1 g/dL Final  . Globulin 01/16/2017 4.2* 1.9 - 3.7 g/dL (calc) Final  . AG Ratio 01/16/2017 0.8* 1.0 - 2.5 (calc) Final  . Total Bilirubin 01/16/2017 0.3  0.2 - 1.2 mg/dL Final  . Alkaline phosphatase (APISO) 01/16/2017 90  33 - 130 U/L Final  . AST 01/16/2017 15  10 - 35 U/L Final  . ALT 01/16/2017 9  6 - 29 U/L Final  Office Visit on 01/01/2017  Component Date Value Ref Range Status  . INR 01/01/2017 1.1   Final    No results found.   Assessment/Plan   ICD-10-CM  1. Memory loss R41.3   2. Uncontrolled type 2 diabetes mellitus with diabetic nephropathy, with long-term current use of insulin (HCC) E11.21 BMP with eGFR   E11.65 ALT   Z79.4 Lipid Panel    Hemoglobin A1c    Microalbumin/Creatinine Ratio, Urine  3. Need for pneumococcal vaccination Z23 Pneumococcal conjugate vaccine 13-valent   Reduce complex carbs in diet (including corn)  Please eat 3 meals per day with healthy snacks in between  Continue other medications as ordered  Prevnar vaccine given today  Follow up in 3 mos for DM, HTN, hyperlipidemia, memory loss. Fasting labs prior to appt   [place] S. Perlie Gold  [facility] [address] [place], [place] [address] [phone] Cell (Monday-Friday 8 AM - 5 PM) [phone] After 5 PM and follow prompts

## 2017-02-27 NOTE — Patient Instructions (Addendum)
Reduce complex carbs in diet (including corn)  Please eat 3 meals per day with healthy snacks in between  Continue other medications as ordered  Prevnar vaccine given today  Follow up in 3 mos for DM, HTN, hyperlipidemia, memory loss. Fasting labs prior to appt

## 2017-03-12 ENCOUNTER — Ambulatory Visit
Admission: RE | Admit: 2017-03-12 | Discharge: 2017-03-12 | Disposition: A | Discharge status: Home / Self Care | Payer: PPO | Source: Ambulatory Visit | Attending: Hematology | Admitting: Hematology

## 2017-03-12 DIAGNOSIS — M8580 Other specified disorders of bone density and structure, unspecified site: Secondary | ICD-10-CM

## 2017-03-12 DIAGNOSIS — Z780 Asymptomatic menopausal state: Secondary | ICD-10-CM | POA: Diagnosis not present

## 2017-03-12 DIAGNOSIS — C50812 Malignant neoplasm of overlapping sites of left female breast: Secondary | ICD-10-CM

## 2017-03-12 DIAGNOSIS — Z170 Estrogen receptor positive status [ER+]: Principal | ICD-10-CM

## 2017-03-12 DIAGNOSIS — D649 Anemia, unspecified: Secondary | ICD-10-CM

## 2017-03-16 ENCOUNTER — Other Ambulatory Visit: Payer: Self-pay

## 2017-03-16 DIAGNOSIS — G894 Chronic pain syndrome: Secondary | ICD-10-CM

## 2017-03-16 MED ORDER — OXYCODONE-ACETAMINOPHEN 10-325 MG PO TABS: 1.0000 | ORAL_TABLET | Freq: Four times a day (QID) | ORAL | 0 refills | Status: DC

## 2017-03-16 NOTE — Telephone Encounter (Signed)
[place] Database verified and compliance confirmed   

## 2017-03-20 NOTE — Progress Notes (Signed)
.    Hematology oncology Clinic Followup Note  Date of service 03/21/17   Patient Care Team: Gildardo Cranker, DO as PCP - General (Internal Medicine) [name], [name], MD (Inactive) as Consulting Physician (Cardiology) [name], MD as Consulting Physician (Ophthalmology) [name], MD as Consulting Physician (General Surgery) [name], MD as Referring Physician (Obstetrics and Gynecology)  CHIEF COMPLAINTS/PURPOSE OF CONSULTATION:  F/u for breast cancer.  DIAGNOSIS: left-sided presumed stage IA  (pT1c,pNx[cN0], cM0) invasive ductal carcinoma grade 2 out of 3, strongly ER +100%, strongly PR +100% and HER-2/neu negative. Given her high risk cardiac status lumpectomy had to be done under local anesthesia and sentinel lymph node biopsy was not possible. She has accompanying DCIS of intermediate grade present at margins. ECOG performance status is 3. -Had a mammogram/tomosynthesis done on 07/29/2015 which shows residual suspicious calcification in the left breast postero-medial to the biopsy site. Has known residual DCIS.  No evidence of new malignancy.  No evidence of right breast malignancy.   CURRENT TREATMENT: Arimidex 27m po daily  HISTORY OF PRESENTING ILLNESS: -please see my initial consultation for details of intial presentation  INTERVAL HISTORY Mrs. CTurrubiatesis here for her scheduled 6 month followup of her breast cancer. She is accompanied by her two daughters today. She denies any new medical concerns since her last visit to the office. She denies issues with anastrozole and tolerates it with good tolerance overall. She reports that she has had hot flashes daily. She notes that her blood sugars have been better as of lately and she is taking her insulin as prescribed.   Since her last visit to the office, she had a bone density scan on 03/12/2017 with results showing: T-score of 0.5 at left forearm. Lumbar spine and dual femurs not completed due to patient in a wheelchair  and not able to lay flat due to SOB while laying flat.  On review of systems, she reports left shoulder pain that radiates from her left antecubital region to her left sided neck. She reports posterior neck "tenseness" that she has had evaluated in the past. She reports left sided neck pain due to a spasm to her left sided neck for a "good little while". She denies any enlarging lymph nodes. She denies abdominal pain or back pain. She reports that she has chronic constipation due to prescription narcotic use.      MEDICAL HISTORY:  Past Medical History:  Diagnosis Date  . Allergy    takes Mucinex daily as needed  . Anemia, unspecified   . Anxiety    takes Clonazepam daily as needed  . Arthritis   . CHF (congestive heart failure) (HCC)    takes Furosemide daily  . Coronary artery disease    a. s/p IMI 2004 tx with BMS to RCA;  b. s/p Promus DES to RCA 2/12 (cath: LM 20-30%, pLAD 20-30%, mLAD 50%, RI 30%, pRCA 60%, mRCA 90% - tx with PCI);  c. myoview 1/07: EF 49%, inf scar, no isch  . Coronary atherosclerosis of native coronary artery   . Depression    takes Cymbalta daily  . Diabetes mellitus    insulin daily  . Dysuria   . GERD (gastroesophageal reflux disease)    takes Protonix daily  . Gout, unspecified    takes Colchicine daily  . Headache    occasionally  . History of blood clots about [address]yrago   in legs-takes Coumadin   . Hyperlipidemia    takes Pravastatin daily  .  Hypertension    takes Imdur and Metoprolol daily  . Insomnia   . Joint pain   . Joint swelling   . Myocardial infarction ([place]) 2004  . Numbness   . Obstructive sleep apnea   . Osteoarthritis   . Osteoarthritis   . Osteoarthrosis, unspecified whether generalized or localized, lower leg   . Pain, chronic   . Polymyalgia rheumatica ([place])   . Pulmonary emboli ([place]) 9/13   felt to need lifelong anticoagulation  . Shortness of breath dyspnea    with exertion and has Albuterol inhaler prn  . Type II or  unspecified type diabetes mellitus without mention of complication, uncontrolled   . Urinary incontinence    takes Linzess daily  . Vertigo    hx of;was taking Meclizine if needed    SURGICAL HISTORY: Past Surgical History:  Procedure Laterality Date  . ABDOMINAL HYSTERECTOMY     partial  . APPENDECTOMY    . blood clots/legs and lungs  2013  . BREAST BIOPSY Left 07/22/2014  . BREAST BIOPSY Left 02/10/2013  . BREAST LUMPECTOMY Left 11/05/2014  . CARDIAC CATHETERIZATION    . COLONOSCOPY    . CORONARY ANGIOPLASTY  2  . EYE SURGERY Bilateral    cataract   . GASTRIC BYPASS  1977    reversed in 1979, [facility]  . MI with stent placement  2004    SOCIAL HISTORY: Social History   Socioeconomic History  . Marital status: Widowed    Spouse name: Not on file  . Number of children: Not on file  . Years of education: Not on file  . Highest education level: Not on file  Social Needs  . Financial resource strain: Not on file  . Food insecurity - worry: Not on file  . Food insecurity - inability: Not on file  . Transportation needs - medical: Not on file  . Transportation needs - non-medical: Not on file  Occupational History  . Occupation: retired  Tobacco Use  . Smoking status: Never Smoker  . Smokeless tobacco: Never Used  Substance and Sexual Activity  . Alcohol use: No    Alcohol/week: 0.0 oz  . Drug use: No  . Sexual activity: Not Currently  Other Topics Concern  . Not on file  Social History Narrative  . Not on file    FAMILY HISTORY: Family History  Problem Relation Age of Onset  . Breast cancer Mother 62  . Heart disease Mother   . Throat cancer Father   . Hypertension Father   . Arthritis Father   . Diabetes Father   . Arthritis Sister   . Obesity Sister   . Diabetes Sister   . Heart disease Cousin   . Colon cancer Neg Hx   . Stomach cancer Neg Hx   . Esophageal cancer Neg Hx     ALLERGIES:  is allergic to sulfonamide derivatives; tramadol;  and aricept [donepezil hcl].  MEDICATIONS:  Current Outpatient Medications  Medication Sig Dispense Refill  . albuterol (PROVENTIL HFA;VENTOLIN HFA) 108 (90 Base) MCG/ACT inhaler Inhale 1-2 puffs into the lungs every 6 (six) hours as needed for wheezing or shortness of breath. 1 Inhaler 6  . anastrozole (ARIMIDEX) 1 MG tablet Take 1 tablet (1 mg total) by mouth daily. 90 tablet 4  . budesonide-formoterol (SYMBICORT) 160-4.5 MCG/ACT inhaler Inhale 2 puffs into the lungs 2 (two) times daily. For bronchitis 1 Inhaler 6  . cetirizine (ZYRTEC) 10 MG tablet Take 10 mg by mouth at  bedtime.    . docusate sodium (COLACE) 100 MG capsule Take 100 mg by mouth daily as needed for mild constipation. TAKE PER BOTTLE AS NEEDED FOR CONSTIPATION     . DULoxetine (CYMBALTA) 60 MG capsule TAKE 1 CAPSULE BY MOUTH EVERY DAY FOR anxiety 90 capsule 0  . estradiol (ESTRACE) 0.1 MG/GM vaginal cream Use every other evening use in vaginal area    . hydrOXYzine (ATARAX/VISTARIL) 10 MG tablet Take 1 tablet (10 mg total) by mouth 3 (three) times daily as needed for itching. 60 tablet 1  . insulin aspart (NOVOLOG FLEXPEN) 100 UNIT/ML FlexPen Inject 34 Units into the skin 2 (two) times daily. 15 mL 6  . Insulin Glargine (TOUJEO SOLOSTAR) 300 UNIT/ML SOPN Inject 70 Units into the skin 2 (two) times daily. (before breakfast and before supper) 12 pen 6  . isosorbide mononitrate (IMDUR) 60 MG 24 hr tablet Take 1 tablet (60 mg total) by mouth daily. 90 tablet 1  . Lancets (ONETOUCH ULTRASOFT) lancets Check blood sugar 2-3 times daily as directed    . losartan (COZAAR) 25 MG tablet TAKE 1 TABLET BY MOUTH ONCE daily 30 tablet 3  . meclizine (ANTIVERT) 25 MG tablet Take 1 tablet (25 mg total) by mouth 3 (three) times daily as needed for dizziness. 60 tablet 0  . memantine (NAMENDA) 5 MG tablet Take 1 tablet (5 mg total) by mouth 2 (two) times daily. 60 tablet 6  . metoprolol succinate (TOPROL-XL) 25 MG 24 hr tablet Take one tablet by  mouth once daily for blood pressure 90 tablet 1  . nitroGLYCERIN (NITROSTAT) 0.4 MG SL tablet Take one tablet under the tongue every 5 minutes as needed for chest pain 50 tablet 0  . ONETOUCH VERIO test strip Use to test blood sugar three times daily. Dx: E11.00 100 each 12  . oxyCODONE-acetaminophen (PERCOCET) 10-325 MG tablet Take 1 tablet every 6 (six) hours by mouth. 120 tablet 0  . pantoprazole (PROTONIX) 40 MG tablet Take 1 tablet (40 mg total) by mouth daily. 90 tablet 3  . pravastatin (PRAVACHOL) 20 MG tablet Take one tablet by mouth once daily for cholesterol 90 tablet 1  . sodium chloride (OCEAN) 0.65 % SOLN nasal spray Place 1 spray into both nostrils 3 (three) times daily as needed (for sinus congestion.). RINSE AS NEEDED FOR SINUS CONGESTION     . Spacer/Aero-Holding Chambers (AEROCHAMBER PLUS FLO-VU LARGE) MISC 1 each by Other route once. 1 each 0  . ULORIC 40 MG tablet TAKE ONE TABLET BY MOUTH ONCE DAILY 90 tablet 1  . warfarin (COUMADIN) 5 MG tablet Take 1 tablet daily except 1 & 1/2 tablets on Tuesdays and Thursdays 45 tablet 3   No current facility-administered medications for this visit.     REVIEW OF SYSTEMS:   Review of systems as noted above Remaining 10 point review of systems negative except as noted above.  PHYSICAL EXAMINATION:  ECOG PERFORMANCE STATUS: 3 - Symptomatic, >50% confined to bed  Vitals:   03/21/17 1223  BP: (!) 153/66  Pulse: 89  Resp: 18  Temp: 97.8 F (36.6 C)  SpO2: 100%   Filed Weights   03/21/17 1223  Weight: (!) 341 lb 9.6 oz (154.9 kg)   GENERAL: Pleasant African-American lady, alert, no distress and comfortable, obese. SKIN: skin color, texture, turgor are normal, no rashes or significant lesions EYES: normal, conjunctiva are pink and non-injected, sclera clear OROPHARYNX: no exudate, no erythema and lips, buccal mucosa, and tongue normal  NECK: supple, thyroid normal size, non-tender, without nodularity. Muscle spasm of the left  sternocleidomastoid.  LYMPH:  no palpable lymphadenopathy in the cervical, axillary or inguinal LUNGS: clear to auscultation normal breathing effort.  HEART: regular rate & rhythm and no murmurs and bilateral 1+ pedal edema ABDOMEN:abdomen obese, soft, non-tender and normal bowel sounds Musculoskeletal:no cyanosis of digits and no clubbing  PSYCH: alert & oriented x 3 with fluent speech NEURO: no focal motor/sensory deficits Breast: Scribe chaperone present for exam. No breast lumps or obvious palpable regional lymph nodes.  LABORATORY DATA:  I have reviewed the data as listed  . CBC Latest Ref Rng & Units 03/21/2017 09/20/2016 08/11/2016  WBC 3.9 - 10.3 10e3/uL 14.2(H) 10.7(H) 13.5(H)  Hemoglobin 11.6 - 15.9 g/dL 8.7(L) 8.9(L) 9.3(L)  Hematocrit 34.8 - 46.6 % 28.9(L) 29.1(L) 29.7(L)  Platelets 145 - 400 10e3/uL 295 282 356   . CBC    Component Value Date/Time   WBC 14.2 (H) 03/21/2017 1351   WBC 13.5 (H) 08/11/2016 1130   RBC 3.43 (L) 03/21/2017 1351   RBC 3.52 (L) 08/11/2016 1130   HGB 8.7 (L) 03/21/2017 1351   HCT 28.9 (L) 03/21/2017 1351   PLT 295 03/21/2017 1351   MCV 84.3 03/21/2017 1351   MCH 25.4 03/21/2017 1351   MCH 26.4 (L) 08/11/2016 1130   MCHC 30.1 (L) 03/21/2017 1351   MCHC 31.3 (L) 08/11/2016 1130   RDW 15.5 (H) 03/21/2017 1351   LYMPHSABS 2.4 03/21/2017 1351   MONOABS 0.7 03/21/2017 1351   EOSABS 0.2 03/21/2017 1351   BASOSABS 0.0 03/21/2017 1351    . CMP Latest Ref Rng & Units 03/21/2017 01/16/2017 10/13/2016  Glucose 70 - 140 mg/dl 208(H) 44(L) 408(H)  BUN 7.0 - 26.0 mg/dL 28.5(H) 27(H) 20  Creatinine 0.6 - 1.1 mg/dL 1.4(H) 1.52(H) 1.25(H)  Sodium 136 - 145 mEq/L 139 137 131(L)  Potassium 3.5 - 5.1 mEq/L 4.7 4.4 4.7  Chloride 98 - 110 mmol/L - 105 97(L)  CO2 22 - 29 mEq/L _0 Calcium 8.4 - 10.4 mg/dL 9.1 8.7 8.7  Total Protein 6.4 - 8.3 g/dL 7.9 7.4 -  Total Bilirubin 0.20 - 1.20 mg/dL 0.30 0.3 -  Alkaline Phos 40 - 150 U/L 77 - -  AST 5 - 34  U/L 12 15 -  ALT 0 - 55 U/L 10 9 -      RADIOGRAPHIC STUDIES: I have personally reviewed the radiological images as listed and agreed with the findings in the report.  Dg Bone Density  Result Date: 03/12/2017 EXAM: DUAL X-RAY ABSORPTIOMETRY (DXA) FOR BONE MINERAL DENSITY IMPRESSION: Referring Physician:  [name] PATIENT: Name: AMIKA, TASSIN Patient ID: [id number] Birth Date: Jul 06, 1934 Height: 67.0 in. Sex: Female Measured: 03/12/2017 Weight: 343.0 lbs. Indications: Advanced Age, Anastrazole, Anxiety, Breast Cancer History, Estrogen Deficient, Hysterectomy, Insulin for Diabetes, Left Ovariectomy, Postmenopausal, Protonix, Secondary Osteoporosis Fractures: None Treatments: Calcium (E943.0), Hormone Therapy For Cancer, Vitamin D (E933.5) ASSESSMENT: The BMD measured at Forearm Radius 33% is 0.928 g/cm2 with a T-score of 0.5. This patient is considered normal according to [facility]) criteria. Lumbar Spine and Dual Femurs not used because patient in wheelchair and states that laying flat isn't something she can do because she gets short of breath and can't breath. Site Region Measured Date Measured Age YA BMD Significant CHANGE T-score Left Forearm Radius 33% 5/12/2017 80.4 0.5 0.928 g/cm2 World Health Organization [facility]) criteria for post-menopausal, Caucasian Women: Normal  T-score at or above -1 SD Osteopenia   T-score between -1 and -2.5 SD Osteoporosis T-score at or below -2.5 SD RECOMMENDATION: [name] recommends that FDA-approved medical therapies be considered in postmenopausal women and men age 80 or older with a: 1. Hip or vertebral (clinical or morphometric) fracture. 2. T-score of <-2.5 at the spine or hip. 3. Ten-year fracture probability by FRAX of 3% or greater for hip fracture or 20% or greater for major osteoporotic fracture. All treatment decisions require clinical judgment and consideration of individual patient factors,  including patient preferences, co-morbidities, previous drug use, risk factors not captured in the FRAX model (e.g. falls, vitamin D deficiency, increased bone turnover, interval significant decline in bone density) and possible under - or over-estimation of fracture risk by FRAX. All patients should ensure an adequate intake of dietary calcium (1200 mg/d) and vitamin D (800 IU daily) unless contraindicated. FOLLOW-UP: People with diagnosed cases of osteoporosis or at high risk for fracture should have regular bone mineral density tests. For patients eligible for Medicare, routine testing is allowed once every 2 years. The testing frequency can be increased to one year for patients who have rapidly progressing disease, those who are receiving or discontinuing medical therapy to restore bone mass, or have additional risk factors. I have reviewed this report, and agree with the above findings. [facility] Radiology Electronically Signed   By: Marin Olp M.D.   On: 03/12/2017 15:12     CLINICAL DATA:  Annual mammography. The patient had surgery for DCIS in the left breast in 2016. There were positive margins at surgery but the patient could not return for additional surgery. Known suspicious calcifications remain.  EXAM: 2D DIGITAL DIAGNOSTIC BILATERAL MAMMOGRAM WITH CAD AND ADJUNCT TOMO  COMPARISON:  Previous exam(s).  ACR Breast Density Category b: There are scattered areas of fibroglandular density.  FINDINGS: The suspicious calcifications posterior to the left lumpectomy site are stable. No other interval changes or other suspicious findings.  Mammographic images were processed with CAD.  IMPRESSION: Continued suspicious calcifications posterior to the left lumpectomy site. No other changes.  RECOMMENDATION: Continued annual mammography for surveillance. Continued oncologic follow up.  I have discussed the findings and recommendations with the patient. Results were also  provided in writing at the conclusion of the visit. If applicable, a reminder letter will be sent to the patient regarding the next appointment.  BI-RADS CATEGORY  6: Known biopsy-proven malignancy.   Electronically Signed   By: Dorise Bullion III M.D   On: 08/16/2016 15:21   ASSESSMENT & PLAN:   Mrs. Rosetti is a very wonderful 82 year old African-American female with multiple medical comorbidities as described above with   #1Left-sided presumed stage IA  (pT1c,pNx[cN0], cM0) invasive ductal carcinoma grade 2 out of 3, strongly ER +100%, strongly PR +100% and HER-2/neu negative. Given her high risk cardiac status lumpectomy had to be done under local anesthesia and sentinel lymph node biopsy was not possible. She has accompanying DCIS of intermediate grade present at margins. ECOG performance status is 3. Dexa scan "low bone mass" per WHO. -Bone density scan on 03/12/2017 with results showing: T-score of 0.5 at left forearm. Lumbar spine and dual femurs not completed due to patient in a wheelchair and not able to lay flat due to SOB while laying flat.   #2 significant coronary artery disease with stress test in February 2016 suggesting likely multivessel disease. Has uncontrolled diabetes, hypertension, dyslipidemia, untreated sleep apnea, coronary disease, severe arthritis all of which  are significantly limiting her quality of life. #3 vitamin D deficiency - 25OH vitamin D level of 10, s/p high dose ergocalciferol re-placement with 25OH Vit D improvement to 30.5.  Now on vit D 2000 units daily.   Plan -no clear clinical indication for breast cancer progression at this time. Mammogram done on 08/16/2016 appear stable. -No prohibitive toxicities from the Arimidex at this time. -continue Arimidex 84m po daily - refill sent. -continue vit D 2000 IU daily  -at least 1200-15032mpo calcium intake daily -rpt MMG 08/2017  #4 Worsening Anemia ? Slow GI losses . Multiple metabolic insults in  the bone marrow including uncontrolled diabetes .patient notes that her fasting glucose and so and in the 300s . Plan  -She was recommended to follow-up with her primary care physician to optimize her diabetes management .  #5 Hyperkalemia - potassium 5.9 . Likely related to metabolic derangements from her diabetes . Cannot rule out the possibility of type IV RTA related to diabetes . Also has some element of chronic kidney disease and is on ARB . Plan  -Optimize diabetes management with adequate insulin therapy . That a control of her hyperglycemia will probably drive down the potassium with adequate insulin use . -Maintain good oral hydration with at least 48-64 ounces of water daily .   Labs today MMG in 08/2017 RTC with Dr KaIrene Limbon 40m29monthith labs and MMG  Continue follow-up with your primary care physician Dr. CarEulas Postr other ongoing cares.  GauSullivan Lone MS Hematology/Oncology Physician [facility] Center[facility]):       [phone] cell):  [phone]):           [phone] document serves as a record of services personally performed by GauSullivan LoneD. It was created on his behalf by SoiSteva Colder trained medical scribe. The creation of this record is based on the scribe's personal observations and the provider's statements to them.   .I have reviewed the above documentation for accuracy and completeness, and I agree with the above. .GaBrunetta Genera

## 2017-03-21 ENCOUNTER — Telehealth: Payer: Self-pay

## 2017-03-21 ENCOUNTER — Ambulatory Visit (HOSPITAL_BASED_OUTPATIENT_CLINIC_OR_DEPARTMENT_OTHER): Payer: PPO

## 2017-03-21 ENCOUNTER — Encounter: Payer: Self-pay | Admitting: Hematology

## 2017-03-21 ENCOUNTER — Ambulatory Visit (HOSPITAL_BASED_OUTPATIENT_CLINIC_OR_DEPARTMENT_OTHER): Payer: PPO | Admitting: Hematology

## 2017-03-21 VITALS — BP 153/66 | HR 89 | Temp 97.8°F | Resp 18 | Ht 67.0 in | Wt 341.6 lb

## 2017-03-21 DIAGNOSIS — C50912 Malignant neoplasm of unspecified site of left female breast: Secondary | ICD-10-CM

## 2017-03-21 DIAGNOSIS — E119 Type 2 diabetes mellitus without complications: Secondary | ICD-10-CM

## 2017-03-21 DIAGNOSIS — E875 Hyperkalemia: Secondary | ICD-10-CM

## 2017-03-21 DIAGNOSIS — Z170 Estrogen receptor positive status [ER+]: Secondary | ICD-10-CM | POA: Diagnosis not present

## 2017-03-21 DIAGNOSIS — C50812 Malignant neoplasm of overlapping sites of left female breast: Secondary | ICD-10-CM

## 2017-03-21 DIAGNOSIS — E559 Vitamin D deficiency, unspecified: Secondary | ICD-10-CM

## 2017-03-21 DIAGNOSIS — D649 Anemia, unspecified: Secondary | ICD-10-CM

## 2017-03-21 DIAGNOSIS — Z79811 Long term (current) use of aromatase inhibitors: Secondary | ICD-10-CM

## 2017-03-21 DIAGNOSIS — M8580 Other specified disorders of bone density and structure, unspecified site: Secondary | ICD-10-CM

## 2017-03-21 DIAGNOSIS — N189 Chronic kidney disease, unspecified: Secondary | ICD-10-CM

## 2017-03-21 LAB — CBC & DIFF AND RETIC
BASO%: 0.1 % (ref 0.0–2.0)
Basophils Absolute: 0 10*3/uL (ref 0.0–0.1)
EOS%: 1.6 % (ref 0.0–7.0)
Eosinophils Absolute: 0.2 10*3/uL (ref 0.0–0.5)
HCT: 28.9 % — ABNORMAL LOW (ref 34.8–46.6)
HGB: 8.7 g/dL — ABNORMAL LOW (ref 11.6–15.9)
Immature Retic Fract: 11.2 % — ABNORMAL HIGH (ref 1.60–10.00)
LYMPH%: 17.1 % (ref 14.0–49.7)
MCH: 25.4 pg (ref 25.1–34.0)
MCHC: 30.1 g/dL — ABNORMAL LOW (ref 31.5–36.0)
MCV: 84.3 fL (ref 79.5–101.0)
MONO#: 0.7 10*3/uL (ref 0.1–0.9)
MONO%: 4.9 % (ref 0.0–14.0)
NEUT#: 10.8 10*3/uL — ABNORMAL HIGH (ref 1.5–6.5)
NEUT%: 76.3 % (ref 38.4–76.8)
Platelets: 295 10*3/uL (ref 145–400)
RBC: 3.43 10*6/uL — ABNORMAL LOW (ref 3.70–5.45)
RDW: 15.5 % — ABNORMAL HIGH (ref 11.2–14.5)
Retic %: 1.45 % (ref 0.70–2.10)
Retic Ct Abs: 49.74 10*3/uL (ref 33.70–90.70)
WBC: 14.2 10*3/uL — ABNORMAL HIGH (ref 3.9–10.3)
lymph#: 2.4 10*3/uL (ref 0.9–3.3)

## 2017-03-21 LAB — COMPREHENSIVE METABOLIC PANEL
ALT: 10 U/L (ref 0–55)
AST: 12 U/L (ref 5–34)
Albumin: 2.8 g/dL — ABNORMAL LOW (ref 3.5–5.0)
Alkaline Phosphatase: 77 U/L (ref 40–150)
Anion Gap: 8 mEq/L (ref 3–11)
BUN: 28.5 mg/dL — ABNORMAL HIGH (ref 7.0–26.0)
CO2: 24 mEq/L (ref 22–29)
Calcium: 9.1 mg/dL (ref 8.4–10.4)
Chloride: 108 mEq/L (ref 98–109)
Creatinine: 1.4 mg/dL — ABNORMAL HIGH (ref 0.6–1.1)
EGFR: 41 mL/min/{1.73_m2} — ABNORMAL LOW (ref >60)
Glucose: 208 mg/dl — ABNORMAL HIGH (ref 70–140)
Potassium: 4.7 mEq/L (ref 3.5–5.1)
Sodium: 139 mEq/L (ref 136–145)
Total Bilirubin: 0.3 mg/dL (ref 0.20–1.20)
Total Protein: 7.9 g/dL (ref 6.4–8.3)

## 2017-03-21 LAB — FERRITIN: Ferritin: 50 ng/ml (ref 9–269)

## 2017-03-21 NOTE — Patient Instructions (Signed)
Thank you for choosing [facility] to provide your oncology and hematology care.  To afford each patient quality time with our providers, please arrive 30 minutes before your scheduled appointment time.  If you arrive late for your appointment, you may be asked to reschedule.  We strive to give you quality time with our providers, and arriving late affects you and other patients whose appointments are after yours.   If you are a no show for multiple scheduled visits, you may be dismissed from the clinic at the providers discretion.    Again, thank you for choosing [facility], our hope is that these requests will decrease the amount of time that you wait before being seen by our physicians.  ______________________________________________________________________  Should you have questions after your visit to the [facility], please contact our office at [phone] between the hours of 8:30 and 4:30 p.m.    Voicemails left after 4:30p.m will not be returned until the following business day.    For prescription refill requests, please have your pharmacy contact us directly.  Please also try to allow 48 hours for prescription requests.    Please contact the scheduling department for questions regarding scheduling.  For scheduling of procedures such as PET scans, CT scans, MRI, Ultrasound, etc please contact central scheduling at [phone].    Resources For Cancer Patients and Caregivers:   Oncolink.org:  A wonderful resource for patients and healthcare providers for information regarding your disease, ways to tract your treatment, what to expect, etc.     American Cancer Society:  800-227-2345  Can help patients locate various types of support and financial assistance  Cancer Care: 1-800-813-HOPE (4673) Provides financial assistance, online support groups, medication/co-pay assistance.    [place] DSS:  336-641-3447 Where to apply for food  stamps, Medicaid, and utility assistance  Medicare Rights Center: 800-333-4114 Helps people with Medicare understand their rights and benefits, navigate the Medicare system, and secure the quality healthcare they deserve  SCAT: 336-333-6589 [place] [employer]'s shared-ride transportation service for eligible riders who have a disability that prevents them from riding the fixed route bus.    For additional information on assistance programs please contact our social worker:   Grier Hock/Abigail Elmore:  [phone]            

## 2017-03-21 NOTE — Telephone Encounter (Signed)
Scheduled upcoming appointment and printed avs for patient. Will. Per 11/14 los

## 2017-03-22 ENCOUNTER — Telehealth: Payer: Self-pay

## 2017-03-22 LAB — VITAMIN B12: Vitamin B12: 570 pg/mL (ref 232–1245)

## 2017-03-22 LAB — VITAMIN D 25 HYDROXY (VIT D DEFICIENCY, FRACTURES): Vitamin D, 25-Hydroxy: 28.9 ng/mL — ABNORMAL LOW (ref 30.0–100.0)

## 2017-03-22 NOTE — Telephone Encounter (Signed)
Spoke with patient to inform her that we have 6 boxes of Toujeo that was received from [employer],  ready for pick-up   Patient will have her sister pick-up

## 2017-03-26 ENCOUNTER — Other Ambulatory Visit: Payer: Self-pay | Admitting: Internal Medicine

## 2017-03-27 ENCOUNTER — Ambulatory Visit: Payer: PPO

## 2017-03-28 ENCOUNTER — Other Ambulatory Visit: Payer: Self-pay | Admitting: *Deleted

## 2017-03-28 DIAGNOSIS — Z7901 Long term (current) use of anticoagulants: Secondary | ICD-10-CM

## 2017-03-28 DIAGNOSIS — I2782 Chronic pulmonary embolism: Secondary | ICD-10-CM

## 2017-03-28 MED ORDER — WARFARIN SODIUM 5 MG PO TABS: ORAL_TABLET | ORAL | 3 refills | Status: DC

## 2017-03-28 NOTE — Telephone Encounter (Signed)
[facility] 

## 2017-04-02 ENCOUNTER — Ambulatory Visit (INDEPENDENT_AMBULATORY_CARE_PROVIDER_SITE_OTHER): Payer: PPO

## 2017-04-02 ENCOUNTER — Encounter: Payer: Self-pay | Admitting: Pharmacotherapy

## 2017-04-02 ENCOUNTER — Telehealth: Payer: Self-pay

## 2017-04-02 ENCOUNTER — Ambulatory Visit: Payer: PPO | Admitting: Pharmacotherapy

## 2017-04-02 VITALS — BP 130/78 | HR 81 | Temp 98.0°F | Ht 67.0 in | Wt 347.0 lb

## 2017-04-02 DIAGNOSIS — J449 Chronic obstructive pulmonary disease, unspecified: Secondary | ICD-10-CM

## 2017-04-02 DIAGNOSIS — Z0000 Encounter for general adult medical examination without abnormal findings: Secondary | ICD-10-CM

## 2017-04-02 DIAGNOSIS — E1165 Type 2 diabetes mellitus with hyperglycemia: Secondary | ICD-10-CM

## 2017-04-02 DIAGNOSIS — I5032 Chronic diastolic (congestive) heart failure: Secondary | ICD-10-CM

## 2017-04-02 DIAGNOSIS — IMO0002 Reserved for concepts with insufficient information to code with codable children: Secondary | ICD-10-CM

## 2017-04-02 DIAGNOSIS — Z7901 Long term (current) use of anticoagulants: Secondary | ICD-10-CM | POA: Diagnosis not present

## 2017-04-02 DIAGNOSIS — I2782 Chronic pulmonary embolism: Secondary | ICD-10-CM | POA: Diagnosis not present

## 2017-04-02 DIAGNOSIS — E1121 Type 2 diabetes mellitus with diabetic nephropathy: Secondary | ICD-10-CM

## 2017-04-02 DIAGNOSIS — Z794 Long term (current) use of insulin: Secondary | ICD-10-CM

## 2017-04-02 LAB — POCT INR: INR: 4.1

## 2017-04-02 NOTE — Patient Instructions (Addendum)
[name] , Thank you for taking time to come for your Medicare Wellness Visit. I appreciate your ongoing commitment to your health goals. Please review the following plan we discussed and let me know if I can assist you in the future.   Screening recommendations/referrals: Colonoscopy excluded, you are over age 80 Mammogram excluded, you are over age 56 Bone Density up to date Recommended yearly ophthalmology/optometry visit for glaucoma screening and checkup Recommended yearly dental visit for hygiene and checkup  Vaccinations: Influenza vaccine up to date. Due 2019 fall season Pneumococcal vaccine up to date Tdap vaccine up to date. Due 05/08/2018 Shingles vaccine due, declined  Advanced directives: Please bring [place] a copy of your living will and health care power of attorney  Conditions/risks identified: Home Health needed, itching all over body  Next appointment: Sabra Heck 04/02/2017 @ 12pm    Rich Reining, RN 04/12/2018 @ 1:45pm   Preventive Care 65 Years and Older, Female Preventive care refers to lifestyle choices and visits with your health care provider that can promote health and wellness. What does preventive care include?  A yearly physical exam. This is also called an annual well check.  Dental exams once or twice a year.  Routine eye exams. Ask your health care provider how often you should have your eyes checked.  Personal lifestyle choices, including:  Daily care of your teeth and gums.  Regular physical activity.  Eating a healthy diet.  Avoiding tobacco and drug use.  Limiting alcohol use.  Practicing safe sex.  Taking low-dose aspirin every day.  Taking vitamin and mineral supplements as recommended by your health care provider. What happens during an annual well check? The services and screenings done by your health care provider during your annual well check will depend on your age, overall health, lifestyle risk factors, and family history of  disease. Counseling  Your health care provider may ask you questions about your:  Alcohol use.  Tobacco use.  Drug use.  Emotional well-being.  Home and relationship well-being.  Sexual activity.  Eating habits.  History of falls.  Memory and ability to understand (cognition).  Work and work Statistician.  Reproductive health. Screening  You may have the following tests or measurements:  Height, weight, and BMI.  Blood pressure.  Lipid and cholesterol levels. These may be checked every 5 years, or more frequently if you are over 44 years old.  Skin check.  Lung cancer screening. You may have this screening every year starting at age 76 if you have a 30-pack-year history of smoking and currently smoke or have quit within the past 15 years.  Fecal occult blood test (FOBT) of the stool. You may have this test every year starting at age 47.  Flexible sigmoidoscopy or colonoscopy. You may have a sigmoidoscopy every 5 years or a colonoscopy every 10 years starting at age 29.  Hepatitis C blood test.  Hepatitis B blood test.  Sexually transmitted disease (STD) testing.  Diabetes screening. This is done by checking your blood sugar (glucose) after you have not eaten for a while (fasting). You may have this done every 1-3 years.  Bone density scan. This is done to screen for osteoporosis. You may have this done starting at age 23.  Mammogram. This may be done every 1-2 years. Talk to your health care provider about how often you should have regular mammograms. Talk with your health care provider about your test results, treatment options, and if necessary, the need for more tests.  Vaccines  Your health care provider may recommend certain vaccines, such as:  Influenza vaccine. This is recommended every year.  Tetanus, diphtheria, and acellular pertussis (Tdap, Td) vaccine. You may need a Td booster every 10 years.  Zoster vaccine. You may need this after age  35.  Pneumococcal 13-valent conjugate (PCV13) vaccine. One dose is recommended after age 45.  Pneumococcal polysaccharide (PPSV23) vaccine. One dose is recommended after age 34. Talk to your health care provider about which screenings and vaccines you need and how often you need them. This information is not intended to replace advice given to you by your health care provider. Make sure you discuss any questions you have with your health care provider. Document Released: 05/21/2015 Document Revised: 01/12/2016 Document Reviewed: 02/23/2015 Elsevier Interactive Patient Education  2017 [place] Prevention in the Home Falls can cause injuries. They can happen to people of all ages. There are many things you can do to make your home safe and to help prevent falls. What can I do on the outside of my home?  Regularly fix the edges of walkways and driveways and fix any cracks.  Remove anything that might make you trip as you walk through a door, such as a raised step or threshold.  Trim any bushes or trees on the path to your home.  Use bright outdoor lighting.  Clear any walking paths of anything that might make someone trip, such as rocks or tools.  Regularly check to see if handrails are loose or broken. Make sure that both sides of any steps have handrails.  Any raised decks and porches should have guardrails on the edges.  Have any leaves, snow, or ice cleared regularly.  Use sand or salt on walking paths during winter.  Clean up any spills in your garage right away. This includes oil or grease spills. What can I do in the bathroom?  Use night lights.  Install grab bars by the toilet and in the tub and shower. Do not use towel bars as grab bars.  Use non-skid mats or decals in the tub or shower.  If you need to sit down in the shower, use a plastic, non-slip stool.  Keep the floor dry. Clean up any water that spills on the floor as soon as it happens.  Remove  soap buildup in the tub or shower regularly.  Attach bath mats securely with double-sided non-slip rug tape.  Do not have throw rugs and other things on the floor that can make you trip. What can I do in the bedroom?  Use night lights.  Make sure that you have a light by your bed that is easy to reach.  Do not use any sheets or blankets that are too big for your bed. They should not hang down onto the floor.  Have a firm chair that has side arms. You can use this for support while you get dressed.  Do not have throw rugs and other things on the floor that can make you trip. What can I do in the kitchen?  Clean up any spills right away.  Avoid walking on wet floors.  Keep items that you use a lot in easy-to-reach places.  If you need to reach something above you, use a strong step stool that has a grab bar.  Keep electrical cords out of the way.  Do not use floor polish or wax that makes floors slippery. If you must use wax, use non-skid floor wax.  Do not have throw rugs and other things on the floor that can make you trip. What can I do with my stairs?  Do not leave any items on the stairs.  Make sure that there are handrails on both sides of the stairs and use them. Fix handrails that are broken or loose. Make sure that handrails are as long as the stairways.  Check any carpeting to make sure that it is firmly attached to the stairs. Fix any carpet that is loose or worn.  Avoid having throw rugs at the top or bottom of the stairs. If you do have throw rugs, attach them to the floor with carpet tape.  Make sure that you have a light switch at the top of the stairs and the bottom of the stairs. If you do not have them, ask someone to add them for you. What else can I do to help prevent falls?  Wear shoes that:  Do not have high heels.  Have rubber bottoms.  Are comfortable and fit you well.  Are closed at the toe. Do not wear sandals.  If you use a  stepladder:  Make sure that it is fully opened. Do not climb a closed stepladder.  Make sure that both sides of the stepladder are locked into place.  Ask someone to hold it for you, if possible.  Clearly mark and make sure that you can see:  Any grab bars or handrails.  First and last steps.  Where the edge of each step is.  Use tools that help you move around (mobility aids) if they are needed. These include:  Canes.  Walkers.  Scooters.  Crutches.  Turn on the lights when you go into a dark area. Replace any light bulbs as soon as they burn out.  Set up your furniture so you have a clear path. Avoid moving your furniture around.  If any of your floors are uneven, fix them.  If there are any pets around you, be aware of where they are.  Review your medicines with your doctor. Some medicines can make you feel dizzy. This can increase your chance of falling. Ask your doctor what other things that you can do to help prevent falls. This information is not intended to replace advice given to you by your health care provider. Make sure you discuss any questions you have with your health care provider. Document Released: 02/18/2009 Document Revised: 09/30/2015 Document Reviewed: 05/29/2014 Elsevier Interactive Patient Education  2017 [employer].

## 2017-04-02 NOTE — Progress Notes (Signed)
   Subjective:    Patient ID: [name], female    DOB: [birth year], 82 y.o.   MRN: [id number]  HPI  Last INR on 02/26/17 was OK at 2.5 Current Coumadin dose is 5mg  QD except 7.5mg  T/Th Denies CP, palpitations, falls Consistent with vitamin K intake, but has had a lot of cranberry . Denies missed doses. Denies unusual bleeding or bruising.     Review of Systems  HENT: Negative for nosebleeds.   Respiratory: Negative for shortness of breath.   Cardiovascular: Negative for chest pain.  Gastrointestinal: Negative for anal bleeding and blood in stool.  Genitourinary: Negative for hematuria.  Hematological: Does not bruise/bleed easily.       Objective:   Physical Exam  Constitutional: She is oriented to person, place, and time. She appears well-developed and well-nourished.  HENT:  Right Ear: External ear normal.  Left Ear: External ear normal.  Cardiovascular: Normal rate, regular rhythm and normal heart sounds.  Pulmonary/Chest: Effort normal and breath sounds normal.  Neurological: She is alert and oriented to person, place, and time.  Skin: Skin is warm and dry.  Psychiatric: She has a normal mood and affect. Her behavior is normal. Judgment and thought content normal.  Vitals reviewed.    BP:  130/78 HR: 81 wt: 347lb INR 4.1     Assessment & Plan:  1.  INR above goal 2-3 due to interaction with cranberry and warfarin 2.  Decrease Coumadin 5mg  daily except 7.5mg  Thursday 3. RTC 2 weeks.

## 2017-04-02 NOTE — Progress Notes (Signed)
Subjective:   [name] is a 82 y.o. female who presents for Medicare Annual (Subsequent) preventive examination.  Last AWV-03/24/2016    Objective:     Vitals: BP 130/78 (BP Location: Right Arm, Patient Position: Sitting)   Pulse 81   Temp 98 F (36.7 C) (Oral)   Ht 5\' 7"  (1.702 m)   Wt (!) 347 lb (157.4 kg)   SpO2 96%   BMI 54.35 kg/m   Body mass index is 54.35 kg/m.   Tobacco Social History   Tobacco Use  Smoking Status Never Smoker  Smokeless Tobacco Never Used     Counseling given: Not Answered   Past Medical History:  Diagnosis Date  . Allergy    takes Mucinex daily as needed  . Anemia, unspecified   . Anxiety    takes Clonazepam daily as needed  . Arthritis   . CHF (congestive heart failure) (HCC)    takes Furosemide daily  . Coronary artery disease    a. s/p IMI 2004 tx with BMS to RCA;  b. s/p Promus DES to RCA 2/12 (cath: LM 20-30%, pLAD 20-30%, mLAD 50%, RI 30%, pRCA 60%, mRCA 90% - tx with PCI);  c. myoview 1/07: EF 49%, inf scar, no isch  . Coronary atherosclerosis of native coronary artery   . Depression    takes Cymbalta daily  . Diabetes mellitus    insulin daily  . Dysuria   . GERD (gastroesophageal reflux disease)    takes Protonix daily  . Gout, unspecified    takes Colchicine daily  . Headache    occasionally  . History of blood clots about 5yrs ago   in legs-takes Coumadin   . Hyperlipidemia    takes Pravastatin daily  . Hypertension    takes Imdur and Metoprolol daily  . Insomnia   . Joint pain   . Joint swelling   . Myocardial infarction ([place]) 2004  . Numbness   . Obstructive sleep apnea   . Osteoarthritis   . Osteoarthritis   . Osteoarthrosis, unspecified whether generalized or localized, lower leg   . Pain, chronic   . Polymyalgia rheumatica ([employer])   . Pulmonary emboli ([place]) 9/13   felt to need lifelong anticoagulation  . Shortness of breath dyspnea    with exertion and has Albuterol inhaler prn  . Type II or  unspecified type diabetes mellitus without mention of complication, uncontrolled   . Urinary incontinence    takes Linzess daily  . Vertigo    hx of;was taking Meclizine if needed   Past Surgical History:  Procedure Laterality Date  . ABDOMINAL HYSTERECTOMY     partial  . APPENDECTOMY    . blood clots/legs and lungs  2013  . BREAST BIOPSY Left 07/22/2014  . BREAST BIOPSY Left 02/10/2013  . BREAST LUMPECTOMY Left 11/05/2014  . BREAST LUMPECTOMY WITH RADIOACTIVE SEED LOCALIZATION Left 11/05/2014   Procedure: LEFT BREAST LUMPECTOMY WITH RADIOACTIVE SEED LOCALIZATION;  Surgeon: [name], MD;  Location: [facility];  Service: General;  Laterality: Left;  . CARDIAC CATHETERIZATION    . COLONOSCOPY    . CORONARY ANGIOPLASTY  2  . ESOPHAGOGASTRODUODENOSCOPY (EGD) WITH PROPOFOL N/A 11/07/2016   Procedure: ESOPHAGOGASTRODUODENOSCOPY (EGD) WITH PROPOFOL;  Surgeon: Gatha Mayer, MD;  Location: [facility];  Service: Endoscopy;  Laterality: N/A;  . EXCISION OF SKIN TAG Right 11/05/2014   Procedure: EXCISION OF RIGHT EYELID SKIN TAG;  Surgeon: [name], MD;  Location: [facility];  Service: General;  Laterality: Right;  . EYE SURGERY Bilateral    cataract   . GASTRIC BYPASS  1977    reversed in 1979, [place] N/A 06/29/2014   Procedure: LEFT HEART CATHETERIZATION WITH CORONARY ANGIOGRAM;  Surgeon: Troy Sine, MD;  Location: [facility] CATH [facility];  Service: Cardiovascular;  Laterality: N/A;  . MI with stent placement  2004   Family History  Problem Relation Age of Onset  . Breast cancer Mother 48  . Heart disease Mother   . Throat cancer Father   . Hypertension Father   . Arthritis Father   . Diabetes Father   . Arthritis Sister   . Obesity Sister   . Diabetes Sister   . Heart disease Cousin   . Colon cancer Neg Hx   . Stomach cancer Neg Hx   . Esophageal cancer Neg Hx    Social History   Substance and Sexual Activity  Sexual  Activity Not Currently    Outpatient Encounter Medications as of 04/02/2017  Medication Sig  . albuterol (PROVENTIL HFA;VENTOLIN HFA) 108 (90 Base) MCG/ACT inhaler Inhale 1-2 puffs into the lungs every 6 (six) hours as needed for wheezing or shortness of breath.  . anastrozole (ARIMIDEX) 1 MG tablet Take 1 tablet (1 mg total) by mouth daily.  . budesonide-formoterol (SYMBICORT) 160-4.5 MCG/ACT inhaler Inhale 2 puffs into the lungs 2 (two) times daily. For bronchitis  . cetirizine (ZYRTEC) 10 MG tablet Take 10 mg by mouth at bedtime.  . docusate sodium (COLACE) 100 MG capsule Take 100 mg by mouth daily as needed for mild constipation. TAKE PER BOTTLE AS NEEDED FOR CONSTIPATION   . DULoxetine (CYMBALTA) 60 MG capsule TAKE 1 CAPSULE BY MOUTH EVERY DAY FOR anxiety  . estradiol (ESTRACE) 0.1 MG/GM vaginal cream Use every other evening use in vaginal area  . hydrOXYzine (ATARAX/VISTARIL) 10 MG tablet Take 1 tablet (10 mg total) by mouth 3 (three) times daily as needed for itching.  . insulin aspart (NOVOLOG FLEXPEN) 100 UNIT/ML FlexPen Inject 34 Units into the skin 2 (two) times daily.  . Insulin Glargine (TOUJEO SOLOSTAR) 300 UNIT/ML SOPN Inject 70 Units into the skin 2 (two) times daily. (before breakfast and before supper)  . isosorbide mononitrate (IMDUR) 60 MG 24 hr tablet Take 1 tablet (60 mg total) by mouth daily.  . Lancets (ONETOUCH ULTRASOFT) lancets Check blood sugar 2-3 times daily as directed  . losartan (COZAAR) 25 MG tablet TAKE 1 TABLET BY MOUTH ONCE daily  . meclizine (ANTIVERT) 25 MG tablet Take 1 tablet (25 mg total) by mouth 3 (three) times daily as needed for dizziness.  . memantine (NAMENDA) 5 MG tablet Take 1 tablet (5 mg total) by mouth 2 (two) times daily.  . metoprolol succinate (TOPROL-XL) 25 MG 24 hr tablet Take one tablet by mouth once daily for blood pressure  . nitroGLYCERIN (NITROSTAT) 0.4 MG SL tablet Take one tablet under the tongue every 5 minutes as needed for chest  pain  . ONETOUCH VERIO test strip Use to test blood sugar three times daily. Dx: E11.00  . oxyCODONE-acetaminophen (PERCOCET) 10-325 MG tablet Take 1 tablet every 6 (six) hours by mouth.  . pantoprazole (PROTONIX) 40 MG tablet Take 1 tablet (40 mg total) by mouth daily.  . pravastatin (PRAVACHOL) 20 MG tablet Take one tablet by mouth once daily for cholesterol  . sodium chloride (OCEAN) 0.65 % SOLN nasal spray Place 1 spray into both nostrils 3 (three)  times daily as needed (for sinus congestion.). RINSE AS NEEDED FOR SINUS CONGESTION   . Spacer/Aero-Holding Chambers (AEROCHAMBER PLUS FLO-VU LARGE) MISC 1 each by Other route once.  [name] ULORIC 40 MG tablet TAKE 1 TABLET BY MOUTH ONCE daily  . warfarin (COUMADIN) 5 MG tablet Take 1 tablet daily except 1 & 1/2 tablets on Tuesdays and Thursdays  . [DISCONTINUED] ULORIC 40 MG tablet TAKE ONE TABLET BY MOUTH ONCE DAILY  . [DISCONTINUED] warfarin (COUMADIN) 5 MG tablet Take 1 tablet daily except 1 & 1/2 tablets on Tuesdays and Thursdays   No facility-administered encounter medications on file as of 04/02/2017.     Activities of Daily Living In your present state of health, do you have any difficulty performing the following activities: 04/02/2017  Hearing? N  Vision? N  Difficulty concentrating or making decisions? Y  Walking or climbing stairs? Y  Dressing or bathing? Y  Doing errands, shopping? Y  Preparing Food and eating ? Y  Using the Toilet? Y  In the past six months, have you accidently leaked urine? Y  Do you have problems with loss of bowel control? N  Managing your Medications? Y  Managing your Finances? Y  Housekeeping or managing your Housekeeping? Y  Some recent data might be hidden    Patient Care Team: Gildardo Cranker, DO as PCP - General (Internal Medicine) [name], [name], MD (Inactive) as Consulting Physician (Cardiology) [name], MD as Consulting Physician (Ophthalmology) [name], MD as Consulting  Physician (General Surgery) [name], MD as Referring Physician (Obstetrics and Gynecology)    Assessment:      Exercise Activities and Dietary recommendations Current Exercise Habits: The patient does not participate in regular exercise at present, Exercise limited by: orthopedic condition(s)  Goals    . DIET - EAT MORE FRUITS AND VEGETABLES     Patient will increase vegetables.      Fall Risk Fall Risk  04/02/2017 12/12/2016 11/27/2016 11/22/2016 11/15/2016  Falls in the past year? No No No No No  Number falls in past yr: - - - - -  Comment - - - - -  Injury with Fall? - - - - -  Comment - - - - -  Risk for fall due to : - - - - -   Depression Screen PHQ 2/9 Scores 04/02/2017 03/24/2016 04/21/2015 10/27/2014  PHQ - 2 Score 3 3 6  0  PHQ- 9 Score 10 17 19  -     Cognitive Function MMSE - Mini Mental State Exam 08/02/2016 07/27/2016 03/24/2016 06/04/2013  Orientation to time 4 4 5 4   Orientation to Place 5 5 5 5   Registration 3 3 3 3   Attention/ Calculation 5 5 0 4  Recall 1 1 2 2   Language- name 2 objects 2 2 2 2   Language- repeat 1 1 1 1   Language- follow 3 step command 2 1 3 3   Language- read & follow direction 1 1 1 1   Write a sentence 1 1 1 1   Copy design 1 1 1  0  Total score 26 25 24 26         Immunization History  Administered Date(s) Administered  . Influenza Whole 03/14/2007, 02/13/2008, 01/18/2010  . Influenza, High Dose Seasonal PF 01/16/2017  . Influenza,inj,Quad PF,6+ Mos 01/08/2013, 02/18/2014, 01/18/2015, 01/24/2016  . Influenza-Unspecified 02/15/2012, 02/18/2014  . Pneumococcal Conjugate-13 02/27/2017  . Pneumococcal Polysaccharide-23 05/08/2002  . Td 05/08/2008   Screening Tests Health Maintenance  Topic Date Due  . OPHTHALMOLOGY  EXAM  04/25/2017  . HEMOGLOBIN A1C  07/16/2017  . MAMMOGRAM  08/16/2017  . FOOT EXAM  12/12/2017  . TETANUS/TDAP  05/08/2018  . INFLUENZA VACCINE  Completed  . DEXA SCAN  Completed  . PNA vac Low Risk Adult   Completed      Plan:  I have personally reviewed and addressed the Medicare Annual Wellness questionnaire and have noted the following in the patient's chart:  A. Medical and social history B. Use of alcohol, tobacco or illicit drugs  C. Current medications and supplements D. Functional ability and status E.  Nutritional status F.  Physical activity G. Advance directives H. List of other physicians I.  Hospitalizations, surgeries, and ER visits in previous 12 months J.  [place] to include hearing, vision, cognitive, depression L. Referrals and appointments - none  In addition, I have reviewed and discussed with patient certain preventive protocols, quality metrics, and best practice recommendations. A written personalized care plan for preventive services as well as general preventive health recommendations were provided to patient.  See attached scanned questionnaire for additional information.   Signed,   Rich Reining, RN Nurse Health Advisor   Quick Notes   Health Maintenance: Shingles vaccine declined      Abnormal Screen: pHQ-9:10. MMSE 26/30 on 08/02/2016     Patient Concerns: Itching all over body over last year has gotten worse.      Nurse Concerns: Patient may benefit from a home health referral

## 2017-04-02 NOTE — Telephone Encounter (Signed)
Patient was in office for AWV with Clarise Cruz and requested Home Health Referral, patient would like someone to assist with dressing and personal day to day.  Referral pending, please advise

## 2017-04-04 NOTE — Telephone Encounter (Signed)
Referral is still pending

## 2017-04-06 DIAGNOSIS — E0840 Diabetes mellitus due to underlying condition with diabetic neuropathy, unspecified: Secondary | ICD-10-CM | POA: Diagnosis not present

## 2017-04-06 DIAGNOSIS — M79672 Pain in left foot: Secondary | ICD-10-CM | POA: Diagnosis not present

## 2017-04-06 DIAGNOSIS — M2041 Other hammer toe(s) (acquired), right foot: Secondary | ICD-10-CM | POA: Diagnosis not present

## 2017-04-06 DIAGNOSIS — L84 Corns and callosities: Secondary | ICD-10-CM | POA: Diagnosis not present

## 2017-04-06 DIAGNOSIS — B351 Tinea unguium: Secondary | ICD-10-CM | POA: Diagnosis not present

## 2017-04-07 DIAGNOSIS — F0390 Unspecified dementia without behavioral disturbance: Secondary | ICD-10-CM | POA: Diagnosis not present

## 2017-04-07 DIAGNOSIS — G8929 Other chronic pain: Secondary | ICD-10-CM | POA: Diagnosis not present

## 2017-04-07 DIAGNOSIS — M353 Polymyalgia rheumatica: Secondary | ICD-10-CM | POA: Diagnosis not present

## 2017-04-07 DIAGNOSIS — M109 Gout, unspecified: Secondary | ICD-10-CM | POA: Diagnosis not present

## 2017-04-07 DIAGNOSIS — M1991 Primary osteoarthritis, unspecified site: Secondary | ICD-10-CM | POA: Diagnosis not present

## 2017-04-07 DIAGNOSIS — E1121 Type 2 diabetes mellitus with diabetic nephropathy: Secondary | ICD-10-CM | POA: Diagnosis not present

## 2017-04-07 DIAGNOSIS — I5032 Chronic diastolic (congestive) heart failure: Secondary | ICD-10-CM | POA: Diagnosis not present

## 2017-04-07 DIAGNOSIS — G4733 Obstructive sleep apnea (adult) (pediatric): Secondary | ICD-10-CM | POA: Diagnosis not present

## 2017-04-07 DIAGNOSIS — J449 Chronic obstructive pulmonary disease, unspecified: Secondary | ICD-10-CM | POA: Diagnosis not present

## 2017-04-07 DIAGNOSIS — I110 Hypertensive heart disease with heart failure: Secondary | ICD-10-CM | POA: Diagnosis not present

## 2017-04-07 DIAGNOSIS — F329 Major depressive disorder, single episode, unspecified: Secondary | ICD-10-CM | POA: Diagnosis not present

## 2017-04-07 DIAGNOSIS — F419 Anxiety disorder, unspecified: Secondary | ICD-10-CM | POA: Diagnosis not present

## 2017-04-07 DIAGNOSIS — I2510 Atherosclerotic heart disease of native coronary artery without angina pectoris: Secondary | ICD-10-CM | POA: Diagnosis not present

## 2017-04-16 ENCOUNTER — Ambulatory Visit: Payer: PPO | Admitting: Pharmacotherapy

## 2017-04-18 ENCOUNTER — Other Ambulatory Visit: Payer: Self-pay | Admitting: *Deleted

## 2017-04-18 DIAGNOSIS — G894 Chronic pain syndrome: Secondary | ICD-10-CM

## 2017-04-18 MED ORDER — OXYCODONE-ACETAMINOPHEN 10-325 MG PO TABS: 1.0000 | ORAL_TABLET | Freq: Four times a day (QID) | ORAL | 0 refills | Status: DC

## 2017-04-18 NOTE — Telephone Encounter (Signed)
Patient called and requested refill. Pended medication and routed to Dr. Eulas Post for approval and to [facility] to pharmacy.  [place] [facility] Verified.

## 2017-04-19 ENCOUNTER — Other Ambulatory Visit: Payer: Self-pay

## 2017-04-19 DIAGNOSIS — G894 Chronic pain syndrome: Secondary | ICD-10-CM

## 2017-04-19 NOTE — Telephone Encounter (Signed)
Tried calling patient to inform her that Rx was sent to pharmacy but just rings.

## 2017-04-19 NOTE — Telephone Encounter (Signed)
Prescription was sent to incorrect pharmacy on 04/18/17. The incorrect pharmacy has been notified to disregard prescription. Correct pharmacy has been selected and Rx has been pended to provider.

## 2017-04-20 NOTE — Telephone Encounter (Signed)
Please disregard refill request. A printed Rx was located in filing cabinet at front desk. Patient has been notified to pick up prescription.

## 2017-04-23 ENCOUNTER — Other Ambulatory Visit: Payer: Self-pay | Admitting: Nurse Practitioner

## 2017-04-23 ENCOUNTER — Encounter: Payer: Self-pay | Admitting: Pharmacotherapy

## 2017-04-23 ENCOUNTER — Telehealth: Payer: Self-pay | Admitting: *Deleted

## 2017-04-23 ENCOUNTER — Ambulatory Visit: Payer: PPO | Admitting: Pharmacotherapy

## 2017-04-23 ENCOUNTER — Other Ambulatory Visit: Payer: Self-pay | Admitting: *Deleted

## 2017-04-23 DIAGNOSIS — R059 Cough, unspecified: Secondary | ICD-10-CM

## 2017-04-23 DIAGNOSIS — Z7901 Long term (current) use of anticoagulants: Secondary | ICD-10-CM | POA: Diagnosis not present

## 2017-04-23 DIAGNOSIS — R05 Cough: Secondary | ICD-10-CM

## 2017-04-23 LAB — POCT INR: INR: 1.5

## 2017-04-23 MED ORDER — PRAVASTATIN SODIUM 20 MG PO TABS: ORAL_TABLET | ORAL | 1 refills | Status: DC

## 2017-04-23 MED ORDER — METOPROLOL SUCCINATE ER 25 MG PO TB24: ORAL_TABLET | ORAL | 1 refills | Status: DC

## 2017-04-23 NOTE — Telephone Encounter (Signed)
[facility] calling for verbal orders 2 times a week for 1 week 1 times a week for 4 week 2 times a week for 6 weeks ( home care aid) Then wanted PT/OT

## 2017-04-23 NOTE — Telephone Encounter (Signed)
[facility] 

## 2017-04-23 NOTE — Telephone Encounter (Signed)
Ok as written

## 2017-04-23 NOTE — Progress Notes (Signed)
   Subjective:    Patient ID: [name], female    DOB: 04-03-1935, 82 y.o.   MRN: [id number]  HPI Last INR on 04/02/17 was high at 4.1 Her current Coumadin dose is 5mg  QD except 7.5mg  T/Th She did miss a dose last week, and missed a dose yesterday Denies unusual bleeding or bruising. Denies CP, SOB, falls Consistent with vitamin K intake.   Review of Systems  HENT: Negative for nosebleeds.   Respiratory: Negative for shortness of breath.   Cardiovascular: Negative for chest pain.  Gastrointestinal: Negative for anal bleeding and blood in stool.  Genitourinary: Negative for hematuria.  Hematological: Does not bruise/bleed easily.       Objective:   Physical Exam  Constitutional: She is oriented to person, place, and time. She appears well-developed and well-nourished.  HENT:  Right Ear: External ear normal.  Left Ear: External ear normal.  Cardiovascular: Normal rate, regular rhythm and normal heart sounds.  Pulmonary/Chest: Effort normal and breath sounds normal.  Neurological: She is alert and oriented to person, place, and time.  Skin: Skin is warm and dry.  Psychiatric: She has a normal mood and affect. Her behavior is normal. Judgment and thought content normal.  Vitals reviewed.    BP: 138/70  HR: 87  Wt: 340lb INR 1.5     Assessment & Plan:  1.  INR below goal 2-3 due to missed doses. 2.  Today - take Coumadin 7.5mg  as a booster dose 3.  Then start Coumadin 5mg  QD except 7.5mg  T/Th 4.  RTC 1 month

## 2017-04-23 NOTE — Telephone Encounter (Signed)
Verbal order given  

## 2017-04-24 ENCOUNTER — Other Ambulatory Visit: Payer: Self-pay | Admitting: Internal Medicine

## 2017-04-24 DIAGNOSIS — I110 Hypertensive heart disease with heart failure: Secondary | ICD-10-CM | POA: Diagnosis not present

## 2017-04-24 DIAGNOSIS — E1121 Type 2 diabetes mellitus with diabetic nephropathy: Secondary | ICD-10-CM | POA: Diagnosis not present

## 2017-04-24 DIAGNOSIS — M353 Polymyalgia rheumatica: Secondary | ICD-10-CM | POA: Diagnosis not present

## 2017-04-24 DIAGNOSIS — M1991 Primary osteoarthritis, unspecified site: Secondary | ICD-10-CM | POA: Diagnosis not present

## 2017-04-24 DIAGNOSIS — F419 Anxiety disorder, unspecified: Secondary | ICD-10-CM | POA: Diagnosis not present

## 2017-04-24 DIAGNOSIS — G4733 Obstructive sleep apnea (adult) (pediatric): Secondary | ICD-10-CM | POA: Diagnosis not present

## 2017-04-24 DIAGNOSIS — I2510 Atherosclerotic heart disease of native coronary artery without angina pectoris: Secondary | ICD-10-CM | POA: Diagnosis not present

## 2017-04-24 DIAGNOSIS — G8929 Other chronic pain: Secondary | ICD-10-CM | POA: Diagnosis not present

## 2017-04-24 DIAGNOSIS — I5032 Chronic diastolic (congestive) heart failure: Secondary | ICD-10-CM | POA: Diagnosis not present

## 2017-04-24 DIAGNOSIS — F0390 Unspecified dementia without behavioral disturbance: Secondary | ICD-10-CM | POA: Diagnosis not present

## 2017-04-24 DIAGNOSIS — J449 Chronic obstructive pulmonary disease, unspecified: Secondary | ICD-10-CM | POA: Diagnosis not present

## 2017-04-24 DIAGNOSIS — F329 Major depressive disorder, single episode, unspecified: Secondary | ICD-10-CM | POA: Diagnosis not present

## 2017-04-24 DIAGNOSIS — M109 Gout, unspecified: Secondary | ICD-10-CM | POA: Diagnosis not present

## 2017-04-25 ENCOUNTER — Other Ambulatory Visit: Payer: Self-pay

## 2017-04-25 MED ORDER — DULOXETINE HCL 60 MG PO CPEP: ORAL_CAPSULE | ORAL | 0 refills | Status: DC

## 2017-04-26 ENCOUNTER — Telehealth: Payer: Self-pay | Admitting: *Deleted

## 2017-04-26 NOTE — Telephone Encounter (Signed)
[name] with [facility] called and stated that she saw the patient today and patient has a lot of chest congestion and wonders if it would be ok to recommend patient to take Mucinex or Zyrtec.   Also stated that she took patient's blood pressure today and it was 180/88 before medications. She had her take her medicine and waited 15 minutes and rechecked and it was 172/82. All other vitals fine.  Please Advise.

## 2017-04-27 NOTE — Telephone Encounter (Signed)
Pt is already on zyrtec. Ok for mucinex BID for congestion. Recommend check BP 1 hour after taking medications daily. Call office if consistently above 160/100

## 2017-04-27 NOTE — Telephone Encounter (Signed)
Kelly notified and agreed.  °

## 2017-05-03 DIAGNOSIS — M109 Gout, unspecified: Secondary | ICD-10-CM | POA: Diagnosis not present

## 2017-05-03 DIAGNOSIS — G8929 Other chronic pain: Secondary | ICD-10-CM | POA: Diagnosis not present

## 2017-05-03 DIAGNOSIS — J449 Chronic obstructive pulmonary disease, unspecified: Secondary | ICD-10-CM | POA: Diagnosis not present

## 2017-05-03 DIAGNOSIS — F0390 Unspecified dementia without behavioral disturbance: Secondary | ICD-10-CM | POA: Diagnosis not present

## 2017-05-03 DIAGNOSIS — I110 Hypertensive heart disease with heart failure: Secondary | ICD-10-CM | POA: Diagnosis not present

## 2017-05-03 DIAGNOSIS — G4733 Obstructive sleep apnea (adult) (pediatric): Secondary | ICD-10-CM | POA: Diagnosis not present

## 2017-05-03 DIAGNOSIS — F329 Major depressive disorder, single episode, unspecified: Secondary | ICD-10-CM | POA: Diagnosis not present

## 2017-05-03 DIAGNOSIS — F419 Anxiety disorder, unspecified: Secondary | ICD-10-CM | POA: Diagnosis not present

## 2017-05-03 DIAGNOSIS — I2510 Atherosclerotic heart disease of native coronary artery without angina pectoris: Secondary | ICD-10-CM | POA: Diagnosis not present

## 2017-05-03 DIAGNOSIS — E1121 Type 2 diabetes mellitus with diabetic nephropathy: Secondary | ICD-10-CM | POA: Diagnosis not present

## 2017-05-03 DIAGNOSIS — M1991 Primary osteoarthritis, unspecified site: Secondary | ICD-10-CM | POA: Diagnosis not present

## 2017-05-03 DIAGNOSIS — I5032 Chronic diastolic (congestive) heart failure: Secondary | ICD-10-CM | POA: Diagnosis not present

## 2017-05-03 DIAGNOSIS — M353 Polymyalgia rheumatica: Secondary | ICD-10-CM | POA: Diagnosis not present

## 2017-05-10 DIAGNOSIS — I5032 Chronic diastolic (congestive) heart failure: Secondary | ICD-10-CM | POA: Diagnosis not present

## 2017-05-10 DIAGNOSIS — I2510 Atherosclerotic heart disease of native coronary artery without angina pectoris: Secondary | ICD-10-CM | POA: Diagnosis not present

## 2017-05-10 DIAGNOSIS — I110 Hypertensive heart disease with heart failure: Secondary | ICD-10-CM | POA: Diagnosis not present

## 2017-05-10 DIAGNOSIS — E1121 Type 2 diabetes mellitus with diabetic nephropathy: Secondary | ICD-10-CM | POA: Diagnosis not present

## 2017-05-10 DIAGNOSIS — M109 Gout, unspecified: Secondary | ICD-10-CM | POA: Diagnosis not present

## 2017-05-10 DIAGNOSIS — M1991 Primary osteoarthritis, unspecified site: Secondary | ICD-10-CM | POA: Diagnosis not present

## 2017-05-10 DIAGNOSIS — G4733 Obstructive sleep apnea (adult) (pediatric): Secondary | ICD-10-CM | POA: Diagnosis not present

## 2017-05-10 DIAGNOSIS — F0390 Unspecified dementia without behavioral disturbance: Secondary | ICD-10-CM | POA: Diagnosis not present

## 2017-05-10 DIAGNOSIS — G8929 Other chronic pain: Secondary | ICD-10-CM | POA: Diagnosis not present

## 2017-05-10 DIAGNOSIS — F419 Anxiety disorder, unspecified: Secondary | ICD-10-CM | POA: Diagnosis not present

## 2017-05-10 DIAGNOSIS — M353 Polymyalgia rheumatica: Secondary | ICD-10-CM | POA: Diagnosis not present

## 2017-05-10 DIAGNOSIS — F329 Major depressive disorder, single episode, unspecified: Secondary | ICD-10-CM | POA: Diagnosis not present

## 2017-05-10 DIAGNOSIS — J449 Chronic obstructive pulmonary disease, unspecified: Secondary | ICD-10-CM | POA: Diagnosis not present

## 2017-05-14 ENCOUNTER — Ambulatory Visit: Payer: PPO | Admitting: Nurse Practitioner

## 2017-05-14 DIAGNOSIS — G8929 Other chronic pain: Secondary | ICD-10-CM | POA: Diagnosis not present

## 2017-05-14 DIAGNOSIS — G4733 Obstructive sleep apnea (adult) (pediatric): Secondary | ICD-10-CM | POA: Diagnosis not present

## 2017-05-14 DIAGNOSIS — I5032 Chronic diastolic (congestive) heart failure: Secondary | ICD-10-CM | POA: Diagnosis not present

## 2017-05-14 DIAGNOSIS — F419 Anxiety disorder, unspecified: Secondary | ICD-10-CM | POA: Diagnosis not present

## 2017-05-14 DIAGNOSIS — M1991 Primary osteoarthritis, unspecified site: Secondary | ICD-10-CM | POA: Diagnosis not present

## 2017-05-14 DIAGNOSIS — I110 Hypertensive heart disease with heart failure: Secondary | ICD-10-CM | POA: Diagnosis not present

## 2017-05-14 DIAGNOSIS — J449 Chronic obstructive pulmonary disease, unspecified: Secondary | ICD-10-CM | POA: Diagnosis not present

## 2017-05-14 DIAGNOSIS — F0390 Unspecified dementia without behavioral disturbance: Secondary | ICD-10-CM | POA: Diagnosis not present

## 2017-05-14 DIAGNOSIS — E1121 Type 2 diabetes mellitus with diabetic nephropathy: Secondary | ICD-10-CM | POA: Diagnosis not present

## 2017-05-14 DIAGNOSIS — F329 Major depressive disorder, single episode, unspecified: Secondary | ICD-10-CM | POA: Diagnosis not present

## 2017-05-14 DIAGNOSIS — I2510 Atherosclerotic heart disease of native coronary artery without angina pectoris: Secondary | ICD-10-CM | POA: Diagnosis not present

## 2017-05-14 DIAGNOSIS — M353 Polymyalgia rheumatica: Secondary | ICD-10-CM | POA: Diagnosis not present

## 2017-05-14 DIAGNOSIS — M109 Gout, unspecified: Secondary | ICD-10-CM | POA: Diagnosis not present

## 2017-05-15 DIAGNOSIS — G8929 Other chronic pain: Secondary | ICD-10-CM | POA: Diagnosis not present

## 2017-05-15 DIAGNOSIS — G4733 Obstructive sleep apnea (adult) (pediatric): Secondary | ICD-10-CM | POA: Diagnosis not present

## 2017-05-15 DIAGNOSIS — I2510 Atherosclerotic heart disease of native coronary artery without angina pectoris: Secondary | ICD-10-CM | POA: Diagnosis not present

## 2017-05-15 DIAGNOSIS — M353 Polymyalgia rheumatica: Secondary | ICD-10-CM | POA: Diagnosis not present

## 2017-05-15 DIAGNOSIS — I5032 Chronic diastolic (congestive) heart failure: Secondary | ICD-10-CM | POA: Diagnosis not present

## 2017-05-15 DIAGNOSIS — M109 Gout, unspecified: Secondary | ICD-10-CM | POA: Diagnosis not present

## 2017-05-15 DIAGNOSIS — J449 Chronic obstructive pulmonary disease, unspecified: Secondary | ICD-10-CM | POA: Diagnosis not present

## 2017-05-15 DIAGNOSIS — E1121 Type 2 diabetes mellitus with diabetic nephropathy: Secondary | ICD-10-CM | POA: Diagnosis not present

## 2017-05-15 DIAGNOSIS — F0390 Unspecified dementia without behavioral disturbance: Secondary | ICD-10-CM | POA: Diagnosis not present

## 2017-05-15 DIAGNOSIS — F329 Major depressive disorder, single episode, unspecified: Secondary | ICD-10-CM | POA: Diagnosis not present

## 2017-05-15 DIAGNOSIS — I110 Hypertensive heart disease with heart failure: Secondary | ICD-10-CM | POA: Diagnosis not present

## 2017-05-15 DIAGNOSIS — M1991 Primary osteoarthritis, unspecified site: Secondary | ICD-10-CM | POA: Diagnosis not present

## 2017-05-15 DIAGNOSIS — F419 Anxiety disorder, unspecified: Secondary | ICD-10-CM | POA: Diagnosis not present

## 2017-05-17 ENCOUNTER — Telehealth: Payer: Self-pay | Admitting: *Deleted

## 2017-05-17 NOTE — Telephone Encounter (Signed)
[name] called and stated that she needed orders for PT 2x3wks and 1x1wk. Verbal orders given.

## 2017-05-18 ENCOUNTER — Other Ambulatory Visit: Payer: Self-pay | Admitting: *Deleted

## 2017-05-18 DIAGNOSIS — G894 Chronic pain syndrome: Secondary | ICD-10-CM

## 2017-05-18 MED ORDER — OXYCODONE-ACETAMINOPHEN 10-325 MG PO TABS: 1.0000 | ORAL_TABLET | Freq: Four times a day (QID) | ORAL | 0 refills | Status: DC

## 2017-05-18 NOTE — Telephone Encounter (Signed)
Patient requested refill [place] Confirmed Pended Rx and sent to Dr. Eulas Post for approval.

## 2017-05-21 ENCOUNTER — Ambulatory Visit (INDEPENDENT_AMBULATORY_CARE_PROVIDER_SITE_OTHER): Payer: PPO | Admitting: Pharmacotherapy

## 2017-05-21 ENCOUNTER — Encounter: Payer: Self-pay | Admitting: Pharmacotherapy

## 2017-05-21 DIAGNOSIS — Z7901 Long term (current) use of anticoagulants: Secondary | ICD-10-CM

## 2017-05-21 LAB — POCT INR: INR: 2.6

## 2017-05-21 NOTE — Progress Notes (Signed)
   Subjective:    Patient ID: [name], female    DOB: 27-Jun-1934, 83 y.o.   MRN: [id number]  HPI Last INR on 04/23/17 was low at 1.5 Coumadin was increased to 66m QD except 7.569mT/Th Denies CP, palpitations, falls Denies unusual bleeding or bruising Consistent with vitamin K intake   Review of Systems  HENT: Negative for nosebleeds.   Cardiovascular: Negative for chest pain and palpitations.  Gastrointestinal: Negative for anal bleeding and blood in stool.  Genitourinary: Negative for hematuria.  Hematological: Does not bruise/bleed easily.       Objective:   Physical Exam  Constitutional: She is oriented to person, place, and time. She appears well-developed and well-nourished.  HENT:  Right Ear: External ear normal.  Left Ear: External ear normal.  Cardiovascular: Normal rate, regular rhythm and normal heart sounds.  Pulmonary/Chest: Effort normal and breath sounds normal.  Neurological: She is alert and oriented to person, place, and time.  Skin: Skin is dry.  Psychiatric: She has a normal mood and affect. Her behavior is normal. Judgment and thought content normal.  Vitals reviewed.   BP: 122/74  HR: 75  Wt:  336lb INR 2.6      Assessment & Plan:  1.  INR at goal 2-3 2.  Continue Coumadin 37m63mD except 7.37mg23mTh 3.  RTC 1 month

## 2017-05-22 DIAGNOSIS — I2510 Atherosclerotic heart disease of native coronary artery without angina pectoris: Secondary | ICD-10-CM | POA: Diagnosis not present

## 2017-05-22 DIAGNOSIS — F0390 Unspecified dementia without behavioral disturbance: Secondary | ICD-10-CM | POA: Diagnosis not present

## 2017-05-22 DIAGNOSIS — G8929 Other chronic pain: Secondary | ICD-10-CM | POA: Diagnosis not present

## 2017-05-22 DIAGNOSIS — M353 Polymyalgia rheumatica: Secondary | ICD-10-CM | POA: Diagnosis not present

## 2017-05-22 DIAGNOSIS — E1121 Type 2 diabetes mellitus with diabetic nephropathy: Secondary | ICD-10-CM | POA: Diagnosis not present

## 2017-05-22 DIAGNOSIS — M1991 Primary osteoarthritis, unspecified site: Secondary | ICD-10-CM | POA: Diagnosis not present

## 2017-05-22 DIAGNOSIS — I110 Hypertensive heart disease with heart failure: Secondary | ICD-10-CM | POA: Diagnosis not present

## 2017-05-22 DIAGNOSIS — G4733 Obstructive sleep apnea (adult) (pediatric): Secondary | ICD-10-CM | POA: Diagnosis not present

## 2017-05-22 DIAGNOSIS — F329 Major depressive disorder, single episode, unspecified: Secondary | ICD-10-CM | POA: Diagnosis not present

## 2017-05-22 DIAGNOSIS — F419 Anxiety disorder, unspecified: Secondary | ICD-10-CM | POA: Diagnosis not present

## 2017-05-22 DIAGNOSIS — N952 Postmenopausal atrophic vaginitis: Secondary | ICD-10-CM | POA: Diagnosis not present

## 2017-05-22 DIAGNOSIS — J449 Chronic obstructive pulmonary disease, unspecified: Secondary | ICD-10-CM | POA: Diagnosis not present

## 2017-05-22 DIAGNOSIS — I5032 Chronic diastolic (congestive) heart failure: Secondary | ICD-10-CM | POA: Diagnosis not present

## 2017-05-22 DIAGNOSIS — M109 Gout, unspecified: Secondary | ICD-10-CM | POA: Diagnosis not present

## 2017-05-22 DIAGNOSIS — R351 Nocturia: Secondary | ICD-10-CM | POA: Diagnosis not present

## 2017-05-23 DIAGNOSIS — F0390 Unspecified dementia without behavioral disturbance: Secondary | ICD-10-CM | POA: Diagnosis not present

## 2017-05-23 DIAGNOSIS — J449 Chronic obstructive pulmonary disease, unspecified: Secondary | ICD-10-CM | POA: Diagnosis not present

## 2017-05-23 DIAGNOSIS — G8929 Other chronic pain: Secondary | ICD-10-CM | POA: Diagnosis not present

## 2017-05-23 DIAGNOSIS — M1991 Primary osteoarthritis, unspecified site: Secondary | ICD-10-CM | POA: Diagnosis not present

## 2017-05-23 DIAGNOSIS — E1121 Type 2 diabetes mellitus with diabetic nephropathy: Secondary | ICD-10-CM | POA: Diagnosis not present

## 2017-05-23 DIAGNOSIS — F329 Major depressive disorder, single episode, unspecified: Secondary | ICD-10-CM | POA: Diagnosis not present

## 2017-05-23 DIAGNOSIS — M353 Polymyalgia rheumatica: Secondary | ICD-10-CM | POA: Diagnosis not present

## 2017-05-23 DIAGNOSIS — I110 Hypertensive heart disease with heart failure: Secondary | ICD-10-CM | POA: Diagnosis not present

## 2017-05-23 DIAGNOSIS — I5032 Chronic diastolic (congestive) heart failure: Secondary | ICD-10-CM | POA: Diagnosis not present

## 2017-05-23 DIAGNOSIS — I2510 Atherosclerotic heart disease of native coronary artery without angina pectoris: Secondary | ICD-10-CM | POA: Diagnosis not present

## 2017-05-23 DIAGNOSIS — M109 Gout, unspecified: Secondary | ICD-10-CM | POA: Diagnosis not present

## 2017-05-23 DIAGNOSIS — F419 Anxiety disorder, unspecified: Secondary | ICD-10-CM | POA: Diagnosis not present

## 2017-05-23 DIAGNOSIS — G4733 Obstructive sleep apnea (adult) (pediatric): Secondary | ICD-10-CM | POA: Diagnosis not present

## 2017-05-28 DIAGNOSIS — M1991 Primary osteoarthritis, unspecified site: Secondary | ICD-10-CM | POA: Diagnosis not present

## 2017-05-28 DIAGNOSIS — F0390 Unspecified dementia without behavioral disturbance: Secondary | ICD-10-CM | POA: Diagnosis not present

## 2017-05-28 DIAGNOSIS — J449 Chronic obstructive pulmonary disease, unspecified: Secondary | ICD-10-CM | POA: Diagnosis not present

## 2017-05-28 DIAGNOSIS — E1121 Type 2 diabetes mellitus with diabetic nephropathy: Secondary | ICD-10-CM | POA: Diagnosis not present

## 2017-05-28 DIAGNOSIS — I110 Hypertensive heart disease with heart failure: Secondary | ICD-10-CM | POA: Diagnosis not present

## 2017-05-28 DIAGNOSIS — F419 Anxiety disorder, unspecified: Secondary | ICD-10-CM | POA: Diagnosis not present

## 2017-05-28 DIAGNOSIS — M353 Polymyalgia rheumatica: Secondary | ICD-10-CM | POA: Diagnosis not present

## 2017-05-28 DIAGNOSIS — F329 Major depressive disorder, single episode, unspecified: Secondary | ICD-10-CM | POA: Diagnosis not present

## 2017-05-28 DIAGNOSIS — G8929 Other chronic pain: Secondary | ICD-10-CM | POA: Diagnosis not present

## 2017-05-28 DIAGNOSIS — I5032 Chronic diastolic (congestive) heart failure: Secondary | ICD-10-CM | POA: Diagnosis not present

## 2017-05-28 DIAGNOSIS — G4733 Obstructive sleep apnea (adult) (pediatric): Secondary | ICD-10-CM | POA: Diagnosis not present

## 2017-05-28 DIAGNOSIS — M109 Gout, unspecified: Secondary | ICD-10-CM | POA: Diagnosis not present

## 2017-05-28 DIAGNOSIS — I2510 Atherosclerotic heart disease of native coronary artery without angina pectoris: Secondary | ICD-10-CM | POA: Diagnosis not present

## 2017-05-29 ENCOUNTER — Telehealth: Payer: Self-pay

## 2017-05-29 DIAGNOSIS — G8929 Other chronic pain: Secondary | ICD-10-CM | POA: Diagnosis not present

## 2017-05-29 DIAGNOSIS — F0390 Unspecified dementia without behavioral disturbance: Secondary | ICD-10-CM | POA: Diagnosis not present

## 2017-05-29 DIAGNOSIS — I2510 Atherosclerotic heart disease of native coronary artery without angina pectoris: Secondary | ICD-10-CM | POA: Diagnosis not present

## 2017-05-29 DIAGNOSIS — M109 Gout, unspecified: Secondary | ICD-10-CM | POA: Diagnosis not present

## 2017-05-29 DIAGNOSIS — J449 Chronic obstructive pulmonary disease, unspecified: Secondary | ICD-10-CM | POA: Diagnosis not present

## 2017-05-29 DIAGNOSIS — E1121 Type 2 diabetes mellitus with diabetic nephropathy: Secondary | ICD-10-CM | POA: Diagnosis not present

## 2017-05-29 DIAGNOSIS — F419 Anxiety disorder, unspecified: Secondary | ICD-10-CM | POA: Diagnosis not present

## 2017-05-29 DIAGNOSIS — F329 Major depressive disorder, single episode, unspecified: Secondary | ICD-10-CM | POA: Diagnosis not present

## 2017-05-29 DIAGNOSIS — I5032 Chronic diastolic (congestive) heart failure: Secondary | ICD-10-CM | POA: Diagnosis not present

## 2017-05-29 DIAGNOSIS — G4733 Obstructive sleep apnea (adult) (pediatric): Secondary | ICD-10-CM | POA: Diagnosis not present

## 2017-05-29 DIAGNOSIS — M353 Polymyalgia rheumatica: Secondary | ICD-10-CM | POA: Diagnosis not present

## 2017-05-29 DIAGNOSIS — M1991 Primary osteoarthritis, unspecified site: Secondary | ICD-10-CM | POA: Diagnosis not present

## 2017-05-29 DIAGNOSIS — I110 Hypertensive heart disease with heart failure: Secondary | ICD-10-CM | POA: Diagnosis not present

## 2017-05-29 NOTE — Telephone Encounter (Signed)
Patients sister called requesting refill on clonazepam.   I reviewed medication list and clonazepam was not present on active medications list. [name] states clonazepam help [name] and should be added back to her medication list, then refilled.  I reviewed previous encounters and was unable to locate when medication was removed off medication list and why.  Please advise if medication to be added back and refilled. (please provide dose, instructions, dispense number and allowed refills)

## 2017-05-30 NOTE — Telephone Encounter (Signed)
Med has not been on her active med list in several months. She needs to be seen to determine if it can be resumed.

## 2017-05-30 NOTE — Telephone Encounter (Signed)
Spoke with [name] verbalized understanding of Dr.Carter's response and did not wish to schedule appointment at this time

## 2017-06-04 DIAGNOSIS — G8929 Other chronic pain: Secondary | ICD-10-CM | POA: Diagnosis not present

## 2017-06-04 DIAGNOSIS — F0390 Unspecified dementia without behavioral disturbance: Secondary | ICD-10-CM | POA: Diagnosis not present

## 2017-06-04 DIAGNOSIS — I110 Hypertensive heart disease with heart failure: Secondary | ICD-10-CM | POA: Diagnosis not present

## 2017-06-04 DIAGNOSIS — I5032 Chronic diastolic (congestive) heart failure: Secondary | ICD-10-CM | POA: Diagnosis not present

## 2017-06-04 DIAGNOSIS — M353 Polymyalgia rheumatica: Secondary | ICD-10-CM | POA: Diagnosis not present

## 2017-06-04 DIAGNOSIS — F329 Major depressive disorder, single episode, unspecified: Secondary | ICD-10-CM | POA: Diagnosis not present

## 2017-06-04 DIAGNOSIS — M1991 Primary osteoarthritis, unspecified site: Secondary | ICD-10-CM | POA: Diagnosis not present

## 2017-06-04 DIAGNOSIS — E1121 Type 2 diabetes mellitus with diabetic nephropathy: Secondary | ICD-10-CM | POA: Diagnosis not present

## 2017-06-04 DIAGNOSIS — G4733 Obstructive sleep apnea (adult) (pediatric): Secondary | ICD-10-CM | POA: Diagnosis not present

## 2017-06-04 DIAGNOSIS — I2510 Atherosclerotic heart disease of native coronary artery without angina pectoris: Secondary | ICD-10-CM | POA: Diagnosis not present

## 2017-06-04 DIAGNOSIS — F419 Anxiety disorder, unspecified: Secondary | ICD-10-CM | POA: Diagnosis not present

## 2017-06-04 DIAGNOSIS — M109 Gout, unspecified: Secondary | ICD-10-CM | POA: Diagnosis not present

## 2017-06-04 DIAGNOSIS — J449 Chronic obstructive pulmonary disease, unspecified: Secondary | ICD-10-CM | POA: Diagnosis not present

## 2017-06-06 DIAGNOSIS — I5032 Chronic diastolic (congestive) heart failure: Secondary | ICD-10-CM | POA: Diagnosis not present

## 2017-06-06 DIAGNOSIS — I252 Old myocardial infarction: Secondary | ICD-10-CM

## 2017-06-06 DIAGNOSIS — M109 Gout, unspecified: Secondary | ICD-10-CM | POA: Diagnosis not present

## 2017-06-06 DIAGNOSIS — M1991 Primary osteoarthritis, unspecified site: Secondary | ICD-10-CM | POA: Diagnosis not present

## 2017-06-06 DIAGNOSIS — E1121 Type 2 diabetes mellitus with diabetic nephropathy: Secondary | ICD-10-CM

## 2017-06-06 DIAGNOSIS — G8929 Other chronic pain: Secondary | ICD-10-CM | POA: Diagnosis not present

## 2017-06-06 DIAGNOSIS — I110 Hypertensive heart disease with heart failure: Secondary | ICD-10-CM

## 2017-06-06 DIAGNOSIS — M353 Polymyalgia rheumatica: Secondary | ICD-10-CM | POA: Diagnosis not present

## 2017-06-08 ENCOUNTER — Encounter: Payer: Self-pay | Admitting: Internal Medicine

## 2017-06-08 ENCOUNTER — Ambulatory Visit (INDEPENDENT_AMBULATORY_CARE_PROVIDER_SITE_OTHER): Payer: PPO | Admitting: Internal Medicine

## 2017-06-08 VITALS — BP 128/80 | HR 84 | Temp 98.2°F | Wt 345.0 lb

## 2017-06-08 DIAGNOSIS — IMO0002 Reserved for concepts with insufficient information to code with codable children: Secondary | ICD-10-CM

## 2017-06-08 DIAGNOSIS — I2782 Chronic pulmonary embolism: Secondary | ICD-10-CM

## 2017-06-08 DIAGNOSIS — F332 Major depressive disorder, recurrent severe without psychotic features: Secondary | ICD-10-CM

## 2017-06-08 DIAGNOSIS — R413 Other amnesia: Secondary | ICD-10-CM

## 2017-06-08 DIAGNOSIS — E1121 Type 2 diabetes mellitus with diabetic nephropathy: Secondary | ICD-10-CM

## 2017-06-08 DIAGNOSIS — Z794 Long term (current) use of insulin: Secondary | ICD-10-CM | POA: Diagnosis not present

## 2017-06-08 DIAGNOSIS — E1165 Type 2 diabetes mellitus with hyperglycemia: Secondary | ICD-10-CM

## 2017-06-08 DIAGNOSIS — F418 Other specified anxiety disorders: Secondary | ICD-10-CM | POA: Insufficient documentation

## 2017-06-08 DIAGNOSIS — E782 Mixed hyperlipidemia: Secondary | ICD-10-CM | POA: Diagnosis not present

## 2017-06-08 DIAGNOSIS — Z86718 Personal history of other venous thrombosis and embolism: Secondary | ICD-10-CM | POA: Diagnosis not present

## 2017-06-08 HISTORY — DX: Other specified anxiety disorders: F41.8

## 2017-06-08 MED ORDER — MEMANTINE HCL 5 MG PO TABS: ORAL_TABLET | ORAL | 6 refills | Status: DC

## 2017-06-08 MED ORDER — CLONAZEPAM 1 MG PO TABS: 2.0000 mg | ORAL_TABLET | Freq: Every day | ORAL | 3 refills | Status: DC

## 2017-06-08 NOTE — Progress Notes (Addendum)
Patient ID: [name], female   DOB: 05/16/1934, 83 y.o.   MRN: [id number]   Location:  [facility] OFFICE  Provider: DR Arletha Grippe  Code Status:  Goals of Care:  Advanced Directives 04/02/2017  Does Patient Have a Medical Advance Directive? Yes  Type of Paramedic of [place];Living will  Does patient want to make changes to medical advance directive? No - Patient declined  Copy of [place] in Chart? No - copy requested  Would patient like information on creating a medical advance directive? -  Pre-existing out of facility DNR order (yellow form or pink MOST form) -     Chief Complaint  Patient presents with  . Follow-up    med refill Clonazepam    HPI: Patient is a 83 y.o. female seen today for medical management of chronic diseases.  She has trouble sleeping at night. She has taken clonopin in past which helped. She feels stressed most days and has increased anxiety.   Hx RLE DVT/hx PE - stable on coumadin. INR 2.6. GOAL INR 2-3. Followed by coumadin nurse. No bleeding  DM - BS <150 most days. She gets Novolog 34 units BID; Toujeo 70 units BID. She has polydipsia and polyphagia. A1c 9.6%. No low BS reactions  Chronic bronchitis - improved SOB on symbicort. She has severe cough that is improved on diabetic Tussin  Dry skin dermatitis - itching improved with change to dye free/parfume free detergent.   Pt is a poor historian due to dementia. Hx obtained from chart and daughter  Dementia - MMSE 26/30. Unchanged on namenda. She tried aricept but experienced GI upset/loose stools and stopped medication.     Past Medical History:  Diagnosis Date  . Allergy    takes Mucinex daily as needed  . Anemia, unspecified   . Anxiety    takes Clonazepam daily as needed  . Arthritis   . CHF (congestive heart failure) (HCC)    takes Furosemide daily  . Coronary artery disease    a. s/p IMI 2004 tx with BMS to RCA;  b. s/p Promus DES to  RCA 2/12 (cath: LM 20-30%, pLAD 20-30%, mLAD 50%, RI 30%, pRCA 60%, mRCA 90% - tx with PCI);  c. myoview 1/07: EF 49%, inf scar, no isch  . Coronary atherosclerosis of native coronary artery   . Depression    takes Cymbalta daily  . Diabetes mellitus    insulin daily  . Dysuria   . GERD (gastroesophageal reflux disease)    takes Protonix daily  . Gout, unspecified    takes Colchicine daily  . Headache    occasionally  . History of blood clots about 82yr ago   in legs-takes Coumadin   . Hyperlipidemia    takes Pravastatin daily  . Hypertension    takes Imdur and Metoprolol daily  . Insomnia   . Joint pain   . Joint swelling   . Myocardial infarction ([place] 2004  . Numbness   . Obstructive sleep apnea   . Osteoarthritis   . Osteoarthritis   . Osteoarthrosis, unspecified whether generalized or localized, lower leg   . Pain, chronic   . Polymyalgia rheumatica ([facility]   . Pulmonary emboli ([facility] 9/13   felt to need lifelong anticoagulation  . Shortness of breath dyspnea    with exertion and has Albuterol inhaler prn  . Type II or unspecified type diabetes mellitus without mention of complication, uncontrolled   . Urinary incontinence  takes Linzess daily  . Vertigo    hx of;was taking Meclizine if needed    Past Surgical History:  Procedure Laterality Date  . ABDOMINAL HYSTERECTOMY     partial  . APPENDECTOMY    . blood clots/legs and lungs  2013  . BREAST BIOPSY Left 07/22/2014  . BREAST BIOPSY Left 02/10/2013  . BREAST LUMPECTOMY Left 11/05/2014  . BREAST LUMPECTOMY WITH RADIOACTIVE SEED LOCALIZATION Left 11/05/2014   Procedure: LEFT BREAST LUMPECTOMY WITH RADIOACTIVE SEED LOCALIZATION;  Surgeon: [name], MD;  Location: [place];  Service: General;  Laterality: Left;  . CARDIAC CATHETERIZATION    . COLONOSCOPY    . CORONARY ANGIOPLASTY  2  . ESOPHAGOGASTRODUODENOSCOPY (EGD) WITH PROPOFOL N/A 11/07/2016   Procedure: ESOPHAGOGASTRODUODENOSCOPY (EGD) WITH PROPOFOL;   Surgeon: Gatha Mayer, MD;  Location: [facility];  Service: Endoscopy;  Laterality: N/A;  . EXCISION OF SKIN TAG Right 11/05/2014   Procedure: EXCISION OF RIGHT EYELID SKIN TAG;  Surgeon: [name], MD;  Location: [place];  Service: General;  Laterality: Right;  . EYE SURGERY Bilateral    cataract   . GASTRIC BYPASS  1977    reversed in 1979, [place] N/A 06/29/2014   Procedure: LEFT HEART CATHETERIZATION WITH CORONARY ANGIOGRAM;  Surgeon: Troy Sine, MD;  Location: [facility] C[facility] LAB;  Service: Cardiovascular;  Laterality: N/A;  . MI with stent placement  2004     reports that  has never smoked. she has never used smokeless tobacco. She reports that she does not drink alcohol or use drugs. Social History   Socioeconomic History  . Marital status: Widowed    Spouse name: Not on file  . Number of children: Not on file  . Years of education: Not on file  . Highest education level: Not on file  Social Needs  . Financial resource strain: Not hard at all  . Food insecurity - worry: Never true  . Food insecurity - inability: Never true  . Transportation needs - medical: Yes  . Transportation needs - non-medical: Yes  Occupational History  . Occupation: retired  Tobacco Use  . Smoking status: Never Smoker  . Smokeless tobacco: Never Used  Substance and Sexual Activity  . Alcohol use: No    Alcohol/week: 0.0 oz  . Drug use: No  . Sexual activity: Not Currently  Other Topics Concern  . Not on file  Social History Narrative  . Not on file    Family History  Problem Relation Age of Onset  . Breast cancer Mother 61  . Heart disease Mother   . Throat cancer Father   . Hypertension Father   . Arthritis Father   . Diabetes Father   . Arthritis Sister   . Obesity Sister   . Diabetes Sister   . Heart disease Cousin   . Colon cancer Neg Hx   . Stomach cancer Neg Hx   . Esophageal cancer Neg Hx     Allergies    Allergen Reactions  . Sulfonamide Derivatives Swelling    Mouth swelling  . Tramadol Nausea And Vomiting  . Aricept [Donepezil Hcl]     GI upset/loose stools    Outpatient Encounter Medications as of 06/08/2017  Medication Sig  . albuterol (PROVENTIL HFA;VENTOLIN HFA) 108 (90 Base) MCG/ACT inhaler Inhale 1-2 puffs into the lungs every 6 (six) hours as needed for wheezing or shortness of breath.  . anastrozole (ARIMIDEX) 1 MG tablet Take  1 tablet (1 mg total) by mouth daily.  . budesonide-formoterol (SYMBICORT) 160-4.5 MCG/ACT inhaler Inhale 2 puffs into the lungs 2 (two) times daily. For bronchitis  . cetirizine (ZYRTEC) 10 MG tablet Take 10 mg by mouth at bedtime.  . docusate sodium (COLACE) 100 MG capsule Take 100 mg by mouth daily as needed for mild constipation. TAKE PER BOTTLE AS NEEDED FOR CONSTIPATION   . DULoxetine (CYMBALTA) 60 MG capsule TAKE 1 CAPSULE BY MOUTH EVERY DAY FOR anxiety  . estradiol (ESTRACE) 0.1 MG/GM vaginal cream Use every other evening use in vaginal area  . hydrOXYzine (ATARAX/VISTARIL) 10 MG tablet Take 1 tablet (10 mg total) by mouth 3 (three) times daily as needed for itching.  . insulin aspart (NOVOLOG FLEXPEN) 100 UNIT/ML FlexPen Inject 34 Units into the skin 2 (two) times daily.  . Insulin Glargine (TOUJEO SOLOSTAR) 300 UNIT/ML SOPN Inject 70 Units into the skin 2 (two) times daily. (before breakfast and before supper)  . isosorbide mononitrate (IMDUR) 60 MG 24 hr tablet TAKE 1 TABLET BY MOUTH EVERY DAY  . Lancets (ONETOUCH ULTRASOFT) lancets Check blood sugar 2-3 times daily as directed  . losartan (COZAAR) 25 MG tablet TAKE 1 TABLET BY MOUTH EVERY DAY  . meclizine (ANTIVERT) 25 MG tablet Take 1 tablet (25 mg total) by mouth 3 (three) times daily as needed for dizziness.  . memantine (NAMENDA) 5 MG tablet Take 2 tabs po every AM and 1 tab po every PM for memory  . metoprolol succinate (TOPROL-XL) 25 MG 24 hr tablet Take one tablet by mouth once daily for  blood pressure  . nitroGLYCERIN (NITROSTAT) 0.4 MG SL tablet Take one tablet under the tongue every 5 minutes as needed for chest pain  . ONETOUCH VERIO test strip Use to test blood sugar three times daily. Dx: E11.00  . oxyCODONE-acetaminophen (PERCOCET) 10-325 MG tablet Take 1 tablet by mouth every 6 (six) hours.  . pantoprazole (PROTONIX) 40 MG tablet Take 1 tablet (40 mg total) by mouth daily.  . pravastatin (PRAVACHOL) 20 MG tablet Take one tablet by mouth once daily for cholesterol  . sodium chloride (OCEAN) 0.65 % SOLN nasal spray Place 1 spray into both nostrils 3 (three) times daily as needed (for sinus congestion.). RINSE AS NEEDED FOR SINUS CONGESTION   . Spacer/Aero-Holding Chambers (AEROCHAMBER PLUS FLO-VU LARGE) MISC 1 each by Other route once.  [name] ULORIC 40 MG tablet TAKE 1 TABLET BY MOUTH ONCE daily  . warfarin (COUMADIN) 5 MG tablet Take 1 tablet daily except 1 & 1/2 tablets on Tuesdays and Thursdays  . [DISCONTINUED] memantine (NAMENDA) 5 MG tablet Take 1 tablet (5 mg total) by mouth 2 (two) times daily.  . clonazePAM (KLONOPIN) 1 MG tablet Take 2 tablets (2 mg total) by mouth at bedtime. For anxiety   No facility-administered encounter medications on file as of 06/08/2017.     Review of Systems:  Review of Systems  Unable to perform ROS: Other (memory loss)    Health Maintenance  Topic Date Due  . OPHTHALMOLOGY EXAM  04/25/2017  . HEMOGLOBIN A1C  07/16/2017  . MAMMOGRAM  08/16/2017  . FOOT EXAM  12/12/2017  . TETANUS/TDAP  05/08/2018  . INFLUENZA VACCINE  Completed  . DEXA SCAN  Completed  . PNA vac Low Risk Adult  Completed    Physical Exam: Vitals:   06/08/17 0927  BP: 128/80  Pulse: 84  Temp: 98.2 F (36.8 C)  TempSrc: Oral  SpO2: 99%  Weight: [name])  345 lb (156.5 kg)   Body mass index is 54.03 kg/m. Physical Exam  Constitutional: She is oriented to person, place, and time. She appears well-developed and well-nourished.  HENT:  Mouth/Throat: Oropharynx  is clear and moist. No oropharyngeal exudate.  MMM; no oral thrush  Eyes: Pupils are equal, round, and reactive to light. No scleral icterus.  Neck: Neck supple. Carotid bruit is not present. No tracheal deviation present. No thyromegaly present.  Cardiovascular: Normal rate, regular rhythm and intact distal pulses. Exam reveals no gallop and no friction rub.  Murmur (1/6 SEM) heard. Left calf palpable cord vs varicose veins. No right calf cord. Trace LE edema b/l  Pulmonary/Chest: Effort normal and breath sounds normal. No stridor. No respiratory distress. She has no wheezes. She has no rales.  Abdominal: Soft. Normal appearance and bowel sounds are normal. She exhibits no distension and no mass. There is no hepatomegaly. There is no tenderness. There is no rigidity, no rebound and no guarding. No hernia.  obese  Musculoskeletal: She exhibits edema.  Lymphadenopathy:    She has no cervical adenopathy.  Neurological: She is alert and oriented to person, place, and time. She has normal reflexes.  Skin: Skin is warm and dry. No rash noted.  Psychiatric: Her behavior is normal. Thought content normal. Cognition and memory are impaired. She exhibits a depressed mood.    Labs reviewed: Basic Metabolic Panel: Recent Labs    08/02/16 1551  09/25/16 1133 10/13/16 1237 01/16/17 1516 03/21/17 1351  NA  --    < > 135 131* 137 139  K  --    < > 4.9 4.7 4.4 4.7  CL  --   --  99 97* 105  --   CO2  --    < > '23 26 25 24  ' GLUCOSE  --    < > 285* 408* 44* 208*  BUN  --    < > 19 20 27* 28.5*  CREATININE  --    < > 1.31* 1.25* 1.52* 1.4*  CALCIUM  --    < > 8.8 8.7 8.7 9.1  TSH 1.52  --   --   --   --   --    < > = values in this interval not displayed.   Liver Function Tests: Recent Labs    07/27/16 1027 09/20/16 0923 01/16/17 1516 03/21/17 1351  AST '14 11 15 12  ' ALT '9 12 9 10  ' ALKPHOS 84 92  --  77  BILITOT 0.3 0.44 0.3 0.30  PROT 7.5 7.5 7.4 7.9  ALBUMIN 3.3* 2.8*  --  2.8*   No  results for input(s): LIPASE, AMYLASE in the last 8760 hours. No results for input(s): AMMONIA in the last 8760 hours. CBC: Recent Labs    08/11/16 1130 09/20/16 0923 03/21/17 1351  WBC 13.5* 10.7* 14.2*  NEUTROABS 8,910* 6.9* 10.8*  HGB 9.3* 8.9* 8.7*  HCT 29.7* 29.1* 28.9*  MCV 84.4 87.1 84.3  PLT 356 282 295   Lipid Panel: Recent Labs    06/23/16 1455 10/13/16 1230 01/16/17 1516  CHOL 102 99 114  HDL 36* 34* 38*  LDLCALC 47 41  --   TRIG 95 118 149  CHOLHDL 2.8 2.9 3.0   Lab Results  Component Value Date   HGBA1C 9.6 (H) 01/16/2017    Procedures since last visit: No results found.  Assessment/Plan   ICD-10-CM   1. Anxiety associated with depression F41.8 clonazePAM (KLONOPIN) 1 MG tablet  2. Memory loss R41.3 memantine (NAMENDA) 5 MG tablet  3. Other chronic pulmonary embolism without acute cor pulmonale (HCC) I27.82 VAS [place] LOWER EXTREMITY VENOUS (DVT)  4. Uncontrolled type 2 diabetes mellitus with diabetic nephropathy, with long-term current use of insulin (HCC) E11.21 Hemoglobin A1c   E11.65 Lipid Panel   Z79.4 ALT    BMP with eGFR  5. Mixed hyperlipidemia E78.2   6. Severe episode of recurrent major depressive disorder, without psychotic features ([place]) F33.2   7. History of deep venous thrombosis Z86.718 VAS [place] LOWER EXTREMITY VENOUS (DVT)    START CLONAZEPAM 2 TABLETS AT BEDTIME FOR ANXIETY RELATED TO DEPRESSION  INCREASE NAMENDA TO 2 TABS IN MORNING AND 1 TAB IN EVENING FOR MEMORY LOSS  Continue other medications as ordered  Will call with lab results  Follow up with specialists as scheduled  Follow up with coumadin nurse as scheduled  Will call with vascular imaging appt  Follow up in 3 mos for DM, hx dvt/PE, morbid obesity, MDD/anxiety  Cordella Register. Perlie Gold  [facility] [address] [place], [place] [address] [phone] Cell (Monday-Friday 8 AM - 5 PM) [phone] After 5 PM and  follow prompts

## 2017-06-08 NOTE — Patient Instructions (Signed)
START CLONAZEPAM 2 TABLETS AT BEDTIME FOR ANXIETY RELATED TO DEPRESSION  INCREASE NAMENDA TO 2 TABS IN MORNING AND 1 TAB IN EVENING FOR MEMORY LOSS  Continue other medications as ordered  Will call with lab results  Follow up with specialists as scheduled  Follow up with coumadin nurse as scheduled  Will call with vascular imaging appt  Follow up in 3 mos for DM, hx dvt/PE, morbid obesity, MDD/anxiety

## 2017-06-09 LAB — BASIC METABOLIC PANEL WITH GFR
BUN/Creatinine Ratio: 21 (calc) (ref 6–22)
BUN: 30 mg/dL — ABNORMAL HIGH (ref 7–25)
CO2: 25 mmol/L (ref 20–32)
Calcium: 8.6 mg/dL (ref 8.6–10.4)
Chloride: 108 mmol/L (ref 98–110)
Creat: 1.44 mg/dL — ABNORMAL HIGH (ref 0.60–0.88)
GFR, Est African American: 40 mL/min/{1.73_m2} — ABNORMAL LOW (ref >=60)
GFR, Est Non African American: 34 mL/min/{1.73_m2} — ABNORMAL LOW (ref >=60)
Glucose, Bld: 169 mg/dL — ABNORMAL HIGH (ref 65–139)
Potassium: 5.6 mmol/L — ABNORMAL HIGH (ref 3.5–5.3)
Sodium: 137 mmol/L (ref 135–146)

## 2017-06-09 LAB — HEMOGLOBIN A1C
Hgb A1c MFr Bld: 7.8 % of total Hgb — ABNORMAL HIGH (ref <5.7)
Mean Plasma Glucose: 177 (calc)
eAG (mmol/L): 9.8 (calc)

## 2017-06-09 LAB — LIPID PANEL
Cholesterol: 92 mg/dL (ref <200)
HDL: 38 mg/dL — ABNORMAL LOW (ref >50)
LDL Cholesterol (Calc): 37 mg/dL (calc)
Non-HDL Cholesterol (Calc): 54 mg/dL (calc) (ref <130)
Total CHOL/HDL Ratio: 2.4 (calc) (ref <5.0)
Triglycerides: 89 mg/dL (ref <150)

## 2017-06-09 LAB — ALT: ALT: 9 U/L (ref 6–29)

## 2017-06-11 ENCOUNTER — Other Ambulatory Visit: Payer: Self-pay | Admitting: *Deleted

## 2017-06-11 ENCOUNTER — Telehealth: Payer: Self-pay

## 2017-06-11 ENCOUNTER — Telehealth: Payer: Self-pay | Admitting: *Deleted

## 2017-06-11 ENCOUNTER — Ambulatory Visit (HOSPITAL_COMMUNITY)
Admission: RE | Admit: 2017-06-11 | Discharge: 2017-06-11 | Disposition: A | Discharge status: Home / Self Care | Payer: PPO | Source: Ambulatory Visit | Attending: Surgery | Admitting: Surgery

## 2017-06-11 DIAGNOSIS — I2782 Chronic pulmonary embolism: Secondary | ICD-10-CM

## 2017-06-11 DIAGNOSIS — Z86711 Personal history of pulmonary embolism: Secondary | ICD-10-CM | POA: Diagnosis not present

## 2017-06-11 DIAGNOSIS — Z09 Encounter for follow-up examination after completed treatment for conditions other than malignant neoplasm: Secondary | ICD-10-CM | POA: Diagnosis not present

## 2017-06-11 DIAGNOSIS — Z86718 Personal history of other venous thrombosis and embolism: Secondary | ICD-10-CM | POA: Insufficient documentation

## 2017-06-11 DIAGNOSIS — E875 Hyperkalemia: Secondary | ICD-10-CM

## 2017-06-11 MED ORDER — SODIUM POLYSTYRENE SULFONATE 15 GM/60ML PO SUSP: 30.0000 g | Freq: Once | ORAL | 0 refills | Status: DC

## 2017-06-11 NOTE — Telephone Encounter (Signed)
[facility] called requesting verbal orders for PT 2x4wks.  Verbal orders given.

## 2017-06-11 NOTE — Telephone Encounter (Signed)
Discussed results with patient, patient verbalized understanding of results  RX for Kayexalate sent to pharmacy. BMP order placed to recheck on Thursday 06/14/17. Copy of labs mailed to patient

## 2017-06-11 NOTE — Telephone Encounter (Signed)
Noted - I reviewed her chart and she will require lifelong anticoagulation due to hx DVT and PE. She is a good candidate for xeralto 10mg  daily in lieu of coumadin. xeralto does not require INR check. We can start it as long as her INR < 3. She has reduced kidney function but function is > 30. Let me know either way

## 2017-06-11 NOTE — Telephone Encounter (Signed)
Vascular called with prelim results from bilat doppler. Neg for DVT. Results will be in Epic once read by MD. I advised patient could leave and we would call with further instructions.

## 2017-06-11 NOTE — Telephone Encounter (Signed)
-----   Message from [place], [place] sent at 06/10/2017  4:25 PM EST ----- Diabetes control is improved; reduced kidney function but stable; potassium is a little elevated - take kayexalate 30gm po x 1 dose; no RF; cholesterol stable; continue other medications as ordered; follow up as scheduled; repeat bmp in 3 days (hyperkalemia)

## 2017-06-12 ENCOUNTER — Other Ambulatory Visit: Payer: Self-pay | Admitting: Internal Medicine

## 2017-06-12 DIAGNOSIS — E1121 Type 2 diabetes mellitus with diabetic nephropathy: Secondary | ICD-10-CM | POA: Diagnosis not present

## 2017-06-12 DIAGNOSIS — M353 Polymyalgia rheumatica: Secondary | ICD-10-CM | POA: Diagnosis not present

## 2017-06-12 DIAGNOSIS — I5032 Chronic diastolic (congestive) heart failure: Secondary | ICD-10-CM | POA: Diagnosis not present

## 2017-06-12 DIAGNOSIS — F329 Major depressive disorder, single episode, unspecified: Secondary | ICD-10-CM | POA: Diagnosis not present

## 2017-06-12 DIAGNOSIS — I2510 Atherosclerotic heart disease of native coronary artery without angina pectoris: Secondary | ICD-10-CM | POA: Diagnosis not present

## 2017-06-12 DIAGNOSIS — G8929 Other chronic pain: Secondary | ICD-10-CM | POA: Diagnosis not present

## 2017-06-12 DIAGNOSIS — M109 Gout, unspecified: Secondary | ICD-10-CM | POA: Diagnosis not present

## 2017-06-12 DIAGNOSIS — M1991 Primary osteoarthritis, unspecified site: Secondary | ICD-10-CM | POA: Diagnosis not present

## 2017-06-12 DIAGNOSIS — J449 Chronic obstructive pulmonary disease, unspecified: Secondary | ICD-10-CM | POA: Diagnosis not present

## 2017-06-12 DIAGNOSIS — I110 Hypertensive heart disease with heart failure: Secondary | ICD-10-CM | POA: Diagnosis not present

## 2017-06-12 DIAGNOSIS — I252 Old myocardial infarction: Secondary | ICD-10-CM | POA: Diagnosis not present

## 2017-06-12 DIAGNOSIS — F0390 Unspecified dementia without behavioral disturbance: Secondary | ICD-10-CM | POA: Diagnosis not present

## 2017-06-12 DIAGNOSIS — F419 Anxiety disorder, unspecified: Secondary | ICD-10-CM | POA: Diagnosis not present

## 2017-06-12 MED ORDER — RIVAROXABAN 10 MG PO TABS: 10.0000 mg | ORAL_TABLET | Freq: Every day | ORAL | 11 refills | Status: DC

## 2017-06-12 NOTE — Telephone Encounter (Signed)
Chrae- I'm forwarding this to you. I think you're working with Dr. Eulas Post today. Thanks!

## 2017-06-12 NOTE — Telephone Encounter (Signed)
I am not working with Dr.Carter today yet I will call patient after 9 am to relay Dr.Carter's recommendations

## 2017-06-12 NOTE — Telephone Encounter (Signed)
Ok to fill?    Not on medlist-

## 2017-06-12 NOTE — Telephone Encounter (Signed)
Spoke with patient, patient would like to try Xeralto. Patient would like rx sent to [facility].  Patient questions if she should keep appointment with Oretha Ellis on 06/18/17 or cancel.  PT/INR Results  05/21/17: 2.6 12.17/18: 1.5 04/02/17: 4.1  Please advise and approve rx to be sent to pharmacy

## 2017-06-12 NOTE — Telephone Encounter (Signed)
Gildardo Cranker, DO  [place], [place]        She may keep coumadin appt as scheduled. If INR < 3, she can start xeralto and stop coumadin on that day    Patient aware of Dr.Carter's response.  Patient to continue coumadin until she sees [place] and if INR less than 3 patient to start [facility]

## 2017-06-12 NOTE — Telephone Encounter (Signed)
Please advise if patient to cancel appointment with Oretha Ellis for 06/18/17

## 2017-06-12 NOTE — Addendum Note (Signed)
Addended by: Logan Bores on: 06/12/2017 11:33 AM   Modules accepted: Orders

## 2017-06-13 DIAGNOSIS — E113393 Type 2 diabetes mellitus with moderate nonproliferative diabetic retinopathy without macular edema, bilateral: Secondary | ICD-10-CM | POA: Diagnosis not present

## 2017-06-13 DIAGNOSIS — Z961 Presence of intraocular lens: Secondary | ICD-10-CM | POA: Diagnosis not present

## 2017-06-13 DIAGNOSIS — H35373 Puckering of macula, bilateral: Secondary | ICD-10-CM | POA: Diagnosis not present

## 2017-06-14 ENCOUNTER — Encounter (HOSPITAL_COMMUNITY): Payer: Self-pay | Admitting: Emergency Medicine

## 2017-06-14 ENCOUNTER — Emergency Department (HOSPITAL_COMMUNITY)
Admission: EM | Admit: 2017-06-14 | Discharge: 2017-06-14 | Disposition: A | Discharge status: Home / Self Care | Payer: PPO | Attending: Emergency Medicine | Admitting: Emergency Medicine

## 2017-06-14 ENCOUNTER — Other Ambulatory Visit: Payer: PPO

## 2017-06-14 DIAGNOSIS — I10 Essential (primary) hypertension: Secondary | ICD-10-CM | POA: Insufficient documentation

## 2017-06-14 DIAGNOSIS — Y929 Unspecified place or not applicable: Secondary | ICD-10-CM | POA: Insufficient documentation

## 2017-06-14 DIAGNOSIS — S0081XA Abrasion of other part of head, initial encounter: Secondary | ICD-10-CM | POA: Diagnosis not present

## 2017-06-14 DIAGNOSIS — Y999 Unspecified external cause status: Secondary | ICD-10-CM | POA: Insufficient documentation

## 2017-06-14 DIAGNOSIS — I2510 Atherosclerotic heart disease of native coronary artery without angina pectoris: Secondary | ICD-10-CM | POA: Insufficient documentation

## 2017-06-14 DIAGNOSIS — Z79899 Other long term (current) drug therapy: Secondary | ICD-10-CM | POA: Insufficient documentation

## 2017-06-14 DIAGNOSIS — Z794 Long term (current) use of insulin: Secondary | ICD-10-CM | POA: Insufficient documentation

## 2017-06-14 DIAGNOSIS — E1121 Type 2 diabetes mellitus with diabetic nephropathy: Secondary | ICD-10-CM

## 2017-06-14 DIAGNOSIS — E119 Type 2 diabetes mellitus without complications: Secondary | ICD-10-CM | POA: Insufficient documentation

## 2017-06-14 DIAGNOSIS — E1165 Type 2 diabetes mellitus with hyperglycemia: Secondary | ICD-10-CM | POA: Diagnosis not present

## 2017-06-14 DIAGNOSIS — IMO0002 Reserved for concepts with insufficient information to code with codable children: Secondary | ICD-10-CM

## 2017-06-14 DIAGNOSIS — S0993XA Unspecified injury of face, initial encounter: Secondary | ICD-10-CM | POA: Diagnosis present

## 2017-06-14 DIAGNOSIS — E875 Hyperkalemia: Secondary | ICD-10-CM

## 2017-06-14 DIAGNOSIS — W231XXA Caught, crushed, jammed, or pinched between stationary objects, initial encounter: Secondary | ICD-10-CM | POA: Diagnosis not present

## 2017-06-14 DIAGNOSIS — Y93E8 Activity, other personal hygiene: Secondary | ICD-10-CM | POA: Insufficient documentation

## 2017-06-14 DIAGNOSIS — T148XXA Other injury of unspecified body region, initial encounter: Secondary | ICD-10-CM

## 2017-06-14 MED ORDER — LIDOCAINE-EPINEPHRINE-TETRACAINE (LET) SOLUTION
3.0000 mL | Freq: Once | NASAL | Status: AC
  Administered 2017-06-14: 3 mL via TOPICAL
  Filled 2017-06-14: qty 3

## 2017-06-14 NOTE — ED Provider Notes (Signed)
[facility] EMERGENCY DEPARTMENT Provider Note   CSN: [id number] Arrival date & time: 06/14/17  2233     History   Chief Complaint Chief Complaint  Patient presents with  . Nail trimmer stuck on face    HPI OASIS GOEHRING is a 83 y.o. female who presents for evaluation of foreign body to the face.  Patient reports that she had an electric nail filer that she thought was a razor.  She states that tonight prior to ED arrival, she used the nail trimmer to shave her face.  She states that the nail trimmer grabbed a piece of her skin and she could not remove it.  EMS attempted to remove at home but cannot get it off.  Patient denies any difficulty breathing  The history is provided by the patient.    Past Medical History:  Diagnosis Date  . Allergy    takes Mucinex daily as needed  . Anemia, unspecified   . Anxiety    takes Clonazepam daily as needed  . Arthritis   . CHF (congestive heart failure) (HCC)    takes Furosemide daily  . Coronary artery disease    a. s/p IMI 2004 tx with BMS to RCA;  b. s/p Promus DES to RCA 2/12 (cath: LM 20-30%, pLAD 20-30%, mLAD 50%, RI 30%, pRCA 60%, mRCA 90% - tx with PCI);  c. myoview 1/07: EF 49%, inf scar, no isch  . Coronary atherosclerosis of native coronary artery   . Depression    takes Cymbalta daily  . Diabetes mellitus    insulin daily  . Dysuria   . GERD (gastroesophageal reflux disease)    takes Protonix daily  . Gout, unspecified    takes Colchicine daily  . Headache    occasionally  . History of blood clots about 30yrs ago   in legs-takes Coumadin   . Hyperlipidemia    takes Pravastatin daily  . Hypertension    takes Imdur and Metoprolol daily  . Insomnia   . Joint pain   . Joint swelling   . Myocardial infarction ([place]) 2004  . Numbness   . Obstructive sleep apnea   . Osteoarthritis   . Osteoarthritis   . Osteoarthrosis, unspecified whether generalized or localized, lower leg   . Pain, chronic   .  Polymyalgia rheumatica ([place])   . Pulmonary emboli ([place]) 9/13   felt to need lifelong anticoagulation  . Shortness of breath dyspnea    with exertion and has Albuterol inhaler prn  . Type II or unspecified type diabetes mellitus without mention of complication, uncontrolled   . Urinary incontinence    takes Linzess daily  . Vertigo    hx of;was taking Meclizine if needed    Patient Active Problem List   Diagnosis Date Noted  . Anxiety associated with depression 06/08/2017  . GERD with esophagitis 11/15/2016  . Memory loss 11/15/2016  . Multiple benign nevi 11/15/2016  . Esophageal dysphagia   . Severe episode of recurrent major depressive disorder, without psychotic features ([place]) 06/23/2016  . Endometrial polyp 06/23/2016  . Vaginitis and vulvovaginitis 06/23/2016  . Cough variant asthma 11/17/2015  . Vitamin D deficiency 08/02/2015  . Breast cancer ([place]) 04/26/2015  . Dyslipidemia associated with type 2 diabetes mellitus ([place]) 02/06/2015  . Uncontrolled type 2 diabetes mellitus with diabetic nephropathy, with long-term current use of insulin ([place]) 02/06/2015  . Chronic pain syndrome 02/06/2015  . Morbid obesity with BMI of 50.0-59.9, adult ([place]) 12/18/2014  .  CKD (chronic kidney disease) stage 3, GFR 30-59 ml/min (HCC) 12/18/2014  . Gout 10/27/2014  . Breast mass 10/27/2014  . Encounter for therapeutic drug monitoring 10/08/2014  . Vertigo 09/24/2014  . Chronic bronchitis ([place]) 09/23/2014  . Preoperative clearance 08/16/2014  . Abnormal stress test   . DOE (dyspnea on exertion) 06/24/2014  . CN (constipation) 05/10/2014  . History of pulmonary embolus (PE) 04/22/2014  . Sepsis secondary to UTI ([place]) 04/21/2014  . COPD (chronic obstructive pulmonary disease) ([place]) 04/21/2014  . Estrogen deficiency 02/19/2014  . Breast cancer screening 02/19/2014  . Weight gain 02/19/2014  . Pain in the chest 12/31/2013  . Type 2 diabetes mellitus with diabetic foot deformity ([place])  07/29/2013  . OSA (obstructive sleep apnea) 02/24/2013  . Need for prophylactic vaccination and inoculation against influenza 01/08/2013  . Insomnia 09/17/2012  . Debility 08/01/2012  . Osteoarthritis   . Dysphagia, unspecified(787.20) 07/31/2012  . GERD (gastroesophageal reflux disease) 07/31/2012  . Long term current use of anticoagulant therapy 02/02/2012  . Chronic diastolic congestive heart failure ([place]) 01/29/2012  . History of deep venous thrombosis 01/27/2012  . Abdominal pain 01/26/2012  . CHOLELITHIASIS 06/06/2010  . Situational depression 06/04/2009  . ORTHOPNEA 03/09/2009  . Morbid (severe) obesity due to excess calories ([place]) 02/15/2009  . ANEMIA 02/15/2009  . POLYMYALGIA RHEUMATICA 01/18/2009  . TENSION HEADACHE 01/07/2008  . Headache 01/07/2008  . FUNGAL DERMATITIS 11/13/2007  . CAD (coronary artery disease) 08/29/2007  . URINARY INCONTINENCE 08/29/2007  . Hyperlipidemia 12/03/2006  . Essential hypertension 12/03/2006  . DIVERTICULITIS, HX OF 12/03/2006    Past Surgical History:  Procedure Laterality Date  . ABDOMINAL HYSTERECTOMY     partial  . APPENDECTOMY    . blood clots/legs and lungs  2013  . BREAST BIOPSY Left 07/22/2014  . BREAST BIOPSY Left 02/10/2013  . BREAST LUMPECTOMY Left 11/05/2014  . BREAST LUMPECTOMY WITH RADIOACTIVE SEED LOCALIZATION Left 11/05/2014   Procedure: LEFT BREAST LUMPECTOMY WITH RADIOACTIVE SEED LOCALIZATION;  Surgeon: [name], MD;  Location: [facility];  Service: General;  Laterality: Left;  . CARDIAC CATHETERIZATION    . COLONOSCOPY    . CORONARY ANGIOPLASTY  2  . ESOPHAGOGASTRODUODENOSCOPY (EGD) WITH PROPOFOL N/A 11/07/2016   Procedure: ESOPHAGOGASTRODUODENOSCOPY (EGD) WITH PROPOFOL;  Surgeon: Gatha Mayer, MD;  Location: [facility];  Service: Endoscopy;  Laterality: N/A;  . EXCISION OF SKIN TAG Right 11/05/2014   Procedure: EXCISION OF RIGHT EYELID SKIN TAG;  Surgeon: [name], MD;  Location: [facility];  Service:  General;  Laterality: Right;  . EYE SURGERY Bilateral    cataract   . GASTRIC BYPASS  1977    reversed in 1979, [place] N/A 06/29/2014   Procedure: LEFT HEART CATHETERIZATION WITH CORONARY ANGIOGRAM;  Surgeon: Troy Sine, MD;  Location: [facility] CATH LAB;  Service: Cardiovascular;  Laterality: N/A;  . MI with stent placement  2004    OB History    No data available       Home Medications    Prior to Admission medications   Medication Sig Start Date End Date Taking? Authorizing Provider  albuterol (PROVENTIL HFA;VENTOLIN HFA) 108 (90 Base) MCG/ACT inhaler Inhale 1-2 puffs into the lungs every 6 (six) hours as needed for wheezing or shortness of breath. 03/15/16   Robyn Haber, MD  anastrozole (ARIMIDEX) 1 MG tablet Take 1 tablet (1 mg total) by mouth daily. 09/20/16   [name], MD  budesonide-formoterol [facility])  160-4.5 MCG/ACT inhaler Inhale 2 puffs into the lungs 2 (two) times daily. For bronchitis 10/13/16   Gildardo Cranker, DO  cetirizine (ZYRTEC) 10 MG tablet Take 10 mg by mouth at bedtime.    [provider]  clonazePAM (KLONOPIN) 1 MG tablet Take 2 tablets (2 mg total) by mouth at bedtime. For anxiety 06/08/17   Gildardo Cranker, DO  docusate sodium (COLACE) 100 MG capsule Take 100 mg by mouth daily as needed for mild constipation. TAKE PER BOTTLE AS NEEDED FOR CONSTIPATION     [provider]  DULoxetine (CYMBALTA) 60 MG capsule TAKE 1 CAPSULE BY MOUTH EVERY DAY FOR anxiety 04/25/17   Gildardo Cranker, DO  estradiol (ESTRACE) 0.1 MG/GM vaginal cream Use every other evening use in vaginal area 02/06/17   [provider]  hydrOXYzine (ATARAX/VISTARIL) 10 MG tablet Take 1 tablet (10 mg total) by mouth 3 (three) times daily as needed for itching. 12/06/16   Gildardo Cranker, DO  insulin aspart (NOVOLOG FLEXPEN) 100 UNIT/ML FlexPen Inject 34 Units into the skin 2 (two) times daily. 02/26/17   Tivis Ringer, RPH-CPP  Insulin Glargine (TOUJEO SOLOSTAR) 300 UNIT/ML SOPN Inject 70 Units into the skin 2 (two) times daily. (before breakfast and before supper) 10/16/16   Gildardo Cranker, DO  isosorbide mononitrate (IMDUR) 60 MG 24 hr tablet TAKE 1 TABLET BY MOUTH EVERY DAY 04/24/17   Gildardo Cranker, DO  Lancets [facility] ULTRASOFT) lancets Check blood sugar 2-3 times daily as directed 11/01/15   [provider]  losartan (COZAAR) 25 MG tablet TAKE 1 TABLET BY MOUTH EVERY DAY 04/23/17   Gildardo Cranker, DO  meclizine (ANTIVERT) 25 MG tablet Take 1 tablet (25 mg total) by mouth 3 (three) times daily as needed for dizziness. 12/06/16   Gildardo Cranker, DO  memantine [facility]) 5 MG tablet Take 2 tabs po every AM and 1 tab po every PM for memory 06/08/17   Gildardo Cranker, DO  metoprolol succinate (TOPROL-XL) 25 MG 24 hr tablet Take one tablet by mouth once daily for blood pressure 04/23/17   Gildardo Cranker, DO  nitroGLYCERIN (NITROSTAT) 0.4 MG SL tablet Take one tablet under the tongue every 5 minutes as needed for chest pain 09/24/13   [name], NP  [facility] VERIO test strip Use to test blood sugar three times daily. Dx: E11.00 07/19/16   Gildardo Cranker, DO  oxyCODONE-acetaminophen (PERCOCET) 10-325 MG tablet Take 1 tablet by mouth every 6 (six) hours. 05/18/17   Gildardo Cranker, DO  pantoprazole (PROTONIX) 40 MG tablet Take 1 tablet (40 mg total) by mouth daily. 02/22/17   Gildardo Cranker, DO  pravastatin (PRAVACHOL) 20 MG tablet Take one tablet by mouth once daily for cholesterol 04/23/17   Gildardo Cranker, DO  rivaroxaban (XARELTO) 10 MG TABS tablet Take 1 tablet (10 mg total) by mouth daily. 06/12/17   Gildardo Cranker, DO  sodium chloride (OCEAN) 0.65 % SOLN nasal spray Place 1 spray into both nostrils 3 (three) times daily as needed (for sinus congestion.). RINSE AS NEEDED FOR SINUS CONGESTION     [provider]  Spacer/Aero-Holding Chambers (AEROCHAMBER PLUS FLO-VU LARGE) MISC 1 each by  Other route once. 11/04/15   Lysbeth Penner, FNP  SPS 15 GM/60ML suspension TAKE 120 MLS (30 G TOTAL) BY MOUTH ONCE FOR 1 DOSE 06/12/17   Gildardo Cranker, DO  ULORIC 40 MG tablet TAKE 1 TABLET BY MOUTH ONCE daily 03/26/17   Gildardo Cranker, DO    Family History Family History  Problem Relation Age of Onset  . Breast cancer Mother 63  . Heart disease Mother   . Throat cancer Father   . Hypertension Father   . Arthritis Father   . Diabetes Father   . Arthritis Sister   . Obesity Sister   . Diabetes Sister   . Heart disease Cousin   . Colon cancer Neg Hx   . Stomach cancer Neg Hx   . Esophageal cancer Neg Hx     Social History Social History   Tobacco Use  . Smoking status: Never Smoker  . Smokeless tobacco: Never Used  Substance Use Topics  . Alcohol use: No    Alcohol/week: 0.0 oz  . Drug use: No     Allergies   Sulfonamide derivatives; Tramadol; and Aricept [donepezil hcl]   Review of Systems Review of Systems  Respiratory: Negative for shortness of breath.   Skin: Positive for wound.     Physical Exam Updated Vital Signs BP (!) 168/90 (BP Location: Right Arm)   Pulse 80   Resp 20   SpO2 99%   Physical Exam  Constitutional: She appears well-developed and well-nourished.  HENT:  Head: Normocephalic and atraumatic.    Eyes: Conjunctivae and EOM are normal. Right eye exhibits no discharge. Left eye exhibits no discharge. No scleral icterus.  Pulmonary/Chest: Effort normal.  Neurological: She is alert.  Skin: Skin is warm and dry.  Psychiatric: She has a normal mood and affect. Her speech is normal and behavior is normal.  Nursing note and vitals reviewed.    ED Treatments / Results  Labs (all labs ordered are listed, but only abnormal results are displayed) Labs Reviewed - No data to display  EKG  EKG Interpretation None       Radiology No results found.  Procedures .Foreign Body Removal Date/Time: 06/15/2017 1:07 AM Performed by:  Volanda Napoleon, PA-C Authorized by: Volanda Napoleon, PA-C  Consent: Verbal consent obtained. Consent given by: patient Patient understanding: patient states understanding of the procedure being performed Patient consent: the patient's understanding of the procedure matches consent given Procedure consent: procedure consent matches procedure scheduled Relevant documents: relevant documents present and verified Required items: required blood products, implants, devices, and special equipment available Patient identity confirmed: verbally with patient Body area: skin General location: head/neck Location details: face  Anesthesia: Local Anesthetic: LET (lido,epi,tetracaine) Complexity: simple 1 objects recovered. Objects recovered: electric nail filer Post-procedure assessment: foreign body removed Patient tolerance: Patient tolerated the procedure well with no immediate complications Comments: LET was applied to the area with pain.  Once the let had been to the area, surgical loop was applied to the area and the electric medial collar was slid off of the skin.  No incisions were made.   (including critical care time)  Medications Ordered in ED Medications  lidocaine-EPINEPHrine-tetracaine (LET) solution (3 mLs Topical Given 06/14/17 2257)     Initial Impression / Assessment and Plan / ED Course  I have reviewed the triage vital signs and the nursing notes.  Pertinent labs & imaging results that were available during my care of the patient were reviewed by me and considered in my medical decision making (see chart for details).     83 year old female who presents for evaluation of these.  Patient reports that she was using an electric nail father that she thought was an electric shaver to change in hair.  Patient reports that the area clamp down on a piece of her skin.  She  is not able to remove the object.  She denies any difficulty breathing.  Patient appears uncomfortable but  is not in any acute distress in the ED.  There is currently a electric female father attached to a 0.5 section of the skin on her right chin.  Attempted to remove it but was unsuccessful.  We will apply let to the area.  Foreign body removed as documented above.  After removal, the area was thoroughly evaluated.  There appears to be a small abrasion.  No laceration noted that would require sutures.  Will place bacitracin on site.  Discussed wound care with patient and family.  Encourage primary care follow-up in the next 24-48 hours for further evaluation. Patient had ample opportunity for questions and discussion. All patient's questions were answered with full understanding. Strict return precautions discussed. Patient expresses understanding and agreement to plan.    Final Clinical Impressions(s) / ED Diagnoses   Final diagnoses:  Abrasion    ED Discharge Orders    None       Volanda Napoleon, PA-C 06/15/17 0109    Fatima Blank, MD 06/16/17 1136

## 2017-06-14 NOTE — ED Triage Notes (Signed)
Pt presents to ED with electric nail trimmer stuck on chin. Pt attempting to shave hair on chin, states her "meat" got stuck. EMS attempted to remove at home.

## 2017-06-14 NOTE — Discharge Instructions (Signed)
Keep the wound clean and dry. Gently clean the wound with soap and water. Make sure to pat dry the wound before covering it with any dressing. You can use topical antibiotic ointment and bandage. Ice and elevate for pain relief.   You can take Tylenol or Ibuprofen as directed for pain. You can alternate Tylenol and Ibuprofen every 4 hours for additional pain relief.   Monitor closely for any signs of infection. Return to the Emergency Department for any worsening redness/swelling of the area that begins to spread, drainage from the site, worsening pain, fever or any other worsening or concerning symptoms.

## 2017-06-15 ENCOUNTER — Other Ambulatory Visit: Payer: Self-pay

## 2017-06-15 ENCOUNTER — Telehealth: Payer: Self-pay

## 2017-06-15 LAB — BASIC METABOLIC PANEL
BUN/Creatinine Ratio: 20 (calc) (ref 6–22)
BUN: 26 mg/dL — ABNORMAL HIGH (ref 7–25)
CO2: 23 mmol/L (ref 20–32)
Calcium: 8.7 mg/dL (ref 8.6–10.4)
Chloride: 109 mmol/L (ref 98–110)
Creat: 1.3 mg/dL — ABNORMAL HIGH (ref 0.60–0.88)
Glucose, Bld: 110 mg/dL (ref 65–139)
Potassium: 4.9 mmol/L (ref 3.5–5.3)
Sodium: 141 mmol/L (ref 135–146)

## 2017-06-15 LAB — MICROALBUMIN / CREATININE URINE RATIO
Creatinine, Urine: 36 mg/dL (ref 20–275)
Microalb Creat Ratio: 81 mcg/mg creat — ABNORMAL HIGH (ref <30)
Microalb, Ur: 2.9 mg/dL

## 2017-06-15 MED ORDER — LOSARTAN POTASSIUM 50 MG PO TABS
50.0000 mg | ORAL_TABLET | Freq: Every day | ORAL | 1 refills | Status: DC
Start: 2017-06-15 — End: 2017-07-28

## 2017-06-15 NOTE — Telephone Encounter (Signed)
Discussed additional response with patient. [name] and Mrs.Azpeitia verbalized understanding. Sent new rx to pharmacy. Patient will take 2 of the 25 mg until she runs out and then start new rx.

## 2017-06-15 NOTE — Telephone Encounter (Signed)
-----   Message from [place], [place] sent at 06/15/2017  2:09 PM EST ----- Diabetic kidney disease related to blood sugar control and blood pressure control; recommend we increase losartan 50mg  #30 take 1 tab po daily with 6 rf; will reck urine micro/Cr ratio next visit

## 2017-06-18 ENCOUNTER — Telehealth: Payer: Self-pay

## 2017-06-18 ENCOUNTER — Ambulatory Visit: Payer: PPO | Admitting: Pharmacotherapy

## 2017-06-18 ENCOUNTER — Other Ambulatory Visit: Payer: Self-pay

## 2017-06-18 DIAGNOSIS — J449 Chronic obstructive pulmonary disease, unspecified: Secondary | ICD-10-CM | POA: Diagnosis not present

## 2017-06-18 DIAGNOSIS — F329 Major depressive disorder, single episode, unspecified: Secondary | ICD-10-CM | POA: Diagnosis not present

## 2017-06-18 DIAGNOSIS — E1121 Type 2 diabetes mellitus with diabetic nephropathy: Secondary | ICD-10-CM | POA: Diagnosis not present

## 2017-06-18 DIAGNOSIS — I252 Old myocardial infarction: Secondary | ICD-10-CM | POA: Diagnosis not present

## 2017-06-18 DIAGNOSIS — J410 Simple chronic bronchitis: Secondary | ICD-10-CM

## 2017-06-18 DIAGNOSIS — I5032 Chronic diastolic (congestive) heart failure: Secondary | ICD-10-CM | POA: Diagnosis not present

## 2017-06-18 DIAGNOSIS — F0390 Unspecified dementia without behavioral disturbance: Secondary | ICD-10-CM | POA: Diagnosis not present

## 2017-06-18 DIAGNOSIS — M353 Polymyalgia rheumatica: Secondary | ICD-10-CM | POA: Diagnosis not present

## 2017-06-18 DIAGNOSIS — F419 Anxiety disorder, unspecified: Secondary | ICD-10-CM | POA: Diagnosis not present

## 2017-06-18 DIAGNOSIS — I110 Hypertensive heart disease with heart failure: Secondary | ICD-10-CM | POA: Diagnosis not present

## 2017-06-18 DIAGNOSIS — I2510 Atherosclerotic heart disease of native coronary artery without angina pectoris: Secondary | ICD-10-CM | POA: Diagnosis not present

## 2017-06-18 DIAGNOSIS — M1991 Primary osteoarthritis, unspecified site: Secondary | ICD-10-CM | POA: Diagnosis not present

## 2017-06-18 DIAGNOSIS — M109 Gout, unspecified: Secondary | ICD-10-CM | POA: Diagnosis not present

## 2017-06-18 DIAGNOSIS — G8929 Other chronic pain: Secondary | ICD-10-CM | POA: Diagnosis not present

## 2017-06-18 MED ORDER — BUDESONIDE-FORMOTEROL FUMARATE 160-4.5 MCG/ACT IN AERO: 2.0000 | INHALATION_SPRAY | Freq: Two times a day (BID) | RESPIRATORY_TRACT | 6 refills | Status: DC

## 2017-06-18 MED ORDER — INSULIN ASPART 100 UNIT/ML FLEXPEN: 34.0000 [IU] | PEN_INJECTOR | Freq: Two times a day (BID) | SUBCUTANEOUS | 6 refills | Status: DC

## 2017-06-18 NOTE — Telephone Encounter (Signed)
Patient was scheduled to see Oretha Ellis today for last PT/INR check prior to switching over to Xarelto from coumadin,  Patient had misunderstood and picked up Xarelto on 06/12/17 and started taking. Patient stopped coumadin.  Per Oretha Ellis no need to check INR if patient already on Xarelto x 1 week. Appointment was cancelled.

## 2017-06-19 ENCOUNTER — Other Ambulatory Visit: Payer: Self-pay

## 2017-06-19 NOTE — Patient Outreach (Addendum)
[name]) Care Management  06/19/2017  [name] 12-10-1934 [id number]   Referral Date: 06/15/17 Referral Source: ED Census Referral Reason: Patient engagement tool score of 10.   Outreach Attempt: Spoke with patient she is able to verify HIPAA.  She reports that she is doing ok.  Patient reports that she currently has [place] working with her.     Social: Patient lives with her daughter.  She reports that her daughter provides care such as bathing, dressing, and cooking.     Conditions: Patient admits to anxiety, depression, HF, HTN, DM, COPD and some kidney disease.  Patient reports that her sugars are doing good and offers no problems.  Last A1c was down to 7.8 per chart from 9.6.  Patient denies any problems with her COPD, HTN, or heart failure.   Medications: Patient reports that she uses the pill pack for her medications and offers no questions or concerns about her medications.   Appointments: Patient saw PCP last on 06-08-17   Advanced Directives: Patient does not have an advanced directive but has the paperwork from her doctor and is working on paperwork but states she needs some assistance with finishing it. Advised patient on having social work assist her in completing it. She is agreeable.    Consent: Discussed with patient [place].  Patient is agreeable to social work to help with advanced directives.  Patient reports that [facility] is assisting her in managing her health needs at this time and declines other services.     Plan: RN CM will refer to social work to assist with completing advanced directives.   RN CM will sign off on case at this time.   Jone Baseman, RN, MSN [facility] Care Management Care Management Coordinator Direct Line [phone] Toll Free: [phone]  Fax: [phone]

## 2017-06-20 ENCOUNTER — Encounter: Payer: Self-pay | Admitting: *Deleted

## 2017-06-21 ENCOUNTER — Other Ambulatory Visit: Payer: Self-pay

## 2017-06-21 DIAGNOSIS — G894 Chronic pain syndrome: Secondary | ICD-10-CM

## 2017-06-21 NOTE — Telephone Encounter (Signed)
Patient asked if she can go back to getting medication on the 8th of each month vs the 14th.  I explained to patient that I am not certain of how this can happen for it will show as being released early and the pharmacy may not fill.   Patient would like to know if Dr.Carter will ok rx to be released on the 8th of each month and give the pharmacy an override to fill early next month to get her back on track with getting rx on the 8th vs 14th  Please advise and approve pending rx

## 2017-06-21 NOTE — Telephone Encounter (Signed)
Patient ok to have medication filled now and will stay with new schedule of getting rx on the 14th of every month.

## 2017-06-21 NOTE — Telephone Encounter (Signed)
No - I will not be overriding her med RF date. She can either not get her Rx today and wait until the 8th OR continue getting Rx around the 14th of the month

## 2017-06-22 ENCOUNTER — Other Ambulatory Visit: Payer: Self-pay | Admitting: *Deleted

## 2017-06-22 MED ORDER — OXYCODONE-ACETAMINOPHEN 10-325 MG PO TABS: 1.0000 | ORAL_TABLET | Freq: Four times a day (QID) | ORAL | 0 refills | Status: DC

## 2017-06-22 NOTE — Patient Outreach (Signed)
[facility]) Care Management  06/22/2017  [name] Aug 30, 1934 [id number]   CSW made an initial attempt to try and contact patient today to perform phone assessment, as well as assess and assist with social needs and services, without success.  A HIPAA compliant message was left for patient on voicemail.  CSW is currently awaiting a return call. CSW will make a second outreach attempt on Monday, June 25, 2017, if CSW does not receive a return call from patient in the meantime. Nat Christen, BSW, MSW, LCSW  Licensed Education officer, environmental Health System  Mailing [place] [address] [place], [place] [address] Physical Address-[address], [place], [place] [address] Toll Free Main # [phone] Fax # [phone] Cell # [phone]  Office # [phone] [email or web address]

## 2017-06-25 ENCOUNTER — Encounter: Payer: Self-pay | Admitting: *Deleted

## 2017-06-25 ENCOUNTER — Other Ambulatory Visit: Payer: Self-pay | Admitting: *Deleted

## 2017-06-25 DIAGNOSIS — G8929 Other chronic pain: Secondary | ICD-10-CM | POA: Diagnosis not present

## 2017-06-25 DIAGNOSIS — M109 Gout, unspecified: Secondary | ICD-10-CM | POA: Diagnosis not present

## 2017-06-25 DIAGNOSIS — M1991 Primary osteoarthritis, unspecified site: Secondary | ICD-10-CM | POA: Diagnosis not present

## 2017-06-25 DIAGNOSIS — F419 Anxiety disorder, unspecified: Secondary | ICD-10-CM | POA: Diagnosis not present

## 2017-06-25 DIAGNOSIS — I5032 Chronic diastolic (congestive) heart failure: Secondary | ICD-10-CM | POA: Diagnosis not present

## 2017-06-25 DIAGNOSIS — I252 Old myocardial infarction: Secondary | ICD-10-CM | POA: Diagnosis not present

## 2017-06-25 DIAGNOSIS — J449 Chronic obstructive pulmonary disease, unspecified: Secondary | ICD-10-CM | POA: Diagnosis not present

## 2017-06-25 DIAGNOSIS — I110 Hypertensive heart disease with heart failure: Secondary | ICD-10-CM | POA: Diagnosis not present

## 2017-06-25 DIAGNOSIS — M353 Polymyalgia rheumatica: Secondary | ICD-10-CM | POA: Diagnosis not present

## 2017-06-25 DIAGNOSIS — F0390 Unspecified dementia without behavioral disturbance: Secondary | ICD-10-CM | POA: Diagnosis not present

## 2017-06-25 DIAGNOSIS — E1121 Type 2 diabetes mellitus with diabetic nephropathy: Secondary | ICD-10-CM | POA: Diagnosis not present

## 2017-06-25 DIAGNOSIS — F329 Major depressive disorder, single episode, unspecified: Secondary | ICD-10-CM | POA: Diagnosis not present

## 2017-06-25 DIAGNOSIS — I2510 Atherosclerotic heart disease of native coronary artery without angina pectoris: Secondary | ICD-10-CM | POA: Diagnosis not present

## 2017-06-25 NOTE — Patient Outreach (Signed)
[facility]) Care Management  06/25/2017  [name] 07/15/1934 [id number]   CSW was able to make initial contact with patient today to perform phone assessment, as well as assess and assist with social work needs and services.  CSW introduced self, explained role and types of services provided through [place] [facility] ([place] [facility]).  CSW further explained to patient that CSW works with patient's Telephonic RNCM, also with [facility] [facility], Dionne Leath. CSW then explained the reason for the call, indicating that Mrs. [name] thought that patient would benefit from social work services and resources to assist with completion of Advanced Directives ([place] documents).  CSW obtained two HIPAA compliant identifiers from patient, which included patient's name and date of birth. Patient's daughter, [name] was present at the time of CSW's phone conversation with patient, expressing an interest in having CSW assist patient with completion of her Advanced Directives.  At the convenience of Mrs. [name], the initial home visit with patient has been scheduled for Tuesday, July 03, 2017 at 10:00 AM, at which time CSW will provide patient with an Advanced Directives packet, as well as assist with completion.  CSW will also provide patient with a list of [facility] affiliated facilities that are able to assist with free notary services.  Patient and Mrs. [name] voiced understanding and were agreeable to this plan. [facility] CM Care Plan Problem One     Most Recent Value  Care Plan Problem One  No Advanced Directives ([place] documents) in place.  Role Documenting the Problem One  Clinical Social Worker  Care Plan for Problem One  Active  [facility] Long Term Goal   Patient will have Advanced Directives in place within the next 45 days.  [facility] Long Term Goal Start Date  06/25/17  Interventions  for Problem One Long Term Goal  Patient has been encouraged to provide her PCP with a copy of the Advanced Directives for her medical record.  [facility] CM Short Term Goal #1   Patient will complete her Advanced Directives with the assistance of CSW, within the next two weeks.  [facility] CM Short Term Goal #1 Start Date  06/25/17  Interventions for Short Term Goal #1  CSW has scheduled an initial home visit with patient to assist with completion of Advanced Directives.  [facility] CM Short Term Goal #2   Patient will have her Advanced Directives notarized through a [place] affiliated facility within the next 30 days.  [facility] CM Short Term Goal #2 Start Date  06/25/17  Interventions for Short Term Goal #2  Patient has been given a list of [place] facilities that offer free notary services.    Nat Christen, BSW, MSW, LCSW  Licensed Education officer, environmental Health System  Mailing [place] [address], [place], [place] [address] Physical Address-[address], [place], [place] [address] Toll Free Main # [phone] Fax # [phone] Cell # [phone]  Office # [phone] [email or web address]

## 2017-06-27 DIAGNOSIS — E1121 Type 2 diabetes mellitus with diabetic nephropathy: Secondary | ICD-10-CM | POA: Diagnosis not present

## 2017-06-27 DIAGNOSIS — M353 Polymyalgia rheumatica: Secondary | ICD-10-CM | POA: Diagnosis not present

## 2017-06-27 DIAGNOSIS — G8929 Other chronic pain: Secondary | ICD-10-CM | POA: Diagnosis not present

## 2017-06-27 DIAGNOSIS — I5032 Chronic diastolic (congestive) heart failure: Secondary | ICD-10-CM | POA: Diagnosis not present

## 2017-06-27 DIAGNOSIS — F0390 Unspecified dementia without behavioral disturbance: Secondary | ICD-10-CM | POA: Diagnosis not present

## 2017-06-27 DIAGNOSIS — I110 Hypertensive heart disease with heart failure: Secondary | ICD-10-CM | POA: Diagnosis not present

## 2017-06-27 DIAGNOSIS — F419 Anxiety disorder, unspecified: Secondary | ICD-10-CM | POA: Diagnosis not present

## 2017-06-27 DIAGNOSIS — I252 Old myocardial infarction: Secondary | ICD-10-CM | POA: Diagnosis not present

## 2017-06-27 DIAGNOSIS — J449 Chronic obstructive pulmonary disease, unspecified: Secondary | ICD-10-CM | POA: Diagnosis not present

## 2017-06-27 DIAGNOSIS — M1991 Primary osteoarthritis, unspecified site: Secondary | ICD-10-CM | POA: Diagnosis not present

## 2017-06-27 DIAGNOSIS — F329 Major depressive disorder, single episode, unspecified: Secondary | ICD-10-CM | POA: Diagnosis not present

## 2017-06-27 DIAGNOSIS — M109 Gout, unspecified: Secondary | ICD-10-CM | POA: Diagnosis not present

## 2017-06-27 DIAGNOSIS — I2510 Atherosclerotic heart disease of native coronary artery without angina pectoris: Secondary | ICD-10-CM | POA: Diagnosis not present

## 2017-06-29 ENCOUNTER — Encounter: Payer: Self-pay | Admitting: Internal Medicine

## 2017-06-29 ENCOUNTER — Ambulatory Visit (INDEPENDENT_AMBULATORY_CARE_PROVIDER_SITE_OTHER): Payer: PPO | Admitting: Internal Medicine

## 2017-06-29 VITALS — BP 136/88 | HR 83 | Temp 97.7°F | Resp 10

## 2017-06-29 DIAGNOSIS — N763 Subacute and chronic vulvitis: Secondary | ICD-10-CM

## 2017-06-29 DIAGNOSIS — R8290 Unspecified abnormal findings in urine: Secondary | ICD-10-CM

## 2017-06-29 DIAGNOSIS — R300 Dysuria: Secondary | ICD-10-CM

## 2017-06-29 DIAGNOSIS — N840 Polyp of corpus uteri: Secondary | ICD-10-CM

## 2017-06-29 LAB — POCT URINALYSIS DIPSTICK
Bilirubin, UA: NEGATIVE
Blood, UA: NEGATIVE
Glucose, UA: NEGATIVE
Ketones, UA: NEGATIVE
Nitrite, UA: NEGATIVE
Protein, UA: NEGATIVE
Spec Grav, UA: 1.01 (ref 1.010–1.025)
Urobilinogen, UA: 0.2 E.U./dL
pH, UA: 6.5 (ref 5.0–8.0)

## 2017-06-29 NOTE — Progress Notes (Signed)
Patient ID: [name], female   DOB: Nov 24, 1934, 83 y.o. MRN: [id number]   [facility] OFFICE  Provider: DR Arletha Grippe  Code Status:  Goals of Care:  Advanced Directives 06/25/2017  Does Patient Have a Medical Advance Directive? No  Type of Advance Directive -  Does patient want to make changes to medical advance directive? -  Copy of [place] in Chart? -  Would patient like information on creating a medical advance directive? Yes (MAU/Ambulatory/Procedural Areas - Information given)  Pre-existing out of facility DNR order (yellow form or pink MOST form) -     Chief Complaint  Patient presents with  . Acute Visit    Possible UTI, patient c/o painful urination, urine odor and pain in bottom of stomach x 2 weeks   . Medication Refill    No refills needed     HPI: Patient is a 83 y.o. female seen today for an acute visit for dysuria x several days. She has noticed urine is malodorous at times. She has intermittent dysuria. No N/V, f/c. No abdominal pain. No hematuria. She has a dark vaginal d/c. She has GYN in the past and was told she had an endometrial polyp but was unable to proceed with sx due to cardiac issues. reviewed Dr Kelby Fam last note in 2017. She had chronic vulvitis, postmenopausal bleeding and an endometrial poylp. She is a poor historian due to dementia. Hx obtained from chart.  Past Medical History:  Diagnosis Date  . Allergy    takes Mucinex daily as needed  . Anemia, unspecified   . Anxiety    takes Clonazepam daily as needed  . Arthritis   . CHF (congestive heart failure) (HCC)    takes Furosemide daily  . Coronary artery disease    a. s/p IMI 2004 tx with BMS to RCA;  b. s/p Promus DES to RCA 2/12 (cath: LM 20-30%, pLAD 20-30%, mLAD 50%, RI 30%, pRCA 60%, mRCA 90% - tx with PCI);  c. myoview 1/07: EF 49%, inf scar, no isch  . Coronary atherosclerosis of native coronary artery   . Depression    takes Cymbalta daily  . Diabetes mellitus     insulin daily  . Dysuria   . GERD (gastroesophageal reflux disease)    takes Protonix daily  . Gout, unspecified    takes Colchicine daily  . Headache    occasionally  . History of blood clots about 46yrs ago   in legs-takes Coumadin   . Hyperlipidemia    takes Pravastatin daily  . Hypertension    takes Imdur and Metoprolol daily  . Insomnia   . Joint pain   . Joint swelling   . Myocardial infarction ([place]) 2004  . Numbness   . Obstructive sleep apnea   . Osteoarthritis   . Osteoarthritis   . Osteoarthrosis, unspecified whether generalized or localized, lower leg   . Pain, chronic   . Polymyalgia rheumatica ([place])   . Pulmonary emboli ([place]) 9/13   felt to need lifelong anticoagulation  . Shortness of breath dyspnea    with exertion and has Albuterol inhaler prn  . Type II or unspecified type diabetes mellitus without mention of complication, uncontrolled   . Urinary incontinence    takes Linzess daily  . Vertigo    hx of;was taking Meclizine if needed    Past Surgical History:  Procedure Laterality Date  . ABDOMINAL HYSTERECTOMY     partial  . APPENDECTOMY    .  blood clots/legs and lungs  2013  . BREAST BIOPSY Left 07/22/2014  . BREAST BIOPSY Left 02/10/2013  . BREAST LUMPECTOMY Left 11/05/2014  . BREAST LUMPECTOMY WITH RADIOACTIVE SEED LOCALIZATION Left 11/05/2014   Procedure: LEFT BREAST LUMPECTOMY WITH RADIOACTIVE SEED LOCALIZATION;  Surgeon: [name], MD;  Location: [place];  Service: General;  Laterality: Left;  . CARDIAC CATHETERIZATION    . COLONOSCOPY    . CORONARY ANGIOPLASTY  2  . ESOPHAGOGASTRODUODENOSCOPY (EGD) WITH PROPOFOL N/A 11/07/2016   Procedure: ESOPHAGOGASTRODUODENOSCOPY (EGD) WITH PROPOFOL;  Surgeon: Gatha Mayer, MD;  Location: [facility];  Service: Endoscopy;  Laterality: N/A;  . EXCISION OF SKIN TAG Right 11/05/2014   Procedure: EXCISION OF RIGHT EYELID SKIN TAG;  Surgeon: [name], MD;  Location: [place];  Service: General;   Laterality: Right;  . EYE SURGERY Bilateral    cataract   . GASTRIC BYPASS  1977    reversed in 1979, [place] N/A 06/29/2014   Procedure: LEFT HEART CATHETERIZATION WITH CORONARY ANGIOGRAM;  Surgeon: Troy Sine, MD;  [facility];  Service: Cardiovascular;  Laterality: N/A;  . MI with stent placement  2004     reports that  has never smoked. she has never used smokeless tobacco. She reports that she does not drink alcohol or use drugs. Social History   Socioeconomic History  . Marital status: Widowed    Spouse name: Not on file  . Number of children: Not on file  . Years of education: Not on file  . Highest education level: Not on file  Social Needs  . Financial resource strain: Not hard at all  . Food insecurity - worry: Never true  . Food insecurity - inability: Never true  . Transportation needs - medical: Yes  . Transportation needs - non-medical: Yes  Occupational History  . Occupation: retired  Tobacco Use  . Smoking status: Never Smoker  . Smokeless tobacco: Never Used  Substance and Sexual Activity  . Alcohol use: No    Alcohol/week: 0.0 oz  . Drug use: No  . Sexual activity: Not Currently  Other Topics Concern  . Not on file  Social History Narrative  . Not on file    Family History  Problem Relation Age of Onset  . Breast cancer Mother 27  . Heart disease Mother   . Throat cancer Father   . Hypertension Father   . Arthritis Father   . Diabetes Father   . Arthritis Sister   . Obesity Sister   . Diabetes Sister   . Heart disease Cousin   . Colon cancer Neg Hx   . Stomach cancer Neg Hx   . Esophageal cancer Neg Hx     Allergies  Allergen Reactions  . Sulfonamide Derivatives Swelling    Mouth swelling  . Tramadol Nausea And Vomiting  . Aricept [Donepezil Hcl]     GI upset/loose stools    Outpatient Encounter Medications as of 06/29/2017  Medication Sig  . albuterol  (PROVENTIL HFA;VENTOLIN HFA) 108 (90 Base) MCG/ACT inhaler Inhale 1-2 puffs into the lungs every 6 (six) hours as needed for wheezing or shortness of breath.  . anastrozole (ARIMIDEX) 1 MG tablet Take 1 tablet (1 mg total) by mouth daily.  . budesonide-formoterol (SYMBICORT) 160-4.5 MCG/ACT inhaler Inhale 2 puffs into the lungs 2 (two) times daily. For bronchitis  . cetirizine (ZYRTEC) 10 MG tablet Take 10 mg by mouth at bedtime.  [name]  clonazePAM (KLONOPIN) 1 MG tablet Take 2 tablets (2 mg total) by mouth at bedtime. For anxiety  . docusate sodium (COLACE) 100 MG capsule Take 100 mg by mouth daily as needed for mild constipation. TAKE PER BOTTLE AS NEEDED FOR CONSTIPATION   . DULoxetine (CYMBALTA) 60 MG capsule TAKE 1 CAPSULE BY MOUTH EVERY DAY FOR anxiety  . estradiol (ESTRACE) 0.1 MG/GM vaginal cream Use every other evening use in vaginal area  . hydrOXYzine (ATARAX/VISTARIL) 10 MG tablet Take 1 tablet (10 mg total) by mouth 3 (three) times daily as needed for itching.  . insulin aspart (NOVOLOG FLEXPEN) 100 UNIT/ML FlexPen Inject 34 Units into the skin 2 (two) times daily.  . Insulin Glargine (TOUJEO SOLOSTAR) 300 UNIT/ML SOPN Inject 70 Units into the skin 2 (two) times daily. (before breakfast and before supper)  . isosorbide mononitrate (IMDUR) 60 MG 24 hr tablet TAKE 1 TABLET BY MOUTH EVERY DAY  . Lancets (ONETOUCH ULTRASOFT) lancets Check blood sugar 2-3 times daily as directed  . losartan (COZAAR) 50 MG tablet Take 1 tablet (50 mg total) by mouth daily.  . meclizine (ANTIVERT) 25 MG tablet Take 1 tablet (25 mg total) by mouth 3 (three) times daily as needed for dizziness.  . memantine (NAMENDA) 5 MG tablet Take 2 tabs po every AM and 1 tab po every PM for memory  . metoprolol succinate (TOPROL-XL) 25 MG 24 hr tablet Take one tablet by mouth once daily for blood pressure  . nitroGLYCERIN (NITROSTAT) 0.4 MG SL tablet Take one tablet under the tongue every 5 minutes as needed for chest pain  .  ONETOUCH VERIO test strip Use to test blood sugar three times daily. Dx: E11.00  . oxyCODONE-acetaminophen (PERCOCET) 10-325 MG tablet Take 1 tablet by mouth every 6 (six) hours.  . pantoprazole (PROTONIX) 40 MG tablet Take 1 tablet (40 mg total) by mouth daily.  . pravastatin (PRAVACHOL) 20 MG tablet Take one tablet by mouth once daily for cholesterol  . rivaroxaban (XARELTO) 10 MG TABS tablet Take 1 tablet (10 mg total) by mouth daily.  . sodium chloride (OCEAN) 0.65 % SOLN nasal spray Place 1 spray into both nostrils 3 (three) times daily as needed (for sinus congestion.). RINSE AS NEEDED FOR SINUS CONGESTION   . Spacer/Aero-Holding Chambers (AEROCHAMBER PLUS FLO-VU LARGE) MISC 1 each by Other route once.  . SPS 15 GM/60ML suspension TAKE 120 MLS (30 G TOTAL) BY MOUTH ONCE FOR 1 DOSE  . ULORIC 40 MG tablet TAKE 1 TABLET BY MOUTH ONCE daily   No facility-administered encounter medications on file as of 06/29/2017.     Review of Systems:  Review of Systems  Unable to perform ROS: Dementia    Health Maintenance  Topic Date Due  . OPHTHALMOLOGY EXAM  04/25/2017  . MAMMOGRAM  08/16/2017  . HEMOGLOBIN A1C  12/06/2017  . FOOT EXAM  12/12/2017  . TETANUS/TDAP  05/08/2018  . INFLUENZA VACCINE  Completed  . DEXA SCAN  Completed  . PNA vac Low Risk Adult  Completed    Physical Exam: Vitals:   06/29/17 0858  BP: 136/88  Pulse: 83  Resp: 10  Temp: 97.7 F (36.5 C)  TempSrc: Oral  SpO2: 94%   There is no height or weight on file to calculate BMI. Physical Exam  Constitutional: She appears well-developed and well-nourished.  Cardiovascular: Normal rate, regular rhythm and intact distal pulses. Exam reveals no gallop and no friction rub.  Murmur (1/6 SEM) heard. LE edema b/l.  No calf TTP  Pulmonary/Chest: Effort normal and breath sounds normal. No respiratory distress. She has no wheezes. She has no rales. She exhibits no tenderness.  Abdominal: Soft. Bowel sounds are normal. She  exhibits no distension and no mass. There is no tenderness. There is no rebound and no guarding.  obese  Musculoskeletal: She exhibits edema.  Neurological: She is alert.  Skin: Skin is warm and dry. No rash noted.  Psychiatric: She has a normal mood and affect. Her behavior is normal. Thought content normal.    Labs reviewed: Basic Metabolic Panel: Recent Labs    08/02/16 1551  01/16/17 1516 03/21/17 1351 06/08/17 1015 06/14/17 1117  NA  --    < > 137 139 137 141  K  --    < > 4.4 4.7 5.6* 4.9  CL  --    < > 105  --  108 109  CO2  --    < > 25 24 25 23   GLUCOSE  --    < > 44* 208* 169* 110  BUN  --    < > 27* 28.5* 30* 26*  CREATININE  --    < > 1.52* 1.4* 1.44* 1.30*  CALCIUM  --    < > 8.7 9.1 8.6 8.7  TSH 1.52  --   --   --   --   --    < > = values in this interval not displayed.   Liver Function Tests: Recent Labs    07/27/16 1027 09/20/16 0923 01/16/17 1516 03/21/17 1351 06/08/17 1015  AST 14 11 15 12   --   ALT 9 12 9 10 9   ALKPHOS 84 92  --  77  --   BILITOT 0.3 0.44 0.3 0.30  --   PROT 7.5 7.5 7.4 7.9  --   ALBUMIN 3.3* 2.8*  --  2.8*  --    No results for input(s): LIPASE, AMYLASE in the last 8760 hours. No results for input(s): AMMONIA in the last 8760 hours. CBC: Recent Labs    08/11/16 1130 09/20/16 0923 03/21/17 1351  WBC 13.5* 10.7* 14.2*  NEUTROABS 8,910* 6.9* 10.8*  HGB 9.3* 8.9* 8.7*  HCT 29.7* 29.1* 28.9*  MCV 84.4 87.1 84.3  PLT 356 282 295   Lipid Panel: Recent Labs    10/13/16 1230 01/16/17 1516 06/08/17 1015  CHOL 99 114 92  HDL 34* 38* 38*  LDLCALC 41  --   --   TRIG 118 149 89  CHOLHDL 2.9 3.0 2.4   Lab Results  Component Value Date   HGBA1C 7.8 (H) 06/08/2017   Urine dipstick reveals WBCs and odor but no nitrites.  Procedures since last visit: No results found.  Assessment/Plan   ICD-10-CM   1. Abnormal urinalysis R82.90 Culture, Urine  2. Dysuria R30.0 POC Urinalysis Dipstick    Culture, Urine  3. Chronic  vulvitis N76.3 Ambulatory referral to Gynecology  4. Endometrial polyp N84.0 Ambulatory referral to Gynecology   Continue current medications as ordered  Will call with urine culture results and GYN referral  Follow up as scheduled or sooner if need be.   Genene Kilman S. Perlie Gold  [facility] [address] [place], [place] [address] [phone] Cell (Monday-Friday 8 AM - 5 PM) [phone] After 5 PM and follow prompts

## 2017-06-29 NOTE — Patient Instructions (Signed)
Continue current medications as ordered  Will call with urine culture results and GYN referral  Follow up as scheduled or sooner if need be.

## 2017-07-02 ENCOUNTER — Other Ambulatory Visit: Payer: Self-pay

## 2017-07-02 DIAGNOSIS — G8929 Other chronic pain: Secondary | ICD-10-CM | POA: Diagnosis not present

## 2017-07-02 DIAGNOSIS — M353 Polymyalgia rheumatica: Secondary | ICD-10-CM | POA: Diagnosis not present

## 2017-07-02 DIAGNOSIS — F0390 Unspecified dementia without behavioral disturbance: Secondary | ICD-10-CM | POA: Diagnosis not present

## 2017-07-02 DIAGNOSIS — M1991 Primary osteoarthritis, unspecified site: Secondary | ICD-10-CM | POA: Diagnosis not present

## 2017-07-02 DIAGNOSIS — F329 Major depressive disorder, single episode, unspecified: Secondary | ICD-10-CM | POA: Diagnosis not present

## 2017-07-02 DIAGNOSIS — I5032 Chronic diastolic (congestive) heart failure: Secondary | ICD-10-CM | POA: Diagnosis not present

## 2017-07-02 DIAGNOSIS — E1121 Type 2 diabetes mellitus with diabetic nephropathy: Secondary | ICD-10-CM | POA: Diagnosis not present

## 2017-07-02 DIAGNOSIS — I2510 Atherosclerotic heart disease of native coronary artery without angina pectoris: Secondary | ICD-10-CM | POA: Diagnosis not present

## 2017-07-02 DIAGNOSIS — I110 Hypertensive heart disease with heart failure: Secondary | ICD-10-CM | POA: Diagnosis not present

## 2017-07-02 DIAGNOSIS — F419 Anxiety disorder, unspecified: Secondary | ICD-10-CM | POA: Diagnosis not present

## 2017-07-02 DIAGNOSIS — I252 Old myocardial infarction: Secondary | ICD-10-CM | POA: Diagnosis not present

## 2017-07-02 DIAGNOSIS — M109 Gout, unspecified: Secondary | ICD-10-CM | POA: Diagnosis not present

## 2017-07-02 DIAGNOSIS — J449 Chronic obstructive pulmonary disease, unspecified: Secondary | ICD-10-CM | POA: Diagnosis not present

## 2017-07-02 LAB — URINE CULTURE
MICRO NUMBER:: 90237485
SPECIMEN QUALITY:: ADEQUATE

## 2017-07-02 NOTE — Telephone Encounter (Signed)
Called patient, no answer, and no voicemail.  I called patient's daughter and left message on voicemail informing her that rx was sent to [facility] for UTI treatment, If patient prefers another pharmacy instructed to call.  Dr.Carter please advise if ok to override refill with black box warning

## 2017-07-02 NOTE — Telephone Encounter (Signed)
-----   Message from [place], [place] sent at 07/02/2017 12:32 PM EST ----- (+) UTI - start levaquin 250mg  #7 take 1 tab po daily x 7 days, no RF; take probiotic daily while on antibiotic; follow up as scheduled; keep GYN referral

## 2017-07-03 ENCOUNTER — Other Ambulatory Visit: Payer: Self-pay | Admitting: *Deleted

## 2017-07-03 ENCOUNTER — Encounter: Payer: Self-pay | Admitting: *Deleted

## 2017-07-03 MED ORDER — LEVOFLOXACIN 250 MG PO TABS: 250.0000 mg | ORAL_TABLET | Freq: Every day | ORAL | 0 refills | Status: DC

## 2017-07-03 NOTE — Patient Outreach (Signed)
[facility]) Care Management  07/03/2017  [name] 09-12-1934 [id number]   CSW was able to meet with patient and patient's daughter, [name] today to complete patient's Advanced Directives ([place] documents).  Patient was given a list of [place] affiliated facilities that are able to notarize the documents for patient, free of charge.  Patient has been encouraged to provide a copy to her Primary Care Physician, Dr. Gildardo Cranker to scan into Epic (Electronic Medical Record).  Patient has also been encouraged to keep the original documents and provide two copies to her appointed healthcare agents.   CSW will perform a case closure on patient, as all goals of treatment have been met from social work standpoint and no additional social work needs have been identified at this time. CSW will fax an update to patient's Primary Care Physician, Dr. Gildardo Cranker to ensure that they are aware of CSW's involvement with patient's plan of care.  CSW will submit a case closure request to Alycia Rossetti, Care Management Assistant with [place] [facility], in the form of an [employer].   [facility] CM Care Plan Problem One     Most Recent Value  Care Plan Problem One  No Advanced Directives ([place] documents) in place.  Role Documenting the Problem One  Clinical Social Worker  Care Plan for Problem One  Active  [facility] [place] Long Term Goal   Patient will have Advanced Directives in place within the next 45 days.  [facility] Long Term Goal Start Date  06/25/17  [facility] Long Term Goal Met Date  07/03/17  Interventions for Problem One Long Term Goal  Patient has been encouraged to provide her PCP with a copy of the Advanced Directives for her medical record.  [facility] CM Short Term Goal #1   Patient will complete her Advanced Directives with the assistance of CSW, within the next two weeks.  [facility] CM Short Term Goal  #1 Start Date  06/25/17  [facility] CM Short Term Goal #1 Met Date  07/03/17  Interventions for Short Term Goal #1  CSW has scheduled an initial home visit with patient to assist with completion of Advanced Directives.  [facility] CM Short Term Goal #2   Patient will have her Advanced Directives notarized through a [place] affiliated facility within the next 30 days.  [facility] CM Short Term Goal #2 Start Date  06/25/17  [facility] CM Short Term Goal #2 Met Date  07/03/17  Interventions for Short Term Goal #2  Patient has been given a list of [place] facilities that offer free notary services.    Nat Christen, BSW, MSW, LCSW  Licensed Education officer, [facility]  [place] [place] [address] [place], [place] [address] Physical Address-[address], [place], [place] [address] Toll Free Main # [phone] Fax # [phone] Cell # [phone]  Office # [phone] [email or web address]

## 2017-07-04 ENCOUNTER — Telehealth: Payer: Self-pay

## 2017-07-04 NOTE — Telephone Encounter (Signed)
I called patient to inform her that 9 pens of toujeo came in for her from patient assistance.  Patient was glad I called for she was planning to call us about the antibiotic (levaquin). Patient states she read the package insert and she is terrified to take medication. Patient states it can interfere with her other medications and result in death. Patient is requesting something else.   Please advise

## 2017-07-04 NOTE — Telephone Encounter (Signed)
I am aware of the medication's package insert. The bug in her urine is most sensitive to this medication and this is the best medication for her. It is the lowest dose and she really needs to take it in order to clear her infection effectively

## 2017-07-04 NOTE — Telephone Encounter (Signed)
Patient aware of Dr.Carter's response and agreed to take medication

## 2017-07-06 DIAGNOSIS — Z6841 Body Mass Index (BMI) 40.0 and over, adult: Secondary | ICD-10-CM | POA: Diagnosis not present

## 2017-07-06 DIAGNOSIS — N898 Other specified noninflammatory disorders of vagina: Secondary | ICD-10-CM | POA: Diagnosis not present

## 2017-07-09 ENCOUNTER — Telehealth: Payer: Self-pay | Admitting: *Deleted

## 2017-07-09 DIAGNOSIS — F329 Major depressive disorder, single episode, unspecified: Secondary | ICD-10-CM | POA: Diagnosis not present

## 2017-07-09 DIAGNOSIS — J449 Chronic obstructive pulmonary disease, unspecified: Secondary | ICD-10-CM | POA: Diagnosis not present

## 2017-07-09 DIAGNOSIS — E1121 Type 2 diabetes mellitus with diabetic nephropathy: Secondary | ICD-10-CM | POA: Diagnosis not present

## 2017-07-09 DIAGNOSIS — M353 Polymyalgia rheumatica: Secondary | ICD-10-CM | POA: Diagnosis not present

## 2017-07-09 DIAGNOSIS — I2510 Atherosclerotic heart disease of native coronary artery without angina pectoris: Secondary | ICD-10-CM | POA: Diagnosis not present

## 2017-07-09 DIAGNOSIS — G8929 Other chronic pain: Secondary | ICD-10-CM | POA: Diagnosis not present

## 2017-07-09 DIAGNOSIS — I5032 Chronic diastolic (congestive) heart failure: Secondary | ICD-10-CM | POA: Diagnosis not present

## 2017-07-09 DIAGNOSIS — F0390 Unspecified dementia without behavioral disturbance: Secondary | ICD-10-CM | POA: Diagnosis not present

## 2017-07-09 DIAGNOSIS — F419 Anxiety disorder, unspecified: Secondary | ICD-10-CM | POA: Diagnosis not present

## 2017-07-09 DIAGNOSIS — I110 Hypertensive heart disease with heart failure: Secondary | ICD-10-CM | POA: Diagnosis not present

## 2017-07-09 DIAGNOSIS — M1991 Primary osteoarthritis, unspecified site: Secondary | ICD-10-CM | POA: Diagnosis not present

## 2017-07-09 DIAGNOSIS — M109 Gout, unspecified: Secondary | ICD-10-CM | POA: Diagnosis not present

## 2017-07-09 DIAGNOSIS — I252 Old myocardial infarction: Secondary | ICD-10-CM | POA: Diagnosis not present

## 2017-07-09 NOTE — Telephone Encounter (Signed)
[name] with [facility] called requesting verbal orders for PT 2X4wks.  Verbal orders given.

## 2017-07-12 ENCOUNTER — Other Ambulatory Visit: Payer: Self-pay | Admitting: *Deleted

## 2017-07-12 DIAGNOSIS — G894 Chronic pain syndrome: Secondary | ICD-10-CM

## 2017-07-12 NOTE — Telephone Encounter (Signed)
Error. Patient refill due 07/20/17

## 2017-07-12 NOTE — Telephone Encounter (Deleted)
Patient Requested refill [place]CCS[place] Database Verified LR date: 06/22/17 Pharmacy Confirmed Pended Rx and sent to Dr. Eulas Post for Approval.

## 2017-07-16 DIAGNOSIS — I252 Old myocardial infarction: Secondary | ICD-10-CM | POA: Diagnosis not present

## 2017-07-16 DIAGNOSIS — I5032 Chronic diastolic (congestive) heart failure: Secondary | ICD-10-CM | POA: Diagnosis not present

## 2017-07-16 DIAGNOSIS — I2510 Atherosclerotic heart disease of native coronary artery without angina pectoris: Secondary | ICD-10-CM | POA: Diagnosis not present

## 2017-07-16 DIAGNOSIS — F329 Major depressive disorder, single episode, unspecified: Secondary | ICD-10-CM | POA: Diagnosis not present

## 2017-07-16 DIAGNOSIS — J449 Chronic obstructive pulmonary disease, unspecified: Secondary | ICD-10-CM | POA: Diagnosis not present

## 2017-07-16 DIAGNOSIS — M109 Gout, unspecified: Secondary | ICD-10-CM | POA: Diagnosis not present

## 2017-07-16 DIAGNOSIS — M353 Polymyalgia rheumatica: Secondary | ICD-10-CM | POA: Diagnosis not present

## 2017-07-16 DIAGNOSIS — I110 Hypertensive heart disease with heart failure: Secondary | ICD-10-CM | POA: Diagnosis not present

## 2017-07-16 DIAGNOSIS — F0390 Unspecified dementia without behavioral disturbance: Secondary | ICD-10-CM | POA: Diagnosis not present

## 2017-07-16 DIAGNOSIS — E1121 Type 2 diabetes mellitus with diabetic nephropathy: Secondary | ICD-10-CM | POA: Diagnosis not present

## 2017-07-16 DIAGNOSIS — G8929 Other chronic pain: Secondary | ICD-10-CM | POA: Diagnosis not present

## 2017-07-16 DIAGNOSIS — F419 Anxiety disorder, unspecified: Secondary | ICD-10-CM | POA: Diagnosis not present

## 2017-07-16 DIAGNOSIS — M1991 Primary osteoarthritis, unspecified site: Secondary | ICD-10-CM | POA: Diagnosis not present

## 2017-07-19 ENCOUNTER — Other Ambulatory Visit: Payer: Self-pay | Admitting: *Deleted

## 2017-07-19 DIAGNOSIS — G894 Chronic pain syndrome: Secondary | ICD-10-CM

## 2017-07-19 MED ORDER — OXYCODONE-ACETAMINOPHEN 10-325 MG PO TABS: 1.0000 | ORAL_TABLET | Freq: Four times a day (QID) | ORAL | 0 refills | Status: DC

## 2017-07-19 NOTE — Telephone Encounter (Signed)
Patient requested refill [place] Verified LR 06/22/17 Pharmacy Confirmed Pended Rx and sent to Dr. Eulas Post for approval.

## 2017-07-23 ENCOUNTER — Telehealth: Payer: Self-pay | Admitting: *Deleted

## 2017-07-23 DIAGNOSIS — I5032 Chronic diastolic (congestive) heart failure: Secondary | ICD-10-CM | POA: Diagnosis not present

## 2017-07-23 DIAGNOSIS — G8929 Other chronic pain: Secondary | ICD-10-CM | POA: Diagnosis not present

## 2017-07-23 DIAGNOSIS — J449 Chronic obstructive pulmonary disease, unspecified: Secondary | ICD-10-CM | POA: Diagnosis not present

## 2017-07-23 DIAGNOSIS — I110 Hypertensive heart disease with heart failure: Secondary | ICD-10-CM | POA: Diagnosis not present

## 2017-07-23 DIAGNOSIS — F329 Major depressive disorder, single episode, unspecified: Secondary | ICD-10-CM | POA: Diagnosis not present

## 2017-07-23 DIAGNOSIS — F0390 Unspecified dementia without behavioral disturbance: Secondary | ICD-10-CM | POA: Diagnosis not present

## 2017-07-23 DIAGNOSIS — M109 Gout, unspecified: Secondary | ICD-10-CM | POA: Diagnosis not present

## 2017-07-23 DIAGNOSIS — M353 Polymyalgia rheumatica: Secondary | ICD-10-CM | POA: Diagnosis not present

## 2017-07-23 DIAGNOSIS — E1121 Type 2 diabetes mellitus with diabetic nephropathy: Secondary | ICD-10-CM | POA: Diagnosis not present

## 2017-07-23 DIAGNOSIS — I252 Old myocardial infarction: Secondary | ICD-10-CM | POA: Diagnosis not present

## 2017-07-23 DIAGNOSIS — I2510 Atherosclerotic heart disease of native coronary artery without angina pectoris: Secondary | ICD-10-CM | POA: Diagnosis not present

## 2017-07-23 DIAGNOSIS — M1991 Primary osteoarthritis, unspecified site: Secondary | ICD-10-CM | POA: Diagnosis not present

## 2017-07-23 DIAGNOSIS — F419 Anxiety disorder, unspecified: Secondary | ICD-10-CM | POA: Diagnosis not present

## 2017-07-23 NOTE — Telephone Encounter (Signed)
Just a FYI from [place] at [facility], pt slid out of bed last night, no injuries, just wanted to let you know.

## 2017-07-25 ENCOUNTER — Emergency Department (HOSPITAL_COMMUNITY): Payer: PPO

## 2017-07-25 ENCOUNTER — Other Ambulatory Visit: Payer: Self-pay | Admitting: *Deleted

## 2017-07-25 ENCOUNTER — Inpatient Hospital Stay (HOSPITAL_COMMUNITY)
Admission: EM | Admit: 2017-07-25 | Discharge: 2017-07-28 | DRG: 638 | Disposition: A | Discharge status: Home Health | Payer: PPO | Attending: Internal Medicine | Admitting: Internal Medicine

## 2017-07-25 DIAGNOSIS — IMO0002 Reserved for concepts with insufficient information to code with codable children: Secondary | ICD-10-CM

## 2017-07-25 DIAGNOSIS — Z6841 Body Mass Index (BMI) 40.0 and over, adult: Secondary | ICD-10-CM

## 2017-07-25 DIAGNOSIS — Z79890 Hormone replacement therapy (postmenopausal): Secondary | ICD-10-CM

## 2017-07-25 DIAGNOSIS — J9811 Atelectasis: Secondary | ICD-10-CM | POA: Diagnosis not present

## 2017-07-25 DIAGNOSIS — N281 Cyst of kidney, acquired: Secondary | ICD-10-CM | POA: Diagnosis not present

## 2017-07-25 DIAGNOSIS — K219 Gastro-esophageal reflux disease without esophagitis: Secondary | ICD-10-CM | POA: Diagnosis present

## 2017-07-25 DIAGNOSIS — E785 Hyperlipidemia, unspecified: Secondary | ICD-10-CM | POA: Diagnosis present

## 2017-07-25 DIAGNOSIS — R531 Weakness: Secondary | ICD-10-CM

## 2017-07-25 DIAGNOSIS — Z66 Do not resuscitate: Secondary | ICD-10-CM | POA: Diagnosis not present

## 2017-07-25 DIAGNOSIS — I252 Old myocardial infarction: Secondary | ICD-10-CM | POA: Diagnosis not present

## 2017-07-25 DIAGNOSIS — N183 Chronic kidney disease, stage 3 (moderate): Secondary | ICD-10-CM | POA: Diagnosis present

## 2017-07-25 DIAGNOSIS — E162 Hypoglycemia, unspecified: Secondary | ICD-10-CM | POA: Diagnosis not present

## 2017-07-25 DIAGNOSIS — R627 Adult failure to thrive: Secondary | ICD-10-CM | POA: Diagnosis present

## 2017-07-25 DIAGNOSIS — E1122 Type 2 diabetes mellitus with diabetic chronic kidney disease: Secondary | ICD-10-CM | POA: Diagnosis present

## 2017-07-25 DIAGNOSIS — E875 Hyperkalemia: Secondary | ICD-10-CM | POA: Diagnosis present

## 2017-07-25 DIAGNOSIS — G8929 Other chronic pain: Secondary | ICD-10-CM | POA: Diagnosis present

## 2017-07-25 DIAGNOSIS — E1142 Type 2 diabetes mellitus with diabetic polyneuropathy: Secondary | ICD-10-CM | POA: Diagnosis not present

## 2017-07-25 DIAGNOSIS — Z79899 Other long term (current) drug therapy: Secondary | ICD-10-CM

## 2017-07-25 DIAGNOSIS — D638 Anemia in other chronic diseases classified elsewhere: Secondary | ICD-10-CM | POA: Diagnosis not present

## 2017-07-25 DIAGNOSIS — Z794 Long term (current) use of insulin: Secondary | ICD-10-CM

## 2017-07-25 DIAGNOSIS — I5032 Chronic diastolic (congestive) heart failure: Secondary | ICD-10-CM | POA: Diagnosis present

## 2017-07-25 DIAGNOSIS — Z853 Personal history of malignant neoplasm of breast: Secondary | ICD-10-CM | POA: Diagnosis not present

## 2017-07-25 DIAGNOSIS — F329 Major depressive disorder, single episode, unspecified: Secondary | ICD-10-CM | POA: Diagnosis not present

## 2017-07-25 DIAGNOSIS — F0390 Unspecified dementia without behavioral disturbance: Secondary | ICD-10-CM | POA: Diagnosis not present

## 2017-07-25 DIAGNOSIS — M109 Gout, unspecified: Secondary | ICD-10-CM | POA: Diagnosis not present

## 2017-07-25 DIAGNOSIS — E1121 Type 2 diabetes mellitus with diabetic nephropathy: Secondary | ICD-10-CM

## 2017-07-25 DIAGNOSIS — E119 Type 2 diabetes mellitus without complications: Secondary | ICD-10-CM | POA: Diagnosis not present

## 2017-07-25 DIAGNOSIS — Z86711 Personal history of pulmonary embolism: Secondary | ICD-10-CM

## 2017-07-25 DIAGNOSIS — I2510 Atherosclerotic heart disease of native coronary artery without angina pectoris: Secondary | ICD-10-CM | POA: Diagnosis present

## 2017-07-25 DIAGNOSIS — Z86718 Personal history of other venous thrombosis and embolism: Secondary | ICD-10-CM | POA: Diagnosis not present

## 2017-07-25 DIAGNOSIS — I130 Hypertensive heart and chronic kidney disease with heart failure and stage 1 through stage 4 chronic kidney disease, or unspecified chronic kidney disease: Secondary | ICD-10-CM | POA: Diagnosis not present

## 2017-07-25 DIAGNOSIS — G4733 Obstructive sleep apnea (adult) (pediatric): Secondary | ICD-10-CM | POA: Diagnosis not present

## 2017-07-25 DIAGNOSIS — I509 Heart failure, unspecified: Secondary | ICD-10-CM | POA: Diagnosis not present

## 2017-07-25 DIAGNOSIS — Z955 Presence of coronary angioplasty implant and graft: Secondary | ICD-10-CM

## 2017-07-25 DIAGNOSIS — M353 Polymyalgia rheumatica: Secondary | ICD-10-CM | POA: Diagnosis present

## 2017-07-25 DIAGNOSIS — E11649 Type 2 diabetes mellitus with hypoglycemia without coma: Secondary | ICD-10-CM | POA: Diagnosis not present

## 2017-07-25 DIAGNOSIS — Z7901 Long term (current) use of anticoagulants: Secondary | ICD-10-CM

## 2017-07-25 DIAGNOSIS — E1165 Type 2 diabetes mellitus with hyperglycemia: Secondary | ICD-10-CM

## 2017-07-25 DIAGNOSIS — E161 Other hypoglycemia: Secondary | ICD-10-CM | POA: Diagnosis not present

## 2017-07-25 DIAGNOSIS — R7989 Other specified abnormal findings of blood chemistry: Secondary | ICD-10-CM

## 2017-07-25 LAB — CBG MONITORING, ED
Glucose-Capillary: 62 mg/dL — ABNORMAL LOW (ref 65–99)
Glucose-Capillary: 220 mg/dL — ABNORMAL HIGH (ref 65–99)
Glucose-Capillary: 156 mg/dL — ABNORMAL HIGH (ref 65–99)

## 2017-07-25 LAB — URINALYSIS, ROUTINE W REFLEX MICROSCOPIC
Bacteria, UA: NONE SEEN
Bilirubin Urine: NEGATIVE
Glucose, UA: NEGATIVE mg/dL
Ketones, ur: NEGATIVE mg/dL
Leukocytes, UA: NEGATIVE
Nitrite: NEGATIVE
Protein, ur: NEGATIVE mg/dL
Specific Gravity, Urine: 1.012 (ref 1.005–1.030)
pH: 5 (ref 5.0–8.0)

## 2017-07-25 LAB — COMPREHENSIVE METABOLIC PANEL
ALT: 14 U/L (ref 14–54)
AST: 23 U/L (ref 15–41)
Albumin: 3.1 g/dL — ABNORMAL LOW (ref 3.5–5.0)
Alkaline Phosphatase: 83 U/L (ref 38–126)
Anion gap: 4 — ABNORMAL LOW (ref 5–15)
BUN: 31 mg/dL — ABNORMAL HIGH (ref 6–20)
CO2: 26 mmol/L (ref 22–32)
Calcium: 9 mg/dL (ref 8.9–10.3)
Chloride: 107 mmol/L (ref 101–111)
Creatinine, Ser: 1.5 mg/dL — ABNORMAL HIGH (ref 0.44–1.00)
GFR calc Af Amer: 37 mL/min — ABNORMAL LOW (ref >60)
GFR calc non Af Amer: 32 mL/min — ABNORMAL LOW (ref >60)
Glucose, Bld: 108 mg/dL — ABNORMAL HIGH (ref 65–99)
Potassium: 6.4 mmol/L (ref 3.5–5.1)
Sodium: 137 mmol/L (ref 135–145)
Total Bilirubin: 0.4 mg/dL (ref 0.3–1.2)
Total Protein: 8.8 g/dL — ABNORMAL HIGH (ref 6.5–8.1)

## 2017-07-25 LAB — CBC WITH DIFFERENTIAL/PLATELET
Basophils Absolute: 0 10*3/uL (ref 0.0–0.1)
Basophils Relative: 0 %
Eosinophils Absolute: 0.2 10*3/uL (ref 0.0–0.7)
Eosinophils Relative: 2 %
HCT: 30 % — ABNORMAL LOW (ref 36.0–46.0)
Hemoglobin: 8.8 g/dL — ABNORMAL LOW (ref 12.0–15.0)
Lymphocytes Relative: 22 %
Lymphs Abs: 2.8 10*3/uL (ref 0.7–4.0)
MCH: 25 pg — ABNORMAL LOW (ref 26.0–34.0)
MCHC: 29.3 g/dL — ABNORMAL LOW (ref 30.0–36.0)
MCV: 85.2 fL (ref 78.0–100.0)
Monocytes Absolute: 0.9 10*3/uL (ref 0.1–1.0)
Monocytes Relative: 7 %
Neutro Abs: 8.9 10*3/uL — ABNORMAL HIGH (ref 1.7–7.7)
Neutrophils Relative %: 69 %
Platelets: 380 10*3/uL (ref 150–400)
RBC: 3.52 MIL/uL — ABNORMAL LOW (ref 3.87–5.11)
RDW: 16.3 % — ABNORMAL HIGH (ref 11.5–15.5)
WBC: 12.8 10*3/uL — ABNORMAL HIGH (ref 4.0–10.5)

## 2017-07-25 LAB — POTASSIUM: Potassium: 7.1 mmol/L (ref 3.5–5.1)

## 2017-07-25 MED ORDER — ALBUTEROL SULFATE (2.5 MG/3ML) 0.083% IN NEBU
10.0000 mg | INHALATION_SOLUTION | Freq: Once | RESPIRATORY_TRACT | Status: AC
  Administered 2017-07-25: 10 mg via RESPIRATORY_TRACT
  Filled 2017-07-25: qty 12

## 2017-07-25 MED ORDER — ISOSORBIDE MONONITRATE ER 60 MG PO TB24: ORAL_TABLET | ORAL | 0 refills | Status: DC

## 2017-07-25 MED ORDER — FEBUXOSTAT 40 MG PO TABS: 40.0000 mg | ORAL_TABLET | Freq: Every day | ORAL | 0 refills | Status: DC

## 2017-07-25 MED ORDER — DULOXETINE HCL 60 MG PO CPEP: ORAL_CAPSULE | ORAL | 0 refills | Status: DC

## 2017-07-25 MED ORDER — DEXAMETHASONE SODIUM PHOSPHATE 10 MG/ML IJ SOLN: 10.0000 mg | Freq: Once | INTRAMUSCULAR | Status: DC

## 2017-07-25 MED ORDER — SODIUM BICARBONATE 8.4 % IV SOLN
50.0000 meq | Freq: Once | INTRAVENOUS | Status: AC
  Administered 2017-07-25: 50 meq via INTRAVENOUS
  Filled 2017-07-25: qty 50

## 2017-07-25 MED ORDER — SODIUM CHLORIDE 0.9 % IV SOLN
1.0000 g | Freq: Once | INTRAVENOUS | Status: AC
  Administered 2017-07-25: 1 g via INTRAVENOUS
  Filled 2017-07-25: qty 10

## 2017-07-25 MED ORDER — CALCIUM GLUCONATE 10 % IV SOLN: 1.0000 g | Freq: Once | INTRAVENOUS | Status: DC

## 2017-07-25 MED ORDER — SODIUM CHLORIDE 0.9 % IV BOLUS (SEPSIS)
500.0000 mL | Freq: Once | INTRAVENOUS | Status: AC
  Administered 2017-07-25: 500 mL via INTRAVENOUS

## 2017-07-25 MED ORDER — PRAVASTATIN SODIUM 20 MG PO TABS: ORAL_TABLET | ORAL | 0 refills | Status: DC

## 2017-07-25 MED ORDER — SODIUM POLYSTYRENE SULFONATE 15 GM/60ML PO SUSP
30.0000 g | Freq: Once | ORAL | Status: AC
  Administered 2017-07-25: 30 g via ORAL
  Filled 2017-07-25: qty 120

## 2017-07-25 NOTE — ED Triage Notes (Signed)
Per EMS: Pt is coming from home.  Per family, pt has been having bouts of confusion. Pt has only been eating cereal, and then taking her regular dose of insulin.  Pt's family reports this has been going on the past 4 days.  Alert and oriented x4 with EMS. Upon arrival of EMS: CBG was 30, Fire gave 15 of oral glucose.  PT is trying to lose weight and that is why she's only eating cereal.  Pt was on antibiotics for a possible UTI.

## 2017-07-25 NOTE — ED Notes (Signed)
Pt cannot use restroom at this time, aware urine specimen is needed.  

## 2017-07-25 NOTE — ED Notes (Signed)
Bed: CB76 Expected date:  Expected time:  Means of arrival:  Comments: EMS 83 y o/hypoglycemia

## 2017-07-25 NOTE — ED Notes (Signed)
Date and time results received: 07/25/17  (use smartphrase ".now" to insert current time)  Test:Potassium  Critical Value:6.4  Name of Provider Notified: J Ward, PA  Orders Received? Or Actions Taken?: none

## 2017-07-25 NOTE — Telephone Encounter (Signed)
[facility] 

## 2017-07-25 NOTE — ED Notes (Signed)
Date and time results received:  (use smartphrase ".now" to insert current time)  Test: potassium Critical Value: 7.1  Name of Provider Notified: J Ward, PA  Orders Received? Or Actions Taken  None

## 2017-07-25 NOTE — ED Provider Notes (Signed)
[facility] DEPT Provider Note   CSN: [id number] Arrival date & time: 07/25/17  1658     History   Chief Complaint Chief Complaint  Patient presents with  . Hypoglycemia    HPI [name] is a 83 y.o. female.  The history is provided by the patient and medical records. No language interpreter was used.  Hypoglycemia  Associated symptoms: speech difficulty, sweats and weakness    [name] is a 83 y.o. female  with a PMH as listed below including DM who presents to the Emergency Department by EMS for altered mental status.  Patient's family reports that over the last 4 days she has not been feeling well.  Patient agrees with this, stating that she just has not felt good, but cannot give any specific symptoms.  She denies any cough, congestion, fever, abdominal pain, nausea, vomiting, dysuria.  She does report having to urinate more than normal.  She was seen for this and diagnosed with UTI.  She did finish her antibiotics for this.  She believes this was about 5 or 6 days ago, but unsure on specific timeframe.  She also reports that she has been trying to lose weight.  She has been eating mostly cereal.  She has cut down on all of her food portions.  She has been continuing to take her typical doses of insulin.  Denies taking more of her medicines than usual.  This morning, she went to the restroom and felt too weak to get up.  Her grandson came and found her and called an ambulance.  Per EMS, patient was diaphoretic, confused and had slurred speech.  CBG of 30.  Given oral dextrose and CBG did improve to 72.  Her mentation improved and she was alert and oriented, answering questions appropriately upon ER arrival.   Past Medical History:  Diagnosis Date  . Allergy    takes Mucinex daily as needed  . Anemia, unspecified   . Anxiety    takes Clonazepam daily as needed  . Arthritis   . CHF (congestive heart failure) (HCC)    takes Furosemide  daily  . Coronary artery disease    a. s/p IMI 2004 tx with BMS to RCA;  b. s/p Promus DES to RCA 2/12 (cath: LM 20-30%, pLAD 20-30%, mLAD 50%, RI 30%, pRCA 60%, mRCA 90% - tx with PCI);  c. myoview 1/07: EF 49%, inf scar, no isch  . Coronary atherosclerosis of native coronary artery   . Depression    takes Cymbalta daily  . Diabetes mellitus    insulin daily  . Dysuria   . GERD (gastroesophageal reflux disease)    takes Protonix daily  . Gout, unspecified    takes Colchicine daily  . Headache    occasionally  . History of blood clots about 25yrs ago   in legs-takes Coumadin   . Hyperlipidemia    takes Pravastatin daily  . Hypertension    takes Imdur and Metoprolol daily  . Insomnia   . Joint pain   . Joint swelling   . Myocardial infarction ([place]) 2004  . Numbness   . Obstructive sleep apnea   . Osteoarthritis   . Osteoarthritis   . Osteoarthrosis, unspecified whether generalized or localized, lower leg   . Pain, chronic   . Polymyalgia rheumatica ([place])   . Pulmonary emboli ([place]) 9/13   felt to need lifelong anticoagulation  . Shortness of breath dyspnea    with exertion and  has Albuterol inhaler prn  . Type II or unspecified type diabetes mellitus without mention of complication, uncontrolled   . Urinary incontinence    takes Linzess daily  . Vertigo    hx of;was taking Meclizine if needed    Patient Active Problem List   Diagnosis Date Noted  . Anxiety associated with depression 06/08/2017  . GERD with esophagitis 11/15/2016  . Memory loss 11/15/2016  . Multiple benign nevi 11/15/2016  . Esophageal dysphagia   . Severe episode of recurrent major depressive disorder, without psychotic features ([place]) 06/23/2016  . Endometrial polyp 06/23/2016  . Vaginitis and vulvovaginitis 06/23/2016  . Cough variant asthma 11/17/2015  . Vitamin D deficiency 08/02/2015  . Breast cancer ([place]) 04/26/2015  . Dyslipidemia associated with type 2 diabetes mellitus ([place]) 02/06/2015    . Uncontrolled type 2 diabetes mellitus with diabetic nephropathy, with long-term current use of insulin ([place]) 02/06/2015  . Chronic pain syndrome 02/06/2015  . Morbid obesity with BMI of 50.0-59.9, adult ([place]) 12/18/2014  . CKD (chronic kidney disease) stage 3, GFR 30-59 ml/min (HCC) 12/18/2014  . Gout 10/27/2014  . Breast mass 10/27/2014  . Encounter for therapeutic drug monitoring 10/08/2014  . Vertigo 09/24/2014  . Chronic bronchitis ([place]) 09/23/2014  . Preoperative clearance 08/16/2014  . Abnormal stress test   . DOE (dyspnea on exertion) 06/24/2014  . CN (constipation) 05/10/2014  . History of pulmonary embolus (PE) 04/22/2014  . Sepsis secondary to UTI ([place]) 04/21/2014  . COPD (chronic obstructive pulmonary disease) ([place]) 04/21/2014  . Estrogen deficiency 02/19/2014  . Breast cancer screening 02/19/2014  . Weight gain 02/19/2014  . Pain in the chest 12/31/2013  . Type 2 diabetes mellitus with diabetic foot deformity ([place]) 07/29/2013  . OSA (obstructive sleep apnea) 02/24/2013  . Need for prophylactic vaccination and inoculation against influenza 01/08/2013  . Insomnia 09/17/2012  . Debility 08/01/2012  . Osteoarthritis   . Dysphagia, unspecified(787.20) 07/31/2012  . GERD (gastroesophageal reflux disease) 07/31/2012  . Long term current use of anticoagulant therapy 02/02/2012  . Chronic diastolic congestive heart failure ([place]) 01/29/2012  . History of deep venous thrombosis 01/27/2012  . Abdominal pain 01/26/2012  . CHOLELITHIASIS 06/06/2010  . Situational depression 06/04/2009  . ORTHOPNEA 03/09/2009  . Morbid (severe) obesity due to excess calories ([place]) 02/15/2009  . ANEMIA 02/15/2009  . POLYMYALGIA RHEUMATICA 01/18/2009  . TENSION HEADACHE 01/07/2008  . Headache 01/07/2008  . FUNGAL DERMATITIS 11/13/2007  . CAD (coronary artery disease) 08/29/2007  . URINARY INCONTINENCE 08/29/2007  . Hyperlipidemia 12/03/2006  . Essential hypertension 12/03/2006  .  DIVERTICULITIS, HX OF 12/03/2006    Past Surgical History:  Procedure Laterality Date  . ABDOMINAL HYSTERECTOMY     partial  . APPENDECTOMY    . blood clots/legs and lungs  2013  . BREAST BIOPSY Left 07/22/2014  . BREAST BIOPSY Left 02/10/2013  . BREAST LUMPECTOMY Left 11/05/2014  . BREAST LUMPECTOMY WITH RADIOACTIVE SEED LOCALIZATION Left 11/05/2014   Procedure: LEFT BREAST LUMPECTOMY WITH RADIOACTIVE SEED LOCALIZATION;  Surgeon: [name], MD;  Location: [facility];  Service: General;  Laterality: Left;  . CARDIAC CATHETERIZATION    . COLONOSCOPY    . CORONARY ANGIOPLASTY  2  . ESOPHAGOGASTRODUODENOSCOPY (EGD) WITH PROPOFOL N/A 11/07/2016   Procedure: ESOPHAGOGASTRODUODENOSCOPY (EGD) WITH PROPOFOL;  Surgeon: Gatha Mayer, MD;  Location: [facility];  Service: Endoscopy;  Laterality: N/A;  . EXCISION OF SKIN TAG Right 11/05/2014   Procedure: EXCISION OF RIGHT EYELID SKIN TAG;  Surgeon: [name], MD;  Location: [facility] OR;  Service: General;  Laterality: Right;  . EYE SURGERY Bilateral    cataract   . GASTRIC BYPASS  1977    reversed in 1979, [place] N/A 06/29/2014   Procedure: LEFT HEART CATHETERIZATION WITH CORONARY ANGIOGRAM;  Surgeon: Troy Sine, MD;  Location: [facility] C[facility];  Service: Cardiovascular;  Laterality: N/A;  . MI with stent placement  2004    OB History    No data available       Home Medications    Prior to Admission medications   Medication Sig Start Date End Date Taking? Authorizing Provider  anastrozole (ARIMIDEX) 1 MG tablet Take 1 tablet (1 mg total) by mouth daily. 09/20/16  Yes [name], MD  clonazePAM (KLONOPIN) 1 MG tablet Take 2 tablets (2 mg total) by mouth at bedtime. For anxiety 06/08/17  Yes Gildardo Cranker, DO  DULoxetine (CYMBALTA) 60 MG capsule Take one capsule by mouth once daily for anxiety 07/25/17  Yes Eulas Post, Monica, DO  febuxostat (ULORIC) 40 MG tablet Take 1  tablet (40 mg total) by mouth daily. 07/25/17  Yes Eulas Post, Monica, DO  insulin aspart (NOVOLOG FLEXPEN) 100 UNIT/ML FlexPen Inject 34 Units into the skin 2 (two) times daily. 06/18/17  Yes Eulas Post, Monica, DO  Insulin Glargine (TOUJEO SOLOSTAR) 300 UNIT/ML SOPN Inject 70 Units into the skin 2 (two) times daily. (before breakfast and before supper) 10/16/16  Yes Gildardo Cranker, DO  isosorbide mononitrate (IMDUR) 60 MG 24 hr tablet Take one tablet by mouth once daily 07/25/17  Yes Eulas Post, Monica, DO  losartan (COZAAR) 50 MG tablet Take 1 tablet (50 mg total) by mouth daily. 06/15/17  Yes Gildardo Cranker, DO  memantine [facility]) 5 MG tablet Take 2 tabs po every AM and 1 tab po every PM for memory 06/08/17  Yes Gildardo Cranker, DO  metoprolol succinate (TOPROL-XL) 25 MG 24 hr tablet Take one tablet by mouth once daily for blood pressure 04/23/17  Yes Gildardo Cranker, DO  oxyCODONE-acetaminophen (PERCOCET) 10-325 MG tablet Take 1 tablet by mouth every 6 (six) hours. 07/19/17  Yes Eulas Post, Monica, DO  pantoprazole (PROTONIX) 40 MG tablet Take 1 tablet (40 mg total) by mouth daily. 02/22/17  Yes Gildardo Cranker, DO  pravastatin (PRAVACHOL) 20 MG tablet Take one tablet by mouth once daily for cholesterol 07/25/17  Yes Eulas Post, [place], DO  rivaroxaban (XARELTO) 10 MG TABS tablet Take 1 tablet (10 mg total) by mouth daily. 06/12/17  Yes Eulas Post, Monica, DO  albuterol (PROVENTIL HFA;VENTOLIN HFA) 108 (90 Base) MCG/ACT inhaler Inhale 1-2 puffs into the lungs every 6 (six) hours as needed for wheezing or shortness of breath. 03/15/16   Robyn Haber, MD  budesonide-formoterol (SYMBICORT) 160-4.5 MCG/ACT inhaler Inhale 2 puffs into the lungs 2 (two) times daily. For bronchitis Patient taking differently: Inhale 2 puffs into the lungs 2 (two) times daily as needed (sob and wheezing). For bronchitis 06/18/17   Gildardo Cranker, DO  cetirizine (ZYRTEC) 10 MG tablet Take 10 mg by mouth at bedtime as needed for allergies.     [provider]  docusate sodium (COLACE) 100 MG capsule Take 100 mg by mouth daily as needed for mild constipation. TAKE PER BOTTLE AS NEEDED FOR CONSTIPATION     [provider]  estradiol (ESTRACE) 0.1 MG/GM vaginal cream Use every other evening use in vaginal area 02/06/17   [provider]  hydrOXYzine (ATARAX/VISTARIL) 10 MG tablet Take 1 tablet (  10 mg total) by mouth 3 (three) times daily as needed for itching. 12/06/16   Gildardo Cranker, DO  Lancets [facility] ULTRASOFT) lancets Check blood sugar 2-3 times daily as directed 11/01/15   [provider]  levofloxacin (LEVAQUIN) 250 MG tablet Take 1 tablet (250 mg total) by mouth daily. Patient not taking: Reported on 07/25/2017 07/03/17   Gildardo Cranker, DO  meclizine (ANTIVERT) 25 MG tablet Take 1 tablet (25 mg total) by mouth 3 (three) times daily as needed for dizziness. 12/06/16   Gildardo Cranker, DO  nitroGLYCERIN (NITROSTAT) 0.4 MG SL tablet Take one tablet under the tongue every 5 minutes as needed for chest pain 09/24/13   [name], NP  [facility] VERIO test strip Use to test blood sugar three times daily. Dx: E11.00 07/19/16   Gildardo Cranker, DO  sodium chloride (OCEAN) 0.65 % SOLN nasal spray Place 1 spray into both nostrils 3 (three) times daily as needed (for sinus congestion.). RINSE AS NEEDED FOR SINUS CONGESTION     [provider]  Spacer/Aero-Holding Chambers (AEROCHAMBER PLUS FLO-VU LARGE) MISC 1 each by Other route once. 11/04/15   Lysbeth Penner, FNP  SPS 15 GM/60ML suspension TAKE 120 MLS (30 G TOTAL) BY MOUTH ONCE FOR 1 DOSE Patient not taking: Reported on 07/25/2017 06/12/17   Gildardo Cranker, DO    Family History Family History  Problem Relation Age of Onset  . Breast cancer Mother 34  . Heart disease Mother   . Throat cancer Father   . Hypertension Father   . Arthritis Father   . Diabetes Father   . Arthritis Sister   . Obesity Sister   . Diabetes Sister   . Heart disease Cousin     . Colon cancer Neg Hx   . Stomach cancer Neg Hx   . Esophageal cancer Neg Hx     Social History Social History   Tobacco Use  . Smoking status: Never Smoker  . Smokeless tobacco: Never Used  Substance Use Topics  . Alcohol use: No    Alcohol/week: 0.0 oz  . Drug use: No     Allergies   Sulfonamide derivatives; Tramadol; and Aricept [donepezil hcl]   Review of Systems Review of Systems  Constitutional: Positive for diaphoresis.  Neurological: Positive for speech difficulty and weakness.  All other systems reviewed and are negative.    Physical Exam Updated Vital Signs BP (!) 156/79 (BP Location: Right Wrist)   Pulse 64   Temp (!) 97.5 F (36.4 C) (Axillary)   Resp 18   SpO2 100%   Physical Exam  Constitutional: She is oriented to person, place, and time. She appears well-developed and well-nourished. No distress.  HENT:  Head: Normocephalic and atraumatic.  Neck: Neck supple.  Cardiovascular: Normal rate, regular rhythm and normal heart sounds.  No murmur heard. Pulmonary/Chest: Effort normal and breath sounds normal. No respiratory distress.  Abdominal: Soft. She exhibits no distension. There is no tenderness.  Neurological: She is alert and oriented to person, place, and time.  Alert, oriented, thought content appropriate, able to give a coherent history. Speech is clear and goal oriented, able to follow commands.  Cranial Nerves II-XII grossly intact. 5/5 muscle strength in upper and lower extremities bilaterally including strong and equal grip strength and dorsiflexion/plantar flexion Sensory to light touch normal in all four extremities.  Normal finger-to-nose and rapid alternating movements.  Skin: Skin is warm and dry.  Nursing note and vitals reviewed.    ED Treatments /  Results  Labs (all labs ordered are listed, but only abnormal results are displayed) Labs Reviewed  CBC WITH DIFFERENTIAL/PLATELET - Abnormal; Notable for the following  components:      Result Value   WBC 12.8 (*)    RBC 3.52 (*)    Hemoglobin 8.8 (*)    HCT 30.0 (*)    MCH 25.0 (*)    MCHC 29.3 (*)    RDW 16.3 (*)    Neutro Abs 8.9 (*)    All other components within normal limits  COMPREHENSIVE METABOLIC PANEL - Abnormal; Notable for the following components:   Potassium 6.4 (*)    Glucose, Bld 108 (*)    BUN 31 (*)    Creatinine, Ser 1.50 (*)    Total Protein 8.8 (*)    Albumin 3.1 (*)    GFR calc non Af Amer 32 (*)    GFR calc Af Amer 37 (*)    Anion gap 4 (*)    All other components within normal limits  URINALYSIS, ROUTINE W REFLEX MICROSCOPIC - Abnormal; Notable for the following components:   Hgb urine dipstick SMALL (*)    Squamous Epithelial / LPF 0-5 (*)    All other components within normal limits  POTASSIUM - Abnormal; Notable for the following components:   Potassium 7.1 (*)    All other components within normal limits  CBG MONITORING, ED - Abnormal; Notable for the following components:   Glucose-Capillary 62 (*)    All other components within normal limits  CBG MONITORING, ED - Abnormal; Notable for the following components:   Glucose-Capillary 220 (*)    All other components within normal limits  CBG MONITORING, ED - Abnormal; Notable for the following components:   Glucose-Capillary 156 (*)    All other components within normal limits  URINE CULTURE    EKG  EKG Interpretation  Date/Time:  Wednesday July 25 2017 19:20:45 EDT Ventricular Rate:  63 PR Interval:    QRS Duration: 101 QT Interval:  457 QTC Calculation: 468 R Axis:   -16 Text Interpretation:  Sinus rhythm Borderline left axis deviation Abnormal inferior Q waves Baseline wander in lead(s) V3 No significant change since last tracing Confirmed by Blanchie Dessert [phone]) on 07/25/2017 7:28:29 PM       Radiology Dg Chest 2 View  Result Date: 07/25/2017 CLINICAL DATA:  Confusion for the past 4 days. History of diabetes, CAD, CHF and hypertension.  EXAM: CHEST - 2 VIEW COMPARISON:  07/28/2016; 07/23/2016 FINDINGS: Grossly unchanged cardiac silhouette and mediastinal contours given reduced lung volumes and patient rotation. Thickening the right paratracheal stripe is favored to be secondary to prominent vasculature. Mild pulmonary venous congestion without frank evidence of edema. Worsening perihilar opacities favored to represent atelectasis. No focal airspace opacities. No pleural effusion or pneumothorax. No acute osseus abnormalities. Degenerative change the left glenohumeral joint is suspected though incompletely evaluated. Surgical clip overlies the expected location of the gastroesophageal junction. IMPRESSION: Pulmonary venous congestion and perihilar atelectasis without definitive superimposed acute cardiopulmonary disease on this hypoventilated examination. Electronically Signed   By: Sandi Mariscal M.D.   On: 07/25/2017 18:00    Procedures Procedures (including critical care time)  CRITICAL CARE Performed by: Ozella Almond Tiera Mensinger  Total critical care time: 35 minutes  Critical care time was exclusive of separately billable procedures and treating other patients.  Critical care was necessary to treat or prevent imminent or life-threatening deterioration.  Critical care was time spent personally by me on the  following activities: development of treatment plan with patient and/or surrogate as well as nursing, discussions with consultants, evaluation of patient's response to treatment, examination of patient, obtaining history from patient or surrogate, ordering and performing treatments and interventions, ordering and review of laboratory studies, ordering and review of radiographic studies, pulse oximetry and re-evaluation of patient's condition.   Medications Ordered in ED Medications  sodium polystyrene (KAYEXALATE) 15 GM/60ML suspension 30 g (not administered)  sodium chloride 0.9 % bolus 500 mL (0 mLs Intravenous Stopped 07/25/17  2119)  sodium bicarbonate injection 50 mEq (50 mEq Intravenous Given 07/25/17 2133)  albuterol (PROVENTIL) (2.5 MG/3ML) 0.083% nebulizer solution 10 mg (10 mg Nebulization Given 07/25/17 2203)  calcium gluconate 1 g in sodium chloride 0.9 % 100 mL IVPB (1 g Intravenous New Bag/Given 07/25/17 2150)     Initial Impression / Assessment and Plan / ED Course  I have reviewed the triage vital signs and the nursing notes.  Pertinent labs & imaging results that were available during my care of the patient were reviewed by me and considered in my medical decision making (see chart for details).    KINAYA HILLIKER is a 83 y.o. female who presents to ED for hypoglycemia and weakness this morning.  Patient and family do report that she has felt weak over the last 4 days, but this morning, became sweaty and was slurring speech.  CBG of 30 by EMS.  Given oral dextrose and symptoms resolved.  She was alert and oriented x4 upon ER arrival.  No focal neuro deficits.  Given sandwich and ginger ale.  Repeat CBG 220.  Checked again several hours later and CBG 156.  UA without any signs of infection.  Baseline anemia.  Mild leukocytosis of 12.8.  CMP reviewed showing potassium of 6.4.  Creatinine of 1.5 which is just barely above her baseline.  Potassium repeated to ensure this is accurate.  Repeat value of 7.1.  No hemolysis visible on either sample.  EKG with no significant change from previous.  Given calcium, bicarb, albuterol for hyperkalemia.  Meds reviewed.  No medications on list that I am aware of to cause hyperkalemia.  Patient denies any history of similar in the past.  She is not on potassium supplementation.  Hospitalist consulted who will admit.  Patient seen by and discussed with Dr. Maryan Rued who agrees with treatment plan.   Final Clinical Impressions(s) / ED Diagnoses   Final diagnoses:  Weakness  Hyperkalemia  Hypoglycemia    ED Discharge Orders    None       Lorelie Biermann, Ozella Almond,  PA-C 07/25/17 2320    Blanchie Dessert, MD 07/29/17 2016

## 2017-07-26 ENCOUNTER — Encounter (HOSPITAL_COMMUNITY): Payer: Self-pay | Admitting: *Deleted

## 2017-07-26 ENCOUNTER — Other Ambulatory Visit: Payer: Self-pay

## 2017-07-26 DIAGNOSIS — Z794 Long term (current) use of insulin: Secondary | ICD-10-CM | POA: Diagnosis not present

## 2017-07-26 DIAGNOSIS — E162 Hypoglycemia, unspecified: Secondary | ICD-10-CM | POA: Diagnosis not present

## 2017-07-26 DIAGNOSIS — E1142 Type 2 diabetes mellitus with diabetic polyneuropathy: Secondary | ICD-10-CM | POA: Diagnosis present

## 2017-07-26 DIAGNOSIS — E11649 Type 2 diabetes mellitus with hypoglycemia without coma: Secondary | ICD-10-CM | POA: Diagnosis present

## 2017-07-26 DIAGNOSIS — R531 Weakness: Secondary | ICD-10-CM | POA: Diagnosis not present

## 2017-07-26 DIAGNOSIS — E785 Hyperlipidemia, unspecified: Secondary | ICD-10-CM | POA: Diagnosis present

## 2017-07-26 DIAGNOSIS — E875 Hyperkalemia: Secondary | ICD-10-CM

## 2017-07-26 DIAGNOSIS — I252 Old myocardial infarction: Secondary | ICD-10-CM | POA: Diagnosis not present

## 2017-07-26 DIAGNOSIS — Z66 Do not resuscitate: Secondary | ICD-10-CM | POA: Diagnosis present

## 2017-07-26 DIAGNOSIS — Z853 Personal history of malignant neoplasm of breast: Secondary | ICD-10-CM | POA: Diagnosis not present

## 2017-07-26 DIAGNOSIS — M109 Gout, unspecified: Secondary | ICD-10-CM | POA: Diagnosis present

## 2017-07-26 DIAGNOSIS — I5032 Chronic diastolic (congestive) heart failure: Secondary | ICD-10-CM | POA: Diagnosis present

## 2017-07-26 DIAGNOSIS — G4733 Obstructive sleep apnea (adult) (pediatric): Secondary | ICD-10-CM | POA: Diagnosis present

## 2017-07-26 DIAGNOSIS — E1122 Type 2 diabetes mellitus with diabetic chronic kidney disease: Secondary | ICD-10-CM | POA: Diagnosis present

## 2017-07-26 DIAGNOSIS — D638 Anemia in other chronic diseases classified elsewhere: Secondary | ICD-10-CM | POA: Diagnosis present

## 2017-07-26 DIAGNOSIS — I2510 Atherosclerotic heart disease of native coronary artery without angina pectoris: Secondary | ICD-10-CM | POA: Diagnosis present

## 2017-07-26 DIAGNOSIS — F0390 Unspecified dementia without behavioral disturbance: Secondary | ICD-10-CM | POA: Diagnosis present

## 2017-07-26 DIAGNOSIS — E119 Type 2 diabetes mellitus without complications: Secondary | ICD-10-CM | POA: Diagnosis not present

## 2017-07-26 DIAGNOSIS — Z86711 Personal history of pulmonary embolism: Secondary | ICD-10-CM | POA: Diagnosis not present

## 2017-07-26 DIAGNOSIS — N183 Chronic kidney disease, stage 3 (moderate): Secondary | ICD-10-CM | POA: Diagnosis present

## 2017-07-26 DIAGNOSIS — Z86718 Personal history of other venous thrombosis and embolism: Secondary | ICD-10-CM | POA: Diagnosis not present

## 2017-07-26 DIAGNOSIS — Z6841 Body Mass Index (BMI) 40.0 and over, adult: Secondary | ICD-10-CM | POA: Diagnosis not present

## 2017-07-26 DIAGNOSIS — F329 Major depressive disorder, single episode, unspecified: Secondary | ICD-10-CM | POA: Diagnosis present

## 2017-07-26 DIAGNOSIS — I130 Hypertensive heart and chronic kidney disease with heart failure and stage 1 through stage 4 chronic kidney disease, or unspecified chronic kidney disease: Secondary | ICD-10-CM | POA: Diagnosis present

## 2017-07-26 DIAGNOSIS — I509 Heart failure, unspecified: Secondary | ICD-10-CM | POA: Diagnosis not present

## 2017-07-26 DIAGNOSIS — R627 Adult failure to thrive: Secondary | ICD-10-CM | POA: Diagnosis present

## 2017-07-26 DIAGNOSIS — K219 Gastro-esophageal reflux disease without esophagitis: Secondary | ICD-10-CM | POA: Diagnosis present

## 2017-07-26 DIAGNOSIS — G8929 Other chronic pain: Secondary | ICD-10-CM | POA: Diagnosis present

## 2017-07-26 HISTORY — DX: Hyperkalemia: E87.5

## 2017-07-26 LAB — BASIC METABOLIC PANEL
Anion gap: 10 (ref 5–15)
Anion gap: 9 (ref 5–15)
BUN: 31 mg/dL — ABNORMAL HIGH (ref 6–20)
BUN: 31 mg/dL — ABNORMAL HIGH (ref 6–20)
CO2: 23 mmol/L (ref 22–32)
CO2: 25 mmol/L (ref 22–32)
Calcium: 9 mg/dL (ref 8.9–10.3)
Calcium: 8.8 mg/dL — ABNORMAL LOW (ref 8.9–10.3)
Chloride: 108 mmol/L (ref 101–111)
Chloride: 106 mmol/L (ref 101–111)
Creatinine, Ser: 1.5 mg/dL — ABNORMAL HIGH (ref 0.44–1.00)
Creatinine, Ser: 1.58 mg/dL — ABNORMAL HIGH (ref 0.44–1.00)
GFR calc Af Amer: 37 mL/min — ABNORMAL LOW (ref >60)
GFR calc Af Amer: 35 mL/min — ABNORMAL LOW (ref >60)
GFR calc non Af Amer: 32 mL/min — ABNORMAL LOW (ref >60)
GFR calc non Af Amer: 30 mL/min — ABNORMAL LOW (ref >60)
Glucose, Bld: 139 mg/dL — ABNORMAL HIGH (ref 65–99)
Glucose, Bld: 226 mg/dL — ABNORMAL HIGH (ref 65–99)
Potassium: 6 mmol/L — ABNORMAL HIGH (ref 3.5–5.1)
Potassium: 5.5 mmol/L — ABNORMAL HIGH (ref 3.5–5.1)
Sodium: 141 mmol/L (ref 135–145)
Sodium: 140 mmol/L (ref 135–145)

## 2017-07-26 LAB — GLUCOSE, CAPILLARY
Glucose-Capillary: 99 mg/dL (ref 65–99)
Glucose-Capillary: 139 mg/dL — ABNORMAL HIGH (ref 65–99)
Glucose-Capillary: 125 mg/dL — ABNORMAL HIGH (ref 65–99)
Glucose-Capillary: 136 mg/dL — ABNORMAL HIGH (ref 65–99)
Glucose-Capillary: 167 mg/dL — ABNORMAL HIGH (ref 65–99)

## 2017-07-26 LAB — POTASSIUM: Potassium: 6 mmol/L — ABNORMAL HIGH (ref 3.5–5.1)

## 2017-07-26 MED ORDER — DULOXETINE HCL 60 MG PO CPEP
60.0000 mg | ORAL_CAPSULE | Freq: Every day | ORAL | Status: DC
  Administered 2017-07-26 – 2017-07-28 (×3): 60 mg via ORAL
  Filled 2017-07-26 (×3): qty 1

## 2017-07-26 MED ORDER — RIVAROXABAN 10 MG PO TABS
10.0000 mg | ORAL_TABLET | Freq: Every day | ORAL | Status: DC
  Administered 2017-07-26 – 2017-07-28 (×3): 10 mg via ORAL
  Filled 2017-07-26 (×3): qty 1

## 2017-07-26 MED ORDER — HYDROXYZINE HCL 10 MG PO TABS
10.0000 mg | ORAL_TABLET | Freq: Three times a day (TID) | ORAL | Status: DC | PRN
  Administered 2017-07-27: 10 mg via ORAL
  Filled 2017-07-26 (×2): qty 1

## 2017-07-26 MED ORDER — ESTRADIOL 0.1 MG/GM VA CREA
1.0000 | TOPICAL_CREAM | Freq: Every day | VAGINAL | Status: DC
  Administered 2017-07-26: 1 via VAGINAL
  Filled 2017-07-26: qty 42.5

## 2017-07-26 MED ORDER — OXYCODONE-ACETAMINOPHEN 10-325 MG PO TABS
1.0000 | ORAL_TABLET | Freq: Four times a day (QID) | ORAL | Status: DC
Start: 2017-07-26 — End: 2017-07-26

## 2017-07-26 MED ORDER — SODIUM POLYSTYRENE SULFONATE 15 GM/60ML PO SUSP
15.0000 g | ORAL | Status: AC
  Administered 2017-07-26 (×3): 15 g via ORAL
  Filled 2017-07-26 (×3): qty 60

## 2017-07-26 MED ORDER — ONDANSETRON HCL 4 MG/2ML IJ SOLN: 4.0000 mg | Freq: Four times a day (QID) | INTRAMUSCULAR | Status: DC | PRN

## 2017-07-26 MED ORDER — ALBUTEROL SULFATE (2.5 MG/3ML) 0.083% IN NEBU
2.5000 mg | INHALATION_SOLUTION | RESPIRATORY_TRACT | Status: DC
  Administered 2017-07-26 (×2): 2.5 mg via RESPIRATORY_TRACT
  Filled 2017-07-26 (×3): qty 3

## 2017-07-26 MED ORDER — INSULIN ASPART 100 UNIT/ML ~~LOC~~ SOLN
0.0000 [IU] | Freq: Three times a day (TID) | SUBCUTANEOUS | Status: DC
  Administered 2017-07-27 – 2017-07-28 (×3): 2 [IU] via SUBCUTANEOUS

## 2017-07-26 MED ORDER — FEBUXOSTAT 40 MG PO TABS
40.0000 mg | ORAL_TABLET | Freq: Every day | ORAL | Status: DC
  Administered 2017-07-26 – 2017-07-28 (×3): 40 mg via ORAL
  Filled 2017-07-26 (×3): qty 1

## 2017-07-26 MED ORDER — OXYCODONE-ACETAMINOPHEN 5-325 MG PO TABS
1.0000 | ORAL_TABLET | Freq: Four times a day (QID) | ORAL | Status: DC
Start: 2017-07-26 — End: 2017-07-28
  Administered 2017-07-26 – 2017-07-28 (×5): 1 via ORAL
  Filled 2017-07-26 (×7): qty 1

## 2017-07-26 MED ORDER — CLONAZEPAM 1 MG PO TABS
2.0000 mg | ORAL_TABLET | Freq: Every day | ORAL | Status: DC
  Administered 2017-07-26 – 2017-07-27 (×2): 2 mg via ORAL
  Filled 2017-07-26 (×2): qty 2

## 2017-07-26 MED ORDER — LORATADINE 10 MG PO TABS
10.0000 mg | ORAL_TABLET | Freq: Every day | ORAL | Status: DC
  Administered 2017-07-26 – 2017-07-28 (×3): 10 mg via ORAL
  Filled 2017-07-26 (×3): qty 1

## 2017-07-26 MED ORDER — ACETAMINOPHEN 650 MG RE SUPP: 650.0000 mg | Freq: Four times a day (QID) | RECTAL | Status: DC | PRN

## 2017-07-26 MED ORDER — NITROGLYCERIN 0.4 MG SL SUBL: 0.4000 mg | SUBLINGUAL_TABLET | SUBLINGUAL | Status: DC | PRN

## 2017-07-26 MED ORDER — SODIUM CHLORIDE 0.9% FLUSH
3.0000 mL | Freq: Two times a day (BID) | INTRAVENOUS | Status: DC
  Administered 2017-07-26 – 2017-07-28 (×6): 3 mL via INTRAVENOUS

## 2017-07-26 MED ORDER — INSULIN ASPART 100 UNIT/ML ~~LOC~~ SOLN
3.0000 [IU] | Freq: Three times a day (TID) | SUBCUTANEOUS | Status: DC
  Administered 2017-07-27 – 2017-07-28 (×4): 3 [IU] via SUBCUTANEOUS

## 2017-07-26 MED ORDER — OXYCODONE HCL 5 MG PO TABS
5.0000 mg | ORAL_TABLET | Freq: Four times a day (QID) | ORAL | Status: DC
Start: 2017-07-26 — End: 2017-07-28
  Administered 2017-07-26 – 2017-07-28 (×5): 5 mg via ORAL
  Filled 2017-07-26 (×7): qty 1

## 2017-07-26 MED ORDER — ISOSORBIDE MONONITRATE ER 60 MG PO TB24
60.0000 mg | ORAL_TABLET | Freq: Every day | ORAL | Status: DC
  Administered 2017-07-26 – 2017-07-28 (×3): 60 mg via ORAL
  Filled 2017-07-26 (×3): qty 1

## 2017-07-26 MED ORDER — ACETAMINOPHEN 325 MG PO TABS: 650.0000 mg | ORAL_TABLET | Freq: Four times a day (QID) | ORAL | Status: DC | PRN

## 2017-07-26 MED ORDER — MECLIZINE HCL 25 MG PO TABS: 25.0000 mg | ORAL_TABLET | Freq: Three times a day (TID) | ORAL | Status: DC | PRN

## 2017-07-26 MED ORDER — MEMANTINE HCL 10 MG PO TABS
5.0000 mg | ORAL_TABLET | Freq: Every day | ORAL | Status: DC
  Administered 2017-07-26 – 2017-07-28 (×3): 5 mg via ORAL
  Filled 2017-07-26 (×3): qty 1

## 2017-07-26 MED ORDER — SODIUM CHLORIDE 0.9% FLUSH: 3.0000 mL | INTRAVENOUS | Status: DC | PRN

## 2017-07-26 MED ORDER — SALINE SPRAY 0.65 % NA SOLN: 1.0000 | Freq: Three times a day (TID) | NASAL | Status: DC | PRN

## 2017-07-26 MED ORDER — METOPROLOL SUCCINATE ER 25 MG PO TB24
25.0000 mg | ORAL_TABLET | Freq: Every day | ORAL | Status: DC
  Administered 2017-07-26 – 2017-07-28 (×3): 25 mg via ORAL
  Filled 2017-07-26 (×3): qty 1

## 2017-07-26 MED ORDER — DOCUSATE SODIUM 100 MG PO CAPS: 100.0000 mg | ORAL_CAPSULE | Freq: Every day | ORAL | Status: DC | PRN

## 2017-07-26 MED ORDER — SODIUM CHLORIDE 0.9 % IV SOLN: 250.0000 mL | INTRAVENOUS | Status: DC | PRN

## 2017-07-26 MED ORDER — MOMETASONE FURO-FORMOTEROL FUM 200-5 MCG/ACT IN AERO
2.0000 | INHALATION_SPRAY | Freq: Two times a day (BID) | RESPIRATORY_TRACT | Status: DC
  Administered 2017-07-26 – 2017-07-28 (×5): 2 via RESPIRATORY_TRACT
  Filled 2017-07-26: qty 8.8

## 2017-07-26 MED ORDER — ONDANSETRON HCL 4 MG PO TABS: 4.0000 mg | ORAL_TABLET | Freq: Four times a day (QID) | ORAL | Status: DC | PRN

## 2017-07-26 MED ORDER — PANTOPRAZOLE SODIUM 40 MG PO TBEC
40.0000 mg | DELAYED_RELEASE_TABLET | Freq: Every day | ORAL | Status: DC
  Administered 2017-07-26 – 2017-07-28 (×3): 40 mg via ORAL
  Filled 2017-07-26 (×3): qty 1

## 2017-07-26 MED ORDER — ANASTROZOLE 1 MG PO TABS
1.0000 mg | ORAL_TABLET | Freq: Every day | ORAL | Status: DC
  Administered 2017-07-26 – 2017-07-28 (×3): 1 mg via ORAL
  Filled 2017-07-26 (×3): qty 1

## 2017-07-26 MED ORDER — INSULIN ASPART 100 UNIT/ML ~~LOC~~ SOLN: 10.0000 [IU] | Freq: Two times a day (BID) | SUBCUTANEOUS | Status: DC

## 2017-07-26 MED ORDER — PRAVASTATIN SODIUM 20 MG PO TABS
20.0000 mg | ORAL_TABLET | Freq: Every day | ORAL | Status: DC
  Administered 2017-07-26 – 2017-07-27 (×2): 20 mg via ORAL
  Filled 2017-07-26 (×2): qty 1

## 2017-07-26 NOTE — H&P (Signed)
[facility]                                                                                    Patient Demographics  Kimla Furth, is a 83 y.o. female  CSN: [id number]  MRN: [id number]  DOB - 15-May-1934  Admit Date - 07/25/2017  Outpatient Primary MD for the patient is Gildardo Cranker, DO   With History of -  Past Medical History:  Diagnosis Date  . Allergy    takes Mucinex daily as needed  . Anemia, unspecified   . Anxiety    takes Clonazepam daily as needed  . Arthritis   . CHF (congestive heart failure) (HCC)    takes Furosemide daily  . Coronary artery disease    a. s/p IMI 2004 tx with BMS to RCA;  b. s/p Promus DES to RCA 2/12 (cath: LM 20-30%, pLAD 20-30%, mLAD 50%, RI 30%, pRCA 60%, mRCA 90% - tx with PCI);  c. myoview 1/07: EF 49%, inf scar, no isch  . Coronary atherosclerosis of native coronary artery   . Depression    takes Cymbalta daily  . Diabetes mellitus    insulin daily  . Dysuria   . GERD (gastroesophageal reflux disease)    takes Protonix daily  . Gout, unspecified    takes Colchicine daily  . Headache    occasionally  . History of blood clots about 51yrs ago   in legs-takes Coumadin   . Hyperlipidemia    takes Pravastatin daily  . Hypertension    takes Imdur and Metoprolol daily  . Insomnia   . Joint pain   . Joint swelling   . Myocardial infarction ([place]) 2004  . Numbness   . Obstructive sleep apnea   . Osteoarthritis   . Osteoarthritis   . Osteoarthrosis, unspecified whether generalized or localized, lower leg   . Pain, chronic   . Polymyalgia rheumatica ([place])   . Pulmonary emboli ([place]) 9/13   felt to need lifelong anticoagulation  . Shortness of breath dyspnea    with exertion and has Albuterol inhaler prn  . Type II or unspecified type diabetes mellitus without mention of complication, uncontrolled   . Urinary incontinence    takes Linzess daily  . Vertigo    hx of;was taking Meclizine if needed      Past  Surgical History:  Procedure Laterality Date  . ABDOMINAL HYSTERECTOMY     partial  . APPENDECTOMY    . blood clots/legs and lungs  2013  . BREAST BIOPSY Left 07/22/2014  . BREAST BIOPSY Left 02/10/2013  . BREAST LUMPECTOMY Left 11/05/2014  . BREAST LUMPECTOMY WITH RADIOACTIVE SEED LOCALIZATION Left 11/05/2014   Procedure: LEFT BREAST LUMPECTOMY WITH RADIOACTIVE SEED LOCALIZATION;  Surgeon: [name], MD;  Location: [place];  Service: General;  Laterality: Left;  . CARDIAC CATHETERIZATION    . COLONOSCOPY    . CORONARY ANGIOPLASTY  2  . ESOPHAGOGASTRODUODENOSCOPY (EGD) WITH PROPOFOL N/A 11/07/2016   Procedure: ESOPHAGOGASTRODUODENOSCOPY (EGD) WITH PROPOFOL;  Surgeon: Gatha Mayer, MD;  Location: [facility];  Service: Endoscopy;  Laterality: N/A;  . EXCISION OF SKIN TAG Right 11/05/2014   Procedure: EXCISION  OF RIGHT EYELID SKIN TAG;  Surgeon: [name], MD;  Location: [place];  Service: General;  Laterality: Right;  . EYE SURGERY Bilateral    cataract   . GASTRIC BYPASS  1977    reversed in 1979, [place] N/A 06/29/2014   Procedure: LEFT HEART CATHETERIZATION WITH CORONARY ANGIOGRAM;  Surgeon: Troy Sine, MD;  Location: [facility] CATH LAB;  Service: Cardiovascular;  Laterality: N/A;  . MI with stent placement  2004    in for   Chief Complaint  Patient presents with  . Hypoglycemia     HPI  Yohana Bartha  is a 83 y.o. female, with past medical history significant for diabetes mellitus on long-term insulin who presented today with a hypoglycemic episode.  Patient has been taking her insulin regularly although she has been having a decreased appetite.  Patient denies vomiting but feels nauseated.  She has been eating cereal lately.  In the emergency room the patient was found to be hyperkalemic and her potassium was repeated.  Patient denies any chest pains palpitations but has been feeling dizzy lately.    Review  of Systems    In addition to the HPI above,  No Fever, No Headache, No changes with Vision or hearing, No problems swallowing food or Liquids, No Chest pain, Cough or Shortness of Breath, No Abdominal pain, No Nausea or Vommitting, Bowel movements are regular, No Blood in stool or Urine, No dysuria, No new skin rashes or bruises, No new joints pains-aches,  No new weakness, tingling, numbness in any extremity, No recent weight gain or loss,   A full 10 point Review of Systems was done, except as stated above, all other Review of Systems were negative.   Social History Social History   Tobacco Use  . Smoking status: Never Smoker  . Smokeless tobacco: Never Used  Substance Use Topics  . Alcohol use: No    Alcohol/week: 0.0 oz     Family History Family History  Problem Relation Age of Onset  . Breast cancer Mother 60  . Heart disease Mother   . Throat cancer Father   . Hypertension Father   . Arthritis Father   . Diabetes Father   . Arthritis Sister   . Obesity Sister   . Diabetes Sister   . Heart disease Cousin   . Colon cancer Neg Hx   . Stomach cancer Neg Hx   . Esophageal cancer Neg Hx      Prior to Admission medications   Medication Sig Start Date End Date Taking? Authorizing Provider  anastrozole (ARIMIDEX) 1 MG tablet Take 1 tablet (1 mg total) by mouth daily. 09/20/16  Yes [name], MD  clonazePAM (KLONOPIN) 1 MG tablet Take 2 tablets (2 mg total) by mouth at bedtime. For anxiety 06/08/17  Yes Gildardo Cranker, DO  DULoxetine (CYMBALTA) 60 MG capsule Take one capsule by mouth once daily for anxiety 07/25/17  Yes Eulas Post, Monica, DO  febuxostat (ULORIC) 40 MG tablet Take 1 tablet (40 mg total) by mouth daily. 07/25/17  Yes Eulas Post, Monica, DO  insulin aspart (NOVOLOG FLEXPEN) 100 UNIT/ML FlexPen Inject 34 Units into the skin 2 (two) times daily. 06/18/17  Yes Eulas Post, Monica, DO  Insulin Glargine (TOUJEO SOLOSTAR) 300 UNIT/ML SOPN Inject 70 Units into the  skin 2 (two) times daily. (before breakfast and before supper) 10/16/16  Yes Eulas Post, Monica, DO  isosorbide mononitrate (IMDUR) 60 MG 24 hr tablet Take  one tablet by mouth once daily 07/25/17  Yes Eulas Post, Monica, DO  losartan (COZAAR) 50 MG tablet Take 1 tablet (50 mg total) by mouth daily. 06/15/17  Yes Gildardo Cranker, DO  memantine [facility]) 5 MG tablet Take 2 tabs po every AM and 1 tab po every PM for memory 06/08/17  Yes Gildardo Cranker, DO  metoprolol succinate (TOPROL-XL) 25 MG 24 hr tablet Take one tablet by mouth once daily for blood pressure 04/23/17  Yes Gildardo Cranker, DO  oxyCODONE-acetaminophen (PERCOCET) 10-325 MG tablet Take 1 tablet by mouth every 6 (six) hours. 07/19/17  Yes Eulas Post, Monica, DO  pantoprazole (PROTONIX) 40 MG tablet Take 1 tablet (40 mg total) by mouth daily. 02/22/17  Yes Gildardo Cranker, DO  pravastatin (PRAVACHOL) 20 MG tablet Take one tablet by mouth once daily for cholesterol 07/25/17  Yes Eulas Post, [place], DO  rivaroxaban (XARELTO) 10 MG TABS tablet Take 1 tablet (10 mg total) by mouth daily. 06/12/17  Yes Eulas Post, Monica, DO  albuterol (PROVENTIL HFA;VENTOLIN HFA) 108 (90 Base) MCG/ACT inhaler Inhale 1-2 puffs into the lungs every 6 (six) hours as needed for wheezing or shortness of breath. 03/15/16   Robyn Haber, MD  budesonide-formoterol (SYMBICORT) 160-4.5 MCG/ACT inhaler Inhale 2 puffs into the lungs 2 (two) times daily. For bronchitis Patient taking differently: Inhale 2 puffs into the lungs 2 (two) times daily as needed (sob and wheezing). For bronchitis 06/18/17   Gildardo Cranker, DO  cetirizine (ZYRTEC) 10 MG tablet Take 10 mg by mouth at bedtime as needed for allergies.     [provider]  docusate sodium (COLACE) 100 MG capsule Take 100 mg by mouth daily as needed for mild constipation. TAKE PER BOTTLE AS NEEDED FOR CONSTIPATION     [provider]  estradiol (ESTRACE) 0.1 MG/GM vaginal cream Use every other evening use in vaginal area 02/06/17    [provider]  hydrOXYzine (ATARAX/VISTARIL) 10 MG tablet Take 1 tablet (10 mg total) by mouth 3 (three) times daily as needed for itching. 12/06/16   Gildardo Cranker, DO  Lancets [facility] ULTRASOFT) lancets Check blood sugar 2-3 times daily as directed 11/01/15   [provider]  levofloxacin (LEVAQUIN) 250 MG tablet Take 1 tablet (250 mg total) by mouth daily. Patient not taking: Reported on 07/25/2017 07/03/17   Gildardo Cranker, DO  meclizine (ANTIVERT) 25 MG tablet Take 1 tablet (25 mg total) by mouth 3 (three) times daily as needed for dizziness. 12/06/16   Gildardo Cranker, DO  nitroGLYCERIN (NITROSTAT) 0.4 MG SL tablet Take one tablet under the tongue every 5 minutes as needed for chest pain 09/24/13   [name], NP  [facility] VERIO test strip Use to test blood sugar three times daily. Dx: E11.00 07/19/16   Gildardo Cranker, DO  sodium chloride (OCEAN) 0.65 % SOLN nasal spray Place 1 spray into both nostrils 3 (three) times daily as needed (for sinus congestion.). RINSE AS NEEDED FOR SINUS CONGESTION     [provider]  Spacer/Aero-Holding Chambers (AEROCHAMBER PLUS FLO-VU LARGE) MISC 1 each by Other route once. 11/04/15   Lysbeth Penner, FNP  SPS 15 GM/60ML suspension TAKE 120 MLS (30 G TOTAL) BY MOUTH ONCE FOR 1 DOSE Patient not taking: Reported on 07/25/2017 06/12/17   Gildardo Cranker, DO    Allergies  Allergen Reactions  . Sulfonamide Derivatives Swelling    Mouth swelling  . Tramadol Nausea And Vomiting  . Aricept [Donepezil Hcl]     GI upset/loose stools  Physical Exam  Vitals  Blood pressure (!) 142/47, pulse 85, temperature (!) 97.5 F (36.4 C), temperature source Axillary, resp. rate 18, SpO2 100 %.   1. General well-developed, well-nourished female, extremely pleasant  2. Normal affect and insight, Not Suicidal or Homicidal, Awake Alert, Oriented X 3.  3. No F.N deficits, grossly, patient moving all extremities.  4. Ears and Eyes appear  Normal, Conjunctivae clear, PERRLA. Moist Oral Mucosa.  5. Supple Neck, No JVD, No cervical lymphadenopathy appriciated, No Carotid Bruits.  6. Symmetrical Chest wall movement, Good air movement bilaterally, CTAB.  7. RRR, No Gallops, Rubs or Murmurs, No Parasternal Heave.  8. Positive Bowel Sounds, Abdomen Soft, Non tender, No organomegaly appriciated,No rebound -guarding or rigidity.  9.  No Cyanosis, Normal Skin Turgor, No Skin Rash or Bruise.  10. Good muscle tone,  joints appear normal , no effusions, Normal ROM.    Data Review  CBC Recent Labs  Lab 07/25/17 1815  WBC 12.8*  HGB 8.8*  HCT 30.0*  PLT 380  MCV 85.2  MCH 25.0*  MCHC 29.3*  RDW 16.3*  LYMPHSABS 2.8  MONOABS 0.9  EOSABS 0.2  BASOSABS 0.0   ------------------------------------------------------------------------------------------------------------------  Chemistries  Recent Labs  Lab 07/25/17 1815 07/25/17 1926  NA 137  --   K 6.4* 7.1*  CL 107  --   CO2 26  --   GLUCOSE 108*  --   BUN 31*  --   CREATININE 1.50*  --   CALCIUM 9.0  --   AST 23  --   ALT 14  --   ALKPHOS 83  --   BILITOT 0.4  --    ------------------------------------------------------------------------------------------------------------------ CrCl cannot be calculated (Unknown ideal weight.). ------------------------------------------------------------------------------------------------------------------ No results for input(s): TSH, T4TOTAL, T3FREE, THYROIDAB in the last 72 hours.  Invalid input(s): FREET3   Coagulation profile No results for input(s): INR, PROTIME in the last 168 hours. ------------------------------------------------------------------------------------------------------------------- No results for input(s): DDIMER in the last 72 hours. -------------------------------------------------------------------------------------------------------------------  Cardiac Enzymes No results for input(s):  CKMB, TROPONINI, MYOGLOBIN in the last 168 hours.  Invalid input(s): CK ------------------------------------------------------------------------------------------------------------------ Invalid input(s): POCBNP   ---------------------------------------------------------------------------------------------------------------  Urinalysis    Component Value Date/Time   COLORURINE YELLOW 07/25/2017 1921   APPEARANCEUR CLEAR 07/25/2017 1921   LABSPEC 1.012 07/25/2017 1921   PHURINE 5.0 07/25/2017 1921   GLUCOSEU NEGATIVE 07/25/2017 1921   HGBUR SMALL (A) 07/25/2017 1921   BILIRUBINUR NEGATIVE 07/25/2017 1921   BILIRUBINUR Neg 06/29/2017 0908   KETONESUR NEGATIVE 07/25/2017 1921   PROTEINUR NEGATIVE 07/25/2017 1921   UROBILINOGEN 0.2 06/29/2017 0908   UROBILINOGEN 0.2 03/15/2016 1722   NITRITE NEGATIVE 07/25/2017 1921   LEUKOCYTESUR NEGATIVE 07/25/2017 1921    ----------------------------------------------------------------------------------------------------------------   Imaging results:   Dg Chest 2 View  Result Date: 07/25/2017 CLINICAL DATA:  Confusion for the past 4 days. History of diabetes, CAD, CHF and hypertension. EXAM: CHEST - 2 VIEW COMPARISON:  07/28/2016; 07/23/2016 FINDINGS: Grossly unchanged cardiac silhouette and mediastinal contours given reduced lung volumes and patient rotation. Thickening the right paratracheal stripe is favored to be secondary to prominent vasculature. Mild pulmonary venous congestion without frank evidence of edema. Worsening perihilar opacities favored to represent atelectasis. No focal airspace opacities. No pleural effusion or pneumothorax. No acute osseus abnormalities. Degenerative change the left glenohumeral joint is suspected though incompletely evaluated. Surgical clip overlies the expected location of the gastroesophageal junction. IMPRESSION: Pulmonary venous congestion and perihilar atelectasis without definitive superimposed acute  cardiopulmonary disease on this hypoventilated examination. Electronically  Signed   By: Sandi Mariscal M.D.   On: 07/25/2017 18:00    My personal review of EKG: Rhythm NSR, 63 bpm/hyper acute T waves noted    Assessment & Plan  1.  Hyperkalemia:      Status post calcium       Kayexalate      Albuterol      Sodium bicarb      Hold Cozaar      Check potassium level at 2 AM then at 5 AM  2.  Hypoglycemia : Decreased p.o. intake with patient being on insulin      Decrease NPH to 15 twice daily  3.  UTI treated; 2 weeks ago  4.  History of DVTs on Xarelto 5.  History of coronary artery disease  DVT Prophylaxis Xarelto  AM Labs Ordered, also please review Full Orders    Code Status full  Disposition Plan: Home  Time spent in minutes : 43 minutes  Condition GUARDED   @SIGNATURE @

## 2017-07-26 NOTE — Progress Notes (Signed)
Spoke with patient. She states that she has been having low blood sugars off and on since on the Toujeo. Was diagnosed with diabetes about 40 years ago. Patient was having to go to the bathroom when there, so could not talk to her for long. Will continue to monitor blood sugars while in the hospital and will need follow up with PCP at discharge.   Harvel Ricks RN BSN CDE Diabetes Coordinator Pager: [phone]  8am-5pm

## 2017-07-26 NOTE — Progress Notes (Signed)
Results for SAYDA, GRABLE (MRN [id number]) as of 07/26/2017 10:54  Ref. Range 11/07/2016 11:58 07/25/2017 17:19 07/25/2017 18:11 07/25/2017 21:52 07/26/2017 07:35  Glucose-Capillary Latest Ref Range: 65 - 99 mg/dL 92 62 (L) 220 (H) 156 (H) 99  Noted that blood sugars have been less than 100 mg/dl. Noted that insulin orders for Novolog 10 units twice a day with breakfast and supper do not specify if patient eats at least 50 % of meal.  Physician needs to specify in order if Novolog 10 units BID is to be given as a meal coverage dosage.   Harvel Ricks RN BSN CDE Diabetes Coordinator Pager: [phone]  8am-5pm

## 2017-07-26 NOTE — Progress Notes (Signed)
Patient is admitted this am due to hypoglycemia and hyperkalemia. Improving.  Case discussed with patient and family at bedside.

## 2017-07-27 ENCOUNTER — Inpatient Hospital Stay (HOSPITAL_COMMUNITY): Payer: PPO

## 2017-07-27 DIAGNOSIS — N183 Chronic kidney disease, stage 3 (moderate): Secondary | ICD-10-CM

## 2017-07-27 DIAGNOSIS — I509 Heart failure, unspecified: Secondary | ICD-10-CM

## 2017-07-27 DIAGNOSIS — E119 Type 2 diabetes mellitus without complications: Secondary | ICD-10-CM

## 2017-07-27 DIAGNOSIS — Z794 Long term (current) use of insulin: Secondary | ICD-10-CM

## 2017-07-27 LAB — BASIC METABOLIC PANEL
Anion gap: 8 (ref 5–15)
BUN: 37 mg/dL — ABNORMAL HIGH (ref 6–20)
CO2: 24 mmol/L (ref 22–32)
Calcium: 8.6 mg/dL — ABNORMAL LOW (ref 8.9–10.3)
Chloride: 105 mmol/L (ref 101–111)
Creatinine, Ser: 1.83 mg/dL — ABNORMAL HIGH (ref 0.44–1.00)
GFR calc Af Amer: 29 mL/min — ABNORMAL LOW (ref >60)
GFR calc non Af Amer: 25 mL/min — ABNORMAL LOW (ref >60)
Glucose, Bld: 249 mg/dL — ABNORMAL HIGH (ref 65–99)
Potassium: 5.8 mmol/L — ABNORMAL HIGH (ref 3.5–5.1)
Sodium: 137 mmol/L (ref 135–145)

## 2017-07-27 LAB — HEMOGLOBIN A1C
Hgb A1c MFr Bld: 6.7 % — ABNORMAL HIGH (ref 4.8–5.6)
Mean Plasma Glucose: 145.59 mg/dL

## 2017-07-27 LAB — URINE CULTURE

## 2017-07-27 LAB — CBC WITH DIFFERENTIAL/PLATELET
Basophils Absolute: 0 10*3/uL (ref 0.0–0.1)
Basophils Relative: 0 %
Eosinophils Absolute: 0.3 10*3/uL (ref 0.0–0.7)
Eosinophils Relative: 3 %
HCT: 26.1 % — ABNORMAL LOW (ref 36.0–46.0)
Hemoglobin: 7.7 g/dL — ABNORMAL LOW (ref 12.0–15.0)
Lymphocytes Relative: 29 %
Lymphs Abs: 3.5 10*3/uL (ref 0.7–4.0)
MCH: 25 pg — ABNORMAL LOW (ref 26.0–34.0)
MCHC: 29.5 g/dL — ABNORMAL LOW (ref 30.0–36.0)
MCV: 84.7 fL (ref 78.0–100.0)
Monocytes Absolute: 0.7 10*3/uL (ref 0.1–1.0)
Monocytes Relative: 6 %
Neutro Abs: 7.8 10*3/uL — ABNORMAL HIGH (ref 1.7–7.7)
Neutrophils Relative %: 62 %
Platelets: 323 10*3/uL (ref 150–400)
RBC: 3.08 MIL/uL — ABNORMAL LOW (ref 3.87–5.11)
RDW: 16.3 % — ABNORMAL HIGH (ref 11.5–15.5)
WBC: 12.4 10*3/uL — ABNORMAL HIGH (ref 4.0–10.5)

## 2017-07-27 LAB — GLUCOSE, CAPILLARY
Glucose-Capillary: 178 mg/dL — ABNORMAL HIGH (ref 65–99)
Glucose-Capillary: 179 mg/dL — ABNORMAL HIGH (ref 65–99)
Glucose-Capillary: 107 mg/dL — ABNORMAL HIGH (ref 65–99)
Glucose-Capillary: 201 mg/dL — ABNORMAL HIGH (ref 65–99)

## 2017-07-27 LAB — ECHOCARDIOGRAM COMPLETE
Height: 67 in
Weight: 5601.45 oz

## 2017-07-27 MED ORDER — LIVING WELL WITH DIABETES BOOK
Freq: Once | Status: AC
  Administered 2017-07-27: 18:00:00
  Filled 2017-07-27: qty 1

## 2017-07-27 MED ORDER — HYDRALAZINE HCL 10 MG PO TABS
10.0000 mg | ORAL_TABLET | Freq: Three times a day (TID) | ORAL | Status: DC
  Administered 2017-07-27 – 2017-07-28 (×3): 10 mg via ORAL
  Filled 2017-07-27 (×3): qty 1

## 2017-07-27 MED ORDER — SODIUM BICARBONATE 8.4 % IV SOLN
50.0000 meq | Freq: Once | INTRAVENOUS | Status: DC
  Filled 2017-07-27: qty 50

## 2017-07-27 MED ORDER — GABAPENTIN 100 MG PO CAPS
100.0000 mg | ORAL_CAPSULE | Freq: Every day | ORAL | Status: DC
  Administered 2017-07-27: 100 mg via ORAL
  Filled 2017-07-27: qty 1

## 2017-07-27 MED ORDER — SODIUM BICARBONATE 8.4 % IV SOLN
50.0000 meq | Freq: Once | INTRAVENOUS | Status: AC
  Administered 2017-07-27: 50 meq via INTRAVENOUS
  Filled 2017-07-27: qty 50

## 2017-07-27 MED ORDER — SODIUM POLYSTYRENE SULFONATE PO POWD
30.0000 g | Freq: Once | ORAL | Status: AC
  Administered 2017-07-27: 30 g via ORAL
  Filled 2017-07-27: qty 30

## 2017-07-27 MED ORDER — SODIUM POLYSTYRENE SULFONATE 15 GM/60ML PO SUSP
30.0000 g | Freq: Once | ORAL | Status: AC
  Administered 2017-07-27: 30 g via ORAL
  Filled 2017-07-27: qty 120

## 2017-07-27 MED ORDER — INSULIN GLARGINE 100 UNIT/ML ~~LOC~~ SOLN
20.0000 [IU] | Freq: Every day | SUBCUTANEOUS | Status: DC
Start: 2017-07-27 — End: 2017-07-28
  Administered 2017-07-27: 20 [IU] via SUBCUTANEOUS
  Filled 2017-07-27 (×3): qty 0.2

## 2017-07-27 NOTE — Evaluation (Signed)
Occupational Therapy Evaluation Patient Details Name: [name] MRN: [id number] DOB: 1934-10-29 Today's Date: 07/27/2017    History of Present Illness [name]  is a 83 y.o. female, with past medical history significant for diabetes mellitus on long-term insulin who presented today with a hypoglycemic episode, in ED pt was found to be hyperkalemic    Clinical Impression   Pt was admitted for the above. At baseline, pt needs assistance with adls.  Pt has tremor and this interferes with ability to use AE, but she does want to increase independence with adls. Will follow pt focusing on increasing activity tolerance and maximizing participation in ADLs as able.    Follow Up Recommendations  Supervision/Assistance - 24 hour;Home health OT(noted pt is declining snf)    Equipment Recommendations  3 in 1 bedside commode(wide, if she doesn't have this)    Recommendations for Other Services       Precautions / Restrictions Precautions Precautions: Fall Restrictions Weight Bearing Restrictions: No      Mobility Bed Mobility               General bed mobility comments: oob  Transfers Overall transfer level: Needs assistance Equipment used: Rolling walker (2 wheeled) Transfers: Sit to/from Stand Sit to Stand: Min assist         General transfer comment: did not want to stand with OT; states she got up a little while ago    Balance Overall balance assessment: Needs assistance;History of Falls(pt admits to recent near falls)   Sitting balance-Leahy Scale: Good       Standing balance-Leahy Scale: Poor Standing balance comment: reliant on UE support                           ADL either performed or assessed with clinical judgement   ADL Overall ADL's : Needs assistance/impaired Eating/Feeding: Set up;Sitting   Grooming: Minimal assistance;Sitting   Upper Body Bathing: Minimal assistance;Bed level;With adaptive equipment   Lower Body Bathing:  Maximal assistance;With adaptive equipment;Sitting/lateral leans   Upper Body Dressing : Maximal assistance;Sitting   Lower Body Dressing: Total assistance;Sit to/from stand                 General ADL Comments: pt states she would like to be able to take care of herself. She was unable to successfully use reacher or sock aide. She did simulate washing under arms and legs with long sponge.  Pt states that tremor doesn't interfere with self feeding. Educated that she can rest elbows on table to see if this would control some of the tremor.      Vision         Perception     Praxis      Pertinent Vitals/Pain Pain Assessment: Faces Faces Pain Scale: Hurts little more Pain Location: R side(states she can't get comfortable ) Pain Descriptors / Indicators: Sore Pain Intervention(s): Limited activity within patient's tolerance;Monitored during session     Hand Dominance     Extremity/Trunk Assessment Upper Extremity Assessment Upper Extremity Assessment: Generalized weakness(has tremor (R dominant) R shoulder 90; L 80)   Lower Extremity Assessment Lower Extremity Assessment: Generalized weakness       Communication Communication Communication: No difficulties   Cognition Arousal/Alertness: Awake/alert Behavior During Therapy: WFL for tasks assessed/performed Overall Cognitive Status: Within Functional Limits for tasks assessed  General Comments       Exercises General Exercises - Lower Extremity Ankle Circles/Pumps: AROM;Both;5 reps Long Arc Quad: AROM;Both;5 reps   Shoulder Instructions      Home Living Family/patient expects to be discharged to:: Private residence Living Arrangements: Children Available Help at Discharge: Family Type of Home: House             Bathroom Shower/Tub: Walk-in Corporate treasurer Toilet: Standard     Home Equipment: Environmental consultant - 4 wheels;Wheelchair - manual;Shower seat -  built in;Bedside commode   Additional Comments: ? BSC, pt states she doesn't have anything to raise toilet      Prior Functioning/Environment      ADL's / Homemaking Assistance Needed: assist with bathing, dressing--dtr assists   Comments: pt has tremors; has reacher but really can't use it for dressing. Needs help.  Able to feed herself despite tremor        OT Problem List: Decreased strength;Decreased activity tolerance;Decreased knowledge of use of DME or AE;Pain;Impaired UE functional use;Decreased coordination(balance per PT notes)      OT Treatment/Interventions: Self-care/ADL training;DME and/or AE instruction;Patient/family education;Balance training;Therapeutic activities;Energy conservation    OT Goals(Current goals can be found in the care plan section) Acute Rehab OT Goals Patient Stated Goal: lose weight again OT Goal Formulation: With patient Time For Goal Achievement: 08/10/17 Potential to Achieve Goals: Good ADL Goals Pt Will Transfer to Toilet: with min guard assist;stand pivot transfer;bedside commode Additional ADL Goal #1: pt will tolerate standing x 1 minute with min guard for adls Additional ADL Goal #2: pt will initiate at least one rest break for energy conservation  OT Frequency: Min 2X/week   Barriers to D/C:            Co-evaluation              AM-PAC PT "6 Clicks" Daily Activity     Outcome Measure Help from another person eating meals?: A Little Help from another person taking care of personal grooming?: A Little Help from another person toileting, which includes using toliet, bedpan, or urinal?: A Lot Help from another person bathing (including washing, rinsing, drying)?: A Lot Help from another person to put on and taking off regular upper body clothing?: A Little Help from another person to put on and taking off regular lower body clothing?: Total 6 Click Score: 14   End of Session    Activity Tolerance: Patient limited by  fatigue Patient left: in chair;with call bell/phone within reach;with family/visitor present  OT Visit Diagnosis: Muscle weakness (generalized) (M62.81)                Time: 1610-9604 OT Time Calculation (min): 20 min Charges:  OT General Charges $OT Visit: 1 Visit OT Evaluation $OT Eval Moderate Complexity: 1 Mod G-Codes:     Lesle Chris, OTR/L [phone] 07/27/2017  Dunya Meiners 07/27/2017, 2:53 PM

## 2017-07-27 NOTE — Progress Notes (Addendum)
PROGRESS NOTE  [name] WUJ:[id number] DOB: 02/15/1935 DOA: 07/25/2017 PCP: Gildardo Cranker, DO  HPI/Recap of past 24 hours:  Feeling better, blood sugar improved k remain elevated, she did not have bm since am kayexalate this am  She reports numbness /tingling in hands and feet  Daughter at bedside  Assessment/Plan: Active Problems:   Hyperkalemia   Insulin dependent dm2 Presented with Hypoglycemia Patient reports she has been dieting, eating less. She started to have hypoglycemia in the last 2-3 months a1c 6.7 Continue adjust insulin   Hyperkalemia Likely multifactorial D/c cozaar, start low k diet, continue kayexalate, sodium bicarb Repeat lab in am  HTN D/c cozaar.  continue lopressor, start imdur, hydralazine, continue to titrate  ckdIII ua no infection, no blood Renal US no acute findings Renal dosing meds  Anemia of chronic disease hgb7 Recent b12/iron study unremarkable (done in 03/2017) FOBT pending collection  H/o DVT on xarelto  H/o breast cancer on anastrole  Dementia , memory impairment, recently started on namenda a months ago She is aaox3, immediate recall 2/3.  Chronic pain: continue home meds prn percocet Peripheral neuropathy, start low dose neurontin  She states she has not OSA morbid obesity Body mass index is 54.83 kg/m.   FTT: has been mostly in bed, have falls at home.   Code Status: DNR, confirmed with patient with daughter in room  Family Communication: patient and daughter at bedside  Disposition Plan: snf vs HH in 1-2 days   Consultants:  none  Procedures:  none  Antibiotics:  none   Objective: BP (!) 142/51 (BP Location: Left Wrist)   Pulse 95   Temp 98.5 F (36.9 C) (Oral)   Resp (!) 22   Ht 5\' 7"  (1.702 m)   Wt (!) 158.8 kg (350 lb 1.5 oz)   SpO2 96%   BMI 54.83 kg/m   Intake/Output Summary (Last 24 hours) at 07/27/2017 0751 Last data filed at 07/27/2017 0600 Gross per 24 hour  Intake  840 ml  Output 0 ml  Net 840 ml   Filed Weights   07/26/17 0316  Weight: (!) 158.8 kg (350 lb 1.5 oz)    Exam: Patient is examined daily including today on 07/27/2017, exams remain the same as of yesterday except that has changed    General:  NAD, obese  Cardiovascular: RRR  Respiratory: CTABL  Abdomen: Soft/ND/NT, positive BS  Musculoskeletal: No Edema  Neuro: alert, oriented x3, with impaired memory  Data Reviewed: Basic Metabolic Panel: Recent Labs  Lab 07/25/17 1815 07/25/17 1926 07/26/17 0531 07/26/17 0923 07/27/17 0516  NA 137  --  141 140 137  K 6.4* 7.1* 6.0*  6.0* 5.5* 5.8*  CL 107  --  108 106 105  CO2 26  --  23 25 24   GLUCOSE 108*  --  139* 226* 249*  BUN 31*  --  31* 31* 37*  CREATININE 1.50*  --  1.50* 1.58* 1.83*  CALCIUM 9.0  --  9.0 8.8* 8.6*   Liver Function Tests: Recent Labs  Lab 07/25/17 1815  AST 23  ALT 14  ALKPHOS 83  BILITOT 0.4  PROT 8.8*  ALBUMIN 3.1*   No results for input(s): LIPASE, AMYLASE in the last 168 hours. No results for input(s): AMMONIA in the last 168 hours. CBC: Recent Labs  Lab 07/25/17 1815 07/27/17 0516  WBC 12.8* 12.4*  NEUTROABS 8.9* 7.8*  HGB 8.8* 7.7*  HCT 30.0* 26.1*  MCV 85.2 84.7  PLT 380 323  Cardiac Enzymes:   No results for input(s): CKTOTAL, CKMB, CKMBINDEX, TROPONINI in the last 168 hours. BNP (last 3 results) No results for input(s): BNP in the last 8760 hours.  ProBNP (last 3 results) No results for input(s): PROBNP in the last 8760 hours.  CBG: Recent Labs  Lab 07/26/17 1203 07/26/17 1259 07/26/17 1636 07/26/17 2134 07/27/17 0733  GLUCAP 139* 125* 136* 167* 178*    No results found for this or any previous visit (from the past 240 hour(s)).   Studies: No results found.  Scheduled Meds: . anastrozole  1 mg Oral Daily  . clonazePAM  2 mg Oral QHS  . DULoxetine  60 mg Oral Daily  . estradiol  1 Applicatorful Vaginal Daily  . febuxostat  40 mg Oral Daily  . insulin  aspart  0-9 Units Subcutaneous TID WC  . insulin aspart  3 Units Subcutaneous TID WC  . isosorbide mononitrate  60 mg Oral Daily  . loratadine  10 mg Oral Daily  . memantine  5 mg Oral Daily  . metoprolol succinate  25 mg Oral Daily  . mometasone-formoterol  2 puff Inhalation BID  . oxyCODONE-acetaminophen  1 tablet Oral Q6H   And  . oxyCODONE  5 mg Oral Q6H  . pantoprazole  40 mg Oral Daily  . pravastatin  20 mg Oral q1800  . rivaroxaban  10 mg Oral Daily  . sodium bicarbonate  50 mEq Intravenous Once  . sodium chloride flush  3 mL Intravenous Q12H  . sodium polystyrene  30 g Oral Once    Continuous Infusions: . sodium chloride       Time spent: 19mins I have personally reviewed and interpreted on  07/27/2017 daily labs, tele strips, imagings as discussed above under date review session and assessment and plans.  I reviewed all nursing notes, pharmacy notes, vitals, pertinent old records  I have discussed plan of care as described above with RN , patient and family on 07/27/2017   Florencia Reasons MD, PhD  [facility] Pager [phone]. If 7PM-7AM, please contact night-coverage at [email or web address], password [facility] 07/27/2017, 7:51 AM  LOS: 1 day

## 2017-07-27 NOTE — Care Management Note (Signed)
Case Management Note  Patient Details  Name: JENNA ROUTZAHN MRN: [id number] Date of Birth: 18-Mar-1935  Subjective/Objective: PT recc HH vs SNF. Spoke to patient in rm-she declines SNF,wants home w/HHC-used [facility] in past will use again-rep Santiago Glad aware of HHPT,aide.                    Action/Plan:d/c home w/HHC.   Expected Discharge Date:                  Expected Discharge Plan:  [place]  In-House Referral:  Clinical Social Work  Discharge planning Services  CM Consult  Post Acute Care Choice:  Durable Medical Equipment(rw,w/c) Choice offered to:  Patient  DME Arranged:    DME Agency:     HH Arranged:  PT, Nurse's Aide [place] Agency:  [place]  Status of Service:  In process, will continue to follow  If discussed at Long Length of Stay Meetings, dates discussed:    Additional Comments:  Dessa Phi, RN 07/27/2017, 12:43 PM

## 2017-07-27 NOTE — Evaluation (Signed)
Physical Therapy Evaluation Patient Details Name: [name] MRN: [id number] DOB: [birth year] Today's Date: 07/27/2017   History of Present Illness  [name]  is a 83 y.o. female, with past medical history significant for diabetes mellitus on long-term insulin who presented today with a hypoglycemic episode, in ED pt was found to be hyperkalemic   Clinical Impression  Pt admitted with above diagnosis. Pt currently with functional limitations due to the deficits listed below (see PT Problem List). Pt reports limited mobility at her baseline, recently has been moving more since she was getting HHPT prior to admission; Pt is very fatigued today and requires excessive amount of time to stand, she cannot tol standing more than 10sec. Would benefit from SNF if agreeable, will follow in acute setting; Pt will benefit from skilled PT to increase their independence and safety with mobility to allow discharge to the venue listed below.       Follow Up Recommendations Home health PT;SNF(depending on progress)    Equipment Recommendations  None recommended by PT    Recommendations for Other Services       Precautions / Restrictions Precautions Precautions: Fall Restrictions Weight Bearing Restrictions: No      Mobility  Bed Mobility               General bed mobility comments: pt received in chair  Transfers Overall transfer level: Needs assistance Equipment used: Rolling walker (2 wheeled) Transfers: Sit to/from Stand Sit to Stand: Min assist         General transfer comment: incr time, assist to rise and control descent  Ambulation/Gait             General Gait Details: unable d/t fatigue  Stairs            Wheelchair Mobility    Modified Rankin (Stroke Patients Only)       Balance Overall balance assessment: Needs assistance;History of Falls(pt admits to recent near falls)   Sitting balance-Leahy Scale: Good       Standing balance-Leahy  Scale: Poor Standing balance comment: reliant on UE support                             Pertinent Vitals/Pain Pain Assessment: No/denies pain    Home Living Family/patient expects to be discharged to:: Private residence Living Arrangements: Children Available Help at Discharge: Family Type of Home: House Home Access: Level entry     Home Layout: Two level Home Equipment: Environmental consultant - 4 wheels;Wheelchair - [employer];Shower seat - built in      Prior Function Level of Independence: Needs assistance   Gait / Transfers Assistance Needed: assist to get in and out of bed;  amb with rollator short distances, bed to bathroom, uses w/c  "from bed to car"  ADL's / Homemaking Assistance Needed: assist with bathing, dressing--dtr assists  Comments: pt admits to staying in bed much of the time, gets up when the PT comes to work with her; pt is discusses her unhealthy eating habits which are enabled by her dtr as the pt cannot shop and cook for herself     Hand Dominance        Extremity/Trunk Assessment   Upper Extremity Assessment Upper Extremity Assessment: Generalized weakness    Lower Extremity Assessment Lower Extremity Assessment: Generalized weakness       Communication   Communication: No difficulties  Cognition Arousal/Alertness: Awake/alert Behavior During Therapy: [facility] for  tasks assessed/performed Overall Cognitive Status: Within Functional Limits for tasks assessed                                        General Comments      Exercises General Exercises - Lower Extremity Ankle Circles/Pumps: AROM;Both;5 reps Long Arc Quad: AROM;Both;5 reps   Assessment/Plan    PT Assessment Patient needs continued PT services  PT Problem List Decreased strength;Decreased activity tolerance;Decreased balance;Decreased mobility;Obesity       PT Treatment Interventions DME instruction;Therapeutic activities;Functional mobility  training;Gait training;Therapeutic exercise;Patient/family education;Balance training    PT Goals (Current goals can be found in the Care Plan section)  Acute Rehab PT Goals Patient Stated Goal: lose weight again PT Goal Formulation: With patient Time For Goal Achievement: 08/10/17 Potential to Achieve Goals: Good    Frequency Min 3X/week   Barriers to discharge        Co-evaluation               AM-PAC PT "6 Clicks" Daily Activity  Outcome Measure Difficulty turning over in bed (including adjusting bedclothes, sheets and blankets)?: Unable Difficulty moving from lying on back to sitting on the side of the bed? : Unable Difficulty sitting down on and standing up from a chair with arms (e.g., wheelchair, bedside commode, etc,.)?: Unable Help needed moving to and from a bed to chair (including a wheelchair)?: A Lot Help needed walking in hospital room?: Total Help needed climbing 3-5 steps with a railing? : Total 6 Click Score: 7    End of Session   Activity Tolerance: Patient limited by fatigue Patient left: in chair;with call bell/phone within reach   PT Visit Diagnosis: Muscle weakness (generalized) (M62.81);Difficulty in walking, not elsewhere classified (R26.2)    Time: 1030-1100 PT Time Calculation (min) (ACUTE ONLY): 30 min   Charges:   PT Evaluation $PT Eval Low Complexity: 1 Low PT Treatments $Therapeutic Activity: 8-22 mins   PT G CodesKenyon Ana, PT Pager: [phone] 07/27/2017   [facility] 07/27/2017, 11:42 AM

## 2017-07-27 NOTE — Progress Notes (Addendum)
Nutrition Education Note  Patient with PMH significant for DM, CHF, HTN, HLD, and CAD. Presents this admission with hypoglycemic episode. RD consulted for persistent high potassium prior to this admission and during.   RD provided "Low Potassium Foods" and "High Potassium Foods" handouts from the Academy of Nutrition and Dietetics. Explained why diet restrictions are needed and provided lists of foods to limit/avoid that are high potassium. Provided specific recommendations on safer alternatives of these foods. Strongly encouraged compliance of this diet.   Addendum: Diabetes Coordinator contacted this RD regarding questions pt had about weight loss. Provided pt's caregiver with "Weight Loss Tips" Handout. Emphasized the importance of serving sizes and provided examples of correct portions of common foods. Discussed importance of controlled and consistent intake throughout the day. Provided examples of ways to balance meals/snacks and encouraged intake of high-fiber, whole grain complex carbohydrates.   Teach back method used.  Expect good compliance.  Body mass index is 54.83 kg/m. Pt meets criteria for morbidly obese based on current BMI.  Current diet order is renal carbohydrate modified. Labs and medications reviewed. No further nutrition interventions warranted at this time. RD contact information provided. If additional nutrition issues arise, please re-consult RD.  Mariana Single RD, LDN Clinical Nutrition Pager # [phone]

## 2017-07-27 NOTE — Progress Notes (Signed)
*  PRELIMINARY RESULTS* Echocardiogram 2D Echocardiogram has been performed.  Samuel Germany 07/27/2017, 2:06 PM

## 2017-07-27 NOTE — Progress Notes (Signed)
OT Cancellation Note  Patient Details Name: [name] MRN: [id number] DOB: Jul 04, 1934   Cancelled Treatment:    Reason Eval/Treat Not Completed: Other (comment). Pt just got back to bed; will return  [place] 07/27/2017, 11:31 AM  Lesle Chris, OTR/L [phone] 07/27/2017

## 2017-07-27 NOTE — Care Management Note (Signed)
Case Management Note  Patient Details  Name: [name] MRN: [id number] Date of Birth: [birth year]  Subjective/Objective: 83 y/o f admitted w/hyperkalemia. From home. CM referral for [place].PT cons-await recc.                   Action/Plan:d/c plan home.   Expected Discharge Date:                  Expected Discharge Plan:  Home/Self Care  In-House Referral:  Clinical Social Work  Discharge planning Services  CM Consult  Post Acute Care Choice:    Choice offered to:     DME Arranged:    DME Agency:     HH Arranged:    HH Agency:     Status of Service:  In process, will continue to follow  If discussed at Long Length of Stay Meetings, dates discussed:    Additional Comments:  Dessa Phi, RN 07/27/2017, 10:53 AM

## 2017-07-27 NOTE — Progress Notes (Signed)
   07/27/17 1600  OT Visit Information  Last OT Received On 07/27/17  Assistance Needed +2  History of Present Illness [name]  is a 83 y.o. female, with past medical history significant for diabetes mellitus on long-term insulin who presented today with a hypoglycemic episode, in ED pt was found to be hyperkalemic   Precautions  Precautions Fall  Pain Assessment  Pain Assessment Faces  Faces Pain Scale 4  Pain Location R side  Pain Descriptors / Indicators Sore  Pain Intervention(s) Limited activity within patient's tolerance;Monitored during session;Repositioned  Cognition  Arousal/Alertness Awake/alert  Behavior During Therapy WFL for tasks assessed/performed  Overall Cognitive Status Within Functional Limits for tasks assessed  ADL  Toilet Transfer Minimal assistance (simulated chair to bed:  HHA)  General ADL Comments Assisted pt in positioning on R side with pillow behind back and between her legs. She was able to relieve pain in R side.  Bed Mobility  Overal bed mobility Needs Assistance  Bed Mobility Sit to Supine  Sit to supine Min assist;Mod assist  General bed mobility comments pt needs assistance with both legs  Restrictions  Weight Bearing Restrictions No  Transfers  Equipment used 1 person hand held assist  Sit to Stand Min assist  General transfer comment Pt did not want to use RW.  Hand held assistance given.  Two trials of sit to stand to get balance and extra time.  Will encourage RW for increased stability  OT - End of Session  Activity Tolerance Patient limited by fatigue  Patient left in bed;with call bell/phone within reach;with family/visitor present  OT Assessment/Plan  OT Visit Diagnosis Muscle weakness (generalized) (M62.81)  Follow Up Recommendations Supervision/Assistance - 24 hour;Home health OT  OT Equipment 3 in 1 bedside commode (wide, if she doesn't have this)  AM-PAC OT "6 Clicks" Daily Activity Outcome Measure  Help from another person  eating meals? 3  Help from another person taking care of personal grooming? 3  Help from another person toileting, which includes using toliet, bedpan, or urinal? 2  Help from another person bathing (including washing, rinsing, drying)? 2  Help from another person to put on and taking off regular upper body clothing? 3  Help from another person to put on and taking off regular lower body clothing? 1  6 Click Score 14  ADL G Code Conversion CK  OT Goal Progression  Progress towards OT goals Progressing toward goals  OT Time Calculation  OT Start Time (ACUTE ONLY) 1530  OT Stop Time (ACUTE ONLY) 1539  OT Time Calculation (min) 9 min  OT General Charges  $OT Visit 1 Visit  OT Treatments  $Therapeutic Activity 8-22 mins  [place], OTR/L [phone] 07/27/2017

## 2017-07-27 NOTE — Progress Notes (Signed)
Spoke with patient about her diabetes. Was diagnosed about 40 years ago. Had gastric bypass surgery at [facility] in 1977, but it was reversed 2 years later. Has struggled with weight much of her life. Has been on large amounts of insulin and [place] for about 1 year. States that she has been having low blood sugars over the last 2-3 months. Hence, her HgbA1c is 6.7%. Only checks blood sugars once a day. Does not eat much except for cereal, cookies, diet soda, and chips.  Explained the "plate method" to her and encouraged adding healthy foods to eat that she might like. She cannot eat much due to her dental capacity.   Ordered Living Well with Diabetes booklet. Spoke with dietician, Carly, about some different meal or food suggestions for weight loss. [facility] the [facility] 2 years. Will need less insulin at discharge than she has been on at home and would benefit by  checking blood sugars at least twice a day.   Harvel Ricks RN BSN CDE Diabetes Coordinator Pager: [phone]  8am-5pm

## 2017-07-28 LAB — CBC WITH DIFFERENTIAL/PLATELET
Basophils Absolute: 0 10*3/uL (ref 0.0–0.1)
Basophils Relative: 0 %
Eosinophils Absolute: 0.4 10*3/uL (ref 0.0–0.7)
Eosinophils Relative: 3 %
HCT: 27 % — ABNORMAL LOW (ref 36.0–46.0)
Hemoglobin: 7.9 g/dL — ABNORMAL LOW (ref 12.0–15.0)
Lymphocytes Relative: 33 %
Lymphs Abs: 3.6 10*3/uL (ref 0.7–4.0)
MCH: 25.2 pg — ABNORMAL LOW (ref 26.0–34.0)
MCHC: 29.3 g/dL — ABNORMAL LOW (ref 30.0–36.0)
MCV: 86 fL (ref 78.0–100.0)
Monocytes Absolute: 0.6 10*3/uL (ref 0.1–1.0)
Monocytes Relative: 5 %
Neutro Abs: 6.5 10*3/uL (ref 1.7–7.7)
Neutrophils Relative %: 59 %
Platelets: 317 10*3/uL (ref 150–400)
RBC: 3.14 MIL/uL — ABNORMAL LOW (ref 3.87–5.11)
RDW: 16.3 % — ABNORMAL HIGH (ref 11.5–15.5)
WBC: 11.1 10*3/uL — ABNORMAL HIGH (ref 4.0–10.5)

## 2017-07-28 LAB — GLUCOSE, CAPILLARY
Glucose-Capillary: 98 mg/dL (ref 65–99)
Glucose-Capillary: 194 mg/dL — ABNORMAL HIGH (ref 65–99)

## 2017-07-28 LAB — BASIC METABOLIC PANEL
Anion gap: 9 (ref 5–15)
BUN: 32 mg/dL — ABNORMAL HIGH (ref 6–20)
CO2: 28 mmol/L (ref 22–32)
Calcium: 8.6 mg/dL — ABNORMAL LOW (ref 8.9–10.3)
Chloride: 104 mmol/L (ref 101–111)
Creatinine, Ser: 1.57 mg/dL — ABNORMAL HIGH (ref 0.44–1.00)
GFR calc Af Amer: 35 mL/min — ABNORMAL LOW (ref >60)
GFR calc non Af Amer: 30 mL/min — ABNORMAL LOW (ref >60)
Glucose, Bld: 113 mg/dL — ABNORMAL HIGH (ref 65–99)
Potassium: 4.8 mmol/L (ref 3.5–5.1)
Sodium: 141 mmol/L (ref 135–145)

## 2017-07-28 LAB — CK: Total CK: 167 U/L (ref 38–234)

## 2017-07-28 MED ORDER — INSULIN GLARGINE 300 UNIT/ML ~~LOC~~ SOPN: 35.0000 [IU] | PEN_INJECTOR | Freq: Two times a day (BID) | SUBCUTANEOUS | 0 refills | Status: DC

## 2017-07-28 MED ORDER — HYDRALAZINE HCL 10 MG PO TABS: 10.0000 mg | ORAL_TABLET | Freq: Three times a day (TID) | ORAL | 0 refills | Status: DC

## 2017-07-28 MED ORDER — GABAPENTIN 100 MG PO CAPS: 100.0000 mg | ORAL_CAPSULE | Freq: Every day | ORAL | 0 refills | Status: DC

## 2017-07-28 MED ORDER — INSULIN ASPART 100 UNIT/ML FLEXPEN: PEN_INJECTOR | SUBCUTANEOUS | 0 refills | Status: DC

## 2017-07-28 NOTE — Progress Notes (Signed)
.      Durable Medical Equipment  (From admission, onward)        Start     Ordered   07/28/17 1222  For home use only DME Hospital bed  Once    Question Answer Comment  Patient has (list medical condition): CHF, Chronic Pain, Morbid Obesity, OSA   The above medical condition requires: Patient requires the ability to reposition frequently   Head must be elevated greater than: 45 degrees   Bed type Semi-electric   [employer] Yes   Trapeze Bar Yes   Support Surface: Gel Overlay      07/28/17 1225

## 2017-07-28 NOTE — Care Management Note (Addendum)
Case Management Note  Patient Details  Name: [name] MRN: [id number] Date of Birth: [birth year]  Subjective/Objective:     DM, hx CHF, HTN               Action/Plan: NCM spoke to pt, son and dtr, [name]. Pt active with [facility] [place], RN and aide. She has RW and bedside commode at home. Requesting hospital bed for home. States getting in and out of the bed at home is difficult. Contacted [facility] for hospital bed for home.   Contacted [facility] to see if pt qualifies for bariatric bed.   Expected Discharge Date:  07/28/17               Expected Discharge Plan:  [place]  In-House Referral:  Clinical Social Work  Discharge planning Services  CM Consult  Post Acute Care Choice:  Durable Medical Equipment(rw,w/c) Choice offered to:  Patient  DME Arranged:  Hospital bed DME Agency:  [place]:  PT, Nurse's Aide, RN [facility] Agency:  [facility]  Status of Service:  Completed, signed off  If discussed at [employer], dates discussed:    Additional Comments:  Erenest Rasher, RN 07/28/2017, 12:30 PM

## 2017-07-28 NOTE — Progress Notes (Signed)
D/C instructions reviewed w/ pt, son, and pt's daughter [name] [name] via speaker phone). All present verbalized understanding and all questions answered. Pt d/c in w/c in stable condition in w/c by NT to son's car. Son in possession of d/c packet, script, and all personal belongings.

## 2017-07-28 NOTE — Discharge Instructions (Signed)
Hypoglycemia Hypoglycemia is when the sugar (glucose) level in the blood is too low. Symptoms of low blood sugar may include:  Feeling: ? Hungry. ? Worried or nervous (anxious). ? Sweaty and clammy. ? Confused. ? Dizzy. ? Sleepy. ? Sick to your stomach (nauseous).  Having: ? A fast heartbeat. ? A headache. ? A change in your vision. ? Jerky movements that you cannot control (seizure). ? Nightmares. ? Tingling or no feeling (numbness) around the mouth, lips, or tongue.  Having trouble with: ? Talking. ? Paying attention (concentrating). ? Moving (coordination). ? Sleeping.  Shaking.  Passing out (fainting).  Getting upset easily (irritability).  Low blood sugar can happen to people who have diabetes and people who do not have diabetes. Low blood sugar can happen quickly, and it can be an emergency. Treating Low Blood Sugar Low blood sugar is often treated by eating or drinking something sugary right away. If you can think clearly and swallow safely, follow the 15:15 rule:  Take 15 grams of a fast-acting carb (carbohydrate). Some fast-acting carbs are: ? 1 tube of glucose gel. ? 3 sugar tablets (glucose pills). ? 6-8 pieces of hard candy. ? 4 oz (120 mL) of fruit juice. ? 4 oz (120 mL) of regular (not diet) soda.  Check your blood sugar 15 minutes after you take the carb.  If your blood sugar is still at or below 70 mg/dL (3.9 mmol/L), take 15 grams of a carb again.  If your blood sugar does not go above 70 mg/dL (3.9 mmol/L) after 3 tries, get help right away.  After your blood sugar goes back to normal, eat a meal or a snack within 1 hour.  Treating Very Low Blood Sugar If your blood sugar is at or below 54 mg/dL (3 mmol/L), you have very low blood sugar (severe hypoglycemia). This is an emergency. Do not wait to see if the symptoms will go away. Get medical help right away. Call your local emergency services (911 in the [place] Do not drive yourself to the  hospital. If you have very low blood sugar and you cannot eat or drink, you may need a glucagon shot (injection). A family member or friend should learn how to check your blood sugar and how to give you a glucagon shot. Ask your doctor if you need to have a glucagon shot kit at home. Follow these instructions at home: General instructions  Avoid any diets that cause you to not eat enough food. Talk with your doctor before you start any new diet.  Take over-the-counter and prescription medicines only as told by your doctor.  Limit alcohol to no more than 1 drink per day for nonpregnant women and 2 drinks per day for men. One drink equals 12 oz of beer, 5 oz of wine, or 1 oz of hard liquor.  Keep all follow-up visits as told by your doctor. This is important. If You Have Diabetes:   Make sure you know the symptoms of low blood sugar.  Always keep a source of sugar with you, such as: ? Sugar. ? Sugar tablets. ? Glucose gel. ? Fruit juice. ? Regular soda (not diet soda). ? Milk. ? Hard candy. ? Honey.  Take your medicines as told.  Follow your exercise and meal plan. ? Eat on time. Do not skip meals. ? Follow your sick day plan when you cannot eat or drink normally. Make this plan ahead of time with your doctor.  Check your blood sugar as  often as told by your doctor. Always check before and after exercise.  Share your diabetes care plan with: ? Your work or school. ? People you live with.  Check your pee (urine) for ketones: ? When you are sick. ? As told by your doctor.  Carry a card or wear jewelry that says you have diabetes. If You Have Low Blood Sugar From Other Causes:   Check your blood sugar as often as told by your doctor.  Follow instructions from your doctor about what you cannot eat or drink. Contact a doctor if:  You have trouble keeping your blood sugar in your target range.  You have low blood sugar often. Get help right away if:  You still have  symptoms after you eat or drink something sugary.  Your blood sugar is at or below 54 mg/dL (3 mmol/L).  You have jerky movements that you cannot control.  You pass out. These symptoms may be an emergency. Do not wait to see if the symptoms will go away. Get medical help right away. Call your local emergency services (911 in the [place] Do not drive yourself to the hospital. This information is not intended to replace advice given to you by your health care provider. Make sure you discuss any questions you have with your health care provider. Document Released: 07/19/2009 Document Revised: 09/30/2015 Document Reviewed: 05/28/2015 Elsevier Interactive Patient Education  2018 [employer]. Pravastatin tablets What is this medicine? PRAVASTATIN (PRA va stat in) is known as a HMG-CoA reductase inhibitor or 'statin'. It lowers the level of cholesterol and triglycerides in the blood. This drug may also reduce the risk of heart attack, stroke, or other health problems in patients with risk factors for heart disease. Diet and lifestyle changes are often used with this drug. This medicine may be used for other purposes; ask your health care provider or pharmacist if you have questions. COMMON BRAND NAME(S): Pravachol What should I tell my health care provider before I take this medicine? They need to know if you have any of these conditions: -frequently drink alcoholic beverages -kidney disease -liver disease -muscle aches or weakness -other medical condition -an unusual or allergic reaction to pravastatin, other medicines, foods, dyes, or preservatives -pregnant or trying to get pregnant -breast-feeding How should I use this medicine? Take pravastatin tablets by mouth. Swallow the tablets with a drink of water. Pravastatin can be taken at anytime of the day, with or without food. Follow the directions on the prescription label. Take your doses at regular intervals. Do not take your medicine more  often than directed. Talk to your pediatrician regarding the use of this medicine in children. Special care may be needed. Pravastatin has been used in children as young as 83 years of age. Overdosage: If you think you have taken too much of this medicine contact a poison control center or emergency room at once. NOTE: This medicine is only for you. Do not share this medicine with others. What if I miss a dose? If you miss a dose, take it as soon as you can. If it is almost time for your next dose, take only that dose. Do not take double or extra doses. What may interact with this medicine? This medicine may interact with the following medications: -colchicine -cyclosporine -other medicines for high cholesterol -some antibiotics like azithromycin, clarithromycin, erythromycin, and telithromycin This list may not describe all possible interactions. Give your health care provider a list of all the medicines, herbs, non-prescription drugs,  or dietary supplements you use. Also tell them if you smoke, drink alcohol, or use illegal drugs. Some items may interact with your medicine. What should I watch for while using this medicine? Visit your doctor or health care professional for regular check-ups. You may need regular tests to make sure your liver is working properly. Tell your doctor or health care professional right away if you get any unexplained muscle pain, tenderness, or weakness, especially if you also have a fever and tiredness. Your doctor or health care professional may tell you to stop taking this medicine if you develop muscle problems. If your muscle problems do not go away after stopping this medicine, contact your health care professional. This drug is only part of a total heart-health program. Your doctor or a dietician can suggest a low-cholesterol and low-fat diet to help. Avoid alcohol and smoking, and keep a proper exercise schedule. Do not use this drug if you are pregnant or  breast-feeding. Serious side effects to an unborn child or to an infant are possible. Talk to your doctor or pharmacist for more information. This medicine may affect blood sugar levels. If you have diabetes, check with your doctor or health care professional before you change your diet or the dose of your diabetic medicine. If you are going to have surgery tell your health care professional that you are taking this drug. What side effects may I notice from receiving this medicine? Side effects that you should report to your doctor or health care professional as soon as possible: -allergic reactions like skin rash, itching or hives, swelling of the face, lips, or tongue -dark urine -fever -muscle pain, cramps, or weakness -redness, blistering, peeling or loosening of the skin, including inside the mouth -trouble passing urine or change in the amount of urine -unusually weak or tired -yellowing of the eyes or skin Side effects that usually do not require medical attention (report to your doctor or health care professional if they continue or are bothersome): -gas -headache -heartburn -indigestion -stomach pain This list may not describe all possible side effects. Call your doctor for medical advice about side effects. You may report side effects to FDA at 1-800-FDA-1088. Where should I keep my medicine? Keep out of the reach of children. Store at room temperature between 15 to 30 degrees C (59 to 86 degrees F). Protect from light. Keep container tightly closed. Throw away any unused medicine after the expiration date. NOTE: This sheet is a summary. It may not cover all possible information. If you have questions about this medicine, talk to your doctor, pharmacist, or health care provider.  2018 Elsevier/Gold Standard (2014-11-19 16:00:27)

## 2017-07-28 NOTE — Discharge Summary (Signed)
Discharge Summary  [name] XTA:[id number] DOB: 05/21/1934  PCP: Gildardo Cranker, DO  Admit date: 07/25/2017 Discharge date: 07/28/2017  Time spent: >33mins  Recommendations for Outpatient Follow-up:  1. F/u with PMD within a week  for hospital discharge follow up, repeat cbc/bmp at follow up. pmd to continue adjust insulin dose  Home health arranged  Discharge Diagnoses:  Active Hospital Problems   Diagnosis Date Noted  . Hyperkalemia 07/26/2017    Resolved Hospital Problems  No resolved problems to display.    Discharge Condition: stable  Diet recommendation: heart healthy/carb modified/low k diet  Filed Weights   07/26/17 0316  Weight: (!) 158.8 kg (350 lb 1.5 oz)    History of present illness: (per admitting MD Dr Laren Everts) [name]  is a 83 y.o. female, with past medical history significant for diabetes mellitus on long-term insulin who presented today with a hypoglycemic episode.  Patient has been taking her insulin regularly although she has been having a decreased appetite.  Patient denies vomiting but feels nauseated.  She has been eating cereal lately.  In the emergency room the patient was found to be hyperkalemic and her potassium was repeated.  Patient denies any chest pains palpitations but has been feeling dizzy lately.    Hospital Course:  Active Problems:   Hyperkalemia   Insulin dependent dm2 Presented with Hypoglycemia Patient reports she has been dieting, eating less. She started to have hypoglycemia in the last 2-3 months a1c 6.7 She only needed lantus 20units with ssi here in the hospital home meds trujeo dropped from 70bid to 35bid, novolog changed from 34units bid to ssi. Patient is advised to check blood glucose three time a day and follow up with pmd next week for further insulin dose adjustment.   Hyperkalemia Likely multifactorial D/c cozaar, start low k diet,  She received  Kayexalate and sodium bicarb k  normalized.  HTN D/c cozaar.  continue lopressor, start imdur, hydralazine  ckdIII ua no infection, no blood Renal US no acute findings Renal dosing meds  Anemia of chronic disease hgb7.9 Recent b12/iron study unremarkable (done in 03/2017)   H/o DVT on xarelto  H/o breast cancer on anastrole  Dementia , memory impairment, recently started on namenda a months ago She is aaox3, immediate recall 2/3.  Chronic pain: continue home meds prn percocet Peripheral neuropathy, start low dose neurontin  morbid obesity Body mass index is 54.83 kg/m. She states she does not have OSA   FTT: has been mostly in bed, have falls at home. she declined snf She has been mostly wheelchair bound for more than a year  Code Status: DNR, confirmed with patient with daughter in room  Family Communication: patient and daughter at bedside  Disposition Plan: home with [facility]    Consultants:  none  Procedures:  none  Antibiotics:  none   Discharge Exam: BP (!) 155/91 (BP Location: Right Arm)   Pulse 97   Temp 98.2 F (36.8 C) (Tympanic)   Resp 18   Ht 5\' 7"  (1.702 m)   Wt (!) 158.8 kg (350 lb 1.5 oz)   SpO2 100%   BMI 54.83 kg/m   General: NAD, obese Cardiovascular: RRR Respiratory: CTABL  Discharge Instructions You were cared for by a hospitalist during your hospital stay. If you have any questions about your discharge medications or the care you received while you were in the hospital after you are discharged, you can call the unit and asked to speak with the hospitalist  on call if the hospitalist that took care of you is not available. Once you are discharged, your primary care physician will handle any further medical issues. Please note that NO REFILLS for any discharge medications will be authorized once you are discharged, as it is imperative that you return to your primary care physician (or establish a relationship with a primary care physician if you do  not have one) for your aftercare needs so that they can reassess your need for medications and monitor your lab values.  Discharge Instructions    Diet - low sodium heart healthy   Complete by:  As directed    Carb modified, low potassium diet.   Increase activity slowly   Complete by:  As directed      Allergies as of 07/28/2017      Reactions   Sulfonamide Derivatives Swelling   Mouth swelling   Tramadol Nausea And Vomiting   Aricept [donepezil Hcl]    GI upset/loose stools      Medication List    STOP taking these medications   levofloxacin 250 MG tablet Commonly known as:  LEVAQUIN   losartan 50 MG tablet Commonly known as:  COZAAR   SPS 15 GM/60ML suspension Generic drug:  sodium polystyrene     TAKE these medications   AEROCHAMBER PLUS FLO-VU LARGE Misc 1 each by Other route once.   albuterol 108 (90 Base) MCG/ACT inhaler Commonly known as:  PROVENTIL HFA;VENTOLIN HFA Inhale 1-2 puffs into the lungs every 6 (six) hours as needed for wheezing or shortness of breath.   anastrozole 1 MG tablet Commonly known as:  ARIMIDEX Take 1 tablet (1 mg total) by mouth daily.   budesonide-formoterol 160-4.5 MCG/ACT inhaler Commonly known as:  SYMBICORT Inhale 2 puffs into the lungs 2 (two) times daily. For bronchitis What changed:    when to take this  reasons to take this  additional instructions   cetirizine 10 MG tablet Commonly known as:  ZYRTEC Take 10 mg by mouth at bedtime as needed for allergies.   clonazePAM 1 MG tablet Commonly known as:  KLONOPIN Take 2 tablets (2 mg total) by mouth at bedtime. For anxiety   docusate sodium 100 MG capsule Commonly known as:  COLACE Take 100 mg by mouth daily as needed for mild constipation. TAKE PER BOTTLE AS NEEDED FOR CONSTIPATION   DULoxetine 60 MG capsule Commonly known as:  CYMBALTA Take one capsule by mouth once daily for anxiety   estradiol 0.1 MG/GM vaginal cream Commonly known as:  ESTRACE Use every  other evening use in vaginal area   febuxostat 40 MG tablet Commonly known as:  ULORIC Take 1 tablet (40 mg total) by mouth daily.   gabapentin 100 MG capsule Commonly known as:  NEURONTIN Take 1 capsule (100 mg total) by mouth at bedtime.   hydrALAZINE 10 MG tablet Commonly known as:  APRESOLINE Take 1 tablet (10 mg total) by mouth every 8 (eight) hours.   hydrOXYzine 10 MG tablet Commonly known as:  ATARAX/VISTARIL Take 1 tablet (10 mg total) by mouth 3 (three) times daily as needed for itching.   insulin aspart 100 UNIT/ML FlexPen Commonly known as:  NOVOLOG FLEXPEN Before each meal 3 times a day, 140-199 - 2 units, 200-250 - 4 units, 251-299 - 6 units,  300-349 - 8 units,  350 or above 10 units. Insulin PEN if approved, provide syringes and needles if needed. What changed:    how much to take  how  to take this  when to take this  additional instructions   Insulin Glargine 300 UNIT/ML Sopn Commonly known as:  TOUJEO SOLOSTAR Inject 35 Units into the skin 2 (two) times daily. (before breakfast and before supper) What changed:  how much to take   isosorbide mononitrate 60 MG 24 hr tablet Commonly known as:  IMDUR Take one tablet by mouth once daily   meclizine 25 MG tablet Commonly known as:  ANTIVERT Take 1 tablet (25 mg total) by mouth 3 (three) times daily as needed for dizziness.   memantine 5 MG tablet Commonly known as:  NAMENDA Take 2 tabs po every AM and 1 tab po every PM for memory   metoprolol succinate 25 MG 24 hr tablet Commonly known as:  TOPROL-XL Take one tablet by mouth once daily for blood pressure   nitroGLYCERIN 0.4 MG SL tablet Commonly known as:  NITROSTAT Take one tablet under the tongue every 5 minutes as needed for chest pain   onetouch ultrasoft lancets Check blood sugar 2-3 times daily as directed   ONETOUCH VERIO test strip Generic drug:  glucose blood Use to test blood sugar three times daily. Dx: E11.00    oxyCODONE-acetaminophen 10-325 MG tablet Commonly known as:  PERCOCET Take 1 tablet by mouth every 6 (six) hours.   pantoprazole 40 MG tablet Commonly known as:  PROTONIX Take 1 tablet (40 mg total) by mouth daily.   pravastatin 20 MG tablet Commonly known as:  PRAVACHOL Take one tablet by mouth once daily for cholesterol   rivaroxaban 10 MG Tabs tablet Commonly known as:  XARELTO Take 1 tablet (10 mg total) by mouth daily.   sodium chloride 0.65 % Soln nasal spray Commonly known as:  OCEAN Place 1 spray into both nostrils 3 (three) times daily as needed (for sinus congestion.). RINSE AS NEEDED FOR SINUS CONGESTION      Allergies  Allergen Reactions  . Sulfonamide Derivatives Swelling    Mouth swelling  . Tramadol Nausea And Vomiting  . Aricept [Donepezil Hcl]     GI upset/loose stools   Follow-up Information    Gildardo Cranker, DO Follow up in 1 week(s).   Specialty:  Internal Medicine Why:  hospital discharge follow up. please check your blood sugar three times a day, bring in your recording for your doctor to review.please discuss with your pcp for  further insulin does adjustment Contact information: [place] [address] [phone]        Home, [facility] Follow up.   Specialty:  Home Health Services Why:  Home Health RN, Physical Therapy, and aide. agency will call to arrange initial appointment Contact information: [place] [address] [place] Follow up.   Why:  will deliver hospital bed to home Contact information: [address] [place] [address] [phone]            The results of significant diagnostics from this hospitalization (including imaging, microbiology, ancillary and laboratory) are listed below for reference.    Significant Diagnostic Studies: Dg Chest 2 View  Result Date: 07/25/2017 CLINICAL DATA:  Confusion for the past 4  days. History of diabetes, CAD, CHF and hypertension. EXAM: CHEST - 2 VIEW COMPARISON:  07/28/2016; 07/23/2016 FINDINGS: Grossly unchanged cardiac silhouette and mediastinal contours given reduced lung volumes and patient rotation. Thickening the right paratracheal stripe is favored to be secondary to  prominent vasculature. Mild pulmonary venous congestion without frank evidence of edema. Worsening perihilar opacities favored to represent atelectasis. No focal airspace opacities. No pleural effusion or pneumothorax. No acute osseus abnormalities. Degenerative change the left glenohumeral joint is suspected though incompletely evaluated. Surgical clip overlies the expected location of the gastroesophageal junction. IMPRESSION: Pulmonary venous congestion and perihilar atelectasis without definitive superimposed acute cardiopulmonary disease on this hypoventilated examination. Electronically Signed   By: Sandi Mariscal M.D.   On: 07/25/2017 18:00   US Renal  Result Date: 07/27/2017 CLINICAL DATA:  Elevated creatinine. EXAM: RENAL / URINARY TRACT ULTRASOUND COMPLETE COMPARISON:  No recent. FINDINGS: Right Kidney: Length: 11.3 cm. Echogenicity within normal limits. No hydronephrosis visualized. 4.2 cm simple and a 4.9 cm thinly septated cyst noted in the left kidney. Left Kidney: Length: 10.7 cm. Echogenicity within normal limits. No hydronephrosis visualized. 2.4 cm thinly septated cyst left kidney. Bladder: Appears normal for degree of bladder distention. IMPRESSION: 1. Prominent cysts noted in the kidneys, these have a benign appearance. 2. No acute or significant focal abnormality. No hydronephrosis or bladder distention. Size Electronically Signed   By: Marcello Moores  Register   On: 07/27/2017 13:24    Microbiology: Recent Results (from the past 240 hour(s))  Urine culture     Status: Abnormal   Collection Time: 07/25/17  7:22 PM  Result Value Ref Range Status   Specimen Description   Final    URINE,  RANDOM Performed at [facility] [address], [place], [place] [address]    Special Requests   Final    NONE Performed at [facility], [place] [address], [place], [place] [address]    Culture MULTIPLE SPECIES PRESENT, SUGGEST RECOLLECTION (A)  Final   Report Status 07/27/2017 FINAL  Final     Labs: Basic Metabolic Panel: Recent Labs  Lab 07/25/17 1815 07/25/17 1926 07/26/17 0531 07/26/17 0923 07/27/17 0516 07/28/17 0507  NA 137  --  141 140 137 141  K 6.4* 7.1* 6.0*  6.0* 5.5* 5.8* 4.8  CL 107  --  108 106 105 104  CO2 26  --  23 25 24 28   GLUCOSE 108*  --  139* 226* 249* 113*  BUN 31*  --  31* 31* 37* 32*  CREATININE 1.50*  --  1.50* 1.58* 1.83* 1.57*  CALCIUM 9.0  --  9.0 8.8* 8.6* 8.6*   Liver Function Tests: Recent Labs  Lab 07/25/17 1815  AST 23  ALT 14  ALKPHOS 83  BILITOT 0.4  PROT 8.8*  ALBUMIN 3.1*   No results for input(s): LIPASE, AMYLASE in the last 168 hours. No results for input(s): AMMONIA in the last 168 hours. CBC: Recent Labs  Lab 07/25/17 1815 07/27/17 0516 07/28/17 0507  WBC 12.8* 12.4* 11.1*  NEUTROABS 8.9* 7.8* 6.5  HGB 8.8* 7.7* 7.9*  HCT 30.0* 26.1* 27.0*  MCV 85.2 84.7 86.0  PLT 380 323 317   Cardiac Enzymes: Recent Labs  Lab 07/28/17 0507  CKTOTAL 167   BNP: BNP (last 3 results) No results for input(s): BNP in the last 8760 hours.  ProBNP (last 3 results) No results for input(s): PROBNP in the last 8760 hours.  CBG: Recent Labs  Lab 07/27/17 1128 07/27/17 1622 07/27/17 2034 07/28/17 0728 07/28/17 1120  GLUCAP 179* 107* 201* 98 194*       Signed:  Florencia Reasons MD, PhD  [facility] 07/28/2017, 7:30 PM

## 2017-07-28 NOTE — Plan of Care (Signed)
  Problem: Activity: Goal: Risk for activity intolerance will decrease Outcome: Adequate for Discharge   

## 2017-07-29 DIAGNOSIS — M1990 Unspecified osteoarthritis, unspecified site: Secondary | ICD-10-CM | POA: Diagnosis not present

## 2017-07-29 DIAGNOSIS — D649 Anemia, unspecified: Secondary | ICD-10-CM | POA: Diagnosis not present

## 2017-07-29 DIAGNOSIS — R0689 Other abnormalities of breathing: Secondary | ICD-10-CM | POA: Diagnosis not present

## 2017-07-30 ENCOUNTER — Telehealth: Payer: Self-pay | Admitting: *Deleted

## 2017-07-30 ENCOUNTER — Telehealth: Payer: Self-pay

## 2017-07-30 NOTE — Telephone Encounter (Signed)
[facility] for additional services

## 2017-07-30 NOTE — Telephone Encounter (Signed)
Judeen Hammans with [facility] notified and agreed.

## 2017-07-30 NOTE — Telephone Encounter (Signed)
Transition Care Management Follow-Up Telephone Call   Date discharged and where: [facility] on 07/28/2017  How have you been since you were released from the hospital? Doing okay, still having some weakness. She has been taking her insulin since being home  Any patient concerns? None  Items Reviewed:   Meds: Y  Allergies: Y  Dietary Changes Reviewed: Y  Functional Questionnaire:  Independent-I Dependent-D  ADLs:   Dressing- I w/ assist    Eating- I   Maintaining continence- Some incontinence with urine   Transferring- Uses wheelchair and walker   Transportation- D   Meal Prep- D   Managing Meds- D  Confirmed importance and Date/Time of follow-up visits scheduled: [name], NP 07/26/2017 @ 1pm   Confirmed with patient if condition worsens to call PCP or go to the Emergency Dept. Patient was given office number and encouraged to call back with questions or concerns: Yes

## 2017-07-30 NOTE — Telephone Encounter (Signed)
Judeen Hammans with [facility] called and stated that patient was discharged from the hospital with orders for PT, RN and [place]. Nurse stated that they will resume the PT but wanted to make sure of the RN and Home Health Aid due to copay with every visit. Please Advise.

## 2017-07-31 DIAGNOSIS — M353 Polymyalgia rheumatica: Secondary | ICD-10-CM | POA: Diagnosis not present

## 2017-07-31 DIAGNOSIS — C50919 Malignant neoplasm of unspecified site of unspecified female breast: Secondary | ICD-10-CM | POA: Diagnosis not present

## 2017-07-31 DIAGNOSIS — I2510 Atherosclerotic heart disease of native coronary artery without angina pectoris: Secondary | ICD-10-CM | POA: Diagnosis not present

## 2017-07-31 DIAGNOSIS — I130 Hypertensive heart and chronic kidney disease with heart failure and stage 1 through stage 4 chronic kidney disease, or unspecified chronic kidney disease: Secondary | ICD-10-CM | POA: Diagnosis not present

## 2017-07-31 DIAGNOSIS — F0390 Unspecified dementia without behavioral disturbance: Secondary | ICD-10-CM | POA: Diagnosis not present

## 2017-07-31 DIAGNOSIS — I252 Old myocardial infarction: Secondary | ICD-10-CM | POA: Diagnosis not present

## 2017-07-31 DIAGNOSIS — M1991 Primary osteoarthritis, unspecified site: Secondary | ICD-10-CM | POA: Diagnosis not present

## 2017-07-31 DIAGNOSIS — J449 Chronic obstructive pulmonary disease, unspecified: Secondary | ICD-10-CM | POA: Diagnosis not present

## 2017-07-31 DIAGNOSIS — M109 Gout, unspecified: Secondary | ICD-10-CM | POA: Diagnosis not present

## 2017-07-31 DIAGNOSIS — I5032 Chronic diastolic (congestive) heart failure: Secondary | ICD-10-CM | POA: Diagnosis not present

## 2017-07-31 DIAGNOSIS — E1122 Type 2 diabetes mellitus with diabetic chronic kidney disease: Secondary | ICD-10-CM | POA: Diagnosis not present

## 2017-07-31 DIAGNOSIS — F329 Major depressive disorder, single episode, unspecified: Secondary | ICD-10-CM | POA: Diagnosis not present

## 2017-07-31 DIAGNOSIS — N183 Chronic kidney disease, stage 3 (moderate): Secondary | ICD-10-CM | POA: Diagnosis not present

## 2017-08-01 ENCOUNTER — Other Ambulatory Visit: Payer: Self-pay

## 2017-08-01 NOTE — Patient Outreach (Signed)
[facility]) Care Management  08/01/2017  [name] [birth year] [id number]  TELEPHONE SCREENING Referral date: 08/01/17 Referral source: EMMI general discharge Referral reason: EMMI general discharge red alert  Insurance: Health team advantage  Telephone call to patient regarding EMMI general discharge red alert. HIPAA verified with patient.  Patient and her daughter [name] on the phone line together. RNCM explained EMMI general discharge to patient and daughter. Patient reports she was in the hospital due to her blood sugar dropping to low and passing out. Patient states she is unsure why her blood sugar dropped the way that it did. Patient states she is on insulin.  Reports today's blood sugar is 77. Patient reports she still is not feeling the best.  Patient reports she did not have much of an appetite prior to going in the hospital. Patient states the day she was admitted she took her insulin but had not eaten anything.  RNCM offered [place] coach services for ongoing diabetes education and management. Patient verbally agreed.  Patient states she has had an increase in swelling in her feet over the past month. States she had this swelling while she was in the hospital as well.  Patient reports she has a follow up appointment with her primary provider on tomorrow.  Patient states she will talk further with her doctor about the increase swelling.  RNCM advised patient to notify MD of any changes in condition prior to scheduled appointment. RNCM provided contact name and number: [phone] or main office number [phone] and 24 hour nurse advise line [phone].  RNCM verified patient aware of 911 services for urgent/ emergent needs.  PLAN: RNCM will refer patient to health coach.   Quinn Plowman RN,BSN,CCM [facility] Telephonic  [phone]

## 2017-08-02 ENCOUNTER — Ambulatory Visit (INDEPENDENT_AMBULATORY_CARE_PROVIDER_SITE_OTHER): Payer: PPO | Admitting: Nurse Practitioner

## 2017-08-02 ENCOUNTER — Encounter: Payer: Self-pay | Admitting: *Deleted

## 2017-08-02 ENCOUNTER — Encounter: Payer: Self-pay | Admitting: Nurse Practitioner

## 2017-08-02 VITALS — BP 132/74 | HR 65 | Temp 98.7°F | Ht 67.0 in | Wt 350.0 lb

## 2017-08-02 DIAGNOSIS — G629 Polyneuropathy, unspecified: Secondary | ICD-10-CM | POA: Diagnosis not present

## 2017-08-02 DIAGNOSIS — Z794 Long term (current) use of insulin: Secondary | ICD-10-CM

## 2017-08-02 DIAGNOSIS — E875 Hyperkalemia: Secondary | ICD-10-CM

## 2017-08-02 DIAGNOSIS — N183 Chronic kidney disease, stage 3 unspecified: Secondary | ICD-10-CM

## 2017-08-02 DIAGNOSIS — D509 Iron deficiency anemia, unspecified: Secondary | ICD-10-CM

## 2017-08-02 DIAGNOSIS — I10 Essential (primary) hypertension: Secondary | ICD-10-CM | POA: Diagnosis not present

## 2017-08-02 DIAGNOSIS — E1165 Type 2 diabetes mellitus with hyperglycemia: Secondary | ICD-10-CM | POA: Diagnosis not present

## 2017-08-02 DIAGNOSIS — IMO0002 Reserved for concepts with insufficient information to code with codable children: Secondary | ICD-10-CM

## 2017-08-02 DIAGNOSIS — E1121 Type 2 diabetes mellitus with diabetic nephropathy: Secondary | ICD-10-CM | POA: Diagnosis not present

## 2017-08-02 MED ORDER — MECLIZINE HCL 25 MG PO TABS: 25.0000 mg | ORAL_TABLET | Freq: Three times a day (TID) | ORAL | 0 refills | Status: DC | PRN

## 2017-08-02 MED ORDER — HYDROXYZINE HCL 10 MG PO TABS: 10.0000 mg | ORAL_TABLET | Freq: Three times a day (TID) | ORAL | 1 refills | Status: DC | PRN

## 2017-08-02 NOTE — Patient Instructions (Signed)
To keep up the good work with limiting junk food 3 nutritious meals a day- good protein intake with meals  To keep follow up appt

## 2017-08-02 NOTE — Progress Notes (Signed)
Careteam: Patient Care Team: Gildardo Cranker, DO as PCP - General (Internal Medicine) [name], [name], MD (Inactive) as Consulting Physician (Cardiology) [name], MD as Consulting Physician (Ophthalmology) [name], MD as Consulting Physician (General Surgery) [name], MD as Referring Physician (Obstetrics and Gynecology) Dannielle Karvonen, RN as [place] Management Tarpley, Illene Regulus, RN as [place] Management  Advanced Directive information    Allergies  Allergen Reactions  . Sulfonamide Derivatives Swelling    Mouth swelling  . Tramadol Nausea And Vomiting  . Aricept [Donepezil Hcl]     GI upset/loose stools    Chief Complaint  Patient presents with  . Transitions Of Care    Pt was recently discharged from [facility] 07/25/17 to 07/28/17 due to hyperkalemia.      HPI: Patient is a 83 y.o. female seen in the office today for hospital follow up. Pt with pmh of DM, dementia, HTN, CKD, anemia, morbid obesity, FTT.  Pt has been having poor oral intake and went to hospital with hypoglycemia, noted to have hyperkalemia as well. This was treated- cozaar also stopped and potassium stable prior to discharge.  Pt has been eating less and less and previously at home on 70 units of toujeo BID, she was maintained in the hospital 20 units with SSI. She was reduced to 35 units BID for discharge with adjusted mealtime insulin. Blood sugars at home have been better she reports  Home readings ranging from 62-280.  States her breakfast is around lunch time. States she goes to bed late- eats a late dinner. Eating 3 meals a day- did not feel symptomatic with the fasting 62 blood sugar. No hypoglycemic episodes. Reports she is attempting to eat better- eats a lot of "junk" chips, crackers,etc.   Ongoing numbness and tingling in hands and feet.  Review of Systems:  Review of Systems  Constitutional: Negative for chills, fever and malaise/fatigue.   Respiratory: Negative for hemoptysis and shortness of breath.   Cardiovascular: Negative for chest pain.  Gastrointestinal: Negative for blood in stool, constipation and diarrhea.  Genitourinary: Negative for hematuria.  Neurological: Positive for tingling and sensory change.  Endo/Heme/Allergies: Bruises/bleeds easily.  Psychiatric/Behavioral: Positive for memory loss.    Past Medical History:  Diagnosis Date  . Allergy    takes Mucinex daily as needed  . Anemia, unspecified   . Anxiety    takes Clonazepam daily as needed  . Arthritis   . CHF (congestive heart failure) (HCC)    takes Furosemide daily  . Coronary artery disease    a. s/p IMI 2004 tx with BMS to RCA;  b. s/p Promus DES to RCA 2/12 (cath: LM 20-30%, pLAD 20-30%, mLAD 50%, RI 30%, pRCA 60%, mRCA 90% - tx with PCI);  c. myoview 1/07: EF 49%, inf scar, no isch  . Coronary atherosclerosis of native coronary artery   . Depression    takes Cymbalta daily  . Diabetes mellitus    insulin daily  . Dysuria   . GERD (gastroesophageal reflux disease)    takes Protonix daily  . Gout, unspecified    takes Colchicine daily  . Headache    occasionally  . History of blood clots about 39yrs ago   in legs-takes Coumadin   . Hyperlipidemia    takes Pravastatin daily  . Hypertension    takes Imdur and Metoprolol daily  . Insomnia   . Joint pain   . Joint swelling   . Myocardial  infarction ([place]) 2004  . Numbness   . Obstructive sleep apnea   . Osteoarthritis   . Osteoarthritis   . Osteoarthrosis, unspecified whether generalized or localized, lower leg   . Pain, chronic   . Polymyalgia rheumatica ([place])   . Pulmonary emboli ([place]) 9/13   felt to need lifelong anticoagulation  . Shortness of breath dyspnea    with exertion and has Albuterol inhaler prn  . Type II or unspecified type diabetes mellitus without mention of complication, uncontrolled   . Urinary incontinence    takes Linzess daily  . Vertigo    hx of;was  taking Meclizine if needed   Past Surgical History:  Procedure Laterality Date  . ABDOMINAL HYSTERECTOMY     partial  . APPENDECTOMY    . blood clots/legs and lungs  2013  . BREAST BIOPSY Left 07/22/2014  . BREAST BIOPSY Left 02/10/2013  . BREAST LUMPECTOMY Left 11/05/2014  . BREAST LUMPECTOMY WITH RADIOACTIVE SEED LOCALIZATION Left 11/05/2014   Procedure: LEFT BREAST LUMPECTOMY WITH RADIOACTIVE SEED LOCALIZATION;  Surgeon: [name], MD;  [facility];  Service: General;  Laterality: Left;  . CARDIAC CATHETERIZATION    . COLONOSCOPY    . CORONARY ANGIOPLASTY  2  . ESOPHAGOGASTRODUODENOSCOPY (EGD) WITH PROPOFOL N/A 11/07/2016   Procedure: ESOPHAGOGASTRODUODENOSCOPY (EGD) WITH PROPOFOL;  Surgeon: Gatha Mayer, MD;  Location: [facility];  Service: Endoscopy;  Laterality: N/A;  . EXCISION OF SKIN TAG Right 11/05/2014   Procedure: EXCISION OF RIGHT EYELID SKIN TAG;  Surgeon: [name], MD;  Location: [place];  Service: General;  Laterality: Right;  . EYE SURGERY Bilateral    cataract   . GASTRIC BYPASS  1977    reversed in 1979, [place] N/A 06/29/2014   Procedure: LEFT HEART CATHETERIZATION WITH CORONARY ANGIOGRAM;  Surgeon: Troy Sine, MD;  Location: [facility] CATH LAB;  Service: Cardiovascular;  Laterality: N/A;  . MI with stent placement  2004   Social History:   reports that she has never smoked. She has never used smokeless tobacco. She reports that she does not drink alcohol or use drugs.  Family History  Problem Relation Age of Onset  . Breast cancer Mother 18  . Heart disease Mother   . Throat cancer Father   . Hypertension Father   . Arthritis Father   . Diabetes Father   . Arthritis Sister   . Obesity Sister   . Diabetes Sister   . Heart disease Cousin   . Colon cancer Neg Hx   . Stomach cancer Neg Hx   . Esophageal cancer Neg Hx     Medications: Patient's Medications  New Prescriptions    No medications on file  Previous Medications   ALBUTEROL (PROVENTIL HFA;VENTOLIN HFA) 108 (90 BASE) MCG/ACT INHALER    Inhale 1-2 puffs into the lungs every 6 (six) hours as needed for wheezing or shortness of breath.   ANASTROZOLE (ARIMIDEX) 1 MG TABLET    Take 1 tablet (1 mg total) by mouth daily.   BUDESONIDE-FORMOTEROL (SYMBICORT) 160-4.5 MCG/ACT INHALER    Inhale 2 puffs into the lungs 2 (two) times daily. For bronchitis   CETIRIZINE (ZYRTEC) 10 MG TABLET    Take 10 mg by mouth at bedtime as needed for allergies.    CLONAZEPAM (KLONOPIN) 1 MG TABLET    Take 2 tablets (2 mg total) by mouth at bedtime. For anxiety   DOCUSATE SODIUM (COLACE) 100 MG CAPSULE  Take 100 mg by mouth daily as needed for mild constipation. TAKE PER BOTTLE AS NEEDED FOR CONSTIPATION    DULOXETINE (CYMBALTA) 60 MG CAPSULE    Take one capsule by mouth once daily for anxiety   ESTRADIOL (ESTRACE) 0.1 MG/GM VAGINAL CREAM    Use every other evening use in vaginal area   FEBUXOSTAT (ULORIC) 40 MG TABLET    Take 1 tablet (40 mg total) by mouth daily.   GABAPENTIN (NEURONTIN) 100 MG CAPSULE    Take 1 capsule (100 mg total) by mouth at bedtime.   HYDRALAZINE (APRESOLINE) 10 MG TABLET    Take 1 tablet (10 mg total) by mouth every 8 (eight) hours.   HYDROXYZINE (ATARAX/VISTARIL) 10 MG TABLET    Take 1 tablet (10 mg total) by mouth 3 (three) times daily as needed for itching.   INSULIN ASPART (NOVOLOG FLEXPEN) 100 UNIT/ML FLEXPEN    Before each meal 3 times a day, 140-199 - 2 units, 200-250 - 4 units, 251-299 - 6 units,  300-349 - 8 units,  350 or above 10 units. Insulin PEN if approved, provide syringes and needles if needed.   INSULIN GLARGINE (TOUJEO SOLOSTAR) 300 UNIT/ML SOPN    Inject 35 Units into the skin 2 (two) times daily. (before breakfast and before supper)   ISOSORBIDE MONONITRATE (IMDUR) 60 MG 24 HR TABLET    Take one tablet by mouth once daily   LANCETS (ONETOUCH ULTRASOFT) LANCETS    Check blood sugar 2-3 times  daily as directed   MECLIZINE (ANTIVERT) 25 MG TABLET    Take 1 tablet (25 mg total) by mouth 3 (three) times daily as needed for dizziness.   MEMANTINE (NAMENDA) 5 MG TABLET    Take 2 tabs po every AM and 1 tab po every PM for memory   METOPROLOL SUCCINATE (TOPROL-XL) 25 MG 24 HR TABLET    Take one tablet by mouth once daily for blood pressure   NITROGLYCERIN (NITROSTAT) 0.4 MG SL TABLET    Take one tablet under the tongue every 5 minutes as needed for chest pain   ONETOUCH VERIO TEST STRIP    Use to test blood sugar three times daily. Dx: E11.00   OXYCODONE-ACETAMINOPHEN (PERCOCET) 10-325 MG TABLET    Take 1 tablet by mouth every 6 (six) hours.   PANTOPRAZOLE (PROTONIX) 40 MG TABLET    Take 1 tablet (40 mg total) by mouth daily.   PRAVASTATIN (PRAVACHOL) 20 MG TABLET    Take one tablet by mouth once daily for cholesterol   RIVAROXABAN (XARELTO) 10 MG TABS TABLET    Take 1 tablet (10 mg total) by mouth daily.   SODIUM CHLORIDE (OCEAN) 0.65 % SOLN NASAL SPRAY    Place 1 spray into both nostrils 3 (three) times daily as needed (for sinus congestion.). RINSE AS NEEDED FOR SINUS CONGESTION    SPACER/AERO-HOLDING CHAMBERS (AEROCHAMBER PLUS FLO-VU LARGE) MISC    1 each by Other route once.  Modified Medications   No medications on file  Discontinued Medications   No medications on file     Physical Exam:  Vitals:   08/02/17 1321  BP: 132/74  Pulse: 65  Temp: 98.7 F (37.1 C)  TempSrc: Oral  SpO2: 99%  Weight: (!) 350 lb (158.8 kg)  Height: 5\' 7"  (1.702 m)   Body mass index is 54.82 kg/m.  Physical Exam  Constitutional: She is oriented to person, place, and time. She appears well-developed and well-nourished.  HENT:  Head: Normocephalic  and atraumatic.  Mouth/Throat: Oropharynx is clear and moist. No oropharyngeal exudate.  Eyes: Pupils are equal, round, and reactive to light. No scleral icterus.  Neck: Normal range of motion. Neck supple. Carotid bruit is not present.    Cardiovascular: Normal rate and regular rhythm. Exam reveals no gallop and no friction rub.  Murmur (1/6 SEM) heard. Pulmonary/Chest: Effort normal and breath sounds normal. No stridor. No respiratory distress. She has no wheezes. She has no rales.  Abdominal: Soft. Normal appearance and bowel sounds are normal. There is no hepatomegaly. There is no rigidity.  obese  Musculoskeletal: She exhibits edema (trace bilaterally).  Neurological: She is alert and oriented to person, place, and time. She has normal reflexes.  Skin: Skin is warm and dry. No rash noted.  Psychiatric: Her behavior is normal. Thought content normal. Cognition and memory are impaired. She exhibits a depressed mood.    Labs reviewed: Basic Metabolic Panel: Recent Labs    08/02/16 1551  07/26/17 0923 07/27/17 0516 07/28/17 0507  NA  --    < > 140 137 141  K  --    < > 5.5* 5.8* 4.8  CL  --    < > 106 105 104  CO2  --    < > 25 24 28   GLUCOSE  --    < > 226* 249* 113*  BUN  --    < > 31* 37* 32*  CREATININE  --    < > 1.58* 1.83* 1.57*  CALCIUM  --    < > 8.8* 8.6* 8.6*  TSH 1.52  --   --   --   --    < > = values in this interval not displayed.   Liver Function Tests: Recent Labs    09/20/16 0923 01/16/17 1516 03/21/17 1351 06/08/17 1015 07/25/17 1815  AST 11 15 12   --  23  ALT 12 9 10 9 14   ALKPHOS 92  --  77  --  83  BILITOT 0.44 0.3 0.30  --  0.4  PROT 7.5 7.4 7.9  --  8.8*  ALBUMIN 2.8*  --  2.8*  --  3.1*   No results for input(s): LIPASE, AMYLASE in the last 8760 hours. No results for input(s): AMMONIA in the last 8760 hours. CBC: Recent Labs    07/25/17 1815 07/27/17 0516 07/28/17 0507  WBC 12.8* 12.4* 11.1*  NEUTROABS 8.9* 7.8* 6.5  HGB 8.8* 7.7* 7.9*  HCT 30.0* 26.1* 27.0*  MCV 85.2 84.7 86.0  PLT 380 323 317   Lipid Panel: Recent Labs    10/13/16 1230 01/16/17 1516 06/08/17 1015  CHOL 99 114 92  HDL 34* 38* 38*  LDLCALC 41 53 37  TRIG 118 149 89  CHOLHDL 2.9 3.0 2.4    TSH: Recent Labs    08/02/16 1551  TSH 1.52   A1C: Lab Results  Component Value Date   HGBA1C 6.7 (H) 07/27/2017     Assessment/Plan 1. Uncontrolled type 2 diabetes mellitus with diabetic nephropathy, with long-term current use of insulin (HCC) Blood sugars reviewed, 1 low blood sugar however otherwise stable, to continue current regimen with SSI and to continue working on diet.  - BASIC METABOLIC PANEL WITH GFR  2. CKD (chronic kidney disease) stage 3, GFR 30-59 ml/min (HCC) -will follow up lab, continue proper hydration and avoid NSAID - BASIC METABOLIC PANEL WITH GFR  3. Hyperkalemia -losartan stopped during recent hospitalization - BASIC METABOLIC PANEL WITH GFR  4.  Microcytic anemia -will follow up lab - CBC with Differential/Platelets - Iron, TIBC and Ferritin Panel  5. Morbid (severe) obesity due to excess calories ([place]) -limiting junk food, weight unchanged but improvement in blood sugar. To continue dietary modifications  6. Neuropathy Ongoing neuropathy in hands and feet- discussed increasing gabapentin but reports she does not wish to take medication during the day if it could cause increase fatigue.   7. Essential hypertension -stable off losartan. Will cont current regimen.   Next appt: to keep follow up with Dr Eulas Post as scheduled.  Carlos American. [place], [facility] [phone]

## 2017-08-03 ENCOUNTER — Other Ambulatory Visit: Payer: Self-pay | Admitting: Internal Medicine

## 2017-08-03 LAB — IRON,TIBC AND FERRITIN PANEL
%SAT: 10 % (calc) — ABNORMAL LOW (ref 11–50)
Ferritin: 34 ng/mL (ref 20–288)
Iron: 29 ug/dL — ABNORMAL LOW (ref 45–160)
TIBC: 288 mcg/dL (calc) (ref 250–450)

## 2017-08-03 LAB — BASIC METABOLIC PANEL WITH GFR
BUN/Creatinine Ratio: 17 (calc) (ref 6–22)
BUN: 27 mg/dL — ABNORMAL HIGH (ref 7–25)
CO2: 29 mmol/L (ref 20–32)
Calcium: 8.8 mg/dL (ref 8.6–10.4)
Chloride: 105 mmol/L (ref 98–110)
Creat: 1.59 mg/dL — ABNORMAL HIGH (ref 0.60–0.88)
GFR, Est African American: 35 mL/min/{1.73_m2} — ABNORMAL LOW (ref >=60)
GFR, Est Non African American: 30 mL/min/{1.73_m2} — ABNORMAL LOW (ref >=60)
Glucose, Bld: 137 mg/dL (ref 65–139)
Potassium: 4.8 mmol/L (ref 3.5–5.3)
Sodium: 141 mmol/L (ref 135–146)

## 2017-08-03 LAB — CBC WITH DIFFERENTIAL/PLATELET
Basophils Absolute: 20 cells/uL (ref 0–200)
Basophils Relative: 0.2 %
Eosinophils Absolute: 283 cells/uL (ref 15–500)
Eosinophils Relative: 2.8 %
HCT: 25.5 % — ABNORMAL LOW (ref 35.0–45.0)
Hemoglobin: 8.2 g/dL — ABNORMAL LOW (ref 11.7–15.5)
Lymphs Abs: 2454 cells/uL (ref 850–3900)
MCH: 25.7 pg — ABNORMAL LOW (ref 27.0–33.0)
MCHC: 32.2 g/dL (ref 32.0–36.0)
MCV: 79.9 fL — ABNORMAL LOW (ref 80.0–100.0)
MPV: 10.6 fL (ref 7.5–12.5)
Monocytes Relative: 4.1 %
Neutro Abs: 6929 cells/uL (ref 1500–7800)
Neutrophils Relative %: 68.6 %
Platelets: 304 10*3/uL (ref 140–400)
RBC: 3.19 10*6/uL — ABNORMAL LOW (ref 3.80–5.10)
RDW: 15 % (ref 11.0–15.0)
Total Lymphocyte: 24.3 %
WBC mixed population: 414 cells/uL (ref 200–950)
WBC: 10.1 10*3/uL (ref 3.8–10.8)

## 2017-08-05 DIAGNOSIS — N183 Chronic kidney disease, stage 3 (moderate): Secondary | ICD-10-CM | POA: Diagnosis not present

## 2017-08-05 DIAGNOSIS — M1991 Primary osteoarthritis, unspecified site: Secondary | ICD-10-CM

## 2017-08-05 DIAGNOSIS — E1122 Type 2 diabetes mellitus with diabetic chronic kidney disease: Secondary | ICD-10-CM | POA: Diagnosis not present

## 2017-08-05 DIAGNOSIS — C50919 Malignant neoplasm of unspecified site of unspecified female breast: Secondary | ICD-10-CM

## 2017-08-05 DIAGNOSIS — M353 Polymyalgia rheumatica: Secondary | ICD-10-CM | POA: Diagnosis not present

## 2017-08-05 DIAGNOSIS — I130 Hypertensive heart and chronic kidney disease with heart failure and stage 1 through stage 4 chronic kidney disease, or unspecified chronic kidney disease: Secondary | ICD-10-CM | POA: Diagnosis not present

## 2017-08-05 DIAGNOSIS — I5032 Chronic diastolic (congestive) heart failure: Secondary | ICD-10-CM

## 2017-08-05 DIAGNOSIS — M109 Gout, unspecified: Secondary | ICD-10-CM

## 2017-08-06 ENCOUNTER — Telehealth: Payer: Self-pay

## 2017-08-06 ENCOUNTER — Telehealth: Payer: Self-pay | Admitting: *Deleted

## 2017-08-06 DIAGNOSIS — I252 Old myocardial infarction: Secondary | ICD-10-CM | POA: Diagnosis not present

## 2017-08-06 DIAGNOSIS — M109 Gout, unspecified: Secondary | ICD-10-CM | POA: Diagnosis not present

## 2017-08-06 DIAGNOSIS — R0689 Other abnormalities of breathing: Secondary | ICD-10-CM | POA: Diagnosis not present

## 2017-08-06 DIAGNOSIS — I5040 Unspecified combined systolic (congestive) and diastolic (congestive) heart failure: Secondary | ICD-10-CM | POA: Diagnosis not present

## 2017-08-06 DIAGNOSIS — I130 Hypertensive heart and chronic kidney disease with heart failure and stage 1 through stage 4 chronic kidney disease, or unspecified chronic kidney disease: Secondary | ICD-10-CM | POA: Diagnosis not present

## 2017-08-06 DIAGNOSIS — M1990 Unspecified osteoarthritis, unspecified site: Secondary | ICD-10-CM | POA: Diagnosis not present

## 2017-08-06 DIAGNOSIS — M1991 Primary osteoarthritis, unspecified site: Secondary | ICD-10-CM | POA: Diagnosis not present

## 2017-08-06 DIAGNOSIS — M353 Polymyalgia rheumatica: Secondary | ICD-10-CM | POA: Diagnosis not present

## 2017-08-06 DIAGNOSIS — E669 Obesity, unspecified: Secondary | ICD-10-CM | POA: Diagnosis not present

## 2017-08-06 DIAGNOSIS — F0390 Unspecified dementia without behavioral disturbance: Secondary | ICD-10-CM | POA: Diagnosis not present

## 2017-08-06 DIAGNOSIS — C50919 Malignant neoplasm of unspecified site of unspecified female breast: Secondary | ICD-10-CM | POA: Diagnosis not present

## 2017-08-06 DIAGNOSIS — E875 Hyperkalemia: Secondary | ICD-10-CM | POA: Diagnosis not present

## 2017-08-06 DIAGNOSIS — F329 Major depressive disorder, single episode, unspecified: Secondary | ICD-10-CM | POA: Diagnosis not present

## 2017-08-06 DIAGNOSIS — I2510 Atherosclerotic heart disease of native coronary artery without angina pectoris: Secondary | ICD-10-CM | POA: Diagnosis not present

## 2017-08-06 DIAGNOSIS — G8929 Other chronic pain: Secondary | ICD-10-CM | POA: Diagnosis not present

## 2017-08-06 DIAGNOSIS — D649 Anemia, unspecified: Secondary | ICD-10-CM | POA: Diagnosis not present

## 2017-08-06 DIAGNOSIS — I5032 Chronic diastolic (congestive) heart failure: Secondary | ICD-10-CM | POA: Diagnosis not present

## 2017-08-06 DIAGNOSIS — N183 Chronic kidney disease, stage 3 (moderate): Secondary | ICD-10-CM | POA: Diagnosis not present

## 2017-08-06 DIAGNOSIS — J449 Chronic obstructive pulmonary disease, unspecified: Secondary | ICD-10-CM | POA: Diagnosis not present

## 2017-08-06 DIAGNOSIS — E1122 Type 2 diabetes mellitus with diabetic chronic kidney disease: Secondary | ICD-10-CM | POA: Diagnosis not present

## 2017-08-06 NOTE — Telephone Encounter (Signed)
Sherry with [facility] called to request verbal orders for skilled nursing 1 x weekly x 9 weeks, 2 PRN visits in the event of deterioration, and Home Health Aid to assist with bathing.  Skilled nursing will will do an initial assessment, help with diabetes, check vitals, help monitor blood sugars, and give general education related to diagnosis.  Per [employer] standing order, verbal order given. Message will be sent to patient's provider as a FYI.

## 2017-08-06 NOTE — Telephone Encounter (Signed)
Anda Kraft with [facility] called requesting verbal orders to continue PT 2X4wks. Verbal orders given.

## 2017-08-10 ENCOUNTER — Other Ambulatory Visit: Payer: Self-pay | Admitting: *Deleted

## 2017-08-10 NOTE — Patient Outreach (Signed)
[facility]) Care Management  08/10/2017  [name] [birth year] [id number]   RN Health Coach Initial Assessment  Referral Date:  08/01/2017 Referral Source:  EMMI General Discharge Screening Reason for Referral:  Disease Management Education Insurance:  Health Team Advantage   Outreach Attempt:  Outreach attempt #1 to patient for introductory call. No answer. RN Health Coach left HIPAA compliant voicemail message along with contact information.  Plan:  RN Health Coach will send unsuccessful outreach letter to patient.  RN Health Coach will make another outreach attempt to patient within 3-4 business days if no return call back from patient.   [place] [phone] [name]

## 2017-08-13 ENCOUNTER — Other Ambulatory Visit: Payer: Self-pay | Admitting: *Deleted

## 2017-08-13 NOTE — Patient Outreach (Signed)
[facility]) Care Management  08/13/2017  [name] 31-Jul-1934 [id number]   RN Health Coach Introduction Call  Referral Date:08/01/2017 Referral Source:EMMI General Discharge Screening Reason for Referral:Disease Management Education Insurance:Health Team Advantage   Outreach Attempt:  RN Health Coach outreached to daughter, [name] to offer home health bath aide services through [facility] [place].  HIPAA verified with [name].  Daughter agreeable to bath aide services with [facility] [place].  RN Health Coach spoke with [name] with [facility] to arrange services, [name] every other day for 2 weeks, then per protocol or assessed patient need for the following 2 weeks for total of 30 days.  Spoke with Nichole Castilla RN Assistant Clinical Director with [facility] to verify patient could receive both services from [facility] and [place].  Per RN [name], patient can receive both services at the same time.  RN Health Coach spoke with [name] at [facility] to verify they have orders for [facility].  Per [name] with [facility], orders for bath aide are in place and aide should be visiting patient tomorrow, 08/14/2017.  Patient's daughter updated on both agencies services.  Plan:  RN Health Coach arranged home bath aide with [facility] [place]. RN Health Coach verified and coordinated home health bath aide services with [facility] for patient to start on 08/14/2017. Patient's daughter updated about each service. RN Health Coach to make another telephone outreach to patient for initial telephone assessment within the next 10 days.  [name] [phone] [name]

## 2017-08-13 NOTE — Patient Outreach (Signed)
[facility]) Care Management  08/13/2017  [name] 06/25/1934 [id number]   RN Health Coach Introduction Call  Referral Date:  08/01/2017 Referral Source:  EMMI General Discharge Screening Reason for Referral:  Disease Management Education Insurance:  Health Team Advantage   Outreach Attempt:  Successful telephone outreach to patient for introductory call.  Daughter states patient is asleep.  HIPAA verified with daughter, [name] (Release of Information on file).  RN Health Coach introduced self and role.  Daughter stating patient continues to be weak and incontinent of urine.  Continues to receive Home Health services with [facility].  [facility] Services reviewed with daughter, [name] and discussed possibility of [place] Nurse for safety evaluation and assessment of patient needs.  [name] declines [place] nurse at this time, verbally consents to monthly telephone outreaches at this time.  Daughter concerned patient has not received Home Health aide to assist with bathing from [facility].  Encouraged daughter to contact home health agency to verify scheduled home health assistance.  Plan:  RN Health Coach will make outreach attempt to patient and daughter for initial telephone assessment within the next 10 business days.  [place] [phone] [email or web address]

## 2017-08-14 DIAGNOSIS — F329 Major depressive disorder, single episode, unspecified: Secondary | ICD-10-CM | POA: Diagnosis not present

## 2017-08-14 DIAGNOSIS — I252 Old myocardial infarction: Secondary | ICD-10-CM | POA: Diagnosis not present

## 2017-08-14 DIAGNOSIS — M1991 Primary osteoarthritis, unspecified site: Secondary | ICD-10-CM | POA: Diagnosis not present

## 2017-08-14 DIAGNOSIS — I130 Hypertensive heart and chronic kidney disease with heart failure and stage 1 through stage 4 chronic kidney disease, or unspecified chronic kidney disease: Secondary | ICD-10-CM | POA: Diagnosis not present

## 2017-08-14 DIAGNOSIS — F0390 Unspecified dementia without behavioral disturbance: Secondary | ICD-10-CM | POA: Diagnosis not present

## 2017-08-14 DIAGNOSIS — J449 Chronic obstructive pulmonary disease, unspecified: Secondary | ICD-10-CM | POA: Diagnosis not present

## 2017-08-14 DIAGNOSIS — E1122 Type 2 diabetes mellitus with diabetic chronic kidney disease: Secondary | ICD-10-CM | POA: Diagnosis not present

## 2017-08-14 DIAGNOSIS — I5032 Chronic diastolic (congestive) heart failure: Secondary | ICD-10-CM | POA: Diagnosis not present

## 2017-08-14 DIAGNOSIS — M353 Polymyalgia rheumatica: Secondary | ICD-10-CM | POA: Diagnosis not present

## 2017-08-14 DIAGNOSIS — I2510 Atherosclerotic heart disease of native coronary artery without angina pectoris: Secondary | ICD-10-CM | POA: Diagnosis not present

## 2017-08-14 DIAGNOSIS — M109 Gout, unspecified: Secondary | ICD-10-CM | POA: Diagnosis not present

## 2017-08-14 DIAGNOSIS — C50919 Malignant neoplasm of unspecified site of unspecified female breast: Secondary | ICD-10-CM | POA: Diagnosis not present

## 2017-08-14 DIAGNOSIS — N183 Chronic kidney disease, stage 3 (moderate): Secondary | ICD-10-CM | POA: Diagnosis not present

## 2017-08-15 DIAGNOSIS — E1122 Type 2 diabetes mellitus with diabetic chronic kidney disease: Secondary | ICD-10-CM | POA: Diagnosis not present

## 2017-08-15 DIAGNOSIS — I2510 Atherosclerotic heart disease of native coronary artery without angina pectoris: Secondary | ICD-10-CM | POA: Diagnosis not present

## 2017-08-15 DIAGNOSIS — J449 Chronic obstructive pulmonary disease, unspecified: Secondary | ICD-10-CM | POA: Diagnosis not present

## 2017-08-15 DIAGNOSIS — F0390 Unspecified dementia without behavioral disturbance: Secondary | ICD-10-CM | POA: Diagnosis not present

## 2017-08-15 DIAGNOSIS — F329 Major depressive disorder, single episode, unspecified: Secondary | ICD-10-CM | POA: Diagnosis not present

## 2017-08-15 DIAGNOSIS — N183 Chronic kidney disease, stage 3 (moderate): Secondary | ICD-10-CM | POA: Diagnosis not present

## 2017-08-15 DIAGNOSIS — C50919 Malignant neoplasm of unspecified site of unspecified female breast: Secondary | ICD-10-CM | POA: Diagnosis not present

## 2017-08-15 DIAGNOSIS — I130 Hypertensive heart and chronic kidney disease with heart failure and stage 1 through stage 4 chronic kidney disease, or unspecified chronic kidney disease: Secondary | ICD-10-CM | POA: Diagnosis not present

## 2017-08-15 DIAGNOSIS — M353 Polymyalgia rheumatica: Secondary | ICD-10-CM | POA: Diagnosis not present

## 2017-08-15 DIAGNOSIS — I5032 Chronic diastolic (congestive) heart failure: Secondary | ICD-10-CM | POA: Diagnosis not present

## 2017-08-15 DIAGNOSIS — M109 Gout, unspecified: Secondary | ICD-10-CM | POA: Diagnosis not present

## 2017-08-15 DIAGNOSIS — I252 Old myocardial infarction: Secondary | ICD-10-CM | POA: Diagnosis not present

## 2017-08-15 DIAGNOSIS — M1991 Primary osteoarthritis, unspecified site: Secondary | ICD-10-CM | POA: Diagnosis not present

## 2017-08-16 ENCOUNTER — Other Ambulatory Visit: Payer: Self-pay | Admitting: *Deleted

## 2017-08-16 DIAGNOSIS — G894 Chronic pain syndrome: Secondary | ICD-10-CM

## 2017-08-16 MED ORDER — OXYCODONE-ACETAMINOPHEN 10-325 MG PO TABS
1.0000 | ORAL_TABLET | Freq: Four times a day (QID) | ORAL | 0 refills | Status: DC
Start: 2017-08-16 — End: 2017-09-17

## 2017-08-16 NOTE — Telephone Encounter (Signed)
Patient's daughter, [name] called and requested narcotic refill [place]CCSRS Database Verified LR: 07/19/17 Pharmacy Confirmed Pended Rx and sent to Dr. Eulas Post for approval.

## 2017-08-17 ENCOUNTER — Encounter: Payer: Self-pay | Admitting: *Deleted

## 2017-08-17 ENCOUNTER — Other Ambulatory Visit: Payer: Self-pay | Admitting: *Deleted

## 2017-08-17 NOTE — Patient Outreach (Signed)
[facility]) Care Management  [place]  08/17/2017   AMAIRA SAFLEY 11/05/1934 [id number]   RN Health Coach Initial Assessment  Referral Date:  08/01/2017 Referral Source:  EMMI General Discharge Screening Reason for Referral:  Disease Management Education Insurance:  Health Team Advantage   Outreach Attempt: Successful telephone outreach to patient's daughter for initial telephone assessment.  HIPAA verified with patient's daughter [name] (Release of Information on file).  Daughter completed initial telephone assessment.  Social:  Patient lives at home with daughter, [name].  [name] states she, her sister and other family members care for patient.  Dependent for all ADLs except she feeds herself and dependent on her family for all IADLs.  Currently receives Home Health RN/Aide/Physical Therapy with [facility] (RN 1x week x9 weeks/PT 2x week x4 weeks/Aide 2x week).  Daughter upset earlier this week due to home health aide not coming out since discharge from hospital.  Confirms today, home health aide from [facility] has come to assist patient with bathing and dressing.  RN Health Coach has also arranged home health bath aide assistance with [facility].  Hassell Done RN with HomeCare Provider to do assessment on 08/28/2017.  Patient ambulates short distances with walker in the home and utilizes wheel chair outside of the home.  Reports about 2 falls in the past year without injuries.  Family transports patient to medical appointments.  DME in the home include:  Hospital bed, blood pressure cuff, CBG meter, Rolator walker, upper dentures, lower partials, eyeglasses, wheelchair, bedside commode, built in shower bench, grab bars in shower and at toilet, and scale.  Conditions:   Per chart review and discussion with patient's daughter, PMH include:  Diabetes, hypertension, hyperlipidemia, coronary artery disease, congestive heart failure, insomnia,  COPD, chronic bronchitis, gout, anemia, depression, tension headache, polymyalgia rheumatica, DVT, GERD, pulmonary emboli, osteoarthritis, obstructive sleep apnea, vertigo, chronic kidney disease, anxiety, dysuria, MI, dementia, left breast cancer with lumpectomy, and appendectomy.  Daughter reports patient's last hospitalization was related to hypoglycemia and hyperkalemia.  Hypoglycemia was related to patient not eating trying loss weight.  Daughter reports patient's appetite has increased and denies any difficulties with eating.  Reports monitoring blood sugars twice a day.  CBG this morning was 178 and the daughter reports ranges of 200's.  Patient able to verbalize her current Hgb A1C of 6.7.  Weighs once a week and monitors blood pressures daily per daughter.  Daughter reports patient is incontinent of urine.  Denies any skin breakdown at this time.  Medications:  Daughter reports patient taking about 20 medications on a daily basis.  Received prepackaged pill packs from [place] and daughters dispense medications to patient.  [name] reports patient is able to self administer her insulin most days after she acquires assistance with dialing up the dose in the insulin pen.  Daughter does report difficulties with co pays for patient's insulins (Novolog and Toujeo) and her Uloric medications.  Discussed [facility] referral for medication assistance and daughter verbally agrees.   Encounter Medications:  Outpatient Encounter Medications as of 08/17/2017  Medication Sig Note  . albuterol (PROVENTIL HFA;VENTOLIN HFA) 108 (90 Base) MCG/ACT inhaler Inhale 1-2 puffs into the lungs every 6 (six) hours as needed for wheezing or shortness of breath.   . anastrozole (ARIMIDEX) 1 MG tablet Take 1 tablet (1 mg total) by mouth daily.   . Biotin 10000 MCG TABS Take 1 tablet by mouth daily.   . bisacodyl (DULCOLAX) 5  MG EC tablet Take 5 mg by mouth daily as needed for moderate constipation.   .  budesonide-formoterol (SYMBICORT) 160-4.5 MCG/ACT inhaler Inhale 2 puffs into the lungs 2 (two) times daily. For bronchitis   . cetirizine (ZYRTEC) 10 MG tablet Take 10 mg by mouth at bedtime as needed for allergies.    . Cholecalciferol (VITAMIN D3) 2000 units TABS Take 3 tablets by mouth.   . clonazePAM (KLONOPIN) 1 MG tablet Take 2 tablets (2 mg total) by mouth at bedtime. For anxiety   . DULoxetine (CYMBALTA) 60 MG capsule Take one capsule by mouth once daily for anxiety   . febuxostat (ULORIC) 40 MG tablet Take 1 tablet (40 mg total) by mouth daily.   [name] gabapentin (NEURONTIN) 100 MG capsule Take 1 capsule (100 mg total) by mouth at bedtime.   [name] glucose blood (ONETOUCH VERIO) test strip USE 1 STRIP TO CHECK GLUCOSE THREE TIMES DAILY  E11.21   . hydrALAZINE (APRESOLINE) 10 MG tablet Take 1 tablet (10 mg total) by mouth every 8 (eight) hours.   . hydrOXYzine (ATARAX/VISTARIL) 10 MG tablet Take 1 tablet (10 mg total) by mouth 3 (three) times daily as needed for itching.   . insulin aspart (NOVOLOG FLEXPEN) 100 UNIT/ML FlexPen Before each meal 3 times a day, 140-199 - 2 units, 200-250 - 4 units, 251-299 - 6 units,  300-349 - 8 units,  350 or above 10 units. Insulin PEN if approved, provide syringes and needles if needed.   . Insulin Glargine (TOUJEO SOLOSTAR) 300 UNIT/ML SOPN Inject 35 Units into the skin 2 (two) times daily. (before breakfast and before supper)   . isosorbide mononitrate (IMDUR) 60 MG 24 hr tablet Take one tablet by mouth once daily   . Lancets (ONETOUCH ULTRASOFT) lancets Check blood sugar 2-3 times daily as directed   . meclizine (ANTIVERT) 25 MG tablet Take 1 tablet (25 mg total) by mouth 3 (three) times daily as needed for dizziness.   . memantine (NAMENDA) 5 MG tablet Take 2 tabs po every AM and 1 tab po every PM for memory   . metoprolol succinate (TOPROL-XL) 25 MG 24 hr tablet Take one tablet by mouth once daily for blood pressure   . nitroGLYCERIN (NITROSTAT) 0.4 MG SL  tablet Take one tablet under the tongue every 5 minutes as needed for chest pain   . oxyCODONE-acetaminophen (PERCOCET) 10-325 MG tablet Take 1 tablet by mouth every 6 (six) hours.   . pantoprazole (PROTONIX) 40 MG tablet Take 1 tablet (40 mg total) by mouth daily.   . pravastatin (PRAVACHOL) 20 MG tablet Take one tablet by mouth once daily for cholesterol   . rivaroxaban (XARELTO) 10 MG TABS tablet Take 1 tablet (10 mg total) by mouth daily.   . sodium chloride (OCEAN) 0.65 % SOLN nasal spray Place 1 spray into both nostrils 3 (three) times daily as needed (for sinus congestion.). RINSE AS NEEDED FOR SINUS CONGESTION    . Spacer/Aero-Holding Chambers (AEROCHAMBER PLUS FLO-VU LARGE) MISC 1 each by Other route once.   . docusate sodium (COLACE) 100 MG capsule Take 100 mg by mouth daily as needed for mild constipation. TAKE PER BOTTLE AS NEEDED FOR CONSTIPATION  08/17/2017: Reports not taking, using dulcolax instead  . estradiol (ESTRACE) 0.1 MG/GM vaginal cream Use every other evening use in vaginal area 08/17/2017: Daughter reports not taking   No facility-administered encounter medications on file as of 08/17/2017.     Functional Status:  In your present state  of health, do you have any difficulty performing the following activities: 08/17/2017 07/26/2017  Hearing? Y N  Comment difficulty hearing at times -  Vision? Y N  Comment seeing a diabetic eye specialist -  Difficulty concentrating or making decisions? Y N  Walking or climbing stairs? Y Y  Dressing or bathing? Y Y  Doing errands, shopping? [name]  Preparing Food and eating ? Y -  Using the Toilet? Y -  In the past six months, have you accidently leaked urine? Y -  Do you have problems with loss of bowel control? Y -  Managing your Medications? Y -  Managing your Finances? Y -  Housekeeping or managing your Housekeeping? Y -  Some recent data might be hidden    Fall/Depression Screening: Fall Risk  08/17/2017 08/02/2017 06/29/2017   Falls in the past year? Yes Yes No  Number falls in past yr: 2 or more 2 or more -  Comment - - -  Injury with Fall? - No -  Comment - - -  Risk Factor Category  High Fall Risk - -  Risk for fall due to : History of fall(s);Impaired balance/gait;Impaired mobility - -  Follow up Education provided;Falls prevention discussed - -   PHQ 2/9 Scores 08/17/2017 06/25/2017 06/19/2017 06/08/2017 04/02/2017 03/24/2016 04/21/2015  PHQ - 2 Score - 0 0 0 3 3 6   PHQ- 9 Score - - - - 10 17 19   Exception Documentation Other- indicate reason in comment box - - - - - -  Not completed spoke with daughter, did not speak with patient - - - - - -    THN CM Care Plan Problem One     Most Recent Value  Care Plan Problem One  Knowledge deficiet related to self care management of diabetes  Role Documenting the Problem One  [facility] for Problem One  Active  THN Long Term Goal   Patient and daughters will report no hospitalizations in the next 90 days.  THN Long Term Goal Start Date  08/17/17  Interventions for Problem One Long Term Goal  Current care plan and goals reviewed with daughter, encouraged to keep and attend medical appointments, assistend with calling [facility] home health to schedule bath aid, coordinated [facility] to assist with bath aid, reviewed medications and encouraged medication compliance, encouraged daughter to discuss medications with primary care provider, [facility] Pharmacist referral for medication assistance, encouraged daughter to discuss code status wishes with primary care provider and to request home DNR or Pink Most form from PCP, encouraged daughter to have Living Will notorized and provide a copy to her primary care provider  [facility] CM Short Term Goal #1   Patient and daughter will have Living Will notorized in the next 30 days.  THN CM Short Term Goal #1 Start Date  08/17/17  Interventions for Short Term Goal #1  Discussed advance directives with daughter,  discussed patient's wishes of no resucitation, encouraged daughter to discuss patient's wishes with her primary care provider, encouraged daughter to have Living Will notorized and providing copy to her physician, encouraged daughter to request Yellow DNR or Pink Most Form from primary care provider, educated daughter on where to keep out of facility DNR/pink MOST form if they are provided one  [facility] CM Short Term Goal #2   Patient and daughter will report morning fasting blood sugars below 200s in the next 30 days.  THN CM Short Term Goal #2  Start Date  08/17/17  Interventions for Short Term Goal #2  Reviewed current Hgb A1C with patient and daughter, congratulated patient on knowing her Hgb A1C, congratulated patient on current A1C, encouraged patient and daughter to discuss goal A1C with physician, discussed hypo and hyperglycemia, discussed healthier meal options to help reduce fasting ranges, sending Living Well with Diabetes Booklet     Appointments:  Patient last saw primary care provider, Dr. Eulas Post on 08/02/2017 and has follow up scheduled for Sep 07, 2017.  Advanced Directives:  Patient currently does not have an Advanced Directive in place.  They are in the process of completing a Living Will and need to get it notarized.  Daughter expresses patient does not wish to be resuscitated, encouraged daughter and patient to have resuscitation discussion with primary care provider.   Consent:  [facility] services reviewed and discussed with daughter.  Daughter verbally agrees to monthly telephone outreaches and [facility] Pharmacist referral.  Plan: [facility] [place] will send [facility] pharmacy referral for medication reconciliation and medication assistance. RN Health Coach will send patient Copy. RN Health Coach will send patient 2019 Calendar Booklet. RN Health Coach will send patient Living Well with Diabetes Booklet. RN Health Coach will send primary MD barriers letter. RN Health Coach will route initial  telephone assessment note to primary MD. [place] will make next monthly outreach to patient in the month of May.  [place] [phone] [email or web address]

## 2017-08-20 ENCOUNTER — Other Ambulatory Visit: Payer: Self-pay | Admitting: Pharmacist

## 2017-08-22 ENCOUNTER — Other Ambulatory Visit: Payer: Self-pay | Admitting: Nurse Practitioner

## 2017-08-22 ENCOUNTER — Other Ambulatory Visit: Payer: Self-pay | Admitting: Pharmacist

## 2017-08-22 NOTE — Patient Outreach (Signed)
[facility] [place] [facility]) Care Management  08/22/2017  [name] [birth year] [id number]    83 y.o. year old female referred to [facility] for Medication Management and Medication Assistance (pharmacy intial outreach- polypharmacy medication review, insulin affordability)   Was unable to reach patient's daughter (DPR signed) via telephone today and have left HIPAA compliant voicemail asking patient's daughter to return my call. (unsuccessful outreach #1).  Plan: Will follow-up within 2-4  business days via telephone.    Charlett Lango, PharmD Clinical Pharmacist, [facility] [phone]

## 2017-08-24 ENCOUNTER — Ambulatory Visit: Payer: Self-pay | Admitting: Pharmacist

## 2017-08-26 DIAGNOSIS — R739 Hyperglycemia, unspecified: Secondary | ICD-10-CM | POA: Diagnosis not present

## 2017-08-26 DIAGNOSIS — I5032 Chronic diastolic (congestive) heart failure: Secondary | ICD-10-CM | POA: Diagnosis not present

## 2017-08-26 DIAGNOSIS — Z794 Long term (current) use of insulin: Secondary | ICD-10-CM | POA: Diagnosis not present

## 2017-08-26 DIAGNOSIS — E1122 Type 2 diabetes mellitus with diabetic chronic kidney disease: Secondary | ICD-10-CM | POA: Insufficient documentation

## 2017-08-26 DIAGNOSIS — J449 Chronic obstructive pulmonary disease, unspecified: Secondary | ICD-10-CM | POA: Diagnosis not present

## 2017-08-26 DIAGNOSIS — N183 Chronic kidney disease, stage 3 (moderate): Secondary | ICD-10-CM | POA: Diagnosis not present

## 2017-08-26 DIAGNOSIS — Z7902 Long term (current) use of antithrombotics/antiplatelets: Secondary | ICD-10-CM | POA: Insufficient documentation

## 2017-08-26 DIAGNOSIS — I2510 Atherosclerotic heart disease of native coronary artery without angina pectoris: Secondary | ICD-10-CM | POA: Diagnosis not present

## 2017-08-26 DIAGNOSIS — I130 Hypertensive heart and chronic kidney disease with heart failure and stage 1 through stage 4 chronic kidney disease, or unspecified chronic kidney disease: Secondary | ICD-10-CM | POA: Diagnosis not present

## 2017-08-26 DIAGNOSIS — I252 Old myocardial infarction: Secondary | ICD-10-CM | POA: Insufficient documentation

## 2017-08-26 DIAGNOSIS — Z79899 Other long term (current) drug therapy: Secondary | ICD-10-CM | POA: Diagnosis not present

## 2017-08-26 DIAGNOSIS — E1165 Type 2 diabetes mellitus with hyperglycemia: Secondary | ICD-10-CM | POA: Diagnosis not present

## 2017-08-27 ENCOUNTER — Other Ambulatory Visit: Payer: Self-pay | Admitting: *Deleted

## 2017-08-27 ENCOUNTER — Encounter (HOSPITAL_COMMUNITY): Payer: Self-pay | Admitting: *Deleted

## 2017-08-27 ENCOUNTER — Ambulatory Visit: Payer: Self-pay | Admitting: Pharmacist

## 2017-08-27 ENCOUNTER — Emergency Department (HOSPITAL_COMMUNITY)
Admission: EM | Admit: 2017-08-27 | Discharge: 2017-08-27 | Disposition: A | Discharge status: Home / Self Care | Payer: PPO | Attending: Emergency Medicine | Admitting: Emergency Medicine

## 2017-08-27 DIAGNOSIS — N183 Chronic kidney disease, stage 3 (moderate): Secondary | ICD-10-CM | POA: Diagnosis not present

## 2017-08-27 DIAGNOSIS — E1122 Type 2 diabetes mellitus with diabetic chronic kidney disease: Secondary | ICD-10-CM | POA: Diagnosis not present

## 2017-08-27 DIAGNOSIS — I2510 Atherosclerotic heart disease of native coronary artery without angina pectoris: Secondary | ICD-10-CM | POA: Diagnosis not present

## 2017-08-27 DIAGNOSIS — I130 Hypertensive heart and chronic kidney disease with heart failure and stage 1 through stage 4 chronic kidney disease, or unspecified chronic kidney disease: Secondary | ICD-10-CM | POA: Diagnosis not present

## 2017-08-27 DIAGNOSIS — C50919 Malignant neoplasm of unspecified site of unspecified female breast: Secondary | ICD-10-CM | POA: Diagnosis not present

## 2017-08-27 DIAGNOSIS — J449 Chronic obstructive pulmonary disease, unspecified: Secondary | ICD-10-CM | POA: Diagnosis not present

## 2017-08-27 DIAGNOSIS — F0390 Unspecified dementia without behavioral disturbance: Secondary | ICD-10-CM | POA: Diagnosis not present

## 2017-08-27 DIAGNOSIS — M353 Polymyalgia rheumatica: Secondary | ICD-10-CM | POA: Diagnosis not present

## 2017-08-27 DIAGNOSIS — F329 Major depressive disorder, single episode, unspecified: Secondary | ICD-10-CM | POA: Diagnosis not present

## 2017-08-27 DIAGNOSIS — M109 Gout, unspecified: Secondary | ICD-10-CM | POA: Diagnosis not present

## 2017-08-27 DIAGNOSIS — R739 Hyperglycemia, unspecified: Secondary | ICD-10-CM

## 2017-08-27 DIAGNOSIS — I252 Old myocardial infarction: Secondary | ICD-10-CM | POA: Diagnosis not present

## 2017-08-27 DIAGNOSIS — I5032 Chronic diastolic (congestive) heart failure: Secondary | ICD-10-CM | POA: Diagnosis not present

## 2017-08-27 DIAGNOSIS — M1991 Primary osteoarthritis, unspecified site: Secondary | ICD-10-CM | POA: Diagnosis not present

## 2017-08-27 LAB — BASIC METABOLIC PANEL
Anion gap: 9 (ref 5–15)
BUN: 32 mg/dL — ABNORMAL HIGH (ref 6–20)
CO2: 22 mmol/L (ref 22–32)
Calcium: 9.1 mg/dL (ref 8.9–10.3)
Chloride: 102 mmol/L (ref 101–111)
Creatinine, Ser: 1.69 mg/dL — ABNORMAL HIGH (ref 0.44–1.00)
GFR calc Af Amer: 32 mL/min — ABNORMAL LOW (ref >60)
GFR calc non Af Amer: 27 mL/min — ABNORMAL LOW (ref >60)
Glucose, Bld: 580 mg/dL (ref 65–99)
Potassium: 5.2 mmol/L — ABNORMAL HIGH (ref 3.5–5.1)
Sodium: 133 mmol/L — ABNORMAL LOW (ref 135–145)

## 2017-08-27 LAB — CBC
HCT: 29.8 % — ABNORMAL LOW (ref 36.0–46.0)
Hemoglobin: 9.1 g/dL — ABNORMAL LOW (ref 12.0–15.0)
MCH: 25.5 pg — ABNORMAL LOW (ref 26.0–34.0)
MCHC: 30.5 g/dL (ref 30.0–36.0)
MCV: 83.5 fL (ref 78.0–100.0)
Platelets: 307 10*3/uL (ref 150–400)
RBC: 3.57 MIL/uL — ABNORMAL LOW (ref 3.87–5.11)
RDW: 15.3 % (ref 11.5–15.5)
WBC: 11.2 10*3/uL — ABNORMAL HIGH (ref 4.0–10.5)

## 2017-08-27 LAB — BLOOD GAS, VENOUS
Acid-base deficit: 2.7 mmol/L — ABNORMAL HIGH (ref 0.0–2.0)
Bicarbonate: 22.4 mmol/L (ref 20.0–28.0)
FIO2: 21
O2 Saturation: 93.2 %
Patient temperature: 98.6
pCO2, Ven: 43.3 mmHg — ABNORMAL LOW (ref 44.0–60.0)
pH, Ven: 7.334 (ref 7.250–7.430)
pO2, Ven: 72.7 mmHg — ABNORMAL HIGH (ref 32.0–45.0)

## 2017-08-27 LAB — CBG MONITORING, ED
Glucose-Capillary: 347 mg/dL — ABNORMAL HIGH (ref 65–99)
Glucose-Capillary: 549 mg/dL (ref 65–99)

## 2017-08-27 NOTE — Patient Outreach (Signed)
[facility] [facility]) Care Management  08/27/2017  [name] 12-21-1934 [id number]   [facility]/24 Hour Nurse Line Follow Up  Referral Date:  08/01/2017 Referral Source:  EMMI General Discharge Screening Reason for Referral:  Disease Management Education Insurance:  Health Team Advantage   Outreach Attempt:  Successful telephone outreach to patient's daughter, [name] (ROI on file).  HIPAA verified with daughter, [name].  Daughter states patient's blood sugars were elevated on yesterday due to patient eating an "Ice Cream Blizzard" without eating any other foods.  States blood sugars >500's and she contacted 24 hour Nurse Line for assistance and from there was referred to go to the emergency room.  Daughter reports blood sugars continue to decrease, stating this mornings blood sugar was down to 245.  Denies any missed doses of medications or insulins.  Carbohydrate Modified diet reviewed with daughter and healthier food options.  Encouraged continued monitoring of diet and blood sugars with notifying primary care provider of blood sugars sustaining >250-300's.  Also discussed with daughter medication compliance and drinking water when blood sugars are elevated.  Plan:  Patient and family to monitor blood sugars and to report sustained elevations to primary care provider for further guidance and directions.  RN Health Coach reinforced the availability of the 24 hour Nurse Line.  RN Health Coach will make next monthly telephone outreach in the month of May.  [place] [phone] [name]

## 2017-08-27 NOTE — ED Triage Notes (Signed)
Pt stated "My sugar was 600, now it's 549.  Had a Blizzard, 3 hushpuppies, a small piece of fish  Took Novolog 10 units @ 2200, and 35 units Toujeo also @ 2200.  Went to [employer], that's where I'm from."

## 2017-08-27 NOTE — Discharge Instructions (Addendum)
Your blood sugar is returning to baseline.  Continue take your medications as prescribed.  Follow-up with primary care doctor return to ED with any worsening symptoms.

## 2017-08-27 NOTE — ED Provider Notes (Signed)
[facility] DEPT Provider Note   CSN: [id number] Arrival date & time: 08/26/17  2347     History   Chief Complaint Chief Complaint  Patient presents with  . Hyperglycemia    HPI [name] is a 83 y.o. female.  HPI 83 year old African-American female past medical history significant for diabetes, CHF, anxiety, anemia, depression, GERD, hyperlipidemia that presents to the emergency department today for hyperglycemia.  Patient states that she ate a blister, 3 hush puppies and a small piece of fish this evening.  States that her blood sugar was reading high on her machine.  She was given 10 units of NovoLog at 2200 and 35 units of Toujeo.  Patient only complaint at that time was a headache which has since resolved.  Patient states that her blood sugars are relatively controlled and run between 102 100 throughout the day.  Patient denies any associated chest pain, shortness of breath.  Denies any other associated symptoms at this time.  Pt denies any fever, chill, vision changes, lightheadedness, dizziness, congestion, neck pain, cp, sob, cough, abd pain, n/v/d, urinary symptoms, change in bowel habits, melena, hematochezia, lower extremity paresthesias.   . Past Medical History:  Diagnosis Date  . Allergy    takes Mucinex daily as needed  . Anemia, unspecified   . Anxiety    takes Clonazepam daily as needed  . Arthritis   . CHF (congestive heart failure) (HCC)    takes Furosemide daily  . Coronary artery disease    a. s/p IMI 2004 tx with BMS to RCA;  b. s/p Promus DES to RCA 2/12 (cath: LM 20-30%, pLAD 20-30%, mLAD 50%, RI 30%, pRCA 60%, mRCA 90% - tx with PCI);  c. myoview 1/07: EF 49%, inf scar, no isch  . Coronary atherosclerosis of native coronary artery   . Depression    takes Cymbalta daily  . Diabetes mellitus    insulin daily  . Dysuria   . GERD (gastroesophageal reflux disease)    takes Protonix daily  . Gout, unspecified    takes Colchicine daily  . Headache    occasionally  . History of blood clots about 83yrs ago   in legs-takes Coumadin   . Hyperlipidemia    takes Pravastatin daily  . Hypertension    takes Imdur and Metoprolol daily  . Insomnia   . Joint pain   . Joint swelling   . Myocardial infarction ([place]) 2004  . Numbness   . Obstructive sleep apnea   . Osteoarthritis   . Osteoarthritis   . Osteoarthrosis, unspecified whether generalized or localized, lower leg   . Pain, chronic   . Polymyalgia rheumatica ([place])   . Pulmonary emboli ([place]) 9/13   felt to need lifelong anticoagulation  . Shortness of breath dyspnea    with exertion and has Albuterol inhaler prn  . Type II or unspecified type diabetes mellitus without mention of complication, uncontrolled   . Urinary incontinence    takes Linzess daily  . Vertigo    hx of;was taking Meclizine if needed    Patient Active Problem List   Diagnosis Date Noted  . Hyperkalemia 07/26/2017  . Anxiety associated with depression 06/08/2017  . GERD with esophagitis 11/15/2016  . Memory loss 11/15/2016  . Multiple benign nevi 11/15/2016  . Esophageal dysphagia   . Severe episode of recurrent major depressive disorder, without psychotic features ([place]) 06/23/2016  . Endometrial polyp 06/23/2016  . Vaginitis and vulvovaginitis 06/23/2016  . Cough  variant asthma 11/17/2015  . Vitamin D deficiency 08/02/2015  . Breast cancer ([place]) 04/26/2015  . Dyslipidemia associated with type 2 diabetes mellitus ([place]) 02/06/2015  . Uncontrolled type 2 diabetes mellitus with diabetic nephropathy, with long-term current use of insulin ([place]) 02/06/2015  . Chronic pain syndrome 02/06/2015  . Morbid obesity with BMI of 50.0-59.9, adult ([place]) 12/18/2014  . CKD (chronic kidney disease) stage 3, GFR 30-59 ml/min (HCC) 12/18/2014  . Gout 10/27/2014  . Breast mass 10/27/2014  . Encounter for therapeutic drug monitoring 10/08/2014  . Vertigo 09/24/2014  . Chronic  bronchitis ([place]) 09/23/2014  . Preoperative clearance 08/16/2014  . Abnormal stress test   . DOE (dyspnea on exertion) 06/24/2014  . CN (constipation) 05/10/2014  . History of pulmonary embolus (PE) 04/22/2014  . Sepsis secondary to UTI ([place]) 04/21/2014  . COPD (chronic obstructive pulmonary disease) ([place]) 04/21/2014  . Estrogen deficiency 02/19/2014  . Breast cancer screening 02/19/2014  . Weight gain 02/19/2014  . Pain in the chest 12/31/2013  . Type 2 diabetes mellitus with diabetic foot deformity ([place]) 07/29/2013  . OSA (obstructive sleep apnea) 02/24/2013  . Need for prophylactic vaccination and inoculation against influenza 01/08/2013  . Insomnia 09/17/2012  . Debility 08/01/2012  . Osteoarthritis   . Dysphagia, unspecified(787.20) 07/31/2012  . GERD (gastroesophageal reflux disease) 07/31/2012  . Long term current use of anticoagulant therapy 02/02/2012  . Chronic diastolic congestive heart failure ([place]) 01/29/2012  . History of deep venous thrombosis 01/27/2012  . Abdominal pain 01/26/2012  . CHOLELITHIASIS 06/06/2010  . Situational depression 06/04/2009  . ORTHOPNEA 03/09/2009  . Morbid (severe) obesity due to excess calories ([place]) 02/15/2009  . ANEMIA 02/15/2009  . POLYMYALGIA RHEUMATICA 01/18/2009  . TENSION HEADACHE 01/07/2008  . Headache 01/07/2008  . FUNGAL DERMATITIS 11/13/2007  . CAD (coronary artery disease) 08/29/2007  . URINARY INCONTINENCE 08/29/2007  . Hyperlipidemia 12/03/2006  . Essential hypertension 12/03/2006  . DIVERTICULITIS, HX OF 12/03/2006    Past Surgical History:  Procedure Laterality Date  . ABDOMINAL HYSTERECTOMY     partial  . APPENDECTOMY    . blood clots/legs and lungs  2013  . BREAST BIOPSY Left 07/22/2014  . BREAST BIOPSY Left 02/10/2013  . BREAST LUMPECTOMY Left 11/05/2014  . BREAST LUMPECTOMY WITH RADIOACTIVE SEED LOCALIZATION Left 11/05/2014   Procedure: LEFT BREAST LUMPECTOMY WITH RADIOACTIVE SEED LOCALIZATION;  Surgeon:  [name], MD;  Location: [facility];  Service: General;  Laterality: Left;  . CARDIAC CATHETERIZATION    . COLONOSCOPY    . CORONARY ANGIOPLASTY  2  . ESOPHAGOGASTRODUODENOSCOPY (EGD) WITH PROPOFOL N/A 11/07/2016   Procedure: ESOPHAGOGASTRODUODENOSCOPY (EGD) WITH PROPOFOL;  Surgeon: Gatha Mayer, MD;  Location: [facility];  Service: Endoscopy;  Laterality: N/A;  . EXCISION OF SKIN TAG Right 11/05/2014   Procedure: EXCISION OF RIGHT EYELID SKIN TAG;  Surgeon: [name], MD;  Location: [facility];  Service: General;  Laterality: Right;  . EYE SURGERY Bilateral    cataract   . GASTRIC BYPASS  1977    reversed in 1979, [place] N/A 06/29/2014   Procedure: LEFT HEART CATHETERIZATION WITH CORONARY ANGIOGRAM;  Surgeon: Troy Sine, MD;  Location: [facility] CATH LAB;  Service: Cardiovascular;  Laterality: N/A;  . MI with stent placement  2004     OB History   None      Home Medications    Prior to Admission medications   Medication Sig Start Date End Date Taking? Authorizing  Provider  albuterol (PROVENTIL HFA;VENTOLIN HFA) 108 (90 Base) MCG/ACT inhaler Inhale 1-2 puffs into the lungs every 6 (six) hours as needed for wheezing or shortness of breath. 03/15/16  Yes Robyn Haber, MD  anastrozole (ARIMIDEX) 1 MG tablet Take 1 tablet (1 mg total) by mouth daily. 09/20/16  Yes [name], MD  Biotin 10000 MCG TABS Take 1 tablet by mouth daily.   Yes [provider]  bisacodyl (DULCOLAX) 5 MG EC tablet Take 5 mg by mouth daily as needed for moderate constipation.   Yes [provider]  budesonide-formoterol (SYMBICORT) 160-4.5 MCG/ACT inhaler Inhale 2 puffs into the lungs 2 (two) times daily. For bronchitis 06/18/17  Yes Eulas Post, Monica, DO  cetirizine (ZYRTEC) 10 MG tablet Take 10 mg by mouth at bedtime as needed for allergies.    Yes [provider]  Cholecalciferol (VITAMIN D3) 2000 units TABS Take  3 tablets by mouth.   Yes [provider]  clonazePAM (KLONOPIN) 1 MG tablet Take 2 tablets (2 mg total) by mouth at bedtime. For anxiety 06/08/17  Yes Eulas Post, Monica, DO  docusate sodium (COLACE) 100 MG capsule Take 100 mg by mouth daily as needed for mild constipation. TAKE PER BOTTLE AS NEEDED FOR CONSTIPATION    Yes [provider]  DULoxetine (CYMBALTA) 60 MG capsule Take one capsule by mouth once daily for anxiety 07/25/17  Yes Gildardo Cranker, DO  estradiol (ESTRACE) 0.1 MG/GM vaginal cream Use every other evening use in vaginal area 02/06/17  Yes [provider]  febuxostat (ULORIC) 40 MG tablet Take 1 tablet (40 mg total) by mouth daily. 07/25/17  Yes Eulas Post, Monica, DO  gabapentin (NEURONTIN) 100 MG capsule Take 1 capsule (100 mg total) by mouth at bedtime. 07/28/17  Yes Florencia Reasons, MD  glucose blood [facility] VERIO) test strip USE 1 STRIP TO CHECK GLUCOSE THREE TIMES DAILY  E11.21 08/03/17  Yes Gildardo Cranker, DO  hydrALAZINE (APRESOLINE) 10 MG tablet Take 1 tablet (10 mg total) by mouth every 8 (eight) hours. 07/28/17  Yes Florencia Reasons, MD  hydrOXYzine (ATARAX/VISTARIL) 10 MG tablet Take 1 tablet (10 mg total) by mouth 3 (three) times daily as needed for itching. 08/02/17  Yes [name], NP  insulin aspart (NOVOLOG FLEXPEN) 100 UNIT/ML FlexPen Before each meal 3 times a day, 140-199 - 2 units, 200-250 - 4 units, 251-299 - 6 units,  300-349 - 8 units,  350 or above 10 units. Insulin PEN if approved, provide syringes and needles if needed. 07/28/17  Yes Florencia Reasons, MD  Insulin Glargine (TOUJEO SOLOSTAR) 300 UNIT/ML SOPN Inject 35 Units into the skin 2 (two) times daily. (before breakfast and before supper) 07/28/17  Yes Florencia Reasons, MD  isosorbide mononitrate (IMDUR) 60 MG 24 hr tablet Take one tablet by mouth once daily Patient taking differently: Take 60 mg by mouth daily.  07/25/17  Yes Eulas Post, Brayton [place]man, DO  Lancets [facility] ULTRASOFT) lancets Check blood sugar 2-3 times daily  as directed 11/01/15  Yes [provider]  meclizine (ANTIVERT) 25 MG tablet TAKE 1 TABLET BY MOUTH 3 TIMES DAILY AS NEEDED FOR DIZZINESS 08/22/17  Yes Eulas Post, Monica, DO  memantine (NAMENDA) 5 MG tablet Take 2 tabs po every AM and 1 tab po every PM for memory Patient taking differently: Take 5-10 mg by mouth 2 (two) times daily. Take 2 tabs po every AM and 1 tab po every PM for memory 06/08/17  Yes Eulas Post, Monica, DO  metoprolol succinate (TOPROL-XL) 25 MG  24 hr tablet Take one tablet by mouth once daily for blood pressure 04/23/17  Yes Eulas Post, Brayton Layman, DO  nitroGLYCERIN (NITROSTAT) 0.4 MG SL tablet Take one tablet under the tongue every 5 minutes as needed for chest pain 09/24/13  Yes [name], NP  oxyCODONE-acetaminophen (PERCOCET) 10-325 MG tablet Take 1 tablet by mouth every 6 (six) hours. Patient taking differently: Take 1 tablet by mouth every 6 (six) hours as needed for pain.  08/16/17  Yes Eulas Post, Monica, DO  pantoprazole (PROTONIX) 40 MG tablet Take 1 tablet (40 mg total) by mouth daily. 02/22/17  Yes Eulas Post, Monica, DO  pravastatin (PRAVACHOL) 20 MG tablet Take one tablet by mouth once daily for cholesterol Patient taking differently: Take 20 mg by mouth daily.  07/25/17  Yes Gildardo Cranker, DO  rivaroxaban (XARELTO) 10 MG TABS tablet Take 1 tablet (10 mg total) by mouth daily. 06/12/17  Yes Eulas Post, Monica, DO  sodium chloride (OCEAN) 0.65 % SOLN nasal spray Place 1 spray into both nostrils 3 (three) times daily as needed (for sinus congestion.). RINSE AS NEEDED FOR SINUS CONGESTION    Yes [provider]  Spacer/Aero-Holding Chambers (AEROCHAMBER PLUS FLO-VU LARGE) MISC 1 each by Other route once. 11/04/15  Yes Lysbeth Penner, FNP    Family History Family History  Problem Relation Age of Onset  . Breast cancer Mother 54  . Heart disease Mother   . Throat cancer Father   . Hypertension Father   . Arthritis Father   . Diabetes Father   . Arthritis Sister   .  Obesity Sister   . Diabetes Sister   . Heart disease Cousin   . Colon cancer Neg Hx   . Stomach cancer Neg Hx   . Esophageal cancer Neg Hx     Social History Social History   Tobacco Use  . Smoking status: Never Smoker  . Smokeless tobacco: Never Used  Substance Use Topics  . Alcohol use: No    Alcohol/week: 0.0 oz  . Drug use: No     Allergies   Sulfonamide derivatives; Tramadol; and Aricept [donepezil hcl]   Review of Systems Review of Systems  All other systems reviewed and are negative.    Physical Exam Updated Vital Signs BP (!) 137/52 (BP Location: Right Arm)   Pulse 84   Temp 98.3 F (36.8 C) (Oral)   Resp 18   Ht 5\' 7"  (1.702 m)   Wt (!) 158.8 kg (350 lb)   SpO2 99%   BMI 54.82 kg/m   Physical Exam  Constitutional: She is oriented to person, place, and time. She appears well-developed and well-nourished.  Non-toxic appearance. No distress.  HENT:  Head: Normocephalic and atraumatic.  Nose: Nose normal.  Mouth/Throat: Oropharynx is clear and moist.  Eyes: Pupils are equal, round, and reactive to light. Conjunctivae are normal. Right eye exhibits no discharge. Left eye exhibits no discharge.  Neck: Normal range of motion. Neck supple.  Cardiovascular: Normal rate, regular rhythm, normal heart sounds and intact distal pulses. Exam reveals no gallop and no friction rub.  No murmur heard. Pulmonary/Chest: Effort normal. No respiratory distress.  Abdominal: Soft. Bowel sounds are normal. There is no tenderness. There is no rebound and no guarding.  Obese body habitus  Musculoskeletal: Normal range of motion. She exhibits no tenderness.  Trace pitting edema to the lower extremities.  Lymphadenopathy:    She has no cervical adenopathy.  Neurological: She is alert and oriented to person, place, and time.  Skin: Skin is warm and dry. Capillary refill takes less than 2 seconds.  Psychiatric: Her behavior is normal. Judgment and thought content normal.    Nursing note and vitals reviewed.    ED Treatments / Results  Labs (all labs ordered are listed, but only abnormal results are displayed) Labs Reviewed  BASIC METABOLIC PANEL - Abnormal; Notable for the following components:      Result Value   Sodium 133 (*)    Potassium 5.2 (*)    Glucose, Bld 580 (*)    BUN 32 (*)    Creatinine, Ser 1.69 (*)    GFR calc non Af Amer 27 (*)    GFR calc Af Amer 32 (*)    All other components within normal limits  CBC - Abnormal; Notable for the following components:   WBC 11.2 (*)    RBC 3.57 (*)    Hemoglobin 9.1 (*)    HCT 29.8 (*)    MCH 25.5 (*)    All other components within normal limits  BLOOD GAS, VENOUS - Abnormal; Notable for the following components:   pCO2, Ven 43.3 (*)    pO2, Ven 72.7 (*)    Acid-base deficit 2.7 (*)    All other components within normal limits  CBG MONITORING, ED - Abnormal; Notable for the following components:   Glucose-Capillary 549 (*)    All other components within normal limits  CBG MONITORING, ED - Abnormal; Notable for the following components:   Glucose-Capillary 347 (*)    All other components within normal limits  CBG MONITORING, ED    EKG None  Radiology No results found.  Procedures Procedures (including critical care time)  Medications Ordered in ED Medications - No data to display   Initial Impression / Assessment and Plan / ED Course  I have reviewed the triage vital signs and the nursing notes.  Pertinent labs & imaging results that were available during my care of the patient were reviewed by me and considered in my medical decision making (see chart for details).     Patient presents to the ED for evaluation of hyperglycemia in the setting of eating a blizzard, hush puppies and fish.  Patient's blood sugar was reading 600 at home on her monitor.  She was given NovoLog and Toujeo.  Patient reports a headache at that time however this has since resolved.  She has no  complaints at this time.  Patient overall well-appearing and nontoxic.  Vital signs are reassuring.  Lab work is reassuring.  Patient's blood sugar was 580 which has improved to 347 with fluids and her home insulin.  Patient's hemoglobin is 9.1 which is improved from her baseline and prior reading.  Patient has a mild leukocytosis with history of same.  Potassium was 5.2 with history of same.  Patient not appear symptomatic with this.  Encouraged follow-up for recheck.  Patient's creatinine is at her baseline.  Normal anion gap.  Patient is not acidotic.  No signs of DKA.  Patient appears to be at her baseline and ready for discharge.  Encouraged her to follow-up with her primary care doctor for recheck of her blood work and to continue taking her diabetes medication.  Pt is hemodynamically stable, in NAD, & able to ambulate in the ED. Evaluation does not show pathology that would require ongoing emergent intervention or inpatient treatment. I explained the diagnosis to the patient. Pt has no complaints prior to dc. Pt is comfortable with above plan and  is stable for discharge at this time. All questions were answered prior to disposition. Strict return precautions for f/u to the ED were discussed. Encouraged follow up with PCP.  Patient discussed and was evaluated by my attending who is agreeable with the above plan.   Final Clinical Impressions(s) / ED Diagnoses   Final diagnoses:  Hyperglycemia    ED Discharge Orders    None       Aaron Edelman 08/27/17 0315    Jola Schmidt, MD 08/28/17 [phone]

## 2017-08-28 ENCOUNTER — Other Ambulatory Visit: Payer: Self-pay | Admitting: *Deleted

## 2017-08-28 MED ORDER — GABAPENTIN 100 MG PO CAPS
100.0000 mg | ORAL_CAPSULE | Freq: Every day | ORAL | 0 refills | Status: DC
Start: 2017-08-28 — End: 2017-09-07

## 2017-08-28 MED ORDER — HYDRALAZINE HCL 10 MG PO TABS: 10.0000 mg | ORAL_TABLET | Freq: Three times a day (TID) | ORAL | 0 refills | Status: DC

## 2017-08-28 NOTE — Telephone Encounter (Signed)
[facility] 

## 2017-08-29 ENCOUNTER — Other Ambulatory Visit: Payer: Self-pay | Admitting: *Deleted

## 2017-08-29 MED ORDER — ONETOUCH ULTRASOFT LANCETS MISC: 3 refills | Status: DC

## 2017-08-29 NOTE — Patient Outreach (Signed)
[facility]) Care Management  08/29/2017  [name] 1934-08-04 [id number]   [place] UM Referral Follow Up  Referral Date: 08/01/2017 Referral Source: EMMI General Discharge Screening Reason for Referral: Disease Management Education Insurance: [employer]   Outreach Attempt:  Outreach attempt #1 to patient and daughter for [facility] UM Referral follow up/screening. No answer. RN Health Coach left HIPAA compliant voicemail message along with contact information.  Plan:  RN Health Coach will make another outreach attempt to patient within 3-4 business days if no return call back from patient.  [place] [phone] [email or web address]

## 2017-08-29 NOTE — Telephone Encounter (Signed)
Patient requested refill

## 2017-08-30 ENCOUNTER — Other Ambulatory Visit: Payer: Self-pay | Admitting: *Deleted

## 2017-08-30 NOTE — Patient Outreach (Signed)
[facility]) Care Management  08/30/2017  [name] [birth year] [id number]   [place] UM Referral Follow Up  Referral Date: 08/01/2017 Referral Source: EMMI General Discharge Screening Reason for Referral: Disease Management Education Insurance: [employer]   Outreach Attempt:  Successful telephone outreach to patient for [facility] UM referral follow up stating daughter had concerns concerning patient's home health services.  HIPAA verified with patient.  Patient requesting RN Health Coach speak with daughter [name].  Spoke with daughter, [name] and discussed [facility] Pharmacist has been trying to reach to assist with medication (Uloric) cost.  Informed daughter, [facility] Pharmacist is going to make an outreach attempt tomorrow and Julie's contact number provided to daughter just in case she missed her call.  Encouraged daughter to call [facility] Pharmacist if she does not speak with her tomorrow.  Also, discussed with daughter [place] with [facility] [name].  Daughter states nurse from [facility] came out Tuesday to complete an assessment and they will provide a bath aide for the next 2 months.  Daughter states she is satisfied with this services and denies any further questions or concerns.  Does verbalize she has ordered diabetic lancet needles from their pharmacy and are waiting to receive them.  Plan:  RN Health Coach will make next monthly outreach to patient in the month of May.  [place] [phone] [name]

## 2017-08-31 ENCOUNTER — Encounter: Payer: Self-pay | Admitting: Pharmacist

## 2017-08-31 ENCOUNTER — Telehealth: Payer: Self-pay

## 2017-08-31 ENCOUNTER — Ambulatory Visit: Payer: Self-pay | Admitting: Pharmacist

## 2017-08-31 DIAGNOSIS — K222 Esophageal obstruction: Secondary | ICD-10-CM

## 2017-08-31 NOTE — Telephone Encounter (Signed)
Patient's daughter called to say that patient has been having stomach issues for several months that consist of dark stools and feeling like something is trying to claw it's way out of her stomach. I offered to schedule patient an appointment and she declined. When I explained that I would send Dr. Eulas Post a message with her concerns but she would most likely still need an appointment for evaluation, she still declined appointment.   Please advise with any recommendations

## 2017-09-02 NOTE — Telephone Encounter (Signed)
She really needs to see GI of she is having issues with her stomach. Upper endoscopy in July 2018 showed stenosis and tortuous esophagus. She required dilation at that time. My recommendation is GI referral

## 2017-09-03 ENCOUNTER — Other Ambulatory Visit: Payer: Self-pay | Admitting: Pharmacist

## 2017-09-03 ENCOUNTER — Emergency Department (HOSPITAL_COMMUNITY): Admission: EM | Admit: 2017-09-03 | Discharge: 2017-09-03 | Discharge status: Against Medical Advice | Payer: PPO

## 2017-09-03 DIAGNOSIS — M1991 Primary osteoarthritis, unspecified site: Secondary | ICD-10-CM | POA: Diagnosis not present

## 2017-09-03 DIAGNOSIS — I5032 Chronic diastolic (congestive) heart failure: Secondary | ICD-10-CM | POA: Diagnosis not present

## 2017-09-03 DIAGNOSIS — F329 Major depressive disorder, single episode, unspecified: Secondary | ICD-10-CM | POA: Diagnosis not present

## 2017-09-03 DIAGNOSIS — C50919 Malignant neoplasm of unspecified site of unspecified female breast: Secondary | ICD-10-CM | POA: Diagnosis not present

## 2017-09-03 DIAGNOSIS — I252 Old myocardial infarction: Secondary | ICD-10-CM | POA: Diagnosis not present

## 2017-09-03 DIAGNOSIS — E1122 Type 2 diabetes mellitus with diabetic chronic kidney disease: Secondary | ICD-10-CM | POA: Diagnosis not present

## 2017-09-03 DIAGNOSIS — I130 Hypertensive heart and chronic kidney disease with heart failure and stage 1 through stage 4 chronic kidney disease, or unspecified chronic kidney disease: Secondary | ICD-10-CM | POA: Diagnosis not present

## 2017-09-03 DIAGNOSIS — F0390 Unspecified dementia without behavioral disturbance: Secondary | ICD-10-CM | POA: Diagnosis not present

## 2017-09-03 DIAGNOSIS — J449 Chronic obstructive pulmonary disease, unspecified: Secondary | ICD-10-CM | POA: Diagnosis not present

## 2017-09-03 DIAGNOSIS — M353 Polymyalgia rheumatica: Secondary | ICD-10-CM | POA: Diagnosis not present

## 2017-09-03 DIAGNOSIS — N183 Chronic kidney disease, stage 3 (moderate): Secondary | ICD-10-CM | POA: Diagnosis not present

## 2017-09-03 DIAGNOSIS — M109 Gout, unspecified: Secondary | ICD-10-CM | POA: Diagnosis not present

## 2017-09-03 DIAGNOSIS — I2510 Atherosclerotic heart disease of native coronary artery without angina pectoris: Secondary | ICD-10-CM | POA: Diagnosis not present

## 2017-09-03 NOTE — Telephone Encounter (Signed)
Patient agreed to GI referral. Order was placed.

## 2017-09-03 NOTE — Patient Outreach (Signed)
[facility]) Care Management  [place]   09/03/2017  [name] [birth year] [id number]  83 y.o. year old female referred to [place] for Medication Management (initial pharmacy medication review) and Medication Assistance (Uloric) Referred by Hubert Azure, [facility] RN Health Coach  PMH s/f: hypertension, CHF, coronary artery disease, COPD, OSA, GERD, type 2 diabetes mellitus with neuropathy, dyslipidemia, gout, morbid obesity, breast cancer, depression  Patient with [employer] Medicare Advantage plan.   Spoke with patient's daughter, [name] (DPR on file). [name] confirms identity with HIPAA-identifiers x2 and gives verbal consent to speak over the phone about medications.   Subjective: Medication Assistance:  Uloric is currently unaffordable [name] cannot remember the copay at this time). Insulins and inhalers are affordable for now. Over income for Extra Help through Medicare.   Medication Management/Adherence  Patient's daughter's, [name] and [name], manage her medications. Patient does not currently use a pill box. Conversation regarding adherence and management was difficult as [name] was often distracted throughout our conversation, kept leaving the room and speaking to other people.   Current Medications: Current Outpatient Medications  Medication Sig Dispense Refill  . albuterol (PROVENTIL HFA;VENTOLIN HFA) 108 (90 Base) MCG/ACT inhaler Inhale 1-2 puffs into the lungs every 6 (six) hours as needed for wheezing or shortness of breath. 1 Inhaler 6  . anastrozole (ARIMIDEX) 1 MG tablet Take 1 tablet (1 mg total) by mouth daily. 90 tablet 4  . Biotin 10000 MCG TABS Take 1 tablet by mouth daily.    . bisacodyl (DULCOLAX) 5 MG EC tablet Take 5 mg by mouth daily as needed for moderate constipation.    . budesonide-formoterol (SYMBICORT) 160-4.5 MCG/ACT inhaler Inhale 2 puffs into the lungs 2 (two) times daily. For bronchitis 1 Inhaler 6  .  cetirizine (ZYRTEC) 10 MG tablet Take 10 mg by mouth at bedtime as needed for allergies.     . Cholecalciferol (VITAMIN D3) 2000 units TABS Take 2 tablets by mouth.     . clonazePAM (KLONOPIN) 1 MG tablet Take 2 tablets (2 mg total) by mouth at bedtime. For anxiety 60 tablet 3  . DULoxetine (CYMBALTA) 60 MG capsule Take one capsule by mouth once daily for anxiety 90 capsule 0  . gabapentin (NEURONTIN) 100 MG capsule Take 1 capsule (100 mg total) by mouth at bedtime. 30 capsule 0  . glucose blood (ONETOUCH VERIO) test strip USE 1 STRIP TO CHECK GLUCOSE THREE TIMES DAILY  E11.21 300 each 12  . hydrALAZINE (APRESOLINE) 10 MG tablet Take 1 tablet (10 mg total) by mouth every 8 (eight) hours. 90 tablet 0  . hydrOXYzine (ATARAX/VISTARIL) 10 MG tablet Take 1 tablet (10 mg total) by mouth 3 (three) times daily as needed for itching. 60 tablet 1  . insulin aspart (NOVOLOG FLEXPEN) 100 UNIT/ML FlexPen Before each meal 3 times a day, 140-199 - 2 units, 200-250 - 4 units, 251-299 - 6 units,  300-349 - 8 units,  350 or above 10 units. Insulin PEN if approved, provide syringes and needles if needed. 15 mL 0  . Insulin Glargine (TOUJEO SOLOSTAR) 300 UNIT/ML SOPN Inject 35 Units into the skin 2 (two) times daily. (before breakfast and before supper) 12 pen 0  . isosorbide mononitrate (IMDUR) 60 MG 24 hr tablet Take one tablet by mouth once daily (Patient taking differently: Take 60 mg by mouth daily. Take one tablet by mouth once daily) 90 tablet 0  . Lancets (ONETOUCH ULTRASOFT) lancets Check blood sugar up to three  time daily. Dx:E11.9 300 each 3  . meclizine (ANTIVERT) 25 MG tablet TAKE 1 TABLET BY MOUTH 3 TIMES DAILY AS NEEDED FOR DIZZINESS 60 tablet 0  . memantine (NAMENDA) 5 MG tablet Take 2 tabs po every AM and 1 tab po every PM for memory (Patient taking differently: Take 5-10 mg by mouth 2 (two) times daily. Take 2 tabs po every AM and 1 tab po every PM for memory) 90 tablet 6  . metoprolol succinate  (TOPROL-XL) 25 MG 24 hr tablet Take one tablet by mouth once daily for blood pressure 90 tablet 1  . nitroGLYCERIN (NITROSTAT) 0.4 MG SL tablet Take one tablet under the tongue every 5 minutes as needed for chest pain 50 tablet 0  . oxyCODONE-acetaminophen (PERCOCET) 10-325 MG tablet Take 1 tablet by mouth every 6 (six) hours. (Patient taking differently: Take 1 tablet by mouth every 6 (six) hours as needed for pain. ) 120 tablet 0  . pantoprazole (PROTONIX) 40 MG tablet Take 1 tablet (40 mg total) by mouth daily. 90 tablet 3  . pravastatin (PRAVACHOL) 20 MG tablet Take one tablet by mouth once daily for cholesterol (Patient taking differently: Take 20 mg by mouth daily. ) 90 tablet 0  . rivaroxaban (XARELTO) 10 MG TABS tablet Take 1 tablet (10 mg total) by mouth daily. 30 tablet 11  . sodium chloride (OCEAN) 0.65 % SOLN nasal spray Place 1 spray into both nostrils 3 (three) times daily as needed (for sinus congestion.). RINSE AS NEEDED FOR SINUS CONGESTION     . Spacer/Aero-Holding Chambers (AEROCHAMBER PLUS FLO-VU LARGE) MISC 1 each by Other route once. 1 each 0  . docusate sodium (COLACE) 100 MG capsule Take 100 mg by mouth daily as needed for mild constipation. TAKE PER BOTTLE AS NEEDED FOR CONSTIPATION     . estradiol (ESTRACE) 0.1 MG/GM vaginal cream Use every other evening use in vaginal area    . febuxostat (ULORIC) 40 MG tablet Take 1 tablet (40 mg total) by mouth daily. (Patient not taking: Reported on 09/03/2017) 90 tablet 0   No current facility-administered medications for this visit.     Functional Status: In your present state of health, do you have any difficulty performing the following activities: 08/17/2017 07/26/2017  Hearing? Y N  Comment difficulty hearing at times -  Vision? Y N  Comment seeing a diabetic eye specialist -  Difficulty concentrating or making decisions? Y N  Walking or climbing stairs? Y Y  Dressing or bathing? Y Y  Doing errands, shopping? [name]  Preparing  Food and eating ? Y -  Using the Toilet? Y -  In the past six months, have you accidently leaked urine? Y -  Do you have problems with loss of bowel control? Y -  Managing your Medications? Y -  Managing your Finances? Y -  Housekeeping or managing your Housekeeping? Y -  Some recent data might be hidden    Fall/Depression Screening: Fall Risk  08/17/2017 08/02/2017 06/29/2017  Falls in the past year? Yes Yes No  Number falls in past yr: 2 or more 2 or more -  Comment - - -  Injury with Fall? - No -  Comment - - -  Risk Factor Category  High Fall Risk - -  Risk for fall due to : History of fall(s);Impaired balance/gait;Impaired mobility - -  Follow up Education provided;Falls prevention discussed - -   PHQ 2/9 Scores 08/17/2017 06/25/2017 06/19/2017 06/08/2017 04/02/2017 03/24/2016 04/21/2015  PHQ - 2 Score - 0 0 0 3 3 6   PHQ- 9 Score - - - - 10 17 19   Exception Documentation Other- indicate reason in comment box - - - - - -  Not completed spoke with daughter, did not speak with patient - - - - - -   Assessment:  Drugs sorted by system:  Neurologic/Psychologic: clonazepam, duloxetine, gabapentin, meclizine,   Cardiovascular: hydralazine, isosorbide mononitrate, metoprolol succinate, nitroglycerin, pravastatin, Xarelto  Pulmonary/Allergy: albuterol, Symbicort, cetirizine  Gastrointestinal: bisacodyl, pantoprazole  Endocrine: Novolog, Toujeo,   Pain: Percoet  Vitamins/Minerals: Biotin, cholecalciferol  Miscellaneous: anastrozole, hydroxyzine  Medications to avoid in the elderly: pantoprazole, Xarelto, meclizine, hydroxyzine, clonazepam Drug interactions: none of clinical significance; Drug-allergy interaction: Percocet, tramadol allergy Other issues noted: high dose of Toujeo, uncontrolled T2DM   Plan: 1. Assess gout history for more affordable alternatives to Uloric. Assess patient's eligiblity for Uloric patient assistance Bernita Buffy).   2. Follow up in 3-4 business days.  Will route to Etter Sjogren, CPhT, at that time if needed for [facility] patient assistance application.   Charlett Lango, PharmD Clinical Pharmacist, [facility] [phone]

## 2017-09-05 DIAGNOSIS — E875 Hyperkalemia: Secondary | ICD-10-CM | POA: Diagnosis not present

## 2017-09-05 DIAGNOSIS — G8929 Other chronic pain: Secondary | ICD-10-CM | POA: Diagnosis not present

## 2017-09-05 DIAGNOSIS — R0689 Other abnormalities of breathing: Secondary | ICD-10-CM | POA: Diagnosis not present

## 2017-09-05 DIAGNOSIS — D649 Anemia, unspecified: Secondary | ICD-10-CM | POA: Diagnosis not present

## 2017-09-05 DIAGNOSIS — I5040 Unspecified combined systolic (congestive) and diastolic (congestive) heart failure: Secondary | ICD-10-CM | POA: Diagnosis not present

## 2017-09-05 DIAGNOSIS — M1990 Unspecified osteoarthritis, unspecified site: Secondary | ICD-10-CM | POA: Diagnosis not present

## 2017-09-05 DIAGNOSIS — E669 Obesity, unspecified: Secondary | ICD-10-CM | POA: Diagnosis not present

## 2017-09-07 ENCOUNTER — Encounter: Payer: Self-pay | Admitting: Internal Medicine

## 2017-09-07 ENCOUNTER — Other Ambulatory Visit: Payer: Self-pay | Admitting: Pharmacist

## 2017-09-07 ENCOUNTER — Ambulatory Visit: Payer: Self-pay | Admitting: Pharmacist

## 2017-09-07 ENCOUNTER — Ambulatory Visit (INDEPENDENT_AMBULATORY_CARE_PROVIDER_SITE_OTHER): Payer: PPO | Admitting: Internal Medicine

## 2017-09-07 VITALS — BP 128/76 | HR 77 | Temp 97.6°F | Ht 67.0 in | Wt 341.0 lb

## 2017-09-07 DIAGNOSIS — Z794 Long term (current) use of insulin: Secondary | ICD-10-CM | POA: Diagnosis not present

## 2017-09-07 DIAGNOSIS — J309 Allergic rhinitis, unspecified: Secondary | ICD-10-CM

## 2017-09-07 DIAGNOSIS — R413 Other amnesia: Secondary | ICD-10-CM | POA: Diagnosis not present

## 2017-09-07 DIAGNOSIS — E1122 Type 2 diabetes mellitus with diabetic chronic kidney disease: Secondary | ICD-10-CM

## 2017-09-07 DIAGNOSIS — N183 Chronic kidney disease, stage 3 unspecified: Secondary | ICD-10-CM

## 2017-09-07 DIAGNOSIS — D509 Iron deficiency anemia, unspecified: Secondary | ICD-10-CM | POA: Diagnosis not present

## 2017-09-07 DIAGNOSIS — E875 Hyperkalemia: Secondary | ICD-10-CM | POA: Diagnosis not present

## 2017-09-07 DIAGNOSIS — G894 Chronic pain syndrome: Secondary | ICD-10-CM | POA: Diagnosis not present

## 2017-09-07 LAB — BASIC METABOLIC PANEL WITH GFR
BUN/Creatinine Ratio: 19 (calc) (ref 6–22)
BUN: 27 mg/dL — ABNORMAL HIGH (ref 7–25)
CO2: 29 mmol/L (ref 20–32)
Calcium: 9 mg/dL (ref 8.6–10.4)
Chloride: 103 mmol/L (ref 98–110)
Creat: 1.41 mg/dL — ABNORMAL HIGH (ref 0.60–0.88)
GFR, Est African American: 41 mL/min/{1.73_m2} — ABNORMAL LOW (ref >=60)
GFR, Est Non African American: 35 mL/min/{1.73_m2} — ABNORMAL LOW (ref >=60)
Glucose, Bld: 212 mg/dL — ABNORMAL HIGH (ref 65–99)
Potassium: 4.9 mmol/L (ref 3.5–5.3)
Sodium: 138 mmol/L (ref 135–146)

## 2017-09-07 MED ORDER — HYDROXYZINE HCL 10 MG PO TABS: 10.0000 mg | ORAL_TABLET | Freq: Three times a day (TID) | ORAL | 11 refills | Status: DC | PRN

## 2017-09-07 MED ORDER — LANCET DEVICE MISC: 3 refills | Status: DC

## 2017-09-07 MED ORDER — MONTELUKAST SODIUM 10 MG PO TABS: 10.0000 mg | ORAL_TABLET | Freq: Every day | ORAL | 3 refills | Status: DC

## 2017-09-07 NOTE — Patient Outreach (Signed)
[facility]) Care Management  09/07/2017  [name] 08-23-1934 [id number]   83 y.o. year old female referred to [facility] for Medication Assistance (Follow up- Uloric) Referred by Hubert Azure, [place]  Patient with [facility] Medicare Advantage plan.   SUBJECTIVE:   Medication Assistance:  Patient is currently in the donut hole or gap in Medicare coverage.  [employer] operator) has a medication assistance program that the patient may qualify for.   Uloric is a tier 3 medication on [employer] formulary. Colcrys and Probenacid are both tier 2. Allopurinol is tier 1 but per review of patient's chart, allopurinol was ineffective for patient (Office Visit note 08/04/15).   Per conversation with Dr. Eulas Post, probenacid would be inappropriate for the patient due to the side effect profile. Dr. Eulas Post states the patient should be taking colchicine (this medication was not mentioned by patient's daughter during prior medication review). Dr. Eulas Post and [facility] pharmacist agree that another attempt at a medication review + home visit would be beneficial as it is difficult to get a clear medication history.    OBJECTIVE: Per report from [facility] Ad[employer], patient has spent 1252.69 out of pocket on prescription medications (year to date).    ASSESSMENT/PLAN:  1. Move forward with [employer] patient assistance, facilitated by Etter Sjogren, CPhT, for both Colcrys and Uloric.  2. Attempt a second medication review (with home visit) with patient as medication history is difficult to follow/obtain.  3. Follow up in 2-5 business days.   Charlett Lango, PharmD Clinical Pharmacist, [facility] [phone]

## 2017-09-07 NOTE — Progress Notes (Signed)
Patient ID: [name], female   DOB: [birth year], 83 y.o.   MRN: [id number]   Location:  [facility] OFFICE  Provider: DR Arletha Grippe  Code Status:  Goals of Care:  Advanced Directives 08/17/2017  Does Patient Have a Medical Advance Directive? No  Type of Advance Directive -  Does patient want to make changes to medical advance directive? -  Copy of [place] in Chart? -  Would patient like information on creating a medical advance directive? -  Pre-existing out of facility DNR order (yellow form or pink MOST form) -     Chief Complaint  Patient presents with  . Medical Management of Chronic Issues    3 month follow-up on DM, MDD/Anxiety and history of DVT/PE. Patient c/o of frequent headaches, sick on stomach( question if related to any medications), and everything is bothering her.   . Medication Management    Requesting mediction for constipation     HPI: Patient is a 83 y.o. female seen today for medical management of chronic diseases.  She was hospitalized in March for hyperkalemia (K 6.4). She is a poor historian due to dementia. Hx obtained from chart. She c/o severe parietal HA with associated nausea and productive cough with green sputum. Last CT head in 2017 showed chronic small vessel disease.   Hx RLE DVT/hx PE - stable on xeralto. No longer on coumadin.  DM - BS <150 most days. She gets Novolog 34 units BID; Toujeo 35 units BID. She has polydipsia and polyphagia. A1c 6.7%. No low BS reactions  Chronic bronchitis - improved SOB on symbicort. She has severe cough that is improved on diabetic Tussin  Dry skin dermatitis - itching improved with change to dye free/parfume free detergent. She takes atarax prn  Dementia - MMSE 26/30. Unchanged on namenda. She tried aricept but experienced GI upset/loose stools and stopped medication.  CKD - stage 3. Cr 1.69  AOCD - Hgb 9.1. Iron 29. She likes to eat ice   Past Medical History:  Diagnosis Date  .  Allergy    takes Mucinex daily as needed  . Anemia, unspecified   . Anxiety    takes Clonazepam daily as needed  . Arthritis   . CHF (congestive heart failure) (HCC)    takes Furosemide daily  . Coronary artery disease    a. s/p IMI 2004 tx with BMS to RCA;  b. s/p Promus DES to RCA 2/12 (cath: LM 20-30%, pLAD 20-30%, mLAD 50%, RI 30%, pRCA 60%, mRCA 90% - tx with PCI);  c. myoview 1/07: EF 49%, inf scar, no isch  . Coronary atherosclerosis of native coronary artery   . Depression    takes Cymbalta daily  . Diabetes mellitus    insulin daily  . Dysuria   . GERD (gastroesophageal reflux disease)    takes Protonix daily  . Gout, unspecified    takes Colchicine daily  . Headache    occasionally  . History of blood clots about 62yr ago   in legs-takes Coumadin   . Hyperlipidemia    takes Pravastatin daily  . Hypertension    takes Imdur and Metoprolol daily  . Insomnia   . Joint pain   . Joint swelling   . Myocardial infarction ([place] 2004  . Numbness   . Obstructive sleep apnea   . Osteoarthritis   . Osteoarthritis   . Osteoarthrosis, unspecified whether generalized or localized, lower leg   . Pain, chronic   .  Polymyalgia rheumatica ([place])   . Pulmonary emboli ([place]) 9/13   felt to need lifelong anticoagulation  . Shortness of breath dyspnea    with exertion and has Albuterol inhaler prn  . Type II or unspecified type diabetes mellitus without mention of complication, uncontrolled   . Urinary incontinence    takes Linzess daily  . Vertigo    hx of;was taking Meclizine if needed    Past Surgical History:  Procedure Laterality Date  . ABDOMINAL HYSTERECTOMY     partial  . APPENDECTOMY    . blood clots/legs and lungs  2013  . BREAST BIOPSY Left 07/22/2014  . BREAST BIOPSY Left 02/10/2013  . BREAST LUMPECTOMY Left 11/05/2014  . BREAST LUMPECTOMY WITH RADIOACTIVE SEED LOCALIZATION Left 11/05/2014   Procedure: LEFT BREAST LUMPECTOMY WITH RADIOACTIVE SEED LOCALIZATION;   Surgeon: [name], MD;  Location: [facility];  Service: General;  Laterality: Left;  . CARDIAC CATHETERIZATION    . COLONOSCOPY    . CORONARY ANGIOPLASTY  2  . ESOPHAGOGASTRODUODENOSCOPY (EGD) WITH PROPOFOL N/A 11/07/2016   Procedure: ESOPHAGOGASTRODUODENOSCOPY (EGD) WITH PROPOFOL;  Surgeon: Gatha Mayer, MD;  Location: [facility];  Service: Endoscopy;  Laterality: N/A;  . EXCISION OF SKIN TAG Right 11/05/2014   Procedure: EXCISION OF RIGHT EYELID SKIN TAG;  Surgeon: [name], MD;  Location: [place];  Service: General;  Laterality: Right;  . EYE SURGERY Bilateral    cataract   . GASTRIC BYPASS  1977    reversed in 1979, [place] N/A 06/29/2014   Procedure: LEFT HEART CATHETERIZATION WITH CORONARY ANGIOGRAM;  Surgeon: Troy Sine, MD;  Location: [facility] CATH LAB;  Service: Cardiovascular;  Laterality: N/A;  . MI with stent placement  2004     reports that she has never smoked. She has never used smokeless tobacco. She reports that she does not drink alcohol or use drugs. Social History   Socioeconomic History  . Marital status: Widowed    Spouse name: Not on file  . Number of children: Not on file  . Years of education: Not on file  . Highest education level: Not on file  Occupational History  . Occupation: retired  Scientific laboratory technician  . Financial resource strain: Not hard at all  . Food insecurity:    Worry: Never true    Inability: Never true  . Transportation needs:    Medical: Yes    Non-medical: Yes  Tobacco Use  . Smoking status: Never Smoker  . Smokeless tobacco: Never Used  Substance and Sexual Activity  . Alcohol use: No    Alcohol/week: 0.0 oz  . Drug use: No  . Sexual activity: Not Currently  Lifestyle  . Physical activity:    Days per week: 0 days    Minutes per session: 0 min  . Stress: Rather much  Relationships  . Social connections:    Talks on phone: Once a week    Gets together: Three  times a week    Attends religious service: 1 to 4 times per year    Active member of club or organization: Yes    Attends meetings of clubs or organizations: 1 to 4 times per year    Relationship status: Widowed  . Intimate partner violence:    Fear of current or ex partner: No    Emotionally abused: No    Physically abused: No    Forced sexual activity: No  Other Topics Concern  .  Not on file  Social History Narrative  . Not on file    Family History  Problem Relation Age of Onset  . Breast cancer Mother 53  . Heart disease Mother   . Throat cancer Father   . Hypertension Father   . Arthritis Father   . Diabetes Father   . Arthritis Sister   . Obesity Sister   . Diabetes Sister   . Heart disease Cousin   . Colon cancer Neg Hx   . Stomach cancer Neg Hx   . Esophageal cancer Neg Hx     Allergies  Allergen Reactions  . Sulfonamide Derivatives Swelling    Mouth swelling  . Tramadol Nausea And Vomiting  . Aricept [Donepezil Hcl]     GI upset/loose stools    Outpatient Encounter Medications as of 09/07/2017  Medication Sig  . albuterol (PROVENTIL HFA;VENTOLIN HFA) 108 (90 Base) MCG/ACT inhaler Inhale 1-2 puffs into the lungs every 6 (six) hours as needed for wheezing or shortness of breath.  . anastrozole (ARIMIDEX) 1 MG tablet Take 1 tablet (1 mg total) by mouth daily.  . Biotin 10000 MCG TABS Take 1 tablet by mouth daily.  . budesonide-formoterol (SYMBICORT) 160-4.5 MCG/ACT inhaler Inhale 2 puffs into the lungs 2 (two) times daily. For bronchitis  . cetirizine (ZYRTEC) 10 MG tablet Take 10 mg by mouth at bedtime as needed for allergies.   . Cholecalciferol (VITAMIN D3) 2000 units TABS Take 2 tablets by mouth.   . clonazePAM (KLONOPIN) 1 MG tablet Take 2 tablets (2 mg total) by mouth at bedtime. For anxiety  . DULoxetine (CYMBALTA) 60 MG capsule Take one capsule by mouth once daily for anxiety  . febuxostat (ULORIC) 40 MG tablet Take 1 tablet (40 mg total) by mouth  daily.  [name] glucose blood (ONETOUCH VERIO) test strip USE 1 STRIP TO CHECK GLUCOSE THREE TIMES DAILY  E11.21  . hydrALAZINE (APRESOLINE) 10 MG tablet Take 1 tablet (10 mg total) by mouth every 8 (eight) hours.  . insulin aspart (NOVOLOG FLEXPEN) 100 UNIT/ML FlexPen Before each meal 3 times a day, 140-199 - 2 units, 200-250 - 4 units, 251-299 - 6 units,  300-349 - 8 units,  350 or above 10 units. Insulin PEN if approved, provide syringes and needles if needed.  . Insulin Glargine (TOUJEO SOLOSTAR) 300 UNIT/ML SOPN Inject 35 Units into the skin 2 (two) times daily. (before breakfast and before supper)  . isosorbide mononitrate (IMDUR) 60 MG 24 hr tablet Take one tablet by mouth once daily  . Lancet Device MISC Onetouch verio machine lancets, check blood sugar twice daily as directed DX E11.69  . meclizine (ANTIVERT) 25 MG tablet TAKE 1 TABLET BY MOUTH 3 TIMES DAILY AS NEEDED FOR DIZZINESS  . metoprolol succinate (TOPROL-XL) 25 MG 24 hr tablet Take one tablet by mouth once daily for blood pressure  . nitroGLYCERIN (NITROSTAT) 0.4 MG SL tablet Take one tablet under the tongue every 5 minutes as needed for chest pain  . oxyCODONE-acetaminophen (PERCOCET) 10-325 MG tablet Take 1 tablet by mouth every 6 (six) hours.  . pantoprazole (PROTONIX) 40 MG tablet Take 1 tablet (40 mg total) by mouth daily.  . pravastatin (PRAVACHOL) 20 MG tablet Take one tablet by mouth once daily for cholesterol (Patient taking differently: Take 20 mg by mouth daily. )  . rivaroxaban (XARELTO) 10 MG TABS tablet Take 1 tablet (10 mg total) by mouth daily.  . sodium chloride (OCEAN) 0.65 % SOLN nasal spray Place 1  spray into both nostrils 3 (three) times daily as needed (for sinus congestion.). RINSE AS NEEDED FOR SINUS CONGESTION   . Spacer/Aero-Holding Chambers (AEROCHAMBER PLUS FLO-VU LARGE) MISC 1 each by Other route once.  . [DISCONTINUED] hydrOXYzine (ATARAX/VISTARIL) 10 MG tablet Take 1 tablet (10 mg total) by mouth 3 (three)  times daily as needed for itching.  . [DISCONTINUED] Lancet Device MISC by Does not apply route. Onetouch verio machine lancets, check blood sugar twice daily as directed DX E11.69  . estradiol (ESTRACE) 0.1 MG/GM vaginal cream Use every other evening use in vaginal area  . hydrOXYzine (ATARAX/VISTARIL) 10 MG tablet Take 1 tablet (10 mg total) by mouth 3 (three) times daily as needed for itching.  . montelukast (SINGULAIR) 10 MG tablet Take 1 tablet (10 mg total) by mouth at bedtime. For seasonal allergy  . [DISCONTINUED] bisacodyl (DULCOLAX) 5 MG EC tablet Take 5 mg by mouth daily as needed for moderate constipation.  . [DISCONTINUED] docusate sodium (COLACE) 100 MG capsule Take 100 mg by mouth daily as needed for mild constipation. TAKE PER BOTTLE AS NEEDED FOR CONSTIPATION   . [DISCONTINUED] gabapentin (NEURONTIN) 100 MG capsule Take 1 capsule (100 mg total) by mouth at bedtime. (Patient not taking: Reported on 09/07/2017)  . [DISCONTINUED] Lancets (ONETOUCH ULTRASOFT) lancets Check blood sugar up to three time daily. Dx:E11.9  . [DISCONTINUED] memantine (NAMENDA) 5 MG tablet Take 2 tabs po every AM and 1 tab po every PM for memory (Patient taking differently: Take 5-10 mg by mouth 2 (two) times daily. Take 2 tabs po every AM and 1 tab po every PM for memory)   No facility-administered encounter medications on file as of 09/07/2017.     Review of Systems:  Review of Systems  Unable to perform ROS: Other (memory loss)    Health Maintenance  Topic Date Due  . OPHTHALMOLOGY EXAM  04/25/2017  . MAMMOGRAM  08/16/2017  . INFLUENZA VACCINE  12/06/2017  . FOOT EXAM  12/12/2017  . HEMOGLOBIN A1C  01/27/2018  . TETANUS/TDAP  05/08/2018  . URINE MICROALBUMIN  06/14/2018  . DEXA SCAN  Completed  . PNA vac Low Risk Adult  Completed    Physical Exam: Vitals:   09/07/17 1210  BP: 128/76  Pulse: 77  Temp: 97.6 F (36.4 C)  TempSrc: Oral  SpO2: 96%  Weight: (!) 341 lb (154.7 kg)  Height: 5'  7" (1.702 m)   Body mass index is 53.41 kg/m. Physical Exam  Constitutional: She is oriented to person, place, and time. She appears well-developed and well-nourished. She appears lethargic.  HENT:  Mouth/Throat: Oropharynx is clear and moist. No oropharyngeal exudate.  Maxillary sinus TTP L>R with boggy tissue texture changes; MMM; oropharynx cobblestoning but no redness  Eyes: Pupils are equal, round, and reactive to light. No scleral icterus.  Neck: Neck supple. Carotid bruit is not present. No tracheal deviation present. No thyromegaly present.  Cardiovascular: Normal rate, regular rhythm and intact distal pulses. Exam reveals no gallop and no friction rub.  Murmur (1/6 SEM) heard. No LE edema b/l. no calf TTP.   Pulmonary/Chest: Effort normal and breath sounds normal. No stridor. No respiratory distress. She has no wheezes. She has no rales.  Abdominal: Soft. Bowel sounds are normal. She exhibits no distension and no mass. There is no hepatomegaly. There is no tenderness. There is no rebound and no guarding.  obese  Lymphadenopathy:    She has no cervical adenopathy.  Neurological: She is oriented to person,  place, and time. She has normal reflexes. She appears lethargic.  Skin: Skin is warm and dry. No rash noted.  Psychiatric: She has a normal mood and affect. Her behavior is normal.    Labs reviewed: Basic Metabolic Panel: Recent Labs    07/28/17 0507 08/02/17 1346 08/27/17 0018  NA 141 141 133*  K 4.8 4.8 5.2*  CL 104 105 102  CO2 '28 29 22  ' GLUCOSE 113* 137 580*  BUN 32* 27* 32*  CREATININE 1.57* 1.59* 1.69*  CALCIUM 8.6* 8.8 9.1   Liver Function Tests: Recent Labs    09/20/16 0923 01/16/17 1516 03/21/17 1351 06/08/17 1015 07/25/17 1815  AST '11 15 12  ' --  23  ALT '12 9 10 9 14  ' ALKPHOS 92  --  77  --  83  BILITOT 0.44 0.3 0.30  --  0.4  PROT 7.5 7.4 7.9  --  8.8*  ALBUMIN 2.8*  --  2.8*  --  3.1*   No results for input(s): LIPASE, AMYLASE in the last  8760 hours. No results for input(s): AMMONIA in the last 8760 hours. CBC: Recent Labs    07/27/17 0516 07/28/17 0507 08/02/17 1346 08/27/17 0018  WBC 12.4* 11.1* 10.1 11.2*  NEUTROABS 7.8* 6.5 6,929  --   HGB 7.7* 7.9* 8.2* 9.1*  HCT 26.1* 27.0* 25.5* 29.8*  MCV 84.7 86.0 79.9* 83.5  PLT 323 317 304 307   Lipid Panel: Recent Labs    10/13/16 1230 01/16/17 1516 06/08/17 1015  CHOL 99 114 92  HDL 34* 38* 38*  LDLCALC 41 53 37  TRIG 118 149 89  CHOLHDL 2.9 3.0 2.4   Lab Results  Component Value Date   HGBA1C 6.7 (H) 07/27/2017    Procedures since last visit: No results found.  Assessment/Plan   ICD-10-CM   1. Hyperkalemia E87.5 BMP with eGFR([facility])  2. Type 2 diabetes mellitus with stage 3 chronic kidney disease, with long-term current use of insulin (HCC) E11.22 Lancet Device MISC   N18.3    Z79.4   3. Allergic rhinitis, unspecified seasonality, unspecified trigger J30.9 montelukast (SINGULAIR) 10 MG tablet  4. Memory loss R41.3   5. Microcytic anemia D50.9    likely iron deficient  6. Chronic pain syndrome G89.4    PLEASE CHECK WITH INSURANCE COMPANY TO SEE IF FREESTYLE LIBRE DEVICE IS COVERED - it would reduce frequent finger sticks  Will call with lab results  START SINGULAIR 10MG AT BEDTIME FOR SEASONAL ALLERGY  Continue other medications as ordered  Reduce potassium in diet  Follow up in 3 mos for DM, HTN, hyperlipidemia, chronic bronchitis - will need a1c, bmp, alt, cbc, lipid panel     Jayven Naill S. Perlie Gold  [facility] [address] [place], [place] [address] [phone] Cell (Monday-Friday 8 AM - 5 PM) [phone] After 5 PM and follow prompts

## 2017-09-07 NOTE — Patient Instructions (Addendum)
PLEASE CHECK WITH INSURANCE COMPANY TO SEE IF FREESTYLE LIBRE DEVICE IS COVERED - it would reduce frequent finger sticks  Will call with lab results  START SINGULAIR 10MG  AT BEDTIME FOR SEASONAL ALLERGY  Continue other medications as ordered  Reduce potassium in diet  Follow up in 3 mos for DM, HTN, hyperlipidemia, chronic bronchitis   Hyperkalemia Hyperkalemia is when you have too much potassium in your blood. Potassium is normally removed (excreted) from your body by your kidneys. If there is too much potassium in your blood, it can affect how your heart works. Follow these instructions at home:  Take medicines only as told by your doctor.  Do not take any supplements, natural products, herbs, or vitamins unless your doctor says it is okay.  Limit your alcohol intake as told by your doctor.  Stop illegal drug use. If you need help quitting, ask your doctor.  Keep all follow-up visits as told by your doctor. This is important.  If you have kidney disease, you may need to follow a low potassium diet. A food specialist (dietitian) can help you. Contact a doctor if:  Your heartbeat is not regular or very slow.  You feel dizzy (light-headed).  You feel weak.  You feel sick to your stomach (nauseous).  You have tingling in your hands or feet.  You cannot feel your hands or feet. Get help right away if:  You are short of breath.  You have chest pain.  You pass out (faint).  You cannot move your muscles. This information is not intended to replace advice given to you by your health care provider. Make sure you discuss any questions you have with your health care provider. Document Released: 04/24/2005 Document Revised: 09/30/2015 Document Reviewed: 07/30/2013 Elsevier Interactive Patient Education  2018 [employer].

## 2017-09-10 ENCOUNTER — Telehealth: Payer: Self-pay | Admitting: *Deleted

## 2017-09-10 DIAGNOSIS — F329 Major depressive disorder, single episode, unspecified: Secondary | ICD-10-CM | POA: Diagnosis not present

## 2017-09-10 DIAGNOSIS — I2510 Atherosclerotic heart disease of native coronary artery without angina pectoris: Secondary | ICD-10-CM | POA: Diagnosis not present

## 2017-09-10 DIAGNOSIS — I130 Hypertensive heart and chronic kidney disease with heart failure and stage 1 through stage 4 chronic kidney disease, or unspecified chronic kidney disease: Secondary | ICD-10-CM | POA: Diagnosis not present

## 2017-09-10 DIAGNOSIS — I252 Old myocardial infarction: Secondary | ICD-10-CM | POA: Diagnosis not present

## 2017-09-10 DIAGNOSIS — M1991 Primary osteoarthritis, unspecified site: Secondary | ICD-10-CM | POA: Diagnosis not present

## 2017-09-10 DIAGNOSIS — I5032 Chronic diastolic (congestive) heart failure: Secondary | ICD-10-CM | POA: Diagnosis not present

## 2017-09-10 DIAGNOSIS — N183 Chronic kidney disease, stage 3 (moderate): Secondary | ICD-10-CM | POA: Diagnosis not present

## 2017-09-10 DIAGNOSIS — M353 Polymyalgia rheumatica: Secondary | ICD-10-CM | POA: Diagnosis not present

## 2017-09-10 DIAGNOSIS — C50919 Malignant neoplasm of unspecified site of unspecified female breast: Secondary | ICD-10-CM | POA: Diagnosis not present

## 2017-09-10 DIAGNOSIS — E1122 Type 2 diabetes mellitus with diabetic chronic kidney disease: Secondary | ICD-10-CM | POA: Diagnosis not present

## 2017-09-10 DIAGNOSIS — F0390 Unspecified dementia without behavioral disturbance: Secondary | ICD-10-CM | POA: Diagnosis not present

## 2017-09-10 DIAGNOSIS — J449 Chronic obstructive pulmonary disease, unspecified: Secondary | ICD-10-CM | POA: Diagnosis not present

## 2017-09-10 DIAGNOSIS — M109 Gout, unspecified: Secondary | ICD-10-CM | POA: Diagnosis not present

## 2017-09-10 NOTE — Telephone Encounter (Signed)
Received form from [facility] for Patient Assistance for [name]. Placed in Dr. [place] folder to review.  Printed last OV note and attached.

## 2017-09-11 ENCOUNTER — Ambulatory Visit: Payer: Self-pay | Admitting: Pharmacist

## 2017-09-11 ENCOUNTER — Other Ambulatory Visit: Payer: Self-pay | Admitting: Pharmacist

## 2017-09-11 NOTE — Patient Outreach (Signed)
[place] [facility]) Care Management  09/11/2017  [name] 1934-05-10 [id number]   83 y.o. year old female referred to [place] for Medication Assistance and Medication Management  Was unable to reach patient via telephone today and have left HIPAA compliant voicemail asking patient to return my call. (unsuccessful outreach #1).  Attempting to follow up with patient regarding more medication assistance programs upon receiving full TROOP report from [facility]. Patient qualifies for medication assistance via [employer] ([name]) and [employer] (Novolog [place]).   Was also trying to set up a home visit for medication review. Previous medication review was completed over the phone, but it was difficult to get a full history. Speaking with Dr. Eulas Post, we both agree a home visit would be helpful.    Plan: Will follow-up within 2-4  business days via telephone.   Charlett Lango, PharmD Clinical Pharmacist, [place] [facility] [phone]

## 2017-09-17 ENCOUNTER — Other Ambulatory Visit: Payer: Self-pay | Admitting: Pharmacy Technician

## 2017-09-17 ENCOUNTER — Other Ambulatory Visit: Payer: Self-pay | Admitting: *Deleted

## 2017-09-17 DIAGNOSIS — E1122 Type 2 diabetes mellitus with diabetic chronic kidney disease: Secondary | ICD-10-CM | POA: Diagnosis not present

## 2017-09-17 DIAGNOSIS — N183 Chronic kidney disease, stage 3 (moderate): Secondary | ICD-10-CM | POA: Diagnosis not present

## 2017-09-17 DIAGNOSIS — I5032 Chronic diastolic (congestive) heart failure: Secondary | ICD-10-CM | POA: Diagnosis not present

## 2017-09-17 DIAGNOSIS — C50919 Malignant neoplasm of unspecified site of unspecified female breast: Secondary | ICD-10-CM | POA: Diagnosis not present

## 2017-09-17 DIAGNOSIS — I2510 Atherosclerotic heart disease of native coronary artery without angina pectoris: Secondary | ICD-10-CM | POA: Diagnosis not present

## 2017-09-17 DIAGNOSIS — I252 Old myocardial infarction: Secondary | ICD-10-CM | POA: Diagnosis not present

## 2017-09-17 DIAGNOSIS — F329 Major depressive disorder, single episode, unspecified: Secondary | ICD-10-CM | POA: Diagnosis not present

## 2017-09-17 DIAGNOSIS — M109 Gout, unspecified: Secondary | ICD-10-CM | POA: Diagnosis not present

## 2017-09-17 DIAGNOSIS — G894 Chronic pain syndrome: Secondary | ICD-10-CM

## 2017-09-17 DIAGNOSIS — M353 Polymyalgia rheumatica: Secondary | ICD-10-CM | POA: Diagnosis not present

## 2017-09-17 DIAGNOSIS — M1991 Primary osteoarthritis, unspecified site: Secondary | ICD-10-CM | POA: Diagnosis not present

## 2017-09-17 DIAGNOSIS — F0390 Unspecified dementia without behavioral disturbance: Secondary | ICD-10-CM | POA: Diagnosis not present

## 2017-09-17 DIAGNOSIS — J449 Chronic obstructive pulmonary disease, unspecified: Secondary | ICD-10-CM | POA: Diagnosis not present

## 2017-09-17 DIAGNOSIS — I130 Hypertensive heart and chronic kidney disease with heart failure and stage 1 through stage 4 chronic kidney disease, or unspecified chronic kidney disease: Secondary | ICD-10-CM | POA: Diagnosis not present

## 2017-09-17 NOTE — Patient Outreach (Signed)
[place] [facility]) Care Management  09/17/2017  FARAH LEPAK Sep 30, 1934 [id number]   Successful outreach call to patients daughter, [name], HIPAA identifiers verified. Contacted [name] in regards to receiving [employer] patient assistance application. [name] states she does not think they have received it however, she would check when she gets home and call me back. I also informed [name] that RPh had tried to contact her multiple times in regards to other possible patient needs. She requested Julie's and stated she would call her back as well.  Will follow up with [name] in 5-7 days, if call is not returned.  Maud Deed [place], [place] [facility] [phone]

## 2017-09-17 NOTE — Telephone Encounter (Signed)
Patient called and requested refill NCCSRS Database Verified LR: 08/16/2017 Pharmacy Confirmed Pended Rx and sent to Dr. Eulas Post for approval.

## 2017-09-18 ENCOUNTER — Other Ambulatory Visit: Payer: Self-pay | Admitting: Pharmacist

## 2017-09-18 ENCOUNTER — Other Ambulatory Visit: Payer: Self-pay

## 2017-09-18 DIAGNOSIS — G894 Chronic pain syndrome: Secondary | ICD-10-CM

## 2017-09-18 MED ORDER — OXYCODONE-ACETAMINOPHEN 10-325 MG PO TABS: 1.0000 | ORAL_TABLET | Freq: Four times a day (QID) | ORAL | 0 refills | Status: DC

## 2017-09-18 NOTE — Patient Outreach (Signed)
[place]) Care Management  09/18/2017  [name] 1934-11-27 [id number]   83 y.o. year old female referred to [place] for Medication Assistance and Medication Management   Was unable to reach patient's daughter, [name] via telephone today and have left HIPAA compliant voicemail asking [name] to return my call. (unsuccessful outreach #2).  Calling to review medication assistance options and to schedule a home visit (per recommendation from Dr. Eulas Post)  Plan: Will send unsuccessful outreach letter. Lack of response is complex as [facility] pharmacy technician Etter Sjogren is facilitating medication assistance for patient. Will take that under advisement and not close pharmacy episode until well documented lack of contact per protocol.   Charlett Lango, PharmD Clinical Pharmacist, [place] [facility] [phone]

## 2017-09-18 NOTE — Telephone Encounter (Signed)
The original Rx that was sent to the pharmacy this morning failed due to transmission error.   Rx was pended to provider for approval.

## 2017-09-19 ENCOUNTER — Inpatient Hospital Stay: Payer: PPO

## 2017-09-19 ENCOUNTER — Telehealth: Payer: Self-pay

## 2017-09-19 ENCOUNTER — Inpatient Hospital Stay: Payer: PPO | Attending: Hematology | Admitting: Hematology

## 2017-09-19 ENCOUNTER — Other Ambulatory Visit: Payer: Self-pay | Admitting: Internal Medicine

## 2017-09-19 DIAGNOSIS — F418 Other specified anxiety disorders: Secondary | ICD-10-CM

## 2017-09-19 NOTE — Telephone Encounter (Signed)
Called pt to discuss missed appt today. Rescheduled for 10/09/17. Lab order expected date changed accordingly. Pt marked as no show today.

## 2017-09-19 NOTE — Telephone Encounter (Signed)
A medication refill was received from pharmacy for clonazepam 1 mg, isosorbide 60 mg, pravastatin 20 mg, and metoprolol 25 mg. Rx was called in to pharmacy after verifying last fill date, provider, and quantity on PMP [place].

## 2017-09-20 ENCOUNTER — Other Ambulatory Visit: Payer: Self-pay | Admitting: *Deleted

## 2017-09-20 ENCOUNTER — Encounter: Payer: Self-pay | Admitting: *Deleted

## 2017-09-20 MED ORDER — MECLIZINE HCL 25 MG PO TABS: ORAL_TABLET | ORAL | 0 refills | Status: DC

## 2017-09-20 NOTE — Patient Outreach (Signed)
[facility] [place] [facility]) Care Management  09/20/2017  [name] [birth year] [id number]   RN Health Coach Monthly Outreach  Referral Date:  08/01/2017 Referral Source:  EMMI General Discharge Screening Reason for Referral:  Disease Management Education Insurance:  [employer]   Outreach Attempt: Successful telephone outreach to patient for monthly telephone outreach.  HIPAA verified with patient.  Patient states she is doing better.  Reported she had not taken her fasting morning blood sugar as of yet because she was still in bed.  Patient audibly wheezy while talking over telephone.  When asked about shortness of breath, patient responded her shortness of breath "has gotten better".  Patient requested RN Health Coach call and speak with daughter [name].  Margaret Pyle.  HIPAA confirmed with [name].  She states patient is doing well.  Acknowledges Nurse Line call the end of April concerning high blood pressure reading, but states patient's blood pressures have been better controlled (SBP <150's).  Reports fasting blood sugars have been greater than 200's.  Denies any hypoglycemic events.  Daughter states patient weighs about once a week and last weight was 341 pounds. Encouraged to weight patient more often to monitor fluid status.  Discussed with daughter, [name], when [place] was speaking with patient she was wheezy.  Daughter states patient does get short of breath with movement, but felt she needed her inhaler this morning.  Stated she would give patient and sister a call to assess respiratory status and ask sister to give patient her morning inhaler.  Prior to end of conversation, daughter requested to call RN Health Coach back.  Appointments:  Reports last seeing primary care provider on 09/07/2017 and has scheduled follow up appointment on 12/11/2017.  Plan: RN Health Coach will await return call from daughter.  If no return call, [place] will make next  monthly telephone outreach in the month of June.  [facility] [phone] [name]

## 2017-09-20 NOTE — Telephone Encounter (Signed)
[facility] 

## 2017-09-21 ENCOUNTER — Telehealth: Payer: Self-pay | Admitting: *Deleted

## 2017-09-21 ENCOUNTER — Telehealth: Payer: Self-pay | Admitting: Internal Medicine

## 2017-09-21 NOTE — Telephone Encounter (Signed)
Patient called and stated that she was having black diarrhea and burning. Started early this morning.  Patient follows with GI Dr. Carlean Purl. Instructed patient to call her GI Dr. Patient agreed. Instructed her that if GI could not see her today she needed to go to the ER/Urgent Care. Patient agreed.

## 2017-09-21 NOTE — Telephone Encounter (Signed)
Daughter reports that patient has a one week history of stomach upset and black diarrhea Patient has been taking peptobismol due to stomach upset.  Daughter reports that her stools are no longer black as they have been.  I explained that the Pepto will turn the stools black.  Daughter reports that her stools are brown today.  She will try imodium over the weekend and call us next week if this fails to control her symptoms.  He major concern is she is also c/o urinary frequency, burning, and incontinence.  She is asked to contact her primary care about this.   She understands to call us back next week if she is still having diarrhea and we will have her see one of the APPS.

## 2017-09-24 ENCOUNTER — Ambulatory Visit: Payer: PPO | Admitting: Nurse Practitioner

## 2017-09-24 ENCOUNTER — Encounter: Payer: Self-pay | Admitting: Nurse Practitioner

## 2017-09-24 ENCOUNTER — Ambulatory Visit (INDEPENDENT_AMBULATORY_CARE_PROVIDER_SITE_OTHER): Payer: PPO | Admitting: Nurse Practitioner

## 2017-09-24 DIAGNOSIS — R300 Dysuria: Secondary | ICD-10-CM | POA: Diagnosis not present

## 2017-09-24 NOTE — Progress Notes (Signed)
Careteam: Patient Care Team: Gildardo Cranker, DO as PCP - General (Internal Medicine) [name], [name], MD (Inactive) as Consulting Physician (Cardiology) [name], MD as Consulting Physician (Ophthalmology) [name], MD as Consulting Physician (General Surgery) [name], MD as Referring Physician (Obstetrics and Gynecology) Leona Singleton, RN as [place] Management Bertis Ruddy, [facility] as [place] Management (Pharmacist) Adaline Sill, CPhT as [place] Management (Pharmacy Technician)  Advanced Directive information    Allergies  Allergen Reactions  . Sulfonamide Derivatives Swelling    Mouth swelling  . Tramadol Nausea And Vomiting  . Aricept [Donepezil Hcl]     GI upset/loose stools    Chief Complaint  Patient presents with  . Acute Visit    Possible UTI. Pain with urination. Patient mentioned that the symptoms started on Saturday.   . Medication Refill    No refills needed at this time     HPI: Patient is a 83 y.o. female seen in the office today due to pain with urination  For the last 3 days she has had more pain with urination. No abdominal pain. No fever. No increase in frequency at this time. No increase in urgency. No fever or chills. No flank pain. Pt had diarrhea last week and was very sick. Diarrhea has resolved. Had some bowel incontinence with diarrhea.  Drinks a lot of water at baseline.  Review of Systems:  Review of Systems  Constitutional: Negative for chills, fever and malaise/fatigue.  Respiratory: Negative for shortness of breath.   Cardiovascular: Negative for chest pain and palpitations.  Genitourinary: Positive for dysuria. Negative for flank pain, frequency, hematuria and urgency.    Past Medical History:  Diagnosis Date  . Allergy    takes Mucinex daily as needed  . Anemia, unspecified   . Anxiety    takes Clonazepam daily as needed  . Arthritis     . CHF (congestive heart failure) (HCC)    takes Furosemide daily  . Coronary artery disease    a. s/p IMI 2004 tx with BMS to RCA;  b. s/p Promus DES to RCA 2/12 (cath: LM 20-30%, pLAD 20-30%, mLAD 50%, RI 30%, pRCA 60%, mRCA 90% - tx with PCI);  c. myoview 1/07: EF 49%, inf scar, no isch  . Coronary atherosclerosis of native coronary artery   . Depression    takes Cymbalta daily  . Diabetes mellitus    insulin daily  . Dysuria   . GERD (gastroesophageal reflux disease)    takes Protonix daily  . Gout, unspecified    takes Colchicine daily  . Headache    occasionally  . History of blood clots about 24yrs ago   in legs-takes Coumadin   . Hyperlipidemia    takes Pravastatin daily  . Hypertension    takes Imdur and Metoprolol daily  . Insomnia   . Joint pain   . Joint swelling   . Myocardial infarction ([place]) 2004  . Numbness   . Obstructive sleep apnea   . Osteoarthritis   . Osteoarthritis   . Osteoarthrosis, unspecified whether generalized or localized, lower leg   . Pain, chronic   . Polymyalgia rheumatica ([place])   . Pulmonary emboli ([place]) 9/13   felt to need lifelong anticoagulation  . Shortness of breath dyspnea    with exertion and has Albuterol inhaler prn  . Type II or unspecified type diabetes mellitus without mention of complication, uncontrolled   . Urinary  incontinence    takes Linzess daily  . Vertigo    hx of;was taking Meclizine if needed   Past Surgical History:  Procedure Laterality Date  . ABDOMINAL HYSTERECTOMY     partial  . APPENDECTOMY    . blood clots/legs and lungs  2013  . BREAST BIOPSY Left 07/22/2014  . BREAST BIOPSY Left 02/10/2013  . BREAST LUMPECTOMY Left 11/05/2014  . BREAST LUMPECTOMY WITH RADIOACTIVE SEED LOCALIZATION Left 11/05/2014   Procedure: LEFT BREAST LUMPECTOMY WITH RADIOACTIVE SEED LOCALIZATION;  Surgeon: [name], MD;  Location: [facility];  Service: General;  Laterality: Left;  . CARDIAC CATHETERIZATION    . COLONOSCOPY     . CORONARY ANGIOPLASTY  2  . ESOPHAGOGASTRODUODENOSCOPY (EGD) WITH PROPOFOL N/A 11/07/2016   Procedure: ESOPHAGOGASTRODUODENOSCOPY (EGD) WITH PROPOFOL;  Surgeon: Gatha Mayer, MD;  Location: [facility];  Service: Endoscopy;  Laterality: N/A;  . EXCISION OF SKIN TAG Right 11/05/2014   Procedure: EXCISION OF RIGHT EYELID SKIN TAG;  Surgeon: [name], MD;  Location: [facility];  Service: General;  Laterality: Right;  . EYE SURGERY Bilateral    cataract   . GASTRIC BYPASS  1977    reversed in 1979, [place] N/A 06/29/2014   Procedure: LEFT HEART CATHETERIZATION WITH CORONARY ANGIOGRAM;  Surgeon: Troy Sine, MD;  Location: [facility] CATH LAB;  Service: Cardiovascular;  Laterality: N/A;  . MI with stent placement  2004   Social History:   reports that she has never smoked. She has never used smokeless tobacco. She reports that she does not drink alcohol or use drugs.  Family History  Problem Relation Age of Onset  . Breast cancer Mother 72  . Heart disease Mother   . Throat cancer Father   . Hypertension Father   . Arthritis Father   . Diabetes Father   . Arthritis Sister   . Obesity Sister   . Diabetes Sister   . Heart disease Cousin   . Colon cancer Neg Hx   . Stomach cancer Neg Hx   . Esophageal cancer Neg Hx     Medications: Patient's Medications  New Prescriptions   No medications on file  Previous Medications   ALBUTEROL (PROVENTIL HFA;VENTOLIN HFA) 108 (90 BASE) MCG/ACT INHALER    Inhale 1-2 puffs into the lungs every 6 (six) hours as needed for wheezing or shortness of breath.   ANASTROZOLE (ARIMIDEX) 1 MG TABLET    Take 1 tablet (1 mg total) by mouth daily.   BIOTIN 99833 MCG TABS    Take 1 tablet by mouth daily.   BUDESONIDE-FORMOTEROL (SYMBICORT) 160-4.5 MCG/ACT INHALER    Inhale 2 puffs into the lungs 2 (two) times daily. For bronchitis   CETIRIZINE (ZYRTEC) 10 MG TABLET    Take 10 mg by mouth at bedtime as  needed for allergies.    CHOLECALCIFEROL (VITAMIN D3) 2000 UNITS TABS    Take 2 tablets by mouth.    CLONAZEPAM (KLONOPIN) 1 MG TABLET    TAKE 2 TABLETS BY MOUTH AT BEDTIME FOR ANXIETY   DULOXETINE (CYMBALTA) 60 MG CAPSULE    Take one capsule by mouth once daily for anxiety   ESTRADIOL (ESTRACE) 0.1 MG/GM VAGINAL CREAM    Use every other evening use in vaginal area   FEBUXOSTAT (ULORIC) 40 MG TABLET    Take 1 tablet (40 mg total) by mouth daily.   GLUCOSE BLOOD (ONETOUCH VERIO) TEST STRIP    USE 1  STRIP TO CHECK GLUCOSE THREE TIMES DAILY  E11.21   HYDRALAZINE (APRESOLINE) 10 MG TABLET    Take 1 tablet (10 mg total) by mouth every 8 (eight) hours.   HYDROXYZINE (ATARAX/VISTARIL) 10 MG TABLET    Take 1 tablet (10 mg total) by mouth 3 (three) times daily as needed for itching.   INSULIN ASPART (NOVOLOG FLEXPEN) 100 UNIT/ML FLEXPEN    Before each meal 3 times a day, 140-199 - 2 units, 200-250 - 4 units, 251-299 - 6 units,  300-349 - 8 units,  350 or above 10 units. Insulin PEN if approved, provide syringes and needles if needed.   INSULIN GLARGINE (TOUJEO SOLOSTAR) 300 UNIT/ML SOPN    Inject 35 Units into the skin 2 (two) times daily. (before breakfast and before supper)   ISOSORBIDE MONONITRATE (IMDUR) 60 MG 24 HR TABLET    TAKE 1 TABLET BY MOUTH EVERY DAY   LANCET DEVICE MISC    Onetouch verio machine lancets, check blood sugar twice daily as directed DX E11.69   MECLIZINE (ANTIVERT) 25 MG TABLET    Take one tablet by mouth three times daily as needed for dizziness   METOPROLOL SUCCINATE (TOPROL-XL) 25 MG 24 HR TABLET    TAKE 1 TABLET BY MOUTH EVERY DAY FOR blood pressure   MONTELUKAST (SINGULAIR) 10 MG TABLET    Take 1 tablet (10 mg total) by mouth at bedtime. For seasonal allergy   NITROGLYCERIN (NITROSTAT) 0.4 MG SL TABLET    Take one tablet under the tongue every 5 minutes as needed for chest pain   OXYCODONE-ACETAMINOPHEN (PERCOCET) 10-325 MG TABLET    Take 1 tablet by mouth every 6 (six) hours.    PANTOPRAZOLE (PROTONIX) 40 MG TABLET    Take 1 tablet (40 mg total) by mouth daily.   PRAVASTATIN (PRAVACHOL) 20 MG TABLET    TAKE 1 TABLET BY MOUTH EVERY DAY FOR cholesterol   RIVAROXABAN (XARELTO) 10 MG TABS TABLET    Take 1 tablet (10 mg total) by mouth daily.   SODIUM CHLORIDE (OCEAN) 0.65 % SOLN NASAL SPRAY    Place 1 spray into both nostrils 3 (three) times daily as needed (for sinus congestion.). RINSE AS NEEDED FOR SINUS CONGESTION    SPACER/AERO-HOLDING CHAMBERS (AEROCHAMBER PLUS FLO-VU LARGE) MISC    1 each by Other route once.  Modified Medications   No medications on file  Discontinued Medications   No medications on file     Physical Exam:  Vitals:   09/24/17 1551  BP: 128/70  Pulse: 82  Resp: 16  Temp: 98.6 F (37 C)  TempSrc: Oral  SpO2: 92%  Height: 5\' 7"  (1.702 m)   Body mass index is 53.41 kg/m.  Physical Exam  Constitutional: She is oriented to person, place, and time. She appears well-developed and well-nourished.  HENT:  Head: Normocephalic and atraumatic.  Mouth/Throat: Oropharynx is clear and moist. No oropharyngeal exudate.  Eyes: Pupils are equal, round, and reactive to light. No scleral icterus.  Neck: Normal range of motion. Neck supple. Carotid bruit is not present.  Cardiovascular: Normal rate and regular rhythm. Exam reveals no gallop and no friction rub.  Murmur (1/6 SEM) heard. Pulmonary/Chest: Effort normal and breath sounds normal. No stridor. No respiratory distress. She has no wheezes. She has no rales.  Abdominal: Soft. Normal appearance and bowel sounds are normal. She exhibits no distension. There is no hepatomegaly. There is no tenderness. There is no rigidity, no rebound, no guarding and no CVA  tenderness.  obese  Neurological: She is alert and oriented to person, place, and time. She has normal reflexes.  Skin: Skin is warm and dry. No rash noted.  Psychiatric: Her behavior is normal. Thought content normal. Cognition and memory  are impaired. She exhibits a depressed mood.    Labs reviewed: Basic Metabolic Panel: Recent Labs    08/02/17 1346 08/27/17 0018 09/07/17 1305  NA 141 133* 138  K 4.8 5.2* 4.9  CL 105 102 103  CO2 29 22 29   GLUCOSE 137 580* 212*  BUN 27* 32* 27*  CREATININE 1.59* 1.69* 1.41*  CALCIUM 8.8 9.1 9.0   Liver Function Tests: Recent Labs    01/16/17 1516 03/21/17 1351 06/08/17 1015 07/25/17 1815  AST 15 12  --  23  ALT 9 10 9 14   ALKPHOS  --  77  --  83  BILITOT 0.3 0.30  --  0.4  PROT 7.4 7.9  --  8.8*  ALBUMIN  --  2.8*  --  3.1*   No results for input(s): LIPASE, AMYLASE in the last 8760 hours. No results for input(s): AMMONIA in the last 8760 hours. CBC: Recent Labs    07/27/17 0516 07/28/17 0507 08/02/17 1346 08/27/17 0018  WBC 12.4* 11.1* 10.1 11.2*  NEUTROABS 7.8* 6.5 6,929  --   HGB 7.7* 7.9* 8.2* 9.1*  HCT 26.1* 27.0* 25.5* 29.8*  MCV 84.7 86.0 79.9* 83.5  PLT 323 317 304 307   Lipid Panel: Recent Labs    10/13/16 1230 01/16/17 1516 06/08/17 1015  CHOL 99 114 92  HDL 34* 38* 38*  LDLCALC 41 53 37  TRIG 118 149 89  CHOLHDL 2.9 3.0 2.4   TSH: No results for input(s): TSH in the last 8760 hours. A1C: Lab Results  Component Value Date   HGBA1C 6.7 (H) 07/27/2017     Assessment/Plan 1. Dysuria -UA abnormal with large leukocytes and positive nitrates.  -to increase hydration with water -will await culture at this time.  - Culture, Urine; Future - Culture, Urine  Next appt: 12/11/2017 Carlos American. [place], [facility] [phone]

## 2017-09-24 NOTE — Patient Instructions (Signed)
Continue to drink plenty of fluids- water is best.  Can take cranberry tablet daily which is over the counter

## 2017-09-25 ENCOUNTER — Other Ambulatory Visit: Payer: Self-pay | Admitting: Pharmacist

## 2017-09-25 NOTE — Patient Outreach (Signed)
[facility]) Care Management  09/25/2017  [name] 08/29/1934 [id number]   83 y.o. year old female referred to [name] for Medication Assistance  Patient's daughter [name] (DPR on file) confirms identity with HIPAA-identifiers x2 and gives verbal consent to speak over the phone about medications.   Calling to review medication assistance options and to schedule a home visit per recommendation from Dr. Eulas [name]. [name] states that they have had a death in the family and she is feeling overwhelmed. She did receive medication assistance application from [name], [name].   Plan:  [name] asks that [name] follow ups be postponed to next week. She is inclined to agree to a home visit. Will alert [name] to let her know the patient's family has experienced a loss and requests follow up next week.   [name], PharmD Clinical Pharmacist, [facility] [phone]

## 2017-09-26 ENCOUNTER — Other Ambulatory Visit: Payer: Self-pay | Admitting: Nurse Practitioner

## 2017-09-26 DIAGNOSIS — N183 Chronic kidney disease, stage 3 (moderate): Secondary | ICD-10-CM | POA: Diagnosis not present

## 2017-09-26 DIAGNOSIS — I130 Hypertensive heart and chronic kidney disease with heart failure and stage 1 through stage 4 chronic kidney disease, or unspecified chronic kidney disease: Secondary | ICD-10-CM | POA: Diagnosis not present

## 2017-09-26 DIAGNOSIS — C50919 Malignant neoplasm of unspecified site of unspecified female breast: Secondary | ICD-10-CM | POA: Diagnosis not present

## 2017-09-26 DIAGNOSIS — M109 Gout, unspecified: Secondary | ICD-10-CM | POA: Diagnosis not present

## 2017-09-26 DIAGNOSIS — F329 Major depressive disorder, single episode, unspecified: Secondary | ICD-10-CM | POA: Diagnosis not present

## 2017-09-26 DIAGNOSIS — J449 Chronic obstructive pulmonary disease, unspecified: Secondary | ICD-10-CM | POA: Diagnosis not present

## 2017-09-26 DIAGNOSIS — M353 Polymyalgia rheumatica: Secondary | ICD-10-CM | POA: Diagnosis not present

## 2017-09-26 DIAGNOSIS — F0390 Unspecified dementia without behavioral disturbance: Secondary | ICD-10-CM | POA: Diagnosis not present

## 2017-09-26 DIAGNOSIS — M1991 Primary osteoarthritis, unspecified site: Secondary | ICD-10-CM | POA: Diagnosis not present

## 2017-09-26 DIAGNOSIS — I2510 Atherosclerotic heart disease of native coronary artery without angina pectoris: Secondary | ICD-10-CM | POA: Diagnosis not present

## 2017-09-26 DIAGNOSIS — I5032 Chronic diastolic (congestive) heart failure: Secondary | ICD-10-CM | POA: Diagnosis not present

## 2017-09-26 DIAGNOSIS — E1122 Type 2 diabetes mellitus with diabetic chronic kidney disease: Secondary | ICD-10-CM | POA: Diagnosis not present

## 2017-09-26 DIAGNOSIS — I252 Old myocardial infarction: Secondary | ICD-10-CM | POA: Diagnosis not present

## 2017-09-26 LAB — URINE CULTURE
MICRO NUMBER:: 90615547
SPECIMEN QUALITY:: ADEQUATE

## 2017-09-26 MED ORDER — CEPHALEXIN 500 MG PO CAPS: 500.0000 mg | ORAL_CAPSULE | Freq: Two times a day (BID) | ORAL | 0 refills | Status: DC

## 2017-10-02 ENCOUNTER — Other Ambulatory Visit: Payer: Self-pay | Admitting: Pharmacist

## 2017-10-02 ENCOUNTER — Ambulatory Visit: Payer: Self-pay | Admitting: Pharmacist

## 2017-10-02 NOTE — Patient Outreach (Signed)
[place] [facility]) Care Management  10/02/2017  [name] 1935/04/14 [id number]  83 y.o. year old female referred to [facility] for Medication Assistance  Was attempting to contact patient's daughter, [name], to schedule a home visit (requested by Dr. Eulas Post).   Was unable to reach patient's daughter, [name] [facility] on file) via telephone today and unfortunately voicemail box was full.   Plan: Patient has previously been sent a unsuccessful outreach letter on 09/18/17 and I have attempted to schedule a home visit 3 times to date. I am unable to close the pharmacy episode per protocol as patient is still working with Etter Sjogren, [facility] CPhT, for patient assistance. Patient will be treated as a MAP patient from this point forward and no pharmacist follow up will be scheduled. Patient/family is welcome to contact me for a home visit at their convenience.   Ruben Reason, PharmD Clinical Pharmacist, [facility] [phone]

## 2017-10-03 DIAGNOSIS — M21961 Unspecified acquired deformity of right lower leg: Secondary | ICD-10-CM | POA: Diagnosis not present

## 2017-10-03 DIAGNOSIS — L602 Onychogryphosis: Secondary | ICD-10-CM | POA: Diagnosis not present

## 2017-10-03 DIAGNOSIS — M21962 Unspecified acquired deformity of left lower leg: Secondary | ICD-10-CM | POA: Diagnosis not present

## 2017-10-03 DIAGNOSIS — E1351 Other specified diabetes mellitus with diabetic peripheral angiopathy without gangrene: Secondary | ICD-10-CM | POA: Diagnosis not present

## 2017-10-06 DIAGNOSIS — G8929 Other chronic pain: Secondary | ICD-10-CM | POA: Diagnosis not present

## 2017-10-06 DIAGNOSIS — M1990 Unspecified osteoarthritis, unspecified site: Secondary | ICD-10-CM | POA: Diagnosis not present

## 2017-10-06 DIAGNOSIS — I5040 Unspecified combined systolic (congestive) and diastolic (congestive) heart failure: Secondary | ICD-10-CM | POA: Diagnosis not present

## 2017-10-06 DIAGNOSIS — D649 Anemia, unspecified: Secondary | ICD-10-CM | POA: Diagnosis not present

## 2017-10-06 DIAGNOSIS — E875 Hyperkalemia: Secondary | ICD-10-CM | POA: Diagnosis not present

## 2017-10-06 DIAGNOSIS — R0689 Other abnormalities of breathing: Secondary | ICD-10-CM | POA: Diagnosis not present

## 2017-10-06 DIAGNOSIS — E669 Obesity, unspecified: Secondary | ICD-10-CM | POA: Diagnosis not present

## 2017-10-08 ENCOUNTER — Ambulatory Visit (INDEPENDENT_AMBULATORY_CARE_PROVIDER_SITE_OTHER)
Admission: EM | Admit: 2017-10-08 | Discharge: 2017-10-08 | Disposition: A | Discharge status: Other Acute Inpatient Hospital | Payer: PPO | Source: Home / Self Care

## 2017-10-08 ENCOUNTER — Other Ambulatory Visit: Payer: Self-pay

## 2017-10-08 ENCOUNTER — Other Ambulatory Visit: Payer: Self-pay | Admitting: Pharmacy Technician

## 2017-10-08 ENCOUNTER — Inpatient Hospital Stay (HOSPITAL_COMMUNITY)
Admission: EM | Admit: 2017-10-08 | Discharge: 2017-10-10 | DRG: 291 | Disposition: A | Discharge status: Home Health | Payer: PPO | Attending: Internal Medicine | Admitting: Internal Medicine

## 2017-10-08 ENCOUNTER — Encounter (HOSPITAL_COMMUNITY): Payer: Self-pay | Admitting: Emergency Medicine

## 2017-10-08 ENCOUNTER — Emergency Department (HOSPITAL_COMMUNITY): Payer: PPO

## 2017-10-08 DIAGNOSIS — E1121 Type 2 diabetes mellitus with diabetic nephropathy: Secondary | ICD-10-CM | POA: Diagnosis not present

## 2017-10-08 DIAGNOSIS — Z6841 Body Mass Index (BMI) 40.0 and over, adult: Secondary | ICD-10-CM

## 2017-10-08 DIAGNOSIS — M353 Polymyalgia rheumatica: Secondary | ICD-10-CM | POA: Diagnosis not present

## 2017-10-08 DIAGNOSIS — Z86711 Personal history of pulmonary embolism: Secondary | ICD-10-CM

## 2017-10-08 DIAGNOSIS — K2100 Gastro-esophageal reflux disease with esophagitis, without bleeding: Secondary | ICD-10-CM | POA: Diagnosis present

## 2017-10-08 DIAGNOSIS — R627 Adult failure to thrive: Secondary | ICD-10-CM | POA: Diagnosis present

## 2017-10-08 DIAGNOSIS — I10 Essential (primary) hypertension: Secondary | ICD-10-CM | POA: Diagnosis present

## 2017-10-08 DIAGNOSIS — E1142 Type 2 diabetes mellitus with diabetic polyneuropathy: Secondary | ICD-10-CM | POA: Diagnosis present

## 2017-10-08 DIAGNOSIS — G894 Chronic pain syndrome: Secondary | ICD-10-CM | POA: Diagnosis present

## 2017-10-08 DIAGNOSIS — Z794 Long term (current) use of insulin: Secondary | ICD-10-CM | POA: Diagnosis not present

## 2017-10-08 DIAGNOSIS — Z90710 Acquired absence of both cervix and uterus: Secondary | ICD-10-CM | POA: Diagnosis not present

## 2017-10-08 DIAGNOSIS — N183 Chronic kidney disease, stage 3 unspecified: Secondary | ICD-10-CM | POA: Diagnosis present

## 2017-10-08 DIAGNOSIS — E1165 Type 2 diabetes mellitus with hyperglycemia: Secondary | ICD-10-CM | POA: Diagnosis not present

## 2017-10-08 DIAGNOSIS — Z66 Do not resuscitate: Secondary | ICD-10-CM | POA: Diagnosis not present

## 2017-10-08 DIAGNOSIS — I509 Heart failure, unspecified: Secondary | ICD-10-CM

## 2017-10-08 DIAGNOSIS — R0602 Shortness of breath: Secondary | ICD-10-CM

## 2017-10-08 DIAGNOSIS — E559 Vitamin D deficiency, unspecified: Secondary | ICD-10-CM | POA: Diagnosis not present

## 2017-10-08 DIAGNOSIS — Z993 Dependence on wheelchair: Secondary | ICD-10-CM

## 2017-10-08 DIAGNOSIS — J449 Chronic obstructive pulmonary disease, unspecified: Secondary | ICD-10-CM | POA: Diagnosis not present

## 2017-10-08 DIAGNOSIS — N1832 Chronic kidney disease, stage 3b: Secondary | ICD-10-CM

## 2017-10-08 DIAGNOSIS — Z8249 Family history of ischemic heart disease and other diseases of the circulatory system: Secondary | ICD-10-CM | POA: Diagnosis not present

## 2017-10-08 DIAGNOSIS — E119 Type 2 diabetes mellitus without complications: Secondary | ICD-10-CM | POA: Diagnosis not present

## 2017-10-08 DIAGNOSIS — R52 Pain, unspecified: Secondary | ICD-10-CM | POA: Diagnosis not present

## 2017-10-08 DIAGNOSIS — I2510 Atherosclerotic heart disease of native coronary artery without angina pectoris: Secondary | ICD-10-CM | POA: Diagnosis present

## 2017-10-08 DIAGNOSIS — Z170 Estrogen receptor positive status [ER+]: Secondary | ICD-10-CM | POA: Diagnosis not present

## 2017-10-08 DIAGNOSIS — Z833 Family history of diabetes mellitus: Secondary | ICD-10-CM | POA: Diagnosis not present

## 2017-10-08 DIAGNOSIS — I252 Old myocardial infarction: Secondary | ICD-10-CM

## 2017-10-08 DIAGNOSIS — E1169 Type 2 diabetes mellitus with other specified complication: Secondary | ICD-10-CM | POA: Diagnosis present

## 2017-10-08 DIAGNOSIS — F0390 Unspecified dementia without behavioral disturbance: Secondary | ICD-10-CM | POA: Diagnosis not present

## 2017-10-08 DIAGNOSIS — M1990 Unspecified osteoarthritis, unspecified site: Secondary | ICD-10-CM | POA: Diagnosis not present

## 2017-10-08 DIAGNOSIS — Z86718 Personal history of other venous thrombosis and embolism: Secondary | ICD-10-CM | POA: Diagnosis not present

## 2017-10-08 DIAGNOSIS — D649 Anemia, unspecified: Secondary | ICD-10-CM | POA: Diagnosis not present

## 2017-10-08 DIAGNOSIS — E785 Hyperlipidemia, unspecified: Secondary | ICD-10-CM | POA: Diagnosis present

## 2017-10-08 DIAGNOSIS — I2583 Coronary atherosclerosis due to lipid rich plaque: Secondary | ICD-10-CM | POA: Diagnosis not present

## 2017-10-08 DIAGNOSIS — Z9884 Bariatric surgery status: Secondary | ICD-10-CM

## 2017-10-08 DIAGNOSIS — C50112 Malignant neoplasm of central portion of left female breast: Secondary | ICD-10-CM | POA: Diagnosis not present

## 2017-10-08 DIAGNOSIS — Z955 Presence of coronary angioplasty implant and graft: Secondary | ICD-10-CM

## 2017-10-08 DIAGNOSIS — N189 Chronic kidney disease, unspecified: Secondary | ICD-10-CM | POA: Diagnosis not present

## 2017-10-08 DIAGNOSIS — D509 Iron deficiency anemia, unspecified: Secondary | ICD-10-CM | POA: Diagnosis present

## 2017-10-08 DIAGNOSIS — I110 Hypertensive heart disease with heart failure: Secondary | ICD-10-CM | POA: Diagnosis not present

## 2017-10-08 DIAGNOSIS — N1831 Chronic kidney disease, stage 3a: Secondary | ICD-10-CM | POA: Diagnosis present

## 2017-10-08 DIAGNOSIS — Z79811 Long term (current) use of aromatase inhibitors: Secondary | ICD-10-CM | POA: Diagnosis not present

## 2017-10-08 DIAGNOSIS — I5033 Acute on chronic diastolic (congestive) heart failure: Secondary | ICD-10-CM | POA: Diagnosis not present

## 2017-10-08 DIAGNOSIS — N179 Acute kidney failure, unspecified: Secondary | ICD-10-CM | POA: Diagnosis present

## 2017-10-08 DIAGNOSIS — I130 Hypertensive heart and chronic kidney disease with heart failure and stage 1 through stage 4 chronic kidney disease, or unspecified chronic kidney disease: Secondary | ICD-10-CM | POA: Diagnosis not present

## 2017-10-08 DIAGNOSIS — D638 Anemia in other chronic diseases classified elsewhere: Secondary | ICD-10-CM | POA: Diagnosis present

## 2017-10-08 DIAGNOSIS — E1122 Type 2 diabetes mellitus with diabetic chronic kidney disease: Secondary | ICD-10-CM | POA: Diagnosis present

## 2017-10-08 DIAGNOSIS — R0789 Other chest pain: Secondary | ICD-10-CM

## 2017-10-08 DIAGNOSIS — I25118 Atherosclerotic heart disease of native coronary artery with other forms of angina pectoris: Secondary | ICD-10-CM | POA: Diagnosis present

## 2017-10-08 DIAGNOSIS — Z7901 Long term (current) use of anticoagulants: Secondary | ICD-10-CM

## 2017-10-08 DIAGNOSIS — J410 Simple chronic bronchitis: Secondary | ICD-10-CM

## 2017-10-08 DIAGNOSIS — J4489 Other specified chronic obstructive pulmonary disease: Secondary | ICD-10-CM | POA: Diagnosis present

## 2017-10-08 DIAGNOSIS — R937 Abnormal findings on diagnostic imaging of other parts of musculoskeletal system: Secondary | ICD-10-CM | POA: Diagnosis not present

## 2017-10-08 DIAGNOSIS — D72829 Elevated white blood cell count, unspecified: Secondary | ICD-10-CM | POA: Diagnosis present

## 2017-10-08 DIAGNOSIS — C50919 Malignant neoplasm of unspecified site of unspecified female breast: Secondary | ICD-10-CM | POA: Diagnosis present

## 2017-10-08 DIAGNOSIS — C50912 Malignant neoplasm of unspecified site of left female breast: Secondary | ICD-10-CM | POA: Diagnosis not present

## 2017-10-08 DIAGNOSIS — Z853 Personal history of malignant neoplasm of breast: Secondary | ICD-10-CM

## 2017-10-08 LAB — CBC
HCT: 28 % — ABNORMAL LOW (ref 36.0–46.0)
Hemoglobin: 8.3 g/dL — ABNORMAL LOW (ref 12.0–15.0)
MCH: 24.6 pg — ABNORMAL LOW (ref 26.0–34.0)
MCHC: 29.6 g/dL — ABNORMAL LOW (ref 30.0–36.0)
MCV: 82.8 fL (ref 78.0–100.0)
Platelets: 331 10*3/uL (ref 150–400)
RBC: 3.38 MIL/uL — ABNORMAL LOW (ref 3.87–5.11)
RDW: 14.5 % (ref 11.5–15.5)
WBC: 11 10*3/uL — ABNORMAL HIGH (ref 4.0–10.5)

## 2017-10-08 LAB — TROPONIN I: Troponin I: <0.03 ng/mL (ref <0.03)

## 2017-10-08 LAB — BASIC METABOLIC PANEL
Anion gap: 7 (ref 5–15)
BUN: 22 mg/dL — ABNORMAL HIGH (ref 6–20)
CO2: 28 mmol/L (ref 22–32)
Calcium: 8.9 mg/dL (ref 8.9–10.3)
Chloride: 103 mmol/L (ref 101–111)
Creatinine, Ser: 1.26 mg/dL — ABNORMAL HIGH (ref 0.44–1.00)
GFR calc Af Amer: 45 mL/min — ABNORMAL LOW (ref >60)
GFR calc non Af Amer: 39 mL/min — ABNORMAL LOW (ref >60)
Glucose, Bld: 230 mg/dL — ABNORMAL HIGH (ref 65–99)
Potassium: 4.4 mmol/L (ref 3.5–5.1)
Sodium: 138 mmol/L (ref 135–145)

## 2017-10-08 LAB — BRAIN NATRIURETIC PEPTIDE: B Natriuretic Peptide: 104.4 pg/mL — ABNORMAL HIGH (ref 0.0–100.0)

## 2017-10-08 LAB — I-STAT TROPONIN, ED: Troponin i, poc: 0.02 ng/mL (ref 0.00–0.08)

## 2017-10-08 MED ORDER — ONDANSETRON HCL 4 MG PO TABS: 4.0000 mg | ORAL_TABLET | Freq: Four times a day (QID) | ORAL | Status: DC | PRN

## 2017-10-08 MED ORDER — HYDRALAZINE HCL 10 MG PO TABS
10.0000 mg | ORAL_TABLET | Freq: Once | ORAL | Status: AC
  Administered 2017-10-08: 10 mg via ORAL
  Filled 2017-10-08: qty 1

## 2017-10-08 MED ORDER — OXYCODONE HCL 5 MG PO TABS
5.0000 mg | ORAL_TABLET | Freq: Four times a day (QID) | ORAL | Status: DC | PRN
  Administered 2017-10-09: 5 mg via ORAL
  Filled 2017-10-08: qty 1

## 2017-10-08 MED ORDER — HYDRALAZINE HCL 10 MG PO TABS
10.0000 mg | ORAL_TABLET | Freq: Three times a day (TID) | ORAL | Status: DC
  Administered 2017-10-09 – 2017-10-10 (×5): 10 mg via ORAL
  Filled 2017-10-08 (×7): qty 1

## 2017-10-08 MED ORDER — ISOSORBIDE MONONITRATE ER 60 MG PO TB24
60.0000 mg | ORAL_TABLET | Freq: Every day | ORAL | Status: DC
  Administered 2017-10-09 – 2017-10-10 (×2): 60 mg via ORAL
  Filled 2017-10-08 (×2): qty 1

## 2017-10-08 MED ORDER — ANASTROZOLE 1 MG PO TABS
1.0000 mg | ORAL_TABLET | Freq: Every day | ORAL | Status: DC
  Administered 2017-10-09 – 2017-10-10 (×2): 1 mg via ORAL
  Filled 2017-10-08 (×2): qty 1

## 2017-10-08 MED ORDER — MEMANTINE HCL 5 MG PO TABS
10.0000 mg | ORAL_TABLET | Freq: Every day | ORAL | Status: DC
  Administered 2017-10-09 – 2017-10-10 (×2): 10 mg via ORAL
  Filled 2017-10-08 (×2): qty 2

## 2017-10-08 MED ORDER — IPRATROPIUM BROMIDE 0.02 % IN SOLN: 0.5000 mg | Freq: Four times a day (QID) | RESPIRATORY_TRACT | Status: DC

## 2017-10-08 MED ORDER — INSULIN ASPART 100 UNIT/ML ~~LOC~~ SOLN
0.0000 [IU] | Freq: Every day | SUBCUTANEOUS | Status: DC
  Administered 2017-10-09: 3 [IU] via SUBCUTANEOUS
  Administered 2017-10-09: 2 [IU] via SUBCUTANEOUS

## 2017-10-08 MED ORDER — ACETAMINOPHEN 325 MG PO TABS
650.0000 mg | ORAL_TABLET | Freq: Four times a day (QID) | ORAL | Status: DC | PRN
  Administered 2017-10-09: 650 mg via ORAL
  Filled 2017-10-08: qty 2

## 2017-10-08 MED ORDER — PRAVASTATIN SODIUM 20 MG PO TABS
20.0000 mg | ORAL_TABLET | Freq: Every day | ORAL | Status: DC
  Administered 2017-10-09: 20 mg via ORAL
  Filled 2017-10-08: qty 1

## 2017-10-08 MED ORDER — MONTELUKAST SODIUM 10 MG PO TABS
10.0000 mg | ORAL_TABLET | Freq: Every day | ORAL | Status: DC
  Administered 2017-10-09 (×2): 10 mg via ORAL
  Filled 2017-10-08 (×3): qty 1

## 2017-10-08 MED ORDER — RIVAROXABAN 10 MG PO TABS
10.0000 mg | ORAL_TABLET | Freq: Every day | ORAL | Status: DC
  Administered 2017-10-09: 10 mg via ORAL
  Filled 2017-10-08: qty 1

## 2017-10-08 MED ORDER — ALBUTEROL SULFATE (2.5 MG/3ML) 0.083% IN NEBU: 2.5000 mg | INHALATION_SOLUTION | RESPIRATORY_TRACT | Status: DC | PRN

## 2017-10-08 MED ORDER — DULOXETINE HCL 60 MG PO CPEP
60.0000 mg | ORAL_CAPSULE | Freq: Every day | ORAL | Status: DC
  Administered 2017-10-09 – 2017-10-10 (×2): 60 mg via ORAL
  Filled 2017-10-08 (×2): qty 1

## 2017-10-08 MED ORDER — IPRATROPIUM-ALBUTEROL 0.5-2.5 (3) MG/3ML IN SOLN
3.0000 mL | Freq: Once | RESPIRATORY_TRACT | Status: AC
  Administered 2017-10-08: 3 mL via RESPIRATORY_TRACT
  Filled 2017-10-08: qty 3

## 2017-10-08 MED ORDER — OXYCODONE-ACETAMINOPHEN 5-325 MG PO TABS
1.0000 | ORAL_TABLET | Freq: Four times a day (QID) | ORAL | Status: DC | PRN
  Administered 2017-10-08: 1 via ORAL
  Filled 2017-10-08: qty 1

## 2017-10-08 MED ORDER — CLONAZEPAM 0.5 MG PO TABS
1.0000 mg | ORAL_TABLET | Freq: Every day | ORAL | Status: DC
  Administered 2017-10-09 (×2): 1 mg via ORAL
  Filled 2017-10-08 (×2): qty 2

## 2017-10-08 MED ORDER — ONDANSETRON HCL 4 MG/2ML IJ SOLN: 4.0000 mg | Freq: Four times a day (QID) | INTRAMUSCULAR | Status: DC | PRN

## 2017-10-08 MED ORDER — SODIUM CHLORIDE 0.9% FLUSH
3.0000 mL | Freq: Two times a day (BID) | INTRAVENOUS | Status: DC
  Administered 2017-10-09 – 2017-10-10 (×3): 3 mL via INTRAVENOUS

## 2017-10-08 MED ORDER — SODIUM CHLORIDE 0.9 % IV SOLN: 250.0000 mL | INTRAVENOUS | Status: DC | PRN

## 2017-10-08 MED ORDER — FUROSEMIDE 10 MG/ML IJ SOLN
40.0000 mg | Freq: Once | INTRAMUSCULAR | Status: AC
  Administered 2017-10-08: 40 mg via INTRAVENOUS
  Filled 2017-10-08 (×2): qty 4

## 2017-10-08 MED ORDER — PANTOPRAZOLE SODIUM 40 MG PO TBEC
40.0000 mg | DELAYED_RELEASE_TABLET | Freq: Every day | ORAL | Status: DC
  Administered 2017-10-09 – 2017-10-10 (×2): 40 mg via ORAL
  Filled 2017-10-08 (×2): qty 1

## 2017-10-08 MED ORDER — GABAPENTIN 100 MG PO CAPS
100.0000 mg | ORAL_CAPSULE | Freq: Every day | ORAL | Status: DC
  Administered 2017-10-09 (×2): 100 mg via ORAL
  Filled 2017-10-08 (×2): qty 1

## 2017-10-08 MED ORDER — OXYCODONE-ACETAMINOPHEN 10-325 MG PO TABS: 1.0000 | ORAL_TABLET | Freq: Four times a day (QID) | ORAL | Status: DC

## 2017-10-08 MED ORDER — LORATADINE 10 MG PO TABS
10.0000 mg | ORAL_TABLET | Freq: Every day | ORAL | Status: DC
  Administered 2017-10-09 – 2017-10-10 (×2): 10 mg via ORAL
  Filled 2017-10-08 (×2): qty 1

## 2017-10-08 MED ORDER — ACETAMINOPHEN 650 MG RE SUPP: 650.0000 mg | Freq: Four times a day (QID) | RECTAL | Status: DC | PRN

## 2017-10-08 MED ORDER — FUROSEMIDE 10 MG/ML IJ SOLN
40.0000 mg | Freq: Two times a day (BID) | INTRAMUSCULAR | Status: DC
  Administered 2017-10-09: 40 mg via INTRAVENOUS
  Filled 2017-10-08: qty 4

## 2017-10-08 MED ORDER — FUROSEMIDE 20 MG PO TABS: 20.0000 mg | ORAL_TABLET | Freq: Every day | ORAL | 0 refills | Status: DC

## 2017-10-08 MED ORDER — BUDESONIDE-FORMOTEROL FUMARATE 160-4.5 MCG/ACT IN AERO: 2.0000 | INHALATION_SPRAY | Freq: Two times a day (BID) | RESPIRATORY_TRACT | 6 refills | Status: DC

## 2017-10-08 MED ORDER — METOPROLOL SUCCINATE ER 25 MG PO TB24
25.0000 mg | ORAL_TABLET | Freq: Every day | ORAL | Status: DC
  Administered 2017-10-09 – 2017-10-10 (×2): 25 mg via ORAL
  Filled 2017-10-08 (×2): qty 1

## 2017-10-08 MED ORDER — MEMANTINE HCL 5 MG PO TABS
5.0000 mg | ORAL_TABLET | Freq: Every day | ORAL | Status: DC
  Administered 2017-10-09 (×2): 5 mg via ORAL
  Filled 2017-10-08 (×3): qty 1

## 2017-10-08 MED ORDER — SODIUM CHLORIDE 0.9% FLUSH: 3.0000 mL | INTRAVENOUS | Status: DC | PRN

## 2017-10-08 MED ORDER — INSULIN GLARGINE 100 UNIT/ML ~~LOC~~ SOLN
35.0000 [IU] | Freq: Two times a day (BID) | SUBCUTANEOUS | Status: DC
  Administered 2017-10-09 – 2017-10-10 (×4): 35 [IU] via SUBCUTANEOUS
  Filled 2017-10-08 (×5): qty 0.35

## 2017-10-08 MED ORDER — INSULIN ASPART 100 UNIT/ML ~~LOC~~ SOLN
0.0000 [IU] | Freq: Three times a day (TID) | SUBCUTANEOUS | Status: DC
  Administered 2017-10-09: 7 [IU] via SUBCUTANEOUS
  Administered 2017-10-09: 2 [IU] via SUBCUTANEOUS
  Administered 2017-10-09: 5 [IU] via SUBCUTANEOUS
  Administered 2017-10-10: 3 [IU] via SUBCUTANEOUS
  Administered 2017-10-10: 5 [IU] via SUBCUTANEOUS

## 2017-10-08 NOTE — ED Notes (Signed)
Pt sob moving from chair to bed. Pt not able to ambulate.

## 2017-10-08 NOTE — ED Provider Notes (Signed)
Assume care from [name], [place]. H/o CHF, COPD who presents with dyspnea x 2-3 days. Worsening LE edema for months as well as orthopnea. Not on diuretics. On exam, wheezing, some rales. Given dose of lasix here. Plan will be for f/u trop, if negative, can be d/c'd home with beta agonists and started on lasix.   Repeat troponin neg. On reexam, pt still with dyspnea, states she is unable to walk back and forth to the bathroom secondary to dyspnea. Admitted to Dr.Doutova for CHF exacerbation.    Nichole Salt, MD 10/08/17 2328    Nichole Kras Wenda Over[place], MD 10/09/17 [phone]

## 2017-10-08 NOTE — ED Provider Notes (Signed)
[facility] EMERGENCY DEPARTMENT Provider Note   CSN: [id number] Arrival date & time: 10/08/17  1236     History   Chief Complaint Chief Complaint  Patient presents with  . Shortness of Breath  . Congestive Heart Failure    HPI [name] is a 83 y.o. female past medical history of anemia, CHF, CAD, depression, diabetes, who presents for evaluation of shortness of breath, worsening bilateral lower extremity edema that is been ongoing for last few months.  Patient reports that she has noticed some progressive worsening lower extremity edema over the last few months.  She denies any overlying erythema or warmth.  Patient also reports that over the last several days, she has had some difficulty breathing.  She reports that it worsened this morning.  Patient reports that today she had difficulty catching her breath.  Patient states that her shortness of breath is worse with exertion.  Although she does not walk around much.  Patient denies any chest pain.  Patient reports that she has had some cough and states that it is productive of dark phlegm.  No hemoptysis noted.  Patient reports that she sleeps propped up on some pillows which is not new for her.  Patient states that she has a history of heart failure but does not know of any fluid pill that she is on.  She did recently travel back from [place] was about 4 hours.  Patient states she is on Xarelto for her stents.  Patient reports a history of MI.  With his stents placed a few years ago.  Patient also has a history of COPD.  She has been using her inhalers but states that she did not try them for today's symptoms.  Patient reports that she is supposed to be on iron supplements but states she has not taken them in the last several weeks.  She states her stools are always dark when she takes iron pills but denies any blood noted.  Patient denies any fevers, vision changes, abdominal pain, nausea/vomiting.  The history  is provided by the patient.    Past Medical History:  Diagnosis Date  . Allergy    takes Mucinex daily as needed  . Anemia, unspecified   . Anxiety    takes Clonazepam daily as needed  . Arthritis   . CHF (congestive heart failure) (HCC)    takes Furosemide daily  . Coronary artery disease    a. s/p IMI 2004 tx with BMS to RCA;  b. s/p Promus DES to RCA 2/12 (cath: LM 20-30%, pLAD 20-30%, mLAD 50%, RI 30%, pRCA 60%, mRCA 90% - tx with PCI);  c. myoview 1/07: EF 49%, inf scar, no isch  . Coronary atherosclerosis of native coronary artery   . Depression    takes Cymbalta daily  . Diabetes mellitus    insulin daily  . Dysuria   . GERD (gastroesophageal reflux disease)    takes Protonix daily  . Gout, unspecified    takes Colchicine daily  . Headache    occasionally  . History of blood clots about 84yrs ago   in legs-takes Coumadin   . Hyperlipidemia    takes Pravastatin daily  . Hypertension    takes Imdur and Metoprolol daily  . Insomnia   . Joint pain   . Joint swelling   . Myocardial infarction ([place]) 2004  . Numbness   . Obstructive sleep apnea   . Osteoarthritis   . Osteoarthritis   .  Osteoarthrosis, unspecified whether generalized or localized, lower leg   . Pain, chronic   . Polymyalgia rheumatica ([place])   . Pulmonary emboli ([place]) 9/13   felt to need lifelong anticoagulation  . Shortness of breath dyspnea    with exertion and has Albuterol inhaler prn  . Type II or unspecified type diabetes mellitus without mention of complication, uncontrolled   . Urinary incontinence    takes Linzess daily  . Vertigo    hx of;was taking Meclizine if needed    Patient Active Problem List   Diagnosis Date Noted  . Hyperkalemia 07/26/2017  . Anxiety associated with depression 06/08/2017  . GERD with esophagitis 11/15/2016  . Memory loss 11/15/2016  . Multiple benign nevi 11/15/2016  . Esophageal dysphagia   . Severe episode of recurrent major depressive disorder, without  psychotic features ([place]) 06/23/2016  . Endometrial polyp 06/23/2016  . Vaginitis and vulvovaginitis 06/23/2016  . Cough variant asthma 11/17/2015  . Vitamin D deficiency 08/02/2015  . Breast cancer ([place]) 04/26/2015  . Dyslipidemia associated with type 2 diabetes mellitus ([place]) 02/06/2015  . Uncontrolled type 2 diabetes mellitus with diabetic nephropathy, with long-term current use of insulin ([place]) 02/06/2015  . Chronic pain syndrome 02/06/2015  . Morbid obesity with BMI of 50.0-59.9, adult ([place]) 12/18/2014  . CKD (chronic kidney disease) stage 3, GFR 30-59 ml/min (HCC) 12/18/2014  . Gout 10/27/2014  . Breast mass 10/27/2014  . Encounter for therapeutic drug monitoring 10/08/2014  . Vertigo 09/24/2014  . Chronic bronchitis ([place]) 09/23/2014  . Preoperative clearance 08/16/2014  . Abnormal stress test   . DOE (dyspnea on exertion) 06/24/2014  . CN (constipation) 05/10/2014  . History of pulmonary embolus (PE) 04/22/2014  . Sepsis secondary to UTI ([place]) 04/21/2014  . COPD (chronic obstructive pulmonary disease) ([place]) 04/21/2014  . Estrogen deficiency 02/19/2014  . Breast cancer screening 02/19/2014  . Weight gain 02/19/2014  . Pain in the chest 12/31/2013  . Type 2 diabetes mellitus with diabetic foot deformity ([place]) 07/29/2013  . OSA (obstructive sleep apnea) 02/24/2013  . Need for prophylactic vaccination and inoculation against influenza 01/08/2013  . Insomnia 09/17/2012  . Debility 08/01/2012  . Osteoarthritis   . Dysphagia, unspecified(787.20) 07/31/2012  . GERD (gastroesophageal reflux disease) 07/31/2012  . Long term current use of anticoagulant therapy 02/02/2012  . Chronic diastolic congestive heart failure ([place]) 01/29/2012  . History of deep venous thrombosis 01/27/2012  . Abdominal pain 01/26/2012  . CHOLELITHIASIS 06/06/2010  . Situational depression 06/04/2009  . ORTHOPNEA 03/09/2009  . Morbid (severe) obesity due to excess calories ([place]) 02/15/2009  . ANEMIA  02/15/2009  . POLYMYALGIA RHEUMATICA 01/18/2009  . TENSION HEADACHE 01/07/2008  . Headache 01/07/2008  . FUNGAL DERMATITIS 11/13/2007  . CAD (coronary artery disease) 08/29/2007  . URINARY INCONTINENCE 08/29/2007  . Hyperlipidemia 12/03/2006  . Essential hypertension 12/03/2006  . DIVERTICULITIS, HX OF 12/03/2006    Past Surgical History:  Procedure Laterality Date  . ABDOMINAL HYSTERECTOMY     partial  . APPENDECTOMY    . blood clots/legs and lungs  2013  . BREAST BIOPSY Left 07/22/2014  . BREAST BIOPSY Left 02/10/2013  . BREAST LUMPECTOMY Left 11/05/2014  . BREAST LUMPECTOMY WITH RADIOACTIVE SEED LOCALIZATION Left 11/05/2014   Procedure: LEFT BREAST LUMPECTOMY WITH RADIOACTIVE SEED LOCALIZATION;  Surgeon: [name], MD;  Location: [facility];  Service: General;  Laterality: Left;  . CARDIAC CATHETERIZATION    . COLONOSCOPY    . CORONARY ANGIOPLASTY  2  . ESOPHAGOGASTRODUODENOSCOPY (EGD) WITH  PROPOFOL N/A 11/07/2016   Procedure: ESOPHAGOGASTRODUODENOSCOPY (EGD) WITH PROPOFOL;  Surgeon: Gatha Mayer, MD;  Location: [facility];  Service: Endoscopy;  Laterality: N/A;  . EXCISION OF SKIN TAG Right 11/05/2014   Procedure: EXCISION OF RIGHT EYELID SKIN TAG;  Surgeon: [name], MD;  Location: [facility];  Service: General;  Laterality: Right;  . EYE SURGERY Bilateral    cataract   . GASTRIC BYPASS  1977    reversed in 1979, [place] N/A 06/29/2014   Procedure: LEFT HEART CATHETERIZATION WITH CORONARY ANGIOGRAM;  Surgeon: Troy Sine, MD;  Location: [facility] CATH LAB;  Service: Cardiovascular;  Laterality: N/A;  . MI with stent placement  2004     OB History   None      Home Medications    Prior to Admission medications   Medication Sig Start Date End Date Taking? Authorizing Provider  albuterol (PROVENTIL HFA;VENTOLIN HFA) 108 (90 Base) MCG/ACT inhaler Inhale 1-2 puffs into the lungs every 6 (six) hours as needed  for wheezing or shortness of breath. 03/15/16  Yes Robyn Haber, MD  anastrozole (ARIMIDEX) 1 MG tablet Take 1 tablet (1 mg total) by mouth daily. 09/20/16  Yes [name], MD  Biotin 10000 MCG TABS Take 1 tablet by mouth daily.   Yes [provider]  cetirizine (ZYRTEC) 10 MG tablet Take 10 mg by mouth at bedtime as needed for allergies.    Yes [provider]  Cholecalciferol (VITAMIN D3) 2000 units TABS Take 2 tablets by mouth.    Yes [provider]  clonazePAM (KLONOPIN) 1 MG tablet TAKE 2 TABLETS BY MOUTH AT BEDTIME FOR ANXIETY 09/19/17  Yes Gildardo Cranker, DO  docusate sodium (COLACE) 100 MG capsule Take 100 mg by mouth daily as needed for mild constipation.   Yes [provider]  DULoxetine (CYMBALTA) 60 MG capsule Take one capsule by mouth once daily for anxiety 07/25/17  Yes Gildardo Cranker, DO  estradiol (ESTRACE) 0.1 MG/GM vaginal cream Use every other evening use in vaginal area 02/06/17  Yes [provider]  febuxostat (ULORIC) 40 MG tablet Take 1 tablet (40 mg total) by mouth daily. 07/25/17  Yes Eulas Post, Monica, DO  gabapentin (NEURONTIN) 100 MG capsule Take 100 mg by mouth at bedtime.   Yes [provider]  hydrALAZINE (APRESOLINE) 10 MG tablet Take 1 tablet (10 mg total) by mouth every 8 (eight) hours. 08/28/17  Yes Gildardo Cranker, DO  hydrOXYzine (ATARAX/VISTARIL) 10 MG tablet Take 1 tablet (10 mg total) by mouth 3 (three) times daily as needed for itching. 09/07/17  Yes Eulas Post, Monica, DO  insulin aspart (NOVOLOG FLEXPEN) 100 UNIT/ML FlexPen Before each meal 3 times a day, 140-199 - 2 units, 200-250 - 4 units, 251-299 - 6 units,  300-349 - 8 units,  350 or above 10 units. Insulin PEN if approved, provide syringes and needles if needed. 07/28/17  Yes Florencia Reasons, MD  Insulin Glargine (TOUJEO SOLOSTAR) 300 UNIT/ML SOPN Inject 35 Units into the skin 2 (two) times daily. (before breakfast and before supper) 07/28/17  Yes Florencia Reasons, MD    isosorbide mononitrate (IMDUR) 60 MG 24 hr tablet TAKE 1 TABLET BY MOUTH EVERY DAY 09/19/17  Yes Gildardo Cranker, DO  meclizine (ANTIVERT) 25 MG tablet Take one tablet by mouth three times daily as needed for dizziness 09/20/17  Yes Eulas Post, Monica, DO  memantine (NAMENDA) 5 MG tablet Take 5-10 mg by mouth See admin  instructions. Take 2 tablets (10mg ) every AM and 1 tablet (5mg ) every evening. 09/19/17  Yes [provider]  metoprolol succinate (TOPROL-XL) 25 MG 24 hr tablet TAKE 1 TABLET BY MOUTH EVERY DAY FOR blood pressure 09/19/17  Yes Carter, Monica, DO  montelukast (SINGULAIR) 10 MG tablet Take 1 tablet (10 mg total) by mouth at bedtime. For seasonal allergy 09/07/17  Yes Gildardo Cranker, DO  nitroGLYCERIN (NITROSTAT) 0.4 MG SL tablet Take one tablet under the tongue every 5 minutes as needed for chest pain 09/24/13  Yes [name], NP  oxyCODONE-acetaminophen (PERCOCET) 10-325 MG tablet Take 1 tablet by mouth every 6 (six) hours. 09/18/17  Yes Eulas Post, Monica, DO  pantoprazole (PROTONIX) 40 MG tablet Take 1 tablet (40 mg total) by mouth daily. 02/22/17  Yes Eulas Post, Monica, DO  pravastatin (PRAVACHOL) 20 MG tablet TAKE 1 TABLET BY MOUTH EVERY DAY FOR cholesterol 09/19/17  Yes Gildardo Cranker, DO  rivaroxaban (XARELTO) 10 MG TABS tablet Take 1 tablet (10 mg total) by mouth daily. 06/12/17  Yes Eulas Post, Monica, DO  sodium chloride (OCEAN) 0.65 % SOLN nasal spray Place 1 spray into both nostrils 3 (three) times daily as needed (for sinus congestion.). RINSE AS NEEDED FOR SINUS CONGESTION    Yes [provider]  budesonide-formoterol (SYMBICORT) 160-4.5 MCG/ACT inhaler Inhale 2 puffs into the lungs 2 (two) times daily. For bronchitis 10/08/17   Volanda Napoleon, PA-C  cephALEXin (KEFLEX) 500 MG capsule Take 1 capsule (500 mg total) by mouth 2 (two) times daily. Patient not taking: Reported on 10/08/2017 09/26/17   [name], NP  furosemide (LASIX) 20 MG tablet Take 1 tablet (20 mg  total) by mouth daily. 10/08/17   [place] Lanius A, PA-C  glucose blood (ONETOUCH VERIO) test strip USE 1 STRIP TO CHECK GLUCOSE THREE TIMES DAILY  E11.21 08/03/17   Gildardo Cranker, DO  Lancet Device MISC Onetouch verio machine lancets, check blood sugar twice daily as directed DX E11.69 09/07/17   Gildardo Cranker, DO  Spacer/Aero-Holding Chambers (AEROCHAMBER PLUS FLO-VU LARGE) MISC 1 each by Other route once. 11/04/15   Lysbeth Penner, FNP    Family History Family History  Problem Relation Age of Onset  . Breast cancer Mother 61  . Heart disease Mother   . Throat cancer Father   . Hypertension Father   . Arthritis Father   . Diabetes Father   . Arthritis Sister   . Obesity Sister   . Diabetes Sister   . Heart disease Cousin   . Colon cancer Neg Hx   . Stomach cancer Neg Hx   . Esophageal cancer Neg Hx     Social History Social History   Tobacco Use  . Smoking status: Never Smoker  . Smokeless tobacco: Never Used  Substance Use Topics  . Alcohol use: No    Alcohol/week: 0.0 oz  . Drug use: No     Allergies   Sulfonamide derivatives; Tramadol; and Aricept [donepezil hcl]   Review of Systems Review of Systems  Constitutional: Negative for chills and fever.  HENT: Negative for congestion.   Eyes: Negative for visual disturbance.  Respiratory: Positive for cough and shortness of breath.   Cardiovascular: Positive for leg swelling. Negative for chest pain.  Gastrointestinal: Negative for abdominal pain, diarrhea, nausea and vomiting.  Genitourinary: Negative for dysuria and hematuria.  Musculoskeletal: Negative for back pain and neck pain.  Skin: Negative for rash.  Neurological: Negative for dizziness, weakness, numbness and headaches.  Psychiatric/Behavioral:  Negative for confusion.  All other systems reviewed and are negative.    Physical Exam Updated Vital Signs BP (!) 165/101   Pulse 84   Temp 98.4 F (36.9 C)   Resp 18   Ht 5\' 7"  (1.702 m)   Wt (!)  136.5 kg (301 lb)   SpO2 100%   BMI 47.14 kg/m   Physical Exam  Constitutional: She is oriented to person, place, and time. She appears well-developed and well-nourished.  HENT:  Head: Normocephalic and atraumatic.  Mouth/Throat: Oropharynx is clear and moist and mucous membranes are normal.  Eyes: Pupils are equal, round, and reactive to light. Conjunctivae, EOM and lids are normal.  Neck: Full passive range of motion without pain.  Cardiovascular: Normal rate, regular rhythm, normal heart sounds and normal pulses. Exam reveals no gallop and no friction rub.  No murmur heard. Pulses:      Dorsalis pedis pulses are 2+ on the right side, and 2+ on the left side.  Pulmonary/Chest: Effort normal. She has wheezes. She has rales.  Able to speak in full sentences without any difficulty.  No evidence of respiratory distress.  Diffuse expiratory wheezing noted throughout all lung fields.  Mild rales noted to the lower lung fields.  Abdominal: Soft. Normal appearance. There is no tenderness. There is no rigidity and no guarding.  Musculoskeletal: Normal range of motion.  Lateral lower extremities are symmetric in appearance.  2+ pitting edema at the mid tib-fib.  Level that extends distally.  No overlying warmth, erythema.  Neurological: She is alert and oriented to person, place, and time.  Skin: Skin is warm and dry. Capillary refill takes less than 2 seconds.  Good distal cap refill. BLE are not dusky in appearance or cool to touch.  Psychiatric: She has a normal mood and affect. Her speech is normal.  Nursing note and vitals reviewed.    ED Treatments / Results  Labs (all labs ordered are listed, but only abnormal results are displayed) Labs Reviewed  BASIC METABOLIC PANEL - Abnormal; Notable for the following components:      Result Value   Glucose, Bld 230 (*)    BUN 22 (*)    Creatinine, Ser 1.26 (*)    GFR calc non Af Amer 39 (*)    GFR calc Af Amer 45 (*)    All other  components within normal limits  CBC - Abnormal; Notable for the following components:   WBC 11.0 (*)    RBC 3.38 (*)    Hemoglobin 8.3 (*)    HCT 28.0 (*)    MCH 24.6 (*)    MCHC 29.6 (*)    All other components within normal limits  BRAIN NATRIURETIC PEPTIDE - Abnormal; Notable for the following components:   B Natriuretic Peptide 104.4 (*)    All other components within normal limits  I-STAT TROPONIN, ED  I-STAT TROPONIN, ED    EKG EKG Interpretation  Date/Time:  Monday October 08 2017 12:46:06 EDT Ventricular Rate:  82 PR Interval:  178 QRS Duration: 94 QT Interval:  404 QTC Calculation: 472 R Axis:   3 Text Interpretation:  Normal sinus rhythm Normal ECG No significant change since last tracing Confirmed by Theotis Burrow [phone]) on 10/08/2017 4:55:04 PM   Radiology Dg Chest 2 View  Result Date: 10/08/2017 CLINICAL DATA:  Chest pressure and shortness of breath for the past day. History of COPD, CHF, previous MI, nonsmoker. EXAM: CHEST - 2 VIEW COMPARISON:  PA and lateral  chest x-ray of July 25, 2017 FINDINGS: The lungs are borderline hypoinflated. The right hemidiaphragm is slightly higher than the left. The interstitial markings are coarse and slightly more conspicuous overall today. The cardiac silhouette is mildly enlarged. The pulmonary vascularity is not engorged. There is calcification in the wall of the aortic arch. There is multilevel degenerative disc disease of the thoracic spine. IMPRESSION: Mild interstitial prominence may reflect low-grade CHF. There are underlying chronic bronchitic changes. No alveolar pneumonia. Thoracic aortic atherosclerosis. Electronically Signed   By: David  Martinique M.D.   On: 10/08/2017 13:42    Procedures Procedures (including critical care time)  Medications Ordered in ED Medications  ipratropium-albuterol (DUONEB) 0.5-2.5 (3) MG/3ML nebulizer solution 3 mL (3 mLs Nebulization Given 10/08/17 1452)  furosemide (LASIX) injection 40 mg (40 mg  Intravenous Given 10/08/17 1633)  hydrALAZINE (APRESOLINE) tablet 10 mg (10 mg Oral Given 10/08/17 1634)     Initial Impression / Assessment and Plan / ED Course  I have reviewed the triage vital signs and the nursing notes.  Pertinent labs & imaging results that were available during my care of the patient were reviewed by me and considered in my medical decision making (see chart for details).     83 y.o. F with PMH/o CHF, COPD, MI, DM, HTN who presents for evaluation of shortness of breath that has been worsening over last 3 days and bilateral lower extremity edema that is been ongoing for last 3 to 4 months.  Patient reports a history of CHF and states that she thinks she is on a fluid pill.  She has reported some increased PND and orthopnea.  Patient reports that she is currently on Xarelto and states she has been compliant with medication.  Patient states that she recently returned back from [place] which was a 4-hour car ride.  Denies any fevers, chest pain. Patient is afebrile, non-toxic appearing, sitting comfortably on examination table. Vital signs reviewed and stable.  On exam, patient has rales noted to the lower lung fields and diffuse expiratory wheezing noted throughout.  2+ pitting edema noted to bilateral lower extremities.  There is symmetric in appearance with overlying warmth, erythema, edema.  Consider ACS etiology versus infectious etiology versus COPD exacerbation versus CHF exacerbation. Plan to check basic labs, EKG, chest x-ray.  Will give breathing treatment for COPD given wheezing noted on exam.   Confirmed with pharmacy that patient is not on any diuretics.  Patient states that she thinks she is on one and I do see mention of patient supposed to being on furosemide daily but do not see any indication that she has been taking this.  We will start her on a low dose of IV Lasix here in the ED to help with symptoms.  Plan for blood pressure medications here in the  ED.  BMP shows glucose of 230.  BUN is 22.  Creatinine is 1.26.  I-STAT troponin is negative.  CBC shows leukocytosis of 11.0.  Hemoglobin is 8.3 hematocrit is 28.0.  Platelets are 331.  Review of records show that patient has been anemic previously.  She has gone down to 7 before.  It looks like her most recent hemoglobin in April 2018 was 9.  Reevaluation after nebulizer treatment.  Improvement in wheezing noted.  Vital signs are stable.  Given patient's history and risk factors, she has a heart score of 4. Will delta trop.   We will plan to endplate with pulse ox after Lasix given.  If  patient able to ambulate without any symptoms and O2 sats remained stable, plan for discharge.  Will restart patient on Lasix.  Patient signed out to Dr. Berline Lopes with troponin and pulse ox ambulation pending.  Please see her note for further ED course.   Final Clinical Impressions(s) / ED Diagnoses   Final diagnoses:  Shortness of breath  Acute on chronic congestive heart failure, unspecified heart failure type [facility])    ED Discharge Orders        Ordered    budesonide-formoterol (SYMBICORT) 160-4.5 MCG/ACT inhaler  2 times daily     10/08/17 1700    furosemide (LASIX) 20 MG tablet  Daily     10/08/17 1700       Volanda Napoleon, PA-C 10/09/17 1512    Charlesetta Shanks, MD 10/10/17 0009

## 2017-10-08 NOTE — ED Triage Notes (Signed)
Pt here for left sided CP and pressure with SOB x 4 hours this am; pt had recent long car ride and takes xerelto

## 2017-10-08 NOTE — ED Notes (Signed)
Pt had 5 visitors in the room, RN unable to reach several objects and maneuver around the room.  RN asked family to keep only two visitors in the room at a time.  Family understanding, "we knew".  PR repositioned pt for comfort.

## 2017-10-08 NOTE — ED Triage Notes (Signed)
Pt states edema to lower extremities for 3-4 months, having to wear bed room slippers. States that for about 4 hours she has had shortness of breath with chest pain, no chest pain currently. Pain was to center of chest, states shortness of breath is currently no more than usual. Endorses recent travel, does take xerelto.

## 2017-10-08 NOTE — H&P (Signed)
CECIL BIXBY XHB:[id number] DOB: Mar 03, 1935 DOA: 10/08/2017     PCP: Gildardo Cranker, DO   Outpatient Specialists:  CARDS:   Dr. Cathie Olden    Oncology  Dr. Duwaine Maxin Patient arrived to ER on 10/08/17 at 1236  Patient coming from:    home Lives With family    Chief Complaint:  Chief Complaint  Patient presents with  . Shortness of Breath  . Congestive Heart Failure    HPI: [name] is a 83 y.o. female with medical history significant of anemia, CHF, CAD, COPD depression, diabetes dementia History of right lower extremity DVT and PE   Presented with shortness of breath and left-sided chest pain Reports lower extremity swelling for the past 3 to 4 months. Patient had chest pain which was central lasting for about 4 hours associated with worsening shortness of breath currently has resolved. Severe dyspnea with exertion she is unable to even transfer out of the bed.  Mild cough she has had some dark phlegm but did not come for blood she required to 3 pillows to sleep. Patient does have history of recent travel to [place] of breath have been gradual Regarding pertinent Chronic problems:   Known history of CAD status post MI in 2004 Last echogram in March 2019 showing preserved EF unable to assess for diastolic dysfunction. History of diabetes mellitus on insulin Toujeo 35 units BID  While in ER:   Following Medications were ordered in ER: Medications  ipratropium-albuterol (DUONEB) 0.5-2.5 (3) MG/3ML nebulizer solution 3 mL (3 mLs Nebulization Given 10/08/17 1452)  furosemide (LASIX) injection 40 mg (40 mg Intravenous Given 10/08/17 1633)  hydrALAZINE (APRESOLINE) tablet 10 mg (10 mg Oral Given 10/08/17 1634)    Significant initial  Findings: Abnormal Labs Reviewed  BASIC METABOLIC PANEL - Abnormal; Notable for the following components:      Result Value   Glucose, Bld 230 (*)    BUN 22 (*)    Creatinine, Ser 1.26 (*)    GFR calc non Af Amer 39  (*)    GFR calc Af Amer 45 (*)    All other components within normal limits  CBC - Abnormal; Notable for the following components:   WBC 11.0 (*)    RBC 3.38 (*)    Hemoglobin 8.3 (*)    HCT 28.0 (*)    MCH 24.6 (*)    MCHC 29.6 (*)    All other components within normal limits  BRAIN NATRIURETIC PEPTIDE - Abnormal; Notable for the following components:   B Natriuretic Peptide 104.4 (*)    All other components within normal limits     Na 138 K 4.4  Cr   stable,   Lab Results  Component Value Date   CREATININE 1.26 (H) 10/08/2017   CREATININE 1.41 (H) 09/07/2017   CREATININE 1.69 (H) 08/27/2017      WBC 11  HG/HCT   Stable,     Component Value Date/Time   HGB 8.3 (L) 10/08/2017 1302   HGB 8.7 (L) 03/21/2017 1351   HCT 28.0 (L) 10/08/2017 1302   HCT 28.9 (L) 03/21/2017 1351       Troponin (Point of Care Test) Recent Labs    10/08/17 1314  TROPIPOC 0.02     BNP (last 3 results) Recent Labs    10/08/17 1302  BNP 104.4*    ProBNP (last 3 results) No results for input(s): PROBNP in the last 8760 hours.  Lactic Acid, Venous  Component Value Date/Time   LATICACIDVEN 2.06 (HH) 01/20/2015 2058    BNP 104.4   UA not ordered  CXR -  MILD CHF    ECG:  Personally reviewed by me showing: HR : 82 Rhythm NSR,    no evidence of ischemic changes QTC 457     ED Triage Vitals [10/08/17 1245]  Enc Vitals Group     BP (!) 164/73     Pulse Rate 78     Resp 19     Temp 98.4 F (36.9 C)     Temp src      SpO2 98 %     Weight (!) 301 lb (136.5 kg)     Height 5\' 7"  (1.702 m)     Head Circumference      Peak Flow      Pain Score 0     Pain Loc      Pain Edu?      Excl. in [place]?   LNLG(92)@       Latest  Blood pressure 135/82, pulse 83, temperature 98.4 F (36.9 C), resp. rate 17, height 5\' 7"  (1.702 m), weight (!) 136.5 kg (301 lb), SpO2 100 %.    Hospitalist was called for admission for CHF exacerbation   Review of Systems:    Pertinent  positives include:  shortness of breath at rest.dyspnea on exertion  Constitutional:  No weight loss, night sweats, Fevers, chills, fatigue, weight loss  HEENT:  No headaches, Difficulty swallowing,Tooth/dental problems,Sore throat,  No sneezing, itching, ear ache, nasal congestion, post nasal drip,  Cardio-vascular:  No chest pain, Orthopnea, PND, anasarca, dizziness, palpitations.no Bilateral lower extremity swelling  GI:  No heartburn, indigestion, abdominal pain, nausea, vomiting, diarrhea, change in bowel habits, loss of appetite, melena, blood in stool, hematemesis Resp:    No excess mucus, no productive cough, No non-productive cough, No coughing up of blood.No change in color of mucus.No wheezing. Skin:  no rash or lesions. No jaundice GU:  no dysuria, change in color of urine, no urgency or frequency. No straining to urinate.  No flank pain.  Musculoskeletal:  No joint pain or no joint swelling. No decreased range of motion. No back pain.  Psych:  No change in mood or affect. No depression or anxiety. No memory loss.  Neuro: no localizing neurological complaints, no tingling, no weakness, no double vision, no gait abnormality, no slurred speech, no confusion  As per HPI otherwise 10 point review of systems negative.   Past Medical History:   Past Medical History:  Diagnosis Date  . Allergy    takes Mucinex daily as needed  . Anemia, unspecified   . Anxiety    takes Clonazepam daily as needed  . Arthritis   . CHF (congestive heart failure) (HCC)    takes Furosemide daily  . Coronary artery disease    a. s/p IMI 2004 tx with BMS to RCA;  b. s/p Promus DES to RCA 2/12 (cath: LM 20-30%, pLAD 20-30%, mLAD 50%, RI 30%, pRCA 60%, mRCA 90% - tx with PCI);  c. myoview 1/07: EF 49%, inf scar, no isch  . Coronary atherosclerosis of native coronary artery   . Depression    takes Cymbalta daily  . Diabetes mellitus    insulin daily  . Dysuria   . GERD (gastroesophageal  reflux disease)    takes Protonix daily  . Gout, unspecified    takes Colchicine daily  . Headache    occasionally  . History  of blood clots about 7yrs ago   in legs-takes Coumadin   . Hyperlipidemia    takes Pravastatin daily  . Hypertension    takes Imdur and Metoprolol daily  . Insomnia   . Joint pain   . Joint swelling   . Myocardial infarction ([place]) 2004  . Numbness   . Obstructive sleep apnea   . Osteoarthritis   . Osteoarthritis   . Osteoarthrosis, unspecified whether generalized or localized, lower leg   . Pain, chronic   . Polymyalgia rheumatica ([place])   . Pulmonary emboli ([place]) 9/13   felt to need lifelong anticoagulation  . Shortness of breath dyspnea    with exertion and has Albuterol inhaler prn  . Type II or unspecified type diabetes mellitus without mention of complication, uncontrolled   . Urinary incontinence    takes Linzess daily  . Vertigo    hx of;was taking Meclizine if needed      Past Surgical History:  Procedure Laterality Date  . ABDOMINAL HYSTERECTOMY     partial  . APPENDECTOMY    . blood clots/legs and lungs  2013  . BREAST BIOPSY Left 07/22/2014  . BREAST BIOPSY Left 02/10/2013  . BREAST LUMPECTOMY Left 11/05/2014  . BREAST LUMPECTOMY WITH RADIOACTIVE SEED LOCALIZATION Left 11/05/2014   Procedure: LEFT BREAST LUMPECTOMY WITH RADIOACTIVE SEED LOCALIZATION;  Surgeon: [name], MD;  Location: [facility];  Service: General;  Laterality: Left;  . CARDIAC CATHETERIZATION    . COLONOSCOPY    . CORONARY ANGIOPLASTY  2  . ESOPHAGOGASTRODUODENOSCOPY (EGD) WITH PROPOFOL N/A 11/07/2016   Procedure: ESOPHAGOGASTRODUODENOSCOPY (EGD) WITH PROPOFOL;  Surgeon: Gatha Mayer, MD;  Location: [facility];  Service: Endoscopy;  Laterality: N/A;  . EXCISION OF SKIN TAG Right 11/05/2014   Procedure: EXCISION OF RIGHT EYELID SKIN TAG;  Surgeon: [name], MD;  Location: [facility];  Service: General;  Laterality: Right;  . EYE SURGERY Bilateral     cataract   . GASTRIC BYPASS  1977    reversed in 1979, [place] N/A 06/29/2014   Procedure: LEFT HEART CATHETERIZATION WITH CORONARY ANGIOGRAM;  Surgeon: Troy Sine, MD;  Location: [facility];  Service: Cardiovascular;  Laterality: N/A;  . MI with stent placement  2004    Social History:  Ambulatory   walker  Or wheelchair bound,      reports that she has never smoked. She has never used smokeless tobacco. She reports that she does not drink alcohol or use drugs.     Family History:   Family History  Problem Relation Age of Onset  . Breast cancer Mother 66  . Heart disease Mother   . Throat cancer Father   . Hypertension Father   . Arthritis Father   . Diabetes Father   . Arthritis Sister   . Obesity Sister   . Diabetes Sister   . Heart disease Cousin   . Colon cancer Neg Hx   . Stomach cancer Neg Hx   . Esophageal cancer Neg Hx     Allergies: Allergies  Allergen Reactions  . Sulfonamide Derivatives Swelling    Mouth swelling  . Tramadol Nausea And Vomiting  . Aricept [Donepezil Hcl]     GI upset/loose stools     Prior to Admission medications   Medication Sig Start Date End Date Taking? Authorizing Provider  albuterol (PROVENTIL HFA;VENTOLIN HFA) 108 (90 Base) MCG/ACT inhaler Inhale 1-2 puffs into the lungs every 6 (  six) hours as needed for wheezing or shortness of breath. 03/15/16  Yes Robyn Haber, MD  anastrozole (ARIMIDEX) 1 MG tablet Take 1 tablet (1 mg total) by mouth daily. 09/20/16  Yes [name], MD  Biotin 10000 MCG TABS Take 1 tablet by mouth daily.   Yes [provider]  cetirizine (ZYRTEC) 10 MG tablet Take 10 mg by mouth at bedtime as needed for allergies.    Yes [provider]  Cholecalciferol (VITAMIN D3) 2000 units TABS Take 2 tablets by mouth.    Yes [provider]  clonazePAM (KLONOPIN) 1 MG tablet TAKE 2 TABLETS BY MOUTH AT BEDTIME FOR  ANXIETY 09/19/17  Yes Gildardo Cranker, DO  docusate sodium (COLACE) 100 MG capsule Take 100 mg by mouth daily as needed for mild constipation.   Yes [provider]  DULoxetine (CYMBALTA) 60 MG capsule Take one capsule by mouth once daily for anxiety 07/25/17  Yes Gildardo Cranker, DO  estradiol (ESTRACE) 0.1 MG/GM vaginal cream Use every other evening use in vaginal area 02/06/17  Yes [provider]  febuxostat (ULORIC) 40 MG tablet Take 1 tablet (40 mg total) by mouth daily. 07/25/17  Yes Eulas Post, Monica, DO  gabapentin (NEURONTIN) 100 MG capsule Take 100 mg by mouth at bedtime.   Yes [provider]  hydrALAZINE (APRESOLINE) 10 MG tablet Take 1 tablet (10 mg total) by mouth every 8 (eight) hours. 08/28/17  Yes Gildardo Cranker, DO  hydrOXYzine (ATARAX/VISTARIL) 10 MG tablet Take 1 tablet (10 mg total) by mouth 3 (three) times daily as needed for itching. 09/07/17  Yes Eulas Post, Monica, DO  insulin aspart (NOVOLOG FLEXPEN) 100 UNIT/ML FlexPen Before each meal 3 times a day, 140-199 - 2 units, 200-250 - 4 units, 251-299 - 6 units,  300-349 - 8 units,  350 or above 10 units. Insulin PEN if approved, provide syringes and needles if needed. 07/28/17  Yes Florencia Reasons, MD  Insulin Glargine (TOUJEO SOLOSTAR) 300 UNIT/ML SOPN Inject 35 Units into the skin 2 (two) times daily. (before breakfast and before supper) 07/28/17  Yes Florencia Reasons, MD  isosorbide mononitrate (IMDUR) 60 MG 24 hr tablet TAKE 1 TABLET BY MOUTH EVERY DAY 09/19/17  Yes Gildardo Cranker, DO  meclizine (ANTIVERT) 25 MG tablet Take one tablet by mouth three times daily as needed for dizziness 09/20/17  Yes Eulas Post, Monica, DO  memantine (NAMENDA) 5 MG tablet Take 5-10 mg by mouth See admin instructions. Take 2 tablets (10mg ) every AM and 1 tablet (5mg ) every evening. 09/19/17  Yes [provider]  metoprolol succinate (TOPROL-XL) 25 MG 24 hr tablet TAKE 1 TABLET BY MOUTH EVERY DAY FOR blood pressure 09/19/17  Yes Carter, Monica, DO    montelukast (SINGULAIR) 10 MG tablet Take 1 tablet (10 mg total) by mouth at bedtime. For seasonal allergy 09/07/17  Yes Gildardo Cranker, DO  nitroGLYCERIN (NITROSTAT) 0.4 MG SL tablet Take one tablet under the tongue every 5 minutes as needed for chest pain 09/24/13  Yes [name], NP  oxyCODONE-acetaminophen (PERCOCET) 10-325 MG tablet Take 1 tablet by mouth every 6 (six) hours. 09/18/17  Yes Eulas Post, Monica, DO  pantoprazole (PROTONIX) 40 MG tablet Take 1 tablet (40 mg total) by mouth daily. 02/22/17  Yes Eulas Post, Monica, DO  pravastatin (PRAVACHOL) 20 MG tablet TAKE 1 TABLET BY MOUTH EVERY DAY FOR cholesterol 09/19/17  Yes Gildardo Cranker, DO  rivaroxaban (XARELTO) 10 MG TABS tablet Take 1 tablet (10 mg total) by mouth daily. 06/12/17  Yes Gildardo Cranker, DO  sodium chloride (OCEAN) 0.65 % SOLN nasal spray Place 1 spray into both nostrils 3 (three) times daily as needed (for sinus congestion.). RINSE AS NEEDED FOR SINUS CONGESTION    Yes [provider]  budesonide-formoterol (SYMBICORT) 160-4.5 MCG/ACT inhaler Inhale 2 puffs into the lungs 2 (two) times daily. For bronchitis 10/08/17   Volanda Napoleon, PA-C  cephALEXin (KEFLEX) 500 MG capsule Take 1 capsule (500 mg total) by mouth 2 (two) times daily. Patient not taking: Reported on 10/08/2017 09/26/17   [name], NP  furosemide (LASIX) 20 MG tablet Take 1 tablet (20 mg total) by mouth daily. 10/08/17   Providence Lanius A, PA-C  glucose blood (ONETOUCH VERIO) test strip USE 1 STRIP TO CHECK GLUCOSE THREE TIMES DAILY  E11.21 08/03/17   Gildardo Cranker, DO  Lancet Device MISC Onetouch verio machine lancets, check blood sugar twice daily as directed DX E11.69 09/07/17   Gildardo Cranker, DO  Spacer/Aero-Holding Chambers (AEROCHAMBER PLUS FLO-VU LARGE) MISC 1 each by Other route once. 11/04/15   Lysbeth Penner, FNP   Physical Exam: Blood pressure 135/82, pulse 83, temperature 98.4 F (36.9 C), resp. rate 17, height 5\' 7"  (1.702 m), weight  (!) 136.5 kg (301 lb), SpO2 100 %. 1. General:  in No Acute distress   Chronically ill  -appearing 2. Psychological: Alert and  Oriented 3. Head/ENT:   Moist  Mucous Membranes                          Head Non traumatic, neck supple                          Poor Dentition 4. SKIN: normal  Skin turgor,  Skin clean Dry and intact no rash 5. Heart: Regular rate and rhythm no  Murmur, no Rub or gallop 6. Lungs: no wheezes or crackles   7. Abdomen: Soft,  non-tender, Non distended   obese  bowel sounds present 8. Lower extremities: no clubbing, cyanosis, 2+ edema bilateral 9. Neurologically Grossly intact, moving all 4 extremities equally  10. MSK: Normal range of motion   LABS:     Recent Labs  Lab 10/08/17 1302  WBC 11.0*  HGB 8.3*  HCT 28.0*  MCV 82.8  PLT 829   Basic Metabolic Panel: Recent Labs  Lab 10/08/17 1302  NA 138  K 4.4  CL 103  CO2 28  GLUCOSE 230*  BUN 22*  CREATININE 1.26*  CALCIUM 8.9      No results for input(s): AST, ALT, ALKPHOS, BILITOT, PROT, ALBUMIN in the last 168 hours. No results for input(s): LIPASE, AMYLASE in the last 168 hours. No results for input(s): AMMONIA in the last 168 hours.    HbA1C: No results for input(s): HGBA1C in the last 72 hours. CBG: No results for input(s): GLUCAP in the last 168 hours.    Urine analysis:    Component Value Date/Time   COLORURINE YELLOW 07/25/2017 1921   APPEARANCEUR CLEAR 07/25/2017 1921   LABSPEC 1.012 07/25/2017 1921   PHURINE 5.0 07/25/2017 1921   GLUCOSEU NEGATIVE 07/25/2017 1921   HGBUR SMALL (A) 07/25/2017 1921   BILIRUBINUR NEGATIVE 07/25/2017 1921   BILIRUBINUR Neg 06/29/2017 0908   KETONESUR NEGATIVE 07/25/2017 1921   PROTEINUR NEGATIVE 07/25/2017 1921   UROBILINOGEN 0.2 06/29/2017 0908   UROBILINOGEN 0.2 03/15/2016 1722   NITRITE NEGATIVE 07/25/2017 1921   LEUKOCYTESUR NEGATIVE  07/25/2017 1921       Cultures:    Component Value Date/Time   SDES  07/25/2017 1922     URINE, RANDOM Performed at [facility], [place] [address], [place], [place] [address]    [place]  07/25/2017 1922    NONE Performed at [facility], [place] [address] [place], [place] [address]    CULT MULTIPLE SPECIES PRESENT, SUGGEST RECOLLECTION (A) 07/25/2017 1922   REPTSTATUS 07/27/2017 FINAL 07/25/2017 1922     Radiological Exams on Admission: Dg Chest 2 View  Result Date: 10/08/2017 CLINICAL DATA:  Chest pressure and shortness of breath for the past day. History of COPD, CHF, previous MI, nonsmoker. EXAM: CHEST - 2 VIEW COMPARISON:  PA and lateral chest x-ray of July 25, 2017 FINDINGS: The lungs are borderline hypoinflated. The right hemidiaphragm is slightly higher than the left. The interstitial markings are coarse and slightly more conspicuous overall today. The cardiac silhouette is mildly enlarged. The pulmonary vascularity is not engorged. There is calcification in the wall of the aortic arch. There is multilevel degenerative disc disease of the thoracic spine. IMPRESSION: Mild interstitial prominence may reflect low-grade CHF. There are underlying chronic bronchitic changes. No alveolar pneumonia. Thoracic aortic atherosclerosis. Electronically Signed   By: David  Martinique M.D.   On: 10/08/2017 13:42    Chart has been reviewed    Assessment/Plan   83 y.o. female with medical history significant of anemia, CHF, CAD, COPD depression, diabetes dementia History of right lower extremity DVT and PE on Xarelto      Admitted for CHF exacerbation  Present on Admission: . Acute on chronic diastolic CHF (congestive heart failure) ([place]) -  - admit on telemetry,  cycle cardiac enzymes,  obtain serial ECG  to evaluate for ischemia as a cause of heart failure  monitor daily weight:  Filed Weights   10/08/17 1245  Weight: (!) 136.5 kg (301 lb)   Last BNP BNP (last 3 results) Recent Labs    10/08/17 1302  BNP 104.4*       diurese with IV  lasix and monitor orthostatics and creatinine to avoid over diuresis.  Order echogram to evaluate EF and valves  ACE/ARBi   Contraindicated    cardiology consulted   . Breast cancer ([place]) stable continue home medications . CAD (coronary artery disease) -  - chronic, continue   beta blocker   . Chronic pain syndrome able continue medications . CKD (chronic kidney disease) stage 3, GFR 30-59 ml/min (HCC) stable avoid nephrotoxic medication   . COPD (chronic obstructive pulmonary disease) ([place]) stable make sure has PRN nebulizers and restart home medications . Dyslipidemia associated with type 2 diabetes mellitus ([place]) -  - Order Sensitive  SSI   - continue home insulin regimen   -  check TSH and HgA1C  - Hold by mouth medications    . Essential hypertension stable restart home medicines . GERD with esophagitis continue Protonix . ANEMIA -will obtain anemia panel, stable Remote history of DVT PE patient is on chronic anticoagulation will continue  Other plan as per orders.  DVT prophylaxis:  xarelto    Code Status:   DNR/DNI  as per patient  I had personally discussed CODE STATUS with patient and family   Family Communication:   Family  at  Bedside  plan of care was discussed with [place], Daughter  Disposition Plan:     To home once workup is complete and patient is stable  Consults called: email cardiology   Admission status  inpatient       Level of care    tele          Toy Baker 10/08/2017, 10:08 PM    [facility]  Pager [phone]   after 2 AM please page floor coverage PA If 7AM-7PM, please contact the day team taking care of the patient  [email or web address]  Password [email or web address]

## 2017-10-08 NOTE — Discharge Instructions (Addendum)
Take Lasix as directed.   Use your inhalers as directed.   Follow-up with your primary care doctor. Call their office and arrange for an appointment in the next 2-4 days.   Return to the emergency department for any chest pain, difficulty breathing, worsening leg swelling, nausea/vomiting, any other worsening or concerning symptoms. --------------- Information on my medicine - XARELTO (Rivaroxaban)  Why was Xarelto prescribed for you? Xarelto was prescribed for you to reduce the risk of a blood clot forming that can cause a stroke if you have a medical condition called atrial fibrillation (a type of irregular heartbeat).  What do you need to know about xarelto ? Take your Xarelto ONCE DAILY at the same time every day with your evening meal. If you have difficulty swallowing the tablet whole, you may crush it and mix in applesauce just prior to taking your dose.  Take Xarelto exactly as prescribed by your doctor and DO NOT stop taking Xarelto without talking to the doctor who prescribed the medication.  Stopping without other stroke prevention medication to take the place of Xarelto may increase your risk of developing a clot that causes a stroke.  Refill your prescription before you run out.  After discharge, you should have regular check-up appointments with your healthcare provider that is prescribing your Xarelto.  In the future your dose may need to be changed if your kidney function or weight changes by a significant amount.  What do you do if you miss a dose? If you are taking Xarelto ONCE DAILY and you miss a dose, take it as soon as you remember on the same day then continue your regularly scheduled once daily regimen the next day. Do not take two doses of Xarelto at the same time or on the same day.   Important Safety Information A possible side effect of Xarelto is bleeding. You should call your healthcare provider right away if you experience any of the  following: ? Bleeding from an injury or your nose that does not stop. ? Unusual colored urine (red or dark brown) or unusual colored stools (red or black). ? Unusual bruising for unknown reasons. ? A serious fall or if you hit your head (even if there is no bleeding).  Some medicines may interact with Xarelto and might increase your risk of bleeding while on Xarelto. To help avoid this, consult your healthcare provider or pharmacist prior to using any new prescription or non-prescription medications, including herbals, vitamins, non-steroidal anti-inflammatory drugs (NSAIDs) and supplements.  This website has more information on Xarelto: [email or web address]

## 2017-10-08 NOTE — ED Notes (Signed)
Bed: UCTR Expected date:  Expected time:  Means of arrival:  Comments: 

## 2017-10-08 NOTE — Patient Outreach (Addendum)
[facility]) Care Management  10/08/2017  [name] [birth year] [id number]   Unsuccessful outreach call attempt #1 to patient daughter, [name], HIPAA compliant voicemail left.  Will make 2nd attempt in the next 2-3 business days if call is not returned.  Maud Deed [place], [place] Management [phone]  ADDENDUM:  Incoming call from patients daughter [name]. I stated that I had just left her a voicemail for her to return my call about patient assistance. [name] stated that her mother was having trouble breathing and requested me to send a nurse out. Provided her with the 24 hour nurse line number and also suggested that depending on the severity of her breathing she should contact her provider and/or call EMS to have them check on her. She stated she understood.   Will make 3rd call attempt in the next 2-3 business days.  Maud Deed [place], [place] Management [phone]

## 2017-10-08 NOTE — ED Notes (Signed)
Dr. Rex Kras made aware of pt SOB moving from wheelchair to bed.

## 2017-10-09 ENCOUNTER — Inpatient Hospital Stay: Payer: PPO

## 2017-10-09 ENCOUNTER — Inpatient Hospital Stay (HOSPITAL_COMMUNITY): Payer: PPO

## 2017-10-09 ENCOUNTER — Other Ambulatory Visit: Payer: Self-pay | Admitting: *Deleted

## 2017-10-09 ENCOUNTER — Telehealth: Payer: Self-pay

## 2017-10-09 ENCOUNTER — Inpatient Hospital Stay: Payer: PPO | Admitting: Hematology

## 2017-10-09 ENCOUNTER — Other Ambulatory Visit (HOSPITAL_COMMUNITY): Payer: PPO

## 2017-10-09 ENCOUNTER — Encounter (HOSPITAL_COMMUNITY): Payer: Self-pay

## 2017-10-09 ENCOUNTER — Other Ambulatory Visit: Payer: Self-pay

## 2017-10-09 DIAGNOSIS — I5033 Acute on chronic diastolic (congestive) heart failure: Secondary | ICD-10-CM

## 2017-10-09 DIAGNOSIS — I509 Heart failure, unspecified: Secondary | ICD-10-CM | POA: Insufficient documentation

## 2017-10-09 DIAGNOSIS — R937 Abnormal findings on diagnostic imaging of other parts of musculoskeletal system: Secondary | ICD-10-CM | POA: Insufficient documentation

## 2017-10-09 DIAGNOSIS — N189 Chronic kidney disease, unspecified: Secondary | ICD-10-CM | POA: Insufficient documentation

## 2017-10-09 DIAGNOSIS — Z170 Estrogen receptor positive status [ER+]: Secondary | ICD-10-CM | POA: Diagnosis not present

## 2017-10-09 DIAGNOSIS — I130 Hypertensive heart and chronic kidney disease with heart failure and stage 1 through stage 4 chronic kidney disease, or unspecified chronic kidney disease: Secondary | ICD-10-CM | POA: Insufficient documentation

## 2017-10-09 DIAGNOSIS — E1122 Type 2 diabetes mellitus with diabetic chronic kidney disease: Secondary | ICD-10-CM | POA: Insufficient documentation

## 2017-10-09 DIAGNOSIS — E785 Hyperlipidemia, unspecified: Secondary | ICD-10-CM | POA: Insufficient documentation

## 2017-10-09 DIAGNOSIS — E1165 Type 2 diabetes mellitus with hyperglycemia: Secondary | ICD-10-CM | POA: Diagnosis not present

## 2017-10-09 DIAGNOSIS — R52 Pain, unspecified: Secondary | ICD-10-CM | POA: Diagnosis not present

## 2017-10-09 DIAGNOSIS — R627 Adult failure to thrive: Secondary | ICD-10-CM

## 2017-10-09 DIAGNOSIS — E559 Vitamin D deficiency, unspecified: Secondary | ICD-10-CM | POA: Insufficient documentation

## 2017-10-09 DIAGNOSIS — I2510 Atherosclerotic heart disease of native coronary artery without angina pectoris: Secondary | ICD-10-CM | POA: Insufficient documentation

## 2017-10-09 DIAGNOSIS — R0602 Shortness of breath: Secondary | ICD-10-CM

## 2017-10-09 DIAGNOSIS — Z79811 Long term (current) use of aromatase inhibitors: Secondary | ICD-10-CM | POA: Insufficient documentation

## 2017-10-09 DIAGNOSIS — E119 Type 2 diabetes mellitus without complications: Secondary | ICD-10-CM

## 2017-10-09 DIAGNOSIS — D649 Anemia, unspecified: Secondary | ICD-10-CM | POA: Diagnosis not present

## 2017-10-09 DIAGNOSIS — C50912 Malignant neoplasm of unspecified site of left female breast: Secondary | ICD-10-CM | POA: Insufficient documentation

## 2017-10-09 DIAGNOSIS — M1990 Unspecified osteoarthritis, unspecified site: Secondary | ICD-10-CM | POA: Insufficient documentation

## 2017-10-09 LAB — HEMOGLOBIN A1C
Hgb A1c MFr Bld: 9.8 % — ABNORMAL HIGH (ref 4.8–5.6)
Mean Plasma Glucose: 234.56 mg/dL

## 2017-10-09 LAB — COMPREHENSIVE METABOLIC PANEL
ALT: 11 U/L — ABNORMAL LOW (ref 14–54)
AST: 12 U/L — ABNORMAL LOW (ref 15–41)
Albumin: 2.8 g/dL — ABNORMAL LOW (ref 3.5–5.0)
Alkaline Phosphatase: 76 U/L (ref 38–126)
Anion gap: 11 (ref 5–15)
BUN: 22 mg/dL — ABNORMAL HIGH (ref 6–20)
CO2: 29 mmol/L (ref 22–32)
Calcium: 9.1 mg/dL (ref 8.9–10.3)
Chloride: 99 mmol/L — ABNORMAL LOW (ref 101–111)
Creatinine, Ser: 1.23 mg/dL — ABNORMAL HIGH (ref 0.44–1.00)
GFR calc Af Amer: 47 mL/min — ABNORMAL LOW (ref >60)
GFR calc non Af Amer: 40 mL/min — ABNORMAL LOW (ref >60)
Glucose, Bld: 214 mg/dL — ABNORMAL HIGH (ref 65–99)
Potassium: 3.8 mmol/L (ref 3.5–5.1)
Sodium: 139 mmol/L (ref 135–145)
Total Bilirubin: 0.5 mg/dL (ref 0.3–1.2)
Total Protein: 8 g/dL (ref 6.5–8.1)

## 2017-10-09 LAB — MAGNESIUM: Magnesium: 1.2 mg/dL — ABNORMAL LOW (ref 1.7–2.4)

## 2017-10-09 LAB — FOLATE: Folate: 6.7 ng/mL (ref >5.9)

## 2017-10-09 LAB — IRON AND TIBC
Iron: 38 ug/dL (ref 28–170)
Saturation Ratios: 12 % (ref 10.4–31.8)
TIBC: 308 ug/dL (ref 250–450)
UIBC: 270 ug/dL

## 2017-10-09 LAB — CBC
HCT: 29 % — ABNORMAL LOW (ref 36.0–46.0)
Hemoglobin: 8.7 g/dL — ABNORMAL LOW (ref 12.0–15.0)
MCH: 24.6 pg — ABNORMAL LOW (ref 26.0–34.0)
MCHC: 30 g/dL (ref 30.0–36.0)
MCV: 81.9 fL (ref 78.0–100.0)
Platelets: 337 10*3/uL (ref 150–400)
RBC: 3.54 MIL/uL — ABNORMAL LOW (ref 3.87–5.11)
RDW: 14.6 % (ref 11.5–15.5)
WBC: 13.1 10*3/uL — ABNORMAL HIGH (ref 4.0–10.5)

## 2017-10-09 LAB — FERRITIN: Ferritin: 25 ng/mL (ref 11–307)

## 2017-10-09 LAB — GLUCOSE, CAPILLARY
Glucose-Capillary: 197 mg/dL — ABNORMAL HIGH (ref 65–99)
Glucose-Capillary: 244 mg/dL — ABNORMAL HIGH (ref 65–99)
Glucose-Capillary: 266 mg/dL — ABNORMAL HIGH (ref 65–99)
Glucose-Capillary: 288 mg/dL — ABNORMAL HIGH (ref 65–99)
Glucose-Capillary: 310 mg/dL — ABNORMAL HIGH (ref 65–99)

## 2017-10-09 LAB — PHOSPHORUS: Phosphorus: 3.7 mg/dL (ref 2.5–4.6)

## 2017-10-09 LAB — TROPONIN I
Troponin I: <0.03 ng/mL (ref <0.03)
Troponin I: <0.03 ng/mL (ref <0.03)

## 2017-10-09 LAB — RETICULOCYTES
RBC.: 3.54 MIL/uL — ABNORMAL LOW (ref 3.87–5.11)
Retic Count, Absolute: 60.2 10*3/uL (ref 19.0–186.0)
Retic Ct Pct: 1.7 % (ref 0.4–3.1)

## 2017-10-09 LAB — TSH: TSH: 0.884 u[IU]/mL (ref 0.350–4.500)

## 2017-10-09 LAB — VITAMIN B12: Vitamin B-12: 351 pg/mL (ref 180–914)

## 2017-10-09 MED ORDER — IPRATROPIUM-ALBUTEROL 0.5-2.5 (3) MG/3ML IN SOLN: 3.0000 mL | Freq: Four times a day (QID) | RESPIRATORY_TRACT | Status: DC | PRN

## 2017-10-09 MED ORDER — IPRATROPIUM-ALBUTEROL 0.5-2.5 (3) MG/3ML IN SOLN
3.0000 mL | Freq: Three times a day (TID) | RESPIRATORY_TRACT | Status: DC
  Filled 2017-10-09: qty 3

## 2017-10-09 MED ORDER — MAGNESIUM SULFATE 2 GM/50ML IV SOLN
2.0000 g | Freq: Once | INTRAVENOUS | Status: AC
  Administered 2017-10-09: 2 g via INTRAVENOUS
  Filled 2017-10-09: qty 50

## 2017-10-09 MED ORDER — RIVAROXABAN 20 MG PO TABS
20.0000 mg | ORAL_TABLET | Freq: Every day | ORAL | Status: DC
  Filled 2017-10-09: qty 1

## 2017-10-09 MED ORDER — FUROSEMIDE 40 MG PO TABS
40.0000 mg | ORAL_TABLET | Freq: Every day | ORAL | Status: DC
  Administered 2017-10-09 – 2017-10-10 (×2): 40 mg via ORAL
  Filled 2017-10-09 (×2): qty 1

## 2017-10-09 NOTE — Progress Notes (Signed)
Pt refused to allow nurse tech to complete am EKG

## 2017-10-09 NOTE — Telephone Encounter (Signed)
R/S appointment for 6/18 per sch msg.  Spoke with patient daughter and mailed a calender with a letter enclosed

## 2017-10-09 NOTE — Progress Notes (Signed)
I/O is inaccurate this shift. Dedicated suction was not working, and also, pt was incontinent during ECHO.

## 2017-10-09 NOTE — Consult Note (Addendum)
Cardiology Consultation:   Patient ID: [name]; [id number]; 1934/12/23   Admit date: 10/08/2017 Date of Consult: 10/09/2017  Primary Care Provider: Gildardo Cranker, DO Primary Cardiologist: Dr. Meda Coffee   Patient Profile:   [name] is a 83 y.o. female with a hx of CAD s/p BMS to RCA in 2004 & DES to RCA in 06/2010, morbid obesity, severe OA - basically confined to a wheelchair, COPD, DM, HTN, HLD, incontinence, OSA, polymyalgia rheumatica, anxiety, depression and gallbladder disease and chronic anticoagulation for PE/DVT  who is being seen today for the evaluation of chest pain and SOB at the request of Dr. Erlinda Hong.   Last seen in 2016 for pre-op evaluation of breast cancer. High risk test test. Follow up cath 06/29/2014 showed normal global LV function with mild mid inferior hypocontractility. There was evidence for coronary calcification with smooth 20% proximal narrowing followed by 30% stenosis before the first diagonal branch, 50-60% stenosis in the proximal diagonal 1 vessel, 30% mid LAD smooth narrowing, and 20% smooth proximal left circumflex stenosis. Her RCA stents were widely patent.  Last echo 07/2017 showed severe LVH, LVEF of 60-65%, unable to evaluated LV diastolic dysfunction.   History of Present Illness:   [name] has baseline demantia. Hx is difficult to obtained. Come with "4 hours hx of shortness of breath" however h&P indicates 3-4 months.  Wheelchair-bound.  Has orthopnea, lower extremity edema and PND.  Mild intermittent chest pain with cough.  She does eat for high in salt.  BNP 104 on admission.  Chest x-ray showed underlying chronic bronchitis changes and low-grade CHF.  She was started on IV Lasix and diuresed 1.6 L so far.  Breathing has improved.  Troponin has been remained negative.  Mild leukocytosis with anemia.  Past Medical History:  Diagnosis Date  . Allergy    takes Mucinex daily as needed  . Anemia, unspecified   . Anxiety    takes Clonazepam  daily as needed  . Arthritis   . CHF (congestive heart failure) (HCC)    takes Furosemide daily  . Coronary artery disease    a. s/p IMI 2004 tx with BMS to RCA;  b. s/p Promus DES to RCA 2/12 (cath: LM 20-30%, pLAD 20-30%, mLAD 50%, RI 30%, pRCA 60%, mRCA 90% - tx with PCI);  c. myoview 1/07: EF 49%, inf scar, no isch  . Coronary atherosclerosis of native coronary artery   . Depression    takes Cymbalta daily  . Diabetes mellitus    insulin daily  . Dysuria   . GERD (gastroesophageal reflux disease)    takes Protonix daily  . Gout, unspecified    takes Colchicine daily  . Headache    occasionally  . History of blood clots about 41yrs ago   in legs-takes Coumadin   . Hyperlipidemia    takes Pravastatin daily  . Hypertension    takes Imdur and Metoprolol daily  . Insomnia   . Joint pain   . Joint swelling   . Myocardial infarction ([place]) 2004  . Numbness   . Obstructive sleep apnea   . Osteoarthritis   . Osteoarthritis   . Osteoarthrosis, unspecified whether generalized or localized, lower leg   . Pain, chronic   . Polymyalgia rheumatica ([place])   . Pulmonary emboli ([place]) 9/13   felt to need lifelong anticoagulation  . Shortness of breath dyspnea    with exertion and has Albuterol inhaler prn  . Type II or unspecified type diabetes mellitus  without mention of complication, uncontrolled   . Urinary incontinence    takes Linzess daily  . Vertigo    hx of;was taking Meclizine if needed    Past Surgical History:  Procedure Laterality Date  . ABDOMINAL HYSTERECTOMY     partial  . APPENDECTOMY    . blood clots/legs and lungs  2013  . BREAST BIOPSY Left 07/22/2014  . BREAST BIOPSY Left 02/10/2013  . BREAST LUMPECTOMY Left 11/05/2014  . BREAST LUMPECTOMY WITH RADIOACTIVE SEED LOCALIZATION Left 11/05/2014   Procedure: LEFT BREAST LUMPECTOMY WITH RADIOACTIVE SEED LOCALIZATION;  Surgeon: [name], MD;  Location: [facility];  Service: General;  Laterality: Left;  . CARDIAC  CATHETERIZATION    . COLONOSCOPY    . CORONARY ANGIOPLASTY  2  . ESOPHAGOGASTRODUODENOSCOPY (EGD) WITH PROPOFOL N/A 11/07/2016   Procedure: ESOPHAGOGASTRODUODENOSCOPY (EGD) WITH PROPOFOL;  Surgeon: Gatha Mayer, MD;  Location: [facility];  Service: Endoscopy;  Laterality: N/A;  . EXCISION OF SKIN TAG Right 11/05/2014   Procedure: EXCISION OF RIGHT EYELID SKIN TAG;  Surgeon: [name], MD;  Location: [facility] [place];  Service: General;  Laterality: Right;  . EYE SURGERY Bilateral    cataract   . GASTRIC BYPASS  1977    reversed in 1979, [place] N/A 06/29/2014   Procedure: LEFT HEART CATHETERIZATION WITH CORONARY ANGIOGRAM;  Surgeon: Troy Sine, MD;  Location: [facility] CATH LAB;  Service: Cardiovascular;  Laterality: N/A;  . MI with stent placement  2004   Inpatient Medications: Scheduled Meds: . anastrozole  1 mg Oral Daily  . clonazePAM  1 mg Oral QHS  . DULoxetine  60 mg Oral Daily  . furosemide  40 mg Intravenous Q12H  . gabapentin  100 mg Oral QHS  . hydrALAZINE  10 mg Oral Q8H  . insulin aspart  0-5 Units Subcutaneous QHS  . insulin aspart  0-9 Units Subcutaneous TID WC  . insulin glargine  35 Units Subcutaneous BID  . ipratropium-albuterol  3 mL Nebulization TID  . isosorbide mononitrate  60 mg Oral Daily  . loratadine  10 mg Oral Daily  . memantine  10 mg Oral Daily  . memantine  5 mg Oral QHS  . metoprolol succinate  25 mg Oral Daily  . montelukast  10 mg Oral QHS  . pantoprazole  40 mg Oral Daily  . pravastatin  20 mg Oral q1800  . rivaroxaban  10 mg Oral Daily  . sodium chloride flush  3 mL Intravenous Q12H   Continuous Infusions: . sodium chloride     PRN Meds: sodium chloride, acetaminophen **OR** acetaminophen, ondansetron **OR** ondansetron (ZOFRAN) IV, oxyCODONE-acetaminophen **AND** oxyCODONE, sodium chloride flush  Allergies:    Allergies  Allergen Reactions  . Sulfonamide Derivatives Swelling     Mouth swelling  . Tramadol Nausea And Vomiting  . Aricept [Donepezil Hcl]     GI upset/loose stools    Social History:   Social History   Socioeconomic History  . Marital status: Widowed    Spouse name: Not on file  . Number of children: Not on file  . Years of education: Not on file  . Highest education level: Not on file  Occupational History  . Occupation: retired  Scientific laboratory technician  . Financial resource strain: Not hard at all  . Food insecurity:    Worry: Never true    Inability: Never true  . Transportation needs:    Medical: Yes    Non-medical:  Yes  Tobacco Use  . Smoking status: Never Smoker  . Smokeless tobacco: Never Used  Substance and Sexual Activity  . Alcohol use: No    Alcohol/week: 0.0 oz  . Drug use: No  . Sexual activity: Not Currently  Lifestyle  . Physical activity:    Days per week: 0 days    Minutes per session: 0 min  . Stress: Rather much  Relationships  . Social connections:    Talks on phone: Once a week    Gets together: Three times a week    Attends religious service: 1 to 4 times per year    Active member of club or organization: Yes    Attends meetings of clubs or organizations: 1 to 4 times per year    Relationship status: Widowed  . Intimate partner violence:    Fear of current or ex partner: No    Emotionally abused: No    Physically abused: No    Forced sexual activity: No  Other Topics Concern  . Not on file  Social History Narrative  . Not on file    Family History:   Family History  Problem Relation Age of Onset  . Breast cancer Mother 27  . Heart disease Mother   . Throat cancer Father   . Hypertension Father   . Arthritis Father   . Diabetes Father   . Arthritis Sister   . Obesity Sister   . Diabetes Sister   . Heart disease Cousin   . Colon cancer Neg Hx   . Stomach cancer Neg Hx   . Esophageal cancer Neg Hx      ROS:  Please see the history of present illness.  All other ROS reviewed and negative.      Physical Exam/Data:   Vitals:   10/09/17 0001 10/09/17 0007 10/09/17 0309 10/09/17 0800  BP:  (!) 180/72 (!) 159/74 (!) 155/88  Pulse:  86 87 89  Resp:  15 11   Temp:  99.1 F (37.3 C) 98.9 F (37.2 C) 98.9 F (37.2 C)  TempSrc:  Oral Oral Axillary  SpO2:  90% 96% 97%  Weight: (!) 341 lb 7.9 oz (154.9 kg)  (!) 340 lb (154.2 kg)   Height: 5\' 7"  (1.702 m)       Intake/Output Summary (Last 24 hours) at 10/09/2017 0804 Last data filed at 10/09/2017 0325 Gross per 24 hour  Intake -  Output 1600 ml  Net -1600 ml   Filed Weights   10/08/17 1245 10/09/17 0001 10/09/17 0309  Weight: (!) 301 lb (136.5 kg) (!) 341 lb 7.9 oz (154.9 kg) (!) 340 lb (154.2 kg)   Body mass index is 53.25 kg/m.  General: Obese female in no acute distress, chronically ill-appearing HEENT: normal Lymph: no adenopathy Neck: no JVD Endocrine:  No thryomegaly Vascular: No carotid bruits; FA pulses 2+ bilaterally without bruits  Cardiac:  normal S1, S2; RRR; no murmur  Lungs: Faint crackles at base bilaterally  abd: soft, nontender, no hepatomegaly  Ext: 1-2+ bilateral lower extremity edema Musculoskeletal:  No deformities, BUE and BLE strength normal and equal Skin: warm and dry  Neuro:  CNs 2-12 intact, no focal abnormalities noted Psych:  Normal affect   EKG:  The EKG was personally reviewed and demonstrates: Sinus rhythm at rate of 87 bpm   Relevant CV Studies: Echo 07/27/2017 Study Conclusions  - Left ventricle: The cavity size was normal. Wall thickness was   increased in a pattern of severe  LVH. Systolic function was   normal. The estimated ejection fraction was in the range of 60%   to 65%. There is akinesis of the basalinferior myocardium. The   study is not technically sufficient to allow evaluation of LV   diastolic function. - Mitral valve: Severely calcified annulus.  Cath 06/29/2014 ANGIOGRAPHY:   Fluoroscopy revealed coronary calcification of left coronary system and  RCA.  The left main coronary artery was a moderate-sized long vessel which  wasangiographically normal and bifurcated into the LAD and left circumflex coronary artery.   The LAD had proximal calcification.  There was smooth proximal narrowing before the first septal perforating artery.  There was 30% narrowing proximal to the takeoff of the first agonal vessel with 50-60% narrowing in the proximal portion of this diagonal and 30% narrowing in the LAD beyond the diagonal vessel.  The left circumflex coronary artery was a moderate size vessel that had smooth 20% proximal narrowing prior to giving off one major bifurcating marginal branch.  The RCA was large caliber vessel.  There was a patent stent in the proximal RCA and a patent stent in the mid RCA.  The remainder of the RCA was free of significant disease.  The vessel supplied a moderate size PDA and smaller PLA vessel.  Left ventriculography revealed normal global LV contractility with mild mid inferior hypocontractility. There was no evidence for mitral regurgitation.  Ejection fraction is 55-60%.  IMPRESSION:  Normal global LV function with mild mid inferior hypocontractility.  Coronary calcification with smooth 20% proximal narrowing followed by 30% stenosis before the first diagonal branch, 50-60% stenosis in the proximal diagonal 1 vessel with 30% mid LAD smooth narrowing; 20% smooth stenosis in the proximal left circumflex: Artery, and widely patent proximal and mid RCA stents without significant RCA stenoses.  RECOMMENDATION:  Medical therapy.  Laboratory Data:  Chemistry Recent Labs  Lab 10/08/17 1302  NA 138  K 4.4  CL 103  CO2 28  GLUCOSE 230*  BUN 22*  CREATININE 1.26*  CALCIUM 8.9  GFRNONAA 39*  GFRAA 45*  ANIONGAP 7    Hematology Recent Labs  Lab 10/08/17 1302 10/09/17 0715  WBC 11.0* 13.1*  RBC 3.38* 3.54*  3.54*  HGB 8.3* 8.7*  HCT 28.0* 29.0*  MCV 82.8 81.9  MCH 24.6* 24.6*  MCHC 29.6*  30.0  RDW 14.5 14.6  PLT 331 337   Cardiac Enzymes Recent Labs  Lab 10/08/17 1935 10/09/17 0049  TROPONINI <0.03 <0.03    Recent Labs  Lab 10/08/17 1314  TROPIPOC 0.02    BNP Recent Labs  Lab 10/08/17 1302  BNP 104.4*    Radiology/Studies:  Dg Chest 2 View  Result Date: 10/08/2017 CLINICAL DATA:  Chest pressure and shortness of breath for the past day. History of COPD, CHF, previous MI, nonsmoker. EXAM: CHEST - 2 VIEW COMPARISON:  PA and lateral chest x-ray of July 25, 2017 FINDINGS: The lungs are borderline hypoinflated. The right hemidiaphragm is slightly higher than the left. The interstitial markings are coarse and slightly more conspicuous overall today. The cardiac silhouette is mildly enlarged. The pulmonary vascularity is not engorged. There is calcification in the wall of the aortic arch. There is multilevel degenerative disc disease of the thoracic spine. IMPRESSION: Mild interstitial prominence may reflect low-grade CHF. There are underlying chronic bronchitic changes. No alveolar pneumonia. Thoracic aortic atherosclerosis. Electronically Signed   By: David  Martinique M.D.   On: 10/08/2017 13:42   Assessment and Plan:   1. Acute  presumed diastolic heart failure -Wheelchair-bound.  Volume overload by exam and symptoms.  Likely exacerbated by excess salt intake.  She has underlying disease that is contributing to her edema like morbid obesity, wheelchair-bound or venous stasis. -Continue IV diuresis.  Net I/O -1.6 L so far.  Breathing has been improving.  Follow renal function closely.  Pending echocardiogram. -Continue metoprolol, hydralazine and Imdur.  No ACE or ARB due to CKD.  2.  Chest pain with history of CAD and prior stenting -History unreliable.  Seems due to CHF exacerbation.  EKG without acute ischemic changes.  Troponin negative.  No further ischemic evaluation needed. -Continue beta-blocker and statin.  Not on aspirin due to need of anticoagulation.  3.   Hypertension -Blood pressure elevated on presentation.  Questionable compliance.  Management per by primary team.    For questions or updates, please contact [place] Please consult [email or web address] for contact info under Cardiology/STEMI.   Mahalia Longest [place], [place]  10/09/2017 8:04 AM   Attending Note:   The patient was seen and examined.  Agree with assessment and plan as noted above.  Changes made to the above note as needed.  Patient seen and independently examined with Vin bhagat, PA .   We discussed all aspects of the encounter. I agree with the assessment and plan as stated above.  1.   Shortness of breath : The patient has multiple reasons for her shortness of breath.  She is morbidly obese.  She has severe LVH and certainly has some degree of diastolic dysfunction, although it is difficult to quantitate.  BNP is 104 ( normal )   She does not walk much at all,   Spends much of the day in her wheelchair. Change lasix to PO.   Would  give her lasix to take PRN at time of discharge.   Follow up with Dr. Meda Coffee   2.  Leg edema:   Due to dependent edema - spends most of the day in the wheelchair.   Does not ambulate to any significant degree.  Rec:   Leg elevation, compression hose.  Avoid salty foods.  3.   CAD:  No angina.   I would not pursue any further work up for ischemia.   Would consider cath if she has severe CP or other evidence of ischem;ia that we cannot treat medically   4.  Hx of DVT - continue  xarelto   No active cardiac issues Will sign off.  Please call for questions   I have spent a total of 40 minutes with patient reviewing hospital  notes , telemetry, EKGs, labs and examining patient as well as establishing an assessment and plan that was discussed with the patient. > 50% of time was spent in direct patient care.     Thayer Headings, Brooke Bonito., MD, [facility] 10/09/2017, 8:33 AM [address],  [place] Pager [phone]

## 2017-10-09 NOTE — Evaluation (Signed)
Physical Therapy Evaluation Patient Details Name: [name] MRN: [id number] DOB: [birth year] Today's Date: 10/09/2017   History of Present Illness  [name] is a 83 y.o. female with a hx of CAD s/p BMS to RCA in 2004 & DES to RCA in 06/2010, morbid obesity, severe OA - basically confined to a wheelchair, COPD, DM, HTN, HLD, incontinence, OSA, polymyalgia rheumatica, anxiety, depression and gallbladder disease and chronic anticoagulation for PE/DVT  who was admitted for the evaluation of chest pain and SOB at the request of Dr. Erlinda Hong.   Clinical Impression  Pt admitted with above diagnosis. Pt currently with functional limitations due to the deficits listed below (see PT Problem List). Pt was able to get to chair with min assist and cues.  Pt appears to be at baseline with transfers.  Will assess ambulation at next visit as pt primarily performs transfers to chair.  Pt should progress well.   Pt will benefit from skilled PT to increase their independence and safety with mobility to allow discharge to the venue listed below.      Follow Up Recommendations Home health PT;Supervision/Assistance - 24 hour    Equipment Recommendations  None recommended by PT    Recommendations for Other Services       Precautions / Restrictions Precautions Precautions: Fall Restrictions Weight Bearing Restrictions: No      Mobility  Bed Mobility Overal bed mobility: Needs Assistance Bed Mobility: Supine to Sit;Sit to Supine     Supine to sit: Min assist Sit to supine: Min assist   General bed mobility comments: Pt needed assist to move LEs to EOB and OOB and to move LEs back onto bed.  Pt also needed assist to elevate trunk but did not need assist with trunk lying down.  Noted that purewick catheter was kinked and pt had urinated in bed and all linens were soaked including gown. Determined that it was best to get OOB to clean up and change bed and then assist pt back to bed. Total assist for  cleaning LB and perineal area as well as buttocks but pt did clean her UB.    Transfers Overall transfer level: Needs assistance Equipment used: 1 person hand held assist;None Transfers: Sit to/from [facility] Sit to Stand: Min assist Stand pivot transfers: Min assist       General transfer comment: Pt was able to stand pivot with min assist to wheelchair and then back.  Used rail of wheelchair vs at times holding onto therapist.  Overall safe movement with pt widening BOS and flexing quite a bit at trunk to transfer.    Ambulation/Gait                Stairs            Wheelchair Mobility    Modified Rankin (Stroke Patients Only)       Balance Overall balance assessment: Needs assistance Sitting-balance support: No upper extremity supported;Feet supported Sitting balance-Leahy Scale: Fair     Standing balance support: Bilateral upper extremity supported;During functional activity Standing balance-Leahy Scale: Poor Standing balance comment: relies on UE support for balance                             Pertinent Vitals/Pain Pain Assessment: No/denies pain    Home Living Family/patient expects to be discharged to:: Private residence Living Arrangements: Children Available Help at Discharge: Family;Available 24 hours/day Type of Home: House  Home Access: Level entry     Home Layout: Two level;Able to live on main level with bedroom/bathroom Home Equipment: Walker - 4 wheels;Wheelchair - manual;Shower seat - built in;Bedside commode      Prior Function Level of Independence: Needs assistance   Gait / Transfers Assistance Needed: assist to get in and out of bed;  amb with rollator short distances, bed to bathroom, uses w/c  "from bed to car"  ADL's / Homemaking Assistance Needed: assist with bathing, dressing--dtr assists        Hand Dominance   Dominant Hand: Right    Extremity/Trunk Assessment   Upper Extremity  Assessment Upper Extremity Assessment: Defer to OT evaluation    Lower Extremity Assessment Lower Extremity Assessment: Generalized weakness    Cervical / Trunk Assessment Cervical / Trunk Assessment: Kyphotic  Communication   Communication: No difficulties  Cognition Arousal/Alertness: Awake/alert Behavior During Therapy: WFL for tasks assessed/performed Overall Cognitive Status: Within Functional Limits for tasks assessed                                        General Comments      Exercises     Assessment/Plan    PT Assessment Patient needs continued PT services  PT Problem List Decreased mobility;Decreased balance;Decreased activity tolerance;Decreased knowledge of use of DME;Decreased safety awareness;Decreased knowledge of precautions       PT Treatment Interventions DME instruction;Gait training;Functional mobility training;Therapeutic activities;Therapeutic exercise;Balance training;Patient/family education    PT Goals (Current goals can be found in the Care Plan section)  Acute Rehab PT Goals Patient Stated Goal: to get better PT Goal Formulation: With patient Time For Goal Achievement: 10/23/17 Potential to Achieve Goals: Good    Frequency Min 3X/week   Barriers to discharge        Co-evaluation               AM-PAC PT "6 Clicks" Daily Activity  Outcome Measure Difficulty turning over in bed (including adjusting bedclothes, sheets and blankets)?: None Difficulty moving from lying on back to sitting on the side of the bed? : A Lot Difficulty sitting down on and standing up from a chair with arms (e.g., wheelchair, bedside commode, etc,.)?: A Little Help needed moving to and from a bed to chair (including a wheelchair)?: A Little Help needed walking in hospital room?: Total Help needed climbing 3-5 steps with a railing? : Total 6 Click Score: 14    End of Session Equipment Utilized During Treatment: Gait belt Activity  Tolerance: Patient limited by fatigue Patient left: in bed;with call bell/phone within reach(Transporter taking pt to Echo at end of session) Nurse Communication: Mobility status PT Visit Diagnosis: Muscle weakness (generalized) (M62.81)    Time: 3557-3220 PT Time Calculation (min) (ACUTE ONLY): 31 min   Charges:   PT Evaluation $PT Eval Moderate Complexity: 1 Mod PT Treatments $Therapeutic Activity: 8-22 mins   PT G Codes:        Bartley Vuolo,PT Acute Rehabilitation [phone] (pager)   Denice Paradise 10/09/2017, 11:10 AM

## 2017-10-09 NOTE — Consult Note (Signed)
   [facility] CM Inpatient Consult   10/09/2017  [name] 01-04-1935 [id number]  Alerted of the patient's hospitalization.  Patient is currently active with [facility] for chronic disease management services.  Patient has been engaged by a Psychologist, clinical, and Pharmacist/assistant.  Our community based plan of care has focused on disease management and community resource support.  Patient will receive a post discharge transition of care call and will be evaluated for monthly home visits for assessments and disease process education.  Met with the patient and her daughter, [name].  Patient gave permission to speak in front of daughter and family.  Daughter states the mother had home health care.  She indicates that her sister [name] lives with the patient and has the information needed in regards to her home health care.  [name], feels that the home health could have done more. Family requested information to share with [name] present at this time].  Chart reviewed.  Admitted with HF exacerbation. Will follow up for disposition and needs.   Made Inpatient Case Manager aware that [facility] following. Of note, [facility] Care Management services does not replace or interfere with any services that are needed or arranged by inpatient case management or social work.  For additional questions or referrals please contact:  Nichole Brood, RN BSN [facility] Liaison  [phone] business mobile phone Toll free office [phone]

## 2017-10-09 NOTE — Progress Notes (Signed)
  Echocardiogram 2D Echocardiogram has been performed.  [name] 10/09/2017, 10:50 AM

## 2017-10-09 NOTE — Patient Outreach (Signed)
[facility]) Care Management  10/09/2017  LASHONE STAUBER 1934/07/07 [id number]   RN Health Coach Hospitalization  Referral Date: 08/01/2017 Referral Source: EMMI General Discharge Screening Reason for Referral: Disease Management Education Insurance: [employer]   Outreach Attempt:  Received notification patient admitted to [facility] for acute CHF exacerbation.  Treating with IV lasix.  In basket message sent to [facility] Liaison to follow up with patient and family to assess patient needs on discharge.  Plan:  RN Health Coach will await plan and recommendations from [facility].  [place] [phone] [email or web address]

## 2017-10-09 NOTE — Progress Notes (Signed)
PROGRESS NOTE  [name] LOV:[id number] DOB: 12-Jul-1934 DOA: 10/08/2017 PCP: Gildardo Cranker, DO  HPI/Recap of past 24 hours:  Feeling better, currently no chest pain, no fever, she is on room air, denies sob at rest She reports leg edema has resolved She still has intermittent productive cough Daughter at bedside  Assessment/Plan: Active Problems:   ANEMIA   Essential hypertension   CAD (coronary artery disease)   COPD (chronic obstructive pulmonary disease) (HCC)   CKD (chronic kidney disease) stage 3, GFR 30-59 ml/min (HCC)   Dyslipidemia associated with type 2 diabetes mellitus ([place])   Uncontrolled type 2 diabetes mellitus with diabetic nephropathy, with long-term current use of insulin (HCC)   Chronic pain syndrome   Breast cancer (HCC)   GERD with esophagitis   Acute on chronic diastolic CHF (congestive heart failure) (HCC)   CHF (congestive heart failure) (HCC)  Acute on chronic diastolic chf -she presented with sob, edema, chest pain -no hypoxia, troponin negative x3, no acute ekg changes -cxr " Mild interstitial prominence may reflect low-grade CHF. There are underlying chronic bronchitic changes. No alveolar pneumonia. Thoracic aortic atherosclerosis." -she received iv lasix on presentation, edema has resolved, she is on room air -cardiology consulted, input appreciated, patient was not on lasix prior to this hospitalization, will need to be on lasix po daily prn for edema at discharge.  Hypomagnesemia: replace mag   H/o DVT on xarelto  IDDM2 a1c 9.8 Continue insulin, adjust prn  HTN continue lopressor,  imdur, hydralazine  CKDIII Cr appear at baseline Renal dosing meds  Anemia of chronic disease Hgb8.7 close to baseline Folate/b12/iron study unremarkable retic inappropriately low consistent with anemia of chronic disease    H/o breast cancer on anastrole, followed by oncology Dr Irene Limbo.  Dementia,memory impairment,  on namenda She is  aaox3, immediate recall 2/3.  Chronic pain: continue home meds prn percocet Peripheral neuropathy, start low dose neurontin  morbid obesity Body mass index is 53.25 kg/m.     FTT: has been mostly in bed, have falls at home.she declined snf She has been mostly wheelchair bound for more than a year Pt eval recommended home health    Code Status: DNR  Family Communication: patient   Disposition Plan: home with home health likely on 6/5   Consultants:  cardiology  Procedures:  none  Antibiotics:  none   Objective: BP (!) 155/88 (BP Location: Left Arm)   Pulse 89   Temp 98.9 F (37.2 C) (Axillary)   Resp 11   Ht 5\' 7"  (1.702 m)   Wt (!) 154.2 kg (340 lb)   SpO2 97%   BMI 53.25 kg/m   Intake/Output Summary (Last 24 hours) at 10/09/2017 0833 Last data filed at 10/09/2017 0325 Gross per 24 hour  Intake -  Output 1600 ml  Net -1600 ml   Filed Weights   10/08/17 1245 10/09/17 0001 10/09/17 0309  Weight: (!) 136.5 kg (301 lb) (!) 154.9 kg (341 lb 7.9 oz) (!) 154.2 kg (340 lb)    Exam: Patient is examined daily including today on 10/09/2017, exams remain the same as of yesterday except that has changed    General:  NAD, obese  Cardiovascular: RRR  Respiratory: CTABL  Abdomen: Soft/ND/NT, positive BS  Musculoskeletal: No Edema  Neuro: alert, oriented , impaired memory  Data Reviewed: Basic Metabolic Panel: Recent Labs  Lab 10/08/17 1302  NA 138  K 4.4  CL 103  CO2 28  GLUCOSE 230*  BUN 22*  CREATININE 1.26*  CALCIUM 8.9   Liver Function Tests: No results for input(s): AST, ALT, ALKPHOS, BILITOT, PROT, ALBUMIN in the last 168 hours. No results for input(s): LIPASE, AMYLASE in the last 168 hours. No results for input(s): AMMONIA in the last 168 hours. CBC: Recent Labs  Lab 10/08/17 1302 10/09/17 0715  WBC 11.0* 13.1*  HGB 8.3* 8.7*  HCT 28.0* 29.0*  MCV 82.8 81.9  PLT 331 337   Cardiac Enzymes:   Recent Labs  Lab  10/08/17 1935 10/09/17 0049  TROPONINI <0.03 <0.03   BNP (last 3 results) Recent Labs    10/08/17 1302  BNP 104.4*    ProBNP (last 3 results) No results for input(s): PROBNP in the last 8760 hours.  CBG: Recent Labs  Lab 10/09/17 0000 10/09/17 0727  GLUCAP 244* 197*    No results found for this or any previous visit (from the past 240 hour(s)).   Studies: Dg Chest 2 View  Result Date: 10/08/2017 CLINICAL DATA:  Chest pressure and shortness of breath for the past day. History of COPD, CHF, previous MI, nonsmoker. EXAM: CHEST - 2 VIEW COMPARISON:  PA and lateral chest x-ray of July 25, 2017 FINDINGS: The lungs are borderline hypoinflated. The right hemidiaphragm is slightly higher than the left. The interstitial markings are coarse and slightly more conspicuous overall today. The cardiac silhouette is mildly enlarged. The pulmonary vascularity is not engorged. There is calcification in the wall of the aortic arch. There is multilevel degenerative disc disease of the thoracic spine. IMPRESSION: Mild interstitial prominence may reflect low-grade CHF. There are underlying chronic bronchitic changes. No alveolar pneumonia. Thoracic aortic atherosclerosis. Electronically Signed   By: David  Martinique M.D.   On: 10/08/2017 13:42    Scheduled Meds: . anastrozole  1 mg Oral Daily  . clonazePAM  1 mg Oral QHS  . DULoxetine  60 mg Oral Daily  . furosemide  40 mg Intravenous Q12H  . gabapentin  100 mg Oral QHS  . hydrALAZINE  10 mg Oral Q8H  . insulin aspart  0-5 Units Subcutaneous QHS  . insulin aspart  0-9 Units Subcutaneous TID WC  . insulin glargine  35 Units Subcutaneous BID  . ipratropium-albuterol  3 mL Nebulization TID  . isosorbide mononitrate  60 mg Oral Daily  . loratadine  10 mg Oral Daily  . memantine  10 mg Oral Daily  . memantine  5 mg Oral QHS  . metoprolol succinate  25 mg Oral Daily  . montelukast  10 mg Oral QHS  . pantoprazole  40 mg Oral Daily  . pravastatin  20  mg Oral q1800  . rivaroxaban  10 mg Oral Daily  . sodium chloride flush  3 mL Intravenous Q12H    Continuous Infusions: . sodium chloride       Time spent: 25 mins I have personally reviewed and interpreted on  10/09/2017 daily labs, tele strips, imagings as discussed above under date review session and assessment and plans.  I reviewed all nursing notes, pharmacy notes, consultant notes,  vitals, pertinent old records  I have discussed plan of care as described above with RN , patient and family on 10/09/2017   Florencia Reasons MD, PhD  [facility] Pager [phone]. If 7PM-7AM, please contact night-coverage at [email or web address], password [facility] 10/09/2017, 8:33 AM  LOS: 1 day

## 2017-10-09 NOTE — Evaluation (Signed)
Occupational Therapy Evaluation and Discharge Patient Details Name: [name] MRN: [id number] DOB: [birth year] Today's Date: 10/09/2017    History of Present Illness [name] is a 83 y.o. female with a hx of CAD s/p BMS to RCA in 2004 & DES to RCA in 06/2010, morbid obesity, severe OA - basically confined to a wheelchair, COPD, DM, HTN, HLD, incontinence, OSA, polymyalgia rheumatica, anxiety, depression and gallbladder disease and chronic anticoagulation for PE/DVT  who was admitted for the evaluation of chest pain and SOB at the request of Dr. Erlinda Hong.    Clinical Impression   Pt reports daughter assists with all ADL and transfers at home PTA. Currently pt max assist overall for ADL and min assist for stand pivot transfers. Pt reports she feels at or close to her functional baseline. Pt planning to d/c home with 24/7 supervision from family. No further acute OT needs identified; signing off at this time. Please re-consult if needs change. Thank you for this referral.    Follow Up Recommendations  No OT follow up;Supervision/Assistance - 24 hour    Equipment Recommendations  None recommended by OT    Recommendations for Other Services       Precautions / Restrictions Precautions Precautions: Fall Restrictions Weight Bearing Restrictions: No      Mobility Bed Mobility Overal bed mobility: Needs Assistance Bed Mobility: Supine to Sit     Supine to sit: Min assist;HOB elevated    General bed mobility comments: Min HHA for trunk elevation to sitting, pt able to manage LEs to EOB. HOB elevated with use of bed rail.  Transfers Overall transfer level: Needs assistance Equipment used: 1 person hand held assist Transfers: Sit to/from [facility] Sit to Stand: Min assist Stand pivot transfers: Min assist       General transfer comment: Min assist to boost up from EOB with HHA to pivot to chair. Pt reports this is how her and her daughter perform transfers at  home.    Balance Overall balance assessment: Needs assistance Sitting-balance support: Feet supported Sitting balance-Leahy Scale: Fair     Standing balance support: Bilateral upper extremity supported Standing balance-Leahy Scale: Poor Standing balance comment: relies on UE support for balance                           ADL either performed or assessed with clinical judgement   ADL Overall ADL's : Needs assistance/impaired;At baseline Eating/Feeding: Set up;Sitting   Grooming: Set up;Supervision/safety;Sitting   Upper Body Bathing: Moderate assistance;Sitting   Lower Body Bathing: Maximal assistance;Sit to/from stand   Upper Body Dressing : Moderate assistance;Sitting   Lower Body Dressing: Maximal assistance;Sit to/from stand   Toilet Transfer: Minimal assistance;Stand-pivot Armed forces technical officer Details (indicate cue type and reason): Simulated by transfer EOB to chair. HHA for support   Toileting - Clothing Manipulation Details (indicate cue type and reason): Reports daughter assists with peri care at home     Functional mobility during ADLs: Minimal assistance(HHA for stand pivot transfer) General ADL Comments: Pt reports she currently feels at her functional baseline; feels safe to return home with daughter assist     Vision         Perception     Praxis      Pertinent Vitals/Pain Pain Assessment: No/denies pain     Hand Dominance Right   Extremity/Trunk Assessment Upper Extremity Assessment Upper Extremity Assessment: Generalized weakness   Lower Extremity Assessment Lower Extremity Assessment:  Defer to PT evaluation   Cervical / Trunk Assessment Cervical / Trunk Assessment: Kyphotic   Communication Communication Communication: No difficulties   Cognition Arousal/Alertness: Awake/alert Behavior During Therapy: WFL for tasks assessed/performed Overall Cognitive Status: Within Functional Limits for tasks assessed                                      General Comments       Exercises     Shoulder Instructions      Home Living Family/patient expects to be discharged to:: Private residence Living Arrangements: Children Available Help at Discharge: Family;Available 24 hours/day Type of Home: House Home Access: Level entry     Home Layout: Two level;Able to live on main level with bedroom/bathroom     Bathroom Shower/Tub: Occupational psychologist: Handicapped height Bathroom Accessibility: Yes   Home Equipment: Environmental consultant - 4 wheels;Wheelchair - manual;Shower seat - built in;Bedside commode          Prior Functioning/Environment Level of Independence: Needs assistance  Gait / Transfers Assistance Needed: assist to get in and out of bed;  amb with rollator short distances, bed to bathroom, uses w/c  "from bed to car" ADL's / Homemaking Assistance Needed: assist with bathing, dressing--dtr assists            OT Problem List:        OT Treatment/Interventions:      OT Goals(Current goals can be found in the care plan section) Acute Rehab OT Goals Patient Stated Goal: to get better OT Goal Formulation: All assessment and education complete, DC therapy  OT Frequency:     Barriers to D/C:            Co-evaluation              AM-PAC PT "6 Clicks" Daily Activity     Outcome Measure Help from another person eating meals?: A Little Help from another person taking care of personal grooming?: A Little Help from another person toileting, which includes using toliet, bedpan, or urinal?: A Lot Help from another person bathing (including washing, rinsing, drying)?: A Lot Help from another person to put on and taking off regular upper body clothing?: A Lot Help from another person to put on and taking off regular lower body clothing?: A Lot 6 Click Score: 14   End of Session Nurse Communication: Mobility status  Activity Tolerance: Patient tolerated treatment well Patient left: in  chair;with call bell/phone within reach;with chair alarm set  OT Visit Diagnosis: Unsteadiness on feet (R26.81);Muscle weakness (generalized) (M62.81)                Time: 4259-5638 OT Time Calculation (min): 21 min Charges:  OT General Charges $OT Visit: 1 Visit OT Evaluation $OT Eval Moderate Complexity: 1 Mod G-Codes:     Nader Boys A. Ulice Brilliant, M.S., OTR/L Acute Rehab Department: [phone]  Binnie Kand 10/09/2017, 1:44 PM

## 2017-10-09 NOTE — Progress Notes (Signed)
Pharmacy note: Xarelto   82 yo female with history of PE/DVT on xarelto 10mg  po daily. She was on coumadin and per chart records, it appears this was changed to Xarelto10mg  po daily in 06/2017.  She is also noted with history of breast cancer and follows with Oncology as outpatient.  There is no recent history of bleeding and the patient also does not report any problems with taking Xarelto. Dosing for Xarelto for her indication is typically 20mg  po daily -hg= 8.7 (appears at baseline)  Plan -Spoke to Dr. Erlinda Hong: Will change xarelto to 20mg  po daily  -Spoke to the patient regarding the medication change  Hildred Laser, PharmD Clinical Pharmacist Clinical phone from 8:30-4:00 is [phone] After 4pm, please call Main Rx [phone]) for assistance. 10/09/2017 2:17 PM

## 2017-10-10 ENCOUNTER — Other Ambulatory Visit: Payer: Self-pay

## 2017-10-10 DIAGNOSIS — I509 Heart failure, unspecified: Secondary | ICD-10-CM

## 2017-10-10 DIAGNOSIS — I5033 Acute on chronic diastolic (congestive) heart failure: Secondary | ICD-10-CM

## 2017-10-10 LAB — PROCALCITONIN: Procalcitonin: 0.1 ng/mL

## 2017-10-10 LAB — BASIC METABOLIC PANEL
Anion gap: 5 (ref 5–15)
BUN: 25 mg/dL — ABNORMAL HIGH (ref 6–20)
CO2: 28 mmol/L (ref 22–32)
Calcium: 8.4 mg/dL — ABNORMAL LOW (ref 8.9–10.3)
Chloride: 101 mmol/L (ref 101–111)
Creatinine, Ser: 1.47 mg/dL — ABNORMAL HIGH (ref 0.44–1.00)
GFR calc Af Amer: 38 mL/min — ABNORMAL LOW (ref >60)
GFR calc non Af Amer: 32 mL/min — ABNORMAL LOW (ref >60)
Glucose, Bld: 273 mg/dL — ABNORMAL HIGH (ref 65–99)
Potassium: 4.1 mmol/L (ref 3.5–5.1)
Sodium: 134 mmol/L — ABNORMAL LOW (ref 135–145)

## 2017-10-10 LAB — CBC
HCT: 27.3 % — ABNORMAL LOW (ref 36.0–46.0)
Hemoglobin: 8.1 g/dL — ABNORMAL LOW (ref 12.0–15.0)
MCH: 24.5 pg — ABNORMAL LOW (ref 26.0–34.0)
MCHC: 29.7 g/dL — ABNORMAL LOW (ref 30.0–36.0)
MCV: 82.5 fL (ref 78.0–100.0)
Platelets: 324 10*3/uL (ref 150–400)
RBC: 3.31 MIL/uL — ABNORMAL LOW (ref 3.87–5.11)
RDW: 14.6 % (ref 11.5–15.5)
WBC: 12.1 10*3/uL — ABNORMAL HIGH (ref 4.0–10.5)

## 2017-10-10 LAB — ECHOCARDIOGRAM COMPLETE
Height: 67 in
Weight: 5440 oz

## 2017-10-10 LAB — MAGNESIUM: Magnesium: 1.5 mg/dL — ABNORMAL LOW (ref 1.7–2.4)

## 2017-10-10 LAB — GLUCOSE, CAPILLARY
Glucose-Capillary: 208 mg/dL — ABNORMAL HIGH (ref 65–99)
Glucose-Capillary: 284 mg/dL — ABNORMAL HIGH (ref 65–99)

## 2017-10-10 MED ORDER — BUDESONIDE-FORMOTEROL FUMARATE 160-4.5 MCG/ACT IN AERO: 2.0000 | INHALATION_SPRAY | Freq: Two times a day (BID) | RESPIRATORY_TRACT | 6 refills | Status: DC

## 2017-10-10 MED ORDER — HYDRALAZINE HCL 20 MG/ML IJ SOLN: 10.0000 mg | Freq: Four times a day (QID) | INTRAMUSCULAR | Status: DC | PRN

## 2017-10-10 MED ORDER — MAGNESIUM OXIDE 400 (241.3 MG) MG PO TABS: 400.0000 mg | ORAL_TABLET | Freq: Every day | ORAL | 0 refills | Status: AC

## 2017-10-10 MED ORDER — HYDRALAZINE HCL 25 MG PO TABS: 25.0000 mg | ORAL_TABLET | Freq: Three times a day (TID) | ORAL | 3 refills | Status: DC

## 2017-10-10 MED ORDER — HYDRALAZINE HCL 25 MG PO TABS: 25.0000 mg | ORAL_TABLET | Freq: Three times a day (TID) | ORAL | Status: DC

## 2017-10-10 MED ORDER — FUROSEMIDE 20 MG PO TABS: 20.0000 mg | ORAL_TABLET | Freq: Every day | ORAL | 3 refills | Status: DC | PRN

## 2017-10-10 MED ORDER — DEXTROSE 5 % IV SOLN
4.0000 g | Freq: Once | INTRAVENOUS | Status: AC
  Administered 2017-10-10: 4 g via INTRAVENOUS
  Filled 2017-10-10: qty 8

## 2017-10-10 NOTE — Discharge Summary (Signed)
Physician Discharge Summary   Patient ID: [name] MRN: [id number] DOB/AGE: 08/27/1934 83 y.o.  Admit date: 10/08/2017 Discharge date: 10/10/2017  Primary Care Physician:  Gildardo Cranker, DO   Recommendations for Outpatient Follow-up:  1. Follow up with PCP in 1-2 weeks  Home Health: Home health PT, OT, home health aide, RN to be arranged by case management Equipment/Devices: None  Discharge Condition: stable CODE STATUS: DNR   Diet recommendation: Carb modified   Discharge Diagnoses:   . Acute on chronic diastolic CHF (congestive heart failure) ([place]) . Breast cancer ([place]) . CAD (coronary artery disease) . Chronic pain syndrome . CKD (chronic kidney disease) stage 3, GFR 30-59 ml/min (HCC) . COPD (chronic obstructive pulmonary disease) ([place]) . Dyslipidemia associated with type 2 diabetes mellitus ([place]) . Essential hypertension . GERD with esophagitis . ANEMIA . Morbid obesity, BMI 53 Adult failure to thrive   Consults: Cardiology    Allergies:   Allergies  Allergen Reactions  . Sulfonamide Derivatives Swelling    Mouth swelling  . Tramadol Nausea And Vomiting  . Aricept [Donepezil Hcl]     GI upset/loose stools     DISCHARGE MEDICATIONS: Allergies as of 10/10/2017      Reactions   Sulfonamide Derivatives Swelling   Mouth swelling   Tramadol Nausea And Vomiting   Aricept [donepezil Hcl]    GI upset/loose stools      Medication List    STOP taking these medications   cephALEXin 500 MG capsule Commonly known as:  KEFLEX     TAKE these medications   AEROCHAMBER PLUS FLO-VU LARGE Misc 1 each by Other route once.   albuterol 108 (90 Base) MCG/ACT inhaler Commonly known as:  PROVENTIL HFA;VENTOLIN HFA Inhale 1-2 puffs into the lungs every 6 (six) hours as needed for wheezing or shortness of breath.   anastrozole 1 MG tablet Commonly known as:  ARIMIDEX Take 1 tablet (1 mg total) by mouth daily.   Biotin 10000 MCG Tabs Take 1 tablet by mouth  daily.   budesonide-formoterol 160-4.5 MCG/ACT inhaler Commonly known as:  SYMBICORT Inhale 2 puffs into the lungs 2 (two) times daily. For bronchitis   cetirizine 10 MG tablet Commonly known as:  ZYRTEC Take 10 mg by mouth at bedtime as needed for allergies.   clonazePAM 1 MG tablet Commonly known as:  KLONOPIN TAKE 2 TABLETS BY MOUTH AT BEDTIME FOR ANXIETY   docusate sodium 100 MG capsule Commonly known as:  COLACE Take 100 mg by mouth daily as needed for mild constipation.   DULoxetine 60 MG capsule Commonly known as:  CYMBALTA Take one capsule by mouth once daily for anxiety   estradiol 0.1 MG/GM vaginal cream Commonly known as:  ESTRACE Use every other evening use in vaginal area   febuxostat 40 MG tablet Commonly known as:  ULORIC Take 1 tablet (40 mg total) by mouth daily.   furosemide 20 MG tablet Commonly known as:  LASIX Take 1 tablet (20 mg total) by mouth daily as needed for edema (shortness of breath, swelling in the legs).   gabapentin 100 MG capsule Commonly known as:  NEURONTIN Take 100 mg by mouth at bedtime.   glucose blood test strip Commonly known as:  ONETOUCH VERIO USE 1 STRIP TO CHECK GLUCOSE THREE TIMES DAILY  E11.21   hydrALAZINE 25 MG tablet Commonly known as:  APRESOLINE Take 1 tablet (25 mg total) by mouth 3 (three) times daily. What changed:    medication strength  how much  to take  when to take this   hydrOXYzine 10 MG tablet Commonly known as:  ATARAX/VISTARIL Take 1 tablet (10 mg total) by mouth 3 (three) times daily as needed for itching.   insulin aspart 100 UNIT/ML FlexPen Commonly known as:  NOVOLOG FLEXPEN Before each meal 3 times a day, 140-199 - 2 units, 200-250 - 4 units, 251-299 - 6 units,  300-349 - 8 units,  350 or above 10 units. Insulin PEN if approved, provide syringes and needles if needed.   Insulin Glargine 300 UNIT/ML Sopn Commonly known as:  TOUJEO SOLOSTAR Inject 35 Units into the skin 2 (two) times  daily. (before breakfast and before supper)   isosorbide mononitrate 60 MG 24 hr tablet Commonly known as:  IMDUR TAKE 1 TABLET BY MOUTH EVERY DAY   Lancet Device Misc Onetouch verio machine lancets, check blood sugar twice daily as directed DX E11.69   magnesium oxide 400 (241.3 Mg) MG tablet Commonly known as:  MAG-OX Take 1 tablet (400 mg total) by mouth daily for 7 days.   meclizine 25 MG tablet Commonly known as:  ANTIVERT Take one tablet by mouth three times daily as needed for dizziness   memantine 5 MG tablet Commonly known as:  NAMENDA Take 5-10 mg by mouth See admin instructions. Take 2 tablets (10mg ) every AM and 1 tablet (5mg ) every evening.   metoprolol succinate 25 MG 24 hr tablet Commonly known as:  TOPROL-XL TAKE 1 TABLET BY MOUTH EVERY DAY FOR blood pressure   montelukast 10 MG tablet Commonly known as:  SINGULAIR Take 1 tablet (10 mg total) by mouth at bedtime. For seasonal allergy   nitroGLYCERIN 0.4 MG SL tablet Commonly known as:  NITROSTAT Take one tablet under the tongue every 5 minutes as needed for chest pain   oxyCODONE-acetaminophen 10-325 MG tablet Commonly known as:  PERCOCET Take 1 tablet by mouth every 6 (six) hours.   pantoprazole 40 MG tablet Commonly known as:  PROTONIX Take 1 tablet (40 mg total) by mouth daily.   pravastatin 20 MG tablet Commonly known as:  PRAVACHOL TAKE 1 TABLET BY MOUTH EVERY DAY FOR cholesterol   rivaroxaban 10 MG Tabs tablet Commonly known as:  XARELTO Take 1 tablet (10 mg total) by mouth daily.   sodium chloride 0.65 % Soln nasal spray Commonly known as:  OCEAN Place 1 spray into both nostrils 3 (three) times daily as needed (for sinus congestion.). RINSE AS NEEDED FOR SINUS CONGESTION   Vitamin D3 2000 units Tabs Take 2 tablets by mouth.        Brief H and P: For complete details please refer to admission H and P, but in brief patient is a 83 year old female with anemia, CHF, CAD, COPD depression,  diabetes, dementia, right lower extremity DVT and PE presented with shortness of breath, left-sided chest pain.  Reported lower extremity swelling for past 3 to 4 months, severe dyspnea with exertion, mild coughing with phlegm.  Known history of CAD, post MI in 2004.   Hospital Course:  Acute on chronic diastolic CHF -Presented with shortness of breath, peripheral edema and chest pain.  No hypoxia at the time of admission.  Troponin x3-.  No acute EKG changes. -Chest x-ray showed mild interstitial prominence may reflect low-grade CHF, underlying chronic bronchitic changes, no pneumonia. -Patient was placed on IV Lasix at the time of admission, edema has resolved, currently on room air. -Cardiology consult was obtained and recommended Lasix as needed for edema discharge.  Hypomagnesemia Replaced-   History of DVT Continue Xarelto-   History of diabetes mellitus, uncontrolled with hyperglycemia Hemoglobin A1c 9.8, continue glargine, sliding scale insulin Patient counseled strongly on glycemic control  Hypertension Continue Lopressor,imdur, hydralazine increased  Chronic kidney disease stage III Currently at baseline  Anemia of chronic disease Hemoglobin stable, close to baseline  History of breast cancer on anastrozole -Continue outpatient follow-up with Dr. Irene Limbo  Dementia Currently stable, no acute issues, continue Namenda   Morbid obesity BMI 53.25, counseled on diet and weight control  Adult failure to thrive History of falls at home, mostly wheelchair-bound, declined skilled nursing facility, PT evaluation recommended home health   Day of Discharge S: No acute complaints, no acute overnight issues.  No chest pain or shortness of breath.  BP (!) 186/82   Pulse 93   Temp 98.5 F (36.9 C) (Oral)   Resp 18   Ht 5\' 7"  (1.702 m)   Wt (!) 155.1 kg (341 lb 14.9 oz)   SpO2 94%   BMI 53.55 kg/m   Physical Exam: General: Alert and awake oriented x3 not in any acute  distress. HEENT: anicteric sclera, pupils reactive to light and accommodation CVS: S1-S2 clear no murmur rubs or gallops Chest: clear to auscultation bilaterally, no wheezing rales or rhonchi Abdomen: Morbidly obese, soft nontender, nondistended, normal bowel sounds Extremities: no cyanosis, clubbing or edema noted bilaterally Neuro: Cranial nerves II-XII intact, no focal neurological deficits   The results of significant diagnostics from this hospitalization (including imaging, microbiology, ancillary and laboratory) are listed below for reference.      Procedures/Studies:  Dg Chest 2 View  Result Date: 10/08/2017 CLINICAL DATA:  Chest pressure and shortness of breath for the past day. History of COPD, CHF, previous MI, nonsmoker. EXAM: CHEST - 2 VIEW COMPARISON:  PA and lateral chest x-ray of July 25, 2017 FINDINGS: The lungs are borderline hypoinflated. The right hemidiaphragm is slightly higher than the left. The interstitial markings are coarse and slightly more conspicuous overall today. The cardiac silhouette is mildly enlarged. The pulmonary vascularity is not engorged. There is calcification in the wall of the aortic arch. There is multilevel degenerative disc disease of the thoracic spine. IMPRESSION: Mild interstitial prominence may reflect low-grade CHF. There are underlying chronic bronchitic changes. No alveolar pneumonia. Thoracic aortic atherosclerosis. Electronically Signed   By: David  Martinique M.D.   On: 10/08/2017 13:42       LAB RESULTS: Basic Metabolic Panel: Recent Labs  Lab 10/09/17 0715 10/10/17 0448  NA 139 134*  K 3.8 4.1  CL 99* 101  CO2 29 28  GLUCOSE 214* 273*  BUN 22* 25*  CREATININE 1.23* 1.47*  CALCIUM 9.1 8.4*  MG 1.2* 1.5*  PHOS 3.7  --    Liver Function Tests: Recent Labs  Lab 10/09/17 0715  AST 12*  ALT 11*  ALKPHOS 76  BILITOT 0.5  PROT 8.0  ALBUMIN 2.8*   No results for input(s): LIPASE, AMYLASE in the last 168 hours. No results  for input(s): AMMONIA in the last 168 hours. CBC: Recent Labs  Lab 10/09/17 0715 10/10/17 0448  WBC 13.1* 12.1*  HGB 8.7* 8.1*  HCT 29.0* 27.3*  MCV 81.9 82.5  PLT 337 324   Cardiac Enzymes: Recent Labs  Lab 10/09/17 0049 10/09/17 0715  TROPONINI <0.03 <0.03   BNP: Invalid input(s): POCBNP CBG: Recent Labs  Lab 10/10/17 0741 10/10/17 1139  GLUCAP 208* 284*      Disposition and Follow-up: Discharge Instructions  Diet - low sodium heart healthy   Complete by:  As directed    Increase activity slowly   Complete by:  As directed        DISPOSITION: [place], [place], DO. Schedule an appointment as soon as possible for a visit in 2 week(s).   Specialty:  Internal Medicine Contact information: [place] [address] [phone]        Charlie Pitter, PA-C. Go on 11/15/2017.   Specialties:  Cardiology, Radiology Why:  @3pm  for hospital follow up with Dr. Francesca Oman PA Contact information: [name] [place] [address] [phone]            Time coordinating discharge:  40-minutes  Signed:   Estill Cotta M.D. [facility] 10/10/2017, 12:11 PM Pager: [phone]

## 2017-10-10 NOTE — Progress Notes (Signed)
Discharge instruction was given to pt and daughter. Belongings were sent home with pt.  Idolina Primer, RN

## 2017-10-10 NOTE — Care Management Note (Signed)
Case Management Note  Patient Details  Name: [name] MRN: [id number] Date of Birth: 16-Sep-1934  Subjective/Objective: Pt presented for CHF and Left Sided Chest Pain. PTA from home with support of daughter Starlett Pehrson. Pt was active with [facility]. Pt's family wants to use again.                      Action/Plan: CM did call [facility] to make them aware that pt will transition home today. No further needs from CM at this time.   Expected Discharge Date:  10/10/17               Expected Discharge Plan:  [place]  In-House Referral:  NA  Discharge planning Services  CM Consult  Post Acute Care Choice:  Home Health Choice offered to:  Patient, Adult Children  DME Arranged:  N/A DME Agency:  NA  HH Arranged:  PT, Nurse's Aide [place] Agency:  [facility] (formerly [employer])  Status of Service:  Completed, signed off  If discussed at [employer], dates discussed:    Additional Comments:  Bethena Roys, RN 10/10/2017, 12:34 PM

## 2017-10-11 ENCOUNTER — Other Ambulatory Visit: Payer: Self-pay | Admitting: *Deleted

## 2017-10-11 ENCOUNTER — Other Ambulatory Visit: Payer: Self-pay | Admitting: Pharmacy Technician

## 2017-10-11 NOTE — Patient Outreach (Signed)
[facility]) Care Management  10/11/2017  [name] 1935/03/01 [id number]  Successful outreach call to patients daughter, [name], HIPAA identifiers verified. [name] states that she has not "gotten around to filling the application out". I informed [name] that at this time she should outreach me when she is able to send them back in.  [name] is now having issues affording her Symbicort inhaler.  Will route to [place] for review.  Maud Deed [place], [place] Management [phone]

## 2017-10-11 NOTE — Patient Outreach (Signed)
[facility]) Care Management  10/11/2017  LOYD SALVADOR 07-10-1934 [id number]   RN Health Coach Discipline Closure  Referral Date: 08/01/2017 Referral Source: EMMI General Discharge Screening Reason for Referral: Disease Management Education Insurance: [employer]   Outreach Attempt:  Patient discharged from hospital on 10/10/2017.  [place] placed order for [place] Nurse to follow patient for Transition of Care Calls and possible home visits.  RN Health Coach will close discipline at this time.  Plan:  RN Health Coach will perform discipline closure at this time.  RN Health Coach to send primary care provider discipline closure letter.  [place] [phone] [email or web address]

## 2017-10-12 ENCOUNTER — Telehealth: Payer: Self-pay

## 2017-10-12 ENCOUNTER — Ambulatory Visit: Payer: Self-pay | Admitting: *Deleted

## 2017-10-12 NOTE — Telephone Encounter (Signed)
Transition Care Management Follow-Up Telephone Call   Date discharged and where: [facility] on 10/10/2017  How have you been since you were released from the hospital? Per patient's daughter, she has been doing a lot better. No more SOB, L chest pain or lower extremity swelling  Any patient concerns? Phlegm black green yellow which started 1 month ago  Items Reviewed:   Meds: Y  Allergies: Y  Dietary Changes Reviewed: Y  Functional Questionnaire:  Independent-I Dependent-D  ADLs:   Dressing- I w/ assist    Eating- I   Maintaining continence- I   Transferring-I w/ assist of wheelchair and walker   Transportation-D   Meal Prep- D   Managing Meds- I w/ assist  Confirmed importance and Date/Time of follow-up visits scheduled: Yes, Dr. Eulas Post 10/24/2017 @ 11:30am   Confirmed with patient if condition worsens to call PCP or go to the Emergency Dept. Patient was given office number and encouraged to call back with questions or concerns: Yes

## 2017-10-15 ENCOUNTER — Other Ambulatory Visit: Payer: Self-pay | Admitting: Pharmacist

## 2017-10-15 ENCOUNTER — Other Ambulatory Visit: Payer: Self-pay

## 2017-10-15 NOTE — Patient Outreach (Signed)
[facility]) Care Management  10/15/2017  [name] 1934-05-29 [id number]  Referral received from hospital liaison. Patient hospitalized 6/3-10/10/17 with heart failure. Transition of care completed per primary care office.  RNCM called to follow up and discuss care management needs.  Spoke with both daughter and client who was on speaker phone. Two patient identifiers confirmed. [name] referred the call to her daughter [name], but client was in the background on speaker phone.   [name] reports that she helps client with medication management. RNCM reviewed medications. She is on more than 18 medications. [name], [name] pharmacist recently closed due to not being able to maintain contact. RNCM discussed this with daughter [name]. She states that she has [name]'s number and is going to call when she gets off the phone with RNCM. [name] also reports that she is going to need assistance with Symbicort also. RNCM also reinforced that [facility] pharmacy assistant,  [name] is who has been helping her with medication assistance. [name] confirmed this.  [name] states that client has received the heart failure zone tool. She states that client has scales and has been weighing "about every other day".   [name] also reports she is aware that [facility] is the home health agency that is going to come out, but states she has not seen anyone. [name] states she thought someone called today, but that she has not called them back.  [name] reports client has a follow up appointment with [facility] next Tuesday and primary care next Wednesday.  RNCM provided contact number and encouraged to call as needed. [name] confirmed that she has the 24 hour nurse advice line contact number.  Plan: home visit next week. [facility] CM Care Plan Problem One     Most Recent Value  Care Plan Problem One  at risk for readmission as evidence of recent hospitalization.  (Pended)   Role Documenting the Problem One   Care Management Coordinator  (Pended)   [place] for Problem One  Active  (Pended)   [facility] Long Term Goal   client will not have readmission within the next 31-45 days.  (Pended)   [facility] Long Term Goal Start Date  10/15/17  (Pended)   Interventions for Problem One Long Term Goal  reviewed medications, reinforced importance of following up with provider appointment, encouraged client/family to follow up with home health agency and [facility] Pharmacist.  (Pended)       Thea Silversmith, RN, MSN, [place] Coordinator Cell: [phone]

## 2017-10-15 NOTE — Patient Outreach (Signed)
[facility]) Care Management  10/15/2017  [name] Jul 16, 1934 [id number]   Unable to maintain contact with patient. Patient has received medication assistance applications from Etter Sjogren, [place], but has not filled them out. Please see [facility]'s notes for further detail (10/11/2017).   Left HIPAA compliant message discussing the reasons for clinical pharmacy episode closure with [name], patient's daughter (DPR on file, primary point of contact). Sending discipline closure letter to patient's primary care doctor, Dr. Gildardo Cranker. Patient/[name] have my contact information.  I am happy to reopen the pharmacy episode if patient/family wishes to engage but with lack of contact, I will be closing the episode for now.   Ruben Reason, PharmD Clinical Pharmacist, [facility] [phone]

## 2017-10-16 ENCOUNTER — Other Ambulatory Visit: Payer: Self-pay

## 2017-10-16 DIAGNOSIS — E1122 Type 2 diabetes mellitus with diabetic chronic kidney disease: Secondary | ICD-10-CM | POA: Diagnosis not present

## 2017-10-16 DIAGNOSIS — N183 Chronic kidney disease, stage 3 (moderate): Secondary | ICD-10-CM | POA: Diagnosis not present

## 2017-10-16 DIAGNOSIS — M353 Polymyalgia rheumatica: Secondary | ICD-10-CM

## 2017-10-16 DIAGNOSIS — J449 Chronic obstructive pulmonary disease, unspecified: Secondary | ICD-10-CM | POA: Diagnosis not present

## 2017-10-16 DIAGNOSIS — G894 Chronic pain syndrome: Secondary | ICD-10-CM | POA: Diagnosis not present

## 2017-10-16 DIAGNOSIS — E1165 Type 2 diabetes mellitus with hyperglycemia: Secondary | ICD-10-CM | POA: Diagnosis not present

## 2017-10-16 DIAGNOSIS — I2510 Atherosclerotic heart disease of native coronary artery without angina pectoris: Secondary | ICD-10-CM | POA: Diagnosis not present

## 2017-10-16 DIAGNOSIS — D631 Anemia in chronic kidney disease: Secondary | ICD-10-CM | POA: Diagnosis not present

## 2017-10-16 DIAGNOSIS — I5033 Acute on chronic diastolic (congestive) heart failure: Secondary | ICD-10-CM | POA: Diagnosis not present

## 2017-10-16 DIAGNOSIS — I252 Old myocardial infarction: Secondary | ICD-10-CM | POA: Diagnosis not present

## 2017-10-16 DIAGNOSIS — C50919 Malignant neoplasm of unspecified site of unspecified female breast: Secondary | ICD-10-CM

## 2017-10-16 DIAGNOSIS — F329 Major depressive disorder, single episode, unspecified: Secondary | ICD-10-CM | POA: Diagnosis not present

## 2017-10-16 DIAGNOSIS — I130 Hypertensive heart and chronic kidney disease with heart failure and stage 1 through stage 4 chronic kidney disease, or unspecified chronic kidney disease: Secondary | ICD-10-CM | POA: Diagnosis not present

## 2017-10-16 NOTE — Patient Outreach (Addendum)
[facility]) Care Management  [place]   10/16/2017  LASYA VETTER 03/25/1935 [id number]  83 year old female outreached by [facility] services for a 30 day post discharge medication review. PMHx includes, but not limited to, hypertension, CAD, Chronic diastolic heart failure, COPD, GERD, Type 2 diabetes mellitus, hyperlipidemia, gout and breast cancer.  Successful outreach attempt to [facility], Ms. Collier daughter and caretaker.  HIPAA identifiers verified.   Subjective: Daughter reports that her mom is "doing alright."  She states that physical therapy is at the home during our conversation.  Throughout our phone call, the patient was heard coughing in the background.  Daughter reports that her mom is coughing up light tinges yellow sputum.  She states that she checks her CBGs twice daily with the last two nights running 424 and 341.  She states that she has not checked her mom's glucose today.  Objective:  HgA1c of 9.8% on 10/09/17 SCr of 1.47mg /dL on 10/10/17, calculated CrCl of 23ml/min Current Medications: Current Outpatient Medications  Medication Sig Dispense Refill  . albuterol (PROVENTIL HFA;VENTOLIN HFA) 108 (90 Base) MCG/ACT inhaler Inhale 1-2 puffs into the lungs every 6 (six) hours as needed for wheezing or shortness of breath. 1 Inhaler 6  . anastrozole (ARIMIDEX) 1 MG tablet Take 1 tablet (1 mg total) by mouth daily. 90 tablet 4  . Biotin 10000 MCG TABS Take 1 tablet by mouth daily.    . budesonide-formoterol (SYMBICORT) 160-4.5 MCG/ACT inhaler Inhale 2 puffs into the lungs 2 (two) times daily. For bronchitis 1 Inhaler 6  . cetirizine (ZYRTEC) 10 MG tablet Take 10 mg by mouth at bedtime as needed for allergies.     . clonazePAM (KLONOPIN) 1 MG tablet TAKE 2 TABLETS BY MOUTH AT BEDTIME FOR ANXIETY 60 tablet 0  . CRANBERRY EXTRACT PO Take 1 tablet by mouth daily.    . DULoxetine (CYMBALTA) 60 MG capsule Take one capsule by mouth once daily for anxiety 90  capsule 0  . estradiol (ESTRACE) 0.1 MG/GM vaginal cream Use every other evening use in vaginal area    Uses as needed    . febuxostat (ULORIC) 40 MG tablet Take 1 tablet (40 mg total) by mouth daily. 90 tablet 0  . furosemide (LASIX) 20 MG tablet Take 1 tablet (20 mg total) by mouth daily as needed for edema (shortness of breath, swelling in the legs). (Patient taking differently: Take 20 mg by mouth daily. Takes daily.) 30 tablet 3  . glucose blood (ONETOUCH VERIO) test strip USE 1 STRIP TO CHECK GLUCOSE THREE TIMES DAILY  E11.21 300 each 12  . hydrALAZINE (APRESOLINE) 25 MG tablet Take 1 tablet (25 mg total) by mouth 3 (three) times daily. (Patient taking differently: Take 25 mg by mouth 3 (three) times daily. Pt. takes twice daily because she gets up late every morning.) 90 tablet 3  . hydrOXYzine (ATARAX/VISTARIL) 10 MG tablet Take 1 tablet (10 mg total) by mouth 3 (three) times daily as needed for itching. 60 tablet 11  . insulin aspart (NOVOLOG FLEXPEN) 100 UNIT/ML FlexPen Before each meal 3 times a day, 140-199 - 2 units, 200-250 - 4 units, 251-299 - 6 units,  300-349 - 8 units,  350 or above 10 units. Insulin PEN if approved, provide syringes and needles if needed. 15 mL 0  . Insulin Glargine (TOUJEO SOLOSTAR) 300 UNIT/ML SOPN Inject 35 Units into the skin 2 (two) times daily. (before breakfast and before supper) 12 pen 0  .  isosorbide mononitrate (IMDUR) 60 MG 24 hr tablet TAKE 1 TABLET BY MOUTH EVERY DAY 90 tablet 1  . Lancet Device MISC Onetouch verio machine lancets, check blood sugar twice daily as directed DX E11.69 100 each 3  . magnesium oxide (MAG-OX) 400 (241.3 Mg) MG tablet Take 1 tablet (400 mg total) by mouth daily for 7 days. 7 tablet 0  . meclizine (ANTIVERT) 25 MG tablet Take one tablet by mouth three times daily as needed for dizziness 60 tablet 0  . metoprolol succinate (TOPROL-XL) 25 MG 24 hr tablet TAKE 1 TABLET BY MOUTH EVERY DAY FOR blood pressure 90 tablet 1  .  nitroGLYCERIN (NITROSTAT) 0.4 MG SL tablet Take one tablet under the tongue every 5 minutes as needed for chest pain 50 tablet 0  . oxyCODONE-acetaminophen (PERCOCET) 10-325 MG tablet Take 1 tablet by mouth every 6 (six) hours. 120 tablet 0  . pantoprazole (PROTONIX) 40 MG tablet Take 1 tablet (40 mg total) by mouth daily. 90 tablet 3  . pravastatin (PRAVACHOL) 20 MG tablet TAKE 1 TABLET BY MOUTH EVERY DAY FOR cholesterol 90 tablet 1  . rivaroxaban (XARELTO) 10 MG TABS tablet Take 1 tablet (10 mg total) by mouth daily. 30 tablet 11  . sennosides-docusate sodium (SENOKOT-S) 8.6-50 MG tablet Take 1 tablet by mouth daily as needed for constipation.    . sodium chloride (OCEAN) 0.65 % SOLN nasal spray Place 1 spray into both nostrils 3 (three) times daily as needed (for sinus congestion.). RINSE AS NEEDED FOR SINUS CONGESTION     . Turmeric 500 MG CAPS Take 1 tablet by mouth daily.    [name] gabapentin (NEURONTIN) 100 MG capsule Take 100 mg by mouth at bedtime.    . memantine (NAMENDA) 5 MG tablet Take 5-10 mg by mouth See admin instructions. Take 2 tablets (10mg ) every AM and 1 tablet (5mg ) every evening.  6   No current facility-administered medications for this visit.     Functional Status: In your present state of health, do you have any difficulty performing the following activities: 10/09/2017 08/17/2017  Hearing? Y Y  Comment - difficulty hearing at times  Vision? Y Y  Comment - seeing a diabetic eye specialist  Difficulty concentrating or making decisions? [name]  Walking or climbing stairs? Y Y  Dressing or bathing? Y Y  Doing errands, shopping? [name]  Preparing Food and eating ? - Y  Using the Toilet? - Y  In the past six months, have you accidently leaked urine? - Y  Do you have problems with loss of bowel control? - Y  Managing your Medications? - Y  Managing your Finances? - Y  Housekeeping or managing your Housekeeping? - Y  Some recent data might be hidden    Fall/Depression  Screening: Fall Risk  08/17/2017 08/02/2017 06/29/2017  Falls in the past year? Yes Yes No  Number falls in past yr: 2 or more 2 or more -  Comment - - -  Injury with Fall? - No -  Comment - - -  Risk Factor Category  High Fall Risk - -  Risk for fall due to : History of fall(s);Impaired balance/gait;Impaired mobility - -  Follow up Education provided;Falls prevention discussed - -   PHQ 2/9 Scores 08/17/2017 06/25/2017 06/19/2017 06/08/2017 04/02/2017 03/24/2016 04/21/2015  PHQ - 2 Score - 0 0 0 3 3 6   PHQ- 9 Score - - - - 10 17 19   Exception Documentation Other- indicate reason in comment  box - - - - - -  Not completed spoke with daughter, did not speak with patient - - - - - -   ASSESSMENT: Date Discharged from Hospital: 10/10/17 Date Medication Reconciliation Performed: 10/16/2017  New Medications at Discharge:  Magnesium x 7 days  Patient was recently discharged from hospital and all medications have been reviewed  Drugs sorted by system:  Neurologic/Psychologic: clonazepam, duloxetine, meclizine  Cardiovascular: furosemide, hydralazine, isosorbide mononitrate, metoprolol succinate, nitroglycerin, pravastatin, rivaroxaban  Pulmonary/Allergy: albuterol MDI, budesonide/formoterol, certirizine, hydroxyzine   Gastrointestinal: pantoprazole, senosides/docusate  Endocrine: insulin aspart, insulin glargine  Topical: estradiol vaginal cream  Pain: oxycodone/acetaminophen   Vitamins/Minerals: biotin, magnesium oxide   Miscellaneous: anastrozole, cranberry extract, febuxostat, ocean nasal spray, tumeric  Medications to avoid in the elderly:  Per the [employer] List, hydroxyzine and meclizine are highly anticholinergic and clearance is reduced with advanced age. Risk of confusion, dry mouth, constipation and other anticholinergic effects or toxicity may occur.  There is strong evidence to avoid use in the elderly.  Other issues noted:  Patient's daughter reports that Ms. Stefanko stopped  taking gabapentin and memantine a month ago.  She was trying to decrease the amount of medications her mother was taking.  She states that she now feels that memantine was helping with her mother's memory and feels that she should restart this medication.  Cetirizine- when GFR </= 50 ml/min decrease dose to 5 mg daily. Patient only takes prn.    Patient has a PCP appointment next week.  I told her to discuss the gabapentin and memantine with the doctor and she made herself a reminder note.   Plan: I will route my note to PCP, Dr. Eulas Post.   Will close [facility] medication review case.  Joetta Manners, PharmD Clinical Pharmacist [place] [phone]

## 2017-10-17 ENCOUNTER — Other Ambulatory Visit: Payer: Self-pay | Admitting: *Deleted

## 2017-10-17 ENCOUNTER — Other Ambulatory Visit: Payer: Self-pay | Admitting: Internal Medicine

## 2017-10-17 ENCOUNTER — Other Ambulatory Visit: Payer: Self-pay | Admitting: Hematology

## 2017-10-17 DIAGNOSIS — G894 Chronic pain syndrome: Secondary | ICD-10-CM

## 2017-10-17 DIAGNOSIS — F418 Other specified anxiety disorders: Secondary | ICD-10-CM

## 2017-10-17 MED ORDER — OXYCODONE-ACETAMINOPHEN 10-325 MG PO TABS: 1.0000 | ORAL_TABLET | Freq: Four times a day (QID) | ORAL | 0 refills | Status: DC

## 2017-10-17 NOTE — Telephone Encounter (Signed)
Patient requested [facility] Verified LR: 09/19/2017 Pharmacy Confirmed Pended Rx and sent to Dr. Eulas Post for approval

## 2017-10-18 ENCOUNTER — Other Ambulatory Visit: Payer: Self-pay | Admitting: Internal Medicine

## 2017-10-18 DIAGNOSIS — F418 Other specified anxiety disorders: Secondary | ICD-10-CM

## 2017-10-18 MED ORDER — CLONAZEPAM 1 MG PO TABS
ORAL_TABLET | ORAL | 0 refills | Status: DC
Start: 2017-10-18 — End: 2018-09-19

## 2017-10-18 NOTE — Addendum Note (Signed)
Addended by: Denyse Amass on: 10/18/2017 01:33 PM   Modules accepted: Orders

## 2017-10-18 NOTE — Telephone Encounter (Signed)
A medication refill was received from pharmacy for clonazepam 1mg . Rx was called in to pharmacy after verifying last fill date, provider, and quantity on PMP [place].

## 2017-10-22 NOTE — Progress Notes (Signed)
.    Hematology oncology Clinic Followup Note  Date of service 10/23/17   Patient Care Team: Gildardo Cranker, DO as PCP - General (Internal Medicine) [name], [name], MD (Inactive) as Consulting Physician (Cardiology) [name], MD as Consulting Physician (Ophthalmology) [name], MD as Consulting Physician (General Surgery) [name], MD as Referring Physician (Obstetrics and Gynecology) Leona Singleton, RN as Comfort Management Luretha Rued, RN as [place] Management  CHIEF COMPLAINTS/PURPOSE OF CONSULTATION:  F/u for breast cancer.  DIAGNOSIS: left-sided presumed stage IA  (pT1c,pNx[cN0], cM0) invasive ductal carcinoma grade 2 out of 3, strongly ER +100%, strongly PR +100% and HER-2/neu negative. Given her high risk cardiac status lumpectomy had to be done under local anesthesia and sentinel lymph node biopsy was not possible. She has accompanying DCIS of intermediate grade present at margins. ECOG performance status is 3. -Had a mammogram/tomosynthesis done on 07/29/2015 which shows residual suspicious calcification in the left breast postero-medial to the biopsy site. Has known residual DCIS.  No evidence of new malignancy.  No evidence of right breast malignancy.   CURRENT TREATMENT: Arimidex 60m po daily  HISTORY OF PRESENTING ILLNESS: -please see my initial consultation for details of intial presentation  INTERVAL HISTORY Mrs. CGengleris here for her scheduled 6 month followup of her breast cancer. [name] last visit with [place] on 03/21/17. She is accompanied today by her daughter. The pt reports that she is doing well overall. She will be seeing her PCP Dr MSimmie Daviestomorrow.   [name] last mammogram was 08/16/16 and she is not sure how she missed her last mammogram in April 2019. She continues to take Arimidex and Vitamin D.   Bone density study 03/2017 showed BMD measured at Forearm Radius 33% is  0.928 g/cm2 with a T-score of 0.5. This patient is considered normal according to [place] criteria. Lumbar Spine and Dual Femurs not used because patient in wheelchair and states that laying flat isn't something she can do because she gets short of breath and can't Breath.  She notes that she has developed arthritis in her feet and her feet and legs have begun swelling. She also notes that she began a water pill after appearing to the ED on 10/08/17 for fluid on her lungs causing SOB.   The pt also notes that she has continued to have some right breast pain, which she describes as unchanging.   The pt notes that she has a generalized pain "all over" that she has discussed wit her PCP. She notes that wearing jewelry causes her pain.  She does note that she has developed a craving for eating ice.   Lab results today (10/23/17) of CBC, CMP, and Reticulocytes is as follows: all values are WNL except for WBC at 11.6k, RBC at 3.44, HGB at 8.5, HCT at 28.4, MCH at 24.7, MCHC at 29.9, RDW at 15.3, ANC at 7.4k, Sodium at 134, Glucose at 244, BUN at 35, Creatinine at 2.24, Albumin at 2.8, GFR at 19. Vitamin D 25 hydroxy 10/23/17 is 30.9  On review of systems, pt reports stable right breast pain, leg swelling, and denies blood in the stools, blood in the urine, nose bleeds, breast skin changes, new breast growths, noticing any new lumps or bumps, abdominal pains, and any other symptoms.   MEDICAL HISTORY:  Past Medical History:  Diagnosis Date  . Allergy    takes Mucinex daily as needed  .  Anemia, unspecified   . Anxiety    takes Clonazepam daily as needed  . Arthritis   . CHF (congestive heart failure) (HCC)    takes Furosemide daily  . Coronary artery disease    a. s/p IMI 2004 tx with BMS to RCA;  b. s/p Promus DES to RCA 2/12 c. 06/2014 High risk stress test follow up cath 06/2014 showd patent stent--< med therapy for other mild to moedrate disease   . Coronary  atherosclerosis of native coronary artery   . Depression    takes Cymbalta daily  . Diabetes mellitus    insulin daily  . Dysuria   . GERD (gastroesophageal reflux disease)    takes Protonix daily  . Gout, unspecified    takes Colchicine daily  . Headache    occasionally  . History of blood clots about 53yr ago   in legs-takes Coumadin   . Hyperlipidemia    takes Pravastatin daily  . Hypertension    takes Imdur and Metoprolol daily  . Insomnia   . Joint pain   . Joint swelling   . Myocardial infarction ([place]  . Numbness   . Obstructive sleep apnea   . Osteoarthritis   . Osteoarthritis   . Osteoarthrosis, unspecified whether generalized or localized, lower leg   . Pain, chronic   . Polymyalgia rheumatica ([place]   . Pulmonary emboli ([place] 9[place]13   felt to need lifelong anticoagulation  . Shortness of breath dyspnea    with exertion and has Albuterol inhaler prn  . Type II or unspecified type diabetes mellitus without mention of complication, uncontrolled   . Urinary incontinence    takes Linzess daily  . Vertigo    hx of;was taking Meclizine if needed    SURGICAL HISTORY: Past Surgical History:  Procedure Laterality Date  . ABDOMINAL HYSTERECTOMY     partial  . APPENDECTOMY    . blood clots/legs and lungs  2013  . BREAST BIOPSY Left 07/22/2014  . BREAST BIOPSY Left 02/10/2013  . BREAST LUMPECTOMY Left 11/05/2014  . BREAST LUMPECTOMY WITH RADIOACTIVE SEED LOCALIZATION Left 11/05/2014   Procedure: LEFT BREAST LUMPECTOMY WITH RADIOACTIVE SEED LOCALIZATION;  Surgeon: DCoralie Keens MD;  [facility]  Service: General;  Laterality: Left;  . CARDIAC CATHETERIZATION    . COLONOSCOPY    . CORONARY ANGIOPLASTY  2  . ESOPHAGOGASTRODUODENOSCOPY (EGD) WITH PROPOFOL N/A 11/07/2016   Procedure: ESOPHAGOGASTRODUODENOSCOPY (EGD) WITH PROPOFOL;  Surgeon: GGatha Mayer MD;  Location: [facility];  Service: Endoscopy;  Laterality: N/A;  . EXCISION OF SKIN TAG Right  11/05/2014   Procedure: EXCISION OF RIGHT EYELID SKIN TAG;  Surgeon: DCoralie Keens MD;  [facility]  Service: General;  Laterality: Right;  . EYE SURGERY Bilateral    cataract   . GASTRIC BYPASS  1977    reversed in 1979, [place]/A 06/29/2014   Procedure: LEFT HEART CATHETERIZATION WITH CORONARY ANGIOGRAM;  Surgeon: TTroy Sine MD;  [facility];  Service: Cardiovascular;  Laterality: N/A;  . MI with stent placement  2004    SOCIAL HISTORY: Social History   Socioeconomic History  . Marital status: Widowed    Spouse name: Not on file  . Number of children: Not on file  . Years of education: Not on file  . Highest education level: Not on file  Occupational History  . Occupation: retired  SScientific laboratory technician . Financial resource strain: Not  hard at all  . Food insecurity:    Worry: Never true    Inability: Never true  . Transportation needs:    Medical: Yes    Non-medical: Yes  Tobacco Use  . Smoking status: Never Smoker  . Smokeless tobacco: Never Used  Substance and Sexual Activity  . Alcohol use: No    Alcohol/week: 0.0 oz  . Drug use: No  . Sexual activity: Not Currently  Lifestyle  . Physical activity:    Days per week: 0 days    Minutes per session: 0 min  . Stress: Rather much  Relationships  . Social connections:    Talks on phone: Once a week    Gets together: Three times a week    Attends religious service: 1 to 4 times per year    Active member of club or organization: Yes    Attends meetings of clubs or organizations: 1 to 4 times per year    Relationship status: Widowed  . Intimate partner violence:    Fear of current or ex partner: No    Emotionally abused: No    Physically abused: No    Forced sexual activity: No  Other Topics Concern  . Not on file  Social History Narrative  . Not on file    FAMILY HISTORY: Family History  Problem Relation Age of Onset  . Breast  cancer Mother 69  . Heart disease Mother   . Throat cancer Father   . Hypertension Father   . Arthritis Father   . Diabetes Father   . Arthritis Sister   . Obesity Sister   . Diabetes Sister   . Heart disease Cousin   . Colon cancer Neg Hx   . Stomach cancer Neg Hx   . Esophageal cancer Neg Hx     ALLERGIES:  is allergic to sulfonamide derivatives; tramadol; and aricept [donepezil hcl].  MEDICATIONS:  Current Outpatient Medications  Medication Sig Dispense Refill  . albuterol (PROVENTIL HFA;VENTOLIN HFA) 108 (90 Base) MCG/ACT inhaler Inhale 1-2 puffs into the lungs every 6 (six) hours as needed for wheezing or shortness of breath. 1 Inhaler 6  . anastrozole (ARIMIDEX) 1 MG tablet TAKE 1 TABLET BY MOUTH daily 90 tablet 1  . Biotin 10000 MCG TABS Take 1 tablet by mouth daily.    . budesonide-formoterol (SYMBICORT) 160-4.5 MCG/ACT inhaler Inhale 2 puffs into the lungs 2 (two) times daily. For bronchitis 1 Inhaler 6  . cetirizine (ZYRTEC) 10 MG tablet Take 10 mg by mouth at bedtime as needed for allergies.     . clonazePAM (KLONOPIN) 1 MG tablet TAKE 2 TABLETS BY MOUTH AT BEDTIME AS NEEDED FOR ANXIETY 60 tablet 0  . CRANBERRY EXTRACT PO Take 1 tablet by mouth daily.    . DULoxetine (CYMBALTA) 60 MG capsule Take one capsule by mouth once daily for anxiety 90 capsule 1  . estradiol (ESTRACE) 0.1 MG/GM vaginal cream Use every other evening use in vaginal area    Uses as needed    . furosemide (LASIX) 20 MG tablet Take 1 tablet (20 mg total) by mouth daily as needed for edema (shortness of breath, swelling in the legs). (Patient taking differently: Take 20 mg by mouth daily. Takes daily.) 30 tablet 3  . gabapentin (NEURONTIN) 100 MG capsule Take 100 mg by mouth at bedtime.    [name] glucose blood (ONETOUCH VERIO) test strip USE 1 STRIP TO CHECK GLUCOSE THREE TIMES DAILY  E11.21 300 each 12  . hydrALAZINE (  APRESOLINE) 25 MG tablet Take 1 tablet (25 mg total) by mouth 3 (three) times daily.  (Patient taking differently: Take 25 mg by mouth 3 (three) times daily. Pt. takes twice daily because she gets up late every morning.) 90 tablet 3  . hydrOXYzine (ATARAX/VISTARIL) 10 MG tablet Take 1 tablet (10 mg total) by mouth 3 (three) times daily as needed for itching. 60 tablet 11  . insulin aspart (NOVOLOG FLEXPEN) 100 UNIT/ML FlexPen Before each meal 3 times a day, 140-199 - 2 units, 200-250 - 4 units, 251-299 - 6 units,  300-349 - 8 units,  350 or above 10 units. Insulin PEN if approved, provide syringes and needles if needed. 15 mL 0  . Insulin Glargine (TOUJEO SOLOSTAR) 300 UNIT/ML SOPN Inject 35 Units into the skin 2 (two) times daily. (before breakfast and before supper) 12 pen 0  . isosorbide mononitrate (IMDUR) 60 MG 24 hr tablet TAKE 1 TABLET BY MOUTH EVERY DAY 90 tablet 1  . Lancet Device MISC Onetouch verio machine lancets, check blood sugar twice daily as directed DX E11.69 100 each 3  . meclizine (ANTIVERT) 25 MG tablet Take one tablet by mouth three times daily as needed for dizziness 60 tablet 0  . memantine (NAMENDA) 5 MG tablet Take 5-10 mg by mouth See admin instructions. Take 2 tablets (24m) every AM and 1 tablet (557m every evening.  6  . metoprolol succinate (TOPROL-XL) 25 MG 24 hr tablet TAKE 1 TABLET BY MOUTH EVERY DAY FOR blood pressure 90 tablet 1  . nitroGLYCERIN (NITROSTAT) 0.4 MG SL tablet Take one tablet under the tongue every 5 minutes as needed for chest pain 50 tablet 0  . oxyCODONE-acetaminophen (PERCOCET) 10-325 MG tablet Take 1 tablet by mouth every 6 (six) hours. 120 tablet 0  . pantoprazole (PROTONIX) 40 MG tablet Take 1 tablet (40 mg total) by mouth daily. 90 tablet 3  . pravastatin (PRAVACHOL) 20 MG tablet TAKE 1 TABLET BY MOUTH EVERY DAY FOR cholesterol 90 tablet 1  . rivaroxaban (XARELTO) 10 MG TABS tablet Take 1 tablet (10 mg total) by mouth daily. 30 tablet 11  . sennosides-docusate sodium (SENOKOT-S) 8.6-50 MG tablet Take 1 tablet by mouth daily as  needed for constipation.    . sodium chloride (OCEAN) 0.65 % SOLN nasal spray Place 1 spray into both nostrils 3 (three) times daily as needed (for sinus congestion.). RINSE AS NEEDED FOR SINUS CONGESTION     . Turmeric 500 MG CAPS Take 1 tablet by mouth daily.    . Marland KitchenLORIC 40 MG tablet Take 1 tablet (40 mg total) by mouth daily. 90 tablet 1   No current facility-administered medications for this visit.     REVIEW OF SYSTEMS:   A 10+ POINT REVIEW OF SYSTEMS WAS OBTAINED including neurology, dermatology, psychiatry, cardiac, respiratory, lymph, extremities, GI, GU, Musculoskeletal, constitutional, breasts, reproductive, HEENT.  All pertinent positives are noted in the HPI.  All others are negative.   PHYSICAL EXAMINATION:  ECOG PERFORMANCE STATUS: 3 - Symptomatic, >50% confined to bed  Vitals:   10/23/17 1508  BP: (!) 147/62  Pulse: 97  Resp: 18  Temp: 98.1 F (36.7 C)  SpO2: 97%   Filed Weights   10/23/17 1508  Weight: (!) 332 lb 11.2 oz (150.9 kg)    GENERAL:alert, in no acute distress and comfortable SKIN: no acute rashes, no significant lesions EYES: conjunctiva are pink and non-injected, sclera anicteric OROPHARYNX: MMM, no exudates, no oropharyngeal erythema or ulceration NECK: supple,  no JVD LYMPH:  no palpable lymphadenopathy in the cervical, axillary or inguinal regions.  LUNGS: clear to auscultation b/l with normal respiratory effort HEART: regular rate & rhythm ABDOMEN:  Obese, normoactive bowel sounds , non tender, not distended. Extremity: bilateral 1+ pedal edema PSYCH: alert & oriented x 3 with fluent speech NEURO: no focal motor/sensory deficits  BREAST: scribe chaperone present for exam, no breast lumps or obvious palpable regional lymph nodes  LABORATORY DATA:  I have reviewed the data as listed  . CBC Latest Ref Rng & Units 10/23/2017 10/10/2017 10/09/2017  WBC 3.9 - 10.3 K/uL 11.6(H) 12.1(H) 13.1(H)  Hemoglobin 11.6 - 15.9 g/dL 8.5(L) 8.1(L) 8.7(L)    Hematocrit 34.8 - 46.6 % 28.4(L) 27.3(L) 29.0(L)  Platelets 145 - 400 K/uL 317 324 337   . CBC    Component Value Date/Time   WBC 11.6 (H) 10/23/2017 1418   WBC 12.1 (H) 10/10/2017 0448   RBC 3.44 (L) 10/23/2017 1418   RBC 3.44 (L) 10/23/2017 1418   HGB 8.5 (L) 10/23/2017 1418   HGB 8.7 (L) 03/21/2017 1351   HCT 28.4 (L) 10/23/2017 1418   HCT 28.9 (L) 03/21/2017 1351   PLT 317 10/23/2017 1418   PLT 295 03/21/2017 1351   MCV 82.6 10/23/2017 1418   MCV 84.3 03/21/2017 1351   MCH 24.7 (L) 10/23/2017 1418   MCHC 29.9 (L) 10/23/2017 1418   RDW 15.3 (H) 10/23/2017 1418   RDW 15.5 (H) 03/21/2017 1351   LYMPHSABS 3.1 10/23/2017 1418   LYMPHSABS 2.4 03/21/2017 1351   MONOABS 0.6 10/23/2017 1418   MONOABS 0.7 03/21/2017 1351   EOSABS 0.5 10/23/2017 1418   EOSABS 0.2 03/21/2017 1351   BASOSABS 0.0 10/23/2017 1418   BASOSABS 0.0 03/21/2017 1351    . CMP Latest Ref Rng & Units 10/23/2017 10/10/2017  Glucose 65 - 99 mg/dL 244(H) 273(H)  BUN 7 - 25 mg/dL 35(H) 25(H)  Creatinine 0.60 - 0.88 mg/dL 2.24(H) 1.47(H)  Sodium 135 - 146 mmol/L 134(L) 134(L)  Potassium 3.5 - 5.3 mmol/L 4.5 4.1  Chloride 98 - 110 mmol/L 98 101  CO2 20 - 32 mmol/L 27 28  Calcium 8.6 - 10.4 mg/dL 9.0 8.4(L)  Total Protein 6.4 - 8.3 g/dL 7.8 -  Total Bilirubin 0.2 - 1.2 mg/dL 0.4 -  Alkaline Phos 40 - 150 U/L 85 -  AST 5 - 34 U/L 13 -  ALT 0 - 55 U/L 7 -      RADIOGRAPHIC STUDIES: I have personally reviewed the radiological images as listed and agreed with the findings in the report.  Dg Chest 2 View  Result Date: 10/08/2017 CLINICAL DATA:  Chest pressure and shortness of breath for the past day. History of COPD, CHF, previous MI, nonsmoker. EXAM: CHEST - 2 VIEW COMPARISON:  PA and lateral chest x-ray of July 25, 2017 FINDINGS: The lungs are borderline hypoinflated. The right hemidiaphragm is slightly higher than the left. The interstitial markings are coarse and slightly more conspicuous overall today.  The cardiac silhouette is mildly enlarged. The pulmonary vascularity is not engorged. There is calcification in the wall of the aortic arch. There is multilevel degenerative disc disease of the thoracic spine. IMPRESSION: Mild interstitial prominence may reflect low-grade CHF. There are underlying chronic bronchitic changes. No alveolar pneumonia. Thoracic aortic atherosclerosis. Electronically Signed   By: David  Martinique M.D.   On: 10/08/2017 13:42     CLINICAL DATA:  Annual mammography. The patient had surgery for DCIS in the left  breast in 2016. There were positive margins at surgery but the patient could not return for additional surgery. Known suspicious calcifications remain.  EXAM: 2D DIGITAL DIAGNOSTIC BILATERAL MAMMOGRAM WITH CAD AND ADJUNCT TOMO  COMPARISON:  Previous exam(s).  ACR Breast Density Category b: There are scattered areas of fibroglandular density.  FINDINGS: The suspicious calcifications posterior to the left lumpectomy site are stable. No other interval changes or other suspicious findings.  Mammographic images were processed with CAD.  IMPRESSION: Continued suspicious calcifications posterior to the left lumpectomy site. No other changes.  RECOMMENDATION: Continued annual mammography for surveillance. Continued oncologic follow up.  I have discussed the findings and recommendations with the patient. Results were also provided in writing at the conclusion of the visit. If applicable, a reminder letter will be sent to the patient regarding the next appointment.  BI-RADS CATEGORY  6: Known biopsy-proven malignancy.   Electronically Signed   By: Dorise Bullion III M.D   On: 08/16/2016 15:21   ASSESSMENT & PLAN:   Mrs. Spindler is a very wonderful 83 y.o. African-American female with multiple medical comorbidities as described above with   #1Left-sided presumed stage IA  (pT1c,pNx[cN0], cM0) invasive ductal carcinoma grade 2 out of 3,  strongly ER +100%, strongly PR +100% and HER-2/neu negative. Given her high risk cardiac status lumpectomy had to be done under local anesthesia and sentinel lymph node biopsy was not possible. She has accompanying DCIS of intermediate grade present at margins. ECOG performance status is 3. Dexa scan "low bone mass" per WHO. -Bone density scan on 03/12/2017 with results showing: T-score of 0.5 at left forearm. Lumbar spine and dual femurs not completed due to patient in a wheelchair and not able to lay flat due to SOB while laying flat.   #2 significant coronary artery disease with stress test in February 2016 suggesting likely multivessel disease. Has uncontrolled diabetes, hypertension, dyslipidemia, untreated sleep apnea, coronary disease, severe arthritis all of which are significantly limiting her quality of life. #3 vitamin D deficiency - 25OH vitamin D level of 10, s/p high dose ergocalciferol re-placement with 25OH Vit D improvement to 30.9.  Now on vit D 2000 units daily.   Plan -no clear clinical indication for breast cancer progression at this time.  -Mammogram missed in 08/2017 -- reordered -continue Arimidex 25m po daily - refill sent. -continue vit D 2000 IU daily  -at least 1200-15076mpo calcium intake daily -Discussed pt labwork today, 10/23/17; anemia noted with Hgb at 8.5, in the setting of worsening CKD.  -Last Bone density study from 03/12/17 did not show signs of bone loss at her wrist -Will order mammogram  -Recommend that PCP determine if increasing Gabapentin dose would be helpful for patient's generalized pain -Given generalized pain, body aches, will order CT scan and bone scan in addition to the mammogram to evaluate if her breast cancer has metastasized -CT Chest Abdomen/Pelvis W/O contrast given her CKD -The pt has no prohibitive toxicities from continuing Arimidex at this time.     #4 Worsening Anemia ? Slow GI losses . Multiple metabolic insults in the bone marrow  including uncontrolled diabetes .patient notes that her fasting glucose and so and in the 300s . Plan  -She was recommended to follow-up with her primary care physician to optimize her diabetes management . -Regarding her newly developed picca, will add ferritin and EPO to labs on next visit -Begin PO Iron Polysaccharide  15059mo daily -- ferritin goal would to >100 in the setting  of CKD  MMG diagnostic with tomo in 2 weeks CT chest/abd/pelvis without contrast in 2 weeks Bone scan in 2 weeks RTC with Dr Irene Limbo in 4 weeks with labs   Continue follow-up with your primary care physician Dr. Eulas Post for other ongoing cares.  . The total time spent in the appointment was 25 minutes and more than 50% was on counseling and direct patient cares.    Sullivan Lone MD MS Hematology/Oncology Physician [facility]  (Office):       [phone] (Work cell):  [phone] (Fax):           [phone]  I, Baldwin Jamaica, am acting as a scribe for Dr Irene Limbo.   .I have reviewed the above documentation for accuracy and completeness, and I agree with the above. [name] MD

## 2017-10-23 ENCOUNTER — Telehealth: Payer: Self-pay | Admitting: Hematology

## 2017-10-23 ENCOUNTER — Inpatient Hospital Stay: Payer: PPO | Attending: Hematology | Admitting: Hematology

## 2017-10-23 ENCOUNTER — Inpatient Hospital Stay: Payer: PPO

## 2017-10-23 VITALS — BP 147/62 | HR 97 | Temp 98.1°F | Resp 18 | Ht 67.0 in | Wt 332.7 lb

## 2017-10-23 DIAGNOSIS — E1165 Type 2 diabetes mellitus with hyperglycemia: Secondary | ICD-10-CM | POA: Diagnosis not present

## 2017-10-23 DIAGNOSIS — C50912 Malignant neoplasm of unspecified site of left female breast: Secondary | ICD-10-CM | POA: Diagnosis not present

## 2017-10-23 DIAGNOSIS — C50812 Malignant neoplasm of overlapping sites of left female breast: Secondary | ICD-10-CM

## 2017-10-23 DIAGNOSIS — Z170 Estrogen receptor positive status [ER+]: Secondary | ICD-10-CM

## 2017-10-23 DIAGNOSIS — E559 Vitamin D deficiency, unspecified: Secondary | ICD-10-CM

## 2017-10-23 DIAGNOSIS — R52 Pain, unspecified: Secondary | ICD-10-CM | POA: Diagnosis not present

## 2017-10-23 DIAGNOSIS — N189 Chronic kidney disease, unspecified: Secondary | ICD-10-CM | POA: Diagnosis not present

## 2017-10-23 DIAGNOSIS — E1122 Type 2 diabetes mellitus with diabetic chronic kidney disease: Secondary | ICD-10-CM | POA: Diagnosis not present

## 2017-10-23 DIAGNOSIS — M1990 Unspecified osteoarthritis, unspecified site: Secondary | ICD-10-CM | POA: Diagnosis not present

## 2017-10-23 DIAGNOSIS — R937 Abnormal findings on diagnostic imaging of other parts of musculoskeletal system: Secondary | ICD-10-CM | POA: Diagnosis not present

## 2017-10-23 DIAGNOSIS — D649 Anemia, unspecified: Secondary | ICD-10-CM | POA: Diagnosis not present

## 2017-10-23 DIAGNOSIS — E785 Hyperlipidemia, unspecified: Secondary | ICD-10-CM | POA: Diagnosis not present

## 2017-10-23 DIAGNOSIS — I130 Hypertensive heart and chronic kidney disease with heart failure and stage 1 through stage 4 chronic kidney disease, or unspecified chronic kidney disease: Secondary | ICD-10-CM | POA: Diagnosis not present

## 2017-10-23 DIAGNOSIS — I2510 Atherosclerotic heart disease of native coronary artery without angina pectoris: Secondary | ICD-10-CM | POA: Diagnosis not present

## 2017-10-23 DIAGNOSIS — I509 Heart failure, unspecified: Secondary | ICD-10-CM | POA: Diagnosis not present

## 2017-10-23 DIAGNOSIS — Z79811 Long term (current) use of aromatase inhibitors: Secondary | ICD-10-CM | POA: Diagnosis not present

## 2017-10-23 LAB — COMPREHENSIVE METABOLIC PANEL
ALT: 7 U/L (ref 0–55)
AST: 13 U/L (ref 5–34)
Albumin: 2.8 g/dL — ABNORMAL LOW (ref 3.5–5.0)
Alkaline Phosphatase: 85 U/L (ref 40–150)
Anion gap: 9 (ref 3–11)
BUN: 35 mg/dL — ABNORMAL HIGH (ref 7–26)
CO2: 27 mmol/L (ref 22–29)
Calcium: 9 mg/dL (ref 8.4–10.4)
Chloride: 98 mmol/L (ref 98–109)
Creatinine, Ser: 2.24 mg/dL — ABNORMAL HIGH (ref 0.60–1.10)
GFR calc Af Amer: 22 mL/min — ABNORMAL LOW (ref >60)
GFR calc non Af Amer: 19 mL/min — ABNORMAL LOW (ref >60)
Glucose, Bld: 244 mg/dL — ABNORMAL HIGH (ref 70–140)
Potassium: 4.5 mmol/L (ref 3.5–5.1)
Sodium: 134 mmol/L — ABNORMAL LOW (ref 136–145)
Total Bilirubin: 0.4 mg/dL (ref 0.2–1.2)
Total Protein: 7.8 g/dL (ref 6.4–8.3)

## 2017-10-23 LAB — CBC WITH DIFFERENTIAL (CANCER CENTER ONLY)
Basophils Absolute: 0 10*3/uL (ref 0.0–0.1)
Basophils Relative: 0 %
Eosinophils Absolute: 0.5 10*3/uL (ref 0.0–0.5)
Eosinophils Relative: 4 %
HCT: 28.4 % — ABNORMAL LOW (ref 34.8–46.6)
Hemoglobin: 8.5 g/dL — ABNORMAL LOW (ref 11.6–15.9)
Lymphocytes Relative: 27 %
Lymphs Abs: 3.1 10*3/uL (ref 0.9–3.3)
MCH: 24.7 pg — ABNORMAL LOW (ref 25.1–34.0)
MCHC: 29.9 g/dL — ABNORMAL LOW (ref 31.5–36.0)
MCV: 82.6 fL (ref 79.5–101.0)
Monocytes Absolute: 0.6 10*3/uL (ref 0.1–0.9)
Monocytes Relative: 5 %
Neutro Abs: 7.4 10*3/uL — ABNORMAL HIGH (ref 1.5–6.5)
Neutrophils Relative %: 64 %
Platelet Count: 317 10*3/uL (ref 145–400)
RBC: 3.44 MIL/uL — ABNORMAL LOW (ref 3.70–5.45)
RDW: 15.3 % — ABNORMAL HIGH (ref 11.2–14.5)
WBC Count: 11.6 10*3/uL — ABNORMAL HIGH (ref 3.9–10.3)

## 2017-10-23 LAB — RETICULOCYTES
RBC.: 3.44 MIL/uL — ABNORMAL LOW (ref 3.70–5.45)
Retic Count, Absolute: 58.5 10*3/uL (ref 33.7–90.7)
Retic Ct Pct: 1.7 % (ref 0.7–2.1)

## 2017-10-23 NOTE — Telephone Encounter (Signed)
Scheduled appt per 6/18 los - gave patient aVS and calender per los.

## 2017-10-24 ENCOUNTER — Encounter: Payer: Self-pay | Admitting: Internal Medicine

## 2017-10-24 ENCOUNTER — Ambulatory Visit (INDEPENDENT_AMBULATORY_CARE_PROVIDER_SITE_OTHER): Payer: PPO | Admitting: Internal Medicine

## 2017-10-24 VITALS — BP 126/66 | HR 86 | Temp 97.8°F | Ht 67.0 in | Wt 333.8 lb

## 2017-10-24 DIAGNOSIS — E1122 Type 2 diabetes mellitus with diabetic chronic kidney disease: Secondary | ICD-10-CM | POA: Diagnosis not present

## 2017-10-24 DIAGNOSIS — IMO0002 Reserved for concepts with insufficient information to code with codable children: Secondary | ICD-10-CM

## 2017-10-24 DIAGNOSIS — D631 Anemia in chronic kidney disease: Secondary | ICD-10-CM | POA: Diagnosis not present

## 2017-10-24 DIAGNOSIS — F329 Major depressive disorder, single episode, unspecified: Secondary | ICD-10-CM | POA: Diagnosis not present

## 2017-10-24 DIAGNOSIS — Z794 Long term (current) use of insulin: Secondary | ICD-10-CM

## 2017-10-24 DIAGNOSIS — N183 Chronic kidney disease, stage 3 unspecified: Secondary | ICD-10-CM

## 2017-10-24 DIAGNOSIS — I2510 Atherosclerotic heart disease of native coronary artery without angina pectoris: Secondary | ICD-10-CM | POA: Diagnosis not present

## 2017-10-24 DIAGNOSIS — M353 Polymyalgia rheumatica: Secondary | ICD-10-CM | POA: Diagnosis not present

## 2017-10-24 DIAGNOSIS — E1121 Type 2 diabetes mellitus with diabetic nephropathy: Secondary | ICD-10-CM

## 2017-10-24 DIAGNOSIS — E538 Deficiency of other specified B group vitamins: Secondary | ICD-10-CM | POA: Diagnosis not present

## 2017-10-24 DIAGNOSIS — E878 Other disorders of electrolyte and fluid balance, not elsewhere classified: Secondary | ICD-10-CM

## 2017-10-24 DIAGNOSIS — E1165 Type 2 diabetes mellitus with hyperglycemia: Secondary | ICD-10-CM

## 2017-10-24 DIAGNOSIS — I252 Old myocardial infarction: Secondary | ICD-10-CM | POA: Diagnosis not present

## 2017-10-24 DIAGNOSIS — I130 Hypertensive heart and chronic kidney disease with heart failure and stage 1 through stage 4 chronic kidney disease, or unspecified chronic kidney disease: Secondary | ICD-10-CM | POA: Diagnosis not present

## 2017-10-24 DIAGNOSIS — J449 Chronic obstructive pulmonary disease, unspecified: Secondary | ICD-10-CM | POA: Diagnosis not present

## 2017-10-24 DIAGNOSIS — I5032 Chronic diastolic (congestive) heart failure: Secondary | ICD-10-CM

## 2017-10-24 DIAGNOSIS — G894 Chronic pain syndrome: Secondary | ICD-10-CM | POA: Diagnosis not present

## 2017-10-24 DIAGNOSIS — C50919 Malignant neoplasm of unspecified site of unspecified female breast: Secondary | ICD-10-CM | POA: Diagnosis not present

## 2017-10-24 DIAGNOSIS — I5033 Acute on chronic diastolic (congestive) heart failure: Secondary | ICD-10-CM | POA: Diagnosis not present

## 2017-10-24 DIAGNOSIS — D509 Iron deficiency anemia, unspecified: Secondary | ICD-10-CM

## 2017-10-24 LAB — VITAMIN D 25 HYDROXY (VIT D DEFICIENCY, FRACTURES): Vit D, 25-Hydroxy: 30.9 ng/mL (ref 30.0–100.0)

## 2017-10-24 NOTE — Progress Notes (Signed)
Patient ID: [name], female   DOB: 12/07/1934, 83 y.o.   MRN: [id number]   Location:  [place] Place of Service:  OFFICE Provider: DR Arletha Grippe  Code Status:  Goals of Care:  Advanced Directives 10/09/2017  Does Patient Have a Medical Advance Directive? Yes  Type of Advance Directive Living will  Does patient want to make changes to medical advance directive? No - Patient declined  Copy of [place] in Chart? -  Would patient like information on creating a medical advance directive? -  Pre-existing out of facility DNR order (yellow form or pink MOST form) -     Chief Complaint  Patient presents with  . Transitions Of Care    Patient here today with her daughter [name]. 6/3 she was admitted to [facility] due to SOB and discharged on the 5th. No specific concerns.     HPI: Patient is a 83 y.o. female seen today for hospital follow-up s/p admission from [facility] for acute/chronic dHF, hypomagnesemia, hx DVT, DM, HTN, CKD, AOCD, hx breast CA, dementia, morbid obesity, FTT with hx falls. CXR revealed low grade CHF and was tx with IV lasix. Cardio consulted and recommended prn lasix at d/c. Mg repleted. A1c 9.8% (prev 6.7%); WBC peaked 13.1K-->12.1K; Hgb 8.1; BNP 104.4; TSH 0.884; Mg 1.2-->1.5; Cr 1.26-->1.47; albumin 2.8; B12 level 351 (prev 570) at d/c.   TOC call completed 10/12/17  Today she reports SOB improved since d/c. She takes lasix 2 times daily instead of daily as instructions read. LE edema better. She is a poor historian due to memory loss. Hx obtained from chart. She has numbness/tingling in feet   Past Medical History:  Diagnosis Date  . Allergy    takes Mucinex daily as needed  . Anemia, unspecified   . Anxiety    takes Clonazepam daily as needed  . Arthritis   . CHF (congestive heart failure) (HCC)    takes Furosemide daily  . Coronary artery disease    a. s/p IMI 2004 tx with BMS to RCA;  b. s/p Promus DES to RCA 2/12 c. 06/2014 High risk stress test  follow up cath 06/2014 showd patent stent--< med therapy for other mild to moedrate disease   . Coronary atherosclerosis of native coronary artery   . Depression    takes Cymbalta daily  . Diabetes mellitus    insulin daily  . Dysuria   . GERD (gastroesophageal reflux disease)    takes Protonix daily  . Gout, unspecified    takes Colchicine daily  . Headache    occasionally  . History of blood clots about 14yr ago   in legs-takes Coumadin   . Hyperlipidemia    takes Pravastatin daily  . Hypertension    takes Imdur and Metoprolol daily  . Insomnia   . Joint pain   . Joint swelling   . [place]ocardial infarction ([place]  . Numbness   . Obstructive sleep apnea   . Osteoarthritis   . Osteoarthritis   . Osteoarthrosis, unspecified whether generalized or localized, lower leg   . Pain, chronic   . [place]algia [place]umatica ([place]   . Pulmonary emboli ([place] 9/13   felt to need lifelong anticoagulation  . Shortness of breath dyspnea    with exertion and has Albuterol inhaler prn  . Type II or unspecified type diabetes mellitus without mention of complication, uncontrolled   . Urinary incontinence    takes Linzess daily  . Vertigo  hx of;was taking Meclizine if needed    Past Surgical History:  Procedure Laterality Date  . ABDOMINAL HYSTERECTOMY     partial  . APPENDECTOMY    . blood clots/legs and lungs  2013  . BREAST BIOPSY Left 07/22/2014  . BREAST BIOPSY Left 02/10/2013  . BREAST LUMPECTOMY Left 11/05/2014  . BREAST LUMPECTOMY WITH RADIOACTIVE SEED LOCALIZATION Left 11/05/2014   Procedure: LEFT BREAST LUMPECTOMY WITH RADIOACTIVE SEED LOCALIZATION;  Surgeon: [name], MD;  Location: [place];  Service: General;  Laterality: Left;  . CARDIAC CATHETERIZATION    . COLONOSCOPY    . CORONARY ANGIOPLASTY  2  . ESOPHAGOGASTRODUODENOSCOPY (EGD) WITH PROPOFOL N/A 11/07/2016   Procedure: ESOPHAGOGASTRODUODENOSCOPY (EGD) WITH PROPOFOL;  Surgeon: Gatha Mayer, MD;  Location: [facility]  ENDOSCOPY;  Service: Endoscopy;  Laterality: N/A;  . EXCISION OF SKIN TAG Right 11/05/2014   Procedure: EXCISION OF RIGHT EYELID SKIN TAG;  Surgeon: [name], MD;  Location: [place];  Service: General;  Laterality: Right;  . EYE SURGERY Bilateral    cataract   . GASTRIC BYPASS  1977    reversed in 1979, [place] N/A 06/29/2014   Procedure: LEFT HEART CATHETERIZATION WITH CORONARY ANGIOGRAM;  Surgeon: Troy Sine, MD;  Location: [facility] CATH LAB;  Service: Cardiovascular;  Laterality: N/A;  . MI with stent placement  2004    Allergies  Allergen Reactions  . Sulfonamide Derivatives Swelling    Mouth swelling  . Tramadol Nausea And Vomiting  . Aricept [Donepezil Hcl]     GI upset/loose stools    Outpatient Encounter Medications as of 10/24/2017  Medication Sig  . albuterol (PROVENTIL HFA;VENTOLIN HFA) 108 (90 Base) MCG/ACT inhaler Inhale 1-2 puffs into the lungs every 6 (six) hours as needed for wheezing or shortness of breath.  . anastrozole (ARIMIDEX) 1 MG tablet TAKE 1 TABLET BY MOUTH daily  . Biotin 10000 MCG TABS Take 1 tablet by mouth daily.  . budesonide-formoterol (SYMBICORT) 160-4.5 MCG/ACT inhaler Inhale 2 puffs into the lungs 2 (two) times daily. For bronchitis  . cetirizine (ZYRTEC) 10 MG tablet Take 10 mg by mouth at bedtime as needed for allergies.   . clonazePAM (KLONOPIN) 1 MG tablet TAKE 2 TABLETS BY MOUTH AT BEDTIME AS NEEDED FOR ANXIETY  . CRANBERRY EXTRACT PO Take 1 tablet by mouth daily.  . DULoxetine (CYMBALTA) 60 MG capsule Take one capsule by mouth once daily for anxiety  . estradiol (ESTRACE) 0.1 MG/GM vaginal cream Use every other evening use in vaginal area    Uses as needed  . furosemide (LASIX) 20 MG tablet Take 1 tablet (20 mg total) by mouth daily as needed for edema (shortness of breath, swelling in the legs). (Patient taking differently: Take 20 mg by mouth daily. Takes daily.)  . gabapentin  (NEURONTIN) 100 MG capsule Take 100 mg by mouth at bedtime.  [name] glucose blood (ONETOUCH VERIO) test strip USE 1 STRIP TO CHECK GLUCOSE THREE TIMES DAILY  E11.21  . hydrALAZINE (APRESOLINE) 25 MG tablet Take 1 tablet (25 mg total) by mouth 3 (three) times daily. (Patient taking differently: Take 25 mg by mouth 3 (three) times daily. Pt. takes twice daily because she gets up late every morning.)  . hydrOXYzine (ATARAX/VISTARIL) 10 MG tablet Take 1 tablet (10 mg total) by mouth 3 (three) times daily as needed for itching.  . insulin aspart (NOVOLOG FLEXPEN) 100 UNIT/ML FlexPen Before each meal 3 times a day, 140-199 -  2 units, 200-250 - 4 units, 251-299 - 6 units,  300-349 - 8 units,  350 or above 10 units. Insulin PEN if approved, provide syringes and needles if needed.  . Insulin Glargine (TOUJEO SOLOSTAR) 300 UNIT/ML SOPN Inject 35 Units into the skin 2 (two) times daily. (before breakfast and before supper)  . isosorbide mononitrate (IMDUR) 60 MG 24 hr tablet TAKE 1 TABLET BY MOUTH EVERY DAY  . Lancet Device MISC Onetouch verio machine lancets, check blood sugar twice daily as directed DX E11.69  . meclizine (ANTIVERT) 25 MG tablet Take one tablet by mouth three times daily as needed for dizziness  . memantine (NAMENDA) 5 MG tablet Take 5-10 mg by mouth See admin instructions. Take 2 tablets (71m) every AM and 1 tablet (538m every evening.  . metoprolol succinate (TOPROL-XL) 25 MG 24 hr tablet TAKE 1 TABLET BY MOUTH EVERY DAY FOR blood pressure  . nitroGLYCERIN (NITROSTAT) 0.4 MG SL tablet Take one tablet under the tongue every 5 minutes as needed for chest pain  . oxyCODONE-acetaminophen (PERCOCET) 10-325 MG tablet Take 1 tablet by mouth every 6 (six) hours.  . pantoprazole (PROTONIX) 40 MG tablet Take 1 tablet (40 mg total) by mouth daily.  . pravastatin (PRAVACHOL) 20 MG tablet TAKE 1 TABLET BY MOUTH EVERY DAY FOR cholesterol  . rivaroxaban (XARELTO) 10 MG TABS tablet Take 1 tablet (10 mg total)  by mouth daily.  . sennosides-docusate sodium (SENOKOT-S) 8.6-50 MG tablet Take 1 tablet by mouth daily as needed for constipation.  . sodium chloride (OCEAN) 0.65 % SOLN nasal spray Place 1 spray into both nostrils 3 (three) times daily as needed (for sinus congestion.). RINSE AS NEEDED FOR SINUS CONGESTION   . Turmeric 500 MG CAPS Take 1 tablet by mouth daily.  . Marland KitchenLORIC 40 MG tablet Take 1 tablet (40 mg total) by mouth daily.   No facility-administered encounter medications on file as of 10/24/2017.     Review of Systems:  Review of Systems  Unable to perform ROS: Dementia    Health Maintenance  Topic Date Due  . OPHTHALMOLOGY EXAM  04/25/2017  . MAMMOGRAM  08/16/2017  . INFLUENZA VACCINE  12/06/2017  . FOOT EXAM  12/12/2017  . HEMOGLOBIN A1C  04/10/2018  . TETANUS/TDAP  05/08/2018  . URINE MICROALBUMIN  06/14/2018  . DEXA SCAN  Completed  . PNA vac Low Risk Adult  Completed    Physical Exam: Vitals:   10/24/17 1140  Pulse: 86  SpO2: 91%  Weight: (!) 333 lb 12.8 oz (151.4 kg)  Height: '5\' 7"'  (1.702 m)   Body mass index is 52.28 kg/m. Physical Exam  Constitutional: She appears well-developed and well-nourished.  Morbidly obese in NAD  HENT:  Mouth/Throat: Oropharynx is clear and moist. No oropharyngeal exudate.  MMM; no oral thrush  Eyes: Pupils are equal, round, and reactive to light. No scleral icterus.  Neck: Neck supple. Carotid bruit is not present. No tracheal deviation present.  Cardiovascular: Normal rate, regular rhythm and intact distal pulses. Exam reveals no gallop and no friction rub.  Murmur (1/6 SEM) heard. Trace LE edema b/l. No calf TTP  Pulmonary/Chest: Effort normal and breath sounds normal. No stridor. No respiratory distress. She has no wheezes. She has no rales.  reduced BS at base b/l  Abdominal: Soft. Normal appearance and bowel sounds are normal. She exhibits distension. She exhibits no mass. There is no hepatomegaly. There is no tenderness.  There is no rigidity, no rebound and no  guarding. No hernia.  obese  Musculoskeletal: She exhibits edema.  Lymphadenopathy:    She has no cervical adenopathy.  Neurological: She is alert. She has normal reflexes.  Skin: Skin is warm and dry. No rash noted.  Psychiatric: She has a normal mood and affect. Her behavior is normal. Thought content normal.    Labs reviewed: Basic Metabolic Panel: Recent Labs    10/09/17 0049 10/09/17 0715 10/10/17 0448 10/23/17 1418  NA  --  139 134* 134*  K  --  3.8 4.1 4.5  CL  --  99* 101 98  CO2  --  '29 28 27  ' GLUCOSE  --  214* 273* 244*  BUN  --  22* 25* 35*  CREATININE  --  1.23* 1.47* 2.24*  CALCIUM  --  9.1 8.4* 9.0  MG  --  1.2* 1.5*  --   PHOS  --  3.7  --   --   TSH 0.884  --   --   --    Liver Function Tests: Recent Labs    07/25/17 1815 10/09/17 0715 10/23/17 1418  AST 23 12* 13  ALT 14 11* 7  ALKPHOS 83 76 85  BILITOT 0.4 0.5 0.4  PROT 8.8* 8.0 7.8  ALBUMIN 3.1* 2.8* 2.8*   No results for input(s): LIPASE, AMYLASE in the last 8760 hours. No results for input(s): AMMONIA in the last 8760 hours. CBC: Recent Labs    07/28/17 0507 08/02/17 1346  10/09/17 0715 10/10/17 0448 10/23/17 1418  WBC 11.1* 10.1   < > 13.1* 12.1* 11.6*  NEUTROABS 6.5 6,929  --   --   --  7.4*  HGB 7.9* 8.2*   < > 8.7* 8.1* 8.5*  HCT 27.0* 25.5*   < > 29.0* 27.3* 28.4*  MCV 86.0 79.9*   < > 81.9 82.5 82.6  PLT 317 304   < > 337 324 317   < > = values in this interval not displayed.   Lipid Panel: Recent Labs    01/16/17 1516 06/08/17 1015  CHOL 114 92  HDL 38* 38*  LDLCALC 53 37  TRIG 149 89  CHOLHDL 3.0 2.4   Lab Results  Component Value Date   HGBA1C 9.8 (H) 10/09/2017    Procedures since last visit: Dg Chest 2 View  Result Date: 10/08/2017 CLINICAL DATA:  Chest pressure and shortness of breath for the past day. History of COPD, CHF, previous MI, nonsmoker. EXAM: CHEST - 2 VIEW COMPARISON:  PA and lateral chest x-ray of July 25, 2017 FINDINGS: The lungs are borderline hypoinflated. The right hemidiaphragm is slightly higher than the left. The interstitial markings are coarse and slightly more conspicuous overall today. The cardiac silhouette is mildly enlarged. The pulmonary vascularity is not engorged. There is calcification in the wall of the aortic arch. There is multilevel degenerative disc disease of the thoracic spine. IMPRESSION: Mild interstitial prominence may reflect low-grade CHF. There are underlying chronic bronchitic changes. No alveolar pneumonia. Thoracic aortic atherosclerosis. Electronically Signed   By: David  Martinique M.D.   On: 10/08/2017 13:42    Assessment/Plan   ICD-10-CM   1. Electrolyte disturbance E87.8 BMP with eGFR([facility])    Magnesium  2. Type 2 diabetes mellitus with stage 3 chronic kidney disease, with long-term current use of insulin (HCC) E11.22    N18.3    Z79.4   3. Microcytic anemia D50.9   4. CKD (chronic kidney disease) stage 3, GFR 30-59 ml/min (HCC) N18.3 BMP  with eGFR([facility])  5. Chronic diastolic congestive heart failure (HCC) I50.32   6. B12 deficiency E53.8    INCREASE TOUJEO 38 UNITS 2 TIMES DAILY  CONTINUE SLIDING SCALE INSULIN  REDUCE LASIX (FUROSEMIDE) TO DAILY  START OTC VITAMIN B12 SUPPLEMENT 1000 MCG DAILY  Will call with lab results  Follow up as scheduled or sooner if need be    [facility] S. Perlie Gold  [facility] [address] [place],  [address] [phone] Cell (Monday-Friday 8 AM - 5 PM) [phone] After 5 PM and follow prompts

## 2017-10-24 NOTE — Patient Instructions (Addendum)
INCREASE TOUJEO 38 UNITS 2 TIMES DAILY  CONTINUE SLIDING SCALE INSULIN  REDUCE LASIX (FUROSEMIDE) TO DAILY  START OTC VITAMIN B12 SUPPLEMENT 1000 MCG DAILY  Will call with lab results  Follow up as scheduled or sooner if need be

## 2017-10-25 ENCOUNTER — Telehealth: Payer: Self-pay

## 2017-10-25 ENCOUNTER — Telehealth: Payer: Self-pay | Admitting: Cardiology

## 2017-10-25 ENCOUNTER — Other Ambulatory Visit: Payer: Self-pay

## 2017-10-25 LAB — BASIC METABOLIC PANEL WITH GFR
BUN/Creatinine Ratio: 20 (calc) (ref 6–22)
BUN: 37 mg/dL — ABNORMAL HIGH (ref 7–25)
CO2: 27 mmol/L (ref 20–32)
Calcium: 8.8 mg/dL (ref 8.6–10.4)
Chloride: 98 mmol/L (ref 98–110)
Creat: 1.86 mg/dL — ABNORMAL HIGH (ref 0.60–0.88)
GFR, Est African American: 29 mL/min/{1.73_m2} — ABNORMAL LOW (ref >=60)
GFR, Est Non African American: 25 mL/min/{1.73_m2} — ABNORMAL LOW (ref >=60)
Glucose, Bld: 211 mg/dL — ABNORMAL HIGH (ref 65–99)
Potassium: 5.4 mmol/L — ABNORMAL HIGH (ref 3.5–5.3)
Sodium: 133 mmol/L — ABNORMAL LOW (ref 135–146)

## 2017-10-25 LAB — MAGNESIUM: Magnesium: 1.4 mg/dL — ABNORMAL LOW (ref 1.5–2.5)

## 2017-10-25 MED ORDER — MAGNESIUM OXIDE 400 MG PO TABS: 400.0000 mg | ORAL_TABLET | Freq: Every day | ORAL | 6 refills | Status: DC

## 2017-10-25 NOTE — Telephone Encounter (Signed)
New Message   [name] a Nurse care coordinator os requesting to speak with a nures

## 2017-10-25 NOTE — Telephone Encounter (Signed)
-----   Message from [place], [place] sent at 10/25/2017  3:10 PM EDT ----- Kidney function improved; magnesium level still low - START MAGNESIUM OXIDE 400MG  DAILY #30 WITH 6 RF; continue other medications as ordered

## 2017-10-25 NOTE — Patient Outreach (Signed)
[facility] [facility]) Care Management   10/25/2017  MONSERRATT [name] 01/09/1935 [id number]  [name] is an 83 y.o. female  Subjective: " I feel alright. I feel like I have more shortness of breath. the swelling in my feet. It is like it is all starting back up again".  Objective:  BP (!) 142/70   Pulse 84   Resp 20   SpO2 94%   Review of Systems  Respiratory:       Lungs clear  Cardiovascular:       S1S2 noted, regular, 3+ lower extremity edema to feet/ankles/    Physical Exam skin warm dry, color within normal limits.  Encounter Medications:   Outpatient Encounter Medications as of 10/25/2017  Medication Sig  . albuterol (PROVENTIL HFA;VENTOLIN HFA) 108 (90 Base) MCG/ACT inhaler Inhale 1-2 puffs into the lungs every 6 (six) hours as needed for wheezing or shortness of breath.  . anastrozole (ARIMIDEX) 1 MG tablet TAKE 1 TABLET BY MOUTH daily  . Biotin 10000 MCG TABS Take 1 tablet by mouth daily.  . clonazePAM (KLONOPIN) 1 MG tablet TAKE 2 TABLETS BY MOUTH AT BEDTIME AS NEEDED FOR ANXIETY  . DULoxetine (CYMBALTA) 60 MG capsule Take one capsule by mouth once daily for anxiety  . gabapentin (NEURONTIN) 100 MG capsule Take 100 mg by mouth at bedtime.  . hydrALAZINE (APRESOLINE) 25 MG tablet Take 1 tablet (25 mg total) by mouth 3 (three) times daily. (Patient taking differently: Take 25 mg by mouth 3 (three) times daily. Pt. takes twice daily because she gets up late every morning.)  . isosorbide mononitrate (IMDUR) 60 MG 24 hr tablet TAKE 1 TABLET BY MOUTH EVERY DAY  . meclizine (ANTIVERT) 25 MG tablet Take one tablet by mouth three times daily as needed for dizziness  . memantine (NAMENDA) 5 MG tablet Take 5-10 mg by mouth See admin instructions. Take 2 tablets (27m) every AM and 1 tablet (530m every evening.  . metoprolol succinate (TOPROL-XL) 25 MG 24 hr tablet TAKE 1 TABLET BY MOUTH EVERY DAY FOR blood pressure  . oxyCODONE-acetaminophen (PERCOCET) 10-325 MG  tablet Take 1 tablet by mouth every 6 (six) hours.  . pantoprazole (PROTONIX) 40 MG tablet Take 1 tablet (40 mg total) by mouth daily.  . pravastatin (PRAVACHOL) 20 MG tablet TAKE 1 TABLET BY MOUTH EVERY DAY FOR cholesterol  . rivaroxaban (XARELTO) 10 MG TABS tablet Take 1 tablet (10 mg total) by mouth daily.  . Turmeric 500 MG CAPS Take 1 tablet by mouth daily.  . Marland KitchenLORIC 40 MG tablet Take 1 tablet (40 mg total) by mouth daily.  . budesonide-formoterol (SYMBICORT) 160-4.5 MCG/ACT inhaler Inhale 2 puffs into the lungs 2 (two) times daily. For bronchitis  . cetirizine (ZYRTEC) 10 MG tablet Take 10 mg by mouth at bedtime as needed for allergies.   . Marland KitchenRANBERRY EXTRACT PO Take 1 tablet by mouth daily.  . Marland Kitchenstradiol (ESTRACE) 0.1 MG/GM vaginal cream Use every other evening use in vaginal area    Uses as needed  . furosemide (LASIX) 20 MG tablet Take 1 tablet (20 mg total) by mouth daily as needed for edema (shortness of breath, swelling in the legs). (Patient taking differently: Take 20 mg by mouth daily. Takes daily.)  . glucose blood (ONETOUCH VERIO) test strip USE 1 STRIP TO CHECK GLUCOSE THREE TIMES DAILY  E11.21  . hydrOXYzine (ATARAX/VISTARIL) 10 MG tablet Take 1 tablet (10 mg total) by mouth 3 (three) times daily as needed for  itching.  . insulin aspart (NOVOLOG FLEXPEN) 100 UNIT/ML FlexPen Before each meal 3 times a day, 140-199 - 2 units, 200-250 - 4 units, 251-299 - 6 units,  300-349 - 8 units,  350 or above 10 units. Insulin PEN if approved, provide syringes and needles if needed.  . Insulin Glargine (TOUJEO SOLOSTAR) 300 UNIT/ML SOPN Inject 38 Units into the skin 2 (two) times daily. (before breakfast and before supper)  . Lancet Device MISC Onetouch verio machine lancets, check blood sugar twice daily as directed DX E11.69  . nitroGLYCERIN (NITROSTAT) 0.4 MG SL tablet Take one tablet under the tongue every 5 minutes as needed for chest pain  . sennosides-docusate sodium (SENOKOT-S) 8.6-50  MG tablet Take 1 tablet by mouth daily as needed for constipation.  . sodium chloride (OCEAN) 0.65 % SOLN nasal spray Place 1 spray into both nostrils 3 (three) times daily as needed (for sinus congestion.). RINSE AS NEEDED FOR SINUS CONGESTION    No facility-administered encounter medications on file as of 10/25/2017.     Functional Status:   In your present state of health, do you have any difficulty performing the following activities: 10/25/2017 10/09/2017  Hearing? Y Y  Comment - -  Vision? Y Y  Comment - -  Difficulty concentrating or making decisions? [name]  Walking or climbing stairs? Y Y  Dressing or bathing? Y Y  Doing errands, shopping? Y Y  Preparing Food and eating ? - -  Using the Toilet? - -  In the past six months, have you accidently leaked urine? - -  Do you have problems with loss of bowel control? - -  Managing your Medications? - -  Managing your Finances? - -  Housekeeping or managing your Housekeeping? - -  Some recent data might be hidden    Fall/Depression Screening:    Fall Risk  10/25/2017 10/24/2017 08/17/2017  Falls in the past year? Yes No Yes  Number falls in past yr: 2 or more - 2 or more  Comment - - -  Injury with Fall? No - -  Comment - - -  Risk Factor Category  High Fall Risk - High Fall Risk  Risk for fall due to : History of fall(s);Impaired mobility - History of fall(s);Impaired balance/gait;Impaired mobility  Follow up Falls prevention discussed - Education provided;Falls prevention discussed   PHQ 2/9 Scores 10/25/2017 08/17/2017 06/25/2017 06/19/2017 06/08/2017 04/02/2017 03/24/2016  PHQ - 2 Score 3 - 0 0 0 3 3  PHQ- 9 Score 7 - - - - 10 17  Exception Documentation - Other- indicate reason in comment box - - - - -  Not completed - spoke with daughter, did not speak with patient - - - - -    Assessment:  Referral received from hospital liaison. Patient hospitalized 6/3-10/10/17 with heart failure exacerbation. History of breast cancer, HTN,  COPD,DM, forgetful.   Home visit completed. Present during visit client's daughter, [name] and [name], Surveyor, quantity.  client acknowledges signs/symptoms of depression. What is my purpose, no I am not interested in a whole lot.   Medications reviewed. Client received medication in blister package from pharmacy. However the latest medications are not in the blister package. Per daughter client's lasix ordered daily as needed and states has been taking every day.  Primary care visit follow up visit completed on yesterday.   Recent admission for heart failure. Client is concerned about the increased swelling of her feet. And states she has noticed  some increased shortness of breath since discharge. Next cardiologist appointment scheduled for July 11.  RNCM called cardiologist office to see if client could be seen sooner. Spoke with [employer]. Appointment rescheduled for June 25 at [facility] participated in home visit through speaker phone and encouraged client to maintain low sodium diet and weigh self. She encouraged client to follow up with primary care provider regarding follow up of lab work.  Primary care staff, Mickey Farber called client while RNCM was present. Per primary care client was instructed to begin Magnesium and prescription sent to pharmacy. RNCM also reinforced client's report of increased edema in feet and client's reports of increased shortness of breath with Mickey Farber. Confirmed that client is to take the furosemide as needed and if she had increased swelling to take the furosemide one tablet/day as needed.  Client to limit sodium-reviewed foods that are high in sodium. EMMI article provided Showed daughter how to read sodium content on labels.  Medications reviewed. Daughter reports client has not been taking her blood pressure medication three times/day. Because client does not get up until around noon. Daughter to begin giving client her first dose of hydralazine when she  wakes up to go to the bathroom and also weigh at that time with the assistance of her daughter around 7am.  Fall prevention discussed.  Plan: follow up telephonically next week, social work referral for depression, pharmacy consult regarding medication management/adherence. [facility] CM Care Plan Problem One     Most Recent Value  Care Plan Problem One  at risk for readmission as evidence of recent hospitalization.  Role Documenting the Problem One  Care Management [place] for Problem One  Active  [facility] Long Term Goal   client will not have readmission within the next 31-45 days.  [facility] Long Term Goal Start Date  10/15/17  Interventions for Problem One Long Term Goal  discussed importance of taking medications as prescribed. discussed importance of weights and following up with provider appointments.  [facility] CM Short Term Goal #1   client/family will verbalize start of care with home health agency within the next 1-2 weeks.  [facility] CM Short Term Goal #1 Start Date  10/15/17  [facility] CM Short Term Goal #1 Met Date  10/25/17  Interventions for Short Term Goal #1  confirmed start of care  [facility] CM Short Term Goal #2   client will begin to weigh self daily within the next 1-2 weeks.  [facility] CM Short Term Goal #2 Start Date  10/15/17  Interventions for Short Term Goal #2  reinforced the importance of daily weights.    [facility] CM Care Plan Problem Two     Most Recent Value  Care Plan Problem Two  medication adherence  Role Documenting the Problem Two  Care Management [place] for Problem Two  Active  [facility] CM Short Term Goal #1   client will take hydralazine as ordered.  [facility] CM Short Term Goal #1 Start Date  10/25/17  Interventions for Short Term Goal #2   pharmacy referral, discussed importance of taking medications as prescribed, called primary care and cardiology to confirm diuretic regimen.     Thea Silversmith, RN, MSN, [place] Coordinator Cell: [phone]

## 2017-10-25 NOTE — Telephone Encounter (Signed)
Discussed with patient results. Rx sent to pharm.([facility] on  [address] as requested by patient). Patient will be seeing cardiologist next week. Discussed with patient to use the lasix since she is having some swelling in her legs.

## 2017-10-25 NOTE — Telephone Encounter (Signed)
Denton Brick RN with [facility] is calling in requesting that this pts post hospital follow-up appt be moved up to a sooner date than 7/11.  Pt was recently admitted for CHF exacerbation.  Pt was last seen by Dr Meda Coffee back in 2016.  Pt saw here PCP yesterday, and was advised on PRN lasix and labs were drawn at that time.  Per Denton Brick, she feels if the pt isn't seen in a timely manner, she will end up back in the hospital.  Per Denton Brick, the pt is having slight increase in lower extremity edema, and DOE. Denton Brick is present with the pt and states she is stable and does not need to go back to the hospital at this time.  Denton Brick was also requesting increase in the pts lasix. Humberto Seals RN to call the pts PCP, Dr Juleen China, for she drew labs on her yesterday, and would safely be able to advise on increase in lasix as needed, due to pt having increased Creat and BUN 2 days ago. Rescheduled and moved the pts post-hospital follow-up appt to next Tuesday 6/25 at 0900 with Truitt Merle NP.  Humberto Seals RN and the pt that she needs to maintain a very low sodium diet (for RN reports she's been eating ham and processed foods), take her meds as directed, and if her symptoms worsen between now and next Tuesday, then she should refer to the hospital.  Also advised the pt to monitor her dry weights daily, log this information, and bring those values to her appt with Truitt Merle NP.   Also advised the pt to touch base with her PCP, for she just resulted her labs and has recommendations and med changes to give her.  Informed the pt that I will route this communication to Dr Meda Coffee as a general FYI.  All parties verbalized understanding and agrees with this plan.

## 2017-10-30 ENCOUNTER — Encounter: Payer: Self-pay | Admitting: *Deleted

## 2017-10-30 ENCOUNTER — Ambulatory Visit: Payer: PPO | Admitting: Nurse Practitioner

## 2017-10-30 ENCOUNTER — Encounter: Payer: Self-pay | Admitting: Nurse Practitioner

## 2017-10-30 ENCOUNTER — Telehealth: Payer: Self-pay | Admitting: Pharmacist

## 2017-10-30 ENCOUNTER — Other Ambulatory Visit: Payer: Self-pay | Admitting: *Deleted

## 2017-10-30 VITALS — BP 170/90 | HR 82 | Ht 67.0 in | Wt 327.8 lb

## 2017-10-30 DIAGNOSIS — I5032 Chronic diastolic (congestive) heart failure: Secondary | ICD-10-CM | POA: Diagnosis not present

## 2017-10-30 DIAGNOSIS — I10 Essential (primary) hypertension: Secondary | ICD-10-CM

## 2017-10-30 MED ORDER — FERROUS GLUCONATE 324 (38 FE) MG PO TABS: 324.0000 mg | ORAL_TABLET | Freq: Every day | ORAL | 3 refills | Status: DC

## 2017-10-30 MED ORDER — HYDRALAZINE HCL 50 MG PO TABS: 50.0000 mg | ORAL_TABLET | Freq: Two times a day (BID) | ORAL | 6 refills | Status: DC

## 2017-10-30 NOTE — Patient Instructions (Addendum)
We will be checking the following labs today - NONE   Medication Instructions:    Continue with your current medicines. BUT  Increase Hydralazine to 50 mg three times a day  Start Iron (ferrous sulfate) 324 mg daily - this is over the counter.    Testing/Procedures To Be Arranged:  N/A  Follow-Up:   See me in 3 to 4 months    Other Special Instructions:   Call Dr. Grier Mitts office to find out about scheduling of CT scans.     If you need a refill on your cardiac medications before your next appointment, please call your pharmacy.   Call the [place] office at [phone] if you have any questions, problems or concerns.

## 2017-10-30 NOTE — Progress Notes (Signed)
CARDIOLOGY OFFICE NOTE  Date:  10/30/2017    [name] Date of Birth: 15-Jan-1935 Medical Record #[id number]  PCP:  Gildardo Cranker, DO  Cardiologist:  Meda Coffee   Chief Complaint  Patient presents with  . Shortness of Breath    Post hospital visit - seen for Dr. Meda Coffee    History of Present Illness: [name] is a 83 y.o. female who presents today for a post hospital visit.  Seen for Dr. Meda Coffee. She has been seen by Dr. [name] in the past.   She has multiple issues which include known CAD with past inferior MI in 2004. She has had bare-metal stent to the RCA in 2004 and drug-eluting stent placement to the RCA in 06/2010 in the setting of unstable angina with overall preserved LV function. Last cath in 2012 showing otherwise nonobstructive disease. Her other problems include morbid obesity, severe OA - basically confined to a wheelchair, COPD, DM, HTN, HLD, incontinence, OSA, polymyalgia rheumatica, anxiety, dementia, depression and gallbladder disease. She is on chronic anticoagulation for DVT/PE since September of 2013. Echo last September 2013 was a TDS but with a normal EF noted  In February 2016, she developed increasing episodes of exertional shortness of breath. A nuclear perfusion study was interpreted as high risk of inferior and anteroapical ischemia. Her Coumadin was held and she was referred to undergo cardiac catheterization. This was done on 06/29/2014 and showed normal global LV function with mild mid inferior hypocontractility. There was evidence for coronary calcification with smooth 20% proximal narrowing followed by 30% stenosis before the first diagonal branch, 50-60% stenosis in the proximal diagonal 1 vessel, 30% mid LAD smooth narrowing, and 20% smooth proximal left circumflex stenosis. Her RCA stents were widely patent.  She was recently found to have a left breast mass and underwent a pre op clearance visit with Dr. Claiborne Billings back in April of 2016.  Had her surgery in June per Dr. Rush Farmer.   She was last seen here in this office by me in July of 2016 - multiple GI complaints. Always short of breath.   Presented earlier this month to the hospital with progressive chest pain, shortness of breath and swelling. History quite hard to follow. BNP only 104. Treated for diastolic HF. Troponins were negative. Seen by cardiology. No further cardiac testing felt to be warranted. Compliance felt to be an issue. Has declined SNF placement.   Comes in today. Here with family. She is wheelchair bound. Not able to walk/stand. She remains short of breath. Unclear how she is taking her medicines. She has 2 bags along with "Pill Pack". Has not had medicines yet today - but daughter notes BP still running high. Only able to take the Hydralazine BID due to sleeping schedule. Daughter provides most of the care. [name] continues with some shortness of breath - seems to be at her baseline. Some swelling. Sits all day with legs down. Now trying to use less salt. Dr. Grier Mitts note was reviewed - daughter does not seem to know that she was to have her mom on iron, getting scans, and follow up visit. No actual chest pain. She is quite sedentary. Sleeps most of the day.   Past Medical History:  Diagnosis Date  . Allergy    takes Mucinex daily as needed  . Anemia, unspecified   . Anxiety    takes Clonazepam daily as needed  . Arthritis   . CHF (congestive heart failure) ([place])  takes Furosemide daily  . Coronary artery disease    a. s/p IMI 2004 tx with BMS to RCA;  b. s/p Promus DES to RCA 2/12 c. 06/2014 High risk stress test follow up cath 06/2014 showd patent stent--< med therapy for other mild to moedrate disease   . Coronary atherosclerosis of native coronary artery   . Depression    takes Cymbalta daily  . Diabetes mellitus    insulin daily  . Dysuria   . GERD (gastroesophageal reflux disease)    takes Protonix daily  . Gout, unspecified    takes  Colchicine daily  . Headache    occasionally  . History of blood clots about 70yrs ago   in legs-takes Coumadin   . Hyperlipidemia    takes Pravastatin daily  . Hypertension    takes Imdur and Metoprolol daily  . Insomnia   . Joint pain   . Joint swelling   . Myocardial infarction ([place]) 2004  . Numbness   . Obstructive sleep apnea   . Osteoarthritis   . Osteoarthritis   . Osteoarthrosis, unspecified whether generalized or localized, lower leg   . Pain, chronic   . Polymyalgia rheumatica ([place])   . Pulmonary emboli ([place]) 9/13   felt to need lifelong anticoagulation  . Shortness of breath dyspnea    with exertion and has Albuterol inhaler prn  . Type II or unspecified type diabetes mellitus without mention of complication, uncontrolled   . Urinary incontinence    takes Linzess daily  . Vertigo    hx of;was taking Meclizine if needed    Past Surgical History:  Procedure Laterality Date  . ABDOMINAL HYSTERECTOMY     partial  . APPENDECTOMY    . blood clots/legs and lungs  2013  . BREAST BIOPSY Left 07/22/2014  . BREAST BIOPSY Left 02/10/2013  . BREAST LUMPECTOMY Left 11/05/2014  . BREAST LUMPECTOMY WITH RADIOACTIVE SEED LOCALIZATION Left 11/05/2014   Procedure: LEFT BREAST LUMPECTOMY WITH RADIOACTIVE SEED LOCALIZATION;  Surgeon: [name], MD;  Location: [facility];  Service: General;  Laterality: Left;  . CARDIAC CATHETERIZATION    . COLONOSCOPY    . CORONARY ANGIOPLASTY  2  . ESOPHAGOGASTRODUODENOSCOPY (EGD) WITH PROPOFOL N/A 11/07/2016   Procedure: ESOPHAGOGASTRODUODENOSCOPY (EGD) WITH PROPOFOL;  Surgeon: Gatha Mayer, MD;  Location: [facility];  Service: Endoscopy;  Laterality: N/A;  . EXCISION OF SKIN TAG Right 11/05/2014   Procedure: EXCISION OF RIGHT EYELID SKIN TAG;  Surgeon: [name], MD;  Location: [facility];  Service: General;  Laterality: Right;  . EYE SURGERY Bilateral    cataract   . GASTRIC BYPASS  1977    reversed in 1979, [place] N/A 06/29/2014   Procedure: LEFT HEART CATHETERIZATION WITH CORONARY ANGIOGRAM;  Surgeon: Troy Sine, MD;  Location: [facility] CATH LAB;  Service: Cardiovascular;  Laterality: N/A;  . MI with stent placement  2004     Medications: Current Meds  Medication Sig  . albuterol (PROVENTIL HFA;VENTOLIN HFA) 108 (90 Base) MCG/ACT inhaler Inhale 1-2 puffs into the lungs every 6 (six) hours as needed for wheezing or shortness of breath.  . anastrozole (ARIMIDEX) 1 MG tablet TAKE 1 TABLET BY MOUTH daily  . Biotin 10000 MCG TABS Take 1 tablet by mouth daily.  . budesonide-formoterol (SYMBICORT) 160-4.5 MCG/ACT inhaler Inhale 2 puffs into the lungs 2 (two) times daily. For bronchitis  . Cholecalciferol (VITAMIN D3) 5000 units CAPS Take 1 capsule  by mouth daily.  . clonazePAM (KLONOPIN) 1 MG tablet TAKE 2 TABLETS BY MOUTH AT BEDTIME AS NEEDED FOR ANXIETY  . CRANBERRY EXTRACT PO Take 1 tablet by mouth daily.  . DULoxetine (CYMBALTA) 60 MG capsule Take one capsule by mouth once daily for anxiety  . estradiol (ESTRACE) 0.1 MG/GM vaginal cream Use every other evening use in vaginal area    Uses as needed  . furosemide (LASIX) 20 MG tablet Take 20 mg by mouth daily.  [name] gabapentin (NEURONTIN) 100 MG capsule Take 100 mg by mouth at bedtime.  [name] glucose blood (ONETOUCH VERIO) test strip USE 1 STRIP TO CHECK GLUCOSE THREE TIMES DAILY  E11.21  . hydrALAZINE (APRESOLINE) 25 MG tablet Take 25 mg by mouth 2 (two) times daily.  . hydrOXYzine (ATARAX/VISTARIL) 10 MG tablet Take 1 tablet (10 mg total) by mouth 3 (three) times daily as needed for itching.  . insulin aspart (NOVOLOG FLEXPEN) 100 UNIT/ML FlexPen Before each meal 3 times a day, 140-199 - 2 units, 200-250 - 4 units, 251-299 - 6 units,  300-349 - 8 units,  350 or above 10 units. Insulin PEN if approved, provide syringes and needles if needed.  . Insulin Glargine (TOUJEO SOLOSTAR) 300 UNIT/ML SOPN Inject 38 Units into  the skin 2 (two) times daily. (before breakfast and before supper)  . isosorbide mononitrate (IMDUR) 60 MG 24 hr tablet TAKE 1 TABLET BY MOUTH EVERY DAY  . Lancet Device MISC Onetouch verio machine lancets, check blood sugar twice daily as directed DX E11.69  . magnesium oxide (MAG-OX) 400 MG tablet Take 1 tablet (400 mg total) by mouth daily.  . meclizine (ANTIVERT) 25 MG tablet Take one tablet by mouth three times daily as needed for dizziness  . memantine (NAMENDA) 5 MG tablet Take 5-10 mg by mouth See admin instructions. Take 2 tablets (10mg ) every AM and 1 tablet (5mg ) every evening.  . metoprolol succinate (TOPROL-XL) 25 MG 24 hr tablet TAKE 1 TABLET BY MOUTH EVERY DAY FOR blood pressure  . montelukast (SINGULAIR) 10 MG tablet Take 10 mg by mouth at bedtime.  . nitroGLYCERIN (NITROSTAT) 0.4 MG SL tablet Take one tablet under the tongue every 5 minutes as needed for chest pain  . oxyCODONE-acetaminophen (PERCOCET) 10-325 MG tablet Take 1 tablet by mouth every 6 (six) hours.  . pantoprazole (PROTONIX) 40 MG tablet Take 1 tablet (40 mg total) by mouth daily.  . pravastatin (PRAVACHOL) 20 MG tablet TAKE 1 TABLET BY MOUTH EVERY DAY FOR cholesterol  . rivaroxaban (XARELTO) 10 MG TABS tablet Take 1 tablet (10 mg total) by mouth daily.  . Turmeric 500 MG CAPS Take 1 tablet by mouth daily.  [name] ULORIC 40 MG tablet Take 1 tablet (40 mg total) by mouth daily.  . vitamin B-12 (CYANOCOBALAMIN) 1000 MCG tablet Take 1,000 mcg by mouth daily.     Allergies: Allergies  Allergen Reactions  . Sulfonamide Derivatives Swelling    Mouth swelling  . Tramadol Nausea And Vomiting  . Aricept [Donepezil Hcl]     GI upset/loose stools    Social History: The patient  reports that she has never smoked. She has never used smokeless tobacco. She reports that she does not drink alcohol or use drugs.   Family History: [name] family history includes Arthritis in her father and sister; Breast cancer (age of  onset: 24) in her mother; Diabetes in her father and sister; Heart disease in her cousin and mother; Hypertension in her father; Obesity  in her sister; Throat cancer in her father.   Review of Systems: Please see the history of present illness.   Otherwise, the review of systems is positive for none.   All other systems are reviewed and negative.   Physical Exam: VS:  BP (!) 170/90 (BP Location: Left Wrist, Patient Position: Sitting, Cuff Size: Normal)   Pulse 82   Ht 5\' 7"  (1.702 m)   Wt (!) 327 lb 12.8 oz (148.7 kg)   SpO2 95% Comment: at rest  BMI 51.34 kg/m  .  BMI Body mass index is 51.34 kg/m.  Wt Readings from Last 3 Encounters:  10/30/17 (!) 327 lb 12.8 oz (148.7 kg)  10/24/17 (!) 333 lb 12.8 oz (151.4 kg)  10/23/17 (!) 332 lb 11.2 oz (150.9 kg)    General: Morbidly obese. Alert and in no acute distress. She is in a wheelchair.   HEENT: Normal. Missing teeth.  Neck: Supple, no JVD, carotid bruits, or masses noted.  Cardiac: Regular rate and rhythm. Heart tones are distant. +1 edema.  Respiratory:  Lungs are clear to auscultation bilaterally with normal work of breathing.  GI: Soft and nontender.  MS: No deformity or atrophy. Gait not tested.  Skin: Warm and dry. Color is normal.  Neuro:  Strength and sensation are intact and no gross focal deficits noted.  Psych: Alert, appropriate and with normal affect.   LABORATORY DATA:  EKG:  EKG is not ordered today.  Lab Results  Component Value Date   WBC 11.6 (H) 10/23/2017   HGB 8.5 (L) 10/23/2017   HCT 28.4 (L) 10/23/2017   PLT 317 10/23/2017   GLUCOSE 211 (H) 10/24/2017   CHOL 92 06/08/2017   TRIG 89 06/08/2017   HDL 38 (L) 06/08/2017   LDLCALC 37 06/08/2017   ALT 7 10/23/2017   AST 13 10/23/2017   NA 133 (L) 10/24/2017   K 5.4 (H) 10/24/2017   CL 98 10/24/2017   CREATININE 1.86 (H) 10/24/2017   BUN 37 (H) 10/24/2017   CO2 27 10/24/2017   TSH 0.884 10/09/2017   INR 2.6 05/21/2017   HGBA1C 9.8 (H)  10/09/2017   MICROALBUR 2.9 06/14/2017     BNP (last 3 results) Recent Labs    10/08/17 1302  BNP 104.4*    ProBNP (last 3 results) No results for input(s): PROBNP in the last 8760 hours.   Other Studies Reviewed Today:  Echo Study Conclusions 10/2017  - Procedure narrative: Transthoracic echocardiography. The study   was technically difficult, as a result of body habitus. - Left ventricle: The cavity size was normal. Wall thickness was   increased in a pattern of moderate LVH. There was severe   concentric hypertrophy. Systolic function was vigorous. The   estimated ejection fraction was in the range of 65% to 70%.   Doppler parameters are consistent with abnormal left ventricular   relaxation (grade 1 diastolic dysfunction). The E/e&' ratio is   between 8-15, suggesting indeterminate LV filling pressure. - Mitral valve: Poorly visualized. Calcified annulus. There was no   significant regurgitation. - Left atrium: The atrium was normal in size. - Inferior vena cava: The vessel was normal in size. The   respirophasic diameter changes were in the normal range (>= 50%),   consistent with normal central venous pressure.  Impressions:  - Technically difficult study. Compared to a prior study in 07/2017,   the LVEF is higher at 65-70%.   Echo 07/27/2017 Study Conclusions  - Left ventricle: The  cavity size was normal. Wall thickness was increased in a pattern of severe LVH. Systolic function was normal. The estimated ejection fraction was in the range of 60% to 65%. There is akinesis of the basalinferior myocardium. The study is not technically sufficient to allow evaluation of LV diastolic function. - Mitral valve: Severely calcified annulus.  Cath 06/29/2014 ANGIOGRAPHY:  Fluoroscopy revealed coronary calcification of left coronary system and RCA.  The left main coronary artery was a moderate-sized long vessel which wasangiographically normal and  bifurcated into the LAD and left circumflex coronary artery.   The LAD had proximal calcification. There was smooth proximal narrowing before the first septal perforating artery. There was 30% narrowing proximal to the takeoff of the first agonal vessel with 50-60% narrowing in the proximal portion of this diagonal and 30% narrowing in the LAD beyond the diagonal vessel.  The left circumflex coronary artery was a moderate size vessel that had smooth 20% proximal narrowing prior to giving off one major bifurcating marginal branch.  The RCA was large caliber vessel. There was a patent stent in the proximal RCA and a patent stent in the mid RCA. The remainder of the RCA was free of significant disease. The vessel supplied a moderate size PDA and smaller PLA vessel.  Left ventriculography revealed normal global LV contractility with mild mid inferior hypocontractility. There was no evidence for mitral regurgitation. Ejection fraction is 55-60%.  IMPRESSION:  Normal global LV function with mild mid inferior hypocontractility.  Coronary calcification with smooth 20% proximal narrowing followed by 30% stenosis before the first diagonal branch, 50-60% stenosis in the proximal diagonal 1 vessel with 30% mid LAD smooth narrowing; 20% smooth stenosis in the proximal left circumflex: Artery, and widely patent proximal and mid RCA stents without significant RCA stenoses.  RECOMMENDATION:  Medical therapy.    Assessment/Plan:  1. Recent admission for presumed acute diastolic HF - has had excessive salt intake - underlying pulmonary disease - also with anemia - all contributing factors - weight is down - seems to be more at her baseline. Encouraged to continue to restrict salt. Needs better BP control.   2.  Chest pain with history of CAD and prior stenting - recent admission noted - history not felt to be reliable at that time - EKG was negative - no further ischemic work up planned -  would manage medically - not on aspirin due to anticoagulation. I would favor a conservative approach.   3.  Hypertension - no medicines yet today - not able to take Hydralazine TID due to sleeping but still elevated according to the daughter. Increasing Hydralazine to 50 mg BID today   4. Uncontrolled DM - as evidenced by recent A1C - plan per PCP  5. Morbid obesity - I do not see this changing.   6. ? Of non compliance - daughter seems quite engaged with mom's care.   7. CKD - not able to have ACE/ARB  8. Prior PE/DVT - on Xarelto. Remains anemiac  9. Noted failure to thrive - has declined SNF - unfortunately, do not see this changing.   10 Dementia  11. Anemia - reviewed Dr Grier Mitts note - patient is not on iron. No scans scheduled yet - daughter will be calling Oncology. She does remain on Xarelto as well.   Current medicines are reviewed with the patient today.  The patient does not have concerns regarding medicines other than what has been noted above.  The following changes have been made:  See above.  Labs/ tests ordered today include:   No orders of the defined types were placed in this encounter.    Disposition:   FU with me in 3 to 4 months.  Her overall prognosis felt to be quite poor in my opinion.   Patient is agreeable to this plan and will call if any problems develop in the interim.   SignedTruitt Merle, NP  10/30/2017 10:00 AM  [facility] [address] [place], [place]  [address] Phone: [phone] Fax: [phone]

## 2017-10-30 NOTE — Telephone Encounter (Signed)
-----   Message from Jiles Harold sent at 10/29/2017  9:45 AM EDT ----- Regarding: Referral: Order for Consuelo Pandy  Referral from Thea Silversmith, RN  "Please see below request"  Forde Radon ----- Message ----- From: Luretha Rued, RN Sent: 10/26/2017   7:08 PM To: Thn Cm Communication Orders Subject: Order for LACRESIA, DARWISH                       Patient Name: [name], [name] B([id number]) Sex: Female DOB: 28-Feb-1935    PCP: [place]: [facility]   Types of orders made on 10/26/2017: Nursing  Order Date:10/26/2017 Ordering Urban Gibson [[id number]] Encounter Provider:Wallace, Deno Etienne, RN [id number] Authorizing  Provider: Gildardo Cranker, [place] [[id number]] Department:[facility][[id number]]  Order Specific Information Order: Comm to Pharmacy [Custom: [id number]]  Order #: [id number] Qty: 1   Priority: Routine  Class: Clinic Performed   Comment:Please assign to pharmacist,                        Patient is on greater than 10 medication. Usually pills are filled             in blister packs, but there are a number  of pills and vitamins that             are not in the pack. Hydralazine was ordered three times/day, but             client was only taking twice a day. Thank you.                        Thea Silversmith, RN, MSN, [place] Coordinator            Cell: [phone]     Priority: Routine  Class: Clinic Performed   Comment:Please assign to pharmacist,                        Patient is on great er than 10 medication. Usually pills are filled             in blister packs, but there are a number of pills and vitamins that             are not in the pack. Hydralazine was ordered three times/day, but             client was only taking twice a day. Thank you.                        Thea Silversmith, RN, MSN, [place] Coordinator            Cell: [phone]

## 2017-10-30 NOTE — Patient Outreach (Signed)
[facility]) Care Management  10/30/2017  SHERVON KERWIN 1935/01/06 [id number]   CSW made an initial attempt to try and contact patient today to perform the initial phone assessment, as well as assess and assist with social work needs and services, without success.  A HIPAA compliant message was left for patient on voicemail.  CSW is currently awaiting a return call.  CSW will make a second outreach attempt within the next 3-4 business days, if a return call is not received from patient in the meantime.  CSW will also mail an Outreach Letter to patient's home requesting that patient contact CSW if patient is interested in receiving social work services through [place] with Scientist, clinical (histocompatibility and immunogenetics). Nat Christen, BSW, MSW, LCSW  Licensed Education officer, [facility]  Mailing [place] [address] [place], [place] [address] Physical Address-[address], [place], [place] [address] Toll Free Main # [phone] Fax # [phone] Cell # [phone]  Office # [phone] [email or web address]

## 2017-10-30 NOTE — Patient Outreach (Signed)
[name]) Care Management  10/30/2017  [name] 1935/04/29 [id number]  Patient was called regarding management. Spoke with her daughter [name]. HIPAA identifiers were obtained.  Unfortunately, the patient and her daughter could not speak with me because they were at the doctor's office and asked that I call them back.   Plan: Call patient back in 1-3 business days.  [name], PharmD, [place] Clinical Pharmacist [phone]

## 2017-10-31 DIAGNOSIS — F329 Major depressive disorder, single episode, unspecified: Secondary | ICD-10-CM | POA: Diagnosis not present

## 2017-10-31 DIAGNOSIS — N183 Chronic kidney disease, stage 3 (moderate): Secondary | ICD-10-CM | POA: Diagnosis not present

## 2017-10-31 DIAGNOSIS — D631 Anemia in chronic kidney disease: Secondary | ICD-10-CM | POA: Diagnosis not present

## 2017-10-31 DIAGNOSIS — E1122 Type 2 diabetes mellitus with diabetic chronic kidney disease: Secondary | ICD-10-CM | POA: Diagnosis not present

## 2017-10-31 DIAGNOSIS — I5033 Acute on chronic diastolic (congestive) heart failure: Secondary | ICD-10-CM | POA: Diagnosis not present

## 2017-10-31 DIAGNOSIS — I2510 Atherosclerotic heart disease of native coronary artery without angina pectoris: Secondary | ICD-10-CM | POA: Diagnosis not present

## 2017-10-31 DIAGNOSIS — E1165 Type 2 diabetes mellitus with hyperglycemia: Secondary | ICD-10-CM | POA: Diagnosis not present

## 2017-10-31 DIAGNOSIS — I130 Hypertensive heart and chronic kidney disease with heart failure and stage 1 through stage 4 chronic kidney disease, or unspecified chronic kidney disease: Secondary | ICD-10-CM | POA: Diagnosis not present

## 2017-10-31 DIAGNOSIS — I252 Old myocardial infarction: Secondary | ICD-10-CM | POA: Diagnosis not present

## 2017-10-31 DIAGNOSIS — G894 Chronic pain syndrome: Secondary | ICD-10-CM | POA: Diagnosis not present

## 2017-10-31 DIAGNOSIS — C50919 Malignant neoplasm of unspecified site of unspecified female breast: Secondary | ICD-10-CM | POA: Diagnosis not present

## 2017-10-31 DIAGNOSIS — M353 Polymyalgia rheumatica: Secondary | ICD-10-CM | POA: Diagnosis not present

## 2017-10-31 DIAGNOSIS — J449 Chronic obstructive pulmonary disease, unspecified: Secondary | ICD-10-CM | POA: Diagnosis not present

## 2017-11-01 ENCOUNTER — Other Ambulatory Visit: Payer: Self-pay | Admitting: Pharmacist

## 2017-11-01 ENCOUNTER — Ambulatory Visit
Admission: RE | Admit: 2017-11-01 | Discharge: 2017-11-01 | Disposition: A | Discharge status: Home / Self Care | Payer: PPO | Source: Ambulatory Visit | Attending: Hematology | Admitting: Hematology

## 2017-11-01 DIAGNOSIS — R921 Mammographic calcification found on diagnostic imaging of breast: Secondary | ICD-10-CM | POA: Diagnosis not present

## 2017-11-01 DIAGNOSIS — C50812 Malignant neoplasm of overlapping sites of left female breast: Secondary | ICD-10-CM

## 2017-11-01 DIAGNOSIS — Z170 Estrogen receptor positive status [ER+]: Principal | ICD-10-CM

## 2017-11-01 NOTE — Patient Outreach (Signed)
[facility]) Care Management  11/01/2017  [name] [birth year] [id number]   Patient was called regarding medication management. Patient's daughter, [name] answered the phone.  HIPAA identifiers were obtained.    [name] said they were able to review all of the patient's medications at her cardiology appointment on 10/30/17 so she did not think she needed [place].  However, she did think of a question just before getting off the phone but could not remember the medication she wanted to ask about and was still in the bed.  Patient said she would call me back later in the day with her question.  Plan: Await a call back from the patient's daughter. Follow up with her in 1-2 business days if I do not hear back from her.   [name], PharmD, [place] Clinical Pharmacist [phone]

## 2017-11-02 ENCOUNTER — Other Ambulatory Visit: Payer: Self-pay | Admitting: Pharmacist

## 2017-11-02 ENCOUNTER — Ambulatory Visit: Payer: Self-pay | Admitting: Pharmacist

## 2017-11-02 ENCOUNTER — Other Ambulatory Visit: Payer: Self-pay

## 2017-11-02 NOTE — Patient Outreach (Signed)
[facility] [facility]) Care Management  11/02/2017  SHAQUAN PUERTA 1935/03/19 [id number]  RNCM following post hospitalization for heart failure exacerbation.Client answered the phone. she reports the phone is not near her all the time. She states her daughter [name], caregiver is not home at this time. Client with no specific complaints at this time.  History of heart failure. Client reports she is not weighing daily. Client reports following up with cardiologist this week and Acknowledged medication changes with Hydralazine.  [facility] CM Care Plan Problem One     Most Recent Value  Care Plan Problem One  at risk for readmission as evidence of recent hospitalization.  Role Documenting the Problem One  Care Management [place] for Problem One  Active  [facility] Long Term Goal   client will not have readmission within the next 31-45 days.  [facility] Long Term Goal Start Date  10/15/17  Interventions for Problem One Long Term Goal  discussed office visit with cardiologist with client. reviewed medication changes that cardiologist made.  [facility] CM Short Term Goal #1   client/family will verbalize start of care with home health agency within the next 1-2 weeks.  [facility] CM Short Term Goal #1 Start Date  10/15/17  [facility] CM Short Term Goal #1 Met Date  10/25/17  [facility] CM Short Term Goal #2   client will begin to weigh self daily within the next 1-2 weeks.  [facility] CM Short Term Goal #2 Start Date  10/15/17  Interventions for Short Term Goal #2  discussed with client the importance of weighing self and recording weights daily    [facility] CM Care Plan Problem Two     Most Recent Value  Care Plan Problem Two  medication adherence  Role Documenting the Problem Two  Care Management [place] for Problem Two  Active  [facility] CM Short Term Goal #1   client will take hydralazine as ordered.  [facility] CM Short Term Goal #1 Start Date  10/25/17  Interventions for Short Term Goal #2   reviewed medication changes per  cardiologist with client     Plan: home visit next month.  Thea Silversmith, RN, MSN, Industry Coordinator Cell: [phone]

## 2017-11-02 NOTE — Patient Outreach (Signed)
[name] Care Management  11/02/2017  [name] 29-Oct-1934 [id number]   Called patient regarding medication management and a question from her daughter.  Unfortunately, the patient did not answer her phone.  Today's call was the third call to the patient.  Yesterday, the patient's daughter declined pharmacy services but said she would call me back with a quick question.  She did not call back and I attempted to call back without success.  Plan: Since the patient declined services already, did not call back as she said, and did not answer today after three other attempts to speak with her, the pharmacy case will be closed.  I will alert the other [facility] disciplines accordingly. A HIPAA compliant message was left on the patient's voicemail. I am more than willing to speak with the patient and her daughter at a later date if they so desire.   [name], PharmD, [place] Clinical Pharmacist [phone]

## 2017-11-05 ENCOUNTER — Encounter: Payer: Self-pay | Admitting: Internal Medicine

## 2017-11-05 ENCOUNTER — Ambulatory Visit (INDEPENDENT_AMBULATORY_CARE_PROVIDER_SITE_OTHER): Payer: PPO | Admitting: Internal Medicine

## 2017-11-05 ENCOUNTER — Other Ambulatory Visit: Payer: Self-pay | Admitting: *Deleted

## 2017-11-05 ENCOUNTER — Encounter: Payer: Self-pay | Admitting: *Deleted

## 2017-11-05 VITALS — BP 100/60 | HR 68 | Ht 67.0 in | Wt 323.0 lb

## 2017-11-05 DIAGNOSIS — G8929 Other chronic pain: Secondary | ICD-10-CM | POA: Diagnosis not present

## 2017-11-05 DIAGNOSIS — K9589 Other complications of other bariatric procedure: Secondary | ICD-10-CM

## 2017-11-05 DIAGNOSIS — I5040 Unspecified combined systolic (congestive) and diastolic (congestive) heart failure: Secondary | ICD-10-CM | POA: Diagnosis not present

## 2017-11-05 DIAGNOSIS — K224 Dyskinesia of esophagus: Secondary | ICD-10-CM | POA: Diagnosis not present

## 2017-11-05 DIAGNOSIS — D509 Iron deficiency anemia, unspecified: Secondary | ICD-10-CM

## 2017-11-05 DIAGNOSIS — F5089 Other specified eating disorder: Secondary | ICD-10-CM | POA: Diagnosis not present

## 2017-11-05 DIAGNOSIS — E875 Hyperkalemia: Secondary | ICD-10-CM | POA: Diagnosis not present

## 2017-11-05 DIAGNOSIS — E669 Obesity, unspecified: Secondary | ICD-10-CM | POA: Diagnosis not present

## 2017-11-05 DIAGNOSIS — D649 Anemia, unspecified: Secondary | ICD-10-CM | POA: Diagnosis not present

## 2017-11-05 DIAGNOSIS — R0689 Other abnormalities of breathing: Secondary | ICD-10-CM | POA: Diagnosis not present

## 2017-11-05 DIAGNOSIS — D508 Other iron deficiency anemias: Secondary | ICD-10-CM

## 2017-11-05 DIAGNOSIS — M1990 Unspecified osteoarthritis, unspecified site: Secondary | ICD-10-CM | POA: Diagnosis not present

## 2017-11-05 NOTE — Patient Outreach (Signed)
[place] [facility]) Care Management  11/05/2017  FATHIMA BARTL 22-Feb-1935 [id number]   Erroneous Encounter.  Nat Christen, BSW, MSW, LCSW  Licensed Education officer, environmental Health System  Mailing [place] [address] [place], [place] [address] Physical Address-[address], [place], [place] [address] Toll Free Main # [phone] Fax # [phone] Cell # [phone]  Office # [phone] [email or web address]

## 2017-11-05 NOTE — Patient Outreach (Signed)
[place] [facility]) Care Management  11/05/2017  [name] June 22, 1934 [id number]   CSW made a second attempt to try and contact patient today to perform phone assessment, as well as assess and assist with social work needs and services, without success.  A HIPAA compliant message was left for patient on voicemail.  CSW continues to await a return call.  CSW will make a third and final outreach attempt within the next 3-4 business days, if a return call is not received from patient in the meantime.  CSW will then proceed with case closure if a return call is not received from patient with a total of 10 business days, as required number of phone attempts will have been made and outreach letter mailed.  Nat Christen, BSW, MSW, LCSW  Licensed Education officer, environmental Health System  Mailing [place] [address], [place], [place] [address] Physical Address-[address], [place], [place] [address] Toll Free Main # [phone] Fax # [phone] Cell # [phone]  Office # [phone] [email or web address]

## 2017-11-05 NOTE — Progress Notes (Signed)
[name] 83 y.o. August 08, 1934 [id number]  Assessment & Plan:   Encounter Diagnoses  Name Primary?  . Esophageal dysmotility Yes  . Iron deficiency anemia following bariatric surgery   . Pica     Dysphagia is rare and no goo tx to help. Mostly with cold water and she also has ice pica. Advised avoid ice and cold water.  Suspect iron deficiency is from malabsorption after bariatric surgery. Suspect mixed deficiency and chronic disease anemias. This has been a chronic recurrent issue and I do not recommend endoscopic evaluation. She may need an infusion of iron. Will discuss w/ PCP.Pica should resolve with ferritin > 100  She is c/o pruitits and likely neuropathic pain - I explained she needs to go back to PCP  I appreciate the opportunity to care for her. ER:[id number], Monica, DO   Subjective:   Chief Complaint: dysphagia  HPI  Here w/ daughters - intermittent swallowing difficulty with strangling or choking on cold water. EGD last year -  - Tortuous esophagus. Raises ? of dysmotility - dilated 18 mm balloon retrograde pull through - Gastric stenosis was found in the gastric body. - The examination was otherwise normal. - No specimens collected.  Iron is low (ferritin low NL) No bleeding seen. Has ice Pica Allergies  Allergen Reactions  . Sulfonamide Derivatives Swelling    Mouth swelling  . Tramadol Nausea And Vomiting  . Aricept [Donepezil Hcl]     GI upset/loose stools   Current Meds  Medication Sig  . albuterol (PROVENTIL HFA;VENTOLIN HFA) 108 (90 Base) MCG/ACT inhaler Inhale 1-2 puffs into the lungs every 6 (six) hours as needed for wheezing or shortness of breath.  . anastrozole (ARIMIDEX) 1 MG tablet TAKE 1 TABLET BY MOUTH daily  . Biotin 10000 MCG TABS Take 1 tablet by mouth daily.  . budesonide-formoterol (SYMBICORT) 160-4.5 MCG/ACT inhaler Inhale 2 puffs into the lungs 2 (two) times daily. For bronchitis  . Cholecalciferol (VITAMIN D3) 5000 units  CAPS Take 1 capsule by mouth daily.  . clonazePAM (KLONOPIN) 1 MG tablet TAKE 2 TABLETS BY MOUTH AT BEDTIME AS NEEDED FOR ANXIETY  . CRANBERRY EXTRACT PO Take 1 tablet by mouth daily.  . DULoxetine (CYMBALTA) 60 MG capsule Take one capsule by mouth once daily for anxiety  . estradiol (ESTRACE) 0.1 MG/GM vaginal cream Use every other evening use in vaginal area    Uses as needed  . ferrous gluconate (FERGON) 324 MG tablet Take 1 tablet (324 mg total) by mouth daily with breakfast.  . furosemide (LASIX) 20 MG tablet Take 20 mg by mouth daily.  [name] gabapentin (NEURONTIN) 100 MG capsule Take 100 mg by mouth at bedtime.  [name] glucose blood (ONETOUCH VERIO) test strip USE 1 STRIP TO CHECK GLUCOSE THREE TIMES DAILY  E11.21  . hydrALAZINE (APRESOLINE) 50 MG tablet Take 1 tablet (50 mg total) by mouth 2 (two) times daily.  . hydrOXYzine (ATARAX/VISTARIL) 10 MG tablet Take 1 tablet (10 mg total) by mouth 3 (three) times daily as needed for itching.  . insulin aspart (NOVOLOG FLEXPEN) 100 UNIT/ML FlexPen Before each meal 3 times a day, 140-199 - 2 units, 200-250 - 4 units, 251-299 - 6 units,  300-349 - 8 units,  350 or above 10 units. Insulin PEN if approved, provide syringes and needles if needed.  . Insulin Glargine (TOUJEO SOLOSTAR) 300 UNIT/ML SOPN Inject 38 Units into the skin 2 (two) times daily. (before breakfast and before supper)  . isosorbide mononitrate (IMDUR)  60 MG 24 hr tablet TAKE 1 TABLET BY MOUTH EVERY DAY  . Lancet Device MISC Onetouch verio machine lancets, check blood sugar twice daily as directed DX E11.69  . magnesium oxide (MAG-OX) 400 MG tablet Take 1 tablet (400 mg total) by mouth daily.  . meclizine (ANTIVERT) 25 MG tablet Take one tablet by mouth three times daily as needed for dizziness  . memantine (NAMENDA) 5 MG tablet Take 5-10 mg by mouth See admin instructions. Take 2 tablets (10mg ) every AM and 1 tablet (5mg ) every evening.  . metoprolol succinate (TOPROL-XL) 25 MG 24 hr tablet  TAKE 1 TABLET BY MOUTH EVERY DAY FOR blood pressure  . montelukast (SINGULAIR) 10 MG tablet Take 10 mg by mouth at bedtime.  . nitroGLYCERIN (NITROSTAT) 0.4 MG SL tablet Take one tablet under the tongue every 5 minutes as needed for chest pain  . oxyCODONE-acetaminophen (PERCOCET) 10-325 MG tablet Take 1 tablet by mouth every 6 (six) hours.  . pantoprazole (PROTONIX) 40 MG tablet Take 1 tablet (40 mg total) by mouth daily.  . pravastatin (PRAVACHOL) 20 MG tablet TAKE 1 TABLET BY MOUTH EVERY DAY FOR cholesterol  . rivaroxaban (XARELTO) 10 MG TABS tablet Take 1 tablet (10 mg total) by mouth daily.  . Turmeric 500 MG CAPS Take 1 tablet by mouth daily.  [name] ULORIC 40 MG tablet Take 1 tablet (40 mg total) by mouth daily.  . vitamin B-12 (CYANOCOBALAMIN) 1000 MCG tablet Take 1,000 mcg by mouth daily.   Past Medical History:  Diagnosis Date  . Allergy    takes Mucinex daily as needed  . Anemia, unspecified   . Anxiety    takes Clonazepam daily as needed  . Arthritis   . CHF (congestive heart failure) (HCC)    takes Furosemide daily  . Coronary artery disease    a. s/p IMI 2004 tx with BMS to RCA;  b. s/p Promus DES to RCA 2/12 c. 06/2014 High risk stress test follow up cath 06/2014 showd patent stent--< med therapy for other mild to moedrate disease   . Coronary atherosclerosis of native coronary artery   . Depression    takes Cymbalta daily  . Diabetes mellitus    insulin daily  . Dysuria   . GERD (gastroesophageal reflux disease)    takes Protonix daily  . Gout, unspecified    takes Colchicine daily  . Headache    occasionally  . History of blood clots about 4yrs ago   in legs-takes Coumadin   . Hyperlipidemia    takes Pravastatin daily  . Hypertension    takes Imdur and Metoprolol daily  . Insomnia   . Joint pain   . Joint swelling   . Myocardial infarction ([place]) 2004  . Numbness   . Obstructive sleep apnea   . Osteoarthritis   . Osteoarthritis   . Osteoarthrosis, unspecified  whether generalized or localized, lower leg   . Pain, chronic   . Polymyalgia rheumatica ([place])   . Pulmonary emboli ([place]) 9/13   felt to need lifelong anticoagulation  . Shortness of breath dyspnea    with exertion and has Albuterol inhaler prn  . Type II or unspecified type diabetes mellitus without mention of complication, uncontrolled   . Urinary incontinence    takes Linzess daily  . Vertigo    hx of;was taking Meclizine if needed   Past Surgical History:  Procedure Laterality Date  . ABDOMINAL HYSTERECTOMY     partial  . APPENDECTOMY    .  blood clots/legs and lungs  2013  . BREAST BIOPSY Left 07/22/2014  . BREAST BIOPSY Left 02/10/2013  . BREAST LUMPECTOMY Left 11/05/2014  . BREAST LUMPECTOMY WITH RADIOACTIVE SEED LOCALIZATION Left 11/05/2014   Procedure: LEFT BREAST LUMPECTOMY WITH RADIOACTIVE SEED LOCALIZATION;  Surgeon: [name], MD;  Location: [facility];  Service: General;  Laterality: Left;  . CARDIAC CATHETERIZATION    . COLONOSCOPY    . CORONARY ANGIOPLASTY  2  . ESOPHAGOGASTRODUODENOSCOPY (EGD) WITH PROPOFOL N/A 11/07/2016   Procedure: ESOPHAGOGASTRODUODENOSCOPY (EGD) WITH PROPOFOL;  Surgeon: Gatha Mayer, MD;  Location: [facility];  Service: Endoscopy;  Laterality: N/A;  . EXCISION OF SKIN TAG Right 11/05/2014   Procedure: EXCISION OF RIGHT EYELID SKIN TAG;  Surgeon: [name], MD;  Location: [facility];  Service: General;  Laterality: Right;  . EYE SURGERY Bilateral    cataract   . GASTRIC BYPASS  1977    reversed in 1979, [place] N/A 06/29/2014   Procedure: LEFT HEART CATHETERIZATION WITH CORONARY ANGIOGRAM;  Surgeon: Troy Sine, MD;  Location: [facility] C[facility] LAB;  Service: Cardiovascular;  Laterality: N/A;  . MI with stent placement  2004   Social History   Social History Narrative  . Not on file   family history includes Arthritis in her father and sister; Breast cancer (age of onset: 36) in  her mother; Diabetes in her father and sister; Heart disease in her cousin and mother; Hypertension in her father; Obesity in her sister; Throat cancer in her father.   Review of Systems As above - is in a wheelchair  Objective:   Physical Exam BP 100/60   Pulse 68   Ht 5\' 7"  (1.702 m)   Wt (!) 323 lb (146.5 kg)   BMI 50.59 kg/m  Obese, NAD In wheelchair Eyes anicteric Neck supple w/o mass, TN, lymphadenopathy Alert and oriented and NL mood and affect  Lab Results  Component Value Date   FERRITIN 25 10/09/2017   Lab Results  Component Value Date   WBC 11.6 (H) 10/23/2017   HGB 8.5 (L) 10/23/2017   HCT 28.4 (L) 10/23/2017   MCV 82.6 10/23/2017   PLT 317 10/23/2017

## 2017-11-05 NOTE — Patient Instructions (Signed)
  Dr Carlean Purl is going to talk with Dr Eulas Post about a possible iron infusion.   Per Dr Carlean Purl don't drink ICE water, no cold water.     I appreciate the opportunity to care for you. Silvano Rusk, MD, [facility]

## 2017-11-09 ENCOUNTER — Other Ambulatory Visit: Payer: Self-pay | Admitting: *Deleted

## 2017-11-09 NOTE — Patient Outreach (Signed)
[facility]) Care Management  11/09/2017  ORIE CUTTINO 1935-03-16 [id number]    CSW made a third and final attempt to try and contact patient today to perform phone assessment, as well as assess and assist with social work needs and services, without success.  A HIPAA compliant message was left for patient on voicemail.  CSW is currently awaiting a return call.  CSW will proceed with case closure in two business days, if a return call is not received in the meantime, as required number of phone attempts have been made and an outreach letter was mailed to patient's home allowing 10 business days for a response. Nat Christen, BSW, MSW, LCSW  Licensed Education officer, environmental Health System  Mailing [place] [address] [place], [place] [address] Physical Address-[address], [place], [place] [address] Toll Free Main # [phone] Fax # [phone] Cell # [phone]  Office # [phone] [email or web address]

## 2017-11-12 ENCOUNTER — Other Ambulatory Visit: Payer: Self-pay | Admitting: Internal Medicine

## 2017-11-12 DIAGNOSIS — R413 Other amnesia: Secondary | ICD-10-CM

## 2017-11-13 ENCOUNTER — Telehealth: Payer: Self-pay

## 2017-11-13 ENCOUNTER — Other Ambulatory Visit: Payer: Self-pay

## 2017-11-13 ENCOUNTER — Other Ambulatory Visit: Payer: Self-pay | Admitting: *Deleted

## 2017-11-13 DIAGNOSIS — D509 Iron deficiency anemia, unspecified: Secondary | ICD-10-CM

## 2017-11-13 DIAGNOSIS — D508 Other iron deficiency anemias: Secondary | ICD-10-CM

## 2017-11-13 DIAGNOSIS — K9589 Other complications of other bariatric procedure: Secondary | ICD-10-CM

## 2017-11-13 NOTE — Telephone Encounter (Signed)
Patient's daughter [name] and she verbalized understanding of recommendations and appt for Feraheme #1 on 11/21/17 at [facility].  She understands that they will help her set up the 2nd infusion at the 7/17 appt.  New labs entered to 7 weeks

## 2017-11-13 NOTE — Telephone Encounter (Signed)
-----   Message from Gatha Mayer, MD sent at 11/12/2017  8:14 PM EDT ----- Regarding: FW: feraheme Please arrange feraheme x 2 for iron def anemia and do a cbc and ferritin 1 month after second feraheme ----- Message ----- From: Gildardo Cranker, DO Sent: 11/08/2017  10:28 AM To: Gatha Mayer, MD Subject: RE: Diamantina [place],  It would be best if the order came from your office. Thank you for keeping me in the loop!  Monica  ----- Message ----- From: Gatha Mayer, MD Sent: 11/08/2017   8:07 AM To: Gildardo Cranker, DO Subject: feraheme                                       Hi Monica,  Saw this lady Monday - she is almost certainly a poor iron absorber post bariatric surgery (which has been overcome).  I think a feraheme infusion x 2 would help her.  Are you ok ordering this or would you like me to set this up?  Thanks,  Silvano Rusk

## 2017-11-13 NOTE — Patient Outreach (Signed)
[facility]) Care Management  11/13/2017  [name] 1934-05-28 [id number]   CSW will perform a case closure on patient, due to inability to establish initial phone contact, despite required number of phone attempts made and outreach letter mailed to patient's home allowing 10 business days for a response.  CSW will notify patient's RNCM with [place] Management, Thea Silversmith of CSW's plans to close patient's case.  CSW will fax an update to patient's Primary Care Physician, Dr. Gildardo Cranker to ensure that they are aware of CSW's involvement with patient's plan of care.   Nat Christen, BSW, MSW, LCSW  Licensed Education officer, environmental Health System  Mailing [place] [address], [place], [name] [address] Physical Address-[address], [place],  [address] Toll Free Main # [phone] Fax # [phone] Cell # [phone]  Office # [phone] [email or web address]

## 2017-11-15 ENCOUNTER — Ambulatory Visit: Payer: PPO | Admitting: Physician Assistant

## 2017-11-21 ENCOUNTER — Other Ambulatory Visit: Payer: Self-pay

## 2017-11-21 ENCOUNTER — Ambulatory Visit (HOSPITAL_COMMUNITY)
Admission: RE | Admit: 2017-11-21 | Discharge: 2017-11-21 | Disposition: A | Discharge status: Home / Self Care | Payer: PPO | Source: Ambulatory Visit | Attending: Internal Medicine | Admitting: Internal Medicine

## 2017-11-21 DIAGNOSIS — G894 Chronic pain syndrome: Secondary | ICD-10-CM

## 2017-11-21 DIAGNOSIS — D509 Iron deficiency anemia, unspecified: Secondary | ICD-10-CM | POA: Insufficient documentation

## 2017-11-21 DIAGNOSIS — K9589 Other complications of other bariatric procedure: Secondary | ICD-10-CM

## 2017-11-21 DIAGNOSIS — D508 Other iron deficiency anemias: Secondary | ICD-10-CM

## 2017-11-21 MED ORDER — SODIUM CHLORIDE 0.9 % IV SOLN
510.0000 mg | INTRAVENOUS | Status: DC
  Administered 2017-11-21: 510 mg via INTRAVENOUS
  Filled 2017-11-21: qty 17

## 2017-11-21 MED ORDER — OXYCODONE-ACETAMINOPHEN 10-325 MG PO TABS: 1.0000 | ORAL_TABLET | Freq: Four times a day (QID) | ORAL | 0 refills | Status: DC

## 2017-11-21 NOTE — Telephone Encounter (Signed)
Left message on voicemail informing patient rx sent to pharmacy

## 2017-11-21 NOTE — Telephone Encounter (Signed)
[place] Database verified and compliance confirmed   

## 2017-11-21 NOTE — Discharge Instructions (Signed)

## 2017-11-27 ENCOUNTER — Inpatient Hospital Stay: Payer: PPO | Attending: Hematology | Admitting: Hematology

## 2017-11-27 ENCOUNTER — Inpatient Hospital Stay: Payer: PPO

## 2017-11-27 ENCOUNTER — Telehealth: Payer: Self-pay | Admitting: Hematology

## 2017-11-27 VITALS — BP 147/73 | HR 78 | Temp 97.8°F | Resp 18 | Ht 67.0 in | Wt 325.8 lb

## 2017-11-27 DIAGNOSIS — M1990 Unspecified osteoarthritis, unspecified site: Secondary | ICD-10-CM | POA: Diagnosis not present

## 2017-11-27 DIAGNOSIS — I130 Hypertensive heart and chronic kidney disease with heart failure and stage 1 through stage 4 chronic kidney disease, or unspecified chronic kidney disease: Secondary | ICD-10-CM | POA: Insufficient documentation

## 2017-11-27 DIAGNOSIS — Z170 Estrogen receptor positive status [ER+]: Principal | ICD-10-CM

## 2017-11-27 DIAGNOSIS — C50812 Malignant neoplasm of overlapping sites of left female breast: Secondary | ICD-10-CM | POA: Diagnosis not present

## 2017-11-27 DIAGNOSIS — I509 Heart failure, unspecified: Secondary | ICD-10-CM | POA: Insufficient documentation

## 2017-11-27 DIAGNOSIS — N189 Chronic kidney disease, unspecified: Secondary | ICD-10-CM | POA: Insufficient documentation

## 2017-11-27 DIAGNOSIS — G4730 Sleep apnea, unspecified: Secondary | ICD-10-CM | POA: Diagnosis not present

## 2017-11-27 DIAGNOSIS — D649 Anemia, unspecified: Secondary | ICD-10-CM | POA: Diagnosis not present

## 2017-11-27 DIAGNOSIS — I2510 Atherosclerotic heart disease of native coronary artery without angina pectoris: Secondary | ICD-10-CM | POA: Diagnosis not present

## 2017-11-27 DIAGNOSIS — E1165 Type 2 diabetes mellitus with hyperglycemia: Secondary | ICD-10-CM | POA: Insufficient documentation

## 2017-11-27 DIAGNOSIS — Z79811 Long term (current) use of aromatase inhibitors: Secondary | ICD-10-CM | POA: Diagnosis not present

## 2017-11-27 DIAGNOSIS — E785 Hyperlipidemia, unspecified: Secondary | ICD-10-CM | POA: Insufficient documentation

## 2017-11-27 DIAGNOSIS — R52 Pain, unspecified: Secondary | ICD-10-CM | POA: Insufficient documentation

## 2017-11-27 DIAGNOSIS — E559 Vitamin D deficiency, unspecified: Secondary | ICD-10-CM | POA: Insufficient documentation

## 2017-11-27 DIAGNOSIS — C50112 Malignant neoplasm of central portion of left female breast: Secondary | ICD-10-CM

## 2017-11-27 LAB — CMP (CANCER CENTER ONLY)
ALT: 9 U/L (ref 0–44)
AST: 10 U/L — ABNORMAL LOW (ref 15–41)
Albumin: 3 g/dL — ABNORMAL LOW (ref 3.5–5.0)
Alkaline Phosphatase: 89 U/L (ref 38–126)
Anion gap: 8 (ref 5–15)
BUN: 32 mg/dL — ABNORMAL HIGH (ref 8–23)
CO2: 25 mmol/L (ref 22–32)
Calcium: 9.7 mg/dL (ref 8.9–10.3)
Chloride: 103 mmol/L (ref 98–111)
Creatinine: 1.65 mg/dL — ABNORMAL HIGH (ref 0.44–1.00)
GFR, Est AFR Am: 33 mL/min — ABNORMAL LOW (ref >60)
GFR, Estimated: 28 mL/min — ABNORMAL LOW (ref >60)
Glucose, Bld: 284 mg/dL — ABNORMAL HIGH (ref 70–99)
Potassium: 5.5 mmol/L — ABNORMAL HIGH (ref 3.5–5.1)
Sodium: 136 mmol/L (ref 135–145)
Total Bilirubin: 0.3 mg/dL (ref 0.3–1.2)
Total Protein: 8.4 g/dL — ABNORMAL HIGH (ref 6.5–8.1)

## 2017-11-27 LAB — CBC WITH DIFFERENTIAL/PLATELET
Basophils Absolute: 0 10*3/uL (ref 0.0–0.1)
Basophils Relative: 0 %
Eosinophils Absolute: 0.3 10*3/uL (ref 0.0–0.5)
Eosinophils Relative: 2 %
HCT: 30.3 % — ABNORMAL LOW (ref 34.8–46.6)
Hemoglobin: 9.1 g/dL — ABNORMAL LOW (ref 11.6–15.9)
Lymphocytes Relative: 20 %
Lymphs Abs: 2.3 10*3/uL (ref 0.9–3.3)
MCH: 24.9 pg — ABNORMAL LOW (ref 25.1–34.0)
MCHC: 30 g/dL — ABNORMAL LOW (ref 31.5–36.0)
MCV: 83 fL (ref 79.5–101.0)
Monocytes Absolute: 0.5 10*3/uL (ref 0.1–0.9)
Monocytes Relative: 4 %
Neutro Abs: 8.7 10*3/uL — ABNORMAL HIGH (ref 1.5–6.5)
Neutrophils Relative %: 74 %
Platelets: 303 10*3/uL (ref 145–400)
RBC: 3.65 MIL/uL — ABNORMAL LOW (ref 3.70–5.45)
RDW: 16.1 % — ABNORMAL HIGH (ref 11.2–14.5)
WBC: 11.8 10*3/uL — ABNORMAL HIGH (ref 3.9–10.3)

## 2017-11-27 LAB — IRON AND TIBC
Iron: 55 ug/dL (ref 41–142)
Saturation Ratios: 22 % (ref 21–57)
TIBC: 255 ug/dL (ref 236–444)
UIBC: 200 ug/dL

## 2017-11-27 LAB — FERRITIN: Ferritin: 573 ng/mL — ABNORMAL HIGH (ref 11–307)

## 2017-11-27 NOTE — Progress Notes (Signed)
.    Hematology oncology Clinic Followup Note  Date of service 11/27/17   Patient Care Team: Gildardo Cranker, DO as PCP - General (Internal Medicine) [name], [name], MD (Inactive) as Consulting Physician (Cardiology) [name], MD as Consulting Physician (Ophthalmology) [name], MD as Consulting Physician (General Surgery) [name], MD as Referring Physician (Obstetrics and Gynecology) Leona Singleton, RN as [place] Management Luretha Rued, RN as [place] Management  CHIEF COMPLAINTS/PURPOSE OF CONSULTATION:  F/u for breast cancer.  DIAGNOSIS: left-sided presumed stage IA  (pT1c,pNx[cN0], cM0) invasive ductal carcinoma grade 2 out of 3, strongly ER +100%, strongly PR +100% and HER-2/neu negative. Given her high risk cardiac status lumpectomy had to be done under local anesthesia and sentinel lymph node biopsy was not possible. She has accompanying DCIS of intermediate grade present at margins. ECOG performance status is 3. -Had a mammogram/tomosynthesis done on 07/29/2015 which shows residual suspicious calcification in the left breast postero-medial to the biopsy site. Has known residual DCIS.  No evidence of new malignancy.  No evidence of right breast malignancy.   CURRENT TREATMENT: Arimidex 46m po daily  HISTORY OF PRESENTING ILLNESS: -please see my initial consultation for details of initial presentation  INTERVAL HISTORY   [name] here for her month followup of her breast cancer and discuss recent scan. [name] last visit with [facility] on 11/01/17. She presents to the clinic today accompanied by a family member.  She notes she was not scheduled for her CT scans. She notes she has not had body aches recently. Her latest mammogram on 11/01/2017  is stable with no evidence of breast cancer progression at this time . She notes she is taking anastrozole still.   On review of symptoms, pt notes her body aches  are better at times and intermittent.    MEDICAL HISTORY:  Past Medical History:  Diagnosis Date  . Allergy    takes Mucinex daily as needed  . Anemia, unspecified   . Anxiety    takes Clonazepam daily as needed  . Arthritis   . CHF (congestive heart failure) (HCC)    takes Furosemide daily  . Coronary artery disease    a. s/p IMI 2004 tx with BMS to RCA;  b. s/p Promus DES to RCA 2/12 c. 06/2014 High risk stress test follow up cath 06/2014 showd patent stent--< med therapy for other mild to moedrate disease   . Coronary atherosclerosis of native coronary artery   . Depression    takes Cymbalta daily  . Diabetes mellitus    insulin daily  . Dysuria   . GERD (gastroesophageal reflux disease)    takes Protonix daily  . Gout, unspecified    takes Colchicine daily  . Headache    occasionally  . History of blood clots about 540yrago   in legs-takes Coumadin   . Hyperlipidemia    takes Pravastatin daily  . Hypertension    takes Imdur and Metoprolol daily  . Insomnia   . Joint pain   . Joint swelling   . Myocardial infarction ([facility]  . Numbness   . Obstructive sleep apnea   . Osteoarthritis   . Osteoarthritis   . Osteoarthrosis, unspecified whether generalized or localized, lower leg   . Pain, chronic   . Polymyalgia rheumatica ([facility]  . Pulmonary emboli ([facility]13   felt to need lifelong anticoagulation  . Shortness of breath dyspnea    with  exertion and has Albuterol inhaler prn  . Type II or unspecified type diabetes mellitus without mention of complication, uncontrolled   . Urinary incontinence    takes Linzess daily  . Vertigo    hx of;was taking Meclizine if needed    SURGICAL HISTORY: Past Surgical History:  Procedure Laterality Date  . ABDOMINAL HYSTERECTOMY     partial  . APPENDECTOMY    . blood clots/legs and lungs  2013  . BREAST BIOPSY Left 07/22/2014  . BREAST BIOPSY Left 02/10/2013  . BREAST LUMPECTOMY Left 11/05/2014  . BREAST LUMPECTOMY  WITH RADIOACTIVE SEED LOCALIZATION Left 11/05/2014   Procedure: LEFT BREAST LUMPECTOMY WITH RADIOACTIVE SEED LOCALIZATION;  Surgeon: [name], MD;  Location: [facility];  Service: General;  Laterality: Left;  . CARDIAC CATHETERIZATION    . COLONOSCOPY    . CORONARY ANGIOPLASTY  2  . ESOPHAGOGASTRODUODENOSCOPY (EGD) WITH PROPOFOL N/A 11/07/2016   Procedure: ESOPHAGOGASTRODUODENOSCOPY (EGD) WITH PROPOFOL;  Surgeon: Gatha Mayer, MD;  Location: [facility];  Service: Endoscopy;  Laterality: N/A;  . EXCISION OF SKIN TAG Right 11/05/2014   Procedure: EXCISION OF RIGHT EYELID SKIN TAG;  Surgeon: [name], MD;  Location: [place];  Service: General;  Laterality: Right;  . EYE SURGERY Bilateral    cataract   . GASTRIC BYPASS  1977    reversed in 1979, [place] N/A 06/29/2014   Procedure: LEFT HEART CATHETERIZATION WITH CORONARY ANGIOGRAM;  Surgeon: Troy Sine, MD;  Location: [facility] C[facility] LAB;  Service: Cardiovascular;  Laterality: N/A;  . MI with stent placement  2004    SOCIAL HISTORY: Social History   Socioeconomic History  . Marital status: Widowed    Spouse name: Not on file  . Number of children: Not on file  . Years of education: Not on file  . Highest education level: Not on file  Occupational History  . Occupation: retired  Scientific laboratory technician  . Financial resource strain: Not hard at all  . Food insecurity:    Worry: Never true    Inability: Never true  . Transportation needs:    Medical: Yes    Non-medical: Yes  Tobacco Use  . Smoking status: Never Smoker  . Smokeless tobacco: Never Used  Substance and Sexual Activity  . Alcohol use: No    Alcohol/week: 0.0 oz  . Drug use: No  . Sexual activity: Not Currently  Lifestyle  . Physical activity:    Days per week: 0 days    Minutes per session: 0 min  . Stress: Rather much  Relationships  . Social connections:    Talks on phone: Once a week    Gets together:  Three times a week    Attends religious service: 1 to 4 times per year    Active member of club or organization: Yes    Attends meetings of clubs or organizations: 1 to 4 times per year    Relationship status: Widowed  . Intimate partner violence:    Fear of current or ex partner: No    Emotionally abused: No    Physically abused: No    Forced sexual activity: No  Other Topics Concern  . Not on file  Social History Narrative  . Not on file    FAMILY HISTORY: Family History  Problem Relation Age of Onset  . Breast cancer Mother 76  . Heart disease Mother   . Throat cancer Father   . Hypertension Father   .  Arthritis Father   . Diabetes Father   . Arthritis Sister   . Obesity Sister   . Diabetes Sister   . Heart disease Cousin   . Colon cancer Neg Hx   . Stomach cancer Neg Hx   . Esophageal cancer Neg Hx     ALLERGIES:  is allergic to sulfonamide derivatives; tramadol; and aricept [donepezil hcl].  MEDICATIONS:  Current Outpatient Medications  Medication Sig Dispense Refill  . albuterol (PROVENTIL HFA;VENTOLIN HFA) 108 (90 Base) MCG/ACT inhaler Inhale 1-2 puffs into the lungs every 6 (six) hours as needed for wheezing or shortness of breath. 1 Inhaler 6  . anastrozole (ARIMIDEX) 1 MG tablet TAKE 1 TABLET BY MOUTH daily 90 tablet 1  . Biotin 10000 MCG TABS Take 1 tablet by mouth daily.    . budesonide-formoterol (SYMBICORT) 160-4.5 MCG/ACT inhaler Inhale 2 puffs into the lungs 2 (two) times daily. For bronchitis 1 Inhaler 6  . Cholecalciferol (VITAMIN D3) 5000 units CAPS Take 1 capsule by mouth daily.    . clonazePAM (KLONOPIN) 1 MG tablet TAKE 2 TABLETS BY MOUTH AT BEDTIME AS NEEDED FOR ANXIETY 60 tablet 0  . CRANBERRY EXTRACT PO Take 1 tablet by mouth daily.    . DULoxetine (CYMBALTA) 60 MG capsule Take one capsule by mouth once daily for anxiety 90 capsule 1  . estradiol (ESTRACE) 0.1 MG/GM vaginal cream Use every other evening use in vaginal area    Uses as needed      . ferrous gluconate (FERGON) 324 MG tablet Take 1 tablet (324 mg total) by mouth daily with breakfast.  3  . furosemide (LASIX) 20 MG tablet Take 20 mg by mouth daily.    [name] gabapentin (NEURONTIN) 100 MG capsule Take 100 mg by mouth at bedtime.    [name] glucose blood (ONETOUCH VERIO) test strip USE 1 STRIP TO CHECK GLUCOSE THREE TIMES DAILY  E11.21 300 each 12  . hydrALAZINE (APRESOLINE) 50 MG tablet Take 1 tablet (50 mg total) by mouth 2 (two) times daily. 60 tablet 6  . hydrOXYzine (ATARAX/VISTARIL) 10 MG tablet Take 1 tablet (10 mg total) by mouth 3 (three) times daily as needed for itching. 60 tablet 11  . insulin aspart (NOVOLOG FLEXPEN) 100 UNIT/ML FlexPen Before each meal 3 times a day, 140-199 - 2 units, 200-250 - 4 units, 251-299 - 6 units,  300-349 - 8 units,  350 or above 10 units. Insulin PEN if approved, provide syringes and needles if needed. 15 mL 0  . Insulin Glargine (TOUJEO SOLOSTAR) 300 UNIT/ML SOPN Inject 38 Units into the skin 2 (two) times daily. (before breakfast and before supper) 12 pen 0  . isosorbide mononitrate (IMDUR) 60 MG 24 hr tablet TAKE 1 TABLET BY MOUTH EVERY DAY 90 tablet 1  . Lancet Device MISC Onetouch verio machine lancets, check blood sugar twice daily as directed DX E11.69 100 each 3  . magnesium oxide (MAG-OX) 400 MG tablet Take 1 tablet (400 mg total) by mouth daily. 30 tablet 6  . meclizine (ANTIVERT) 25 MG tablet Take one tablet by mouth three times daily as needed for dizziness 60 tablet 0  . memantine (NAMENDA) 5 MG tablet TAKE 2 TABLETS BY MOUTH EVERY MORNING AND 1 TABLET EVERY EVENING FOR MEMORY 90 tablet 6  . metoprolol succinate (TOPROL-XL) 25 MG 24 hr tablet TAKE 1 TABLET BY MOUTH EVERY DAY FOR blood pressure 90 tablet 1  . montelukast (SINGULAIR) 10 MG tablet Take 10 mg by mouth at bedtime.    [name]  nitroGLYCERIN (NITROSTAT) 0.4 MG SL tablet Take one tablet under the tongue every 5 minutes as needed for chest pain 50 tablet 0  . oxyCODONE-acetaminophen  (PERCOCET) 10-325 MG tablet Take 1 tablet by mouth every 6 (six) hours. 120 tablet 0  . pantoprazole (PROTONIX) 40 MG tablet Take 1 tablet (40 mg total) by mouth daily. 90 tablet 3  . pravastatin (PRAVACHOL) 20 MG tablet TAKE 1 TABLET BY MOUTH EVERY DAY FOR cholesterol 90 tablet 1  . rivaroxaban (XARELTO) 10 MG TABS tablet Take 1 tablet (10 mg total) by mouth daily. 30 tablet 11  . Turmeric 500 MG CAPS Take 1 tablet by mouth daily.    [name] ULORIC 40 MG tablet Take 1 tablet (40 mg total) by mouth daily. 90 tablet 1  . vitamin B-12 (CYANOCOBALAMIN) 1000 MCG tablet Take 1,000 mcg by mouth daily.     No current facility-administered medications for this visit.     REVIEW OF SYSTEMS:   A 10+ POINT REVIEW OF SYSTEMS WAS OBTAINED including neurology, dermatology, psychiatry, cardiac, respiratory, lymph, extremities, GI, GU, Musculoskeletal, constitutional, breasts, reproductive, HEENT.  All pertinent positives are noted in the HPI.  All others are negative.   PHYSICAL EXAMINATION:  ECOG PERFORMANCE STATUS: 3 - Symptomatic, >50% confined to bed  Vitals:   11/27/17 1346  BP: (!) 147/73  Pulse: 78  Resp: 18  Temp: 97.8 F (36.6 C)  SpO2: 99%   Filed Weights   11/27/17 1346  Weight: (!) 325 lb 12.8 oz (147.8 kg)    GENERAL:alert, in no acute distress and comfortable SKIN: no acute rashes, no significant lesions EYES: conjunctiva are pink and non-injected, sclera anicteric OROPHARYNX: MMM, no exudates, no oropharyngeal erythema or ulceration NECK: supple, no JVD LYMPH:  no palpable lymphadenopathy in the cervical, axillary or inguinal regions.  LUNGS: clear to auscultation b/l with normal respiratory effort HEART: regular rate & rhythm ABDOMEN:  Obese, normoactive bowel sounds , non tender, not distended. Extremity: bilateral 1+ pedal edema PSYCH: alert & oriented x 3 with fluent speech NEURO: no focal motor/sensory deficits  BREAST: scribe chaperone present for exam, no breast lumps or  obvious palpable regional lymph nodes  LABORATORY DATA:  I have reviewed the data as listed  . CBC Latest Ref Rng & Units 11/27/2017 10/23/2017 10/10/2017  WBC 3.9 - 10.3 K/uL 11.8(H) 11.6(H) 12.1(H)  Hemoglobin 11.6 - 15.9 g/dL 9.1(L) 8.5(L) 8.1(L)  Hematocrit 34.8 - 46.6 % 30.3(L) 28.4(L) 27.3(L)  Platelets 145 - 400 K/uL 303 317 324   . CBC    Component Value Date/Time   WBC 11.8 (H) 11/27/2017 1326   RBC 3.65 (L) 11/27/2017 1326   HGB 9.1 (L) 11/27/2017 1326   HGB 8.5 (L) 10/23/2017 1418   HGB 8.7 (L) 03/21/2017 1351   HCT 30.3 (L) 11/27/2017 1326   HCT 28.9 (L) 03/21/2017 1351   PLT 303 11/27/2017 1326   PLT 317 10/23/2017 1418   PLT 295 03/21/2017 1351   MCV 83.0 11/27/2017 1326   MCV 84.3 03/21/2017 1351   MCH 24.9 (L) 11/27/2017 1326   MCHC 30.0 (L) 11/27/2017 1326   RDW 16.1 (H) 11/27/2017 1326   RDW 15.5 (H) 03/21/2017 1351   LYMPHSABS 2.3 11/27/2017 1326   LYMPHSABS 2.4 03/21/2017 1351   MONOABS 0.5 11/27/2017 1326   MONOABS 0.7 03/21/2017 1351   EOSABS 0.3 11/27/2017 1326   EOSABS 0.2 03/21/2017 1351   BASOSABS 0.0 11/27/2017 1326   BASOSABS 0.0 03/21/2017 1351   . CMP  Latest Ref Rng & Units 11/27/2017 10/24/2017 10/23/2017  Glucose 70 - 99 mg/dL 284(H) 211(H) 244(H)  BUN 8 - 23 mg/dL 32(H) 37(H) 35(H)  Creatinine 0.44 - 1.00 mg/dL 1.65(H) 1.86(H) 2.24(H)  Sodium 135 - 145 mmol/L 136 133(L) 134(L)  Potassium 3.5 - 5.1 mmol/L 5.5(H) 5.4(H) 4.5  Chloride 98 - 111 mmol/L 103 98 98  CO2 22 - 32 mmol/L '25 27 27  ' Calcium 8.9 - 10.3 mg/dL 9.7 8.8 9.0  Total Protein 6.5 - 8.1 g/dL 8.4(H) - 7.8  Total Bilirubin 0.3 - 1.2 mg/dL 0.3 - 0.4  Alkaline Phos 38 - 126 U/L 89 - 85  AST 15 - 41 U/L 10(L) - 13  ALT 0 - 44 U/L 9 - 7        RADIOGRAPHIC STUDIES: I have personally reviewed the radiological images as listed and agreed with the findings in the report.  Mm Diag Breast Tomo Bilateral  Result Date: 11/01/2017 CLINICAL DATA:  83 year old patient presents  for annual examination of both breasts. She is in a wheelchair. Both her daughters are here with her. She has a history of left breast cancer status post lumpectomy. Margins were positive at the time of lumpectomy, and due to the patient's comorbidities, it was decided not to perform re-excision. The patient has known suspicious microcalcifications posterior to the lumpectomy clips. EXAM: DIGITAL DIAGNOSTIC BILATERAL MAMMOGRAM WITH CAD AND TOMO COMPARISON:  Previous exam(s). ACR Breast Density Category b: There are scattered areas of fibroglandular density. FINDINGS: The images were performed with the patient in a wheelchair, as she reports difficulty with standing. Please note that some of the posterior tissue of both breasts is excluded the pectoralis muscles are not included in the MLO views. Multiple surgical clips are present at the lumpectomy site in the central retroareolar left breast. Linearly oriented calcifications in a segmental distribution of the slightly medial retroareolar left breast appear without mammographic change compared to the mammogram of April 2018 and are suspicious for residual ductal carcinoma in situ. The patient has bilateral vascular calcifications. No suspicious microcalcifications are identified in the right breast. No mass or nonsurgical distortion is seen bilaterally. Mammographic images were processed with CAD. IMPRESSION: No significant change in suspicious microcalcifications posterior to the lumpectomy site in the left breast compared to the mammogram of 2018. No suspicious findings in the right breast. Exclusion of some posterior tissue due to patient decreased mobility. RECOMMENDATION: Diagnostic mammogram is suggested in 1 year. (Code:DM-B-01Y) I have discussed the findings and recommendations with the patient. Results were also provided in writing at the conclusion of the visit. If applicable, a reminder letter will be sent to the patient regarding the next appointment.  BI-RADS CATEGORY  6: Known biopsy-proven malignancy. Electronically Signed   By: Curlene Dolphin M.D.   On: 11/01/2017 16:12   Diagnostic mammogram b/l 11/01/17  IMPRESSION: No significant change in suspicious microcalcifications posterior to the lumpectomy site in the left breast compared to the mammogram of 2018. No suspicious findings in the right breast. Exclusion of some posterior tissue due to patient decreased mobility.  RECOMMENDATION: Diagnostic mammogram is suggested in 1 year. (Code:DM-B-01Y)    CLINICAL DATA:  Annual mammography. The patient had surgery for DCIS in the left breast in 2016. There were positive margins at surgery but the patient could not return for additional surgery. Known suspicious calcifications remain. EXAM: 2D DIGITAL DIAGNOSTIC BILATERAL MAMMOGRAM WITH CAD AND ADJUNCT TOMO COMPARISON:  Previous exam(s). ACR Breast Density Category b: There are scattered  areas of fibroglandular density. FINDINGS: The suspicious calcifications posterior to the left lumpectomy site are stable. No other interval changes or other suspicious findings. Mammographic images were processed with CAD. IMPRESSION: Continued suspicious calcifications posterior to the left lumpectomy site. No other changes. RECOMMENDATION: Continued annual mammography for surveillance. Continued oncologic follow up. I have discussed the findings and recommendations with the patient. Results were also provided in writing at the conclusion of the visit. If applicable, a reminder letter will be sent to the patient regarding the next appointment. BI-RADS CATEGORY  6: Known biopsy-proven malignancy.   Electronically Signed   By: Dorise Bullion III M.D   On: 08/16/2016 15:21   ASSESSMENT & PLAN:   Mrs. Giglia is a very wonderful 83 y.o. African-American female with multiple medical comorbidities as described above with   #1 Left-sided presumed stage IA  (pT1c,pNx[cN0], cM0) invasive  ductal carcinoma grade 2 out of 3, strongly ER +100%, strongly PR +100% and HER-2/neu negative. Given her high risk cardiac status lumpectomy had to be done under local anesthesia and sentinel lymph node biopsy was not possible. She has accompanying DCIS of intermediate grade present at margins. ECOG performance status is 3. Dexa scan "low bone mass" per WHO. -Bone density scan on 03/12/2017 with results showing: T-score of 0.5 at left forearm. Lumbar spine and dual femurs not completed due to patient in a wheelchair and not able to lay flat due to SOB while laying flat.   #2 significant coronary artery disease with stress test in February 2016 suggesting likely multivessel disease. Has uncontrolled diabetes, hypertension, dyslipidemia, untreated sleep apnea, coronary disease, severe arthritis all of which are significantly limiting her quality of life. #3 vitamin D deficiency - 25OH vitamin D level of 10, s/p high dose ergocalciferol re-placement with 25OH Vit D improvement to 30.9 in 10/2017. Now on vit D 2000 units daily.   Plan -no clear clinical indication off breast cancer progression at this time.  -11/01/17 Mammogram stable.  -She did not scheduled  CT or bone scan to further evaluate body aches. She would like to observe for now until symptoms worseness.  -Labs reviewed with pt: Hg at 9.1, WBC at 11.8. She received IV Feraheme on 11/21/17, will get 2nd dose tomorrow.  -continue Arimidex 67m po daily - The pt has no prohibitive toxicities from continuing Arimidex at this time.  -continue vit D 2000 IU daily  -continue at least 1200-15056mpo calcium intake daily -Last Bone density study from 03/12/17 did not show signs of bone loss at her wrist -Recommend that PCP determine if increasing Gabapentin dose would be helpful for patient's generalized pain   #4 Worsening Anemia ? Slow GI losses . Multiple metabolic insults in the bone marrow including uncontrolled diabetes .Patient notes that her  fasting glucose and so and in the 300s . Plan   -She was recommended to follow-up with her primary care physician to optimize her diabetes management . -Regarding her newly developed picca, will add ferritin and EPO to labs on next visit -Begin PO Iron Polysaccharide  15090mo daily -- ferritin goal would to >100 in the setting of CKD -HG at 9.1 today,   RTC with Dr KalIrene Limbo 6 months with labs   Continue follow-up with your primary care physician Dr. CarEulas Postr other ongoing cares.  . The total time spent in the appointment was 25 minutes and more than 50% was on counseling and direct patient cares.   GauSullivan Lone MS [place]/Oncology Physician  [place]  (Office):       [phone] (Work cell):  [phone] (Fax):           [phone]  I, Joslyn Devon, am acting as scribe for Sullivan Lone, MD.   .I have reviewed the above documentation for accuracy and completeness, and I agree with the above. [name] MD

## 2017-11-27 NOTE — Telephone Encounter (Signed)
Scheduled appt per 7/23 los - gave patient AVS and calender per los.  

## 2017-11-28 ENCOUNTER — Other Ambulatory Visit: Payer: Self-pay

## 2017-11-28 ENCOUNTER — Ambulatory Visit (HOSPITAL_COMMUNITY)
Admission: RE | Admit: 2017-11-28 | Discharge: 2017-11-28 | Disposition: A | Discharge status: Home / Self Care | Payer: PPO | Source: Ambulatory Visit | Attending: Internal Medicine | Admitting: Internal Medicine

## 2017-11-28 DIAGNOSIS — D509 Iron deficiency anemia, unspecified: Secondary | ICD-10-CM | POA: Diagnosis not present

## 2017-11-28 DIAGNOSIS — K9589 Other complications of other bariatric procedure: Secondary | ICD-10-CM

## 2017-11-28 DIAGNOSIS — D508 Other iron deficiency anemias: Secondary | ICD-10-CM

## 2017-11-28 LAB — ERYTHROPOIETIN: Erythropoietin: 14.7 m[IU]/mL (ref 2.6–18.5)

## 2017-11-28 MED ORDER — FERUMOXYTOL INJECTION 510 MG/17 ML
510.0000 mg | INTRAVENOUS | Status: AC
  Administered 2017-11-28: 510 mg via INTRAVENOUS
  Filled 2017-11-28: qty 17

## 2017-11-28 NOTE — Patient Outreach (Signed)
[facility]) Care Management  11/28/2017  [name] Sep 11, 1934 [id number]   RNCM present at client's home. She and her daughter pulled up to the car in there car stating they were unable to complete home visit. Client reports she just came from the doctor and was doing fine.  Plan: RNCM will call within the week to discuss next plan of care.  Thea Silversmith, RN, MSN, [place] Coordinator Cell: [phone]

## 2017-12-06 DIAGNOSIS — E875 Hyperkalemia: Secondary | ICD-10-CM | POA: Diagnosis not present

## 2017-12-06 DIAGNOSIS — E669 Obesity, unspecified: Secondary | ICD-10-CM | POA: Diagnosis not present

## 2017-12-06 DIAGNOSIS — R0689 Other abnormalities of breathing: Secondary | ICD-10-CM | POA: Diagnosis not present

## 2017-12-06 DIAGNOSIS — G8929 Other chronic pain: Secondary | ICD-10-CM | POA: Diagnosis not present

## 2017-12-06 DIAGNOSIS — I5040 Unspecified combined systolic (congestive) and diastolic (congestive) heart failure: Secondary | ICD-10-CM | POA: Diagnosis not present

## 2017-12-06 DIAGNOSIS — D649 Anemia, unspecified: Secondary | ICD-10-CM | POA: Diagnosis not present

## 2017-12-06 DIAGNOSIS — M1990 Unspecified osteoarthritis, unspecified site: Secondary | ICD-10-CM | POA: Diagnosis not present

## 2017-12-11 ENCOUNTER — Ambulatory Visit: Payer: PPO

## 2017-12-11 ENCOUNTER — Encounter: Payer: Self-pay | Admitting: Internal Medicine

## 2017-12-11 ENCOUNTER — Ambulatory Visit (INDEPENDENT_AMBULATORY_CARE_PROVIDER_SITE_OTHER): Payer: PPO | Admitting: Internal Medicine

## 2017-12-11 ENCOUNTER — Other Ambulatory Visit: Payer: Self-pay

## 2017-12-11 VITALS — BP 138/72 | HR 80 | Temp 98.7°F | Ht 67.0 in | Wt 327.0 lb

## 2017-12-11 DIAGNOSIS — I5032 Chronic diastolic (congestive) heart failure: Secondary | ICD-10-CM | POA: Diagnosis not present

## 2017-12-11 DIAGNOSIS — G894 Chronic pain syndrome: Secondary | ICD-10-CM

## 2017-12-11 DIAGNOSIS — D509 Iron deficiency anemia, unspecified: Secondary | ICD-10-CM

## 2017-12-11 DIAGNOSIS — N183 Chronic kidney disease, stage 3 unspecified: Secondary | ICD-10-CM

## 2017-12-11 DIAGNOSIS — Z794 Long term (current) use of insulin: Secondary | ICD-10-CM

## 2017-12-11 DIAGNOSIS — IMO0002 Reserved for concepts with insufficient information to code with codable children: Secondary | ICD-10-CM

## 2017-12-11 DIAGNOSIS — E1121 Type 2 diabetes mellitus with diabetic nephropathy: Secondary | ICD-10-CM | POA: Diagnosis not present

## 2017-12-11 DIAGNOSIS — E1140 Type 2 diabetes mellitus with diabetic neuropathy, unspecified: Secondary | ICD-10-CM

## 2017-12-11 DIAGNOSIS — E1165 Type 2 diabetes mellitus with hyperglycemia: Secondary | ICD-10-CM | POA: Diagnosis not present

## 2017-12-11 MED ORDER — GABAPENTIN 100 MG PO CAPS: 200.0000 mg | ORAL_CAPSULE | Freq: Every day | ORAL | 1 refills | Status: DC

## 2017-12-11 MED ORDER — FREESTYLE LIBRE 14 DAY READER DEVI: 1.0000 | Freq: Three times a day (TID) | 12 refills | Status: DC

## 2017-12-11 MED ORDER — INSULIN GLARGINE 300 UNIT/ML ~~LOC~~ SOPN: 40.0000 [IU] | PEN_INJECTOR | Freq: Two times a day (BID) | SUBCUTANEOUS | 3 refills | Status: DC

## 2017-12-11 MED ORDER — FREESTYLE LIBRE 14 DAY SENSOR MISC: 1.0000 [IU] | Freq: Three times a day (TID) | 12 refills | Status: DC

## 2017-12-11 NOTE — Patient Instructions (Addendum)
CHANGE GLUCOMETER TO FREESTYLE LIBRE FOR BETTER COMPLIANCE WITH CHECKING BLOOD SUGAR  INCREASE TOUJEO 40 UNITS INJECTION 2 TIMES DAILY  INCREASE GABAPENTIN 2 CAPS AT BEDTIME FOR NERVE PAIN  Continue other medications as ordered  Follow up with specialists as scheduled   Follow up in 3 mos with [name] for DM, HTN, hypelipidemia, AOCD

## 2017-12-11 NOTE — Patient Outreach (Signed)
[facility]) Care Management  12/11/2017  [name] [birth year] [id number]   Referral to Health Coach completed.  Thea Silversmith, RN, MSN, [place] Coordinator Cell: [phone]

## 2017-12-11 NOTE — Progress Notes (Signed)
Patient ID: [name], female   DOB: 08-Oct-1934, 83 y.o.   MRN: [id number]   Location:  [facility] OFFICE  Provider: DR Arletha Grippe  Code Status:  Goals of Care:  Advanced Directives 10/09/2017  Does Patient Have a Medical Advance Directive? Yes  Type of Advance Directive Living will  Does patient want to make changes to medical advance directive? No - Patient declined  Copy of [place] in Chart? -  Would patient like information on creating a medical advance directive? -  Pre-existing out of facility DNR order (yellow form or pink MOST form) -     Chief Complaint  Patient presents with  . Medical Management of Chronic Issues    Pt is being seen for a 3 month routine visit.     HPI: Patient is a 83 y.o. female seen today for medical management of chronic diseases.  Pt c/o neuropathy and pain too severe for finger sticks. She only checks BS BID but prefers daily due to pain. She takes gabapentin qhs. H/O recommended bone scan to assess for mets but she declined last month. She is a poor historian due to dementia. Hx obtained from chart and daughters.  Hx RLE DVT/hx PE - stable on xeralto. No longer on coumadin.  DM - BS 130-170s in the daytime and >350 in the evenings. She takes 4-10 units of novolog most days; Toujeo 38 units BID. She has polydipsia and polyphagia. A1c 9.8%. No low BS reactions. LDL 37  Chronic bronchitis - stable SOB on symbicort. She has severe cough that is improved on diabetic Tussin  Dry skin dermatitis - stable on atarax prn  Dementia - MMSE 26/30. Unchanged on namenda. She tried aricept but experienced GI upset/loose stools and stopped medication. Albumin 3.0  CKD - stage 3. Stable. Cr 1.65; K 5.5  AOCD - Hgb 9.1. Iron 55; ferritin 573. She likes to eat ice; followed by Dr Irene Limbo  Hx left breast CA (stage IA; pT1c;pNx;cM0; invasive ductal CA grade 2 of 3, strongly ER/PR , HER2 neg) with hx DCIS - stable; s/p lumpectomy under local  anesthesia with (+) margins (could not return for additional sx); her mammograms shows continues suspicious Ca2+ posterior to left lumpectomy site. Takes arimidex daily. Followed by oncology Dr Irene Limbo   Past Medical History:  Diagnosis Date  . Allergy    takes Mucinex daily as needed  . Anemia, unspecified   . Anxiety    takes Clonazepam daily as needed  . Arthritis   . CHF (congestive heart failure) (HCC)    takes Furosemide daily  . Coronary artery disease    a. s/p IMI 2004 tx with BMS to RCA;  b. s/p Promus DES to RCA 2/12 c. 06/2014 High risk stress test follow up cath 06/2014 showd patent stent--< med therapy for other mild to moedrate disease   . Coronary atherosclerosis of native coronary artery   . Depression    takes Cymbalta daily  . Diabetes mellitus    insulin daily  . Dysuria   . GERD (gastroesophageal reflux disease)    takes Protonix daily  . Gout, unspecified    takes Colchicine daily  . Headache    occasionally  . History of blood clots about 64yr ago   in legs-takes Coumadin   . Hyperlipidemia    takes Pravastatin daily  . Hypertension    takes Imdur and Metoprolol daily  . Insomnia   . Joint pain   . Joint  swelling   . Myocardial infarction ([place]) 2004  . Numbness   . Obstructive sleep apnea   . Osteoarthritis   . Osteoarthritis   . Osteoarthrosis, unspecified whether generalized or localized, lower leg   . Pain, chronic   . Polymyalgia rheumatica ([place])   . Pulmonary emboli ([place]) 9/13   felt to need lifelong anticoagulation  . Shortness of breath dyspnea    with exertion and has Albuterol inhaler prn  . Type II or unspecified type diabetes mellitus without mention of complication, uncontrolled   . Urinary incontinence    takes Linzess daily  . Vertigo    hx of;was taking Meclizine if needed    Past Surgical History:  Procedure Laterality Date  . ABDOMINAL HYSTERECTOMY     partial  . APPENDECTOMY    . blood clots/legs and lungs  2013  .  BREAST BIOPSY Left 07/22/2014  . BREAST BIOPSY Left 02/10/2013  . BREAST LUMPECTOMY Left 11/05/2014  . BREAST LUMPECTOMY WITH RADIOACTIVE SEED LOCALIZATION Left 11/05/2014   Procedure: LEFT BREAST LUMPECTOMY WITH RADIOACTIVE SEED LOCALIZATION;  Surgeon: [name], MD;  Location: [facility];  Service: General;  Laterality: Left;  . CARDIAC CATHETERIZATION    . COLONOSCOPY    . CORONARY ANGIOPLASTY  2  . ESOPHAGOGASTRODUODENOSCOPY (EGD) WITH PROPOFOL N/A 11/07/2016   Procedure: ESOPHAGOGASTRODUODENOSCOPY (EGD) WITH PROPOFOL;  Surgeon: Gatha Mayer, MD;  Location: [facility];  Service: Endoscopy;  Laterality: N/A;  . EXCISION OF SKIN TAG Right 11/05/2014   Procedure: EXCISION OF RIGHT EYELID SKIN TAG;  Surgeon: [name], MD;  Location: [facility];  Service: General;  Laterality: Right;  . EYE SURGERY Bilateral    cataract   . GASTRIC BYPASS  1977    reversed in 1979, [place] N/A 06/29/2014   Procedure: LEFT HEART CATHETERIZATION WITH CORONARY ANGIOGRAM;  Surgeon: Troy Sine, MD;  Location: [facility] CATH LAB;  Service: Cardiovascular;  Laterality: N/A;  . MI with stent placement  2004     reports that she has never smoked. She has never used smokeless tobacco. She reports that she does not drink alcohol or use drugs. Social History   Socioeconomic History  . Marital status: Widowed    Spouse name: Not on file  . Number of children: Not on file  . Years of education: Not on file  . Highest education level: Not on file  Occupational History  . Occupation: retired  Scientific laboratory technician  . Financial resource strain: Not hard at all  . Food insecurity:    Worry: Never true    Inability: Never true  . Transportation needs:    Medical: Yes    Non-medical: Yes  Tobacco Use  . Smoking status: Never Smoker  . Smokeless tobacco: Never Used  Substance and Sexual Activity  . Alcohol use: No    Alcohol/week: 0.0 oz  . Drug use: No  .  Sexual activity: Not Currently  Lifestyle  . Physical activity:    Days per week: 0 days    Minutes per session: 0 min  . Stress: Rather much  Relationships  . Social connections:    Talks on phone: Once a week    Gets together: Three times a week    Attends religious service: 1 to 4 times per year    Active member of club or organization: Yes    Attends meetings of clubs or organizations: 1 to 4 times per year  Relationship status: Widowed  . Intimate partner violence:    Fear of current or ex partner: No    Emotionally abused: No    Physically abused: No    Forced sexual activity: No  Other Topics Concern  . Not on file  Social History Narrative  . Not on file    Family History  Problem Relation Age of Onset  . Breast cancer Mother 23  . Heart disease Mother   . Throat cancer Father   . Hypertension Father   . Arthritis Father   . Diabetes Father   . Arthritis Sister   . Obesity Sister   . Diabetes Sister   . Heart disease Cousin   . Colon cancer Neg Hx   . Stomach cancer Neg Hx   . Esophageal cancer Neg Hx     Allergies  Allergen Reactions  . Sulfonamide Derivatives Swelling    Mouth swelling  . Tramadol Nausea And Vomiting  . Aricept [Donepezil Hcl]     GI upset/loose stools    Outpatient Encounter Medications as of 12/11/2017  Medication Sig  . albuterol (PROVENTIL HFA;VENTOLIN HFA) 108 (90 Base) MCG/ACT inhaler Inhale 1-2 puffs into the lungs every 6 (six) hours as needed for wheezing or shortness of breath.  . anastrozole (ARIMIDEX) 1 MG tablet TAKE 1 TABLET BY MOUTH daily  . Biotin 10000 MCG TABS Take 1 tablet by mouth daily.  . budesonide-formoterol (SYMBICORT) 160-4.5 MCG/ACT inhaler Inhale 2 puffs into the lungs 2 (two) times daily. For bronchitis  . Cholecalciferol (VITAMIN D3) 5000 units CAPS Take 1 capsule by mouth daily.  . clonazePAM (KLONOPIN) 1 MG tablet TAKE 2 TABLETS BY MOUTH AT BEDTIME AS NEEDED FOR ANXIETY  . CRANBERRY EXTRACT PO Take 1  tablet by mouth daily.  . DULoxetine (CYMBALTA) 60 MG capsule Take one capsule by mouth once daily for anxiety  . estradiol (ESTRACE) 0.1 MG/GM vaginal cream Use every other evening use in vaginal area    Uses as needed  . ferrous gluconate (FERGON) 324 MG tablet Take 1 tablet (324 mg total) by mouth daily with breakfast.  . furosemide (LASIX) 20 MG tablet Take 20 mg by mouth daily.  [name] gabapentin (NEURONTIN) 100 MG capsule Take 100 mg by mouth at bedtime.  [name] glucose blood (ONETOUCH VERIO) test strip USE 1 STRIP TO CHECK GLUCOSE THREE TIMES DAILY  E11.21  . hydrALAZINE (APRESOLINE) 50 MG tablet Take 1 tablet (50 mg total) by mouth 2 (two) times daily.  . hydrOXYzine (ATARAX/VISTARIL) 10 MG tablet Take 1 tablet (10 mg total) by mouth 3 (three) times daily as needed for itching.  . insulin aspart (NOVOLOG FLEXPEN) 100 UNIT/ML FlexPen Before each meal 3 times a day, 140-199 - 2 units, 200-250 - 4 units, 251-299 - 6 units,  300-349 - 8 units,  350 or above 10 units. Insulin PEN if approved, provide syringes and needles if needed.  . Insulin Glargine (TOUJEO SOLOSTAR) 300 UNIT/ML SOPN Inject 38 Units into the skin 2 (two) times daily. (before breakfast and before supper)  . isosorbide mononitrate (IMDUR) 60 MG 24 hr tablet TAKE 1 TABLET BY MOUTH EVERY DAY  . Lancet Device MISC Onetouch verio machine lancets, check blood sugar twice daily as directed DX E11.69  . magnesium oxide (MAG-OX) 400 MG tablet Take 1 tablet (400 mg total) by mouth daily.  . meclizine (ANTIVERT) 25 MG tablet Take one tablet by mouth three times daily as needed for dizziness  . memantine (NAMENDA) 5 MG  tablet TAKE 2 TABLETS BY MOUTH EVERY MORNING AND 1 TABLET EVERY EVENING FOR MEMORY  . metoprolol succinate (TOPROL-XL) 25 MG 24 hr tablet TAKE 1 TABLET BY MOUTH EVERY DAY FOR blood pressure  . montelukast (SINGULAIR) 10 MG tablet Take 10 mg by mouth at bedtime.  . nitroGLYCERIN (NITROSTAT) 0.4 MG SL tablet Take one tablet under the  tongue every 5 minutes as needed for chest pain  . oxyCODONE-acetaminophen (PERCOCET) 10-325 MG tablet Take 1 tablet by mouth every 6 (six) hours.  . pantoprazole (PROTONIX) 40 MG tablet Take 1 tablet (40 mg total) by mouth daily.  . pravastatin (PRAVACHOL) 20 MG tablet TAKE 1 TABLET BY MOUTH EVERY DAY FOR cholesterol  . rivaroxaban (XARELTO) 10 MG TABS tablet Take 1 tablet (10 mg total) by mouth daily.  . Turmeric 500 MG CAPS Take 1 tablet by mouth daily.  [name] ULORIC 40 MG tablet Take 1 tablet (40 mg total) by mouth daily.  . vitamin B-12 (CYANOCOBALAMIN) 1000 MCG tablet Take 1,000 mcg by mouth daily.   No facility-administered encounter medications on file as of 12/11/2017.     Review of Systems:  Review of Systems  Unable to perform ROS: Dementia    Health Maintenance  Topic Date Due  . OPHTHALMOLOGY EXAM  04/25/2017  . INFLUENZA VACCINE  12/06/2017  . FOOT EXAM  12/12/2017  . HEMOGLOBIN A1C  04/10/2018  . TETANUS/TDAP  05/08/2018  . MAMMOGRAM  11/02/2018  . DEXA SCAN  Completed  . PNA vac Low Risk Adult  Completed    Physical Exam: Vitals:   12/11/17 1144  BP: 138/72  Pulse: 80  Temp: 98.7 F (37.1 C)  TempSrc: Oral  SpO2: 96%  Weight: (!) 327 lb (148.3 kg)  Height: '5\' 7"'  (1.702 m)   Body mass index is 51.22 kg/m. Physical Exam  Constitutional: She is oriented to person, place, and time. She appears well-developed and well-nourished.  HENT:  Mouth/Throat: Oropharynx is clear and moist. No oropharyngeal exudate.  MMM; no oral thrush  Eyes: Pupils are equal, round, and reactive to light. No scleral icterus.  Neck: Neck supple. Carotid bruit is not present. No tracheal deviation present. No thyromegaly present.  Cardiovascular: Normal rate, regular rhythm and intact distal pulses. Exam reveals no gallop and no friction rub.  Murmur (1/6 SEM) heard. Trace LE edema b/l. No calf TTP  Pulmonary/Chest: Effort normal. No stridor. No respiratory distress. She has wheezes  (end expiratory b/l). She has no rales.  Decreased BS b/l at base  Abdominal: Soft. Normal appearance and bowel sounds are normal. She exhibits no distension and no mass. There is no hepatomegaly. There is no tenderness. There is no rigidity, no rebound and no guarding. No hernia.  obese  Musculoskeletal: She exhibits edema and tenderness.  Lymphadenopathy:    She has no cervical adenopathy.  Neurological: She is alert and oriented to person, place, and time. She has normal reflexes.  Skin: Skin is warm and dry. No rash noted.  Psychiatric: She has a normal mood and affect. Her behavior is normal. Thought content normal.    Labs reviewed: Basic Metabolic Panel: Recent Labs    10/09/17 0049 10/09/17 0715 10/10/17 0448 10/23/17 1418 10/24/17 1227 11/27/17 1326  NA  --  139 134* 134* 133* 136  K  --  3.8 4.1 4.5 5.4* 5.5*  CL  --  99* 101 98 98 103  CO2  --  '29 28 27 27 25  ' GLUCOSE  --  214*  273* 244* 211* 284*  BUN  --  22* 25* 35* 37* 32*  CREATININE  --  1.23* 1.47* 2.24* 1.86* 1.65*  CALCIUM  --  9.1 8.4* 9.0 8.8 9.7  MG  --  1.2* 1.5*  --  1.4*  --   PHOS  --  3.7  --   --   --   --   TSH 0.884  --   --   --   --   --    Liver Function Tests: Recent Labs    10/09/17 0715 10/23/17 1418 11/27/17 1326  AST 12* 13 10*  ALT 11* 7 9  ALKPHOS 76 85 89  BILITOT 0.5 0.4 0.3  PROT 8.0 7.8 8.4*  ALBUMIN 2.8* 2.8* 3.0*   No results for input(s): LIPASE, AMYLASE in the last 8760 hours. No results for input(s): AMMONIA in the last 8760 hours. CBC: Recent Labs    08/02/17 1346  10/10/17 0448 10/23/17 1418 11/27/17 1326  WBC 10.1   < > 12.1* 11.6* 11.8*  NEUTROABS 6,929  --   --  7.4* 8.7*  HGB 8.2*   < > 8.1* 8.5* 9.1*  HCT 25.5*   < > 27.3* 28.4* 30.3*  MCV 79.9*   < > 82.5 82.6 83.0  PLT 304   < > 324 317 303   < > = values in this interval not displayed.   Lipid Panel: Recent Labs    01/16/17 1516 06/08/17 1015  CHOL 114 92  HDL 38* 38*  LDLCALC 53 37    TRIG 149 89  CHOLHDL 3.0 2.4   Lab Results  Component Value Date   HGBA1C 9.8 (H) 10/09/2017    Procedures since last visit: No results found.  Assessment/Plan   ICD-10-CM   1. Type 2 diabetes mellitus with diabetic neuropathy, with long-term current use of insulin (HCC) E11.40 gabapentin (NEURONTIN) 100 MG capsule   Z79.4 Continuous Blood Gluc Receiver (FREESTYLE LIBRE 14 DAY READER) DEVI    Continuous Blood Gluc Sensor (FREESTYLE LIBRE 14 DAY SENSOR) MISC    Insulin Glargine (TOUJEO SOLOSTAR) 300 UNIT/ML SOPN  2. Microcytic anemia D50.9   3. Chronic pain syndrome G89.4   4. Chronic diastolic congestive heart failure (HCC) I50.32   5. CKD (chronic kidney disease) stage 3, GFR 30-59 ml/min (HCC) N18.3   6. Uncontrolled type 2 diabetes mellitus with diabetic nephropathy, with long-term current use of insulin (HCC) E11.21 Continuous Blood Gluc Receiver (FREESTYLE LIBRE 14 DAY READER) DEVI   E11.65 Continuous Blood Gluc Sensor (FREESTYLE LIBRE 14 DAY SENSOR) MISC   Z79.4 Insulin Glargine (TOUJEO SOLOSTAR) 300 UNIT/ML SOPN     Check BS qAC and qHS - DM uncontrolled  CHANGE GLUCOMETER TO FREESTYLE LIBRE FOR BETTER COMPLIANCE WITH CHECKING BLOOD SUGAR  INCREASE TOUJEO 40 UNITS INJECTION 2 TIMES DAILY  INCREASE GABAPENTIN 2 CAPS AT BEDTIME FOR NERVE PAIN  Continue other medications as ordered  Follow up with specialists as scheduled   Follow up in 3 mos with [name] for DM, HTN, hypelipidemia, AOCD  Yadir Zentner S. Perlie Gold  [facility] [address] [place], [place] [address] [phone] Cell (Monday-Friday 8 AM - 5 PM) [phone] After 5 PM and follow prompts

## 2017-12-11 NOTE — Patient Outreach (Signed)
[name] [facility]) Care Management  12/11/2017  [name] 25-Jul-1934 [id number]   RNCM called to follow up and spoke with client's daughter, [name] (care-giver/on consent). She reports that client is doing well. She states client has an appointment with her primary care on today.  [name] states that client has not been weighing because she is not getting an accurate weight. [name] states that client has been watching the amount of salt she is eating.   [name] denies any care management needs and agrees to being transitioned back to the health coach for disease management.  She reports that she will be taking client back to her home town in [place] on August 13 and they will be back after labor day. RNCM reinforced importance continuing to monitor salt intake, fluid intake and taking medications while she is out of town.  Plan: transition to health coach. [facility] CM Care Plan Problem One     Most Recent Value  Care Plan Problem One  at risk for readmission as evidence of recent hospitalization.  Role Documenting the Problem One  Care Management Coordinator  Care Plan for Problem One  Not Active  [facility] Long Term Goal   client will not have readmission within the next 31-45 days.  [facility] Long Term Goal Start Date  10/15/17  [facility] Long Term Goal Met Date  12/11/17  [facility] CM Short Term Goal #1   client/family will verbalize start of care with home health agency within the next 1-2 weeks.  [facility] CM Short Term Goal #1 Start Date  10/15/17  [facility] CM Short Term Goal #1 Met Date  10/25/17  [facility] CM Short Term Goal #2   client will begin to weigh self daily within the next 1-2 weeks.  [facility] CM Short Term Goal #2 Start Date  10/15/17  Interventions for Short Term Goal #2  RNCM reinforced the importance of daily weights and reemphasized other indicators of increased fluid or heart failure exacerbation.    [facility] CM Care Plan Problem Two     Most Recent Value  Care Plan Problem Two  medication  adherence  Role Documenting the Problem Two  Care Management Coordinator  Care Plan for Problem Two  Not Active  [facility] CM Short Term Goal #1   client will take hydralazine as ordered.  [facility] CM Short Term Goal #1 Start Date  10/25/17  [facility] CM Short Term Goal #1 Met Date   12/11/17  [facility] CM Short Term Goal #2   client will report decreased sodium intake.  [facility] CM Short Term Goal #2 Start Date  11/02/17  [facility] CM Short Term Goal #2 Met Date  12/11/17    [facility] CM Care Plan Problem Three     Most Recent Value  Care Plan Problem Three  knowledge deficit related heart failure disease management needs continued  Role Documenting the Problem Three  Care Management Coordinator  Care Plan for Problem Three  Active      Thea Silversmith, RN, MSN, [place] Coordinator Cell: [phone]

## 2017-12-13 DIAGNOSIS — E1169 Type 2 diabetes mellitus with other specified complication: Secondary | ICD-10-CM | POA: Insufficient documentation

## 2017-12-13 DIAGNOSIS — E1140 Type 2 diabetes mellitus with diabetic neuropathy, unspecified: Secondary | ICD-10-CM | POA: Insufficient documentation

## 2017-12-13 DIAGNOSIS — Z794 Long term (current) use of insulin: Principal | ICD-10-CM

## 2017-12-17 ENCOUNTER — Other Ambulatory Visit: Payer: Self-pay

## 2017-12-17 DIAGNOSIS — G894 Chronic pain syndrome: Secondary | ICD-10-CM

## 2017-12-17 MED ORDER — OXYCODONE-ACETAMINOPHEN 10-325 MG PO TABS
1.0000 | ORAL_TABLET | Freq: Four times a day (QID) | ORAL | 0 refills | Status: DC
Start: 2017-12-17 — End: 2018-01-18

## 2017-12-17 NOTE — Telephone Encounter (Signed)
Patient aware rx sent. Patient also aware of Dr.Carter's response to getting rx towards the beginning of the month vs the 17th

## 2017-12-17 NOTE — Telephone Encounter (Signed)
Answer remains NO

## 2017-12-17 NOTE — Telephone Encounter (Signed)
1.) Incoming call received from patient requesting early release of Oxycodone. Patient will leave to go out of town on Wednesday 12/19/17. Patient will not return until the end of September. RX Last Filled 11/21/17. Send RX to [facility]    2.) Patient also asked that going forward if she can get rx at the beginning of the month vs the 17th. I informed patient that she asked this question before and the response was no. Patient would like the question reviewed again    Database verified and compliance confirmed    Please advise on both concerns

## 2017-12-26 ENCOUNTER — Encounter: Payer: Self-pay | Admitting: Internal Medicine

## 2018-01-06 DIAGNOSIS — G8929 Other chronic pain: Secondary | ICD-10-CM | POA: Diagnosis not present

## 2018-01-06 DIAGNOSIS — E669 Obesity, unspecified: Secondary | ICD-10-CM | POA: Diagnosis not present

## 2018-01-06 DIAGNOSIS — R0689 Other abnormalities of breathing: Secondary | ICD-10-CM | POA: Diagnosis not present

## 2018-01-06 DIAGNOSIS — I5040 Unspecified combined systolic (congestive) and diastolic (congestive) heart failure: Secondary | ICD-10-CM | POA: Diagnosis not present

## 2018-01-06 DIAGNOSIS — D649 Anemia, unspecified: Secondary | ICD-10-CM | POA: Diagnosis not present

## 2018-01-06 DIAGNOSIS — M1990 Unspecified osteoarthritis, unspecified site: Secondary | ICD-10-CM | POA: Diagnosis not present

## 2018-01-06 DIAGNOSIS — E875 Hyperkalemia: Secondary | ICD-10-CM | POA: Diagnosis not present

## 2018-01-09 ENCOUNTER — Other Ambulatory Visit: Payer: Self-pay | Admitting: Internal Medicine

## 2018-01-11 ENCOUNTER — Other Ambulatory Visit: Payer: Self-pay | Admitting: Internal Medicine

## 2018-01-14 ENCOUNTER — Other Ambulatory Visit: Payer: Self-pay | Admitting: *Deleted

## 2018-01-14 ENCOUNTER — Encounter: Payer: Self-pay | Admitting: *Deleted

## 2018-01-14 NOTE — Patient Outreach (Addendum)
[facility]) Care Management  [place]  01/14/2018   [name] [birth year] [id number]   [place] Initial Assessment  Referral Date:  12/11/2017 Referral Source:  Transfer from [place] Reason for Referral:  Continued Disease Management Education/Chronic Care Improvement Program Insurance:  Health Team Advantage   Outreach Attempt:   Successful telephone outreach to patient and daughter, [name] for initial telephone assessment.  HIPAA verified with daughter, [name] (Release of Information on File).  Patient and daughter, [name] completed initial telephone assessment.  Social:  Patient lives at home with daughter, [name].  Requires assistance all ADLs except can feed herself and is dependent on all  IADLs.  Ambulates with walker and reports about 3 falls in the last year without injury.  Reports last fall was about a month ago where she lost her balance.  Patient is concerned and saddened about having to rely on her daughter, [name] for assistance, as Pamela's health has now deterioted related to arthritis.  Daughter transports patient to medical appointments.  DME in the home include:  CBG meter, hospital bed, blood pressure cuff, bedside commode, rolling walker, upper dentures, lower partial, shower chair with back, scale, and wheelchair.  Uses wheelchair outside of the home.  Reports hospital bed is uncomfortable and difficult to sleep in due to comfort.  Encouraged patient to contact DME agency and request different mattress or bed.  Conditions:  Per chart review and discussion with patient and daughter, PMH include but not limited to:  Diabetes, hypertension, hyperlipidemia, coronary artery disease, congestive heart failure, insomnia, COPD, chronic bronchitis, gout, anemia, depression, tension headache, polymyalgia rheumatica, DVT, GERD, pulmonary emboli, osteoarthritis, obstructive sleep apnea, vertigo, chronic kidney disease, anxiety,  dysuria, MI, dementia, left breast cancer with lumpectomy, and appendectomy.  Reports last hospitalization was related to heart failure exacerbation.  States she knows she should weigh herself daily but she can not do on her own and her daughter, [name] is not functionally able to assist her on the scale.  Reports she is not weighing herself at all.  Encouraged patient to seek assistance from her other daughter to weigh herself at home and discussed with patient the importance of daily weight monitoring.  Patient denying any increase in shortness of breath, but reports some swelling in her legs and feet due to sitting on the side of bed, that gets better after sleeping at night.  Reports last Hgb A1C is 9.8 in June 2019.  Wears continuous glucose monitor Freestyle Libre and monitors blood sugars about 3 times a day.  Fasting blood sugar was 118 this morning with fasting ranges of 110-160's.  Patient and daughter both state her diet compliance to low salt carbohydrate modified has been a lot better.  Admits she is more depressed lately due to her feeling like a burden to her daughter whom is not doing well medically and patient's inability to sleep.  Encouraged patient to discuss sleep with primary care provider and [facility] Social Work referral discussed with patient and patient verbally agrees to referral.  Medications:  Daughter verbalizes patient takes about 7 medications, but on medication review and discussion with patient, patient taking about 23 medications.  Receives pill packs from [employer] and daughter helps manage and administer medications.  Patient self administers insulin with dosage assistance from daughter.  Reports receiving medication assistance for Toujeo injections and has an application for [employer] assistance daughter needs to complete.  Encouraged daughter to complete medication assistance application and  to return to [place].  Encounter Medications:  Outpatient Encounter  Medications as of 01/14/2018  Medication Sig Note  . albuterol (PROVENTIL HFA;VENTOLIN HFA) 108 (90 Base) MCG/ACT inhaler Inhale 1-2 puffs into the lungs every 6 (six) hours as needed for wheezing or shortness of breath.   . anastrozole (ARIMIDEX) 1 MG tablet TAKE 1 TABLET BY MOUTH daily   . Biotin 10000 MCG TABS Take 1 tablet by mouth daily.   . budesonide-formoterol (SYMBICORT) 160-4.5 MCG/ACT inhaler Inhale 2 puffs into the lungs 2 (two) times daily. For bronchitis   . Cholecalciferol (VITAMIN D3) 5000 units CAPS Take 1 capsule by mouth daily.   . Continuous Blood Gluc Receiver (FREESTYLE LIBRE 14 DAY READER) DEVI 1 Device by Does not apply route 4 (four) times daily -  before meals and at bedtime. For diabetes   . Continuous Blood Gluc Sensor (FREESTYLE LIBRE 14 DAY SENSOR) MISC 1 Units by Does not apply route 4 (four) times daily -  before meals and at bedtime. For diabetes   . CRANBERRY EXTRACT PO Take 1 tablet by mouth daily. 01/14/2018: Reports taking PRN  . DULoxetine (CYMBALTA) 60 MG capsule Take one capsule by mouth once daily for anxiety   . estradiol (ESTRACE) 0.1 MG/GM vaginal cream Use every other evening use in vaginal area    Uses as needed 01/14/2018: Taking as needed  . furosemide (LASIX) 20 MG tablet Take 20 mg by mouth daily.   . hydrALAZINE (APRESOLINE) 50 MG tablet Take 1 tablet (50 mg total) by mouth 2 (two) times daily.   . hydrOXYzine (ATARAX/VISTARIL) 10 MG tablet Take 1 tablet (10 mg total) by mouth 3 (three) times daily as needed for itching.   . insulin aspart (NOVOLOG FLEXPEN) 100 UNIT/ML FlexPen Before each meal 3 times a day, 140-199 - 2 units, 200-250 - 4 units, 251-299 - 6 units,  300-349 - 8 units,  350 or above 10 units. Insulin PEN if approved, provide syringes and needles if needed.   . Insulin Glargine (TOUJEO SOLOSTAR) 300 UNIT/ML SOPN Inject 40 Units into the skin 2 (two) times daily. (before breakfast and before supper) 01/14/2018: Reports taking 38 units twice a  day  . isosorbide mononitrate (IMDUR) 60 MG 24 hr tablet TAKE 1 TABLET BY MOUTH EVERY DAY   . magnesium oxide (MAG-OX) 400 MG tablet Take 1 tablet (400 mg total) by mouth daily.   . meclizine (ANTIVERT) 25 MG tablet Take one tablet by mouth three times daily as needed for dizziness   . memantine (NAMENDA) 5 MG tablet TAKE 2 TABLETS BY MOUTH EVERY MORNING AND 1 TABLET EVERY EVENING FOR MEMORY   . metoprolol succinate (TOPROL-XL) 25 MG 24 hr tablet TAKE 1 TABLET BY MOUTH EVERY DAY FOR blood pressure   . nitroGLYCERIN (NITROSTAT) 0.4 MG SL tablet Take one tablet under the tongue every 5 minutes as needed for chest pain   . oxyCODONE-acetaminophen (PERCOCET) 10-325 MG tablet Take 1 tablet by mouth every 6 (six) hours.   . pantoprazole (PROTONIX) 40 MG tablet TAKE 1 TABLET BY MOUTH DAILY   . pravastatin (PRAVACHOL) 20 MG tablet TAKE 1 TABLET BY MOUTH EVERY DAY FOR cholesterol   . rivaroxaban (XARELTO) 10 MG TABS tablet Take 1 tablet (10 mg total) by mouth daily.   [name] ULORIC 40 MG tablet Take 1 tablet (40 mg total) by mouth daily.   . vitamin B-12 (CYANOCOBALAMIN) 1000 MCG tablet Take 1,000 mcg by mouth daily. 01/14/2018: Takes PRN  .  clonazePAM (KLONOPIN) 1 MG tablet TAKE 2 TABLETS BY MOUTH AT BEDTIME AS NEEDED FOR ANXIETY (Patient not taking: Reported on 01/14/2018) 01/14/2018: Reports not taking  . ferrous gluconate (FERGON) 324 MG tablet Take 1 tablet (324 mg total) by mouth daily with breakfast. (Patient not taking: Reported on 01/14/2018) 01/14/2018: Reports not taking  . gabapentin (NEURONTIN) 100 MG capsule Take 2 capsules (200 mg total) by mouth at bedtime. For nerve pain (Patient not taking: Reported on 01/14/2018) 01/14/2018: Reports not taking  . glucose blood (ONETOUCH VERIO) test strip USE 1 STRIP TO CHECK GLUCOSE THREE TIMES DAILY  E11.21   . Lancet Device MISC Onetouch verio machine lancets, check blood sugar twice daily as directed DX E11.69   . montelukast (SINGULAIR) 10 MG tablet Take 10 mg by mouth  at bedtime. 01/14/2018: Reports not taking  . Turmeric 500 MG CAPS Take 1 tablet by mouth daily. 01/14/2018: Reports not taking   No facility-administered encounter medications on file as of 01/14/2018.     Functional Status:  In your present state of health, do you have any difficulty performing the following activities: 01/14/2018 10/25/2017  Hearing? N Y  [place]? N Y  Comment wears glasses -  Difficulty concentrating or making decisions? [name]  Walking or climbing stairs? Y Y  Dressing or bathing? Y Y  Doing errands, shopping? [name]  Comment family assist with errands -  Preparing Food and eating ? Y -  Using the Toilet? Y -  In the past six months, have you accidently leaked urine? Y -  Do you have problems with loss of bowel control? N -  Managing your Medications? Y -  Managing your Finances? Y -  Housekeeping or managing your Housekeeping? Y -  Some recent data might be hidden    Fall/Depression Screening: Fall Risk  01/14/2018 12/11/2017 10/25/2017  Falls in the past year? Yes No Yes  Number falls in past yr: 2 or more - 2 or more  Comment - - -  Injury with Fall? No - No  Comment - - -  Risk Factor Category  High Fall Risk - High Fall Risk  Risk for fall due to : Impaired mobility;Impaired balance/gait;History of fall(s);Impaired vision;Medication side effect - History of fall(s);Impaired mobility  Follow up Falls prevention discussed;Education provided - Falls prevention discussed   PHQ 2/9 Scores 01/14/2018 10/25/2017 08/17/2017 06/25/2017 06/19/2017 06/08/2017 04/02/2017  PHQ - 2 Score 3 3 - 0 0 0 3  PHQ- 9 Score 9 7 - - - - 10  Exception Documentation - - Other- indicate reason in comment box - - - -  Not completed - - spoke with daughter, did not speak with patient - - - -    THN CM Care Plan Problem One     Most Recent Value  Care Plan Problem One  Knowledge deficiet related to self care management of Diabetes and heart failure.  Role Documenting the Problem One   [place] for Problem One  Active  [facility] Long Term Goal   Patient and daughter will report no emergency room visits or hospital admission in the next 90 days.  THN Long Term Goal Start Date  01/14/18  Interventions for Problem One Long Term Goal  Current care plan and goals reviewed and discussed with patient and daughter, encouraged to keep and attend medical appointments, reviewed medications and indications, encouraged medication compliance, encouraged daughter to complete medication assistance form for uloric,  encouraged patient to discuss depression with [facility] Social Worker, [facility] Social Work referral placed to assist with depression resources, fall precautions reviewed and discussed  THN CM Short Term Goal #1   Patient and daughter will report recieving Living Better with Heart Failure Educational Packet in the next 30 days.  THN CM Short Term Goal #1 Start Date  01/14/18  Interventions for Short Term Goal #1  Sending Living Better with Heart Failure Educational package, encouraged patient to review packet with her daughter, reviewed signs and symptoms of heart failure with patient, reviewed heart failure zones and action plan with patient, discussed importance of daily weight monitoring and encouraged to weigh daily when assistance is available to help her get on the scale  THN CM Short Term Goal #2   Patient will verbalize 3 diet changes made to reduce fasting blood sugars in the next 30 days.  THN CM Short Term Goal #2 Start Date  01/14/18  Interventions for Short Term Goal #2  Reviewed latest increase in Hgb A1C with patient along with previous results, discussed ways to reduce Hgb A1C and blood sugar ranges, reviewed carbohydrate modified diet options with patient, encouraged patient to choice healthier meal and drink options,      Appointments:  Reports she last saw Dr. Eulas Post, primary care provider on 12/11/2017.  Daughter reporting Dr. Eulas Post is leaving the practice and patient has  scheduled follow  appointment with [name] NP on 03/13/2018.  Scheduled Cardiology appointment with Truitt Merle NP on 01/16/2018.  Advanced Directives:  Reports having a Living Will and [place] in place and does not wish to make any changes at this time.   Consent:  [facility] services reviewed and discussed with patient and daughter.  They verbally agree to Disease Management outreaches and [facility] Social Work referral to assist with depression management  Plan: [place] will make next monthly outreach to patient in the month of October. RN Health Coach will send primary MD barriers letter. RN Health Coach will route initial telephone assessment note to primary MD. [place] will send patient Living Better with Heart Failure Educational Packet. RN Health Coach will send patient Living Well with Diabetes Booklet. RN Health Coach will send [facility] SW referral for possible assistance with community resources related to depression.  [place] [phone] [email or web address]

## 2018-01-15 NOTE — Addendum Note (Signed)
Addended by: Hubert Azure D on: 01/15/2018 09:47 AM   Modules accepted: Orders

## 2018-01-16 ENCOUNTER — Ambulatory Visit: Payer: PPO | Admitting: Nurse Practitioner

## 2018-01-17 ENCOUNTER — Other Ambulatory Visit: Payer: Self-pay | Admitting: *Deleted

## 2018-01-17 ENCOUNTER — Encounter: Payer: Self-pay | Admitting: *Deleted

## 2018-01-17 NOTE — Patient Outreach (Signed)
[facility]) Care Management  01/17/2018  SOLOMIYA PASCALE 20-Oct-1934 [id number]   CSW made an initial attempt to try and contact patient today to perform the initial phone assessment, as well as assess and assist with social work needs and services, without success.  A HIPAA compliant message was left for patient on voicemail.  CSW is currently awaiting a return call.  CSW will make a second outreach attempt within the next 3-4 business days, if a return call is not received from patient in the meantime.  CSW will also mail an Outreach Letter to patient's home requesting that patient contact CSW if patient is interested in receiving social work services through [place] with Scientist, clinical (histocompatibility and immunogenetics). Nat Christen, BSW, MSW, LCSW  Licensed Education officer, environmental Health System  Mailing [place] [address] [place], [place] [address] Physical Address-[address], [place], [place] [address] Toll Free Main # [phone] Fax # [phone] Cell # [phone]  Office # [phone] [email or web address]

## 2018-01-18 ENCOUNTER — Other Ambulatory Visit: Payer: Self-pay

## 2018-01-18 DIAGNOSIS — G894 Chronic pain syndrome: Secondary | ICD-10-CM

## 2018-01-18 MED ORDER — OXYCODONE-ACETAMINOPHEN 10-325 MG PO TABS: 1.0000 | ORAL_TABLET | Freq: Four times a day (QID) | ORAL | 0 refills | Status: DC

## 2018-01-18 NOTE — Telephone Encounter (Signed)
Patient called to request a refill of oxycodone/apap 10-325 mg. Rx was pended to provider for approval  after verifying last fill date, provider, and quantity on PMP [place] database  Last refill was 12/17/17 for #120

## 2018-01-23 ENCOUNTER — Other Ambulatory Visit: Payer: Self-pay | Admitting: *Deleted

## 2018-01-23 DIAGNOSIS — L602 Onychogryphosis: Secondary | ICD-10-CM | POA: Diagnosis not present

## 2018-01-23 DIAGNOSIS — E1351 Other specified diabetes mellitus with diabetic peripheral angiopathy without gangrene: Secondary | ICD-10-CM | POA: Diagnosis not present

## 2018-01-23 NOTE — Patient Outreach (Signed)
[facility]) Care Management  01/23/2018  [name] 1934/08/15 [id number]   CSW made a second attempt to try and contact patient today to perform phone assessment, as well as assess and assist with social work needs and services, without success.  A HIPAA compliant message was left for patient on voicemail.  CSW continues to await a return call.  CSW will make a third and final outreach attempt within the next 3-4 business days, if a return call is not received from patient in the meantime.  CSW will then proceed with case closure if a return call is not received from patient with a total of 10 business days, as required number of phone attempts will have been made and outreach letter mailed.  Nat Christen, BSW, MSW, LCSW  Licensed Education officer, environmental Health System  Mailing [place] [address] [place], [place] [address] Physical Address-[address], [place], [place] [address] Toll Free Main # [phone] Fax # [phone] Cell # [phone]  Office # [phone] [email or web address]

## 2018-01-29 ENCOUNTER — Other Ambulatory Visit: Payer: Self-pay | Admitting: *Deleted

## 2018-01-29 DIAGNOSIS — L821 Other seborrheic keratosis: Secondary | ICD-10-CM | POA: Diagnosis not present

## 2018-01-29 DIAGNOSIS — L820 Inflamed seborrheic keratosis: Secondary | ICD-10-CM | POA: Diagnosis not present

## 2018-01-29 NOTE — Patient Outreach (Signed)
[facility]) Care Management  01/29/2018  [name] Dec 19, 1934 [id number]    CSW made a third and final attempt to try and contact patient today to perform phone assessment, as well as assess and assist with social work needs and services, without success.  A HIPAA compliant message was left for patient on voicemail.  CSW is currently awaiting a return call.  CSW will proceed with case closure in two business days, if a return call is not received in the meantime, as required number of phone attempts have been made and an outreach letter was mailed to patient's home allowing 10 business days for a response. Nat Christen, BSW, MSW, LCSW  Licensed Education officer, environmental Health System  Mailing [place] [address] [place], [place] [address] Physical Address-[address], [place], [place] [address] Toll Free Main # [phone] Fax # [phone] Cell # [phone]  Office # [phone] [email or web address]

## 2018-01-31 ENCOUNTER — Other Ambulatory Visit: Payer: Self-pay | Admitting: *Deleted

## 2018-01-31 NOTE — Patient Outreach (Signed)
[facility]) Care Management  01/31/2018  [name] 1934-07-20 [id number]   CSW will perform a case closure on patient, due to inability to establish initial phone contact, despite required number of phone attempts made and an outreach letter was mailed to patient's home allowing for 10 business days to respond.  CSW will notify patient's RNCM with [place] [facility], Hubert Azure of CSW's plans to close patient's case.  CSW will fax an update to patient's Primary Care Physician, Dr. Gildardo Cranker to ensure that they are aware of CSW's involvement with patient's plan of care.   Nat Christen, BSW, MSW, LCSW  Licensed Education officer, environmental Health System  Mailing [place] [address], [place], [place] [address] Physical Address[address], [place], [place] [address] Toll Free Main # [phone] Fax # [phone] Cell # [phone]  Office # [phone] [email or web address]

## 2018-02-05 DIAGNOSIS — D649 Anemia, unspecified: Secondary | ICD-10-CM | POA: Diagnosis not present

## 2018-02-05 DIAGNOSIS — M1990 Unspecified osteoarthritis, unspecified site: Secondary | ICD-10-CM | POA: Diagnosis not present

## 2018-02-05 DIAGNOSIS — G8929 Other chronic pain: Secondary | ICD-10-CM | POA: Diagnosis not present

## 2018-02-05 DIAGNOSIS — E875 Hyperkalemia: Secondary | ICD-10-CM | POA: Diagnosis not present

## 2018-02-05 DIAGNOSIS — E669 Obesity, unspecified: Secondary | ICD-10-CM | POA: Diagnosis not present

## 2018-02-05 DIAGNOSIS — I5040 Unspecified combined systolic (congestive) and diastolic (congestive) heart failure: Secondary | ICD-10-CM | POA: Diagnosis not present

## 2018-02-05 DIAGNOSIS — R0689 Other abnormalities of breathing: Secondary | ICD-10-CM | POA: Diagnosis not present

## 2018-02-11 ENCOUNTER — Other Ambulatory Visit: Payer: Self-pay | Admitting: *Deleted

## 2018-02-11 ENCOUNTER — Encounter: Payer: Self-pay | Admitting: *Deleted

## 2018-02-11 ENCOUNTER — Other Ambulatory Visit: Payer: Self-pay | Admitting: Internal Medicine

## 2018-02-11 NOTE — Patient Outreach (Signed)
[facility]) Care Management  02/11/2018  [name] [birth year] [id number]   RN Health Coach Monthly Outreach  Referral Date:  12/11/2017 Referral Source:  Transfer from [place] Reason for Referral:  Continued Disease Management Education/Chronic Care Improvement Program Insurance:  Health Team Advantage   Outreach Attempt:  Successful telephone outreach to patient for monthly follow up.  Patient able to verify HIPAA.  Sounds short of breath and hoarse initially in conversation.  States she is a little short of breath, but also states she has not had her inhaler yet today.  Instructed patient to take her inhaler, while RN Health Coach held on; per patient she just completed taking her inhaler while on call.  Sounding less short of breath.  Reporting she is still not weighing daily or weekly because she needs assistance to do so.  Reports fasting blood sugar this morning was 165 with fasting ranges below 200's.  Low sodium diabetic diet discussed and encouraged with patient.  Patient stating daughter not available to speak wit Health Coach at this time.  Denies any falls in the last month.  Discussed depression and [facility] Social Worker trying to reach patient, requesting RN Health Coach give Social Work contact information to daughter when she returns home.  Appointments:  Reports last seeing primary care provider on 12/11/2017 and has scheduled follow up appointment on 03/13/2018.  Plan: RN Health Coach will make next telephone outreach to patient in the month of December.  [place] [phone] [name]

## 2018-02-15 ENCOUNTER — Other Ambulatory Visit: Payer: Self-pay | Admitting: *Deleted

## 2018-02-15 DIAGNOSIS — G894 Chronic pain syndrome: Secondary | ICD-10-CM

## 2018-02-15 MED ORDER — OXYCODONE-ACETAMINOPHEN 10-325 MG PO TABS: 1.0000 | ORAL_TABLET | Freq: Four times a day (QID) | ORAL | 0 refills | Status: DC

## 2018-02-15 NOTE — Telephone Encounter (Signed)
Patient requested refill [place] Verified LR: 01/18/18 Confirmed Pharmacy Pended Rx and sent to Dr. Eulas Post for approval.

## 2018-02-16 ENCOUNTER — Ambulatory Visit (HOSPITAL_COMMUNITY)
Admission: EM | Admit: 2018-02-16 | Discharge: 2018-02-16 | Disposition: A | Discharge status: Home / Self Care | Payer: PPO | Attending: Family Medicine | Admitting: Family Medicine

## 2018-02-16 ENCOUNTER — Other Ambulatory Visit: Payer: Self-pay

## 2018-02-16 ENCOUNTER — Encounter (HOSPITAL_COMMUNITY): Payer: Self-pay

## 2018-02-16 DIAGNOSIS — Z882 Allergy status to sulfonamides status: Secondary | ICD-10-CM | POA: Insufficient documentation

## 2018-02-16 DIAGNOSIS — E785 Hyperlipidemia, unspecified: Secondary | ICD-10-CM | POA: Insufficient documentation

## 2018-02-16 DIAGNOSIS — Z86711 Personal history of pulmonary embolism: Secondary | ICD-10-CM | POA: Insufficient documentation

## 2018-02-16 DIAGNOSIS — G4733 Obstructive sleep apnea (adult) (pediatric): Secondary | ICD-10-CM | POA: Insufficient documentation

## 2018-02-16 DIAGNOSIS — Z888 Allergy status to other drugs, medicaments and biological substances status: Secondary | ICD-10-CM | POA: Insufficient documentation

## 2018-02-16 DIAGNOSIS — G4700 Insomnia, unspecified: Secondary | ICD-10-CM | POA: Insufficient documentation

## 2018-02-16 DIAGNOSIS — I2510 Atherosclerotic heart disease of native coronary artery without angina pectoris: Secondary | ICD-10-CM | POA: Insufficient documentation

## 2018-02-16 DIAGNOSIS — E119 Type 2 diabetes mellitus without complications: Secondary | ICD-10-CM | POA: Diagnosis not present

## 2018-02-16 DIAGNOSIS — F419 Anxiety disorder, unspecified: Secondary | ICD-10-CM | POA: Insufficient documentation

## 2018-02-16 DIAGNOSIS — K219 Gastro-esophageal reflux disease without esophagitis: Secondary | ICD-10-CM | POA: Insufficient documentation

## 2018-02-16 DIAGNOSIS — N898 Other specified noninflammatory disorders of vagina: Secondary | ICD-10-CM | POA: Diagnosis not present

## 2018-02-16 DIAGNOSIS — Z7901 Long term (current) use of anticoagulants: Secondary | ICD-10-CM | POA: Insufficient documentation

## 2018-02-16 DIAGNOSIS — N3001 Acute cystitis with hematuria: Secondary | ICD-10-CM | POA: Diagnosis not present

## 2018-02-16 DIAGNOSIS — Z794 Long term (current) use of insulin: Secondary | ICD-10-CM | POA: Diagnosis not present

## 2018-02-16 DIAGNOSIS — I252 Old myocardial infarction: Secondary | ICD-10-CM | POA: Diagnosis not present

## 2018-02-16 DIAGNOSIS — Z86718 Personal history of other venous thrombosis and embolism: Secondary | ICD-10-CM | POA: Insufficient documentation

## 2018-02-16 DIAGNOSIS — I110 Hypertensive heart disease with heart failure: Secondary | ICD-10-CM | POA: Insufficient documentation

## 2018-02-16 DIAGNOSIS — M353 Polymyalgia rheumatica: Secondary | ICD-10-CM | POA: Diagnosis not present

## 2018-02-16 DIAGNOSIS — I509 Heart failure, unspecified: Secondary | ICD-10-CM | POA: Insufficient documentation

## 2018-02-16 DIAGNOSIS — F329 Major depressive disorder, single episode, unspecified: Secondary | ICD-10-CM | POA: Insufficient documentation

## 2018-02-16 DIAGNOSIS — Z79899 Other long term (current) drug therapy: Secondary | ICD-10-CM | POA: Diagnosis not present

## 2018-02-16 LAB — POCT URINALYSIS DIP (DEVICE)
Bilirubin Urine: NEGATIVE
Glucose, UA: NEGATIVE mg/dL
Ketones, ur: NEGATIVE mg/dL
Nitrite: POSITIVE — AB
Protein, ur: 30 mg/dL — AB
Specific Gravity, Urine: 1.02 (ref 1.005–1.030)
Urobilinogen, UA: 0.2 mg/dL (ref 0.0–1.0)
pH: 5.5 (ref 5.0–8.0)

## 2018-02-16 MED ORDER — CEPHALEXIN 500 MG PO CAPS: 500.0000 mg | ORAL_CAPSULE | Freq: Two times a day (BID) | ORAL | 0 refills | Status: AC

## 2018-02-16 NOTE — ED Provider Notes (Signed)
[place]  CC: Urine odor  SUBJECTIVE:  [name] is a 83 y.o. female who complains of strong urine odor x 3 months.  Patient denies a precipitating event, recent sexual encounter, or excessive caffeine intake.  Has not tried OTC medications.  Symptoms are made worse with urination.  Admits to similar symptoms in the past. Complains of strong "fishy" smell. Denies fever, chills, nausea, vomiting, abdominal pain, flank pain, or hematuria.    Patient also mentions intermittent vaginal discharge for the past few months.  Describes discharge as brown with a fishy odor.    LMP: No LMP recorded. Patient is postmenopausal.  ROS: As in HPI.  Past Medical History:  Diagnosis Date  . Allergy    takes Mucinex daily as needed  . Anemia, unspecified   . Anxiety    takes Clonazepam daily as needed  . Arthritis   . CHF (congestive heart failure) (HCC)    takes Furosemide daily  . Coronary artery disease    a. s/p IMI 2004 tx with BMS to RCA;  b. s/p Promus DES to RCA 2/12 c. 06/2014 High risk stress test follow up cath 06/2014 showd patent stent--< med therapy for other mild to moedrate disease   . Coronary atherosclerosis of native coronary artery   . Depression    takes Cymbalta daily  . Diabetes mellitus    insulin daily  . Dysuria   . GERD (gastroesophageal reflux disease)    takes Protonix daily  . Gout, unspecified    takes Colchicine daily  . Headache    occasionally  . History of blood clots about 75yrs ago   in legs-takes Coumadin   . Hyperlipidemia    takes Pravastatin daily  . Hypertension    takes Imdur and Metoprolol daily  . Insomnia   . Joint pain   . Joint swelling   . Myocardial infarction ([place]) 2004  . Numbness   . Obstructive sleep apnea   . Osteoarthritis   . Osteoarthritis   . Osteoarthrosis, unspecified whether generalized or localized, lower leg   . Pain, chronic   . Polymyalgia rheumatica ([place])   . Pulmonary emboli ([place]) 9/13   felt to  need lifelong anticoagulation  . Shortness of breath dyspnea    with exertion and has Albuterol inhaler prn  . Type II or unspecified type diabetes mellitus without mention of complication, uncontrolled   . Urinary incontinence    takes Linzess daily  . Vertigo    hx of;was taking Meclizine if needed   Past Surgical History:  Procedure Laterality Date  . ABDOMINAL HYSTERECTOMY     partial  . APPENDECTOMY    . blood clots/legs and lungs  2013  . BREAST BIOPSY Left 07/22/2014  . BREAST BIOPSY Left 02/10/2013  . BREAST LUMPECTOMY Left 11/05/2014  . BREAST LUMPECTOMY WITH RADIOACTIVE SEED LOCALIZATION Left 11/05/2014   Procedure: LEFT BREAST LUMPECTOMY WITH RADIOACTIVE SEED LOCALIZATION;  Surgeon: [name], MD;  Location: [facility];  Service: General;  Laterality: Left;  . CARDIAC CATHETERIZATION    . COLONOSCOPY    . CORONARY ANGIOPLASTY  2  . ESOPHAGOGASTRODUODENOSCOPY (EGD) WITH PROPOFOL N/A 11/07/2016   Procedure: ESOPHAGOGASTRODUODENOSCOPY (EGD) WITH PROPOFOL;  Surgeon: Gatha Mayer, MD;  Location: [facility];  Service: Endoscopy;  Laterality: N/A;  . EXCISION OF SKIN TAG Right 11/05/2014   Procedure: EXCISION OF RIGHT EYELID SKIN TAG;  Surgeon: [name], MD;  Location: [facility];  Service: General;  Laterality: Right;  .  EYE SURGERY Bilateral    cataract   . GASTRIC BYPASS  1977    reversed in 1979, [place] N/A 06/29/2014   Procedure: LEFT HEART CATHETERIZATION WITH CORONARY ANGIOGRAM;  Surgeon: Troy Sine, MD;  Location: [facility] CATH LAB;  Service: Cardiovascular;  Laterality: N/A;  . MI with stent placement  2004   Allergies  Allergen Reactions  . Sulfonamide Derivatives Swelling    Mouth swelling  . Tramadol Nausea And Vomiting  . Aricept [Donepezil Hcl]     GI upset/loose stools   No current facility-administered medications on file prior to encounter.    Current Outpatient Medications on File Prior  to Encounter  Medication Sig Dispense Refill  . albuterol (PROVENTIL HFA;VENTOLIN HFA) 108 (90 Base) MCG/ACT inhaler Inhale 1-2 puffs into the lungs every 6 (six) hours as needed for wheezing or shortness of breath. 1 Inhaler 6  . anastrozole (ARIMIDEX) 1 MG tablet TAKE 1 TABLET BY MOUTH daily 90 tablet 1  . Biotin 10000 MCG TABS Take 1 tablet by mouth daily.    . budesonide-formoterol (SYMBICORT) 160-4.5 MCG/ACT inhaler Inhale 2 puffs into the lungs 2 (two) times daily. For bronchitis 1 Inhaler 6  . Cholecalciferol (VITAMIN D3) 5000 units CAPS Take 1 capsule by mouth daily.    . clonazePAM (KLONOPIN) 1 MG tablet TAKE 2 TABLETS BY MOUTH AT BEDTIME AS NEEDED FOR ANXIETY 60 tablet 0  . Continuous Blood Gluc Receiver (FREESTYLE LIBRE 14 DAY READER) DEVI 1 Device by Does not apply route 4 (four) times daily -  before meals and at bedtime. For diabetes 1 Device 12  . Continuous Blood Gluc Sensor (FREESTYLE LIBRE 14 DAY SENSOR) MISC 1 Units by Does not apply route 4 (four) times daily -  before meals and at bedtime. For diabetes 2 each 12  . CRANBERRY EXTRACT PO Take 1 tablet by mouth daily.    . DULoxetine (CYMBALTA) 60 MG capsule Take one capsule by mouth once daily for anxiety 90 capsule 1  . estradiol (ESTRACE) 0.1 MG/GM vaginal cream Use every other evening use in vaginal area    Uses as needed    . ferrous gluconate (FERGON) 324 MG tablet Take 1 tablet (324 mg total) by mouth daily with breakfast.  3  . furosemide (LASIX) 20 MG tablet TAKE 1 TABLET BY MOUTH EVERY DAY AS NEEDED 30 tablet 6  . hydrOXYzine (ATARAX/VISTARIL) 10 MG tablet Take 1 tablet (10 mg total) by mouth 3 (three) times daily as needed for itching. 60 tablet 11  . isosorbide mononitrate (IMDUR) 60 MG 24 hr tablet TAKE 1 TABLET BY MOUTH EVERY DAY 90 tablet 1  . magnesium oxide (MAG-OX) 400 MG tablet Take 1 tablet (400 mg total) by mouth daily. 30 tablet 6  . meclizine (ANTIVERT) 25 MG tablet Take one tablet by mouth three times  daily as needed for dizziness 60 tablet 0  . memantine (NAMENDA) 5 MG tablet TAKE 2 TABLETS BY MOUTH EVERY MORNING AND 1 TABLET EVERY EVENING FOR MEMORY 90 tablet 6  . metoprolol succinate (TOPROL-XL) 25 MG 24 hr tablet TAKE 1 TABLET BY MOUTH EVERY DAY FOR blood pressure 90 tablet 1  . nitroGLYCERIN (NITROSTAT) 0.4 MG SL tablet Take one tablet under the tongue every 5 minutes as needed for chest pain 50 tablet 0  . oxyCODONE-acetaminophen (PERCOCET) 10-325 MG tablet Take 1 tablet by mouth every 6 (six) hours. 120 tablet 0  . pantoprazole (PROTONIX) 40  MG tablet TAKE 1 TABLET BY MOUTH DAILY 90 tablet 3  . pravastatin (PRAVACHOL) 20 MG tablet TAKE 1 TABLET BY MOUTH EVERY DAY FOR cholesterol 90 tablet 1  . rivaroxaban (XARELTO) 10 MG TABS tablet Take 1 tablet (10 mg total) by mouth daily. 30 tablet 11  . ULORIC 40 MG tablet Take 1 tablet (40 mg total) by mouth daily. 90 tablet 1  . vitamin B-12 (CYANOCOBALAMIN) 1000 MCG tablet Take 1,000 mcg by mouth daily.    [name] glucose blood (ONETOUCH VERIO) test strip USE 1 STRIP TO CHECK GLUCOSE THREE TIMES DAILY  E11.21 300 each 12  . hydrALAZINE (APRESOLINE) 50 MG tablet Take 1 tablet (50 mg total) by mouth 2 (two) times daily. 60 tablet 6  . insulin aspart (NOVOLOG FLEXPEN) 100 UNIT/ML FlexPen Before each meal 3 times a day, 140-199 - 2 units, 200-250 - 4 units, 251-299 - 6 units,  300-349 - 8 units,  350 or above 10 units. Insulin PEN if approved, provide syringes and needles if needed. 15 mL 0  . Insulin Glargine (TOUJEO SOLOSTAR) 300 UNIT/ML SOPN Inject 40 Units into the skin 2 (two) times daily. (before breakfast and before supper) 12 pen 3  . Lancet Device MISC Onetouch verio machine lancets, check blood sugar twice daily as directed DX E11.69 100 each 3   Social History   Socioeconomic History  . Marital status: Widowed    Spouse name: Not on file  . Number of children: Not on file  . Years of education: Not on file  . Highest education level: Not on  file  Occupational History  . Occupation: retired  Scientific laboratory technician  . Financial resource strain: Not hard at all  . Food insecurity:    Worry: Never true    Inability: Never true  . Transportation needs:    Medical: Yes    Non-medical: Yes  Tobacco Use  . Smoking status: Never Smoker  . Smokeless tobacco: Never Used  Substance and Sexual Activity  . Alcohol use: No    Alcohol/week: 0.0 standard drinks  . Drug use: No  . Sexual activity: Not Currently  Lifestyle  . Physical activity:    Days per week: 0 days    Minutes per session: 0 min  . Stress: Rather much  Relationships  . Social connections:    Talks on phone: Once a week    Gets together: Three times a week    Attends religious service: 1 to 4 times per year    Active member of club or organization: Yes    Attends meetings of clubs or organizations: 1 to 4 times per year    Relationship status: Widowed  . Intimate partner violence:    Fear of current or ex partner: No    Emotionally abused: No    Physically abused: No    Forced sexual activity: No  Other Topics Concern  . Not on file  Social History Narrative  . Not on file   Family History  Problem Relation Age of Onset  . Breast cancer Mother 3  . Heart disease Mother   . Throat cancer Father   . Hypertension Father   . Arthritis Father   . Diabetes Father   . Arthritis Sister   . Obesity Sister   . Diabetes Sister   . Heart disease Cousin   . Colon cancer Neg Hx   . Stomach cancer Neg Hx   . Esophageal cancer Neg Hx  OBJECTIVE:  Vitals:   02/16/18 1507  BP: (!) 166/61  Pulse: 68  Resp: 16  Temp: 98.2 F (36.8 C)  TempSrc: Oral  SpO2: 98%   General appearance: AOx3 in no acute distress; sitting in wheelchair HEENT: NCAT.  Oropharynx clear.  Lungs: clear to auscultation bilaterally without adventitious breath sounds Heart: regular rate and rhythm.  Radial pulses 2+ symmetrical bilaterally Abdomen: soft protuberant; non-distended; no  tenderness; bowel sounds present;  no guarding or rebound tenderness Back: no CVA tenderness Extremities: no edema; symmetrical with no gross deformities Skin: warm and dry Psychological: alert and cooperative; normal mood and affect  Labs Reviewed  POCT URINALYSIS DIP (DEVICE) - Abnormal; Notable for the following components:      Result Value   Hgb urine dipstick TRACE (*)    Protein, ur 30 (*)    Nitrite POSITIVE (*)    Leukocytes, UA SMALL (*)    All other components within normal limits  URINE CULTURE  CERVICOVAGINAL ANCILLARY ONLY    ASSESSMENT & PLAN:  1. Acute cystitis with hematuria   2. Vaginal discharge     Meds ordered this encounter  Medications  . cephALEXin (KEFLEX) 500 MG capsule    Sig: Take 1 capsule (500 mg total) by mouth 2 (two) times daily for 7 days.    Dispense:  14 capsule    Refill:  0    Order Specific Question:   Supervising Provider    Answer:   Wynona Luna [[id number]]    Urine culture sent.  We will call you with the results.   Push fluids and get plenty of rest.   Take antibiotic as directed and to completion Vaginal swab obtained.  We will follow up with you regarding the results of your tests Follow up with PCP if symptoms persists Return here or go to ER if you have any new or worsening symptoms such as fever, abdominal pain, nausea/vomiting, flank pain, etc...  Outlined signs and symptoms indicating need for more acute intervention. Patient verbalized understanding. After Visit Summary given.     Lestine Box, PA-C 02/16/18 1549

## 2018-02-16 NOTE — ED Triage Notes (Signed)
Pt presents today with urine odor that started 3 months ago. States that she does not have any other sxs. No hematuria, no dysuria, no urinary frequency. Has had some lower back discomfort.

## 2018-02-16 NOTE — Discharge Instructions (Signed)
Urine culture sent.  We will call you with the results.   Push fluids and get plenty of rest.   Take antibiotic as directed and to completion Vaginal swab obtained.  We will follow up with you regarding the results of your tests Follow up with PCP if symptoms persists Return here or go to ER if you have any new or worsening symptoms such as fever, abdominal pain, nausea/vomiting, flank pain, etc..[name]

## 2018-02-17 LAB — URINE CULTURE

## 2018-02-18 LAB — CERVICOVAGINAL ANCILLARY ONLY
Bacterial vaginitis: NEGATIVE
Candida vaginitis: NEGATIVE
Chlamydia: NEGATIVE
Neisseria Gonorrhea: NEGATIVE
Trichomonas: NEGATIVE

## 2018-02-20 ENCOUNTER — Other Ambulatory Visit: Payer: Self-pay | Admitting: Internal Medicine

## 2018-03-06 ENCOUNTER — Other Ambulatory Visit: Payer: Self-pay | Admitting: Hematology

## 2018-03-06 ENCOUNTER — Other Ambulatory Visit: Payer: Self-pay | Admitting: Internal Medicine

## 2018-03-08 DIAGNOSIS — E669 Obesity, unspecified: Secondary | ICD-10-CM | POA: Diagnosis not present

## 2018-03-08 DIAGNOSIS — R0689 Other abnormalities of breathing: Secondary | ICD-10-CM | POA: Diagnosis not present

## 2018-03-08 DIAGNOSIS — G8929 Other chronic pain: Secondary | ICD-10-CM | POA: Diagnosis not present

## 2018-03-08 DIAGNOSIS — E875 Hyperkalemia: Secondary | ICD-10-CM | POA: Diagnosis not present

## 2018-03-08 DIAGNOSIS — M1990 Unspecified osteoarthritis, unspecified site: Secondary | ICD-10-CM | POA: Diagnosis not present

## 2018-03-08 DIAGNOSIS — I5040 Unspecified combined systolic (congestive) and diastolic (congestive) heart failure: Secondary | ICD-10-CM | POA: Diagnosis not present

## 2018-03-08 DIAGNOSIS — D649 Anemia, unspecified: Secondary | ICD-10-CM | POA: Diagnosis not present

## 2018-03-11 ENCOUNTER — Encounter: Payer: Self-pay | Admitting: Nurse Practitioner

## 2018-03-11 ENCOUNTER — Ambulatory Visit (INDEPENDENT_AMBULATORY_CARE_PROVIDER_SITE_OTHER): Payer: PPO | Admitting: Nurse Practitioner

## 2018-03-11 VITALS — BP 134/74 | HR 75 | Temp 98.7°F | Ht 67.0 in | Wt 321.0 lb

## 2018-03-11 DIAGNOSIS — G894 Chronic pain syndrome: Secondary | ICD-10-CM | POA: Diagnosis not present

## 2018-03-11 DIAGNOSIS — E1165 Type 2 diabetes mellitus with hyperglycemia: Secondary | ICD-10-CM

## 2018-03-11 DIAGNOSIS — E1140 Type 2 diabetes mellitus with diabetic neuropathy, unspecified: Secondary | ICD-10-CM

## 2018-03-11 DIAGNOSIS — N183 Chronic kidney disease, stage 3 unspecified: Secondary | ICD-10-CM

## 2018-03-11 DIAGNOSIS — Z23 Encounter for immunization: Secondary | ICD-10-CM

## 2018-03-11 DIAGNOSIS — D509 Iron deficiency anemia, unspecified: Secondary | ICD-10-CM

## 2018-03-11 DIAGNOSIS — I10 Essential (primary) hypertension: Secondary | ICD-10-CM | POA: Diagnosis not present

## 2018-03-11 DIAGNOSIS — R8290 Unspecified abnormal findings in urine: Secondary | ICD-10-CM

## 2018-03-11 DIAGNOSIS — Z794 Long term (current) use of insulin: Secondary | ICD-10-CM

## 2018-03-11 DIAGNOSIS — R413 Other amnesia: Secondary | ICD-10-CM

## 2018-03-11 DIAGNOSIS — I5032 Chronic diastolic (congestive) heart failure: Secondary | ICD-10-CM

## 2018-03-11 DIAGNOSIS — E1121 Type 2 diabetes mellitus with diabetic nephropathy: Secondary | ICD-10-CM

## 2018-03-11 DIAGNOSIS — IMO0002 Reserved for concepts with insufficient information to code with codable children: Secondary | ICD-10-CM

## 2018-03-11 NOTE — Patient Instructions (Addendum)
Increase toujeo to 40 units twice daily  INCREASE Novolog to 5 units PLUS sliding scale for EVENING MEAL.   Call and let us know what tablets you are taking in the evening  Follow up in 3 months for routine follow up

## 2018-03-11 NOTE — Progress Notes (Signed)
Careteam: Patient Care Team: Gildardo Cranker, DO as PCP - General (Internal Medicine) [name], [name], MD (Inactive) as Consulting Physician (Cardiology) [name], MD as Consulting Physician (Ophthalmology) [name], MD as Consulting Physician (General Surgery) [name], MD as Referring Physician (Obstetrics and Gynecology) Leona Singleton, RN as [place] Management  Advanced Directive information Does Patient Have a Medical Advance Directive?: No  Allergies  Allergen Reactions  . Sulfonamide Derivatives Swelling    Mouth swelling  . Tramadol Nausea And Vomiting  . Aricept [Donepezil Hcl]     GI upset/loose stools    Chief Complaint  Patient presents with  . Medical Management of Chronic Issues    Pt is being seen for a 3 month routine visit. pt has been having a strong odor while urinating for about 3 months. Pt was treated at [facility] for same symptoms  . Audit C Screening    Score of 0     HPI: Patient is a 83 y.o. female seen in the office today for routine follow up.   Pt with ongoing symptoms of strong odor with urination. Culture reported multiple species present. Went to GYN for odor but he did not find anything. Has been ongoing issue for 3-4 months.   Not taking any of her medication in the evening because something is giving her a headache. (unsure what she is taking in the evening) States she takes 12 pills in the morning and 3 in the evening.   DM- A1c elevated to 9.8 in June, Toujeo was increase to 40 units BID but she reports taking 38 BID. Reports blood sugars are "in range"  In the last 90 days her fasting blood sugar 138, blood sugars in the evening or in the 300s. Starting after dinner time blood sugars remain high. Snack a lot in the evening.   htn- hydralazine 50 mg by mouth twice daily (Not taking evening dose), metoprolol succinate daily   Anemia- not taking her iron   Edema- no swelling recently, not needing  lasix recently   OA- back, shoulders, neck and knees which requires Oxycodone.   Memory loss- taking namenda 10 mg in the morning was instructed to take 5 mg in the evening but has stopped this.   Review of Systems:  Review of Systems  Constitutional: Negative for chills, fever and malaise/fatigue.  Respiratory: Negative for hemoptysis and shortness of breath.   Cardiovascular: Negative for chest pain.  Gastrointestinal: Negative for blood in stool, constipation and diarrhea.  Genitourinary: Negative for dysuria, frequency, hematuria and urgency.       Foul odor with urination  Musculoskeletal: Positive for back pain, joint pain and neck pain.  Neurological: Positive for tingling and sensory change.  Endo/Heme/Allergies: Bruises/bleeds easily.  Psychiatric/Behavioral: Positive for memory loss.    Past Medical History:  Diagnosis Date  . Allergy    takes Mucinex daily as needed  . Anemia, unspecified   . Anxiety    takes Clonazepam daily as needed  . Arthritis   . CHF (congestive heart failure) (HCC)    takes Furosemide daily  . Coronary artery disease    a. s/p IMI 2004 tx with BMS to RCA;  b. s/p Promus DES to RCA 2/12 c. 06/2014 High risk stress test follow up cath 06/2014 showd patent stent--< med therapy for other mild to moedrate disease   . Coronary atherosclerosis of native coronary artery   . Depression    takes Cymbalta daily  .  Diabetes mellitus    insulin daily  . Dysuria   . GERD (gastroesophageal reflux disease)    takes Protonix daily  . Gout, unspecified    takes Colchicine daily  . Headache    occasionally  . History of blood clots about 64yrs ago   in legs-takes Coumadin   . Hyperlipidemia    takes Pravastatin daily  . Hypertension    takes Imdur and Metoprolol daily  . Insomnia   . Joint pain   . Joint swelling   . Myocardial infarction ([place]) 2004  . Numbness   . Obstructive sleep apnea   . Osteoarthritis   . Osteoarthritis   . Osteoarthrosis,  unspecified whether generalized or localized, lower leg   . Pain, chronic   . Polymyalgia rheumatica ([place])   . Pulmonary emboli ([place]) 9/13   felt to need lifelong anticoagulation  . Shortness of breath dyspnea    with exertion and has Albuterol inhaler prn  . Type II or unspecified type diabetes mellitus without mention of complication, uncontrolled   . Urinary incontinence    takes Linzess daily  . Vertigo    hx of;was taking Meclizine if needed   Past Surgical History:  Procedure Laterality Date  . ABDOMINAL HYSTERECTOMY     partial  . APPENDECTOMY    . blood clots/legs and lungs  2013  . BREAST BIOPSY Left 07/22/2014  . BREAST BIOPSY Left 02/10/2013  . BREAST LUMPECTOMY Left 11/05/2014  . BREAST LUMPECTOMY WITH RADIOACTIVE SEED LOCALIZATION Left 11/05/2014   Procedure: LEFT BREAST LUMPECTOMY WITH RADIOACTIVE SEED LOCALIZATION;  Surgeon: [name], MD;  Location: [place];  Service: General;  Laterality: Left;  . CARDIAC CATHETERIZATION    . COLONOSCOPY    . CORONARY ANGIOPLASTY  2  . ESOPHAGOGASTRODUODENOSCOPY (EGD) WITH PROPOFOL N/A 11/07/2016   Procedure: ESOPHAGOGASTRODUODENOSCOPY (EGD) WITH PROPOFOL;  Surgeon: Gatha Mayer, MD;  Location: [facility];  Service: Endoscopy;  Laterality: N/A;  . EXCISION OF SKIN TAG Right 11/05/2014   Procedure: EXCISION OF RIGHT EYELID SKIN TAG;  Surgeon: [name], MD;  Location: [place];  Service: General;  Laterality: Right;  . EYE SURGERY Bilateral    cataract   . GASTRIC BYPASS  1977    reversed in 1979, [place] N/A 06/29/2014   Procedure: LEFT HEART CATHETERIZATION WITH CORONARY ANGIOGRAM;  Surgeon: Troy Sine, MD;  Location: [facility] CATH LAB;  Service: Cardiovascular;  Laterality: N/A;  . MI with stent placement  2004   Social History:   reports that she has never smoked. She has never used smokeless tobacco. She reports that she does not drink alcohol or use  drugs.  Family History  Problem Relation Age of Onset  . Breast cancer Mother 14  . Heart disease Mother   . Throat cancer Father   . Hypertension Father   . Arthritis Father   . Diabetes Father   . Arthritis Sister   . Obesity Sister   . Diabetes Sister   . Heart disease Cousin   . Colon cancer Neg Hx   . Stomach cancer Neg Hx   . Esophageal cancer Neg Hx     Medications: Patient's Medications  New Prescriptions   No medications on file  Previous Medications   ALBUTEROL (PROVENTIL HFA;VENTOLIN HFA) 108 (90 BASE) MCG/ACT INHALER    Inhale 1-2 puffs into the lungs every 6 (six) hours as needed for wheezing or shortness of breath.  ANASTROZOLE (ARIMIDEX) 1 MG TABLET    TAKE 1 TABLET BY MOUTH EVERY DAY   BIOTIN 62831 MCG TABS    Take 1 tablet by mouth daily.   BUDESONIDE-FORMOTEROL (SYMBICORT) 160-4.5 MCG/ACT INHALER    Inhale 2 puffs into the lungs 2 (two) times daily. For bronchitis   CHOLECALCIFEROL (VITAMIN D3) 5000 UNITS CAPS    Take 1 capsule by mouth daily.   CLONAZEPAM (KLONOPIN) 1 MG TABLET    TAKE 2 TABLETS BY MOUTH AT BEDTIME AS NEEDED FOR ANXIETY   CONTINUOUS BLOOD GLUC RECEIVER (FREESTYLE LIBRE 14 DAY READER) DEVI    1 Device by Does not apply route 4 (four) times daily -  before meals and at bedtime. For diabetes   CONTINUOUS BLOOD GLUC SENSOR (FREESTYLE LIBRE 14 DAY SENSOR) MISC    1 Units by Does not apply route 4 (four) times daily -  before meals and at bedtime. For diabetes   DULOXETINE (CYMBALTA) 60 MG CAPSULE    Take one capsule by mouth once daily for anxiety   ESTRADIOL (ESTRACE) 0.1 MG/GM VAGINAL CREAM    Use every other evening use in vaginal area    Uses as needed   FERROUS GLUCONATE (FERGON) 324 MG TABLET    Take 1 tablet (324 mg total) by mouth daily with breakfast.   FUROSEMIDE (LASIX) 20 MG TABLET    TAKE 1 TABLET BY MOUTH EVERY DAY AS NEEDED   GLUCOSE BLOOD (ONETOUCH VERIO) TEST STRIP    USE 1 STRIP TO CHECK GLUCOSE THREE TIMES DAILY  E11.21    HYDRALAZINE (APRESOLINE) 50 MG TABLET    Take 50 mg by mouth 2 (two) times daily.   HYDROXYZINE (ATARAX/VISTARIL) 10 MG TABLET    Take 1 tablet (10 mg total) by mouth 3 (three) times daily as needed for itching.   INSULIN ASPART (NOVOLOG FLEXPEN) 100 UNIT/ML FLEXPEN    Before each meal 3 times a day, 140-199 - 2 units, 200-250 - 4 units, 251-299 - 6 units,  300-349 - 8 units,  350 or above 10 units. Insulin PEN if approved, provide syringes and needles if needed.   INSULIN GLARGINE (TOUJEO SOLOSTAR) 300 UNIT/ML SOPN    Inject 40 Units into the skin 2 (two) times daily. (before breakfast and before supper)   ISOSORBIDE MONONITRATE (IMDUR) 60 MG 24 HR TABLET    TAKE 1 TABLET BY MOUTH EVERY DAY   LANCET DEVICE MISC    Onetouch verio machine lancets, check blood sugar twice daily as directed DX E11.69   MAGNESIUM OXIDE (MAG-OX) 400 MG TABLET    Take 1 tablet (400 mg total) by mouth daily.   MECLIZINE (ANTIVERT) 25 MG TABLET    TAKE 1 TABLET BY MOUTH THREE TIMES DAILY AS NEEDED FOR DIZZINESS   MEMANTINE (NAMENDA) 5 MG TABLET    TAKE 2 TABLETS BY MOUTH EVERY MORNING AND 1 TABLET EVERY EVENING FOR MEMORY   METOPROLOL SUCCINATE (TOPROL-XL) 25 MG 24 HR TABLET    TAKE 1 TABLET BY MOUTH EVERY DAY FOR blood pressure   NITROGLYCERIN (NITROSTAT) 0.4 MG SL TABLET    Take one tablet under the tongue every 5 minutes as needed for chest pain   OXYCODONE-ACETAMINOPHEN (PERCOCET) 10-325 MG TABLET    Take 1 tablet by mouth every 6 (six) hours.   PANTOPRAZOLE (PROTONIX) 40 MG TABLET    TAKE 1 TABLET BY MOUTH DAILY   PRAVASTATIN (PRAVACHOL) 20 MG TABLET    TAKE 1 TABLET BY MOUTH EVERY DAY FOR  cholesterol   RIVAROXABAN (XARELTO) 10 MG TABS TABLET    Take 1 tablet (10 mg total) by mouth daily.   ULORIC 40 MG TABLET    Take 1 tablet (40 mg total) by mouth daily.   VITAMIN B-12 (CYANOCOBALAMIN) 1000 MCG TABLET    Take 1,000 mcg by mouth daily.  Modified Medications   No medications on file  Discontinued Medications    CRANBERRY EXTRACT PO    Take 1 tablet by mouth daily.   HYDRALAZINE (APRESOLINE) 50 MG TABLET    Take 1 tablet (50 mg total) by mouth 2 (two) times daily.     Physical Exam:  Vitals:   03/11/18 1454  BP: 134/74  Pulse: 75  Temp: 98.7 F (37.1 C)  TempSrc: Oral  SpO2: 96%  Weight: (!) 321 lb (145.6 kg)  Height: 5\' 7"  (1.702 m)   Body mass index is 50.28 kg/m.  Physical Exam  Constitutional: She is oriented to person, place, and time. She appears well-developed and well-nourished.  HENT:  Mouth/Throat: Oropharynx is clear and moist. No oropharyngeal exudate.  MMM; no oral thrush  Eyes: Pupils are equal, round, and reactive to light. No scleral icterus.  Neck: Neck supple. Carotid bruit is not present. No tracheal deviation present. No thyromegaly present.  Cardiovascular: Normal rate, regular rhythm and intact distal pulses. Exam reveals no gallop and no friction rub.  Murmur (1/6 SEM) heard. Trace LE edema b/l. No calf TTP  Pulmonary/Chest: Effort normal. No stridor. No respiratory distress. She has no wheezes. She has no rales.  Decreased lung sounds  Abdominal: Soft. Normal appearance and bowel sounds are normal. She exhibits no distension and no mass. There is no hepatomegaly. There is no tenderness. There is no rigidity, no rebound and no guarding. No hernia.  obese  Musculoskeletal: She exhibits edema and tenderness.  Lymphadenopathy:    She has no cervical adenopathy.  Neurological: She is alert and oriented to person, place, and time. She has normal reflexes.  Skin: Skin is warm and dry. No rash noted.  Psychiatric: She has a normal mood and affect. Her behavior is normal. Thought content normal.   Labs reviewed: Basic Metabolic Panel: Recent Labs    10/09/17 0049 10/09/17 0715 10/10/17 0448 10/23/17 1418 10/24/17 1227 11/27/17 1326  NA  --  139 134* 134* 133* 136  K  --  3.8 4.1 4.5 5.4* 5.5*  CL  --  99* 101 98 98 103  CO2  --  29 28 27 27 25   GLUCOSE   --  214* 273* 244* 211* 284*  BUN  --  22* 25* 35* 37* 32*  CREATININE  --  1.23* 1.47* 2.24* 1.86* 1.65*  CALCIUM  --  9.1 8.4* 9.0 8.8 9.7  MG  --  1.2* 1.5*  --  1.4*  --   PHOS  --  3.7  --   --   --   --   TSH 0.884  --   --   --   --   --    Liver Function Tests: Recent Labs    10/09/17 0715 10/23/17 1418 11/27/17 1326  AST 12* 13 10*  ALT 11* 7 9  ALKPHOS 76 85 89  BILITOT 0.5 0.4 0.3  PROT 8.0 7.8 8.4*  ALBUMIN 2.8* 2.8* 3.0*   No results for input(s): LIPASE, AMYLASE in the last 8760 hours. No results for input(s): AMMONIA in the last 8760 hours. CBC: Recent Labs    08/02/17 1346  10/10/17 0448 10/23/17 1418 11/27/17 1326  WBC 10.1   < > 12.1* 11.6* 11.8*  NEUTROABS 6,929  --   --  7.4* 8.7*  HGB 8.2*   < > 8.1* 8.5* 9.1*  HCT 25.5*   < > 27.3* 28.4* 30.3*  MCV 79.9*   < > 82.5 82.6 83.0  PLT 304   < > 324 317 303   < > = values in this interval not displayed.   Lipid Panel: Recent Labs    06/08/17 1015  CHOL 92  HDL 38*  LDLCALC 37  TRIG 89  CHOLHDL 2.4   TSH: Recent Labs    10/09/17 0049  TSH 0.884   A1C: Lab Results  Component Value Date   HGBA1C 9.8 (H) 10/09/2017     Assessment/Plan 1. Microcytic anemia -not currently  On iron supplement - CBC with Differential/Platelets  2. CKD (chronic kidney disease) stage 3, GFR 30-59 ml/min (HCC) Encourage proper hydration and to avoid NSAIDS (Aleve, Advil, Motrin, Ibuprofen)  - COMPLETE METABOLIC PANEL WITH GFR  3. Uncontrolled type 2 diabetes mellitus with diabetic nephropathy, with long-term current use of insulin ([place]) -remains uncontrolled, eating 2 "meals" a day and snacking in the evening. Goes to bed late and gets up late. To increase toujeo to 40 units BID and to increase novolog with evening meal to 5 units plus sliding scale.  - Hemoglobin A1c  4. Memory loss Continues on namenda however only taking once daily  5. Essential hypertension Stable on current regimen.  6. Need  for influenza vaccination - Flu vaccine HIGH DOSE PF (Fluzone High dose)  7. Bad odor of urine Recent urine negative for UTI, no pain with urination, also had GYN evaluation for this without abnormal. Finding.  8. Chronic pain syndrome Stable on oxycodone 10-325 mg.   9. Morbid (severe) obesity due to excess calories (HCC) Noted,  Exercise limited due to joint pain. Weight loss encouraged  10. Chronic diastolic congestive heart failure (HCC) Stable, euvolemic, uses lasix PRN  Next appt: 04/12/2018 [facility]. [place], [facility] [phone]

## 2018-03-12 ENCOUNTER — Other Ambulatory Visit: Payer: Self-pay

## 2018-03-12 DIAGNOSIS — R413 Other amnesia: Secondary | ICD-10-CM

## 2018-03-12 LAB — CBC WITH DIFFERENTIAL/PLATELET
Basophils Absolute: 12 cells/uL (ref 0–200)
Basophils Relative: 0.1 %
Eosinophils Absolute: 234 cells/uL (ref 15–500)
Eosinophils Relative: 2 %
HCT: 30.9 % — ABNORMAL LOW (ref 35.0–45.0)
Hemoglobin: 10.1 g/dL — ABNORMAL LOW (ref 11.7–15.5)
Lymphs Abs: 2633 cells/uL (ref 850–3900)
MCH: 28.9 pg (ref 27.0–33.0)
MCHC: 32.7 g/dL (ref 32.0–36.0)
MCV: 88.3 fL (ref 80.0–100.0)
MPV: 11.4 fL (ref 7.5–12.5)
Monocytes Relative: 5.2 %
Neutro Abs: 8213 cells/uL — ABNORMAL HIGH (ref 1500–7800)
Neutrophils Relative %: 70.2 %
Platelets: 290 10*3/uL (ref 140–400)
RBC: 3.5 10*6/uL — ABNORMAL LOW (ref 3.80–5.10)
RDW: 12.1 % (ref 11.0–15.0)
Total Lymphocyte: 22.5 %
WBC mixed population: 608 cells/uL (ref 200–950)
WBC: 11.7 10*3/uL — ABNORMAL HIGH (ref 3.8–10.8)

## 2018-03-12 LAB — COMPLETE METABOLIC PANEL WITH GFR
AG Ratio: 0.8 (calc) — ABNORMAL LOW (ref 1.0–2.5)
ALT: 8 U/L (ref 6–29)
AST: 11 U/L (ref 10–35)
Albumin: 3.2 g/dL — ABNORMAL LOW (ref 3.6–5.1)
Alkaline phosphatase (APISO): 81 U/L (ref 33–130)
BUN/Creatinine Ratio: 25 (calc) — ABNORMAL HIGH (ref 6–22)
BUN: 35 mg/dL — ABNORMAL HIGH (ref 7–25)
CO2: 28 mmol/L (ref 20–32)
Calcium: 9 mg/dL (ref 8.6–10.4)
Chloride: 104 mmol/L (ref 98–110)
Creat: 1.41 mg/dL — ABNORMAL HIGH (ref 0.60–0.88)
GFR, Est African American: 40 mL/min/{1.73_m2} — ABNORMAL LOW (ref >=60)
GFR, Est Non African American: 35 mL/min/{1.73_m2} — ABNORMAL LOW (ref >=60)
Globulin: 4.1 g/dL (calc) — ABNORMAL HIGH (ref 1.9–3.7)
Glucose, Bld: 246 mg/dL — ABNORMAL HIGH (ref 65–99)
Potassium: 5.1 mmol/L (ref 3.5–5.3)
Sodium: 136 mmol/L (ref 135–146)
Total Bilirubin: 0.2 mg/dL (ref 0.2–1.2)
Total Protein: 7.3 g/dL (ref 6.1–8.1)

## 2018-03-12 LAB — HEMOGLOBIN A1C
Hgb A1c MFr Bld: 10.2 % of total Hgb — ABNORMAL HIGH (ref <5.7)
Mean Plasma Glucose: 246 (calc)
eAG (mmol/L): 13.6 (calc)

## 2018-03-12 MED ORDER — INSULIN GLARGINE 300 UNIT/ML ~~LOC~~ SOPN: 40.0000 [IU] | PEN_INJECTOR | Freq: Two times a day (BID) | SUBCUTANEOUS | 3 refills | Status: DC

## 2018-03-12 MED ORDER — INSULIN ASPART 100 UNIT/ML FLEXPEN: PEN_INJECTOR | SUBCUTANEOUS | 0 refills | Status: DC

## 2018-03-13 ENCOUNTER — Ambulatory Visit: Payer: PPO | Admitting: Nurse Practitioner

## 2018-03-14 ENCOUNTER — Telehealth: Payer: Self-pay

## 2018-03-14 NOTE — Telephone Encounter (Signed)
[name] and patient called (call on speaker phone) to question if we had received any paperwork from patient assistance to have patient continue Toujeo for 2020. Patient is trying to avoid running out of medication  I informed patient that we have not received any correspondence and I will follow-up with [employer] (patient assistance).     I called [phone], spoke with [place]. Patient's enrollment does not end until 06/26/2018. Due to provider change from [place] to [name], NP patient will need to download new enrollment form and resubmit with Jessica's information.   Patient is also due for a refill, a refill form will be faxed to [facility] with 3 questions that need to be answered and sent back with enrollment form. It will take 2 days to process forms and 5-7 days to ship medication.   I will download form since there is no information the patient needs to complete

## 2018-03-14 NOTE — Telephone Encounter (Addendum)
Form was downloaded ([email or web address]) forwarded to [name], NP for completion   Then to be faxed back along with refill request from [employer] to send out next shipment of Toujeo

## 2018-03-21 ENCOUNTER — Other Ambulatory Visit: Payer: Self-pay | Admitting: *Deleted

## 2018-03-21 ENCOUNTER — Telehealth: Payer: Self-pay | Admitting: *Deleted

## 2018-03-21 DIAGNOSIS — R8290 Unspecified abnormal findings in urine: Secondary | ICD-10-CM

## 2018-03-21 DIAGNOSIS — N898 Other specified noninflammatory disorders of vagina: Secondary | ICD-10-CM

## 2018-03-21 DIAGNOSIS — G894 Chronic pain syndrome: Secondary | ICD-10-CM

## 2018-03-21 MED ORDER — OXYCODONE-ACETAMINOPHEN 10-325 MG PO TABS: 1.0000 | ORAL_TABLET | Freq: Four times a day (QID) | ORAL | 0 refills | Status: DC

## 2018-03-21 NOTE — Telephone Encounter (Signed)
It was my understanding she already was evaluated by the GYN for this problem. Can we find out who she went to for this and get their records for review.

## 2018-03-21 NOTE — Telephone Encounter (Signed)
Patient stated NO she has NOT been evaluated by a GYN for this problem.

## 2018-03-21 NOTE — Telephone Encounter (Signed)
Referral placed.

## 2018-03-21 NOTE — Telephone Encounter (Signed)
Patient requested refill [place] Verified LR: 02/15/2018 Pended Rx and sent to [facility] for Approval.

## 2018-03-21 NOTE — Telephone Encounter (Signed)
Patient called and stated that she still has a bad Fishy odor and wants to go see a GYN, stated that she saw you for this problem on 03/11/18. Stated that she called [facility]-Dr. Ena Dawley and they told her that she has to have a referral from PCP. Patient is requesting a referral to be placed to [facility] GYN-Dr. Ena Dawley for the [facility] odor. Please Advise and place referral.

## 2018-03-27 ENCOUNTER — Telehealth: Payer: Self-pay

## 2018-03-27 NOTE — Telephone Encounter (Signed)
6 boxes of Toujeo received from [employer] patient assistance. Patient aware samples available for pick-up

## 2018-04-03 ENCOUNTER — Other Ambulatory Visit: Payer: Self-pay | Admitting: *Deleted

## 2018-04-03 MED ORDER — MAGNESIUM OXIDE 400 MG PO TABS: 400.0000 mg | ORAL_TABLET | Freq: Every day | ORAL | 1 refills | Status: DC

## 2018-04-03 MED ORDER — FEBUXOSTAT 40 MG PO TABS: ORAL_TABLET | ORAL | 1 refills | Status: DC

## 2018-04-03 NOTE — Telephone Encounter (Signed)
[facility]

## 2018-04-07 DIAGNOSIS — E669 Obesity, unspecified: Secondary | ICD-10-CM | POA: Diagnosis not present

## 2018-04-07 DIAGNOSIS — M1990 Unspecified osteoarthritis, unspecified site: Secondary | ICD-10-CM | POA: Diagnosis not present

## 2018-04-07 DIAGNOSIS — R0689 Other abnormalities of breathing: Secondary | ICD-10-CM | POA: Diagnosis not present

## 2018-04-07 DIAGNOSIS — D649 Anemia, unspecified: Secondary | ICD-10-CM | POA: Diagnosis not present

## 2018-04-07 DIAGNOSIS — E875 Hyperkalemia: Secondary | ICD-10-CM | POA: Diagnosis not present

## 2018-04-07 DIAGNOSIS — I5040 Unspecified combined systolic (congestive) and diastolic (congestive) heart failure: Secondary | ICD-10-CM | POA: Diagnosis not present

## 2018-04-07 DIAGNOSIS — G8929 Other chronic pain: Secondary | ICD-10-CM | POA: Diagnosis not present

## 2018-04-09 ENCOUNTER — Telehealth: Payer: Self-pay | Admitting: Nurse Practitioner

## 2018-04-09 NOTE — Telephone Encounter (Signed)
I called the patient to ask if she could come in earlier on this Friday 04/12/18, but she wanted to keep the same appt time of 1:45pm. VDM (DD)

## 2018-04-12 ENCOUNTER — Ambulatory Visit: Payer: PPO

## 2018-04-12 VITALS — BP 140/62 | HR 94 | Temp 97.7°F | Ht 67.0 in | Wt 323.0 lb

## 2018-04-12 DIAGNOSIS — Z0000 Encounter for general adult medical examination without abnormal findings: Secondary | ICD-10-CM | POA: Diagnosis not present

## 2018-04-12 MED ORDER — ZOSTER VAC RECOMB ADJUVANTED 50 MCG/0.5ML IM SUSR: 0.5000 mL | Freq: Once | INTRAMUSCULAR | 1 refills | Status: AC

## 2018-04-12 NOTE — Progress Notes (Signed)
Subjective:   [name] is a 83 y.o. female who presents for Medicare Annual (Subsequent) preventive examination.  Last AWV-04/02/2017    Objective:     Vitals: BP 140/62 (BP Location: Left Arm, Patient Position: Sitting)   Pulse 94   Temp 97.7 F (36.5 C) (Oral)   Ht 5\' 7"  (1.702 m)   Wt (!) 323 lb (146.5 kg)   SpO2 97%   BMI 50.59 kg/m   Body mass index is 50.59 kg/m.  Advanced Directives 04/12/2018 03/11/2018 01/14/2018 10/09/2017 08/17/2017 08/01/2017 07/26/2017  Does Patient Have a Medical Advance Directive? Yes No Yes Yes No Yes No  Type of Paramedic of [place];Living will - [place];Living will Living will - - -  Does patient want to make changes to medical advance directive? No - Patient declined - No - Patient declined No - Patient declined - No - Patient declined -  Copy of [place] in Chart? No - copy requested - No - copy requested - - - -  Would patient like information on creating a medical advance directive? - - - - - - No - Patient declined  Pre-existing out of facility DNR order (yellow form or pink MOST form) - - - - - - -    Tobacco Social History   Tobacco Use  Smoking Status Never Smoker  Smokeless Tobacco Never Used     Counseling given: Not Answered   Clinical Intake:  Pre-visit preparation completed: No  Pain : No/denies pain     Diabetes: Yes CBG done?: No Did pt. bring in CBG monitor from home?: No  How often do you need to have someone help you when you read instructions, pamphlets, or other written materials from your doctor or pharmacy?: 1 - Never What is the last grade level you completed in school?: high school  Interpreter Needed?: No  Information entered by :: Tyson Dense, RN  Past Medical History:  Diagnosis Date  . Allergy    takes Mucinex daily as needed  . Anemia, unspecified   . Anxiety    takes Clonazepam daily as needed  . Arthritis   . CHF  (congestive heart failure) (HCC)    takes Furosemide daily  . Coronary artery disease    a. s/p IMI 2004 tx with BMS to RCA;  b. s/p Promus DES to RCA 2/12 c. 06/2014 High risk stress test follow up cath 06/2014 showd patent stent--< med therapy for other mild to moedrate disease   . Coronary atherosclerosis of native coronary artery   . Depression    takes Cymbalta daily  . Diabetes mellitus    insulin daily  . Dysuria   . GERD (gastroesophageal reflux disease)    takes Protonix daily  . Gout, unspecified    takes Colchicine daily  . Headache    occasionally  . History of blood clots about 33yrs ago   in legs-takes Coumadin   . Hyperlipidemia    takes Pravastatin daily  . Hypertension    takes Imdur and Metoprolol daily  . Insomnia   . Joint pain   . Joint swelling   . Myocardial infarction ([place]) 2004  . Numbness   . Obstructive sleep apnea   . Osteoarthritis   . Osteoarthritis   . Osteoarthrosis, unspecified whether generalized or localized, lower leg   . Pain, chronic   . Polymyalgia rheumatica ([place])   . Pulmonary emboli ([place]) 9/13   felt to  need lifelong anticoagulation  . Shortness of breath dyspnea    with exertion and has Albuterol inhaler prn  . Type II or unspecified type diabetes mellitus without mention of complication, uncontrolled   . Urinary incontinence    takes Linzess daily  . Vertigo    hx of;was taking Meclizine if needed   Past Surgical History:  Procedure Laterality Date  . ABDOMINAL HYSTERECTOMY     partial  . APPENDECTOMY    . blood clots/legs and lungs  2013  . BREAST BIOPSY Left 07/22/2014  . BREAST BIOPSY Left 02/10/2013  . BREAST LUMPECTOMY Left 11/05/2014  . BREAST LUMPECTOMY WITH RADIOACTIVE SEED LOCALIZATION Left 11/05/2014   Procedure: LEFT BREAST LUMPECTOMY WITH RADIOACTIVE SEED LOCALIZATION;  Surgeon: [name], MD;  Location: [facility];  Service: General;  Laterality: Left;  . CARDIAC CATHETERIZATION    . COLONOSCOPY    .  CORONARY ANGIOPLASTY  2  . ESOPHAGOGASTRODUODENOSCOPY (EGD) WITH PROPOFOL N/A 11/07/2016   Procedure: ESOPHAGOGASTRODUODENOSCOPY (EGD) WITH PROPOFOL;  Surgeon: Gatha Mayer, MD;  Location: [facility];  Service: Endoscopy;  Laterality: N/A;  . EXCISION OF SKIN TAG Right 11/05/2014   Procedure: EXCISION OF RIGHT EYELID SKIN TAG;  Surgeon: [name], MD;  Location: [facility];  Service: General;  Laterality: Right;  . EYE SURGERY Bilateral    cataract   . GASTRIC BYPASS  1977    reversed in 1979, [place] N/A 06/29/2014   Procedure: LEFT HEART CATHETERIZATION WITH CORONARY ANGIOGRAM;  Surgeon: Troy Sine, MD;  Location: [facility];  Service: Cardiovascular;  Laterality: N/A;  . MI with stent placement  2004   Family History  Problem Relation Age of Onset  . Breast cancer Mother 83  . Heart disease Mother   . Throat cancer Father   . Hypertension Father   . Arthritis Father   . Diabetes Father   . Arthritis Sister   . Obesity Sister   . Diabetes Sister   . Heart disease Cousin   . Colon cancer Neg Hx   . Stomach cancer Neg Hx   . Esophageal cancer Neg Hx    Social History   Socioeconomic History  . Marital status: Widowed    Spouse name: Not on file  . Number of children: Not on file  . Years of education: Not on file  . Highest education level: Not on file  Occupational History  . Occupation: retired  Scientific laboratory technician  . Financial resource strain: Not hard at all  . Food insecurity:    Worry: Never true    Inability: Never true  . Transportation needs:    Medical: Yes    Non-medical: Yes  Tobacco Use  . Smoking status: Never Smoker  . Smokeless tobacco: Never Used  Substance and Sexual Activity  . Alcohol use: No    Alcohol/week: 0.0 standard drinks  . Drug use: No  . Sexual activity: Not Currently  Lifestyle  . Physical activity:    Days per week: 0 days    Minutes per session: 0 min  . Stress:  Rather much  Relationships  . Social connections:    Talks on phone: Once a week    Gets together: Three times a week    Attends religious service: 1 to 4 times per year    Active member of club or organization: Yes    Attends meetings of clubs or organizations: 1 to 4 times per year  Relationship status: Widowed  Other Topics Concern  . Not on file  Social History Narrative  . Not on file    Outpatient Encounter Medications as of 04/12/2018  Medication Sig  . albuterol (PROVENTIL HFA;VENTOLIN HFA) 108 (90 Base) MCG/ACT inhaler Inhale 1-2 puffs into the lungs every 6 (six) hours as needed for wheezing or shortness of breath.  . anastrozole (ARIMIDEX) 1 MG tablet TAKE 1 TABLET BY MOUTH EVERY DAY  . Biotin 10000 MCG TABS Take 1 tablet by mouth daily.  . budesonide-formoterol (SYMBICORT) 160-4.5 MCG/ACT inhaler Inhale 2 puffs into the lungs 2 (two) times daily. For bronchitis  . Cholecalciferol (VITAMIN D3) 5000 units CAPS Take 1 capsule by mouth daily.  . clonazePAM (KLONOPIN) 1 MG tablet TAKE 2 TABLETS BY MOUTH AT BEDTIME AS NEEDED FOR ANXIETY  . Continuous Blood Gluc Receiver (FREESTYLE LIBRE 14 DAY READER) DEVI 1 Device by Does not apply route 4 (four) times daily -  before meals and at bedtime. For diabetes  . Continuous Blood Gluc Sensor (FREESTYLE LIBRE 14 DAY SENSOR) MISC 1 Units by Does not apply route 4 (four) times daily -  before meals and at bedtime. For diabetes  . DULoxetine (CYMBALTA) 60 MG capsule Take one capsule by mouth once daily for anxiety  . estradiol (ESTRACE) 0.1 MG/GM vaginal cream Use every other evening use in vaginal area    Uses as needed  . febuxostat (ULORIC) 40 MG tablet Take 1 tablet (40 mg total) by mouth daily.  . ferrous gluconate (FERGON) 324 MG tablet Take 1 tablet (324 mg total) by mouth daily with breakfast.  . furosemide (LASIX) 20 MG tablet TAKE 1 TABLET BY MOUTH EVERY DAY AS NEEDED  . glucose blood (ONETOUCH VERIO) test strip USE 1 STRIP TO  CHECK GLUCOSE THREE TIMES DAILY  E11.21  . hydrALAZINE (APRESOLINE) 50 MG tablet Take 50 mg by mouth 2 (two) times daily.  . hydrOXYzine (ATARAX/VISTARIL) 10 MG tablet Take 1 tablet (10 mg total) by mouth 3 (three) times daily as needed for itching.  . insulin aspart (NOVOLOG FLEXPEN) 100 UNIT/ML FlexPen Before each meal 3 times a day, 140-199 - 2 units, 200-250 - 4 units, 251-299 - 6 units,  300-349 - 8 units,  350 or above 10 units. Before supper to take 5 units of novolog PLUS above Sliding Scale Insulin PEN if approved, provide syringes and needles if needed.  . Insulin Glargine (TOUJEO SOLOSTAR) 300 UNIT/ML SOPN Inject 40 Units into the skin 2 (two) times daily. (before breakfast and before supper)  . isosorbide mononitrate (IMDUR) 60 MG 24 hr tablet TAKE 1 TABLET BY MOUTH EVERY DAY  . Lancet Device MISC Onetouch verio machine lancets, check blood sugar twice daily as directed DX E11.69  . magnesium oxide (MAG-OX) 400 MG tablet Take 1 tablet (400 mg total) by mouth daily.  . meclizine (ANTIVERT) 25 MG tablet TAKE 1 TABLET BY MOUTH THREE TIMES DAILY AS NEEDED FOR DIZZINESS  . memantine (NAMENDA) 5 MG tablet Take 2 tablets by mouth in the morning for memory  . metoprolol succinate (TOPROL-XL) 25 MG 24 hr tablet TAKE 1 TABLET BY MOUTH EVERY DAY FOR blood pressure  . nitroGLYCERIN (NITROSTAT) 0.4 MG SL tablet Take one tablet under the tongue every 5 minutes as needed for chest pain  . oxyCODONE-acetaminophen (PERCOCET) 10-325 MG tablet Take 1 tablet by mouth every 6 (six) hours.  . pantoprazole (PROTONIX) 40 MG tablet TAKE 1 TABLET BY MOUTH DAILY  . pravastatin (PRAVACHOL) 20  MG tablet TAKE 1 TABLET BY MOUTH EVERY DAY FOR cholesterol  . rivaroxaban (XARELTO) 10 MG TABS tablet Take 1 tablet (10 mg total) by mouth daily.  . vitamin B-12 (CYANOCOBALAMIN) 1000 MCG tablet Take 1,000 mcg by mouth daily.  [name] Zoster Vaccine Adjuvanted [facility]) injection Inject 0.5 mLs into the muscle once for 1 dose.    . [DISCONTINUED] Zoster Vaccine Adjuvanted [facility]) injection Inject 0.5 mLs into the muscle once.   No facility-administered encounter medications on file as of 04/12/2018.     Activities of Daily Living In your present state of health, do you have any difficulty performing the following activities: 04/12/2018 01/14/2018  Hearing? Y [place]? N N  Comment - wears glasses  Difficulty concentrating or making decisions? N Y  Walking or climbing stairs? Y Y  Dressing or bathing? Y Y  Doing errands, shopping? Y Y  Comment - family assist with errands  Preparing Food and eating ? Y Y  Using the Toilet? Y Y  In the past six months, have you accidently leaked urine? Y Y  Do you have problems with loss of bowel control? N N  Managing your Medications? Y Y  Managing your Finances? [name]  Housekeeping or managing your Housekeeping? [name]  Some recent data might be hidden    Patient Care Team: [name], NP as PCP - General (Geriatric Medicine) [name], [name], MD (Inactive) as Consulting Physician (Cardiology) [name], MD as Consulting Physician (Ophthalmology) [name], MD as Consulting Physician (General Surgery) [name], MD as Referring Physician (Obstetrics and Gynecology) Leona Singleton, RN as [place] Management    Assessment:   This is a routine wellness examination for Martrice.  Exercise Activities and Dietary recommendations Current Exercise Habits: The patient does not participate in regular exercise at present, Exercise limited by: orthopedic condition(s)  Goals    . DIET - EAT MORE FRUITS AND VEGETABLES     Patient will increase vegetables.    . Increase physical activity     Starting 03/24/16,  I will attempt to get out of bed daily for at least 30 min at a time.        Fall Risk Fall Risk  04/12/2018 03/11/2018 02/11/2018 01/14/2018 12/11/2017  Falls in the past year? 0 0 Yes Yes No  Number falls in past  yr: 0 - 2 or more 2 or more -  Comment - - no falls in the last month - -  Injury with Fall? 0 - No No -  Comment - - - - -  Risk Factor Category  - - High Fall Risk High Fall Risk -  Risk for fall due to : - - History of fall(s);Impaired vision;Medication side effect;Impaired balance/gait;Impaired mobility Impaired mobility;Impaired balance/gait;History of fall(s);Impaired vision;Medication side effect -  Follow up - - Education provided;Falls prevention discussed Falls prevention discussed;Education provided -   Is the patient's home free of loose throw rugs in walkways, pet beds, electrical cords, etc?   yes      Grab bars in the bathroom? yes      Handrails on the stairs?   yes      Adequate lighting?   yes  Depression Screen PHQ 2/9 Scores 04/12/2018 01/14/2018 10/25/2017 08/17/2017  PHQ - 2 Score 6 3 3  -  PHQ- 9 Score 17 9 7  -  Exception Documentation - - - Other- indicate reason in comment box  Not completed - - -  spoke with daughter, did not speak with patient     Cognitive Function MMSE - Mini Mental State Exam 04/12/2018 08/02/2016 08/02/2016 07/27/2016 03/24/2016  Orientation to time 4 4 4 4 5   Orientation to Place 5 5 5 5 5   Registration 3 3 3 3 3   Attention/ Calculation 5 5 5 5  0  Recall 1 1 1 1 2   Language- name 2 objects 2 2 2 2 2   Language- repeat 1 1 1 1 1   Language- follow 3 step command 3 2 2 1 3   Language- read & follow direction 1 1 1 1 1   Write a sentence 1 1 1 1 1   Copy design 0 1 1 1 1   Total score 26 26 26 25 24         Immunization History  Administered Date(s) Administered  . Influenza Whole 03/14/2007, 02/13/2008, 01/18/2010  . Influenza, High Dose Seasonal PF 01/16/2017, 03/11/2018  . Influenza,inj,Quad PF,6+ Mos 01/08/2013, 02/18/2014, 01/18/2015, 01/24/2016  . Influenza-Unspecified 02/15/2012, 02/18/2014  . Pneumococcal Conjugate-13 02/27/2017  . Pneumococcal Polysaccharide-23 05/08/2002  . Td 05/08/2008    Qualifies for Shingles Vaccine? Yes  ,educated and ordered to pharmacy  Screening Tests Health Maintenance  Topic Date Due  . OPHTHALMOLOGY EXAM  04/25/2017  . FOOT EXAM  12/12/2017  . TETANUS/TDAP  05/08/2018  . HEMOGLOBIN A1C  09/09/2018  . MAMMOGRAM  11/02/2018  . INFLUENZA VACCINE  Completed  . DEXA SCAN  Completed  . PNA vac Low Risk Adult  Completed    Cancer Screenings: Lung: Low Dose CT Chest recommended if Age 78-80 years, 30 pack-year currently smoking OR have quit w/in 15years. Patient does not qualify. Breast:  Up to date on Mammogram? Yes   Up to date of Bone Density/Dexa? Yes Colorectal: up to date  Additional Screenings:  Hepatitis C Screening: declined Annually eye exams with Dr. Dorita Sciara requested     Plan:    I have personally reviewed and addressed the Medicare Annual Wellness questionnaire and have noted the following in the patient's chart:  A. Medical and social history B. Use of alcohol, tobacco or illicit drugs  C. Current medications and supplements D. Functional ability and status E.  Nutritional status F.  Physical activity G. Advance directives H. List of other physicians I.  Hospitalizations, surgeries, and ER visits in previous 12 months J.  [place] to include hearing, vision, cognitive, depression L. Referrals and appointments - none  In addition, I have reviewed and discussed with patient certain preventive protocols, quality metrics, and best practice recommendations. A written personalized care plan for preventive services as well as general preventive health recommendations were provided to patient.  See attached scanned questionnaire for additional information.   Signed,   Tyson Dense, RN Nurse Health Advisor  Patient Concerns: Itching all over body. Left side is in achy pain all the time unless she takes pain medication. Bad odor with urine still.

## 2018-04-12 NOTE — Patient Instructions (Addendum)
[name] , Thank you for taking time to come for your Medicare Wellness Visit. I appreciate your ongoing commitment to your health goals. Please review the following plan we discussed and let me know if I can assist you in the future.   Screening recommendations/referrals: Colonoscopy excluded, over age 81 Mammogram excluded, over age 74 Bone Density up to date Recommended yearly ophthalmology/optometry visit for glaucoma screening and checkup Recommended yearly dental visit for hygiene and checkup  Vaccinations: Influenza vaccine up to date Pneumococcal vaccine up to date, completed Tdap vaccine up to date, due 05/08/2018 Shingles vaccine due, ordered to pharmacy    Advanced directives: Please bring [place] a copy of living will and health care power of attorney  Conditions/risks identified: none  Next appointment: [name], NP 06/11/2018 @ 2:45pm            Medicare Wellness Visit 04/14/2019 @ 2:00pm   Preventive Care 65 Years and Older, Female Preventive care refers to lifestyle choices and visits with your health care provider that can promote health and wellness. What does preventive care include?  A yearly physical exam. This is also called an annual well check.  Dental exams once or twice a year.  Routine eye exams. Ask your health care provider how often you should have your eyes checked.  Personal lifestyle choices, including:  Daily care of your teeth and gums.  Regular physical activity.  Eating a healthy diet.  Avoiding tobacco and drug use.  Limiting alcohol use.  Practicing safe sex.  Taking low-dose aspirin every day.  Taking vitamin and mineral supplements as recommended by your health care provider. What happens during an annual well check? The services and screenings done by your health care provider during your annual well check will depend on your age, overall health, lifestyle risk factors, and family history of disease. Counseling  Your health  care provider may ask you questions about your:  Alcohol use.  Tobacco use.  Drug use.  Emotional well-being.  Home and relationship well-being.  Sexual activity.  Eating habits.  History of falls.  Memory and ability to understand (cognition).  Work and work Statistician.  Reproductive health. Screening  You may have the following tests or measurements:  Height, weight, and BMI.  Blood pressure.  Lipid and cholesterol levels. These may be checked every 5 years, or more frequently if you are over 54 years old.  Skin check.  Lung cancer screening. You may have this screening every year starting at age 55 if you have a 30-pack-year history of smoking and currently smoke or have quit within the past 15 years.  Fecal occult blood test (FOBT) of the stool. You may have this test every year starting at age 50.  Flexible sigmoidoscopy or colonoscopy. You may have a sigmoidoscopy every 5 years or a colonoscopy every 10 years starting at age 73.  Hepatitis C blood test.  Hepatitis B blood test.  Sexually transmitted disease (STD) testing.  Diabetes screening. This is done by checking your blood sugar (glucose) after you have not eaten for a while (fasting). You may have this done every 1-3 years.  Bone density scan. This is done to screen for osteoporosis. You may have this done starting at age 14.  Mammogram. This may be done every 1-2 years. Talk to your health care provider about how often you should have regular mammograms. Talk with your health care provider about your test results, treatment options, and if necessary, the need for more tests.  Vaccines  Your health care provider may recommend certain vaccines, such as:  Influenza vaccine. This is recommended every year.  Tetanus, diphtheria, and acellular pertussis (Tdap, Td) vaccine. You may need a Td booster every 10 years.  Zoster vaccine. You may need this after age 1.  Pneumococcal 13-valent conjugate  (PCV13) vaccine. One dose is recommended after age 33.  Pneumococcal polysaccharide (PPSV23) vaccine. One dose is recommended after age 50. Talk to your health care provider about which screenings and vaccines you need and how often you need them. This information is not intended to replace advice given to you by your health care provider. Make sure you discuss any questions you have with your health care provider. Document Released: 05/21/2015 Document Revised: 01/12/2016 Document Reviewed: 02/23/2015 Elsevier Interactive Patient Education  2017 [employer] Prevention in the Home Falls can cause injuries. They can happen to people of all ages. There are many things you can do to make your home safe and to help prevent falls. What can I do on the outside of my home?  Regularly fix the edges of walkways and driveways and fix any cracks.  Remove anything that might make you trip as you walk through a door, such as a raised step or threshold.  Trim any bushes or trees on the path to your home.  Use bright outdoor lighting.  Clear any walking paths of anything that might make someone trip, such as rocks or tools.  Regularly check to see if handrails are loose or broken. Make sure that both sides of any steps have handrails.  Any raised decks and porches should have guardrails on the edges.  Have any leaves, snow, or ice cleared regularly.  Use sand or salt on walking paths during winter.  Clean up any spills in your garage right away. This includes oil or grease spills. What can I do in the bathroom?  Use night lights.  Install grab bars by the toilet and in the tub and shower. Do not use towel bars as grab bars.  Use non-skid mats or decals in the tub or shower.  If you need to sit down in the shower, use a plastic, non-slip stool.  Keep the floor dry. Clean up any water that spills on the floor as soon as it happens.  Remove soap buildup in the tub or shower  regularly.  Attach bath mats securely with double-sided non-slip rug tape.  Do not have throw rugs and other things on the floor that can make you trip. What can I do in the bedroom?  Use night lights.  Make sure that you have a light by your bed that is easy to reach.  Do not use any sheets or blankets that are too big for your bed. They should not hang down onto the floor.  Have a firm chair that has side arms. You can use this for support while you get dressed.  Do not have throw rugs and other things on the floor that can make you trip. What can I do in the kitchen?  Clean up any spills right away.  Avoid walking on wet floors.  Keep items that you use a lot in easy-to-reach places.  If you need to reach something above you, use a strong step stool that has a grab bar.  Keep electrical cords out of the way.  Do not use floor polish or wax that makes floors slippery. If you must use wax, use non-skid floor wax.  Do not have throw rugs and other things on the floor that can make you trip. What can I do with my stairs?  Do not leave any items on the stairs.  Make sure that there are handrails on both sides of the stairs and use them. Fix handrails that are broken or loose. Make sure that handrails are as long as the stairways.  Check any carpeting to make sure that it is firmly attached to the stairs. Fix any carpet that is loose or worn.  Avoid having throw rugs at the top or bottom of the stairs. If you do have throw rugs, attach them to the floor with carpet tape.  Make sure that you have a light switch at the top of the stairs and the bottom of the stairs. If you do not have them, ask someone to add them for you. What else can I do to help prevent falls?  Wear shoes that:  Do not have high heels.  Have rubber bottoms.  Are comfortable and fit you well.  Are closed at the toe. Do not wear sandals.  If you use a stepladder:  Make sure that it is fully  opened. Do not climb a closed stepladder.  Make sure that both sides of the stepladder are locked into place.  Ask someone to hold it for you, if possible.  Clearly mark and make sure that you can see:  Any grab bars or handrails.  First and last steps.  Where the edge of each step is.  Use tools that help you move around (mobility aids) if they are needed. These include:  Canes.  Walkers.  Scooters.  Crutches.  Turn on the lights when you go into a dark area. Replace any light bulbs as soon as they burn out.  Set up your furniture so you have a clear path. Avoid moving your furniture around.  If any of your floors are uneven, fix them.  If there are any pets around you, be aware of where they are.  Review your medicines with your doctor. Some medicines can make you feel dizzy. This can increase your chance of falling. Ask your doctor what other things that you can do to help prevent falls. This information is not intended to replace advice given to you by your health care provider. Make sure you discuss any questions you have with your health care provider. Document Released: 02/18/2009 Document Revised: 09/30/2015 Document Reviewed: 05/29/2014 Elsevier Interactive Patient Education  2017 [employer].

## 2018-04-16 ENCOUNTER — Other Ambulatory Visit: Payer: Self-pay | Admitting: *Deleted

## 2018-04-16 ENCOUNTER — Encounter: Payer: Self-pay | Admitting: *Deleted

## 2018-04-16 NOTE — Patient Outreach (Signed)
[name]) Care Management  [name]  04/17/2018   [name] 05/27/1934 [id number]   [name] [name]   Referral Date:  12/11/2017 Referral Source:  Transfer from [name] Reason for Referral:  Continued Disease Management Education/Chronic Care Improvement Program Insurance:  [employer]   [name] Attempt:  Successful telephone [name] to patient and daughter for quarterly follow up.  HIPAA verified with patient.  Patient stating she is doing fairly well.  Reporting recent increase in lower abdominal pain, frequent urinary tract infections and increase in foul smelling urine.  Had Urgent Care visit in October and was treated for urinary tract infection.  Treated for another urinary tract infection in November by primary care provider.  Continues to have foul smelling urine and is awaiting to schedule appointment with gynecologist.  Continues to wear Continuous glucose monitor and reports fasting blood sugars ranging 70-100.  Fasting blood sugar this morning was 77.  States her blood sugars are elevated thereafter breakfast to 200-300's.  Latest Hgb A1C is 10.2 on 03/11/2018.  Discussed current level with patient and daughter.  Patient and daughter both report medication compliance with increase in dose of insulins and evening medications.  Reports still having trouble weighing herself daily at home due to needing assistance getting on the scale.  Denies any increase in shortness of breath and reports using her rescue inhaler about once daily.  Patient reporting constant itching not relieved by atarax or creams. Also, reporting leg pains since Thanksgiving.  Encouraged patient to contact provider to discuss itching and pain.  Encounter Medications:  Outpatient Encounter Medications as of 04/16/2018  Medication Sig Note  . albuterol (PROVENTIL HFA;VENTOLIN HFA) 108 (90 Base) MCG/ACT inhaler Inhale 1-2 puffs into the lungs  every 6 (six) hours as needed for wheezing or shortness of breath.   . anastrozole (ARIMIDEX) 1 MG tablet TAKE 1 TABLET BY MOUTH EVERY DAY   . Biotin 10000 MCG TABS Take 1 tablet by mouth daily.   . budesonide-formoterol (SYMBICORT) 160-4.5 MCG/ACT inhaler Inhale 2 puffs into the lungs 2 (two) times daily. For bronchitis   . Cholecalciferol (VITAMIN D3) 5000 units CAPS Take 1 capsule by mouth daily.   . clonazePAM (KLONOPIN) 1 MG tablet TAKE 2 TABLETS BY MOUTH AT BEDTIME AS NEEDED FOR ANXIETY   . Continuous Blood Gluc Receiver (FREESTYLE LIBRE 14 DAY READER) DEVI 1 Device by Does not apply route 4 (four) times daily -  before meals and at bedtime. For diabetes   . Continuous Blood Gluc Sensor (FREESTYLE LIBRE 14 DAY SENSOR) MISC 1 Units by Does not apply route 4 (four) times daily -  before meals and at bedtime. For diabetes   . DULoxetine (CYMBALTA) 60 MG capsule Take one capsule by mouth once daily for anxiety   . estradiol (ESTRACE) 0.1 MG/GM vaginal cream Use every other evening use in vaginal area    Uses as needed   . febuxostat (ULORIC) 40 MG tablet Take 1 tablet (40 mg total) by mouth daily.   . ferrous gluconate (FERGON) 324 MG tablet Take 1 tablet (324 mg total) by mouth daily with breakfast.   . furosemide (LASIX) 20 MG tablet TAKE 1 TABLET BY MOUTH EVERY DAY AS NEEDED   . glucose blood (ONETOUCH VERIO) test strip USE 1 STRIP TO CHECK GLUCOSE THREE TIMES DAILY  E11.21   . hydrALAZINE (APRESOLINE) 50 MG tablet Take 50 mg by mouth 2 (two) times daily.   [name]  hydrOXYzine (ATARAX/VISTARIL) 10 MG tablet Take 1 tablet (10 mg total) by mouth 3 (three) times daily as needed for itching.   . insulin aspart (NOVOLOG FLEXPEN) 100 UNIT/ML FlexPen Before each meal 3 times a day, 140-199 - 2 units, 200-250 - 4 units, 251-299 - 6 units,  300-349 - 8 units,  350 or above 10 units. Before supper to take 5 units of novolog PLUS above Sliding Scale Insulin PEN if approved, provide syringes and needles if  needed.   . Insulin Glargine (TOUJEO SOLOSTAR) 300 UNIT/ML SOPN Inject 40 Units into the skin 2 (two) times daily. (before breakfast and before supper)   . isosorbide mononitrate (IMDUR) 60 MG 24 hr tablet TAKE 1 TABLET BY MOUTH EVERY DAY   . Lancet Device MISC Onetouch verio machine lancets, check blood sugar twice daily as directed DX E11.69   . magnesium oxide (MAG-OX) 400 MG tablet Take 1 tablet (400 mg total) by mouth daily.   . meclizine (ANTIVERT) 25 MG tablet TAKE 1 TABLET BY MOUTH THREE TIMES DAILY AS NEEDED FOR DIZZINESS   . memantine (NAMENDA) 5 MG tablet Take 2 tablets by mouth in the morning for memory   . metoprolol succinate (TOPROL-XL) 25 MG 24 hr tablet TAKE 1 TABLET BY MOUTH EVERY DAY FOR blood pressure   . nitroGLYCERIN (NITROSTAT) 0.4 MG SL tablet Take one tablet under the tongue every 5 minutes as needed for chest pain   . oxyCODONE-acetaminophen (PERCOCET) 10-325 MG tablet Take 1 tablet by mouth every 6 (six) hours.   . pantoprazole (PROTONIX) 40 MG tablet TAKE 1 TABLET BY MOUTH DAILY   . pravastatin (PRAVACHOL) 20 MG tablet TAKE 1 TABLET BY MOUTH EVERY DAY FOR cholesterol   . rivaroxaban (XARELTO) 10 MG TABS tablet Take 1 tablet (10 mg total) by mouth daily.   . vitamin B-12 (CYANOCOBALAMIN) 1000 MCG tablet Take 1,000 mcg by mouth daily. 01/14/2018: Takes PRN   No facility-administered encounter medications on file as of 04/16/2018.     Functional Status:  In your present state of health, do you have any difficulty performing the following activities: 04/12/2018 01/14/2018  Hearing? Y [place]? N N  Comment - wears glasses  Difficulty concentrating or making decisions? N Y  Walking or climbing stairs? Y Y  Dressing or bathing? Y Y  Doing errands, shopping? Y Y  Comment - family assist with errands  Preparing Food and eating ? Y Y  Using the Toilet? Y Y  In the past six months, have you accidently leaked urine? Y Y  Do you have problems with loss of  bowel control? N N  Managing your Medications? Y Y  Managing your Finances? [name]  Housekeeping or managing your Housekeeping? Y Y  Some recent data might be hidden    Fall/Depression Screening: Fall Risk  04/12/2018 03/11/2018 02/11/2018  Falls in the past year? 0 0 Yes  Number falls in past yr: 0 - 2 or more  Comment - - no falls in the last month  Injury with Fall? 0 - No  Comment - - -  Risk Factor Category  - - High Fall Risk  Risk for fall due to : - - History of fall(s);Impaired vision;Medication side effect;Impaired balance/gait;Impaired mobility  Follow up - - Education provided;Falls prevention discussed   PHQ 2/9 Scores 04/12/2018 01/14/2018 10/25/2017 08/17/2017 06/25/2017 06/19/2017 06/08/2017  PHQ - 2 Score 6 3 3  - 0 0 0  PHQ- 9 Score  17 9 7  - - - -  Exception Documentation - - - Other- indicate reason in comment box - - -  Not completed - - - spoke with daughter, did not speak with patient - - -    THN CM Care Plan Problem One     Most Recent Value  Care Plan Problem One  Knowledge deficiet related to self care management of Diabetes and heart failure.  Role Documenting the Problem One  [place] for Problem One  Active  [facility] Long Term Goal   Patient and daughter will report no emergency room visits or hospital admission in the next 90 days.  [facility] Long Term Goal Start Date  04/16/18  Interventions for Problem One Long Term Goal  Discussed and reviewed current care plan and goals with patient and daughter, encouraged to keep and attend medical appointments, encouraged to schedule appointment with gynecologist, encouraged patient and daughter to discuss constant itching without relief and leg pain with provider, encouraged daughter to contact DME agency to discuss possible changing of mattress for home hospital bed to a thicker mattress, reviewed medications and encouraged medication compliance  THN CM Short Term Goal #1   Patient will verbalize decrease Hgb A1C by 1 point  in the next 90 days. (10.2)  THN CM Short Term Goal #1 Start Date  04/16/18  Interventions for Short Term Goal #1  Discussed current increase in Hgb A1C with patient and daughter, discussed increased blood sugars throughout the day with patient and lower blood sugars in the mornings, encouraged patient and daughter to write down blood sugars and doses of insulin taken at times of blood sugars and take log to appointments for provider review, encouraged patient and daughter to discuss with provider lower blood sugars every morning (70-100's) and still elevated sugars in the evenings after insulins have been adjusted, discussed possible reasons of increase afternoon blood sugars, sending EMMI on Diabetes Diet, Sending EMMI on Diabetes and infection, discussed with patient and daughter increase risk of yeast infections and urinary tract infections with higher blood sugars and elevated A1C  THN CM Short Term Goal #2   Patient and daughter will report weighing at least weekly in the next 60 days.  THN CM Short Term Goal #2 Start Date  04/16/18  Interventions for Short Term Goal #2  Discussed daily weighing with daughter and patient and the importance of weighing daily to prevent heart failure exacerbation, discussed bariers to daily weighing, ecouraged patient to seek assistance standing on scale with walker to prevent falls, reviewed signs and symptoms of heart failure     Appointments:  Patient attended appointment with primary care provider on 03/11/2018 and has scheduled follow up on 06/11/2018 with [name] NP. Awaiting appointment with gynecologist.  Plan: [place] will send patient EMMI Diabetes and Infection. RN Health Coach will send patient EMMI Diabetes Diet RN Health Coach will send primary care provider Quarterly Update. RN health Coach will make next telephone [name] to patient within the month of March.  [place] [phone] [name]

## 2018-04-22 ENCOUNTER — Other Ambulatory Visit: Payer: Self-pay | Admitting: *Deleted

## 2018-04-22 DIAGNOSIS — G894 Chronic pain syndrome: Secondary | ICD-10-CM

## 2018-04-22 MED ORDER — OXYCODONE-ACETAMINOPHEN 10-325 MG PO TABS: 1.0000 | ORAL_TABLET | Freq: Four times a day (QID) | ORAL | 0 refills | Status: DC

## 2018-04-22 NOTE — Telephone Encounter (Signed)
Patient requested refill [place] Verified LR: 03/21/2018 Pended Rx and sent to [facility] for approval.

## 2018-04-30 ENCOUNTER — Other Ambulatory Visit: Payer: Self-pay | Admitting: Internal Medicine

## 2018-04-30 ENCOUNTER — Other Ambulatory Visit: Payer: Self-pay | Admitting: *Deleted

## 2018-05-02 ENCOUNTER — Other Ambulatory Visit: Payer: Self-pay | Admitting: *Deleted

## 2018-05-02 MED ORDER — HYDRALAZINE HCL 50 MG PO TABS: 50.0000 mg | ORAL_TABLET | Freq: Two times a day (BID) | ORAL | 0 refills | Status: DC

## 2018-05-08 DIAGNOSIS — E875 Hyperkalemia: Secondary | ICD-10-CM | POA: Diagnosis not present

## 2018-05-08 DIAGNOSIS — R0689 Other abnormalities of breathing: Secondary | ICD-10-CM | POA: Diagnosis not present

## 2018-05-08 DIAGNOSIS — G8929 Other chronic pain: Secondary | ICD-10-CM | POA: Diagnosis not present

## 2018-05-08 DIAGNOSIS — M1990 Unspecified osteoarthritis, unspecified site: Secondary | ICD-10-CM | POA: Diagnosis not present

## 2018-05-08 DIAGNOSIS — D649 Anemia, unspecified: Secondary | ICD-10-CM | POA: Diagnosis not present

## 2018-05-08 DIAGNOSIS — E669 Obesity, unspecified: Secondary | ICD-10-CM | POA: Diagnosis not present

## 2018-05-08 DIAGNOSIS — I5040 Unspecified combined systolic (congestive) and diastolic (congestive) heart failure: Secondary | ICD-10-CM | POA: Diagnosis not present

## 2018-05-10 DIAGNOSIS — M79674 Pain in right toe(s): Secondary | ICD-10-CM | POA: Diagnosis not present

## 2018-05-10 DIAGNOSIS — M2041 Other hammer toe(s) (acquired), right foot: Secondary | ICD-10-CM | POA: Diagnosis not present

## 2018-05-10 DIAGNOSIS — L602 Onychogryphosis: Secondary | ICD-10-CM | POA: Diagnosis not present

## 2018-05-10 DIAGNOSIS — E1351 Other specified diabetes mellitus with diabetic peripheral angiopathy without gangrene: Secondary | ICD-10-CM | POA: Diagnosis not present

## 2018-05-21 ENCOUNTER — Other Ambulatory Visit: Payer: Self-pay

## 2018-05-21 DIAGNOSIS — G894 Chronic pain syndrome: Secondary | ICD-10-CM

## 2018-05-21 MED ORDER — OXYCODONE-ACETAMINOPHEN 10-325 MG PO TABS: 1.0000 | ORAL_TABLET | Freq: Four times a day (QID) | ORAL | 0 refills | Status: DC

## 2018-05-21 NOTE — Telephone Encounter (Signed)
Patient called requesting refill for oxycodone 10-325 mg tab.  [place] data base verified last filled 04/22/18 total of 120 given  Next appointment 06/11/2018

## 2018-05-27 NOTE — Progress Notes (Signed)
.    Hematology oncology Clinic Followup Note  Date of service 05/28/18   Patient Care Team: Nichole Chandler, NP as PCP - General (Geriatric Medicine) [name], Nichole Conception, MD (Inactive) as Consulting Physician (Cardiology) Nichole Jacks, MD as Consulting Physician (Ophthalmology) Nichole Keens, MD as Consulting Physician (General Surgery) Nichole Height, MD (Inactive) as Referring Physician (Obstetrics and Gynecology) Nichole Singleton, RN as [facility] [place] Management  CHIEF COMPLAINTS/PURPOSE OF CONSULTATION:  F/u for breast cancer.  DIAGNOSIS: left-sided presumed stage IA  (pT1c,pNx[cN0], cM0) invasive ductal carcinoma grade 2 out of 3, strongly ER +100%, strongly PR +100% and HER-2/neu negative. Given her high risk cardiac status lumpectomy had to be done under local anesthesia and sentinel lymph node biopsy was not possible. She has accompanying DCIS of intermediate grade present at margins. ECOG performance status is 3. -Had a mammogram/tomosynthesis done on 07/29/2015 which shows residual suspicious calcification in the left breast postero-medial to the biopsy site. Has known residual DCIS.  No evidence of new malignancy.  No evidence of right breast malignancy.   CURRENT TREATMENT: Arimidex 54m po daily  HISTORY OF PRESENTING ILLNESS: -please see my initial consultation for details of initial presentation  INTERVAL HISTORY   Mrs. CSherrowis here for management and evaluation of her breast cancer. [name] last visit with [facility] on 11/27/17. She is accompanied today by several members of her family. The pt reports that she is doing well overall.   The pt reports that she has not had any difficulty taking Arimidex nor Vitamin D. She notes that she "doesn't walk at all," noting that her knees feel weak when she walks short distances. The pt notes that she had PT last year, but did not call them back at some point, and notes that she hasn't seen her PCP since then.     The pt denies noticing any new lumps or bumps in the breast and denies any changes or symptoms in either breast.   The pt notes that her ice picca cravings have resolved and she has continued on PO iron replacement.  The pt notes that her hands and feet are numb, and continues on 666mCymbalta. The pt's daughter notes that the pt had difficulty tolerating Gabapentin.  The pt notes that her urine has been very odorous but denies increased frequency, pain, or discomfort with urination. She notes that the bad smell of her urine began 6 months ago.   Lab results today (05/28/18) of CBC w/diff is as follows: all values are WNL except for RBC at 3.25, HGB at 9.4, HCT at 31.1. 05/28/18 CMP is pending  05/28/18 Ferritin is 225 05/28/18 CA 27.29 is <3.5  On review of systems, pt reports stable energy levels, numbness in hands and feet, moving her bowels, and denies ice cravings, urinary frequency, painful urination, breast symptoms, new lumps or bumps, breast discomfort, abdominal pains, and any other symptoms.   MEDICAL HISTORY:  Past Medical History:  Diagnosis Date  . Allergy    takes Mucinex daily as needed  . Anemia, unspecified   . Anxiety    takes Clonazepam daily as needed  . Arthritis   . CHF (congestive heart failure) (HCC)    takes Furosemide daily  . Coronary artery disease    a. s/p IMI 2004 tx with BMS to RCA;  b. s/p Promus DES to RCA 2/12 c. 06/2014 High risk stress test follow up cath 06/2014 showd patent stent--< med therapy for other mild to  moedrate disease   . Coronary atherosclerosis of native coronary artery   . Depression    takes Cymbalta daily  . Diabetes mellitus    insulin daily  . Dysuria   . GERD (gastroesophageal reflux disease)    takes Protonix daily  . Gout, unspecified    takes Colchicine daily  . Headache    occasionally  . History of blood clots about 4yr ago   in legs-takes Coumadin   . Hyperlipidemia    takes Pravastatin daily  . Hypertension     takes Imdur and Metoprolol daily  . Insomnia   . Joint pain   . Joint swelling   . [place]ocardial [place]  . Numbness   . Obstructive sleep apnea   . Osteoarthritis   . Osteoarthritis   . Osteoarthrosis, unspecified whether generalized or localized, lower leg   . Pain, chronic   . Polymyalgia rheumatica ([place]   . Pulmonary emboli ([place] 9/13   felt to need lifelong anticoagulation  . Shortness of breath dyspnea    with exertion and has Albuterol inhaler prn  . Type II or unspecified type diabetes mellitus without mention of complication, uncontrolled   . Urinary incontinence    takes Linzess daily  . Vertigo    hx of;was taking Meclizine if needed    SURGICAL HISTORY: Past Surgical History:  Procedure Laterality Date  . ABDOMINAL HYSTERECTOMY     partial  . APPENDECTOMY    . blood clots/legs and lungs  2013  . BREAST BIOPSY Left 07/22/2014  . BREAST BIOPSY Left 02/10/2013  . BREAST LUMPECTOMY Left 11/05/2014  . BREAST LUMPECTOMY WITH RADIOACTIVE SEED LOCALIZATION Left 11/05/2014   Procedure: LEFT BREAST LUMPECTOMY WITH RADIOACTIVE SEED LOCALIZATION;  Surgeon: DCoralie Keens MD;  [place] [facility]  Service: General;  Laterality: Left;  . CARDIAC CATHETERIZATION    . COLONOSCOPY    . CORONARY ANGIOPLASTY  2  . ESOPHAGOGASTRODUODENOSCOPY (EGD) WITH PROPOFOL N/A 11/07/2016   Procedure: ESOPHAGOGASTRODUODENOSCOPY (EGD) WITH PROPOFOL;  Surgeon: GGatha Mayer MD;  [facility];  Service: Endoscopy;  Laterality: N/A;  . EXCISION OF SKIN TAG Right 11/05/2014   Procedure: EXCISION OF RIGHT EYELID SKIN TAG;  Surgeon: DCoralie Keens MD;  [facility]  Service: General;  Laterality: Right;  . EYE SURGERY Bilateral    cataract   . GASTRIC BYPASS  1977    [place] 06[place]   Procedure: LEFT HEART CATHETERIZATION WITH CORONARY ANGIOGRAM;  Surgeon: TTroy Sine MD;   [facility];  Service: Cardiovascular;  Laterality: N/A;  . MI with stent placement  2004    SOCIAL HISTORY: Social History   Socioeconomic History  . Marital status: Widowed    Spouse name: Not on file  . Number of children: Not on file  . Years of education: Not on file  . Highest education level: Not on file  Occupational History  . Occupation: retired  SScientific laboratory technician . Financial resource strain: Not hard at all  . Food insecurity:    Worry: Never true    Inability: Never true  . Transportation needs:    Medical: Yes    Non-medical: Yes  Tobacco Use  . Smoking status: Never Smoker  . Smokeless tobacco: Never Used  Substance and Sexual Activity  . Alcohol use: No    Alcohol/week: 0.0 standard drinks  . Drug use: No  . Sexual activity: Not  Currently  Lifestyle  . Physical activity:    Days per week: 0 days    Minutes per session: 0 min  . Stress: Rather much  Relationships  . Social connections:    Talks on phone: Once a week    Gets together: Three times a week    Attends religious service: 1 to 4 times per year    Active member of club or organization: Yes    Attends meetings of clubs or organizations: 1 to 4 times per year    Relationship status: Widowed  . Intimate partner violence:    Fear of current or ex partner: No    Emotionally abused: No    Physically abused: No    Forced sexual activity: No  Other Topics Concern  . Not on file  Social History Narrative  . Not on file    FAMILY HISTORY: Family History  Problem Relation Age of Onset  . Breast cancer Mother 2  . Heart disease Mother   . Throat cancer Father   . Hypertension Father   . Arthritis Father   . Diabetes Father   . Arthritis Sister   . Obesity Sister   . Diabetes Sister   . Heart disease Cousin   . Colon cancer Neg Hx   . Stomach cancer Neg Hx   . Esophageal cancer Neg Hx     ALLERGIES:  is allergic to sulfonamide derivatives; tramadol; and aricept [donepezil  hcl].  MEDICATIONS:  Current Outpatient Medications  Medication Sig Dispense Refill  . albuterol (PROVENTIL HFA;VENTOLIN HFA) 108 (90 Base) MCG/ACT inhaler Inhale 1-2 puffs into the lungs every 6 (six) hours as needed for wheezing or shortness of breath. 1 Inhaler 6  . anastrozole (ARIMIDEX) 1 MG tablet TAKE 1 TABLET BY MOUTH EVERY DAY 90 tablet 1  . budesonide-formoterol (SYMBICORT) 160-4.5 MCG/ACT inhaler Inhale 2 puffs into the lungs 2 (two) times daily. For bronchitis 1 Inhaler 6  . Cholecalciferol (VITAMIN D3) 5000 units CAPS Take 1 capsule by mouth daily.    . clonazePAM (KLONOPIN) 1 MG tablet TAKE 2 TABLETS BY MOUTH AT BEDTIME AS NEEDED FOR ANXIETY 60 tablet 0  . Continuous Blood Gluc Receiver (FREESTYLE LIBRE 14 DAY READER) DEVI 1 Device by Does not apply route 4 (four) times daily -  before meals and at bedtime. For diabetes 1 Device 12  . Continuous Blood Gluc Sensor (FREESTYLE LIBRE 14 DAY SENSOR) MISC 1 Units by Does not apply route 4 (four) times daily -  before meals and at bedtime. For diabetes 2 each 12  . DULoxetine (CYMBALTA) 60 MG capsule Take one capsule by mouth once daily for anxiety 90 capsule 1  . estradiol (ESTRACE) 0.1 MG/GM vaginal cream Use every other evening use in vaginal area    Uses as needed    . febuxostat (ULORIC) 40 MG tablet Take 1 tablet (40 mg total) by mouth daily. 90 tablet 1  . ferrous gluconate (FERGON) 324 MG tablet Take 1 tablet (324 mg total) by mouth daily with breakfast.  3  . furosemide (LASIX) 20 MG tablet TAKE 1 TABLET BY MOUTH EVERY DAY AS NEEDED 30 tablet 6  . hydrALAZINE (APRESOLINE) 50 MG tablet Take 1 tablet (50 mg total) by mouth 2 (two) times daily. 60 tablet 0  . hydrOXYzine (ATARAX/VISTARIL) 10 MG tablet Take 1 tablet (10 mg total) by mouth 3 (three) times daily as needed for itching. 60 tablet 11  . Insulin Glargine (TOUJEO SOLOSTAR) 300 UNIT/ML SOPN Inject  40 Units into the skin 2 (two) times daily. (before breakfast and before  supper) 12 pen 3  . isosorbide mononitrate (IMDUR) 60 MG 24 hr tablet TAKE 1 TABLET BY MOUTH EVERY DAY 90 tablet 1  . Lancet Device MISC Onetouch verio machine lancets, check blood sugar twice daily as directed DX E11.69 100 each 3  . magnesium oxide (MAG-OX) 400 MG tablet Take 1 tablet (400 mg total) by mouth daily. 90 tablet 1  . meclizine (ANTIVERT) 25 MG tablet TAKE 1 TABLET BY MOUTH THREE TIMES DAILY AS NEEDED FOR DIZZINESS 60 tablet 0  . memantine (NAMENDA) 5 MG tablet Take 2 tablets by mouth in the morning for memory 60 tablet 0  . metoprolol succinate (TOPROL-XL) 25 MG 24 hr tablet TAKE 1 TABLET BY MOUTH EVERY DAY FOR blood pressure 90 tablet 1  . nitroGLYCERIN (NITROSTAT) 0.4 MG SL tablet Take one tablet under the tongue every 5 minutes as needed for chest pain 50 tablet 0  . NOVOLOG FLEXPEN 100 UNIT/ML FlexPen inject 3x DAILY BEFORE MEALS;IF 140-199, give 2 U. IF 200-250, 4 U. IF 251-299, 6 U. IF 300-349, 8 U. IF >350, 10 U. INJ 5 U NOVOLOG+SLIDING 15 mL 0  . oxyCODONE-acetaminophen (PERCOCET) 10-325 MG tablet Take 1 tablet by mouth every 6 (six) hours. 120 tablet 0  . pantoprazole (PROTONIX) 40 MG tablet TAKE 1 TABLET BY MOUTH DAILY 90 tablet 3  . pravastatin (PRAVACHOL) 20 MG tablet TAKE 1 TABLET BY MOUTH EVERY DAY FOR cholesterol 90 tablet 1  . rivaroxaban (XARELTO) 10 MG TABS tablet Take 1 tablet (10 mg total) by mouth daily. 30 tablet 11  . vitamin B-12 (CYANOCOBALAMIN) 1000 MCG tablet Take 1,000 mcg by mouth daily.    . Biotin 10000 MCG TABS Take 1 tablet by mouth daily.    [name] glucose blood (ONETOUCH VERIO) test strip USE 1 STRIP TO CHECK GLUCOSE THREE TIMES DAILY  E11.21 (Patient not taking: Reported on 05/28/2018) 300 each 12   No current facility-administered medications for this visit.     REVIEW OF SYSTEMS:   A 10+ POINT REVIEW OF SYSTEMS WAS OBTAINED including neurology, dermatology, psychiatry, cardiac, respiratory, lymph, extremities, GI, GU, Musculoskeletal,  constitutional, breasts, reproductive, HEENT.  All pertinent positives are noted in the HPI.  All others are negative.   PHYSICAL EXAMINATION:  ECOG PERFORMANCE STATUS: 3 - Symptomatic, >50% confined to bed  Vitals:   05/28/18 1344  BP: (!) 171/68  Pulse: 76  Resp: 18  Temp: 98 F (36.7 C)  SpO2: 98%   Filed Weights   05/28/18 1344  Weight: (!) 325 lb 1.6 oz (147.5 kg)    GENERAL:alert, in no acute distress and comfortable SKIN: no acute rashes, no significant lesions EYES: conjunctiva are pink and non-injected, sclera anicteric OROPHARYNX: MMM, no exudates, no oropharyngeal erythema or ulceration NECK: supple, no JVD LYMPH:  no palpable lymphadenopathy in the cervical, axillary or inguinal regions LUNGS: clear to auscultation b/l with normal respiratory effort HEART: regular rate & rhythm ABDOMEN:  normoactive bowel sounds , non tender, not distended. No palpable hepatosplenomegaly.  BREAST: no palpable masses, no palpable regional lymphadenopathy (performed exam with scribe and family member present with pt consent) Extremity: 1+ pedal edema bilaterally  PSYCH: alert & oriented x 3 with fluent speech NEURO: no focal motor/sensory deficits   LABORATORY DATA:  I have reviewed the data as listed  . CBC Latest Ref Rng & Units 05/28/2018 03/11/2018 11/27/2017  WBC 4.0 - 10.5 K/uL 10.2  11.7(H) 11.8(H)  Hemoglobin 12.0 - 15.0 g/dL 9.4(L) 10.1(L) 9.1(L)  Hematocrit 36.0 - 46.0 % 31.1(L) 30.9(L) 30.3(L)  Platelets 150 - 400 K/uL 270 290 303   . CBC    Component Value Date/Time   WBC 10.2 05/28/2018 1324   RBC 3.25 (L) 05/28/2018 1324   HGB 9.4 (L) 05/28/2018 1324   HGB 8.5 (L) 10/23/2017 1418   HGB 8.7 (L) 03/21/2017 1351   HCT 31.1 (L) 05/28/2018 1324   HCT 28.9 (L) 03/21/2017 1351   PLT 270 05/28/2018 1324   PLT 317 10/23/2017 1418   PLT 295 03/21/2017 1351   MCV 95.7 05/28/2018 1324   MCV 84.3 03/21/2017 1351   MCH 28.9 05/28/2018 1324   MCHC 30.2 05/28/2018 1324    RDW 13.2 05/28/2018 1324   RDW 15.5 (H) 03/21/2017 1351   LYMPHSABS 2.3 05/28/2018 1324   LYMPHSABS 2.4 03/21/2017 1351   MONOABS 0.6 05/28/2018 1324   MONOABS 0.7 03/21/2017 1351   EOSABS 0.2 05/28/2018 1324   EOSABS 0.2 03/21/2017 1351   BASOSABS 0.0 05/28/2018 1324   BASOSABS 0.0 03/21/2017 1351   . CMP Latest Ref Rng & Units 05/28/2018 03/11/2018 11/27/2017  Glucose 70 - 99 mg/dL 301(H) 246(H) 284(H)  BUN 8 - 23 mg/dL 34(H) 35(H) 32(H)  Creatinine 0.44 - 1.00 mg/dL 1.62(H) 1.41(H) 1.65(H)  Sodium 135 - 145 mmol/L 137 136 136  Potassium 3.5 - 5.1 mmol/L 5.5(H) 5.1 5.5(H)  Chloride 98 - 111 mmol/L 102 104 103  CO2 22 - 32 mmol/L '26 28 25  ' Calcium 8.9 - 10.3 mg/dL 9.3 9.0 9.7  Total Protein 6.5 - 8.1 g/dL 7.9 7.3 8.4(H)  Total Bilirubin 0.3 - 1.2 mg/dL 0.4 0.2 0.3  Alkaline Phos 38 - 126 U/L 80 - 89  AST 15 - 41 U/L 10(L) 11 10(L)  ALT 0 - 44 U/L '7 8 9        ' RADIOGRAPHIC STUDIES: I have personally reviewed the radiological images as listed and agreed with the findings in the report.  No results found. Diagnostic mammogram b/l 11/01/17  IMPRESSION: No significant change in suspicious microcalcifications posterior to the lumpectomy site in the left breast compared to the mammogram of 2018. No suspicious findings in the right breast. Exclusion of some posterior tissue due to patient decreased mobility.  RECOMMENDATION: Diagnostic mammogram is suggested in 1 year. (Code:[id number]-B-01Y)    CLINICAL DATA:  Annual mammography. The patient had surgery for DCIS in the left breast in 2016. There were positive margins at surgery but the patient could not return for additional surgery. Known suspicious calcifications remain. EXAM: 2D DIGITAL DIAGNOSTIC BILATERAL MAMMOGRAM WITH CAD AND ADJUNCT TOMO COMPARISON:  Previous exam(s). ACR Breast Density Category b: There are scattered areas of fibroglandular density. FINDINGS: The suspicious calcifications posterior to the left  lumpectomy site are stable. No other interval changes or other suspicious findings. Mammographic images were processed with CAD. IMPRESSION: Continued suspicious calcifications posterior to the left lumpectomy site. No other changes. RECOMMENDATION: Continued annual mammography for surveillance. Continued oncologic follow up. I have discussed the findings and recommendations with the patient. Results were also provided in writing at the conclusion of the visit. If applicable, a reminder letter will be sent to the patient regarding the next appointment. BI-RADS CATEGORY  6: Known biopsy-proven malignancy.   Electronically Signed   By: Dorise Bullion III M.D   On: 08/16/2016 15:21   ASSESSMENT & PLAN:   [name] is a very wonderful 84 y.o. African-American  female with multiple medical comorbidities as described above with   #1 Left-sided presumed stage IA  (pT1c,pNx[cN0], cM0) invasive ductal carcinoma grade 2 out of 3, strongly ER +100%, strongly PR +100% and HER-2/neu negative. Given her high risk cardiac status lumpectomy had to be done under local anesthesia and sentinel lymph node biopsy was not possible. She has accompanying DCIS of intermediate grade present at margins. ECOG performance status is 3. Dexa scan "low bone mass" per WHO. -Bone density scan on 03/12/2017 with results showing: T-score of 0.5 at left forearm. Lumbar spine and dual femurs not completed due to patient in a wheelchair and not able to lay flat due to SOB while laying flat.   11/01/17 Mammogram stable.   #2 significant coronary artery disease with stress test in February 2016 suggesting likely multivessel disease. Has uncontrolled diabetes, hypertension, dyslipidemia, untreated sleep apnea, coronary disease, severe arthritis all of which are significantly limiting her quality of life.  #3 vitamin D deficiency - 25OH vitamin D level of 10, s/p high dose ergocalciferol re-placement with 25OH Vit D  improvement to 30.9 in 10/2017. Now on vit D 2000 units daily.   #4 Worsening Anemia ? Slow GI losses . Multiple metabolic insults in the bone marrow including uncontrolled diabetes .Patient notes that her fasting glucose and so and in the 300s   #5 Hyperkalemia K 5.5 -- in setting of CKD and ? RTA type IV from DM2  PLAN: -Discussed pt labwork today, 05/28/18; blood counts are stable -05/28/18 Ferritin is adequate at >100 -05/28/18 CA27.29 is wnl -Mammogram in June 2020 -ordered -The pt has no prohibitive toxicities from continuing 27m Arimidex PO daily at this time.   -Recommend Clotrimazole powder OTC for intermitent yeast infection under armpits  -The pt shows no overt clinical or lab progression of her breast cancer at this time.   -Last Bone density study from 03/12/17 did not show signs of bone loss at her wrist -Continue Vitamin D 2000 IU daily  -Continue at least 1200-15020mpo calcium intake daily - discussed diet -She was recommended to follow-up with her primary care physician to optimize her diabetes management .  -rpt labs and f/u with PCP for mx of hyperkalemia (labs forwards to PCP as well). Low potassium diet. -Continue PO Iron Polysaccharide  15042mo daily -- ferritin goal would to >100 in the setting of CK -Will see the pt back in 24 weeks    -Mammogram in 22 weeks -RTC with Dr KalIrene Limboth labs in 24 weeks   Continue follow-up with your primary care physician Dr. CarEulas Postr other ongoing cares.  The total time spent in the appt was 25 minutes and more than 50% was on counseling and direct patient cares.   GauSullivan Lone MS Hematology/Oncology Physician [facility]En[facility] Reading [facility] HospitalOffice):       [phone] cell):  [phone]):           [phone], SchBaldwin Jamaicam acting as a scribe for Dr. GauSullivan Lone .I have reviewed the above documentation for accuracy and completeness, and I agree with the above. .GaBrunetta Genera

## 2018-05-28 ENCOUNTER — Telehealth: Payer: Self-pay | Admitting: *Deleted

## 2018-05-28 ENCOUNTER — Inpatient Hospital Stay: Payer: PPO

## 2018-05-28 ENCOUNTER — Telehealth: Payer: Self-pay | Admitting: Hematology

## 2018-05-28 ENCOUNTER — Inpatient Hospital Stay: Payer: PPO | Attending: Hematology | Admitting: Hematology

## 2018-05-28 VITALS — BP 171/68 | HR 76 | Temp 98.0°F | Resp 18 | Ht 67.0 in | Wt 325.1 lb

## 2018-05-28 DIAGNOSIS — G4730 Sleep apnea, unspecified: Secondary | ICD-10-CM | POA: Diagnosis not present

## 2018-05-28 DIAGNOSIS — Z170 Estrogen receptor positive status [ER+]: Secondary | ICD-10-CM | POA: Diagnosis not present

## 2018-05-28 DIAGNOSIS — Z79899 Other long term (current) drug therapy: Secondary | ICD-10-CM | POA: Insufficient documentation

## 2018-05-28 DIAGNOSIS — C50112 Malignant neoplasm of central portion of left female breast: Secondary | ICD-10-CM

## 2018-05-28 DIAGNOSIS — E785 Hyperlipidemia, unspecified: Secondary | ICD-10-CM

## 2018-05-28 DIAGNOSIS — E875 Hyperkalemia: Secondary | ICD-10-CM | POA: Diagnosis not present

## 2018-05-28 DIAGNOSIS — E119 Type 2 diabetes mellitus without complications: Secondary | ICD-10-CM | POA: Diagnosis not present

## 2018-05-28 DIAGNOSIS — Z79811 Long term (current) use of aromatase inhibitors: Secondary | ICD-10-CM | POA: Insufficient documentation

## 2018-05-28 DIAGNOSIS — I10 Essential (primary) hypertension: Secondary | ICD-10-CM

## 2018-05-28 DIAGNOSIS — E559 Vitamin D deficiency, unspecified: Secondary | ICD-10-CM | POA: Insufficient documentation

## 2018-05-28 DIAGNOSIS — D649 Anemia, unspecified: Secondary | ICD-10-CM | POA: Insufficient documentation

## 2018-05-28 LAB — CBC WITH DIFFERENTIAL/PLATELET
Abs Immature Granulocytes: 0.04 10*3/uL (ref 0.00–0.07)
Basophils Absolute: 0 10*3/uL (ref 0.0–0.1)
Basophils Relative: 0 %
Eosinophils Absolute: 0.2 10*3/uL (ref 0.0–0.5)
Eosinophils Relative: 2 %
HCT: 31.1 % — ABNORMAL LOW (ref 36.0–46.0)
Hemoglobin: 9.4 g/dL — ABNORMAL LOW (ref 12.0–15.0)
Immature Granulocytes: 0 %
Lymphocytes Relative: 22 %
Lymphs Abs: 2.3 10*3/uL (ref 0.7–4.0)
MCH: 28.9 pg (ref 26.0–34.0)
MCHC: 30.2 g/dL (ref 30.0–36.0)
MCV: 95.7 fL (ref 80.0–100.0)
Monocytes Absolute: 0.6 10*3/uL (ref 0.1–1.0)
Monocytes Relative: 6 %
Neutro Abs: 7.1 10*3/uL (ref 1.7–7.7)
Neutrophils Relative %: 70 %
Platelets: 270 10*3/uL (ref 150–400)
RBC: 3.25 MIL/uL — ABNORMAL LOW (ref 3.87–5.11)
RDW: 13.2 % (ref 11.5–15.5)
WBC: 10.2 10*3/uL (ref 4.0–10.5)
nRBC: 0 % (ref 0.0–0.2)

## 2018-05-28 LAB — CMP (CANCER CENTER ONLY)
ALT: 7 U/L (ref 0–44)
AST: 10 U/L — ABNORMAL LOW (ref 15–41)
Albumin: 3 g/dL — ABNORMAL LOW (ref 3.5–5.0)
Alkaline Phosphatase: 80 U/L (ref 38–126)
Anion gap: 9 (ref 5–15)
BUN: 34 mg/dL — ABNORMAL HIGH (ref 8–23)
CO2: 26 mmol/L (ref 22–32)
Calcium: 9.3 mg/dL (ref 8.9–10.3)
Chloride: 102 mmol/L (ref 98–111)
Creatinine: 1.62 mg/dL — ABNORMAL HIGH (ref 0.44–1.00)
GFR, Est AFR Am: 34 mL/min — ABNORMAL LOW (ref >60)
GFR, Estimated: 29 mL/min — ABNORMAL LOW (ref >60)
Glucose, Bld: 301 mg/dL — ABNORMAL HIGH (ref 70–99)
Potassium: 5.5 mmol/L — ABNORMAL HIGH (ref 3.5–5.1)
Sodium: 137 mmol/L (ref 135–145)
Total Bilirubin: 0.4 mg/dL (ref 0.3–1.2)
Total Protein: 7.9 g/dL (ref 6.5–8.1)

## 2018-05-28 LAB — FERRITIN: Ferritin: 225 ng/mL (ref 11–307)

## 2018-05-28 NOTE — Patient Instructions (Signed)
Thank you for choosing [facility] to provide your oncology and hematology care.  To afford each patient quality time with our providers, please arrive 30 minutes before your scheduled appointment time.  If you arrive late for your appointment, you may be asked to reschedule.  We strive to give you quality time with our providers, and arriving late affects you and other patients whose appointments are after yours.    If you are a no show for multiple scheduled visits, you may be dismissed from the clinic at the providers discretion.     Again, thank you for choosing [facility], our hope is that these requests will decrease the amount of time that you wait before being seen by our physicians.  ______________________________________________________________________   Should you have questions after your visit to the [facility], please contact our office at [phone] between the hours of 8:30 and 4:30 p.m.    Voicemails left after 4:30p.m will not be returned until the following business day.     For prescription refill requests, please have your pharmacy contact us directly.  Please also try to allow 48 hours for prescription requests.     Please contact the scheduling department for questions regarding scheduling.  For scheduling of procedures such as PET scans, CT scans, MRI, Ultrasound, etc please contact central scheduling at [phone].     Resources For Cancer Patients and Caregivers:    Oncolink.org:  A wonderful resource for patients and healthcare providers for information regarding your disease, ways to tract your treatment, what to expect, etc.      American Cancer Society:  800-227-2345  Can help patients locate various types of support and financial assistance   Cancer Care: 1-800-813-HOPE (4673) Provides financial assistance, online support groups, medication/co-pay assistance.     [place] DSS:  336-641-3447 Where to apply  for food stamps, Medicaid, and utility assistance   Medicare Rights Center: 800-333-4114 Helps people with Medicare understand their rights and benefits, navigate the Medicare system, and secure the quality healthcare they deserve   SCAT: 336-333-6589 [place] [employer]'s shared-ride transportation service for eligible riders who have a disability that prevents them from riding the fixed route bus.     For additional information on assistance programs please contact our social worker:   [name]:  [phone]  

## 2018-05-28 NOTE — Telephone Encounter (Signed)
Gave patient avs report and appointments for July. Patient[facility].

## 2018-05-28 NOTE — Telephone Encounter (Signed)
Patient called, left voice mail to ask if her appt was for today at [place] patient to let her know that lab appt is at 1pm and she sees Dr. Irene Limbo at 1:40.

## 2018-05-29 ENCOUNTER — Telehealth: Payer: Self-pay

## 2018-05-29 ENCOUNTER — Encounter: Payer: Self-pay | Admitting: Nutrition

## 2018-05-29 LAB — CANCER ANTIGEN 27.29: CA 27.29: <3.5 U/mL (ref 0.0–38.6)

## 2018-05-29 NOTE — Telephone Encounter (Signed)
Patient called and stated she would like CNA to come out and help her with bathing. She would also like to know if she needed to come to office and get document so that she could receive CNA assistance. Please advise. She stated she would come in the office if need be.

## 2018-05-29 NOTE — Telephone Encounter (Signed)
We can document this however this is most of the time out of pocket, I was fwd this to Lattie Haw to see if she can help her out with resources.

## 2018-05-29 NOTE — Progress Notes (Signed)
Per RN request, mailed patient a copy of low potassium foods.

## 2018-05-30 ENCOUNTER — Telehealth: Payer: Self-pay | Admitting: *Deleted

## 2018-05-30 NOTE — Telephone Encounter (Signed)
I see that the pt has an appt with [name] 06/03/2018, so I would suggest [facility] assistance come out to help with bathing when she comes to her appt. At that time [name] can submit a referral in Epic for me to send to [facility] of her choice.  There we will have th F2F encounter completed as well.  Thanks,  Kathyrn Lass

## 2018-05-30 NOTE — Telephone Encounter (Signed)
Per Dr. Irene Limbo directions: Let patient know her blood sugars are  Over 300 and her potassium is elevated at 5.5. Recommend low potassium diet (avoid high potassium foods) and f/u with PCP in 1 week for repeat labs and optimizing diabetes control and potassium level monitoring.  Contacted Ernestene Kiel, Nutritionist w/[facility] - she will mail a list of Low potassium foods to patient. Contacted patient and gave information as directed by Dr. Irene Limbo. Patient stated she has appt with PCP on Monday and will follow up as advised. Also told patient that office is sending a list of low potasium foods. Patient verbalized understanding. Encouraged to contact office for questions or concerns.

## 2018-06-03 ENCOUNTER — Other Ambulatory Visit: Payer: Self-pay | Admitting: *Deleted

## 2018-06-03 ENCOUNTER — Other Ambulatory Visit: Payer: Self-pay | Admitting: Nurse Practitioner

## 2018-06-03 ENCOUNTER — Ambulatory Visit (INDEPENDENT_AMBULATORY_CARE_PROVIDER_SITE_OTHER): Payer: PPO | Admitting: Nurse Practitioner

## 2018-06-03 ENCOUNTER — Encounter: Payer: Self-pay | Admitting: Nurse Practitioner

## 2018-06-03 VITALS — BP 144/82 | HR 78 | Temp 97.6°F | Ht 67.0 in | Wt 324.0 lb

## 2018-06-03 DIAGNOSIS — R413 Other amnesia: Secondary | ICD-10-CM

## 2018-06-03 DIAGNOSIS — Z794 Long term (current) use of insulin: Secondary | ICD-10-CM

## 2018-06-03 DIAGNOSIS — E875 Hyperkalemia: Secondary | ICD-10-CM

## 2018-06-03 DIAGNOSIS — E782 Mixed hyperlipidemia: Secondary | ICD-10-CM

## 2018-06-03 DIAGNOSIS — E1121 Type 2 diabetes mellitus with diabetic nephropathy: Secondary | ICD-10-CM | POA: Diagnosis not present

## 2018-06-03 DIAGNOSIS — R5381 Other malaise: Secondary | ICD-10-CM | POA: Diagnosis not present

## 2018-06-03 DIAGNOSIS — Z9189 Other specified personal risk factors, not elsewhere classified: Secondary | ICD-10-CM

## 2018-06-03 DIAGNOSIS — IMO0002 Reserved for concepts with insufficient information to code with codable children: Secondary | ICD-10-CM

## 2018-06-03 DIAGNOSIS — E1165 Type 2 diabetes mellitus with hyperglycemia: Secondary | ICD-10-CM | POA: Diagnosis not present

## 2018-06-03 DIAGNOSIS — E119 Type 2 diabetes mellitus without complications: Secondary | ICD-10-CM | POA: Diagnosis not present

## 2018-06-03 DIAGNOSIS — H40033 Anatomical narrow angle, bilateral: Secondary | ICD-10-CM | POA: Diagnosis not present

## 2018-06-03 MED ORDER — MEMANTINE HCL 5 MG PO TABS: ORAL_TABLET | ORAL | 3 refills | Status: DC

## 2018-06-03 MED ORDER — INSULIN ASPART 100 UNIT/ML FLEXPEN: PEN_INJECTOR | SUBCUTANEOUS | 3 refills | Status: DC

## 2018-06-03 MED ORDER — TETANUS-DIPHTH-ACELL PERTUSSIS 5-2.5-18.5 LF-MCG/0.5 IM SUSP: 0.5000 mL | Freq: Once | INTRAMUSCULAR | 0 refills | Status: AC

## 2018-06-03 NOTE — Patient Instructions (Addendum)
Follow up in 6 weeks on diabetes  Will get fasting lab work prior to visit.   Increase NOVOLOG to 8 units with Sliding Scale at mealtimes

## 2018-06-03 NOTE — Telephone Encounter (Signed)
[facility] requested refill  Pended Rx and sent to [facility] for Approval due to [employer].

## 2018-06-03 NOTE — Progress Notes (Signed)
Careteam: Patient Care Team: [name], NP as PCP - General (Geriatric Medicine) [name], [name], MD (Inactive) as Consulting Physician (Cardiology) [name], MD as Consulting Physician (Ophthalmology) [name], MD as Consulting Physician (General Surgery) [name], MD (Inactive) as Referring Physician (Obstetrics and Gynecology) Leona Singleton, RN as [facility] [place] Management  Advanced Directive information Does Patient Have a Medical Advance Directive?: No, Does patient want to make changes to medical advance directive?: Yes (MAU/Ambulatory/Procedural Areas - Information given)  Allergies  Allergen Reactions  . Sulfonamide Derivatives Swelling    Mouth swelling  . Tramadol Nausea And Vomiting  . Aricept [Donepezil Hcl]     GI upset/loose stools    Chief Complaint  Patient presents with  . Medical Management of Chronic Issues    discuss potassium level, and A1c.eye exam due, due for tdap vaccine     HPI: Patient is a 84 y.o. female seen in the office today for routine follow up.   DM- A1c 10.2 in November. Taking toujeo 40 units twice daily. Reports she has had some below 50  avg blood sugar in the 160s over the last few months according to her freestyle app.  11 'low" blood sugars in the last 30 days, mostly between 6-9am. Blood sugars are elevated from 12 noon to midnight most days  Reports she has cut back on snacking in the evening.  At last visit increased her novolog in the evening.  novolog 5 units plus SS. Eats her first meal at 12 noon and then 5-8 is her last meal.  Doing 5 units plus SSI to both meals.  Taking novolog and toujeo without missing doses.  Stopped soda and drinking a lot of juices.  Blood sugars still elevated in the evening.   Potassium elevated on recent labs- was eating more oranges, not eating bananas, no salt substitutes  HTN-hydralazine 50 mg BID and metoprolol succinate daily, imdur 60 mg daily  (no chest pains).    Hyperlipidemia- taking pravachol 20 mg in the evening. LDL at goal on last labs.   Headaches- have resolved- stopped namenda and then restarted and have not had any.   Chronic pain in knees, feets, hands, low back- taking oxycodone-apap. Takes all that is prescribed. No side effects. Takes more than directed most days.  Having a lot of neuropathy   Review of Systems:  Review of Systems  Constitutional: Negative for chills, fever and malaise/fatigue.  Respiratory: Negative for hemoptysis and shortness of breath.   Cardiovascular: Negative for chest pain.  Gastrointestinal: Negative for blood in stool, constipation and diarrhea.  Genitourinary: Negative for dysuria, frequency, hematuria and urgency.  Musculoskeletal: Positive for back pain, joint pain and neck pain.  Neurological: Positive for tingling and sensory change.  Endo/Heme/Allergies: Bruises/bleeds easily.  Psychiatric/Behavioral: Positive for memory loss.    Past Medical History:  Diagnosis Date  . Allergy    takes Mucinex daily as needed  . Anemia, unspecified   . Anxiety    takes Clonazepam daily as needed  . Arthritis   . CHF (congestive heart failure) (HCC)    takes Furosemide daily  . Coronary artery disease    a. s/p IMI 2004 tx with BMS to RCA;  b. s/p Promus DES to RCA 2/12 c. 06/2014 High risk stress test follow up cath 06/2014 showd patent stent--< med therapy for other mild to moedrate disease   . Coronary atherosclerosis of native coronary artery   . Depression  takes Cymbalta daily  . Diabetes mellitus    insulin daily  . Dysuria   . GERD (gastroesophageal reflux disease)    takes Protonix daily  . Gout, unspecified    takes Colchicine daily  . Headache    occasionally  . History of blood clots about 48yr ago   in legs-takes Coumadin   . Hyperlipidemia    takes Pravastatin daily  . Hypertension    takes Imdur and Metoprolol daily  . Insomnia   . Joint pain   . Joint  swelling   . [place]  . Numbness   . Obstructive sleep apnea   . Osteoarthritis   . Osteoarthritis   . Osteoarthrosis, unspecified whether generalized or localized, lower leg   . Pain, chronic   . Pol[place]algia rheumatica ([place]   . Pulmonary [place]oli ([place] 9/13   felt to need lifelong anticoagulation  . Shortness of breath dyspnea    with exertion and has Albuterol inhaler prn  . Type II or unspecified type diabetes mellitus without mention of complication, uncontrolled   . Urinary incontinence    takes Linzess daily  . Vertigo    hx of;was taking Meclizine if needed   Past Surgical History:  Procedure Laterality Date  . ABDOMINAL HYSTERECTOMY     partial  . APPENDECTOMY    . blood clots/legs and lungs  2013  . BREAST BIOPSY Left 07/22/2014  . BREAST BIOPSY Left 02/10/2013  . BREAST LUMPECTOMY Left 11/05/2014  . BREAST LUMPECTOMY WITH RADIOACTIVE SEED LOCALIZATION Left 11/05/2014   Procedure: LEFT BREAST LUMPECTOMY WITH RADIOACTIVE SEED LOCALIZATION;  Surgeon: DCoralie Keens MD;  [place]  Service: General;  Laterality: Left;  . CARDIAC CATHETERIZATION    . COLONOSCOPY    . CORONARY ANGIOPLASTY  2  . ESOPHAGOGASTRODUODENOSCOPY (EGD) WITH PROPOFOL N/A 11/07/2016   Procedure: ESOPHAGOGASTRODUODENOSCOPY (EGD) WITH PROPOFOL;  Surgeon: GGatha Mayer MD;  [facility];  Service: Endoscopy;  Laterality: N/A;  . EXCISION OF SKIN TAG Right 11/05/2014   Procedure: EXCISION OF RIGHT EYELID SKIN TAG;  Surgeon: DCoralie Keens MD;  [place] [facility]  Service: General;  Laterality: Right;  . EYE SURGERY Bilateral    cataract   . GASTRIC BYPASS  1977    [place] 06/[place]/2014   Procedure: LEFT HEART CATHETERIZATION WITH CORONARY ANGIOGRAM;  Surgeon: TTroy Sine MD;  [facility];  Service: Cardiovascular;  Laterality: N/A;  . MI with stent placement  2004    Social History:   reports that she has never smoked. She has never used smokeless tobacco. She reports that she does not drink alcohol or use drugs.  Family History  Problem Relation Age of Onset  . Breast cancer Mother 680 . Heart disease Mother   . Throat cancer Father   . Hypertension Father   . Arthritis Father   . Diabetes Father   . Arthritis Sister   . Obesity Sister   . Diabetes Sister   . Heart disease Cousin   . Colon cancer Neg Hx   . Stomach cancer Neg Hx   . Esophageal cancer Neg Hx     Medications: Patient's Medications  New Prescriptions   No medications on file  Previous Medications   ALBUTEROL (PROVENTIL HFA;VENTOLIN HFA) 108 (90 BASE) MCG/ACT INHALER    Inhale 1-2 puffs into the lungs every 6 (six) hours as needed for wheezing or  shortness of breath.   ANASTROZOLE (ARIMIDEX) 1 MG TABLET    TAKE 1 TABLET BY MOUTH EVERY DAY   BIOTIN 21308 MCG TABS    Take 1 tablet by mouth daily.   BUDESONIDE-FORMOTEROL (SYMBICORT) 160-4.5 MCG/ACT INHALER    Inhale 2 puffs into the lungs 2 (two) times daily. For bronchitis   CHOLECALCIFEROL (VITAMIN D3) 5000 UNITS CAPS    Take 1 capsule by mouth daily.   CLONAZEPAM (KLONOPIN) 1 MG TABLET    TAKE 2 TABLETS BY MOUTH AT BEDTIME AS NEEDED FOR ANXIETY   CONTINUOUS BLOOD GLUC RECEIVER (FREESTYLE LIBRE 14 DAY READER) DEVI    1 Device by Does not apply route 4 (four) times daily -  before meals and at bedtime. For diabetes   CONTINUOUS BLOOD GLUC SENSOR (FREESTYLE LIBRE 14 DAY SENSOR) MISC    1 Units by Does not apply route 4 (four) times daily -  before meals and at bedtime. For diabetes   DULOXETINE (CYMBALTA) 60 MG CAPSULE    Take one capsule by mouth once daily for anxiety   ESTRADIOL (ESTRACE) 0.1 MG/GM VAGINAL CREAM    Use every other evening use in vaginal area    Uses as needed   FEBUXOSTAT (ULORIC) 40 MG TABLET    Take 1 tablet (40 mg total) by mouth daily.   FERROUS GLUCONATE (FERGON) 324 MG TABLET    Take 1 tablet (324 mg  total) by mouth daily with breakfast.   FUROSEMIDE (LASIX) 20 MG TABLET    TAKE 1 TABLET BY MOUTH EVERY DAY AS NEEDED   HYDROXYZINE (ATARAX/VISTARIL) 10 MG TABLET    Take 1 tablet (10 mg total) by mouth 3 (three) times daily as needed for itching.   INSULIN GLARGINE (TOUJEO SOLOSTAR) 300 UNIT/ML SOPN    Inject 40 Units into the skin 2 (two) times daily. (before breakfast and before supper)   ISOSORBIDE MONONITRATE (IMDUR) 60 MG 24 HR TABLET    TAKE 1 TABLET BY MOUTH EVERY DAY   MAGNESIUM OXIDE (MAG-OX) 400 MG TABLET    Take 1 tablet (400 mg total) by mouth daily.   MECLIZINE (ANTIVERT) 25 MG TABLET    TAKE 1 TABLET BY MOUTH THREE TIMES DAILY AS NEEDED FOR DIZZINESS   MEMANTINE (NAMENDA) 5 MG TABLET    Take 2 tablets by mouth in the morning for memory   METOPROLOL SUCCINATE (TOPROL-XL) 25 MG 24 HR TABLET    TAKE 1 TABLET BY MOUTH EVERY DAY FOR blood pressure   NITROGLYCERIN (NITROSTAT) 0.4 MG SL TABLET    Take one tablet under the tongue every 5 minutes as needed for chest pain   NOVOLOG FLEXPEN 100 UNIT/ML FLEXPEN    inject 3x DAILY BEFORE MEALS;IF 140-199, give 2 U. IF 200-250, 4 U. IF 251-299, 6 U. IF 300-349, 8 U. IF >350, 10 U. INJ 5 U NOVOLOG+SLIDING   OXYCODONE-ACETAMINOPHEN (PERCOCET) 10-325 MG TABLET    Take 1 tablet by mouth every 6 (six) hours.   PANTOPRAZOLE (PROTONIX) 40 MG TABLET    TAKE 1 TABLET BY MOUTH DAILY   PRAVASTATIN (PRAVACHOL) 20 MG TABLET    TAKE 1 TABLET BY MOUTH EVERY DAY FOR cholesterol   RIVAROXABAN (XARELTO) 10 MG TABS TABLET    Take 1 tablet (10 mg total) by mouth daily.   VITAMIN B-12 (CYANOCOBALAMIN) 1000 MCG TABLET    Take 1,000 mcg by mouth daily.  Modified Medications   Modified Medication Previous Medication   HYDRALAZINE (APRESOLINE) 50 MG TABLET hydrALAZINE (  APRESOLINE) 50 MG tablet      TAKE 1 TABLET BY MOUTH 2 TIMES DAILY    Take 1 tablet (50 mg total) by mouth 2 (two) times daily.   TDAP (BOOSTRIX) 5-2.5-18.5 LF-MCG/0.5 INJECTION Tdap (BOOSTRIX) 5-2.5-18.5  LF-MCG/0.5 injection      Inject 0.5 mLs into the muscle once for 1 dose.    Inject 0.5 mLs into the muscle once.  Discontinued Medications   DULOXETINE (CYMBALTA) 60 MG CAPSULE    Take 60 mg by mouth daily.   GLUCOSE BLOOD (ONETOUCH VERIO) TEST STRIP    USE 1 STRIP TO CHECK GLUCOSE THREE TIMES DAILY  E11.21   LANCET DEVICE MISC    Onetouch verio machine lancets, check blood sugar twice daily as directed DX E11.69     Physical Exam:  Vitals:   06/03/18 1309  BP: (!) 144/82  Pulse: 78  Temp: 97.6 F (36.4 C)  TempSrc: Oral  SpO2: 95%  Weight: (!) 324 lb (147 kg)  Height: '5\' 7"'  (1.702 m)   Body mass index is 50.75 kg/m.  Physical Exam Constitutional:      Appearance: Normal appearance. She is well-developed.  HENT:     Mouth/Throat:     Pharynx: No oropharyngeal exudate.  Eyes:     General: No scleral icterus.    Pupils: Pupils are equal, round, and reactive to light.  Neck:     Musculoskeletal: Neck supple.     Thyroid: No thyromegaly.     Vascular: No carotid bruit.     Trachea: No tracheal deviation.  Cardiovascular:     Rate and Rhythm: Normal rate and regular rhythm.     Pulses: Normal pulses.          Dorsalis pedis pulses are 2+ on the right side and 2+ on the left side.     Heart sounds: Murmur (1/6 SEM) present. No friction rub. No gallop.      Comments: Trace LE edema b/l. No calf TTP Pulmonary:     Effort: Pulmonary effort is normal. No respiratory distress.     Breath sounds: No stridor. No wheezing or rales.  Abdominal:     General: Bowel sounds are normal. There is no distension.     Palpations: Abdomen is soft. Abdomen is not rigid. There is no hepatomegaly or mass.     Tenderness: There is no abdominal tenderness. There is no guarding or rebound.     Hernia: No hernia is present.     Comments: obese  Musculoskeletal:        General: Tenderness present.  Feet:     Right foot:     Protective Sensation: 3 sites tested. 3 sites sensed.     Skin  integrity: Skin integrity normal.     Left foot:     Protective Sensation: 4 sites tested. 2 sites sensed.     Skin integrity: Skin integrity normal.  Lymphadenopathy:     Cervical: No cervical adenopathy.  Skin:    General: Skin is warm and dry.     Findings: No rash.  Neurological:     Mental Status: She is alert and oriented to person, place, and time.     Deep Tendon Reflexes: Reflexes are normal and symmetric.  Psychiatric:        Behavior: Behavior normal.        Thought Content: Thought content normal.     Labs reviewed: Basic Metabolic Panel: Recent Labs    10/09/17 0049 10/09/17 0715 10/10/17  0448  10/24/17 1227 11/27/17 1326 03/11/18 1557 05/28/18 1324  NA  --  139 134*   < > 133* 136 136 137  K  --  3.8 4.1   < > 5.4* 5.5* 5.1 5.5*  CL  --  99* 101   < > 98 103 104 102  CO2  --  29 28   < > '27 25 28 26  ' GLUCOSE  --  214* 273*   < > 211* 284* 246* 301*  BUN  --  22* 25*   < > 37* 32* 35* 34*  CREATININE  --  1.23* 1.47*   < > 1.86* 1.65* 1.41* 1.62*  CALCIUM  --  9.1 8.4*   < > 8.8 9.7 9.0 9.3  MG  --  1.2* 1.5*  --  1.4*  --   --   --   PHOS  --  3.7  --   --   --   --   --   --   TSH 0.884  --   --   --   --   --   --   --    < > = values in this interval not displayed.   Liver Function Tests: Recent Labs    10/23/17 1418 11/27/17 1326 03/11/18 1557 05/28/18 1324  AST 13 10* 11 10*  ALT '7 9 8 7  ' ALKPHOS 85 89  --  80  BILITOT 0.4 0.3 0.2 0.4  PROT 7.8 8.4* 7.3 7.9  ALBUMIN 2.8* 3.0*  --  3.0*   No results for input(s): LIPASE, AMYLASE in the last 8760 hours. No results for input(s): AMMONIA in the last 8760 hours. CBC: Recent Labs    11/27/17 1326 03/11/18 1557 05/28/18 1324  WBC 11.8* 11.7* 10.2  NEUTROABS 8.7* 8,213* 7.1  HGB 9.1* 10.1* 9.4*  HCT 30.3* 30.9* 31.1*  MCV 83.0 88.3 95.7  PLT 303 290 270   Lipid Panel: Recent Labs    06/08/17 1015  CHOL 92  HDL 38*  LDLCALC 37  TRIG 89  CHOLHDL 2.4   TSH: Recent Labs     10/09/17 0049  TSH 0.884   A1C: Lab Results  Component Value Date   HGBA1C 10.2 (H) 03/11/2018     Assessment/Plan 1. Hyperkalemia -does not consume a lot of food/supplement high in potassium  - BMP with eGFR([facility])  2. Memory loss Ongoing. Tolerating namenda now without side effects.  - memantine (NAMENDA) 5 MG tablet; Take 2 tablets by mouth in the morning and 1 tablet in the evening  for memory  Dispense: 60 tablet; Refill: 3  3. Uncontrolled type 2 diabetes mellitus with diabetic nephropathy, with long-term current use of insulin (HCC) -will continue toujeo 40 units BID and will increase novolog to 8 units with meals with SSI. Encouraged to modification.  - insulin aspart (NOVOLOG FLEXPEN) 100 UNIT/ML FlexPen; inject DAILY BEFORE MEALS;IF 140-199, give 2 U. IF 200-250, 4 U. IF 251-299, 6 U. IF 300-349, 8 U. IF >350, 10 U. INJ 8 U NOVOLOG+SLIDING  Dispense: 15 mL; Refill: 3 - Hemoglobin A1c; Future  4. Debility Weak with increase debility due to chronic pain due to OA. Very sedentary and has attempted PT without benefit. Does not which for [facility] referral. Information provided for personal care assistance  5. Mixed hyperlipidemia Continues on statin - Lipid Panel; Future  6. At risk for adverse drug event -encouraged to take pain medication has directed. 55% risk for adverse drug reaction  based on co-morbidies and current medications. Discussed that we may need to titrate medication in the future to reduce adverse drug reaction.   Next appt: 6 weeks for diabetes, A1c prior to appt Noah Lembke K. [place], [name] [phone]

## 2018-06-04 ENCOUNTER — Other Ambulatory Visit: Payer: Self-pay | Admitting: Nurse Practitioner

## 2018-06-04 DIAGNOSIS — E875 Hyperkalemia: Secondary | ICD-10-CM

## 2018-06-04 LAB — BASIC METABOLIC PANEL WITH GFR
BUN/Creatinine Ratio: 23 (calc) — ABNORMAL HIGH (ref 6–22)
BUN: 39 mg/dL — ABNORMAL HIGH (ref 7–25)
CO2: 27 mmol/L (ref 20–32)
Calcium: 9 mg/dL (ref 8.6–10.4)
Chloride: 103 mmol/L (ref 98–110)
Creat: 1.73 mg/dL — ABNORMAL HIGH (ref 0.60–0.88)
GFR, Est African American: 32 mL/min/{1.73_m2} — ABNORMAL LOW (ref >=60)
GFR, Est Non African American: 27 mL/min/{1.73_m2} — ABNORMAL LOW (ref >=60)
Glucose, Bld: 167 mg/dL — ABNORMAL HIGH (ref 65–99)
Potassium: 5.9 mmol/L — ABNORMAL HIGH (ref 3.5–5.3)
Sodium: 137 mmol/L (ref 135–146)

## 2018-06-04 MED ORDER — RIVAROXABAN 10 MG PO TABS: 10.0000 mg | ORAL_TABLET | Freq: Every day | ORAL | 1 refills | Status: DC

## 2018-06-04 MED ORDER — SODIUM POLYSTYRENE SULFONATE 15 GM/60ML PO SUSP: 15.0000 g | Freq: Once | ORAL | 0 refills | Status: AC

## 2018-06-05 ENCOUNTER — Telehealth: Payer: Self-pay

## 2018-06-05 DIAGNOSIS — E875 Hyperkalemia: Secondary | ICD-10-CM

## 2018-06-05 NOTE — Telephone Encounter (Signed)
Per verbal conversation with Josph Macho was already sent on 06/04/2018

## 2018-06-05 NOTE — Telephone Encounter (Signed)
Would like her to come in on Thursday 1/30 thank you

## 2018-06-05 NOTE — Telephone Encounter (Signed)
-----   Message from [name], NP sent at 06/04/2018  9:23 AM EST ----- Potassium continues to go up. Is there any supplement at home (drink, food, pill) that is high in potassium that she is taking? Her renal function is also worse. Needs to be drinking more water. Has she been taking the lasix?  We need to bring the potassium down-- she needs to take Kayexalate 15 gm by mouth today (this will make her have a bowel movement to remove some of the excess potassium) and lets repeat BMP (hyperkalemia) on 1/30 to help bring potassium down.

## 2018-06-05 NOTE — Telephone Encounter (Signed)
Discussed results with patient, patient verbalized understanding of results  Scheduled appointment for Friday to recheck potassium  Future order placed for BMP  RX pending for [name], NP to confirm  prior to sending (send to [employer])

## 2018-06-07 ENCOUNTER — Other Ambulatory Visit: Payer: PPO

## 2018-06-07 DIAGNOSIS — E875 Hyperkalemia: Secondary | ICD-10-CM

## 2018-06-07 LAB — BASIC METABOLIC PANEL
BUN/Creatinine Ratio: 22 (calc) (ref 6–22)
BUN: 37 mg/dL — ABNORMAL HIGH (ref 7–25)
CO2: 27 mmol/L (ref 20–32)
Calcium: 9.3 mg/dL (ref 8.6–10.4)
Chloride: 103 mmol/L (ref 98–110)
Creat: 1.72 mg/dL — ABNORMAL HIGH (ref 0.60–0.88)
Glucose, Bld: 134 mg/dL — ABNORMAL HIGH (ref 65–99)
Potassium: 5.4 mmol/L — ABNORMAL HIGH (ref 3.5–5.3)
Sodium: 138 mmol/L (ref 135–146)

## 2018-06-08 DIAGNOSIS — M1990 Unspecified osteoarthritis, unspecified site: Secondary | ICD-10-CM | POA: Diagnosis not present

## 2018-06-08 DIAGNOSIS — R0689 Other abnormalities of breathing: Secondary | ICD-10-CM | POA: Diagnosis not present

## 2018-06-08 DIAGNOSIS — E669 Obesity, unspecified: Secondary | ICD-10-CM | POA: Diagnosis not present

## 2018-06-08 DIAGNOSIS — E875 Hyperkalemia: Secondary | ICD-10-CM | POA: Diagnosis not present

## 2018-06-08 DIAGNOSIS — G8929 Other chronic pain: Secondary | ICD-10-CM | POA: Diagnosis not present

## 2018-06-08 DIAGNOSIS — D649 Anemia, unspecified: Secondary | ICD-10-CM | POA: Diagnosis not present

## 2018-06-08 DIAGNOSIS — I5040 Unspecified combined systolic (congestive) and diastolic (congestive) heart failure: Secondary | ICD-10-CM | POA: Diagnosis not present

## 2018-06-10 ENCOUNTER — Other Ambulatory Visit: Payer: Self-pay | Admitting: Nurse Practitioner

## 2018-06-10 DIAGNOSIS — N183 Chronic kidney disease, stage 3 unspecified: Secondary | ICD-10-CM

## 2018-06-10 DIAGNOSIS — E875 Hyperkalemia: Secondary | ICD-10-CM

## 2018-06-10 MED ORDER — SODIUM POLYSTYRENE SULFONATE 15 GM/60ML PO SUSP: 15.0000 g | Freq: Once | ORAL | 0 refills | Status: AC

## 2018-06-11 ENCOUNTER — Other Ambulatory Visit: Payer: Self-pay

## 2018-06-11 ENCOUNTER — Ambulatory Visit: Payer: PPO | Admitting: Nurse Practitioner

## 2018-06-12 ENCOUNTER — Other Ambulatory Visit: Payer: Self-pay

## 2018-06-12 DIAGNOSIS — E878 Other disorders of electrolyte and fluid balance, not elsewhere classified: Secondary | ICD-10-CM

## 2018-06-12 DIAGNOSIS — E875 Hyperkalemia: Secondary | ICD-10-CM

## 2018-06-18 DIAGNOSIS — C50912 Malignant neoplasm of unspecified site of left female breast: Secondary | ICD-10-CM | POA: Diagnosis not present

## 2018-06-18 DIAGNOSIS — Z6841 Body Mass Index (BMI) 40.0 and over, adult: Secondary | ICD-10-CM | POA: Diagnosis not present

## 2018-06-18 DIAGNOSIS — I129 Hypertensive chronic kidney disease with stage 1 through stage 4 chronic kidney disease, or unspecified chronic kidney disease: Secondary | ICD-10-CM | POA: Diagnosis not present

## 2018-06-18 DIAGNOSIS — G4733 Obstructive sleep apnea (adult) (pediatric): Secondary | ICD-10-CM | POA: Diagnosis not present

## 2018-06-18 DIAGNOSIS — N390 Urinary tract infection, site not specified: Secondary | ICD-10-CM | POA: Diagnosis not present

## 2018-06-18 DIAGNOSIS — N2581 Secondary hyperparathyroidism of renal origin: Secondary | ICD-10-CM | POA: Diagnosis not present

## 2018-06-18 DIAGNOSIS — N189 Chronic kidney disease, unspecified: Secondary | ICD-10-CM | POA: Diagnosis not present

## 2018-06-18 DIAGNOSIS — N183 Chronic kidney disease, stage 3 (moderate): Secondary | ICD-10-CM | POA: Diagnosis not present

## 2018-06-18 DIAGNOSIS — E1122 Type 2 diabetes mellitus with diabetic chronic kidney disease: Secondary | ICD-10-CM | POA: Diagnosis not present

## 2018-06-18 DIAGNOSIS — I2510 Atherosclerotic heart disease of native coronary artery without angina pectoris: Secondary | ICD-10-CM | POA: Diagnosis not present

## 2018-06-18 DIAGNOSIS — I5030 Unspecified diastolic (congestive) heart failure: Secondary | ICD-10-CM | POA: Diagnosis not present

## 2018-06-19 ENCOUNTER — Other Ambulatory Visit: Payer: PPO

## 2018-06-19 DIAGNOSIS — E875 Hyperkalemia: Secondary | ICD-10-CM | POA: Diagnosis not present

## 2018-06-19 DIAGNOSIS — E878 Other disorders of electrolyte and fluid balance, not elsewhere classified: Secondary | ICD-10-CM

## 2018-06-19 LAB — BASIC METABOLIC PANEL WITH GFR
BUN/Creatinine Ratio: 24 (calc) — ABNORMAL HIGH (ref 6–22)
BUN: 44 mg/dL — ABNORMAL HIGH (ref 7–25)
CO2: 26 mmol/L (ref 20–32)
Calcium: 8.8 mg/dL (ref 8.6–10.4)
Chloride: 104 mmol/L (ref 98–110)
Creat: 1.87 mg/dL — ABNORMAL HIGH (ref 0.60–0.88)
GFR, Est African American: 29 mL/min/{1.73_m2} — ABNORMAL LOW (ref >=60)
GFR, Est Non African American: 25 mL/min/{1.73_m2} — ABNORMAL LOW (ref >=60)
Glucose, Bld: 197 mg/dL — ABNORMAL HIGH (ref 65–139)
Potassium: 4.6 mmol/L (ref 3.5–5.3)
Sodium: 137 mmol/L (ref 135–146)

## 2018-06-20 ENCOUNTER — Other Ambulatory Visit: Payer: Self-pay | Admitting: *Deleted

## 2018-06-20 DIAGNOSIS — G894 Chronic pain syndrome: Secondary | ICD-10-CM

## 2018-06-20 MED ORDER — OXYCODONE-ACETAMINOPHEN 10-325 MG PO TABS: 1.0000 | ORAL_TABLET | Freq: Four times a day (QID) | ORAL | 0 refills | Status: DC

## 2018-06-20 NOTE — Telephone Encounter (Signed)
Patient Requested refill [place]CCS[place] Database Verified LR: 05/21/2018 Pended Rx and sent to [facility] for approval.

## 2018-06-25 ENCOUNTER — Other Ambulatory Visit (HOSPITAL_COMMUNITY): Payer: Self-pay | Admitting: *Deleted

## 2018-06-25 DIAGNOSIS — H35371 Puckering of macula, right eye: Secondary | ICD-10-CM | POA: Diagnosis not present

## 2018-06-25 DIAGNOSIS — H35362 Drusen (degenerative) of macula, left eye: Secondary | ICD-10-CM | POA: Diagnosis not present

## 2018-06-25 DIAGNOSIS — H353112 Nonexudative age-related macular degeneration, right eye, intermediate dry stage: Secondary | ICD-10-CM | POA: Diagnosis not present

## 2018-06-25 DIAGNOSIS — H43821 Vitreomacular adhesion, right eye: Secondary | ICD-10-CM | POA: Diagnosis not present

## 2018-06-25 DIAGNOSIS — E119 Type 2 diabetes mellitus without complications: Secondary | ICD-10-CM | POA: Diagnosis not present

## 2018-06-25 DIAGNOSIS — H43811 Vitreous degeneration, right eye: Secondary | ICD-10-CM | POA: Diagnosis not present

## 2018-06-26 ENCOUNTER — Encounter (HOSPITAL_COMMUNITY)
Admission: RE | Admit: 2018-06-26 | Discharge: 2018-06-26 | Disposition: A | Discharge status: Home / Self Care | Payer: PPO | Source: Ambulatory Visit | Attending: Nephrology | Admitting: Nephrology

## 2018-06-26 DIAGNOSIS — D631 Anemia in chronic kidney disease: Secondary | ICD-10-CM | POA: Diagnosis not present

## 2018-06-26 DIAGNOSIS — N183 Chronic kidney disease, stage 3 (moderate): Secondary | ICD-10-CM | POA: Insufficient documentation

## 2018-06-26 MED ORDER — SODIUM CHLORIDE 0.9 % IV SOLN
510.0000 mg | INTRAVENOUS | Status: DC
  Administered 2018-06-26: 510 mg via INTRAVENOUS
  Filled 2018-06-26: qty 510

## 2018-07-02 ENCOUNTER — Other Ambulatory Visit: Payer: Self-pay | Admitting: Nurse Practitioner

## 2018-07-02 DIAGNOSIS — R413 Other amnesia: Secondary | ICD-10-CM

## 2018-07-02 MED ORDER — MEMANTINE HCL 5 MG PO TABS: ORAL_TABLET | ORAL | 1 refills | Status: DC

## 2018-07-02 NOTE — Telephone Encounter (Signed)
[facility].

## 2018-07-03 ENCOUNTER — Encounter (HOSPITAL_COMMUNITY): Payer: Self-pay

## 2018-07-03 ENCOUNTER — Emergency Department (HOSPITAL_COMMUNITY)
Admission: EM | Admit: 2018-07-03 | Discharge: 2018-07-03 | Disposition: A | Discharge status: Home / Self Care | Payer: PPO | Attending: Emergency Medicine | Admitting: Emergency Medicine

## 2018-07-03 ENCOUNTER — Encounter (HOSPITAL_COMMUNITY): Payer: Self-pay | Admitting: *Deleted

## 2018-07-03 ENCOUNTER — Ambulatory Visit (HOSPITAL_COMMUNITY)
Admission: RE | Admit: 2018-07-03 | Discharge: 2018-07-03 | Disposition: A | Discharge status: Home / Self Care | Payer: PPO | Source: Ambulatory Visit | Attending: Nephrology | Admitting: Nephrology

## 2018-07-03 DIAGNOSIS — Z5321 Procedure and treatment not carried out due to patient leaving prior to being seen by health care provider: Secondary | ICD-10-CM | POA: Diagnosis not present

## 2018-07-03 DIAGNOSIS — R079 Chest pain, unspecified: Secondary | ICD-10-CM | POA: Diagnosis not present

## 2018-07-03 LAB — CBG MONITORING, ED: Glucose-Capillary: 166 mg/dL — ABNORMAL HIGH (ref 70–99)

## 2018-07-03 MED ORDER — SODIUM CHLORIDE 0.9 % IV SOLN
510.0000 mg | INTRAVENOUS | Status: DC
  Administered 2018-07-03: 510 mg via INTRAVENOUS
  Filled 2018-07-03: qty 510

## 2018-07-03 NOTE — ED Notes (Signed)
Pt stated that she wanted to leave. Turned in stickers to this tech. RN aware.

## 2018-07-03 NOTE — ED Triage Notes (Signed)
Pt down from medical day care after possible reaction to contrast dye, pt developed chest pain and dizziness immediately after injection, symptoms have resolved at this time, denies shortness of breath or chest pain currently, no distress noted

## 2018-07-03 NOTE — Progress Notes (Signed)
Hung patient's second dose of IV feraheme.  After 4 minutes she complained of feeling dizzy.  I paused the feraheme and checked VS which were stable.    Pt has history of vertigo so I asked her if she felt like she does when she is having a vertigo problem and she said yes, about the same.  So I stayed with her and restarted the feraheme at half the rate.  After one minute the pt stated she had dizziness and chest pressure so I stopped the Iron, obtained VS which were again stable and called [facility] and reported the above to [employer]. Pt stated that after her first feraheme she "got real sick in the car, dizzy and chest pressure, no nausea."  Pt denies shortness of breath, and is not diaphoretic.  She is diabetic so I checked her glucose level which was 166.  Pt states chest pressure 8/10 but when I asked her if it was enough that shes feeling like she needs to be seen in the ER she stated no. Dr Marval Regal called back and stated he would like her to be seen in the ER and I asked the patient if she would go and she said she would.  Pt continues to say she has 8/10 chest pressure with pain in her neck on the right side, which she doesn't normally have and she continues to have dizziness.   Will call report to ER charge and notify pt's daughter.

## 2018-07-05 ENCOUNTER — Other Ambulatory Visit: Payer: Self-pay | Admitting: *Deleted

## 2018-07-05 ENCOUNTER — Encounter: Payer: Self-pay | Admitting: *Deleted

## 2018-07-05 NOTE — Patient Outreach (Signed)
[facility]) Care Management  07/05/2018  [name] 1934/07/25 [id number]   RN Health Coach Post ER Follow Up  Referral Date: 12/11/2017 Referral Source: Transfer from [facility] Nurse Reason for Referral: Continued Disease Management Education/Chronic Care Improvement Program Insurance: [employer]   Outreach Attempt:  Successful telephone outreach to patient and patient's daughter, [name] for post emergency room follow up.  HIPAA verified with daughter, [name] (Release of Information is on file).  Reports patient was having an iron infusion in Short Stay and started to experience an allergic reaction and having chest pains and was taking to the emergency room.  States patient waited in the emergency room for hours in the waiting area without being seen.  Multiple sick patients coughing so they decided to take patient home to prevent her from catching something while waiting.  States chest pain was resolved once iron infusion was stopped.  Patient and daughter reporting this was patients second infusion.  After the first infusion patient had reaction once she was at home.  This time reaction started while iron was infusing.  Denies any increase in shortness of breath, rash, or redness.  Does endorse increase in itching of bilateral arms and hands and patient is taking hydroxyzine to assist with the itching.  Daughter states she has contacted Nephrologist office (phsycian ordering the iron infusion) and has spoken with nurse and is awaiting call back from the nurse.  Encouraged daughter to call office back if no response by 3 pm today to follow up with itching.  Reviewed signs and symptoms of reaction with daughter and patient (chest pain, increase shortness of breath, rash, severe back pain) and encouraged them to seek medical attention if patient developed increased signs and symptoms of reaction.  Patient has scheduled follow up appointment on 07/17/2018 with  primary care provider.  Plan:  RN Health Coach will make next telephone outreach to patient with in the month of March.  [facility] [phone] [email or web address]

## 2018-07-15 ENCOUNTER — Other Ambulatory Visit: Payer: PPO

## 2018-07-16 ENCOUNTER — Ambulatory Visit: Payer: PPO | Admitting: *Deleted

## 2018-07-17 ENCOUNTER — Other Ambulatory Visit: Payer: Self-pay

## 2018-07-17 ENCOUNTER — Encounter: Payer: Self-pay | Admitting: Nurse Practitioner

## 2018-07-17 ENCOUNTER — Ambulatory Visit (INDEPENDENT_AMBULATORY_CARE_PROVIDER_SITE_OTHER): Payer: PPO | Admitting: Nurse Practitioner

## 2018-07-17 VITALS — BP 132/78 | HR 88 | Temp 98.6°F | Ht 67.0 in | Wt 323.0 lb

## 2018-07-17 DIAGNOSIS — E1140 Type 2 diabetes mellitus with diabetic neuropathy, unspecified: Secondary | ICD-10-CM | POA: Diagnosis not present

## 2018-07-17 DIAGNOSIS — J309 Allergic rhinitis, unspecified: Secondary | ICD-10-CM

## 2018-07-17 DIAGNOSIS — E782 Mixed hyperlipidemia: Secondary | ICD-10-CM

## 2018-07-17 DIAGNOSIS — E875 Hyperkalemia: Secondary | ICD-10-CM

## 2018-07-17 DIAGNOSIS — G894 Chronic pain syndrome: Secondary | ICD-10-CM

## 2018-07-17 DIAGNOSIS — Z794 Long term (current) use of insulin: Secondary | ICD-10-CM | POA: Diagnosis not present

## 2018-07-17 DIAGNOSIS — N183 Chronic kidney disease, stage 3 unspecified: Secondary | ICD-10-CM

## 2018-07-17 DIAGNOSIS — E1121 Type 2 diabetes mellitus with diabetic nephropathy: Secondary | ICD-10-CM | POA: Diagnosis not present

## 2018-07-17 DIAGNOSIS — E1165 Type 2 diabetes mellitus with hyperglycemia: Secondary | ICD-10-CM | POA: Diagnosis not present

## 2018-07-17 MED ORDER — OXYCODONE-ACETAMINOPHEN 10-325 MG PO TABS: 1.0000 | ORAL_TABLET | Freq: Four times a day (QID) | ORAL | 0 refills | Status: DC

## 2018-07-17 MED ORDER — ZOSTER VAC RECOMB ADJUVANTED 50 MCG/0.5ML IM SUSR: 0.5000 mL | Freq: Once | INTRAMUSCULAR | 1 refills | Status: AC

## 2018-07-17 NOTE — Progress Notes (Signed)
Careteam: Patient Care Team: [name], NP as PCP - General (Geriatric Medicine) [name], [name], MD (Inactive) as Consulting Physician (Cardiology) [name], MD as Consulting Physician (Ophthalmology) [name], MD as Consulting Physician (General Surgery) [name], MD (Inactive) as Referring Physician (Obstetrics and Gynecology) Leona Singleton, RN as [facility] Rankin, [name], MD as Consulting Physician (Ophthalmology)  Advanced Directive information    Allergies  Allergen Reactions  . Sulfonamide Derivatives Swelling    Mouth swelling  . Tramadol Nausea And Vomiting  . Aricept [Donepezil Hcl]     GI upset/loose stools    Chief Complaint  Patient presents with  . Medical Management of Chronic Issues    6 week follow-up   . URI    Patient c/o cough, congestion and runny nose   . Best Practice Recommendations    Patient states eye exam UTD   . Immunizations    Refused Tdap, RX for shingrix printed   . Medication Refill    Refill Oxycodone      HPI: Patient is a 84 y.o. female seen in the office today for follow up on blood sugars  DM- did not come to office prior to appt for A1c, continues on toujeo 40 units BID with novolog 8 units with meals plus SSI Having low blood sugars in the morning- 60s daily; symptoms include feeling bad but once she eats she feels. Just just put on a new libra and old data has been deleted but sister reports evening blood sugars are better.    kidney function had worsen on previous labs with recurrent elevated potassium Referral to nephrology was placed and she has seen Dr Marval Regal for stage 3 kidney disease, plans to follow up.   For about 1 month increase in nasal drainage (clear), itchy watery eyes and sneezing. Denies congestion or feeling poorly.   Review of Systems:  Review of Systems  Constitutional: Negative for chills, fever and malaise/fatigue.  HENT:       Runny  nose, sneezing and watery eyes for about 1 month, took something but did not help  Respiratory: Negative for hemoptysis and shortness of breath.   Cardiovascular: Negative for chest pain.  Gastrointestinal: Negative for blood in stool, constipation and diarrhea.  Genitourinary: Negative for dysuria, frequency, hematuria and urgency.  Musculoskeletal: Positive for back pain, joint pain and neck pain.  Neurological: Positive for tingling and sensory change.  Endo/Heme/Allergies: Bruises/bleeds easily.  Psychiatric/Behavioral: Positive for memory loss.    Past Medical History:  Diagnosis Date  . Allergy    takes Mucinex daily as needed  . Anemia, unspecified   . Anxiety    takes Clonazepam daily as needed  . Arthritis   . CHF (congestive heart failure) (HCC)    takes Furosemide daily  . Coronary artery disease    a. s/p IMI 2004 tx with BMS to RCA;  b. s/p Promus DES to RCA 2/12 c. 06/2014 High risk stress test follow up cath 06/2014 showd patent stent--< med therapy for other mild to moedrate disease   . Coronary atherosclerosis of native coronary artery   . Depression    takes Cymbalta daily  . Diabetes mellitus    insulin daily  . Dysuria   . GERD (gastroesophageal reflux disease)    takes Protonix daily  . Gout, unspecified    takes Colchicine daily  . Headache    occasionally  . History of blood clots about 13yrs ago  in legs-takes Coumadin   . Hyperlipidemia    takes Pravastatin daily  . Hypertension    takes Imdur and Metoprolol daily  . Insomnia   . Joint pain   . Joint swelling   . Myocardial infarction ([place]) 2004  . Numbness   . Obstructive sleep apnea   . Osteoarthritis   . Osteoarthritis   . Osteoarthrosis, unspecified whether generalized or localized, lower leg   . Pain, chronic   . Polymyalgia rheumatica ([place])   . Pulmonary emboli ([place]) 9/13   felt to need lifelong anticoagulation  . Shortness of breath dyspnea    with exertion and has Albuterol inhaler  prn  . Type II or unspecified type diabetes mellitus without mention of complication, uncontrolled   . Urinary incontinence    takes Linzess daily  . Vertigo    hx of;was taking Meclizine if needed   Past Surgical History:  Procedure Laterality Date  . ABDOMINAL HYSTERECTOMY     partial  . APPENDECTOMY    . blood clots/legs and lungs  2013  . BREAST BIOPSY Left 07/22/2014  . BREAST BIOPSY Left 02/10/2013  . BREAST LUMPECTOMY Left 11/05/2014  . BREAST LUMPECTOMY WITH RADIOACTIVE SEED LOCALIZATION Left 11/05/2014   Procedure: LEFT BREAST LUMPECTOMY WITH RADIOACTIVE SEED LOCALIZATION;  Surgeon: [name], MD;  Location: [facility];  Service: General;  Laterality: Left;  . CARDIAC CATHETERIZATION    . COLONOSCOPY    . CORONARY ANGIOPLASTY  2  . ESOPHAGOGASTRODUODENOSCOPY (EGD) WITH PROPOFOL N/A 11/07/2016   Procedure: ESOPHAGOGASTRODUODENOSCOPY (EGD) WITH PROPOFOL;  Surgeon: Gatha Mayer, MD;  Location: [facility];  Service: Endoscopy;  Laterality: N/A;  . EXCISION OF SKIN TAG Right 11/05/2014   Procedure: EXCISION OF RIGHT EYELID SKIN TAG;  Surgeon: [name], MD;  Location: [facility];  Service: General;  Laterality: Right;  . EYE SURGERY Bilateral    cataract   . GASTRIC BYPASS  1977    reversed in 1979, [place] N/A 06/29/2014   Procedure: LEFT HEART CATHETERIZATION WITH CORONARY ANGIOGRAM;  Surgeon: Troy Sine, MD;  Location: [facility];  Service: Cardiovascular;  Laterality: N/A;  . MI with stent placement  2004   Social History:   reports that she has never smoked. She has never used smokeless tobacco. She reports that she does not drink alcohol or use drugs.  Family History  Problem Relation Age of Onset  . Breast cancer Mother 35  . Heart disease Mother   . Throat cancer Father   . Hypertension Father   . Arthritis Father   . Diabetes Father   . Arthritis Sister   . Obesity Sister   . Diabetes  Sister   . Heart disease Cousin   . Colon cancer Neg Hx   . Stomach cancer Neg Hx   . Esophageal cancer Neg Hx     Medications: Patient's Medications  New Prescriptions   No medications on file  Previous Medications   ALBUTEROL (PROVENTIL HFA;VENTOLIN HFA) 108 (90 BASE) MCG/ACT INHALER    Inhale 1-2 puffs into the lungs every 6 (six) hours as needed for wheezing or shortness of breath.   ANASTROZOLE (ARIMIDEX) 1 MG TABLET    TAKE 1 TABLET BY MOUTH EVERY DAY   BUDESONIDE-FORMOTEROL (SYMBICORT) 160-4.5 MCG/ACT INHALER    Inhale 2 puffs into the lungs 2 (two) times daily. For bronchitis   CHOLECALCIFEROL (VITAMIN D3) 5000 UNITS CAPS    Take 1 capsule  by mouth daily.   CLONAZEPAM (KLONOPIN) 1 MG TABLET    TAKE 2 TABLETS BY MOUTH AT BEDTIME AS NEEDED FOR ANXIETY   CONTINUOUS BLOOD GLUC RECEIVER (FREESTYLE LIBRE 14 DAY READER) DEVI    1 Device by Does not apply route 4 (four) times daily -  before meals and at bedtime. For diabetes   CONTINUOUS BLOOD GLUC SENSOR (FREESTYLE LIBRE 14 DAY SENSOR) MISC    1 Units by Does not apply route 4 (four) times daily -  before meals and at bedtime. For diabetes   DULOXETINE (CYMBALTA) 60 MG CAPSULE    Take one capsule by mouth once daily for anxiety   FEBUXOSTAT (ULORIC) 40 MG TABLET    Take 1 tablet (40 mg total) by mouth daily.   FERROUS GLUCONATE (FERGON) 324 MG TABLET    Take 1 tablet (324 mg total) by mouth daily with breakfast.   FUROSEMIDE (LASIX) 20 MG TABLET    TAKE 1 TABLET BY MOUTH EVERY DAY AS NEEDED   HYDRALAZINE (APRESOLINE) 50 MG TABLET    TAKE 1 TABLET BY MOUTH 2 TIMES DAILY   HYDROXYZINE (ATARAX/VISTARIL) 10 MG TABLET    Take 1 tablet (10 mg total) by mouth 3 (three) times daily as needed for itching.   INSULIN ASPART (NOVOLOG FLEXPEN) 100 UNIT/ML FLEXPEN    inject DAILY BEFORE MEALS;IF 140-199, give 2 U. IF 200-250, 4 U. IF 251-299, 6 U. IF 300-349, 8 U. IF >350, 10 U. INJ 8 U NOVOLOG+SLIDING   INSULIN GLARGINE (TOUJEO SOLOSTAR) 300 UNIT/ML  SOPN    Inject 40 Units into the skin 2 (two) times daily. (before breakfast and before supper)   ISOSORBIDE MONONITRATE (IMDUR) 60 MG 24 HR TABLET    TAKE 1 TABLET BY MOUTH EVERY DAY   MECLIZINE (ANTIVERT) 25 MG TABLET    TAKE 1 TABLET BY MOUTH THREE TIMES DAILY AS NEEDED FOR DIZZINESS   MEMANTINE (NAMENDA) 5 MG TABLET    Take 2 tablets by mouth in the morning and 1 tablet in the evening  for memory   METOPROLOL SUCCINATE (TOPROL-XL) 25 MG 24 HR TABLET    TAKE 1 TABLET BY MOUTH EVERY DAY FOR blood pressure   NITROGLYCERIN (NITROSTAT) 0.4 MG SL TABLET    Take one tablet under the tongue every 5 minutes as needed for chest pain   OXYCODONE-ACETAMINOPHEN (PERCOCET) 10-325 MG TABLET    Take 1 tablet by mouth every 6 (six) hours.   PANTOPRAZOLE (PROTONIX) 40 MG TABLET    TAKE 1 TABLET BY MOUTH DAILY   PRAVASTATIN (PRAVACHOL) 20 MG TABLET    TAKE 1 TABLET BY MOUTH EVERY DAY FOR cholesterol   RIVAROXABAN (XARELTO) 10 MG TABS TABLET    Take 1 tablet (10 mg total) by mouth daily.  Modified Medications   Modified Medication Previous Medication   ZOSTER VACCINE ADJUVANTED [facility]) INJECTION Zoster Vaccine Adjuvanted [facility]) injection      Inject 0.5 mLs into the muscle once for 1 dose.    Inject 0.5 mLs into the muscle once.  Discontinued Medications   BIOTIN 85027 MCG TABS    Take 1 tablet by mouth daily.   ESTRADIOL (ESTRACE) 0.1 MG/GM VAGINAL CREAM    Use every other evening use in vaginal area    Uses as needed   SODIUM POLYSTYRENE (KAYEXALATE) 15 GM/60ML SUSPENSION    Take 15 g by mouth once.   VITAMIN B-12 (CYANOCOBALAMIN) 1000 MCG TABLET    Take 1,000 mcg by mouth daily.  Physical Exam:  Vitals:   07/17/18 1114  BP: 132/78  Pulse: 88  Temp: 98.6 F (37 C)  TempSrc: Oral  SpO2: 96%  Weight: (!) 323 lb (146.5 kg)  Height: 5\' 7"  (1.702 m)   Body mass index is 50.59 kg/m.  Physical Exam Constitutional:      Appearance: Normal appearance. She is well-developed.  HENT:      Mouth/Throat:     Pharynx: No oropharyngeal exudate.  Eyes:     General: No scleral icterus.    Pupils: Pupils are equal, round, and reactive to light.  Neck:     Musculoskeletal: Neck supple.     Thyroid: No thyromegaly.     Vascular: No carotid bruit.     Trachea: No tracheal deviation.  Cardiovascular:     Rate and Rhythm: Normal rate and regular rhythm.     Pulses: Normal pulses.          Dorsalis pedis pulses are 2+ on the right side and 2+ on the left side.     Heart sounds: Murmur (1/6 SEM) present. No friction rub. No gallop.      Comments: Trace LE edema b/l. No calf TTP Pulmonary:     Effort: Pulmonary effort is normal. No respiratory distress.     Breath sounds: No stridor. No wheezing or rales.  Abdominal:     Palpations: Abdomen is not rigid. There is no hepatomegaly.     Comments: obese  Lymphadenopathy:     Cervical: No cervical adenopathy.  Skin:    General: Skin is warm and dry.     Findings: No rash.  Neurological:     Mental Status: She is alert and oriented to person, place, and time.     Deep Tendon Reflexes: Reflexes are normal and symmetric.  Psychiatric:        Behavior: Behavior normal.        Thought Content: Thought content normal.     Labs reviewed: Basic Metabolic Panel: Recent Labs    10/09/17 0049 10/09/17 0715 10/10/17 0448  10/24/17 1227  06/03/18 1401 06/07/18 1115 06/19/18 1117  NA  --  139 134*   < > 133*   < > 137 138 137  K  --  3.8 4.1   < > 5.4*   < > 5.9* 5.4* 4.6  CL  --  99* 101   < > 98   < > 103 103 104  CO2  --  29 28   < > 27   < > 27 27 26   GLUCOSE  --  214* 273*   < > 211*   < > 167* 134* 197*  BUN  --  22* 25*   < > 37*   < > 39* 37* 44*  CREATININE  --  1.23* 1.47*   < > 1.86*   < > 1.73* 1.72* 1.87*  CALCIUM  --  9.1 8.4*   < > 8.8   < > 9.0 9.3 8.8  MG  --  1.2* 1.5*  --  1.4*  --   --   --   --   PHOS  --  3.7  --   --   --   --   --   --   --   TSH 0.884  --   --   --   --   --   --   --   --    < > =  values in  this interval not displayed.   Liver Function Tests: Recent Labs    10/23/17 1418 11/27/17 1326 03/11/18 1557 05/28/18 1324  AST 13 10* 11 10*  ALT 7 9 8 7   ALKPHOS 85 89  --  80  BILITOT 0.4 0.3 0.2 0.4  PROT 7.8 8.4* 7.3 7.9  ALBUMIN 2.8* 3.0*  --  3.0*   No results for input(s): LIPASE, AMYLASE in the last 8760 hours. No results for input(s): AMMONIA in the last 8760 hours. CBC: Recent Labs    11/27/17 1326 03/11/18 1557 05/28/18 1324  WBC 11.8* 11.7* 10.2  NEUTROABS 8.7* 8,213* 7.1  HGB 9.1* 10.1* 9.4*  HCT 30.3* 30.9* 31.1*  MCV 83.0 88.3 95.7  PLT 303 290 270   Lipid Panel: No results for input(s): CHOL, HDL, LDLCALC, TRIG, CHOLHDL, LDLDIRECT in the last 8760 hours. TSH: Recent Labs    10/09/17 0049  TSH 0.884   A1C: Lab Results  Component Value Date   HGBA1C 10.2 (H) 03/11/2018     Assessment/Plan 1. Chronic pain syndrome -refill provided today; database checked.  - oxyCODONE-acetaminophen (PERCOCET) 10-325 MG tablet; Take 1 tablet by mouth every 6 (six) hours.  Dispense: 120 tablet; Refill: 0  2. Hyperkalemia Limiting potassium intake. Will follow up lab.  - BASIC METABOLIC PANEL WITH GFR  3. CKD (chronic kidney disease) stage 3, GFR 30-59 ml/min (HCC) -stable, following with nephrologist at this time. Encourage proper hydration and to avoid NSAIDS (Aleve, Advil, Motrin, Ibuprofen)  With proper control of DM and HTN.  - BASIC METABOLIC PANEL WITH GFR  4. Allergic rhinitis, unspecified seasonality, unspecified trigger -to start zyrtec or claritin 10 mg by mouth daily  5. Type 2 diabetes mellitus with diabetic neuropathy, with long-term current use of insulin ([place]) Report better evening controls, some AM blood sugars in the 60s.  Does not have readings today due to changing out device. Will get A1c today but likely will need endocrine referral for further management.   6. Serum potassium elevated Follow up BMP today  7. Mixed  hyperlipidemia no recent lab; Continues on Pravachol   Next appt: 3 months, sooner if needed  Amily Depp K. Hooper, [facility] [phone]

## 2018-07-17 NOTE — Patient Instructions (Addendum)
To use zyrtec (Cetirizine) 10 mg by mouth daily for allergies   Continue to work on diet modifications to help with diabetes, may refer to endocrinology pending A1c  Follow up in 3 months for routine follow up

## 2018-07-18 ENCOUNTER — Other Ambulatory Visit: Payer: Self-pay | Admitting: Nurse Practitioner

## 2018-07-18 DIAGNOSIS — E1140 Type 2 diabetes mellitus with diabetic neuropathy, unspecified: Secondary | ICD-10-CM

## 2018-07-18 DIAGNOSIS — Z794 Long term (current) use of insulin: Secondary | ICD-10-CM

## 2018-07-18 DIAGNOSIS — E875 Hyperkalemia: Secondary | ICD-10-CM

## 2018-07-18 LAB — BASIC METABOLIC PANEL WITH GFR
BUN/Creatinine Ratio: 27 (calc) — ABNORMAL HIGH (ref 6–22)
BUN: 50 mg/dL — ABNORMAL HIGH (ref 7–25)
CO2: 25 mmol/L (ref 20–32)
Calcium: 8.8 mg/dL (ref 8.6–10.4)
Chloride: 107 mmol/L (ref 98–110)
Creat: 1.86 mg/dL — ABNORMAL HIGH (ref 0.60–0.88)
GFR, Est African American: 29 mL/min/{1.73_m2} — ABNORMAL LOW (ref >=60)
GFR, Est Non African American: 25 mL/min/{1.73_m2} — ABNORMAL LOW (ref >=60)
Glucose, Bld: 88 mg/dL (ref 65–139)
Potassium: 5.4 mmol/L — ABNORMAL HIGH (ref 3.5–5.3)
Sodium: 139 mmol/L (ref 135–146)

## 2018-07-18 LAB — HEMOGLOBIN A1C
Hgb A1c MFr Bld: 8.3 % of total Hgb — ABNORMAL HIGH (ref <5.7)
Mean Plasma Glucose: 192 (calc)
eAG (mmol/L): 10.6 (calc)

## 2018-07-18 LAB — LIPID PANEL
Cholesterol: 90 mg/dL (ref <200)
HDL: 35 mg/dL — ABNORMAL LOW (ref >=50)
LDL Cholesterol (Calc): 40 mg/dL (calc)
Non-HDL Cholesterol (Calc): 55 mg/dL (calc) (ref <130)
Total CHOL/HDL Ratio: 2.6 (calc) (ref <5.0)
Triglycerides: 74 mg/dL (ref <150)

## 2018-07-18 MED ORDER — SODIUM ZIRCONIUM CYCLOSILICATE 5 G PO PACK: 5.0000 g | PACK | Freq: Every day | ORAL | 1 refills | Status: DC

## 2018-07-19 ENCOUNTER — Other Ambulatory Visit: Payer: Self-pay

## 2018-07-19 ENCOUNTER — Other Ambulatory Visit: Payer: Self-pay | Admitting: *Deleted

## 2018-07-19 DIAGNOSIS — E875 Hyperkalemia: Secondary | ICD-10-CM

## 2018-07-19 NOTE — Patient Outreach (Signed)
[facility]) Care Management  07/19/2018  [name] October 08, 1934 [id number]   [place] [facility] Outreach  Referral Date: 12/11/2017 Referral Source: Transfer from [place] Reason for Referral: Continued Disease Management Education/Chronic Care Improvement Program Insurance: [employer]   Outreach Attempt:  Outreach attempt #1 to patient for follow up. No answer and unable to leave voicemail message due to voicemail did not pick up.  Plan:  RN Health Coach will make another outreach attempt within the month of April.   [place] Coach [phone] [email or web address]

## 2018-07-22 ENCOUNTER — Encounter: Payer: Self-pay | Admitting: Internal Medicine

## 2018-07-22 ENCOUNTER — Telehealth: Payer: Self-pay

## 2018-07-22 NOTE — Telephone Encounter (Signed)
Received a fax from company requesting information from provider on diabetic shoes. Tried calling patient's daughter to see if they had tried to order diabetic shoes for patient. No answer. Will follow up with this at later time.

## 2018-07-29 ENCOUNTER — Other Ambulatory Visit: Payer: Self-pay | Admitting: Nurse Practitioner

## 2018-07-29 MED ORDER — FUROSEMIDE 20 MG PO TABS: 20.0000 mg | ORAL_TABLET | Freq: Every day | ORAL | 6 refills | Status: DC | PRN

## 2018-07-29 NOTE — Telephone Encounter (Signed)
Received fax request for furosemide and electronic request for hydralazine.  LOV 07/17/18.  F/U in 3 months or sooner PRN.  Both refills transmitted to [facility].

## 2018-07-30 ENCOUNTER — Other Ambulatory Visit: Payer: Self-pay | Admitting: Nurse Practitioner

## 2018-07-30 ENCOUNTER — Telehealth: Payer: Self-pay | Admitting: *Deleted

## 2018-07-30 NOTE — Telephone Encounter (Signed)
Thank you :)

## 2018-07-30 NOTE — Telephone Encounter (Signed)
Patient called and stated that she was placed on Lokelma 5g due to High potassium. She stopped it yesterday. Stated she stopped it because it is making her Poop on herself.   I spoke with [name] and she stated that patient needed to come in and have her Potassium checked.  Patient notified and agreed and appointment scheduled for 07/31/2018.

## 2018-07-31 ENCOUNTER — Telehealth: Payer: Self-pay

## 2018-07-31 ENCOUNTER — Other Ambulatory Visit: Payer: Self-pay

## 2018-07-31 ENCOUNTER — Other Ambulatory Visit: Payer: PPO

## 2018-07-31 DIAGNOSIS — E875 Hyperkalemia: Secondary | ICD-10-CM

## 2018-07-31 LAB — BASIC METABOLIC PANEL
BUN/Creatinine Ratio: 23 (calc) — ABNORMAL HIGH (ref 6–22)
BUN: 48 mg/dL — ABNORMAL HIGH (ref 7–25)
CO2: 26 mmol/L (ref 20–32)
Calcium: 8.8 mg/dL (ref 8.6–10.4)
Chloride: 106 mmol/L (ref 98–110)
Creat: 2.05 mg/dL — ABNORMAL HIGH (ref 0.60–0.88)
Glucose, Bld: 117 mg/dL — ABNORMAL HIGH (ref 65–99)
Potassium: 5.9 mmol/L — ABNORMAL HIGH (ref 3.5–5.3)
Sodium: 138 mmol/L (ref 135–146)

## 2018-07-31 NOTE — Telephone Encounter (Signed)
Patient was in office today and brought paper work with her. Paper work for diabetic shoes was handed to [employer] working with [employer]. Provider's are working on paperwork.

## 2018-07-31 NOTE — Telephone Encounter (Signed)
Patient was in office for labs this am and requested that we check the status of her patient assistance delivery  I called [employer] and was informed that application for Toujeo expired and patient does not have a pending shipment. I was then transferred to a different department and left message requesting return call on the status of Novolog.  I resubmitted Toujeo form faxed in November for [employer] (see document scanned under media)   Awaiting return call in reference to [employer]

## 2018-08-01 ENCOUNTER — Other Ambulatory Visit: Payer: Self-pay | Admitting: Nurse Practitioner

## 2018-08-01 DIAGNOSIS — E875 Hyperkalemia: Secondary | ICD-10-CM

## 2018-08-01 MED ORDER — SODIUM POLYSTYRENE SULFONATE 15 GM/60ML PO SUSP: 15.0000 g | Freq: Once | ORAL | 0 refills | Status: AC

## 2018-08-06 ENCOUNTER — Other Ambulatory Visit: Payer: Self-pay

## 2018-08-06 ENCOUNTER — Other Ambulatory Visit: Payer: PPO

## 2018-08-06 DIAGNOSIS — E875 Hyperkalemia: Secondary | ICD-10-CM | POA: Diagnosis not present

## 2018-08-06 LAB — BASIC METABOLIC PANEL
BUN/Creatinine Ratio: 29 (calc) — ABNORMAL HIGH (ref 6–22)
BUN: 48 mg/dL — ABNORMAL HIGH (ref 7–25)
CO2: 26 mmol/L (ref 20–32)
Calcium: 8.7 mg/dL (ref 8.6–10.4)
Chloride: 106 mmol/L (ref 98–110)
Creat: 1.68 mg/dL — ABNORMAL HIGH (ref 0.60–0.88)
Glucose, Bld: 159 mg/dL — ABNORMAL HIGH (ref 65–99)
Potassium: 5.3 mmol/L (ref 3.5–5.3)
Sodium: 137 mmol/L (ref 135–146)

## 2018-08-07 ENCOUNTER — Other Ambulatory Visit: Payer: Self-pay | Admitting: *Deleted

## 2018-08-07 NOTE — Patient Outreach (Signed)
[facility]) Care Management  08/07/2018  [name] Oct 22, 1934 [id number]   RN Health Coach Case Closure  Referral Date: 12/11/2017 Referral Source: Transfer from [place] Reason for Referral: Continued Disease Management Education/Chronic Care Improvement Program Insurance: [employer]   Outreach Attempt:  Successful telephone outreach to patient for case closure and transition to [facility].  HIPAA verified with patient.  Patient and daughter confirm they are enrolled with [place].  RN Health Coach thanked patient and daughter for participating with [facility] and updated on Case Closure.  Plan:  RN Health Coach will close case and make patient inactive with [facility].  RN Health Coach will send primary care provider Case Closure Letter.  RN Health Coach will send patient Case Closure Letter.  [place] Coach [phone] [email or web address]

## 2018-08-09 DIAGNOSIS — I739 Peripheral vascular disease, unspecified: Secondary | ICD-10-CM | POA: Diagnosis not present

## 2018-08-09 DIAGNOSIS — E1351 Other specified diabetes mellitus with diabetic peripheral angiopathy without gangrene: Secondary | ICD-10-CM | POA: Diagnosis not present

## 2018-08-09 DIAGNOSIS — L602 Onychogryphosis: Secondary | ICD-10-CM | POA: Diagnosis not present

## 2018-08-12 NOTE — Telephone Encounter (Signed)
Toujeo received via mail from [employer].   I called patient's daughter [name] and informed her Nelva Nay is available for pick-up. I also advised that I had not heard back about Novolog and they should follow-up. [name] states she has the number and will do so.

## 2018-08-13 ENCOUNTER — Ambulatory Visit: Payer: Self-pay | Admitting: *Deleted

## 2018-08-13 ENCOUNTER — Telehealth: Payer: Self-pay | Admitting: *Deleted

## 2018-08-13 DIAGNOSIS — R42 Dizziness and giddiness: Secondary | ICD-10-CM | POA: Diagnosis not present

## 2018-08-13 DIAGNOSIS — R079 Chest pain, unspecified: Secondary | ICD-10-CM | POA: Diagnosis not present

## 2018-08-13 DIAGNOSIS — R001 Bradycardia, unspecified: Secondary | ICD-10-CM | POA: Diagnosis not present

## 2018-08-13 DIAGNOSIS — R52 Pain, unspecified: Secondary | ICD-10-CM | POA: Diagnosis not present

## 2018-08-13 NOTE — Telephone Encounter (Signed)
Agree, thank you

## 2018-08-13 NOTE — Telephone Encounter (Signed)
Patient called and stated that she was so dizzy she could not hold her held up or walk. She stated she wanted to just double up on her medications.   I asked patient if she called 911 this morning when she called and she stated she did and they came out but she REFUSED to go.  I instructed patient that it was important for her to go.   I got [name], NP on phone with patient and she also advised patient TO GO to the hospital to be evaluated and have blood work done. Patient was in agreeance and stated she would.

## 2018-08-13 NOTE — Telephone Encounter (Signed)
Patient called and stated that she is having Chest pains with Pressure, SOB, Pain running down her arms and Dizziness. Stated onset was last night and has gradually worsened.   I instructed patient to call 911. Daughter was in home with patient and confirmed that they will call 911 for patient to be evaluated.

## 2018-08-14 DIAGNOSIS — E1142 Type 2 diabetes mellitus with diabetic polyneuropathy: Secondary | ICD-10-CM | POA: Diagnosis not present

## 2018-08-14 DIAGNOSIS — N183 Chronic kidney disease, stage 3 (moderate): Secondary | ICD-10-CM | POA: Diagnosis not present

## 2018-08-14 DIAGNOSIS — I878 Other specified disorders of veins: Secondary | ICD-10-CM | POA: Diagnosis not present

## 2018-08-14 DIAGNOSIS — J4550 Severe persistent asthma, uncomplicated: Secondary | ICD-10-CM | POA: Diagnosis not present

## 2018-08-14 DIAGNOSIS — I872 Venous insufficiency (chronic) (peripheral): Secondary | ICD-10-CM | POA: Diagnosis not present

## 2018-08-14 DIAGNOSIS — D649 Anemia, unspecified: Secondary | ICD-10-CM | POA: Diagnosis not present

## 2018-08-14 DIAGNOSIS — N202 Calculus of kidney with calculus of ureter: Secondary | ICD-10-CM | POA: Diagnosis not present

## 2018-08-14 DIAGNOSIS — E538 Deficiency of other specified B group vitamins: Secondary | ICD-10-CM | POA: Diagnosis not present

## 2018-08-14 DIAGNOSIS — D473 Essential (hemorrhagic) thrombocythemia: Secondary | ICD-10-CM | POA: Diagnosis not present

## 2018-08-14 DIAGNOSIS — E1165 Type 2 diabetes mellitus with hyperglycemia: Secondary | ICD-10-CM | POA: Diagnosis not present

## 2018-08-14 DIAGNOSIS — D72829 Elevated white blood cell count, unspecified: Secondary | ICD-10-CM | POA: Diagnosis not present

## 2018-08-19 ENCOUNTER — Ambulatory Visit (INDEPENDENT_AMBULATORY_CARE_PROVIDER_SITE_OTHER): Payer: PPO | Admitting: Internal Medicine

## 2018-08-19 ENCOUNTER — Encounter: Payer: Self-pay | Admitting: Internal Medicine

## 2018-08-19 ENCOUNTER — Other Ambulatory Visit: Payer: Self-pay | Admitting: *Deleted

## 2018-08-19 ENCOUNTER — Other Ambulatory Visit: Payer: Self-pay

## 2018-08-19 DIAGNOSIS — E1122 Type 2 diabetes mellitus with diabetic chronic kidney disease: Secondary | ICD-10-CM

## 2018-08-19 DIAGNOSIS — N183 Chronic kidney disease, stage 3 unspecified: Secondary | ICD-10-CM

## 2018-08-19 DIAGNOSIS — E1140 Type 2 diabetes mellitus with diabetic neuropathy, unspecified: Secondary | ICD-10-CM | POA: Diagnosis not present

## 2018-08-19 DIAGNOSIS — E1121 Type 2 diabetes mellitus with diabetic nephropathy: Secondary | ICD-10-CM | POA: Diagnosis not present

## 2018-08-19 DIAGNOSIS — E1165 Type 2 diabetes mellitus with hyperglycemia: Secondary | ICD-10-CM | POA: Diagnosis not present

## 2018-08-19 DIAGNOSIS — Z794 Long term (current) use of insulin: Secondary | ICD-10-CM

## 2018-08-19 DIAGNOSIS — G894 Chronic pain syndrome: Secondary | ICD-10-CM

## 2018-08-19 DIAGNOSIS — IMO0002 Reserved for concepts with insufficient information to code with codable children: Secondary | ICD-10-CM

## 2018-08-19 MED ORDER — INSULIN ASPART 100 UNIT/ML FLEXPEN: 10.0000 [IU] | PEN_INJECTOR | Freq: Three times a day (TID) | SUBCUTANEOUS | 3 refills | Status: DC

## 2018-08-19 MED ORDER — OXYCODONE-ACETAMINOPHEN 10-325 MG PO TABS: 1.0000 | ORAL_TABLET | Freq: Four times a day (QID) | ORAL | 0 refills | Status: DC

## 2018-08-19 MED ORDER — INSULIN GLARGINE (2 UNIT DIAL) 300 UNIT/ML ~~LOC~~ SOPN: 40.0000 [IU] | PEN_INJECTOR | Freq: Every day | SUBCUTANEOUS | 11 refills | Status: DC

## 2018-08-19 NOTE — Patient Instructions (Addendum)
-   Decrease Toujeo to 40 units daily  - Increase Novolog to 10 units with each meal - Correctional scale (BG -125/ 25)

## 2018-08-19 NOTE — Progress Notes (Signed)
Virtual Visit via Video Note  I connected with [name] on 04/13/20at 1:00 PM  by a video enabled telemedicine application and verified that I am speaking with the correct person using two identifiers.   I discussed the limitations of evaluation and management by telemedicine and the availability of in person appointments. The patient expressed understanding and agreed to proceed.  -Location of the patient : Home  -Location of the provider : Office -The names of all persons participating in the telemedicine service : CMA Lolita Rieger , Concha Norway, daughter [facility], another grandson         Name: [name]  MRN/ DOB: [id number], Sep 13, 1934   Age/ Sex: 84 y.o., female    PCP: [name], NP   Reason for Endocrinology Evaluation: Type 2 Diabetes Mellitus     Date of Initial Endocrinology Visit: 08/19/2018     PATIENT IDENTIFIER: [name] is a 84 y.o. female with a past medical history of T2DM, CKD III, and chronic pain syndrome. The patient presented for initial endocrinology clinic visit on 08/19/2018 for consultative assistance with her diabetes management.    HPI: [name] was    Diagnosed with T2DM > 40 yrs ago  Prior Medications tried/Intolerance: can't recall  Currently checking blood sugars 2 x / day,  before breakfast and  Hypoglycemia episodes : yes            Symptoms:                  Frequency:  Hemoglobin A1c has ranged from 6.7%  in 07/2017, peaking at 12.1% in 2018. Patient required assistance for hypoglycemia: no Patient has required hospitalization within the last 1 year from hyper or hypoglycemia: no  In terms of diet, the patient drinks diet soda but snacks most of the day and eats meals twice a day    HOME DIABETES REGIMEN: Toujeo 40 units BID Novolog 8 units TID QAC   Statin: yes ACE-I/ARB: no    GLUCOSE LOG :  Last night 345 mg/dL  This am 120 mg/dL   DIABETIC COMPLICATIONS: Microvascular complications:   CKD  III, right eye macular degentation , neuropathy   Last eye exam: Completed 2020  Macrovascular complications:   CAD, CHF  Denies: PVD, CVA   PAST HISTORY: Past Medical History:  Past Medical History:  Diagnosis Date  . Allergy    takes Mucinex daily as needed  . Anemia, unspecified   . Anxiety    takes Clonazepam daily as needed  . Arthritis   . CHF (congestive heart failure) (HCC)    takes Furosemide daily  . Coronary artery disease    a. s/p IMI 2004 tx with BMS to RCA;  b. s/p Promus DES to RCA 2/12 c. 06/2014 High risk stress test follow up cath 06/2014 showd patent stent--< med therapy for other mild to moedrate disease   . Coronary atherosclerosis of native coronary artery   . Depression    takes Cymbalta daily  . Diabetes mellitus    insulin daily  . Dysuria   . GERD (gastroesophageal reflux disease)    takes Protonix daily  . Gout, unspecified    takes Colchicine daily  . Headache    occasionally  . History of blood clots about 28yrs ago   in legs-takes Coumadin   . Hyperlipidemia    takes Pravastatin daily  . Hypertension    takes Imdur and Metoprolol daily  . Insomnia   .  Joint pain   . Joint swelling   . Myocardial infarction ([place]) 2004  . Numbness   . Obstructive sleep apnea   . Osteoarthritis   . Osteoarthritis   . Osteoarthrosis, unspecified whether generalized or localized, lower leg   . Pain, chronic   . Polymyalgia rheumatica ([place])   . Pulmonary emboli ([place]) 9/13   felt to need lifelong anticoagulation  . Shortness of breath dyspnea    with exertion and has Albuterol inhaler prn  . Type II or unspecified type diabetes mellitus without mention of complication, uncontrolled   . Urinary incontinence    takes Linzess daily  . Vertigo    hx of;was taking Meclizine if needed    Past Surgical History:  Past Surgical History:  Procedure Laterality Date  . ABDOMINAL HYSTERECTOMY     partial  . APPENDECTOMY    . blood clots/legs and lungs   2013  . BREAST BIOPSY Left 07/22/2014  . BREAST BIOPSY Left 02/10/2013  . BREAST LUMPECTOMY Left 11/05/2014  . BREAST LUMPECTOMY WITH RADIOACTIVE SEED LOCALIZATION Left 11/05/2014   Procedure: LEFT BREAST LUMPECTOMY WITH RADIOACTIVE SEED LOCALIZATION;  Surgeon: [name], MD;  Location: [facility];  Service: General;  Laterality: Left;  . CARDIAC CATHETERIZATION    . COLONOSCOPY    . CORONARY ANGIOPLASTY  2  . ESOPHAGOGASTRODUODENOSCOPY (EGD) WITH PROPOFOL N/A 11/07/2016   Procedure: ESOPHAGOGASTRODUODENOSCOPY (EGD) WITH PROPOFOL;  Surgeon: Gatha Mayer, MD;  Location: [facility];  Service: Endoscopy;  Laterality: N/A;  . EXCISION OF SKIN TAG Right 11/05/2014   Procedure: EXCISION OF RIGHT EYELID SKIN TAG;  Surgeon: [name], MD;  Location: [facility];  Service: General;  Laterality: Right;  . EYE SURGERY Bilateral    cataract   . GASTRIC BYPASS  1977    reversed in 1979, [place] N/A 06/29/2014   Procedure: LEFT HEART CATHETERIZATION WITH CORONARY ANGIOGRAM;  Surgeon: Troy Sine, MD;  Location: [facility] CATH [facility];  Service: Cardiovascular;  Laterality: N/A;  . MI with stent placement  2004      Social History:  reports that she has never smoked. She has never used smokeless tobacco. She reports that she does not drink alcohol or use drugs. Family History:  Family History  Problem Relation Age of Onset  . Breast cancer Mother 13  . Heart disease Mother   . Throat cancer Father   . Hypertension Father   . Arthritis Father   . Diabetes Father   . Arthritis Sister   . Obesity Sister   . Diabetes Sister   . Heart disease Cousin   . Colon cancer Neg Hx   . Stomach cancer Neg Hx   . Esophageal cancer Neg Hx       HOME MEDICATIONS: Allergies as of 08/19/2018      Reactions   Sulfonamide Derivatives Swelling   Mouth swelling   Tramadol Nausea And Vomiting   Aricept [donepezil Hcl]    GI upset/loose stools       Medication List       Accurate as of August 19, 2018 11:37 AM. Always use your most recent med list.        albuterol 108 (90 Base) MCG/ACT inhaler Commonly known as:  PROVENTIL HFA;VENTOLIN HFA Inhale 1-2 puffs into the lungs every 6 (six) hours as needed for wheezing or shortness of breath.   anastrozole 1 MG tablet Commonly known as:  ARIMIDEX TAKE 1 TABLET BY  MOUTH EVERY DAY   budesonide-formoterol 160-4.5 MCG/ACT inhaler Commonly known as:  SYMBICORT Inhale 2 puffs into the lungs 2 (two) times daily. For bronchitis   clonazePAM 1 MG tablet Commonly known as:  KLONOPIN TAKE 2 TABLETS BY MOUTH AT BEDTIME AS NEEDED FOR ANXIETY   DULoxetine 60 MG capsule Commonly known as:  CYMBALTA Take one capsule by mouth once daily for anxiety   febuxostat 40 MG tablet Commonly known as:  Uloric Take 1 tablet (40 mg total) by mouth daily.   ferrous gluconate 324 MG tablet Commonly known as:  FERGON Take 1 tablet (324 mg total) by mouth daily with breakfast.   FreeStyle Libre 14 Day Reader [name] 1 Device by Does not apply route 4 (four) times daily -  before meals and at bedtime. For diabetes   FreeStyle Libre 14 Day Sensor Misc 1 Units by Does not apply route 4 (four) times daily -  before meals and at bedtime. For diabetes   furosemide 20 MG tablet Commonly known as:  LASIX Take 1 tablet (20 mg total) by mouth daily as needed.   hydrALAZINE 50 MG tablet Commonly known as:  APRESOLINE TAKE 1 TABLET BY MOUTH 2 TIMES DAILY   hydrOXYzine 10 MG tablet Commonly known as:  ATARAX/VISTARIL TAKE 1 TABLET BY MOUTH 3 TIMES DAILY AS NEEDED FOR ITCHING   insulin aspart 100 UNIT/ML FlexPen Commonly known as:  NovoLOG FlexPen inject DAILY BEFORE MEALS;IF 140-199, give 2 U. IF 200-250, 4 U. IF 251-299, 6 U. IF 300-349, 8 U. IF >350, 10 U. INJ 8 U NOVOLOG+SLIDING   Insulin Glargine 300 UNIT/ML Sopn Commonly known as:  Toujeo SoloStar Inject 40 Units into the skin 2 (two) times daily.  (before breakfast and before supper)   isosorbide mononitrate 60 MG 24 hr tablet Commonly known as:  IMDUR TAKE 1 TABLET BY MOUTH EVERY DAY   meclizine 25 MG tablet Commonly known as:  ANTIVERT TAKE 1 TABLET BY MOUTH THREE TIMES DAILY AS NEEDED FOR DIZZINESS   memantine 5 MG tablet Commonly known as:  NAMENDA Take 2 tablets by mouth in the morning and 1 tablet in the evening  for memory   metoprolol succinate 25 MG 24 hr tablet Commonly known as:  TOPROL-XL TAKE 1 TABLET BY MOUTH EVERY DAY FOR blood pressure   nitroGLYCERIN 0.4 MG SL tablet Commonly known as:  Nitrostat Take one tablet under the tongue every 5 minutes as needed for chest pain   oxyCODONE-acetaminophen 10-325 MG tablet Commonly known as:  PERCOCET Take 1 tablet by mouth every 6 (six) hours.   pantoprazole 40 MG tablet Commonly known as:  PROTONIX TAKE 1 TABLET BY MOUTH DAILY   pravastatin 20 MG tablet Commonly known as:  PRAVACHOL TAKE 1 TABLET BY MOUTH EVERY DAY FOR cholesterol   rivaroxaban 10 MG Tabs tablet Commonly known as:  Xarelto Take 1 tablet (10 mg total) by mouth daily.   Vitamin D3 125 MCG (5000 UT) Caps Take 1 capsule by mouth daily.        ALLERGIES: Allergies  Allergen Reactions  . Sulfonamide Derivatives Swelling    Mouth swelling  . Tramadol Nausea And Vomiting  . Aricept [Donepezil Hcl]     GI upset/loose stools     REVIEW OF SYSTEMS: A comprehensive ROS was conducted with the patient and is negative except as per HPI and below:  Review of Systems  Constitutional: Positive for malaise/fatigue. Negative for weight loss.  HENT: Negative for congestion and sore  throat.   Eyes: Positive for blurred vision. Negative for pain.  Respiratory: Negative for cough and shortness of breath.   Cardiovascular: Negative for chest pain and palpitations.  Gastrointestinal: Negative for diarrhea and nausea.  Genitourinary: Positive for frequency.  Neurological: Positive for tingling.  Negative for tremors.  Endo/Heme/Allergies: Negative for polydipsia.  Psychiatric/Behavioral: Positive for memory loss.        DATA REVIEWED:  Lab Results  Component Value Date   HGBA1C 8.3 (H) 07/17/2018   HGBA1C 10.2 (H) 03/11/2018   HGBA1C 9.8 (H) 10/09/2017   Lab Results  Component Value Date   MICROALBUR 2.9 06/14/2017   LDLCALC 40 07/17/2018   CREATININE 1.68 (H) 08/06/2018   Lab Results  Component Value Date   MICRALBCREAT 81 (H) 06/14/2017    Lab Results  Component Value Date   CHOL 90 07/17/2018   HDL 35 (L) 07/17/2018   LDLCALC 40 07/17/2018   TRIG 74 07/17/2018   CHOLHDL 2.6 07/17/2018        ASSESSMENT / PLAN / RECOMMENDATIONS:   1) Type 2 Diabetes Mellitus, Sub-Optimally controlled, With CKD III and macrovascular complications - Most recent A1c of 8.3 %. Goal A1c < 7.5 %.    Plan: GENERAL:  Poorly controlled diabetes due to improper insulin use and continued dietary indiscretions. I have explained to her that snacking all day long and not eating consistent meals in a day, will make it impossible to reach an optimal glucose target.   Her seemingly sub-optimal A1c of 8.3% is due to fluctuating BG's between hypoglycemia and post-prandial hyperglycemia  We discussed the difference between long/short acting insulin and the purpose of each, she is currently on too much basal   I emphasized the importance of taking novolog before eating and not to take it after a meal or if she is skipping a meal due to risk of hyoglycemia  We discussed the priority at this time is to eliminate hypoglycemia followed by controlled hyperglycemia.   We also briefly discussed the importance of optimal glucose control is slowing down the progression of microvascular complications.   MEDICATIONS:  Decrease Toujeo to 40 units daily   Increase Novolog to 10 units TID   CF: BG -125/25 with meals  EDUCATION / INSTRUCTIONS:  BG monitoring instructions: Patient is instructed  to check her blood sugars 4 times a day, before meals and bedtime through freestyle libre.  Call [facility] if: BG persistently < 70 or > 300. . I reviewed the Rule of 15 for the treatment of hypoglycemia in detail with the patient. Literature supplied.   2) Diabetic complications:   Eye: Does not have known diabetic retinopathy.   Neuro/ Feet: Does have known diabetic peripheral neuropathy.  Renal: Patient does  have known baseline CKD. She is not on an ACEI/ARB at present.   3) Lipids: Patient is on a statin.    4) Hypertension: Historically she has been  at goal of < 140/90 mmHg.    F/u in 3 weeks    Signed electronically by: Mack Guise, MD  [facility]  [facility] [address] [place],  [address] Phone: [phone] FAX: [phone]   CC: [name], NP [place] [address] Phone: [phone]  Fax: [phone]    Return to Endocrinology clinic as below: Future Appointments  Date Time Provider [place]  08/19/2018  1:00 PM Shamleffer, [name], MD [facility] None  10/17/2018 11:00 AM Dewaine Oats,  Carlos American, NP [facility] None  11/04/2018 11:40 AM [facility] DIAG TOMO 1 [facility] GI-BREAST CE  11/12/2018 11:30 AM [facility] LAB 4 [facility] None  11/12/2018 12:00 PM [name], MD [facility] None  04/14/2019  1:30 PM [name], NP [facility] None

## 2018-08-19 NOTE — Telephone Encounter (Signed)
Patient requested refill. [place] Verified LR: 07/18/2018 Pended Rx and sent to [facility] for approval.

## 2018-08-20 ENCOUNTER — Other Ambulatory Visit: Payer: Self-pay | Admitting: Internal Medicine

## 2018-08-20 MED ORDER — INSULIN DEGLUDEC 100 UNIT/ML ~~LOC~~ SOPN: 40.0000 [IU] | PEN_INJECTOR | Freq: Every day | SUBCUTANEOUS | 11 refills | Status: DC

## 2018-08-21 ENCOUNTER — Other Ambulatory Visit: Payer: Self-pay

## 2018-08-21 MED ORDER — INSULIN GLARGINE (2 UNIT DIAL) 300 UNIT/ML ~~LOC~~ SOPN: 40.0000 [IU] | PEN_INJECTOR | Freq: Every day | SUBCUTANEOUS | 11 refills | Status: DC

## 2018-08-26 ENCOUNTER — Other Ambulatory Visit: Payer: Self-pay | Admitting: Nurse Practitioner

## 2018-08-26 DIAGNOSIS — E875 Hyperkalemia: Secondary | ICD-10-CM

## 2018-08-26 MED ORDER — FEBUXOSTAT 40 MG PO TABS: ORAL_TABLET | ORAL | 0 refills | Status: DC

## 2018-08-26 NOTE — Telephone Encounter (Signed)
[facility]

## 2018-08-27 ENCOUNTER — Other Ambulatory Visit: Payer: Self-pay

## 2018-08-27 ENCOUNTER — Emergency Department (HOSPITAL_COMMUNITY): Payer: PPO

## 2018-08-27 ENCOUNTER — Telehealth: Payer: Self-pay | Admitting: *Deleted

## 2018-08-27 ENCOUNTER — Emergency Department (HOSPITAL_COMMUNITY)
Admission: EM | Admit: 2018-08-27 | Discharge: 2018-08-27 | Disposition: A | Discharge status: Home / Self Care | Payer: PPO | Attending: Emergency Medicine | Admitting: Emergency Medicine

## 2018-08-27 ENCOUNTER — Encounter (HOSPITAL_COMMUNITY): Payer: Self-pay

## 2018-08-27 DIAGNOSIS — Z7901 Long term (current) use of anticoagulants: Secondary | ICD-10-CM | POA: Insufficient documentation

## 2018-08-27 DIAGNOSIS — Z794 Long term (current) use of insulin: Secondary | ICD-10-CM | POA: Insufficient documentation

## 2018-08-27 DIAGNOSIS — I2510 Atherosclerotic heart disease of native coronary artery without angina pectoris: Secondary | ICD-10-CM | POA: Insufficient documentation

## 2018-08-27 DIAGNOSIS — Z86711 Personal history of pulmonary embolism: Secondary | ICD-10-CM | POA: Diagnosis not present

## 2018-08-27 DIAGNOSIS — E119 Type 2 diabetes mellitus without complications: Secondary | ICD-10-CM | POA: Insufficient documentation

## 2018-08-27 DIAGNOSIS — Z853 Personal history of malignant neoplasm of breast: Secondary | ICD-10-CM | POA: Insufficient documentation

## 2018-08-27 DIAGNOSIS — J449 Chronic obstructive pulmonary disease, unspecified: Secondary | ICD-10-CM | POA: Diagnosis not present

## 2018-08-27 DIAGNOSIS — R0602 Shortness of breath: Secondary | ICD-10-CM | POA: Diagnosis not present

## 2018-08-27 DIAGNOSIS — R42 Dizziness and giddiness: Secondary | ICD-10-CM

## 2018-08-27 DIAGNOSIS — G44219 Episodic tension-type headache, not intractable: Secondary | ICD-10-CM | POA: Insufficient documentation

## 2018-08-27 DIAGNOSIS — R52 Pain, unspecified: Secondary | ICD-10-CM | POA: Diagnosis not present

## 2018-08-27 DIAGNOSIS — R109 Unspecified abdominal pain: Secondary | ICD-10-CM | POA: Diagnosis not present

## 2018-08-27 DIAGNOSIS — I10 Essential (primary) hypertension: Secondary | ICD-10-CM | POA: Insufficient documentation

## 2018-08-27 DIAGNOSIS — Z209 Contact with and (suspected) exposure to unspecified communicable disease: Secondary | ICD-10-CM | POA: Diagnosis not present

## 2018-08-27 DIAGNOSIS — R51 Headache: Secondary | ICD-10-CM | POA: Diagnosis not present

## 2018-08-27 DIAGNOSIS — I509 Heart failure, unspecified: Secondary | ICD-10-CM | POA: Diagnosis not present

## 2018-08-27 DIAGNOSIS — E1165 Type 2 diabetes mellitus with hyperglycemia: Secondary | ICD-10-CM | POA: Diagnosis not present

## 2018-08-27 DIAGNOSIS — Z79899 Other long term (current) drug therapy: Secondary | ICD-10-CM | POA: Insufficient documentation

## 2018-08-27 LAB — COMPREHENSIVE METABOLIC PANEL
ALT: 14 U/L (ref 0–44)
AST: 16 U/L (ref 15–41)
Albumin: 2.9 g/dL — ABNORMAL LOW (ref 3.5–5.0)
Alkaline Phosphatase: 70 U/L (ref 38–126)
Anion gap: 11 (ref 5–15)
BUN: 44 mg/dL — ABNORMAL HIGH (ref 8–23)
CO2: 20 mmol/L — ABNORMAL LOW (ref 22–32)
Calcium: 9.1 mg/dL (ref 8.9–10.3)
Chloride: 105 mmol/L (ref 98–111)
Creatinine, Ser: 1.65 mg/dL — ABNORMAL HIGH (ref 0.44–1.00)
GFR calc Af Amer: 33 mL/min — ABNORMAL LOW (ref >60)
GFR calc non Af Amer: 29 mL/min — ABNORMAL LOW (ref >60)
Glucose, Bld: 267 mg/dL — ABNORMAL HIGH (ref 70–99)
Potassium: 5.1 mmol/L (ref 3.5–5.1)
Sodium: 136 mmol/L (ref 135–145)
Total Bilirubin: 0.8 mg/dL (ref 0.3–1.2)
Total Protein: 7.9 g/dL (ref 6.5–8.1)

## 2018-08-27 LAB — CBC WITH DIFFERENTIAL/PLATELET
Abs Immature Granulocytes: 0.07 10*3/uL (ref 0.00–0.07)
Basophils Absolute: 0 10*3/uL (ref 0.0–0.1)
Basophils Relative: 0 %
Eosinophils Absolute: 0.2 10*3/uL (ref 0.0–0.5)
Eosinophils Relative: 2 %
HCT: 33.4 % — ABNORMAL LOW (ref 36.0–46.0)
Hemoglobin: 10.3 g/dL — ABNORMAL LOW (ref 12.0–15.0)
Immature Granulocytes: 1 %
Lymphocytes Relative: 18 %
Lymphs Abs: 2.4 10*3/uL (ref 0.7–4.0)
MCH: 29.4 pg (ref 26.0–34.0)
MCHC: 30.8 g/dL (ref 30.0–36.0)
MCV: 95.4 fL (ref 80.0–100.0)
Monocytes Absolute: 0.7 10*3/uL (ref 0.1–1.0)
Monocytes Relative: 5 %
Neutro Abs: 10 10*3/uL — ABNORMAL HIGH (ref 1.7–7.7)
Neutrophils Relative %: 74 %
Platelets: 281 10*3/uL (ref 150–400)
RBC: 3.5 MIL/uL — ABNORMAL LOW (ref 3.87–5.11)
RDW: 13.6 % (ref 11.5–15.5)
WBC: 13.3 10*3/uL — ABNORMAL HIGH (ref 4.0–10.5)
nRBC: 0 % (ref 0.0–0.2)

## 2018-08-27 LAB — URINALYSIS, ROUTINE W REFLEX MICROSCOPIC
Bilirubin Urine: NEGATIVE
Glucose, UA: NEGATIVE mg/dL
Hgb urine dipstick: NEGATIVE
Ketones, ur: NEGATIVE mg/dL
Nitrite: NEGATIVE
Protein, ur: NEGATIVE mg/dL
Specific Gravity, Urine: 1.01 (ref 1.005–1.030)
pH: 5 (ref 5.0–8.0)

## 2018-08-27 LAB — BRAIN NATRIURETIC PEPTIDE: B Natriuretic Peptide: 57.4 pg/mL (ref 0.0–100.0)

## 2018-08-27 LAB — LIPASE, BLOOD: Lipase: 39 U/L (ref 11–51)

## 2018-08-27 LAB — TROPONIN I: Troponin I: <0.03 ng/mL (ref <0.03)

## 2018-08-27 MED ORDER — ACETAMINOPHEN 325 MG PO TABS
650.0000 mg | ORAL_TABLET | Freq: Once | ORAL | Status: AC
  Administered 2018-08-27: 650 mg via ORAL
  Filled 2018-08-27: qty 2

## 2018-08-27 MED ORDER — DOXYCYCLINE HYCLATE 100 MG PO CAPS: 100.0000 mg | ORAL_CAPSULE | Freq: Two times a day (BID) | ORAL | 0 refills | Status: AC

## 2018-08-27 MED ORDER — FUROSEMIDE 10 MG/ML IJ SOLN
20.0000 mg | Freq: Once | INTRAMUSCULAR | Status: AC
  Administered 2018-08-27: 20 mg via INTRAVENOUS
  Filled 2018-08-27: qty 2

## 2018-08-27 NOTE — Discharge Instructions (Signed)
You have been diagnosed today with shortness of breath and vertigo.  At this time there does not appear to be the presence of an emergent medical condition, however there is always the potential for conditions to change. Please read and follow the below instructions.  Please return to the Emergency Department immediately for any new or worsening symptoms or if your symptoms do not improve within 2 days. Please be sure to follow up with your Primary Care Provider within one week regarding your visit today; please call their office to schedule an appointment even if you are feeling better for a follow-up visit. Please take the antibiotic doxycycline as prescribed, 100 mg twice daily for the next 7 days to treat your suspected pneumonia. Additionally please increase your dose of Lasix to 40 mg (two 20mg  tablets) daily for the next 2 days before returning to your normal dose of Lasix on the third day. We suspect that your intermittent dizziness is related to your vertigo, you may continue to use your meclizine as previously prescribed to help with this and please follow-up with your primary care provider this week for reevaluation of your dizziness along with your shortness of breath.  Get help right away if: Your shortness of breath gets worse. You have trouble breathing when you are resting. You feel light-headed or you pass out (faint). You have a cough that is not helped by medicines. You cough up blood. You have pain with breathing. You have pain in your chest, arms, shoulders, or belly (abdomen). You have a fever. You cannot walk up stairs. You cannot exercise the way you normally do. Get help right away if: You throw up (vomit) or have watery poop (diarrhea), and you cannot eat or drink anything. You have trouble: Talking. Walking. Swallowing. Using your arms, hands, or legs. You feel generally weak. You are not thinking clearly, or you have trouble forming sentences. A friend or  family member may notice this. You have: Chest pain. Pain in your belly (abdomen). Shortness of breath. Sweating. Your vision changes. You are bleeding. You have a very bad headache. You have neck pain or a stiff neck. You have a fever.  Please read the additional information packets attached to your discharge summary.  Do not take your medicine if  develop an itchy rash, swelling in your mouth or lips, or difficulty breathing.

## 2018-08-27 NOTE — ED Notes (Signed)
Patient verbalizes understanding of discharge instructions. Opportunity for questioning and answers were provided. 

## 2018-08-27 NOTE — ED Provider Notes (Signed)
Care handoff received from [facility] PA-C at shift change please see her note for full details of the visit.  In short patient with shortness of breath for greater than 1 week.  Rales of the right lung base.  Evaluation by previous team with concern for occult pneumonia and mild CHF exacerbation.  Plan at shift change is to discharge patient with double dose of Lasix x2 days and doxycycline 100 mg twice daily x7 days.  Prior to discharge patient endorses intermittent dizziness that has been present for multiple weeks, evaluated by previous team and felt to be peripheral in origin however CT head ordered, pending normal plan is to discharge patient with PCP follow-up.  Patient was seen and evaluated by Dr. Ralene Bathe during visit who agrees with discharge after CT head if no actionable findings. Physical Exam  BP (!) 135/91   Pulse 86   Temp 98 F (36.7 C) (Oral)   Resp 16   SpO2 100%   Physical Exam Constitutional:      General: She is not in acute distress.    Appearance: She is well-developed. She is obese. She is not ill-appearing or diaphoretic.  HENT:     Head: Normocephalic and atraumatic.     Mouth/Throat:     Mouth: Mucous membranes are moist.     Pharynx: Oropharynx is clear.  Eyes:     Extraocular Movements: Extraocular movements intact.     Pupils: Pupils are equal, round, and reactive to light.  Neck:     Musculoskeletal: Normal range of motion and neck supple.  Cardiovascular:     Rate and Rhythm: Normal rate and regular rhythm.     Heart sounds: Normal heart sounds.  Pulmonary:     Effort: Pulmonary effort is normal. No tachypnea, accessory muscle usage or respiratory distress.     Breath sounds: Normal air entry.  Chest:     Chest wall: No tenderness.  Abdominal:     General: Bowel sounds are normal.     Palpations: Abdomen is soft.     Tenderness: There is no abdominal tenderness. There is no guarding or rebound.  Musculoskeletal:     Comments: Moves  extremities spontaneously  Feet:     Right foot:     Protective Sensation: 5 sites tested. 5 sites sensed.     Left foot:     Protective Sensation: 5 sites tested. 5 sites sensed.  Neurological:     Mental Status: She is alert and oriented to person, place, and time.     GCS: GCS eye subscore is 4. GCS verbal subscore is 5. GCS motor subscore is 6.     Comments: Speech is clear and goal oriented, follows commands Major Cranial nerves without deficit, no facial droop Normal strength in upper and lower extremities bilaterally including dorsiflexion and plantar flexion, strong and equal grip strength Sensation normal to light touch Moves extremities without ataxia, coordination intact Normal finger to nose and rapid alternating movements No pronator drift Unable to assess gait secondary to body habitus, patient states that she normally moves using a wheelchair.    ED Course/Procedures   Clinical Course as of Aug 27 1810  Tue Aug 27, 2018  1615 Multi weeks SOB and sputum  Plan: Increase lasix x 2 days, doxy for clinical dx pneumonia.  ------------------------------------- Complaint of dizzy x weeks just prior to discharge  Plan to CT head and d/c.   [BM]    Clinical Course User Index [BM] Deliah Boston,  PA-C    Procedures  MDM  BNP within normal limits Troponin negative Lipase normal limits CMP with elevated glucose, BUN and creatinine, appears baseline CBC with leukocytosis of 13.3 Urinalysis with trace leukocytes, few bacteria, patient without symptoms doubt UTI at this time will send culture. EKG without acute findings reviewed with Dr. Rex Kras Chest x-ray:    IMPRESSION:  No edema or consolidation. Stable cardiac silhouette.   CT head:    IMPRESSION:  No acute intracranial pathology. Small-vessel white matter disease.  ------------------------------ Patient updated on results and plan of care, she states understanding and is agreeable for discharge.  On  my evaluation patient is sleeping comfortably easily arousable to voice, fully alert and oriented and without neuro deficit.  She denies dizziness on my evaluation and states that it only occurs with certain movements and she is not having dizziness now when I have her sit up.  She states understanding of care plan of doxycycline twice daily x7 days, increasing her Lasix to 40 mg daily x2 days then returning to her normal Lasix dose and PCP follow-up this week.  Patient also reports that she has meclizine at home that she uses for her vertigo and does not need refill, she states understanding to follow-up with PCP regarding her vertigo as well.  At this time there does not appear to be any evidence of an acute emergency medical condition and the patient appears stable for discharge with appropriate outpatient follow up. Diagnosis was discussed with patient who verbalizes understanding of care plan and is agreeable to discharge. I have discussed return precautions with patient who verbalizes understanding of return precautions. Patient strongly encouraged to follow-up with their PCP. All questions answered. Patient has been discharged in good condition.  Patient's case discussed with Dr. Rex Kras who agrees with plan to discharge with follow-up.   Note: Portions of this report may have been transcribed using voice recognition software. Every effort was made to ensure accuracy; however, inadvertent computerized transcription errors may still be present.    Gari Crown 08/27/18 1916    Rex Kras Wenda Overland, MD 08/27/18 [phone]

## 2018-08-27 NOTE — Telephone Encounter (Signed)
Noted, thank you

## 2018-08-27 NOTE — Telephone Encounter (Signed)
[name], daughter called and stated that patient is having severe chest pains, stomach pains, not eating and up all night last night.   Instructed them that patient needs to go to the Hospital. Daughter agreed and stated that she will get her there.

## 2018-08-27 NOTE — ED Notes (Signed)
Daughter, [name], would like an update at [phone], says pt called her stating her head hurts and wants someone to give her something

## 2018-08-27 NOTE — ED Provider Notes (Signed)
[facility] Provider Note   CSN: [id number] Arrival date & time: 08/27/18  1314    History   Chief Complaint Chief Complaint  Patient presents with  . Shortness of Breath    HPI [name] is a 84 y.o. female with history of CAD, CHF, diabetes, hypertension, PE anticoagulated on Xarelto who presents with a one-week history of shortness of breath.  Patient denies any fever, cough.  She has had some intermittent abdominal pain that is crampy and "bubbling."  Patient has not had any nausea, vomiting, diarrhea, urinary symptoms.  Patient reports she took Lasix to see if this would help her shortness of breath, however she reports she seemed to have urinated the last.  She denies any extremity edema.  Close to discharge, patient began complaining about a headache and dizziness that has been present intermittently for the past few weeks.  She has been taking meclizine at home without relief.  She has had a headache that starts in her shoulders and radiates up and around her forehead.  She has dizziness that she describes as things moving when she moves her head.  The moving stops when she stops moving her head or body.     HPI  Past Medical History:  Diagnosis Date  . Allergy    takes Mucinex daily as needed  . Anemia, unspecified   . Anxiety    takes Clonazepam daily as needed  . Arthritis   . CHF (congestive heart failure) (HCC)    takes Furosemide daily  . Coronary artery disease    a. s/p IMI 2004 tx with BMS to RCA;  b. s/p Promus DES to RCA 2/12 c. 06/2014 High risk stress test follow up cath 06/2014 showd patent stent--< med therapy for other mild to moedrate disease   . Coronary atherosclerosis of native coronary artery   . Depression    takes Cymbalta daily  . Diabetes mellitus    insulin daily  . Dysuria   . GERD (gastroesophageal reflux disease)    takes Protonix daily  . Gout, unspecified    takes Colchicine daily  .  Headache    occasionally  . History of blood clots about 39yrs ago   in legs-takes Coumadin   . Hyperlipidemia    takes Pravastatin daily  . Hypertension    takes Imdur and Metoprolol daily  . Insomnia   . Joint pain   . Joint swelling   . Myocardial infarction ([place]) 2004  . Numbness   . Obstructive sleep apnea   . Osteoarthritis   . Osteoarthritis   . Osteoarthrosis, unspecified whether generalized or localized, lower leg   . Pain, chronic   . Polymyalgia rheumatica ([place])   . Pulmonary emboli ([place]) 9/13   felt to need lifelong anticoagulation  . Shortness of breath dyspnea    with exertion and has Albuterol inhaler prn  . Type II or unspecified type diabetes mellitus without mention of complication, uncontrolled   . Urinary incontinence    takes Linzess daily  . Vertigo    hx of;was taking Meclizine if needed    Patient Active Problem List   Diagnosis Date Noted  . Type 2 diabetes mellitus with diabetic neuropathy, with long-term current use of insulin ([place]) 12/13/2017  . Acute on chronic diastolic CHF (congestive heart failure) ([place]) 10/08/2017  . CHF (congestive heart failure) ([place]) 10/08/2017  . Hyperkalemia 07/26/2017  . Anxiety associated with depression 06/08/2017  . GERD with esophagitis  11/15/2016  . Memory loss 11/15/2016  . Multiple benign nevi 11/15/2016  . Esophageal dysphagia   . Severe episode of recurrent major depressive disorder, without psychotic features ([place]) 06/23/2016  . Endometrial polyp 06/23/2016  . Vaginitis and vulvovaginitis 06/23/2016  . Cough variant asthma 11/17/2015  . Vitamin D deficiency 08/02/2015  . Breast cancer ([place]) 04/26/2015  . Dyslipidemia associated with type 2 diabetes mellitus ([place]) 02/06/2015  . Uncontrolled type 2 diabetes mellitus with diabetic nephropathy, with long-term current use of insulin ([place]) 02/06/2015  . Chronic pain syndrome 02/06/2015  . Morbid obesity with BMI of 50.0-59.9, adult ([place]) 12/18/2014  . CKD  (chronic kidney disease) stage 3, GFR 30-59 ml/min (HCC) 12/18/2014  . Gout 10/27/2014  . Breast mass 10/27/2014  . Encounter for therapeutic drug monitoring 10/08/2014  . Vertigo 09/24/2014  . Chronic bronchitis ([place]) 09/23/2014  . Preoperative clearance 08/16/2014  . Abnormal stress test   . DOE (dyspnea on exertion) 06/24/2014  . CN (constipation) 05/10/2014  . History of pulmonary embolus (PE) 04/22/2014  . Sepsis secondary to UTI ([place]) 04/21/2014  . COPD (chronic obstructive pulmonary disease) ([place]) 04/21/2014  . Estrogen deficiency 02/19/2014  . Breast cancer screening 02/19/2014  . Weight gain 02/19/2014  . Pain in the chest 12/31/2013  . Type 2 diabetes mellitus with diabetic foot deformity ([place]) 07/29/2013  . OSA (obstructive sleep apnea) 02/24/2013  . Need for prophylactic vaccination and inoculation against influenza 01/08/2013  . Insomnia 09/17/2012  . Debility 08/01/2012  . Osteoarthritis   . Dysphagia, unspecified(787.20) 07/31/2012  . GERD (gastroesophageal reflux disease) 07/31/2012  . Long term current use of anticoagulant therapy 02/02/2012  . Chronic diastolic congestive heart failure ([place]) 01/29/2012  . History of deep venous thrombosis 01/27/2012  . Abdominal pain 01/26/2012  . CHOLELITHIASIS 06/06/2010  . Situational depression 06/04/2009  . ORTHOPNEA 03/09/2009  . Morbid (severe) obesity due to excess calories ([place]) 02/15/2009  . Microcytic anemia 02/15/2009  . POLYMYALGIA RHEUMATICA 01/18/2009  . TENSION HEADACHE 01/07/2008  . Headache 01/07/2008  . FUNGAL DERMATITIS 11/13/2007  . CAD (coronary artery disease) 08/29/2007  . URINARY INCONTINENCE 08/29/2007  . Hyperlipidemia 12/03/2006  . Essential hypertension 12/03/2006  . DIVERTICULITIS, HX OF 12/03/2006    Past Surgical History:  Procedure Laterality Date  . ABDOMINAL HYSTERECTOMY     partial  . APPENDECTOMY    . blood clots/legs and lungs  2013  . BREAST BIOPSY Left 07/22/2014  . BREAST  BIOPSY Left 02/10/2013  . BREAST LUMPECTOMY Left 11/05/2014  . BREAST LUMPECTOMY WITH RADIOACTIVE SEED LOCALIZATION Left 11/05/2014   Procedure: LEFT BREAST LUMPECTOMY WITH RADIOACTIVE SEED LOCALIZATION;  Surgeon: [name], MD;  Location: [facility];  Service: General;  Laterality: Left;  . CARDIAC CATHETERIZATION    . COLONOSCOPY    . CORONARY ANGIOPLASTY  2  . ESOPHAGOGASTRODUODENOSCOPY (EGD) WITH PROPOFOL N/A 11/07/2016   Procedure: ESOPHAGOGASTRODUODENOSCOPY (EGD) WITH PROPOFOL;  Surgeon: Gatha Mayer, MD;  Location: [facility];  Service: Endoscopy;  Laterality: N/A;  . EXCISION OF SKIN TAG Right 11/05/2014   Procedure: EXCISION OF RIGHT EYELID SKIN TAG;  Surgeon: [name], MD;  Location: [facility];  Service: General;  Laterality: Right;  . EYE SURGERY Bilateral    cataract   . GASTRIC BYPASS  1977    reversed in 1979, [place] N/A 06/29/2014   Procedure: LEFT HEART CATHETERIZATION WITH CORONARY ANGIOGRAM;  Surgeon: Troy Sine, MD;  Location: [facility];  Service: Cardiovascular;  Laterality: N/A;  . MI with stent placement  2004     OB History   No obstetric history on file.      Home Medications    Prior to Admission medications   Medication Sig Start Date End Date Taking? Authorizing Provider  albuterol (PROVENTIL HFA;VENTOLIN HFA) 108 (90 Base) MCG/ACT inhaler Inhale 1-2 puffs into the lungs every 6 (six) hours as needed for wheezing or shortness of breath. 03/15/16  Yes Robyn Haber, MD  anastrozole (ARIMIDEX) 1 MG tablet TAKE 1 TABLET BY MOUTH EVERY DAY Patient taking differently: Take 1 mg by mouth daily.  03/07/18  Yes [name], MD  budesonide-formoterol [facility]) 160-4.5 MCG/ACT inhaler Inhale 2 puffs into the lungs 2 (two) times daily. For bronchitis 10/10/17  Yes Rai, Ripudeep K, MD  DULoxetine (CYMBALTA) 60 MG capsule Take one capsule by mouth once daily for anxiety Patient taking  differently: Take 60 mg by mouth daily.  03/06/18  Yes Eulas Post, Monica, DO  febuxostat (ULORIC) 40 MG tablet Take 1 tablet (40 mg total) by mouth daily. 08/26/18  Yes [name], NP  ferrous gluconate (FERGON) 324 MG tablet Take 1 tablet (324 mg total) by mouth daily with breakfast. 10/30/17  Yes Burtis Junes, NP  furosemide (LASIX) 20 MG tablet Take 1 tablet (20 mg total) by mouth daily as needed. Patient taking differently: Take 20 mg by mouth daily as needed for fluid.  07/29/18  Yes [name], NP  hydrALAZINE (APRESOLINE) 50 MG tablet TAKE 1 TABLET BY MOUTH 2 TIMES DAILY Patient taking differently: Take 50 mg by mouth 2 (two) times a day.  07/29/18  Yes [name], NP  hydrOXYzine (ATARAX/VISTARIL) 10 MG tablet TAKE 1 TABLET BY MOUTH 3 TIMES DAILY AS NEEDED FOR ITCHING Patient taking differently: Take 10 mg by mouth 3 (three) times daily as needed for itching.  07/30/18  Yes [name], NP  insulin aspart (NOVOLOG FLEXPEN) 100 UNIT/ML FlexPen Inject 10 Units into the skin 3 (three) times daily with meals. Max daily dose of 50 units 08/19/18  Yes Shamleffer, [name], MD  Insulin Glargine, 2 Unit Dial, (TOUJEO MAX SOLOSTAR) 300 UNIT/ML SOPN Inject 40 Units into the skin daily. Patient assistance program provides 08/21/18  Yes Shamleffer, [name], MD  isosorbide mononitrate (IMDUR) 60 MG 24 hr tablet TAKE 1 TABLET BY MOUTH EVERY DAY Patient taking differently: Take 60 mg by mouth daily.  03/06/18  Yes Eulas Post, Monica, DO  meclizine (ANTIVERT) 25 MG tablet TAKE 1 TABLET BY MOUTH THREE TIMES DAILY AS NEEDED FOR DIZZINESS Patient taking differently: 25 mg 3 (three) times daily as needed for dizziness.  02/20/18  Yes Eulas Post, Monica, DO  memantine (NAMENDA) 5 MG tablet Take 2 tablets by mouth in the morning and 1 tablet in the evening  for memory Patient taking differently: Take 5-10 mg by mouth See admin instructions. Take 2 tablets by mouth in the morning and  1 tablet in the evening for memory 07/02/18  Yes [name], NP  metoprolol succinate (TOPROL-XL) 25 MG 24 hr tablet TAKE 1 TABLET BY MOUTH EVERY DAY FOR blood pressure Patient taking differently: Take 25 mg by mouth daily.  03/06/18  Yes Gildardo Cranker, DO  nitroGLYCERIN (NITROSTAT) 0.4 MG SL tablet Take one tablet under the tongue every 5 minutes as needed for chest pain 09/24/13  Yes [name], NP  oxyCODONE-acetaminophen (PERCOCET) 10-325 MG tablet Take 1 tablet by mouth every  6 (six) hours. 08/19/18  Yes [name], NP  pantoprazole (PROTONIX) 40 MG tablet TAKE 1 TABLET BY MOUTH DAILY Patient taking differently: Take 40 mg by mouth daily.  01/09/18  Yes Eulas Post, Monica, DO  pravastatin (PRAVACHOL) 20 MG tablet TAKE 1 TABLET BY MOUTH EVERY DAY FOR cholesterol Patient taking differently: Take 20 mg by mouth daily.  03/06/18  Yes Gildardo Cranker, DO  rivaroxaban (XARELTO) 10 MG TABS tablet Take 1 tablet (10 mg total) by mouth daily. 06/04/18  Yes [name], NP  clonazePAM (KLONOPIN) 1 MG tablet TAKE 2 TABLETS BY MOUTH AT BEDTIME AS NEEDED FOR ANXIETY Patient not taking: Reported on 08/27/2018 10/18/17   Gildardo Cranker, DO  Continuous Blood Gluc Receiver (FREESTYLE LIBRE 14 DAY READER) DEVI 1 Device by Does not apply route 4 (four) times daily -  before meals and at bedtime. For diabetes 12/11/17   Gildardo Cranker, DO  Continuous Blood Gluc Sensor (FREESTYLE LIBRE 14 DAY SENSOR) MISC 1 Units by Does not apply route 4 (four) times daily -  before meals and at bedtime. For diabetes 12/11/17   Gildardo Cranker, DO    Family History Family History  Problem Relation Age of Onset  . Breast cancer Mother 23  . Heart disease Mother   . Throat cancer Father   . Hypertension Father   . Arthritis Father   . Diabetes Father   . Arthritis Sister   . Obesity Sister   . Diabetes Sister   . Heart disease Cousin   . Colon cancer Neg Hx   . Stomach cancer Neg Hx   . Esophageal cancer  Neg Hx     Social History Social History   Tobacco Use  . Smoking status: Never Smoker  . Smokeless tobacco: Never Used  Substance Use Topics  . Alcohol use: No    Alcohol/week: 0.0 standard drinks  . Drug use: No     Allergies   Sulfonamide derivatives; Tramadol; and Aricept [donepezil hcl]   Review of Systems Review of Systems  Constitutional: Negative for chills and fever.  HENT: Negative for facial swelling and sore throat.   Respiratory: Positive for shortness of breath. Negative for cough.   Cardiovascular: Negative for chest pain.  Gastrointestinal: Positive for abdominal pain. Negative for diarrhea, nausea and vomiting.  Genitourinary: Negative for dysuria.  Musculoskeletal: Negative for back pain.  Skin: Negative for rash and wound.  Neurological: Negative for headaches.  Psychiatric/Behavioral: The patient is not nervous/anxious.      Physical Exam Updated Vital Signs BP (!) 156/103   Pulse 92   Temp 98 F (36.7 C) (Oral)   Resp 14   SpO2 99%   Physical Exam Vitals signs and nursing note reviewed.  Constitutional:      General: She is not in acute distress.    Appearance: She is well-developed. She is obese. She is not diaphoretic.  HENT:     Head: Normocephalic and atraumatic.     Mouth/Throat:     Pharynx: No oropharyngeal exudate.  Eyes:     General: No scleral icterus.       Right eye: No discharge.        Left eye: No discharge.     Conjunctiva/sclera: Conjunctivae normal.     Pupils: Pupils are equal, round, and reactive to light.  Neck:     Musculoskeletal: Normal range of motion and neck supple.     Thyroid: No thyromegaly.  Cardiovascular:     Rate and  Rhythm: Normal rate and regular rhythm.     Heart sounds: Normal heart sounds. No murmur. No friction rub. No gallop.   Pulmonary:     Effort: Pulmonary effort is normal. No tachypnea or respiratory distress.     Breath sounds: No stridor. Rales (bases, R>L) present. No wheezing.   Abdominal:     General: Bowel sounds are normal. There is no distension.     Palpations: Abdomen is soft.     Tenderness: There is no abdominal tenderness. There is no guarding or rebound.  Lymphadenopathy:     Cervical: No cervical adenopathy.  Skin:    General: Skin is warm and dry.     Coloration: Skin is not pale.     Findings: No rash.  Neurological:     Mental Status: She is alert.     Coordination: Coordination normal.     Comments: CN 3-12 intact; normal sensation throughout; 5/5 strength in all 4 extremities; equal bilateral grip strength; no pronator drift      ED Treatments / Results  Labs (all labs ordered are listed, but only abnormal results are displayed) Labs Reviewed  COMPREHENSIVE METABOLIC PANEL - Abnormal; Notable for the following components:      Result Value   CO2 20 (*)    Glucose, Bld 267 (*)    BUN 44 (*)    Creatinine, Ser 1.65 (*)    Albumin 2.9 (*)    GFR calc non Af Amer 29 (*)    GFR calc Af Amer 33 (*)    All other components within normal limits  CBC WITH DIFFERENTIAL/PLATELET - Abnormal; Notable for the following components:   WBC 13.3 (*)    RBC 3.50 (*)    Hemoglobin 10.3 (*)    HCT 33.4 (*)    Neutro Abs 10.0 (*)    All other components within normal limits  LIPASE, BLOOD  TROPONIN I  BRAIN NATRIURETIC PEPTIDE  URINALYSIS, ROUTINE W REFLEX MICROSCOPIC    EKG None  Radiology Dg Chest Portable 1 View  Result Date: 08/27/2018 CLINICAL DATA:  Shortness of breath EXAM: PORTABLE CHEST 1 VIEW COMPARISON:  October 08, 2017 FINDINGS: Lungs are clear. Heart is upper normal in size with pulmonary vascularity normal. No adenopathy. No bone lesions. IMPRESSION: No edema or consolidation.  Stable cardiac silhouette. Electronically Signed   By: Lowella Grip III M.D.   On: 08/27/2018 14:03    Procedures Procedures (including critical care time)  Medications Ordered in ED Medications  acetaminophen (TYLENOL) tablet 650 mg (has no  administration in time range)  furosemide (LASIX) injection 20 mg (20 mg Intravenous Given 08/27/18 1607)     Initial Impression / Assessment and Plan / ED Course  I have reviewed the triage vital signs and the nursing notes.  Pertinent labs & imaging results that were available during my care of the patient were reviewed by me and considered in my medical decision making (see chart for details).  Clinical Course as of Aug 26 1637  Tue Aug 27, 2018  1615 Multi weeks SOB and sputum  Plan: Increase lasix x 2 days, doxy for clinical dx pneumonia.  ------------------------------------- Complaint of dizzy x weeks just prior to discharge  Plan to CT head and d/c.   [BM]    Clinical Course User Index [BM] Deliah Boston, PA-C       Patient presenting with multiple complaints.  Labs are stable for the patient, except mild leukocytosis.  Chest x-ray is clear.  Although, rales were auscultated, and considering the sputum and and leukocytosis of 13.3, will cover with doxycycline.  Will also double Lasix for the next 2 days, considering patient states that she has not been urinating a lot despite taking Lasix.  Suspect possibility of occult pneumonia and mild CHF exacerbation.  I suspect a peripheral cause of dizziness and tension headache, however CT head is pending. Normal neuro exam without focal deficits. UA and CT are pending at shift change. At shift change, patient care transferred to [facility], PA-C for continued evaluation, follow up of CT head and UA and determination of disposition.  Plan to discharge if negative.  Patient also evaluated by my attending, Dr. Ralene Bathe, who guided the patient's management and agrees with plan.    Final Clinical Impressions(s) / ED Diagnoses   Final diagnoses:  Shortness of breath  Episodic tension-type headache, not intractable  Dizziness    ED Discharge Orders    None       Frederica Kuster, PA-C 08/27/18 1639    Quintella Reichert, MD  08/30/18 1000

## 2018-08-27 NOTE — ED Triage Notes (Signed)
Shortness of breath x1 week, evaluated by [facility] last week but refused transport, no improvement. No cough, fevers, COVID contact, or travel

## 2018-08-27 NOTE — ED Notes (Signed)
This tech went to ambulate the pt on pulse ox and the pt stated that she does not walk at home and uses a wheelchair. EDP notified.

## 2018-08-27 NOTE — ED Notes (Signed)
Patient back from CT.

## 2018-08-27 NOTE — ED Notes (Signed)
Pt now c/o headache intermittently and dizziness. States has hx of vertigo and takes her meclizine at home but it doesn't always work.

## 2018-08-28 ENCOUNTER — Other Ambulatory Visit: Payer: Self-pay | Admitting: *Deleted

## 2018-08-29 ENCOUNTER — Encounter: Payer: Self-pay | Admitting: Nurse Practitioner

## 2018-08-29 ENCOUNTER — Ambulatory Visit (INDEPENDENT_AMBULATORY_CARE_PROVIDER_SITE_OTHER): Payer: PPO | Admitting: Nurse Practitioner

## 2018-08-29 ENCOUNTER — Other Ambulatory Visit: Payer: Self-pay

## 2018-08-29 DIAGNOSIS — I5033 Acute on chronic diastolic (congestive) heart failure: Secondary | ICD-10-CM

## 2018-08-29 DIAGNOSIS — N183 Chronic kidney disease, stage 3 unspecified: Secondary | ICD-10-CM

## 2018-08-29 DIAGNOSIS — R0602 Shortness of breath: Secondary | ICD-10-CM

## 2018-08-29 DIAGNOSIS — R8271 Bacteriuria: Secondary | ICD-10-CM | POA: Diagnosis not present

## 2018-08-29 DIAGNOSIS — R42 Dizziness and giddiness: Secondary | ICD-10-CM

## 2018-08-29 LAB — URINE CULTURE: Culture: >=100000 — AB

## 2018-08-29 NOTE — Progress Notes (Addendum)
This service is provided via telemedicine  No vital signs collected/recorded due to the encounter was a telemedicine visit.   Location of patient (ex: home, work): home  Patient consents to a telephone visit: Yes  Location of the provider (ex: office, home): OFFICE   Name of any referring provider:Anshika Pethtel  NP  Names of all persons participating in the telemedicine service and their role in the encounter:Tiffany [place], [name] NP, patient Sabrine Patchen daughter Arville Go  Time spent on [place] spent 15 min  Patient has a tele visit for ER follow up  Virtual Visit via Telephone Note  I connected with [name] on 08/29/18 at  3:15 PM EDT by telephone and verified that I am speaking with the correct person using two identifiers.   I discussed the limitations, risks, security and privacy concerns of performing an evaluation and management service by telephone and the availability of in person appointments. I also discussed with the patient that there may be a patient responsible charge related to this service. The patient expressed understanding and agreed to proceed.     Careteam: Patient Care Team: [name], NP as PCP - General (Geriatric Medicine) [name], [name], MD (Inactive) as Consulting Physician (Cardiology) [name], MD as Consulting Physician (Ophthalmology) [name], MD as Consulting Physician (General Surgery) [name], MD (Inactive) as Referring Physician (Obstetrics and Gynecology) [name] [name], MD as Consulting Physician (Ophthalmology)  Advanced Directive information Does Patient Have a Medical Advance Directive?: No, Would patient like information on creating a medical advance directive?: No - Patient declined  Allergies  Allergen Reactions  . Sulfonamide Derivatives Swelling    Mouth swelling  . Tramadol Nausea And Vomiting  . Aricept [Donepezil Hcl]     GI upset/loose stools    Chief Complaint   Patient presents with  . Follow-up    ER follow up      HPI: Patient is a 84 y.o. female for ED follow up via tele-visit.  Pt reports she was having pains in her chest that went up to arm but they have resolved at this time.   Pt was also having shortness of breath and lasix was increased for 2 days and she was started on doxycycline 100 mg by mouth twice daily for pneumonia. Chest xray done in ED. Report states Lungs are clear. Heart is upper normal in size with pulmonary vascularity normal. No adenopathy. No bone lesions. No cough or congestion at this time. No fevers noted.   Also noted to have Headache- CT of head done in ED- No acute intracranial pathology.  Small-vessel white matter disease. Currently headache has improved.   Vertigo- has been bothering her more lately- taking meclizine which has been making the dizziness better.   Urine culture was positive for e. Coli UTI. She does not have dysuria, frequency or burning with urination. No abdominal pain/discomfort or odor.   Noted to have elevated WBC at 13.1 however baseline wbc range from 10-13  Review of Systems:  Review of Systems  Constitutional: Negative for chills, fever and malaise/fatigue.  HENT: Negative.   Respiratory: Negative for cough, hemoptysis and shortness of breath.   Cardiovascular: Negative for chest pain and leg swelling.  Gastrointestinal: Negative for blood in stool, constipation, diarrhea and heartburn.  Genitourinary: Negative for dysuria, flank pain, frequency, hematuria and urgency.  Musculoskeletal: Positive for joint pain.  Neurological: Positive for dizziness, tingling, sensory change and headaches (improved).  Endo/Heme/Allergies: Bruises/bleeds easily.  Psychiatric/Behavioral: Positive  for memory loss.    Past Medical History:  Diagnosis Date  . Allergy    takes Mucinex daily as needed  . Anemia, unspecified   . Anxiety    takes Clonazepam daily as needed  . Arthritis   . CHF  (congestive heart failure) (HCC)    takes Furosemide daily  . Coronary artery disease    a. s/p IMI 2004 tx with BMS to RCA;  b. s/p Promus DES to RCA 2/12 c. 06/2014 High risk stress test follow up cath 06/2014 showd patent stent--< med therapy for other mild to moedrate disease   . Coronary atherosclerosis of native coronary artery   . Depression    takes Cymbalta daily  . Diabetes mellitus    insulin daily  . Dysuria   . GERD (gastroesophageal reflux disease)    takes Protonix daily  . Gout, unspecified    takes Colchicine daily  . Headache    occasionally  . History of blood clots about 38yrs ago   in legs-takes Coumadin   . Hyperlipidemia    takes Pravastatin daily  . Hypertension    takes Imdur and Metoprolol daily  . Insomnia   . Joint pain   . Joint swelling   . Myocardial infarction ([place]) 2004  . Numbness   . Obstructive sleep apnea   . Osteoarthritis   . Osteoarthritis   . Osteoarthrosis, unspecified whether generalized or localized, lower leg   . Pain, chronic   . Polymyalgia rheumatica ([place])   . Pulmonary emboli ([place]) 9/13   felt to need lifelong anticoagulation  . Shortness of breath dyspnea    with exertion and has Albuterol inhaler prn  . Type II or unspecified type diabetes mellitus without mention of complication, uncontrolled   . Urinary incontinence    takes Linzess daily  . Vertigo    hx of;was taking Meclizine if needed   Past Surgical History:  Procedure Laterality Date  . ABDOMINAL HYSTERECTOMY     partial  . APPENDECTOMY    . blood clots/legs and lungs  2013  . BREAST BIOPSY Left 07/22/2014  . BREAST BIOPSY Left 02/10/2013  . BREAST LUMPECTOMY Left 11/05/2014  . BREAST LUMPECTOMY WITH RADIOACTIVE SEED LOCALIZATION Left 11/05/2014   Procedure: LEFT BREAST LUMPECTOMY WITH RADIOACTIVE SEED LOCALIZATION;  Surgeon: [name], MD;  Location: [facility];  Service: General;  Laterality: Left;  . CARDIAC CATHETERIZATION    . COLONOSCOPY    .  CORONARY ANGIOPLASTY  2  . ESOPHAGOGASTRODUODENOSCOPY (EGD) WITH PROPOFOL N/A 11/07/2016   Procedure: ESOPHAGOGASTRODUODENOSCOPY (EGD) WITH PROPOFOL;  Surgeon: Gatha Mayer, MD;  Location: [facility];  Service: Endoscopy;  Laterality: N/A;  . EXCISION OF SKIN TAG Right 11/05/2014   Procedure: EXCISION OF RIGHT EYELID SKIN TAG;  Surgeon: [name], MD;  Location: [facility];  Service: General;  Laterality: Right;  . EYE SURGERY Bilateral    cataract   . GASTRIC BYPASS  1977    reversed in 1979, [place] N/A 06/29/2014   Procedure: LEFT HEART CATHETERIZATION WITH CORONARY ANGIOGRAM;  Surgeon: Troy Sine, MD;  Location: [facility] CATH LAB;  Service: Cardiovascular;  Laterality: N/A;  . MI with stent placement  2004   Social History:   reports that she has never smoked. She has never used smokeless tobacco. She reports that she does not drink alcohol or use drugs.  Family History  Problem Relation Age of Onset  . Breast cancer  Mother 17  . Heart disease Mother   . Throat cancer Father   . Hypertension Father   . Arthritis Father   . Diabetes Father   . Arthritis Sister   . Obesity Sister   . Diabetes Sister   . Heart disease Cousin   . Colon cancer Neg Hx   . Stomach cancer Neg Hx   . Esophageal cancer Neg Hx     Medications: Patient's Medications  New Prescriptions   No medications on file  Previous Medications   ALBUTEROL (PROVENTIL HFA;VENTOLIN HFA) 108 (90 BASE) MCG/ACT INHALER    Inhale 1-2 puffs into the lungs every 6 (six) hours as needed for wheezing or shortness of breath.   ANASTROZOLE (ARIMIDEX) 1 MG TABLET    TAKE 1 TABLET BY MOUTH EVERY DAY   BUDESONIDE-FORMOTEROL (SYMBICORT) 160-4.5 MCG/ACT INHALER    Inhale 2 puffs into the lungs 2 (two) times daily. For bronchitis   CLONAZEPAM (KLONOPIN) 1 MG TABLET    TAKE 2 TABLETS BY MOUTH AT BEDTIME AS NEEDED FOR ANXIETY   CONTINUOUS BLOOD GLUC RECEIVER (FREESTYLE  LIBRE 14 DAY READER) DEVI    1 Device by Does not apply route 4 (four) times daily -  before meals and at bedtime. For diabetes   CONTINUOUS BLOOD GLUC SENSOR (FREESTYLE LIBRE 14 DAY SENSOR) MISC    1 Units by Does not apply route 4 (four) times daily -  before meals and at bedtime. For diabetes   DOXYCYCLINE (VIBRAMYCIN) 100 MG CAPSULE    Take 1 capsule (100 mg total) by mouth 2 (two) times daily for 7 days.   DULOXETINE (CYMBALTA) 60 MG CAPSULE    Take one capsule by mouth once daily for anxiety   FEBUXOSTAT (ULORIC) 40 MG TABLET    Take 1 tablet (40 mg total) by mouth daily.   FERROUS GLUCONATE (FERGON) 324 MG TABLET    Take 1 tablet (324 mg total) by mouth daily with breakfast.   FUROSEMIDE (LASIX) 20 MG TABLET    Take 1 tablet (20 mg total) by mouth daily as needed.   HYDRALAZINE (APRESOLINE) 50 MG TABLET    TAKE 1 TABLET BY MOUTH 2 TIMES DAILY   INSULIN ASPART (NOVOLOG FLEXPEN) 100 UNIT/ML FLEXPEN    Inject 10 Units into the skin 3 (three) times daily with meals. Max daily dose of 50 units   INSULIN GLARGINE, 2 UNIT DIAL, (TOUJEO MAX SOLOSTAR) 300 UNIT/ML SOPN    Inject 40 Units into the skin daily. Patient assistance program provides   ISOSORBIDE MONONITRATE (IMDUR) 60 MG 24 HR TABLET    TAKE 1 TABLET BY MOUTH EVERY DAY   MECLIZINE (ANTIVERT) 25 MG TABLET    TAKE 1 TABLET BY MOUTH THREE TIMES DAILY AS NEEDED FOR DIZZINESS   MEMANTINE (NAMENDA) 5 MG TABLET    Take 2 tablets by mouth in the morning and 1 tablet in the evening  for memory   METOPROLOL SUCCINATE (TOPROL-XL) 25 MG 24 HR TABLET    TAKE 1 TABLET BY MOUTH EVERY DAY FOR blood pressure   NITROGLYCERIN (NITROSTAT) 0.4 MG SL TABLET    Take one tablet under the tongue every 5 minutes as needed for chest pain   OXYCODONE-ACETAMINOPHEN (PERCOCET) 10-325 MG TABLET    Take 1 tablet by mouth every 6 (six) hours.   PRAVASTATIN (PRAVACHOL) 20 MG TABLET    TAKE 1 TABLET BY MOUTH EVERY DAY FOR cholesterol   RIVAROXABAN (XARELTO) 10 MG TABS TABLET  Take 1 tablet (10 mg total) by mouth daily.  Modified Medications   No medications on file  Discontinued Medications   HYDROXYZINE (ATARAX/VISTARIL) 10 MG TABLET    TAKE 1 TABLET BY MOUTH 3 TIMES DAILY AS NEEDED FOR ITCHING   PANTOPRAZOLE (PROTONIX) 40 MG TABLET    TAKE 1 TABLET BY MOUTH DAILY     Physical Exam: unable due to tele-visit.     Labs reviewed: Basic Metabolic Panel: Recent Labs    10/09/17 0049 10/09/17 0715 10/10/17 0448  10/24/17 1227  07/31/18 1059 08/06/18 1122 08/27/18 1355  NA  --  139 134*   < > 133*   < > 138 137 136  K  --  3.8 4.1   < > 5.4*   < > 5.9* 5.3 5.1  CL  --  99* 101   < > 98   < > 106 106 105  CO2  --  29 28   < > 27   < > 26 26 20*  GLUCOSE  --  214* 273*   < > 211*   < > 117* 159* 267*  BUN  --  22* 25*   < > 37*   < > 48* 48* 44*  CREATININE  --  1.23* 1.47*   < > 1.86*   < > 2.05* 1.68* 1.65*  CALCIUM  --  9.1 8.4*   < > 8.8   < > 8.8 8.7 9.1  MG  --  1.2* 1.5*  --  1.4*  --   --   --   --   PHOS  --  3.7  --   --   --   --   --   --   --   TSH 0.884  --   --   --   --   --   --   --   --    < > = values in this interval not displayed.   Liver Function Tests: Recent Labs    11/27/17 1326 03/11/18 1557 05/28/18 1324 08/27/18 1355  AST 10* 11 10* 16  ALT 9 8 7 14   ALKPHOS 89  --  80 70  BILITOT 0.3 0.2 0.4 0.8  PROT 8.4* 7.3 7.9 7.9  ALBUMIN 3.0*  --  3.0* 2.9*   Recent Labs    08/27/18 1355  LIPASE 39   No results for input(s): AMMONIA in the last 8760 hours. CBC: Recent Labs    03/11/18 1557 05/28/18 1324 08/27/18 1355  WBC 11.7* 10.2 13.3*  NEUTROABS 8,213* 7.1 10.0*  HGB 10.1* 9.4* 10.3*  HCT 30.9* 31.1* 33.4*  MCV 88.3 95.7 95.4  PLT 290 270 281   Lipid Panel: Recent Labs    07/17/18 1141  CHOL 90  HDL 35*  LDLCALC 40  TRIG 74  CHOLHDL 2.6   TSH: Recent Labs    10/09/17 0049  TSH 0.884   A1C: Lab Results  Component Value Date   HGBA1C 8.3 (H) 07/17/2018     Assessment/Plan 1. Acute  on chronic diastolic CHF (congestive heart failure) (HCC) -stable. Without increase swelling or edema or shortness of breath at this time. Continues on lasix and metoprolol.   2. CKD (chronic kidney disease) stage 3, GFR 30-59 ml/min (HCC) Labs stable from ED. Encouraged proper hydration and to avoid NSAIDS (Aleve, Advil, Motrin, Ibuprofen)   3. Shortness of breath Has improved with doxycyline and increasing lasix for 2 days. Will continue doxycyline at this  time.   4. Asymptomatic bacteriuria Reviewed culture with over 100,000 colonies of e. Coli however she is completely asymptomatic. To make sure she is staying well hydrated and to notify if symptoms occur.  5. Vertigo Stable, without worsening of dizziness, continues on meclizine which has improved symptoms.    Next appt: 10/17/2018 Carlos American. Harle Battiest  [facility] [phone]   Follow Up Instructions:    I discussed the assessment and treatment plan with the patient. The patient was provided an opportunity to ask questions and all were answered. The patient agreed with the plan and demonstrated an understanding of the instructions.   The patient was advised to call back or seek an in-person evaluation if the symptoms worsen or if the condition fails to improve as anticipated.  I provided 14  minutes of non-face-to-face time during this encounter.  avs printed and mailed.  [name], NP

## 2018-08-30 ENCOUNTER — Ambulatory Visit: Payer: PPO | Admitting: Nurse Practitioner

## 2018-08-30 ENCOUNTER — Telehealth: Payer: Self-pay

## 2018-08-30 NOTE — Telephone Encounter (Signed)
Post ED Visit - Positive Culture Follow-up  Culture report reviewed by antimicrobial stewardship pharmacist: [place] Team []  Elenor Quinones, Pharm.D. []  Heide Guile, Pharm.D., BCPS AQ-ID []  Parks Neptune, Pharm.D., BCPS []  Alycia Rossetti, Pharm.D., BCPS []  [place], Pharm.D., BCPS, AAHIVP []  Legrand Como, Pharm.D., BCPS, AAHIVP [x]  Salome Arnt, PharmD, BCPS []  Johnnette Gourd, PharmD, BCPS []  Hughes Better, PharmD, BCPS []  Leeroy Cha, PharmD []  Laqueta Linden, PharmD, BCPS []  Albertina Parr, PharmD  [place] Team []  Leodis Sias, PharmD []  Lindell Spar, PharmD []  Royetta Asal, PharmD []  Graylin Shiver, Rph []  Rema Fendt) Glennon Mac, PharmD []  Arlyn Dunning, PharmD []  Netta Cedars, PharmD []  [name], PharmD []  Leone Haven, PharmD []  Gretta Arab, PharmD []  Theodis Shove, PharmD []  Peggyann Juba, PharmD []  Reuel Boom, PharmD   Positive urine culture and no further patient follow-up is required at this time.  Genia Del 08/30/2018, 8:13 AM

## 2018-09-03 ENCOUNTER — Ambulatory Visit (INDEPENDENT_AMBULATORY_CARE_PROVIDER_SITE_OTHER): Payer: PPO | Admitting: Nurse Practitioner

## 2018-09-03 ENCOUNTER — Encounter: Payer: Self-pay | Admitting: Nurse Practitioner

## 2018-09-03 ENCOUNTER — Other Ambulatory Visit: Payer: Self-pay

## 2018-09-03 ENCOUNTER — Other Ambulatory Visit: Payer: Self-pay | Admitting: *Deleted

## 2018-09-03 DIAGNOSIS — E875 Hyperkalemia: Secondary | ICD-10-CM | POA: Diagnosis not present

## 2018-09-03 DIAGNOSIS — N390 Urinary tract infection, site not specified: Secondary | ICD-10-CM | POA: Diagnosis not present

## 2018-09-03 DIAGNOSIS — J410 Simple chronic bronchitis: Secondary | ICD-10-CM

## 2018-09-03 MED ORDER — BUDESONIDE-FORMOTEROL FUMARATE 160-4.5 MCG/ACT IN AERO: 2.0000 | INHALATION_SPRAY | Freq: Two times a day (BID) | RESPIRATORY_TRACT | 3 refills | Status: DC

## 2018-09-03 MED ORDER — SODIUM POLYSTYRENE SULFONATE 15 GM/60ML PO SUSP: ORAL | 2 refills | Status: DC

## 2018-09-03 MED ORDER — CEPHALEXIN 500 MG PO CAPS: 500.0000 mg | ORAL_CAPSULE | Freq: Two times a day (BID) | ORAL | 0 refills | Status: DC

## 2018-09-03 NOTE — Progress Notes (Signed)
This service is provided via telemedicine  No vital signs collected/recorded due to the encounter was a telemedicine visit.   Location of patient (ex: home, work):  Home  Patient consents to a telephone visit:  Yes  Location of the provider (ex: office, home):  [employer], office  Names of all persons participating in the telemedicine service and their role in the encounter:  Leafy Half CMA, [name] NP, and patient.   Time spent on call:  5 mins  Virtual Visit via Telephone Note  I connected with [name] on 09/03/18 at  1:30 PM EDT by telephone and verified that I am speaking with the correct person using two identifiers.   I discussed the limitations, risks, security and privacy concerns of performing an evaluation and management service by telephone and the availability of in person appointments. I also discussed with the patient that there may be a patient responsible charge related to this service. The patient expressed understanding and agreed to proceed.      Careteam: Patient Care Team: [name], NP as PCP - General (Geriatric Medicine) [name], [name], MD (Inactive) as Consulting Physician (Cardiology) [name], MD as Consulting Physician (Ophthalmology) [name], MD as Consulting Physician (General Surgery) [name], MD (Inactive) as Referring Physician (Obstetrics and Gynecology) [name] [name], MD as Consulting Physician (Ophthalmology)  Advanced Directive information    Allergies  Allergen Reactions  . Sulfonamide Derivatives Swelling    Mouth swelling  . Tramadol Nausea And Vomiting  . Aricept [Donepezil Hcl]     GI upset/loose stools    Chief Complaint  Patient presents with  . Acute Visit    Burning with urination. Patient on last day of doxycycline. [name] the patient's daughter will need to verify meds in about an hour once with the patient.      HPI: Patient is a 84 y.o. female who reports  increase burning with urination.  States yesterday had several episodes of burning with urinate. Started in the morning then during the day was okay but burning reoccurred at night.  Also last night had an episode on incontinence which was painful. No fevers or chills. No abdominal pain noted.  During ED visit UA C&S was obtained which revealed e.coli however during ED follow up she was completely asymptomatic of any urinary complaints.   Hyperkalemia- pt was instructed to take kayexalate weekly due to recurrent hyperkalemia with CKD, she is currently followed by nephrologist. She did not tolerate lokelma due to diarrhea. Has not taken for the last several weeks because she ran out.   Review of Systems:  Review of Systems  Constitutional: Negative for chills, fever and weight loss.  Respiratory: Negative for cough, sputum production, shortness of breath and wheezing.   Cardiovascular: Negative for chest pain.  Gastrointestinal: Negative for abdominal pain, constipation, diarrhea, heartburn, nausea and vomiting.  Genitourinary: Positive for dysuria and urgency. Negative for flank pain, frequency and hematuria.    Past Medical History:  Diagnosis Date  . Allergy    takes Mucinex daily as needed  . Anemia, unspecified   . Anxiety    takes Clonazepam daily as needed  . Arthritis   . CHF (congestive heart failure) (HCC)    takes Furosemide daily  . Coronary artery disease    a. s/p IMI 2004 tx with BMS to RCA;  b. s/p Promus DES to RCA 2/12 c. 06/2014 High risk stress test follow up cath 06/2014 showd patent stent--< med  therapy for other mild to moedrate disease   . Coronary atherosclerosis of native coronary artery   . Depression    takes Cymbalta daily  . Diabetes mellitus    insulin daily  . Dysuria   . GERD (gastroesophageal reflux disease)    takes Protonix daily  . Gout, unspecified    takes Colchicine daily  . Headache    occasionally  . History of blood clots about 47yrs ago    in legs-takes Coumadin   . Hyperlipidemia    takes Pravastatin daily  . Hypertension    takes Imdur and Metoprolol daily  . Insomnia   . Joint pain   . Joint swelling   . Myocardial infarction ([place]) 2004  . Numbness   . Obstructive sleep apnea   . Osteoarthritis   . Osteoarthritis   . Osteoarthrosis, unspecified whether generalized or localized, lower leg   . Pain, chronic   . Polymyalgia rheumatica ([place])   . Pulmonary emboli ([place]) 9/13   felt to need lifelong anticoagulation  . Shortness of breath dyspnea    with exertion and has Albuterol inhaler prn  . Type II or unspecified type diabetes mellitus without mention of complication, uncontrolled   . Urinary incontinence    takes Linzess daily  . Vertigo    hx of;was taking Meclizine if needed   Past Surgical History:  Procedure Laterality Date  . ABDOMINAL HYSTERECTOMY     partial  . APPENDECTOMY    . blood clots/legs and lungs  2013  . BREAST BIOPSY Left 07/22/2014  . BREAST BIOPSY Left 02/10/2013  . BREAST LUMPECTOMY Left 11/05/2014  . BREAST LUMPECTOMY WITH RADIOACTIVE SEED LOCALIZATION Left 11/05/2014   Procedure: LEFT BREAST LUMPECTOMY WITH RADIOACTIVE SEED LOCALIZATION;  Surgeon: [name], MD;  Location: [facility];  Service: General;  Laterality: Left;  . CARDIAC CATHETERIZATION    . COLONOSCOPY    . CORONARY ANGIOPLASTY  2  . ESOPHAGOGASTRODUODENOSCOPY (EGD) WITH PROPOFOL N/A 11/07/2016   Procedure: ESOPHAGOGASTRODUODENOSCOPY (EGD) WITH PROPOFOL;  Surgeon: Gatha Mayer, MD;  Location: [facility];  Service: Endoscopy;  Laterality: N/A;  . EXCISION OF SKIN TAG Right 11/05/2014   Procedure: EXCISION OF RIGHT EYELID SKIN TAG;  Surgeon: [name], MD;  Location: [facility];  Service: General;  Laterality: Right;  . EYE SURGERY Bilateral    cataract   . GASTRIC BYPASS  1977    reversed in 1979, [place] N/A 06/29/2014   Procedure: LEFT HEART  CATHETERIZATION WITH CORONARY ANGIOGRAM;  Surgeon: Troy Sine, MD;  Location: [facility] CATH LAB;  Service: Cardiovascular;  Laterality: N/A;  . MI with stent placement  2004   Social History:   reports that she has never smoked. She has never used smokeless tobacco. She reports that she does not drink alcohol or use drugs.  Family History  Problem Relation Age of Onset  . Breast cancer Mother 23  . Heart disease Mother   . Throat cancer Father   . Hypertension Father   . Arthritis Father   . Diabetes Father   . Arthritis Sister   . Obesity Sister   . Diabetes Sister   . Heart disease Cousin   . Colon cancer Neg Hx   . Stomach cancer Neg Hx   . Esophageal cancer Neg Hx     Medications: Patient's Medications  New Prescriptions   No medications on file  Previous Medications   ALBUTEROL (PROVENTIL HFA;VENTOLIN HFA)  108 (90 BASE) MCG/ACT INHALER    Inhale 1-2 puffs into the lungs every 6 (six) hours as needed for wheezing or shortness of breath.   ANASTROZOLE (ARIMIDEX) 1 MG TABLET    TAKE 1 TABLET BY MOUTH EVERY DAY   BUDESONIDE-FORMOTEROL (SYMBICORT) 160-4.5 MCG/ACT INHALER    Inhale 2 puffs into the lungs 2 (two) times daily. For bronchitis   CLONAZEPAM (KLONOPIN) 1 MG TABLET    TAKE 2 TABLETS BY MOUTH AT BEDTIME AS NEEDED FOR ANXIETY   CONTINUOUS BLOOD GLUC RECEIVER (FREESTYLE LIBRE 14 DAY READER) DEVI    1 Device by Does not apply route 4 (four) times daily -  before meals and at bedtime. For diabetes   CONTINUOUS BLOOD GLUC SENSOR (FREESTYLE LIBRE 14 DAY SENSOR) MISC    1 Units by Does not apply route 4 (four) times daily -  before meals and at bedtime. For diabetes   DOXYCYCLINE (VIBRAMYCIN) 100 MG CAPSULE    Take 1 capsule (100 mg total) by mouth 2 (two) times daily for 7 days.   DULOXETINE (CYMBALTA) 60 MG CAPSULE    Take one capsule by mouth once daily for anxiety   FEBUXOSTAT (ULORIC) 40 MG TABLET    Take 1 tablet (40 mg total) by mouth daily.   FERROUS GLUCONATE (FERGON) 324  MG TABLET    Take 1 tablet (324 mg total) by mouth daily with breakfast.   FUROSEMIDE (LASIX) 20 MG TABLET    Take 1 tablet (20 mg total) by mouth daily as needed.   HYDRALAZINE (APRESOLINE) 50 MG TABLET    TAKE 1 TABLET BY MOUTH 2 TIMES DAILY   INSULIN ASPART (NOVOLOG FLEXPEN) 100 UNIT/ML FLEXPEN    Inject 10 Units into the skin 3 (three) times daily with meals. Max daily dose of 50 units   INSULIN GLARGINE, 2 UNIT DIAL, (TOUJEO MAX SOLOSTAR) 300 UNIT/ML SOPN    Inject 40 Units into the skin daily. Patient assistance program provides   ISOSORBIDE MONONITRATE (IMDUR) 60 MG 24 HR TABLET    TAKE 1 TABLET BY MOUTH EVERY DAY   MECLIZINE (ANTIVERT) 25 MG TABLET    TAKE 1 TABLET BY MOUTH THREE TIMES DAILY AS NEEDED FOR DIZZINESS   MEMANTINE (NAMENDA) 5 MG TABLET    Take 2 tablets by mouth in the morning and 1 tablet in the evening  for memory   METOPROLOL SUCCINATE (TOPROL-XL) 25 MG 24 HR TABLET    TAKE 1 TABLET BY MOUTH EVERY DAY FOR blood pressure   NITROGLYCERIN (NITROSTAT) 0.4 MG SL TABLET    Take one tablet under the tongue every 5 minutes as needed for chest pain   OXYCODONE-ACETAMINOPHEN (PERCOCET) 10-325 MG TABLET    Take 1 tablet by mouth every 6 (six) hours.   PRAVASTATIN (PRAVACHOL) 20 MG TABLET    TAKE 1 TABLET BY MOUTH EVERY DAY FOR cholesterol   RIVAROXABAN (XARELTO) 10 MG TABS TABLET    Take 1 tablet (10 mg total) by mouth daily.  Modified Medications   No medications on file  Discontinued Medications   No medications on file     Physical Exam: unable due to tele-visit.    Labs reviewed: Basic Metabolic Panel: Recent Labs    10/09/17 0049 10/09/17 0715 10/10/17 0448  10/24/17 1227  07/31/18 1059 08/06/18 1122 08/27/18 1355  NA  --  139 134*   < > 133*   < > 138 137 136  K  --  3.8 4.1   < >  5.4*   < > 5.9* 5.3 5.1  CL  --  99* 101   < > 98   < > 106 106 105  CO2  --  29 28   < > 27   < > 26 26 20*  GLUCOSE  --  214* 273*   < > 211*   < > 117* 159* 267*  BUN  --  22*  25*   < > 37*   < > 48* 48* 44*  CREATININE  --  1.23* 1.47*   < > 1.86*   < > 2.05* 1.68* 1.65*  CALCIUM  --  9.1 8.4*   < > 8.8   < > 8.8 8.7 9.1  MG  --  1.2* 1.5*  --  1.4*  --   --   --   --   PHOS  --  3.7  --   --   --   --   --   --   --   TSH 0.884  --   --   --   --   --   --   --   --    < > = values in this interval not displayed.   Liver Function Tests: Recent Labs    11/27/17 1326 03/11/18 1557 05/28/18 1324 08/27/18 1355  AST 10* 11 10* 16  ALT 9 8 7 14   ALKPHOS 89  --  80 70  BILITOT 0.3 0.2 0.4 0.8  PROT 8.4* 7.3 7.9 7.9  ALBUMIN 3.0*  --  3.0* 2.9*   Recent Labs    08/27/18 1355  LIPASE 39   No results for input(s): AMMONIA in the last 8760 hours. CBC: Recent Labs    03/11/18 1557 05/28/18 1324 08/27/18 1355  WBC 11.7* 10.2 13.3*  NEUTROABS 8,213* 7.1 10.0*  HGB 10.1* 9.4* 10.3*  HCT 30.9* 31.1* 33.4*  MCV 88.3 95.7 95.4  PLT 290 270 281   Lipid Panel: Recent Labs    07/17/18 1141  CHOL 90  HDL 35*  LDLCALC 40  TRIG 74  CHOLHDL 2.6   TSH: Recent Labs    10/09/17 0049  TSH 0.884   A1C: Lab Results  Component Value Date   HGBA1C 8.3 (H) 07/17/2018     Assessment/Plan 1. Urinary tract infection without hematuria, site unspecified -currently with compliant of dysuria and urgency. Culture obtained during hospitalization that reveals e.coli UTI.  -to make sure he is getting proper hydration.  - cephALEXin (KEFLEX) 500 MG capsule; Take 1 capsule (500 mg total) by mouth 2 (two) times daily.  Dispense: 14 capsule; Refill: 0 -to take probiotic twice daily  2. Hyperkalemia -stopped kayexalate after bottle was complete. Due to recurrent hyperkalemia has been advised to continue kayexalate weekly.  - sodium polystyrene (KAYEXALATE) 15 GM/60ML suspension; 15 gm by mouth weekly to keep potassium down.  Dispense: 473 mL; Refill: 2  Next appt: 10/17/2018 [employer]. Harle Battiest  [facility] [phone]    Follow Up Instructions:    I discussed the assessment and treatment plan with the patient. The patient was provided an opportunity to ask questions and all were answered. The patient agreed with the plan and demonstrated an understanding of the instructions.   The patient was advised to call back or seek an in-person evaluation if the symptoms worsen or if the condition fails to improve as anticipated.  I provided 16 minutes of non-face-to-face time during this encounter.  [name], NP

## 2018-09-03 NOTE — Telephone Encounter (Signed)
[employer]

## 2018-09-06 DIAGNOSIS — N183 Chronic kidney disease, stage 3 (moderate): Secondary | ICD-10-CM | POA: Diagnosis not present

## 2018-09-07 DIAGNOSIS — E1169 Type 2 diabetes mellitus with other specified complication: Secondary | ICD-10-CM | POA: Diagnosis not present

## 2018-09-17 ENCOUNTER — Other Ambulatory Visit: Payer: Self-pay | Admitting: Hematology

## 2018-09-17 ENCOUNTER — Other Ambulatory Visit: Payer: Self-pay | Admitting: *Deleted

## 2018-09-17 ENCOUNTER — Other Ambulatory Visit: Payer: Self-pay | Admitting: Nurse Practitioner

## 2018-09-17 MED ORDER — METOPROLOL SUCCINATE ER 25 MG PO TB24: ORAL_TABLET | ORAL | 1 refills | Status: DC

## 2018-09-17 MED ORDER — DULOXETINE HCL 60 MG PO CPEP: ORAL_CAPSULE | ORAL | 1 refills | Status: DC

## 2018-09-17 MED ORDER — ISOSORBIDE MONONITRATE ER 60 MG PO TB24: ORAL_TABLET | ORAL | 1 refills | Status: DC

## 2018-09-17 MED ORDER — PRAVASTATIN SODIUM 20 MG PO TABS: ORAL_TABLET | ORAL | 1 refills | Status: DC

## 2018-09-17 NOTE — Telephone Encounter (Signed)
[facility]

## 2018-09-17 NOTE — Telephone Encounter (Signed)
Supplement request, not on current medication list. Please advise if ok to provider RX

## 2018-09-18 ENCOUNTER — Telehealth: Payer: Self-pay

## 2018-09-18 ENCOUNTER — Other Ambulatory Visit: Payer: Self-pay | Admitting: *Deleted

## 2018-09-18 DIAGNOSIS — I129 Hypertensive chronic kidney disease with stage 1 through stage 4 chronic kidney disease, or unspecified chronic kidney disease: Secondary | ICD-10-CM | POA: Diagnosis not present

## 2018-09-18 DIAGNOSIS — C50912 Malignant neoplasm of unspecified site of left female breast: Secondary | ICD-10-CM | POA: Diagnosis not present

## 2018-09-18 DIAGNOSIS — G4733 Obstructive sleep apnea (adult) (pediatric): Secondary | ICD-10-CM | POA: Diagnosis not present

## 2018-09-18 DIAGNOSIS — N183 Chronic kidney disease, stage 3 (moderate): Secondary | ICD-10-CM | POA: Diagnosis not present

## 2018-09-18 DIAGNOSIS — N2581 Secondary hyperparathyroidism of renal origin: Secondary | ICD-10-CM | POA: Diagnosis not present

## 2018-09-18 DIAGNOSIS — Z6841 Body Mass Index (BMI) 40.0 and over, adult: Secondary | ICD-10-CM | POA: Diagnosis not present

## 2018-09-18 DIAGNOSIS — I5030 Unspecified diastolic (congestive) heart failure: Secondary | ICD-10-CM | POA: Diagnosis not present

## 2018-09-18 DIAGNOSIS — D631 Anemia in chronic kidney disease: Secondary | ICD-10-CM | POA: Diagnosis not present

## 2018-09-18 DIAGNOSIS — E1122 Type 2 diabetes mellitus with diabetic chronic kidney disease: Secondary | ICD-10-CM | POA: Diagnosis not present

## 2018-09-18 DIAGNOSIS — G894 Chronic pain syndrome: Secondary | ICD-10-CM

## 2018-09-18 DIAGNOSIS — I2510 Atherosclerotic heart disease of native coronary artery without angina pectoris: Secondary | ICD-10-CM | POA: Diagnosis not present

## 2018-09-18 DIAGNOSIS — E875 Hyperkalemia: Secondary | ICD-10-CM | POA: Diagnosis not present

## 2018-09-18 MED ORDER — OXYCODONE-ACETAMINOPHEN 10-325 MG PO TABS: 1.0000 | ORAL_TABLET | Freq: Four times a day (QID) | ORAL | 0 refills | Status: DC

## 2018-09-18 NOTE — Telephone Encounter (Signed)
Patient's daughter [name] called and expressed concern for patient's depression getting worse and wanting to get her Xarelto 10 mg tab increased. Explained to daughter that Xarelto would not be used to treat depression and she would need to speak with a provider about concerns with patient's depression and need for medication. Daughter stated she did not want to schedule an appointment at this time and would just call back.

## 2018-09-18 NOTE — Telephone Encounter (Signed)
Patient requested refill [facility] [facility] Verified LR: 08/19/2018 Pended Rx and sent to [facility] for approval.

## 2018-09-18 NOTE — Telephone Encounter (Signed)
Okay thank you

## 2018-09-18 NOTE — Telephone Encounter (Signed)
Tried offering patient's daughter [name] a tele-visit multiple time. [name] stated she was not interested in scheduling an appointment at this time or future appointment and she would call back.

## 2018-09-18 NOTE — Telephone Encounter (Signed)
Yes her xarelto is her blood thinner, if she has concerns over depression I would need to speak with them and to not try to alter medication without discussion with me. Please offer tele-visit for tomorrow.

## 2018-09-19 ENCOUNTER — Other Ambulatory Visit: Payer: Self-pay | Admitting: *Deleted

## 2018-09-19 DIAGNOSIS — F418 Other specified anxiety disorders: Secondary | ICD-10-CM

## 2018-09-19 MED ORDER — CLONAZEPAM 1 MG PO TABS: ORAL_TABLET | ORAL | 0 refills | Status: DC

## 2018-09-19 NOTE — Telephone Encounter (Signed)
Received fax from [place] requesting refill  [facility] Verified LR: 10/18/2017  Pended Rx and sent to [facility] for approval.

## 2018-09-20 ENCOUNTER — Other Ambulatory Visit: Payer: Self-pay

## 2018-09-20 ENCOUNTER — Encounter: Payer: Self-pay | Admitting: Internal Medicine

## 2018-09-20 ENCOUNTER — Ambulatory Visit (INDEPENDENT_AMBULATORY_CARE_PROVIDER_SITE_OTHER): Payer: PPO | Admitting: Internal Medicine

## 2018-09-20 DIAGNOSIS — Z794 Long term (current) use of insulin: Secondary | ICD-10-CM | POA: Diagnosis not present

## 2018-09-20 DIAGNOSIS — E1165 Type 2 diabetes mellitus with hyperglycemia: Secondary | ICD-10-CM

## 2018-09-20 DIAGNOSIS — E1140 Type 2 diabetes mellitus with diabetic neuropathy, unspecified: Secondary | ICD-10-CM | POA: Diagnosis not present

## 2018-09-20 DIAGNOSIS — N183 Chronic kidney disease, stage 3 unspecified: Secondary | ICD-10-CM

## 2018-09-20 DIAGNOSIS — E1121 Type 2 diabetes mellitus with diabetic nephropathy: Secondary | ICD-10-CM | POA: Diagnosis not present

## 2018-09-20 DIAGNOSIS — E1122 Type 2 diabetes mellitus with diabetic chronic kidney disease: Secondary | ICD-10-CM

## 2018-09-20 DIAGNOSIS — IMO0002 Reserved for concepts with insufficient information to code with codable children: Secondary | ICD-10-CM

## 2018-09-20 MED ORDER — INSULIN ASPART 100 UNIT/ML FLEXPEN: 12.0000 [IU] | PEN_INJECTOR | Freq: Three times a day (TID) | SUBCUTANEOUS | 3 refills | Status: DC

## 2018-09-20 NOTE — Progress Notes (Signed)
Virtual Visit via Telephone Note: Attempted to connect via Video but camera was not working   I connected with [name] on 09/20/18 at  1:00 PM EDT by a video enabled telemedicine application and verified that I am speaking with the correct person using two identifiers.   I discussed the limitations of evaluation and management by telemedicine and the availability of in person appointments. The patient expressed understanding and agreed to proceed.   -Location of the patient : Home -Location of the provider : Office -The names of all persons participating in the telemedicine service : Pt and myself , daughter [name]        Name: [name]  Age/ Sex: 84 y.o., female   MRN/ DOB: [id number], 11-Oct-1934     PCP: [name], NP   Reason for Endocrinology Evaluation: Type 2 Diabetes Mellitus     Initial Endocrinology Clinic Visit: 08/19/2018    PATIENT IDENTIFIER: [name] is a 84 y.o. female with a past medical history of T2DM, CKD III, and chronic pain syndrome . The patient has followed with Endocrinology clinic since 08/19/2018 for consultative assistance with management of her diabetes.  DIABETIC HISTORY:  [name] was diagnosed with T2DM > 40 yrs ago. Pt is a poor historian and unable to recall oral glycemic agents. She has been on insulin therapy for years. Her hemoglobin A1c has ranged from 6.7%  in 07/2017, peaking at 12.1% in 2018.  SUBJECTIVE:   During the last visit (08/19/2018): Her A1c was 8.3%. We decreased Toujeo to once a day dosin and Increased Novolog to 10 units daily.   Today (09/20/2018): [name] is here for a 3 week virtual follow up visit on diabetes management. She checks her blood sugars through freestyle libre multiple  times daily. The patient has not had hypoglycemic episodes since the last clinic visit. Otherwise, the patient has not required any recent emergency interventions for hypoglycemia but she recently presented to the ED  with SOB.   ROS: As per HPI and as detailed below: Review of Systems  Constitutional: Negative for fever.  HENT: Negative for congestion and sore throat.   Respiratory: Positive for cough. Negative for shortness of breath.   Cardiovascular: Negative for chest pain and leg swelling.  Genitourinary: Negative for frequency.  Endo/Heme/Allergies: Negative for polydipsia.      HOME DIABETES REGIMEN:   Toujeo 40 units daily   Novolog 10 units TID   CF: BG -125/25 with meals     GLUCOSE LOG:   Date  Fasting  Bedtime  09/20/18 237   5/14 238 312  5/13 142 263  5/12 162 286      HISTORY:  Past Medical History:  Past Medical History:  Diagnosis Date  . Allergy    takes Mucinex daily as needed  . Anemia, unspecified   . Anxiety    takes Clonazepam daily as needed  . Arthritis   . CHF (congestive heart failure) (HCC)    takes Furosemide daily  . Coronary artery disease    a. s/p IMI 2004 tx with BMS to RCA;  b. s/p Promus DES to RCA 2/12 c. 06/2014 High risk stress test follow up cath 06/2014 showd patent stent--< med therapy for other mild to moedrate disease   . Coronary atherosclerosis of native coronary artery   . Depression    takes Cymbalta daily  . Diabetes mellitus    insulin daily  . Dysuria   . GERD (  gastroesophageal reflux disease)    takes Protonix daily  . Gout, unspecified    takes Colchicine daily  . Headache    occasionally  . History of blood clots about 31yrs ago   in legs-takes Coumadin   . Hyperlipidemia    takes Pravastatin daily  . Hypertension    takes Imdur and Metoprolol daily  . Insomnia   . Joint pain   . Joint swelling   . Myocardial infarction ([place]) 2004  . Numbness   . Obstructive sleep apnea   . Osteoarthritis   . Osteoarthritis   . Osteoarthrosis, unspecified whether generalized or localized, lower leg   . Pain, chronic   . Polymyalgia rheumatica ([place])   . Pulmonary emboli ([place]) 9/13   felt to need lifelong  anticoagulation  . Shortness of breath dyspnea    with exertion and has Albuterol inhaler prn  . Type II or unspecified type diabetes mellitus without mention of complication, uncontrolled   . Urinary incontinence    takes Linzess daily  . Vertigo    hx of;was taking Meclizine if needed    Past Surgical History:  Past Surgical History:  Procedure Laterality Date  . ABDOMINAL HYSTERECTOMY     partial  . APPENDECTOMY    . blood clots/legs and lungs  2013  . BREAST BIOPSY Left 07/22/2014  . BREAST BIOPSY Left 02/10/2013  . BREAST LUMPECTOMY Left 11/05/2014  . BREAST LUMPECTOMY WITH RADIOACTIVE SEED LOCALIZATION Left 11/05/2014   Procedure: LEFT BREAST LUMPECTOMY WITH RADIOACTIVE SEED LOCALIZATION;  Surgeon: [name], MD;  Location: [place];  Service: General;  Laterality: Left;  . CARDIAC CATHETERIZATION    . COLONOSCOPY    . CORONARY ANGIOPLASTY  2  . ESOPHAGOGASTRODUODENOSCOPY (EGD) WITH PROPOFOL N/A 11/07/2016   Procedure: ESOPHAGOGASTRODUODENOSCOPY (EGD) WITH PROPOFOL;  Surgeon: Gatha Mayer, MD;  Location: [facility];  Service: Endoscopy;  Laterality: N/A;  . EXCISION OF SKIN TAG Right 11/05/2014   Procedure: EXCISION OF RIGHT EYELID SKIN TAG;  Surgeon: [name], MD;  Location: [place];  Service: General;  Laterality: Right;  . EYE SURGERY Bilateral    cataract   . GASTRIC BYPASS  1977    reversed in 1979, [place] N/A 06/29/2014   Procedure: LEFT HEART CATHETERIZATION WITH CORONARY ANGIOGRAM;  Surgeon: Troy Sine, MD;  Location: [facility] CATH LAB;  Service: Cardiovascular;  Laterality: N/A;  . MI with stent placement  2004     Social History:  reports that she has never smoked. She has never used smokeless tobacco. She reports that she does not drink alcohol or use drugs. Family History:  Family History  Problem Relation Age of Onset  . Breast cancer Mother 76  . Heart disease Mother   . Throat cancer  Father   . Hypertension Father   . Arthritis Father   . Diabetes Father   . Arthritis Sister   . Obesity Sister   . Diabetes Sister   . Heart disease Cousin   . Colon cancer Neg Hx   . Stomach cancer Neg Hx   . Esophageal cancer Neg Hx       HOME MEDICATIONS: Allergies as of 09/20/2018      Reactions   Sulfonamide Derivatives Swelling   Mouth swelling   Tramadol Nausea And Vomiting   Aricept [donepezil Hcl]    GI upset/loose stools      Medication List       Accurate as  of Sep 20, 2018  8:27 AM. If you have any questions, ask your nurse or doctor.        albuterol 108 (90 Base) MCG/ACT inhaler Commonly known as:  VENTOLIN HFA Inhale 1-2 puffs into the lungs every 6 (six) hours as needed for wheezing or shortness of breath.   anastrozole 1 MG tablet Commonly known as:  ARIMIDEX TAKE 1 TABLET BY MOUTH EVERY DAY   budesonide-formoterol 160-4.5 MCG/ACT inhaler Commonly known as:  SYMBICORT Inhale 2 puffs into the lungs 2 (two) times daily. For bronchitis   cephALEXin 500 MG capsule Commonly known as:  KEFLEX Take 1 capsule (500 mg total) by mouth 2 (two) times daily.   clonazePAM 1 MG tablet Commonly known as:  KLONOPIN Take two tablets by mouth at bedtime as needed for anxiety   DULoxetine 60 MG capsule Commonly known as:  CYMBALTA Take one capsule by mouth once daily for anxiety   febuxostat 40 MG tablet Commonly known as:  Uloric Take 1 tablet (40 mg total) by mouth daily.   ferrous gluconate 324 MG tablet Commonly known as:  FERGON Take 1 tablet (324 mg total) by mouth daily with breakfast.   FreeStyle Libre 14 Day Reader [name] 1 Device by Does not apply route 4 (four) times daily -  before meals and at bedtime. For diabetes   FreeStyle Libre 14 Day Sensor Misc 1 Units by Does not apply route 4 (four) times daily -  before meals and at bedtime. For diabetes   furosemide 20 MG tablet Commonly known as:  LASIX Take 1 tablet (20 mg total) by mouth  daily as needed.   hydrALAZINE 50 MG tablet Commonly known as:  APRESOLINE TAKE 1 TABLET BY MOUTH 2 TIMES DAILY   insulin aspart 100 UNIT/ML FlexPen Commonly known as:  NovoLOG FlexPen Inject 10 Units into the skin 3 (three) times daily with meals. Max daily dose of 50 units   Insulin Glargine (2 Unit Dial) 300 UNIT/ML Sopn Commonly known as:  Toujeo Max SoloStar Inject 40 Units into the skin daily. Patient assistance program provides   isosorbide mononitrate 60 MG 24 hr tablet Commonly known as:  IMDUR Take one tablet by mouth once daily   meclizine 25 MG tablet Commonly known as:  ANTIVERT TAKE 1 TABLET BY MOUTH THREE TIMES DAILY AS NEEDED FOR DIZZINESS   memantine 5 MG tablet Commonly known as:  NAMENDA Take 2 tablets by mouth in the morning and 1 tablet in the evening  for memory   metoprolol succinate 25 MG 24 hr tablet Commonly known as:  TOPROL-XL Take one tablet by mouth once daily for blood pressure   nitroGLYCERIN 0.4 MG SL tablet Commonly known as:  Nitrostat Take one tablet under the tongue every 5 minutes as needed for chest pain   oxyCODONE-acetaminophen 10-325 MG tablet Commonly known as:  PERCOCET Take 1 tablet by mouth every 6 (six) hours.   pravastatin 20 MG tablet Commonly known as:  PRAVACHOL Take one tablet by mouth once daily for cholesterol   rivaroxaban 10 MG Tabs tablet Commonly known as:  Xarelto Take 1 tablet (10 mg total) by mouth daily.   sodium polystyrene 15 GM/60ML suspension Commonly known as:  KAYEXALATE 15 gm by mouth weekly to keep potassium down.         DATA REVIEWED:  Lab Results  Component Value Date   HGBA1C 8.3 (H) 07/17/2018   HGBA1C 10.2 (H) 03/11/2018   HGBA1C 9.8 (H) 10/09/2017  Lab Results  Component Value Date   MICROALBUR 2.9 06/14/2017   LDLCALC 40 07/17/2018   CREATININE 1.65 (H) 08/27/2018   Lab Results  Component Value Date   MICRALBCREAT 81 (H) 06/14/2017     Lab Results  Component Value  Date   CHOL 90 07/17/2018   HDL 35 (L) 07/17/2018   LDLCALC 40 07/17/2018   TRIG 74 07/17/2018   CHOLHDL 2.6 07/17/2018         ASSESSMENT / PLAN / RECOMMENDATIONS:   1)Type 2 Diabetes Mellitus, Sub-Optimally controlled, With CKD III and macrovascular complications - Most recent A1c of 8.3 %. Goal A1c < 7.5 %.     - Her BG's have been fluctuating, she is trying to do better with snacking and avoiding sugar- sweetened beverages  - Her morning BG's have been acceptable but as the day progresses her hyperglycemia worsens, hence will increase prandial insulin as below - We will also simplify her correctional scale    MEDICATIONS:  Continue Toujeo 40 units daily   Increase Novolog to 12 units TID QAC  If Pre-Meal BG   Is >200 mg/dL, take 14 units TIDQAC  EDUCATION / INSTRUCTIONS:  BG monitoring instructions: Patient is instructed to check her blood sugars 4 times a day, before meals and bedtime.  Call [facility] if: BG persistently < 70 or > 300. . I reviewed the Rule of 15 for the treatment of hypoglycemia in detail with the patient. Literature supplied.     I discussed the assessment and treatment plan with the patient. The patient was provided an opportunity to ask questions and all were answered. The patient agreed with the plan and demonstrated an understanding of the instructions.   The patient was advised to call back or seek an in-person evaluation if the symptoms worsen or if the condition fails to improve as anticipated.  I have spent 6 minutes with the patient, > 50% of the time was spent in education and counseling as above [address] F/U in 3 months    Signed electronically by: Mack Guise, MD  [facility]  [facility] [address] [place], [place] [address] Phone: [phone] FAX: [phone]   CC: [name], NP [place] [address] Phone: [phone]  Fax:  [phone]  Return to Endocrinology clinic as below: Future Appointments  Date Time Provider [facility]  09/20/2018  1:00 PM Jayquan Bradsher, [name], MD [facility] None  10/17/2018 11:00 AM [name], NP [facility] None  11/04/2018 11:40 AM [facility] DIAG TOMO 1 [facility] GI-BREAST CE  11/12/2018 11:30 AM [facility] LAB 4 [facility] None  11/12/2018 12:00 PM [name], MD [facility] None  04/14/2019  1:30 PM [name], NP [facility] None

## 2018-09-25 ENCOUNTER — Telehealth: Payer: Self-pay | Admitting: *Deleted

## 2018-09-25 MED ORDER — MECLIZINE HCL 25 MG PO TABS: ORAL_TABLET | ORAL | 0 refills | Status: DC

## 2018-09-25 NOTE — Telephone Encounter (Signed)
Patient requested refill for Meclizine. Takes as needed.

## 2018-10-01 DIAGNOSIS — H43391 Other vitreous opacities, right eye: Secondary | ICD-10-CM | POA: Diagnosis not present

## 2018-10-01 DIAGNOSIS — H43821 Vitreomacular adhesion, right eye: Secondary | ICD-10-CM | POA: Diagnosis not present

## 2018-10-01 DIAGNOSIS — H35371 Puckering of macula, right eye: Secondary | ICD-10-CM | POA: Diagnosis not present

## 2018-10-01 DIAGNOSIS — E119 Type 2 diabetes mellitus without complications: Secondary | ICD-10-CM | POA: Diagnosis not present

## 2018-10-09 ENCOUNTER — Other Ambulatory Visit: Payer: Self-pay | Admitting: Ophthalmology

## 2018-10-10 ENCOUNTER — Telehealth: Payer: Self-pay | Admitting: *Deleted

## 2018-10-10 NOTE — Telephone Encounter (Signed)
We will need documentation has to why she needs a hospital bed. We can do a televisit if they are agreeable.

## 2018-10-10 NOTE — Telephone Encounter (Signed)
TeleVisit Scheduled for tomorrow with [name].

## 2018-10-10 NOTE — Telephone Encounter (Signed)
[name] with [facility] called and stated that patient needs a order for a Hospital Bed faxed to [facility]. Stated patient does not want to use [facility]. Please Advise.

## 2018-10-11 ENCOUNTER — Other Ambulatory Visit: Payer: Self-pay

## 2018-10-11 ENCOUNTER — Ambulatory Visit (INDEPENDENT_AMBULATORY_CARE_PROVIDER_SITE_OTHER): Payer: PPO | Admitting: Nurse Practitioner

## 2018-10-11 ENCOUNTER — Ambulatory Visit: Payer: PPO | Admitting: Nurse Practitioner

## 2018-10-11 ENCOUNTER — Encounter: Payer: Self-pay | Admitting: Nurse Practitioner

## 2018-10-11 DIAGNOSIS — G894 Chronic pain syndrome: Secondary | ICD-10-CM

## 2018-10-11 DIAGNOSIS — M1A9XX1 Chronic gout, unspecified, with tophus (tophi): Secondary | ICD-10-CM | POA: Diagnosis not present

## 2018-10-11 DIAGNOSIS — C50112 Malignant neoplasm of central portion of left female breast: Secondary | ICD-10-CM | POA: Diagnosis not present

## 2018-10-11 DIAGNOSIS — E875 Hyperkalemia: Secondary | ICD-10-CM

## 2018-10-11 DIAGNOSIS — F332 Major depressive disorder, recurrent severe without psychotic features: Secondary | ICD-10-CM

## 2018-10-11 DIAGNOSIS — Z794 Long term (current) use of insulin: Secondary | ICD-10-CM

## 2018-10-11 DIAGNOSIS — I5032 Chronic diastolic (congestive) heart failure: Secondary | ICD-10-CM | POA: Diagnosis not present

## 2018-10-11 DIAGNOSIS — I2510 Atherosclerotic heart disease of native coronary artery without angina pectoris: Secondary | ICD-10-CM

## 2018-10-11 DIAGNOSIS — E1140 Type 2 diabetes mellitus with diabetic neuropathy, unspecified: Secondary | ICD-10-CM | POA: Diagnosis not present

## 2018-10-11 DIAGNOSIS — I10 Essential (primary) hypertension: Secondary | ICD-10-CM

## 2018-10-11 DIAGNOSIS — J449 Chronic obstructive pulmonary disease, unspecified: Secondary | ICD-10-CM

## 2018-10-11 DIAGNOSIS — I2583 Coronary atherosclerosis due to lipid rich plaque: Secondary | ICD-10-CM | POA: Diagnosis not present

## 2018-10-11 DIAGNOSIS — Z86718 Personal history of other venous thrombosis and embolism: Secondary | ICD-10-CM | POA: Diagnosis not present

## 2018-10-11 MED ORDER — SODIUM POLYSTYRENE SULFONATE 15 GM/60ML PO SUSP: ORAL | 2 refills | Status: DC

## 2018-10-11 MED ORDER — ALLOPURINOL 100 MG PO TABS: 100.0000 mg | ORAL_TABLET | Freq: Every day | ORAL | 6 refills | Status: DC

## 2018-10-11 NOTE — Progress Notes (Signed)
This service is provided via telemedicine  No vital signs collected/recorded due to the encounter was a telemedicine visit.   Location of patient (ex: home, work): Home  Patient consents to a telephone visit:  Yes  Location of the provider (ex: office, home): [facility], Office   Name of any referring provider: N/A  Names of all persons participating in the telemedicine service and their role in the encounter:  S.Chrae B/CMA, [name], NP, [name] (daughter) and Patient   Time spent on call: 12 min with medical assistant      Careteam: Patient Care Team: [name], NP as PCP - General (Geriatric Medicine) [name], [name], MD (Inactive) as Consulting Physician (Cardiology) [name], MD as Consulting Physician (Ophthalmology) [name], MD as Consulting Physician (General Surgery) [name], MD (Inactive) as Referring Physician (Obstetrics and Gynecology) [name] [name], MD as Consulting Physician (Ophthalmology)  Advanced Directive information Does Patient Have a Medical Advance Directive?: No  Allergies  Allergen Reactions  . Sulfonamide Derivatives Swelling    Mouth swelling  . Tramadol Nausea And Vomiting  . Aricept [Donepezil Hcl]     GI upset/loose stools    Chief Complaint  Patient presents with  . Medical Management of Chronic Issues    3 month follow-up, + depression screening, scored (19).  Discuss order request for Hospital Bed. Telephone visit   . Medication Management    Patient thinks she needs an iron supplement   . Medication Management    Discuss need for Kayexalate     HPI: Patient is a 84 y.o. female for routine follow up.   Depression/anxiety- just sits all day and does not do anything. Staying up at night and sleeping during the day. Taking cymbalta 60 mg daily and was not taking klonopin 1 mg by mouth at bedtime as needed for anxiety but just started back.  Has not couseling. Would be interested in  counseling.   Anemia- ferrous gluconate on medication list but she has not been taking.   Hyperkalemia- has not been taking kayexalate, did not know to continue this.   DM- has been following with endocrinologist at this time due to uncontrolled blood sugars with significant variation. Reports blood sugars will be high, going into the 300s. She is taking toujeo 40 units daily but has NOT increased novolog to 12 units with meals. States she was unaware of that change.   Gout- no recent flares, continues on uloric- however medication is too expensive.   Hypertension/CAD/CHF- bp 149/69, 117/71no worsening edema noted at this time. Occasionally has chest pains, went to ED and was evaluated for this, work-up negative. Uses ntg PRN which helps but reports pains also improve without the use of ntg. She has not followed up with her cardiologist recently. Taking metoprolol 25 mg daily, imdur 60 mg daily , hydralazine 50 mg by mouth twice daily   COPD- controlled, continues on Symbicort 2 puff twice daily, has albuterol PRN   Would like a hospital bed due to the fact that she has trouble with mobility. Can not get up by her self. Needs something that helps her get up and lay down.  Needs something to raise her head. Currently has one but does not function.  Also has discomfort when she sleeps, having to change positions frequently and unable to do this in a regular bed.  Has to raise the head of the bed up to sleep comfortably due to CHF and COPD.  Also  needing bed to raise feet up to help with edema.       Review of Systems:  Review of Systems  Constitutional: Negative for chills, fever and weight loss.  HENT: Negative for tinnitus.   Respiratory: Negative for cough, sputum production and shortness of breath.   Cardiovascular: Positive for chest pain (on and off chronically). Negative for palpitations and leg swelling.  Gastrointestinal: Negative for abdominal pain, constipation, diarrhea and  heartburn.  Genitourinary: Negative for dysuria, frequency and urgency.  Musculoskeletal: Positive for back pain, joint pain and myalgias. Negative for falls.  Skin: Negative.   Neurological: Negative for dizziness and headaches.  Psychiatric/Behavioral: Positive for depression. Negative for memory loss. The patient is nervous/anxious and has insomnia.     Past Medical History:  Diagnosis Date  . Allergy    takes Mucinex daily as needed  . Anemia, unspecified   . Anxiety    takes Clonazepam daily as needed  . Arthritis   . CHF (congestive heart failure) (HCC)    takes Furosemide daily  . Coronary artery disease    a. s/p IMI 2004 tx with BMS to RCA;  b. s/p Promus DES to RCA 2/12 c. 06/2014 High risk stress test follow up cath 06/2014 showd patent stent--< med therapy for other mild to moedrate disease   . Coronary atherosclerosis of native coronary artery   . Depression    takes Cymbalta daily  . Diabetes mellitus    insulin daily  . Dysuria   . GERD (gastroesophageal reflux disease)    takes Protonix daily  . Gout, unspecified    takes Colchicine daily  . Headache    occasionally  . History of blood clots about 18yrs ago   in legs-takes Coumadin   . Hyperlipidemia    takes Pravastatin daily  . Hypertension    takes Imdur and Metoprolol daily  . Insomnia   . Joint pain   . Joint swelling   . Myocardial infarction ([place]) 2004  . Numbness   . Obstructive sleep apnea   . Osteoarthritis   . Osteoarthritis   . Osteoarthrosis, unspecified whether generalized or localized, lower leg   . Pain, chronic   . Polymyalgia rheumatica ([place])   . Pulmonary emboli ([place]) 9/13   felt to need lifelong anticoagulation  . Shortness of breath dyspnea    with exertion and has Albuterol inhaler prn  . Type II or unspecified type diabetes mellitus without mention of complication, uncontrolled   . Urinary incontinence    takes Linzess daily  . Vertigo    hx of;was taking Meclizine if needed    Past Surgical History:  Procedure Laterality Date  . ABDOMINAL HYSTERECTOMY     partial  . APPENDECTOMY    . blood clots/legs and lungs  2013  . BREAST BIOPSY Left 07/22/2014  . BREAST BIOPSY Left 02/10/2013  . BREAST LUMPECTOMY Left 11/05/2014  . BREAST LUMPECTOMY WITH RADIOACTIVE SEED LOCALIZATION Left 11/05/2014   Procedure: LEFT BREAST LUMPECTOMY WITH RADIOACTIVE SEED LOCALIZATION;  Surgeon: [name], MD;  Location: [place];  Service: General;  Laterality: Left;  . CARDIAC CATHETERIZATION    . COLONOSCOPY    . CORONARY ANGIOPLASTY  2  . ESOPHAGOGASTRODUODENOSCOPY (EGD) WITH PROPOFOL N/A 11/07/2016   Procedure: ESOPHAGOGASTRODUODENOSCOPY (EGD) WITH PROPOFOL;  Surgeon: Gatha Mayer, MD;  Location: [facility];  Service: Endoscopy;  Laterality: N/A;  . EXCISION OF SKIN TAG Right 11/05/2014   Procedure: EXCISION OF RIGHT EYELID SKIN TAG;  Surgeon: Nathaneil Canary  Ninfa Linden, MD;  Location: [place];  Service: General;  Laterality: Right;  . EYE SURGERY Bilateral    cataract   . GASTRIC BYPASS  1977    reversed in 1979, [place] N/A 06/29/2014   Procedure: LEFT HEART CATHETERIZATION WITH CORONARY ANGIOGRAM;  Surgeon: Troy Sine, MD;  Location: [facility] CATH LAB;  Service: Cardiovascular;  Laterality: N/A;  . MI with stent placement  2004   Social History:   reports that she has never smoked. She has never used smokeless tobacco. She reports that she does not drink alcohol or use drugs.  Family History  Problem Relation Age of Onset  . Breast cancer Mother 42  . Heart disease Mother   . Throat cancer Father   . Hypertension Father   . Arthritis Father   . Diabetes Father   . Arthritis Sister   . Obesity Sister   . Diabetes Sister   . Heart disease Cousin   . Colon cancer Neg Hx   . Stomach cancer Neg Hx   . Esophageal cancer Neg Hx     Medications: Patient's Medications  New Prescriptions   No medications on file   Previous Medications   ALBUTEROL (PROVENTIL HFA;VENTOLIN HFA) 108 (90 BASE) MCG/ACT INHALER    Inhale 1-2 puffs into the lungs every 6 (six) hours as needed for wheezing or shortness of breath.   ANASTROZOLE (ARIMIDEX) 1 MG TABLET    TAKE 1 TABLET BY MOUTH EVERY DAY   BUDESONIDE-FORMOTEROL (SYMBICORT) 160-4.5 MCG/ACT INHALER    Inhale 2 puffs into the lungs 2 (two) times daily. For bronchitis   CLONAZEPAM (KLONOPIN) 1 MG TABLET    Take two tablets by mouth at bedtime as needed for anxiety   CONTINUOUS BLOOD GLUC RECEIVER (FREESTYLE LIBRE 14 DAY READER) DEVI    1 Device by Does not apply route 4 (four) times daily -  before meals and at bedtime. For diabetes   CONTINUOUS BLOOD GLUC SENSOR (FREESTYLE LIBRE 14 DAY SENSOR) MISC    1 Units by Does not apply route 4 (four) times daily -  before meals and at bedtime. For diabetes   DULOXETINE (CYMBALTA) 60 MG CAPSULE    Take one capsule by mouth once daily for anxiety   FEBUXOSTAT (ULORIC) 40 MG TABLET    Take 1 tablet (40 mg total) by mouth daily.   FUROSEMIDE (LASIX) 20 MG TABLET    Take 1 tablet (20 mg total) by mouth daily as needed.   HYDRALAZINE (APRESOLINE) 50 MG TABLET    TAKE 1 TABLET BY MOUTH 2 TIMES DAILY   INSULIN ASPART (NOVOLOG FLEXPEN) 100 UNIT/ML FLEXPEN    Inject 12 Units into the skin 3 (three) times daily with meals. Max daily dose of 45 units   INSULIN GLARGINE, 2 UNIT DIAL, (TOUJEO MAX SOLOSTAR) 300 UNIT/ML SOPN    Inject 40 Units into the skin daily. Patient assistance program provides   ISOSORBIDE MONONITRATE (IMDUR) 60 MG 24 HR TABLET    Take one tablet by mouth once daily   MECLIZINE (ANTIVERT) 25 MG TABLET    Take one tablet by mouth three times daily as needed for diziness   MEMANTINE (NAMENDA) 5 MG TABLET    Take 2 tablets by mouth in the morning and 1 tablet in the evening  for memory   METOPROLOL SUCCINATE (TOPROL-XL) 25 MG 24 HR TABLET    Take one tablet by mouth once daily for blood pressure  NITROGLYCERIN (NITROSTAT) 0.4  MG SL TABLET    Take one tablet under the tongue every 5 minutes as needed for chest pain   OXYCODONE-ACETAMINOPHEN (PERCOCET) 10-325 MG TABLET    Take 1 tablet by mouth every 6 (six) hours.   PRAVASTATIN (PRAVACHOL) 20 MG TABLET    Take one tablet by mouth once daily for cholesterol   RIVAROXABAN (XARELTO) 10 MG TABS TABLET    Take 1 tablet (10 mg total) by mouth daily.   SODIUM POLYSTYRENE (KAYEXALATE) 15 GM/60ML SUSPENSION    15 gm by mouth weekly to keep potassium down.  Modified Medications   No medications on file  Discontinued Medications   CEPHALEXIN (KEFLEX) 500 MG CAPSULE    Take 1 capsule (500 mg total) by mouth 2 (two) times daily.   FERROUS GLUCONATE (FERGON) 324 MG TABLET    Take 1 tablet (324 mg total) by mouth daily with breakfast.    Physical Exam:  There were no vitals filed for this visit. There is no height or weight on file to calculate BMI. Wt Readings from Last 3 Encounters:  07/17/18 (!) 323 lb (146.5 kg)  07/03/18 (!) 323 lb (146.5 kg)  06/03/18 (!) 324 lb (147 kg)      Labs reviewed: Basic Metabolic Panel: Recent Labs    10/24/17 1227  07/31/18 1059 08/06/18 1122 08/27/18 1355  NA 133*   < > 138 137 136  K 5.4*   < > 5.9* 5.3 5.1  CL 98   < > 106 106 105  CO2 27   < > 26 26 20*  GLUCOSE 211*   < > 117* 159* 267*  BUN 37*   < > 48* 48* 44*  CREATININE 1.86*   < > 2.05* 1.68* 1.65*  CALCIUM 8.8   < > 8.8 8.7 9.1  MG 1.4*  --   --   --   --    < > = values in this interval not displayed.   Liver Function Tests: Recent Labs    11/27/17 1326 03/11/18 1557 05/28/18 1324 08/27/18 1355  AST 10* 11 10* 16  ALT 9 8 7 14   ALKPHOS 89  --  80 70  BILITOT 0.3 0.2 0.4 0.8  PROT 8.4* 7.3 7.9 7.9  ALBUMIN 3.0*  --  3.0* 2.9*   Recent Labs    08/27/18 1355  LIPASE 39   No results for input(s): AMMONIA in the last 8760 hours. CBC: Recent Labs    03/11/18 1557 05/28/18 1324 08/27/18 1355  WBC 11.7* 10.2 13.3*  NEUTROABS 8,213* 7.1 10.0*   HGB 10.1* 9.4* 10.3*  HCT 30.9* 31.1* 33.4*  MCV 88.3 95.7 95.4  PLT 290 270 281   Lipid Panel: Recent Labs    07/17/18 1141  CHOL 90  HDL 35*  LDLCALC 40  TRIG 74  CHOLHDL 2.6   TSH: No results for input(s): TSH in the last 8760 hours. A1C: Lab Results  Component Value Date   HGBA1C 8.3 (H) 07/17/2018     Assessment/Plan 1. Coronary artery disease due to lipid rich plaque -pt with recurrent chest pains, uses ntg PRN, recently went to Ed where workup was negative.  She has had ongoing trips to ED and admission noted due to chest pains. no further ischemic work up planned when she saw cardiologist a year ago. recommended manage medically - not on aspirin due to anticoagulation. Discussed follow up with cardiologist at this time.   2. Hyperkalemia Pt with CKD  and hyperkalemia, not taking kayexalate weekly. Stressed importance of medication compliance and educated on why she is taking.  - sodium polystyrene (KAYEXALATE) 15 GM/60ML suspension; 15 gm by mouth weekly to keep potassium down.  Dispense: 473 mL; Refill: 2  3. Type 2 diabetes mellitus with diabetic neuropathy, with long-term current use of insulin (HCC) Was unaware she was supposed to increase her novolog to 12 units with meals after last visit with endocrinologist but plans to do so now. Continues on toujeo 40 units daily  Continue on dietary modification.   4. Chronic pain syndrome Stable, continues on oxycodone-apap every 6 hours as needed for pain.   5. Essential hypertension Stable on recent reading. Encouraged dietary compliance   6. Chronic obstructive pulmonary disease, unspecified COPD type ([place]) Stable at this time, continues on Symbicort with albuterol PRN  7. Chronic diastolic congestive heart failure (HCC) Stable, encouraged follow up with cardiologist, has not followed in a year. Recommended follow up at this time.   8. Malignant neoplasm of central portion of left female breast, unspecified  estrogen receptor status ([place]) Continues on anastrozole 1 mg daily   9. Chronic gout with tophus, unspecified cause, unspecified site uloric is becoming too pricey, will stop and have her take allopurinol at this time.  - allopurinol (ZYLOPRIM) 100 MG tablet; Take 1 tablet (100 mg total) by mouth daily.  Dispense: 30 tablet; Refill: 6 - Uric acid  10. History of deep venous thrombosis Continues on xarelto with bleeding or bruising noted.   11. Severe episode of recurrent major depressive disorder, without psychotic features ([place]) Continues on cymbalta 60 mg daily, encouraged routine during the day with activity and to get outside so she is not napping a lot during the day. Exercise is limited due to chronic pain. -encouraged to make appt with counsellor and she is willing to do so. Information provided. Denies HI or SI   Ms Linarez needs a hospital bed because their medical conditions (such as her CHF, COPD and chronic pain) requires positioning of the bed in ways not feasible with ordinary bed. pillows and wedges have been considered and will not allow the correct positioning for the patient. This resident requires frequent changes in their positioning due to physical condition and a hospital bed will alleviate this problem. Will order hospital bed at this time.   Next appt: 3 month follow up [place]. Atia Haupt, [facility] [phone]    Virtual Visit via Telephone Note (unable to do video visit due to Internet connection)  I connected with Ms Quarry on 10/11/18 at  3:15 PM EDT by telephone and verified that I am speaking with the correct person using two identifiers.  Location: Patient: home Provider: office   I discussed the limitations, risks, security and privacy concerns of performing an evaluation and management service by telephone and the availability of in person appointments. I also discussed with the patient that there may be a patient  responsible charge related to this service. The patient expressed understanding and agreed to proceed.   I discussed the assessment and treatment plan with the patient. The patient was provided an opportunity to ask questions and all were answered. The patient agreed with the plan and demonstrated an understanding of the instructions.   The patient was advised to call back or seek an in-person evaluation if the symptoms worsen or if the condition fails to improve as anticipated.  I provided 27 minutes of non-face-to-face time during  this encounter.  [employer]. Harle Battiest Avs printed and mailed

## 2018-10-11 NOTE — Addendum Note (Signed)
Addended by: [name] on: 10/11/2018 03:39 PM   Modules accepted: Orders

## 2018-10-11 NOTE — Patient Instructions (Addendum)
Continue Kayexelate weekly to help reduce potassium   Continue daily iron supplement for anemia (ferrous sulfate 325 mg daily is OTC)  STOP uloric when complete and start allopurinol 100 mg by mouth daily, follow up uric acid level before next visit.   Encourage you to get outside, increase daytime activity, less napping during the day so you can sleep at night.  We have enclosed information on counselors to help with depression and anxiety  Continue cymbalta for anxiety and depression  To make appt with cardiologist due to ongoing chest pains.    Insomnia Insomnia is a sleep disorder that makes it difficult to fall asleep or stay asleep. Insomnia can cause fatigue, low energy, difficulty concentrating, mood swings, and poor performance at work or school. There are three different ways to classify insomnia:  Difficulty falling asleep.  Difficulty staying asleep.  Waking up too early in the morning. Any type of insomnia can be long-term (chronic) or short-term (acute). Both are common. Short-term insomnia usually lasts for three months or less. Chronic insomnia occurs at least three times a week for longer than three months. What are the causes? Insomnia may be caused by another condition, situation, or substance, such as:  Anxiety.  Certain medicines.  Gastroesophageal reflux disease (GERD) or other gastrointestinal conditions.  Asthma or other breathing conditions.  Restless legs syndrome, sleep apnea, or other sleep disorders.  Chronic pain.  Menopause.  Stroke.  Abuse of alcohol, tobacco, or illegal drugs.  Mental health conditions, such as depression.  Caffeine.  Neurological disorders, such as Alzheimer's disease.  An overactive thyroid (hyperthyroidism). Sometimes, the cause of insomnia may not be known. What increases the risk? Risk factors for insomnia include:  Gender. Women are affected more often than men.  Age. Insomnia is more common as you get  older.  Stress.  Lack of exercise.  Irregular work schedule or working night shifts.  Traveling between different time zones.  Certain medical and mental health conditions. What are the signs or symptoms? If you have insomnia, the main symptom is having trouble falling asleep or having trouble staying asleep. This may lead to other symptoms, such as:  Feeling fatigued or having low energy.  Feeling nervous about going to sleep.  Not feeling rested in the morning.  Having trouble concentrating.  Feeling irritable, anxious, or depressed. How is this diagnosed? This condition may be diagnosed based on:  Your symptoms and medical history. Your health care provider may ask about: ? Your sleep habits. ? Any medical conditions you have. ? Your mental health.  A physical exam. How is this treated? Treatment for insomnia depends on the cause. Treatment may focus on treating an underlying condition that is causing insomnia. Treatment may also include:  Medicines to help you sleep.  Counseling or therapy.  Lifestyle adjustments to help you sleep better. Follow these instructions at home: Eating and drinking   Limit or avoid alcohol, caffeinated beverages, and cigarettes, especially close to bedtime. These can disrupt your sleep.  Do not eat a large meal or eat spicy foods right before bedtime. This can lead to digestive discomfort that can make it hard for you to sleep. Sleep habits   Keep a sleep diary to help you and your health care provider figure out what could be causing your insomnia. Write down: ? When you sleep. ? When you wake up during the night. ? How well you sleep. ? How rested you feel the next day. ? Any side effects of  medicines you are taking. ? What you eat and drink.  Make your bedroom a dark, comfortable place where it is easy to fall asleep. ? Put up shades or blackout curtains to block light from outside. ? Use a white noise machine to block  noise. ? Keep the temperature cool.  Limit screen use before bedtime. This includes: ? Watching TV. ? Using your smartphone, tablet, or computer.  Stick to a routine that includes going to bed and waking up at the same times every day and night. This can help you fall asleep faster. Consider making a quiet activity, such as reading, part of your nighttime routine.  Try to avoid taking naps during the day so that you sleep better at night.  Get out of bed if you are still awake after 15 minutes of trying to sleep. Keep the lights down, but try reading or doing a quiet activity. When you feel sleepy, go back to bed. General instructions  Take over-the-counter and prescription medicines only as told by your health care provider.  Exercise regularly, as told by your health care provider. Avoid exercise starting several hours before bedtime.  Use relaxation techniques to manage stress. Ask your health care provider to suggest some techniques that may work well for you. These may include: ? Breathing exercises. ? Routines to release muscle tension. ? Visualizing peaceful scenes.  Make sure that you drive carefully. Avoid driving if you feel very sleepy.  Keep all follow-up visits as told by your health care provider. This is important. Contact a health care provider if:  You are tired throughout the day.  You have trouble in your daily routine due to sleepiness.  You continue to have sleep problems, or your sleep problems get worse. Get help right away if:  You have serious thoughts about hurting yourself or someone else. If you ever feel like you may hurt yourself or others, or have thoughts about taking your own life, get help right away. You can go to your nearest emergency department or call:  Your local emergency services (911 in the [place]  A suicide crisis helpline, such as the [place] at (586) 141-8079. This is open 24 hours a  day. Summary  Insomnia is a sleep disorder that makes it difficult to fall asleep or stay asleep.  Insomnia can be long-term (chronic) or short-term (acute).  Treatment for insomnia depends on the cause. Treatment may focus on treating an underlying condition that is causing insomnia.  Keep a sleep diary to help you and your health care provider figure out what could be causing your insomnia. This information is not intended to replace advice given to you by your health care provider. Make sure you discuss any questions you have with your health care provider. Document Released: 04/21/2000 Document Revised: 02/01/2017 Document Reviewed: 02/01/2017 Elsevier Interactive Patient Education  2019 [employer].

## 2018-10-11 NOTE — Telephone Encounter (Signed)
Faxed the following to [facility] @ [phone]:  Demographics  Insurance Card Last office visit note Hospital bed order  S.Chrae B/CMA

## 2018-10-15 ENCOUNTER — Telehealth: Payer: Self-pay | Admitting: *Deleted

## 2018-10-15 NOTE — Telephone Encounter (Signed)
Received fax order from [facility] for [facility].  Placed order in [facility] folder to review and sign.

## 2018-10-17 ENCOUNTER — Ambulatory Visit: Payer: PPO | Admitting: Nurse Practitioner

## 2018-10-19 ENCOUNTER — Other Ambulatory Visit (HOSPITAL_COMMUNITY)
Admission: RE | Admit: 2018-10-19 | Discharge: 2018-10-19 | Disposition: A | Discharge status: Home / Self Care | Payer: PPO | Source: Ambulatory Visit | Attending: Ophthalmology | Admitting: Ophthalmology

## 2018-10-19 DIAGNOSIS — Z1159 Encounter for screening for other viral diseases: Secondary | ICD-10-CM | POA: Diagnosis not present

## 2018-10-21 ENCOUNTER — Other Ambulatory Visit: Payer: Self-pay | Admitting: *Deleted

## 2018-10-21 ENCOUNTER — Inpatient Hospital Stay (HOSPITAL_COMMUNITY): Admission: RE | Admit: 2018-10-21 | Payer: PPO | Source: Ambulatory Visit

## 2018-10-21 DIAGNOSIS — G894 Chronic pain syndrome: Secondary | ICD-10-CM

## 2018-10-21 LAB — NOVEL CORONAVIRUS, NAA (HOSP ORDER, SEND-OUT TO REF LAB; TAT 18-24 HRS): SARS-CoV-2, NAA: NOT DETECTED

## 2018-10-21 MED ORDER — OXYCODONE-ACETAMINOPHEN 10-325 MG PO TABS: 1.0000 | ORAL_TABLET | Freq: Four times a day (QID) | ORAL | 0 refills | Status: DC

## 2018-10-21 NOTE — Telephone Encounter (Signed)
Patient requested refill [place] Database Verified LR: 09/18/2018 Pended Rx and sent to [facility] for approval.

## 2018-10-22 ENCOUNTER — Other Ambulatory Visit: Payer: Self-pay | Admitting: Nurse Practitioner

## 2018-10-22 ENCOUNTER — Encounter (HOSPITAL_COMMUNITY): Payer: Self-pay | Admitting: *Deleted

## 2018-10-22 ENCOUNTER — Other Ambulatory Visit: Payer: Self-pay

## 2018-10-22 DIAGNOSIS — J449 Chronic obstructive pulmonary disease, unspecified: Secondary | ICD-10-CM | POA: Diagnosis not present

## 2018-10-22 DIAGNOSIS — I5030 Unspecified diastolic (congestive) heart failure: Secondary | ICD-10-CM | POA: Diagnosis not present

## 2018-10-22 DIAGNOSIS — G4733 Obstructive sleep apnea (adult) (pediatric): Secondary | ICD-10-CM | POA: Diagnosis not present

## 2018-10-22 NOTE — Progress Notes (Signed)
Pt [name]-Pre-op call completed by pt and pt daughter, [name]) via speaker phone. Pt denies SOB and chest pain. Daughter stated that pt is under the care of Truitt Merle, NP, Cardiology. Daughter denies that mother C/O recent chest pain and stated that pt only used Nitro once in the last year " it was so long ago-I can't remember. "  Daughter stated that pt was instructed to see NP " in one year ."  Daughter stated that pt was not provided with pre-op instructions regarding Xarelto. Nurse advised daughter to call surgeon for instructions; nurse lvm with surgeon's nurse to f/u with pre-op Xarelto instructions and orders; awaiting a return call. Daughter denies that pt had recent labs. Daughter made aware to have pt check BG every 2 hours prior to arrival to hospital on DOS, if BG > 70 have pt take 20 units of Toujeo insulin. Pt made aware to treat a BG < 70 with 4 ounces of apple juice, wait 15 minutes after intervention to recheck BG, if BG remains < 70, call Short Stay unit to speak with a nurse. Daughter made aware to have pt stop taking  Aspirin (unless otherwise advised by surgeon), vitamins, fish oil and herbal medications. Do not take any NSAIDs ie: Ibuprofen, Advil, Naproxen (Aleve), Motrin, BC and Goody Powder.  Daughter denies that pt and family tested positive for COVID-19 ( pt had negative result on 10/19/2018 and daughter reminded to have pt quarantine  Daughter denies that pt and family experienced the following symptoms:  Cough yes/no: No Fever (>100.3F)  yes/no: No Runny nose yes/no: No Sore throat yes/no: No Difficulty breathing/shortness of breath  yes/no: No  Have you or a family member traveled in the last 14 days and where? yes/no: No  Daughter reminded that hospital visitation restrictions are in effect and the importance of the restrictions.   Daughter verbalized understanding of all pre-op instructions.  PA, Anesthesiology, asked to review pt history; see note.

## 2018-10-22 NOTE — Progress Notes (Signed)
Anesthesia Chart Review: SAME DAY WORK-UP   Case: [id number] Date/Time: 10/23/18 1215   Procedures:      PARS PLANA VITRECTOMY WITH 25 GAUGE (Right )     MEMBRANE PEEL (Right )   Anesthesia type: IV Sedation ([facility] Only)   Pre-op diagnosis: EPIRETINAL MEMBRANE RIGHT EYE   Location: [facility] OR ROOM 08 / [place] OR   Surgeon: Hurman Horn, MD      DISCUSSION: Patient is an 84 year old female scheduled for the above procedure.  History includes never smoker, HLD, HTN, CAD (s/p diaphragmatic wall MI 11/2002 s/p BMS RCA; s/p DES x2 mid and proximal RCA 06/22/10), OSA, polymyalgia rheumatica, DVT/PE (2013), anemia, chronic diastolic CHF, chronic pain, dyspnea, GERD, DM2, obesity (s/p gastric bypass '77), left breast cancer (s/p left  breast lumpectomy 11/05/14). CKD (stage III).  Notes also suggest some at least mild cognitive impairment and that she is essentially wheelchair bond. Daughters are involved in her care.   - ED visit 08/27/18 for SOB. Discharged on increased Lasix for two days for mild CHF exacerbation and on 10 day course of doxycycline for concern for occult pneumonia (no consolidation on 1V CXR). - Sent to ED from [facility] on 07/03/18 for dizziness and chest pain during second dose of IV feraheme. Symptoms resolved, so she left without being seen. - ED visit 02/16/18 for acute cystitis. - [facility] admission 10/08/17-10/10/17 for acute on chronic diastolic CHF. Also developed chest pain. Cardiologist Mertie Moores, MD consulted. History difficult given some baseline dementia and chest pain history not felt reliable. Troponins negative, LVEF normal by echo. No ischemic evaluation felt warranted. CHF improved with diuresis.  Last seen by PCP [name], NP on 10/11/18. Last seen at [facility] 10/30/17 for hospital follow-up for CHF/chest pain in which no additional ischemic testing recommended at that time. Per PAT RN Esther's phone interview with patient and daughters, no recent chest pain or  new SOB. Patient may have used Nitro once in the past year, but so long ago can't remember when. Daughters state they are due to schedule patient's yearly follow-up. Patient is on Xarelto (I believe more for PE/DVT history rather than CAD). Patient's daughters to clarify with surgeon if this needs to be held for surgery. Sherlynn Stalls also contacted surgeon's office.  10/19/18 presurgical COVID 19 test was negative. Surgery is posted for IV sedation. Patient is a same day work-up, so further evaluation on the day of surgery. No reported acute CV/HF symptoms.   PROVIDERS: [name], NP is PCP  - Collier Flowers, MD is endocrinologist. Last evaluation on 09/20/18.  Ena Dawley, MD is cardiologist. Last visit 10/30/17 with Truitt Merle, NP for 10/2017 post-hospital acute on chronic diastolic CHF and chest pain follow-up.  Sullivan Lone, MD is HEM-ONC. Last visit 05/28/08. His note indicates that patient has presumed presumed stage IA (pT1c,pNx[cN0], cM0) invasive ductal carcinoma grade 2 out of 3 of the left breast. She had residual DCIS at the margins, and has had stable surveillance mammograms and is on Arimidex.  Donato Heinz, MD is nephrologist. Last visit 09/18/18.    LABS: She will need updated labs on arrival. Last lab show: Lab Results  Component Value Date   WBC 13.3 (H) 08/27/2018   HGB 10.3 (L) 08/27/2018   HCT 33.4 (L) 08/27/2018   PLT 281 08/27/2018   GLUCOSE 267 (H) 08/27/2018   ALT 14 08/27/2018   AST 16 08/27/2018   NA 136 08/27/2018   K 5.1  08/27/2018   CL 105 08/27/2018   CREATININE 1.65 (H) 08/27/2018   BUN 44 (H) 08/27/2018   CO2 20 (L) 08/27/2018   HGBA1C 8.3 (H) 07/17/2018    IMAGES: 1V CXR 08/27/18: FINDINGS: Lungs are clear. Heart is upper normal in size with pulmonary vascularity normal. No adenopathy. No bone lesions. IMPRESSION: No edema or consolidation.  Stable cardiac silhouette.   EKG: 08/27/18: Sinus rhythm Borderline left axis  deviation RSR' in V1 or V2, right VCD or RVH Abnormal inferior Q waves No significant change since last tracing Confirmed by Theotis Burrow [phone]) on 08/27/2018 5:22:03 PM   CV: Echo 10/09/17: Study Conclusions - Procedure narrative: Transthoracic echocardiography. The study   was technically difficult, as a result of body habitus. - Left ventricle: The cavity size was normal. Wall thickness was   increased in a pattern of moderate LVH. There was severe   concentric hypertrophy. Systolic function was vigorous. The   estimated ejection fraction was in the range of 65% to 70%.   Doppler parameters are consistent with abnormal left ventricular   relaxation (grade 1 diastolic dysfunction). The E/e&' ratio is   between 8-15, suggesting indeterminate LV filling pressure. - Mitral valve: Poorly visualized. Calcified annulus. There was no   significant regurgitation. - Left atrium: The atrium was normal in size. - Inferior vena cava: The vessel was normal in size. The   respirophasic diameter changes were in the normal range (>= 50%),   consistent with normal central venous pressure. Impressions: - Technically difficult study. Compared to a prior study in 07/2017,   the LVEF is higher at 65-70%.  Cardiac cath 06/29/14: IMPRESSION: - Normal global LV function with mild mid inferior hypocontractility. - Coronary calcification with smooth 20% proximal narrowing followed by 30% stenosis before the first diagonal branch, 50-60% stenosis in the proximal diagonal 1 vessel with 30% mid LAD smooth narrowing; 20% smooth stenosis in the proximal left circumflex: Artery, and widely patent proximal and mid RCA stents without significant RCA stenoses. RECOMMENDATION: Medical therapy.  Nuclear stress test 06/26/14:  IMPRESSION: 1. Moderate inferior ischemia and mild anteroapical ischemia. 2. Inferior wall hypokinesis. 3. Left ventricular ejection fraction 51% 4. High-risk stress test findings*.  Suggestive of multivessel CAD. - Referred for LHC (done 06/29/14, medical therapy)   Past Medical History:  Diagnosis Date  . Allergy    takes Mucinex daily as needed  . Anemia, unspecified   . Anxiety    takes Clonazepam daily as needed  . Arthritis   . Cancer (HCC)    breast  . CHF (congestive heart failure) ([place])    takes Furosemide daily  . CKD (chronic kidney disease)   . Coronary artery disease    a. s/p IMI 2004 tx with BMS to RCA;  b. s/p Promus DES to RCA 2/12 c. 06/2014 High risk stress test follow up cath 06/2014 showd patent stent--< med therapy for other mild to moedrate disease   . Coronary atherosclerosis of native coronary artery   . Depression    takes Cymbalta daily  . Diabetes mellitus    insulin daily  . Dysuria   . GERD (gastroesophageal reflux disease)    takes Protonix daily  . Gout, unspecified    takes Colchicine daily  . Headache    occasionally  . History of blood clots about 27yrs ago   in legs-takes Coumadin   . Hyperlipidemia    takes Pravastatin daily  . Hypertension    takes Imdur and Metoprolol daily  .  Insomnia   . Joint pain   . Joint swelling   . Myocardial infarction ([place]) 2004  . Numbness   . Obstructive sleep apnea    does not wear cpap  . Osteoarthritis   . Osteoarthritis   . Osteoarthrosis, unspecified whether generalized or localized, lower leg   . Pain, chronic   . Polymyalgia rheumatica ([place])   . Pulmonary emboli ([place]) 9/13   felt to need lifelong anticoagulation  . Shortness of breath dyspnea    with exertion and has Albuterol inhaler prn  . Type II or unspecified type diabetes mellitus without mention of complication, uncontrolled   . Urinary incontinence    takes Linzess daily  . Vertigo    hx of;was taking Meclizine if needed  . Wears dentures     Past Surgical History:  Procedure Laterality Date  . ABDOMINAL HYSTERECTOMY     partial  . APPENDECTOMY    . blood clots/legs and lungs  2013  . BREAST BIOPSY  Left 07/22/2014  . BREAST BIOPSY Left 02/10/2013  . BREAST LUMPECTOMY Left 11/05/2014  . BREAST LUMPECTOMY WITH RADIOACTIVE SEED LOCALIZATION Left 11/05/2014   Procedure: LEFT BREAST LUMPECTOMY WITH RADIOACTIVE SEED LOCALIZATION;  Surgeon: [name], MD;  Location: [facility];  Service: General;  Laterality: Left;  . CARDIAC CATHETERIZATION    . COLONOSCOPY    . CORONARY ANGIOPLASTY  2  . ESOPHAGOGASTRODUODENOSCOPY (EGD) WITH PROPOFOL N/A 11/07/2016   Procedure: ESOPHAGOGASTRODUODENOSCOPY (EGD) WITH PROPOFOL;  Surgeon: Gatha Mayer, MD;  Location: [facility];  Service: Endoscopy;  Laterality: N/A;  . EXCISION OF SKIN TAG Right 11/05/2014   Procedure: EXCISION OF RIGHT EYELID SKIN TAG;  Surgeon: [name], MD;  Location: [place];  Service: General;  Laterality: Right;  . EYE SURGERY Bilateral    cataract   . GASTRIC BYPASS  1977    reversed in 1979, [place] N/A 06/29/2014   Procedure: LEFT HEART CATHETERIZATION WITH CORONARY ANGIOGRAM;  Surgeon: Troy Sine, MD;  Location: [facility] CATH LAB;  Service: Cardiovascular;  Laterality: N/A;  . MI with stent placement  2004    MEDICATIONS: No current facility-administered medications for this encounter.    [name] albuterol (PROVENTIL HFA;VENTOLIN HFA) 108 (90 Base) MCG/ACT inhaler  . allopurinol (ZYLOPRIM) 100 MG tablet  . anastrozole (ARIMIDEX) 1 MG tablet  . budesonide-formoterol (SYMBICORT) 160-4.5 MCG/ACT inhaler  . clonazePAM (KLONOPIN) 1 MG tablet  . DULoxetine (CYMBALTA) 60 MG capsule  . febuxostat (ULORIC) 40 MG tablet  . furosemide (LASIX) 20 MG tablet  . hydrALAZINE (APRESOLINE) 50 MG tablet  . insulin aspart (NOVOLOG FLEXPEN) 100 UNIT/ML FlexPen  . Insulin Glargine, 2 Unit Dial, (TOUJEO MAX SOLOSTAR) 300 UNIT/ML SOPN  . isosorbide mononitrate (IMDUR) 60 MG 24 hr tablet  . magnesium oxide (MAG-OX) 400 MG tablet  . meclizine (ANTIVERT) 25 MG tablet  . memantine  (NAMENDA) 5 MG tablet  . metoprolol succinate (TOPROL-XL) 25 MG 24 hr tablet  . nitroGLYCERIN (NITROSTAT) 0.4 MG SL tablet  . pantoprazole (PROTONIX) 40 MG tablet  . pravastatin (PRAVACHOL) 20 MG tablet  . rivaroxaban (XARELTO) 10 MG TABS tablet  . sodium polystyrene (KAYEXALATE) 15 GM/60ML suspension  . Continuous Blood Gluc Receiver (FREESTYLE LIBRE 14 DAY READER) DEVI  . Continuous Blood Gluc Sensor (FREESTYLE LIBRE 14 DAY SENSOR) MISC  . oxyCODONE-acetaminophen (PERCOCET) 10-325 MG tablet    Myra Gianotti, PA-C Surgical Short Stay/Anesthesiology [facility] Phone [phone] [facility] Phone [phone]  10/22/2018 12:41 PM

## 2018-10-23 ENCOUNTER — Ambulatory Visit (HOSPITAL_COMMUNITY)
Admission: RE | Admit: 2018-10-23 | Discharge: 2018-10-23 | Disposition: A | Discharge status: Home / Self Care | Payer: PPO | Attending: Ophthalmology | Admitting: Ophthalmology

## 2018-10-23 ENCOUNTER — Encounter (HOSPITAL_COMMUNITY)
Admission: RE | Disposition: A | Discharge status: Home / Self Care | Payer: Self-pay | Source: Home / Self Care | Attending: Ophthalmology

## 2018-10-23 ENCOUNTER — Encounter (HOSPITAL_COMMUNITY): Payer: Self-pay | Admitting: Certified Registered Nurse Anesthetist

## 2018-10-23 ENCOUNTER — Other Ambulatory Visit: Payer: Self-pay

## 2018-10-23 ENCOUNTER — Ambulatory Visit (HOSPITAL_COMMUNITY): Payer: PPO | Admitting: Vascular Surgery

## 2018-10-23 DIAGNOSIS — K219 Gastro-esophageal reflux disease without esophagitis: Secondary | ICD-10-CM | POA: Insufficient documentation

## 2018-10-23 DIAGNOSIS — F419 Anxiety disorder, unspecified: Secondary | ICD-10-CM | POA: Diagnosis not present

## 2018-10-23 DIAGNOSIS — M353 Polymyalgia rheumatica: Secondary | ICD-10-CM | POA: Diagnosis not present

## 2018-10-23 DIAGNOSIS — Z955 Presence of coronary angioplasty implant and graft: Secondary | ICD-10-CM | POA: Insufficient documentation

## 2018-10-23 DIAGNOSIS — H5789 Other specified disorders of eye and adnexa: Secondary | ICD-10-CM | POA: Diagnosis not present

## 2018-10-23 DIAGNOSIS — N183 Chronic kidney disease, stage 3 (moderate): Secondary | ICD-10-CM | POA: Diagnosis not present

## 2018-10-23 DIAGNOSIS — E669 Obesity, unspecified: Secondary | ICD-10-CM | POA: Insufficient documentation

## 2018-10-23 DIAGNOSIS — Z79899 Other long term (current) drug therapy: Secondary | ICD-10-CM | POA: Diagnosis not present

## 2018-10-23 DIAGNOSIS — E1165 Type 2 diabetes mellitus with hyperglycemia: Secondary | ICD-10-CM | POA: Diagnosis not present

## 2018-10-23 DIAGNOSIS — E1122 Type 2 diabetes mellitus with diabetic chronic kidney disease: Secondary | ICD-10-CM | POA: Diagnosis not present

## 2018-10-23 DIAGNOSIS — I10 Essential (primary) hypertension: Secondary | ICD-10-CM | POA: Diagnosis not present

## 2018-10-23 DIAGNOSIS — Z794 Long term (current) use of insulin: Secondary | ICD-10-CM | POA: Insufficient documentation

## 2018-10-23 DIAGNOSIS — Z6841 Body Mass Index (BMI) 40.0 and over, adult: Secondary | ICD-10-CM | POA: Insufficient documentation

## 2018-10-23 DIAGNOSIS — E785 Hyperlipidemia, unspecified: Secondary | ICD-10-CM | POA: Diagnosis not present

## 2018-10-23 DIAGNOSIS — M109 Gout, unspecified: Secondary | ICD-10-CM | POA: Diagnosis not present

## 2018-10-23 DIAGNOSIS — G3184 Mild cognitive impairment, so stated: Secondary | ICD-10-CM | POA: Diagnosis not present

## 2018-10-23 DIAGNOSIS — I130 Hypertensive heart and chronic kidney disease with heart failure and stage 1 through stage 4 chronic kidney disease, or unspecified chronic kidney disease: Secondary | ICD-10-CM | POA: Insufficient documentation

## 2018-10-23 DIAGNOSIS — F329 Major depressive disorder, single episode, unspecified: Secondary | ICD-10-CM | POA: Diagnosis not present

## 2018-10-23 DIAGNOSIS — D631 Anemia in chronic kidney disease: Secondary | ICD-10-CM | POA: Insufficient documentation

## 2018-10-23 DIAGNOSIS — I5032 Chronic diastolic (congestive) heart failure: Secondary | ICD-10-CM | POA: Insufficient documentation

## 2018-10-23 DIAGNOSIS — E1151 Type 2 diabetes mellitus with diabetic peripheral angiopathy without gangrene: Secondary | ICD-10-CM | POA: Diagnosis not present

## 2018-10-23 DIAGNOSIS — I2510 Atherosclerotic heart disease of native coronary artery without angina pectoris: Secondary | ICD-10-CM | POA: Diagnosis not present

## 2018-10-23 DIAGNOSIS — H43821 Vitreomacular adhesion, right eye: Secondary | ICD-10-CM | POA: Diagnosis not present

## 2018-10-23 DIAGNOSIS — R32 Unspecified urinary incontinence: Secondary | ICD-10-CM | POA: Diagnosis not present

## 2018-10-23 DIAGNOSIS — I110 Hypertensive heart disease with heart failure: Secondary | ICD-10-CM | POA: Diagnosis not present

## 2018-10-23 DIAGNOSIS — G4733 Obstructive sleep apnea (adult) (pediatric): Secondary | ICD-10-CM | POA: Insufficient documentation

## 2018-10-23 DIAGNOSIS — J449 Chronic obstructive pulmonary disease, unspecified: Secondary | ICD-10-CM | POA: Insufficient documentation

## 2018-10-23 DIAGNOSIS — I252 Old myocardial infarction: Secondary | ICD-10-CM | POA: Diagnosis not present

## 2018-10-23 DIAGNOSIS — Z86718 Personal history of other venous thrombosis and embolism: Secondary | ICD-10-CM | POA: Insufficient documentation

## 2018-10-23 DIAGNOSIS — H35371 Puckering of macula, right eye: Secondary | ICD-10-CM | POA: Insufficient documentation

## 2018-10-23 DIAGNOSIS — Z853 Personal history of malignant neoplasm of breast: Secondary | ICD-10-CM | POA: Insufficient documentation

## 2018-10-23 DIAGNOSIS — Z993 Dependence on wheelchair: Secondary | ICD-10-CM | POA: Insufficient documentation

## 2018-10-23 HISTORY — DX: Presence of dental prosthetic device (complete) (partial): Z97.2

## 2018-10-23 HISTORY — PX: PARS PLANA VITRECTOMY: SHX2166

## 2018-10-23 HISTORY — PX: MEMBRANE PEEL: SHX5967

## 2018-10-23 LAB — CBC
HCT: 36.6 % (ref 36.0–46.0)
Hemoglobin: 11.2 g/dL — ABNORMAL LOW (ref 12.0–15.0)
MCH: 29.3 pg (ref 26.0–34.0)
MCHC: 30.6 g/dL (ref 30.0–36.0)
MCV: 95.8 fL (ref 80.0–100.0)
Platelets: 325 10*3/uL (ref 150–400)
RBC: 3.82 MIL/uL — ABNORMAL LOW (ref 3.87–5.11)
RDW: 13.1 % (ref 11.5–15.5)
WBC: 13.9 10*3/uL — ABNORMAL HIGH (ref 4.0–10.5)
nRBC: 0 % (ref 0.0–0.2)

## 2018-10-23 LAB — GLUCOSE, CAPILLARY
Glucose-Capillary: 187 mg/dL — ABNORMAL HIGH (ref 70–99)
Glucose-Capillary: 203 mg/dL — ABNORMAL HIGH (ref 70–99)
Glucose-Capillary: 209 mg/dL — ABNORMAL HIGH (ref 70–99)

## 2018-10-23 LAB — BASIC METABOLIC PANEL
Anion gap: 11 (ref 5–15)
BUN: 39 mg/dL — ABNORMAL HIGH (ref 8–23)
CO2: 23 mmol/L (ref 22–32)
Calcium: 9.2 mg/dL (ref 8.9–10.3)
Chloride: 104 mmol/L (ref 98–111)
Creatinine, Ser: 1.55 mg/dL — ABNORMAL HIGH (ref 0.44–1.00)
GFR calc Af Amer: 36 mL/min — ABNORMAL LOW (ref >60)
GFR calc non Af Amer: 31 mL/min — ABNORMAL LOW (ref >60)
Glucose, Bld: 212 mg/dL — ABNORMAL HIGH (ref 70–99)
Potassium: 4.4 mmol/L (ref 3.5–5.1)
Sodium: 138 mmol/L (ref 135–145)

## 2018-10-23 LAB — PROTIME-INR
INR: 1.2 (ref 0.8–1.2)
Prothrombin Time: 15.4 seconds — ABNORMAL HIGH (ref 11.4–15.2)

## 2018-10-23 SURGERY — PARS PLANA VITRECTOMY WITH 25 GAUGE
Anesthesia: General | Site: Eye | Laterality: Right

## 2018-10-23 MED ORDER — DEXTROSE 5 % IV SOLN
INTRAVENOUS | Status: AC | PRN
  Administered 2018-10-23: 4.5 mL via INTRAVENOUS

## 2018-10-23 MED ORDER — CYCLOPENTOLATE HCL 1 % OP SOLN
1.0000 [drp] | OPHTHALMIC | Status: AC | PRN
  Administered 2018-10-23 (×3): 1 [drp] via OPHTHALMIC

## 2018-10-23 MED ORDER — GATIFLOXACIN 0.5 % OP SOLN
OPHTHALMIC | Status: AC
  Administered 2018-10-23: 1 [drp] via OPHTHALMIC
  Filled 2018-10-23: qty 2.5

## 2018-10-23 MED ORDER — INDOCYANINE GREEN 25 MG IV SOLR
INTRAVENOUS | Status: DC | PRN
  Administered 2018-10-23: 1.25 mg

## 2018-10-23 MED ORDER — ROCURONIUM BROMIDE 10 MG/ML (PF) SYRINGE
PREFILLED_SYRINGE | INTRAVENOUS | Status: AC
  Filled 2018-10-23: qty 10

## 2018-10-23 MED ORDER — PROPOFOL 10 MG/ML IV BOLUS
INTRAVENOUS | Status: DC | PRN
  Administered 2018-10-23: 110 mg via INTRAVENOUS

## 2018-10-23 MED ORDER — ONDANSETRON HCL 4 MG/2ML IJ SOLN
INTRAMUSCULAR | Status: AC
  Filled 2018-10-23: qty 2

## 2018-10-23 MED ORDER — PHENYLEPHRINE HCL (PRESSORS) 10 MG/ML IV SOLN
INTRAVENOUS | Status: DC | PRN
  Administered 2018-10-23 (×2): 80 ug via INTRAVENOUS
  Administered 2018-10-23: 120 ug via INTRAVENOUS
  Administered 2018-10-23: 80 ug via INTRAVENOUS

## 2018-10-23 MED ORDER — SODIUM CHLORIDE 0.9 % IV SOLN
INTRAVENOUS | Status: DC | PRN
  Administered 2018-10-23: 13:00:00 via INTRAVENOUS

## 2018-10-23 MED ORDER — EPINEPHRINE PF 1 MG/ML IJ SOLN
INTRAMUSCULAR | Status: AC
  Filled 2018-10-23: qty 1

## 2018-10-23 MED ORDER — HYPROMELLOSE (GONIOSCOPIC) 2.5 % OP SOLN
OPHTHALMIC | Status: AC
  Filled 2018-10-23: qty 15

## 2018-10-23 MED ORDER — SODIUM CHLORIDE (PF) 0.9 % IJ SOLN
INTRAMUSCULAR | Status: AC
  Filled 2018-10-23: qty 10

## 2018-10-23 MED ORDER — SUCCINYLCHOLINE CHLORIDE 200 MG/10ML IV SOSY
PREFILLED_SYRINGE | INTRAVENOUS | Status: AC
  Filled 2018-10-23: qty 10

## 2018-10-23 MED ORDER — HYPROMELLOSE (GONIOSCOPIC) 2.5 % OP SOLN
OPHTHALMIC | Status: DC | PRN
  Administered 2018-10-23: 1 [drp] via OPHTHALMIC

## 2018-10-23 MED ORDER — CYCLOPENTOLATE HCL 1 % OP SOLN
OPHTHALMIC | Status: AC
  Administered 2018-10-23: 1 [drp] via OPHTHALMIC
  Filled 2018-10-23: qty 2

## 2018-10-23 MED ORDER — BSS PLUS IO SOLN
INTRAOCULAR | Status: AC
  Filled 2018-10-23: qty 500

## 2018-10-23 MED ORDER — DEXAMETHASONE SODIUM PHOSPHATE 10 MG/ML IJ SOLN
INTRAMUSCULAR | Status: DC | PRN
  Administered 2018-10-23: 10 mg

## 2018-10-23 MED ORDER — SUCCINYLCHOLINE CHLORIDE 200 MG/10ML IV SOSY
PREFILLED_SYRINGE | INTRAVENOUS | Status: DC | PRN
  Administered 2018-10-23: 140 mg via INTRAVENOUS

## 2018-10-23 MED ORDER — STERILE WATER FOR INJECTION IJ SOLN
INTRAMUSCULAR | Status: DC | PRN
  Administered 2018-10-23: 14:00:00 10 mL

## 2018-10-23 MED ORDER — SODIUM HYALURONATE 10 MG/ML IO SOLN
INTRAOCULAR | Status: AC
  Filled 2018-10-23: qty 0.85

## 2018-10-23 MED ORDER — PHENYLEPHRINE HCL 2.5 % OP SOLN
1.0000 [drp] | OPHTHALMIC | Status: AC | PRN
  Administered 2018-10-23 (×3): 1 [drp] via OPHTHALMIC

## 2018-10-23 MED ORDER — LIDOCAINE 2% (20 MG/ML) 5 ML SYRINGE
INTRAMUSCULAR | Status: DC | PRN
  Administered 2018-10-23: 80 mg via INTRAVENOUS

## 2018-10-23 MED ORDER — FENTANYL CITRATE (PF) 250 MCG/5ML IJ SOLN
INTRAMUSCULAR | Status: AC
  Filled 2018-10-23: qty 5

## 2018-10-23 MED ORDER — ACETAMINOPHEN 500 MG PO TABS
1000.0000 mg | ORAL_TABLET | Freq: Once | ORAL | Status: AC
  Administered 2018-10-23: 1000 mg via ORAL
  Filled 2018-10-23: qty 2

## 2018-10-23 MED ORDER — EPINEPHRINE PF 1 MG/ML IJ SOLN
INTRAOCULAR | Status: DC | PRN
  Administered 2018-10-23: 500 mL

## 2018-10-23 MED ORDER — DEXAMETHASONE SODIUM PHOSPHATE 10 MG/ML IJ SOLN
INTRAMUSCULAR | Status: AC
  Filled 2018-10-23: qty 1

## 2018-10-23 MED ORDER — POLYMYXIN B SULFATE 500000 UNITS IJ SOLR
INTRAMUSCULAR | Status: AC
  Filled 2018-10-23: qty 500000

## 2018-10-23 MED ORDER — FENTANYL CITRATE (PF) 100 MCG/2ML IJ SOLN: 25.0000 ug | INTRAMUSCULAR | Status: DC | PRN

## 2018-10-23 MED ORDER — SODIUM CHLORIDE 0.9 % IV SOLN
INTRAVENOUS | Status: DC | PRN
  Administered 2018-10-23: 50 ug/min via INTRAVENOUS

## 2018-10-23 MED ORDER — LIDOCAINE 2% (20 MG/ML) 5 ML SYRINGE
INTRAMUSCULAR | Status: AC
  Filled 2018-10-23: qty 5

## 2018-10-23 MED ORDER — ONDANSETRON HCL 4 MG/2ML IJ SOLN: 4.0000 mg | Freq: Once | INTRAMUSCULAR | Status: DC | PRN

## 2018-10-23 MED ORDER — GATIFLOXACIN 0.5 % OP SOLN
1.0000 [drp] | OPHTHALMIC | Status: AC | PRN
  Administered 2018-10-23 (×3): 1 [drp] via OPHTHALMIC

## 2018-10-23 MED ORDER — PHENYLEPHRINE 40 MCG/ML (10ML) SYRINGE FOR IV PUSH (FOR BLOOD PRESSURE SUPPORT)
PREFILLED_SYRINGE | INTRAVENOUS | Status: AC
  Filled 2018-10-23: qty 20

## 2018-10-23 MED ORDER — GENTAMICIN SULFATE 40 MG/ML IJ SOLN
INTRAMUSCULAR | Status: AC
  Filled 2018-10-23: qty 2

## 2018-10-23 MED ORDER — FENTANYL CITRATE (PF) 250 MCG/5ML IJ SOLN
INTRAMUSCULAR | Status: DC | PRN
  Administered 2018-10-23: 75 ug via INTRAVENOUS
  Administered 2018-10-23 (×3): 25 ug via INTRAVENOUS

## 2018-10-23 MED ORDER — PHENYLEPHRINE HCL 2.5 % OP SOLN
OPHTHALMIC | Status: AC
  Administered 2018-10-23: 1 [drp] via OPHTHALMIC
  Filled 2018-10-23: qty 2

## 2018-10-23 MED ORDER — BSS IO SOLN
INTRAOCULAR | Status: DC | PRN
  Administered 2018-10-23: 15 mL via INTRAOCULAR

## 2018-10-23 MED ORDER — INDOCYANINE GREEN 25 MG IV SOLR
INTRAVENOUS | Status: AC
  Filled 2018-10-23: qty 25

## 2018-10-23 MED ORDER — LIDOCAINE HCL 2 % IJ SOLN
INTRAMUSCULAR | Status: AC
  Filled 2018-10-23: qty 20

## 2018-10-23 MED ORDER — BACITRACIN-POLYMYXIN B 500-10000 UNIT/GM OP OINT
TOPICAL_OINTMENT | OPHTHALMIC | Status: AC
  Filled 2018-10-23: qty 3.5

## 2018-10-23 MED ORDER — NA CHONDROIT SULF-NA HYALURON 40-30 MG/ML IO SOLN
INTRAOCULAR | Status: AC
  Filled 2018-10-23: qty 0.5

## 2018-10-23 MED ORDER — LIDOCAINE HCL 2 % IJ SOLN
INTRAMUSCULAR | Status: DC | PRN
  Administered 2018-10-23: 10 mL

## 2018-10-23 MED ORDER — PROPOFOL 10 MG/ML IV BOLUS
INTRAVENOUS | Status: AC
  Filled 2018-10-23: qty 20

## 2018-10-23 SURGICAL SUPPLY — 60 items
APL SRG 3 HI ABS STRL LF PLS (MISCELLANEOUS) ×1
APL SWBSTK 6 STRL LF DISP (MISCELLANEOUS) ×1
APPLICATOR COTTON TIP 6 STRL (MISCELLANEOUS) ×1 IMPLANT
APPLICATOR COTTON TIP 6IN STRL (MISCELLANEOUS) ×3 IMPLANT
APPLICATOR DR MATTHEWS STRL (MISCELLANEOUS) ×3 IMPLANT
BLADE 10 SAFETY STRL DISP (BLADE) ×3 IMPLANT
BLADE MVR KNIFE 20G (BLADE) IMPLANT
CABLE BIPOLOR RESECTION CORD (MISCELLANEOUS) ×3 IMPLANT
CANNULA ANT CHAM MAIN (OPHTHALMIC RELATED) IMPLANT
CANNULA VLV SOFT TIP 25GA (OPHTHALMIC) ×3 IMPLANT
CORD BIPOLAR FORCEPS 12FT (ELECTRODE) IMPLANT
CORDS BIPOLAR (ELECTRODE) IMPLANT
COVER MAYO STAND STRL (DRAPES) IMPLANT
COVER WAND RF STERILE (DRAPES) ×3 IMPLANT
DRAPE INCISE 51X51 W/FILM STRL (DRAPES) IMPLANT
DRAPE OPHTHALMIC 77X100 STRL (CUSTOM PROCEDURE TRAY) ×3 IMPLANT
FILTER BLUE MILLIPORE (MISCELLANEOUS) IMPLANT
FORCEPS ECKARDT ILM 25G SERR (OPHTHALMIC RELATED) IMPLANT
FORCEPS GRIESHABER ILM 25G A (INSTRUMENTS) ×3 IMPLANT
FORCEPS HORIZONTAL 25G DISP (OPHTHALMIC RELATED) IMPLANT
GAS AUTO FILL CONSTEL (OPHTHALMIC)
GAS AUTO FILL CONSTELLATION (OPHTHALMIC) IMPLANT
GLOVE SS BIOGEL STRL SZ 8.5 (GLOVE) ×1 IMPLANT
GLOVE SUPERSENSE BIOGEL SZ 8.5 (GLOVE) ×2
GOWN STRL REUS W/ TWL LRG LVL3 (GOWN DISPOSABLE) ×1 IMPLANT
GOWN STRL REUS W/ TWL XL LVL3 (GOWN DISPOSABLE) ×1 IMPLANT
GOWN STRL REUS W/TWL LRG LVL3 (GOWN DISPOSABLE) ×3
GOWN STRL REUS W/TWL XL LVL3 (GOWN DISPOSABLE) ×3
KIT BASIN OR (CUSTOM PROCEDURE TRAY) ×3 IMPLANT
KNIFE CRESCENT 2.5 55 ANG (BLADE) IMPLANT
LENS BIOM SUPER VIEW SET DISP (MISCELLANEOUS) ×3 IMPLANT
MICROPICK 25G (MISCELLANEOUS)
NEEDLE 18GX1X1/2 (RX/OR ONLY) (NEEDLE) IMPLANT
NEEDLE 25GX 5/8IN NON SAFETY (NEEDLE) IMPLANT
NEEDLE FILTER BLUNT 18X 1/2SAF (NEEDLE)
NEEDLE FILTER BLUNT 18X1 1/2 (NEEDLE) IMPLANT
NEEDLE HYPO 25GX1X1/2 BEV (NEEDLE) IMPLANT
NS IRRIG 1000ML POUR BTL (IV SOLUTION) ×3 IMPLANT
PACK FRAGMATOME (OPHTHALMIC) IMPLANT
PACK VITRECTOMY CUSTOM (CUSTOM PROCEDURE TRAY) ×3 IMPLANT
PAD ARMBOARD 7.5X6 YLW CONV (MISCELLANEOUS) ×6 IMPLANT
PAK PIK VITRECTOMY CVS 25GA (OPHTHALMIC) ×3 IMPLANT
PENCIL BIPOLAR 25GA STR DISP (OPHTHALMIC RELATED) IMPLANT
PICK MICROPICK 25G (MISCELLANEOUS) IMPLANT
PROBE LASER ILLUM FLEX CVD 25G (OPHTHALMIC) IMPLANT
ROLLS DENTAL (MISCELLANEOUS) IMPLANT
SCRAPER DIAMOND 25GA (OPHTHALMIC RELATED) IMPLANT
SET INJECTOR OIL FLUID CONSTEL (OPHTHALMIC) IMPLANT
STOCKINETTE IMPERVIOUS 9X36 MD (GAUZE/BANDAGES/DRESSINGS) ×6 IMPLANT
STOPCOCK 4 WAY LG BORE MALE ST (IV SETS) IMPLANT
SUT ETHILON 10 0 CS140 6 (SUTURE) IMPLANT
SUT ETHILON 8 0 BV130 4 (SUTURE) IMPLANT
SUT MERSILENE 5 0 RD 1 DA (SUTURE) IMPLANT
SUT PROLENE 10 0 CIF 4 DA (SUTURE) IMPLANT
SUT VICRYL 7 0 TG140 8 (SUTURE) IMPLANT
SYR 30ML SLIP (SYRINGE) IMPLANT
SYR 5ML LL (SYRINGE) IMPLANT
SYR TB 1ML LUER SLIP (SYRINGE) IMPLANT
WATER STERILE IRR 1000ML POUR (IV SOLUTION) ×3 IMPLANT
WIPE INSTRUMENT VISIWIPE 73X73 (MISCELLANEOUS) ×3 IMPLANT

## 2018-10-23 NOTE — Op Note (Signed)
NAME: [name], [name] MEDICAL RECORD OY:[id number] ACCOUNT [id number] DATE OF BIRTH:[birth year] FACILITY: [facility] LOCATION: [facility] PHYSICIAN:Mauriana Dann Dorina Hoyer, MD  OPERATIVE REPORT  DATE OF PROCEDURE:  10/23/2018  PREOPERATIVE DIAGNOSIS:  Epiretinal membrane with severe macular topographic distortion of the right eye.  POSTOPERATIVE DIAGNOSIS:  Epiretinal membrane with severe macular topographic distortion of the right eye.  PROCEDURE: 1.  Posterior vitrectomy with membrane peel, internal limiting membrane peel. 2.  Surgical posterior capsulotomy, right eye.  SURGEON:  Deloria Lair, MD  ANESTHESIA:  General endotracheal anesthesia because of the patient's morbid obesity and inability to lie flat without shortness of breath.  INDICATION FOR PROCEDURE: The patient is an 84 year old woman who has a profound visual loss, the basis of severe topographic distortion of the macular region and foveal distortion, visual acuity of 20/200.  The patient understands this is an attempt to surgically remove the  epiretinal membrane so as to allow for improved visual acuity over the coming weeks to months ahead.  The patient understands the risks of anesthesia including overcorrection, difficulty to the eye from the underlying condition including but not limited to hemorrhage, infection, scarring, need for surgery, no change in vision, loss of vision and  progression of disease despite intervention.  The appropriate signed consent was obtained.  DESCRIPTION OF PROCEDURE:  The patient was taken to the operating room.  In the operating room and surgical timeout was carried out by the staff in the operative room and the operative surgeon.  General endotracheal anesthesia was induced without difficulty.  The right periocular region was then sterilely prepped and draped in the usual ophthalmic fashion.  Of note, the patient had been COVID negative testing prior to surgery.  At this time, a 25-gauge  trocar placed into the inferotemporal quadrant and its placement visualized and the infusion turned.  Superior trocars were applied.  Core vitrectomy was then begun using the microscope and the BIOM attachment.  Posterior hyaloid  was engaged and had to be elevated off the optic nerve off the posterior pole and finally trimmed 360 degrees and vitreous base shaved.  Anterior hyaloid confirmed to be removed.  During this portion of procedure that surgical capsulotomy was required  to allow for adequate visualization of the posterior pole and the macular region.  This was carried out with the vitrectomy instrumentation.  The visual axis was cleared nicely.  At this time, a diluted solution of ICG in D5W was then injected over the  macular region and adequate staining of the internal limiting membrane was identified.  There was no epiretinal tissues overlying the macular foveal region, but there was some welt near the arcades.  Internal limiting membrane forceps were then used to engage the ILM after excess ICG had been aspirated.  The internal limiting membrane not removed for a radius of 1.5-2 disk diameters around the center of the fovea 360 degrees.  No holes were  performed.  No complications occurred.  Remnants of the ILM were aspirated from the vitreous cavity.  Peripheral retina inspected and found to be free of retinal holes or tears.  No complications occurred.  At this time the goals of the surgery have been  accomplished.  Instruments removed from the eye.  The trocars were removed from the eye and infusion removed.  Subconjunctival Decadron applied.  Sterile patch and Fox shield applied.  The patient tolerated the procedure without complication and taken  to the PACU in good and stable condition.  TN/NUANCE  D:10/23/2018 T:10/23/2018 JOB:[id number]

## 2018-10-23 NOTE — H&P (Signed)
Patient is 84 year old woman profound vision loss in the right eye progressive from epiretinal membrane, macular pucker and foveal distortion.  Impression  1.   epiretinal membrane right eye  Plan #1  Posterior vitrectomy with membrane peel-internal limiting membrane peel right eye under local and monitored anesthesia control  Risk and benefits have been explained with the patient in the outpatient setting as well as today

## 2018-10-23 NOTE — Anesthesia Postprocedure Evaluation (Signed)
Anesthesia Post Note  Patient: [name]  Procedure(s) Performed: PARS PLANA VITRECTOMY WITH 25 GAUGE (Right Eye) MEMBRANE PEEL (Right Eye)     Patient location during evaluation: PACU Anesthesia Type: General Level of consciousness: awake and alert Pain management: pain level controlled Vital Signs Assessment: post-procedure vital signs reviewed and stable Respiratory status: spontaneous breathing, nonlabored ventilation, respiratory function stable and patient connected to nasal cannula oxygen Cardiovascular status: blood pressure returned to baseline and stable Postop Assessment: no apparent nausea or vomiting Anesthetic complications: no    Last Vitals:  Vitals:   10/23/18 1440 10/23/18 1455  BP: 140/66 139/66  Pulse: 84 88  Resp: 16 18  Temp: 36.6 C 36.8 C  SpO2: 98% 98%    Last Pain:  Vitals:   10/23/18 1440  TempSrc:   PainSc: 0-No pain                 Catalina Gravel

## 2018-10-23 NOTE — Brief Op Note (Signed)
Preoperative diagnosis  Epiretinal membrane right eye  Postoperative diagnosis #1 the same, epiretinal membrane  2.  Posterior capsule opacity  Surgeon Deloria Lair, MD  Anesthesia General endotracheal anesthesia  Indication for procedure patient has vision loss on the basis of macular topographic distortion from epiretinal membrane.  No complications  Procedure #1 posterior vitrectomy with membrane peel, internal limiting membrane right eye  2.  Surgical posterior capsulotomy right eye

## 2018-10-23 NOTE — Anesthesia Preprocedure Evaluation (Addendum)
Anesthesia Evaluation  Patient identified by MRN, date of birth, ID band Patient awake    Reviewed: Allergy & Precautions, NPO status , Patient's Chart, lab work & pertinent test results, reviewed documented beta blocker date and time   History of Anesthesia Complications Negative for: history of anesthetic complications  Airway Mallampati: III  TM Distance: >3 FB Neck ROM: Full    Dental  (+) Partial Lower, Upper Dentures   Pulmonary shortness of breath, with exertion, at rest and lying, asthma , sleep apnea and Continuous Positive Airway Pressure Ventilation , COPD,  COPD inhaler,    breath sounds clear to auscultation + decreased breath sounds      Cardiovascular hypertension, Pt. on medications and Pt. on home beta blockers + CAD, + Past MI, + Cardiac Stents, + Peripheral Vascular Disease, +CHF, + Orthopnea and + DOE  Normal cardiovascular exam Rhythm:Regular Rate:Normal  Hx/o PTE   Neuro/Psych  Headaches, PSYCHIATRIC DISORDERS Anxiety Depression EPIRETINAL MEMBRANE RIGHT EYE  Neuromuscular disease    GI/Hepatic Neg liver ROS, GERD  Medicated and Controlled,Dysphagia   Endo/Other  diabetes, Poorly Controlled, Type 2, Insulin DependentMorbid obesity  Renal/GU Renal InsufficiencyRenal disease Bladder dysfunction  Urinary incontinence    Musculoskeletal  (+) Arthritis , Osteoarthritis,  Gout   Abdominal (+) + obese,   Peds  Hematology  (+) Blood dyscrasia, anemia , On Coumadin   Anesthesia Other Findings Day of surgery medications reviewed with the patient.  Reproductive/Obstetrics                            Anesthesia Physical  Anesthesia Plan  ASA: III  Anesthesia Plan: General   Post-op Pain Management:    Induction: Intravenous  PONV Risk Score and Plan: 3 and Ondansetron and Dexamethasone  Airway Management Planned: Oral ETT  Additional Equipment:   Intra-op Plan:    Post-operative Plan: Extubation in OR  Informed Consent: I have reviewed the patients History and Physical, chart, labs and discussed the procedure including the risks, benefits and alternatives for the proposed anesthesia with the patient or authorized representative who has indicated his/her understanding and acceptance.     Dental advisory given  Plan Discussed with: Anesthesiologist, CRNA and Surgeon  Anesthesia Plan Comments:         Anesthesia Quick Evaluation

## 2018-10-23 NOTE — Transfer of Care (Signed)
Immediate Anesthesia Transfer of Care Note  Patient: [name]  Procedure(s) Performed: PARS PLANA VITRECTOMY WITH 25 GAUGE (Right Eye) MEMBRANE PEEL (Right Eye)  Patient Location: PACU  Anesthesia Type:General  Level of Consciousness: awake  Airway & Oxygen Therapy: Patient Spontanous Breathing and Patient connected to nasal cannula oxygen  Post-op Assessment: Report given to RN and Post -op Vital signs reviewed and stable  Post vital signs: Reviewed and stable  Last Vitals:  Vitals Value Taken Time  BP 133/58 10/23/18 1355  Temp    Pulse 86 10/23/18 1405  Resp 21 10/23/18 1405  SpO2 94 % 10/23/18 1405  Vitals shown include unvalidated device data.  Last Pain:  Vitals:   10/23/18 1044  TempSrc:   PainSc: 0-No pain      Patients Stated Pain Goal: 0 (38/37/79 [id number])  Complications: No apparent anesthesia complications

## 2018-10-23 NOTE — Anesthesia Procedure Notes (Signed)
Procedure Name: Intubation Date/Time: 10/23/2018 1:52 PM Performed by: Clearnce Sorrel, CRNA Pre-anesthesia Checklist: Patient identified, Emergency Drugs available, Suction available, Patient being monitored and Timeout performed Patient Re-evaluated:Patient Re-evaluated prior to induction Oxygen Delivery Method: Circle system utilized Preoxygenation: Pre-oxygenation with 100% oxygen Induction Type: IV induction and Rapid sequence Laryngoscope Size: Mac and 3 Grade View: Grade I Tube type: Oral Tube size: 7.0 mm Number of attempts: 1 Airway Equipment and Method: Stylet Placement Confirmation: ETT inserted through vocal cords under direct vision,  positive ETCO2 and breath sounds checked- equal and bilateral Secured at: 23 cm Tube secured with: Tape Dental Injury: Teeth and Oropharynx as per pre-operative assessment

## 2018-10-24 ENCOUNTER — Encounter (HOSPITAL_COMMUNITY): Payer: Self-pay | Admitting: Ophthalmology

## 2018-10-24 ENCOUNTER — Other Ambulatory Visit: Payer: Self-pay | Admitting: Nurse Practitioner

## 2018-10-28 ENCOUNTER — Telehealth: Payer: Self-pay | Admitting: *Deleted

## 2018-10-28 NOTE — Telephone Encounter (Signed)
Patient states having some pain in left breast and wants to know if she can see Dr.Kale sooner than appt on 7/7. States her mammogram is scheduled for 7/13 - should she have that before she sees Dr.Kale? Advised her that Dr.Kalee will be given information and office will contact her.

## 2018-10-29 DIAGNOSIS — E1351 Other specified diabetes mellitus with diabetic peripheral angiopathy without gangrene: Secondary | ICD-10-CM | POA: Diagnosis not present

## 2018-10-29 DIAGNOSIS — L602 Onychogryphosis: Secondary | ICD-10-CM | POA: Diagnosis not present

## 2018-10-29 NOTE — Telephone Encounter (Signed)
Contacted patient per Dr. Grier Mitts directions: Please get mammogram done sooner and he will reschedule her appt right after that.  Patient states she called and her mammogram on 7/13 can't be rescheduled so she wants to see Dr. Irene Limbo on 7/14 and cancel current appt on 7/7. Encouraged patient to let this office know if mammogram can be rescheduled and if so, will try to get her in to see Dr.Kale sooner. Patient verbalized understanding. Appt on 7/7 cancelled and message sent to [facility] scheduler to reschedule lab/MD appt

## 2018-10-30 ENCOUNTER — Other Ambulatory Visit: Payer: Self-pay

## 2018-10-30 ENCOUNTER — Telehealth: Payer: Self-pay | Admitting: Hematology

## 2018-10-30 ENCOUNTER — Telehealth: Payer: Self-pay | Admitting: Internal Medicine

## 2018-10-30 DIAGNOSIS — IMO0002 Reserved for concepts with insufficient information to code with codable children: Secondary | ICD-10-CM

## 2018-10-30 DIAGNOSIS — E1121 Type 2 diabetes mellitus with diabetic nephropathy: Secondary | ICD-10-CM

## 2018-10-30 DIAGNOSIS — H35371 Puckering of macula, right eye: Secondary | ICD-10-CM | POA: Diagnosis not present

## 2018-10-30 DIAGNOSIS — Z09 Encounter for follow-up examination after completed treatment for conditions other than malignant neoplasm: Secondary | ICD-10-CM | POA: Diagnosis not present

## 2018-10-30 MED ORDER — NOVOLOG FLEXPEN 100 UNIT/ML ~~LOC~~ SOPN: 12.0000 [IU] | PEN_INJECTOR | Freq: Three times a day (TID) | SUBCUTANEOUS | 3 refills | Status: DC

## 2018-10-30 NOTE — Telephone Encounter (Signed)
Scheduled appt per 6/24 sch message - spoke with patient . They are aware of appt date and time

## 2018-10-30 NOTE — Telephone Encounter (Signed)
MEDICATION: insulin aspart (NOVOLOG FLEXPEN) 100 UNIT/ML FlexPen  PHARMACY:  [facility]  IS THIS A 90 DAY SUPPLY :   IS PATIENT OUT OF MEDICATION:   IF NOT; HOW MUCH IS LEFT:   LAST APPOINTMENT DATE: @5 /15/2020  NEXT APPOINTMENT DATE:@8 /14/2020  DO WE HAVE YOUR PERMISSION TO LEAVE A DETAILED MESSAGE:  OTHER COMMENTS:  Patient is going out of town at 78  **Let patient know to contact pharmacy at the end of the day to make sure medication is ready. **  ** Please notify patient to allow 48-72 hours to process**  **Encourage patient to contact the pharmacy for refills or they can request refills through [facility]**

## 2018-10-31 NOTE — Telephone Encounter (Signed)
Patient is calling for an update on RX

## 2018-11-01 ENCOUNTER — Other Ambulatory Visit: Payer: Self-pay

## 2018-11-01 DIAGNOSIS — IMO0002 Reserved for concepts with insufficient information to code with codable children: Secondary | ICD-10-CM

## 2018-11-01 DIAGNOSIS — E1121 Type 2 diabetes mellitus with diabetic nephropathy: Secondary | ICD-10-CM

## 2018-11-01 MED ORDER — NOVOLOG FLEXPEN 100 UNIT/ML ~~LOC~~ SOPN: 12.0000 [IU] | PEN_INJECTOR | Freq: Three times a day (TID) | SUBCUTANEOUS | 3 refills | Status: DC

## 2018-11-01 NOTE — Telephone Encounter (Signed)
Refill sent to requested pharmacy, called to inform pt but no vm set up.

## 2018-11-07 ENCOUNTER — Other Ambulatory Visit: Payer: Self-pay | Admitting: Nurse Practitioner

## 2018-11-07 DIAGNOSIS — R413 Other amnesia: Secondary | ICD-10-CM

## 2018-11-12 ENCOUNTER — Ambulatory Visit: Payer: PPO | Admitting: Hematology

## 2018-11-12 ENCOUNTER — Other Ambulatory Visit: Payer: PPO

## 2018-11-19 ENCOUNTER — Other Ambulatory Visit: Payer: Self-pay | Admitting: *Deleted

## 2018-11-19 DIAGNOSIS — G894 Chronic pain syndrome: Secondary | ICD-10-CM

## 2018-11-19 MED ORDER — OXYCODONE-ACETAMINOPHEN 10-325 MG PO TABS: 1.0000 | ORAL_TABLET | Freq: Four times a day (QID) | ORAL | 0 refills | Status: DC

## 2018-11-19 NOTE — Telephone Encounter (Signed)
Patient requested refill [place] Verified LR: 10/21/2018 Pended Rx and sent to [facility] for approval. [name] out of office)

## 2018-11-22 ENCOUNTER — Ambulatory Visit: Payer: PPO | Admitting: Hematology

## 2018-11-22 ENCOUNTER — Other Ambulatory Visit: Payer: PPO

## 2018-11-28 ENCOUNTER — Telehealth: Payer: Self-pay | Admitting: Hematology

## 2018-11-28 NOTE — Telephone Encounter (Signed)
Received a phone call about the patient wanting to reschedule her missed appt.  Called the patient and rescheduled the appt patient request.  Patient aware of appt date and time.

## 2018-12-03 ENCOUNTER — Other Ambulatory Visit: Payer: Self-pay | Admitting: Nurse Practitioner

## 2018-12-03 ENCOUNTER — Telehealth: Payer: Self-pay | Admitting: *Deleted

## 2018-12-03 MED ORDER — FEBUXOSTAT 40 MG PO TABS: 40.0000 mg | ORAL_TABLET | Freq: Every day | ORAL | 2 refills | Status: DC

## 2018-12-03 NOTE — Telephone Encounter (Signed)
To stop allopurinol, uloric sent to pharmacy on file.

## 2018-12-03 NOTE — Telephone Encounter (Signed)
Patient notified and agreed.  

## 2018-12-03 NOTE — Telephone Encounter (Signed)
Patient called requesting to go back on the Uloric for gout pain. Stated that she cannot remember the name of the other gout pill but it doesn't work. Please Advise.

## 2018-12-10 ENCOUNTER — Other Ambulatory Visit: Payer: Self-pay | Admitting: *Deleted

## 2018-12-10 DIAGNOSIS — E1140 Type 2 diabetes mellitus with diabetic neuropathy, unspecified: Secondary | ICD-10-CM

## 2018-12-10 DIAGNOSIS — E1121 Type 2 diabetes mellitus with diabetic nephropathy: Secondary | ICD-10-CM

## 2018-12-10 DIAGNOSIS — IMO0002 Reserved for concepts with insufficient information to code with codable children: Secondary | ICD-10-CM

## 2018-12-10 MED ORDER — FREESTYLE LIBRE 14 DAY SENSOR MISC: 1.0000 [IU] | Freq: Three times a day (TID) | 12 refills | Status: DC

## 2018-12-10 NOTE — Telephone Encounter (Signed)
[employer]

## 2018-12-12 ENCOUNTER — Ambulatory Visit
Admission: RE | Admit: 2018-12-12 | Discharge: 2018-12-12 | Disposition: A | Discharge status: Home / Self Care | Payer: PPO | Source: Ambulatory Visit | Attending: Hematology | Admitting: Hematology

## 2018-12-12 ENCOUNTER — Other Ambulatory Visit: Payer: Self-pay

## 2018-12-12 DIAGNOSIS — R928 Other abnormal and inconclusive findings on diagnostic imaging of breast: Secondary | ICD-10-CM | POA: Diagnosis not present

## 2018-12-12 DIAGNOSIS — C50112 Malignant neoplasm of central portion of left female breast: Secondary | ICD-10-CM

## 2018-12-13 DIAGNOSIS — N183 Chronic kidney disease, stage 3 (moderate): Secondary | ICD-10-CM | POA: Diagnosis not present

## 2018-12-13 DIAGNOSIS — N189 Chronic kidney disease, unspecified: Secondary | ICD-10-CM | POA: Diagnosis not present

## 2018-12-16 ENCOUNTER — Telehealth: Payer: Self-pay | Admitting: *Deleted

## 2018-12-16 DIAGNOSIS — I2510 Atherosclerotic heart disease of native coronary artery without angina pectoris: Secondary | ICD-10-CM | POA: Diagnosis not present

## 2018-12-16 DIAGNOSIS — I129 Hypertensive chronic kidney disease with stage 1 through stage 4 chronic kidney disease, or unspecified chronic kidney disease: Secondary | ICD-10-CM | POA: Diagnosis not present

## 2018-12-16 DIAGNOSIS — G4733 Obstructive sleep apnea (adult) (pediatric): Secondary | ICD-10-CM | POA: Diagnosis not present

## 2018-12-16 DIAGNOSIS — D631 Anemia in chronic kidney disease: Secondary | ICD-10-CM | POA: Diagnosis not present

## 2018-12-16 DIAGNOSIS — E875 Hyperkalemia: Secondary | ICD-10-CM | POA: Diagnosis not present

## 2018-12-16 DIAGNOSIS — N2581 Secondary hyperparathyroidism of renal origin: Secondary | ICD-10-CM | POA: Diagnosis not present

## 2018-12-16 DIAGNOSIS — Z6841 Body Mass Index (BMI) 40.0 and over, adult: Secondary | ICD-10-CM | POA: Diagnosis not present

## 2018-12-16 DIAGNOSIS — E1122 Type 2 diabetes mellitus with diabetic chronic kidney disease: Secondary | ICD-10-CM | POA: Diagnosis not present

## 2018-12-16 DIAGNOSIS — I5030 Unspecified diastolic (congestive) heart failure: Secondary | ICD-10-CM | POA: Diagnosis not present

## 2018-12-16 DIAGNOSIS — C50912 Malignant neoplasm of unspecified site of left female breast: Secondary | ICD-10-CM | POA: Diagnosis not present

## 2018-12-16 DIAGNOSIS — N183 Chronic kidney disease, stage 3 (moderate): Secondary | ICD-10-CM | POA: Diagnosis not present

## 2018-12-16 NOTE — Telephone Encounter (Signed)
Daughter, [name] called and left message on clinical intake stating that they want a order for Home Health to come into home to assist patient.   Tried calling her back and number rang busy x 2. Will try again later to schedule an appointment for patient to be evaluated.

## 2018-12-16 NOTE — Progress Notes (Signed)
CARDIOLOGY OFFICE NOTE  Date:  12/18/2018    [name] Date of Birth: 29-Jan-1935 Medical Record #[id number]  PCP:  [name], NP  Cardiologist:  Hazle Quant  Chief Complaint  Patient presents with  . Follow-up    History of Present Illness: [name] is a 84 y.o. female who presents today for a one year check. Seen for Dr. Meda Coffee. She has been seen byDr. [name] in the past. I have seen her sporadically.   She has multiple issues which include known CAD with past inferior MI in 2004. She has had bare-metal stent to the RCA in 2004 and drug-eluting stent placement to the RCA in 06/2010 in the setting of unstable angina with overall preserved LV function. Last cath in 2012 showing otherwise nonobstructive disease. Her other problems include morbid obesity, severe OA - basically confined to a wheelchair, COPD, DM, HTN, HLD, incontinence, OSA, polymyalgia rheumatica, anxiety, dementia, depression and gallbladder disease. She is on chronic anticoagulation for DVT/PE since September of 2013. Echo last September 2013 was a TDS but with a normal EF noted  In February 2016, she developed increasing episodes of exertional shortness of breath. A nuclear perfusion study was interpreted as high risk of inferior and anteroapical ischemia. Her Coumadin was held and she was referred to undergo cardiac catheterization. This was done on 06/29/2014 and showed normal global LV function with mild mid inferior hypocontractility. There was evidence for coronary calcification with smooth 20% proximal narrowing followed by 30% stenosis before the first diagonal branch, 50-60% stenosis in the proximal diagonal 1 vessel, 30% mid LAD smooth narrowing, and 20% smooth proximal left circumflex stenosis. Her RCA stents were widely patent.  Last seen by me back last June of 2019 - she had been admitted the month prior with chest pain, SOB and swelling. History very hard to follow.  Treated for diastolic HF. No further cardiac testing felt to be warranted. Compliance felt to be an issue. Declined SNF placement. At my visit her medicines were very unclear. Very sad situation which I did not see improving.   Does not tend to follow up here routinely.   The patient does not have symptoms concerning for COVID-19 infection (fever, chills, cough, or new shortness of breath).   Had recent PCP visit back in June - lots of depression noted. Blood sugars uncontrolled. Occasional chest pain. Uses NTG off and on. She is to be managed medically. Wanting to get set up for counseling. Wanting a hospital bed.   Comes in today. Here alone today. Family dropped her off. Remains in a wheelchair. She does seem more awake to me. She tells me her heart is "great" an denies chest pain. She says some days are good and some are not. She has marked dyspnea with activity - she is very sedentary - can get hoarse with talking. She spends the day sitting. No edema. Tolerating her medicines.   Past Medical History:  Diagnosis Date  . Allergy    takes Mucinex daily as needed  . Anemia, unspecified   . Anxiety    takes Clonazepam daily as needed  . Arthritis   . Cancer (HCC)    breast  . CHF (congestive heart failure) ([place])    takes Furosemide daily  . CKD (chronic kidney disease)   . Coronary artery disease    a. s/p IMI 2004 tx with BMS to RCA;  b. s/p Promus DES to RCA 2/12 c. 06/2014 High  risk stress test follow up cath 06/2014 showd patent stent--< med therapy for other mild to moedrate disease   . Coronary atherosclerosis of native coronary artery   . Depression    takes Cymbalta daily  . Diabetes mellitus    insulin daily  . Dysuria   . GERD (gastroesophageal reflux disease)    takes Protonix daily  . Gout, unspecified    takes Colchicine daily  . Headache    occasionally  . History of blood clots about 33yrs ago   in legs-takes Coumadin   . Hyperlipidemia    takes Pravastatin daily   . Hypertension    takes Imdur and Metoprolol daily  . Insomnia   . Joint pain   . Joint swelling   . Myocardial infarction ([place]) 2004  . Numbness   . Obstructive sleep apnea    does not wear cpap  . Osteoarthritis   . Osteoarthritis   . Osteoarthrosis, unspecified whether generalized or localized, lower leg   . Pain, chronic   . Polymyalgia rheumatica ([place])   . Pulmonary emboli ([place]) 9/13   felt to need lifelong anticoagulation  . Shortness of breath dyspnea    with exertion and has Albuterol inhaler prn  . Type II or unspecified type diabetes mellitus without mention of complication, uncontrolled   . Urinary incontinence    takes Linzess daily  . Vertigo    hx of;was taking Meclizine if needed  . Wears dentures     Past Surgical History:  Procedure Laterality Date  . ABDOMINAL HYSTERECTOMY     partial  . APPENDECTOMY    . blood clots/legs and lungs  2013  . BREAST BIOPSY Left 07/22/2014  . BREAST BIOPSY Left 02/10/2013  . BREAST LUMPECTOMY Left 11/05/2014  . BREAST LUMPECTOMY WITH RADIOACTIVE SEED LOCALIZATION Left 11/05/2014   Procedure: LEFT BREAST LUMPECTOMY WITH RADIOACTIVE SEED LOCALIZATION;  Surgeon: [name], MD;  Location: [facility];  Service: General;  Laterality: Left;  . CARDIAC CATHETERIZATION    . COLONOSCOPY    . CORONARY ANGIOPLASTY  2  . ESOPHAGOGASTRODUODENOSCOPY (EGD) WITH PROPOFOL N/A 11/07/2016   Procedure: ESOPHAGOGASTRODUODENOSCOPY (EGD) WITH PROPOFOL;  Surgeon: Gatha Mayer, MD;  Location: [facility];  Service: Endoscopy;  Laterality: N/A;  . EXCISION OF SKIN TAG Right 11/05/2014   Procedure: EXCISION OF RIGHT EYELID SKIN TAG;  Surgeon: [name], MD;  Location: [facility];  Service: General;  Laterality: Right;  . EYE SURGERY Bilateral    cataract   . GASTRIC BYPASS  1977    reversed in 1979, [place] N/A 06/29/2014   Procedure: LEFT HEART CATHETERIZATION WITH CORONARY  ANGIOGRAM;  Surgeon: Troy Sine, MD;  Location: [facility] CATH LAB;  Service: Cardiovascular;  Laterality: N/A;  . MEMBRANE PEEL Right 10/23/2018   Procedure: MEMBRANE PEEL;  Surgeon: Hurman Horn, MD;  Location: [facility];  Service: Ophthalmology;  Laterality: Right;  . MI with stent placement  2004  . PARS PLANA VITRECTOMY Right 10/23/2018   Procedure: PARS PLANA VITRECTOMY WITH 25 GAUGE;  Surgeon: Hurman Horn, MD;  Location: [facility];  Service: Ophthalmology;  Laterality: Right;     Medications: Current Meds  Medication Sig  . albuterol (VENTOLIN HFA) 108 (90 Base) MCG/ACT inhaler Inhale 1-2 puffs into the lungs every 6 (six) hours as needed for wheezing or shortness of breath.  . anastrozole (ARIMIDEX) 1 MG tablet TAKE 1 TABLET BY MOUTH EVERY DAY  . budesonide-formoterol (  SYMBICORT) 160-4.5 MCG/ACT inhaler Inhale 2 puffs into the lungs 2 (two) times daily. For bronchitis  . clonazePAM (KLONOPIN) 1 MG tablet Take two tablets by mouth at bedtime as needed for anxiety  . Continuous Blood Gluc Receiver (FREESTYLE LIBRE 14 DAY READER) DEVI 1 Device by Does not apply route 4 (four) times daily -  before meals and at bedtime. For diabetes  . Continuous Blood Gluc Sensor (FREESTYLE LIBRE 14 DAY SENSOR) MISC 1 Units by Does not apply route 4 (four) times daily -  before meals and at bedtime. For diabetes  . DULoxetine (CYMBALTA) 60 MG capsule Take one capsule by mouth once daily for anxiety  . febuxostat (ULORIC) 40 MG tablet Take 1 tablet (40 mg total) by mouth daily.  . furosemide (LASIX) 20 MG tablet Take 1 tablet (20 mg total) by mouth daily as needed.  . hydrALAZINE (APRESOLINE) 50 MG tablet TAKE 1 TABLET BY MOUTH 2 TIMES DAILY  . insulin aspart (NOVOLOG FLEXPEN) 100 UNIT/ML FlexPen Inject 12 Units into the skin 3 (three) times daily with meals. Max 45 units  . Insulin Glargine, 2 Unit Dial, (TOUJEO MAX SOLOSTAR) 300 UNIT/ML SOPN Inject 40 Units into the skin daily. Patient assistance program  provides  . isosorbide mononitrate (IMDUR) 60 MG 24 hr tablet Take one tablet by mouth once daily  . magnesium oxide (MAG-OX) 400 MG tablet Take 400 mg by mouth daily.  . meclizine (ANTIVERT) 25 MG tablet Take one tablet by mouth three times daily as needed for diziness  . Melatonin 3 MG TABS Take 1 tablet (3 mg total) by mouth at bedtime.  . memantine (NAMENDA) 5 MG tablet TAKE 2 TABLETS BY MOUTH EVERY MORNING and TAKE 1 TABLET BY MOUTH EVERY EVENING FOR MEMORY  . metoprolol succinate (TOPROL-XL) 25 MG 24 hr tablet Take one tablet by mouth once daily for blood pressure  . nitroGLYCERIN (NITROSTAT) 0.4 MG SL tablet Take one tablet under the tongue every 5 minutes as needed for chest pain  . oxyCODONE-acetaminophen (PERCOCET) 10-325 MG tablet Take 1 tablet by mouth every 6 (six) hours.  . pantoprazole (PROTONIX) 40 MG tablet Take 40 mg by mouth daily.  . pravastatin (PRAVACHOL) 20 MG tablet Take one tablet by mouth once daily for cholesterol  . senna-docusate (SENOKOT-S) 8.6-50 MG tablet Take 1 tablet by mouth at bedtime.  . sertraline (ZOLOFT) 25 MG tablet Take 1 tablet (25 mg total) by mouth daily.  . sodium polystyrene (KAYEXALATE) 15 GM/60ML suspension 15 gm by mouth weekly to keep potassium down.  Alveda Reasons 10 MG TABS tablet TAKE 1 TABLET BY MOUTH EVERY DAY     Allergies: Allergies  Allergen Reactions  . Sulfonamide Derivatives Swelling    Mouth swelling  . Aricept [Donepezil Hcl] Diarrhea and Nausea And Vomiting    GI upset/loose stools  . Tramadol Nausea And Vomiting    Social History: The patient  reports that she has never smoked. She has never used smokeless tobacco. She reports that she does not drink alcohol or use drugs.   Family History: [name] family history includes Arthritis in her father and sister; Breast cancer (age of onset: 23) in her mother; Diabetes in her father and sister; Heart disease in her cousin and mother; Hypertension in her father; Obesity in  her sister; Throat cancer in her father.   Review of Systems: Please see the history of present illness.   All other systems are reviewed and negative.   Physical Exam: VS:  BP 118/76   Pulse 74   Ht 5\' 7"  (1.702 m)   Wt (!) 329 lb 12.8 oz (149.6 kg)   SpO2 96%   BMI 51.65 kg/m  .  BMI Body mass index is 51.65 kg/m.  Wt Readings from Last 3 Encounters:  12/18/18 (!) 329 lb 12.8 oz (149.6 kg)  12/18/18 (!) 329 lb 12.8 oz (149.6 kg)  10/23/18 (!) 325 lb (147.4 kg)    General: Morbidly obese. Elderly. More awake and in no acute distress. In a wheelchair.   HEENT: Normal.  Neck: Supple, no JVD, carotid bruits, or masses noted. She does get hoarse with talking. Improves with rest.   Cardiac: Regular rate and rhythm. Heart tones are distant. She has no edema of the lower extremities today.  Respiratory:  Lungs are relatively clear to auscultation.   GI: Obese.  MS: No deformity or atrophy. Gait not tested.   Skin: Warm and dry. Color is normal.  Neuro:  Strength and sensation are intact and no gross focal deficits noted.  Psych: Alert, appropriate and with normal affect.   LABORATORY DATA:  EKG:  EKG is not ordered today.  Lab Results  Component Value Date   WBC 13.9 (H) 10/23/2018   HGB 11.2 (L) 10/23/2018   HCT 36.6 10/23/2018   PLT 325 10/23/2018   GLUCOSE 212 (H) 10/23/2018   CHOL 90 07/17/2018   TRIG 74 07/17/2018   HDL 35 (L) 07/17/2018   LDLCALC 40 07/17/2018   ALT 14 08/27/2018   AST 16 08/27/2018   NA 138 10/23/2018   K 4.4 10/23/2018   CL 104 10/23/2018   CREATININE 1.55 (H) 10/23/2018   BUN 39 (H) 10/23/2018   CO2 23 10/23/2018   TSH 0.884 10/09/2017   INR 1.2 10/23/2018   HGBA1C 8.3 (H) 07/17/2018   MICROALBUR 2.9 06/14/2017     BNP (last 3 results) Recent Labs    08/27/18 1355  BNP 57.4    ProBNP (last 3 results) No results for input(s): PROBNP in the last 8760 hours.   Other Studies Reviewed Today:  Echo Study Conclusions 10/2017   - Procedure narrative: Transthoracic echocardiography. The study was technically difficult, as a result of body habitus. - Left ventricle: The cavity size was normal. Wall thickness was increased in a pattern of moderate LVH. There was severe concentric hypertrophy. Systolic function was vigorous. The estimated ejection fraction was in the range of 65% to 70%. Doppler parameters are consistent with abnormal left ventricular relaxation (grade 1 diastolic dysfunction). The E/e&' ratio is between 8-15, suggesting indeterminate LV filling pressure. - Mitral valve: Poorly visualized. Calcified annulus. There was no significant regurgitation. - Left atrium: The atrium was normal in size. - Inferior vena cava: The vessel was normal in size. The respirophasic diameter changes were in the normal range (>= 50%), consistent with normal central venous pressure.  Impressions:  - Technically difficult study. Compared to a prior study in 07/2017, the LVEF is higher at 65-70%.   Echo 07/27/2017 Study Conclusions  - Left ventricle: The cavity size was normal. Wall thickness was increased in a pattern of severe LVH. Systolic function was normal. The estimated ejection fraction was in the range of 60% to 65%. There is akinesis of the basalinferior myocardium. The study is not technically sufficient to allow evaluation of LV diastolic function. - Mitral valve: Severely calcified annulus.  Cath 06/29/2014 ANGIOGRAPHY:  Fluoroscopy revealed coronary calcification of left coronary system and RCA.  The left main coronary  artery was a moderate-sized long vessel which wasangiographically normal and bifurcated into the LAD and left circumflex coronary artery.   The LAD had proximal calcification. There was smooth proximal narrowing before the first septal perforating artery. There was 30% narrowing proximal to the takeoff of the first agonal vessel with 50-60%  narrowing in the proximal portion of this diagonal and 30% narrowing in the LAD beyond the diagonal vessel.  The left circumflex coronary artery was a moderate size vessel that had smooth 20% proximal narrowing prior to giving off one major bifurcating marginal branch.  The RCA was large caliber vessel. There was a patent stent in the proximal RCA and a patent stent in the mid RCA. The remainder of the RCA was free of significant disease. The vessel supplied a moderate size PDA and smaller PLA vessel.  Left ventriculography revealed normal global LV contractility with mild mid inferior hypocontractility. There was no evidence for mitral regurgitation. Ejection fraction is 55-60%.  IMPRESSION:  Normal global LV function with mild mid inferior hypocontractility.  Coronary calcification with smooth 20% proximal narrowing followed by 30% stenosis before the first diagonal branch, 50-60% stenosis in the proximal diagonal 1 vessel with 30% mid LAD smooth narrowing; 20% smooth stenosis in the proximal left circumflex: Artery, and widely patent proximal and mid RCA stents without significant RCA stenoses.  RECOMMENDATION:  Medical therapy.    Assessment/Plan:   1. Diastolic HF - chronic - no edema noted. BP is ok. She has had recent labs. I do not think any changes are warranted. She has chronic DOE - most likely related to sedentary lifestyle and morbid obesity. She does get hoarse with talking - I don't think this is cardiac related - may need to see ENT.   2. CAD with remote PCI - managed conservatively - she tells me she is not having chest pain. Would favor continued medical management.   3. Morbid obesity - I do not see this changing.   4. HTN - BP is ok today - no changes made.   5. Uncontrolled DM - per PCP  6. Non compliance  7. CKD - not able to have ACE/ARB   8. Prior PE/DVT - on Xarelto. Remains anemic. Lab from June noted.   9. Dementia  10. COVID-19  Education: The signs and symptoms of COVID-19 were discussed with the patient and how to seek care for testing (follow up with PCP or arrange E-visit).  The importance of social distancing, staying at home, hand hygiene and wearing a mask when out in public were discussed today.  Current medicines are reviewed with the patient today.  The patient does not have concerns regarding medicines other than what has been noted above.  The following changes have been made:  See above.  Labs/ tests ordered today include:   No orders of the defined types were placed in this encounter.    Disposition:   FU with [place] in about 6 months.    Patient is agreeable to this plan and will call if any problems develop in the interim.   SignedTruitt Merle, NP  12/18/2018 12:05 PM  [facility] [address] [place], [place]  [address] Phone: [phone] Fax: [phone]

## 2018-12-17 NOTE — Telephone Encounter (Signed)
Scheduled appointment for tomorrow with [place].

## 2018-12-18 ENCOUNTER — Ambulatory Visit: Payer: PPO | Admitting: Nurse Practitioner

## 2018-12-18 ENCOUNTER — Encounter: Payer: Self-pay | Admitting: Family

## 2018-12-18 ENCOUNTER — Other Ambulatory Visit: Payer: Self-pay

## 2018-12-18 ENCOUNTER — Ambulatory Visit (INDEPENDENT_AMBULATORY_CARE_PROVIDER_SITE_OTHER): Payer: PPO | Admitting: Family

## 2018-12-18 ENCOUNTER — Encounter: Payer: Self-pay | Admitting: Nurse Practitioner

## 2018-12-18 VITALS — BP 118/76 | HR 74 | Ht 67.0 in | Wt 329.8 lb

## 2018-12-18 VITALS — BP 118/70 | HR 80 | Temp 98.2°F | Ht 67.0 in | Wt 329.8 lb

## 2018-12-18 DIAGNOSIS — R251 Tremor, unspecified: Secondary | ICD-10-CM

## 2018-12-18 DIAGNOSIS — J410 Simple chronic bronchitis: Secondary | ICD-10-CM

## 2018-12-18 DIAGNOSIS — I5032 Chronic diastolic (congestive) heart failure: Secondary | ICD-10-CM

## 2018-12-18 DIAGNOSIS — Z794 Long term (current) use of insulin: Secondary | ICD-10-CM | POA: Diagnosis not present

## 2018-12-18 DIAGNOSIS — E1140 Type 2 diabetes mellitus with diabetic neuropathy, unspecified: Secondary | ICD-10-CM

## 2018-12-18 DIAGNOSIS — I10 Essential (primary) hypertension: Secondary | ICD-10-CM | POA: Diagnosis not present

## 2018-12-18 DIAGNOSIS — R2681 Unsteadiness on feet: Secondary | ICD-10-CM | POA: Diagnosis not present

## 2018-12-18 DIAGNOSIS — K5901 Slow transit constipation: Secondary | ICD-10-CM

## 2018-12-18 DIAGNOSIS — R531 Weakness: Secondary | ICD-10-CM | POA: Diagnosis not present

## 2018-12-18 DIAGNOSIS — F321 Major depressive disorder, single episode, moderate: Secondary | ICD-10-CM

## 2018-12-18 DIAGNOSIS — Z7189 Other specified counseling: Secondary | ICD-10-CM | POA: Diagnosis not present

## 2018-12-18 DIAGNOSIS — I259 Chronic ischemic heart disease, unspecified: Secondary | ICD-10-CM | POA: Diagnosis not present

## 2018-12-18 MED ORDER — SERTRALINE HCL 25 MG PO TABS: 25.0000 mg | ORAL_TABLET | Freq: Every day | ORAL | 0 refills | Status: DC

## 2018-12-18 MED ORDER — ALBUTEROL SULFATE HFA 108 (90 BASE) MCG/ACT IN AERS: 1.0000 | INHALATION_SPRAY | Freq: Four times a day (QID) | RESPIRATORY_TRACT | 3 refills | Status: DC | PRN

## 2018-12-18 MED ORDER — SENNOSIDES-DOCUSATE SODIUM 8.6-50 MG PO TABS: 1.0000 | ORAL_TABLET | Freq: Every day | ORAL | Status: DC

## 2018-12-18 MED ORDER — MELATONIN 3 MG PO TABS: 3.0000 mg | ORAL_TABLET | Freq: Every day | ORAL | 0 refills | Status: AC

## 2018-12-18 NOTE — Patient Instructions (Addendum)
After Visit Summary:  We will be checking the following labs today - NONE   Medication Instructions:    Continue with your current medicines.    If you need a refill on your cardiac medications before your next appointment, please call your pharmacy.     Testing/Procedures To Be Arranged:  N/A  Follow-Up:   See me in 6 months    At [facility], you and your health needs are our priority.  As part of our continuing mission to provide you with exceptional heart care, we have created designated Provider Care Teams.  These Care Teams include your primary Cardiologist (physician) and Advanced Practice Providers (APPs -  Physician Assistants and Nurse Practitioners) who all work together to provide you with the care you need, when you need it.  Special Instructions:  . Stay safe, stay home, wash your hands for at least 20 seconds and wear a mask when out in public.  . It was good to talk with you today.    Call the [facility] office at [phone] if you have any questions, problems or concerns.       

## 2018-12-18 NOTE — Progress Notes (Signed)
Provider: Aimie Wagman FNP-C  [name], NP  Patient Care Team: [name], NP as PCP - General (Geriatric Medicine) [name], [name], MD (Inactive) as Consulting Physician (Cardiology) [name], MD as Consulting Physician (Ophthalmology) [name], MD as Consulting Physician (General Surgery) [name], MD (Inactive) as Referring Physician (Obstetrics and Gynecology) [name] [name], MD as Consulting Physician (Ophthalmology)  Extended Emergency Contact Information Primary Emergency Contact: Gore,Pamela Address: [address]          [place], [place] [address] Johnnette Litter of [place] Phone: [phone] Mobile Phone: [phone] Relation: Daughter Secondary Emergency Contact: Lanny Cramp, [place] [address] Johnnette Litter of [place] Phone: [phone] Mobile Phone: [phone] Relation: Daughter  Code Status:  Full Code  Goals of care: Advanced Directive information Advanced Directives 10/23/2018  Does Patient Have a Medical Advance Directive? No  Type of Advance Directive -  Does patient want to make changes to medical advance directive? -  Copy of [place] in Chart? -  Would patient like information on creating a medical advance directive? No - Patient declined  Pre-existing out of facility DNR order (yellow form or pink MOST form) -     Chief Complaint  Patient presents with  . Acute Visit    Patient states can not bathe herself and when she tried to feed herself hands shake so bad , patient is also unable to get off bed and go to bathroom without help. Needs a home health referral.     HPI:  Pt is a 84 y.o. female seen today for an acute visit for evaluation of unsteady gait.she is here with her daughter.Daughter sates patient having difficulties performing her own activity of living.Her daughter who usually assist has had a knee replacement now on crutches unable to assist patient with her  care.Patient states can not bathe herself and when she tries to feed herself her hands shake so bad.Also has numbness and tingling of hands and feet unable to perform her ADL's.  she states unable to get off bed and go to bathroom without help.she request home health referral to assist with her activity of living.she is on a wheelchair during visit unable to transfer to examination table. Also reports feelings of depression all the time.currently on Cymbalta 60 mg capsule but not effective.also takes Klonopin for anxiety.she does not sleep well at night states sometimes stays in bed whole day sleeping during the day then cannot sleep at night.Also sleeps with her TV on at night.    Past Medical History:  Diagnosis Date  . Allergy    takes Mucinex daily as needed  . Anemia, unspecified   . Anxiety    takes Clonazepam daily as needed  . Arthritis   . Cancer (HCC)    breast  . CHF (congestive heart failure) ([place])    takes Furosemide daily  . CKD (chronic kidney disease)   . Coronary artery disease    a. s/p IMI 2004 tx with BMS to RCA;  b. s/p Promus DES to RCA 2/12 c. 06/2014 High risk stress test follow up cath 06/2014 showd patent stent--< med therapy for other mild to moedrate disease   . Coronary atherosclerosis of native coronary artery   . Depression    takes Cymbalta daily  . Diabetes mellitus    insulin daily  . Dysuria   . GERD (gastroesophageal reflux disease)    takes Protonix daily  .  Gout, unspecified    takes Colchicine daily  . Headache    occasionally  . History of blood clots about 67yrs ago   in legs-takes Coumadin   . Hyperlipidemia    takes Pravastatin daily  . Hypertension    takes Imdur and Metoprolol daily  . Insomnia   . Joint pain   . Joint swelling   . Myocardial infarction ([place]) 2004  . Numbness   . Obstructive sleep apnea    does not wear cpap  . Osteoarthritis   . Osteoarthritis   . Osteoarthrosis, unspecified whether generalized or localized,  lower leg   . Pain, chronic   . Polymyalgia rheumatica ([place])   . Pulmonary emboli ([place]) 9/13   felt to need lifelong anticoagulation  . Shortness of breath dyspnea    with exertion and has Albuterol inhaler prn  . Type II or unspecified type diabetes mellitus without mention of complication, uncontrolled   . Urinary incontinence    takes Linzess daily  . Vertigo    hx of;was taking Meclizine if needed  . Wears dentures    Past Surgical History:  Procedure Laterality Date  . ABDOMINAL HYSTERECTOMY     partial  . APPENDECTOMY    . blood clots/legs and lungs  2013  . BREAST BIOPSY Left 07/22/2014  . BREAST BIOPSY Left 02/10/2013  . BREAST LUMPECTOMY Left 11/05/2014  . BREAST LUMPECTOMY WITH RADIOACTIVE SEED LOCALIZATION Left 11/05/2014   Procedure: LEFT BREAST LUMPECTOMY WITH RADIOACTIVE SEED LOCALIZATION;  Surgeon: [name], MD;  Location: [facility];  Service: General;  Laterality: Left;  . CARDIAC CATHETERIZATION    . COLONOSCOPY    . CORONARY ANGIOPLASTY  2  . ESOPHAGOGASTRODUODENOSCOPY (EGD) WITH PROPOFOL N/A 11/07/2016   Procedure: ESOPHAGOGASTRODUODENOSCOPY (EGD) WITH PROPOFOL;  Surgeon: Gatha Mayer, MD;  Location: [facility];  Service: Endoscopy;  Laterality: N/A;  . EXCISION OF SKIN TAG Right 11/05/2014   Procedure: EXCISION OF RIGHT EYELID SKIN TAG;  Surgeon: [name], MD;  Location: [facility];  Service: General;  Laterality: Right;  . EYE SURGERY Bilateral    cataract   . GASTRIC BYPASS  1977    reversed in 1979, [place] N/A 06/29/2014   Procedure: LEFT HEART CATHETERIZATION WITH CORONARY ANGIOGRAM;  Surgeon: Troy Sine, MD;  Location: [facility] C[facility];  Service: Cardiovascular;  Laterality: N/A;  . MEMBRANE PEEL Right 10/23/2018   Procedure: MEMBRANE PEEL;  Surgeon: Hurman Horn, MD;  Location: [facility];  Service: Ophthalmology;  Laterality: Right;  . MI with stent placement  2004  . PARS PLANA  VITRECTOMY Right 10/23/2018   Procedure: PARS PLANA VITRECTOMY WITH 25 GAUGE;  Surgeon: Hurman Horn, MD;  Location: [facility];  Service: Ophthalmology;  Laterality: Right;    Allergies  Allergen Reactions  . Sulfonamide Derivatives Swelling    Mouth swelling  . Aricept [Donepezil Hcl] Diarrhea and Nausea And Vomiting    GI upset/loose stools  . Tramadol Nausea And Vomiting    Outpatient Encounter Medications as of 12/18/2018  Medication Sig  . albuterol (PROVENTIL HFA;VENTOLIN HFA) 108 (90 Base) MCG/ACT inhaler Inhale 1-2 puffs into the lungs every 6 (six) hours as needed for wheezing or shortness of breath.  . anastrozole (ARIMIDEX) 1 MG tablet TAKE 1 TABLET BY MOUTH EVERY DAY  . budesonide-formoterol (SYMBICORT) 160-4.5 MCG/ACT inhaler Inhale 2 puffs into the lungs 2 (two) times daily. For bronchitis  . clonazePAM (KLONOPIN) 1 MG tablet  Take two tablets by mouth at bedtime as needed for anxiety  . Continuous Blood Gluc Receiver (FREESTYLE LIBRE 14 DAY READER) DEVI 1 Device by Does not apply route 4 (four) times daily -  before meals and at bedtime. For diabetes  . Continuous Blood Gluc Sensor (FREESTYLE LIBRE 14 DAY SENSOR) MISC 1 Units by Does not apply route 4 (four) times daily -  before meals and at bedtime. For diabetes  . DULoxetine (CYMBALTA) 60 MG capsule Take one capsule by mouth once daily for anxiety  . febuxostat (ULORIC) 40 MG tablet Take 1 tablet (40 mg total) by mouth daily.  . furosemide (LASIX) 20 MG tablet Take 1 tablet (20 mg total) by mouth daily as needed.  . hydrALAZINE (APRESOLINE) 50 MG tablet TAKE 1 TABLET BY MOUTH 2 TIMES DAILY  . insulin aspart (NOVOLOG FLEXPEN) 100 UNIT/ML FlexPen Inject 12 Units into the skin 3 (three) times daily with meals. Max 45 units  . Insulin Glargine, 2 Unit Dial, (TOUJEO MAX SOLOSTAR) 300 UNIT/ML SOPN Inject 40 Units into the skin daily. Patient assistance program provides  . isosorbide mononitrate (IMDUR) 60 MG 24 hr tablet Take one  tablet by mouth once daily  . magnesium oxide (MAG-OX) 400 MG tablet Take 400 mg by mouth daily.  . meclizine (ANTIVERT) 25 MG tablet Take one tablet by mouth three times daily as needed for diziness  . memantine (NAMENDA) 5 MG tablet TAKE 2 TABLETS BY MOUTH EVERY MORNING and TAKE 1 TABLET BY MOUTH EVERY EVENING FOR MEMORY  . metoprolol succinate (TOPROL-XL) 25 MG 24 hr tablet Take one tablet by mouth once daily for blood pressure  . nitroGLYCERIN (NITROSTAT) 0.4 MG SL tablet Take one tablet under the tongue every 5 minutes as needed for chest pain  . oxyCODONE-acetaminophen (PERCOCET) 10-325 MG tablet Take 1 tablet by mouth every 6 (six) hours.  . pantoprazole (PROTONIX) 40 MG tablet Take 40 mg by mouth daily.  . pravastatin (PRAVACHOL) 20 MG tablet Take one tablet by mouth once daily for cholesterol  . sodium polystyrene (KAYEXALATE) 15 GM/60ML suspension 15 gm by mouth weekly to keep potassium down.  Alveda Reasons 10 MG TABS tablet TAKE 1 TABLET BY MOUTH EVERY DAY  . [DISCONTINUED] Magnesium Oxide 400 (240 Mg) MG TABS TAKE 1 TABLET BY MOUTH EVERY DAY   No facility-administered encounter medications on file as of 12/18/2018.     Review of Systems  Constitutional: Negative for appetite change, chills, fatigue and fever.  HENT: Negative for congestion, rhinorrhea, sinus pressure, sinus pain, sneezing and sore throat.   Eyes: Positive for visual disturbance. Negative for discharge, redness and itching.       Status post surgery right eye in 10/2018   Respiratory: Negative for chest tightness, shortness of breath and wheezing.   Cardiovascular: Negative for chest pain, palpitations and leg swelling.  Gastrointestinal: Positive for constipation. Negative for abdominal distention, abdominal pain, diarrhea and nausea.       LBM 12/18/2018  Endocrine: Negative for cold intolerance, heat intolerance, polydipsia, polyphagia and polyuria.  Genitourinary: Negative for difficulty urinating, dysuria, flank  pain and urgency.  Musculoskeletal: Positive for gait problem.  Skin: Negative for color change, pallor and rash.  Neurological: Positive for numbness. Negative for dizziness, weakness, light-headedness and headaches.       Reports tremors on hands unable to feed self.Numbness and tingling of hands and feet unable to perform her ADL's.  Hematological: Does not bruise/bleed easily.  Psychiatric/Behavioral: Positive for sleep disturbance. Negative  for agitation and decreased concentration. The patient is nervous/anxious.        Feels depressed all time     Immunization History  Administered Date(s) Administered  . Influenza Whole 03/14/2007, 02/13/2008, 01/18/2010  . Influenza, High Dose Seasonal PF 01/16/2017, 03/11/2018  . Influenza,inj,Quad PF,6+ Mos 01/08/2013, 02/18/2014, 01/18/2015, 01/24/2016  . Influenza-Unspecified 02/15/2012, 02/18/2014  . Pneumococcal Conjugate-13 02/27/2017  . Pneumococcal Polysaccharide-23 05/08/2002  . Td 05/08/2008   Pertinent  Health Maintenance Due  Topic Date Due  . OPHTHALMOLOGY EXAM  04/25/2017  . INFLUENZA VACCINE  12/07/2018  . HEMOGLOBIN A1C  01/17/2019  . FOOT EXAM  06/04/2019  . MAMMOGRAM  12/12/2019  . DEXA SCAN  Completed  . PNA vac Low Risk Adult  Completed   Fall Risk  12/18/2018 10/11/2018 08/29/2018 07/17/2018 06/03/2018  Falls in the past year? 0 0 0 0 0  Number falls in past yr: 0 0 0 0 0  Comment - - - - -  Injury with Fall? 0 0 0 0 0  Comment - - - - -  Risk Factor Category  - - - - -  Risk for fall due to : - - - - -  Follow up - - - - -    Vitals:   12/18/18 1019  BP: 118/70  Pulse: 80  Temp: 98.2 F (36.8 C)  TempSrc: Oral  SpO2: 96%  Weight: (!) 329 lb 12.8 oz (149.6 kg)  Height: 5\' 7"  (1.702 m)   Body mass index is 51.65 kg/m. Physical Exam Constitutional:      General: She is not in acute distress.    Appearance: She is morbidly obese. She is not ill-appearing.  HENT:     Head: Normocephalic.     Nose: No  congestion or rhinorrhea.     Mouth/Throat:     Mouth: Mucous membranes are moist.     Pharynx: Oropharynx is clear. No oropharyngeal exudate or posterior oropharyngeal erythema.  Eyes:     General: No scleral icterus.       Right eye: No discharge.        Left eye: No discharge.     Conjunctiva/sclera: Conjunctivae normal.     Comments: Eye glasses in place   Neck:     Musculoskeletal: Normal range of motion. No neck rigidity or muscular tenderness.  Cardiovascular:     Rate and Rhythm: Normal rate and regular rhythm.     Pulses: Normal pulses.     Heart sounds: Normal heart sounds. No murmur. No friction rub. No gallop.   Pulmonary:     Effort: Pulmonary effort is normal. No respiratory distress.     Breath sounds: Normal breath sounds. No wheezing, rhonchi or rales.  Chest:     Chest wall: No tenderness.  Abdominal:     General: Bowel sounds are normal. There is no distension.     Palpations: Abdomen is soft. There is no mass.     Tenderness: There is no abdominal tenderness. There is no right CVA tenderness, left CVA tenderness, guarding or rebound.  Musculoskeletal:        General: No swelling or tenderness.     Right lower leg: No edema.     Left lower leg: No edema.     Comments: On wheelchair during visit.unsteady gait requires assistance with transfers.  Lymphadenopathy:     Cervical: No cervical adenopathy.  Skin:    General: Skin is warm and dry.     Coloration: Skin  is not pale.     Findings: No bruising, erythema or rash.  Neurological:     Mental Status: She is alert and oriented to person, place, and time.     Cranial Nerves: No cranial nerve deficit.     Motor: No weakness.     Coordination: Coordination normal.     Gait: Gait abnormal.  Psychiatric:        Mood and Affect: Mood is depressed. Mood is not anxious. Affect is not tearful or inappropriate.        Speech: Speech normal.        Behavior: Behavior normal.        Thought Content: Thought content  normal.        Judgment: Judgment normal.    Labs reviewed: Recent Labs    08/06/18 1122 08/27/18 1355 10/23/18 1052  NA 137 136 138  K 5.3 5.1 4.4  CL 106 105 104  CO2 26 20* 23  GLUCOSE 159* 267* 212*  BUN 48* 44* 39*  CREATININE 1.68* 1.65* 1.55*  CALCIUM 8.7 9.1 9.2   Recent Labs    03/11/18 1557 05/28/18 1324 08/27/18 1355  AST 11 10* 16  ALT 8 7 14   ALKPHOS  --  80 70  BILITOT 0.2 0.4 0.8  PROT 7.3 7.9 7.9  ALBUMIN  --  3.0* 2.9*   Recent Labs    03/11/18 1557 05/28/18 1324 08/27/18 1355 10/23/18 1052  WBC 11.7* 10.2 13.3* 13.9*  NEUTROABS 8,213* 7.1 10.0*  --   HGB 10.1* 9.4* 10.3* 11.2*  HCT 30.9* 31.1* 33.4* 36.6  MCV 88.3 95.7 95.4 95.8  PLT 290 270 281 325   Lab Results  Component Value Date   TSH 0.884 10/09/2017   Lab Results  Component Value Date   HGBA1C 8.3 (H) 07/17/2018   Lab Results  Component Value Date   CHOL 90 07/17/2018   HDL 35 (L) 07/17/2018   LDLCALC 40 07/17/2018   TRIG 74 07/17/2018   CHOLHDL 2.6 07/17/2018    Significant Diagnostic Results in last 30 days:  Mm Diag Breast Tomo Bilateral  Result Date: 12/12/2018 CLINICAL DATA:  Patient has a history of LEFT breast cancer, status post lumpectomy. Margins were positive at the time of surgery. Due to the patient's comorbidities, it was decided not performed re-excision. The patient takes tamoxifen.The patient is in a wheelchair and is accompanied by her grandson. The patient has known suspicious microcalcifications posterior to lumpectomy site. EXAM: DIGITAL DIAGNOSTIC BILATERAL MAMMOGRAM WITH CAD AND TOMO COMPARISON:  Previous exam(s). ACR Breast Density Category b: There are scattered areas of fibroglandular density. FINDINGS: Postoperative changes are identified in the anterior portion of the LEFT breast. Posterior to the lumpectomy site, there are numerous fine linear calcifications, stable in appearance compared to previous studies and consistent with DCIS. RIGHT breast is  negative. Study quality is degraded by limited mobility and patient body habitus. Mammographic images were processed with CAD. IMPRESSION: Stable appearance of both breasts. Stable appearance of suspicious calcifications in the anterior aspect of the LEFT breast. RECOMMENDATION: Diagnostic mammogram is suggested in 1 year. (Code:[id number]) I have discussed the findings and recommendations with the patient. Results were also provided in writing at the conclusion of the visit. If applicable, a reminder letter will be sent to the patient regarding the next appointment. BI-RADS CATEGORY  6: Known biopsy-proven malignancy. Electronically Signed   By: Nolon Nations M.D.   On: 12/12/2018 14:35   Assessment/Plan 1.  chronic bronchitis (  [place]) Requires refill for Albuterol. - albuterol (VENTOLIN HFA) 108 (90 Base) MCG/ACT inhaler; Inhale 1-2 puffs into the lungs every 6 (six) hours as needed for wheezing or shortness of breath.  2. Current moderate episode of major depressive disorder, unspecified whether recurrent ([place]) Reports feeling depressed all the time.continue on Cymbalta 60 mg capsule.Add sertraline 25 mg tablet one by mouth daily.Re-evaluate in 4 weeks.   3. Unsteady gait Unable to transfer without assistance.Daughter who's her caregiver has knee issues now on crutches unable to assist with patient's care. - Urgent Ambulatory referral to Home Health to assist with Activity of daily living.   4. Type 2 diabetes mellitus with diabetic neuropathy, with long-term current use of insulin (HCC) Reports CBG's in the 200's.continue current medication. - Ambulatory referral to [facility] for diabetic education and medication management.  5. Slow transit constipation Reports hard small stool.on Percocet for pain.add Senokot -S one by mouth daily at bedtime.   6. Generalized weakness Requires assistance with transfer from bed to chair. - Ambulatory referral to [facility] for  ROM,exercise,gait stability,safe transfer and muscle strengthening.   7. Occasional tremors Tremors limiting her activity of living including feeding self. - Ambulatory referral to Home Health aid to assist with her ADL's.   8.Insomnia  - encouraged sleep hygiene including turning off TV at night to prevent blue light effects. Take Melatonin 3-6  mg tablet by mouth at bedtime for sleep.   Family/ staff Communication: Reviewed plan of care with patient and Daughter.  Labs/tests ordered: None   Next appt: 4 weeks for depression evaluation   Time spent with patient 25 minutes >50% time spent counseling; reviewing medical record; tests; labs; and developing future plan of care  Sandrea Hughs, NP

## 2018-12-19 ENCOUNTER — Other Ambulatory Visit: Payer: Self-pay | Admitting: *Deleted

## 2018-12-19 ENCOUNTER — Telehealth: Payer: Self-pay | Admitting: *Deleted

## 2018-12-19 DIAGNOSIS — G894 Chronic pain syndrome: Secondary | ICD-10-CM

## 2018-12-19 MED ORDER — ALBUTEROL SULFATE HFA 108 (90 BASE) MCG/ACT IN AERS: 1.0000 | INHALATION_SPRAY | Freq: Four times a day (QID) | RESPIRATORY_TRACT | 1 refills | Status: AC | PRN

## 2018-12-19 MED ORDER — OXYCODONE-ACETAMINOPHEN 10-325 MG PO TABS: 1.0000 | ORAL_TABLET | Freq: Four times a day (QID) | ORAL | 0 refills | Status: DC

## 2018-12-19 NOTE — Telephone Encounter (Signed)
Patient requested refill Epic LR: 11/19/2018 Pended Rx and sent to [facility] for approval.

## 2018-12-19 NOTE — Telephone Encounter (Signed)
New Rx sent.

## 2018-12-19 NOTE — Telephone Encounter (Signed)
Received fax from [facility] requesting a new Rx for Proair. Stated that insurance will not cover Ventolin. Please Advise.

## 2018-12-20 ENCOUNTER — Encounter: Payer: Self-pay | Admitting: Internal Medicine

## 2018-12-20 ENCOUNTER — Ambulatory Visit (INDEPENDENT_AMBULATORY_CARE_PROVIDER_SITE_OTHER): Payer: PPO | Admitting: Internal Medicine

## 2018-12-20 ENCOUNTER — Other Ambulatory Visit: Payer: Self-pay

## 2018-12-20 VITALS — BP 128/78 | HR 87 | Temp 98.0°F | Ht 67.0 in | Wt 323.0 lb

## 2018-12-20 DIAGNOSIS — E1121 Type 2 diabetes mellitus with diabetic nephropathy: Secondary | ICD-10-CM | POA: Diagnosis not present

## 2018-12-20 DIAGNOSIS — IMO0002 Reserved for concepts with insufficient information to code with codable children: Secondary | ICD-10-CM

## 2018-12-20 DIAGNOSIS — N183 Chronic kidney disease, stage 3 unspecified: Secondary | ICD-10-CM

## 2018-12-20 DIAGNOSIS — Z794 Long term (current) use of insulin: Secondary | ICD-10-CM | POA: Diagnosis not present

## 2018-12-20 DIAGNOSIS — E1122 Type 2 diabetes mellitus with diabetic chronic kidney disease: Secondary | ICD-10-CM

## 2018-12-20 DIAGNOSIS — E1165 Type 2 diabetes mellitus with hyperglycemia: Secondary | ICD-10-CM

## 2018-12-20 LAB — POCT GLYCOSYLATED HEMOGLOBIN (HGB A1C): Hemoglobin A1C: 11.7 % — AB (ref 4.0–5.6)

## 2018-12-20 NOTE — Patient Instructions (Addendum)
-   Toujeo 40 units  - Increase Novolog at 14 units with Breakfast, Lunch and Dinner  - [employer] insulin: ADD extra units on insulin to your meal-time 14 units  dose if your blood sugars are higher than 180. Use the scale below to help guide you:   Blood sugar before meal Number of units to inject  Less than 180 0 unit  181 -  210 1 units  211 -  240 2 units  241 -  270 3 units  271 -  300 4 units  301 -  330 5 units  331 -  360 6 units  361 -  390 7 units    Choose healthy, lower carb lower calorie snacks: toss salad, cooked vegetables, cottage cheese, peanut butter, low fat cheese / string cheese, lower sodium deli meat, tuna salad or chicken salad    HOW TO TREAT LOW BLOOD SUGARS (Blood sugar LESS THAN 70 MG/DL)  Please follow the RULE OF 15 for the treatment of hypoglycemia treatment (when your (blood sugars are less than 70 mg/dL)    STEP 1: Take 15 grams of carbohydrates when your blood sugar is low, which includes:   3-4 GLUCOSE TABS  OR  3-4 OZ OF JUICE OR REGULAR SODA OR  ONE TUBE OF GLUCOSE GEL     STEP 2: RECHECK blood sugar in 15 MINUTES STEP 3: If your blood sugar is still low at the 15 minute recheck --> then, go back to STEP 1 and treat AGAIN with another 15 grams of carbohydrates.

## 2018-12-20 NOTE — Progress Notes (Signed)
Name: [name]  Age/ Sex: 84 y.o., female   MRN/ DOB: [id number], 1934-07-07     PCP: [name], NP   Reason for Endocrinology Evaluation: Type 2 Diabetes Mellitus     Initial Endocrinology Clinic Visit: 08/19/2018    PATIENT IDENTIFIER: [name] is a 84 y.o. female with a past medical history of T2DM, CKD III, and chronic pain syndrome . The patient has followed with Endocrinology clinic since 08/19/2018 for consultative assistance with management of her diabetes.  DIABETIC HISTORY:  [name] was diagnosed with T2DM > 40 yrs ago. Pt is a poor historian and unable to recall oral glycemic agents. She has been on insulin therapy for years. Her hemoglobin A1c has ranged from 6.7%  in 07/2017, peaking at 12.1% in 2018.  SUBJECTIVE:   During the last visit (09/20/2018): Her A1c was 8.3%. We decreased Toujeo to once a day dosin and Increased Novolog to 10 units daily.   Today (12/20/2018): [name] is here for a 3 month follow up visit on diabetes management. She is accompanied by her sister Lavouris, she checks her blood sugars through freestyle libre multiple  times daily. Her sister has been the new caretaker since 10/2018. The patient has not had hypoglycemic episodes since the last clinic visit.    ROS: As per HPI and as detailed below: Review of Systems  Constitutional: Negative for fever.  HENT: Negative for congestion and sore throat.   Respiratory: Negative for cough and shortness of breath.   Cardiovascular: Negative for chest pain and leg swelling.  Genitourinary: Negative for frequency.  Endo/Heme/Allergies: Negative for polydipsia.      HOME DIABETES REGIMEN:   Toujeo 40 units daily   Novolog 12 units TID   CF: BG -125/25 with meals - not using      GLUCOSE LOG:   Date  Fasting   8/14 174  8/13 194  8/12 155  8/10 219      HISTORY:  Past Medical History:  Past Medical History:  Diagnosis Date  . Allergy    takes Mucinex  daily as needed  . Anemia, unspecified   . Anxiety    takes Clonazepam daily as needed  . Arthritis   . Cancer (HCC)    breast  . CHF (congestive heart failure) ([place])    takes Furosemide daily  . CKD (chronic kidney disease)   . Coronary artery disease    a. s/p IMI 2004 tx with BMS to RCA;  b. s/p Promus DES to RCA 2/12 c. 06/2014 High risk stress test follow up cath 06/2014 showd patent stent--< med therapy for other mild to moedrate disease   . Coronary atherosclerosis of native coronary artery   . Depression    takes Cymbalta daily  . Diabetes mellitus    insulin daily  . Dysuria   . GERD (gastroesophageal reflux disease)    takes Protonix daily  . Gout, unspecified    takes Colchicine daily  . Headache    occasionally  . History of blood clots about 37yrs ago   in legs-takes Coumadin   . Hyperlipidemia    takes Pravastatin daily  . Hypertension    takes Imdur and Metoprolol daily  . Insomnia   . Joint pain   . Joint swelling   . Myocardial infarction ([place]) 2004  . Numbness   . Obstructive sleep apnea    does not wear cpap  . Osteoarthritis   .  Osteoarthritis   . Osteoarthrosis, unspecified whether generalized or localized, lower leg   . Pain, chronic   . Polymyalgia rheumatica ([place])   . Pulmonary emboli ([place]) 9/13   felt to need lifelong anticoagulation  . Shortness of breath dyspnea    with exertion and has Albuterol inhaler prn  . Type II or unspecified type diabetes mellitus without mention of complication, uncontrolled   . Urinary incontinence    takes Linzess daily  . Vertigo    hx of;was taking Meclizine if needed  . Wears dentures    Past Surgical History:  Past Surgical History:  Procedure Laterality Date  . ABDOMINAL HYSTERECTOMY     partial  . APPENDECTOMY    . blood clots/legs and lungs  2013  . BREAST BIOPSY Left 07/22/2014  . BREAST BIOPSY Left 02/10/2013  . BREAST LUMPECTOMY Left 11/05/2014  . BREAST LUMPECTOMY WITH RADIOACTIVE SEED  LOCALIZATION Left 11/05/2014   Procedure: LEFT BREAST LUMPECTOMY WITH RADIOACTIVE SEED LOCALIZATION;  Surgeon: [name], MD;  Location: [facility];  Service: General;  Laterality: Left;  . CARDIAC CATHETERIZATION    . COLONOSCOPY    . CORONARY ANGIOPLASTY  2  . ESOPHAGOGASTRODUODENOSCOPY (EGD) WITH PROPOFOL N/A 11/07/2016   Procedure: ESOPHAGOGASTRODUODENOSCOPY (EGD) WITH PROPOFOL;  Surgeon: Gatha Mayer, MD;  Location: [facility];  Service: Endoscopy;  Laterality: N/A;  . EXCISION OF SKIN TAG Right 11/05/2014   Procedure: EXCISION OF RIGHT EYELID SKIN TAG;  Surgeon: [name], MD;  Location: [facility];  Service: General;  Laterality: Right;  . EYE SURGERY Bilateral    cataract   . GASTRIC BYPASS  1977    reversed in 1979, [place] N/A 06/29/2014   Procedure: LEFT HEART CATHETERIZATION WITH CORONARY ANGIOGRAM;  Surgeon: Troy Sine, MD;  Location: [facility] CATH [facility];  Service: Cardiovascular;  Laterality: N/A;  . MEMBRANE PEEL Right 10/23/2018   Procedure: MEMBRANE PEEL;  Surgeon: Hurman Horn, MD;  Location: [place];  Service: Ophthalmology;  Laterality: Right;  . MI with stent placement  2004  . PARS PLANA VITRECTOMY Right 10/23/2018   Procedure: PARS PLANA VITRECTOMY WITH 25 GAUGE;  Surgeon: Hurman Horn, MD;  Location: [facility];  Service: Ophthalmology;  Laterality: Right;    Social History:  reports that she has never smoked. She has never used smokeless tobacco. She reports that she does not drink alcohol or use drugs. Family History:  Family History  Problem Relation Age of Onset  . Breast cancer Mother 9  . Heart disease Mother   . Throat cancer Father   . Hypertension Father   . Arthritis Father   . Diabetes Father   . Arthritis Sister   . Obesity Sister   . Diabetes Sister   . Heart disease Cousin   . Colon cancer Neg Hx   . Stomach cancer Neg Hx   . Esophageal cancer Neg Hx      HOME MEDICATIONS:  Allergies as of 12/20/2018      Reactions   Sulfonamide Derivatives Swelling   Mouth swelling   Aricept [donepezil Hcl] Diarrhea, Nausea And Vomiting   GI upset/loose stools   Tramadol Nausea And Vomiting      Medication List       Accurate as of December 20, 2018  3:57 PM. If you have any questions, ask your nurse or doctor.        albuterol 108 (90 Base) MCG/ACT inhaler Commonly known as:  ProAir HFA Inhale 1-2 puffs into the lungs every 6 (six) hours as needed for wheezing or shortness of breath.   allopurinol 300 MG tablet Commonly known as: ZYLOPRIM Take 1 tablet by mouth daily.   anastrozole 1 MG tablet Commonly known as: ARIMIDEX TAKE 1 TABLET BY MOUTH EVERY DAY   budesonide-formoterol 160-4.5 MCG/ACT inhaler Commonly known as: SYMBICORT Inhale 2 puffs into the lungs 2 (two) times daily. For bronchitis   clonazePAM 1 MG tablet Commonly known as: KLONOPIN Take two tablets by mouth at bedtime as needed for anxiety   DULoxetine 60 MG capsule Commonly known as: CYMBALTA Take one capsule by mouth once daily for anxiety   febuxostat 40 MG tablet Commonly known as: Uloric Take 1 tablet (40 mg total) by mouth daily.   FreeStyle Libre 14 Day Reader [name] 1 Device by Does not apply route 4 (four) times daily -  before meals and at bedtime. For diabetes   FreeStyle Libre 14 Day Sensor Misc 1 Units by Does not apply route 4 (four) times daily -  before meals and at bedtime. For diabetes   furosemide 20 MG tablet Commonly known as: LASIX Take 1 tablet (20 mg total) by mouth daily as needed.   hydrALAZINE 50 MG tablet Commonly known as: APRESOLINE TAKE 1 TABLET BY MOUTH 2 TIMES DAILY   Insulin Glargine (2 Unit Dial) 300 UNIT/ML Sopn Commonly known as: Toujeo Max SoloStar Inject 40 Units into the skin daily. Patient assistance program provides   isosorbide mononitrate 60 MG 24 hr tablet Commonly known as: IMDUR Take one tablet by mouth once daily   magnesium  oxide 400 MG tablet Commonly known as: MAG-OX Take 400 mg by mouth daily.   meclizine 25 MG tablet Commonly known as: ANTIVERT Take one tablet by mouth three times daily as needed for diziness   Melatonin 3 MG Tabs Take 1 tablet (3 mg total) by mouth at bedtime.   memantine 5 MG tablet Commonly known as: NAMENDA TAKE 2 TABLETS BY MOUTH EVERY MORNING and TAKE 1 TABLET BY MOUTH EVERY EVENING FOR MEMORY   metoprolol succinate 25 MG 24 hr tablet Commonly known as: TOPROL-XL Take one tablet by mouth once daily for blood pressure   nitroGLYCERIN 0.4 MG SL tablet Commonly known as: Nitrostat Take one tablet under the tongue every 5 minutes as needed for chest pain   NovoLOG FlexPen 100 UNIT/ML FlexPen Generic drug: insulin aspart Inject 12 Units into the skin 3 (three) times daily with meals. Max 45 units   oxyCODONE-acetaminophen 10-325 MG tablet Commonly known as: PERCOCET Take 1 tablet by mouth every 6 (six) hours.   pantoprazole 40 MG tablet Commonly known as: PROTONIX Take 40 mg by mouth daily.   pravastatin 20 MG tablet Commonly known as: PRAVACHOL Take one tablet by mouth once daily for cholesterol   senna-docusate 8.6-50 MG tablet Commonly known as: Senokot-S Take 1 tablet by mouth at bedtime.   sertraline 25 MG tablet Commonly known as: ZOLOFT Take 1 tablet (25 mg total) by mouth daily.   sodium polystyrene 15 GM/60ML suspension Commonly known as: KAYEXALATE 15 gm by mouth weekly to keep potassium down.   Xarelto 10 MG Tabs tablet Generic drug: rivaroxaban TAKE 1 TABLET BY MOUTH EVERY DAY       PHYSICAL EXAM: VS: BP 128/78 (BP Location: Right Arm, Patient Position: Sitting, Cuff Size: Large)   Pulse 87   Temp 98 F (36.7 C)   Ht 5\' 7"  (1.702 m)  Wt (!) 323 lb (146.5 kg)   SpO2 98%   BMI 50.59 kg/m    EXAM: General: Pt appears well and is in NAD in a wheelchair   Neck: General: Supple without adenopathy. Thyroid: Thyroid size normal.  No  goiter or nodules appreciated. No thyroid bruit.  Lungs: Clear with good BS bilat with no rales, rhonchi, or wheezes  Heart: Auscultation: RRR with normal S1 and S2 Periph. circulation: no peripheral edema  Abdomen: Normoactive bowel sounds, soft, nontender, without masses or organomegaly palpable  Lymphatics:   Extremities:  BL LE: no pretibial edema normal ROM and strength, no joint enlargement or tenderness  Neuro: Cranial nerves: II - XII grossly intact ;  Motor: normal strength throughout DTRs: 2+ and symmetric in UE without delay in relaxation phase  Mental Status: Judgment, insight: intact Memory: intact for recent and remote events Mood and affect: no depression, anxiety, or agitation     DATA REVIEWED:  Lab Results  Component Value Date   HGBA1C 11.7 (A) 12/20/2018   HGBA1C 8.3 (H) 07/17/2018   HGBA1C 10.2 (H) 03/11/2018   Lab Results  Component Value Date   MICROALBUR 2.9 06/14/2017   LDLCALC 40 07/17/2018   CREATININE 1.55 (H) 10/23/2018   Lab Results  Component Value Date   MICRALBCREAT 81 (H) 06/14/2017     Lab Results  Component Value Date   CHOL 90 07/17/2018   HDL 35 (L) 07/17/2018   LDLCALC 40 07/17/2018   TRIG 74 07/17/2018   CHOLHDL 2.6 07/17/2018         ASSESSMENT / PLAN / RECOMMENDATIONS:   1)Type 2 Diabetes Mellitus, Sub-Optimally controlled, With CKD III and macrovascular complications - Most recent A1c of 11.7 %. Goal A1c < 7.5 %.     - Poorly controlled diabetes  due to dietary indiscretions and medication non-complianced, she loves to snack, per sister she refuses her lunch dose prandial insulin  - Sister is questioning add-on therapy as she is personally on truclicity , we did discuss the pt has CKD which limits add-ion therapy due to increased risk of hypoglycemia.  - IN the past week her BG's have been acceptable because the sister moved in, prior to that her BG's > 200 as the daughter was caretaker.  - It will be challenging to  keep her sugars at optimal control, especially is she is going to have multiple care providers, today we were unable to download the freestyle libre, because apparently the daughter  has the app on her phone etc.     MEDICATIONS:  Continue Toujeo 40 units daily   Increase Novolog to 14 units TID QAC  CF : BG- 150/30)  EDUCATION / INSTRUCTIONS:  BG monitoring instructions: Patient is instructed to check her blood sugars 4 times a day, before meals and bedtime.  Call [facility] if: BG persistently < 70 or > 300. . I reviewed the Rule of 15 for the treatment of hypoglycemia in detail with the patient. Literature supplied.    F/U in 3 months    Signed electronically by: Mack Guise, MD  [facility]  [facility] [address] [place], [place] [address] Phone: [phone] FAX: [phone]   CC: [name], NP [place] [address] Phone: [phone]  Fax: [phone]  Return to Endocrinology clinic as below: Future Appointments  Date Time Provider [place]  12/24/2018 11:00 AM [facility] LAB 2 [facility] None  12/24/2018 11:40 AM  [name], MD [facility] None  03/24/2019  1:00 PM , [name], MD [facility] None  04/14/2019  1:30 PM [name], NP [facility] None  07/23/2019  1:30 PM Burtis Junes, NP [facility]

## 2018-12-23 ENCOUNTER — Telehealth: Payer: Self-pay | Admitting: Internal Medicine

## 2018-12-23 NOTE — Progress Notes (Signed)
.    Hematology oncology Clinic Followup Note  Date of service 12/24/18   Patient Care Team: [name], NP as PCP - General (Geriatric Medicine) [name], [name], MD (Inactive) as Consulting Physician (Cardiology) [name], MD as Consulting Physician (Ophthalmology) [name], MD as Consulting Physician (General Surgery) [name], MD (Inactive) as Referring Physician (Obstetrics and Gynecology) [name] [name], MD as Consulting Physician (Ophthalmology)  CHIEF COMPLAINTS/PURPOSE OF CONSULTATION:  F/u for breast cancer.  DIAGNOSIS: left-sided presumed stage IA  (pT1c,pNx[cN0], cM0) invasive ductal carcinoma grade 2 out of 3, strongly ER +100%, strongly PR +100% and HER-2/neu negative. Given her high risk cardiac status lumpectomy had to be done under local anesthesia and sentinel lymph node biopsy was not possible. She has accompanying DCIS of intermediate grade present at margins. ECOG performance status is 3. -Had a mammogram/tomosynthesis done on 07/29/2015 which shows residual suspicious calcification in the left breast postero-medial to the biopsy site. Has known residual DCIS.  No evidence of new malignancy.  No evidence of right breast malignancy.   CURRENT TREATMENT: Arimidex 27m po daily  HISTORY OF PRESENTING ILLNESS: -please see my initial consultation for details of initial presentation   INTERVAL HISTORY  Mrs. CLavinis here for management and evaluation of her breast cancer. [name] last visit with [facility] on 05/28/2018. The pt reports that she is doing well overall.  The pt reports that she feels better and has been eating well. She has been taking precautions during the pandemic.   Of note since the patient's last visit, patient had a chest xray completed on 08/27/2018 with results revealing no edema or consolidation. Stable cardiac silhouette.   She also had a head CT without contrast completed on 08/27/2018 with results revealing No acute  intracranial pathology.  Small-vessel white matter disease..  Finally, she had a digital diagnostic bilateral mammogram with tomography completed on 12/12/2018 with results revealing Breast Density Category B. Stable appearance of both breasts. Stable appearance of suspicious calcifications in the anterior aspect of the LEFT breast.   Lab results today (12/24/18) of CBC w/diff and CMP is as follows: all values are WNL except for WBC 13.0 K, RBC 3.27, Hemoglobin 9.6, HCT 30.8, Neutro Abs 9.5, Glucose,Bld 293, BUN 53, Creatine 1.73, Albumin 2.9, AST 11, GFR Est Non Af Am 27, GFR Est AFR Am 31. Ferritin was 185. Vitamin D in process.   On review of systems, pt reports lower energy and impaired memory, worse since last visit, constipation and body aches and denies any joint pain, any significant weight changes or hot flashes and any other symptoms.     MEDICAL HISTORY:  Past Medical History:  Diagnosis Date   Allergy    takes Mucinex daily as needed   Anemia, unspecified    Anxiety    takes Clonazepam daily as needed   Arthritis    Cancer (HCC)    breast   CHF (congestive heart failure) (HCC)    takes Furosemide daily   CKD (chronic kidney disease)    Coronary artery disease    a. s/p IMI 2004 tx with BMS to RCA;  b. s/p Promus DES to RCA 2/12 c. 06/2014 High risk stress test follow up cath 06/2014 showd patent stent--< med therapy for other mild to moedrate disease    Coronary atherosclerosis of native coronary artery    Depression    takes Cymbalta daily   Diabetes mellitus    insulin daily   Dysuria  GERD (gastroesophageal reflux disease)    takes Protonix daily   Gout, unspecified    takes Colchicine daily   Headache    occasionally   History of blood clots about 67yr ago   in legs-takes Coumadin    Hyperlipidemia    takes Pravastatin daily   Hypertension    takes Imdur and Metoprolol daily   Insomnia    Joint pain    Joint swelling    Myocardial  infarction ([facility] 2004   Numbness    Obstructive sleep apnea    does not wear cpap   Osteoarthritis    Osteoarthritis    Osteoarthrosis, unspecified whether generalized or localized, lower leg    Pain, chronic    Polymyalgia rheumatica (HCC)    Pulmonary emboli ([facility] 9/13   felt to need lifelong anticoagulation   Shortness of breath dyspnea    with exertion and has Albuterol inhaler prn   Type II or unspecified type diabetes mellitus without mention of complication, uncontrolled    Urinary incontinence    takes Linzess daily   Vertigo    hx of;was taking Meclizine if needed   Wears dentures     SURGICAL HISTORY: Past Surgical History:  Procedure Laterality Date   ABDOMINAL HYSTERECTOMY     partial   APPENDECTOMY     blood clots/legs and lungs  2013   BREAST BIOPSY Left 07/22/2014   BREAST BIOPSY Left 02/10/2013   BREAST LUMPECTOMY Left 11/05/2014   BREAST LUMPECTOMY WITH RADIOACTIVE SEED LOCALIZATION Left 11/05/2014   Procedure: LEFT BREAST LUMPECTOMY WITH RADIOACTIVE SEED LOCALIZATION;  Surgeon: DCoralie Keens MD;  [facility]  Service: General;  Laterality: Left;   CARDIAC CATHETERIZATION     COLONOSCOPY     CORONARY ANGIOPLASTY  2   ESOPHAGOGASTRODUODENOSCOPY (EGD) WITH PROPOFOL N/A 11/07/2016   Procedure: ESOPHAGOGASTRODUODENOSCOPY (EGD) WITH PROPOFOL;  Surgeon: GGatha Mayer MD;  [facility];  Service: Endoscopy;  Laterality: N/A;   EXCISION OF SKIN TAG Right 11/05/2014   Procedure: EXCISION OF RIGHT EYELID SKIN TAG;  Surgeon: DCoralie Keens MD;  [facility]  Service: General;  Laterality: Right;   EYE SURGERY Bilateral    cataract    GASTRIC BYPASS  1977    [place] 06/[place]/2014   Procedure: LEFT HEART CATHETERIZATION WITH CORONARY ANGIOGRAM;  Surgeon: TTroy Sine MD;  [facility];  Service: Cardiovascular;  Laterality: N/A;    MEMBRANE PEEL Right 10/23/2018   Procedure: MEMBRANE PEEL;  Surgeon: RHurman Horn MD;  [facility]  Service: Ophthalmology;  Laterality: Right;   MI with stent placement  2004   PARS PLANA VITRECTOMY Right 10/23/2018   Procedure: PARS PLANA VITRECTOMY WITH 25 GAUGE;  Surgeon: RHurman Horn MD;  [facility]  Service: Ophthalmology;  Laterality: Right;    SOCIAL HISTORY: Social History   Socioeconomic History   Marital status: Widowed    Spouse name: Not on file   Number of children: Not on file   Years of education: Not on file   Highest education level: Not on file  Occupational History   Occupation: retired  SScientist, 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    Worry: Never true    Inability: Never true   Transportation needs    Medical: Yes    Non-medical: Yes  Tobacco Use   Smoking status: Never  Smoker   Smokeless tobacco: Never Used  Substance and Sexual Activity   Alcohol use: No    Alcohol/week: 0.0 standard drinks   Drug use: No   Sexual activity: Not Currently  Lifestyle   Physical activity    Days per week: 0 days    Minutes per session: 0 min   Stress: Rather much  Relationships   Social connections    Talks on phone: Once a week    Gets together: Three times a week    Attends religious service: 1 to 4 times per year    Active member of club or organization: Yes    Attends meetings of clubs or organizations: 1 to 4 times per year    Relationship status: Widowed   Intimate partner violence    Fear of current or ex partner: No    Emotionally abused: No    Physically abused: No    Forced sexual activity: No  Other Topics Concern   Not on file  Social History Narrative   Not on file    FAMILY HISTORY: Family History  Problem Relation Age of Onset   Breast cancer Mother 23   Heart disease Mother    Throat cancer Father    Hypertension Father    Arthritis Father    Diabetes  Father    Arthritis Sister    Obesity Sister    Diabetes Sister    Heart disease Cousin    Colon cancer Neg Hx    Stomach cancer Neg Hx    Esophageal cancer Neg Hx     ALLERGIES:  is allergic to sulfonamide derivatives; aricept [donepezil hcl]; and tramadol.  MEDICATIONS:  Current Outpatient Medications  Medication Sig Dispense Refill   albuterol (PROAIR HFA) 108 (90 Base) MCG/ACT inhaler Inhale 1-2 puffs into the lungs every 6 (six) hours as needed for wheezing or shortness of breath. 6.7 g 1   allopurinol (ZYLOPRIM) 300 MG tablet Take 1 tablet by mouth daily.     anastrozole (ARIMIDEX) 1 MG tablet TAKE 1 TABLET BY MOUTH EVERY DAY 90 tablet 1   budesonide-formoterol (SYMBICORT) 160-4.5 MCG/ACT inhaler Inhale 2 puffs into the lungs 2 (two) times daily. For bronchitis 1 Inhaler 3   clonazePAM (KLONOPIN) 1 MG tablet Take two tablets by mouth at bedtime as needed for anxiety 60 tablet 0   Continuous Blood Gluc Receiver (FREESTYLE LIBRE 14 DAY READER) DEVI 1 Device by Does not apply route 4 (four) times daily -  before meals and at bedtime. For diabetes 1 Device 12   Continuous Blood Gluc Sensor (FREESTYLE LIBRE 14 DAY SENSOR) MISC 1 Units by Does not apply route 4 (four) times daily -  before meals and at bedtime. For diabetes 2 each 12   DULoxetine (CYMBALTA) 60 MG capsule Take one capsule by mouth once daily for anxiety 90 capsule 1   febuxostat (ULORIC) 40 MG tablet Take 1 tablet (40 mg total) by mouth daily. (Patient not taking: Reported on 12/20/2018) 30 tablet 2   furosemide (LASIX) 20 MG tablet Take 1 tablet (20 mg total) by mouth daily as needed. 30 tablet 6   hydrALAZINE (APRESOLINE) 50 MG tablet TAKE 1 TABLET BY MOUTH 2 TIMES DAILY 60 tablet 3   insulin aspart (NOVOLOG FLEXPEN) 100 UNIT/ML FlexPen Inject 12 Units into the skin 3 (three) times daily with meals. Max 45 units 15 mL 3   Insulin Glargine, 2 Unit Dial, (TOUJEO MAX SOLOSTAR) 300 UNIT/ML SOPN Inject 40  Units into  the skin daily. Patient assistance program provides 10 pen 11   isosorbide mononitrate (IMDUR) 60 MG 24 hr tablet Take one tablet by mouth once daily 90 tablet 1   magnesium oxide (MAG-OX) 400 MG tablet Take 400 mg by mouth daily.     meclizine (ANTIVERT) 25 MG tablet Take one tablet by mouth three times daily as needed for diziness 60 tablet 0   Melatonin 3 MG TABS Take 1 tablet (3 mg total) by mouth at bedtime. (Patient not taking: Reported on 12/20/2018) 30 tablet 0   memantine (NAMENDA) 5 MG tablet TAKE 2 TABLETS BY MOUTH EVERY MORNING and TAKE 1 TABLET BY MOUTH EVERY EVENING FOR MEMORY 90 tablet 1   metoprolol succinate (TOPROL-XL) 25 MG 24 hr tablet Take one tablet by mouth once daily for blood pressure 90 tablet 1   nitroGLYCERIN (NITROSTAT) 0.4 MG SL tablet Take one tablet under the tongue every 5 minutes as needed for chest pain 50 tablet 0   oxyCODONE-acetaminophen (PERCOCET) 10-325 MG tablet Take 1 tablet by mouth every 6 (six) hours. 120 tablet 0   pantoprazole (PROTONIX) 40 MG tablet Take 40 mg by mouth daily.     pravastatin (PRAVACHOL) 20 MG tablet Take one tablet by mouth once daily for cholesterol 90 tablet 1   senna-docusate (SENOKOT-S) 8.6-50 MG tablet Take 1 tablet by mouth at bedtime. (Patient not taking: Reported on 12/20/2018)     sertraline (ZOLOFT) 25 MG tablet Take 1 tablet (25 mg total) by mouth daily. 30 tablet 0   sodium polystyrene (KAYEXALATE) 15 GM/60ML suspension 15 gm by mouth weekly to keep potassium down. 473 mL 2   XARELTO 10 MG TABS tablet TAKE 1 TABLET BY MOUTH EVERY DAY 90 tablet 1   No current facility-administered medications for this visit.     REVIEW OF SYSTEMS:   A 10+ POINT REVIEW OF SYSTEMS WAS OBTAINED including neurology, dermatology, psychiatry, cardiac, respiratory, lymph, extremities, GI, GU, Musculoskeletal, constitutional, breasts, reproductive, HEENT.  All pertinent positives are noted in the HPI.  All others are  negative.    PHYSICAL EXAMINATION:  ECOG PERFORMANCE STATUS: 3 - Symptomatic, >50% confined to bed  Vitals:   12/24/18 1139  BP: (!) 157/73  Pulse: 77  Resp: 18  Temp: 98.2 F (36.8 C)  SpO2: 100%   Filed Weights   12/24/18 1139  Weight: (!) 325 lb (147.4 kg)   Exam was given in a wheelchair.   GENERAL:alert, in no acute distress and comfortable SKIN: no acute rashes, no significant lesions EYES: conjunctiva are pink and non-injected, sclera anicteric OROPHARYNX: MMM, no exudates, no oropharyngeal erythema or ulceration NECK: supple, no JVD LYMPH:  no palpable lymphadenopathy in the cervical, axillary or inguinal regions. LUNGS: clear to auscultation b/l with normal respiratory effort HEART: regular rate & rhythm ABDOMEN:  normoactive bowel sounds , non tender, not distended. No pain in the abdomen.  Extremity: no pedal edema PSYCH: alert & oriented x 3 with fluent speech NEURO: no focal motor/sensory deficits   LABORATORY DATA:  I have reviewed the data as listed  . CBC Latest Ref Rng & Units 12/24/2018 10/23/2018 08/27/2018  WBC 4.0 - 10.5 K/uL 13.0(H) 13.9(H) 13.3(H)  Hemoglobin 12.0 - 15.0 g/dL 9.6(L) 11.2(L) 10.3(L)  Hematocrit 36.0 - 46.0 % 30.8(L) 36.6 33.4(L)  Platelets 150 - 400 K/uL 274 325 281   . CBC    Component Value Date/Time   WBC 13.0 (H) 12/24/2018 1114   RBC 3.27 (L) 12/24/2018 1114  HGB 9.6 (L) 12/24/2018 1114   HGB 8.5 (L) 10/23/2017 1418   HGB 8.7 (L) 03/21/2017 1351   HCT 30.8 (L) 12/24/2018 1114   HCT 28.9 (L) 03/21/2017 1351   PLT 274 12/24/2018 1114   PLT 317 10/23/2017 1418   PLT 295 03/21/2017 1351   MCV 94.2 12/24/2018 1114   MCV 84.3 03/21/2017 1351   MCH 29.4 12/24/2018 1114   MCHC 31.2 12/24/2018 1114   RDW 13.9 12/24/2018 1114   RDW 15.5 (H) 03/21/2017 1351   LYMPHSABS 2.5 12/24/2018 1114   LYMPHSABS 2.4 03/21/2017 1351   MONOABS 0.7 12/24/2018 1114   MONOABS 0.7 03/21/2017 1351   EOSABS 0.3 12/24/2018 1114   EOSABS  0.2 03/21/2017 1351   BASOSABS 0.0 12/24/2018 1114   BASOSABS 0.0 03/21/2017 1351   . CMP Latest Ref Rng & Units 12/24/2018 10/23/2018 08/27/2018  Glucose 70 - 99 mg/dL 293(H) 212(H) 267(H)  BUN 8 - 23 mg/dL 53(H) 39(H) 44(H)  Creatinine 0.44 - 1.00 mg/dL 1.73(H) 1.55(H) 1.65(H)  Sodium 135 - 145 mmol/L 137 138 136  Potassium 3.5 - 5.1 mmol/L 5.1 4.4 5.1  Chloride 98 - 111 mmol/L 105 104 105  CO2 22 - 32 mmol/L 23 23 20(L)  Calcium 8.9 - 10.3 mg/dL 9.1 9.2 9.1  Total Protein 6.5 - 8.1 g/dL 7.9 - 7.9  Total Bilirubin 0.3 - 1.2 mg/dL 0.4 - 0.8  Alkaline Phos 38 - 126 U/L 91 - 70  AST 15 - 41 U/L 11(L) - 16  ALT 0 - 44 U/L 11 - 14        RADIOGRAPHIC STUDIES: I have personally reviewed the radiological images as listed and agreed with the findings in the report.  Mm Diag Breast Tomo Bilateral  Result Date: 12/12/2018 CLINICAL DATA:  Patient has a history of LEFT breast cancer, status post lumpectomy. Margins were positive at the time of surgery. Due to the patient's comorbidities, it was decided not performed re-excision. The patient takes tamoxifen.The patient is in a wheelchair and is accompanied by her grandson. The patient has known suspicious microcalcifications posterior to lumpectomy site. EXAM: DIGITAL DIAGNOSTIC BILATERAL MAMMOGRAM WITH CAD AND TOMO COMPARISON:  Previous exam(s). ACR Breast Density Category b: There are scattered areas of fibroglandular density. FINDINGS: Postoperative changes are identified in the anterior portion of the LEFT breast. Posterior to the lumpectomy site, there are numerous fine linear calcifications, stable in appearance compared to previous studies and consistent with DCIS. RIGHT breast is negative. Study quality is degraded by limited mobility and patient body habitus. Mammographic images were processed with CAD. IMPRESSION: Stable appearance of both breasts. Stable appearance of suspicious calcifications in the anterior aspect of the LEFT breast.  RECOMMENDATION: Diagnostic mammogram is suggested in 1 year. (Code:DM-B-01Y) I have discussed the findings and recommendations with the patient. Results were also provided in writing at the conclusion of the visit. If applicable, a reminder letter will be sent to the patient regarding the next appointment. BI-RADS CATEGORY  6: Known biopsy-proven malignancy. Electronically Signed   By: Nolon Nations M.D.   On: 12/12/2018 14:35   Diagnostic mammogram b/l 11/01/17  IMPRESSION: No significant change in suspicious microcalcifications posterior to the lumpectomy site in the left breast compared to the mammogram of 2018. No suspicious findings in the right breast. Exclusion of some posterior tissue due to patient decreased mobility.  RECOMMENDATION: Diagnostic mammogram is suggested in 1 year. (Code:DM-B-01Y)  CLINICAL DATA:  Annual mammography. The patient had surgery for DCIS in  the left breast in 2016. There were positive margins at surgery but the patient could not return for additional surgery. Known suspicious calcifications remain. EXAM: 2D DIGITAL DIAGNOSTIC BILATERAL MAMMOGRAM WITH CAD AND ADJUNCT TOMO COMPARISON:  Previous exam(s). ACR Breast Density Category b: There are scattered areas of fibroglandular density. FINDINGS: The suspicious calcifications posterior to the left lumpectomy site are stable. No other interval changes or other suspicious findings. Mammographic images were processed with CAD. IMPRESSION: Continued suspicious calcifications posterior to the left lumpectomy site. No other changes. RECOMMENDATION: Continued annual mammography for surveillance. Continued oncologic follow up. I have discussed the findings and recommendations with the patient. Results were also provided in writing at the conclusion of the visit. If applicable, a reminder letter will be sent to the patient regarding the next appointment. BI-RADS CATEGORY  6: Known biopsy-proven  malignancy.   Electronically Signed   By: Dorise Bullion III M.D   On: 08/16/2016 15:21   ASSESSMENT & PLAN:   Mrs. [name] is a very wonderful 84 y.o. African-American female with multiple medical comorbidities as described above with   #1 Left-sided presumed stage IA  (pT1c,pNx[cN0], cM0) invasive ductal carcinoma grade 2 out of 3, strongly ER +100%, strongly PR +100% and HER-2/neu negative. Given her high risk cardiac status lumpectomy had to be done under local anesthesia and sentinel lymph node biopsy was not possible. She has accompanying DCIS of intermediate grade present at margins. ECOG performance status is 3. Dexa scan "low bone mass" per WHO. -Bone density scan on 03/12/2017 with results showing: T-score of 0.5 at left forearm. Lumbar spine and dual femurs not completed due to patient in a wheelchair and not able to lay flat due to SOB while laying flat.   11/01/17 Mammogram stable.   #2 significant coronary artery disease with stress test in February 2016 suggesting likely multivessel disease. Has uncontrolled diabetes, hypertension, dyslipidemia, untreated sleep apnea, coronary disease, severe arthritis all of which are significantly limiting her quality of life.  #3 vitamin D deficiency - 25OH vitamin D level of 10, s/p high dose ergocalciferol re-placement with 25OH Vit D improvement to 30.9 in 10/2017. Now on vit D 2000 units daily.   #4 Worsening Anemia ? Slow GI losses . Multiple metabolic insults in the bone marrow including uncontrolled diabetes .Patient notes that her fasting glucose and so and in the 300s   #5 Hyperkalemia K 5.5 -- in setting of CKD and ? RTA type IV from DM2  PLAN: -Discussed pt labwork today, 12/24/18; all values are WNL except for WBC 13.0 K, RBC 3.27, Hemoglobin 9.6, HCT 30.8, Neutro Abs 9.5, Glucose,Bld 293, BUN 53, Creatine 1.73, Albumin 2.9, AST 11, GFR Est Non Af Am 27, GFR Est AFR Am 31. Ferritin was 185. Vitamin D is 35.6 -Discussed  08/27/2018 chest xray with results revealing No acute intracranial pathology.  Small-vessel white matter disease. -Discussed 08/27/2018 head CT without contrast with results revealing No acute intracranial pathology.  Small-vessel white matter disease. -Discussed 12/12/2018 digital diagnostic bilateral mammogram with tomography with results revealing Breast Density Category B. Stable appearance of both breasts. Stable appearance of suspicious calcifications in the anterior aspect of the LEFT breast.  - Discussed cancer stability  - Discussed reasons for patient feeling cold, including anemia, sedentary lifestyle, different food choices or weight loss.  - Discussed PCP recommended to check thyroid function.  - Discussed recent visit with her Endocrinologist.  - Discussed variations in testing results of CBC  - Discussed issues that could  arise from the addition of the hormone shot. Her cancer is hormone receptor positive.  - Discussed continuing oral iron supplements to assist with anemia.  - Discussed patient safety during the pandemic.  - Discussed current medications with her and her familial assistance. The pt has no prohibitive toxicities from continuing Arimidex at this time.   FOLLOW UP: RTC with Dr Irene Limbo in 6 months w Labs   Continue follow-up with your primary care physician Dr. Eulas Post for other ongoing cares.  The total time spent in the appt was 20 minutes and more than 50% was on counseling and direct patient cares.    Sullivan Lone MD MS Hematology/Oncology Physician [facility]  (Office):       [phone] (Work cell):  [phone] (Fax):           [phone]  I, Jacqualyn Posey, am acting as a scribe for Dr. Sullivan Lone.   .I have reviewed the above documentation for accuracy and completeness, and I agree with the above. [name] MD

## 2018-12-23 NOTE — Telephone Encounter (Signed)
Link has been sent.

## 2018-12-23 NOTE — Telephone Encounter (Signed)
Patients family called and asked that we resent the link to the [employer] upload  Email address to send to is:  [email or web address]

## 2018-12-24 ENCOUNTER — Inpatient Hospital Stay (HOSPITAL_BASED_OUTPATIENT_CLINIC_OR_DEPARTMENT_OTHER): Payer: PPO | Admitting: Hematology

## 2018-12-24 ENCOUNTER — Inpatient Hospital Stay: Payer: PPO | Attending: Hematology

## 2018-12-24 ENCOUNTER — Other Ambulatory Visit: Payer: Self-pay

## 2018-12-24 VITALS — BP 157/73 | HR 77 | Temp 98.2°F | Resp 18 | Ht 67.0 in | Wt 325.0 lb

## 2018-12-24 DIAGNOSIS — Z79811 Long term (current) use of aromatase inhibitors: Secondary | ICD-10-CM | POA: Diagnosis not present

## 2018-12-24 DIAGNOSIS — E875 Hyperkalemia: Secondary | ICD-10-CM | POA: Diagnosis not present

## 2018-12-24 DIAGNOSIS — Z170 Estrogen receptor positive status [ER+]: Secondary | ICD-10-CM | POA: Insufficient documentation

## 2018-12-24 DIAGNOSIS — Z79899 Other long term (current) drug therapy: Secondary | ICD-10-CM | POA: Diagnosis not present

## 2018-12-24 DIAGNOSIS — C50112 Malignant neoplasm of central portion of left female breast: Secondary | ICD-10-CM | POA: Diagnosis not present

## 2018-12-24 DIAGNOSIS — D649 Anemia, unspecified: Secondary | ICD-10-CM | POA: Diagnosis not present

## 2018-12-24 DIAGNOSIS — Z794 Long term (current) use of insulin: Secondary | ICD-10-CM | POA: Diagnosis not present

## 2018-12-24 DIAGNOSIS — E559 Vitamin D deficiency, unspecified: Secondary | ICD-10-CM | POA: Diagnosis not present

## 2018-12-24 DIAGNOSIS — E119 Type 2 diabetes mellitus without complications: Secondary | ICD-10-CM | POA: Insufficient documentation

## 2018-12-24 LAB — CMP (CANCER CENTER ONLY)
ALT: 11 U/L (ref 0–44)
AST: 11 U/L — ABNORMAL LOW (ref 15–41)
Albumin: 2.9 g/dL — ABNORMAL LOW (ref 3.5–5.0)
Alkaline Phosphatase: 91 U/L (ref 38–126)
Anion gap: 9 (ref 5–15)
BUN: 53 mg/dL — ABNORMAL HIGH (ref 8–23)
CO2: 23 mmol/L (ref 22–32)
Calcium: 9.1 mg/dL (ref 8.9–10.3)
Chloride: 105 mmol/L (ref 98–111)
Creatinine: 1.73 mg/dL — ABNORMAL HIGH (ref 0.44–1.00)
GFR, Est AFR Am: 31 mL/min — ABNORMAL LOW (ref >60)
GFR, Estimated: 27 mL/min — ABNORMAL LOW (ref >60)
Glucose, Bld: 293 mg/dL — ABNORMAL HIGH (ref 70–99)
Potassium: 5.1 mmol/L (ref 3.5–5.1)
Sodium: 137 mmol/L (ref 135–145)
Total Bilirubin: 0.4 mg/dL (ref 0.3–1.2)
Total Protein: 7.9 g/dL (ref 6.5–8.1)

## 2018-12-24 LAB — CBC WITH DIFFERENTIAL/PLATELET
Abs Immature Granulocytes: 0.06 10*3/uL (ref 0.00–0.07)
Basophils Absolute: 0 10*3/uL (ref 0.0–0.1)
Basophils Relative: 0 %
Eosinophils Absolute: 0.3 10*3/uL (ref 0.0–0.5)
Eosinophils Relative: 2 %
HCT: 30.8 % — ABNORMAL LOW (ref 36.0–46.0)
Hemoglobin: 9.6 g/dL — ABNORMAL LOW (ref 12.0–15.0)
Immature Granulocytes: 1 %
Lymphocytes Relative: 19 %
Lymphs Abs: 2.5 10*3/uL (ref 0.7–4.0)
MCH: 29.4 pg (ref 26.0–34.0)
MCHC: 31.2 g/dL (ref 30.0–36.0)
MCV: 94.2 fL (ref 80.0–100.0)
Monocytes Absolute: 0.7 10*3/uL (ref 0.1–1.0)
Monocytes Relative: 5 %
Neutro Abs: 9.5 10*3/uL — ABNORMAL HIGH (ref 1.7–7.7)
Neutrophils Relative %: 73 %
Platelets: 274 10*3/uL (ref 150–400)
RBC: 3.27 MIL/uL — ABNORMAL LOW (ref 3.87–5.11)
RDW: 13.9 % (ref 11.5–15.5)
WBC: 13 10*3/uL — ABNORMAL HIGH (ref 4.0–10.5)
nRBC: 0 % (ref 0.0–0.2)

## 2018-12-24 LAB — FERRITIN: Ferritin: 185 ng/mL (ref 11–307)

## 2018-12-25 ENCOUNTER — Telehealth: Payer: Self-pay | Admitting: Hematology

## 2018-12-25 DIAGNOSIS — Z09 Encounter for follow-up examination after completed treatment for conditions other than malignant neoplasm: Secondary | ICD-10-CM | POA: Diagnosis not present

## 2018-12-25 DIAGNOSIS — H35371 Puckering of macula, right eye: Secondary | ICD-10-CM | POA: Diagnosis not present

## 2018-12-25 LAB — VITAMIN D 25 HYDROXY (VIT D DEFICIENCY, FRACTURES): Vit D, 25-Hydroxy: 35.6 ng/mL (ref 30.0–100.0)

## 2018-12-25 NOTE — Telephone Encounter (Signed)
Per 8/18 los, appt already scheduled.  Spoke with patient daughter and she is aware of the appt date and time.

## 2018-12-30 ENCOUNTER — Encounter (HOSPITAL_COMMUNITY)
Admission: RE | Admit: 2018-12-30 | Discharge: 2018-12-30 | Disposition: A | Discharge status: Home / Self Care | Payer: PPO | Source: Ambulatory Visit | Attending: Nephrology | Admitting: Nephrology

## 2018-12-30 ENCOUNTER — Other Ambulatory Visit: Payer: Self-pay

## 2018-12-30 VITALS — BP 138/64 | HR 80 | Resp 20

## 2018-12-30 DIAGNOSIS — N183 Chronic kidney disease, stage 3 unspecified: Secondary | ICD-10-CM

## 2018-12-30 LAB — POCT HEMOGLOBIN-HEMACUE: Hemoglobin: 9.1 g/dL — ABNORMAL LOW (ref 12.0–15.0)

## 2018-12-30 MED ORDER — EPOETIN ALFA-EPBX 10000 UNIT/ML IJ SOLN
10000.0000 [IU] | INTRAMUSCULAR | Status: DC
  Administered 2018-12-30: 10000 [IU] via SUBCUTANEOUS
  Filled 2018-12-30: qty 1

## 2018-12-30 NOTE — Discharge Instructions (Signed)

## 2018-12-31 DIAGNOSIS — N764 Abscess of vulva: Secondary | ICD-10-CM | POA: Diagnosis not present

## 2019-01-13 DIAGNOSIS — Z6841 Body Mass Index (BMI) 40.0 and over, adult: Secondary | ICD-10-CM | POA: Diagnosis not present

## 2019-01-13 DIAGNOSIS — Z20828 Contact with and (suspected) exposure to other viral communicable diseases: Secondary | ICD-10-CM | POA: Diagnosis not present

## 2019-01-13 DIAGNOSIS — Z719 Counseling, unspecified: Secondary | ICD-10-CM | POA: Diagnosis not present

## 2019-01-14 ENCOUNTER — Ambulatory Visit (HOSPITAL_COMMUNITY)
Admission: RE | Admit: 2019-01-14 | Discharge: 2019-01-14 | Disposition: A | Discharge status: Home / Self Care | Payer: PPO | Source: Ambulatory Visit | Attending: Nephrology | Admitting: Nephrology

## 2019-01-14 ENCOUNTER — Other Ambulatory Visit: Payer: Self-pay

## 2019-01-14 VITALS — BP 111/56 | HR 87 | Temp 96.8°F | Resp 20

## 2019-01-14 DIAGNOSIS — N183 Chronic kidney disease, stage 3 unspecified: Secondary | ICD-10-CM

## 2019-01-14 LAB — POCT HEMOGLOBIN-HEMACUE: Hemoglobin: 10.1 g/dL — ABNORMAL LOW (ref 12.0–15.0)

## 2019-01-14 MED ORDER — EPOETIN ALFA-EPBX 10000 UNIT/ML IJ SOLN
10000.0000 [IU] | INTRAMUSCULAR | Status: DC
  Administered 2019-01-14: 10000 [IU] via SUBCUTANEOUS
  Filled 2019-01-14: qty 1

## 2019-01-17 ENCOUNTER — Other Ambulatory Visit: Payer: Self-pay | Admitting: *Deleted

## 2019-01-17 MED ORDER — PANTOPRAZOLE SODIUM 40 MG PO TBEC: 40.0000 mg | DELAYED_RELEASE_TABLET | Freq: Every day | ORAL | 0 refills | Status: DC

## 2019-01-17 NOTE — Telephone Encounter (Signed)
[facility] requested refill

## 2019-01-20 ENCOUNTER — Other Ambulatory Visit: Payer: Self-pay | Admitting: *Deleted

## 2019-01-20 DIAGNOSIS — G894 Chronic pain syndrome: Secondary | ICD-10-CM

## 2019-01-20 MED ORDER — OXYCODONE-ACETAMINOPHEN 10-325 MG PO TABS: 1.0000 | ORAL_TABLET | Freq: Four times a day (QID) | ORAL | 0 refills | Status: DC

## 2019-01-20 NOTE — Telephone Encounter (Signed)
Patient requested refill Epic LR: 12/19/2018 Pended Rx and sent to [facility] for approval.

## 2019-01-24 ENCOUNTER — Inpatient Hospital Stay (HOSPITAL_COMMUNITY)
Admission: EM | Admit: 2019-01-24 | Discharge: 2019-01-30 | DRG: 177 | Disposition: A | Discharge status: Home Health | Payer: PPO | Attending: Internal Medicine | Admitting: Internal Medicine

## 2019-01-24 ENCOUNTER — Encounter (HOSPITAL_COMMUNITY): Payer: Self-pay | Admitting: Emergency Medicine

## 2019-01-24 ENCOUNTER — Emergency Department (HOSPITAL_COMMUNITY): Payer: PPO

## 2019-01-24 ENCOUNTER — Other Ambulatory Visit: Payer: Self-pay

## 2019-01-24 DIAGNOSIS — F0390 Unspecified dementia without behavioral disturbance: Secondary | ICD-10-CM | POA: Diagnosis present

## 2019-01-24 DIAGNOSIS — I130 Hypertensive heart and chronic kidney disease with heart failure and stage 1 through stage 4 chronic kidney disease, or unspecified chronic kidney disease: Secondary | ICD-10-CM | POA: Diagnosis present

## 2019-01-24 DIAGNOSIS — E1165 Type 2 diabetes mellitus with hyperglycemia: Secondary | ICD-10-CM | POA: Diagnosis not present

## 2019-01-24 DIAGNOSIS — Z885 Allergy status to narcotic agent status: Secondary | ICD-10-CM

## 2019-01-24 DIAGNOSIS — I10 Essential (primary) hypertension: Secondary | ICD-10-CM | POA: Diagnosis present

## 2019-01-24 DIAGNOSIS — Z86718 Personal history of other venous thrombosis and embolism: Secondary | ICD-10-CM | POA: Diagnosis not present

## 2019-01-24 DIAGNOSIS — U071 COVID-19: Secondary | ICD-10-CM | POA: Diagnosis present

## 2019-01-24 DIAGNOSIS — I2510 Atherosclerotic heart disease of native coronary artery without angina pectoris: Secondary | ICD-10-CM | POA: Diagnosis present

## 2019-01-24 DIAGNOSIS — J9601 Acute respiratory failure with hypoxia: Secondary | ICD-10-CM | POA: Diagnosis not present

## 2019-01-24 DIAGNOSIS — E861 Hypovolemia: Secondary | ICD-10-CM | POA: Diagnosis present

## 2019-01-24 DIAGNOSIS — E876 Hypokalemia: Secondary | ICD-10-CM | POA: Diagnosis present

## 2019-01-24 DIAGNOSIS — E785 Hyperlipidemia, unspecified: Secondary | ICD-10-CM | POA: Diagnosis present

## 2019-01-24 DIAGNOSIS — Z86711 Personal history of pulmonary embolism: Secondary | ICD-10-CM | POA: Diagnosis not present

## 2019-01-24 DIAGNOSIS — F329 Major depressive disorder, single episode, unspecified: Secondary | ICD-10-CM | POA: Diagnosis present

## 2019-01-24 DIAGNOSIS — R079 Chest pain, unspecified: Secondary | ICD-10-CM | POA: Diagnosis not present

## 2019-01-24 DIAGNOSIS — Z7901 Long term (current) use of anticoagulants: Secondary | ICD-10-CM | POA: Diagnosis not present

## 2019-01-24 DIAGNOSIS — Z9841 Cataract extraction status, right eye: Secondary | ICD-10-CM

## 2019-01-24 DIAGNOSIS — I5032 Chronic diastolic (congestive) heart failure: Secondary | ICD-10-CM | POA: Diagnosis present

## 2019-01-24 DIAGNOSIS — E1122 Type 2 diabetes mellitus with diabetic chronic kidney disease: Secondary | ICD-10-CM | POA: Diagnosis present

## 2019-01-24 DIAGNOSIS — Z833 Family history of diabetes mellitus: Secondary | ICD-10-CM

## 2019-01-24 DIAGNOSIS — Z853 Personal history of malignant neoplasm of breast: Secondary | ICD-10-CM

## 2019-01-24 DIAGNOSIS — J988 Other specified respiratory disorders: Secondary | ICD-10-CM | POA: Diagnosis not present

## 2019-01-24 DIAGNOSIS — J1289 Other viral pneumonia: Secondary | ICD-10-CM | POA: Diagnosis present

## 2019-01-24 DIAGNOSIS — J440 Chronic obstructive pulmonary disease with acute lower respiratory infection: Secondary | ICD-10-CM | POA: Diagnosis present

## 2019-01-24 DIAGNOSIS — Z9842 Cataract extraction status, left eye: Secondary | ICD-10-CM

## 2019-01-24 DIAGNOSIS — Z803 Family history of malignant neoplasm of breast: Secondary | ICD-10-CM

## 2019-01-24 DIAGNOSIS — I2583 Coronary atherosclerosis due to lipid rich plaque: Secondary | ICD-10-CM | POA: Diagnosis not present

## 2019-01-24 DIAGNOSIS — E875 Hyperkalemia: Secondary | ICD-10-CM | POA: Diagnosis present

## 2019-01-24 DIAGNOSIS — Z955 Presence of coronary angioplasty implant and graft: Secondary | ICD-10-CM

## 2019-01-24 DIAGNOSIS — E1121 Type 2 diabetes mellitus with diabetic nephropathy: Secondary | ICD-10-CM | POA: Diagnosis not present

## 2019-01-24 DIAGNOSIS — N183 Chronic kidney disease, stage 3 (moderate): Secondary | ICD-10-CM | POA: Diagnosis present

## 2019-01-24 DIAGNOSIS — Z66 Do not resuscitate: Secondary | ICD-10-CM | POA: Diagnosis present

## 2019-01-24 DIAGNOSIS — Z90711 Acquired absence of uterus with remaining cervical stump: Secondary | ICD-10-CM

## 2019-01-24 DIAGNOSIS — R0602 Shortness of breath: Secondary | ICD-10-CM

## 2019-01-24 DIAGNOSIS — N898 Other specified noninflammatory disorders of vagina: Secondary | ICD-10-CM | POA: Diagnosis not present

## 2019-01-24 DIAGNOSIS — E66813 Obesity, class 3: Secondary | ICD-10-CM | POA: Diagnosis present

## 2019-01-24 DIAGNOSIS — D631 Anemia in chronic kidney disease: Secondary | ICD-10-CM | POA: Diagnosis present

## 2019-01-24 DIAGNOSIS — Z882 Allergy status to sulfonamides status: Secondary | ICD-10-CM

## 2019-01-24 DIAGNOSIS — Z888 Allergy status to other drugs, medicaments and biological substances status: Secondary | ICD-10-CM

## 2019-01-24 DIAGNOSIS — G894 Chronic pain syndrome: Secondary | ICD-10-CM | POA: Diagnosis present

## 2019-01-24 DIAGNOSIS — Y92239 Unspecified place in hospital as the place of occurrence of the external cause: Secondary | ICD-10-CM | POA: Diagnosis not present

## 2019-01-24 DIAGNOSIS — K219 Gastro-esophageal reflux disease without esophagitis: Secondary | ICD-10-CM | POA: Diagnosis present

## 2019-01-24 DIAGNOSIS — F419 Anxiety disorder, unspecified: Secondary | ICD-10-CM | POA: Diagnosis present

## 2019-01-24 DIAGNOSIS — Z794 Long term (current) use of insulin: Secondary | ICD-10-CM

## 2019-01-24 DIAGNOSIS — J449 Chronic obstructive pulmonary disease, unspecified: Secondary | ICD-10-CM | POA: Diagnosis not present

## 2019-01-24 DIAGNOSIS — Z79899 Other long term (current) drug therapy: Secondary | ICD-10-CM

## 2019-01-24 DIAGNOSIS — R5381 Other malaise: Secondary | ICD-10-CM | POA: Diagnosis not present

## 2019-01-24 DIAGNOSIS — I252 Old myocardial infarction: Secondary | ICD-10-CM

## 2019-01-24 DIAGNOSIS — Z743 Need for continuous supervision: Secondary | ICD-10-CM | POA: Diagnosis not present

## 2019-01-24 DIAGNOSIS — Z6841 Body Mass Index (BMI) 40.0 and over, adult: Secondary | ICD-10-CM | POA: Diagnosis not present

## 2019-01-24 DIAGNOSIS — E871 Hypo-osmolality and hyponatremia: Secondary | ICD-10-CM | POA: Diagnosis present

## 2019-01-24 DIAGNOSIS — Z79891 Long term (current) use of opiate analgesic: Secondary | ICD-10-CM

## 2019-01-24 DIAGNOSIS — Z8249 Family history of ischemic heart disease and other diseases of the circulatory system: Secondary | ICD-10-CM

## 2019-01-24 DIAGNOSIS — K449 Diaphragmatic hernia without obstruction or gangrene: Secondary | ICD-10-CM | POA: Diagnosis present

## 2019-01-24 DIAGNOSIS — J4489 Other specified chronic obstructive pulmonary disease: Secondary | ICD-10-CM | POA: Diagnosis present

## 2019-01-24 DIAGNOSIS — R279 Unspecified lack of coordination: Secondary | ICD-10-CM | POA: Diagnosis not present

## 2019-01-24 DIAGNOSIS — I25118 Atherosclerotic heart disease of native coronary artery with other forms of angina pectoris: Secondary | ICD-10-CM | POA: Diagnosis present

## 2019-01-24 DIAGNOSIS — N1832 Chronic kidney disease, stage 3b: Secondary | ICD-10-CM

## 2019-01-24 DIAGNOSIS — Z993 Dependence on wheelchair: Secondary | ICD-10-CM

## 2019-01-24 DIAGNOSIS — Z808 Family history of malignant neoplasm of other organs or systems: Secondary | ICD-10-CM

## 2019-01-24 DIAGNOSIS — J1282 Pneumonia due to coronavirus disease 2019: Secondary | ICD-10-CM | POA: Diagnosis present

## 2019-01-24 DIAGNOSIS — T380X5A Adverse effect of glucocorticoids and synthetic analogues, initial encounter: Secondary | ICD-10-CM | POA: Diagnosis not present

## 2019-01-24 DIAGNOSIS — Z8261 Family history of arthritis: Secondary | ICD-10-CM

## 2019-01-24 DIAGNOSIS — Z7951 Long term (current) use of inhaled steroids: Secondary | ICD-10-CM

## 2019-01-24 LAB — BASIC METABOLIC PANEL
Anion gap: 11 (ref 5–15)
BUN: 33 mg/dL — ABNORMAL HIGH (ref 8–23)
CO2: 23 mmol/L (ref 22–32)
Calcium: 8.3 mg/dL — ABNORMAL LOW (ref 8.9–10.3)
Chloride: 98 mmol/L (ref 98–111)
Creatinine, Ser: 1.71 mg/dL — ABNORMAL HIGH (ref 0.44–1.00)
GFR calc Af Amer: 32 mL/min — ABNORMAL LOW (ref >60)
GFR calc non Af Amer: 27 mL/min — ABNORMAL LOW (ref >60)
Glucose, Bld: 257 mg/dL — ABNORMAL HIGH (ref 70–99)
Potassium: 4.5 mmol/L (ref 3.5–5.1)
Sodium: 132 mmol/L — ABNORMAL LOW (ref 135–145)

## 2019-01-24 LAB — CBC
HCT: 31.5 % — ABNORMAL LOW (ref 36.0–46.0)
Hemoglobin: 9.8 g/dL — ABNORMAL LOW (ref 12.0–15.0)
MCH: 28.7 pg (ref 26.0–34.0)
MCHC: 31.1 g/dL (ref 30.0–36.0)
MCV: 92.4 fL (ref 80.0–100.0)
Platelets: 212 10*3/uL (ref 150–400)
RBC: 3.41 MIL/uL — ABNORMAL LOW (ref 3.87–5.11)
RDW: 14 % (ref 11.5–15.5)
WBC: 6 10*3/uL (ref 4.0–10.5)
nRBC: 0 % (ref 0.0–0.2)

## 2019-01-24 LAB — TROPONIN I (HIGH SENSITIVITY): Troponin I (High Sensitivity): 14 ng/L (ref <18)

## 2019-01-24 MED ORDER — SODIUM CHLORIDE 0.9% FLUSH: 3.0000 mL | Freq: Once | INTRAVENOUS | Status: DC

## 2019-01-24 NOTE — ED Triage Notes (Signed)
Pt to ED with c/o mid upper chest pain and shortness of breath.  Onset earlier today.  Describes pain as a pressure.  No nausea or vomiting but has had some diaphoresis

## 2019-01-25 ENCOUNTER — Other Ambulatory Visit: Payer: Self-pay

## 2019-01-25 DIAGNOSIS — Y92239 Unspecified place in hospital as the place of occurrence of the external cause: Secondary | ICD-10-CM | POA: Diagnosis not present

## 2019-01-25 DIAGNOSIS — Z7901 Long term (current) use of anticoagulants: Secondary | ICD-10-CM | POA: Diagnosis not present

## 2019-01-25 DIAGNOSIS — D631 Anemia in chronic kidney disease: Secondary | ICD-10-CM | POA: Diagnosis present

## 2019-01-25 DIAGNOSIS — I10 Essential (primary) hypertension: Secondary | ICD-10-CM

## 2019-01-25 DIAGNOSIS — E876 Hypokalemia: Secondary | ICD-10-CM | POA: Diagnosis present

## 2019-01-25 DIAGNOSIS — J1289 Other viral pneumonia: Secondary | ICD-10-CM

## 2019-01-25 DIAGNOSIS — J9601 Acute respiratory failure with hypoxia: Secondary | ICD-10-CM | POA: Diagnosis not present

## 2019-01-25 DIAGNOSIS — J449 Chronic obstructive pulmonary disease, unspecified: Secondary | ICD-10-CM

## 2019-01-25 DIAGNOSIS — I2510 Atherosclerotic heart disease of native coronary artery without angina pectoris: Secondary | ICD-10-CM | POA: Diagnosis present

## 2019-01-25 DIAGNOSIS — F419 Anxiety disorder, unspecified: Secondary | ICD-10-CM | POA: Diagnosis present

## 2019-01-25 DIAGNOSIS — Z794 Long term (current) use of insulin: Secondary | ICD-10-CM | POA: Diagnosis not present

## 2019-01-25 DIAGNOSIS — Z86718 Personal history of other venous thrombosis and embolism: Secondary | ICD-10-CM

## 2019-01-25 DIAGNOSIS — J1282 Pneumonia due to coronavirus disease 2019: Secondary | ICD-10-CM | POA: Diagnosis present

## 2019-01-25 DIAGNOSIS — E1121 Type 2 diabetes mellitus with diabetic nephropathy: Secondary | ICD-10-CM

## 2019-01-25 DIAGNOSIS — E871 Hypo-osmolality and hyponatremia: Secondary | ICD-10-CM | POA: Diagnosis present

## 2019-01-25 DIAGNOSIS — F329 Major depressive disorder, single episode, unspecified: Secondary | ICD-10-CM | POA: Diagnosis present

## 2019-01-25 DIAGNOSIS — Z86711 Personal history of pulmonary embolism: Secondary | ICD-10-CM | POA: Diagnosis not present

## 2019-01-25 DIAGNOSIS — E1122 Type 2 diabetes mellitus with diabetic chronic kidney disease: Secondary | ICD-10-CM | POA: Diagnosis not present

## 2019-01-25 DIAGNOSIS — G894 Chronic pain syndrome: Secondary | ICD-10-CM | POA: Diagnosis present

## 2019-01-25 DIAGNOSIS — E1165 Type 2 diabetes mellitus with hyperglycemia: Secondary | ICD-10-CM | POA: Diagnosis not present

## 2019-01-25 DIAGNOSIS — R0602 Shortness of breath: Secondary | ICD-10-CM | POA: Diagnosis present

## 2019-01-25 DIAGNOSIS — E875 Hyperkalemia: Secondary | ICD-10-CM | POA: Diagnosis not present

## 2019-01-25 DIAGNOSIS — I5032 Chronic diastolic (congestive) heart failure: Secondary | ICD-10-CM | POA: Diagnosis present

## 2019-01-25 DIAGNOSIS — I2583 Coronary atherosclerosis due to lipid rich plaque: Secondary | ICD-10-CM

## 2019-01-25 DIAGNOSIS — E861 Hypovolemia: Secondary | ICD-10-CM | POA: Diagnosis present

## 2019-01-25 DIAGNOSIS — I130 Hypertensive heart and chronic kidney disease with heart failure and stage 1 through stage 4 chronic kidney disease, or unspecified chronic kidney disease: Secondary | ICD-10-CM | POA: Diagnosis not present

## 2019-01-25 DIAGNOSIS — K219 Gastro-esophageal reflux disease without esophagitis: Secondary | ICD-10-CM

## 2019-01-25 DIAGNOSIS — E785 Hyperlipidemia, unspecified: Secondary | ICD-10-CM | POA: Diagnosis present

## 2019-01-25 DIAGNOSIS — J440 Chronic obstructive pulmonary disease with acute lower respiratory infection: Secondary | ICD-10-CM | POA: Diagnosis not present

## 2019-01-25 DIAGNOSIS — Z66 Do not resuscitate: Secondary | ICD-10-CM | POA: Diagnosis present

## 2019-01-25 DIAGNOSIS — Z6841 Body Mass Index (BMI) 40.0 and over, adult: Secondary | ICD-10-CM | POA: Diagnosis not present

## 2019-01-25 DIAGNOSIS — U071 COVID-19: Secondary | ICD-10-CM | POA: Diagnosis present

## 2019-01-25 DIAGNOSIS — N183 Chronic kidney disease, stage 3 (moderate): Secondary | ICD-10-CM | POA: Diagnosis present

## 2019-01-25 LAB — CBG MONITORING, ED: Glucose-Capillary: 223 mg/dL — ABNORMAL HIGH (ref 70–99)

## 2019-01-25 LAB — HEPATIC FUNCTION PANEL
ALT: 17 U/L (ref 0–44)
AST: 22 U/L (ref 15–41)
Albumin: 2.6 g/dL — ABNORMAL LOW (ref 3.5–5.0)
Alkaline Phosphatase: 71 U/L (ref 38–126)
Bilirubin, Direct: <0.1 mg/dL (ref 0.0–0.2)
Total Bilirubin: 0.4 mg/dL (ref 0.3–1.2)
Total Protein: 7.8 g/dL (ref 6.5–8.1)

## 2019-01-25 LAB — TYPE AND SCREEN
ABO/RH(D): A POS
Antibody Screen: NEGATIVE

## 2019-01-25 LAB — SARS CORONAVIRUS 2 BY RT PCR (HOSPITAL ORDER, PERFORMED IN ~~LOC~~ HOSPITAL LAB): SARS Coronavirus 2: POSITIVE — AB

## 2019-01-25 LAB — ABO/RH: ABO/RH(D): A POS

## 2019-01-25 LAB — BRAIN NATRIURETIC PEPTIDE: B Natriuretic Peptide: 21 pg/mL (ref 0.0–100.0)

## 2019-01-25 LAB — LACTIC ACID, PLASMA: Lactic Acid, Venous: 1.5 mmol/L (ref 0.5–1.9)

## 2019-01-25 LAB — FERRITIN: Ferritin: 222 ng/mL (ref 11–307)

## 2019-01-25 LAB — GLUCOSE, CAPILLARY: Glucose-Capillary: 210 mg/dL — ABNORMAL HIGH (ref 70–99)

## 2019-01-25 LAB — FIBRINOGEN: Fibrinogen: 676 mg/dL — ABNORMAL HIGH (ref 210–475)

## 2019-01-25 LAB — TROPONIN I (HIGH SENSITIVITY): Troponin I (High Sensitivity): 14 ng/L (ref <18)

## 2019-01-25 LAB — C-REACTIVE PROTEIN: CRP: 9.8 mg/dL — ABNORMAL HIGH (ref <1.0)

## 2019-01-25 LAB — LACTATE DEHYDROGENASE: LDH: 179 U/L (ref 98–192)

## 2019-01-25 LAB — D-DIMER, QUANTITATIVE: D-Dimer, Quant: 1.19 ug/mL-FEU — ABNORMAL HIGH (ref 0.00–0.50)

## 2019-01-25 LAB — PROCALCITONIN: Procalcitonin: <0.1 ng/mL

## 2019-01-25 MED ORDER — OXYCODONE-ACETAMINOPHEN 5-325 MG PO TABS
1.0000 | ORAL_TABLET | Freq: Four times a day (QID) | ORAL | Status: DC | PRN
  Administered 2019-01-26 – 2019-01-27 (×2): 1 via ORAL
  Filled 2019-01-25 (×2): qty 1

## 2019-01-25 MED ORDER — SODIUM CHLORIDE 0.9 % IV SOLN
500.0000 mg | Freq: Once | INTRAVENOUS | Status: AC
  Administered 2019-01-25: 500 mg via INTRAVENOUS
  Filled 2019-01-25: qty 500

## 2019-01-25 MED ORDER — INSULIN GLARGINE 100 UNIT/ML ~~LOC~~ SOLN
20.0000 [IU] | Freq: Every day | SUBCUTANEOUS | Status: DC
  Administered 2019-01-25: 20 [IU] via SUBCUTANEOUS
  Filled 2019-01-25 (×2): qty 0.2

## 2019-01-25 MED ORDER — RIVAROXABAN 10 MG PO TABS
10.0000 mg | ORAL_TABLET | Freq: Every day | ORAL | Status: DC
  Administered 2019-01-25 – 2019-01-30 (×6): 10 mg via ORAL
  Filled 2019-01-25 (×7): qty 1

## 2019-01-25 MED ORDER — MEMANTINE HCL 5 MG PO TABS
5.0000 mg | ORAL_TABLET | Freq: Every day | ORAL | Status: DC
  Administered 2019-01-25 – 2019-01-29 (×5): 5 mg via ORAL
  Filled 2019-01-25 (×6): qty 1

## 2019-01-25 MED ORDER — OXYCODONE HCL 5 MG PO TABS
5.0000 mg | ORAL_TABLET | Freq: Four times a day (QID) | ORAL | Status: DC | PRN
  Administered 2019-01-26 – 2019-01-28 (×3): 5 mg via ORAL
  Filled 2019-01-25 (×3): qty 1

## 2019-01-25 MED ORDER — DULOXETINE HCL 60 MG PO CPEP
60.0000 mg | ORAL_CAPSULE | Freq: Every day | ORAL | Status: DC
  Administered 2019-01-25 – 2019-01-30 (×6): 60 mg via ORAL
  Filled 2019-01-25 (×7): qty 1

## 2019-01-25 MED ORDER — ANASTROZOLE 1 MG PO TABS
1.0000 mg | ORAL_TABLET | Freq: Every day | ORAL | Status: DC
  Administered 2019-01-25 – 2019-01-30 (×6): 1 mg via ORAL
  Filled 2019-01-25 (×7): qty 1

## 2019-01-25 MED ORDER — MEMANTINE HCL 5 MG PO TABS
10.0000 mg | ORAL_TABLET | Freq: Every morning | ORAL | Status: DC
  Administered 2019-01-26 – 2019-01-30 (×5): 10 mg via ORAL
  Filled 2019-01-25 (×6): qty 2

## 2019-01-25 MED ORDER — INSULIN ASPART 100 UNIT/ML ~~LOC~~ SOLN: 14.0000 [IU] | SUBCUTANEOUS | Status: DC

## 2019-01-25 MED ORDER — MOMETASONE FURO-FORMOTEROL FUM 200-5 MCG/ACT IN AERO
2.0000 | INHALATION_SPRAY | Freq: Two times a day (BID) | RESPIRATORY_TRACT | Status: DC
  Administered 2019-01-25 – 2019-01-30 (×10): 2 via RESPIRATORY_TRACT
  Filled 2019-01-25: qty 8.8

## 2019-01-25 MED ORDER — CLONAZEPAM 0.5 MG PO TABS
0.5000 mg | ORAL_TABLET | Freq: Every evening | ORAL | Status: DC | PRN
  Administered 2019-01-29: 0.5 mg via ORAL
  Filled 2019-01-25: qty 1

## 2019-01-25 MED ORDER — MAGNESIUM OXIDE 400 MG PO TABS
400.0000 mg | ORAL_TABLET | Freq: Every day | ORAL | Status: DC
  Administered 2019-01-25 – 2019-01-30 (×6): 400 mg via ORAL
  Filled 2019-01-25 (×13): qty 1

## 2019-01-25 MED ORDER — FAMOTIDINE IN NACL 20-0.9 MG/50ML-% IV SOLN
20.0000 mg | Freq: Every day | INTRAVENOUS | Status: DC
  Administered 2019-01-25 – 2019-01-26 (×2): 20 mg via INTRAVENOUS
  Filled 2019-01-25 (×2): qty 50

## 2019-01-25 MED ORDER — SODIUM CHLORIDE 0.9 % IV SOLN: 2.0000 g | INTRAVENOUS | Status: DC

## 2019-01-25 MED ORDER — OXYCODONE-ACETAMINOPHEN 10-325 MG PO TABS: 1.0000 | ORAL_TABLET | Freq: Four times a day (QID) | ORAL | Status: DC | PRN

## 2019-01-25 MED ORDER — SODIUM CHLORIDE 0.9 % IV BOLUS
1000.0000 mL | Freq: Once | INTRAVENOUS | Status: AC
  Administered 2019-01-25: 1000 mL via INTRAVENOUS

## 2019-01-25 MED ORDER — GUAIFENESIN-DM 100-10 MG/5ML PO SYRP: 10.0000 mL | ORAL_SOLUTION | ORAL | Status: DC | PRN

## 2019-01-25 MED ORDER — MECLIZINE HCL 25 MG PO TABS
25.0000 mg | ORAL_TABLET | Freq: Three times a day (TID) | ORAL | Status: DC | PRN
  Filled 2019-01-25: qty 2

## 2019-01-25 MED ORDER — DEXAMETHASONE SODIUM PHOSPHATE 10 MG/ML IJ SOLN
6.0000 mg | INTRAMUSCULAR | Status: DC
  Administered 2019-01-25 – 2019-01-27 (×3): 6 mg via INTRAVENOUS
  Filled 2019-01-25 (×3): qty 1

## 2019-01-25 MED ORDER — NITROGLYCERIN 0.4 MG SL SUBL: 0.4000 mg | SUBLINGUAL_TABLET | SUBLINGUAL | Status: DC | PRN

## 2019-01-25 MED ORDER — ONDANSETRON HCL 4 MG PO TABS: 4.0000 mg | ORAL_TABLET | Freq: Four times a day (QID) | ORAL | Status: DC | PRN

## 2019-01-25 MED ORDER — ONDANSETRON HCL 4 MG/2ML IJ SOLN: 4.0000 mg | Freq: Four times a day (QID) | INTRAMUSCULAR | Status: DC | PRN

## 2019-01-25 MED ORDER — VITAMIN C 500 MG PO TABS
500.0000 mg | ORAL_TABLET | Freq: Every day | ORAL | Status: DC
  Administered 2019-01-25 – 2019-01-30 (×6): 500 mg via ORAL
  Filled 2019-01-25 (×6): qty 1

## 2019-01-25 MED ORDER — HYDROCOD POLST-CPM POLST ER 10-8 MG/5ML PO SUER: 5.0000 mL | Freq: Two times a day (BID) | ORAL | Status: DC | PRN

## 2019-01-25 MED ORDER — ISOSORBIDE MONONITRATE ER 30 MG PO TB24
60.0000 mg | ORAL_TABLET | Freq: Every day | ORAL | Status: DC
  Administered 2019-01-25 – 2019-01-30 (×6): 60 mg via ORAL
  Filled 2019-01-25 (×6): qty 2

## 2019-01-25 MED ORDER — SODIUM CHLORIDE 0.9 % IV SOLN
2.0000 g | Freq: Once | INTRAVENOUS | Status: AC
  Administered 2019-01-25: 09:00:00 2 g via INTRAVENOUS
  Filled 2019-01-25: qty 20

## 2019-01-25 MED ORDER — METOPROLOL SUCCINATE ER 25 MG PO TB24
25.0000 mg | ORAL_TABLET | Freq: Every day | ORAL | Status: DC
  Administered 2019-01-25 – 2019-01-30 (×6): 25 mg via ORAL
  Filled 2019-01-25 (×6): qty 1

## 2019-01-25 MED ORDER — FEBUXOSTAT 40 MG PO TABS
40.0000 mg | ORAL_TABLET | Freq: Every day | ORAL | Status: DC
  Administered 2019-01-25 – 2019-01-30 (×6): 40 mg via ORAL
  Filled 2019-01-25 (×7): qty 1

## 2019-01-25 MED ORDER — HYDRALAZINE HCL 50 MG PO TABS
50.0000 mg | ORAL_TABLET | Freq: Two times a day (BID) | ORAL | Status: DC
  Administered 2019-01-25 – 2019-01-30 (×10): 50 mg via ORAL
  Filled 2019-01-25 (×10): qty 1

## 2019-01-25 MED ORDER — PRAVASTATIN SODIUM 10 MG PO TABS
20.0000 mg | ORAL_TABLET | Freq: Every day | ORAL | Status: DC
  Administered 2019-01-25 – 2019-01-30 (×6): 20 mg via ORAL
  Filled 2019-01-25 (×6): qty 2

## 2019-01-25 MED ORDER — IPRATROPIUM-ALBUTEROL 20-100 MCG/ACT IN AERS
1.0000 | INHALATION_SPRAY | Freq: Four times a day (QID) | RESPIRATORY_TRACT | Status: DC
  Administered 2019-01-25 – 2019-01-30 (×18): 1 via RESPIRATORY_TRACT
  Filled 2019-01-25: qty 4

## 2019-01-25 MED ORDER — ZINC SULFATE 220 (50 ZN) MG PO CAPS
220.0000 mg | ORAL_CAPSULE | Freq: Every day | ORAL | Status: DC
  Administered 2019-01-25 – 2019-01-30 (×6): 220 mg via ORAL
  Filled 2019-01-25 (×6): qty 1

## 2019-01-25 MED ORDER — SODIUM CHLORIDE 0.9 % IV SOLN: 500.0000 mg | INTRAVENOUS | Status: DC

## 2019-01-25 NOTE — ED Notes (Signed)
[facility] here to transport patient to [facility]

## 2019-01-25 NOTE — Progress Notes (Signed)
Report received from Claiborne Billings, [place] at [facility] ED.

## 2019-01-25 NOTE — Plan of Care (Signed)
  Problem: Health Behavior/Discharge Planning: Goal: Ability to manage health-related needs will improve Outcome: Progressing   Problem: Clinical Measurements: Goal: Ability to maintain clinical measurements within normal limits will improve Outcome: Progressing Goal: Will remain free from infection Outcome: Progressing Goal: Respiratory complications will improve Outcome: Progressing   Problem: Activity: Goal: Risk for activity intolerance will decrease Outcome: Progressing   Problem: Coping: Goal: Level of anxiety will decrease Outcome: Progressing   Problem: Safety: Goal: Ability to remain free from injury will improve Outcome: Progressing   Problem: Skin Integrity: Goal: Risk for impaired skin integrity will decrease Outcome: Progressing   Problem: Education: Goal: Knowledge of risk factors and measures for prevention of condition will improve Outcome: Progressing   Problem: Respiratory: Goal: Will maintain a patent airway Outcome: Progressing Goal: Complications related to the disease process, condition or treatment will be avoided or minimized Outcome: Progressing

## 2019-01-25 NOTE — H&P (Addendum)
History and Physical    [name] ZYS:[id number] DOB: April 04, 1935 DOA: 01/24/2019  Referring MD/NP/PA: Gerlene Fee, MD PCP: [name], NP  Patient coming from: home  Chief Complaint: Chest pain and shortness of breath  I have personally briefly reviewed patient's old medical records in [place]   HPI: [name] is a 84 y.o. female with medical history significant of hypertension, hyperlipidemia, CHF, insulin-dependent diabetes mellitus, CAD, depression, anxiety, chronic pain syndrome, GERD, and morbid obesity; who presents with complaints of chest pain and shortness of breath for the last 3-4 days.  At baseline patient is wheelchair-bound, but usually able to transition on her own.  She reports going down to [place] to visit family approximately 2 weeks ago where someone had found to be positive for COVID-19.  Reports having mid substernal chest pain and shortness of breath with any kind of exertion. Patient  has had a productive cough with green to yellowish sputum production.  Other associated symptoms include myalgias, joint pain from her head down to her toe, subjective fevers, nausea, weakness, diarrhea, and decreased p.o. intake due to change in taste.  Due to her symptoms she is been unable to get up and transition back to the bedside commode without assistance.  He had at least 2-3 episodes of diarrhea yesterday and that is unusual for her as she is normally constipated due to her taking pain medications.  Denies having any significant falls, loss of consciousness, leg swelling, rash, abdominal pain, or dysuria symptoms.  ED Course: Upon admission into the emergency department patient was noted to be febrile up to 100 F, pulse 95-1 02, respirations 18-22, blood pressures 127/81 ~161/78, and O2 saturations maintained on room air.  Labs significant for WBC 6, hemoglobin 9.8, sodium 132, BUN 33, creatinine 1.71, glucose 257, and lactic acid 1.5.  COVID-19 screening  was positive.  Inflammatory markers had not initially been obtained.  Chest x-ray revealed bilateral hazy opacities concerning for atelectasis versus infection.  Blood cultures were obtained.  Patient was given 1 L normal saline IV fluids,    Azithromycin, and Rocephin.  TRH called to admit.  Review of Systems  Constitutional: Positive for chills, fever and malaise/fatigue.  HENT: Negative for congestion.   Eyes: Negative for pain.  Respiratory: Positive for cough and sputum production.   Cardiovascular: Positive for chest pain. Negative for leg swelling.  Gastrointestinal: Positive for constipation, diarrhea and nausea. Negative for blood in stool, heartburn and vomiting.  Genitourinary: Negative for dysuria and frequency.  Musculoskeletal: Positive for joint pain and myalgias.  Skin: Negative for itching.  Neurological: Negative for headaches.  Psychiatric/Behavioral: Positive for memory loss. Negative for substance abuse.    Past Medical History:  Diagnosis Date  . Allergy    takes Mucinex daily as needed  . Anemia, unspecified   . Anxiety    takes Clonazepam daily as needed  . Arthritis   . Cancer (HCC)    breast  . CHF (congestive heart failure) ([place])    takes Furosemide daily  . CKD (chronic kidney disease)   . Coronary artery disease    a. s/p IMI 2004 tx with BMS to RCA;  b. s/p Promus DES to RCA 2/12 c. 06/2014 High risk stress test follow up cath 06/2014 showd patent stent--< med therapy for other mild to moedrate disease   . Coronary atherosclerosis of native coronary artery   . Depression    takes Cymbalta daily  . Diabetes mellitus  insulin daily  . Dysuria   . GERD (gastroesophageal reflux disease)    takes Protonix daily  . Gout, unspecified    takes Colchicine daily  . Headache    occasionally  . History of blood clots about 40yrs ago   in legs-takes Coumadin   . Hyperlipidemia    takes Pravastatin daily  . Hypertension    takes Imdur and Metoprolol  daily  . Insomnia   . Joint pain   . Joint swelling   . Myocardial infarction ([place]) 2004  . Numbness   . Obstructive sleep apnea    does not wear cpap  . Osteoarthritis   . Osteoarthritis   . Osteoarthrosis, unspecified whether generalized or localized, lower leg   . Pain, chronic   . Polymyalgia rheumatica ([place])   . Pulmonary emboli ([place]) 9/13   felt to need lifelong anticoagulation  . Shortness of breath dyspnea    with exertion and has Albuterol inhaler prn  . Type II or unspecified type diabetes mellitus without mention of complication, uncontrolled   . Urinary incontinence    takes Linzess daily  . Vertigo    hx of;was taking Meclizine if needed  . Wears dentures     Past Surgical History:  Procedure Laterality Date  . ABDOMINAL HYSTERECTOMY     partial  . APPENDECTOMY    . blood clots/legs and lungs  2013  . BREAST BIOPSY Left 07/22/2014  . BREAST BIOPSY Left 02/10/2013  . BREAST LUMPECTOMY Left 11/05/2014  . BREAST LUMPECTOMY WITH RADIOACTIVE SEED LOCALIZATION Left 11/05/2014   Procedure: LEFT BREAST LUMPECTOMY WITH RADIOACTIVE SEED LOCALIZATION;  Surgeon: [name], MD;  Location: [facility];  Service: General;  Laterality: Left;  . CARDIAC CATHETERIZATION    . COLONOSCOPY    . CORONARY ANGIOPLASTY  2  . ESOPHAGOGASTRODUODENOSCOPY (EGD) WITH PROPOFOL N/A 11/07/2016   Procedure: ESOPHAGOGASTRODUODENOSCOPY (EGD) WITH PROPOFOL;  Surgeon: Gatha Mayer, MD;  Location: [facility];  Service: Endoscopy;  Laterality: N/A;  . EXCISION OF SKIN TAG Right 11/05/2014   Procedure: EXCISION OF RIGHT EYELID SKIN TAG;  Surgeon: [name], MD;  Location: [facility];  Service: General;  Laterality: Right;  . EYE SURGERY Bilateral    cataract   . GASTRIC BYPASS  1977    reversed in 1979, [place] N/A 06/29/2014   Procedure: LEFT HEART CATHETERIZATION WITH CORONARY ANGIOGRAM;  Surgeon: Troy Sine, MD;  Location: [facility]  CATH LAB;  Service: Cardiovascular;  Laterality: N/A;  . MEMBRANE PEEL Right 10/23/2018   Procedure: MEMBRANE PEEL;  Surgeon: Hurman Horn, MD;  Location: [facility];  Service: Ophthalmology;  Laterality: Right;  . MI with stent placement  2004  . PARS PLANA VITRECTOMY Right 10/23/2018   Procedure: PARS PLANA VITRECTOMY WITH 25 GAUGE;  Surgeon: Hurman Horn, MD;  Location: [facility];  Service: Ophthalmology;  Laterality: Right;     reports that she has never smoked. She has never used smokeless tobacco. She reports that she does not drink alcohol or use drugs.  Allergies  Allergen Reactions  . Sulfonamide Derivatives Swelling    Mouth swelling  . Aricept [Donepezil Hcl] Diarrhea and Nausea And Vomiting    GI upset/loose stools  . Tramadol Nausea And Vomiting    Family History  Problem Relation Age of Onset  . Breast cancer Mother 16  . Heart disease Mother   . Throat cancer Father   . Hypertension Father   .  Arthritis Father   . Diabetes Father   . Arthritis Sister   . Obesity Sister   . Diabetes Sister   . Heart disease Cousin   . Colon cancer Neg Hx   . Stomach cancer Neg Hx   . Esophageal cancer Neg Hx     Prior to Admission medications   Medication Sig Start Date End Date Taking? Authorizing Provider  albuterol (PROAIR HFA) 108 (90 Base) MCG/ACT inhaler Inhale 1-2 puffs into the lungs every 6 (six) hours as needed for wheezing or shortness of breath. 12/19/18   [name], NP  allopurinol (ZYLOPRIM) 300 MG tablet Take 1 tablet by mouth daily.    [provider]  anastrozole (ARIMIDEX) 1 MG tablet TAKE 1 TABLET BY MOUTH EVERY DAY 09/17/18   [name], MD  budesonide-formoterol [facility]) 160-4.5 MCG/ACT inhaler Inhale 2 puffs into the lungs 2 (two) times daily. For bronchitis 09/03/18   [name], NP  clonazePAM [facility] [place]) 1 MG tablet Take two tablets by mouth at bedtime as needed for anxiety 09/19/18   [name], NP  Continuous Blood  Gluc Receiver (FREESTYLE LIBRE 14 DAY READER) DEVI 1 Device by Does not apply route 4 (four) times daily -  before meals and at bedtime. For diabetes 12/11/17   Gildardo Cranker, DO  Continuous Blood Gluc Sensor (FREESTYLE LIBRE 14 DAY SENSOR) MISC 1 Units by Does not apply route 4 (four) times daily -  before meals and at bedtime. For diabetes 12/10/18   [name], NP  DULoxetine (CYMBALTA) 60 MG capsule Take one capsule by mouth once daily for anxiety 09/17/18   [name], NP  febuxostat (ULORIC) 40 MG tablet Take 1 tablet (40 mg total) by mouth daily. Patient not taking: Reported on 12/20/2018 12/03/18   [name], NP  furosemide (LASIX) 20 MG tablet Take 1 tablet (20 mg total) by mouth daily as needed. 07/29/18   [name], NP  hydrALAZINE (APRESOLINE) 50 MG tablet TAKE 1 TABLET BY MOUTH 2 TIMES DAILY 07/29/18   [name], NP  insulin aspart (NOVOLOG FLEXPEN) 100 UNIT/ML FlexPen Inject 12 Units into the skin 3 (three) times daily with meals. Max 45 units 11/01/18   Shamleffer, [name], MD  Insulin Glargine, 2 Unit Dial, (TOUJEO MAX SOLOSTAR) 300 UNIT/ML SOPN Inject 40 Units into the skin daily. Patient assistance program provides 08/21/18   Shamleffer, [name], MD  isosorbide mononitrate (IMDUR) 60 MG 24 hr tablet Take one tablet by mouth once daily 09/17/18   [name], NP  magnesium oxide (MAG-OX) 400 MG tablet Take 400 mg by mouth daily.    [provider]  meclizine (ANTIVERT) 25 MG tablet Take one tablet by mouth three times daily as needed for diziness 09/25/18   [name], NP  memantine (NAMENDA) 5 MG tablet TAKE 2 TABLETS BY MOUTH EVERY MORNING and TAKE 1 TABLET BY MOUTH EVERY EVENING FOR MEMORY 11/07/18   [name], NP  metoprolol succinate (TOPROL-XL) 25 MG 24 hr tablet Take one tablet by mouth once daily for blood pressure 09/17/18   [name], NP  nitroGLYCERIN (NITROSTAT) 0.4 MG SL tablet Take one  tablet under the tongue every 5 minutes as needed for chest pain 09/24/13   [name], NP  oxyCODONE-acetaminophen (PERCOCET) 10-325 MG tablet Take 1 tablet by mouth every 6 (six) hours. 01/20/19   [name], NP  pantoprazole (PROTONIX) 40 MG tablet  Take 1 tablet (40 mg total) by mouth daily. 01/17/19   [name], NP  pravastatin (PRAVACHOL) 20 MG tablet Take one tablet by mouth once daily for cholesterol 09/17/18   [name], NP  senna-docusate (SENOKOT-S) 8.6-50 MG tablet Take 1 tablet by mouth at bedtime. Patient not taking: Reported on 12/20/2018 12/18/18   Ngetich, Dinah C, NP  sertraline (ZOLOFT) 25 MG tablet Take 1 tablet (25 mg total) by mouth daily. 12/18/18 01/17/19  Ngetich, Dinah C, NP  sodium polystyrene (KAYEXALATE) 15 GM/60ML suspension 15 gm by mouth weekly to keep potassium down. 10/11/18   [name], NP  XARELTO 10 MG TABS tablet TAKE 1 TABLET BY MOUTH EVERY DAY 10/22/18   [name], NP    Physical Exam:  Constitutional: Morbidly obese female who appears to be ill but nontoxic in appearance Vitals:   01/25/19 0720 01/25/19 0826 01/25/19 0958 01/25/19 1000  BP: 127/81 (!) 156/76 (!) 161/78 (!) 147/79  Pulse: 96 (!) 102 95 97  Resp: 18 20 (!) 21 (!) 22  Temp: 99.1 F (37.3 C) 99.2 F (37.3 C)    TempSrc: Oral Oral    SpO2: 95% 96% 100% 100%  Weight:      Height:       Eyes: PERRL, lids and conjunctivae normal ENMT: Mucous membranes are dry. Posterior pharynx clear of any exudate or lesions.   Neck: normal, supple, no masses, no thyromegaly Respiratory: Normal respiratory effort currently on room air.  No significant wheezes or rails, but decreased overall aeration noted. Cardiovascular: Regular rate and rhythm, no murmurs / rubs / gallops. No extremity edema. 2+ pedal pulses. No carotid bruits.  Abdomen: no tenderness, no masses palpated. No hepatosplenomegaly. Bowel sounds positive.  Musculoskeletal: no clubbing / cyanosis.  No joint deformity upper and lower extremities. Good ROM, no contractures. Normal muscle tone.  Skin: no rashes, lesions, ulcers. No induration Neurologic: CN 2-12 grossly intact. Sensation intact, DTR normal. Strength 5/5 in all 4.  Psychiatric: Memory mildly decreased. Alert and oriented x 3. Normal mood.     Labs on Admission: I have personally reviewed following labs and imaging studies  CBC: Recent Labs  Lab 01/24/19 2042  WBC 6.0  HGB 9.8*  HCT 31.5*  MCV 92.4  PLT 532   Basic Metabolic Panel: Recent Labs  Lab 01/24/19 2042  NA 132*  K 4.5  CL 98  CO2 23  GLUCOSE 257*  BUN 33*  CREATININE 1.71*  CALCIUM 8.3*   GFR: Estimated Creatinine Clearance: 39.2 mL/min (A) (by C-G formula based on SCr of 1.71 mg/dL (H)). Liver Function Tests: No results for input(s): AST, ALT, ALKPHOS, BILITOT, PROT, ALBUMIN in the last 168 hours. No results for input(s): LIPASE, AMYLASE in the last 168 hours. No results for input(s): AMMONIA in the last 168 hours. Coagulation Profile: No results for input(s): INR, PROTIME in the last 168 hours. Cardiac Enzymes: No results for input(s): CKTOTAL, CKMB, CKMBINDEX, TROPONINI in the last 168 hours. BNP (last 3 results) No results for input(s): PROBNP in the last 8760 hours. HbA1C: No results for input(s): HGBA1C in the last 72 hours. CBG: Recent Labs  Lab 01/25/19 0823  GLUCAP 223*   Lipid Profile: No results for input(s): CHOL, HDL, LDLCALC, TRIG, CHOLHDL, LDLDIRECT in the last 72 hours. Thyroid Function Tests: No results for input(s): TSH, T4TOTAL, FREET4, T3FREE, THYROIDAB in the last 72 hours. Anemia Panel: No results for input(s): VITAMINB12, FOLATE, FERRITIN, TIBC, IRON, RETICCTPCT in the  last 72 hours. Urine analysis:    Component Value Date/Time   COLORURINE YELLOW 08/27/2018 1730   APPEARANCEUR HAZY (A) 08/27/2018 1730   LABSPEC 1.010 08/27/2018 1730   PHURINE 5.0 08/27/2018 1730   GLUCOSEU NEGATIVE 08/27/2018 1730    HGBUR NEGATIVE 08/27/2018 1730   BILIRUBINUR NEGATIVE 08/27/2018 1730   BILIRUBINUR Neg 06/29/2017 0908   KETONESUR NEGATIVE 08/27/2018 1730   PROTEINUR NEGATIVE 08/27/2018 1730   UROBILINOGEN 0.2 02/16/2018 1514   NITRITE NEGATIVE 08/27/2018 1730   LEUKOCYTESUR TRACE (A) 08/27/2018 1730   Sepsis Labs: Recent Results (from the past 240 hour(s))  SARS Coronavirus 2 [facility] order, Performed in [facility] hospital lab) Nasopharyngeal Nasopharyngeal Swab     Status: Abnormal   Collection Time: 01/25/19  8:50 AM   Specimen: Nasopharyngeal Swab  Result Value Ref Range Status   SARS Coronavirus 2 POSITIVE (A) NEGATIVE Final    Comment: RESULT CALLED TO, READ BACK BY AND VERIFIED WITH: H. MOON RN, AT 8850 01/25/19 BY D. VANHOOK (NOTE) If result is NEGATIVE SARS-CoV-2 target nucleic acids are NOT DETECTED. The SARS-CoV-2 RNA is generally detectable in upper and lower  respiratory specimens during the acute phase of infection. The lowest  concentration of SARS-CoV-2 viral copies this assay can detect is 250  copies / mL. A negative result does not preclude SARS-CoV-2 infection  and should not be used as the sole basis for treatment or other  patient management decisions.  A negative result may occur with  improper specimen collection / handling, submission of specimen other  than nasopharyngeal swab, presence of viral mutation(s) within the  areas targeted by this assay, and inadequate number of viral copies  (<250 copies / mL). A negative result must be combined with clinical  observations, patient history, and epidemiological information. If result is POSITIVE SARS-CoV-2 target nucleic acids are DETECT ED. The SARS-CoV-2 RNA is generally detectable in upper and lower  respiratory specimens during the acute phase of infection.  Positive  results are indicative of active infection with SARS-CoV-2.  Clinical  correlation with patient history and other diagnostic information is   necessary to determine patient infection status.  Positive results do  not rule out bacterial infection or co-infection with other viruses. If result is PRESUMPTIVE POSTIVE SARS-CoV-2 nucleic acids MAY BE PRESENT.   A presumptive positive result was obtained on the submitted specimen  and confirmed on repeat testing.  While 2019 novel coronavirus  (SARS-CoV-2) nucleic acids may be present in the submitted sample  additional confirmatory testing may be necessary for epidemiological  and / or clinical management purposes  to differentiate between  SARS-CoV-2 and other Sarbecovirus currently known to infect humans.  If clinically indicated additional testing with an alternate test  methodology (LAB74 53) is advised. The SARS-CoV-2 RNA is generally  detectable in upper and lower respiratory specimens during the acute  phase of infection. The expected result is Negative. Fact Sheet for Patients:  StrictlyIdeas.no Fact Sheet for Healthcare Providers: BankingDealers.co.za This test is not yet approved or cleared by the [place] FDA and has been authorized for detection and/or diagnosis of SARS-CoV-2 by FDA under an Emergency Use Authorization (EUA).  This EUA will remain in effect (meaning this test can be used) for the duration of the COVID-19 declaration under Section 564(b)(1) of the Act, 21 U.S.C. section 360bbb-3(b)(1), unless the authorization is terminated or revoked sooner. Performed at [facility], [place] [address], [place], [place] [address]      Radiological  Exams on Admission: Dg Chest 1 View  Result Date: 01/24/2019 CLINICAL DATA:  Shortness of breath EXAM: CHEST  1 VIEW COMPARISON:  August 27, 2018 FINDINGS: The heart size and mediastinal contours are within normal limits. There is mildly increased hazy airspace opacity seen at the bilateral lower lungs. Overall shallow degree of aeration. The visualized skeletal  structures are unremarkable. IMPRESSION: Mildly increased hazy airspace opacity at both lower lungs which is non-specific could be due to atelectasis and/or early infectious etiology. Electronically Signed   By: Prudencio Pair M.D.   On: 01/24/2019 21:19    EKG: Independently reviewed.  Normal sinus rhythm at 98 bpm with left axis deviation.  Assessment/Plan Pneumonia due to COVID-19: Patient presents with progressively worsening productive cough and complaints of chest pain.  Chest x-ray showing mild increase airspace opacities concerning for atelectasis versus infectious process.  Patient was started on empiric antibiotics of Rocephin and azithromycin -Admit to a MedSurg bed -COVID-19 admission order set utilized -Continuous pulse oximetry with nasal cannula oxygen as needed maintain O2 saturation greater than 90% -Follow-up blood and sputum cultures -Check inflammatory markers stat -Continue Rocephin and azithromycin for now -Combivent inhaler every 6 hours -Vitamin C and zinc -Decongestants as needed -Monitor for need to start Remdesivir and Decadron  Chest pain: EKG without significant ischemic changes and high-sensitivity troponin negative x2.  Suspect less likely cardiac in nature. -Continue isosorbide mononitrate and nitroglycerin tablets as needed -Follow-up telemetry  CAD s/p PCI: Patient with prior history of MI in 2004 requiring bare stent placement to the RCA and then again in 06/2010 stenting unstable angina. -Continue current medical management  Essential hypertension: On admission blood pressures 127/81 elevated up to 161/78. -Continue hydralazine, metoprolol, and isosorbide mononitrate  Chronic kidney disease stage III: Patient presents with creatinine 1.71 with BUN 33.  Baseline creatinine appears to range from 1.5-1.8.  Patient was initially given 1 L normal saline IV fluids. -Recheck creatinine in a.m.  Diastolic congestive heart failure: Patient does not appear to be  fluid overloaded.  Not on ACE/RB due to kidney function.  At home patient on furosemide as needed. -Strict intake and output   -Daily weights -Furosemide initially held due to major  COPD with history of chronic bronchitis: Patient without any signs of wheezing on physical exam, but does report increased productive cough. -Continue pharmacy substitution for Symbicort -Other treatments as seen above  Anemia of chronic disease: Stable.  Hemoglobin 9.8 g/dL on admission. Baseline hemoglobin appears to range from 9-10. -Continue to monitor Chronic pain: Patient reports complaining of pain from her head down to her toes. -Continue current home regimen of oxycodone every 6 hours as needed for pain  Hyponatremia: Sodium 132 on admission.  Given recent decreased p.o. intake suspect hypovolemic hyponatremia. -Recheck sodium in a.m.  Insulin-dependent diabetes mellitus: Uncontrolled.  On admission blood sugar elevated up to 257.  Last hemoglobin A1c noted to be 11.7 on 12/20/2018.  Patient home regimen includes NovoLog 14 units 3 times daily with meals with additional sliding scale for blood sugars over 180, and glargine 40 units daily. -Hypoglycemic protocols -Continue NovoLog 14 units 3 times with meals and sliding scale  -Decreased Lantus to 20 units daily -CBGs q. before meals and at bedtime -Adjust insulin regimen as needed.  Anxiety and depression: stable. -Continue Cymbalta and Klonopin as needed  Mild dementia -Continue Namenda  Hyperlipidemia -Continue pravastatin  History of DVT/PE on chronic anticoagulation -Continue Xarelto  Morbid obesity: BMI 52.47 kg/m.  Patient wheelchair-bound  due to her weight. -Continue to encourage as needed weight loss  DVT prophylaxis: Xarelto Code Status: DNR  Family Communication: Family updated over the phone Disposition Plan: TBD Consults called: none Admission status: Inpatient  Norval Morton MD [facility] Pager [phone]    If 7PM-7AM, please contact night-coverage [email or web address] Password [facility]  01/25/2019, 10:53 AM

## 2019-01-25 NOTE — ED Notes (Signed)
Daughter took patient;s wheelchair  Home with her

## 2019-01-25 NOTE — ED Notes (Signed)
Daughter left  [name] # [id number] Katha Cabal [phone]

## 2019-01-25 NOTE — ED Notes (Signed)
ED TO INPATIENT HANDOFF REPORT  ED Nurse Name and Phone #: [phone] Ophelia Charter RN   S Name/Age/Gender [name] 84 y.o. female Room/Bed: 026C/026C  Code Status   Code Status: Prior  Home/SNF/Other Home Patient oriented to: self, place, time and situation Is this baseline? Yes   Triage Complete: Triage complete  Chief Complaint chest pain  Triage Note Pt to ED with c/o mid upper chest pain and shortness of breath.  Onset earlier today.  Describes pain as a pressure.  No nausea or vomiting but has had some diaphoresis   Allergies Allergies  Allergen Reactions  . Sulfonamide Derivatives Swelling    Mouth swelling  . Aricept [Donepezil Hcl] Diarrhea and Nausea And Vomiting    GI upset/loose stools  . Tramadol Nausea And Vomiting    Level of Care/Admitting Diagnosis ED Disposition    ED Disposition Condition [facility]: [place] [[id number]]  Level of Care: Telemetry [5]  Covid Evaluation: Confirmed COVID Positive  Diagnosis: Pneumonia due to COVID-19 virus [[id number]]  Admitting Physician: Norval Morton [[id number]]  Attending Physician: Norval Morton [[id number]]  Estimated length of stay: past midnight tomorrow  Certification:: I certify this patient will need inpatient services for at least 2 midnights  PT Class (Do Not Modify): Inpatient [101]  PT Acc Code (Do Not Modify): Private [1]       B Medical/Surgery History Past Medical History:  Diagnosis Date  . Allergy    takes Mucinex daily as needed  . Anemia, unspecified   . Anxiety    takes Clonazepam daily as needed  . Arthritis   . Cancer (HCC)    breast  . CHF (congestive heart failure) ([place])    takes Furosemide daily  . CKD (chronic kidney disease)   . Coronary artery disease    a. s/p IMI 2004 tx with BMS to RCA;  b. s/p Promus DES to RCA 2/12 c. 06/2014 High risk stress test follow up cath 06/2014 showd patent stent--< med therapy for other mild to  moedrate disease   . Coronary atherosclerosis of native coronary artery   . Depression    takes Cymbalta daily  . Diabetes mellitus    insulin daily  . Dysuria   . GERD (gastroesophageal reflux disease)    takes Protonix daily  . Gout, unspecified    takes Colchicine daily  . Headache    occasionally  . History of blood clots about 29yrs ago   in legs-takes Coumadin   . Hyperlipidemia    takes Pravastatin daily  . Hypertension    takes Imdur and Metoprolol daily  . Insomnia   . Joint pain   . Joint swelling   . Myocardial infarction ([place]) 2004  . Numbness   . Obstructive sleep apnea    does not wear cpap  . Osteoarthritis   . Osteoarthritis   . Osteoarthrosis, unspecified whether generalized or localized, lower leg   . Pain, chronic   . Polymyalgia rheumatica ([place])   . Pulmonary emboli ([place]) 9/13   felt to need lifelong anticoagulation  . Shortness of breath dyspnea    with exertion and has Albuterol inhaler prn  . Type II or unspecified type diabetes mellitus without mention of complication, uncontrolled   . Urinary incontinence    takes Linzess daily  . Vertigo    hx of;was taking Meclizine if needed  . Wears dentures    Past Surgical History:  Procedure  Laterality Date  . ABDOMINAL HYSTERECTOMY     partial  . APPENDECTOMY    . blood clots/legs and lungs  2013  . BREAST BIOPSY Left 07/22/2014  . BREAST BIOPSY Left 02/10/2013  . BREAST LUMPECTOMY Left 11/05/2014  . BREAST LUMPECTOMY WITH RADIOACTIVE SEED LOCALIZATION Left 11/05/2014   Procedure: LEFT BREAST LUMPECTOMY WITH RADIOACTIVE SEED LOCALIZATION;  Surgeon: [name], MD;  Location: [facility];  Service: General;  Laterality: Left;  . CARDIAC CATHETERIZATION    . COLONOSCOPY    . CORONARY ANGIOPLASTY  2  . ESOPHAGOGASTRODUODENOSCOPY (EGD) WITH PROPOFOL N/A 11/07/2016   Procedure: ESOPHAGOGASTRODUODENOSCOPY (EGD) WITH PROPOFOL;  Surgeon: Gatha Mayer, MD;  Location: [facility];  Service: Endoscopy;   Laterality: N/A;  . EXCISION OF SKIN TAG Right 11/05/2014   Procedure: EXCISION OF RIGHT EYELID SKIN TAG;  Surgeon: [name], MD;  Location: [facility];  Service: General;  Laterality: Right;  . EYE SURGERY Bilateral    cataract   . GASTRIC BYPASS  1977    reversed in 1979, [place] N/A 06/29/2014   Procedure: LEFT HEART CATHETERIZATION WITH CORONARY ANGIOGRAM;  Surgeon: Troy Sine, MD;  Location: [facility];  Service: Cardiovascular;  Laterality: N/A;  . MEMBRANE PEEL Right 10/23/2018   Procedure: MEMBRANE PEEL;  Surgeon: Hurman Horn, MD;  Location: [facility];  Service: Ophthalmology;  Laterality: Right;  . MI with stent placement  2004  . PARS PLANA VITRECTOMY Right 10/23/2018   Procedure: PARS PLANA VITRECTOMY WITH 25 GAUGE;  Surgeon: Hurman Horn, MD;  Location: [facility];  Service: Ophthalmology;  Laterality: Right;     A IV Location/Drains/Wounds Patient Lines/Drains/Airways Status   Active Line/Drains/Airways    Name:   Placement date:   Placement time:   Site:   Days:   Peripheral IV 07/03/18 Left Antecubital   07/03/18    1015    Antecubital   206   Peripheral IV 10/23/18 Left;Posterior Forearm   10/23/18    1051    Forearm   94   Peripheral IV 01/25/19 Right Hand   01/25/19    0836    Hand   less than 1   External Urinary Catheter   10/08/17    1912    -   474   External Urinary Catheter   01/25/19    0839    -   less than 1   Airway 7 mm   10/23/18    1352     94   Incision (Closed) 11/05/14 Breast Left   11/05/14    0812     1542   Incision (Closed) 11/05/14 Eye Right   11/05/14    0812     1542   Incision (Closed) 10/23/18 Eye Right   10/23/18    1337     94          Intake/Output Last 24 hours No intake or output data in the 24 hours ending 01/25/19 1134  Labs/Imaging Results for orders placed or performed during the hospital encounter of 01/24/19 (from the past 48 hour(s))  Basic metabolic panel      Status: Abnormal   Collection Time: 01/24/19  8:42 PM  Result Value Ref Range   Sodium 132 (L) 135 - 145 mmol/L   Potassium 4.5 3.5 - 5.1 mmol/L   Chloride 98 98 - 111 mmol/L   CO2 23 22 - 32 mmol/L   Glucose,  Bld 257 (H) 70 - 99 mg/dL   BUN 33 (H) 8 - 23 mg/dL   Creatinine, Ser 1.71 (H) 0.44 - 1.00 mg/dL   Calcium 8.3 (L) 8.9 - 10.3 mg/dL   GFR calc non Af Amer 27 (L) >60 mL/min   GFR calc Af Amer 32 (L) >60 mL/min   Anion gap 11 5 - 15    Comment: Performed at [facility] [address], [place], [place] [address]  CBC     Status: Abnormal   Collection Time: 01/24/19  8:42 PM  Result Value Ref Range   WBC 6.0 4.0 - 10.5 K/uL   RBC 3.41 (L) 3.87 - 5.11 MIL/uL   Hemoglobin 9.8 (L) 12.0 - 15.0 g/dL   HCT 31.5 (L) 36.0 - 46.0 %   MCV 92.4 80.0 - 100.0 fL   MCH 28.7 26.0 - 34.0 pg   MCHC 31.1 30.0 - 36.0 g/dL   RDW 14.0 11.5 - 15.5 %   Platelets 212 150 - 400 K/uL   nRBC 0.0 0.0 - 0.2 %    Comment: Performed at [facility], [place] [address], [place], [place] [address]  Troponin I (High Sensitivity)     Status: None   Collection Time: 01/24/19  8:42 PM  Result Value Ref Range   Troponin I (High Sensitivity) 14 <18 ng/L    Comment: (NOTE) Elevated high sensitivity troponin I (hsTnI) values and significant  changes across serial measurements may suggest ACS but many other  chronic and acute conditions are known to elevate hsTnI results.  Refer to the "Links" section for chest pain algorithms and additional  guidance. Performed at [facility], [place] [address] [name], [place] [address]   Troponin I (High Sensitivity)     Status: None   Collection Time: 01/24/19 11:30 PM  Result Value Ref Range   Troponin I (High Sensitivity) 14 <18 ng/L    Comment: (NOTE) Elevated high sensitivity troponin I (hsTnI) values and significant  changes across serial measurements may suggest ACS but many other  chronic and acute conditions are known to elevate hsTnI results.   Refer to the "Links" section for chest pain algorithms and additional  guidance. Performed at [facility], [place] [address], [place], [place] [address]   CBG monitoring, ED     Status: Abnormal   Collection Time: 01/25/19  8:23 AM  Result Value Ref Range   Glucose-Capillary 223 (H) 70 - 99 mg/dL   Comment 1 Notify RN    Comment 2 Document in Chart   SARS Coronavirus 2 [facility] order, Performed in [facility] hospital lab) Nasopharyngeal Nasopharyngeal Swab     Status: Abnormal   Collection Time: 01/25/19  8:50 AM   Specimen: Nasopharyngeal Swab  Result Value Ref Range   SARS Coronavirus 2 POSITIVE (A) NEGATIVE    Comment: RESULT CALLED TO, READ BACK BY AND VERIFIED WITH: H. Melbourne Jakubiak RN, AT 2671 01/25/19 BY D. VANHOOK (NOTE) If result is NEGATIVE SARS-CoV-2 target nucleic acids are NOT DETECTED. The SARS-CoV-2 RNA is generally detectable in upper and lower  respiratory specimens during the acute phase of infection. The lowest  concentration of SARS-CoV-2 viral copies this assay can detect is 250  copies / mL. A negative result does not preclude SARS-CoV-2 infection  and should not be used as the sole basis for treatment or other  patient management decisions.  A negative result may occur with  improper specimen collection / handling,  submission of specimen other  than nasopharyngeal swab, presence of viral mutation(s) within the  areas targeted by this assay, and inadequate number of viral copies  (<250 copies / mL). A negative result must be combined with clinical  observations, patient history, and epidemiological information. If result is POSITIVE SARS-CoV-2 target nucleic acids are DETECT ED. The SARS-CoV-2 RNA is generally detectable in upper and lower  respiratory specimens during the acute phase of infection.  Positive  results are indicative of active infection with SARS-CoV-2.  Clinical  correlation with patient history and other diagnostic information is  necessary  to determine patient infection status.  Positive results do  not rule out bacterial infection or co-infection with other viruses. If result is PRESUMPTIVE POSTIVE SARS-CoV-2 nucleic acids MAY BE PRESENT.   A presumptive positive result was obtained on the submitted specimen  and confirmed on repeat testing.  While 2019 novel coronavirus  (SARS-CoV-2) nucleic acids may be present in the submitted sample  additional confirmatory testing may be necessary for epidemiological  and / or clinical management purposes  to differentiate between  SARS-CoV-2 and other Sarbecovirus currently known to infect humans.  If clinically indicated additional testing with an alternate test  methodology (LAB74 [id number]) is advised. The SARS-CoV-2 RNA is generally  detectable in upper and lower respiratory specimens during the acute  phase of infection. The expected result is Negative. Fact Sheet for Patients:  StrictlyIdeas.no Fact Sheet for Healthcare Providers: BankingDealers.co.za This test is not yet approved or cleared by the [place] FDA and has been authorized for detection and/or diagnosis of SARS-CoV-2 by FDA under an Emergency Use Authorization (EUA).  This EUA will remain in effect (meaning this test can be used) for the duration of the COVID-19 declaration under Section 564(b)(1) of the Act, 21 U.S.C. section 360bbb-3(b)(1), unless the authorization is terminated or revoked sooner. Performed at [facility], [place] [address], [place], [place] [address]   Lactic acid, plasma     Status: None   Collection Time: 01/25/19  9:03 AM  Result Value Ref Range   Lactic Acid, Venous 1.5 0.5 - 1.9 mmol/L    Comment: Performed at [facility] [address], [place], [place] [address]   *Note: Due to a large number of results and/or encounters for the requested time period, some results have not been displayed. A complete set of results can be found  in Results Review.   Dg Chest 1 View  Result Date: 01/24/2019 CLINICAL DATA:  Shortness of breath EXAM: CHEST  1 VIEW COMPARISON:  August 27, 2018 FINDINGS: The heart size and mediastinal contours are within normal limits. There is mildly increased hazy airspace opacity seen at the bilateral lower lungs. Overall shallow degree of aeration. The visualized skeletal structures are unremarkable. IMPRESSION: Mildly increased hazy airspace opacity at both lower lungs which is non-specific could be due to atelectasis and/or early infectious etiology. Electronically Signed   By: Prudencio Pair M.D.   On: 01/24/2019 21:19    Pending Labs Unresulted Labs (From admission, onward)    Start     Ordered   01/25/19 0829  Culture, blood (Routine X 2) w Reflex to ID Panel  BLOOD CULTURE X 2,   R (with STAT occurrences)     01/25/19 0828   Signed and Held  ABO/Rh  Once,   R     Signed and Held   Signed and Held  Type and screen [place] -  STAT,   R    Comments: [place]    Signed and Held   Signed and Held  Brain natriuretic peptide  ONCE - STAT,   R     Signed and Held   Signed and Held  C-reactive protein  ONCE - STAT,   R     Signed and Held   Signed and Held  Hepatic function panel  Add-on,   R     Signed and Held   Signed and Held  D-dimer, quantitative (not at [facility])  ONCE - STAT,   R     Signed and Held   Signed and Held  Ferritin  ONCE - STAT,   R     Signed and Held   Signed and Held  Fibrinogen  ONCE - STAT,   R     Signed and Held   Signed and Held  Hepatitis B surface antigen  ONCE - STAT,   R     Signed and Held   Signed and Held  Lactate dehydrogenase  ONCE - STAT,   R     Signed and Held   Signed and Held  Procalcitonin  ONCE - STAT,   R     Signed and Held   Signed and Held  Phosphorus  Daily,   R     Signed and Held   Signed and Held  Magnesium  Daily,   R     Signed and Held   Signed and Held  Ferritin  Daily,   R     Signed and Held    Signed and Held  D-dimer, quantitative (not at [facility])  Daily,   R     Signed and Held   Signed and Held  C-reactive protein  Daily,   R     Signed and Held   Signed and Held  Comprehensive metabolic panel  Daily,   R     Signed and Held   Signed and Held  CBC with Differential/Platelet  Daily,   R     Signed and Held          Vitals/Pain Today's Vitals   01/25/19 0720 01/25/19 0826 01/25/19 0958 01/25/19 1000  BP: 127/81 (!) 156/76 (!) 161/78 (!) 147/79  Pulse: 96 (!) 102 95 97  Resp: 18 20 (!) 21 (!) 22  Temp: 99.1 F (37.3 C) 99.2 F (37.3 C)    TempSrc: Oral Oral    SpO2: 95% 96% 100% 100%  Weight:      Height:      PainSc:  9       Isolation Precautions Airborne and Contact precautions  Medications Medications  sodium chloride flush (NS) 0.9 % injection 3 mL (has no administration in time range)  sodium chloride 0.9 % bolus 1,000 mL (0 mLs Intravenous Stopped 01/25/19 0949)  cefTRIAXone (ROCEPHIN) 2 g in sodium chloride 0.9 % 100 mL IVPB (0 g Intravenous Stopped 01/25/19 0949)  azithromycin (ZITHROMAX) 500 mg in sodium chloride 0.9 % 250 mL IVPB (0 mg Intravenous Stopped 01/25/19 1113)    Mobility non-ambulatory High fall risk   Focused Assessments    R Recommendations: See Admitting Provider Note  Report given to:   Additional Notes:

## 2019-01-25 NOTE — ED Notes (Signed)
Report called to Copy at [facility]

## 2019-01-25 NOTE — ED Notes (Signed)
[facility] called for transport. 

## 2019-01-25 NOTE — ED Notes (Signed)
"  Called daughter [name] aware she is being transferred to [facility]

## 2019-01-25 NOTE — ED Notes (Signed)
Family at bedside. 

## 2019-01-25 NOTE — ED Provider Notes (Signed)
[facility] Emergency Department Provider Note MRN:  [id number]  Arrival date & time: 01/25/19     Chief Complaint   Chest Pain and Shortness of Breath   History of Present Illness   [name] is a 84 y.o. year-old female with a history of CAD, diabetes presenting to the ED with chief complaint of chest pain and shortness of breath.  Soreness in the chest with shortness of breath.  Has been unable to do her normal transitioning at home.  She does not walk at baseline, however normally she can transition to the bedside commode on her own.  She has been unable to do so due to her symptoms.  She has been feeling subjective fevers at home.  Increased cough.  Denies headache or vision change, no abdominal pain, no dysuria.  General malaise and fatigue.  Review of Systems  A complete 10 system review of systems was obtained and all systems are negative except as noted in the HPI and PMH.   Patient's Health History    Past Medical History:  Diagnosis Date  . Allergy    takes Mucinex daily as needed  . Anemia, unspecified   . Anxiety    takes Clonazepam daily as needed  . Arthritis   . Cancer (HCC)    breast  . CHF (congestive heart failure) ([place])    takes Furosemide daily  . CKD (chronic kidney disease)   . Coronary artery disease    a. s/p IMI 2004 tx with BMS to RCA;  b. s/p Promus DES to RCA 2/12 c. 06/2014 High risk stress test follow up cath 06/2014 showd patent stent--< med therapy for other mild to moedrate disease   . Coronary atherosclerosis of native coronary artery   . Depression    takes Cymbalta daily  . Diabetes mellitus    insulin daily  . Dysuria   . GERD (gastroesophageal reflux disease)    takes Protonix daily  . Gout, unspecified    takes Colchicine daily  . Headache    occasionally  . History of blood clots about 21yrs ago   in legs-takes Coumadin   . Hyperlipidemia    takes Pravastatin daily  . Hypertension    takes Imdur  and Metoprolol daily  . Insomnia   . Joint pain   . Joint swelling   . Myocardial infarction ([place]) 2004  . Numbness   . Obstructive sleep apnea    does not wear cpap  . Osteoarthritis   . Osteoarthritis   . Osteoarthrosis, unspecified whether generalized or localized, lower leg   . Pain, chronic   . Polymyalgia rheumatica ([place])   . Pulmonary emboli ([place]) 9/13   felt to need lifelong anticoagulation  . Shortness of breath dyspnea    with exertion and has Albuterol inhaler prn  . Type II or unspecified type diabetes mellitus without mention of complication, uncontrolled   . Urinary incontinence    takes Linzess daily  . Vertigo    hx of;was taking Meclizine if needed  . Wears dentures     Past Surgical History:  Procedure Laterality Date  . ABDOMINAL HYSTERECTOMY     partial  . APPENDECTOMY    . blood clots/legs and lungs  2013  . BREAST BIOPSY Left 07/22/2014  . BREAST BIOPSY Left 02/10/2013  . BREAST LUMPECTOMY Left 11/05/2014  . BREAST LUMPECTOMY WITH RADIOACTIVE SEED LOCALIZATION Left 11/05/2014   Procedure: LEFT BREAST LUMPECTOMY WITH RADIOACTIVE SEED LOCALIZATION;  Surgeon: Nathaneil Canary  Ninfa Linden, MD;  Location: [place];  Service: General;  Laterality: Left;  . CARDIAC CATHETERIZATION    . COLONOSCOPY    . CORONARY ANGIOPLASTY  2  . ESOPHAGOGASTRODUODENOSCOPY (EGD) WITH PROPOFOL N/A 11/07/2016   Procedure: ESOPHAGOGASTRODUODENOSCOPY (EGD) WITH PROPOFOL;  Surgeon: Gatha Mayer, MD;  Location: [facility];  Service: Endoscopy;  Laterality: N/A;  . EXCISION OF SKIN TAG Right 11/05/2014   Procedure: EXCISION OF RIGHT EYELID SKIN TAG;  Surgeon: [name], MD;  Location: [place];  Service: General;  Laterality: Right;  . EYE SURGERY Bilateral    cataract   . GASTRIC BYPASS  1977    reversed in 1979, [place] N/A 06/29/2014   Procedure: LEFT HEART CATHETERIZATION WITH CORONARY ANGIOGRAM;  Surgeon: Troy Sine, MD;   Location: [facility] CATH LAB;  Service: Cardiovascular;  Laterality: N/A;  . MEMBRANE PEEL Right 10/23/2018   Procedure: MEMBRANE PEEL;  Surgeon: Hurman Horn, MD;  Location: [place];  Service: Ophthalmology;  Laterality: Right;  . MI with stent placement  2004  . PARS PLANA VITRECTOMY Right 10/23/2018   Procedure: PARS PLANA VITRECTOMY WITH 25 GAUGE;  Surgeon: Hurman Horn, MD;  Location: [place];  Service: Ophthalmology;  Laterality: Right;    Family History  Problem Relation Age of Onset  . Breast cancer Mother 55  . Heart disease Mother   . Throat cancer Father   . Hypertension Father   . Arthritis Father   . Diabetes Father   . Arthritis Sister   . Obesity Sister   . Diabetes Sister   . Heart disease Cousin   . Colon cancer Neg Hx   . Stomach cancer Neg Hx   . Esophageal cancer Neg Hx     Social History   Socioeconomic History  . Marital status: Widowed    Spouse name: Not on file  . Number of children: Not on file  . Years of education: Not on file  . Highest education level: Not on file  Occupational History  . Occupation: retired  Scientific laboratory technician  . Financial resource strain: Not hard at all  . Food insecurity    Worry: Never true    Inability: Never true  . Transportation needs    Medical: Yes    Non-medical: Yes  Tobacco Use  . Smoking status: Never Smoker  . Smokeless tobacco: Never Used  Substance and Sexual Activity  . Alcohol use: No    Alcohol/week: 0.0 standard drinks  . Drug use: No  . Sexual activity: Not Currently  Lifestyle  . Physical activity    Days per week: 0 days    Minutes per session: 0 min  . Stress: Rather much  Relationships  . Social connections    Talks on phone: Once a week    Gets together: Three times a week    Attends religious service: 1 to 4 times per year    Active member of club or organization: Yes    Attends meetings of clubs or organizations: 1 to 4 times per year    Relationship status: Widowed  . Intimate partner violence     Fear of current or ex partner: No    Emotionally abused: No    Physically abused: No    Forced sexual activity: No  Other Topics Concern  . Not on file  Social History Narrative  . Not on file     Physical Exam  Vital Signs  and Nursing Notes reviewed Vitals:   01/25/19 0958 01/25/19 1000  BP: (!) 161/78 (!) 147/79  Pulse: 95 97  Resp: (!) 21 (!) 22  Temp:    SpO2: 100% 100%    CONSTITUTIONAL: Well-appearing, NAD NEURO:  Alert and oriented x 3, no focal deficits EYES:  eyes equal and reactive ENT/NECK:  no LAD, no JVD CARDIO: Regular rate, well-perfused, normal S1 and S2 PULM: Decreased breath sounds bilateral bases GI/GU:  normal bowel sounds, non-distended, non-tender MSK/SPINE:  No gross deformities, no edema SKIN:  no rash, atraumatic PSYCH:  Appropriate speech and behavior  Diagnostic and Interventional Summary    EKG Interpretation  Date/Time:  Friday January 24 2019 20:27:38 EDT Ventricular Rate:  98 PR Interval:  172 QRS Duration: 86 QT Interval:  364 QTC Calculation: 464 R Axis:   -32 Text Interpretation:  Normal sinus rhythm Left axis deviation Possible Inferior infarct , age undetermined Abnormal ECG Confirmed by Gerlene Fee [phone]) on 01/25/2019 8:19:17 AM      Labs Reviewed  SARS CORONAVIRUS 2 (HOSPITAL ORDER, [place] [facility]) - Abnormal; Notable for the following components:      Result Value   SARS Coronavirus 2 POSITIVE (*)    All other components within normal limits  BASIC METABOLIC PANEL - Abnormal; Notable for the following components:   Sodium 132 (*)    Glucose, Bld 257 (*)    BUN 33 (*)    Creatinine, Ser 1.71 (*)    Calcium 8.3 (*)    GFR calc non Af Amer 27 (*)    GFR calc Af Amer 32 (*)    All other components within normal limits  CBC - Abnormal; Notable for the following components:   RBC 3.41 (*)    Hemoglobin 9.8 (*)    HCT 31.5 (*)    All other components within normal limits  CBG MONITORING,  ED - Abnormal; Notable for the following components:   Glucose-Capillary 223 (*)    All other components within normal limits  CULTURE, BLOOD (ROUTINE X 2)  CULTURE, BLOOD (ROUTINE X 2)  LACTIC ACID, PLASMA  TROPONIN I (HIGH SENSITIVITY)  TROPONIN I (HIGH SENSITIVITY)    DG Chest 1 View  Final Result      Medications  sodium chloride flush (NS) 0.9 % injection 3 mL (has no administration in time range)  azithromycin (ZITHROMAX) 500 mg in sodium chloride 0.9 % 250 mL IVPB (500 mg Intravenous New Bag/Given 01/25/19 0951)  sodium chloride 0.9 % bolus 1,000 mL (0 mLs Intravenous Stopped 01/25/19 0949)  cefTRIAXone (ROCEPHIN) 2 g in sodium chloride 0.9 % 100 mL IVPB (0 g Intravenous Stopped 01/25/19 0949)     Procedures Critical Care  ED Course and Medical Decision Making  I have reviewed the triage vital signs and the nursing notes.  Pertinent labs & imaging results that were available during my care of the patient were reviewed by me and considered in my medical decision making (see below for details).  No significant lower extremity edema, no hypoxia, little to no concern for PE.  Had considered ACS given patient's history of CAD, however patient's troponins are reassuring.  With increased cough, decreased functional status due to shortness of breath, and chest x-ray suggesting early infectious process, I am favoring pneumonia as the underlying etiology.  Patient is without fever, she has no leukocytosis, does not appear to be septic.  Will attempt fluids and IV antibiotics and reassess her functional status to  determine need for admission versus discharge.  Patient has tested positive for coronavirus.  She continues to feel very tired, easily short of breath with exertion, still unable to transition or care for self enough at home.  Will admit to [facility].  SHERRE WOOTON was evaluated in Emergency Department on 01/25/2019 for the symptoms described in the history of present illness.  She was evaluated in the context of the global COVID-19 pandemic, which necessitated consideration that the patient might be at risk for infection with the SARS-CoV-2 virus that causes COVID-19. Institutional protocols and algorithms that pertain to the evaluation of patients at risk for COVID-19 are in a state of rapid change based on information released by regulatory bodies including the CDC and federal and state organizations. These policies and algorithms were followed during the patient's care in the ED.   Barth Kirks. Sedonia Small, [place] [email or web address]  Final Clinical Impressions(s) / ED Diagnoses     ICD-10-CM   1. COVID-19  U07.1    J98.8   2. SOB (shortness of breath)  R06.02 DG Chest 1 View    DG Chest 1 View  3. Chest pain  R07.9 DG Chest 1 View    DG Chest 1 View  4. Shortness of breath  R06.02     ED Discharge Orders    None      Discharge Instructions Discussed with and Provided to Patient: Discharge Instructions   None       Maudie Flakes, MD 01/25/19 1027

## 2019-01-25 NOTE — ED Notes (Signed)
Hospitalist Dr. Tamala Julian at bedside.,

## 2019-01-25 NOTE — ED Notes (Signed)
Patient repositioned on her right side.

## 2019-01-26 LAB — CBC WITH DIFFERENTIAL/PLATELET
Abs Immature Granulocytes: 0.02 10*3/uL (ref 0.00–0.07)
Basophils Absolute: 0 10*3/uL (ref 0.0–0.1)
Basophils Relative: 0 %
Eosinophils Absolute: 0 10*3/uL (ref 0.0–0.5)
Eosinophils Relative: 0 %
HCT: 28.8 % — ABNORMAL LOW (ref 36.0–46.0)
Hemoglobin: 8.8 g/dL — ABNORMAL LOW (ref 12.0–15.0)
Immature Granulocytes: 0 %
Lymphocytes Relative: 15 %
Lymphs Abs: 0.9 10*3/uL (ref 0.7–4.0)
MCH: 28.5 pg (ref 26.0–34.0)
MCHC: 30.6 g/dL (ref 30.0–36.0)
MCV: 93.2 fL (ref 80.0–100.0)
Monocytes Absolute: 0.2 10*3/uL (ref 0.1–1.0)
Monocytes Relative: 3 %
Neutro Abs: 5 10*3/uL (ref 1.7–7.7)
Neutrophils Relative %: 82 %
Platelets: 208 10*3/uL (ref 150–400)
RBC: 3.09 MIL/uL — ABNORMAL LOW (ref 3.87–5.11)
RDW: 13.8 % (ref 11.5–15.5)
WBC: 6.1 10*3/uL (ref 4.0–10.5)
nRBC: 0 % (ref 0.0–0.2)

## 2019-01-26 LAB — COMPREHENSIVE METABOLIC PANEL
ALT: 15 U/L (ref 0–44)
AST: 20 U/L (ref 15–41)
Albumin: 2.7 g/dL — ABNORMAL LOW (ref 3.5–5.0)
Alkaline Phosphatase: 67 U/L (ref 38–126)
Anion gap: 10 (ref 5–15)
BUN: 38 mg/dL — ABNORMAL HIGH (ref 8–23)
CO2: 22 mmol/L (ref 22–32)
Calcium: 8.4 mg/dL — ABNORMAL LOW (ref 8.9–10.3)
Chloride: 100 mmol/L (ref 98–111)
Creatinine, Ser: 1.65 mg/dL — ABNORMAL HIGH (ref 0.44–1.00)
GFR calc Af Amer: 33 mL/min — ABNORMAL LOW (ref >60)
GFR calc non Af Amer: 29 mL/min — ABNORMAL LOW (ref >60)
Glucose, Bld: 368 mg/dL — ABNORMAL HIGH (ref 70–99)
Potassium: 5.7 mmol/L — ABNORMAL HIGH (ref 3.5–5.1)
Sodium: 132 mmol/L — ABNORMAL LOW (ref 135–145)
Total Bilirubin: 0.2 mg/dL — ABNORMAL LOW (ref 0.3–1.2)
Total Protein: 7.3 g/dL (ref 6.5–8.1)

## 2019-01-26 LAB — GLUCOSE, CAPILLARY
Glucose-Capillary: 346 mg/dL — ABNORMAL HIGH (ref 70–99)
Glucose-Capillary: 305 mg/dL — ABNORMAL HIGH (ref 70–99)
Glucose-Capillary: 292 mg/dL — ABNORMAL HIGH (ref 70–99)
Glucose-Capillary: 211 mg/dL — ABNORMAL HIGH (ref 70–99)

## 2019-01-26 LAB — FERRITIN: Ferritin: 214 ng/mL (ref 11–307)

## 2019-01-26 LAB — POTASSIUM: Potassium: 5.1 mmol/L (ref 3.5–5.1)

## 2019-01-26 LAB — C-REACTIVE PROTEIN: CRP: 10.4 mg/dL — ABNORMAL HIGH (ref <1.0)

## 2019-01-26 LAB — D-DIMER, QUANTITATIVE: D-Dimer, Quant: 0.97 ug/mL-FEU — ABNORMAL HIGH (ref 0.00–0.50)

## 2019-01-26 MED ORDER — INSULIN ASPART 100 UNIT/ML ~~LOC~~ SOLN
0.0000 [IU] | Freq: Three times a day (TID) | SUBCUTANEOUS | Status: DC
  Administered 2019-01-26: 15 [IU] via SUBCUTANEOUS
  Administered 2019-01-26: 11 [IU] via SUBCUTANEOUS
  Administered 2019-01-26: 13:00:00 15 [IU] via SUBCUTANEOUS
  Administered 2019-01-27: 11 [IU] via SUBCUTANEOUS
  Administered 2019-01-27: 15 [IU] via SUBCUTANEOUS
  Administered 2019-01-27: 11 [IU] via SUBCUTANEOUS
  Administered 2019-01-28: 20 [IU] via SUBCUTANEOUS
  Administered 2019-01-28: 11 [IU] via SUBCUTANEOUS
  Administered 2019-01-28: 20 [IU] via SUBCUTANEOUS
  Administered 2019-01-29: 7 [IU] via SUBCUTANEOUS
  Administered 2019-01-29 (×2): 20 [IU] via SUBCUTANEOUS
  Administered 2019-01-30: 4 [IU] via SUBCUTANEOUS
  Administered 2019-01-30: 13:00:00 11 [IU] via SUBCUTANEOUS

## 2019-01-26 MED ORDER — SODIUM CHLORIDE 0.9 % IV SOLN
100.0000 mg | INTRAVENOUS | Status: AC
  Administered 2019-01-27 – 2019-01-30 (×4): 100 mg via INTRAVENOUS
  Filled 2019-01-26 (×4): qty 20

## 2019-01-26 MED ORDER — SODIUM ZIRCONIUM CYCLOSILICATE 10 G PO PACK
10.0000 g | PACK | Freq: Once | ORAL | Status: AC
  Administered 2019-01-26: 10 g via ORAL
  Filled 2019-01-26: qty 1

## 2019-01-26 MED ORDER — INSULIN GLARGINE 100 UNIT/ML ~~LOC~~ SOLN
30.0000 [IU] | Freq: Every day | SUBCUTANEOUS | Status: DC
  Administered 2019-01-26 – 2019-01-27 (×2): 30 [IU] via SUBCUTANEOUS
  Filled 2019-01-26 (×2): qty 0.3

## 2019-01-26 MED ORDER — ACETAMINOPHEN 325 MG PO TABS
650.0000 mg | ORAL_TABLET | Freq: Four times a day (QID) | ORAL | Status: DC | PRN
  Administered 2019-01-26 – 2019-01-29 (×2): 650 mg via ORAL
  Filled 2019-01-26 (×3): qty 2

## 2019-01-26 MED ORDER — SODIUM CHLORIDE 0.9 % IV SOLN
200.0000 mg | Freq: Once | INTRAVENOUS | Status: AC
  Administered 2019-01-26: 200 mg via INTRAVENOUS
  Filled 2019-01-26: qty 40

## 2019-01-26 MED ORDER — INSULIN ASPART 100 UNIT/ML ~~LOC~~ SOLN
0.0000 [IU] | Freq: Every day | SUBCUTANEOUS | Status: DC
  Administered 2019-01-26: 2 [IU] via SUBCUTANEOUS
  Administered 2019-01-27: 3 [IU] via SUBCUTANEOUS
  Administered 2019-01-28 – 2019-01-29 (×2): 5 [IU] via SUBCUTANEOUS

## 2019-01-26 MED ORDER — POLYETHYLENE GLYCOL 3350 17 G PO PACK
17.0000 g | PACK | Freq: Every day | ORAL | Status: DC
  Administered 2019-01-26 – 2019-01-30 (×5): 17 g via ORAL
  Filled 2019-01-26 (×5): qty 1

## 2019-01-26 MED ORDER — FAMOTIDINE 20 MG PO TABS
20.0000 mg | ORAL_TABLET | Freq: Every day | ORAL | Status: DC
  Administered 2019-01-26 – 2019-01-30 (×5): 20 mg via ORAL
  Filled 2019-01-26 (×5): qty 1

## 2019-01-26 MED ORDER — INSULIN ASPART 100 UNIT/ML ~~LOC~~ SOLN
6.0000 [IU] | Freq: Three times a day (TID) | SUBCUTANEOUS | Status: DC
  Administered 2019-01-26 – 2019-01-30 (×13): 6 [IU] via SUBCUTANEOUS

## 2019-01-26 NOTE — Progress Notes (Signed)
Results for ELAJAH, KUNSMAN (MRN [id number]) as of 01/26/2019 09:10  Ref. Range 01/26/2019 07:47  Glucose-Capillary Latest Ref Range: 70 - 99 mg/dL 346 (H)   MD notified

## 2019-01-26 NOTE — Plan of Care (Signed)
  Problem: Health Behavior/Discharge Planning: Goal: Ability to manage health-related needs will improve Outcome: Progressing   Problem: Clinical Measurements: Goal: Ability to maintain clinical measurements within normal limits will improve Outcome: Progressing Goal: Will remain free from infection Outcome: Progressing Goal: Respiratory complications will improve Outcome: Progressing   Problem: Activity: Goal: Risk for activity intolerance will decrease Outcome: Progressing   Problem: Coping: Goal: Level of anxiety will decrease Outcome: Progressing   Problem: Safety: Goal: Ability to remain free from injury will improve Outcome: Progressing   Problem: Skin Integrity: Goal: Risk for impaired skin integrity will decrease Outcome: Progressing   Problem: Education: Goal: Knowledge of risk factors and measures for prevention of condition will improve Outcome: Progressing   Problem: Respiratory: Goal: Will maintain a patent airway Outcome: Progressing Goal: Complications related to the disease process, condition or treatment will be avoided or minimized Outcome: Progressing  No acute changes this shift. Up to [name] w/ x2 AS. VSS. Fall precautions maintained. Call light in reach.

## 2019-01-26 NOTE — Plan of Care (Signed)
Pt is alert and oriented is on room air.  Will continue to monitor.

## 2019-01-26 NOTE — Progress Notes (Signed)
PROGRESS NOTE  [name] EGB:[id number] DOB: 1935-02-02 DOA: 01/24/2019  PCP: [name], NP  Brief History/Interval Summary: 84 y.o. female with medical history significant of hypertension, hyperlipidemia, chronic diastolic CHF, insulin-dependent diabetes mellitus, CAD, depression, anxiety, chronic pain syndrome, GERD, and morbid obesity; who presented with complaints of chest pain and shortness of breath for the last 3-4 days.  At baseline patient is wheelchair-bound, but usually able to transition on her own.  She reports going down to [place] to visit family approximately 2 weeks ago where someone had found to be positive for COVID-19.  COVID-19 screening was positive. Chest x-ray revealed bilateral hazy opacities concerning for atelectasis versus infection.    Patient was admitted to [facility].  Reason for Visit: Pneumonia due to COVID-19  Consultants: None  Procedures: None  Antibiotics: Anti-infectives (From admission, onward)   Start     Dose/Rate Route Frequency Ordered Stop   01/26/19 1000  azithromycin (ZITHROMAX) 500 mg in sodium chloride 0.9 % 250 mL IVPB  Status:  Discontinued     500 mg 250 mL/hr over 60 Minutes Intravenous Every 24 hours 01/25/19 1235 01/25/19 1641   01/26/19 0900  cefTRIAXone (ROCEPHIN) 2 g in sodium chloride 0.9 % 100 mL IVPB  Status:  Discontinued     2 g 200 mL/hr over 30 Minutes Intravenous Every 24 hours 01/25/19 1235 01/25/19 1641   01/25/19 0830  cefTRIAXone (ROCEPHIN) 2 g in sodium chloride 0.9 % 100 mL IVPB     2 g 200 mL/hr over 30 Minutes Intravenous  Once 01/25/19 0828 01/25/19 0949   01/25/19 0830  azithromycin (ZITHROMAX) 500 mg in sodium chloride 0.9 % 250 mL IVPB     500 mg 250 mL/hr over 60 Minutes Intravenous  Once 01/25/19 0828 01/25/19 1113       Subjective/Interval History: Patient states that she has some shortness of breath with cough.  However she states that she is feeling slightly better compared  to yesterday.  Denies any nausea vomiting.  No abdominal pain.    Assessment/Plan:  Pneumonia due to COVID-19  COVID-19 Labs  Recent Labs    01/25/19 1324 01/26/19 0110  DDIMER 1.19* 0.97*  FERRITIN 222 214  LDH 179  --   CRP 9.8* 10.4*    Lab Results  Component Value Date   SARSCOV2NAA POSITIVE (A) 01/25/2019   SARSCOV2NAA NOT DETECTED 10/19/2018     Fever: Has been afebrile the last 24 hours Oxygen requirements: Room air.  Saturating in the early 90s. Antibacterials: Started on ceftriaxone and azithromycin. Remdesivir: Will be initiated today Steroids: Dexamethasone 6 mg daily Diuretics: None currently Actemra: Not indicated currently Vitamin C and Zinc: Continue DVT Prophylaxis: On anticoagulation with rivaroxaban  Patient seems to be stable from a respiratory standpoint.  She still has shortness of breath and is saturating in the early 90s.  Imaging studies did show opacities concerning for pneumonia.  Patient was started on steroids.  Patient was started on antibacterial agents.  WBC however is noted to be normal.  Procalcitonin was less than 0.1.  Pneumonia is most likely due to COVID-19.  We will stop her antibacterial agents.  Initiate Remdesivir.  CRP is noted to be elevated at 10.4.  D-dimer 0.97.  Ferritin 214.  Incentive spirometry, prone positioning, mobilization as much as possible.  PT and OT consultation.   Chest pain in the setting of coronary artery disease status post PCI Patient was experiencing chest pain at the time of  admission.  EKG did not show any significant changes.  Troponin was normal.  Most likely due to the pneumonia.  Could have been due to stable angina as well.  Currently chest pain-free.  She had an MI in 2004 and had a bare-metal stent placed to the RCA and then again in 2012 for unstable angina.  Continue medical management.  Essential hypertension Continue antihypertensive medications.  Monitor blood pressures closely.  Chronic  kidney disease stage III/hyperkalemia Baseline creatinine 1.5-1.8.  Renal function seems to be at baseline.  Potassium noted to be elevated today.  She will be given Lokelma x1.  Recheck labs later today.  Chronic diastolic CHF Does not appear to be fluid overloaded.  Monitor closely.  Monitor volume status closely.  Holding her diuretics.  Insulin-dependent diabetes mellitus, uncontrolled with hyperglycemia HbA1c 11.7 in August.  Currently on Lantus insulin.  Worsening glycemic control probably due to steroids.  SSI.  May need adjustment of Lantus dose.  History of COPD with chronic bronchitis No wheezing currently.  Stable.  Continue inhalers.  Anemia of chronic disease Baseline hemoglobin 9-10.  Mild drop in hemoglobin likely due to blood draws and dilution.  Continue to monitor.  No evidence of overt bleeding.  Chronic pain syndrome Continue home medications including oxycodone.  Hyponatremia Stable.  Continue to monitor.  Probably multifactorial.  History of anxiety and depression Stable.  Continue Cymbalta.  Mild dementia Continue Namenda.  Hyperlipidemia Continue statin.  History of DVT and PE on chronic anticoagulation Continue rivaroxaban.  Morbid obesity Estimated body mass index is 53.27 kg/m as calculated from the following:   Height as of this encounter: 5\' 6"  (1.676 m).   Weight as of this encounter: 149.7 kg.   DVT Prophylaxis: On rivaroxaban PUD Prophylaxis: Continue Pepcid Code Status: DNR Family Communication: Will update family Disposition Plan: Came from home.  Hopefully she will be able to go back to that setting.  PT and OT evaluation.   Medications:  Scheduled: . anastrozole  1 mg Oral Daily  . dexamethasone (DECADRON) injection  6 mg Intravenous Q24H  . DULoxetine  60 mg Oral Daily  . febuxostat  40 mg Oral Daily  . hydrALAZINE  50 mg Oral BID  . insulin aspart  0-20 Units Subcutaneous TID WC  . insulin aspart  0-5 Units Subcutaneous QHS   . insulin aspart  6 Units Subcutaneous TID WC  . insulin glargine  20 Units Subcutaneous Daily  . Ipratropium-Albuterol  1 puff Inhalation Q6H  . isosorbide mononitrate  60 mg Oral Daily  . magnesium oxide  400 mg Oral Daily  . memantine  10 mg Oral q morning - 10a  . memantine  5 mg Oral QHS  . metoprolol succinate  25 mg Oral Daily  . mometasone-formoterol  2 puff Inhalation BID  . pravastatin  20 mg Oral Daily  . rivaroxaban  10 mg Oral Daily  . sodium chloride flush  3 mL Intravenous Once  . sodium zirconium cyclosilicate  10 g Oral Once  . vitamin C  500 mg Oral Daily  . zinc sulfate  220 mg Oral Daily   Continuous: . famotidine (PEPCID) IV 20 mg (01/26/19 0909)   VQM:GQQPYPPJKDTOIZTI-WPYKDXIPJAS, clonazePAM, guaiFENesin-dextromethorphan, meclizine, nitroGLYCERIN, ondansetron **OR** ondansetron (ZOFRAN) IV, oxyCODONE-acetaminophen **AND** oxyCODONE   Objective:  Vital Signs  Vitals:   01/26/19 0234 01/26/19 0415 01/26/19 0748 01/26/19 0749  BP:  137/65  (!) 150/69  Pulse:  73  64  Resp:  18  18  Temp:  97.8 F (36.6 C) 98 F (36.7 C) 98.6 F (37 C)  TempSrc:  Axillary  Axillary  SpO2:  94%  94%  Weight: (!) 149.7 kg     Height:        Intake/Output Summary (Last 24 hours) at 01/26/2019 0954 Last data filed at 01/26/2019 0300 Gross per 24 hour  Intake 525.18 ml  Output -  Net 525.18 ml   Filed Weights   01/24/19 2034 01/25/19 1232 01/26/19 0234  Weight: (!) 152 kg (!) 149.7 kg (!) 149.7 kg    General appearance: Awake alert.  In no distress.  Morbidly obese Resp: Coarse breath sounds bilaterally.  Crackles at the bilaterally.  No wheezing or rhonchi.  Normal effort at rest. Cardio: S1-S2 is normal regular.  No S3-S4.  No rubs murmurs or bruit GI: Abdomen is soft.  Nontender nondistended.  Bowel sounds are present normal.  No masses organomegaly Extremities: No edema.  Full range of motion of lower extremities. Neurologic: Alert and oriented x3.  No focal  neurological deficits.    Lab Results:  Data Reviewed: I have personally reviewed following labs and imaging studies  CBC: Recent Labs  Lab 01/24/19 2042 01/26/19 0110  WBC 6.0 6.1  NEUTROABS  --  5.0  HGB 9.8* 8.8*  HCT 31.5* 28.8*  MCV 92.4 93.2  PLT 212 644    Basic Metabolic Panel: Recent Labs  Lab 01/24/19 2042 01/26/19 0110  NA 132* 132*  K 4.5 5.7*  CL 98 100  CO2 23 22  GLUCOSE 257* 368*  BUN 33* 38*  CREATININE 1.71* 1.65*  CALCIUM 8.3* 8.4*    GFR: Estimated Creatinine Clearance: 39.6 mL/min (A) (by C-G formula based on SCr of 1.65 mg/dL (H)).  Liver Function Tests: Recent Labs  Lab 01/25/19 1324 01/26/19 0110  AST 22 20  ALT 17 15  ALKPHOS 71 67  BILITOT 0.4 0.2*  PROT 7.8 7.3  ALBUMIN 2.6* 2.7*    CBG: Recent Labs  Lab 01/25/19 0823 01/25/19 1624 01/26/19 0747  GLUCAP 223* 210* 346*    Anemia Panel: Recent Labs    01/25/19 1324 01/26/19 0110  FERRITIN 222 214    Recent Results (from the past 240 hour(s))  SARS Coronavirus 2 [facility] order, Performed in [facility] hospital lab) Nasopharyngeal Nasopharyngeal Swab     Status: Abnormal   Collection Time: 01/25/19  8:50 AM   Specimen: Nasopharyngeal Swab  Result Value Ref Range Status   SARS Coronavirus 2 POSITIVE (A) NEGATIVE Final    Comment: RESULT CALLED TO, READ BACK BY AND VERIFIED WITH: H. MOON RN, AT 0347 01/25/19 BY D. VANHOOK (NOTE) If result is NEGATIVE SARS-CoV-2 target nucleic acids are NOT DETECTED. The SARS-CoV-2 RNA is generally detectable in upper and lower  respiratory specimens during the acute phase of infection. The lowest  concentration of SARS-CoV-2 viral copies this assay can detect is 250  copies / mL. A negative result does not preclude SARS-CoV-2 infection  and should not be used as the sole basis for treatment or other  patient management decisions.  A negative result may occur with  improper specimen collection / handling, submission of specimen  other  than nasopharyngeal swab, presence of viral mutation(s) within the  areas targeted by this assay, and inadequate number of viral copies  (<250 copies / mL). A negative result must be combined with clinical  observations, patient history, and epidemiological information. If result is POSITIVE SARS-CoV-2 target nucleic acids are DETECT  ED. The SARS-CoV-2 RNA is generally detectable in upper and lower  respiratory specimens during the acute phase of infection.  Positive  results are indicative of active infection with SARS-CoV-2.  Clinical  correlation with patient history and other diagnostic information is  necessary to determine patient infection status.  Positive results do  not rule out bacterial infection or co-infection with other viruses. If result is PRESUMPTIVE POSTIVE SARS-CoV-2 nucleic acids MAY BE PRESENT.   A presumptive positive result was obtained on the submitted specimen  and confirmed on repeat testing.  While 2019 novel coronavirus  (SARS-CoV-2) nucleic acids may be present in the submitted sample  additional confirmatory testing may be necessary for epidemiological  and / or clinical management purposes  to differentiate between  SARS-CoV-2 and other Sarbecovirus currently known to infect humans.  If clinically indicated additional testing with an alternate test  methodology (LAB74 53) is advised. The SARS-CoV-2 RNA is generally  detectable in upper and lower respiratory specimens during the acute  phase of infection. The expected result is Negative. Fact Sheet for Patients:  StrictlyIdeas.no Fact Sheet for Healthcare Providers: BankingDealers.co.za This test is not yet approved or cleared by the [place] FDA and has been authorized for detection and/or diagnosis of SARS-CoV-2 by FDA under an Emergency Use Authorization (EUA).  This EUA will remain in effect (meaning this test can be used) for the duration  of the COVID-19 declaration under Section 564(b)(1) of the Act, 21 U.S.C. section 360bbb-3(b)(1), unless the authorization is terminated or revoked sooner. Performed at [facility], [place] [address], [place], [place] [address]       Radiology Studies: Dg Chest 1 View  Result Date: 01/24/2019 CLINICAL DATA:  Shortness of breath EXAM: CHEST  1 VIEW COMPARISON:  August 27, 2018 FINDINGS: The heart size and mediastinal contours are within normal limits. There is mildly increased hazy airspace opacity seen at the bilateral lower lungs. Overall shallow degree of aeration. The visualized skeletal structures are unremarkable. IMPRESSION: Mildly increased hazy airspace opacity at both lower lungs which is non-specific could be due to atelectasis and/or early infectious etiology. Electronically Signed   By: Prudencio Pair M.D.   On: 01/24/2019 21:19       LOS: 1 day   [facility] Pager on [email or web address]  01/26/2019, 9:54 AM

## 2019-01-27 ENCOUNTER — Ambulatory Visit: Payer: PPO | Admitting: Nurse Practitioner

## 2019-01-27 LAB — D-DIMER, QUANTITATIVE: D-Dimer, Quant: 0.75 ug/mL-FEU — ABNORMAL HIGH (ref 0.00–0.50)

## 2019-01-27 LAB — COMPREHENSIVE METABOLIC PANEL
ALT: 14 U/L (ref 0–44)
AST: 17 U/L (ref 15–41)
Albumin: 2.8 g/dL — ABNORMAL LOW (ref 3.5–5.0)
Alkaline Phosphatase: 59 U/L (ref 38–126)
Anion gap: 10 (ref 5–15)
BUN: 49 mg/dL — ABNORMAL HIGH (ref 8–23)
CO2: 22 mmol/L (ref 22–32)
Calcium: 8.7 mg/dL — ABNORMAL LOW (ref 8.9–10.3)
Chloride: 102 mmol/L (ref 98–111)
Creatinine, Ser: 1.55 mg/dL — ABNORMAL HIGH (ref 0.44–1.00)
GFR calc Af Amer: 36 mL/min — ABNORMAL LOW (ref >60)
GFR calc non Af Amer: 31 mL/min — ABNORMAL LOW (ref >60)
Glucose, Bld: 262 mg/dL — ABNORMAL HIGH (ref 70–99)
Potassium: 5.8 mmol/L — ABNORMAL HIGH (ref 3.5–5.1)
Sodium: 134 mmol/L — ABNORMAL LOW (ref 135–145)
Total Bilirubin: 0.5 mg/dL (ref 0.3–1.2)
Total Protein: 7.2 g/dL (ref 6.5–8.1)

## 2019-01-27 LAB — CBC WITH DIFFERENTIAL/PLATELET
Abs Immature Granulocytes: 0.04 10*3/uL (ref 0.00–0.07)
Basophils Absolute: 0 10*3/uL (ref 0.0–0.1)
Basophils Relative: 0 %
Eosinophils Absolute: 0 10*3/uL (ref 0.0–0.5)
Eosinophils Relative: 0 %
HCT: 27.5 % — ABNORMAL LOW (ref 36.0–46.0)
Hemoglobin: 8.6 g/dL — ABNORMAL LOW (ref 12.0–15.0)
Immature Granulocytes: 0 %
Lymphocytes Relative: 12 %
Lymphs Abs: 1.1 10*3/uL (ref 0.7–4.0)
MCH: 28.8 pg (ref 26.0–34.0)
MCHC: 31.3 g/dL (ref 30.0–36.0)
MCV: 92 fL (ref 80.0–100.0)
Monocytes Absolute: 0.3 10*3/uL (ref 0.1–1.0)
Monocytes Relative: 3 %
Neutro Abs: 7.7 10*3/uL (ref 1.7–7.7)
Neutrophils Relative %: 85 %
Platelets: 231 10*3/uL (ref 150–400)
RBC: 2.99 MIL/uL — ABNORMAL LOW (ref 3.87–5.11)
RDW: 13.5 % (ref 11.5–15.5)
WBC: 9.1 10*3/uL (ref 4.0–10.5)
nRBC: 0 % (ref 0.0–0.2)

## 2019-01-27 LAB — POTASSIUM: Potassium: 4.9 mmol/L (ref 3.5–5.1)

## 2019-01-27 LAB — GLUCOSE, CAPILLARY
Glucose-Capillary: 267 mg/dL — ABNORMAL HIGH (ref 70–99)
Glucose-Capillary: 297 mg/dL — ABNORMAL HIGH (ref 70–99)
Glucose-Capillary: 350 mg/dL — ABNORMAL HIGH (ref 70–99)

## 2019-01-27 LAB — C-REACTIVE PROTEIN: CRP: 6.8 mg/dL — ABNORMAL HIGH (ref <1.0)

## 2019-01-27 LAB — FERRITIN: Ferritin: 188 ng/mL (ref 11–307)

## 2019-01-27 MED ORDER — INSULIN GLARGINE 100 UNIT/ML ~~LOC~~ SOLN
10.0000 [IU] | Freq: Once | SUBCUTANEOUS | Status: AC
  Administered 2019-01-27: 10 [IU] via SUBCUTANEOUS
  Filled 2019-01-27: qty 0.1

## 2019-01-27 MED ORDER — SODIUM ZIRCONIUM CYCLOSILICATE 10 G PO PACK
10.0000 g | PACK | Freq: Once | ORAL | Status: AC
  Administered 2019-01-27: 10 g via ORAL
  Filled 2019-01-27: qty 1

## 2019-01-27 MED ORDER — INSULIN GLARGINE 100 UNIT/ML ~~LOC~~ SOLN
40.0000 [IU] | Freq: Every day | SUBCUTANEOUS | Status: DC
  Administered 2019-01-28 – 2019-01-30 (×3): 40 [IU] via SUBCUTANEOUS
  Filled 2019-01-27 (×3): qty 0.4

## 2019-01-27 NOTE — Consult Note (Signed)
   [facility] Inpatient Consult   01/27/2019  [name] November 26, 1934 [id number]   HTA member:  This patient is being followed by [facility] for Care Management.  [facility] will follow as well as [facility].  [facility] wil sign off.  Natividad Brood, RN BSN [facility] Liaison  [phone] business mobile phone Toll free office [phone]  Fax number: [phone] [email or web address]

## 2019-01-27 NOTE — Plan of Care (Signed)
  Problem: Health Behavior/Discharge Planning: Goal: Ability to manage health-related needs will improve Outcome: Progressing   Problem: Clinical Measurements: Goal: Ability to maintain clinical measurements within normal limits will improve Outcome: Progressing Goal: Will remain free from infection Outcome: Progressing Goal: Respiratory complications will improve Outcome: Progressing   Problem: Activity: Goal: Risk for activity intolerance will decrease Outcome: Progressing   Problem: Coping: Goal: Level of anxiety will decrease Outcome: Progressing   Problem: Safety: Goal: Ability to remain free from injury will improve Outcome: Progressing   Problem: Skin Integrity: Goal: Risk for impaired skin integrity will decrease Outcome: Progressing   Problem: Education: Goal: Knowledge of risk factors and measures for prevention of condition will improve Outcome: Progressing   Problem: Respiratory: Goal: Will maintain a patent airway Outcome: Progressing Goal: Complications related to the disease process, condition or treatment will be avoided or minimized Outcome: Progressing

## 2019-01-27 NOTE — Evaluation (Signed)
Occupational Therapy Evaluation Patient Details Name: [name] MRN: [id number] DOB: 05-04-1935 Today's Date: 01/27/2019    History of Present Illness 84 y.o. female with medical history significant of memory loss, hypertension, hyperlipidemia, CHF, insulin-dependent diabetes mellitus, CAD, depression, anxiety, chronic pain syndrome, GERD, and morbid obesity; who presented 01/25/19 with complaints of chest pain and shortness of breath for the last 3-4 days. At baseline patient is wheelchair-bound, but usually able to transition on her own.  She reports going down to [place] to visit family approximately 2 weeks ago where someone had found to be positive for COVID-19.    Clinical Impression   Pt admitted with above diagnoses, with generalized weakness and decreased activity tolerance/endurance limiting ability to engage in BADL at desired level of ind. PTA living with dtr who assist in ADLs/IADLs, pt uses w/c at baseline and t/fs. She is occasionally a poor historian with inconsistencies t/o eval. She presents with the cognitive deficits described below. She is overall supervision bed level, and min guard-min A for sit <> stands at this time. Pt fatigues easily and continue to c/o "drunk" dizzy feeling without signs of orthostasis. Pt sat EOB to complete table top grooming task at set up A. After session, pt needing assist back to bed per RN staff. Pt min A +2 for safety with stedy back to bed 2/2 fatigue. At this time, recommend CIR at d/c for intensive rehab prior to home. Will continue to follow acutely per POC listed below.     Follow Up Recommendations  CIR;Supervision/Assistance - 24 hour    Equipment Recommendations  Other (comment)(defer to next venue)    Recommendations for Other Services       Precautions / Restrictions Precautions Precautions: Fall;Other (comment) Precaution Comments: dizziness "drunk" feeling; sometimes worse than others Restrictions Weight Bearing  Restrictions: No      Mobility Bed Mobility Overal bed mobility: Needs Assistance Bed Mobility: Supine to Sit     Supine to sit: Supervision     General bed mobility comments: HOB wityhout rail; increased time and effort needed, close supervision for safety 2/2 dizziness  Transfers Overall transfer level: Needs assistance Equipment used: Standard walker Transfers: Sit to/from Stand;Stand Pivot Transfers Sit to Stand: Min assist;From elevated surface Stand pivot transfers: Min guard;+2 safety/equipment       General transfer comment: pt did not want assist other than therapist to stand and block walker; close guard for safety. Upon secon stand pt needing min A to use stedy back to bed 2/2 fatigue    Balance Overall balance assessment: Needs assistance Sitting-balance support: Feet supported;No upper extremity supported Sitting balance-Leahy Scale: Poor Sitting balance - Comments: initially with posterior lean; progressed to holding in neutral    Standing balance support: Bilateral upper extremity supported Standing balance-Leahy Scale: Poor Standing balance comment: reliant on external support                           ADL either performed or assessed with clinical judgement   ADL Overall ADL's : Needs assistance/impaired Eating/Feeding: Sitting;Set up Eating/Feeding Details (indicate cue type and reason): to open packaging Grooming: Set up;Sitting;Oral care Grooming Details (indicate cue type and reason): brushed teeth and dentures while sitting EOB; assist to open packaging Upper Body Bathing: Minimal assistance;Sitting   Lower Body Bathing: Maximal assistance;Sit to/from stand;Sitting/lateral leans   Upper Body Dressing : Minimal assistance;Sitting   Lower Body Dressing: Maximal assistance;Sit to/from stand;Sitting/lateral leans   Toilet  Transfer: Minimal assistance;BSC;RW   Toileting- Clothing Manipulation and Hygiene: Minimal assistance;Sit to/from  stand;Sitting/lateral lean   Tub/ Shower Transfer: Moderate assistance;+2 for safety/equipment;Shower seat;Rolling walker   Functional mobility during ADLs: Minimal assistance;Rolling walker General ADL Comments: pt ltd 2/2 generalized weakness and decreased endurance/activity tolerance     Vision Baseline Vision/History: No visual deficits Patient Visual Report: Blurring of vision Additional Comments: reports recent (suspect cataract sx) where she no longer needs corrective lenses; reports blurring of vision and "drunk feeling" throughout eval. Difficulty tracking, increased dizziness per report     Perception     Praxis      Pertinent Vitals/Pain Pain Assessment: Faces Faces Pain Scale: Hurts even more Pain Location: rt shoulder, with reaching Pain Descriptors / Indicators: Discomfort;Sore Pain Intervention(s): Limited activity within patient's tolerance;Monitored during session;Repositioned     Hand Dominance Right   Extremity/Trunk Assessment Upper Extremity Assessment Upper Extremity Assessment: Generalized weakness;RUE deficits/detail RUE Deficits / Details: reports shoulder pain past 90 degress of flexion   Lower Extremity Assessment Lower Extremity Assessment: Defer to PT evaluation       Communication Communication Communication: HOH   Cognition Arousal/Alertness: Awake/alert Behavior During Therapy: WFL for tasks assessed/performed Overall Cognitive Status: No family/caregiver present to determine baseline cognitive functioning Area of Impairment: Memory                     Memory: Decreased short-term memory         General Comments: pt frequently changes answers; able to recall what hospital she was in but then later states"wait I am in the hospital right"   General Comments       Exercises     Shoulder Instructions      Home Living Family/patient expects to be discharged to:: Private residence Living Arrangements:  Children(dtr) Available Help at Discharge: Family;Available 24 hours/day Type of Home: House Home Access: Level entry     Home Layout: Two level;Able to live on main level with bedroom/bathroom     Bathroom Shower/Tub: Occupational psychologist: (uses BSC) Bathroom Accessibility: Yes How Accessible: Accessible via wheelchair Home Equipment: Shower seat - built in;Bedside commode;Wheelchair - [employer] - 2 wheels;Grab bars - tub/shower;Walker - 4 wheels          Prior Functioning/Environment Level of Independence: Needs assistance  Gait / Transfers Assistance Needed: use walker to pivot to w/c; uses manual w/c that someone pushes for her ADL's / Homemaking Assistance Needed: dtr assists with bathing, dressing            OT Problem List: Decreased strength;Decreased knowledge of use of DME or AE;Decreased range of motion;Obesity;Decreased activity tolerance;Cardiopulmonary status limiting activity;Impaired balance (sitting and/or standing);Pain      OT Treatment/Interventions: Self-care/ADL training;Therapeutic exercise;Patient/family education;Balance training;Energy conservation;Therapeutic activities;DME and/or AE instruction;Cognitive remediation/compensation    OT Goals(Current goals can be found in the care plan section) Acute Rehab OT Goals Patient Stated Goal: to regain strength and ind OT Goal Formulation: With patient Time For Goal Achievement: 02/10/19 Potential to Achieve Goals: Good  OT Frequency: Min 2X/week   Barriers to D/C:            Co-evaluation PT/OT/SLP Co-Evaluation/Treatment: Yes Reason for Co-Treatment: For patient/therapist safety;To address functional/ADL transfers PT goals addressed during session: Mobility/safety with mobility;Proper use of DME;Balance OT goals addressed during session: ADL's and self-care;Strengthening/ROM;Proper use of Adaptive equipment and DME      AM-PAC OT "6 Clicks" Daily Activity     Outcome Measure  Help from another person eating meals?: None Help from another person taking care of personal grooming?: None Help from another person toileting, which includes using toliet, bedpan, or urinal?: A Lot Help from another person bathing (including washing, rinsing, drying)?: A Lot Help from another person to put on and taking off regular upper body clothing?: A Little Help from another person to put on and taking off regular lower body clothing?: A Lot 6 Click Score: 17   End of Session Equipment Utilized During Treatment: Gait belt;Rolling walker Nurse Communication: Mobility status  Activity Tolerance: Patient tolerated treatment well Patient left: in chair;with call bell/phone within reach;with chair alarm set  OT Visit Diagnosis: Unsteadiness on feet (R26.81);Other abnormalities of gait and mobility (R26.89);Muscle weakness (generalized) (M62.81);Dizziness and giddiness (R42);Pain Pain - Right/Left: Right Pain - part of body: Shoulder                Time: 6226-3335 OT Time Calculation (min): 63 min Charges:  OT General Charges $OT Visit: 1 Visit OT Evaluation $OT Eval Moderate Complexity: 1 Mod OT Treatments $Self Care/Home Management : 23-37 mins  Zenovia Jarred, MSOT, OTR/L Behavioral Health OT/ Acute Relief OT [facility] Office: [place]: [place] 01/27/2019, 1:38 PM

## 2019-01-27 NOTE — Progress Notes (Addendum)
Inpatient Diabetes Program Recommendations  AACE/ADA: New Consensus Statement on Inpatient Glycemic Control (2015)  Target Ranges:  Prepandial:   less than 140 mg/dL      Peak postprandial:   less than 180 mg/dL (1-2 hours)      Critically ill patients:  140 - 180 mg/dL   Results for [name], [name] (MRN [id number]) as of 01/27/2019 07:56  Ref. Range 01/26/2019 07:47 01/26/2019 11:39 01/26/2019 16:30 01/26/2019 21:41  Glucose-Capillary Latest Ref Range: 70 - 99 mg/dL 346 (H)  15 units NOVOLOG given at 10:47am 305 (H)  21 units NOVOLOG given at 1pm +  30 units LANTUS given at 10:47am 292 (H)  17 units NOVOLOG  211 (H)  2 units NOVOLOG    Results for [name], [name] (MRN [id number]) as of 01/27/2019 07:56  Ref. Range 03/11/2018 15:57 07/17/2018 11:41 12/20/2018 13:56  Hemoglobin A1C Latest Ref Range: 4.0 - 5.6 % 10.2 (H) 8.3 (H) 11.7 (A)    Admit with: COVID 19  History: DM, CHF, CKD  Home DM Meds: Toujeo 40 units Daily       Novolog 12 units TID with meals  Current Orders: Novolog Resistant Correction Scale/ SSI (0-20 units) TID AC + HS     Novolog 6 units TID with meals     Lantus 30 units Daily     PCP: [name], NP  ENDO: Dr. Kelton Pillar with [facility]--Last seen 12/20/2018--Was instructed to Continue her Toujeo 40 units Daily and Increase her Novolog to 14 units TID--Patient was also supposed to be using a Correction Factor of 1 unit for every 30 mg/dl >150 mg/dl with her Novolog as well.    Getting Decadron 6 mg Daily.    MD- Note patient taking much larger dose of Novolog at home with her meals.  Also taking more basal insulin at home.  Please consider the following adjustments:  1. Increase Lantus to 40 units Daily (full home dose)  2. Increase Novolog Meal Coverage to: Novolog 14 units TID with meals (home dose that was suggested by her ENDO during August MD visit)     Addendum 3pm- Called pt's daughter, [name] to discuss home DM  care regimen.  Pt's daughter told me that she helps her Mom with all her injections and that patient is taking Toujeo 40 units Daily + Novolog 14 units TID with meals.  Pt has a Freestyle Libre 14 day CGM that pt's daughter scans at least TID before meals at home to get CBG readings.  Discussed with pt's daughter that pt's A1c back in August was elevated to 11.7% and that is likely why pt's ENDO increased pt's home dose of Novolog.  Explained what an A1c is and what goal A1c pt likely should have for healthy CBG control.  Discussed w/ pt's daughter that pt should have another A1c retested in November to see how her sugars have been since August.  Pt's dtr told me pt has follow up appt with the ENDO in November.  Pt's dtr did not have any questions for me at this time.    --Will follow patient during hospitalization--  Wyn Quaker RN, MSN, CDE Diabetes Coordinator Inpatient Glycemic Control Team Team Pager: [phone] (8a-5p)

## 2019-01-27 NOTE — Progress Notes (Signed)
Rehab Admissions Coordinator Note:  Patient was screened by Michel Santee for appropriateness for an Inpatient Acute Rehab Consult.  Note pt's most recent COVID positive test on 9/19.  Per CIR guidelines, pt will need to have either 2 negative tests OR be >20 days since onset of symptoms and recovered/improved, prior to admission to CIR.  Will continue to follow at a distance before requesting CIR consult.    Michel Santee 01/27/2019, 10:48 AM  I can be reached at [phone].

## 2019-01-27 NOTE — Evaluation (Addendum)
Physical Therapy Evaluation Patient Details Name: [name] MRN: [id number] DOB: 1934/06/05 Today's Date: 01/27/2019   History of Present Illness  84 y.o. female with medical history significant of memory loss, hypertension, hyperlipidemia, CHF, insulin-dependent diabetes mellitus, CAD, depression, anxiety, chronic pain syndrome, GERD, and morbid obesity; who presented 01/25/19 with complaints of chest pain and shortness of breath for the last 3-4 days. At baseline patient is wheelchair-bound, but usually able to transition on her own.  She reports going down to [place] to visit family approximately 2 weeks ago where someone had found to be positive for COVID-19.   Clinical Impression   Pt admitted with above diagnosis. Patient's daughter lives with her and normally does not have to help her get OOB to [facility] or to wheelchair (per pt; ?accuracy). She did transfer bed to recliner with min assist to stabilize walker. Feel she could tolerate inpatient rehab when ready to discharge from acute. Pt currently with functional limitations due to the deficits listed below (see PT Problem List). Pt will benefit from skilled PT to increase their independence and safety with mobility to allow discharge to the venue listed below.    Of note, pt reported having dizziness and a "drunk headache." She denied sense of spinning but with ?sense of rocking (pt having difficulty describing). She reports she has had this type of dizziness for months. Will continue to monitor if vestibular evaluation is warranted. She was not orthostatic.     Follow Up Recommendations CIR    Equipment Recommendations  None recommended by PT    Recommendations for Other Services Rehab consult     Precautions / Restrictions Precautions Precautions: Fall;Other (comment) Precaution Comments: dizziness "drunk" feeling; sometimes worse than others Restrictions Weight Bearing Restrictions: No      Mobility  Bed Mobility Overal  bed mobility: Needs Assistance Bed Mobility: Supine to Sit     Supine to sit: Supervision     General bed mobility comments: HOB flat, no rail; exit to rt as at home; incrtime and effort; supervision for safety due to dizziness  Transfers Overall transfer level: Needs assistance Equipment used: Standard walker Transfers: Sit to/from Stand;Stand Pivot Transfers Sit to Stand: Min assist;From elevated surface Stand pivot transfers: Min guard;+2 safety/equipment       General transfer comment: bed elevated 2-3"; pt pushed off bedrail; did not want assist except to anchor walker; close-guarding for safety  Ambulation/Gait             General Gait Details: pt uses w/c for locomotion; uses BSC at bedside  Stairs            Wheelchair Mobility    Modified Rankin (Stroke Patients Only)       Balance Overall balance assessment: Needs assistance Sitting-balance support: Feet supported;No upper extremity supported Sitting balance-Leahy Scale: Poor Sitting balance - Comments: initially with posterior lean; progressed to holding in neutral    Standing balance support: Bilateral upper extremity supported Standing balance-Leahy Scale: Poor Standing balance comment: needs bil UE support                             Pertinent Vitals/Pain Pain Assessment: Faces Faces Pain Scale: Hurts even more Pain Location: rt shoulder, with reaching Pain Descriptors / Indicators: Discomfort;Sore Pain Intervention(s): Limited activity within patient's tolerance;Monitored during session    [place] expects to be discharged to:: Private residence Living Arrangements: Children(dtr lives with her) Available Help at  Discharge: Family;Available 24 hours/day Type of Home: House Home Access: Level entry     Home Layout: Two level;Able to live on main level with bedroom/bathroom Home Equipment: Shower seat - built in;Bedside commode;Wheelchair - [employer] -  2 wheels;Grab bars - tub/shower;Walker - 4 wheels      Prior Function Level of Independence: Needs assistance   Gait / Transfers Assistance Needed: use walker to pivot to w/c; uses manual w/c that someone pushes for her           Hand Dominance   Dominant Hand: Right    Extremity/Trunk Assessment   Upper Extremity Assessment Upper Extremity Assessment: Defer to OT evaluation    Lower Extremity Assessment Lower Extremity Assessment: Generalized weakness;RLE deficits/detail;LLE deficits/detail RLE Deficits / Details: AROM WFL; grossly 3+-4/5 LLE Deficits / Details: AROM WFL; grossly 3+-4/5    Cervical / Trunk Assessment Cervical / Trunk Assessment: Other exceptions Cervical / Trunk Exceptions: morbid obesity; pear shape  Communication   Communication: HOH  Cognition Arousal/Alertness: Awake/alert Behavior During Therapy: WFL for tasks assessed/performed Overall Cognitive Status: No family/caregiver present to determine baseline cognitive functioning Area of Impairment: Memory                     Memory: Decreased short-term memory         General Comments: changes her answers frequently then, I'm not sure      General Comments      Exercises     Assessment/Plan    PT Assessment Patient needs continued PT services  PT Problem List Decreased strength;Decreased balance;Decreased mobility;Decreased cognition;Decreased knowledge of use of DME;Obesity;Pain       PT Treatment Interventions DME instruction;Functional mobility training;Therapeutic activities;Therapeutic exercise;Balance training;Cognitive remediation;Patient/family education;Wheelchair mobility training    PT Goals (Current goals can be found in the Care Plan section)  Acute Rehab PT Goals Patient Stated Goal: to go home and finish getting stronger; but agrees to post-acute therapy as she realizes she is too weak to go home right now PT Goal Formulation: With patient Time For Goal  Achievement: 02/10/19 Potential to Achieve Goals: Good    Frequency Min 3X/week   Barriers to discharge        Co-evaluation PT/OT/SLP Co-Evaluation/Treatment: Yes Reason for Co-Treatment: For patient/therapist safety;To address functional/ADL transfers(morbidly obese; has not been OOB in "weeks") PT goals addressed during session: Mobility/safety with mobility;Proper use of DME;Balance         AM-PAC PT "6 Clicks" Mobility  Outcome Measure Help needed turning from your back to your side while in a flat bed without using bedrails?: A Little Help needed moving from lying on your back to sitting on the side of a flat bed without using bedrails?: A Little Help needed moving to and from a bed to a chair (including a wheelchair)?: A Little Help needed standing up from a chair using your arms (e.g., wheelchair or bedside chair)?: A Little Help needed to walk in hospital room?: Total Help needed climbing 3-5 steps with a railing? : Total 6 Click Score: 14    End of Session Equipment Utilized During Treatment: Gait belt Activity Tolerance: Patient tolerated treatment well Patient left: in chair;with call bell/phone within reach;with chair alarm set Nurse Communication: Mobility status;Other (comment)(on chair alarm; missing cord to ring with call bell system) PT Visit Diagnosis: Muscle weakness (generalized) (M62.81);Dizziness and giddiness (R42)    Time: 8786-7672 PT Time Calculation (min) (ACUTE ONLY): 53 min   Charges:   PT Evaluation $PT Eval  Moderate Complexity: 1 Mod PT Treatments $Therapeutic Activity: 8-22 mins          [name], PT      Rexanne Mano 01/27/2019, 10:40 AM

## 2019-01-27 NOTE — Progress Notes (Signed)
PROGRESS NOTE  [name]:[id number] DOB: 1935/03/04 DOA: 01/24/2019  PCP: [name], NP  Brief History/Interval Summary: 84 y.o. female with medical history significant of hypertension, hyperlipidemia, chronic diastolic CHF, insulin-dependent diabetes mellitus, CAD, depression, anxiety, chronic pain syndrome, GERD, and morbid obesity; who presented with complaints of chest pain and shortness of breath for the last 3-4 days.  At baseline patient is wheelchair-bound, but usually able to transition on her own.  She reports going down to [place] to visit family approximately 2 weeks ago where someone had found to be positive for COVID-19.  COVID-19 screening was positive. Chest x-ray revealed bilateral hazy opacities concerning for atelectasis versus infection.    Patient was admitted to [facility].  Reason for Visit: Pneumonia due to COVID-19  Consultants: None  Procedures: None  Antibiotics: Anti-infectives (From admission, onward)   Start     Dose/Rate Route Frequency Ordered Stop   01/27/19 1000  remdesivir 100 mg in sodium chloride 0.9 % 250 mL IVPB     100 mg 500 mL/hr over 30 Minutes Intravenous Every 24 hours 01/26/19 1127 01/31/19 0959   01/26/19 1300  remdesivir 200 mg in sodium chloride 0.9 % 250 mL IVPB     200 mg 500 mL/hr over 30 Minutes Intravenous Once 01/26/19 1127 01/26/19 1337   01/26/19 1000  azithromycin (ZITHROMAX) 500 mg in sodium chloride 0.9 % 250 mL IVPB  Status:  Discontinued     500 mg 250 mL/hr over 60 Minutes Intravenous Every 24 hours 01/25/19 1235 01/25/19 1641   01/26/19 0900  cefTRIAXone (ROCEPHIN) 2 g in sodium chloride 0.9 % 100 mL IVPB  Status:  Discontinued     2 g 200 mL/hr over 30 Minutes Intravenous Every 24 hours 01/25/19 1235 01/25/19 1641   01/25/19 0830  cefTRIAXone (ROCEPHIN) 2 g in sodium chloride 0.9 % 100 mL IVPB     2 g 200 mL/hr over 30 Minutes Intravenous  Once 01/25/19 0828 01/25/19 0949   01/25/19 0830   azithromycin (ZITHROMAX) 500 mg in sodium chloride 0.9 % 250 mL IVPB     500 mg 250 mL/hr over 60 Minutes Intravenous  Once 01/25/19 0828 01/25/19 1113       Subjective/Interval History: Patient states that she is feeling slightly better.  Still having cough occasional shortness of breath.  No chest pain.  No nausea vomiting.      Assessment/Plan:  Pneumonia due to COVID-19  COVID-19 Labs  Recent Labs    01/25/19 1324 01/26/19 0110 01/27/19 0322  DDIMER 1.19* 0.97* 0.75*  FERRITIN 222 214 188  LDH 179  --   --   CRP 9.8* 10.4* 6.8*    Lab Results  Component Value Date   SARSCOV2NAA POSITIVE (A) 01/25/2019   SARSCOV2NAA NOT DETECTED 10/19/2018     Fever: Remains afebrile Oxygen requirements: Room air.  Saturating in the early 90s. Antibacterials: She did receive ceftriaxone and azithromycin initially but were discontinued. Remdesivir: Day 2 today Steroids: Dexamethasone 6 mg daily Diuretics: None currently Actemra: Not indicated currently Vitamin C and Zinc: Continue DVT Prophylaxis: On anticoagulation with rivaroxaban  Patient remains stable from a respiratory standpoint.  She is saturating in the early 90s.  Continue with Remdesivir and steroids.  Procalcitonin was less than 0.1.  Antibacterial agents were discontinued.  CRP is improved to 6.8.  D-dimer 0.75.  PT and OT evaluation.  Incentive spirometry prone positioning and mobilization.   Chest pain in the setting of coronary  artery disease status post PCI Patient was experiencing chest pain at the time of admission.  EKG did not show any significant changes.  Troponin was normal.  Most likely due to the pneumonia.  Could have been due to stable angina as well.  Remains chest pain-free.  Continue to monitor.  Continue home medications.  She had an MI in 2004 and had a bare-metal stent placed to the RCA and then again in 2012 for unstable angina.  Essential hypertension Blood pressure noted to be elevated at  times.  Continue her antihypertensive medications.  Monitor closely.  Steroids could be contributing to her high blood pressures.    Chronic kidney disease stage III/hyperkalemia Baseline creatinine 1.5-1.8.  Renal function is at baseline.  Monitor urine output.  Potassium again noted to be elevated this morning.  Reason for this is not entirely clear.  She is not on any his potassium supplements.  Not on any other medication that can raise the potassium levels.  We will give her another dose of Lokelma.  Recheck her labs later today.    Chronic diastolic CHF Does not appear to be fluid overloaded.  Monitor closely.  Monitor volume status closely.  Holding her diuretics.  Insulin-dependent diabetes mellitus, uncontrolled with hyperglycemia HbA1c 11.7 in August.  Currently on Lantus insulin.  Worsening glycemic control probably due to steroids.  SSI.  Increase Lantus dose today.  History of COPD with chronic bronchitis No wheezing currently.  Stable.  Continue inhalers.  Anemia of chronic disease Baseline hemoglobin 9-10.  Mild drop in hemoglobin likely due to blood draws and dilution.  Continue to monitor.  No evidence of overt bleeding.  Hemoglobin remained stable.  Chronic pain syndrome Continue home medications including oxycodone.  Hyponatremia Stable.  Continue to monitor.  Probably multifactorial.  History of anxiety and depression Stable.  Continue Cymbalta.  Mild dementia Continue Namenda.  Hyperlipidemia Continue statin.  History of DVT and PE on chronic anticoagulation Continue rivaroxaban.  Morbid obesity Estimated body mass index is 52.59 kg/m as calculated from the following:   Height as of this encounter: 5\' 6"  (1.676 m).   Weight as of this encounter: 147.8 kg.   DVT Prophylaxis: On rivaroxaban PUD Prophylaxis: Pepcid Code Status: DNR Family Communication: Daughter was updated yesterday Disposition Plan: Came from home.  Hopefully she will be able to go  back to that setting.  PT and OT evaluation.   Medications:  Scheduled: . anastrozole  1 mg Oral Daily  . dexamethasone (DECADRON) injection  6 mg Intravenous Q24H  . DULoxetine  60 mg Oral Daily  . famotidine  20 mg Oral Daily  . febuxostat  40 mg Oral Daily  . hydrALAZINE  50 mg Oral BID  . insulin aspart  0-20 Units Subcutaneous TID WC  . insulin aspart  0-5 Units Subcutaneous QHS  . insulin aspart  6 Units Subcutaneous TID WC  . insulin glargine  30 Units Subcutaneous Daily  . Ipratropium-Albuterol  1 puff Inhalation Q6H  . isosorbide mononitrate  60 mg Oral Daily  . magnesium oxide  400 mg Oral Daily  . memantine  10 mg Oral q morning - 10a  . memantine  5 mg Oral QHS  . metoprolol succinate  25 mg Oral Daily  . mometasone-formoterol  2 puff Inhalation BID  . polyethylene glycol  17 g Oral Daily  . pravastatin  20 mg Oral Daily  . rivaroxaban  10 mg Oral Daily  . sodium chloride flush  3  mL Intravenous Once  . vitamin C  500 mg Oral Daily  . zinc sulfate  220 mg Oral Daily   Continuous: . remdesivir 100 mg in NS 250 mL     EQA:[id number], chlorpheniramine-HYDROcodone, clonazePAM, guaiFENesin-dextromethorphan, meclizine, nitroGLYCERIN, ondansetron **OR** ondansetron (ZOFRAN) IV, oxyCODONE-acetaminophen **AND** oxyCODONE   Objective:  Vital Signs  Vitals:   01/26/19 2045 01/27/19 0400 01/27/19 0515 01/27/19 0720  BP: (!) 141/68 (!) 158/65  (!) 151/59  Pulse: 75 62 62 68  Resp: 14 19 18 16   Temp:  98 F (36.7 C)  98 F (36.7 C)  TempSrc:  Oral  Oral  SpO2: 96% 92% 94% 95%  Weight:  (!) 147.8 kg    Height:        Intake/Output Summary (Last 24 hours) at 01/27/2019 1009 Last data filed at 01/26/2019 2100 Gross per 24 hour  Intake 1200 ml  Output 600 ml  Net 600 ml   Filed Weights   01/25/19 1232 01/26/19 0234 01/27/19 0400  Weight: (!) 149.7 kg (!) 149.7 kg (!) 147.8 kg    General appearance: Awake alert.  In no distress.  Morbidly obese Resp: Coarse  breath sounds bilaterally.  Normal effort at rest.  Crackles at the bases.  No wheezing or rhonchi.   Cardio: S1-S2 is normal regular.  No S3-S4.  No rubs murmurs or bruit GI: Abdomen is soft.  Nontender nondistended.  Bowel sounds are present normal.  No masses organomegaly Extremities: No edema.  Able to raise both her legs Neurologic:No focal neurological deficits.     Lab Results:  Data Reviewed: I have personally reviewed following labs and imaging studies  CBC: Recent Labs  Lab 01/24/19 2042 01/26/19 0110 01/27/19 0322  WBC 6.0 6.1 9.1  NEUTROABS  --  5.0 7.7  HGB 9.8* 8.8* 8.6*  HCT 31.5* 28.8* 27.5*  MCV 92.4 93.2 92.0  PLT 212 208 921    Basic Metabolic Panel: Recent Labs  Lab 01/24/19 2042 01/26/19 0110 01/26/19 1320 01/27/19 0322  NA 132* 132*  --  134*  K 4.5 5.7* 5.1 5.8*  CL 98 100  --  102  CO2 23 22  --  22  GLUCOSE 257* 368*  --  262*  BUN 33* 38*  --  49*  CREATININE 1.71* 1.65*  --  1.55*  CALCIUM 8.3* 8.4*  --  8.7*    GFR: Estimated Creatinine Clearance: 41.8 mL/min (A) (by C-G formula based on SCr of 1.55 mg/dL (H)).  Liver Function Tests: Recent Labs  Lab 01/25/19 1324 01/26/19 0110 01/27/19 0322  AST 22 20 17   ALT 17 15 14   ALKPHOS 71 67 59  BILITOT 0.4 0.2* 0.5  PROT 7.8 7.3 7.2  ALBUMIN 2.6* 2.7* 2.8*    CBG: Recent Labs  Lab 01/26/19 0747 01/26/19 1139 01/26/19 1630 01/26/19 2141 01/27/19 0719  GLUCAP 346* 305* 292* 211* 267*    Anemia Panel: Recent Labs    01/26/19 0110 01/27/19 0322  FERRITIN 214 188    Recent Results (from the past 240 hour(s))  SARS Coronavirus 2 [facility] order, Performed in [facility] hospital lab) Nasopharyngeal Nasopharyngeal Swab     Status: Abnormal   Collection Time: 01/25/19  8:50 AM   Specimen: Nasopharyngeal Swab  Result Value Ref Range Status   SARS Coronavirus 2 POSITIVE (A) NEGATIVE Final    Comment: RESULT CALLED TO, READ BACK BY AND VERIFIED WITH: H. MOON RN, AT 1018  01/25/19 BY D. VANHOOK (NOTE) If result is  NEGATIVE SARS-CoV-2 target nucleic acids are NOT DETECTED. The SARS-CoV-2 RNA is generally detectable in upper and lower  respiratory specimens during the acute phase of infection. The lowest  concentration of SARS-CoV-2 viral copies this assay can detect is 250  copies / mL. A negative result does not preclude SARS-CoV-2 infection  and should not be used as the sole basis for treatment or other  patient management decisions.  A negative result may occur with  improper specimen collection / handling, submission of specimen other  than nasopharyngeal swab, presence of viral mutation(s) within the  areas targeted by this assay, and inadequate number of viral copies  (<250 copies / mL). A negative result must be combined with clinical  observations, patient history, and epidemiological information. If result is POSITIVE SARS-CoV-2 target nucleic acids are DETECT ED. The SARS-CoV-2 RNA is generally detectable in upper and lower  respiratory specimens during the acute phase of infection.  Positive  results are indicative of active infection with SARS-CoV-2.  Clinical  correlation with patient history and other diagnostic information is  necessary to determine patient infection status.  Positive results do  not rule out bacterial infection or co-infection with other viruses. If result is PRESUMPTIVE POSTIVE SARS-CoV-2 nucleic acids MAY BE PRESENT.   A presumptive positive result was obtained on the submitted specimen  and confirmed on repeat testing.  While 2019 novel coronavirus  (SARS-CoV-2) nucleic acids may be present in the submitted sample  additional confirmatory testing may be necessary for epidemiological  and / or clinical management purposes  to differentiate between  SARS-CoV-2 and other Sarbecovirus currently known to infect humans.  If clinically indicated additional testing with an alternate test  methodology (LAB74 [id number]) is advised.  The SARS-CoV-2 RNA is generally  detectable in upper and lower respiratory specimens during the acute  phase of infection. The expected result is Negative. Fact Sheet for Patients:  StrictlyIdeas.no Fact Sheet for Healthcare Providers: BankingDealers.co.za This test is not yet approved or cleared by the [place] FDA and has been authorized for detection and/or diagnosis of SARS-CoV-2 by FDA under an Emergency Use Authorization (EUA).  This EUA will remain in effect (meaning this test can be used) for the duration of the COVID-19 declaration under Section 564(b)(1) of the Act, 21 U.S.C. section 360bbb-3(b)(1), unless the authorization is terminated or revoked sooner. Performed at [facility], [place] [address], [place], [place] [address]   Culture, blood (Routine X 2) w Reflex to ID Panel     Status: None (Preliminary result)   Collection Time: 01/25/19  9:25 AM   Specimen: BLOOD LEFT HAND  Result Value Ref Range Status   Specimen Description BLOOD LEFT HAND  Final   Special Requests   Final    BOTTLES DRAWN AEROBIC ONLY Blood Culture results may not be optimal due to an inadequate volume of blood received in culture bottles   Culture   Final    NO GROWTH 2 DAYS Performed at [facility], [place] [address], [place], [place] [address]    Report Status PENDING  Incomplete  Culture, blood (Routine X 2) w Reflex to ID Panel     Status: None (Preliminary result)   Collection Time: 01/25/19  9:25 AM   Specimen: BLOOD  Result Value Ref Range Status   Specimen Description BLOOD LEFT ANTECUBITAL  Final   Special Requests   Final    BOTTLES DRAWN AEROBIC AND ANAEROBIC Blood Culture results may not be optimal due  to an excessive volume of blood received in culture bottles   Culture   Final    NO GROWTH 2 DAYS Performed at [facility], [place] [address] [place], [place] [address]    Report Status PENDING  Incomplete       Radiology Studies: No results found.     LOS: 2 days   [facility] Pager on [email or web address]  01/27/2019, 10:09 AM

## 2019-01-28 ENCOUNTER — Ambulatory Visit: Payer: PPO | Admitting: Nurse Practitioner

## 2019-01-28 ENCOUNTER — Encounter (HOSPITAL_COMMUNITY): Payer: PPO

## 2019-01-28 LAB — FERRITIN: Ferritin: 175 ng/mL (ref 11–307)

## 2019-01-28 LAB — CBC WITH DIFFERENTIAL/PLATELET
Abs Immature Granulocytes: 0.04 10*3/uL (ref 0.00–0.07)
Basophils Absolute: 0 10*3/uL (ref 0.0–0.1)
Basophils Relative: 0 %
Eosinophils Absolute: 0 10*3/uL (ref 0.0–0.5)
Eosinophils Relative: 0 %
HCT: 28 % — ABNORMAL LOW (ref 36.0–46.0)
Hemoglobin: 8.8 g/dL — ABNORMAL LOW (ref 12.0–15.0)
Immature Granulocytes: 0 %
Lymphocytes Relative: 14 %
Lymphs Abs: 1.3 10*3/uL (ref 0.7–4.0)
MCH: 28.6 pg (ref 26.0–34.0)
MCHC: 31.4 g/dL (ref 30.0–36.0)
MCV: 90.9 fL (ref 80.0–100.0)
Monocytes Absolute: 0.4 10*3/uL (ref 0.1–1.0)
Monocytes Relative: 4 %
Neutro Abs: 7.8 10*3/uL — ABNORMAL HIGH (ref 1.7–7.7)
Neutrophils Relative %: 82 %
Platelets: 265 10*3/uL (ref 150–400)
RBC: 3.08 MIL/uL — ABNORMAL LOW (ref 3.87–5.11)
RDW: 13.8 % (ref 11.5–15.5)
WBC: 9.5 10*3/uL (ref 4.0–10.5)
nRBC: 0 % (ref 0.0–0.2)

## 2019-01-28 LAB — HEPATITIS B SURFACE ANTIGEN: Hepatitis B Surface Ag: NEGATIVE

## 2019-01-28 LAB — COMPREHENSIVE METABOLIC PANEL
ALT: 14 U/L (ref 0–44)
AST: 19 U/L (ref 15–41)
Albumin: 2.8 g/dL — ABNORMAL LOW (ref 3.5–5.0)
Alkaline Phosphatase: 57 U/L (ref 38–126)
Anion gap: 10 (ref 5–15)
BUN: 52 mg/dL — ABNORMAL HIGH (ref 8–23)
CO2: 22 mmol/L (ref 22–32)
Calcium: 8.7 mg/dL — ABNORMAL LOW (ref 8.9–10.3)
Chloride: 102 mmol/L (ref 98–111)
Creatinine, Ser: 1.38 mg/dL — ABNORMAL HIGH (ref 0.44–1.00)
GFR calc Af Amer: 41 mL/min — ABNORMAL LOW (ref >60)
GFR calc non Af Amer: 36 mL/min — ABNORMAL LOW (ref >60)
Glucose, Bld: 225 mg/dL — ABNORMAL HIGH (ref 70–99)
Potassium: 5.2 mmol/L — ABNORMAL HIGH (ref 3.5–5.1)
Sodium: 134 mmol/L — ABNORMAL LOW (ref 135–145)
Total Bilirubin: 0.5 mg/dL (ref 0.3–1.2)
Total Protein: 7.4 g/dL (ref 6.5–8.1)

## 2019-01-28 LAB — GLUCOSE, CAPILLARY
Glucose-Capillary: 251 mg/dL — ABNORMAL HIGH (ref 70–99)
Glucose-Capillary: 287 mg/dL — ABNORMAL HIGH (ref 70–99)
Glucose-Capillary: 385 mg/dL — ABNORMAL HIGH (ref 70–99)
Glucose-Capillary: 432 mg/dL — ABNORMAL HIGH (ref 70–99)
Glucose-Capillary: 380 mg/dL — ABNORMAL HIGH (ref 70–99)

## 2019-01-28 LAB — D-DIMER, QUANTITATIVE: D-Dimer, Quant: 0.53 ug/mL-FEU — ABNORMAL HIGH (ref 0.00–0.50)

## 2019-01-28 LAB — C-REACTIVE PROTEIN: CRP: 4 mg/dL — ABNORMAL HIGH (ref <1.0)

## 2019-01-28 MED ORDER — DEXAMETHASONE 4 MG PO TABS: 2.0000 mg | ORAL_TABLET | Freq: Every day | ORAL | Status: DC

## 2019-01-28 MED ORDER — DEXAMETHASONE 4 MG PO TABS
4.0000 mg | ORAL_TABLET | Freq: Every day | ORAL | Status: DC
  Administered 2019-01-28 – 2019-01-29 (×2): 4 mg via ORAL
  Filled 2019-01-28 (×2): qty 1

## 2019-01-28 MED ORDER — SODIUM ZIRCONIUM CYCLOSILICATE 10 G PO PACK
10.0000 g | PACK | Freq: Once | ORAL | Status: AC
  Administered 2019-01-28: 10 g via ORAL
  Filled 2019-01-28: qty 1

## 2019-01-28 MED ORDER — INSULIN GLARGINE 100 UNIT/ML ~~LOC~~ SOLN
10.0000 [IU] | Freq: Once | SUBCUTANEOUS | Status: AC
  Administered 2019-01-28: 10 [IU] via SUBCUTANEOUS
  Filled 2019-01-28: qty 0.1

## 2019-01-28 MED ORDER — INSULIN ASPART 100 UNIT/ML ~~LOC~~ SOLN
6.0000 [IU] | Freq: Once | SUBCUTANEOUS | Status: AC
  Administered 2019-01-28: 6 [IU] via SUBCUTANEOUS

## 2019-01-28 NOTE — Progress Notes (Signed)
PROGRESS NOTE  [name] NKN:[id number] DOB: 12/25/1934 DOA: 01/24/2019  PCP: [name], NP  Brief History/Interval Summary: 84 y.o. female with medical history significant of hypertension, hyperlipidemia, chronic diastolic CHF, insulin-dependent diabetes mellitus, CAD, depression, anxiety, chronic pain syndrome, GERD, and morbid obesity; who presented with complaints of chest pain and shortness of breath for the last 3-4 days.  At baseline patient is wheelchair-bound, but usually able to transition on her own.  She reports going down to [place] to visit family approximately 2 weeks ago where someone had found to be positive for COVID-19.  COVID-19 screening was positive. Chest x-ray revealed bilateral hazy opacities concerning for atelectasis versus infection.    Patient was admitted to [facility].  Reason for Visit: Pneumonia due to COVID-19  Consultants: None  Procedures: None  Antibiotics: Anti-infectives (From admission, onward)   Start     Dose/Rate Route Frequency Ordered Stop   01/27/19 1000  remdesivir 100 mg in sodium chloride 0.9 % 250 mL IVPB     100 mg 500 mL/hr over 30 Minutes Intravenous Every 24 hours 01/26/19 1127 01/31/19 0959   01/26/19 1300  remdesivir 200 mg in sodium chloride 0.9 % 250 mL IVPB     200 mg 500 mL/hr over 30 Minutes Intravenous Once 01/26/19 1127 01/26/19 1337   01/26/19 1000  azithromycin (ZITHROMAX) 500 mg in sodium chloride 0.9 % 250 mL IVPB  Status:  Discontinued     500 mg 250 mL/hr over 60 Minutes Intravenous Every 24 hours 01/25/19 1235 01/25/19 1641   01/26/19 0900  cefTRIAXone (ROCEPHIN) 2 g in sodium chloride 0.9 % 100 mL IVPB  Status:  Discontinued     2 g 200 mL/hr over 30 Minutes Intravenous Every 24 hours 01/25/19 1235 01/25/19 1641   01/25/19 0830  cefTRIAXone (ROCEPHIN) 2 g in sodium chloride 0.9 % 100 mL IVPB     2 g 200 mL/hr over 30 Minutes Intravenous  Once 01/25/19 0828 01/25/19 0949   01/25/19 0830   azithromycin (ZITHROMAX) 500 mg in sodium chloride 0.9 % 250 mL IVPB     500 mg 250 mL/hr over 60 Minutes Intravenous  Once 01/25/19 0828 01/25/19 1113       Subjective/Interval History: Patient states that she is feeling okay.  Denies any difficulty breathing.  Cough is improving.  No chest pain.  No nausea or vomiting.      Assessment/Plan:  Pneumonia due to COVID-19  COVID-19 Labs  Recent Labs    01/25/19 1324 01/26/19 0110 01/27/19 0322 01/28/19 0220  DDIMER 1.19* 0.97* 0.75* 0.53*  FERRITIN 222 214 188 175  LDH 179  --   --   --   CRP 9.8* 10.4* 6.8* 4.0*    Lab Results  Component Value Date   SARSCOV2NAA POSITIVE (A) 01/25/2019   SARSCOV2NAA NOT DETECTED 10/19/2018     Fever: Remains afebrile Oxygen requirements: On room air.  Saturating in the early 90s. Antibacterials: She did receive ceftriaxone and azithromycin initially but were discontinued. Remdesivir: Day 3 today Steroids: Dexamethasone 6 mg daily Diuretics: None currently Actemra: Not indicated currently Vitamin C and Zinc: Continue DVT Prophylaxis: On anticoagulation with rivaroxaban  Patient remained stable from a respiratory standpoint.  She is not requiring any oxygen currently.  She is saturating in the early 90s.  Continue with Remdesivir and steroids. Procalcitonin was less than 0.1.  Antibacterial agents were discontinued.  Inflammatory markers have improved.  CRP is down to 4.0.  D-dimer  0.53.  Ferritin is normal.  Continue mobilization.  Continue incentive spirometry.  PT and OT recommend CIR. However CIR requires 2 negative COVID-19 tests.  Patient tested positive on 9/19.  Unlikely she will become negative so quickly.  May have to pursue other rehab options.     Chest pain in the setting of coronary artery disease status post PCI Patient was experiencing chest pain at the time of admission.  EKG did not show any significant changes.  Troponin was normal.  Most likely due to the pneumonia.   Could have been due to stable angina as well.  She has been chest pain-free for the last 48 hours.  Continue her home medications. She had an MI in 2004 and had a bare-metal stent placed to the RCA and then again in 2012 for unstable angina.  Essential hypertension Blood pressure elevated at times.  Continue her antihypertensive regimen.  Steroids could be contributing.  Continue to monitor closely.    Chronic kidney disease stage III/hyperkalemia Baseline creatinine 1.5-1.8.  Renal function is at baseline.  Monitor urine output.  Since potassium noted to be elevated on daily basis.  She is getting Lokelma on a daily basis with which potassium does improve.  Could be due to steroids.  She is not on potassium supplements.  She is not noted to be on any other medication which can raise the potassium level.  Potassium 5.2 today.  Dose of Lokelma to be given today.  We will recheck labs tomorrow.    Chronic diastolic CHF Does not appear to be fluid overloaded.  Monitor closely.  Monitor volume status closely.  Holding her diuretics.  Insulin-dependent diabetes mellitus, uncontrolled with hyperglycemia HbA1c 11.7 in August.  Currently on Lantus insulin.  Worsening glycemic control most likely due to steroids.  Since her respiratory status has improved we will start tapering her steroids.  This should provide some improvement in her glycemic control.   History of COPD with chronic bronchitis No wheezing currently.  Stable.  Continue inhalers.  Anemia of chronic disease Baseline hemoglobin 9-10.  Mild drop in hemoglobin likely due to blood draws and dilution.  Continue to monitor.  No evidence of overt bleeding.  Hemoglobin remains stable.    Chronic pain syndrome Continue home medications including oxycodone.  Hyponatremia Stable.  Continue to monitor.  Probably multifactorial.  History of anxiety and depression Stable.  Continue Cymbalta.  Mild dementia Continue Namenda.  Hyperlipidemia  Continue statin.  History of DVT and PE on chronic anticoagulation Continue rivaroxaban.  Morbid obesity Estimated body mass index is 52.09 kg/m as calculated from the following:   Height as of this encounter: 5\' 6"  (1.676 m).   Weight as of this encounter: 146.4 kg.   DVT Prophylaxis: On rivaroxaban PUD Prophylaxis: Pepcid Code Status: DNR Family Communication: Daughter being updated on a daily basis. Disposition Plan: She lives with her daughter.  PT and OT recommending CIR.  Considering that she was positive for COVID-19 only on 9/19 and CIR requires 2 negative test results, it is unlikely that she will test negative so quickly.  May have to pursue other rehab options.     Medications:  Scheduled: . anastrozole  1 mg Oral Daily  . dexamethasone (DECADRON) injection  6 mg Intravenous Q24H  . DULoxetine  60 mg Oral Daily  . famotidine  20 mg Oral Daily  . febuxostat  40 mg Oral Daily  . hydrALAZINE  50 mg Oral BID  . insulin aspart  0-20  Units Subcutaneous TID WC  . insulin aspart  0-5 Units Subcutaneous QHS  . insulin aspart  6 Units Subcutaneous TID WC  . insulin glargine  40 Units Subcutaneous Daily  . Ipratropium-Albuterol  1 puff Inhalation Q6H  . isosorbide mononitrate  60 mg Oral Daily  . magnesium oxide  400 mg Oral Daily  . memantine  10 mg Oral q morning - 10a  . memantine  5 mg Oral QHS  . metoprolol succinate  25 mg Oral Daily  . mometasone-formoterol  2 puff Inhalation BID  . polyethylene glycol  17 g Oral Daily  . pravastatin  20 mg Oral Daily  . rivaroxaban  10 mg Oral Daily  . sodium chloride flush  3 mL Intravenous Once  . vitamin C  500 mg Oral Daily  . zinc sulfate  220 mg Oral Daily   Continuous: . remdesivir 100 mg in NS 250 mL 100 mg (01/28/19 1015)   YIR:SWNIOEVOJJKKX, chlorpheniramine-HYDROcodone, clonazePAM, guaiFENesin-dextromethorphan, meclizine, nitroGLYCERIN, ondansetron **OR** ondansetron (ZOFRAN) IV, oxyCODONE-acetaminophen **AND**  oxyCODONE   Objective:  Vital Signs  Vitals:   01/27/19 2245 01/28/19 0359 01/28/19 0400 01/28/19 0817  BP:   (!) 145/95 (!) 148/62  Pulse: 68  66   Resp: 17  17   Temp:   97.8 F (36.6 C) (!) 97.5 F (36.4 C)  TempSrc:   Oral Oral  SpO2: 96%  94%   Weight:  (!) 146.4 kg    Height:        Intake/Output Summary (Last 24 hours) at 01/28/2019 1042 Last data filed at 01/28/2019 0940 Gross per 24 hour  Intake 780 ml  Output 1300 ml  Net -520 ml   Filed Weights   01/26/19 0234 01/27/19 0400 01/28/19 0359  Weight: (!) 149.7 kg (!) 147.8 kg (!) 146.4 kg    General appearance: Awake alert.  In no distress.  Morbidly obese Resp: Coarse breath sounds bilaterally.  No wheezing or rhonchi.  Few crackles at the bases.   Cardio: S1-S2 is normal regular.  No S3-S4.  No rubs murmurs or bruit GI: Abdomen is soft.  Nontender nondistended.  Bowel sounds are present normal.  No masses organomegaly Extremities: No edema.  Able to move her extremities Neurologic: No focal neurological deficits.      Lab Results:  Data Reviewed: I have personally reviewed following labs and imaging studies  CBC: Recent Labs  Lab 01/24/19 2042 01/26/19 0110 01/27/19 0322 01/28/19 0220  WBC 6.0 6.1 9.1 9.5  NEUTROABS  --  5.0 7.7 7.8*  HGB 9.8* 8.8* 8.6* 8.8*  HCT 31.5* 28.8* 27.5* 28.0*  MCV 92.4 93.2 92.0 90.9  PLT 212 208 231 381    Basic Metabolic Panel: Recent Labs  Lab 01/24/19 2042 01/26/19 0110 01/26/19 1320 01/27/19 0322 01/27/19 1330 01/28/19 0220  NA 132* 132*  --  134*  --  134*  K 4.5 5.7* 5.1 5.8* 4.9 5.2*  CL 98 100  --  102  --  102  CO2 23 22  --  22  --  22  GLUCOSE 257* 368*  --  262*  --  225*  BUN 33* 38*  --  49*  --  52*  CREATININE 1.71* 1.65*  --  1.55*  --  1.38*  CALCIUM 8.3* 8.4*  --  8.7*  --  8.7*    GFR: Estimated Creatinine Clearance: 46.7 mL/min (A) (by C-G formula based on SCr of 1.38 mg/dL (H)).  Liver Function Tests: Recent Labs  Lab  01/25/19 1324 01/26/19 0110 01/27/19 0322 01/28/19 0220  AST 22 20 17 19   ALT 17 15 14 14   ALKPHOS 71 67 59 57  BILITOT 0.4 0.2* 0.5 0.5  PROT 7.8 7.3 7.2 7.4  ALBUMIN 2.6* 2.7* 2.8* 2.8*    CBG: Recent Labs  Lab 01/27/19 0719 01/27/19 1156 01/27/19 1545 01/27/19 2106 01/28/19 0815  GLUCAP 267* 297* 350* 251* 287*    Anemia Panel: Recent Labs    01/27/19 0322 01/28/19 0220  FERRITIN 188 175    Recent Results (from the past 240 hour(s))  SARS Coronavirus 2 [facility] order, Performed in [facility] hospital lab) Nasopharyngeal Nasopharyngeal Swab     Status: Abnormal   Collection Time: 01/25/19  8:50 AM   Specimen: Nasopharyngeal Swab  Result Value Ref Range Status   SARS Coronavirus 2 POSITIVE (A) NEGATIVE Final    Comment: RESULT CALLED TO, READ BACK BY AND VERIFIED WITH: H. MOON RN, AT 6389 01/25/19 BY D. VANHOOK (NOTE) If result is NEGATIVE SARS-CoV-2 target nucleic acids are NOT DETECTED. The SARS-CoV-2 RNA is generally detectable in upper and lower  respiratory specimens during the acute phase of infection. The lowest  concentration of SARS-CoV-2 viral copies this assay can detect is 250  copies / mL. A negative result does not preclude SARS-CoV-2 infection  and should not be used as the sole basis for treatment or other  patient management decisions.  A negative result may occur with  improper specimen collection / handling, submission of specimen other  than nasopharyngeal swab, presence of viral mutation(s) within the  areas targeted by this assay, and inadequate number of viral copies  (<250 copies / mL). A negative result must be combined with clinical  observations, patient history, and epidemiological information. If result is POSITIVE SARS-CoV-2 target nucleic acids are DETECT ED. The SARS-CoV-2 RNA is generally detectable in upper and lower  respiratory specimens during the acute phase of infection.  Positive  results are indicative of active  infection with SARS-CoV-2.  Clinical  correlation with patient history and other diagnostic information is  necessary to determine patient infection status.  Positive results do  not rule out bacterial infection or co-infection with other viruses. If result is PRESUMPTIVE POSTIVE SARS-CoV-2 nucleic acids MAY BE PRESENT.   A presumptive positive result was obtained on the submitted specimen  and confirmed on repeat testing.  While 2019 novel coronavirus  (SARS-CoV-2) nucleic acids may be present in the submitted sample  additional confirmatory testing may be necessary for epidemiological  and / or clinical management purposes  to differentiate between  SARS-CoV-2 and other Sarbecovirus currently known to infect humans.  If clinically indicated additional testing with an alternate test  methodology (LAB74 53) is advised. The SARS-CoV-2 RNA is generally  detectable in upper and lower respiratory specimens during the acute  phase of infection. The expected result is Negative. Fact Sheet for Patients:  StrictlyIdeas.no Fact Sheet for Healthcare Providers: BankingDealers.co.za This test is not yet approved or cleared by the [place] FDA and has been authorized for detection and/or diagnosis of SARS-CoV-2 by FDA under an Emergency Use Authorization (EUA).  This EUA will remain in effect (meaning this test can be used) for the duration of the COVID-19 declaration under Section 564(b)(1) of the Act, 21 U.S.C. section 360bbb-3(b)(1), unless the authorization is terminated or revoked sooner. Performed at [facility], [place] [address], [place], [place] [address]   Culture, blood (Routine X 2) w Reflex to  ID Panel     Status: None (Preliminary result)   Collection Time: 01/25/19  9:25 AM   Specimen: BLOOD LEFT HAND  Result Value Ref Range Status   Specimen Description BLOOD LEFT HAND  Final   Special Requests   Final    BOTTLES DRAWN  AEROBIC ONLY Blood Culture results may not be optimal due to an inadequate volume of blood received in culture bottles   Culture   Final    NO GROWTH 3 DAYS Performed at [facility], [place] [address], [place], [place] [address]    Report Status PENDING  Incomplete  Culture, blood (Routine X 2) w Reflex to ID Panel     Status: None (Preliminary result)   Collection Time: 01/25/19  9:25 AM   Specimen: BLOOD  Result Value Ref Range Status   Specimen Description BLOOD LEFT ANTECUBITAL  Final   Special Requests   Final    BOTTLES DRAWN AEROBIC AND ANAEROBIC Blood Culture results may not be optimal due to an excessive volume of blood received in culture bottles   Culture   Final    NO GROWTH 3 DAYS Performed at [facility], [place] [address], [place], [place] [address]    Report Status PENDING  Incomplete      Radiology Studies: No results found.     LOS: 3 days   Laiylah Roettger [employer] on [email or web address]  01/28/2019, 10:42 AM

## 2019-01-28 NOTE — Evaluation (Addendum)
Physical Therapy Treatment Patient Details Name: [name] MRN: [id number] DOB: [birth year] Today's Date: 01/28/2019    History of Present Illness 84 y.o. female with medical history significant of memory loss, hypertension, hyperlipidemia, CHF, insulin-dependent diabetes mellitus, CAD, depression, anxiety, chronic pain syndrome, GERD, and morbid obesity; who presented 01/25/19 with complaints of chest pain and shortness of breath for the last 3-4 days. At baseline patient is wheelchair-bound, but usually able to transition on her own.  She reports going down to [place] to visit family approximately 2 weeks ago where someone had found to be positive for COVID-19.     PT Comments    Pt is making progress with tx, she is able to get from bed to [facility] and BSC to recliner with min-mod a x 2 for safety. Pt is very motivated to improve and return home. She does tend to get very winded with activit y to sats remain in low to mid 90s on room air. She does still present with decreased strength, independence, safety and activity tolerance, as she reports was living home with daughter and needing very little assistance, CIR is preferred d/c disposition but criteria for admission may not be met at time of d/c. Will continue to educate pt and update d/c disposition.    Follow Up Recommendations  Other (comment)(will need to discuss with pt )     Equipment Recommendations       Recommendations for Other Services Rehab consult     Precautions / Restrictions Precautions Precautions: Fall;Other (comment) Precaution Comments: dizziness "drunk" feeling; sometimes worse than others    Mobility  Bed Mobility Overal bed mobility: Needs Assistance Bed Mobility: Supine to Sit     Supine to sit: Supervision     General bed mobility comments: HOB wityhout rail; increased time and effort needed, close supervision for safety 2/2 dizziness  Transfers   Equipment used: Rolling walker (2  wheeled) Transfers: Risk manager;Sit to/from Stand Sit to Stand: Min guard Stand pivot transfers: Min guard;+2 safety/equipment       General transfer comment: Pt wants to try everything on her own first   Ambulation/Gait Ambulation/Gait assistance: Min assist Gait Distance (Feet): 3 Feet Assistive device: Rolling walker (2 wheeled) Gait Pattern/deviations: Step-through pattern     General Gait Details: very slow cadence   Stairs             Wheelchair Mobility    Modified Rankin (Stroke Patients Only)       Balance Overall balance assessment: Needs assistance Sitting-balance support: No upper extremity supported Sitting balance-Leahy Scale: Fair     Standing balance support: Bilateral upper extremity supported Standing balance-Leahy Scale: Fair                              Cognition Arousal/Alertness: Awake/alert Behavior During Therapy: WFL for tasks assessed/performed Overall Cognitive Status: Within Functional Limits for tasks assessed                       Memory: Decreased short-term memory                Exercises      General Comments        Pertinent Vitals/Pain Pain Assessment: No/denies pain    Home Living                      Prior Function  PT Goals (current goals can now be found in the care plan section) Acute Rehab PT Goals Patient Stated Goal: to regain strength and ind Time For Goal Achievement: 02/10/19 Potential to Achieve Goals: Good Progress towards PT goals: Progressing toward goals    Frequency    Min 3X/week      PT Plan Discharge plan needs to be updated    Co-evaluation     PT goals addressed during session: Mobility/safety with mobility;Balance        AM-PAC PT "6 Clicks" Mobility   Outcome Measure  Help needed turning from your back to your side while in a flat bed without using bedrails?: A Little Help needed moving from lying on your  back to sitting on the side of a flat bed without using bedrails?: A Little Help needed moving to and from a bed to a chair (including a wheelchair)?: A Little Help needed standing up from a chair using your arms (e.g., wheelchair or bedside chair)?: A Little Help needed to walk in hospital room?: A Lot Help needed climbing 3-5 steps with a railing? : Total 6 Click Score: 15    End of Session   Activity Tolerance: Patient limited by lethargy;Treatment limited secondary to medical complications (Comment);Patient limited by fatigue Patient left: in chair;with call bell/phone within reach Nurse Communication: Mobility status;Precautions PT Visit Diagnosis: Muscle weakness (generalized) (M62.81);Dizziness and giddiness (R42)     Time: 7156-6483 PT Time Calculation (min) (ACUTE ONLY): 25 min  Charges:  $Therapeutic Activity: 8-22 mins $Neuromuscular Re-education: 8-22 mins                     Horald Chestnut, PT    Delford Field 01/28/2019, 3:14 PM

## 2019-01-28 NOTE — Progress Notes (Addendum)
Inpatient Diabetes Program Recommendations  AACE/ADA: New Consensus Statement on Inpatient Glycemic Control (2015)  Target Ranges:  Prepandial:   less than 140 mg/dL      Peak postprandial:   less than 180 mg/dL (1-2 hours)      Critically ill patients:  140 - 180 mg/dL   Lab Results  Component Value Date   GLUCAP 287 (H) 01/28/2019   HGBA1C 11.7 (A) 12/20/2018    Review of Glycemic Control Results for [name], [name] (MRN [id number]) as of 01/28/2019 12:40  Ref. Range 01/27/2019 11:56 01/27/2019 15:45 01/27/2019 21:06 01/28/2019 08:15  Glucose-Capillary Latest Ref Range: 70 - 99 mg/dL 297 (H) 350 (H) 251 (H) 287 (H)   Admit with: COVID 19  History: DM, CHF, CKD  Home DM Meds: Toujeo 40 units Daily                             Novolog 12 units TID with meals  Current Orders: Novolog Resistant Correction Scale/ SSI (0-20 units) TID AC + HS                           Novolog 6 units TID with meals                           Lantus 40 units Daily  To begin steroid taper of Decadron 2 mg QD on 01/31/2019    Please consider the following adjustments:  1. Increase Novolog Meal Coverage to: Novolog 14 units TID with meals (home dose that was suggested by her ENDO during August MD visit)  Thanks, Bronson Curb, MSN, RNC-OB Diabetes Coordinator [phone] (8a-5p)

## 2019-01-29 ENCOUNTER — Ambulatory Visit: Payer: PPO | Admitting: Nurse Practitioner

## 2019-01-29 DIAGNOSIS — J988 Other specified respiratory disorders: Secondary | ICD-10-CM

## 2019-01-29 DIAGNOSIS — J9601 Acute respiratory failure with hypoxia: Secondary | ICD-10-CM

## 2019-01-29 LAB — COMPREHENSIVE METABOLIC PANEL
ALT: 14 U/L (ref 0–44)
AST: 18 U/L (ref 15–41)
Albumin: 3 g/dL — ABNORMAL LOW (ref 3.5–5.0)
Alkaline Phosphatase: 63 U/L (ref 38–126)
Anion gap: 11 (ref 5–15)
BUN: 49 mg/dL — ABNORMAL HIGH (ref 8–23)
CO2: 23 mmol/L (ref 22–32)
Calcium: 9 mg/dL (ref 8.9–10.3)
Chloride: 100 mmol/L (ref 98–111)
Creatinine, Ser: 1.39 mg/dL — ABNORMAL HIGH (ref 0.44–1.00)
GFR calc Af Amer: 41 mL/min — ABNORMAL LOW (ref >60)
GFR calc non Af Amer: 35 mL/min — ABNORMAL LOW (ref >60)
Glucose, Bld: 256 mg/dL — ABNORMAL HIGH (ref 70–99)
Potassium: 4.8 mmol/L (ref 3.5–5.1)
Sodium: 134 mmol/L — ABNORMAL LOW (ref 135–145)
Total Bilirubin: 0.2 mg/dL — ABNORMAL LOW (ref 0.3–1.2)
Total Protein: 7.6 g/dL (ref 6.5–8.1)

## 2019-01-29 LAB — CBC WITH DIFFERENTIAL/PLATELET
Abs Immature Granulocytes: 0.05 10*3/uL (ref 0.00–0.07)
Basophils Absolute: 0 10*3/uL (ref 0.0–0.1)
Basophils Relative: 0 %
Eosinophils Absolute: 0 10*3/uL (ref 0.0–0.5)
Eosinophils Relative: 0 %
HCT: 30.1 % — ABNORMAL LOW (ref 36.0–46.0)
Hemoglobin: 9.6 g/dL — ABNORMAL LOW (ref 12.0–15.0)
Immature Granulocytes: 1 %
Lymphocytes Relative: 17 %
Lymphs Abs: 1.5 10*3/uL (ref 0.7–4.0)
MCH: 29.4 pg (ref 26.0–34.0)
MCHC: 31.9 g/dL (ref 30.0–36.0)
MCV: 92.3 fL (ref 80.0–100.0)
Monocytes Absolute: 0.7 10*3/uL (ref 0.1–1.0)
Monocytes Relative: 7 %
Neutro Abs: 6.7 10*3/uL (ref 1.7–7.7)
Neutrophils Relative %: 75 %
Platelets: 335 10*3/uL (ref 150–400)
RBC: 3.26 MIL/uL — ABNORMAL LOW (ref 3.87–5.11)
RDW: 13.6 % (ref 11.5–15.5)
WBC: 8.9 10*3/uL (ref 4.0–10.5)
nRBC: 0 % (ref 0.0–0.2)

## 2019-01-29 LAB — URINALYSIS, ROUTINE W REFLEX MICROSCOPIC
Bilirubin Urine: NEGATIVE
Glucose, UA: >=500 mg/dL — AB
Hgb urine dipstick: NEGATIVE
Ketones, ur: NEGATIVE mg/dL
Nitrite: NEGATIVE
Protein, ur: 30 mg/dL — AB
Specific Gravity, Urine: 1.016 (ref 1.005–1.030)
WBC, UA: >50 WBC/hpf — ABNORMAL HIGH (ref 0–5)
pH: 5 (ref 5.0–8.0)

## 2019-01-29 LAB — FERRITIN: Ferritin: 198 ng/mL (ref 11–307)

## 2019-01-29 LAB — C-REACTIVE PROTEIN: CRP: 1.9 mg/dL — ABNORMAL HIGH (ref <1.0)

## 2019-01-29 LAB — D-DIMER, QUANTITATIVE: D-Dimer, Quant: 0.85 ug/mL-FEU — ABNORMAL HIGH (ref 0.00–0.50)

## 2019-01-29 LAB — GLUCOSE, CAPILLARY
Glucose-Capillary: 409 mg/dL — ABNORMAL HIGH (ref 70–99)
Glucose-Capillary: 359 mg/dL — ABNORMAL HIGH (ref 70–99)
Glucose-Capillary: 407 mg/dL — ABNORMAL HIGH (ref 70–99)

## 2019-01-29 MED ORDER — SODIUM POLYSTYRENE SULFONATE 15 GM/60ML PO SUSP
15.0000 g | ORAL | Status: DC
  Administered 2019-01-30: 11:00:00 15 g via ORAL
  Filled 2019-01-29: qty 60

## 2019-01-29 MED ORDER — FUROSEMIDE 20 MG PO TABS
20.0000 mg | ORAL_TABLET | Freq: Every day | ORAL | Status: DC
  Administered 2019-01-30: 20 mg via ORAL
  Filled 2019-01-29 (×2): qty 1

## 2019-01-29 NOTE — Progress Notes (Signed)
Inpatient Diabetes Program Recommendations  AACE/ADA: New Consensus Statement on Inpatient Glycemic Control (2015)  Target Ranges:  Prepandial:   less than 140 mg/dL      Peak postprandial:   less than 180 mg/dL (1-2 hours)      Critically ill patients:  140 - 180 mg/dL   Lab Results  Component Value Date   GLUCAP 380 (H) 01/28/2019   HGBA1C 11.7 (A) 12/20/2018    Review of Glycemic Control Results for ZENOBIA, KUENNEN (MRN [id number]) as of 01/29/2019 10:08  Ref. Range 01/28/2019 08:15 01/28/2019 12:01 01/28/2019 15:52 01/28/2019 21:10  Glucose-Capillary Latest Ref Range: 70 - 99 mg/dL 287 (H) 385 (H) 432 (H) 380 (H)   Admit with:COVID 19  History:DM, CHF, CKD  Home DM Meds:Toujeo 40 units Daily Novolog 12 units TID with meals  Current Orders:Novolog Resistant Correction Scale/ SSI (0-20 units) TID AC + HS Novolog 6 units TID with meals Lantus 40 units Daily     Lantus 10 units x1     Novolog 6 units x1  To begin steroid taper of Decadron 2 mg QD on 01/31/2019    Please consider the following adjustments:  1. Increase Novolog Meal Coverage to: Novolog 14 units TID with meals (home dose that was suggested by her ENDO during August MD visit) 2. Increase Lantus 55 units QD  Thanks, Bronson Curb, MSN, RNC-OB Diabetes Coordinator [phone] (8a-5p)

## 2019-01-29 NOTE — Progress Notes (Signed)
Daughter called and updated. All questions answered. No other needs at this time.

## 2019-01-29 NOTE — Progress Notes (Signed)
Daughter on the phone with patient at this time.  Spoke with daughter.  No questions at this time.  Patient continues to talk with her daughter. Patient currently resting in bed, awake and alert.  No acute distress present. See chart for nsg assessment.

## 2019-01-29 NOTE — Progress Notes (Signed)
[facility] PROGRESS NOTE    Progress Note  [name]  NKN:[id number] DOB: Apr 29, 1935 DOA: 01/24/2019 PCP: [name], NP     Brief Narrative:   [name] is an 84 y.o. female past medical history significant for essential hypertension, hyperlipidemia, chronic diastolic heart failure, insulin-dependent diabetes mellitus chronic pain syndrome and morbid obesity who presents with chest pain and shortness of breath for 4 days.  Patient is wheelchair-bound, she reported going down today run to visit family approximately 2 weeks ago or when someone was found to be positive for SARS-CoV-2  Assessment/Plan:   Pneumonia due to COVID-19 virus: Has remained afebrile currently on room air satting greater than 90%. She started empirically on antibiotics initially but were discontinued. 01/26/2019 IV Remdesivir and steroids were started, will continue IV Remdesivir and steroids for 5 days. Physical therapy and Occupational Therapy recommended CIR.  It is unlikely that she will become negative, will need to pursue skilled nursing facility temporarily. Consult physical therapy as might need skilled nursing facility.  Chest pain in the setting of coronary artery disease status post PCI: Troponins on admission were 14, EKG did not show signs of ischemia. Was due to her pneumonia.  Essential hypertension: Continue current home antihypertensive regimen. We will restart her home dose of Lasix.  Chronic kidney disease stage III/hyperkalemia: With a baseline creatinine of 1.5-1.8. She relates she gets in a coma on a daily basis, she is on no potassium supplementation. She was started on Lokelma yesterday her potassium is improved  Chronic diastolic heart failure: Does not appear to be fluid overloaded, as her potassium is high we will start her on Lasix.  Insulin-dependent diabetes mellitus type 2 uncontrolled hyperglycemia: With an A1c of 11.7, she is currently on Lantus  and sliding scale, her blood glucose worsened likely due to steroids. As she has not required oxygen will taper down steroids quickly.  History of COPD/chronic bronchitis: No wheezing stable continue inhalers.  Anemia of chronic disease: Hemoglobin stable.  Chronic pain syndrome: Continue current regimen.  Hypovolemic hyponatremia: Likely multifactorial now resolved.  History of anxiety and depression: Stable continue Cymbalta.  History of DVT and PE on Chronic AC: Continue Xarelto  Hyperlipidemia: Continue statins.  Morbid (severe) obesity due to excess calories [facility]): Counseling.  Vaginal discharge: Check a GC and chlamydia  DVT prophylaxis: Xarelto Family Communication: none Disposition Plan/Barrier to D/C: SNF in am Code Status:     Code Status Orders  (From admission, onward)         Start     Ordered   01/25/19 1223  Do not attempt resuscitation (DNR)  Continuous    Question Answer Comment  In the event of cardiac or respiratory ARREST Do not call a "code blue"   In the event of cardiac or respiratory ARREST Do not perform Intubation, CPR, defibrillation or ACLS   In the event of cardiac or respiratory ARREST Use medication by any route, position, wound care, and other measures to relive pain and suffering. May use oxygen, suction and manual treatment of airway obstruction as needed for comfort.      01/25/19 1222        Code Status History    Date Active Date Inactive Code Status Order ID Comments User Context   10/08/2017 2356 10/10/2017 1600 DNR [id number]  Toy Baker, MD Inpatient   07/27/2017 1807 07/28/2017 1742 DNR [id number]  Florencia Reasons, MD Inpatient   07/26/2017 0313 07/27/2017 1807 Full Code [id number]  Hijazi,  Deatra Canter, MD Inpatient   09/24/2014 2342 09/26/2014 1709 Full Code [id number]  Rise Patience, MD Inpatient   06/29/2014 1423 06/30/2014 1850 Full Code [id number]  Troy Sine, MD Inpatient   06/25/2014 0154 06/29/2014 1423 Full Code  [id number]  Orvan Falconer, MD ED   04/21/2014 1817 04/24/2014 1821 Full Code [id number]  Donne Hazel, MD ED   01/26/2012 0309 02/01/2012 1642 Full Code [id number]  Hardie Shackleton, RN Inpatient   Advance Care Planning Activity    Advance Directive Documentation     Most Recent Value  Type of Advance Directive  Living will  Pre-existing out of facility DNR order (yellow form or pink MOST form)  -  "MOST" Form in Place?  -        IV Access:    Peripheral IV   Procedures and diagnostic studies:   No results found.   Medical Consultants:    None.  Anti-Infectives:   None  Subjective:    [name] she relates she feels great rated on home.  Objective:    Vitals:   01/28/19 1920 01/29/19 0309 01/29/19 0500 01/29/19 0725  BP: (!) 168/75 (!) 157/74  (!) 179/73  Pulse: 87 88  81  Resp: 16 19  20   Temp: 98.4 F (36.9 C) 98.6 F (37 C)  98.2 F (36.8 C)  TempSrc: Oral Oral  Oral  SpO2: 93% 92%  98%  Weight:   (!) 147.2 kg   Height:       SpO2: 98 %   Intake/Output Summary (Last 24 hours) at 01/29/2019 0806 Last data filed at 01/28/2019 1920 Gross per 24 hour  Intake 490 ml  Output 1000 ml  Net -510 ml   Filed Weights   01/27/19 0400 01/28/19 0359 01/29/19 0500  Weight: (!) 147.8 kg (!) 146.4 kg (!) 147.2 kg    Exam: General exam: In no acute distress. Respiratory system: Good air movement and diffuse crackles bilaterally. Cardiovascular system: S1 & S2 heard, RRR. No JVD. Gastrointestinal system: Abdomen is nondistended, soft and nontender.  Central nervous system: Alert and oriented. No focal neurological deficits. Extremities: No pedal edema. Skin: No rashes, lesions or ulcers Psychiatry: Judgement and insight appear normal. Mood & affect appropriate.   Data Reviewed:    Labs: Basic Metabolic Panel: Recent Labs  Lab 01/24/19 2042 01/26/19 0110  01/27/19 0322  01/28/19 0220 01/29/19 0310  NA 132* 132*  --  134*  --  134* 134*  K  4.5 5.7*   < > 5.8*   < > 5.2* 4.8  CL 98 100  --  102  --  102 100  CO2 23 22  --  22  --  22 23  GLUCOSE 257* 368*  --  262*  --  225* 256*  BUN 33* 38*  --  49*  --  52* 49*  CREATININE 1.71* 1.65*  --  1.55*  --  1.38* 1.39*  CALCIUM 8.3* 8.4*  --  8.7*  --  8.7* 9.0   < > = values in this interval not displayed.   GFR Estimated Creatinine Clearance: 46.6 mL/min (A) (by C-G formula based on SCr of 1.39 mg/dL (H)). Liver Function Tests: Recent Labs  Lab 01/25/19 1324 01/26/19 0110 01/27/19 0322 01/28/19 0220 01/29/19 0310  AST 22 20 17 19 18   ALT 17 15 14 14 14   ALKPHOS 71 67 59 57 63  BILITOT 0.4 0.2* 0.5 0.5 0.2*  PROT 7.8 7.3 7.2  7.4 7.6  ALBUMIN 2.6* 2.7* 2.8* 2.8* 3.0*   No results for input(s): LIPASE, AMYLASE in the last 168 hours. No results for input(s): AMMONIA in the last 168 hours. Coagulation profile No results for input(s): INR, PROTIME in the last 168 hours. COVID-19 Labs  Recent Labs    01/27/19 0322 01/28/19 0220 01/29/19 0310  DDIMER 0.75* 0.53* 0.85*  FERRITIN 188 175  --   CRP 6.8* 4.0*  --     Lab Results  Component Value Date   SARSCOV2NAA POSITIVE (A) 01/25/2019   SARSCOV2NAA NOT DETECTED 10/19/2018    CBC: Recent Labs  Lab 01/24/19 2042 01/26/19 0110 01/27/19 0322 01/28/19 0220 01/29/19 0310  WBC 6.0 6.1 9.1 9.5 8.9  NEUTROABS  --  5.0 7.7 7.8* PENDING  HGB 9.8* 8.8* 8.6* 8.8* 9.6*  HCT 31.5* 28.8* 27.5* 28.0* 30.1*  MCV 92.4 93.2 92.0 90.9 92.3  PLT 212 208 231 265 335   Cardiac Enzymes: No results for input(s): CKTOTAL, CKMB, CKMBINDEX, TROPONINI in the last 168 hours. BNP (last 3 results) No results for input(s): PROBNP in the last 8760 hours. CBG: Recent Labs  Lab 01/27/19 2106 01/28/19 0815 01/28/19 1201 01/28/19 1552 01/28/19 2110  GLUCAP 251* 287* 385* 432* 380*   D-Dimer: Recent Labs    01/28/19 0220 01/29/19 0310  DDIMER 0.53* 0.85*   Hgb A1c: No results for input(s): HGBA1C in the last 72 hours.  Lipid Profile: No results for input(s): CHOL, HDL, LDLCALC, TRIG, CHOLHDL, LDLDIRECT in the last 72 hours. Thyroid function studies: No results for input(s): TSH, T4TOTAL, T3FREE, THYROIDAB in the last 72 hours.  Invalid input(s): FREET3 Anemia work up: Recent Labs    01/27/19 0322 01/28/19 0220  FERRITIN 188 175   Sepsis Labs: Recent Labs  Lab 01/25/19 0903 01/25/19 1324 01/26/19 0110 01/27/19 0322 01/28/19 0220 01/29/19 0310  PROCALCITON  --  <0.10  --   --   --   --   WBC  --   --  6.1 9.1 9.5 8.9  LATICACIDVEN 1.5  --   --   --   --   --    Microbiology Recent Results (from the past 240 hour(s))  SARS Coronavirus 2 [facility] order, Performed in [facility] hospital lab) Nasopharyngeal Nasopharyngeal Swab     Status: Abnormal   Collection Time: 01/25/19  8:50 AM   Specimen: Nasopharyngeal Swab  Result Value Ref Range Status   SARS Coronavirus 2 POSITIVE (A) NEGATIVE Final    Comment: RESULT CALLED TO, READ BACK BY AND VERIFIED WITH: H. MOON RN, AT 5188 01/25/19 BY D. VANHOOK (NOTE) If result is NEGATIVE SARS-CoV-2 target nucleic acids are NOT DETECTED. The SARS-CoV-2 RNA is generally detectable in upper and lower  respiratory specimens during the acute phase of infection. The lowest  concentration of SARS-CoV-2 viral copies this assay can detect is 250  copies / mL. A negative result does not preclude SARS-CoV-2 infection  and should not be used as the sole basis for treatment or other  patient management decisions.  A negative result may occur with  improper specimen collection / handling, submission of specimen other  than nasopharyngeal swab, presence of viral mutation(s) within the  areas targeted by this assay, and inadequate number of viral copies  (<250 copies / mL). A negative result must be combined with clinical  observations, patient history, and epidemiological information. If result is POSITIVE SARS-CoV-2 target nucleic acids are DETECT ED. The  SARS-CoV-2 RNA is generally detectable in  upper and lower  respiratory specimens during the acute phase of infection.  Positive  results are indicative of active infection with SARS-CoV-2.  Clinical  correlation with patient history and other diagnostic information is  necessary to determine patient infection status.  Positive results do  not rule out bacterial infection or co-infection with other viruses. If result is PRESUMPTIVE POSTIVE SARS-CoV-2 nucleic acids MAY BE PRESENT.   A presumptive positive result was obtained on the submitted specimen  and confirmed on repeat testing.  While 2019 novel coronavirus  (SARS-CoV-2) nucleic acids may be present in the submitted sample  additional confirmatory testing may be necessary for epidemiological  and / or clinical management purposes  to differentiate between  SARS-CoV-2 and other Sarbecovirus currently known to infect humans.  If clinically indicated additional testing with an alternate test  methodology (LAB74 53) is advised. The SARS-CoV-2 RNA is generally  detectable in upper and lower respiratory specimens during the acute  phase of infection. The expected result is Negative. Fact Sheet for Patients:  StrictlyIdeas.no Fact Sheet for Healthcare Providers: BankingDealers.co.za This test is not yet approved or cleared by the [place] FDA and has been authorized for detection and/or diagnosis of SARS-CoV-2 by FDA under an Emergency Use Authorization (EUA).  This EUA will remain in effect (meaning this test can be used) for the duration of the COVID-19 declaration under Section 564(b)(1) of the Act, 21 U.S.C. section 360bbb-3(b)(1), unless the authorization is terminated or revoked sooner. Performed at [facility], [name] [address], [place], [place] [address]   Culture, blood (Routine X 2) w Reflex to ID Panel     Status: None (Preliminary result)   Collection Time: 01/25/19   9:25 AM   Specimen: BLOOD LEFT HAND  Result Value Ref Range Status   Specimen Description BLOOD LEFT HAND  Final   Special Requests   Final    BOTTLES DRAWN AEROBIC ONLY Blood Culture results may not be optimal due to an inadequate volume of blood received in culture bottles   Culture   Final    NO GROWTH 3 DAYS Performed at [facility], [place] [address] [place], [place] [address]    Report Status PENDING  Incomplete  Culture, blood (Routine X 2) w Reflex to ID Panel     Status: None (Preliminary result)   Collection Time: 01/25/19  9:25 AM   Specimen: BLOOD  Result Value Ref Range Status   Specimen Description BLOOD LEFT ANTECUBITAL  Final   Special Requests   Final    BOTTLES DRAWN AEROBIC AND ANAEROBIC Blood Culture results may not be optimal due to an excessive volume of blood received in culture bottles   Culture   Final    NO GROWTH 3 DAYS Performed at [facility], [place] [address], [place], [place] [address]    Report Status PENDING  Incomplete     Medications:   . anastrozole  1 mg Oral Daily  . dexamethasone  4 mg Oral Daily   Followed by  . [START ON 01/31/2019] dexamethasone  2 mg Oral Daily  . DULoxetine  60 mg Oral Daily  . famotidine  20 mg Oral Daily  . febuxostat  40 mg Oral Daily  . hydrALAZINE  50 mg Oral BID  . insulin aspart  0-20 Units Subcutaneous TID WC  . insulin aspart  0-5 Units Subcutaneous QHS  . insulin aspart  6 Units Subcutaneous TID WC  . insulin glargine  40  Units Subcutaneous Daily  . Ipratropium-Albuterol  1 puff Inhalation Q6H  . isosorbide mononitrate  60 mg Oral Daily  . magnesium oxide  400 mg Oral Daily  . memantine  10 mg Oral q morning - 10a  . memantine  5 mg Oral QHS  . metoprolol succinate  25 mg Oral Daily  . mometasone-formoterol  2 puff Inhalation BID  . polyethylene glycol  17 g Oral Daily  . pravastatin  20 mg Oral Daily  . rivaroxaban  10 mg Oral Daily  . sodium chloride flush  3 mL Intravenous Once  .  vitamin C  500 mg Oral Daily  . zinc sulfate  220 mg Oral Daily   Continuous Infusions: . remdesivir 100 mg in NS 250 mL 100 mg (01/28/19 1015)      LOS: 4 days   Charlynne Cousins  [facility]  01/29/2019, 8:06 AM

## 2019-01-30 LAB — CBC WITH DIFFERENTIAL/PLATELET
Abs Immature Granulocytes: 0.05 10*3/uL (ref 0.00–0.07)
Basophils Absolute: 0 10*3/uL (ref 0.0–0.1)
Basophils Relative: 0 %
Eosinophils Absolute: 0 10*3/uL (ref 0.0–0.5)
Eosinophils Relative: 0 %
HCT: 27.5 % — ABNORMAL LOW (ref 36.0–46.0)
Hemoglobin: 8.9 g/dL — ABNORMAL LOW (ref 12.0–15.0)
Immature Granulocytes: 1 %
Lymphocytes Relative: 18 %
Lymphs Abs: 2 10*3/uL (ref 0.7–4.0)
MCH: 29.5 pg (ref 26.0–34.0)
MCHC: 32.4 g/dL (ref 30.0–36.0)
MCV: 91.1 fL (ref 80.0–100.0)
Monocytes Absolute: 0.9 10*3/uL (ref 0.1–1.0)
Monocytes Relative: 8 %
Neutro Abs: 8.1 10*3/uL — ABNORMAL HIGH (ref 1.7–7.7)
Neutrophils Relative %: 73 %
Platelets: 315 10*3/uL (ref 150–400)
RBC: 3.02 MIL/uL — ABNORMAL LOW (ref 3.87–5.11)
RDW: 13.7 % (ref 11.5–15.5)
WBC: 11.1 10*3/uL — ABNORMAL HIGH (ref 4.0–10.5)
nRBC: 0 % (ref 0.0–0.2)

## 2019-01-30 LAB — CULTURE, BLOOD (ROUTINE X 2)
Culture: NO GROWTH
Culture: NO GROWTH

## 2019-01-30 LAB — D-DIMER, QUANTITATIVE: D-Dimer, Quant: 0.64 ug/mL-FEU — ABNORMAL HIGH (ref 0.00–0.50)

## 2019-01-30 LAB — FERRITIN: Ferritin: 164 ng/mL (ref 11–307)

## 2019-01-30 LAB — COMPREHENSIVE METABOLIC PANEL
ALT: 16 U/L (ref 0–44)
AST: 17 U/L (ref 15–41)
Albumin: 2.8 g/dL — ABNORMAL LOW (ref 3.5–5.0)
Alkaline Phosphatase: 61 U/L (ref 38–126)
Anion gap: 8 (ref 5–15)
BUN: 52 mg/dL — ABNORMAL HIGH (ref 8–23)
CO2: 24 mmol/L (ref 22–32)
Calcium: 8.9 mg/dL (ref 8.9–10.3)
Chloride: 102 mmol/L (ref 98–111)
Creatinine, Ser: 1.31 mg/dL — ABNORMAL HIGH (ref 0.44–1.00)
GFR calc Af Amer: 44 mL/min — ABNORMAL LOW (ref >60)
GFR calc non Af Amer: 38 mL/min — ABNORMAL LOW (ref >60)
Glucose, Bld: 246 mg/dL — ABNORMAL HIGH (ref 70–99)
Potassium: 4.9 mmol/L (ref 3.5–5.1)
Sodium: 134 mmol/L — ABNORMAL LOW (ref 135–145)
Total Bilirubin: 0.3 mg/dL (ref 0.3–1.2)
Total Protein: 7.2 g/dL (ref 6.5–8.1)

## 2019-01-30 LAB — C-REACTIVE PROTEIN: CRP: 1 mg/dL — ABNORMAL HIGH (ref <1.0)

## 2019-01-30 LAB — GLUCOSE, CAPILLARY
Glucose-Capillary: 240 mg/dL — ABNORMAL HIGH (ref 70–99)
Glucose-Capillary: 175 mg/dL — ABNORMAL HIGH (ref 70–99)
Glucose-Capillary: 259 mg/dL — ABNORMAL HIGH (ref 70–99)

## 2019-01-30 MED ORDER — INFLUENZA VAC A&B SA ADJ QUAD 0.5 ML IM PRSY
0.5000 mL | PREFILLED_SYRINGE | INTRAMUSCULAR | Status: DC
  Filled 2019-01-30: qty 0.5

## 2019-01-30 MED ORDER — OXYCODONE-ACETAMINOPHEN 10-325 MG PO TABS: 1.0000 | ORAL_TABLET | Freq: Four times a day (QID) | ORAL | 0 refills | Status: DC

## 2019-01-30 NOTE — Discharge Instructions (Signed)

## 2019-01-30 NOTE — Progress Notes (Signed)
Physical Therapy Treatment Patient Details Name: [name] MRN: [id number] DOB: 04/05/1935 Today's Date: 01/30/2019    History of Present Illness 84 y.o. female with medical history significant of memory loss, hypertension, hyperlipidemia, CHF, insulin-dependent diabetes mellitus, CAD, depression, anxiety, chronic pain syndrome, GERD, and morbid obesity; who presented 01/25/19 with complaints of chest pain and shortness of breath for the last 3-4 days. At baseline patient is wheelchair-bound, but usually able to transition on her own.  She reports going down to [place] to visit family approximately 2 weeks ago where someone had found to be positive for COVID-19.     PT Comments    Pt did well with tx this am. Pt was given written and illustrated HEP and completed demo with return demo on all exercises with green theraband. Had some difficulty with w/c push ups needing increased recovery time after each rep but sats on room air remained >95% throughout.   Follow Up Recommendations  Supervision for mobility/OOB     Equipment Recommendations       Recommendations for Other Services       Precautions / Restrictions Precautions Precautions: Fall Precaution Comments: no new complaints this session Restrictions Weight Bearing Restrictions: No    Mobility  Bed Mobility Overal bed mobility: Modified Independent             General bed mobility comments: has been able to get in/out of bed with mod I, takes increased time to complete  Transfers Overall transfer level: Needs assistance Equipment used: Rolling walker (2 wheeled) Transfers: Sit to/from Stand Sit to Stand: Modified independent (Device/Increase time)   Squat pivot transfers: Min guard        Ambulation/Gait                 Stairs             Wheelchair Mobility    Modified Rankin (Stroke Patients Only)       Balance Overall balance assessment: Modified Independent Sitting-balance  support: No upper extremity supported Sitting balance-Leahy Scale: Good     Standing balance support: Bilateral upper extremity supported Standing balance-Leahy Scale: Fair                              Cognition Arousal/Alertness: Awake/alert Behavior During Therapy: WFL for tasks assessed/performed Overall Cognitive Status: Within Functional Limits for tasks assessed                                 General Comments: cognition seems to be functional, able to talk about family hx etc      Exercises General Exercises - Upper Extremity Shoulder Horizontal ABduction: Strengthening;5 reps Shoulder Horizontal ADduction: Strengthening;5 reps Elbow Flexion: Strengthening;5 reps Elbow Extension: Strengthening;5 reps Chair Push Up: 5 reps General Exercises - Lower Extremity Ankle Circles/Pumps: Strengthening;10 reps;AROM Gluteal Sets: Strengthening;10 reps Long Arc Quad: Strengthening;10 reps Hip ABduction/ADduction: Strengthening;10 reps Hip Flexion/Marching: Strengthening;10 reps    General Comments General comments (skin integrity, edema, etc.): Does better with mobility this am, takes increased time to complete but able to complete with SBA-Mod I, 02 sats on room air remain >95%      Pertinent Vitals/Pain Pain Assessment: 0-10 Pain Score: 4  Pain Location: R knee Pain Descriptors / Indicators: Aching Pain Intervention(s): Limited activity within patient's tolerance    Home Living  Prior Function            PT Goals (current goals can now be found in the care plan section) Acute Rehab PT Goals Patient Stated Goal: go home with family PT Goal Formulation: With patient Time For Goal Achievement: 02/10/19 Potential to Achieve Goals: Good Progress towards PT goals: Progressing toward goals    Frequency    Min 3X/week      PT Plan Other (comment)(Home w/ HH)    Co-evaluation              AM-PAC PT "6  Clicks" Mobility   Outcome Measure  Help needed turning from your back to your side while in a flat bed without using bedrails?: None Help needed moving from lying on your back to sitting on the side of a flat bed without using bedrails?: None Help needed moving to and from a bed to a chair (including a wheelchair)?: A Little Help needed standing up from a chair using your arms (e.g., wheelchair or bedside chair)?: A Little Help needed to walk in hospital room?: A Lot Help needed climbing 3-5 steps with a railing? : Total 6 Click Score: 17    End of Session   Activity Tolerance: Patient limited by fatigue Patient left: in chair;with call bell/phone within reach Nurse Communication: Mobility status PT Visit Diagnosis: Muscle weakness (generalized) (M62.81);Dizziness and giddiness (R42)     Time: 4332-9518 PT Time Calculation (min) (ACUTE ONLY): 28 min  Charges:  $Therapeutic Exercise: 23-37 mins                     Horald Chestnut, PT    Delford Field 01/30/2019, 11:33 AM

## 2019-01-30 NOTE — Care Management Important Message (Signed)
Important Message  Patient Details  Name: [name] MRN: [id number] Date of Birth: Mar 30, 1935   Medicare Important Message Given:  Yes - Important Message mailed due to current National Emergency  Verbal consent obtained due to current National Emergency  Relationship to patient: Child Contact Name: [name] Call Date: 01/30/19  Time: 1115 Phone: [phone] Outcome: Spoke with contact Important Message mailed to: Other (must enter comment)(emailed to [email or web address])    Ajani Schnieders P Ki Corbo 01/30/2019, 11:16 AM

## 2019-01-30 NOTE — TOC Transition Note (Addendum)
Transition of Care [facility]) - CM/SW Discharge Note   Patient Details  Name: [name] MRN: [id number] Date of Birth: [birth year]  Transition of Care [facility]) CM/SW Contact:  Ninfa Meeker, RN Phone Number: [phone] (working remotely) 01/30/2019, 11:09 AM   Clinical Narrative:   84 yr old female admitted from home with [place] 19. Thankfully patient is improving and will return home. Case manager spoke with patient's daughter, [name] to confirm that mom is coming home and to [facility] choice for [facility] agency. Referral was called to Adela Lank, [facility] Liaison. Patient has DME at home. Daughter cares for patient. PTAR will be arranged for transport home for 1pm.  Medical necessity form and facesheet printed to nursing unit.   Final next level of care: [place] Barriers to Discharge: No Barriers Identified   Patient Goals and CMS Choice Patient states their goals for this hospitalization and ongoing recovery are:: to get better   Choice offered to / list presented to : Adult Children  Discharge Placement                       Discharge Plan and Services   Discharge Planning Services: CM Consult Post Acute Care Choice: Home Health          DME Arranged: N/A DME Agency: NA       HH Arranged: PT, OT HH Agency: [place] Date [facility] Agency Contacted: 01/30/19 Time [place] Agency Contacted: 1107 Representative spoke with at [place]: Adela Lank  Social Determinants of Health ([place]) Interventions     Readmission Risk Interventions Readmission Risk Prevention Plan 01/27/2019  Transportation Screening Complete  Medication Review Press photographer) Complete  PCP or Specialist appointment within 3-5 days of discharge Complete  HRI or [place] Complete  SW Recovery Care/Counseling Consult Complete  [place] Not Applicable  Some recent data might be hidden

## 2019-01-30 NOTE — Progress Notes (Signed)
Occupational Therapy Treatment Patient Details Name: [name] MRN: [id number] DOB: [birth year] Today's Date: 01/30/2019    History of present illness 84 y.o. female with medical history significant of memory loss, hypertension, hyperlipidemia, CHF, insulin-dependent diabetes mellitus, CAD, depression, anxiety, chronic pain syndrome, GERD, and morbid obesity; who presented 01/25/19 with complaints of chest pain and shortness of breath for the last 3-4 days. At baseline patient is wheelchair-bound, but usually able to transition on her own.  She reports going down to [place] to visit family approximately 2 weeks ago where someone had found to be positive for COVID-19.    OT comments  Pt has made significant progress. Session completed on RA with VSS. Pt able to complete squat pivot transfer to recliner, simulating her transfer to wc with S. Requires assistance for hygiene after toileting, which is her baseline. Feel pt is close to baseline level of function and is appropriate to DC home with her daughter providing S for mobility and assistance with ADL. Pt would benefit from [place]. Discussed with CM.   Follow Up Recommendations  Home health OT;Other (comment)(Supervision with mobility)    Equipment Recommendations  None recommended by OT    Recommendations for Other Services      Precautions / Restrictions Precautions Precautions: Fall       Mobility Bed Mobility Overal bed mobility: Modified Independent                Transfers Overall transfer level: Needs assistance   Transfers: Squat Pivot Transfers     Squat pivot transfers: S Unsafe descent to recliner, which pt agrees is her baseline          Balance Overall balance assessment: Needs assistance   Sitting balance-Leahy Scale: Good       Standing balance-Leahy Scale: Fair                             ADL either performed or assessed with clinical judgement   ADL Overall ADL's : Needs  assistance/impaired     Grooming: Set up;Sitting   Upper Body Bathing: Minimal assistance;Sitting   Lower Body Bathing: Moderate assistance;Sit to/from stand   Upper Body Dressing : Set up;Sitting   Lower Body Dressing: Moderate assistance;Sit to/from stand   Toilet Transfer: S;BSC;Squat-pivot   Toileting- Water quality scientist and Hygiene: Sit to/from stand;Moderate assistance       Functional mobility during ADLs: Min guard General ADL Comments: Daughter assists with ADL at baseline due to body habitus. Pt would most likely benefit from AE, including a toilet aid     Vision       Perception     Praxis      Cognition Arousal/Alertness: Awake/alert Behavior During Therapy: WFL for tasks assessed/performed Overall Cognitive Status: Within Functional Limits for tasks assessed                                          Exercises     Shoulder Instructions       General Comments      Pertinent Vitals/ Pain       Pain Assessment: No/denies pain  Home Living  Prior Functioning/Environment              Frequency  Min 2X/week        Progress Toward Goals  OT Goals(current goals can now be found in the care plan section)  Progress towards OT goals: Progressing toward goals  Acute Rehab OT Goals Patient Stated Goal: to regain strength and ind OT Goal Formulation: With patient Time For Goal Achievement: 02/10/19 Potential to Achieve Goals: Good ADL Goals Pt Will Perform Grooming: standing;with min assist Pt Will Perform Lower Body Bathing: with min assist;sit to/from stand;sitting/lateral leans Pt Will Perform Lower Body Dressing: with min assist;sitting/lateral leans;sit to/from stand Pt Will Transfer to Toilet: bedside commode;with min guard assist Additional ADL Goal #1: Pt will recall and/or apply 1-3 ECS strategies while engaging in ADL task for successful completion   Plan Discharge plan needs to be updated    Co-evaluation                 AM-PAC OT "6 Clicks" Daily Activity     Outcome Measure   Help from another person eating meals?: None Help from another person taking care of personal grooming?: A Little Help from another person toileting, which includes using toliet, bedpan, or urinal?: A Lot Help from another person bathing (including washing, rinsing, drying)?: A Lot Help from another person to put on and taking off regular upper body clothing?: A Little Help from another person to put on and taking off regular lower body clothing?: A Lot 6 Click Score: 16    End of Session    OT Visit Diagnosis: Unsteadiness on feet (R26.81);Other abnormalities of gait and mobility (R26.89);Muscle weakness (generalized) (M62.81);Dizziness and giddiness (R42);Pain   Activity Tolerance Patient tolerated treatment well   Patient Left in chair;with call bell/phone within reach   Nurse Communication Mobility status        Time: 1005-1029 OT Time Calculation (min): 24 min  Charges: OT General Charges $OT Visit: 1 Visit OT Treatments $Self Care/Home Management : 23-37 mins  Maurie Boettcher, OT/L   Acute OT Clinical Specialist [place] Pager [phone] Office [phone]    [facility] 01/30/2019, 10:59 AM

## 2019-01-30 NOTE — Discharge Summary (Signed)
Physician Discharge Summary  [name] QIO:[id number] DOB: November 14, 1934 DOA: 01/24/2019  PCP: Nichole Chandler, NP  Admit date: 01/24/2019 Discharge date: 01/30/2019  Admitted From: Home Disposition:  SNF  Recommendations for Outpatient Follow-up:  1. Follow up with PCP in 1-2 weeks 2. Please obtain basic metabolic panel on 07/29/4008 and follow-up on her potassium. 3. Follow-up CBGs before meals and at bedtime and titrate insulin as needed.   Home Health:No Equipment/Devices:None  Discharge Condition:stable CODE STATUS:DR Diet recommendation: Heart Healthy  Brief/Interim Summary: 84 year old with past medical history of essential hypertension, hyperlipidemia chronic diastolic heart failure insulin-dependent diabetes mellitus, chronic pain morbid obesity who presented to the hospital for shortness of breath and chest pain that started 4 days prior to admission, the patient is wheelchair-bound, she relates she went down to see family approximately 2 weeks prior to admission when someone in her family was found to bePositive for SARS-CoV-2 she started having symptoms of shortness of breath and cough prior to admission.  Discharge Diagnoses:  Pneumonia due to COVID-19 virus: Initially was started on IV empiric antibiotics we will discontinue she had low probability for bacterial infection.  She does have positive for SARS-CoV-2. She became short of breath and oxygen dependent she was started on IV Remdesivir and steroids for which she completed 5 days of treatment. Physical therapy evaluated the patient they recommended skilled nursing facility.  Pleuritic chest pain: In the setting of coronary artery disease, her EKG did not show signs of ischemia, her troponins was negative on admission this was likely due to her pneumonia.  Essential hypertension: No changes made to her medication continue current regimen.  Chronic kidney disease stage III/hyperkalemia: With a baseline  creatinine of 1.5-1.8. Her creatinine remained at baseline she did had a mild hypokalemia for which she was giving Lokelma potassium today is 4.9. We will have to repeat potassium in 2 days.  Chronic diastolic heart failure: She does not appear to be volume overloaded no changes made to her medication.  Insulin-dependent diabetes mellitus type 2: Her A1c was 11.7 in the hospital she was on Lantus 40 unit plus sliding scale insulin and her blood glucose was fairly controlled, question if she has been compliant with her medication at home. We will not change her regimen at this point. Continue CBG before meals and at bedtime titrate insulin as needed as an outpatient.  History of COPD/chronic bronchitis: No changes made to her medication continue inhalers.  Anemia of chronic disease: Hemoglobin stable.  Chronic pain syndrome: Continue current regimen no changes made.  Hypovolemic hyponatremia: Likely multifactorial in the setting of COVID-19 infection and overall decreased intake this resolved with IV fluid hydration.  History of anxiety and depression: Continue Cymbalta.  History of DVT and PE on chronic anticoagulation: Continue Xarelto no changes made.  Hyperlipidemia: Continue statins.   Discharge Instructions  Discharge Instructions    Diet - low sodium heart healthy   Complete by: As directed    Increase activity slowly   Complete by: As directed      Allergies as of 01/30/2019      Reactions   Sulfonamide Derivatives Swelling   Mouth swelling   Aricept [donepezil Hcl] Diarrhea, Nausea And Vomiting   GI upset/loose stools   Tramadol Nausea And Vomiting      Medication List    TAKE these medications   albuterol 108 (90 Base) MCG/ACT inhaler Commonly known as: ProAir HFA Inhale 1-2 puffs into the lungs every 6 (six) hours as needed  for wheezing or shortness of breath.   anastrozole 1 MG tablet Commonly known as: ARIMIDEX TAKE 1 TABLET BY MOUTH EVERY DAY    budesonide-formoterol 160-4.5 MCG/ACT inhaler Commonly known as: SYMBICORT Inhale 2 puffs into the lungs 2 (two) times daily. For bronchitis What changed:   when to take this  reasons to take this   clonazePAM 1 MG tablet Commonly known as: KLONOPIN Take two tablets by mouth at bedtime as needed for anxiety   DULoxetine 60 MG capsule Commonly known as: CYMBALTA Take one capsule by mouth once daily for anxiety What changed:   how much to take  how to take this  when to take this  additional instructions   febuxostat 40 MG tablet Commonly known as: Uloric Take 1 tablet (40 mg total) by mouth daily.   FreeStyle Libre 14 Day Reader [name] 1 Device by Does not apply route 4 (four) times daily -  before meals and at bedtime. For diabetes   FreeStyle Libre 14 Day Sensor Misc 1 Units by Does not apply route 4 (four) times daily -  before meals and at bedtime. For diabetes   furosemide 20 MG tablet Commonly known as: LASIX Take 1 tablet (20 mg total) by mouth daily as needed. What changed: reasons to take this   hydrALAZINE 50 MG tablet Commonly known as: APRESOLINE TAKE 1 TABLET BY MOUTH 2 TIMES DAILY   Insulin Glargine (2 Unit Dial) 300 UNIT/ML Sopn Commonly known as: Toujeo Max SoloStar Inject 40 Units into the skin daily. Patient assistance program provides   isosorbide mononitrate 60 MG 24 hr tablet Commonly known as: IMDUR Take one tablet by mouth once daily What changed:   how much to take  how to take this  when to take this  additional instructions   magnesium oxide 400 MG tablet Commonly known as: MAG-OX Take 400 mg by mouth daily.   meclizine 25 MG tablet Commonly known as: ANTIVERT Take one tablet by mouth three times daily as needed for diziness What changed:   how much to take  how to take this  when to take this  reasons to take this  additional instructions   memantine 5 MG tablet Commonly known as: NAMENDA TAKE 2 TABLETS BY  MOUTH EVERY MORNING and TAKE 1 TABLET BY MOUTH EVERY EVENING FOR MEMORY What changed:   how much to take  how to take this  when to take this  additional instructions   metoprolol succinate 25 MG 24 hr tablet Commonly known as: TOPROL-XL Take one tablet by mouth once daily for blood pressure What changed:   how much to take  how to take this  when to take this  additional instructions   nitroGLYCERIN 0.4 MG SL tablet Commonly known as: Nitrostat Take one tablet under the tongue every 5 minutes as needed for chest pain   NovoLOG FlexPen 100 UNIT/ML FlexPen Generic drug: insulin aspart Inject 12 Units into the skin 3 (three) times daily with meals. Max 45 units What changed:   how much to take  when to take this  additional instructions   oxyCODONE-acetaminophen 10-325 MG tablet Commonly known as: PERCOCET Take 1 tablet by mouth every 6 (six) hours.   pantoprazole 40 MG tablet Commonly known as: PROTONIX Take 1 tablet (40 mg total) by mouth daily.   pravastatin 20 MG tablet Commonly known as: PRAVACHOL Take one tablet by mouth once daily for cholesterol What changed:   how much to take  how  to take this  when to take this  additional instructions   senna-docusate 8.6-50 MG tablet Commonly known as: Senokot-S Take 1 tablet by mouth at bedtime.   sodium polystyrene 15 GM/60ML suspension Commonly known as: KAYEXALATE 15 gm by mouth weekly to keep potassium down. What changed:   how much to take  how to take this  when to take this  additional instructions   Xarelto 10 MG Tabs tablet Generic drug: rivaroxaban TAKE 1 TABLET BY MOUTH EVERY DAY What changed: how much to take       Allergies  Allergen Reactions  . Sulfonamide Derivatives Swelling    Mouth swelling  . Aricept [Donepezil Hcl] Diarrhea and Nausea And Vomiting    GI upset/loose stools  . Tramadol Nausea And Vomiting    Consultations:  None   Procedures/Studies: Dg  Chest 1 View  Result Date: 01/24/2019 CLINICAL DATA:  Shortness of breath EXAM: CHEST  1 VIEW COMPARISON:  August 27, 2018 FINDINGS: The heart size and mediastinal contours are within normal limits. There is mildly increased hazy airspace opacity seen at the bilateral lower lungs. Overall shallow degree of aeration. The visualized skeletal structures are unremarkable. IMPRESSION: Mildly increased hazy airspace opacity at both lower lungs which is non-specific could be due to atelectasis and/or early infectious etiology. Electronically Signed   By: Prudencio Pair M.D.   On: 01/24/2019 21:19     Subjective: No complains  Discharge Exam: Vitals:   01/30/19 0404 01/30/19 0405  BP:  (!) 169/77  Pulse:  66  Resp:  19  Temp: 98.4 F (36.9 C)   SpO2:  94%   Vitals:   01/29/19 2050 01/30/19 0404 01/30/19 0405 01/30/19 0411  BP: (!) 182/88  (!) 169/77   Pulse: 71  66   Resp: (!) 27  19   Temp: 98.4 F (36.9 C) 98.4 F (36.9 C)    TempSrc: Oral Oral    SpO2: 98%  94%   Weight:    (!) 147.7 kg  Height:        General: Pt is alert, awake, not in acute distress Cardiovascular: RRR, S1/S2 +, no rubs, no gallops Respiratory: CTA bilaterally, no wheezing, no rhonchi Abdominal: Soft, NT, ND, bowel sounds + Extremities: no edema, no cyanosis    The results of significant diagnostics from this hospitalization (including imaging, microbiology, ancillary and laboratory) are listed below for reference.     Microbiology: Recent Results (from the past 240 hour(s))  SARS Coronavirus 2 [facility] order, Performed in [facility] hospital lab) Nasopharyngeal Nasopharyngeal Swab     Status: Abnormal   Collection Time: 01/25/19  8:50 AM   Specimen: Nasopharyngeal Swab  Result Value Ref Range Status   SARS Coronavirus 2 POSITIVE (A) NEGATIVE Final    Comment: RESULT CALLED TO, READ BACK BY AND VERIFIED WITH: H. MOON RN, AT 8891 01/25/19 BY D. VANHOOK (NOTE) If result is NEGATIVE SARS-CoV-2 target  nucleic acids are NOT DETECTED. The SARS-CoV-2 RNA is generally detectable in upper and lower  respiratory specimens during the acute phase of infection. The lowest  concentration of SARS-CoV-2 viral copies this assay can detect is 250  copies / mL. A negative result does not preclude SARS-CoV-2 infection  and should not be used as the sole basis for treatment or other  patient management decisions.  A negative result may occur with  improper specimen collection / handling, submission of specimen other  than nasopharyngeal swab, presence of viral mutation(s) within the  areas targeted by this assay, and inadequate number of viral copies  (<250 copies / mL). A negative result must be combined with clinical  observations, patient history, and epidemiological information. If result is POSITIVE SARS-CoV-2 target nucleic acids are DETECT ED. The SARS-CoV-2 RNA is generally detectable in upper and lower  respiratory specimens during the acute phase of infection.  Positive  results are indicative of active infection with SARS-CoV-2.  Clinical  correlation with patient history and other diagnostic information is  necessary to determine patient infection status.  Positive results do  not rule out bacterial infection or co-infection with other viruses. If result is PRESUMPTIVE POSTIVE SARS-CoV-2 nucleic acids MAY BE PRESENT.   A presumptive positive result was obtained on the submitted specimen  and confirmed on repeat testing.  While 2019 novel coronavirus  (SARS-CoV-2) nucleic acids may be present in the submitted sample  additional confirmatory testing may be necessary for epidemiological  and / or clinical management purposes  to differentiate between  SARS-CoV-2 and other Sarbecovirus currently known to infect humans.  If clinically indicated additional testing with an alternate test  methodology (LAB74 53) is advised. The SARS-CoV-2 RNA is generally  detectable in upper and lower  respiratory specimens during the acute  phase of infection. The expected result is Negative. Fact Sheet for Patients:  StrictlyIdeas.no Fact Sheet for Healthcare Providers: BankingDealers.co.za This test is not yet approved or cleared by the [place] FDA and has been authorized for detection and/or diagnosis of SARS-CoV-2 by FDA under an Emergency Use Authorization (EUA).  This EUA will remain in effect (meaning this test can be used) for the duration of the COVID-19 declaration under Section 564(b)(1) of the Act, 21 U.S.C. section 360bbb-3(b)(1), unless the authorization is terminated or revoked sooner. Performed at [facility], [place] [address] [place], [place] [address]   Culture, blood (Routine X 2) w Reflex to ID Panel     Status: None (Preliminary result)   Collection Time: 01/25/19  9:25 AM   Specimen: BLOOD LEFT HAND  Result Value Ref Range Status   Specimen Description BLOOD LEFT HAND  Final   Special Requests   Final    BOTTLES DRAWN AEROBIC ONLY Blood Culture results may not be optimal due to an inadequate volume of blood received in culture bottles   Culture   Final    NO GROWTH 4 DAYS Performed at [facility], [place] [address] [place], [place] [address]    Report Status PENDING  Incomplete  Culture, blood (Routine X 2) w Reflex to ID Panel     Status: None (Preliminary result)   Collection Time: 01/25/19  9:25 AM   Specimen: BLOOD  Result Value Ref Range Status   Specimen Description BLOOD LEFT ANTECUBITAL  Final   Special Requests   Final    BOTTLES DRAWN AEROBIC AND ANAEROBIC Blood Culture results may not be optimal due to an excessive volume of blood received in culture bottles   Culture   Final    NO GROWTH 4 DAYS Performed at [facility], [place] [address] [place],  [address]    Report Status PENDING  Incomplete     Labs: BNP (last 3 results) Recent Labs    08/27/18 1355  01/25/19 1324  BNP 57.4 54.2   Basic Metabolic Panel: Recent Labs  Lab 01/26/19 0110  01/27/19 0322 01/27/19 1330 01/28/19 0220 01/29/19 0310 01/30/19 0240  NA 132*  --  134*  --  134* 134* 134*  K 5.7*   < > 5.8* 4.9 5.2* 4.8 4.9  CL 100  --  102  --  102 100 102  CO2 22  --  22  --  22 23 24   GLUCOSE 368*  --  262*  --  225* 256* 246*  BUN 38*  --  49*  --  52* 49* 52*  CREATININE 1.65*  --  1.55*  --  1.38* 1.39* 1.31*  CALCIUM 8.4*  --  8.7*  --  8.7* 9.0 8.9   < > = values in this interval not displayed.   Liver Function Tests: Recent Labs  Lab 01/26/19 0110 01/27/19 0322 01/28/19 0220 01/29/19 0310 01/30/19 0240  AST 20 17 19 18 17   ALT 15 14 14 14 16   ALKPHOS 67 59 57 63 61  BILITOT 0.2* 0.5 0.5 0.2* 0.3  PROT 7.3 7.2 7.4 7.6 7.2  ALBUMIN 2.7* 2.8* 2.8* 3.0* 2.8*   No results for input(s): LIPASE, AMYLASE in the last 168 hours. No results for input(s): AMMONIA in the last 168 hours. CBC: Recent Labs  Lab 01/26/19 0110 01/27/19 0322 01/28/19 0220 01/29/19 0310 01/30/19 0240  WBC 6.1 9.1 9.5 8.9 11.1*  NEUTROABS 5.0 7.7 7.8* 6.7 8.1*  HGB 8.8* 8.6* 8.8* 9.6* 8.9*  HCT 28.8* 27.5* 28.0* 30.1* 27.5*  MCV 93.2 92.0 90.9 92.3 91.1  PLT 208 231 265 335 315   Cardiac Enzymes: No results for input(s): CKTOTAL, CKMB, CKMBINDEX, TROPONINI in the last 168 hours. BNP: Invalid input(s): POCBNP CBG: Recent Labs  Lab 01/28/19 2110 01/29/19 0723 01/29/19 1104 01/29/19 1612 01/29/19 2047  GLUCAP 380* 240* 409* 359* 407*   D-Dimer Recent Labs    01/29/19 0310 01/30/19 0240  DDIMER 0.85* 0.64*   Hgb A1c No results for input(s): HGBA1C in the last 72 hours. Lipid Profile No results for input(s): CHOL, HDL, LDLCALC, TRIG, CHOLHDL, LDLDIRECT in the last 72 hours. Thyroid function studies No results for input(s): TSH, T4TOTAL, T3FREE, THYROIDAB in the last 72 hours.  Invalid input(s): FREET3 Anemia work up Recent Labs    01/29/19 0310  01/30/19 0240  FERRITIN 198 164   Urinalysis    Component Value Date/Time   COLORURINE YELLOW 01/29/2019 1309   APPEARANCEUR HAZY (A) 01/29/2019 1309   LABSPEC 1.016 01/29/2019 1309   PHURINE 5.0 01/29/2019 1309   GLUCOSEU >=500 (A) 01/29/2019 1309   HGBUR NEGATIVE 01/29/2019 1309   BILIRUBINUR NEGATIVE 01/29/2019 1309   BILIRUBINUR Neg 06/29/2017 0908   KETONESUR NEGATIVE 01/29/2019 1309   PROTEINUR 30 (A) 01/29/2019 1309   UROBILINOGEN 0.2 02/16/2018 1514   NITRITE NEGATIVE 01/29/2019 1309   LEUKOCYTESUR MODERATE (A) 01/29/2019 1309   Sepsis Labs Invalid input(s): PROCALCITONIN,  WBC,  LACTICIDVEN Microbiology Recent Results (from the past 240 hour(s))  SARS Coronavirus 2 [facility] order, Performed in [facility] hospital lab) Nasopharyngeal Nasopharyngeal Swab     Status: Abnormal   Collection Time: 01/25/19  8:50 AM   Specimen: Nasopharyngeal Swab  Result Value Ref Range Status   SARS Coronavirus 2 POSITIVE (A) NEGATIVE Final    Comment: RESULT CALLED TO, READ BACK BY AND VERIFIED WITH: H. MOON RN, AT 6720 01/25/19 BY D. VANHOOK (NOTE) If result is NEGATIVE SARS-CoV-2 target nucleic acids are NOT DETECTED. The SARS-CoV-2 RNA is generally detectable in upper and lower  respiratory specimens during the acute phase of infection. The lowest  concentration of SARS-CoV-2 viral copies this assay can detect is 250  copies /  mL. A negative result does not preclude SARS-CoV-2 infection  and should not be used as the sole basis for treatment or other  patient management decisions.  A negative result may occur with  improper specimen collection / handling, submission of specimen other  than nasopharyngeal swab, presence of viral mutation(s) within the  areas targeted by this assay, and inadequate number of viral copies  (<250 copies / mL). A negative result must be combined with clinical  observations, patient history, and epidemiological information. If result is  POSITIVE SARS-CoV-2 target nucleic acids are DETECT ED. The SARS-CoV-2 RNA is generally detectable in upper and lower  respiratory specimens during the acute phase of infection.  Positive  results are indicative of active infection with SARS-CoV-2.  Clinical  correlation with patient history and other diagnostic information is  necessary to determine patient infection status.  Positive results do  not rule out bacterial infection or co-infection with other viruses. If result is PRESUMPTIVE POSTIVE SARS-CoV-2 nucleic acids MAY BE PRESENT.   A presumptive positive result was obtained on the submitted specimen  and confirmed on repeat testing.  While 2019 novel coronavirus  (SARS-CoV-2) nucleic acids may be present in the submitted sample  additional confirmatory testing may be necessary for epidemiological  and / or clinical management purposes  to differentiate between  SARS-CoV-2 and other Sarbecovirus currently known to infect humans.  If clinically indicated additional testing with an alternate test  methodology (LAB74 53) is advised. The SARS-CoV-2 RNA is generally  detectable in upper and lower respiratory specimens during the acute  phase of infection. The expected result is Negative. Fact Sheet for Patients:  StrictlyIdeas.no Fact Sheet for Healthcare Providers: BankingDealers.co.za This test is not yet approved or cleared by the [place] FDA and has been authorized for detection and/or diagnosis of SARS-CoV-2 by FDA under an Emergency Use Authorization (EUA).  This EUA will remain in effect (meaning this test can be used) for the duration of the COVID-19 declaration under Section 564(b)(1) of the Act, 21 U.S.C. section 360bbb-3(b)(1), unless the authorization is terminated or revoked sooner. Performed at [facility], [place] [address] [place], [place] [address]   Culture, blood (Routine X 2) w Reflex to ID Panel      Status: None (Preliminary result)   Collection Time: 01/25/19  9:25 AM   Specimen: BLOOD LEFT HAND  Result Value Ref Range Status   Specimen Description BLOOD LEFT HAND  Final   Special Requests   Final    BOTTLES DRAWN AEROBIC ONLY Blood Culture results may not be optimal due to an inadequate volume of blood received in culture bottles   Culture   Final    NO GROWTH 4 DAYS Performed at [facility], [place] [address], [place], [place] [address]    Report Status PENDING  Incomplete  Culture, blood (Routine X 2) w Reflex to ID Panel     Status: None (Preliminary result)   Collection Time: 01/25/19  9:25 AM   Specimen: BLOOD  Result Value Ref Range Status   Specimen Description BLOOD LEFT ANTECUBITAL  Final   Special Requests   Final    BOTTLES DRAWN AEROBIC AND ANAEROBIC Blood Culture results may not be optimal due to an excessive volume of blood received in culture bottles   Culture   Final    NO GROWTH 4 DAYS Performed at [facility], [place] [address], [place], [place] [address]    Report Status PENDING  Incomplete     Time coordinating discharge: Over 40 minutes  SIGNED:   Charlynne Cousins, MD  [facility] 01/30/2019, 8:03 AM Pager   If 7PM-7AM, please contact night-coverage [email or web address] Password [email or web address]

## 2019-02-04 ENCOUNTER — Telehealth: Payer: Self-pay | Admitting: *Deleted

## 2019-02-04 NOTE — Telephone Encounter (Signed)
Transition Care Management Follow-up Telephone Call  Date of discharge and from where: 01/30/2019 [place]   How have you been since you were released from the hospital? Still having Headaches  Any questions or concerns? No   Items Reviewed:  Did the pt receive and understand the discharge instructions provided? Yes   Medications obtained and verified? Yes   Any new allergies since your discharge? No   Dietary orders reviewed? Yes  Do you have support at home? Yes   Other (ie: DME, Home Health, etc) [facility] ordered Home health but they have not been out yet.  Functional Questionnaire: (I = Independent and D = Dependent) ADL's: D  Bathing/Dressing- D   Meal Prep- D  Eating- I  Maintaining continence- I  Transferring/Ambulation- D Wheelchair  Managing Meds- I with assistance   Follow up appointments reviewed:    PCP Hospital f/u appt confirmed? Yes  Scheduled to see Dinah on 02/05/19 .  [facility] f/u appt confirmed? No    Are transportation arrangements needed? No   If their condition worsens, is the pt aware to call  their PCP or go to the ED? Yes  Was the patient provided with contact information for the PCP's office or ED? Yes  Was the pt encouraged to call back with questions or concerns? Yes

## 2019-02-05 ENCOUNTER — Ambulatory Visit (INDEPENDENT_AMBULATORY_CARE_PROVIDER_SITE_OTHER): Payer: PPO | Admitting: Family

## 2019-02-05 ENCOUNTER — Encounter: Payer: Self-pay | Admitting: Family

## 2019-02-05 ENCOUNTER — Other Ambulatory Visit: Payer: Self-pay

## 2019-02-05 DIAGNOSIS — M1A9XX1 Chronic gout, unspecified, with tophus (tophi): Secondary | ICD-10-CM

## 2019-02-05 DIAGNOSIS — J1282 Pneumonia due to coronavirus disease 2019: Secondary | ICD-10-CM

## 2019-02-05 DIAGNOSIS — U071 COVID-19: Secondary | ICD-10-CM

## 2019-02-05 DIAGNOSIS — I10 Essential (primary) hypertension: Secondary | ICD-10-CM | POA: Diagnosis not present

## 2019-02-05 DIAGNOSIS — Z794 Long term (current) use of insulin: Secondary | ICD-10-CM

## 2019-02-05 DIAGNOSIS — J449 Chronic obstructive pulmonary disease, unspecified: Secondary | ICD-10-CM

## 2019-02-05 DIAGNOSIS — N183 Chronic kidney disease, stage 3 unspecified: Secondary | ICD-10-CM

## 2019-02-05 DIAGNOSIS — F418 Other specified anxiety disorders: Secondary | ICD-10-CM | POA: Diagnosis not present

## 2019-02-05 DIAGNOSIS — IMO0002 Reserved for concepts with insufficient information to code with codable children: Secondary | ICD-10-CM

## 2019-02-05 DIAGNOSIS — E1121 Type 2 diabetes mellitus with diabetic nephropathy: Secondary | ICD-10-CM

## 2019-02-05 DIAGNOSIS — Z86711 Personal history of pulmonary embolism: Secondary | ICD-10-CM

## 2019-02-05 DIAGNOSIS — I5032 Chronic diastolic (congestive) heart failure: Secondary | ICD-10-CM | POA: Diagnosis not present

## 2019-02-05 DIAGNOSIS — K219 Gastro-esophageal reflux disease without esophagitis: Secondary | ICD-10-CM | POA: Diagnosis not present

## 2019-02-05 DIAGNOSIS — Z86718 Personal history of other venous thrombosis and embolism: Secondary | ICD-10-CM

## 2019-02-05 DIAGNOSIS — J1289 Other viral pneumonia: Secondary | ICD-10-CM

## 2019-02-05 DIAGNOSIS — G894 Chronic pain syndrome: Secondary | ICD-10-CM

## 2019-02-05 DIAGNOSIS — D638 Anemia in other chronic diseases classified elsewhere: Secondary | ICD-10-CM

## 2019-02-05 DIAGNOSIS — E1165 Type 2 diabetes mellitus with hyperglycemia: Secondary | ICD-10-CM

## 2019-02-05 DIAGNOSIS — E785 Hyperlipidemia, unspecified: Secondary | ICD-10-CM | POA: Diagnosis not present

## 2019-02-05 NOTE — Progress Notes (Signed)
This service is provided via telemedicine  No vital signs collected/recorded due to the encounter was a telemedicine visit.   Location of patient (ex: home, work):  Home  Patient consents to a telephone visit:  Yes  Location of the provider (ex: office, home):  Office  Name of any referring provider:  [name], NP  Names of all persons participating in the telemedicine service and their role in the encounter:  Bonney Leitz, [place]; Marlowe Sax, NP; [name], daughter [name]  Time spent on call:  14 minutes for CMA only   Provider: Dshawn Mcnay FNP-C  [name], NP  Patient Care Team: [name], NP as PCP - General (Geriatric Medicine) [name], [name], MD (Inactive) as Consulting Physician (Cardiology) [name], MD as Consulting Physician (Ophthalmology) [name], MD as Consulting Physician (General Surgery) [name], MD (Inactive) as Referring Physician (Obstetrics and Gynecology) [name] [name], MD as Consulting Physician (Ophthalmology)  Extended Emergency Contact Information Primary Emergency Contact: Gore,Pamela Address: [address]          [place], [place] [address] Johnnette Litter of [place] Phone: [phone] Mobile Phone: [phone] Relation: Daughter Secondary Emergency Contact: Lanny Cramp, [place] [address] Johnnette Litter of [address] Phone: [phone] Mobile Phone: [phone] Relation: Daughter  Code Status:  DNR Goals of care: Advanced Directive information Advanced Directives 01/24/2019  Does Patient Have a Medical Advance Directive? No;Yes  Type of Advance Directive Living will  Does patient want to make changes to medical advance directive? -  Copy of [place] in Chart? -  Would patient like information on creating a medical advance directive? -  Pre-existing out of facility DNR order (yellow form or pink MOST form) -     Chief Complaint  Patient presents  with  . [facility] 9/18-9/24/20    HPI:  Pt is a 84 y.o. female seen today for transition of care post hospital admission 01/24/2019 -01/30/2019 for shortness of breath,cough and chest pain.Her daughter had tested positive for COVID-19.she also tested positive for COVID-19.she was treated with oxygen,I.V Remdesivir and steroids x 5 days. Her EKG did not show signs of ischemia,Troponins was negative.Her chest pain was thought due to pneumonia.She was evaluated by Physical therapy who recommended skilled Nursing facility.   Hypertension- B/p stable.on Hydralazine 50 mg tablet twice daily,Imdur 60 mg 24 hr tablet daily and Metoprolol succinate 25 mg tablet daily.she denies any headache,dizziness,SOB or chest pain.No B/p readings for evaluation.    CKD stage 3/Hyperkalemia- Her CR remained at baseline 1.5- 1.8 she had a hypokalemia which required supplementation with much improvement.  Chronic diastolic Heart Failure - she had no fluid overload.she was continued on Furosmide 20 mg tablet as needed,Hydralazine 50 mg tablet twice daily,Imdur 60 mg 24 hr tablet daily and Metoprolol succinate 25 mg tablet daily   Type DM - latest Hgb A1C 11.7 she states CBG readings in the 200's has had some post pradial in the 400's. CBG were under control during hospitalization questioned whether she is complaint with her medication at home. Currently on Tujeo 40 units injection daily,Novolog Flexpen 14 units three times daily and Novolog sliding scale.   COPD - she denies any shortness of breath.on Proair 108 mcg /inhaler 1-2 puff every 6 hrs PRN and Symbicort 160 -4.5 mcg/act 2 puff twice daily.  Hyponatremia - suspected due to COVID-19.she was treated with IVF with much  improvement.  Depression /Anxiety - continued on Cymbalta 60 mg capsule daily and clonazepam 1 mg two tablets at bedtime as needed.       Hyperlipidemia - on  Pravastatin 20 mg tablet daily.discussed low  carbohydrates,low saturated fats and high vegetable diet.  Hx of DVT and PE - stable on Xarelto 10 mg tablet daily   Chronic pain syndrome - continues to require Percocet 10-325 mg tablet one by mouth every 6 hours as needed.  Anemia of chronic disease -  Hgb remained stable 8.9   Past Medical History:  Diagnosis Date  . Allergy    takes Mucinex daily as needed  . Anemia, unspecified   . Anxiety    takes Clonazepam daily as needed  . Arthritis   . Cancer (HCC)    breast  . CHF (congestive heart failure) ([place])    takes Furosemide daily  . CKD (chronic kidney disease)   . Coronary artery disease    a. s/p IMI 2004 tx with BMS to RCA;  b. s/p Promus DES to RCA 2/12 c. 06/2014 High risk stress test follow up cath 06/2014 showd patent stent--< med therapy for other mild to moedrate disease   . Coronary atherosclerosis of native coronary artery   . Depression    takes Cymbalta daily  . Diabetes mellitus    insulin daily  . Dysuria   . GERD (gastroesophageal reflux disease)    takes Protonix daily  . Gout, unspecified    takes Colchicine daily  . Headache    occasionally  . History of blood clots about 54yrs ago   in legs-takes Coumadin   . Hyperlipidemia    takes Pravastatin daily  . Hypertension    takes Imdur and Metoprolol daily  . Insomnia   . Joint pain   . Joint swelling   . Myocardial infarction ([place]) 2004  . Numbness   . Obstructive sleep apnea    does not wear cpap  . Osteoarthritis   . Osteoarthritis   . Osteoarthrosis, unspecified whether generalized or localized, lower leg   . Pain, chronic   . Polymyalgia rheumatica ([place])   . Pulmonary emboli ([place]) 9/13   felt to need lifelong anticoagulation  . Shortness of breath dyspnea    with exertion and has Albuterol inhaler prn  . Type II or unspecified type diabetes mellitus without mention of complication, uncontrolled   . Urinary incontinence    takes Linzess daily  . Vertigo    hx of;was taking Meclizine if  needed  . Wears dentures    Past Surgical History:  Procedure Laterality Date  . ABDOMINAL HYSTERECTOMY     partial  . APPENDECTOMY    . blood clots/legs and lungs  2013  . BREAST BIOPSY Left 07/22/2014  . BREAST BIOPSY Left 02/10/2013  . BREAST LUMPECTOMY Left 11/05/2014  . BREAST LUMPECTOMY WITH RADIOACTIVE SEED LOCALIZATION Left 11/05/2014   Procedure: LEFT BREAST LUMPECTOMY WITH RADIOACTIVE SEED LOCALIZATION;  Surgeon: [name], MD;  Location: [facility];  Service: General;  Laterality: Left;  . CARDIAC CATHETERIZATION    . COLONOSCOPY    . CORONARY ANGIOPLASTY  2  . ESOPHAGOGASTRODUODENOSCOPY (EGD) WITH PROPOFOL N/A 11/07/2016   Procedure: ESOPHAGOGASTRODUODENOSCOPY (EGD) WITH PROPOFOL;  Surgeon: Gatha Mayer, MD;  Location: [facility];  Service: Endoscopy;  Laterality: N/A;  . EXCISION OF SKIN TAG Right 11/05/2014   Procedure: EXCISION OF RIGHT EYELID SKIN TAG;  Surgeon: [name], MD;  Location: [facility];  Service: General;  Laterality: Right;  . EYE SURGERY Bilateral    cataract   . GASTRIC BYPASS  1977    reversed in 1979, [place] N/A 06/29/2014   Procedure: LEFT HEART CATHETERIZATION WITH CORONARY ANGIOGRAM;  Surgeon: Troy Sine, MD;  Location: [facility] CATH LAB;  Service: Cardiovascular;  Laterality: N/A;  . MEMBRANE PEEL Right 10/23/2018   Procedure: MEMBRANE PEEL;  Surgeon: Hurman Horn, MD;  Location: [place];  Service: Ophthalmology;  Laterality: Right;  . MI with stent placement  2004  . PARS PLANA VITRECTOMY Right 10/23/2018   Procedure: PARS PLANA VITRECTOMY WITH 25 GAUGE;  Surgeon: Hurman Horn, MD;  Location: [facility];  Service: Ophthalmology;  Laterality: Right;    Allergies  Allergen Reactions  . Sulfonamide Derivatives Swelling    Mouth swelling  . Aricept [Donepezil Hcl] Diarrhea and Nausea And Vomiting    GI upset/loose stools  . Tramadol Nausea And Vomiting    Outpatient Encounter  Medications as of 02/05/2019  Medication Sig  . albuterol (PROAIR HFA) 108 (90 Base) MCG/ACT inhaler Inhale 1-2 puffs into the lungs every 6 (six) hours as needed for wheezing or shortness of breath.  . anastrozole (ARIMIDEX) 1 MG tablet TAKE 1 TABLET BY MOUTH EVERY DAY  . BISACODYL PO Take by mouth daily as needed. Liquid  . budesonide-formoterol (SYMBICORT) 160-4.5 MCG/ACT inhaler Inhale 2 puffs into the lungs 2 (two) times daily. For bronchitis  . clonazePAM (KLONOPIN) 1 MG tablet Take two tablets by mouth at bedtime as needed for anxiety  . Continuous Blood Gluc Receiver (FREESTYLE LIBRE 14 DAY READER) DEVI 1 Device by Does not apply route 4 (four) times daily -  before meals and at bedtime. For diabetes  . Continuous Blood Gluc Sensor (FREESTYLE LIBRE 14 DAY SENSOR) MISC 1 Units by Does not apply route 4 (four) times daily -  before meals and at bedtime. For diabetes  . DULoxetine (CYMBALTA) 60 MG capsule Take one capsule by mouth once daily for anxiety  . febuxostat (ULORIC) 40 MG tablet Take 1 tablet (40 mg total) by mouth daily.  . furosemide (LASIX) 20 MG tablet Take 1 tablet (20 mg total) by mouth daily as needed.  . hydrALAZINE (APRESOLINE) 50 MG tablet TAKE 1 TABLET BY MOUTH 2 TIMES DAILY  . insulin aspart (NOVOLOG FLEXPEN) 100 UNIT/ML FlexPen Inject 14 Units into the skin 3 (three) times daily with meals. SSI:  181-210 = 1 unit 211-240 = 2 units 241-270 =  . Insulin Glargine, 2 Unit Dial, (TOUJEO MAX SOLOSTAR) 300 UNIT/ML SOPN Inject 40 Units into the skin daily. Patient assistance program provides  . isosorbide mononitrate (IMDUR) 60 MG 24 hr tablet Take one tablet by mouth once daily  . magnesium oxide (MAG-OX) 400 MG tablet Take 400 mg by mouth daily.  . meclizine (ANTIVERT) 25 MG tablet Take one tablet by mouth three times daily as needed for diziness  . memantine (NAMENDA) 5 MG tablet TAKE 2 TABLETS BY MOUTH EVERY MORNING and TAKE 1 TABLET BY MOUTH EVERY EVENING FOR MEMORY  .  metoprolol succinate (TOPROL-XL) 25 MG 24 hr tablet Take one tablet by mouth once daily for blood pressure  . nitroGLYCERIN (NITROSTAT) 0.4 MG SL tablet Take one tablet under the tongue every 5 minutes as needed for chest pain  . oxyCODONE-acetaminophen (PERCOCET) 10-325 MG tablet Take 1 tablet by mouth every 6 (six) hours.  . pantoprazole (PROTONIX) 40 MG tablet Take 1 tablet (  40 mg total) by mouth daily.  . pravastatin (PRAVACHOL) 20 MG tablet Take one tablet by mouth once daily for cholesterol  . sodium polystyrene (KAYEXALATE) 15 GM/60ML suspension 15 gm by mouth weekly to keep potassium down.  Alveda Reasons 10 MG TABS tablet TAKE 1 TABLET BY MOUTH EVERY DAY  . [DISCONTINUED] insulin aspart (NOVOLOG FLEXPEN) 100 UNIT/ML FlexPen Inject 12 Units into the skin 3 (three) times daily with meals. Max 45 units  . [DISCONTINUED] senna-docusate (SENOKOT-S) 8.6-50 MG tablet Take 1 tablet by mouth at bedtime.   No facility-administered encounter medications on file as of 02/05/2019.     Review of Systems  Constitutional: Negative for appetite change, chills, fatigue and fever.  HENT: Negative for congestion, sinus pressure, sinus pain, sneezing, sore throat and trouble swallowing.   Eyes: Negative for discharge, redness and itching.  Respiratory: Negative for chest tightness and shortness of breath.        COPD breathing has improved   Cardiovascular: Negative for chest pain, palpitations and leg swelling.  Gastrointestinal: Negative for abdominal distention, abdominal pain, constipation, diarrhea, nausea and vomiting.  Endocrine: Negative for cold intolerance, heat intolerance, polydipsia, polyphagia and polyuria.  Genitourinary: Negative for difficulty urinating, dysuria, flank pain, frequency and urgency.  Musculoskeletal: Positive for arthralgias and gait problem.  Skin: Negative for color change, pallor and rash.  Neurological: Negative for dizziness, light-headedness, numbness and headaches.   Hematological: Does not bruise/bleed easily.  Psychiatric/Behavioral: Negative for agitation and sleep disturbance.       Anxiety and depression stable on current medication     Immunization History  Administered Date(s) Administered  . Influenza Whole 03/14/2007, 02/13/2008, 01/18/2010  . Influenza, High Dose Seasonal PF 01/16/2017, 03/11/2018  . Influenza,inj,Quad PF,6+ Mos 01/08/2013, 02/18/2014, 01/18/2015, 01/24/2016  . Influenza-Unspecified 02/15/2012, 02/18/2014  . Pneumococcal Conjugate-13 02/27/2017  . Pneumococcal Polysaccharide-23 05/08/2002  . Td 05/08/2008   Pertinent  Health Maintenance Due  Topic Date Due  . OPHTHALMOLOGY EXAM  04/25/2017  . INFLUENZA VACCINE  12/07/2018  . FOOT EXAM  06/04/2019  . HEMOGLOBIN A1C  06/22/2019  . MAMMOGRAM  12/12/2019  . DEXA SCAN  Completed  . PNA vac Low Risk Adult  Completed   Fall Risk  12/18/2018 10/11/2018 08/29/2018 07/17/2018 06/03/2018  Falls in the past year? 0 0 0 0 0  Number falls in past yr: 0 0 0 0 0  Comment - - - - -  Injury with Fall? 0 0 0 0 0  Comment - - - - -  Risk Factor Category  - - - - -  Risk for fall due to : - - - - -  Follow up - - - - -   There were no vitals filed for this visit. There is no height or weight on file to calculate BMI.   Physical Exam Unable to complete on telephone visit.   Labs reviewed: Recent Labs    01/28/19 0220 01/29/19 0310 01/30/19 0240  NA 134* 134* 134*  K 5.2* 4.8 4.9  CL 102 100 102  CO2 22 23 24   GLUCOSE 225* 256* 246*  BUN 52* 49* 52*  CREATININE 1.38* 1.39* 1.31*  CALCIUM 8.7* 9.0 8.9   Recent Labs    01/28/19 0220 01/29/19 0310 01/30/19 0240  AST 19 18 17   ALT 14 14 16   ALKPHOS 57 63 61  BILITOT 0.5 0.2* 0.3  PROT 7.4 7.6 7.2  ALBUMIN 2.8* 3.0* 2.8*   Recent Labs    01/28/19 0220  01/29/19 0310 01/30/19 0240  WBC 9.5 8.9 11.1*  NEUTROABS 7.8* 6.7 8.1*  HGB 8.8* 9.6* 8.9*  HCT 28.0* 30.1* 27.5*  MCV 90.9 92.3 91.1  PLT 265 335 315    Lab Results  Component Value Date   TSH 0.884 10/09/2017   Lab Results  Component Value Date   HGBA1C 11.7 (A) 12/20/2018   Lab Results  Component Value Date   CHOL 90 07/17/2018   HDL 35 (L) 07/17/2018   LDLCALC 40 07/17/2018   TRIG 74 07/17/2018   CHOLHDL 2.6 07/17/2018    Significant Diagnostic Results in last 30 days:  Dg Chest 1 View  Result Date: 01/24/2019 CLINICAL DATA:  Shortness of breath EXAM: CHEST  1 VIEW COMPARISON:  August 27, 2018 FINDINGS: The heart size and mediastinal contours are within normal limits. There is mildly increased hazy airspace opacity seen at the bilateral lower lungs. Overall shallow degree of aeration. The visualized skeletal structures are unremarkable. IMPRESSION: Mildly increased hazy airspace opacity at both lower lungs which is non-specific could be due to atelectasis and/or early infectious etiology. Electronically Signed   By: Prudencio Pair M.D.   On: 01/24/2019 21:19    Assessment/Plan 1. Essential hypertension No blood pressure readings for review.continue on Hydralazine 50 mg tablet twice daily,Imdur 60 mg 24 hr tablet daily and Metoprolol succinate 25 mg tablet daily.  2. Chronic diastolic congestive heart failure ([place]) Reports no signs of fluid overload.continue on Furosmide 20 mg tablet as needed,Hydralazine 50 mg tablet twice daily,Imdur 60 mg 24 hr tablet daily and Metoprolol succinate 25 mg tablet daily.Recommeded fluid restriction.   3. Chronic obstructive pulmonary disease, unspecified COPD type ([place]) Reports breathing stable.continue on Proair 108 mcg /inhaler 1-2 puff every 6 hrs PRN,Symbicort 160 -4.5 mcg/act 2 puff twice daily,  4. Gastroesophageal reflux disease, esophagitis presence not specified Symptoms under control.continue on Protonix 40 mg tablet daily.   5. Uncontrolled type 2 diabetes mellitus with diabetic nephropathy, with long-term current use of insulin ([place]) Lab Results  Component Value Date   HGBA1C  11.7 (A) 12/20/2018  Her home CBG reviewed readings in the 200's with some readings in the 400's post pradial.discussed with patient to adhere to a low carbohydrate,low saturated fats,high vegetable diet and cut down on sugary foods such as juices/snacks.she drinks apple juice and diet Ginger ale.Encourage to each low sugar fruits instead of juices. Continue on  Tujeo 40 units injection daily,Novolog Flexpen 14 units three times daily and Novolog sliding scale.will need nutrition referral if CBG readings still high with Nutrition adjustment.  6. CKD (chronic kidney disease) stage 3, GFR 30-59 ml/min (HCC)  CR at her basline1.5- 1.8continue to avoid Nephrotoxins and dose for renal clearance. - CMP ,future   7. Hyperlipidemia, unspecified hyperlipidemia type Diet modification as above.continue on pravastatin 20 mg tablet daily.   8. Chronic pain syndrome Continue on Percocet 10-325 mg tablet one by mouth every 6 hours as needed.    9. Anxiety associated with depression Mood stable.continue on Cymbalta 60 mg capsule daily and clonazepam 1 mg two tablets at bedtime as needed.       10. History of deep venous thrombosis Denies any swelling or pain on the legs.continue on Xarelto 10 mg tablet daily   11. History of pulmonary embolus (PE) No shortness of breath continue on Xarelto 10 mg tablet daily   12. Chronic gout with tophus, unspecified cause, unspecified site No flare up reported continue on Uloric 40 mg tablet daily.  13. Pneumonia due to COVID-19 virus Symptoms have resolved.   14.Hypokalemia  repleted during hospitalization. CMP,future  15 Anemia of chronic disease  Hgb stable at the hospital.Recheck CBC/diff.  Family/ staff Communication: Reviewed plan of care with patient.  Labs/tests ordered: CBC/diff and CMP    Antiono Ettinger C Jenkins Risdon, NP

## 2019-02-12 ENCOUNTER — Other Ambulatory Visit: Payer: PPO

## 2019-02-12 ENCOUNTER — Ambulatory Visit (INDEPENDENT_AMBULATORY_CARE_PROVIDER_SITE_OTHER): Payer: PPO | Admitting: Family

## 2019-02-12 ENCOUNTER — Encounter: Payer: Self-pay | Admitting: Family

## 2019-02-12 ENCOUNTER — Other Ambulatory Visit: Payer: Self-pay

## 2019-02-12 VITALS — BP 122/78 | HR 78 | Temp 97.3°F | Resp 18 | Ht 67.0 in | Wt 323.2 lb

## 2019-02-12 DIAGNOSIS — J3489 Other specified disorders of nose and nasal sinuses: Secondary | ICD-10-CM

## 2019-02-12 DIAGNOSIS — N183 Chronic kidney disease, stage 3 unspecified: Secondary | ICD-10-CM | POA: Diagnosis not present

## 2019-02-12 DIAGNOSIS — I10 Essential (primary) hypertension: Secondary | ICD-10-CM

## 2019-02-12 DIAGNOSIS — G4700 Insomnia, unspecified: Secondary | ICD-10-CM | POA: Diagnosis not present

## 2019-02-12 DIAGNOSIS — Z23 Encounter for immunization: Secondary | ICD-10-CM

## 2019-02-12 DIAGNOSIS — R519 Headache, unspecified: Secondary | ICD-10-CM

## 2019-02-12 DIAGNOSIS — R42 Dizziness and giddiness: Secondary | ICD-10-CM

## 2019-02-12 DIAGNOSIS — D638 Anemia in other chronic diseases classified elsewhere: Secondary | ICD-10-CM | POA: Diagnosis not present

## 2019-02-12 DIAGNOSIS — J309 Allergic rhinitis, unspecified: Secondary | ICD-10-CM

## 2019-02-12 DIAGNOSIS — G8929 Other chronic pain: Secondary | ICD-10-CM | POA: Diagnosis not present

## 2019-02-12 LAB — COMPLETE METABOLIC PANEL WITH GFR
AG Ratio: 1 (calc) (ref 1.0–2.5)
ALT: 16 U/L (ref 6–29)
AST: 18 U/L (ref 10–35)
Albumin: 3.3 g/dL — ABNORMAL LOW (ref 3.6–5.1)
Alkaline phosphatase (APISO): 76 U/L (ref 37–153)
BUN/Creatinine Ratio: 20 (calc) (ref 6–22)
BUN: 31 mg/dL — ABNORMAL HIGH (ref 7–25)
CO2: 28 mmol/L (ref 20–32)
Calcium: 8.9 mg/dL (ref 8.6–10.4)
Chloride: 106 mmol/L (ref 98–110)
Creat: 1.52 mg/dL — ABNORMAL HIGH (ref 0.60–0.88)
GFR, Est African American: 37 mL/min/{1.73_m2} — ABNORMAL LOW (ref >=60)
GFR, Est Non African American: 32 mL/min/{1.73_m2} — ABNORMAL LOW (ref >=60)
Globulin: 3.3 g/dL (calc) (ref 1.9–3.7)
Glucose, Bld: 218 mg/dL — ABNORMAL HIGH (ref 65–99)
Potassium: 4.7 mmol/L (ref 3.5–5.3)
Sodium: 140 mmol/L (ref 135–146)
Total Bilirubin: 0.5 mg/dL (ref 0.2–1.2)
Total Protein: 6.6 g/dL (ref 6.1–8.1)

## 2019-02-12 LAB — CBC WITH DIFFERENTIAL/PLATELET
Absolute Monocytes: 547 cells/uL (ref 200–950)
Basophils Absolute: 22 cells/uL (ref 0–200)
Basophils Relative: 0.3 %
Eosinophils Absolute: 252 cells/uL (ref 15–500)
Eosinophils Relative: 3.5 %
HCT: 28 % — ABNORMAL LOW (ref 35.0–45.0)
Hemoglobin: 9.1 g/dL — ABNORMAL LOW (ref 11.7–15.5)
Lymphs Abs: 2203 cells/uL (ref 850–3900)
MCH: 29.3 pg (ref 27.0–33.0)
MCHC: 32.5 g/dL (ref 32.0–36.0)
MCV: 90 fL (ref 80.0–100.0)
MPV: 11.4 fL (ref 7.5–12.5)
Monocytes Relative: 7.6 %
Neutro Abs: 4176 cells/uL (ref 1500–7800)
Neutrophils Relative %: 58 %
Platelets: 200 10*3/uL (ref 140–400)
RBC: 3.11 10*6/uL — ABNORMAL LOW (ref 3.80–5.10)
RDW: 13.9 % (ref 11.0–15.0)
Total Lymphocyte: 30.6 %
WBC: 7.2 10*3/uL (ref 3.8–10.8)

## 2019-02-12 MED ORDER — TOPIRAMATE 25 MG PO TABS: 25.0000 mg | ORAL_TABLET | Freq: Every day | ORAL | 0 refills | Status: DC

## 2019-02-12 MED ORDER — LORATADINE 10 MG PO TABS: 10.0000 mg | ORAL_TABLET | Freq: Every day | ORAL | 3 refills | Status: DC

## 2019-02-12 MED ORDER — FLUTICASONE PROPIONATE 50 MCG/ACT NA SUSP: 2.0000 | Freq: Every day | NASAL | 6 refills | Status: DC

## 2019-02-12 NOTE — Progress Notes (Signed)
Provider: Dinah Ngetich FNP-C  [name], NP  Patient Care Team: [name], NP as PCP - General (Geriatric Medicine) [name], [name], MD (Inactive) as Consulting Physician (Cardiology) [name], MD as Consulting Physician (Ophthalmology) [name], MD as Consulting Physician (General Surgery) [name], MD (Inactive) as Referring Physician (Obstetrics and Gynecology) [name] [name], MD as Consulting Physician (Ophthalmology)  Extended Emergency Contact Information Primary Emergency Contact: Gore,Pamela Address: [address]          [place], [place] [address] Johnnette Litter of [place] Phone: [phone] Mobile Phone: [phone] Relation: Daughter Secondary Emergency Contact: Lanny Cramp, [place] [address] Johnnette Litter of [place] Phone: [phone] Mobile Phone: [phone] Relation: Daughter  Code Status:  Goals of care: Advanced Directive information Advanced Directives 01/24/2019  Does Patient Have a Medical Advance Directive? No;Yes  Type of Advance Directive Living will  Does patient want to make changes to medical advance directive? -  Copy of [place] in Chart? -  Would patient like information on creating a medical advance directive? -  Pre-existing out of facility DNR order (yellow form or pink MOST form) -     Chief Complaint  Patient presents with  . Acute Visit    Having headache since leaving hospital, dizziness with 2 - 3 week duration, and complains of eye pain.   . Medication Management    patient has been using nasal spray to help with no relief , patient would like to discuss clonzapam states it does not work    HPI:  Pt is a 84 y.o. female seen today for an acute visit for evaluation of headache since leaving hospital 01/30/2019 after admission for pneumonia due to COVID-19. She describes headache as frontal and top of the head.Also associated with eye pain and sinus pain.she  states painful to open eyes to light.Also has dizziness for 2 - 3 weeks.she has meclizine but has not taken it.  patient has been using nasal spray to help with no relief with her allergies.she also states nothing helps with her anxiety.she is currently on clonazepam 1 mg tablet.on Cymbalta 60 mg capsule daily.   Also here for previous ordered lab work CBC/diff and CMP    Past Medical History:  Diagnosis Date  . Allergy    takes Mucinex daily as needed  . Anemia, unspecified   . Anxiety    takes Clonazepam daily as needed  . Arthritis   . Cancer (HCC)    breast  . CHF (congestive heart failure) ([place])    takes Furosemide daily  . CKD (chronic kidney disease)   . Coronary artery disease    a. s/p IMI 2004 tx with BMS to RCA;  b. s/p Promus DES to RCA 2/12 c. 06/2014 High risk stress test follow up cath 06/2014 showd patent stent--< med therapy for other mild to moedrate disease   . Coronary atherosclerosis of native coronary artery   . Depression    takes Cymbalta daily  . Diabetes mellitus    insulin daily  . Dysuria   . GERD (gastroesophageal reflux disease)    takes Protonix daily  . Gout, unspecified    takes Colchicine daily  . Headache    occasionally  . History of blood clots about 31yr ago   in legs-takes Coumadin   . Hyperlipidemia    takes Pravastatin daily  . Hypertension    takes Imdur and Metoprolol daily  .  Insomnia   . Joint pain   . Joint swelling   . Myocardial infarction ([place]) 2004  . Numbness   . Obstructive sleep apnea    does not wear cpap  . Osteoarthritis   . Osteoarthritis   . Osteoarthrosis, unspecified whether generalized or localized, lower leg   . Pain, chronic   . Polymyalgia rheumatica ([place])   . Pulmonary emboli ([place]) 9/13   felt to need lifelong anticoagulation  . Shortness of breath dyspnea    with exertion and has Albuterol inhaler prn  . Type II or unspecified type diabetes mellitus without mention of complication, uncontrolled    . Urinary incontinence    takes Linzess daily  . Vertigo    hx of;was taking Meclizine if needed  . Wears dentures    Past Surgical History:  Procedure Laterality Date  . ABDOMINAL HYSTERECTOMY     partial  . APPENDECTOMY    . blood clots/legs and lungs  2013  . BREAST BIOPSY Left 07/22/2014  . BREAST BIOPSY Left 02/10/2013  . BREAST LUMPECTOMY Left 11/05/2014  . BREAST LUMPECTOMY WITH RADIOACTIVE SEED LOCALIZATION Left 11/05/2014   Procedure: LEFT BREAST LUMPECTOMY WITH RADIOACTIVE SEED LOCALIZATION;  Surgeon: [name], MD;  Location: [facility];  Service: General;  Laterality: Left;  . CARDIAC CATHETERIZATION    . COLONOSCOPY    . CORONARY ANGIOPLASTY  2  . ESOPHAGOGASTRODUODENOSCOPY (EGD) WITH PROPOFOL N/A 11/07/2016   Procedure: ESOPHAGOGASTRODUODENOSCOPY (EGD) WITH PROPOFOL;  Surgeon: Gatha Mayer, MD;  Location: [facility];  Service: Endoscopy;  Laterality: N/A;  . EXCISION OF SKIN TAG Right 11/05/2014   Procedure: EXCISION OF RIGHT EYELID SKIN TAG;  Surgeon: [name], MD;  Location: [facility];  Service: General;  Laterality: Right;  . EYE SURGERY Bilateral    cataract   . GASTRIC BYPASS  1977    reversed in 1979, [place] N/A 06/29/2014   Procedure: LEFT HEART CATHETERIZATION WITH CORONARY ANGIOGRAM;  Surgeon: Troy Sine, MD;  Location: [facility] CATH LAB;  Service: Cardiovascular;  Laterality: N/A;  . MEMBRANE PEEL Right 10/23/2018   Procedure: MEMBRANE PEEL;  Surgeon: Hurman Horn, MD;  Location: [facility];  Service: Ophthalmology;  Laterality: Right;  . MI with stent placement  2004  . PARS PLANA VITRECTOMY Right 10/23/2018   Procedure: PARS PLANA VITRECTOMY WITH 25 GAUGE;  Surgeon: Hurman Horn, MD;  Location: [facility];  Service: Ophthalmology;  Laterality: Right;    Allergies  Allergen Reactions  . Sulfonamide Derivatives Swelling    Mouth swelling  . Aricept [Donepezil Hcl] Diarrhea and Nausea And  Vomiting    GI upset/loose stools  . Tramadol Nausea And Vomiting    Outpatient Encounter Medications as of 02/12/2019  Medication Sig  . albuterol (PROAIR HFA) 108 (90 Base) MCG/ACT inhaler Inhale 1-2 puffs into the lungs every 6 (six) hours as needed for wheezing or shortness of breath.  . anastrozole (ARIMIDEX) 1 MG tablet TAKE 1 TABLET BY MOUTH EVERY DAY  . budesonide-formoterol (SYMBICORT) 160-4.5 MCG/ACT inhaler Inhale 2 puffs into the lungs 2 (two) times daily. For bronchitis  . clonazePAM (KLONOPIN) 1 MG tablet Take two tablets by mouth at bedtime as needed for anxiety  . Continuous Blood Gluc Receiver (FREESTYLE LIBRE 14 DAY READER) DEVI 1 Device by Does not apply route 4 (four) times daily -  before meals and at bedtime. For diabetes  . Continuous Blood Gluc Sensor (FREESTYLE LIBRE 14 DAY  SENSOR) MISC 1 Units by Does not apply route 4 (four) times daily -  before meals and at bedtime. For diabetes  . DULoxetine (CYMBALTA) 60 MG capsule Take one capsule by mouth once daily for anxiety  . febuxostat (ULORIC) 40 MG tablet Take 1 tablet (40 mg total) by mouth daily.  . furosemide (LASIX) 20 MG tablet Take 1 tablet (20 mg total) by mouth daily as needed.  . hydrALAZINE (APRESOLINE) 50 MG tablet TAKE 1 TABLET BY MOUTH 2 TIMES DAILY  . insulin aspart (NOVOLOG FLEXPEN) 100 UNIT/ML FlexPen Inject 14 Units into the skin 3 (three) times daily with meals. SSI:  181-210 = 1 unit 211-240 = 2 units 241-270 = 3 units 271-300 = 4 units 301-330 = 5 units 331-360 = 6 units 361-390 = 7 units  . Insulin Glargine, 2 Unit Dial, (TOUJEO MAX SOLOSTAR) 300 UNIT/ML SOPN Inject 40 Units into the skin daily. Patient assistance program provides  . isosorbide mononitrate (IMDUR) 60 MG 24 hr tablet Take one tablet by mouth once daily  . magnesium oxide (MAG-OX) 400 MG tablet Take 400 mg by mouth daily.  . meclizine (ANTIVERT) 25 MG tablet Take one tablet by mouth three times daily as needed for diziness  .  memantine (NAMENDA) 5 MG tablet TAKE 2 TABLETS BY MOUTH EVERY MORNING and TAKE 1 TABLET BY MOUTH EVERY EVENING FOR MEMORY  . metoprolol succinate (TOPROL-XL) 25 MG 24 hr tablet Take one tablet by mouth once daily for blood pressure  . nitroGLYCERIN (NITROSTAT) 0.4 MG SL tablet Take one tablet under the tongue every 5 minutes as needed for chest pain  . oxyCODONE-acetaminophen (PERCOCET) 10-325 MG tablet Take 1 tablet by mouth every 6 (six) hours.  . pantoprazole (PROTONIX) 40 MG tablet Take 1 tablet (40 mg total) by mouth daily.  . pravastatin (PRAVACHOL) 20 MG tablet Take one tablet by mouth once daily for cholesterol  . sodium polystyrene (KAYEXALATE) 15 GM/60ML suspension 15 gm by mouth weekly to keep potassium down.  Alveda Reasons 10 MG TABS tablet TAKE 1 TABLET BY MOUTH EVERY DAY  . [DISCONTINUED] BISACODYL PO Take by mouth daily as needed. Liquid   No facility-administered encounter medications on file as of 02/12/2019.     Review of Systems  Constitutional: Negative for appetite change, chills, fatigue and fever.  HENT: Positive for hearing loss. Negative for congestion, ear discharge, ear pain, rhinorrhea, sinus pressure, sinus pain, sneezing, sore throat and tinnitus.   Eyes: Negative for pain, discharge, redness and itching.  Respiratory: Negative for cough, chest tightness, shortness of breath and wheezing.   Cardiovascular: Negative for chest pain, palpitations and leg swelling.  Gastrointestinal: Negative for abdominal distention, abdominal pain, nausea and vomiting.  Endocrine: Negative for polydipsia, polyphagia and polyuria.  Musculoskeletal: Positive for arthralgias and gait problem.  Skin: Negative for color change, pallor and rash.  Neurological: Positive for dizziness and headaches. Negative for weakness.       Hx vertigo and peripheral neuropathy   Hematological: Does not bruise/bleed easily.  Psychiatric/Behavioral: Positive for sleep disturbance. The patient is  nervous/anxious.        Has hx of anxiety,depression     Immunization History  Administered Date(s) Administered  . Influenza Whole 03/14/2007, 02/13/2008, 01/18/2010  . Influenza, High Dose Seasonal PF 01/16/2017, 03/11/2018  . Influenza,inj,Quad PF,6+ Mos 01/08/2013, 02/18/2014, 01/18/2015, 01/24/2016  . Influenza-Unspecified 02/15/2012, 02/18/2014  . Pneumococcal Conjugate-13 02/27/2017  . Pneumococcal Polysaccharide-23 05/08/2002  . Td 05/08/2008   Pertinent  Health Maintenance  Due  Topic Date Due  . OPHTHALMOLOGY EXAM  04/25/2017  . INFLUENZA VACCINE  12/07/2018  . FOOT EXAM  06/04/2019  . HEMOGLOBIN A1C  06/22/2019  . MAMMOGRAM  12/12/2019  . DEXA SCAN  Completed  . PNA vac Low Risk Adult  Completed   Fall Risk  02/12/2019 12/18/2018 10/11/2018 08/29/2018 07/17/2018  Falls in the past year? 0 0 0 0 0  Number falls in past yr: 0 0 0 0 0  Comment - - - - -  Injury with Fall? 0 0 0 0 0  Comment - - - - -  Risk Factor Category  - - - - -  Risk for fall due to : - - - - -  Follow up - - - - -    Vitals:   02/12/19 1022  BP: 122/78  Pulse: 78  Temp: (!) 97.3 F (36.3 C)  TempSrc: Temporal  SpO2: 95%  Weight: (!) 323 lb 3.2 oz (146.6 kg)  Height: '5\' 7"'  (1.702 m)   Body mass index is 50.62 kg/m. Physical Exam Constitutional:      General: She is not in acute distress.    Appearance: She is obese. She is not ill-appearing.  HENT:     Head: Normocephalic.     Right Ear: Tympanic membrane, ear canal and external ear normal. There is no impacted cerumen.     Left Ear: Tympanic membrane, ear canal and external ear normal. There is no impacted cerumen.     Nose: Congestion present. No rhinorrhea.     Mouth/Throat:     Mouth: Mucous membranes are moist.     Pharynx: Oropharynx is clear. No oropharyngeal exudate or posterior oropharyngeal erythema.  Eyes:     General: No scleral icterus.       Right eye: No discharge.        Left eye: No discharge.      Conjunctiva/sclera: Conjunctivae normal.     Pupils: Pupils are equal, round, and reactive to light.     Comments: Unable to complete EOM due to sensitivity to light.  Neck:     Musculoskeletal: Normal range of motion. No neck rigidity or muscular tenderness.     Vascular: No carotid bruit.  Cardiovascular:     Rate and Rhythm: Normal rate and regular rhythm.     Pulses: Normal pulses.     Heart sounds: Normal heart sounds. No murmur. No friction rub. No gallop.   Pulmonary:     Effort: Pulmonary effort is normal. No respiratory distress.     Breath sounds: Normal breath sounds. No wheezing, rhonchi or rales.  Chest:     Chest wall: No tenderness.  Abdominal:     General: Bowel sounds are normal. There is no distension.     Palpations: Abdomen is soft. There is no mass.     Tenderness: There is no abdominal tenderness. There is no right CVA tenderness, left CVA tenderness, guarding or rebound.  Lymphadenopathy:     Cervical: No cervical adenopathy.  Skin:    General: Skin is warm.     Coloration: Skin is not pale.     Findings: No bruising or erythema.  Neurological:     Mental Status: She is alert. Mental status is at baseline.     Cranial Nerves: No cranial nerve deficit.     Motor: No weakness.     Coordination: Coordination normal.     Gait: Gait abnormal.     Comments: HOH  Psychiatric:        Mood and Affect: Mood normal.        Speech: Speech normal.        Behavior: Behavior is cooperative.        Thought Content: Thought content normal.        Cognition and Memory: Memory is impaired.    Labs reviewed: Recent Labs    01/28/19 0220 01/29/19 0310 01/30/19 0240  NA 134* 134* 134*  K 5.2* 4.8 4.9  CL 102 100 102  CO2 '22 23 24  ' GLUCOSE 225* 256* 246*  BUN 52* 49* 52*  CREATININE 1.38* 1.39* 1.31*  CALCIUM 8.7* 9.0 8.9   Recent Labs    01/28/19 0220 01/29/19 0310 01/30/19 0240  AST '19 18 17  ' ALT '14 14 16  ' ALKPHOS 57 63 61  BILITOT 0.5 0.2* 0.3  PROT  7.4 7.6 7.2  ALBUMIN 2.8* 3.0* 2.8*   Recent Labs    01/28/19 0220 01/29/19 0310 01/30/19 0240  WBC 9.5 8.9 11.1*  NEUTROABS 7.8* 6.7 8.1*  HGB 8.8* 9.6* 8.9*  HCT 28.0* 30.1* 27.5*  MCV 90.9 92.3 91.1  PLT 265 335 315   Lab Results  Component Value Date   TSH 0.884 10/09/2017   Lab Results  Component Value Date   HGBA1C 11.7 (A) 12/20/2018   Lab Results  Component Value Date   CHOL 90 07/17/2018   HDL 35 (L) 07/17/2018   LDLCALC 40 07/17/2018   TRIG 74 07/17/2018   CHOLHDL 2.6 07/17/2018    Significant Diagnostic Results in last 30 days:  Dg Chest 1 View  Result Date: 01/24/2019 CLINICAL DATA:  Shortness of breath EXAM: CHEST  1 VIEW COMPARISON:  August 27, 2018 FINDINGS: The heart size and mediastinal contours are within normal limits. There is mildly increased hazy airspace opacity seen at the bilateral lower lungs. Overall shallow degree of aeration. The visualized skeletal structures are unremarkable. IMPRESSION: Mildly increased hazy airspace opacity at both lower lungs which is non-specific could be due to atelectasis and/or early infectious etiology. Electronically Signed   By: Prudencio Pair M.D.   On: 01/24/2019 21:19    Assessment/Plan 1. Chronic nonintractable headache, unspecified headache type sensitivity to light with dizziness though has hx of vertigo.No Nausea or vomiting.Suspect possible effects of COVID-19 verse sinus headache.will refer to Neuro if symptoms persist.  - topiramate (TOPAMAX) 25 MG tablet; Take 1 tablet (25 mg total) by mouth daily.  Dispense: 30 tablet; Refill: 0  2. Frontal sinus pain Bilateral maxillary and frontal sinus tender with palpitation.No nasal drainage.Afebrile.No indication for antibiotics.  - loratadine (CLARITIN) 10 MG tablet; Take 1 tablet (10 mg total) by mouth daily.  Dispense: 30 tablet; Refill: 3  3. Dizziness Encouraged to take meclizine 25 mg tablet  three times daily as needed.   4. Allergic rhinitis, unspecified  seasonality, unspecified trigger Nasal congestion.saline spray ineffective.  - fluticasone (FLONASE) 50 MCG/ACT nasal spray; Place 2 sprays into both nostrils daily.  Dispense: 16 g; Refill: 6  5. Essential hypertension B/p well controlled.continue on current medication. - CMP with eGFR([facility]) - CBC with Differential/Platelet 6. CKD (chronic kidney disease) stage 3, GFR 30-59 ml/min Previous CR 1.31 during hospital admission for COVID-19.  - CMP with eGFR([facility])  7. Anemia of chronic disease Latest Hgb 8.9 during hospital admission with COVID-19  - CBC with Differential/Platelet  8. Need for influenza vaccination Afebrile.No signs of URI's  - Flu Vaccine QUAD High Dose(Fluad) administered by CMA  9. Insomnia, unspecified type still sleeping with her TV on blue light effect discussed with patient.encouraged Sleep hygiene. Continue on melatonin and Clonazepam.   Family/ staff Communication: Reviewed plan of care with patient and sister.   Labs/tests ordered: Has previous lab orders.   Sandrea Hughs, NP

## 2019-02-17 ENCOUNTER — Other Ambulatory Visit: Payer: Self-pay | Admitting: Nurse Practitioner

## 2019-02-17 ENCOUNTER — Other Ambulatory Visit: Payer: Self-pay | Admitting: *Deleted

## 2019-02-17 ENCOUNTER — Other Ambulatory Visit: Payer: Self-pay | Admitting: Family

## 2019-02-17 DIAGNOSIS — R413 Other amnesia: Secondary | ICD-10-CM

## 2019-02-17 DIAGNOSIS — G8929 Other chronic pain: Secondary | ICD-10-CM

## 2019-02-17 DIAGNOSIS — G894 Chronic pain syndrome: Secondary | ICD-10-CM

## 2019-02-17 NOTE — Telephone Encounter (Signed)
Patient requested refill [place]CCSRS Database Verified LR: 01/20/2019 Pended Rx and sent to [facility] for approval.

## 2019-02-17 NOTE — Telephone Encounter (Signed)
Pt got 120 tablets on 9/14 and she was in the hospital for 6 day, therefore she should NOT need a refill until 10/20.

## 2019-02-18 NOTE — Telephone Encounter (Signed)
Daughter notified and agreed.  I will pend Rx for approval 02/25/2019

## 2019-02-25 NOTE — Telephone Encounter (Signed)
We will refill this time (on Friday since she an additional 3 days by the hospital physician) but reminder that she did sign a contract and narcotics should only be coming from our office

## 2019-02-25 NOTE — Telephone Encounter (Signed)
It appears she filled the prescription given by the hospital for the same medication, for a 3 day supple. We needs to make sure she has an active pain contract. She can only get pain medication by ONE practice

## 2019-02-25 NOTE — Telephone Encounter (Signed)
Patient called regarding Rx. Patient stated that she did have that Rx filled written by the hospital provider. I explained that she signed a Narcotic Contract with our office that she would not get Narcotics from another provider. I told her that I was waiting for a response from you regarding refilling the medication. She agreed.

## 2019-02-25 NOTE — Telephone Encounter (Signed)
Pended Rx and sent to [facility] for approval.

## 2019-02-25 NOTE — Addendum Note (Signed)
Addended by: Rafael Bihari A on: 02/25/2019 10:05 AM   Modules accepted: Orders

## 2019-02-25 NOTE — Telephone Encounter (Signed)
Narcotic Contract was signed by patient 03/2015.

## 2019-02-26 DIAGNOSIS — L602 Onychogryphosis: Secondary | ICD-10-CM | POA: Diagnosis not present

## 2019-02-26 DIAGNOSIS — M792 Neuralgia and neuritis, unspecified: Secondary | ICD-10-CM | POA: Diagnosis not present

## 2019-02-26 DIAGNOSIS — E1351 Other specified diabetes mellitus with diabetic peripheral angiopathy without gangrene: Secondary | ICD-10-CM | POA: Diagnosis not present

## 2019-02-28 MED ORDER — OXYCODONE-ACETAMINOPHEN 10-325 MG PO TABS: 1.0000 | ORAL_TABLET | Freq: Four times a day (QID) | ORAL | 0 refills | Status: DC

## 2019-02-28 NOTE — Addendum Note (Signed)
Addended by: Rafael Bihari A on: 02/28/2019 10:01 AM   Modules accepted: Orders

## 2019-02-28 NOTE — Telephone Encounter (Signed)
Pended Rx and sent to [facility] for approval.

## 2019-03-04 ENCOUNTER — Telehealth: Payer: Self-pay

## 2019-03-04 ENCOUNTER — Telehealth: Payer: Self-pay | Admitting: *Deleted

## 2019-03-04 MED ORDER — TOUJEO MAX SOLOSTAR 300 UNIT/ML ~~LOC~~ SOPN: 40.0000 [IU] | PEN_INJECTOR | Freq: Every day | SUBCUTANEOUS | 11 refills | Status: DC

## 2019-03-04 NOTE — Telephone Encounter (Signed)
Patient called and requested we print off and fill out the patient assistance forms from [place] for her [place]. Printed forms off of website and filled out with verbal consent from patient.  Placed in [place] folder to review and sign. To be faxed to [employer] once completed. Fax: [phone]

## 2019-03-04 NOTE — Telephone Encounter (Signed)
[name], patients daughter walked in asking to speak with me regarding her mothers patient assistance forms for [employer].  Patient is now under the care of the endocrinologist for DM management and [name] asked if I had a blank copy of the patient assistance forms for the endocrinologist to complete.  I accessed [place] patient assistance from from a Producer, television/film/video. I provided [name] with a blank copy as requested and gave her a copy of the form we completed last year as a reference.  [name] appeared grateful and this encounter was handled to her apparent satisfaction.

## 2019-03-04 NOTE — Telephone Encounter (Signed)
MEDICATION: Insulin Glargine, 2 Unit Dial, (TOUJEO MAX SOLOSTAR) 300 UNIT/ML SOPN  PHARMACY:  [facility] A 90 DAY SUPPLY :   IS PATIENT OUT OF MEDICATION: no  IF NOT; HOW MUCH IS LEFT:   LAST APPOINTMENT DATE: @8 /17/2020  NEXT APPOINTMENT DATE:@11 /16/2020  DO WE HAVE YOUR PERMISSION TO LEAVE A DETAILED MESSAGE:  OTHER COMMENTS:    **Let patient know to contact pharmacy at the end of the day to make sure medication is ready. **  ** Please notify patient to allow 48-72 hours to process**  **Encourage patient to contact the pharmacy for refills or they can request refills through [facility]**

## 2019-03-04 NOTE — Telephone Encounter (Signed)
RX sent

## 2019-03-05 ENCOUNTER — Telehealth: Payer: Self-pay

## 2019-03-05 NOTE — Telephone Encounter (Signed)
[employer] patient assistance paperwork came in from [employer]- I tried to contact patient to make her aware she will need to fill out her portion and give financial statements verifying yearly income- could not leave a VM on the number in chart will try again later-forms are on Dr. Nathaneil Canary desk awaiting signature

## 2019-03-10 NOTE — Telephone Encounter (Signed)
spokw with daughter [name] and she is coming in to fill out patient assistance with the patient- I made her aware that she will also need to bring in [place] or something showing income from last year-I let her know the paperwork will be up front

## 2019-03-17 ENCOUNTER — Other Ambulatory Visit: Payer: Self-pay | Admitting: Nurse Practitioner

## 2019-03-17 ENCOUNTER — Other Ambulatory Visit: Payer: Self-pay | Admitting: Hematology

## 2019-03-20 ENCOUNTER — Other Ambulatory Visit: Payer: Self-pay

## 2019-03-21 DIAGNOSIS — I129 Hypertensive chronic kidney disease with stage 1 through stage 4 chronic kidney disease, or unspecified chronic kidney disease: Secondary | ICD-10-CM | POA: Diagnosis not present

## 2019-03-21 DIAGNOSIS — E1122 Type 2 diabetes mellitus with diabetic chronic kidney disease: Secondary | ICD-10-CM | POA: Diagnosis not present

## 2019-03-21 DIAGNOSIS — I2510 Atherosclerotic heart disease of native coronary artery without angina pectoris: Secondary | ICD-10-CM | POA: Diagnosis not present

## 2019-03-21 DIAGNOSIS — Z6841 Body Mass Index (BMI) 40.0 and over, adult: Secondary | ICD-10-CM | POA: Diagnosis not present

## 2019-03-21 DIAGNOSIS — N189 Chronic kidney disease, unspecified: Secondary | ICD-10-CM | POA: Diagnosis not present

## 2019-03-21 DIAGNOSIS — G4733 Obstructive sleep apnea (adult) (pediatric): Secondary | ICD-10-CM | POA: Diagnosis not present

## 2019-03-21 DIAGNOSIS — E875 Hyperkalemia: Secondary | ICD-10-CM | POA: Diagnosis not present

## 2019-03-21 DIAGNOSIS — D631 Anemia in chronic kidney disease: Secondary | ICD-10-CM | POA: Diagnosis not present

## 2019-03-21 DIAGNOSIS — N2581 Secondary hyperparathyroidism of renal origin: Secondary | ICD-10-CM | POA: Diagnosis not present

## 2019-03-21 DIAGNOSIS — N183 Chronic kidney disease, stage 3 unspecified: Secondary | ICD-10-CM | POA: Diagnosis not present

## 2019-03-21 DIAGNOSIS — U071 COVID-19: Secondary | ICD-10-CM | POA: Diagnosis not present

## 2019-03-21 DIAGNOSIS — I5030 Unspecified diastolic (congestive) heart failure: Secondary | ICD-10-CM | POA: Diagnosis not present

## 2019-03-24 ENCOUNTER — Encounter: Payer: Self-pay | Admitting: Internal Medicine

## 2019-03-24 ENCOUNTER — Ambulatory Visit: Payer: PPO | Admitting: Internal Medicine

## 2019-03-24 ENCOUNTER — Other Ambulatory Visit: Payer: Self-pay | Admitting: Family

## 2019-03-24 ENCOUNTER — Other Ambulatory Visit: Payer: Self-pay

## 2019-03-24 VITALS — BP 126/72 | HR 81 | Resp 18 | Ht 67.0 in | Wt 330.0 lb

## 2019-03-24 DIAGNOSIS — E1121 Type 2 diabetes mellitus with diabetic nephropathy: Secondary | ICD-10-CM | POA: Diagnosis not present

## 2019-03-24 DIAGNOSIS — G8929 Other chronic pain: Secondary | ICD-10-CM

## 2019-03-24 DIAGNOSIS — N1832 Chronic kidney disease, stage 3b: Secondary | ICD-10-CM

## 2019-03-24 DIAGNOSIS — Z794 Long term (current) use of insulin: Secondary | ICD-10-CM | POA: Diagnosis not present

## 2019-03-24 DIAGNOSIS — E1165 Type 2 diabetes mellitus with hyperglycemia: Secondary | ICD-10-CM | POA: Diagnosis not present

## 2019-03-24 DIAGNOSIS — E1140 Type 2 diabetes mellitus with diabetic neuropathy, unspecified: Secondary | ICD-10-CM

## 2019-03-24 DIAGNOSIS — R519 Headache, unspecified: Secondary | ICD-10-CM

## 2019-03-24 LAB — POCT GLYCOSYLATED HEMOGLOBIN (HGB A1C): Hemoglobin A1C: 9.8 % — AB (ref 4.0–5.6)

## 2019-03-24 MED ORDER — FREESTYLE LIBRE 14 DAY READER DEVI: 1.0000 | 0 refills | Status: DC

## 2019-03-24 NOTE — Progress Notes (Addendum)
Name: [name]  Age/ Sex: 84 y.o., female   MRN/ DOB: [id number], 01-12-1935     PCP: [name], NP   Reason for Endocrinology Evaluation: Type 2 Diabetes Mellitus     Initial Endocrinology Clinic Visit: 08/19/2018    PATIENT IDENTIFIER: [name] is a 84 y.o. female with a past medical history of T2DM, CKD III, and chronic pain syndrome . The patient has followed with Endocrinology clinic since 08/19/2018 for consultative assistance with management of her diabetes.  DIABETIC HISTORY:  [name] was diagnosed with T2DM > 40 yrs ago. Pt is a poor historian and unable to recall oral glycemic agents. She has been on insulin therapy for years. Her hemoglobin A1c has ranged from 6.7%  in 07/2017, peaking at 12.1% in 2018.  SUBJECTIVE:   During the last visit (12/20/2018): Her A1c was 8.3%. We decreased Toujeo to once a day dosin and Increased Novolog to 10 units daily.   Today (03/25/2019): [name] is here for a 3 month follow up visit on diabetes management. She is accompanied by her daughter  Ledon Snare states the sister is the care giver who has been checking mother's glucose and she is also the one in charge of administering her glucose. Pt has the  freestyle libre. Her sister has been the new caretaker since 10/2018. The patient has not had hypoglycemic episodes since the last clinic visit.    ROS: As per HPI and as detailed below: Review of Systems  Constitutional: Negative for chills and fever.  HENT: Negative for congestion and sore throat.   Respiratory: Positive for cough and shortness of breath.   Cardiovascular: Negative for chest pain and leg swelling.  Gastrointestinal: Negative for diarrhea and nausea.  Genitourinary: Negative for frequency.  Endo/Heme/Allergies: Negative for polydipsia.      HOME DIABETES REGIMEN:   Toujeo 40 units daily   Novolog 14 units TID   CF: BG -125/30 with meals     GLUCOSE LOG:  135- 353 mg/dL       HISTORY:  Past Medical History:  Past Medical History:  Diagnosis Date  . Allergy    takes Mucinex daily as needed  . Anemia, unspecified   . Anxiety    takes Clonazepam daily as needed  . Arthritis   . Cancer (HCC)    breast  . CHF (congestive heart failure) ([place])    takes Furosemide daily  . CKD (chronic kidney disease)   . Coronary artery disease    a. s/p IMI 2004 tx with BMS to RCA;  b. s/p Promus DES to RCA 2/12 c. 06/2014 High risk stress test follow up cath 06/2014 showd patent stent--< med therapy for other mild to moedrate disease   . Coronary atherosclerosis of native coronary artery   . Depression    takes Cymbalta daily  . Diabetes mellitus    insulin daily  . Dysuria   . GERD (gastroesophageal reflux disease)    takes Protonix daily  . Gout, unspecified    takes Colchicine daily  . Headache    occasionally  . History of blood clots about 51yrs ago   in legs-takes Coumadin   . Hyperlipidemia    takes Pravastatin daily  . Hypertension    takes Imdur and Metoprolol daily  . Insomnia   . Joint pain   . Joint swelling   . Myocardial infarction ([place]) 2004  . Numbness   . Obstructive sleep apnea  does not wear cpap  . Osteoarthritis   . Osteoarthritis   . Osteoarthrosis, unspecified whether generalized or localized, lower leg   . Pain, chronic   . Polymyalgia rheumatica ([place])   . Pulmonary emboli ([place]) 9/13   felt to need lifelong anticoagulation  . Shortness of breath dyspnea    with exertion and has Albuterol inhaler prn  . Type II or unspecified type diabetes mellitus without mention of complication, uncontrolled   . Urinary incontinence    takes Linzess daily  . Vertigo    hx of;was taking Meclizine if needed  . Wears dentures    Past Surgical History:  Past Surgical History:  Procedure Laterality Date  . ABDOMINAL HYSTERECTOMY     partial  . APPENDECTOMY    . blood clots/legs and lungs  2013  . BREAST BIOPSY Left 07/22/2014  . BREAST  BIOPSY Left 02/10/2013  . BREAST LUMPECTOMY Left 11/05/2014  . BREAST LUMPECTOMY WITH RADIOACTIVE SEED LOCALIZATION Left 11/05/2014   Procedure: LEFT BREAST LUMPECTOMY WITH RADIOACTIVE SEED LOCALIZATION;  Surgeon: [name], MD;  Location: [facility];  Service: General;  Laterality: Left;  . CARDIAC CATHETERIZATION    . COLONOSCOPY    . CORONARY ANGIOPLASTY  2  . ESOPHAGOGASTRODUODENOSCOPY (EGD) WITH PROPOFOL N/A 11/07/2016   Procedure: ESOPHAGOGASTRODUODENOSCOPY (EGD) WITH PROPOFOL;  Surgeon: Gatha Mayer, MD;  Location: [facility];  Service: Endoscopy;  Laterality: N/A;  . EXCISION OF SKIN TAG Right 11/05/2014   Procedure: EXCISION OF RIGHT EYELID SKIN TAG;  Surgeon: [name], MD;  Location: [facility];  Service: General;  Laterality: Right;  . EYE SURGERY Bilateral    cataract   . GASTRIC BYPASS  1977    reversed in 1979, [place] N/A 06/29/2014   Procedure: LEFT HEART CATHETERIZATION WITH CORONARY ANGIOGRAM;  Surgeon: Troy Sine, MD;  Location: [facility] CATH LAB;  Service: Cardiovascular;  Laterality: N/A;  . MEMBRANE PEEL Right 10/23/2018   Procedure: MEMBRANE PEEL;  Surgeon: Hurman Horn, MD;  Location: [facility];  Service: Ophthalmology;  Laterality: Right;  . MI with stent placement  2004  . PARS PLANA VITRECTOMY Right 10/23/2018   Procedure: PARS PLANA VITRECTOMY WITH 25 GAUGE;  Surgeon: Hurman Horn, MD;  Location: [facility];  Service: Ophthalmology;  Laterality: Right;    Social History:  reports that she has never smoked. She has never used smokeless tobacco. She reports that she does not drink alcohol or use drugs. Family History:  Family History  Problem Relation Age of Onset  . Breast cancer Mother 26  . Heart disease Mother   . Throat cancer Father   . Hypertension Father   . Arthritis Father   . Diabetes Father   . Arthritis Sister   . Obesity Sister   . Diabetes Sister   . Heart disease Cousin   . Colon  cancer Neg Hx   . Stomach cancer Neg Hx   . Esophageal cancer Neg Hx      HOME MEDICATIONS: Allergies as of 03/24/2019      Reactions   Sulfonamide Derivatives Swelling   Mouth swelling   Aricept [donepezil Hcl] Diarrhea, Nausea And Vomiting   GI upset/loose stools   Tramadol Nausea And Vomiting      Medication List       Accurate as of March 24, 2019 11:59 PM. If you have any questions, ask your nurse or doctor.  STOP taking these medications   magnesium oxide 400 MG tablet Commonly known as: MAG-OX Stopped by: Dorita Sciara, MD     TAKE these medications   albuterol 108 (90 Base) MCG/ACT inhaler Commonly known as: ProAir HFA Inhale 1-2 puffs into the lungs every 6 (six) hours as needed for wheezing or shortness of breath.   anastrozole 1 MG tablet Commonly known as: ARIMIDEX TAKE 1 TABLET BY MOUTH EVERY DAY   budesonide-formoterol 160-4.5 MCG/ACT inhaler Commonly known as: SYMBICORT Inhale 2 puffs into the lungs 2 (two) times daily. For bronchitis   clonazePAM 1 MG tablet Commonly known as: KLONOPIN Take two tablets by mouth at bedtime as needed for anxiety   DULoxetine 60 MG capsule Commonly known as: CYMBALTA TAKE 1 CAPSULE BY MOUTH EVERY DAY FOR ANXIETY   febuxostat 40 MG tablet Commonly known as: ULORIC TAKE 1 TABLET BY MOUTH EVERY DAY   fluticasone 50 MCG/ACT nasal spray Commonly known as: FLONASE Place 2 sprays into both nostrils daily.   FreeStyle Libre 14 Day Reader [name] 1 Device by Does not apply route 4 (four) times daily -  before meals and at bedtime. For diabetes What changed: Another medication with the same name was added. Make sure you understand how and when to take each. Changed by: Dorita Sciara, MD   FreeStyle Libre 14 Day Reader [facility] 1 Device by Does not apply route as directed. What changed: You were already taking a medication with the same name, and this prescription was added. Make sure you understand  how and when to take each. Changed by: Dorita Sciara, MD   FreeStyle Libre 14 Day Sensor Misc 1 Units by Does not apply route 4 (four) times daily -  before meals and at bedtime. For diabetes   furosemide 20 MG tablet Commonly known as: LASIX TAKE 1 TABLET BY MOUTH EVERY DAY AS NEEDED   hydrALAZINE 50 MG tablet Commonly known as: APRESOLINE TAKE 1 TABLET BY MOUTH 2 TIMES DAILY   isosorbide mononitrate 60 MG 24 hr tablet Commonly known as: IMDUR TAKE 1 TABLET BY MOUTH EVERY DAY   loratadine 10 MG tablet Commonly known as: CLARITIN Take 1 tablet (10 mg total) by mouth daily.   meclizine 25 MG tablet Commonly known as: ANTIVERT Take one tablet by mouth three times daily as needed for diziness   memantine 5 MG tablet Commonly known as: NAMENDA TAKE 2 TABLETS BY MOUTH EVERY MORNING and TAKE 1 TABLET BY MOUTH EVERY EVENING FOR MEMORY   metoprolol succinate 25 MG 24 hr tablet Commonly known as: TOPROL-XL TAKE 1 TABLET BY MOUTH EVERY DAY FOR BLOOD PRESSURE   nitroGLYCERIN 0.4 MG SL tablet Commonly known as: Nitrostat Take one tablet under the tongue every 5 minutes as needed for chest pain   NovoLOG FlexPen 100 UNIT/ML FlexPen Generic drug: insulin aspart Inject 14 Units into the skin 3 (three) times daily with meals. SSI:  181-210 = 1 unit 211-240 = 2 units 241-270 = 3 units 271-300 = 4 units 301-330 = 5 units 331-360 = 6 units 361-390 = 7 units   oxyCODONE-acetaminophen 10-325 MG tablet Commonly known as: PERCOCET Take 1 tablet by mouth every 6 (six) hours.   pantoprazole 40 MG tablet Commonly known as: PROTONIX Take 1 tablet (40 mg total) by mouth daily.   pravastatin 20 MG tablet Commonly known as: PRAVACHOL TAKE 1 TABLET BY MOUTH EVERY DAY FOR cholesterol   sodium polystyrene 15 GM/60ML suspension Commonly known as: KAYEXALATE  15 gm by mouth weekly to keep potassium down.   topiramate 25 MG tablet Commonly known as: TOPAMAX TAKE 1 TABLET BY MOUTH  DAILY   Toujeo Max SoloStar 300 UNIT/ML Sopn Generic drug: Insulin Glargine (2 Unit Dial) Inject 40 Units into the skin daily. Patient assistance program provides   Xarelto 10 MG Tabs tablet Generic drug: rivaroxaban TAKE 1 TABLET BY MOUTH EVERY DAY       PHYSICAL EXAM: VS: BP 126/72   Pulse 81   Resp 18   Ht 5\' 7"  (1.702 m)   Wt (!) 330 lb (149.7 kg)   SpO2 97%   BMI 51.69 kg/m    EXAM: General: Pt appears well and is in NAD in a wheelchair   Neck: General: Supple without adenopathy. Thyroid: Thyroid size normal.  No goiter or nodules appreciated. No thyroid bruit.  Lungs: Clear with good BS bilat with no rales, rhonchi, or wheezes  Heart: Auscultation: RRR with normal S1 and S2 Periph. circulation: no peripheral edema  Abdomen: Normoactive bowel sounds, soft, nontender, without masses or organomegaly palpable  Lymphatics:   Extremities:  BL LE: no pretibial edema normal ROM and strength, no joint enlargement or tenderness  Neuro: Cranial nerves: II - XII grossly intact ;  Motor: normal strength throughout DTRs: 2+ and symmetric in UE without delay in relaxation phase  Mental Status: Judgment, insight: intact Memory: intact for recent and remote events Mood and affect: no depression, anxiety, or agitation     DATA REVIEWED:  Lab Results  Component Value Date   HGBA1C 9.8 (A) 03/24/2019   HGBA1C 11.7 (A) 12/20/2018   HGBA1C 8.3 (H) 07/17/2018   Lab Results  Component Value Date   MICROALBUR 2.9 06/14/2017   LDLCALC 40 07/17/2018   CREATININE 1.52 (H) 02/12/2019   Lab Results  Component Value Date   MICRALBCREAT 81 (H) 06/14/2017     Lab Results  Component Value Date   CHOL 90 07/17/2018   HDL 35 (L) 07/17/2018   LDLCALC 40 07/17/2018   TRIG 74 07/17/2018   CHOLHDL 2.6 07/17/2018         ASSESSMENT / PLAN / RECOMMENDATIONS:   1)Type 2 Diabetes Mellitus, Poorly Controlled, With CKD III and macrovascular complications - Most recent A1c of 9.8 %.  Goal A1c < 7.5 %.     - Poorly controlled diabetes  due to dietary indiscretions and medication non-complianced, she loves to snack, historically she has declined prandial doses. Sister is the main care giver, I have advised daughter that the main caregiver should be present for future visit.  - Unable to download CGM, daughter states sister is the one that has the data, she did bring a glucose log but we discussed that CGM downloads are better.  - There's huge glucose fluctuations with BG 's in the low 100's and higher then 300's. This is indicative of missing prandial doses.   MEDICATIONS:  Increase Toujeo to 44  units daily   Increase Novolog to 16 units TID QAC  CF : BG- 150/30  EDUCATION / INSTRUCTIONS:  BG monitoring instructions: Patient is instructed to check her blood sugars 4 times a day, before meals and bedtime.  Call [facility] if: BG persistently < 70 or > 300. . I reviewed the Rule of 15 for the treatment of hypoglycemia in detail with the patient. Literature supplied.    F/U in 3 months    Signed electronically by: Mack Guise, MD  [facility]  [facility] [address], [place], [place] [address] Phone: [phone] FAX: [phone]   CC: [name], NP [place] [address] Phone: [phone]  Fax: [phone]  Return to Endocrinology clinic as below: Future Appointments  Date Time Provider [place]  04/14/2019  1:30 PM [name], NP [facility] None  06/11/2019 11:30 AM [name], NP [facility] None  06/25/2019  1:00 PM Shamleffer, [name], MD [facility] None  06/26/2019 11:30 AM [facility] LAB 1 [facility] None  06/26/2019 12:00 PM [name], MD [facility] None  07/23/2019  1:30 PM Burtis Junes, NP [facility]

## 2019-03-24 NOTE — Patient Instructions (Signed)
-   IncreaseToujeo / Tyler Aas to 44 units  - Increase Novolog at 16 units with Breakfast, Lunch and Dinner   - [employer] insulin: ADD extra units on insulin to your meal-time 14 units  dose if your blood sugars are higher than 180. Use the scale below to help guide you:   Blood sugar before meal Number of units to inject  Less than 180 0 unit  181 -  210 1 units  211 -  240 2 units  241 -  270 3 units  271 -  300 4 units  301 -  330 5 units  331 -  360 6 units  361 -  390 7 units    Choose healthy, lower carb lower calorie snacks: toss salad, cooked vegetables, cottage cheese, peanut butter, low fat cheese / string cheese, lower sodium deli meat, tuna salad or chicken salad    HOW TO TREAT LOW BLOOD SUGARS (Blood sugar LESS THAN 70 MG/DL)  Please follow the RULE OF 15 for the treatment of hypoglycemia treatment (when your (blood sugars are less than 70 mg/dL)    STEP 1: Take 15 grams of carbohydrates when your blood sugar is low, which includes:   3-4 GLUCOSE TABS  OR  3-4 OZ OF JUICE OR REGULAR SODA OR  ONE TUBE OF GLUCOSE GEL     STEP 2: RECHECK blood sugar in 15 MINUTES STEP 3: If your blood sugar is still low at the 15 minute recheck --> then, go back to STEP 1 and treat AGAIN with another 15 grams of carbohydrates.

## 2019-03-25 ENCOUNTER — Encounter: Payer: Self-pay | Admitting: Internal Medicine

## 2019-03-25 DIAGNOSIS — Z794 Long term (current) use of insulin: Secondary | ICD-10-CM | POA: Insufficient documentation

## 2019-03-25 DIAGNOSIS — E1165 Type 2 diabetes mellitus with hyperglycemia: Secondary | ICD-10-CM | POA: Insufficient documentation

## 2019-03-25 MED ORDER — TOUJEO MAX SOLOSTAR 300 UNIT/ML ~~LOC~~ SOPN: 44.0000 [IU] | PEN_INJECTOR | Freq: Every day | SUBCUTANEOUS | 11 refills | Status: DC

## 2019-03-26 ENCOUNTER — Other Ambulatory Visit: Payer: Self-pay | Admitting: *Deleted

## 2019-03-26 DIAGNOSIS — G894 Chronic pain syndrome: Secondary | ICD-10-CM

## 2019-03-26 NOTE — Telephone Encounter (Signed)
DUE Friday 03/28/2019 Patient requested refill. [place] Verified LR: 02/28/2019 Pended Rx and sent to [name] to approve on Friday 03/28/19  Needs Narcotic Contract updated.  Scheduled an appointment with [place] for 03/31/2019. Patient agreed.

## 2019-03-28 MED ORDER — OXYCODONE-ACETAMINOPHEN 10-325 MG PO TABS: 1.0000 | ORAL_TABLET | Freq: Four times a day (QID) | ORAL | 0 refills | Status: DC

## 2019-03-31 ENCOUNTER — Other Ambulatory Visit: Payer: Self-pay

## 2019-03-31 ENCOUNTER — Encounter: Payer: Self-pay | Admitting: Family

## 2019-03-31 ENCOUNTER — Ambulatory Visit (INDEPENDENT_AMBULATORY_CARE_PROVIDER_SITE_OTHER): Payer: PPO | Admitting: Family

## 2019-03-31 VITALS — BP 118/76 | HR 76 | Temp 97.5°F | Ht 67.0 in | Wt 300.0 lb

## 2019-03-31 DIAGNOSIS — G894 Chronic pain syndrome: Secondary | ICD-10-CM

## 2019-03-31 DIAGNOSIS — R42 Dizziness and giddiness: Secondary | ICD-10-CM | POA: Diagnosis not present

## 2019-03-31 DIAGNOSIS — F418 Other specified anxiety disorders: Secondary | ICD-10-CM

## 2019-03-31 MED ORDER — CLONAZEPAM 1 MG PO TABS: ORAL_TABLET | ORAL | 0 refills | Status: DC

## 2019-03-31 MED ORDER — MECLIZINE HCL 25 MG PO TABS: ORAL_TABLET | ORAL | 0 refills | Status: DC

## 2019-03-31 NOTE — Progress Notes (Signed)
Provider: Dinah Ngetich FNP-C  [name], NP  Patient Care Team: [name], NP as PCP - General (Geriatric Medicine) [name], [name], MD (Inactive) as Consulting Physician (Cardiology) [name], MD as Consulting Physician (Ophthalmology) [name], MD as Consulting Physician (General Surgery) [name], MD (Inactive) as Referring Physician (Obstetrics and Gynecology) [name] [name], MD as Consulting Physician (Ophthalmology) [facility], [name], MD as Consulting Physician (Endocrinology)  Extended Emergency Contact Information Primary Emergency Contact: Gore,Pamela Address: [address]          [place], [place] [address] Johnnette Litter of [place] Phone: [phone] Mobile Phone: [phone] Relation: Daughter Secondary Emergency Contact: Lanny Cramp,  [address] [address] [place]itter of [name] Phone: [phone] Mobile Phone: [phone] Relation: Daughter  Code Status: Full Code  Goals of care: Advanced Directive information Advanced Directives 01/24/2019  Does Patient Have a Medical Advance Directive? No;Yes  Type of Advance Directive Living will  Does patient want to make changes to medical advance directive? -  Copy of [place] in Chart? -  Would patient like information on creating a medical advance directive? -  Pre-existing out of facility DNR order (yellow form or pink MOST form) -     Chief Complaint  Patient presents with  . Follow-up    Controlled substance management   . Medication Management    Patient would like to discuss Gabapentin states she does not like the way it makes her feel. Sertraline 50 would like to discuss old medication from 2018 she started taking recently     HPI:  Pt is a 84 y.o. female seen today for an acute visit for evaluation of chronic pain.she states does not like the way Gabapentin makes her feel.She complains of feeling sleepy in the mornings.she  states took some sertraline 50 that she had from 2018.she is also on Cymbalta 60 mg capsule she complains of lower back pain,generalized joint pain and myalgia.currently on Percocet 10-325 mg tablet one by mouth every 6 hours as needed.she states sometimes her pain is a 10/10 on scale.she is also on clonazepam 1 mg tablet takes 2 mg tablet at bedtime for her anxiety. she has a significant medical history of chronic diastolic congestive Heart Failure, Osteoarthritis,CKD stage 3, COPD,OSA,Generalized anxiety disease among other conditions.Bedside Opioid-overdose calculatted and discussed with patient and her daughter present during visit.Patient's probability of overdose or serious opioid -induced respiratory depression in the next six months is 55%.Patient and daughter verbalized understanding.she would like to continue on Cymbalta and stop Gabapentin.will wean off clonazepam.   Past Medical History:  Diagnosis Date  . Allergy    takes Mucinex daily as needed  . Anemia, unspecified   . Anxiety    takes Clonazepam daily as needed  . Arthritis   . Cancer (HCC)    breast  . CHF (congestive heart failure) ([place])    takes Furosemide daily  . CKD (chronic kidney disease)   . Coronary artery disease    a. s/p IMI 2004 tx with BMS to RCA;  b. s/p Promus DES to RCA 2/12 c. 06/2014 High risk stress test follow up cath 06/2014 showd patent stent--< med therapy for other mild to moedrate disease   . Coronary atherosclerosis of native coronary artery   . Depression    takes Cymbalta daily  . Diabetes mellitus    insulin daily  . Dysuria   . GERD (gastroesophageal reflux disease)  takes Protonix daily  . Gout, unspecified    takes Colchicine daily  . Headache    occasionally  . History of blood clots about 3yrs ago   in legs-takes Coumadin   . Hyperlipidemia    takes Pravastatin daily  . Hypertension    takes Imdur and Metoprolol daily  . Insomnia   . Joint pain   . Joint swelling   .  Myocardial infarction ([place]) 2004  . Numbness   . Obstructive sleep apnea    does not wear cpap  . Osteoarthritis   . Osteoarthritis   . Osteoarthrosis, unspecified whether generalized or localized, lower leg   . Pain, chronic   . Polymyalgia rheumatica ([place])   . Pulmonary emboli ([place]) 9/13   felt to need lifelong anticoagulation  . Shortness of breath dyspnea    with exertion and has Albuterol inhaler prn  . Type II or unspecified type diabetes mellitus without mention of complication, uncontrolled   . Urinary incontinence    takes Linzess daily  . Vertigo    hx of;was taking Meclizine if needed  . Wears dentures    Past Surgical History:  Procedure Laterality Date  . ABDOMINAL HYSTERECTOMY     partial  . APPENDECTOMY    . blood clots/legs and lungs  2013  . BREAST BIOPSY Left 07/22/2014  . BREAST BIOPSY Left 02/10/2013  . BREAST LUMPECTOMY Left 11/05/2014  . BREAST LUMPECTOMY WITH RADIOACTIVE SEED LOCALIZATION Left 11/05/2014   Procedure: LEFT BREAST LUMPECTOMY WITH RADIOACTIVE SEED LOCALIZATION;  Surgeon: [name], MD;  Location: [facility];  Service: General;  Laterality: Left;  . CARDIAC CATHETERIZATION    . COLONOSCOPY    . CORONARY ANGIOPLASTY  2  . ESOPHAGOGASTRODUODENOSCOPY (EGD) WITH PROPOFOL N/A 11/07/2016   Procedure: ESOPHAGOGASTRODUODENOSCOPY (EGD) WITH PROPOFOL;  Surgeon: Gatha Mayer, MD;  Location: [facility];  Service: Endoscopy;  Laterality: N/A;  . EXCISION OF SKIN TAG Right 11/05/2014   Procedure: EXCISION OF RIGHT EYELID SKIN TAG;  Surgeon: [name], MD;  Location: [facility];  Service: General;  Laterality: Right;  . EYE SURGERY Bilateral    cataract   . GASTRIC BYPASS  1977    reversed in 1979, [place] N/A 06/29/2014   Procedure: LEFT HEART CATHETERIZATION WITH CORONARY ANGIOGRAM;  Surgeon: Troy Sine, MD;  Location: [facility] CATH LAB;  Service: Cardiovascular;  Laterality: N/A;  .  MEMBRANE PEEL Right 10/23/2018   Procedure: MEMBRANE PEEL;  Surgeon: Hurman Horn, MD;  Location: [facility];  Service: Ophthalmology;  Laterality: Right;  . MI with stent placement  2004  . PARS PLANA VITRECTOMY Right 10/23/2018   Procedure: PARS PLANA VITRECTOMY WITH 25 GAUGE;  Surgeon: Hurman Horn, MD;  Location: [facility];  Service: Ophthalmology;  Laterality: Right;    Allergies  Allergen Reactions  . Sulfonamide Derivatives Swelling    Mouth swelling  . Aricept [Donepezil Hcl] Diarrhea and Nausea And Vomiting    GI upset/loose stools  . Tramadol Nausea And Vomiting    Outpatient Encounter Medications as of 03/31/2019  Medication Sig  . albuterol (PROAIR HFA) 108 (90 Base) MCG/ACT inhaler Inhale 1-2 puffs into the lungs every 6 (six) hours as needed for wheezing or shortness of breath.  . anastrozole (ARIMIDEX) 1 MG tablet TAKE 1 TABLET BY MOUTH EVERY DAY  . budesonide-formoterol (SYMBICORT) 160-4.5 MCG/ACT inhaler Inhale 2 puffs into the lungs 2 (two) times daily. For bronchitis  . clonazePAM (  KLONOPIN) 1 MG tablet Take two tablets by mouth at bedtime as needed for anxiety  . Continuous Blood Gluc Receiver (FREESTYLE LIBRE 14 DAY READER) DEVI 1 Device by Does not apply route 4 (four) times daily -  before meals and at bedtime. For diabetes  . Continuous Blood Gluc Receiver (FREESTYLE LIBRE 14 DAY READER) DEVI 1 Device by Does not apply route as directed.  . Continuous Blood Gluc Sensor (FREESTYLE LIBRE 14 DAY SENSOR) MISC 1 Units by Does not apply route 4 (four) times daily -  before meals and at bedtime. For diabetes  . DULoxetine (CYMBALTA) 60 MG capsule TAKE 1 CAPSULE BY MOUTH EVERY DAY FOR ANXIETY  . febuxostat (ULORIC) 40 MG tablet TAKE 1 TABLET BY MOUTH EVERY DAY  . fluticasone (FLONASE) 50 MCG/ACT nasal spray Place 2 sprays into both nostrils daily.  . furosemide (LASIX) 20 MG tablet TAKE 1 TABLET BY MOUTH EVERY DAY AS NEEDED  . hydrALAZINE (APRESOLINE) 50 MG tablet TAKE 1 TABLET  BY MOUTH 2 TIMES DAILY  . insulin aspart (NOVOLOG FLEXPEN) 100 UNIT/ML FlexPen Inject 14 Units into the skin 3 (three) times daily with meals. SSI:  181-210 = 1 unit 211-240 = 2 units 241-270 = 3 units 271-300 = 4 units 301-330 = 5 units 331-360 = 6 units 361-390 = 7 units  . Insulin Glargine, 2 Unit Dial, (TOUJEO MAX SOLOSTAR) 300 UNIT/ML SOPN Inject 44 Units into the skin daily. Patient assistance program provides  . isosorbide mononitrate (IMDUR) 60 MG 24 hr tablet TAKE 1 TABLET BY MOUTH EVERY DAY  . loratadine (CLARITIN) 10 MG tablet Take 1 tablet (10 mg total) by mouth daily.  . meclizine (ANTIVERT) 25 MG tablet Take one tablet by mouth three times daily as needed for diziness  . memantine (NAMENDA) 5 MG tablet TAKE 2 TABLETS BY MOUTH EVERY MORNING and TAKE 1 TABLET BY MOUTH EVERY EVENING FOR MEMORY  . metoprolol succinate (TOPROL-XL) 25 MG 24 hr tablet TAKE 1 TABLET BY MOUTH EVERY DAY FOR BLOOD PRESSURE  . nitroGLYCERIN (NITROSTAT) 0.4 MG SL tablet Take one tablet under the tongue every 5 minutes as needed for chest pain  . oxyCODONE-acetaminophen (PERCOCET) 10-325 MG tablet Take 1 tablet by mouth every 6 (six) hours.  . pantoprazole (PROTONIX) 40 MG tablet Take 1 tablet (40 mg total) by mouth daily.  . pravastatin (PRAVACHOL) 20 MG tablet TAKE 1 TABLET BY MOUTH EVERY DAY FOR cholesterol  . sodium polystyrene (KAYEXALATE) 15 GM/60ML suspension 15 gm by mouth weekly to keep potassium down.  . topiramate (TOPAMAX) 25 MG tablet TAKE 1 TABLET BY MOUTH DAILY  . XARELTO 10 MG TABS tablet TAKE 1 TABLET BY MOUTH EVERY DAY   No facility-administered encounter medications on file as of 03/31/2019.     Review of Systems  Constitutional: Negative for appetite change, chills, fatigue and fever.  HENT: Negative for congestion, rhinorrhea, sinus pressure, sinus pain, sneezing and sore throat.   Eyes: Negative for pain, redness and itching.  Respiratory: Negative for cough, chest tightness,  shortness of breath and wheezing.   Cardiovascular: Negative for chest pain, palpitations and leg swelling.  Gastrointestinal: Negative for abdominal distention, abdominal pain, diarrhea, nausea and vomiting.  Endocrine: Negative for polydipsia, polyphagia and polyuria.  Genitourinary: Negative for difficulty urinating, dysuria, flank pain, frequency and urgency.  Musculoskeletal: Positive for arthralgias, back pain, gait problem and myalgias. Negative for joint swelling.  Skin: Negative for color change, pallor and rash.  Neurological: Negative for dizziness, speech difficulty,  weakness, light-headedness, numbness and headaches.  Hematological: Does not bruise/bleed easily.  Psychiatric/Behavioral: Negative for agitation, confusion and sleep disturbance. The patient is not nervous/anxious.     Immunization History  Administered Date(s) Administered  . Fluad Quad(high Dose 65+) 02/12/2019  . Influenza Whole 03/14/2007, 02/13/2008, 01/18/2010  . Influenza, High Dose Seasonal PF 01/16/2017, 03/11/2018  . Influenza,inj,Quad PF,6+ Mos 01/08/2013, 02/18/2014, 01/18/2015, 01/24/2016  . Influenza-Unspecified 02/15/2012, 02/18/2014  . Pneumococcal Conjugate-13 02/27/2017  . Pneumococcal Polysaccharide-23 05/08/2002  . Td 05/08/2008   Pertinent  Health Maintenance Due  Topic Date Due  . OPHTHALMOLOGY EXAM  04/25/2017  . FOOT EXAM  06/04/2019  . HEMOGLOBIN A1C  09/21/2019  . MAMMOGRAM  12/12/2019  . INFLUENZA VACCINE  Completed  . DEXA SCAN  Completed  . PNA vac Low Risk Adult  Completed   Fall Risk  03/31/2019 02/12/2019 12/18/2018 10/11/2018 08/29/2018  Falls in the past year? 0 0 0 0 0  Number falls in past yr: 0 0 0 0 0  Comment - - - - -  Injury with Fall? 0 0 0 0 0  Comment - - - - -  Risk Factor Category  - - - - -  Risk for fall due to : - - - - -  Follow up - - - - -    Vitals:   03/31/19 1355  BP: 118/76  Pulse: 76  Temp: (!) 97.5 F (36.4 C)  TempSrc: Temporal  SpO2:  96%  Weight: 300 lb (136.1 kg)  Height: 5\' 7"  (1.702 m)   Body mass index is 46.99 kg/m. Physical Exam Vitals signs reviewed.  Constitutional:      General: She is not in acute distress.    Appearance: She is obese. She is not ill-appearing.  HENT:     Head: Normocephalic.  Eyes:     General: No scleral icterus.       Right eye: No discharge.        Left eye: No discharge.     Extraocular Movements: Extraocular movements intact.     Conjunctiva/sclera: Conjunctivae normal.     Pupils: Pupils are equal, round, and reactive to light.  Neck:     Musculoskeletal: Normal range of motion. No neck rigidity or muscular tenderness.     Vascular: No carotid bruit.  Cardiovascular:     Rate and Rhythm: Normal rate and regular rhythm.     Pulses: Normal pulses.     Heart sounds: Normal heart sounds. No murmur. No friction rub. No gallop.   Pulmonary:     Effort: Pulmonary effort is normal.     Breath sounds: Normal breath sounds. No wheezing, rhonchi or rales.  Chest:     Chest wall: No tenderness.  Abdominal:     General: Bowel sounds are normal. There is no distension.     Palpations: Abdomen is soft. There is no mass.     Tenderness: There is no abdominal tenderness. There is no right CVA tenderness, left CVA tenderness, guarding or rebound.  Musculoskeletal:        General: No swelling or tenderness.     Right lower leg: No edema.     Left lower leg: No edema.     Comments: Unsteady gait on wheelchair during visit   Lymphadenopathy:     Cervical: No cervical adenopathy.  Skin:    General: Skin is warm.     Coloration: Skin is not pale.     Findings: No bruising, erythema, lesion  or rash.  Neurological:     Mental Status: She is alert and oriented to person, place, and time.     Cranial Nerves: No cranial nerve deficit.     Motor: No weakness.     Gait: Gait abnormal.  Psychiatric:        Mood and Affect: Mood normal.        Behavior: Behavior normal.        Thought  Content: Thought content normal.        Judgment: Judgment normal.    Labs reviewed: Recent Labs    01/29/19 0310 01/30/19 0240 02/12/19 1129  NA 134* 134* 140  K 4.8 4.9 4.7  CL 100 102 106  CO2 23 24 28   GLUCOSE 256* 246* 218*  BUN 49* 52* 31*  CREATININE 1.39* 1.31* 1.52*  CALCIUM 9.0 8.9 8.9   Recent Labs    01/28/19 0220 01/29/19 0310 01/30/19 0240 02/12/19 1129  AST 19 18 17 18   ALT 14 14 16 16   ALKPHOS 57 63 61  --   BILITOT 0.5 0.2* 0.3 0.5  PROT 7.4 7.6 7.2 6.6  ALBUMIN 2.8* 3.0* 2.8*  --    Recent Labs    01/29/19 0310 01/30/19 0240 02/12/19 1129  WBC 8.9 11.1* 7.2  NEUTROABS 6.7 8.1* 4,176  HGB 9.6* 8.9* 9.1*  HCT 30.1* 27.5* 28.0*  MCV 92.3 91.1 90.0  PLT 335 315 200   Lab Results  Component Value Date   TSH 0.884 10/09/2017   Lab Results  Component Value Date   HGBA1C 9.8 (A) 03/24/2019   Lab Results  Component Value Date   CHOL 90 07/17/2018   HDL 35 (L) 07/17/2018   LDLCALC 40 07/17/2018   TRIG 74 07/17/2018   CHOLHDL 2.6 07/17/2018    Significant Diagnostic Results in last 30 days:  No results found.  Assessment/Plan 1. Anxiety associated with depression Stable.will reduce clonazepam 2 mg tablet at bedtime as needed  to 1 mg tablet by mouth as needed.will need to further reduce dosage to wean her off.Recommended to continue on Cymbalta 60 mg capsule daily which will also be beneficial for her pain.Stop Sertraline for now due to drug interaction.Encouraged counseling and Psychiatry follow up.Patient and daughter will contact Psychiatry services.Has seen Psychiatry in the past.  - clonazePAM (KLONOPIN) 1 MG tablet; Take one  tablets by mouth at bedtime as needed for anxiety  Dispense: 30 tablet; Refill: 0  2. Dizziness Symptoms stable.Her Polypharmacy could be a contributory factor.Wean off clonazepam as above.Request refill for meclizine.  - meclizine (ANTIVERT) 25 MG tablet; Take one tablet by mouth three times daily as needed for  diziness  Dispense: 60 tablet; Refill: 0  3. Chronic pain syndrome Scored a 55% on probability of overdose or serious opioid -induced respiratory depression in the next six months.will wean off clonazepam as above.Rates 10/10 on scale with her generalized pain.discussed with patient to reduce percocet frequency use may consider pain management referral if still requires percocet every 6 hours next visit.discontinue use of Gabapentin due to increased drowsiness in the mornings.Pain contract updated to be scanned in Epic.  - Drug Screen 10 W/Conf, Se  Family/ staff Communication: Reviewed plan of care with patient and Daughter.   Labs/tests ordered: - Drug Screen 10 W/Conf, Se  Next appointment: 04/10/2019   Sandrea Hughs, NP

## 2019-04-06 LAB — DRUG SCREEN 10 W/CONF, SERUM
Amphetamines: NEGATIVE
Barbiturates: NEGATIVE
Benzodiazepines: NEGATIVE
Cocaine Metabolites: NEGATIVE
Marijuana: NEGATIVE
Methadone: NEGATIVE
Opiates: NEGATIVE
PCP (PHENCYCLIDINE): NEGATIVE
Propoxyphene: NEGATIVE

## 2019-04-07 ENCOUNTER — Other Ambulatory Visit: Payer: Self-pay | Admitting: Nurse Practitioner

## 2019-04-14 ENCOUNTER — Ambulatory Visit: Payer: Self-pay

## 2019-04-14 ENCOUNTER — Other Ambulatory Visit: Payer: Self-pay

## 2019-04-14 ENCOUNTER — Encounter: Payer: Self-pay | Admitting: Nurse Practitioner

## 2019-04-14 ENCOUNTER — Encounter: Payer: Self-pay | Admitting: Family

## 2019-04-14 ENCOUNTER — Ambulatory Visit (INDEPENDENT_AMBULATORY_CARE_PROVIDER_SITE_OTHER): Payer: PPO | Admitting: Nurse Practitioner

## 2019-04-14 ENCOUNTER — Other Ambulatory Visit: Payer: Self-pay | Admitting: Internal Medicine

## 2019-04-14 DIAGNOSIS — F322 Major depressive disorder, single episode, severe without psychotic features: Secondary | ICD-10-CM

## 2019-04-14 DIAGNOSIS — Z0000 Encounter for general adult medical examination without abnormal findings: Secondary | ICD-10-CM | POA: Diagnosis not present

## 2019-04-14 DIAGNOSIS — M25551 Pain in right hip: Secondary | ICD-10-CM | POA: Diagnosis not present

## 2019-04-14 DIAGNOSIS — R5381 Other malaise: Secondary | ICD-10-CM | POA: Diagnosis not present

## 2019-04-14 DIAGNOSIS — G894 Chronic pain syndrome: Secondary | ICD-10-CM | POA: Diagnosis not present

## 2019-04-14 MED ORDER — NOVOLOG FLEXPEN 100 UNIT/ML ~~LOC~~ SOPN: 16.0000 [IU] | PEN_INJECTOR | Freq: Three times a day (TID) | SUBCUTANEOUS | 3 refills | Status: DC

## 2019-04-14 NOTE — Progress Notes (Signed)
   This service is provided via telemedicine  No vital signs collected/recorded due to the encounter was a telemedicine visit.   Location of patient (ex: home, work): Home  Patient consents to a telephone visit: Yes  Location of the provider (ex: office, home):  [facility], Office   Name of any referring provider: N/A  Names of all persons participating in the telemedicine service and their role in the encounter:  S.Chrae B/CMA, [name], NP, [name] (daughter),and Patient   Time spent on call: 16 min with medical assistant

## 2019-04-14 NOTE — Patient Instructions (Signed)
Orthopedic referral placed  Can use tylenol 500 mg 1-2 tablets every 8 hours as needed for pain PT has been consulted to help with mobility and assistive devices  Please call psychiatrist and psychologist for better management of depression.

## 2019-04-14 NOTE — Progress Notes (Signed)
This service is provided via telemedicine  No vital signs collected/recorded due to the encounter was a telemedicine visit.   Location of patient (ex: home, work): Home  Patient consents to a telephone visit:  Yes  Location of the provider (ex: office, home):  [facility], Office   Name of any referring provider:  N/A  Names of all persons participating in the telemedicine service and their role in the encounter:  S.Chrae B/CMA, [name], NP, [name] (daughter) and Patient   Time spent on call:  16 min with medical assistant       Careteam: Patient Care Team: [name], NP as PCP - General (Geriatric Medicine) [name], [name], MD (Inactive) as Consulting Physician (Cardiology) [name], MD as Consulting Physician (Ophthalmology) [name], MD as Consulting Physician (General Surgery) [name], MD (Inactive) as Referring Physician (Obstetrics and Gynecology) [name] [name], MD as Consulting Physician (Ophthalmology) [facility], [name], MD as Consulting Physician (Endocrinology)  Advanced Directive information    Allergies  Allergen Reactions  . Sulfonamide Derivatives Swelling    Mouth swelling  . Aricept [Donepezil Hcl] Diarrhea and Nausea And Vomiting    GI upset/loose stools  . Tramadol Nausea And Vomiting    Chief Complaint  Patient presents with  . Medication Management    Discuss recent labwork (UDS) and why oxycodone was d/c. Telephone visit      HPI: Patient is a 84 y.o. female to review drug and depression sreening via video visit.   primarily having pain in right hip and right shoulder. Report all her other pain has left but she has 9/10 pain in her right hip most of the time.   Anxiety/depression-has never seen a therapist or a counselor in regards to her depression. No thoughts or SI or HI. Feels like she would benefit from a therapist and a psychiatrist. On Cymbalta 60 mg by mouth daily.   Daughter  needs a rx for a lift chair. Her electric chair has given out and daughter reports she has a pulled muscle.   Daughter reports her mother did not have the oxycodone in her system because she stopped taking all her medication. She is total care and relies on everyone for her care. Knows that it is a toll for them to care for her and was having severe depression so stopped all her medication.  Prior to [place] [facility] but the family could not leave her alone there because she did not want to be alone.   Just made her restart her regular medication but she has had her clonazepam or oxycodone in weeks.  Review of Systems:  Review of Systems  Constitutional: Negative for chills and fever.  HENT: Negative for tinnitus.   Respiratory: Negative for cough, sputum production and shortness of breath.   Cardiovascular: Negative for chest pain, palpitations and leg swelling.  Gastrointestinal: Negative for abdominal pain, constipation, diarrhea and heartburn.  Genitourinary: Negative for dysuria, frequency and urgency.       Incontinent of urine.  Musculoskeletal: Positive for joint pain and myalgias. Negative for back pain and falls.  Skin: Negative.   Neurological: Positive for headaches (chronic headache hx). Negative for dizziness and tingling.  Psychiatric/Behavioral: Positive for depression. Negative for memory loss. The patient does not have insomnia.     Past Medical History:  Diagnosis Date  . Allergy    takes Mucinex daily as needed  . Anemia, unspecified   . Anxiety  takes Clonazepam daily as needed  . Arthritis   . Cancer (HCC)    breast  . CHF (congestive heart failure) ([place])    takes Furosemide daily  . CKD (chronic kidney disease)   . Coronary artery disease    a. s/p IMI 2004 tx with BMS to RCA;  b. s/p Promus DES to RCA 2/12 c. 06/2014 High risk stress test follow up cath 06/2014 showd patent stent--< med therapy for other mild to moedrate disease   . Coronary  atherosclerosis of native coronary artery   . Depression    takes Cymbalta daily  . Diabetes mellitus    insulin daily  . Dysuria   . GERD (gastroesophageal reflux disease)    takes Protonix daily  . Gout, unspecified    takes Colchicine daily  . Headache    occasionally  . History of blood clots about 60yrs ago   in legs-takes Coumadin   . Hyperlipidemia    takes Pravastatin daily  . Hypertension    takes Imdur and Metoprolol daily  . Insomnia   . Joint pain   . Joint swelling   . Myocardial infarction ([place]) 2004  . Numbness   . Obstructive sleep apnea    does not wear cpap  . Osteoarthritis   . Osteoarthritis   . Osteoarthrosis, unspecified whether generalized or localized, lower leg   . Pain, chronic   . Polymyalgia rheumatica ([place])   . Pulmonary emboli ([place]) 9/13   felt to need lifelong anticoagulation  . Shortness of breath dyspnea    with exertion and has Albuterol inhaler prn  . Type II or unspecified type diabetes mellitus without mention of complication, uncontrolled   . Urinary incontinence    takes Linzess daily  . Vertigo    hx of;was taking Meclizine if needed  . Wears dentures    Past Surgical History:  Procedure Laterality Date  . ABDOMINAL HYSTERECTOMY     partial  . APPENDECTOMY    . blood clots/legs and lungs  2013  . BREAST BIOPSY Left 07/22/2014  . BREAST BIOPSY Left 02/10/2013  . BREAST LUMPECTOMY Left 11/05/2014  . BREAST LUMPECTOMY WITH RADIOACTIVE SEED LOCALIZATION Left 11/05/2014   Procedure: LEFT BREAST LUMPECTOMY WITH RADIOACTIVE SEED LOCALIZATION;  Surgeon: [name], MD;  Location: [place];  Service: General;  Laterality: Left;  . CARDIAC CATHETERIZATION    . COLONOSCOPY    . CORONARY ANGIOPLASTY  2  . ESOPHAGOGASTRODUODENOSCOPY (EGD) WITH PROPOFOL N/A 11/07/2016   Procedure: ESOPHAGOGASTRODUODENOSCOPY (EGD) WITH PROPOFOL;  Surgeon: Gatha Mayer, MD;  Location: [facility];  Service: Endoscopy;  Laterality: N/A;  . EXCISION OF  SKIN TAG Right 11/05/2014   Procedure: EXCISION OF RIGHT EYELID SKIN TAG;  Surgeon: [name], MD;  Location: [place];  Service: General;  Laterality: Right;  . EYE SURGERY Bilateral    cataract   . GASTRIC BYPASS  1977    reversed in 1979, [place] N/A 06/29/2014   Procedure: LEFT HEART CATHETERIZATION WITH CORONARY ANGIOGRAM;  Surgeon: Troy Sine, MD;  Location: [facility];  Service: Cardiovascular;  Laterality: N/A;  . MEMBRANE PEEL Right 10/23/2018   Procedure: MEMBRANE PEEL;  Surgeon: Hurman Horn, MD;  Location: [place];  Service: Ophthalmology;  Laterality: Right;  . MI with stent placement  2004  . PARS PLANA VITRECTOMY Right 10/23/2018   Procedure: PARS PLANA VITRECTOMY WITH 25 GAUGE;  Surgeon: Hurman Horn, MD;  Location: [place] OR;  Service: Ophthalmology;  Laterality: Right;   Social History:   reports that she has never smoked. She has never used smokeless tobacco. She reports that she does not drink alcohol or use drugs.  Family History  Problem Relation Age of Onset  . Breast cancer Mother 90  . Heart disease Mother   . Throat cancer Father   . Hypertension Father   . Arthritis Father   . Diabetes Father   . Arthritis Sister   . Obesity Sister   . Diabetes Sister   . Heart disease Cousin   . Colon cancer Neg Hx   . Stomach cancer Neg Hx   . Esophageal cancer Neg Hx     Medications: Patient's Medications  New Prescriptions   No medications on file  Previous Medications   ALBUTEROL (PROAIR HFA) 108 (90 BASE) MCG/ACT INHALER    Inhale 1-2 puffs into the lungs every 6 (six) hours as needed for wheezing or shortness of breath.   ANASTROZOLE (ARIMIDEX) 1 MG TABLET    TAKE 1 TABLET BY MOUTH EVERY DAY   BUDESONIDE-FORMOTEROL (SYMBICORT) 160-4.5 MCG/ACT INHALER    Inhale 2 puffs into the lungs 2 (two) times daily. For bronchitis   CONTINUOUS BLOOD GLUC RECEIVER (FREESTYLE LIBRE 14 DAY READER) DEVI    1  Device by Does not apply route 4 (four) times daily -  before meals and at bedtime. For diabetes   CONTINUOUS BLOOD GLUC RECEIVER (FREESTYLE LIBRE 14 DAY READER) DEVI    1 Device by Does not apply route as directed.   CONTINUOUS BLOOD GLUC SENSOR (FREESTYLE LIBRE 14 DAY SENSOR) MISC    1 Units by Does not apply route 4 (four) times daily -  before meals and at bedtime. For diabetes   DULOXETINE (CYMBALTA) 60 MG CAPSULE    TAKE 1 CAPSULE BY MOUTH EVERY DAY FOR ANXIETY   FEBUXOSTAT (ULORIC) 40 MG TABLET    TAKE 1 TABLET BY MOUTH EVERY DAY   FLUTICASONE (FLONASE) 50 MCG/ACT NASAL SPRAY    Place 2 sprays into both nostrils daily.   FUROSEMIDE (LASIX) 20 MG TABLET    TAKE 1 TABLET BY MOUTH EVERY DAY AS NEEDED   HYDRALAZINE (APRESOLINE) 50 MG TABLET    TAKE 1 TABLET BY MOUTH 2 TIMES DAILY   INSULIN ASPART (NOVOLOG FLEXPEN) 100 UNIT/ML FLEXPEN    Inject 16 Units into the skin 3 (three) times daily with meals. Max daily dose 70 units   INSULIN GLARGINE, 2 UNIT DIAL, (TOUJEO MAX SOLOSTAR) 300 UNIT/ML SOPN    Inject 44 Units into the skin daily. Patient assistance program provides   ISOSORBIDE MONONITRATE (IMDUR) 60 MG 24 HR TABLET    TAKE 1 TABLET BY MOUTH EVERY DAY   LORATADINE (CLARITIN) 10 MG TABLET    Take 1 tablet (10 mg total) by mouth daily.   MECLIZINE (ANTIVERT) 25 MG TABLET    Take one tablet by mouth three times daily as needed for diziness   MEMANTINE (NAMENDA) 5 MG TABLET    TAKE 2 TABLETS BY MOUTH EVERY MORNING and TAKE 1 TABLET BY MOUTH EVERY EVENING FOR MEMORY   METOPROLOL SUCCINATE (TOPROL-XL) 25 MG 24 HR TABLET    TAKE 1 TABLET BY MOUTH EVERY DAY FOR BLOOD PRESSURE   NITROGLYCERIN (NITROSTAT) 0.4 MG SL TABLET    Take one tablet under the tongue every 5 minutes as needed for chest pain   PANTOPRAZOLE (PROTONIX) 40 MG TABLET    Take 1  tablet (40 mg total) by mouth daily.   PRAVASTATIN (PRAVACHOL) 20 MG TABLET    TAKE 1 TABLET BY MOUTH EVERY DAY FOR cholesterol   SODIUM POLYSTYRENE  (KAYEXALATE) 15 GM/60ML SUSPENSION    15 gm by mouth weekly to keep potassium down.   TOPIRAMATE (TOPAMAX) 25 MG TABLET    TAKE 1 TABLET BY MOUTH DAILY   XARELTO 10 MG TABS TABLET    TAKE 1 TABLET BY MOUTH EVERY DAY  Modified Medications   No medications on file  Discontinued Medications   No medications on file    Physical Exam:  There were no vitals filed for this visit. There is no height or weight on file to calculate BMI. Wt Readings from Last 3 Encounters:  03/31/19 300 lb (136.1 kg)  03/24/19 (!) 330 lb (149.7 kg)  02/12/19 (!) 323 lb 3.2 oz (146.6 kg)      Labs reviewed: Basic Metabolic Panel: Recent Labs    01/29/19 0310 01/30/19 0240 02/12/19 1129  NA 134* 134* 140  K 4.8 4.9 4.7  CL 100 102 106  CO2 23 24 28   GLUCOSE 256* 246* 218*  BUN 49* 52* 31*  CREATININE 1.39* 1.31* 1.52*  CALCIUM 9.0 8.9 8.9   Liver Function Tests: Recent Labs    01/28/19 0220 01/29/19 0310 01/30/19 0240 02/12/19 1129  AST 19 18 17 18   ALT 14 14 16 16   ALKPHOS 57 63 61  --   BILITOT 0.5 0.2* 0.3 0.5  PROT 7.4 7.6 7.2 6.6  ALBUMIN 2.8* 3.0* 2.8*  --    Recent Labs    08/27/18 1355  LIPASE 39   No results for input(s): AMMONIA in the last 8760 hours. CBC: Recent Labs    01/29/19 0310 01/30/19 0240 02/12/19 1129  WBC 8.9 11.1* 7.2  NEUTROABS 6.7 8.1* 4,176  HGB 9.6* 8.9* 9.1*  HCT 30.1* 27.5* 28.0*  MCV 92.3 91.1 90.0  PLT 335 315 200   Lipid Panel: Recent Labs    07/17/18 1141  CHOL 90  HDL 35*  LDLCALC 40  TRIG 74  CHOLHDL 2.6   TSH: No results for input(s): TSH in the last 8760 hours. A1C: Lab Results  Component Value Date   HGBA1C 9.8 (A) 03/24/2019     Assessment/Plan 1. Depression, major, single episode, severe ([place]) Worsening at this time, denies SI or HI but stopped taking all of her medication because she felt like she was a burden. Agreeable to seek help from psychiatrist and psychologist at this time. She lives with daughter who  monitors her as well.   2. Chronic pain syndrome -referral placed to help with this. She has been off all oxycodone for several weeks- discussed based on her medication hx and co-morbidies she is at 83% risk of overdose on oxycodone.  - Ambulatory referral to [place] referral to orthopedics  3. Debility -total care for her family. They are needed lift equipment and other DME to help care for her properly.  - Ambulatory referral to [place] for evaluation and recommendedation - AMB referral to orthopedics  4. Right hip pain -now with increase pain in right hip but has quit all medication including pain medication for several weeks. Oxycodone was discontinued off medication list and advised to take tylenol 500 mg 1-2 tablets every 8 hour as needed pain. Orthopedic referral and PT order placed to also help with pain management.  - AMB referral to orthopedics  Next appt: 06/11/2019 as scheduled.  Sooner if needed  [employer]. Harle Battiest  [facility] [phone]   Virtual Visit via Video Note  I connected with [name] on 04/14/19 at  2:15 PM EST by a video enabled telemedicine application and verified that I am speaking with the correct person using two identifiers.  Location: Patient: home Provider: office   I discussed the limitations of evaluation and management by telemedicine and the availability of in person appointments. The patient expressed understanding and agreed to proceed.    I discussed the assessment and treatment plan with the patient. The patient was provided an opportunity to ask questions and all were answered. The patient agreed with the plan and demonstrated an understanding of the instructions.   The patient was advised to call back or seek an in-person evaluation if the symptoms worsen or if the condition fails to improve as anticipated.  I provided 16 minutes of non-face-to-face time during this encounter.   Carlos American. Dewaine Oats, AGNP Avs printed and mailed.

## 2019-04-14 NOTE — Progress Notes (Signed)
Subjective:   [name] is a 84 y.o. female who presents for Medicare Annual (Subsequent) preventive examination.  Review of Systems:   Cardiac Risk Factors include: advanced age (>75men, >71 women);diabetes mellitus;hypertension;sedentary lifestyle;obesity (BMI >30kg/m2);dyslipidemia;family history of premature cardiovascular disease     Objective:     Vitals: There were no vitals taken for this visit.  There is no height or weight on file to calculate BMI.  Advanced Directives 01/24/2019 10/23/2018 10/11/2018 08/29/2018 06/03/2018 04/12/2018 03/11/2018  Does Patient Have a Medical Advance Directive? No;Yes No No No No Yes No  Type of Advance Directive Living will - - - Public librarian;Living will -  Does patient want to make changes to medical advance directive? - - - - Yes (MAU/Ambulatory/Procedural Areas - Information given) No - Patient declined -  Copy of [place] in Chart? - - No - copy requested - No - copy requested No - copy requested -  Would patient like information on creating a medical advance directive? - No - Patient declined - No - Patient declined - - -  Pre-existing out of facility DNR order (yellow form or pink MOST form) - - - - - - -    Tobacco Social History   Tobacco Use  Smoking Status Never Smoker  Smokeless Tobacco Never Used     Counseling given: Not Answered   Clinical Intake:  Pre-visit preparation completed: Yes  Pain : 0-10 Pain Score: 9  Pain Type: Chronic pain Pain Location: Hip Pain Orientation: Right Pain Descriptors / Indicators: Aching, Throbbing Pain Onset: More than a month ago Pain Frequency: Intermittent Pain Relieving Factors: oxycodone Effect of Pain on Daily Activities: unable to turn over, hurts to move  Pain Relieving Factors: oxycodone  BMI - recorded: 46.99 Nutritional Status: BMI > 30  Obese Nutritional Risks: None Diabetes: Yes  How often do you need to have someone help you  when you read instructions, pamphlets, or other written materials from your doctor or pharmacy?: 5 - Always What is the last grade level you completed in school?: 12th grade  Interpreter Needed?: No     Past Medical History:  Diagnosis Date  . Allergy    takes Mucinex daily as needed  . Anemia, unspecified   . Anxiety    takes Clonazepam daily as needed  . Arthritis   . Cancer (HCC)    breast  . CHF (congestive heart failure) ([place])    takes Furosemide daily  . CKD (chronic kidney disease)   . Coronary artery disease    a. s/p IMI 2004 tx with BMS to RCA;  b. s/p Promus DES to RCA 2/12 c. 06/2014 High risk stress test follow up cath 06/2014 showd patent stent--< med therapy for other mild to moedrate disease   . Coronary atherosclerosis of native coronary artery   . Depression    takes Cymbalta daily  . Diabetes mellitus    insulin daily  . Dysuria   . GERD (gastroesophageal reflux disease)    takes Protonix daily  . Gout, unspecified    takes Colchicine daily  . Headache    occasionally  . History of blood clots about 67yrs ago   in legs-takes Coumadin   . Hyperlipidemia    takes Pravastatin daily  . Hypertension    takes Imdur and Metoprolol daily  . Insomnia   . Joint pain   . Joint swelling   . Myocardial infarction ([place]) 2004  . Numbness   .  Obstructive sleep apnea    does not wear cpap  . Osteoarthritis   . Osteoarthritis   . Osteoarthrosis, unspecified whether generalized or localized, lower leg   . Pain, chronic   . Polymyalgia rheumatica ([place])   . Pulmonary emboli ([place]) 9/13   felt to need lifelong anticoagulation  . Shortness of breath dyspnea    with exertion and has Albuterol inhaler prn  . Type II or unspecified type diabetes mellitus without mention of complication, uncontrolled   . Urinary incontinence    takes Linzess daily  . Vertigo    hx of;was taking Meclizine if needed  . Wears dentures    Past Surgical History:  Procedure Laterality  Date  . ABDOMINAL HYSTERECTOMY     partial  . APPENDECTOMY    . blood clots/legs and lungs  2013  . BREAST BIOPSY Left 07/22/2014  . BREAST BIOPSY Left 02/10/2013  . BREAST LUMPECTOMY Left 11/05/2014  . BREAST LUMPECTOMY WITH RADIOACTIVE SEED LOCALIZATION Left 11/05/2014   Procedure: LEFT BREAST LUMPECTOMY WITH RADIOACTIVE SEED LOCALIZATION;  Surgeon: [name], MD;  Location: [facility];  Service: General;  Laterality: Left;  . CARDIAC CATHETERIZATION    . COLONOSCOPY    . CORONARY ANGIOPLASTY  2  . ESOPHAGOGASTRODUODENOSCOPY (EGD) WITH PROPOFOL N/A 11/07/2016   Procedure: ESOPHAGOGASTRODUODENOSCOPY (EGD) WITH PROPOFOL;  Surgeon: Gatha Mayer, MD;  Location: [facility];  Service: Endoscopy;  Laterality: N/A;  . EXCISION OF SKIN TAG Right 11/05/2014   Procedure: EXCISION OF RIGHT EYELID SKIN TAG;  Surgeon: [name], MD;  Location: [facility];  Service: General;  Laterality: Right;  . EYE SURGERY Bilateral    cataract   . GASTRIC BYPASS  1977    reversed in 1979, [place] N/A 06/29/2014   Procedure: LEFT HEART CATHETERIZATION WITH CORONARY ANGIOGRAM;  Surgeon: Troy Sine, MD;  Location: [facility] CATH LAB;  Service: Cardiovascular;  Laterality: N/A;  . MEMBRANE PEEL Right 10/23/2018   Procedure: MEMBRANE PEEL;  Surgeon: Hurman Horn, MD;  Location: [facility];  Service: Ophthalmology;  Laterality: Right;  . MI with stent placement  2004  . PARS PLANA VITRECTOMY Right 10/23/2018   Procedure: PARS PLANA VITRECTOMY WITH 25 GAUGE;  Surgeon: Hurman Horn, MD;  Location: [facility];  Service: Ophthalmology;  Laterality: Right;   Family History  Problem Relation Age of Onset  . Breast cancer Mother 76  . Heart disease Mother   . Throat cancer Father   . Hypertension Father   . Arthritis Father   . Diabetes Father   . Arthritis Sister   . Obesity Sister   . Diabetes Sister   . Heart disease Cousin   . Colon cancer Neg Hx   .  Stomach cancer Neg Hx   . Esophageal cancer Neg Hx    Social History   Socioeconomic History  . Marital status: Widowed    Spouse name: Not on file  . Number of children: Not on file  . Years of education: Not on file  . Highest education level: Not on file  Occupational History  . Occupation: retired  Scientific laboratory technician  . Financial resource strain: Not hard at all  . Food insecurity    Worry: Never true    Inability: Never true  . Transportation needs    Medical: Yes    Non-medical: Yes  Tobacco Use  . Smoking status: Never Smoker  . Smokeless tobacco: Never Used  Substance  and Sexual Activity  . Alcohol use: No    Alcohol/week: 0.0 standard drinks  . Drug use: No  . Sexual activity: Not Currently  Lifestyle  . Physical activity    Days per week: 0 days    Minutes per session: 0 min  . Stress: Rather much  Relationships  . Social connections    Talks on phone: Once a week    Gets together: Three times a week    Attends religious service: 1 to 4 times per year    Active member of club or organization: Yes    Attends meetings of clubs or organizations: 1 to 4 times per year    Relationship status: Widowed  Other Topics Concern  . Not on file  Social History Narrative  . Not on file    Outpatient Encounter Medications as of 04/14/2019  Medication Sig  . albuterol (PROAIR HFA) 108 (90 Base) MCG/ACT inhaler Inhale 1-2 puffs into the lungs every 6 (six) hours as needed for wheezing or shortness of breath.  . anastrozole (ARIMIDEX) 1 MG tablet TAKE 1 TABLET BY MOUTH EVERY DAY  . budesonide-formoterol (SYMBICORT) 160-4.5 MCG/ACT inhaler Inhale 2 puffs into the lungs 2 (two) times daily. For bronchitis  . Continuous Blood Gluc Receiver (FREESTYLE LIBRE 14 DAY READER) DEVI 1 Device by Does not apply route 4 (four) times daily -  before meals and at bedtime. For diabetes  . Continuous Blood Gluc Receiver (FREESTYLE LIBRE 14 DAY READER) DEVI 1 Device by Does not apply route as  directed.  . Continuous Blood Gluc Sensor (FREESTYLE LIBRE 14 DAY SENSOR) MISC 1 Units by Does not apply route 4 (four) times daily -  before meals and at bedtime. For diabetes  . DULoxetine (CYMBALTA) 60 MG capsule TAKE 1 CAPSULE BY MOUTH EVERY DAY FOR ANXIETY  . febuxostat (ULORIC) 40 MG tablet TAKE 1 TABLET BY MOUTH EVERY DAY  . fluticasone (FLONASE) 50 MCG/ACT nasal spray Place 2 sprays into both nostrils daily.  . furosemide (LASIX) 20 MG tablet TAKE 1 TABLET BY MOUTH EVERY DAY AS NEEDED  . hydrALAZINE (APRESOLINE) 50 MG tablet TAKE 1 TABLET BY MOUTH 2 TIMES DAILY  . insulin aspart (NOVOLOG FLEXPEN) 100 UNIT/ML FlexPen Inject 16 Units into the skin 3 (three) times daily with meals. Max daily dose 70 units  . Insulin Glargine, 2 Unit Dial, (TOUJEO MAX SOLOSTAR) 300 UNIT/ML SOPN Inject 44 Units into the skin daily. Patient assistance program provides  . isosorbide mononitrate (IMDUR) 60 MG 24 hr tablet TAKE 1 TABLET BY MOUTH EVERY DAY  . loratadine (CLARITIN) 10 MG tablet Take 1 tablet (10 mg total) by mouth daily.  . meclizine (ANTIVERT) 25 MG tablet Take one tablet by mouth three times daily as needed for diziness  . memantine (NAMENDA) 5 MG tablet TAKE 2 TABLETS BY MOUTH EVERY MORNING and TAKE 1 TABLET BY MOUTH EVERY EVENING FOR MEMORY  . metoprolol succinate (TOPROL-XL) 25 MG 24 hr tablet TAKE 1 TABLET BY MOUTH EVERY DAY FOR BLOOD PRESSURE  . nitroGLYCERIN (NITROSTAT) 0.4 MG SL tablet Take one tablet under the tongue every 5 minutes as needed for chest pain  . pantoprazole (PROTONIX) 40 MG tablet Take 1 tablet (40 mg total) by mouth daily.  . pravastatin (PRAVACHOL) 20 MG tablet TAKE 1 TABLET BY MOUTH EVERY DAY FOR cholesterol  . sodium polystyrene (KAYEXALATE) 15 GM/60ML suspension 15 gm by mouth weekly to keep potassium down.  . topiramate (TOPAMAX) 25 MG tablet TAKE 1 TABLET  BY MOUTH DAILY  . XARELTO 10 MG TABS tablet TAKE 1 TABLET BY MOUTH EVERY DAY   No facility-administered  encounter medications on file as of 04/14/2019.     Activities of Daily Living In your present state of health, do you have any difficulty performing the following activities: 04/14/2019 01/25/2019  Hearing? Y N  Vision? Y N  Difficulty concentrating or making decisions? Y N  Walking or climbing stairs? Y Y  Dressing or bathing? Y Y  Doing errands, shopping? [name]  Preparing Food and eating ? Y -  Using the Toilet? Y -  In the past six months, have you accidently leaked urine? Y -  Comment incontient of urine -  Do you have problems with loss of bowel control? N -  Managing your Medications? Y -  Comment daughter manages -  Managing your Finances? Y -  Comment daughter manages -  Housekeeping or managing your Housekeeping? Y -  Comment daughter manages -  Some recent data might be hidden    Patient Care Team: [name], NP as PCP - General (Geriatric Medicine) [name], [name], MD (Inactive) as Consulting Physician (Cardiology) [name], MD as Consulting Physician (Ophthalmology) [name], MD as Consulting Physician (General Surgery) [name], MD (Inactive) as Referring Physician (Obstetrics and Gynecology) [name] [name], MD as Consulting Physician (Ophthalmology) [facility], [name], MD as Consulting Physician (Endocrinology)    Assessment:   This is a routine wellness examination for Brenley.  Exercise Activities and Dietary recommendations Current Exercise Habits: The patient does not participate in regular exercise at present, Exercise limited by: orthopedic condition(s)  Goals    . DIET - EAT MORE FRUITS AND VEGETABLES     Patient will increase vegetables.    . Increase physical activity     Starting 03/24/16,  I will attempt to get out of bed daily for at least 30 min at a time.     . Weight (lb) < 250 lb (113.4 kg)     Would like to lose weight to 250 lb by eating better        Fall Risk Fall Risk  04/14/2019 03/31/2019 02/12/2019  12/18/2018 10/11/2018  Falls in the past year? 0 0 0 0 0  Number falls in past yr: 0 0 0 0 0  Comment - - - - -  Injury with Fall? 0 0 0 0 0  Comment - - - - -  Risk Factor Category  - - - - -  Risk for fall due to : - - - - -  Follow up - - - - -   Is the patient's home free of loose throw rugs in walkways, pet beds, electrical cords, etc?   yes      Grab bars in the bathroom? yes      Handrails on the stairs?   no      Adequate lighting?   yes  Timed Get Up and Go performed: na  Depression Screen PHQ 2/9 Scores 04/14/2019 10/11/2018 04/12/2018 01/14/2018  PHQ - 2 Score 3 6 6 3   PHQ- 9 Score 15 19 17 9   Exception Documentation - - - -  Not completed - - - -     Cognitive Function MMSE - Mini Mental State Exam 04/12/2018 08/02/2016 08/02/2016 07/27/2016 03/24/2016  Orientation to time 4 4 4 4 5   Orientation to Place 5 5 5 5 5   Registration 3 3 3 3 3   Attention/ Calculation  5 5 5 5  0  Recall 1 1 1 1 2   Language- name 2 objects 2 2 2 2 2   Language- repeat 1 1 1 1 1   Language- follow 3 step command 3 2 2 1 3   Language- read & follow direction 1 1 1 1 1   Write a sentence 1 1 1 1 1   Copy design 0 1 1 1 1   Total score 26 26 26 25 24      6CIT Screen 04/14/2019  What Year? 0 points  What month? 0 points  What time? 0 points  Count back from 20 4 points  Months in reverse 4 points  Repeat phrase 8 points  Total Score 16    Immunization History  Administered Date(s) Administered  . Fluad Quad(high Dose 65+) 02/12/2019  . Influenza Whole 03/14/2007, 02/13/2008, 01/18/2010  . Influenza, High Dose Seasonal PF 01/16/2017, 03/11/2018  . Influenza,inj,Quad PF,6+ Mos 01/08/2013, 02/18/2014, 01/18/2015, 01/24/2016  . Influenza-Unspecified 02/15/2012, 02/18/2014  . Pneumococcal Conjugate-13 02/27/2017  . Pneumococcal Polysaccharide-23 05/08/2002  . Td 05/08/2008    Qualifies for Shingles Vaccine?yes  Screening Tests Health Maintenance  Topic Date Due  . OPHTHALMOLOGY EXAM   04/25/2017  . TETANUS/TDAP  07/17/2019 (Originally 05/08/2018)  . FOOT EXAM  06/04/2019  . HEMOGLOBIN A1C  09/21/2019  . MAMMOGRAM  12/12/2019  . INFLUENZA VACCINE  Completed  . DEXA SCAN  Completed  . PNA vac Low Risk Adult  Completed    Cancer Screenings: Lung: Low Dose CT Chest recommended if Age 64-80 years, 30 pack-year currently smoking OR have quit w/in 15years. Patient does not qualify. Breast:  Up to date on Mammogram? Yes   Up to date of Bone Density/Dexa? No unable to stand up to complete Colorectal: aged out  Additional Screenings: na Hepatitis C Screening:      Plan:      I have personally reviewed and noted the following in the patient's chart:   . Medical and social history . Use of alcohol, tobacco or illicit drugs  . Current medications and supplements . Functional ability and status . Nutritional status . Physical activity . Advanced directives . List of other physicians . Hospitalizations, surgeries, and ER visits in previous 12 months . Vitals . Screenings to include cognitive, depression, and falls . Referrals and appointments  In addition, I have reviewed and discussed with patient certain preventive protocols, quality metrics, and best practice recommendations. A written personalized care plan for preventive services as well as general preventive health recommendations were provided to patient.     [name], NP  04/14/2019

## 2019-04-14 NOTE — Patient Instructions (Signed)
[name] , Thank you for taking time to come for your Medicare Wellness Visit. I appreciate your ongoing commitment to your health goals. Please review the following plan we discussed and let me know if I can assist you in the future.   Screening recommendations/referrals: Colonoscopy age out Mammogram up to date Bone Density unable to complet Recommended yearly ophthalmology/optometry visit for glaucoma screening and checkup Recommended yearly dental visit for hygiene and checkup  Vaccinations: Influenza vaccine up to date Pneumococcal vaccine up to date Tdap vaccine DUE- to get at your local pharmacy Shingles vaccine DUE- to get at your local pharmacy    Advanced directives: to bring to the office so we can place these documents on file.   Conditions/risks identified: at risk for cardiovascular event due to weight and co-morbidies, recommended weight loss.   Next appointment: 1 year   Preventive Care 82 Years and Older, Female Preventive care refers to lifestyle choices and visits with your health care provider that can promote health and wellness. What does preventive care include?  A yearly physical exam. This is also called an annual well check.  Dental exams once or twice a year.  Routine eye exams. Ask your health care provider how often you should have your eyes checked.  Personal lifestyle choices, including:  Daily care of your teeth and gums.  Regular physical activity.  Eating a healthy diet.  Avoiding tobacco and drug use.  Limiting alcohol use.  Practicing safe sex.  Taking low-dose aspirin every day.  Taking vitamin and mineral supplements as recommended by your health care provider. What happens during an annual well check? The services and screenings done by your health care provider during your annual well check will depend on your age, overall health, lifestyle risk factors, and family history of disease. Counseling  Your health care provider  may ask you questions about your:  Alcohol use.  Tobacco use.  Drug use.  Emotional well-being.  Home and relationship well-being.  Sexual activity.  Eating habits.  History of falls.  Memory and ability to understand (cognition).  Work and work Statistician.  Reproductive health. Screening  You may have the following tests or measurements:  Height, weight, and BMI.  Blood pressure.  Lipid and cholesterol levels. These may be checked every 5 years, or more frequently if you are over 67 years old.  Skin check.  Lung cancer screening. You may have this screening every year starting at age 66 if you have a 30-pack-year history of smoking and currently smoke or have quit within the past 15 years.  Fecal occult blood test (FOBT) of the stool. You may have this test every year starting at age 54.  Flexible sigmoidoscopy or colonoscopy. You may have a sigmoidoscopy every 5 years or a colonoscopy every 10 years starting at age 23.  Hepatitis C blood test.  Hepatitis B blood test.  Sexually transmitted disease (STD) testing.  Diabetes screening. This is done by checking your blood sugar (glucose) after you have not eaten for a while (fasting). You may have this done every 1-3 years.  Bone density scan. This is done to screen for osteoporosis. You may have this done starting at age 70.  Mammogram. This may be done every 1-2 years. Talk to your health care provider about how often you should have regular mammograms. Talk with your health care provider about your test results, treatment options, and if necessary, the need for more tests. Vaccines  Your health care provider may  recommend certain vaccines, such as:  Influenza vaccine. This is recommended every year.  Tetanus, diphtheria, and acellular pertussis (Tdap, Td) vaccine. You may need a Td booster every 10 years.  Zoster vaccine. You may need this after age 10.  Pneumococcal 13-valent conjugate (PCV13) vaccine.  One dose is recommended after age 61.  Pneumococcal polysaccharide (PPSV23) vaccine. One dose is recommended after age 72. Talk to your health care provider about which screenings and vaccines you need and how often you need them. This information is not intended to replace advice given to you by your health care provider. Make sure you discuss any questions you have with your health care provider. Document Released: 05/21/2015 Document Revised: 01/12/2016 Document Reviewed: 02/23/2015 Elsevier Interactive Patient Education  2017 [place] Prevention in the Home Falls can cause injuries. They can happen to people of all ages. There are many things you can do to make your home safe and to help prevent falls. What can I do on the outside of my home?  Regularly fix the edges of walkways and driveways and fix any cracks.  Remove anything that might make you trip as you walk through a door, such as a raised step or threshold.  Trim any bushes or trees on the path to your home.  Use bright outdoor lighting.  Clear any walking paths of anything that might make someone trip, such as rocks or tools.  Regularly check to see if handrails are loose or broken. Make sure that both sides of any steps have handrails.  Any raised decks and porches should have guardrails on the edges.  Have any leaves, snow, or ice cleared regularly.  Use sand or salt on walking paths during winter.  Clean up any spills in your garage right away. This includes oil or grease spills. What can I do in the bathroom?  Use night lights.  Install grab bars by the toilet and in the tub and shower. Do not use towel bars as grab bars.  Use non-skid mats or decals in the tub or shower.  If you need to sit down in the shower, use a plastic, non-slip stool.  Keep the floor dry. Clean up any water that spills on the floor as soon as it happens.  Remove soap buildup in the tub or shower regularly.  Attach bath  mats securely with double-sided non-slip rug tape.  Do not have throw rugs and other things on the floor that can make you trip. What can I do in the bedroom?  Use night lights.  Make sure that you have a light by your bed that is easy to reach.  Do not use any sheets or blankets that are too big for your bed. They should not hang down onto the floor.  Have a firm chair that has side arms. You can use this for support while you get dressed.  Do not have throw rugs and other things on the floor that can make you trip. What can I do in the kitchen?  Clean up any spills right away.  Avoid walking on wet floors.  Keep items that you use a lot in easy-to-reach places.  If you need to reach something above you, use a strong step stool that has a grab bar.  Keep electrical cords out of the way.  Do not use floor polish or wax that makes floors slippery. If you must use wax, use non-skid floor wax.  Do not have throw rugs and  other things on the floor that can make you trip. What can I do with my stairs?  Do not leave any items on the stairs.  Make sure that there are handrails on both sides of the stairs and use them. Fix handrails that are broken or loose. Make sure that handrails are as long as the stairways.  Check any carpeting to make sure that it is firmly attached to the stairs. Fix any carpet that is loose or worn.  Avoid having throw rugs at the top or bottom of the stairs. If you do have throw rugs, attach them to the floor with carpet tape.  Make sure that you have a light switch at the top of the stairs and the bottom of the stairs. If you do not have them, ask someone to add them for you. What else can I do to help prevent falls?  Wear shoes that:  Do not have high heels.  Have rubber bottoms.  Are comfortable and fit you well.  Are closed at the toe. Do not wear sandals.  If you use a stepladder:  Make sure that it is fully opened. Do not climb a closed  stepladder.  Make sure that both sides of the stepladder are locked into place.  Ask someone to hold it for you, if possible.  Clearly mark and make sure that you can see:  Any grab bars or handrails.  First and last steps.  Where the edge of each step is.  Use tools that help you move around (mobility aids) if they are needed. These include:  Canes.  Walkers.  Scooters.  Crutches.  Turn on the lights when you go into a dark area. Replace any light bulbs as soon as they burn out.  Set up your furniture so you have a clear path. Avoid moving your furniture around.  If any of your floors are uneven, fix them.  If there are any pets around you, be aware of where they are.  Review your medicines with your doctor. Some medicines can make you feel dizzy. This can increase your chance of falling. Ask your doctor what other things that you can do to help prevent falls. This information is not intended to replace advice given to you by your health care provider. Make sure you discuss any questions you have with your health care provider. Document Released: 02/18/2009 Document Revised: 09/30/2015 Document Reviewed: 05/29/2014 Elsevier Interactive Patient Education  2017 [employer].

## 2019-04-15 ENCOUNTER — Other Ambulatory Visit: Payer: Self-pay | Admitting: Nurse Practitioner

## 2019-04-16 ENCOUNTER — Telehealth: Payer: Self-pay

## 2019-04-16 DIAGNOSIS — I5032 Chronic diastolic (congestive) heart failure: Secondary | ICD-10-CM | POA: Diagnosis not present

## 2019-04-16 DIAGNOSIS — E1140 Type 2 diabetes mellitus with diabetic neuropathy, unspecified: Secondary | ICD-10-CM | POA: Diagnosis not present

## 2019-04-16 DIAGNOSIS — M1710 Unilateral primary osteoarthritis, unspecified knee: Secondary | ICD-10-CM | POA: Diagnosis not present

## 2019-04-16 DIAGNOSIS — G894 Chronic pain syndrome: Secondary | ICD-10-CM | POA: Diagnosis not present

## 2019-04-16 DIAGNOSIS — M1030 Gout due to renal impairment, unspecified site: Secondary | ICD-10-CM | POA: Diagnosis not present

## 2019-04-16 DIAGNOSIS — N183 Chronic kidney disease, stage 3 unspecified: Secondary | ICD-10-CM | POA: Diagnosis not present

## 2019-04-16 DIAGNOSIS — I2510 Atherosclerotic heart disease of native coronary artery without angina pectoris: Secondary | ICD-10-CM | POA: Diagnosis not present

## 2019-04-16 DIAGNOSIS — I130 Hypertensive heart and chronic kidney disease with heart failure and stage 1 through stage 4 chronic kidney disease, or unspecified chronic kidney disease: Secondary | ICD-10-CM | POA: Diagnosis not present

## 2019-04-16 DIAGNOSIS — M353 Polymyalgia rheumatica: Secondary | ICD-10-CM | POA: Diagnosis not present

## 2019-04-16 DIAGNOSIS — R5381 Other malaise: Secondary | ICD-10-CM

## 2019-04-16 DIAGNOSIS — M25551 Pain in right hip: Secondary | ICD-10-CM | POA: Diagnosis not present

## 2019-04-16 DIAGNOSIS — F322 Major depressive disorder, single episode, severe without psychotic features: Secondary | ICD-10-CM | POA: Diagnosis not present

## 2019-04-16 DIAGNOSIS — I252 Old myocardial infarction: Secondary | ICD-10-CM | POA: Diagnosis not present

## 2019-04-16 DIAGNOSIS — E1122 Type 2 diabetes mellitus with diabetic chronic kidney disease: Secondary | ICD-10-CM | POA: Diagnosis not present

## 2019-04-16 NOTE — Telephone Encounter (Signed)
Myna Bright, PT with [facility] called concerning patient. She states she would recommend PT for patient 1 time a week for 6 weeks. She also has 4 other recommendation.  1. New lift chair  - states prescription will need to be written for this  2. Purewick system - states to help at night - external catheter- paper work should be coming to our office to be signed   3. Power mobility chair - states house is set up perfect for patient to maneuver around and get outside to have fresh air, also take some strain off daughter.  4. Recommends nurse come out to go over diabetes management with daughter.   She states a detailed message can be left at [phone]

## 2019-04-17 NOTE — Telephone Encounter (Signed)
Okay to give order for new lift chair, home health nursing, and they can start the process/ assessment for getting power mobility chair

## 2019-04-18 NOTE — Telephone Encounter (Signed)
Gerilynn Pt with [facility] just called back and stated patient's is still of sure where they would like to send orders for lift chair. She suggested to mail copy of orders to patient, so they would be able to take anywhere they decide.  Made a copy of order to keep and placed hard copy in mail for patient.   Notified patient.

## 2019-04-18 NOTE — Telephone Encounter (Signed)
Per last night, dx: debility, chronic pain, right hip pain

## 2019-04-18 NOTE — Telephone Encounter (Signed)
Tried calling [name] back and left detailed message letting her know of providers agreement. Also called to find out where they would like order for lift chair sent. LMOM for [name] to call office.

## 2019-04-18 NOTE — Telephone Encounter (Signed)
Called patient's daughter to see where they would like order for lift chair faxed. She states she will need to call us back. She is still looking for a company.

## 2019-04-18 NOTE — Telephone Encounter (Signed)
[employer]

## 2019-04-21 ENCOUNTER — Telehealth: Payer: Self-pay

## 2019-04-21 ENCOUNTER — Other Ambulatory Visit: Payer: Self-pay

## 2019-04-21 MED ORDER — FREESTYLE LIBRE 14 DAY READER DEVI: 1.0000 | 0 refills | Status: DC

## 2019-04-21 NOTE — Telephone Encounter (Signed)
Patient daughter called in stating that she can not take her blood sugar because if the sensor she is using.    Please call and get better understanding as the story was all over for me

## 2019-04-21 NOTE — Telephone Encounter (Signed)
Spoke to pt daughter [name], sent rx for new receiver to pharmacy and asked her to contact [facility] for replacement sensors.

## 2019-04-22 ENCOUNTER — Encounter: Payer: Self-pay | Admitting: Orthopaedic Surgery

## 2019-04-22 ENCOUNTER — Ambulatory Visit: Payer: Self-pay

## 2019-04-22 ENCOUNTER — Ambulatory Visit (INDEPENDENT_AMBULATORY_CARE_PROVIDER_SITE_OTHER): Payer: PPO | Admitting: Orthopaedic Surgery

## 2019-04-22 ENCOUNTER — Other Ambulatory Visit: Payer: Self-pay

## 2019-04-22 DIAGNOSIS — M25551 Pain in right hip: Secondary | ICD-10-CM

## 2019-04-22 NOTE — Progress Notes (Signed)
Office Visit Note   Patient: [name]           Date of Birth: [birth year]           MRN: [id number] Visit Date: 04/22/2019              Requested by: [name], NP [place],  [place] [address] PCP: [name], NP   Assessment & Plan: Visit Diagnoses:  1. Pain in right hip     Plan: Impression is right hip osteoarthritis.  We will refer the patient to Dr. Junius Roads for an ultrasound-guided cortisone injection.  Follow-up with [place] as needed.  Follow-Up Instructions: Return if symptoms worsen or fail to improve.   Orders:  Orders Placed This Encounter  Procedures  . XR Pelvis 1-2 Views  . US Guided Needle Placement - No Linked Charges   No orders of the defined types were placed in this encounter.     Procedures: No procedures performed   Clinical Data: No additional findings.   Subjective: Chief Complaint  Patient presents with  . Lower Back - Pain  . Right Hip - Pain    HPI patient is a pleasant 84 year old female who presents our clinic today with right hip pain.  This has been ongoing for the past 2 weeks.  No known injury or change in activity.  She ambulates in a wheelchair at baseline but is able to stand for transfers.  The pain she has is to the groin.  Worse with transfers.  She has been taking oxycodone without relief of symptoms.  No previous injection to the right hip joint.  Review of Systems as detailed in HPI.  All others reviewed and are negative.   Objective: Vital Signs: There were no vitals taken for this visit.  Physical Exam well-developed well-nourished female in no acute distress.  Alert and oriented x3.  Ortho Exam examination of her right hip reveals a markedly positive logroll.  Negative straight leg raise.  She is neurovascular intact distally.  Specialty Comments:  No specialty comments available.  Imaging: US Guided Needle Placement - No Linked Charges  Result Date: 04/22/2019 Please see  Notes tab for imaging impression.  XR Pelvis 1-2 Views  Result Date: 04/22/2019 Moderate degenerative changes to the right hip joint    PMFS History: Patient Active Problem List   Diagnosis Date Noted  . Type 2 diabetes mellitus with hyperglycemia, with long-term current use of insulin ([place]) 03/25/2019  . Pneumonia due to COVID-19 virus 01/25/2019  . Type 2 diabetes mellitus with diabetic neuropathy, with long-term current use of insulin ([place]) 12/13/2017  . CHF (congestive heart failure) ([place]) 10/08/2017  . Hyperkalemia 07/26/2017  . Anxiety associated with depression 06/08/2017  . GERD with esophagitis 11/15/2016  . Memory loss 11/15/2016  . Multiple benign nevi 11/15/2016  . Esophageal dysphagia 11/07/2016  . Severe episode of recurrent major depressive disorder, without psychotic features ([place]) 06/23/2016  . Endometrial polyp 06/23/2016  . Vaginitis and vulvovaginitis 06/23/2016  . Cough variant asthma 11/17/2015  . Vitamin D deficiency 08/02/2015  . Breast cancer ([place]) 04/26/2015  . Dyslipidemia associated with type 2 diabetes mellitus ([place]) 02/06/2015  . Type 2 diabetes mellitus with stage 3b chronic kidney disease, with long-term current use of insulin ([place]) 02/06/2015  . Chronic pain syndrome 02/06/2015  . Morbid obesity with BMI of 50.0-59.9, adult ([place]) 12/18/2014  . CKD (chronic kidney disease) stage 3, GFR 30-59 ml/min (HCC) 12/18/2014  .  Gout 10/27/2014  . Breast mass 10/27/2014  . Encounter for therapeutic drug monitoring 10/08/2014  . Abnormal stress test 09/27/2014  . Vertigo 09/24/2014  . Chronic bronchitis ([place]) 09/23/2014  . Preoperative clearance 08/16/2014  . DOE (dyspnea on exertion) 06/24/2014  . Postmenopausal bleeding 06/04/2014  . CN (constipation) 05/10/2014  . History of pulmonary embolus (PE) 04/22/2014  . COPD (chronic obstructive pulmonary disease) ([place]) 04/21/2014  . Estrogen deficiency 02/19/2014  . Breast cancer screening 02/19/2014  .  Weight gain 02/19/2014  . Pain in the chest 12/31/2013  . Type 2 diabetes mellitus with diabetic foot deformity ([place]) 07/29/2013  . OSA (obstructive sleep apnea) 02/24/2013  . Need for prophylactic vaccination and inoculation against influenza 01/08/2013  . Insomnia 09/17/2012  . Debility 08/01/2012  . Osteoarthritis 08/01/2012  . Dysphagia, unspecified(787.20) 07/31/2012  . GERD (gastroesophageal reflux disease) 07/31/2012  . Long term current use of anticoagulant therapy 02/02/2012  . Chronic diastolic congestive heart failure ([place]) 01/29/2012  . History of deep venous thrombosis 01/27/2012  . Abdominal pain 01/26/2012  . Cholelithiasis 06/06/2010  . Situational depression 06/04/2009  . ORTHOPNEA 03/09/2009  . Morbid (severe) obesity due to excess calories ([place]) 02/15/2009  . Microcytic anemia 02/15/2009  . POLYMYALGIA RHEUMATICA 01/18/2009  . TENSION HEADACHE 01/07/2008  . Headache 01/07/2008  . Fungal dermatitis 11/13/2007  . CAD (coronary artery disease) 08/29/2007  . URINARY INCONTINENCE 08/29/2007  . Hyperlipidemia 12/03/2006  . Essential hypertension 12/03/2006  . Personal history of other diseases of the digestive system 12/03/2006   Past Medical History:  Diagnosis Date  . Allergy    takes Mucinex daily as needed  . Anemia, unspecified   . Anxiety    takes Clonazepam daily as needed  . Arthritis   . Cancer (HCC)    breast  . CHF (congestive heart failure) ([place])    takes Furosemide daily  . CKD (chronic kidney disease)   . Coronary artery disease    a. s/p IMI 2004 tx with BMS to RCA;  b. s/p Promus DES to RCA 2/12 c. 06/2014 High risk stress test follow up cath 06/2014 showd patent stent--< med therapy for other mild to moedrate disease   . Coronary atherosclerosis of native coronary artery   . Depression    takes Cymbalta daily  . Diabetes mellitus    insulin daily  . Dysuria   . GERD (gastroesophageal reflux disease)    takes Protonix daily  . Gout,  unspecified    takes Colchicine daily  . Headache    occasionally  . History of blood clots about 65yrs ago   in legs-takes Coumadin   . Hyperlipidemia    takes Pravastatin daily  . Hypertension    takes Imdur and Metoprolol daily  . Insomnia   . Joint pain   . Joint swelling   . Myocardial infarction ([place]) 2004  . Numbness   . Obstructive sleep apnea    does not wear cpap  . Osteoarthritis   . Osteoarthritis   . Osteoarthrosis, unspecified whether generalized or localized, lower leg   . Pain, chronic   . Polymyalgia rheumatica ([place])   . Pulmonary emboli ([place]) 9/13   felt to need lifelong anticoagulation  . Shortness of breath dyspnea    with exertion and has Albuterol inhaler prn  . Type II or unspecified type diabetes mellitus without mention of complication, uncontrolled   . Urinary incontinence    takes Linzess daily  . Vertigo    hx of;was taking  Meclizine if needed  . Wears dentures     Family History  Problem Relation Age of Onset  . Breast cancer Mother 37  . Heart disease Mother   . Throat cancer Father   . Hypertension Father   . Arthritis Father   . Diabetes Father   . Arthritis Sister   . Obesity Sister   . Diabetes Sister   . Heart disease Cousin   . Colon cancer Neg Hx   . Stomach cancer Neg Hx   . Esophageal cancer Neg Hx     Past Surgical History:  Procedure Laterality Date  . ABDOMINAL HYSTERECTOMY     partial  . APPENDECTOMY    . blood clots/legs and lungs  2013  . BREAST BIOPSY Left 07/22/2014  . BREAST BIOPSY Left 02/10/2013  . BREAST LUMPECTOMY Left 11/05/2014  . BREAST LUMPECTOMY WITH RADIOACTIVE SEED LOCALIZATION Left 11/05/2014   Procedure: LEFT BREAST LUMPECTOMY WITH RADIOACTIVE SEED LOCALIZATION;  Surgeon: [name], MD;  Location: [facility];  Service: General;  Laterality: Left;  . CARDIAC CATHETERIZATION    . COLONOSCOPY    . CORONARY ANGIOPLASTY  2  . ESOPHAGOGASTRODUODENOSCOPY (EGD) WITH PROPOFOL N/A 11/07/2016   Procedure:  ESOPHAGOGASTRODUODENOSCOPY (EGD) WITH PROPOFOL;  Surgeon: Gatha Mayer, MD;  Location: [facility];  Service: Endoscopy;  Laterality: N/A;  . EXCISION OF SKIN TAG Right 11/05/2014   Procedure: EXCISION OF RIGHT EYELID SKIN TAG;  Surgeon: [name], MD;  Location: [facility];  Service: General;  Laterality: Right;  . EYE SURGERY Bilateral    cataract   . GASTRIC BYPASS  1977    reversed in 1979, Sarah Ann N/A 06/29/2014   Procedure: LEFT HEART CATHETERIZATION WITH CORONARY ANGIOGRAM;  Surgeon: Troy Sine, MD;  Location: [facility];  Service: Cardiovascular;  Laterality: N/A;  . MEMBRANE PEEL Right 10/23/2018   Procedure: MEMBRANE PEEL;  Surgeon: Hurman Horn, MD;  Location: [facility];  Service: Ophthalmology;  Laterality: Right;  . MI with stent placement  2004  . PARS PLANA VITRECTOMY Right 10/23/2018   Procedure: PARS PLANA VITRECTOMY WITH 25 GAUGE;  Surgeon: Hurman Horn, MD;  Location: [facility];  Service: Ophthalmology;  Laterality: Right;   Social History   Occupational History  . Occupation: retired  Tobacco Use  . Smoking status: Never Smoker  . Smokeless tobacco: Never Used  Substance and Sexual Activity  . Alcohol use: No    Alcohol/week: 0.0 standard drinks  . Drug use: No  . Sexual activity: Not Currently

## 2019-04-28 ENCOUNTER — Other Ambulatory Visit: Payer: Self-pay | Admitting: Nurse Practitioner

## 2019-04-28 DIAGNOSIS — R42 Dizziness and giddiness: Secondary | ICD-10-CM

## 2019-04-28 NOTE — Telephone Encounter (Signed)
Pending medication refill due to allergy/warning alert Routing to provider for approval.

## 2019-05-09 DIAGNOSIS — M353 Polymyalgia rheumatica: Secondary | ICD-10-CM | POA: Diagnosis not present

## 2019-05-09 DIAGNOSIS — N183 Chronic kidney disease, stage 3 unspecified: Secondary | ICD-10-CM | POA: Diagnosis not present

## 2019-05-09 DIAGNOSIS — M1030 Gout due to renal impairment, unspecified site: Secondary | ICD-10-CM | POA: Diagnosis not present

## 2019-05-09 DIAGNOSIS — E1122 Type 2 diabetes mellitus with diabetic chronic kidney disease: Secondary | ICD-10-CM | POA: Diagnosis not present

## 2019-05-09 DIAGNOSIS — F322 Major depressive disorder, single episode, severe without psychotic features: Secondary | ICD-10-CM | POA: Diagnosis not present

## 2019-05-09 DIAGNOSIS — I252 Old myocardial infarction: Secondary | ICD-10-CM | POA: Diagnosis not present

## 2019-05-09 DIAGNOSIS — I2510 Atherosclerotic heart disease of native coronary artery without angina pectoris: Secondary | ICD-10-CM | POA: Diagnosis not present

## 2019-05-09 DIAGNOSIS — M25551 Pain in right hip: Secondary | ICD-10-CM | POA: Diagnosis not present

## 2019-05-09 DIAGNOSIS — E1140 Type 2 diabetes mellitus with diabetic neuropathy, unspecified: Secondary | ICD-10-CM | POA: Diagnosis not present

## 2019-05-09 DIAGNOSIS — M1710 Unilateral primary osteoarthritis, unspecified knee: Secondary | ICD-10-CM | POA: Diagnosis not present

## 2019-05-09 DIAGNOSIS — I5032 Chronic diastolic (congestive) heart failure: Secondary | ICD-10-CM | POA: Diagnosis not present

## 2019-05-09 DIAGNOSIS — I130 Hypertensive heart and chronic kidney disease with heart failure and stage 1 through stage 4 chronic kidney disease, or unspecified chronic kidney disease: Secondary | ICD-10-CM | POA: Diagnosis not present

## 2019-05-09 DIAGNOSIS — G894 Chronic pain syndrome: Secondary | ICD-10-CM | POA: Diagnosis not present

## 2019-05-12 ENCOUNTER — Other Ambulatory Visit: Payer: Self-pay | Admitting: Nurse Practitioner

## 2019-05-14 ENCOUNTER — Telehealth: Payer: Self-pay

## 2019-05-14 NOTE — Telephone Encounter (Signed)
FYI, no action needed. Patient missed visit today. Angel with [facility] has made several attempts to contact patient with no return call. Visit for today cancled.   Next visit pending for 05/15/2018

## 2019-05-16 ENCOUNTER — Telehealth: Payer: Self-pay

## 2019-05-16 NOTE — Telephone Encounter (Signed)
She will need an evaluation for pain to be prescribed anything.  She can take tylenol OTC 500 mg 1-2 tablets by mouth every 8 hours as needed for pain- MAX of 6 tablets in 24 hours. If needed she can make appt be seen in office.

## 2019-05-16 NOTE — Telephone Encounter (Signed)
Patient Nurse "Glenard Haring" from [facility] called and stated that patient is complaining of being in intense pain. Patient states that the pain is in her Left Foot and Left Hand. Patient wanted to know if she should be prescribed some Gabapentin or some kind of Medical Ointment Cream to treat the pain. Please Advise.

## 2019-05-19 NOTE — Telephone Encounter (Signed)
Patient was called this morning and scheduled for next week on Monday 05/26/2019 at 3:15pm.

## 2019-05-26 ENCOUNTER — Other Ambulatory Visit: Payer: Self-pay

## 2019-05-26 ENCOUNTER — Ambulatory Visit (INDEPENDENT_AMBULATORY_CARE_PROVIDER_SITE_OTHER): Payer: PPO | Admitting: Nurse Practitioner

## 2019-05-26 ENCOUNTER — Encounter: Payer: Self-pay | Admitting: Nurse Practitioner

## 2019-05-26 VITALS — BP 136/78 | HR 69 | Temp 97.3°F | Wt 312.0 lb

## 2019-05-26 DIAGNOSIS — Z794 Long term (current) use of insulin: Secondary | ICD-10-CM | POA: Diagnosis not present

## 2019-05-26 DIAGNOSIS — R42 Dizziness and giddiness: Secondary | ICD-10-CM

## 2019-05-26 DIAGNOSIS — R5381 Other malaise: Secondary | ICD-10-CM

## 2019-05-26 DIAGNOSIS — E1121 Type 2 diabetes mellitus with diabetic nephropathy: Secondary | ICD-10-CM

## 2019-05-26 DIAGNOSIS — E1140 Type 2 diabetes mellitus with diabetic neuropathy, unspecified: Secondary | ICD-10-CM

## 2019-05-26 DIAGNOSIS — I10 Essential (primary) hypertension: Secondary | ICD-10-CM

## 2019-05-26 DIAGNOSIS — R413 Other amnesia: Secondary | ICD-10-CM

## 2019-05-26 DIAGNOSIS — E1165 Type 2 diabetes mellitus with hyperglycemia: Secondary | ICD-10-CM | POA: Diagnosis not present

## 2019-05-26 DIAGNOSIS — IMO0002 Reserved for concepts with insufficient information to code with codable children: Secondary | ICD-10-CM

## 2019-05-26 LAB — GLUCOSE, POCT (MANUAL RESULT ENTRY): POC Glucose: 131 mg/dl — AB (ref 70–99)

## 2019-05-26 NOTE — Progress Notes (Signed)
Careteam: Patient Care Team: [name], NP as PCP - General (Geriatric Medicine) [name], [name], MD (Inactive) as Consulting Physician (Cardiology) [name], MD as Consulting Physician (Ophthalmology) [name], MD as Consulting Physician (General Surgery) [name], MD (Inactive) as Referring Physician (Obstetrics and Gynecology) [name] [name], MD as Consulting Physician (Ophthalmology) [facility], [name], MD as Consulting Physician (Endocrinology) [name], MD as Consulting Physician (Hematology)  Advanced Directive information    Allergies  Allergen Reactions  . Sulfonamide Derivatives Swelling    Mouth swelling  . Aricept [Donepezil Hcl] Diarrhea and Nausea And Vomiting    GI upset/loose stools  . Tramadol Nausea And Vomiting    Chief Complaint  Patient presents with  . Acute Visit    Pain in left hand and foot. Patient states pain is all over   . Headache    Patient c/o headaches and thinks it is realted to medication   . Medication Management    Discuss why patient is on so many B/P medications      HPI: Patient is a 85 y.o. female for acute visit due to pain in her left hand and right foot.   Left hand- has been hurting "a good while" 8 months or longer. Reports there is a shooting pain/achy pain. Numb. Can not feel the things in her purse.    Foot pain-shooting pain in the top of her foot started around 8 months ago then reports it was her left foot. States  Unsure if she is taking the cymbalta  Hypertension- having increase in headaches and feels like it is associated with increase in headaches.  States her blood pressure medication was changed but reviewing medication list there has been no recent changes.   Sister who is here with her today is unaware of her current medication.   No pain in the head- but feeling dizzy and light headed  All she has eaten today is toast and some applesauce Unsure if she ate  dinner last night.   Review of Systems:  Review of Systems  Constitutional: Positive for malaise/fatigue. Negative for chills and fever.  Respiratory: Negative for shortness of breath.   Cardiovascular: Negative for chest pain, palpitations and leg swelling.  Genitourinary: Negative for dysuria and urgency.  Musculoskeletal: Positive for joint pain. Negative for back pain, myalgias and neck pain.  Skin: Negative.   Neurological: Positive for dizziness and sensory change. Negative for seizures, weakness and headaches.  Psychiatric/Behavioral: Positive for memory loss.    Past Medical History:  Diagnosis Date  . Allergy    takes Mucinex daily as needed  . Anemia, unspecified   . Anxiety    takes Clonazepam daily as needed  . Arthritis   . Cancer (HCC)    breast  . CHF (congestive heart failure) ([place])    takes Furosemide daily  . CKD (chronic kidney disease)   . Coronary artery disease    a. s/p IMI 2004 tx with BMS to RCA;  b. s/p Promus DES to RCA 2/12 c. 06/2014 High risk stress test follow up cath 06/2014 showd patent stent--< med therapy for other mild to moedrate disease   . Coronary atherosclerosis of native coronary artery   . Depression    takes Cymbalta daily  . Diabetes mellitus    insulin daily  . Dysuria   . GERD (gastroesophageal reflux disease)    takes Protonix daily  . Gout, unspecified    takes Colchicine daily  .  Headache    occasionally  . History of blood clots about 38yrs ago   in legs-takes Coumadin   . Hyperlipidemia    takes Pravastatin daily  . Hypertension    takes Imdur and Metoprolol daily  . Insomnia   . Joint pain   . Joint swelling   . Myocardial infarction ([place]) 2004  . Numbness   . Obstructive sleep apnea    does not wear cpap  . Osteoarthritis   . Osteoarthritis   . Osteoarthrosis, unspecified whether generalized or localized, lower leg   . Pain, chronic   . Polymyalgia rheumatica ([place])   . Pulmonary emboli ([place]) 9/13   felt to  need lifelong anticoagulation  . Shortness of breath dyspnea    with exertion and has Albuterol inhaler prn  . Type II or unspecified type diabetes mellitus without mention of complication, uncontrolled   . Urinary incontinence    takes Linzess daily  . Vertigo    hx of;was taking Meclizine if needed  . Wears dentures    Past Surgical History:  Procedure Laterality Date  . ABDOMINAL HYSTERECTOMY     partial  . APPENDECTOMY    . blood clots/legs and lungs  2013  . BREAST BIOPSY Left 07/22/2014  . BREAST BIOPSY Left 02/10/2013  . BREAST LUMPECTOMY Left 11/05/2014  . BREAST LUMPECTOMY WITH RADIOACTIVE SEED LOCALIZATION Left 11/05/2014   Procedure: LEFT BREAST LUMPECTOMY WITH RADIOACTIVE SEED LOCALIZATION;  Surgeon: [name], MD;  Location: [facility];  Service: General;  Laterality: Left;  . CARDIAC CATHETERIZATION    . COLONOSCOPY    . CORONARY ANGIOPLASTY  2  . ESOPHAGOGASTRODUODENOSCOPY (EGD) WITH PROPOFOL N/A 11/07/2016   Procedure: ESOPHAGOGASTRODUODENOSCOPY (EGD) WITH PROPOFOL;  Surgeon: Gatha Mayer, MD;  Location: [facility];  Service: Endoscopy;  Laterality: N/A;  . EXCISION OF SKIN TAG Right 11/05/2014   Procedure: EXCISION OF RIGHT EYELID SKIN TAG;  Surgeon: [name], MD;  Location: [facility];  Service: General;  Laterality: Right;  . EYE SURGERY Bilateral    cataract   . GASTRIC BYPASS  1977    reversed in 1979, [place] N/A 06/29/2014   Procedure: LEFT HEART CATHETERIZATION WITH CORONARY ANGIOGRAM;  Surgeon: Troy Sine, MD;  Location: [facility] CATH LAB;  Service: Cardiovascular;  Laterality: N/A;  . MEMBRANE PEEL Right 10/23/2018   Procedure: MEMBRANE PEEL;  Surgeon: Hurman Horn, MD;  Location: [facility];  Service: Ophthalmology;  Laterality: Right;  . MI with stent placement  2004  . PARS PLANA VITRECTOMY Right 10/23/2018   Procedure: PARS PLANA VITRECTOMY WITH 25 GAUGE;  Surgeon: Hurman Horn, MD;   Location: [facility];  Service: Ophthalmology;  Laterality: Right;   Social History:   reports that she has never smoked. She has never used smokeless tobacco. She reports that she does not drink alcohol or use drugs.  Family History  Problem Relation Age of Onset  . Breast cancer Mother 67  . Heart disease Mother   . Throat cancer Father   . Hypertension Father   . Arthritis Father   . Diabetes Father   . Arthritis Sister   . Obesity Sister   . Diabetes Sister   . Heart disease Cousin   . Colon cancer Neg Hx   . Stomach cancer Neg Hx   . Esophageal cancer Neg Hx     Medications: Patient's Medications  New Prescriptions   No medications on file  Previous  Medications   ALBUTEROL (PROAIR HFA) 108 (90 BASE) MCG/ACT INHALER    Inhale 1-2 puffs into the lungs every 6 (six) hours as needed for wheezing or shortness of breath.   ANASTROZOLE (ARIMIDEX) 1 MG TABLET    TAKE 1 TABLET BY MOUTH EVERY DAY   BUDESONIDE-FORMOTEROL (SYMBICORT) 160-4.5 MCG/ACT INHALER    Inhale 2 puffs into the lungs 2 (two) times daily. For bronchitis   CONTINUOUS BLOOD GLUC RECEIVER (FREESTYLE LIBRE 14 DAY READER) DEVI    1 Device by Does not apply route 4 (four) times daily -  before meals and at bedtime. For diabetes   CONTINUOUS BLOOD GLUC SENSOR (FREESTYLE LIBRE 14 DAY SENSOR) MISC    1 Units by Does not apply route 4 (four) times daily -  before meals and at bedtime. For diabetes   DULOXETINE (CYMBALTA) 60 MG CAPSULE    TAKE 1 CAPSULE BY MOUTH EVERY DAY FOR ANXIETY   FEBUXOSTAT (ULORIC) 40 MG TABLET    TAKE 1 TABLET BY MOUTH EVERY DAY   FLUTICASONE (FLONASE) 50 MCG/ACT NASAL SPRAY    Place 2 sprays into both nostrils daily.   FUROSEMIDE (LASIX) 20 MG TABLET    TAKE 1 TABLET BY MOUTH EVERY DAY AS NEEDED   HYDRALAZINE (APRESOLINE) 50 MG TABLET    TAKE 1 TABLET BY MOUTH 2 TIMES DAILY   INSULIN ASPART (NOVOLOG FLEXPEN) 100 UNIT/ML FLEXPEN    Inject 16 Units into the skin 3 (three) times daily with meals. Max daily  dose 70 units   INSULIN GLARGINE, 2 UNIT DIAL, (TOUJEO MAX SOLOSTAR) 300 UNIT/ML SOPN    Inject 44 Units into the skin daily. Patient assistance program provides   ISOSORBIDE MONONITRATE (IMDUR) 60 MG 24 HR TABLET    TAKE 1 TABLET BY MOUTH EVERY DAY   LORATADINE (CLARITIN) 10 MG TABLET    Take 1 tablet (10 mg total) by mouth daily.   MECLIZINE (ANTIVERT) 25 MG TABLET    TAKE 1 TABLET BY MOUTH THREE TIMES DAILY AS NEEDED FOR DIZZINESS   MEMANTINE (NAMENDA) 5 MG TABLET    TAKE 2 TABLETS BY MOUTH EVERY MORNING and TAKE 1 TABLET BY MOUTH EVERY EVENING FOR MEMORY   METOPROLOL SUCCINATE (TOPROL-XL) 25 MG 24 HR TABLET    TAKE 1 TABLET BY MOUTH EVERY DAY FOR BLOOD PRESSURE   NITROGLYCERIN (NITROSTAT) 0.4 MG SL TABLET    Take one tablet under the tongue every 5 minutes as needed for chest pain   PANTOPRAZOLE (PROTONIX) 40 MG TABLET    TAKE 1 TABLET BY MOUTH EVERY DAY   PRAVASTATIN (PRAVACHOL) 20 MG TABLET    TAKE 1 TABLET BY MOUTH EVERY DAY FOR cholesterol   SODIUM POLYSTYRENE (KAYEXALATE) 15 GM/60ML SUSPENSION    15 gm by mouth weekly to keep potassium down.   TOPIRAMATE (TOPAMAX) 25 MG TABLET    TAKE 1 TABLET BY MOUTH DAILY   XARELTO 10 MG TABS TABLET    TAKE 1 TABLET BY MOUTH EVERY DAY  Modified Medications   No medications on file  Discontinued Medications   CONTINUOUS BLOOD GLUC RECEIVER (FREESTYLE LIBRE 14 DAY READER) DEVI    1 Device by Does not apply route as directed.    Physical Exam:  Vitals:   05/26/19 1450  BP: 136/78  Pulse: 69  Temp: (!) 97.3 F (36.3 C)  TempSrc: Temporal  SpO2: 96%  Weight: (!) 312 lb (141.5 kg)   Body mass index is 48.87 kg/m. Wt Readings from Last 3  Encounters:  05/26/19 (!) 312 lb (141.5 kg)  03/31/19 300 lb (136.1 kg)  03/24/19 (!) 330 lb (149.7 kg)    Physical Exam Constitutional:      General: She is not in acute distress.    Appearance: She is well-developed. She is not diaphoretic.  HENT:     Head: Normocephalic and atraumatic.  Eyes:      Conjunctiva/sclera: Conjunctivae normal.     Pupils: Pupils are equal, round, and reactive to light.  Cardiovascular:     Rate and Rhythm: Normal rate and regular rhythm.     Heart sounds: Normal heart sounds.  Pulmonary:     Effort: Pulmonary effort is normal.     Breath sounds: Normal breath sounds.  Abdominal:     General: Bowel sounds are normal.     Palpations: Abdomen is soft.  Musculoskeletal:        General: No tenderness.     Cervical back: Normal range of motion and neck supple.  Skin:    General: Skin is warm and dry.  Neurological:     Mental Status: She is alert and oriented to person, place, and time.     Sensory: Sensation is intact. No sensory deficit.     Gait: Gait abnormal (wheelchair bound).  Psychiatric:     Comments: Poor historian     Labs reviewed: Basic Metabolic Panel: Recent Labs    01/29/19 0310 01/30/19 0240 02/12/19 1129  NA 134* 134* 140  K 4.8 4.9 4.7  CL 100 102 106  CO2 23 24 28   GLUCOSE 256* 246* 218*  BUN 49* 52* 31*  CREATININE 1.39* 1.31* 1.52*  CALCIUM 9.0 8.9 8.9   Liver Function Tests: Recent Labs    01/28/19 0220 01/28/19 0220 01/29/19 0310 01/30/19 0240 02/12/19 1129  AST 19   < > 18 17 18   ALT 14   < > 14 16 16   ALKPHOS 57  --  63 61  --   BILITOT 0.5   < > 0.2* 0.3 0.5  PROT 7.4   < > 7.6 7.2 6.6  ALBUMIN 2.8*  --  3.0* 2.8*  --    < > = values in this interval not displayed.   Recent Labs    08/27/18 1355  LIPASE 39   No results for input(s): AMMONIA in the last 8760 hours. CBC: Recent Labs    01/29/19 0310 01/30/19 0240 02/12/19 1129  WBC 8.9 11.1* 7.2  NEUTROABS 6.7 8.1* 4,176  HGB 9.6* 8.9* 9.1*  HCT 30.1* 27.5* 28.0*  MCV 92.3 91.1 90.0  PLT 335 315 200   Lipid Panel: Recent Labs    07/17/18 1141  CHOL 90  HDL 35*  LDLCALC 40  TRIG 74  CHOLHDL 2.6   TSH: No results for input(s): TSH in the last 8760 hours. A1C: Lab Results  Component Value Date   HGBA1C 9.8 (A) 03/24/2019      Assessment/Plan 1. Uncontrolled type 2 diabetes mellitus with diabetic nephropathy, with long-term current use of insulin ([place]) -has been compliant with medication but not eating well.  See number 4 - POC Glucose (CBG) 131 done in office to make sure blood sugar   2. Dizziness Noted to have dizziness today. Takes meclizine PRN. Question if dizziness and lightheadness related to decrease in oral intake, has not eaten any protein today (1530 appt time) and misses several days of medication before taking dose. Encouraged compliance with diet and medications as going several days without taking  and then restarting could cause symptoms.   3. Debility Ongoing, encouraged proper nutrition to help with strength.   4. Type 2 diabetes mellitus with diabetic neuropathy, with long-term current use of insulin ([place]) -uncontrolled. Following with endocrinologist. Encouraged proper nutrition to full body and help her feel better. Appears she is not getting enough protein but eats snacky foods. Encouraged 3 meals a day.  -currently on cymbalta which could help nerve pain but unsure if she is taking- sister will follow up on this first but may benefit from a low dose gabapenin.  5. Memory loss Ongoing memory deficit. Does not remember what medication she is taking. Daughter and sister helping with her care. Decrease oral intake which likely due to memory loss.   6. Essential hypertension -blood pressure is well controlled. Encouraged to continue medication at current dose with DASH diet.   Next appt: 3 months.  Carlos American. [place], [facility] [phone]

## 2019-05-26 NOTE — Patient Instructions (Addendum)
You can take tylenol 500 mg 1-2 tablets every 8 hours as needed for pain  For nerve pain- make sure you are taking the cymbalta 60 mg routinely- if you are not taking this please notify us We can add additional medication if needed  Make sure you are eating 3 meals a day with proper calories and protein- you have diabetes and if your blood sugar gets low this can make you feel off and weak  If you go several days without taking medication and then restart full dose this can make you dizzy or light headed as well    Diabetes Mellitus and Nutrition, Adult When you have diabetes (diabetes mellitus), it is very important to have healthy eating habits because your blood sugar (glucose) levels are greatly affected by what you eat and drink. Eating healthy foods in the appropriate amounts, at about the same times every day, can help you:  Control your blood glucose.  Lower your risk of heart disease.  Improve your blood pressure.  Reach or maintain a healthy weight. Every person with diabetes is different, and each person has different needs for a meal plan. Your health care provider may recommend that you work with a diet and nutrition specialist (dietitian) to make a meal plan that is best for you. Your meal plan may vary depending on factors such as:  The calories you need.  The medicines you take.  Your weight.  Your blood glucose, blood pressure, and cholesterol levels.  Your activity level.  Other health conditions you have, such as heart or kidney disease. How do carbohydrates affect me? Carbohydrates, also called carbs, affect your blood glucose level more than any other type of food. Eating carbs naturally raises the amount of glucose in your blood. Carb counting is a method for keeping track of how many carbs you eat. Counting carbs is important to keep your blood glucose at a healthy level, especially if you use insulin or take certain oral diabetes medicines. It is important  to know how many carbs you can safely have in each meal. This is different for every person. Your dietitian can help you calculate how many carbs you should have at each meal and for each snack. Foods that contain carbs include:  Bread, cereal, rice, pasta, and crackers.  Potatoes and corn.  Peas, beans, and lentils.  Milk and yogurt.  Fruit and juice.  Desserts, such as cakes, cookies, ice cream, and candy. How does alcohol affect me? Alcohol can cause a sudden decrease in blood glucose (hypoglycemia), especially if you use insulin or take certain oral diabetes medicines. Hypoglycemia can be a life-threatening condition. Symptoms of hypoglycemia (sleepiness, dizziness, and confusion) are similar to symptoms of having too much alcohol. If your health care provider says that alcohol is safe for you, follow these guidelines:  Limit alcohol intake to no more than 1 drink per day for nonpregnant women and 2 drinks per day for men. One drink equals 12 oz of beer, 5 oz of wine, or 1 oz of hard liquor.  Do not drink on an empty stomach.  Keep yourself hydrated with water, diet soda, or unsweetened iced tea.  Keep in mind that regular soda, juice, and other mixers may contain a lot of sugar and must be counted as carbs. What are tips for following this plan?  Reading food labels  Start by checking the serving size on the "Nutrition Facts" label of packaged foods and drinks. The amount of calories, carbs, fats,  and other nutrients listed on the label is based on one serving of the item. Many items contain more than one serving per package.  Check the total grams (g) of carbs in one serving. You can calculate the number of servings of carbs in one serving by dividing the total carbs by 15. For example, if a food has 30 g of total carbs, it would be equal to 2 servings of carbs.  Check the number of grams (g) of saturated and trans fats in one serving. Choose foods that have low or no amount  of these fats.  Check the number of milligrams (mg) of salt (sodium) in one serving. Most people should limit total sodium intake to less than 2,300 mg per day.  Always check the nutrition information of foods labeled as "low-fat" or "nonfat". These foods may be higher in added sugar or refined carbs and should be avoided.  Talk to your dietitian to identify your daily goals for nutrients listed on the label. Shopping  Avoid buying canned, premade, or processed foods. These foods tend to be high in fat, sodium, and added sugar.  Shop around the outside edge of the grocery store. This includes fresh fruits and vegetables, bulk grains, fresh meats, and fresh dairy. Cooking  Use low-heat cooking methods, such as baking, instead of high-heat cooking methods like deep frying.  Cook using healthy oils, such as olive, canola, or sunflower oil.  Avoid cooking with butter, cream, or high-fat meats. Meal planning  Eat meals and snacks regularly, preferably at the same times every day. Avoid going long periods of time without eating.  Eat foods high in fiber, such as fresh fruits, vegetables, beans, and whole grains. Talk to your dietitian about how many servings of carbs you can eat at each meal.  Eat 4-6 ounces (oz) of lean protein each day, such as lean meat, chicken, fish, eggs, or tofu. One oz of lean protein is equal to: ? 1 oz of meat, chicken, or fish. ? 1 egg. ?  cup of tofu.  Eat some foods each day that contain healthy fats, such as avocado, nuts, seeds, and fish. Lifestyle  Check your blood glucose regularly.  Exercise regularly as told by your health care provider. This may include: ? 150 minutes of moderate-intensity or vigorous-intensity exercise each week. This could be brisk walking, biking, or water aerobics. ? Stretching and doing strength exercises, such as yoga or weightlifting, at least 2 times a week.  Take medicines as told by your health care provider.  Do not  use any products that contain nicotine or tobacco, such as cigarettes and e-cigarettes. If you need help quitting, ask your health care provider.  Work with a Social worker or diabetes educator to identify strategies to manage stress and any emotional and social challenges. Questions to ask a health care provider  Do I need to meet with a diabetes educator?  Do I need to meet with a dietitian?  What number can I call if I have questions?  When are the best times to check my blood glucose? Where to find more information:  American Diabetes Association: diabetes.org  Academy of Nutrition and Dietetics: www.eatright.[employer] of Diabetes and Digestive and Kidney Diseases (NIH): [employer] Summary  A healthy meal plan will help you control your blood glucose and maintain a healthy lifestyle.  Working with a diet and nutrition specialist (dietitian) can help you make a meal plan that is best for you.  Keep in mind  that carbohydrates (carbs) and alcohol have immediate effects on your blood glucose levels. It is important to count carbs and to use alcohol carefully. This information is not intended to replace advice given to you by your health care provider. Make sure you discuss any questions you have with your health care provider. Document Revised: 04/06/2017 Document Reviewed: 05/29/2016 Elsevier Patient Education  2020 [employer].

## 2019-05-28 ENCOUNTER — Encounter: Payer: Self-pay | Admitting: Nurse Practitioner

## 2019-05-28 ENCOUNTER — Other Ambulatory Visit: Payer: Self-pay

## 2019-05-28 ENCOUNTER — Ambulatory Visit (INDEPENDENT_AMBULATORY_CARE_PROVIDER_SITE_OTHER): Payer: PPO | Admitting: Nurse Practitioner

## 2019-05-28 DIAGNOSIS — G894 Chronic pain syndrome: Secondary | ICD-10-CM

## 2019-05-28 DIAGNOSIS — M25512 Pain in left shoulder: Secondary | ICD-10-CM

## 2019-05-28 DIAGNOSIS — E1142 Type 2 diabetes mellitus with diabetic polyneuropathy: Secondary | ICD-10-CM

## 2019-05-28 DIAGNOSIS — G8929 Other chronic pain: Secondary | ICD-10-CM

## 2019-05-28 NOTE — Telephone Encounter (Signed)
Called to inform pt that sample of tresiba is available and that she should take the exact same dosage that she takes with her toujeo.

## 2019-05-28 NOTE — Telephone Encounter (Signed)
Pt called and stated that she is on her last pen of tresiba and that she has not received any information from [employer] regarding her application. She wanted to know if we could provide samples until she spoke to the company. We however do not have any samples, please advise.

## 2019-05-28 NOTE — Progress Notes (Signed)
This service is provided via telemedicine  No vital signs collected/recorded due to the encounter was a telemedicine visit.   Location of patient (ex: home, work):  Home   Patient consents to a telephone visit:  Yes  Location of the provider (ex: office, home):  [facility], Office   Name of any referring provider: N/A  Names of all persons participating in the telemedicine service and their role in the encounter:  S.Chrae B/CMA, [name], NP, Lavarious, and Patient   Time spent on call:  6 min with medical assistant     Careteam: Patient Care Team: [name], NP as PCP - General (Geriatric Medicine) [name], [name], MD (Inactive) as Consulting Physician (Cardiology) [name], MD as Consulting Physician (Ophthalmology) [name], MD as Consulting Physician (General Surgery) [name], MD (Inactive) as Referring Physician (Obstetrics and Gynecology) [name] [name], MD as Consulting Physician (Ophthalmology) [facility], [name], MD as Consulting Physician (Endocrinology) [name], MD as Consulting Physician (Hematology)  Advanced Directive information    Allergies  Allergen Reactions  . Sulfonamide Derivatives Swelling    Mouth swelling  . Aricept [Donepezil Hcl] Diarrhea and Nausea And Vomiting    GI upset/loose stools  . Tramadol Nausea And Vomiting    Chief Complaint  Patient presents with  . Acute Visit    Referral request, ongoing pain. Telehealth      HPI: Patient is a 85 y.o. female to follow up on pain.  Sister went home and confirmed she has been taking the cymbalta.  Reports her hands are still hurting.  Discussing of starting gabapentin due to neuropathy but she does not wish to do that She reports right shoulder pain as well- she has not tired anything to help this pain. Does not wish to take tylenol and says "I would rather have referral to pain management" her sister went to pain management  and she is able to walk around.   Review of Systems:  Review of Systems  Constitutional: Negative for chills and fever.  Musculoskeletal: Positive for joint pain, myalgias and neck pain.  Neurological: Positive for tingling, sensory change and weakness.   Past Medical History:  Diagnosis Date  . Allergy    takes Mucinex daily as needed  . Anemia, unspecified   . Anxiety    takes Clonazepam daily as needed  . Arthritis   . Cancer (HCC)    breast  . CHF (congestive heart failure) ([place])    takes Furosemide daily  . CKD (chronic kidney disease)   . Coronary artery disease    a. s/p IMI 2004 tx with BMS to RCA;  b. s/p Promus DES to RCA 2/12 c. 06/2014 High risk stress test follow up cath 06/2014 showd patent stent--< med therapy for other mild to moedrate disease   . Coronary atherosclerosis of native coronary artery   . Depression    takes Cymbalta daily  . Diabetes mellitus    insulin daily  . Dysuria   . GERD (gastroesophageal reflux disease)    takes Protonix daily  . Gout, unspecified    takes Colchicine daily  . Headache    occasionally  . History of blood clots about 13yrs ago   in legs-takes Coumadin   . Hyperlipidemia    takes Pravastatin daily  . Hypertension    takes Imdur and Metoprolol daily  . Insomnia   . Joint pain   . Joint swelling   . Myocardial infarction ([place]) 2004  .  Numbness   . Obstructive sleep apnea    does not wear cpap  . Osteoarthritis   . Osteoarthritis   . Osteoarthrosis, unspecified whether generalized or localized, lower leg   . Pain, chronic   . Polymyalgia rheumatica ([place])   . Pulmonary emboli ([place]) 9/13   felt to need lifelong anticoagulation  . Shortness of breath dyspnea    with exertion and has Albuterol inhaler prn  . Type II or unspecified type diabetes mellitus without mention of complication, uncontrolled   . Urinary incontinence    takes Linzess daily  . Vertigo    hx of;was taking Meclizine if needed  . Wears dentures     Past Surgical History:  Procedure Laterality Date  . ABDOMINAL HYSTERECTOMY     partial  . APPENDECTOMY    . blood clots/legs and lungs  2013  . BREAST BIOPSY Left 07/22/2014  . BREAST BIOPSY Left 02/10/2013  . BREAST LUMPECTOMY Left 11/05/2014  . BREAST LUMPECTOMY WITH RADIOACTIVE SEED LOCALIZATION Left 11/05/2014   Procedure: LEFT BREAST LUMPECTOMY WITH RADIOACTIVE SEED LOCALIZATION;  Surgeon: [name], MD;  Location: [place];  Service: General;  Laterality: Left;  . CARDIAC CATHETERIZATION    . COLONOSCOPY    . CORONARY ANGIOPLASTY  2  . ESOPHAGOGASTRODUODENOSCOPY (EGD) WITH PROPOFOL N/A 11/07/2016   Procedure: ESOPHAGOGASTRODUODENOSCOPY (EGD) WITH PROPOFOL;  Surgeon: Gatha Mayer, MD;  Location: [facility];  Service: Endoscopy;  Laterality: N/A;  . EXCISION OF SKIN TAG Right 11/05/2014   Procedure: EXCISION OF RIGHT EYELID SKIN TAG;  Surgeon: [name], MD;  Location: [place];  Service: General;  Laterality: Right;  . EYE SURGERY Bilateral    cataract   . GASTRIC BYPASS  1977    reversed in 1979, [place] N/A 06/29/2014   Procedure: LEFT HEART CATHETERIZATION WITH CORONARY ANGIOGRAM;  Surgeon: Troy Sine, MD;  Location: [facility] CATH LAB;  Service: Cardiovascular;  Laterality: N/A;  . MEMBRANE PEEL Right 10/23/2018   Procedure: MEMBRANE PEEL;  Surgeon: Hurman Horn, MD;  Location: [place];  Service: Ophthalmology;  Laterality: Right;  . MI with stent placement  2004  . PARS PLANA VITRECTOMY Right 10/23/2018   Procedure: PARS PLANA VITRECTOMY WITH 25 GAUGE;  Surgeon: Hurman Horn, MD;  Location: [place];  Service: Ophthalmology;  Laterality: Right;   Social History:   reports that she has never smoked. She has never used smokeless tobacco. She reports that she does not drink alcohol or use drugs.  Family History  Problem Relation Age of Onset  . Breast cancer Mother 90  . Heart disease Mother   . Throat  cancer Father   . Hypertension Father   . Arthritis Father   . Diabetes Father   . Arthritis Sister   . Obesity Sister   . Diabetes Sister   . Heart disease Cousin   . Colon cancer Neg Hx   . Stomach cancer Neg Hx   . Esophageal cancer Neg Hx     Medications: Patient's Medications  New Prescriptions   No medications on file  Previous Medications   ALBUTEROL (PROAIR HFA) 108 (90 BASE) MCG/ACT INHALER    Inhale 1-2 puffs into the lungs every 6 (six) hours as needed for wheezing or shortness of breath.   ANASTROZOLE (ARIMIDEX) 1 MG TABLET    TAKE 1 TABLET BY MOUTH EVERY DAY   BUDESONIDE-FORMOTEROL (SYMBICORT) 160-4.5 MCG/ACT INHALER    Inhale 2 puffs  into the lungs 2 (two) times daily. For bronchitis   CONTINUOUS BLOOD GLUC RECEIVER (FREESTYLE LIBRE 14 DAY READER) DEVI    1 Device by Does not apply route 4 (four) times daily -  before meals and at bedtime. For diabetes   CONTINUOUS BLOOD GLUC SENSOR (FREESTYLE LIBRE 14 DAY SENSOR) MISC    1 Units by Does not apply route 4 (four) times daily -  before meals and at bedtime. For diabetes   DULOXETINE (CYMBALTA) 60 MG CAPSULE    TAKE 1 CAPSULE BY MOUTH EVERY DAY FOR ANXIETY   FEBUXOSTAT (ULORIC) 40 MG TABLET    TAKE 1 TABLET BY MOUTH EVERY DAY   FLUTICASONE (FLONASE) 50 MCG/ACT NASAL SPRAY    Place 2 sprays into both nostrils daily.   FUROSEMIDE (LASIX) 20 MG TABLET    TAKE 1 TABLET BY MOUTH EVERY DAY AS NEEDED   HYDRALAZINE (APRESOLINE) 50 MG TABLET    TAKE 1 TABLET BY MOUTH 2 TIMES DAILY   INSULIN ASPART (NOVOLOG FLEXPEN) 100 UNIT/ML FLEXPEN    Inject 16 Units into the skin 3 (three) times daily with meals. Max daily dose 70 units   INSULIN GLARGINE, 2 UNIT DIAL, (TOUJEO MAX SOLOSTAR) 300 UNIT/ML SOPN    Inject 44 Units into the skin daily. Patient assistance program provides   ISOSORBIDE MONONITRATE (IMDUR) 60 MG 24 HR TABLET    TAKE 1 TABLET BY MOUTH EVERY DAY   LORATADINE (CLARITIN) 10 MG TABLET    Take 1 tablet (10 mg total) by mouth  daily.   MECLIZINE (ANTIVERT) 25 MG TABLET    TAKE 1 TABLET BY MOUTH THREE TIMES DAILY AS NEEDED FOR DIZZINESS   MEMANTINE (NAMENDA) 5 MG TABLET    TAKE 2 TABLETS BY MOUTH EVERY MORNING and TAKE 1 TABLET BY MOUTH EVERY EVENING FOR MEMORY   METOPROLOL SUCCINATE (TOPROL-XL) 25 MG 24 HR TABLET    TAKE 1 TABLET BY MOUTH EVERY DAY FOR BLOOD PRESSURE   NITROGLYCERIN (NITROSTAT) 0.4 MG SL TABLET    Take one tablet under the tongue every 5 minutes as needed for chest pain   PANTOPRAZOLE (PROTONIX) 40 MG TABLET    TAKE 1 TABLET BY MOUTH EVERY DAY   PRAVASTATIN (PRAVACHOL) 20 MG TABLET    TAKE 1 TABLET BY MOUTH EVERY DAY FOR cholesterol   SODIUM POLYSTYRENE (KAYEXALATE) 15 GM/60ML SUSPENSION    15 gm by mouth weekly to keep potassium down.   TOPIRAMATE (TOPAMAX) 25 MG TABLET    TAKE 1 TABLET BY MOUTH DAILY   XARELTO 10 MG TABS TABLET    TAKE 1 TABLET BY MOUTH EVERY DAY  Modified Medications   No medications on file  Discontinued Medications   No medications on file    Physical Exam:  There were no vitals filed for this visit. There is no height or weight on file to calculate BMI. Wt Readings from Last 3 Encounters:  05/26/19 (!) 312 lb (141.5 kg)  03/31/19 300 lb (136.1 kg)  03/24/19 (!) 330 lb (149.7 kg)      Labs reviewed: Basic Metabolic Panel: Recent Labs    01/29/19 0310 01/30/19 0240 02/12/19 1129  NA 134* 134* 140  K 4.8 4.9 4.7  CL 100 102 106  CO2 23 24 28   GLUCOSE 256* 246* 218*  BUN 49* 52* 31*  CREATININE 1.39* 1.31* 1.52*  CALCIUM 9.0 8.9 8.9   Liver Function Tests: Recent Labs    01/28/19 0220 01/28/19 0220 01/29/19 0310 01/30/19 0240  02/12/19 1129  AST 19   < > 18 17 18   ALT 14   < > 14 16 16   ALKPHOS 57  --  63 61  --   BILITOT 0.5   < > 0.2* 0.3 0.5  PROT 7.4   < > 7.6 7.2 6.6  ALBUMIN 2.8*  --  3.0* 2.8*  --    < > = values in this interval not displayed.   Recent Labs    08/27/18 1355  LIPASE 39   No results for input(s): AMMONIA in the last  8760 hours. CBC: Recent Labs    01/29/19 0310 01/30/19 0240 02/12/19 1129  WBC 8.9 11.1* 7.2  NEUTROABS 6.7 8.1* 4,176  HGB 9.6* 8.9* 9.1*  HCT 30.1* 27.5* 28.0*  MCV 92.3 91.1 90.0  PLT 335 315 200   Lipid Panel: Recent Labs    07/17/18 1141  CHOL 90  HDL 35*  LDLCALC 40  TRIG 74  CHOLHDL 2.6   TSH: No results for input(s): TSH in the last 8760 hours. A1C: Lab Results  Component Value Date   HGBA1C 9.8 (A) 03/24/2019     Assessment/Plan 1. Chronic pain syndrome -pt with hx of chronic pain, previously on oxycodone but had not taken for over a month and therefore was stopped due to risk of adverse effect. Discussed management with PT and orthopedic. She appears to have more of an neuropathic pain at this time. Hx of uncontrolled diabetes.  Does not wish to start any new medication recommended or OTC but would like referral and evaluation by pain clinic. Referral placed  - Ambulatory referral to Pain Clinic  2. Chronic left shoulder pain Ongoing. Would like pain clinic referral for evaluation. - Ambulatory referral to Pain Clinic  3. Diabetic polyneuropathy associated with type 2 diabetes mellitus ([place]) -appears a lot of her pain is due to neuropathy. Not interested in starting gabapentin but would like pain management to evaluate her.  - Ambulatory referral to Pain Clinic  Next appt: 09/12/2019 as scheduled, sooner if needed Olaoluwa Grieder K. Harle Battiest  [facility] [phone]    Virtual Visit via Telephone Note  I connected with@ on 05/28/19 at 11:00 AM EST by telephone and verified that I am speaking with the correct person using two identifiers.  Location: Patient: home Provider: office   I discussed the limitations, risks, security and privacy concerns of performing an evaluation and management service by telephone and the availability of in person appointments. I also discussed with the patient that there may be a patient  responsible charge related to this service. The patient expressed understanding and agreed to proceed.   I discussed the assessment and treatment plan with the patient. The patient was provided an opportunity to ask questions and all were answered. The patient agreed with the plan and demonstrated an understanding of the instructions.   The patient was advised to call back or seek an in-person evaluation if the symptoms worsen or if the condition fails to improve as anticipated.  I provided 12 minutes of non-face-to-face time during this encounter.  Carlos American. Harle Battiest Avs printed and mailed

## 2019-05-28 NOTE — Telephone Encounter (Signed)
Spoke to pt and she stated that she was able to contact [employer] and that she was instructed that she needed to fill out another application for this year

## 2019-05-28 NOTE — Telephone Encounter (Signed)
Pt is on Toujeo and we do not have any samples of that or lantus, please advise.

## 2019-06-03 ENCOUNTER — Other Ambulatory Visit: Payer: Self-pay

## 2019-06-03 ENCOUNTER — Ambulatory Visit (INDEPENDENT_AMBULATORY_CARE_PROVIDER_SITE_OTHER): Payer: PPO | Admitting: Family

## 2019-06-03 ENCOUNTER — Encounter: Payer: Self-pay | Admitting: Family

## 2019-06-03 VITALS — BP 140/70 | HR 75 | Temp 97.9°F | Ht 67.0 in | Wt 325.0 lb

## 2019-06-03 DIAGNOSIS — R399 Unspecified symptoms and signs involving the genitourinary system: Secondary | ICD-10-CM | POA: Diagnosis not present

## 2019-06-03 LAB — POCT URINALYSIS DIPSTICK
Bilirubin, UA: NEGATIVE
Blood, UA: NEGATIVE
Glucose, UA: NEGATIVE
Ketones, UA: NEGATIVE
Nitrite, UA: NEGATIVE
Protein, UA: NEGATIVE
Spec Grav, UA: 1.015 (ref 1.010–1.025)
Urobilinogen, UA: 0.2 E.U./dL
pH, UA: 6 (ref 5.0–8.0)

## 2019-06-03 MED ORDER — CRANBERRY 475 MG PO CAPS: 475.0000 mg | ORAL_CAPSULE | Freq: Two times a day (BID) | ORAL | 3 refills | Status: AC

## 2019-06-03 NOTE — Patient Instructions (Signed)
Take Cranberry tablet one by mouth twice daily for signs of urinary tract infection.  - continue to increase your fluid intake  -Notify provider if running fever,chills or symptoms worsen.     Urinary Tract Infection, Adult A urinary tract infection (UTI) is an infection of any part of the urinary tract. The urinary tract includes:  The kidneys.  The ureters.  The bladder.  The urethra. These organs make, store, and get rid of pee (urine) in the body. What are the causes? This is caused by germs (bacteria) in your genital area. These germs grow and cause swelling (inflammation) of your urinary tract. What increases the risk? You are more likely to develop this condition if:  You have a small, thin tube (catheter) to drain pee.  You cannot control when you pee or poop (incontinence).  You are female, and: ? You use these methods to prevent pregnancy:  A medicine that kills sperm (spermicide).  A device that blocks sperm (diaphragm). ? You have low levels of a female hormone (estrogen). ? You are pregnant.  You have genes that add to your risk.  You are sexually active.  You take antibiotic medicines.  You have trouble peeing because of: ? A prostate that is bigger than normal, if you are female. ? A blockage in the part of your body that drains pee from the bladder (urethra). ? A kidney stone. ? A nerve condition that affects your bladder (neurogenic bladder). ? Not getting enough to drink. ? Not peeing often enough.  You have other conditions, such as: ? Diabetes. ? A weak disease-fighting system (immune system). ? Sickle cell disease. ? Gout. ? Injury of the spine. What are the signs or symptoms? Symptoms of this condition include:  Needing to pee right away (urgently).  Peeing often.  Peeing small amounts often.  Pain or burning when peeing.  Blood in the pee.  Pee that smells bad or not like normal.  Trouble peeing.  Pee that is  cloudy.  Fluid coming from the vagina, if you are female.  Pain in the belly or lower back. Other symptoms include:  Throwing up (vomiting).  No urge to eat.  Feeling mixed up (confused).  Being tired and grouchy (irritable).  A fever.  Watery poop (diarrhea). How is this treated? This condition may be treated with:  Antibiotic medicine.  Other medicines.  Drinking enough water. Follow these instructions at home:  Medicines  Take over-the-counter and prescription medicines only as told by your doctor.  If you were prescribed an antibiotic medicine, take it as told by your doctor. Do not stop taking it even if you start to feel better. General instructions  Make sure you: ? Pee until your bladder is empty. ? Do not hold pee for a long time. ? Empty your bladder after sex. ? Wipe from front to back after pooping if you are a female. Use each tissue one time when you wipe.  Drink enough fluid to keep your pee pale yellow.  Keep all follow-up visits as told by your doctor. This is important. Contact a doctor if:  You do not get better after 1-2 days.  Your symptoms go away and then come back. Get help right away if:  You have very bad back pain.  You have very bad pain in your lower belly.  You have a fever.  You are sick to your stomach (nauseous).  You are throwing up. Summary  A urinary tract infection (UTI) is  an infection of any part of the urinary tract.  This condition is caused by germs in your genital area.  There are many risk factors for a UTI. These include having a small, thin tube to drain pee and not being able to control when you pee or poop.  Treatment includes antibiotic medicines for germs.  Drink enough fluid to keep your pee pale yellow. This information is not intended to replace advice given to you by your health care provider. Make sure you discuss any questions you have with your health care provider. Document Revised:  04/11/2018 Document Reviewed: 11/01/2017 Elsevier Patient Education  2020 [employer].

## 2019-06-03 NOTE — Progress Notes (Signed)
Provider: Teairra Millar FNP-C  [name], NP  Patient Care Team: [name], NP as PCP - General (Geriatric Medicine) [name], [name], MD (Inactive) as Consulting Physician (Cardiology) [name], MD as Consulting Physician (Ophthalmology) [name], MD as Consulting Physician (General Surgery) [name], MD (Inactive) as Referring Physician (Obstetrics and Gynecology) [name] [name], MD as Consulting Physician (Ophthalmology) [facility], [name], MD as Consulting Physician (Endocrinology) [name], MD as Consulting Physician (Hematology)  Extended Emergency Contact Information Primary Emergency Contact: Gore,Pamela Address: [address]          [place], [place] [address] Johnnette Litter of [place] Phone: [phone] Mobile Phone: [phone] Relation: Daughter Secondary Emergency Contact: Lanny Cramp, [place] [address] Johnnette Litter of [place] Phone: [phone] Mobile Phone: [phone] Relation: Daughter  Code Status: Full Code  Goals of care: Advanced Directive information Advanced Directives 01/24/2019  Does Patient Have a Medical Advance Directive? No;Yes  Type of Advance Directive Living will  Does patient want to make changes to medical advance directive? -  Copy of [place] in Chart? -  Would patient like information on creating a medical advance directive? -  Pre-existing out of facility DNR order (yellow form or pink MOST form) -     Chief Complaint  Patient presents with  . Acute Visit    UTI, itching and burning x2-3 weeks    HPI:  Pt is a 85 y.o. female seen today for an acute visit for evaluation of UTI, itching and burning x 2-3 weeks.she is here with her daughter.she reports itching,burning sensation,increased urine frequency,urgency and lower abdominal pain.she denies any nausea,vomiting or lower back pain. Drinks plenty of water states appetite is  good.   Past Medical History:  Diagnosis Date  . Allergy    takes Mucinex daily as needed  . Anemia, unspecified   . Anxiety    takes Clonazepam daily as needed  . Arthritis   . Cancer (HCC)    breast  . CHF (congestive heart failure) ([place])    takes Furosemide daily  . CKD (chronic kidney disease)   . Coronary artery disease    a. s/p IMI 2004 tx with BMS to RCA;  b. s/p Promus DES to RCA 2/12 c. 06/2014 High risk stress test follow up cath 06/2014 showd patent stent--< med therapy for other mild to moedrate disease   . Coronary atherosclerosis of native coronary artery   . Depression    takes Cymbalta daily  . Diabetes mellitus    insulin daily  . Dysuria   . GERD (gastroesophageal reflux disease)    takes Protonix daily  . Gout, unspecified    takes Colchicine daily  . Headache    occasionally  . History of blood clots about 27yrs ago   in legs-takes Coumadin   . Hyperlipidemia    takes Pravastatin daily  . Hypertension    takes Imdur and Metoprolol daily  . Insomnia   . Joint pain   . Joint swelling   . Myocardial infarction ([place]) 2004  . Numbness   . Obstructive sleep apnea    does not wear cpap  . Osteoarthritis   . Osteoarthritis   . Osteoarthrosis, unspecified whether generalized or localized, lower leg   . Pain, chronic   . Polymyalgia rheumatica ([place])   . Pulmonary emboli ([place]) 9/13   felt to need lifelong anticoagulation  . Shortness of breath dyspnea  with exertion and has Albuterol inhaler prn  . Type II or unspecified type diabetes mellitus without mention of complication, uncontrolled   . Urinary incontinence    takes Linzess daily  . Vertigo    hx of;was taking Meclizine if needed  . Wears dentures    Past Surgical History:  Procedure Laterality Date  . ABDOMINAL HYSTERECTOMY     partial  . APPENDECTOMY    . blood clots/legs and lungs  2013  . BREAST BIOPSY Left 07/22/2014  . BREAST BIOPSY Left 02/10/2013  . BREAST LUMPECTOMY Left  11/05/2014  . BREAST LUMPECTOMY WITH RADIOACTIVE SEED LOCALIZATION Left 11/05/2014   Procedure: LEFT BREAST LUMPECTOMY WITH RADIOACTIVE SEED LOCALIZATION;  Surgeon: [name], MD;  Location: [facility];  Service: General;  Laterality: Left;  . CARDIAC CATHETERIZATION    . COLONOSCOPY    . CORONARY ANGIOPLASTY  2  . ESOPHAGOGASTRODUODENOSCOPY (EGD) WITH PROPOFOL N/A 11/07/2016   Procedure: ESOPHAGOGASTRODUODENOSCOPY (EGD) WITH PROPOFOL;  Surgeon: Gatha Mayer, MD;  Location: [facility];  Service: Endoscopy;  Laterality: N/A;  . EXCISION OF SKIN TAG Right 11/05/2014   Procedure: EXCISION OF RIGHT EYELID SKIN TAG;  Surgeon: [name], MD;  Location: [facility];  Service: General;  Laterality: Right;  . EYE SURGERY Bilateral    cataract   . GASTRIC BYPASS  1977    reversed in 1979, [place] N/A 06/29/2014   Procedure: LEFT HEART CATHETERIZATION WITH CORONARY ANGIOGRAM;  Surgeon: Troy Sine, MD;  Location: [facility];  Service: Cardiovascular;  Laterality: N/A;  . MEMBRANE PEEL Right 10/23/2018   Procedure: MEMBRANE PEEL;  Surgeon: Hurman Horn, MD;  Location: [facility];  Service: Ophthalmology;  Laterality: Right;  . MI with stent placement  2004  . PARS PLANA VITRECTOMY Right 10/23/2018   Procedure: PARS PLANA VITRECTOMY WITH 25 GAUGE;  Surgeon: Hurman Horn, MD;  Location: [facility];  Service: Ophthalmology;  Laterality: Right;    Allergies  Allergen Reactions  . Sulfonamide Derivatives Swelling    Mouth swelling  . Aricept [Donepezil Hcl] Diarrhea and Nausea And Vomiting    GI upset/loose stools  . Tramadol Nausea And Vomiting    Outpatient Encounter Medications as of 06/03/2019  Medication Sig  . albuterol (PROAIR HFA) 108 (90 Base) MCG/ACT inhaler Inhale 1-2 puffs into the lungs every 6 (six) hours as needed for wheezing or shortness of breath.  . anastrozole (ARIMIDEX) 1 MG tablet TAKE 1 TABLET BY MOUTH EVERY DAY  .  budesonide-formoterol (SYMBICORT) 160-4.5 MCG/ACT inhaler Inhale 2 puffs into the lungs 2 (two) times daily. For bronchitis  . Continuous Blood Gluc Sensor (FREESTYLE LIBRE 14 DAY SENSOR) MISC 1 Units by Does not apply route 4 (four) times daily -  before meals and at bedtime. For diabetes  . DULoxetine (CYMBALTA) 60 MG capsule TAKE 1 CAPSULE BY MOUTH EVERY DAY FOR ANXIETY  . febuxostat (ULORIC) 40 MG tablet TAKE 1 TABLET BY MOUTH EVERY DAY  . fluticasone (FLONASE) 50 MCG/ACT nasal spray Place 2 sprays into both nostrils daily.  . furosemide (LASIX) 20 MG tablet TAKE 1 TABLET BY MOUTH EVERY DAY AS NEEDED  . hydrALAZINE (APRESOLINE) 50 MG tablet TAKE 1 TABLET BY MOUTH 2 TIMES DAILY  . insulin aspart (NOVOLOG FLEXPEN) 100 UNIT/ML FlexPen Inject 16 Units into the skin 3 (three) times daily with meals. Max daily dose 70 units  . Insulin Glargine, 2 Unit Dial, (TOUJEO MAX SOLOSTAR) 300 UNIT/ML  SOPN Inject 44 Units into the skin daily. Patient assistance program provides  . isosorbide mononitrate (IMDUR) 60 MG 24 hr tablet TAKE 1 TABLET BY MOUTH EVERY DAY  . loratadine (CLARITIN) 10 MG tablet Take 1 tablet (10 mg total) by mouth daily.  . meclizine (ANTIVERT) 25 MG tablet TAKE 1 TABLET BY MOUTH THREE TIMES DAILY AS NEEDED FOR DIZZINESS  . memantine (NAMENDA) 5 MG tablet TAKE 2 TABLETS BY MOUTH EVERY MORNING and TAKE 1 TABLET BY MOUTH EVERY EVENING FOR MEMORY  . metoprolol succinate (TOPROL-XL) 25 MG 24 hr tablet TAKE 1 TABLET BY MOUTH EVERY DAY FOR BLOOD PRESSURE  . nitroGLYCERIN (NITROSTAT) 0.4 MG SL tablet Take one tablet under the tongue every 5 minutes as needed for chest pain  . pantoprazole (PROTONIX) 40 MG tablet TAKE 1 TABLET BY MOUTH EVERY DAY  . pravastatin (PRAVACHOL) 20 MG tablet TAKE 1 TABLET BY MOUTH EVERY DAY FOR cholesterol  . sodium polystyrene (KAYEXALATE) 15 GM/60ML suspension 15 gm by mouth weekly to keep potassium down.  . topiramate (TOPAMAX) 25 MG tablet TAKE 1 TABLET BY MOUTH  DAILY  . XARELTO 10 MG TABS tablet TAKE 1 TABLET BY MOUTH EVERY DAY  . [DISCONTINUED] Continuous Blood Gluc Receiver (FREESTYLE LIBRE 14 DAY READER) DEVI 1 Device by Does not apply route 4 (four) times daily -  before meals and at bedtime. For diabetes   No facility-administered encounter medications on file as of 06/03/2019.    Review of Systems  Constitutional: Negative for appetite change, chills, fatigue and fever.  Respiratory: Negative for cough, chest tightness, shortness of breath and wheezing.   Cardiovascular: Negative for chest pain, palpitations and leg swelling.  Gastrointestinal: Negative for abdominal distention, constipation, diarrhea, nausea and vomiting.       Lower abdominal pain   Genitourinary: Positive for frequency and urgency. Negative for decreased urine volume, difficulty urinating, dysuria, flank pain and hematuria.       Itching and burning with urination.   Musculoskeletal: Positive for gait problem.  Skin: Negative for color change, pallor and rash.  Neurological: Negative for dizziness, light-headedness and headaches.  Psychiatric/Behavioral: Negative for agitation and sleep disturbance. The patient is not nervous/anxious.     Immunization History  Administered Date(s) Administered  . Fluad Quad(high Dose 65+) 02/12/2019  . Influenza Whole 03/14/2007, 02/13/2008, 01/18/2010  . Influenza, High Dose Seasonal PF 01/16/2017, 03/11/2018  . Influenza,inj,Quad PF,6+ Mos 01/08/2013, 02/18/2014, 01/18/2015, 01/24/2016  . Influenza-Unspecified 02/15/2012, 02/18/2014  . Pneumococcal Conjugate-13 02/27/2017  . Pneumococcal Polysaccharide-23 05/08/2002  . Td 05/08/2008   Pertinent  Health Maintenance Due  Topic Date Due  . OPHTHALMOLOGY EXAM  04/25/2017  . FOOT EXAM  06/04/2019  . HEMOGLOBIN A1C  09/21/2019  . MAMMOGRAM  12/12/2019  . INFLUENZA VACCINE  Completed  . DEXA SCAN  Completed  . PNA vac Low Risk Adult  Completed   Fall Risk  06/03/2019 05/26/2019  04/14/2019 03/31/2019 02/12/2019  Falls in the past year? 0 1 0 0 0  Number falls in past yr: 0 1 0 0 0  Comment - - - - -  Injury with Fall? 0 0 0 0 0  Comment - - - - -  Risk Factor Category  - - - - -  Risk for fall due to : - History of fall(s) - - -  Follow up - - - - -    Vitals:   06/03/19 1103  BP: 140/70  Pulse: 75  Temp: 97.9 F (36.6 C)  TempSrc: Oral  SpO2: 98%  Weight: (!) 325 lb (147.4 kg)  Height: 5\' 7"  (1.702 m)   Body mass index is 50.9 kg/m. Physical Exam Vitals reviewed.  Constitutional:      General: She is not in acute distress.    Appearance: She is obese. She is not ill-appearing.  HENT:     Head: Normocephalic.  Cardiovascular:     Rate and Rhythm: Normal rate and regular rhythm.     Pulses: Normal pulses.     Heart sounds: Normal heart sounds. No murmur. No friction rub. No gallop.   Pulmonary:     Effort: Pulmonary effort is normal. No respiratory distress.     Breath sounds: Normal breath sounds. No wheezing or rales.  Chest:     Chest wall: No tenderness.  Abdominal:     General: Bowel sounds are normal.     Palpations: Abdomen is soft. There is no mass.     Tenderness: There is no abdominal tenderness. There is no right CVA tenderness, left CVA tenderness, guarding or rebound.  Musculoskeletal:        General: No swelling or tenderness.     Right lower leg: No edema.     Left lower leg: No edema.     Comments: On wheelchair during visit   Skin:    General: Skin is warm and dry.     Coloration: Skin is not pale.     Findings: No bruising or erythema.  Neurological:     Mental Status: She is alert. Mental status is at baseline.     Motor: No weakness.     Gait: Gait abnormal.  Psychiatric:        Mood and Affect: Mood normal.        Behavior: Behavior normal.        Thought Content: Thought content normal.        Judgment: Judgment normal.    Labs reviewed: Recent Labs    01/29/19 0310 01/30/19 0240 02/12/19 1129  NA 134*  134* 140  K 4.8 4.9 4.7  CL 100 102 106  CO2 23 24 28   GLUCOSE 256* 246* 218*  BUN 49* 52* 31*  CREATININE 1.39* 1.31* 1.52*  CALCIUM 9.0 8.9 8.9   Recent Labs    01/28/19 0220 01/28/19 0220 01/29/19 0310 01/30/19 0240 02/12/19 1129  AST 19   < > 18 17 18   ALT 14   < > 14 16 16   ALKPHOS 57  --  63 61  --   BILITOT 0.5   < > 0.2* 0.3 0.5  PROT 7.4   < > 7.6 7.2 6.6  ALBUMIN 2.8*  --  3.0* 2.8*  --    < > = values in this interval not displayed.   Recent Labs    01/29/19 0310 01/30/19 0240 02/12/19 1129  WBC 8.9 11.1* 7.2  NEUTROABS 6.7 8.1* 4,176  HGB 9.6* 8.9* 9.1*  HCT 30.1* 27.5* 28.0*  MCV 92.3 91.1 90.0  PLT 335 315 200   Lab Results  Component Value Date   TSH 0.884 10/09/2017   Lab Results  Component Value Date   HGBA1C 9.8 (A) 03/24/2019   Lab Results  Component Value Date   CHOL 90 07/17/2018   HDL 35 (L) 07/17/2018   LDLCALC 40 07/17/2018   TRIG 74 07/17/2018   CHOLHDL 2.6 07/17/2018    Significant Diagnostic Results in last 30 days:  No results found.  Assessment/Plan   Urinary  tract infection symptoms Afebrile.Reports itching,burning sensation,increased urine frequency,urgency and lower abdominal pain. - POC Urinalysis Dipstick done today showed cloudy urine with moderate amounts of Leukocytes.Negative for nitrites.will send uirne for culture.patient notified culture takes 3 days then will call her with the results. - Urine Culture - Advised to increase water intake. - Take cranberry 475 mg capsule one by mouth twice daily.  - Cranberry 475 MG CAPS; Take 1 capsule (475 mg total) by mouth 2 (two) times daily.  Dispense: 60 capsule; Refill: 3 -Notify provider if running fever,chills or symptoms worsen.   Family/ staff Communication: Reviewed plan of care with patient and daughter.   Labs/tests ordered:  - POC Urinalysis Dipstick - Urine Culture  Next Appointment: as needed if symptoms worsen or not resolved   Sandrea Hughs, NP

## 2019-06-06 ENCOUNTER — Telehealth: Payer: Self-pay | Admitting: *Deleted

## 2019-06-06 DIAGNOSIS — R399 Unspecified symptoms and signs involving the genitourinary system: Secondary | ICD-10-CM

## 2019-06-06 LAB — URINE CULTURE
MICRO NUMBER:: 10084590
SPECIMEN QUALITY:: ADEQUATE

## 2019-06-06 MED ORDER — SACCHAROMYCES BOULARDII 250 MG PO CAPS: 250.0000 mg | ORAL_CAPSULE | Freq: Two times a day (BID) | ORAL | 0 refills | Status: AC

## 2019-06-06 MED ORDER — AMOXICILLIN-POT CLAVULANATE 500-125 MG PO TABS: 1.0000 | ORAL_TABLET | Freq: Two times a day (BID) | ORAL | 0 refills | Status: AC

## 2019-06-06 NOTE — Telephone Encounter (Signed)
rx sent to pharmacy by e-script  

## 2019-06-09 ENCOUNTER — Other Ambulatory Visit: Payer: Self-pay | Admitting: Family

## 2019-06-09 DIAGNOSIS — J3489 Other specified disorders of nose and nasal sinuses: Secondary | ICD-10-CM

## 2019-06-11 ENCOUNTER — Other Ambulatory Visit: Payer: Self-pay | Admitting: Internal Medicine

## 2019-06-11 ENCOUNTER — Ambulatory Visit: Payer: PPO | Admitting: Nurse Practitioner

## 2019-06-11 MED ORDER — TOUJEO MAX SOLOSTAR 300 UNIT/ML ~~LOC~~ SOPN: 44.0000 [IU] | PEN_INJECTOR | Freq: Every day | SUBCUTANEOUS | 11 refills | Status: DC

## 2019-06-23 ENCOUNTER — Other Ambulatory Visit: Payer: Self-pay

## 2019-06-23 DIAGNOSIS — M25552 Pain in left hip: Secondary | ICD-10-CM | POA: Diagnosis not present

## 2019-06-24 DIAGNOSIS — N2581 Secondary hyperparathyroidism of renal origin: Secondary | ICD-10-CM | POA: Diagnosis not present

## 2019-06-24 DIAGNOSIS — D631 Anemia in chronic kidney disease: Secondary | ICD-10-CM | POA: Diagnosis not present

## 2019-06-24 DIAGNOSIS — N183 Chronic kidney disease, stage 3 unspecified: Secondary | ICD-10-CM | POA: Diagnosis not present

## 2019-06-24 DIAGNOSIS — I5030 Unspecified diastolic (congestive) heart failure: Secondary | ICD-10-CM | POA: Diagnosis not present

## 2019-06-24 DIAGNOSIS — N390 Urinary tract infection, site not specified: Secondary | ICD-10-CM | POA: Diagnosis not present

## 2019-06-24 DIAGNOSIS — E875 Hyperkalemia: Secondary | ICD-10-CM | POA: Diagnosis not present

## 2019-06-24 DIAGNOSIS — E1122 Type 2 diabetes mellitus with diabetic chronic kidney disease: Secondary | ICD-10-CM | POA: Diagnosis not present

## 2019-06-24 DIAGNOSIS — I129 Hypertensive chronic kidney disease with stage 1 through stage 4 chronic kidney disease, or unspecified chronic kidney disease: Secondary | ICD-10-CM | POA: Diagnosis not present

## 2019-06-25 ENCOUNTER — Ambulatory Visit (INDEPENDENT_AMBULATORY_CARE_PROVIDER_SITE_OTHER): Payer: PPO | Admitting: Internal Medicine

## 2019-06-25 ENCOUNTER — Other Ambulatory Visit: Payer: Self-pay

## 2019-06-25 ENCOUNTER — Telehealth: Payer: Self-pay | Admitting: Hematology

## 2019-06-25 DIAGNOSIS — E1121 Type 2 diabetes mellitus with diabetic nephropathy: Secondary | ICD-10-CM

## 2019-06-25 DIAGNOSIS — E1140 Type 2 diabetes mellitus with diabetic neuropathy, unspecified: Secondary | ICD-10-CM

## 2019-06-25 DIAGNOSIS — N1832 Chronic kidney disease, stage 3b: Secondary | ICD-10-CM | POA: Diagnosis not present

## 2019-06-25 DIAGNOSIS — E1165 Type 2 diabetes mellitus with hyperglycemia: Secondary | ICD-10-CM | POA: Diagnosis not present

## 2019-06-25 DIAGNOSIS — Z794 Long term (current) use of insulin: Secondary | ICD-10-CM | POA: Diagnosis not present

## 2019-06-25 DIAGNOSIS — N183 Chronic kidney disease, stage 3 unspecified: Secondary | ICD-10-CM | POA: Diagnosis not present

## 2019-06-25 DIAGNOSIS — E1122 Type 2 diabetes mellitus with diabetic chronic kidney disease: Secondary | ICD-10-CM

## 2019-06-25 NOTE — Progress Notes (Signed)
Virtual Visit via Video Note  I connected with [name] on 06/25/19 at 1:00 pm by a video enabled telemedicine application and verified that I am speaking with the correct person using two identifiers.   I discussed the limitations of evaluation and management by telemedicine and the availability of in person appointments. The patient expressed understanding and agreed to proceed.   -Location of the patient : home -Location of the provider : Office -The names of all persons participating in the telemedicine service : Pt, daughter [name] and myself      Name: [name]  Age/ Sex: 85 y.o., female   MRN/ DOB: [id number], November 05, 1934     PCP: [name], NP   Reason for Endocrinology Evaluation: Type 2 Diabetes Mellitus     Initial Endocrinology Clinic Visit: 08/19/2018    PATIENT IDENTIFIER: [name] is a 85 y.o. female with a past medical history of T2DM, CKD III, and chronic pain syndrome . The patient has followed with Endocrinology clinic since 08/19/2018 for consultative assistance with management of her diabetes.  DIABETIC HISTORY:  Ms. Cheese was diagnosed with T2DM > 40 yrs ago. Pt is a poor historian and unable to recall oral glycemic agents. She has been on insulin therapy for years. Her hemoglobin A1c has ranged from 6.7%  in 07/2017, peaking at 12.1% in 2018.  SUBJECTIVE:   During the last visit (03/24/2019): Her A1c was 9.8 %. We increased  Toujeo and novolog    Today (06/25/2019): [name] is here for a 3 month follow up visit on diabetes management. She is accompanied by her daughter  Ledon Snare states the sister is the care giver who has been checking mother's glucose and she is also the one in charge of administering her glucose. Pt has the  freestyle libre. Her sister has been the new caretaker since 10/2018. The patient has not had hypoglycemic episodes since the last clinic visit.    The patient is complaining of bilateral hand pains, she has  not been able to obtain her oxycodone prescription.   HOME DIABETES REGIMEN:   Toujeo 44 units daily   Novolog 16 units TID   CF: BG -150/30 with meals     GLUCOSE LOG:       HISTORY:  Past Medical History:  Past Medical History:  Diagnosis Date  . Allergy    takes Mucinex daily as needed  . Anemia, unspecified   . Anxiety    takes Clonazepam daily as needed  . Arthritis   . Cancer (HCC)    breast  . CHF (congestive heart failure) ([place])    takes Furosemide daily  . CKD (chronic kidney disease)   . Coronary artery disease    a. s/p IMI 2004 tx with BMS to RCA;  b. s/p Promus DES to RCA 2/12 c. 06/2014 High risk stress test follow up cath 06/2014 showd patent stent--< med therapy for other mild to moedrate disease   . Coronary atherosclerosis of native coronary artery   . Depression    takes Cymbalta daily  . Diabetes mellitus    insulin daily  . Dysuria   . GERD (gastroesophageal reflux disease)    takes Protonix daily  . Gout, unspecified    takes Colchicine daily  . Headache    occasionally  . History of blood clots about 97yrs ago   in legs-takes Coumadin   . Hyperlipidemia    takes Pravastatin daily  . Hypertension  takes Imdur and Metoprolol daily  . Insomnia   . Joint pain   . Joint swelling   . Myocardial infarction ([place]) 2004  . Numbness   . Obstructive sleep apnea    does not wear cpap  . Osteoarthritis   . Osteoarthritis   . Osteoarthrosis, unspecified whether generalized or localized, lower leg   . Pain, chronic   . Polymyalgia rheumatica ([place])   . Pulmonary emboli ([place]) 9/13   felt to need lifelong anticoagulation  . Shortness of breath dyspnea    with exertion and has Albuterol inhaler prn  . Type II or unspecified type diabetes mellitus without mention of complication, uncontrolled   . Urinary incontinence    takes Linzess daily  . Vertigo    hx of;was taking Meclizine if needed  . Wears dentures    Past Surgical History:  Past  Surgical History:  Procedure Laterality Date  . ABDOMINAL HYSTERECTOMY     partial  . APPENDECTOMY    . blood clots/legs and lungs  2013  . BREAST BIOPSY Left 07/22/2014  . BREAST BIOPSY Left 02/10/2013  . BREAST LUMPECTOMY Left 11/05/2014  . BREAST LUMPECTOMY WITH RADIOACTIVE SEED LOCALIZATION Left 11/05/2014   Procedure: LEFT BREAST LUMPECTOMY WITH RADIOACTIVE SEED LOCALIZATION;  Surgeon: [name], MD;  Location: [facility];  Service: General;  Laterality: Left;  . CARDIAC CATHETERIZATION    . COLONOSCOPY    . CORONARY ANGIOPLASTY  2  . ESOPHAGOGASTRODUODENOSCOPY (EGD) WITH PROPOFOL N/A 11/07/2016   Procedure: ESOPHAGOGASTRODUODENOSCOPY (EGD) WITH PROPOFOL;  Surgeon: Gatha Mayer, MD;  Location: [facility];  Service: Endoscopy;  Laterality: N/A;  . EXCISION OF SKIN TAG Right 11/05/2014   Procedure: EXCISION OF RIGHT EYELID SKIN TAG;  Surgeon: [name], MD;  Location: [facility];  Service: General;  Laterality: Right;  . EYE SURGERY Bilateral    cataract   . GASTRIC BYPASS  1977    reversed in 1979, [place] N/A 06/29/2014   Procedure: LEFT HEART CATHETERIZATION WITH CORONARY ANGIOGRAM;  Surgeon: Troy Sine, MD;  Location: [facility] C[facility];  Service: Cardiovascular;  Laterality: N/A;  . MEMBRANE PEEL Right 10/23/2018   Procedure: MEMBRANE PEEL;  Surgeon: Hurman Horn, MD;  Location: [facility];  Service: Ophthalmology;  Laterality: Right;  . MI with stent placement  2004  . PARS PLANA VITRECTOMY Right 10/23/2018   Procedure: PARS PLANA VITRECTOMY WITH 25 GAUGE;  Surgeon: Hurman Horn, MD;  Location: [facility];  Service: Ophthalmology;  Laterality: Right;    Social History:  reports that she has never smoked. She has never used smokeless tobacco. She reports that she does not drink alcohol or use drugs. Family History:  Family History  Problem Relation Age of Onset  . Breast cancer Mother 42  . Heart disease Mother   .  Throat cancer Father   . Hypertension Father   . Arthritis Father   . Diabetes Father   . Arthritis Sister   . Obesity Sister   . Diabetes Sister   . Heart disease Cousin   . Colon cancer Neg Hx   . Stomach cancer Neg Hx   . Esophageal cancer Neg Hx      HOME MEDICATIONS: Allergies as of 06/25/2019      Reactions   Sulfonamide Derivatives Swelling   Mouth swelling   Aricept [donepezil Hcl] Diarrhea, Nausea And Vomiting   GI upset/loose stools   Tramadol Nausea And Vomiting  Medication List       Accurate as of June 25, 2019 12:55 PM. If you have any questions, ask your nurse or doctor.        albuterol 108 (90 Base) MCG/ACT inhaler Commonly known as: ProAir HFA Inhale 1-2 puffs into the lungs every 6 (six) hours as needed for wheezing or shortness of breath.   Allergy Relief 10 MG tablet Generic drug: loratadine TAKE 1 TABLET BY MOUTH EVERY DAY   anastrozole 1 MG tablet Commonly known as: ARIMIDEX TAKE 1 TABLET BY MOUTH EVERY DAY   budesonide-formoterol 160-4.5 MCG/ACT inhaler Commonly known as: SYMBICORT Inhale 2 puffs into the lungs 2 (two) times daily. For bronchitis   Cranberry 475 MG Caps Take 1 capsule (475 mg total) by mouth 2 (two) times daily.   DULoxetine 60 MG capsule Commonly known as: CYMBALTA TAKE 1 CAPSULE BY MOUTH EVERY DAY FOR ANXIETY   febuxostat 40 MG tablet Commonly known as: ULORIC TAKE 1 TABLET BY MOUTH EVERY DAY   fluticasone 50 MCG/ACT nasal spray Commonly known as: FLONASE Place 2 sprays into both nostrils daily.   FreeStyle Libre 14 Day Sensor Misc 1 Units by Does not apply route 4 (four) times daily -  before meals and at bedtime. For diabetes   furosemide 20 MG tablet Commonly known as: LASIX TAKE 1 TABLET BY MOUTH EVERY DAY AS NEEDED   hydrALAZINE 50 MG tablet Commonly known as: APRESOLINE TAKE 1 TABLET BY MOUTH 2 TIMES DAILY   isosorbide mononitrate 60 MG 24 hr tablet Commonly known as: IMDUR TAKE 1 TABLET  BY MOUTH EVERY DAY   meclizine 25 MG tablet Commonly known as: ANTIVERT TAKE 1 TABLET BY MOUTH THREE TIMES DAILY AS NEEDED FOR DIZZINESS   memantine 5 MG tablet Commonly known as: NAMENDA TAKE 2 TABLETS BY MOUTH EVERY MORNING and TAKE 1 TABLET BY MOUTH EVERY EVENING FOR MEMORY   metoprolol succinate 25 MG 24 hr tablet Commonly known as: TOPROL-XL TAKE 1 TABLET BY MOUTH EVERY DAY FOR BLOOD PRESSURE   nitroGLYCERIN 0.4 MG SL tablet Commonly known as: Nitrostat Take one tablet under the tongue every 5 minutes as needed for chest pain   NovoLOG FlexPen 100 UNIT/ML FlexPen Generic drug: insulin aspart Inject 16 Units into the skin 3 (three) times daily with meals. Max daily dose 70 units   pantoprazole 40 MG tablet Commonly known as: PROTONIX TAKE 1 TABLET BY MOUTH EVERY DAY   pravastatin 20 MG tablet Commonly known as: PRAVACHOL TAKE 1 TABLET BY MOUTH EVERY DAY FOR cholesterol   sodium polystyrene 15 GM/60ML suspension Commonly known as: KAYEXALATE 15 gm by mouth weekly to keep potassium down.   topiramate 25 MG tablet Commonly known as: TOPAMAX TAKE 1 TABLET BY MOUTH DAILY   Toujeo Max SoloStar 300 UNIT/ML Sopn Generic drug: Insulin Glargine (2 Unit Dial) Inject 44 Units into the skin daily. Patient assistance program provides   Xarelto 10 MG Tabs tablet Generic drug: rivaroxaban TAKE 1 TABLET BY MOUTH EVERY DAY        DATA REVIEWED:  Lab Results  Component Value Date   HGBA1C 9.8 (A) 03/24/2019   HGBA1C 11.7 (A) 12/20/2018   HGBA1C 8.3 (H) 07/17/2018   Lab Results  Component Value Date   MICROALBUR 2.9 06/14/2017   LDLCALC 40 07/17/2018   CREATININE 1.52 (H) 02/12/2019   Lab Results  Component Value Date   MICRALBCREAT 81 (H) 06/14/2017     Lab Results  Component Value Date  CHOL 90 07/17/2018   HDL 35 (L) 07/17/2018   LDLCALC 40 07/17/2018   TRIG 74 07/17/2018   CHOLHDL 2.6 07/17/2018         ASSESSMENT / PLAN / RECOMMENDATIONS:    1)Type 2 Diabetes Mellitus, Poorly Controlled, With CKD III and macrovascular complications - Most recent A1c of 9.8 %. Goal A1c < 7.5 %.     -We have not been able to download the CGM.  But we have the glucose log as above, and review of the glucose log her readings have been improving dramatically.  She does have some variable BG readings but this is due to insulin-carbohydrate mismatch.  - Per daughter [name], the patient has made dietary changes which has also helped to lose weight. -I have encouraged her to continue with lifestyle changes -No changes will be made to her regimen today   MEDICATIONS:  Continue Toujeo 44  units daily   Continue Novolog 16 units TID QAC  CF : BG- 150/30  EDUCATION / INSTRUCTIONS:  BG monitoring instructions: Patient is instructed to check her blood sugars 4 times a day, before meals and bedtime.  Call [facility] if: BG persistently < 70 or > 300. . I reviewed the Rule of 15 for the treatment of hypoglycemia in detail with the patient. Literature supplied.   2.  Hand pain:  -I have assured the patient that her PCP had put in a referral for pain management, in the meantime she was advised to use Tylenol but to avoid NSAIDs due to CKD.    F/U in 4 months    Signed electronically by: Mack Guise, MD  [facility]  [facility] [address] [place], [place] [address] Phone: [phone] FAX: [phone]   CC: [name], NP [place] [address] Phone: [phone]  Fax: [phone]  Return to Endocrinology clinic as below: Future Appointments  Date Time Provider [place]  06/25/2019  1:00 PM Jlyn Bracamonte, [name], MD [facility] None  06/26/2019 11:30 AM [facility] 1 [facility] None  06/26/2019 12:00 PM [name], MD [facility] None  07/29/2019  1:45 PM Burtis Junes, NP [facility]  09/12/2019   1:00 PM [name], NP [facility] None  04/16/2020 11:00 AM [name], NP [facility] None

## 2019-06-25 NOTE — Telephone Encounter (Signed)
Returned patient's phone call regarding rescheduling 02/18 appointment, per patient's request appointment has moved to March.

## 2019-06-26 ENCOUNTER — Other Ambulatory Visit: Payer: PPO

## 2019-06-26 ENCOUNTER — Ambulatory Visit: Payer: PPO | Admitting: Hematology

## 2019-06-27 DIAGNOSIS — N390 Urinary tract infection, site not specified: Secondary | ICD-10-CM | POA: Diagnosis not present

## 2019-07-03 ENCOUNTER — Telehealth: Payer: Self-pay

## 2019-07-03 NOTE — Telephone Encounter (Signed)
Spoke with Virgilio Belling at [facility] & she assured me that [name] is approved to be treated at their facility. They are waiting to hear & receive info from [place] that was faxed asking for any updated diagnostic info(xray,MRI, CT or therapy).  I(Lisa) explained that we haven't seen that form/info come across, but I have faxed it over today 07/03/19 to [phone].  [facility] will call the pt to schedule. Thanks, Lattie Haw

## 2019-07-03 NOTE — Telephone Encounter (Signed)
[name], patients daughter called stating she just got off the phone with the pain clinic to check the status of referral and they are waiting for [place] to provide more information before they will schedule appointment.  I checked referral details and it appears that everything is complete on our end. I informed [name] that I will pass this message to the referral coordinator to further follow-up.  [name] expressed her appreciation for doing so and states her mother is in a lot of pain and needs treatment.  Lattie Haw please further address

## 2019-07-04 ENCOUNTER — Other Ambulatory Visit: Payer: Self-pay | Admitting: Nurse Practitioner

## 2019-07-04 ENCOUNTER — Telehealth: Payer: Self-pay | Admitting: *Deleted

## 2019-07-04 DIAGNOSIS — F331 Major depressive disorder, recurrent, moderate: Secondary | ICD-10-CM | POA: Diagnosis not present

## 2019-07-04 DIAGNOSIS — G309 Alzheimer's disease, unspecified: Secondary | ICD-10-CM | POA: Diagnosis not present

## 2019-07-04 DIAGNOSIS — F0280 Dementia in other diseases classified elsewhere without behavioral disturbance: Secondary | ICD-10-CM | POA: Diagnosis not present

## 2019-07-04 DIAGNOSIS — T424X5D Adverse effect of benzodiazepines, subsequent encounter: Secondary | ICD-10-CM | POA: Diagnosis not present

## 2019-07-04 NOTE — Telephone Encounter (Signed)
Home health calling stating that pt is reporting some depression and asking if her Cymbalta can be increased? Please advise

## 2019-07-04 NOTE — Telephone Encounter (Signed)
She is at max dose. Would recommend psych evaluation at this time.

## 2019-07-06 ENCOUNTER — Ambulatory Visit: Payer: PPO | Attending: Internal Medicine

## 2019-07-06 DIAGNOSIS — Z23 Encounter for immunization: Secondary | ICD-10-CM

## 2019-07-06 NOTE — Progress Notes (Signed)
   Covid-19 Vaccination Clinic  Name:  [name]    MRN: [id number] DOB: 18-May-1934  07/06/2019  [name] was observed post Covid-19 immunization for 15 minutes without incidence. She was provided with Vaccine Information Sheet and instruction to access the V-Safe system.   [name] was instructed to call 911 with any severe reactions post vaccine: [name] Difficulty breathing  . Swelling of your face and throat  . A fast heartbeat  . A bad rash all over your body  . Dizziness and weakness    Immunizations Administered    Name Date Dose VIS Date Route   Pfizer COVID-19 Vaccine 07/06/2019  1:58 PM 0.3 mL 04/18/2019 Intramuscular   Manufacturer: [place]   Lot: [id number]   [place]: [address]-3

## 2019-07-07 NOTE — Telephone Encounter (Signed)
This is not a referral they will have to call a psychiatrist and/or counselor/therapist and make an appt.  We can mail them some recommendations but they have to call to make the appt.

## 2019-07-07 NOTE — Telephone Encounter (Signed)
Spoke with patient's daughter and she would like the psych evaluation.

## 2019-07-07 NOTE — Telephone Encounter (Signed)
Spoke with pam again and she will take the list.

## 2019-07-08 DIAGNOSIS — M792 Neuralgia and neuritis, unspecified: Secondary | ICD-10-CM | POA: Diagnosis not present

## 2019-07-08 DIAGNOSIS — L602 Onychogryphosis: Secondary | ICD-10-CM | POA: Diagnosis not present

## 2019-07-08 DIAGNOSIS — E1351 Other specified diabetes mellitus with diabetic peripheral angiopathy without gangrene: Secondary | ICD-10-CM | POA: Diagnosis not present

## 2019-07-15 ENCOUNTER — Ambulatory Visit (INDEPENDENT_AMBULATORY_CARE_PROVIDER_SITE_OTHER): Payer: PPO | Admitting: Nurse Practitioner

## 2019-07-15 ENCOUNTER — Encounter: Payer: Self-pay | Admitting: Nurse Practitioner

## 2019-07-15 ENCOUNTER — Other Ambulatory Visit: Payer: Self-pay

## 2019-07-15 DIAGNOSIS — F322 Major depressive disorder, single episode, severe without psychotic features: Secondary | ICD-10-CM | POA: Diagnosis not present

## 2019-07-15 DIAGNOSIS — G8929 Other chronic pain: Secondary | ICD-10-CM

## 2019-07-15 DIAGNOSIS — M542 Cervicalgia: Secondary | ICD-10-CM

## 2019-07-15 DIAGNOSIS — M25512 Pain in left shoulder: Secondary | ICD-10-CM

## 2019-07-15 DIAGNOSIS — E1142 Type 2 diabetes mellitus with diabetic polyneuropathy: Secondary | ICD-10-CM

## 2019-07-15 DIAGNOSIS — I2699 Other pulmonary embolism without acute cor pulmonale: Secondary | ICD-10-CM | POA: Insufficient documentation

## 2019-07-15 MED ORDER — PREGABALIN 25 MG PO CAPS: 25.0000 mg | ORAL_CAPSULE | Freq: Two times a day (BID) | ORAL | 1 refills | Status: DC

## 2019-07-15 NOTE — Patient Instructions (Signed)
To go to [facility] for xray for cervical spine for evaluation of pain to neck and hands  Start tylenol 500 mg 2 tablet every 8 hours as needed for pain, can take 2 routinely in the morning and then use 8 hours later if needed- MAX of 6 tablets in 24 hours  To start lyrica for nerve pain in legs/feet. May also help hands.

## 2019-07-15 NOTE — Progress Notes (Signed)
This service is provided via telemedicine  No vital signs collected/recorded due to the encounter was a telemedicine visit.   Location of patient (ex: home, work):  Home  Patient consents to a telephone visit:  Yes  Location of the provider (ex: office, home):  [place].  Name of any referring provider:  N/A  Names of all persons participating in the telemedicine service and their role in the encounter:  Patient, Daughter [name], RMA, [name], NP.    Time spent on call:  8 minutes on the phone with Medical Assistant.    Careteam: Patient Care Team: [name], NP as PCP - General (Geriatric Medicine) [name], [name], MD (Inactive) as Consulting Physician (Cardiology) [name], MD as Consulting Physician (Ophthalmology) [name], MD as Consulting Physician (General Surgery) [name], MD (Inactive) as Referring Physician (Obstetrics and Gynecology) [name] [name], MD as Consulting Physician (Ophthalmology) [facility], [name], MD as Consulting Physician (Endocrinology) [name], MD as Consulting Physician (Hematology)  PLACE OF SERVICE:  [facility] Advanced Directive information Does Patient Have a Medical Advance Directive?: Yes, Does patient want to make changes to medical advance directive?: No - Patient declined  Allergies  Allergen Reactions  . Sulfonamide Derivatives Swelling    Mouth swelling  . Aricept [Donepezil Hcl] Diarrhea and Nausea And Vomiting    GI upset/loose stools  . Tramadol Nausea And Vomiting    Chief Complaint  Patient presents with  . Acute Visit    Pain Medication Request      HPI: Patient is a 85 y.o. female seen via virtual visit at the request of daughter.   Depression- [facility] to [facility] and they said they were going to refer her to psychiatrist but they have not heard anything. Daughter plans to call and make the appt. She is currently on  duloxetine 60 mg daily- this does not control the depression. Reports she is depressed about her weight. Daughter reports she is getting better with her weight. Not eating as much junk.  Reports the ongoing pain is an issue. Reports pain in her feet- has diabetic neuropathy. Daughter reports she was gabapentin in the past- unsure why she was taken off.  States she has had Left shoulder pain "for some time" now. No injury.  Reports shoulder has progressively gotten worse over the last few months.  Pain is 8/10. Reports it is aching and coming down from her neck when she tries to move it.  Reports pain will go up into ear and also reports pain to side of neck.   With left hand pain. Reports hands still bothering her. Reports weakness and pain in hand.    Review of Systems:  Review of Systems  Constitutional: Negative for chills, fever, malaise/fatigue and weight loss.  Cardiovascular: Negative for palpitations.  Musculoskeletal: Positive for back pain, joint pain, myalgias and neck pain.  Neurological: Positive for tingling and sensory change. Negative for focal weakness, seizures and weakness.  Psychiatric/Behavioral: Positive for depression and memory loss. Negative for suicidal ideas. The patient is not nervous/anxious and does not have insomnia.    Past Medical History:  Diagnosis Date  . Allergy    takes Mucinex daily as needed  . Anemia, unspecified   . Anxiety    takes Clonazepam daily as needed  . Arthritis   . Cancer (HCC)    breast  . CHF (congestive heart failure) ([place])    takes Furosemide daily  .  CKD (chronic kidney disease)   . Coronary artery disease    a. s/p IMI 2004 tx with BMS to RCA;  b. s/p Promus DES to RCA 2/12 c. 06/2014 High risk stress test follow up cath 06/2014 showd patent stent--< med therapy for other mild to moedrate disease   . Coronary atherosclerosis of native coronary artery   . Depression    takes Cymbalta daily  . Diabetes mellitus    insulin  daily  . Dysuria   . GERD (gastroesophageal reflux disease)    takes Protonix daily  . Gout, unspecified    takes Colchicine daily  . Headache    occasionally  . History of blood clots about 47yrs ago   in legs-takes Coumadin   . Hyperlipidemia    takes Pravastatin daily  . Hypertension    takes Imdur and Metoprolol daily  . Insomnia   . Joint pain   . Joint swelling   . Myocardial infarction ([place]) 2004  . Numbness   . Obstructive sleep apnea    does not wear cpap  . Osteoarthritis   . Osteoarthritis   . Osteoarthrosis, unspecified whether generalized or localized, lower leg   . Pain, chronic   . Polymyalgia rheumatica ([place])   . Pulmonary emboli ([place]) 9/13   felt to need lifelong anticoagulation  . Shortness of breath dyspnea    with exertion and has Albuterol inhaler prn  . Type II or unspecified type diabetes mellitus without mention of complication, uncontrolled   . Urinary incontinence    takes Linzess daily  . Vertigo    hx of;was taking Meclizine if needed  . Wears dentures    Past Surgical History:  Procedure Laterality Date  . ABDOMINAL HYSTERECTOMY     partial  . APPENDECTOMY    . blood clots/legs and lungs  2013  . BREAST BIOPSY Left 07/22/2014  . BREAST BIOPSY Left 02/10/2013  . BREAST LUMPECTOMY Left 11/05/2014  . BREAST LUMPECTOMY WITH RADIOACTIVE SEED LOCALIZATION Left 11/05/2014   Procedure: LEFT BREAST LUMPECTOMY WITH RADIOACTIVE SEED LOCALIZATION;  Surgeon: [name], MD;  Location: [place];  Service: General;  Laterality: Left;  . CARDIAC CATHETERIZATION    . COLONOSCOPY    . CORONARY ANGIOPLASTY  2  . ESOPHAGOGASTRODUODENOSCOPY (EGD) WITH PROPOFOL N/A 11/07/2016   Procedure: ESOPHAGOGASTRODUODENOSCOPY (EGD) WITH PROPOFOL;  Surgeon: Gatha Mayer, MD;  Location: [facility];  Service: Endoscopy;  Laterality: N/A;  . EXCISION OF SKIN TAG Right 11/05/2014   Procedure: EXCISION OF RIGHT EYELID SKIN TAG;  Surgeon: [name], MD;  Location:  [place];  Service: General;  Laterality: Right;  . EYE SURGERY Bilateral    cataract   . GASTRIC BYPASS  1977    reversed in 1979, [place] N/A 06/29/2014   Procedure: LEFT HEART CATHETERIZATION WITH CORONARY ANGIOGRAM;  Surgeon: Troy Sine, MD;  Location: [facility];  Service: Cardiovascular;  Laterality: N/A;  . MEMBRANE PEEL Right 10/23/2018   Procedure: MEMBRANE PEEL;  Surgeon: Hurman Horn, MD;  Location: [place];  Service: Ophthalmology;  Laterality: Right;  . MI with stent placement  2004  . PARS PLANA VITRECTOMY Right 10/23/2018   Procedure: PARS PLANA VITRECTOMY WITH 25 GAUGE;  Surgeon: Hurman Horn, MD;  Location: [place];  Service: Ophthalmology;  Laterality: Right;   Social History:   reports that she has never smoked. She has never used smokeless tobacco. She reports that she does  not drink alcohol or use drugs.  Family History  Problem Relation Age of Onset  . Breast cancer Mother 68  . Heart disease Mother   . Throat cancer Father   . Hypertension Father   . Arthritis Father   . Diabetes Father   . Arthritis Sister   . Obesity Sister   . Diabetes Sister   . Heart disease Cousin   . Colon cancer Neg Hx   . Stomach cancer Neg Hx   . Esophageal cancer Neg Hx     Medications: Patient's Medications  New Prescriptions   No medications on file  Previous Medications   ALBUTEROL (PROAIR HFA) 108 (90 BASE) MCG/ACT INHALER    Inhale 1-2 puffs into the lungs every 6 (six) hours as needed for wheezing or shortness of breath.   ANASTROZOLE (ARIMIDEX) 1 MG TABLET    TAKE 1 TABLET BY MOUTH EVERY DAY   BUDESONIDE-FORMOTEROL (SYMBICORT) 160-4.5 MCG/ACT INHALER    Inhale 2 puffs into the lungs 2 (two) times daily. For bronchitis   CONTINUOUS BLOOD GLUC SENSOR (FREESTYLE LIBRE 14 DAY SENSOR) MISC    1 Units by Does not apply route 4 (four) times daily -  before meals and at bedtime. For diabetes   DULOXETINE (CYMBALTA) 60  MG CAPSULE    TAKE 1 CAPSULE BY MOUTH EVERY DAY FOR ANXIETY   FEBUXOSTAT (ULORIC) 40 MG TABLET    TAKE 1 TABLET BY MOUTH EVERY DAY   FLUTICASONE (FLONASE) 50 MCG/ACT NASAL SPRAY    Place 2 sprays into both nostrils daily.   FUROSEMIDE (LASIX) 20 MG TABLET    TAKE 1 TABLET BY MOUTH EVERY DAY AS NEEDED   HYDRALAZINE (APRESOLINE) 50 MG TABLET    TAKE 1 TABLET BY MOUTH 2 TIMES DAILY   INSULIN ASPART (NOVOLOG FLEXPEN) 100 UNIT/ML FLEXPEN    Inject 16 Units into the skin 3 (three) times daily with meals. Max daily dose 70 units   INSULIN GLARGINE, 2 UNIT DIAL, (TOUJEO MAX SOLOSTAR) 300 UNIT/ML SOPN    Inject 44 Units into the skin daily. Patient assistance program provides   ISOSORBIDE MONONITRATE (IMDUR) 60 MG 24 HR TABLET    TAKE 1 TABLET BY MOUTH EVERY DAY   MECLIZINE (ANTIVERT) 25 MG TABLET    TAKE 1 TABLET BY MOUTH THREE TIMES DAILY AS NEEDED FOR DIZZINESS   MEMANTINE (NAMENDA) 5 MG TABLET    TAKE 2 TABLETS BY MOUTH EVERY MORNING and TAKE 1 TABLET BY MOUTH EVERY EVENING FOR MEMORY   METOPROLOL SUCCINATE (TOPROL-XL) 25 MG 24 HR TABLET    TAKE 1 TABLET BY MOUTH EVERY DAY FOR BLOOD PRESSURE   NITROGLYCERIN (NITROSTAT) 0.4 MG SL TABLET    Take one tablet under the tongue every 5 minutes as needed for chest pain   PANTOPRAZOLE (PROTONIX) 40 MG TABLET    TAKE 1 TABLET BY MOUTH EVERY DAY   PRAVASTATIN (PRAVACHOL) 20 MG TABLET    TAKE 1 TABLET BY MOUTH EVERY DAY FOR cholesterol   SODIUM POLYSTYRENE (KAYEXALATE) 15 GM/60ML SUSPENSION    15 gm by mouth weekly to keep potassium down.   TOPIRAMATE (TOPAMAX) 25 MG TABLET    TAKE 1 TABLET BY MOUTH DAILY   XARELTO 10 MG TABS TABLET    TAKE 1 TABLET BY MOUTH EVERY DAY  Modified Medications   No medications on file  Discontinued Medications   ALLERGY RELIEF 10 MG TABLET    TAKE 1 TABLET BY MOUTH EVERY DAY    Physical  Exam:  There were no vitals filed for this visit. There is no height or weight on file to calculate BMI. Wt Readings from Last 3 Encounters:    06/03/19 (!) 325 lb (147.4 kg)  05/26/19 (!) 312 lb (141.5 kg)  03/31/19 300 lb (136.1 kg)   \  Labs reviewed: Basic Metabolic Panel: Recent Labs    01/29/19 0310 01/30/19 0240 02/12/19 1129  NA 134* 134* 140  K 4.8 4.9 4.7  CL 100 102 106  CO2 23 24 28   GLUCOSE 256* 246* 218*  BUN 49* 52* 31*  CREATININE 1.39* 1.31* 1.52*  CALCIUM 9.0 8.9 8.9   Liver Function Tests: Recent Labs    01/28/19 0220 01/28/19 0220 01/29/19 0310 01/30/19 0240 02/12/19 1129  AST 19   < > 18 17 18   ALT 14   < > 14 16 16   ALKPHOS 57  --  63 61  --   BILITOT 0.5   < > 0.2* 0.3 0.5  PROT 7.4   < > 7.6 7.2 6.6  ALBUMIN 2.8*  --  3.0* 2.8*  --    < > = values in this interval not displayed.   Recent Labs    08/27/18 1355  LIPASE 39   No results for input(s): AMMONIA in the last 8760 hours. CBC: Recent Labs    01/29/19 0310 01/30/19 0240 02/12/19 1129  WBC 8.9 11.1* 7.2  NEUTROABS 6.7 8.1* 4,176  HGB 9.6* 8.9* 9.1*  HCT 30.1* 27.5* 28.0*  MCV 92.3 91.1 90.0  PLT 335 315 200   Lipid Panel: Recent Labs    07/17/18 1141  CHOL 90  HDL 35*  LDLCALC 40  TRIG 74  CHOLHDL 2.6   TSH: No results for input(s): TSH in the last 8760 hours. A1C: Lab Results  Component Value Date   HGBA1C 9.8 (A) 03/24/2019     Assessment/Plan 1. Diabetic polyneuropathy associated with type 2 diabetes mellitus ([place]) -part of her pain compliant today, chronic compliant but did not wish to start gabapentin and wanted pain referral at last OV, today daughter explains that she had side effects with gabapentin in the past. Willing to try lyrica at this time -starting lyrcia 25 mg BID due to CKD.  - pregabalin (LYRICA) 25 MG capsule; Take 1 capsule (25 mg total) by mouth 2 (two) times daily.  Dispense: 60 capsule; Refill: 1  2. Depression, major, single episode, severe ([place]) Has been given list of psychiatrist and counselors daughter plans to call today to set up visit. Will continue on cymbalta 60  mg daily   3. Morbid (severe) obesity due to excess calories ([place]) -depressed over weight, daughter reports she has made dietary modifications at this time. Also encouraged to continue to work on increase in physical activity if she could tolerate.   4. Chronic left shoulder pain Acute on chronic pain. Reports this has been going on but not present at follow up when she saw orthopedic in December. No injury but has progressively worsen. She does not wish to go back to orthopedic for evaluation or possible injections.  -she has not tired any medication, encouraged to use tylenol 500 mg 1-2 tablets every 8 hours as needed for pain. Take take 2 tablets routinely in the morning. To use ice as needed and will have PT evaluate and treat at this time.  - Ambulatory referral to Physical Therapy  5. Neck pain -question if some of hand pain coming from neck vs carpel tunnel. Will  start with evaluation of xray of cervical spine.  - DG Cervical Spine Complete; Future  Next appt: 09/12/2019 as scheduled  Zaiyden Strozier K. Harle Battiest  [facility] [phone]   Virtual Visit via Video Note  I connected with [name] on 07/15/19 at  9:30 AM EST by a video enabled telemedicine application and verified that I am speaking with the correct person using two identifiers.  Location: Patient: home Provider: [facility]   I discussed the limitations of evaluation and management by telemedicine and the availability of in person appointments. The patient expressed understanding and agreed to proceed.    I discussed the assessment and treatment plan with the patient. The patient was provided an opportunity to ask questions and all were answered. The patient agreed with the plan and demonstrated an understanding of the instructions.   The patient was advised to call back or seek an in-person evaluation if the symptoms worsen or if the condition fails to improve as anticipated.  I  provided 30 minutes of non-face-to-face time during this encounter.  Carlos American. Dewaine Oats, AGNP Avs printed and mailed.

## 2019-07-16 ENCOUNTER — Telehealth: Payer: Self-pay | Admitting: Physical Therapy

## 2019-07-16 NOTE — Telephone Encounter (Signed)
Called regarding facility screening policy for physical therapy referral regarding high risk of complications from Covid to discuss if wishing to attend in person versus consider Teleheath. Patient does not wish to come in to therapy in person and would prefer to receive home health therapy. Advised contact referring provider to receive therapy referral for home health.

## 2019-07-23 ENCOUNTER — Ambulatory Visit: Payer: PPO | Admitting: Nurse Practitioner

## 2019-07-23 NOTE — Progress Notes (Signed)
CARDIOLOGY OFFICE NOTE  Date:  07/29/2019    [name] Date of Birth: 10-19-1934 Medical Record #[id number]  PCP:  [name], NP  Cardiologist:  Hazle Quant   Chief Complaint  Patient presents with  . Follow-up    Seen for Dr. Meda Coffee    History of Present Illness: [name] is a 85 y.o. female who presents today for a follow up visit. Seen for Dr. Meda Coffee. She has been seen byDr. [name] in the past. I have seen her sporadically as well.   She has multiple issues which include known CAD with past inferior MI in 2004. She has had bare-metal stent to the RCA in 2004 and drug-eluting stent placement to the RCA in 06/2010 in the setting of unstable angina with overall preserved LV function. Last cath in 2012 showing otherwise nonobstructive disease. Her other problems include morbid obesity, severe OA - basically confined to a wheelchair, COPD, DM, HTN, HLD, incontinence, OSA, polymyalgia rheumatica, anxiety, dementia,depression and gallbladder disease. She is on chronic anticoagulation forDVT/PE since September of 2013. Echo last September 2013 was a TDS but with a normal EF noted  In February 2016, she developed increasing episodes of exertional shortness of breath. A nuclear perfusion study was interpreted as high risk of inferior and anteroapical ischemia. Her Coumadin was held and she was referred to undergo cardiac catheterization. This was done on 06/29/2014 and showed normal global LV function with mild mid inferior hypocontractility. There was evidence for coronary calcification with smooth 20% proximal narrowing followed by 30% stenosis before the first diagonal branch, 50-60% stenosis in the proximal diagonal 1 vessel, 30% mid LAD smooth narrowing, and 20% smooth proximal left circumflex stenosis. Her RCA stents were widely patent.  When seen by me back last June of 2019 - she had been admitted the month prior with chest pain, SOB and swelling.  History was very hard to follow. Treated for diastolic HF. No further cardiac testing felt to be warranted. Compliance felt to be an issue. Declined SNF placement.At my visit her medicines were very unclear. Very sad situation which I did not see improving. I last saw her back in August of 2020 - very sedentary. Cardiac status felt to be ok.   The patient does not have symptoms concerning for COVID-19 infection (fever, chills, cough, or new shortness of breath).   Comes in today. Here with her daughter. She remains in a wheelchair. She notes that she had COVID back in the fall - was at [facility] for about 5 days. She recovered. Did not require ventilator.  No chest pain. She has lots of issues with neuropathy in her hands and feet. Lots of fatigue. Spends the day "not doing anything". Admits she is depressed. Apparently given Lyrica - did not tolerate - she is not really able to tell me how she did not tolerate. Labs from just a few days ago noted. No active bleeding. She remains on low dose Xarelto.   Past Medical History:  Diagnosis Date  . Allergy    takes Mucinex daily as needed  . Anemia, unspecified   . Anxiety    takes Clonazepam daily as needed  . Arthritis   . Cancer (HCC)    breast  . CHF (congestive heart failure) ([place])    takes Furosemide daily  . CKD (chronic kidney disease)   . Coronary artery disease    a. s/p IMI 2004 tx with BMS to RCA;  b. s/p  Promus DES to RCA 2/12 c. 06/2014 High risk stress test follow up cath 06/2014 showd patent stent--< med therapy for other mild to moedrate disease   . Coronary atherosclerosis of native coronary artery   . Depression    takes Cymbalta daily  . Diabetes mellitus    insulin daily  . Dysuria   . GERD (gastroesophageal reflux disease)    takes Protonix daily  . Gout, unspecified    takes Colchicine daily  . Headache    occasionally  . History of blood clots about 72yrs ago   in legs-takes Coumadin   . Hyperlipidemia    takes  Pravastatin daily  . Hypertension    takes Imdur and Metoprolol daily  . Insomnia   . Joint pain   . Joint swelling   . Myocardial infarction ([place]) 2004  . Numbness   . Obstructive sleep apnea    does not wear cpap  . Osteoarthritis   . Osteoarthritis   . Osteoarthrosis, unspecified whether generalized or localized, lower leg   . Pain, chronic   . Polymyalgia rheumatica ([place])   . Pulmonary emboli ([place]) 9/13   felt to need lifelong anticoagulation  . Shortness of breath dyspnea    with exertion and has Albuterol inhaler prn  . Type II or unspecified type diabetes mellitus without mention of complication, uncontrolled   . Urinary incontinence    takes Linzess daily  . Vertigo    hx of;was taking Meclizine if needed  . Wears dentures     Past Surgical History:  Procedure Laterality Date  . ABDOMINAL HYSTERECTOMY     partial  . APPENDECTOMY    . blood clots/legs and lungs  2013  . BREAST BIOPSY Left 07/22/2014  . BREAST BIOPSY Left 02/10/2013  . BREAST LUMPECTOMY Left 11/05/2014  . BREAST LUMPECTOMY WITH RADIOACTIVE SEED LOCALIZATION Left 11/05/2014   Procedure: LEFT BREAST LUMPECTOMY WITH RADIOACTIVE SEED LOCALIZATION;  Surgeon: [name], MD;  Location: [place];  Service: General;  Laterality: Left;  . CARDIAC CATHETERIZATION    . COLONOSCOPY    . CORONARY ANGIOPLASTY  2  . ESOPHAGOGASTRODUODENOSCOPY (EGD) WITH PROPOFOL N/A 11/07/2016   Procedure: ESOPHAGOGASTRODUODENOSCOPY (EGD) WITH PROPOFOL;  Surgeon: Gatha Mayer, MD;  Location: [facility];  Service: Endoscopy;  Laterality: N/A;  . EXCISION OF SKIN TAG Right 11/05/2014   Procedure: EXCISION OF RIGHT EYELID SKIN TAG;  Surgeon: [name], MD;  Location: [place];  Service: General;  Laterality: Right;  . EYE SURGERY Bilateral    cataract   . GASTRIC BYPASS  1977    reversed in 1979, [place] N/A 06/29/2014   Procedure: LEFT HEART CATHETERIZATION WITH  CORONARY ANGIOGRAM;  Surgeon: Troy Sine, MD;  Location: [facility] CATH LAB;  Service: Cardiovascular;  Laterality: N/A;  . MEMBRANE PEEL Right 10/23/2018   Procedure: MEMBRANE PEEL;  Surgeon: Hurman Horn, MD;  Location: [place];  Service: Ophthalmology;  Laterality: Right;  . MI with stent placement  2004  . PARS PLANA VITRECTOMY Right 10/23/2018   Procedure: PARS PLANA VITRECTOMY WITH 25 GAUGE;  Surgeon: Hurman Horn, MD;  Location: [place];  Service: Ophthalmology;  Laterality: Right;     Medications: Current Meds  Medication Sig  . albuterol (PROAIR HFA) 108 (90 Base) MCG/ACT inhaler Inhale 1-2 puffs into the lungs every 6 (six) hours as needed for wheezing or shortness of breath.  . anastrozole (ARIMIDEX) 1 MG tablet TAKE 1  TABLET BY MOUTH EVERY DAY  . budesonide-formoterol (SYMBICORT) 160-4.5 MCG/ACT inhaler Inhale 2 puffs into the lungs 2 (two) times daily. For bronchitis  . cholecalciferol (VITAMIN D3) 25 MCG (1000 UNIT) tablet Take 1,000 Units by mouth daily.  . Continuous Blood Gluc Sensor (FREESTYLE LIBRE 14 DAY SENSOR) MISC 1 Units by Does not apply route 4 (four) times daily -  before meals and at bedtime. For diabetes  . DULoxetine (CYMBALTA) 60 MG capsule TAKE 1 CAPSULE BY MOUTH EVERY DAY FOR ANXIETY  . ergocalciferol (VITAMIN D2) 1.25 MG (50000 UT) capsule Take 1 capsule (50,000 Units total) by mouth once a week.  . febuxostat (ULORIC) 40 MG tablet TAKE 1 TABLET BY MOUTH EVERY DAY  . fluticasone (FLONASE) 50 MCG/ACT nasal spray Place 2 sprays into both nostrils as needed for allergies or rhinitis.  . furosemide (LASIX) 20 MG tablet TAKE 1 TABLET BY MOUTH EVERY DAY AS NEEDED  . hydrALAZINE (APRESOLINE) 50 MG tablet TAKE 1 TABLET BY MOUTH 2 TIMES DAILY  . hydrOXYzine (ATARAX/VISTARIL) 10 MG tablet Take 10 mg by mouth 3 (three) times daily as needed.  . insulin aspart (NOVOLOG FLEXPEN) 100 UNIT/ML FlexPen Inject 16 Units into the skin 3 (three) times daily with meals. Max daily dose  70 units  . Insulin Glargine, 2 Unit Dial, (TOUJEO MAX SOLOSTAR) 300 UNIT/ML SOPN Inject 44 Units into the skin daily. Patient assistance program provides  . isosorbide mononitrate (IMDUR) 60 MG 24 hr tablet TAKE 1 TABLET BY MOUTH EVERY DAY  . loratadine (ALLERGY RELIEF) 10 MG tablet Take 10 mg by mouth daily as needed for allergies.  [name] meclizine (ANTIVERT) 25 MG tablet TAKE 1 TABLET BY MOUTH THREE TIMES DAILY AS NEEDED FOR DIZZINESS  . memantine (NAMENDA) 5 MG tablet TAKE 2 TABLETS BY MOUTH EVERY MORNING and TAKE 1 TABLET BY MOUTH EVERY EVENING FOR MEMORY  . metoprolol succinate (TOPROL-XL) 50 MG 24 hr tablet Take 50 mg by mouth daily.  . metoprolol tartrate (LOPRESSOR) 50 MG tablet Take 50 mg by mouth daily.  . nitroGLYCERIN (NITROSTAT) 0.4 MG SL tablet Take one tablet under the tongue every 5 minutes as needed for chest pain  . oxyCODONE (OXY IR/ROXICODONE) 5 MG immediate release tablet Take 1 tablet (5 mg total) by mouth every 6 (six) hours as needed for moderate pain or severe pain.  . Pantoprazole Sodium (PROTONIX PO) Take 50 mg by mouth daily.  . pravastatin (PRAVACHOL) 20 MG tablet TAKE 1 TABLET BY MOUTH EVERY DAY FOR cholesterol  . senna-docusate (SENNA S) 8.6-50 MG tablet Take 2 tablets by mouth at bedtime.  . sodium polystyrene (KAYEXALATE) 15 GM/60ML suspension 15 gm by mouth weekly to keep potassium down.  . topiramate (TOPAMAX) 25 MG tablet TAKE 1 TABLET BY MOUTH DAILY  . XARELTO 10 MG TABS tablet TAKE 1 TABLET BY MOUTH EVERY DAY  . [DISCONTINUED] pregabalin (LYRICA) 25 MG capsule Take 1 capsule (25 mg total) by mouth 2 (two) times daily.     Allergies: Allergies  Allergen Reactions  . Sulfonamide Derivatives Swelling    Mouth swelling  . Aricept [Donepezil Hcl] Diarrhea and Nausea And Vomiting    GI upset/loose stools  . Tramadol Nausea And Vomiting    Social History: The patient  reports that she has never smoked. She has never used smokeless tobacco. She reports that  she does not drink alcohol or use drugs.   Family History: [name] family history includes Arthritis in her father and sister; Breast cancer (  age of onset: 70) in her mother; Diabetes in her father and sister; Heart disease in her cousin and mother; Hypertension in her father; Obesity in her sister; Throat cancer in her father.   Review of Systems: Please see the history of present illness.   All other systems are reviewed and negative.   Physical Exam: VS:  BP (!) 144/84   Pulse 77   Ht 5\' 7"  (1.702 m)   Wt (!) 323 lb (146.5 kg)   SpO2 95%   BMI 50.59 kg/m  .  BMI Body mass index is 50.59 kg/m.  Wt Readings from Last 3 Encounters:  07/29/19 (!) 323 lb (146.5 kg)  07/24/19 (!) 322 lb (146.1 kg)  06/03/19 (!) 325 lb (147.4 kg)    General: She actually looks better today to me than at her last visit. Alert and in no acute distress.  She remains morbidly obese.  Cardiac: Regular rate and rhythm. No murmurs, rubs, or gallops. No significant edema.  Respiratory:  Lungs are fairlyclear to auscultation bilaterally with normal work of breathing.  GI: Soft and nontender.  MS: No deformity or atrophy. Gait not tested.  Skin: Warm and dry. Color is normal.  Neuro:  Strength and sensation are intact and no gross focal deficits noted.  Psych: Alert, appropriate and with normal affect.   LABORATORY DATA:  EKG:  EKG is not ordered today.   Lab Results  Component Value Date   WBC 12.1 (H) 07/24/2019   HGB 9.4 (L) 07/24/2019   HCT 31.2 (L) 07/24/2019   PLT 278 07/24/2019   GLUCOSE 294 (H) 07/24/2019   CHOL 90 07/17/2018   TRIG 74 07/17/2018   HDL 35 (L) 07/17/2018   LDLCALC 40 07/17/2018   ALT 6 07/24/2019   AST 9 (L) 07/24/2019   NA 137 07/24/2019   K 4.6 07/24/2019   CL 106 07/24/2019   CREATININE 1.64 (H) 07/24/2019   BUN 35 (H) 07/24/2019   CO2 23 07/24/2019   TSH 0.884 10/09/2017   INR 1.2 10/23/2018   HGBA1C 9.8 (A) 03/24/2019   MICROALBUR 2.9 06/14/2017      BNP (last 3 results) Recent Labs    08/27/18 1355 01/25/19 1324  BNP 57.4 21.0    ProBNP (last 3 results) No results for input(s): PROBNP in the last 8760 hours.   Other Studies Reviewed Today:  EchoStudy Conclusions6/2019  - Procedure narrative: Transthoracic echocardiography. The study was technically difficult, as a result of body habitus. - Left ventricle: The cavity size was normal. Wall thickness was increased in a pattern of moderate LVH. There was severe concentric hypertrophy. Systolic function was vigorous. The estimated ejection fraction was in the range of 65% to 70%. Doppler parameters are consistent with abnormal left ventricular relaxation (grade 1 diastolic dysfunction). The E/e&' ratio is between 8-15, suggesting indeterminate LV filling pressure. - Mitral valve: Poorly visualized. Calcified annulus. There was no significant regurgitation. - Left atrium: The atrium was normal in size. - Inferior vena cava: The vessel was normal in size. The respirophasic diameter changes were in the normal range (>= 50%), consistent with normal central venous pressure.  Impressions:  - Technically difficult study. Compared to a prior study in 07/2017, the LVEF is higher at 65-70%.   Echo 07/27/2017 Study Conclusions  - Left ventricle: The cavity size was normal. Wall thickness was increased in a pattern of severe LVH. Systolic function was normal. The estimated ejection fraction was in the range of 60% to 65%. There  is akinesis of the basalinferior myocardium. The study is not technically sufficient to allow evaluation of LV diastolic function. - Mitral valve: Severely calcified annulus.  Cath 06/29/2014 ANGIOGRAPHY:  Fluoroscopy revealed coronary calcification of left coronary system and RCA.  The left main coronary artery was a moderate-sized long vessel which wasangiographically normal and bifurcated into the  LAD and left circumflex coronary artery.   The LAD had proximal calcification. There was smooth proximal narrowing before the first septal perforating artery. There was 30% narrowing proximal to the takeoff of the first agonal vessel with 50-60% narrowing in the proximal portion of this diagonal and 30% narrowing in the LAD beyond the diagonal vessel.  The left circumflex coronary artery was a moderate size vessel that had smooth 20% proximal narrowing prior to giving off one major bifurcating marginal branch.  The RCA was large caliber vessel. There was a patent stent in the proximal RCA and a patent stent in the mid RCA. The remainder of the RCA was free of significant disease. The vessel supplied a moderate size PDA and smaller PLA vessel.  Left ventriculography revealed normal global LV contractility with mild mid inferior hypocontractility. There was no evidence for mitral regurgitation. Ejection fraction is 55-60%.  IMPRESSION:  Normal global LV function with mild mid inferior hypocontractility.  Coronary calcification with smooth 20% proximal narrowing followed by 30% stenosis before the first diagonal branch, 50-60% stenosis in the proximal diagonal 1 vessel with 30% mid LAD smooth narrowing; 20% smooth stenosis in the proximal left circumflex: Artery, and widely patent proximal and mid RCA stents without significant RCA stenoses.  RECOMMENDATION:  Medical therapy.   Assessment/Plan:   1. CAD - remote PCI - managed conservatively - no worrisome symptoms at this time.   2. Chronic diastolic HF - recent labs noted. Chronic DOE which is most likely multifactorial.   3. Morbid obesity - unfortunately, I do not see this changing - tried to be encouraging.   4. HTN - BP is fair - would monitor.   5. Uncontrolled DM   6. CKD - recent lab noted  7. Anemia - probably from CKD - no active bleeding noted. She remains on low dose Xarelto - this will need to be  followed.   8. Prior PE/DVT - on Xarelto - labs from a few days ago noted.   9. Dementia  10. COVID-19 Education: The signs and symptoms of COVID-19 were discussed with the patient and how to seek care for testing (follow up with PCP or arrange E-visit).  The importance of social distancing, staying at home, hand hygiene and wearing a mask when out in public were discussed today. Getting second vaccine later this week. She has had prior COVID illness as noted.   Current medicines are reviewed with the patient today.  The patient does not have concerns regarding medicines other than what has been noted above.  The following changes have been made:  See above.  Labs/ tests ordered today include:   No orders of the defined types were placed in this encounter.    Disposition:   FU with [place] in about 9 months.    Patient is agreeable to this plan and will call if any problems develop in the interim.   SignedTruitt Merle, NP  07/29/2019 2:10 PM  [facility] [address] [place], [place]  [address] Phone: [phone] Fax: [phone]

## 2019-07-24 ENCOUNTER — Other Ambulatory Visit: Payer: Self-pay

## 2019-07-24 ENCOUNTER — Inpatient Hospital Stay: Payer: PPO | Admitting: Hematology

## 2019-07-24 ENCOUNTER — Inpatient Hospital Stay: Payer: PPO | Attending: Hematology

## 2019-07-24 VITALS — BP 146/65 | HR 74 | Temp 98.0°F | Resp 18 | Ht 67.0 in | Wt 322.0 lb

## 2019-07-24 DIAGNOSIS — E875 Hyperkalemia: Secondary | ICD-10-CM | POA: Diagnosis not present

## 2019-07-24 DIAGNOSIS — D649 Anemia, unspecified: Secondary | ICD-10-CM

## 2019-07-24 DIAGNOSIS — Z794 Long term (current) use of insulin: Secondary | ICD-10-CM | POA: Diagnosis not present

## 2019-07-24 DIAGNOSIS — Z170 Estrogen receptor positive status [ER+]: Secondary | ICD-10-CM

## 2019-07-24 DIAGNOSIS — E119 Type 2 diabetes mellitus without complications: Secondary | ICD-10-CM | POA: Diagnosis not present

## 2019-07-24 DIAGNOSIS — E559 Vitamin D deficiency, unspecified: Secondary | ICD-10-CM | POA: Diagnosis not present

## 2019-07-24 DIAGNOSIS — Z79899 Other long term (current) drug therapy: Secondary | ICD-10-CM | POA: Insufficient documentation

## 2019-07-24 DIAGNOSIS — R52 Pain, unspecified: Secondary | ICD-10-CM

## 2019-07-24 DIAGNOSIS — C50812 Malignant neoplasm of overlapping sites of left female breast: Secondary | ICD-10-CM | POA: Diagnosis not present

## 2019-07-24 DIAGNOSIS — Z79811 Long term (current) use of aromatase inhibitors: Secondary | ICD-10-CM | POA: Insufficient documentation

## 2019-07-24 DIAGNOSIS — C50112 Malignant neoplasm of central portion of left female breast: Secondary | ICD-10-CM | POA: Insufficient documentation

## 2019-07-24 LAB — CBC WITH DIFFERENTIAL/PLATELET
Abs Immature Granulocytes: 0.16 10*3/uL — ABNORMAL HIGH (ref 0.00–0.07)
Basophils Absolute: 0 10*3/uL (ref 0.0–0.1)
Basophils Relative: 0 %
Eosinophils Absolute: 0.2 10*3/uL (ref 0.0–0.5)
Eosinophils Relative: 2 %
HCT: 31.2 % — ABNORMAL LOW (ref 36.0–46.0)
Hemoglobin: 9.4 g/dL — ABNORMAL LOW (ref 12.0–15.0)
Immature Granulocytes: 1 %
Lymphocytes Relative: 20 %
Lymphs Abs: 2.4 10*3/uL (ref 0.7–4.0)
MCH: 27.1 pg (ref 26.0–34.0)
MCHC: 30.1 g/dL (ref 30.0–36.0)
MCV: 89.9 fL (ref 80.0–100.0)
Monocytes Absolute: 0.6 10*3/uL (ref 0.1–1.0)
Monocytes Relative: 5 %
Neutro Abs: 8.7 10*3/uL — ABNORMAL HIGH (ref 1.7–7.7)
Neutrophils Relative %: 72 %
Platelets: 278 10*3/uL (ref 150–400)
RBC: 3.47 MIL/uL — ABNORMAL LOW (ref 3.87–5.11)
RDW: 14.4 % (ref 11.5–15.5)
WBC: 12.1 10*3/uL — ABNORMAL HIGH (ref 4.0–10.5)
nRBC: 0 % (ref 0.0–0.2)

## 2019-07-24 LAB — CMP (CANCER CENTER ONLY)
ALT: 6 U/L (ref 0–44)
AST: 9 U/L — ABNORMAL LOW (ref 15–41)
Albumin: 2.8 g/dL — ABNORMAL LOW (ref 3.5–5.0)
Alkaline Phosphatase: 92 U/L (ref 38–126)
Anion gap: 8 (ref 5–15)
BUN: 35 mg/dL — ABNORMAL HIGH (ref 8–23)
CO2: 23 mmol/L (ref 22–32)
Calcium: 8.9 mg/dL (ref 8.9–10.3)
Chloride: 106 mmol/L (ref 98–111)
Creatinine: 1.64 mg/dL — ABNORMAL HIGH (ref 0.44–1.00)
GFR, Est AFR Am: 33 mL/min — ABNORMAL LOW (ref >60)
GFR, Estimated: 29 mL/min — ABNORMAL LOW (ref >60)
Glucose, Bld: 294 mg/dL — ABNORMAL HIGH (ref 70–99)
Potassium: 4.6 mmol/L (ref 3.5–5.1)
Sodium: 137 mmol/L (ref 135–145)
Total Bilirubin: 0.3 mg/dL (ref 0.3–1.2)
Total Protein: 7.6 g/dL (ref 6.5–8.1)

## 2019-07-24 LAB — IRON AND TIBC
Iron: 41 ug/dL (ref 41–142)
Saturation Ratios: 18 % — ABNORMAL LOW (ref 21–57)
TIBC: 228 ug/dL — ABNORMAL LOW (ref 236–444)
UIBC: 187 ug/dL (ref 120–384)

## 2019-07-24 LAB — FERRITIN: Ferritin: 101 ng/mL (ref 11–307)

## 2019-07-24 LAB — VITAMIN D 25 HYDROXY (VIT D DEFICIENCY, FRACTURES): Vit D, 25-Hydroxy: 21.76 ng/mL — ABNORMAL LOW (ref 30–100)

## 2019-07-24 MED ORDER — SENNOSIDES-DOCUSATE SODIUM 8.6-50 MG PO TABS: 2.0000 | ORAL_TABLET | Freq: Every day | ORAL | 3 refills | Status: DC

## 2019-07-24 MED ORDER — OXYCODONE HCL 5 MG PO TABS: 5.0000 mg | ORAL_TABLET | Freq: Four times a day (QID) | ORAL | 0 refills | Status: DC | PRN

## 2019-07-24 NOTE — Progress Notes (Signed)
.    Hematology oncology Clinic Followup Note  Date of service 07/24/19   Patient Care Team: [name], NP as PCP - General (Geriatric Medicine) [name], [name], MD (Inactive) as Consulting Physician (Cardiology) [name], MD as Consulting Physician (Ophthalmology) [name], MD as Consulting Physician (General Surgery) [name], MD (Inactive) as Referring Physician (Obstetrics and Gynecology) [name] [name], MD as Consulting Physician (Ophthalmology) [facility], [name], MD as Consulting Physician (Endocrinology) [name], MD as Consulting Physician (Hematology)  CHIEF COMPLAINTS/PURPOSE OF CONSULTATION:  F/u for breast cancer.  DIAGNOSIS: left-sided presumed stage IA  (pT1c,pNx[cN0], cM0) invasive ductal carcinoma grade 2 out of 3, strongly ER +100%, strongly PR +100% and HER-2/neu negative. Given her high risk cardiac status lumpectomy had to be done under local anesthesia and sentinel lymph node biopsy was not possible. She has accompanying DCIS of intermediate grade present at margins. ECOG performance status is 3. -Had a mammogram/tomosynthesis done on 07/29/2015 which shows residual suspicious calcification in the left breast postero-medial to the biopsy site. Has known residual DCIS.  No evidence of new malignancy.  No evidence of right breast malignancy.   CURRENT TREATMENT: Arimidex 31m po daily  HISTORY OF PRESENTING ILLNESS: -please see my initial consultation for details of initial presentation   INTERVAL HISTORY   Mrs. CPennyis here for management and evaluation of her breast cancer. [name] last visit with [place] on 12/24/2018. We are joined by pt daughter. The pt reports that she is doing well overall.  The pt reports she has been doing okay. Pt's left hands and feet have been numb. PCP believes that it is due to neuropathy. She has been very week recently. Diabetes has been well controlled by PCP. She is still taking  Xarelto. Pt has not been taking iron pills. Pt reports she does not have enough energy to be moving around. She is not walking at all during the day. PCP feels like fatigue is due to depression and anxiety. Pt reports SOB, fatigue, and joint pain when she tries to walk and move around. She has not been leaving house other than doctors visits. Oxycodone had been helping but PCP stopped prescribing it.  She had been taking her Vitamin D and Vitamin B12 supplements. Pt wishes to be able to be able to move around, have energy, read, spend time with family.   Lab results today (07/24/19) of CBC w/diff and CMP is as follows: all values are WNL except for WBC at 12.1K, RBC at 3.47, Hemoglobin at 9.4, HCT at 31.2, Neutro Abs at 8.7K, Abs Immature Granulocytes at 0.16K, Glucose at 294, BUN at 35, Creatinine at 1.64, Albumin at 2.8, AST at 9, GFR, Est Non Af Am at 29, GFR, Est AFR at Am at 33. 07/24/2019 of Vitamin D 25 hydroxy at 21.76 07/24/2019 of Ferritin 101  On review of systems, pt reports numbness in extremities, fatigue, depression, anxiety, night sweats, cold and denies abdominal pain, bleeding, new breast pains, new breast lumps, hot flashes, gout flare ups,  and any other symptoms.     MEDICAL HISTORY:  Past Medical History:  Diagnosis Date  . Allergy    takes Mucinex daily as needed  . Anemia, unspecified   . Anxiety    takes Clonazepam daily as needed  . Arthritis   . Cancer (HCC)    breast  . CHF (congestive heart failure) ([facility] [employer]    takes Furosemide daily  . CKD (chronic kidney disease)   .  Coronary artery disease    a. s/p IMI 2004 tx with BMS to RCA;  b. s/p Promus DES to RCA 2/12 c. 06/2014 High risk stress test follow up cath 06/2014 showd patent stent--< med therapy for other mild to moedrate disease   . Coronary atherosclerosis of native coronary artery   . Depression    takes Cymbalta daily  . Diabetes mellitus    insulin daily  . Dysuria   . GERD (gastroesophageal reflux  disease)    takes Protonix daily  . Gout, unspecified    takes Colchicine daily  . Headache    occasionally  . History of blood clots about 24yr ago   in legs-takes Coumadin   . Hyperlipidemia    takes Pravastatin daily  . Hypertension    takes Imdur and Metoprolol daily  . Insomnia   . Joint pain   . Joint swelling   . [place]ocardial infarction ([place]  . Numbness   . Obstructive sleep apnea    does not wear cpap  . Osteoarthritis   . Osteoarthritis   . Osteoarthrosis, unspecified whether generalized or localized, lower leg   . Pain, chronic   . Pol[place]algia rheumatica ([place]   . [place] 9[place]   felt to need lifelong anticoagulation  . Shortness of breath dyspnea    with exertion and has Albuterol inhaler prn  . Type II or unspecified type diabetes mellitus without mention of complication, uncontrolled   . Urinary incontinence    takes Linzess daily  . Vertigo    hx of;was taking Meclizine if needed  . Wears dentures     SURGICAL HISTORY: Past Surgical History:  Procedure Laterality Date  . ABDOMINAL HYSTERECTOMY     partial  . APPENDECTOMY    . blood clots/legs and lungs  2013  . BREAST BIOPSY Left 07/22/2014  . BREAST BIOPSY Left 02/10/2013  . BREAST LUMPECTOMY Left 11/05/2014  . BREAST LUMPECTOMY WITH RADIOACTIVE SEED LOCALIZATION Left 11/05/2014   Procedure: LEFT BREAST LUMPECTOMY WITH RADIOACTIVE SEED LOCALIZATION;  Surgeon: DCoralie Keens MD;  [facility]  Service: General;  Laterality: Left;  . CARDIAC CATHETERIZATION    . COLONOSCOPY    . CORONARY ANGIOPLASTY  2  . ESOPHAGOGASTRODUODENOSCOPY (EGD) WITH PROPOFOL N/A 11/07/2016   Procedure: ESOPHAGOGASTRODUODENOSCOPY (EGD) WITH PROPOFOL;  Surgeon: GGatha Mayer MD;  [facility];  Service: Endoscopy;  Laterality: N/A;  . EXCISION OF SKIN TAG Right 11/05/2014   Procedure: EXCISION OF RIGHT EYELID SKIN TAG;  Surgeon: DCoralie Keens MD;  [facility]  Service: General;   Laterality: Right;  . EYE SURGERY Bilateral    cataract   . GASTRIC BYPASS  1977    reversed in 1979, DBentonN/A 06/29/2014   Procedure: LEFT HEART CATHETERIZATION WITH CORONARY ANGIOGRAM;  Surgeon: TTroy Sine MD;  [facility];  Service: Cardiovascular;  Laterality: N/A;  . MEMBRANE PEEL Right 10/23/2018   Procedure: MEMBRANE PEEL;  Surgeon: RHurman Horn MD;  [facility]  Service: Ophthalmology;  Laterality: Right;  . MI with stent placement  2004  . PARS PLANA VITRECTOMY Right 10/23/2018   Procedure: PARS PLANA VITRECTOMY WITH 25 GAUGE;  Surgeon: RHurman Horn MD;  [facility]  Service: Ophthalmology;  Laterality: Right;    SOCIAL HISTORY: Social History   Socioeconomic History  . Marital status: Widowed    Spouse name: Not on file  . Number  of children: Not on file  . Years of education: Not on file  . Highest education level: Not on file  Occupational History  . Occupation: retired  Tobacco Use  . Smoking status: Never Smoker  . Smokeless tobacco: Never Used  Substance and Sexual Activity  . Alcohol use: No    Alcohol/week: 0.0 standard drinks  . Drug use: No  . Sexual activity: Not Currently  Other Topics Concern  . Not on file  Social History Narrative  . Not on file   Social Determinants of Health   Financial Resource Strain:   . Difficulty of Paying Living Expenses:   Food Insecurity:   . Worried About Charity fundraiser in the Last Year:   . Arboriculturist in the Last Year:   Transportation Needs:   . Film/video editor (Medical):   [name] Lack of Transportation (Non-Medical):   Physical Activity:   . Days of Exercise per Week:   . Minutes of Exercise per Session:   Stress:   . Feeling of Stress :   Social Connections:   . Frequency of Communication with Friends and Family:   . Frequency of Social Gatherings with Friends and Family:   . Attends Religious Services:     . Active Member of Clubs or Organizations:   . Attends Archivist Meetings:   [name] Marital Status:   Intimate Partner Violence:   . Fear of Current or Ex-Partner:   . Emotionally Abused:   [name] Physically Abused:   . Sexually Abused:     FAMILY HISTORY: Family History  Problem Relation Age of Onset  . Breast cancer Mother 31  . Heart disease Mother   . Throat cancer Father   . Hypertension Father   . Arthritis Father   . Diabetes Father   . Arthritis Sister   . Obesity Sister   . Diabetes Sister   . Heart disease Cousin   . Colon cancer Neg Hx   . Stomach cancer Neg Hx   . Esophageal cancer Neg Hx     ALLERGIES:  is allergic to sulfonamide derivatives; aricept [donepezil hcl]; and tramadol.  MEDICATIONS:  Current Outpatient Medications  Medication Sig Dispense Refill  . albuterol (PROAIR HFA) 108 (90 Base) MCG/ACT inhaler Inhale 1-2 puffs into the lungs every 6 (six) hours as needed for wheezing or shortness of breath. 6.7 g 1  . anastrozole (ARIMIDEX) 1 MG tablet TAKE 1 TABLET BY MOUTH EVERY DAY 90 tablet 1  . budesonide-formoterol (SYMBICORT) 160-4.5 MCG/ACT inhaler Inhale 2 puffs into the lungs 2 (two) times daily. For bronchitis 1 Inhaler 3  . Continuous Blood Gluc Sensor (FREESTYLE LIBRE 14 DAY SENSOR) MISC 1 Units by Does not apply route 4 (four) times daily -  before meals and at bedtime. For diabetes 2 each 12  . DULoxetine (CYMBALTA) 60 MG capsule TAKE 1 CAPSULE BY MOUTH EVERY DAY FOR ANXIETY 90 capsule 1  . febuxostat (ULORIC) 40 MG tablet TAKE 1 TABLET BY MOUTH EVERY DAY 90 tablet 1  . fluticasone (FLONASE) 50 MCG/ACT nasal spray Place 2 sprays into both nostrils as needed for allergies or rhinitis.    . furosemide (LASIX) 20 MG tablet TAKE 1 TABLET BY MOUTH EVERY DAY AS NEEDED 30 tablet 6  . hydrALAZINE (APRESOLINE) 50 MG tablet TAKE 1 TABLET BY MOUTH 2 TIMES DAILY 60 tablet 3  . insulin aspart (NOVOLOG FLEXPEN) 100 UNIT/ML FlexPen Inject 16 Units into the  skin 3 (three)  times daily with meals. Max daily dose 70 units 63 mL 3  . Insulin Glargine, 2 Unit Dial, (TOUJEO MAX SOLOSTAR) 300 UNIT/ML SOPN Inject 44 Units into the skin daily. Patient assistance program provides 10 pen 11  . isosorbide mononitrate (IMDUR) 60 MG 24 hr tablet TAKE 1 TABLET BY MOUTH EVERY DAY 90 tablet 1  . loratadine (ALLERGY RELIEF) 10 MG tablet Take 10 mg by mouth daily as needed for allergies.    [name] meclizine (ANTIVERT) 25 MG tablet TAKE 1 TABLET BY MOUTH THREE TIMES DAILY AS NEEDED FOR DIZZINESS 270 tablet 1  . memantine (NAMENDA) 5 MG tablet TAKE 2 TABLETS BY MOUTH EVERY MORNING and TAKE 1 TABLET BY MOUTH EVERY EVENING FOR MEMORY 90 tablet 5  . metoprolol tartrate (LOPRESSOR) 50 MG tablet Take 50 mg by mouth daily.    . nitroGLYCERIN (NITROSTAT) 0.4 MG SL tablet Take one tablet under the tongue every 5 minutes as needed for chest pain 50 tablet 0  . Pantoprazole Sodium (PROTONIX PO) Take 50 mg by mouth daily.    . pravastatin (PRAVACHOL) 20 MG tablet TAKE 1 TABLET BY MOUTH EVERY DAY FOR cholesterol 90 tablet 1  . pregabalin (LYRICA) 25 MG capsule Take 1 capsule (25 mg total) by mouth 2 (two) times daily. 60 capsule 1  . sodium polystyrene (KAYEXALATE) 15 GM/60ML suspension 15 gm by mouth weekly to keep potassium down. 473 mL 2  . topiramate (TOPAMAX) 25 MG tablet TAKE 1 TABLET BY MOUTH DAILY 90 tablet 1  . XARELTO 10 MG TABS tablet TAKE 1 TABLET BY MOUTH EVERY DAY 90 tablet 1   No current facility-administered medications for this visit.    REVIEW OF SYSTEMS:   A 10+ POINT REVIEW OF SYSTEMS WAS OBTAINED including neurology, dermatology, psychiatry, cardiac, respiratory, lymph, extremities, GI, GU, Musculoskeletal, constitutional, breasts, reproductive, HEENT.  All pertinent positives are noted in the HPI.  All others are negative.    PHYSICAL EXAMINATION:  ECOG PERFORMANCE STATUS: 3 - Symptomatic, >50% confined to bed  Vitals:   07/24/19 1509  BP: (!) 146/65    Pulse: 74  Resp: 18  Temp: 98 F (36.7 C)  SpO2: 100%   Filed Weights   07/24/19 1509  Weight: (!) 322 lb (146.1 kg)   Exam was given in a wheelchair.   Exam given in wheelchair   GENERAL:alert, in no acute distress and comfortable SKIN: no acute rashes, no significant lesions EYES: conjunctiva are pink and non-injected, sclera anicteric OROPHARYNX: MMM, no exudates, no oropharyngeal erythema or ulceration NECK: supple, no JVD LYMPH:  no palpable lymphadenopathy in the cervical, axillary or inguinal regions LUNGS: clear to auscultation b/l with normal respiratory effort HEART: regular rate & rhythm ABDOMEN:  normoactive bowel sounds , non tender, not distended. Extremity: no pedal edema PSYCH: alert & oriented x 3 with fluent speech NEURO: no focal motor/sensory deficits   LABORATORY DATA:  I have reviewed the data as listed  . CBC Latest Ref Rng & Units 07/24/2019 02/12/2019 01/30/2019  WBC 4.0 - 10.5 K/uL 12.1(H) 7.2 11.1(H)  Hemoglobin 12.0 - 15.0 g/dL 9.4(L) 9.1(L) 8.9(L)  Hematocrit 36.0 - 46.0 % 31.2(L) 28.0(L) 27.5(L)  Platelets 150 - 400 K/uL 278 200 315   . CBC    Component Value Date/Time   WBC 12.1 (H) 07/24/2019 1438   RBC 3.47 (L) 07/24/2019 1438   HGB 9.4 (L) 07/24/2019 1438   HGB 8.5 (L) 10/23/2017 1418   HGB 8.7 (L) 03/21/2017 1351  HCT 31.2 (L) 07/24/2019 1438   HCT 28.9 (L) 03/21/2017 1351   PLT 278 07/24/2019 1438   PLT 317 10/23/2017 1418   PLT 295 03/21/2017 1351   MCV 89.9 07/24/2019 1438   MCV 84.3 03/21/2017 1351   MCH 27.1 07/24/2019 1438   MCHC 30.1 07/24/2019 1438   RDW 14.4 07/24/2019 1438   RDW 15.5 (H) 03/21/2017 1351   LYMPHSABS 2.4 07/24/2019 1438   LYMPHSABS 2.4 03/21/2017 1351   MONOABS 0.6 07/24/2019 1438   MONOABS 0.7 03/21/2017 1351   EOSABS 0.2 07/24/2019 1438   EOSABS 0.2 03/21/2017 1351   BASOSABS 0.0 07/24/2019 1438   BASOSABS 0.0 03/21/2017 1351   . CMP Latest Ref Rng & Units 07/24/2019 02/12/2019 01/30/2019   Glucose 70 - 99 mg/dL 294(H) 218(H) 246(H)  BUN 8 - 23 mg/dL 35(H) 31(H) 52(H)  Creatinine 0.44 - 1.00 mg/dL 1.64(H) 1.52(H) 1.31(H)  Sodium 135 - 145 mmol/L 137 140 134(L)  Potassium 3.5 - 5.1 mmol/L 4.6 4.7 4.9  Chloride 98 - 111 mmol/L 106 106 102  CO2 22 - 32 mmol/L _0 Calcium 8.9 - 10.3 mg/dL 8.9 8.9 8.9  Total Protein 6.5 - 8.1 g/dL 7.6 6.6 7.2  Total Bilirubin 0.3 - 1.2 mg/dL 0.3 0.5 0.3  Alkaline Phos 38 - 126 U/L 92 - 61  AST 15 - 41 U/L 9(L) 18 17  ALT 0 - 44 U/L _1 RADIOGRAPHIC STUDIES: I have personally reviewed the radiological images as listed and agreed with the findings in the report.  No results found. Diagnostic mammogram b/l 11/01/17  IMPRESSION: No significant change in suspicious microcalcifications posterior to the lumpectomy site in the left breast compared to the mammogram of 2018. No suspicious findings in the right breast. Exclusion of some posterior tissue due to patient decreased mobility.  RECOMMENDATION: Diagnostic mammogram is suggested in 1 year. (Code:[id number]-B-01Y)  CLINICAL DATA:  Annual mammography. The patient had surgery for DCIS in the left breast in 2016. There were positive margins at surgery but the patient could not return for additional surgery. Known suspicious calcifications remain. EXAM: 2D DIGITAL DIAGNOSTIC BILATERAL MAMMOGRAM WITH CAD AND ADJUNCT TOMO COMPARISON:  Previous exam(s). ACR Breast Density Category b: There are scattered areas of fibroglandular density. FINDINGS: The suspicious calcifications posterior to the left lumpectomy site are stable. No other interval changes or other suspicious findings. Mammographic images were processed with CAD. IMPRESSION: Continued suspicious calcifications posterior to the left lumpectomy site. No other changes. RECOMMENDATION: Continued annual mammography for surveillance. Continued oncologic follow up. I have discussed the findings and recommendations with  the patient. Results were also provided in writing at the conclusion of the visit. If applicable, a reminder letter will be sent to the patient regarding the next appointment. BI-RADS CATEGORY  6: Known biopsy-proven malignancy.   Electronically Signed   By: Dorise Bullion III M.D   On: 08/16/2016 15:21   ASSESSMENT & PLAN:   Mrs. Cullifer is a very wonderful 85 y.o. African-American female with multiple medical comorbidities as described above with   #1 Left-sided presumed stage IA  (pT1c,pNx[cN0], cM0) invasive ductal carcinoma grade 2 out of 3, strongly ER +100%, strongly PR +100% and HER-2/neu negative. Given her high risk cardiac status lumpectomy had to be done under local anesthesia and sentinel lymph node biopsy was not possible. She has accompanying DCIS of intermediate grade present at margins. ECOG performance status is 3. Dexa scan "low bone  mass" per WHO. -Bone density scan on 03/12/2017 with results showing: T-score of 0.5 at left forearm. Lumbar spine and dual femurs not completed due to patient in a wheelchair and not able to lay flat due to SOB while laying flat.   11/01/17 Mammogram stable.   #2 significant coronary artery disease with stress test in February 2016 suggesting likely multivessel disease. Has uncontrolled diabetes, hypertension, dyslipidemia, untreated sleep apnea, coronary disease, severe arthritis all of which are significantly limiting her quality of life.  #3 vitamin D deficiency - 25OH vitamin D level of 10, s/p high dose ergocalciferol re-placement with 25OH Vit D improvement to 30.9 in 10/2017. Now on vit D 2000 units daily.   #4 Worsening Anemia ? Slow GI losses . Multiple metabolic insults in the bone marrow including uncontrolled diabetes .Patient notes that her fasting glucose and so and in the 300s   #5 Hyperkalemia K 5.5 -- in setting of CKD and ? RTA type IV from DM2  PLAN: -Discussed pt labwork today, 07/24/19; of CBC w/diff and CMP is as  follows: all values are WNL except for WBC at 12.1K, RBC at 3.47, Hemoglobin at 9.4, HCT at 31.2, Neutro Abs at 8.7K, Abs Immature Granulocytes at 0.16K,Glucose at 294, BUN at 35, Creatinine at 1.64, Albumin at 2.8, AST at 9, GFR, Est Non Af Am at 29, GFR, Est AFR at Am at 33. -Discussed 07/24/2019 of Vitamin D 25 hydroxy at 21.76 -- Ergocalciferol 50k weekly ordered -Discussed 07/24/2019 of Ferritin at 101 -Advised on depression -Advised fatigue is not due to one single thing- needs to be addressed with PCP -Recommends f/u with PCP for neuropathy  -Recommends f/u with PCP for continuing lyrica/cymbalta -Recommends taking iron supplement OTC -Recommends getting hearing aide -f/u with PCP about being homebound  -refilled patient oxycodone for debilitating joint/bone pains from OA, AI and Diabeticneuropathy. -Recommends f/u with PCP for fatigue- possibly see psychiatrist/psychological councilor  -Pt would like to get home care  -Recommends finding things that bring joy and find what's important to her -The pt has no prohibitive toxicities from continuing Arimidex at this time. -Will see back in 3 months with labs   FOLLOW UP: RTC with Dr Irene Limbo with labs in 3 months   Continue follow-up with your primary care physician Dr. Eulas Post for other ongoing cares.  The total time spent in the appt was 30 minutes and more than 50% was on counseling and direct patient cares.  All of the patient's questions were answered with apparent satisfaction. The patient knows to call the clinic with any problems, questions or concerns.    Sullivan Lone MD MS Hematology/Oncology Physician [facility]  (Office):       [phone] (Work cell):  [phone] (Fax):           [phone]  I, Dawayne Cirri am acting as a scribe for Dr. Sullivan Lone.   .I have reviewed the above documentation for accuracy and completeness, and I agree with the above. [name] MD

## 2019-07-28 ENCOUNTER — Telehealth: Payer: Self-pay | Admitting: Hematology

## 2019-07-28 MED ORDER — ERGOCALCIFEROL 1.25 MG (50000 UT) PO CAPS: 50000.0000 [IU] | ORAL_CAPSULE | ORAL | 3 refills | Status: DC

## 2019-07-28 NOTE — Telephone Encounter (Signed)
Scheduled appt per 3/22 sch message - pt is aware of appt date and time   

## 2019-07-29 ENCOUNTER — Encounter: Payer: Self-pay | Admitting: Nurse Practitioner

## 2019-07-29 ENCOUNTER — Other Ambulatory Visit: Payer: Self-pay

## 2019-07-29 ENCOUNTER — Ambulatory Visit: Payer: PPO | Admitting: Nurse Practitioner

## 2019-07-29 VITALS — BP 144/84 | HR 77 | Ht 67.0 in | Wt 323.0 lb

## 2019-07-29 DIAGNOSIS — I5032 Chronic diastolic (congestive) heart failure: Secondary | ICD-10-CM | POA: Diagnosis not present

## 2019-07-29 DIAGNOSIS — I10 Essential (primary) hypertension: Secondary | ICD-10-CM | POA: Diagnosis not present

## 2019-07-29 DIAGNOSIS — I259 Chronic ischemic heart disease, unspecified: Secondary | ICD-10-CM

## 2019-07-29 DIAGNOSIS — Z7189 Other specified counseling: Secondary | ICD-10-CM | POA: Diagnosis not present

## 2019-07-29 NOTE — Patient Instructions (Addendum)
After Visit Summary:  We will be checking the following labs today - NONE   Medication Instructions:    Continue with your current medicines.    If you need a refill on your cardiac medications before your next appointment, please call your pharmacy.     Testing/Procedures To Be Arranged:  N/A  Follow-Up:   See [place] in 9 months -  You will receive a reminder letter in the mail two months in advance. If you don't receive a letter, please call our office to schedule the follow-up appointment.    At [facility], you and your health needs are our priority.  As part of our continuing mission to provide you with exceptional heart care, we have created designated Provider Care Teams.  These Care Teams include your primary Cardiologist (physician) and Advanced Practice Providers (APPs -  Physician Assistants and Nurse Practitioners) who all work together to provide you with the care you need, when you need it.  Special Instructions:  . Stay safe, stay home, wash your hands for at least 20 seconds and wear a mask when out in public.  . It was good to talk with you today.  . Remember - baby steps!   Call the [place] office at [phone] if you have any questions, problems or concerns.

## 2019-08-05 ENCOUNTER — Ambulatory Visit: Payer: PPO | Attending: Internal Medicine

## 2019-08-05 DIAGNOSIS — Z23 Encounter for immunization: Secondary | ICD-10-CM

## 2019-08-05 NOTE — Progress Notes (Signed)
   Covid-19 Vaccination Clinic  Name:  [name]    MRN: [id number] DOB: [birth year]  08/05/2019  [name] was observed post Covid-19 immunization for 15 minutes without incident. She was provided with Vaccine Information Sheet and instruction to access the V-Safe system.   [name] was instructed to call 911 with any severe reactions post vaccine: [name] Difficulty breathing  . Swelling of face and throat  . A fast heartbeat  . A bad rash all over body  . Dizziness and weakness   Immunizations Administered    Name Date Dose VIS Date Route   Pfizer COVID-19 Vaccine 08/05/2019 10:30 AM 0.3 mL 04/18/2019 Intramuscular   Manufacturer: [employer], [employer]   Lot: [id number]   [place]: [id number]

## 2019-08-21 ENCOUNTER — Other Ambulatory Visit: Payer: Self-pay | Admitting: Nurse Practitioner

## 2019-08-21 NOTE — Telephone Encounter (Signed)
rx sent to pharmacy by e-script  

## 2019-08-26 ENCOUNTER — Telehealth (INDEPENDENT_AMBULATORY_CARE_PROVIDER_SITE_OTHER): Payer: Self-pay

## 2019-08-27 ENCOUNTER — Other Ambulatory Visit: Payer: Self-pay

## 2019-08-27 ENCOUNTER — Ambulatory Visit (INDEPENDENT_AMBULATORY_CARE_PROVIDER_SITE_OTHER): Payer: PPO | Admitting: Ophthalmology

## 2019-08-27 ENCOUNTER — Encounter (INDEPENDENT_AMBULATORY_CARE_PROVIDER_SITE_OTHER): Payer: Self-pay | Admitting: Ophthalmology

## 2019-08-27 DIAGNOSIS — H353132 Nonexudative age-related macular degeneration, bilateral, intermediate dry stage: Secondary | ICD-10-CM | POA: Insufficient documentation

## 2019-08-27 DIAGNOSIS — H35371 Puckering of macula, right eye: Secondary | ICD-10-CM | POA: Diagnosis not present

## 2019-08-27 DIAGNOSIS — E113393 Type 2 diabetes mellitus with moderate nonproliferative diabetic retinopathy without macular edema, bilateral: Secondary | ICD-10-CM | POA: Diagnosis not present

## 2019-08-27 DIAGNOSIS — H43821 Vitreomacular adhesion, right eye: Secondary | ICD-10-CM | POA: Diagnosis not present

## 2019-08-27 DIAGNOSIS — Z09 Encounter for follow-up examination after completed treatment for conditions other than malignant neoplasm: Secondary | ICD-10-CM | POA: Insufficient documentation

## 2019-08-27 DIAGNOSIS — E119 Type 2 diabetes mellitus without complications: Secondary | ICD-10-CM | POA: Insufficient documentation

## 2019-08-27 NOTE — Progress Notes (Signed)
08/27/2019     CHIEF COMPLAINT Patient presents for Retina Follow Up   HISTORY OF PRESENT ILLNESS: [name] is a 85 y.o. female who presents to the clinic today for:   HPI    Retina Follow Up    Patient presents with  Diabetic Retinopathy.  In both eyes.  Duration of 8 months.  Since onset it is stable.          Comments    8 month follow up - OCT OU Patient states she has a hard time seeing at a distance now, and she has light sensitivity. Patient states she also has more floaters. LBS 161 this AM A1C unknown       Last edited by Hurman Horn, MD on 08/27/2019  4:05 PM. (History)      Referring physician: [name], NP [place],  [place] [id number]  HISTORICAL INFORMATION:   Selected notes from the MEDICAL RECORD NUMBER    Lab Results  Component Value Date   HGBA1C 9.8 (A) 03/24/2019     CURRENT MEDICATIONS: No current outpatient medications on file. (Ophthalmic Drugs)   No current facility-administered medications for this visit. (Ophthalmic Drugs)   Current Outpatient Medications (Other)  Medication Sig  . albuterol (PROAIR HFA) 108 (90 Base) MCG/ACT inhaler Inhale 1-2 puffs into the lungs every 6 (six) hours as needed for wheezing or shortness of breath.  . anastrozole (ARIMIDEX) 1 MG tablet TAKE 1 TABLET BY MOUTH EVERY DAY  . budesonide-formoterol (SYMBICORT) 160-4.5 MCG/ACT inhaler Inhale 2 puffs into the lungs 2 (two) times daily. For bronchitis  . cholecalciferol (VITAMIN D3) 25 MCG (1000 UNIT) tablet Take 1,000 Units by mouth daily.  . Continuous Blood Gluc Sensor (FREESTYLE LIBRE 14 DAY SENSOR) MISC 1 Units by Does not apply route 4 (four) times daily -  before meals and at bedtime. For diabetes  . DULoxetine (CYMBALTA) 60 MG capsule TAKE 1 CAPSULE BY MOUTH EVERY DAY FOR ANXIETY  . ergocalciferol (VITAMIN D2) 1.25 MG (50000 UT) capsule Take 1 capsule (50,000 Units total) by mouth once a week.  . febuxostat (ULORIC) 40 MG  tablet TAKE 1 TABLET BY MOUTH EVERY DAY  . fluticasone (FLONASE) 50 MCG/ACT nasal spray Place 2 sprays into both nostrils as needed for allergies or rhinitis.  . furosemide (LASIX) 20 MG tablet TAKE 1 TABLET BY MOUTH EVERY DAY AS NEEDED  . hydrALAZINE (APRESOLINE) 50 MG tablet TAKE 1 TABLET BY MOUTH 2 TIMES DAILY  . hydrOXYzine (ATARAX/VISTARIL) 10 MG tablet Take 10 mg by mouth 3 (three) times daily as needed.  . insulin aspart (NOVOLOG FLEXPEN) 100 UNIT/ML FlexPen Inject 16 Units into the skin 3 (three) times daily with meals. Max daily dose 70 units  . Insulin Glargine, 2 Unit Dial, (TOUJEO MAX SOLOSTAR) 300 UNIT/ML SOPN Inject 44 Units into the skin daily. Patient assistance program provides  . isosorbide mononitrate (IMDUR) 60 MG 24 hr tablet TAKE 1 TABLET BY MOUTH EVERY DAY  . loratadine (ALLERGY RELIEF) 10 MG tablet Take 10 mg by mouth daily as needed for allergies.  [name] meclizine (ANTIVERT) 25 MG tablet TAKE 1 TABLET BY MOUTH THREE TIMES DAILY AS NEEDED FOR DIZZINESS  . memantine (NAMENDA) 5 MG tablet TAKE 2 TABLETS BY MOUTH EVERY MORNING and TAKE 1 TABLET BY MOUTH EVERY EVENING FOR MEMORY  . metoprolol succinate (TOPROL-XL) 50 MG 24 hr tablet Take 50 mg by mouth daily.  . metoprolol tartrate (LOPRESSOR) 50 MG tablet  Take 50 mg by mouth daily.  . nitroGLYCERIN (NITROSTAT) 0.4 MG SL tablet Take one tablet under the tongue every 5 minutes as needed for chest pain  . oxyCODONE (OXY IR/ROXICODONE) 5 MG immediate release tablet Take 1 tablet (5 mg total) by mouth every 6 (six) hours as needed for moderate pain or severe pain.  . Pantoprazole Sodium (PROTONIX PO) Take 50 mg by mouth daily.  . pravastatin (PRAVACHOL) 20 MG tablet TAKE 1 TABLET BY MOUTH EVERY DAY FOR cholesterol  . senna-docusate (SENNA S) 8.6-50 MG tablet Take 2 tablets by mouth at bedtime.  . sodium polystyrene (KAYEXALATE) 15 GM/60ML suspension 15 gm by mouth weekly to keep potassium down.  . topiramate (TOPAMAX) 25 MG tablet TAKE  1 TABLET BY MOUTH DAILY  . XARELTO 10 MG TABS tablet TAKE 1 TABLET BY MOUTH EVERY DAY   No current facility-administered medications for this visit. (Other)      REVIEW OF SYSTEMS:    ALLERGIES Allergies  Allergen Reactions  . Sulfonamide Derivatives Swelling    Mouth swelling  . Aricept [Donepezil Hcl] Diarrhea and Nausea And Vomiting    GI upset/loose stools  . Tramadol Nausea And Vomiting    PAST MEDICAL HISTORY Past Medical History:  Diagnosis Date  . Allergy    takes Mucinex daily as needed  . Anemia, unspecified   . Anxiety    takes Clonazepam daily as needed  . Arthritis   . Cancer (HCC)    breast  . CHF (congestive heart failure) ([place])    takes Furosemide daily  . CKD (chronic kidney disease)   . Coronary artery disease    a. s/p IMI 2004 tx with BMS to RCA;  b. s/p Promus DES to RCA 2/12 c. 06/2014 High risk stress test follow up cath 06/2014 showd patent stent--< med therapy for other mild to moedrate disease   . Coronary atherosclerosis of native coronary artery   . Depression    takes Cymbalta daily  . Diabetes mellitus    insulin daily  . Dysuria   . GERD (gastroesophageal reflux disease)    takes Protonix daily  . Gout, unspecified    takes Colchicine daily  . Headache    occasionally  . History of blood clots about 28yrs ago   in legs-takes Coumadin   . Hyperlipidemia    takes Pravastatin daily  . Hypertension    takes Imdur and Metoprolol daily  . Insomnia   . Joint pain   . Joint swelling   . Myocardial infarction ([place]) 2004  . Numbness   . Obstructive sleep apnea    does not wear cpap  . Osteoarthritis   . Osteoarthritis   . Osteoarthrosis, unspecified whether generalized or localized, lower leg   . Pain, chronic   . Polymyalgia rheumatica ([place])   . Pulmonary emboli ([place]) 9/13   felt to need lifelong anticoagulation  . Shortness of breath dyspnea    with exertion and has Albuterol inhaler prn  . Type II or unspecified type  diabetes mellitus without mention of complication, uncontrolled   . Urinary incontinence    takes Linzess daily  . Vertigo    hx of;was taking Meclizine if needed  . Wears dentures    Past Surgical History:  Procedure Laterality Date  . ABDOMINAL HYSTERECTOMY     partial  . APPENDECTOMY    . blood clots/legs and lungs  2013  . BREAST BIOPSY Left 07/22/2014  . BREAST BIOPSY Left 02/10/2013  .  BREAST LUMPECTOMY Left 11/05/2014  . BREAST LUMPECTOMY WITH RADIOACTIVE SEED LOCALIZATION Left 11/05/2014   Procedure: LEFT BREAST LUMPECTOMY WITH RADIOACTIVE SEED LOCALIZATION;  Surgeon: [name], MD;  Location: [facility];  Service: General;  Laterality: Left;  . CARDIAC CATHETERIZATION    . COLONOSCOPY    . CORONARY ANGIOPLASTY  2  . ESOPHAGOGASTRODUODENOSCOPY (EGD) WITH PROPOFOL N/A 11/07/2016   Procedure: ESOPHAGOGASTRODUODENOSCOPY (EGD) WITH PROPOFOL;  Surgeon: Gatha Mayer, MD;  Location: [facility];  Service: Endoscopy;  Laterality: N/A;  . EXCISION OF SKIN TAG Right 11/05/2014   Procedure: EXCISION OF RIGHT EYELID SKIN TAG;  Surgeon: [name], MD;  Location: [facility];  Service: General;  Laterality: Right;  . EYE SURGERY Bilateral    cataract   . GASTRIC BYPASS  1977    reversed in 1979, [place] N/A 06/29/2014   Procedure: LEFT HEART CATHETERIZATION WITH CORONARY ANGIOGRAM;  Surgeon: Troy Sine, MD;  Location: [facility] CATH LAB;  Service: Cardiovascular;  Laterality: N/A;  . MEMBRANE PEEL Right 10/23/2018   Procedure: MEMBRANE PEEL;  Surgeon: Hurman Horn, MD;  Location: [facility];  Service: Ophthalmology;  Laterality: Right;  . MI with stent placement  2004  . PARS PLANA VITRECTOMY Right 10/23/2018   Procedure: PARS PLANA VITRECTOMY WITH 25 GAUGE;  Surgeon: Hurman Horn, MD;  Location: [facility];  Service: Ophthalmology;  Laterality: Right;    FAMILY HISTORY Family History  Problem Relation Age of Onset  . Breast cancer  Mother 58  . Heart disease Mother   . Throat cancer Father   . Hypertension Father   . Arthritis Father   . Diabetes Father   . Arthritis Sister   . Obesity Sister   . Diabetes Sister   . Heart disease Cousin   . Colon cancer Neg Hx   . Stomach cancer Neg Hx   . Esophageal cancer Neg Hx     SOCIAL HISTORY Social History   Tobacco Use  . Smoking status: Never Smoker  . Smokeless tobacco: Never Used  Substance Use Topics  . Alcohol use: No    Alcohol/week: 0.0 standard drinks  . Drug use: No         OPHTHALMIC EXAM:  Base Eye Exam    Visual Acuity (Snellen - Linear)      Right Left   Dist [place] 20/80+1 20/40-2   Dist ph [place] 20/70 20/30       Tonometry (Tonopen, 3:05 PM)      Right Left   Pressure 11 13       Pupils      Pupils Dark Light Shape React APD   Right PERRL 4 3 Round Slow None   Left PERRL 4 3 Round Slow None       Visual Fields (Counting fingers)      Left Right    Full Full       Extraocular Movement      Right Left    Full Full       Neuro/Psych    Oriented x3: Yes   Mood/Affect: Normal       Dilation    Both eyes: 1.0% Mydriacyl, 2.5% Phenylephrine @ 3:06 PM        Slit Lamp and Fundus Exam    External Exam      Right Left   External Normal Normal       Slit Lamp Exam  Right Left   Lids/Lashes Normal Normal   Conjunctiva/Sclera White and quiet White and quiet   Cornea Clear Clear   Anterior Chamber Deep and quiet Deep and quiet   Iris Round and reactive Round and reactive   Lens Posterior chamber intraocular lens Posterior chamber intraocular lens   Anterior Vitreous Normal Normal       Fundus Exam      Right Left   Posterior Vitreous Posterior vitreous detachment, Central vitreous floaters Posterior vitreous detachment, Central vitreous floaters   Disc Normal Normal   C/D Ratio 0.2 0.25   Macula Age related macular degeneration, Early age related macular degeneration, Atrophy, Retinal pigment epithelial atrophy,  no macular thickening, Microaneurysms Age related macular degeneration, Early age related macular degeneration, Atrophy, Retinal pigment epithelial atrophy, no macular thickening, Microaneurysms   Vessels NPDR- Moderate NPDR- Moderate   Periphery Normal Normal          IMAGING AND PROCEDURES  Imaging and Procedures for @TODAY @  OCT, Retina - OU - Both Eyes       Right Eye Quality was good. Scan locations included subfoveal. Central Foveal Thickness: 317. Progression has improved. Findings include abnormal foveal contour, central retinal atrophy, outer retinal atrophy.   Left Eye Scan locations included subfoveal. Central Foveal Thickness: 187. Progression has been stable. Findings include abnormal foveal contour, inner retinal atrophy, outer retinal atrophy, vitreomacular adhesion .   Notes OD with no active maculopathy.  In fact the minor thickening nasal to the fovea is still stable.  OS incidental vitreal macular adhesion is noted.  Some features of age-related macular degeneration are noted.  No active maculopathy.                ASSESSMENT/PLAN:  Moderate nonproliferative diabetic retinopathy of both eyes without macular edema associated with type 2 diabetes mellitus (HCC) The nature of moderate nonproliferative diabetic retinopathy was discussed with the patient as well as the need for more frequent follow up to judge for progression. Good blood glucose, blood pressure, and serum lipid control was recommended as well as avoidance of smoking and maintenance of normal weight.  Close follow up with PCP encouraged.  Intermediate stage nonexudative age-related macular degeneration of both eyes The nature of dry age related macular degeneration was discussed with the patient as well as its possible conversion to wet. The results of the AREDS 2 study was discussed with the patient. A diet rich in dark leafy green vegetables was advised and specific recommendations were made  regarding supplements with AREDS 2 formulation . Control of hypertension and serum cholesterol may slow the disease. Smoking cessation is mandatory to slow the disease and diminish the risk of progressing to wet age related macular degeneration. The patient was instructed in the use of an [place] and was told to return immediately for any changes in the Grid. Stressed to the patient do not rub eyes      ICD-10-CM   1. Intermediate stage nonexudative age-related macular degeneration of both eyes  H35.3132 OCT, Retina - OU - Both Eyes  2. Right epiretinal membrane  H35.371 OCT, Retina - OU - Both Eyes  3. Vitreomacular adhesion of right eye  H43.821 OCT, Retina - OU - Both Eyes  4. Moderate nonproliferative diabetic retinopathy of both eyes without macular edema associated with type 2 diabetes mellitus ([place])  Z61.0960     1.  2.  3.  Ophthalmic Meds Ordered this visit:  No orders of the defined types were placed  in this encounter.      Return in about 6 months (around 02/26/2020) for DILATE OU, OCT.  Patient Instructions  Age-Related Macular Degeneration  Age-related macular degeneration (AMD) is an eye disease related to aging. The disease causes a loss of central vision. Central vision allows a person to see objects clearly and do daily tasks like reading and driving. There are two main types of AMD:  Dry AMD. People with this type generally lose their vision slowly. This is the most common type of AMD. Some people with dry AMD notice very little change in their vision as they age.  Wet AMD. People with this type can lose their vision quickly. What are the causes? This condition is caused by damage to the part of the eye that provides you with central vision (macula).  Dry AMD happens when deposits in the macula cause light-sensitive cells to slowly break down.  Wet AMD happens when abnormal blood vessels grow under the macula and leak blood and fluid. What increases the  risk? You are more likely to develop this condition if you:  Are 16 years old or older, and especially 45 years old or older.  Smoke.  Are obese.  Have a family history of AMD.  Have high cholesterol, high blood pressure, or heart disease.  Have been exposed to high levels of ultraviolet (UV) light and blue light.  Are white (Caucasian).  Are female. What are the signs or symptoms? Common symptoms of this condition include:  Blurred vision, especially when reading print material. The blurred vision often improves in brighter light.  A blurred or blind spot in the center of your field of vision that is small but growing larger.  Bright colors seeming less bright than they used to be.  Decreased ability to recognize and see faces.  One eye seeing worse than the other.  Decreased ability to adapt to dimly lit rooms.  Straight lines appearing crooked or wavy. How is this diagnosed? This condition is diagnosed based on your symptoms and an eye exam. During the eye exam:  Eye drops will be placed into your eyes to enlarge (dilate) your pupils. This will allow your health care provider to see the back of your eye.  You may be asked to look at an image that looks like a checkerboard (Amsler grid). Early changes in your central vision may cause the grid to appear distorted. After the exam, you may be given one or both of these tests:  Fluorescein angiogram. This test determines whether you have dry or wet AMD.  Optical coherence tomography (OCT) test to evaluate deep layers of the retina. How is this treated? There is no cure for this condition, but treatment can help to slow down progression of the disease. This condition may be treated with:  Supplements, including vitamin C, vitamin E, beta carotene, and zinc.  Laser surgery to destroy new blood vessels or leaking blood vessels in your eye.  Injections of medicines into your eye to slow down the formation of abnormal  blood vessels that may leak. These injections may need to be repeated on a routine basis. Follow these instructions at home:  Take over-the-counter and prescription medicines only as told by your health care provider.  Take vitamins and supplements as told by your health care provider.  Ask your health care provider for an Amsler grid. Use it every day to check each eye for vision changes.  Get an eye exam as often as told by  your health care provider. Make sure to get an eye exam at least once every year.  Keep all follow-up visits as told by your health care provider. This is important. Contact a health care provider if:  You notice any new changes in your vision. Get help right away if:  You suddenly lose vision or develop pain in the eye. Summary  Age-related macular degeneration (AMD) is an eye disease related to aging. There are two types of this condition: dry AMD and wet AMD.  This condition is caused by damage to the part of the eye that provides you with central vision (macula).  Once diagnosed with AMD, make sure to get an eye exam every year, take supplements and vitamins as directed, use an Amsler grid at home, and follow up with your health care provider. This information is not intended to replace advice given to you by your health care provider. Make sure you discuss any questions you have with your health care provider. Document Revised: 10/31/2017 Document Reviewed: 10/31/2017 Elsevier Patient Education  [place] the diagnoses, plan, and follow up with the patient and they expressed understanding.  Patient expressed understanding of the importance of proper follow up care.   [name] Nethaniel Mattie M.D. Diseases & Surgery of the Retina and Vitreous Retina & Diabetic [place] @TODAY @     Abbreviations: M myopia (nearsighted); A astigmatism; H hyperopia (farsighted); P presbyopia; Mrx spectacle prescription;  CTL contact lenses; OD right  eye; OS left eye; OU both eyes  XT exotropia; ET esotropia; PEK punctate epithelial keratitis; PEE punctate epithelial erosions; DES dry eye syndrome; MGD meibomian gland dysfunction; ATs artificial tears; PFAT's preservative free artificial tears; [place] nuclear sclerotic cataract; PSC posterior subcapsular cataract; ERM epi-retinal membrane; PVD posterior vitreous detachment; RD retinal detachment; DM diabetes mellitus; DR diabetic retinopathy; NPDR non-proliferative diabetic retinopathy; PDR proliferative diabetic retinopathy; CSME clinically significant macular edema; DME diabetic macular edema; dbh dot blot hemorrhages; CWS cotton wool spot; POAG primary open angle glaucoma; C/D cup-to-disc ratio; HVF humphrey visual field; GVF goldmann visual field; OCT optical coherence tomography; IOP intraocular pressure; BRVO Branch retinal vein occlusion; CRVO central retinal vein occlusion; CRAO central retinal artery occlusion; BRAO branch retinal artery occlusion; RT retinal tear; SB scleral buckle; PPV pars plana vitrectomy; VH Vitreous hemorrhage; PRP panretinal laser photocoagulation; IVK intravitreal kenalog; VMT vitreomacular traction; MH Macular hole;  NVD neovascularization of the disc; NVE neovascularization elsewhere; AREDS age related eye disease study; ARMD age related macular degeneration; POAG primary open angle glaucoma; EBMD epithelial/anterior basement membrane dystrophy; ACIOL anterior chamber intraocular lens; IOL intraocular lens; PCIOL posterior chamber intraocular lens; Phaco/IOL phacoemulsification with intraocular lens placement; [place] photorefractive keratectomy; LASIK laser assisted in situ keratomileusis; HTN hypertension; DM diabetes mellitus; COPD chronic obstructive pulmonary disease

## 2019-08-27 NOTE — Assessment & Plan Note (Signed)
The nature of moderate nonproliferative diabetic retinopathy was discussed with the patient as well as the need for more frequent follow up to judge for progression. Good blood glucose, blood pressure, and serum lipid control was recommended as well as avoidance of smoking and maintenance of normal weight.  Close follow up with PCP encouraged. °

## 2019-08-27 NOTE — Patient Instructions (Signed)
Age-Related Macular Degeneration  Age-related macular degeneration (AMD) is an eye disease related to aging. The disease causes a loss of central vision. Central vision allows a person to see objects clearly and do daily tasks like reading and driving. There are two main types of AMD:  Dry AMD. People with this type generally lose their vision slowly. This is the most common type of AMD. Some people with dry AMD notice very little change in their vision as they age.  Wet AMD. People with this type can lose their vision quickly. What are the causes? This condition is caused by damage to the part of the eye that provides you with central vision (macula).  Dry AMD happens when deposits in the macula cause light-sensitive cells to slowly break down.  Wet AMD happens when abnormal blood vessels grow under the macula and leak blood and fluid. What increases the risk? You are more likely to develop this condition if you:  Are 50 years old or older, and especially 75 years old or older.  Smoke.  Are obese.  Have a family history of AMD.  Have high cholesterol, high blood pressure, or heart disease.  Have been exposed to high levels of ultraviolet (UV) light and blue light.  Are white (Caucasian).  Are female. What are the signs or symptoms? Common symptoms of this condition include:  Blurred vision, especially when reading print material. The blurred vision often improves in brighter light.  A blurred or blind spot in the center of your field of vision that is small but growing larger.  Bright colors seeming less bright than they used to be.  Decreased ability to recognize and see faces.  One eye seeing worse than the other.  Decreased ability to adapt to dimly lit rooms.  Straight lines appearing crooked or wavy. How is this diagnosed? This condition is diagnosed based on your symptoms and an eye exam. During the eye exam:  Eye drops will be placed into your eyes to  enlarge (dilate) your pupils. This will allow your health care provider to see the back of your eye.  You may be asked to look at an image that looks like a checkerboard (Amsler grid). Early changes in your central vision may cause the grid to appear distorted. After the exam, you may be given one or both of these tests:  Fluorescein angiogram. This test determines whether you have dry or wet AMD.  Optical coherence tomography (OCT) test to evaluate deep layers of the retina. How is this treated? There is no cure for this condition, but treatment can help to slow down progression of the disease. This condition may be treated with:  Supplements, including vitamin C, vitamin E, beta carotene, and zinc.  Laser surgery to destroy new blood vessels or leaking blood vessels in your eye.  Injections of medicines into your eye to slow down the formation of abnormal blood vessels that may leak. These injections may need to be repeated on a routine basis. Follow these instructions at home:  Take over-the-counter and prescription medicines only as told by your health care provider.  Take vitamins and supplements as told by your health care provider.  Ask your health care provider for an Amsler grid. Use it every day to check each eye for vision changes.  Get an eye exam as often as told by your health care provider. Make sure to get an eye exam at least once every year.  Keep all follow-up visits as told by   your health care provider. This is important. Contact a health care provider if:  You notice any new changes in your vision. Get help right away if:  You suddenly lose vision or develop pain in the eye. Summary  Age-related macular degeneration (AMD) is an eye disease related to aging. There are two types of this condition: dry AMD and wet AMD.  This condition is caused by damage to the part of the eye that provides you with central vision (macula).  Once diagnosed with AMD, make sure  to get an eye exam every year, take supplements and vitamins as directed, use an Amsler grid at home, and follow up with your health care provider. This information is not intended to replace advice given to you by your health care provider. Make sure you discuss any questions you have with your health care provider. Document Revised: 10/31/2017 Document Reviewed: 10/31/2017 Elsevier Patient Education  2020 [employer]  

## 2019-08-27 NOTE — Assessment & Plan Note (Signed)
The nature of dry age related macular degeneration was discussed with the patient as well as its possible conversion to wet. The results of the AREDS 2 study was discussed with the patient. A diet rich in dark leafy green vegetables was advised and specific recommendations were made regarding supplements with AREDS 2 formulation . Control of hypertension and serum cholesterol may slow the disease. Smoking cessation is mandatory to slow the disease and diminish the risk of progressing to wet age related macular degeneration. The patient was instructed in the use of an [place] Grid and was told to return immediately for any changes in the Grid. Stressed to the patient do not rub eyes 

## 2019-09-08 ENCOUNTER — Ambulatory Visit (INDEPENDENT_AMBULATORY_CARE_PROVIDER_SITE_OTHER): Payer: PPO

## 2019-09-08 ENCOUNTER — Ambulatory Visit
Admission: EM | Admit: 2019-09-08 | Discharge: 2019-09-08 | Disposition: A | Discharge status: Home / Self Care | Payer: PPO | Attending: Emergency Medicine | Admitting: Emergency Medicine

## 2019-09-08 DIAGNOSIS — J40 Bronchitis, not specified as acute or chronic: Secondary | ICD-10-CM | POA: Diagnosis not present

## 2019-09-08 DIAGNOSIS — R05 Cough: Secondary | ICD-10-CM

## 2019-09-08 DIAGNOSIS — Z7689 Persons encountering health services in other specified circumstances: Secondary | ICD-10-CM | POA: Diagnosis not present

## 2019-09-08 LAB — POC SARS CORONAVIRUS 2 AG -  ED: SARS Coronavirus 2 Ag: NEGATIVE

## 2019-09-08 MED ORDER — DOXYCYCLINE HYCLATE 100 MG PO CAPS: 100.0000 mg | ORAL_CAPSULE | Freq: Two times a day (BID) | ORAL | 0 refills | Status: AC

## 2019-09-08 NOTE — ED Triage Notes (Signed)
Pt c/o cough, bilateral ear pressure, headache, and dizziness since Saturday. States has loss of taste. States taken Advil, musunex and meds for vertigo with no relief.

## 2019-09-08 NOTE — ED Provider Notes (Signed)
[name]    CSN: [id number] Arrival date & time: 09/08/19  1201      History   Chief Complaint Chief Complaint  Patient presents with  . Cough    HPI [name] is a 85 y.o. female with extensive medical history as outlined below including diabetes, hypertension, obesity, CAD, CHF, allergies presenting for URI symptoms since Saturday.  She is accompanied by her daughter who provides history: Endorsing cough, ear pressure, headache, dizziness, loss of taste.  Dizziness described as feeling off, tired.  Has had some nausea, though no tinnitus, no change in hearing, falls.  Has taken ibuprofen, Mucinex with minimal relief.  Denies lower leg swelling, shortness of breath, chest pain, palpitations, fever.  No known sick contacts.   Past Medical History:  Diagnosis Date  . Allergy    takes Mucinex daily as needed  . Anemia, unspecified   . Anxiety    takes Clonazepam daily as needed  . Arthritis   . Cancer (HCC)    breast  . CHF (congestive heart failure) ([place])    takes Furosemide daily  . CKD (chronic kidney disease)   . Coronary artery disease    a. s/p IMI 2004 tx with BMS to RCA;  b. s/p Promus DES to RCA 2/12 c. 06/2014 High risk stress test follow up cath 06/2014 showd patent stent--< med therapy for other mild to moedrate disease   . Coronary atherosclerosis of native coronary artery   . Depression    takes Cymbalta daily  . Diabetes mellitus    insulin daily  . Dysuria   . GERD (gastroesophageal reflux disease)    takes Protonix daily  . Gout, unspecified    takes Colchicine daily  . Headache    occasionally  . History of blood clots about 42yrs ago   in legs-takes Coumadin   . Hyperlipidemia    takes Pravastatin daily  . Hypertension    takes Imdur and Metoprolol daily  . Insomnia   . Joint pain   . Joint swelling   . Myocardial infarction ([place]) 2004  . Numbness   . Obstructive sleep apnea    does not wear cpap  . Osteoarthritis   .  Osteoarthritis   . Osteoarthrosis, unspecified whether generalized or localized, lower leg   . Pain, chronic   . Polymyalgia rheumatica ([place])   . Pulmonary emboli ([place]) 9/13   felt to need lifelong anticoagulation  . Shortness of breath dyspnea    with exertion and has Albuterol inhaler prn  . Type II or unspecified type diabetes mellitus without mention of complication, uncontrolled   . Urinary incontinence    takes Linzess daily  . Vertigo    hx of;was taking Meclizine if needed  . Wears dentures     Patient Active Problem List   Diagnosis Date Noted  . Follow-up examination after eye surgery 08/27/2019  . Right epiretinal membrane 08/27/2019  . Vitreomacular adhesion of right eye 08/27/2019  . Diabetes mellitus without complication ([place]) 57/84/6962  . Intermediate stage nonexudative age-related macular degeneration of both eyes 08/27/2019  . Moderate nonproliferative diabetic retinopathy of both eyes without macular edema associated with type 2 diabetes mellitus ([place]) 08/27/2019  . Pulmonary embolus ([place]) 07/15/2019  . Type 2 diabetes mellitus with hyperglycemia, with long-term current use of insulin ([place]) 03/25/2019  . Pneumonia due to COVID-19 virus 01/25/2019  . Type 2 diabetes mellitus with diabetic neuropathy, with long-term current use of insulin ([place]) 12/13/2017  .  CHF (congestive heart failure) ([place]) 10/08/2017  . Hyperkalemia 07/26/2017  . Anxiety associated with depression 06/08/2017  . GERD with esophagitis 11/15/2016  . Memory loss 11/15/2016  . Multiple benign nevi 11/15/2016  . Esophageal dysphagia 11/07/2016  . Severe episode of recurrent major depressive disorder, without psychotic features ([place]) 06/23/2016  . Endometrial polyp 06/23/2016  . Vaginitis and vulvovaginitis 06/23/2016  . Cough variant asthma 11/17/2015  . Vitamin D deficiency 08/02/2015  . Breast cancer ([place]) 04/26/2015  . Dyslipidemia associated with type 2 diabetes mellitus ([place]) 02/06/2015    . Type 2 diabetes mellitus with stage 3b chronic kidney disease, with long-term current use of insulin ([employer]) 02/06/2015  . Chronic pain syndrome 02/06/2015  . Morbid obesity with BMI of 50.0-59.9, adult ([place]) 12/18/2014  . CKD (chronic kidney disease) stage 3, GFR 30-59 ml/min (HCC) 12/18/2014  . Gout 10/27/2014  . Breast mass 10/27/2014  . Encounter for therapeutic drug monitoring 10/08/2014  . Abnormal stress test 09/27/2014  . Vertigo 09/24/2014  . Chronic bronchitis ([place]) 09/23/2014  . Preoperative clearance 08/16/2014  . DOE (dyspnea on exertion) 06/24/2014  . Postmenopausal bleeding 06/04/2014  . CN (constipation) 05/10/2014  . History of pulmonary embolus (PE) 04/22/2014  . COPD (chronic obstructive pulmonary disease) ([place]) 04/21/2014  . Estrogen deficiency 02/19/2014  . Breast cancer screening 02/19/2014  . Weight gain 02/19/2014  . Pain in the chest 12/31/2013  . Type 2 diabetes mellitus with diabetic foot deformity ([place]) 07/29/2013  . OSA (obstructive sleep apnea) 02/24/2013  . Need for prophylactic vaccination and inoculation against influenza 01/08/2013  . Insomnia 09/17/2012  . Debility 08/01/2012  . Osteoarthritis 08/01/2012  . Dysphagia, unspecified(787.20) 07/31/2012  . GERD (gastroesophageal reflux disease) 07/31/2012  . Long term current use of anticoagulant therapy 02/02/2012  . Chronic diastolic congestive heart failure ([place]) 01/29/2012  . History of deep venous thrombosis 01/27/2012  . Abdominal pain 01/26/2012  . Cholelithiasis 06/06/2010  . Situational depression 06/04/2009  . ORTHOPNEA 03/09/2009  . Morbid (severe) obesity due to excess calories ([place]) 02/15/2009  . Microcytic anemia 02/15/2009  . TENSION HEADACHE 01/07/2008  . Headache 01/07/2008  . Fungal dermatitis 11/13/2007  . CAD (coronary artery disease) 08/29/2007  . URINARY INCONTINENCE 08/29/2007  . Hyperlipidemia 12/03/2006  . Essential hypertension 12/03/2006  . Personal history of  other diseases of the digestive system 12/03/2006    Past Surgical History:  Procedure Laterality Date  . ABDOMINAL HYSTERECTOMY     partial  . APPENDECTOMY    . blood clots/legs and lungs  2013  . BREAST BIOPSY Left 07/22/2014  . BREAST BIOPSY Left 02/10/2013  . BREAST LUMPECTOMY Left 11/05/2014  . BREAST LUMPECTOMY WITH RADIOACTIVE SEED LOCALIZATION Left 11/05/2014   Procedure: LEFT BREAST LUMPECTOMY WITH RADIOACTIVE SEED LOCALIZATION;  Surgeon: [name], MD;  Location: [facility];  Service: General;  Laterality: Left;  . CARDIAC CATHETERIZATION    . COLONOSCOPY    . CORONARY ANGIOPLASTY  2  . ESOPHAGOGASTRODUODENOSCOPY (EGD) WITH PROPOFOL N/A 11/07/2016   Procedure: ESOPHAGOGASTRODUODENOSCOPY (EGD) WITH PROPOFOL;  Surgeon: Gatha Mayer, MD;  Location: [facility];  Service: Endoscopy;  Laterality: N/A;  . EXCISION OF SKIN TAG Right 11/05/2014   Procedure: EXCISION OF RIGHT EYELID SKIN TAG;  Surgeon: [name], MD;  Location: [facility];  Service: General;  Laterality: Right;  . EYE SURGERY Bilateral    cataract   . GASTRIC BYPASS  1977    reversed in 1979, [place]  N/A 06/29/2014   Procedure: LEFT HEART CATHETERIZATION WITH CORONARY ANGIOGRAM;  Surgeon: Troy Sine, MD;  Location: [facility] C[facility] LAB;  Service: Cardiovascular;  Laterality: N/A;  . MEMBRANE PEEL Right 10/23/2018   Procedure: MEMBRANE PEEL;  Surgeon: Hurman Horn, MD;  Location: [facility];  Service: Ophthalmology;  Laterality: Right;  . MI with stent placement  2004  . PARS PLANA VITRECTOMY Right 10/23/2018   Procedure: PARS PLANA VITRECTOMY WITH 25 GAUGE;  Surgeon: Hurman Horn, MD;  Location: [facility];  Service: Ophthalmology;  Laterality: Right;    OB History   No obstetric history on file.      Home Medications    Prior to Admission medications   Medication Sig Start Date End Date Taking? Authorizing Provider  albuterol (PROAIR HFA) 108 (90 Base)  MCG/ACT inhaler Inhale 1-2 puffs into the lungs every 6 (six) hours as needed for wheezing or shortness of breath. 12/19/18   [name], NP  anastrozole (ARIMIDEX) 1 MG tablet TAKE 1 TABLET BY MOUTH EVERY DAY 03/18/19   [name], MD  budesonide-formoterol [facility]) 160-4.5 MCG/ACT inhaler Inhale 2 puffs into the lungs 2 (two) times daily. For bronchitis 09/03/18   [name], NP  cholecalciferol (VITAMIN D3) 25 MCG (1000 UNIT) tablet Take 1,000 Units by mouth daily.    [provider]  Continuous Blood Gluc Sensor (FREESTYLE LIBRE 14 DAY SENSOR) MISC 1 Units by Does not apply route 4 (four) times daily -  before meals and at bedtime. For diabetes 12/10/18   [name], NP  doxycycline (VIBRAMYCIN) 100 MG capsule Take 1 capsule (100 mg total) by mouth 2 (two) times daily for 7 days. 09/08/19 09/15/19  Hall-Potvin, [name], PA-C  DULoxetine (CYMBALTA) 60 MG capsule TAKE 1 CAPSULE BY MOUTH EVERY DAY FOR ANXIETY 03/17/19   [name], NP  ergocalciferol (VITAMIN D2) 1.25 MG (50000 UT) capsule Take 1 capsule (50,000 Units total) by mouth once a week. 07/28/19   [name], MD  febuxostat (ULORIC) 40 MG tablet TAKE 1 TABLET BY MOUTH EVERY DAY 03/17/19   [name], NP  fluticasone (FLONASE) 50 MCG/ACT nasal spray Place 2 sprays into both nostrils as needed for allergies or rhinitis.    [provider]  furosemide (LASIX) 20 MG tablet TAKE 1 TABLET BY MOUTH EVERY DAY AS NEEDED 03/17/19   [name], NP  hydrALAZINE (APRESOLINE) 50 MG tablet TAKE 1 TABLET BY MOUTH 2 TIMES DAILY 08/21/19   [name], NP  hydrOXYzine (ATARAX/VISTARIL) 10 MG tablet Take 10 mg by mouth 3 (three) times daily as needed. 07/18/19   [provider]  insulin aspart (NOVOLOG FLEXPEN) 100 UNIT/ML FlexPen Inject 16 Units into the skin 3 (three) times daily with meals. Max daily dose 70 units 04/14/19   Shamleffer, [name], MD  Insulin  Glargine, 2 Unit Dial, (TOUJEO MAX SOLOSTAR) 300 UNIT/ML SOPN Inject 44 Units into the skin daily. Patient assistance program provides 06/11/19   Shamleffer, [name], MD  isosorbide mononitrate (IMDUR) 60 MG 24 hr tablet TAKE 1 TABLET BY MOUTH EVERY DAY 03/17/19   [name], NP  loratadine (ALLERGY RELIEF) 10 MG tablet Take 10 mg by mouth daily as needed for allergies.    [provider]  meclizine (ANTIVERT) 25 MG tablet TAKE 1 TABLET BY MOUTH THREE TIMES DAILY AS NEEDED FOR DIZZINESS 04/28/19   [name], NP  memantine (NAMENDA) 5 MG tablet TAKE 2  TABLETS BY MOUTH EVERY MORNING and TAKE 1 TABLET BY MOUTH EVERY EVENING FOR MEMORY 02/17/19   [name], NP  metoprolol succinate (TOPROL-XL) 50 MG 24 hr tablet Take 50 mg by mouth daily. 07/18/19   [provider]  metoprolol tartrate (LOPRESSOR) 50 MG tablet Take 50 mg by mouth daily.    [provider]  nitroGLYCERIN (NITROSTAT) 0.4 MG SL tablet Take one tablet under the tongue every 5 minutes as needed for chest pain 09/24/13   [name], NP  oxyCODONE (OXY IR/ROXICODONE) 5 MG immediate release tablet Take 1 tablet (5 mg total) by mouth every 6 (six) hours as needed for moderate pain or severe pain. 07/24/19   [name], MD  Pantoprazole Sodium (PROTONIX PO) Take 50 mg by mouth daily.    [provider]  pravastatin (PRAVACHOL) 20 MG tablet TAKE 1 TABLET BY MOUTH EVERY DAY FOR cholesterol 03/17/19   [name], NP  senna-docusate (SENNA S) 8.6-50 MG tablet Take 2 tablets by mouth at bedtime. 07/24/19   [name], MD  sodium polystyrene (KAYEXALATE) 15 GM/60ML suspension 15 gm by mouth weekly to keep potassium down. 10/11/18   [name], NP  topiramate (TOPAMAX) 25 MG tablet TAKE 1 TABLET BY MOUTH DAILY 03/24/19   [name], NP  XARELTO 10 MG TABS tablet TAKE 1 TABLET BY MOUTH EVERY DAY 05/12/19   [name], NP    Family  History Family History  Problem Relation Age of Onset  . Breast cancer Mother 26  . Heart disease Mother   . Throat cancer Father   . Hypertension Father   . Arthritis Father   . Diabetes Father   . Arthritis Sister   . Obesity Sister   . Diabetes Sister   . Heart disease Cousin   . Colon cancer Neg Hx   . Stomach cancer Neg Hx   . Esophageal cancer Neg Hx     Social History Social History   Tobacco Use  . Smoking status: Never Smoker  . Smokeless tobacco: Never Used  Substance Use Topics  . Alcohol use: No    Alcohol/week: 0.0 standard drinks  . Drug use: No     Allergies   Sulfonamide derivatives, Aricept [donepezil hcl], and Tramadol   Review of Systems As per HPI   Physical Exam Triage Vital Signs ED Triage Vitals  Enc Vitals Group     BP      Pulse      Resp      Temp      Temp src      SpO2      Weight      Height      Head Circumference      Peak Flow      Pain Score      Pain Loc      Pain Edu?      Excl. in [place]?    No data found.  Updated Vital Signs BP (!) 140/92 (BP Location: Right Arm)   Pulse 70   Temp 98.1 F (36.7 C) (Oral)   Resp 16   SpO2 98%   Visual Acuity Right Eye Distance:   Left Eye Distance:   Bilateral Distance:    Right Eye Near:   Left Eye Near:    Bilateral Near:     Physical Exam Vitals reviewed.  Constitutional:      General: She is not in acute distress.  Appearance: She is obese. She is not ill-appearing.  HENT:     Head: Normocephalic and atraumatic.     Right Ear: Tympanic membrane, ear canal and external ear normal.     Left Ear: Tympanic membrane, ear canal and external ear normal.     Mouth/Throat:     Mouth: Mucous membranes are moist.     Pharynx: Oropharynx is clear.  Eyes:     General: No scleral icterus.       Right eye: No discharge.        Left eye: No discharge.     Extraocular Movements: Extraocular movements intact.     Pupils: Pupils are equal, round, and reactive to light.   Cardiovascular:     Rate and Rhythm: Normal rate and regular rhythm.     Pulses: Normal pulses.     Heart sounds: No murmur.  Pulmonary:     Effort: Pulmonary effort is normal. No respiratory distress.     Breath sounds: No wheezing.     Comments: Decreased air movement bilaterally. Chest:     Chest wall: No tenderness.  Abdominal:     General: Abdomen is flat.     Palpations: Abdomen is soft.     Tenderness: There is no abdominal tenderness. There is no guarding.  Musculoskeletal:     Cervical back: Normal range of motion and neck supple. No muscular tenderness.  Lymphadenopathy:     Cervical: No cervical adenopathy.  Skin:    General: Skin is warm.     Capillary Refill: Capillary refill takes less than 2 seconds.     Coloration: Skin is not jaundiced or pale.     Findings: No rash.  Neurological:     General: No focal deficit present.     Mental Status: She is alert and oriented to person, place, and time.  Psychiatric:        Mood and Affect: Mood normal.        Thought Content: Thought content normal.      UC Treatments / Results  Labs (all labs ordered are listed, but only abnormal results are displayed) Labs Reviewed  POC SARS CORONAVIRUS 2 AG -  ED - Normal  NOVEL CORONAVIRUS, NAA    EKG   Radiology DG Chest 2 View  Result Date: 09/08/2019 CLINICAL DATA:  Cough EXAM: CHEST - 2 VIEW COMPARISON:  January 24, 2019 FINDINGS: Lungs are clear. Heart size and pulmonary vascularity are within normal limits. No adenopathy. There are foci of coronary artery calcification as well as foci of aortic atherosclerosis. There is degenerative change in the thoracic spine. IMPRESSION: Lungs clear. Heart size within normal limits. There are foci of coronary artery calcification. Aortic Atherosclerosis (ICD10-I70.0). Electronically Signed   By: Lowella Grip III M.D.   On: 09/08/2019 14:03    Procedures Procedures (including critical care time)  Medications Ordered in  UC Medications - No data to display  Initial Impression / Assessment and Plan / UC Course  I have reviewed the triage vital signs and the nursing notes.  Pertinent labs & imaging results that were available during my care of the patient were reviewed by me and considered in my medical decision making (see chart for details).     Patient afebrile, nontoxic in office today.  Rapid Covid negative, PCR pending.  Chest x-ray done office, reviewed by me and radiology.  Compared to previous from 01/24/2019: Lungs are clear with stable heart size.  Foci of coronary artery calcification,  aortic atherosclerosis.  Reviewed findings with patient daughter who verbalized understanding.  Patient later admits to cough ongoing for 2-3 weeks.  We will treat for bronchitis as outlined below.  Encouraged patient to use home albuterol and keep follow-up with PCP this Friday.  Return precautions discussed, patient and daughter verbalized understanding and are agreeable to plan. Final Clinical Impressions(s) / UC Diagnoses   Final diagnoses:  Bronchitis     Discharge Instructions     Take antibiotic twice daily with food. Use inhaler as needed for difficulty breathing, chest tightness, wheezing. Schedule I week follow-up with your regular doctor. Go to ER in the meantime for worsening cough, you develop chest pain, vomiting, severe abdominal pain, weakness.    ED Prescriptions    Medication Sig Dispense Auth. Provider   doxycycline (VIBRAMYCIN) 100 MG capsule Take 1 capsule (100 mg total) by mouth 2 (two) times daily for 7 days. 14 capsule Hall-Potvin, [name], PA-C     PDMP not reviewed this encounter.   Hall-Potvin, [name], [place] 09/08/19 1515

## 2019-09-08 NOTE — Discharge Instructions (Signed)
Take antibiotic twice daily with food. Use inhaler as needed for difficulty breathing, chest tightness, wheezing. Schedule I week follow-up with your regular doctor. Go to ER in the meantime for worsening cough, you develop chest pain, vomiting, severe abdominal pain, weakness.

## 2019-09-09 LAB — SARS-COV-2, NAA 2 DAY TAT

## 2019-09-09 LAB — NOVEL CORONAVIRUS, NAA: SARS-CoV-2, NAA: NOT DETECTED

## 2019-09-12 ENCOUNTER — Encounter: Payer: Self-pay | Admitting: Nurse Practitioner

## 2019-09-12 ENCOUNTER — Other Ambulatory Visit: Payer: Self-pay

## 2019-09-12 ENCOUNTER — Ambulatory Visit (INDEPENDENT_AMBULATORY_CARE_PROVIDER_SITE_OTHER): Payer: PPO | Admitting: Nurse Practitioner

## 2019-09-12 VITALS — BP 132/78 | HR 69 | Temp 96.8°F | Ht 67.0 in | Wt 320.0 lb

## 2019-09-12 DIAGNOSIS — R0981 Nasal congestion: Secondary | ICD-10-CM | POA: Diagnosis not present

## 2019-09-12 DIAGNOSIS — G894 Chronic pain syndrome: Secondary | ICD-10-CM | POA: Diagnosis not present

## 2019-09-12 DIAGNOSIS — E1142 Type 2 diabetes mellitus with diabetic polyneuropathy: Secondary | ICD-10-CM | POA: Diagnosis not present

## 2019-09-12 DIAGNOSIS — Z794 Long term (current) use of insulin: Secondary | ICD-10-CM

## 2019-09-12 DIAGNOSIS — I10 Essential (primary) hypertension: Secondary | ICD-10-CM

## 2019-09-12 DIAGNOSIS — E1140 Type 2 diabetes mellitus with diabetic neuropathy, unspecified: Secondary | ICD-10-CM

## 2019-09-12 DIAGNOSIS — G629 Polyneuropathy, unspecified: Secondary | ICD-10-CM

## 2019-09-12 DIAGNOSIS — R42 Dizziness and giddiness: Secondary | ICD-10-CM

## 2019-09-12 DIAGNOSIS — L299 Pruritus, unspecified: Secondary | ICD-10-CM

## 2019-09-12 DIAGNOSIS — J302 Other seasonal allergic rhinitis: Secondary | ICD-10-CM

## 2019-09-12 MED ORDER — MECLIZINE HCL 25 MG PO TABS: ORAL_TABLET | ORAL | 1 refills | Status: DC

## 2019-09-12 MED ORDER — PREGABALIN 25 MG PO CAPS: 25.0000 mg | ORAL_CAPSULE | Freq: Two times a day (BID) | ORAL | 3 refills | Status: DC

## 2019-09-12 MED ORDER — HYDROXYZINE HCL 10 MG PO TABS: 10.0000 mg | ORAL_TABLET | Freq: Three times a day (TID) | ORAL | 0 refills | Status: DC | PRN

## 2019-09-12 MED ORDER — FLUTICASONE PROPIONATE 50 MCG/ACT NA SUSP: 1.0000 | Freq: Two times a day (BID) | NASAL | 3 refills | Status: DC

## 2019-09-12 NOTE — Progress Notes (Signed)
Careteam: Patient Care Team: [name], NP as PCP - General (Geriatric Medicine) [name], [name], MD (Inactive) as Consulting Physician (Cardiology) [name], MD as Consulting Physician (Ophthalmology) [name], MD as Consulting Physician (General Surgery) [name], MD (Inactive) as Referring Physician (Obstetrics and Gynecology) [name] [name], MD as Consulting Physician (Ophthalmology) [facility], [name], MD as Consulting Physician (Endocrinology) [name], MD as Consulting Physician (Hematology)  PLACE OF SERVICE:  [facility]  Advanced Directive information    Allergies  Allergen Reactions  . Sulfonamide Derivatives Swelling    Mouth swelling  . Aricept [Donepezil Hcl] Diarrhea and Nausea And Vomiting    GI upset/loose stools  . Tramadol Nausea And Vomiting    Chief Complaint  Patient presents with  . Medical Management of Chronic Issues    4 month follow-up   . Headache    Headache, constant   . Ear Problem    Ear's feel clogged   . Sleeping Problem    Trouble staying asleep      HPI: Patient is a 85 y.o. female for routine follow up.   Neck pain- reports pain has improved so did not go get xray. But still having numbness to hands and feet.   She had fall a few months ago, had xray but no abnormalities found.   DM- fasting blood sugar 80-120s, evening blood sugar high 290-300s, following with endocrinologist go help get blood sugars controlled.   Morbid obesity- trying to cut back on calories and move more. Getting up once daily and walks.  Recently went to urgent care due to bronchitis, now on doxycycline. Cough and congestion better. Overall feeling better. Used inhaler yesterday once.   CKD- continues nephrologist.   Hx of DVT/PE- no signs of recurrence, continues on low dose xarelto.   Reports she is constantly having noise in her head also with more frequent headache. Hearing of sounds water in her  ears. Taking clairtin 10 mg daily  Review of Systems:  Review of Systems  Constitutional: Positive for malaise/fatigue. Negative for chills, fever and weight loss.  HENT: Positive for congestion, ear pain, hearing loss and sinus pain. Negative for sore throat and tinnitus.   Respiratory: Negative for cough, sputum production and shortness of breath.   Cardiovascular: Negative for chest pain, palpitations and leg swelling.  Gastrointestinal: Negative for abdominal pain, constipation, diarrhea and heartburn.  Genitourinary: Negative for dysuria, frequency and urgency.  Musculoskeletal: Positive for joint pain and myalgias. Negative for back pain and falls.  Skin: Negative.   Neurological: Positive for tingling, weakness and headaches. Negative for dizziness.  Psychiatric/Behavioral: Negative for depression and memory loss. The patient does not have insomnia.     Past Medical History:  Diagnosis Date  . Allergy    takes Mucinex daily as needed  . Anemia, unspecified   . Anxiety    takes Clonazepam daily as needed  . Arthritis   . Cancer (HCC)    breast  . CHF (congestive heart failure) ([place])    takes Furosemide daily  . CKD (chronic kidney disease)   . Coronary artery disease    a. s/p IMI 2004 tx with BMS to RCA;  b. s/p Promus DES to RCA 2/12 c. 06/2014 High risk stress test follow up cath 06/2014 showd patent stent--< med therapy for other mild to moedrate disease   . Coronary atherosclerosis of native coronary artery   . Depression    takes Cymbalta daily  . Diabetes  mellitus    insulin daily  . Dysuria   . GERD (gastroesophageal reflux disease)    takes Protonix daily  . Gout, unspecified    takes Colchicine daily  . Headache    occasionally  . History of blood clots about 71yrs ago   in legs-takes Coumadin   . Hyperlipidemia    takes Pravastatin daily  . Hypertension    takes Imdur and Metoprolol daily  . Insomnia   . Joint pain   . Joint swelling   . Myocardial  infarction ([place]) 2004  . Numbness   . Obstructive sleep apnea    does not wear cpap  . Osteoarthritis   . Osteoarthritis   . Osteoarthrosis, unspecified whether generalized or localized, lower leg   . Pain, chronic   . Polymyalgia rheumatica ([place])   . Pulmonary emboli ([place]) 9/13   felt to need lifelong anticoagulation  . Shortness of breath dyspnea    with exertion and has Albuterol inhaler prn  . Type II or unspecified type diabetes mellitus without mention of complication, uncontrolled   . Urinary incontinence    takes Linzess daily  . Vertigo    hx of;was taking Meclizine if needed  . Wears dentures    Past Surgical History:  Procedure Laterality Date  . ABDOMINAL HYSTERECTOMY     partial  . APPENDECTOMY    . blood clots/legs and lungs  2013  . BREAST BIOPSY Left 07/22/2014  . BREAST BIOPSY Left 02/10/2013  . BREAST LUMPECTOMY Left 11/05/2014  . BREAST LUMPECTOMY WITH RADIOACTIVE SEED LOCALIZATION Left 11/05/2014   Procedure: LEFT BREAST LUMPECTOMY WITH RADIOACTIVE SEED LOCALIZATION;  Surgeon: [name], MD;  Location: [facility];  Service: General;  Laterality: Left;  . CARDIAC CATHETERIZATION    . COLONOSCOPY    . CORONARY ANGIOPLASTY  2  . ESOPHAGOGASTRODUODENOSCOPY (EGD) WITH PROPOFOL N/A 11/07/2016   Procedure: ESOPHAGOGASTRODUODENOSCOPY (EGD) WITH PROPOFOL;  Surgeon: Gatha Mayer, MD;  Location: [facility];  Service: Endoscopy;  Laterality: N/A;  . EXCISION OF SKIN TAG Right 11/05/2014   Procedure: EXCISION OF RIGHT EYELID SKIN TAG;  Surgeon: [name], MD;  Location: [facility];  Service: General;  Laterality: Right;  . EYE SURGERY Bilateral    cataract   . GASTRIC BYPASS  1977    reversed in 1979, [place] N/A 06/29/2014   Procedure: LEFT HEART CATHETERIZATION WITH CORONARY ANGIOGRAM;  Surgeon: Troy Sine, MD;  Location: [facility] CATH LAB;  Service: Cardiovascular;  Laterality: N/A;  . MEMBRANE PEEL  Right 10/23/2018   Procedure: MEMBRANE PEEL;  Surgeon: Hurman Horn, MD;  Location: [facility];  Service: Ophthalmology;  Laterality: Right;  . MI with stent placement  2004  . PARS PLANA VITRECTOMY Right 10/23/2018   Procedure: PARS PLANA VITRECTOMY WITH 25 GAUGE;  Surgeon: Hurman Horn, MD;  Location: [facility];  Service: Ophthalmology;  Laterality: Right;   Social History:   reports that she has never smoked. She has never used smokeless tobacco. She reports that she does not drink alcohol or use drugs.  Family History  Problem Relation Age of Onset  . Breast cancer Mother 41  . Heart disease Mother   . Throat cancer Father   . Hypertension Father   . Arthritis Father   . Diabetes Father   . Arthritis Sister   . Obesity Sister   . Diabetes Sister   . Heart disease Cousin   . Colon cancer  Neg Hx   . Stomach cancer Neg Hx   . Esophageal cancer Neg Hx     Medications: Patient's Medications  New Prescriptions   No medications on file  Previous Medications   ALBUTEROL (PROAIR HFA) 108 (90 BASE) MCG/ACT INHALER    Inhale 1-2 puffs into the lungs every 6 (six) hours as needed for wheezing or shortness of breath.   ANASTROZOLE (ARIMIDEX) 1 MG TABLET    TAKE 1 TABLET BY MOUTH EVERY DAY   BUDESONIDE-FORMOTEROL (SYMBICORT) 160-4.5 MCG/ACT INHALER    Inhale 2 puffs into the lungs 2 (two) times daily. For bronchitis   CHOLECALCIFEROL (VITAMIN D3) 25 MCG (1000 UNIT) TABLET    Take 1,000 Units by mouth daily.   CONTINUOUS BLOOD GLUC SENSOR (FREESTYLE LIBRE 14 DAY SENSOR) MISC    1 Units by Does not apply route 4 (four) times daily -  before meals and at bedtime. For diabetes   DOXYCYCLINE (VIBRAMYCIN) 100 MG CAPSULE    Take 1 capsule (100 mg total) by mouth 2 (two) times daily for 7 days.   DULOXETINE (CYMBALTA) 60 MG CAPSULE    TAKE 1 CAPSULE BY MOUTH EVERY DAY FOR ANXIETY   ERGOCALCIFEROL (VITAMIN D2) 1.25 MG (50000 UT) CAPSULE    Take 1 capsule (50,000 Units total) by mouth once a week.    FEBUXOSTAT (ULORIC) 40 MG TABLET    TAKE 1 TABLET BY MOUTH EVERY DAY   FLUTICASONE (FLONASE) 50 MCG/ACT NASAL SPRAY    Place 2 sprays into both nostrils as needed for allergies or rhinitis.   FUROSEMIDE (LASIX) 20 MG TABLET    TAKE 1 TABLET BY MOUTH EVERY DAY AS NEEDED   HYDRALAZINE (APRESOLINE) 50 MG TABLET    TAKE 1 TABLET BY MOUTH 2 TIMES DAILY   HYDROXYZINE (ATARAX/VISTARIL) 10 MG TABLET    Take 10 mg by mouth 3 (three) times daily as needed.   INSULIN ASPART (NOVOLOG FLEXPEN) 100 UNIT/ML FLEXPEN    Inject 16 Units into the skin 3 (three) times daily with meals. Max daily dose 70 units   INSULIN GLARGINE, 2 UNIT DIAL, (TOUJEO MAX SOLOSTAR) 300 UNIT/ML SOPN    Inject 44 Units into the skin daily. Patient assistance program provides   ISOSORBIDE MONONITRATE (IMDUR) 60 MG 24 HR TABLET    TAKE 1 TABLET BY MOUTH EVERY DAY   LORATADINE (ALLERGY RELIEF) 10 MG TABLET    Take 10 mg by mouth daily as needed for allergies.   MECLIZINE (ANTIVERT) 25 MG TABLET    TAKE 1 TABLET BY MOUTH THREE TIMES DAILY AS NEEDED FOR DIZZINESS   MEMANTINE (NAMENDA) 5 MG TABLET    TAKE 2 TABLETS BY MOUTH EVERY MORNING and TAKE 1 TABLET BY MOUTH EVERY EVENING FOR MEMORY   METOPROLOL SUCCINATE (TOPROL-XL) 50 MG 24 HR TABLET    Take 50 mg by mouth daily.   METOPROLOL TARTRATE (LOPRESSOR) 50 MG TABLET    Take 50 mg by mouth daily.   NITROGLYCERIN (NITROSTAT) 0.4 MG SL TABLET    Take one tablet under the tongue every 5 minutes as needed for chest pain   OXYCODONE (OXY IR/ROXICODONE) 5 MG IMMEDIATE RELEASE TABLET    Take 1 tablet (5 mg total) by mouth every 6 (six) hours as needed for moderate pain or severe pain.   PANTOPRAZOLE SODIUM (PROTONIX PO)    Take 50 mg by mouth daily.   PRAVASTATIN (PRAVACHOL) 20 MG TABLET    TAKE 1 TABLET BY MOUTH EVERY DAY FOR cholesterol  SENNA-DOCUSATE (SENNA S) 8.6-50 MG TABLET    Take 2 tablets by mouth at bedtime.   SODIUM POLYSTYRENE (KAYEXALATE) 15 GM/60ML SUSPENSION    15 gm by mouth weekly  to keep potassium down.   TOPIRAMATE (TOPAMAX) 25 MG TABLET    TAKE 1 TABLET BY MOUTH DAILY   XARELTO 10 MG TABS TABLET    TAKE 1 TABLET BY MOUTH EVERY DAY  Modified Medications   No medications on file  Discontinued Medications   No medications on file    Physical Exam:  Vitals:   09/12/19 1313  BP: 132/78  Pulse: 69  Temp: (!) 96.8 F (36 C)  TempSrc: Temporal  SpO2: 99%  Weight: (!) 320 lb (145.2 kg)  Height: 5\' 7"  (1.702 m)   Body mass index is 50.12 kg/m. Wt Readings from Last 3 Encounters:  07/29/19 (!) 323 lb (146.5 kg)  07/24/19 (!) 322 lb (146.1 kg)  06/03/19 (!) 325 lb (147.4 kg)    Physical Exam Constitutional:      General: She is not in acute distress.    Appearance: She is well-developed. She is obese. She is not diaphoretic.  HENT:     Head: Normocephalic and atraumatic.     Right Ear: External ear normal. A middle ear effusion is present. Tympanic membrane is not erythematous.     Left Ear: External ear normal. A middle ear effusion is present. Tympanic membrane is not erythematous.     Mouth/Throat:     Mouth: Mucous membranes are moist.     Pharynx: Oropharynx is clear. No oropharyngeal exudate.  Eyes:     Extraocular Movements:     Right eye: No nystagmus.     Left eye: No nystagmus.     Conjunctiva/sclera: Conjunctivae normal.     Pupils: Pupils are equal, round, and reactive to light.  Cardiovascular:     Rate and Rhythm: Normal rate and regular rhythm.     Heart sounds: Normal heart sounds.  Pulmonary:     Effort: Pulmonary effort is normal.     Breath sounds: Normal breath sounds.  Abdominal:     General: Bowel sounds are normal.     Palpations: Abdomen is soft.  Musculoskeletal:        General: No tenderness.     Cervical back: Normal range of motion and neck supple.  Skin:    General: Skin is warm and dry.  Neurological:     Mental Status: She is alert and oriented to person, place, and time.     Labs reviewed: Basic  Metabolic Panel: Recent Labs    01/30/19 0240 02/12/19 1129 07/24/19 1438  NA 134* 140 137  K 4.9 4.7 4.6  CL 102 106 106  CO2 24 28 23   GLUCOSE 246* 218* 294*  BUN 52* 31* 35*  CREATININE 1.31* 1.52* 1.64*  CALCIUM 8.9 8.9 8.9   Liver Function Tests: Recent Labs    01/29/19 0310 01/29/19 0310 01/30/19 0240 02/12/19 1129 07/24/19 1438  AST 18   < > 17 18 9*  ALT 14   < > 16 16 6   ALKPHOS 63  --  61  --  92  BILITOT 0.2*   < > 0.3 0.5 0.3  PROT 7.6   < > 7.2 6.6 7.6  ALBUMIN 3.0*  --  2.8*  --  2.8*   < > = values in this interval not displayed.   No results for input(s): LIPASE, AMYLASE in the last 8760 hours.  No results for input(s): AMMONIA in the last 8760 hours. CBC: Recent Labs    01/30/19 0240 02/12/19 1129 07/24/19 1438  WBC 11.1* 7.2 12.1*  NEUTROABS 8.1* 4,176 8.7*  HGB 8.9* 9.1* 9.4*  HCT 27.5* 28.0* 31.2*  MCV 91.1 90.0 89.9  PLT 315 200 278   Lipid Panel: No results for input(s): CHOL, HDL, LDLCALC, TRIG, CHOLHDL, LDLDIRECT in the last 8760 hours. TSH: No results for input(s): TSH in the last 8760 hours. A1C: Lab Results  Component Value Date   HGBA1C 9.8 (A) 03/24/2019     Assessment/Plan 1. Dizziness -ongoing, suspect dizziness likely due to increase congestion due to allergies, may need ENT evaluation.   - meclizine (ANTIVERT) 25 MG tablet; TAKE 1 TABLET BY MOUTH THREE TIMES DAILY AS NEEDED FOR DIZZINESS  Dispense: 90 tablet; Refill: 1   2. Diabetic polyneuropathy associated with type 2 diabetes mellitus ([place]) Not controlled based on last A1c, she has been managed by endocrine due to variable blood sugar, she does not eat consistently which has been discussed and education provided to pt and family.  Not on ace or arb due to CKD with hyperkalemia  -ongoing neuropathy- she did not take her lyric as prescribed at last OV, plans to get refill to see if this benefits.   3. Morbid (severe) obesity due to excess calories ([place]) Ongoing,  discussed need for weight loss with dietary modifications.   4. Chronic pain syndrome Ongoing, reports she does not wish to take oxycodone which was prescribed by Dr Irene Limbo due to risk of side effects. Also educated today about risk of adverse drug reaction.   5. Type 2 diabetes mellitus with diabetic neuropathy, with long-term current use of insulin ([place]) Ongoing follow up with endocrinologist, discussed starting lyrica to help with neuropathy in legs.   6. Essential hypertension Controlled on current regimen  7. Neuropathy - pregabalin (LYRICA) 25 MG capsule; Take 1 capsule (25 mg total) by mouth 2 (two) times daily.  Dispense: 60 capsule; Refill: 3 - NCV with EMG(electromyography); Future to evaluate neuropathy in hands, decrease sensation but good grip strength.  -also with neck pain and encouraged imaging to be completed which has already been ordered.  8. Pruritus Ongoing, has been using vistaril PRN - hydrOXYzine (ATARAX/VISTARIL) 10 MG tablet; Take 1 tablet (10 mg total) by mouth 3 (three) times daily as needed.  Dispense: 90 tablet; Refill: 0  9. Sinus congestion/ Seasonal allergies -with headache, dizziness and sinus congestion and decrease hearing, watery eyes, sneezing. Taking antihistamine routinely but will add flonase which she has done in the past. If she continues with symptoms will get ENT referral.  - fluticasone (FLONASE) 50 MCG/ACT nasal spray; Place 1 spray into both nostrils in the morning and at bedtime.  Dispense: 11.1 mL; Refill: 3  Next appt: 3 months (fasting blood work at appt), sooner if needed  [employer]. [place], [facility] [phone]

## 2019-09-12 NOTE — Patient Instructions (Signed)
Follow up in 3 months-- will get fasting blood work at appt  To start flonase 1 spray into both nares twice daily  To start lyrica 25 mg by mouth twice daily for neuropathy  Get xray of neck and nerve conduction study has been ordered

## 2019-09-17 DIAGNOSIS — I129 Hypertensive chronic kidney disease with stage 1 through stage 4 chronic kidney disease, or unspecified chronic kidney disease: Secondary | ICD-10-CM | POA: Diagnosis not present

## 2019-09-17 DIAGNOSIS — N2581 Secondary hyperparathyroidism of renal origin: Secondary | ICD-10-CM | POA: Diagnosis not present

## 2019-09-17 DIAGNOSIS — E875 Hyperkalemia: Secondary | ICD-10-CM | POA: Diagnosis not present

## 2019-09-17 DIAGNOSIS — E1122 Type 2 diabetes mellitus with diabetic chronic kidney disease: Secondary | ICD-10-CM | POA: Diagnosis not present

## 2019-09-17 DIAGNOSIS — N183 Chronic kidney disease, stage 3 unspecified: Secondary | ICD-10-CM | POA: Diagnosis not present

## 2019-09-17 DIAGNOSIS — D631 Anemia in chronic kidney disease: Secondary | ICD-10-CM | POA: Diagnosis not present

## 2019-09-17 DIAGNOSIS — I5030 Unspecified diastolic (congestive) heart failure: Secondary | ICD-10-CM | POA: Diagnosis not present

## 2019-09-17 DIAGNOSIS — N189 Chronic kidney disease, unspecified: Secondary | ICD-10-CM | POA: Diagnosis not present

## 2019-10-09 ENCOUNTER — Other Ambulatory Visit: Payer: Self-pay | Admitting: Nurse Practitioner

## 2019-10-09 DIAGNOSIS — R413 Other amnesia: Secondary | ICD-10-CM

## 2019-10-17 DIAGNOSIS — L602 Onychogryphosis: Secondary | ICD-10-CM | POA: Diagnosis not present

## 2019-10-22 ENCOUNTER — Other Ambulatory Visit: Payer: Self-pay | Admitting: Hematology

## 2019-10-22 ENCOUNTER — Telehealth: Payer: Self-pay

## 2019-10-22 ENCOUNTER — Other Ambulatory Visit: Payer: Self-pay | Admitting: Nurse Practitioner

## 2019-10-22 DIAGNOSIS — G8929 Other chronic pain: Secondary | ICD-10-CM

## 2019-10-22 MED ORDER — DULOXETINE HCL 60 MG PO CPEP: ORAL_CAPSULE | ORAL | 1 refills | Status: DC

## 2019-10-22 MED ORDER — TOPIRAMATE 25 MG PO TABS: 25.0000 mg | ORAL_TABLET | Freq: Every day | ORAL | 1 refills | Status: DC

## 2019-10-22 MED ORDER — ISOSORBIDE MONONITRATE ER 60 MG PO TB24: ORAL_TABLET | ORAL | 1 refills | Status: DC

## 2019-10-22 NOTE — Telephone Encounter (Signed)
Fax received for medication refill.

## 2019-10-27 ENCOUNTER — Ambulatory Visit: Payer: PPO | Admitting: Internal Medicine

## 2019-10-28 ENCOUNTER — Telehealth: Payer: Self-pay | Admitting: *Deleted

## 2019-10-28 NOTE — Telephone Encounter (Signed)
Contacted by daughter. She is cancelling mother's appt for tomorrow 6/23 because her mother does not feel well. She stated she will contact office to make another appointment at a later date. Appts for 6/23 cancelled

## 2019-10-29 ENCOUNTER — Inpatient Hospital Stay: Payer: PPO

## 2019-10-29 ENCOUNTER — Inpatient Hospital Stay: Payer: PPO | Admitting: Hematology

## 2019-11-17 ENCOUNTER — Other Ambulatory Visit: Payer: Self-pay | Admitting: Nurse Practitioner

## 2019-11-17 DIAGNOSIS — E875 Hyperkalemia: Secondary | ICD-10-CM

## 2019-11-26 ENCOUNTER — Other Ambulatory Visit: Payer: Self-pay | Admitting: Nurse Practitioner

## 2019-11-26 DIAGNOSIS — L299 Pruritus, unspecified: Secondary | ICD-10-CM

## 2019-12-08 ENCOUNTER — Other Ambulatory Visit: Payer: Self-pay | Admitting: Family

## 2019-12-08 ENCOUNTER — Other Ambulatory Visit: Payer: Self-pay | Admitting: Nurse Practitioner

## 2019-12-12 ENCOUNTER — Ambulatory Visit: Payer: PPO | Admitting: Nurse Practitioner

## 2019-12-17 ENCOUNTER — Encounter: Payer: Self-pay | Admitting: Nurse Practitioner

## 2019-12-17 ENCOUNTER — Telehealth (INDEPENDENT_AMBULATORY_CARE_PROVIDER_SITE_OTHER): Payer: PPO | Admitting: Nurse Practitioner

## 2019-12-17 ENCOUNTER — Other Ambulatory Visit: Payer: Self-pay

## 2019-12-17 ENCOUNTER — Other Ambulatory Visit: Payer: Self-pay | Admitting: Nurse Practitioner

## 2019-12-17 DIAGNOSIS — F322 Major depressive disorder, single episode, severe without psychotic features: Secondary | ICD-10-CM

## 2019-12-17 DIAGNOSIS — Z1231 Encounter for screening mammogram for malignant neoplasm of breast: Secondary | ICD-10-CM

## 2019-12-17 DIAGNOSIS — R413 Other amnesia: Secondary | ICD-10-CM

## 2019-12-17 DIAGNOSIS — I5032 Chronic diastolic (congestive) heart failure: Secondary | ICD-10-CM

## 2019-12-17 DIAGNOSIS — G894 Chronic pain syndrome: Secondary | ICD-10-CM

## 2019-12-17 DIAGNOSIS — E1140 Type 2 diabetes mellitus with diabetic neuropathy, unspecified: Secondary | ICD-10-CM | POA: Diagnosis not present

## 2019-12-17 DIAGNOSIS — G8929 Other chronic pain: Secondary | ICD-10-CM

## 2019-12-17 DIAGNOSIS — G629 Polyneuropathy, unspecified: Secondary | ICD-10-CM

## 2019-12-17 DIAGNOSIS — G4700 Insomnia, unspecified: Secondary | ICD-10-CM

## 2019-12-17 DIAGNOSIS — N1832 Chronic kidney disease, stage 3b: Secondary | ICD-10-CM | POA: Diagnosis not present

## 2019-12-17 DIAGNOSIS — E1169 Type 2 diabetes mellitus with other specified complication: Secondary | ICD-10-CM

## 2019-12-17 DIAGNOSIS — E785 Hyperlipidemia, unspecified: Secondary | ICD-10-CM

## 2019-12-17 DIAGNOSIS — I10 Essential (primary) hypertension: Secondary | ICD-10-CM | POA: Diagnosis not present

## 2019-12-17 DIAGNOSIS — Z794 Long term (current) use of insulin: Secondary | ICD-10-CM

## 2019-12-17 DIAGNOSIS — R519 Headache, unspecified: Secondary | ICD-10-CM | POA: Diagnosis not present

## 2019-12-17 DIAGNOSIS — E1142 Type 2 diabetes mellitus with diabetic polyneuropathy: Secondary | ICD-10-CM

## 2019-12-17 NOTE — Progress Notes (Signed)
This service is provided via telemedicine  No vital signs collected/recorded due to the encounter was a telemedicine visit.   Location of patient (ex: home, work):  Home   Patient consents to a telephone visit:  Yes, see telephone encounter dated 12/17/2019 with annual consent   Location of the provider (ex: office, home):  [facility], Office   Name of any referring provider:  N/A  Names of all persons participating in the telemedicine service and their role in the encounter:  S.Chrae B/CMA, [name], NP, [name] (daughter), and Patient   Time spent on call:  9 min with medical assistant      Careteam: Patient Care Team: [name], NP as PCP - General (Geriatric Medicine) [name], [name], MD (Inactive) as Consulting Physician (Cardiology) [name], MD as Consulting Physician (Ophthalmology) [name], MD as Consulting Physician (General Surgery) [name], MD (Inactive) as Referring Physician (Obstetrics and Gynecology) [name] [name], MD as Consulting Physician (Ophthalmology) [facility], [name], MD as Consulting Physician (Endocrinology) [name], MD as Consulting Physician (Hematology)  PLACE OF SERVICE:  [place] Directive information Does Patient Have a Medical Advance Directive?: No, Would patient like information on creating a medical advance directive?: Yes (MAU/Ambulatory/Procedural Areas - Information given)  Allergies  Allergen Reactions  . Sulfonamide Derivatives Swelling    Mouth swelling  . Aricept [Donepezil Hcl] Diarrhea and Nausea And Vomiting    GI upset/loose stools  . Tramadol Nausea And Vomiting    Chief Complaint  Patient presents with  . Medical Management of Chronic Issues    3 month follow-up   . Quality Metric Gaps    Discuss need for A1c, foot exam, and mammogram   . Immunizations    Discuss need for TD  . Sleeping Problem    Patient c/o trouble  falling and staying asleep      HPI: Patient is a 85 y.o. female for routine follow up  DM- has not seen endocrinologist since Feb, unsure why she has not followed up.  Reports blood sugars in the 100s. Unsure what the avg blood sugars  Overdue for foot exam- previously went to podiatry but does not wish to do this at this time.   Reports she has ongoing foot pain due to deformity of toes. Reports ongoing neuropathy.   On lyrica which reports it helps. - helps hands a lot  CKD- cancelled last nephrology appt. Not going on dialysis if she gets worse. Feels like she does fine.  HTN/CHF- does not check blood pressure at home. No increase in swelling, chest pain or shortness of breath.   Hx of breath cancer- she is not up to date on mammogram.   Having a hard time sleeping. Does not do caffeine. Does not takes nap throughout the day.  Stays in the bed 24/7.    Review of Systems:  Review of Systems  Constitutional: Negative for chills, fever and weight loss.  HENT: Negative for hearing loss.   Respiratory: Negative for cough, sputum production and shortness of breath.   Cardiovascular: Negative for chest pain, palpitations and leg swelling.  Gastrointestinal: Negative for abdominal pain, constipation, diarrhea and heartburn.  Genitourinary: Negative for dysuria, frequency and urgency.  Musculoskeletal: Positive for back pain and joint pain. Negative for falls and myalgias.  Skin: Negative.   Neurological: Positive for tingling, sensory change and headaches. Negative for dizziness and weakness.  Psychiatric/Behavioral: Positive for depression. Negative for memory  loss. The patient has insomnia.    Past Medical History:  Diagnosis Date  . Allergy    takes Mucinex daily as needed  . Anemia, unspecified   . Anxiety    takes Clonazepam daily as needed  . Arthritis   . Cancer (HCC)    breast  . CHF (congestive heart failure) ([place])    takes Furosemide daily  . CKD (chronic  kidney disease)   . Coronary artery disease    a. s/p IMI 2004 tx with BMS to RCA;  b. s/p Promus DES to RCA 2/12 c. 06/2014 High risk stress test follow up cath 06/2014 showd patent stent--< med therapy for other mild to moedrate disease   . Coronary atherosclerosis of native coronary artery   . Depression    takes Cymbalta daily  . Diabetes mellitus    insulin daily  . Dysuria   . GERD (gastroesophageal reflux disease)    takes Protonix daily  . Gout, unspecified    takes Colchicine daily  . Headache    occasionally  . History of blood clots about 11yr ago   in legs-takes Coumadin   . Hyperlipidemia    takes Pravastatin daily  . Hypertension    takes Imdur and Metoprolol daily  . Insomnia   . Joint pain   . Joint swelling   . Myocardial infarction ([place] 2004  . Numbness   . Obstructive sleep apnea    does not wear cpap  . Osteoarthritis   . Osteoarthritis   . Osteoarthrosis, unspecified whether generalized or localized, lower leg   . Pain, chronic   . Polymyalgia rheumatica ([place]   . Pulmonary emboli ([place] 9/13   felt to need lifelong anticoagulation  . Shortness of breath dyspnea    with exertion and has Albuterol inhaler prn  . Type II or unspecified type diabetes mellitus without mention of complication, uncontrolled   . Urinary incontinence    takes Linzess daily  . Vertigo    hx of;was taking Meclizine if needed  . Wears dentures    Past Surgical History:  Procedure Laterality Date  . ABDOMINAL HYSTERECTOMY     partial  . APPENDECTOMY    . blood clots/legs and lungs  2013  . BREAST BIOPSY Left 07/22/2014  . BREAST BIOPSY Left 02/10/2013  . BREAST LUMPECTOMY Left 11/05/2014  . BREAST LUMPECTOMY WITH RADIOACTIVE SEED LOCALIZATION Left 11/05/2014   Procedure: LEFT BREAST LUMPECTOMY WITH RADIOACTIVE SEED LOCALIZATION;  Surgeon: DCoralie Keens MD;  [facility]  Service: General;  Laterality: Left;  . CARDIAC CATHETERIZATION    . COLONOSCOPY    .  CORONARY ANGIOPLASTY  2  . ESOPHAGOGASTRODUODENOSCOPY (EGD) WITH PROPOFOL N/A 11/07/2016   Procedure: ESOPHAGOGASTRODUODENOSCOPY (EGD) WITH PROPOFOL;  Surgeon: GGatha Mayer MD;  Location: [facility];  Service: Endoscopy;  Laterality: N/A;  . EXCISION OF SKIN TAG Right 11/05/2014   Procedure: EXCISION OF RIGHT EYELID SKIN TAG;  Surgeon: DCoralie Keens MD;  [place] [facility]  Service: General;  Laterality: Right;  . EYE SURGERY Bilateral    cataract   . GASTRIC BYPASS  1977    [place] in [place], [place] 06/29/2014   Procedure: LEFT HEART CATHETERIZATION WITH CORONARY ANGIOGRAM;  Surgeon: TTroy Sine MD;  [facility];  Service: Cardiovascular;  Laterality: N/A;  . MEMBRANE PEEL Right 10/23/2018   Procedure: MEMBRANE PEEL;  Surgeon: RHurman Horn MD;  Location: [facility]  Service: Ophthalmology;  Laterality: Right;  . MI with stent placement  2004  . PARS PLANA VITRECTOMY Right 10/23/2018   Procedure: PARS PLANA VITRECTOMY WITH 25 GAUGE;  Surgeon: Hurman Horn, MD;  Location: [place];  Service: Ophthalmology;  Laterality: Right;   Social History:   reports that she has never smoked. She has never used smokeless tobacco. She reports that she does not drink alcohol and does not use drugs.  Family History  Problem Relation Age of Onset  . Breast cancer Mother 51  . Heart disease Mother   . Throat cancer Father   . Hypertension Father   . Arthritis Father   . Diabetes Father   . Arthritis Sister   . Obesity Sister   . Diabetes Sister   . Heart disease Cousin   . Colon cancer Neg Hx   . Stomach cancer Neg Hx   . Esophageal cancer Neg Hx     Medications: Patient's Medications  New Prescriptions   No medications on file  Previous Medications   ALBUTEROL (PROAIR HFA) 108 (90 BASE) MCG/ACT INHALER    Inhale 1-2 puffs into the lungs every 6 (six) hours as needed for wheezing or shortness of breath.   ALLERGY  RELIEF 10 MG TABLET    TAKE 1 TABLET BY MOUTH EVERY DAY   ANASTROZOLE (ARIMIDEX) 1 MG TABLET    TAKE 1 TABLET BY MOUTH EVERY DAY   BUDESONIDE-FORMOTEROL (SYMBICORT) 160-4.5 MCG/ACT INHALER    Inhale 2 puffs into the lungs 2 (two) times daily. For bronchitis   CONTINUOUS BLOOD GLUC SENSOR (FREESTYLE LIBRE 14 DAY SENSOR) MISC    1 Units by Does not apply route 4 (four) times daily -  before meals and at bedtime. For diabetes   DULOXETINE (CYMBALTA) 60 MG CAPSULE    TAKE 1 CAPSULE BY MOUTH EVERY DAY FOR ANXIETY   ERGOCALCIFEROL (VITAMIN D2) 1.25 MG (50000 UT) CAPSULE    Take 1 capsule (50,000 Units total) by mouth once a week.   FEBUXOSTAT (ULORIC) 40 MG TABLET    TAKE 1 TABLET BY MOUTH EVERY DAY   FLUTICASONE (FLONASE) 50 MCG/ACT NASAL SPRAY    Place 1 spray into both nostrils in the morning and at bedtime.   FUROSEMIDE (LASIX) 20 MG TABLET    TAKE 1 TABLET BY MOUTH EVERY DAY AS NEEDED   HYDRALAZINE (APRESOLINE) 50 MG TABLET    TAKE 1 TABLET BY MOUTH 2 TIMES DAILY   HYDROXYZINE (ATARAX/VISTARIL) 10 MG TABLET    Take 1 tablet by mouth three times daily as needed   INSULIN ASPART (NOVOLOG FLEXPEN) 100 UNIT/ML FLEXPEN    Inject 16 Units into the skin 3 (three) times daily with meals. Max daily dose 70 units   INSULIN GLARGINE, 2 UNIT DIAL, (TOUJEO MAX SOLOSTAR) 300 UNIT/ML SOPN    Inject 44 Units into the skin daily. Patient assistance program provides   ISOSORBIDE MONONITRATE (IMDUR) 60 MG 24 HR TABLET    TAKE 1 TABLET BY MOUTH EVERY DAY   MECLIZINE (ANTIVERT) 25 MG TABLET    TAKE 1 TABLET BY MOUTH THREE TIMES DAILY AS NEEDED FOR DIZZINESS   MEMANTINE (NAMENDA) 5 MG TABLET    TAKE 2 TABLETS BY MOUTH EVERY MORNING and TAKE 1 TABLET EVERY EVENING FOR memory   METOPROLOL SUCCINATE (TOPROL-XL) 50 MG 24 HR TABLET    Take 50 mg by mouth daily.   NITROGLYCERIN (NITROSTAT) 0.4 MG SL TABLET    Take one tablet under the tongue  every 5 minutes as needed for chest pain   PANTOPRAZOLE SODIUM (PROTONIX PO)    Take  40 mg by mouth daily.    PRAVASTATIN (PRAVACHOL) 20 MG TABLET    TAKE 1 TABLET BY MOUTH EVERY DAY FOR cholesterol   PREGABALIN (LYRICA) 25 MG CAPSULE    Take 1 capsule (25 mg total) by mouth 2 (two) times daily.   SODIUM POLYSTYRENE (SPS) 15 GM/60ML SUSPENSION    TAKE 15 grams (60 mls) BY MOUTH ONCE WEEKLY TO keep potassium down   TOPIRAMATE (TOPAMAX) 25 MG TABLET    Take 1 tablet (25 mg total) by mouth daily.   XARELTO 10 MG TABS TABLET    TAKE 1 TABLET BY MOUTH EVERY DAY  Modified Medications   No medications on file  Discontinued Medications   CHOLECALCIFEROL (VITAMIN D3) 25 MCG (1000 UNIT) TABLET    Take 1,000 Units by mouth daily.   METOPROLOL TARTRATE (LOPRESSOR) 50 MG TABLET    Take 50 mg by mouth daily.   SENNA-DOCUSATE (SENNA S) 8.6-50 MG TABLET    Take 2 tablets by mouth at bedtime.    Physical Exam:  There were no vitals filed for this visit. There is no height or weight on file to calculate BMI. Wt Readings from Last 3 Encounters:  09/12/19 (!) 320 lb (145.2 kg)  07/29/19 (!) 323 lb (146.5 kg)  07/24/19 (!) 322 lb (146.1 kg)      Labs reviewed: Basic Metabolic Panel: Recent Labs    01/30/19 0240 02/12/19 1129 07/24/19 1438  NA 134* 140 137  K 4.9 4.7 4.6  CL 102 106 106  CO2 '24 28 23  ' GLUCOSE 246* 218* 294*  BUN 52* 31* 35*  CREATININE 1.31* 1.52* 1.64*  CALCIUM 8.9 8.9 8.9   Liver Function Tests: Recent Labs    01/29/19 0310 01/29/19 0310 01/30/19 0240 02/12/19 1129 07/24/19 1438  AST 18   < > 17 18 9*  ALT 14   < > '16 16 6  ' ALKPHOS 63  --  61  --  92  BILITOT 0.2*   < > 0.3 0.5 0.3  PROT 7.6   < > 7.2 6.6 7.6  ALBUMIN 3.0*  --  2.8*  --  2.8*   < > = values in this interval not displayed.   No results for input(s): LIPASE, AMYLASE in the last 8760 hours. No results for input(s): AMMONIA in the last 8760 hours. CBC: Recent Labs    01/30/19 0240 02/12/19 1129 07/24/19 1438  WBC 11.1* 7.2 12.1*  NEUTROABS 8.1* 4,176 8.7*  HGB 8.9* 9.1*  9.4*  HCT 27.5* 28.0* 31.2*  MCV 91.1 90.0 89.9  PLT 315 200 278   Lipid Panel: No results for input(s): CHOL, HDL, LDLCALC, TRIG, CHOLHDL, LDLDIRECT in the last 8760 hours. TSH: No results for input(s): TSH in the last 8760 hours. A1C: Lab Results  Component Value Date   HGBA1C 9.8 (A) 03/24/2019     Assessment/Plan 1. Type 2 diabetes mellitus with diabetic neuropathy, with long-term current use of insulin ([place]) -has not followed up with endocrinologist on diabetes and could not give me readings. Encouraged her to make appt with endocrinologist. No hypoglycemia noted.  -Encouraged dietary compliance, routine foot care/monitoring and to keep up with diabetic eye exams through ophthalmology  - Hemoglobin A1c; Future  2. Neuropathy -improved symptoms on lyrica. Unable to titrate up due to CKD.   3. Memory loss -ongoing. Daughter helps her with medications and ADLs.  4. Chronic diastolic congestive heart failure (HCC) Stable, without worsening shortness of breath, swelling or chest pains, continues on imdur, metoprolol and lasix.  - CBC with Differential/Platelet; Future - CMP with eGFR([facility]); Future  5. Insomnia, unspecified type Ongoing, encouraged her to get out of bed more, establish a routine. Can use melatonin 3 mg to help with sleep  6. Screening mammogram, encounter for - MM DIGITAL SCREENING BILATERAL; Future  7. Diabetic polyneuropathy associated with type 2 diabetes mellitus ([place]) -ongoing, lyrica has helped symptoms.  8. Chronic pain syndrome -ongoing, lyrica has helped. Also can use tylenol 500 mg 1-2 tablet every 8 hours PNR  9. Chronic nonintractable headache, unspecified headache type Ongoing, she is not sure if she has been taking her topamax.   10. Depression, major, single episode, severe ([place]) Ongoing but stable. Continues on cymbalta, also encourage counseling.   11. Stage 3b chronic kidney disease -Encourage proper hydration and to avoid NSAIDS  (Aleve, Advil, Motrin, Ibuprofen). Will follow up lab.   12. Essential hypertension Has not check blood pressure recently. Encouraged to check bp at home. Goal <140/90. Continue current medication as prescribed and low sodium diet.  - CBC with Differential/Platelet; Future - CMP with eGFR([facility]); Future  13. Hyperlipidemia associated with type 2 diabetes mellitus ([place]) Due for follow up lipids, continues on pravastatin 20 mg daily with dietary modifications. - CMP with eGFR([facility]); Future - Lipid Panel; Future  Next appt:  30month in office also to come in when able to fasting labs.  JCarlos American EHarle Battiest PK[facility] [phone]   Virtual Visit via Video Note  I connected with EGeannie Risenon 12/18/19 at  1:00 PM EDT by a video enabled telemedicine application and verified that I am speaking with the correct person using two identifiers.  Location: Patient: car Provider: office   I discussed the limitations of evaluation and management by telemedicine and the availability of in person appointments. The patient expressed understanding and agreed to proceed.    I discussed the assessment and treatment plan with the patient. The patient was provided an opportunity to ask questions and all were answered. The patient agreed with the plan and demonstrated an understanding of the instructions.   The patient was advised to call back or seek an in-person evaluation if the symptoms worsen or if the condition fails to improve as anticipated.  I provided 25 minutes of non-face-to-face time during this encounter.  JCarlos American EDewaine Oats AGNP Avs printed and mailed.

## 2019-12-18 ENCOUNTER — Other Ambulatory Visit: Payer: Self-pay | Admitting: *Deleted

## 2019-12-18 MED ORDER — HYDRALAZINE HCL 50 MG PO TABS: 50.0000 mg | ORAL_TABLET | Freq: Two times a day (BID) | ORAL | 3 refills | Status: DC

## 2019-12-18 NOTE — Telephone Encounter (Signed)
Refill Request from pharmacy.

## 2019-12-22 ENCOUNTER — Telehealth: Payer: Self-pay | Admitting: Internal Medicine

## 2019-12-22 MED ORDER — TOUJEO MAX SOLOSTAR 300 UNIT/ML ~~LOC~~ SOPN: 44.0000 [IU] | PEN_INJECTOR | Freq: Every day | SUBCUTANEOUS | 6 refills | Status: DC

## 2019-12-22 NOTE — Telephone Encounter (Signed)
Would you like pt to make f/u appt before issuing refills?

## 2019-12-22 NOTE — Telephone Encounter (Signed)
Medication Refill Request  Did you call your pharmacy and request this refill first? Yes-Patient states she ran out of the medication listed below due to [employer] put for patient to inject 40 units instead of the 44 units patient injects. . If patient has not contacted pharmacy first, instruct them to do so for future refills.  . Remind them that contacting the pharmacy for their refill is the quickest method to get the refill.  . Refill policy also stated that it will take anywhere between 24-72 hours to receive the refill.    Name of medication?   Insulin Glargine, 2 Unit Dial, (TOUJEO MAX SOLOSTAR) 300 UNIT/ML SOPN  Patient states she injects 44 units   Is this a 90 day supply? Yes  Name and location of pharmacy?   [facility] Phone:  [phone]  Fax:  [phone]       . Is the request for diabetes test strips? No . If yes, what brand? Yes

## 2019-12-23 ENCOUNTER — Other Ambulatory Visit: Payer: Self-pay

## 2019-12-23 ENCOUNTER — Encounter: Payer: Self-pay | Admitting: Internal Medicine

## 2019-12-23 ENCOUNTER — Ambulatory Visit (INDEPENDENT_AMBULATORY_CARE_PROVIDER_SITE_OTHER): Payer: PPO | Admitting: Internal Medicine

## 2019-12-23 ENCOUNTER — Other Ambulatory Visit: Payer: PPO

## 2019-12-23 VITALS — BP 136/76 | HR 81 | Ht 67.0 in | Wt 329.0 lb

## 2019-12-23 DIAGNOSIS — Z794 Long term (current) use of insulin: Secondary | ICD-10-CM | POA: Diagnosis not present

## 2019-12-23 DIAGNOSIS — E1140 Type 2 diabetes mellitus with diabetic neuropathy, unspecified: Secondary | ICD-10-CM | POA: Diagnosis not present

## 2019-12-23 DIAGNOSIS — E1165 Type 2 diabetes mellitus with hyperglycemia: Secondary | ICD-10-CM

## 2019-12-23 DIAGNOSIS — I10 Essential (primary) hypertension: Secondary | ICD-10-CM | POA: Diagnosis not present

## 2019-12-23 DIAGNOSIS — E785 Hyperlipidemia, unspecified: Secondary | ICD-10-CM | POA: Diagnosis not present

## 2019-12-23 DIAGNOSIS — E1065 Type 1 diabetes mellitus with hyperglycemia: Secondary | ICD-10-CM

## 2019-12-23 DIAGNOSIS — I5032 Chronic diastolic (congestive) heart failure: Secondary | ICD-10-CM | POA: Diagnosis not present

## 2019-12-23 DIAGNOSIS — E1122 Type 2 diabetes mellitus with diabetic chronic kidney disease: Secondary | ICD-10-CM

## 2019-12-23 DIAGNOSIS — E1169 Type 2 diabetes mellitus with other specified complication: Secondary | ICD-10-CM

## 2019-12-23 DIAGNOSIS — N1832 Chronic kidney disease, stage 3b: Secondary | ICD-10-CM

## 2019-12-23 DIAGNOSIS — E1121 Type 2 diabetes mellitus with diabetic nephropathy: Secondary | ICD-10-CM | POA: Diagnosis not present

## 2019-12-23 LAB — POCT GLYCOSYLATED HEMOGLOBIN (HGB A1C): Hemoglobin A1C: 8.9 % — AB (ref 4.0–5.6)

## 2019-12-23 LAB — POCT GLUCOSE (DEVICE FOR HOME USE): Glucose Fasting, POC: 277 mg/dL — AB (ref 70–99)

## 2019-12-23 MED ORDER — TOUJEO MAX SOLOSTAR 300 UNIT/ML ~~LOC~~ SOPN: 38.0000 [IU] | PEN_INJECTOR | Freq: Every day | SUBCUTANEOUS | 6 refills | Status: DC

## 2019-12-23 NOTE — Telephone Encounter (Signed)
Called pt and attempted to schedule for an appt. The person that answered the phone stated that the patient was unavailable because the patient was out to another MD appt. Pt will call back later and schedule visit.

## 2019-12-23 NOTE — Patient Instructions (Addendum)
-   Decrease Toujeo to 38 units daily  - Continue Novolog 16 units with each meal   - Novolog correctional insulin: ADD extra units on insulin to your meal-time Novolog dose if your blood sugars are higher than 180. Use the scale below to help guide you:   Blood sugar before meal Number of units to inject  Less than 180 0 unit  181 -  210 1 units  211 -  240 2 units  241 -  270 3 units  271 -  300 4 units  301 -  330 5 units  331 -  360 6 units  361 -  390 7 units      HOW TO TREAT LOW BLOOD SUGARS (Blood sugar LESS THAN 70 MG/DL)  Please follow the RULE OF 15 for the treatment of hypoglycemia treatment (when your (blood sugars are less than 70 mg/dL)    STEP 1: Take 15 grams of carbohydrates when your blood sugar is low, which includes:   3-4 GLUCOSE TABS  OR  3-4 OZ OF JUICE OR REGULAR SODA OR  ONE TUBE OF GLUCOSE GEL     STEP 2: RECHECK blood sugar in 15 MINUTES STEP 3: If your blood sugar is still low at the 15 minute recheck --> then, go back to STEP 1 and treat AGAIN with another 15 grams of carbohydrates.   HOW TO TREAT LOW BLOOD SUGARS (Blood sugar LESS THAN 70 MG/DL)  Please follow the RULE OF 15 for the treatment of hypoglycemia treatment (when your (blood sugars are less than 70 mg/dL)    STEP 1: Take 15 grams of carbohydrates when your blood sugar is low, which includes:   3-4 GLUCOSE TABS  OR  3-4 OZ OF JUICE OR REGULAR SODA OR  ONE TUBE OF GLUCOSE GEL     STEP 2: RECHECK blood sugar in 15 MINUTES STEP 3: If your blood sugar is still low at the 15 minute recheck --> then, go back to STEP 1 and treat AGAIN with another 15 grams of carbohydrates.

## 2019-12-23 NOTE — Progress Notes (Signed)
Name: [name]  Age/ Sex: 85 y.o., female   MRN/ DOB: [id number], July 02, 1934     PCP: [name], NP   Reason for Endocrinology Evaluation: Type 2 Diabetes Mellitus     Initial Endocrinology Clinic Visit: 08/19/2018    PATIENT IDENTIFIER: [name] is a 85 y.o. female with a past medical history of T2DM, CKD III, and chronic pain syndrome . The patient has followed with Endocrinology clinic since 08/19/2018 for consultative assistance with management of her diabetes.  DIABETIC HISTORY:  [name] was diagnosed with T2DM > 40 yrs ago. Pt is a poor historian and unable to recall oral glycemic agents. She has been on insulin therapy for years. Her hemoglobin A1c has ranged from 6.7%  in 07/2017, peaking at 12.1% in 2018.  SUBJECTIVE:   During the last visit (06/25/2019): This was a virtual visit . We continued MDI regimen.   Today (12/23/2019): [name] is here for a follow up visit on diabetes management. She is accompanied by her daughter  [name]. They use the CGM at home but we have never been able to see the data. They did not bring the receiver today. Pt tells me she had a BG of 59 mg/dL this am , per daughter hypoglycemic episodes are rare.       HOME DIABETES REGIMEN:   Toujeo 44 units daily   Novolog 16 units TID   CF: BG -150/30 with meals     GLUCOSE LOG:  Not available       HISTORY:  Past Medical History:  Past Medical History:  Diagnosis Date  . Allergy    takes Mucinex daily as needed  . Anemia, unspecified   . Anxiety    takes Clonazepam daily as needed  . Arthritis   . Cancer (HCC)    breast  . CHF (congestive heart failure) ([place])    takes Furosemide daily  . CKD (chronic kidney disease)   . Coronary artery disease    a. s/p IMI 2004 tx with BMS to RCA;  b. s/p Promus DES to RCA 2/12 c. 06/2014 High risk stress test follow up cath 06/2014 showd patent stent--< med therapy for other mild to moedrate disease   . Coronary  atherosclerosis of native coronary artery   . Depression    takes Cymbalta daily  . Diabetes mellitus    insulin daily  . Dysuria   . GERD (gastroesophageal reflux disease)    takes Protonix daily  . Gout, unspecified    takes Colchicine daily  . Headache    occasionally  . History of blood clots about 22yrs ago   in legs-takes Coumadin   . Hyperlipidemia    takes Pravastatin daily  . Hypertension    takes Imdur and Metoprolol daily  . Insomnia   . Joint pain   . Joint swelling   . Myocardial infarction ([place]) 2004  . Numbness   . Obstructive sleep apnea    does not wear cpap  . Osteoarthritis   . Osteoarthritis   . Osteoarthrosis, unspecified whether generalized or localized, lower leg   . Pain, chronic   . Polymyalgia rheumatica ([place])   . Pulmonary emboli ([place]) 9/13   felt to need lifelong anticoagulation  . Shortness of breath dyspnea    with exertion and has Albuterol inhaler prn  . Type II or unspecified type diabetes mellitus without mention of complication, uncontrolled   . Urinary incontinence  takes Linzess daily  . Vertigo    hx of;was taking Meclizine if needed  . Wears dentures    Past Surgical History:  Past Surgical History:  Procedure Laterality Date  . ABDOMINAL HYSTERECTOMY     partial  . APPENDECTOMY    . blood clots/legs and lungs  2013  . BREAST BIOPSY Left 07/22/2014  . BREAST BIOPSY Left 02/10/2013  . BREAST LUMPECTOMY Left 11/05/2014  . BREAST LUMPECTOMY WITH RADIOACTIVE SEED LOCALIZATION Left 11/05/2014   Procedure: LEFT BREAST LUMPECTOMY WITH RADIOACTIVE SEED LOCALIZATION;  Surgeon: [name], MD;  Location: [facility];  Service: General;  Laterality: Left;  . CARDIAC CATHETERIZATION    . COLONOSCOPY    . CORONARY ANGIOPLASTY  2  . ESOPHAGOGASTRODUODENOSCOPY (EGD) WITH PROPOFOL N/A 11/07/2016   Procedure: ESOPHAGOGASTRODUODENOSCOPY (EGD) WITH PROPOFOL;  Surgeon: Gatha Mayer, MD;  Location: [facility];  Service: Endoscopy;   Laterality: N/A;  . EXCISION OF SKIN TAG Right 11/05/2014   Procedure: EXCISION OF RIGHT EYELID SKIN TAG;  Surgeon: [name], MD;  Location: [facility];  Service: General;  Laterality: Right;  . EYE SURGERY Bilateral    cataract   . GASTRIC BYPASS  1977    reversed in 1979, [place] N/A 06/29/2014   Procedure: LEFT HEART CATHETERIZATION WITH CORONARY ANGIOGRAM;  Surgeon: Troy Sine, MD;  Location: [facility] CATH [facility];  Service: Cardiovascular;  Laterality: N/A;  . MEMBRANE PEEL Right 10/23/2018   Procedure: MEMBRANE PEEL;  Surgeon: Hurman Horn, MD;  Location: [facility];  Service: Ophthalmology;  Laterality: Right;  . MI with stent placement  2004  . PARS PLANA VITRECTOMY Right 10/23/2018   Procedure: PARS PLANA VITRECTOMY WITH 25 GAUGE;  Surgeon: Hurman Horn, MD;  Location: [facility];  Service: Ophthalmology;  Laterality: Right;    Social History:  reports that she has never smoked. She has never used smokeless tobacco. She reports that she does not drink alcohol and does not use drugs. Family History:  Family History  Problem Relation Age of Onset  . Breast cancer Mother 20  . Heart disease Mother   . Throat cancer Father   . Hypertension Father   . Arthritis Father   . Diabetes Father   . Arthritis Sister   . Obesity Sister   . Diabetes Sister   . Heart disease Cousin   . Colon cancer Neg Hx   . Stomach cancer Neg Hx   . Esophageal cancer Neg Hx      HOME MEDICATIONS: Allergies as of 12/23/2019      Reactions   Sulfonamide Derivatives Swelling   Mouth swelling   Aricept [donepezil Hcl] Diarrhea, Nausea And Vomiting   GI upset/loose stools   Tramadol Nausea And Vomiting      Medication List       Accurate as of December 23, 2019  1:36 PM. If you have any questions, ask your nurse or doctor.        albuterol 108 (90 Base) MCG/ACT inhaler Commonly known as: ProAir HFA Inhale 1-2 puffs into the lungs every 6 (six)  hours as needed for wheezing or shortness of breath.   Allergy Relief 10 MG tablet Generic drug: loratadine TAKE 1 TABLET BY MOUTH EVERY DAY   anastrozole 1 MG tablet Commonly known as: ARIMIDEX TAKE 1 TABLET BY MOUTH EVERY DAY   budesonide-formoterol 160-4.5 MCG/ACT inhaler Commonly known as: SYMBICORT Inhale 2 puffs into the lungs 2 (two) times  daily. For bronchitis   DULoxetine 60 MG capsule Commonly known as: CYMBALTA TAKE 1 CAPSULE BY MOUTH EVERY DAY FOR ANXIETY   ergocalciferol 1.25 MG (50000 UT) capsule Commonly known as: VITAMIN D2 Take 1 capsule (50,000 Units total) by mouth once a week.   febuxostat 40 MG tablet Commonly known as: ULORIC TAKE 1 TABLET BY MOUTH EVERY DAY   fluticasone 50 MCG/ACT nasal spray Commonly known as: FLONASE Place 1 spray into both nostrils in the morning and at bedtime.   FreeStyle Libre 14 Day Sensor Misc 1 Units by Does not apply route 4 (four) times daily -  before meals and at bedtime. For diabetes   furosemide 20 MG tablet Commonly known as: LASIX TAKE 1 TABLET BY MOUTH EVERY DAY AS NEEDED   hydrALAZINE 50 MG tablet Commonly known as: APRESOLINE Take 1 tablet (50 mg total) by mouth 2 (two) times daily.   hydrOXYzine 10 MG tablet Commonly known as: ATARAX/VISTARIL Take 1 tablet by mouth three times daily as needed   isosorbide mononitrate 60 MG 24 hr tablet Commonly known as: IMDUR TAKE 1 TABLET BY MOUTH EVERY DAY   meclizine 25 MG tablet Commonly known as: ANTIVERT TAKE 1 TABLET BY MOUTH THREE TIMES DAILY AS NEEDED FOR DIZZINESS   memantine 5 MG tablet Commonly known as: NAMENDA TAKE 2 TABLETS BY MOUTH EVERY MORNING and TAKE 1 TABLET EVERY EVENING FOR memory   metoprolol succinate 50 MG 24 hr tablet Commonly known as: TOPROL-XL Take 50 mg by mouth daily.   nitroGLYCERIN 0.4 MG SL tablet Commonly known as: Nitrostat Take one tablet under the tongue every 5 minutes as needed for chest pain   NovoLOG FlexPen 100  UNIT/ML FlexPen Generic drug: insulin aspart Inject 16 Units into the skin 3 (three) times daily with meals. Max daily dose 70 units   pantoprazole 40 MG tablet Commonly known as: PROTONIX TAKE 1 TABLET BY MOUTH EVERY DAY   pravastatin 20 MG tablet Commonly known as: PRAVACHOL TAKE 1 TABLET BY MOUTH EVERY DAY FOR cholesterol   pregabalin 25 MG capsule Commonly known as: Lyrica Take 1 capsule (25 mg total) by mouth 2 (two) times daily.   sodium polystyrene 15 GM/60ML suspension Commonly known as: SPS TAKE 15 grams (60 mls) BY MOUTH ONCE WEEKLY TO keep potassium down   topiramate 25 MG tablet Commonly known as: TOPAMAX Take 1 tablet (25 mg total) by mouth daily.   Toujeo Max SoloStar 300 UNIT/ML Solostar Pen Generic drug: insulin glargine (2 Unit Dial) Inject 38 Units into the skin daily. Patient assistance program provides What changed: how much to take Changed by: Dorita Sciara, MD   Xarelto 10 MG Tabs tablet Generic drug: rivaroxaban TAKE 1 TABLET BY MOUTH EVERY DAY       PHYSICAL EXAM: VS: BP 136/76 (BP Location: Left Arm, Patient Position: Sitting, Cuff Size: Normal)   Pulse 81   Ht 5\' 7"  (1.702 m)   Wt (!) 329 lb (149.2 kg)   SpO2 98%   BMI 51.53 kg/m    EXAM: General: Pt appears well and is in NAD in a wheelchair   Lungs: Clear with good BS bilat with no rales, rhonchi, or wheezes  Heart: Auscultation: RRR with normal S1 and S2 Periph. circulation: no peripheral edema  Extremities:  BL LE: Trace pretibial edema   Mental Status: Judgment, insight: intact Memory: intact for recent and remote events Mood and affect: no depression, anxiety, or agitation     DATA REVIEWED:  Lab Results  Component Value Date   HGBA1C 8.9 (A) 12/23/2019   HGBA1C 9.8 (A) 03/24/2019   HGBA1C 11.7 (A) 12/20/2018   Lab Results  Component Value Date   MICROALBUR 2.9 06/14/2017   LDLCALC 40 07/17/2018   CREATININE 1.64 (H) 07/24/2019   Lab Results  Component  Value Date   MICRALBCREAT 81 (H) 06/14/2017     Lab Results  Component Value Date   CHOL 90 07/17/2018   HDL 35 (L) 07/17/2018   LDLCALC 40 07/17/2018   TRIG 74 07/17/2018   CHOLHDL 2.6 07/17/2018         ASSESSMENT / PLAN / RECOMMENDATIONS:   1)Type 2 Diabetes Mellitus, Poorly Controlled, With CKD III and macrovascular complications - Most recent A1c of 8.9 %. Goal A1c < 7.5 %.    - A1c down from 9.8%  - Again no glucose data but per memory recall, pt with hypoglycemia in the morning, will reduce basal insulin as below - Her In-office BG 277 mg/dL. She ate breakfast without prandial insulin, I suspect this is the main reason for hyperglycemia.  - Pt states the cost of insulin too high, deduct able $4000. Toujeo application was declined by company, will try Tresiba/Novolog this time  - We discussed how to use the prandial dose+ correction scale of Novolog to the daughter   MEDICATIONS:  Decrease Toujeo to  38 units daily   Continue  Novolog  16 units TID QAC  CF : BG- 150/30  EDUCATION / INSTRUCTIONS:  BG monitoring instructions: Patient is instructed to check her blood sugars 4 times a day, before meals and bedtime.  Call [facility] if: BG persistently < 70  . I reviewed the Rule of 15 for the treatment of hypoglycemia in detail with the patient. Literature supplied.    F/U in 6 months    Signed electronically by: Mack Guise, MD  [facility]  [facility] [address] [place], [place] [address] Phone: [phone] FAX: [phone]   CC: [name], NP [place] [address] Phone: [phone]  Fax: [phone]  Return to Endocrinology clinic as below: Future Appointments  Date Time Provider [place]  12/30/2019  3:40 PM GI-BCG MM 2 GI-BCGMM GI-BREAST CE  03/01/2020  1:00 PM Rankin, [name], MD RDE-RDE None  03/24/2020 11:00 AM [name], NP  PSC-PSC None  04/16/2020 11:00 AM [name], NP PSC-PSC None  04/20/2020  2:15 PM Burtis Junes, NP [facility]

## 2019-12-24 LAB — CBC WITH DIFFERENTIAL/PLATELET
Absolute Monocytes: 691 cells/uL (ref 200–950)
Basophils Absolute: 28 cells/uL (ref 0–200)
Basophils Relative: 0.2 %
Eosinophils Absolute: 254 cells/uL (ref 15–500)
Eosinophils Relative: 1.8 %
HCT: 28.9 % — ABNORMAL LOW (ref 35.0–45.0)
Hemoglobin: 9 g/dL — ABNORMAL LOW (ref 11.7–15.5)
Lymphs Abs: 3299 cells/uL (ref 850–3900)
MCH: 27.4 pg (ref 27.0–33.0)
MCHC: 31.1 g/dL — ABNORMAL LOW (ref 32.0–36.0)
MCV: 87.8 fL (ref 80.0–100.0)
MPV: 11 fL (ref 7.5–12.5)
Monocytes Relative: 4.9 %
Neutro Abs: 9828 cells/uL — ABNORMAL HIGH (ref 1500–7800)
Neutrophils Relative %: 69.7 %
Platelets: 279 10*3/uL (ref 140–400)
RBC: 3.29 10*6/uL — ABNORMAL LOW (ref 3.80–5.10)
RDW: 13.2 % (ref 11.0–15.0)
Total Lymphocyte: 23.4 %
WBC: 14.1 10*3/uL — ABNORMAL HIGH (ref 3.8–10.8)

## 2019-12-24 LAB — COMPLETE METABOLIC PANEL WITH GFR
AG Ratio: 0.9 (calc) — ABNORMAL LOW (ref 1.0–2.5)
ALT: 7 U/L (ref 6–29)
AST: 9 U/L — ABNORMAL LOW (ref 10–35)
Albumin: 3.3 g/dL — ABNORMAL LOW (ref 3.6–5.1)
Alkaline phosphatase (APISO): 75 U/L (ref 37–153)
BUN/Creatinine Ratio: 23 (calc) — ABNORMAL HIGH (ref 6–22)
BUN: 38 mg/dL — ABNORMAL HIGH (ref 7–25)
CO2: 26 mmol/L (ref 20–32)
Calcium: 8.8 mg/dL (ref 8.6–10.4)
Chloride: 107 mmol/L (ref 98–110)
Creat: 1.63 mg/dL — ABNORMAL HIGH (ref 0.60–0.88)
GFR, Est African American: 33 mL/min/{1.73_m2} — ABNORMAL LOW (ref >=60)
GFR, Est Non African American: 29 mL/min/{1.73_m2} — ABNORMAL LOW (ref >=60)
Globulin: 3.6 g/dL (calc) (ref 1.9–3.7)
Glucose, Bld: 114 mg/dL — ABNORMAL HIGH (ref 65–99)
Potassium: 4.3 mmol/L (ref 3.5–5.3)
Sodium: 141 mmol/L (ref 135–146)
Total Bilirubin: 0.3 mg/dL (ref 0.2–1.2)
Total Protein: 6.9 g/dL (ref 6.1–8.1)

## 2019-12-24 LAB — LIPID PANEL
Cholesterol: 117 mg/dL (ref <200)
HDL: 39 mg/dL — ABNORMAL LOW (ref >=50)
LDL Cholesterol (Calc): 58 mg/dL (calc)
Non-HDL Cholesterol (Calc): 78 mg/dL (calc) (ref <130)
Total CHOL/HDL Ratio: 3 (calc) (ref <5.0)
Triglycerides: 114 mg/dL (ref <150)

## 2019-12-24 LAB — HEMOGLOBIN A1C
Hgb A1c MFr Bld: 9.3 % of total Hgb — ABNORMAL HIGH (ref <5.7)
Mean Plasma Glucose: 220 (calc)
eAG (mmol/L): 12.2 (calc)

## 2019-12-30 ENCOUNTER — Ambulatory Visit: Payer: PPO

## 2019-12-30 ENCOUNTER — Ambulatory Visit: Payer: PPO | Attending: Internal Medicine

## 2019-12-30 DIAGNOSIS — Z23 Encounter for immunization: Secondary | ICD-10-CM

## 2019-12-30 NOTE — Progress Notes (Signed)
   Covid-19 Vaccination Clinic  Name:  [name]    MRN: [id number] DOB: Sep 17, 1934  12/30/2019  [name] was observed post Covid-19 immunization for 15 minutes without incident. She was provided with Vaccine Information Sheet and instruction to access the V-Safe system.   [name] was instructed to call 911 with any severe reactions post vaccine: [name] Difficulty breathing  . Swelling of face and throat  . A fast heartbeat  . A bad rash all over body  . Dizziness and weakness

## 2020-01-12 ENCOUNTER — Other Ambulatory Visit: Payer: Self-pay | Admitting: Nurse Practitioner

## 2020-01-12 DIAGNOSIS — E1121 Type 2 diabetes mellitus with diabetic nephropathy: Secondary | ICD-10-CM

## 2020-01-12 DIAGNOSIS — E1140 Type 2 diabetes mellitus with diabetic neuropathy, unspecified: Secondary | ICD-10-CM

## 2020-01-12 DIAGNOSIS — Z794 Long term (current) use of insulin: Secondary | ICD-10-CM

## 2020-01-12 DIAGNOSIS — IMO0002 Reserved for concepts with insufficient information to code with codable children: Secondary | ICD-10-CM

## 2020-01-27 ENCOUNTER — Telehealth: Payer: Self-pay

## 2020-01-27 NOTE — Telephone Encounter (Signed)
Message left on clinical intake voicemail:   Patient requesting referral to neurologist for dizziness and headaches.  Please advise

## 2020-01-27 NOTE — Telephone Encounter (Signed)
Per verbal conversation with [name] patient was referred to neurology in 2018, ok to provider patient with name and contact information.  I called patient x 2 at home number and it gave a continual busy signal. I called patients daughter [name] and she placed me on speaker phone. I advised of Jessica's response and provided patient with the name of [facility] Neurologist Dr.Jaffe, Adam whom she seen in 2018 and phone number [phone]  [facility] plans to call and schedule appointment. [name] is aware if a new referral is needed to call us back and we can further address.

## 2020-01-29 ENCOUNTER — Other Ambulatory Visit: Payer: Self-pay

## 2020-01-29 ENCOUNTER — Ambulatory Visit (INDEPENDENT_AMBULATORY_CARE_PROVIDER_SITE_OTHER): Payer: PPO | Admitting: Nurse Practitioner

## 2020-01-29 ENCOUNTER — Telehealth: Payer: Self-pay

## 2020-01-29 DIAGNOSIS — J309 Allergic rhinitis, unspecified: Secondary | ICD-10-CM

## 2020-01-29 DIAGNOSIS — R519 Headache, unspecified: Secondary | ICD-10-CM

## 2020-01-29 DIAGNOSIS — G4700 Insomnia, unspecified: Secondary | ICD-10-CM

## 2020-01-29 DIAGNOSIS — M542 Cervicalgia: Secondary | ICD-10-CM

## 2020-01-29 DIAGNOSIS — G8929 Other chronic pain: Secondary | ICD-10-CM | POA: Diagnosis not present

## 2020-01-29 MED ORDER — CETIRIZINE HCL 10 MG PO TABS: 10.0000 mg | ORAL_TABLET | Freq: Every day | ORAL | 11 refills | Status: DC

## 2020-01-29 NOTE — Telephone Encounter (Signed)
[name], [name] are scheduled for a virtual visit with your provider today.    Just as we do with appointments in the office, we must obtain your consent to participate.  Your consent will be active for this visit and any virtual visit you may have with one of our providers in the next 365 days.    If you have a MyChart account, I can also send a copy of this consent to you electronically.  All virtual visits are billed to your insurance company just like a traditional visit in the office.  As this is a virtual visit, video technology does not allow for your provider to perform a traditional examination.  This may limit your provider's ability to fully assess your condition.  If your provider identifies any concerns that need to be evaluated in person or the need to arrange testing such as labs, EKG, etc, we will make arrangements to do so.    Although advances in technology are sophisticated, we cannot ensure that it will always work on either your end or our end.  If the connection with a video visit is poor, we may have to switch to a telephone visit.  With either a video or telephone visit, we are not always able to ensure that we have a secure connection.   I need to obtain your verbal consent now.   Are you willing to proceed with your visit today?   [name] has provided verbal consent on 01/29/2020 for a virtual visit (video or telephone).   Carroll Kinds, CMA 01/29/2020  10:46 AM

## 2020-01-29 NOTE — Patient Instructions (Addendum)
STOP ADVIL PM  Increase melatonin to 6 mg at bedtime- after you take melatonin then go to bed Do not get in the bed unless you are sleeping No naps  No caffeine or chocolate in the afternoon  Restart allergy pill (zytec10 mg daily) restart flonase, nasal spray  To get xray of your neck at [facility]  Can use heating pad to neck 3 times daily ~20-30 mins Muscle rub AFTER heat  Tylenol 500 mg 1-2 tablets every 8 hours as needed  MAX of 1000 mg per dose of tylenol

## 2020-01-29 NOTE — Progress Notes (Signed)
This service is provided via telemedicine  No vital signs collected/recorded due to the encounter was a telemedicine visit.   Location of [name] (ex: home, work):  Home  [name] consents to a telephone visit:  Yes, see encounter dated 01/29/2020  Location of the provider (ex: office, home):  [facility]  Name of any referring provider:  N/A  Names of all persons participating in the telemedicine service and their role in the encounter:  [name], Nurse Practitioner, Carroll Kinds, CMA, and [name].   Time spent on call:  5 minutes with medical assistant.      Careteam: [name] Care Team: [name], NP as PCP - General (Geriatric Medicine) [name], [name], MD (Inactive) as Consulting Physician (Cardiology) [name], MD as Consulting Physician (Ophthalmology) [name], MD as Consulting Physician (General Surgery) [name], MD (Inactive) as Referring Physician (Obstetrics and Gynecology) [name] [name], MD as Consulting Physician (Ophthalmology) [facility], [name], MD as Consulting Physician (Endocrinology) [name], MD as Consulting Physician (Hematology) [name], DO as Consulting Physician (Neurology)  PLACE OF SERVICE:  [place]  Advanced Directive information    Allergies  Allergen Reactions  . Sulfonamide Derivatives Swelling    Mouth swelling  . Aricept [Donepezil Hcl] Diarrhea and Nausea And Vomiting    GI upset/loose stools  . Tramadol Nausea And Vomiting    Chief Complaint  [name] presents with  . Acute Visit    Referral to Dr. Loretta Plume (Neurology) for pain in left side on neck starting from neck and moving to head. [name] also complains of dizziness. [name] states she gets dizzy when she stands up. Whole head hurts, nose, eyes. [name] does take medication for dizziness daily.     HPI: [name] is a 85 y.o. female for referral to Neurology She has previously been seeing  neurologist due to headache.  Reports her ears hurt, head hurt.  She is off balance.  Unsure if she is having sinus congestion.  Reports she does have a runny nose and coughing more. Mostly dry cough but sometimes she will have a deep cough. Reports fullness in ears.  No drainage out of ares. No sore throat.  Taking benadryl at night to help her sleep and an was taking an allergy pill but not taking any more. Also not taking her flonase.   Reports headache is in the back on the neck side up her neck, sharp pains that radiate to the back of her head. Also with pain in her  Left shoulder down to elbow. She has been complaining of neck pain since March. Order for xray of neck placed in march but did not get. Reports pain shooting into head has been getting worse.  Reports pain is "every once in a while" reports pain once a week but sister says its more often than that. Sister says she complains daily.  She can be sitting and complaining of the pain. Sharp, shooting pain that only last a few seconds.  Headache is more consistent.  Taking tylenol 3 tablets at a time and advil PM  After her fall she had someone come out to her house to do an xray of her hip after a fall per sister.   Mouth is exteremly dry and constipated.   Review of Systems:  Review of Systems  Constitutional: Positive for malaise/fatigue. Negative for chills, fever and weight loss.  HENT: Positive for congestion and ear pain. Negative for sinus pain, sore throat  and tinnitus.   Respiratory: Negative for cough and shortness of breath.   Cardiovascular: Negative for chest pain and palpitations.  Gastrointestinal: Negative for abdominal pain, constipation, diarrhea and heartburn.  Genitourinary: Negative for dysuria, frequency and urgency.  Musculoskeletal: Positive for back pain, myalgias and neck pain. Negative for falls and joint pain.  Skin: Negative.   Neurological: Positive for tingling and sensory change. Negative for  dizziness and headaches.  Endo/Heme/Allergies: Positive for environmental allergies.  Psychiatric/Behavioral: Negative for depression and memory loss. The [name] has insomnia.     Past Medical History:  Diagnosis Date  . Allergy    takes Mucinex daily as needed  . Anemia, unspecified   . Anxiety    takes Clonazepam daily as needed  . Arthritis   . Cancer (HCC)    breast  . CHF (congestive heart failure) ([place])    takes Furosemide daily  . CKD (chronic kidney disease)   . Coronary artery disease    a. s/p IMI 2004 tx with BMS to RCA;  b. s/p Promus DES to RCA 2/12 c. 06/2014 High risk stress test follow up cath 06/2014 showd patent stent--< med therapy for other mild to moedrate disease   . Coronary atherosclerosis of native coronary artery   . Depression    takes Cymbalta daily  . Diabetes mellitus    insulin daily  . Dysuria   . GERD (gastroesophageal reflux disease)    takes Protonix daily  . Gout, unspecified    takes Colchicine daily  . Headache    occasionally  . History of blood clots about 24yrs ago   in legs-takes Coumadin   . Hyperlipidemia    takes Pravastatin daily  . Hypertension    takes Imdur and Metoprolol daily  . Insomnia   . Joint pain   . Joint swelling   . Myocardial infarction ([place]) 2004  . Numbness   . Obstructive sleep apnea    does not wear cpap  . Osteoarthritis   . Osteoarthritis   . Osteoarthrosis, unspecified whether generalized or localized, lower leg   . Pain, chronic   . Polymyalgia rheumatica ([place])   . Pulmonary emboli ([place]) 9/13   felt to need lifelong anticoagulation  . Shortness of breath dyspnea    with exertion and has Albuterol inhaler prn  . Type II or unspecified type diabetes mellitus without mention of complication, uncontrolled   . Urinary incontinence    takes Linzess daily  . Vertigo    hx of;was taking Meclizine if needed  . Wears dentures    Past Surgical History:  Procedure Laterality Date  . ABDOMINAL  HYSTERECTOMY     partial  . APPENDECTOMY    . blood clots/legs and lungs  2013  . BREAST BIOPSY Left 07/22/2014  . BREAST BIOPSY Left 02/10/2013  . BREAST LUMPECTOMY Left 11/05/2014  . BREAST LUMPECTOMY WITH RADIOACTIVE SEED LOCALIZATION Left 11/05/2014   Procedure: LEFT BREAST LUMPECTOMY WITH RADIOACTIVE SEED LOCALIZATION;  Surgeon: [name], MD;  Location: [place];  Service: General;  Laterality: Left;  . CARDIAC CATHETERIZATION    . COLONOSCOPY    . CORONARY ANGIOPLASTY  2  . ESOPHAGOGASTRODUODENOSCOPY (EGD) WITH PROPOFOL N/A 11/07/2016   Procedure: ESOPHAGOGASTRODUODENOSCOPY (EGD) WITH PROPOFOL;  Surgeon: Gatha Mayer, MD;  Location: [facility];  Service: Endoscopy;  Laterality: N/A;  . EXCISION OF SKIN TAG Right 11/05/2014   Procedure: EXCISION OF RIGHT EYELID SKIN TAG;  Surgeon: [name], MD;  Location: [place];  Service:  General;  Laterality: Right;  . EYE SURGERY Bilateral    cataract   . GASTRIC BYPASS  1977    reversed in 1979, [place] N/A 06/29/2014   Procedure: LEFT HEART CATHETERIZATION WITH CORONARY ANGIOGRAM;  Surgeon: Troy Sine, MD;  Location: [facility];  Service: Cardiovascular;  Laterality: N/A;  . MEMBRANE PEEL Right 10/23/2018   Procedure: MEMBRANE PEEL;  Surgeon: Hurman Horn, MD;  Location: [place];  Service: Ophthalmology;  Laterality: Right;  . MI with stent placement  2004  . PARS PLANA VITRECTOMY Right 10/23/2018   Procedure: PARS PLANA VITRECTOMY WITH 25 GAUGE;  Surgeon: Hurman Horn, MD;  Location: [place];  Service: Ophthalmology;  Laterality: Right;   Social History:   reports that she has never smoked. She has never used smokeless tobacco. She reports that she does not drink alcohol and does not use drugs.  Family History  Problem Relation Age of Onset  . Breast cancer Mother 45  . Heart disease Mother   . Throat cancer Father   . Hypertension Father   . Arthritis Father    . Diabetes Father   . Arthritis Sister   . Obesity Sister   . Diabetes Sister   . Heart disease Cousin   . Colon cancer Neg Hx   . Stomach cancer Neg Hx   . Esophageal cancer Neg Hx     Medications: [name]'s Medications  New Prescriptions   No medications on file  Previous Medications   ALBUTEROL (PROAIR HFA) 108 (90 BASE) MCG/ACT INHALER    Inhale 1-2 puffs into the lungs every 6 (six) hours as needed for wheezing or shortness of breath.   ALLERGY RELIEF 10 MG TABLET    TAKE 1 TABLET BY MOUTH EVERY DAY   ANASTROZOLE (ARIMIDEX) 1 MG TABLET    TAKE 1 TABLET BY MOUTH EVERY DAY   BUDESONIDE-FORMOTEROL (SYMBICORT) 160-4.5 MCG/ACT INHALER    Inhale 2 puffs into the lungs 2 (two) times daily. For bronchitis   CONTINUOUS BLOOD GLUC SENSOR (FREESTYLE LIBRE 14 DAY SENSOR) MISC    USE UNIT TO TEST FOUR TIMES DAILY, BEFORE MEALS AND AT BEDTIME.   DULOXETINE (CYMBALTA) 60 MG CAPSULE    TAKE 1 CAPSULE BY MOUTH EVERY DAY FOR ANXIETY   ERGOCALCIFEROL (VITAMIN D2) 1.25 MG (50000 UT) CAPSULE    Take 1 capsule (50,000 Units total) by mouth once a week.   FEBUXOSTAT (ULORIC) 40 MG TABLET    TAKE 1 TABLET BY MOUTH EVERY DAY   FLUTICASONE (FLONASE) 50 MCG/ACT NASAL SPRAY    Place 1 spray into both nostrils in the morning and at bedtime.   FUROSEMIDE (LASIX) 20 MG TABLET    TAKE 1 TABLET BY MOUTH EVERY DAY AS NEEDED   HYDRALAZINE (APRESOLINE) 50 MG TABLET    Take 1 tablet (50 mg total) by mouth 2 (two) times daily.   HYDROXYZINE (ATARAX/VISTARIL) 10 MG TABLET    Take 1 tablet by mouth three times daily as needed   INSULIN ASPART (NOVOLOG FLEXPEN) 100 UNIT/ML FLEXPEN    Inject 16 Units into the skin 3 (three) times daily with meals. Max daily dose 70 units   INSULIN GLARGINE, 2 UNIT DIAL, (TOUJEO MAX SOLOSTAR) 300 UNIT/ML SOLOSTAR PEN    Inject 38 Units into the skin daily. [name] assistance program provides   ISOSORBIDE MONONITRATE (IMDUR) 60 MG 24 HR TABLET    TAKE 1 TABLET BY MOUTH EVERY DAY  MECLIZINE (ANTIVERT) 25 MG TABLET    TAKE 1 TABLET BY MOUTH THREE TIMES DAILY AS NEEDED FOR DIZZINESS   MEMANTINE (NAMENDA) 5 MG TABLET    TAKE 2 TABLETS BY MOUTH EVERY MORNING and TAKE 1 TABLET EVERY EVENING FOR memory   METOPROLOL SUCCINATE (TOPROL-XL) 50 MG 24 HR TABLET    Take 50 mg by mouth daily.   NITROGLYCERIN (NITROSTAT) 0.4 MG SL TABLET    Take one tablet under the tongue every 5 minutes as needed for chest pain   PANTOPRAZOLE (PROTONIX) 40 MG TABLET    TAKE 1 TABLET BY MOUTH EVERY DAY   PRAVASTATIN (PRAVACHOL) 20 MG TABLET    TAKE 1 TABLET BY MOUTH EVERY DAY FOR cholesterol   PREGABALIN (LYRICA) 25 MG CAPSULE    Take 1 capsule (25 mg total) by mouth 2 (two) times daily.   SODIUM POLYSTYRENE (SPS) 15 GM/60ML SUSPENSION    TAKE 15 grams (60 mls) BY MOUTH ONCE WEEKLY TO keep potassium down   TOPIRAMATE (TOPAMAX) 25 MG TABLET    Take 1 tablet (25 mg total) by mouth daily.   XARELTO 10 MG TABS TABLET    TAKE 1 TABLET BY MOUTH EVERY DAY  Modified Medications   No medications on file  Discontinued Medications   No medications on file    Physical Exam:  There were no vitals filed for this visit. There is no height or weight on file to calculate BMI. Wt Readings from Last 3 Encounters:  12/23/19 (!) 329 lb (149.2 kg)  09/12/19 (!) 320 lb (145.2 kg)  07/29/19 (!) 323 lb (146.5 kg)      Labs reviewed: Basic Metabolic Panel: Recent Labs    02/12/19 1129 07/24/19 1438 12/23/19 1021  NA 140 137 141  K 4.7 4.6 4.3  CL 106 106 107  CO2 28 23 26   GLUCOSE 218* 294* 114*  BUN 31* 35* 38*  CREATININE 1.52* 1.64* 1.63*  CALCIUM 8.9 8.9 8.8   Liver Function Tests: Recent Labs    01/30/19 0240 01/30/19 0240 02/12/19 1129 07/24/19 1438 12/23/19 1021  AST 17   < > 18 9* 9*  ALT 16   < > 16 6 7   ALKPHOS 61  --   --  92  --   BILITOT 0.3   < > 0.5 0.3 0.3  PROT 7.2   < > 6.6 7.6 6.9  ALBUMIN 2.8*  --   --  2.8*  --    < > = values in this interval not displayed.   No  results for input(s): LIPASE, AMYLASE in the last 8760 hours. No results for input(s): AMMONIA in the last 8760 hours. CBC: Recent Labs    02/12/19 1129 07/24/19 1438 12/23/19 1021  WBC 7.2 12.1* 14.1*  NEUTROABS 4,176 8.7* 9,828*  HGB 9.1* 9.4* 9.0*  HCT 28.0* 31.2* 28.9*  MCV 90.0 89.9 87.8  PLT 200 278 279   Lipid Panel: Recent Labs    12/23/19 1021  CHOL 117  HDL 39*  LDLCALC 58  TRIG 114  CHOLHDL 3.0   TSH: No results for input(s): TSH in the last 8760 hours. A1C: Lab Results  Component Value Date   HGBA1C 8.9 (A) 12/23/2019     Assessment/Plan 1. Neck pain Ongoing neck pain with shooting pain up into her head and down left arm. Xray of neck was ordered in March but she has not gotten imaging yet, sister plans to get this done at this time.  To  avoid NSAIDs due to renal function Tylenol 1000 mg q 8 hours as needed pain Max dose of tylenol 1000 mg per dose- 3000 mg in 24 hours  2. Insomnia, unspecified type -taking advil PM. Encouraged to stop due to side effects and went into details about this.  -to start melatonin 6 mg qhs -to only get in bed for sleep -not to take naps during the day. -increase in activity  3. Chronic nonintractable headache, unspecified headache type Ongoing. ?neck pain vs allergies contributing. Will follow up xray and restart allergy medication.   4. Allergic rhinitis, unspecified seasonality, unspecified trigger - cetirizine (ZYRTEC) 10 MG tablet; Take 1 tablet (10 mg total) by mouth daily.  Dispense: 30 tablet; Refill: 11 -restart flonase nasal spray  Next appt: 03/24/2020  Carlos American. Harle Battiest  [facility] [phone]    Virtual Visit via Video Note  I connected with [name] on 02/03/20 at 11:00 AM EDT by a video enabled telemedicine application and verified that I am speaking with the correct person using two identifiers.  Location: [name]: home Provider: [place]   I discussed  the limitations of evaluation and management by telemedicine and the availability of in person appointments. The [name] expressed understanding and agreed to proceed.    I discussed the assessment and treatment plan with the [name]. The [name] was provided an opportunity to ask questions and all were answered. The [name] agreed with the plan and demonstrated an understanding of the instructions.   The [name] was advised to call back or seek an in-person evaluation if the symptoms worsen or if the condition fails to improve as anticipated.  I provided 25 minutes of non-face-to-face time during this encounter.  Carlos American. Dewaine Oats, AGNP Avs printed and mailed.   [name]

## 2020-02-02 ENCOUNTER — Ambulatory Visit
Admission: RE | Admit: 2020-02-02 | Discharge: 2020-02-02 | Disposition: A | Discharge status: Home / Self Care | Payer: PPO | Source: Ambulatory Visit | Attending: Nurse Practitioner | Admitting: Nurse Practitioner

## 2020-02-02 ENCOUNTER — Other Ambulatory Visit: Payer: Self-pay

## 2020-02-02 DIAGNOSIS — M542 Cervicalgia: Secondary | ICD-10-CM

## 2020-02-02 DIAGNOSIS — M47812 Spondylosis without myelopathy or radiculopathy, cervical region: Secondary | ICD-10-CM | POA: Diagnosis not present

## 2020-02-03 ENCOUNTER — Other Ambulatory Visit: Payer: Self-pay | Admitting: Nurse Practitioner

## 2020-02-03 DIAGNOSIS — G629 Polyneuropathy, unspecified: Secondary | ICD-10-CM

## 2020-02-03 DIAGNOSIS — M542 Cervicalgia: Secondary | ICD-10-CM

## 2020-02-03 DIAGNOSIS — G894 Chronic pain syndrome: Secondary | ICD-10-CM

## 2020-02-14 ENCOUNTER — Emergency Department (HOSPITAL_COMMUNITY): Payer: PPO

## 2020-02-14 ENCOUNTER — Inpatient Hospital Stay (HOSPITAL_COMMUNITY)
Admission: EM | Admit: 2020-02-14 | Discharge: 2020-02-18 | DRG: 247 | Disposition: A | Discharge status: Home Health | Payer: PPO | Attending: Internal Medicine | Admitting: Internal Medicine

## 2020-02-14 ENCOUNTER — Other Ambulatory Visit: Payer: Self-pay

## 2020-02-14 DIAGNOSIS — I517 Cardiomegaly: Secondary | ICD-10-CM | POA: Diagnosis not present

## 2020-02-14 DIAGNOSIS — D72829 Elevated white blood cell count, unspecified: Secondary | ICD-10-CM | POA: Diagnosis present

## 2020-02-14 DIAGNOSIS — E785 Hyperlipidemia, unspecified: Secondary | ICD-10-CM | POA: Diagnosis present

## 2020-02-14 DIAGNOSIS — T82855A Stenosis of coronary artery stent, initial encounter: Secondary | ICD-10-CM | POA: Diagnosis present

## 2020-02-14 DIAGNOSIS — Z955 Presence of coronary angioplasty implant and graft: Secondary | ICD-10-CM

## 2020-02-14 DIAGNOSIS — I214 Non-ST elevation (NSTEMI) myocardial infarction: Secondary | ICD-10-CM | POA: Diagnosis present

## 2020-02-14 DIAGNOSIS — M109 Gout, unspecified: Secondary | ICD-10-CM | POA: Diagnosis present

## 2020-02-14 DIAGNOSIS — N289 Disorder of kidney and ureter, unspecified: Secondary | ICD-10-CM | POA: Diagnosis not present

## 2020-02-14 DIAGNOSIS — Z20822 Contact with and (suspected) exposure to covid-19: Secondary | ICD-10-CM | POA: Diagnosis present

## 2020-02-14 DIAGNOSIS — F0390 Unspecified dementia without behavioral disturbance: Secondary | ICD-10-CM | POA: Diagnosis present

## 2020-02-14 DIAGNOSIS — Z833 Family history of diabetes mellitus: Secondary | ICD-10-CM

## 2020-02-14 DIAGNOSIS — R110 Nausea: Secondary | ICD-10-CM | POA: Diagnosis present

## 2020-02-14 DIAGNOSIS — I2510 Atherosclerotic heart disease of native coronary artery without angina pectoris: Secondary | ICD-10-CM | POA: Diagnosis not present

## 2020-02-14 DIAGNOSIS — Z7951 Long term (current) use of inhaled steroids: Secondary | ICD-10-CM

## 2020-02-14 DIAGNOSIS — C50919 Malignant neoplasm of unspecified site of unspecified female breast: Secondary | ICD-10-CM | POA: Diagnosis present

## 2020-02-14 DIAGNOSIS — I130 Hypertensive heart and chronic kidney disease with heart failure and stage 1 through stage 4 chronic kidney disease, or unspecified chronic kidney disease: Secondary | ICD-10-CM | POA: Diagnosis present

## 2020-02-14 DIAGNOSIS — R079 Chest pain, unspecified: Secondary | ICD-10-CM | POA: Diagnosis present

## 2020-02-14 DIAGNOSIS — J4489 Other specified chronic obstructive pulmonary disease: Secondary | ICD-10-CM | POA: Diagnosis present

## 2020-02-14 DIAGNOSIS — R519 Headache, unspecified: Secondary | ICD-10-CM | POA: Diagnosis present

## 2020-02-14 DIAGNOSIS — R54 Age-related physical debility: Secondary | ICD-10-CM | POA: Diagnosis present

## 2020-02-14 DIAGNOSIS — Z885 Allergy status to narcotic agent status: Secondary | ICD-10-CM

## 2020-02-14 DIAGNOSIS — J449 Chronic obstructive pulmonary disease, unspecified: Secondary | ICD-10-CM | POA: Diagnosis present

## 2020-02-14 DIAGNOSIS — Z66 Do not resuscitate: Secondary | ICD-10-CM | POA: Diagnosis present

## 2020-02-14 DIAGNOSIS — R531 Weakness: Secondary | ICD-10-CM | POA: Diagnosis present

## 2020-02-14 DIAGNOSIS — N179 Acute kidney failure, unspecified: Secondary | ICD-10-CM | POA: Diagnosis present

## 2020-02-14 DIAGNOSIS — Z23 Encounter for immunization: Secondary | ICD-10-CM

## 2020-02-14 DIAGNOSIS — Z86711 Personal history of pulmonary embolism: Secondary | ICD-10-CM

## 2020-02-14 DIAGNOSIS — I10 Essential (primary) hypertension: Secondary | ICD-10-CM | POA: Diagnosis present

## 2020-02-14 DIAGNOSIS — Z808 Family history of malignant neoplasm of other organs or systems: Secondary | ICD-10-CM

## 2020-02-14 DIAGNOSIS — I5032 Chronic diastolic (congestive) heart failure: Secondary | ICD-10-CM | POA: Diagnosis present

## 2020-02-14 DIAGNOSIS — I25118 Atherosclerotic heart disease of native coronary artery with other forms of angina pectoris: Secondary | ICD-10-CM | POA: Diagnosis present

## 2020-02-14 DIAGNOSIS — D649 Anemia, unspecified: Secondary | ICD-10-CM | POA: Diagnosis not present

## 2020-02-14 DIAGNOSIS — Z86718 Personal history of other venous thrombosis and embolism: Secondary | ICD-10-CM

## 2020-02-14 DIAGNOSIS — Z79899 Other long term (current) drug therapy: Secondary | ICD-10-CM

## 2020-02-14 DIAGNOSIS — Z7901 Long term (current) use of anticoagulants: Secondary | ICD-10-CM | POA: Diagnosis not present

## 2020-02-14 DIAGNOSIS — Z888 Allergy status to other drugs, medicaments and biological substances status: Secondary | ICD-10-CM

## 2020-02-14 DIAGNOSIS — G8929 Other chronic pain: Secondary | ICD-10-CM | POA: Diagnosis present

## 2020-02-14 DIAGNOSIS — Z9884 Bariatric surgery status: Secondary | ICD-10-CM | POA: Diagnosis not present

## 2020-02-14 DIAGNOSIS — R0602 Shortness of breath: Secondary | ICD-10-CM

## 2020-02-14 DIAGNOSIS — R0601 Orthopnea: Secondary | ICD-10-CM

## 2020-02-14 DIAGNOSIS — K219 Gastro-esophageal reflux disease without esophagitis: Secondary | ICD-10-CM | POA: Diagnosis present

## 2020-02-14 DIAGNOSIS — Z8261 Family history of arthritis: Secondary | ICD-10-CM

## 2020-02-14 DIAGNOSIS — Z6841 Body Mass Index (BMI) 40.0 and over, adult: Secondary | ICD-10-CM

## 2020-02-14 DIAGNOSIS — D631 Anemia in chronic kidney disease: Secondary | ICD-10-CM | POA: Diagnosis present

## 2020-02-14 DIAGNOSIS — R0789 Other chest pain: Secondary | ICD-10-CM | POA: Diagnosis not present

## 2020-02-14 DIAGNOSIS — J90 Pleural effusion, not elsewhere classified: Secondary | ICD-10-CM | POA: Diagnosis not present

## 2020-02-14 DIAGNOSIS — R0902 Hypoxemia: Secondary | ICD-10-CM | POA: Diagnosis not present

## 2020-02-14 DIAGNOSIS — G4733 Obstructive sleep apnea (adult) (pediatric): Secondary | ICD-10-CM | POA: Diagnosis present

## 2020-02-14 DIAGNOSIS — Z794 Long term (current) use of insulin: Secondary | ICD-10-CM

## 2020-02-14 DIAGNOSIS — Z882 Allergy status to sulfonamides status: Secondary | ICD-10-CM | POA: Diagnosis not present

## 2020-02-14 DIAGNOSIS — E1122 Type 2 diabetes mellitus with diabetic chronic kidney disease: Secondary | ICD-10-CM | POA: Diagnosis present

## 2020-02-14 DIAGNOSIS — N1832 Chronic kidney disease, stage 3b: Secondary | ICD-10-CM | POA: Diagnosis present

## 2020-02-14 DIAGNOSIS — Z803 Family history of malignant neoplasm of breast: Secondary | ICD-10-CM

## 2020-02-14 DIAGNOSIS — Z993 Dependence on wheelchair: Secondary | ICD-10-CM

## 2020-02-14 DIAGNOSIS — E1142 Type 2 diabetes mellitus with diabetic polyneuropathy: Secondary | ICD-10-CM | POA: Diagnosis present

## 2020-02-14 DIAGNOSIS — Z8249 Family history of ischemic heart disease and other diseases of the circulatory system: Secondary | ICD-10-CM

## 2020-02-14 DIAGNOSIS — R5381 Other malaise: Secondary | ICD-10-CM | POA: Diagnosis present

## 2020-02-14 DIAGNOSIS — M542 Cervicalgia: Secondary | ICD-10-CM | POA: Diagnosis not present

## 2020-02-14 DIAGNOSIS — G4489 Other headache syndrome: Secondary | ICD-10-CM | POA: Diagnosis not present

## 2020-02-14 DIAGNOSIS — R778 Other specified abnormalities of plasma proteins: Secondary | ICD-10-CM | POA: Diagnosis not present

## 2020-02-14 LAB — BASIC METABOLIC PANEL
Anion gap: 10 (ref 5–15)
BUN: 31 mg/dL — ABNORMAL HIGH (ref 8–23)
CO2: 25 mmol/L (ref 22–32)
Calcium: 9 mg/dL (ref 8.9–10.3)
Chloride: 106 mmol/L (ref 98–111)
Creatinine, Ser: 1.48 mg/dL — ABNORMAL HIGH (ref 0.44–1.00)
GFR, Estimated: 32 mL/min — ABNORMAL LOW (ref >60)
Glucose, Bld: 166 mg/dL — ABNORMAL HIGH (ref 70–99)
Potassium: 4.5 mmol/L (ref 3.5–5.1)
Sodium: 141 mmol/L (ref 135–145)

## 2020-02-14 LAB — CBC
HCT: 29.8 % — ABNORMAL LOW (ref 36.0–46.0)
Hemoglobin: 8.8 g/dL — ABNORMAL LOW (ref 12.0–15.0)
MCH: 27.2 pg (ref 26.0–34.0)
MCHC: 29.5 g/dL — ABNORMAL LOW (ref 30.0–36.0)
MCV: 92.3 fL (ref 80.0–100.0)
Platelets: 294 10*3/uL (ref 150–400)
RBC: 3.23 MIL/uL — ABNORMAL LOW (ref 3.87–5.11)
RDW: 14.2 % (ref 11.5–15.5)
WBC: 12.2 10*3/uL — ABNORMAL HIGH (ref 4.0–10.5)
nRBC: 0 % (ref 0.0–0.2)

## 2020-02-14 LAB — TROPONIN I (HIGH SENSITIVITY)
Troponin I (High Sensitivity): 44 ng/L — ABNORMAL HIGH (ref <18)
Troponin I (High Sensitivity): 81 ng/L — ABNORMAL HIGH (ref <18)

## 2020-02-14 MED ORDER — HEPARIN (PORCINE) 25000 UT/250ML-% IV SOLN
1350.0000 [IU]/h | INTRAVENOUS | Status: DC
  Administered 2020-02-15: 1800 [IU]/h via INTRAVENOUS
  Administered 2020-02-15: 1550 [IU]/h via INTRAVENOUS
  Administered 2020-02-16: 1350 [IU]/h via INTRAVENOUS
  Filled 2020-02-14 (×3): qty 250

## 2020-02-14 MED ORDER — ASPIRIN 81 MG PO CHEW
324.0000 mg | CHEWABLE_TABLET | Freq: Once | ORAL | Status: AC
  Administered 2020-02-14: 324 mg via ORAL
  Filled 2020-02-14: qty 4

## 2020-02-14 MED ORDER — NITROGLYCERIN 2 % TD OINT
1.0000 [in_us] | TOPICAL_OINTMENT | Freq: Once | TRANSDERMAL | Status: AC
  Administered 2020-02-15: 1 [in_us] via TOPICAL
  Filled 2020-02-14: qty 1

## 2020-02-14 NOTE — ED Notes (Signed)
[phone] [name], daughter would like an update

## 2020-02-14 NOTE — ED Notes (Signed)
Pt states she started having CP that radiated to her back this AM. Since then pain has resolved. Pt asking how long she will have to be here.

## 2020-02-14 NOTE — Progress Notes (Signed)
ANTICOAGULATION CONSULT NOTE - Initial Consult  Pharmacy Consult for heparin Indication: chest pain/ACS  Allergies  Allergen Reactions  . Sulfonamide Derivatives Swelling    Mouth swelling  . Aricept [Donepezil Hcl] Diarrhea and Nausea And Vomiting    GI upset/loose stools  . Tramadol Nausea And Vomiting    Patient Measurements: Height: 5\' 7"  (170.2 cm) Weight: (!) 160.1 kg (353 lb) IBW/kg (Calculated) : 61.6 Heparin Dosing Weight: 101.9kg  Vital Signs: Temp: 98 F (36.7 C) (10/09 1804) Temp Source: Oral (10/09 1804) BP: 165/67 (10/09 2027) Pulse Rate: 69 (10/09 2027)  Labs: Recent Labs    02/14/20 1624 02/14/20 2037  HGB 8.8*  --   HCT 29.8*  --   PLT 294  --   CREATININE 1.48*  --   TROPONINIHS 44* 81*    Estimated Creatinine Clearance: 45.9 mL/min (A) (by C-G formula based on SCr of 1.48 mg/dL (H)).   Medical History: Past Medical History:  Diagnosis Date  . Allergy    takes Mucinex daily as needed  . Anemia, unspecified   . Anxiety    takes Clonazepam daily as needed  . Arthritis   . Cancer (HCC)    breast  . CHF (congestive heart failure) ([place])    takes Furosemide daily  . CKD (chronic kidney disease)   . Coronary artery disease    a. s/p IMI 2004 tx with BMS to RCA;  b. s/p Promus DES to RCA 2/12 c. 06/2014 High risk stress test follow up cath 06/2014 showd patent stent--< med therapy for other mild to moedrate disease   . Coronary atherosclerosis of native coronary artery   . Depression    takes Cymbalta daily  . Diabetes mellitus    insulin daily  . Dysuria   . GERD (gastroesophageal reflux disease)    takes Protonix daily  . Gout, unspecified    takes Colchicine daily  . Headache    occasionally  . History of blood clots about 4yrs ago   in legs-takes Coumadin   . Hyperlipidemia    takes Pravastatin daily  . Hypertension    takes Imdur and Metoprolol daily  . Insomnia   . Joint pain   . Joint swelling   . Myocardial infarction  ([place]) 2004  . Numbness   . Obstructive sleep apnea    does not wear cpap  . Osteoarthritis   . Osteoarthritis   . Osteoarthrosis, unspecified whether generalized or localized, lower leg   . Pain, chronic   . Polymyalgia rheumatica ([place])   . Pulmonary emboli ([place]) 9/13   felt to need lifelong anticoagulation  . Shortness of breath dyspnea    with exertion and has Albuterol inhaler prn  . Type II or unspecified type diabetes mellitus without mention of complication, uncontrolled   . Urinary incontinence    takes Linzess daily  . Vertigo    hx of;was taking Meclizine if needed  . Wears dentures     Medications:  Infusions:  . heparin      Assessment: 70 yof presented to the ED with CP. Troponin mildly elevated. She is on chronic low dose xarelto. To transition to IV heparin. Baseline Hgb is low at 8.8 and platelets are WNL. No bleeding noted. Last dose of xarelto was this afternoon but will not delay heparin as the xarelto dose is a low prophylactic dose.   Goal of Therapy:  Heparin level 0.3-0.7 units/ml aPTT 66-102 seconds Monitor platelets by anticoagulation protocol: Yes   Plan:  Heparin gtt 1800 units/hr - had required high rates in the past Check an 8 hr heparin level and aPTT Daily heparin level, aPTT and CBC  Charistopher Rumble, Rande Lawman 02/14/2020,9:39 PM

## 2020-02-14 NOTE — Consult Note (Addendum)
Cardiology Consultation:   Patient ID: [name] MRN: [id number]; DOB: 1935-04-10  Admit date: 02/14/2020 Date of Consult: 02/15/2020  Primary Care Provider: [name], NP [facility] Cardiologist: No primary care provider on file.  [facility] Electrophysiologist:  None   Patient Profile:   76F with morbid obesity, CAD (s/p BMS to RCA 2004, s/p DES to RCA 2016), HFpEF, DM2, GERD, chronic pain, prior breast CA and anemia presents with chest pain and was found to have NSTMEI.    History of Present Illness:   [name] had severe left-sided chest pain of 10 minutes duration during the afternoon around 2:00.  She says that she has had these occasional episodes over the past year where they only occur every 1 to 2 months and are never this severe.  She has extremely poor functional status and chronic pain for which she takes as needed Percocets.  She reports that this episode of pain was while she was laying down and that it had some radiation to her jaw.  She does have chronic shoulder pain but felt this was very different.  She also has had worsening shortness of breath over the past year for which she was thought to have bronchitis deodorants which provide minimal symptom.  Unfortunately her baseline functional status is fairly poor and she needs wheelchair-bound and only able to go from bed to rolling walker around the house.  She has a commode next to her that he cannot make it to the restroom alone.  Fortunately she does have good family support and has constant health with ADLs around the house.  She does not remember what her prior anginal reports were that resulted in cardiac catheterization with PCI.  She denies any PND or orthopnea.  Weight gain is difficult to assess along with fluid status given significant obesity.  Past Medical History:  Diagnosis Date  . Allergy    takes Mucinex daily as needed  . Anemia, unspecified   . Anxiety    takes Clonazepam daily as  needed  . Arthritis   . Cancer (HCC)    breast  . CHF (congestive heart failure) ([place])    takes Furosemide daily  . CKD (chronic kidney disease)   . Coronary artery disease    a. s/p IMI 2004 tx with BMS to RCA;  b. s/p Promus DES to RCA 2/12 c. 06/2014 High risk stress test follow up cath 06/2014 showd patent stent--< med therapy for other mild to moedrate disease   . Coronary atherosclerosis of native coronary artery   . Depression    takes Cymbalta daily  . Diabetes mellitus    insulin daily  . Dysuria   . GERD (gastroesophageal reflux disease)    takes Protonix daily  . Gout, unspecified    takes Colchicine daily  . Headache    occasionally  . History of blood clots about 84yrs ago   in legs-takes Coumadin   . Hyperlipidemia    takes Pravastatin daily  . Hypertension    takes Imdur and Metoprolol daily  . Insomnia   . Joint pain   . Joint swelling   . Myocardial infarction ([place]) 2004  . Numbness   . Obstructive sleep apnea    does not wear cpap  . Osteoarthritis   . Osteoarthritis   . Osteoarthrosis, unspecified whether generalized or localized, lower leg   . Pain, chronic   . Polymyalgia rheumatica ([place])   . Pulmonary emboli ([place]) 9/13   felt  to need lifelong anticoagulation  . Shortness of breath dyspnea    with exertion and has Albuterol inhaler prn  . Type II or unspecified type diabetes mellitus without mention of complication, uncontrolled   . Urinary incontinence    takes Linzess daily  . Vertigo    hx of;was taking Meclizine if needed  . Wears dentures    Past Surgical History:  Procedure Laterality Date  . ABDOMINAL HYSTERECTOMY     partial  . APPENDECTOMY    . blood clots/legs and lungs  2013  . BREAST BIOPSY Left 07/22/2014  . BREAST BIOPSY Left 02/10/2013  . BREAST LUMPECTOMY Left 11/05/2014  . BREAST LUMPECTOMY WITH RADIOACTIVE SEED LOCALIZATION Left 11/05/2014   Procedure: LEFT BREAST LUMPECTOMY WITH RADIOACTIVE SEED LOCALIZATION;  Surgeon:  [name], MD;  Location: [place];  Service: General;  Laterality: Left;  . CARDIAC CATHETERIZATION    . COLONOSCOPY    . CORONARY ANGIOPLASTY  2  . ESOPHAGOGASTRODUODENOSCOPY (EGD) WITH PROPOFOL N/A 11/07/2016   Procedure: ESOPHAGOGASTRODUODENOSCOPY (EGD) WITH PROPOFOL;  Surgeon: Gatha Mayer, MD;  Location: [facility];  Service: Endoscopy;  Laterality: N/A;  . EXCISION OF SKIN TAG Right 11/05/2014   Procedure: EXCISION OF RIGHT EYELID SKIN TAG;  Surgeon: [name], MD;  Location: [place];  Service: General;  Laterality: Right;  . EYE SURGERY Bilateral    cataract   . GASTRIC BYPASS  1977    reversed in 1979, [place] N/A 06/29/2014   Procedure: LEFT HEART CATHETERIZATION WITH CORONARY ANGIOGRAM;  Surgeon: Troy Sine, MD;  Location: [facility] C[facility];  Service: Cardiovascular;  Laterality: N/A;  . MEMBRANE PEEL Right 10/23/2018   Procedure: MEMBRANE PEEL;  Surgeon: Hurman Horn, MD;  Location: [place];  Service: Ophthalmology;  Laterality: Right;  . MI with stent placement  2004  . PARS PLANA VITRECTOMY Right 10/23/2018   Procedure: PARS PLANA VITRECTOMY WITH 25 GAUGE;  Surgeon: Hurman Horn, MD;  Location: [place];  Service: Ophthalmology;  Laterality: Right;    Home Medications:  Prior to Admission medications   Medication Sig Start Date End Date Taking? Authorizing Provider  acetaminophen (TYLENOL) 500 MG tablet Take 1,000 mg by mouth daily.   Yes [provider]  albuterol (PROAIR HFA) 108 (90 Base) MCG/ACT inhaler Inhale 1-2 puffs into the lungs every 6 (six) hours as needed for wheezing or shortness of breath. 12/19/18  Yes Nichole Chandler, NP  anastrozole (ARIMIDEX) 1 MG tablet TAKE 1 TABLET BY MOUTH EVERY DAY Patient taking differently: Take 1 mg by mouth daily.  10/22/19  Yes [name], MD  budesonide-formoterol [facility]) 160-4.5 MCG/ACT inhaler Inhale 2 puffs into the lungs 2 (two) times  daily. For bronchitis Patient taking differently: Inhale 2 puffs into the lungs 2 (two) times daily as needed (shortness of breath/wheezing). For bronchitis 09/03/18  Yes Nichole Chandler, NP  Cyanocobalamin (VITAMIN B-12 PO) Take 1 tablet by mouth every evening.   Yes [provider]  DULoxetine (CYMBALTA) 60 MG capsule TAKE 1 CAPSULE BY MOUTH EVERY DAY FOR ANXIETY Patient taking differently: Take 60 mg by mouth daily.  10/22/19  Yes Nichole Chandler, NP  ergocalciferol (VITAMIN D2) 1.25 MG (50000 UT) capsule Take 1 capsule (50,000 Units total) by mouth once a week. Patient taking differently: Take 50,000 Units by mouth every Thursday.  07/28/19  Yes [name], MD  febuxostat (ULORIC) 40 MG tablet TAKE 1 TABLET  BY MOUTH EVERY DAY Patient taking differently: Take 40 mg by mouth daily.  10/23/19  Yes Nichole Chandler, NP  fluticasone (FLONASE) 50 MCG/ACT nasal spray Place 1 spray into both nostrils in the morning and at bedtime. Patient taking differently: Place 1 spray into both nostrils 2 (two) times daily as needed for allergies.  09/12/19  Yes Nichole Chandler, NP  furosemide (LASIX) 20 MG tablet TAKE 1 TABLET BY MOUTH EVERY DAY AS NEEDED Patient taking differently: Take 20 mg by mouth daily as needed for fluid.  12/08/19  Yes Nichole Chandler, NP  hydrALAZINE (APRESOLINE) 50 MG tablet Take 1 tablet (50 mg total) by mouth 2 (two) times daily. 12/18/19  Yes Nichole Chandler, NP  hydrOXYzine (ATARAX/VISTARIL) 10 MG tablet Take 1 tablet by mouth three times daily as needed Patient taking differently: Take 10 mg by mouth 3 (three) times daily as needed for itching.  11/27/19  Yes Nichole Chandler, NP  insulin aspart (NOVOLOG FLEXPEN) 100 UNIT/ML FlexPen Inject 16 Units into the skin 3 (three) times daily with meals. Max daily dose 70 units Patient taking differently: Inject 16-22 Units into the skin See admin instructions. Inject 16 units subcutaneously three times daily with  meals plus adjustment for CBG 04/14/19  Yes Shamleffer, [name], MD  insulin glargine, 2 Unit Dial, (TOUJEO MAX SOLOSTAR) 300 UNIT/ML Solostar Pen Inject 38 Units into the skin daily. Patient assistance program provides 12/23/19  Yes Shamleffer, [name], MD  isosorbide mononitrate (IMDUR) 60 MG 24 hr tablet TAKE 1 TABLET BY MOUTH EVERY DAY Patient taking differently: Take 60 mg by mouth daily.  10/22/19  Yes Nichole Chandler, NP  loratadine (CLARITIN) 10 MG tablet Take 10 mg by mouth daily as needed for allergies.   Yes [provider]  meclizine (ANTIVERT) 25 MG tablet TAKE 1 TABLET BY MOUTH THREE TIMES DAILY AS NEEDED FOR DIZZINESS Patient taking differently: Take 25 mg by mouth daily.  09/12/19  Yes Nichole Chandler, NP  memantine (NAMENDA) 5 MG tablet TAKE 2 TABLETS BY MOUTH EVERY MORNING and TAKE 1 TABLET EVERY EVENING FOR memory Patient taking differently: Take 5-10 mg by mouth See admin instructions. Take 2 tablets (10mg  totally) by mouth in the morning; then take 1 tablet (5 mg totally) by mouth in the evening 10/09/19  Yes Nichole Chandler, NP  metoprolol succinate (TOPROL-XL) 50 MG 24 hr tablet Take 50 mg by mouth daily. 07/18/19  Yes [provider]  nitroGLYCERIN (NITROSTAT) 0.4 MG SL tablet Take one tablet under the tongue every 5 minutes as needed for chest pain Patient taking differently: Place 0.4 mg under the tongue every 5 (five) minutes as needed for chest pain.  09/24/13  Yes Nichole Chandler, NP  pantoprazole (PROTONIX) 40 MG tablet TAKE 1 TABLET BY MOUTH EVERY DAY Patient taking differently: Take 40 mg by mouth daily.  12/18/19  Yes Nichole Chandler, NP  pravastatin (PRAVACHOL) 20 MG tablet TAKE 1 TABLET BY MOUTH EVERY DAY FOR cholesterol Patient taking differently: Take 20 mg by mouth at bedtime.  10/23/19  Yes Nichole Chandler, NP  pregabalin (LYRICA) 25 MG capsule Take 1 capsule (25 mg total) by mouth 2 (two) times daily. 09/12/19  Yes  Nichole Chandler, NP  sodium polystyrene (SPS) 15 GM/60ML suspension TAKE 15 grams (60 mls) BY MOUTH ONCE WEEKLY TO keep potassium down Patient taking differently: Take 15 g by mouth every Thursday.  11/17/19  Yes Nichole Chandler,  NP  topiramate (TOPAMAX) 25 MG tablet Take 1 tablet (25 mg total) by mouth daily. Patient taking differently: Take 25 mg by mouth at bedtime.  10/22/19  Yes Eubanks, Carlos American, NP  XARELTO 10 MG TABS tablet TAKE 1 TABLET BY MOUTH EVERY DAY Patient taking differently: Take 10 mg by mouth daily.  12/18/19  Yes Nichole Chandler, NP  cetirizine (ZYRTEC) 10 MG tablet Take 1 tablet (10 mg total) by mouth daily. Patient not taking: Reported on 02/14/2020 01/29/20   Nichole Chandler, NP  Continuous Blood Gluc Sensor (FREESTYLE LIBRE 14 DAY SENSOR) MISC USE UNIT TO TEST FOUR TIMES DAILY, BEFORE MEALS AND AT BEDTIME. 01/13/20   Nichole Chandler, NP    Inpatient Medications: Scheduled Meds:  Continuous Infusions: . heparin 1,800 Units/hr (02/15/20 0000)   PRN Meds:   Allergies:    Allergies  Allergen Reactions  . Sulfonamide Derivatives Swelling    Mouth swelling  . Aricept [Donepezil Hcl] Diarrhea and Nausea And Vomiting    GI upset/loose stools  . Tramadol Nausea And Vomiting    Social History:   Social History   Socioeconomic History  . Marital status: Widowed    Spouse name: Not on file  . Number of children: Not on file  . Years of education: Not on file  . Highest education level: Not on file  Occupational History  . Occupation: retired  Tobacco Use  . Smoking status: Never Smoker  . Smokeless tobacco: Never Used  Vaping Use  . Vaping Use: Never used  Substance and Sexual Activity  . Alcohol use: No    Alcohol/week: 0.0 standard drinks  . Drug use: No  . Sexual activity: Not Currently  Other Topics Concern  . Not on file  Social History Narrative  . Not on file   Social Determinants of Health   Financial Resource Strain:   .  Difficulty of Paying Living Expenses: Not on file  Food Insecurity:   . Worried About Charity fundraiser in the Last Year: Not on file  . Ran Out of Food in the Last Year: Not on file  Transportation Needs:   . Lack of Transportation (Medical): Not on file  . Lack of Transportation (Non-Medical): Not on file  Physical Activity:   . Days of Exercise per Week: Not on file  . Minutes of Exercise per Session: Not on file  Stress:   . Feeling of Stress : Not on file  Social Connections:   . Frequency of Communication with Friends and Family: Not on file  . Frequency of Social Gatherings with Friends and Family: Not on file  . Attends Religious Services: Not on file  . Active Member of Clubs or Organizations: Not on file  . Attends Archivist Meetings: Not on file  . Marital Status: Not on file  Intimate Partner Violence:   . Fear of Current or Ex-Partner: Not on file  . Emotionally Abused: Not on file  . Physically Abused: Not on file  . Sexually Abused: Not on file    Family History:   Family History  Problem Relation Age of Onset  . Breast cancer Mother 28  . Heart disease Mother   . Throat cancer Father   . Hypertension Father   . Arthritis Father   . Diabetes Father   . Arthritis Sister   . Obesity Sister   . Diabetes Sister   . Heart disease Cousin   . Colon cancer Neg Hx   .  Stomach cancer Neg Hx   . Esophageal cancer Neg Hx     ROS:  Review of Systems: [y] = yes, [ ]  = no       General: Weight gain [ ] ; Weight loss [ ] ; Anorexia [ ] ; Fatigue [ ] ; Fever [ ] ; Chills [ ] ; Weakness [ ]     Cardiac: Chest pain/pressure [y]; Resting SOB [ ] ; Exertional SOB [y]; Orthopnea [ ] ; Pedal Edema [ ] ; Palpitations [ ] ; Syncope [ ] ; Presyncope [ ] ; Paroxysmal nocturnal dyspnea [ ]     Pulmonary: Cough [ ] ; Wheezing [ ] ; Hemoptysis [ ] ; Sputum [ ] ; Snoring [ ]     GI: Vomiting [ ] ; Dysphagia [ ] ; Melena [ ] ; Hematochezia [ ] ; Heartburn [ ] ; Abdominal pain [ ] ;  Constipation [ ] ; Diarrhea [ ] ; BRBPR [ ]     GU: Hematuria [ ] ; Dysuria [ ] ; Nocturia [ ]   Vascular: Pain in legs with walking [ ] ; Pain in feet with lying flat [ ] ; Non-healing sores [ ] ; Stroke [ ] ; TIA [ ] ; Slurred speech [ ] ;    Neuro: Headaches [ ] ; Vertigo [ ] ; Seizures [ ] ; Paresthesias [ ] ;Blurred vision [ ] ; Diplopia [ ] ; Vision changes [ ]     Ortho/Skin: Arthritis [ ] ; Joint pain [ ] ; Muscle pain [ ] ; Joint swelling [ ] ; Back Pain [ ] ; Rash [ ]     Psych: Depression [ ] ; Anxiety [ ]     Heme: Bleeding problems [ ] ; Clotting disorders [ ] ; Anemia [ ]     Endocrine: Diabetes [ ] ; Thyroid dysfunction [ ]    Physical Exam/Data:   Vitals:   02/14/20 1804 02/14/20 2027 02/14/20 2030 02/14/20 2352  BP: (!) 163/86 (!) 165/67 (!) 158/83 (!) 156/67  Pulse: 74 69 65 70  Resp: 17 19 20 16   Temp: 98 F (36.7 C)     TempSrc: Oral     SpO2: 98% 98% 98% 100%  Weight:      Height:       No intake or output data in the 24 hours ending 02/15/20 0019 Last 3 Weights 02/14/2020 12/23/2019 09/12/2019  Weight (lbs) 353 lb 329 lb 320 lb  Weight (kg) 160.12 kg 149.233 kg 145.151 kg     Body mass index is 55.29 kg/m.  General:  Well nourished, well developed, in no acute distress HEENT: normal Lymph: no adenopathy Neck: unable to assess with body habitus Endocrine:  No thryomegaly Vascular: No carotid bruits; FA pulses 2+ bilaterally without bruits  Cardiac:  normal S1, S2; RRR; no murmur  Lungs:  clear to auscultation bilaterally, no wheezing, rhonchi or rales  Abd: soft, nontender, no hepatomegaly  Ext: no edema Musculoskeletal:  No deformities, BUE and BLE strength normal and equal Skin: warm and dry  Neuro:  CNs 2-12 intact, no focal abnormalities noted Psych:  Normal affect   EKG:  The EKG was personally reviewed and demonstrates: NSR, no STE Telemetry:  Telemetry was personally reviewed and demonstrates: NSR   Relevant CV Studies:  TTE Result date: 10/09/17 - Procedure  narrative: Transthoracic echocardiography. The study  was technically difficult, as a result of body habitus.  - Left ventricle: The cavity size was normal. Wall thickness was  increased in a pattern of moderate LVH. There was severe  concentric hypertrophy. Systolic function was vigorous. The  estimated ejection fraction was in the range of 65% to 70%.  Doppler parameters are consistent with abnormal left ventricular  relaxation (grade 1  diastolic dysfunction). The E/e&' ratio is  between 8-15, suggesting indeterminate LV filling pressure.  - Mitral valve: Poorly visualized. Calcified annulus. There was no  significant regurgitation.  - Left atrium: The atrium was normal in size.  - Inferior vena cava: The vessel was normal in size. The  respirophasic diameter changes were in the normal range (>= 50%),  consistent with normal central venous pressure.  - Technically difficult study. Compared to a prior study in 07/2017,  the LVEF is higher at 65-70%.   Laboratory Data:  High Sensitivity Troponin:   Recent Labs  Lab 02/14/20 1624 02/14/20 2037  TROPONINIHS 44* 81*     Chemistry Recent Labs  Lab 02/14/20 1624  NA 141  K 4.5  CL 106  CO2 25  GLUCOSE 166*  BUN 31*  CREATININE 1.48*  CALCIUM 9.0  GFRNONAA 32*  ANIONGAP 10    No results for input(s): PROT, ALBUMIN, AST, ALT, ALKPHOS, BILITOT in the last 168 hours. Hematology Recent Labs  Lab 02/14/20 1624  WBC 12.2*  RBC 3.23*  HGB 8.8*  HCT 29.8*  MCV 92.3  MCH 27.2  MCHC 29.5*  RDW 14.2  PLT 294   BNPNo results for input(s): BNP, PROBNP in the last 168 hours.  DDimer No results for input(s): DDIMER in the last 168 hours.  Radiology/Studies:  DG Chest 2 View  Result Date: 02/14/2020 CLINICAL DATA:  Chest pain EXAM: CHEST - 2 VIEW COMPARISON:  Sep 08, 2019 FINDINGS: The heart size and mediastinal contours are within normal limits. Both lungs are clear. There is chronic elevation of right  hemidiaphragm. The visualized skeletal structures are unremarkable. IMPRESSION: No active cardiopulmonary disease. Electronically Signed   By: Abelardo Diesel M.D.   On: 02/14/2020 16:54  [id number]} TIMI Risk Score for Unstable Angina or Non-ST Elevation MI:   [name] TIMI risk score is 5, which indicates a 26% risk of all cause mortality, new or recurrent myocardial infarction or need for urgent revascularization in the next 14 days.   Assessment and Plan:   1. NSTEMI 36F with morbid obesity, CAD (s/p BMS to RCA 2004, s/p DES to RCA 2016), HFpEF, DM2, GERD, chronic pain, prior breast CA and anemia presents with chest pain and was found to have NSTEMI.  Troponins increased from 44-81 and her creatinine is similar to his baseline around 1.5 and a GFR of 32.  Her anemia is chronic.  She is not limited with her prior anginal equivalents were but I do worry that she has had progressive CAD given her risk factors including uncontrolled DM 2, morbid obesity, and uncontrolled hypertension. She has not had cardiac imaging in several years so they can be helpful to get an echo while awaiting coronary angiography. - routine TTE - continue ASA/heparin - currently CP free, hold off on P2Y12i currently, has DM2 but hopefully will have lesion amenable to PCI - d/c pravastatin, start atorva 80 mg PO qhs  - continue home toprol XL 50 mg PO daily - continue home imdur - lipids/A1c, will need better management of DM2 - plan for coronary evaluation Monday   For questions or updates, please contact [place] Please consult [email or web address] for contact info under   Signed, Dion Body, MD  02/15/2020 12:19 AM

## 2020-02-14 NOTE — ED Notes (Signed)
Pt found for Vitals recheck.

## 2020-02-14 NOTE — ED Notes (Signed)
Pt did not respond when called X3.

## 2020-02-14 NOTE — ED Triage Notes (Addendum)
Pt called EMS for central non radiating chest pain, resolving without intervention PTA. Pt also c/o back and neck pain but took a percocet prior to EMS arrival, which has relieved pain. Patient without complaints at this time.

## 2020-02-14 NOTE — ED Provider Notes (Signed)
[facility] EMERGENCY DEPARTMENT Provider Note   CSN: [id number] Arrival date & time: 02/14/20  1536   History Chief Complaint  Patient presents with  . Chest Pain    [name] is a 85 y.o. female.  The history is provided by the patient.  Chest Pain She has history of hypertension, diabetes, hyperlipidemia, coronary artery disease status post stent placement, diastolic heart failure, chronic kidney disease, pulmonary embolism anticoagulated on rivaroxaban and comes in because of an episode of chest discomfort.  She was at rest when she noted a heavy feeling which started in her neck and radiated to her head, back, chest.  There is associated dyspnea, diaphoresis.  She has chronic nausea which did not seem to change.  Symptoms lasted for about 30 minutes before resolving.  She has had similar episodes in the past but has not sought medical attention.  While present, nothing seemed to make the discomfort better or worse.  She is a non-smoker and has been vaccinated against COVID-19.  Past Medical History:  Diagnosis Date  . Allergy    takes Mucinex daily as needed  . Anemia, unspecified   . Anxiety    takes Clonazepam daily as needed  . Arthritis   . Cancer (HCC)    breast  . CHF (congestive heart failure) ([place])    takes Furosemide daily  . CKD (chronic kidney disease)   . Coronary artery disease    a. s/p IMI 2004 tx with BMS to RCA;  b. s/p Promus DES to RCA 2/12 c. 06/2014 High risk stress test follow up cath 06/2014 showd patent stent--< med therapy for other mild to moedrate disease   . Coronary atherosclerosis of native coronary artery   . Depression    takes Cymbalta daily  . Diabetes mellitus    insulin daily  . Dysuria   . GERD (gastroesophageal reflux disease)    takes Protonix daily  . Gout, unspecified    takes Colchicine daily  . Headache    occasionally  . History of blood clots about 33yrs ago   in legs-takes Coumadin   .  Hyperlipidemia    takes Pravastatin daily  . Hypertension    takes Imdur and Metoprolol daily  . Insomnia   . Joint pain   . Joint swelling   . Myocardial infarction ([place]) 2004  . Numbness   . Obstructive sleep apnea    does not wear cpap  . Osteoarthritis   . Osteoarthritis   . Osteoarthrosis, unspecified whether generalized or localized, lower leg   . Pain, chronic   . Polymyalgia rheumatica ([place])   . Pulmonary emboli ([place]) 9/13   felt to need lifelong anticoagulation  . Shortness of breath dyspnea    with exertion and has Albuterol inhaler prn  . Type II or unspecified type diabetes mellitus without mention of complication, uncontrolled   . Urinary incontinence    takes Linzess daily  . Vertigo    hx of;was taking Meclizine if needed  . Wears dentures     Patient Active Problem List   Diagnosis Date Noted  . Follow-up examination after eye surgery 08/27/2019  . Right epiretinal membrane 08/27/2019  . Vitreomacular adhesion of right eye 08/27/2019  . Diabetes mellitus without complication ([place]) 84/69/6295  . Intermediate stage nonexudative age-related macular degeneration of both eyes 08/27/2019  . Moderate nonproliferative diabetic retinopathy of both eyes without macular edema associated with type 2 diabetes mellitus ([place]) 08/27/2019  . Pulmonary embolus (  [place]) 07/15/2019  . Type 2 diabetes mellitus with hyperglycemia, with long-term current use of insulin ([place]) 03/25/2019  . Pneumonia due to COVID-19 virus 01/25/2019  . Type 2 diabetes mellitus with diabetic neuropathy, with long-term current use of insulin ([place]) 12/13/2017  . CHF (congestive heart failure) ([place]) 10/08/2017  . Hyperkalemia 07/26/2017  . Anxiety associated with depression 06/08/2017  . GERD with esophagitis 11/15/2016  . Memory loss 11/15/2016  . Multiple benign nevi 11/15/2016  . Esophageal dysphagia 11/07/2016  . Severe episode of recurrent major depressive disorder, without psychotic features  ([place]) 06/23/2016  . Endometrial polyp 06/23/2016  . Vaginitis and vulvovaginitis 06/23/2016  . Cough variant asthma 11/17/2015  . Vitamin D deficiency 08/02/2015  . Breast cancer ([place]) 04/26/2015  . Dyslipidemia associated with type 2 diabetes mellitus ([place]) 02/06/2015  . Type 2 diabetes mellitus with stage 3b chronic kidney disease, with long-term current use of insulin ([place]) 02/06/2015  . Chronic pain syndrome 02/06/2015  . Morbid obesity with BMI of 50.0-59.9, adult ([place]) 12/18/2014  . CKD (chronic kidney disease) stage 3, GFR 30-59 ml/min (HCC) 12/18/2014  . Gout 10/27/2014  . Breast mass 10/27/2014  . Encounter for therapeutic drug monitoring 10/08/2014  . Abnormal stress test 09/27/2014  . Vertigo 09/24/2014  . Chronic bronchitis ([place]) 09/23/2014  . Preoperative clearance 08/16/2014  . DOE (dyspnea on exertion) 06/24/2014  . Postmenopausal bleeding 06/04/2014  . CN (constipation) 05/10/2014  . History of pulmonary embolus (PE) 04/22/2014  . COPD (chronic obstructive pulmonary disease) ([place]) 04/21/2014  . Estrogen deficiency 02/19/2014  . Breast cancer screening 02/19/2014  . Weight gain 02/19/2014  . Pain in the chest 12/31/2013  . Type 2 diabetes mellitus with diabetic foot deformity ([place]) 07/29/2013  . OSA (obstructive sleep apnea) 02/24/2013  . Need for prophylactic vaccination and inoculation against influenza 01/08/2013  . Insomnia 09/17/2012  . Debility 08/01/2012  . Osteoarthritis 08/01/2012  . Dysphagia, unspecified(787.20) 07/31/2012  . GERD (gastroesophageal reflux disease) 07/31/2012  . Long term current use of anticoagulant therapy 02/02/2012  . Chronic diastolic congestive heart failure ([place]) 01/29/2012  . History of deep venous thrombosis 01/27/2012  . Abdominal pain 01/26/2012  . Cholelithiasis 06/06/2010  . Situational depression 06/04/2009  . ORTHOPNEA 03/09/2009  . Morbid (severe) obesity due to excess calories ([place]) 02/15/2009  . Microcytic anemia  02/15/2009  . TENSION HEADACHE 01/07/2008  . Headache 01/07/2008  . Fungal dermatitis 11/13/2007  . CAD (coronary artery disease) 08/29/2007  . URINARY INCONTINENCE 08/29/2007  . Hyperlipidemia 12/03/2006  . Essential hypertension 12/03/2006  . Personal history of other diseases of the digestive system 12/03/2006    Past Surgical History:  Procedure Laterality Date  . ABDOMINAL HYSTERECTOMY     partial  . APPENDECTOMY    . blood clots/legs and lungs  2013  . BREAST BIOPSY Left 07/22/2014  . BREAST BIOPSY Left 02/10/2013  . BREAST LUMPECTOMY Left 11/05/2014  . BREAST LUMPECTOMY WITH RADIOACTIVE SEED LOCALIZATION Left 11/05/2014   Procedure: LEFT BREAST LUMPECTOMY WITH RADIOACTIVE SEED LOCALIZATION;  Surgeon: [name], MD;  Location: [facility];  Service: General;  Laterality: Left;  . CARDIAC CATHETERIZATION    . COLONOSCOPY    . CORONARY ANGIOPLASTY  2  . ESOPHAGOGASTRODUODENOSCOPY (EGD) WITH PROPOFOL N/A 11/07/2016   Procedure: ESOPHAGOGASTRODUODENOSCOPY (EGD) WITH PROPOFOL;  Surgeon: Gatha Mayer, MD;  Location: [facility];  Service: Endoscopy;  Laterality: N/A;  . EXCISION OF SKIN TAG Right 11/05/2014   Procedure: EXCISION OF RIGHT EYELID SKIN TAG;  Surgeon: Nathaneil Canary  Ninfa Linden, MD;  Location: [facility];  Service: General;  Laterality: Right;  . EYE SURGERY Bilateral    cataract   . GASTRIC BYPASS  1977    reversed in 1979, [place] N/A 06/29/2014   Procedure: LEFT HEART CATHETERIZATION WITH CORONARY ANGIOGRAM;  Surgeon: Troy Sine, MD;  Location: [facility] CATH LAB;  Service: Cardiovascular;  Laterality: N/A;  . MEMBRANE PEEL Right 10/23/2018   Procedure: MEMBRANE PEEL;  Surgeon: Hurman Horn, MD;  Location: [facility];  Service: Ophthalmology;  Laterality: Right;  . MI with stent placement  2004  . PARS PLANA VITRECTOMY Right 10/23/2018   Procedure: PARS PLANA VITRECTOMY WITH 25 GAUGE;  Surgeon: Hurman Horn, MD;  Location:  [facility];  Service: Ophthalmology;  Laterality: Right;     OB History   No obstetric history on file.     Family History  Problem Relation Age of Onset  . Breast cancer Mother 82  . Heart disease Mother   . Throat cancer Father   . Hypertension Father   . Arthritis Father   . Diabetes Father   . Arthritis Sister   . Obesity Sister   . Diabetes Sister   . Heart disease Cousin   . Colon cancer Neg Hx   . Stomach cancer Neg Hx   . Esophageal cancer Neg Hx     Social History   Tobacco Use  . Smoking status: Never Smoker  . Smokeless tobacco: Never Used  Vaping Use  . Vaping Use: Never used  Substance Use Topics  . Alcohol use: No    Alcohol/week: 0.0 standard drinks  . Drug use: No    Home Medications Prior to Admission medications   Medication Sig Start Date End Date Taking? Authorizing Provider  albuterol (PROAIR HFA) 108 (90 Base) MCG/ACT inhaler Inhale 1-2 puffs into the lungs every 6 (six) hours as needed for wheezing or shortness of breath. 12/19/18   [name], NP  anastrozole (ARIMIDEX) 1 MG tablet TAKE 1 TABLET BY MOUTH EVERY DAY 10/22/19   [name], MD  budesonide-formoterol [facility]) 160-4.5 MCG/ACT inhaler Inhale 2 puffs into the lungs 2 (two) times daily. For bronchitis 09/03/18   [name], NP  cetirizine (ZYRTEC) 10 MG tablet Take 1 tablet (10 mg total) by mouth daily. 01/29/20   [name], NP  Continuous Blood Gluc Sensor (FREESTYLE LIBRE 14 DAY SENSOR) MISC USE UNIT TO TEST FOUR TIMES DAILY, BEFORE MEALS AND AT BEDTIME. 01/13/20   [name], NP  DULoxetine (CYMBALTA) 60 MG capsule TAKE 1 CAPSULE BY MOUTH EVERY DAY FOR ANXIETY 10/22/19   [name], NP  ergocalciferol (VITAMIN D2) 1.25 MG (50000 UT) capsule Take 1 capsule (50,000 Units total) by mouth once a week. 07/28/19   [name], MD  febuxostat (ULORIC) 40 MG tablet TAKE 1 TABLET BY MOUTH EVERY DAY 10/23/19   [name], NP  fluticasone  (FLONASE) 50 MCG/ACT nasal spray Place 1 spray into both nostrils in the morning and at bedtime. 09/12/19   [name], NP  furosemide (LASIX) 20 MG tablet TAKE 1 TABLET BY MOUTH EVERY DAY AS NEEDED 12/08/19   [name], NP  hydrALAZINE (APRESOLINE) 50 MG tablet Take 1 tablet (50 mg total) by mouth 2 (two) times daily. 12/18/19   [name], NP  hydrOXYzine (ATARAX/VISTARIL) 10 MG tablet Take 1 tablet by mouth three times daily as  needed 11/27/19   [name], NP  insulin aspart (NOVOLOG FLEXPEN) 100 UNIT/ML FlexPen Inject 16 Units into the skin 3 (three) times daily with meals. Max daily dose 70 units 04/14/19   Shamleffer, [name], MD  insulin glargine, 2 Unit Dial, (TOUJEO MAX SOLOSTAR) 300 UNIT/ML Solostar Pen Inject 38 Units into the skin daily. Patient assistance program provides 12/23/19   Shamleffer, [name], MD  isosorbide mononitrate (IMDUR) 60 MG 24 hr tablet TAKE 1 TABLET BY MOUTH EVERY DAY 10/22/19   [name], NP  meclizine (ANTIVERT) 25 MG tablet TAKE 1 TABLET BY MOUTH THREE TIMES DAILY AS NEEDED FOR DIZZINESS 09/12/19   [name], NP  memantine (NAMENDA) 5 MG tablet TAKE 2 TABLETS BY MOUTH EVERY MORNING and TAKE 1 TABLET EVERY EVENING FOR memory 10/09/19   [name], NP  metoprolol succinate (TOPROL-XL) 50 MG 24 hr tablet Take 50 mg by mouth daily. 07/18/19   [provider]  nitroGLYCERIN (NITROSTAT) 0.4 MG SL tablet Take one tablet under the tongue every 5 minutes as needed for chest pain 09/24/13   [name], NP  pantoprazole (PROTONIX) 40 MG tablet TAKE 1 TABLET BY MOUTH EVERY DAY 12/18/19   [name], NP  pravastatin (PRAVACHOL) 20 MG tablet TAKE 1 TABLET BY MOUTH EVERY DAY FOR cholesterol 10/23/19   [name], NP  pregabalin (LYRICA) 25 MG capsule Take 1 capsule (25 mg total) by mouth 2 (two) times daily. 09/12/19   [name], NP  sodium polystyrene (SPS) 15 GM/60ML suspension  TAKE 15 grams (60 mls) BY MOUTH ONCE WEEKLY TO keep potassium down 11/17/19   [name], NP  topiramate (TOPAMAX) 25 MG tablet Take 1 tablet (25 mg total) by mouth daily. 10/22/19   [name], NP  XARELTO 10 MG TABS tablet TAKE 1 TABLET BY MOUTH EVERY DAY 12/18/19   [name], NP    Allergies    Sulfonamide derivatives, Aricept [donepezil hcl], and Tramadol  Review of Systems   Review of Systems  Cardiovascular: Positive for chest pain.  All other systems reviewed and are negative.   Physical Exam Updated Vital Signs BP (!) 163/86 (BP Location: Right Arm)   Pulse 74   Temp 98 F (36.7 C) (Oral)   Resp 17   Ht 5\' 7"  (1.702 m)   Wt (!) 160.1 kg   SpO2 98%   BMI 55.29 kg/m   Physical Exam Vitals and nursing note reviewed.   85 year old female, resting comfortably and in no acute distress. Vital signs are significant for elevated blood pressure. Oxygen saturation is 98%, which is normal. Head is normocephalic and atraumatic. PERRLA, EOMI. Oropharynx is clear. Neck is nontender and supple without adenopathy or JVD. Back is nontender and there is no CVA tenderness. Lungs are clear without rales, wheezes, or rhonchi. Chest is nontender. Heart has regular rate and rhythm without murmur. Abdomen is soft, flat, nontender without masses or hepatosplenomegaly and peristalsis is normoactive. Extremities have no cyanosis or edema, full range of motion is present. Skin is warm and dry without rash. Neurologic: Mental status is normal, cranial nerves are intact, there are no motor or sensory deficits.  ED Results / Procedures / Treatments   Labs (all labs ordered are listed, but only abnormal results are displayed) Labs Reviewed  BASIC METABOLIC PANEL - Abnormal; Notable for the following components:      Result Value   Glucose, Bld 166 (*)  BUN 31 (*)    Creatinine, Ser 1.48 (*)    GFR, Estimated 32 (*)    All other components within normal limits  CBC  - Abnormal; Notable for the following components:   WBC 12.2 (*)    RBC 3.23 (*)    Hemoglobin 8.8 (*)    HCT 29.8 (*)    MCHC 29.5 (*)    All other components within normal limits  TROPONIN I (HIGH SENSITIVITY) - Abnormal; Notable for the following components:   Troponin I (High Sensitivity) 44 (*)    All other components within normal limits  TROPONIN I (HIGH SENSITIVITY) - Abnormal; Notable for the following components:   Troponin I (High Sensitivity) 81 (*)    All other components within normal limits  RESPIRATORY PANEL BY RT PCR (FLU A&B, COVID)  HEPARIN LEVEL (UNFRACTIONATED)  APTT  CBC    EKG EKG Interpretation  Date/Time:  Saturday February 14 2020 15:37:20 EDT Ventricular Rate:  74 PR Interval:  184 QRS Duration: 92 QT Interval:  408 QTC Calculation: 452 R Axis:   9 Text Interpretation: Normal sinus rhythm Cannot rule out Inferior infarct , age undetermined Abnormal ECG When compared with ECG of 01/24/2019, No significant change was found Confirmed by Delora Fuel ([id number]) on 02/14/2020 6:41:07 PM   Radiology DG Chest 2 View  Result Date: 02/14/2020 CLINICAL DATA:  Chest pain EXAM: CHEST - 2 VIEW COMPARISON:  Sep 08, 2019 FINDINGS: The heart size and mediastinal contours are within normal limits. Both lungs are clear. There is chronic elevation of right hemidiaphragm. The visualized skeletal structures are unremarkable. IMPRESSION: No active cardiopulmonary disease. Electronically Signed   By: Abelardo Diesel M.D.   On: 02/14/2020 16:54    Procedures Procedures  CRITICAL CARE Performed by: Delora Fuel Total critical care time: 45 minutes Critical care time was exclusive of separately billable procedures and treating other patients. Critical care was necessary to treat or prevent imminent or life-threatening deterioration. Critical care was time spent personally by me on the following activities: development of treatment plan with patient and/or surrogate as well as  nursing, discussions with consultants, evaluation of patient's response to treatment, examination of patient, obtaining history from patient or surrogate, ordering and performing treatments and interventions, ordering and review of laboratory studies, ordering and review of radiographic studies, pulse oximetry and re-evaluation of patient's condition.  Medications Ordered in ED Medications  nitroGLYCERIN (NITROGLYN) 2 % ointment 1 inch (has no administration in time range)  heparin ADULT infusion 100 units/mL (25000 units/280mL sodium chloride 0.45%) (has no administration in time range)  aspirin chewable tablet 324 mg (324 mg Oral Given 02/14/20 2029)    ED Course  I have reviewed the triage vital signs and the nursing notes.  Pertinent labs & imaging results that were available during my care of the patient were reviewed by me and considered in my medical decision making (see chart for details).  MDM Rules/Calculators/A&P Neck and chest discomfort, possible angina equivalent.  ECG shows no acute changes.  Chest x-ray shows no acute process.  Troponin is mildly elevated, will need to check delta troponin.  Old records are reviewed, and last cardiac catheterization was in 2012 which showed patent stents and otherwise nonobstructive disease.  She is given aspirin and will check delta troponin.  Repeat troponin has risen significantly.  She continues to be symptom-free.  She is started on heparin and topical nitrates.  Case is discussed with Dr. Eulas Post from cardiology service who agrees to  see the patient in consultation.  Case is discussed with Dr. Tobie Poet hospitalists, who agrees to admit the patient.  Final Clinical Impression(s) / ED Diagnoses Final diagnoses:  Non-STEMI (non-ST elevated myocardial infarction) ([place])  Renal insufficiency  Normochromic normocytic anemia    Rx / DC Orders ED Discharge Orders    None       Delora Fuel, MD 79/90/94 2201

## 2020-02-14 NOTE — H&P (Signed)
History and Physical   [name] EXH:[id number] DOB: 10/28/1934 DOA: 02/14/2020  PCP: [name], NP  Patient coming from: home  I have personally briefly reviewed patient's old medical records in [place].  Chief Concern: chest pain  HPI: [name] is a 85 y.o. female with medical history significant for hypertension   [name] experienced chest pain with movement/exertion, 10/10, with radiation to left upper extremity, jaw, lasting minutes, and associated shortness of breath and headache. She reports she has been having this pain for the last week and today it was the worse, prompting her to call her daughter for help. She also endorsed dry productive thick grey and/or yellow mucous in the last 2-3 months.  She reports since being in the hospital, her shortness of breath has improved and she does not know why. She reports not taking her nitroglycerin at home because she didn't know she was having a heart issue. Daughter reports she took her pain medications at home and some improvement of chest pain.   ROS was negative for changes to baseline nausea (which she associates with PO intake), abdominal pain, urinary/bowel complaints, vision changes. She denies fever, chills.   ED Course: Discussed with ED provider. Elevated HS troponin with positive delta.   Review of Systems: As per HPI otherwise 10 point review of systems negative.   Past Medical History:  Diagnosis Date  . Allergy    takes Mucinex daily as needed  . Anemia, unspecified   . Anxiety    takes Clonazepam daily as needed  . Arthritis   . Cancer (HCC)    breast  . CHF (congestive heart failure) ([place])    takes Furosemide daily  . CKD (chronic kidney disease)   . Coronary artery disease    a. s/p IMI 2004 tx with BMS to RCA;  b. s/p Promus DES to RCA 2/12 c. 06/2014 High risk stress test follow up cath 06/2014 showd patent stent--< med therapy for other mild to moedrate disease   . Coronary  atherosclerosis of native coronary artery   . Depression    takes Cymbalta daily  . Diabetes mellitus    insulin daily  . Dysuria   . GERD (gastroesophageal reflux disease)    takes Protonix daily  . Gout, unspecified    takes Colchicine daily  . Headache    occasionally  . History of blood clots about 1yrs ago   in legs-takes Coumadin   . Hyperlipidemia    takes Pravastatin daily  . Hypertension    takes Imdur and Metoprolol daily  . Insomnia   . Joint pain   . Joint swelling   . Myocardial infarction ([place]) 2004  . Numbness   . Obstructive sleep apnea    does not wear cpap  . Osteoarthritis   . Osteoarthritis   . Osteoarthrosis, unspecified whether generalized or localized, lower leg   . Pain, chronic   . Polymyalgia rheumatica ([place])   . Pulmonary emboli ([place]) 9/13   felt to need lifelong anticoagulation  . Shortness of breath dyspnea    with exertion and has Albuterol inhaler prn  . Type II or unspecified type diabetes mellitus without mention of complication, uncontrolled   . Urinary incontinence    takes Linzess daily  . Vertigo    hx of;was taking Meclizine if needed  . Wears dentures    Past Surgical History:  Procedure Laterality Date  . ABDOMINAL HYSTERECTOMY     partial  .  APPENDECTOMY    . blood clots/legs and lungs  2013  . BREAST BIOPSY Left 07/22/2014  . BREAST BIOPSY Left 02/10/2013  . BREAST LUMPECTOMY Left 11/05/2014  . BREAST LUMPECTOMY WITH RADIOACTIVE SEED LOCALIZATION Left 11/05/2014   Procedure: LEFT BREAST LUMPECTOMY WITH RADIOACTIVE SEED LOCALIZATION;  Surgeon: [name], MD;  Location: [facility];  Service: General;  Laterality: Left;  . CARDIAC CATHETERIZATION    . COLONOSCOPY    . CORONARY ANGIOPLASTY  2  . ESOPHAGOGASTRODUODENOSCOPY (EGD) WITH PROPOFOL N/A 11/07/2016   Procedure: ESOPHAGOGASTRODUODENOSCOPY (EGD) WITH PROPOFOL;  Surgeon: Gatha Mayer, MD;  Location: [facility];  Service: Endoscopy;  Laterality: N/A;  . EXCISION OF  SKIN TAG Right 11/05/2014   Procedure: EXCISION OF RIGHT EYELID SKIN TAG;  Surgeon: [name], MD;  Location: [facility];  Service: General;  Laterality: Right;  . EYE SURGERY Bilateral    cataract   . GASTRIC BYPASS  1977    reversed in 1979, [place] N/A 06/29/2014   Procedure: LEFT HEART CATHETERIZATION WITH CORONARY ANGIOGRAM;  Surgeon: Troy Sine, MD;  Location: [facility];  Service: Cardiovascular;  Laterality: N/A;  . MEMBRANE PEEL Right 10/23/2018   Procedure: MEMBRANE PEEL;  Surgeon: Hurman Horn, MD;  Location: [facility];  Service: Ophthalmology;  Laterality: Right;  . MI with stent placement  2004  . PARS PLANA VITRECTOMY Right 10/23/2018   Procedure: PARS PLANA VITRECTOMY WITH 25 GAUGE;  Surgeon: Hurman Horn, MD;  Location: [facility];  Service: Ophthalmology;  Laterality: Right;   Social History:  reports that she has never smoked. She has never used smokeless tobacco. She reports that she does not drink alcohol and does not use drugs.  Allergies  Allergen Reactions  . Sulfonamide Derivatives Swelling    Mouth swelling  . Aricept [Donepezil Hcl] Diarrhea and Nausea And Vomiting    GI upset/loose stools  . Tramadol Nausea And Vomiting   Family History  Problem Relation Age of Onset  . Breast cancer Mother 61  . Heart disease Mother   . Throat cancer Father   . Hypertension Father   . Arthritis Father   . Diabetes Father   . Arthritis Sister   . Obesity Sister   . Diabetes Sister   . Heart disease Cousin   . Colon cancer Neg Hx   . Stomach cancer Neg Hx   . Esophageal cancer Neg Hx    Family history: Family history reviewed  Prior to Admission medications   Medication Sig Start Date End Date Taking? Authorizing Provider  acetaminophen (TYLENOL) 500 MG tablet Take 1,000 mg by mouth daily.   Yes [provider]  albuterol (PROAIR HFA) 108 (90 Base) MCG/ACT inhaler Inhale 1-2 puffs into the lungs  every 6 (six) hours as needed for wheezing or shortness of breath. 12/19/18  Yes [name], NP  anastrozole (ARIMIDEX) 1 MG tablet TAKE 1 TABLET BY MOUTH EVERY DAY Patient taking differently: Take 1 mg by mouth daily.  10/22/19  Yes [name], MD  budesonide-formoterol [facility]) 160-4.5 MCG/ACT inhaler Inhale 2 puffs into the lungs 2 (two) times daily. For bronchitis Patient taking differently: Inhale 2 puffs into the lungs 2 (two) times daily as needed (shortness of breath/wheezing). For bronchitis 09/03/18  Yes [name], NP  Cyanocobalamin (VITAMIN B-12 PO) Take 1 tablet by mouth every evening.   Yes [provider]  DULoxetine (CYMBALTA) 60 MG capsule TAKE  1 CAPSULE BY MOUTH EVERY DAY FOR ANXIETY Patient taking differently: Take 60 mg by mouth daily.  10/22/19  Yes [name], NP  ergocalciferol (VITAMIN D2) 1.25 MG (50000 UT) capsule Take 1 capsule (50,000 Units total) by mouth once a week. Patient taking differently: Take 50,000 Units by mouth every Thursday.  07/28/19  Yes [name], MD  febuxostat (ULORIC) 40 MG tablet TAKE 1 TABLET BY MOUTH EVERY DAY Patient taking differently: Take 40 mg by mouth daily.  10/23/19  Yes [name], NP  fluticasone (FLONASE) 50 MCG/ACT nasal spray Place 1 spray into both nostrils in the morning and at bedtime. Patient taking differently: Place 1 spray into both nostrils 2 (two) times daily as needed for allergies.  09/12/19  Yes [name], NP  furosemide (LASIX) 20 MG tablet TAKE 1 TABLET BY MOUTH EVERY DAY AS NEEDED Patient taking differently: Take 20 mg by mouth daily as needed for fluid.  12/08/19  Yes [name], NP  hydrALAZINE (APRESOLINE) 50 MG tablet Take 1 tablet (50 mg total) by mouth 2 (two) times daily. 12/18/19  Yes [name], NP  hydrOXYzine (ATARAX/VISTARIL) 10 MG tablet Take 1 tablet by mouth three times daily as needed Patient taking differently: Take 10 mg by  mouth 3 (three) times daily as needed for itching.  11/27/19  Yes [name], NP  insulin aspart (NOVOLOG FLEXPEN) 100 UNIT/ML FlexPen Inject 16 Units into the skin 3 (three) times daily with meals. Max daily dose 70 units Patient taking differently: Inject 16-22 Units into the skin See admin instructions. Inject 16 units subcutaneously three times daily with meals plus adjustment for CBG 04/14/19  Yes Shamleffer, [name], MD  insulin glargine, 2 Unit Dial, (TOUJEO MAX SOLOSTAR) 300 UNIT/ML Solostar Pen Inject 38 Units into the skin daily. Patient assistance program provides 12/23/19  Yes Shamleffer, [name], MD  isosorbide mononitrate (IMDUR) 60 MG 24 hr tablet TAKE 1 TABLET BY MOUTH EVERY DAY Patient taking differently: Take 60 mg by mouth daily.  10/22/19  Yes [name], NP  loratadine (CLARITIN) 10 MG tablet Take 10 mg by mouth daily as needed for allergies.   Yes [provider]  meclizine (ANTIVERT) 25 MG tablet TAKE 1 TABLET BY MOUTH THREE TIMES DAILY AS NEEDED FOR DIZZINESS Patient taking differently: Take 25 mg by mouth daily.  09/12/19  Yes [name], NP  memantine (NAMENDA) 5 MG tablet TAKE 2 TABLETS BY MOUTH EVERY MORNING and TAKE 1 TABLET EVERY EVENING FOR memory Patient taking differently: Take 5-10 mg by mouth See admin instructions. Take 2 tablets (10mg  totally) by mouth in the morning; then take 1 tablet (5 mg totally) by mouth in the evening 10/09/19  Yes [name], NP  metoprolol succinate (TOPROL-XL) 50 MG 24 hr tablet Take 50 mg by mouth daily. 07/18/19  Yes [provider]  nitroGLYCERIN (NITROSTAT) 0.4 MG SL tablet Take one tablet under the tongue every 5 minutes as needed for chest pain Patient taking differently: Place 0.4 mg under the tongue every 5 (five) minutes as needed for chest pain.  09/24/13  Yes [name], NP  pantoprazole (PROTONIX) 40 MG tablet TAKE 1 TABLET BY MOUTH EVERY DAY Patient taking  differently: Take 40 mg by mouth daily.  12/18/19  Yes [name], NP  pravastatin (PRAVACHOL) 20 MG tablet TAKE 1 TABLET BY MOUTH EVERY DAY FOR cholesterol Patient taking differently: Take 20 mg by mouth  at bedtime.  10/23/19  Yes [name], NP  pregabalin (LYRICA) 25 MG capsule Take 1 capsule (25 mg total) by mouth 2 (two) times daily. 09/12/19  Yes [name], NP  sodium polystyrene (SPS) 15 GM/60ML suspension TAKE 15 grams (60 mls) BY MOUTH ONCE WEEKLY TO keep potassium down Patient taking differently: Take 15 g by mouth every Thursday.  11/17/19  Yes [name], NP  topiramate (TOPAMAX) 25 MG tablet Take 1 tablet (25 mg total) by mouth daily. Patient taking differently: Take 25 mg by mouth at bedtime.  10/22/19  Yes Eubanks, Carlos American, NP  XARELTO 10 MG TABS tablet TAKE 1 TABLET BY MOUTH EVERY DAY Patient taking differently: Take 10 mg by mouth daily.  12/18/19  Yes [name], NP  cetirizine (ZYRTEC) 10 MG tablet Take 1 tablet (10 mg total) by mouth daily. Patient not taking: Reported on 02/14/2020 01/29/20   [name], NP  Continuous Blood Gluc Sensor (FREESTYLE LIBRE 14 DAY SENSOR) MISC USE UNIT TO TEST FOUR TIMES DAILY, BEFORE MEALS AND AT BEDTIME. 01/13/20   [name], NP   Physical Exam: Vitals:   02/14/20 1543 02/14/20 1804 02/14/20 2027 02/14/20 2030  BP: (!) 184/83 (!) 163/86 (!) 165/67 (!) 158/83  Pulse: 74 74 69 65  Resp: 18 17 19 20   Temp: 98.6 F (37 C) 98 F (36.7 C)    TempSrc: Oral Oral    SpO2: 99% 98% 98% 98%  Weight: (!) 160.1 kg     Height: 5\' 7"  (1.702 m)      Constitutional: NAD, calm, comfortable Eyes: PERRL, lids and conjunctivae normal ENMT: Mucous membranes are moist. Posterior pharynx clear of any exudate or lesions.Normal dentition.  Neck: normal, supple, no masses, no thyromegaly Respiratory: clear to auscultation bilaterally, no wheezing, no crackles. Normal respiratory effort. No accessory muscle use.    Cardiovascular: Regular rate and rhythm, no murmurs / rubs / gallops. No extremity edema. 2+ pedal pulses. No carotid bruits.  Abdomen: obese abdomen with panus, no tenderness, no masses palpated. No hepatosplenomegaly. Bowel sounds difficult to auscultate. Old vertical and lateral abdominal scars  Musculoskeletal: no clubbing / cyanosis. No joint deformity upper and lower extremities. Good ROM, no contractures. Normal muscle tone.  Skin: no rashes, lesions, ulcers. No induration Neurologic: CN 2-12 grossly intact. Sensation intact, DTR normal. Strength 5/5 in all 4.  Psychiatric: Normal judgment and insight. Alert and oriented x 3. Normal mood.   Labs on Admission: I have personally reviewed following labs and imaging studies  CBC: Recent Labs  Lab 02/14/20 1624  WBC 12.2*  HGB 8.8*  HCT 29.8*  MCV 92.3  PLT 010   Basic Metabolic Panel: Recent Labs  Lab 02/14/20 1624  NA 141  K 4.5  CL 106  CO2 25  GLUCOSE 166*  BUN 31*  CREATININE 1.48*  CALCIUM 9.0   GFR: Estimated Creatinine Clearance: 45.9 mL/min (A) (by C-G formula based on SCr of 1.48 mg/dL (H)).  Urine analysis:    Component Value Date/Time   COLORURINE YELLOW 01/29/2019 1309   APPEARANCEUR HAZY (A) 01/29/2019 1309   LABSPEC 1.016 01/29/2019 1309   PHURINE 5.0 01/29/2019 1309   GLUCOSEU >=500 (A) 01/29/2019 1309   HGBUR NEGATIVE 01/29/2019 1309   BILIRUBINUR neg 06/03/2019 1116   KETONESUR NEGATIVE 01/29/2019 1309   PROTEINUR Negative 06/03/2019 1116   PROTEINUR 30 (A) 01/29/2019 1309   UROBILINOGEN 0.2 06/03/2019 1116   UROBILINOGEN 0.2 02/16/2018 1514  NITRITE neg 06/03/2019 1116   NITRITE NEGATIVE 01/29/2019 1309   LEUKOCYTESUR Moderate (2+) (A) 06/03/2019 1116   LEUKOCYTESUR MODERATE (A) 01/29/2019 1309   Radiological Exams on Admission: Personally reviewed and I agree with radiologist reading as below.  DG Chest 2 View  Result Date: 02/14/2020 CLINICAL DATA:  Chest pain EXAM: CHEST - 2 VIEW  COMPARISON:  Sep 08, 2019 FINDINGS: The heart size and mediastinal contours are within normal limits. Both lungs are clear. There is chronic elevation of right hemidiaphragm. The visualized skeletal structures are unremarkable. IMPRESSION: No active cardiopulmonary disease. Electronically Signed   By: Abelardo Diesel M.D.   On: 02/14/2020 16:54   EKG: Independently reviewed, showing normal sinus rhythm with rate of 72, no ST-T wave elevation   Assessment/Plan  Ms. Gilles is an 85 year old female with remote history of DVT/PE on xarelto,   Principal Problem:   Chest pain on exertion Active Problems:   Hyperlipidemia   Essential hypertension   History of deep venous thrombosis   Long term current use of anticoagulant therapy   Debility   OSA (obstructive sleep apnea)   COPD (chronic obstructive pulmonary disease) ([place])   # NSTEMI -  - Admit to observation with cardiac telemetry - Heparin gtt  - Pharmacy consult for heparin - Nitroglycerin ointment 1 inch  - NPO - Day team to call cardiology in the AM for evaluation  # Hypertension - resumed home medications - She may benefit from ace inhibitor  - outpatient consideration recommended  # IDDM - resumed basal insulin of 38 units in the ED  - A1c is 8.9 in August 2021 - Patient is npo pending cardiology evaluation, no SSI coverage  # COPD - low suspicion for exacerbation at this time - Resumed home inhaler medications  # Remote history of DVT and PE in 2013 - Has been on Xarelto 10 mg daily since 2013, outpatient evaluation for continued need of xarelto - Family and patient does not know why she is on xarelto - [employer] as patient is no heparin gtt   # h/o gastric bypass surgery   DVT prophylaxis: heparin gtt Code Status: DNR Diet: NPO, pending cardiology evaluation Family Communication: discussed with daughter, Arville Go at bedside Disposition Plan: pending cardiology evaluation Consults called: cardiology service called  in ED Admission status: observation, cardiac telemetry  Nelma Phagan N Leron Stoffers D.O. [facility]  If 7AM-7PM, please contact day-coverage provider [email or web address]  02/14/2020, 10:43 PM

## 2020-02-15 ENCOUNTER — Observation Stay (HOSPITAL_COMMUNITY): Payer: PPO

## 2020-02-15 DIAGNOSIS — Z86718 Personal history of other venous thrombosis and embolism: Secondary | ICD-10-CM | POA: Diagnosis not present

## 2020-02-15 DIAGNOSIS — D72829 Elevated white blood cell count, unspecified: Secondary | ICD-10-CM | POA: Diagnosis present

## 2020-02-15 DIAGNOSIS — E1142 Type 2 diabetes mellitus with diabetic polyneuropathy: Secondary | ICD-10-CM | POA: Diagnosis present

## 2020-02-15 DIAGNOSIS — Z885 Allergy status to narcotic agent status: Secondary | ICD-10-CM | POA: Diagnosis not present

## 2020-02-15 DIAGNOSIS — M109 Gout, unspecified: Secondary | ICD-10-CM | POA: Diagnosis present

## 2020-02-15 DIAGNOSIS — Z882 Allergy status to sulfonamides status: Secondary | ICD-10-CM | POA: Diagnosis not present

## 2020-02-15 DIAGNOSIS — I10 Essential (primary) hypertension: Secondary | ICD-10-CM | POA: Diagnosis not present

## 2020-02-15 DIAGNOSIS — Z23 Encounter for immunization: Secondary | ICD-10-CM | POA: Diagnosis present

## 2020-02-15 DIAGNOSIS — I130 Hypertensive heart and chronic kidney disease with heart failure and stage 1 through stage 4 chronic kidney disease, or unspecified chronic kidney disease: Secondary | ICD-10-CM | POA: Diagnosis present

## 2020-02-15 DIAGNOSIS — I214 Non-ST elevation (NSTEMI) myocardial infarction: Principal | ICD-10-CM

## 2020-02-15 DIAGNOSIS — D631 Anemia in chronic kidney disease: Secondary | ICD-10-CM | POA: Diagnosis present

## 2020-02-15 DIAGNOSIS — E785 Hyperlipidemia, unspecified: Secondary | ICD-10-CM | POA: Diagnosis present

## 2020-02-15 DIAGNOSIS — R5381 Other malaise: Secondary | ICD-10-CM | POA: Diagnosis not present

## 2020-02-15 DIAGNOSIS — I5032 Chronic diastolic (congestive) heart failure: Secondary | ICD-10-CM | POA: Diagnosis present

## 2020-02-15 DIAGNOSIS — R778 Other specified abnormalities of plasma proteins: Secondary | ICD-10-CM | POA: Diagnosis not present

## 2020-02-15 DIAGNOSIS — N179 Acute kidney failure, unspecified: Secondary | ICD-10-CM | POA: Diagnosis present

## 2020-02-15 DIAGNOSIS — I25118 Atherosclerotic heart disease of native coronary artery with other forms of angina pectoris: Secondary | ICD-10-CM | POA: Diagnosis present

## 2020-02-15 DIAGNOSIS — Z6841 Body Mass Index (BMI) 40.0 and over, adult: Secondary | ICD-10-CM | POA: Diagnosis not present

## 2020-02-15 DIAGNOSIS — C50919 Malignant neoplasm of unspecified site of unspecified female breast: Secondary | ICD-10-CM | POA: Diagnosis present

## 2020-02-15 DIAGNOSIS — Z20822 Contact with and (suspected) exposure to covid-19: Secondary | ICD-10-CM | POA: Diagnosis present

## 2020-02-15 DIAGNOSIS — F0390 Unspecified dementia without behavioral disturbance: Secondary | ICD-10-CM | POA: Diagnosis present

## 2020-02-15 DIAGNOSIS — N1832 Chronic kidney disease, stage 3b: Secondary | ICD-10-CM | POA: Diagnosis present

## 2020-02-15 DIAGNOSIS — T82855A Stenosis of coronary artery stent, initial encounter: Secondary | ICD-10-CM | POA: Diagnosis present

## 2020-02-15 DIAGNOSIS — Z86711 Personal history of pulmonary embolism: Secondary | ICD-10-CM | POA: Diagnosis not present

## 2020-02-15 DIAGNOSIS — R079 Chest pain, unspecified: Secondary | ICD-10-CM | POA: Diagnosis not present

## 2020-02-15 DIAGNOSIS — I2510 Atherosclerotic heart disease of native coronary artery without angina pectoris: Secondary | ICD-10-CM | POA: Diagnosis not present

## 2020-02-15 DIAGNOSIS — Z9884 Bariatric surgery status: Secondary | ICD-10-CM | POA: Diagnosis not present

## 2020-02-15 DIAGNOSIS — Z66 Do not resuscitate: Secondary | ICD-10-CM | POA: Diagnosis present

## 2020-02-15 HISTORY — DX: Non-ST elevation (NSTEMI) myocardial infarction: I21.4

## 2020-02-15 LAB — ECHOCARDIOGRAM COMPLETE
Area-P 1/2: 3.6 cm2
Height: 67 in
Weight: 5648 oz

## 2020-02-15 LAB — LIPID PANEL
Cholesterol: 117 mg/dL (ref 0–200)
HDL: 40 mg/dL — ABNORMAL LOW (ref >40)
LDL Cholesterol: 60 mg/dL (ref 0–99)
Total CHOL/HDL Ratio: 2.9 RATIO
Triglycerides: 85 mg/dL (ref <150)
VLDL: 17 mg/dL (ref 0–40)

## 2020-02-15 LAB — CBC
HCT: 29.5 % — ABNORMAL LOW (ref 36.0–46.0)
Hemoglobin: 8.7 g/dL — ABNORMAL LOW (ref 12.0–15.0)
MCH: 27.4 pg (ref 26.0–34.0)
MCHC: 29.5 g/dL — ABNORMAL LOW (ref 30.0–36.0)
MCV: 92.8 fL (ref 80.0–100.0)
Platelets: 277 10*3/uL (ref 150–400)
RBC: 3.18 MIL/uL — ABNORMAL LOW (ref 3.87–5.11)
RDW: 14.5 % (ref 11.5–15.5)
WBC: 15.7 10*3/uL — ABNORMAL HIGH (ref 4.0–10.5)
nRBC: 0 % (ref 0.0–0.2)

## 2020-02-15 LAB — TROPONIN I (HIGH SENSITIVITY): Troponin I (High Sensitivity): 242 ng/L (ref <18)

## 2020-02-15 LAB — RESPIRATORY PANEL BY RT PCR (FLU A&B, COVID)
Influenza A by PCR: NEGATIVE
Influenza B by PCR: NEGATIVE
SARS Coronavirus 2 by RT PCR: NEGATIVE

## 2020-02-15 LAB — APTT
aPTT: 141 seconds — ABNORMAL HIGH (ref 24–36)
aPTT: 89 seconds — ABNORMAL HIGH (ref 24–36)
aPTT: 86 seconds — ABNORMAL HIGH (ref 24–36)

## 2020-02-15 LAB — GLUCOSE, CAPILLARY
Glucose-Capillary: 327 mg/dL — ABNORMAL HIGH (ref 70–99)
Glucose-Capillary: 200 mg/dL — ABNORMAL HIGH (ref 70–99)

## 2020-02-15 LAB — HEPARIN LEVEL (UNFRACTIONATED): Heparin Unfractionated: >2.2 IU/mL — ABNORMAL HIGH (ref 0.30–0.70)

## 2020-02-15 MED ORDER — ISOSORBIDE MONONITRATE ER 60 MG PO TB24
60.0000 mg | ORAL_TABLET | Freq: Every day | ORAL | Status: DC
  Administered 2020-02-15 – 2020-02-18 (×4): 60 mg via ORAL
  Filled 2020-02-15 (×2): qty 1
  Filled 2020-02-15: qty 2
  Filled 2020-02-15: qty 1

## 2020-02-15 MED ORDER — ACETAMINOPHEN 325 MG PO TABS
650.0000 mg | ORAL_TABLET | ORAL | Status: DC | PRN
  Administered 2020-02-15 – 2020-02-17 (×4): 650 mg via ORAL
  Filled 2020-02-15 (×5): qty 2

## 2020-02-15 MED ORDER — GUAIFENESIN 100 MG/5ML PO SOLN
5.0000 mL | ORAL | Status: DC | PRN
  Administered 2020-02-15 – 2020-02-16 (×2): 100 mg via ORAL
  Filled 2020-02-15 (×2): qty 5

## 2020-02-15 MED ORDER — TOPIRAMATE 25 MG PO TABS
25.0000 mg | ORAL_TABLET | Freq: Every day | ORAL | Status: DC
  Administered 2020-02-16 – 2020-02-18 (×3): 25 mg via ORAL
  Filled 2020-02-15 (×3): qty 1

## 2020-02-15 MED ORDER — PERFLUTREN LIPID MICROSPHERE
1.0000 mL | INTRAVENOUS | Status: AC | PRN
  Administered 2020-02-15: 2 mL via INTRAVENOUS
  Filled 2020-02-15: qty 10

## 2020-02-15 MED ORDER — INSULIN ASPART 100 UNIT/ML ~~LOC~~ SOLN
0.0000 [IU] | Freq: Every day | SUBCUTANEOUS | Status: DC
  Administered 2020-02-17: 2 [IU] via SUBCUTANEOUS

## 2020-02-15 MED ORDER — MEMANTINE HCL 5 MG PO TABS
5.0000 mg | ORAL_TABLET | Freq: Every day | ORAL | Status: DC
  Administered 2020-02-15 – 2020-02-17 (×3): 5 mg via ORAL
  Filled 2020-02-15 (×4): qty 1

## 2020-02-15 MED ORDER — MOMETASONE FURO-FORMOTEROL FUM 200-5 MCG/ACT IN AERO
2.0000 | INHALATION_SPRAY | Freq: Two times a day (BID) | RESPIRATORY_TRACT | Status: DC
  Administered 2020-02-15 – 2020-02-18 (×6): 2 via RESPIRATORY_TRACT
  Filled 2020-02-15 (×2): qty 8.8

## 2020-02-15 MED ORDER — PRAVASTATIN SODIUM 40 MG PO TABS
20.0000 mg | ORAL_TABLET | Freq: Every day | ORAL | Status: DC
  Administered 2020-02-15 – 2020-02-17 (×3): 20 mg via ORAL
  Filled 2020-02-15 (×3): qty 1

## 2020-02-15 MED ORDER — FUROSEMIDE 20 MG PO TABS: 20.0000 mg | ORAL_TABLET | Freq: Every day | ORAL | Status: DC | PRN

## 2020-02-15 MED ORDER — DULOXETINE HCL 60 MG PO CPEP
60.0000 mg | ORAL_CAPSULE | Freq: Every day | ORAL | Status: DC
  Administered 2020-02-15 – 2020-02-18 (×4): 60 mg via ORAL
  Filled 2020-02-15 (×5): qty 1

## 2020-02-15 MED ORDER — FLUTICASONE PROPIONATE 50 MCG/ACT NA SUSP: 1.0000 | Freq: Two times a day (BID) | NASAL | Status: DC | PRN

## 2020-02-15 MED ORDER — INSULIN GLARGINE 100 UNIT/ML ~~LOC~~ SOLN
38.0000 [IU] | Freq: Every day | SUBCUTANEOUS | Status: DC
  Administered 2020-02-15 – 2020-02-18 (×4): 38 [IU] via SUBCUTANEOUS
  Filled 2020-02-15 (×5): qty 0.38

## 2020-02-15 MED ORDER — PANTOPRAZOLE SODIUM 40 MG PO TBEC
40.0000 mg | DELAYED_RELEASE_TABLET | Freq: Every day | ORAL | Status: DC
  Administered 2020-02-15 – 2020-02-18 (×4): 40 mg via ORAL
  Filled 2020-02-15 (×4): qty 1

## 2020-02-15 MED ORDER — HYDRALAZINE HCL 50 MG PO TABS
50.0000 mg | ORAL_TABLET | Freq: Two times a day (BID) | ORAL | Status: DC
  Administered 2020-02-15 – 2020-02-18 (×7): 50 mg via ORAL
  Filled 2020-02-15 (×7): qty 1

## 2020-02-15 MED ORDER — ANASTROZOLE 1 MG PO TABS
1.0000 mg | ORAL_TABLET | Freq: Every day | ORAL | Status: DC
  Administered 2020-02-16 – 2020-02-18 (×3): 1 mg via ORAL
  Filled 2020-02-15 (×5): qty 1

## 2020-02-15 MED ORDER — MEMANTINE HCL 10 MG PO TABS
10.0000 mg | ORAL_TABLET | Freq: Every day | ORAL | Status: DC
  Administered 2020-02-15 – 2020-02-18 (×3): 10 mg via ORAL
  Filled 2020-02-15 (×5): qty 1

## 2020-02-15 MED ORDER — FEBUXOSTAT 40 MG PO TABS
40.0000 mg | ORAL_TABLET | Freq: Every day | ORAL | Status: DC
  Administered 2020-02-15 – 2020-02-18 (×4): 40 mg via ORAL
  Filled 2020-02-15 (×4): qty 1

## 2020-02-15 MED ORDER — METOPROLOL SUCCINATE ER 50 MG PO TB24
50.0000 mg | ORAL_TABLET | Freq: Every day | ORAL | Status: DC
  Administered 2020-02-15 – 2020-02-18 (×4): 50 mg via ORAL
  Filled 2020-02-15: qty 1
  Filled 2020-02-15: qty 2
  Filled 2020-02-15 (×2): qty 1

## 2020-02-15 MED ORDER — INSULIN ASPART 100 UNIT/ML ~~LOC~~ SOLN
0.0000 [IU] | Freq: Three times a day (TID) | SUBCUTANEOUS | Status: DC
  Administered 2020-02-15: 15 [IU] via SUBCUTANEOUS
  Administered 2020-02-16 (×2): 11 [IU] via SUBCUTANEOUS
  Administered 2020-02-17: 3 [IU] via SUBCUTANEOUS
  Administered 2020-02-18: 15 [IU] via SUBCUTANEOUS
  Administered 2020-02-18: 4 [IU] via SUBCUTANEOUS

## 2020-02-15 MED ORDER — MEMANTINE HCL 5 MG PO TABS: 5.0000 mg | ORAL_TABLET | ORAL | Status: DC

## 2020-02-15 MED ORDER — ALBUTEROL SULFATE HFA 108 (90 BASE) MCG/ACT IN AERS
1.0000 | INHALATION_SPRAY | Freq: Four times a day (QID) | RESPIRATORY_TRACT | Status: DC | PRN
  Administered 2020-02-15: 2 via RESPIRATORY_TRACT
  Filled 2020-02-15: qty 6.7

## 2020-02-15 MED ORDER — ONDANSETRON HCL 4 MG/2ML IJ SOLN: 4.0000 mg | Freq: Four times a day (QID) | INTRAMUSCULAR | Status: DC | PRN

## 2020-02-15 NOTE — Progress Notes (Signed)
[place] for heparin Indication: chest pain/ACS  Allergies  Allergen Reactions  . Sulfonamide Derivatives Swelling    Mouth swelling  . Aricept [Donepezil Hcl] Diarrhea and Nausea And Vomiting    GI upset/loose stools  . Tramadol Nausea And Vomiting    Patient Measurements: Height: 5\' 7"  (170.2 cm) Weight: (!) 160.1 kg (353 lb) IBW/kg (Calculated) : 61.6 Heparin Dosing Weight: 101.9kg  Vital Signs: BP: 141/71 (10/10 0746) Pulse Rate: 78 (10/10 0746)  Labs: Recent Labs    02/14/20 1624 02/14/20 2037 02/15/20 0636  HGB 8.8*  --  8.7*  HCT 29.8*  --  29.5*  PLT 294  --  277  APTT  --   --  141*  HEPARINUNFRC  --   --  >2.20*  CREATININE 1.48*  --   --   TROPONINIHS 44* 81*  --     Estimated Creatinine Clearance: 45.9 mL/min (A) (by C-G formula based on SCr of 1.48 mg/dL (H)).   Assessment: 60 yof presented to the ED with CP. Troponin mildly elevated. She is on chronic low dose Xarelto. To transition to IV heparin. Baseline Hgb is low at 8.8 and platelets are WNL. No bleeding noted. Last dose of Xarelto was 10/9 afternoon.  Heparin level >2.2 (affected by Xarelto) so utilizing PTT for monitoring. PTT 141 sec (supratherapeutic) on gtt at 1800 units/hr. No bleeding noted.  Goal of Therapy:  Heparin level 0.3-0.7 units/ml aPTT 66-102 seconds Monitor platelets by anticoagulation protocol: Yes   Plan:  Decrease heparin gtt to 1550 units/hr F/u 8 hr PTT Plan for cath on Monday  Sherlon Handing, PharmD, BCPS Please see amion for complete clinical pharmacist phone list 02/15/2020,10:46 AM

## 2020-02-15 NOTE — Progress Notes (Signed)
PROGRESS NOTE    [name]  YSA:[id number] DOB: 12-03-1934 DOA: 02/14/2020 PCP: [name], NP    Brief Narrative: 85 year old female with history of CAD, history of PE and DVT on Xarelto at home type 2 diabetes, diastolic heart failure, GERD, anemia, breast cancer and morbid obesity admitted with 10 out of 10 chest pain multiple times radiating to her left upper extremity and jaw.  This is associated with dyspnea on exertion and worsening shortness of breath.  She has chronic pain and shoulder pain, but she felt that this pain is different from her regular shoulder pains. At baseline she moves around inside the house with a walker and has a wheelchair. She lives at home with her daughter.  Assessment & Plan:   Principal Problem:   Chest pain on exertion Active Problems:   Hyperlipidemia   Essential hypertension   History of deep venous thrombosis   Long term current use of anticoagulant therapy   Debility   OSA (obstructive sleep apnea)   COPD (chronic obstructive pulmonary disease) (HCC)  #1 NSTEMI-patient has been started on heparin drip and nitroglycerin ointment.  Currently she is pain-free. Troponin Seen by cardiology planning for further coronary evaluation tomorrow. Meanwhile continue heparin drip. Continue aspirin and atorvastatin and Toprol Continue Imdur which she was taking at home. Echocardiogram today  #2 type 2 diabetes complicated with neuropathy on Cymbalta A1c of 8.9 in August 2021 On long-acting insulin at home continue CBG (last 3)  No results for input(s): GLUCAP in the last 72 hours. Start SSI On Lantus 38 units daily  #3 history of DVT and PE in 2013-Xarelto currently on hold since she is on heparin drip.?  If she still needs Xarelto.  #4 history of gastric bypass surgery  #5 morbid obesity  #6 history of essential hypertension-on hydralazine 50 mg twice a day, Imdur 60 mg daily, metoprolol 50 mg daily  #7 hyperlipidemia LDL of 60 on  Pravachol 20 mg daily.  #8 leukocytosis White count 15.7 from 12.2-patient complained of cough and thick yellow phlegm.  No fever.  Covid negative influenza AMB negative. Chest x-ray no active disease. Could be stress-induced.  Will monitor closely will hold off on any antibiotics at this time. She has no urinary complaints.  #9 history of gout continue febuxostat  #10 history of dementia on  Namenda  #11 chronic anemia hemoglobin stable no evidence of active bleeding monitor closely on heparin drip  #12 AKI on CKD stage IIIb  #13 history of breast cancer on Arimidex  Estimated body mass index is 55.29 kg/m as calculated from the following:   Height as of this encounter: 5\' 7"  (1.702 m).   Weight as of this encounter: 160.1 kg.  DVT prophylaxis: Heparin  code Status: DNR Family Communication: Discussed with daughter Disposition Plan:  Status is: Observation   Dispo: The patient is from: Home              Anticipated d/c is to: Home              Anticipated d/c date is: 2 days              Patient currently is not medically stable to d/c.   Consultants: Cardiology  Procedures: None Antimicrobials: None  Subjective: Patient is resting in bed she denies any chest pain presently denies any palpitations  Objective: Vitals:   02/15/20 0500 02/15/20 0530 02/15/20 0545 02/15/20 0746  BP: (!) 135/118 (!) 148/58 (!) 149/64 [name])  141/71  Pulse: 75 77 74 78  Resp: 19 20 (!) 23 10  Temp:      TempSrc:      SpO2: 98% 98% 95% 95%  Weight:      Height:       No intake or output data in the 24 hours ending 02/15/20 0859 Filed Weights   02/14/20 1543  Weight: (!) 160.1 kg    Examination:  General exam: Appears calm and comfortable  Respiratory system: Clear to auscultation. Respiratory effort normal. Cardiovascular system: S1 & S2 heard, RRR. No JVD, murmurs, rubs, gallops or clicks. No pedal edema. Gastrointestinal system: Abdomen is nondistended, soft and nontender. No  organomegaly or masses felt. Normal bowel sounds heard. Central nervous system: Alert and oriented. No focal neurological deficits. Extremities: 1+ bilateral pitting edema Skin: No rashes, lesions or ulcers Psychiatry: Judgement and insight appear normal. Mood & affect appropriate.     Data Reviewed: I have personally reviewed following labs and imaging studies  CBC: Recent Labs  Lab 02/14/20 1624 02/15/20 0636  WBC 12.2* 15.7*  HGB 8.8* 8.7*  HCT 29.8* 29.5*  MCV 92.3 92.8  PLT 294 998   Basic Metabolic Panel: Recent Labs  Lab 02/14/20 1624  NA 141  K 4.5  CL 106  CO2 25  GLUCOSE 166*  BUN 31*  CREATININE 1.48*  CALCIUM 9.0   GFR: Estimated Creatinine Clearance: 45.9 mL/min (A) (by C-G formula based on SCr of 1.48 mg/dL (H)). Liver Function Tests: No results for input(s): AST, ALT, ALKPHOS, BILITOT, PROT, ALBUMIN in the last 168 hours. No results for input(s): LIPASE, AMYLASE in the last 168 hours. No results for input(s): AMMONIA in the last 168 hours. Coagulation Profile: No results for input(s): INR, PROTIME in the last 168 hours. Cardiac Enzymes: No results for input(s): CKTOTAL, CKMB, CKMBINDEX, TROPONINI in the last 168 hours. BNP (last 3 results) No results for input(s): PROBNP in the last 8760 hours. HbA1C: No results for input(s): HGBA1C in the last 72 hours. CBG: No results for input(s): GLUCAP in the last 168 hours. Lipid Profile: Recent Labs    02/15/20 0636  CHOL 117  HDL 40*  LDLCALC 60  TRIG 85  CHOLHDL 2.9   Thyroid Function Tests: No results for input(s): TSH, T4TOTAL, FREET4, T3FREE, THYROIDAB in the last 72 hours. Anemia Panel: No results for input(s): VITAMINB12, FOLATE, FERRITIN, TIBC, IRON, RETICCTPCT in the last 72 hours. Sepsis Labs: No results for input(s): PROCALCITON, LATICACIDVEN in the last 168 hours.  Recent Results (from the past 240 hour(s))  Respiratory Panel by RT PCR (Flu A&B, Covid) - Nasopharyngeal Swab      Status: None   Collection Time: 02/14/20 10:45 PM   Specimen: Nasopharyngeal Swab  Result Value Ref Range Status   SARS Coronavirus 2 by RT PCR NEGATIVE NEGATIVE Final    Comment: (NOTE) SARS-CoV-2 target nucleic acids are NOT DETECTED.  The SARS-CoV-2 RNA is generally detectable in upper respiratoy specimens during the acute phase of infection. The lowest concentration of SARS-CoV-2 viral copies this assay can detect is 131 copies/mL. A negative result does not preclude SARS-Cov-2 infection and should not be used as the sole basis for treatment or other patient management decisions. A negative result may occur with  improper specimen collection/handling, submission of specimen other than nasopharyngeal swab, presence of viral mutation(s) within the areas targeted by this assay, and inadequate number of viral copies (<131 copies/mL). A negative result must be combined with clinical observations, patient history,  and epidemiological information. The expected result is Negative.  Fact Sheet for Patients:  PinkCheek.be  Fact Sheet for Healthcare Providers:  GravelBags.it  This test is no t yet approved or cleared by the [place] FDA and  has been authorized for detection and/or diagnosis of SARS-CoV-2 by FDA under an Emergency Use Authorization (EUA). This EUA will remain  in effect (meaning this test can be used) for the duration of the COVID-19 declaration under Section 564(b)(1) of the Act, 21 U.S.C. section 360bbb-3(b)(1), unless the authorization is terminated or revoked sooner.     Influenza A by PCR NEGATIVE NEGATIVE Final   Influenza B by PCR NEGATIVE NEGATIVE Final    Comment: (NOTE) The Xpert Xpress SARS-CoV-2/FLU/RSV assay is intended as an aid in  the diagnosis of influenza from Nasopharyngeal swab specimens and  should not be used as a sole basis for treatment. Nasal washings and  aspirates are  unacceptable for Xpert Xpress SARS-CoV-2/FLU/RSV  testing.  Fact Sheet for Patients: PinkCheek.be  Fact Sheet for Healthcare Providers: GravelBags.it  This test is not yet approved or cleared by the [place] FDA and  has been authorized for detection and/or diagnosis of SARS-CoV-2 by  FDA under an Emergency Use Authorization (EUA). This EUA will remain  in effect (meaning this test can be used) for the duration of the  Covid-19 declaration under Section 564(b)(1) of the Act, 21  U.S.C. section 360bbb-3(b)(1), unless the authorization is  terminated or revoked. Performed at [facility], [place] [address] [place], [place] [address]          Radiology Studies: DG Chest 2 View  Result Date: 02/14/2020 CLINICAL DATA:  Chest pain EXAM: CHEST - 2 VIEW COMPARISON:  Sep 08, 2019 FINDINGS: The heart size and mediastinal contours are within normal limits. Both lungs are clear. There is chronic elevation of right hemidiaphragm. The visualized skeletal structures are unremarkable. IMPRESSION: No active cardiopulmonary disease. Electronically Signed   By: Abelardo Diesel M.D.   On: 02/14/2020 16:54        Scheduled Meds: . DULoxetine  60 mg Oral Daily  . febuxostat  40 mg Oral Daily  . hydrALAZINE  50 mg Oral BID  . insulin glargine  38 Units Subcutaneous Daily  . isosorbide mononitrate  60 mg Oral Daily  . memantine  10 mg Oral Daily   And  . memantine  5 mg Oral QHS  . metoprolol succinate  50 mg Oral Daily  . mometasone-formoterol  2 puff Inhalation BID  . pantoprazole  40 mg Oral Daily  . pravastatin  20 mg Oral QHS   Continuous Infusions: . heparin 1,800 Units/hr (02/15/20 0745)     LOS: 0 days     Georgette Shell, MD  02/15/2020, 8:59 AM

## 2020-02-15 NOTE — Plan of Care (Signed)
  Problem: Cardiac: Goal: Ability to achieve and maintain adequate cardiopulmonary perfusion will improve Outcome: Progressing   

## 2020-02-15 NOTE — Progress Notes (Signed)
[place] for heparin Indication: chest pain/ACS  Allergies  Allergen Reactions  . Sulfonamide Derivatives Swelling    Mouth swelling  . Aricept [Donepezil Hcl] Diarrhea and Nausea And Vomiting    GI upset/loose stools  . Tramadol Nausea And Vomiting    Patient Measurements: Height: 5\' 7"  (170.2 cm) Weight: (!) 160.1 kg (353 lb) IBW/kg (Calculated) : 61.6 Heparin Dosing Weight: 101.9kg  Vital Signs: Temp: 98 F (36.7 C) (10/10 1947) Temp Source: Oral (10/10 1947) BP: 139/70 (10/10 1947) Pulse Rate: 86 (10/10 1947)  Labs: Recent Labs    02/14/20 1624 02/14/20 2037 02/15/20 0636 02/15/20 1144 02/15/20 1149 02/15/20 1848  HGB 8.8*  --  8.7*  --   --   --   HCT 29.8*  --  29.5*  --   --   --   PLT 294  --  277  --   --   --   APTT  --   --  141*  --  89* 86*  HEPARINUNFRC  --   --  >2.20*  --   --   --   CREATININE 1.48*  --   --   --   --   --   TROPONINIHS 44* 81*  --  242*  --   --     Estimated Creatinine Clearance: 45.9 mL/min (A) (by C-G formula based on SCr of 1.48 mg/dL (H)).   Assessment: 39 yof presented to the ED with CP. Troponin mildly elevated. She is on chronic low dose Xarelto. To transition to IV heparin. Last dose of Xarelto was 10/9 afternoon. -aPTT= 86   Goal of Therapy:  Heparin level 0.3-0.7 units/ml aPTT 66-102 seconds Monitor platelets by anticoagulation protocol: Yes   Plan:  Continue heparin at 1550 units/hr Daily heparin level and CBC Plan for cath on Monday  Hildred Laser, PharmD Clinical Pharmacist **Pharmacist phone directory can now be found on [email or web address] ([email or web address]).  Listed under [place].

## 2020-02-15 NOTE — Plan of Care (Signed)

## 2020-02-15 NOTE — Progress Notes (Signed)
  Echocardiogram 2D Echocardiogram with definity has been performed.  Darlina Sicilian M 02/15/2020, 10:49 AM

## 2020-02-16 ENCOUNTER — Ambulatory Visit: Payer: PPO

## 2020-02-16 DIAGNOSIS — R778 Other specified abnormalities of plasma proteins: Secondary | ICD-10-CM

## 2020-02-16 DIAGNOSIS — R079 Chest pain, unspecified: Secondary | ICD-10-CM

## 2020-02-16 LAB — HEPARIN LEVEL (UNFRACTIONATED): Heparin Unfractionated: 0.87 IU/mL — ABNORMAL HIGH (ref 0.30–0.70)

## 2020-02-16 LAB — HEMOGLOBIN AND HEMATOCRIT, BLOOD
HCT: 30.2 % — ABNORMAL LOW (ref 36.0–46.0)
Hemoglobin: 9.3 g/dL — ABNORMAL LOW (ref 12.0–15.0)

## 2020-02-16 LAB — GLUCOSE, CAPILLARY
Glucose-Capillary: 118 mg/dL — ABNORMAL HIGH (ref 70–99)
Glucose-Capillary: 281 mg/dL — ABNORMAL HIGH (ref 70–99)
Glucose-Capillary: 254 mg/dL — ABNORMAL HIGH (ref 70–99)
Glucose-Capillary: 163 mg/dL — ABNORMAL HIGH (ref 70–99)

## 2020-02-16 LAB — CBC
HCT: 26.1 % — ABNORMAL LOW (ref 36.0–46.0)
Hemoglobin: 7.7 g/dL — ABNORMAL LOW (ref 12.0–15.0)
MCH: 26.6 pg (ref 26.0–34.0)
MCHC: 29.5 g/dL — ABNORMAL LOW (ref 30.0–36.0)
MCV: 90.3 fL (ref 80.0–100.0)
Platelets: 277 10*3/uL (ref 150–400)
RBC: 2.89 MIL/uL — ABNORMAL LOW (ref 3.87–5.11)
RDW: 14.3 % (ref 11.5–15.5)
WBC: 14.9 10*3/uL — ABNORMAL HIGH (ref 4.0–10.5)
nRBC: 0 % (ref 0.0–0.2)

## 2020-02-16 LAB — PREPARE RBC (CROSSMATCH)

## 2020-02-16 LAB — APTT: aPTT: 125 seconds — ABNORMAL HIGH (ref 24–36)

## 2020-02-16 MED ORDER — ASPIRIN EC 81 MG PO TBEC: 81.0000 mg | DELAYED_RELEASE_TABLET | Freq: Every day | ORAL | Status: DC

## 2020-02-16 MED ORDER — OXYCODONE HCL 5 MG PO TABS
5.0000 mg | ORAL_TABLET | ORAL | Status: DC | PRN
  Administered 2020-02-16 – 2020-02-17 (×3): 5 mg via ORAL
  Filled 2020-02-16 (×3): qty 1

## 2020-02-16 MED ORDER — SODIUM CHLORIDE 0.9% IV SOLUTION: Freq: Once | INTRAVENOUS | Status: DC

## 2020-02-16 NOTE — Progress Notes (Signed)
Pt c/o sudden onset of SOB, o2 sats 95% on Rm Air, pt sates that she feels like she is "chasing her breath" 2L O2 placed for comfort, no change in breath sounds, pts voice horse, RR 20-22, T 97.6, BP 142/68 (90). Dr Zigmund Daniel made aware. Advised to monitor. pts daughter at bedside.

## 2020-02-16 NOTE — Progress Notes (Signed)
[place] for heparin Indication: chest pain/ACS  Allergies  Allergen Reactions  . Sulfonamide Derivatives Swelling    Mouth swelling  . Aricept [Donepezil Hcl] Diarrhea and Nausea And Vomiting    GI upset/loose stools  . Tramadol Nausea And Vomiting    Patient Measurements: Height: 5\' 7"  (170.2 cm) Weight: (!) 160.1 kg (353 lb) IBW/kg (Calculated) : 61.6 Heparin Dosing Weight: 101.9kg  Vital Signs: Temp: 98.2 F (36.8 C) (10/11 0325) Temp Source: Oral (10/11 0325) BP: 134/54 (10/11 0325) Pulse Rate: 78 (10/11 0325)  Labs: Recent Labs    02/14/20 1624 02/14/20 1624 02/14/20 2037 02/15/20 0636 02/15/20 0636 02/15/20 1144 02/15/20 1149 02/15/20 1848 02/16/20 0128  HGB 8.8*   < >  --  8.7*  --   --   --   --  7.7*  HCT 29.8*  --   --  29.5*  --   --   --   --  26.1*  PLT 294  --   --  277  --   --   --   --  277  APTT  --   --   --  141*   < >  --  89* 86* 125*  HEPARINUNFRC  --   --   --  >2.20*  --   --   --   --  0.87*  CREATININE 1.48*  --   --   --   --   --   --   --   --   TROPONINIHS 44*  --  81*  --   --  242*  --   --   --    < > = values in this interval not displayed.    Estimated Creatinine Clearance: 45.9 mL/min (A) (by C-G formula based on SCr of 1.48 mg/dL (H)).   Assessment: [name] presented to the ED with CP. Troponin mildly elevated. She is on chronic low dose Xarelto. To transition to IV heparin. Last dose of Xarelto was 10/9 afternoon.   APTT this morning is above goal at 125 seconds and heparin level 0.87, appear to be correlating so will dose via heparin level. H/H down slightly, no S/Sx bleeding documented.   Goal of Therapy:  Heparin level 0.3-0.7 units/ml aPTT 66-102 seconds Monitor platelets by anticoagulation protocol: Yes   Plan:  Reduce heparin to 1350 units/h Recheck heparin level in [place], PharmD, BCPS Clinical Pharmacist [phone] Please check AMION for all [place] numbers 02/16/2020

## 2020-02-16 NOTE — Plan of Care (Signed)
  Problem: Education: Goal: Understanding of cardiac disease, CV risk reduction, and recovery process will improve Outcome: Progressing Goal: Understanding of medication regimen will improve Outcome: Progressing Goal: Individualized Educational Video(s) Outcome: Progressing   Problem: Activity: Goal: Ability to tolerate increased activity will improve Outcome: Progressing   Problem: Cardiac: Goal: Ability to achieve and maintain adequate cardiopulmonary perfusion will improve Outcome: Progressing   Problem: Health Behavior/Discharge Planning: Goal: Ability to safely manage health-related needs after discharge will improve Outcome: Progressing

## 2020-02-16 NOTE — Progress Notes (Addendum)
Progress Note  Patient Name: [name] Date of Encounter: 02/16/2020  [facility] Cardiologist: Ena Dawley, MD   Subjective   No chest pain. Family at bedside, assist with some of the history.  Inpatient Medications    Scheduled Meds: . anastrozole  1 mg Oral Daily  . DULoxetine  60 mg Oral Daily  . febuxostat  40 mg Oral Daily  . hydrALAZINE  50 mg Oral BID  . insulin aspart  0-20 Units Subcutaneous TID WC  . insulin aspart  0-5 Units Subcutaneous QHS  . insulin glargine  38 Units Subcutaneous Daily  . isosorbide mononitrate  60 mg Oral Daily  . memantine  10 mg Oral Daily   And  . memantine  5 mg Oral QHS  . metoprolol succinate  50 mg Oral Daily  . mometasone-formoterol  2 puff Inhalation BID  . pantoprazole  40 mg Oral Daily  . pravastatin  20 mg Oral QHS  . topiramate  25 mg Oral Daily   Continuous Infusions: . heparin 1,350 Units/hr (02/16/20 0731)   PRN Meds: acetaminophen, albuterol, fluticasone, furosemide, guaiFENesin, ondansetron (ZOFRAN) IV   Vital Signs    Vitals:   02/15/20 1947 02/16/20 0022 02/16/20 0325 02/16/20 0730  BP: 139/70 (!) 137/54 (!) 134/54 (!) 141/60  Pulse: 86 79 78 78  Resp: 19 18 17 15   Temp: 98 F (36.7 C) 98 F (36.7 C) 98.2 F (36.8 C) 98.2 F (36.8 C)  TempSrc: Oral Oral Oral Oral  SpO2: 94% 97% 96% 98%  Weight:      Height:        Intake/Output Summary (Last 24 hours) at 02/16/2020 1011 Last data filed at 02/16/2020 0325 Gross per 24 hour  Intake --  Output 700 ml  Net -700 ml   Last 3 Weights 02/14/2020 12/23/2019 09/12/2019  Weight (lbs) 353 lb 329 lb 320 lb  Weight (kg) 160.12 kg 149.233 kg 145.151 kg      Telemetry    SR - Personally Reviewed  ECG    No new tracing.  Physical Exam  Pleasant older AAF, sitting up in bed. GEN: No acute distress.   Neck: No JVD Cardiac: RRR, no murmurs, rubs, or gallops.  Respiratory: Clear to auscultation bilaterally. GI: Soft, nontender, non-distended   MS: No edema; No deformity. Neuro:  Nonfocal  Psych: Normal affect, some confusion regarding events prior to admission.  Labs    High Sensitivity Troponin:   Recent Labs  Lab 02/14/20 1624 02/14/20 2037 02/15/20 1144  TROPONINIHS 44* 81* 242*      Chemistry Recent Labs  Lab 02/14/20 1624  NA 141  K 4.5  CL 106  CO2 25  GLUCOSE 166*  BUN 31*  CREATININE 1.48*  CALCIUM 9.0  GFRNONAA 32*  ANIONGAP 10     Hematology Recent Labs  Lab 02/14/20 1624 02/15/20 0636 02/16/20 0128  WBC 12.2* 15.7* 14.9*  RBC 3.23* 3.18* 2.89*  HGB 8.8* 8.7* 7.7*  HCT 29.8* 29.5* 26.1*  MCV 92.3 92.8 90.3  MCH 27.2 27.4 26.6  MCHC 29.5* 29.5* 29.5*  RDW 14.2 14.5 14.3  PLT 294 277 277    BNPNo results for input(s): BNP, PROBNP in the last 168 hours.   DDimer No results for input(s): DDIMER in the last 168 hours.   Radiology    DG Chest 2 View  Result Date: 02/14/2020 CLINICAL DATA:  Chest pain EXAM: CHEST - 2 VIEW COMPARISON:  Sep 08, 2019 FINDINGS: The heart size  and mediastinal contours are within normal limits. Both lungs are clear. There is chronic elevation of right hemidiaphragm. The visualized skeletal structures are unremarkable. IMPRESSION: No active cardiopulmonary disease. Electronically Signed   By: Abelardo Diesel M.D.   On: 02/14/2020 16:54   ECHOCARDIOGRAM COMPLETE  Result Date: 02/15/2020    ECHOCARDIOGRAM REPORT   Patient Name:   [name] Date of Exam: 02/15/2020 Medical Rec #:  [id number]       Height:       67.0 in Accession #:    [id number]      Weight:       353.0 lb Date of Birth:  [birth year]       BSA:          2.575 m Patient Age:    85 years        BP:           89/50 mmHg Patient Gender: F               HR:           77 bpm. Exam Location:  Inpatient Procedure: 2D Echo and Intracardiac Opacification Agent Indications:    Abnormal ECG 794.31 / R94.31  History:        Patient has prior history of Echocardiogram examinations, most                 recent  10/09/2017. CHF, CAD and Previous Myocardial Infarction;                 Risk Factors:Diabetes, Hypertension and Dyslipidemia. Chronic                 kidney disease, GERD, breast cancer.  Sonographer:    Darlina Sicilian RDCS Referring Phys: [id number] [place] Comments: No parasternal window and no subcostal window. Image acquisition challenging due to patient body habitus. IMPRESSIONS  1. Technically difficult echo with poor image quality.  2. Left ventricular ejection fraction, by estimation, is 65 to 70%. The left ventricle has normal function. The left ventricle has no regional wall motion abnormalities. Left ventricular diastolic parameters are consistent with Grade I diastolic dysfunction (impaired relaxation).  3. Right ventricular systolic function was not well visualized. The right ventricular size is not well visualized.  4. The mitral valve was not well visualized. No evidence of mitral valve regurgitation.  5. The aortic valve was not well visualized. Aortic valve regurgitation is not visualized. FINDINGS  Left Ventricle: Left ventricular ejection fraction, by estimation, is 65 to 70%. The left ventricle has normal function. The left ventricle has no regional wall motion abnormalities. Definity contrast agent was given IV to delineate the left ventricular  endocardial borders. The left ventricular internal cavity size was normal in size. There is no left ventricular hypertrophy. Left ventricular diastolic parameters are consistent with Grade I diastolic dysfunction (impaired relaxation). Right Ventricle: The right ventricular size is not well visualized. Right vetricular wall thickness was not well visualized. Right ventricular systolic function was not well visualized. Left Atrium: Left atrial size was normal in size. Right Atrium: Right atrial size was normal in size. Pericardium: There is no evidence of pericardial effusion. Mitral Valve: The mitral valve was not well visualized. No  evidence of mitral valve regurgitation. Tricuspid Valve: The tricuspid valve is not well visualized. Tricuspid valve regurgitation is not demonstrated. Aortic Valve: The aortic valve was not well visualized. Aortic valve regurgitation is not visualized. Pulmonic Valve: The pulmonic valve was  not well visualized. Pulmonic valve regurgitation is not visualized. Aorta: The aortic root was not well visualized. IAS/Shunts: The atrial septum is grossly normal. Additional Comments: Technically difficult echo with poor image quality.   Diastology LV e' medial:    3.70 cm/s LV E/e' medial:  19.7 LV e' lateral:   6.53 cm/s LV E/e' lateral: 11.1  LEFT ATRIUM             Index LA Vol (A2C):   24.4 ml 9.48 ml/m LA Vol (A4C):   35.7 ml 13.87 ml/m LA Biplane Vol: 30.3 ml 11.77 ml/m  AORTIC VALVE LVOT Vmax:   93.70 cm/s LVOT Vmean:  73.200 cm/s LVOT VTI:    0.158 m MITRAL VALVE MV Area (PHT): 3.60 cm     SHUNTS MV Decel Time: 211 msec     Systemic VTI: 0.16 m MV E velocity: 72.80 cm/s MV A velocity: 119.00 cm/s MV E/A ratio:  0.61 Mertie Moores MD Electronically signed by Mertie Moores MD Signature Date/Time: 02/15/2020/1:23:56 PM    Final     Cardiac Studies   Echo: 02/15/20  IMPRESSIONS    1. Technically difficult echo with poor image quality.  2. Left ventricular ejection fraction, by estimation, is 65 to 70%. The  left ventricle has normal function. The left ventricle has no regional  wall motion abnormalities. Left ventricular diastolic parameters are  consistent with Grade I diastolic  dysfunction (impaired relaxation).  3. Right ventricular systolic function was not well visualized. The right  ventricular size is not well visualized.  4. The mitral valve was not well visualized. No evidence of mitral valve  regurgitation.  5. The aortic valve was not well visualized. Aortic valve regurgitation  is not visualized.   Patient Profile     85 y.o. female with PMH of morbid obesity, CAD (s/p BMS  to RCA 2004, s/p DES to RCA 2016), HFpEF, DM2, GERD, chronic pain, prior breast CA and anemia presents with chest pain and was found to have elevated troponin.  Assessment & Plan    1. NSTEMI: she is a difficult historian, hard to determine timing of symptoms or any associated symptoms with her chest discomfort. She is bed/wheelchair bound requiring a lot of assistance with ADLs. She has not had any recurrent chest pain while admitted. hsTn peaked at 242, EKG without acute ST/T wave changes on admission. Has been on IV heparin. Echo with normal EF and no rWMA noted. With her declining H/H would not favor proceeding with cardiac cath today. Best option may be to treat medically.  -- follow H/H trend, if no recurrent chest pain would favor no invasive work up -- on statin, Imdur and BB  2. CAD s/p BMS to RCA 2004, s/p DES to RCA 2016: Prior to events on admission, she says she does not recall any anginal symptoms.   3. Anemia: baseline appears around 9-10. Has noted a decline since admission to 7.7 today. Has been on heparin around 36 hours, consider DC with falling H/H. -- denies any dark tarry stools prior to admission, reports normal colonoscopy in the past   4. DM: Hgb A1c 8.9 -- SSI while inpatient  5. Dementia: on namenda. She is able to recall some events surrounding her chest pain but specifics are unclear.   6. PE/DVT: has been on Xarelto 10mg  daily. Question whether this can be stopped?  For questions or updates, please contact [place] Please consult [email or web address] for contact info under  Signed, Reino Bellis, NP  02/16/2020, 10:11 AM    I have personally seen and examined this patient. I agree with the assessment and plan as outlined above.  She has no chest pain today. Mild elevation troponin. Known history of CAD. I agree that she is not the best candidate for a cardiac cath today given her anemia with falling H/H. No ischemic EKG changes. Echo with normal LV  systolic function with no wall motion abnormalities. Given her advanced age and anemia, would prefer conservative approach from a cardiac standpoint for now. Discussed this with the patient and her daughter.  Further evaluation of anemia per primary team.  - I would suggest stopping her heparin drip and repeating H/H in the am -? If she needs to remain on Xarelto long term.   [name] 02/16/2020 10:48 AM

## 2020-02-16 NOTE — Progress Notes (Signed)
PROGRESS NOTE    [name]  YKD:[id number] DOB: 1935/04/09 DOA: 02/14/2020 PCP: [name], NP    Brief Narrative: 85 year old female with history of CAD, history of PE and DVT on Xarelto at home type 2 diabetes, diastolic heart failure, GERD, anemia, breast cancer and morbid obesity admitted with 10 out of 10 chest pain multiple times radiating to her left upper extremity and jaw.  This is associated with dyspnea on exertion and worsening shortness of breath.  She has chronic pain and shoulder pain, but she felt that this pain is different from her regular shoulder pains. At baseline she moves around inside the house with a walker and has a wheelchair. She lives at home with her daughter.  Assessment & Plan:   Principal Problem:   Chest pain on exertion Active Problems:   Hyperlipidemia   Essential hypertension   History of deep venous thrombosis   Long term current use of anticoagulant therapy   Debility   OSA (obstructive sleep apnea)   COPD (chronic obstructive pulmonary disease) (HCC)   NSTEMI (non-ST elevated myocardial infarction) ([facility])  #1 NSTEMI-patient has been chest pain-free since admission.  She had been started on heparin drip.  Seen by cardiology today recommends to DC heparin.  With her chronic comorbidities and advanced age They recommend conservative management. Troponin peaked at 242.   On aspirin atorvastatin and Toprol and Imdur.  Echocardiogram with normal ejection fraction no regional wall motion abnormalities.   #2 type 2 diabetes complicated with neuropathy on Cymbalta A1c of 8.9 in August 2021 On long-acting insulin at home continue CBG (last 3)  Recent Labs    02/15/20 2054 02/16/20 0608 02/16/20 1053  GLUCAP 200* 118* 281*   Start SSI On Lantus 38 units daily  #3 history of DVT and PE in 2013-Xarelto currently on hold since she is on heparin drip.?  If she still needs Xarelto.  Will check with Dr. Irene Limbo.  #4 history of gastric  bypass surgery  #5 morbid obesity  #6 history of essential hypertension-blood pressure 137/65.  Continue hydralazine Imdur and metoprolol.   #7 hyperlipidemia LDL of 60 on Pravachol 20 mg daily.  #8 leukocytosis White count 14.9 from 15.7 from 12.2-patient complained of cough and thick yellow phlegm.  No fever.  Covid negative influenza AMB negative. Chest x-ray no active disease. Could be stress-induced.  Will monitor closely will hold off on any antibiotics at this time. She has no urinary complaints.  #9 history of gout continue febuxostat  #10 history of dementia on  Namenda  #11 chronic anemia hemoglobin 7.7 down from 8.7 yesterday.  She has no evidence of active bleeding.  Check FOBT.  DC heparin drip.  Will check with oncologist to see if she needs to be on Xarelto.  Will transfuse 1 unit of packed RBC.  #12 AKI on CKD stage IIIb stable  #13 history of breast cancer on Arimidex  Estimated body mass index is 55.29 kg/m as calculated from the following:   Height as of this encounter: 5\' 7"  (1.702 m).   Weight as of this encounter: 160.1 kg.  DVT prophylaxis: Heparin  code Status: DNR Family Communication: Discussed with daughter Disposition Plan:  Status is: Inpatient  Dispo: The patient is from: Home              Anticipated d/c is to: Home              Anticipated d/c date is: 2 days  Patient currently is not medically stable to d/c.   Consultants: Cardiology  Procedures: None Antimicrobials: None  Subjective: She is resting in bed and trying to eat breakfast her daughter by the bedside.  She denies any further chest pain since admission.  Remains on heparin drip which will be stopped soon.  Objective: Vitals:   02/16/20 0022 02/16/20 0325 02/16/20 0730 02/16/20 1054  BP: (!) 137/54 (!) 134/54 (!) 141/60 137/65  Pulse: 79 78 78 81  Resp: 18 17 15 20   Temp: 98 F (36.7 C) 98.2 F (36.8 C) 98.2 F (36.8 C) 98.3 F (36.8 C)  TempSrc: Oral  Oral Oral Oral  SpO2: 97% 96% 98% 95%  Weight:      Height:        Intake/Output Summary (Last 24 hours) at 02/16/2020 1138 Last data filed at 02/16/2020 0325 Gross per 24 hour  Intake --  Output 700 ml  Net -700 ml   Filed Weights   02/14/20 1543  Weight: (!) 160.1 kg    Examination:  General exam: Appears calm and comfortable  Respiratory system: Clear to auscultation. Respiratory effort normal. Cardiovascular system: S1 & S2 heard, RRR. No JVD, murmurs, rubs, gallops or clicks. No pedal edema. Gastrointestinal system: Abdomen is nondistended, soft and nontender. No organomegaly or masses felt. Normal bowel sounds heard. Central nervous system: Alert and oriented. No focal neurological deficits. Extremities: 1+ bilateral pitting edema Skin: No rashes, lesions or ulcers Psychiatry: Judgement and insight appear normal. Mood & affect appropriate.     Data Reviewed: I have personally reviewed following labs and imaging studies  CBC: Recent Labs  Lab 02/14/20 1624 02/15/20 0636 02/16/20 0128  WBC 12.2* 15.7* 14.9*  HGB 8.8* 8.7* 7.7*  HCT 29.8* 29.5* 26.1*  MCV 92.3 92.8 90.3  PLT 294 277 629   Basic Metabolic Panel: Recent Labs  Lab 02/14/20 1624  NA 141  K 4.5  CL 106  CO2 25  GLUCOSE 166*  BUN 31*  CREATININE 1.48*  CALCIUM 9.0   GFR: Estimated Creatinine Clearance: 45.9 mL/min (A) (by C-G formula based on SCr of 1.48 mg/dL (H)). Liver Function Tests: No results for input(s): AST, ALT, ALKPHOS, BILITOT, PROT, ALBUMIN in the last 168 hours. No results for input(s): LIPASE, AMYLASE in the last 168 hours. No results for input(s): AMMONIA in the last 168 hours. Coagulation Profile: No results for input(s): INR, PROTIME in the last 168 hours. Cardiac Enzymes: No results for input(s): CKTOTAL, CKMB, CKMBINDEX, TROPONINI in the last 168 hours. BNP (last 3 results) No results for input(s): PROBNP in the last 8760 hours. HbA1C: No results for input(s):  HGBA1C in the last 72 hours. CBG: Recent Labs  Lab 02/15/20 1648 02/15/20 2054 02/16/20 0608 02/16/20 1053  GLUCAP 327* 200* 118* 281*   Lipid Profile: Recent Labs    02/15/20 0636  CHOL 117  HDL 40*  LDLCALC 60  TRIG 85  CHOLHDL 2.9   Thyroid Function Tests: No results for input(s): TSH, T4TOTAL, FREET4, T3FREE, THYROIDAB in the last 72 hours. Anemia Panel: No results for input(s): VITAMINB12, FOLATE, FERRITIN, TIBC, IRON, RETICCTPCT in the last 72 hours. Sepsis Labs: No results for input(s): PROCALCITON, LATICACIDVEN in the last 168 hours.  Recent Results (from the past 240 hour(s))  Respiratory Panel by RT PCR (Flu A&B, Covid) - Nasopharyngeal Swab     Status: None   Collection Time: 02/14/20 10:45 PM   Specimen: Nasopharyngeal Swab  Result Value Ref Range Status   SARS  Coronavirus 2 by RT PCR NEGATIVE NEGATIVE Final    Comment: (NOTE) SARS-CoV-2 target nucleic acids are NOT DETECTED.  The SARS-CoV-2 RNA is generally detectable in upper respiratoy specimens during the acute phase of infection. The lowest concentration of SARS-CoV-2 viral copies this assay can detect is 131 copies/mL. A negative result does not preclude SARS-Cov-2 infection and should not be used as the sole basis for treatment or other patient management decisions. A negative result may occur with  improper specimen collection/handling, submission of specimen other than nasopharyngeal swab, presence of viral mutation(s) within the areas targeted by this assay, and inadequate number of viral copies (<131 copies/mL). A negative result must be combined with clinical observations, patient history, and epidemiological information. The expected result is Negative.  Fact Sheet for Patients:  PinkCheek.be  Fact Sheet for Healthcare Providers:  GravelBags.it  This test is no t yet approved or cleared by the [place] FDA and  has been  authorized for detection and/or diagnosis of SARS-CoV-2 by FDA under an Emergency Use Authorization (EUA). This EUA will remain  in effect (meaning this test can be used) for the duration of the COVID-19 declaration under Section 564(b)(1) of the Act, 21 U.S.C. section 360bbb-3(b)(1), unless the authorization is terminated or revoked sooner.     Influenza A by PCR NEGATIVE NEGATIVE Final   Influenza B by PCR NEGATIVE NEGATIVE Final    Comment: (NOTE) The Xpert Xpress SARS-CoV-2/FLU/RSV assay is intended as an aid in  the diagnosis of influenza from Nasopharyngeal swab specimens and  should not be used as a sole basis for treatment. Nasal washings and  aspirates are unacceptable for Xpert Xpress SARS-CoV-2/FLU/RSV  testing.  Fact Sheet for Patients: PinkCheek.be  Fact Sheet for Healthcare Providers: GravelBags.it  This test is not yet approved or cleared by the [place] FDA and  has been authorized for detection and/or diagnosis of SARS-CoV-2 by  FDA under an Emergency Use Authorization (EUA). This EUA will remain  in effect (meaning this test can be used) for the duration of the  Covid-19 declaration under Section 564(b)(1) of the Act, 21  U.S.C. section 360bbb-3(b)(1), unless the authorization is  terminated or revoked. Performed at [facility], [place] [address], [place], [place] [address]          Radiology Studies: DG Chest 2 View  Result Date: 02/14/2020 CLINICAL DATA:  Chest pain EXAM: CHEST - 2 VIEW COMPARISON:  Sep 08, 2019 FINDINGS: The heart size and mediastinal contours are within normal limits. Both lungs are clear. There is chronic elevation of right hemidiaphragm. The visualized skeletal structures are unremarkable. IMPRESSION: No active cardiopulmonary disease. Electronically Signed   By: Abelardo Diesel M.D.   On: 02/14/2020 16:54   ECHOCARDIOGRAM COMPLETE  Result Date: 02/15/2020     ECHOCARDIOGRAM REPORT   Patient Name:   [name] Date of Exam: 02/15/2020 Medical Rec #:  [id number]       Height:       67.0 in Accession #:    [id number]      Weight:       353.0 lb Date of Birth:  [birth year]       BSA:          2.575 m Patient Age:    85 years        BP:           89/50 mmHg Patient Gender: F  HR:           77 bpm. Exam Location:  Inpatient Procedure: 2D Echo and Intracardiac Opacification Agent Indications:    Abnormal ECG 794.31 / R94.31  History:        Patient has prior history of Echocardiogram examinations, most                 recent 10/09/2017. CHF, CAD and Previous Myocardial Infarction;                 Risk Factors:Diabetes, Hypertension and Dyslipidemia. Chronic                 kidney disease, GERD, breast cancer.  Sonographer:    Darlina Sicilian RDCS Referring Phys: [id number] [place] Comments: No parasternal window and no subcostal window. Image acquisition challenging due to patient body habitus. IMPRESSIONS  1. Technically difficult echo with poor image quality.  2. Left ventricular ejection fraction, by estimation, is 65 to 70%. The left ventricle has normal function. The left ventricle has no regional wall motion abnormalities. Left ventricular diastolic parameters are consistent with Grade I diastolic dysfunction (impaired relaxation).  3. Right ventricular systolic function was not well visualized. The right ventricular size is not well visualized.  4. The mitral valve was not well visualized. No evidence of mitral valve regurgitation.  5. The aortic valve was not well visualized. Aortic valve regurgitation is not visualized. FINDINGS  Left Ventricle: Left ventricular ejection fraction, by estimation, is 65 to 70%. The left ventricle has normal function. The left ventricle has no regional wall motion abnormalities. Definity contrast agent was given IV to delineate the left ventricular  endocardial borders. The left ventricular internal  cavity size was normal in size. There is no left ventricular hypertrophy. Left ventricular diastolic parameters are consistent with Grade I diastolic dysfunction (impaired relaxation). Right Ventricle: The right ventricular size is not well visualized. Right vetricular wall thickness was not well visualized. Right ventricular systolic function was not well visualized. Left Atrium: Left atrial size was normal in size. Right Atrium: Right atrial size was normal in size. Pericardium: There is no evidence of pericardial effusion. Mitral Valve: The mitral valve was not well visualized. No evidence of mitral valve regurgitation. Tricuspid Valve: The tricuspid valve is not well visualized. Tricuspid valve regurgitation is not demonstrated. Aortic Valve: The aortic valve was not well visualized. Aortic valve regurgitation is not visualized. Pulmonic Valve: The pulmonic valve was not well visualized. Pulmonic valve regurgitation is not visualized. Aorta: The aortic root was not well visualized. IAS/Shunts: The atrial septum is grossly normal. Additional Comments: Technically difficult echo with poor image quality.   Diastology LV e' medial:    3.70 cm/s LV E/e' medial:  19.7 LV e' lateral:   6.53 cm/s LV E/e' lateral: 11.1  LEFT ATRIUM             Index LA Vol (A2C):   24.4 ml 9.48 ml/m LA Vol (A4C):   35.7 ml 13.87 ml/m LA Biplane Vol: 30.3 ml 11.77 ml/m  AORTIC VALVE LVOT Vmax:   93.70 cm/s LVOT Vmean:  73.200 cm/s LVOT VTI:    0.158 m MITRAL VALVE MV Area (PHT): 3.60 cm     SHUNTS MV Decel Time: 211 msec     Systemic VTI: 0.16 m MV E velocity: 72.80 cm/s MV A velocity: 119.00 cm/s MV E/A ratio:  0.61 Mertie Moores MD Electronically signed by Mertie Moores MD Signature Date/Time: 02/15/2020/1:23:56 PM  Final         Scheduled Meds: . anastrozole  1 mg Oral Daily  . DULoxetine  60 mg Oral Daily  . febuxostat  40 mg Oral Daily  . hydrALAZINE  50 mg Oral BID  . insulin aspart  0-20 Units Subcutaneous TID WC    . insulin aspart  0-5 Units Subcutaneous QHS  . insulin glargine  38 Units Subcutaneous Daily  . isosorbide mononitrate  60 mg Oral Daily  . memantine  10 mg Oral Daily   And  . memantine  5 mg Oral QHS  . metoprolol succinate  50 mg Oral Daily  . mometasone-formoterol  2 puff Inhalation BID  . pantoprazole  40 mg Oral Daily  . pravastatin  20 mg Oral QHS  . topiramate  25 mg Oral Daily   Continuous Infusions: . heparin 1,350 Units/hr (02/16/20 0731)     LOS: 1 day     Georgette Shell, MD  02/16/2020, 11:38 AM

## 2020-02-17 ENCOUNTER — Encounter (HOSPITAL_COMMUNITY)
Admission: EM | Disposition: A | Discharge status: Home Health | Payer: Self-pay | Source: Home / Self Care | Attending: Internal Medicine

## 2020-02-17 ENCOUNTER — Inpatient Hospital Stay (HOSPITAL_COMMUNITY): Payer: PPO

## 2020-02-17 DIAGNOSIS — I2510 Atherosclerotic heart disease of native coronary artery without angina pectoris: Secondary | ICD-10-CM

## 2020-02-17 DIAGNOSIS — Z955 Presence of coronary angioplasty implant and graft: Secondary | ICD-10-CM

## 2020-02-17 HISTORY — PX: CORONARY STENT INTERVENTION: CATH118234

## 2020-02-17 HISTORY — PX: LEFT HEART CATH AND CORONARY ANGIOGRAPHY: CATH118249

## 2020-02-17 LAB — TYPE AND SCREEN
ABO/RH(D): A POS
Antibody Screen: NEGATIVE
Unit division: 0

## 2020-02-17 LAB — CBC
HCT: 29.6 % — ABNORMAL LOW (ref 36.0–46.0)
Hemoglobin: 9 g/dL — ABNORMAL LOW (ref 12.0–15.0)
MCH: 27.1 pg (ref 26.0–34.0)
MCHC: 30.4 g/dL (ref 30.0–36.0)
MCV: 89.2 fL (ref 80.0–100.0)
Platelets: 268 10*3/uL (ref 150–400)
RBC: 3.32 MIL/uL — ABNORMAL LOW (ref 3.87–5.11)
RDW: 14.5 % (ref 11.5–15.5)
WBC: 12.1 10*3/uL — ABNORMAL HIGH (ref 4.0–10.5)
nRBC: 0 % (ref 0.0–0.2)

## 2020-02-17 LAB — POCT ACTIVATED CLOTTING TIME
Activated Clotting Time: 312 seconds
Activated Clotting Time: 252 seconds
Activated Clotting Time: 268 seconds

## 2020-02-17 LAB — BPAM RBC
Blood Product Expiration Date: 202111012359
ISSUE DATE / TIME: 202110111552
Unit Type and Rh: 6200

## 2020-02-17 LAB — GLUCOSE, CAPILLARY
Glucose-Capillary: 128 mg/dL — ABNORMAL HIGH (ref 70–99)
Glucose-Capillary: 201 mg/dL — ABNORMAL HIGH (ref 70–99)
Glucose-Capillary: 248 mg/dL — ABNORMAL HIGH (ref 70–99)

## 2020-02-17 LAB — BASIC METABOLIC PANEL
Anion gap: 9 (ref 5–15)
BUN: 33 mg/dL — ABNORMAL HIGH (ref 8–23)
CO2: 23 mmol/L (ref 22–32)
Calcium: 8.8 mg/dL — ABNORMAL LOW (ref 8.9–10.3)
Chloride: 105 mmol/L (ref 98–111)
Creatinine, Ser: 1.59 mg/dL — ABNORMAL HIGH (ref 0.44–1.00)
GFR, Estimated: 30 mL/min — ABNORMAL LOW (ref >60)
Glucose, Bld: 212 mg/dL — ABNORMAL HIGH (ref 70–99)
Potassium: 4.7 mmol/L (ref 3.5–5.1)
Sodium: 137 mmol/L (ref 135–145)

## 2020-02-17 LAB — TROPONIN I (HIGH SENSITIVITY): Troponin I (High Sensitivity): 162 ng/L (ref <18)

## 2020-02-17 SURGERY — LEFT HEART CATH AND CORONARY ANGIOGRAPHY
Anesthesia: LOCAL

## 2020-02-17 MED ORDER — SODIUM CHLORIDE 0.9 % IV SOLN
INTRAVENOUS | Status: AC | PRN
  Administered 2020-02-17: 160 mL/h via INTRAVENOUS

## 2020-02-17 MED ORDER — LIDOCAINE HCL (PF) 1 % IJ SOLN
INTRAMUSCULAR | Status: DC | PRN
  Administered 2020-02-17: 5 mL

## 2020-02-17 MED ORDER — DICLOFENAC SODIUM 1 % EX GEL
2.0000 g | Freq: Three times a day (TID) | CUTANEOUS | Status: DC
  Administered 2020-02-17 – 2020-02-18 (×2): 2 g via TOPICAL
  Filled 2020-02-17: qty 100

## 2020-02-17 MED ORDER — SODIUM CHLORIDE 0.9 % IV SOLN: 250.0000 mL | INTRAVENOUS | Status: DC | PRN

## 2020-02-17 MED ORDER — VERAPAMIL HCL 2.5 MG/ML IV SOLN
INTRAVENOUS | Status: AC
  Filled 2020-02-17: qty 2

## 2020-02-17 MED ORDER — LIDOCAINE HCL (PF) 1 % IJ SOLN
INTRAMUSCULAR | Status: AC
  Filled 2020-02-17: qty 30

## 2020-02-17 MED ORDER — FENTANYL CITRATE (PF) 100 MCG/2ML IJ SOLN
INTRAMUSCULAR | Status: DC | PRN
Start: 2020-02-17 — End: 2020-02-17
  Administered 2020-02-17 (×4): 25 ug via INTRAVENOUS

## 2020-02-17 MED ORDER — HEPARIN SODIUM (PORCINE) 5000 UNIT/ML IJ SOLN
5000.0000 [IU] | Freq: Three times a day (TID) | INTRAMUSCULAR | Status: DC
  Administered 2020-02-17 – 2020-02-18 (×4): 5000 [IU] via SUBCUTANEOUS
  Filled 2020-02-17 (×4): qty 1

## 2020-02-17 MED ORDER — SODIUM CHLORIDE 0.9% FLUSH: 3.0000 mL | INTRAVENOUS | Status: DC | PRN

## 2020-02-17 MED ORDER — VERAPAMIL HCL 2.5 MG/ML IV SOLN
INTRAVENOUS | Status: DC | PRN
  Administered 2020-02-17: 10 mL via INTRA_ARTERIAL

## 2020-02-17 MED ORDER — SODIUM CHLORIDE 0.9 % WEIGHT BASED INFUSION
3.0000 mL/kg/h | INTRAVENOUS | Status: AC
  Administered 2020-02-17: 3 mL/kg/h via INTRAVENOUS

## 2020-02-17 MED ORDER — CLOPIDOGREL BISULFATE 300 MG PO TABS
ORAL_TABLET | ORAL | Status: DC | PRN
  Administered 2020-02-17: 600 mg via ORAL

## 2020-02-17 MED ORDER — HEPARIN (PORCINE) IN NACL 1000-0.9 UT/500ML-% IV SOLN
INTRAVENOUS | Status: AC
  Filled 2020-02-17: qty 1000

## 2020-02-17 MED ORDER — SODIUM CHLORIDE 0.9 % IV SOLN: INTRAVENOUS | Status: AC

## 2020-02-17 MED ORDER — MIDAZOLAM HCL 2 MG/2ML IJ SOLN
INTRAMUSCULAR | Status: DC | PRN
  Administered 2020-02-17 (×2): 1 mg via INTRAVENOUS

## 2020-02-17 MED ORDER — FAMOTIDINE IN NACL 20-0.9 MG/50ML-% IV SOLN
INTRAVENOUS | Status: AC
  Filled 2020-02-17: qty 50

## 2020-02-17 MED ORDER — SODIUM CHLORIDE 0.9% FLUSH: 3.0000 mL | Freq: Two times a day (BID) | INTRAVENOUS | Status: DC

## 2020-02-17 MED ORDER — SODIUM CHLORIDE 0.9 % WEIGHT BASED INFUSION: 1.0000 mL/kg/h | INTRAVENOUS | Status: DC

## 2020-02-17 MED ORDER — LABETALOL HCL 5 MG/ML IV SOLN: 10.0000 mg | INTRAVENOUS | Status: AC | PRN

## 2020-02-17 MED ORDER — FAMOTIDINE IN NACL 20-0.9 MG/50ML-% IV SOLN
INTRAVENOUS | Status: AC | PRN
  Administered 2020-02-17: 20 mg via INTRAVENOUS

## 2020-02-17 MED ORDER — ASPIRIN EC 81 MG PO TBEC
81.0000 mg | DELAYED_RELEASE_TABLET | Freq: Every day | ORAL | Status: DC
  Administered 2020-02-17 – 2020-02-18 (×2): 81 mg via ORAL
  Filled 2020-02-17 (×2): qty 1

## 2020-02-17 MED ORDER — CLOPIDOGREL BISULFATE 300 MG PO TABS
ORAL_TABLET | ORAL | Status: AC
  Filled 2020-02-17: qty 1

## 2020-02-17 MED ORDER — MIDAZOLAM HCL 2 MG/2ML IJ SOLN
INTRAMUSCULAR | Status: AC
  Filled 2020-02-17: qty 2

## 2020-02-17 MED ORDER — HEPARIN SODIUM (PORCINE) 1000 UNIT/ML IJ SOLN
INTRAMUSCULAR | Status: DC | PRN
  Administered 2020-02-17: 3000 [IU] via INTRAVENOUS
  Administered 2020-02-17: 7000 [IU] via INTRAVENOUS
  Administered 2020-02-17: 8000 [IU] via INTRAVENOUS

## 2020-02-17 MED ORDER — HEPARIN (PORCINE) IN NACL 1000-0.9 UT/500ML-% IV SOLN
INTRAVENOUS | Status: DC | PRN
  Administered 2020-02-17 (×2): 500 mL

## 2020-02-17 MED ORDER — CLOPIDOGREL BISULFATE 75 MG PO TABS: 75.0000 mg | ORAL_TABLET | Freq: Every day | ORAL | Status: DC

## 2020-02-17 MED ORDER — TICAGRELOR 90 MG PO TABS
ORAL_TABLET | ORAL | Status: AC
  Filled 2020-02-17: qty 1

## 2020-02-17 MED ORDER — IOHEXOL 350 MG/ML SOLN
INTRAVENOUS | Status: DC | PRN
  Administered 2020-02-17: 110 mL

## 2020-02-17 MED ORDER — FENTANYL CITRATE (PF) 100 MCG/2ML IJ SOLN
INTRAMUSCULAR | Status: AC
Start: 2020-02-17 — End: ?
  Filled 2020-02-17: qty 2

## 2020-02-17 MED ORDER — HYDRALAZINE HCL 20 MG/ML IJ SOLN: 10.0000 mg | INTRAMUSCULAR | Status: AC | PRN

## 2020-02-17 MED ORDER — HEPARIN SODIUM (PORCINE) 1000 UNIT/ML IJ SOLN
INTRAMUSCULAR | Status: AC
  Filled 2020-02-17: qty 1

## 2020-02-17 SURGICAL SUPPLY — 23 items
BALLN  ~~LOC~~ SAPPHIRE 4.5X12 (BALLOONS) ×2
BALLN SAPPHIRE 3.0X12 (BALLOONS) ×2
BALLN SAPPHIRE ~~LOC~~ 3.5X15 (BALLOONS) ×1 IMPLANT
BALLN SAPPHIRE ~~LOC~~ 4.0X12 (BALLOONS) ×1 IMPLANT
BALLN ~~LOC~~ SAPPHIRE 4.5X12 (BALLOONS) ×1
BALLOON SAPPHIRE 3.0X12 (BALLOONS) IMPLANT
BALLOON ~~LOC~~ SAPPHIRE 4.5X12 (BALLOONS) IMPLANT
CATH OPTITORQUE TIG 4.0 5F (CATHETERS) ×1 IMPLANT
CATH VISTA GUIDE 6FR JR4 (CATHETERS) ×1 IMPLANT
DEVICE RAD COMP TR BAND LRG (VASCULAR PRODUCTS) ×1 IMPLANT
GLIDESHEATH SLEND A-KIT 6F 22G (SHEATH) IMPLANT
GLIDESHEATH SLEND SS 6F .021 (SHEATH) ×1 IMPLANT
GUIDEWIRE INQWIRE 1.5J.035X260 (WIRE) IMPLANT
INQWIRE 1.5J .035X260CM (WIRE) ×2
KIT ENCORE 26 ADVANTAGE (KITS) ×1 IMPLANT
KIT HEART LEFT (KITS) ×2 IMPLANT
PACK CARDIAC CATHETERIZATION (CUSTOM PROCEDURE TRAY) ×2 IMPLANT
SHEATH PROBE COVER 6X72 (BAG) ×1 IMPLANT
STENT RESOLUTE ONYX 3.0X18 (Permanent Stent) ×1 IMPLANT
STENT RESOLUTE ONYX 4.0X18 (Permanent Stent) ×1 IMPLANT
TRANSDUCER W/STOPCOCK (MISCELLANEOUS) ×2 IMPLANT
TUBING CIL FLEX 10 FLL-RA (TUBING) ×2 IMPLANT
WIRE ASAHI PROWATER 180CM (WIRE) ×1 IMPLANT

## 2020-02-17 NOTE — Plan of Care (Signed)
  Problem: Education: Goal: Understanding of cardiac disease, CV risk reduction, and recovery process will improve Outcome: Progressing Goal: Understanding of medication regimen will improve Outcome: Progressing Goal: Individualized Educational Video(s) Outcome: Progressing   Problem: Activity: Goal: Ability to tolerate increased activity will improve Outcome: Progressing   Problem: Cardiac: Goal: Ability to achieve and maintain adequate cardiopulmonary perfusion will improve Outcome: Progressing   Problem: Health Behavior/Discharge Planning: Goal: Ability to safely manage health-related needs after discharge will improve Outcome: Progressing   Problem: Education: Goal: Understanding of CV disease, CV risk reduction, and recovery process will improve Outcome: Progressing Goal: Individualized Educational Video(s) Outcome: Progressing   Problem: Activity: Goal: Ability to return to baseline activity level will improve Outcome: Progressing   Problem: Cardiovascular: Goal: Ability to achieve and maintain adequate cardiovascular perfusion will improve Outcome: Progressing Goal: Vascular access site(s) Level 0-1 will be maintained Outcome: Progressing   Problem: Health Behavior/Discharge Planning: Goal: Ability to safely manage health-related needs after discharge will improve Outcome: Progressing   Problem: Education: Goal: Knowledge of General Education information will improve Description: Including pain rating scale, medication(s)/side effects and non-pharmacologic comfort measures Outcome: Progressing   Problem: Health Behavior/Discharge Planning: Goal: Ability to manage health-related needs will improve Outcome: Progressing   Problem: Clinical Measurements: Goal: Ability to maintain clinical measurements within normal limits will improve Outcome: Progressing Goal: Will remain free from infection Outcome: Progressing Goal: Diagnostic test results will  improve Outcome: Progressing Goal: Respiratory complications will improve Outcome: Progressing Goal: Cardiovascular complication will be avoided Outcome: Progressing   Problem: Activity: Goal: Risk for activity intolerance will decrease Outcome: Progressing   Problem: Nutrition: Goal: Adequate nutrition will be maintained Outcome: Progressing   Problem: Coping: Goal: Level of anxiety will decrease Outcome: Progressing   Problem: Elimination: Goal: Will not experience complications related to bowel motility Outcome: Progressing Goal: Will not experience complications related to urinary retention Outcome: Progressing   Problem: Pain Managment: Goal: General experience of comfort will improve Outcome: Progressing   Problem: Safety: Goal: Ability to remain free from injury will improve Outcome: Progressing   Problem: Skin Integrity: Goal: Risk for impaired skin integrity will decrease Outcome: Progressing

## 2020-02-17 NOTE — H&P (View-Only) (Signed)
Progress Note  Patient Name: [name] Date of Encounter: 02/17/2020  [facility] Cardiologist: Ena Dawley, MD   Subjective   Has been having intermittent episodes of chest pain during the evening and this morning, feels heavy with radiation into her neck and associated shortness of breath.   Inpatient Medications    Scheduled Meds: . sodium chloride   Intravenous Once  . anastrozole  1 mg Oral Daily  . aspirin EC  81 mg Oral Daily  . diclofenac Sodium  2 g Topical TID  . DULoxetine  60 mg Oral Daily  . febuxostat  40 mg Oral Daily  . hydrALAZINE  50 mg Oral BID  . insulin aspart  0-20 Units Subcutaneous TID WC  . insulin aspart  0-5 Units Subcutaneous QHS  . insulin glargine  38 Units Subcutaneous Daily  . isosorbide mononitrate  60 mg Oral Daily  . memantine  10 mg Oral Daily   And  . memantine  5 mg Oral QHS  . metoprolol succinate  50 mg Oral Daily  . mometasone-formoterol  2 puff Inhalation BID  . pantoprazole  40 mg Oral Daily  . pravastatin  20 mg Oral QHS  . topiramate  25 mg Oral Daily   Continuous Infusions:  PRN Meds: acetaminophen, albuterol, fluticasone, furosemide, guaiFENesin, ondansetron (ZOFRAN) IV, oxyCODONE   Vital Signs    Vitals:   02/16/20 2036 02/16/20 2300 02/17/20 0326 02/17/20 0859  BP: 140/66 (!) 145/66 140/72 (!) 121/49  Pulse: 75 77 78 81  Resp: 18 20 17    Temp: 98 F (36.7 C) 97.9 F (36.6 C) 98.3 F (36.8 C) (!) 97.5 F (36.4 C)  TempSrc: Oral Oral Oral Oral  SpO2: 93% 92% 99% 95%  Weight:      Height:        Intake/Output Summary (Last 24 hours) at 02/17/2020 1015 Last data filed at 02/17/2020 0326 Gross per 24 hour  Intake 368.58 ml  Output 1150 ml  Net -781.42 ml   Last 3 Weights 02/14/2020 12/23/2019 09/12/2019  Weight (lbs) 353 lb 329 lb 320 lb  Weight (kg) 160.12 kg 149.233 kg 145.151 kg      Telemetry    SR - Personally Reviewed  ECG    SR with no acute ST/T wave changes - Personally  Reviewed  Physical Exam  Pleasant older AAF GEN: No acute distress.   Neck: No JVD Cardiac: RRR, no murmurs, rubs, or gallops.  Respiratory: Clear to auscultation bilaterally. GI: Soft, nontender, non-distended  MS: No edema; No deformity. Neuro:  Nonfocal  Psych: Normal affect   Labs    High Sensitivity Troponin:   Recent Labs  Lab 02/14/20 1624 02/14/20 2037 02/15/20 1144  TROPONINIHS 44* 81* 242*      Chemistry Recent Labs  Lab 02/14/20 1624  NA 141  K 4.5  CL 106  CO2 25  GLUCOSE 166*  BUN 31*  CREATININE 1.48*  CALCIUM 9.0  GFRNONAA 32*  ANIONGAP 10     Hematology Recent Labs  Lab 02/15/20 0636 02/15/20 0636 02/16/20 0128 02/16/20 2116 02/17/20 0125  WBC 15.7*  --  14.9*  --  12.1*  RBC 3.18*  --  2.89*  --  3.32*  HGB 8.7*   < > 7.7* 9.3* 9.0*  HCT 29.5*   < > 26.1* 30.2* 29.6*  MCV 92.8  --  90.3  --  89.2  MCH 27.4  --  26.6  --  27.1  MCHC 29.5*  --  29.5*  --  30.4  RDW 14.5  --  14.3  --  14.5  PLT 277  --  277  --  268   < > = values in this interval not displayed.    BNPNo results for input(s): BNP, PROBNP in the last 168 hours.   DDimer No results for input(s): DDIMER in the last 168 hours.   Radiology    ECHOCARDIOGRAM COMPLETE  Result Date: 02/15/2020    ECHOCARDIOGRAM REPORT   Patient Name:   MADDIX KLIEWER Date of Exam: 02/15/2020 Medical Rec #:  [id number]       Height:       67.0 in Accession #:    [id number]      Weight:       353.0 lb Date of Birth:  December 18, 1934       BSA:          2.575 m Patient Age:    85 years        BP:           89/50 mmHg Patient Gender: F               HR:           77 bpm. Exam Location:  Inpatient Procedure: 2D Echo and Intracardiac Opacification Agent Indications:    Abnormal ECG 794.31 / R94.31  History:        Patient has prior history of Echocardiogram examinations, most                 recent 10/09/2017. CHF, CAD and Previous Myocardial Infarction;                 Risk Factors:Diabetes,  Hypertension and Dyslipidemia. Chronic                 kidney disease, GERD, breast cancer.  Sonographer:    Darlina Sicilian RDCS Referring Phys: [id number] [place] Comments: No parasternal window and no subcostal window. Image acquisition challenging due to patient body habitus. IMPRESSIONS  1. Technically difficult echo with poor image quality.  2. Left ventricular ejection fraction, by estimation, is 65 to 70%. The left ventricle has normal function. The left ventricle has no regional wall motion abnormalities. Left ventricular diastolic parameters are consistent with Grade I diastolic dysfunction (impaired relaxation).  3. Right ventricular systolic function was not well visualized. The right ventricular size is not well visualized.  4. The mitral valve was not well visualized. No evidence of mitral valve regurgitation.  5. The aortic valve was not well visualized. Aortic valve regurgitation is not visualized. FINDINGS  Left Ventricle: Left ventricular ejection fraction, by estimation, is 65 to 70%. The left ventricle has normal function. The left ventricle has no regional wall motion abnormalities. Definity contrast agent was given IV to delineate the left ventricular  endocardial borders. The left ventricular internal cavity size was normal in size. There is no left ventricular hypertrophy. Left ventricular diastolic parameters are consistent with Grade I diastolic dysfunction (impaired relaxation). Right Ventricle: The right ventricular size is not well visualized. Right vetricular wall thickness was not well visualized. Right ventricular systolic function was not well visualized. Left Atrium: Left atrial size was normal in size. Right Atrium: Right atrial size was normal in size. Pericardium: There is no evidence of pericardial effusion. Mitral Valve: The mitral valve was not well visualized. No evidence of mitral valve regurgitation. Tricuspid Valve: The tricuspid valve is not well  visualized. Tricuspid  valve regurgitation is not demonstrated. Aortic Valve: The aortic valve was not well visualized. Aortic valve regurgitation is not visualized. Pulmonic Valve: The pulmonic valve was not well visualized. Pulmonic valve regurgitation is not visualized. Aorta: The aortic root was not well visualized. IAS/Shunts: The atrial septum is grossly normal. Additional Comments: Technically difficult echo with poor image quality.   Diastology LV e' medial:    3.70 cm/s LV E/e' medial:  19.7 LV e' lateral:   6.53 cm/s LV E/e' lateral: 11.1  LEFT ATRIUM             Index LA Vol (A2C):   24.4 ml 9.48 ml/m LA Vol (A4C):   35.7 ml 13.87 ml/m LA Biplane Vol: 30.3 ml 11.77 ml/m  AORTIC VALVE LVOT Vmax:   93.70 cm/s LVOT Vmean:  73.200 cm/s LVOT VTI:    0.158 m MITRAL VALVE MV Area (PHT): 3.60 cm     SHUNTS MV Decel Time: 211 msec     Systemic VTI: 0.16 m MV E velocity: 72.80 cm/s MV A velocity: 119.00 cm/s MV E/A ratio:  0.61 Mertie Moores MD Electronically signed by Mertie Moores MD Signature Date/Time: 02/15/2020/1:23:56 PM    Final     Cardiac Studies   Echo: 02/15/20  IMPRESSIONS    1. Technically difficult echo with poor image quality.  2. Left ventricular ejection fraction, by estimation, is 65 to 70%. The  left ventricle has normal function. The left ventricle has no regional  wall motion abnormalities. Left ventricular diastolic parameters are  consistent with Grade I diastolic  dysfunction (impaired relaxation).  3. Right ventricular systolic function was not well visualized. The right  ventricular size is not well visualized.  4. The mitral valve was not well visualized. No evidence of mitral valve  regurgitation.  5. The aortic valve was not well visualized. Aortic valve regurgitation  is not visualized.   Patient Profile     85 y.o. female with PMH of morbid obesity, CAD (s/p BMS to RCA 2004, s/p DES to RCA 2016), HFpEF, DM2, GERD, chronic pain, prior breast CA and  anemia presentswithchest pain and was found to have elevated troponin.  Assessment & Plan    1. NSTEMI: she has had multiple episodes of chest pain throughout the afternoon yesterday and again this morning. Pressure like with shortness of breath and tightness into her neck. Given recurrence of symptoms will plan for cardiac cath today. Had breakfast this morning, will make NPO.  -- The patient understands that risks included but are not limited to stroke (1 in 1000), death (1 in 1000), kidney failure [usually temporary] (1 in 500), bleeding (1 in 200), allergic reaction [possibly serious] (1 in 200).  -- EKG today without ischemia, will check troponin. If significantly elevated, consider restart of IV heparin -- recheck BMET this morning. Pre cath fluids ordered -- on ASA, statin, Imdur and BB  2. CAD s/p BMS to RCA 2004, s/p DES to RCA 2016: Prior to events on admission, she says she does not recall any anginal symptoms.  -- plan for cath as above  3. Anemia: baseline appears around 9-10. Has noted a decline since admission to 7.7. Had been on heparin.  -- improved to 9.0 with 1 unit PRBCs -- denies any dark tarry stools prior to admission, reports normal colonoscopy in the past   4. DM: Hgb A1c 8.9 -- SSI while inpatient  5. Dementia: on namenda. She is able to recall some events surrounding her chest pain but  specifics are unclear. Now with freq episodes of pain reported to nursing staff.   6. PE/DVT: has been on Xarelto 10mg  daily. Question whether this can be stopped? Primary to discuss with hematology.   For questions or updates, please contact [place] Please consult [email or web address] for contact info under        Signed, Reino Bellis, NP  02/17/2020, 10:15 AM    I have personally seen and examined this patient. I agree with the assessment and plan as outlined above.  She has elevated troponin in setting of chest pain c/w a NSTEMI. She continues to have episodes  of chest pain. She has a history of CAD with prior coronary stenting. Will start IV heparin. Hgb stable post transfusion with no evidence of active bleeding.  Plan cardiac cath today with possible PCI  [name] 02/17/2020 11:00 AM

## 2020-02-17 NOTE — Interval H&P Note (Signed)
History and Physical Interval Note:  02/17/2020 3:52 PM  [name]  has presented today for surgery, with the diagnosis of nstemi.  The various methods of treatment have been discussed with the patient and family. After consideration of risks, benefits and other options for treatment, the patient has consented to  Procedure(s): LEFT HEART CATH AND CORONARY ANGIOGRAPHY (N/A) PERCUTANEOUS CORONARY INTERVENTION    as a surgical intervention.  [name] history has been reviewed, patient examined, no change in status, stable for surgery.  I have reviewed the patient's chart and labs.  Questions were answered to the patient's satisfaction.    Cath Lab Visit (complete for each Cath Lab visit)  Clinical Evaluation Leading to the Procedure:   ACS: Yes.    Non-ACS:    Anginal Classification: CCS III  Anti-ischemic medical therapy: Minimal Therapy (1 class of medications)  Non-Invasive Test Results: No non-invasive testing performed  Prior CABG: No previous CABG   Glenetta Hew

## 2020-02-17 NOTE — Progress Notes (Addendum)
PT Cancellation Note  Patient Details Name: [name] MRN: [id number] DOB: 1934-07-05   Cancelled Treatment:    Reason Eval/Treat Not Completed: Medical issues which prohibited therapy. Pt with chest pain. Pt likely for cardiac cath later today. Will check back tomorrow.   [facility] 02/17/2020, 9:47 AM [place] Pager [phone] Office [phone]

## 2020-02-17 NOTE — Progress Notes (Signed)
PROGRESS NOTE    [name]  GYF:[id number] DOB: 17-Mar-1935 DOA: 02/14/2020 PCP: [name], NP    Brief Narrative: 85 year old female with history of CAD, history of PE and DVT on Xarelto at home type 2 diabetes, diastolic heart failure, GERD, anemia, breast cancer and morbid obesity admitted with 10 out of 10 chest pain multiple times radiating to her left upper extremity and jaw.  This is associated with dyspnea on exertion and worsening shortness of breath.  She has chronic pain and shoulder pain, but she felt that this pain is different from her regular shoulder pains. At baseline she moves around inside the house with a walker and has a wheelchair. She lives at home with her daughter.  Assessment & Plan:   Principal Problem:   Chest pain on exertion Active Problems:   Hyperlipidemia   Essential hypertension   History of deep venous thrombosis   Long term current use of anticoagulant therapy   Debility   OSA (obstructive sleep apnea)   COPD (chronic obstructive pulmonary disease) (HCC)   NSTEMI (non-ST elevated myocardial infarction) ([facility])  #1 NSTEMI-patient started to complain of left-sided chest pain again.  Chest pain sounds typical and atypical.  She gives a pleuritic component to the chest pain it increases with deep breathing and she is tender to touch over the left chest area.  Cardiology has seen her and restarted heparin and plan for cath today.  I have also ordered Voltaren gel to the left chest.  On aspirin atorvastatin and Toprol and Imdur.  Echocardiogram with normal ejection fraction no regional wall motion abnormalities.  cxr 10/12 nad  #2 type 2 diabetes complicated with neuropathy on Cymbalta A1c of 8.9 in August 2021 On long-acting insulin at home continue CBG (last 3)  Recent Labs    02/16/20 2115 02/17/20 0614 02/17/20 1119  GLUCAP 163* 128* 201*   Start SSI On Lantus 38 units daily  #3 history of DVT and PE in 2013-Xarelto currently on  hold since she is on heparin drip.  Discussed with oncologist.  With her anemia and blood transfusion we could stop Xarelto on discharge and just monitor.  She must follow-up with oncology upon discharge.  Dr. Pollie Meyer impression was her PE and DVT was not provoked.  #4 history of gastric bypass surgery  #5 morbid obesity  #6 history of essential hypertension-blood pressure 137/65.  Continue hydralazine Imdur and metoprolol.   #7 hyperlipidemia LDL of 60 on Pravachol 20 mg daily.  #8 leukocytosis White count 14.9 from 15.7 from 12.2-patient complained of cough and thick yellow phlegm.  No fever.  Covid negative influenza AMB negative. Chest x-ray no active disease. Could be stress-induced.  Will monitor closely will hold off on any antibiotics at this time. She has no urinary complaints.  #9 history of gout continue febuxostat  #10 history of dementia on  Namenda  #11 chronic anemia hemoglobin 7.7 down from 8.7 yesterday.  She has no evidence of active bleeding.  Check FOBT.  DC heparin drip.  Will check with oncologist to see if she needs to be on Xarelto.  Will transfuse 1 unit of packed RBC.  #12 AKI on CKD stage IIIb stable  #13 history of breast cancer on Arimidex  Estimated body mass index is 55.29 kg/m as calculated from the following:   Height as of this encounter: 5\' 7"  (1.702 m).   Weight as of this encounter: 160.1 kg.  DVT prophylaxis: Heparin  code Status: DNR Family  Communication: Discussed with daughter Disposition Plan:  Status is: Inpatient  Dispo: The patient is from: Home              Anticipated d/c is to: Home              Anticipated d/c date is: 2 days              Patient currently is not medically stable to d/c.   Consultants: Cardiology  Procedures: None Antimicrobials: None  Subjective: She is resting in bed eating breakfast.  She still complains of pleuritic left-sided chest pain.  Tenderness to touch along the left ribs.  She complains of  shortness of breath with normal saturation and associated left-sided chest pain.  Seen by cardiology taking her cath today..  Objective: Vitals:   02/16/20 2036 02/16/20 2300 02/17/20 0326 02/17/20 0859  BP: 140/66 (!) 145/66 140/72 (!) 121/49  Pulse: 75 77 78 81  Resp: 18 20 17    Temp: 98 F (36.7 C) 97.9 F (36.6 C) 98.3 F (36.8 C) (!) 97.5 F (36.4 C)  TempSrc: Oral Oral Oral Oral  SpO2: 93% 92% 99% 95%  Weight:      Height:        Intake/Output Summary (Last 24 hours) at 02/17/2020 1437 Last data filed at 02/17/2020 1230 Gross per 24 hour  Intake 368.58 ml  Output 1950 ml  Net -1581.42 ml   Filed Weights   02/14/20 1543  Weight: (!) 160.1 kg    Examination:  General exam: Appears calm and comfortable  Respiratory system: Clear to auscultation. Respiratory effort normal. Cardiovascular system: S1 & S2 heard, RRR. No JVD, murmurs, rubs, gallops or clicks. No pedal edema. Gastrointestinal system: Abdomen is nondistended, soft and nontender. No organomegaly or masses felt. Normal bowel sounds heard. Central nervous system: Alert and oriented. No focal neurological deficits. Extremities: 1+ bilateral pitting edema Skin: No rashes, lesions or ulcers Psychiatry: Judgement and insight appear normal. Mood & affect appropriate.     Data Reviewed: I have personally reviewed following labs and imaging studies  CBC: Recent Labs  Lab 02/14/20 1624 02/15/20 0636 02/16/20 0128 02/16/20 2116 02/17/20 0125  WBC 12.2* 15.7* 14.9*  --  12.1*  HGB 8.8* 8.7* 7.7* 9.3* 9.0*  HCT 29.8* 29.5* 26.1* 30.2* 29.6*  MCV 92.3 92.8 90.3  --  89.2  PLT 294 277 277  --  563   Basic Metabolic Panel: Recent Labs  Lab 02/14/20 1624 02/17/20 1151  NA 141 137  K 4.5 4.7  CL 106 105  CO2 25 23  GLUCOSE 166* 212*  BUN 31* 33*  CREATININE 1.48* 1.59*  CALCIUM 9.0 8.8*   GFR: Estimated Creatinine Clearance: 42.7 mL/min (A) (by C-G formula based on SCr of 1.59 mg/dL (H)). Liver  Function Tests: No results for input(s): AST, ALT, ALKPHOS, BILITOT, PROT, ALBUMIN in the last 168 hours. No results for input(s): LIPASE, AMYLASE in the last 168 hours. No results for input(s): AMMONIA in the last 168 hours. Coagulation Profile: No results for input(s): INR, PROTIME in the last 168 hours. Cardiac Enzymes: No results for input(s): CKTOTAL, CKMB, CKMBINDEX, TROPONINI in the last 168 hours. BNP (last 3 results) No results for input(s): PROBNP in the last 8760 hours. HbA1C: No results for input(s): HGBA1C in the last 72 hours. CBG: Recent Labs  Lab 02/16/20 1053 02/16/20 1547 02/16/20 2115 02/17/20 0614 02/17/20 1119  GLUCAP 281* 254* 163* 128* 201*   Lipid Profile: Recent Labs  02/15/20 0636  CHOL 117  HDL 40*  LDLCALC 60  TRIG 85  CHOLHDL 2.9   Thyroid Function Tests: No results for input(s): TSH, T4TOTAL, FREET4, T3FREE, THYROIDAB in the last 72 hours. Anemia Panel: No results for input(s): VITAMINB12, FOLATE, FERRITIN, TIBC, IRON, RETICCTPCT in the last 72 hours. Sepsis Labs: No results for input(s): PROCALCITON, LATICACIDVEN in the last 168 hours.  Recent Results (from the past 240 hour(s))  Respiratory Panel by RT PCR (Flu A&B, Covid) - Nasopharyngeal Swab     Status: None   Collection Time: 02/14/20 10:45 PM   Specimen: Nasopharyngeal Swab  Result Value Ref Range Status   SARS Coronavirus 2 by RT PCR NEGATIVE NEGATIVE Final    Comment: (NOTE) SARS-CoV-2 target nucleic acids are NOT DETECTED.  The SARS-CoV-2 RNA is generally detectable in upper respiratoy specimens during the acute phase of infection. The lowest concentration of SARS-CoV-2 viral copies this assay can detect is 131 copies/mL. A negative result does not preclude SARS-Cov-2 infection and should not be used as the sole basis for treatment or other patient management decisions. A negative result may occur with  improper specimen collection/handling, submission of specimen  other than nasopharyngeal swab, presence of viral mutation(s) within the areas targeted by this assay, and inadequate number of viral copies (<131 copies/mL). A negative result must be combined with clinical observations, patient history, and epidemiological information. The expected result is Negative.  Fact Sheet for Patients:  PinkCheek.be  Fact Sheet for Healthcare Providers:  GravelBags.it  This test is no t yet approved or cleared by the [place] FDA and  has been authorized for detection and/or diagnosis of SARS-CoV-2 by FDA under an Emergency Use Authorization (EUA). This EUA will remain  in effect (meaning this test can be used) for the duration of the COVID-19 declaration under Section 564(b)(1) of the Act, 21 U.S.C. section 360bbb-3(b)(1), unless the authorization is terminated or revoked sooner.     Influenza A by PCR NEGATIVE NEGATIVE Final   Influenza B by PCR NEGATIVE NEGATIVE Final    Comment: (NOTE) The Xpert Xpress SARS-CoV-2/FLU/RSV assay is intended as an aid in  the diagnosis of influenza from Nasopharyngeal swab specimens and  should not be used as a sole basis for treatment. Nasal washings and  aspirates are unacceptable for Xpert Xpress SARS-CoV-2/FLU/RSV  testing.  Fact Sheet for Patients: PinkCheek.be  Fact Sheet for Healthcare Providers: GravelBags.it  This test is not yet approved or cleared by the [place] FDA and  has been authorized for detection and/or diagnosis of SARS-CoV-2 by  FDA under an Emergency Use Authorization (EUA). This EUA will remain  in effect (meaning this test can be used) for the duration of the  Covid-19 declaration under Section 564(b)(1) of the Act, 21  U.S.C. section 360bbb-3(b)(1), unless the authorization is  terminated or revoked. Performed at [facility], [place] [address],  [place], [place] [address]          Radiology Studies: DG Chest 1 View  Result Date: 02/17/2020 CLINICAL DATA:  Chest pain EXAM: CHEST  1 VIEW COMPARISON:  02/14/2020 FINDINGS: Trachea midline. Cardiomediastinal contours with mild cardiac enlargement. Mild elevation of the RIGHT hemidiaphragm and low lung volumes without lobar consolidation or sign of pleural effusion on frontal view. On limited assessment no acute skeletal process. IMPRESSION: Mild cardiac enlargement. No acute cardiopulmonary disease. Electronically Signed   By: Zetta Bills M.D.   On: 02/17/2020 10:58  Scheduled Meds: . sodium chloride   Intravenous Once  . anastrozole  1 mg Oral Daily  . aspirin EC  81 mg Oral Daily  . diclofenac Sodium  2 g Topical TID  . DULoxetine  60 mg Oral Daily  . febuxostat  40 mg Oral Daily  . heparin injection (subcutaneous)  5,000 Units Subcutaneous Q8H  . hydrALAZINE  50 mg Oral BID  . insulin aspart  0-20 Units Subcutaneous TID WC  . insulin aspart  0-5 Units Subcutaneous QHS  . insulin glargine  38 Units Subcutaneous Daily  . isosorbide mononitrate  60 mg Oral Daily  . memantine  10 mg Oral Daily   And  . memantine  5 mg Oral QHS  . metoprolol succinate  50 mg Oral Daily  . mometasone-formoterol  2 puff Inhalation BID  . pantoprazole  40 mg Oral Daily  . pravastatin  20 mg Oral QHS  . sodium chloride flush  3 mL Intravenous Q12H  . topiramate  25 mg Oral Daily   Continuous Infusions: . sodium chloride    . sodium chloride       LOS: 2 days     Georgette Shell, MD  02/17/2020, 2:37 PM

## 2020-02-17 NOTE — Progress Notes (Addendum)
Progress Note  Patient Name: [name] Date of Encounter: 02/17/2020  [facility] Cardiologist: Ena Dawley, MD   Subjective   Has been having intermittent episodes of chest pain during the evening and this morning, feels heavy with radiation into her neck and associated shortness of breath.   Inpatient Medications    Scheduled Meds: . sodium chloride   Intravenous Once  . anastrozole  1 mg Oral Daily  . aspirin EC  81 mg Oral Daily  . diclofenac Sodium  2 g Topical TID  . DULoxetine  60 mg Oral Daily  . febuxostat  40 mg Oral Daily  . hydrALAZINE  50 mg Oral BID  . insulin aspart  0-20 Units Subcutaneous TID WC  . insulin aspart  0-5 Units Subcutaneous QHS  . insulin glargine  38 Units Subcutaneous Daily  . isosorbide mononitrate  60 mg Oral Daily  . memantine  10 mg Oral Daily   And  . memantine  5 mg Oral QHS  . metoprolol succinate  50 mg Oral Daily  . mometasone-formoterol  2 puff Inhalation BID  . pantoprazole  40 mg Oral Daily  . pravastatin  20 mg Oral QHS  . topiramate  25 mg Oral Daily   Continuous Infusions:  PRN Meds: acetaminophen, albuterol, fluticasone, furosemide, guaiFENesin, ondansetron (ZOFRAN) IV, oxyCODONE   Vital Signs    Vitals:   02/16/20 2036 02/16/20 2300 02/17/20 0326 02/17/20 0859  BP: 140/66 (!) 145/66 140/72 (!) 121/49  Pulse: 75 77 78 81  Resp: 18 20 17    Temp: 98 F (36.7 C) 97.9 F (36.6 C) 98.3 F (36.8 C) (!) 97.5 F (36.4 C)  TempSrc: Oral Oral Oral Oral  SpO2: 93% 92% 99% 95%  Weight:      Height:        Intake/Output Summary (Last 24 hours) at 02/17/2020 1015 Last data filed at 02/17/2020 0326 Gross per 24 hour  Intake 368.58 ml  Output 1150 ml  Net -781.42 ml   Last 3 Weights 02/14/2020 12/23/2019 09/12/2019  Weight (lbs) 353 lb 329 lb 320 lb  Weight (kg) 160.12 kg 149.233 kg 145.151 kg      Telemetry    SR - Personally Reviewed  ECG    SR with no acute ST/T wave changes - Personally  Reviewed  Physical Exam  Pleasant older AAF GEN: No acute distress.   Neck: No JVD Cardiac: RRR, no murmurs, rubs, or gallops.  Respiratory: Clear to auscultation bilaterally. GI: Soft, nontender, non-distended  MS: No edema; No deformity. Neuro:  Nonfocal  Psych: Normal affect   Labs    High Sensitivity Troponin:   Recent Labs  Lab 02/14/20 1624 02/14/20 2037 02/15/20 1144  TROPONINIHS 44* 81* 242*      Chemistry Recent Labs  Lab 02/14/20 1624  NA 141  K 4.5  CL 106  CO2 25  GLUCOSE 166*  BUN 31*  CREATININE 1.48*  CALCIUM 9.0  GFRNONAA 32*  ANIONGAP 10     Hematology Recent Labs  Lab 02/15/20 0636 02/15/20 0636 02/16/20 0128 02/16/20 2116 02/17/20 0125  WBC 15.7*  --  14.9*  --  12.1*  RBC 3.18*  --  2.89*  --  3.32*  HGB 8.7*   < > 7.7* 9.3* 9.0*  HCT 29.5*   < > 26.1* 30.2* 29.6*  MCV 92.8  --  90.3  --  89.2  MCH 27.4  --  26.6  --  27.1  MCHC 29.5*  --  29.5*  --  30.4  RDW 14.5  --  14.3  --  14.5  PLT 277  --  277  --  268   < > = values in this interval not displayed.    BNPNo results for input(s): BNP, PROBNP in the last 168 hours.   DDimer No results for input(s): DDIMER in the last 168 hours.   Radiology    ECHOCARDIOGRAM COMPLETE  Result Date: 02/15/2020    ECHOCARDIOGRAM REPORT   Patient Name:   [name] Date of Exam: 02/15/2020 Medical Rec #:  [id number]       Height:       67.0 in Accession #:    [id number]      Weight:       353.0 lb Date of Birth:  10/11/1934       BSA:          2.575 m Patient Age:    36 years        BP:           89/50 mmHg Patient Gender: F               HR:           77 bpm. Exam Location:  Inpatient Procedure: 2D Echo and Intracardiac Opacification Agent Indications:    Abnormal ECG 794.31 / R94.31  History:        Patient has prior history of Echocardiogram examinations, most                 recent 10/09/2017. CHF, CAD and Previous Myocardial Infarction;                 Risk Factors:Diabetes,  Hypertension and Dyslipidemia. Chronic                 kidney disease, GERD, breast cancer.  Sonographer:    Darlina Sicilian RDCS Referring Phys: [id number] [place] Comments: No parasternal window and no subcostal window. Image acquisition challenging due to patient body habitus. IMPRESSIONS  1. Technically difficult echo with poor image quality.  2. Left ventricular ejection fraction, by estimation, is 65 to 70%. The left ventricle has normal function. The left ventricle has no regional wall motion abnormalities. Left ventricular diastolic parameters are consistent with Grade I diastolic dysfunction (impaired relaxation).  3. Right ventricular systolic function was not well visualized. The right ventricular size is not well visualized.  4. The mitral valve was not well visualized. No evidence of mitral valve regurgitation.  5. The aortic valve was not well visualized. Aortic valve regurgitation is not visualized. FINDINGS  Left Ventricle: Left ventricular ejection fraction, by estimation, is 65 to 70%. The left ventricle has normal function. The left ventricle has no regional wall motion abnormalities. Definity contrast agent was given IV to delineate the left ventricular  endocardial borders. The left ventricular internal cavity size was normal in size. There is no left ventricular hypertrophy. Left ventricular diastolic parameters are consistent with Grade I diastolic dysfunction (impaired relaxation). Right Ventricle: The right ventricular size is not well visualized. Right vetricular wall thickness was not well visualized. Right ventricular systolic function was not well visualized. Left Atrium: Left atrial size was normal in size. Right Atrium: Right atrial size was normal in size. Pericardium: There is no evidence of pericardial effusion. Mitral Valve: The mitral valve was not well visualized. No evidence of mitral valve regurgitation. Tricuspid Valve: The tricuspid valve is not well  visualized. Tricuspid  valve regurgitation is not demonstrated. Aortic Valve: The aortic valve was not well visualized. Aortic valve regurgitation is not visualized. Pulmonic Valve: The pulmonic valve was not well visualized. Pulmonic valve regurgitation is not visualized. Aorta: The aortic root was not well visualized. IAS/Shunts: The atrial septum is grossly normal. Additional Comments: Technically difficult echo with poor image quality.   Diastology LV e' medial:    3.70 cm/s LV E/e' medial:  19.7 LV e' lateral:   6.53 cm/s LV E/e' lateral: 11.1  LEFT ATRIUM             Index LA Vol (A2C):   24.4 ml 9.48 ml/m LA Vol (A4C):   35.7 ml 13.87 ml/m LA Biplane Vol: 30.3 ml 11.77 ml/m  AORTIC VALVE LVOT Vmax:   93.70 cm/s LVOT Vmean:  73.200 cm/s LVOT VTI:    0.158 m MITRAL VALVE MV Area (PHT): 3.60 cm     SHUNTS MV Decel Time: 211 msec     Systemic VTI: 0.16 m MV E velocity: 72.80 cm/s MV A velocity: 119.00 cm/s MV E/A ratio:  0.61 Mertie Moores MD Electronically signed by Mertie Moores MD Signature Date/Time: 02/15/2020/1:23:56 PM    Final     Cardiac Studies   Echo: 02/15/20  IMPRESSIONS    1. Technically difficult echo with poor image quality.  2. Left ventricular ejection fraction, by estimation, is 65 to 70%. The  left ventricle has normal function. The left ventricle has no regional  wall motion abnormalities. Left ventricular diastolic parameters are  consistent with Grade I diastolic  dysfunction (impaired relaxation).  3. Right ventricular systolic function was not well visualized. The right  ventricular size is not well visualized.  4. The mitral valve was not well visualized. No evidence of mitral valve  regurgitation.  5. The aortic valve was not well visualized. Aortic valve regurgitation  is not visualized.   Patient Profile     85 y.o. female with PMH of morbid obesity, CAD (s/p BMS to RCA 2004, s/p DES to RCA 2016), HFpEF, DM2, GERD, chronic pain, prior breast CA and  anemia presentswithchest pain and was found to have elevated troponin.  Assessment & Plan    1. NSTEMI: she has had multiple episodes of chest pain throughout the afternoon yesterday and again this morning. Pressure like with shortness of breath and tightness into her neck. Given recurrence of symptoms will plan for cardiac cath today. Had breakfast this morning, will make NPO.  -- The patient understands that risks included but are not limited to stroke (1 in 1000), death (1 in 1000), kidney failure [usually temporary] (1 in 500), bleeding (1 in 200), allergic reaction [possibly serious] (1 in 200).  -- EKG today without ischemia, will check troponin. If significantly elevated, consider restart of IV heparin -- recheck BMET this morning. Pre cath fluids ordered -- on ASA, statin, Imdur and BB  2. CAD s/p BMS to RCA 2004, s/p DES to RCA 2016: Prior to events on admission, she says she does not recall any anginal symptoms.  -- plan for cath as above  3. Anemia: baseline appears around 9-10. Has noted a decline since admission to 7.7. Had been on heparin.  -- improved to 9.0 with 1 unit PRBCs -- denies any dark tarry stools prior to admission, reports normal colonoscopy in the past   4. DM: Hgb A1c 8.9 -- SSI while inpatient  5. Dementia: on namenda. She is able to recall some events surrounding her chest pain but  specifics are unclear. Now with freq episodes of pain reported to nursing staff.   6. PE/DVT: has been on Xarelto 10mg  daily. Question whether this can be stopped? Primary to discuss with hematology.   For questions or updates, please contact [place] Please consult [email or web address] for contact info under        Signed, Reino Bellis, NP  02/17/2020, 10:15 AM    I have personally seen and examined this patient. I agree with the assessment and plan as outlined above.  She has elevated troponin in setting of chest pain c/w a NSTEMI. She continues to have episodes  of chest pain. She has a history of CAD with prior coronary stenting. Will start IV heparin. Hgb stable post transfusion with no evidence of active bleeding.  Plan cardiac cath today with possible PCI  [name] 02/17/2020 11:00 AM

## 2020-02-18 ENCOUNTER — Encounter (HOSPITAL_COMMUNITY): Payer: Self-pay | Admitting: Cardiology

## 2020-02-18 DIAGNOSIS — Z86718 Personal history of other venous thrombosis and embolism: Secondary | ICD-10-CM

## 2020-02-18 DIAGNOSIS — Z955 Presence of coronary angioplasty implant and graft: Secondary | ICD-10-CM

## 2020-02-18 DIAGNOSIS — R5381 Other malaise: Secondary | ICD-10-CM

## 2020-02-18 DIAGNOSIS — I214 Non-ST elevation (NSTEMI) myocardial infarction: Principal | ICD-10-CM

## 2020-02-18 LAB — CBC
HCT: 29 % — ABNORMAL LOW (ref 36.0–46.0)
Hemoglobin: 8.6 g/dL — ABNORMAL LOW (ref 12.0–15.0)
MCH: 26.9 pg (ref 26.0–34.0)
MCHC: 29.7 g/dL — ABNORMAL LOW (ref 30.0–36.0)
MCV: 90.6 fL (ref 80.0–100.0)
Platelets: 257 10*3/uL (ref 150–400)
RBC: 3.2 MIL/uL — ABNORMAL LOW (ref 3.87–5.11)
RDW: 14.2 % (ref 11.5–15.5)
WBC: 12.5 10*3/uL — ABNORMAL HIGH (ref 4.0–10.5)
nRBC: 0 % (ref 0.0–0.2)

## 2020-02-18 LAB — GLUCOSE, CAPILLARY
Glucose-Capillary: 198 mg/dL — ABNORMAL HIGH (ref 70–99)
Glucose-Capillary: 254 mg/dL — ABNORMAL HIGH (ref 70–99)
Glucose-Capillary: 278 mg/dL — ABNORMAL HIGH (ref 70–99)
Glucose-Capillary: 329 mg/dL — ABNORMAL HIGH (ref 70–99)

## 2020-02-18 LAB — BASIC METABOLIC PANEL
Anion gap: 8 (ref 5–15)
BUN: 29 mg/dL — ABNORMAL HIGH (ref 8–23)
CO2: 22 mmol/L (ref 22–32)
Calcium: 8.5 mg/dL — ABNORMAL LOW (ref 8.9–10.3)
Chloride: 107 mmol/L (ref 98–111)
Creatinine, Ser: 1.44 mg/dL — ABNORMAL HIGH (ref 0.44–1.00)
GFR, Estimated: 33 mL/min — ABNORMAL LOW (ref >60)
Glucose, Bld: 161 mg/dL — ABNORMAL HIGH (ref 70–99)
Potassium: 4.2 mmol/L (ref 3.5–5.1)
Sodium: 137 mmol/L (ref 135–145)

## 2020-02-18 MED ORDER — MAGNESIUM HYDROXIDE 400 MG/5ML PO SUSP: 30.0000 mL | Freq: Two times a day (BID) | ORAL | Status: DC | PRN

## 2020-02-18 MED ORDER — ATORVASTATIN CALCIUM 80 MG PO TABS
80.0000 mg | ORAL_TABLET | Freq: Every day | ORAL | 1 refills | Status: DC
Start: 2020-02-18 — End: 2020-03-29

## 2020-02-18 MED ORDER — ATORVASTATIN CALCIUM 80 MG PO TABS
80.0000 mg | ORAL_TABLET | Freq: Every day | ORAL | Status: DC
  Administered 2020-02-18: 80 mg via ORAL
  Filled 2020-02-18: qty 1

## 2020-02-18 MED ORDER — INFLUENZA VAC A&B SA ADJ QUAD 0.5 ML IM PRSY
0.5000 mL | PREFILLED_SYRINGE | INTRAMUSCULAR | Status: AC
  Administered 2020-02-18: 0.5 mL via INTRAMUSCULAR
  Filled 2020-02-18: qty 0.5

## 2020-02-18 MED ORDER — CLOPIDOGREL BISULFATE 75 MG PO TABS
75.0000 mg | ORAL_TABLET | Freq: Every day | ORAL | 1 refills | Status: DC
Start: 2020-02-18 — End: 2020-03-29

## 2020-02-18 MED ORDER — INFLUENZA VAC A&B SA ADJ QUAD 0.5 ML IM PRSY: 0.5000 mL | PREFILLED_SYRINGE | INTRAMUSCULAR | Status: DC

## 2020-02-18 MED ORDER — DIPHENHYDRAMINE HCL 25 MG PO CAPS
25.0000 mg | ORAL_CAPSULE | Freq: Once | ORAL | Status: AC
  Administered 2020-02-18: 25 mg via ORAL
  Filled 2020-02-18: qty 1

## 2020-02-18 MED ORDER — ASPIRIN 81 MG PO TBEC
81.0000 mg | DELAYED_RELEASE_TABLET | Freq: Every day | ORAL | 1 refills | Status: DC
Start: 2020-02-18 — End: 2020-03-29

## 2020-02-18 NOTE — Discharge Summary (Addendum)
Physician Discharge Summary  VERMA GROTHAUS HDQ:[id number] DOB: 05/29/1934 DOA: 02/14/2020  PCP: [name], NP  Admit date: 02/14/2020 Discharge date: 02/18/2020  Time spent: 45 minutes  Recommendations for Outpatient Follow-up:  Patient will be discharged to home.  Patient will need to follow up with primary care provider within one week of discharge.  Follow up with cardiology and oncology. Patient should continue medications as prescribed.  Patient should follow a heart healthy diet.   Discharge Diagnoses:  Chest pain/NSTEMI Diabetes mellitus, type II with neuropathy History of DVT and PE History of gastric bypass Morbid obesity Essential hypertension Hyperlipidemia Leukocytosis Gout Dementia Chronic anemia Chronic kidney disease, stage IIIb Breast cancer  Discharge Condition: Stable  Diet recommendation: heart healthy  Filed Weights   02/14/20 1543  Weight: (!) 160.1 kg    History of present illness:  On 02/14/2020 by Dr. Marisa Sprinkles [name] is a 85 y.o. female with medical history significant for hypertension   [name] experienced chest pain with movement/exertion, 10/10, with radiation to left upper extremity, jaw, lasting minutes, and associated shortness of breath and headache. She reports she has been having this pain for the last week and today it was the worse, prompting her to call her daughter for help. She also endorsed dry productive thick grey and/or yellow mucous in the last 2-3 months.  She reports since being in the hospital, her shortness of breath has improved and she does not know why. She reports not taking her nitroglycerin at home because she didn't know she was having a heart issue. Daughter reports she took her pain medications at home and some improvement of chest pain.   ROS was negative for changes to baseline nausea (which she associates with PO intake), abdominal pain, urinary/bowel complaints, vision changes. She denies  fever, chills.   Hospital Course:  Chest pain/NSTEMI -Patient presented with left-sided chest pain which had both typical and atypical features.  She did have some pleuritic component of chest pain increased with deep breathing and was tender to touch. -She was started on heparin drip as well as Voltaren gel -Chest x-ray was unremarkable -Cardiology consulted and appreciated, status post cardiac catheterization: Multivessel CAD with severe 95% IntraStent stenosis in the proximal RCA and no flow stenosis distal to mid RCA stent 85%, both treated with DES PCI -Echocardiogram EF 65 to 11%, grade 1 diastolic dysfunction -Plan for patient to be discharged with aspirin and Plavix, statin, metoprolol, Imdur -Patient to follow-up as an outpatient with cardiology  Diabetes mellitus, type II with neuropathy -Hemoglobin A1c 8.9 in August 2021 -Continue Lantus and novolog  History of DVT and PE -Xarelto was held -Given her age, and current anemia, would favor holding Xarelto. -Discussed with Dr. Irene Limbo, oncologist, who states that patient was on Xarelto when she started seeing him.  Patient has had issues with anemia in the past.  She is currently on a maintenance dose of Xarelto.  Agreed to holding Xarelto for now and to continue on with aspirin and Plavix.  History of gastric bypass -Stable  Morbid obesity -BMI 55.29 -Patient to follow-up with PCP to discuss lifestyle modifications  Essential hypertension -Continue hydralazine, metoprolol  Hyperlipidemia -Continue statin  Leukocytosis -Chest x-ray unremarkable for infection -Covid and influenza unremarkable  -Patient denied any urinary complaints -Likely secondary to the above -WBC trending downward  Gout -Continue for febuxostat  Dementia -Continue Namenda  Chronic anemia -Hemoglobin appears to be between 8 and 9 at baseline -Hemoglobin had dropped  to 7.7, and she was transfused 1 unit of PRBC -Currently 8.6 -Repeat CBC in 1  week  Chronic kidney disease, stage IIIb -Appears to be stable  Breast cancer -On Arimidex -Follow-up with Dr. Irene Limbo, oncology  Procedures: Echocardiogram Catheterization  Consultations: Cardiology  Code status: DNR  Discharge Exam: Vitals:   02/18/20 0810 02/18/20 0855  BP:  (!) 132/47  Pulse:  81  Resp:  17  Temp:  98.1 F (36.7 C)  SpO2: 98% 97%     General: Well developed, well nourished, NAD, appears stated age  HEENT: NCAT, mucous membranes moist.  Cardiovascular: S1 S2 auscultated, RRR  Respiratory: Clear to auscultation bilaterally with equal chest rise  Abdomen: Soft, obese, nontender, nondistended, + bowel sounds  Extremities: warm dry without cyanosis clubbing or edema  Neuro: AAOx3, nonfocal  Psych: appropriate mood and affect, pleasant  Discharge Instructions Discharge Instructions    Amb Referral to Cardiac Rehabilitation   Complete by: As directed    Diagnosis:  Coronary Stents NSTEMI     After initial evaluation and assessments completed: Virtual Based Care may be provided alone or in conjunction with Phase 2 Cardiac Rehab based on patient barriers.: Yes   Discharge instructions   Complete by: As directed    Patient will be discharged to home.  Patient will need to follow up with primary care provider within one week of discharge.  Follow up with cardiology and oncology. Patient should continue medications as prescribed.  Patient should follow a heart healthy diet.     Allergies as of 02/18/2020      Reactions   Sulfonamide Derivatives Swelling   Mouth swelling   Aricept [donepezil Hcl] Diarrhea, Nausea And Vomiting   GI upset/loose stools   Tramadol Nausea And Vomiting      Medication List    STOP taking these medications   pravastatin 20 MG tablet Commonly known as: PRAVACHOL   Xarelto 10 MG Tabs tablet Generic drug: rivaroxaban     TAKE these medications   acetaminophen 500 MG tablet Commonly known as: TYLENOL Take  1,000 mg by mouth daily.   albuterol 108 (90 Base) MCG/ACT inhaler Commonly known as: ProAir HFA Inhale 1-2 puffs into the lungs every 6 (six) hours as needed for wheezing or shortness of breath.   anastrozole 1 MG tablet Commonly known as: ARIMIDEX TAKE 1 TABLET BY MOUTH EVERY DAY   aspirin 81 MG EC tablet Take 1 tablet (81 mg total) by mouth daily. Swallow whole.   atorvastatin 80 MG tablet Commonly known as: LIPITOR Take 1 tablet (80 mg total) by mouth daily.   budesonide-formoterol 160-4.5 MCG/ACT inhaler Commonly known as: SYMBICORT Inhale 2 puffs into the lungs 2 (two) times daily. For bronchitis What changed:   when to take this  reasons to take this   cetirizine 10 MG tablet Commonly known as: ZYRTEC Take 1 tablet (10 mg total) by mouth daily.   clopidogrel 75 MG tablet Commonly known as: PLAVIX Take 1 tablet (75 mg total) by mouth daily with breakfast.   DULoxetine 60 MG capsule Commonly known as: CYMBALTA TAKE 1 CAPSULE BY MOUTH EVERY DAY FOR ANXIETY What changed:   how much to take  how to take this  when to take this  additional instructions   ergocalciferol 1.25 MG (50000 UT) capsule Commonly known as: VITAMIN D2 Take 1 capsule (50,000 Units total) by mouth once a week. What changed: when to take this   febuxostat 40 MG tablet Commonly known as:  ULORIC TAKE 1 TABLET BY MOUTH EVERY DAY   fluticasone 50 MCG/ACT nasal spray Commonly known as: FLONASE Place 1 spray into both nostrils in the morning and at bedtime. What changed:   when to take this  reasons to take this   FreeStyle Libre 14 Day Sensor Misc USE UNIT TO TEST FOUR TIMES DAILY, BEFORE MEALS AND AT BEDTIME.   furosemide 20 MG tablet Commonly known as: LASIX TAKE 1 TABLET BY MOUTH EVERY DAY AS NEEDED What changed: reasons to take this   hydrALAZINE 50 MG tablet Commonly known as: APRESOLINE Take 1 tablet (50 mg total) by mouth 2 (two) times daily.   hydrOXYzine 10 MG  tablet Commonly known as: ATARAX/VISTARIL Take 1 tablet by mouth three times daily as needed What changed: reasons to take this   isosorbide mononitrate 60 MG 24 hr tablet Commonly known as: IMDUR TAKE 1 TABLET BY MOUTH EVERY DAY What changed:   how much to take  how to take this  when to take this  additional instructions   loratadine 10 MG tablet Commonly known as: CLARITIN Take 10 mg by mouth daily as needed for allergies.   meclizine 25 MG tablet Commonly known as: ANTIVERT TAKE 1 TABLET BY MOUTH THREE TIMES DAILY AS NEEDED FOR DIZZINESS What changed:   how much to take  how to take this  when to take this  additional instructions   memantine 5 MG tablet Commonly known as: NAMENDA TAKE 2 TABLETS BY MOUTH EVERY MORNING and TAKE 1 TABLET EVERY EVENING FOR memory What changed:   how much to take  how to take this  when to take this  additional instructions   metoprolol succinate 50 MG 24 hr tablet Commonly known as: TOPROL-XL Take 50 mg by mouth daily.   nitroGLYCERIN 0.4 MG SL tablet Commonly known as: Nitrostat Take one tablet under the tongue every 5 minutes as needed for chest pain What changed:   how much to take  how to take this  when to take this  reasons to take this  additional instructions   NovoLOG FlexPen 100 UNIT/ML FlexPen Generic drug: insulin aspart Inject 16 Units into the skin 3 (three) times daily with meals. Max daily dose 70 units What changed:   how much to take  when to take this  additional instructions   pantoprazole 40 MG tablet Commonly known as: PROTONIX TAKE 1 TABLET BY MOUTH EVERY DAY   pregabalin 25 MG capsule Commonly known as: Lyrica Take 1 capsule (25 mg total) by mouth 2 (two) times daily.   sodium polystyrene 15 GM/60ML suspension Commonly known as: SPS TAKE 15 grams (60 mls) BY MOUTH ONCE WEEKLY TO keep potassium down What changed:   how much to take  how to take this  when to take  this  additional instructions   topiramate 25 MG tablet Commonly known as: TOPAMAX Take 1 tablet (25 mg total) by mouth daily. What changed: when to take this   Toujeo Max SoloStar 300 UNIT/ML Solostar Pen Generic drug: insulin glargine (2 Unit Dial) Inject 38 Units into the skin daily. Patient assistance program provides   VITAMIN B-12 PO Take 1 tablet by mouth every evening.      Allergies  Allergen Reactions  . Sulfonamide Derivatives Swelling    Mouth swelling  . Aricept [Donepezil Hcl] Diarrhea and Nausea And Vomiting    GI upset/loose stools  . Tramadol Nausea And Vomiting    Follow-up Information  Charlie Pitter, PA-C Follow up on 03/02/2020.   Specialties: Cardiology, Radiology Why: at 2:15pm for your follow up appt Contact information: [address] [place] [address] [phone]        [name], NP. Schedule an appointment as soon as possible for a visit in 1 week(s).   Specialty: Geriatric Medicine Why: Hospital follow up Contact information: [address] [place] [address] [phone]        [name], MD .   Specialty: Cardiology Contact information: [address] [place] [address] [phone]        [name], MD. Schedule an appointment as soon as possible for a visit in 2 week(s).   Specialties: Hematology, Oncology Why: Hospital follow up Contact information: [name] [place] [address] [phone]                The results of significant diagnostics from this hospitalization (including imaging, microbiology, ancillary and laboratory) are listed below for reference.    Significant Diagnostic Studies: DG Chest 1 View  Result Date: 02/17/2020 CLINICAL DATA:  Chest pain EXAM: CHEST  1 VIEW COMPARISON:  02/14/2020 FINDINGS: Trachea midline. Cardiomediastinal contours with mild cardiac enlargement. Mild elevation of the  RIGHT hemidiaphragm and low lung volumes without lobar consolidation or sign of pleural effusion on frontal view. On limited assessment no acute skeletal process. IMPRESSION: Mild cardiac enlargement. No acute cardiopulmonary disease. Electronically Signed   By: Zetta Bills M.D.   On: 02/17/2020 10:58   DG Chest 2 View  Result Date: 02/14/2020 CLINICAL DATA:  Chest pain EXAM: CHEST - 2 VIEW COMPARISON:  Sep 08, 2019 FINDINGS: The heart size and mediastinal contours are within normal limits. Both lungs are clear. There is chronic elevation of right hemidiaphragm. The visualized skeletal structures are unremarkable. IMPRESSION: No active cardiopulmonary disease. Electronically Signed   By: Abelardo Diesel M.D.   On: 02/14/2020 16:54   DG Cervical Spine Complete  Result Date: 02/03/2020 CLINICAL DATA:  Left-sided neck and shoulder pain for 1 year, no known injury, initial encounter EXAM: CERVICAL SPINE - COMPLETE 4+ VIEW COMPARISON:  None. FINDINGS: Seven cervical segments are visualized. No compression deformity is seen. Mild osteophytic changes are noted. Multilevel facet hypertrophic changes are seen. No prevertebral soft tissue abnormality is noted. Carotid calcifications are seen. The neural foramina are patent. No other focal abnormality is noted. IMPRESSION: Multilevel degenerative change without acute abnormality. Electronically Signed   By: Inez Catalina M.D.   On: 02/03/2020 08:08   CARDIAC CATHETERIZATION  Result Date: 18/29/9371  LV end diastolic pressure is mildly elevated.  ------------------------------  Mid LM to Prox LAD lesion is 30% stenosed with 30% stenosed side branch in Ost Cx.  Prox LAD to Mid LAD lesion is 45% stenosed with 25% stenosed side branch in 1st Diag.  1st Mrg lesion is 100% stenosed.  ----------------  Colon Flattery RCA to Prox RCA stent is 10% stenosed.  Prox RCA to Mid RCA stent is 5% stenosed.  Prox RCA lesion is 95% stenosed, between the 2 old stents  A drug-eluting  stent was successfully placed using a STENT RESOLUTE ONYX 4.0X18 -> postdilated to 4.6-4.7 mm  Post intervention, there is a 0% residual stenosis.  ----------------  Mid RCA lesion is 85% stenosed after the second stent  A drug-eluting stent was successfully placed using a STENT RESOLUTE ONYX 3.0X18 -> tapered post dilation from 4.2-3.3 mm  Post intervention,  there is a 0% residual stenosis.  SUMMARY  Multivessel CAD with severe (95%) IntraStent stenosis in the proximal RCA and de novo stenosis distal to mid RCA stent (85%) -> both treated with DES PCI using Resolute Onyx DES stents (distal-3.0 mm x 18 mm with tapered post dilation 4.2-3.3 mm; proximal 4.0 mm 18 mm postdilated to 4.7 mm.)  Diffuse mild to moderate calcified disease in the distal Left Main as well as ostial and proximal LCx and LAD with no severe stenoses.  Borderline elevated LVEDP with severe systemic hypertension RECOMMENDATIONS  Return to nursing unit for ongoing care.  Okay to DC heparin.  We will need to determine plus or minus the use of Xarelto plus Plavix.  For now would continue aspirin plus Plavix until discharge and then discontinue aspirin on discharge with Xarelto plus Plavix for 3-6 months.  Continue to monitor anemia and other risk factors. Discussed with Dr. Angelena Form. Glenetta Hew, MD  ECHOCARDIOGRAM COMPLETE  Result Date: 02/15/2020    ECHOCARDIOGRAM REPORT   Patient Name:   AXIE HAYNE Date of Exam: 02/15/2020 Medical Rec #:  [id number]       Height:       67.0 in Accession #:    [id number]      Weight:       353.0 lb Date of Birth:  [birth year]       BSA:          2.575 m Patient Age:    86 years        BP:           89/50 mmHg Patient Gender: F               HR:           77 bpm. Exam Location:  Inpatient Procedure: 2D Echo and Intracardiac Opacification Agent Indications:    Abnormal ECG 794.31 / R94.31  History:        Patient has prior history of Echocardiogram examinations, most                 recent  10/09/2017. CHF, CAD and Previous Myocardial Infarction;                 Risk Factors:Diabetes, Hypertension and Dyslipidemia. Chronic                 kidney disease, GERD, breast cancer.  Sonographer:    Darlina Sicilian RDCS Referring Phys: [id number] [place] Comments: No parasternal window and no subcostal window. Image acquisition challenging due to patient body habitus. IMPRESSIONS  1. Technically difficult echo with poor image quality.  2. Left ventricular ejection fraction, by estimation, is 65 to 70%. The left ventricle has normal function. The left ventricle has no regional wall motion abnormalities. Left ventricular diastolic parameters are consistent with Grade I diastolic dysfunction (impaired relaxation).  3. Right ventricular systolic function was not well visualized. The right ventricular size is not well visualized.  4. The mitral valve was not well visualized. No evidence of mitral valve regurgitation.  5. The aortic valve was not well visualized. Aortic valve regurgitation is not visualized. FINDINGS  Left Ventricle: Left ventricular ejection fraction, by estimation, is 65 to 70%. The left ventricle has normal function. The left ventricle has no regional wall motion abnormalities. Definity contrast agent was given IV to delineate the left ventricular  endocardial borders. The left ventricular internal cavity size was normal in size. There is no left ventricular  hypertrophy. Left ventricular diastolic parameters are consistent with Grade I diastolic dysfunction (impaired relaxation). Right Ventricle: The right ventricular size is not well visualized. Right vetricular wall thickness was not well visualized. Right ventricular systolic function was not well visualized. Left Atrium: Left atrial size was normal in size. Right Atrium: Right atrial size was normal in size. Pericardium: There is no evidence of pericardial effusion. Mitral Valve: The mitral valve was not well visualized. No  evidence of mitral valve regurgitation. Tricuspid Valve: The tricuspid valve is not well visualized. Tricuspid valve regurgitation is not demonstrated. Aortic Valve: The aortic valve was not well visualized. Aortic valve regurgitation is not visualized. Pulmonic Valve: The pulmonic valve was not well visualized. Pulmonic valve regurgitation is not visualized. Aorta: The aortic root was not well visualized. IAS/Shunts: The atrial septum is grossly normal. Additional Comments: Technically difficult echo with poor image quality.   Diastology LV e' medial:    3.70 cm/s LV E/e' medial:  19.7 LV e' lateral:   6.53 cm/s LV E/e' lateral: 11.1  LEFT ATRIUM             Index LA Vol (A2C):   24.4 ml 9.48 ml/m LA Vol (A4C):   35.7 ml 13.87 ml/m LA Biplane Vol: 30.3 ml 11.77 ml/m  AORTIC VALVE LVOT Vmax:   93.70 cm/s LVOT Vmean:  73.200 cm/s LVOT VTI:    0.158 m MITRAL VALVE MV Area (PHT): 3.60 cm     SHUNTS MV Decel Time: 211 msec     Systemic VTI: 0.16 m MV E velocity: 72.80 cm/s MV A velocity: 119.00 cm/s MV E/A ratio:  0.61 Mertie Moores MD Electronically signed by Mertie Moores MD Signature Date/Time: 02/15/2020/1:23:56 PM    Final     Microbiology: Recent Results (from the past 240 hour(s))  Respiratory Panel by RT PCR (Flu A&B, Covid) - Nasopharyngeal Swab     Status: None   Collection Time: 02/14/20 10:45 PM   Specimen: Nasopharyngeal Swab  Result Value Ref Range Status   SARS Coronavirus 2 by RT PCR NEGATIVE NEGATIVE Final    Comment: (NOTE) SARS-CoV-2 target nucleic acids are NOT DETECTED.  The SARS-CoV-2 RNA is generally detectable in upper respiratoy specimens during the acute phase of infection. The lowest concentration of SARS-CoV-2 viral copies this assay can detect is 131 copies/mL. A negative result does not preclude SARS-Cov-2 infection and should not be used as the sole basis for treatment or other patient management decisions. A negative result may occur with  improper specimen  collection/handling, submission of specimen other than nasopharyngeal swab, presence of viral mutation(s) within the areas targeted by this assay, and inadequate number of viral copies (<131 copies/mL). A negative result must be combined with clinical observations, patient history, and epidemiological information. The expected result is Negative.  Fact Sheet for Patients:  PinkCheek.be  Fact Sheet for Healthcare Providers:  GravelBags.it  This test is no t yet approved or cleared by the [place] FDA and  has been authorized for detection and/or diagnosis of SARS-CoV-2 by FDA under an Emergency Use Authorization (EUA). This EUA will remain  in effect (meaning this test can be used) for the duration of the COVID-19 declaration under Section 564(b)(1) of the Act, 21 U.S.C. section 360bbb-3(b)(1), unless the authorization is terminated or revoked sooner.     Influenza A by PCR NEGATIVE NEGATIVE Final   Influenza B by PCR NEGATIVE NEGATIVE Final    Comment: (NOTE) The Xpert Xpress SARS-CoV-2/FLU/RSV assay is intended as an  aid in  the diagnosis of influenza from Nasopharyngeal swab specimens and  should not be used as a sole basis for treatment. Nasal washings and  aspirates are unacceptable for Xpert Xpress SARS-CoV-2/FLU/RSV  testing.  Fact Sheet for Patients: PinkCheek.be  Fact Sheet for Healthcare Providers: GravelBags.it  This test is not yet approved or cleared by the [place] FDA and  has been authorized for detection and/or diagnosis of SARS-CoV-2 by  FDA under an Emergency Use Authorization (EUA). This EUA will remain  in effect (meaning this test can be used) for the duration of the  Covid-19 declaration under Section 564(b)(1) of the Act, 21  U.S.C. section 360bbb-3(b)(1), unless the authorization is  terminated or revoked. Performed at [facility], [place] [address] [place], [place] [address]      Labs: Basic Metabolic Panel: Recent Labs  Lab 02/14/20 1624 02/17/20 1151 02/18/20 0748  NA 141 137 137  K 4.5 4.7 4.2  CL 106 105 107  CO2 25 23 22   GLUCOSE 166* 212* 161*  BUN 31* 33* 29*  CREATININE 1.48* 1.59* 1.44*  CALCIUM 9.0 8.8* 8.5*   Liver Function Tests: No results for input(s): AST, ALT, ALKPHOS, BILITOT, PROT, ALBUMIN in the last 168 hours. No results for input(s): LIPASE, AMYLASE in the last 168 hours. No results for input(s): AMMONIA in the last 168 hours. CBC: Recent Labs  Lab 02/14/20 1624 02/14/20 1624 02/15/20 0636 02/16/20 0128 02/16/20 2116 02/17/20 0125 02/18/20 0748  WBC 12.2*  --  15.7* 14.9*  --  12.1* 12.5*  HGB 8.8*   < > 8.7* 7.7* 9.3* 9.0* 8.6*  HCT 29.8*   < > 29.5* 26.1* 30.2* 29.6* 29.0*  MCV 92.3  --  92.8 90.3  --  89.2 90.6  PLT 294  --  277 277  --  268 257   < > = values in this interval not displayed.   Cardiac Enzymes: No results for input(s): CKTOTAL, CKMB, CKMBINDEX, TROPONINI in the last 168 hours. BNP: BNP (last 3 results) No results for input(s): BNP in the last 8760 hours.  ProBNP (last 3 results) No results for input(s): PROBNP in the last 8760 hours.  CBG: Recent Labs  Lab 02/17/20 0614 02/17/20 1119 02/17/20 2009 02/17/20 2345 02/18/20 0457  GLUCAP 128* 201* 248* 254* 198*       Signed:  Calel Pisarski  [facility] 02/18/2020, 11:04 AM

## 2020-02-18 NOTE — Evaluation (Signed)
Physical Therapy Evaluation Patient Details Name: [name] MRN: [id number] DOB: Aug 20, 1934 Today's Date: 02/18/2020   History of Present Illness  Pt adm with chest pain and found to have NSTEMI. PMH - morbid obesity, breast CA, heart failure, DM, PE, CAD, chronic pain, copd, OSA, dementia, gout, covid, c/p cath on 10/12 w/DES.  Clinical Impression  Patient presents with decreased mobility compared to her baseline where she is able to get up with assist to [facility] and to w/c.  Currently mod to max A to roll in bed and limited to in bed mobility due to still with some R UE use restrictions after heart cath.  Feel family capable to assist at home as daughter here helping me move her up in the bed and with plans to assist pt with bath later.  Feel she would benefit from HHPT due to help with returning to prior mobility level and from bariatric hospital bed at home.  PT will sign off as planned d/c today so further PT deferred to [facility] setting.     Follow Up Recommendations Home health PT;Supervision/Assistance - 24 hour    Equipment Recommendations  Hospital bed    Recommendations for Other Services       Precautions / Restrictions Precautions Precautions: Fall Precaution Comments: R UE still limited today after cath per daughter      Mobility  Bed Mobility Overal bed mobility: Needs Assistance Bed Mobility: Rolling Rolling: Max assist;Mod assist         General bed mobility comments: using rail to roll R with L UE pulling on rail, rolling to L unable to use R UE to pull due to still with some restrictions from heart cath yesterday per daughter so needed max A; +2 total A to scoot up in bed  Transfers                 General transfer comment: NT due to still with restricted use L UE per daughter  Ambulation/Gait                Stairs            Wheelchair Mobility    Modified Rankin (Stroke Patients Only)       Balance                                              Pertinent Vitals/Pain Pain Assessment: Faces Faces Pain Scale: Hurts even more Pain Location: L shoulder with elevation, LE's tender to touch, reports chronic pain in feet and hands and up L side to neck Pain Descriptors / Indicators: Aching;Grimacing;Sharp Pain Intervention(s): Monitored during session;Repositioned;Limited activity within patient's tolerance    Home Living Family/patient expects to be discharged to:: Private residence Living Arrangements: Children Available Help at Discharge: Family;Available 24 hours/day Type of Home: House Home Access: Level entry     Home Layout: Two level;Able to live on main level with bedroom/bathroom Home Equipment: Gilford Rile - 2 wheels;Walker - 4 wheels;Shower seat - built in;Bedside commode;Wheelchair - manual;Grab bars - tub/shower Additional Comments: BSC    Prior Function Level of Independence: Needs assistance   Gait / Transfers Assistance Needed: daughters  help to get to [facility] and to w/c  ADL's / Homemaking Assistance Needed: daughters assist with all ADL except pt able to feed herself        Hand Dominance  Dominant Hand: Right    Extremity/Trunk Assessment   Upper Extremity Assessment Upper Extremity Assessment: LUE deficits/detail;RUE deficits/detail RUE Deficits / Details: AROM WFL, strength 4-/5 except weak grip RUE Sensation: decreased light touch (numbness more in middle two fingers, but also numb other fingers a thumb) LUE Deficits / Details: c/o pain with shoulder elevation about about 45 degrees, AAROM able to lift to about 95 degrees with slight improved pain with passive lifting compared to active; elbow flexion AROM WFL but painful, strength grossly 3+/5 LUE Sensation: decreased light touch (numbness middle two fingers more than rest of hand)    Lower Extremity Assessment Lower Extremity Assessment: RLE deficits/detail;LLE deficits/detail RLE Deficits / Details: AROM grossly WFL;  strength hip flexion 2+/5, knee extension 3+/5, ankle DF 4-/5 LLE Deficits / Details: AROM grossly WFL, strength hip flexion 2+/5, knee extension 3-/5, ankle DF 4/5       Communication   Communication: HOH  Cognition Arousal/Alertness: Awake/alert Behavior During Therapy: WFL for tasks assessed/performed Overall Cognitive Status: History of cognitive impairments - at baseline                                 General Comments: daughter in the room attending to pt and reminding her she would wash her up later when pt c/o itching      General Comments General comments (skin integrity, edema, etc.): pt soiled with urine so changed pad with rolling and assisted for hygiene, pt fatigued with activity, but VSS    Exercises     Assessment/Plan    PT Assessment All further PT needs can be met in the next venue of care  PT Problem List         PT Treatment Interventions      PT Goals (Current goals can be found in the Care Plan section)  Acute Rehab PT Goals Patient Stated Goal: to return home today PT Goal Formulation: With patient/family    Frequency     Barriers to discharge        Co-evaluation               AM-PAC PT "6 Clicks" Mobility  Outcome Measure Help needed turning from your back to your side while in a flat bed without using bedrails?: Total Help needed moving from lying on your back to sitting on the side of a flat bed without using bedrails?: Total Help needed moving to and from a bed to a chair (including a wheelchair)?: Total Help needed standing up from a chair using your arms (e.g., wheelchair or bedside chair)?: Total Help needed to walk in hospital room?: Total Help needed climbing 3-5 steps with a railing? : Total 6 Click Score: 6    End of Session   Activity Tolerance: Patient limited by fatigue Patient left: in bed;with call bell/phone within reach;with family/visitor present   PT Visit Diagnosis: Other abnormalities of gait  and mobility (R26.89);Muscle weakness (generalized) (M62.81)    Time: 5993-5701 PT Time Calculation (min) (ACUTE ONLY): 32 min   Charges:   PT Evaluation $PT Eval Moderate Complexity: 1 Mod PT Treatments $Therapeutic Activity: 8-22 mins        [name], PT Acute Rehabilitation Services XBLTJ:[phone] Office:[phone] 02/18/2020   [name] 02/18/2020, 2:06 PM

## 2020-02-18 NOTE — Discharge Instructions (Signed)

## 2020-02-18 NOTE — Plan of Care (Signed)
  Problem: Education: Goal: Understanding of cardiac disease, CV risk reduction, and recovery process will improve Outcome: Progressing Goal: Understanding of medication regimen will improve Outcome: Progressing Goal: Individualized Educational Video(s) Outcome: Progressing   Problem: Health Behavior/Discharge Planning: Goal: Ability to safely manage health-related needs after discharge will improve Outcome: Progressing   Problem: Cardiovascular: Goal: Vascular access site(s) Level 0-1 will be maintained Outcome: Progressing   Problem: Health Behavior/Discharge Planning: Goal: Ability to safely manage health-related needs after discharge will improve Outcome: Progressing   Problem: Education: Goal: Knowledge of General Education information will improve Description: Including pain rating scale, medication(s)/side effects and non-pharmacologic comfort measures Outcome: Progressing   Problem: Clinical Measurements: Goal: Ability to maintain clinical measurements within normal limits will improve Outcome: Progressing Goal: Will remain free from infection Outcome: Progressing

## 2020-02-18 NOTE — Progress Notes (Addendum)
Progress Note  Patient Name: [name] Date of Encounter: 02/18/2020  [facility] Cardiologist: Ena Dawley, MD   Subjective   Feeling much better this morning.   Inpatient Medications    Scheduled Meds: . sodium chloride   Intravenous Once  . anastrozole  1 mg Oral Daily  . aspirin EC  81 mg Oral Daily  . clopidogrel  75 mg Oral Q breakfast  . diclofenac Sodium  2 g Topical TID  . DULoxetine  60 mg Oral Daily  . febuxostat  40 mg Oral Daily  . heparin injection (subcutaneous)  5,000 Units Subcutaneous Q8H  . hydrALAZINE  50 mg Oral BID  . [START ON 02/19/2020] influenza vaccine adjuvanted  0.5 mL Intramuscular Tomorrow-1000  . insulin aspart  0-20 Units Subcutaneous TID WC  . insulin aspart  0-5 Units Subcutaneous QHS  . insulin glargine  38 Units Subcutaneous Daily  . isosorbide mononitrate  60 mg Oral Daily  . memantine  10 mg Oral Daily   And  . memantine  5 mg Oral QHS  . metoprolol succinate  50 mg Oral Daily  . mometasone-formoterol  2 puff Inhalation BID  . pantoprazole  40 mg Oral Daily  . pravastatin  20 mg Oral QHS  . sodium chloride flush  3 mL Intravenous Q12H  . sodium chloride flush  3 mL Intravenous Q12H  . topiramate  25 mg Oral Daily   Continuous Infusions: . sodium chloride     PRN Meds: sodium chloride, acetaminophen, albuterol, fluticasone, furosemide, guaiFENesin, magnesium hydroxide, ondansetron (ZOFRAN) IV, oxyCODONE, sodium chloride flush   Vital Signs    Vitals:   02/17/20 2012 02/17/20 2347 02/18/20 0444 02/18/20 0810  BP: (!) 143/60 (!) 142/55 (!) 141/65   Pulse: 87 82 79   Resp: 18 (!) 22 20   Temp: 97.8 F (36.6 C) 98.6 F (37 C) 98.6 F (37 C)   TempSrc: Oral Oral Oral   SpO2: 96% 95% 93% 98%  Weight:      Height:        Intake/Output Summary (Last 24 hours) at 02/18/2020 0833 Last data filed at 02/18/2020 0442 Gross per 24 hour  Intake 525.77 ml  Output 1100 ml  Net -574.23 ml   Last 3 Weights  02/14/2020 12/23/2019 09/12/2019  Weight (lbs) 353 lb 329 lb 320 lb  Weight (kg) 160.12 kg 149.233 kg 145.151 kg      Telemetry    SR - Personally Reviewed  ECG    SR -74bpm - Personally Reviewed  Physical Exam  Pleasant older AAF GEN: No acute distress.   Neck: No JVD Cardiac: RRR, no murmurs, rubs, or gallops.  Respiratory: Clear to auscultation bilaterally. GI: Soft, nontender, non-distended  MS: No edema; No deformity. Right radial cath site stable.  Neuro:  Nonfocal  Psych: Normal affect   Labs    High Sensitivity Troponin:   Recent Labs  Lab 02/14/20 1624 02/14/20 2037 02/15/20 1144 02/17/20 1151  TROPONINIHS 44* 81* 242* 162*      Chemistry Recent Labs  Lab 02/14/20 1624 02/17/20 1151  NA 141 137  K 4.5 4.7  CL 106 105  CO2 25 23  GLUCOSE 166* 212*  BUN 31* 33*  CREATININE 1.48* 1.59*  CALCIUM 9.0 8.8*  GFRNONAA 32* 30*  ANIONGAP 10 9     Hematology Recent Labs  Lab 02/16/20 0128 02/16/20 0128 02/16/20 2116 02/17/20 0125 02/18/20 0748  WBC 14.9*  --   --  12.1*  12.5*  RBC 2.89*  --   --  3.32* 3.20*  HGB 7.7*   < > 9.3* 9.0* 8.6*  HCT 26.1*   < > 30.2* 29.6* 29.0*  MCV 90.3  --   --  89.2 90.6  MCH 26.6  --   --  27.1 26.9  MCHC 29.5*  --   --  30.4 29.7*  RDW 14.3  --   --  14.5 14.2  PLT 277  --   --  268 257   < > = values in this interval not displayed.    BNPNo results for input(s): BNP, PROBNP in the last 168 hours.   DDimer No results for input(s): DDIMER in the last 168 hours.   Radiology    DG Chest 1 View  Result Date: 02/17/2020 CLINICAL DATA:  Chest pain EXAM: CHEST  1 VIEW COMPARISON:  02/14/2020 FINDINGS: Trachea midline. Cardiomediastinal contours with mild cardiac enlargement. Mild elevation of the RIGHT hemidiaphragm and low lung volumes without lobar consolidation or sign of pleural effusion on frontal view. On limited assessment no acute skeletal process. IMPRESSION: Mild cardiac enlargement. No acute  cardiopulmonary disease. Electronically Signed   By: Zetta Bills M.D.   On: 02/17/2020 10:58   CARDIAC CATHETERIZATION  Result Date: 09/32/6712  LV end diastolic pressure is mildly elevated.  ------------------------------  Mid LM to Prox LAD lesion is 30% stenosed with 30% stenosed side branch in Ost Cx.  Prox LAD to Mid LAD lesion is 45% stenosed with 25% stenosed side branch in 1st Diag.  1st Mrg lesion is 100% stenosed.  ----------------  Colon Flattery RCA to Prox RCA stent is 10% stenosed.  Prox RCA to Mid RCA stent is 5% stenosed.  Prox RCA lesion is 95% stenosed, between the 2 old stents  A drug-eluting stent was successfully placed using a STENT RESOLUTE ONYX 4.0X18 -> postdilated to 4.6-4.7 mm  Post intervention, there is a 0% residual stenosis.  ----------------  Mid RCA lesion is 85% stenosed after the second stent  A drug-eluting stent was successfully placed using a STENT RESOLUTE ONYX 3.0X18 -> tapered post dilation from 4.2-3.3 mm  Post intervention, there is a 0% residual stenosis.  SUMMARY  Multivessel CAD with severe (95%) IntraStent stenosis in the proximal RCA and de novo stenosis distal to mid RCA stent (85%) -> both treated with DES PCI using Resolute Onyx DES stents (distal-3.0 mm x 18 mm with tapered post dilation 4.2-3.3 mm; proximal 4.0 mm 18 mm postdilated to 4.7 mm.)  Diffuse mild to moderate calcified disease in the distal Left Main as well as ostial and proximal LCx and LAD with no severe stenoses.  Borderline elevated LVEDP with severe systemic hypertension RECOMMENDATIONS  Return to nursing unit for ongoing care.  Okay to DC heparin.  We will need to determine plus or minus the use of Xarelto plus Plavix.  For now would continue aspirin plus Plavix until discharge and then discontinue aspirin on discharge with Xarelto plus Plavix for 3-6 months.  Continue to monitor anemia and other risk factors. Discussed with Dr. Angelena Form. Glenetta Hew, MD   Cardiac Studies    Echo: 02/15/20  IMPRESSIONS    1. Technically difficult echo with poor image quality.  2. Left ventricular ejection fraction, by estimation, is 65 to 70%. The  left ventricle has normal function. The left ventricle has no regional  wall motion abnormalities. Left ventricular diastolic parameters are  consistent with Grade I diastolic  dysfunction (impaired relaxation).  3.  Right ventricular systolic function was not well visualized. The right  ventricular size is not well visualized.  4. The mitral valve was not well visualized. No evidence of mitral valve  regurgitation.  5. The aortic valve was not well visualized. Aortic valve regurgitation  is not visualized.   Cath: 69/62/95   LV end diastolic pressure is mildly elevated.  ------------------------------  Mid LM to Prox LAD lesion is 30% stenosed with 30% stenosed side branch in Ost Cx.  Prox LAD to Mid LAD lesion is 45% stenosed with 25% stenosed side branch in 1st Diag.  1st Mrg lesion is 100% stenosed.  ----------------  Colon Flattery RCA to Prox RCA stent is 10% stenosed.  Prox RCA to Mid RCA stent is 5% stenosed.  Prox RCA lesion is 95% stenosed, between the 2 old stents  A drug-eluting stent was successfully placed using a STENT RESOLUTE ONYX 4.0X18 -> postdilated to 4.6-4.7 mm  Post intervention, there is a 0% residual stenosis.  ----------------  Mid RCA lesion is 85% stenosed after the second stent  A drug-eluting stent was successfully placed using a STENT RESOLUTE ONYX 3.0X18 -> tapered post dilation from 4.2-3.3 mm  Post intervention, there is a 0% residual stenosis.   SUMMARY  Multivessel CAD with severe (95%) IntraStent stenosis in the proximal RCA and de novo stenosis distal to mid RCA stent (85%) -> both treated with DES PCI using Resolute Onyx DES stents (distal-3.0 mm x 18 mm with tapered post dilation 4.2-3.3 mm; proximal 4.0 mm 18 mm postdilated to 4.7 mm.)  Diffuse mild to moderate  calcified disease in the distal Left Main as well as ostial and proximal LCx and LAD with no severe stenoses.  Borderline elevated LVEDP with severe systemic hypertension   RECOMMENDATIONS  Return to nursing unit for ongoing care.  Okay to DC heparin.  We will need to determine plus or minus the use of Xarelto plus Plavix.  For now would continue aspirin plus Plavix until discharge and then discontinue aspirin on discharge with Xarelto plus Plavix for 3-6 months.  Continue to monitor anemia and other risk factors.  Discussed with Dr. Angelena Form.  Diagnostic Dominance: Right  Intervention     Patient Profile     85 y.o. female [place] ofmorbid obesity, CAD (s/p BMS to RCA 2004, s/p DES to RCA 2016), HFpEF, DM2, GERD, chronic pain, prior breast CA and anemia presentswithchest pain and was found tohave elevated troponin.  Assessment & Plan    1. NSTEMI: hsTn peaked at 242. Underwent cardiac cath yesterday noted above with multivessel CAD but severe ISR in pRCA and m/dRCA lesion both treated with DES. Diffuse mild to moderate disease in the LM as well as Lcx and LAD but nonobstructive. Plan for DAPT with ASA/plavix. If decision is made to resume Xarelto, will need to stop ASA at discharge. Though with ongoing anemia, would favor stopping prophylactic Xarelto dose. -- on ASA, plavix, statin, Imdur and BB -- CR ordered  3. Anemia: baseline appears around 9-10. Has noted a decline since admission to 7.7. Improved to 9.0 with 1 unit PRBCs, down to 8.6 today though did receive IVFs pre cath so may be somewhat dilutional.  -- denies any dark tarry stools prior to admission, reports normal colonoscopy in the past   4. DM:Hgb A1c 8.9 -- SSI while inpatient  5. Dementia:on namenda.  6. PE/DVT:has been on Xarelto 10mg  daily. Question whether this can be stopped? Primary to discuss with hematology.   7. CKD III:  baseline around 1.5-1.6, BMET pending   Will arrange for  outpatient TOC follow up  For questions or updates, please contact [place] Please consult [email or web address] for contact info under        Signed, Reino Bellis, NP  02/18/2020, 8:33 AM    I have personally seen and examined this patient. I agree with the assessment and plan as outlined above.  She is doing well post PCI/stenting of the RCA yesterday Will continue ASA and Plavix. If the decision is made to resume Xarelto, agree that I would not continue ASA. Hopefully her Xarelto can be discontinued if there is no clear indication.  Continue statin, Imdur and beta blocker.  We will arrange follow up in our office.  We will sign off. Please call with questions.   [name] 02/18/2020 9:20 AM

## 2020-02-18 NOTE — Progress Notes (Signed)
CARDIAC REHAB PHASE I   MI and stent education completed with pt and daughter. Pt educated on importance of Plavix and NTG. Pt given MI book along with heart healthy diet. Pt states inability to ambulate at home, so deferred ambulation to PT. Encouraged moving as able. Pt referred to CRP II [place] to fulfill the requirements, but pt unable to participate. Will continue to follow.  [id number] Rufina Falco, RN BSN 02/18/2020 9:25 AM

## 2020-02-18 NOTE — TOC Initial Note (Addendum)
Transition of Care [facility]) - Initial/Assessment Note    Patient Details  Name: [name] MRN: [id number] Date of Birth: 24-Aug-1934  Transition of Care [facility]) CM/SW Contact:    Zenon Mayo, RN Phone Number: 02/18/2020, 12:06 PM  Clinical Narrative:                 Patient is from home, she has a daughter that lives with her, she is for dc today, she will need lightwieght wheelchair and hospital bed, which will be delivered to the home.  Daughter wants to go with [employer].  NCM made referral to [facility]  Also NCM offered choice for [facility] agency, daughter does not have a preference.  NCM made referral to [facility] with Alvis Lemmings , he is able to take referral for HHPT, HHAIDE.  Soc will begin 24 to 48 hrs post dc.   Expected Discharge Plan: [place] Barriers to Discharge: No Barriers Identified   Patient Goals and CMS Choice Patient states their goals for this hospitalization and ongoing recovery are:: get better CMS Medicare.gov Compare Post Acute Care list provided to:: Patient Represenative (must comment) Choice offered to / list presented to : Adult Children  Expected Discharge Plan and Services Expected Discharge Plan: [place]   Discharge Planning Services: CM Consult Post Acute Care Choice: Durable Medical Equipment, Home Health Living arrangements for the past 2 months: Single Family Home Expected Discharge Date: 02/18/20               DME Arranged: Youth worker wheelchair with seat cushion, Hospital bed DME Agency: [place] Date DME Agency Contacted: 02/18/20 Time DME Agency Contacted: 1205 Representative spoke with at DME Agency: Brenton Grills HH Arranged: PT, Nurse's Aide HH Agency: [place] Date [facility] Agency Contacted: 02/18/20 Time HH Agency Contacted: 1205 Representative spoke with at [place]: Brenton Grills  Prior Living Arrangements/Services Living arrangements for the past 2 months: [place] Lives with:: Adult Children Patient language and need for interpreter reviewed:: Yes Do you feel safe going back to the place where you live?: Yes      Need for Family Participation in Patient Care: Yes (Comment) Care giver support system in place?: Yes (comment)   Criminal Activity/Legal Involvement Pertinent to Current Situation/Hospitalization: No - Comment as needed  Activities of Daily Living   ADL Screening (condition at time of admission) Patient's cognitive ability adequate to safely complete daily activities?: Yes Is the patient deaf or have difficulty hearing?: No Does the patient have difficulty seeing, even when wearing glasses/contacts?: No Does the patient have difficulty concentrating, remembering, or making decisions?: No Patient able to express need for assistance with ADLs?: Yes Does the patient have difficulty dressing or bathing?: Yes Independently performs ADLs?: No Does the patient have difficulty walking or climbing stairs?: Yes Weakness of Legs: Both Weakness of Arms/Hands: None  Permission Sought/Granted                  Emotional Assessment Appearance:: Appears stated age Attitude/Demeanor/Rapport: Engaged Affect (typically observed): Appropriate Orientation: : Oriented to Self, Oriented to Place, Oriented to  Time, Oriented to Situation Alcohol / Substance Use: Not Applicable Psych Involvement: No (comment)  Admission diagnosis:  Renal insufficiency [N28.9] Chest pain on exertion [R07.9] Non-STEMI (non-ST elevated myocardial infarction) ([place]) [I21.4] NSTEMI (non-ST elevated myocardial infarction) ([place]) [I21.4] Normochromic normocytic anemia [D64.9] Patient Active Problem List   Diagnosis Date Noted  . Presence of stent in right  coronary artery   . Non-STEMI (non-ST elevated myocardial infarction) ([place]) 02/15/2020  . Chest pain on exertion 02/14/2020  . Follow-up examination after eye surgery 08/27/2019  . Right epiretinal membrane  08/27/2019  . Vitreomacular adhesion of right eye 08/27/2019  . Diabetes mellitus without complication ([place]) 77/82/4235  . Intermediate stage nonexudative age-related macular degeneration of both eyes 08/27/2019  . Moderate nonproliferative diabetic retinopathy of both eyes without macular edema associated with type 2 diabetes mellitus ([place]) 08/27/2019  . Pulmonary embolus ([place]) 07/15/2019  . Type 2 diabetes mellitus with hyperglycemia, with long-term current use of insulin ([place]) 03/25/2019  . Pneumonia due to COVID-19 virus 01/25/2019  . Type 2 diabetes mellitus with diabetic neuropathy, with long-term current use of insulin ([place]) 12/13/2017  . CHF (congestive heart failure) ([place]) 10/08/2017  . Hyperkalemia 07/26/2017  . Anxiety associated with depression 06/08/2017  . GERD with esophagitis 11/15/2016  . Memory loss 11/15/2016  . Multiple benign nevi 11/15/2016  . Esophageal dysphagia 11/07/2016  . Severe episode of recurrent major depressive disorder, without psychotic features ([place]) 06/23/2016  . Endometrial polyp 06/23/2016  . Vaginitis and vulvovaginitis 06/23/2016  . Cough variant asthma 11/17/2015  . Vitamin D deficiency 08/02/2015  . Breast cancer ([place]) 04/26/2015  . Dyslipidemia associated with type 2 diabetes mellitus ([place]) 02/06/2015  . Type 2 diabetes mellitus with stage 3b chronic kidney disease, with long-term current use of insulin ([place]) 02/06/2015  . Chronic pain syndrome 02/06/2015  . Morbid obesity with BMI of 50.0-59.9, adult ([place]) 12/18/2014  . CKD (chronic kidney disease) stage 3, GFR 30-59 ml/min (HCC) 12/18/2014  . Gout 10/27/2014  . Breast mass 10/27/2014  . Encounter for therapeutic drug monitoring 10/08/2014  . Abnormal stress test 09/27/2014  . Vertigo 09/24/2014  . Chronic bronchitis ([place]) 09/23/2014  . Preoperative clearance 08/16/2014  . DOE (dyspnea on exertion) 06/24/2014  . Postmenopausal bleeding 06/04/2014  . CN (constipation) 05/10/2014  .  History of pulmonary embolus (PE) 04/22/2014  . COPD (chronic obstructive pulmonary disease) ([place]) 04/21/2014  . Estrogen deficiency 02/19/2014  . Breast cancer screening 02/19/2014  . Weight gain 02/19/2014  . Pain in the chest 12/31/2013  . Type 2 diabetes mellitus with diabetic foot deformity ([place]) 07/29/2013  . OSA (obstructive sleep apnea) 02/24/2013  . Need for prophylactic vaccination and inoculation against influenza 01/08/2013  . Insomnia 09/17/2012  . Debility 08/01/2012  . Osteoarthritis 08/01/2012  . Dysphagia, unspecified(787.20) 07/31/2012  . GERD (gastroesophageal reflux disease) 07/31/2012  . Long term current use of anticoagulant therapy 02/02/2012  . Chronic diastolic congestive heart failure ([place]) 01/29/2012  . History of deep venous thrombosis 01/27/2012  . Abdominal pain 01/26/2012  . Cholelithiasis 06/06/2010  . Situational depression 06/04/2009  . ORTHOPNEA 03/09/2009  . Morbid (severe) obesity due to excess calories ([place]) 02/15/2009  . Microcytic anemia 02/15/2009  . TENSION HEADACHE 01/07/2008  . Headache 01/07/2008  . Fungal dermatitis 11/13/2007  . CAD (coronary artery disease) 08/29/2007  . URINARY INCONTINENCE 08/29/2007  . Hyperlipidemia 12/03/2006  . Essential hypertension 12/03/2006  . Personal history of other diseases of the digestive system 12/03/2006   PCP:  [name], NP Pharmacy:   [facility] [address] [place] [address] Phone: [phone] Fax: [phone]  [facility] - [place], [place] - [address] [place] [address] Phone: [phone] Fax: [phone]     Social Determinants of Health (SDOH) Interventions  Readmission Risk Interventions Readmission Risk Prevention Plan 02/18/2020 01/27/2019  Transportation Screening Complete Complete  Medication Review Press photographer) Complete Complete  PCP or  Specialist appointment within 3-5 days of discharge Complete Complete  HRI or Home Care Consult Complete Complete  SW Recovery Care/Counseling Consult Complete Complete  Palliative Care Screening Not Applicable Not [place] Not Applicable Not Applicable  Some recent data might be hidden

## 2020-02-18 NOTE — Care Management Important Message (Signed)
  Important Message  Patient Details  Name: [name] MRN: [id number] Date of Birth: 1935-01-17   Medicare Important Message Given:  Yes     Orbie Pyo 02/18/2020, 3:07 PM

## 2020-02-18 NOTE — TOC Transition Note (Signed)
Transition of Care [facility]) - CM/SW Discharge Note   Patient Details  Name: [name] MRN: [id number] Date of Birth: 10-30-1934  Transition of Care [facility] CM/SW Contact:  Zenon Mayo, RN Phone Number: 02/18/2020, 12:11 PM   Clinical Narrative:    Patient is from home, she has a daughter that lives with her, she is for dc today, she will need lightwieght wheelchair and hospital bed, which will be delivered to the home.  Daughter wants to go with [employer].  NCM made referral to [facility].  Also NCM offered choice for [facility] agency, daughter does not have a preference.  NCM made referral to [facility] with Alvis Lemmings , he is able to take referral for HHPT, HHAIDE.  Soc will begin 24 to 48 hrs post dc.     Final next level of care: [place] Barriers to Discharge: No Barriers Identified   Patient Goals and CMS Choice Patient states their goals for this hospitalization and ongoing recovery are:: to get better CMS Medicare.gov Compare Post Acute Care list provided to:: Patient Represenative (must comment) Choice offered to / list presented to : Adult Children  Discharge Placement                       Discharge Plan and Services   Discharge Planning Services: CM Consult Post Acute Care Choice: Durable Medical Equipment, Home Health          DME Arranged: Lightweight manual wheelchair with seat cushion, Hospital bed DME Agency: [place] Date DME Agency Contacted: 02/18/20 Time DME Agency Contacted: 1205 Representative spoke with at DME Agency: Brenton Grills HH Arranged: PT, Nurse's Aide HH Agency: [place] Date [place]: 02/18/20 Time [place]: 1205 Representative spoke with at [place]: [place] ([place]) Interventions     Readmission Risk Interventions Readmission Risk Prevention Plan 02/18/2020 01/27/2019  Transportation Screening Complete Complete  Medication Review Human resources officer) Complete Complete  PCP or Specialist appointment within 3-5 days of discharge Complete Complete  HRI or [place] Complete Complete  SW Recovery Care/Counseling Consult Complete Complete  Palliative Care Screening Not Applicable Not [place] Not Applicable Not Applicable  Some recent data might be hidden

## 2020-02-19 ENCOUNTER — Telehealth: Payer: Self-pay | Admitting: *Deleted

## 2020-02-19 NOTE — Telephone Encounter (Signed)
Transition Care Management Follow-Up Telephone Call   Date discharged and where:02/18/2020 [place]  How have you been since you were released from the hospital? A lot Better  Any patient concerns? No  Items Reviewed:   Meds: Yes  Allergies:Yes  Dietary Changes Reviewed:Yes  Functional Questionnaire:  Independent-I Dependent-D  ADLs:D. Daughter and wheelchair and Hospital bed   Dressing- I with Assistance    Eating-I   Maintaining continence-I   Transferring-I with assistance   Transportation-D   Meal Prep-D   Managing Meds- I with assistance   Confirmed importance and Date/Time of follow-up visits scheduled:02/23/2020 with [name]   Confirmed with patient if condition worsens to call PCP or go to the Emergency Dept. Patient was given office number and encouraged to call back with questions or concerns: Yes

## 2020-02-20 ENCOUNTER — Other Ambulatory Visit: Payer: Self-pay

## 2020-02-20 ENCOUNTER — Ambulatory Visit
Admit: 2020-02-20 | Discharge: 2020-02-20 | Disposition: A | Discharge status: Home / Self Care | Payer: PPO | Attending: Nurse Practitioner | Admitting: Nurse Practitioner

## 2020-02-20 DIAGNOSIS — R921 Mammographic calcification found on diagnostic imaging of breast: Secondary | ICD-10-CM | POA: Diagnosis not present

## 2020-02-20 DIAGNOSIS — Z1231 Encounter for screening mammogram for malignant neoplasm of breast: Secondary | ICD-10-CM

## 2020-02-23 ENCOUNTER — Inpatient Hospital Stay: Payer: PPO | Admitting: Hematology

## 2020-02-23 ENCOUNTER — Other Ambulatory Visit: Payer: Self-pay

## 2020-02-23 ENCOUNTER — Other Ambulatory Visit: Payer: Self-pay | Admitting: *Deleted

## 2020-02-23 ENCOUNTER — Ambulatory Visit (INDEPENDENT_AMBULATORY_CARE_PROVIDER_SITE_OTHER): Payer: PPO | Admitting: Nurse Practitioner

## 2020-02-23 ENCOUNTER — Encounter: Payer: Self-pay | Admitting: Nurse Practitioner

## 2020-02-23 ENCOUNTER — Inpatient Hospital Stay: Payer: PPO | Attending: Hematology

## 2020-02-23 VITALS — BP 128/80 | HR 79 | Temp 95.7°F | Ht 67.0 in | Wt 338.0 lb

## 2020-02-23 VITALS — BP 168/73 | HR 83 | Temp 98.1°F | Resp 18 | Ht 67.0 in | Wt 338.1 lb

## 2020-02-23 DIAGNOSIS — G629 Polyneuropathy, unspecified: Secondary | ICD-10-CM

## 2020-02-23 DIAGNOSIS — I10 Essential (primary) hypertension: Secondary | ICD-10-CM | POA: Insufficient documentation

## 2020-02-23 DIAGNOSIS — I2583 Coronary atherosclerosis due to lipid rich plaque: Secondary | ICD-10-CM

## 2020-02-23 DIAGNOSIS — C50812 Malignant neoplasm of overlapping sites of left female breast: Secondary | ICD-10-CM | POA: Diagnosis not present

## 2020-02-23 DIAGNOSIS — D649 Anemia, unspecified: Secondary | ICD-10-CM

## 2020-02-23 DIAGNOSIS — E875 Hyperkalemia: Secondary | ICD-10-CM

## 2020-02-23 DIAGNOSIS — Z7189 Other specified counseling: Secondary | ICD-10-CM

## 2020-02-23 DIAGNOSIS — Z7982 Long term (current) use of aspirin: Secondary | ICD-10-CM | POA: Insufficient documentation

## 2020-02-23 DIAGNOSIS — C50112 Malignant neoplasm of central portion of left female breast: Secondary | ICD-10-CM

## 2020-02-23 DIAGNOSIS — E1140 Type 2 diabetes mellitus with diabetic neuropathy, unspecified: Secondary | ICD-10-CM

## 2020-02-23 DIAGNOSIS — Z833 Family history of diabetes mellitus: Secondary | ICD-10-CM | POA: Diagnosis not present

## 2020-02-23 DIAGNOSIS — Z800 Family history of malignant neoplasm of digestive organs: Secondary | ICD-10-CM | POA: Insufficient documentation

## 2020-02-23 DIAGNOSIS — E119 Type 2 diabetes mellitus without complications: Secondary | ICD-10-CM | POA: Diagnosis not present

## 2020-02-23 DIAGNOSIS — Z803 Family history of malignant neoplasm of breast: Secondary | ICD-10-CM | POA: Insufficient documentation

## 2020-02-23 DIAGNOSIS — Z79899 Other long term (current) drug therapy: Secondary | ICD-10-CM | POA: Insufficient documentation

## 2020-02-23 DIAGNOSIS — Z170 Estrogen receptor positive status [ER+]: Secondary | ICD-10-CM

## 2020-02-23 DIAGNOSIS — I214 Non-ST elevation (NSTEMI) myocardial infarction: Secondary | ICD-10-CM

## 2020-02-23 DIAGNOSIS — Z79811 Long term (current) use of aromatase inhibitors: Secondary | ICD-10-CM | POA: Diagnosis not present

## 2020-02-23 DIAGNOSIS — I2510 Atherosclerotic heart disease of native coronary artery without angina pectoris: Secondary | ICD-10-CM | POA: Diagnosis not present

## 2020-02-23 DIAGNOSIS — Z794 Long term (current) use of insulin: Secondary | ICD-10-CM

## 2020-02-23 DIAGNOSIS — C50919 Malignant neoplasm of unspecified site of unspecified female breast: Secondary | ICD-10-CM | POA: Diagnosis not present

## 2020-02-23 DIAGNOSIS — Z8049 Family history of malignant neoplasm of other genital organs: Secondary | ICD-10-CM | POA: Insufficient documentation

## 2020-02-23 DIAGNOSIS — I5032 Chronic diastolic (congestive) heart failure: Secondary | ICD-10-CM

## 2020-02-23 DIAGNOSIS — R413 Other amnesia: Secondary | ICD-10-CM

## 2020-02-23 DIAGNOSIS — E1165 Type 2 diabetes mellitus with hyperglycemia: Secondary | ICD-10-CM

## 2020-02-23 DIAGNOSIS — Z90710 Acquired absence of both cervix and uterus: Secondary | ICD-10-CM | POA: Insufficient documentation

## 2020-02-23 LAB — CMP (CANCER CENTER ONLY)
ALT: 6 U/L (ref 0–44)
AST: 10 U/L — ABNORMAL LOW (ref 15–41)
Albumin: 2.9 g/dL — ABNORMAL LOW (ref 3.5–5.0)
Alkaline Phosphatase: 84 U/L (ref 38–126)
Anion gap: 6 (ref 5–15)
BUN: 29 mg/dL — ABNORMAL HIGH (ref 8–23)
CO2: 27 mmol/L (ref 22–32)
Calcium: 9.2 mg/dL (ref 8.9–10.3)
Chloride: 109 mmol/L (ref 98–111)
Creatinine: 1.64 mg/dL — ABNORMAL HIGH (ref 0.44–1.00)
GFR, Estimated: 29 mL/min — ABNORMAL LOW (ref >60)
Glucose, Bld: 112 mg/dL — ABNORMAL HIGH (ref 70–99)
Potassium: 4.7 mmol/L (ref 3.5–5.1)
Sodium: 142 mmol/L (ref 135–145)
Total Bilirubin: <0.2 mg/dL — ABNORMAL LOW (ref 0.3–1.2)
Total Protein: 7.9 g/dL (ref 6.5–8.1)

## 2020-02-23 LAB — CBC WITH DIFFERENTIAL/PLATELET
Abs Immature Granulocytes: 0.1 10*3/uL — ABNORMAL HIGH (ref 0.00–0.07)
Basophils Absolute: 0.1 10*3/uL (ref 0.0–0.1)
Basophils Relative: 0 %
Eosinophils Absolute: 0.3 10*3/uL (ref 0.0–0.5)
Eosinophils Relative: 2 %
HCT: 31.7 % — ABNORMAL LOW (ref 36.0–46.0)
Hemoglobin: 9.5 g/dL — ABNORMAL LOW (ref 12.0–15.0)
Immature Granulocytes: 1 %
Lymphocytes Relative: 19 %
Lymphs Abs: 3.1 10*3/uL (ref 0.7–4.0)
MCH: 26.9 pg (ref 26.0–34.0)
MCHC: 30 g/dL (ref 30.0–36.0)
MCV: 89.8 fL (ref 80.0–100.0)
Monocytes Absolute: 0.7 10*3/uL (ref 0.1–1.0)
Monocytes Relative: 5 %
Neutro Abs: 11.6 10*3/uL — ABNORMAL HIGH (ref 1.7–7.7)
Neutrophils Relative %: 73 %
Platelets: 278 10*3/uL (ref 150–400)
RBC: 3.53 MIL/uL — ABNORMAL LOW (ref 3.87–5.11)
RDW: 14.3 % (ref 11.5–15.5)
WBC: 15.8 10*3/uL — ABNORMAL HIGH (ref 4.0–10.5)
nRBC: 0 % (ref 0.0–0.2)

## 2020-02-23 LAB — VITAMIN B12: Vitamin B-12: 677 pg/mL (ref 180–914)

## 2020-02-23 LAB — FERRITIN: Ferritin: 49 ng/mL (ref 11–307)

## 2020-02-23 MED ORDER — PREGABALIN 25 MG PO CAPS: 25.0000 mg | ORAL_CAPSULE | Freq: Two times a day (BID) | ORAL | 3 refills | Status: DC

## 2020-02-23 NOTE — Progress Notes (Signed)
Careteam: Patient Care Team: Nichole Chandler, NP as PCP - General (Geriatric Medicine) Nichole Spark, MD as PCP - Cardiology (Cardiology) [name], Nichole Conception, MD (Inactive) as Consulting Physician (Cardiology) Nichole Jacks, MD as Consulting Physician (Ophthalmology) Nichole Keens, MD as Consulting Physician (General Surgery) Nichole Height, MD (Inactive) as Referring Physician (Obstetrics and Gynecology) [name] Nichole Demark, MD as Consulting Physician (Ophthalmology) [name], Nichole Crazier, MD as Consulting Physician (Endocrinology) Nichole Genera, MD as Consulting Physician (Hematology) Nichole Partridge, DO as Consulting Physician (Neurology)  PLACE OF SERVICE:  [place]  Advanced Directive information    Allergies  Allergen Reactions  . Sulfonamide Derivatives Swelling    Mouth swelling  . Aricept [Donepezil Hcl] Diarrhea and Nausea And Vomiting    GI upset/loose stools  . Tramadol Nausea And Vomiting    Chief Complaint  Patient presents with  . [facility] follow-up 10/9-10/13/2021. Discuss need for Foot exam and TD/Tdap. Discuss medication for potassium.      HPI: Patient is a 85 y.o. female to follow up hospital follow up.  [name] experienced chest pain with movement/exertion, 10/10, with radiation to left upper extremity, jaw, lasting minutes, and associated shortness of breath and headache. She reports she has been having this pain for the last week and today it was the worse, prompting her to call her daughter for help. Pt was admitted to hospital with NSTEMI.  Cardiology consulted and appreciated, status post cardiac catheterization: Multivessel CAD with severe 95% IntraStent stenosis in the proximal RCA and no flow stenosis distal to mid RCA stent 85%, both treated with DES PCI. She as discharged with aspirin and Plavix, statin, metoprolol, Imdur and instructed to follow up with cardiology.   Reports she has not had any more  shortness of breath or chest pains.  PT comes out tomorrow  Reports she is not following dietary modifications.  Reports she is eating "snacky foods"  Such as pork rinds and lots of fruits.   Reports she feels like she is retaining fluid. Says she feels bloated with increase in leg swelling. She is not taking her lasix.   She has follow up scheduled with cardiology next week.   Has follow up with oncology due to breast cancer today.   DM- blood sugar remains elevated, reports she has had low blood sugars (daughter said she has not had a low with her but possibly with sister) they do not have meter today called sister who gave readings over the phone.   207 this morning 201 (notfasting) 139 (fasting) 229 1707 (not fasting) 67 1247pm 153 (fasting) 295 not fasting 83 (fasting) 254 (not fasting) 115 (fasting) Does not eat consistently, will sleep through meal times.   Pt reports she is 85 years old and has had diabetes most of her life and at this point would like to just eat when and what she wants.    Review of Systems:  Review of Systems  Constitutional: Negative for chills, fever and weight loss.  HENT: Negative for tinnitus.   Respiratory: Negative for cough, sputum production and shortness of breath.   Cardiovascular: Negative for chest pain, palpitations and leg swelling.  Gastrointestinal: Negative for abdominal pain, constipation, diarrhea and heartburn.  Genitourinary: Negative for dysuria, frequency and urgency.  Musculoskeletal: Positive for joint pain and myalgias. Negative for back pain and falls.  Skin: Negative.   Neurological: Negative for dizziness and headaches.    Past Medical History:  Diagnosis  Date  . Allergy    takes Mucinex daily as needed  . Anemia, unspecified   . Anxiety    takes Clonazepam daily as needed  . Arthritis   . Cancer (HCC)    breast  . CHF (congestive heart failure) ([place])    takes Furosemide daily  . CKD (chronic kidney  disease)   . Coronary artery disease    a. s/p IMI 2004 tx with BMS to RCA;  b. s/p Promus DES to RCA 2/12 c. 06/2014 High risk stress test follow up cath 06/2014 showd patent stent--< med therapy for other mild to moedrate disease   . Coronary atherosclerosis of native coronary artery   . Depression    takes Cymbalta daily  . Diabetes mellitus    insulin daily  . Dysuria   . GERD (gastroesophageal reflux disease)    takes Protonix daily  . Gout, unspecified    takes Colchicine daily  . Headache    occasionally  . History of blood clots about 86yrs ago   in legs-takes Coumadin   . Hyperlipidemia    takes Pravastatin daily  . Hypertension    takes Imdur and Metoprolol daily  . Insomnia   . Joint pain   . Joint swelling   . Myocardial infarction ([place]) 2004  . Numbness   . Obstructive sleep apnea    does not wear cpap  . Osteoarthritis   . Osteoarthritis   . Osteoarthrosis, unspecified whether generalized or localized, lower leg   . Pain, chronic   . Polymyalgia rheumatica ([place])   . Pulmonary emboli ([place]) 9/13   felt to need lifelong anticoagulation  . Shortness of breath dyspnea    with exertion and has Albuterol inhaler prn  . Type II or unspecified type diabetes mellitus without mention of complication, uncontrolled   . Urinary incontinence    takes Linzess daily  . Vertigo    hx of;was taking Meclizine if needed  . Wears dentures    Past Surgical History:  Procedure Laterality Date  . ABDOMINAL HYSTERECTOMY     partial  . APPENDECTOMY    . blood clots/legs and lungs  2013  . BREAST BIOPSY Left 07/22/2014  . BREAST BIOPSY Left 02/10/2013  . BREAST LUMPECTOMY Left 11/05/2014  . BREAST LUMPECTOMY WITH RADIOACTIVE SEED LOCALIZATION Left 11/05/2014   Procedure: LEFT BREAST LUMPECTOMY WITH RADIOACTIVE SEED LOCALIZATION;  Surgeon: Nichole Keens, MD;  Location: [facility];  Service: General;  Laterality: Left;  . CARDIAC CATHETERIZATION    . COLONOSCOPY    . CORONARY  ANGIOPLASTY  2  . CORONARY STENT INTERVENTION N/A 02/17/2020   Procedure: CORONARY STENT INTERVENTION;  Surgeon: Leonie Man, MD;  Location: [place] [facility];  Service: Cardiovascular;  Laterality: N/A;  . ESOPHAGOGASTRODUODENOSCOPY (EGD) WITH PROPOFOL N/A 11/07/2016   Procedure: ESOPHAGOGASTRODUODENOSCOPY (EGD) WITH PROPOFOL;  Surgeon: Gatha Mayer, MD;  Location: [facility];  Service: Endoscopy;  Laterality: N/A;  . EXCISION OF SKIN TAG Right 11/05/2014   Procedure: EXCISION OF RIGHT EYELID SKIN TAG;  Surgeon: Nichole Keens, MD;  Location: [facility];  Service: General;  Laterality: Right;  . EYE SURGERY Bilateral    cataract   . GASTRIC BYPASS  1977    reversed in 1979, [place] CATH AND CORONARY ANGIOGRAPHY N/A 02/17/2020   Procedure: LEFT HEART CATH AND CORONARY ANGIOGRAPHY;  Surgeon: Leonie Man, MD;  Location: [name] [facility];  Service: Cardiovascular;  Laterality: N/A;  . LEFT  HEART CATHETERIZATION WITH CORONARY ANGIOGRAM N/A 06/29/2014   Procedure: LEFT HEART CATHETERIZATION WITH CORONARY ANGIOGRAM;  Surgeon: Troy Sine, MD;  [facility];  Service: Cardiovascular;  Laterality: N/A;  . MEMBRANE PEEL Right 10/23/2018   Procedure: MEMBRANE PEEL;  Surgeon: Hurman Horn, MD;  Location: [place];  Service: Ophthalmology;  Laterality: Right;  . MI with stent placement  2004  . PARS PLANA VITRECTOMY Right 10/23/2018   Procedure: PARS PLANA VITRECTOMY WITH 25 GAUGE;  Surgeon: Hurman Horn, MD;  Location: [place];  Service: Ophthalmology;  Laterality: Right;   Social History:   reports that she has never smoked. She has never used smokeless tobacco. She reports that she does not drink alcohol and does not use drugs.  Family History  Problem Relation Age of Onset  . Breast cancer Mother 33  . Heart disease Mother   . Throat cancer Father   . Hypertension Father   . Arthritis Father   . Diabetes Father   . Arthritis Sister   . Obesity Sister     . Diabetes Sister   . Heart disease Cousin   . Colon cancer Neg Hx   . Stomach cancer Neg Hx   . Esophageal cancer Neg Hx     Medications: Patient's Medications  New Prescriptions   No medications on file  Previous Medications   ACETAMINOPHEN (TYLENOL) 500 MG TABLET    Take 1,000 mg by mouth daily.   ALBUTEROL (PROAIR HFA) 108 (90 BASE) MCG/ACT INHALER    Inhale 1-2 puffs into the lungs every 6 (six) hours as needed for wheezing or shortness of breath.   ANASTROZOLE (ARIMIDEX) 1 MG TABLET    TAKE 1 TABLET BY MOUTH EVERY DAY   ASPIRIN EC 81 MG EC TABLET    Take 1 tablet (81 mg total) by mouth daily. Swallow whole.   ATORVASTATIN (LIPITOR) 80 MG TABLET    Take 1 tablet (80 mg total) by mouth daily.   BUDESONIDE-FORMOTEROL (SYMBICORT) 160-4.5 MCG/ACT INHALER    Inhale 2 puffs into the lungs 2 (two) times daily. For bronchitis   CETIRIZINE (ZYRTEC) 10 MG TABLET    Take 1 tablet (10 mg total) by mouth daily.   CLOPIDOGREL (PLAVIX) 75 MG TABLET    Take 1 tablet (75 mg total) by mouth daily with breakfast.   CONTINUOUS BLOOD GLUC SENSOR (FREESTYLE LIBRE 14 DAY SENSOR) MISC    USE UNIT TO TEST FOUR TIMES DAILY, BEFORE MEALS AND AT BEDTIME.   CYANOCOBALAMIN (VITAMIN B-12 PO)    Take 1 tablet by mouth every evening.   DULOXETINE (CYMBALTA) 60 MG CAPSULE    TAKE 1 CAPSULE BY MOUTH EVERY DAY FOR ANXIETY   ERGOCALCIFEROL (VITAMIN D2) 1.25 MG (50000 UT) CAPSULE    Take 1 capsule (50,000 Units total) by mouth once a week.   FEBUXOSTAT (ULORIC) 40 MG TABLET    TAKE 1 TABLET BY MOUTH EVERY DAY   FLUTICASONE (FLONASE) 50 MCG/ACT NASAL SPRAY    Place 1 spray into both nostrils in the morning and at bedtime.   FUROSEMIDE (LASIX) 20 MG TABLET    TAKE 1 TABLET BY MOUTH EVERY DAY AS NEEDED   HYDRALAZINE (APRESOLINE) 50 MG TABLET    Take 1 tablet (50 mg total) by mouth 2 (two) times daily.   HYDROXYZINE (ATARAX/VISTARIL) 10 MG TABLET    Take 1 tablet by mouth three times daily as needed   INSULIN ASPART  (NOVOLOG FLEXPEN) 100 UNIT/ML FLEXPEN  Inject 16 Units into the skin 3 (three) times daily with meals. Max daily dose 70 units   INSULIN GLARGINE, 2 UNIT DIAL, (TOUJEO MAX SOLOSTAR) 300 UNIT/ML SOLOSTAR PEN    Inject 38 Units into the skin daily. Patient assistance program provides   ISOSORBIDE MONONITRATE (IMDUR) 60 MG 24 HR TABLET    TAKE 1 TABLET BY MOUTH EVERY DAY   MECLIZINE (ANTIVERT) 25 MG TABLET    TAKE 1 TABLET BY MOUTH THREE TIMES DAILY AS NEEDED FOR DIZZINESS   MEMANTINE (NAMENDA) 5 MG TABLET    TAKE 2 TABLETS BY MOUTH EVERY MORNING and TAKE 1 TABLET EVERY EVENING FOR memory   METOPROLOL SUCCINATE (TOPROL-XL) 50 MG 24 HR TABLET    Take 50 mg by mouth daily.   NITROGLYCERIN (NITROSTAT) 0.4 MG SL TABLET    Take one tablet under the tongue every 5 minutes as needed for chest pain   PANTOPRAZOLE (PROTONIX) 40 MG TABLET    TAKE 1 TABLET BY MOUTH EVERY DAY   PREGABALIN (LYRICA) 25 MG CAPSULE    Take 1 capsule (25 mg total) by mouth 2 (two) times daily.   SODIUM POLYSTYRENE (SPS) 15 GM/60ML SUSPENSION    TAKE 15 grams (60 mls) BY MOUTH ONCE WEEKLY TO keep potassium down   TOPIRAMATE (TOPAMAX) 25 MG TABLET    Take 1 tablet (25 mg total) by mouth daily.  Modified Medications   No medications on file  Discontinued Medications   LORATADINE (CLARITIN) 10 MG TABLET    Take 10 mg by mouth daily as needed for allergies.    Physical Exam:  Vitals:   02/23/20 0916  BP: 128/80  Pulse: 79  Temp: (!) 95.7 F (35.4 C)  TempSrc: Temporal  SpO2: 98%  Weight: (!) 338 lb (153.3 kg)  [name]: 5\' 7"  (1.702 m)   Body mass index is 52.94 kg/m. Wt Readings from Last 3 Encounters:  02/23/20 (!) 338 lb (153.3 kg)  02/14/20 (!) 353 lb (160.1 kg)  12/23/19 (!) 329 lb (149.2 kg)    Physical Exam Constitutional:      General: She is not in acute distress.    Appearance: She is well-developed. She is not diaphoretic.  HENT:     Head: Normocephalic and atraumatic.  Cardiovascular:     Rate and  Rhythm: Normal rate and regular rhythm.     Heart sounds: Normal heart sounds.  Pulmonary:     Effort: Pulmonary effort is normal.     Breath sounds: Normal breath sounds.  Abdominal:     General: Bowel sounds are normal.     Palpations: Abdomen is soft.  Musculoskeletal:        General: No tenderness.     Cervical back: Normal range of motion and neck supple.  Skin:    General: Skin is warm and dry.  Neurological:     Mental Status: She is alert and oriented to person, place, and time. Mental status is at baseline.  Psychiatric:        Mood and Affect: Mood normal.        Behavior: Behavior normal.     Labs reviewed: Basic Metabolic Panel: Recent Labs    02/14/20 1624 02/17/20 1151 02/18/20 0748  NA 141 137 137  K 4.5 4.7 4.2  CL 106 105 107  CO2 25 23 22   GLUCOSE 166* 212* 161*  BUN 31* 33* 29*  CREATININE 1.48* 1.59* 1.44*  CALCIUM 9.0 8.8* 8.5*   Liver Function Tests: Recent Labs  07/24/19 1438 12/23/19 1021  AST 9* 9*  ALT 6 7  ALKPHOS 92  --   BILITOT 0.3 0.3  PROT 7.6 6.9  ALBUMIN 2.8*  --    No results for input(s): LIPASE, AMYLASE in the last 8760 hours. No results for input(s): AMMONIA in the last 8760 hours. CBC: Recent Labs    07/24/19 1438 07/24/19 1438 12/23/19 1021 02/14/20 1624 02/16/20 0128 02/16/20 0128 02/16/20 2116 02/17/20 0125 02/18/20 0748  WBC 12.1*   < > 14.1*   < > 14.9*  --   --  12.1* 12.5*  NEUTROABS 8.7*  --  9,828*  --   --   --   --   --   --   HGB 9.4*   < > 9.0*   < > 7.7*   < > 9.3* 9.0* 8.6*  HCT 31.2*   < > 28.9*   < > 26.1*   < > 30.2* 29.6* 29.0*  MCV 89.9   < > 87.8   < > 90.3  --   --  89.2 90.6  PLT 278   < > 279   < > 277  --   --  268 257   < > = values in this interval not displayed.   Lipid Panel: Recent Labs    12/23/19 1021 02/15/20 0636  CHOL 117 117  HDL 39* 40*  LDLCALC 58 60  TRIG 114 85  CHOLHDL 3.0 2.9   TSH: No results for input(s): TSH in the last 8760 hours. A1C: Lab Results   Component Value Date   HGBA1C 8.9 (A) 12/23/2019     Assessment/Plan 1. Non-STEMI (non-ST elevated myocardial infarction) ([place]) -currently chest pain free, discharged with aspirin and Plavix, statin, metoprolol, Imdur to continue at this time.  -follow up up with cardiology -educated about dietary modifications.  2. Chronic diastolic congestive heart failure ([place]) -appears stable at this time. No increase in edema, shortness of breath, chest pains, no abnormal breath sounds noted today. She does have a PRN lasix if needed but has not used. Educated on low sodium diet.   3. Coronary artery disease due to lipid rich plaque recent NSTEMIS; table on imdur, metoprolol, asa and plavix   4. Essential hypertension Blood pressure controlled. Continue current regimen, encouraged dietary compliance.  5. Type 2 diabetes mellitus with diabetic neuropathy, with long-term current use of insulin (HCC) -neuropathy stable at this time. Continues on lyrica and cymbalta.  6. Type 2 diabetes mellitus with hyperglycemia, with long-term current use of insulin ([place]) Ongoing, uncontrolled, followed by endocrine. Encouraged dietary compliance, routine foot care/monitoring and to keep up with diabetic eye exams through ophthalmology   7. Hyperkalemia Due to CKD, managed my nephrology.  8. Memory loss Ongoing but remains stable at this time. Daughter and sister help with her medications and appts.   9. Malignant neoplasm of central portion of left female breast, unspecified estrogen receptor status ([place]) Ongoing follow up with oncology, has appt today. Continues on arimidex 1 mg daily  10. Advanced directives, counseling/discussion Spent time today reviewing MOST from and completing. Discussed goals of care with pt and discussed that all be on the same page in regards to her healthcare. She is not wanting to be aggressive with her diabetes or dietary modifications and aware of risk of progressive disease  with complications and death.  - DNR (Do Not Resuscitate)  Next appt: 1 month for routine follow up.  Carlos American. Harle Battiest  [employer]  [facility] [phone]

## 2020-02-23 NOTE — Telephone Encounter (Signed)
Pharmacy requested refill. Stated that they pack her medications.  Pended Rx and sent to [facility] for approval.

## 2020-02-23 NOTE — Progress Notes (Signed)
.    Hematology oncology Clinic Followup Note  Date of service 02/23/20   Patient Care Team: [name], NP as PCP - General (Geriatric Medicine) [name], MD as PCP - Cardiology (Cardiology) [name], [name], MD (Inactive) as Consulting Physician (Cardiology) [name], MD as Consulting Physician (Ophthalmology) [name], MD as Consulting Physician (General Surgery) [name], MD (Inactive) as Referring Physician (Obstetrics and Gynecology) [name] [name], MD as Consulting Physician (Ophthalmology) [facility], [name], MD as Consulting Physician (Endocrinology) [name], MD as Consulting Physician (Hematology) [name], DO as Consulting Physician (Neurology)  CHIEF COMPLAINTS/PURPOSE OF CONSULTATION:  F/u for breast cancer.  DIAGNOSIS: left-sided presumed stage IA  (pT1c,pNx[cN0], cM0) invasive ductal carcinoma grade 2 out of 3, strongly ER +100%, strongly PR +100% and HER-2/neu negative. Given her high risk cardiac status lumpectomy had to be done under local anesthesia and sentinel lymph node biopsy was not possible. She has accompanying DCIS of intermediate grade present at margins. ECOG performance status is 3. -Had a mammogram/tomosynthesis done on 07/29/2015 which shows residual suspicious calcification in the left breast postero-medial to the biopsy site. Has known residual DCIS.  No evidence of new malignancy.  No evidence of right breast malignancy.   CURRENT TREATMENT: Arimidex 47m po daily  HISTORY OF PRESENTING ILLNESS: -please see my initial consultation for details of initial presentation   INTERVAL HISTORY: [name] here for management and evaluation of her breast cancer. We are joined today by [name] [name] last visit with [place] on 07/24/2019. The pt reports that she is doing well overall.  The pt reports that she had some chest pain after her recent hospitalization, but denies any in the last week.  Pt notes significant improvement in her coughing, weakness, and shortness of breath. She continues Plavix and Aspirin. Pt has also continued Arimidex and denies any issues with the medication. She is taking PO Vitamin B12 and weekly Ergocalciferol.  Within the last month pt began to experience pain across her shoulders. Pt also had foot pain that appears to have traveled up her knees and into her hips. Her right knee is still very tender to the touch.   Pt has has her COVID19 vaccines and booster. She has also had the flu vaccine. Pt saw her PCP today and is being referred to the pain management clinic.  Lab results today (02/23/20) of CBC w/diff and CMP is as follows: all values are WNL except for WBC at 15.8K, RBC at 3.53, Hgb at 9.5, HCT at 31.7, Neutro Abs at 11.6K, Abs Immature Granulocytes at 0.10K, Glucose at 112, BUN at 29, Creatinine at 1.64, Albumin at 2.9, AST at 10, Total Bilirubin at <0.2, GFR Est at 29. 02/23/2020 Vitamin B12 at 677 02/23/2020 Ferritin at 49  On review of systems, pt reports shoulder pain, knee pain, hip pain, leg soreness and denies bloody/black stools, chest pain, coughing, weakness, SOB, breast pain, new lumps/bumps, abdominal pain, leg swelling and any other symptoms.   MEDICAL HISTORY:  Past Medical History:  Diagnosis Date  . Allergy    takes Mucinex daily as needed  . Anemia, unspecified   . Anxiety    takes Clonazepam daily as needed  . Arthritis   . Cancer (HCC)    breast  . [place]ive heart failure[place]    takes Furosemide daily  . CKD (chronic kidney disease)   . Coronary artery disease    a. s/p IMI 2004 tx with  BMS to RCA;  b. s/p Promus DES to RCA 2/12 c. 06/2014 High risk stress test follow up cath 06/2014 showd patent stent--< med therapy for other mild to moedrate disease   . Coronary atherosclerosis of native coronary artery   . Depression    takes Cymbalta daily  . Diabetes mellitus    insulin daily  . Dysuria   . GERD  (gastroesophageal reflux disease)    takes Protonix daily  . Gout, unspecified    takes Colchicine daily  . Headache    occasionally  . History of blood clots about 49yr ago   in legs-takes Coumadin   . Hyperlipidemia    takes Pravastatin daily  . Hypertension    takes Imdur and Metoprolol daily  . Insomnia   . Joint pain   . Joint swelling   . Myocardial infarction ([place] 2004  . Numbness   . Obstructive sleep apnea    does not wear cpap  . Osteoarthritis   . Osteoarthritis   . Osteoarthrosis, unspecified whether generalized or localized, lower leg   . Pain, chronic   . Polymyalgia rheumatica ([place]   . Pulmonary emboli ([place] 9/13   felt to need lifelong anticoagulation  . Shortness of breath dyspnea    with exertion and has Albuterol inhaler prn  . Type II or unspecified type diabetes mellitus without mention of complication, uncontrolled   . Urinary incontinence    takes Linzess daily  . Vertigo    hx of;was taking Meclizine if needed  . Wears dentures     SURGICAL HISTORY: Past Surgical History:  Procedure Laterality Date  . ABDOMINAL HYSTERECTOMY     partial  . APPENDECTOMY    . blood clots/legs and lungs  2013  . BREAST BIOPSY Left 07/22/2014  . BREAST BIOPSY Left 02/10/2013  . BREAST LUMPECTOMY Left 11/05/2014  . BREAST LUMPECTOMY WITH RADIOACTIVE SEED LOCALIZATION Left 11/05/2014   Procedure: LEFT BREAST LUMPECTOMY WITH RADIOACTIVE SEED LOCALIZATION;  Surgeon: DCoralie Keens MD;  [place]  Service: General;  Laterality: Left;  . CARDIAC CATHETERIZATION    . COLONOSCOPY    . CORONARY ANGIOPLASTY  2  . CORONARY STENT INTERVENTION N/A 02/17/2020   Procedure: CORONARY STENT INTERVENTION;  Surgeon: HLeonie Man MD;  [facility];  Service: Cardiovascular;  Laterality: N/A;  . ESOPHAGOGASTRODUODENOSCOPY (EGD) WITH PROPOFOL N/A 11/07/2016   Procedure: ESOPHAGOGASTRODUODENOSCOPY (EGD) WITH PROPOFOL;  Surgeon: GGatha Mayer MD;   [facility];  Service: Endoscopy;  Laterality: N/A;  . EXCISION OF SKIN TAG Right 11/05/2014   Procedure: EXCISION OF RIGHT EYELID SKIN TAG;  Surgeon: DCoralie Keens MD;  [place]  Service: General;  Laterality: Right;  . EYE SURGERY Bilateral    cataract   . GASTRIC BYPASS  1977    reversed in 1979, [place] AND CORONARY ANGIOGRAPHY N/A 02/17/2020   Procedure: LEFT HEART CATH AND CORONARY ANGIOGRAPHY;  Surgeon: HLeonie Man MD;  [place] [facility];  Service: Cardiovascular;  Laterality: N/A;  . LEFT HEART CATHETERIZATION WITH CORONARY ANGIOGRAM N/A 06/29/2014   Procedure: LEFT HEART CATHETERIZATION WITH CORONARY ANGIOGRAM;  Surgeon: TTroy Sine MD;  [facility];  Service: Cardiovascular;  Laterality: N/A;  . MEMBRANE PEEL Right 10/23/2018   Procedure: MEMBRANE PEEL;  Surgeon: RHurman Horn MD;  [place]  Service: Ophthalmology;  Laterality: Right;  . MI with stent placement  2004  . PARS  PLANA VITRECTOMY Right 10/23/2018   Procedure: PARS PLANA VITRECTOMY WITH 25 GAUGE;  Surgeon: Hurman Horn, MD;  Location: [place];  Service: Ophthalmology;  Laterality: Right;    SOCIAL HISTORY: Social History   Socioeconomic History  . Marital status: Widowed    Spouse name: Not on file  . Number of children: Not on file  . Years of education: Not on file  . Highest education level: Not on file  Occupational History  . Occupation: retired  Tobacco Use  . Smoking status: Never Smoker  . Smokeless tobacco: Never Used  Vaping Use  . Vaping Use: Never used  Substance and Sexual Activity  . Alcohol use: No    Alcohol/week: 0.0 standard drinks  . Drug use: No  . Sexual activity: Not Currently  Other Topics Concern  . Not on file  Social History Narrative  . Not on file   Social Determinants of Health   Financial Resource Strain:   . Difficulty of Paying Living Expenses: Not on file  Food Insecurity:   .  Worried About Charity fundraiser in the Last Year: Not on file  . Ran Out of Food in the Last Year: Not on file  Transportation Needs:   . Lack of Transportation (Medical): Not on file  . Lack of Transportation (Non-Medical): Not on file  Physical Activity:   . Days of Exercise per Week: Not on file  . Minutes of Exercise per Session: Not on file  Stress:   . Feeling of Stress : Not on file  Social Connections:   . Frequency of Communication with Friends and Family: Not on file  . Frequency of Social Gatherings with Friends and Family: Not on file  . Attends Religious Services: Not on file  . Active Member of Clubs or Organizations: Not on file  . Attends Archivist Meetings: Not on file  . Marital Status: Not on file  Intimate Partner Violence:   . Fear of Current or Ex-Partner: Not on file  . Emotionally Abused: Not on file  . Physically Abused: Not on file  . Sexually Abused: Not on file    FAMILY HISTORY: Family History  Problem Relation Age of Onset  . Breast cancer Mother 5  . Heart disease Mother   . Throat cancer Father   . Hypertension Father   . Arthritis Father   . Diabetes Father   . Arthritis Sister   . Obesity Sister   . Diabetes Sister   . Heart disease Cousin   . Colon cancer Neg Hx   . Stomach cancer Neg Hx   . Esophageal cancer Neg Hx     ALLERGIES:  is allergic to sulfonamide derivatives, aricept [donepezil hcl], and tramadol.  MEDICATIONS:  Current Outpatient Medications  Medication Sig Dispense Refill  . acetaminophen (TYLENOL) 500 MG tablet Take 1,000 mg by mouth daily.    [name] albuterol (PROAIR HFA) 108 (90 Base) MCG/ACT inhaler Inhale 1-2 puffs into the lungs every 6 (six) hours as needed for wheezing or shortness of breath. 6.7 g 1  . anastrozole (ARIMIDEX) 1 MG tablet TAKE 1 TABLET BY MOUTH EVERY DAY 90 tablet 1  . aspirin EC 81 MG EC tablet Take 1 tablet (81 mg total) by mouth daily. Swallow whole. 30 tablet 1  . atorvastatin  (LIPITOR) 80 MG tablet Take 1 tablet (80 mg total) by mouth daily. 30 tablet 1  . budesonide-formoterol (SYMBICORT) 160-4.5 MCG/ACT inhaler Inhale 2 puffs into the lungs  2 (two) times daily. For bronchitis 1 Inhaler 3  . cetirizine (ZYRTEC) 10 MG tablet Take 1 tablet (10 mg total) by mouth daily. 30 tablet 11  . clopidogrel (PLAVIX) 75 MG tablet Take 1 tablet (75 mg total) by mouth daily with breakfast. 30 tablet 1  . Continuous Blood Gluc Sensor (FREESTYLE LIBRE 14 DAY SENSOR) MISC USE UNIT TO TEST FOUR TIMES DAILY, BEFORE MEALS AND AT BEDTIME. 6 each 0  . Cyanocobalamin (VITAMIN B-12 PO) Take 1 tablet by mouth every evening.    . DULoxetine (CYMBALTA) 60 MG capsule TAKE 1 CAPSULE BY MOUTH EVERY DAY FOR ANXIETY 90 capsule 1  . ergocalciferol (VITAMIN D2) 1.25 MG (50000 UT) capsule Take 1 capsule (50,000 Units total) by mouth once a week. 12 capsule 3  . febuxostat (ULORIC) 40 MG tablet TAKE 1 TABLET BY MOUTH EVERY DAY 90 tablet 1  . fluticasone (FLONASE) 50 MCG/ACT nasal spray Place 1 spray into both nostrils in the morning and at bedtime. 11.1 mL 3  . furosemide (LASIX) 20 MG tablet TAKE 1 TABLET BY MOUTH EVERY DAY AS NEEDED 30 tablet 6  . hydrALAZINE (APRESOLINE) 50 MG tablet Take 1 tablet (50 mg total) by mouth 2 (two) times daily. 60 tablet 3  . hydrOXYzine (ATARAX/VISTARIL) 10 MG tablet Take 1 tablet by mouth three times daily as needed 90 tablet 1  . insulin aspart (NOVOLOG FLEXPEN) 100 UNIT/ML FlexPen Inject 16 Units into the skin 3 (three) times daily with meals. Max daily dose 70 units 63 mL 3  . insulin glargine, 2 Unit Dial, (TOUJEO MAX SOLOSTAR) 300 UNIT/ML Solostar Pen Inject 38 Units into the skin daily. Patient assistance program provides 15 mL 6  . isosorbide mononitrate (IMDUR) 60 MG 24 hr tablet TAKE 1 TABLET BY MOUTH EVERY DAY 90 tablet 1  . meclizine (ANTIVERT) 25 MG tablet TAKE 1 TABLET BY MOUTH THREE TIMES DAILY AS NEEDED FOR DIZZINESS 90 tablet 1  . memantine (NAMENDA) 5 MG  tablet TAKE 2 TABLETS BY MOUTH EVERY MORNING and TAKE 1 TABLET EVERY EVENING FOR memory 90 tablet 5  . metoprolol succinate (TOPROL-XL) 50 MG 24 hr tablet Take 50 mg by mouth daily.    . nitroGLYCERIN (NITROSTAT) 0.4 MG SL tablet Take one tablet under the tongue every 5 minutes as needed for chest pain 50 tablet 0  . pantoprazole (PROTONIX) 40 MG tablet TAKE 1 TABLET BY MOUTH EVERY DAY 90 tablet 1  . pregabalin (LYRICA) 25 MG capsule Take 1 capsule (25 mg total) by mouth 2 (two) times daily. 60 capsule 3  . sodium polystyrene (SPS) 15 GM/60ML suspension TAKE 15 grams (60 mls) BY MOUTH ONCE WEEKLY TO keep potassium down 473 mL 2  . topiramate (TOPAMAX) 25 MG tablet Take 1 tablet (25 mg total) by mouth daily. 90 tablet 1   No current facility-administered medications for this visit.    REVIEW OF SYSTEMS:   A 10+ POINT REVIEW OF SYSTEMS WAS OBTAINED including neurology, dermatology, psychiatry, cardiac, respiratory, lymph, extremities, GI, GU, Musculoskeletal, constitutional, breasts, reproductive, HEENT.  All pertinent positives are noted in the HPI.  All others are negative.   PHYSICAL EXAMINATION:  ECOG PERFORMANCE STATUS: 3 - Symptomatic, >50% confined to bed  Vitals:   02/23/20 1445  BP: (!) 168/73  Pulse: 83  Resp: 18  Temp: 98.1 F (36.7 C)  SpO2: 100%   Filed Weights   02/23/20 1445  Weight: (!) 338 lb 1.6 oz (153.4 kg)    Exam  was given in a wheelchair  GENERAL:alert, in no acute distress and comfortable SKIN: no acute rashes, no significant lesions EYES: conjunctiva are pink and non-injected, sclera anicteric OROPHARYNX: MMM, no exudates, no oropharyngeal erythema or ulceration NECK: supple, no JVD LYMPH:  no palpable lymphadenopathy in the cervical, axillary or inguinal regions LUNGS: clear to auscultation b/l with normal respiratory effort HEART: regular rate & rhythm ABDOMEN:  normoactive bowel sounds , non tender, not distended. No palpable hepatosplenomegaly.   Extremity: no pedal edema, extreme tenderness to the right knee PSYCH: alert & oriented x 3 with fluent speech NEURO: no focal motor/sensory deficits  LABORATORY DATA:  I have reviewed the data as listed  . CBC Latest Ref Rng & Units 02/23/2020 02/18/2020 02/17/2020  WBC 4.0 - 10.5 K/uL 15.8(H) 12.5(H) 12.1(H)  Hemoglobin 12.0 - 15.0 g/dL 9.5(L) 8.6(L) 9.0(L)  Hematocrit 36 - 46 % 31.7(L) 29.0(L) 29.6(L)  Platelets 150 - 400 K/uL 278 257 268   . CBC    Component Value Date/Time   WBC 15.8 (H) 02/23/2020 1408   RBC 3.53 (L) 02/23/2020 1408   HGB 9.5 (L) 02/23/2020 1408   HGB 8.5 (L) 10/23/2017 1418   HGB 8.7 (L) 03/21/2017 1351   HCT 31.7 (L) 02/23/2020 1408   HCT 28.9 (L) 03/21/2017 1351   PLT 278 02/23/2020 1408   PLT 317 10/23/2017 1418   PLT 295 03/21/2017 1351   MCV 89.8 02/23/2020 1408   MCV 84.3 03/21/2017 1351   MCH 26.9 02/23/2020 1408   MCHC 30.0 02/23/2020 1408   RDW 14.3 02/23/2020 1408   RDW 15.5 (H) 03/21/2017 1351   LYMPHSABS 3.1 02/23/2020 1408   LYMPHSABS 2.4 03/21/2017 1351   MONOABS 0.7 02/23/2020 1408   MONOABS 0.7 03/21/2017 1351   EOSABS 0.3 02/23/2020 1408   EOSABS 0.2 03/21/2017 1351   BASOSABS 0.1 02/23/2020 1408   BASOSABS 0.0 03/21/2017 1351   . CMP Latest Ref Rng & Units 02/23/2020 02/18/2020 02/17/2020  Glucose 70 - 99 mg/dL 112(H) 161(H) 212(H)  BUN 8 - 23 mg/dL 29(H) 29(H) 33(H)  Creatinine 0.44 - 1.00 mg/dL 1.64(H) 1.44(H) 1.59(H)  Sodium 135 - 145 mmol/L 142 137 137  Potassium 3.5 - 5.1 mmol/L 4.7 4.2 4.7  Chloride 98 - 111 mmol/L 109 107 105  CO2 22 - 32 mmol/L _0 Calcium 8.9 - 10.3 mg/dL 9.2 8.5(L) 8.8(L)  Total Protein 6.5 - 8.1 g/dL 7.9 - -  Total Bilirubin 0.3 - 1.2 mg/dL <0.2(L) - -  Alkaline Phos 38 - 126 U/L 84 - -  AST 15 - 41 U/L 10(L) - -  ALT 0 - 44 U/L 6 - -        RADIOGRAPHIC STUDIES: I have personally reviewed the radiological images as listed and agreed with the findings in the report.  DG  Chest 1 View  Result Date: 02/17/2020 CLINICAL DATA:  Chest pain EXAM: CHEST  1 VIEW COMPARISON:  02/14/2020 FINDINGS: Trachea midline. Cardiomediastinal contours with mild cardiac enlargement. Mild elevation of the RIGHT hemidiaphragm and low lung volumes without lobar consolidation or sign of pleural effusion on frontal view. On limited assessment no acute skeletal process. IMPRESSION: Mild cardiac enlargement. No acute cardiopulmonary disease. Electronically Signed   By: Zetta Bills M.D.   On: 02/17/2020 10:58   DG Chest 2 View  Result Date: 02/14/2020 CLINICAL DATA:  Chest pain EXAM: CHEST - 2 VIEW COMPARISON:  Sep 08, 2019 FINDINGS: The heart size and mediastinal contours are within  normal limits. Both lungs are clear. There is chronic elevation of right hemidiaphragm. The visualized skeletal structures are unremarkable. IMPRESSION: No active cardiopulmonary disease. Electronically Signed   By: Abelardo Diesel M.D.   On: 02/14/2020 16:54   DG Cervical Spine Complete  Result Date: 02/03/2020 CLINICAL DATA:  Left-sided neck and shoulder pain for 1 year, no known injury, initial encounter EXAM: CERVICAL SPINE - COMPLETE 4+ VIEW COMPARISON:  None. FINDINGS: Seven cervical segments are visualized. No compression deformity is seen. Mild osteophytic changes are noted. Multilevel facet hypertrophic changes are seen. No prevertebral soft tissue abnormality is noted. Carotid calcifications are seen. The neural foramina are patent. No other focal abnormality is noted. IMPRESSION: Multilevel degenerative change without acute abnormality. Electronically Signed   By: Inez Catalina M.D.   On: 02/03/2020 08:08   CARDIAC CATHETERIZATION  Result Date: 89/38/1017  LV end diastolic pressure is mildly elevated.  ------------------------------  Mid LM to Prox LAD lesion is 30% stenosed with 30% stenosed side branch in Ost Cx.  Prox LAD to Mid LAD lesion is 45% stenosed with 25% stenosed side branch in 1st Diag.   1st Mrg lesion is 100% stenosed.  ----------------  Colon Flattery RCA to Prox RCA stent is 10% stenosed.  Prox RCA to Mid RCA stent is 5% stenosed.  Prox RCA lesion is 95% stenosed, between the 2 old stents  A drug-eluting stent was successfully placed using a STENT RESOLUTE ONYX 4.0X18 -> postdilated to 4.6-4.7 mm  Post intervention, there is a 0% residual stenosis.  ----------------  Mid RCA lesion is 85% stenosed after the second stent  A drug-eluting stent was successfully placed using a STENT RESOLUTE ONYX 3.0X18 -> tapered post dilation from 4.2-3.3 mm  Post intervention, there is a 0% residual stenosis.  SUMMARY  Multivessel CAD with severe (95%) IntraStent stenosis in the proximal RCA and de novo stenosis distal to mid RCA stent (85%) -> both treated with DES PCI using Resolute Onyx DES stents (distal-3.0 mm x 18 mm with tapered post dilation 4.2-3.3 mm; proximal 4.0 mm 18 mm postdilated to 4.7 mm.)  Diffuse mild to moderate calcified disease in the distal Left Main as well as ostial and proximal LCx and LAD with no severe stenoses.  Borderline elevated LVEDP with severe systemic hypertension RECOMMENDATIONS  Return to nursing unit for ongoing care.  Okay to DC heparin.  We will need to determine plus or minus the use of Xarelto plus Plavix.  For now would continue aspirin plus Plavix until discharge and then discontinue aspirin on discharge with Xarelto plus Plavix for 3-6 months.  Continue to monitor anemia and other risk factors. Discussed with Dr. Angelena Form. Glenetta Hew, MD  ECHOCARDIOGRAM COMPLETE  Result Date: 02/15/2020    ECHOCARDIOGRAM REPORT   Patient Name:   THERISA MENNELLA Date of Exam: 02/15/2020 Medical Rec #:  [id number]       Height:       67.0 in Accession #:    [id number]      Weight:       353.0 lb Date of Birth:  02-01-1935       BSA:          2.575 m Patient Age:    6 years        BP:           89/50 mmHg Patient Gender: F               HR:  77 bpm. Exam Location:   Inpatient Procedure: 2D Echo and Intracardiac Opacification Agent Indications:    Abnormal ECG 794.31 / R94.31  History:        Patient has prior history of Echocardiogram examinations, most                 recent 10/09/2017. CHF, CAD and Previous Myocardial Infarction;                 Risk Factors:Diabetes, Hypertension and Dyslipidemia. Chronic                 kidney disease, GERD, breast cancer.  Sonographer:    Darlina Sicilian RDCS Referring Phys: [id number] [place] Comments: No parasternal window and no subcostal window. Image acquisition challenging due to patient body habitus. IMPRESSIONS  1. Technically difficult echo with poor image quality.  2. Left ventricular ejection fraction, by estimation, is 65 to 70%. The left ventricle has normal function. The left ventricle has no regional wall motion abnormalities. Left ventricular diastolic parameters are consistent with Grade I diastolic dysfunction (impaired relaxation).  3. Right ventricular systolic function was not well visualized. The right ventricular size is not well visualized.  4. The mitral valve was not well visualized. No evidence of mitral valve regurgitation.  5. The aortic valve was not well visualized. Aortic valve regurgitation is not visualized. FINDINGS  Left Ventricle: Left ventricular ejection fraction, by estimation, is 65 to 70%. The left ventricle has normal function. The left ventricle has no regional wall motion abnormalities. Definity contrast agent was given IV to delineate the left ventricular  endocardial borders. The left ventricular internal cavity size was normal in size. There is no left ventricular hypertrophy. Left ventricular diastolic parameters are consistent with Grade I diastolic dysfunction (impaired relaxation). Right Ventricle: The right ventricular size is not well visualized. Right vetricular wall thickness was not well visualized. Right ventricular systolic function was not well visualized. Left  Atrium: Left atrial size was normal in size. Right Atrium: Right atrial size was normal in size. Pericardium: There is no evidence of pericardial effusion. Mitral Valve: The mitral valve was not well visualized. No evidence of mitral valve regurgitation. Tricuspid Valve: The tricuspid valve is not well visualized. Tricuspid valve regurgitation is not demonstrated. Aortic Valve: The aortic valve was not well visualized. Aortic valve regurgitation is not visualized. Pulmonic Valve: The pulmonic valve was not well visualized. Pulmonic valve regurgitation is not visualized. Aorta: The aortic root was not well visualized. IAS/Shunts: The atrial septum is grossly normal. Additional Comments: Technically difficult echo with poor image quality.   Diastology LV e' medial:    3.70 cm/s LV E/e' medial:  19.7 LV e' lateral:   6.53 cm/s LV E/e' lateral: 11.1  LEFT ATRIUM             Index LA Vol (A2C):   24.4 ml 9.48 ml/m LA Vol (A4C):   35.7 ml 13.87 ml/m LA Biplane Vol: 30.3 ml 11.77 ml/m  AORTIC VALVE LVOT Vmax:   93.70 cm/s LVOT Vmean:  73.200 cm/s LVOT VTI:    0.158 m MITRAL VALVE MV Area (PHT): 3.60 cm     SHUNTS MV Decel Time: 211 msec     Systemic VTI: 0.16 m MV E velocity: 72.80 cm/s MV A velocity: 119.00 cm/s MV E/A ratio:  0.61 Mertie Moores MD Electronically signed by Mertie Moores MD Signature Date/Time: 02/15/2020/1:23:56 PM    Final    Diagnostic mammogram b/l 11/01/17  IMPRESSION: No significant change in suspicious microcalcifications posterior to the lumpectomy site in the left breast compared to the mammogram of 2018. No suspicious findings in the right breast. Exclusion of some posterior tissue due to patient decreased mobility.  RECOMMENDATION: Diagnostic mammogram is suggested in 1 year. (Code:[id number])  CLINICAL DATA:  Annual mammography. The patient had surgery for DCIS in the left breast in 2016. There were positive margins at surgery but the patient could not return for additional  surgery. Known suspicious calcifications remain. EXAM: 2D DIGITAL DIAGNOSTIC BILATERAL MAMMOGRAM WITH CAD AND ADJUNCT TOMO COMPARISON:  Previous exam(s). ACR Breast Density Category b: There are scattered areas of fibroglandular density. FINDINGS: The suspicious calcifications posterior to the left lumpectomy site are stable. No other interval changes or other suspicious findings. Mammographic images were processed with CAD. IMPRESSION: Continued suspicious calcifications posterior to the left lumpectomy site. No other changes. RECOMMENDATION: Continued annual mammography for surveillance. Continued oncologic follow up. I have discussed the findings and recommendations with the patient. Results were also provided in writing at the conclusion of the visit. If applicable, a reminder letter will be sent to the patient regarding the next appointment. BI-RADS CATEGORY  6: Known biopsy-proven malignancy.   Electronically Signed   By: Dorise Bullion III M.D   On: 08/16/2016 15:21   ASSESSMENT & PLAN:   Mrs. Krogh is a very wonderful 85 y.o. African-American female with multiple medical comorbidities as described above with   #1 Left-sided presumed stage IA  (pT1c,pNx[cN0], cM0) invasive ductal carcinoma grade 2 out of 3, strongly ER +100%, strongly PR +100% and HER-2/neu negative. Given her high risk cardiac status lumpectomy had to be done under local anesthesia and sentinel lymph node biopsy was not possible. She has accompanying DCIS of intermediate grade present at margins. ECOG performance status is 3. Dexa scan "low bone mass" per WHO. -Bone density scan on 03/12/2017 with results showing: T-score of 0.5 at left forearm. Lumbar spine and dual femurs not completed due to patient in a wheelchair and not able to lay flat due to SOB while laying flat.   11/01/17 Mammogram stable.   #2 significant coronary artery disease with stress test in February 2016 suggesting likely  multivessel disease. Has uncontrolled diabetes, hypertension, dyslipidemia, untreated sleep apnea, coronary disease, severe arthritis all of which are significantly limiting her quality of life.  #3 vitamin D deficiency - 25OH vitamin D level of 10, s/p high dose ergocalciferol re-placement with 25OH Vit D improvement to 30.9 in 10/2017. Now on vit D 2000 units daily.   #4 Worsening Anemia ? Slow GI losses . Multiple metabolic insults in the bone marrow including uncontrolled diabetes .Patient notes that her fasting glucose and so and in the 300s   #5 Hyperkalemia K 5.5 -- in setting of CKD and ? RTA type IV from DM2  PLAN: -Discussed pt labwork today, 02/23/20; anemia is improving, WBC & PLT are okay, kidney function is stable, B12 is okay, Ferritin is low. -No lab or clinical evidence of Breast Cancer progression at this time - will f/u on results from 10/25  Mammogram.  -Advised pt that we would hold Xarelto at this time to minimize bleeding risk. Continue Aspirin & Plavix.  -Advised pt that goal Ferritin is higher than normal due to CKD.  -Recommend pt f/u with a mental health counselor. -Recommend pt f/u with PCP for knee pain evaluation. -Recommend pt f/u with pain management as scheduled. -Continue 1 mg Arimidex daily. No prohibitive toxicities at  this time.  -Continue Vitamin B12 and Ergocalciferol.  -Will see back in 6 months with labs   FOLLOW UP: RTC with Dr Irene Limbo with labs in 6 months   The total time spent in the appt was 20 minutes and more than 50% was on counseling and direct patient cares.  All of the patient's questions were answered with apparent satisfaction. The patient knows to call the clinic with any problems, questions or concerns.   Sullivan Lone MD [place] [facility] Physician [facility]  (Office):       [phone] (Work cell):  [phone] (Fax):           [phone]  I, Yevette Edwards, am acting as a scribe for Dr. Sullivan Lone.    .I have reviewed the above documentation for accuracy and completeness, and I agree with the above. [name] MD   ADDENDUM  MMG 03/01/20:  IMPRESSION: Similar distribution of calcifications in the LEFT breast. Calcifications are significantly more faint compared with earlier exams. No new findings.  RECOMMENDATION: Recommend bilateral diagnostic mammogram in 1 year.  I have discussed the findings and recommendations with the patient and her daughter. If applicable, a reminder letter will be sent to the patient regarding the next appointment.  BI-RADS CATEGORY  2: Benign.   Electronically Signed   By: Nolon Nations M.D.   On: 03/01/2020 11:18

## 2020-02-24 ENCOUNTER — Other Ambulatory Visit: Payer: Self-pay | Admitting: Nurse Practitioner

## 2020-02-24 ENCOUNTER — Encounter: Payer: Self-pay | Admitting: Nurse Practitioner

## 2020-02-24 DIAGNOSIS — E785 Hyperlipidemia, unspecified: Secondary | ICD-10-CM | POA: Diagnosis not present

## 2020-02-24 DIAGNOSIS — N1832 Chronic kidney disease, stage 3b: Secondary | ICD-10-CM | POA: Diagnosis not present

## 2020-02-24 DIAGNOSIS — I214 Non-ST elevation (NSTEMI) myocardial infarction: Secondary | ICD-10-CM | POA: Diagnosis not present

## 2020-02-24 DIAGNOSIS — I509 Heart failure, unspecified: Secondary | ICD-10-CM | POA: Diagnosis not present

## 2020-02-24 DIAGNOSIS — I2510 Atherosclerotic heart disease of native coronary artery without angina pectoris: Secondary | ICD-10-CM | POA: Diagnosis not present

## 2020-02-24 DIAGNOSIS — F0390 Unspecified dementia without behavioral disturbance: Secondary | ICD-10-CM | POA: Diagnosis not present

## 2020-02-24 DIAGNOSIS — D631 Anemia in chronic kidney disease: Secondary | ICD-10-CM | POA: Diagnosis not present

## 2020-02-24 DIAGNOSIS — I2584 Coronary atherosclerosis due to calcified coronary lesion: Secondary | ICD-10-CM | POA: Diagnosis not present

## 2020-02-24 DIAGNOSIS — C50919 Malignant neoplasm of unspecified site of unspecified female breast: Secondary | ICD-10-CM | POA: Diagnosis not present

## 2020-02-24 DIAGNOSIS — M170 Bilateral primary osteoarthritis of knee: Secondary | ICD-10-CM | POA: Diagnosis not present

## 2020-02-24 DIAGNOSIS — D630 Anemia in neoplastic disease: Secondary | ICD-10-CM | POA: Diagnosis not present

## 2020-02-24 DIAGNOSIS — Z48812 Encounter for surgical aftercare following surgery on the circulatory system: Secondary | ICD-10-CM | POA: Diagnosis not present

## 2020-02-24 DIAGNOSIS — J449 Chronic obstructive pulmonary disease, unspecified: Secondary | ICD-10-CM | POA: Diagnosis not present

## 2020-02-24 DIAGNOSIS — M1030 Gout due to renal impairment, unspecified site: Secondary | ICD-10-CM | POA: Diagnosis not present

## 2020-02-24 DIAGNOSIS — I4891 Unspecified atrial fibrillation: Secondary | ICD-10-CM | POA: Diagnosis not present

## 2020-02-24 DIAGNOSIS — D509 Iron deficiency anemia, unspecified: Secondary | ICD-10-CM | POA: Diagnosis not present

## 2020-02-24 DIAGNOSIS — M47812 Spondylosis without myelopathy or radiculopathy, cervical region: Secondary | ICD-10-CM | POA: Diagnosis not present

## 2020-02-24 DIAGNOSIS — Z1231 Encounter for screening mammogram for malignant neoplasm of breast: Secondary | ICD-10-CM

## 2020-02-24 DIAGNOSIS — E1122 Type 2 diabetes mellitus with diabetic chronic kidney disease: Secondary | ICD-10-CM | POA: Diagnosis not present

## 2020-02-24 DIAGNOSIS — F32A Depression, unspecified: Secondary | ICD-10-CM | POA: Diagnosis not present

## 2020-02-24 DIAGNOSIS — I130 Hypertensive heart and chronic kidney disease with heart failure and stage 1 through stage 4 chronic kidney disease, or unspecified chronic kidney disease: Secondary | ICD-10-CM | POA: Diagnosis not present

## 2020-02-24 DIAGNOSIS — F419 Anxiety disorder, unspecified: Secondary | ICD-10-CM | POA: Diagnosis not present

## 2020-02-24 DIAGNOSIS — G4700 Insomnia, unspecified: Secondary | ICD-10-CM | POA: Diagnosis not present

## 2020-02-24 DIAGNOSIS — G8929 Other chronic pain: Secondary | ICD-10-CM | POA: Diagnosis not present

## 2020-02-24 DIAGNOSIS — I252 Old myocardial infarction: Secondary | ICD-10-CM | POA: Diagnosis not present

## 2020-02-24 DIAGNOSIS — E1140 Type 2 diabetes mellitus with diabetic neuropathy, unspecified: Secondary | ICD-10-CM | POA: Diagnosis not present

## 2020-02-26 ENCOUNTER — Other Ambulatory Visit: Payer: Self-pay

## 2020-02-26 ENCOUNTER — Encounter: Payer: Self-pay | Admitting: Podiatrist

## 2020-02-26 ENCOUNTER — Encounter (INDEPENDENT_AMBULATORY_CARE_PROVIDER_SITE_OTHER): Payer: PPO | Admitting: Ophthalmology

## 2020-02-26 ENCOUNTER — Ambulatory Visit: Payer: PPO | Admitting: Podiatrist

## 2020-02-26 DIAGNOSIS — E1140 Type 2 diabetes mellitus with diabetic neuropathy, unspecified: Secondary | ICD-10-CM | POA: Diagnosis not present

## 2020-02-26 DIAGNOSIS — M79609 Pain in unspecified limb: Secondary | ICD-10-CM | POA: Diagnosis not present

## 2020-02-26 DIAGNOSIS — B351 Tinea unguium: Secondary | ICD-10-CM

## 2020-02-26 DIAGNOSIS — L600 Ingrowing nail: Secondary | ICD-10-CM

## 2020-02-26 DIAGNOSIS — Z794 Long term (current) use of insulin: Secondary | ICD-10-CM | POA: Diagnosis not present

## 2020-02-26 NOTE — Patient Instructions (Signed)
Pregabalin (Lyrica) is the medicine you likely need adjusted to a higher dose-  Call your primary care doctor and let them know you are still having foot pain at night and your podiatrist recommended increasing the dose of this medication.

## 2020-02-29 ENCOUNTER — Encounter: Payer: Self-pay | Admitting: Physician Assistant

## 2020-02-29 NOTE — Progress Notes (Addendum)
Cardiology Office Note    Date:  03/02/2020   ID:  [name], DOB Aug 22, 1934, MRN [id number]  PCP:  [name], NP  Cardiologist:  Ena Dawley, MD  Electrophysiologist:  None   Chief Complaint: f/u PCI  History of Present Illness:   [name] is a 85 y.o. female with history of dementia, CAD (BMS to RCA 2004, DESx2 to mid and proximal RCA 2012, recent NSTEMI 02/2020 s/p DES to RCA), chronic diastolic CHF, DM, morbid obesity, remote gastric bypass and reversal, anemia, PMR, HLD, depression, OCD, anxiety, OA, COPD, prior PE/DVT, CKD stage III, breast CA who presents for post-hospital follow-up. She has multiple medical problems as outlined above. She was recently admitted earlier this month with chest pain/NSTEMI and underwent cath showing multivessel CAD but severe ISR in pRCA and m/dRCA lesion both treated with DES. There was diffuse mild to moderate disease in the LM as well as Lcx and LAD but nonobstructive and treated medically. LVEF was normal, borderline elevated LVEDP. 2D echo was technically difficult given habitus. Hospital course also notable for anemia requiring blood transfusion (has prior h/o anemia). IM discussed case with Dr. Irene Limbo, her oncologist, regarding ongoing maintenance Xarelto for prior h/o PE/DVT and the mutual decision was made to hold further Xarelto given need for DAPT and history of anemia.   She is seen back today with her daughter [name]. She is a poor historian. She does report that in general she is feeling better overall since last PCI.  She has chronic shortness of breath which she states has improved since last hospitalization. It hasn't fully resolved but better than prior baseline. She does state she feels generally blah in a way she cannot describe further. She reports a transient episode of sharp chest pain last week that was not like prior angina and had resolved spontaneously without further events. No pleuritic pain currently. She has  mild edema on exam - per daughter, possibly similar to prior but she's not totally sure. She is noted to be borderline tachycardic in clinic today without any focal localizing symptoms. No fevers or chills. Her daughter did notice the patient's girdle was dirty today but neither are unable to give any more details on what exactly this means or where the soiling was located on the girdle.    Labwork independently reviewed:  02/23/20 Hgb 9.5, WBC 15.8, Plt 278 (checked by cancer ctr), K 4.7, Cr 1.64 (c/w prior baseline), albumin 2.9 02/15/20 LDL 60   Past Medical History:  Diagnosis Date  . Allergy    takes Mucinex daily as needed  . Anemia, unspecified   . Anxiety    takes Clonazepam daily as needed  . Arthritis   . Breast cancer ([place])   . Chronic diastolic CHF (congestive heart failure) (H[facility])    takes Furosemide daily  . CKD (chronic kidney disease), stage III ([place])   . Coronary artery disease    a. s/p IMI 2004 tx with BMS to RCA;  b. s/p Promus DES to RCA 2/12 c. abnormal nuc 2016 -> cath with med rx. d. NSTEMI 02/2020  mv CAD with severe ISR in pRCA and m/dRCA lesion both treated with DES.  . Dementia ([place])   . Depression    takes Cymbalta daily  . Diabetes mellitus    insulin daily  . GERD (gastroesophageal reflux disease)    takes Protonix daily  . Gout, unspecified   . Headache    occasionally  . History of  blood clots   . Hyperlipidemia    takes Pravastatin daily  . Hypertension   . Insomnia   . Joint pain   . Joint swelling   . Morbid obesity ([place])   . Myocardial infarction ([place]) 2004  . Numbness   . Obstructive sleep apnea    does not wear cpap  . Osteoarthritis   . Osteoarthritis   . Osteoarthrosis, unspecified whether generalized or localized, lower leg   . Pain, chronic   . Polymyalgia rheumatica ([place])   . Pulmonary emboli ([place]) 01/2012   felt to need lifelong anticoagulation but Xarelto dc in 02/2020 due to need for DAPT  . Urinary incontinence     takes Linzess daily  . Vertigo    hx of;was taking Meclizine if needed  . Wears dentures     Past Surgical History:  Procedure Laterality Date  . ABDOMINAL HYSTERECTOMY     partial  . APPENDECTOMY    . blood clots/legs and lungs  2013  . BREAST BIOPSY Left 07/22/2014  . BREAST BIOPSY Left 02/10/2013  . BREAST LUMPECTOMY Left 11/05/2014  . BREAST LUMPECTOMY WITH RADIOACTIVE SEED LOCALIZATION Left 11/05/2014   Procedure: LEFT BREAST LUMPECTOMY WITH RADIOACTIVE SEED LOCALIZATION;  Surgeon: [name], MD;  Location: [facility];  Service: General;  Laterality: Left;  . CARDIAC CATHETERIZATION    . COLONOSCOPY    . CORONARY ANGIOPLASTY  2  . CORONARY STENT INTERVENTION N/A 02/17/2020   Procedure: CORONARY STENT INTERVENTION;  Surgeon: Leonie Man, MD;  Location: [facility] [facility];  Service: Cardiovascular;  Laterality: N/A;  . ESOPHAGOGASTRODUODENOSCOPY (EGD) WITH PROPOFOL N/A 11/07/2016   Procedure: ESOPHAGOGASTRODUODENOSCOPY (EGD) WITH PROPOFOL;  Surgeon: Gatha Mayer, MD;  Location: [facility];  Service: Endoscopy;  Laterality: N/A;  . EXCISION OF SKIN TAG Right 11/05/2014   Procedure: EXCISION OF RIGHT EYELID SKIN TAG;  Surgeon: [name], MD;  Location: [place];  Service: General;  Laterality: Right;  . EYE SURGERY Bilateral    cataract   . GASTRIC BYPASS  1977    reversed in 1979, [place] CATH AND CORONARY ANGIOGRAPHY N/A 02/17/2020   Procedure: LEFT HEART CATH AND CORONARY ANGIOGRAPHY;  Surgeon: Leonie Man, MD;  Location: [place] [facility];  Service: Cardiovascular;  Laterality: N/A;  . LEFT HEART CATHETERIZATION WITH CORONARY ANGIOGRAM N/A 06/29/2014   Procedure: LEFT HEART CATHETERIZATION WITH CORONARY ANGIOGRAM;  Surgeon: Troy Sine, MD;  Location[facility];  Service: Cardiovascular;  Laterality: N/A;  . MEMBRANE PEEL Right 10/23/2018   Procedure: MEMBRANE PEEL;  Surgeon: Hurman Horn, MD;  Location: [place] [facility];  Service:  Ophthalmology;  Laterality: Right;  . MI with stent placement  2004  . PARS PLANA VITRECTOMY Right 10/23/2018   Procedure: PARS PLANA VITRECTOMY WITH 25 GAUGE;  Surgeon: Hurman Horn, MD;  Location: [facility];  Service: Ophthalmology;  Laterality: Right;    Current Medications: Current Meds  Medication Sig  . acetaminophen (TYLENOL) 500 MG tablet Take 1,000 mg by mouth daily.  [name] albuterol (PROAIR HFA) 108 (90 Base) MCG/ACT inhaler Inhale 1-2 puffs into the lungs every 6 (six) hours as needed for wheezing or shortness of breath.  . anastrozole (ARIMIDEX) 1 MG tablet TAKE 1 TABLET BY MOUTH EVERY DAY  . aspirin EC 81 MG EC tablet Take 1 tablet (81 mg total) by mouth daily. Swallow whole.  [name] atorvastatin (LIPITOR) 80 MG tablet Take 1 tablet (80 mg total) by mouth daily.  [name]  budesonide-formoterol (SYMBICORT) 160-4.5 MCG/ACT inhaler Inhale 2 puffs into the lungs 2 (two) times daily. For bronchitis  . cetirizine (ZYRTEC) 10 MG tablet Take 1 tablet (10 mg total) by mouth daily.  . clopidogrel (PLAVIX) 75 MG tablet Take 1 tablet (75 mg total) by mouth daily with breakfast.  . Continuous Blood Gluc Sensor (FREESTYLE LIBRE 14 DAY SENSOR) MISC USE UNIT TO TEST FOUR TIMES DAILY, BEFORE MEALS AND AT BEDTIME.  [name] Cyanocobalamin (VITAMIN B-12 PO) Take 1 tablet by mouth every evening.  . DULoxetine (CYMBALTA) 60 MG capsule TAKE 1 CAPSULE BY MOUTH EVERY DAY FOR ANXIETY  . ergocalciferol (VITAMIN D2) 1.25 MG (50000 UT) capsule Take 1 capsule (50,000 Units total) by mouth once a week.  . febuxostat (ULORIC) 40 MG tablet TAKE 1 TABLET BY MOUTH EVERY DAY  . fluticasone (FLONASE) 50 MCG/ACT nasal spray Place 1 spray into both nostrils in the morning and at bedtime.  . furosemide (LASIX) 20 MG tablet TAKE 1 TABLET BY MOUTH EVERY DAY AS NEEDED  . hydrALAZINE (APRESOLINE) 50 MG tablet Take 1 tablet (50 mg total) by mouth 2 (two) times daily.  . hydrOXYzine (ATARAX/VISTARIL) 10 MG tablet Take 1 tablet by mouth three times  daily as needed  . insulin aspart (NOVOLOG FLEXPEN) 100 UNIT/ML FlexPen Inject 16 Units into the skin 3 (three) times daily with meals. Max daily dose 70 units  . insulin glargine, 2 Unit Dial, (TOUJEO MAX SOLOSTAR) 300 UNIT/ML Solostar Pen Inject 38 Units into the skin daily. Patient assistance program provides  . isosorbide mononitrate (IMDUR) 60 MG 24 hr tablet TAKE 1 TABLET BY MOUTH EVERY DAY  . meclizine (ANTIVERT) 25 MG tablet TAKE 1 TABLET BY MOUTH THREE TIMES DAILY AS NEEDED FOR DIZZINESS  . memantine (NAMENDA) 5 MG tablet TAKE 2 TABLETS BY MOUTH EVERY MORNING and TAKE 1 TABLET EVERY EVENING FOR memory  . metoprolol succinate (TOPROL-XL) 50 MG 24 hr tablet Take 50 mg by mouth daily.  . nitroGLYCERIN (NITROSTAT) 0.4 MG SL tablet Take one tablet under the tongue every 5 minutes as needed for chest pain  . pantoprazole (PROTONIX) 40 MG tablet TAKE 1 TABLET BY MOUTH EVERY DAY  . pregabalin (LYRICA) 25 MG capsule Take 1 capsule (25 mg total) by mouth 2 (two) times daily.  . sodium polystyrene (SPS) 15 GM/60ML suspension TAKE 15 grams (60 mls) BY MOUTH ONCE WEEKLY TO keep potassium down  . topiramate (TOPAMAX) 25 MG tablet Take 1 tablet (25 mg total) by mouth daily.    Allergies:   Sulfonamide derivatives, Aricept [donepezil hcl], and Tramadol   Social History   Socioeconomic History  . Marital status: Widowed    Spouse name: Not on file  . Number of children: Not on file  . Years of education: Not on file  . Highest education level: Not on file  Occupational History  . Occupation: retired  Tobacco Use  . Smoking status: Never Smoker  . Smokeless tobacco: Never Used  Vaping Use  . Vaping Use: Never used  Substance and Sexual Activity  . Alcohol use: No    Alcohol/week: 0.0 standard drinks  . Drug use: No  . Sexual activity: Not Currently  Other Topics Concern  . Not on file  Social History Narrative  . Not on file   Social Determinants of Health   Financial Resource  Strain:   . Difficulty of Paying Living Expenses: Not on file  Food Insecurity:   . Worried About Charity fundraiser in  the Last Year: Not on file  . Ran Out of Food in the Last Year: Not on file  Transportation Needs:   . Lack of Transportation (Medical): Not on file  . Lack of Transportation (Non-Medical): Not on file  Physical Activity:   . Days of Exercise per Week: Not on file  . Minutes of Exercise per Session: Not on file  Stress:   . Feeling of Stress : Not on file  Social Connections:   . Frequency of Communication with Friends and Family: Not on file  . Frequency of Social Gatherings with Friends and Family: Not on file  . Attends Religious Services: Not on file  . Active Member of Clubs or Organizations: Not on file  . Attends Archivist Meetings: Not on file  . Marital Status: Not on file     Family History:   [name] family history includes Arthritis in her father and sister; Breast cancer (age of onset: 36) in her mother; Diabetes in her father and sister; Heart disease in her cousin and mother; Hypertension in her father; Obesity in her sister; Throat cancer in her father. There is no history of Colon cancer, Stomach cancer, or Esophageal cancer.  ROS:   Please see the history of present illness.  All other systems are reviewed and otherwise negative.    EKGs/Labs/Other Studies Reviewed:    Studies reviewed are outlined and summarized above. Reports included below if pertinent.  2D Echo 02/15/20 1. Technically difficult echo with poor image quality.  2. Left ventricular ejection fraction, by estimation, is 65 to 70%. The  left ventricle has normal function. The left ventricle has no regional  wall motion abnormalities. Left ventricular diastolic parameters are  consistent with Grade I diastolic  dysfunction (impaired relaxation).  3. Right ventricular systolic function was not well visualized. The right  ventricular size is not well  visualized.  4. The mitral valve was not well visualized. No evidence of mitral valve  regurgitation.  5. The aortic valve was not well visualized. Aortic valve regurgitation  is not visualized.   Cath 00/93/81  LV end diastolic pressure is mildly elevated.  ------------------------------  Mid LM to Prox LAD lesion is 30% stenosed with 30% stenosed side branch in Ost Cx.  Prox LAD to Mid LAD lesion is 45% stenosed with 25% stenosed side branch in 1st Diag.  1st Mrg lesion is 100% stenosed.  ----------------  Colon [place] RCA to Prox RCA stent is 10% stenosed.  Prox RCA to Mid RCA stent is 5% stenosed.  Prox RCA lesion is 95% stenosed, between the 2 old stents  A drug-eluting stent was successfully placed using a STENT RESOLUTE ONYX 4.0X18 -> postdilated to 4.6-4.7 mm  Post intervention, there is a 0% residual stenosis.  ----------------  Mid RCA lesion is 85% stenosed after the second stent  A drug-eluting stent was successfully placed using a STENT RESOLUTE ONYX 3.0X18 -> tapered post dilation from 4.2-3.3 mm  Post intervention, there is a 0% residual stenosis.   SUMMARY  Multivessel CAD with severe (95%) IntraStent stenosis in the proximal RCA and de novo stenosis distal to mid RCA stent (85%) -> both treated with DES PCI using Resolute Onyx DES stents (distal-3.0 mm x 18 mm with tapered post dilation 4.2-3.3 mm; proximal 4.0 mm 18 mm postdilated to 4.7 mm.)  Diffuse mild to moderate calcified disease in the distal Left Main as well as ostial and proximal LCx and LAD with no severe stenoses.  Borderline  elevated LVEDP with severe systemic hypertension   RECOMMENDATIONS  Return to nursing unit for ongoing care.  Okay to DC heparin.  We will need to determine plus or minus the use of Xarelto plus Plavix.  For now would continue aspirin plus Plavix until discharge and then discontinue aspirin on discharge with Xarelto plus Plavix for 3-6 months.  Continue to monitor  anemia and other risk factors.   Discussed with Dr. Angelena Form.    Glenetta Hew, MD     EKG:  EKG is ordered today, personally reviewed, demonstrating sinus tach 103bpm no acute STT changes (reviewed with Dr. Quentin Ore)  Recent Labs: 02/23/2020: ALT 6; BUN 29; Creatinine 1.64; Hemoglobin 9.5; Platelets 278; Potassium 4.7; Sodium 142  Recent Lipid Panel    Component Value Date/Time   CHOL 117 02/15/2020 0636   CHOL 136 04/20/2015 1001   TRIG 85 02/15/2020 0636   HDL 40 (L) 02/15/2020 0636   HDL 40 04/20/2015 1001   CHOLHDL 2.9 02/15/2020 0636   VLDL 17 02/15/2020 0636   LDLCALC 60 02/15/2020 0636   LDLCALC 58 12/23/2019 1021    PHYSICAL EXAM:    VS:  BP 128/66   Pulse (!) 103   Ht 5\' 7"  (1.702 m)   Wt (!) 338 lb (153.3 kg) Comment: Pt stated her weight could not stand on scale  SpO2 99%   BMI 52.94 kg/m   BMI: Body mass index is 52.94 kg/m.  GEN: Well nourished, well developed morbidly obese AAF, in no acute distress HEENT: normocephalic, atraumatic Neck: no JVD, carotid bruits, or masses Cardiac: Reg rhythm, borderline tachycardic, no murmurs, rubs, or gallops, mild pitting lower extremity edema  Respiratory:  clear to auscultation bilaterally, normal work of breathing GI: soft, nontender, nondistended, + BS MS: no deformity or atrophy Skin: warm and dry, no rash, right radial cath site without hematoma or ecchymosis; good pulse. Neuro:  Alert and Oriented to self, place but vague historian, in wheelchair Psych: euthymic mood, full affect  Wt Readings from Last 3 Encounters:  03/02/20 (!) 338 lb (153.3 kg)  02/23/20 (!) 338 lb 1.6 oz (153.4 kg)  02/23/20 (!) 338 lb (153.3 kg)     ASSESSMENT & PLAN:   1. CAD with recent NSTEMI - symptomatically stable since recent PCI. Had transient fleeting chest pain last week that was not like prior angina and has since resolved. She is noted to be mildly tachycardic in clinic today for unclear reasons.  We will check a  CBC, BMET, TSH today. Will also obtain d-dimer given recent cessation of Xarelto. If elevated, will need to consider LE venous duplex/VQ scan if level is above age-adjusted normal. Will sign out pending labs to on call APP. She is unfortunately a challenging historian so symptoms may not be the most reliable. Otherwise plan to continue dual antiplatelet therapy, statin, Imdur, and metoprolol for now. Addendum 03/03/2020: d-dimer returned markedly elevated at 5.49 (previous value last year was 0.6). This is concerning to me for recurrent VTE. Unfortunately given her dementia, renal insufficiency, anemia requiring acute transfusion recently, I do not think she is a good candidate for outpatient evaluation of such. Have been in communication with our triage team to request patient proceed to ED for further evaluation of such.  2. Sinus tachycardia - work-up as noted above.  No localizing cardiac symptoms to suggest cardiac cause otherwise, so if evaluation above is normal, would recommend she follow-up with her primary care for monitoring. 3. Hyperlipidemia - pravastatin recently changed  to atorvastatin in the hospital.  Previous LFTs were normal. Recent LDL was 60. At time of follow-up visit, we can arrange recheck of cholesterol/LFTs if she is tolerating statin at that time. 4. Chronic diastolic CHF - no SOB, lungs clear, mild chronic edema noted. Weight stable. Encouraged to continue PRN Lasix as directed and f/u BMET today. 5. History of anemia, normocytic - f/u CBC today given her h/o anemia.  Disposition: F/u with Dr. Meda Coffee or myself in 3 months - will decide on closer follow-up if tachycardia workup otherwise unrevealing as above. Addendum 03/03/2020: will request 2 week f/u instead of 3 months given elevated d-dimer and return to hospital.  Medication Adjustments/Labs and Tests Ordered: Current medicines are reviewed at length with the patient today.  Concerns regarding medicines are outlined above.  Medication changes, Labs and Tests ordered today are summarized above and listed in the Patient Instructions accessible in Encounters. TOC phone call was arranged by PCP not our office so regular visit performed.  Signed, Charlie Pitter, PA-C  03/02/2020 4:22 PM    [facility] [address], [place], [place]  [address] Phone: [phone]; Fax: [phone]

## 2020-03-01 ENCOUNTER — Encounter (INDEPENDENT_AMBULATORY_CARE_PROVIDER_SITE_OTHER): Payer: PPO | Admitting: Ophthalmology

## 2020-03-02 ENCOUNTER — Encounter: Payer: Self-pay | Admitting: Physician Assistant

## 2020-03-02 ENCOUNTER — Other Ambulatory Visit: Payer: Self-pay

## 2020-03-02 ENCOUNTER — Ambulatory Visit: Payer: PPO | Admitting: Physician Assistant

## 2020-03-02 VITALS — BP 128/66 | HR 103 | Ht 67.0 in | Wt 338.0 lb

## 2020-03-02 DIAGNOSIS — I2510 Atherosclerotic heart disease of native coronary artery without angina pectoris: Secondary | ICD-10-CM | POA: Diagnosis not present

## 2020-03-02 DIAGNOSIS — D649 Anemia, unspecified: Secondary | ICD-10-CM | POA: Diagnosis not present

## 2020-03-02 DIAGNOSIS — R000 Tachycardia, unspecified: Secondary | ICD-10-CM | POA: Diagnosis not present

## 2020-03-02 DIAGNOSIS — E785 Hyperlipidemia, unspecified: Secondary | ICD-10-CM

## 2020-03-02 DIAGNOSIS — I214 Non-ST elevation (NSTEMI) myocardial infarction: Secondary | ICD-10-CM

## 2020-03-02 DIAGNOSIS — I5032 Chronic diastolic (congestive) heart failure: Secondary | ICD-10-CM | POA: Diagnosis not present

## 2020-03-02 LAB — BASIC METABOLIC PANEL
BUN/Creatinine Ratio: 20 (ref 12–28)
BUN: 29 mg/dL — ABNORMAL HIGH (ref 8–27)
CO2: 26 mmol/L (ref 20–29)
Calcium: 8.6 mg/dL — ABNORMAL LOW (ref 8.7–10.3)
Chloride: 103 mmol/L (ref 96–106)
Creatinine, Ser: 1.47 mg/dL — ABNORMAL HIGH (ref 0.57–1.00)
GFR calc Af Amer: 38 mL/min/{1.73_m2} — ABNORMAL LOW (ref >59)
GFR calc non Af Amer: 33 mL/min/{1.73_m2} — ABNORMAL LOW (ref >59)
Glucose: 277 mg/dL — ABNORMAL HIGH (ref 65–99)
Potassium: 4.4 mmol/L (ref 3.5–5.2)
Sodium: 141 mmol/L (ref 134–144)

## 2020-03-02 LAB — CBC
Hematocrit: 29.5 % — ABNORMAL LOW (ref 34.0–46.6)
Hemoglobin: 9.3 g/dL — ABNORMAL LOW (ref 11.1–15.9)
MCH: 27.6 pg (ref 26.6–33.0)
MCHC: 31.5 g/dL (ref 31.5–35.7)
MCV: 88 fL (ref 79–97)
Platelets: 254 10*3/uL (ref 150–450)
RBC: 3.37 x10E6/uL — ABNORMAL LOW (ref 3.77–5.28)
RDW: 13.1 % (ref 11.7–15.4)
WBC: 11.8 10*3/uL — ABNORMAL HIGH (ref 3.4–10.8)

## 2020-03-02 LAB — D-DIMER, QUANTITATIVE: D-DIMER: 5.49 mg/L FEU — ABNORMAL HIGH (ref 0.00–0.49)

## 2020-03-02 NOTE — Patient Instructions (Addendum)
Medication Instructions:  Your physician recommends that you continue on your current medications as directed. Please refer to the Current Medication list given to you today.  *If you need a refill on your cardiac medications before your next appointment, please call your pharmacy*   Lab Work: TODAY:  STAT DDIMER, CBC, BMET, & TSH    If you have labs (blood work) drawn today and your tests are completely normal, you will receive your results only by: [name] MyChart Message (if you have MyChart) OR . A paper copy in the mail If you have any lab test that is abnormal or we need to change your treatment, we will call you to review the results.    Testing/Procedures: None ordered   Follow-Up: At [facility], you and your health needs are our priority.  As part of our continuing mission to provide you with exceptional heart care, we have created designated Provider Care Teams.  These Care Teams include your primary Cardiologist (physician) and Advanced Practice Providers (APPs -  Physician Assistants and Nurse Practitioners) who all work together to provide you with the care you need, when you need it.  We recommend signing up for the patient portal called "MyChart".  Sign up information is provided on this After Visit Summary.  MyChart is used to connect with patients for Virtual Visits (Telemedicine).  Patients are able to view lab/test results, encounter notes, upcoming appointments, etc.  Non-urgent messages can be sent to your provider as well.   To learn more about what you can do with MyChart, go to [email or web address].    Your next appointment:   3 month(s)  The format for your next appointment:   In Person  Provider:   Ena Dawley, MD or Melina Copa, PA-C   Other Instructions

## 2020-03-03 ENCOUNTER — Other Ambulatory Visit: Payer: Self-pay

## 2020-03-03 ENCOUNTER — Telehealth: Payer: Self-pay

## 2020-03-03 ENCOUNTER — Emergency Department (HOSPITAL_COMMUNITY): Payer: PPO

## 2020-03-03 ENCOUNTER — Encounter (HOSPITAL_COMMUNITY): Payer: Self-pay

## 2020-03-03 ENCOUNTER — Emergency Department (HOSPITAL_BASED_OUTPATIENT_CLINIC_OR_DEPARTMENT_OTHER): Payer: PPO

## 2020-03-03 ENCOUNTER — Emergency Department (HOSPITAL_COMMUNITY)
Admission: EM | Admit: 2020-03-03 | Discharge: 2020-03-03 | Disposition: A | Discharge status: Home / Self Care | Payer: PPO | Attending: Emergency Medicine | Admitting: Emergency Medicine

## 2020-03-03 DIAGNOSIS — E1122 Type 2 diabetes mellitus with diabetic chronic kidney disease: Secondary | ICD-10-CM | POA: Diagnosis not present

## 2020-03-03 DIAGNOSIS — Z853 Personal history of malignant neoplasm of breast: Secondary | ICD-10-CM | POA: Diagnosis not present

## 2020-03-03 DIAGNOSIS — R0789 Other chest pain: Secondary | ICD-10-CM | POA: Diagnosis not present

## 2020-03-03 DIAGNOSIS — I5032 Chronic diastolic (congestive) heart failure: Secondary | ICD-10-CM | POA: Insufficient documentation

## 2020-03-03 DIAGNOSIS — R7989 Other specified abnormal findings of blood chemistry: Secondary | ICD-10-CM

## 2020-03-03 DIAGNOSIS — N1832 Chronic kidney disease, stage 3b: Secondary | ICD-10-CM | POA: Diagnosis not present

## 2020-03-03 DIAGNOSIS — Z7982 Long term (current) use of aspirin: Secondary | ICD-10-CM | POA: Diagnosis not present

## 2020-03-03 DIAGNOSIS — I517 Cardiomegaly: Secondary | ICD-10-CM | POA: Diagnosis not present

## 2020-03-03 DIAGNOSIS — I130 Hypertensive heart and chronic kidney disease with heart failure and stage 1 through stage 4 chronic kidney disease, or unspecified chronic kidney disease: Secondary | ICD-10-CM | POA: Diagnosis not present

## 2020-03-03 DIAGNOSIS — R0602 Shortness of breath: Secondary | ICD-10-CM | POA: Insufficient documentation

## 2020-03-03 DIAGNOSIS — Z79899 Other long term (current) drug therapy: Secondary | ICD-10-CM | POA: Diagnosis not present

## 2020-03-03 DIAGNOSIS — Z9012 Acquired absence of left breast and nipple: Secondary | ICD-10-CM | POA: Diagnosis not present

## 2020-03-03 DIAGNOSIS — Z794 Long term (current) use of insulin: Secondary | ICD-10-CM | POA: Diagnosis not present

## 2020-03-03 DIAGNOSIS — R000 Tachycardia, unspecified: Secondary | ICD-10-CM | POA: Diagnosis not present

## 2020-03-03 DIAGNOSIS — E1165 Type 2 diabetes mellitus with hyperglycemia: Secondary | ICD-10-CM | POA: Diagnosis not present

## 2020-03-03 DIAGNOSIS — F0390 Unspecified dementia without behavioral disturbance: Secondary | ICD-10-CM | POA: Diagnosis not present

## 2020-03-03 DIAGNOSIS — J449 Chronic obstructive pulmonary disease, unspecified: Secondary | ICD-10-CM | POA: Diagnosis not present

## 2020-03-03 LAB — CBC
HCT: 32.5 % — ABNORMAL LOW (ref 36.0–46.0)
Hemoglobin: 9.3 g/dL — ABNORMAL LOW (ref 12.0–15.0)
MCH: 26.6 pg (ref 26.0–34.0)
MCHC: 28.6 g/dL — ABNORMAL LOW (ref 30.0–36.0)
MCV: 92.9 fL (ref 80.0–100.0)
Platelets: 283 10*3/uL (ref 150–400)
RBC: 3.5 MIL/uL — ABNORMAL LOW (ref 3.87–5.11)
RDW: 14.2 % (ref 11.5–15.5)
WBC: 11.3 10*3/uL — ABNORMAL HIGH (ref 4.0–10.5)
nRBC: 0 % (ref 0.0–0.2)

## 2020-03-03 LAB — BASIC METABOLIC PANEL
Anion gap: 11 (ref 5–15)
BUN: 32 mg/dL — ABNORMAL HIGH (ref 8–23)
CO2: 23 mmol/L (ref 22–32)
Calcium: 8.6 mg/dL — ABNORMAL LOW (ref 8.9–10.3)
Chloride: 104 mmol/L (ref 98–111)
Creatinine, Ser: 1.98 mg/dL — ABNORMAL HIGH (ref 0.44–1.00)
GFR, Estimated: 25 mL/min — ABNORMAL LOW (ref >60)
Glucose, Bld: 254 mg/dL — ABNORMAL HIGH (ref 70–99)
Potassium: 4 mmol/L (ref 3.5–5.1)
Sodium: 138 mmol/L (ref 135–145)

## 2020-03-03 LAB — TSH: TSH: 2.37 u[IU]/mL (ref 0.450–4.500)

## 2020-03-03 MED ORDER — TECHNETIUM TO 99M ALBUMIN AGGREGATED
4.1000 | Freq: Once | INTRAVENOUS | Status: AC | PRN
  Administered 2020-03-03: 4.1 via INTRAVENOUS

## 2020-03-03 NOTE — Telephone Encounter (Signed)
Called the patient and spoke with her daughter who was made aware of lab results. Advised to take patient to ED for COVID test and PE/DVT rule out due to elevated D-Dimer. Patients daughter wrote down notes from this phone call to tell ED personnel. Informed patient that someone from scheduling would be in touch to move up her f/u appointment.

## 2020-03-03 NOTE — ED Provider Notes (Signed)
[facility] EMERGENCY DEPARTMENT Provider Note   CSN: [id number] Arrival date & time: 03/03/20  1214     History Chief Complaint  Patient presents with  . Shortness of Breath    [name] is a 85 y.o. female w PMHx extensive cardiac hx including recent NSTEMI with PCI about 2 weeks ago, presenting to the emergency department for elevated D-dimer.  She was evaluated at her cardiologist office yesterday.  She reported vague intermittent chest pains in addition to her chronic shortness of breath.  She has history of PE/DVT and is no longer on Xarelto since last admission this month.  There was some concern for possible PE with elevated D-dimer obtained yesterday of 5.5.  Sent in for evaluation of possible PE.  Per cardiology note, there was no concern for recurrent ACS.  On exam, patient is a rather poor historian, reports intermittent "twinges" of chest pain beneath her left breast that has been occurring on and off since the weekend.  Symptoms are not exertional or radiating.  They do not feel similar to prior MI.  No medications taken for symptoms.  Denies new unilateral leg swelling or pain, cough, fevers.  The history is provided by the patient and medical records.       Past Medical History:  Diagnosis Date  . Allergy    takes Mucinex daily as needed  . Anemia, unspecified   . Anxiety    takes Clonazepam daily as needed  . Arthritis   . Breast cancer ([place])   . Chronic diastolic CHF (congestive heart failure) (HCC)    takes Furosemide daily  . CKD (chronic kidney disease), stage III ([place])   . Coronary artery disease    a. s/p IMI 2004 tx with BMS to RCA;  b. s/p Promus DES to RCA 2/12 c. abnormal nuc 2016 -> cath with med rx. d. NSTEMI 02/2020  mv CAD with severe ISR in pRCA and m/dRCA lesion both treated with DES.  . Dementia ([place])   . Depression    takes Cymbalta daily  . Diabetes mellitus    insulin daily  . GERD (gastroesophageal reflux  disease)    takes Protonix daily  . Gout, unspecified   . Headache    occasionally  . History of blood clots   . Hyperlipidemia    takes Pravastatin daily  . Hypertension   . Insomnia   . Joint pain   . Joint swelling   . Morbid obesity ([place])   . Myocardial infarction ([place]) 2004  . Numbness   . Obstructive sleep apnea    does not wear cpap  . Osteoarthritis   . Osteoarthritis   . Osteoarthrosis, unspecified whether generalized or localized, lower leg   . Pain, chronic   . Polymyalgia rheumatica ([place])   . Pulmonary emboli ([place]) 01/2012   felt to need lifelong anticoagulation but Xarelto dc in 02/2020 due to need for DAPT  . Urinary incontinence    takes Linzess daily  . Vertigo    hx of;was taking Meclizine if needed  . Wears dentures     Patient Active Problem List   Diagnosis Date Noted  . Presence of stent in right coronary artery   . Non-STEMI (non-ST elevated myocardial infarction) ([place]) 02/15/2020  . Chest pain on exertion 02/14/2020  . Moderate nonproliferative diabetic retinopathy of both eyes without macular edema associated with type 2 diabetes mellitus ([place]) 08/27/2019  . Pulmonary embolus ([place]) 07/15/2019  . Type 2 diabetes  mellitus with hyperglycemia, with long-term current use of insulin ([place]) 03/25/2019  . Type 2 diabetes mellitus with diabetic neuropathy, with long-term current use of insulin ([place]) 12/13/2017  . Hyperkalemia 07/26/2017  . Anxiety associated with depression 06/08/2017  . GERD with esophagitis 11/15/2016  . Memory loss 11/15/2016  . Multiple benign nevi 11/15/2016  . Esophageal dysphagia 11/07/2016  . Severe episode of recurrent major depressive disorder, without psychotic features ([place]) 06/23/2016  . Endometrial polyp 06/23/2016  . Vaginitis and vulvovaginitis 06/23/2016  . Cough variant asthma 11/17/2015  . Vitamin D deficiency 08/02/2015  . Breast cancer ([place]) 04/26/2015  . Dyslipidemia associated with type 2 diabetes mellitus ([place])  02/06/2015  . Type 2 diabetes mellitus with stage 3b chronic kidney disease, with long-term current use of insulin ([place]) 02/06/2015  . Chronic pain syndrome 02/06/2015  . Morbid obesity with BMI of 50.0-59.9, adult ([place]) 12/18/2014  . CKD (chronic kidney disease) stage 3, GFR 30-59 ml/min (HCC) 12/18/2014  . Gout 10/27/2014  . Breast mass 10/27/2014  . Encounter for therapeutic drug monitoring 10/08/2014  . Vertigo 09/24/2014  . Chronic bronchitis ([place]) 09/23/2014  . DOE (dyspnea on exertion) 06/24/2014  . CN (constipation) 05/10/2014  . History of pulmonary embolus (PE) 04/22/2014  . COPD (chronic obstructive pulmonary disease) ([place]) 04/21/2014  . Estrogen deficiency 02/19/2014  . Breast cancer screening 02/19/2014  . Weight gain 02/19/2014  . Pain in the chest 12/31/2013  . Type 2 diabetes mellitus with diabetic foot deformity ([place]) 07/29/2013  . OSA (obstructive sleep apnea) 02/24/2013  . Need for prophylactic vaccination and inoculation against influenza 01/08/2013  . Insomnia 09/17/2012  . Debility 08/01/2012  . Osteoarthritis 08/01/2012  . Dysphagia, unspecified(787.20) 07/31/2012  . GERD (gastroesophageal reflux disease) 07/31/2012  . Long term current use of anticoagulant therapy 02/02/2012  . Chronic diastolic congestive heart failure ([place]) 01/29/2012  . History of deep venous thrombosis 01/27/2012  . Abdominal pain 01/26/2012  . Situational depression 06/04/2009  . ORTHOPNEA 03/09/2009  . Morbid (severe) obesity due to excess calories ([place]) 02/15/2009  . Microcytic anemia 02/15/2009  . TENSION HEADACHE 01/07/2008  . Headache 01/07/2008  . Fungal dermatitis 11/13/2007  . CAD (coronary artery disease) 08/29/2007  . URINARY INCONTINENCE 08/29/2007  . Hyperlipidemia 12/03/2006  . Essential hypertension 12/03/2006  . Personal history of other diseases of the digestive system 12/03/2006    Past Surgical History:  Procedure Laterality Date  . ABDOMINAL HYSTERECTOMY       partial  . APPENDECTOMY    . blood clots/legs and lungs  2013  . BREAST BIOPSY Left 07/22/2014  . BREAST BIOPSY Left 02/10/2013  . BREAST LUMPECTOMY Left 11/05/2014  . BREAST LUMPECTOMY WITH RADIOACTIVE SEED LOCALIZATION Left 11/05/2014   Procedure: LEFT BREAST LUMPECTOMY WITH RADIOACTIVE SEED LOCALIZATION;  Surgeon: [name], MD;  Location: [facility];  Service: General;  Laterality: Left;  . CARDIAC CATHETERIZATION    . COLONOSCOPY    . CORONARY ANGIOPLASTY  2  . CORONARY STENT INTERVENTION N/A 02/17/2020   Procedure: CORONARY STENT INTERVENTION;  Surgeon: Leonie Man, MD;  Location: [facility];  Service: Cardiovascular;  Laterality: N/A;  . ESOPHAGOGASTRODUODENOSCOPY (EGD) WITH PROPOFOL N/A 11/07/2016   Procedure: ESOPHAGOGASTRODUODENOSCOPY (EGD) WITH PROPOFOL;  Surgeon: Gatha Mayer, MD;  Location: [facility];  Service: Endoscopy;  Laterality: N/A;  . EXCISION OF SKIN TAG Right 11/05/2014   Procedure: EXCISION OF RIGHT EYELID SKIN TAG;  Surgeon: [name], MD;  Location: [facility];  Service: General;  Laterality:  Right;  [name] EYE SURGERY Bilateral    cataract   . GASTRIC BYPASS  1977    reversed in 1979, [place] CATH AND CORONARY ANGIOGRAPHY N/A 02/17/2020   Procedure: LEFT HEART CATH AND CORONARY ANGIOGRAPHY;  Surgeon: Leonie Man, MD;  Location: [facility];  Service: Cardiovascular;  Laterality: N/A;  . LEFT HEART CATHETERIZATION WITH CORONARY ANGIOGRAM N/A 06/29/2014   Procedure: LEFT HEART CATHETERIZATION WITH CORONARY ANGIOGRAM;  Surgeon: Troy Sine, MD;  Location: [facility] CATH LAB;  Service: Cardiovascular;  Laterality: N/A;  . MEMBRANE PEEL Right 10/23/2018   Procedure: MEMBRANE PEEL;  Surgeon: Hurman Horn, MD;  Location: [facility];  Service: Ophthalmology;  Laterality: Right;  . MI with stent placement  2004  . PARS PLANA VITRECTOMY Right 10/23/2018   Procedure: PARS PLANA VITRECTOMY WITH 25 GAUGE;  Surgeon: Hurman Horn, MD;   Location: [facility];  Service: Ophthalmology;  Laterality: Right;     OB History   No obstetric history on file.     Family History  Problem Relation Age of Onset  . Breast cancer Mother 43  . Heart disease Mother   . Throat cancer Father   . Hypertension Father   . Arthritis Father   . Diabetes Father   . Arthritis Sister   . Obesity Sister   . Diabetes Sister   . Heart disease Cousin   . Colon cancer Neg Hx   . Stomach cancer Neg Hx   . Esophageal cancer Neg Hx     Social History   Tobacco Use  . Smoking status: Never Smoker  . Smokeless tobacco: Never Used  Vaping Use  . Vaping Use: Never used  Substance Use Topics  . Alcohol use: No    Alcohol/week: 0.0 standard drinks  . Drug use: No    Home Medications Prior to Admission medications   Medication Sig Start Date End Date Taking? Authorizing Provider  acetaminophen (TYLENOL) 500 MG tablet Take 1,000 mg by mouth daily.    [provider]  albuterol (PROAIR HFA) 108 (90 Base) MCG/ACT inhaler Inhale 1-2 puffs into the lungs every 6 (six) hours as needed for wheezing or shortness of breath. 12/19/18   [name], NP  anastrozole (ARIMIDEX) 1 MG tablet TAKE 1 TABLET BY MOUTH EVERY DAY 10/22/19   [name], MD  aspirin EC 81 MG EC tablet Take 1 tablet (81 mg total) by mouth daily. Swallow whole. 02/18/20   Mikhail, Velta Addison, DO  atorvastatin (LIPITOR) 80 MG tablet Take 1 tablet (80 mg total) by mouth daily. 02/18/20   Mikhail, Velta Addison, DO  budesonide-formoterol (SYMBICORT) 160-4.5 MCG/ACT inhaler Inhale 2 puffs into the lungs 2 (two) times daily. For bronchitis 09/03/18   [name], NP  cetirizine (ZYRTEC) 10 MG tablet Take 1 tablet (10 mg total) by mouth daily. 01/29/20   [name], NP  clopidogrel (PLAVIX) 75 MG tablet Take 1 tablet (75 mg total) by mouth daily with breakfast. 02/18/20   Cristal Ford, DO  Continuous Blood Gluc Sensor (FREESTYLE LIBRE 14 DAY SENSOR) MISC USE UNIT  TO TEST FOUR TIMES DAILY, BEFORE MEALS AND AT BEDTIME. 01/13/20   [name], NP  Cyanocobalamin (VITAMIN B-12 PO) Take 1 tablet by mouth every evening.    [provider]  DULoxetine (CYMBALTA) 60 MG capsule TAKE 1 CAPSULE BY MOUTH EVERY DAY FOR ANXIETY 10/22/19   [name], NP  ergocalciferol (VITAMIN D2) 1.25  MG (50000 UT) capsule Take 1 capsule (50,000 Units total) by mouth once a week. 07/28/19   [name], MD  febuxostat (ULORIC) 40 MG tablet TAKE 1 TABLET BY MOUTH EVERY DAY 10/23/19   [name], NP  fluticasone (FLONASE) 50 MCG/ACT nasal spray Place 1 spray into both nostrils in the morning and at bedtime. 09/12/19   [name], NP  furosemide (LASIX) 20 MG tablet TAKE 1 TABLET BY MOUTH EVERY DAY AS NEEDED 12/08/19   [name], NP  hydrALAZINE (APRESOLINE) 50 MG tablet Take 1 tablet (50 mg total) by mouth 2 (two) times daily. 12/18/19   [name], NP  hydrOXYzine (ATARAX/VISTARIL) 10 MG tablet Take 1 tablet by mouth three times daily as needed 11/27/19   [name], NP  insulin aspart (NOVOLOG FLEXPEN) 100 UNIT/ML FlexPen Inject 16 Units into the skin 3 (three) times daily with meals. Max daily dose 70 units 04/14/19   Shamleffer, [name], MD  insulin glargine, 2 Unit Dial, (TOUJEO MAX SOLOSTAR) 300 UNIT/ML Solostar Pen Inject 38 Units into the skin daily. Patient assistance program provides 12/23/19   Shamleffer, [name], MD  isosorbide mononitrate (IMDUR) 60 MG 24 hr tablet TAKE 1 TABLET BY MOUTH EVERY DAY 10/22/19   [name], NP  meclizine (ANTIVERT) 25 MG tablet TAKE 1 TABLET BY MOUTH THREE TIMES DAILY AS NEEDED FOR DIZZINESS 09/12/19   [name], NP  memantine (NAMENDA) 5 MG tablet TAKE 2 TABLETS BY MOUTH EVERY MORNING and TAKE 1 TABLET EVERY EVENING FOR memory 10/09/19   [name], NP  metoprolol succinate (TOPROL-XL) 50 MG 24 hr tablet Take 50 mg by mouth daily. 07/18/19   [provider]  nitroGLYCERIN (NITROSTAT) 0.4 MG SL tablet Take one tablet under the tongue every 5 minutes as needed for chest pain 09/24/13   [name], NP  pantoprazole (PROTONIX) 40 MG tablet TAKE 1 TABLET BY MOUTH EVERY DAY 12/18/19   [name], NP  pregabalin (LYRICA) 25 MG capsule Take 1 capsule (25 mg total) by mouth 2 (two) times daily. 02/23/20   [name], NP  sodium polystyrene (SPS) 15 GM/60ML suspension TAKE 15 grams (60 mls) BY MOUTH ONCE WEEKLY TO keep potassium down 11/17/19   [name], NP  topiramate (TOPAMAX) 25 MG tablet Take 1 tablet (25 mg total) by mouth daily. 10/22/19   [name], NP    Allergies    Sulfonamide derivatives, Aricept [donepezil hcl], and Tramadol  Review of Systems   Review of Systems  Respiratory: Positive for shortness of breath.   Cardiovascular: Positive for chest pain.  All other systems reviewed and are negative.   Physical Exam Updated Vital Signs BP 132/60   Pulse 73   Temp 97.9 F (36.6 C) (Oral)   Resp (!) 21   Ht 5\' 7"  (1.702 m)   Wt (!) 153.3 kg   SpO2 100%   BMI 52.94 kg/m   Physical Exam Vitals and nursing note reviewed.  Constitutional:      General: She is not in acute distress.    Appearance: She is well-developed. She is obese.  HENT:     Head: Normocephalic and atraumatic.  Eyes:     Conjunctiva/sclera: Conjunctivae normal.  Cardiovascular:     Rate and Rhythm: Normal rate and regular rhythm.  Pulmonary:     Effort: Pulmonary effort is normal.     Breath sounds: Normal breath sounds.  Abdominal:     General: Bowel sounds are normal.     Palpations: Abdomen is soft.     Tenderness: There is no abdominal tenderness.  Musculoskeletal:     Comments: Pretibial edema bilaterally.  No calf tenderness, redness or warmth.  Intact DP pulses bilaterally  Skin:    General: Skin is warm.  Neurological:     Mental Status: She is alert.  Psychiatric:        Behavior: Behavior  normal.     ED Results / Procedures / Treatments   Labs (all labs ordered are listed, but only abnormal results are displayed) Labs Reviewed  BASIC METABOLIC PANEL - Abnormal; Notable for the following components:      Result Value   Glucose, Bld 254 (*)    BUN 32 (*)    Creatinine, Ser 1.98 (*)    Calcium 8.6 (*)    GFR, Estimated 25 (*)    All other components within normal limits  CBC - Abnormal; Notable for the following components:   WBC 11.3 (*)    RBC 3.50 (*)    Hemoglobin 9.3 (*)    HCT 32.5 (*)    MCHC 28.6 (*)    All other components within normal limits    EKG None  Radiology DG Chest 2 View  Result Date: 03/03/2020 CLINICAL DATA:  Shortness of breath. EXAM: CHEST - 2 VIEW COMPARISON:  02/17/2020 FINDINGS: Mild enlargement the cardiac silhouette. Aortic atherosclerosis. No consolidation. No visible pleural effusions or pneumothorax. No acute osseous abnormality. Similar mild elevation the right hemidiaphragm. IMPRESSION: 1. No acute cardiopulmonary disease. 2. Mild cardiomegaly. Electronically Signed   By: Margaretha Sheffield MD   On: 03/03/2020 13:15   NM Pulmonary Perfusion  Result Date: 03/03/2020 CLINICAL DATA:  Positive D-dimer, tachycardia EXAM: NUCLEAR MEDICINE PERFUSION LUNG SCAN TECHNIQUE: Perfusion images were obtained in multiple projections after intravenous injection of radiopharmaceutical. Ventilation scans intentionally deferred if perfusion scan and chest x-ray adequate for interpretation during COVID 19 epidemic. RADIOPHARMACEUTICALS:  4.1 mCi Tc-54m MAA IV COMPARISON:  01/26/2012 FINDINGS: Improved perfusion to the left lung since prior study. No new discrete segmental or subsegmental perfusion defects to suggest pulmonary emboli. IMPRESSION: No scintigraphic evidence of interval pulmonary emboli. Electronically Signed   By: Lucrezia Europe M.D.   On: 03/03/2020 16:47   VAS [place] LOWER EXTREMITY VENOUS (DVT) (ONLY [facility] & [facility] 7a-7p)  Result Date: 03/03/2020   Lower Venous DVT Study Indications: Elevated Ddimer.  Risk Factors: None identified. Limitations: Body habitus, poor ultrasound/tissue interface and patient pain tolerance, patient positioning. Comparison Study: No prior studies. Performing Technologist: Oliver Hum RVT  Examination Guidelines: A complete evaluation includes B-mode imaging, spectral Doppler, color Doppler, and power Doppler as needed of all accessible portions of each vessel. Bilateral testing is considered an integral part of a complete examination. Limited examinations for reoccurring indications may be performed as noted. The reflux portion of the exam is performed with the patient in reverse Trendelenburg.  +---------+---------------+---------+-----------+----------+--------------+ RIGHT    CompressibilityPhasicitySpontaneityPropertiesThrombus Aging +---------+---------------+---------+-----------+----------+--------------+ CFV      Full           Yes      Yes                                 +---------+---------------+---------+-----------+----------+--------------+ SFJ      Full                                                        +---------+---------------+---------+-----------+----------+--------------+  FV Prox  Full                                                        +---------+---------------+---------+-----------+----------+--------------+ FV Mid                  Yes      Yes                                 +---------+---------------+---------+-----------+----------+--------------+ FV Distal               Yes      Yes                                 +---------+---------------+---------+-----------+----------+--------------+ PFV                                                   Not visualized +---------+---------------+---------+-----------+----------+--------------+ POP      Full           Yes      Yes                                  +---------+---------------+---------+-----------+----------+--------------+ PTV      Full                                                        +---------+---------------+---------+-----------+----------+--------------+ PERO                                                  Not visualized +---------+---------------+---------+-----------+----------+--------------+   +---------+---------------+---------+-----------+----------+--------------+ LEFT     CompressibilityPhasicitySpontaneityPropertiesThrombus Aging +---------+---------------+---------+-----------+----------+--------------+ CFV      Full           Yes      Yes                                 +---------+---------------+---------+-----------+----------+--------------+ SFJ      Full                                                        +---------+---------------+---------+-----------+----------+--------------+ FV Prox  Full                                                        +---------+---------------+---------+-----------+----------+--------------+ FV Mid   Full                                                        +---------+---------------+---------+-----------+----------+--------------+  FV DistalFull                                                        +---------+---------------+---------+-----------+----------+--------------+ PFV                                                   Not visualized +---------+---------------+---------+-----------+----------+--------------+ POP      Full           Yes      Yes                                 +---------+---------------+---------+-----------+----------+--------------+ PTV      Full                                                        +---------+---------------+---------+-----------+----------+--------------+ PERO                                                  Not visualized  +---------+---------------+---------+-----------+----------+--------------+     Summary: RIGHT: - There is no evidence of deep vein thrombosis in the lower extremity. However, portions of this examination were limited- see technologist comments above.  - No cystic structure found in the popliteal fossa.  LEFT: - There is no evidence of deep vein thrombosis in the lower extremity. However, portions of this examination were limited- see technologist comments above.  - No cystic structure found in the popliteal fossa.  *See table(s) above for measurements and observations.    Preliminary     Procedures Procedures (including critical care time)  Medications Ordered in ED Medications  technetium albumin aggregated (MAA) injection solution 4.1 millicurie (4.1 millicuries Intravenous Contrast Given 03/03/20 1622)    ED Course  I have reviewed the triage vital signs and the nursing notes.  Pertinent labs & imaging results that were available during my care of the patient were reviewed by me and considered in my medical decision making (see chart for details).    MDM Rules/Calculators/A&P                          Patient presenting for abnormal D-dimer drawn at cardiology office yesterday.  Recent NSTEMI with PCI earlier this month, cardiology felt presentation is unlikely due to recurrent ACS and was sent in for rule out of PE.  Patient has history of PE and DVT, however Xarelto was discontinued during last admission a couple of weeks ago.  She states her symptoms today do not feel similar to ACS either.  On exam she is in no acute distress, no signs of DVT.  Normal respiratory effort, heart rate, oxygen saturation.  Given patient's kidney function with a GFR of 25, pain is not indicated.  VQ scan obtained and is negative for evidence of PE.  Venous  ultrasound of bilateral lower extremities is also negative for evidence of DVT.  Patient is appropriate for discharge at this time, recommend she follow  closely with cardiologist return if symptoms worsen.  Patient discussed with and evaluated by Dr. Eulis Foster, who agrees with work-up and care plan for discharge.  Final Clinical Impression(s) / ED Diagnoses Final diagnoses:  Intermittent left-sided chest pain    Rx / DC Orders ED Discharge Orders    None       Christipher Rieger, [place] N, PA-C 03/03/20 1826    Daleen Bo, MD 03/06/20 [phone]

## 2020-03-03 NOTE — Progress Notes (Signed)
Bilateral lower extremity venous duplex has been completed. Preliminary results can be found in CV Proc through chart review.  Results were given to [place] Robinson PA.  03/03/20 5:14 PM Carlos Levering RVT

## 2020-03-03 NOTE — Telephone Encounter (Signed)
-----   Message from Charlie Pitter, [place] sent at 03/03/2020  8:34 AM EDT ----- Sending to triage per discussion with Georjean Mode since she is assisting Dr. Tamala Julian in clinic.   Patient seen in office yesterday s/p NSTEMI/PCI. During recent admission she was taken off Xarelto which she had been on for prior history of DVT/PE. This was previously supposed to be lifelong anticoagulation but given her stent she was put on ASA/Plavix and the decision was made by heme-onc to stop the Xarelto.   Yesterday when I saw her in clinic she was a vague historian, feeling "blah" but could not describe further, but no acute cardiac symptoms. She has dementia. Of concern she was newly tachycardic for unclear reasons. We obtained a stat d-dimer that I had signed out to on-call team but not clear that she got called with result. It is markedly elevated compared to prior values at 5.49 (previously 0.6). Otherwise labs abnormal but stable.  I think she needs further workup for recurrence of DVT/PE but given renal insufficiency, dementia, anemia requiring recent transfusion, I do not think this is safely accomplished as an outpatient. I would suggest her daughter take her to the ER for PE/DVT rule out - would have her write down the d-dimer value, note that her anticoagulation was recently stopped, and cardiology provider had concern for PE/DVT. We also see this kind of d-dimer elevation in things like Covid as well so this may need to be ruled out as a cause for her tachycardia as well. Can we also move her cardiology follow-up to 2 weeks instead of 3 months? Thanks.

## 2020-03-03 NOTE — ED Triage Notes (Signed)
Pt sent here by PCP for an elevated D-dimer. Pt reports ongoing SOB and chest tightness for several days

## 2020-03-03 NOTE — Discharge Instructions (Addendum)
Please follow close with your cardiologist regarding your visit today. Return the emergency department for any new or worsening symptoms.

## 2020-03-03 NOTE — ED Provider Notes (Signed)
  Face-to-face evaluation   History: She is here for evaluation of elevated D-dimer, ordered yesterday by her cardiologist during a follow-up after her PCI 2 weeks ago.  She was taken off Xarelto around that time, at the recommendation of cardiology because she is on Plavix she is not having ongoing chest pain.  Physical exam: Obese, alert, cooperative.  Chest mildly tender anteriorly, without crepitation.  No increased work of breathing.  Legs are nontender to palpation.   Medical screening examination/treatment/procedure(s) were conducted as a shared visit with non-physician practitioner(s) and myself.  I personally evaluated the patient during the encounter    Daleen Bo, MD 03/06/20 [phone]

## 2020-03-04 NOTE — Progress Notes (Signed)
Chief Complaint  Patient presents with  . Nail Problem    nail trim pain at night      HPI: Patient is 85 y.o. female who presents today for the concerns as listed above.   Patient Active Problem List   Diagnosis Date Noted  . Presence of stent in right coronary artery   . Non-STEMI (non-ST elevated myocardial infarction) ([place]) 02/15/2020  . Chest pain on exertion 02/14/2020  . Moderate nonproliferative diabetic retinopathy of both eyes without macular edema associated with type 2 diabetes mellitus ([place]) 08/27/2019  . Pulmonary embolus ([place]) 07/15/2019  . Type 2 diabetes mellitus with hyperglycemia, with long-term current use of insulin ([place]) 03/25/2019  . Type 2 diabetes mellitus with diabetic neuropathy, with long-term current use of insulin ([place]) 12/13/2017  . Hyperkalemia 07/26/2017  . Anxiety associated with depression 06/08/2017  . GERD with esophagitis 11/15/2016  . Memory loss 11/15/2016  . Multiple benign nevi 11/15/2016  . Esophageal dysphagia 11/07/2016  . Severe episode of recurrent major depressive disorder, without psychotic features ([place]) 06/23/2016  . Endometrial polyp 06/23/2016  . Vaginitis and vulvovaginitis 06/23/2016  . Cough variant asthma 11/17/2015  . Vitamin D deficiency 08/02/2015  . Breast cancer ([place]) 04/26/2015  . Dyslipidemia associated with type 2 diabetes mellitus ([place]) 02/06/2015  . Type 2 diabetes mellitus with stage 3b chronic kidney disease, with long-term current use of insulin ([place]) 02/06/2015  . Chronic pain syndrome 02/06/2015  . Morbid obesity with BMI of 50.0-59.9, adult ([place]) 12/18/2014  . CKD (chronic kidney disease) stage 3, GFR 30-59 ml/min (HCC) 12/18/2014  . Gout 10/27/2014  . Breast mass 10/27/2014  . Encounter for therapeutic drug monitoring 10/08/2014  . Vertigo 09/24/2014  . Chronic bronchitis ([place]) 09/23/2014  . DOE (dyspnea on exertion) 06/24/2014  . CN (constipation) 05/10/2014  . History of pulmonary embolus (PE)  04/22/2014  . COPD (chronic obstructive pulmonary disease) ([place]) 04/21/2014  . Estrogen deficiency 02/19/2014  . Breast cancer screening 02/19/2014  . Weight gain 02/19/2014  . Pain in the chest 12/31/2013  . Type 2 diabetes mellitus with diabetic foot deformity ([place]) 07/29/2013  . OSA (obstructive sleep apnea) 02/24/2013  . Need for prophylactic vaccination and inoculation against influenza 01/08/2013  . Insomnia 09/17/2012  . Debility 08/01/2012  . Osteoarthritis 08/01/2012  . Dysphagia, unspecified(787.20) 07/31/2012  . GERD (gastroesophageal reflux disease) 07/31/2012  . Long term current use of anticoagulant therapy 02/02/2012  . Chronic diastolic congestive heart failure ([place]) 01/29/2012  . History of deep venous thrombosis 01/27/2012  . Abdominal pain 01/26/2012  . Situational depression 06/04/2009  . ORTHOPNEA 03/09/2009  . Morbid (severe) obesity due to excess calories ([place]) 02/15/2009  . Microcytic anemia 02/15/2009  . TENSION HEADACHE 01/07/2008  . Headache 01/07/2008  . Fungal dermatitis 11/13/2007  . CAD (coronary artery disease) 08/29/2007  . URINARY INCONTINENCE 08/29/2007  . Hyperlipidemia 12/03/2006  . Essential hypertension 12/03/2006  . Personal history of other diseases of the digestive system 12/03/2006    Current Outpatient Medications on File Prior to Visit  Medication Sig Dispense Refill  . acetaminophen (TYLENOL) 500 MG tablet Take 1,000 mg by mouth daily.    [name] albuterol (PROAIR HFA) 108 (90 Base) MCG/ACT inhaler Inhale 1-2 puffs into the lungs every 6 (six) hours as needed for wheezing or shortness of breath. 6.7 g 1  . anastrozole (ARIMIDEX) 1 MG tablet TAKE 1 TABLET BY MOUTH EVERY DAY 90 tablet 1  . aspirin EC 81 MG EC tablet Take 1 tablet (81  mg total) by mouth daily. Swallow whole. 30 tablet 1  . atorvastatin (LIPITOR) 80 MG tablet Take 1 tablet (80 mg total) by mouth daily. 30 tablet 1  . budesonide-formoterol (SYMBICORT) 160-4.5 MCG/ACT inhaler  Inhale 2 puffs into the lungs 2 (two) times daily. For bronchitis 1 Inhaler 3  . cetirizine (ZYRTEC) 10 MG tablet Take 1 tablet (10 mg total) by mouth daily. 30 tablet 11  . clopidogrel (PLAVIX) 75 MG tablet Take 1 tablet (75 mg total) by mouth daily with breakfast. 30 tablet 1  . Continuous Blood Gluc Sensor (FREESTYLE LIBRE 14 DAY SENSOR) MISC USE UNIT TO TEST FOUR TIMES DAILY, BEFORE MEALS AND AT BEDTIME. 6 each 0  . Cyanocobalamin (VITAMIN B-12 PO) Take 1 tablet by mouth every evening.    . DULoxetine (CYMBALTA) 60 MG capsule TAKE 1 CAPSULE BY MOUTH EVERY DAY FOR ANXIETY 90 capsule 1  . ergocalciferol (VITAMIN D2) 1.25 MG (50000 UT) capsule Take 1 capsule (50,000 Units total) by mouth once a week. 12 capsule 3  . febuxostat (ULORIC) 40 MG tablet TAKE 1 TABLET BY MOUTH EVERY DAY 90 tablet 1  . fluticasone (FLONASE) 50 MCG/ACT nasal spray Place 1 spray into both nostrils in the morning and at bedtime. 11.1 mL 3  . furosemide (LASIX) 20 MG tablet TAKE 1 TABLET BY MOUTH EVERY DAY AS NEEDED 30 tablet 6  . hydrALAZINE (APRESOLINE) 50 MG tablet Take 1 tablet (50 mg total) by mouth 2 (two) times daily. 60 tablet 3  . hydrOXYzine (ATARAX/VISTARIL) 10 MG tablet Take 1 tablet by mouth three times daily as needed 90 tablet 1  . insulin aspart (NOVOLOG FLEXPEN) 100 UNIT/ML FlexPen Inject 16 Units into the skin 3 (three) times daily with meals. Max daily dose 70 units 63 mL 3  . insulin glargine, 2 Unit Dial, (TOUJEO MAX SOLOSTAR) 300 UNIT/ML Solostar Pen Inject 38 Units into the skin daily. Patient assistance program provides 15 mL 6  . isosorbide mononitrate (IMDUR) 60 MG 24 hr tablet TAKE 1 TABLET BY MOUTH EVERY DAY 90 tablet 1  . meclizine (ANTIVERT) 25 MG tablet TAKE 1 TABLET BY MOUTH THREE TIMES DAILY AS NEEDED FOR DIZZINESS 90 tablet 1  . memantine (NAMENDA) 5 MG tablet TAKE 2 TABLETS BY MOUTH EVERY MORNING and TAKE 1 TABLET EVERY EVENING FOR memory 90 tablet 5  . metoprolol succinate (TOPROL-XL) 50 MG  24 hr tablet Take 50 mg by mouth daily.    . nitroGLYCERIN (NITROSTAT) 0.4 MG SL tablet Take one tablet under the tongue every 5 minutes as needed for chest pain 50 tablet 0  . pantoprazole (PROTONIX) 40 MG tablet TAKE 1 TABLET BY MOUTH EVERY DAY 90 tablet 1  . pregabalin (LYRICA) 25 MG capsule Take 1 capsule (25 mg total) by mouth 2 (two) times daily. 60 capsule 3  . sodium polystyrene (SPS) 15 GM/60ML suspension TAKE 15 grams (60 mls) BY MOUTH ONCE WEEKLY TO keep potassium down 473 mL 2  . topiramate (TOPAMAX) 25 MG tablet Take 1 tablet (25 mg total) by mouth daily. 90 tablet 1   No current facility-administered medications on file prior to visit.    Allergies  Allergen Reactions  . Sulfonamide Derivatives Swelling    Mouth swelling  . Aricept [Donepezil Hcl] Diarrhea and Nausea And Vomiting    GI upset/loose stools  . Tramadol Nausea And Vomiting    Review of Systems No fevers, chills, nausea, muscle aches, no difficulty breathing, no calf pain, no chest pain or  shortness of breath.   Physical Exam  GENERAL APPEARANCE: Alert, conversant. Appropriately groomed. No acute distress.   VASCULAR: Pedal pulses palpable DP and PT bilateral.  Capillary refill time is immediate to all digits,  Proximal to distal cooling it warm to warm.  Digital perfusion adequate.   NEUROLOGIC: sensation is decreased to 5.07 monofilament at 25/5 sites bilateral.  Light touch is intact bilateral, vibratory sensation intact bilateral  MUSCULOSKELETAL: acceptable muscle strength, tone and stability bilateral.  Adducted digits 1-5 right  No pain, crepitus or limitation noted with foot and ankle range of motion bilateral.   DERMATOLOGIC: skin is warm, supple, and dry.  No open lesions noted.  No rash, no pre ulcerative lesions. Right hallux nail has been removed in the past and there is no nail there.  Left hallux nail is deeply incurvated causing pain with pressure.  She has no sign of infection, just pain.   Remainder of digital nails are elongated and brittle.    Assessment     ICD-10-CM   1. Type 2 diabetes mellitus with diabetic neuropathy, with long-term current use of insulin (HCC)  E11.40    Z79.4   2. Pain due to onychomycosis of nail  B35.1    M79.609       Plan  Discussed treatment options and alternatives.  Ms. Ayo is recovering from a recent heart attack, therefore I advised against doing any type of procedure to the ingrown left hallux nail until she is several months out from this event.  I carefully debrided the nails and smoothed.  I recommended she keep a close eye on the nail and look for any signs of infection.  We also discussed her gabapentin dosage, and I recommended her speak with her primary care doctor about possibly increasing the dosage for her.    She will return in 3 months and call if any concerns arise prior.

## 2020-03-08 DIAGNOSIS — I252 Old myocardial infarction: Secondary | ICD-10-CM | POA: Diagnosis not present

## 2020-03-08 DIAGNOSIS — G4700 Insomnia, unspecified: Secondary | ICD-10-CM | POA: Diagnosis not present

## 2020-03-08 DIAGNOSIS — E1140 Type 2 diabetes mellitus with diabetic neuropathy, unspecified: Secondary | ICD-10-CM | POA: Diagnosis not present

## 2020-03-08 DIAGNOSIS — D631 Anemia in chronic kidney disease: Secondary | ICD-10-CM | POA: Diagnosis not present

## 2020-03-08 DIAGNOSIS — M47812 Spondylosis without myelopathy or radiculopathy, cervical region: Secondary | ICD-10-CM | POA: Diagnosis not present

## 2020-03-08 DIAGNOSIS — D630 Anemia in neoplastic disease: Secondary | ICD-10-CM | POA: Diagnosis not present

## 2020-03-08 DIAGNOSIS — I130 Hypertensive heart and chronic kidney disease with heart failure and stage 1 through stage 4 chronic kidney disease, or unspecified chronic kidney disease: Secondary | ICD-10-CM | POA: Diagnosis not present

## 2020-03-08 DIAGNOSIS — N1832 Chronic kidney disease, stage 3b: Secondary | ICD-10-CM | POA: Diagnosis not present

## 2020-03-08 DIAGNOSIS — E1122 Type 2 diabetes mellitus with diabetic chronic kidney disease: Secondary | ICD-10-CM | POA: Diagnosis not present

## 2020-03-08 DIAGNOSIS — F419 Anxiety disorder, unspecified: Secondary | ICD-10-CM | POA: Diagnosis not present

## 2020-03-08 DIAGNOSIS — F32A Depression, unspecified: Secondary | ICD-10-CM | POA: Diagnosis not present

## 2020-03-08 DIAGNOSIS — M170 Bilateral primary osteoarthritis of knee: Secondary | ICD-10-CM | POA: Diagnosis not present

## 2020-03-08 DIAGNOSIS — G8929 Other chronic pain: Secondary | ICD-10-CM | POA: Diagnosis not present

## 2020-03-08 DIAGNOSIS — I214 Non-ST elevation (NSTEMI) myocardial infarction: Secondary | ICD-10-CM | POA: Diagnosis not present

## 2020-03-08 DIAGNOSIS — F0390 Unspecified dementia without behavioral disturbance: Secondary | ICD-10-CM | POA: Diagnosis not present

## 2020-03-08 DIAGNOSIS — I4891 Unspecified atrial fibrillation: Secondary | ICD-10-CM | POA: Diagnosis not present

## 2020-03-08 DIAGNOSIS — E785 Hyperlipidemia, unspecified: Secondary | ICD-10-CM | POA: Diagnosis not present

## 2020-03-08 DIAGNOSIS — I2510 Atherosclerotic heart disease of native coronary artery without angina pectoris: Secondary | ICD-10-CM | POA: Diagnosis not present

## 2020-03-08 DIAGNOSIS — Z48812 Encounter for surgical aftercare following surgery on the circulatory system: Secondary | ICD-10-CM | POA: Diagnosis not present

## 2020-03-08 DIAGNOSIS — C50919 Malignant neoplasm of unspecified site of unspecified female breast: Secondary | ICD-10-CM | POA: Diagnosis not present

## 2020-03-08 DIAGNOSIS — M1030 Gout due to renal impairment, unspecified site: Secondary | ICD-10-CM | POA: Diagnosis not present

## 2020-03-08 DIAGNOSIS — D509 Iron deficiency anemia, unspecified: Secondary | ICD-10-CM | POA: Diagnosis not present

## 2020-03-08 DIAGNOSIS — J449 Chronic obstructive pulmonary disease, unspecified: Secondary | ICD-10-CM | POA: Diagnosis not present

## 2020-03-08 DIAGNOSIS — I509 Heart failure, unspecified: Secondary | ICD-10-CM | POA: Diagnosis not present

## 2020-03-08 DIAGNOSIS — I2584 Coronary atherosclerosis due to calcified coronary lesion: Secondary | ICD-10-CM | POA: Diagnosis not present

## 2020-03-10 ENCOUNTER — Telehealth: Payer: Self-pay | Admitting: *Deleted

## 2020-03-10 NOTE — Telephone Encounter (Signed)
Received fax from [facility] [phone] Requesting Diabetic Shoes and Inserts.  Placed form in [facility] folder to review and sign. To be faxed back to [employer] once completed.

## 2020-03-15 ENCOUNTER — Other Ambulatory Visit: Payer: Self-pay | Admitting: Nurse Practitioner

## 2020-03-15 DIAGNOSIS — IMO0002 Reserved for concepts with insufficient information to code with codable children: Secondary | ICD-10-CM

## 2020-03-15 DIAGNOSIS — E1121 Type 2 diabetes mellitus with diabetic nephropathy: Secondary | ICD-10-CM

## 2020-03-15 DIAGNOSIS — Z794 Long term (current) use of insulin: Secondary | ICD-10-CM

## 2020-03-15 DIAGNOSIS — E1140 Type 2 diabetes mellitus with diabetic neuropathy, unspecified: Secondary | ICD-10-CM

## 2020-03-15 NOTE — Telephone Encounter (Signed)
Patient is under the care of endocrinologist

## 2020-03-16 NOTE — Progress Notes (Deleted)
CARDIOLOGY OFFICE NOTE  Date:  03/16/2020    [name] Date of Birth: 04/22/1935 Medical Record #[id number]  PCP:  [name], NP  Cardiologist:  Nancy Fetter chief complaint on file.   History of Present Illness: [name] is a 85 y.o. female who presents today for a follow up/post hospital visit. Seen for Dr. Meda Coffee.   She has a history of dementia, known CAD (BMS to RCA 2004, DESx2 to mid and proximal RCA 2012, recent NSTEMI 02/2020 s/p DES to RCA), chronic diastolic CHF, DM, morbid obesity, remote gastric bypass and reversal, anemia, PMR, HLD, depression, OCD, anxiety, OA, COPD, prior PE/DVT, CKD stage III, and breast CA.   She was admitted last month with chest pain/NSTEMI and underwent cath showing multivessel CAD but severe ISR in pRCA and m/dRCA lesion both treated with DES. There was diffuse mild to moderate disease in the LM as well as Lcx and LAD but nonobstructive and treated medically. LVEF was normal, borderline elevated LVEDP. 2D echo was technically difficult given habitus. Hospital course also notable for anemia requiring blood transfusion (has prior h/o anemia). IM discussed case with Dr. Irene Limbo, her oncologist, regarding ongoing maintenance Xarelto for prior h/o PE/DVT and the mutual decision was made to hold further Xarelto given need for DAPT and history of anemia.   She was seen about a month ago by Melina Copa, PA - daughter was present. Seemed to be doing ok with some chronic SOB - better than her prior baseline. Noted some endorsement of sharp chest pain. Remained off her Xarelto - d-dimer was checked and elevated - ended up in the ER the following day - low probability of PE per VQ scan.   Comes in today. Here with   Past Medical History:  Diagnosis Date  . Allergy    takes Mucinex daily as needed  . Anemia, unspecified   . Anxiety    takes Clonazepam daily as needed  . Arthritis   . Breast cancer ([place])   . Chronic diastolic CHF  (congestive heart failure) (HCC)    takes Furosemide daily  . CKD (chronic kidney disease), stage III ([place])   . Coronary artery disease    a. s/p IMI 2004 tx with BMS to RCA;  b. s/p Promus DES to RCA 2/12 c. abnormal nuc 2016 -> cath with med rx. d. NSTEMI 02/2020  mv CAD with severe ISR in pRCA and m/dRCA lesion both treated with DES.  . Dementia ([place])   . Depression    takes Cymbalta daily  . Diabetes mellitus    insulin daily  . GERD (gastroesophageal reflux disease)    takes Protonix daily  . Gout, unspecified   . Headache    occasionally  . History of blood clots   . Hyperlipidemia    takes Pravastatin daily  . Hypertension   . Insomnia   . Joint pain   . Joint swelling   . Morbid obesity ([place])   . Myocardial infarction ([place]) 2004  . Numbness   . Obstructive sleep apnea    does not wear cpap  . Osteoarthritis   . Osteoarthritis   . Osteoarthrosis, unspecified whether generalized or localized, lower leg   . Pain, chronic   . Polymyalgia rheumatica ([place])   . Pulmonary emboli ([place]) 01/2012   felt to need lifelong anticoagulation but Xarelto dc in 02/2020 due to need for DAPT  . Urinary incontinence    takes Linzess daily  .  Vertigo    hx of;was taking Meclizine if needed  . Wears dentures     Past Surgical History:  Procedure Laterality Date  . ABDOMINAL HYSTERECTOMY     partial  . APPENDECTOMY    . blood clots/legs and lungs  2013  . BREAST BIOPSY Left 07/22/2014  . BREAST BIOPSY Left 02/10/2013  . BREAST LUMPECTOMY Left 11/05/2014  . BREAST LUMPECTOMY WITH RADIOACTIVE SEED LOCALIZATION Left 11/05/2014   Procedure: LEFT BREAST LUMPECTOMY WITH RADIOACTIVE SEED LOCALIZATION;  Surgeon: [name], MD;  Location: [facility];  Service: General;  Laterality: Left;  . CARDIAC CATHETERIZATION    . COLONOSCOPY    . CORONARY ANGIOPLASTY  2  . CORONARY STENT INTERVENTION N/A 02/17/2020   Procedure: CORONARY STENT INTERVENTION;  Surgeon: Leonie Man, MD;   Location: [facility];  Service: Cardiovascular;  Laterality: N/A;  . ESOPHAGOGASTRODUODENOSCOPY (EGD) WITH PROPOFOL N/A 11/07/2016   Procedure: ESOPHAGOGASTRODUODENOSCOPY (EGD) WITH PROPOFOL;  Surgeon: Gatha Mayer, MD;  Location: [facility];  Service: Endoscopy;  Laterality: N/A;  . EXCISION OF SKIN TAG Right 11/05/2014   Procedure: EXCISION OF RIGHT EYELID SKIN TAG;  Surgeon: [name], MD;  Location: [facility];  Service: General;  Laterality: Right;  . EYE SURGERY Bilateral    cataract   . GASTRIC BYPASS  1977    reversed in 1979, [facility] CATH AND CORONARY ANGIOGRAPHY N/A 02/17/2020   Procedure: LEFT HEART CATH AND CORONARY ANGIOGRAPHY;  Surgeon: Leonie Man, MD;  Location: [facility];  Service: Cardiovascular;  Laterality: N/A;  . LEFT HEART CATHETERIZATION WITH CORONARY ANGIOGRAM N/A 06/29/2014   Procedure: LEFT HEART CATHETERIZATION WITH CORONARY ANGIOGRAM;  Surgeon: Troy Sine, MD;  Location: [facility];  Service: Cardiovascular;  Laterality: N/A;  . MEMBRANE PEEL Right 10/23/2018   Procedure: MEMBRANE PEEL;  Surgeon: Hurman Horn, MD;  Location: [facility];  Service: Ophthalmology;  Laterality: Right;  . MI with stent placement  2004  . PARS PLANA VITRECTOMY Right 10/23/2018   Procedure: PARS PLANA VITRECTOMY WITH 25 GAUGE;  Surgeon: Hurman Horn, MD;  Location: [facility];  Service: Ophthalmology;  Laterality: Right;     Medications: No outpatient medications have been marked as taking for the 03/30/20 encounter (Appointment) with Burtis Junes, NP.     Allergies: Allergies  Allergen Reactions  . Sulfonamide Derivatives Swelling    Mouth swelling  . Aricept [Donepezil Hcl] Diarrhea and Nausea And Vomiting    GI upset/loose stools  . Tramadol Nausea And Vomiting    Social History: The patient  reports that she has never smoked. She has never used smokeless tobacco. She reports that she does not drink alcohol and does not use  drugs.   Family History: [name] ***family history includes Arthritis in her father and sister; Breast cancer (age of onset: 26) in her mother; Diabetes in her father and sister; Heart disease in her cousin and mother; Hypertension in her father; Obesity in her sister; Throat cancer in her father.   Review of Systems: Please see the history of present illness.   All other systems are reviewed and negative.   Physical Exam: VS:  There were no vitals taken for this visit. [name]  BMI There is no height or weight on file to calculate BMI.  Wt Readings from Last 3 Encounters:  03/03/20 (!) 338 lb (153.3 kg)  03/02/20 (!) 338 lb (153.3 kg)  02/23/20 (!) 338 lb 1.6  oz (153.4 kg)    General: Pleasant. Well developed, well nourished and in no acute distress.   HEENT: Normal.  Neck: Supple, no JVD, carotid bruits, or masses noted.  Cardiac: ***Regular rate and rhythm. No murmurs, rubs, or gallops. No edema.  Respiratory:  Lungs are clear to auscultation bilaterally with normal work of breathing.  GI: Soft and nontender.  MS: No deformity or atrophy. Gait and ROM intact.  Skin: Warm and dry. Color is normal.  Neuro:  Strength and sensation are intact and no gross focal deficits noted.  Psych: Alert, appropriate and with normal affect.   LABORATORY DATA:  EKG:  EKG {ACTION; IS/IS [id number]} ordered today.  Personally reviewed by me. This demonstrates ***.  Lab Results  Component Value Date   WBC 11.3 (H) 03/03/2020   HGB 9.3 (L) 03/03/2020   HCT 32.5 (L) 03/03/2020   PLT 283 03/03/2020   GLUCOSE 254 (H) 03/03/2020   CHOL 117 02/15/2020   TRIG 85 02/15/2020   HDL 40 (L) 02/15/2020   LDLCALC 60 02/15/2020   ALT 6 02/23/2020   AST 10 (L) 02/23/2020   NA 138 03/03/2020   K 4.0 03/03/2020   CL 104 03/03/2020   CREATININE 1.98 (H) 03/03/2020   BUN 32 (H) 03/03/2020   CO2 23 03/03/2020   TSH 2.370 03/02/2020   INR 1.2 10/23/2018   HGBA1C 8.9 (A) 12/23/2019   MICROALBUR 2.9  06/14/2017     BNP (last 3 results) No results for input(s): BNP in the last 8760 hours.  ProBNP (last 3 results) No results for input(s): PROBNP in the last 8760 hours.   Other Studies Reviewed Today:  CORONARY STENT INTERVENTION 02/2020  LEFT HEART CATH AND CORONARY ANGIOGRAPHY  Conclusion    LV end diastolic pressure is mildly elevated.  ------------------------------  Mid LM to Prox LAD lesion is 30% stenosed with 30% stenosed side branch in Ost Cx.  Prox LAD to Mid LAD lesion is 45% stenosed with 25% stenosed side branch in 1st Diag.  1st Mrg lesion is 100% stenosed.  ----------------  Colon Flattery RCA to Prox RCA stent is 10% stenosed.  Prox RCA to Mid RCA stent is 5% stenosed.  Prox RCA lesion is 95% stenosed, between the 2 old stents  A drug-eluting stent was successfully placed using a STENT RESOLUTE ONYX 4.0X18 -> postdilated to 4.6-4.7 mm  Post intervention, there is a 0% residual stenosis.  ----------------  Mid RCA lesion is 85% stenosed after the second stent  A drug-eluting stent was successfully placed using a STENT RESOLUTE ONYX 3.0X18 -> tapered post dilation from 4.2-3.3 mm  Post intervention, there is a 0% residual stenosis.   SUMMARY  Multivessel CAD with severe (95%) IntraStent stenosis in the proximal RCA and de novo stenosis distal to mid RCA stent (85%) -> both treated with DES PCI using Resolute Onyx DES stents (distal-3.0 mm x 18 mm with tapered post dilation 4.2-3.3 mm; proximal 4.0 mm 18 mm postdilated to 4.7 mm.)  Diffuse mild to moderate calcified disease in the distal Left Main as well as ostial and proximal LCx and LAD with no severe stenoses.  Borderline elevated LVEDP with severe systemic hypertension   RECOMMENDATIONS  Return to nursing unit for ongoing care.  Okay to DC heparin.  We will need to determine plus or minus the use of Xarelto plus Plavix.  For now would continue aspirin plus Plavix until discharge and then  discontinue aspirin on discharge with Xarelto plus Plavix for 3-6  months.  Continue to monitor anemia and other risk factors.   Discussed with Dr. Angelena Form.  ECHO IMPRESSIONS 02/2020  1. Technically difficult echo with poor image quality.  2. Left ventricular ejection fraction, by estimation, is 65 to 70%. The  left ventricle has normal function. The left ventricle has no regional  wall motion abnormalities. Left ventricular diastolic parameters are  consistent with Grade I diastolic  dysfunction (impaired relaxation).  3. Right ventricular systolic function was not well visualized. The right  ventricular size is not well visualized.  4. The mitral valve was not well visualized. No evidence of mitral valve  regurgitation.  5. The aortic valve was not well visualized. Aortic valve regurgitation  is not visualized.     ASSESSMENT & PLAN:   Chest pain/NSTEMI -Patient presented with left-sided chest pain which had both typical and atypical features.  She did have some pleuritic component of chest pain increased with deep breathing and was tender to touch. -She was started on heparin drip as well as Voltaren gel -Chest x-ray was unremarkable -Cardiology consulted and appreciated, status post cardiac catheterization: Multivessel CAD with severe 95% IntraStent stenosis in the proximal RCA and no flow stenosis distal to mid RCA stent 85%, both treated with DES PCI -Echocardiogram EF 65 to 93%, grade 1 diastolic dysfunction -Plan for patient to be discharged with aspirin and Plavix, statin, metoprolol, Imdur -Patient to follow-up as an outpatient with cardiology  Diabetes mellitus, type II with neuropathy -Hemoglobin A1c 8.9 in August 2021 -Continue Lantus and novolog  History of DVT and PE -Xarelto was held -Given her age, and current anemia, would favor holding Xarelto. -Discussed with Dr. Irene Limbo, oncologist, who states that patient was on Xarelto when she started seeing  him.  Patient has had issues with anemia in the past.  She is currently on a maintenance dose of Xarelto.  Agreed to holding Xarelto for now and to continue on with aspirin and Plavix.  History of gastric bypass -Stable  Morbid obesity -BMI 55.29 -Patient to follow-up with PCP to discuss lifestyle modifications  Essential hypertension -Continue hydralazine, metoprolol  Hyperlipidemia -Continue statin  Leukocytosis -Chest x-ray unremarkable for infection -Covid and influenza unremarkable  -Patient denied any urinary complaints -Likely secondary to the above -WBC trending downward  Gout -Continue for febuxostat  Dementia -Continue Namenda  Chronic anemia -Hemoglobin appears to be between 8 and 9 at baseline -Hemoglobin had dropped to 7.7, and she was transfused 1 unit of PRBC -Currently 8.6 -Repeat CBC in 1 week  Chronic kidney disease, stage IIIb -Appears to be stable  Breast cancer -On Arimidex -Follow-up with Dr. Irene Limbo, oncology   Current medicines are reviewed with the patient today.  The patient does not have concerns regarding medicines other than what has been noted above.  The following changes have been made:  See above.  Labs/ tests ordered today include:   No orders of the defined types were placed in this encounter.    Disposition:   FU with *** in {gen number [id number]} {Days to years:10300}.   Patient is agreeable to this plan and will call if any problems develop in the interim.   SignedTruitt Merle, NP  03/16/2020 7:29 AM  [facility] [address], [place]  [address] Phone: [phone] Fax: [phone]

## 2020-03-18 ENCOUNTER — Other Ambulatory Visit: Payer: Self-pay

## 2020-03-18 ENCOUNTER — Ambulatory Visit
Payer: PPO | Attending: Student in an Organized Health Care Education/Training Program | Admitting: Student in an Organized Health Care Education/Training Program

## 2020-03-18 ENCOUNTER — Encounter: Payer: Self-pay | Admitting: Student in an Organized Health Care Education/Training Program

## 2020-03-18 VITALS — BP 138/50 | HR 83 | Temp 97.6°F | Resp 18 | Ht 67.0 in | Wt 338.0 lb

## 2020-03-18 DIAGNOSIS — M5416 Radiculopathy, lumbar region: Secondary | ICD-10-CM | POA: Insufficient documentation

## 2020-03-18 DIAGNOSIS — M48062 Spinal stenosis, lumbar region with neurogenic claudication: Secondary | ICD-10-CM | POA: Diagnosis not present

## 2020-03-18 DIAGNOSIS — G8929 Other chronic pain: Secondary | ICD-10-CM | POA: Diagnosis not present

## 2020-03-18 DIAGNOSIS — M47816 Spondylosis without myelopathy or radiculopathy, lumbar region: Secondary | ICD-10-CM | POA: Diagnosis not present

## 2020-03-18 DIAGNOSIS — G894 Chronic pain syndrome: Secondary | ICD-10-CM

## 2020-03-18 DIAGNOSIS — M170 Bilateral primary osteoarthritis of knee: Secondary | ICD-10-CM | POA: Diagnosis not present

## 2020-03-18 NOTE — Progress Notes (Signed)
Safety precautions to be maintained throughout the outpatient stay will include: orient to surroundings, keep bed in low position, maintain call bell within reach at all times, provide assistance with transfer out of bed and ambulation.  

## 2020-03-18 NOTE — Progress Notes (Signed)
Patient: [name]  Service Category: E/M  Provider: Gillis Santa, MD  DOB: January 16, 1935  DOS: 03/18/2020  Referring Provider: [name], NP  MRN: [id number]  Setting: Ambulatory outpatient  PCP: [name], NP  Type: New Patient  Specialty: Interventional Pain Management    Location: Office  Delivery: Face-to-face     Primary Reason(s) for Visit: Encounter for initial evaluation of one or more chronic problems (new to examiner) potentially causing chronic pain, and posing a threat to normal musculoskeletal function. (Level of risk: High) CC: Knee Pain (bilat), Hand Pain (bilat), Hip Pain (bilat), Back Pain (lower), and Generalized Body Aches (LEFT side of body)  HPI  [name] is a 85 y.o. year old, female patient, who comes for the first time to our practice referred by [name], NP for our initial evaluation of her chronic pain. She has Fungal dermatitis; Hyperlipidemia; Morbid (severe) obesity due to excess calories ([place]); Microcytic anemia; TENSION HEADACHE; Situational depression; Essential hypertension; CAD (coronary artery disease); Headache; ORTHOPNEA; URINARY INCONTINENCE; Personal history of other diseases of the digestive system; Abdominal pain; History of deep venous thrombosis; Chronic diastolic congestive heart failure ([place]); Long term current use of anticoagulant therapy; Dysphagia, unspecified(787.20); GERD (gastroesophageal reflux disease); Debility; Osteoarthritis; Insomnia; Need for prophylactic vaccination and inoculation against influenza; OSA (obstructive sleep apnea); Type 2 diabetes mellitus with diabetic foot deformity ([place]); Pain in the chest; Estrogen deficiency; Breast cancer screening; Weight gain; COPD (chronic obstructive pulmonary disease) ([place]); History of pulmonary embolus (PE); CN (constipation); DOE (dyspnea on exertion); Chronic bronchitis ([place]); Vertigo; Encounter for therapeutic drug monitoring; Gout; Breast mass; Morbid obesity with BMI  of 50.0-59.9, adult ([place]); CKD (chronic kidney disease) stage 3, GFR 30-59 ml/min ([place]); Dyslipidemia associated with type 2 diabetes mellitus ([place]); Type 2 diabetes mellitus with stage 3b chronic kidney disease, with long-term current use of insulin ([place]); Chronic pain syndrome; Breast cancer ([place]); Vitamin D deficiency; Cough variant asthma; Severe episode of recurrent major depressive disorder, without psychotic features ([place]); Endometrial polyp; Vaginitis and vulvovaginitis; Esophageal dysphagia; GERD with esophagitis; Memory loss; Multiple benign nevi; Anxiety associated with depression; Hyperkalemia; Type 2 diabetes mellitus with diabetic neuropathy, with long-term current use of insulin ([place]); Type 2 diabetes mellitus with hyperglycemia, with long-term current use of insulin ([place]); Pulmonary embolus ([place]); Moderate nonproliferative diabetic retinopathy of both eyes without macular edema associated with type 2 diabetes mellitus ([place]); Chest pain on exertion; Non-STEMI (non-ST elevated myocardial infarction) ([place]); Presence of stent in right coronary artery; Chronic radicular lumbar pain; Lumbar spondylosis; and Spinal stenosis, lumbar region, with neurogenic claudication on their problem list. Today she comes in for evaluation of her Knee Pain (bilat), Hand Pain (bilat), Hip Pain (bilat), Back Pain (lower), and Generalized Body Aches (LEFT side of body)  Pain Assessment: Location: Right, Left Knee Radiating: denies Onset: More than a month ago Duration: Chronic pain Quality: Shooting, Aching, Constant Severity: 9 /10 (subjective, self-reported pain score)  Effect on ADL: limits daily activities Timing: Constant Modifying factors: nothing helping BP: (!) 138/50  HR: 83  Onset and Duration: Present longer than 3 months Cause of pain: Unknown Severity: Getting worse and NAS-11 at its worse: 10/10 Timing: Night and After activity or exercise Aggravating Factors: Prolonged sitting Alleviating  Factors: Standing Associated Problems: Constipation Quality of Pain: Constant Previous Examinations or Tests: The patient denies na Previous Treatments: Radiofrequency and The patient denies na  [name] is a pleasant 85 year old female with history of morbid obesity, significant cardiac history, type  2 diabetes with neuropathic pain without presents with chronic pain of multiple regions.  She endorses neck pain, shoulder pain, bilateral hip pain, bilateral knee pain as well as low back pain.  Patient is accompanied today by her daughter.  Patient lives with her daughter.  Patient is not very functional.  She essentially goes from the bed at night to the recliner during the day.  She does not ambulate very much.  Her current BMI is 53.  She weighs 338 pounds.  She has tried to do physical therapy in the past but it was not helpful for her pain.  She has a history of recent MI with percutaneous coronary intervention with stent placement.  She is on dual antiplatelet therapy with aspirin and Plavix.  Patient was recently started on Lyrica 25 mg twice a day last month.  This was started by her primary care provider.  Do not recommend NSAID therapy given her intake of aspirin and Plavix.  Patient also has chronic kidney disease.  She is on Cymbalta 60 mg daily as well.  She is currently not on any opioid analgesics.  She states that she did take oxycodone in the past which did help out with her pain.  Historic Controlled Substance Pharmacotherapy Review  Historical Monitoring: The patient  reports no history of drug use. List of all UDS Test(s): Lab Results  Component Value Date   COCAINSCRNUR negative 03/31/2019   List of other Serum/Urine Drug Screening Test(s):  Lab Results  Component Value Date   COCAINSCRNUR negative 03/31/2019   Historical Background Evaluation: [place] PMP: PDMP reviewed during this encounter. Online review of the past 88-monthperiod conducted.             [place] Department of  public safety, offender search: (Editor, commissioningInformation) Non-contributory Risk Assessment Profile: Aberrant behavior: None observed or detected today Risk factors for fatal opioid overdose: Morbid obesity, symptoms concerning for obstructive sleep apnea, chronic kidney disease Fatal overdose hazard ratio (HR): Calculation deferred Non-fatal overdose hazard ratio (HR): Calculation deferred Risk of opioid abuse or dependence: 0.7-3.0% with doses ? 36 MME/day and 6.1-26% with doses ? 120 MME/day. Substance use disorder (SUD) risk level: See below Personal History of Substance Abuse (SUD-Substance use disorder):  Alcohol: Negative  Illegal Drugs: Negative  Rx Drugs: Negative  ORT Risk Level calculation: Low Risk  Opioid Risk Tool - 03/18/20 1108      Family History of Substance Abuse   Alcohol Negative    Illegal Drugs Negative    Rx Drugs Negative      Personal History of Substance Abuse   Alcohol Negative    Illegal Drugs Negative    Rx Drugs Negative      Age   Age between 171-45years  No      History of Preadolescent Sexual Abuse   History of Preadolescent Sexual Abuse Negative or Female      Psychological Disease   Psychological Disease Negative    Depression Positive      Total Score   Opioid Risk Tool Scoring 1    Opioid Risk Interpretation Low Risk          ORT Scoring interpretation table:  Score <3 = Low Risk for SUD  Score between 4-7 = Moderate Risk for SUD  Score >8 = High Risk for Opioid Abuse   PHQ-2 Depression Scale:  Total score:    PHQ-2 Scoring interpretation table: (Score and probability of major depressive disorder)  Score 0 = No  depression  Score 1 = 15.4% Probability  Score 2 = 21.1% Probability  Score 3 = 38.4% Probability  Score 4 = 45.5% Probability  Score 5 = 56.4% Probability  Score 6 = 78.6% Probability   PHQ-9 Depression Scale:  Total score:    PHQ-9 Scoring interpretation table:  Score 0-4 = No depression  Score 5-9 = Mild depression   Score 10-14 = Moderate depression  Score 15-19 = Moderately severe depression  Score 20-27 = Severe depression (2.4 times higher risk of SUD and 2.89 times higher risk of overuse)   Pharmacologic Plan: As per protocol, I have not taken over any controlled substance management, pending the results of ordered tests and/or consults.            Initial impression: Pending review of available data and ordered tests.  Meds   Current Outpatient Medications:  .  acetaminophen (TYLENOL) 500 MG tablet, Take 1,000 mg by mouth daily., Disp: , Rfl:  .  albuterol (PROAIR HFA) 108 (90 Base) MCG/ACT inhaler, Inhale 1-2 puffs into the lungs every 6 (six) hours as needed for wheezing or shortness of breath., Disp: 6.7 g, Rfl: 1 .  anastrozole (ARIMIDEX) 1 MG tablet, TAKE 1 TABLET BY MOUTH EVERY DAY, Disp: 90 tablet, Rfl: 1 .  aspirin EC 81 MG EC tablet, Take 1 tablet (81 mg total) by mouth daily. Swallow whole., Disp: 30 tablet, Rfl: 1 .  atorvastatin (LIPITOR) 80 MG tablet, Take 1 tablet (80 mg total) by mouth daily., Disp: 30 tablet, Rfl: 1 .  budesonide-formoterol (SYMBICORT) 160-4.5 MCG/ACT inhaler, Inhale 2 puffs into the lungs 2 (two) times daily. For bronchitis, Disp: 1 Inhaler, Rfl: 3 .  cetirizine (ZYRTEC) 10 MG tablet, Take 1 tablet (10 mg total) by mouth daily., Disp: 30 tablet, Rfl: 11 .  clopidogrel (PLAVIX) 75 MG tablet, Take 1 tablet (75 mg total) by mouth daily with breakfast., Disp: 30 tablet, Rfl: 1 .  Continuous Blood Gluc Sensor (FREESTYLE LIBRE 14 DAY SENSOR) MISC, Inject 1 Device into the skin as directed., Disp: 6 each, Rfl: 3 .  Cyanocobalamin (VITAMIN B-12 PO), Take 1 tablet by mouth every evening., Disp: , Rfl:  .  DULoxetine (CYMBALTA) 60 MG capsule, TAKE 1 CAPSULE BY MOUTH EVERY DAY FOR ANXIETY, Disp: 90 capsule, Rfl: 1 .  ergocalciferol (VITAMIN D2) 1.25 MG (50000 UT) capsule, Take 1 capsule (50,000 Units total) by mouth once a week., Disp: 12 capsule, Rfl: 3 .  febuxostat (ULORIC)  40 MG tablet, TAKE 1 TABLET BY MOUTH EVERY DAY, Disp: 90 tablet, Rfl: 1 .  fluticasone (FLONASE) 50 MCG/ACT nasal spray, Place 1 spray into both nostrils in the morning and at bedtime., Disp: 11.1 mL, Rfl: 3 .  furosemide (LASIX) 20 MG tablet, TAKE 1 TABLET BY MOUTH EVERY DAY AS NEEDED, Disp: 30 tablet, Rfl: 6 .  hydrALAZINE (APRESOLINE) 50 MG tablet, Take 1 tablet (50 mg total) by mouth 2 (two) times daily., Disp: 60 tablet, Rfl: 3 .  hydrOXYzine (ATARAX/VISTARIL) 10 MG tablet, Take 1 tablet by mouth three times daily as needed, Disp: 90 tablet, Rfl: 1 .  insulin aspart (NOVOLOG FLEXPEN) 100 UNIT/ML FlexPen, Inject 16 Units into the skin 3 (three) times daily with meals. Max daily dose 70 units, Disp: 63 mL, Rfl: 3 .  insulin glargine, 2 Unit Dial, (TOUJEO MAX SOLOSTAR) 300 UNIT/ML Solostar Pen, Inject 38 Units into the skin daily. Patient assistance program provides, Disp: 15 mL, Rfl: 6 .  isosorbide mononitrate (IMDUR) 60  MG 24 hr tablet, TAKE 1 TABLET BY MOUTH EVERY DAY, Disp: 90 tablet, Rfl: 1 .  meclizine (ANTIVERT) 25 MG tablet, TAKE 1 TABLET BY MOUTH THREE TIMES DAILY AS NEEDED FOR DIZZINESS, Disp: 90 tablet, Rfl: 1 .  memantine (NAMENDA) 5 MG tablet, TAKE 2 TABLETS BY MOUTH EVERY MORNING and TAKE 1 TABLET EVERY EVENING FOR memory, Disp: 90 tablet, Rfl: 5 .  metoprolol succinate (TOPROL-XL) 50 MG 24 hr tablet, Take 50 mg by mouth daily., Disp: , Rfl:  .  nitroGLYCERIN (NITROSTAT) 0.4 MG SL tablet, Take one tablet under the tongue every 5 minutes as needed for chest pain, Disp: 50 tablet, Rfl: 0 .  pantoprazole (PROTONIX) 40 MG tablet, TAKE 1 TABLET BY MOUTH EVERY DAY, Disp: 90 tablet, Rfl: 1 .  pregabalin (LYRICA) 25 MG capsule, Take 1 capsule (25 mg total) by mouth 2 (two) times daily., Disp: 60 capsule, Rfl: 3 .  sodium polystyrene (SPS) 15 GM/60ML suspension, TAKE 15 grams (60 mls) BY MOUTH ONCE WEEKLY TO keep potassium down, Disp: 473 mL, Rfl: 2 .  topiramate (TOPAMAX) 25 MG tablet, Take 1  tablet (25 mg total) by mouth daily., Disp: 90 tablet, Rfl: 1  Imaging Review   DG Cervical Spine Complete  Narrative CLINICAL DATA:  Left-sided neck and shoulder pain for 1 year, no known injury, initial encounter  EXAM: CERVICAL SPINE - COMPLETE 4+ VIEW  COMPARISON:  None.  FINDINGS: Seven cervical segments are visualized. No compression deformity is seen. Mild osteophytic changes are noted. Multilevel facet hypertrophic changes are seen. No prevertebral soft tissue abnormality is noted. Carotid calcifications are seen. The neural foramina are patent. No other focal abnormality is noted.  IMPRESSION: Multilevel degenerative change without acute abnormality.   Electronically Signed By: Inez Catalina M.D. On: 02/03/2020 08:08 MM LT RADIOACTIVE SEED LOC MAMMO GUIDE  Narrative CLINICAL DATA:  Preoperative radioactive seed localization after 2 left breast biopsies demonstrating duct ectasia. Per previous discussion with Dr. Ninfa Linden, this seed is to be placed between the 2 previously placed left retroareolar clips.  EXAM: MAMMOGRAPHIC GUIDED RADIOACTIVE SEED LOCALIZATION OF THE left BREAST  COMPARISON:  Previous exam(s).  FINDINGS: Patient presents for radioactive seed localization prior to excisional biopsy. I met with the patient and we discussed the procedure of seed localization including benefits and alternatives. We discussed the high likelihood of a successful procedure. We discussed the risks of the procedure including infection, bleeding, tissue injury and further surgery. We discussed the low dose of radioactivity involved in the procedure. Informed, written consent was given.  The usual time-out protocol was performed immediately prior to the procedure.  Using mammographic guidance, sterile technique, 2% lidocaine and an I-125 radioactive seed, the space between 2 left breast retroareolar clips was localized using a superior to inferior approach.  The follow-up mammogram images confirm the seed in the expected location and were marked for Dr. Ninfa Linden.  Follow-up survey of the patient confirms presence of the radioactive seed.  Order number of I-125 seed:  [id number].  Total activity:  0.250 mCi  Reference Date: 10/23/2014  The patient tolerated the procedure well and was released from the [facility]. She was given instructions regarding seed removal.  IMPRESSION: Radioactive seed localization left breast. No apparent complications.   Electronically Signed By: Conchita Paris M.D. On: 11/04/2014 13:53  DG HIP UNILAT WITH PELVIS 2-3 VIEWS RIGHT  Narrative CLINICAL DATA:  Fall out of bed last night.  Right hip pain.  EXAM: DG HIP (WITH OR  WITHOUT PELVIS) 2-3V RIGHT  COMPARISON:  None.  FINDINGS: Moderate degenerative changes in the hips bilaterally with joint space narrowing and spurring. No acute bony abnormality. Specifically, no fracture, subluxation, or dislocation. Soft tissues are intact.  IMPRESSION: No acute bony abnormality.   Electronically Signed By: Rolm Baptise M.D. On: 03/20/2016 12:50  Narrative CLINICAL DATA:  [place] out of bed last night.  Right leg pain.  EXAM: RIGHT KNEE - COMPLETE 4+ VIEW  COMPARISON:  None.  FINDINGS: Advanced degenerative changes in the right knee, most pronounced in the medial and patellofemoral compartments with joint space loss and spurring. No acute bony abnormality. Specifically, no fracture, subluxation, or dislocation. Soft tissues are intact. No joint effusion.  IMPRESSION: Advanced tricompartment degenerative changes. No acute bony abnormality.   Electronically Signed By: Rolm Baptise M.D. On: 03/20/2016 12:50   Complexity Note: Imaging results reviewed. Results shared with Ms. Riedesel, using Layman's terms.                         ROS  Cardiovascular: Heart trouble Pulmonary or Respiratory: Lung problems Neurological: No reported  neurological signs or symptoms such as seizures, abnormal skin sensations, urinary and/or fecal incontinence, being born with an abnormal open spine and/or a tethered spinal cord Psychological-Psychiatric: No reported psychological or psychiatric signs or symptoms such as difficulty sleeping, anxiety, depression, delusions or hallucinations (schizophrenial), mood swings (bipolar disorders) or suicidal ideations or attempts Gastrointestinal: No reported gastrointestinal signs or symptoms such as vomiting or evacuating blood, reflux, heartburn, alternating episodes of diarrhea and constipation, inflamed or scarred liver, or pancreas or irrregular and/or infrequent bowel movements Genitourinary: Kidney disease Hematological: No reported hematological signs or symptoms such as prolonged bleeding, low or poor functioning platelets, bruising or bleeding easily, hereditary bleeding problems, low energy levels due to low hemoglobin or being anemic Endocrine: No reported endocrine signs or symptoms such as high or low blood sugar, rapid heart rate due to high thyroid levels, obesity or weight gain due to slow thyroid or thyroid disease Rheumatologic: Joint aches and or swelling due to excess weight (Osteoarthritis) Musculoskeletal: Negative for myasthenia gravis, muscular dystrophy, multiple sclerosis or malignant hyperthermia Work History: Unemployed  Allergies  Ms. Birkland is allergic to sulfonamide derivatives, aricept [donepezil hcl], and tramadol.  Laboratory Chemistry Profile   Renal Lab Results  Component Value Date   BUN 32 (H) 03/03/2020   CREATININE 1.98 (H) 03/03/2020   LABCREA 36 06/14/2017   BCR 20 03/02/2020   GFR 49.91 (L) 07/31/2012   GFRAA 38 (L) 03/02/2020   GFRNONAA 25 (L) 03/03/2020   SPECGRAV 1.015 06/03/2019   PHUR 6.0 06/03/2019   PROTEINUR Negative 06/03/2019     Electrolytes Lab Results  Component Value Date   NA 138 03/03/2020   K 4.0 03/03/2020   CL 104 03/03/2020    CALCIUM 8.6 (L) 03/03/2020   MG 1.4 (L) 10/24/2017   PHOS 3.7 10/09/2017     Hepatic Lab Results  Component Value Date   AST 10 (L) 02/23/2020   ALT 6 02/23/2020   ALBUMIN 2.9 (L) 02/23/2020   ALKPHOS 84 02/23/2020   LIPASE 39 08/27/2018     ID Lab Results  Component Value Date   SARSCOV2NAA NEGATIVE 02/14/2020   MRSAPCR NEGATIVE 04/21/2014     Bone Lab Results  Component Value Date   VD25OH 21.76 (L) 07/24/2019     Endocrine Lab Results  Component Value Date   GLUCOSE 254 (H) 03/03/2020  GLUCOSEU >=500 (A) 01/29/2019   HGBA1C 8.9 (A) 12/23/2019   TSH 2.370 03/02/2020     Neuropathy Lab Results  Component Value Date   VITAMINB12 677 02/23/2020   FOLATE 6.7 10/09/2017   HGBA1C 8.9 (A) 12/23/2019     CNS No results found for: COLORCSF, APPEARCSF, RBCCOUNTCSF, WBCCSF, POLYSCSF, LYMPHSCSF, EOSCSF, PROTEINCSF, GLUCCSF, JCVIRUS, CSFOLI, IGGCSF, LABACHR, ACETBL, LABACHR, ACETBL   Inflammation (CRP: Acute  ESR: Chronic) Lab Results  Component Value Date   CRP 1.0 (H) 01/30/2019   ESRSEDRATE 110 (H) 09/25/2014   LATICACIDVEN 1.5 01/25/2019     Rheumatology Lab Results  Component Value Date   LABURIC 8.6 (H) 12/03/2014     Coagulation Lab Results  Component Value Date   INR 1.2 10/23/2018   LABPROT 15.4 (H) 10/23/2018   APTT 125 (H) 02/16/2020   PLT 283 03/03/2020   DDIMER 5.49 (H) 03/02/2020     Cardiovascular Lab Results  Component Value Date   BNP 21.0 01/25/2019   CKTOTAL 167 07/28/2017   CKMB 8.2 (HH) 01/26/2012   TROPONINI <0.03 08/27/2018   HGB 9.3 (L) 03/03/2020   HCT 32.5 (L) 03/03/2020     Screening Lab Results  Component Value Date   SARSCOV2NAA NEGATIVE 02/14/2020   COVIDSOURCE NASOPHARYNGEAL 10/19/2018   MRSAPCR NEGATIVE 04/21/2014     Cancer No results found for: CEA, CA125, LABCA2   Allergens No results found for: ALMOND, APPLE, ASPARAGUS, AVOCADO, BANANA, BARLEY, BASIL, BAYLEAF, GREENBEAN, LIMABEAN, WHITEBEAN, BEEFIGE,  REDBEET, BLUEBERRY, BROCCOLI, CABBAGE, MELON, CARROT, CASEIN, CASHEWNUT, CAULIFLOWER, CELERY     Note: Lab results reviewed.  [place]  Drug: Ms. Gomm  reports no history of drug use. Alcohol:  reports no history of alcohol use. Tobacco:  reports that she has never smoked. She has never used smokeless tobacco. Medical:  has a past medical history of Allergy, Anemia, unspecified, Anxiety, Arthritis, Breast cancer ([place]), Chronic diastolic CHF (congestive heart failure) ([place]), CKD (chronic kidney disease), stage III ([place]), Coronary artery disease, Dementia ([place]), Depression, Diabetes mellitus, GERD (gastroesophageal reflux disease), Gout, unspecified, Headache, History of blood clots, Hyperlipidemia, Hypertension, Insomnia, Joint pain, Joint swelling, Morbid obesity ([place]), Myocardial infarction ([place]) (2004), Numbness, Obstructive sleep apnea, Osteoarthritis, Osteoarthritis, Osteoarthrosis, unspecified whether generalized or localized, lower leg, Pain, chronic, Polymyalgia rheumatica ([place]), Pulmonary emboli ([place]) (01/2012), Urinary incontinence, Vertigo, and Wears dentures. Family: family history includes Arthritis in her father and sister; Breast cancer (age of onset: 67) in her mother; Diabetes in her father and sister; Heart disease in her cousin and mother; Hypertension in her father; Obesity in her sister; Throat cancer in her father.  Past Surgical History:  Procedure Laterality Date  . ABDOMINAL HYSTERECTOMY     partial  . APPENDECTOMY    . blood clots/legs and lungs  2013  . BREAST BIOPSY Left 07/22/2014  . BREAST BIOPSY Left 02/10/2013  . BREAST LUMPECTOMY Left 11/05/2014  . BREAST LUMPECTOMY WITH RADIOACTIVE SEED LOCALIZATION Left 11/05/2014   Procedure: LEFT BREAST LUMPECTOMY WITH RADIOACTIVE SEED LOCALIZATION;  Surgeon: [name], MD;  Location: [facility];  Service: General;  Laterality: Left;  . CARDIAC CATHETERIZATION    . COLONOSCOPY    . CORONARY ANGIOPLASTY  2  . CORONARY STENT  INTERVENTION N/A 02/17/2020   Procedure: CORONARY STENT INTERVENTION;  Surgeon: Leonie Man, MD;  Location: [facility];  Service: Cardiovascular;  Laterality: N/A;  . ESOPHAGOGASTRODUODENOSCOPY (EGD) WITH PROPOFOL N/A 11/07/2016   Procedure: ESOPHAGOGASTRODUODENOSCOPY (EGD) WITH PROPOFOL;  Surgeon: Gatha Mayer, MD;  Location: [facility];  Service: Endoscopy;  Laterality: N/A;  . EXCISION OF SKIN TAG Right 11/05/2014   Procedure: EXCISION OF RIGHT EYELID SKIN TAG;  Surgeon: [name], MD;  Location: [facility];  Service: General;  Laterality: Right;  . EYE SURGERY Bilateral    cataract   . GASTRIC BYPASS  1977    reversed in 1979, [place] CATH AND CORONARY ANGIOGRAPHY N/A 02/17/2020   Procedure: LEFT HEART CATH AND CORONARY ANGIOGRAPHY;  Surgeon: Leonie Man, MD;  Location: [facility];  Service: Cardiovascular;  Laterality: N/A;  . LEFT HEART CATHETERIZATION WITH CORONARY ANGIOGRAM N/A 06/29/2014   Procedure: LEFT HEART CATHETERIZATION WITH CORONARY ANGIOGRAM;  Surgeon: Troy Sine, MD;  Location: [facility];  Service: Cardiovascular;  Laterality: N/A;  . MEMBRANE PEEL Right 10/23/2018   Procedure: MEMBRANE PEEL;  Surgeon: Hurman Horn, MD;  Location: [facility];  Service: Ophthalmology;  Laterality: Right;  . MI with stent placement  2004  . PARS PLANA VITRECTOMY Right 10/23/2018   Procedure: PARS PLANA VITRECTOMY WITH 25 GAUGE;  Surgeon: Hurman Horn, MD;  Location: [facility];  Service: Ophthalmology;  Laterality: Right;   Active Ambulatory Problems    Diagnosis Date Noted  . Fungal dermatitis 11/13/2007  . Hyperlipidemia 12/03/2006  . Morbid (severe) obesity due to excess calories ([place]) 02/15/2009  . Microcytic anemia 02/15/2009  . TENSION HEADACHE 01/07/2008  . Situational depression 06/04/2009  . Essential hypertension 12/03/2006  . CAD (coronary artery disease) 08/29/2007  . Headache 01/07/2008  . ORTHOPNEA 03/09/2009  . URINARY  INCONTINENCE 08/29/2007  . Personal history of other diseases of the digestive system 12/03/2006  . Abdominal pain 01/26/2012  . History of deep venous thrombosis 01/27/2012  . Chronic diastolic congestive heart failure ([place]) 01/29/2012  . Long term current use of anticoagulant therapy 02/02/2012  . Dysphagia, unspecified(787.20) 07/31/2012  . GERD (gastroesophageal reflux disease) 07/31/2012  . Debility 08/01/2012  . Osteoarthritis 08/01/2012  . Insomnia 09/17/2012  . Need for prophylactic vaccination and inoculation against influenza 01/08/2013  . OSA (obstructive sleep apnea) 02/24/2013  . Type 2 diabetes mellitus with diabetic foot deformity ([place]) 07/29/2013  . Pain in the chest 12/31/2013  . Estrogen deficiency 02/19/2014  . Breast cancer screening 02/19/2014  . Weight gain 02/19/2014  . COPD (chronic obstructive pulmonary disease) ([place]) 04/21/2014  . History of pulmonary embolus (PE) 04/22/2014  . CN (constipation) 05/10/2014  . DOE (dyspnea on exertion) 06/24/2014  . Chronic bronchitis ([place]) 09/23/2014  . Vertigo 09/24/2014  . Encounter for therapeutic drug monitoring 10/08/2014  . Gout 10/27/2014  . Breast mass 10/27/2014  . Morbid obesity with BMI of 50.0-59.9, adult ([place]) 12/18/2014  . CKD (chronic kidney disease) stage 3, GFR 30-59 ml/min (HCC) 12/18/2014  . Dyslipidemia associated with type 2 diabetes mellitus ([place]) 02/06/2015  . Type 2 diabetes mellitus with stage 3b chronic kidney disease, with long-term current use of insulin ([place]) 02/06/2015  . Chronic pain syndrome 02/06/2015  . Breast cancer ([place]) 04/26/2015  . Vitamin D deficiency 08/02/2015  . Cough variant asthma 11/17/2015  . Severe episode of recurrent major depressive disorder, without psychotic features ([place]) 06/23/2016  . Endometrial polyp 06/23/2016  . Vaginitis and vulvovaginitis 06/23/2016  . Esophageal dysphagia 11/07/2016  . GERD with esophagitis 11/15/2016  . Memory loss 11/15/2016  . Multiple  benign nevi 11/15/2016  . Anxiety associated with depression 06/08/2017  . Hyperkalemia 07/26/2017  . Type 2 diabetes mellitus with diabetic  neuropathy, with long-term current use of insulin ([place]) 12/13/2017  . Type 2 diabetes mellitus with hyperglycemia, with long-term current use of insulin ([place]) 03/25/2019  . Pulmonary embolus ([place]) 07/15/2019  . Moderate nonproliferative diabetic retinopathy of both eyes without macular edema associated with type 2 diabetes mellitus ([name]) 08/27/2019  . Chest pain on exertion 02/14/2020  . Non-STEMI (non-ST elevated myocardial infarction) ([place]) 02/15/2020  . Presence of stent in right coronary artery   . Chronic radicular lumbar pain 03/18/2020  . Lumbar spondylosis 03/18/2020  . Spinal stenosis, lumbar region, with neurogenic claudication 03/18/2020   Resolved Ambulatory Problems    Diagnosis Date Noted  . DM w/o Complication Type II 42/68/3419  . RECTAL BLEEDING 01/18/2009  . URTICARIA 05/14/2008  . Polymyalgia rheumatica ([place]) 01/18/2009  . Cholelithiasis 06/06/2010  . UTI 06/06/2010  . Chest pain, unspecified 09/15/2010  . Shortness of breath 10/12/2010  . Hypotension 01/26/2012  . Elevated troponin 01/26/2012  . Chest pain 01/26/2012  . Pulmonary embolus ([place]) 01/28/2012  . DM (diabetes mellitus), type 2, uncontrolled, with renal complications ([place]) 62/22/9798  . Itching 01/08/2013  . Dysuria 12/31/2013  . Sepsis secondary to UTI ([place]) 04/21/2014  . Fever 04/21/2014  . Sepsis ([place]) 04/21/2014  . Acute pyelonephritis 04/22/2014  . Abnormal stress test 09/27/2014  . Preoperative clearance 08/16/2014  . Acute on chronic diastolic CHF (congestive heart failure) ([place]) 10/08/2017  . CHF (congestive heart failure) ([place]) 10/08/2017  . Postmenopausal bleeding 06/04/2014  . Pneumonia due to COVID-19 virus 01/25/2019  . Acute respiratory failure with hypoxia ([place]) 01/29/2019  . Follow-up examination after eye surgery 08/27/2019  . Right  epiretinal membrane 08/27/2019  . Vitreomacular adhesion of right eye 08/27/2019  . Diabetes mellitus without complication ([place]) 92/03/9416  . Intermediate stage nonexudative age-related macular degeneration of both eyes 08/27/2019   Past Medical History:  Diagnosis Date  . Allergy   . Anemia, unspecified   . Anxiety   . Arthritis   . Chronic diastolic CHF (congestive heart failure) ([place])   . CKD (chronic kidney disease), stage III ([place])   . Coronary artery disease   . Dementia ([place])   . Depression   . Diabetes mellitus   . Gout, unspecified   . History of blood clots   . Hypertension   . Joint pain   . Joint swelling   . Morbid obesity ([place])   . Myocardial infarction ([place]) 2004  . Numbness   . Obstructive sleep apnea   . Osteoarthrosis, unspecified whether generalized or localized, lower leg   . Pain, chronic   . Pulmonary emboli ([place]) 01/2012  . Urinary incontinence   . Wears dentures    Constitutional Exam  General appearance: alert, cooperative and mildly obese Vitals:   03/18/20 1054  BP: (!) 138/50  Pulse: 83  Resp: 18  Temp: 97.6 F (36.4 C)  TempSrc: Temporal  SpO2: 99%  Weight: (!) 338 lb (153.3 kg)  Height: _0  (1.702 m)   BMI Assessment: Estimated body mass index is 52.94 kg/m as calculated from the following:   Height as of this encounter: _1  (1.702 m).   Weight as of this encounter: 338 lb (153.3 kg).  BMI interpretation table: BMI level Category Range association with higher incidence of chronic pain  <18 kg/m2 Underweight   18.5-24.9 kg/m2 Ideal body weight   25-29.9 kg/m2 Overweight Increased incidence by 20%  30-34.9 kg/m2 Obese (Class I) Increased incidence by 68%  35-39.9 kg/m2 Severe obesity (Class II) Increased  incidence by 136%  >40 kg/m2 Extreme obesity (Class III) Increased incidence by 254%   Patient's current BMI Ideal Body weight  Body mass index is 52.94 kg/m. Ideal body weight: 61.6 kg (135 lb 12.9 oz) Adjusted ideal  body weight: 98.3 kg (216 lb 10.9 oz)   BMI Readings from Last 4 Encounters:  03/18/20 52.94 kg/m  03/03/20 52.94 kg/m  03/02/20 52.94 kg/m  02/23/20 52.95 kg/m   Wt Readings from Last 4 Encounters:  03/18/20 (!) 338 lb (153.3 kg)  03/03/20 (!) 338 lb (153.3 kg)  03/02/20 (!) 338 lb (153.3 kg)  02/23/20 (!) 338 lb 1.6 oz (153.4 kg)    Psych/Mental status: Alert, oriented x 3 (person, place, & time)       Eyes: PERLA Respiratory: No evidence of acute respiratory distress  Cervical Spine Exam  Skin & Axial Inspection: No masses, redness, edema, swelling, or associated skin lesions Alignment: Symmetrical Functional ROM: Decreased ROM      Stability: No instability detected Muscle Tone/Strength: Functionally intact. No obvious neuro-muscular anomalies detected. Sensory (Neurological): Musculoskeletal pain pattern Palpation: No palpable anomalies              Upper Extremity (UE) Exam    Side: Right upper extremity  Side: Left upper extremity  Skin & Extremity Inspection: Skin color, temperature, and hair growth are WNL. No peripheral edema or cyanosis. No masses, redness, swelling, asymmetry, or associated skin lesions. No contractures.  Skin & Extremity Inspection: Skin color, temperature, and hair growth are WNL. No peripheral edema or cyanosis. No masses, redness, swelling, asymmetry, or associated skin lesions. No contractures.  Functional ROM: Pain restricted ROM for all joints of upper extremity  Functional ROM: Pain restricted ROM for all joints of upper extremity  Muscle Tone/Strength: Functionally intact. No obvious neuro-muscular anomalies detected.   Muscle Tone/Strength: Functionally intact. No obvious neuro-muscular anomalies detected.  Sensory (Neurological): Musculoskeletal pain pattern          Sensory (Neurological): Musculoskeletal pain pattern          Palpation: No palpable anomalies              Palpation: No palpable anomalies              Provocative  Test(s):  Phalen's test: deferred Tinel's test: deferred Apley's scratch test (touch opposite shoulder):  Action 1 (Across chest): deferred Action 2 (Overhead): deferred Action 3 (LB reach): deferred   Provocative Test(s):  Phalen's test: deferred Tinel's test: deferred Apley's scratch test (touch opposite shoulder):  Action 1 (Across chest): deferred Action 2 (Overhead): deferred Action 3 (LB reach): deferred    Thoracic Spine Area Exam  Skin & Axial Inspection: No masses, redness, or swelling Alignment: Symmetrical Functional ROM: Pain restricted ROM Stability: No instability detected Muscle Tone/Strength: Functionally intact. No obvious neuro-muscular anomalies detected. Sensory (Neurological): Unimpaired Muscle strength & Tone: No palpable anomalies  Lumbar Exam  Skin & Axial Inspection: No masses, redness, or swelling Alignment: Symmetrical Functional ROM: Pain restricted ROM       Stability: No instability detected Muscle Tone/Strength: Functionally intact. No obvious neuro-muscular anomalies detected. Sensory (Neurological): Musculoskeletal pain pattern Palpation: No palpable anomalies       Provocative Tests: Hyperextension/rotation test: Unable to perform       Lumbar quadrant test (Kemp's test): Unable to perform       Lateral bending test: Unable to perform       Patrick's Maneuver: Unable to perform  FABER* test: deferred today                   S-I anterior distraction/compression test: deferred today         S-I lateral compression test: deferred today         S-I Thigh-thrust test: deferred today         S-I Gaenslen's test: deferred today         *(Flexion, ABduction and External Rotation)  Gait & Posture Assessment  Ambulation: Patient came in today in a wheel chair Gait: Modified gait pattern (slower gait speed, wider stride width, and longer stance duration) associated with morbid obesity Posture: Difficulty standing up straight, due  to pain   Lower Extremity Exam    Side: Right lower extremity  Side: Left lower extremity  Stability: No instability observed          Stability: No instability observed          Skin & Extremity Inspection: Skin color, temperature, and hair growth are WNL. No peripheral edema or cyanosis. No masses, redness, swelling, asymmetry, or associated skin lesions. No contractures.  Skin & Extremity Inspection: Skin color, temperature, and hair growth are WNL. No peripheral edema or cyanosis. No masses, redness, swelling, asymmetry, or associated skin lesions. No contractures.  Functional ROM: Pain restricted ROM for all joints of the lower extremity          Functional ROM: Pain restricted ROM for all joints of the lower extremity          Muscle Tone/Strength: Functionally intact. No obvious neuro-muscular anomalies detected.  Muscle Tone/Strength: Functionally intact. No obvious neuro-muscular anomalies detected.  Sensory (Neurological): Neurogenic pain pattern        Sensory (Neurological): Neurogenic pain pattern        DTR: Patellar: deferred today Achilles: deferred today Plantar: deferred today  DTR: Patellar: deferred today Achilles: deferred today Plantar: deferred today  Palpation: No palpable anomalies  Palpation: No palpable anomalies   Assessment  Primary Diagnosis & Pertinent Problem List: The primary encounter diagnosis was Chronic radicular lumbar pain. Diagnoses of Lumbar facet arthropathy, Lumbar spondylosis, Spinal stenosis, lumbar region, with neurogenic claudication, Bilateral primary osteoarthritis of knee, and Chronic pain syndrome were also pertinent to this visit.  Visit Diagnosis (New problems to examiner): 1. Chronic radicular lumbar pain   2. Lumbar facet arthropathy   3. Lumbar spondylosis   4. Spinal stenosis, lumbar region, with neurogenic claudication   5. Bilateral primary osteoarthritis of knee   6. Chronic pain syndrome    Plan of Care (Initial workup plan)    Lab Orders     Drug Screen 10 W/Conf, Serum   I had extensive discussion with Ms. Weimer as well as her daughter about treatment plan.  I was very honest with them inform them that we have limited treatment options in the context of her core morbidities which include cardiovascular disease, chronic kidney disease, type 2 diabetes with neuropathy as well as cardiomyopathy.  Do not recommend NSAID in the context of her being on dual antiplatelet therapy.  Patient is on Cymbalta which is reasonable for pain management and mood improvement.  She was also started on Lyrica 25 mg twice a day.  This is being managed by her primary care provider.  This can be titrated up to 50 mg twice a day if patient is tolerating this well.  This medication should be renally dosed based upon the patient's GFR.  In regards to  interventional therapies which is my expertise, patient is high risk to be off of her dual antiplatelet therapy specifically her Plavix given her most recent coronary stent placement.  In regards to opioid medications, I expressed my concerns regarding the risk in the context of Ms. Montz's morbid obesity, symptoms concerning for obstructive sleep apnea as well as symptoms of dizziness and imbalance reported and previous medical notes.  I informed the patient that opioid analgesics can increase her risk of dizziness, falls, cognitive dysfunction, immune dysfunction, constipation, nausea.  She does have fairly limited options.  I will obtain a urine toxicology screen today.  Patient will follow up in 2 weeks after results are in at which point we can discuss low-dose opioid therapy or even buprenorphine for pain management.  I am reluctant to prescribe a controlled 2 for her however she has failed tramadol in the past.  Can see if insurance will cover buprenorphine which has less side effect potential for respiratory depression and dizziness then most controlled to opioids.  Patient endorsed  understanding.  Pharmacological management options:  Opioid Analgesics: The patient was informed that there is no guarantee that she would be a candidate for opioid analgesics. The decision will be made following CDC guidelines. This decision will be based on the results of diagnostic studies, as well as Ms. Drumwright's risk profile.   Membrane stabilizer: Continue on Lyrica as prescribed  Muscle relaxant: To be determined at a later time  NSAID: Medically contraindicated  Other analgesic(s): To be determined at a later time   Interventional management options: Ms. Hollan was informed that there is no guarantee that she would be a candidate for interventional therapies. The decision will be based on the results of diagnostic studies, as well as Ms. Robb's risk profile.  Procedure(s) under consideration:  Not a candidate as patient on dual antiplatelet therapy and high risk to be off for elective procedure.   Provider-requested follow-up: Return in about 2 weeks (around 04/01/2020) for 2nd pt visit (40 mins).  Future Appointments  Date Time Provider [place]  03/24/2020 11:00 AM [name], NP [facility] None  03/30/2020 10:45 AM Burtis Junes, NP [facility]  04/12/2020 11:40 AM Gillis Santa, MD [facility] None  04/22/2020 11:00 AM [name], NP [facility] None  06/01/2020  3:30 PM Marzetta Board, DPM [facility]  06/29/2020 11:10 AM Shamleffer, [name], MD [facility]  08/23/2020 11:30 AM [facility] None  08/23/2020 12:00 PM [name], MD [facility] None    Note by: Gillis Santa, MD Date: 03/18/2020; Time: 4:04 PM

## 2020-03-23 ENCOUNTER — Telehealth: Payer: Self-pay

## 2020-03-23 NOTE — Telephone Encounter (Addendum)
[employer] called about a medical necessity form for diabetic shoes and inserts. They stated [name] signed the form, but they are asking for Dr. Mariea Clonts to co-sign. I told her that it evidently is still in the process of scanning, as I was not able to view it in Epic. I told her she could fax the signed form back to [place] to have Dr. Mariea Clonts view.

## 2020-03-24 ENCOUNTER — Other Ambulatory Visit: Payer: Self-pay

## 2020-03-24 ENCOUNTER — Ambulatory Visit (INDEPENDENT_AMBULATORY_CARE_PROVIDER_SITE_OTHER): Payer: PPO | Admitting: Nurse Practitioner

## 2020-03-24 ENCOUNTER — Encounter: Payer: Self-pay | Admitting: Nurse Practitioner

## 2020-03-24 DIAGNOSIS — F419 Anxiety disorder, unspecified: Secondary | ICD-10-CM | POA: Diagnosis not present

## 2020-03-24 DIAGNOSIS — F0390 Unspecified dementia without behavioral disturbance: Secondary | ICD-10-CM

## 2020-03-24 DIAGNOSIS — Z794 Long term (current) use of insulin: Secondary | ICD-10-CM

## 2020-03-24 DIAGNOSIS — E1122 Type 2 diabetes mellitus with diabetic chronic kidney disease: Secondary | ICD-10-CM | POA: Diagnosis not present

## 2020-03-24 DIAGNOSIS — F322 Major depressive disorder, single episode, severe without psychotic features: Secondary | ICD-10-CM | POA: Diagnosis not present

## 2020-03-24 DIAGNOSIS — N390 Urinary tract infection, site not specified: Secondary | ICD-10-CM | POA: Diagnosis not present

## 2020-03-24 DIAGNOSIS — I25118 Atherosclerotic heart disease of native coronary artery with other forms of angina pectoris: Secondary | ICD-10-CM | POA: Diagnosis not present

## 2020-03-24 DIAGNOSIS — M170 Bilateral primary osteoarthritis of knee: Secondary | ICD-10-CM | POA: Diagnosis not present

## 2020-03-24 DIAGNOSIS — I10 Essential (primary) hypertension: Secondary | ICD-10-CM

## 2020-03-24 DIAGNOSIS — I2510 Atherosclerotic heart disease of native coronary artery without angina pectoris: Secondary | ICD-10-CM | POA: Diagnosis not present

## 2020-03-24 DIAGNOSIS — D631 Anemia in chronic kidney disease: Secondary | ICD-10-CM | POA: Diagnosis not present

## 2020-03-24 DIAGNOSIS — D630 Anemia in neoplastic disease: Secondary | ICD-10-CM | POA: Diagnosis not present

## 2020-03-24 DIAGNOSIS — I130 Hypertensive heart and chronic kidney disease with heart failure and stage 1 through stage 4 chronic kidney disease, or unspecified chronic kidney disease: Secondary | ICD-10-CM | POA: Diagnosis not present

## 2020-03-24 DIAGNOSIS — M47812 Spondylosis without myelopathy or radiculopathy, cervical region: Secondary | ICD-10-CM | POA: Diagnosis not present

## 2020-03-24 DIAGNOSIS — E1140 Type 2 diabetes mellitus with diabetic neuropathy, unspecified: Secondary | ICD-10-CM | POA: Diagnosis not present

## 2020-03-24 DIAGNOSIS — G44209 Tension-type headache, unspecified, not intractable: Secondary | ICD-10-CM

## 2020-03-24 DIAGNOSIS — N183 Chronic kidney disease, stage 3 unspecified: Secondary | ICD-10-CM | POA: Diagnosis not present

## 2020-03-24 DIAGNOSIS — Z48812 Encounter for surgical aftercare following surgery on the circulatory system: Secondary | ICD-10-CM | POA: Diagnosis not present

## 2020-03-24 DIAGNOSIS — I509 Heart failure, unspecified: Secondary | ICD-10-CM | POA: Diagnosis not present

## 2020-03-24 DIAGNOSIS — I4891 Unspecified atrial fibrillation: Secondary | ICD-10-CM | POA: Diagnosis not present

## 2020-03-24 DIAGNOSIS — N2581 Secondary hyperparathyroidism of renal origin: Secondary | ICD-10-CM | POA: Diagnosis not present

## 2020-03-24 DIAGNOSIS — E875 Hyperkalemia: Secondary | ICD-10-CM | POA: Diagnosis not present

## 2020-03-24 DIAGNOSIS — N189 Chronic kidney disease, unspecified: Secondary | ICD-10-CM | POA: Diagnosis not present

## 2020-03-24 DIAGNOSIS — C50919 Malignant neoplasm of unspecified site of unspecified female breast: Secondary | ICD-10-CM | POA: Diagnosis not present

## 2020-03-24 DIAGNOSIS — M1030 Gout due to renal impairment, unspecified site: Secondary | ICD-10-CM | POA: Diagnosis not present

## 2020-03-24 DIAGNOSIS — I214 Non-ST elevation (NSTEMI) myocardial infarction: Secondary | ICD-10-CM | POA: Diagnosis not present

## 2020-03-24 DIAGNOSIS — G8929 Other chronic pain: Secondary | ICD-10-CM | POA: Diagnosis not present

## 2020-03-24 DIAGNOSIS — E785 Hyperlipidemia, unspecified: Secondary | ICD-10-CM | POA: Diagnosis not present

## 2020-03-24 DIAGNOSIS — D509 Iron deficiency anemia, unspecified: Secondary | ICD-10-CM | POA: Diagnosis not present

## 2020-03-24 DIAGNOSIS — I2584 Coronary atherosclerosis due to calcified coronary lesion: Secondary | ICD-10-CM | POA: Diagnosis not present

## 2020-03-24 DIAGNOSIS — J449 Chronic obstructive pulmonary disease, unspecified: Secondary | ICD-10-CM | POA: Diagnosis not present

## 2020-03-24 DIAGNOSIS — I2782 Chronic pulmonary embolism: Secondary | ICD-10-CM | POA: Diagnosis not present

## 2020-03-24 DIAGNOSIS — G4700 Insomnia, unspecified: Secondary | ICD-10-CM | POA: Diagnosis not present

## 2020-03-24 DIAGNOSIS — F32A Depression, unspecified: Secondary | ICD-10-CM | POA: Diagnosis not present

## 2020-03-24 DIAGNOSIS — K219 Gastro-esophageal reflux disease without esophagitis: Secondary | ICD-10-CM

## 2020-03-24 DIAGNOSIS — I252 Old myocardial infarction: Secondary | ICD-10-CM | POA: Diagnosis not present

## 2020-03-24 DIAGNOSIS — I129 Hypertensive chronic kidney disease with stage 1 through stage 4 chronic kidney disease, or unspecified chronic kidney disease: Secondary | ICD-10-CM | POA: Diagnosis not present

## 2020-03-24 DIAGNOSIS — N1832 Chronic kidney disease, stage 3b: Secondary | ICD-10-CM | POA: Diagnosis not present

## 2020-03-24 NOTE — Progress Notes (Signed)
This service is provided via telemedicine  No vital signs collected/recorded due to the encounter was a telemedicine visit.   Location of patient (ex: home, work):  Home  Patient consents to a telephone visit:  Yes, see telephone encounter dated 01/29/2020 with annual consent   Location of the provider (ex: office, home):  [facility], Office   Name of any referring provider:  N/A  Names of all persons participating in the telemedicine service and their role in the encount    Careteam: Patient Care Team: Dewaine Oats, Carlos American, NP as PCP - General (Geriatric Medicine) [name], MD as PCP - Cardiology (Cardiology) [name], [name], MD (Inactive) as Consulting Physician (Cardiology) [name], MD as Consulting Physician (Ophthalmology) [name], MD as Consulting Physician (General Surgery) [name], MD (Inactive) as Referring Physician (Obstetrics and Gynecology) [name] [name], MD as Consulting Physician (Ophthalmology) [facility], [name], MD as Consulting Physician (Endocrinology) [name], MD as Consulting Physician (Hematology) [name], DO as Consulting Physician (Neurology)  Advanced Directive information Does Patient Have a Medical Advance Directive?: Yes, Type of Advance Directive: Out of facility DNR (pink MOST or yellow form), Pre-existing out of facility DNR order (yellow form or pink MOST form): Pink MOST form placed in chart (order not valid for inpatient use), Does patient want to make changes to medical advance directive?: No - Patient declined  Allergies  Allergen Reactions  . Sulfonamide Derivatives Swelling    Mouth swelling  . Aricept [Donepezil Hcl] Diarrhea and Nausea And Vomiting    GI upset/loose stools  . Tramadol Nausea And Vomiting    Chief Complaint  Patient presents with  . Medical Management of Chronic Issues    3 month follow-up. Discuss need for Tdap and foot exam.  Refill medication for potassium at [facility].      HPI: Patient is a 85 y.o. female for routine follow up.   Chronic pain- seeing pain specialist, did not change anything at initial appt.   She went to Ed on 10/27 due to chest pain, with recent NSTEMI but did not feel like it was due to recurrent ACS and PE was ruled out. She was taken off her xarelto due to placing her on plavix.   CKD- has follow up with nephrologist today. Cr noted to have increased when in the ED for chestpain.[name]   DM- followed by endocrine, continues to have high blood sugars occasionally. Continues on cymbalta and lyrica due to neuropathy. Does not eat consistently   Depression- ongoing, daughter plans to set her up with counselor   Pt with hyperkalemia due to CKD- needing refill sodium polystyrene   Headaches- controlled on topamax  GERD- continues on protonix for GER  Review of Systems:  Review of Systems  Constitutional: Negative for chills, fever and weight loss.  HENT: Negative for tinnitus.   Respiratory: Negative for cough, sputum production and shortness of breath.   Cardiovascular: Negative for chest pain, palpitations and leg swelling.  Gastrointestinal: Negative for abdominal pain, constipation, diarrhea and heartburn.  Genitourinary: Negative for dysuria, frequency and urgency.  Musculoskeletal: Positive for back pain, joint pain and myalgias. Negative for falls.  Skin: Negative.   Neurological: Positive for tingling, sensory change and weakness. Negative for dizziness and headaches.  Psychiatric/Behavioral: Positive for depression and memory loss. The patient is not nervous/anxious and does not have insomnia.     Past Medical History:  Diagnosis Date  . Allergy  takes Mucinex daily as needed  . Anemia, unspecified   . Anxiety    takes Clonazepam daily as needed  . Arthritis   . Breast cancer ([place])   . Chronic diastolic CHF (congestive heart failure) (HCC)    takes Furosemide  daily  . CKD (chronic kidney disease), stage III ([place])   . Coronary artery disease    a. s/p IMI 2004 tx with BMS to RCA;  b. s/p Promus DES to RCA 2/12 c. abnormal nuc 2016 -> cath with med rx. d. NSTEMI 02/2020  mv CAD with severe ISR in pRCA and m/dRCA lesion both treated with DES.  . Dementia ([place])   . Depression    takes Cymbalta daily  . Diabetes mellitus    insulin daily  . GERD (gastroesophageal reflux disease)    takes Protonix daily  . Gout, unspecified   . Headache    occasionally  . History of blood clots   . Hyperlipidemia    takes Pravastatin daily  . Hypertension   . Insomnia   . Joint pain   . Joint swelling   . Morbid obesity ([place])   . Myocardial infarction ([place]) 2004  . Numbness   . Obstructive sleep apnea    does not wear cpap  . Osteoarthritis   . Osteoarthritis   . Osteoarthrosis, unspecified whether generalized or localized, lower leg   . Pain, chronic   . Polymyalgia rheumatica ([place])   . Pulmonary emboli ([place]) 01/2012   felt to need lifelong anticoagulation but Xarelto dc in 02/2020 due to need for DAPT  . Urinary incontinence    takes Linzess daily  . Vertigo    hx of;was taking Meclizine if needed  . Wears dentures    Past Surgical History:  Procedure Laterality Date  . ABDOMINAL HYSTERECTOMY     partial  . APPENDECTOMY    . blood clots/legs and lungs  2013  . BREAST BIOPSY Left 07/22/2014  . BREAST BIOPSY Left 02/10/2013  . BREAST LUMPECTOMY Left 11/05/2014  . BREAST LUMPECTOMY WITH RADIOACTIVE SEED LOCALIZATION Left 11/05/2014   Procedure: LEFT BREAST LUMPECTOMY WITH RADIOACTIVE SEED LOCALIZATION;  Surgeon: [name], MD;  Location: [place];  Service: General;  Laterality: Left;  . CARDIAC CATHETERIZATION    . COLONOSCOPY    . CORONARY ANGIOPLASTY  2  . CORONARY STENT INTERVENTION N/A 02/17/2020   Procedure: CORONARY STENT INTERVENTION;  Surgeon: Leonie Man, MD;  Location: [place] [facility];  Service: Cardiovascular;   Laterality: N/A;  . ESOPHAGOGASTRODUODENOSCOPY (EGD) WITH PROPOFOL N/A 11/07/2016   Procedure: ESOPHAGOGASTRODUODENOSCOPY (EGD) WITH PROPOFOL;  Surgeon: Gatha Mayer, MD;  Location: [facility];  Service: Endoscopy;  Laterality: N/A;  . EXCISION OF SKIN TAG Right 11/05/2014   Procedure: EXCISION OF RIGHT EYELID SKIN TAG;  Surgeon: [name], MD;  Location: [place];  Service: General;  Laterality: Right;  . EYE SURGERY Bilateral    cataract   . GASTRIC BYPASS  1977    reversed in 1979, [place] CATH AND CORONARY ANGIOGRAPHY N/A 02/17/2020   Procedure: LEFT HEART CATH AND CORONARY ANGIOGRAPHY;  Surgeon: Leonie Man, MD;  Location: [place] [facility];  Service: Cardiovascular;  Laterality: N/A;  . LEFT HEART CATHETERIZATION WITH CORONARY ANGIOGRAM N/A 06/29/2014   Procedure: LEFT HEART CATHETERIZATION WITH CORONARY ANGIOGRAM;  Surgeon: Troy Sine, MD;  Location: [facility] CATH LAB;  Service: Cardiovascular;  Laterality: N/A;  . MEMBRANE PEEL Right 10/23/2018   Procedure:  MEMBRANE PEEL;  Surgeon: Hurman Horn, MD;  Location: [place];  Service: Ophthalmology;  Laterality: Right;  . MI with stent placement  2004  . PARS PLANA VITRECTOMY Right 10/23/2018   Procedure: PARS PLANA VITRECTOMY WITH 25 GAUGE;  Surgeon: Hurman Horn, MD;  Location: [place];  Service: Ophthalmology;  Laterality: Right;   Social History:   reports that she has never smoked. She has never used smokeless tobacco. She reports that she does not drink alcohol and does not use drugs.  Family History  Problem Relation Age of Onset  . Breast cancer Mother 45  . Heart disease Mother   . Throat cancer Father   . Hypertension Father   . Arthritis Father   . Diabetes Father   . Arthritis Sister   . Obesity Sister   . Diabetes Sister   . Heart disease Cousin   . Colon cancer Neg Hx   . Stomach cancer Neg Hx   . Esophageal cancer Neg Hx     Medications: Patient's Medications  New Prescriptions    No medications on file  Previous Medications   ACETAMINOPHEN (TYLENOL) 500 MG TABLET    Take 1,000 mg by mouth daily.   ALBUTEROL (PROAIR HFA) 108 (90 BASE) MCG/ACT INHALER    Inhale 1-2 puffs into the lungs every 6 (six) hours as needed for wheezing or shortness of breath.   ANASTROZOLE (ARIMIDEX) 1 MG TABLET    TAKE 1 TABLET BY MOUTH EVERY DAY   ASPIRIN EC 81 MG EC TABLET    Take 1 tablet (81 mg total) by mouth daily. Swallow whole.   ATORVASTATIN (LIPITOR) 80 MG TABLET    Take 1 tablet (80 mg total) by mouth daily.   BUDESONIDE-FORMOTEROL (SYMBICORT) 160-4.5 MCG/ACT INHALER    Inhale 2 puffs into the lungs 2 (two) times daily. For bronchitis   CETIRIZINE (ZYRTEC) 10 MG TABLET    Take 1 tablet (10 mg total) by mouth daily.   CLOPIDOGREL (PLAVIX) 75 MG TABLET    Take 1 tablet (75 mg total) by mouth daily with breakfast.   CONTINUOUS BLOOD GLUC SENSOR (FREESTYLE LIBRE 14 DAY SENSOR) MISC    Inject 1 Device into the skin as directed.   CYANOCOBALAMIN (VITAMIN B-12 PO)    Take 1 tablet by mouth once a week.    DULOXETINE (CYMBALTA) 60 MG CAPSULE    TAKE 1 CAPSULE BY MOUTH EVERY DAY FOR ANXIETY   ERGOCALCIFEROL (VITAMIN D2) 1.25 MG (50000 UT) CAPSULE    Take 1 capsule (50,000 Units total) by mouth once a week.   FEBUXOSTAT (ULORIC) 40 MG TABLET    TAKE 1 TABLET BY MOUTH EVERY DAY   FLUTICASONE (FLONASE) 50 MCG/ACT NASAL SPRAY    Place 1 spray into both nostrils in the morning and at bedtime.   FUROSEMIDE (LASIX) 20 MG TABLET    TAKE 1 TABLET BY MOUTH EVERY DAY AS NEEDED   HYDRALAZINE (APRESOLINE) 50 MG TABLET    Take 1 tablet (50 mg total) by mouth 2 (two) times daily.   HYDROXYZINE (ATARAX/VISTARIL) 10 MG TABLET    Take 1 tablet by mouth three times daily as needed   INSULIN ASPART (NOVOLOG FLEXPEN) 100 UNIT/ML FLEXPEN    Inject 16 Units into the skin 3 (three) times daily with meals. Max daily dose 70 units   INSULIN GLARGINE, 2 UNIT DIAL, (TOUJEO MAX SOLOSTAR) 300 UNIT/ML SOLOSTAR PEN    Inject  38 Units into the skin daily. Patient assistance  program provides   ISOSORBIDE MONONITRATE (IMDUR) 60 MG 24 HR TABLET    TAKE 1 TABLET BY MOUTH EVERY DAY   MECLIZINE (ANTIVERT) 25 MG TABLET    TAKE 1 TABLET BY MOUTH THREE TIMES DAILY AS NEEDED FOR DIZZINESS   MEMANTINE (NAMENDA) 5 MG TABLET    TAKE 2 TABLETS BY MOUTH EVERY MORNING and TAKE 1 TABLET EVERY EVENING FOR memory   METOPROLOL SUCCINATE (TOPROL-XL) 50 MG 24 HR TABLET    Take 50 mg by mouth daily.   NITROGLYCERIN (NITROSTAT) 0.4 MG SL TABLET    Take one tablet under the tongue every 5 minutes as needed for chest pain   PANTOPRAZOLE (PROTONIX) 40 MG TABLET    TAKE 1 TABLET BY MOUTH EVERY DAY   PREGABALIN (LYRICA) 25 MG CAPSULE    Take 1 capsule (25 mg total) by mouth 2 (two) times daily.   SODIUM POLYSTYRENE (SPS) 15 GM/60ML SUSPENSION    TAKE 15 grams (60 mls) BY MOUTH ONCE WEEKLY TO keep potassium down   TOPIRAMATE (TOPAMAX) 25 MG TABLET    Take 1 tablet (25 mg total) by mouth daily.  Modified Medications   No medications on file  Discontinued Medications   No medications on file    Physical Exam:  There were no vitals filed for this visit. There is no height or weight on file to calculate BMI. Wt Readings from Last 3 Encounters:  03/18/20 (!) 338 lb (153.3 kg)  03/03/20 (!) 338 lb (153.3 kg)  03/02/20 (!) 338 lb (153.3 kg)      Labs reviewed: Basic Metabolic Panel: Recent Labs    02/23/20 1408 03/02/20 1516 03/02/20 1517 03/03/20 1237  NA 142  --  141 138  K 4.7  --  4.4 4.0  CL 109  --  103 104  CO2 27  --  26 23  GLUCOSE 112*  --  277* 254*  BUN 29*  --  29* 32*  CREATININE 1.64*  --  1.47* 1.98*  CALCIUM 9.2  --  8.6* 8.6*  TSH  --  2.370  --   --    Liver Function Tests: Recent Labs    07/24/19 1438 12/23/19 1021 02/23/20 1408  AST 9* 9* 10*  ALT 6 7 6   ALKPHOS 92  --  84  BILITOT 0.3 0.3 <0.2*  PROT 7.6 6.9 7.9  ALBUMIN 2.8*  --  2.9*   No results for input(s): LIPASE, AMYLASE in the last 8760  hours. No results for input(s): AMMONIA in the last 8760 hours. CBC: Recent Labs    07/24/19 1438 07/24/19 1438 12/23/19 1021 02/14/20 1624 02/23/20 1408 03/02/20 1517 03/03/20 1237  WBC 12.1*   < > 14.1*   < > 15.8* 11.8* 11.3*  NEUTROABS 8.7*  --  9,828*  --  11.6*  --   --   HGB 9.4*   < > 9.0*   < > 9.5* 9.3* 9.3*  HCT 31.2*   < > 28.9*   < > 31.7* 29.5* 32.5*  MCV 89.9   < > 87.8   < > 89.8 88 92.9  PLT 278   < > 279   < > 278 254 283   < > = values in this interval not displayed.   Lipid Panel: Recent Labs    12/23/19 1021 02/15/20 0636  CHOL 117 117  HDL 39* 40*  LDLCALC 58 60  TRIG 114 85  CHOLHDL 3.0 2.9   TSH: Recent Labs  03/02/20 1516  TSH 2.370   A1C: Lab Results  Component Value Date   HGBA1C 8.9 (A) 12/23/2019     Assessment/Plan 1. Other chronic pulmonary embolism without acute cor pulmonale (HCC) Stable, without worsening cough, congestion or shortness of breath.  2. Coronary artery disease of native artery of native heart with stable angina pectoris ([place]) -went back to ED for evaluation but was not felt to be cardiac in nature. Without recurrent chest pain at this time. Continues on plavix after NSTEMI. Has ongoing cardiology follow up. Stressed importance of proper Bp, lipid and diabetic controlled.   3. Essential hypertension No bp for todays visit. bp variable on follow ups. Encouraged dietary modifications however she has expressed she is not able to comply with this. Continues on hydralazine, imdur, metoprolol  4. Type 2 diabetes mellitus with diabetic neuropathy, with long-term current use of insulin (HCC) Not controlled, again stressed compliance with diet and medications.  -discussed complications associated with diabetes including retinopathy, nephropathy, neuropathy as well as increased risk of cardiovascular disease. We went over the benefit seen with glycemic control.  Continues with neuropathy, lyrica providing some benefit.    5. Dementia without behavioral disturbance, unspecified dementia type ([place]) Stable, daughter and sister help her due to progressive memory deficit. Continues on namenda  BID   6. Depression, major, single episode, severe ([place]) -ongoing, daughter is trying to get her into seeing a therapist. Continues on cymbalta   7. Gastroesophageal reflux disease, unspecified whether esophagitis present -controlled on protonix  8. Morbid (severe) obesity due to excess calories ([place]) Encourage healthy weight reduction with dietary modifications and increase in physical activity.   9. Tension headache Stable at this time, without worsening of headaches noted at this time.   Next appt: 4 months for routine follow up.  Carlos American. Harle Battiest  [facility] [phone]    Virtual Visit via Deloris Ping  I connected with patient on 03/24/20 at 11:00 AM EST by video and verified that I am speaking with the correct person using two identifiers.  Location: Patient: home Provider: [place]   I discussed the limitations, risks, security and privacy concerns of performing an evaluation and management service by telephone and the availability of in person appointments. I also discussed with the patient that there may be a patient responsible charge related to this service. The patient expressed understanding and agreed to proceed.   I discussed the assessment and treatment plan with the patient. The patient was provided an opportunity to ask questions and all were answered. The patient agreed with the plan and demonstrated an understanding of the instructions.   The patient was advised to call back or seek an in-person evaluation if the symptoms worsen or if the condition fails to improve as anticipated.  I provided 22 minutes of non-face-to-face time during this encounter.  Carlos American. Harle Battiest Avs printed and mailed er:  S.Chrae B/CMA, [name], NP, [name] (daughter) and  Patient   Time spent on call:  10 min with medical assistant

## 2020-03-25 ENCOUNTER — Encounter: Payer: Self-pay | Admitting: Nurse Practitioner

## 2020-03-29 ENCOUNTER — Telehealth: Payer: Self-pay

## 2020-03-29 MED ORDER — ASPIRIN 81 MG PO TBEC
81.0000 mg | DELAYED_RELEASE_TABLET | Freq: Every day | ORAL | 1 refills | Status: DC
Start: 2020-03-29 — End: 2020-05-12

## 2020-03-29 MED ORDER — ATORVASTATIN CALCIUM 80 MG PO TABS
80.0000 mg | ORAL_TABLET | Freq: Every day | ORAL | 1 refills | Status: DC
Start: 2020-03-29 — End: 2020-04-19

## 2020-03-29 MED ORDER — CLOPIDOGREL BISULFATE 75 MG PO TABS
75.0000 mg | ORAL_TABLET | Freq: Every day | ORAL | 1 refills | Status: DC
Start: 2020-03-29 — End: 2020-04-19

## 2020-03-29 NOTE — Telephone Encounter (Signed)
Refill request received from [facility]

## 2020-03-30 ENCOUNTER — Ambulatory Visit: Payer: PPO | Admitting: Nurse Practitioner

## 2020-03-30 ENCOUNTER — Other Ambulatory Visit: Payer: Self-pay

## 2020-04-03 LAB — DRUG SCREEN 10 W/CONF, SERUM
Amphetamines, IA: NEGATIVE ng/mL
Barbiturates, IA: NEGATIVE ug/mL
Benzodiazepines, IA: NEGATIVE ng/mL
Cocaine & Metabolite, IA: NEGATIVE ng/mL
Methadone, IA: NEGATIVE ng/mL
Opiates, IA: NEGATIVE ng/mL
Oxycodones, IA: NEGATIVE ng/mL
Phencyclidine, IA: NEGATIVE ng/mL
Propoxyphene, IA: NEGATIVE ng/mL
THC(Marijuana) Metabolite, IA: NEGATIVE ng/mL

## 2020-04-03 LAB — OXYCODONES,MS,WB/SP RFX
Oxycocone: NEGATIVE ng/mL
Oxycodones Confirmation: NEGATIVE
Oxymorphone: NEGATIVE ng/mL

## 2020-04-05 NOTE — Progress Notes (Signed)
CARDIOLOGY OFFICE NOTE  Date:  04/19/2020    [name] Date of Birth: [birth year] Medical Record #[id number]  PCP:  [name], NP  Cardiologist:  Meda Coffee  Chief Complaint  Patient presents with  . Follow-up    Seen for Dr. Meda Coffee    History of Present Illness: [name] is a 85 y.o. female who presents today for a follow up visit. Seen for Dr. Meda Coffee.   She has a history of dementia, CAD (BMS to RCA 2004, DESx2 to mid and proximal RCA 2012, recent NSTEMI 02/2020 s/p DES to RCA), chronic diastolic CHF, DM, morbid obesity, remote gastric bypass and reversal, anemia, PMR, HLD, depression, OCD, anxiety, OA, COPD, prior PE/DVT, CKD stage III, and breast CA.   She was seen in October for post hospital visit by [facility] after being admitted with chest pain/NSTEMI and underwent cath showing multivessel CAD but severe ISR in pRCA and m/dRCA lesion both treated with DES. There was diffuse mild to moderate disease in the LM as well as Lcx and LAD but nonobstructive and treated medically. LVEF was normal, borderline elevated LVEDP. 2D echo was technically difficult given habitus. Hospital course also notable for anemia requiring blood transfusion (has prior h/o anemia). IM discussed case with Dr. Irene Limbo, her oncologist, regarding ongoing maintenance Xarelto for prior h/o PE/DVT and the mutual decision was made to hold further Xarelto given need for DAPT and history of anemia.   Seemed to be feeling better at her last visit with improved SOB. But a D dimer was elevated - she was tachycardic - subsequent VQ scan with no evidence of PE.   Comes in today. Here with her sister who helps augment the history. Daughter was called to verify her medicines. She is in a wheelchair. She is spending most of her time - day and night - in the bed - really not getting out except to go to the doctor. Seeing PCP tomorrow. She has 2 daughters - [name] who has her own heart issues. [name] is looking at  foot surgery and will be out of commission for at least 10 weeks. She is requiring more and more help for just basic ADL's. She admits her daughters are worn out. Says they have no real options for care. She has had some fleeting chest pain - nothing exertional and nothing that sounds worrisome. Not really short of breath. Just likes to "lay in the bed" - she is happy and comfortable there.   Past Medical History:  Diagnosis Date  . Allergy    takes Mucinex daily as needed  . Anemia, unspecified   . Anxiety    takes Clonazepam daily as needed  . Arthritis   . Breast cancer ([place])   . Chronic diastolic CHF (congestive heart failure) (HCC)    takes Furosemide daily  . CKD (chronic kidney disease), stage III ([place])   . Coronary artery disease    a. s/p IMI 2004 tx with BMS to RCA;  b. s/p Promus DES to RCA 2/12 c. abnormal nuc 2016 -> cath with med rx. d. NSTEMI 02/2020  mv CAD with severe ISR in pRCA and m/dRCA lesion both treated with DES.  . Dementia ([place])   . Depression    takes Cymbalta daily  . Diabetes mellitus    insulin daily  . GERD (gastroesophageal reflux disease)    takes Protonix daily  . Gout, unspecified   . Headache    occasionally  . History  of blood clots   . Hyperlipidemia    takes Pravastatin daily  . Hypertension   . Insomnia   . Joint pain   . Joint swelling   . Morbid obesity ([place])   . Myocardial infarction ([place]) 2004  . Non-STEMI (non-ST elevated myocardial infarction) ([place]) 02/15/2020  . Numbness   . Obstructive sleep apnea    does not wear cpap  . Osteoarthritis   . Osteoarthritis   . Osteoarthrosis, unspecified whether generalized or localized, lower leg   . Pain, chronic   . Polymyalgia rheumatica ([place])   . Pulmonary emboli ([place]) 01/2012   felt to need lifelong anticoagulation but Xarelto dc in 02/2020 due to need for DAPT  . Urinary incontinence    takes Linzess daily  . Vertigo    hx of;was taking Meclizine if needed  . Wears dentures      Past Surgical History:  Procedure Laterality Date  . ABDOMINAL HYSTERECTOMY     partial  . APPENDECTOMY    . blood clots/legs and lungs  2013  . BREAST BIOPSY Left 07/22/2014  . BREAST BIOPSY Left 02/10/2013  . BREAST LUMPECTOMY Left 11/05/2014  . BREAST LUMPECTOMY WITH RADIOACTIVE SEED LOCALIZATION Left 11/05/2014   Procedure: LEFT BREAST LUMPECTOMY WITH RADIOACTIVE SEED LOCALIZATION;  Surgeon: [name], MD;  Location: [facility];  Service: General;  Laterality: Left;  . CARDIAC CATHETERIZATION    . COLONOSCOPY    . CORONARY ANGIOPLASTY  2  . CORONARY STENT INTERVENTION N/A 02/17/2020   Procedure: CORONARY STENT INTERVENTION;  Surgeon: Leonie Man, MD;  Location: [facility];  Service: Cardiovascular;  Laterality: N/A;  . ESOPHAGOGASTRODUODENOSCOPY (EGD) WITH PROPOFOL N/A 11/07/2016   Procedure: ESOPHAGOGASTRODUODENOSCOPY (EGD) WITH PROPOFOL;  Surgeon: Gatha Mayer, MD;  Location: [facility];  Service: Endoscopy;  Laterality: N/A;  . EXCISION OF SKIN TAG Right 11/05/2014   Procedure: EXCISION OF RIGHT EYELID SKIN TAG;  Surgeon: [name], MD;  Location: [facility];  Service: General;  Laterality: Right;  . EYE SURGERY Bilateral    cataract   . GASTRIC BYPASS  1977    reversed in 1979, Uncertain CATH AND CORONARY ANGIOGRAPHY N/A 02/17/2020   Procedure: LEFT HEART CATH AND CORONARY ANGIOGRAPHY;  Surgeon: Leonie Man, MD;  Location: [facility];  Service: Cardiovascular;  Laterality: N/A;  . LEFT HEART CATHETERIZATION WITH CORONARY ANGIOGRAM N/A 06/29/2014   Procedure: LEFT HEART CATHETERIZATION WITH CORONARY ANGIOGRAM;  Surgeon: Troy Sine, MD;  Location: [facility];  Service: Cardiovascular;  Laterality: N/A;  . MEMBRANE PEEL Right 10/23/2018   Procedure: MEMBRANE PEEL;  Surgeon: Hurman Horn, MD;  Location: [facility];  Service: Ophthalmology;  Laterality: Right;  . MI with stent placement  2004  . PARS PLANA VITRECTOMY Right  10/23/2018   Procedure: PARS PLANA VITRECTOMY WITH 25 GAUGE;  Surgeon: Hurman Horn, MD;  Location: [facility];  Service: Ophthalmology;  Laterality: Right;     Medications: Current Meds  Medication Sig  . acetaminophen (TYLENOL) 500 MG tablet Take 1,000 mg by mouth daily.  [name] albuterol (PROAIR HFA) 108 (90 Base) MCG/ACT inhaler Inhale 1-2 puffs into the lungs every 6 (six) hours as needed for wheezing or shortness of breath.  . anastrozole (ARIMIDEX) 1 MG tablet TAKE 1 TABLET BY MOUTH EVERY DAY  . aspirin 81 MG EC tablet Take 1 tablet (81 mg total) by mouth daily. Swallow whole.  [name] atorvastatin (LIPITOR) 80 MG tablet  TAKE 1 TABLET BY MOUTH EVERY DAY  . budesonide-formoterol (SYMBICORT) 160-4.5 MCG/ACT inhaler Inhale 2 puffs into the lungs 2 (two) times daily. For bronchitis  . buprenorphine (BUTRANS) 5 MCG/HR PTWK Place 1 patch onto the skin every 7 (seven) days.  . cetirizine (ZYRTEC) 10 MG tablet Take 1 tablet (10 mg total) by mouth daily.  . clopidogrel (PLAVIX) 75 MG tablet TAKE 1 TABLET BY MOUTH EVERY DAY WITH BREAKFAST  . Continuous Blood Gluc Sensor (FREESTYLE LIBRE 14 DAY SENSOR) MISC Inject 1 Device into the skin as directed.  . Cyanocobalamin (VITAMIN B-12 PO) Take 1 tablet by mouth once a week.   . DULoxetine (CYMBALTA) 60 MG capsule TAKE 1 CAPSULE BY MOUTH EVERY DAY FOR ANXIETY  . ergocalciferol (VITAMIN D2) 1.25 MG (50000 UT) capsule Take 1 capsule (50,000 Units total) by mouth once a week.  . febuxostat (ULORIC) 40 MG tablet TAKE 1 TABLET BY MOUTH EVERY DAY  . fluticasone (FLONASE) 50 MCG/ACT nasal spray Place 1 spray into both nostrils in the morning and at bedtime.  . furosemide (LASIX) 20 MG tablet TAKE 1 TABLET BY MOUTH EVERY DAY AS NEEDED  . hydrALAZINE (APRESOLINE) 50 MG tablet Take 1 tablet (50 mg total) by mouth 2 (two) times daily.  Derrill Memo ON 05/12/2020] HYDROcodone-acetaminophen (NORCO/VICODIN) 5-325 MG tablet Take 1 tablet by mouth daily as needed for severe pain. Must  last 7 days.  . hydrOXYzine (ATARAX/VISTARIL) 10 MG tablet Take 1 tablet by mouth three times daily as needed  . insulin aspart (NOVOLOG FLEXPEN) 100 UNIT/ML FlexPen Inject 16 Units into the skin 3 (three) times daily with meals. Max daily dose 70 units  . insulin glargine, 2 Unit Dial, (TOUJEO MAX SOLOSTAR) 300 UNIT/ML Solostar Pen Inject 38 Units into the skin daily. Patient assistance program provides  . isosorbide mononitrate (IMDUR) 60 MG 24 hr tablet TAKE 1 TABLET BY MOUTH EVERY DAY  . meclizine (ANTIVERT) 25 MG tablet TAKE 1 TABLET BY MOUTH THREE TIMES DAILY AS NEEDED FOR DIZZINESS  . memantine (NAMENDA) 5 MG tablet TAKE 2 TABLETS BY MOUTH EVERY DAY and TAKE 1 TABLET BY MOUTH EVERY DAY  . metoprolol succinate (TOPROL-XL) 50 MG 24 hr tablet Take 50 mg by mouth daily.  . nitroGLYCERIN (NITROSTAT) 0.4 MG SL tablet Take one tablet under the tongue every 5 minutes as needed for chest pain  . pantoprazole (PROTONIX) 40 MG tablet TAKE 1 TABLET BY MOUTH EVERY DAY  . pregabalin (LYRICA) 25 MG capsule Take 1 capsule (25 mg total) by mouth 2 (two) times daily.  . sodium polystyrene (SPS) 15 GM/60ML suspension TAKE 15 grams (60 mls) BY MOUTH ONCE WEEKLY TO keep potassium down  . topiramate (TOPAMAX) 25 MG tablet Take 1 tablet (25 mg total) by mouth daily.     Allergies: Allergies  Allergen Reactions  . Sulfonamide Derivatives Swelling    Mouth swelling  . Aricept [Donepezil Hcl] Diarrhea and Nausea And Vomiting    GI upset/loose stools  . Tramadol Nausea And Vomiting    Social History: The patient  reports that she has never smoked. She has never used smokeless tobacco. She reports that she does not drink alcohol and does not use drugs.   Family History: [name] family history includes Arthritis in her father and sister; Breast cancer (age of onset: 37) in her mother; Diabetes in her father and sister; Heart disease in her cousin and mother; Hypertension in her father; Obesity in her  sister; Throat cancer in her  father.   Review of Systems: Please see the history of present illness.   All other systems are reviewed and negative.   Physical Exam: VS:  BP 110/60   Pulse 83   Ht 5\' 7"  (1.702 m)   SpO2 97%   BMI 52.94 kg/m  .  BMI Body mass index is 52.94 kg/m.  Wt Readings from Last 3 Encounters:  04/12/20 (!) 338 lb (153.3 kg)  03/18/20 (!) 338 lb (153.3 kg)  03/03/20 (!) 338 lb (153.3 kg)    General: Morbidly obese.  She is in no acute distress. Wheelchair.   Cardiac: Regular rate and rhythm. No murmurs, rubs, or gallops. No edema.  Respiratory:  Lungs are clear to auscultation bilaterally with normal work of breathing.  GI: Soft and nontender.  MS: No deformity or atrophy. Gait not tested.  Skin: Warm and dry. Color is normal.  Neuro:  Strength and sensation are intact and no gross focal deficits noted.  Psych: Alert, appropriate and with normal affect.   LABORATORY DATA:  EKG:  EKG is not ordered today.    Lab Results  Component Value Date   WBC 11.3 (H) 03/03/2020   HGB 9.3 (L) 03/03/2020   HCT 32.5 (L) 03/03/2020   PLT 283 03/03/2020   GLUCOSE 254 (H) 03/03/2020   CHOL 117 15-Mar-2020   TRIG 85 Mar 15, 2020   HDL 40 (L) 03/15/2020   LDLCALC 60 03/15/20   ALT 6 02/23/2020   AST 10 (L) 02/23/2020   NA 138 03/03/2020   K 4.0 03/03/2020   CL 104 03/03/2020   CREATININE 1.98 (H) 03/03/2020   BUN 32 (H) 03/03/2020   CO2 23 03/03/2020   TSH 2.370 03/02/2020   INR 1.2 10/23/2018   HGBA1C 8.9 (A) 12/23/2019   MICROALBUR 2.9 06/14/2017     BNP (last 3 results) No results for input(s): BNP in the last 8760 hours.  ProBNP (last 3 results) No results for input(s): PROBNP in the last 8760 hours.   Other Studies Reviewed Today:  2D Echo 03/15/2020 1. Technically difficult echo with poor image quality.  2. Left ventricular ejection fraction, by estimation, is 65 to 70%. The  left ventricle has normal function. The left ventricle has no  regional  wall motion abnormalities. Left ventricular diastolic parameters are  consistent with Grade I diastolic  dysfunction (impaired relaxation).  3. Right ventricular systolic function was not well visualized. The right  ventricular size is not well visualized.  4. The mitral valve was not well visualized. No evidence of mitral valve  regurgitation.  5. The aortic valve was not well visualized. Aortic valve regurgitation  is not visualized.   Cath 95/18/84  LV end diastolic pressure is mildly elevated.  ------------------------------  Mid LM to Prox LAD lesion is 30% stenosed with 30% stenosed side branch in Ost Cx.  Prox LAD to Mid LAD lesion is 45% stenosed with 25% stenosed side branch in 1st Diag.  1st Mrg lesion is 100% stenosed.  ----------------  Colon Flattery RCA to Prox RCA stent is 10% stenosed.  Prox RCA to Mid RCA stent is 5% stenosed.  Prox RCA lesion is 95% stenosed, between the 2 old stents  A drug-eluting stent was successfully placed using a STENT RESOLUTE ONYX 4.0X18 -> postdilated to 4.6-4.7 mm  Post intervention, there is a 0% residual stenosis.  ----------------  Mid RCA lesion is 85% stenosed after the second stent  A drug-eluting stent was successfully placed using a STENT RESOLUTE ONYX 3.0X18 ->  tapered post dilation from 4.2-3.3 mm  Post intervention, there is a 0% residual stenosis.  SUMMARY  Multivessel CAD with severe (95%) IntraStent stenosis in the proximal RCA and de novo stenosis distal to mid RCA stent (85%) -> both treated with DES PCI using Resolute Onyx DES stents (distal-3.0 mm x 18 mm with tapered post dilation 4.2-3.3 mm; proximal 4.0 mm 18 mm postdilated to 4.7 mm.)  Diffuse mild to moderate calcified disease in the distal Left Main as well as ostial and proximal LCx and LAD with no severe stenoses.  Borderline elevated LVEDP with severe systemic hypertension   RECOMMENDATIONS  Return to nursing unit for ongoing care.  Okay to DC heparin.  We will need to determine plus or minus the use of Xarelto plus Plavix. For now would continue aspirin plus Plavix until discharge and then discontinue aspirin on discharge with Xarelto plus Plavix for 3-6 months.  Continue to monitor anemia and other risk factors.    ASSESSMENT & PLAN:   1. CAD - prior NSTEMI back in October - s/p PCI to the RCA - she is on DAPT with aspirin and Plavix - off Xarelto per Oncology. No worrisome symptoms but overall prognosis is quite tenuous at best.    2. Sinus tach - not noted today. HR is fine.   3. Morbid obesity - this is not going to change - unfortunately, she is getting more and more debilitated.   4. HLD - on statin therapy  5. Chronic anemia - no active bleeding noted.   6. History of PE/DVT - no longer on Xarelto due to anemia. Unfortunately, probably at high risk for recurrence given her sedentary nature.   7. Chronic diastolic HF - symptoms seem stable but tenuous prognosis.   8. CKD   9. Dementia - quite conversive today - she seems to understand that her predicament is challenging.   We have discussed the option of Palliative Care - she will talk with her PCP tomorrow at that visit - would benefit from Social Work seeing (not sure if this has been done). Her family is getting to the point that they are not going to be able to provide her care for much longer.   Current medicines are reviewed with the patient today.  The patient does not have concerns regarding medicines other than what has been noted above.  The following changes have been made:  See above.  Labs/ tests ordered today include:   No orders of the defined types were placed in this encounter.    Disposition:   FU with Dr. Meda Coffee in 6 months. No change from our standpoint - she sees PCP tomorrow and will talk about possible Palliative Care/Social work referral.      Patient is agreeable to this plan and will call if any problems develop  in the interim.   SignedTruitt Merle, NP  04/19/2020 2:26 PM  [facility] [address] [place], [place]  [address] Phone: [phone] Fax: [phone]

## 2020-04-09 ENCOUNTER — Telehealth: Payer: Self-pay | Admitting: *Deleted

## 2020-04-09 DIAGNOSIS — N39498 Other specified urinary incontinence: Secondary | ICD-10-CM

## 2020-04-09 NOTE — Telephone Encounter (Signed)
Patient called requesting a written order to submit to her insurance for a PureWick. Stated that she is unable to get up from the bed to go to the bathroom and it is placed on her vagina for urination.  Stated that it has been advertised on TV and the Hospital used it when she was in the hospital and it worked well.  She is requesting an order to see if her insurance will cover this.  Please Advise.

## 2020-04-12 ENCOUNTER — Ambulatory Visit
Payer: PPO | Attending: Student in an Organized Health Care Education/Training Program | Admitting: Student in an Organized Health Care Education/Training Program

## 2020-04-12 ENCOUNTER — Other Ambulatory Visit: Payer: Self-pay

## 2020-04-12 ENCOUNTER — Telehealth: Payer: Self-pay

## 2020-04-12 ENCOUNTER — Encounter: Payer: Self-pay | Admitting: Student in an Organized Health Care Education/Training Program

## 2020-04-12 VITALS — BP 122/50 | HR 78 | Temp 97.1°F | Resp 16 | Ht 67.0 in | Wt 338.0 lb

## 2020-04-12 DIAGNOSIS — G8929 Other chronic pain: Secondary | ICD-10-CM | POA: Diagnosis not present

## 2020-04-12 DIAGNOSIS — M48062 Spinal stenosis, lumbar region with neurogenic claudication: Secondary | ICD-10-CM | POA: Insufficient documentation

## 2020-04-12 DIAGNOSIS — Z0289 Encounter for other administrative examinations: Secondary | ICD-10-CM | POA: Diagnosis not present

## 2020-04-12 DIAGNOSIS — G894 Chronic pain syndrome: Secondary | ICD-10-CM | POA: Diagnosis not present

## 2020-04-12 DIAGNOSIS — M47816 Spondylosis without myelopathy or radiculopathy, lumbar region: Secondary | ICD-10-CM | POA: Diagnosis not present

## 2020-04-12 DIAGNOSIS — M5416 Radiculopathy, lumbar region: Secondary | ICD-10-CM | POA: Diagnosis not present

## 2020-04-12 DIAGNOSIS — M170 Bilateral primary osteoarthritis of knee: Secondary | ICD-10-CM | POA: Insufficient documentation

## 2020-04-12 MED ORDER — HYDROCODONE-ACETAMINOPHEN 5-325 MG PO TABS: 1.0000 | ORAL_TABLET | Freq: Every day | ORAL | 0 refills | Status: DC | PRN

## 2020-04-12 MED ORDER — BUPRENORPHINE 5 MCG/HR TD PTWK: 1.0000 | MEDICATED_PATCH | TRANSDERMAL | 1 refills | Status: DC

## 2020-04-12 NOTE — Progress Notes (Signed)
Safety precautions to be maintained throughout the outpatient stay will include: orient to surroundings, keep bed in low position, maintain call bell within reach at all times, provide assistance with transfer out of bed and ambulation.  

## 2020-04-12 NOTE — Telephone Encounter (Signed)
Order signed.

## 2020-04-12 NOTE — Progress Notes (Signed)
PROVIDER NOTE: Information contained herein reflects review and annotations entered in association with encounter. Interpretation of such information and data should be left to medically-trained personnel. Information provided to patient can be located elsewhere in the medical record under "Patient Instructions". Document created using STT-dictation technology, any transcriptional errors that may result from process are unintentional.    Patient: [name]  Service Category: E/M  Provider: Gillis Santa, MD  DOB: [birth year]  DOS: 04/12/2020  Specialty: Interventional Pain Management  MRN: [id number]  Setting: Ambulatory outpatient  PCP: Nichole Chandler, NP  Type: Established Patient    Referring Provider: [name], NP  Location: Office  Delivery: Face-to-face     Primary Reason(s) for Visit: Encounter for evaluation before starting new chronic pain management plan of care (Level of risk: moderate) CC: Headache and Joint Pain  HPI  [name] is a 85 y.o. year old, female patient, who comes today for a follow-up evaluation to review the test results and decide on a treatment plan. She has Fungal dermatitis; Hyperlipidemia; Morbid (severe) obesity due to excess calories ([name]); Microcytic anemia; TENSION HEADACHE; Situational depression; Essential hypertension; CAD (coronary artery disease); Headache; ORTHOPNEA; URINARY INCONTINENCE; Personal history of other diseases of the digestive system; Abdominal pain; History of deep venous thrombosis; Chronic diastolic congestive heart failure ([name]); Long term current use of anticoagulant therapy; Dysphagia, unspecified(787.20); GERD (gastroesophageal reflux disease); Debility; Osteoarthritis; Insomnia; Need for prophylactic vaccination and inoculation against influenza; OSA (obstructive sleep apnea); Type 2 diabetes mellitus with diabetic foot deformity ([name]); Pain in the chest; Estrogen deficiency; Breast cancer screening; Weight gain; COPD (chronic  obstructive pulmonary disease) ([name]); History of pulmonary embolus (PE); CN (constipation); DOE (dyspnea on exertion); Chronic bronchitis ([name]); Vertigo; Encounter for therapeutic drug monitoring; Gout; Breast mass; Morbid obesity with BMI of 50.0-59.9, adult ([name]); CKD (chronic kidney disease) stage 3, GFR 30-59 ml/min ([name]); Dyslipidemia associated with type 2 diabetes mellitus ([name]); Type 2 diabetes mellitus with stage 3b chronic kidney disease, with long-term current use of insulin ([name]); Chronic pain syndrome; Breast cancer ([name]); Vitamin D deficiency; Cough variant asthma; Severe episode of recurrent major depressive disorder, without psychotic features ([name]); Endometrial polyp; Vaginitis and vulvovaginitis; Esophageal dysphagia; GERD with esophagitis; Memory loss; Multiple benign nevi; Anxiety associated with depression; Hyperkalemia; Type 2 diabetes mellitus with diabetic neuropathy, with long-term current use of insulin ([name]); Type 2 diabetes mellitus with hyperglycemia, with long-term current use of insulin ([name]); Pulmonary embolus ([name]); Moderate nonproliferative diabetic retinopathy of both eyes without macular edema associated with type 2 diabetes mellitus ([name]); Chest pain on exertion; Presence of stent in right coronary artery; Chronic radicular lumbar pain; Lumbar facet arthropathy; Spinal stenosis, lumbar region, with neurogenic claudication; Bilateral primary osteoarthritis of knee; and Pain management contract signed on their problem list. Her primarily concern today is the Headache and Joint Pain  Pain Assessment: Location: Left Head (headache and Joint Pain) Radiating:   Onset: More than a month ago Duration: Chronic pain Quality: Aching Severity: 8 /10 (subjective, self-reported pain score)  Effect on ADL: limits daly activity Timing: Intermittent Modifying factors: nothing is helping right now BP: (!) 122/50  HR: 78  No change in history since the patient's initial visit with me  on 03/18/2020.  She presents today for her second patient visit.  Please see below for HPI from 03/18/20: [name] is a pleasant 85 year old female with history of morbid obesity, significant cardiac history, type 2 diabetes with neuropathic pain without presents with chronic pain of multiple regions.  She endorses neck pain, shoulder pain, bilateral hip pain, bilateral knee pain as well as low back pain.  Patient is accompanied today by her daughter.  Patient lives with her daughter.  Patient is not very functional.  She essentially goes from the bed at night to the recliner during the day.  She does not ambulate very much.  Her current BMI is 53.  She weighs 338 pounds.  She has tried to do physical therapy in the past but it was not helpful for her pain.  She has a history of recent MI with percutaneous coronary intervention with stent placement.  She is on dual antiplatelet therapy with aspirin and Plavix.  Patient was recently started on Lyrica 25 mg twice a day last month.  This was started by her primary care provider.  Do not recommend NSAID therapy given her intake of aspirin and Plavix.  Patient also has chronic kidney disease.  She is on Cymbalta 60 mg daily as well.  She is currently not on any opioid analgesics.  She states that she did take oxycodone in the past which did help out with her pain.  A&P from 03/28/20  I had extensive discussion with [name] as well as her daughter about treatment plan.  I was very honest with them inform them that we have limited treatment options in the context of her core morbidities which include cardiovascular disease, chronic kidney disease, type 2 diabetes with neuropathy as well as cardiomyopathy.  Do not recommend NSAID in the context of her being on dual antiplatelet therapy.  Patient is on Cymbalta which is reasonable for pain management and mood improvement.  She was also started on Lyrica 25 mg twice a day.  This is being managed by her primary care  provider.  This can be titrated up to 50 mg twice a day if patient is tolerating this well.  This medication should be renally dosed based upon the patient's GFR.  In regards to interventional therapies which is my expertise, patient is high risk to be off of her dual antiplatelet therapy specifically her Plavix given her most recent coronary stent placement.  In regards to opioid medications, I expressed my concerns regarding the risk in the context of [name]'s morbid obesity, symptoms concerning for obstructive sleep apnea as well as symptoms of dizziness and imbalance reported and previous medical notes.  I informed the patient that opioid analgesics can increase her risk of dizziness, [name], cognitive dysfunction, immune dysfunction, constipation, nausea.  She does have fairly limited options.  I will obtain a urine toxicology screen today.  Patient will follow up in 2 weeks after results are in at which point we can discuss low-dose opioid therapy or even buprenorphine for pain management.  I am reluctant to prescribe a controlled 2 for her however she has failed tramadol in the past.  Can see if insurance will cover buprenorphine which has less side effect potential for respiratory depression and dizziness then most controlled to opioids.  Patient endorsed understanding.  Controlled Substance Pharmacotherapy Assessment REMS (Risk Evaluation and Mitigation Strategy)  Analgesic: Start buprenorphine transdermal patch at 5 mcg an hour, hydrocodone 5 mg as needed for breakthrough pain.  Not to exceed 2 tablets in a given day.  Pill Count: None expected due to no prior prescriptions written by our practice.  Monitoring: [place] PMP: PDMP reviewed during this encounter. Online review of the past 44-monthperiod previously conducted. Not applicable at this point since we have not  taken over the patient's medication management yet. List of other Serum/Urine Drug Screening Test(s):  Lab Results  Component  Value Date   AMPHSCRSER Negative 03/18/2020   BARBSCRSER Negative 03/18/2020   BENZOSCRSER Negative 03/18/2020   COCAINSCRSER Negative 03/18/2020   COCAINSCRNUR negative 03/31/2019   PCPSCRSER Negative 03/18/2020   THCSCRSER Negative 03/18/2020   OPIATESCRSER Negative 03/18/2020   OXYSCRSER Negative 03/18/2020   PROPOXSCRSER Negative 03/18/2020   List of all UDS test(s) done:  No results found for: TOXASSSELUR, SUMMARY Last UDS on record: No results found for: TOXASSSELUR, SUMMARY UDS interpretation: No unexpected findings.          Medication Assessment Form: Patient introduced to form today Treatment compliance: Treatment may start today if patient agrees with proposed plan. Evaluation of compliance is not applicable at this point Risk Assessment Profile: Aberrant behavior: See initial evaluations. None observed or detected today Comorbid factors increasing risk of overdose: See initial evaluation. No additional risks detected today Opioid risk tool (ORT):  Opioid Risk  03/18/2020  Alcohol 0  Illegal Drugs 0  Rx Drugs 0  Alcohol 0  Illegal Drugs 0  Rx Drugs 0  Age between 16-45 years  0  History of Preadolescent Sexual Abuse 0  Psychological Disease 0  Depression 1  Opioid Risk Tool Scoring 1  Opioid Risk Interpretation Low Risk    ORT Scoring interpretation table:  Score <3 = Low Risk for SUD  Score between 4-7 = Moderate Risk for SUD  Score >8 = High Risk for Opioid Abuse   Risk of substance use disorder (SUD): Low  Risk Mitigation Strategies:  Patient opioid safety counseling: Completed today. Counseling provided to patient as per "Patient Counseling Document". Document signed by patient, attesting to counseling and understanding Patient-Prescriber Agreement (PPA): Obtained today.  Controlled substance notification to other providers: Written and sent today.  Pharmacologic Plan: Today we may be taking over the patient's pharmacological regimen. See below.              Laboratory Chemistry Profile   Renal Lab Results  Component Value Date   BUN 32 (H) 03/03/2020   CREATININE 1.98 (H) 03/03/2020   LABCREA 36 06/14/2017   BCR 20 03/02/2020   GFR 49.91 (L) 07/31/2012   GFRAA 38 (L) 03/02/2020   GFRNONAA 25 (L) 03/03/2020   SPECGRAV 1.015 06/03/2019   PHUR 6.0 06/03/2019   PROTEINUR Negative 06/03/2019     Electrolytes Lab Results  Component Value Date   NA 138 03/03/2020   K 4.0 03/03/2020   CL 104 03/03/2020   CALCIUM 8.6 (L) 03/03/2020   MG 1.4 (L) 10/24/2017   PHOS 3.7 10/09/2017     Hepatic Lab Results  Component Value Date   AST 10 (L) 02/23/2020   ALT 6 02/23/2020   ALBUMIN 2.9 (L) 02/23/2020   ALKPHOS 84 02/23/2020   LIPASE 39 08/27/2018     ID Lab Results  Component Value Date   SARSCOV2NAA NEGATIVE 02/14/2020   MRSAPCR NEGATIVE 04/21/2014     Bone Lab Results  Component Value Date   VD25OH 21.76 (L) 07/24/2019     Endocrine Lab Results  Component Value Date   GLUCOSE 254 (H) 03/03/2020   GLUCOSEU >=500 (A) 01/29/2019   HGBA1C 8.9 (A) 12/23/2019   TSH 2.370 03/02/2020     Neuropathy Lab Results  Component Value Date   VITAMINB12 677 02/23/2020   FOLATE 6.7 10/09/2017   HGBA1C 8.9 (A) 12/23/2019     CNS No results  found for: COLORCSF, APPEARCSF, RBCCOUNTCSF, WBCCSF, POLYSCSF, LYMPHSCSF, EOSCSF, PROTEINCSF, GLUCCSF, JCVIRUS, CSFOLI, IGGCSF, LABACHR, ACETBL, LABACHR, ACETBL   Inflammation (CRP: Acute  ESR: Chronic) Lab Results  Component Value Date   CRP 1.0 (H) 01/30/2019   ESRSEDRATE 110 (H) 09/25/2014   LATICACIDVEN 1.5 01/25/2019     Rheumatology Lab Results  Component Value Date   LABURIC 8.6 (H) 12/03/2014     Coagulation Lab Results  Component Value Date   INR 1.2 10/23/2018   LABPROT 15.4 (H) 10/23/2018   APTT 125 (H) 02/16/2020   PLT 283 03/03/2020   DDIMER 5.49 (H) 03/02/2020     Cardiovascular Lab Results  Component Value Date   BNP 21.0 01/25/2019   CKTOTAL 167  07/28/2017   CKMB 8.2 (HH) 01/26/2012   TROPONINI <0.03 08/27/2018   HGB 9.3 (L) 03/03/2020   HCT 32.5 (L) 03/03/2020     Screening Lab Results  Component Value Date   SARSCOV2NAA NEGATIVE 02/14/2020   COVIDSOURCE NASOPHARYNGEAL 10/19/2018   MRSAPCR NEGATIVE 04/21/2014     Cancer No results found for: CEA, CA125, LABCA2   Allergens No results found for: ALMOND, APPLE, ASPARAGUS, AVOCADO, BANANA, BARLEY, BASIL, BAYLEAF, GREENBEAN, LIMABEAN, WHITEBEAN, BEEFIGE, REDBEET, BLUEBERRY, BROCCOLI, CABBAGE, MELON, CARROT, CASEIN, CASHEWNUT, CAULIFLOWER, CELERY     Note: Lab results reviewed.  Recent Diagnostic Imaging Review   Narrative CLINICAL DATA:  Left-sided neck and shoulder pain for 1 year, no known injury, initial encounter  EXAM: CERVICAL SPINE - COMPLETE 4+ VIEW  COMPARISON:  None.  FINDINGS: Seven cervical segments are visualized. No compression deformity is seen. Mild osteophytic changes are noted. Multilevel facet hypertrophic changes are seen. No prevertebral soft tissue abnormality is noted. Carotid calcifications are seen. The neural foramina are patent. No other focal abnormality is noted.  IMPRESSION: Multilevel degenerative change without acute abnormality.   Electronically Signed By: Inez Catalina M.D. On: 02/03/2020 08:08  Narrative CLINICAL DATA:  Preoperative radioactive seed localization after 2 left breast biopsies demonstrating duct ectasia. Per previous discussion with Dr. Ninfa Linden, this seed is to be placed between the 2 previously placed left retroareolar clips.  EXAM: MAMMOGRAPHIC GUIDED RADIOACTIVE SEED LOCALIZATION OF THE left BREAST  COMPARISON:  Previous exam(s).  FINDINGS: Patient presents for radioactive seed localization prior to excisional biopsy. I met with the patient and we discussed the procedure of seed localization including benefits and alternatives. We discussed the high likelihood of a successful procedure.  We discussed the risks of the procedure including infection, bleeding, tissue injury and further surgery. We discussed the low dose of radioactivity involved in the procedure. Informed, written consent was given.  The usual time-out protocol was performed immediately prior to the procedure.  Using mammographic guidance, sterile technique, 2% lidocaine and an I-125 radioactive seed, the space between 2 left breast retroareolar clips was localized using a superior to inferior approach. The follow-up mammogram images confirm the seed in the expected location and were marked for Dr. Ninfa Linden.  Follow-up survey of the patient confirms presence of the radioactive seed.  Order number of I-125 seed:  [id number].  Total activity:  0.250 mCi  Reference Date: 10/23/2014  The patient tolerated the procedure well and was released from the [facility]. She was given instructions regarding seed removal.  IMPRESSION: Radioactive seed localization left breast. No apparent complications.   Electronically Signed By: Conchita Paris M.D. On: 11/04/2014 13:53  DG HIP UNILAT WITH PELVIS 2-3 VIEWS RIGHT  Narrative CLINICAL DATA:  Fall out of bed last night.  Right hip pain.  EXAM: DG HIP (WITH OR WITHOUT PELVIS) 2-3V RIGHT  COMPARISON:  None.  FINDINGS: Moderate degenerative changes in the hips bilaterally with joint space narrowing and spurring. No acute bony abnormality. Specifically, no fracture, subluxation, or dislocation. Soft tissues are intact.  IMPRESSION: No acute bony abnormality.   Electronically Signed By: Rolm Baptise M.D. On: 03/20/2016 12:50  DG Knee Complete 4 Views Right  Narrative CLINICAL DATA:  [place] out of bed last night.  Right leg pain.  EXAM: RIGHT KNEE - COMPLETE 4+ VIEW  COMPARISON:  None.  FINDINGS: Advanced degenerative changes in the right knee, most pronounced in the medial and patellofemoral compartments with joint space loss  and spurring. No acute bony abnormality. Specifically, no fracture, subluxation, or dislocation. Soft tissues are intact. No joint effusion.  IMPRESSION: Advanced tricompartment degenerative changes. No acute bony abnormality.   Electronically Signed By: Rolm Baptise M.D. On: 03/20/2016 12:50  Complexity Note: Imaging results reviewed. Results shared with [name], using Layman's terms.                         Meds   Current Outpatient Medications:  .  acetaminophen (TYLENOL) 500 MG tablet, Take 1,000 mg by mouth daily., Disp: , Rfl:  .  albuterol (PROAIR HFA) 108 (90 Base) MCG/ACT inhaler, Inhale 1-2 puffs into the lungs every 6 (six) hours as needed for wheezing or shortness of breath., Disp: 6.7 g, Rfl: 1 .  anastrozole (ARIMIDEX) 1 MG tablet, TAKE 1 TABLET BY MOUTH EVERY DAY, Disp: 90 tablet, Rfl: 1 .  aspirin 81 MG EC tablet, Take 1 tablet (81 mg total) by mouth daily. Swallow whole., Disp: 30 tablet, Rfl: 1 .  atorvastatin (LIPITOR) 80 MG tablet, Take 1 tablet (80 mg total) by mouth daily., Disp: 30 tablet, Rfl: 1 .  budesonide-formoterol (SYMBICORT) 160-4.5 MCG/ACT inhaler, Inhale 2 puffs into the lungs 2 (two) times daily. For bronchitis, Disp: 1 Inhaler, Rfl: 3 .  cetirizine (ZYRTEC) 10 MG tablet, Take 1 tablet (10 mg total) by mouth daily., Disp: 30 tablet, Rfl: 11 .  clopidogrel (PLAVIX) 75 MG tablet, Take 1 tablet (75 mg total) by mouth daily with breakfast., Disp: 30 tablet, Rfl: 1 .  Continuous Blood Gluc Sensor (FREESTYLE LIBRE 14 DAY SENSOR) MISC, Inject 1 Device into the skin as directed., Disp: 6 each, Rfl: 3 .  Cyanocobalamin (VITAMIN B-12 PO), Take 1 tablet by mouth once a week. , Disp: , Rfl:  .  DULoxetine (CYMBALTA) 60 MG capsule, TAKE 1 CAPSULE BY MOUTH EVERY DAY FOR ANXIETY, Disp: 90 capsule, Rfl: 1 .  ergocalciferol (VITAMIN D2) 1.25 MG (50000 UT) capsule, Take 1 capsule (50,000 Units total) by mouth once a week., Disp: 12 capsule, Rfl: 3 .  febuxostat  (ULORIC) 40 MG tablet, TAKE 1 TABLET BY MOUTH EVERY DAY, Disp: 90 tablet, Rfl: 1 .  fluticasone (FLONASE) 50 MCG/ACT nasal spray, Place 1 spray into both nostrils in the morning and at bedtime., Disp: 11.1 mL, Rfl: 3 .  furosemide (LASIX) 20 MG tablet, TAKE 1 TABLET BY MOUTH EVERY DAY AS NEEDED, Disp: 30 tablet, Rfl: 6 .  hydrALAZINE (APRESOLINE) 50 MG tablet, Take 1 tablet (50 mg total) by mouth 2 (two) times daily., Disp: 60 tablet, Rfl: 3 .  hydrOXYzine (ATARAX/VISTARIL) 10 MG tablet, Take 1 tablet by mouth three times daily as needed, Disp: 90 tablet, Rfl: 1 .  insulin aspart (NOVOLOG FLEXPEN) 100 UNIT/ML FlexPen, Inject  16 Units into the skin 3 (three) times daily with meals. Max daily dose 70 units, Disp: 63 mL, Rfl: 3 .  insulin glargine, 2 Unit Dial, (TOUJEO MAX SOLOSTAR) 300 UNIT/ML Solostar Pen, Inject 38 Units into the skin daily. Patient assistance program provides, Disp: 15 mL, Rfl: 6 .  isosorbide mononitrate (IMDUR) 60 MG 24 hr tablet, TAKE 1 TABLET BY MOUTH EVERY DAY, Disp: 90 tablet, Rfl: 1 .  meclizine (ANTIVERT) 25 MG tablet, TAKE 1 TABLET BY MOUTH THREE TIMES DAILY AS NEEDED FOR DIZZINESS, Disp: 90 tablet, Rfl: 1 .  memantine (NAMENDA) 5 MG tablet, TAKE 2 TABLETS BY MOUTH EVERY MORNING and TAKE 1 TABLET EVERY EVENING FOR memory, Disp: 90 tablet, Rfl: 5 .  metoprolol succinate (TOPROL-XL) 50 MG 24 hr tablet, Take 50 mg by mouth daily., Disp: , Rfl:  .  nitroGLYCERIN (NITROSTAT) 0.4 MG SL tablet, Take one tablet under the tongue every 5 minutes as needed for chest pain, Disp: 50 tablet, Rfl: 0 .  pantoprazole (PROTONIX) 40 MG tablet, TAKE 1 TABLET BY MOUTH EVERY DAY, Disp: 90 tablet, Rfl: 1 .  pregabalin (LYRICA) 25 MG capsule, Take 1 capsule (25 mg total) by mouth 2 (two) times daily., Disp: 60 capsule, Rfl: 3 .  sodium polystyrene (SPS) 15 GM/60ML suspension, TAKE 15 grams (60 mls) BY MOUTH ONCE WEEKLY TO keep potassium down, Disp: 473 mL, Rfl: 2 .  topiramate (TOPAMAX) 25 MG  tablet, Take 1 tablet (25 mg total) by mouth daily., Disp: 90 tablet, Rfl: 1 .  buprenorphine (BUTRANS) 5 MCG/HR PTWK, Place 1 patch onto the skin every 7 (seven) days., Disp: 4 patch, Rfl: 1 .  HYDROcodone-acetaminophen (NORCO/VICODIN) 5-325 MG tablet, Take 1 tablet by mouth daily as needed for severe pain. Must last 7 days., Disp: 30 tablet, Rfl: 0 .  [START ON 05/12/2020] HYDROcodone-acetaminophen (NORCO/VICODIN) 5-325 MG tablet, Take 1 tablet by mouth daily as needed for severe pain. Must last 7 days., Disp: 30 tablet, Rfl: 0  ROS  Constitutional: Denies any fever or chills Gastrointestinal: No reported hemesis, hematochezia, vomiting, or acute GI distress Musculoskeletal: Denies any acute onset joint swelling, redness, loss of ROM, or weakness Neurological: No reported episodes of acute onset apraxia, aphasia, dysarthria, agnosia, amnesia, paralysis, loss of coordination, or loss of consciousness  Allergies  [name] is allergic to sulfonamide derivatives, aricept [donepezil hcl], and tramadol.  [place]  Drug: [name]  reports no history of drug use. Alcohol:  reports no history of alcohol use. Tobacco:  reports that she has never smoked. She has never used smokeless tobacco. Medical:  has a past medical history of Allergy, Anemia, unspecified, Anxiety, Arthritis, Breast cancer ([place]), Chronic diastolic CHF (congestive heart failure) ([place]), CKD (chronic kidney disease), stage III ([place]), Coronary artery disease, Dementia ([place]), Depression, Diabetes mellitus, GERD (gastroesophageal reflux disease), Gout, unspecified, Headache, History of blood clots, Hyperlipidemia, Hypertension, Insomnia, Joint pain, Joint swelling, Morbid obesity ([place]), Myocardial infarction ([place]) (2004), Non-STEMI (non-ST elevated myocardial infarction) ([place]) (02/15/2020), Numbness, Obstructive sleep apnea, Osteoarthritis, Osteoarthritis, Osteoarthrosis, unspecified whether generalized or localized, lower leg, Pain, chronic,  Polymyalgia rheumatica ([place]), Pulmonary emboli ([place]) (01/2012), Urinary incontinence, Vertigo, and Wears dentures. Surgical: [name]  has a past surgical history that includes Appendectomy; Cardiac catheterization; Gastric bypass (1977 ); blood clots/legs and lungs (2013); MI with stent placement (2004); left heart catheterization with coronary angiogram (N/A, 06/29/2014); Coronary angioplasty (2); Eye surgery (Bilateral); Colonoscopy; Abdominal hysterectomy; Breast lumpectomy with radioactive seed localization (Left, 11/05/2014); Excision of skin tag (  Right, 11/05/2014); Breast lumpectomy (Left, 11/05/2014); Breast biopsy (Left, 07/22/2014); Breast biopsy (Left, 02/10/2013); Esophagogastroduodenoscopy (egd) with propofol (N/A, 11/07/2016); Pars plana vitrectomy (Right, 10/23/2018); Membrane peel (Right, 10/23/2018); LEFT HEART CATH AND CORONARY ANGIOGRAPHY (N/A, 02/17/2020); and CORONARY STENT INTERVENTION (N/A, 02/17/2020). Family: family history includes Arthritis in her father and sister; Breast cancer (age of onset: 37) in her mother; Diabetes in her father and sister; Heart disease in her cousin and mother; Hypertension in her father; Obesity in her sister; Throat cancer in her father.  Constitutional Exam  General appearance: Well nourished, well developed, and well hydrated. In no apparent acute distress Vitals:   04/12/20 1135  BP: (!) 122/50  Pulse: 78  Resp: 16  Temp: (!) 97.1 F (36.2 C)  TempSrc: Temporal  SpO2: 98%  Weight: (!) 338 lb (153.3 kg)  Height: '5\' 7"'  (1.702 m)   BMI Assessment: Estimated body mass index is 52.94 kg/m as calculated from the following:   Height as of this encounter: '5\' 7"'  (1.702 m).   Weight as of this encounter: 338 lb (153.3 kg).  BMI interpretation table: BMI level Category Range association with higher incidence of chronic pain  <18 kg/m2 Underweight   18.5-24.9 kg/m2 Ideal body weight   25-29.9 kg/m2 Overweight Increased incidence by 20%  30-34.9  kg/m2 Obese (Class I) Increased incidence by 68%  35-39.9 kg/m2 Severe obesity (Class II) Increased incidence by 136%  >40 kg/m2 Extreme obesity (Class III) Increased incidence by 254%   Patient's current BMI Ideal Body weight  Body mass index is 52.94 kg/m. Ideal body weight: 61.6 kg (135 lb 12.9 oz) Adjusted ideal body weight: 98.3 kg (216 lb 10.9 oz)   BMI Readings from Last 4 Encounters:  04/12/20 52.94 kg/m  03/18/20 52.94 kg/m  03/03/20 52.94 kg/m  03/02/20 52.94 kg/m   Wt Readings from Last 4 Encounters:  04/12/20 (!) 338 lb (153.3 kg)  03/18/20 (!) 338 lb (153.3 kg)  03/03/20 (!) 338 lb (153.3 kg)  03/02/20 (!) 338 lb (153.3 kg)    Psych/Mental status: Alert, oriented x 3 (person, place, & time)       Eyes: PERLA Respiratory: No evidence of acute respiratory distress  Cervical Spine Exam  Skin & Axial Inspection: No masses, redness, edema, swelling, or associated skin lesions Alignment: Symmetrical Functional ROM: Decreased ROM      Stability: No instability detected Muscle Tone/Strength: Functionally intact. No obvious neuro-muscular anomalies detected. Sensory (Neurological): Musculoskeletal pain pattern Palpation: No palpable anomalies                    Upper Extremity (UE) Exam    Side: Right upper extremity  Side: Left upper extremity   Skin & Extremity Inspection: Skin color, temperature, and hair growth are WNL. No peripheral edema or cyanosis. No masses, redness, swelling, asymmetry, or associated skin lesions. No contractures.  Skin & Extremity Inspection: Skin color, temperature, and hair growth are WNL. No peripheral edema or cyanosis. No masses, redness, swelling, asymmetry, or associated skin lesions. No contractures.   Functional ROM: Pain restricted ROM for all joints of upper extremity  Functional ROM: Pain restricted ROM for all joints of upper extremity   Muscle Tone/Strength: Functionally intact. No obvious neuro-muscular anomalies  detected.   Muscle Tone/Strength: Functionally intact. No obvious neuro-muscular anomalies detected.   Sensory (Neurological): Musculoskeletal pain pattern          Sensory (Neurological): Musculoskeletal pain pattern           Palpation:  No palpable anomalies              Palpation: No palpable anomalies               Provocative Test(s):  Phalen's test: deferred Tinel's test: deferred Apley's scratch test (touch opposite shoulder):  Action 1 (Across chest): deferred Action 2 (Overhead): deferred Action 3 (LB reach): deferred   Provocative Test(s):  Phalen's test: deferred Tinel's test: deferred Apley's scratch test (touch opposite shoulder):  Action 1 (Across chest): deferred Action 2 (Overhead): deferred Action 3 (LB reach): deferred     Thoracic Spine Area Exam  Skin & Axial Inspection: No masses, redness, or swelling Alignment: Symmetrical Functional ROM: Pain restricted ROM Stability: No instability detected Muscle Tone/Strength: Functionally intact. No obvious neuro-muscular anomalies detected. Sensory (Neurological): Unimpaired Muscle strength & Tone: No palpable anomalies  Lumbar Exam  Skin & Axial Inspection: No masses, redness, or swelling Alignment: Symmetrical Functional ROM: Pain restricted ROM       Stability: No instability detected Muscle Tone/Strength: Functionally intact. No obvious neuro-muscular anomalies detected. Sensory (Neurological): Musculoskeletal pain pattern Palpation: No palpable anomalies       Provocative Tests: Hyperextension/rotation test: Unable to perform       Lumbar quadrant test (Kemp's test): Unable to perform       Lateral bending test: Unable to perform       Patrick's Maneuver: Unable to perform                    Ambulation: Patient came in today in a wheel chair Gait: Modified gait pattern (slower gait speed, wider stride width, and longer stance duration) associated with morbid obesity Posture: Difficulty standing up  straight, due to pain   Lower Extremity Exam    Side: Right lower extremity  Side: Left lower extremity  Stability: No instability observed          Stability: No instability observed          Skin & Extremity Inspection: Skin color, temperature, and hair growth are WNL. No peripheral edema or cyanosis. No masses, redness, swelling, asymmetry, or associated skin lesions. No contractures.  Skin & Extremity Inspection: Skin color, temperature, and hair growth are WNL. No peripheral edema or cyanosis. No masses, redness, swelling, asymmetry, or associated skin lesions. No contractures.  Functional ROM: Pain restricted ROM for all joints of the lower extremity          Functional ROM: Pain restricted ROM for all joints of the lower extremity          Muscle Tone/Strength: Functionally intact. No obvious neuro-muscular anomalies detected.  Muscle Tone/Strength: Functionally intact. No obvious neuro-muscular anomalies detected.  Sensory (Neurological): Neurogenic pain pattern        Sensory (Neurological): Neurogenic pain pattern        DTR: Patellar: deferred today Achilles: deferred today Plantar: deferred today  DTR: Patellar: deferred today Achilles: deferred today Plantar: deferred today  Palpation: No palpable anomalies  Palpation: No palpable anomalies     Assessment & Plan  Primary Diagnosis & Pertinent Problem List: The primary encounter diagnosis was Chronic radicular lumbar pain. Diagnoses of Lumbar facet arthropathy, Lumbar spondylosis, Spinal stenosis, lumbar region, with neurogenic claudication, Bilateral primary osteoarthritis of knee, Chronic pain syndrome, and Pain management contract signed were also pertinent to this visit.  Visit Diagnosis: 1. Chronic radicular lumbar pain   2. Lumbar facet arthropathy   3. Lumbar spondylosis   4. Spinal stenosis, lumbar region, with neurogenic  claudication   5. Bilateral primary osteoarthritis of knee   6. Chronic pain  syndrome   7. Pain management contract signed    Problems updated and reviewed during this visit: Problem  Bilateral Primary Osteoarthritis of Knee  Pain Management Contract Signed  Lumbar Facet Arthropathy    Plan of Care  Pharmacotherapy (Medications Ordered): Meds ordered this encounter  Medications  . buprenorphine (BUTRANS) 5 MCG/HR PTWK    Sig: Place 1 patch onto the skin every 7 (seven) days.    Dispense:  4 patch    Refill:  1    Do not place this medication, or any other prescription from our practice, on "Automatic Refill". Patient may have prescription filled one day early if pharmacy is closed on scheduled refill date.  [name] HYDROcodone-acetaminophen (NORCO/VICODIN) 5-325 MG tablet    Sig: Take 1 tablet by mouth daily as needed for severe pain. Must last 7 days.    Dispense:  30 tablet    Refill:  0    For acute post-operative pain. Not to be refilled. Must last 7 days.  [name] HYDROcodone-acetaminophen (NORCO/VICODIN) 5-325 MG tablet    Sig: Take 1 tablet by mouth daily as needed for severe pain. Must last 7 days.    Dispense:  30 tablet    Refill:  0    For acute post-operative pain. Not to be refilled. Must last 7 days.    Pharmacological management options:  Opioid Analgesics: We'll take over management today. See above orders Membrane stabilizer: Continue Cymbalta Muscle relaxant: Not indicated NSAID: Medically contraindicated CKD Other analgesic(s): To be determined at a later time    Interventional management options:  Considering:   Intra-articular knee Hyalgan injections can be done in wheelchair Patient is on Plavix and is morbidly obese and unable to get on fluoroscopy table for interventional therapy.   PRN Procedures:   None at this time    Provider-requested follow-up: Return in about 8 weeks (around 06/07/2020) for Medication Management, in person. Recent Visits Date Type Provider Dept  03/18/20 Office Visit Nichole Santa, MD [facility]   Showing recent visits within past 90 days and meeting all other requirements Today's Visits Date Type Provider Dept  04/12/20 Office Visit Nichole Santa, MD [facility]  Showing today's visits and meeting all other requirements Future Appointments Date Type Provider Dept  06/03/20 Appointment Nichole Santa, MD [facility]  Showing future appointments within next 90 days and meeting all other requirements  Primary Care Physician: Nichole Chandler, NP Note by: Nichole Santa, MD Date: 04/12/2020; Time: 12:53 PM

## 2020-04-12 NOTE — Telephone Encounter (Signed)
Hydrocodone scripts for Dec and Jan instruct to take as needed for post op acute pain. Must last 7 days, quantity 30. Is this correct?

## 2020-04-12 NOTE — Telephone Encounter (Signed)
It is not for post-op pain, it is for chronic pain syndrome. Still 30 tablets to last for 30 days.

## 2020-04-12 NOTE — Patient Instructions (Addendum)
Butrans to last until 06/07/20 and Hydrocodone/APAP to last until 06/11/20 have been escribed to your pharmacy.

## 2020-04-12 NOTE — Telephone Encounter (Signed)
Order placed and Pended for [name]'s approval.

## 2020-04-12 NOTE — Telephone Encounter (Signed)
Please call the pharmacy, they have questions re this patient's scripts.

## 2020-04-12 NOTE — Telephone Encounter (Signed)
Okay for PureWick Female External Catheter to use qhs, also would recommended a BSC if the PureWick Female External Catheter is not covered.

## 2020-04-13 ENCOUNTER — Telehealth: Payer: Self-pay

## 2020-04-13 NOTE — Telephone Encounter (Signed)
[name], daughter, Notified and will pick up. Left up front for pick up

## 2020-04-13 NOTE — Telephone Encounter (Signed)
Pharmacy notified.

## 2020-04-15 ENCOUNTER — Telehealth: Payer: Self-pay

## 2020-04-15 NOTE — Telephone Encounter (Signed)
Per her pharmacy, her insurance will not pay for the patches she just was prescribed. Please call daughter [phone].

## 2020-04-15 NOTE — Telephone Encounter (Signed)
Called daughter. Informed that Dr. Holley Raring will not be back until Monday 04/20/20. I will forward the message to him for advise.

## 2020-04-16 ENCOUNTER — Encounter: Payer: PPO | Admitting: Nurse Practitioner

## 2020-04-16 ENCOUNTER — Other Ambulatory Visit: Payer: Self-pay | Admitting: Nurse Practitioner

## 2020-04-16 DIAGNOSIS — R413 Other amnesia: Secondary | ICD-10-CM

## 2020-04-19 ENCOUNTER — Other Ambulatory Visit: Payer: Self-pay

## 2020-04-19 ENCOUNTER — Telehealth: Payer: Self-pay

## 2020-04-19 ENCOUNTER — Ambulatory Visit: Payer: PPO | Admitting: Nurse Practitioner

## 2020-04-19 ENCOUNTER — Encounter: Payer: Self-pay | Admitting: Nurse Practitioner

## 2020-04-19 VITALS — BP 110/60 | HR 83 | Ht 67.0 in

## 2020-04-19 DIAGNOSIS — I5032 Chronic diastolic (congestive) heart failure: Secondary | ICD-10-CM

## 2020-04-19 DIAGNOSIS — I214 Non-ST elevation (NSTEMI) myocardial infarction: Secondary | ICD-10-CM | POA: Diagnosis not present

## 2020-04-19 DIAGNOSIS — I10 Essential (primary) hypertension: Secondary | ICD-10-CM | POA: Diagnosis not present

## 2020-04-19 DIAGNOSIS — I2510 Atherosclerotic heart disease of native coronary artery without angina pectoris: Secondary | ICD-10-CM

## 2020-04-19 NOTE — Telephone Encounter (Signed)
Pharmacy notified that Butrans patches were denied.

## 2020-04-19 NOTE — Telephone Encounter (Signed)
The pharmacy wants to know if you have done the prior auth for her butrane patches

## 2020-04-19 NOTE — Patient Instructions (Addendum)
After Visit Summary:  We will be checking the following labs today - NONE   Medication Instructions:    Continue with your current medicines.    If you need a refill on your cardiac medications before your next appointment, please call your pharmacy.     Testing/Procedures To Be Arranged:  N/A  Follow-Up:   See Dr. Meda Coffee in 6 months -  You will receive a reminder letter in the mail two months in advance. If you don't receive a letter, please call our office to schedule the follow-up appointment.     At [facility], you and your health needs are our priority.  As part of our continuing mission to provide you with exceptional heart care, we have created designated Provider Care Teams.  These Care Teams include your primary Cardiologist (physician) and Advanced Practice Providers (APPs -  Physician Assistants and Nurse Practitioners) who all work together to provide you with the care you need, when you need it.  Special Instructions:  . Stay safe, wash your hands for at least 20 seconds and wear a mask when needed.  . It was good to talk with you today.  . Talk with [name] tomorrow about possible Palliative Care coming out to see you and/or Education officer, museum.    Call the [place] office at [phone] if you have any questions, problems or concerns.

## 2020-04-20 ENCOUNTER — Ambulatory Visit: Payer: PPO | Admitting: Nurse Practitioner

## 2020-04-21 ENCOUNTER — Other Ambulatory Visit: Payer: Self-pay | Admitting: Nurse Practitioner

## 2020-04-21 DIAGNOSIS — E875 Hyperkalemia: Secondary | ICD-10-CM

## 2020-04-22 ENCOUNTER — Other Ambulatory Visit: Payer: Self-pay

## 2020-04-22 ENCOUNTER — Ambulatory Visit (INDEPENDENT_AMBULATORY_CARE_PROVIDER_SITE_OTHER): Payer: PPO | Admitting: Nurse Practitioner

## 2020-04-22 ENCOUNTER — Encounter: Payer: Self-pay | Admitting: Nurse Practitioner

## 2020-04-22 DIAGNOSIS — Z0000 Encounter for general adult medical examination without abnormal findings: Secondary | ICD-10-CM | POA: Diagnosis not present

## 2020-04-22 NOTE — Progress Notes (Signed)
Subjective:   [name] is a 85 y.o. female who presents for Medicare Annual (Subsequent) preventive examination.  Review of Systems     Cardiac Risk Factors include: advanced age (>74men, >19 women);hypertension;dyslipidemia;diabetes mellitus;obesity (BMI >30kg/m2)     Objective:    There were no vitals filed for this visit. There is no height or weight on file to calculate BMI.  Advanced Directives 04/22/2020 04/12/2020 03/24/2020 03/18/2020 12/17/2019 09/12/2019 07/15/2019  Does Patient Have a Medical Advance Directive? Yes Yes Yes Yes No No Yes  Type of Advance Directive Out of facility DNR (pink MOST or yellow form) - Out of facility DNR (pink MOST or yellow form) - - - -  Does patient want to make changes to medical advance directive? No - Patient declined No - Patient declined No - Patient declined Yes (MAU/Ambulatory/Procedural Areas - Information given) - Yes (MAU/Ambulatory/Procedural Areas - Information given) No - Patient declined  Copy of [place] in Chart? - - - - - - -  Would patient like information on creating a medical advance directive? - - - - Yes (MAU/Ambulatory/Procedural Areas - Information given) - -  Pre-existing out of facility DNR order (yellow form or pink MOST form) Pink MOST form placed in chart (order not valid for inpatient use) - Pink MOST form placed in chart (order not valid for inpatient use) - - - -    Current Medications (verified) Outpatient Encounter Medications as of 04/22/2020  Medication Sig  . albuterol (PROAIR HFA) 108 (90 Base) MCG/ACT inhaler Inhale 1-2 puffs into the lungs every 6 (six) hours as needed for wheezing or shortness of breath.  . anastrozole (ARIMIDEX) 1 MG tablet TAKE 1 TABLET BY MOUTH EVERY DAY  . aspirin 81 MG EC tablet Take 1 tablet (81 mg total) by mouth daily. Swallow whole.  [name] atorvastatin (LIPITOR) 80 MG tablet TAKE 1 TABLET BY MOUTH EVERY DAY  . budesonide-formoterol (SYMBICORT) 160-4.5 MCG/ACT  inhaler Inhale 2 puffs into the lungs 2 (two) times daily. For bronchitis  . cetirizine (ZYRTEC) 10 MG tablet Take 1 tablet (10 mg total) by mouth daily.  . Continuous Blood Gluc Sensor (FREESTYLE LIBRE 14 DAY SENSOR) MISC Inject 1 Device into the skin as directed.  . Cyanocobalamin (VITAMIN B-12 PO) Take 1 tablet by mouth once a week.   . DULoxetine (CYMBALTA) 60 MG capsule TAKE 1 CAPSULE BY MOUTH EVERY DAY FOR ANXIETY  . ergocalciferol (VITAMIN D2) 1.25 MG (50000 UT) capsule Take 1 capsule (50,000 Units total) by mouth once a week.  . febuxostat (ULORIC) 40 MG tablet TAKE 1 TABLET BY MOUTH EVERY DAY  . fluticasone (FLONASE) 50 MCG/ACT nasal spray Place 1 spray into both nostrils in the morning and at bedtime.  . furosemide (LASIX) 20 MG tablet TAKE 1 TABLET BY MOUTH EVERY DAY AS NEEDED  . hydrALAZINE (APRESOLINE) 50 MG tablet Take 1 tablet (50 mg total) by mouth 2 (two) times daily.  Derrill Memo ON 05/12/2020] HYDROcodone-acetaminophen (NORCO/VICODIN) 5-325 MG tablet Take 1 tablet by mouth daily as needed for severe pain. Must last 7 days.  . hydrOXYzine (ATARAX/VISTARIL) 10 MG tablet Take 1 tablet by mouth three times daily as needed  . insulin aspart (NOVOLOG FLEXPEN) 100 UNIT/ML FlexPen Inject 16 Units into the skin 3 (three) times daily with meals. Max daily dose 70 units  . insulin glargine, 2 Unit Dial, (TOUJEO MAX SOLOSTAR) 300 UNIT/ML Solostar Pen Inject 38 Units into the skin daily. Patient assistance program  provides  . isosorbide mononitrate (IMDUR) 60 MG 24 hr tablet TAKE 1 TABLET BY MOUTH EVERY DAY  . meclizine (ANTIVERT) 25 MG tablet TAKE 1 TABLET BY MOUTH THREE TIMES DAILY AS NEEDED FOR DIZZINESS  . memantine (NAMENDA) 5 MG tablet TAKE 2 TABLETS BY MOUTH EVERY DAY and TAKE 1 TABLET BY MOUTH EVERY DAY  . metoprolol succinate (TOPROL-XL) 50 MG 24 hr tablet Take 50 mg by mouth daily.  . nitroGLYCERIN (NITROSTAT) 0.4 MG SL tablet Take one tablet under the tongue every 5 minutes as needed  for chest pain  . pantoprazole (PROTONIX) 40 MG tablet TAKE 1 TABLET BY MOUTH EVERY DAY  . pregabalin (LYRICA) 25 MG capsule Take 1 capsule (25 mg total) by mouth 2 (two) times daily.  . sodium polystyrene (SPS) 15 GM/60ML suspension TAKE 46mls BY MOUTH ONCE A WEEK TO keep potassium down  . topiramate (TOPAMAX) 25 MG tablet Take 1 tablet (25 mg total) by mouth daily.  . buprenorphine (BUTRANS) 5 MCG/HR PTWK Place 1 patch onto the skin every 7 (seven) days. (Patient not taking: Reported on 04/22/2020)  . [DISCONTINUED] acetaminophen (TYLENOL) 500 MG tablet Take 1,000 mg by mouth daily.  . [DISCONTINUED] clopidogrel (PLAVIX) 75 MG tablet TAKE 1 TABLET BY MOUTH EVERY DAY WITH BREAKFAST   No facility-administered encounter medications on file as of 04/22/2020.    Allergies (verified) Sulfonamide derivatives, Aricept [donepezil hcl], and Tramadol   History: Past Medical History:  Diagnosis Date  . Allergy    takes Mucinex daily as needed  . Anemia, unspecified   . Anxiety    takes Clonazepam daily as needed  . Arthritis   . Breast cancer ([place])   . Chronic diastolic CHF (congestive heart failure) (H[facility])    takes Furosemide daily  . CKD (chronic kidney disease), stage III ([place])   . Coronary artery disease    a. s/p IMI 2004 tx with BMS to RCA;  b. s/p Promus DES to RCA 2/12 c. abnormal nuc 2016 -> cath with med rx. d. NSTEMI 02/2020  mv CAD with severe ISR in pRCA and m/dRCA lesion both treated with DES.  . Dementia ([place])   . Depression    takes Cymbalta daily  . Diabetes mellitus    insulin daily  . GERD (gastroesophageal reflux disease)    takes Protonix daily  . Gout, unspecified   . Headache    occasionally  . History of blood clots   . Hyperlipidemia    takes Pravastatin daily  . Hypertension   . Insomnia   . Joint pain   . Joint swelling   . Morbid obesity ([place])   . Myocardial infarction ([place]) 2004  . Non-STEMI (non-ST elevated myocardial infarction) ([place]) 02/15/2020   . Numbness   . Obstructive sleep apnea    does not wear cpap  . Osteoarthritis   . Osteoarthritis   . Osteoarthrosis, unspecified whether generalized or localized, lower leg   . Pain, chronic   . Polymyalgia rheumatica ([place])   . Pulmonary emboli ([place]) 01/2012   felt to need lifelong anticoagulation but Xarelto dc in 02/2020 due to need for DAPT  . Urinary incontinence    takes Linzess daily  . Vertigo    hx of;was taking Meclizine if needed  . Wears dentures    Past Surgical History:  Procedure Laterality Date  . ABDOMINAL HYSTERECTOMY     partial  . APPENDECTOMY    . blood clots/legs and lungs  2013  . BREAST BIOPSY  Left 07/22/2014  . BREAST BIOPSY Left 02/10/2013  . BREAST LUMPECTOMY Left 11/05/2014  . BREAST LUMPECTOMY WITH RADIOACTIVE SEED LOCALIZATION Left 11/05/2014   Procedure: LEFT BREAST LUMPECTOMY WITH RADIOACTIVE SEED LOCALIZATION;  Surgeon: [name], MD;  Location: [facility];  Service: General;  Laterality: Left;  . CARDIAC CATHETERIZATION    . COLONOSCOPY    . CORONARY ANGIOPLASTY  2  . CORONARY STENT INTERVENTION N/A 02/17/2020   Procedure: CORONARY STENT INTERVENTION;  Surgeon: Leonie Man, MD;  Location: [facility];  Service: Cardiovascular;  Laterality: N/A;  . ESOPHAGOGASTRODUODENOSCOPY (EGD) WITH PROPOFOL N/A 11/07/2016   Procedure: ESOPHAGOGASTRODUODENOSCOPY (EGD) WITH PROPOFOL;  Surgeon: Gatha Mayer, MD;  Location: [facility];  Service: Endoscopy;  Laterality: N/A;  . EXCISION OF SKIN TAG Right 11/05/2014   Procedure: EXCISION OF RIGHT EYELID SKIN TAG;  Surgeon: [name], MD;  Location: [facility];  Service: General;  Laterality: Right;  . EYE SURGERY Bilateral    cataract   . GASTRIC BYPASS  1977    reversed in 1979, [place] CATH AND CORONARY ANGIOGRAPHY N/A 02/17/2020   Procedure: LEFT HEART CATH AND CORONARY ANGIOGRAPHY;  Surgeon: Leonie Man, MD;  Location: [facility];  Service: Cardiovascular;   Laterality: N/A;  . LEFT HEART CATHETERIZATION WITH CORONARY ANGIOGRAM N/A 06/29/2014   Procedure: LEFT HEART CATHETERIZATION WITH CORONARY ANGIOGRAM;  Surgeon: Troy Sine, MD;  Location: [facility] C[facility];  Service: Cardiovascular;  Laterality: N/A;  . MEMBRANE PEEL Right 10/23/2018   Procedure: MEMBRANE PEEL;  Surgeon: Hurman Horn, MD;  Location: [facility];  Service: Ophthalmology;  Laterality: Right;  . MI with stent placement  2004  . PARS PLANA VITRECTOMY Right 10/23/2018   Procedure: PARS PLANA VITRECTOMY WITH 25 GAUGE;  Surgeon: Hurman Horn, MD;  Location: [facility];  Service: Ophthalmology;  Laterality: Right;   Family History  Problem Relation Age of Onset  . Breast cancer Mother 53  . Heart disease Mother   . Throat cancer Father   . Hypertension Father   . Arthritis Father   . Diabetes Father   . Arthritis Sister   . Obesity Sister   . Diabetes Sister   . Heart disease Cousin   . Colon cancer Neg Hx   . Stomach cancer Neg Hx   . Esophageal cancer Neg Hx    Social History   Socioeconomic History  . Marital status: Widowed    Spouse name: Not on file  . Number of children: Not on file  . Years of education: Not on file  . Highest education level: Not on file  Occupational History  . Occupation: retired  Tobacco Use  . Smoking status: Never Smoker  . Smokeless tobacco: Never Used  Vaping Use  . Vaping Use: Never used  Substance and Sexual Activity  . Alcohol use: No    Alcohol/week: 0.0 standard drinks  . Drug use: No  . Sexual activity: Not Currently  Other Topics Concern  . Not on file  Social History Narrative  . Not on file   Social Determinants of Health   Financial Resource Strain: Not on file  Food Insecurity: Not on file  Transportation Needs: Not on file  Physical Activity: Not on file  Stress: Not on file  Social Connections: Not on file    Tobacco Counseling Counseling given: Not Answered   Clinical Intake:  Pre-visit preparation  completed: Yes  Pain : No/denies pain Pain  Type: Chronic pain     BMI - recorded: 52  How often do you need to have someone help you when you read instructions, pamphlets, or other written materials from your doctor or pharmacy?: 4 - Often  Diabetic?yes         Activities of Daily Living In your present state of health, do you have any difficulty performing the following activities: 04/22/2020 02/18/2020  Hearing? Y N  Vision? N N  Difficulty concentrating or making decisions? Y N  Walking or climbing stairs? Y Y  Dressing or bathing? Y Y  Doing errands, shopping? [name]  Preparing Food and eating ? Y -  Using the Toilet? Y -  In the past six months, have you accidently leaked urine? Y -  Managing your Medications? Y -  Managing your Finances? Y -  Housekeeping or managing your Housekeeping? Y -  Some recent data might be hidden    Patient Care Team: [name], NP as PCP - General (Geriatric Medicine) [name], MD as PCP - Cardiology (Cardiology) [name], [name], MD (Inactive) as Consulting Physician (Cardiology) [name], MD as Consulting Physician (Ophthalmology) [name], MD as Consulting Physician (General Surgery) [name], MD (Inactive) as Referring Physician (Obstetrics and Gynecology) [name] [name], MD as Consulting Physician (Ophthalmology) [facility], [name], MD as Consulting Physician (Endocrinology) [name], MD as Consulting Physician (Hematology) [name], DO as Consulting Physician (Neurology)  Indicate any recent Medical Services you may have received from other than [facility] providers in the past year (date may be approximate).     Assessment:   This is a routine wellness examination for Deneisha.  Hearing/Vision screen  Hearing Screening   125Hz  250Hz  500Hz  1000Hz  2000Hz  3000Hz  4000Hz  6000Hz  8000Hz   Right ear:           Left ear:           Comments: Patient has hearing problems, but does  not wear hearing aids.  Vision Screening Comments: Patient wears glasses. Patient has had an eye exam within past year. She sees Dr. [name]  Dietary issues and exercise activities discussed: Current Exercise Habits: The patient does not participate in regular exercise at present  Goals    . DIET - EAT MORE FRUITS AND VEGETABLES     Patient will increase vegetables.    . Increase physical activity     Starting 03/24/16,  I will attempt to get out of bed daily for at least 30 min at a time.     . Weight (lb) < 250 lb (113.4 kg)     Would like to lose weight to 250 lb by eating better       Depression Screen PHQ 2/9 Scores 04/14/2019 10/11/2018 04/12/2018 01/14/2018 10/25/2017 08/17/2017 06/25/2017  PHQ - 2 Score 3 6 6 3 3  - 0  PHQ- 9 Score 15 19 17 9 7  - -  Exception Documentation - - - - - Other- indicate reason in comment box -  Not completed - - - - - spoke with daughter, did not speak with patient -    Fall Risk Fall Risk  04/22/2020 04/12/2020 03/24/2020 03/18/2020 02/23/2020  Falls in the past year? 0 0 0 0 0  Number falls in past yr: 0 - 0 - 0  Comment - - - - -  Injury with Fall? 0 - 0 - 0  Comment - - - - -  Risk Factor Category  - - - - -  Risk for fall due to : - - - - -  Follow up - - - - -    [place]:  Any stairs in or around the home? No  If so, are there any without handrails? No  Home free of loose throw rugs in walkways, pet beds, electrical cords, etc? No  Adequate lighting in your home to reduce risk of falls? Yes   ASSISTIVE DEVICES UTILIZED TO PREVENT FALLS:  Life alert? No  Use of a cane, walker or w/c? Yes  Grab bars in the bathroom? Yes  Shower chair or bench in shower? Yes  Elevated toilet seat or a handicapped toilet? Yes   TIMED UP AND GO:  Was the test performed? No .    Cognitive Function: MMSE - Mini Mental State Exam 04/12/2018 08/02/2016 08/02/2016 07/27/2016 03/24/2016  Orientation to time 4 4 4 4 5    Orientation to Place 5 5 5 5 5   Registration 3 3 3 3 3   Attention/ Calculation 5 5 5 5  0  Recall 1 1 1 1 2   Language- name 2 objects 2 2 2 2 2   Language- repeat 1 1 1 1 1   Language- follow 3 step command 3 2 2 1 3   Language- read & follow direction 1 1 1 1 1   Write a sentence 1 1 1 1 1   Copy design 0 1 1 1 1   Total score 26 26 26 25 24      6CIT Screen 04/22/2020 04/14/2019  What Year? 0 points 0 points  What month? 0 points 0 points  What time? 0 points 0 points  Count back from 20 0 points 4 points  Months in reverse 4 points 4 points  Repeat phrase 6 points 8 points  Total Score 10 16    Immunizations Immunization History  Administered Date(s) Administered  . Fluad Quad(high Dose 65+) 02/12/2019, 02/18/2020  . Influenza Whole 03/14/2007, 02/13/2008, 01/18/2010  . Influenza, High Dose Seasonal PF 01/16/2017, 03/11/2018  . Influenza,inj,Quad PF,6+ Mos 01/08/2013, 02/18/2014, 01/18/2015, 01/24/2016  . Influenza-Unspecified 02/15/2012, 02/18/2014  . PFIZER SARS-COV-2 Vaccination 07/06/2019, 08/05/2019, 12/30/2019  . Pneumococcal Conjugate-13 02/27/2017  . Pneumococcal Polysaccharide-23 05/08/2002  . Td 05/08/2008    TDAP status: Due, Education has been provided regarding the importance of this vaccine. Advised may receive this vaccine at local pharmacy or Health Dept. Aware to provide a copy of the vaccination record if obtained from local pharmacy or Health Dept. Verbalized acceptance and understanding.  Flu Vaccine status: Up to date  Pneumococcal vaccine status: Up to date  Covid-19 vaccine status: Completed vaccines  Qualifies for Shingles Vaccine? Yes   Zostavax completed No   Shingrix Completed?: No.    Education has been provided regarding the importance of this vaccine. Patient has been advised to call insurance company to determine out of pocket expense if they have not yet received this vaccine. Advised may also receive vaccine at local pharmacy or Health Dept.  Verbalized acceptance and understanding.  Screening Tests Health Maintenance  Topic Date Due  . TETANUS/TDAP  05/08/2018  . HEMOGLOBIN A1C  06/24/2020  . COVID-19 Vaccine (4 - Booster for Pfizer series) 07/01/2020  . OPHTHALMOLOGY EXAM  08/26/2020  . MAMMOGRAM  02/19/2021  . FOOT EXAM  02/25/2021  . INFLUENZA VACCINE  Completed  . DEXA SCAN  Completed  . PNA vac Low Risk Adult  Completed    Health Maintenance  Health Maintenance Due  Topic Date Due  .  TETANUS/TDAP  05/08/2018    Colorectal cancer screening: No longer required.   Mammogram status: Completed 02/20/20. Repeat every year  Bone Density status: Completed 2018. Results reflect: Bone density results: NORMAL. Repeat every   years.  Lung Cancer Screening: (Low Dose CT Chest recommended if Age 84-80 years, 30 pack-year currently smoking OR have quit w/in 15years.) does not qualify.   Lung Cancer Screening Referral: na  Additional Screening:  Hepatitis C Screening: does not qualify  Vision Screening: Recommended annual ophthalmology exams for early detection of glaucoma and other disorders of the eye. Is the patient up to date with their annual eye exam?  Yes  Who is the provider or what is the name of the office in which the patient attends annual eye exams? Dr [name] If pt is not established with a provider, would they like to be referred to a provider to establish care? No .   Dental Screening: Recommended annual dental exams for proper oral hygiene  Community Resource Referral / Chronic Care Management: CRR required this visit?  No   CCM required this visit?  No      Plan:     I have personally reviewed and noted the following in the patient's chart:   . Medical and social history . Use of alcohol, tobacco or illicit drugs  . Current medications and supplements . Functional ability and status . Nutritional status . Physical activity . Advanced directives . List of other  physicians . Hospitalizations, surgeries, and ER visits in previous 12 months . Vitals . Screenings to include cognitive, depression, and falls . Referrals and appointments  In addition, I have reviewed and discussed with patient certain preventive protocols, quality metrics, and best practice recommendations. A written personalized care plan for preventive services as well as general preventive health recommendations were provided to patient.     [name], NP   04/22/2020    Virtual Visit via Telephone Note  I connected with@ on 04/22/20 at 11:00 AM EST by telephone and verified that I am speaking with the correct person using two identifiers.  Location: Patient: home Provider: [facility]   I discussed the limitations, risks, security and privacy concerns of performing an evaluation and management service by telephone and the availability of in person appointments. I also discussed with the patient that there may be a patient responsible charge related to this service. The patient expressed understanding and agreed to proceed.   I discussed the assessment and treatment plan with the patient. The patient was provided an opportunity to ask questions and all were answered. The patient agreed with the plan and demonstrated an understanding of the instructions.   The patient was advised to call back or seek an in-person evaluation if the symptoms worsen or if the condition fails to improve as anticipated.  I provided 18 minutes of non-face-to-face time during this encounter.  Carlos American. Harle Battiest Avs printed and mailed

## 2020-04-22 NOTE — Progress Notes (Signed)
This service is provided via telemedicine  No vital signs collected/recorded due to the encounter was a telemedicine visit.   Location of patient (ex: home, work):  Home  Patient consents to a telephone visit:  Yes, see encounter dated 01/29/2020  Location of the provider (ex: office, home):[employer]  Name of any referring provider:  N/A  Names of all persons participating in the telemedicine service and their role in the encounter: [name], Nurse Practitioner, Carroll Kinds, CMA, and patient.   Time spent on call: 11 minutes with medical assistant

## 2020-04-22 NOTE — Patient Instructions (Signed)
[name] , Thank you for taking time to come for your Medicare Wellness Visit. I appreciate your ongoing commitment to your health goals. Please review the following plan we discussed and let me know if I can assist you in the future.   Screening recommendations/referrals: Colonoscopy aged out  Mammogram up to date Bone Density -na Recommended yearly ophthalmology/optometry visit for glaucoma screening and checkup Recommended yearly dental visit for hygiene and checkup  Vaccinations: Influenza vaccine up tod ate Pneumococcal vaccine up to date Tdap vaccine RECOMMENDED_ to get at your local pharmacy  Shingles vaccine -RECOMMENDED_ to get shingrix at your local pharmacy  Advanced directives: on file.   Conditions/risks identified: morbid obesity, progressive memory loss, debility, Coronary artery disease  Next appointment: 1 year for AWV   Preventive Care 14 Years and Older, Female Preventive care refers to lifestyle choices and visits with your health care provider that can promote health and wellness. What does preventive care include?  A yearly physical exam. This is also called an annual well check.  Dental exams once or twice a year.  Routine eye exams. Ask your health care provider how often you should have your eyes checked.  Personal lifestyle choices, including:  Daily care of your teeth and gums.  Regular physical activity.  Eating a healthy diet.  Avoiding tobacco and drug use.  Limiting alcohol use.  Practicing safe sex.  Taking low-dose aspirin every day.  Taking vitamin and mineral supplements as recommended by your health care provider. What happens during an annual well check? The services and screenings done by your health care provider during your annual well check will depend on your age, overall health, lifestyle risk factors, and family history of disease. Counseling  Your health care provider may ask you questions about your:  Alcohol  use.  Tobacco use.  Drug use.  Emotional well-being.  Home and relationship well-being.  Sexual activity.  Eating habits.  History of falls.  Memory and ability to understand (cognition).  Work and work Statistician.  Reproductive health. Screening  You may have the following tests or measurements:  Height, weight, and BMI.  Blood pressure.  Lipid and cholesterol levels. These may be checked every 5 years, or more frequently if you are over 43 years old.  Skin check.  Lung cancer screening. You may have this screening every year starting at age 55 if you have a 30-pack-year history of smoking and currently smoke or have quit within the past 15 years.  Fecal occult blood test (FOBT) of the stool. You may have this test every year starting at age 30.  Flexible sigmoidoscopy or colonoscopy. You may have a sigmoidoscopy every 5 years or a colonoscopy every 10 years starting at age 64.  Hepatitis C blood test.  Hepatitis B blood test.  Sexually transmitted disease (STD) testing.  Diabetes screening. This is done by checking your blood sugar (glucose) after you have not eaten for a while (fasting). You may have this done every 1-3 years.  Bone density scan. This is done to screen for osteoporosis. You may have this done starting at age 55.  Mammogram. This may be done every 1-2 years. Talk to your health care provider about how often you should have regular mammograms. Talk with your health care provider about your test results, treatment options, and if necessary, the need for more tests. Vaccines  Your health care provider may recommend certain vaccines, such as:  Influenza vaccine. This is recommended every year.  Tetanus,  diphtheria, and acellular pertussis (Tdap, Td) vaccine. You may need a Td booster every 10 years.  Zoster vaccine. You may need this after age 52.  Pneumococcal 13-valent conjugate (PCV13) vaccine. One dose is recommended after age  13.  Pneumococcal polysaccharide (PPSV23) vaccine. One dose is recommended after age 4. Talk to your health care provider about which screenings and vaccines you need and how often you need them. This information is not intended to replace advice given to you by your health care provider. Make sure you discuss any questions you have with your health care provider. Document Released: 05/21/2015 Document Revised: 01/12/2016 Document Reviewed: 02/23/2015 Elsevier Interactive Patient Education  2017 [place] Prevention in the Home Falls can cause injuries. They can happen to people of all ages. There are many things you can do to make your home safe and to help prevent falls. What can I do on the outside of my home?  Regularly fix the edges of walkways and driveways and fix any cracks.  Remove anything that might make you trip as you walk through a door, such as a raised step or threshold.  Trim any bushes or trees on the path to your home.  Use bright outdoor lighting.  Clear any walking paths of anything that might make someone trip, such as rocks or tools.  Regularly check to see if handrails are loose or broken. Make sure that both sides of any steps have handrails.  Any raised decks and porches should have guardrails on the edges.  Have any leaves, snow, or ice cleared regularly.  Use sand or salt on walking paths during winter.  Clean up any spills in your garage right away. This includes oil or grease spills. What can I do in the bathroom?  Use night lights.  Install grab bars by the toilet and in the tub and shower. Do not use towel bars as grab bars.  Use non-skid mats or decals in the tub or shower.  If you need to sit down in the shower, use a plastic, non-slip stool.  Keep the floor dry. Clean up any water that spills on the floor as soon as it happens.  Remove soap buildup in the tub or shower regularly.  Attach bath mats securely with double-sided  non-slip rug tape.  Do not have throw rugs and other things on the floor that can make you trip. What can I do in the bedroom?  Use night lights.  Make sure that you have a light by your bed that is easy to reach.  Do not use any sheets or blankets that are too big for your bed. They should not hang down onto the floor.  Have a firm chair that has side arms. You can use this for support while you get dressed.  Do not have throw rugs and other things on the floor that can make you trip. What can I do in the kitchen?  Clean up any spills right away.  Avoid walking on wet floors.  Keep items that you use a lot in easy-to-reach places.  If you need to reach something above you, use a strong step stool that has a grab bar.  Keep electrical cords out of the way.  Do not use floor polish or wax that makes floors slippery. If you must use wax, use non-skid floor wax.  Do not have throw rugs and other things on the floor that can make you trip. What can I do with  my stairs?  Do not leave any items on the stairs.  Make sure that there are handrails on both sides of the stairs and use them. Fix handrails that are broken or loose. Make sure that handrails are as long as the stairways.  Check any carpeting to make sure that it is firmly attached to the stairs. Fix any carpet that is loose or worn.  Avoid having throw rugs at the top or bottom of the stairs. If you do have throw rugs, attach them to the floor with carpet tape.  Make sure that you have a light switch at the top of the stairs and the bottom of the stairs. If you do not have them, ask someone to add them for you. What else can I do to help prevent falls?  Wear shoes that:  Do not have high heels.  Have rubber bottoms.  Are comfortable and fit you well.  Are closed at the toe. Do not wear sandals.  If you use a stepladder:  Make sure that it is fully opened. Do not climb a closed stepladder.  Make sure that both  sides of the stepladder are locked into place.  Ask someone to hold it for you, if possible.  Clearly mark and make sure that you can see:  Any grab bars or handrails.  First and last steps.  Where the edge of each step is.  Use tools that help you move around (mobility aids) if they are needed. These include:  Canes.  Walkers.  Scooters.  Crutches.  Turn on the lights when you go into a dark area. Replace any light bulbs as soon as they burn out.  Set up your furniture so you have a clear path. Avoid moving your furniture around.  If any of your floors are uneven, fix them.  If there are any pets around you, be aware of where they are.  Review your medicines with your doctor. Some medicines can make you feel dizzy. This can increase your chance of falling. Ask your doctor what other things that you can do to help prevent falls. This information is not intended to replace advice given to you by your health care provider. Make sure you discuss any questions you have with your health care provider. Document Released: 02/18/2009 Document Revised: 09/30/2015 Document Reviewed: 05/29/2014 Elsevier Interactive Patient Education  2017 [employer].

## 2020-05-12 ENCOUNTER — Other Ambulatory Visit: Payer: Self-pay | Admitting: Hematology

## 2020-05-12 ENCOUNTER — Other Ambulatory Visit: Payer: Self-pay | Admitting: Nurse Practitioner

## 2020-05-12 DIAGNOSIS — G8929 Other chronic pain: Secondary | ICD-10-CM

## 2020-05-12 DIAGNOSIS — R519 Headache, unspecified: Secondary | ICD-10-CM

## 2020-05-14 DIAGNOSIS — C50912 Malignant neoplasm of unspecified site of left female breast: Secondary | ICD-10-CM | POA: Diagnosis not present

## 2020-05-14 DIAGNOSIS — F330 Major depressive disorder, recurrent, mild: Secondary | ICD-10-CM | POA: Diagnosis not present

## 2020-05-14 DIAGNOSIS — I25118 Atherosclerotic heart disease of native coronary artery with other forms of angina pectoris: Secondary | ICD-10-CM | POA: Diagnosis not present

## 2020-05-14 DIAGNOSIS — E1122 Type 2 diabetes mellitus with diabetic chronic kidney disease: Secondary | ICD-10-CM | POA: Diagnosis not present

## 2020-05-14 DIAGNOSIS — I2724 Chronic thromboembolic pulmonary hypertension: Secondary | ICD-10-CM | POA: Diagnosis not present

## 2020-05-14 DIAGNOSIS — E1159 Type 2 diabetes mellitus with other circulatory complications: Secondary | ICD-10-CM | POA: Diagnosis not present

## 2020-05-14 DIAGNOSIS — F0390 Unspecified dementia without behavioral disturbance: Secondary | ICD-10-CM | POA: Diagnosis not present

## 2020-05-14 DIAGNOSIS — D692 Other nonthrombocytopenic purpura: Secondary | ICD-10-CM | POA: Diagnosis not present

## 2020-05-14 DIAGNOSIS — D6869 Other thrombophilia: Secondary | ICD-10-CM | POA: Diagnosis not present

## 2020-05-14 DIAGNOSIS — E261 Secondary hyperaldosteronism: Secondary | ICD-10-CM | POA: Diagnosis not present

## 2020-05-14 DIAGNOSIS — I2782 Chronic pulmonary embolism: Secondary | ICD-10-CM | POA: Diagnosis not present

## 2020-05-31 ENCOUNTER — Other Ambulatory Visit: Payer: Self-pay | Admitting: Nurse Practitioner

## 2020-05-31 ENCOUNTER — Other Ambulatory Visit: Payer: Self-pay | Admitting: Hematology

## 2020-05-31 DIAGNOSIS — G629 Polyneuropathy, unspecified: Secondary | ICD-10-CM

## 2020-06-01 ENCOUNTER — Ambulatory Visit: Payer: PPO | Admitting: Podiatry

## 2020-06-03 ENCOUNTER — Ambulatory Visit (HOSPITAL_BASED_OUTPATIENT_CLINIC_OR_DEPARTMENT_OTHER): Payer: PPO | Admitting: Student in an Organized Health Care Education/Training Program

## 2020-06-03 ENCOUNTER — Ambulatory Visit
Admission: RE | Admit: 2020-06-03 | Discharge: 2020-06-03 | Disposition: A | Discharge status: Home / Self Care | Payer: PPO | Source: Ambulatory Visit | Attending: Student in an Organized Health Care Education/Training Program | Admitting: Student in an Organized Health Care Education/Training Program

## 2020-06-03 ENCOUNTER — Encounter: Payer: Self-pay | Admitting: Student in an Organized Health Care Education/Training Program

## 2020-06-03 ENCOUNTER — Other Ambulatory Visit: Payer: Self-pay

## 2020-06-03 VITALS — BP 151/49 | HR 81 | Temp 97.4°F | Resp 18 | Ht 67.0 in | Wt 338.0 lb

## 2020-06-03 DIAGNOSIS — G894 Chronic pain syndrome: Secondary | ICD-10-CM

## 2020-06-03 DIAGNOSIS — M48062 Spinal stenosis, lumbar region with neurogenic claudication: Secondary | ICD-10-CM

## 2020-06-03 DIAGNOSIS — M25521 Pain in right elbow: Secondary | ICD-10-CM | POA: Insufficient documentation

## 2020-06-03 DIAGNOSIS — G8929 Other chronic pain: Secondary | ICD-10-CM

## 2020-06-03 DIAGNOSIS — M170 Bilateral primary osteoarthritis of knee: Secondary | ICD-10-CM

## 2020-06-03 DIAGNOSIS — M7989 Other specified soft tissue disorders: Secondary | ICD-10-CM | POA: Diagnosis not present

## 2020-06-03 DIAGNOSIS — M47816 Spondylosis without myelopathy or radiculopathy, lumbar region: Secondary | ICD-10-CM | POA: Insufficient documentation

## 2020-06-03 DIAGNOSIS — M5416 Radiculopathy, lumbar region: Secondary | ICD-10-CM | POA: Insufficient documentation

## 2020-06-03 MED ORDER — HYDROCODONE-ACETAMINOPHEN 7.5-325 MG PO TABS: 1.0000 | ORAL_TABLET | Freq: Two times a day (BID) | ORAL | 0 refills | Status: DC | PRN

## 2020-06-03 NOTE — Progress Notes (Signed)
PROVIDER NOTE: Information contained herein reflects review and annotations entered in association with encounter. Interpretation of such information and data should be left to medically-trained personnel. Information provided to patient can be located elsewhere in the medical record under "Patient Instructions". Document created using STT-dictation technology, any transcriptional errors that may result from process are unintentional.    Patient: [name]  Service Category: E/M  Provider: Gillis Santa, MD  DOB: 1934/12/22  DOS: 06/03/2020  Specialty: Interventional Pain Management  MRN: [id number]  Setting: Ambulatory outpatient  PCP: Nichole Chandler, NP  Type: Established Patient    Referring Provider: [name], NP  Location: Office  Delivery: Face-to-face     HPI  [name], a 86 y.o. year old female, is here today because of her Chronic radicular lumbar pain [M54.16, G89.29]. [name] primary complain today is Pain (Generalized body aches and pains ), Shoulder Pain (Bilat, Left is worse and more frequent than right), and Elbow Pain (Right/) Last encounter: My last encounter with her was on 04/12/2020. Pertinent problems: [name] does not have any pertinent problems on file. Pain Assessment: Severity of Chronic pain is reported as a 8 /10. Location: Shoulder Right,Left (left is worse and more frequent; NEW right elbow)/denies. Onset: More than a month ago. Quality: Aching. Timing: Constant. Modifying factor(s): meds. Vitals:  height is '5\' 7"'  (1.702 m) and weight is 338 lb (153.3 kg) (abnormal). Her temporal temperature is 97.4 F (36.3 C) (abnormal). Her blood pressure is 151/49 (abnormal) and her pulse is 81. Her respiration is 18 and oxygen saturation is 100%.   Reason for encounter: medication management.    Patient presents today for her medication management visit.  At her previous visit, she was instructed to start buprenorphine transdermal pain patch she was  prescribed a short course of hydrocodone 5 mg to take daily as needed for breakthrough pain.  She states that she was unable to get a transdermal pain patch filled due to insurance issues.  She states that she has been taking hydrocodone 10 mg twice daily that she ran out over 2 weeks ago.  She was unaware that she had a prescription that she could pick up from her pharmacy.  She is requesting an increased dose that she is taking 10 mg twice a day.  We discussed increasing to 7.5 mg twice daily especially since she is not able to fill her buprenorphine.  She is also complaining of mild right elbow swelling.  I looked at this with ultrasound and did not see any evidence of abscess or fluid collection.  I will obtain an x-ray of her right elbow.  Pharmacotherapy Assessment   Analgesic: Hydrocodone 7.5 mg twice daily as needed, quantity 60/month  Monitoring: [name] PMP: PDMP reviewed during this encounter.       Pharmacotherapy: No side-effects or adverse reactions reported. Compliance: No problems identified. Effectiveness: Clinically acceptable.  Rise Patience, RN  06/03/2020 11:48 AM  Sign when Signing Visit Nursing Pain Medication Assessment:  Safety precautions to be maintained throughout the outpatient stay will include: orient to surroundings, keep bed in low position, maintain call bell within reach at all times, provide assistance with transfer out of bed and ambulation.  Medication Inspection Compliance: [name] did not comply with our request to bring her pills to be counted. She was reminded that bringing the medication bottles, even when empty, is a requirement.  Medication: None brought in. Pill/Patch Count: None available to be counted. Bottle Appearance:  No container available. Did not bring bottle(s) to appointment. Filled Date: N/A Last Medication intake:  Nichole Sleight, RN  06/03/2020 11:40 AM  Sign when Signing Visit Safety precautions to be maintained throughout the  outpatient stay will include: orient to surroundings, keep bed in low position, maintain call bell within reach at all times, provide assistance with transfer out of bed and ambulation.     UDS: No results found for: SUMMARY   ROS  Constitutional: Denies any fever or chills Gastrointestinal: No reported hemesis, hematochezia, vomiting, or acute GI distress Musculoskeletal: Denies any acute onset joint swelling, redness, loss of ROM, or weakness Neurological: No reported episodes of acute onset apraxia, aphasia, dysarthria, agnosia, amnesia, paralysis, loss of coordination, or loss of consciousness  Medication Review  Cyanocobalamin, DULoxetine, FreeStyle Libre 14 Day Sensor, HYDROcodone-acetaminophen, Vitamin D (Ergocalciferol), albuterol, anastrozole, aspirin, atorvastatin, budesonide-formoterol, buprenorphine, cetirizine, febuxostat, fluticasone, furosemide, hydrALAZINE, hydrOXYzine, insulin aspart, insulin glargine (2 Unit Dial), isosorbide mononitrate, meclizine, memantine, metoprolol succinate, nitroGLYCERIN, pantoprazole, pregabalin, sodium polystyrene, and topiramate  History Review  Allergy: [name] is allergic to sulfonamide derivatives, aricept [donepezil hcl], and tramadol. Drug: [name]  reports no history of drug use. Alcohol:  reports no history of alcohol use. Tobacco:  reports that she has never smoked. She has never used smokeless tobacco. Social: [name]  reports that she has never smoked. She has never used smokeless tobacco. She reports that she does not drink alcohol and does not use drugs. Medical:  has a past medical history of Allergy, Anemia, unspecified, Anxiety, Arthritis, Breast cancer ([place]), Chronic diastolic CHF (congestive heart failure) ([place]), CKD (chronic kidney disease), stage III ([name]), Coronary artery disease, Dementia ([name]), Depression, Diabetes mellitus, GERD (gastroesophageal reflux disease), Gout, unspecified, Headache, History of blood clots,  Hyperlipidemia, Hypertension, Insomnia, Joint pain, Joint swelling, Morbid obesity ([name]), Myocardial infarction ([name]) (2004), Non-STEMI (non-ST elevated myocardial infarction) ([place]) (02/15/2020), Numbness, Obstructive sleep apnea, Osteoarthritis, Osteoarthritis, Osteoarthrosis, unspecified whether generalized or localized, lower leg, Pain, chronic, Polymyalgia rheumatica ([place]), Pulmonary emboli ([place]) (01/2012), Urinary incontinence, Vertigo, and Wears dentures. Surgical: Ms. Faires  has a past surgical history that includes Appendectomy; Cardiac catheterization; Gastric bypass (1977 ); blood clots/legs and lungs (2013); MI with stent placement (2004); left heart catheterization with coronary angiogram (N/A, 06/29/2014); Coronary angioplasty (2); Eye surgery (Bilateral); Colonoscopy; Abdominal hysterectomy; Breast lumpectomy with radioactive seed localization (Left, 11/05/2014); Excision of skin tag (Right, 11/05/2014); Breast lumpectomy (Left, 11/05/2014); Breast biopsy (Left, 07/22/2014); Breast biopsy (Left, 02/10/2013); Esophagogastroduodenoscopy (egd) with propofol (N/A, 11/07/2016); Pars plana vitrectomy (Right, 10/23/2018); Membrane peel (Right, 10/23/2018); LEFT HEART CATH AND CORONARY ANGIOGRAPHY (N/A, 02/17/2020); and CORONARY STENT INTERVENTION (N/A, 02/17/2020). Family: family history includes Arthritis in her father and sister; Breast cancer (age of onset: 70) in her mother; Diabetes in her father and sister; Heart disease in her cousin and mother; Hypertension in her father; Obesity in her sister; Throat cancer in her father.  Laboratory Chemistry Profile   Renal Lab Results  Component Value Date   BUN 32 (H) 03/03/2020   CREATININE 1.98 (H) 03/03/2020   LABCREA 36 06/14/2017   BCR 20 03/02/2020   GFR 49.91 (L) 07/31/2012   GFRAA 38 (L) 03/02/2020   GFRNONAA 25 (L) 03/03/2020     Hepatic Lab Results  Component Value Date   AST 10 (L) 02/23/2020   ALT 6 02/23/2020   ALBUMIN 2.9 (L)  02/23/2020   ALKPHOS 84 02/23/2020   LIPASE 39 08/27/2018     Electrolytes Lab Results  Component Value Date   NA 138 03/03/2020   K 4.0 03/03/2020   CL 104 03/03/2020   CALCIUM 8.6 (L) 03/03/2020   MG 1.4 (L) 10/24/2017   PHOS 3.7 10/09/2017     Bone Lab Results  Component Value Date   VD25OH 21.76 (L) 07/24/2019     Inflammation (CRP: Acute Phase) (ESR: Chronic Phase) Lab Results  Component Value Date   CRP 1.0 (H) 01/30/2019   ESRSEDRATE 110 (H) 09/25/2014   LATICACIDVEN 1.5 01/25/2019       Note: Above Lab results reviewed.  Recent Imaging Review  VAS [place] LOWER EXTREMITY VENOUS (DVT) (ONLY [facility] & [facility] 7a-7p)  Lower Venous DVT Study  Indications: Elevated Ddimer.   Risk Factors: None identified. Limitations: Body habitus, poor ultrasound/tissue interface and patient pain tolerance, patient positioning. Comparison Study: No prior studies.  Performing Technologist: Oliver Hum RVT    Examination Guidelines: A complete evaluation includes B-mode imaging, spectral Doppler, color Doppler, and power Doppler as needed of all accessible portions of each vessel. Bilateral testing is considered an integral part of a complete examination. Limited examinations for reoccurring indications may be performed as noted. The reflux portion of the exam is performed with the patient in reverse Trendelenburg.    +---------+---------------+---------+-----------+----------+--------------+ RIGHT    CompressibilityPhasicitySpontaneityPropertiesThrombus Aging +---------+---------------+---------+-----------+----------+--------------+ CFV      Full           Yes      Yes                                 +---------+---------------+---------+-----------+----------+--------------+ SFJ      Full                                                        +---------+---------------+---------+-----------+----------+--------------+ FV Prox  Full                                                         +---------+---------------+---------+-----------+----------+--------------+ FV Mid                  Yes      Yes                                 +---------+---------------+---------+-----------+----------+--------------+ FV Distal               Yes      Yes                                 +---------+---------------+---------+-----------+----------+--------------+ PFV                                                   Not visualized +---------+---------------+---------+-----------+----------+--------------+ POP      Full           Yes      Yes                                 +---------+---------------+---------+-----------+----------+--------------+  PTV      Full                                                        +---------+---------------+---------+-----------+----------+--------------+ PERO                                                  Not visualized +---------+---------------+---------+-----------+----------+--------------+        +---------+---------------+---------+-----------+----------+--------------+ LEFT     CompressibilityPhasicitySpontaneityPropertiesThrombus Aging +---------+---------------+---------+-----------+----------+--------------+ CFV      Full           Yes      Yes                                 +---------+---------------+---------+-----------+----------+--------------+ SFJ      Full                                                        +---------+---------------+---------+-----------+----------+--------------+ FV Prox  Full                                                        +---------+---------------+---------+-----------+----------+--------------+ FV Mid   Full                                                        +---------+---------------+---------+-----------+----------+--------------+ FV DistalFull                                                         +---------+---------------+---------+-----------+----------+--------------+ PFV                                                   Not visualized +---------+---------------+---------+-----------+----------+--------------+ POP      Full           Yes      Yes                                 +---------+---------------+---------+-----------+----------+--------------+ PTV      Full                                                        +---------+---------------+---------+-----------+----------+--------------+  PERO                                                  Not visualized +---------+---------------+---------+-----------+----------+--------------+             Summary: RIGHT: - There is no evidence of deep vein thrombosis in the lower extremity. However, portions of this examination were limited- see technologist comments above.   - No cystic structure found in the popliteal fossa.   LEFT: - There is no evidence of deep vein thrombosis in the lower extremity. However, portions of this examination were limited- see technologist comments above.   - No cystic structure found in the popliteal fossa.   *See table(s) above for measurements and observations.  Electronically signed by Harold Barban MD on 03/03/2020 at 8:55:46 PM.      Final   NM Pulmonary Perfusion CLINICAL DATA:  Positive D-dimer, tachycardia  EXAM: NUCLEAR MEDICINE PERFUSION LUNG SCAN  TECHNIQUE: Perfusion images were obtained in multiple projections after intravenous injection of radiopharmaceutical.  Ventilation scans intentionally deferred if perfusion scan and chest x-ray adequate for interpretation during COVID 19 epidemic.  RADIOPHARMACEUTICALS:  4.1 mCi Tc-74mMAA IV  COMPARISON:  01/26/2012  FINDINGS: Improved perfusion to the left lung since prior study. No new discrete segmental or subsegmental perfusion defects to suggest pulmonary  emboli.  IMPRESSION: No scintigraphic evidence of interval pulmonary emboli.  Electronically Signed   By: DLucrezia EuropeM.D.   On: 03/03/2020 16:47 DG Chest 2 View CLINICAL DATA:  Shortness of breath.  EXAM: CHEST - 2 VIEW  COMPARISON:  02/17/2020  FINDINGS: Mild enlargement the cardiac silhouette. Aortic atherosclerosis. No consolidation. No visible pleural effusions or pneumothorax. No acute osseous abnormality. Similar mild elevation the right hemidiaphragm.  IMPRESSION: 1. No acute cardiopulmonary disease. 2. Mild cardiomegaly.  Electronically Signed   By: FMargaretha SheffieldMD   On: 03/03/2020 13:15 Note: Reviewed        Physical Exam  General appearance: Well nourished, well developed, and well hydrated. In no apparent acute distress Mental status: Alert, oriented x 3 (person, place, & time)       Respiratory: No evidence of acute respiratory distress Eyes: PERLA Vitals: BP (!) 151/49   Pulse 81   Temp (!) 97.4 F (36.3 C) (Temporal)   Resp 18   Ht '5\' 7"'  (1.702 m)   Wt (!) 338 lb (153.3 kg)   SpO2 100%   BMI 52.94 kg/m  BMI: Estimated body mass index is 52.94 kg/m as calculated from the following:   Height as of this encounter: '5\' 7"'  (1.702 m).   Weight as of this encounter: 338 lb (153.3 kg). Ideal: Ideal body weight: 61.6 kg (135 lb 12.9 oz) Adjusted ideal body weight: 98.3 kg (216 lb 10.9 oz)   Cervical Spine Exam  Skin & Axial Inspection:No masses, redness, edema, swelling, or associated skin lesions Alignment:Symmetrical Functional RPJP:[id number] Stability:No instability detected Muscle Tone/Strength:Functionally intact. No obvious neuro-muscular anomalies detected. Sensory (Neurological):Musculoskeletal pain pattern Palpation:No palpable anomalies        Upper Extremity (UE) Exam    Side:Right upper extremity  Side:Left upper extremity   Skin & Extremity Inspection:Skin color, temperature, and hair growth  are WNL. No peripheral edema or cyanosis. No masses, redness, swelling, asymmetry, or associated skin lesions. No contractures.  Skin & Extremity Inspection:Skin color, temperature, and  hair growth are WNL. No peripheral edema or cyanosis. No masses, redness, swelling, asymmetry, or associated skin lesions. No contractures.   Functional QMV:HQIO restricted ROMfor all joints of upper extremity  Functional NGE:XBMW restricted ROMfor all joints of upper extremity   Muscle Tone/Strength:Functionally intact. No obvious neuro-muscular anomalies detected.   Muscle Tone/Strength:Functionally intact. No obvious neuro-muscular anomalies detected.   Sensory (Neurological):Musculoskeletal pain pattern  Sensory (Neurological):Musculoskeletal pain pattern   Palpation:No palpable anomalies  Palpation:No palpable anomalies   Provocative Test(s): Phalen's test:deferred Tinel's test:deferred Apley's scratch test (touch opposite shoulder): Action 1 (Across chest):deferred Action 2 (Overhead):deferred Action 3 (LB reach):deferred   Provocative Test(s): Phalen's test:deferred Tinel's test:deferred Apley's scratch test (touch opposite shoulder): Action 1 (Across chest):deferred Action 2 (Overhead):deferred Action 3 (LB reach):deferred     Thoracic Spine Area Exam  Skin & Axial Inspection:No masses, redness, or swelling Alignment:Symmetrical Functional UXL:KGMW restricted ROM Stability:No instability detected Muscle Tone/Strength:Functionally intact. No obvious neuro-muscular anomalies detected. Sensory (Neurological):Unimpaired Muscle strength & Tone:No palpable anomalies  Lumbar Exam  Skin & Axial Inspection:No masses, redness, or swelling Alignment:Symmetrical Functional NUU:VOZD restricted ROM Stability:No instability detected Muscle Tone/Strength:Functionally intact. No obvious neuro-muscular  anomalies detected. Sensory (Neurological):Musculoskeletal pain pattern Palpation:No palpable anomalies Provocative Tests: Hyperextension/rotation test:Unable to perform Lumbar quadrant test (Kemp's test):Unable to perform Lateral bending test:Unable to perform Patrick's Maneuver:Unable to perform  Ambulation:Patient came in today in a wheel chair Gait:Modified gait pattern (slower gait speed, wider stride width, and longer stance duration) associated with morbid obesity Posture:Difficulty standing up straight, due to pain  Lower Extremity Exam    Side:Right lower extremity  Side:Left lower extremity  Stability:No instability observed  Stability:No instability observed  Skin & Extremity Inspection:Skin color, temperature, and hair growth are WNL. No peripheral edema or cyanosis. No masses, redness, swelling, asymmetry, or associated skin lesions. No contractures.  Skin & Extremity Inspection:Skin color, temperature, and hair growth are WNL. No peripheral edema or cyanosis. No masses, redness, swelling, asymmetry, or associated skin lesions. No contractures.  Functional GUY:QIHK restricted ROMfor all joints of the lower extremity   Functional VQQ:VZDG restricted ROMfor all joints of the lower extremity   Muscle Tone/Strength:Functionally intact. No obvious neuro-muscular anomalies detected.  Muscle Tone/Strength:Functionally intact. No obvious neuro-muscular anomalies detected.  Sensory (Neurological):Neurogenic pain pattern  Sensory (Neurological):Neurogenic pain pattern  DTR: Patellar:deferred today Achilles:deferred today Plantar:deferred today  DTR: Patellar:deferred today Achilles:deferred today Plantar:deferred today  Palpation:No palpable anomalies  Palpation:No palpable anomalies    Assessment   Status Diagnosis  Controlled Having a  Flare-up Controlled 1. Chronic radicular lumbar pain   2. Right elbow pain   3. Lumbar facet arthropathy   4. Lumbar spondylosis   5. Spinal stenosis, lumbar region, with neurogenic claudication   6. Bilateral primary osteoarthritis of [name]   7. Chronic pain syndrome      Plan of Care   [name] has a current medication list which includes the following long-term medication(s): albuterol, atorvastatin, budesonide-formoterol, cetirizine, duloxetine, febuxostat, fluticasone, furosemide, hydralazine, novolog flexpen, toujeo max solostar, isosorbide mononitrate, memantine, pantoprazole, pregabalin, and topiramate.  Pharmacotherapy (Medications Ordered): Meds ordered this encounter  Medications  . HYDROcodone-acetaminophen (NORCO) 7.5-325 MG tablet    Sig: Take 1 tablet by mouth 2 (two) times daily as needed for severe pain. Must last 30 days.    Dispense:  60 tablet    Refill:  0    Chronic Pain. (STOP Act - Not applicable). Fill one day early if closed on scheduled refill date.   Orders:  Orders Placed This Encounter  Procedures  . DG ELBOW COMPLETE RIGHT (3+VIEW)    Standing Status:   Future    Number of Occurrences:   1    Standing Expiration Date:   12/01/2020    Order Specific Question:   Reason for Exam (SYMPTOM  OR DIAGNOSIS REQUIRED)    Answer:   elbow swelling    Order Specific Question:   Preferred imaging location?    Answer:   [facility]    Order Specific Question:   Radiology Contrast Protocol - do NOT remove file path    Answer:   [email or web address]   Follow-up plan:   Return in about 5 weeks (around 07/08/2020) for Medication Management, in person.      Interventional management options:  Considering:   Intra-articular [name] Hyalgan injections can be done in wheelchair Patient is on Plavix and is morbidly obese and unable to get on fluoroscopy table for interventional therapy.   PRN Procedures:   None at  this time     Recent Visits Date Type Provider Dept  04/12/20 Office Visit Nichole Santa, MD [facility]  03/18/20 Office Visit Nichole Santa, MD [facility]  Showing recent visits within past 90 days and meeting all other requirements Today's Visits Date Type Provider Dept  06/03/20 Office Visit Nichole Santa, MD [facility]  Showing today's visits and meeting all other requirements Future Appointments Date Type Provider Dept  07/08/20 Appointment Nichole Santa, MD [facility]  Showing future appointments within next 90 days and meeting all other requirements  I discussed the assessment and treatment plan with the patient. The patient was provided an opportunity to ask questions and all were answered. The patient agreed with the plan and demonstrated an understanding of the instructions.  Patient advised to call back or seek an in-person evaluation if the symptoms or condition worsens.  Duration of encounter: 39mnutes.  Note by: BGillis Santa MD Date: 06/03/2020; Time: 1:19 PM

## 2020-06-03 NOTE — Progress Notes (Signed)
Safety precautions to be maintained throughout the outpatient stay will include: orient to surroundings, keep bed in low position, maintain call bell within reach at all times, provide assistance with transfer out of bed and ambulation.  

## 2020-06-03 NOTE — Progress Notes (Signed)
Nursing Pain Medication Assessment:  Safety precautions to be maintained throughout the outpatient stay will include: orient to surroundings, keep bed in low position, maintain call bell within reach at all times, provide assistance with transfer out of bed and ambulation.  Medication Inspection Compliance: [name] did not comply with our request to bring her pills to be counted. She was reminded that bringing the medication bottles, even when empty, is a requirement.  Medication: None brought in. Pill/Patch Count: None available to be counted. Bottle Appearance: No container available. Did not bring bottle(s) to appointment. Filled Date: N/A Last Medication intake:  Yesterday

## 2020-06-04 ENCOUNTER — Telehealth: Payer: Self-pay | Admitting: Student in an Organized Health Care Education/Training Program

## 2020-06-04 ENCOUNTER — Telehealth: Payer: Self-pay

## 2020-06-04 MED ORDER — HYDROCODONE-ACETAMINOPHEN 7.5-325 MG PO TABS: 1.0000 | ORAL_TABLET | Freq: Two times a day (BID) | ORAL | 0 refills | Status: DC | PRN

## 2020-06-04 MED ORDER — HYDROCODONE-ACETAMINOPHEN 7.5-325 MG PO TABS: 1.0000 | ORAL_TABLET | Freq: Two times a day (BID) | ORAL | 0 refills | Status: AC | PRN

## 2020-06-04 NOTE — Telephone Encounter (Signed)
Spoke with pharmacy.  They did not have a prescription for January for Hydrocodone because someone from another clinic cancelled the script.  He sent in 2 new prescription for Hydrocodone 7.5 mg to her pharmacy and they were filling the one for today.  Spoke with her family and notified them of above.  Informed them that we would be putting her appointment out a couple of weeks and she would be receiving a call.

## 2020-06-04 NOTE — Addendum Note (Signed)
Addended by: Gillis Santa on: 06/04/2020 11:58 AM   Modules accepted: Orders

## 2020-06-04 NOTE — Telephone Encounter (Signed)
Dr Holley Raring wrote her 2 new prescriptions lasting until march 29th.  Can you move her appointment that is March 3rd out a couple of weeks since she does not need to come so early?  Please call and notify her of appointment change if you change it.  Thanks.

## 2020-06-10 ENCOUNTER — Other Ambulatory Visit: Payer: Self-pay | Admitting: Nurse Practitioner

## 2020-06-10 DIAGNOSIS — G629 Polyneuropathy, unspecified: Secondary | ICD-10-CM

## 2020-06-14 ENCOUNTER — Other Ambulatory Visit: Payer: Self-pay | Admitting: Nurse Practitioner

## 2020-06-14 DIAGNOSIS — G629 Polyneuropathy, unspecified: Secondary | ICD-10-CM

## 2020-06-14 MED ORDER — PREGABALIN 25 MG PO CAPS: 25.0000 mg | ORAL_CAPSULE | Freq: Two times a day (BID) | ORAL | 3 refills | Status: DC

## 2020-06-14 NOTE — Addendum Note (Signed)
Addended by: [name] on: 06/14/2020 12:45 PM   Modules accepted: Orders

## 2020-06-14 NOTE — Telephone Encounter (Signed)
rx sent to pharmacy by e-script  

## 2020-06-14 NOTE — Addendum Note (Signed)
Addended by: Tanna Savoy on: 06/14/2020 11:49 AM   Modules accepted: Orders

## 2020-06-29 ENCOUNTER — Ambulatory Visit: Payer: PPO | Admitting: Internal Medicine

## 2020-06-29 ENCOUNTER — Encounter: Payer: Self-pay | Admitting: Internal Medicine

## 2020-06-29 ENCOUNTER — Ambulatory Visit (INDEPENDENT_AMBULATORY_CARE_PROVIDER_SITE_OTHER): Payer: PPO | Admitting: Internal Medicine

## 2020-06-29 ENCOUNTER — Other Ambulatory Visit: Payer: Self-pay

## 2020-06-29 VITALS — BP 130/58 | HR 80

## 2020-06-29 DIAGNOSIS — Z794 Long term (current) use of insulin: Secondary | ICD-10-CM | POA: Diagnosis not present

## 2020-06-29 DIAGNOSIS — N1832 Chronic kidney disease, stage 3b: Secondary | ICD-10-CM

## 2020-06-29 DIAGNOSIS — E1165 Type 2 diabetes mellitus with hyperglycemia: Secondary | ICD-10-CM | POA: Diagnosis not present

## 2020-06-29 DIAGNOSIS — E1065 Type 1 diabetes mellitus with hyperglycemia: Secondary | ICD-10-CM | POA: Diagnosis not present

## 2020-06-29 DIAGNOSIS — E1140 Type 2 diabetes mellitus with diabetic neuropathy, unspecified: Secondary | ICD-10-CM | POA: Diagnosis not present

## 2020-06-29 DIAGNOSIS — E1122 Type 2 diabetes mellitus with diabetic chronic kidney disease: Secondary | ICD-10-CM

## 2020-06-29 LAB — POCT GLYCOSYLATED HEMOGLOBIN (HGB A1C): Hemoglobin A1C: 9.4 % — AB (ref 4.0–5.6)

## 2020-06-29 MED ORDER — TRULICITY 0.75 MG/0.5ML ~~LOC~~ SOAJ
0.7500 mg | SUBCUTANEOUS | 6 refills | Status: DC
Start: 2020-06-29 — End: 2020-09-29

## 2020-06-29 NOTE — Progress Notes (Signed)
Name: [name]  Age/ Sex: 86 y.o., female   MRN/ DOB: [id number], 1934/08/03     PCP: Nichole Chandler, NP   Reason for Endocrinology Evaluation: Type 2 Diabetes Mellitus     Initial Endocrinology Clinic Visit: 08/19/2018    PATIENT IDENTIFIER: [name] is a 86 y.o. female with a past medical history of T2DM, CKD III, and chronic pain syndrome . The patient has followed with Endocrinology clinic since 08/19/2018 for consultative assistance with management of her diabetes.  DIABETIC HISTORY:  [name] was diagnosed with T2DM > 40 yrs ago. Pt is a poor historian and unable to recall oral glycemic agents. She has been on insulin therapy for years. Her hemoglobin A1c has ranged from 6.7%  in 07/2017, peaking at 12.1% in 2018.  SUBJECTIVE:   During the last visit (12/23/2019): A1c 8.9 %  . We adjusted  MDI regimen.     Today (06/29/2020): [name] is here for a follow up visit on diabetes management. She is accompanied by her sister. They use the CGM at home . She does forget to take    Had an NSTEMI 02/2020 . S/P DES PCI      HOME DIABETES REGIMEN:   Toujeo 38 units daily - taking 44  Units   Novolog 16 units TID   CF: BG -150/30 with meals     CONTINUOUS GLUCOSE MONITORING RECORD INTERPRETATION    Dates of Recording: 2/9-2/22/2022  Sensor description:freestyle libre  Results statistics:   CGM use % of time 59  Average and SD 228/38.7  Time in range      34  %  % Time Above 180 28  % Time above 250 38  % Time Below target 0     Glycemic patterns summary: hyperglycemia during the day , lasts all day and trends down overnight   Hyperglycemic episodes  Postprandial   Hypoglycemic episodes occurred n/a  Overnight periods: trends down to goal    HISTORY:  Past Medical History:  Past Medical History:  Diagnosis Date  . Allergy    takes Mucinex daily as needed  . Anemia, unspecified   . Anxiety    takes Clonazepam daily as  needed  . Arthritis   . Breast cancer ([place])   . Chronic diastolic CHF (congestive heart failure) (H[facility])    takes Furosemide daily  . CKD (chronic kidney disease), stage III ([place])   . Coronary artery disease    a. s/p IMI 2004 tx with BMS to RCA;  b. s/p Promus DES to RCA 2/12 c. abnormal nuc 2016 -> cath with med rx. d. NSTEMI 02/2020  mv CAD with severe ISR in pRCA and m/dRCA lesion both treated with DES.  . Dementia ([place])   . Depression    takes Cymbalta daily  . Diabetes mellitus    insulin daily  . GERD (gastroesophageal reflux disease)    takes Protonix daily  . Gout, unspecified   . Headache    occasionally  . History of blood clots   . Hyperlipidemia    takes Pravastatin daily  . Hypertension   . Insomnia   . Joint pain   . Joint swelling   . Morbid obesity ([place])   . Myocardial infarction ([place]) 2004  . Non-STEMI (non-ST elevated myocardial infarction) ([place]) 02/15/2020  . Numbness   . Obstructive sleep apnea    does not wear cpap  . Osteoarthritis   .  Osteoarthritis   . Osteoarthrosis, unspecified whether generalized or localized, lower leg   . Pain, chronic   . Polymyalgia rheumatica ([place])   . Pulmonary emboli ([place]) 01/2012   felt to need lifelong anticoagulation but Xarelto dc in 02/2020 due to need for DAPT  . Urinary incontinence    takes Linzess daily  . Vertigo    hx of;was taking Meclizine if needed  . Wears dentures    Past Surgical History:  Past Surgical History:  Procedure Laterality Date  . ABDOMINAL HYSTERECTOMY     partial  . APPENDECTOMY    . blood clots/legs and lungs  2013  . BREAST BIOPSY Left 07/22/2014  . BREAST BIOPSY Left 02/10/2013  . BREAST LUMPECTOMY Left 11/05/2014  . BREAST LUMPECTOMY WITH RADIOACTIVE SEED LOCALIZATION Left 11/05/2014   Procedure: LEFT BREAST LUMPECTOMY WITH RADIOACTIVE SEED LOCALIZATION;  Surgeon: [name], MD;  Location: [facility];  Service: General;  Laterality: Left;  . CARDIAC CATHETERIZATION    .  COLONOSCOPY    . CORONARY ANGIOPLASTY  2  . CORONARY STENT INTERVENTION N/A 02/17/2020   Procedure: CORONARY STENT INTERVENTION;  Surgeon: Leonie Man, MD;  Location: [facility];  Service: Cardiovascular;  Laterality: N/A;  . ESOPHAGOGASTRODUODENOSCOPY (EGD) WITH PROPOFOL N/A 11/07/2016   Procedure: ESOPHAGOGASTRODUODENOSCOPY (EGD) WITH PROPOFOL;  Surgeon: Gatha Mayer, MD;  Location: [facility];  Service: Endoscopy;  Laterality: N/A;  . EXCISION OF SKIN TAG Right 11/05/2014   Procedure: EXCISION OF RIGHT EYELID SKIN TAG;  Surgeon: [name], MD;  Location: [facility];  Service: General;  Laterality: Right;  . EYE SURGERY Bilateral    cataract   . GASTRIC BYPASS  1977    reversed in 1979, [place] CATH AND CORONARY ANGIOGRAPHY N/A 02/17/2020   Procedure: LEFT HEART CATH AND CORONARY ANGIOGRAPHY;  Surgeon: Leonie Man, MD;  Location: [facility];  Service: Cardiovascular;  Laterality: N/A;  . LEFT HEART CATHETERIZATION WITH CORONARY ANGIOGRAM N/A 06/29/2014   Procedure: LEFT HEART CATHETERIZATION WITH CORONARY ANGIOGRAM;  Surgeon: Troy Sine, MD;  Location: [facility];  Service: Cardiovascular;  Laterality: N/A;  . MEMBRANE PEEL Right 10/23/2018   Procedure: MEMBRANE PEEL;  Surgeon: Hurman Horn, MD;  Location: [facility];  Service: Ophthalmology;  Laterality: Right;  . MI with stent placement  2004  . PARS PLANA VITRECTOMY Right 10/23/2018   Procedure: PARS PLANA VITRECTOMY WITH 25 GAUGE;  Surgeon: Hurman Horn, MD;  Location: [facility];  Service: Ophthalmology;  Laterality: Right;    Social History:  reports that she has never smoked. She has never used smokeless tobacco. She reports that she does not drink alcohol and does not use drugs. Family History:  Family History  Problem Relation Age of Onset  . Breast cancer Mother 21  . Heart disease Mother   . Throat cancer Father   . Hypertension Father   . Arthritis Father   . Diabetes Father    . Arthritis Sister   . Obesity Sister   . Diabetes Sister   . Heart disease Cousin   . Colon cancer Neg Hx   . Stomach cancer Neg Hx   . Esophageal cancer Neg Hx      HOME MEDICATIONS: Allergies as of 06/29/2020      Reactions   Sulfonamide Derivatives Swelling   Mouth swelling   Aricept [donepezil Hcl] Diarrhea, Nausea And Vomiting   GI upset/loose stools   Tramadol Nausea And  Vomiting      Medication List       Accurate as of June 29, 2020  2:43 PM. If you have any questions, ask your nurse or doctor.        albuterol 108 (90 Base) MCG/ACT inhaler Commonly known as: ProAir HFA Inhale 1-2 puffs into the lungs every 6 (six) hours as needed for wheezing or shortness of breath.   anastrozole 1 MG tablet Commonly known as: ARIMIDEX TAKE 1 TABLET BY MOUTH EVERY DAY   atorvastatin 80 MG tablet Commonly known as: LIPITOR TAKE 1 TABLET BY MOUTH EVERY DAY   budesonide-formoterol 160-4.5 MCG/ACT inhaler Commonly known as: SYMBICORT Inhale 2 puffs into the lungs 2 (two) times daily. For bronchitis   cetirizine 10 MG tablet Commonly known as: ZYRTEC Take 1 tablet (10 mg total) by mouth daily.   DULoxetine 60 MG capsule Commonly known as: CYMBALTA TAKE 1 CAPSULE BY MOUTH EVERY DAY FOR ANXIETY   febuxostat 40 MG tablet Commonly known as: ULORIC TAKE 1 TABLET BY MOUTH EVERY DAY   fluticasone 50 MCG/ACT nasal spray Commonly known as: FLONASE Place 1 spray into both nostrils in the morning and at bedtime.   FreeStyle Libre 14 Day Sensor Misc Inject 1 Device into the skin as directed.   furosemide 20 MG tablet Commonly known as: LASIX TAKE 1 TABLET BY MOUTH EVERY DAY AS NEEDED   GoodSense Aspirin Low Dose 81 MG EC tablet Generic drug: aspirin TAKE 1 TABLET BY MOUTH EVERY DAY SWALLOW WHOLE   hydrALAZINE 50 MG tablet Commonly known as: APRESOLINE TAKE 1 TABLET BY MOUTH 2 TIMES DAILY   HYDROcodone-acetaminophen 7.5-325 MG tablet Commonly known as:  Norco Take 1 tablet by mouth 2 (two) times daily as needed for severe pain. Must last 30 days.   HYDROcodone-acetaminophen 7.5-325 MG tablet Commonly known as: Norco Take 1 tablet by mouth 2 (two) times daily as needed for severe pain. Must last 30 days. Start taking on: July 04, 2020   hydrOXYzine 10 MG tablet Commonly known as: ATARAX/VISTARIL Take 1 tablet by mouth three times daily as needed   isosorbide mononitrate 60 MG 24 hr tablet Commonly known as: IMDUR TAKE 1 TABLET BY MOUTH EVERY DAY   meclizine 25 MG tablet Commonly known as: ANTIVERT TAKE 1 TABLET BY MOUTH THREE TIMES DAILY AS NEEDED FOR DIZZINESS   memantine 5 MG tablet Commonly known as: NAMENDA TAKE 2 TABLETS BY MOUTH EVERY DAY and TAKE 1 TABLET BY MOUTH EVERY DAY   metoprolol succinate 50 MG 24 hr tablet Commonly known as: TOPROL-XL Take 50 mg by mouth daily.   nitroGLYCERIN 0.4 MG SL tablet Commonly known as: Nitrostat Take one tablet under the tongue every 5 minutes as needed for chest pain   NovoLOG FlexPen 100 UNIT/ML FlexPen Generic drug: insulin aspart Inject 16 Units into the skin 3 (three) times daily with meals. Max daily dose 70 units   pantoprazole 40 MG tablet Commonly known as: PROTONIX TAKE 1 TABLET BY MOUTH EVERY DAY   pregabalin 25 MG capsule Commonly known as: LYRICA Take 1 capsule (25 mg total) by mouth 2 (two) times daily.   sodium polystyrene 15 GM/60ML suspension Commonly known as: SPS TAKE 41mls BY MOUTH ONCE A WEEK TO keep potassium down   topiramate 25 MG tablet Commonly known as: TOPAMAX TAKE 1 TABLET BY MOUTH EVERY DAY   Toujeo Max SoloStar 300 UNIT/ML Solostar Pen Generic drug: insulin glargine (2 Unit Dial) Inject 38 Units into the skin  daily. Patient assistance program provides   VITAMIN B-12 PO Take 1 tablet by mouth once a week.   Vitamin D (Ergocalciferol) 1.25 MG (50000 UNIT) Caps capsule Commonly known as: DRISDOL TAKE 1 CAPSULE BY MOUTH ONCE WEEKLY        PHYSICAL EXAM: VS: BP (!) 130/58   Pulse 80   SpO2 98%    EXAM: General: Pt appears well and is in NAD in a wheelchair   Lungs: Clear with good BS bilat with no rales, rhonchi, or wheezes  Heart: Auscultation: RRR with normal S1 and S2 Periph. circulation: no peripheral edema  Extremities:  BL LE: No pretibial edema   Mental Status: Judgment, insight: intact Mood and affect: no depression, anxiety, or agitation     DATA REVIEWED:  Lab Results  Component Value Date   HGBA1C 9.4 (A) 06/29/2020   HGBA1C 8.9 (A) 12/23/2019   HGBA1C 9.3 (H) 12/23/2019   Lab Results  Component Value Date   MICROALBUR 2.9 06/14/2017   LDLCALC 60 02/15/2020   CREATININE 1.98 (H) 03/03/2020   Lab Results  Component Value Date   MICRALBCREAT 81 (H) 06/14/2017     Lab Results  Component Value Date   CHOL 117 02/15/2020   HDL 40 (L) 02/15/2020   LDLCALC 60 02/15/2020   TRIG 85 02/15/2020   CHOLHDL 2.9 02/15/2020         ASSESSMENT / PLAN / RECOMMENDATIONS:   1)Type 2 Diabetes Mellitus, Poorly Controlled, With CKD III and macrovascular complications - Most recent A1c of 9.4 %. Goal A1c < 7.5 %.    -  Main barriers to diabetes care is memory loss.  - She has family around but no consistent presence of a single caregiver  - She does not take prandial insulin at times , hence hyperglycemia during the day - We discussed adding a GLP-1 agonist, cautioned against GI side effects  -      MEDICATIONS:  Continue Toujeo 44  units daily   Decrease  Novolog 14 units TID QAC  Start Trulicity 1.28 mg weekly   CF : BG- 150/30  EDUCATION / INSTRUCTIONS:  BG monitoring instructions: Patient is instructed to check her blood sugars 4 times a day, before meals and bedtime.  Call [facility] if: BG persistently < 70  . I reviewed the Rule of 15 for the treatment of hypoglycemia in detail with the patient. Literature supplied.    F/U in 3 months    Signed  electronically by: Mack Guise, MD  [name]  [facility] [address] [place], [place] [address] Phone: [phone] FAX: [phone]   CC: Nichole Chandler, NP [place] [address] Phone: [phone]  Fax: [phone]  Return to Endocrinology clinic as below: Future Appointments  Date Time Provider [place]  06/29/2020  3:00 PM Emari Hreha, [name], MD [facility]  07/20/2020 11:20 AM Gillis Santa, MD [facility] None  08/23/2020 11:30 AM [facility] None  08/23/2020 12:00 PM [name], MD [facility] None

## 2020-06-29 NOTE — Patient Instructions (Addendum)
-   Continue  Toujeo  44 units daily  - Decrease Novolog to 14 units with each meal  - Start trulicity 2.26 mg weekly   - Novolog correctional insulin: ADD extra units on insulin to your meal-time Novolog dose if your blood sugars are higher than 180. Use the scale below to help guide you:   Blood sugar before meal Number of units to inject  Less than 180 0 unit  181 -  210 1 units  211 -  240 2 units  241 -  270 3 units  271 -  300 4 units  301 -  330 5 units  331 -  360 6 units  361 -  390 7 units      HOW TO TREAT LOW BLOOD SUGARS (Blood sugar LESS THAN 70 MG/DL)  Please follow the RULE OF 15 for the treatment of hypoglycemia treatment (when your (blood sugars are less than 70 mg/dL)    STEP 1: Take 15 grams of carbohydrates when your blood sugar is low, which includes:   3-4 GLUCOSE TABS  OR  3-4 OZ OF JUICE OR REGULAR SODA OR  ONE TUBE OF GLUCOSE GEL     STEP 2: RECHECK blood sugar in 15 MINUTES STEP 3: If your blood sugar is still low at the 15 minute recheck --> then, go back to STEP 1 and treat AGAIN with another 15 grams of carbohydrates.   HOW TO TREAT LOW BLOOD SUGARS (Blood sugar LESS THAN 70 MG/DL)  Please follow the RULE OF 15 for the treatment of hypoglycemia treatment (when your (blood sugars are less than 70 mg/dL)    STEP 1: Take 15 grams of carbohydrates when your blood sugar is low, which includes:   3-4 GLUCOSE TABS  OR  3-4 OZ OF JUICE OR REGULAR SODA OR  ONE TUBE OF GLUCOSE GEL     STEP 2: RECHECK blood sugar in 15 MINUTES STEP 3: If your blood sugar is still low at the 15 minute recheck --> then, go back to STEP 1 and treat AGAIN with another 15 grams of carbohydrates.

## 2020-07-08 ENCOUNTER — Encounter: Payer: PPO | Admitting: Student in an Organized Health Care Education/Training Program

## 2020-07-12 ENCOUNTER — Other Ambulatory Visit: Payer: Self-pay | Admitting: Nurse Practitioner

## 2020-07-12 ENCOUNTER — Other Ambulatory Visit: Payer: Self-pay | Admitting: Hematology

## 2020-07-12 NOTE — Telephone Encounter (Signed)
Kindly review for refill. 

## 2020-07-20 ENCOUNTER — Ambulatory Visit
Payer: PPO | Attending: Student in an Organized Health Care Education/Training Program | Admitting: Student in an Organized Health Care Education/Training Program

## 2020-07-20 ENCOUNTER — Encounter: Payer: Self-pay | Admitting: Student in an Organized Health Care Education/Training Program

## 2020-07-20 ENCOUNTER — Other Ambulatory Visit: Payer: Self-pay

## 2020-07-20 VITALS — BP 135/68 | HR 79 | Temp 97.0°F | Resp 16 | Ht 67.0 in | Wt 338.0 lb

## 2020-07-20 DIAGNOSIS — M47816 Spondylosis without myelopathy or radiculopathy, lumbar region: Secondary | ICD-10-CM | POA: Insufficient documentation

## 2020-07-20 DIAGNOSIS — G8929 Other chronic pain: Secondary | ICD-10-CM | POA: Diagnosis not present

## 2020-07-20 DIAGNOSIS — M5416 Radiculopathy, lumbar region: Secondary | ICD-10-CM | POA: Diagnosis not present

## 2020-07-20 DIAGNOSIS — G894 Chronic pain syndrome: Secondary | ICD-10-CM | POA: Diagnosis not present

## 2020-07-20 DIAGNOSIS — M48062 Spinal stenosis, lumbar region with neurogenic claudication: Secondary | ICD-10-CM | POA: Diagnosis not present

## 2020-07-20 MED ORDER — OXYCODONE-ACETAMINOPHEN 5-325 MG PO TABS
1.0000 | ORAL_TABLET | Freq: Two times a day (BID) | ORAL | 0 refills | Status: AC | PRN
Start: 2020-08-19 — End: 2020-09-18

## 2020-07-20 MED ORDER — OXYCODONE-ACETAMINOPHEN 5-325 MG PO TABS
1.0000 | ORAL_TABLET | Freq: Two times a day (BID) | ORAL | 0 refills | Status: AC | PRN
Start: 2020-07-20 — End: 2020-08-19

## 2020-07-20 MED ORDER — OXYCODONE-ACETAMINOPHEN 5-325 MG PO TABS
1.0000 | ORAL_TABLET | Freq: Two times a day (BID) | ORAL | 0 refills | Status: DC | PRN
Start: 2020-09-18 — End: 2020-09-14

## 2020-07-20 NOTE — Progress Notes (Signed)
Nursing Pain Medication Assessment:  Safety precautions to be maintained throughout the outpatient stay will include: orient to surroundings, keep bed in low position, maintain call bell within reach at all times, provide assistance with transfer out of bed and ambulation.  Medication Inspection Compliance: Pill count conducted under aseptic conditions, in front of the patient. Neither the pills nor the bottle was removed from the patient's sight at any time. Once count was completed pills were immediately returned to the patient in their original bottle.  Medication: Hydrocodone/APAP Pill/Patch Count: 15 of 60 pills remain Pill/Patch Appearance: Markings consistent with prescribed medication Bottle Appearance: Standard pharmacy container. Clearly labeled. Filled Date: 1 / 35 / 2022 Last Medication intake:  Today

## 2020-07-20 NOTE — Progress Notes (Signed)
PROVIDER NOTE: Information contained herein reflects review and annotations entered in association with encounter. Interpretation of such information and data should be left to medically-trained personnel. Information provided to patient can be located elsewhere in the medical record under "Patient Instructions". Document created using STT-dictation technology, any transcriptional errors that may result from process are unintentional.    Patient: [name]  Service Category: E/M  Provider: Gillis Santa, MD  DOB: [birth year]  DOS: 07/20/2020  Specialty: Interventional Pain Management  MRN: [id number]  Setting: Ambulatory outpatient  PCP: [name], NP  Type: Established Patient    Referring Provider: [name], NP  Location: Office  Delivery: Face-to-face     HPI  [name], a 86 y.o. year old female, is here today because of her Chronic radicular lumbar pain [M54.16, G89.29]. Ms. Dula primary complain today is Back Pain and Pain (Right hand pain) Last encounter: My last encounter with her was on 06/04/2020. Pertinent problems: Ms. Clinch has Severe episode of recurrent major depressive disorder, without psychotic features ([place]); Chronic radicular lumbar pain; Lumbar facet arthropathy; Spinal stenosis, lumbar region, with neurogenic claudication; Bilateral primary osteoarthritis of knee; and Pain management contract signed on their pertinent problem list. Pain Assessment: Severity of Chronic pain is reported as a 8 /10. Location: Back Left/left hip down front of leg to foot,  right hand10/10-dominatnt hand , hard to do ADL"s. Onset: More than a month ago. Quality: Aching,Constant. Timing: Constant. Modifying factor(s): medications. Vitals:  height is '5\' 7"'  (1.702 m) and weight is 338 lb (153.3 kg) (abnormal). Her temperature is 97 F (36.1 C) (abnormal). Her blood pressure is 135/68 and her pulse is 79. Her respiration is 16 and oxygen saturation is 100%.   Reason for  encounter: medication management.    Patient is not finding much benefits at hydrocodone 7.5 mg twice daily.  She states that it is only helping out her pain to a small extent but is resulting in sedation.  She would like to trial oxycodone which she states she has done in the past and it did not result in sedation and did provide analgesic benefit.  Risks and benefits of oxycodone discussed with patient and we will trial 5 mg twice daily as needed.  Pharmacotherapy Assessment   06/04/2020  06/04/2020   3  Hydrocodone-Acetamin 7.5-325  60.00  30  [place]  [id number]  [facility] ([id number])  0/0  15.00 MME  Medicare  [place]      Analgesic: Transition to Percocet 5 mg twice daily as needed, quantity 60/month  Monitoring: [place] PMP: PDMP reviewed during this encounter.       Pharmacotherapy: No side-effects or adverse reactions reported. Compliance: No problems identified. Effectiveness: Clinically acceptable.  Ignatius Specking, RN  07/20/2020 11:23 AM  Sign when Signing Visit Nursing Pain Medication Assessment:  Safety precautions to be maintained throughout the outpatient stay will include: orient to surroundings, keep bed in low position, maintain call bell within reach at all times, provide assistance with transfer out of bed and ambulation.  Medication Inspection Compliance: Pill count conducted under aseptic conditions, in front of the patient. Neither the pills nor the bottle was removed from the patient's sight at any time. Once count was completed pills were immediately returned to the patient in their original bottle.  Medication: Hydrocodone/APAP Pill/Patch Count: 15 of 60 pills remain Pill/Patch Appearance: Markings consistent with prescribed medication Bottle Appearance: Standard pharmacy container. Clearly labeled. Filled Date: 1 / 17 / 2022  Last Medication intake:  Today    UDS: No results found for: SUMMARY   ROS  Constitutional: Denies any fever or chills Gastrointestinal: No reported  hemesis, hematochezia, vomiting, or acute GI distress Musculoskeletal: Right hand pain Neurological: No reported episodes of acute onset apraxia, aphasia, dysarthria, agnosia, amnesia, paralysis, loss of coordination, or loss of consciousness  Medication Review  Cyanocobalamin, DULoxetine, Dulaglutide, FreeStyle Libre 14 Day Sensor, Vitamin D (Ergocalciferol), albuterol, anastrozole, aspirin, atorvastatin, budesonide-formoterol, cetirizine, febuxostat, fluticasone, furosemide, hydrALAZINE, hydrOXYzine, insulin aspart, insulin glargine (2 Unit Dial), isosorbide mononitrate, meclizine, memantine, metoprolol succinate, nitroGLYCERIN, oxyCODONE-acetaminophen, pantoprazole, pregabalin, sodium polystyrene, and topiramate  History Review  Allergy: Ms. Lawley is allergic to sulfonamide derivatives, aricept [donepezil hcl], and tramadol. Drug: Ms. Salamon  reports no history of drug use. Alcohol:  reports no history of alcohol use. Tobacco:  reports that she has never smoked. She has never used smokeless tobacco. Social: Ms. Faro  reports that she has never smoked. She has never used smokeless tobacco. She reports that she does not drink alcohol and does not use drugs. Medical:  has a past medical history of Allergy, Anemia, unspecified, Anxiety, Arthritis, Breast cancer ([place]), Chronic diastolic CHF (congestive heart failure) ([place]), CKD (chronic kidney disease), stage III ([place]), Coronary artery disease, Dementia ([place]), Depression, Diabetes mellitus, GERD (gastroesophageal reflux disease), Gout, unspecified, Headache, History of blood clots, Hyperlipidemia, Hypertension, Insomnia, Joint pain, Joint swelling, Morbid obesity ([place]), Myocardial infarction ([place]) (2004), Non-STEMI (non-ST elevated myocardial infarction) ([place]) (02/15/2020), Numbness, Obstructive sleep apnea, Osteoarthritis, Osteoarthritis, Osteoarthrosis, unspecified whether generalized or localized, lower leg, Pain, chronic, Polymyalgia rheumatica ([place]),  Pulmonary emboli ([place]) (01/2012), Urinary incontinence, Vertigo, and Wears dentures. Surgical: Ms. Tallman  has a past surgical history that includes Appendectomy; Cardiac catheterization; Gastric bypass (1977 ); blood clots/legs and lungs (2013); MI with stent placement (2004); left heart catheterization with coronary angiogram (N/A, 06/29/2014); Coronary angioplasty (2); Eye surgery (Bilateral); Colonoscopy; Abdominal hysterectomy; Breast lumpectomy with radioactive seed localization (Left, 11/05/2014); Excision of skin tag (Right, 11/05/2014); Breast lumpectomy (Left, 11/05/2014); Breast biopsy (Left, 07/22/2014); Breast biopsy (Left, 02/10/2013); Esophagogastroduodenoscopy (egd) with propofol (N/A, 11/07/2016); Pars plana vitrectomy (Right, 10/23/2018); Membrane peel (Right, 10/23/2018); LEFT HEART CATH AND CORONARY ANGIOGRAPHY (N/A, 02/17/2020); and CORONARY STENT INTERVENTION (N/A, 02/17/2020). Family: family history includes Arthritis in her father and sister; Breast cancer (age of onset: 13) in her mother; Diabetes in her father and sister; Heart disease in her cousin and mother; Hypertension in her father; Obesity in her sister; Throat cancer in her father.  Laboratory Chemistry Profile   Renal Lab Results  Component Value Date   BUN 32 (H) 03/03/2020   CREATININE 1.98 (H) 03/03/2020   LABCREA 36 06/14/2017   BCR 20 03/02/2020   GFR 49.91 (L) 07/31/2012   GFRAA 38 (L) 03/02/2020   GFRNONAA 25 (L) 03/03/2020     Hepatic Lab Results  Component Value Date   AST 10 (L) 02/23/2020   ALT 6 02/23/2020   ALBUMIN 2.9 (L) 02/23/2020   ALKPHOS 84 02/23/2020   LIPASE 39 08/27/2018     Electrolytes Lab Results  Component Value Date   NA 138 03/03/2020   K 4.0 03/03/2020   CL 104 03/03/2020   CALCIUM 8.6 (L) 03/03/2020   MG 1.4 (L) 10/24/2017   PHOS 3.7 10/09/2017     Bone Lab Results  Component Value Date   VD25OH 21.76 (L) 07/24/2019     Inflammation (CRP: Acute Phase) (ESR: Chronic  Phase) Lab Results  Component Value Date  CRP 1.0 (H) 01/30/2019   ESRSEDRATE 110 (H) 09/25/2014   LATICACIDVEN 1.5 01/25/2019       Note: Above Lab results reviewed.  Recent Imaging Review  DG ELBOW COMPLETE RIGHT (3+VIEW) CLINICAL DATA:  Elbow swelling  EXAM: RIGHT ELBOW - COMPLETE 3+ VIEW  COMPARISON:  None.  FINDINGS: Mild degenerative changes with early spurring in joint space narrowing. No acute bony abnormality. Specifically, no fracture, subluxation, or dislocation. No joint effusion.  IMPRESSION: No acute bony abnormality.  Electronically Signed   By: Rolm Baptise M.D.   On: 06/03/2020 18:43 Note: Reviewed        Physical Exam  General appearance: Well nourished, well developed, and well hydrated. In no apparent acute distress Mental status: Alert, oriented x 3 (person, place, & time)       Respiratory: No evidence of acute respiratory distress Eyes: PERLA Vitals: BP 135/68   Pulse 79   Temp (!) 97 F (36.1 C)   Resp 16   Ht '5\' 7"'  (1.702 m)   Wt (!) 338 lb (153.3 kg)   SpO2 100%   BMI 52.94 kg/m  BMI: Estimated body mass index is 52.94 kg/m as calculated from the following:   Height as of this encounter: '5\' 7"'  (1.702 m).   Weight as of this encounter: 338 lb (153.3 kg). Ideal: Ideal body weight: 61.6 kg (135 lb 12.9 oz) Adjusted ideal body weight: 98.3 kg (216 lb 10.9 oz)  Thoracic Spine Area Exam  Skin & Axial Inspection:No masses, redness, or swelling Alignment:Symmetrical Functional ONG:EXBM restricted ROM Stability:No instability detected Muscle Tone/Strength:Functionally intact. No obvious neuro-muscular anomalies detected. Sensory (Neurological):Unimpaired Muscle strength & Tone:No palpable anomalies  Lumbar Exam  Skin & Axial Inspection:No masses, redness, or swelling Alignment:Symmetrical Functional WUX:LKGM restricted ROM Stability:No instability detected Muscle Tone/Strength:Functionally intact. No obvious  neuro-muscular anomalies detected. Sensory (Neurological):Musculoskeletal pain pattern Palpation:No palpable anomalies Provocative Tests: Hyperextension/rotation test:Unable to perform Lumbar quadrant test (Kemp's test):Unable to perform Lateral bending test:Unable to perform Patrick's Maneuver:Unable to perform  Ambulation:Patient came in today in a wheel chair Gait:Modified gait pattern (slower gait speed, wider stride width, and longer stance duration) associated with morbid obesity Posture:Difficulty standing up straight, due to pain  Lower Extremity Exam    Side:Right lower extremity  Side:Left lower extremity  Stability:No instability observed  Stability:No instability observed  Skin & Extremity Inspection:Skin color, temperature, and hair growth are WNL. No peripheral edema or cyanosis. No masses, redness, swelling, asymmetry, or associated skin lesions. No contractures.  Skin & Extremity Inspection:Skin color, temperature, and hair growth are WNL. No peripheral edema or cyanosis. No masses, redness, swelling, asymmetry, or associated skin lesions. No contractures.  Functional WNU:UVOZ restricted ROMfor all joints of the lower extremity   Functional DGU:YQIH restricted ROMfor all joints of the lower extremity   Muscle Tone/Strength:Functionally intact. No obvious neuro-muscular anomalies detected.  Muscle Tone/Strength:Functionally intact. No obvious neuro-muscular anomalies detected.  Sensory (Neurological):Neurogenic pain pattern  Sensory (Neurological):Neurogenic pain pattern  DTR: Patellar:deferred today Achilles:deferred today Plantar:deferred today  DTR: Patellar:deferred today Achilles:deferred today Plantar:deferred today  Palpation:No palpable anomalies  Palpation:No palpable anomalies    Assessment   Status Diagnosis   Persistent Persistent Persistent 1. Chronic radicular lumbar pain   2. Lumbar facet arthropathy   3. Spinal stenosis, lumbar region, with neurogenic claudication   4. Chronic pain syndrome      Updated Problems: Problem  Bilateral Primary Osteoarthritis of Knee  Pain Management Contract Signed  Chronic Radicular Lumbar Pain  Lumbar Facet Arthropathy  Spinal Stenosis, Lumbar Region, With Neurogenic Claudication  Severe Episode of Recurrent Major Depressive Disorder, Without Psychotic Features ([place])    Plan of Care  [name] has a current medication list which includes the following long-term medication(s): albuterol, atorvastatin, budesonide-formoterol, cetirizine, duloxetine, febuxostat, fluticasone, furosemide, hydralazine, novolog flexpen, toujeo max solostar, isosorbide mononitrate, memantine, pantoprazole, pregabalin, and topiramate.  Pharmacotherapy (Medications Ordered): Meds ordered this encounter  Medications  . oxyCODONE-acetaminophen (PERCOCET) 5-325 MG tablet    Sig: Take 1 tablet by mouth 2 (two) times daily as needed for severe pain. Must last 30 days.    Dispense:  60 tablet    Refill:  0    Chronic Pain. (STOP Act - Not applicable). Fill one day early if closed on scheduled refill date.  [name] oxyCODONE-acetaminophen (PERCOCET) 5-325 MG tablet    Sig: Take 1 tablet by mouth 2 (two) times daily as needed for severe pain. Must last 30 days.    Dispense:  60 tablet    Refill:  0    Chronic Pain. (STOP Act - Not applicable). Fill one day early if closed on scheduled refill date.  [name] oxyCODONE-acetaminophen (PERCOCET) 5-325 MG tablet    Sig: Take 1 tablet by mouth 2 (two) times daily as needed for severe pain. Must last 30 days.    Dispense:  60 tablet    Refill:  0    Chronic Pain. (STOP Act - Not applicable). Fill one day early if closed on scheduled refill date.  Continue Cymbalta as prescribed.  Follow-up plan:   Return in about 3 months (around  10/20/2020) for Medication Management.      Interventional management options:  Considering:   Intra-articular knee Hyalgan injections can be done in wheelchair Patient is on Plavix and is morbidly obese and unable to get on fluoroscopy table for interventional therapy.   PRN Procedures:   None at this time      Recent Visits Date Type Provider Dept  06/03/20 Office Visit Gillis Santa, MD [facility]  Showing recent visits within past 90 days and meeting all other requirements Today's Visits Date Type Provider Dept  07/20/20 Office Visit Gillis Santa, MD [facility]  Showing today's visits and meeting all other requirements Future Appointments Date Type Provider Dept  10/12/20 Appointment Gillis Santa, MD [facility]  Showing future appointments within next 90 days and meeting all other requirements  I discussed the assessment and treatment plan with the patient. The patient was provided an opportunity to ask questions and all were answered. The patient agreed with the plan and demonstrated an understanding of the instructions.  Patient advised to call back or seek an in-person evaluation if the symptoms or condition worsens.  Duration of encounter: 30 minutes.  Note by: Gillis Santa, MD Date: 07/20/2020; Time: 11:50 AM

## 2020-08-13 ENCOUNTER — Telehealth: Payer: Self-pay | Admitting: Hematology

## 2020-08-13 NOTE — Telephone Encounter (Signed)
Rescheduled upcoming appointment due to provider's PAL. Patient is aware of changes. ?

## 2020-08-16 ENCOUNTER — Ambulatory Visit: Payer: PPO | Admitting: Podiatry

## 2020-08-16 ENCOUNTER — Encounter: Payer: Self-pay | Admitting: Podiatry

## 2020-08-16 ENCOUNTER — Other Ambulatory Visit: Payer: Self-pay

## 2020-08-16 DIAGNOSIS — B351 Tinea unguium: Secondary | ICD-10-CM | POA: Diagnosis not present

## 2020-08-16 DIAGNOSIS — M21969 Unspecified acquired deformity of unspecified lower leg: Secondary | ICD-10-CM | POA: Diagnosis not present

## 2020-08-16 DIAGNOSIS — E1169 Type 2 diabetes mellitus with other specified complication: Secondary | ICD-10-CM | POA: Diagnosis not present

## 2020-08-16 DIAGNOSIS — M79609 Pain in unspecified limb: Secondary | ICD-10-CM | POA: Diagnosis not present

## 2020-08-16 DIAGNOSIS — N183 Chronic kidney disease, stage 3 unspecified: Secondary | ICD-10-CM | POA: Diagnosis not present

## 2020-08-16 NOTE — Progress Notes (Signed)
This patient returns to my office for at risk foot care.  This patient requires this care by a professional since this patient will be at risk due to having diabetes and CKD. She presents to the office with her daughter.   This patient is unable to cut nails herself since the patient cannot reach her nails.These nails are painful walking and wearing shoes.  This patient presents for at risk foot care today.  General Appearance  Alert, conversant and in no acute stress.  Vascular  Dorsalis pedis and posterior tibial  pulses are palpable  bilaterally.  Capillary return is within normal limits  bilaterally. Temperature is within normal limits  bilaterally.  Neurologic  Senn-Weinstein monofilament wire test within normal limits  bilaterally. Muscle power within normal limits bilaterally.  Nails Thick disfigured discolored nails with subungual debris  from hallux to fifth toes bilaterally. No evidence of bacterial infection or drainage bilaterally.  Orthopedic  No limitations of motion  feet .  No crepitus or effusions noted.  No bony pathology or digital deformities noted.  Skin  normotropic skin with no porokeratosis noted bilaterally.  No signs of infections or ulcers noted.     Onychomycosis  Pain in right toes  Pain in left toes  Consent was obtained for treatment procedures.   Mechanical debridement of nails 1-5  bilaterally performed with a nail nipper.  Filed with dremel without incident.    Return office visit  3 months in [place].                   Told patient to return for periodic foot care and evaluation due to potential at risk complications. Patient was difficult since she was not cooperative for [place].  Later she took off her mask during my services.  She also accused me of not trimming all her nails.  I realized she has been previously been diagnosed with memory loss.     [name] DPM

## 2020-08-19 DIAGNOSIS — E1165 Type 2 diabetes mellitus with hyperglycemia: Secondary | ICD-10-CM | POA: Diagnosis not present

## 2020-08-23 ENCOUNTER — Inpatient Hospital Stay: Payer: PPO | Admitting: Hematology

## 2020-08-23 ENCOUNTER — Inpatient Hospital Stay: Payer: PPO

## 2020-09-06 ENCOUNTER — Other Ambulatory Visit: Payer: Self-pay

## 2020-09-06 DIAGNOSIS — C50812 Malignant neoplasm of overlapping sites of left female breast: Secondary | ICD-10-CM

## 2020-09-06 DIAGNOSIS — Z170 Estrogen receptor positive status [ER+]: Secondary | ICD-10-CM

## 2020-09-06 NOTE — Progress Notes (Signed)
.    Hematology/Oncology Clinic Followup Note  Date of service 09/07/20   Patient Care Team: [name], NP as PCP - General (Geriatric Medicine) [name], MD (Inactive) as PCP - Cardiology (Cardiology) [name], [name], MD (Inactive) as Consulting Physician (Cardiology) [name], MD as Consulting Physician (Ophthalmology) [name], MD as Consulting Physician (General Surgery) [name], MD (Inactive) as Referring Physician (Obstetrics and Gynecology) [name] [name], MD as Consulting Physician (Ophthalmology) [facility], [name], MD as Consulting Physician (Endocrinology) [name], MD as Consulting Physician (Hematology) [name], DO as Consulting Physician (Neurology)  CHIEF COMPLAINTS/PURPOSE OF CONSULTATION:  F/u for breast cancer.  DIAGNOSIS: left-sided presumed stage IA  (pT1c,pNx[cN0], cM0) invasive ductal carcinoma grade 2 out of 3, strongly ER +100%, strongly PR +100% and HER-2/neu negative. Given her high risk cardiac status lumpectomy had to be done under local anesthesia and sentinel lymph node biopsy was not possible. She has accompanying DCIS of intermediate grade present at margins. ECOG performance status is 3. -Had a mammogram/tomosynthesis done on 07/29/2015 which shows residual suspicious calcification in the left breast postero-medial to the biopsy site. Has known residual DCIS.  No evidence of new malignancy.  No evidence of right breast malignancy.   CURRENT TREATMENT: Arimidex 52m po daily  HISTORY OF PRESENTING ILLNESS: -please see my initial consultation for details of initial presentation   INTERVAL HISTORY: Mrs. CRudderis here for management and evaluation of her breast cancer. We are joined today by her daughter, PJeannene Patella [name] last visit with [facility] on 02/23/2020. The pt reports that she is doing well overall.  The pt reports that she has been very fatigued recently. The pt has been following up  with all of her other doctors and she has been feeling comfortable and all conditions stable. The pt notes she has had been having difficulty sleeping at night, unsure as to why. The pt notes no issues tolerating her continued treatment and notes no new changes in her breasts. The pt notes that her joint and back pain has been improved since seeing pain management. The pt notes that she still does not have help at home outside of family due to elevated costs of care for home aid. The pt continues to be on the Plavix and ASA. The pt notes that she is so weak that she cannot stand or walk at all. The pt notes that she is completely ambulatory, laying in the bed all day. The pt notes she does not have the energy nor desire to do anything such as put her clothes on or go sit on the porch/living room.  Lab results today 09/07/2020 of CBC w/diff and CMP is as follows: all values are WNL except for WBC of 12.6K, RBC of 3.18, Hgb of 8.5, HCT of 28.6, MCHC of 29.7, Neutro Abs of 8.9K, CO2 of 21, Glucose of 302, BUN of 36, Creatinine of 1.65, Calcium of 8.4, Albumin of 2.8, AST of 12, GFR est of 31. 09/07/2020 Ferritin of 27.  On review of systems, pt reports fatigue, difficulty sleeping, weakness, joint pain and denies acute breast pain, breast symptoms, decreased appetite, bleeding issues, bloody/black stools, leg swelling, and any other symptoms.  MEDICAL HISTORY:  Past Medical History:  Diagnosis Date  . Allergy    takes Mucinex daily as needed  . Anemia, unspecified   . Anxiety    takes Clonazepam daily as needed  . Arthritis   . [place]   .  Chronic diastolic CHF (congestive heart failure) (HCC)    takes Furosemide daily  . CKD (chronic kidney disease), stage III ([place])   . Coronary artery disease    a. s/p IMI 2004 tx with BMS to RCA;  b. s/p Promus DES to RCA 2/12 c. abnormal nuc 2016 -> cath with med rx. d. NSTEMI 02/2020  mv CAD with severe ISR in pRCA and m/dRCA lesion both treated  with DES.  . Dementia ([place])   . Depression    takes Cymbalta daily  . Diabetes mellitus    insulin daily  . GERD (gastroesophageal reflux disease)    takes Protonix daily  . Gout, unspecified   . Headache    occasionally  . History of blood clots   . Hyperlipidemia    takes Pravastatin daily  . Hypertension   . Insomnia   . Joint pain   . Joint swelling   . Morbid obesity ([place])   . Myocardial infarction ([place]) 2004  . Non-STEMI (non-ST elevated myocardial infarction) ([place]) 02/15/2020  . Numbness   . Obstructive sleep apnea    does not wear cpap  . Osteoarthritis   . Osteoarthritis   . Osteoarthrosis, unspecified whether generalized or localized, lower leg   . Pain, chronic   . Polymyalgia rheumatica ([place])   . Pulmonary emboli ([place]) 01/2012   felt to need lifelong anticoagulation but Xarelto dc in 02/2020 due to need for DAPT  . Urinary incontinence    takes Linzess daily  . Vertigo    hx of;was taking Meclizine if needed  . Wears dentures     SURGICAL HISTORY: Past Surgical History:  Procedure Laterality Date  . ABDOMINAL HYSTERECTOMY     partial  . APPENDECTOMY    . blood clots/legs and lungs  2013  . BREAST BIOPSY Left 07/22/2014  . BREAST BIOPSY Left 02/10/2013  . BREAST LUMPECTOMY Left 11/05/2014  . BREAST LUMPECTOMY WITH RADIOACTIVE SEED LOCALIZATION Left 11/05/2014   Procedure: LEFT BREAST LUMPECTOMY WITH RADIOACTIVE SEED LOCALIZATION;  Surgeon: [name], MD;  Location: [facility];  Service: General;  Laterality: Left;  . CARDIAC CATHETERIZATION    . COLONOSCOPY    . CORONARY ANGIOPLASTY  2  . CORONARY STENT INTERVENTION N/A 02/17/2020   Procedure: CORONARY STENT INTERVENTION;  Surgeon: Leonie Man, MD;  Location: [facility];  Service: Cardiovascular;  Laterality: N/A;  . ESOPHAGOGASTRODUODENOSCOPY (EGD) WITH PROPOFOL N/A 11/07/2016   Procedure: ESOPHAGOGASTRODUODENOSCOPY (EGD) WITH PROPOFOL;  Surgeon: Gatha Mayer, MD;  Location: [facility]  ENDOSCOPY;  Service: Endoscopy;  Laterality: N/A;  . EXCISION OF SKIN TAG Right 11/05/2014   Procedure: EXCISION OF RIGHT EYELID SKIN TAG;  Surgeon: [name], MD;  Location: [facility];  Service: General;  Laterality: Right;  . EYE SURGERY Bilateral    cataract   . GASTRIC BYPASS  1977    reversed in 1979, [place] CATH AND CORONARY ANGIOGRAPHY N/A 02/17/2020   Procedure: LEFT HEART CATH AND CORONARY ANGIOGRAPHY;  Surgeon: Leonie Man, MD;  Location: [place] [facility];  Service: Cardiovascular;  Laterality: N/A;  . LEFT HEART CATHETERIZATION WITH CORONARY ANGIOGRAM N/A 06/29/2014   Procedure: LEFT HEART CATHETERIZATION WITH CORONARY ANGIOGRAM;  Surgeon: Troy Sine, MD;  Location: [facility] CATH LAB;  Service: Cardiovascular;  Laterality: N/A;  . MEMBRANE PEEL Right 10/23/2018   Procedure: MEMBRANE PEEL;  Surgeon: Hurman Horn, MD;  Location: [facility];  Service: Ophthalmology;  Laterality: Right;  [name] MI  with stent placement  2004  . PARS PLANA VITRECTOMY Right 10/23/2018   Procedure: PARS PLANA VITRECTOMY WITH 25 GAUGE;  Surgeon: Hurman Horn, MD;  Location: [place];  Service: Ophthalmology;  Laterality: Right;    SOCIAL HISTORY: Social History   Socioeconomic History  . Marital status: Widowed    Spouse name: Not on file  . Number of children: Not on file  . Years of education: Not on file  . Highest education level: Not on file  Occupational History  . Occupation: retired  Tobacco Use  . Smoking status: Never Smoker  . Smokeless tobacco: Never Used  Vaping Use  . Vaping Use: Never used  Substance and Sexual Activity  . Alcohol use: No    Alcohol/week: 0.0 standard drinks  . Drug use: No  . Sexual activity: Not Currently  Other Topics Concern  . Not on file  Social History Narrative  . Not on file   Social Determinants of Health   Financial Resource Strain: Not on file  Food Insecurity: Not on file  Transportation Needs: Not on file  Physical  Activity: Not on file  Stress: Not on file  Social Connections: Not on file  Intimate Partner Violence: Not on file    FAMILY HISTORY: Family History  Problem Relation Age of Onset  . Breast cancer Mother 24  . Heart disease Mother   . Throat cancer Father   . Hypertension Father   . Arthritis Father   . Diabetes Father   . Arthritis Sister   . Obesity Sister   . Diabetes Sister   . Heart disease Cousin   . Colon cancer Neg Hx   . Stomach cancer Neg Hx   . Esophageal cancer Neg Hx     ALLERGIES:  is allergic to sulfonamide derivatives, aricept [donepezil hcl], and tramadol.  MEDICATIONS:  Current Outpatient Medications  Medication Sig Dispense Refill  . albuterol (PROAIR HFA) 108 (90 Base) MCG/ACT inhaler Inhale 1-2 puffs into the lungs every 6 (six) hours as needed for wheezing or shortness of breath. 6.7 g 1  . anastrozole (ARIMIDEX) 1 MG tablet TAKE 1 TABLET BY MOUTH EVERY DAY 28 tablet 1  . atorvastatin (LIPITOR) 80 MG tablet TAKE 1 TABLET BY MOUTH EVERY DAY 30 tablet 3  . budesonide-formoterol (SYMBICORT) 160-4.5 MCG/ACT inhaler Inhale 2 puffs into the lungs 2 (two) times daily. For bronchitis 1 Inhaler 3  . cetirizine (ZYRTEC) 10 MG tablet Take 1 tablet (10 mg total) by mouth daily. 30 tablet 11  . Continuous Blood Gluc Sensor (FREESTYLE LIBRE 14 DAY SENSOR) MISC Inject 1 Device into the skin as directed. 6 each 3  . Cyanocobalamin (VITAMIN B-12 PO) Take 1 tablet by mouth once a week.     . Dulaglutide (TRULICITY) 5.72 IO/0.3TD SOPN Inject 0.75 mg into the skin once a week. 2 mL 6  . DULoxetine (CYMBALTA) 60 MG capsule TAKE 1 CAPSULE BY MOUTH EVERY DAY FOR ANXIETY 30 capsule 5  . febuxostat (ULORIC) 40 MG tablet TAKE 1 TABLET BY MOUTH EVERY DAY 30 tablet 5  . fluticasone (FLONASE) 50 MCG/ACT nasal spray Place 1 spray into both nostrils in the morning and at bedtime. 11.1 mL 3  . furosemide (LASIX) 20 MG tablet TAKE 1 TABLET BY MOUTH EVERY DAY AS NEEDED 30 tablet 1  .  GOODSENSE ASPIRIN LOW DOSE 81 MG EC tablet TAKE 1 TABLET BY MOUTH EVERY DAY SWALLOW WHOLE 30 tablet 11  . hydrALAZINE (APRESOLINE) 50 MG tablet  TAKE 1 TABLET BY MOUTH 2 TIMES DAILY 60 tablet 5  . hydrOXYzine (ATARAX/VISTARIL) 10 MG tablet Take 1 tablet by mouth three times daily as needed 90 tablet 1  . insulin aspart (NOVOLOG FLEXPEN) 100 UNIT/ML FlexPen Inject 16 Units into the skin 3 (three) times daily with meals. Max daily dose 70 units 63 mL 3  . insulin glargine, 2 Unit Dial, (TOUJEO MAX SOLOSTAR) 300 UNIT/ML Solostar Pen Inject 38 Units into the skin daily. Patient assistance program provides 15 mL 6  . isosorbide mononitrate (IMDUR) 60 MG 24 hr tablet TAKE 1 TABLET BY MOUTH EVERY DAY 30 tablet 5  . meclizine (ANTIVERT) 25 MG tablet TAKE 1 TABLET BY MOUTH THREE TIMES DAILY AS NEEDED FOR DIZZINESS 90 tablet 1  . memantine (NAMENDA) 5 MG tablet TAKE 2 TABLETS BY MOUTH EVERY DAY and TAKE 1 TABLET BY MOUTH EVERY DAY 90 tablet 5  . metoprolol succinate (TOPROL-XL) 50 MG 24 hr tablet Take 50 mg by mouth daily.    . nitroGLYCERIN (NITROSTAT) 0.4 MG SL tablet Take one tablet under the tongue every 5 minutes as needed for chest pain 50 tablet 0  . oxyCODONE-acetaminophen (PERCOCET) 5-325 MG tablet Take 1 tablet by mouth 2 (two) times daily as needed for severe pain. Must last 30 days. 60 tablet 0  . [START ON 09/18/2020] oxyCODONE-acetaminophen (PERCOCET) 5-325 MG tablet Take 1 tablet by mouth 2 (two) times daily as needed for severe pain. Must last 30 days. 60 tablet 0  . pantoprazole (PROTONIX) 40 MG tablet TAKE 1 TABLET BY MOUTH EVERY DAY 90 tablet 1  . pregabalin (LYRICA) 25 MG capsule Take 1 capsule (25 mg total) by mouth 2 (two) times daily. 60 capsule 3  . sodium polystyrene (SPS) 15 GM/60ML suspension TAKE 34ms BY MOUTH ONCE A WEEK TO keep potassium down 240 mL 2  . topiramate (TOPAMAX) 25 MG tablet TAKE 1 TABLET BY MOUTH EVERY DAY 30 tablet 5  . Vitamin D, Ergocalciferol, (DRISDOL) 1.25 MG  (50000 UNIT) CAPS capsule TAKE 1 CAPSULE BY MOUTH ONCE WEEKLY 4 capsule 1   No current facility-administered medications for this visit.    REVIEW OF SYSTEMS:   10 Point review of Systems was done is negative except as noted above.  PHYSICAL EXAMINATION:  ECOG PERFORMANCE STATUS: 4 - Bedbound  Vitals:   09/07/20 1409  BP: (!) 147/56  Pulse: 85  Resp: 19  Temp: (!) 97 F (36.1 C)  SpO2: 97%   Filed Weights   09/07/20 1409  Weight: (!) 330 lb 6.4 oz (149.9 kg)    Exam was given in a wheelchair.   GENERAL:alert, in no acute distress and comfortable SKIN: no acute rashes, no significant lesions EYES: conjunctiva are pink and non-injected, sclera anicteric OROPHARYNX: MMM, no exudates, no oropharyngeal erythema or ulceration NECK: supple, no JVD LYMPH:  no palpable lymphadenopathy in the cervical, axillary or inguinal regions LUNGS: clear to auscultation b/l with normal respiratory effort HEART: regular rate & rhythm ABDOMEN:  normoactive bowel sounds , non tender, not distended. Extremity: no pedal edema PSYCH: alert & oriented x 3 with fluent speech NEURO: no focal motor/sensory deficits  LABORATORY DATA:  I have reviewed the data as listed  . CBC Latest Ref Rng & Units 09/07/2020 03/03/2020 03/02/2020  WBC 4.0 - 10.5 K/uL 12.6(H) 11.3(H) 11.8(H)  Hemoglobin 12.0 - 15.0 g/dL 8.5(L) 9.3(L) 9.3(L)  Hematocrit 36.0 - 46.0 % 28.6(L) 32.5(L) 29.5(L)  Platelets 150 - 400 K/uL 256 283 254   .  CBC    Component Value Date/Time   WBC 12.6 (H) 09/07/2020 1345   WBC 11.3 (H) 03/03/2020 1237   RBC 3.18 (L) 09/07/2020 1345   HGB 8.5 (L) 09/07/2020 1345   HGB 9.3 (L) 03/02/2020 1517   HGB 8.7 (L) 03/21/2017 1351   HCT 28.6 (L) 09/07/2020 1345   HCT 29.5 (L) 03/02/2020 1517   HCT 28.9 (L) 03/21/2017 1351   PLT 256 09/07/2020 1345   PLT 254 03/02/2020 1517   MCV 89.9 09/07/2020 1345   MCV 88 03/02/2020 1517   MCV 84.3 03/21/2017 1351   MCH 26.7 09/07/2020 1345   MCHC  29.7 (L) 09/07/2020 1345   RDW 15.3 09/07/2020 1345   RDW 13.1 03/02/2020 1517   RDW 15.5 (H) 03/21/2017 1351   LYMPHSABS 2.8 09/07/2020 1345   LYMPHSABS 2.4 03/21/2017 1351   MONOABS 0.6 09/07/2020 1345   MONOABS 0.7 03/21/2017 1351   EOSABS 0.2 09/07/2020 1345   EOSABS 0.2 03/21/2017 1351   BASOSABS 0.0 09/07/2020 1345   BASOSABS 0.0 03/21/2017 1351   . CMP Latest Ref Rng & Units 09/07/2020 03/03/2020 03/02/2020  Glucose 70 - 99 mg/dL 302(H) 254(H) 277(H)  BUN 8 - 23 mg/dL 36(H) 32(H) 29(H)  Creatinine 0.44 - 1.00 mg/dL 1.65(H) 1.98(H) 1.47(H)  Sodium 135 - 145 mmol/L 140 138 141  Potassium 3.5 - 5.1 mmol/L 4.1 4.0 4.4  Chloride 98 - 111 mmol/L 107 104 103  CO2 22 - 32 mmol/L 21(L) 23 26  Calcium 8.9 - 10.3 mg/dL 8.4(L) 8.6(L) 8.6(L)  Total Protein 6.5 - 8.1 g/dL 7.2 - -  Total Bilirubin 0.3 - 1.2 mg/dL 0.4 - -  Alkaline Phos 38 - 126 U/L 89 - -  AST 15 - 41 U/L 12(L) - -  ALT 0 - 44 U/L 6 - -        RADIOGRAPHIC STUDIES: I have personally reviewed the radiological images as listed and agreed with the findings in the report.  No results found. Diagnostic mammogram b/l 11/01/17  IMPRESSION: No significant change in suspicious microcalcifications posterior to the lumpectomy site in the left breast compared to the mammogram of 2018. No suspicious findings in the right breast. Exclusion of some posterior tissue due to patient decreased mobility.  RECOMMENDATION: Diagnostic mammogram is suggested in 1 year. (Code:[id number]-B-01Y)  CLINICAL DATA:  Annual mammography. The patient had surgery for DCIS in the left breast in 2016. There were positive margins at surgery but the patient could not return for additional surgery. Known suspicious calcifications remain. EXAM: 2D DIGITAL DIAGNOSTIC BILATERAL MAMMOGRAM WITH CAD AND ADJUNCT TOMO COMPARISON:  Previous exam(s). ACR Breast Density Category b: There are scattered areas of fibroglandular density. FINDINGS: The suspicious  calcifications posterior to the left lumpectomy site are stable. No other interval changes or other suspicious findings. Mammographic images were processed with CAD. IMPRESSION: Continued suspicious calcifications posterior to the left lumpectomy site. No other changes. RECOMMENDATION: Continued annual mammography for surveillance. Continued oncologic follow up. I have discussed the findings and recommendations with the patient. Results were also provided in writing at the conclusion of the visit. If applicable, a reminder letter will be sent to the patient regarding the next appointment. BI-RADS CATEGORY  6: Known biopsy-proven malignancy.   Electronically Signed   By: Dorise Bullion III M.D   On: 08/16/2016 15:21   ASSESSMENT & PLAN:   Mrs. [name] is a very wonderful 86 y.o. African-American female with multiple medical comorbidities as described above with   #1  Left-sided presumed stage IA  (pT1c,pNx[cN0], cM0) invasive ductal carcinoma grade 2 out of 3, strongly ER +100%, strongly PR +100% and HER-2/neu negative. Given her high risk cardiac status lumpectomy had to be done under local anesthesia and sentinel lymph node biopsy was not possible. She has accompanying DCIS of intermediate grade present at margins. ECOG performance status is 3. Dexa scan "low bone mass" per WHO. -Bone density scan on 03/12/2017 with results showing: T-score of 0.5 at left forearm. Lumbar spine and dual femurs not completed due to patient in a wheelchair and not able to lay flat due to SOB while laying flat.   11/01/17 Mammogram stable.   #2 significant coronary artery disease with stress test in February 2016 suggesting likely multivessel disease. Has uncontrolled diabetes, hypertension, dyslipidemia, untreated sleep apnea, coronary disease, severe arthritis all of which are significantly limiting her quality of life.  #3 vitamin D deficiency - 25OH vitamin D level of 10, s/p high dose  ergocalciferol re-placement with 25OH Vit D improvement to 30.9 in 10/2017. Now on vit D 2000 units daily.   #4 Worsening Anemia ? Slow GI losses . Multiple metabolic insults in the bone marrow including uncontrolled diabetes .Patient notes that her fasting glucose and so and in the 300s   PLAN: -Discussed pt labwork today, 09/07/2020;  Hgb back lower at 8.5, Glucose elevated, other chemistries and coiunts stable. -Recommended pt continue to f/u regarding elevated and uncontrolled blood sugars. This will dehydrate her and cause some neuropathy and fatigue. -Advised pt that anemia related to CKD is treated by keeping iron levels higher than normal. Discussed potential for IV iron if levels are lower today. -Recommended pt f/u w PCP regarding inability to walk and standup. The pt notes that she does not talk to her PCP much due to the pt believing she does not help her at all and only refers her to other doctors. The pt notes much frustration. -Advised pt that her last A1C in February was 9.4 and this is suggestive of uncontrolled diabetes. This could be a major driver of the pt's fatigue. -Discussed hormone shots, but advised pt this is not suggested unless absolutely necessary due to pt's heart issues. -Recommended that pt check again with PCP/insurance regarding electric wheelchair to help get around better due to immobility. -Continue f/u w all related doctors for pt's current medical issues. -No lab or clinical evidence of Breast Cancer progression at this time. -Continue 1 mg Arimidex daily. No prohibitive toxicities at this time.  -Continue Vitamin B12 and Ergocalciferol.  -Will set up for IV Iron x 2 doses in 1 week. (goal to keep ferritin >100 in the context of CKD) -Will see back in 6 months with labs.   FOLLOW UP: RTC with Dr Irene Limbo with labs in 6 months   The total time spent in the appt was 20 minutes and more than 50% was on counseling and direct patient cares.  All of the  patient's questions were answered with apparent satisfaction. The patient knows to call the clinic with any problems, questions or concerns.   Sullivan Lone MD [place] [facility]/Oncology Physician [facility]  (Office):       [phone] (Work cell):  [phone] (Fax):           [phone]  I, Reinaldo Raddle, am acting as scribe for Dr. Sullivan Lone, MD.  .I have reviewed the above documentation for accuracy and completeness, and I agree with the above. [name] MD

## 2020-09-07 ENCOUNTER — Inpatient Hospital Stay: Payer: PPO | Attending: Hematology

## 2020-09-07 ENCOUNTER — Other Ambulatory Visit: Payer: Self-pay

## 2020-09-07 ENCOUNTER — Inpatient Hospital Stay: Payer: PPO | Admitting: Hematology

## 2020-09-07 VITALS — BP 147/56 | HR 85 | Temp 97.0°F | Resp 19 | Wt 330.4 lb

## 2020-09-07 DIAGNOSIS — D649 Anemia, unspecified: Secondary | ICD-10-CM

## 2020-09-07 DIAGNOSIS — C50812 Malignant neoplasm of overlapping sites of left female breast: Secondary | ICD-10-CM

## 2020-09-07 DIAGNOSIS — Z79811 Long term (current) use of aromatase inhibitors: Secondary | ICD-10-CM | POA: Diagnosis not present

## 2020-09-07 DIAGNOSIS — D509 Iron deficiency anemia, unspecified: Secondary | ICD-10-CM

## 2020-09-07 DIAGNOSIS — Z170 Estrogen receptor positive status [ER+]: Secondary | ICD-10-CM | POA: Insufficient documentation

## 2020-09-07 DIAGNOSIS — C50919 Malignant neoplasm of unspecified site of unspecified female breast: Secondary | ICD-10-CM | POA: Insufficient documentation

## 2020-09-07 LAB — CBC WITH DIFFERENTIAL (CANCER CENTER ONLY)
Abs Immature Granulocytes: 0.03 10*3/uL (ref 0.00–0.07)
Basophils Absolute: 0 10*3/uL (ref 0.0–0.1)
Basophils Relative: 0 %
Eosinophils Absolute: 0.2 10*3/uL (ref 0.0–0.5)
Eosinophils Relative: 2 %
HCT: 28.6 % — ABNORMAL LOW (ref 36.0–46.0)
Hemoglobin: 8.5 g/dL — ABNORMAL LOW (ref 12.0–15.0)
Immature Granulocytes: 0 %
Lymphocytes Relative: 22 %
Lymphs Abs: 2.8 10*3/uL (ref 0.7–4.0)
MCH: 26.7 pg (ref 26.0–34.0)
MCHC: 29.7 g/dL — ABNORMAL LOW (ref 30.0–36.0)
MCV: 89.9 fL (ref 80.0–100.0)
Monocytes Absolute: 0.6 10*3/uL (ref 0.1–1.0)
Monocytes Relative: 5 %
Neutro Abs: 8.9 10*3/uL — ABNORMAL HIGH (ref 1.7–7.7)
Neutrophils Relative %: 71 %
Platelet Count: 256 10*3/uL (ref 150–400)
RBC: 3.18 MIL/uL — ABNORMAL LOW (ref 3.87–5.11)
RDW: 15.3 % (ref 11.5–15.5)
WBC Count: 12.6 10*3/uL — ABNORMAL HIGH (ref 4.0–10.5)
nRBC: 0 % (ref 0.0–0.2)

## 2020-09-07 LAB — CMP (CANCER CENTER ONLY)
ALT: 6 U/L (ref 0–44)
AST: 12 U/L — ABNORMAL LOW (ref 15–41)
Albumin: 2.8 g/dL — ABNORMAL LOW (ref 3.5–5.0)
Alkaline Phosphatase: 89 U/L (ref 38–126)
Anion gap: 12 (ref 5–15)
BUN: 36 mg/dL — ABNORMAL HIGH (ref 8–23)
CO2: 21 mmol/L — ABNORMAL LOW (ref 22–32)
Calcium: 8.4 mg/dL — ABNORMAL LOW (ref 8.9–10.3)
Chloride: 107 mmol/L (ref 98–111)
Creatinine: 1.65 mg/dL — ABNORMAL HIGH (ref 0.44–1.00)
GFR, Estimated: 31 mL/min — ABNORMAL LOW (ref >60)
Glucose, Bld: 302 mg/dL — ABNORMAL HIGH (ref 70–99)
Potassium: 4.1 mmol/L (ref 3.5–5.1)
Sodium: 140 mmol/L (ref 135–145)
Total Bilirubin: 0.4 mg/dL (ref 0.3–1.2)
Total Protein: 7.2 g/dL (ref 6.5–8.1)

## 2020-09-07 LAB — FERRITIN: Ferritin: 27 ng/mL (ref 11–307)

## 2020-09-09 ENCOUNTER — Other Ambulatory Visit: Payer: Self-pay | Admitting: Nurse Practitioner

## 2020-09-09 ENCOUNTER — Other Ambulatory Visit: Payer: Self-pay | Admitting: Hematology

## 2020-09-09 NOTE — Telephone Encounter (Signed)
Patient was due for a routine follow-up in March 2022 and needs to schedule

## 2020-09-13 ENCOUNTER — Telehealth: Payer: Self-pay | Admitting: Hematology

## 2020-09-13 DIAGNOSIS — D509 Iron deficiency anemia, unspecified: Secondary | ICD-10-CM | POA: Insufficient documentation

## 2020-09-13 NOTE — Telephone Encounter (Signed)
Called pt to schedule iron infusions per 5/9 sch msg. Spoke to pt's daughter who said that she wasn't sure that pt would need the iron infusions. Requested to speak to the nurse. I transferred them to desk nurse to discuss whether infusions are needed or not.

## 2020-09-14 ENCOUNTER — Telehealth (INDEPENDENT_AMBULATORY_CARE_PROVIDER_SITE_OTHER): Payer: PPO | Admitting: Nurse Practitioner

## 2020-09-14 ENCOUNTER — Other Ambulatory Visit: Payer: Self-pay | Admitting: Nurse Practitioner

## 2020-09-14 ENCOUNTER — Other Ambulatory Visit: Payer: Self-pay

## 2020-09-14 DIAGNOSIS — F322 Major depressive disorder, single episode, severe without psychotic features: Secondary | ICD-10-CM | POA: Diagnosis not present

## 2020-09-14 DIAGNOSIS — M1A9XX0 Chronic gout, unspecified, without tophus (tophi): Secondary | ICD-10-CM

## 2020-09-14 DIAGNOSIS — E1140 Type 2 diabetes mellitus with diabetic neuropathy, unspecified: Secondary | ICD-10-CM | POA: Diagnosis not present

## 2020-09-14 DIAGNOSIS — G44209 Tension-type headache, unspecified, not intractable: Secondary | ICD-10-CM

## 2020-09-14 DIAGNOSIS — I10 Essential (primary) hypertension: Secondary | ICD-10-CM

## 2020-09-14 DIAGNOSIS — F0390 Unspecified dementia without behavioral disturbance: Secondary | ICD-10-CM | POA: Diagnosis not present

## 2020-09-14 DIAGNOSIS — K219 Gastro-esophageal reflux disease without esophagitis: Secondary | ICD-10-CM

## 2020-09-14 DIAGNOSIS — I25118 Atherosclerotic heart disease of native coronary artery with other forms of angina pectoris: Secondary | ICD-10-CM | POA: Diagnosis not present

## 2020-09-14 DIAGNOSIS — I5032 Chronic diastolic (congestive) heart failure: Secondary | ICD-10-CM

## 2020-09-14 DIAGNOSIS — Z794 Long term (current) use of insulin: Secondary | ICD-10-CM | POA: Diagnosis not present

## 2020-09-14 MED ORDER — ATORVASTATIN CALCIUM 80 MG PO TABS: 1.0000 | ORAL_TABLET | Freq: Every day | ORAL | 3 refills | Status: DC

## 2020-09-14 NOTE — Progress Notes (Signed)
This service is provided via telemedicine  No vital signs collected/recorded due to the encounter was a telemedicine visit.   Location of patient (ex: home, work):  Home  Patient consents to a telephone visit:  Yes, see encounter dated 01/29/2020  Location of the provider (ex: office, home):  [place]  Name of any referring provider:  N/A  Names of all persons participating in the telemedicine service and their role in the encounter:  [name], Nurse Practitioner, Carroll Kinds, CMA, patient and Daughter Arville Go.  Time spent on call: 15 minutes with medical assistant

## 2020-09-14 NOTE — Progress Notes (Signed)
Careteam: Patient Care Team: [name], NP as PCP - General (Geriatric Medicine) [name], MD (Inactive) as PCP - Cardiology (Cardiology) [name], [name], MD (Inactive) as Consulting Physician (Cardiology) [name], MD as Consulting Physician (Ophthalmology) [name], MD as Consulting Physician (General Surgery) [name], MD (Inactive) as Referring Physician (Obstetrics and Gynecology) [name] [name], MD as Consulting Physician (Ophthalmology) [facility], [name], MD as Consulting Physician (Endocrinology) [name], MD as Consulting Physician (Hematology) [name], DO as Consulting Physician (Neurology)  Advanced Directive information    Allergies  Allergen Reactions  . Sulfonamide Derivatives Swelling    Mouth swelling  . Aricept [Donepezil Hcl] Diarrhea and Nausea And Vomiting    GI upset/loose stools  . Tramadol Nausea And Vomiting    Chief Complaint  Patient presents with  . Follow-up    Follow up on medications. Patient has been having chest pains since Saturday. No shortness of breath.   . Health Maintenance    Tetanus/Tdap, COVID vaccine (2nd booster) Eye exam     HPI: Patient is a 86 y.o. female for routine follow up.   CAD- pt has been having chest pains on and off for 4 days. Using ntg which has been effective. Reports blood pressure has been well controlled. No shortness of breath No increase in swelling. Will go and come.   Having a harder time standing up and walking. Going to pain management and Rx oxycodone. Her entire left side hurts. Reports she stays constipated but controlled with medication.   DM- followed by endocrinology, hard to manage blood sugars due to incontinent eating and caregivers.   CKD- going to nephrologist tomorrow.   Hx of breast cancer- continues on arimidex 1 mg daily   Anemia- ferritin in normal range, recommended iron infusion but she reports this made her sick.    Hyperlipidemia- continues on atorvastatin (needs refill)  Neuropathy- continues to take lyrica but unsure if it is really providing benefit.      Review of Systems:  Review of Systems  Constitutional: Negative for chills, fever and weight loss.  HENT: Negative for tinnitus.   Respiratory: Negative for cough, sputum production and shortness of breath.   Cardiovascular: Negative for chest pain, palpitations and leg swelling.  Gastrointestinal: Negative for abdominal pain, constipation, diarrhea and heartburn.  Genitourinary: Negative for dysuria, frequency and urgency.  Musculoskeletal: Positive for joint pain and myalgias. Negative for back pain and falls.  Skin: Negative.   Neurological: Negative for dizziness and headaches.  Psychiatric/Behavioral: Negative for depression and memory loss. The patient does not have insomnia.     Past Medical History:  Diagnosis Date  . Allergy    takes Mucinex daily as needed  . Anemia, unspecified   . Anxiety    takes Clonazepam daily as needed  . Arthritis   . Breast cancer ([place])   . Chronic diastolic CHF (congestive heart failure) (HCC)    takes Furosemide daily  . CKD (chronic kidney disease), stage III ([place])   . Coronary artery disease    a. s/p IMI 2004 tx with BMS to RCA;  b. s/p Promus DES to RCA 2/12 c. abnormal nuc 2016 -> cath with med rx. d. NSTEMI 02/2020  mv CAD with severe ISR in pRCA and m/dRCA lesion both treated with DES.  . Dementia ([place])   . Depression    takes Cymbalta daily  . Diabetes mellitus    insulin daily  . GERD (gastroesophageal reflux  disease)    takes Protonix daily  . Gout, unspecified   . Headache    occasionally  . History of blood clots   . Hyperlipidemia    takes Pravastatin daily  . Hypertension   . Insomnia   . Joint pain   . Joint swelling   . Morbid obesity ([place])   . Myocardial infarction ([place]) 2004  . Non-STEMI (non-ST elevated myocardial infarction) ([place]) 02/15/2020  . Numbness   .  Obstructive sleep apnea    does not wear cpap  . Osteoarthritis   . Osteoarthritis   . Osteoarthrosis, unspecified whether generalized or localized, lower leg   . Pain, chronic   . Polymyalgia rheumatica ([place])   . Pulmonary emboli ([place]) 01/2012   felt to need lifelong anticoagulation but Xarelto dc in 02/2020 due to need for DAPT  . Urinary incontinence    takes Linzess daily  . Vertigo    hx of;was taking Meclizine if needed  . Wears dentures    Past Surgical History:  Procedure Laterality Date  . ABDOMINAL HYSTERECTOMY     partial  . APPENDECTOMY    . blood clots/legs and lungs  2013  . BREAST BIOPSY Left 07/22/2014  . BREAST BIOPSY Left 02/10/2013  . BREAST LUMPECTOMY Left 11/05/2014  . BREAST LUMPECTOMY WITH RADIOACTIVE SEED LOCALIZATION Left 11/05/2014   Procedure: LEFT BREAST LUMPECTOMY WITH RADIOACTIVE SEED LOCALIZATION;  Surgeon: [name], MD;  Location: [facility];  Service: General;  Laterality: Left;  . CARDIAC CATHETERIZATION    . COLONOSCOPY    . CORONARY ANGIOPLASTY  2  . CORONARY STENT INTERVENTION N/A 02/17/2020   Procedure: CORONARY STENT INTERVENTION;  Surgeon: Leonie Man, MD;  Location: [facility];  Service: Cardiovascular;  Laterality: N/A;  . ESOPHAGOGASTRODUODENOSCOPY (EGD) WITH PROPOFOL N/A 11/07/2016   Procedure: ESOPHAGOGASTRODUODENOSCOPY (EGD) WITH PROPOFOL;  Surgeon: Gatha Mayer, MD;  Location: [facility];  Service: Endoscopy;  Laterality: N/A;  . EXCISION OF SKIN TAG Right 11/05/2014   Procedure: EXCISION OF RIGHT EYELID SKIN TAG;  Surgeon: [name], MD;  Location: [facility];  Service: General;  Laterality: Right;  . EYE SURGERY Bilateral    cataract   . GASTRIC BYPASS  1977    reversed in 1979, [place] CATH AND CORONARY ANGIOGRAPHY N/A 02/17/2020   Procedure: LEFT HEART CATH AND CORONARY ANGIOGRAPHY;  Surgeon: Leonie Man, MD;  Location: [facility];  Service: Cardiovascular;  Laterality: N/A;   . LEFT HEART CATHETERIZATION WITH CORONARY ANGIOGRAM N/A 06/29/2014   Procedure: LEFT HEART CATHETERIZATION WITH CORONARY ANGIOGRAM;  Surgeon: Troy Sine, MD;  Location: [facility] C[facility] LAB;  Service: Cardiovascular;  Laterality: N/A;  . MEMBRANE PEEL Right 10/23/2018   Procedure: MEMBRANE PEEL;  Surgeon: Hurman Horn, MD;  Location: [facility];  Service: Ophthalmology;  Laterality: Right;  . MI with stent placement  2004  . PARS PLANA VITRECTOMY Right 10/23/2018   Procedure: PARS PLANA VITRECTOMY WITH 25 GAUGE;  Surgeon: Hurman Horn, MD;  Location: [facility];  Service: Ophthalmology;  Laterality: Right;   Social History:   reports that she has never smoked. She has never used smokeless tobacco. She reports that she does not drink alcohol and does not use drugs.  Family History  Problem Relation Age of Onset  . Breast cancer Mother 46  . Heart disease Mother   . Throat cancer Father   . Hypertension Father   . Arthritis Father   .  Diabetes Father   . Arthritis Sister   . Obesity Sister   . Diabetes Sister   . Heart disease Cousin   . Colon cancer Neg Hx   . Stomach cancer Neg Hx   . Esophageal cancer Neg Hx     Medications: Patient's Medications  New Prescriptions   No medications on file  Previous Medications   ALBUTEROL (PROAIR HFA) 108 (90 BASE) MCG/ACT INHALER    Inhale 1-2 puffs into the lungs every 6 (six) hours as needed for wheezing or shortness of breath.   ANASTROZOLE (ARIMIDEX) 1 MG TABLET    TAKE 1 TABLET BY MOUTH EVERY DAY   ATORVASTATIN (LIPITOR) 80 MG TABLET    TAKE 1 TABLET BY MOUTH EVERY DAY   BUDESONIDE-FORMOTEROL (SYMBICORT) 160-4.5 MCG/ACT INHALER    Inhale 2 puffs into the lungs 2 (two) times daily. For bronchitis   CETIRIZINE (ZYRTEC) 10 MG TABLET    Take 1 tablet (10 mg total) by mouth daily.   CONTINUOUS BLOOD GLUC SENSOR (FREESTYLE LIBRE 14 DAY SENSOR) MISC    Inject 1 Device into the skin as directed.   CYANOCOBALAMIN (VITAMIN B-12 PO)    Take 1 tablet by mouth  once a week.    DULAGLUTIDE (TRULICITY) 1.61 WR/6.0AV SOPN    Inject 0.75 mg into the skin once a week.   DULOXETINE (CYMBALTA) 60 MG CAPSULE    TAKE 1 CAPSULE BY MOUTH EVERY DAY FOR ANXIETY   FEBUXOSTAT (ULORIC) 40 MG TABLET    TAKE 1 TABLET BY MOUTH EVERY DAY   FLUTICASONE (FLONASE) 50 MCG/ACT NASAL SPRAY    Place 1 spray into both nostrils in the morning and at bedtime.   FUROSEMIDE (LASIX) 20 MG TABLET    Take 1 tablet (20 mg total) by mouth daily as needed (APPOINTMENT OVERDUE).   GOODSENSE ASPIRIN LOW DOSE 81 MG EC TABLET    TAKE 1 TABLET BY MOUTH EVERY DAY SWALLOW WHOLE   HYDRALAZINE (APRESOLINE) 50 MG TABLET    TAKE 1 TABLET BY MOUTH 2 TIMES DAILY   HYDROXYZINE (ATARAX/VISTARIL) 10 MG TABLET    Take 1 tablet by mouth three times daily as needed   INSULIN ASPART (NOVOLOG FLEXPEN) 100 UNIT/ML FLEXPEN    Inject 16 Units into the skin 3 (three) times daily with meals. Max daily dose 70 units   INSULIN GLARGINE, 2 UNIT DIAL, (TOUJEO MAX SOLOSTAR) 300 UNIT/ML SOLOSTAR PEN    Inject 38 Units into the skin daily. Patient assistance program provides   ISOSORBIDE MONONITRATE (IMDUR) 60 MG 24 HR TABLET    TAKE 1 TABLET BY MOUTH EVERY DAY   MECLIZINE (ANTIVERT) 25 MG TABLET    TAKE 1 TABLET BY MOUTH THREE TIMES DAILY AS NEEDED FOR DIZZINESS   MEMANTINE (NAMENDA) 5 MG TABLET    TAKE 2 TABLETS BY MOUTH EVERY DAY and TAKE 1 TABLET BY MOUTH EVERY DAY   METOPROLOL SUCCINATE (TOPROL-XL) 50 MG 24 HR TABLET    Take 50 mg by mouth daily.   NITROGLYCERIN (NITROSTAT) 0.4 MG SL TABLET    Take one tablet under the tongue every 5 minutes as needed for chest pain   OXYCODONE-ACETAMINOPHEN (PERCOCET) 5-325 MG TABLET    Take 1 tablet by mouth 2 (two) times daily as needed for severe pain. Must last 30 days.   PANTOPRAZOLE (PROTONIX) 40 MG TABLET    TAKE 1 TABLET BY MOUTH EVERY DAY   PREGABALIN (LYRICA) 25 MG CAPSULE    Take 1 capsule (25 mg  total) by mouth 2 (two) times daily.   SODIUM POLYSTYRENE (SPS) 15 GM/60ML  SUSPENSION    TAKE 73mls BY MOUTH ONCE A WEEK TO keep potassium down   TOPIRAMATE (TOPAMAX) 25 MG TABLET    TAKE 1 TABLET BY MOUTH EVERY DAY   VITAMIN D, ERGOCALCIFEROL, (DRISDOL) 1.25 MG (50000 UNIT) CAPS CAPSULE    TAKE 1 CAPSULE BY MOUTH ONCE WEEKLY  Modified Medications   No medications on file  Discontinued Medications   OXYCODONE-ACETAMINOPHEN (PERCOCET) 5-325 MG TABLET    Take 1 tablet by mouth 2 (two) times daily as needed for severe pain. Must last 30 days.    Physical Exam:  There were no vitals filed for this visit. There is no height or weight on file to calculate BMI. Wt Readings from Last 3 Encounters:  09/07/20 (!) 330 lb 6.4 oz (149.9 kg)  07/20/20 (!) 338 lb (153.3 kg)  06/03/20 (!) 338 lb (153.3 kg)      Labs reviewed: Basic Metabolic Panel: Recent Labs    03/02/20 1516 03/02/20 1517 03/03/20 1237 09/07/20 1345  NA  --  141 138 140  K  --  4.4 4.0 4.1  CL  --  103 104 107  CO2  --  26 23 21*  GLUCOSE  --  277* 254* 302*  BUN  --  29* 32* 36*  CREATININE  --  1.47* 1.98* 1.65*  CALCIUM  --  8.6* 8.6* 8.4*  TSH 2.370  --   --   --    Liver Function Tests: Recent Labs    12/23/19 1021 02/23/20 1408 09/07/20 1345  AST 9* 10* 12*  ALT 7 6 6   ALKPHOS  --  84 89  BILITOT 0.3 <0.2* 0.4  PROT 6.9 7.9 7.2  ALBUMIN  --  2.9* 2.8*   No results for input(s): LIPASE, AMYLASE in the last 8760 hours. No results for input(s): AMMONIA in the last 8760 hours. CBC: Recent Labs    12/23/19 1021 02/14/20 1624 02/23/20 1408 03/02/20 1517 03/03/20 1237 09/07/20 1345  WBC 14.1*   < > 15.8* 11.8* 11.3* 12.6*  NEUTROABS 9,828*  --  11.6*  --   --  8.9*  HGB 9.0*   < > 9.5* 9.3* 9.3* 8.5*  HCT 28.9*   < > 31.7* 29.5* 32.5* 28.6*  MCV 87.8   < > 89.8 88 92.9 89.9  PLT 279   < > 278 254 283 256   < > = values in this interval not displayed.   Lipid Panel: Recent Labs    12/23/19 1021 02/15/20 0636  CHOL 117 117  HDL 39* 40*  LDLCALC 58 60  TRIG 114  85  CHOLHDL 3.0 2.9   TSH: Recent Labs    03/02/20 1516  TSH 2.370   A1C: Lab Results  Component Value Date   HGBA1C 9.4 (A) 06/29/2020     Assessment/Plan 1. Dementia without behavioral disturbance, unspecified dementia type ([place]) Ongoing, memory stable she has the help of her daughters in the home to help with medication management and ADLs  2. Type 2 diabetes mellitus with diabetic neuropathy, with long-term current use of insulin (HCC) -not controlled. Memory is an issue as well as inconsistent diet. Has follow up scheduled.  -continues on trulicity, toujeo and novolog.  -Encouraged dietary compliance, routine foot care/monitoring and to keep up with diabetic eye exams through ophthalmology   3. Essential hypertension -does not have blood pressure reading today but reports home readings  have been well controlled. Goal <140/90.   4. Coronary artery disease of native artery of native heart with stable angina pectoris ([place]) -had 2 episodes of chest pains over the last 4 days, responsive to ntg. She does not have a follow up with cardiology and due at the end of the month for routine follow up. Encouraged to call and set up appt. Continues on imdur 60 mg daily  5. Depression, major, single episode, severe ([place]) Stable on cymbalta.   6. Gastroesophageal reflux disease, unspecified whether esophagitis present -controlled on current regimen.   7. Chronic diastolic congestive heart failure (HCC) Stable, without worsening of shortness of breath or edema. Continues on lasix BID and metoprolol 50 mg daily   8. Morbid (severe) obesity due to excess calories ([place]) -noted, educated has been provided on weight loss through proper nutrition. Mobility limited due to pain.   9. Tension headache Controlled on topamax  10. Chronic gout without tophus, unspecified cause, unspecified site -she is currently on uloric, possible recent flare, no recent uric acid level. Will need to follow  up uric acid with next blood work.   Next appt: 4 months, blood work in office at time of visit.  Carlos American. Harle Battiest  [facility] [phone]    Virtual Visit via Deloris Ping, video  I connected with patient on 09/14/20 at 10:00 AM EDT by video and verified that I am speaking with the correct person using two identifiers.  Location: Patient: home Provider: [facility] [place]   I discussed the limitations, risks, security and privacy concerns of performing an evaluation and management service by telephone and the availability of in person appointments. I also discussed with the patient that there may be a patient responsible charge related to this service. The patient expressed understanding and agreed to proceed.   I discussed the assessment and treatment plan with the patient. The patient was provided an opportunity to ask questions and all were answered. The patient agreed with the plan and demonstrated an understanding of the instructions.   The patient was advised to call back or seek an in-person evaluation if the symptoms worsen or if the condition fails to improve as anticipated.  I provided 30 minutes of non-face-to-face time during this encounter.  Carlos American. Harle Battiest Avs printed and mailed

## 2020-09-15 ENCOUNTER — Other Ambulatory Visit: Payer: Self-pay | Admitting: *Deleted

## 2020-09-15 DIAGNOSIS — I2510 Atherosclerotic heart disease of native coronary artery without angina pectoris: Secondary | ICD-10-CM | POA: Diagnosis not present

## 2020-09-15 DIAGNOSIS — N183 Chronic kidney disease, stage 3 unspecified: Secondary | ICD-10-CM | POA: Diagnosis not present

## 2020-09-15 DIAGNOSIS — I129 Hypertensive chronic kidney disease with stage 1 through stage 4 chronic kidney disease, or unspecified chronic kidney disease: Secondary | ICD-10-CM | POA: Diagnosis not present

## 2020-09-15 DIAGNOSIS — N2581 Secondary hyperparathyroidism of renal origin: Secondary | ICD-10-CM | POA: Diagnosis not present

## 2020-09-15 DIAGNOSIS — D631 Anemia in chronic kidney disease: Secondary | ICD-10-CM | POA: Diagnosis not present

## 2020-09-15 DIAGNOSIS — E1122 Type 2 diabetes mellitus with diabetic chronic kidney disease: Secondary | ICD-10-CM | POA: Diagnosis not present

## 2020-09-15 NOTE — Telephone Encounter (Signed)
[name] with [facility] called and wanted to know if patient should be taking Plavix. Patient is requesting a refill.  Plavix 75mg  once daily is not in patient's current medication list.   Please Advise.   Also requesting a refill on Atorvastatin. Pended.

## 2020-09-16 ENCOUNTER — Telehealth: Payer: Self-pay | Admitting: Cardiology

## 2020-09-16 MED ORDER — ATORVASTATIN CALCIUM 80 MG PO TABS: 1.0000 | ORAL_TABLET | Freq: Every day | ORAL | 1 refills | Status: DC

## 2020-09-16 MED ORDER — CLOPIDOGREL BISULFATE 75 MG PO TABS: 75.0000 mg | ORAL_TABLET | Freq: Every day | ORAL | 1 refills | Status: DC

## 2020-09-16 NOTE — Telephone Encounter (Signed)
Pharmacist called to see if we can right patient a prescribe for Plavix. Her pcp had was the one that was sending that prescription in for the patient, but stated since she comes to our office. That her dr her should be the one going forward right that prescription for the patient. Please call  Edwena Blow [phone]

## 2020-09-16 NOTE — Telephone Encounter (Signed)
Dr. Johney Frame, Is it okay to fill her Plavix? When should she come back to see you? She saw Truitt Merle NP 04/2020 and due for follow up 10/2020. Previously filled by her PCP.    Last OV note:   . CAD - prior NSTEMI back in October - s/p PCI to the RCA - she is on DAPT with aspirin and Plavix - off Xarelto per Oncology. No worrisome symptoms but overall prognosis is quite tenuous at best.

## 2020-09-16 NOTE — Telephone Encounter (Signed)
Spoke with Rachel Moulds at [facility], stated she will contact Cardiologist.

## 2020-09-16 NOTE — Telephone Encounter (Signed)
She was to continue plavix for 3-6 months (from December) per cardiology. She needs to follow up with her cardiologist at this time.

## 2020-09-16 NOTE — Telephone Encounter (Signed)
Absolutely can refill the plavix. She should follow-up in the next couple of months. Thank you so much!

## 2020-09-16 NOTE — Telephone Encounter (Signed)
Plavix sent in to the Pharmacy and pt to follow up in the next few months with Dr. Johney Frame.

## 2020-09-21 ENCOUNTER — Other Ambulatory Visit: Payer: Self-pay | Admitting: Nurse Practitioner

## 2020-09-21 DIAGNOSIS — R413 Other amnesia: Secondary | ICD-10-CM

## 2020-09-21 DIAGNOSIS — G629 Polyneuropathy, unspecified: Secondary | ICD-10-CM

## 2020-09-21 DIAGNOSIS — N183 Chronic kidney disease, stage 3 unspecified: Secondary | ICD-10-CM | POA: Diagnosis not present

## 2020-09-22 ENCOUNTER — Telehealth: Payer: Self-pay

## 2020-09-22 DIAGNOSIS — R413 Other amnesia: Secondary | ICD-10-CM

## 2020-09-22 MED ORDER — MEMANTINE HCL 5 MG PO TABS: ORAL_TABLET | ORAL | 5 refills | Status: DC

## 2020-09-22 NOTE — Telephone Encounter (Signed)
Message left on clinical intake voicemail (left late yesterday), retrieved this morning  [name] with [facility] called to question rx sent yesterday.   1.) Patient was previously on Namenda 5 mg 2 in the morning and 1 in the pm (this was verified by recent appointment on 09/14/2020). RX sent yesterday was for 1 am, 1 pm  2.) Dispense number for rx sent yesterday was 90 and if patient is to take rx as sent 1 tablet by mouth twice daily, the dispense should be 180 versus 90  Please review and advise

## 2020-09-22 NOTE — Telephone Encounter (Signed)
Correct namenda 5 mg should be 2 in the morning and 1 in the evening. For a 3 month supple she should get #270, for a one month supply she should get #90.

## 2020-09-23 ENCOUNTER — Other Ambulatory Visit: Payer: Self-pay

## 2020-09-23 DIAGNOSIS — G629 Polyneuropathy, unspecified: Secondary | ICD-10-CM

## 2020-09-23 MED ORDER — PREGABALIN 25 MG PO CAPS: 25.0000 mg | ORAL_CAPSULE | Freq: Two times a day (BID) | ORAL | 3 refills | Status: DC

## 2020-09-23 NOTE — Telephone Encounter (Signed)
[name] w/ [facility] was advised of the correction and reports that a new RX was send into pharmacy already as well.

## 2020-09-27 ENCOUNTER — Ambulatory Visit: Payer: PPO | Admitting: Internal Medicine

## 2020-09-28 ENCOUNTER — Telehealth: Payer: Self-pay | Admitting: Student in an Organized Health Care Education/Training Program

## 2020-09-28 NOTE — Telephone Encounter (Signed)
I called [facility], they will fill the script for Percocet, dated 09-18-20. Patient notified.

## 2020-09-29 ENCOUNTER — Ambulatory Visit: Payer: PPO | Admitting: Internal Medicine

## 2020-09-29 ENCOUNTER — Other Ambulatory Visit: Payer: Self-pay

## 2020-09-29 VITALS — BP 140/62 | HR 88 | Ht 67.0 in | Wt 332.0 lb

## 2020-09-29 DIAGNOSIS — E1165 Type 2 diabetes mellitus with hyperglycemia: Secondary | ICD-10-CM

## 2020-09-29 DIAGNOSIS — N1832 Chronic kidney disease, stage 3b: Secondary | ICD-10-CM

## 2020-09-29 DIAGNOSIS — E1140 Type 2 diabetes mellitus with diabetic neuropathy, unspecified: Secondary | ICD-10-CM

## 2020-09-29 DIAGNOSIS — E1122 Type 2 diabetes mellitus with diabetic chronic kidney disease: Secondary | ICD-10-CM | POA: Diagnosis not present

## 2020-09-29 DIAGNOSIS — Z794 Long term (current) use of insulin: Secondary | ICD-10-CM | POA: Diagnosis not present

## 2020-09-29 LAB — POCT GLUCOSE (DEVICE FOR HOME USE): POC Glucose: 211 mg/dl — AB (ref 70–99)

## 2020-09-29 LAB — POCT GLYCOSYLATED HEMOGLOBIN (HGB A1C): Hemoglobin A1C: 8 % — AB (ref 4.0–5.6)

## 2020-09-29 MED ORDER — TRULICITY 1.5 MG/0.5ML ~~LOC~~ SOAJ: 1.5000 mg | SUBCUTANEOUS | 3 refills | Status: DC

## 2020-09-29 MED ORDER — TOUJEO MAX SOLOSTAR 300 UNIT/ML ~~LOC~~ SOPN: 42.0000 [IU] | PEN_INJECTOR | Freq: Every day | SUBCUTANEOUS | 6 refills | Status: DC

## 2020-09-29 NOTE — Progress Notes (Signed)
Name: [name]  Age/ Sex: 86 y.o., female   MRN/ DOB: [id number], 1935/02/10     PCP: [name], NP   Reason for Endocrinology Evaluation: Type 2 Diabetes Mellitus     Initial Endocrinology Clinic Visit: 08/19/2018    PATIENT IDENTIFIER: [name] is a 86 y.o. female with a past medical history of T2DM, CKD III, and chronic pain syndrome . The patient has followed with Endocrinology clinic since 08/19/2018 for consultative assistance with management of her diabetes.  DIABETIC HISTORY:  [name] was diagnosed with T2DM > 40 yrs ago. Pt is a poor historian and unable to recall oral glycemic agents. She has been on insulin therapy for years. Her hemoglobin A1c has ranged from 6.7%  in 07/2017, peaking at 12.1% in 2018.  SUBJECTIVE:   During the last visit (12/23/2019): A1c 8.9 %  . We adjusted  MDI regimen.     Today (09/29/2020): [name] is here for a follow up visit on diabetes management. She is accompanied by her sister. They use the CGM at home . She does forget to take prandial insulin    Had an NSTEMI 02/2020 . S/P DES PCI   Denies nausea or diarrhea    HOME DIABETES REGIMEN:   Toujeo 44 units daily   Novolog 14 units TID - taking 16 units   CF: BG -150/30 with meals     CONTINUOUS GLUCOSE MONITORING RECORD INTERPRETATION Did not bring        HISTORY:  Past Medical History:  Past Medical History:  Diagnosis Date  . Allergy    takes Mucinex daily as needed  . Anemia, unspecified   . Anxiety    takes Clonazepam daily as needed  . Arthritis   . Breast cancer ([place])   . Chronic diastolic CHF (congestive heart failure) (H[facility])    takes Furosemide daily  . CKD (chronic kidney disease), stage III ([place])   . Coronary artery disease    a. s/p IMI 2004 tx with BMS to RCA;  b. s/p Promus DES to RCA 2/12 c. abnormal nuc 2016 -> cath with med rx. d. NSTEMI 02/2020  mv CAD with severe ISR in pRCA and m/dRCA lesion both treated with DES.   . Dementia ([place])   . Depression    takes Cymbalta daily  . Diabetes mellitus    insulin daily  . GERD (gastroesophageal reflux disease)    takes Protonix daily  . Gout, unspecified   . Headache    occasionally  . History of blood clots   . Hyperlipidemia    takes Pravastatin daily  . Hypertension   . Insomnia   . Joint pain   . Joint swelling   . Morbid obesity ([place])   . Myocardial infarction ([place]) 2004  . Non-STEMI (non-ST elevated myocardial infarction) ([place]) 02/15/2020  . Numbness   . Obstructive sleep apnea    does not wear cpap  . Osteoarthritis   . Osteoarthritis   . Osteoarthrosis, unspecified whether generalized or localized, lower leg   . Pain, chronic   . Polymyalgia rheumatica ([place])   . Pulmonary emboli ([place]) 01/2012   felt to need lifelong anticoagulation but Xarelto dc in 02/2020 due to need for DAPT  . Urinary incontinence    takes Linzess daily  . Vertigo    hx of;was taking Meclizine if needed  . Wears dentures    Past Surgical History:  Past Surgical History:  Procedure Laterality Date  . ABDOMINAL HYSTERECTOMY     partial  . APPENDECTOMY    . blood clots/legs and lungs  2013  . BREAST BIOPSY Left 07/22/2014  . BREAST BIOPSY Left 02/10/2013  . BREAST LUMPECTOMY Left 11/05/2014  . BREAST LUMPECTOMY WITH RADIOACTIVE SEED LOCALIZATION Left 11/05/2014   Procedure: LEFT BREAST LUMPECTOMY WITH RADIOACTIVE SEED LOCALIZATION;  Surgeon: [name], MD;  Location: [facility];  Service: General;  Laterality: Left;  . CARDIAC CATHETERIZATION    . COLONOSCOPY    . CORONARY ANGIOPLASTY  2  . CORONARY STENT INTERVENTION N/A 02/17/2020   Procedure: CORONARY STENT INTERVENTION;  Surgeon: Leonie Man, MD;  Location: [facility];  Service: Cardiovascular;  Laterality: N/A;  . ESOPHAGOGASTRODUODENOSCOPY (EGD) WITH PROPOFOL N/A 11/07/2016   Procedure: ESOPHAGOGASTRODUODENOSCOPY (EGD) WITH PROPOFOL;  Surgeon: Gatha Mayer, MD;  Location: [facility];   Service: Endoscopy;  Laterality: N/A;  . EXCISION OF SKIN TAG Right 11/05/2014   Procedure: EXCISION OF RIGHT EYELID SKIN TAG;  Surgeon: [name], MD;  Location: [facility];  Service: General;  Laterality: Right;  . EYE SURGERY Bilateral    cataract   . GASTRIC BYPASS  1977    reversed in 1979, [place] CATH AND CORONARY ANGIOGRAPHY N/A 02/17/2020   Procedure: LEFT HEART CATH AND CORONARY ANGIOGRAPHY;  Surgeon: Leonie Man, MD;  Location: [facility];  Service: Cardiovascular;  Laterality: N/A;  . LEFT HEART CATHETERIZATION WITH CORONARY ANGIOGRAM N/A 06/29/2014   Procedure: LEFT HEART CATHETERIZATION WITH CORONARY ANGIOGRAM;  Surgeon: Troy Sine, MD;  Location: [facility];  Service: Cardiovascular;  Laterality: N/A;  . MEMBRANE PEEL Right 10/23/2018   Procedure: MEMBRANE PEEL;  Surgeon: Hurman Horn, MD;  Location: [facility];  Service: Ophthalmology;  Laterality: Right;  . MI with stent placement  2004  . PARS PLANA VITRECTOMY Right 10/23/2018   Procedure: PARS PLANA VITRECTOMY WITH 25 GAUGE;  Surgeon: Hurman Horn, MD;  Location: [facility];  Service: Ophthalmology;  Laterality: Right;    Social History:  reports that she has never smoked. She has never used smokeless tobacco. She reports that she does not drink alcohol and does not use drugs. Family History:  Family History  Problem Relation Age of Onset  . Breast cancer Mother 15  . Heart disease Mother   . Throat cancer Father   . Hypertension Father   . Arthritis Father   . Diabetes Father   . Arthritis Sister   . Obesity Sister   . Diabetes Sister   . Heart disease Cousin   . Colon cancer Neg Hx   . Stomach cancer Neg Hx   . Esophageal cancer Neg Hx      HOME MEDICATIONS: Allergies as of 09/29/2020      Reactions   Sulfonamide Derivatives Swelling   Mouth swelling   Aricept [donepezil Hcl] Diarrhea, Nausea And Vomiting   GI upset/loose stools   Tramadol Nausea And Vomiting       Medication List       Accurate as of Sep 29, 2020  1:13 PM. If you have any questions, ask your nurse or doctor.        albuterol 108 (90 Base) MCG/ACT inhaler Commonly known as: ProAir HFA Inhale 1-2 puffs into the lungs every 6 (six) hours as needed for wheezing or shortness of breath.   anastrozole 1 MG tablet Commonly known as: ARIMIDEX TAKE 1 TABLET BY MOUTH  EVERY DAY   atorvastatin 80 MG tablet Commonly known as: LIPITOR Take 1 tablet (80 mg total) by mouth daily.   budesonide-formoterol 160-4.5 MCG/ACT inhaler Commonly known as: SYMBICORT Inhale 2 puffs into the lungs 2 (two) times daily. For bronchitis   cetirizine 10 MG tablet Commonly known as: ZYRTEC Take 1 tablet (10 mg total) by mouth daily.   clopidogrel 75 MG tablet Commonly known as: PLAVIX Take 1 tablet (75 mg total) by mouth daily.   DULoxetine 60 MG capsule Commonly known as: CYMBALTA TAKE 1 CAPSULE BY MOUTH EVERY DAY FOR ANXIETY   febuxostat 40 MG tablet Commonly known as: ULORIC TAKE 1 TABLET BY MOUTH EVERY DAY   fluticasone 50 MCG/ACT nasal spray Commonly known as: FLONASE Place 1 spray into both nostrils in the morning and at bedtime.   FreeStyle Libre 14 Day Sensor Misc Inject 1 Device into the skin as directed.   furosemide 20 MG tablet Commonly known as: LASIX TAKE 1 TABLET BY MOUTH EVERY DAY AS NEEDED (APPOINTMENT OVERDUE).]   GoodSense Aspirin Low Dose 81 MG EC tablet Generic drug: aspirin TAKE 1 TABLET BY MOUTH EVERY DAY SWALLOW WHOLE   hydrALAZINE 50 MG tablet Commonly known as: APRESOLINE TAKE 1 TABLET BY MOUTH 2 TIMES DAILY   hydrOXYzine 10 MG tablet Commonly known as: ATARAX/VISTARIL Take 1 tablet by mouth three times daily as needed   isosorbide mononitrate 60 MG 24 hr tablet Commonly known as: IMDUR TAKE 1 TABLET BY MOUTH EVERY DAY   meclizine 25 MG tablet Commonly known as: ANTIVERT TAKE 1 TABLET BY MOUTH THREE TIMES DAILY AS NEEDED FOR DIZZINESS   memantine 5  MG tablet Commonly known as: NAMENDA Take 2 by mouth in the am and 1 by mouth in the pm   metoprolol succinate 50 MG 24 hr tablet Commonly known as: TOPROL-XL Take 50 mg by mouth daily.   nitroGLYCERIN 0.4 MG SL tablet Commonly known as: Nitrostat Take one tablet under the tongue every 5 minutes as needed for chest pain   NovoLOG FlexPen 100 UNIT/ML FlexPen Generic drug: insulin aspart Inject 16 Units into the skin 3 (three) times daily with meals. Max daily dose 70 units   pantoprazole 40 MG tablet Commonly known as: PROTONIX TAKE 1 TABLET BY MOUTH EVERY DAY   pregabalin 25 MG capsule Commonly known as: LYRICA Take 1 capsule (25 mg total) by mouth 2 (two) times daily.   sodium polystyrene 15 GM/60ML suspension Commonly known as: SPS TAKE 62mls BY MOUTH ONCE A WEEK TO keep potassium down   topiramate 25 MG tablet Commonly known as: TOPAMAX TAKE 1 TABLET BY MOUTH EVERY DAY   Toujeo Max SoloStar 300 UNIT/ML Solostar Pen Generic drug: insulin glargine (2 Unit Dial) Inject 38 Units into the skin daily. Patient assistance program provides   Trulicity 0.10 UV/2.5DG Sopn Generic drug: Dulaglutide Inject 0.75 mg into the skin once a week.   VITAMIN B-12 PO Take 1 tablet by mouth once a week.   Vitamin D (Ergocalciferol) 1.25 MG (50000 UNIT) Caps capsule Commonly known as: DRISDOL TAKE 1 CAPSULE BY MOUTH ONCE WEEKLY       PHYSICAL EXAM: VS: BP 140/62   Pulse 88   Ht 5\' 7"  (1.702 m)   Wt (!) 332 lb (150.6 kg)   SpO2 97%   BMI 52.00 kg/m    EXAM: General: Pt appears well and is in NAD in a wheelchair   Lungs: Clear with good BS bilat with no rales, rhonchi,  or wheezes  Heart: Auscultation: RRR with normal S1 and S2 Periph. circulation: no peripheral edema  Extremities:  BL LE: No pretibial edema   Mental Status: Judgment, insight: intact Mood and affect: no depression, anxiety, or agitation   DM Foot Exam 09/29/2020   The skin of the feet is intact without  sores or ulcerations. The pedal pulses are 1+ on right and 1+ on left. The sensation is undetectable  to a screening 5.07, 10 gram monofilament bilaterally   DATA REVIEWED:  Lab Results  Component Value Date   HGBA1C 9.4 (A) 06/29/2020   HGBA1C 8.9 (A) 12/23/2019   HGBA1C 9.3 (H) 12/23/2019   Results for Nichole, [name] (MRN [id number]) as of 09/29/2020 13:14  Ref. Range 09/07/2020 13:45  Sodium Latest Ref Range: 135 - 145 mmol/L 140  Potassium Latest Ref Range: 3.5 - 5.1 mmol/L 4.1  Chloride Latest Ref Range: 98 - 111 mmol/L 107  CO2 Latest Ref Range: 22 - 32 mmol/L 21 (L)  Glucose Latest Ref Range: 70 - 99 mg/dL 302 (H)  BUN Latest Ref Range: 8 - 23 mg/dL 36 (H)  Creatinine Latest Ref Range: 0.44 - 1.00 mg/dL 1.65 (H)  Calcium Latest Ref Range: 8.9 - 10.3 mg/dL 8.4 (L)  Anion gap Latest Ref Range: 5 - 15  12  Alkaline Phosphatase Latest Ref Range: 38 - 126 U/L 89  Albumin Latest Ref Range: 3.5 - 5.0 g/dL 2.8 (L)  AST Latest Ref Range: 15 - 41 U/L 12 (L)  ALT Latest Ref Range: 0 - 44 U/L 6  Total Protein Latest Ref Range: 6.5 - 8.1 g/dL 7.2  Total Bilirubin Latest Ref Range: 0.3 - 1.2 mg/dL 0.4  GFR, Est Non African American Latest Ref Range: >60 mL/min 31 (L)   ASSESSMENT / PLAN / RECOMMENDATIONS:   1)Type 2 Diabetes Mellitus, Sub-Optimally  Controlled, With Neuropathic, CKD III and macrovascular complications - Most recent A1c of 8.0 %. Goal A1c < 7.5 %.      - A1c down from 9.4%  - Main barriers to diabetes care is memory loss.  - She has family around but no consistent presence of a single caregiver  - She does not take prandial insulin at times , hence hyperglycemia during the day - She tolerated the Trulicity without side effects but became cost prohibitive, pt assistance papers provided today , Will increased Trulicity as below    MEDICATIONS:  Decrease Toujeo 42  units daily   Decrease  Novolog 14 units TID QAC  Increase Trulicity 1.5  mg weekly   CF : BG-  150/30  EDUCATION / INSTRUCTIONS:  BG monitoring instructions: Patient is instructed to check her blood sugars 4 times a day, before meals and bedtime.  Call [facility] if: BG persistently < 70  . I reviewed the Rule of 15 for the treatment of hypoglycemia in detail with the patient. Literature supplied.    F/U in 6 months    Signed electronically by: Mack Guise, MD  [facility] End[facility]  [facility] [address] [place], [place] [address] Phone: [phone] FAX: [phone]   CC: [name], NP [place] [address] Phone: [phone]  Fax: [phone]  Return to Endocrinology clinic as below: Future Appointments  Date Time Provider [place]  09/29/2020  1:20 PM Brynnan Rodenbaugh, [name], MD [facility] None  10/12/2020  1:00 PM Gillis Santa, MD [facility] None  10/26/2020  1:45 PM [name],  DPM [facility]  01/18/2021 11:00 AM [facility] None  01/21/2021  1:00 PM [name], NP [facility] None  03/09/2021  2:00 PM [facility] None  03/09/2021  2:40 PM Kale, Cloria [place], MD [facility] None

## 2020-09-29 NOTE — Patient Instructions (Addendum)
-   Decrease  Toujeo  42 units daily  - Decrease Novolog to 14 units with each meal  - Increase Trulicity 1.5  mg weekly   - Novolog correctional insulin: ADD extra units on insulin to your meal-time Novolog dose if your blood sugars are higher than 180. Use the scale below to help guide you:   Blood sugar before meal Number of units to inject  Less than 180 0 unit  181 -  210 1 units  211 -  240 2 units  241 -  270 3 units  271 -  300 4 units  301 -  330 5 units  331 -  360 6 units  361 -  390 7 units      HOW TO TREAT LOW BLOOD SUGARS (Blood sugar LESS THAN 70 MG/DL)  Please follow the RULE OF 15 for the treatment of hypoglycemia treatment (when your (blood sugars are less than 70 mg/dL)    STEP 1: Take 15 grams of carbohydrates when your blood sugar is low, which includes:   3-4 GLUCOSE TABS  OR  3-4 OZ OF JUICE OR REGULAR SODA OR  ONE TUBE OF GLUCOSE GEL     STEP 2: RECHECK blood sugar in 15 MINUTES STEP 3: If your blood sugar is still low at the 15 minute recheck --> then, go back to STEP 1 and treat AGAIN with another 15 grams of carbohydrates.   HOW TO TREAT LOW BLOOD SUGARS (Blood sugar LESS THAN 70 MG/DL)  Please follow the RULE OF 15 for the treatment of hypoglycemia treatment (when your (blood sugars are less than 70 mg/dL)    STEP 1: Take 15 grams of carbohydrates when your blood sugar is low, which includes:   3-4 GLUCOSE TABS  OR  3-4 OZ OF JUICE OR REGULAR SODA OR  ONE TUBE OF GLUCOSE GEL     STEP 2: RECHECK blood sugar in 15 MINUTES STEP 3: If your blood sugar is still low at the 15 minute recheck --> then, go back to STEP 1 and treat AGAIN with another 15 grams of carbohydrates.

## 2020-10-07 ENCOUNTER — Other Ambulatory Visit: Payer: Self-pay | Admitting: Nurse Practitioner

## 2020-10-07 DIAGNOSIS — G629 Polyneuropathy, unspecified: Secondary | ICD-10-CM

## 2020-10-08 ENCOUNTER — Other Ambulatory Visit: Payer: Self-pay | Admitting: Internal Medicine

## 2020-10-08 ENCOUNTER — Telehealth: Payer: Self-pay

## 2020-10-08 DIAGNOSIS — E875 Hyperkalemia: Secondary | ICD-10-CM

## 2020-10-08 MED ORDER — SODIUM POLYSTYRENE SULFONATE 15 GM/60ML PO SUSP: ORAL | 2 refills | Status: DC

## 2020-10-08 NOTE — Telephone Encounter (Signed)
Patient request refill

## 2020-10-12 ENCOUNTER — Encounter: Payer: Self-pay | Admitting: Student in an Organized Health Care Education/Training Program

## 2020-10-12 ENCOUNTER — Ambulatory Visit
Payer: PPO | Attending: Student in an Organized Health Care Education/Training Program | Admitting: Student in an Organized Health Care Education/Training Program

## 2020-10-12 ENCOUNTER — Other Ambulatory Visit: Payer: Self-pay

## 2020-10-12 VITALS — BP 146/55 | HR 74 | Temp 97.9°F | Ht 67.0 in | Wt 332.0 lb

## 2020-10-12 DIAGNOSIS — M47816 Spondylosis without myelopathy or radiculopathy, lumbar region: Secondary | ICD-10-CM | POA: Insufficient documentation

## 2020-10-12 DIAGNOSIS — M25521 Pain in right elbow: Secondary | ICD-10-CM

## 2020-10-12 DIAGNOSIS — G8929 Other chronic pain: Secondary | ICD-10-CM | POA: Diagnosis not present

## 2020-10-12 DIAGNOSIS — G894 Chronic pain syndrome: Secondary | ICD-10-CM | POA: Diagnosis not present

## 2020-10-12 DIAGNOSIS — M5416 Radiculopathy, lumbar region: Secondary | ICD-10-CM

## 2020-10-12 DIAGNOSIS — M48062 Spinal stenosis, lumbar region with neurogenic claudication: Secondary | ICD-10-CM | POA: Diagnosis not present

## 2020-10-12 DIAGNOSIS — M170 Bilateral primary osteoarthritis of knee: Secondary | ICD-10-CM | POA: Diagnosis not present

## 2020-10-12 MED ORDER — OXYCODONE-ACETAMINOPHEN 5-325 MG PO TABS: 1.0000 | ORAL_TABLET | Freq: Two times a day (BID) | ORAL | 0 refills | Status: DC | PRN

## 2020-10-12 MED ORDER — OXYCODONE-ACETAMINOPHEN 5-325 MG PO TABS: 1.0000 | ORAL_TABLET | Freq: Two times a day (BID) | ORAL | 0 refills | Status: AC | PRN

## 2020-10-12 MED ORDER — DOCUSATE SODIUM 100 MG PO CAPS: 100.0000 mg | ORAL_CAPSULE | Freq: Every evening | ORAL | 2 refills | Status: AC | PRN

## 2020-10-12 NOTE — Progress Notes (Signed)
Safety precautions to be maintained throughout the outpatient stay will include: orient to surroundings, keep bed in low position, maintain call bell within reach at all times, provide assistance with transfer out of bed and ambulation.  

## 2020-10-12 NOTE — Progress Notes (Signed)
PROVIDER NOTE: Information contained herein reflects review and annotations entered in association with encounter. Interpretation of such information and data should be left to medically-trained personnel. Information provided to patient can be located elsewhere in the medical record under "Patient Instructions". Document created using STT-dictation technology, any transcriptional errors that may result from process are unintentional.    Patient: [name]  Service Category: E/M  Provider: Gillis Santa, MD  DOB: [birth year]  DOS: 10/12/2020  Specialty: Interventional Pain Management  MRN: [id number]  Setting: Ambulatory outpatient  PCP: [name], NP  Type: Established Patient    Referring Provider: [name], NP  Location: Office  Delivery: Face-to-face     HPI  [name], a 86 y.o. year old female, is here today because of her Chronic radicular lumbar pain [M54.16, G89.29]. Ms. Trick primary complain today is Shoulder Pain Last encounter: My last encounter with her was on 07/20/2020. Pertinent problems: Ms. Risser has Severe episode of recurrent major depressive disorder, without psychotic features ([place]); Chronic radicular lumbar pain; Lumbar facet arthropathy; Spinal stenosis, lumbar region, with neurogenic claudication; Bilateral primary osteoarthritis of knee; and Pain management contract signed on their pertinent problem list. Pain Assessment: Severity of Chronic pain is reported as a 8 /10. Location: Shoulder Left/pain radiaties down to her elbow. Onset: More than a month ago. Quality: Aching,Burning,Throbbing,Sharp. Timing: Constant. Modifying factor(s): meds. Vitals:  height is _0  (1.702 m) and weight is 332 lb (150.6 kg) (abnormal). Her temperature is 97.9 F (36.6 C). Her blood pressure is 146/55 (abnormal) and her pulse is 74. Her oxygen saturation is 100%.   Reason for encounter: medication management.    No change in medical history since last visit.   Patient's pain is at baseline.  Patient continues multimodal pain regimen as prescribed.  States that it provides pain relief and improvement in functional status. She is having issues with constipation so will prescribe Colace for patient to take daily and supplement with MiraLAX if she goes 1 to 2 days without a bowel movement.  Pharmacotherapy Assessment   09/28/2020  07/20/2020   2  Oxycodone-Acetaminophen 5-325  60.00  30  [place]  [id number]  [place] ([id number])  0/0  15.00 MME  Medicare  [place]     Analgesic: Percocet 5 mg twice daily as needed, quantity 60/month  Monitoring: [place] PMP: PDMP not reviewed this encounter.       Pharmacotherapy: Opioid-induced constipation (OIC)(K59.03, T40.2X5A) Compliance: No problems identified. Effectiveness: Clinically acceptable.  [name], RN  10/12/2020  1:19 PM  Sign when Signing Visit Safety precautions to be maintained throughout the outpatient stay will include: orient to surroundings, keep bed in low position, maintain call bell within reach at all times, provide assistance with transfer out of bed and ambulation.      UDS: : Final result   Visible to patient: Yes (not seen)   Next appt: 10/26/2020 at 01:45 PM in Podiatry [name], DPM)   Dx: Chronic pain syndrome   0 Result Notes   Ref Range & Units 6 mo ago  Amphetamines, IA Cutoff:50 ng/mL Negative   Barbiturates, IA Cutoff:0.1 ug/mL Negative   Benzodiazepines, IA Cutoff:20 ng/mL Negative   Cocaine & Metabolite, IA Cutoff:25 ng/mL Negative   Phencyclidine, IA Cutoff:8 ng/mL Negative   THC(Marijuana) Metabolite, IA Cutoff:5 ng/mL Negative   Opiates, IA Cutoff:5 ng/mL Negative   Oxycodones, IA Cutoff:5 ng/mL Negative   Comment: Presumptive immunoassay result indicated need for further  testing;  definitive confirmation was negative.   Methadone, IA Cutoff:25 ng/mL Negative   Propoxyphene, IA Cutoff:50 ng/mL Negative   Comment: This test was developed and its performance  characteristics  determined by [facility]. It has not been cleared or approved  by the Food and Drug Administration.         ROS  Constitutional: Denies any fever or chills Gastrointestinal: No reported hemesis, hematochezia, vomiting, or acute GI distress Musculoskeletal: left shoulder pain Neurological: No reported episodes of acute onset apraxia, aphasia, dysarthria, agnosia, amnesia, paralysis, loss of coordination, or loss of consciousness  Medication Review  Cyanocobalamin, DULoxetine, Dulaglutide, FreeStyle Libre 14 Day Sensor, Vitamin D (Ergocalciferol), albuterol, anastrozole, aspirin, atorvastatin, budesonide-formoterol, cetirizine, clopidogrel, docusate sodium, febuxostat, fluticasone, furosemide, hydrALAZINE, hydrOXYzine, insulin aspart, insulin glargine (2 Unit Dial), isosorbide mononitrate, meclizine, memantine, metoprolol succinate, nitroGLYCERIN, oxyCODONE-acetaminophen, pantoprazole, pregabalin, sodium polystyrene, and topiramate  History Review  Allergy: Ms. Nessler is allergic to sulfonamide derivatives, aricept [donepezil hcl], and tramadol. Drug: Ms. Zervas  reports no history of drug use. Alcohol:  reports no history of alcohol use. Tobacco:  reports that she has never smoked. She has never used smokeless tobacco. Social: Ms. Soria  reports that she has never smoked. She has never used smokeless tobacco. She reports that she does not drink alcohol and does not use drugs. Medical:  has a past medical history of Allergy, Anemia, unspecified, Anxiety, Arthritis, Breast cancer ([place]), Chronic diastolic CHF (congestive heart failure) ([place]), CKD (chronic kidney disease), stage III ([place]), Coronary artery disease, Dementia ([place]), Depression, Diabetes mellitus, GERD (gastroesophageal reflux disease), Gout, unspecified, Headache, History of blood clots, Hyperlipidemia, Hypertension, Insomnia, Joint pain, Joint swelling, Morbid obesity ([place]), Myocardial infarction ([place]) (2004), Non-STEMI  (non-ST elevated myocardial infarction) ([place]) (02/15/2020), Numbness, Obstructive sleep apnea, Osteoarthritis, Osteoarthritis, Osteoarthrosis, unspecified whether generalized or localized, lower leg, Pain, chronic, Polymyalgia rheumatica ([place]), Pulmonary emboli ([place]) (01/2012), Urinary incontinence, Vertigo, and Wears dentures. Surgical: Ms. Dils  has a past surgical history that includes Appendectomy; Cardiac catheterization; Gastric bypass (1977 ); blood clots/legs and lungs (2013); MI with stent placement (2004); left heart catheterization with coronary angiogram (N/A, 06/29/2014); Coronary angioplasty (2); Eye surgery (Bilateral); Colonoscopy; Abdominal hysterectomy; Breast lumpectomy with radioactive seed localization (Left, 11/05/2014); Excision of skin tag (Right, 11/05/2014); Breast lumpectomy (Left, 11/05/2014); Breast biopsy (Left, 07/22/2014); Breast biopsy (Left, 02/10/2013); Esophagogastroduodenoscopy (egd) with propofol (N/A, 11/07/2016); Pars plana vitrectomy (Right, 10/23/2018); Membrane peel (Right, 10/23/2018); LEFT HEART CATH AND CORONARY ANGIOGRAPHY (N/A, 02/17/2020); and CORONARY STENT INTERVENTION (N/A, 02/17/2020). Family: family history includes Arthritis in her father and sister; Breast cancer (age of onset: 50) in her mother; Diabetes in her father and sister; Heart disease in her cousin and mother; Hypertension in her father; Obesity in her sister; Throat cancer in her father.  Laboratory Chemistry Profile   Renal Lab Results  Component Value Date   BUN 36 (H) 09/07/2020   CREATININE 1.65 (H) 09/07/2020   LABCREA 36 06/14/2017   BCR 20 03/02/2020   GFR 49.91 (L) 07/31/2012   GFRAA 38 (L) 03/02/2020   GFRNONAA 31 (L) 09/07/2020     Hepatic Lab Results  Component Value Date   AST 12 (L) 09/07/2020   ALT 6 09/07/2020   ALBUMIN 2.8 (L) 09/07/2020   ALKPHOS 89 09/07/2020   LIPASE 39 08/27/2018     Electrolytes Lab Results  Component Value Date   NA 140 09/07/2020   K  4.1 09/07/2020   CL 107 09/07/2020   CALCIUM 8.4 (L) 09/07/2020   MG 1.4 (L)  10/24/2017   PHOS 3.7 10/09/2017     Bone Lab Results  Component Value Date   VD25OH 21.76 (L) 07/24/2019     Inflammation (CRP: Acute Phase) (ESR: Chronic Phase) Lab Results  Component Value Date   CRP 1.0 (H) 01/30/2019   ESRSEDRATE 110 (H) 09/25/2014   LATICACIDVEN 1.5 01/25/2019       Note: Above Lab results reviewed.  Recent Imaging Review  DG ELBOW COMPLETE RIGHT (3+VIEW) CLINICAL DATA:  Elbow swelling  EXAM: RIGHT ELBOW - COMPLETE 3+ VIEW  COMPARISON:  None.  FINDINGS: Mild degenerative changes with early spurring in joint space narrowing. No acute bony abnormality. Specifically, no fracture, subluxation, or dislocation. No joint effusion.  IMPRESSION: No acute bony abnormality.  Electronically Signed   By: Rolm Baptise M.D.   On: 06/03/2020 18:43 Note: Reviewed        Physical Exam  General appearance: Well nourished, well developed, and well hydrated. In no apparent acute distress Mental status: Alert, oriented x 3 (person, place, & time)       Respiratory: No evidence of acute respiratory distress Eyes: PERLA Vitals: BP (!) 146/55   Pulse 74   Temp 97.9 F (36.6 C)   Ht _0  (1.702 m)   Wt (!) 332 lb (150.6 kg)   SpO2 100%   BMI 52.00 kg/m  BMI: Estimated body mass index is 52 kg/m as calculated from the following:   Height as of this encounter: _1  (1.702 m).   Weight as of this encounter: 332 lb (150.6 kg). Ideal: Ideal body weight: 61.6 kg (135 lb 12.9 oz) Adjusted ideal body weight: 97.2 kg (214 lb 4.5 oz)  Thoracic Spine Area Exam  Skin & Axial Inspection:No masses, redness, or swelling Alignment:Symmetrical Functional OZH:YQMV restricted ROM Stability:No instability detected Muscle Tone/Strength:Functionally intact. No obvious neuro-muscular anomalies detected. Sensory (Neurological):Unimpaired Muscle strength & Tone:No palpable  anomalies  Bilateral shoulder pain  Lumbar Exam  Skin & Axial Inspection:No masses, redness, or swelling Alignment:Symmetrical Functional HQI:ONGE restricted ROM Stability:No instability detected Muscle Tone/Strength:Functionally intact. No obvious neuro-muscular anomalies detected. Sensory (Neurological):Musculoskeletal pain pattern Palpation:No palpable anomalies Provocative Tests: Hyperextension/rotation test:Unable to perform Lumbar quadrant test (Kemp's test):Unable to perform Lateral bending test:Unable to perform Patrick's Maneuver:Unable to perform  Ambulation:Patient came in today in a wheel chair Gait:Modified gait pattern (slower gait speed, wider stride width, and longer stance duration) associated with morbid obesity Posture:Difficulty standing up straight, due to pain  Lower Extremity Exam    Side:Right lower extremity  Side:Left lower extremity  Stability:No instability observed  Stability:No instability observed  Skin & Extremity Inspection:Skin color, temperature, and hair growth are WNL. No peripheral edema or cyanosis. No masses, redness, swelling, asymmetry, or associated skin lesions. No contractures.  Skin & Extremity Inspection:Skin color, temperature, and hair growth are WNL. No peripheral edema or cyanosis. No masses, redness, swelling, asymmetry, or associated skin lesions. No contractures.  Functional XBM:WUXL restricted ROMfor all joints of the lower extremity   Functional KGM:WNUU restricted ROMfor all joints of the lower extremity   Muscle Tone/Strength:Functionally intact. No obvious neuro-muscular anomalies detected.  Muscle Tone/Strength:Functionally intact. No obvious neuro-muscular anomalies detected.  Sensory (Neurological):Neurogenic pain pattern  Sensory (Neurological):Neurogenic pain pattern  DTR: Patellar:deferred  today Achilles:deferred today Plantar:deferred today  DTR: Patellar:deferred today Achilles:deferred today Plantar:deferred today  Palpation:No palpable anomalies  Palpation:No palpable anomalies    Assessment   Status Diagnosis  Persistent Persistent Persistent 1. Chronic radicular lumbar pain   2. Lumbar facet arthropathy  3. Spinal stenosis, lumbar region, with neurogenic claudication   4. Right elbow pain   5. Lumbar spondylosis   6. Bilateral primary osteoarthritis of knee   7. Chronic pain syndrome      Plan of Care  [name] has a current medication list which includes the following long-term medication(s): albuterol, atorvastatin, budesonide-formoterol, cetirizine, duloxetine, febuxostat, fluticasone, furosemide, hydralazine, toujeo max solostar, isosorbide mononitrate, memantine, novolog flexpen relion, pantoprazole, pregabalin, and topiramate.  Pharmacotherapy (Medications Ordered): Meds ordered this encounter  Medications  . oxyCODONE-acetaminophen (PERCOCET) 5-325 MG tablet    Sig: Take 1 tablet by mouth every 12 (twelve) hours as needed for severe pain. Must last 30 days.    Dispense:  60 tablet    Refill:  0    Chronic Pain: STOP Act (Not applicable) Fill 1 day early if closed on refill date. Avoid benzodiazepines within 8 hours of opioids  . oxyCODONE-acetaminophen (PERCOCET) 5-325 MG tablet    Sig: Take 1 tablet by mouth every 12 (twelve) hours as needed for severe pain. Must last 30 days.    Dispense:  60 tablet    Refill:  0    Chronic Pain: STOP Act (Not applicable) Fill 1 day early if closed on refill date. Avoid benzodiazepines within 8 hours of opioids  . docusate sodium (COLACE) 100 MG capsule    Sig: Take 1-2 capsules (100-200 mg total) by mouth at bedtime as needed for moderate constipation.    Dispense:  60 capsule    Refill:  2    Fill one day early if pharmacy is closed on scheduled refill date. Generic permitted. Do not  send renewal requests.  Continue Cymbalta as prescribed.  Follow-up plan:   Return in about 11 weeks (around 12/28/2020) for Medication Management, in person.      Interventional management options:  Considering:   Intra-articular knee Hyalgan injections can be done in wheelchair Patient is on Plavix and is morbidly obese and unable to get on fluoroscopy table for interventional therapy.   PRN Procedures:   None at this time      Recent Visits Date Type Provider Dept  07/20/20 Office Visit Gillis Santa, MD [facility]  Showing recent visits within past 90 days and meeting all other requirements Today's Visits Date Type Provider Dept  10/12/20 Office Visit Gillis Santa, MD [facility]  Showing today's visits and meeting all other requirements Future Appointments Date Type Provider Dept  12/21/20 Appointment Gillis Santa, MD [facility]  Showing future appointments within next 90 days and meeting all other requirements  I discussed the assessment and treatment plan with the patient. The patient was provided an opportunity to ask questions and all were answered. The patient agreed with the plan and demonstrated an understanding of the instructions.  Patient advised to call back or seek an in-person evaluation if the symptoms or condition worsens.  Duration of encounter: 30 minutes.  Note by: Gillis Santa, MD Date: 10/12/2020; Time: 1:58 PM

## 2020-10-26 ENCOUNTER — Ambulatory Visit: Payer: PPO | Admitting: Podiatry

## 2020-11-03 ENCOUNTER — Other Ambulatory Visit: Payer: Self-pay | Admitting: Hematology

## 2020-11-03 ENCOUNTER — Other Ambulatory Visit: Payer: Self-pay | Admitting: Nurse Practitioner

## 2020-11-03 DIAGNOSIS — G8929 Other chronic pain: Secondary | ICD-10-CM

## 2020-11-03 DIAGNOSIS — G629 Polyneuropathy, unspecified: Secondary | ICD-10-CM

## 2020-11-03 MED ORDER — PREGABALIN 25 MG PO CAPS: 25.0000 mg | ORAL_CAPSULE | Freq: Two times a day (BID) | ORAL | 5 refills | Status: DC

## 2020-11-03 NOTE — Addendum Note (Signed)
Addended by: Rafael Bihari A on: 11/03/2020 02:04 PM   Modules accepted: Orders

## 2020-11-03 NOTE — Telephone Encounter (Addendum)
Pharmacy requested  refill for lyrica Pended Rx and sent to [facility] for approval.

## 2020-11-09 ENCOUNTER — Telehealth: Payer: Self-pay

## 2020-11-09 NOTE — Telephone Encounter (Signed)
Pt have to increase her Oxycodone prescription she's taking 2 a day now, she was wondering if a nurse can call her back to let her know what else she can take so she want run out of her medication before her scheduled refill

## 2020-11-09 NOTE — Telephone Encounter (Signed)
Informed patient that it is prescribed to take 2 per day. Instructed her that she must not take more that prescribed, per Medication Agreement that she signed.

## 2020-11-16 ENCOUNTER — Ambulatory Visit (INDEPENDENT_AMBULATORY_CARE_PROVIDER_SITE_OTHER): Payer: PPO | Admitting: Family

## 2020-11-16 ENCOUNTER — Encounter: Payer: Self-pay | Admitting: Family

## 2020-11-16 ENCOUNTER — Other Ambulatory Visit: Payer: Self-pay

## 2020-11-16 VITALS — BP 138/80 | HR 79 | Temp 97.7°F | Resp 18 | Ht 67.0 in

## 2020-11-16 DIAGNOSIS — I5032 Chronic diastolic (congestive) heart failure: Secondary | ICD-10-CM

## 2020-11-16 DIAGNOSIS — R059 Cough, unspecified: Secondary | ICD-10-CM

## 2020-11-16 DIAGNOSIS — R0989 Other specified symptoms and signs involving the circulatory and respiratory systems: Secondary | ICD-10-CM

## 2020-11-16 LAB — POCT RAPID STREP A (OFFICE): Rapid Strep A Screen: NEGATIVE

## 2020-11-16 MED ORDER — DOXYCYCLINE HYCLATE 100 MG PO TABS: 100.0000 mg | ORAL_TABLET | Freq: Two times a day (BID) | ORAL | 0 refills | Status: AC

## 2020-11-16 MED ORDER — SACCHAROMYCES BOULARDII 250 MG PO CAPS: 250.0000 mg | ORAL_CAPSULE | Freq: Two times a day (BID) | ORAL | 0 refills | Status: DC

## 2020-11-16 MED ORDER — GUAIFENESIN ER 600 MG PO TB12: 600.0000 mg | ORAL_TABLET | Freq: Two times a day (BID) | ORAL | 0 refills | Status: AC

## 2020-11-16 NOTE — Progress Notes (Signed)
Provider: Theodoro Koval FNP-C  [name], NP  Patient Care Team: [name], NP as PCP - General (Geriatric Medicine) [name], MD (Inactive) as PCP - Cardiology (Cardiology) [name], [name], MD (Inactive) as Consulting Physician (Cardiology) [name], MD as Consulting Physician (Ophthalmology) [name], MD as Consulting Physician (General Surgery) [name], MD (Inactive) as Referring Physician (Obstetrics and Gynecology) [name] [name], MD as Consulting Physician (Ophthalmology) [facility], [name], MD as Consulting Physician (Endocrinology) [name], MD as Consulting Physician (Hematology) [name], DO as Consulting Physician (Neurology)  Extended Emergency Contact Information Primary Emergency Contact: Gore,Pamela Address: [name]          [place], [place] [address] Johnnette Litter of [place] Phone: [phone] Mobile Phone: [phone] Relation: Daughter Secondary Emergency Contact: Lanny Cramp, [place] [address] Johnnette Litter of [place] Phone: [phone] Mobile Phone: [phone] Relation: Daughter  Code Status:  DNR Goals of care: Advanced Directive information Advanced Directives 11/16/2020  Does Patient Have a Medical Advance Directive? Yes  Type of Advance Directive Out of facility DNR (pink MOST or yellow form)  Does patient want to make changes to medical advance directive? No - Patient declined  Copy of [place] in Chart? -  Would patient like information on creating a medical advance directive? -  Pre-existing out of facility DNR order (yellow form or pink MOST form) -     Chief Complaint  Patient presents with   Acute Visit    Cough,sore throat ,SOB,chills x 2 weeks      HPI:  Pt is a 86 y.o. female seen today for an acute visit for evaluation of cough,shortness of breath,chills,sore throat  x 2 weeks really bad.took robitussin has  not helped.coughing up yellow or green.shortness of breath worst with exertion.  Has had no fever. Has had left leg pain unable to stand up to give herself a bath.feels so weak  Has had loss of appetite x 1 month  Feels so stressed and depressed. Has not taken her trulicity due to lack of money.  Has lasix for leg swelling as needed.  No contact with sick person with COVID-19    Past Medical History:  Diagnosis Date   Allergy    takes Mucinex daily as needed   Anemia, unspecified    Anxiety    takes Clonazepam daily as needed   Arthritis    Breast cancer ([place])    Chronic diastolic CHF (congestive heart failure) (HCC)    takes Furosemide daily   CKD (chronic kidney disease), stage III ([place])    Coronary artery disease    a. s/p IMI 2004 tx with BMS to RCA;  b. s/p Promus DES to RCA 2/12 c. abnormal nuc 2016 -> cath with med rx. d. NSTEMI 02/2020  mv CAD with severe ISR in pRCA and m/dRCA lesion both treated with DES.   Dementia ([place])    Depression    takes Cymbalta daily   Diabetes mellitus    insulin daily   GERD (gastroesophageal reflux disease)    takes Protonix daily   Gout, unspecified    Headache    occasionally   History of blood clots    Hyperlipidemia    takes Pravastatin daily   Hypertension    Insomnia    Joint pain    Joint swelling    Morbid obesity ([place])    Myocardial infarction ([place])  2004   Non-STEMI (non-ST elevated myocardial infarction) ([place]) 02/15/2020   Numbness    Obstructive sleep apnea    does not wear cpap   Osteoarthritis    Osteoarthritis    Osteoarthrosis, unspecified whether generalized or localized, lower leg    Pain, chronic    Polymyalgia rheumatica ([place])    Pulmonary emboli ([place]) 01/2012   felt to need lifelong anticoagulation but Xarelto dc in 02/2020 due to need for DAPT   Urinary incontinence    takes Linzess daily   Vertigo    hx of;was taking Meclizine if needed   Wears dentures    Past Surgical History:  Procedure  Laterality Date   ABDOMINAL HYSTERECTOMY     partial   APPENDECTOMY     blood clots/legs and lungs  2013   BREAST BIOPSY Left 07/22/2014   BREAST BIOPSY Left 02/10/2013   BREAST LUMPECTOMY Left 11/05/2014   BREAST LUMPECTOMY WITH RADIOACTIVE SEED LOCALIZATION Left 11/05/2014   Procedure: LEFT BREAST LUMPECTOMY WITH RADIOACTIVE SEED LOCALIZATION;  Surgeon: [name], MD;  Location: [facility];  Service: General;  Laterality: Left;   CARDIAC CATHETERIZATION     COLONOSCOPY     CORONARY ANGIOPLASTY  2   CORONARY STENT INTERVENTION N/A 02/17/2020   Procedure: CORONARY STENT INTERVENTION;  Surgeon: Leonie Man, MD;  Location: [facility];  Service: Cardiovascular;  Laterality: N/A;   ESOPHAGOGASTRODUODENOSCOPY (EGD) WITH PROPOFOL N/A 11/07/2016   Procedure: ESOPHAGOGASTRODUODENOSCOPY (EGD) WITH PROPOFOL;  Surgeon: Gatha Mayer, MD;  Location: [facility];  Service: Endoscopy;  Laterality: N/A;   EXCISION OF SKIN TAG Right 11/05/2014   Procedure: EXCISION OF RIGHT EYELID SKIN TAG;  Surgeon: [name], MD;  Location: [facility];  Service: General;  Laterality: Right;   EYE SURGERY Bilateral    cataract    GASTRIC BYPASS  1977    reversed in 1979, [place] CATH AND CORONARY ANGIOGRAPHY N/A 02/17/2020   Procedure: LEFT HEART CATH AND CORONARY ANGIOGRAPHY;  Surgeon: Leonie Man, MD;  Location: [facility];  Service: Cardiovascular;  Laterality: N/A;   LEFT HEART CATHETERIZATION WITH CORONARY ANGIOGRAM N/A 06/29/2014   Procedure: LEFT HEART CATHETERIZATION WITH CORONARY ANGIOGRAM;  Surgeon: Troy Sine, MD;  Location: [facility];  Service: Cardiovascular;  Laterality: N/A;   MEMBRANE PEEL Right 10/23/2018   Procedure: MEMBRANE PEEL;  Surgeon: Hurman Horn, MD;  Location: [facility];  Service: Ophthalmology;  Laterality: Right;   MI with stent placement  2004   PARS PLANA VITRECTOMY Right 10/23/2018   Procedure: PARS PLANA VITRECTOMY WITH 25 GAUGE;   Surgeon: Hurman Horn, MD;  Location: [facility];  Service: Ophthalmology;  Laterality: Right;    Allergies  Allergen Reactions   Sulfonamide Derivatives Swelling    Mouth swelling   Aricept [Donepezil Hcl] Diarrhea and Nausea And Vomiting    GI upset/loose stools   Tramadol Nausea And Vomiting    Outpatient Encounter Medications as of 11/16/2020  Medication Sig   albuterol (PROAIR HFA) 108 (90 Base) MCG/ACT inhaler Inhale 1-2 puffs into the lungs every 6 (six) hours as needed for wheezing or shortness of breath.   anastrozole (ARIMIDEX) 1 MG tablet TAKE 1 TABLET BY MOUTH EVERY DAY   atorvastatin (LIPITOR) 80 MG tablet Take 1 tablet (80 mg total) by mouth daily.   budesonide-formoterol (SYMBICORT) 160-4.5 MCG/ACT inhaler Inhale 2 puffs into the lungs 2 (two) times daily. For bronchitis   cetirizine (ZYRTEC) 10  MG tablet Take 1 tablet (10 mg total) by mouth daily.   clopidogrel (PLAVIX) 75 MG tablet Take 1 tablet (75 mg total) by mouth daily.   Continuous Blood Gluc Sensor (FREESTYLE LIBRE 14 DAY SENSOR) MISC Inject 1 Device into the skin as directed.   Cyanocobalamin (VITAMIN B-12 PO) Take 1 tablet by mouth once a week.    docusate sodium (COLACE) 100 MG capsule Take 1-2 capsules (100-200 mg total) by mouth at bedtime as needed for moderate constipation.   doxycycline (VIBRA-TABS) 100 MG tablet Take 1 tablet (100 mg total) by mouth 2 (two) times daily for 10 days.   Dulaglutide (TRULICITY) 1.5 HY/8.5OY SOPN Inject 1.5 mg into the skin once a week.   DULoxetine (CYMBALTA) 60 MG capsule TAKE 1 CAPSULE BY MOUTH EVERY DAY FOR ANXIETY   febuxostat (ULORIC) 40 MG tablet TAKE 1 TABLET BY MOUTH EVERY DAY   fluticasone (FLONASE) 50 MCG/ACT nasal spray Place 1 spray into both nostrils in the morning and at bedtime.   furosemide (LASIX) 20 MG tablet TAKE 1 TABLET BY MOUTH EVERY DAY AS NEEDED (APPOINTMENT OVERDUE).]   GOODSENSE ASPIRIN LOW DOSE 81 MG EC tablet TAKE 1 TABLET BY MOUTH EVERY DAY SWALLOW  WHOLE   guaiFENesin (MUCINEX) 600 MG 12 hr tablet Take 1 tablet (600 mg total) by mouth 2 (two) times daily for 7 days.   hydrALAZINE (APRESOLINE) 50 MG tablet TAKE 1 TABLET BY MOUTH 2 TIMES DAILY   hydrOXYzine (ATARAX/VISTARIL) 10 MG tablet Take 1 tablet by mouth three times daily as needed   insulin glargine, 2 Unit Dial, (TOUJEO MAX SOLOSTAR) 300 UNIT/ML Solostar Pen Inject 42 Units into the skin daily. Patient assistance program provides   isosorbide mononitrate (IMDUR) 60 MG 24 hr tablet TAKE 1 TABLET BY MOUTH EVERY DAY   meclizine (ANTIVERT) 25 MG tablet TAKE 1 TABLET BY MOUTH THREE TIMES DAILY AS NEEDED FOR DIZZINESS   memantine (NAMENDA) 5 MG tablet Take 2 by mouth in the am and 1 by mouth in the pm   metoprolol succinate (TOPROL-XL) 50 MG 24 hr tablet Take 50 mg by mouth daily.   nitroGLYCERIN (NITROSTAT) 0.4 MG SL tablet Take one tablet under the tongue every 5 minutes as needed for chest pain   NOVOLOG FLEXPEN RELION 100 UNIT/ML FlexPen INJECT 16 UNITS SUBCUTANEOUSLY THREE TIMES DAILY WITH MEALS. MAX DAILY DOSE OF 70 UNITS.   oxyCODONE-acetaminophen (PERCOCET) 5-325 MG tablet Take 1 tablet by mouth every 12 (twelve) hours as needed for severe pain. Must last 30 days.   [START ON 11/29/2020] oxyCODONE-acetaminophen (PERCOCET) 5-325 MG tablet Take 1 tablet by mouth every 12 (twelve) hours as needed for severe pain. Must last 30 days.   pantoprazole (PROTONIX) 40 MG tablet TAKE 1 TABLET BY MOUTH EVERY DAY   pregabalin (LYRICA) 25 MG capsule Take 1 capsule (25 mg total) by mouth 2 (two) times daily.   saccharomyces boulardii (FLORASTOR) 250 MG capsule Take 1 capsule (250 mg total) by mouth 2 (two) times daily.   sodium polystyrene (SPS) 15 GM/60ML suspension TAKE 13mls BY MOUTH ONCE A WEEK TO keep potassium down   topiramate (TOPAMAX) 25 MG tablet TAKE 1 TABLET BY MOUTH EVERY DAY   Vitamin D, Ergocalciferol, (DRISDOL) 1.25 MG (50000 UNIT) CAPS capsule TAKE 1 CAPSULE BY MOUTH ONCE WEEKLY    No facility-administered encounter medications on file as of 11/16/2020.    Review of Systems  Constitutional:  Negative for appetite change, fatigue, fever and unexpected weight change.  Has had chills but none today   HENT:  Positive for sore throat. Negative for congestion, dental problem, ear discharge, ear pain, facial swelling, hearing loss, nosebleeds, postnasal drip, rhinorrhea, sinus pressure, sinus pain, sneezing, tinnitus and trouble swallowing.   Eyes:  Negative for pain, discharge, redness, itching and visual disturbance.  Respiratory:  Positive for cough. Negative for chest tightness, shortness of breath and wheezing.        Shortness of breath with exertion   Cardiovascular:  Negative for chest pain, palpitations and leg swelling.  Gastrointestinal:  Negative for abdominal distention, abdominal pain, blood in stool, constipation, diarrhea, nausea and vomiting.  Endocrine: Negative for cold intolerance, heat intolerance, polydipsia, polyphagia and polyuria.  Genitourinary:  Negative for difficulty urinating, dysuria, flank pain, frequency and urgency.  Musculoskeletal:  Positive for arthralgias and gait problem. Negative for back pain, joint swelling, myalgias, neck pain and neck stiffness.  Skin:  Negative for color change, pallor, rash and wound.  Neurological:  Negative for dizziness, syncope, speech difficulty, weakness, light-headedness, numbness and headaches.  Psychiatric/Behavioral:  Negative for agitation, behavioral problems, confusion and sleep disturbance. The patient is not nervous/anxious.        Memory lapse   Immunization History  Administered Date(s) Administered   Fluad Quad(high Dose 65+) 02/12/2019, 02/18/2020   Influenza Whole 03/14/2007, 02/13/2008, 01/18/2010   Influenza, High Dose Seasonal PF 01/16/2017, 03/11/2018   Influenza,inj,Quad PF,6+ Mos 01/08/2013, 02/18/2014, 01/18/2015, 01/24/2016   Influenza-Unspecified 02/15/2012, 02/18/2014    PFIZER(Purple Top)SARS-COV-2 Vaccination 07/06/2019, 08/05/2019, 12/30/2019   Pneumococcal Conjugate-13 02/27/2017   Pneumococcal Polysaccharide-23 05/08/2002   Td 05/08/2008   Pertinent  Health Maintenance Due  Topic Date Due   OPHTHALMOLOGY EXAM  08/26/2020   INFLUENZA VACCINE  12/06/2020   MAMMOGRAM  02/19/2021   FOOT EXAM  02/25/2021   HEMOGLOBIN A1C  04/01/2021   DEXA SCAN  Completed   PNA vac Low Risk Adult  Completed   Fall Risk  11/16/2020 10/12/2020 07/20/2020 06/03/2020 04/22/2020  Falls in the past year? 0 1 0 0 0  Number falls in past yr: 0 (No Data) 0 - 0  Comment - 1 month ago - - -  Injury with Fall? 0 1 0 - 0  Comment - bruise on right side - - -  Risk Factor Category  - - - - -  Risk for fall due to : No Fall Risks - - - -  Follow up Falls evaluation completed - - - -   Functional Status Survey:    Vitals:   11/16/20 1255  BP: 138/80  Pulse: 79  Resp: 18  Temp: 97.7 F (36.5 C)  SpO2: 95%  Height: 5\' 7"  (1.702 m)   Body mass index is 52 kg/m. Physical Exam Vitals reviewed.  Constitutional:      General: She is not in acute distress.    Appearance: Normal appearance. She is morbidly obese. She is not ill-appearing or diaphoretic.  HENT:     Head: Normocephalic.     Nose: Nose normal. No congestion or rhinorrhea.     Mouth/Throat:     Mouth: Mucous membranes are moist.     Pharynx: Oropharynx is clear. No oropharyngeal exudate or posterior oropharyngeal erythema.  Eyes:     General: No scleral icterus.       Right eye: No discharge.        Left eye: No discharge.     Extraocular Movements: Extraocular movements intact.     Conjunctiva/sclera: Conjunctivae normal.  Pupils: Pupils are equal, round, and reactive to light.  Neck:     Vascular: No carotid bruit.  Cardiovascular:     Rate and Rhythm: Normal rate and regular rhythm.     Pulses: Normal pulses.     Heart sounds: Normal heart sounds. No murmur heard.   No friction rub. No gallop.   Pulmonary:     Effort: Pulmonary effort is normal. No respiratory distress.     Breath sounds: No wheezing, rhonchi or rales.     Comments: Diminished breath sounds  Chest:     Chest wall: No tenderness.  Abdominal:     General: Bowel sounds are normal. There is no distension.     Palpations: Abdomen is soft. There is no mass.     Tenderness: There is no abdominal tenderness. There is no right CVA tenderness, left CVA tenderness, guarding or rebound.  Musculoskeletal:        General: No swelling or tenderness.     Cervical back: Normal range of motion. No rigidity or tenderness.     Right lower leg: No edema.     Left lower leg: No edema.     Comments: Unsteady gait on wheelchair during visit   Lymphadenopathy:     Cervical: No cervical adenopathy.  Neurological:     Mental Status: She is alert. Mental status is at baseline.     Cranial Nerves: No cranial nerve deficit.     Motor: No weakness.     Gait: Gait abnormal.  Psychiatric:        Mood and Affect: Mood normal.        Speech: Speech normal.        Behavior: Behavior normal.        Thought Content: Thought content normal.        Judgment: Judgment normal.    Labs reviewed: Recent Labs    03/02/20 1517 03/03/20 1237 09/07/20 1345  NA 141 138 140  K 4.4 4.0 4.1  CL 103 104 107  CO2 26 23 21*  GLUCOSE 277* 254* 302*  BUN 29* 32* 36*  CREATININE 1.47* 1.98* 1.65*  CALCIUM 8.6* 8.6* 8.4*   Recent Labs    12/23/19 1021 02/23/20 1408 09/07/20 1345  AST 9* 10* 12*  ALT 7 6 6   ALKPHOS  --  84 89  BILITOT 0.3 <0.2* 0.4  PROT 6.9 7.9 7.2  ALBUMIN  --  2.9* 2.8*   Recent Labs    12/23/19 1021 02/14/20 1624 02/23/20 1408 03/02/20 1517 03/03/20 1237 09/07/20 1345  WBC 14.1*   < > 15.8* 11.8* 11.3* 12.6*  NEUTROABS 9,828*  --  11.6*  --   --  8.9*  HGB 9.0*   < > 9.5* 9.3* 9.3* 8.5*  HCT 28.9*   < > 31.7* 29.5* 32.5* 28.6*  MCV 87.8   < > 89.8 88 92.9 89.9  PLT 279   < > 278 254 283 256   < > = values  in this interval not displayed.   Lab Results  Component Value Date   TSH 2.370 03/02/2020   Lab Results  Component Value Date   HGBA1C 8.0 (A) 09/29/2020   Lab Results  Component Value Date   CHOL 117 02/15/2020   HDL 40 (L) 02/15/2020   LDLCALC 60 02/15/2020   TRIG 85 02/15/2020   CHOLHDL 2.9 02/15/2020    Significant Diagnostic Results in last 30 days:  No results found.  Assessment/Plan  1. Symptoms of  upper respiratory infection (URI) Afebrile. Reports sore throat.negative exam findings. - SARS-COV-2 RNA,(COVID-19) QUAL NAAT - POC Rapid Strep A test result was negative - doxycycline (VIBRA-TABS) 100 MG tablet; Take 1 tablet (100 mg total) by mouth 2 (two) times daily for 10 days.  Dispense: 20 tablet; Refill: 0 - saccharomyces boulardii (FLORASTOR) 250 MG capsule; Take 1 capsule (250 mg total) by mouth 2 (two) times daily.  Dispense: 20 capsule; Refill: 0  2. Cough Bilateral lung sounds diminished.will treat clinically with antibiotics.  - doxycycline (VIBRA-TABS) 100 MG tablet; Take 1 tablet (100 mg total) by mouth 2 (two) times daily for 10 days.  Dispense: 20 tablet; Refill: 0 - saccharomyces boulardii (FLORASTOR) 250 MG capsule; Take 1 capsule (250 mg total) by mouth 2 (two) times daily.  Dispense: 20 capsule; Refill: 0 - guaiFENesin (MUCINEX) 600 MG 12 hr tablet; Take 1 tablet (600 mg total) by mouth 2 (two) times daily for 7 days.  Dispense: 14 tablet; Refill: 0  3. Chronic diastolic congestive heart failure (HCC) No edema. Continue on furosemide 20 mg tablet daily as needed. - advised to check weight at home and notify provider for any abrupt weight gain  Family/ staff Communication: Reviewed plan of care with patient and daughter   Labs/tests ordered:  - SARS-COV-2 RNA,(COVID-19) QUAL NAAT - POC Rapid Strep A test   Next Appointment: As needed if symptoms worsen or fail to improve    Sandrea Hughs, NP

## 2020-11-17 LAB — SARS-COV-2 RNA,(COVID-19) QUALITATIVE NAAT: SARS CoV2 RNA: NOT DETECTED

## 2020-11-23 ENCOUNTER — Telehealth: Payer: Self-pay

## 2020-11-23 NOTE — Telephone Encounter (Signed)
Lavaris Hammock calling on behalf of the patient stating she is having problems breathing. She has a blue inhaler (Dulera 24mcg-which is not on patient's med list) but but don't believe it is helping. Walking from her bed to chair gives her chest pain and SOB [phone].   To Webb Silversmith, NP

## 2020-11-23 NOTE — Telephone Encounter (Signed)
She will need to be sen in the ED as soon as possible for chest pain and shortness of breath.

## 2020-11-25 NOTE — Telephone Encounter (Signed)
Advised the patient to be seen in ED if chest pain & sob continues to be an issue. She says it has not gotten any better. Daughter wasn't available. I spoke with the patient.

## 2020-12-13 ENCOUNTER — Other Ambulatory Visit: Payer: Self-pay

## 2020-12-13 ENCOUNTER — Encounter: Payer: Self-pay | Admitting: Podiatry

## 2020-12-13 ENCOUNTER — Ambulatory Visit: Payer: PPO | Admitting: Podiatry

## 2020-12-13 DIAGNOSIS — N183 Chronic kidney disease, stage 3 unspecified: Secondary | ICD-10-CM

## 2020-12-13 DIAGNOSIS — M79609 Pain in unspecified limb: Secondary | ICD-10-CM

## 2020-12-13 DIAGNOSIS — E1169 Type 2 diabetes mellitus with other specified complication: Secondary | ICD-10-CM

## 2020-12-13 DIAGNOSIS — B351 Tinea unguium: Secondary | ICD-10-CM

## 2020-12-13 DIAGNOSIS — M21969 Unspecified acquired deformity of unspecified lower leg: Secondary | ICD-10-CM

## 2020-12-13 NOTE — Progress Notes (Signed)
This patient returns to my office for at risk foot care.  This patient requires this care by a professional since this patient will be at risk due to having diabetes and CKD. She presents to the office with her daughter in a wheelchair.  This patient is unable to cut nails herself since the patient cannot reach her nails.These nails are painful walking and wearing shoes.  This patient presents for at risk foot care today. ° °General Appearance  Alert, conversant and in no acute stress. ° °Vascular  Dorsalis pedis and posterior tibial  pulses are palpable  bilaterally.  Capillary return is within normal limits  bilaterally. Temperature is within normal limits  bilaterally. ° °Neurologic  Senn-Weinstein monofilament wire test within normal limits  bilaterally. Muscle power within normal limits bilaterally. ° °Nails Thick disfigured discolored nails with subungual debris  from hallux to fifth toes bilaterally. No evidence of bacterial infection or drainage bilaterally. ° °Orthopedic  No limitations of motion  feet .  No crepitus or effusions noted.  No bony pathology or digital deformities noted. Deviation of toes 2-5 right foot at the MPJ. ° °Skin  normotropic skin with no porokeratosis noted bilaterally.  No signs of infections or ulcers noted.    ° °Onychomycosis  Pain in right toes  Pain in left toes ° °Consent was obtained for treatment procedures.   Mechanical debridement of nails 1-5  bilaterally performed with a nail nipper.  Filed with dremel without incident.  ° ° °Return office visit  4 months                   Told patient to return for periodic foot care and evaluation due to potential at risk complications.  ° ° °Wadie Liew DPM  °

## 2020-12-14 ENCOUNTER — Other Ambulatory Visit: Payer: Self-pay | Admitting: Nurse Practitioner

## 2020-12-14 DIAGNOSIS — E1169 Type 2 diabetes mellitus with other specified complication: Secondary | ICD-10-CM

## 2020-12-14 DIAGNOSIS — I5032 Chronic diastolic (congestive) heart failure: Secondary | ICD-10-CM

## 2020-12-15 ENCOUNTER — Telehealth: Payer: Self-pay

## 2020-12-15 NOTE — Telephone Encounter (Signed)
Patient left message on clinical intake voicemail stating that the reason she has not been using her inhaler is because she can't mash it. I called patient and she is still having the shortness of breath, is there anything else she is able to use? Please advise.  Message routed to [name], NP

## 2020-12-15 NOTE — Telephone Encounter (Signed)
We would need to reevaluate her in office. It looks like [name] treated her for an acute infection last month. Please have her make another appt.

## 2020-12-16 ENCOUNTER — Encounter: Payer: Self-pay | Admitting: Family

## 2020-12-17 ENCOUNTER — Encounter: Payer: Self-pay | Admitting: Family

## 2020-12-17 ENCOUNTER — Ambulatory Visit (INDEPENDENT_AMBULATORY_CARE_PROVIDER_SITE_OTHER): Payer: PPO | Admitting: Family

## 2020-12-17 ENCOUNTER — Other Ambulatory Visit: Payer: Self-pay

## 2020-12-17 VITALS — BP 132/86 | HR 77 | Temp 97.1°F | Resp 18 | Ht 67.0 in | Wt 331.8 lb

## 2020-12-17 DIAGNOSIS — J45991 Cough variant asthma: Secondary | ICD-10-CM

## 2020-12-17 DIAGNOSIS — R0981 Nasal congestion: Secondary | ICD-10-CM | POA: Diagnosis not present

## 2020-12-17 DIAGNOSIS — I5032 Chronic diastolic (congestive) heart failure: Secondary | ICD-10-CM | POA: Diagnosis not present

## 2020-12-17 DIAGNOSIS — B372 Candidiasis of skin and nail: Secondary | ICD-10-CM

## 2020-12-17 DIAGNOSIS — Z66 Do not resuscitate: Secondary | ICD-10-CM

## 2020-12-17 MED ORDER — ALBUTEROL SULFATE (2.5 MG/3ML) 0.083% IN NEBU: 2.5000 mg | INHALATION_SOLUTION | Freq: Four times a day (QID) | RESPIRATORY_TRACT | 1 refills | Status: DC | PRN

## 2020-12-17 MED ORDER — FLUTICASONE PROPIONATE 50 MCG/ACT NA SUSP: 1.0000 | Freq: Two times a day (BID) | NASAL | 3 refills | Status: DC

## 2020-12-17 MED ORDER — NYSTATIN 100000 UNIT/GM EX CREA: 1.0000 "application " | TOPICAL_CREAM | Freq: Two times a day (BID) | CUTANEOUS | 0 refills | Status: DC

## 2020-12-17 NOTE — Patient Instructions (Addendum)
-   please get Nebulizer machine and supplies from Medical supply store.Use with Albuterol nebulizer solution every 6 hrs as needed

## 2020-12-17 NOTE — Progress Notes (Signed)
Provider: Roemello Speyer FNP-C  [name], NP  Patient Care Team: [name], NP as PCP - General (Geriatric Medicine) [name], MD (Inactive) as PCP - Cardiology (Cardiology) [name], [name], MD (Inactive) as Consulting Physician (Cardiology) [name], MD as Consulting Physician (Ophthalmology) [name], MD as Consulting Physician (General Surgery) [name], MD (Inactive) as Referring Physician (Obstetrics and Gynecology) [name] [name], MD as Consulting Physician (Ophthalmology) [facility] [place], [name], MD as Consulting Physician (Endocrinology) [name], MD as Consulting Physician (Hematology) [name], DO as Consulting Physician (Neurology)  Extended Emergency Contact Information Primary Emergency Contact: Gore,Pamela Address: [address]          [place], [place] [address] Johnnette Litter of [place] Phone: [phone] Mobile Phone: [phone] Relation: Daughter Secondary Emergency Contact: Lanny Cramp, [place] [address] Johnnet[place] Litter of [place] Phone: [phone] Mobile Phone: [phone] Relation: Daughter  Code Status:  DNR Goals of care: Advanced Directive information Advanced Directives 12/16/2020  Does Patient Have a Medical Advance Directive? Yes  Type of Advance Directive Out of facility DNR (pink MOST or yellow form)  Does patient want to make changes to medical advance directive? No - Patient declined  Copy of [place] in Chart? -  Would patient like information on creating a medical advance directive? -  Pre-existing out of facility DNR order (yellow form or pink MOST form) -     Chief Complaint  Patient presents with   Acute Visit    Reevaluate SOB. Patient is unable to use inhaler.     HPI:  Pt is a 86 y.o. female seen today for an acute visit for evaluation of shortness of breath.Has a significant medical history of Asthma.shortness of breath  worst when she exert herself.currently on albuterol inhaler every 6 hrs as needed and Symbicort.States unable to use inhaler due to numbness on her hands cannot press the inhaler.Granddaughter has been helping to press the inhaler but will be going back to [place] in [place] next week.  She denies any abrupt weight gain or edema. Also denies any fever,chills or URI symptoms.   Past Medical History:  Diagnosis Date   Allergy    takes Mucinex daily as needed   Anemia, unspecified    Anxiety    takes Clonazepam daily as needed   Arthritis    Breast cancer ([place])    Chronic diastolic CHF (congestive heart failure) (HCC)    takes Furosemide daily   CKD (chronic kidney disease), stage III ([place])    Coronary artery disease    a. s/p IMI 2004 tx with BMS to RCA;  b. s/p Promus DES to RCA 2/12 c. abnormal nuc 2016 -> cath with med rx. d. NSTEMI 02/2020  mv CAD with severe ISR in pRCA and m/dRCA lesion both treated with DES.   Dementia ([place])    Depression    takes Cymbalta daily   Diabetes mellitus    insulin daily   GERD (gastroesophageal reflux disease)    takes Protonix daily   Gout, unspecified    Headache    occasionally   History of blood clots    Hyperlipidemia    takes Pravastatin daily   Hypertension    Insomnia    Joint pain    Joint swelling    Morbid obesity ([place])    Myocardial infarction ([place]) 2004   Non-STEMI (non-ST elevated myocardial infarction) ([place]) 02/15/2020  Numbness    Obstructive sleep apnea    does not wear cpap   Osteoarthritis    Osteoarthritis    Osteoarthrosis, unspecified whether generalized or localized, lower leg    Pain, chronic    Polymyalgia rheumatica ([place])    Pulmonary emboli ([place]) 01/2012   felt to need lifelong anticoagulation but Xarelto dc in 02/2020 due to need for DAPT   Urinary incontinence    takes Linzess daily   Vertigo    hx of;was taking Meclizine if needed   Wears dentures    Past Surgical History:  Procedure Laterality  Date   ABDOMINAL HYSTERECTOMY     partial   APPENDECTOMY     blood clots/legs and lungs  2013   BREAST BIOPSY Left 07/22/2014   BREAST BIOPSY Left 02/10/2013   BREAST LUMPECTOMY Left 11/05/2014   BREAST LUMPECTOMY WITH RADIOACTIVE SEED LOCALIZATION Left 11/05/2014   Procedure: LEFT BREAST LUMPECTOMY WITH RADIOACTIVE SEED LOCALIZATION;  Surgeon: [name], MD;  Location: [facility];  Service: General;  Laterality: Left;   CARDIAC CATHETERIZATION     COLONOSCOPY     CORONARY ANGIOPLASTY  2   CORONARY STENT INTERVENTION N/A 02/17/2020   Procedure: CORONARY STENT INTERVENTION;  Surgeon: Leonie Man, MD;  Location: [facility];  Service: Cardiovascular;  Laterality: N/A;   ESOPHAGOGASTRODUODENOSCOPY (EGD) WITH PROPOFOL N/A 11/07/2016   Procedure: ESOPHAGOGASTRODUODENOSCOPY (EGD) WITH PROPOFOL;  Surgeon: Gatha Mayer, MD;  Location: [facility];  Service: Endoscopy;  Laterality: N/A;   EXCISION OF SKIN TAG Right 11/05/2014   Procedure: EXCISION OF RIGHT EYELID SKIN TAG;  Surgeon: [name], MD;  Location: [facility];  Service: General;  Laterality: Right;   EYE SURGERY Bilateral    cataract    GASTRIC BYPASS  1977    reversed in 1979, [place] CATH AND CORONARY ANGIOGRAPHY N/A 02/17/2020   Procedure: LEFT HEART CATH AND CORONARY ANGIOGRAPHY;  Surgeon: Leonie Man, MD;  Location: [facility];  Service: Cardiovascular;  Laterality: N/A;   LEFT HEART CATHETERIZATION WITH CORONARY ANGIOGRAM N/A 06/29/2014   Procedure: LEFT HEART CATHETERIZATION WITH CORONARY ANGIOGRAM;  Surgeon: Troy Sine, MD;  Location: [facility] C[facility];  Service: Cardiovascular;  Laterality: N/A;   MEMBRANE PEEL Right 10/23/2018   Procedure: MEMBRANE PEEL;  Surgeon: Hurman Horn, MD;  Location: [facility];  Service: Ophthalmology;  Laterality: Right;   MI with stent placement  2004   PARS PLANA VITRECTOMY Right 10/23/2018   Procedure: PARS PLANA VITRECTOMY WITH 25 GAUGE;  Surgeon:  Hurman Horn, MD;  Location: [facility];  Service: Ophthalmology;  Laterality: Right;    Allergies  Allergen Reactions   Sulfonamide Derivatives Swelling    Mouth swelling   Aricept [Donepezil Hcl] Diarrhea and Nausea And Vomiting    GI upset/loose stools   Tramadol Nausea And Vomiting    Outpatient Encounter Medications as of 12/17/2020  Medication Sig   albuterol (PROAIR HFA) 108 (90 Base) MCG/ACT inhaler Inhale 1-2 puffs into the lungs every 6 (six) hours as needed for wheezing or shortness of breath.   anastrozole (ARIMIDEX) 1 MG tablet TAKE 1 TABLET BY MOUTH EVERY DAY   atorvastatin (LIPITOR) 80 MG tablet Take 1 tablet (80 mg total) by mouth daily.   budesonide-formoterol (SYMBICORT) 160-4.5 MCG/ACT inhaler Inhale 2 puffs into the lungs 2 (two) times daily. For bronchitis   cetirizine (ZYRTEC) 10 MG tablet Take 1 tablet (10 mg total) by mouth daily.  clopidogrel (PLAVIX) 75 MG tablet Take 1 tablet (75 mg total) by mouth daily.   Continuous Blood Gluc Sensor (FREESTYLE LIBRE 14 DAY SENSOR) MISC Inject 1 Device into the skin as directed.   Cyanocobalamin (VITAMIN B-12 PO) Take 1 tablet by mouth once a week.    docusate sodium (COLACE) 100 MG capsule Take 1-2 capsules (100-200 mg total) by mouth at bedtime as needed for moderate constipation.   Dulaglutide (TRULICITY) 1.5 YQ/0.3KV SOPN Inject 1.5 mg into the skin once a week.   DULoxetine (CYMBALTA) 60 MG capsule TAKE 1 CAPSULE BY MOUTH EVERY DAY FOR ANXIETY   febuxostat (ULORIC) 40 MG tablet TAKE 1 TABLET BY MOUTH EVERY DAY   fluticasone (FLONASE) 50 MCG/ACT nasal spray Place 1 spray into both nostrils in the morning and at bedtime.   furosemide (LASIX) 20 MG tablet TAKE 1 TABLET BY MOUTH EVERY DAY AS NEEDED (APPOINTMENT OVERDUE).]   GOODSENSE ASPIRIN LOW DOSE 81 MG EC tablet TAKE 1 TABLET BY MOUTH EVERY DAY SWALLOW WHOLE   hydrALAZINE (APRESOLINE) 50 MG tablet TAKE 1 TABLET BY MOUTH 2 TIMES DAILY   hydrOXYzine (ATARAX/VISTARIL) 10 MG  tablet Take 1 tablet by mouth three times daily as needed   insulin glargine, 2 Unit Dial, (TOUJEO MAX SOLOSTAR) 300 UNIT/ML Solostar Pen Inject 42 Units into the skin daily. Patient assistance program provides   isosorbide mononitrate (IMDUR) 60 MG 24 hr tablet TAKE 1 TABLET BY MOUTH EVERY DAY   meclizine (ANTIVERT) 25 MG tablet TAKE 1 TABLET BY MOUTH THREE TIMES DAILY AS NEEDED FOR DIZZINESS   memantine (NAMENDA) 5 MG tablet Take 2 by mouth in the am and 1 by mouth in the pm   metoprolol succinate (TOPROL-XL) 50 MG 24 hr tablet Take 50 mg by mouth daily.   nitroGLYCERIN (NITROSTAT) 0.4 MG SL tablet Take one tablet under the tongue every 5 minutes as needed for chest pain   NOVOLOG FLEXPEN RELION 100 UNIT/ML FlexPen INJECT 16 UNITS SUBCUTANEOUSLY THREE TIMES DAILY WITH MEALS. MAX DAILY DOSE OF 70 UNITS.   oxyCODONE-acetaminophen (PERCOCET) 5-325 MG tablet Take 1 tablet by mouth every 12 (twelve) hours as needed for severe pain. Must last 30 days.   pantoprazole (PROTONIX) 40 MG tablet TAKE 1 TABLET BY MOUTH EVERY DAY   pregabalin (LYRICA) 25 MG capsule Take 1 capsule (25 mg total) by mouth 2 (two) times daily.   saccharomyces boulardii (FLORASTOR) 250 MG capsule Take 1 capsule (250 mg total) by mouth 2 (two) times daily.   sodium polystyrene (SPS) 15 GM/60ML suspension TAKE 61mls BY MOUTH ONCE A WEEK TO keep potassium down   topiramate (TOPAMAX) 25 MG tablet TAKE 1 TABLET BY MOUTH EVERY DAY   Vitamin D, Ergocalciferol, (DRISDOL) 1.25 MG (50000 UNIT) CAPS capsule TAKE 1 CAPSULE BY MOUTH ONCE WEEKLY   No facility-administered encounter medications on file as of 12/17/2020.    Review of Systems  Constitutional:  Negative for appetite change, chills, fatigue, fever and unexpected weight change.  HENT:  Positive for hearing loss. Negative for congestion, dental problem, ear discharge, ear pain, facial swelling, nosebleeds, postnasal drip, rhinorrhea, sinus pressure, sinus pain, sneezing, sore throat,  tinnitus and trouble swallowing.   Eyes:  Negative for pain, discharge, redness and itching.  Respiratory:  Negative for cough, chest tightness and wheezing.        Wheezing and shortness of breath   Cardiovascular:  Negative for chest pain, palpitations and leg swelling.  Gastrointestinal:  Negative for abdominal distention, abdominal pain,  blood in stool, constipation, diarrhea, nausea and vomiting.  Musculoskeletal:  Positive for gait problem. Negative for arthralgias, back pain, joint swelling, myalgias, neck pain and neck stiffness.  Skin:  Negative for color change, pallor, rash and wound.       Skin irritation on folds on arm and abdomen   Neurological:  Positive for numbness. Negative for dizziness, syncope, speech difficulty, weakness, light-headedness and headaches.       Chronic numbness on hands and feet   Hematological:  Does not bruise/bleed easily.  Psychiatric/Behavioral:  Negative for agitation, behavioral problems, confusion, hallucinations, self-injury, sleep disturbance and suicidal ideas. The patient is not nervous/anxious.    Immunization History  Administered Date(s) Administered   Fluad Quad(high Dose 65+) 02/12/2019, 02/18/2020   Influenza Whole 03/14/2007, 02/13/2008, 01/18/2010   Influenza, High Dose Seasonal PF 01/16/2017, 03/11/2018   Influenza,inj,Quad PF,6+ Mos 01/08/2013, 02/18/2014, 01/18/2015, 01/24/2016   Influenza-Unspecified 02/15/2012, 02/18/2014   PFIZER(Purple Top)SARS-COV-2 Vaccination 07/06/2019, 08/05/2019, 12/30/2019   Pneumococcal Conjugate-13 02/27/2017   Pneumococcal Polysaccharide-23 05/08/2002   Td 05/08/2008   Pertinent  Health Maintenance Due  Topic Date Due   OPHTHALMOLOGY EXAM  08/26/2020   INFLUENZA VACCINE  12/06/2020   MAMMOGRAM  02/19/2021   FOOT EXAM  02/25/2021   HEMOGLOBIN A1C  04/01/2021   DEXA SCAN  Completed   PNA vac Low Risk Adult  Completed   Fall Risk  12/16/2020 11/16/2020 10/12/2020 07/20/2020 06/03/2020  Falls in the  past year? 0 0 1 0 0  Number falls in past yr: 0 0 (No Data) 0 -  Comment - - 1 month ago - -  Injury with Fall? 0 0 1 0 -  Comment - - bruise on right side - -  Risk Factor Category  - - - - -  Risk for fall due to : No Fall Risks No Fall Risks - - -  Follow up Falls evaluation completed Falls evaluation completed - - -   Functional Status Survey:    Vitals:   12/17/20 1325  BP: 132/86  Pulse: 77  Resp: 18  Temp: (!) 97.1 F (36.2 C)  TempSrc: Temporal  SpO2: 94%  Weight: (!) 331 lb 12.8 oz (150.5 kg)  Height: 5\' 7"  (1.702 m)   Body mass index is 51.97 kg/m. Physical Exam Vitals reviewed.  Constitutional:      General: She is not in acute distress.    Appearance: Normal appearance. She is morbidly obese. She is not ill-appearing or diaphoretic.  Eyes:     General: No scleral icterus.       Right eye: No discharge.        Left eye: No discharge.     Conjunctiva/sclera: Conjunctivae normal.     Pupils: Pupils are equal, round, and reactive to light.  Cardiovascular:     Rate and Rhythm: Normal rate and regular rhythm.     Pulses: Normal pulses.     Heart sounds: Normal heart sounds. No murmur heard.   No friction rub. No gallop.  Pulmonary:     Effort: Pulmonary effort is normal. No respiratory distress.     Breath sounds: Wheezing present. No rhonchi or rales.  Chest:     Chest wall: No tenderness.  Abdominal:     General: Bowel sounds are normal. There is no distension.     Palpations: Abdomen is soft. There is no mass.     Tenderness: There is no abdominal tenderness. There is no right CVA tenderness, left CVA tenderness,  guarding or rebound.  Musculoskeletal:        General: No swelling or tenderness. Normal range of motion.     Right lower leg: No edema.     Left lower leg: No edema.  Skin:    General: Skin is warm and dry.     Coloration: Skin is not pale.     Findings: No erythema.     Comments: Abdominal fold skin shiny sliver color with irritation    Neurological:     Mental Status: She is alert and oriented to person, place, and time.     Motor: No weakness.     Gait: Gait abnormal.  Psychiatric:        Mood and Affect: Mood normal.        Speech: Speech normal.        Behavior: Behavior normal.        Thought Content: Thought content normal.    Labs reviewed: Recent Labs    03/02/20 1517 03/03/20 1237 09/07/20 1345  NA 141 138 140  K 4.4 4.0 4.1  CL 103 104 107  CO2 26 23 21*  GLUCOSE 277* 254* 302*  BUN 29* 32* 36*  CREATININE 1.47* 1.98* 1.65*  CALCIUM 8.6* 8.6* 8.4*   Recent Labs    12/23/19 1021 02/23/20 1408 09/07/20 1345  AST 9* 10* 12*  ALT 7 6 6   ALKPHOS  --  84 89  BILITOT 0.3 <0.2* 0.4  PROT 6.9 7.9 7.2  ALBUMIN  --  2.9* 2.8*   Recent Labs    12/23/19 1021 02/14/20 1624 02/23/20 1408 03/02/20 1517 03/03/20 1237 09/07/20 1345  WBC 14.1*   < > 15.8* 11.8* 11.3* 12.6*  NEUTROABS 9,828*  --  11.6*  --   --  8.9*  HGB 9.0*   < > 9.5* 9.3* 9.3* 8.5*  HCT 28.9*   < > 31.7* 29.5* 32.5* 28.6*  MCV 87.8   < > 89.8 88 92.9 89.9  PLT 279   < > 278 254 283 256   < > = values in this interval not displayed.   Lab Results  Component Value Date   TSH 2.370 03/02/2020   Lab Results  Component Value Date   HGBA1C 8.0 (A) 09/29/2020   Lab Results  Component Value Date   CHOL 117 02/15/2020   HDL 40 (L) 02/15/2020   LDLCALC 60 02/15/2020   TRIG 85 02/15/2020   CHOLHDL 2.9 02/15/2020    Significant Diagnostic Results in last 30 days:  No results found.  Assessment/Plan  1. Sinus congestion Afebrile. Continue on Flonase  - fluticasone (FLONASE) 50 MCG/ACT nasal spray; Place 1 spray into both nostrils in the morning and at bedtime.  Dispense: 11.1 mL; Refill: 3  2. Cough variant asthma Unable to press inhaler due to numbness/tingling on fingers. Will change Albuterol to nebulizer solution.  - albuterol (PROVENTIL) (2.5 MG/3ML) 0.083% nebulizer solution; Take 3 mLs (2.5 mg total) by  nebulization every 6 (six) hours as needed for wheezing or shortness of breath.  Dispense: 150 mL; Refill: 1 - For home use only DME Other see comment: Nebulizer with supplies order written script given to take to medical supply.   3. Candidiasis of skin Abdominal skin fold  - nystatin cream (MYCOSTATIN); Apply 1 application topically 2 (two) times daily.  Dispense: 30 g; Refill: 0  4. Chronic diastolic congestive heart failure (HCC) No signs of fluid overload.weight stable. Continue on Furosemide   5. DNR (do not  resuscitate) On Vynca.updated on Epic chart.  - Do not attempt resuscitation (DNR)  Family/ staff Communication: Reviewed plan of care with patient and Granddaughter verbalized understanding.  Labs/tests ordered: None   Next Appointment: Has upcoming appointment with PCP [place],NP 01/21/2021  Sandrea Hughs, NP

## 2020-12-20 ENCOUNTER — Telehealth: Payer: Self-pay

## 2020-12-20 NOTE — Telephone Encounter (Signed)
Patient needs order for nebulizer sent to [facility] in order to process through insurance.  Routed to Marlowe Sax, NP

## 2020-12-20 NOTE — Telephone Encounter (Signed)
Order faxed to [employer] and patient notified

## 2020-12-20 NOTE — Telephone Encounter (Signed)
Order reprinted please send to [facility] per patient's request.

## 2020-12-21 ENCOUNTER — Ambulatory Visit
Payer: PPO | Attending: Student in an Organized Health Care Education/Training Program | Admitting: Student in an Organized Health Care Education/Training Program

## 2020-12-21 ENCOUNTER — Other Ambulatory Visit: Payer: Self-pay

## 2020-12-21 ENCOUNTER — Encounter: Payer: Self-pay | Admitting: Student in an Organized Health Care Education/Training Program

## 2020-12-21 VITALS — BP 155/72 | HR 73 | Temp 96.6°F | Resp 16 | Ht 67.0 in | Wt 331.0 lb

## 2020-12-21 DIAGNOSIS — M48062 Spinal stenosis, lumbar region with neurogenic claudication: Secondary | ICD-10-CM | POA: Diagnosis not present

## 2020-12-21 DIAGNOSIS — G8929 Other chronic pain: Secondary | ICD-10-CM | POA: Diagnosis not present

## 2020-12-21 DIAGNOSIS — M5416 Radiculopathy, lumbar region: Secondary | ICD-10-CM | POA: Insufficient documentation

## 2020-12-21 DIAGNOSIS — M25521 Pain in right elbow: Secondary | ICD-10-CM | POA: Diagnosis not present

## 2020-12-21 DIAGNOSIS — M47816 Spondylosis without myelopathy or radiculopathy, lumbar region: Secondary | ICD-10-CM | POA: Insufficient documentation

## 2020-12-21 MED ORDER — OXYCODONE-ACETAMINOPHEN 5-325 MG PO TABS: 1.0000 | ORAL_TABLET | Freq: Two times a day (BID) | ORAL | 0 refills | Status: AC | PRN

## 2020-12-21 NOTE — Progress Notes (Signed)
Nursing Pain Medication Assessment:  Safety precautions to be maintained throughout the outpatient stay will include: orient to surroundings, keep bed in low position, maintain call bell within reach at all times, provide assistance with transfer out of bed and ambulation.  Medication Inspection Compliance: Pill count conducted under aseptic conditions, in front of the patient. Neither the pills nor the bottle was removed from the patient's sight at any time. Once count was completed pills were immediately returned to the patient in their original bottle.  Medication: Oxycodone/APAP Pill/Patch Count:  37 of 60 pills remain Pill/Patch Appearance: Markings consistent with prescribed medication Bottle Appearance: Standard pharmacy container. Clearly labeled. Filled Date: 08 / 03 / 2022 Last Medication intake:  Today

## 2020-12-21 NOTE — Progress Notes (Signed)
PROVIDER NOTE: Information contained herein reflects review and annotations entered in association with encounter. Interpretation of such information and data should be left to medically-trained personnel. Information provided to patient can be located elsewhere in the medical record under "Patient Instructions". Document created using STT-dictation technology, any transcriptional errors that may result from process are unintentional.    Patient: [name]  Service Category: E/M  Provider: Gillis Santa, MD  DOB: June 28, 1934  DOS: 12/21/2020  Specialty: Interventional Pain Management  MRN: [id number]  Setting: Ambulatory outpatient  PCP: [name], NP  Type: Established Patient    Referring Provider: [name], NP  Location: Office  Delivery: Face-to-face     HPI  [name], a 86 y.o. year old female, is here today because of her Chronic radicular lumbar pain [M54.16, G89.29]. [name] primary complain today is Pain (LEFT sided body pain; hands and feet are numb) Last encounter: My last encounter with her was on 10/12/2020. Pertinent problems: [name] has Severe episode of recurrent major depressive disorder, without psychotic features ([place]); Chronic radicular lumbar pain; Lumbar facet arthropathy; Spinal stenosis, lumbar region, with neurogenic claudication; Bilateral primary osteoarthritis of knee; and Pain management contract signed on their pertinent problem list. Pain Assessment: Severity of Chronic pain is reported as a 8 /10. Location: Other (Comment) (left side of body; sometimes right shoulder; hands and feet are numb) Left/ . Onset:  . Quality: Aching. Timing:  . Modifying factor(s): meds. Vitals:  height is '5\' 7"'  (1.702 m) and weight is 331 lb (150.1 kg) (abnormal). Her temporal temperature is 96.6 F (35.9 C) (abnormal). Her blood pressure is 155/72 (abnormal) and her pulse is 73. Her respiration is 16 and oxygen saturation is 100%.   Reason for encounter:  medication management.    No change in medical history since last visit.  Patient's pain is at baseline.  Patient continues multimodal pain regimen as prescribed.  States that it provides pain relief and improvement in functional status. Is utilizing Colace which she states is helpful  Pharmacotherapy Assessment   12/08/2020  10/12/2020   1  Oxycodone-Acetaminophen 5-325 60.00  30  [place]  [id number]  [facility] ([id number])  0/0  15.00 MME  Medicare  [place]     Analgesic: Percocet 5 mg twice daily as needed, quantity 60/month  Monitoring: [place] PMP: PDMP reviewed during this encounter.       Pharmacotherapy: No side-effects or adverse reactions reported. Compliance: No problems identified. Effectiveness: Clinically acceptable.  Rise Patience, RN  12/21/2020  1:11 PM  Sign when Signing Visit Nursing Pain Medication Assessment:  Safety precautions to be maintained throughout the outpatient stay will include: orient to surroundings, keep bed in low position, maintain call bell within reach at all times, provide assistance with transfer out of bed and ambulation.  Medication Inspection Compliance: Pill count conducted under aseptic conditions, in front of the patient. Neither the pills nor the bottle was removed from the patient's sight at any time. Once count was completed pills were immediately returned to the patient in their original bottle.  Medication: Oxycodone/APAP Pill/Patch Count:  37 of 60 pills remain Pill/Patch Appearance: Markings consistent with prescribed medication Bottle Appearance: Standard pharmacy container. Clearly labeled. Filled Date: 08 / 03 / 2022 Last Medication intake:  Today      UDS: : Final result   Visible to patient: Yes (not seen)   Next appt: 10/26/2020 at 01:45 PM in Podiatry [name], DPM)   Dx: Chronic  pain syndrome   0 Result Notes   Ref Range & Units 6 mo ago  Amphetamines, IA Cutoff:50 ng/mL Negative   Barbiturates, IA Cutoff:0.1 ug/mL Negative    Benzodiazepines, IA Cutoff:20 ng/mL Negative   Cocaine & Metabolite, IA Cutoff:25 ng/mL Negative   Phencyclidine, IA Cutoff:8 ng/mL Negative   THC(Marijuana) Metabolite, IA Cutoff:5 ng/mL Negative   Opiates, IA Cutoff:5 ng/mL Negative   Oxycodones, IA Cutoff:5 ng/mL Negative   Comment: Presumptive immunoassay result indicated need for further  testing; definitive confirmation was negative.   Methadone, IA Cutoff:25 ng/mL Negative   Propoxyphene, IA Cutoff:50 ng/mL Negative   Comment: This test was developed and its performance characteristics  determined by [facility].  It has not been cleared or approved  by the Food and Drug Administration.         ROS  Constitutional: Denies any fever or chills Gastrointestinal: No reported hemesis, hematochezia, vomiting, or acute GI distress Musculoskeletal:  left shoulder pain Neurological: No reported episodes of acute onset apraxia, aphasia, dysarthria, agnosia, amnesia, paralysis, loss of coordination, or loss of consciousness  Medication Review  Cyanocobalamin, DULoxetine, Dulaglutide, FreeStyle Libre 14 Day Sensor, Vitamin D (Ergocalciferol), albuterol, anastrozole, aspirin, atorvastatin, budesonide-formoterol, cetirizine, clopidogrel, docusate sodium, febuxostat, fluticasone, furosemide, hydrALAZINE, hydrOXYzine, insulin aspart, insulin glargine (2 Unit Dial), isosorbide mononitrate, meclizine, memantine, metoprolol succinate, nitroGLYCERIN, nystatin cream, oxyCODONE-acetaminophen, pantoprazole, pregabalin, saccharomyces boulardii, sodium polystyrene, and topiramate  History Review  Allergy: [name] is allergic to sulfonamide derivatives, aricept [donepezil hcl], and tramadol. Drug: [name]  reports no history of drug use. Alcohol:  reports no history of alcohol use. Tobacco:  reports that she has never smoked. She has never used smokeless tobacco. Social: [name]  reports that she has never smoked. She has never used smokeless  tobacco. She reports that she does not drink alcohol and does not use drugs. Medical:  has a past medical history of Allergy, Anemia, unspecified, Anxiety, Arthritis, Breast cancer ([place]), Chronic diastolic CHF (congestive heart failure) ([place]), CKD (chronic kidney disease), stage III ([place]), Coronary artery disease, Dementia ([place]), Depression, Diabetes mellitus, GERD (gastroesophageal reflux disease), Gout, unspecified, Headache, History of blood clots, Hyperlipidemia, Hypertension, Insomnia, Joint pain, Joint swelling, Morbid obesity ([place]), Myocardial infarction ([place]) (2004), Non-STEMI (non-ST elevated myocardial infarction) ([place]) (02/15/2020), Numbness, Obstructive sleep apnea, Osteoarthritis, Osteoarthritis, Osteoarthrosis, unspecified whether generalized or localized, lower leg, Pain, chronic, Polymyalgia rheumatica ([place]), Pulmonary emboli ([place]) (01/2012), Urinary incontinence, Vertigo, and Wears dentures. Surgical: Ms. Langland  has a past surgical history that includes Appendectomy; Cardiac catheterization; Gastric bypass (1977 ); blood clots/legs and lungs (2013); MI with stent placement (2004); left heart catheterization with coronary angiogram (N/A, 06/29/2014); Coronary angioplasty (2); Eye surgery (Bilateral); Colonoscopy; Abdominal hysterectomy; Breast lumpectomy with radioactive seed localization (Left, 11/05/2014); Excision of skin tag (Right, 11/05/2014); Breast lumpectomy (Left, 11/05/2014); Breast biopsy (Left, 07/22/2014); Breast biopsy (Left, 02/10/2013); Esophagogastroduodenoscopy (egd) with propofol (N/A, 11/07/2016); Pars plana vitrectomy (Right, 10/23/2018); Membrane peel (Right, 10/23/2018); LEFT HEART CATH AND CORONARY ANGIOGRAPHY (N/A, 02/17/2020); and CORONARY STENT INTERVENTION (N/A, 02/17/2020). Family: family history includes Arthritis in her father and sister; Breast cancer (age of onset: 17) in her mother; Diabetes in her father and sister; Heart disease in her cousin and mother; Hypertension  in her father; Obesity in her sister; Throat cancer in her father.  Laboratory Chemistry Profile   Renal Lab Results  Component Value Date   BUN 36 (H) 09/07/2020   CREATININE 1.65 (H) 09/07/2020   LABCREA 36 06/14/2017   BCR 20 03/02/2020  GFR 49.91 (L) 07/31/2012   GFRAA 38 (L) 03/02/2020   GFRNONAA 31 (L) 09/07/2020     Hepatic Lab Results  Component Value Date   AST 12 (L) 09/07/2020   ALT 6 09/07/2020   ALBUMIN 2.8 (L) 09/07/2020   ALKPHOS 89 09/07/2020   LIPASE 39 08/27/2018     Electrolytes Lab Results  Component Value Date   NA 140 09/07/2020   K 4.1 09/07/2020   CL 107 09/07/2020   CALCIUM 8.4 (L) 09/07/2020   MG 1.4 (L) 10/24/2017   PHOS 3.7 10/09/2017     Bone Lab Results  Component Value Date   VD25OH 21.76 (L) 07/24/2019     Inflammation (CRP: Acute Phase) (ESR: Chronic Phase) Lab Results  Component Value Date   CRP 1.0 (H) 01/30/2019   ESRSEDRATE 110 (H) 09/25/2014   LATICACIDVEN 1.5 01/25/2019       Note: Above Lab results reviewed.  Recent Imaging Review  DG ELBOW COMPLETE RIGHT (3+VIEW) CLINICAL DATA:  Elbow swelling  EXAM: RIGHT ELBOW - COMPLETE 3+ VIEW  COMPARISON:  None.  FINDINGS: Mild degenerative changes with early spurring in joint space narrowing. No acute bony abnormality. Specifically, no fracture, subluxation, or dislocation. No joint effusion.  IMPRESSION: No acute bony abnormality.  Electronically Signed   By: Rolm Baptise M.D.   On: 06/03/2020 18:43 Note: Reviewed        Physical Exam  General appearance: Well nourished, well developed, and well hydrated. In no apparent acute distress Mental status: Alert, oriented x 3 (person, place, & time)       Respiratory: No evidence of acute respiratory distress Eyes: PERLA Vitals: BP (!) 155/72 (BP Location: Right Wrist, Patient Position: Sitting, Cuff Size: Normal)   Pulse 73   Temp (!) 96.6 F (35.9 C) (Temporal)   Resp 16   Ht '5\' 7"'  (1.702 m)   Wt (!) 331 lb  (150.1 kg)   SpO2 100%   BMI 51.84 kg/m  BMI: Estimated body mass index is 51.84 kg/m as calculated from the following:   Height as of this encounter: '5\' 7"'  (1.702 m).   Weight as of this encounter: 331 lb (150.1 kg). Ideal: Ideal body weight: 61.6 kg (135 lb 12.9 oz) Adjusted ideal body weight: 97 kg (213 lb 14.1 oz)  Thoracic Spine Area Exam  Skin & Axial Inspection: No masses, redness, or swelling Alignment: Symmetrical Functional ROM: Pain restricted ROM Stability: No instability detected Muscle Tone/Strength: Functionally intact. No obvious neuro-muscular anomalies detected. Sensory (Neurological): Unimpaired Muscle strength & Tone: No palpable anomalies  Bilateral shoulder pain   Lumbar Exam  Skin & Axial Inspection: No masses, redness, or swelling Alignment: Symmetrical Functional ROM: Pain restricted ROM       Stability: No instability detected Muscle Tone/Strength: Functionally intact. No obvious neuro-muscular anomalies detected. Sensory (Neurological): Musculoskeletal pain pattern Palpation: No palpable anomalies       Provocative Tests: Hyperextension/rotation test: Unable to perform       Lumbar quadrant test (Kemp's test): Unable to perform       Lateral bending test: Unable to perform       Patrick's Maneuver: Unable to perform                     Ambulation: Patient came in today in a wheel chair Gait: Modified gait pattern (slower gait speed, wider stride width, and longer stance duration) associated with morbid obesity Posture: Difficulty standing up straight, due to pain  Lower Extremity Exam      Side: Right lower extremity   Side: Left lower extremity  Stability: No instability observed           Stability: No instability observed          Skin & Extremity Inspection: Skin color, temperature, and hair growth are WNL. No peripheral edema or cyanosis. No masses, redness, swelling, asymmetry, or associated skin lesions. No contractures.   Skin &  Extremity Inspection: Skin color, temperature, and hair growth are WNL. No peripheral edema or cyanosis. No masses, redness, swelling, asymmetry, or associated skin lesions. No contractures.  Functional ROM: Pain restricted ROM for all joints of the lower extremity           Functional ROM: Pain restricted ROM for all joints of the lower extremity          Muscle Tone/Strength: Functionally intact. No obvious neuro-muscular anomalies detected.   Muscle Tone/Strength: Functionally intact. No obvious neuro-muscular anomalies detected.  Sensory (Neurological): Neurogenic pain pattern         Sensory (Neurological): Neurogenic pain pattern        DTR: Patellar: deferred today Achilles: deferred today Plantar: deferred today   DTR: Patellar: deferred today Achilles: deferred today Plantar: deferred today  Palpation: No palpable anomalies   Palpation: No palpable anomalies     Assessment   Status Diagnosis  Persistent Persistent Persistent 1. Chronic radicular lumbar pain   2. Lumbar facet arthropathy   3. Spinal stenosis, lumbar region, with neurogenic claudication   4. Right elbow pain   5. Lumbar spondylosis       Plan of Care  [name] has a current medication list which includes the following long-term medication(s): albuterol, albuterol, atorvastatin, budesonide-formoterol, cetirizine, duloxetine, febuxostat, fluticasone, furosemide, hydralazine, toujeo max solostar, isosorbide mononitrate, memantine, novolog flexpen, pantoprazole, pregabalin, and topiramate.  Pharmacotherapy (Medications Ordered): Meds ordered this encounter  Medications   oxyCODONE-acetaminophen (PERCOCET) 5-325 MG tablet    Sig: Take 1 tablet by mouth every 12 (twelve) hours as needed for severe pain. Must last 30 days.    Dispense:  60 tablet    Refill:  0    Chronic Pain: STOP Act (Not applicable) Fill 1 day early if closed on refill date. Avoid benzodiazepines within 8 hours of opioids    oxyCODONE-acetaminophen (PERCOCET) 5-325 MG tablet    Sig: Take 1 tablet by mouth every 12 (twelve) hours as needed for severe pain. Must last 30 days.    Dispense:  60 tablet    Refill:  0    Chronic Pain: STOP Act (Not applicable) Fill 1 day early if closed on refill date. Avoid benzodiazepines within 8 hours of opioids   oxyCODONE-acetaminophen (PERCOCET) 5-325 MG tablet    Sig: Take 1 tablet by mouth every 12 (twelve) hours as needed for severe pain. Must last 30 days.    Dispense:  60 tablet    Refill:  0    Chronic Pain: STOP Act (Not applicable) Fill 1 day early if closed on refill date. Avoid benzodiazepines within 8 hours of opioids   Continue Cymbalta as prescribed.  Follow-up plan:   Return in about 15 weeks (around 04/05/2021) for Medication Management, in person.      Interventional management options:  Considering:   Intra-articular knee Hyalgan injections can be done in wheelchair Patient is on Plavix and is morbidly obese and unable to get on fluoroscopy table for interventional therapy.   PRN Procedures:  None at this time      Recent Visits Date Type Provider Dept  10/12/20 Office Visit Gillis Santa, MD [facility]  Showing recent visits within past 90 days and meeting all other requirements Today's Visits Date Type Provider Dept  12/21/20 Office Visit Gillis Santa, MD [facility]  Showing today's visits and meeting all other requirements Future Appointments No visits were found meeting these conditions. Showing future appointments within next 90 days and meeting all other requirements I discussed the assessment and treatment plan with the patient. The patient was provided an opportunity to ask questions and all were answered. The patient agreed with the plan and demonstrated an understanding of the instructions.  Patient advised to call back or seek an in-person evaluation if the symptoms or condition worsens.  Duration of encounter:  30 minutes.  Note by: Gillis Santa, MD Date: 12/21/2020; Time: 1:22 PM

## 2020-12-28 ENCOUNTER — Other Ambulatory Visit: Payer: Self-pay | Admitting: Hematology

## 2020-12-28 ENCOUNTER — Other Ambulatory Visit: Payer: Self-pay | Admitting: Nurse Practitioner

## 2020-12-28 DIAGNOSIS — J309 Allergic rhinitis, unspecified: Secondary | ICD-10-CM

## 2020-12-29 ENCOUNTER — Other Ambulatory Visit: Payer: Self-pay | Admitting: Nurse Practitioner

## 2020-12-29 DIAGNOSIS — J309 Allergic rhinitis, unspecified: Secondary | ICD-10-CM

## 2021-01-11 ENCOUNTER — Telehealth: Payer: Self-pay

## 2021-01-13 NOTE — Telephone Encounter (Signed)
Requests a refill of insulin, pt is in [place], [place] and family member forgot to pack insulin, at home. [facility] [address], hasn't had any insulin today. [phone]

## 2021-01-18 ENCOUNTER — Other Ambulatory Visit: Payer: PPO

## 2021-01-18 DIAGNOSIS — E1169 Type 2 diabetes mellitus with other specified complication: Secondary | ICD-10-CM

## 2021-01-18 DIAGNOSIS — I5032 Chronic diastolic (congestive) heart failure: Secondary | ICD-10-CM

## 2021-01-21 ENCOUNTER — Ambulatory Visit: Payer: PPO | Admitting: Nurse Practitioner

## 2021-01-24 ENCOUNTER — Other Ambulatory Visit: Payer: Self-pay | Admitting: Nurse Practitioner

## 2021-01-24 DIAGNOSIS — E875 Hyperkalemia: Secondary | ICD-10-CM

## 2021-01-24 NOTE — Telephone Encounter (Signed)
Pharmacy requested refill.  Pended and sent to Jessica for approval.  

## 2021-01-26 ENCOUNTER — Ambulatory Visit (INDEPENDENT_AMBULATORY_CARE_PROVIDER_SITE_OTHER): Payer: PPO | Admitting: Nurse Practitioner

## 2021-01-26 ENCOUNTER — Telehealth: Payer: Self-pay | Admitting: Nurse Practitioner

## 2021-01-26 ENCOUNTER — Other Ambulatory Visit: Payer: Self-pay

## 2021-01-26 ENCOUNTER — Encounter: Payer: Self-pay | Admitting: Nurse Practitioner

## 2021-01-26 VITALS — BP 132/82 | HR 77 | Temp 97.1°F | Ht 67.0 in | Wt 319.0 lb

## 2021-01-26 DIAGNOSIS — M1A9XX0 Chronic gout, unspecified, without tophus (tophi): Secondary | ICD-10-CM

## 2021-01-26 DIAGNOSIS — E785 Hyperlipidemia, unspecified: Secondary | ICD-10-CM

## 2021-01-26 DIAGNOSIS — M170 Bilateral primary osteoarthritis of knee: Secondary | ICD-10-CM | POA: Diagnosis not present

## 2021-01-26 DIAGNOSIS — E1140 Type 2 diabetes mellitus with diabetic neuropathy, unspecified: Secondary | ICD-10-CM

## 2021-01-26 DIAGNOSIS — Z794 Long term (current) use of insulin: Secondary | ICD-10-CM | POA: Diagnosis not present

## 2021-01-26 DIAGNOSIS — F322 Major depressive disorder, single episode, severe without psychotic features: Secondary | ICD-10-CM | POA: Diagnosis not present

## 2021-01-26 DIAGNOSIS — I25118 Atherosclerotic heart disease of native coronary artery with other forms of angina pectoris: Secondary | ICD-10-CM

## 2021-01-26 DIAGNOSIS — J449 Chronic obstructive pulmonary disease, unspecified: Secondary | ICD-10-CM | POA: Diagnosis not present

## 2021-01-26 DIAGNOSIS — R5381 Other malaise: Secondary | ICD-10-CM | POA: Diagnosis not present

## 2021-01-26 DIAGNOSIS — D649 Anemia, unspecified: Secondary | ICD-10-CM | POA: Diagnosis not present

## 2021-01-26 DIAGNOSIS — Z23 Encounter for immunization: Secondary | ICD-10-CM | POA: Diagnosis not present

## 2021-01-26 DIAGNOSIS — I2782 Chronic pulmonary embolism: Secondary | ICD-10-CM

## 2021-01-26 DIAGNOSIS — G894 Chronic pain syndrome: Secondary | ICD-10-CM | POA: Diagnosis not present

## 2021-01-26 DIAGNOSIS — G4700 Insomnia, unspecified: Secondary | ICD-10-CM | POA: Diagnosis not present

## 2021-01-26 MED ORDER — BUDESONIDE-FORMOTEROL FUMARATE 160-4.5 MCG/ACT IN AERO: 2.0000 | INHALATION_SPRAY | Freq: Two times a day (BID) | RESPIRATORY_TRACT | 3 refills | Status: DC

## 2021-01-26 NOTE — Progress Notes (Signed)
Careteam: Patient Care Team: [name], NP as PCP - General (Geriatric Medicine) [name], MD (Inactive) as PCP - Cardiology (Cardiology) [name], [name], MD (Inactive) as Consulting Physician (Cardiology) [name], MD as Consulting Physician (Ophthalmology) [name], MD as Consulting Physician (General Surgery) [name], MD (Inactive) as Referring Physician (Obstetrics and Gynecology) [name] [name], MD as Consulting Physician (Ophthalmology) [facility], [name], MD as Consulting Physician (Endocrinology) [name], MD as Consulting Physician (Hematology) [name], DO as Consulting Physician (Neurology)  PLACE OF SERVICE:  [place]  Advanced Directive information    Allergies  Allergen Reactions  . Sulfonamide Derivatives Swelling    Mouth swelling  . Aricept [Donepezil Hcl] Diarrhea and Nausea And Vomiting    GI upset/loose stools  . Tramadol Nausea And Vomiting    Chief Complaint  Patient presents with  . Acute Visit    Discuss getting order for a new wheelchair through [employer], current wheel chair is wore out. Here with daughter [name]. Not using trulicity due to cost, needs an alternative. Flu vaccine today.      HPI: Patient is a 86 y.o. female needing a new wheelchair.  Her currently wheelchair is worn out. The wheels can not be moved.  She needs a wheelchair due to her inability to walk due to her OA in bilateral knees. She also has neuropathy due to diabetes, chronic pain and weakness.  She requires help with her ADLs.   She is going to the pain clinic and they are helping her pain.  She does have constipation. Uses miralax with stool softener but does not use miralax daily   Reports she is having a lot of anxiety and pain.  Reports she takes Cymbalta. Reports she has ongoing issues  Reports she is taking someone else prescription tablet and needs it for herself. Reports it helps her with stress  and pain.   Review of Systems:  Review of Systems  Constitutional:  Positive for malaise/fatigue. Negative for chills, fever and weight loss.  HENT:  Negative for tinnitus.   Respiratory:  Positive for cough and shortness of breath. Negative for sputum production.   Cardiovascular:  Negative for chest pain, palpitations and leg swelling.  Gastrointestinal:  Positive for constipation. Negative for abdominal pain, diarrhea and heartburn.  Genitourinary:  Negative for dysuria, frequency and urgency.  Musculoskeletal:  Positive for back pain, joint pain, myalgias and neck pain. Negative for falls.  Skin: Negative.   Neurological:  Positive for tingling, weakness and headaches. Negative for dizziness.  Psychiatric/Behavioral:  Positive for depression. Negative for memory loss. The patient is nervous/anxious and has insomnia.    Past Medical History:  Diagnosis Date  . Allergy    takes Mucinex daily as needed  . Anemia, unspecified   . Anxiety    takes Clonazepam daily as needed  . Arthritis   . Breast cancer ([place])   . Chronic diastolic CHF (congestive heart failure) (HCC)    takes Furosemide daily  . CKD (chronic kidney disease), stage III ([place])   . Coronary artery disease    a. s/p IMI 2004 tx with BMS to RCA;  b. s/p Promus DES to RCA 2/12 c. abnormal nuc 2016 -> cath with med rx. d. NSTEMI 02/2020  mv CAD with severe ISR in pRCA and m/dRCA lesion both treated with DES.  . Dementia ([place])   . Depression    takes Cymbalta daily  . Diabetes mellitus  insulin daily  . GERD (gastroesophageal reflux disease)    takes Protonix daily  . Gout, unspecified   . Headache    occasionally  . History of blood clots   . Hyperlipidemia    takes Pravastatin daily  . Hypertension   . Insomnia   . Joint pain   . Joint swelling   . Morbid obesity ([place])   . Myocardial infarction ([place]) 2004  . Non-STEMI (non-ST elevated myocardial infarction) ([place]) 02/15/2020  . Numbness   . Obstructive  sleep apnea    does not wear cpap  . Osteoarthritis   . Osteoarthritis   . Osteoarthrosis, unspecified whether generalized or localized, lower leg   . Pain, chronic   . Polymyalgia rheumatica ([place])   . Pulmonary emboli ([place]) 01/2012   felt to need lifelong anticoagulation but Xarelto dc in 02/2020 due to need for DAPT  . Urinary incontinence    takes Linzess daily  . Vertigo    hx of;was taking Meclizine if needed  . Wears dentures    Past Surgical History:  Procedure Laterality Date  . ABDOMINAL HYSTERECTOMY     partial  . APPENDECTOMY    . blood clots/legs and lungs  2013  . BREAST BIOPSY Left 07/22/2014  . BREAST BIOPSY Left 02/10/2013  . BREAST LUMPECTOMY Left 11/05/2014  . BREAST LUMPECTOMY WITH RADIOACTIVE SEED LOCALIZATION Left 11/05/2014   Procedure: LEFT BREAST LUMPECTOMY WITH RADIOACTIVE SEED LOCALIZATION;  Surgeon: [name], MD;  Location: [place];  Service: General;  Laterality: Left;  . CARDIAC CATHETERIZATION    . COLONOSCOPY    . CORONARY ANGIOPLASTY  2  . CORONARY STENT INTERVENTION N/A 02/17/2020   Procedure: CORONARY STENT INTERVENTION;  Surgeon: Leonie Man, MD;  Location: [place] CV [facility];  Service: Cardiovascular;  Laterality: N/A;  . ESOPHAGOGASTRODUODENOSCOPY (EGD) WITH PROPOFOL N/A 11/07/2016   Procedure: ESOPHAGOGASTRODUODENOSCOPY (EGD) WITH PROPOFOL;  Surgeon: Gatha Mayer, MD;  Location: [facility];  Service: Endoscopy;  Laterality: N/A;  . EXCISION OF SKIN TAG Right 11/05/2014   Procedure: EXCISION OF RIGHT EYELID SKIN TAG;  Surgeon: [name], MD;  Location: [facility];  Service: General;  Laterality: Right;  . EYE SURGERY Bilateral    cataract   . GASTRIC BYPASS  1977    reversed in 1979, [place] CATH AND CORONARY ANGIOGRAPHY N/A 02/17/2020   Procedure: LEFT HEART CATH AND CORONARY ANGIOGRAPHY;  Surgeon: Leonie Man, MD;  Location: [place];  Service: Cardiovascular;  Laterality: N/A;  . LEFT  HEART CATHETERIZATION WITH CORONARY ANGIOGRAM N/A 06/29/2014   Procedure: LEFT HEART CATHETERIZATION WITH CORONARY ANGIOGRAM;  Surgeon: Troy Sine, MD;  [facility] C[facility];  Service: Cardiovascular;  Laterality: N/A;  . MEMBRANE PEEL Right 10/23/2018   Procedure: MEMBRANE PEEL;  Surgeon: Hurman Horn, MD;  Location: [place];  Service: Ophthalmology;  Laterality: Right;  . MI with stent placement  2004  . PARS PLANA VITRECTOMY Right 10/23/2018   Procedure: PARS PLANA VITRECTOMY WITH 25 GAUGE;  Surgeon: Hurman Horn, MD;  Location: [facility];  Service: Ophthalmology;  Laterality: Right;   Social History:   reports that she has never smoked. She has never used smokeless tobacco. She reports that she does not drink alcohol and does not use drugs.  Family History  Problem Relation Age of Onset  . Breast cancer Mother 48  . Heart disease Mother   . Throat cancer Father   . Hypertension Father   .  Arthritis Father   . Diabetes Father   . Arthritis Sister   . Obesity Sister   . Diabetes Sister   . Heart disease Cousin   . Colon cancer Neg Hx   . Stomach cancer Neg Hx   . Esophageal cancer Neg Hx     Medications: Patient's Medications  New Prescriptions   No medications on file  Previous Medications   ALBUTEROL (PROAIR HFA) 108 (90 BASE) MCG/ACT INHALER    Inhale 1-2 puffs into the lungs every 6 (six) hours as needed for wheezing or shortness of breath.   ALBUTEROL (PROVENTIL) (2.5 MG/3ML) 0.083% NEBULIZER SOLUTION    Take 3 mLs (2.5 mg total) by nebulization every 6 (six) hours as needed for wheezing or shortness of breath.   ANASTROZOLE (ARIMIDEX) 1 MG TABLET    TAKE 1 TABLET BY MOUTH EVERY DAY   ATORVASTATIN (LIPITOR) 80 MG TABLET    Take 1 tablet (80 mg total) by mouth daily.   BUDESONIDE-FORMOTEROL (SYMBICORT) 160-4.5 MCG/ACT INHALER    Inhale 2 puffs into the lungs 2 (two) times daily. For bronchitis   CETIRIZINE (ZYRTEC) 10 MG TABLET    TAKE 1 TABLET BY MOUTH EVERY DAY    CLOPIDOGREL (PLAVIX) 75 MG TABLET    Take 1 tablet (75 mg total) by mouth daily.   CONTINUOUS BLOOD GLUC SENSOR (FREESTYLE LIBRE 14 DAY SENSOR) MISC    Inject 1 Device into the skin as directed.   CYANOCOBALAMIN (VITAMIN B-12 PO)    Take 1 tablet by mouth once a week.    DULOXETINE (CYMBALTA) 60 MG CAPSULE    TAKE 1 CAPSULE BY MOUTH EVERY DAY FOR ANXIETY   FEBUXOSTAT (ULORIC) 40 MG TABLET    TAKE 1 TABLET BY MOUTH EVERY DAY   FLUTICASONE (FLONASE) 50 MCG/ACT NASAL SPRAY    Place 1 spray into both nostrils in the morning and at bedtime.   FUROSEMIDE (LASIX) 20 MG TABLET    TAKE 1 TABLET BY MOUTH EVERY DAY AS NEEDED   GOODSENSE ASPIRIN LOW DOSE 81 MG EC TABLET    TAKE 1 TABLET BY MOUTH EVERY DAY SWALLOW WHOLE   HYDRALAZINE (APRESOLINE) 50 MG TABLET    TAKE 1 TABLET BY MOUTH 2 TIMES DAILY   HYDROXYZINE (ATARAX/VISTARIL) 10 MG TABLET    Take 1 tablet by mouth three times daily as needed   INSULIN GLARGINE, 2 UNIT DIAL, (TOUJEO MAX SOLOSTAR) 300 UNIT/ML SOLOSTAR PEN    Inject 42 Units into the skin daily. Patient assistance program provides   ISOSORBIDE MONONITRATE (IMDUR) 60 MG 24 HR TABLET    TAKE 1 TABLET BY MOUTH EVERY DAY   MECLIZINE (ANTIVERT) 25 MG TABLET    TAKE 1 TABLET BY MOUTH THREE TIMES DAILY AS NEEDED FOR DIZZINESS   MEMANTINE (NAMENDA) 5 MG TABLET    Take 2 by mouth in the am and 1 by mouth in the pm   METOPROLOL SUCCINATE (TOPROL-XL) 50 MG 24 HR TABLET    Take 50 mg by mouth daily.   NITROGLYCERIN (NITROSTAT) 0.4 MG SL TABLET    Take one tablet under the tongue every 5 minutes as needed for chest pain   NOVOLOG FLEXPEN RELION 100 UNIT/ML FLEXPEN    INJECT 16 UNITS SUBCUTANEOUSLY THREE TIMES DAILY WITH MEALS. MAX DAILY DOSE OF 70 UNITS.   NYSTATIN CREAM (MYCOSTATIN)    Apply 1 application topically 2 (two) times daily.   OXYCODONE-ACETAMINOPHEN (PERCOCET) 5-325 MG TABLET    Take 1 tablet by mouth  every 12 (twelve) hours as needed for severe pain. Must last 30 days.    OXYCODONE-ACETAMINOPHEN (PERCOCET) 5-325 MG TABLET    Take 1 tablet by mouth every 12 (twelve) hours as needed for severe pain. Must last 30 days.   OXYCODONE-ACETAMINOPHEN (PERCOCET) 5-325 MG TABLET    Take 1 tablet by mouth every 12 (twelve) hours as needed for severe pain. Must last 30 days.   PANTOPRAZOLE (PROTONIX) 40 MG TABLET    TAKE 1 TABLET BY MOUTH EVERY DAY   PREGABALIN (LYRICA) 25 MG CAPSULE    Take 1 capsule (25 mg total) by mouth 2 (two) times daily.   SACCHAROMYCES BOULARDII (FLORASTOR) 250 MG CAPSULE    Take 1 capsule (250 mg total) by mouth 2 (two) times daily.   SODIUM POLYSTYRENE (SPS) 15 GM/60ML SUSPENSION    TAKE 9ms BY MOUTH ONCE A WEEK TO keep potassium down   TOPIRAMATE (TOPAMAX) 25 MG TABLET    TAKE 1 TABLET BY MOUTH EVERY DAY   VITAMIN D, ERGOCALCIFEROL, (DRISDOL) 1.25 MG (50000 UNIT) CAPS CAPSULE    TAKE 1 CAPSULE BY MOUTH ONCE WEEKLY  Modified Medications   No medications on file  Discontinued Medications   DULAGLUTIDE (TRULICITY) 1.5 MHE/1.7EYSOPN    Inject 1.5 mg into the skin once a week.    Physical Exam:  Vitals:   01/26/21 1348  BP: 132/82  Pulse: 77  Temp: (!) 97.1 F (36.2 C)  TempSrc: Temporal  SpO2: 97%  Weight: (!) 319 lb (144.7 kg)  Height: '5\' 7"'  (1.702 m)   Body mass index is 49.96 kg/m. Wt Readings from Last 3 Encounters:  01/26/21 (!) 319 lb (144.7 kg)  12/21/20 (!) 331 lb (150.1 kg)  12/17/20 (!) 331 lb 12.8 oz (150.5 kg)    Physical Exam Constitutional:      General: She is not in acute distress.    Appearance: She is well-developed. She is not diaphoretic.  HENT:     Head: Normocephalic and atraumatic.     Mouth/Throat:     Pharynx: No oropharyngeal exudate.  Eyes:     Conjunctiva/sclera: Conjunctivae normal.     Pupils: Pupils are equal, round, and reactive to light.  Cardiovascular:     Rate and Rhythm: Normal rate and regular rhythm.     Heart sounds: Normal heart sounds.  Pulmonary:     Effort: Pulmonary effort is  normal.     Breath sounds: Normal breath sounds.  Abdominal:     General: Bowel sounds are normal.     Palpations: Abdomen is soft.  Musculoskeletal:        General: Tenderness (to bilateral legs and knees) present.     Cervical back: Normal range of motion and neck supple.     Right lower leg: No edema.     Left lower leg: No edema.  Skin:    General: Skin is warm and dry.  Neurological:     Mental Status: She is alert. Mental status is at baseline.     Motor: Weakness present.     Gait: Gait abnormal.  Psychiatric:        Mood and Affect: Mood normal.    Labs reviewed: Basic Metabolic Panel: Recent Labs    03/02/20 1516 03/02/20 1517 03/03/20 1237 09/07/20 1345  NA  --  141 138 140  K  --  4.4 4.0 4.1  CL  --  103 104 107  CO2  --  26 23 21*  GLUCOSE  --  277* 254* 302*  BUN  --  29* 32* 36*  CREATININE  --  1.47* 1.98* 1.65*  CALCIUM  --  8.6* 8.6* 8.4*  TSH 2.370  --   --   --    Liver Function Tests: Recent Labs    02/23/20 1408 09/07/20 1345  AST 10* 12*  ALT 6 6  ALKPHOS 84 89  BILITOT <0.2* 0.4  PROT 7.9 7.2  ALBUMIN 2.9* 2.8*   No results for input(s): LIPASE, AMYLASE in the last 8760 hours. No results for input(s): AMMONIA in the last 8760 hours. CBC: Recent Labs    02/23/20 1408 03/02/20 1517 03/03/20 1237 09/07/20 1345  WBC 15.8* 11.8* 11.3* 12.6*  NEUTROABS 11.6*  --   --  8.9*  HGB 9.5* 9.3* 9.3* 8.5*  HCT 31.7* 29.5* 32.5* 28.6*  MCV 89.8 88 92.9 89.9  PLT 278 254 283 256   Lipid Panel: Recent Labs    02/15/20 0636  CHOL 117  HDL 40*  LDLCALC 60  TRIG 85  CHOLHDL 2.9   TSH: Recent Labs    03/02/20 1516  TSH 2.370   A1C: Lab Results  Component Value Date   HGBA1C 8.0 (A) 09/29/2020     Assessment/Plan 1. Bilateral primary osteoarthritis of knee -ongoing, continues on oxycodone- apap - DME Wheelchair manual  2. Type 2 diabetes mellitus with diabetic neuropathy, with long-term current use of insulin  ([place]) -followed by endocrinology. Encouraged dietary compliance, routine foot care/monitoring and to keep up with diabetic eye exams through ophthalmology.  - DME Wheelchair manual - Flu Vaccine QUAD High Dose(Fluad)  3. Chronic pain syndrome Taking oxycodone from pain clinic- feels like she has worsening headaches while on oxycodone.  - DME Wheelchair manual  4. Debility - DME Wheelchair manual- unable to walk due to severe OA in bilateral knees, neurpathy due to diabetes and chronic pain. Needs assistance with all ADLs. Daughter will wheel her in wheelchair as well.   5. Other chronic pulmonary embolism without acute cor pulmonale ([place]) Completed course, she is now off all anticoagulation. No signs of recurrence.   6. Chronic obstructive pulmonary disease, unspecified COPD type ([name]) Reports significant shortness of breath and cough but does not take anything routinely. Has not been using Spiriva will refill at this time.to help with symptoms She uses nebulizer occasionally.   7. Depression, major, single episode, severe ([place]) Daughter reports she is terribly depressed. On cymbalta 60 mg by mouth daily. Recommended counseling services at this time.   8. Morbid (severe) obesity due to excess calories ([place]) -education provided on healthy weight loss through increase in physical activity and proper nutrition. Activity limited due to OA, debility, chronic pain.   9. Coronary artery disease of native artery of native heart with stable angina pectoris (HCC) Stable, continues on plavix and metoprolol.  - CBC with Differential/Platelet - CMP with eGFR([facility])  10. Need for influenza vaccination - Flu Vaccine QUAD High Dose(Fluad)  11. Insomnia, unspecified type Ongoing, taking a friends medication occasionally. Advised against this. Melatonin  1 mg by mouth at bedtime recommended.   12. Hyperlipidemia LDL goal <70 Continues on lipitor 80 mg daily  - Lipid panel  13. Chronic gout  without tophus, unspecified cause, unspecified site No recent flares, continue on uloric - Uric Acid   Next appt: 4 months.  Carlos American. [place], [facility] [phone]

## 2021-01-26 NOTE — Telephone Encounter (Signed)
Appointment scheduled for 01/26/2021 with [name] to discuss Rx for Wheelchair.

## 2021-01-26 NOTE — Patient Instructions (Addendum)
Tylenol PM, meclizine and oxycodone can all contribute to your constipation.   To take melatonin start 1 mg by mouth at bedtime, can increase up to 6 mg for sleep  Talk to your pain doctor about the headaches you are getting with the oxycodone.

## 2021-01-26 NOTE — Telephone Encounter (Signed)
Pt left 2 message on referral line stating that she needs new wheelchair. Not sure if this needs virtual, office appt or just referral for DME?  Please advise  Thanks, Vilinda Blanks

## 2021-01-28 ENCOUNTER — Other Ambulatory Visit: Payer: Self-pay

## 2021-01-28 LAB — CBC WITH DIFFERENTIAL/PLATELET
Absolute Monocytes: 578 cells/uL (ref 200–950)
Basophils Absolute: 21 cells/uL (ref 0–200)
Basophils Relative: 0.2 %
Eosinophils Absolute: 332 cells/uL (ref 15–500)
Eosinophils Relative: 3.1 %
HCT: 28.9 % — ABNORMAL LOW (ref 35.0–45.0)
Hemoglobin: 8.7 g/dL — ABNORMAL LOW (ref 11.7–15.5)
Lymphs Abs: 2258 cells/uL (ref 850–3900)
MCH: 26.5 pg — ABNORMAL LOW (ref 27.0–33.0)
MCHC: 30.1 g/dL — ABNORMAL LOW (ref 32.0–36.0)
MCV: 88.1 fL (ref 80.0–100.0)
MPV: 11.3 fL (ref 7.5–12.5)
Monocytes Relative: 5.4 %
Neutro Abs: 7511 cells/uL (ref 1500–7800)
Neutrophils Relative %: 70.2 %
Platelets: 267 10*3/uL (ref 140–400)
RBC: 3.28 10*6/uL — ABNORMAL LOW (ref 3.80–5.10)
RDW: 13.8 % (ref 11.0–15.0)
Total Lymphocyte: 21.1 %
WBC: 10.7 10*3/uL (ref 3.8–10.8)

## 2021-01-28 LAB — LIPID PANEL
Cholesterol: 97 mg/dL (ref <200)
HDL: 36 mg/dL — ABNORMAL LOW (ref >=50)
LDL Cholesterol (Calc): 42 mg/dL (calc)
Non-HDL Cholesterol (Calc): 61 mg/dL (calc) (ref <130)
Total CHOL/HDL Ratio: 2.7 (calc) (ref <5.0)
Triglycerides: 109 mg/dL (ref <150)

## 2021-01-28 LAB — COMPLETE METABOLIC PANEL WITH GFR
AG Ratio: 0.9 (calc) — ABNORMAL LOW (ref 1.0–2.5)
ALT: 9 U/L (ref 6–29)
AST: 11 U/L (ref 10–35)
Albumin: 3.5 g/dL — ABNORMAL LOW (ref 3.6–5.1)
Alkaline phosphatase (APISO): 101 U/L (ref 37–153)
BUN/Creatinine Ratio: 21 (calc) (ref 6–22)
BUN: 35 mg/dL — ABNORMAL HIGH (ref 7–25)
CO2: 24 mmol/L (ref 20–32)
Calcium: 9.1 mg/dL (ref 8.6–10.4)
Chloride: 109 mmol/L (ref 98–110)
Creat: 1.66 mg/dL — ABNORMAL HIGH (ref 0.60–0.95)
Globulin: 3.7 g/dL (calc) (ref 1.9–3.7)
Glucose, Bld: 188 mg/dL — ABNORMAL HIGH (ref 65–139)
Potassium: 5.5 mmol/L — ABNORMAL HIGH (ref 3.5–5.3)
Sodium: 140 mmol/L (ref 135–146)
Total Bilirubin: 0.3 mg/dL (ref 0.2–1.2)
Total Protein: 7.2 g/dL (ref 6.1–8.1)
eGFR: 30 mL/min/{1.73_m2} — ABNORMAL LOW (ref >=60)

## 2021-01-28 LAB — URIC ACID: Uric Acid, Serum: 4.5 mg/dL (ref 2.5–7.0)

## 2021-01-28 LAB — TEST AUTHORIZATION

## 2021-01-28 LAB — IRON,TIBC AND FERRITIN PANEL
%SAT: 16 % (calc) (ref 16–45)
Ferritin: 29 ng/mL (ref 16–288)
Iron: 41 ug/dL — ABNORMAL LOW (ref 45–160)
TIBC: 264 mcg/dL (calc) (ref 250–450)

## 2021-01-28 MED ORDER — IRON (FERROUS SULFATE) 325 (65 FE) MG PO TABS: 325.0000 mg | ORAL_TABLET | Freq: Every day | ORAL | Status: DC

## 2021-01-31 DIAGNOSIS — M170 Bilateral primary osteoarthritis of knee: Secondary | ICD-10-CM | POA: Diagnosis not present

## 2021-02-01 ENCOUNTER — Other Ambulatory Visit: Payer: PPO

## 2021-02-07 ENCOUNTER — Other Ambulatory Visit: Payer: Self-pay | Admitting: Hematology

## 2021-02-07 ENCOUNTER — Other Ambulatory Visit: Payer: Self-pay

## 2021-02-07 ENCOUNTER — Ambulatory Visit: Payer: PPO | Admitting: Nurse Practitioner

## 2021-02-07 ENCOUNTER — Other Ambulatory Visit: Payer: Self-pay | Admitting: Nurse Practitioner

## 2021-02-07 MED ORDER — CLOPIDOGREL BISULFATE 75 MG PO TABS: 75.0000 mg | ORAL_TABLET | Freq: Every day | ORAL | 0 refills | Status: DC

## 2021-02-14 ENCOUNTER — Other Ambulatory Visit: Payer: Self-pay | Admitting: Internal Medicine

## 2021-02-14 DIAGNOSIS — E1140 Type 2 diabetes mellitus with diabetic neuropathy, unspecified: Secondary | ICD-10-CM

## 2021-02-14 DIAGNOSIS — Z794 Long term (current) use of insulin: Secondary | ICD-10-CM

## 2021-02-23 ENCOUNTER — Other Ambulatory Visit: Payer: Self-pay | Admitting: Hematology

## 2021-02-25 ENCOUNTER — Other Ambulatory Visit: Payer: Self-pay | Admitting: Nurse Practitioner

## 2021-02-25 DIAGNOSIS — Z853 Personal history of malignant neoplasm of breast: Secondary | ICD-10-CM

## 2021-03-02 DIAGNOSIS — M170 Bilateral primary osteoarthritis of knee: Secondary | ICD-10-CM | POA: Diagnosis not present

## 2021-03-08 ENCOUNTER — Other Ambulatory Visit: Payer: Self-pay

## 2021-03-08 DIAGNOSIS — C50812 Malignant neoplasm of overlapping sites of left female breast: Secondary | ICD-10-CM

## 2021-03-09 ENCOUNTER — Inpatient Hospital Stay: Payer: PPO | Attending: Hematology | Admitting: Hematology

## 2021-03-09 ENCOUNTER — Inpatient Hospital Stay: Payer: PPO

## 2021-03-09 ENCOUNTER — Other Ambulatory Visit: Payer: Self-pay

## 2021-03-09 VITALS — BP 122/63 | HR 82 | Temp 97.3°F | Resp 18 | Wt 316.5 lb

## 2021-03-09 DIAGNOSIS — I5032 Chronic diastolic (congestive) heart failure: Secondary | ICD-10-CM | POA: Insufficient documentation

## 2021-03-09 DIAGNOSIS — C50812 Malignant neoplasm of overlapping sites of left female breast: Secondary | ICD-10-CM | POA: Diagnosis not present

## 2021-03-09 DIAGNOSIS — I130 Hypertensive heart and chronic kidney disease with heart failure and stage 1 through stage 4 chronic kidney disease, or unspecified chronic kidney disease: Secondary | ICD-10-CM | POA: Insufficient documentation

## 2021-03-09 DIAGNOSIS — E559 Vitamin D deficiency, unspecified: Secondary | ICD-10-CM | POA: Insufficient documentation

## 2021-03-09 DIAGNOSIS — E1122 Type 2 diabetes mellitus with diabetic chronic kidney disease: Secondary | ICD-10-CM | POA: Diagnosis not present

## 2021-03-09 DIAGNOSIS — R0602 Shortness of breath: Secondary | ICD-10-CM | POA: Diagnosis not present

## 2021-03-09 DIAGNOSIS — Z86718 Personal history of other venous thrombosis and embolism: Secondary | ICD-10-CM | POA: Insufficient documentation

## 2021-03-09 DIAGNOSIS — E785 Hyperlipidemia, unspecified: Secondary | ICD-10-CM | POA: Diagnosis not present

## 2021-03-09 DIAGNOSIS — N183 Chronic kidney disease, stage 3 unspecified: Secondary | ICD-10-CM | POA: Insufficient documentation

## 2021-03-09 DIAGNOSIS — Z8349 Family history of other endocrine, nutritional and metabolic diseases: Secondary | ICD-10-CM | POA: Insufficient documentation

## 2021-03-09 DIAGNOSIS — Z888 Allergy status to other drugs, medicaments and biological substances status: Secondary | ICD-10-CM | POA: Insufficient documentation

## 2021-03-09 DIAGNOSIS — Z882 Allergy status to sulfonamides status: Secondary | ICD-10-CM | POA: Diagnosis not present

## 2021-03-09 DIAGNOSIS — D509 Iron deficiency anemia, unspecified: Secondary | ICD-10-CM

## 2021-03-09 DIAGNOSIS — Z885 Allergy status to narcotic agent status: Secondary | ICD-10-CM | POA: Diagnosis not present

## 2021-03-09 DIAGNOSIS — Z9049 Acquired absence of other specified parts of digestive tract: Secondary | ICD-10-CM | POA: Insufficient documentation

## 2021-03-09 DIAGNOSIS — Z79811 Long term (current) use of aromatase inhibitors: Secondary | ICD-10-CM | POA: Diagnosis not present

## 2021-03-09 DIAGNOSIS — Z170 Estrogen receptor positive status [ER+]: Secondary | ICD-10-CM

## 2021-03-09 DIAGNOSIS — Z7901 Long term (current) use of anticoagulants: Secondary | ICD-10-CM | POA: Diagnosis not present

## 2021-03-09 DIAGNOSIS — Z833 Family history of diabetes mellitus: Secondary | ICD-10-CM | POA: Insufficient documentation

## 2021-03-09 DIAGNOSIS — M1990 Unspecified osteoarthritis, unspecified site: Secondary | ICD-10-CM | POA: Insufficient documentation

## 2021-03-09 DIAGNOSIS — Z803 Family history of malignant neoplasm of breast: Secondary | ICD-10-CM | POA: Diagnosis not present

## 2021-03-09 DIAGNOSIS — Z8249 Family history of ischemic heart disease and other diseases of the circulatory system: Secondary | ICD-10-CM | POA: Insufficient documentation

## 2021-03-09 DIAGNOSIS — Z86711 Personal history of pulmonary embolism: Secondary | ICD-10-CM | POA: Insufficient documentation

## 2021-03-09 DIAGNOSIS — Z9884 Bariatric surgery status: Secondary | ICD-10-CM | POA: Insufficient documentation

## 2021-03-09 DIAGNOSIS — Z808 Family history of malignant neoplasm of other organs or systems: Secondary | ICD-10-CM | POA: Insufficient documentation

## 2021-03-09 DIAGNOSIS — G4733 Obstructive sleep apnea (adult) (pediatric): Secondary | ICD-10-CM | POA: Diagnosis not present

## 2021-03-09 DIAGNOSIS — Z955 Presence of coronary angioplasty implant and graft: Secondary | ICD-10-CM | POA: Insufficient documentation

## 2021-03-09 DIAGNOSIS — Z8261 Family history of arthritis: Secondary | ICD-10-CM | POA: Insufficient documentation

## 2021-03-09 DIAGNOSIS — K219 Gastro-esophageal reflux disease without esophagitis: Secondary | ICD-10-CM | POA: Insufficient documentation

## 2021-03-09 DIAGNOSIS — M8580 Other specified disorders of bone density and structure, unspecified site: Secondary | ICD-10-CM | POA: Diagnosis not present

## 2021-03-09 LAB — CBC WITH DIFFERENTIAL (CANCER CENTER ONLY)
Abs Immature Granulocytes: 0.05 10*3/uL (ref 0.00–0.07)
Basophils Absolute: 0 10*3/uL (ref 0.0–0.1)
Basophils Relative: 0 %
Eosinophils Absolute: 0.3 10*3/uL (ref 0.0–0.5)
Eosinophils Relative: 3 %
HCT: 29.8 % — ABNORMAL LOW (ref 36.0–46.0)
Hemoglobin: 8.9 g/dL — ABNORMAL LOW (ref 12.0–15.0)
Immature Granulocytes: 0 %
Lymphocytes Relative: 23 %
Lymphs Abs: 2.5 10*3/uL (ref 0.7–4.0)
MCH: 26.7 pg (ref 26.0–34.0)
MCHC: 29.9 g/dL — ABNORMAL LOW (ref 30.0–36.0)
MCV: 89.5 fL (ref 80.0–100.0)
Monocytes Absolute: 0.6 10*3/uL (ref 0.1–1.0)
Monocytes Relative: 5 %
Neutro Abs: 7.6 10*3/uL (ref 1.7–7.7)
Neutrophils Relative %: 69 %
Platelet Count: 296 10*3/uL (ref 150–400)
RBC: 3.33 MIL/uL — ABNORMAL LOW (ref 3.87–5.11)
RDW: 15.3 % (ref 11.5–15.5)
WBC Count: 11.1 10*3/uL — ABNORMAL HIGH (ref 4.0–10.5)
nRBC: 0 % (ref 0.0–0.2)

## 2021-03-09 LAB — CMP (CANCER CENTER ONLY)
ALT: 8 U/L (ref 0–44)
AST: 10 U/L — ABNORMAL LOW (ref 15–41)
Albumin: 3 g/dL — ABNORMAL LOW (ref 3.5–5.0)
Alkaline Phosphatase: 101 U/L (ref 38–126)
Anion gap: 8 (ref 5–15)
BUN: 45 mg/dL — ABNORMAL HIGH (ref 8–23)
CO2: 22 mmol/L (ref 22–32)
Calcium: 8.7 mg/dL — ABNORMAL LOW (ref 8.9–10.3)
Chloride: 109 mmol/L (ref 98–111)
Creatinine: 2.13 mg/dL — ABNORMAL HIGH (ref 0.44–1.00)
GFR, Estimated: 22 mL/min — ABNORMAL LOW (ref >60)
Glucose, Bld: 174 mg/dL — ABNORMAL HIGH (ref 70–99)
Potassium: 5 mmol/L (ref 3.5–5.1)
Sodium: 139 mmol/L (ref 135–145)
Total Bilirubin: <0.2 mg/dL — ABNORMAL LOW (ref 0.3–1.2)
Total Protein: 7.8 g/dL (ref 6.5–8.1)

## 2021-03-09 LAB — FERRITIN: Ferritin: 39 ng/mL (ref 11–307)

## 2021-03-09 MED ORDER — POLYSACCHARIDE IRON COMPLEX 150 MG PO CAPS: 150.0000 mg | ORAL_CAPSULE | Freq: Every day | ORAL | 5 refills | Status: DC

## 2021-03-14 ENCOUNTER — Encounter: Payer: Self-pay | Admitting: Adult Health

## 2021-03-14 ENCOUNTER — Ambulatory Visit (INDEPENDENT_AMBULATORY_CARE_PROVIDER_SITE_OTHER): Payer: PPO | Admitting: Adult Health

## 2021-03-14 ENCOUNTER — Other Ambulatory Visit: Payer: Self-pay

## 2021-03-14 VITALS — BP 138/88 | HR 82 | Temp 97.0°F

## 2021-03-14 DIAGNOSIS — G894 Chronic pain syndrome: Secondary | ICD-10-CM | POA: Diagnosis not present

## 2021-03-14 DIAGNOSIS — K5903 Drug induced constipation: Secondary | ICD-10-CM | POA: Diagnosis not present

## 2021-03-14 DIAGNOSIS — J45991 Cough variant asthma: Secondary | ICD-10-CM | POA: Diagnosis not present

## 2021-03-14 MED ORDER — SENNA-DOCUSATE SODIUM 8.6-50 MG PO TABS: 2.0000 | ORAL_TABLET | Freq: Two times a day (BID) | ORAL | 3 refills | Status: DC

## 2021-03-14 MED ORDER — LINACLOTIDE 145 MCG PO CAPS: 145.0000 ug | ORAL_CAPSULE | Freq: Every day | ORAL | 2 refills | Status: DC

## 2021-03-14 MED ORDER — GUAIFENESIN ER 600 MG PO TB12: 600.0000 mg | ORAL_TABLET | Freq: Two times a day (BID) | ORAL | 3 refills | Status: DC | PRN

## 2021-03-14 MED ORDER — POLYETHYLENE GLYCOL 3350 17 GM/SCOOP PO POWD: ORAL | 0 refills | Status: DC

## 2021-03-14 NOTE — Patient Instructions (Signed)
Constipation, Adult Constipation is when a person has fewer than three bowel movements in a week, has difficulty having a bowel movement, or has stools (feces) that are dry, hard, or larger than normal. Constipation may be caused by an underlying condition. It may become worse with age if a person takes certain medicines and does not take in enough fluids. Follow these instructions at home: Eating and drinking  Eat foods that have a lot of fiber, such as beans, whole grains, and fresh fruits and vegetables. Limit foods that are low in fiber and high in fat and processed sugars, such as fried or sweet foods. These include french fries, hamburgers, cookies, candies, and soda. Drink enough fluid to keep your urine pale yellow. General instructions Exercise regularly or as told by your health care provider. Try to do 150 minutes of moderate exercise each week. Use the bathroom when you have the urge to go. Do not hold it in. Take over-the-counter and prescription medicines only as told by your health care provider. This includes any fiber supplements. During bowel movements: Practice deep breathing while relaxing the lower abdomen. Practice pelvic floor relaxation. Watch your condition for any changes. Let your health care provider know about them. Keep all follow-up visits as told by your health care provider. This is important. Contact a health care provider if: You have pain that gets worse. You have a fever. You do not have a bowel movement after 4 days. You vomit. You are not hungry or you lose weight. You are bleeding from the opening between the buttocks (anus). You have thin, pencil-like stools. Get help right away if: You have a fever and your symptoms suddenly get worse. You leak stool or have blood in your stool. Your abdomen is bloated. You have severe pain in your abdomen. You feel dizzy or you faint. Summary Constipation is when a person has fewer than three bowel movements  in a week, has difficulty having a bowel movement, or has stools (feces) that are dry, hard, or larger than normal. Eat foods that have a lot of fiber, such as beans, whole grains, and fresh fruits and vegetables. Drink enough fluid to keep your urine pale yellow. Take over-the-counter and prescription medicines only as told by your health care provider. This includes any fiber supplements. This information is not intended to replace advice given to you by your health care provider. Make sure you discuss any questions you have with your health care provider. Document Revised: 03/12/2019 Document Reviewed: 03/12/2019 Elsevier Patient Education  2022 [employer]  

## 2021-03-14 NOTE — Progress Notes (Signed)
[facility] clinic  Provider:  Durenda Age  DNP   Code Status:  DNR  Goals of Care:  Advanced Directives 12/21/2020  Does Patient Have a Medical Advance Directive? Yes  Type of Advance Directive -  Does patient want to make changes to medical advance directive? -  Copy of [place] in Chart? -  Would patient like information on creating a medical advance directive? -  Pre-existing out of facility DNR order (yellow form or pink MOST form) -     Chief Complaint  Patient presents with   Acute Visit    Patient today for constipation for 2 weeks. Doculax, miralex, all nautral pills, fiber    HPI: Patient is a 86 y.o. female seen today for an acute visit for constipation. Patient was accompanied by daughter, [name]. Daughter stated that she has been having constipation X 2 weeks. She has tried Dulcolax, miralax, fiber and natural pills. She takes Percocet 5-325 mg every 12 hours PRN for and Lyrica 25 mg twice a day for pain. Patient stated that she sometimes take it once a week, three times a week or sometimes everyday. During the visit, she started having dry cough. She said that she feels like she has phlegm that gets stocked in her throat. She takes Niferex 150 mg daily for anemia.  Past Medical History:  Diagnosis Date   Allergy    takes Mucinex daily as needed   Anemia, unspecified    Anxiety    takes Clonazepam daily as needed   Arthritis    Breast cancer ([place])    Chronic diastolic CHF (congestive heart failure) (HCC)    takes Furosemide daily   CKD (chronic kidney disease), stage III ([place])    Coronary artery disease    a. s/p IMI 2004 tx with BMS to RCA;  b. s/p Promus DES to RCA 2/12 c. abnormal nuc 2016 -> cath with med rx. d. NSTEMI 02/2020  mv CAD with severe ISR in pRCA and m/dRCA lesion both treated with DES.   Dementia ([place])    Depression    takes Cymbalta daily   Diabetes mellitus    insulin daily   GERD (gastroesophageal reflux disease)     takes Protonix daily   Gout, unspecified    Headache    occasionally   History of blood clots    Hyperlipidemia    takes Pravastatin daily   Hypertension    Insomnia    Joint pain    Joint swelling    Morbid obesity ([place])    Myocardial infarction ([place]) 2004   Non-STEMI (non-ST elevated myocardial infarction) ([place]) 02/15/2020   Numbness    Obstructive sleep apnea    does not wear cpap   Osteoarthritis    Osteoarthritis    Osteoarthrosis, unspecified whether generalized or localized, lower leg    Pain, chronic    Polymyalgia rheumatica ([place])    Pulmonary emboli ([place]) 01/2012   felt to need lifelong anticoagulation but Xarelto dc in 02/2020 due to need for DAPT   Urinary incontinence    takes Linzess daily   Vertigo    hx of;was taking Meclizine if needed   Wears dentures     Past Surgical History:  Procedure Laterality Date   ABDOMINAL HYSTERECTOMY     partial   APPENDECTOMY     blood clots/legs and lungs  2013   BREAST BIOPSY Left 07/22/2014   BREAST BIOPSY Left 02/10/2013   BREAST LUMPECTOMY Left 11/05/2014  BREAST LUMPECTOMY WITH RADIOACTIVE SEED LOCALIZATION Left 11/05/2014   Procedure: LEFT BREAST LUMPECTOMY WITH RADIOACTIVE SEED LOCALIZATION;  Surgeon: [name], MD;  Location: [place];  Service: General;  Laterality: Left;   CARDIAC CATHETERIZATION     COLONOSCOPY     CORONARY ANGIOPLASTY  2   CORONARY STENT INTERVENTION N/A 02/17/2020   Procedure: CORONARY STENT INTERVENTION;  Surgeon: Leonie Man, MD;  Location: [place] [facility];  Service: Cardiovascular;  Laterality: N/A;   ESOPHAGOGASTRODUODENOSCOPY (EGD) WITH PROPOFOL N/A 11/07/2016   Procedure: ESOPHAGOGASTRODUODENOSCOPY (EGD) WITH PROPOFOL;  Surgeon: Gatha Mayer, MD;  Location: [facility];  Service: Endoscopy;  Laterality: N/A;   EXCISION OF SKIN TAG Right 11/05/2014   Procedure: EXCISION OF RIGHT EYELID SKIN TAG;  Surgeon: [name], MD;  Location: [place];  Service: General;   Laterality: Right;   EYE SURGERY Bilateral    cataract    GASTRIC BYPASS  1977    reversed in 1979, [place] CATH AND CORONARY ANGIOGRAPHY N/A 02/17/2020   Procedure: LEFT HEART CATH AND CORONARY ANGIOGRAPHY;  Surgeon: Leonie Man, MD;  Location: [place] [facility];  Service: Cardiovascular;  Laterality: N/A;   LEFT HEART CATHETERIZATION WITH CORONARY ANGIOGRAM N/A 06/29/2014   Procedure: LEFT HEART CATHETERIZATION WITH CORONARY ANGIOGRAM;  Surgeon: Troy Sine, MD;  Location: [facility] C[facility];  Service: Cardiovascular;  Laterality: N/A;   MEMBRANE PEEL Right 10/23/2018   Procedure: MEMBRANE PEEL;  Surgeon: Hurman Horn, MD;  Location: [place];  Service: Ophthalmology;  Laterality: Right;   MI with stent placement  2004   PARS PLANA VITRECTOMY Right 10/23/2018   Procedure: PARS PLANA VITRECTOMY WITH 25 GAUGE;  Surgeon: Hurman Horn, MD;  Location: [place];  Service: Ophthalmology;  Laterality: Right;    Allergies  Allergen Reactions   Sulfonamide Derivatives Swelling    Mouth swelling   Aricept [Donepezil Hcl] Diarrhea and Nausea And Vomiting    GI upset/loose stools   Tramadol Nausea And Vomiting    Outpatient Encounter Medications as of 03/14/2021  Medication Sig   albuterol (PROAIR HFA) 108 (90 Base) MCG/ACT inhaler Inhale 1-2 puffs into the lungs every 6 (six) hours as needed for wheezing or shortness of breath.   albuterol (PROVENTIL) (2.5 MG/3ML) 0.083% nebulizer solution Take 3 mLs (2.5 mg total) by nebulization every 6 (six) hours as needed for wheezing or shortness of breath.   anastrozole (ARIMIDEX) 1 MG tablet TAKE 1 TABLET BY MOUTH EVERY DAY   atorvastatin (LIPITOR) 80 MG tablet TAKE 1 TABLET BY MOUTH EVERY DAY   budesonide-formoterol (SYMBICORT) 160-4.5 MCG/ACT inhaler Inhale 2 puffs into the lungs 2 (two) times daily.   cetirizine (ZYRTEC) 10 MG tablet TAKE 1 TABLET BY MOUTH EVERY DAY   clopidogrel (PLAVIX) 75 MG tablet Take 1 tablet (75 mg total) by  mouth daily. Please make overdue appt with Dr. Johney Frame (new Cardiologist) before anymore refills. Thank you 1st attempt   Continuous Blood Gluc Sensor (FREESTYLE LIBRE 14 DAY SENSOR) MISC USE TO INJECT 1 DEVICE INTO THE SKIN AS DIRECTED   Cyanocobalamin (VITAMIN B-12 PO) Take 1 tablet by mouth once a week.    DULoxetine (CYMBALTA) 60 MG capsule TAKE 1 CAPSULE BY MOUTH EVERY DAY FOR ANXIETY   febuxostat (ULORIC) 40 MG tablet TAKE 1 TABLET BY MOUTH EVERY DAY   fluticasone (FLONASE) 50 MCG/ACT nasal spray Place 1 spray into both nostrils in the morning and at bedtime.   furosemide (LASIX)  20 MG tablet TAKE 1 TABLET BY MOUTH EVERY DAY AS NEEDED   GOODSENSE ASPIRIN LOW DOSE 81 MG EC tablet TAKE 1 TABLET BY MOUTH EVERY DAY SWALLOW WHOLE   hydrALAZINE (APRESOLINE) 50 MG tablet TAKE 1 TABLET BY MOUTH 2 TIMES DAILY   insulin glargine, 2 Unit Dial, (TOUJEO MAX SOLOSTAR) 300 UNIT/ML Solostar Pen Inject 42 Units into the skin daily. Patient assistance program provides   iron polysaccharides (NIFEREX) 150 MG capsule Take 1 capsule (150 mg total) by mouth daily.   isosorbide mononitrate (IMDUR) 60 MG 24 hr tablet TAKE 1 TABLET BY MOUTH EVERY DAY   meclizine (ANTIVERT) 25 MG tablet TAKE 1 TABLET BY MOUTH THREE TIMES DAILY AS NEEDED FOR DIZZINESS   memantine (NAMENDA) 5 MG tablet Take 2 by mouth in the am and 1 by mouth in the pm   metoprolol succinate (TOPROL-XL) 50 MG 24 hr tablet Take 50 mg by mouth daily.   nitroGLYCERIN (NITROSTAT) 0.4 MG SL tablet Take one tablet under the tongue every 5 minutes as needed for chest pain   NOVOLOG FLEXPEN RELION 100 UNIT/ML FlexPen INJECT 16 UNITS SUBCUTANEOUSLY THREE TIMES DAILY WITH MEALS. MAX DAILY DOSE OF 70 UNITS.   nystatin cream (MYCOSTATIN) Apply 1 application topically 2 (two) times daily.   oxyCODONE-acetaminophen (PERCOCET) 5-325 MG tablet Take 1 tablet by mouth every 12 (twelve) hours as needed for severe pain. Must last 30 days.   pantoprazole (PROTONIX) 40 MG  tablet TAKE 1 TABLET BY MOUTH EVERY DAY   pregabalin (LYRICA) 25 MG capsule Take 1 capsule (25 mg total) by mouth 2 (two) times daily.   saccharomyces boulardii (FLORASTOR) 250 MG capsule Take 1 capsule (250 mg total) by mouth 2 (two) times daily.   sodium polystyrene (SPS) 15 GM/60ML suspension TAKE 36mls BY MOUTH ONCE A WEEK TO keep potassium down   topiramate (TOPAMAX) 25 MG tablet TAKE 1 TABLET BY MOUTH EVERY DAY   Vitamin D, Ergocalciferol, (DRISDOL) 1.25 MG (50000 UNIT) CAPS capsule TAKE 1 CAPSULE BY MOUTH ONCE WEEKLY   No facility-administered encounter medications on file as of 03/14/2021.    Review of Systems:  Review of Systems  Constitutional:  Negative for activity change, chills and fever.  Eyes: Negative.   Respiratory:  Positive for cough. Negative for wheezing.   Cardiovascular:  Negative for leg swelling.  Gastrointestinal:  Positive for constipation.  Genitourinary:  Negative for difficulty urinating.  Skin: Negative.   Neurological: Negative.   Psychiatric/Behavioral: Negative.     Health Maintenance  Topic Date Due   Zoster Vaccines- Shingrix (1 of 2) Never done   TETANUS/TDAP  05/08/2018   COVID-19 Vaccine (4 - Booster for Pfizer series) 02/24/2020   OPHTHALMOLOGY EXAM  08/26/2020   MAMMOGRAM  02/19/2021   FOOT EXAM  02/25/2021   HEMOGLOBIN A1C  04/01/2021   Pneumonia Vaccine 77+ Years old  Completed   INFLUENZA VACCINE  Completed   DEXA SCAN  Completed   HPV VACCINES  Aged Out    Physical Exam: Vitals:   03/14/21 1457  BP: 138/88  Pulse: 82  Temp: (!) 97 F (36.1 C)  SpO2: 98%   There is no height or weight on file to calculate BMI. Physical Exam HENT:     Head: Normocephalic and atraumatic.     Nose: No congestion.     Mouth/Throat:     Mouth: Mucous membranes are moist.     Pharynx: Oropharynx is clear.  Cardiovascular:     Rate and  Rhythm: Normal rate and regular rhythm.     Pulses: Normal pulses.     Heart sounds: Normal heart sounds.   Pulmonary:     Effort: Pulmonary effort is normal.     Breath sounds: Normal breath sounds. No wheezing.  Abdominal:     Palpations: Abdomen is soft.  Musculoskeletal:        General: Normal range of motion.     Cervical back: Normal range of motion.  Skin:    General: Skin is warm and dry.  Neurological:     General: No focal deficit present.     Mental Status: She is alert and oriented to person, place, and time.  Psychiatric:        Mood and Affect: Mood normal.        Behavior: Behavior normal.        Thought Content: Thought content normal.        Judgment: Judgment normal.    Labs reviewed: Basic Metabolic Panel: Recent Labs    09/07/20 1345 01/26/21 1405 03/09/21 1352  NA 140 140 139  K 4.1 5.5* 5.0  CL 107 109 109  CO2 21* 24 22  GLUCOSE 302* 188* 174*  BUN 36* 35* 45*  CREATININE 1.65* 1.66* 2.13*  CALCIUM 8.4* 9.1 8.7*   Liver Function Tests: Recent Labs    09/07/20 1345 01/26/21 1405 03/09/21 1352  AST 12* 11 10*  ALT 6 9 8   ALKPHOS 89  --  101  BILITOT 0.4 0.3 <0.2*  PROT 7.2 7.2 7.8  ALBUMIN 2.8*  --  3.0*   No results for input(s): LIPASE, AMYLASE in the last 8760 hours. No results for input(s): AMMONIA in the last 8760 hours. CBC: Recent Labs    09/07/20 1345 01/26/21 1405 03/09/21 1352  WBC 12.6* 10.7 11.1*  NEUTROABS 8.9* 7,511 7.6  HGB 8.5* 8.7* 8.9*  HCT 28.6* 28.9* 29.8*  MCV 89.9 88.1 89.5  PLT 256 267 296   Lipid Panel: Recent Labs    01/26/21 1405  CHOL 97  HDL 36*  LDLCALC 42  TRIG 109  CHOLHDL 2.7   Lab Results  Component Value Date   HGBA1C 8.0 (A) 09/29/2020    Procedures since last visit: No results found.  Assessment/Plan  1. Drug-induced constipation - Percocet and Niferex can both cause constipation -   advised to take [place] smoothies together with frozen mango, dates and flaxseed - linaclotide (LINZESS) 145 MCG CAPS capsule; Take 1 capsule (145 mcg total) by mouth daily before breakfast.  Dispense:  30 capsule; Refill: 2 - sennosides-docusate sodium (SENOKOT-S) 8.6-50 MG tablet; Take 2 tablets by mouth 2 (two) times daily.  Dispense: 60 tablet; Refill: 3 - polyethylene glycol powder (MIRALAX) 17 GM/SCOOP powder; 17 gm mix with liquid twice daily  Dispense: 255 g; Refill: 0  2. Chronic pain syndrome -  stable, continue Percocet PRN and Lyrica  3. Cough variant asthma -  no wheezing -  continue Taking Flonase 50 mcg/act 1 spray into both nostrils in the morning and at bedtime and Symbicort 160-4.5 mcg/ACT 2 puffs into lungs BID - guaiFENesin (MUCINEX) 600 MG 12 hr tablet; Take 1 tablet (600 mg total) by mouth 2 (two) times daily as needed.  Dispense: 60 tablet; Refill: 3      Labs/tests ordered:  None  Next appt:  04/25/2021

## 2021-03-15 NOTE — Progress Notes (Addendum)
.    Hematology/Oncology Clinic Followup Note  Date of service 03/16/21   Patient Care Team: [name], NP as PCP - General (Geriatric Medicine) [name], MD (Inactive) as PCP - Cardiology (Cardiology) [name], [name], MD (Inactive) as Consulting Physician (Cardiology) [name], MD as Consulting Physician (Ophthalmology) [name], MD as Consulting Physician (General Surgery) [name], MD (Inactive) as Referring Physician (Obstetrics and Gynecology) [name] [name], MD as Consulting Physician (Ophthalmology) [facility], [name], MD as Consulting Physician (Endocrinology) [name], MD as Consulting Physician (Hematology) [name], DO as Consulting Physician (Neurology)  CHIEF COMPLAINTS/PURPOSE OF CONSULTATION:  F/u for breast cancer.  DIAGNOSIS: left-sided presumed stage IA  (pT1c,pNx[cN0], cM0) invasive ductal carcinoma grade 2 out of 3, strongly ER +100%, strongly PR +100% and HER-2/neu negative. Given her high risk cardiac status lumpectomy had to be done under local anesthesia and sentinel lymph node biopsy was not possible. She has accompanying DCIS of intermediate grade present at margins. ECOG performance status is 3. -Had a mammogram/tomosynthesis done on 07/29/2015 which shows residual suspicious calcification in the left breast postero-medial to the biopsy site. Has known residual DCIS.  No evidence of new malignancy.  No evidence of right breast malignancy.   CURRENT TREATMENT: Arimidex 58m po daily  HISTORY OF PRESENTING ILLNESS: -please see my initial consultation for details of initial presentation   INTERVAL HISTORY: [name] here for management and evaluation of her breast cancer and anemia today. We are joined today by her daughters. [name] last visit with [facility] on [id number].   Patient notes continued fatigue which we discussed is multifactorial including her anemia and multiple comorbidities as  well as uncontrolled diabetes.  She notes some discomfort in her right breast but no palpable masses.  No other acute new focal symptoms. Notes no evidence of black stools or blood in the stools.  Labs today show hemoglobin of 8.9 with WBC count of 11.1k and platelets of 296k. CMP shows creatinine of 2.13.  Glucose level of 174 albumin of 3 Ferritin of 39.   MEDICAL HISTORY:  Past Medical History:  Diagnosis Date   Allergy    takes Mucinex daily as needed   Anemia, unspecified    Anxiety    takes Clonazepam daily as needed   Arthritis    Breast cancer ([facility]    Chronic diastolic CHF (congestive heart failure) (HCC)    takes Furosemide daily   CKD (chronic kidney disease), stage III ([facility]    Coronary artery disease    a. s/p IMI 2004 tx with BMS to RCA;  b. s/p Promus DES to RCA 2/12 c. abnormal nuc 2016 -> cath with med rx. d. NSTEMI 02/2020  mv CAD with severe ISR in pRCA and m/dRCA lesion both treated with DES.   Dementia ([facility]    Depression    takes Cymbalta daily   Diabetes mellitus    insulin daily   GERD (gastroesophageal reflux disease)    takes Protonix daily   Gout, unspecified    Headache    occasionally   History of blood clots    Hyperlipidemia    takes Pravastatin daily   Hypertension    Insomnia    Joint pain    Joint swelling    Morbid obesity ([facility]    [place]ocardial [place]   Non-[place] (non-ST elevated [place] 02/[place]   Numbness    Obstructive sleep apnea    does not wear  cpap   Osteoarthritis    Osteoarthritis    Osteoarthrosis, unspecified whether generalized or localized, lower leg    Pain, chronic    Polymyalgia rheumatica ([place])    Pulmonary emboli ([place]) 01/2012   felt to need lifelong anticoagulation but Xarelto dc in 02/2020 due to need for DAPT   Urinary incontinence    takes Linzess daily   Vertigo    hx of;was taking Meclizine if needed   Wears dentures     SURGICAL HISTORY: Past Surgical History:   Procedure Laterality Date   ABDOMINAL HYSTERECTOMY     partial   APPENDECTOMY     blood clots/legs and lungs  2013   BREAST BIOPSY Left 07/22/2014   BREAST BIOPSY Left 02/10/2013   BREAST LUMPECTOMY Left 11/05/2014   BREAST LUMPECTOMY WITH RADIOACTIVE SEED LOCALIZATION Left 11/05/2014   Procedure: LEFT BREAST LUMPECTOMY WITH RADIOACTIVE SEED LOCALIZATION;  Surgeon: [name], MD;  Location: [facility];  Service: General;  Laterality: Left;   CARDIAC CATHETERIZATION     COLONOSCOPY     CORONARY ANGIOPLASTY  2   CORONARY STENT INTERVENTION N/A 02/17/2020   Procedure: CORONARY STENT INTERVENTION;  Surgeon: Leonie Man, MD;  Location: [facility];  Service: Cardiovascular;  Laterality: N/A;   ESOPHAGOGASTRODUODENOSCOPY (EGD) WITH PROPOFOL N/A 11/07/2016   Procedure: ESOPHAGOGASTRODUODENOSCOPY (EGD) WITH PROPOFOL;  Surgeon: Gatha Mayer, MD;  [facility]: [facility];  Service: Endoscopy;  Laterality: N/A;   EXCISION OF SKIN TAG Right 11/05/2014   Procedure: EXCISION OF RIGHT EYELID SKIN TAG;  Surgeon: [name], MD;  Location: [facility];  Service: General;  Laterality: Right;   EYE SURGERY Bilateral    cataract    GASTRIC BYPASS  1977    reversed in 1979, [place] CATH AND CORONARY ANGIOGRAPHY N/A 02/17/2020   Procedure: LEFT HEART CATH AND CORONARY ANGIOGRAPHY;  Surgeon: Leonie Man, MD;  Location: [place];  Service: Cardiovascular;  Laterality: N/A;   LEFT HEART CATHETERIZATION WITH CORONARY ANGIOGRAM N/A 06/29/2014   Procedure: LEFT HEART CATHETERIZATION WITH CORONARY ANGIOGRAM;  Surgeon: Troy Sine, MD;  Location: [facility];  Service: Cardiovascular;  Laterality: N/A;   MEMBRANE PEEL Right 10/23/2018   Procedure: MEMBRANE PEEL;  Surgeon: Hurman Horn, MD;  Location: [facility];  Service: Ophthalmology;  Laterality: Right;   MI with stent placement  2004   PARS PLANA VITRECTOMY Right 10/23/2018   Procedure: PARS PLANA VITRECTOMY WITH 25  GAUGE;  Surgeon: Hurman Horn, MD;  Location: [facility];  Service: Ophthalmology;  Laterality: Right;    SOCIAL HISTORY: Social History   Socioeconomic History   Marital status: Widowed    Spouse name: Not on file   Number of children: Not on file   Years of education: Not on file   Highest education level: Not on file  Occupational History   Occupation: retired  Tobacco Use   Smoking status: Never   Smokeless tobacco: Never  Vaping Use   Vaping Use: Never used  Substance and Sexual Activity   Alcohol use: No    Alcohol/week: 0.0 standard drinks   Drug use: No   Sexual activity: Not Currently  Other Topics Concern   Not on file  Social History Narrative   Not on file   Social Determinants of Health   Financial Resource Strain: Not on file  Food Insecurity: Not on file  Transportation Needs: Not on file  Physical Activity: Not on file  Stress: Not on file  Social Connections: Not on file  Intimate Partner Violence: Not on file    FAMILY HISTORY: Family History  Problem Relation Age of Onset   Breast cancer Mother 54   Heart disease Mother    Throat cancer Father    Hypertension Father    Arthritis Father    Diabetes Father    Arthritis Sister    Obesity Sister    Diabetes Sister    Heart disease Cousin    Colon cancer Neg Hx    Stomach cancer Neg Hx    Esophageal cancer Neg Hx     ALLERGIES:  is allergic to sulfonamide derivatives, aricept [donepezil hcl], and tramadol.  MEDICATIONS:  Current Outpatient Medications  Medication Sig Dispense Refill   iron polysaccharides (NIFEREX) 150 MG capsule Take 1 capsule (150 mg total) by mouth daily. 30 capsule 5   albuterol (PROAIR HFA) 108 (90 Base) MCG/ACT inhaler Inhale 1-2 puffs into the lungs every 6 (six) hours as needed for wheezing or shortness of breath. 6.7 g 1   albuterol (PROVENTIL) (2.5 MG/3ML) 0.083% nebulizer solution Take 3 mLs (2.5 mg total) by nebulization every 6 (six) hours as needed for wheezing  or shortness of breath. 150 mL 1   anastrozole (ARIMIDEX) 1 MG tablet TAKE 1 TABLET BY MOUTH EVERY DAY 28 tablet 1   atorvastatin (LIPITOR) 80 MG tablet TAKE 1 TABLET BY MOUTH EVERY DAY 90 tablet 3   budesonide-formoterol (SYMBICORT) 160-4.5 MCG/ACT inhaler Inhale 2 puffs into the lungs 2 (two) times daily. 1 each 3   cetirizine (ZYRTEC) 10 MG tablet TAKE 1 TABLET BY MOUTH EVERY DAY 90 tablet 1   clopidogrel (PLAVIX) 75 MG tablet Take 1 tablet (75 mg total) by mouth daily. Please make overdue appt with Dr. Johney Frame (new Cardiologist) before anymore refills. Thank you 1st attempt 30 tablet 0   Continuous Blood Gluc Sensor (FREESTYLE LIBRE 14 DAY SENSOR) MISC USE TO INJECT 1 DEVICE INTO THE SKIN AS DIRECTED 6 each 0   Cyanocobalamin (VITAMIN B-12 PO) Take 1 tablet by mouth once a week.      DULoxetine (CYMBALTA) 60 MG capsule TAKE 1 CAPSULE BY MOUTH EVERY DAY FOR ANXIETY 30 capsule 5   febuxostat (ULORIC) 40 MG tablet TAKE 1 TABLET BY MOUTH EVERY DAY 30 tablet 5   fluticasone (FLONASE) 50 MCG/ACT nasal spray Place 1 spray into both nostrils in the morning and at bedtime. 11.1 mL 3   furosemide (LASIX) 20 MG tablet TAKE 1 TABLET BY MOUTH EVERY DAY AS NEEDED 30 tablet 3   GOODSENSE ASPIRIN LOW DOSE 81 MG EC tablet TAKE 1 TABLET BY MOUTH EVERY DAY SWALLOW WHOLE 30 tablet 11   guaiFENesin (MUCINEX) 600 MG 12 hr tablet Take 1 tablet (600 mg total) by mouth 2 (two) times daily as needed. 60 tablet 3   hydrALAZINE (APRESOLINE) 50 MG tablet TAKE 1 TABLET BY MOUTH 2 TIMES DAILY 60 tablet 5   insulin glargine, 2 Unit Dial, (TOUJEO MAX SOLOSTAR) 300 UNIT/ML Solostar Pen Inject 42 Units into the skin daily. Patient assistance program provides 15 mL 6   isosorbide mononitrate (IMDUR) 60 MG 24 hr tablet TAKE 1 TABLET BY MOUTH EVERY DAY 30 tablet 5   linaclotide (LINZESS) 145 MCG CAPS capsule Take 1 capsule (145 mcg total) by mouth daily before breakfast. 30 capsule 2   meclizine (ANTIVERT) 25 MG tablet TAKE 1  TABLET BY MOUTH THREE TIMES DAILY AS NEEDED FOR DIZZINESS 90 tablet 1  memantine (NAMENDA) 5 MG tablet Take 2 by mouth in the am and 1 by mouth in the pm 270 tablet 5   metoprolol succinate (TOPROL-XL) 50 MG 24 hr tablet Take 50 mg by mouth daily.     nitroGLYCERIN (NITROSTAT) 0.4 MG SL tablet Take one tablet under the tongue every 5 minutes as needed for chest pain 50 tablet 0   NOVOLOG FLEXPEN RELION 100 UNIT/ML FlexPen INJECT 16 UNITS SUBCUTANEOUSLY THREE TIMES DAILY WITH MEALS. MAX DAILY DOSE OF 70 UNITS. 60 mL 0   nystatin cream (MYCOSTATIN) Apply 1 application topically 2 (two) times daily. 30 g 0   oxyCODONE-acetaminophen (PERCOCET) 5-325 MG tablet Take 1 tablet by mouth every 12 (twelve) hours as needed for severe pain. Must last 30 days. 60 tablet 0   pantoprazole (PROTONIX) 40 MG tablet TAKE 1 TABLET BY MOUTH EVERY DAY 90 tablet 1   polyethylene glycol powder (MIRALAX) 17 GM/SCOOP powder 17 gm mix with liquid twice daily 255 g 0   pregabalin (LYRICA) 25 MG capsule Take 1 capsule (25 mg total) by mouth 2 (two) times daily. 60 capsule 5   saccharomyces boulardii (FLORASTOR) 250 MG capsule Take 1 capsule (250 mg total) by mouth 2 (two) times daily. 20 capsule 0   sennosides-docusate sodium (SENOKOT-S) 8.6-50 MG tablet Take 2 tablets by mouth 2 (two) times daily. 60 tablet 3   sodium polystyrene (SPS) 15 GM/60ML suspension TAKE 96ms BY MOUTH ONCE A WEEK TO keep potassium down 240 mL 2   topiramate (TOPAMAX) 25 MG tablet TAKE 1 TABLET BY MOUTH EVERY DAY 30 tablet 5   Vitamin D, Ergocalciferol, (DRISDOL) 1.25 MG (50000 UNIT) CAPS capsule TAKE 1 CAPSULE BY MOUTH ONCE WEEKLY 4 capsule 1   No current facility-administered medications for this visit.    REVIEW OF SYSTEMS:   .10 Point review of Systems was done is negative except as noted above.   PHYSICAL EXAMINATION:  ECOG PERFORMANCE STATUS: 4 - Bedbound  Vitals:   03/09/21 1450  BP: 122/63  Pulse: 82  Resp: 18  Temp: (!) 97.3 F  (36.3 C)  SpO2: 99%   Filed Weights   03/09/21 1450  Weight: (!) 316 lb 8 oz (143.6 kg)    Exam was given in a wheelchair.   .Marland KitchenGENERAL:alert, in no acute distress and comfortable SKIN: no acute rashes, no significant lesions EYES: conjunctiva are pink and non-injected, sclera anicteric OROPHARYNX: MMM, no exudates, no oropharyngeal erythema or ulceration NECK: supple, no JVD LYMPH:  no palpable lymphadenopathy in the cervical, axillary or inguinal regions LUNGS: clear to auscultation b/l with normal respiratory effort HEART: regular rate & rhythm ABDOMEN:  normoactive bowel sounds , non tender, not distended. Extremity: no pedal edema PSYCH: alert & oriented x 3 with fluent speech NEURO: no focal motor/sensory deficits Breast - no palpable breat lumps or regional LNadenopathy noted  LABORATORY DATA:  I have reviewed the data as listed  . CBC Latest Ref Rng & Units 03/09/2021 01/26/2021 09/07/2020  WBC 4.0 - 10.5 K/uL 11.1(H) 10.7 12.6(H)  Hemoglobin 12.0 - 15.0 g/dL 8.9(L) 8.7(L) 8.5(L)  Hematocrit 36.0 - 46.0 % 29.8(L) 28.9(L) 28.6(L)  Platelets 150 - 400 K/uL 296 267 256   . CBC    Component Value Date/Time   WBC 11.1 (H) 03/09/2021 1352   WBC 10.7 01/26/2021 1405   RBC 3.33 (L) 03/09/2021 1352   HGB 8.9 (L) 03/09/2021 1352   HGB 9.3 (L) 03/02/2020 1517   HGB 8.7 (L) 03/21/2017  1351   HCT 29.8 (L) 03/09/2021 1352   HCT 29.5 (L) 03/02/2020 1517   HCT 28.9 (L) 03/21/2017 1351   PLT 296 03/09/2021 1352   PLT 254 03/02/2020 1517   MCV 89.5 03/09/2021 1352   MCV 88 03/02/2020 1517   MCV 84.3 03/21/2017 1351   MCH 26.7 03/09/2021 1352   MCHC 29.9 (L) 03/09/2021 1352   RDW 15.3 03/09/2021 1352   RDW 13.1 03/02/2020 1517   RDW 15.5 (H) 03/21/2017 1351   LYMPHSABS 2.5 03/09/2021 1352   LYMPHSABS 2.4 03/21/2017 1351   MONOABS 0.6 03/09/2021 1352   MONOABS 0.7 03/21/2017 1351   EOSABS 0.3 03/09/2021 1352   EOSABS 0.2 03/21/2017 1351   BASOSABS 0.0 03/09/2021 1352    BASOSABS 0.0 03/21/2017 1351   . CMP Latest Ref Rng & Units 03/09/2021 01/26/2021 09/07/2020  Glucose 70 - 99 mg/dL 174(H) 188(H) 302(H)  BUN 8 - 23 mg/dL 45(H) 35(H) 36(H)  Creatinine 0.44 - 1.00 mg/dL 2.13(H) 1.66(H) 1.65(H)  Sodium 135 - 145 mmol/L 139 140 140  Potassium 3.5 - 5.1 mmol/L 5.0 5.5(H) 4.1  Chloride 98 - 111 mmol/L 109 109 107  CO2 22 - 32 mmol/L 22 24 21(L)  Calcium 8.9 - 10.3 mg/dL 8.7(L) 9.1 8.4(L)  Total Protein 6.5 - 8.1 g/dL 7.8 7.2 7.2  Total Bilirubin 0.3 - 1.2 mg/dL <0.2(L) 0.3 0.4  Alkaline Phos 38 - 126 U/L 101 - 89  AST 15 - 41 U/L 10(L) 11 12(L)  ALT 0 - 44 U/L '8 9 6    ' . Lab Results  Component Value Date   IRON 41 (L) 01/26/2021   TIBC 264 01/26/2021   IRONPCTSAT 16 01/26/2021   (Iron and TIBC)  Lab Results  Component Value Date   FERRITIN 39 03/09/2021       RADIOGRAPHIC STUDIES: I have personally reviewed the radiological images as listed and agreed with the findings in the report.  No results found. Diagnostic mammogram b/l 11/01/17  IMPRESSION: No significant change in suspicious microcalcifications posterior to the lumpectomy site in the left breast compared to the mammogram of 2018. No suspicious findings in the right breast. Exclusion of some posterior tissue due to patient decreased mobility.   RECOMMENDATION: Diagnostic mammogram is suggested in 1 year. (Code:[id number]-B-01Y)  CLINICAL DATA:  Annual mammography. The patient had surgery for DCIS in the left breast in 2016. There were positive margins at surgery but the patient could not return for additional surgery. Known suspicious calcifications remain. EXAM: 2D DIGITAL DIAGNOSTIC BILATERAL MAMMOGRAM WITH CAD AND ADJUNCT TOMO COMPARISON:  Previous exam(s). ACR Breast Density Category b: There are scattered areas of fibroglandular density. FINDINGS: The suspicious calcifications posterior to the left lumpectomy site are stable. No other interval changes or other suspicious  findings. Mammographic images were processed with CAD. IMPRESSION: Continued suspicious calcifications posterior to the left lumpectomy site. No other changes. RECOMMENDATION: Continued annual mammography for surveillance. Continued oncologic follow up. I have discussed the findings and recommendations with the patient. Results were also provided in writing at the conclusion of the visit. If applicable, a reminder letter will be sent to the patient regarding the next appointment. BI-RADS CATEGORY  6: Known biopsy-proven malignancy.     Electronically Signed   By: Dorise Bullion III M.D   On: 08/16/2016 15:21    ASSESSMENT & PLAN:   [name] is a very wonderful 86 y.o. African-American female with multiple medical comorbidities as described above with   #1 Left-sided presumed  stage IA  (pT1c,pNx[cN0], cM0) invasive ductal carcinoma grade 2 out of 3, strongly ER +100%, strongly PR +100% and HER-2/neu negative. Given her high risk cardiac status lumpectomy had to be done under local anesthesia and sentinel lymph node biopsy was not possible. She has accompanying DCIS of intermediate grade present at margins. ECOG performance status is 3. Dexa scan "low bone mass" per WHO. -Bone density scan on 03/12/2017 with results showing: T-score of 0.5 at left forearm. Lumbar spine and dual femurs not completed due to patient in a wheelchair and not able to lay flat due to SOB while laying flat.   11/01/17 Mammogram stable.   #2 significant coronary artery disease with stress test in February 2016 suggesting likely multivessel disease. Has uncontrolled diabetes, hypertension, dyslipidemia, untreated sleep apnea, coronary disease, severe arthritis all of which are significantly limiting her quality of life.  #3 vitamin D deficiency - 25OH vitamin D level of 10, s/p high dose ergocalciferol re-placement with 25OH Vit D improvement to 30.9 in 10/2017. Now on vit D 2000 units daily.   #4  Anemia-  anemia of chronic disease from CKD + ? Slow GI losses . Multiple metabolic insults in the bone marrow including uncontrolled diabetes .P PLAN: -Discussed pt labwork today, 03/09/2021;   Labs today show hemoglobin of 8.9 with WBC count of 11.1k and platelets of 296k. CMP shows creatinine of 2.13.  Glucose level of 174 albumin of 3 Ferritin of 39.  -No lab or clinical evidence of Breast Cancer progression at this time. -Continue 1 mg Arimidex daily. No prohibitive toxicities at this time.  -MMG scheduled for 03/18/2021 -Continue Vitamin B12 and Ergocalciferol.  -Will offer patient option for IV Injectafer weekly x 2 doses  (goal to keep ferritin >100 in the context of CKD) -continue po iron polysaccharide -DEXA scan in 4 months -Will see back in 6 months with labs.   FOLLOW UP: Mammogram is scheduled 03/18/2021 Bone density scan in 4 months RTC with Dr Irene Limbo with labs in 6 months  . The total time spent in the appointment was 30 minutes and more than 50% was on counseling and direct patient cares.   All of the patient's questions were answered with apparent satisfaction. The patient knows to call the clinic with any problems, questions or concerns.   Sullivan Lone MD MS Hematology/Oncology Physician [facility]  .

## 2021-03-16 ENCOUNTER — Encounter: Payer: Self-pay | Admitting: Hematology

## 2021-03-16 ENCOUNTER — Encounter (HOSPITAL_COMMUNITY): Payer: Self-pay

## 2021-03-16 NOTE — Addendum Note (Signed)
Addended by: Sullivan Lone on: 03/16/2021 02:32 AM   Modules accepted: Orders, Level of Service

## 2021-03-18 ENCOUNTER — Telehealth: Payer: Self-pay

## 2021-03-18 NOTE — Telephone Encounter (Signed)
Attempted to contact pt. Left message: Message stated her iron levels are still quite low and we would recommend replacing iron IV. If patient agreeable plz schedule for IV Injectafer weekly x 2 doses.  Will send message to scheduling and asked pt to call back with questions or if she does not want iron.

## 2021-03-22 ENCOUNTER — Telehealth: Payer: Self-pay | Admitting: Hematology

## 2021-03-22 NOTE — Telephone Encounter (Signed)
Scheduled per sch msg. Called and left msg  

## 2021-03-24 ENCOUNTER — Encounter: Payer: PPO | Admitting: Student in an Organized Health Care Education/Training Program

## 2021-03-30 ENCOUNTER — Other Ambulatory Visit: Payer: Self-pay | Admitting: Hematology

## 2021-03-30 ENCOUNTER — Other Ambulatory Visit: Payer: Self-pay

## 2021-03-30 MED ORDER — CLOPIDOGREL BISULFATE 75 MG PO TABS: 75.0000 mg | ORAL_TABLET | Freq: Every day | ORAL | 0 refills | Status: DC

## 2021-04-01 MED FILL — Ferric Carboxymaltose IV Soln 750 MG/15ML (Fe Equivalent): INTRAVENOUS | Qty: 15 | Status: AC

## 2021-04-02 ENCOUNTER — Inpatient Hospital Stay: Payer: PPO

## 2021-04-02 DIAGNOSIS — M170 Bilateral primary osteoarthritis of knee: Secondary | ICD-10-CM | POA: Diagnosis not present

## 2021-04-04 ENCOUNTER — Telehealth: Payer: Self-pay | Admitting: Hematology

## 2021-04-04 ENCOUNTER — Other Ambulatory Visit: Payer: Self-pay

## 2021-04-04 MED ORDER — CLOPIDOGREL BISULFATE 75 MG PO TABS
75.0000 mg | ORAL_TABLET | Freq: Every day | ORAL | 0 refills | Status: DC
Start: 2021-04-04 — End: 2021-05-26

## 2021-04-04 NOTE — Telephone Encounter (Signed)
Sch per 11/28 los, left msg

## 2021-04-05 NOTE — Progress Notes (Signed)
Name: [name]  Age/ Sex: 86 y.o., female   MRN/ DOB: [id number], 19-Feb-1935     PCP: [name], NP   Reason for Endocrinology Evaluation: Type 2 Diabetes Mellitus     Initial Endocrinology Clinic Visit: 08/19/2018    PATIENT IDENTIFIER: [name] is a 86 y.o. female with a past medical history of T2DM, CKD III, and chronic pain syndrome . The patient has followed with Endocrinology clinic since 08/19/2018 for consultative assistance with management of her diabetes.  DIABETIC HISTORY:  [name] was diagnosed with T2DM > 40 yrs ago. Pt is a poor historian and unable to recall oral glycemic agents. She has been on insulin therapy for years. Her hemoglobin A1c has ranged from 6.7%  in 07/2017, peaking at 12.1% in 2018.  SUBJECTIVE:   During the last visit (09/29/2020): A1c 8.0 %  . We adjusted  MDI regimen.     Today (04/06/2021): [name] is here for a follow up visit on diabetes management. She is accompanied by her sister. They use the CGM at home . She does forget to take prandial insulin    Had an NSTEMI 02/2020 . S/P DES PCI   Denies nausea or diarrhea   Has been off Trulicity for a while due to cost    HOME DIABETES REGIMEN:  Toujeo 42 units daily - takes 42 units  Novolog 14 units TID  CF: BG -202/54 with meals  Trulicity 1.5 mg weekly      CONTINUOUS GLUCOSE MONITORING RECORD INTERPRETATION    Dates of Recording: 11/17-11/30/2022  Sensor description:freestyle libre  Results statistics:   CGM use % of time 49  Average and SD 158/46.3  Time in range     61   %  % Time Above 180 18  % Time above 250 16  % Time Below target 4     Glycemic patterns summary: Optimal BG's over night but with severe hyperglycemia during the day   Hyperglycemic episodes  all day   Hypoglycemic episodes occurred at night  and at times during the day after a bolus   Overnight periods: optimal with rare hypoglycemia         HISTORY:   Past Medical History:  Past Medical History:  Diagnosis Date   Allergy    takes Mucinex daily as needed   Anemia, unspecified    Anxiety    takes Clonazepam daily as needed   Arthritis    Breast cancer ([place])    Chronic diastolic CHF (congestive heart failure) (HCC)    takes Furosemide daily   CKD (chronic kidney disease), stage III ([place])    Coronary artery disease    a. s/p IMI 2004 tx with BMS to RCA;  b. s/p Promus DES to RCA 2/12 c. abnormal nuc 2016 -> cath with med rx. d. NSTEMI 02/2020  mv CAD with severe ISR in pRCA and m/dRCA lesion both treated with DES.   Dementia ([place])    Depression    takes Cymbalta daily   Diabetes mellitus    insulin daily   GERD (gastroesophageal reflux disease)    takes Protonix daily   Gout, unspecified    Headache    occasionally   History of blood clots    Hyperlipidemia    takes Pravastatin daily   Hypertension    Insomnia    Joint pain    Joint swelling    Morbid obesity ([place])  Myocardial infarction [facility]) 2004   Non-STEMI (non-ST elevated myocardial infarction) ([place]) 02/15/2020   Numbness    Obstructive sleep apnea    does not wear cpap   Osteoarthritis    Osteoarthritis    Osteoarthrosis, unspecified whether generalized or localized, lower leg    Pain, chronic    Polymyalgia rheumatica ([place])    Pulmonary emboli ([place]) 01/2012   felt to need lifelong anticoagulation but Xarelto dc in 02/2020 due to need for DAPT   Urinary incontinence    takes Linzess daily   Vertigo    hx of;was taking Meclizine if needed   Wears dentures    Past Surgical History:  Past Surgical History:  Procedure Laterality Date   ABDOMINAL HYSTERECTOMY     partial   APPENDECTOMY     blood clots/legs and lungs  2013   BREAST BIOPSY Left 07/22/2014   BREAST BIOPSY Left 02/10/2013   BREAST LUMPECTOMY Left 11/05/2014   BREAST LUMPECTOMY WITH RADIOACTIVE SEED LOCALIZATION Left 11/05/2014   Procedure: LEFT BREAST LUMPECTOMY WITH RADIOACTIVE SEED  LOCALIZATION;  Surgeon: [name], MD;  Location: [facility];  Service: General;  Laterality: Left;   CARDIAC CATHETERIZATION     COLONOSCOPY     CORONARY ANGIOPLASTY  2   CORONARY STENT INTERVENTION N/A 02/17/2020   Procedure: CORONARY STENT INTERVENTION;  Surgeon: Leonie Man, MD;  Location: [facility];  Service: Cardiovascular;  Laterality: N/A;   ESOPHAGOGASTRODUODENOSCOPY (EGD) WITH PROPOFOL N/A 11/07/2016   Procedure: ESOPHAGOGASTRODUODENOSCOPY (EGD) WITH PROPOFOL;  Surgeon: Gatha Mayer, MD;  Location: [facility];  Service: Endoscopy;  Laterality: N/A;   EXCISION OF SKIN TAG Right 11/05/2014   Procedure: EXCISION OF RIGHT EYELID SKIN TAG;  Surgeon: [name], MD;  Location: [facility];  Service: General;  Laterality: Right;   EYE SURGERY Bilateral    cataract    GASTRIC BYPASS  1977    reversed in 1979, [place] CATH AND CORONARY ANGIOGRAPHY N/A 02/17/2020   Procedure: LEFT HEART CATH AND CORONARY ANGIOGRAPHY;  Surgeon: Leonie Man, MD;  Location: [facility];  Service: Cardiovascular;  Laterality: N/A;   LEFT HEART CATHETERIZATION WITH CORONARY ANGIOGRAM N/A 06/29/2014   Procedure: LEFT HEART CATHETERIZATION WITH CORONARY ANGIOGRAM;  Surgeon: Troy Sine, MD;  Location: [facility] CATH LAB;  Service: Cardiovascular;  Laterality: N/A;   MEMBRANE PEEL Right 10/23/2018   Procedure: MEMBRANE PEEL;  Surgeon: Hurman Horn, MD;  Location: [facility];  Service: Ophthalmology;  Laterality: Right;   MI with stent placement  2004   PARS PLANA VITRECTOMY Right 10/23/2018   Procedure: PARS PLANA VITRECTOMY WITH 25 GAUGE;  Surgeon: Hurman Horn, MD;  Location: [facility];  Service: Ophthalmology;  Laterality: Right;   Social History:  reports that she has never smoked. She has never used smokeless tobacco. She reports that she does not drink alcohol and does not use drugs. Family History:  Family History  Problem Relation Age of Onset   Breast cancer Mother 70    Heart disease Mother    Throat cancer Father    Hypertension Father    Arthritis Father    Diabetes Father    Arthritis Sister    Obesity Sister    Diabetes Sister    Heart disease Cousin    Colon cancer Neg Hx    Stomach cancer Neg Hx    Esophageal cancer Neg Hx      HOME MEDICATIONS: Allergies as of 04/06/2021  Reactions   Sulfonamide Derivatives Swelling   Mouth swelling   Aricept [donepezil Hcl] Diarrhea, Nausea And Vomiting   GI upset/loose stools   Tramadol Nausea And Vomiting        Medication List        Accurate as of April 06, 2021  1:02 PM. If you have any questions, ask your nurse or doctor.          albuterol 108 (90 Base) MCG/ACT inhaler Commonly known as: ProAir HFA Inhale 1-2 puffs into the lungs every 6 (six) hours as needed for wheezing or shortness of breath.   albuterol (2.5 MG/3ML) 0.083% nebulizer solution Commonly known as: PROVENTIL Take 3 mLs (2.5 mg total) by nebulization every 6 (six) hours as needed for wheezing or shortness of breath.   anastrozole 1 MG tablet Commonly known as: ARIMIDEX TAKE 1 TABLET BY MOUTH EVERY DAY   atorvastatin 80 MG tablet Commonly known as: LIPITOR TAKE 1 TABLET BY MOUTH EVERY DAY   budesonide-formoterol 160-4.5 MCG/ACT inhaler Commonly known as: SYMBICORT Inhale 2 puffs into the lungs 2 (two) times daily.   cetirizine 10 MG tablet Commonly known as: ZYRTEC TAKE 1 TABLET BY MOUTH EVERY DAY   clopidogrel 75 MG tablet Commonly known as: PLAVIX Take 1 tablet (75 mg total) by mouth daily. Please make overdue appt with Dr. Johney Frame (new Cardiologist) before anymore refills. 3rd & final attempt   DULoxetine 60 MG capsule Commonly known as: CYMBALTA TAKE 1 CAPSULE BY MOUTH EVERY DAY FOR ANXIETY   febuxostat 40 MG tablet Commonly known as: ULORIC TAKE 1 TABLET BY MOUTH EVERY DAY   fluticasone 50 MCG/ACT nasal spray Commonly known as: FLONASE Place 1 spray into both nostrils in the  morning and at bedtime.   FreeStyle Libre 14 Day Sensor Misc USE TO INJECT 1 DEVICE INTO THE SKIN AS DIRECTED   furosemide 20 MG tablet Commonly known as: LASIX TAKE 1 TABLET BY MOUTH EVERY DAY AS NEEDED   GoodSense Aspirin Low Dose 81 MG EC tablet Generic drug: aspirin TAKE 1 TABLET BY MOUTH EVERY DAY SWALLOW WHOLE   guaiFENesin 600 MG 12 hr tablet Commonly known as: Mucinex Take 1 tablet (600 mg total) by mouth 2 (two) times daily as needed.   hydrALAZINE 50 MG tablet Commonly known as: APRESOLINE TAKE 1 TABLET BY MOUTH 2 TIMES DAILY   iron polysaccharides 150 MG capsule Commonly known as: NIFEREX Take 1 capsule (150 mg total) by mouth daily.   isosorbide mononitrate 60 MG 24 hr tablet Commonly known as: IMDUR TAKE 1 TABLET BY MOUTH EVERY DAY   linaclotide 145 MCG Caps capsule Commonly known as: LINZESS Take 1 capsule (145 mcg total) by mouth daily before breakfast.   meclizine 25 MG tablet Commonly known as: ANTIVERT TAKE 1 TABLET BY MOUTH THREE TIMES DAILY AS NEEDED FOR DIZZINESS   memantine 5 MG tablet Commonly known as: NAMENDA Take 2 by mouth in the am and 1 by mouth in the pm   metoprolol succinate 50 MG 24 hr tablet Commonly known as: TOPROL-XL Take 50 mg by mouth daily.   nitroGLYCERIN 0.4 MG SL tablet Commonly known as: Nitrostat Take one tablet under the tongue every 5 minutes as needed for chest pain   NovoLOG FlexPen 100 UNIT/ML FlexPen Generic drug: insulin aspart INJECT 16 UNITS SUBCUTANEOUSLY THREE TIMES DAILY WITH MEALS. MAX DAILY DOSE OF 70 UNITS.   nystatin cream Commonly known as: MYCOSTATIN Apply 1 application topically 2 (two) times daily.  oxyCODONE-acetaminophen 5-325 MG tablet Commonly known as: Percocet Take 1 tablet by mouth every 12 (twelve) hours as needed for severe pain. Must last 30 days.   pantoprazole 40 MG tablet Commonly known as: PROTONIX TAKE 1 TABLET BY MOUTH EVERY DAY   polyethylene glycol powder 17 GM/SCOOP  powder Commonly known as: MiraLax 17 gm mix with liquid twice daily   pregabalin 25 MG capsule Commonly known as: LYRICA Take 1 capsule (25 mg total) by mouth 2 (two) times daily.   saccharomyces boulardii 250 MG capsule Commonly known as: Florastor Take 1 capsule (250 mg total) by mouth 2 (two) times daily.   sennosides-docusate sodium 8.6-50 MG tablet Commonly known as: SENOKOT-S Take 2 tablets by mouth 2 (two) times daily.   sodium polystyrene 15 GM/60ML suspension Commonly known as: SPS TAKE 71mls BY MOUTH ONCE A WEEK TO keep potassium down   topiramate 25 MG tablet Commonly known as: TOPAMAX TAKE 1 TABLET BY MOUTH EVERY DAY   Toujeo Max SoloStar 300 UNIT/ML Solostar Pen Generic drug: insulin glargine (2 Unit Dial) Inject 42 Units into the skin daily. Patient assistance program provides   VITAMIN B-12 PO Take 1 tablet by mouth once a week.   Vitamin D (Ergocalciferol) 1.25 MG (50000 UNIT) Caps capsule Commonly known as: DRISDOL TAKE 1 CAPSULE BY MOUTH ONCE WEEKLY        PHYSICAL EXAM: VS: BP 126/74 (BP Location: Left Arm, Patient Position: Sitting, Cuff Size: Large)   Pulse 75   Ht 5\' 7"  (1.702 m)   SpO2 97%   BMI 49.57 kg/m    EXAM: General: Pt appears well and is in NAD in a wheelchair   Lungs: Clear with good BS bilat with no rales, rhonchi, or wheezes  Heart: Auscultation: RRR with normal S1 and S2 Periph. circulation: no peripheral edema  Extremities:  BL LE: No pretibial edema   Mental Status: Judgment, insight: intact Mood and affect: no depression, anxiety, or agitation   DM Foot Exam 09/29/2020   The skin of the feet is intact without sores or ulcerations. The pedal pulses are 1+ on right and 1+ on left. The sensation is undetectable  to a screening 5.07, 10 gram monofilament bilaterally   DATA REVIEWED:  Lab Results  Component Value Date   HGBA1C 8.5 (A) 04/06/2021   HGBA1C 8.0 (A) 09/29/2020   HGBA1C 9.4 (A) 06/29/2020    Latest  Reference Range & Units 03/09/21 13:52  Sodium 135 - 145 mmol/L 139  Potassium 3.5 - 5.1 mmol/L 5.0  Chloride 98 - 111 mmol/L 109  CO2 22 - 32 mmol/L 22  Glucose 70 - 99 mg/dL 174 (H)  BUN 8 - 23 mg/dL 45 (H)  Creatinine 0.44 - 1.00 mg/dL 2.13 (H)  Calcium 8.9 - 10.3 mg/dL 8.7 (L)  Anion gap 5 - 15  8  Alkaline Phosphatase 38 - 126 U/L 101  Albumin 3.5 - 5.0 g/dL 3.0 (L)  AST 15 - 41 U/L 10 (L)  ALT 0 - 44 U/L 8  Total Protein 6.5 - 8.1 g/dL 7.8  Total Bilirubin 0.3 - 1.2 mg/dL <0.2 (L)  GFR, Est Non African American >60 mL/min 22 (L)   ASSESSMENT / PLAN / RECOMMENDATIONS:   1)Type 2 Diabetes Mellitus, Sub-Optimally  Controlled, With Neuropathic, CKD III and macrovascular complications - Most recent A1c of 8.5 %. Goal A1c < 7.5 %.      - A1c fluctuates  - Main barriers to diabetes care is memory loss.  -  She has family around but no consistent presence of a single caregiver , per pt she received Novolog once a day , but her caregiver indicates she received it 2-3 times a day with meals  and I suspect this consistency is the reason for hyperglycemia during the day  - She has fasting hypoglycemia, will attempt to reduce basal insulin again  - She has not had trulicity in months due to cost, pt was provided with assistance forms    MEDICATIONS: Decrease Toujeo 42  units daily  Change  Novolog 14 units Twith Brunch and 16 units with Supper  Trulicity 1.5  mg weekly  CF : BG- 150/30  EDUCATION / INSTRUCTIONS: BG monitoring instructions: Patient is instructed to check her blood sugars 4 times a day, before meals and bedtime. Call [facility] if: BG persistently < 70  I reviewed the Rule of 15 for the treatment of hypoglycemia in detail with the patient. Literature supplied.    F/U in 6 months    Signed electronically by: Mack Guise, MD  [facility]  [facility] [address] [place], [place] [address] Phone:  [phone] FAX: [phone]   CC: [name], NP [place] [address] Phone: [phone]  Fax: [phone]  Return to Endocrinology clinic as below: Future Appointments  Date Time Provider [place]  04/08/2021  8:00 AM [facility] INFUSION [facility] None  04/16/2021 10:30 AM [facility] INFUSION [facility] None  04/18/2021  1:15 PM [name], DPM [facility]  04/25/2021  2:40 PM [facility] DIAG TOMO 1 [facility]  04/25/2021  4:15 PM [name], NP [facility] None  05/27/2021  2:15 PM [name], NP [facility] None  09/09/2021 11:30 AM [facility] LAB [facility] None  09/09/2021 12:00 PM [name], MD [facility] None

## 2021-04-06 ENCOUNTER — Ambulatory Visit (INDEPENDENT_AMBULATORY_CARE_PROVIDER_SITE_OTHER): Payer: PPO | Admitting: Internal Medicine

## 2021-04-06 ENCOUNTER — Encounter: Payer: Self-pay | Admitting: Internal Medicine

## 2021-04-06 ENCOUNTER — Other Ambulatory Visit: Payer: Self-pay

## 2021-04-06 VITALS — BP 126/74 | HR 75 | Ht 67.0 in

## 2021-04-06 DIAGNOSIS — E1122 Type 2 diabetes mellitus with diabetic chronic kidney disease: Secondary | ICD-10-CM

## 2021-04-06 DIAGNOSIS — E1165 Type 2 diabetes mellitus with hyperglycemia: Secondary | ICD-10-CM | POA: Diagnosis not present

## 2021-04-06 DIAGNOSIS — N1832 Chronic kidney disease, stage 3b: Secondary | ICD-10-CM

## 2021-04-06 DIAGNOSIS — Z794 Long term (current) use of insulin: Secondary | ICD-10-CM

## 2021-04-06 DIAGNOSIS — E1140 Type 2 diabetes mellitus with diabetic neuropathy, unspecified: Secondary | ICD-10-CM

## 2021-04-06 LAB — POCT GLYCOSYLATED HEMOGLOBIN (HGB A1C): Hemoglobin A1C: 8.5 % — AB (ref 4.0–5.6)

## 2021-04-06 NOTE — Patient Instructions (Signed)
-   Decrease  Toujeo  42 units daily  - Novolog 14 units with Brunch  and 16 units with Supper  - NOvolog 6 units with a snack  -  Trulicity 1.5  mg weekly   - Novolog correctional insulin: ADD extra units on insulin to your meal-time Novolog dose if your blood sugars are higher than 180. Use the scale below to help guide you:   Blood sugar before meal Number of units to inject  Less than 180 0 unit  181 -  210 1 units  211 -  240 2 units  241 -  270 3 units  271 -  300 4 units  301 -  330 5 units  331 -  360 6 units  361 -  390 7 units     HOW TO TREAT LOW BLOOD SUGARS (Blood sugar LESS THAN 70 MG/DL) Please follow the RULE OF 15 for the treatment of hypoglycemia treatment (when your (blood sugars are less than 70 mg/dL)   STEP 1: Take 15 grams of carbohydrates when your blood sugar is low, which includes:  3-4 GLUCOSE TABS  OR 3-4 OZ OF JUICE OR REGULAR SODA OR ONE TUBE OF GLUCOSE GEL    STEP 2: RECHECK blood sugar in 15 MINUTES STEP 3: If your blood sugar is still low at the 15 minute recheck --> then, go back to STEP 1 and treat AGAIN with another 15 grams of carbohydrates.  HOW TO TREAT LOW BLOOD SUGARS (Blood sugar LESS THAN 70 MG/DL) Please follow the RULE OF 15 for the treatment of hypoglycemia treatment (when your (blood sugars are less than 70 mg/dL)   STEP 1: Take 15 grams of carbohydrates when your blood sugar is low, which includes:  3-4 GLUCOSE TABS  OR 3-4 OZ OF JUICE OR REGULAR SODA OR ONE TUBE OF GLUCOSE GEL    STEP 2: RECHECK blood sugar in 15 MINUTES STEP 3: If your blood sugar is still low at the 15 minute recheck --> then, go back to STEP 1 and treat AGAIN with another 15 grams of carbohydrates.

## 2021-04-08 ENCOUNTER — Telehealth: Payer: Self-pay | Admitting: *Deleted

## 2021-04-08 ENCOUNTER — Other Ambulatory Visit: Payer: Self-pay

## 2021-04-08 ENCOUNTER — Inpatient Hospital Stay: Payer: PPO | Attending: Hematology

## 2021-04-08 VITALS — BP 144/91 | HR 61 | Temp 98.0°F | Resp 20

## 2021-04-08 DIAGNOSIS — C50812 Malignant neoplasm of overlapping sites of left female breast: Secondary | ICD-10-CM | POA: Diagnosis not present

## 2021-04-08 DIAGNOSIS — D509 Iron deficiency anemia, unspecified: Secondary | ICD-10-CM | POA: Diagnosis not present

## 2021-04-08 DIAGNOSIS — Z170 Estrogen receptor positive status [ER+]: Secondary | ICD-10-CM | POA: Diagnosis not present

## 2021-04-08 DIAGNOSIS — E559 Vitamin D deficiency, unspecified: Secondary | ICD-10-CM | POA: Insufficient documentation

## 2021-04-08 DIAGNOSIS — Z79899 Other long term (current) drug therapy: Secondary | ICD-10-CM | POA: Insufficient documentation

## 2021-04-08 MED ORDER — SODIUM CHLORIDE 0.9 % IV SOLN
750.0000 mg | Freq: Once | INTRAVENOUS | Status: AC
  Administered 2021-04-08: 750 mg via INTRAVENOUS
  Filled 2021-04-08: qty 15

## 2021-04-08 MED ORDER — SODIUM CHLORIDE 0.9 % IV SOLN: Freq: Once | INTRAVENOUS | Status: AC

## 2021-04-08 MED ORDER — SODIUM CHLORIDE 0.9 % IV SOLN
750.0000 mg | Freq: Once | INTRAVENOUS | Status: DC
  Filled 2021-04-08: qty 15

## 2021-04-08 MED ORDER — ACETAMINOPHEN 325 MG PO TABS
650.0000 mg | ORAL_TABLET | Freq: Once | ORAL | Status: AC
  Administered 2021-04-08: 650 mg via ORAL
  Filled 2021-04-08: qty 2

## 2021-04-08 MED ORDER — LORATADINE 10 MG PO TABS
10.0000 mg | ORAL_TABLET | Freq: Once | ORAL | Status: AC
  Administered 2021-04-08: 10 mg via ORAL
  Filled 2021-04-08: qty 1

## 2021-04-08 NOTE — Telephone Encounter (Signed)
[name] placed a Financial planner on Desk and asked it to be faxed back to [employer].   Note on order stated: "Melissa from [facility] came in and is seeing if you would put a line through "lightweight" and replace it with "extra Heavy duty" and to initial and date and fax back to her. Apparently, they only had heavy duty wheelchair's left and patient's insurance won't cover it being she got sent a "HD" one and ot lightweight as originally ordered. "  [facility] [facility] [phone] Fax: [phone]  Order faxed.

## 2021-04-08 NOTE — Patient Instructions (Signed)
Ferric Carboxymaltose Injection °What is this medication? °FERRIC CARBOXYMALTOSE (FER ik kar BOX ee MAWL tose) treats low levels of iron in your body (iron deficiency anemia). Iron is a mineral that plays an important role in making red blood cells, which carry oxygen from your lungs to the rest of your body. °This medicine may be used for other purposes; ask your health care provider or pharmacist if you have questions. °COMMON BRAND NAME(S): Injectafer °What should I tell my care team before I take this medication? °They need to know if you have any of these conditions: °High blood pressure °High levels of iron in the blood °An unusual or allergic reaction to iron, other medications, foods, dyes, or preservatives °Pregnant or trying to get pregnant °Breast-feeding °How should I use this medication? °This medication is injected into a vein. It is given by your care team in a hospital or clinic setting. °Talk to your care team about the use of this medication in children. While it may be given to children as young as 1 year for selected conditions, precautions do apply. °Overdosage: If you think you have taken too much of this medicine contact a poison control center or emergency room at once. °NOTE: This medicine is only for you. Do not share this medicine with others. °What if I miss a dose? °Keep appointments for follow-up doses. It is important not to miss your dose. Call your care team if you are unable to keep an appointment. °What may interact with this medication? °Do not take this medication with any of the following medications: °Deferoxamine °Dimercaprol °Other iron products °This list may not describe all possible interactions. Give your health care provider a list of all the medicines, herbs, non-prescription drugs, or dietary supplements you use. Also tell them if you smoke, drink alcohol, or use illegal drugs. Some items may interact with your medicine. °What should I watch for while using this  medication? °Visit your care team for regular checks on your progress. Tell your care team if your symptoms do not start to get better or if they get worse. °You may need blood work while you are taking this medication. °You may need to eat more foods that contain iron. Talk to your care team. Foods that contain iron include whole grains/cereals, dried fruits, beans, or peas, leafy green vegetables, and organ meats (liver, kidney). °What side effects may I notice from receiving this medication? °Side effects that you should report to your care team as soon as possible: °Allergic reactions--skin rash, itching, hives, swelling of the face, lips, tongue, or throat °Increase in blood pressure °Low blood pressure--dizziness, feeling faint or lightheaded, blurry vision °Shortness of breath °Side effects that usually do not require medical attention (report to your care team if they continue or are bothersome): °Flushing °Headache °Joint pain °Muscle pain °Nausea °Pain, redness, or irritation at injection site °This list may not describe all possible side effects. Call your doctor for medical advice about side effects. You may report side effects to FDA at 1-800-FDA-1088. °Where should I keep my medication? °This medication is given in a hospital or clinic. It will not be stored at home. °NOTE: This sheet is a summary. It may not cover all possible information. If you have questions about this medicine, talk to your doctor, pharmacist, or health care provider. °© 2022 Elsevier/Gold Standard (2020-09-17 00:00:00) ° °

## 2021-04-14 ENCOUNTER — Telehealth: Payer: Self-pay | Admitting: Hematology

## 2021-04-14 NOTE — Telephone Encounter (Signed)
Per sch msg, patient needs to r/s 12/10 appt. Called and spoke with patient. She would [place] to [facility] [place] [facility]. Have provider MD with this info.

## 2021-04-15 ENCOUNTER — Telehealth: Payer: Self-pay | Admitting: Hematology

## 2021-04-15 NOTE — Telephone Encounter (Signed)
Scheduled appt per sch msg. Called and left msg for patients daughter

## 2021-04-16 ENCOUNTER — Inpatient Hospital Stay: Payer: PPO

## 2021-04-18 ENCOUNTER — Ambulatory Visit: Payer: PPO | Admitting: Podiatry

## 2021-04-20 ENCOUNTER — Other Ambulatory Visit: Payer: Self-pay | Admitting: Hematology

## 2021-04-20 ENCOUNTER — Other Ambulatory Visit: Payer: Self-pay | Admitting: Nurse Practitioner

## 2021-04-20 DIAGNOSIS — G629 Polyneuropathy, unspecified: Secondary | ICD-10-CM

## 2021-04-20 DIAGNOSIS — E875 Hyperkalemia: Secondary | ICD-10-CM

## 2021-04-20 NOTE — Telephone Encounter (Signed)
Patient has request refill on medication "Aspirin", "Lyrica", and "Sodium Polystyrene". Patient last refill on medications in order are 05/12/2020, 11/03/2020, and 01/24/2021. Patient has Non Opioid Contract on file for Clonazepam dated 04/02/2019. Patient has upcoming appointment 05/27/2021. Update Contract for "Clonaze", and "Lyr" added to appointment notes. Medication pend and sent to PCP [name], NP .

## 2021-04-21 ENCOUNTER — Encounter (HOSPITAL_COMMUNITY): Payer: Self-pay

## 2021-04-21 ENCOUNTER — Other Ambulatory Visit: Payer: Self-pay | Admitting: *Deleted

## 2021-04-21 ENCOUNTER — Encounter: Payer: Self-pay | Admitting: Hematology

## 2021-04-21 DIAGNOSIS — R519 Headache, unspecified: Secondary | ICD-10-CM

## 2021-04-21 MED ORDER — TOPIRAMATE 25 MG PO TABS: 25.0000 mg | ORAL_TABLET | Freq: Every day | ORAL | 5 refills | Status: DC

## 2021-04-21 MED ORDER — FEBUXOSTAT 40 MG PO TABS: 40.0000 mg | ORAL_TABLET | Freq: Every day | ORAL | 5 refills | Status: DC

## 2021-04-21 MED ORDER — DULOXETINE HCL 60 MG PO CPEP: ORAL_CAPSULE | ORAL | 5 refills | Status: DC

## 2021-04-21 MED ORDER — ISOSORBIDE MONONITRATE ER 60 MG PO TB24: 60.0000 mg | ORAL_TABLET | Freq: Every day | ORAL | 5 refills | Status: DC

## 2021-04-21 MED ORDER — HYDRALAZINE HCL 50 MG PO TABS: 50.0000 mg | ORAL_TABLET | Freq: Two times a day (BID) | ORAL | 5 refills | Status: DC

## 2021-04-21 NOTE — Addendum Note (Signed)
Addended by: Rafael Bihari A on: 04/21/2021 10:17 AM   Modules accepted: Orders

## 2021-04-21 NOTE — Telephone Encounter (Signed)
[facility] requested refill.

## 2021-04-22 ENCOUNTER — Telehealth: Payer: Self-pay | Admitting: Internal Medicine

## 2021-04-22 NOTE — Telephone Encounter (Signed)
Patient dropped off Patient Assistance for [place].  Placed in [facility] box at the front desk.

## 2021-04-25 ENCOUNTER — Ambulatory Visit (INDEPENDENT_AMBULATORY_CARE_PROVIDER_SITE_OTHER): Payer: PPO | Admitting: Nurse Practitioner

## 2021-04-25 ENCOUNTER — Other Ambulatory Visit: Payer: Self-pay

## 2021-04-25 ENCOUNTER — Ambulatory Visit: Payer: PPO

## 2021-04-25 ENCOUNTER — Encounter: Payer: Self-pay | Admitting: Nurse Practitioner

## 2021-04-25 DIAGNOSIS — Z0000 Encounter for general adult medical examination without abnormal findings: Secondary | ICD-10-CM | POA: Diagnosis not present

## 2021-04-25 NOTE — Telephone Encounter (Signed)
Patient is aware that we don't need the [employer] application and that we just sent Blue for [employer]

## 2021-04-25 NOTE — Progress Notes (Signed)
Subjective:   [name] is a 86 y.o. female who presents for Medicare Annual (Subsequent) preventive examination.  Review of Systems    Cardiac Risk Factors include: diabetes mellitus;sedentary lifestyle;obesity (BMI >30kg/m2);advanced age (>69men, >27 women);hypertension;dyslipidemia     Objective:    Today's Vitals   04/25/21 1644  PainSc: 9    There is no height or weight on file to calculate BMI.  Advanced Directives 12/21/2020 12/16/2020 11/16/2020 06/03/2020 04/22/2020 04/12/2020 03/24/2020  Does Patient Have a Medical Advance Directive? Yes Yes Yes Yes Yes Yes Yes  Type of Advance Directive - Out of facility DNR (pink MOST or yellow form) Out of facility DNR (pink MOST or yellow form) - Out of facility DNR (pink MOST or yellow form) - Out of facility DNR (pink MOST or yellow form)  Does patient want to make changes to medical advance directive? - No - Patient declined No - Patient declined - No - Patient declined No - Patient declined No - Patient declined  Copy of Healthcare Power of Attorney in Chart? - - - - - - -  Would patient like information on creating a medical advance directive? - - - - - - -  Pre-existing out of facility DNR order (yellow form or pink MOST form) - - - - Pink MOST form placed in chart (order not valid for inpatient use) - Pink MOST form placed in chart (order not valid for inpatient use)    Current Medications (verified) Outpatient Encounter Medications as of 04/25/2021  Medication Sig   albuterol (PROAIR HFA) 108 (90 Base) MCG/ACT inhaler Inhale 1-2 puffs into the lungs every 6 (six) hours as needed for wheezing or shortness of breath.   albuterol (PROVENTIL) (2.5 MG/3ML) 0.083% nebulizer solution Take 3 mLs (2.5 mg total) by nebulization every 6 (six) hours as needed for wheezing or shortness of breath.   ASPIRIN LOW DOSE 81 MG EC tablet TAKE 1 TABLET BY MOUTH EVERY DAY   atorvastatin (LIPITOR) 80 MG tablet TAKE 1 TABLET BY MOUTH EVERY DAY    budesonide-formoterol (SYMBICORT) 160-4.5 MCG/ACT inhaler Inhale 2 puffs into the lungs 2 (two) times daily.   cetirizine (ZYRTEC) 10 MG tablet TAKE 1 TABLET BY MOUTH EVERY DAY   clopidogrel (PLAVIX) 75 MG tablet Take 1 tablet (75 mg total) by mouth daily. Please make overdue appt with Dr. Johney Frame (new Cardiologist) before anymore refills. 3rd & final attempt   Continuous Blood Gluc Sensor (FREESTYLE LIBRE 14 DAY SENSOR) MISC USE TO INJECT 1 DEVICE INTO THE SKIN AS DIRECTED   Cyanocobalamin (VITAMIN B-12 PO) Take 1 tablet by mouth once a week.    DULoxetine (CYMBALTA) 60 MG capsule Take one capsule by mouth once daily for anxiety.   febuxostat (ULORIC) 40 MG tablet Take 1 tablet (40 mg total) by mouth daily.   fluticasone (FLONASE) 50 MCG/ACT nasal spray Place 1 spray into both nostrils in the morning and at bedtime.   furosemide (LASIX) 20 MG tablet TAKE 1 TABLET BY MOUTH EVERY DAY AS NEEDED   guaiFENesin (MUCINEX) 600 MG 12 hr tablet Take 1 tablet (600 mg total) by mouth 2 (two) times daily as needed.   hydrALAZINE (APRESOLINE) 50 MG tablet Take 1 tablet (50 mg total) by mouth 2 (two) times daily.   insulin glargine, 2 Unit Dial, (TOUJEO MAX SOLOSTAR) 300 UNIT/ML Solostar Pen Inject 42 Units into the skin daily. Patient assistance program provides   iron polysaccharides (NIFEREX) 150 MG capsule Take 1 capsule (150 mg  total) by mouth daily.   isosorbide mononitrate (IMDUR) 60 MG 24 hr tablet Take 1 tablet (60 mg total) by mouth daily.   linaclotide (LINZESS) 145 MCG CAPS capsule Take 1 capsule (145 mcg total) by mouth daily before breakfast.   meclizine (ANTIVERT) 25 MG tablet TAKE 1 TABLET BY MOUTH THREE TIMES DAILY AS NEEDED FOR DIZZINESS   memantine (NAMENDA) 5 MG tablet Take 2 by mouth in the am and 1 by mouth in the pm   metoprolol succinate (TOPROL-XL) 50 MG 24 hr tablet Take 50 mg by mouth daily.   nitroGLYCERIN (NITROSTAT) 0.4 MG SL tablet Take one tablet under the tongue every 5 minutes  as needed for chest pain   NOVOLOG FLEXPEN RELION 100 UNIT/ML FlexPen INJECT 16 UNITS SUBCUTANEOUSLY THREE TIMES DAILY WITH MEALS. MAX DAILY DOSE OF 70 UNITS.   nystatin cream (MYCOSTATIN) Apply 1 application topically 2 (two) times daily.   pantoprazole (PROTONIX) 40 MG tablet TAKE 1 TABLET BY MOUTH EVERY DAY   polyethylene glycol powder (MIRALAX) 17 GM/SCOOP powder 17 gm mix with liquid twice daily   pregabalin (LYRICA) 25 MG capsule TAKE 1 CAPSULE BY MOUTH 2 TIMES DAILY   saccharomyces boulardii (FLORASTOR) 250 MG capsule Take 1 capsule (250 mg total) by mouth 2 (two) times daily.   sennosides-docusate sodium (SENOKOT-S) 8.6-50 MG tablet Take 2 tablets by mouth 2 (two) times daily.   sodium polystyrene (SPS) 15 GM/60ML suspension TAKE 68mls BY MOUTH ONCE A WEEK TO keep potassium down   topiramate (TOPAMAX) 25 MG tablet Take 1 tablet (25 mg total) by mouth daily.   Vitamin D, Ergocalciferol, (DRISDOL) 1.25 MG (50000 UNIT) CAPS capsule TAKE 1 CAPSULE BY MOUTH ONCE WEEKLY   anastrozole (ARIMIDEX) 1 MG tablet TAKE 1 TABLET BY MOUTH EVERY DAY   No facility-administered encounter medications on file as of 04/25/2021.    Allergies (verified) Sulfonamide derivatives, Aricept [donepezil hcl], and Tramadol   History: Past Medical History:  Diagnosis Date   Allergy    takes Mucinex daily as needed   Anemia, unspecified    Anxiety    takes Clonazepam daily as needed   Arthritis    Breast cancer (HCC)    Chronic diastolic CHF (congestive heart failure) (HCC)    takes Furosemide daily   CKD (chronic kidney disease), stage III ([place])    Coronary artery disease    a. s/p IMI 2004 tx with BMS to RCA;  b. s/p Promus DES to RCA 2/12 c. abnormal nuc 2016 -> cath with med rx. d. NSTEMI 02/2020  mv CAD with severe ISR in pRCA and m/dRCA lesion both treated with DES.   Dementia ([place])    Depression    takes Cymbalta daily   Diabetes mellitus    insulin daily   GERD (gastroesophageal reflux disease)     takes Protonix daily   Gout, unspecified    Headache    occasionally   History of blood clots    Hyperlipidemia    takes Pravastatin daily   Hypertension    Insomnia    Joint pain    Joint swelling    Morbid obesity ([place])    Myocardial infarction ([place]) 2004   Non-STEMI (non-ST elevated myocardial infarction) ([place]) 02/15/2020   Numbness    Obstructive sleep apnea    does not wear cpap   Osteoarthritis    Osteoarthritis    Osteoarthrosis, unspecified whether generalized or localized, lower leg    Pain, chronic    Polymyalgia rheumatica (  [place])    Pulmonary emboli ([place]) 01/2012   felt to need lifelong anticoagulation but Xarelto dc in 02/2020 due to need for DAPT   Urinary incontinence    takes Linzess daily   Vertigo    hx of;was taking Meclizine if needed   Wears dentures    Past Surgical History:  Procedure Laterality Date   ABDOMINAL HYSTERECTOMY     partial   APPENDECTOMY     blood clots/legs and lungs  2013   BREAST BIOPSY Left 07/22/2014   BREAST BIOPSY Left 02/10/2013   BREAST LUMPECTOMY Left 11/05/2014   BREAST LUMPECTOMY WITH RADIOACTIVE SEED LOCALIZATION Left 11/05/2014   Procedure: LEFT BREAST LUMPECTOMY WITH RADIOACTIVE SEED LOCALIZATION;  Surgeon: [name], MD;  Location: [facility];  Service: General;  Laterality: Left;   CARDIAC CATHETERIZATION     COLONOSCOPY     CORONARY ANGIOPLASTY  2   CORONARY STENT INTERVENTION N/A 02/17/2020   Procedure: CORONARY STENT INTERVENTION;  Surgeon: Leonie Man, MD;  Location: [facility] [place] [facility];  Service: Cardiovascular;  Laterality: N/A;   ESOPHAGOGASTRODUODENOSCOPY (EGD) WITH PROPOFOL N/A 11/07/2016   Procedure: ESOPHAGOGASTRODUODENOSCOPY (EGD) WITH PROPOFOL;  Surgeon: Gatha Mayer, MD;  Location: [facility];  Service: Endoscopy;  Laterality: N/A;   EXCISION OF SKIN TAG Right 11/05/2014   Procedure: EXCISION OF RIGHT EYELID SKIN TAG;  Surgeon: [name], MD;  Location: [place];  Service: General;   Laterality: Right;   EYE SURGERY Bilateral    cataract    GASTRIC BYPASS  1977    reversed in 1979, [place] CATH AND CORONARY ANGIOGRAPHY N/A 02/17/2020   Procedure: LEFT HEART CATH AND CORONARY ANGIOGRAPHY;  Surgeon: Leonie Man, MD;  Location: [place] [facility];  Service: Cardiovascular;  Laterality: N/A;   LEFT HEART CATHETERIZATION WITH CORONARY ANGIOGRAM N/A 06/29/2014   Procedure: LEFT HEART CATHETERIZATION WITH CORONARY ANGIOGRAM;  Surgeon: Troy Sine, MD;  Location[facility];  Service: Cardiovascular;  Laterality: N/A;   MEMBRANE PEEL Right 10/23/2018   Procedure: MEMBRANE PEEL;  Surgeon: Hurman Horn, MD;  Location: [place];  Service: Ophthalmology;  Laterality: Right;   MI with stent placement  2004   PARS PLANA VITRECTOMY Right 10/23/2018   Procedure: PARS PLANA VITRECTOMY WITH 25 GAUGE;  Surgeon: Hurman Horn, MD;  Location: [place];  Service: Ophthalmology;  Laterality: Right;   Family History  Problem Relation Age of Onset   Breast cancer Mother 60   Heart disease Mother    Throat cancer Father    Hypertension Father    Arthritis Father    Diabetes Father    Arthritis Sister    Obesity Sister    Diabetes Sister    Heart disease Cousin    Colon cancer Neg Hx    Stomach cancer Neg Hx    Esophageal cancer Neg Hx    Social History   Socioeconomic History   Marital status: Widowed    Spouse name: Not on file   Number of children: Not on file   Years of education: Not on file   Highest education level: Not on file  Occupational History   Occupation: retired  Tobacco Use   Smoking status: Never   Smokeless tobacco: Never  Vaping Use   Vaping Use: Never used  Substance and Sexual Activity   Alcohol use: No    Alcohol/week: 0.0 standard drinks   Drug use: No   Sexual activity: Not Currently  Other Topics Concern   Not on file  Social History Narrative   Not on file   Social Determinants of Health   Financial Resource Strain:  Not on file  Food Insecurity: Not on file  Transportation Needs: Not on file  Physical Activity: Not on file  Stress: Not on file  Social Connections: Not on file    Tobacco Counseling Counseling given: Not Answered   Clinical Intake:  Pre-visit preparation completed: Yes  Pain Score: 9  Pain Type: Chronic pain Pain Location: Hand Pain Orientation: Right Pain Descriptors / Indicators: Aching Pain Frequency: Constant     BMI - recorded: 49 Diabetes: Yes  How often do you need to have someone help you when you read instructions, pamphlets, or other written materials from your doctor or pharmacy?: 5 - Always  Diabetic?yes         Activities of Daily Living In your present state of health, do you have any difficulty performing the following activities: 04/25/2021  Hearing? Y  Vision? Y  Difficulty concentrating or making decisions? Y  Walking or climbing stairs? Y  Dressing or bathing? Y  Doing errands, shopping? Y  Preparing Food and eating ? Y  Using the Toilet? Y  In the past six months, have you accidently leaked urine? Y  Do you have problems with loss of bowel control? Y  Managing your Medications? Y  Managing your Finances? Y  Housekeeping or managing your Housekeeping? Y  Some recent data might be hidden    Patient Care Team: [name], NP as PCP - General (Geriatric Medicine) [name], MD (Inactive) as PCP - Cardiology (Cardiology) [name], [name], MD (Inactive) as Consulting Physician (Cardiology) [name], MD as Consulting Physician (Ophthalmology) [name], MD as Consulting Physician (General Surgery) [name], MD (Inactive) as Referring Physician (Obstetrics and Gynecology) [name] [name], MD as Consulting Physician (Ophthalmology) [facility], [name], MD as Consulting Physician (Endocrinology) [name], MD as Consulting Physician (Hematology) [name], DO as Consulting Physician  (Neurology)  Indicate any recent Medical Services you may have received from other than [facility] providers in the past year (date may be approximate).     Assessment:   This is a routine wellness examination for Tyla.  Hearing/Vision screen No results found.  Dietary issues and exercise activities discussed: Current Exercise Habits: The patient does not participate in regular exercise at present, Exercise limited by: orthopedic condition(s)   Goals Addressed   None    Depression Screen PHQ 2/9 Scores 04/25/2021 04/14/2019 10/11/2018 04/12/2018 01/14/2018 10/25/2017 08/17/2017  PHQ - 2 Score 3 3 6 6 3 3  -  PHQ- 9 Score 18 15 19 17 9 7  -  Exception Documentation - - - - - - Other- indicate reason in comment box  Not completed - - - - - - spoke with daughter, did not speak with patient    Fall Risk Fall Risk  04/25/2021 03/14/2021 12/21/2020 12/16/2020 11/16/2020  Falls in the past year? 0 0 0 0 0  Number falls in past yr: 0 0 - 0 0  Comment - - - - -  Injury with Fall? 0 0 - 0 0  Comment - - - - -  Risk Factor Category  - - - - -  Risk for fall due to : No Fall Risks History of fall(s);No Fall Risks - No Fall Risks No Fall Risks  Follow up Falls evaluation completed Falls evaluation completed;Education provided;Falls prevention discussed -  Falls evaluation completed Falls evaluation completed    FALL RISK PREVENTION PERTAINING TO THE HOME:  Any stairs in or around the home? Yes  If so, are there any without handrails? No  Home free of loose throw rugs in walkways, pet beds, electrical cords, etc? Yes  Adequate lighting in your home to reduce risk of falls? Yes   ASSISTIVE DEVICES UTILIZED TO PREVENT FALLS:  Life alert? No  Use of a cane, walker or w/c? Yes  Grab bars in the bathroom? Yes  Shower chair or bench in shower? Yes  Elevated toilet seat or a handicapped toilet? Yes   TIMED UP AND GO:  Was the test performed? No .    Cognitive Function: MMSE - Mini Mental State  Exam 04/12/2018 08/02/2016 08/02/2016 07/27/2016 03/24/2016  Orientation to time 4 4 4 4 5   Orientation to Place 5 5 5 5 5   Registration 3 3 3 3 3   Attention/ Calculation 5 5 5 5  0  Recall 1 1 1 1 2   Language- name 2 objects 2 2 2 2 2   Language- repeat 1 1 1 1 1   Language- follow 3 step command 3 2 2 1 3   Language- read & follow direction 1 1 1 1 1   Write a sentence 1 1 1 1 1   Copy design 0 1 1 1 1   Total score 26 26 26 25 24      6CIT Screen 04/25/2021 04/22/2020 04/14/2019  What Year? 4 points 0 points 0 points  What month? 0 points 0 points 0 points  What time? 0 points 0 points 0 points  Count back from 20 2 points 0 points 4 points  Months in reverse 4 points 4 points 4 points  Repeat phrase 2 points 6 points 8 points  Total Score 12 10 16     Immunizations Immunization History  Administered Date(s) Administered   Fluad Quad(high Dose 65+) 02/12/2019, 02/18/2020, 01/26/2021   Influenza Whole 03/14/2007, 02/13/2008, 01/18/2010   Influenza, High Dose Seasonal PF 01/16/2017, 03/11/2018   Influenza,inj,Quad PF,6+ Mos 01/08/2013, 02/18/2014, 01/18/2015, 01/24/2016   Influenza-Unspecified 02/15/2012, 02/18/2014   PFIZER(Purple Top)SARS-COV-2 Vaccination 07/06/2019, 08/05/2019, 12/30/2019   Pneumococcal Conjugate-13 02/27/2017   Pneumococcal Polysaccharide-23 05/08/2002   Td 05/08/2008    TDAP status: Due, Education has been provided regarding the importance of this vaccine. Advised may receive this vaccine at local pharmacy or Health Dept. Aware to provide a copy of the vaccination record if obtained from local pharmacy or Health Dept. Verbalized acceptance and understanding.  Flu Vaccine status: Up to date  Pneumococcal vaccine status: Up to date  Covid-19 vaccine status: Information provided on how to obtain vaccines.   Qualifies for Shingles Vaccine? Yes   Zostavax completed No   Shingrix Completed?: No.    Education has been provided regarding the importance of this  vaccine. Patient has been advised to call insurance company to determine out of pocket expense if they have not yet received this vaccine. Advised may also receive vaccine at local pharmacy or Health Dept. Verbalized acceptance and understanding.  Screening Tests Health Maintenance  Topic Date Due   Zoster Vaccines- Shingrix (1 of 2) Never done   TETANUS/TDAP  05/08/2018   COVID-19 Vaccine (4 - Booster for Pfizer series) 02/24/2020   OPHTHALMOLOGY EXAM  08/26/2020   MAMMOGRAM  02/19/2021   FOOT EXAM  02/25/2021   HEMOGLOBIN A1C  10/04/2021   Pneumonia Vaccine 73+ Years old  Completed   INFLUENZA VACCINE  Completed  DEXA SCAN  Completed   HPV VACCINES  Aged Out    Health Maintenance  Health Maintenance Due  Topic Date Due   Zoster Vaccines- Shingrix (1 of 2) Never done   TETANUS/TDAP  05/08/2018   COVID-19 Vaccine (4 - Booster for Pfizer series) 02/24/2020   OPHTHALMOLOGY EXAM  08/26/2020   MAMMOGRAM  02/19/2021   FOOT EXAM  02/25/2021    Colorectal cancer screening: No longer required.   Mammogram status: No longer required due to AGEd out.  Bone Density status: Ordered on 03/16/21. Pt provided with contact info and advised to call to schedule appt.  Lung Cancer Screening: (Low Dose CT Chest recommended if Age 9-80 years, 30 pack-year currently smoking OR have quit w/in 15years.) does not qualify.   Lung Cancer Screening Referral: na  Additional Screening:  Hepatitis C Screening: does not qualify  Vision Screening: Recommended annual ophthalmology exams for early detection of glaucoma and other disorders of the eye. Is the patient up to date with their annual eye exam?  No  Who is the provider or what is the name of the office in which the patient attends annual eye exams? [facility] If pt is not established with a provider, would they like to be referred to a provider to establish care? No .   Dental Screening: Recommended annual dental exams for proper oral  hygiene  Community Resource Referral / Chronic Care Management: CRR required this visit?  No   CCM required this visit?  No      Plan:     I have personally reviewed and noted the following in the patients chart:   Medical and social history Use of alcohol, tobacco or illicit drugs  Current medications and supplements including opioid prescriptions.  Functional ability and status Nutritional status Physical activity Advanced directives List of other physicians Hospitalizations, surgeries, and ER visits in previous 12 months Vitals Screenings to include cognitive, depression, and falls Referrals and appointments  In addition, I have reviewed and discussed with patient certain preventive protocols, quality metrics, and best practice recommendations. A written personalized care plan for preventive services as well as general preventive health recommendations were provided to patient.     [name], NP   04/25/2021    Virtual Visit via Telephone Note  I connected withNAME@ on 04/25/21 at  4:15 PM EST by telephone and verified that I am speaking with the correct person using two identifiers.  Location: Patient: home Provider: [place]   I discussed the limitations, risks, security and privacy concerns of performing an evaluation and management service by telephone and the availability of in person appointments. I also discussed with the patient that there may be a patient responsible charge related to this service. The patient expressed understanding and agreed to proceed.   I discussed the assessment and treatment plan with the patient. The patient was provided an opportunity to ask questions and all were answered. The patient agreed with the plan and demonstrated an understanding of the instructions.   The patient was advised to call back or seek an in-person evaluation if the symptoms worsen or if the condition fails to improve as anticipated.  I provided 14 minutes  of non-face-to-face time during this encounter.  Carlos American. Harle Battiest Avs printed and mailed

## 2021-04-25 NOTE — Progress Notes (Signed)
° °  This service is provided via telemedicine  No vital signs collected/recorded due to the encounter was a telemedicine visit.   Location of patient (ex: home, work):  Home  Patient consents to a telephone visit: Yes, see telephone visit dated 04/25/21  Location of the provider (ex: office, home):  [facility], Office   Name of any referring provider:  N/A  Names of all persons participating in the telemedicine service and their role in the encounter:  S.Chrae B/CMA, [name], NP, [name] (daughter) and Patient   Time spent on call:  9 min with medical assistant

## 2021-04-25 NOTE — Telephone Encounter (Signed)
Patient assistance application for Trulicity was faxed and receipt confirmed at 11:31am.

## 2021-04-25 NOTE — Patient Instructions (Signed)
[name] , Thank you for taking time to come for your Medicare Wellness Visit. I appreciate your ongoing commitment to your health goals. Please review the following plan we discussed and let me know if I can assist you in the future.   Screening recommendations/referrals: Colonoscopy aged out Mammogram aged out Bone Density ordered Recommended yearly ophthalmology/optometry visit for glaucoma screening and checkup Recommended yearly dental visit for hygiene and checkup  Vaccinations: Influenza vaccine up to date Pneumococcal vaccine up to date Tdap vaccine RECOMMENDED- to get at local pharmacy Shingles vaccine RECOMMENDED- to get at local pharmacy   COVID booster- recommended to get at local pharmacy   Advanced directives: MOST form on file  Conditions/risks identified: complications related to diabetes, advance age, obesity  Next appointment: yearly for AWV   Preventive Care 84 Years and Older, Female Preventive care refers to lifestyle choices and visits with your health care provider that can promote health and wellness. What does preventive care include? A yearly physical exam. This is also called an annual well check. Dental exams once or twice a year. Routine eye exams. Ask your health care provider how often you should have your eyes checked. Personal lifestyle choices, including: Daily care of your teeth and gums. Regular physical activity. Eating a healthy diet. Avoiding tobacco and drug use. Limiting alcohol use. Practicing safe sex. Taking low-dose aspirin every day. Taking vitamin and mineral supplements as recommended by your health care provider. What happens during an annual well check? The services and screenings done by your health care provider during your annual well check will depend on your age, overall health, lifestyle risk factors, and family history of disease. Counseling  Your health care provider may ask you questions about your: Alcohol  use. Tobacco use. Drug use. Emotional well-being. Home and relationship well-being. Sexual activity. Eating habits. History of falls. Memory and ability to understand (cognition). Work and work Statistician. Reproductive health. Screening  You may have the following tests or measurements: Height, weight, and BMI. Blood pressure. Lipid and cholesterol levels. These may be checked every 5 years, or more frequently if you are over 71 years old. Skin check. Lung cancer screening. You may have this screening every year starting at age 45 if you have a 30-pack-year history of smoking and currently smoke or have quit within the past 15 years. Fecal occult blood test (FOBT) of the stool. You may have this test every year starting at age 44. Flexible sigmoidoscopy or colonoscopy. You may have a sigmoidoscopy every 5 years or a colonoscopy every 10 years starting at age 45. Hepatitis C blood test. Hepatitis B blood test. Sexually transmitted disease (STD) testing. Diabetes screening. This is done by checking your blood sugar (glucose) after you have not eaten for a while (fasting). You may have this done every 1-3 years. Bone density scan. This is done to screen for osteoporosis. You may have this done starting at age 84. Mammogram. This may be done every 1-2 years. Talk to your health care provider about how often you should have regular mammograms. Talk with your health care provider about your test results, treatment options, and if necessary, the need for more tests. Vaccines  Your health care provider may recommend certain vaccines, such as: Influenza vaccine. This is recommended every year. Tetanus, diphtheria, and acellular pertussis (Tdap, Td) vaccine. You may need a Td booster every 10 years. Zoster vaccine. You may need this after age 17. Pneumococcal 13-valent conjugate (PCV13) vaccine. One dose is recommended after  age 84. Pneumococcal polysaccharide (PPSV23) vaccine. One dose is  recommended after age 71. Talk to your health care provider about which screenings and vaccines you need and how often you need them. This information is not intended to replace advice given to you by your health care provider. Make sure you discuss any questions you have with your health care provider. Document Released: 05/21/2015 Document Revised: 01/12/2016 Document Reviewed: 02/23/2015 Elsevier Interactive Patient Education  2017 [employer] Prevention in the Home Falls can cause injuries. They can happen to people of all ages. There are many things you can do to make your home safe and to help prevent falls. What can I do on the outside of my home? Regularly fix the edges of walkways and driveways and fix any cracks. Remove anything that might make you trip as you walk through a door, such as a raised step or threshold. Trim any bushes or trees on the path to your home. Use bright outdoor lighting. Clear any walking paths of anything that might make someone trip, such as rocks or tools. Regularly check to see if handrails are loose or broken. Make sure that both sides of any steps have handrails. Any raised decks and porches should have guardrails on the edges. Have any leaves, snow, or ice cleared regularly. Use sand or salt on walking paths during winter. Clean up any spills in your garage right away. This includes oil or grease spills. What can I do in the bathroom? Use night lights. Install grab bars by the toilet and in the tub and shower. Do not use towel bars as grab bars. Use non-skid mats or decals in the tub or shower. If you need to sit down in the shower, use a plastic, non-slip stool. Keep the floor dry. Clean up any water that spills on the floor as soon as it happens. Remove soap buildup in the tub or shower regularly. Attach bath mats securely with double-sided non-slip rug tape. Do not have throw rugs and other things on the floor that can make you  trip. What can I do in the bedroom? Use night lights. Make sure that you have a light by your bed that is easy to reach. Do not use any sheets or blankets that are too big for your bed. They should not hang down onto the floor. Have a firm chair that has side arms. You can use this for support while you get dressed. Do not have throw rugs and other things on the floor that can make you trip. What can I do in the kitchen? Clean up any spills right away. Avoid walking on wet floors. Keep items that you use a lot in easy-to-reach places. If you need to reach something above you, use a strong step stool that has a grab bar. Keep electrical cords out of the way. Do not use floor polish or wax that makes floors slippery. If you must use wax, use non-skid floor wax. Do not have throw rugs and other things on the floor that can make you trip. What can I do with my stairs? Do not leave any items on the stairs. Make sure that there are handrails on both sides of the stairs and use them. Fix handrails that are broken or loose. Make sure that handrails are as long as the stairways. Check any carpeting to make sure that it is firmly attached to the stairs. Fix any carpet that is loose or worn. Avoid having throw rugs  at the top or bottom of the stairs. If you do have throw rugs, attach them to the floor with carpet tape. Make sure that you have a light switch at the top of the stairs and the bottom of the stairs. If you do not have them, ask someone to add them for you. What else can I do to help prevent falls? Wear shoes that: Do not have high heels. Have rubber bottoms. Are comfortable and fit you well. Are closed at the toe. Do not wear sandals. If you use a stepladder: Make sure that it is fully opened. Do not climb a closed stepladder. Make sure that both sides of the stepladder are locked into place. Ask someone to hold it for you, if possible. Clearly mark and make sure that you can  see: Any grab bars or handrails. First and last steps. Where the edge of each step is. Use tools that help you move around (mobility aids) if they are needed. These include: Canes. Walkers. Scooters. Crutches. Turn on the lights when you go into a dark area. Replace any light bulbs as soon as they burn out. Set up your furniture so you have a clear path. Avoid moving your furniture around. If any of your floors are uneven, fix them. If there are any pets around you, be aware of where they are. Review your medicines with your doctor. Some medicines can make you feel dizzy. This can increase your chance of falling. Ask your doctor what other things that you can do to help prevent falls. This information is not intended to replace advice given to you by your health care provider. Make sure you discuss any questions you have with your health care provider. Document Released: 02/18/2009 Document Revised: 09/30/2015 Document Reviewed: 05/29/2014 Elsevier Interactive Patient Education  2017 [employer].

## 2021-04-27 ENCOUNTER — Other Ambulatory Visit: Payer: Self-pay | Admitting: Internal Medicine

## 2021-04-27 ENCOUNTER — Other Ambulatory Visit: Payer: Self-pay

## 2021-04-27 ENCOUNTER — Ambulatory Visit
Admission: RE | Admit: 2021-04-27 | Discharge: 2021-04-27 | Disposition: A | Discharge status: Home / Self Care | Payer: PPO | Attending: Student in an Organized Health Care Education/Training Program | Admitting: Student in an Organized Health Care Education/Training Program

## 2021-04-27 ENCOUNTER — Ambulatory Visit
Admission: RE | Admit: 2021-04-27 | Discharge: 2021-04-27 | Disposition: A | Discharge status: Home / Self Care | Payer: PPO | Source: Ambulatory Visit | Attending: Student in an Organized Health Care Education/Training Program | Admitting: Student in an Organized Health Care Education/Training Program

## 2021-04-27 ENCOUNTER — Encounter: Payer: Self-pay | Admitting: Student in an Organized Health Care Education/Training Program

## 2021-04-27 ENCOUNTER — Other Ambulatory Visit: Payer: Self-pay | Admitting: Hematology

## 2021-04-27 ENCOUNTER — Ambulatory Visit: Payer: PPO | Admitting: Student in an Organized Health Care Education/Training Program

## 2021-04-27 VITALS — BP 130/98 | HR 77 | Temp 97.0°F | Resp 16 | Ht 67.0 in | Wt 316.0 lb

## 2021-04-27 DIAGNOSIS — G894 Chronic pain syndrome: Secondary | ICD-10-CM | POA: Insufficient documentation

## 2021-04-27 DIAGNOSIS — G8929 Other chronic pain: Secondary | ICD-10-CM | POA: Insufficient documentation

## 2021-04-27 DIAGNOSIS — M25521 Pain in right elbow: Secondary | ICD-10-CM | POA: Insufficient documentation

## 2021-04-27 DIAGNOSIS — M79641 Pain in right hand: Secondary | ICD-10-CM

## 2021-04-27 DIAGNOSIS — M5416 Radiculopathy, lumbar region: Secondary | ICD-10-CM

## 2021-04-27 DIAGNOSIS — M47816 Spondylosis without myelopathy or radiculopathy, lumbar region: Secondary | ICD-10-CM | POA: Insufficient documentation

## 2021-04-27 DIAGNOSIS — E1140 Type 2 diabetes mellitus with diabetic neuropathy, unspecified: Secondary | ICD-10-CM

## 2021-04-27 MED ORDER — OXYCODONE HCL 5 MG PO TABS: 5.0000 mg | ORAL_TABLET | Freq: Two times a day (BID) | ORAL | 0 refills | Status: AC | PRN

## 2021-04-27 MED ORDER — OXYCODONE HCL 5 MG PO TABS: 5.0000 mg | ORAL_TABLET | Freq: Two times a day (BID) | ORAL | 0 refills | Status: DC | PRN

## 2021-04-27 NOTE — Progress Notes (Signed)
Nursing Pain Medication Assessment:  Safety precautions to be maintained throughout the outpatient stay will include: orient to surroundings, keep bed in low position, maintain call bell within reach at all times, provide assistance with transfer out of bed and ambulation.  Medication Inspection Compliance: Pill count conducted under aseptic conditions, in front of the patient. Neither the pills nor the bottle was removed from the patient's sight at any time. Once count was completed pills were immediately returned to the patient in their original bottle.  Medication: See above Pill/Patch Count:  19 of 60 pills remain Pill/Patch Appearance: Markings consistent with prescribed medication Bottle Appearance: Standard pharmacy container. Clearly labeled. Filled Date: 59 / 01 / 2022 Last Medication intake:  TodaySafety precautions to be maintained throughout the outpatient stay will include: orient to surroundings, keep bed in low position, maintain call bell within reach at all times, provide assistance with transfer out of bed and ambulation.

## 2021-04-27 NOTE — Patient Instructions (Addendum)
Supplement with Tylenol Arthritis 500 mg every 8 hours as needed Refill of Oxycodone Xrays of right hand

## 2021-04-27 NOTE — Progress Notes (Signed)
PROVIDER NOTE: Information contained herein reflects review and annotations entered in association with encounter. Interpretation of such information and data should be left to medically-trained personnel. Information provided to patient can be located elsewhere in the medical record under "Patient Instructions". Document created using STT-dictation technology, any transcriptional errors that may result from process are unintentional.    Patient: [name]  Service Category: E/M  Provider: Gillis Santa, MD  DOB: 18-Aug-1934  DOS: 04/27/2021  Specialty: Interventional Pain Management  MRN: [id number]  Setting: Ambulatory outpatient  PCP: [name], NP  Type: Established Patient    Referring Provider: [name], NP  Location: Office  Delivery: Face-to-face     HPI  [name], a 86 y.o. year old female, is here today because of her Chronic pain of right hand [M79.641, G89.29]. Ms. Browder primary complain today is Hand Burn and Hand Pain (Both, right worse)  Last encounter: My last encounter with her was on 12/21/20  Pertinent problems: Ms. Hehn has Severe episode of recurrent major depressive disorder, without psychotic features ([place]); Chronic radicular lumbar pain; Lumbar spondylosis; Spinal stenosis, lumbar region, with neurogenic claudication; Bilateral primary osteoarthritis of knee; and Pain management contract signed on their pertinent problem list. Pain Assessment: Severity of Chronic pain is reported as a 7  (left shoulder)/10. Location: Hand (both) Right, Left/ . Onset:  . Quality: Aching. Timing: Constant. Modifying factor(s):  [name] Vitals:  height is _0  (1.702 m) and weight is 316 lb (143.3 kg) (abnormal). Her temperature is 97 F (36.1 C) (abnormal). Her blood pressure is 130/98 (abnormal) and her pulse is 77. Her respiration is 16 and oxygen saturation is 97%.   Reason for encounter: medication management.    No change in medical history since last visit.   Patient's pain is at baseline.  Patient continues multimodal pain regimen as prescribed.  States that it provides pain relief and improvement in functional status. Is utilizing Colace which she states is helpful Increased right hand pain, states that this is sharp and very debilitating at times  Pharmacotherapy Assessment    Analgesic: Percocet 5 mg twice daily as needed, quantity 60/month  Monitoring: [place] PMP: PDMP reviewed during this encounter.       Pharmacotherapy: No side-effects or adverse reactions reported. Compliance: No problems identified. Effectiveness: Clinically acceptable.  [name], RN  04/27/2021  1:45 PM  Sign when Signing Visit Nursing Pain Medication Assessment:  Safety precautions to be maintained throughout the outpatient stay will include: orient to surroundings, keep bed in low position, maintain call bell within reach at all times, provide assistance with transfer out of bed and ambulation.  Medication Inspection Compliance: Pill count conducted under aseptic conditions, in front of the patient. Neither the pills nor the bottle was removed from the patient's sight at any time. Once count was completed pills were immediately returned to the patient in their original bottle.  Medication: See above Pill/Patch Count:  19 of 60 pills remain Pill/Patch Appearance: Markings consistent with prescribed medication Bottle Appearance: Standard pharmacy container. Clearly labeled. Filled Date: 20 / 01 / 2022 Last Medication intake:  TodaySafety precautions to be maintained throughout the outpatient stay will include: orient to surroundings, keep bed in low position, maintain call bell within reach at all times, provide assistance with transfer out of bed and ambulation.      UDS: : Final result   Visible to patient: Yes (not seen)   Next appt: 10/26/2020 at 01:45 PM  in Podiatry [name], DPM)   Dx: Chronic pain syndrome   0 Result Notes   Ref Range & Units  6 mo ago  Amphetamines, IA Cutoff:50 ng/mL Negative   Barbiturates, IA Cutoff:0.1 ug/mL Negative   Benzodiazepines, IA Cutoff:20 ng/mL Negative   Cocaine & Metabolite, IA Cutoff:25 ng/mL Negative   Phencyclidine, IA Cutoff:8 ng/mL Negative   THC(Marijuana) Metabolite, IA Cutoff:5 ng/mL Negative   Opiates, IA Cutoff:5 ng/mL Negative   Oxycodones, IA Cutoff:5 ng/mL Negative   Comment: Presumptive immunoassay result indicated need for further  testing; definitive confirmation was negative.   Methadone, IA Cutoff:25 ng/mL Negative   Propoxyphene, IA Cutoff:50 ng/mL Negative   Comment: This test was developed and its performance characteristics  determined by [facility].  It has not been cleared or approved  by the Food and Drug Administration.         ROS  Constitutional: Denies any fever or chills Gastrointestinal: No reported hemesis, hematochezia, vomiting, or acute GI distress Musculoskeletal:  left shoulder pain, right hand pain Neurological: No reported episodes of acute onset apraxia, aphasia, dysarthria, agnosia, amnesia, paralysis, loss of coordination, or loss of consciousness  Medication Review  Cyanocobalamin, DULoxetine, FreeStyle Libre 14 Day Sensor, Vitamin D (Ergocalciferol), albuterol, anastrozole, aspirin, atorvastatin, budesonide-formoterol, cetirizine, clopidogrel, febuxostat, fluticasone, furosemide, guaiFENesin, hydrALAZINE, insulin aspart, insulin glargine (2 Unit Dial), iron polysaccharides, isosorbide mononitrate, linaclotide, meclizine, memantine, metoprolol succinate, nitroGLYCERIN, nystatin cream, oxyCODONE, pantoprazole, polyethylene glycol powder, pregabalin, saccharomyces boulardii, sennosides-docusate sodium, sodium polystyrene, and topiramate  History Review  Allergy: Ms. Pogosyan is allergic to sulfonamide derivatives, aricept [donepezil hcl], and tramadol. Drug: Ms. Glymph  reports no history of drug use. Alcohol:  reports no history of alcohol use. Tobacco:   reports that she has never smoked. She has never used smokeless tobacco. Social: Ms. Sawatzky  reports that she has never smoked. She has never used smokeless tobacco. She reports that she does not drink alcohol and does not use drugs. Medical:  has a past medical history of Allergy, Anemia, unspecified, Anxiety, Arthritis, Breast cancer ([place]), Chronic diastolic CHF (congestive heart failure) ([place]), CKD (chronic kidney disease), stage III ([place]), Coronary artery disease, Dementia ([place]), Depression, Diabetes mellitus, GERD (gastroesophageal reflux disease), Gout, unspecified, Headache, History of blood clots, Hyperlipidemia, Hypertension, Insomnia, Joint pain, Joint swelling, Morbid obesity ([place]), Myocardial infarction ([place]) (2004), Non-STEMI (non-ST elevated myocardial infarction) ([place]) (02/15/2020), Numbness, Obstructive sleep apnea, Osteoarthritis, Osteoarthritis, Osteoarthrosis, unspecified whether generalized or localized, lower leg, Pain, chronic, Polymyalgia rheumatica ([place]), Pulmonary emboli ([place]) (01/2012), Urinary incontinence, Vertigo, and Wears dentures. Surgical: Ms. Entwistle  has a past surgical history that includes Appendectomy; Cardiac catheterization; Gastric bypass (1977 ); blood clots/legs and lungs (2013); MI with stent placement (2004); left heart catheterization with coronary angiogram (N/A, 06/29/2014); Coronary angioplasty (2); Eye surgery (Bilateral); Colonoscopy; Abdominal hysterectomy; Breast lumpectomy with radioactive seed localization (Left, 11/05/2014); Excision of skin tag (Right, 11/05/2014); Breast lumpectomy (Left, 11/05/2014); Breast biopsy (Left, 07/22/2014); Breast biopsy (Left, 02/10/2013); Esophagogastroduodenoscopy (egd) with propofol (N/A, 11/07/2016); Pars plana vitrectomy (Right, 10/23/2018); Membrane peel (Right, 10/23/2018); LEFT HEART CATH AND CORONARY ANGIOGRAPHY (N/A, 02/17/2020); and CORONARY STENT INTERVENTION (N/A, 02/17/2020). Family: family history includes Arthritis in  her father and sister; Breast cancer (age of onset: 13) in her mother; Diabetes in her father and sister; Heart disease in her cousin and mother; Hypertension in her father; Obesity in her sister; Throat cancer in her father.  Laboratory Chemistry Profile   Renal Lab Results  Component Value Date   BUN 45 (H) 03/09/2021  CREATININE 2.13 (H) 03/09/2021   LABCREA 36 06/14/2017   BCR 21 01/26/2021   GFR 49.91 (L) 07/31/2012   GFRAA 38 (L) 03/02/2020   GFRNONAA 22 (L) 03/09/2021     Hepatic Lab Results  Component Value Date   AST 10 (L) 03/09/2021   ALT 8 03/09/2021   ALBUMIN 3.0 (L) 03/09/2021   ALKPHOS 101 03/09/2021   LIPASE 39 08/27/2018     Electrolytes Lab Results  Component Value Date   NA 139 03/09/2021   K 5.0 03/09/2021   CL 109 03/09/2021   CALCIUM 8.7 (L) 03/09/2021   MG 1.4 (L) 10/24/2017   PHOS 3.7 10/09/2017     Bone Lab Results  Component Value Date   VD25OH 21.76 (L) 07/24/2019     Inflammation (CRP: Acute Phase) (ESR: Chronic Phase) Lab Results  Component Value Date   CRP 1.0 (H) 01/30/2019   ESRSEDRATE 110 (H) 09/25/2014   LATICACIDVEN 1.5 01/25/2019       Note: Above Lab results reviewed.  Recent Imaging Review  DG ELBOW COMPLETE RIGHT (3+VIEW) CLINICAL DATA:  Elbow swelling  EXAM: RIGHT ELBOW - COMPLETE 3+ VIEW  COMPARISON:  None.  FINDINGS: Mild degenerative changes with early spurring in joint space narrowing. No acute bony abnormality. Specifically, no fracture, subluxation, or dislocation. No joint effusion.  IMPRESSION: No acute bony abnormality.  Electronically Signed   By: Rolm Baptise M.D.   On: 06/03/2020 18:43  Note: Reviewed        Physical Exam  General appearance: Well nourished, well developed, and well hydrated. In no apparent acute distress Mental status: Alert, oriented x 3 (person, place, & time)       Respiratory: No evidence of acute respiratory distress Eyes: PERLA Vitals: BP (!) 130/98    Pulse 77     Temp (!) 97 F (36.1 C)    Resp 16    Ht _0  (1.702 m)    Wt (!) 316 lb (143.3 kg)    SpO2 97%    BMI 49.49 kg/m  BMI: Estimated body mass index is 49.49 kg/m as calculated from the following:   Height as of this encounter: _1  (1.702 m).   Weight as of this encounter: 316 lb (143.3 kg). Ideal: Ideal body weight: 61.6 kg (135 lb 12.9 oz) Adjusted ideal body weight: 94.3 kg (207 lb 14.1 oz)  Thoracic Spine Area Exam  Skin & Axial Inspection: No masses, redness, or swelling Alignment: Symmetrical Functional ROM: Pain restricted ROM Stability: No instability detected Muscle Tone/Strength: Functionally intact. No obvious neuro-muscular anomalies detected. Sensory (Neurological): Unimpaired Muscle strength & Tone: No palpable anomalies  Bilateral shoulder pain  Right hand pain   Lumbar Exam  Skin & Axial Inspection: No masses, redness, or swelling Alignment: Symmetrical Functional ROM: Pain restricted ROM       Stability: No instability detected Muscle Tone/Strength: Functionally intact. No obvious neuro-muscular anomalies detected. Sensory (Neurological): Musculoskeletal pain pattern  Ambulation: Patient came in today in a wheel chair Gait: Modified gait pattern (slower gait speed, wider stride width, and longer stance duration) associated with morbid obesity Posture: Difficulty standing up straight, due to pain    Lower Extremity Exam      Side: Right lower extremity   Side: Left lower extremity  Stability: No instability observed           Stability: No instability observed          Skin & Extremity Inspection: Skin color, temperature, and hair  growth are WNL. No peripheral edema or cyanosis. No masses, redness, swelling, asymmetry, or associated skin lesions. No contractures.   Skin & Extremity Inspection: Skin color, temperature, and hair growth are WNL. No peripheral edema or cyanosis. No masses, redness, swelling, asymmetry, or associated skin lesions. No contractures.   Functional ROM: Pain restricted ROM for all joints of the lower extremity           Functional ROM: Pain restricted ROM for all joints of the lower extremity          Muscle Tone/Strength: Functionally intact. No obvious neuro-muscular anomalies detected.   Muscle Tone/Strength: Functionally intact. No obvious neuro-muscular anomalies detected.  Sensory (Neurological): Neurogenic pain pattern         Sensory (Neurological): Neurogenic pain pattern        DTR: Patellar: deferred today Achilles: deferred today Plantar: deferred today   DTR: Patellar: deferred today Achilles: deferred today Plantar: deferred today  Palpation: No palpable anomalies   Palpation: No palpable anomalies     Assessment   Status Diagnosis  Having a Flare-up Persistent Persistent 1. Chronic pain of right hand   2. Right elbow pain   3. Lumbar spondylosis   4. Chronic radicular lumbar pain   5. Chronic pain syndrome       Plan of Care  [name] has a current medication list which includes the following long-term medication(s): albuterol, albuterol, atorvastatin, budesonide-formoterol, cetirizine, duloxetine, febuxostat, fluticasone, furosemide, hydralazine, toujeo max solostar, iron polysaccharides, isosorbide mononitrate, linaclotide, memantine, novolog flexpen, pantoprazole, pregabalin, and topiramate.  Pharmacotherapy (Medications Ordered): Meds ordered this encounter  Medications   oxyCODONE (OXY IR/ROXICODONE) 5 MG immediate release tablet    Sig: Take 1 tablet (5 mg total) by mouth every 12 (twelve) hours as needed for severe pain. Must last 30 days.    Dispense:  60 tablet    Refill:  0    Chronic Pain: STOP Act (Not applicable) Fill 1 day early if closed on refill date. Avoid benzodiazepines within 8 hours of opioids   oxyCODONE (OXY IR/ROXICODONE) 5 MG immediate release tablet    Sig: Take 1 tablet (5 mg total) by mouth every 12 (twelve) hours as needed for severe pain. Must last 30  days.    Dispense:  60 tablet    Refill:  0    Chronic Pain: STOP Act (Not applicable) Fill 1 day early if closed on refill date. Avoid benzodiazepines within 8 hours of opioids   oxyCODONE (OXY IR/ROXICODONE) 5 MG immediate release tablet    Sig: Take 1 tablet (5 mg total) by mouth every 12 (twelve) hours as needed for severe pain. Must last 30 days.    Dispense:  60 tablet    Refill:  0    Chronic Pain: STOP Act (Not applicable) Fill 1 day early if closed on refill date. Avoid benzodiazepines within 8 hours of opioids   Orders Placed This Encounter  Procedures   DG Hand Complete Right    Standing Status:   Future    Number of Occurrences:   1    Standing Expiration Date:   05/28/2021    Scheduling Instructions:     Imaging must be done as soon as possible. Inform patient that order will expire within 30 days and I will not renew it.    Order Specific Question:   Reason for Exam (SYMPTOM  OR DIAGNOSIS REQUIRED)    Answer:   right hand pain    Order Specific  Question:   Preferred imaging location?    Answer:   [facility]    Order Specific Question:   Call Results- Best Contact Number?    Answer:   [phone] ([facility])    Order Specific Question:   Radiology Contrast Protocol - do NOT remove file path    Answer:   [email or web address]    Order Specific Question:   Release to patient    Answer:   Immediate   Continue Cymbalta as prescribed.  Follow-up plan:   Return in about 3 months (around 07/26/2021) for Medication Management, in person.      Interventional management options:  Considering:   Intra-articular knee Hyalgan injections can be done in wheelchair Patient is on Plavix and is morbidly obese and unable to get on fluoroscopy table for interventional therapy.   PRN Procedures:   None at this time      Recent Visits No visits were found meeting these conditions. Showing recent visits within past 90 days and  meeting all other requirements Today's Visits Date Type Provider Dept  04/27/21 Office Visit Gillis Santa, MD [facility]  Showing today's visits and meeting all other requirements Future Appointments Date Type Provider Dept  07/19/21 Appointment Gillis Santa, MD [facility]  Showing future appointments within next 90 days and meeting all other requirements  I discussed the assessment and treatment plan with the patient. The patient was provided an opportunity to ask questions and all were answered. The patient agreed with the plan and demonstrated an understanding of the instructions.  Patient advised to call back or seek an in-person evaluation if the symptoms or condition worsens.  Duration of encounter: 30 minutes.  Note by: Gillis Santa, MD Date: 04/27/2021; Time: 2:54 PM

## 2021-04-28 ENCOUNTER — Telehealth: Payer: Self-pay | Admitting: *Deleted

## 2021-04-28 NOTE — Telephone Encounter (Signed)
-----   Message from Gillis Santa, MD sent at 04/28/2021 11:57 AM EST ----- Regarding: call pt with xray results  ----- Message ----- From: Interface, Rad Results In Sent: 04/28/2021  11:39 AM EST To: Gillis Santa, MD

## 2021-04-28 NOTE — Telephone Encounter (Signed)
X-ray results given to patient and her daughter.

## 2021-05-02 DIAGNOSIS — M170 Bilateral primary osteoarthritis of knee: Secondary | ICD-10-CM | POA: Diagnosis not present

## 2021-05-11 ENCOUNTER — Telehealth: Payer: Self-pay | Admitting: Cardiology

## 2021-05-11 NOTE — Telephone Encounter (Signed)
Spoke with the pts daughter [name] (on [place]) and informed her that the pt should be taking both Plavix and ASA, and she needs to be seen for follow-up with Dr. Johney Frame for further refills and continuity of care, for she is overdue. Advised the pts daughter that the pt should be taking both meds and she has enough supply to get her to next visit with Dr. Johney Frame.  Pt is scheduled to see Dr. Johney Frame for overdue follow-up on next Wednesday 1/11 at 0930.  Advised the pts daughter to have her here 15 mins prior to this appt.  Informed her we will further advise on long-term use of both of these meds at this visit, but until then she should continue both ASA and Plavix as prescribed.  Daughter verbalized understanding and agrees with this plan.

## 2021-05-11 NOTE — Telephone Encounter (Signed)
° °  Pt c/o medication issue:  1. Name of Medication: ASPIRIN LOW DOSE 81 MG EC tablet  clopidogrel (PLAVIX) 75 MG tablet    2. How are you currently taking this medication (dosage and times per day)?   3. Are you having a reaction (difficulty breathing--STAT)?   4. What is your medication issue? Pt daughter called, she said, pt got 15 pills of clopidogrel she wanted to know if pt need to take it with her aspirin

## 2021-05-12 ENCOUNTER — Encounter (HOSPITAL_COMMUNITY): Payer: Self-pay

## 2021-05-12 ENCOUNTER — Other Ambulatory Visit: Payer: Self-pay | Admitting: Hematology

## 2021-05-12 ENCOUNTER — Encounter: Payer: Self-pay | Admitting: Hematology

## 2021-05-13 ENCOUNTER — Encounter: Payer: Self-pay | Admitting: Hematology

## 2021-05-13 ENCOUNTER — Other Ambulatory Visit: Payer: Self-pay

## 2021-05-13 ENCOUNTER — Other Ambulatory Visit: Payer: Self-pay | Admitting: Hematology

## 2021-05-13 ENCOUNTER — Ambulatory Visit (INDEPENDENT_AMBULATORY_CARE_PROVIDER_SITE_OTHER): Payer: Medicare HMO | Admitting: Nurse Practitioner

## 2021-05-13 ENCOUNTER — Encounter (HOSPITAL_COMMUNITY): Payer: Self-pay

## 2021-05-13 ENCOUNTER — Encounter: Payer: Self-pay | Admitting: Nurse Practitioner

## 2021-05-13 VITALS — BP 132/84 | HR 83 | Temp 97.5°F

## 2021-05-13 DIAGNOSIS — M109 Gout, unspecified: Secondary | ICD-10-CM | POA: Diagnosis not present

## 2021-05-13 DIAGNOSIS — M159 Polyosteoarthritis, unspecified: Secondary | ICD-10-CM | POA: Diagnosis not present

## 2021-05-13 DIAGNOSIS — G629 Polyneuropathy, unspecified: Secondary | ICD-10-CM

## 2021-05-13 MED ORDER — DICLOFENAC SODIUM 1 % EX GEL: 2.0000 g | Freq: Four times a day (QID) | CUTANEOUS | 2 refills | Status: DC

## 2021-05-13 MED FILL — Iron Sucrose Inj 20 MG/ML (Fe Equiv): INTRAVENOUS | Qty: 15 | Status: AC

## 2021-05-13 NOTE — Patient Instructions (Signed)
Continue oxycodone as prescribed by pain management Make sure to add tylenol 1000 mg every 8 hours as needed Can use Voltaren gel to hands.

## 2021-05-13 NOTE — Progress Notes (Signed)
Careteam: Patient Care Team: [name], NP as PCP - General (Geriatric Medicine) [name], MD as PCP - Cardiology (Cardiology) [name], [name], MD (Inactive) as Consulting Physician (Cardiology) [name], MD as Consulting Physician (Ophthalmology) [name], MD as Consulting Physician (General Surgery) [name], MD (Inactive) as Referring Physician (Obstetrics and Gynecology) [name] [name], MD as Consulting Physician (Ophthalmology) [facility], [name], MD as Consulting Physician (Endocrinology) [name], MD as Consulting Physician (Hematology) [name], DO as Consulting Physician (Neurology)  PLACE OF SERVICE:  [place]  Advanced Directive information    Allergies  Allergen Reactions   Sulfonamide Derivatives Swelling    Mouth swelling   Aricept [Donepezil Hcl] Diarrhea and Nausea And Vomiting    GI upset/loose stools   Tramadol Nausea And Vomiting    Chief Complaint  Patient presents with   Acute Visit    Possible gout flare up, patient c/o right big toe, right hand, and left hand concerns.      HPI: Patient is a 87 y.o. female due to possible gout. Reports she is having a lot of pain in her right hand. Pain management took xray and told her she had arthritis. Taking Oxycodone for the pain- she said she takes 2 at a time. Using tylenol as well.  States the entire right  hand hurts. Stabbing pain. Worse on the thumb today.  Left hand with numbness and pain.   Thought it was gout so she did pure cherry juice which has helped.  Review of Systems:  Review of Systems  Constitutional:  Negative for chills, fever and malaise/fatigue.  Musculoskeletal:  Positive for back pain, joint pain and myalgias.  Psychiatric/Behavioral:  Positive for memory loss.    Past Medical History:  Diagnosis Date   Allergy    takes Mucinex daily as needed   Anemia, unspecified    Anxiety    takes Clonazepam daily as needed    Arthritis    Breast cancer ([place])    Chronic diastolic CHF (congestive heart failure) (HCC)    takes Furosemide daily   CKD (chronic kidney disease), stage III ([place])    Coronary artery disease    a. s/p IMI 2004 tx with BMS to RCA;  b. s/p Promus DES to RCA 2/12 c. abnormal nuc 2016 -> cath with med rx. d. NSTEMI 02/2020  mv CAD with severe ISR in pRCA and m/dRCA lesion both treated with DES.   Dementia ([place])    Depression    takes Cymbalta daily   Diabetes mellitus    insulin daily   GERD (gastroesophageal reflux disease)    takes Protonix daily   Gout, unspecified    Headache    occasionally   History of blood clots    Hyperlipidemia    takes Pravastatin daily   Hypertension    Insomnia    Joint pain    Joint swelling    Morbid obesity ([place])    Myocardial infarction ([place]) 2004   Non-STEMI (non-ST elevated myocardial infarction) ([place]) 02/15/2020   Numbness    Obstructive sleep apnea    does not wear cpap   Osteoarthritis    Osteoarthritis    Osteoarthrosis, unspecified whether generalized or localized, lower leg    Pain, chronic    Polymyalgia rheumatica ([place])    Pulmonary emboli ([place]) 01/2012   felt to need lifelong anticoagulation but Xarelto dc in 02/2020 due to need for DAPT   Urinary incontinence  takes Linzess daily   Vertigo    hx of;was taking Meclizine if needed   Wears dentures    Past Surgical History:  Procedure Laterality Date   ABDOMINAL HYSTERECTOMY     partial   APPENDECTOMY     blood clots/legs and lungs  2013   BREAST BIOPSY Left 07/22/2014   BREAST BIOPSY Left 02/10/2013   BREAST LUMPECTOMY Left 11/05/2014   BREAST LUMPECTOMY WITH RADIOACTIVE SEED LOCALIZATION Left 11/05/2014   Procedure: LEFT BREAST LUMPECTOMY WITH RADIOACTIVE SEED LOCALIZATION;  Surgeon: [name], MD;  Location: [place];  Service: General;  Laterality: Left;   CARDIAC CATHETERIZATION     COLONOSCOPY     CORONARY ANGIOPLASTY  2   CORONARY STENT INTERVENTION N/A  02/17/2020   Procedure: CORONARY STENT INTERVENTION;  Surgeon: Leonie Man, MD;  Location: [place];  Service: Cardiovascular;  Laterality: N/A;   ESOPHAGOGASTRODUODENOSCOPY (EGD) WITH PROPOFOL N/A 11/07/2016   Procedure: ESOPHAGOGASTRODUODENOSCOPY (EGD) WITH PROPOFOL;  Surgeon: Gatha Mayer, MD;  Location: [facility];  Service: Endoscopy;  Laterality: N/A;   EXCISION OF SKIN TAG Right 11/05/2014   Procedure: EXCISION OF RIGHT EYELID SKIN TAG;  Surgeon: [name], MD;  Location: [facility];  Service: General;  Laterality: Right;   EYE SURGERY Bilateral    cataract    GASTRIC BYPASS  1977    reversed in 1979, [facility] CATH AND CORONARY ANGIOGRAPHY N/A 02/17/2020   Procedure: LEFT HEART CATH AND CORONARY ANGIOGRAPHY;  Surgeon: Leonie Man, MD;  Location: [facility];  Service: Cardiovascular;  Laterality: N/A;   LEFT HEART CATHETERIZATION WITH CORONARY ANGIOGRAM N/A 06/29/2014   Procedure: LEFT HEART CATHETERIZATION WITH CORONARY ANGIOGRAM;  Surgeon: Troy Sine, MD;  Location[facility] C[facility];  Service: Cardiovascular;  Laterality: N/A;   MEMBRANE PEEL Right 10/23/2018   Procedure: MEMBRANE PEEL;  Surgeon: Hurman Horn, MD;  Location: [facility];  Service: Ophthalmology;  Laterality: Right;   MI with stent placement  2004   PARS PLANA VITRECTOMY Right 10/23/2018   Procedure: PARS PLANA VITRECTOMY WITH 25 GAUGE;  Surgeon: Hurman Horn, MD;  Location: [facility];  Service: Ophthalmology;  Laterality: Right;   Social History:   reports that she has never smoked. She has never used smokeless tobacco. She reports that she does not drink alcohol and does not use drugs.  Family History  Problem Relation Age of Onset   Breast cancer Mother 82   Heart disease Mother    Throat cancer Father    Hypertension Father    Arthritis Father    Diabetes Father    Arthritis Sister    Obesity Sister    Diabetes Sister    Heart disease Cousin    Colon cancer Neg Hx     Stomach cancer Neg Hx    Esophageal cancer Neg Hx     Medications: Patient's Medications  New Prescriptions   No medications on file  Previous Medications   ALBUTEROL (PROAIR HFA) 108 (90 BASE) MCG/ACT INHALER    Inhale 1-2 puffs into the lungs every 6 (six) hours as needed for wheezing or shortness of breath.   ALBUTEROL (PROVENTIL) (2.5 MG/3ML) 0.083% NEBULIZER SOLUTION    Take 3 mLs (2.5 mg total) by nebulization every 6 (six) hours as needed for wheezing or shortness of breath.   ANASTROZOLE (ARIMIDEX) 1 MG TABLET    TAKE 1 TABLET BY MOUTH EVERY DAY   ASPIRIN LOW DOSE 81  MG EC TABLET    TAKE 1 TABLET BY MOUTH EVERY DAY   ATORVASTATIN (LIPITOR) 80 MG TABLET    TAKE 1 TABLET BY MOUTH EVERY DAY   BUDESONIDE-FORMOTEROL (SYMBICORT) 160-4.5 MCG/ACT INHALER    Inhale 2 puffs into the lungs 2 (two) times daily.   CETIRIZINE (ZYRTEC) 10 MG TABLET    TAKE 1 TABLET BY MOUTH EVERY DAY   CLOPIDOGREL (PLAVIX) 75 MG TABLET    Take 1 tablet (75 mg total) by mouth daily. Please make overdue appt with Dr. Johney Frame (new Cardiologist) before anymore refills. 3rd & final attempt   CONTINUOUS BLOOD GLUC SENSOR (FREESTYLE LIBRE 14 DAY SENSOR) MISC    USE TO INJECT 1 DEVICE INTO THE SKIN AS DIRECTED   CYANOCOBALAMIN (VITAMIN B-12 PO)    Take 1 tablet by mouth once a week.    DULOXETINE (CYMBALTA) 60 MG CAPSULE    Take one capsule by mouth once daily for anxiety.   FEBUXOSTAT (ULORIC) 40 MG TABLET    Take 1 tablet (40 mg total) by mouth daily.   FLUTICASONE (FLONASE) 50 MCG/ACT NASAL SPRAY    Place 1 spray into both nostrils in the morning and at bedtime.   FUROSEMIDE (LASIX) 20 MG TABLET    TAKE 1 TABLET BY MOUTH EVERY DAY AS NEEDED   GUAIFENESIN (MUCINEX) 600 MG 12 HR TABLET    Take 1 tablet (600 mg total) by mouth 2 (two) times daily as needed.   HYDRALAZINE (APRESOLINE) 50 MG TABLET    Take 1 tablet (50 mg total) by mouth 2 (two) times daily.   INSULIN GLARGINE, 2 UNIT DIAL, (TOUJEO MAX SOLOSTAR) 300  UNIT/ML SOLOSTAR PEN    Inject 42 Units into the skin daily. Patient assistance program provides   IRON POLYSACCHARIDES (NIFEREX) 150 MG CAPSULE    Take 1 capsule (150 mg total) by mouth daily.   ISOSORBIDE MONONITRATE (IMDUR) 60 MG 24 HR TABLET    Take 1 tablet (60 mg total) by mouth daily.   LINACLOTIDE (LINZESS) 145 MCG CAPS CAPSULE    Take 1 capsule (145 mcg total) by mouth daily before breakfast.   MECLIZINE (ANTIVERT) 25 MG TABLET    TAKE 1 TABLET BY MOUTH THREE TIMES DAILY AS NEEDED FOR DIZZINESS   MEMANTINE (NAMENDA) 5 MG TABLET    Take 2 by mouth in the am and 1 by mouth in the pm   METOPROLOL SUCCINATE (TOPROL-XL) 50 MG 24 HR TABLET    Take 50 mg by mouth daily.   NITROGLYCERIN (NITROSTAT) 0.4 MG SL TABLET    Take one tablet under the tongue every 5 minutes as needed for chest pain   NOVOLOG FLEXPEN RELION 100 UNIT/ML FLEXPEN    INJECT 16 UNITS SUBCUTANEOUSLY THREE TIMES DAILY WITH MEALS. MAX DAILY DOSE OF 70 UNITS.   NYSTATIN CREAM (MYCOSTATIN)    Apply 1 application topically 2 (two) times daily.   OXYCODONE (OXY IR/ROXICODONE) 5 MG IMMEDIATE RELEASE TABLET    Take 1 tablet (5 mg total) by mouth every 12 (twelve) hours as needed for severe pain. Must last 30 days.   OXYCODONE (OXY IR/ROXICODONE) 5 MG IMMEDIATE RELEASE TABLET    Take 1 tablet (5 mg total) by mouth every 12 (twelve) hours as needed for severe pain. Must last 30 days.   OXYCODONE (OXY IR/ROXICODONE) 5 MG IMMEDIATE RELEASE TABLET    Take 1 tablet (5 mg total) by mouth every 12 (twelve) hours as needed for severe pain. Must last 30 days.  PANTOPRAZOLE (PROTONIX) 40 MG TABLET    TAKE 1 TABLET BY MOUTH EVERY DAY   POLYETHYLENE GLYCOL POWDER (MIRALAX) 17 GM/SCOOP POWDER    17 gm mix with liquid twice daily   PREGABALIN (LYRICA) 25 MG CAPSULE    TAKE 1 CAPSULE BY MOUTH 2 TIMES DAILY   SACCHAROMYCES BOULARDII (FLORASTOR) 250 MG CAPSULE    Take 1 capsule (250 mg total) by mouth 2 (two) times daily.   SENNOSIDES-DOCUSATE SODIUM  (SENOKOT-S) 8.6-50 MG TABLET    Take 2 tablets by mouth 2 (two) times daily.   SODIUM POLYSTYRENE (SPS) 15 GM/60ML SUSPENSION    TAKE 80mls BY MOUTH ONCE A WEEK TO keep potassium down   TOPIRAMATE (TOPAMAX) 25 MG TABLET    Take 1 tablet (25 mg total) by mouth daily.   VITAMIN D, ERGOCALCIFEROL, (DRISDOL) 1.25 MG (50000 UNIT) CAPS CAPSULE    TAKE 1 CAPSULE BY MOUTH ONCE WEEKLY  Modified Medications   No medications on file  Discontinued Medications   No medications on file    Physical Exam:  Vitals:   05/13/21 1428  BP: 132/84  Pulse: 83  Temp: (!) 97.5 F (36.4 C)  TempSrc: Temporal  SpO2: 98%   There is no height or weight on file to calculate BMI. Wt Readings from Last 3 Encounters:  04/27/21 (!) 316 lb (143.3 kg)  03/09/21 (!) 316 lb 8 oz (143.6 kg)  01/26/21 (!) 319 lb (144.7 kg)    Physical Exam Constitutional:      Appearance: Normal appearance. She is obese.  Musculoskeletal:     Right hand: Tenderness (noted to MCP joint) present. Normal range of motion.     Left hand: Normal range of motion. Normal sensation.     Comments: Noted tingling sensation to left and right hand  Point tenderness to thumb joints on right hand Currently without tenderness to joints on right foot    Neurological:     Mental Status: She is alert.    Labs reviewed: Basic Metabolic Panel: Recent Labs    09/07/20 1345 01/26/21 1405 03/09/21 1352  NA 140 140 139  K 4.1 5.5* 5.0  CL 107 109 109  CO2 21* 24 22  GLUCOSE 302* 188* 174*  BUN 36* 35* 45*  CREATININE 1.65* 1.66* 2.13*  CALCIUM 8.4* 9.1 8.7*   Liver Function Tests: Recent Labs    09/07/20 1345 01/26/21 1405 03/09/21 1352  AST 12* 11 10*  ALT 6 9 8   ALKPHOS 89  --  101  BILITOT 0.4 0.3 <0.2*  PROT 7.2 7.2 7.8  ALBUMIN 2.8*  --  3.0*   No results for input(s): LIPASE, AMYLASE in the last 8760 hours. No results for input(s): AMMONIA in the last 8760 hours. CBC: Recent Labs    09/07/20 1345 01/26/21 1405  03/09/21 1352  WBC 12.6* 10.7 11.1*  NEUTROABS 8.9* 7,511 7.6  HGB 8.5* 8.7* 8.9*  HCT 28.6* 28.9* 29.8*  MCV 89.9 88.1 89.5  PLT 256 267 296   Lipid Panel: Recent Labs    01/26/21 1405  CHOL 97  HDL 36*  LDLCALC 42  TRIG 109  CHOLHDL 2.7   TSH: No results for input(s): TSH in the last 8760 hours. A1C: Lab Results  Component Value Date   HGBA1C 8.5 (A) 04/06/2021     Assessment/Plan .1. Primary osteoarthritis involving multiple joints -ongoing pain to  right hand, suspect this is a combination of arthritis and neuropathy, being followed by pain management.  - diclofenac  Sodium (VOLTAREN) 1 % GEL; Apply 2 g topically 4 (four) times daily.  Dispense: 150 g; Refill: 2  2. Gout, unspecified cause, unspecified chronicity, unspecified site -no signs of acute gout. Last uric acid was at goal, continues on uloric 40 mg daily - Uric Acid  3. Diabetic neuropathy ([place]) -ongoing to bilateral hands and feet due to uncontrolled diabetes.  She is currently on max dose of cymbalta and lyrica.    Next appt: 05/27/2021 Carlos American. [place], [facility] [phone]

## 2021-05-14 ENCOUNTER — Inpatient Hospital Stay: Payer: Medicare HMO | Attending: Hematology

## 2021-05-14 ENCOUNTER — Other Ambulatory Visit: Payer: Self-pay

## 2021-05-14 VITALS — BP 133/63 | HR 72 | Temp 98.0°F | Resp 16

## 2021-05-14 DIAGNOSIS — C50812 Malignant neoplasm of overlapping sites of left female breast: Secondary | ICD-10-CM | POA: Insufficient documentation

## 2021-05-14 DIAGNOSIS — Z170 Estrogen receptor positive status [ER+]: Secondary | ICD-10-CM | POA: Insufficient documentation

## 2021-05-14 DIAGNOSIS — D509 Iron deficiency anemia, unspecified: Secondary | ICD-10-CM | POA: Insufficient documentation

## 2021-05-14 DIAGNOSIS — Z808 Family history of malignant neoplasm of other organs or systems: Secondary | ICD-10-CM | POA: Insufficient documentation

## 2021-05-14 DIAGNOSIS — Z8261 Family history of arthritis: Secondary | ICD-10-CM | POA: Insufficient documentation

## 2021-05-14 DIAGNOSIS — Z8249 Family history of ischemic heart disease and other diseases of the circulatory system: Secondary | ICD-10-CM | POA: Insufficient documentation

## 2021-05-14 DIAGNOSIS — E559 Vitamin D deficiency, unspecified: Secondary | ICD-10-CM | POA: Insufficient documentation

## 2021-05-14 DIAGNOSIS — Z79899 Other long term (current) drug therapy: Secondary | ICD-10-CM | POA: Insufficient documentation

## 2021-05-14 DIAGNOSIS — Z803 Family history of malignant neoplasm of breast: Secondary | ICD-10-CM | POA: Insufficient documentation

## 2021-05-14 DIAGNOSIS — Z8349 Family history of other endocrine, nutritional and metabolic diseases: Secondary | ICD-10-CM | POA: Diagnosis not present

## 2021-05-14 LAB — URIC ACID: Uric Acid, Serum: 4.8 mg/dL (ref 2.5–7.0)

## 2021-05-14 MED ORDER — SODIUM CHLORIDE 0.9 % IV SOLN: Freq: Once | INTRAVENOUS | Status: DC | PRN

## 2021-05-14 MED ORDER — ACETAMINOPHEN 325 MG PO TABS
650.0000 mg | ORAL_TABLET | Freq: Once | ORAL | Status: AC
  Administered 2021-05-14: 650 mg via ORAL
  Filled 2021-05-14: qty 2

## 2021-05-14 MED ORDER — SODIUM CHLORIDE 0.9 % IV SOLN
300.0000 mg | Freq: Once | INTRAVENOUS | Status: AC
  Administered 2021-05-14: 300 mg via INTRAVENOUS
  Filled 2021-05-14: qty 300

## 2021-05-14 MED ORDER — SODIUM CHLORIDE 0.9 % IV SOLN: Freq: Once | INTRAVENOUS | Status: AC

## 2021-05-14 MED ORDER — LORATADINE 10 MG PO TABS
10.0000 mg | ORAL_TABLET | Freq: Once | ORAL | Status: AC
  Administered 2021-05-14: 10 mg via ORAL
  Filled 2021-05-14: qty 1

## 2021-05-14 NOTE — Patient Instructions (Signed)

## 2021-05-15 NOTE — Progress Notes (Signed)
Cardiology Office Note:    Date:  05/18/2021   ID:  [name], DOB December 05, 1934, MRN [id number]  PCP:  [name], NP   [facility] Providers Cardiologist:  Ena Dawley, MD {   Referring MD: [name], NP    History of Present Illness:    [name] is a 87 y.o. female with a hx of dementia, CAD (BMS to RCA 2004, DESx2 to mid and proximal RCA 2012, recent NSTEMI 02/2020 s/p DES to RCA), chronic diastolic CHF, DM, morbid obesity, remote gastric bypass and reversal, anemia, PMR, HLD, depression, OCD, anxiety, OA, COPD, prior PE/DVT, CKD stage III, breast CA who presents to clinic for follow-up.  Per review of the record, the patient has history of multiple medical problems as outlined above. She was admitted 02/2020 with chest pain/NSTEMI and underwent cath showing multivessel CAD but severe ISR in pRCA and m/dRCA lesion both treated with DES. There was diffuse mild to moderate disease in the LM as well as Lcx and LAD but nonobstructive and treated medically. LVEF was normal, borderline elevated LVEDP. 2D echo was technically difficult given habitus. Hospital course also notable for anemia requiring blood transfusion (has prior h/o anemia). IM discussed case with Dr. Irene Limbo, her oncologist, regarding ongoing maintenance Xarelto for prior h/o PE/DVT and the mutual decision was made to hold further Xarelto given need for DAPT and history of anemia.   Was seen in 02/2020 with [name] where she was tachycardic with elevated D-dimer. V/Q scan obtained without evidence of PE.  Was seen in 04/2020 with Truitt Merle where the patient was requiring more help with ADLs. No anginal symptoms but mostly sedentary.  Today, the patient feels overall well. No chest pain, SOB, lightheadedness or syncope. Has constant dizziness and this limits her mobility as she is afraid she is going to fall. She has been taking antivert which has helped some. Occasional LE edema and takes lasix  only occasionaly. Blood pressure mainly running 130. No bleeding.  Has been receiving IV iron. Notably, she has a huge pill burden and she is concerned that this may be making her feel unwell. She is hoping to cut back on some of her medications.  Past Medical History:  Diagnosis Date   Allergy    takes Mucinex daily as needed   Anemia, unspecified    Anxiety    takes Clonazepam daily as needed   Arthritis    Breast cancer ([place])    Chronic diastolic CHF (congestive heart failure) (HCC)    takes Furosemide daily   CKD (chronic kidney disease), stage III ([place])    Coronary artery disease    a. s/p IMI 2004 tx with BMS to RCA;  b. s/p Promus DES to RCA 2/12 c. abnormal nuc 2016 -> cath with med rx. d. NSTEMI 02/2020  mv CAD with severe ISR in pRCA and m/dRCA lesion both treated with DES.   Dementia ([place])    Depression    takes Cymbalta daily   Diabetes mellitus    insulin daily   GERD (gastroesophageal reflux disease)    takes Protonix daily   Gout, unspecified    Headache    occasionally   History of blood clots    Hyperlipidemia    takes Pravastatin daily   Hypertension    Insomnia    Joint pain    Joint swelling    Morbid obesity ([place])    Myocardial infarction ([place]) 2004   Non-STEMI (non-ST elevated myocardial  infarction) ([place]) 02/15/2020   Numbness    Obstructive sleep apnea    does not wear cpap   Osteoarthritis    Osteoarthritis    Osteoarthrosis, unspecified whether generalized or localized, lower leg    Pain, chronic    Polymyalgia rheumatica ([place])    Pulmonary emboli ([place]) 01/2012   felt to need lifelong anticoagulation but Xarelto dc in 02/2020 due to need for DAPT   Urinary incontinence    takes Linzess daily   Vertigo    hx of;was taking Meclizine if needed   Wears dentures     Past Surgical History:  Procedure Laterality Date   ABDOMINAL HYSTERECTOMY     partial   APPENDECTOMY     blood clots/legs and lungs  2013   BREAST BIOPSY Left 07/22/2014    BREAST BIOPSY Left 02/10/2013   BREAST LUMPECTOMY Left 11/05/2014   BREAST LUMPECTOMY WITH RADIOACTIVE SEED LOCALIZATION Left 11/05/2014   Procedure: LEFT BREAST LUMPECTOMY WITH RADIOACTIVE SEED LOCALIZATION;  Surgeon: [name], MD;  Location: [place];  Service: General;  Laterality: Left;   CARDIAC CATHETERIZATION     COLONOSCOPY     CORONARY ANGIOPLASTY  2   CORONARY STENT INTERVENTION N/A 02/17/2020   Procedure: CORONARY STENT INTERVENTION;  Surgeon: Leonie Man, MD;  Location: [place] [facility];  Service: Cardiovascular;  Laterality: N/A;   ESOPHAGOGASTRODUODENOSCOPY (EGD) WITH PROPOFOL N/A 11/07/2016   Procedure: ESOPHAGOGASTRODUODENOSCOPY (EGD) WITH PROPOFOL;  Surgeon: Gatha Mayer, MD;  Location: [facility];  Service: Endoscopy;  Laterality: N/A;   EXCISION OF SKIN TAG Right 11/05/2014   Procedure: EXCISION OF RIGHT EYELID SKIN TAG;  Surgeon: [name], MD;  Location: [place];  Service: General;  Laterality: Right;   EYE SURGERY Bilateral    cataract    GASTRIC BYPASS  1977    reversed in 1979, [place] CATH AND CORONARY ANGIOGRAPHY N/A 02/17/2020   Procedure: LEFT HEART CATH AND CORONARY ANGIOGRAPHY;  Surgeon: Leonie Man, MD;  Location: [place] [facility];  Service: Cardiovascular;  Laterality: N/A;   LEFT HEART CATHETERIZATION WITH CORONARY ANGIOGRAM N/A 06/29/2014   Procedure: LEFT HEART CATHETERIZATION WITH CORONARY ANGIOGRAM;  Surgeon: Troy Sine, MD;  Location: [facility];  Service: Cardiovascular;  Laterality: N/A;   MEMBRANE PEEL Right 10/23/2018   Procedure: MEMBRANE PEEL;  Surgeon: Hurman Horn, MD;  Location: [place];  Service: Ophthalmology;  Laterality: Right;   MI with stent placement  2004   PARS PLANA VITRECTOMY Right 10/23/2018   Procedure: PARS PLANA VITRECTOMY WITH 25 GAUGE;  Surgeon: Hurman Horn, MD;  Location: [place];  Service: Ophthalmology;  Laterality: Right;    Current Medications: Current Meds  Medication Sig    albuterol (PROAIR HFA) 108 (90 Base) MCG/ACT inhaler Inhale 1-2 puffs into the lungs every 6 (six) hours as needed for wheezing or shortness of breath.   albuterol (PROVENTIL) (2.5 MG/3ML) 0.083% nebulizer solution Take 3 mLs (2.5 mg total) by nebulization every 6 (six) hours as needed for wheezing or shortness of breath.   anastrozole (ARIMIDEX) 1 MG tablet TAKE 1 TABLET BY MOUTH EVERY DAY   ASPIRIN LOW DOSE 81 MG EC tablet TAKE 1 TABLET BY MOUTH EVERY DAY   atorvastatin (LIPITOR) 80 MG tablet TAKE 1 TABLET BY MOUTH EVERY DAY   budesonide-formoterol (SYMBICORT) 160-4.5 MCG/ACT inhaler Inhale 2 puffs into the lungs 2 (two) times daily.   carvedilol (COREG) 25 MG tablet Take 1 tablet (25  mg total) by mouth 2 (two) times daily.   cetirizine (ZYRTEC) 10 MG tablet TAKE 1 TABLET BY MOUTH EVERY DAY   clopidogrel (PLAVIX) 75 MG tablet Take 1 tablet (75 mg total) by mouth daily. Please make overdue appt with Dr. Johney Frame (new Cardiologist) before anymore refills. 3rd & final attempt   Continuous Blood Gluc Sensor (FREESTYLE LIBRE 14 DAY SENSOR) MISC USE TO INJECT 1 DEVICE INTO THE SKIN AS DIRECTED   Cyanocobalamin (VITAMIN B-12 PO) Take 1 tablet by mouth once a week.    diclofenac Sodium (VOLTAREN) 1 % GEL Apply 2 g topically 4 (four) times daily.   DULoxetine (CYMBALTA) 60 MG capsule Take one capsule by mouth once daily for anxiety.   febuxostat (ULORIC) 40 MG tablet Take 1 tablet (40 mg total) by mouth daily.   fluticasone (FLONASE) 50 MCG/ACT nasal spray Place 1 spray into both nostrils in the morning and at bedtime.   furosemide (LASIX) 20 MG tablet TAKE 1 TABLET BY MOUTH EVERY DAY AS NEEDED   guaiFENesin (MUCINEX) 600 MG 12 hr tablet Take 1 tablet (600 mg total) by mouth 2 (two) times daily as needed.   insulin glargine, 2 Unit Dial, (TOUJEO MAX SOLOSTAR) 300 UNIT/ML Solostar Pen Inject 42 Units into the skin daily. Patient assistance program provides   iron polysaccharides (NIFEREX) 150 MG  capsule Take 1 capsule (150 mg total) by mouth daily.   isosorbide mononitrate (IMDUR) 60 MG 24 hr tablet Take 1 tablet (60 mg total) by mouth daily.   linaclotide (LINZESS) 145 MCG CAPS capsule Take 1 capsule (145 mcg total) by mouth daily before breakfast.   meclizine (ANTIVERT) 25 MG tablet TAKE 1 TABLET BY MOUTH THREE TIMES DAILY AS NEEDED FOR DIZZINESS   memantine (NAMENDA) 5 MG tablet Take 2 by mouth in the am and 1 by mouth in the pm   nitroGLYCERIN (NITROSTAT) 0.4 MG SL tablet Take one tablet under the tongue every 5 minutes as needed for chest pain   NOVOLOG FLEXPEN RELION 100 UNIT/ML FlexPen INJECT 16 UNITS SUBCUTANEOUSLY THREE TIMES DAILY WITH MEALS. MAX DAILY DOSE OF 70 UNITS.   nystatin cream (MYCOSTATIN) Apply 1 application topically 2 (two) times daily.   oxyCODONE (OXY IR/ROXICODONE) 5 MG immediate release tablet Take 1 tablet (5 mg total) by mouth every 12 (twelve) hours as needed for severe pain. Must last 30 days.   [START ON 05/27/2021] oxyCODONE (OXY IR/ROXICODONE) 5 MG immediate release tablet Take 1 tablet (5 mg total) by mouth every 12 (twelve) hours as needed for severe pain. Must last 30 days.   [START ON 06/26/2021] oxyCODONE (OXY IR/ROXICODONE) 5 MG immediate release tablet Take 1 tablet (5 mg total) by mouth every 12 (twelve) hours as needed for severe pain. Must last 30 days.   pantoprazole (PROTONIX) 40 MG tablet TAKE 1 TABLET BY MOUTH EVERY DAY   polyethylene glycol powder (MIRALAX) 17 GM/SCOOP powder 17 gm mix with liquid twice daily   pregabalin (LYRICA) 25 MG capsule TAKE 1 CAPSULE BY MOUTH 2 TIMES DAILY   saccharomyces boulardii (FLORASTOR) 250 MG capsule Take 1 capsule (250 mg total) by mouth 2 (two) times daily.   sennosides-docusate sodium (SENOKOT-S) 8.6-50 MG tablet Take 2 tablets by mouth 2 (two) times daily.   sodium polystyrene (SPS) 15 GM/60ML suspension TAKE 43mls BY MOUTH ONCE A WEEK TO keep potassium down   topiramate (TOPAMAX) 25 MG tablet Take 1 tablet  (25 mg total) by mouth daily.   Vitamin D, Ergocalciferol, (DRISDOL)  1.25 MG (50000 UNIT) CAPS capsule TAKE 1 CAPSULE BY MOUTH ONCE WEEKLY   [DISCONTINUED] hydrALAZINE (APRESOLINE) 50 MG tablet Take 1 tablet (50 mg total) by mouth 2 (two) times daily.   [DISCONTINUED] metoprolol succinate (TOPROL-XL) 50 MG 24 hr tablet Take 50 mg by mouth daily.     Allergies:   Sulfonamide derivatives, Aricept [donepezil hcl], and Tramadol   Social History   Socioeconomic History   Marital status: Widowed    Spouse name: Not on file   Number of children: Not on file   Years of education: Not on file   Highest education level: Not on file  Occupational History   Occupation: retired  Tobacco Use   Smoking status: Never   Smokeless tobacco: Never  Vaping Use   Vaping Use: Never used  Substance and Sexual Activity   Alcohol use: No    Alcohol/week: 0.0 standard drinks   Drug use: No   Sexual activity: Not Currently  Other Topics Concern   Not on file  Social History Narrative   Not on file   Social Determinants of Health   Financial Resource Strain: Not on file  Food Insecurity: Not on file  Transportation Needs: Not on file  Physical Activity: Not on file  Stress: Not on file  Social Connections: Not on file     Family History: [name] family history includes Arthritis in her father and sister; Breast cancer (age of onset: 10) in her mother; Diabetes in her father and sister; Heart disease in her cousin and mother; Hypertension in her father; Obesity in her sister; Throat cancer in her father. There is no history of Colon cancer, Stomach cancer, or Esophageal cancer.  ROS:   Please see the history of present illness.    Review of Systems  Constitutional:  Positive for malaise/fatigue.  Respiratory:  Negative for shortness of breath.   Cardiovascular:  Positive for leg swelling. Negative for chest pain, palpitations, orthopnea and claudication.  Gastrointestinal:  Negative for  blood in stool and melena.  Genitourinary:  Negative for hematuria.  Musculoskeletal:  Negative for falls.  Neurological:  Positive for dizziness. Negative for loss of consciousness.  Psychiatric/Behavioral:  Positive for depression.     EKGs/Labs/Other Studies Reviewed:    The following studies were reviewed today: 2D Echo 02/15/20  1. Technically difficult echo with poor image quality.   2. Left ventricular ejection fraction, by estimation, is 65 to 70%. The  left ventricle has normal function. The left ventricle has no regional  wall motion abnormalities. Left ventricular diastolic parameters are  consistent with Grade I diastolic  dysfunction (impaired relaxation).   3. Right ventricular systolic function was not well visualized. The right  ventricular size is not well visualized.   4. The mitral valve was not well visualized. No evidence of mitral valve  regurgitation.   5. The aortic valve was not well visualized. Aortic valve regurgitation  is not visualized.    Cath 01/75/10 LV end diastolic pressure is mildly elevated. ------------------------------ Mid LM to Prox LAD lesion is 30% stenosed with 30% stenosed side branch in Ost Cx. Prox LAD to Mid LAD lesion is 45% stenosed with 25% stenosed side branch in 1st Diag. 1st Mrg lesion is 100% stenosed. ---------------- Colon Flattery RCA to Prox RCA stent is 10% stenosed. Prox RCA to Mid RCA stent is 5% stenosed. Prox RCA lesion is 95% stenosed, between the 2 old stents A drug-eluting stent was successfully placed using a STENT RESOLUTE ONYX 4.0X18 ->  postdilated to 4.6-4.7 mm Post intervention, there is a 0% residual stenosis. ---------------- Mid RCA lesion is 85% stenosed after the second stent A drug-eluting stent was successfully placed using a STENT RESOLUTE ONYX 3.0X18 -> tapered post dilation from 4.2-3.3 mm Post intervention, there is a 0% residual stenosis.   SUMMARY Multivessel CAD with severe (95%) IntraStent stenosis in  the proximal RCA and de novo stenosis distal to mid RCA stent (85%) -> both treated with DES PCI using Resolute Onyx DES stents (distal-3.0 mm x 18 mm with tapered post dilation 4.2-3.3 mm; proximal 4.0 mm 18 mm postdilated to 4.7 mm.) Diffuse mild to moderate calcified disease in the distal Left Main as well as ostial and proximal LCx and LAD with no severe stenoses. Borderline elevated LVEDP with severe systemic hypertension     RECOMMENDATIONS Return to nursing unit for ongoing care.  Okay to DC heparin. We will need to determine plus or minus the use of Xarelto plus Plavix.  For now would continue aspirin plus Plavix until discharge and then discontinue aspirin on discharge with Xarelto plus Plavix for 3-6 months. Continue to monitor anemia and other risk factors.  EKG:  EKG is  ordered today.  The ekg ordered today demonstrates NSR, inferior q waves, HR75  Recent Labs: 03/09/2021: ALT 8; BUN 45; Creatinine 2.13; Hemoglobin 8.9; Platelet Count 296; Potassium 5.0; Sodium 139  Recent Lipid Panel    Component Value Date/Time   CHOL 97 01/26/2021 1405   CHOL 136 04/20/2015 1001   TRIG 109 01/26/2021 1405   HDL 36 (L) 01/26/2021 1405   HDL 40 04/20/2015 1001   CHOLHDL 2.7 01/26/2021 1405   VLDL 17 02/15/2020 0636   LDLCALC 42 01/26/2021 1405     Risk Assessment/Calculations:           Physical Exam:    VS:  BP 138/74    Pulse 75    Ht 5\' 7"  (1.702 m)    Wt (!) 313 lb (142 kg)    SpO2 96%    BMI 49.02 kg/m     Wt Readings from Last 3 Encounters:  05/18/21 (!) 313 lb (142 kg)  04/27/21 (!) 316 lb (143.3 kg)  03/09/21 (!) 316 lb 8 oz (143.6 kg)     GEN: Morbidly obese, sitting in wheel chair, NAD HEENT: Normal NECK: No carotid bruits; JVD difficult to assess due to body habitus CARDIAC: RRR, no murmurs, rubs, gallops RESPIRATORY:  Clear to auscultation without rales, wheezing or rhonchi  ABDOMEN: Obese, soft, nontender MUSCULOSKELETAL:  Trace edema SKIN: Warm and  dry NEUROLOGIC:  Alert and oriented x 3 PSYCHIATRIC:  Normal affect   ASSESSMENT:    1. Coronary artery disease of native artery of native heart with stable angina pectoris ([place])   2. Essential hypertension   3. Chronic diastolic heart failure ([place])   4. History of pulmonary embolus (PE)   5. Primary hypertension   6. Type 2 diabetes mellitus with complication, with long-term current use of insulin ([place])   7. Polypharmacy    PLAN:    In order of problems listed above:  #CAD with Prior NSTEMI 02/2020 s/p PCI to RCA: Had BMS to RCA 2004, DESx2 to mid and proximal RCA 2012, recent NSTEMI 02/2020 with ISR in [place] and mRCA s/p DES. Doing okay with no anginal symptoms although she is mainly sedentary. -Continue ASA 81mg  daily -Continue plavix 75mg  daily -Continue lipitor 80mg  daily -Change metop to coreg 25mg  BID -Nitro prn  #Chronic  Diastolic HF: Currently appears euvolemic. Minimally mobile at this time and is mainly wheelchair bound.  -Continue lasix 20mg  as needed -Change metop to coreg 25mg  BID  #History of PE/DVT: No longer on xarelto due to anemia. On DAPT. -Continue ASA and plavix -Not on AC due to anemia -Due to limited mobilty, patient is still at high risk of DVT/PE  #HLD: Well controlled and at goal with LDL 42. -Continue lipitor 80mg  daily  #HTN: Mainly 130s at home. Will try to consolidate medications as much as possible due to pill burden. -Change metop to coreg 25mg  BID -Stop hydralazine -Continue imdur 60mg  daily  #DMII on Insulin: A1C 8.5. -Management per PCP -Goal A1C <7  #Anemia: Now on IV iron. Denies bleeding. HgB stable at 8.9. -Management per PCP  #Polypharmacy: Patient has significant pill burden and she is concerned this is making her feel unwell. Agree that she can likely consolidate some of her pain/neuropathy pills. Will have her follow-up with PCP to discuss further. -Follow-up with PCP           Medication Adjustments/Labs and  Tests Ordered: Current medicines are reviewed at length with the patient today.  Concerns regarding medicines are outlined above.  Orders Placed This Encounter  Procedures   EKG 12-Lead   Meds ordered this encounter  Medications   carvedilol (COREG) 25 MG tablet    Sig: Take 1 tablet (25 mg total) by mouth 2 (two) times daily.    Dispense:  180 tablet    Refill:  2    Patient Instructions  Medication Instructions:   STOP TAKING HYDRALAZINE NOW  STOP TAKING METOPROLOL SUCCINATE NOW  START TAKING CARVEDILOL (COREG) 25 MG BY MOUTH TWICE DAILY  *If you need a refill on your cardiac medications before your next appointment, please call your pharmacy*    Follow-Up:  3 MONTHS IN THE OFFICE WITH DR. Johney Frame OR AN EXTENDER IF NO AVAILABILITY }    Other Instructions  PLEASE CALL [facility] NETWORK TO ASSIST YOU WITH FINDING A NEW PCP IN THE AREA WHERE YOU LIVE--CALL [phone], AND THEY WILL BE ABLE TO ASSIST IN GETTING YOU A NEW PRIMARY CARE PROVIDER    Signed, Freada Bergeron, MD  05/18/2021 11:21 AM    [place]

## 2021-05-16 ENCOUNTER — Encounter (HOSPITAL_COMMUNITY): Payer: Self-pay

## 2021-05-16 ENCOUNTER — Encounter: Payer: Self-pay | Admitting: Hematology

## 2021-05-17 ENCOUNTER — Encounter (HOSPITAL_COMMUNITY): Payer: Self-pay

## 2021-05-17 ENCOUNTER — Encounter: Payer: Self-pay | Admitting: Hematology

## 2021-05-18 ENCOUNTER — Encounter: Payer: Self-pay | Admitting: Podiatry

## 2021-05-18 ENCOUNTER — Ambulatory Visit (INDEPENDENT_AMBULATORY_CARE_PROVIDER_SITE_OTHER): Payer: Medicare HMO | Admitting: Podiatry

## 2021-05-18 ENCOUNTER — Ambulatory Visit: Payer: Medicare HMO | Admitting: Cardiology

## 2021-05-18 ENCOUNTER — Other Ambulatory Visit: Payer: Self-pay

## 2021-05-18 ENCOUNTER — Encounter: Payer: Self-pay | Admitting: Cardiology

## 2021-05-18 VITALS — BP 138/74 | HR 75 | Ht 67.0 in | Wt 313.0 lb

## 2021-05-18 DIAGNOSIS — Z794 Long term (current) use of insulin: Secondary | ICD-10-CM

## 2021-05-18 DIAGNOSIS — I5032 Chronic diastolic (congestive) heart failure: Secondary | ICD-10-CM

## 2021-05-18 DIAGNOSIS — I10 Essential (primary) hypertension: Secondary | ICD-10-CM

## 2021-05-18 DIAGNOSIS — M21969 Unspecified acquired deformity of unspecified lower leg: Secondary | ICD-10-CM

## 2021-05-18 DIAGNOSIS — M79609 Pain in unspecified limb: Secondary | ICD-10-CM

## 2021-05-18 DIAGNOSIS — B351 Tinea unguium: Secondary | ICD-10-CM | POA: Diagnosis not present

## 2021-05-18 DIAGNOSIS — E1169 Type 2 diabetes mellitus with other specified complication: Secondary | ICD-10-CM

## 2021-05-18 DIAGNOSIS — I25118 Atherosclerotic heart disease of native coronary artery with other forms of angina pectoris: Secondary | ICD-10-CM

## 2021-05-18 DIAGNOSIS — Z79899 Other long term (current) drug therapy: Secondary | ICD-10-CM

## 2021-05-18 DIAGNOSIS — M79676 Pain in unspecified toe(s): Secondary | ICD-10-CM

## 2021-05-18 DIAGNOSIS — N183 Chronic kidney disease, stage 3 unspecified: Secondary | ICD-10-CM

## 2021-05-18 DIAGNOSIS — Z86711 Personal history of pulmonary embolism: Secondary | ICD-10-CM

## 2021-05-18 DIAGNOSIS — E118 Type 2 diabetes mellitus with unspecified complications: Secondary | ICD-10-CM

## 2021-05-18 MED ORDER — CARVEDILOL 25 MG PO TABS: 25.0000 mg | ORAL_TABLET | Freq: Two times a day (BID) | ORAL | 2 refills | Status: DC

## 2021-05-18 NOTE — Progress Notes (Signed)
This patient returns to my office for at risk foot care.  This patient requires this care by a professional since this patient will be at risk due to having diabetes and CKD. She presents to the office with her daughter in a wheelchair.  This patient is unable to cut nails herself since the patient cannot reach her nails.These nails are painful walking and wearing shoes.  This patient presents for at risk foot care today.  General Appearance  Alert, conversant and in no acute stress.  Vascular  Dorsalis pedis and posterior tibial  pulses are palpable  bilaterally.  Capillary return is within normal limits  bilaterally. Temperature is within normal limits  bilaterally.  Neurologic  Senn-Weinstein monofilament wire test within normal limits  bilaterally. Muscle power within normal limits bilaterally.  Nails Thick disfigured discolored nails with subungual debris  from hallux to fifth toes bilaterally. No evidence of bacterial infection or drainage bilaterally.  Orthopedic  No limitations of motion  feet .  No crepitus or effusions noted.  No bony pathology or digital deformities noted. Deviation of toes 2-5 right foot at the MPJ.  Skin  normotropic skin with no porokeratosis noted bilaterally.  No signs of infections or ulcers noted.     Onychomycosis  Pain in right toes  Pain in left toes  Consent was obtained for treatment procedures.   Mechanical debridement of nails 1-5  bilaterally performed with a nail nipper.  Filed with dremel without incident.    Return office visit  4 months                   Told patient to return for periodic foot care and evaluation due to potential at risk complications.    [name] DPM

## 2021-05-18 NOTE — Patient Instructions (Signed)
Medication Instructions:   STOP TAKING HYDRALAZINE NOW  STOP TAKING METOPROLOL SUCCINATE NOW  START TAKING CARVEDILOL (COREG) 25 MG BY MOUTH TWICE DAILY  *If you need a refill on your cardiac medications before your next appointment, please call your pharmacy*    Follow-Up:  3 MONTHS IN THE OFFICE WITH DR. Johney Frame OR AN EXTENDER IF NO AVAILABILITY }    Other Instructions  PLEASE CALL [facility] NETWORK TO ASSIST YOU WITH FINDING A NEW PCP IN THE AREA WHERE YOU LIVE--CALL [phone], AND THEY WILL BE ABLE TO ASSIST IN GETTING YOU A NEW PRIMARY CARE PROVIDER

## 2021-05-23 ENCOUNTER — Telehealth: Payer: Self-pay | Admitting: Internal Medicine

## 2021-05-23 ENCOUNTER — Other Ambulatory Visit: Payer: Self-pay

## 2021-05-23 MED ORDER — NOVOLOG FLEXPEN RELION 100 UNIT/ML ~~LOC~~ SOPN: PEN_INJECTOR | SUBCUTANEOUS | 0 refills | Status: DC

## 2021-05-23 NOTE — Telephone Encounter (Signed)
Vm left for patient to call back.

## 2021-05-23 NOTE — Telephone Encounter (Signed)
Spoke with Doren Custard from [employer] and they have received patient application and will process in the order they are received. States that it could be another week or so not sure. We do have a sample patient can pick up on [employer].

## 2021-05-23 NOTE — Telephone Encounter (Signed)
insulin glargine, 2 Unit Dial, (TOUJEO MAX SOLOSTAR) 300 UNIT/ML Solostar Pen  NOVOLOG FLEXPEN RELION 100 UNIT/ML FlexPen  Per patient she is receiving sample of the above medication from the office. She also request Patient Assistance for [place]. She is unable able to afford. Please call patient at [phone].

## 2021-05-23 NOTE — Telephone Encounter (Signed)
Novolog has been sent to [facility] patient will go by and pick up script. I will have to call [employer] about patient assistance for Toujeo as I don't show a application in chart since 2021 which would have expired in 06/2020.

## 2021-05-24 MED ORDER — TOUJEO MAX SOLOSTAR 300 UNIT/ML ~~LOC~~ SOPN: 42.0000 [IU] | PEN_INJECTOR | Freq: Every day | SUBCUTANEOUS | 6 refills | Status: DC

## 2021-05-24 NOTE — Telephone Encounter (Signed)
Patient is not currently enrolled for the Toujeo max and will have to re apply. Script has been sent to pharmacy.

## 2021-05-24 NOTE — Addendum Note (Signed)
Addended by: Jefferson Fuel on: 05/24/2021 10:34 AM   Modules accepted: Orders

## 2021-05-25 ENCOUNTER — Other Ambulatory Visit: Payer: Self-pay | Admitting: Internal Medicine

## 2021-05-25 MED ORDER — INSULIN LISPRO (1 UNIT DIAL) 100 UNIT/ML (KWIKPEN): PEN_INJECTOR | SUBCUTANEOUS | 3 refills | Status: DC

## 2021-05-25 NOTE — Progress Notes (Signed)
Insurance sent a letter that Novolog is no on formulary  Will switch to Humalog

## 2021-05-26 ENCOUNTER — Other Ambulatory Visit: Payer: Self-pay

## 2021-05-26 MED ORDER — CLOPIDOGREL BISULFATE 75 MG PO TABS: 75.0000 mg | ORAL_TABLET | Freq: Every day | ORAL | 3 refills | Status: DC

## 2021-05-27 ENCOUNTER — Other Ambulatory Visit: Payer: Self-pay

## 2021-05-27 ENCOUNTER — Ambulatory Visit (INDEPENDENT_AMBULATORY_CARE_PROVIDER_SITE_OTHER): Payer: Medicare HMO | Admitting: Nurse Practitioner

## 2021-05-27 ENCOUNTER — Encounter: Payer: Self-pay | Admitting: Nurse Practitioner

## 2021-05-27 VITALS — BP 124/80 | HR 73 | Temp 97.3°F

## 2021-05-27 DIAGNOSIS — M109 Gout, unspecified: Secondary | ICD-10-CM

## 2021-05-27 DIAGNOSIS — E1140 Type 2 diabetes mellitus with diabetic neuropathy, unspecified: Secondary | ICD-10-CM

## 2021-05-27 DIAGNOSIS — F322 Major depressive disorder, single episode, severe without psychotic features: Secondary | ICD-10-CM

## 2021-05-27 DIAGNOSIS — C50812 Malignant neoplasm of overlapping sites of left female breast: Secondary | ICD-10-CM

## 2021-05-27 DIAGNOSIS — Z794 Long term (current) use of insulin: Secondary | ICD-10-CM

## 2021-05-27 DIAGNOSIS — K219 Gastro-esophageal reflux disease without esophagitis: Secondary | ICD-10-CM | POA: Diagnosis not present

## 2021-05-27 DIAGNOSIS — G8929 Other chronic pain: Secondary | ICD-10-CM

## 2021-05-27 DIAGNOSIS — I5032 Chronic diastolic (congestive) heart failure: Secondary | ICD-10-CM

## 2021-05-27 DIAGNOSIS — K5903 Drug induced constipation: Secondary | ICD-10-CM

## 2021-05-27 DIAGNOSIS — Z170 Estrogen receptor positive status [ER+]: Secondary | ICD-10-CM

## 2021-05-27 DIAGNOSIS — F01B0 Vascular dementia, moderate, without behavioral disturbance, psychotic disturbance, mood disturbance, and anxiety: Secondary | ICD-10-CM

## 2021-05-27 DIAGNOSIS — E875 Hyperkalemia: Secondary | ICD-10-CM

## 2021-05-27 DIAGNOSIS — R519 Headache, unspecified: Secondary | ICD-10-CM

## 2021-05-27 DIAGNOSIS — J449 Chronic obstructive pulmonary disease, unspecified: Secondary | ICD-10-CM

## 2021-05-27 MED ORDER — PANTOPRAZOLE SODIUM 20 MG PO TBEC: 20.0000 mg | DELAYED_RELEASE_TABLET | Freq: Every day | ORAL | 0 refills | Status: DC

## 2021-05-27 NOTE — Patient Instructions (Addendum)
Decrease lyrica to once daily for 2 weeks then stop.   NEXT month decrease protonix to 20 mg daily- if no worsening symptoms of acid reflux or cough stop after next month.

## 2021-05-27 NOTE — Progress Notes (Signed)
Careteam: Patient Care Team: [name], NP as PCP - General (Geriatric Medicine) [name], MD as PCP - Cardiology (Cardiology) [name], [name], MD (Inactive) as Consulting Physician (Cardiology) [name], MD as Consulting Physician (Ophthalmology) [name], MD as Consulting Physician (General Surgery) [name], MD (Inactive) as Referring Physician (Obstetrics and Gynecology) [name] [name], MD as Consulting Physician (Ophthalmology) [facility], [name], MD as Consulting Physician (Endocrinology) [name], MD as Consulting Physician (Hematology) [name], DO as Consulting Physician (Neurology)  PLACE OF SERVICE:  [place]  Advanced Directive information    Allergies  Allergen Reactions   Sulfonamide Derivatives Swelling    Mouth swelling   Aricept [Donepezil Hcl] Diarrhea and Nausea And Vomiting    GI upset/loose stools   Tramadol Nausea And Vomiting    Chief Complaint  Patient presents with   Medical Management of Chronic Issues    4 month follow-up and sign treatment agreement for Lyrica. Discuss need for shingrix, td/tdap, urine microalbumin, covid boosters, and eye exam or post pone if patient refuses. Patient c/o bilateral hand pain, right is worse.      HPI: Patient is a 87 y.o. female for routine follow up  Ms Pitstick expressed to cardiologist that she was taking too many medication and wanted to consolidate.   She had a lot of pain in her right hand- got Voltaren gel which helped. Continues to be numb and stiff but pain has improved  GERD- on protonix 40 mg daily. Does not report any GERD symptoms.   Iron def anemia- 2 weeks ago had iron infusion. Feeling better. Not as worn out.   Constipation- controlled on miralax and linzess, continues on oxycodone PRN pain.  Not taking sennoside (updated medication list)  Chronic pain- followed by pain clinic- does feel like oxycodone helps pain but she is not  taking as prescribed, she takes 10 mg daily at once.   Stopped florastor  Chronic headaches- on topamax but unsure if this is helping.   Chronic pain- using oxycodone 10 mg by mouth daily- only taking once a day takes 2.    Review of Systems:  Review of Systems  Constitutional:  Positive for malaise/fatigue. Negative for chills, fever and weight loss.  HENT:  Negative for hearing loss and tinnitus.   Respiratory:  Negative for cough, sputum production and shortness of breath.   Cardiovascular:  Negative for chest pain, palpitations and leg swelling.  Gastrointestinal:  Negative for abdominal pain, constipation, diarrhea and heartburn.  Genitourinary:  Negative for dysuria, frequency and urgency.  Musculoskeletal:  Positive for joint pain and myalgias. Negative for back pain and falls.  Skin: Negative.   Neurological:  Positive for tingling and weakness. Negative for dizziness and headaches.  Psychiatric/Behavioral:  Positive for memory loss. Negative for depression. The patient does not have insomnia.    Past Medical History:  Diagnosis Date   Allergy    takes Mucinex daily as needed   Anemia, unspecified    Anxiety    takes Clonazepam daily as needed   Arthritis    Breast cancer ([place])    Chronic diastolic CHF (congestive heart failure) (HCC)    takes Furosemide daily   CKD (chronic kidney disease), stage III ([place])    Coronary artery disease    a. s/p IMI 2004 tx with BMS to RCA;  b. s/p Promus DES to RCA 2/12 c. abnormal nuc 2016 -> cath with med rx. d. NSTEMI 02/2020  mv CAD with severe ISR in pRCA and m/dRCA lesion both treated with DES.   Dementia ([place])    Depression    takes Cymbalta daily   Diabetes mellitus    insulin daily   GERD (gastroesophageal reflux disease)    takes Protonix daily   Gout, unspecified    Headache    occasionally   History of blood clots    Hyperlipidemia    takes Pravastatin daily   Hypertension    Insomnia    Joint pain    Joint  swelling    Morbid obesity ([place])    Myocardial infarction ([place]) 2004   Non-STEMI (non-ST elevated myocardial infarction) ([place]) 02/15/2020   Numbness    Obstructive sleep apnea    does not wear cpap   Osteoarthritis    Osteoarthritis    Osteoarthrosis, unspecified whether generalized or localized, lower leg    Pain, chronic    Polymyalgia rheumatica ([place])    Pulmonary emboli ([place]) 01/2012   felt to need lifelong anticoagulation but Xarelto dc in 02/2020 due to need for DAPT   Urinary incontinence    takes Linzess daily   Vertigo    hx of;was taking Meclizine if needed   Wears dentures    Past Surgical History:  Procedure Laterality Date   ABDOMINAL HYSTERECTOMY     partial   APPENDECTOMY     blood clots/legs and lungs  2013   BREAST BIOPSY Left 07/22/2014   BREAST BIOPSY Left 02/10/2013   BREAST LUMPECTOMY Left 11/05/2014   BREAST LUMPECTOMY WITH RADIOACTIVE SEED LOCALIZATION Left 11/05/2014   Procedure: LEFT BREAST LUMPECTOMY WITH RADIOACTIVE SEED LOCALIZATION;  Surgeon: [name], MD;  Location: [place] [facility];  Service: General;  Laterality: Left;   CARDIAC CATHETERIZATION     COLONOSCOPY     CORONARY ANGIOPLASTY  2   CORONARY STENT INTERVENTION N/A 02/17/2020   Procedure: CORONARY STENT INTERVENTION;  Surgeon: Leonie Man, MD;  Location: [place] [facility];  Service: Cardiovascular;  Laterality: N/A;   ESOPHAGOGASTRODUODENOSCOPY (EGD) WITH PROPOFOL N/A 11/07/2016   Procedure: ESOPHAGOGASTRODUODENOSCOPY (EGD) WITH PROPOFOL;  Surgeon: Gatha Mayer, MD;  Location: [facility];  Service: Endoscopy;  Laterality: N/A;   EXCISION OF SKIN TAG Right 11/05/2014   Procedure: EXCISION OF RIGHT EYELID SKIN TAG;  Surgeon: [name], MD;  Location: [facility];  Service: General;  Laterality: Right;   EYE SURGERY Bilateral    cataract    GASTRIC BYPASS  1977    reversed in 1979, [place] CATH AND CORONARY ANGIOGRAPHY N/A 02/17/2020   Procedure: LEFT HEART CATH  AND CORONARY ANGIOGRAPHY;  Surgeon: Leonie Man, MD;  Location: [facility];  Service: Cardiovascular;  Laterality: N/A;   LEFT HEART CATHETERIZATION WITH CORONARY ANGIOGRAM N/A 06/29/2014   Procedure: LEFT HEART CATHETERIZATION WITH CORONARY ANGIOGRAM;  Surgeon: Troy Sine, MD;  Location: [facility];  Service: Cardiovascular;  Laterality: N/A;   MEMBRANE PEEL Right 10/23/2018   Procedure: MEMBRANE PEEL;  Surgeon: Hurman Horn, MD;  Location: [facility];  Service: Ophthalmology;  Laterality: Right;   MI with stent placement  2004   PARS PLANA VITRECTOMY Right 10/23/2018   Procedure: PARS PLANA VITRECTOMY WITH 25 GAUGE;  Surgeon: Hurman Horn, MD;  Location: [place];  Service: Ophthalmology;  Laterality: Right;   Social History:   reports that she has never smoked. She has been exposed to tobacco smoke. She has never used smokeless tobacco. She reports  that she does not drink alcohol and does not use drugs.  Family History  Problem Relation Age of Onset   Breast cancer Mother 87   Heart disease Mother    Throat cancer Father    Hypertension Father    Arthritis Father    Diabetes Father    Arthritis Sister    Obesity Sister    Diabetes Sister    Heart disease Cousin    Colon cancer Neg Hx    Stomach cancer Neg Hx    Esophageal cancer Neg Hx     Medications: Patient's Medications  New Prescriptions   No medications on file  Previous Medications   ALBUTEROL (PROAIR HFA) 108 (90 BASE) MCG/ACT INHALER    Inhale 1-2 puffs into the lungs every 6 (six) hours as needed for wheezing or shortness of breath.   ALBUTEROL (PROVENTIL) (2.5 MG/3ML) 0.083% NEBULIZER SOLUTION    Take 3 mLs (2.5 mg total) by nebulization every 6 (six) hours as needed for wheezing or shortness of breath.   ANASTROZOLE (ARIMIDEX) 1 MG TABLET    TAKE 1 TABLET BY MOUTH EVERY DAY   ASPIRIN LOW DOSE 81 MG EC TABLET    TAKE 1 TABLET BY MOUTH EVERY DAY   ATORVASTATIN (LIPITOR) 80 MG TABLET    TAKE 1 TABLET BY  MOUTH EVERY DAY   BUDESONIDE-FORMOTEROL (SYMBICORT) 160-4.5 MCG/ACT INHALER    Inhale 2 puffs into the lungs 2 (two) times daily.   CARVEDILOL (COREG) 25 MG TABLET    Take 1 tablet (25 mg total) by mouth 2 (two) times daily.   CETIRIZINE (ZYRTEC) 10 MG TABLET    TAKE 1 TABLET BY MOUTH EVERY DAY   CLOPIDOGREL (PLAVIX) 75 MG TABLET    Take 1 tablet (75 mg total) by mouth daily.   CONTINUOUS BLOOD GLUC SENSOR (FREESTYLE LIBRE 14 DAY SENSOR) MISC    USE TO INJECT 1 DEVICE INTO THE SKIN AS DIRECTED   CYANOCOBALAMIN (VITAMIN B-12 PO)    Take 1 tablet by mouth once a week.    DICLOFENAC SODIUM (VOLTAREN) 1 % GEL    Apply 2 g topically 4 (four) times daily.   DULOXETINE (CYMBALTA) 60 MG CAPSULE    Take one capsule by mouth once daily for anxiety.   FEBUXOSTAT (ULORIC) 40 MG TABLET    Take 1 tablet (40 mg total) by mouth daily.   FLUTICASONE (FLONASE) 50 MCG/ACT NASAL SPRAY    Place 1 spray into both nostrils in the morning and at bedtime.   FUROSEMIDE (LASIX) 20 MG TABLET    TAKE 1 TABLET BY MOUTH EVERY DAY AS NEEDED   GUAIFENESIN (MUCINEX) 600 MG 12 HR TABLET    Take 1 tablet (600 mg total) by mouth 2 (two) times daily as needed.   INSULIN GLARGINE, 2 UNIT DIAL, (TOUJEO MAX SOLOSTAR) 300 UNIT/ML SOLOSTAR PEN    Inject 42 Units into the skin daily. Patient assistance program provides   INSULIN LISPRO (HUMALOG KWIKPEN) 100 UNIT/ML KWIKPEN    Max daily 40 units   IRON POLYSACCHARIDES (NIFEREX) 150 MG CAPSULE    Take 1 capsule (150 mg total) by mouth daily.   ISOSORBIDE MONONITRATE (IMDUR) 60 MG 24 HR TABLET    Take 1 tablet (60 mg total) by mouth daily.   LINACLOTIDE (LINZESS) 145 MCG CAPS CAPSULE    Take 1 capsule (145 mcg total) by mouth daily before breakfast.   MECLIZINE (ANTIVERT) 25 MG TABLET    TAKE 1 TABLET BY MOUTH THREE  TIMES DAILY AS NEEDED FOR DIZZINESS   MEMANTINE (NAMENDA) 5 MG TABLET    Take 2 by mouth in the am and 1 by mouth in the pm   NITROGLYCERIN (NITROSTAT) 0.4 MG SL TABLET    Take  one tablet under the tongue every 5 minutes as needed for chest pain   NYSTATIN CREAM (MYCOSTATIN)    Apply 1 application topically 2 (two) times daily.   OXYCODONE (OXY IR/ROXICODONE) 5 MG IMMEDIATE RELEASE TABLET    Take 1 tablet (5 mg total) by mouth every 12 (twelve) hours as needed for severe pain. Must last 30 days.   OXYCODONE (OXY IR/ROXICODONE) 5 MG IMMEDIATE RELEASE TABLET    Take 1 tablet (5 mg total) by mouth every 12 (twelve) hours as needed for severe pain. Must last 30 days.   OXYCODONE (OXY IR/ROXICODONE) 5 MG IMMEDIATE RELEASE TABLET    Take 1 tablet (5 mg total) by mouth every 12 (twelve) hours as needed for severe pain. Must last 30 days.   POLYETHYLENE GLYCOL POWDER (MIRALAX) 17 GM/SCOOP POWDER    17 gm mix with liquid twice daily   SACCHAROMYCES BOULARDII (FLORASTOR) 250 MG CAPSULE    Take 1 capsule (250 mg total) by mouth 2 (two) times daily.   SODIUM POLYSTYRENE (SPS) 15 GM/60ML SUSPENSION    TAKE 75mls BY MOUTH ONCE A WEEK TO keep potassium down   TOPIRAMATE (TOPAMAX) 25 MG TABLET    Take 1 tablet (25 mg total) by mouth daily.   VITAMIN D, ERGOCALCIFEROL, (DRISDOL) 1.25 MG (50000 UNIT) CAPS CAPSULE    TAKE 1 CAPSULE BY MOUTH ONCE WEEKLY  Modified Medications   Modified Medication Previous Medication   PANTOPRAZOLE (PROTONIX) 20 MG TABLET pantoprazole (PROTONIX) 40 MG tablet      Take 1 tablet (20 mg total) by mouth daily.    TAKE 1 TABLET BY MOUTH EVERY DAY  Discontinued Medications   PREGABALIN (LYRICA) 25 MG CAPSULE    TAKE 1 CAPSULE BY MOUTH 2 TIMES DAILY   SENNOSIDES-DOCUSATE SODIUM (SENOKOT-S) 8.6-50 MG TABLET    Take 2 tablets by mouth 2 (two) times daily.    Physical Exam:  Vitals:   05/27/21 1335  BP: 124/80  Pulse: 73  Temp: (!) 97.3 F (36.3 C)  TempSrc: Temporal  SpO2: 99%   There is no height or weight on file to calculate BMI. Wt Readings from Last 3 Encounters:  05/18/21 (!) 313 lb (142 kg)  04/27/21 (!) 316 lb (143.3 kg)  03/09/21 (!) 316 lb 8  oz (143.6 kg)    Physical Exam Constitutional:      General: She is not in acute distress.    Appearance: She is well-developed. She is not diaphoretic.  HENT:     Head: Normocephalic and atraumatic.     Mouth/Throat:     Pharynx: No oropharyngeal exudate.  Eyes:     Conjunctiva/sclera: Conjunctivae normal.     Pupils: Pupils are equal, round, and reactive to light.  Cardiovascular:     Rate and Rhythm: Normal rate and regular rhythm.     Heart sounds: Normal heart sounds.  Pulmonary:     Effort: Pulmonary effort is normal.     Breath sounds: Normal breath sounds.  Abdominal:     General: Bowel sounds are normal.     Palpations: Abdomen is soft.  Musculoskeletal:     Cervical back: Normal range of motion and neck supple.     Right lower leg: No edema.  Left lower leg: No edema.  Skin:    General: Skin is warm and dry.  Neurological:     Mental Status: She is alert. Mental status is at baseline.     Gait: Gait abnormal (in wheelchair).    Labs reviewed: Basic Metabolic Panel: Recent Labs    09/07/20 1345 01/26/21 1405 03/09/21 1352  NA 140 140 139  K 4.1 5.5* 5.0  CL 107 109 109  CO2 21* 24 22  GLUCOSE 302* 188* 174*  BUN 36* 35* 45*  CREATININE 1.65* 1.66* 2.13*  CALCIUM 8.4* 9.1 8.7*   Liver Function Tests: Recent Labs    09/07/20 1345 01/26/21 1405 03/09/21 1352  AST 12* 11 10*  ALT 6 9 8   ALKPHOS 89  --  101  BILITOT 0.4 0.3 <0.2*  PROT 7.2 7.2 7.8  ALBUMIN 2.8*  --  3.0*   No results for input(s): LIPASE, AMYLASE in the last 8760 hours. No results for input(s): AMMONIA in the last 8760 hours. CBC: Recent Labs    09/07/20 1345 01/26/21 1405 03/09/21 1352  WBC 12.6* 10.7 11.1*  NEUTROABS 8.9* 7,511 7.6  HGB 8.5* 8.7* 8.9*  HCT 28.6* 28.9* 29.8*  MCV 89.9 88.1 89.5  PLT 256 267 296   Lipid Panel: Recent Labs    01/26/21 1405  CHOL 97  HDL 36*  LDLCALC 42  TRIG 109  CHOLHDL 2.7   TSH: No results for input(s): TSH in the last  8760 hours. A1C: Lab Results  Component Value Date   HGBA1C 8.5 (A) 04/06/2021     Assessment/Plan 1. Gout, unspecified cause, unspecified chronicity, unspecified site -uric acid level at goal on last labs, continues on uloric  2. Type 2 diabetes mellitus with diabetic neuropathy, with long-term current use of insulin ([place]) Ongoing follow up with endocrinologist, encouraged compliance with medication as well as dietary compliance, routine foot care/monitoring and to keep up with diabetic eye exams through ophthalmology.  Does not feel like she is getting benefit from lyric, will stop at this time.   3. Drug-induced constipation Controlled on twice daily miralax and linzness.   4. Gastroesophageal reflux disease without esophagitis -no complaints of GERD, will have her titrate off protonix - pantoprazole (PROTONIX) 20 MG tablet; Take 1 tablet (20 mg total) by mouth daily.  Dispense: 30 tablet; Refill: 0  5. Chronic diastolic congestive heart failure (HCC) -evolemic at this time. Continue current regimen.   6. Hyperkalemia -continues on sodium polystyrene   7. Moderate vascular dementia without behavioral disturbance, psychotic disturbance, mood disturbance, or anxiety Ongoing. Expect further decline with disease progression. Continues on namenda twice daily  8. Chronic obstructive pulmonary disease, unspecified COPD type ([place]) -stable, without worsening shortness of breath, cough or congestion, The current medical regimen is effective;  continue present plan and medications.  9. Depression, major, single episode, severe ([place]) -ongoing, continues on cymbalta- do no feel like titrate of this would be beneficial at this time  10. Morbid (severe) obesity due to excess calories ([place]) -education provided on healthy weight loss through increase in physical activity and proper nutrition   11. Malignant neoplasm of overlapping sites of left breast in female, estrogen receptor positive  ([place]) Followed by oncology, continues on arimidex 1 mg daily   12. Chronic nonintractable headache, unspecified headache type -continues on topamax- consider titration off at next follow up  Next appt: 3 months.  Carlos American. [place], [facility] [phone]

## 2021-05-30 ENCOUNTER — Telehealth: Payer: Self-pay | Admitting: *Deleted

## 2021-05-30 ENCOUNTER — Other Ambulatory Visit: Payer: Self-pay | Admitting: *Deleted

## 2021-05-30 ENCOUNTER — Other Ambulatory Visit: Payer: Self-pay | Admitting: Nurse Practitioner

## 2021-05-30 ENCOUNTER — Other Ambulatory Visit: Payer: Self-pay | Admitting: Hematology

## 2021-05-30 DIAGNOSIS — J309 Allergic rhinitis, unspecified: Secondary | ICD-10-CM

## 2021-05-30 MED ORDER — FUROSEMIDE 20 MG PO TABS: 20.0000 mg | ORAL_TABLET | Freq: Every day | ORAL | 3 refills | Status: DC | PRN

## 2021-05-30 NOTE — Telephone Encounter (Signed)
To STOP the Protonix 40 mg- new dose is 20 mg daily

## 2021-05-30 NOTE — Telephone Encounter (Signed)
Danielle with [facility] called and stated that she needs clarification on patient's medication.  Stated that patient gets Pantoprazole 40mg  prepackaged and she received a Rx last week for Pantoprazole 20mg .  Stated that it did not say to discontinue, so she is wondering if patient should be taking the 20mg  on top of the 40mg  or should patient Discontinue the 40mg  and start the 20mg .   Please Advise.

## 2021-05-30 NOTE — Telephone Encounter (Signed)
[name] with [facility] notified and agreed.

## 2021-05-30 NOTE — Telephone Encounter (Signed)
Pharmacy requested refill

## 2021-05-31 ENCOUNTER — Encounter: Payer: Self-pay | Admitting: Hematology

## 2021-05-31 ENCOUNTER — Encounter (HOSPITAL_COMMUNITY): Payer: Self-pay

## 2021-06-02 NOTE — Telephone Encounter (Signed)
Patient picked up 1 box of Turlicity

## 2021-06-21 ENCOUNTER — Telehealth: Payer: Self-pay

## 2021-06-21 ENCOUNTER — Ambulatory Visit
Admission: RE | Admit: 2021-06-21 | Discharge: 2021-06-21 | Disposition: A | Discharge status: Home / Self Care | Payer: Self-pay | Source: Ambulatory Visit | Attending: Nurse Practitioner | Admitting: Nurse Practitioner

## 2021-06-21 ENCOUNTER — Encounter (HOSPITAL_COMMUNITY): Payer: Self-pay

## 2021-06-21 ENCOUNTER — Other Ambulatory Visit: Payer: Self-pay

## 2021-06-21 ENCOUNTER — Encounter: Payer: Self-pay | Admitting: Hematology

## 2021-06-21 ENCOUNTER — Other Ambulatory Visit: Payer: Self-pay | Admitting: Nurse Practitioner

## 2021-06-21 DIAGNOSIS — Z853 Personal history of malignant neoplasm of breast: Secondary | ICD-10-CM

## 2021-06-21 DIAGNOSIS — Z1231 Encounter for screening mammogram for malignant neoplasm of breast: Secondary | ICD-10-CM | POA: Diagnosis not present

## 2021-06-21 NOTE — Telephone Encounter (Signed)
Insurance card has been sent to [facility] to process patient application .

## 2021-06-22 ENCOUNTER — Telehealth: Payer: Self-pay

## 2021-06-22 NOTE — Telephone Encounter (Signed)
[place] patient assistance has been denied for Trulicity due to high demand for medication.

## 2021-06-23 ENCOUNTER — Telehealth: Payer: Self-pay

## 2021-06-23 NOTE — Telephone Encounter (Signed)
Patient is taking Zyrtec 10 mg daily, Hydralazine 10 mg three times daily (restarted on her own yesterday),  and using clobetasol 0/05 % cream as needed. Patient has a rash on both inner thighs and hips. Patient also has itching and raw areas under her breast .

## 2021-06-23 NOTE — Telephone Encounter (Signed)
Spoke with patients sister and patient refused appointment

## 2021-06-23 NOTE — Telephone Encounter (Signed)
Needs appt for evaluation.

## 2021-06-23 NOTE — Telephone Encounter (Signed)
Does she have a rash? What medication is she taking for the itching

## 2021-06-23 NOTE — Telephone Encounter (Signed)
Patient with increased itching in thigh and hip area despite taking medications/creams for itching. Patient is requesting something different. Per Patients sister, patient is taking medication as prescribed  Please advise

## 2021-07-03 DIAGNOSIS — M170 Bilateral primary osteoarthritis of knee: Secondary | ICD-10-CM | POA: Diagnosis not present

## 2021-07-04 ENCOUNTER — Observation Stay (HOSPITAL_COMMUNITY): Payer: HMO

## 2021-07-04 ENCOUNTER — Observation Stay (HOSPITAL_COMMUNITY)
Admission: EM | Admit: 2021-07-04 | Discharge: 2021-07-08 | Disposition: A | Discharge status: Home Health | Payer: HMO | Attending: Internal Medicine | Admitting: Internal Medicine

## 2021-07-04 ENCOUNTER — Other Ambulatory Visit: Payer: Self-pay

## 2021-07-04 ENCOUNTER — Encounter (HOSPITAL_COMMUNITY): Payer: Self-pay

## 2021-07-04 ENCOUNTER — Emergency Department (HOSPITAL_COMMUNITY): Payer: HMO

## 2021-07-04 DIAGNOSIS — R55 Syncope and collapse: Principal | ICD-10-CM | POA: Diagnosis present

## 2021-07-04 DIAGNOSIS — Z794 Long term (current) use of insulin: Secondary | ICD-10-CM | POA: Insufficient documentation

## 2021-07-04 DIAGNOSIS — E1122 Type 2 diabetes mellitus with diabetic chronic kidney disease: Secondary | ICD-10-CM | POA: Diagnosis not present

## 2021-07-04 DIAGNOSIS — K219 Gastro-esophageal reflux disease without esophagitis: Secondary | ICD-10-CM | POA: Diagnosis present

## 2021-07-04 DIAGNOSIS — Z7982 Long term (current) use of aspirin: Secondary | ICD-10-CM | POA: Insufficient documentation

## 2021-07-04 DIAGNOSIS — R159 Full incontinence of feces: Secondary | ICD-10-CM | POA: Diagnosis not present

## 2021-07-04 DIAGNOSIS — I5030 Unspecified diastolic (congestive) heart failure: Secondary | ICD-10-CM | POA: Insufficient documentation

## 2021-07-04 DIAGNOSIS — I130 Hypertensive heart and chronic kidney disease with heart failure and stage 1 through stage 4 chronic kidney disease, or unspecified chronic kidney disease: Secondary | ICD-10-CM | POA: Diagnosis not present

## 2021-07-04 DIAGNOSIS — N183 Chronic kidney disease, stage 3 unspecified: Secondary | ICD-10-CM | POA: Diagnosis present

## 2021-07-04 DIAGNOSIS — F0390 Unspecified dementia without behavioral disturbance: Secondary | ICD-10-CM | POA: Insufficient documentation

## 2021-07-04 DIAGNOSIS — N1831 Chronic kidney disease, stage 3a: Secondary | ICD-10-CM | POA: Diagnosis present

## 2021-07-04 DIAGNOSIS — Z20822 Contact with and (suspected) exposure to covid-19: Secondary | ICD-10-CM | POA: Diagnosis not present

## 2021-07-04 DIAGNOSIS — I10 Essential (primary) hypertension: Secondary | ICD-10-CM | POA: Diagnosis present

## 2021-07-04 DIAGNOSIS — Z7901 Long term (current) use of anticoagulants: Secondary | ICD-10-CM | POA: Insufficient documentation

## 2021-07-04 DIAGNOSIS — Z853 Personal history of malignant neoplasm of breast: Secondary | ICD-10-CM | POA: Diagnosis not present

## 2021-07-04 DIAGNOSIS — I700 Atherosclerosis of aorta: Secondary | ICD-10-CM | POA: Diagnosis not present

## 2021-07-04 DIAGNOSIS — M19012 Primary osteoarthritis, left shoulder: Secondary | ICD-10-CM | POA: Diagnosis not present

## 2021-07-04 DIAGNOSIS — I2510 Atherosclerotic heart disease of native coronary artery without angina pectoris: Secondary | ICD-10-CM | POA: Diagnosis not present

## 2021-07-04 DIAGNOSIS — K449 Diaphragmatic hernia without obstruction or gangrene: Secondary | ICD-10-CM | POA: Diagnosis present

## 2021-07-04 DIAGNOSIS — N179 Acute kidney failure, unspecified: Secondary | ICD-10-CM | POA: Diagnosis present

## 2021-07-04 DIAGNOSIS — R410 Disorientation, unspecified: Secondary | ICD-10-CM | POA: Diagnosis not present

## 2021-07-04 DIAGNOSIS — R42 Dizziness and giddiness: Secondary | ICD-10-CM | POA: Diagnosis present

## 2021-07-04 DIAGNOSIS — M25519 Pain in unspecified shoulder: Secondary | ICD-10-CM

## 2021-07-04 DIAGNOSIS — I959 Hypotension, unspecified: Secondary | ICD-10-CM | POA: Diagnosis not present

## 2021-07-04 DIAGNOSIS — J80 Acute respiratory distress syndrome: Secondary | ICD-10-CM | POA: Diagnosis not present

## 2021-07-04 DIAGNOSIS — E875 Hyperkalemia: Secondary | ICD-10-CM | POA: Diagnosis present

## 2021-07-04 DIAGNOSIS — J4489 Other specified chronic obstructive pulmonary disease: Secondary | ICD-10-CM | POA: Diagnosis present

## 2021-07-04 DIAGNOSIS — J449 Chronic obstructive pulmonary disease, unspecified: Secondary | ICD-10-CM | POA: Diagnosis not present

## 2021-07-04 DIAGNOSIS — E785 Hyperlipidemia, unspecified: Secondary | ICD-10-CM | POA: Diagnosis present

## 2021-07-04 DIAGNOSIS — I25118 Atherosclerotic heart disease of native coronary artery with other forms of angina pectoris: Secondary | ICD-10-CM

## 2021-07-04 DIAGNOSIS — Z86711 Personal history of pulmonary embolism: Secondary | ICD-10-CM | POA: Diagnosis present

## 2021-07-04 DIAGNOSIS — E1169 Type 2 diabetes mellitus with other specified complication: Secondary | ICD-10-CM | POA: Diagnosis present

## 2021-07-04 DIAGNOSIS — C50919 Malignant neoplasm of unspecified site of unspecified female breast: Secondary | ICD-10-CM | POA: Diagnosis present

## 2021-07-04 HISTORY — DX: Syncope and collapse: R55

## 2021-07-04 LAB — CBC WITH DIFFERENTIAL/PLATELET
Abs Immature Granulocytes: 0.06 10*3/uL (ref 0.00–0.07)
Basophils Absolute: 0 10*3/uL (ref 0.0–0.1)
Basophils Relative: 0 %
Eosinophils Absolute: 0.3 10*3/uL (ref 0.0–0.5)
Eosinophils Relative: 2 %
HCT: 34.5 % — ABNORMAL LOW (ref 36.0–46.0)
Hemoglobin: 10.4 g/dL — ABNORMAL LOW (ref 12.0–15.0)
Immature Granulocytes: 0 %
Lymphocytes Relative: 21 %
Lymphs Abs: 2.9 10*3/uL (ref 0.7–4.0)
MCH: 28.9 pg (ref 26.0–34.0)
MCHC: 30.1 g/dL (ref 30.0–36.0)
MCV: 95.8 fL (ref 80.0–100.0)
Monocytes Absolute: 0.7 10*3/uL (ref 0.1–1.0)
Monocytes Relative: 5 %
Neutro Abs: 9.6 10*3/uL — ABNORMAL HIGH (ref 1.7–7.7)
Neutrophils Relative %: 72 %
Platelets: 290 10*3/uL (ref 150–400)
RBC: 3.6 MIL/uL — ABNORMAL LOW (ref 3.87–5.11)
RDW: 16.6 % — ABNORMAL HIGH (ref 11.5–15.5)
WBC: 13.6 10*3/uL — ABNORMAL HIGH (ref 4.0–10.5)
nRBC: 0 % (ref 0.0–0.2)

## 2021-07-04 LAB — COMPREHENSIVE METABOLIC PANEL
ALT: 15 U/L (ref 0–44)
AST: 15 U/L (ref 15–41)
Albumin: 3 g/dL — ABNORMAL LOW (ref 3.5–5.0)
Alkaline Phosphatase: 94 U/L (ref 38–126)
Anion gap: 9 (ref 5–15)
BUN: 57 mg/dL — ABNORMAL HIGH (ref 8–23)
CO2: 23 mmol/L (ref 22–32)
Calcium: 9.1 mg/dL (ref 8.9–10.3)
Chloride: 108 mmol/L (ref 98–111)
Creatinine, Ser: 1.68 mg/dL — ABNORMAL HIGH (ref 0.44–1.00)
GFR, Estimated: 30 mL/min — ABNORMAL LOW (ref >60)
Glucose, Bld: 98 mg/dL (ref 70–99)
Potassium: 5.1 mmol/L (ref 3.5–5.1)
Sodium: 140 mmol/L (ref 135–145)
Total Bilirubin: 0.6 mg/dL (ref 0.3–1.2)
Total Protein: 7.7 g/dL (ref 6.5–8.1)

## 2021-07-04 LAB — RESP PANEL BY RT-PCR (FLU A&B, COVID) ARPGX2
Influenza A by PCR: NEGATIVE
Influenza B by PCR: NEGATIVE
SARS Coronavirus 2 by RT PCR: NEGATIVE

## 2021-07-04 LAB — MAGNESIUM: Magnesium: 1.7 mg/dL (ref 1.7–2.4)

## 2021-07-04 LAB — BRAIN NATRIURETIC PEPTIDE: B Natriuretic Peptide: 70.1 pg/mL (ref 0.0–100.0)

## 2021-07-04 LAB — I-STAT VENOUS BLOOD GAS, ED
Acid-base deficit: 4 mmol/L — ABNORMAL HIGH (ref 0.0–2.0)
Bicarbonate: 20.8 mmol/L (ref 20.0–28.0)
Calcium, Ion: 1.17 mmol/L (ref 1.15–1.40)
HCT: 31 % — ABNORMAL LOW (ref 36.0–46.0)
Hemoglobin: 10.5 g/dL — ABNORMAL LOW (ref 12.0–15.0)
O2 Saturation: 58 %
Potassium: 5.9 mmol/L — ABNORMAL HIGH (ref 3.5–5.1)
Sodium: 140 mmol/L (ref 135–145)
TCO2: 22 mmol/L (ref 22–32)
pCO2, Ven: 37.4 mmHg — ABNORMAL LOW (ref 44–60)
pH, Ven: 7.353 (ref 7.25–7.43)
pO2, Ven: 31 mmHg — CL (ref 32–45)

## 2021-07-04 LAB — D-DIMER, QUANTITATIVE: D-Dimer, Quant: 5 ug/mL-FEU — ABNORMAL HIGH (ref 0.00–0.50)

## 2021-07-04 LAB — HEMOGLOBIN A1C
Hgb A1c MFr Bld: 7 % — ABNORMAL HIGH (ref 4.8–5.6)
Mean Plasma Glucose: 154.2 mg/dL

## 2021-07-04 LAB — TROPONIN I (HIGH SENSITIVITY)
Troponin I (High Sensitivity): 14 ng/L (ref <18)
Troponin I (High Sensitivity): 12 ng/L (ref <18)

## 2021-07-04 LAB — CBG MONITORING, ED: Glucose-Capillary: 187 mg/dL — ABNORMAL HIGH (ref 70–99)

## 2021-07-04 LAB — CK: Total CK: 48 U/L (ref 38–234)

## 2021-07-04 MED ORDER — DULOXETINE HCL 60 MG PO CPEP
60.0000 mg | ORAL_CAPSULE | Freq: Every day | ORAL | Status: DC
Start: 2021-07-05 — End: 2021-07-08
  Administered 2021-07-05 – 2021-07-08 (×4): 60 mg via ORAL
  Filled 2021-07-04 (×4): qty 1

## 2021-07-04 MED ORDER — HYDRALAZINE HCL 25 MG PO TABS: 25.0000 mg | ORAL_TABLET | Freq: Four times a day (QID) | ORAL | Status: DC | PRN

## 2021-07-04 MED ORDER — DICLOFENAC SODIUM 1 % EX GEL
2.0000 g | Freq: Four times a day (QID) | CUTANEOUS | Status: DC
  Administered 2021-07-05 – 2021-07-07 (×9): 2 g via TOPICAL
  Filled 2021-07-04 (×2): qty 100

## 2021-07-04 MED ORDER — FLUTICASONE FUROATE-VILANTEROL 200-25 MCG/ACT IN AEPB
1.0000 | INHALATION_SPRAY | Freq: Every day | RESPIRATORY_TRACT | Status: DC
  Administered 2021-07-05 – 2021-07-08 (×4): 1 via RESPIRATORY_TRACT
  Filled 2021-07-04: qty 28

## 2021-07-04 MED ORDER — FLUTICASONE PROPIONATE 50 MCG/ACT NA SUSP
1.0000 | Freq: Every day | NASAL | Status: DC
  Administered 2021-07-05: 1 via NASAL
  Filled 2021-07-04: qty 16

## 2021-07-04 MED ORDER — ATORVASTATIN CALCIUM 80 MG PO TABS
80.0000 mg | ORAL_TABLET | Freq: Every day | ORAL | Status: DC
Start: 2021-07-05 — End: 2021-07-08
  Administered 2021-07-05 – 2021-07-08 (×4): 80 mg via ORAL
  Filled 2021-07-04 (×4): qty 1

## 2021-07-04 MED ORDER — ENOXAPARIN SODIUM 30 MG/0.3ML IJ SOSY
30.0000 mg | PREFILLED_SYRINGE | INTRAMUSCULAR | Status: DC
  Administered 2021-07-05: 30 mg via SUBCUTANEOUS
  Filled 2021-07-04: qty 0.3

## 2021-07-04 MED ORDER — ALPRAZOLAM 0.25 MG PO TABS
0.2500 mg | ORAL_TABLET | Freq: Once | ORAL | Status: AC
  Administered 2021-07-04: 0.25 mg via ORAL
  Filled 2021-07-04: qty 1

## 2021-07-04 MED ORDER — ANASTROZOLE 1 MG PO TABS
1.0000 mg | ORAL_TABLET | Freq: Every day | ORAL | Status: DC
Start: 2021-07-05 — End: 2021-07-08
  Administered 2021-07-06 – 2021-07-08 (×3): 1 mg via ORAL
  Filled 2021-07-04 (×4): qty 1

## 2021-07-04 MED ORDER — ALBUTEROL SULFATE HFA 108 (90 BASE) MCG/ACT IN AERS
1.0000 | INHALATION_SPRAY | Freq: Four times a day (QID) | RESPIRATORY_TRACT | Status: DC | PRN
Start: 2021-07-04 — End: 2021-07-04

## 2021-07-04 MED ORDER — CLOPIDOGREL BISULFATE 75 MG PO TABS
75.0000 mg | ORAL_TABLET | Freq: Every day | ORAL | Status: DC
Start: 2021-07-05 — End: 2021-07-08
  Administered 2021-07-05 – 2021-07-08 (×4): 75 mg via ORAL
  Filled 2021-07-04 (×4): qty 1

## 2021-07-04 MED ORDER — INSULIN GLARGINE-YFGN 100 UNIT/ML ~~LOC~~ SOLN
20.0000 [IU] | Freq: Every day | SUBCUTANEOUS | Status: DC
  Administered 2021-07-05 – 2021-07-08 (×4): 20 [IU] via SUBCUTANEOUS
  Filled 2021-07-04 (×4): qty 0.2

## 2021-07-04 MED ORDER — SODIUM CHLORIDE 0.9% FLUSH
3.0000 mL | Freq: Two times a day (BID) | INTRAVENOUS | Status: DC
  Administered 2021-07-05 – 2021-07-08 (×6): 3 mL via INTRAVENOUS

## 2021-07-04 MED ORDER — LINACLOTIDE 145 MCG PO CAPS
145.0000 ug | ORAL_CAPSULE | Freq: Every day | ORAL | Status: DC
  Administered 2021-07-05 – 2021-07-08 (×3): 145 ug via ORAL
  Filled 2021-07-04 (×4): qty 1

## 2021-07-04 MED ORDER — PANTOPRAZOLE SODIUM 20 MG PO TBEC
20.0000 mg | DELAYED_RELEASE_TABLET | Freq: Every day | ORAL | Status: DC
Start: 2021-07-05 — End: 2021-07-08
  Administered 2021-07-05 – 2021-07-08 (×4): 20 mg via ORAL
  Filled 2021-07-04 (×4): qty 1

## 2021-07-04 MED ORDER — ALBUTEROL SULFATE (2.5 MG/3ML) 0.083% IN NEBU: 2.5000 mg | INHALATION_SOLUTION | Freq: Four times a day (QID) | RESPIRATORY_TRACT | Status: DC | PRN

## 2021-07-04 MED ORDER — INSULIN ASPART 100 UNIT/ML IJ SOLN: 0.0000 [IU] | Freq: Every day | INTRAMUSCULAR | Status: DC

## 2021-07-04 MED ORDER — INSULIN ASPART 100 UNIT/ML IJ SOLN
0.0000 [IU] | Freq: Three times a day (TID) | INTRAMUSCULAR | Status: DC
  Administered 2021-07-05: 3 [IU] via SUBCUTANEOUS
  Administered 2021-07-05: 4 [IU] via SUBCUTANEOUS
  Administered 2021-07-06: 3 [IU] via SUBCUTANEOUS
  Administered 2021-07-06 – 2021-07-07 (×3): 4 [IU] via SUBCUTANEOUS
  Administered 2021-07-08: 3 [IU] via SUBCUTANEOUS

## 2021-07-04 MED ORDER — ASPIRIN EC 81 MG PO TBEC
81.0000 mg | DELAYED_RELEASE_TABLET | Freq: Every day | ORAL | Status: DC
  Administered 2021-07-05 – 2021-07-08 (×4): 81 mg via ORAL
  Filled 2021-07-04 (×4): qty 1

## 2021-07-04 MED ORDER — MEMANTINE HCL 10 MG PO TABS
5.0000 mg | ORAL_TABLET | Freq: Every day | ORAL | Status: DC
  Administered 2021-07-05 – 2021-07-08 (×4): 5 mg via ORAL
  Filled 2021-07-04 (×4): qty 1

## 2021-07-04 MED ORDER — POLYSACCHARIDE IRON COMPLEX 150 MG PO CAPS
150.0000 mg | ORAL_CAPSULE | Freq: Every day | ORAL | Status: DC
Start: 2021-07-05 — End: 2021-07-08
  Administered 2021-07-05 – 2021-07-08 (×4): 150 mg via ORAL
  Filled 2021-07-04 (×4): qty 1

## 2021-07-04 MED ORDER — LORATADINE 10 MG PO TABS
10.0000 mg | ORAL_TABLET | Freq: Every day | ORAL | Status: DC
Start: 2021-07-05 — End: 2021-07-08
  Administered 2021-07-05 – 2021-07-08 (×4): 10 mg via ORAL
  Filled 2021-07-04 (×4): qty 1

## 2021-07-04 MED ORDER — MECLIZINE HCL 25 MG PO TABS
12.5000 mg | ORAL_TABLET | Freq: Three times a day (TID) | ORAL | Status: DC | PRN
  Administered 2021-07-05: 12.5 mg via ORAL
  Filled 2021-07-04: qty 1

## 2021-07-04 MED ORDER — FEBUXOSTAT 40 MG PO TABS
40.0000 mg | ORAL_TABLET | Freq: Every day | ORAL | Status: DC
Start: 2021-07-05 — End: 2021-07-08
  Administered 2021-07-05 – 2021-07-08 (×4): 40 mg via ORAL
  Filled 2021-07-04 (×4): qty 1

## 2021-07-04 NOTE — ED Provider Notes (Signed)
[facility] EMERGENCY DEPARTMENT Provider Note   CSN: [id number] Arrival date & time: 07/04/21  1244     History  Chief Complaint  Patient presents with   Loss of Consciousness    [name] is a 87 y.o. female.  HPI Patient presenting for syncope.  Per chart review, medical history includes HLD, HTN, CAD, CHF, COPD, CKD, obesity, GERD, T2DM, OSA, anxiety, depression.  Syncopal episode occurred shortly prior to arrival.  Patient was at home in the shower.  She has a home health aide that comes over every other day to assist with bathing.  Home health aide was assisting her at this time.  She was on a chair in the shower and had a witnessed LOC.  Patient does not remember this episode and does not remember any prodromal symptoms.  EMS was called.  EMS was not able to obtain blood pressures while the patient was sitting up.  When laying supine, patient had blood pressure of 100/70.  Blood sugar was stable at 125.  Currently, patient feels at her baseline and denies any current symptoms.    Home Medications Prior to Admission medications   Medication Sig Start Date End Date Taking? Authorizing Provider  albuterol (PROAIR HFA) 108 (90 Base) MCG/ACT inhaler Inhale 1-2 puffs into the lungs every 6 (six) hours as needed for wheezing or shortness of breath. 12/19/18   [name], NP  albuterol (PROVENTIL) (2.5 MG/3ML) 0.083% nebulizer solution Take 3 mLs (2.5 mg total) by nebulization every 6 (six) hours as needed for wheezing or shortness of breath. 12/17/20   Ngetich, Dinah C, NP  anastrozole (ARIMIDEX) 1 MG tablet TAKE 1 TABLET BY MOUTH EVERY DAY 04/27/21   [name], MD  ASPIRIN LOW DOSE 81 MG EC tablet TAKE 1 TABLET BY MOUTH EVERY DAY 04/21/21   [name], NP  atorvastatin (LIPITOR) 80 MG tablet TAKE 1 TABLET BY MOUTH EVERY DAY 02/07/21   [name], NP  budesonide-formoterol (SYMBICORT) 160-4.5 MCG/ACT inhaler Inhale 2 puffs into the  lungs 2 (two) times daily. 01/26/21   [name], NP  carvedilol (COREG) 25 MG tablet Take 1 tablet (25 mg total) by mouth 2 (two) times daily. 05/18/21   Freada Bergeron, MD  cetirizine (ZYRTEC) 10 MG tablet TAKE 1 TABLET BY MOUTH EVERY DAY 05/30/21   [name], NP  clopidogrel (PLAVIX) 75 MG tablet Take 1 tablet (75 mg total) by mouth daily. 05/26/21   Freada Bergeron, MD  Continuous Blood Gluc Sensor (FREESTYLE LIBRE 14 DAY SENSOR) MISC USE TO INJECT 1 DEVICE INTO THE SKIN AS DIRECTED 04/27/21   Shamleffer, [name], MD  Cyanocobalamin (VITAMIN B-12 PO) Take 1 tablet by mouth once a week.     [provider]  diclofenac Sodium (VOLTAREN) 1 % GEL Apply 2 g topically 4 (four) times daily. 05/13/21   [name], NP  DULoxetine (CYMBALTA) 60 MG capsule Take one capsule by mouth once daily for anxiety. 04/21/21   [name], NP  febuxostat (ULORIC) 40 MG tablet Take 1 tablet (40 mg total) by mouth daily. 04/21/21   [name], NP  fluticasone (FLONASE) 50 MCG/ACT nasal spray Place 1 spray into both nostrils in the morning and at bedtime. 12/17/20   Ngetich, Dinah C, NP  furosemide (LASIX) 20 MG tablet Take 1 tablet (20 mg total) by mouth daily as needed. 05/30/21   [name], NP  guaiFENesin [facility])  600 MG 12 hr tablet Take 1 tablet (600 mg total) by mouth 2 (two) times daily as needed. 03/14/21   Medina-Vargas, Monina C, NP  insulin glargine, 2 Unit Dial, (TOUJEO MAX SOLOSTAR) 300 UNIT/ML Solostar Pen Inject 42 Units into the skin daily. Patient assistance program provides 05/24/21   Shamleffer, [name], MD  insulin lispro (HUMALOG KWIKPEN) 100 UNIT/ML KwikPen Max daily 40 units 05/25/21   Shamleffer, [name], MD  iron polysaccharides (NIFEREX) 150 MG capsule Take 1 capsule (150 mg total) by mouth daily. 03/09/21   [name], MD  isosorbide mononitrate (IMDUR) 60 MG 24 hr tablet Take 1 tablet (60 mg total) by  mouth daily. 04/21/21   [name], NP  linaclotide Rolan Lipa) 145 MCG CAPS capsule Take 1 capsule (145 mcg total) by mouth daily before breakfast. 03/14/21   Medina-Vargas, Monina C, NP  meclizine (ANTIVERT) 25 MG tablet TAKE 1 TABLET BY MOUTH THREE TIMES DAILY AS NEEDED FOR DIZZINESS 09/12/19   [name], NP  memantine [facility]) 5 MG tablet Take 2 by mouth in the am and 1 by mouth in the pm 09/22/20   [name], NP  nitroGLYCERIN (NITROSTAT) 0.4 MG SL tablet Take one tablet under the tongue every 5 minutes as needed for chest pain 09/24/13   [name], NP  nystatin cream (MYCOSTATIN) Apply 1 application topically 2 (two) times daily. 12/17/20   Ngetich, Dinah C, NP  oxyCODONE (OXY IR/ROXICODONE) 5 MG immediate release tablet Take 1 tablet (5 mg total) by mouth every 12 (twelve) hours as needed for severe pain. Must last 30 days. 06/26/21 07/26/21  Gillis Santa, MD  pantoprazole (PROTONIX) 20 MG tablet Take 1 tablet (20 mg total) by mouth daily. 05/27/21   [name], NP  polyethylene glycol powder (MIRALAX) 17 GM/SCOOP powder 17 gm mix with liquid twice daily 03/14/21   Medina-Vargas, Monina C, NP  sodium polystyrene (SPS) 15 GM/60ML suspension TAKE 35mls BY MOUTH ONCE A WEEK TO keep potassium down 04/21/21   [name], NP  topiramate (TOPAMAX) 25 MG tablet Take 1 tablet (25 mg total) by mouth daily. 04/21/21   [name], NP  Vitamin D, Ergocalciferol, (DRISDOL) 1.25 MG (50000 UNIT) CAPS capsule TAKE 1 CAPSULE BY MOUTH ONCE WEEKLY 05/30/21   [name], MD      Allergies    Sulfonamide derivatives, Aricept [donepezil hcl], and Tramadol    Review of Systems   Review of Systems  Neurological:  Positive for syncope.  All other systems reviewed and are negative.  Physical Exam Updated Vital Signs BP 132/75    Pulse 66    Resp 13    SpO2 100%  Physical Exam Vitals and nursing note reviewed.  Constitutional:      General: She is not  in acute distress.    Appearance: Normal appearance. She is well-developed. She is obese. She is not ill-appearing, toxic-appearing or diaphoretic.  HENT:     Head: Normocephalic and atraumatic.     Right Ear: External ear normal.     Left Ear: External ear normal.     Nose: Nose normal.     Mouth/Throat:     Mouth: Mucous membranes are moist.     Pharynx: Oropharynx is clear.  Eyes:     Extraocular Movements: Extraocular movements intact.     Conjunctiva/sclera: Conjunctivae normal.  Cardiovascular:     Rate and Rhythm: Normal rate and regular rhythm.  Heart sounds: No murmur heard. Pulmonary:     Effort: Pulmonary effort is normal. No respiratory distress.     Breath sounds: Normal breath sounds. No wheezing or rales.  Abdominal:     Palpations: Abdomen is soft.     Tenderness: There is no abdominal tenderness.  Musculoskeletal:        General: No swelling.     Cervical back: Normal range of motion and neck supple.     Right lower leg: No edema.     Left lower leg: No edema.  Skin:    General: Skin is warm and dry.     Capillary Refill: Capillary refill takes less than 2 seconds.     Coloration: Skin is not jaundiced or pale.  Neurological:     General: No focal deficit present.     Mental Status: She is alert and oriented to person, place, and time.  Psychiatric:        Mood and Affect: Mood normal.        Behavior: Behavior normal.        Thought Content: Thought content normal.        Judgment: Judgment normal.    ED Results / Procedures / Treatments   Labs (all labs ordered are listed, but only abnormal results are displayed) Labs Reviewed  CBC WITH DIFFERENTIAL/PLATELET - Abnormal; Notable for the following components:      Result Value   WBC 13.6 (*)    RBC 3.60 (*)    Hemoglobin 10.4 (*)    HCT 34.5 (*)    RDW 16.6 (*)    Neutro Abs 9.6 (*)    All other components within normal limits  COMPREHENSIVE METABOLIC PANEL - Abnormal; Notable for the  following components:   BUN 57 (*)    Creatinine, Ser 1.68 (*)    Albumin 3.0 (*)    GFR, Estimated 30 (*)    All other components within normal limits  RESP PANEL BY RT-PCR (FLU A&B, COVID) ARPGX2  MAGNESIUM  BRAIN NATRIURETIC PEPTIDE  URINALYSIS, ROUTINE W REFLEX MICROSCOPIC  CBG MONITORING, ED  TROPONIN I (HIGH SENSITIVITY)  TROPONIN I (HIGH SENSITIVITY)    EKG EKG Interpretation  Date/Time:  Monday July 04 2021 13:24:34 EST Ventricular Rate:  74 PR Interval:  208 QRS Duration: 102 QT Interval:  416 QTC Calculation: 462 R Axis:   4 Text Interpretation: Sinus rhythm Abnormal R-wave progression, early transition Abnormal inferior Q waves Nonspecific T abnormalities, lateral leads Confirmed by Godfrey Pick [phone]) on 07/04/2021 1:36:03 PM  Radiology CT HEAD WO CONTRAST  Result Date: 07/04/2021 CLINICAL DATA:  Syncope. EXAM: CT HEAD WITHOUT CONTRAST TECHNIQUE: Contiguous axial images were obtained from the base of the skull through the vertex without intravenous contrast. RADIATION DOSE REDUCTION: This exam was performed according to the departmental dose-optimization program which includes automated exposure control, adjustment of the mA and/or kV according to patient size and/or use of iterative reconstruction technique. COMPARISON:  CT 08/27/2018 FINDINGS: Brain: No acute intracranial hemorrhage. No focal mass lesion. No CT evidence of acute infarction. No midline shift or mass effect. No hydrocephalus. Basilar cisterns are patent. There are periventricular and subcortical white matter hypodensities. Generalized cortical atrophy. Vascular: No hyperdense vessel or unexpected calcification. Skull: Normal. Negative for fracture or focal lesion. Sinuses/Orbits: Paranasal sinuses and mastoid air cells are clear. Orbits are clear. Other: None. IMPRESSION: 1. No acute intracranial findings. 2. Extensive white matter microvascular disease. 3. Mild atrophy Electronically Signed   By: Nicole Kindred  Leonia Reeves M.D.   On: 07/04/2021 15:04   CT Chest Wo Contrast  Result Date: 07/04/2021 CLINICAL DATA:  Syncope EXAM: CT CHEST WITHOUT CONTRAST TECHNIQUE: Multidetector CT imaging of the chest was performed following the standard protocol without IV contrast. RADIATION DOSE REDUCTION: This exam was performed according to the departmental dose-optimization program which includes automated exposure control, adjustment of the mA and/or kV according to patient size and/or use of iterative reconstruction technique. COMPARISON:  07/04/2021, 06/14/2015 FINDINGS: Cardiovascular: Limited unenhanced imaging of the heart and great vessels demonstrates no pericardial effusion. There is extensive atherosclerosis throughout the coronary vasculature. Normal caliber of the thoracic aorta, with mild atherosclerosis of the aortic arch. Evaluation of the vascular lumen is limited without IV contrast. Mediastinum/Nodes: No enlarged mediastinal or axillary lymph nodes. Thyroid gland, trachea, and esophagus demonstrate no significant findings. Stable postsurgical changes at the gastroesophageal junction, with small hiatal hernia. Lungs/Pleura: No acute airspace disease, effusion, or pneumothorax. Central airways are patent. Upper Abdomen: No acute abnormality. Musculoskeletal: No acute or destructive bony lesions. Reconstructed images demonstrate no additional findings. IMPRESSION: 1. No acute intrathoracic process. 2. Aortic Atherosclerosis (ICD10-I70.0). Coronary artery atherosclerosis. Electronically Signed   By: Randa Ngo M.D.   On: 07/04/2021 15:03   DG Chest Port 1 View  Result Date: 07/04/2021 CLINICAL DATA:  Syncope EXAM: PORTABLE CHEST 1 VIEW COMPARISON:  03/03/2020 FINDINGS: Apparent shift of mediastinum to the right may be due to rotation. Transverse diameter of heart is increased. Central pulmonary vessels are more prominent. There is poor inspiration. Interstitial markings in the parahilar regions and lower lung  fields are prominent. Costophrenic angles are clear. There is no pneumothorax. IMPRESSION: Central pulmonary vessels are more prominent. There is poor inspiration. Increased interstitial markings are seen in the parahilar regions and lower lung fields suggesting interstitial edema or interstitial pneumonia. There is no focal consolidation in the peripheral lung fields. Electronically Signed   By: Elmer Picker M.D.   On: 07/04/2021 13:16    Procedures Procedures    Medications Ordered in ED Medications - No data to display  ED Course/ Medical Decision Making/ A&P                           Medical Decision Making Amount and/or Complexity of Data Reviewed Labs: ordered. Radiology: ordered. ECG/medicine tests: ordered.   This patient presents to the ED for concern of syncope, this involves an extensive number of treatment options, and is a complaint that carries with it a high risk of complications and morbidity.  The differential diagnosis includes vasovagal syncope, arrhythmia, symptomatic anemia, electrolyte abnormalities, heart failure, hypoxia   Co morbidities that complicate the patient evaluation  HLD, HTN, CAD, CHF, COPD, CKD, obesity, GERD, T2DM, OSA, anxiety, depression   Additional history obtained:  Additional history obtained from EMS External records from outside source obtained and reviewed including EMR   Lab Tests:  I Ordered, and personally interpreted labs.  The pertinent results include: Anemia improved from baseline, leukocytosis present, normal electrolytes, baseline CKD, normal troponin   Imaging Studies ordered:  I ordered imaging studies including chest x-ray, CT head, CT chest I independently visualized and interpreted imaging which showed chest x-ray showed increased interstitial markings which prompted a CT scan of chest.  Results of CT scans pending at time of signout. I agree with the radiologist interpretation   Cardiac Monitoring:  The  patient was maintained on a cardiac monitor.  I personally viewed and interpreted  the cardiac monitored which showed an underlying rhythm of: Sinus rhythm  Problem List / ED Course:  Patient with multiple chronic medical conditions, presenting for a witnessed syncopal episode prior to arrival.  This occurred while seated in the shower.  Home health aide was assisting her with bathing and did witness the episode.  Patient is amnestic to the event and does not recall any prodrome.  She currently feels at her mental and physical baseline.  EMS noted low blood pressures, lower while seated.  When laying down, BP was 100/70 with EMS.  On arrival in the ED, patient's blood pressures continued to be soft.  She does take Imdur, Coreg, and Lasix but is unsure of when she takes these.  She states that her daughter manages her medications.  Patient to undergo ED work-up.  Care of patient was signed out to oncoming ED provider.   Reevaluation:  After the interventions noted above, I reevaluated the patient and found that they have :stayed the same   Social Determinants of Health:  Bedbound   Dispostion:  After consideration of the diagnostic results and the patients response to treatment, I feel that the patent would benefit from reassessment.          Final Clinical Impression(s) / ED Diagnoses Final diagnoses:  Syncope, unspecified syncope type    Rx / DC Orders ED Discharge Orders     None         Godfrey Pick, MD 07/04/21 1645

## 2021-07-04 NOTE — ED Triage Notes (Signed)
Pt arrives via EMS. Pt was taking a shower in a shower chair pta. During the shower, she had a syncopal episode. A caregiver was with her during the episode. No injury noted. Blood sugar was 125. Pt is currently AxOx4.

## 2021-07-04 NOTE — ED Notes (Signed)
Pt reported bed wet. Linens changed, purewick adjust and suction assured prior to leaving room.

## 2021-07-04 NOTE — H&P (Signed)
History and Physical    KAYLEANA WAITES DEY:[id number] DOB: November 16, 1934 DOA: 07/04/2021  PCP: [name], NP (Confirm with patient/family/NH records and if not entered, this has to be entered at [facility] point of entry) Patient coming from: Home  I have personally briefly reviewed patient's old medical records in [place]  Chief Complaint: I passed out  HPI: KAELYNN IGO is a 87 y.o. female with medical history significant of IDDM, COPD, CAD with stenting, chronic HFpEF, history of PE of anticoagulation due to worsening of iron deficiency anemia in 2021, COPD, breast CA on hormone therapy CKD stage IIIa, morbid obesity, multiple OA, gout, chronic ambulation dysfunction, diabetic neuropathy, chronic dizziness on meclizine, presented with syncope.  Patient was at shower this afternoon, then started to feel dizzy and then lumped back and passed out.  Daughter found patient "In and out of her mentation for 20-30 minutes" before coming back to baseline mentations.  During which time patient was able to recognize family members but appears to be confused and unable to talk but only mumbling.  Patient reported feeling of global headache right now.  Denies any chest pain no shortness of breath no cough no urinary problems, no diarrhea.  She has been eating and drinking well, she went to local restaurant last night and ate, denied any nauseous vomiting or possibility of dehydration overnight.  At baseline, patient reported she has been feeling extremely tired for almost a year, for which she has been following with her PCP but no significant etiology was found.  Her sugar has been controlled, fingerstick > 100 always, no recent hypoglycemia as per patient daughter.  She takes multiple BP meds, and her sister has been taking care of giving all her meds to her.  She has chronic hip and knee pains, but she does not take narcotics often.  She has COPD and uses maintenance Pulmicort.  Denies any cough  or wheezing recently.  ED Course: Blood pressure 96/64, no tachycardia, afebrile.  No hypoxia.  CT head negative for acute findings, chest x-ray no acute infiltrates.  WBC 13.6, hemoglobin 10.4, creatinine 1.6 above baseline, BUN 57.  Review of Systems: As per HPI otherwise 14 point review of systems negative.    Past Medical History:  Diagnosis Date   Allergy    takes Mucinex daily as needed   Anemia, unspecified    Anxiety    takes Clonazepam daily as needed   Arthritis    Breast cancer ([place])    Chronic diastolic CHF (congestive heart failure) (HCC)    takes Furosemide daily   CKD (chronic kidney disease), stage III ([place])    Coronary artery disease    a. s/p IMI 2004 tx with BMS to RCA;  b. s/p Promus DES to RCA 2/12 c. abnormal nuc 2016 -> cath with med rx. d. NSTEMI 02/2020  mv CAD with severe ISR in pRCA and m/dRCA lesion both treated with DES.   Dementia ([place])    Depression    takes Cymbalta daily   Diabetes mellitus    insulin daily   GERD (gastroesophageal reflux disease)    takes Protonix daily   Gout, unspecified    Headache    occasionally   History of blood clots    Hyperlipidemia    takes Pravastatin daily   Hypertension    Insomnia    Joint pain    Joint swelling    Morbid obesity ([place])    Myocardial infarction [facility]) 2004  Non-STEMI (non-ST elevated myocardial infarction) ([place]) 02/15/2020   Numbness    Obstructive sleep apnea    does not wear cpap   Osteoarthritis    Osteoarthritis    Osteoarthrosis, unspecified whether generalized or localized, lower leg    Pain, chronic    Polymyalgia rheumatica ([place])    Pulmonary emboli ([place]) 01/2012   felt to need lifelong anticoagulation but Xarelto dc in 02/2020 due to need for DAPT   Urinary incontinence    takes Linzess daily   Vertigo    hx of;was taking Meclizine if needed   Wears dentures     Past Surgical History:  Procedure Laterality Date   ABDOMINAL HYSTERECTOMY     partial   APPENDECTOMY      blood clots/legs and lungs  2013   BREAST BIOPSY Left 07/22/2014   BREAST BIOPSY Left 02/10/2013   BREAST LUMPECTOMY Left 11/05/2014   BREAST LUMPECTOMY WITH RADIOACTIVE SEED LOCALIZATION Left 11/05/2014   Procedure: LEFT BREAST LUMPECTOMY WITH RADIOACTIVE SEED LOCALIZATION;  Surgeon: [name], MD;  Location: [place];  Service: General;  Laterality: Left;   CARDIAC CATHETERIZATION     COLONOSCOPY     CORONARY ANGIOPLASTY  2   CORONARY STENT INTERVENTION N/A 02/17/2020   Procedure: CORONARY STENT INTERVENTION;  Surgeon: Leonie Man, MD;  Location: [place] CV [facility];  Service: Cardiovascular;  Laterality: N/A;   ESOPHAGOGASTRODUODENOSCOPY (EGD) WITH PROPOFOL N/A 11/07/2016   Procedure: ESOPHAGOGASTRODUODENOSCOPY (EGD) WITH PROPOFOL;  Surgeon: Gatha Mayer, MD;  Location: [facility];  Service: Endoscopy;  Laterality: N/A;   EXCISION OF SKIN TAG Right 11/05/2014   Procedure: EXCISION OF RIGHT EYELID SKIN TAG;  Surgeon: [name], MD;  Location: [place];  Service: General;  Laterality: Right;   EYE SURGERY Bilateral    cataract    GASTRIC BYPASS  1977    reversed in 1979, [place] CATH AND CORONARY ANGIOGRAPHY N/A 02/17/2020   Procedure: LEFT HEART CATH AND CORONARY ANGIOGRAPHY;  Surgeon: Leonie Man, MD;  Location: [place] CV [facility];  Service: Cardiovascular;  Laterality: N/A;   LEFT HEART CATHETERIZATION WITH CORONARY ANGIOGRAM N/A 06/29/2014   Procedure: LEFT HEART CATHETERIZATION WITH CORONARY ANGIOGRAM;  Surgeon: Troy Sine, MD;  Location: [facility] CATH LAB;  Service: Cardiovascular;  Laterality: N/A;   MEMBRANE PEEL Right 10/23/2018   Procedure: MEMBRANE PEEL;  Surgeon: Hurman Horn, MD;  Location: [place];  Service: Ophthalmology;  Laterality: Right;   MI with stent placement  2004   PARS PLANA VITRECTOMY Right 10/23/2018   Procedure: PARS PLANA VITRECTOMY WITH 25 GAUGE;  Surgeon: Hurman Horn, MD;  Location: [place];  Service: Ophthalmology;   Laterality: Right;     reports that she has never smoked. She has been exposed to tobacco smoke. She has never used smokeless tobacco. She reports that she does not drink alcohol and does not use drugs.  Allergies  Allergen Reactions   Sulfonamide Derivatives Swelling    Mouth swelling   Aricept [Donepezil Hcl] Diarrhea and Nausea And Vomiting    GI upset/loose stools   Tramadol Nausea And Vomiting    Family History  Problem Relation Age of Onset   Breast cancer Mother 53   Heart disease Mother    Throat cancer Father    Hypertension Father    Arthritis Father    Diabetes Father    Arthritis Sister    Obesity Sister    Diabetes Sister    Heart  disease Cousin    Colon cancer Neg Hx    Stomach cancer Neg Hx    Esophageal cancer Neg Hx      Prior to Admission medications   Medication Sig Start Date End Date Taking? Authorizing Provider  albuterol (PROAIR HFA) 108 (90 Base) MCG/ACT inhaler Inhale 1-2 puffs into the lungs every 6 (six) hours as needed for wheezing or shortness of breath. 12/19/18   [name], NP  albuterol (PROVENTIL) (2.5 MG/3ML) 0.083% nebulizer solution Take 3 mLs (2.5 mg total) by nebulization every 6 (six) hours as needed for wheezing or shortness of breath. 12/17/20   Ngetich, Dinah C, NP  anastrozole (ARIMIDEX) 1 MG tablet TAKE 1 TABLET BY MOUTH EVERY DAY 04/27/21   [name], MD  ASPIRIN LOW DOSE 81 MG EC tablet TAKE 1 TABLET BY MOUTH EVERY DAY 04/21/21   [name], NP  atorvastatin (LIPITOR) 80 MG tablet TAKE 1 TABLET BY MOUTH EVERY DAY 02/07/21   [name], NP  budesonide-formoterol (SYMBICORT) 160-4.5 MCG/ACT inhaler Inhale 2 puffs into the lungs 2 (two) times daily. 01/26/21   [name], NP  carvedilol (COREG) 25 MG tablet Take 1 tablet (25 mg total) by mouth 2 (two) times daily. 05/18/21   Freada Bergeron, MD  cetirizine (ZYRTEC) 10 MG tablet TAKE 1 TABLET BY MOUTH EVERY DAY 05/30/21   [name],  NP  clopidogrel (PLAVIX) 75 MG tablet Take 1 tablet (75 mg total) by mouth daily. 05/26/21   Freada Bergeron, MD  Continuous Blood Gluc Sensor (FREESTYLE LIBRE 14 DAY SENSOR) MISC USE TO INJECT 1 DEVICE INTO THE SKIN AS DIRECTED 04/27/21   Shamleffer, [name], MD  Cyanocobalamin (VITAMIN B-12 PO) Take 1 tablet by mouth once a week.     [provider]  diclofenac Sodium (VOLTAREN) 1 % GEL Apply 2 g topically 4 (four) times daily. 05/13/21   [name], NP  DULoxetine (CYMBALTA) 60 MG capsule Take one capsule by mouth once daily for anxiety. 04/21/21   [name], NP  febuxostat (ULORIC) 40 MG tablet Take 1 tablet (40 mg total) by mouth daily. 04/21/21   [name], NP  fluticasone (FLONASE) 50 MCG/ACT nasal spray Place 1 spray into both nostrils in the morning and at bedtime. 12/17/20   Ngetich, Dinah C, NP  furosemide (LASIX) 20 MG tablet Take 1 tablet (20 mg total) by mouth daily as needed. 05/30/21   [name], NP  guaiFENesin (MUCINEX) 600 MG 12 hr tablet Take 1 tablet (600 mg total) by mouth 2 (two) times daily as needed. 03/14/21   Medina-Vargas, Monina C, NP  insulin glargine, 2 Unit Dial, (TOUJEO MAX SOLOSTAR) 300 UNIT/ML Solostar Pen Inject 42 Units into the skin daily. Patient assistance program provides 05/24/21   Shamleffer, [name], MD  insulin lispro (HUMALOG KWIKPEN) 100 UNIT/ML KwikPen Max daily 40 units 05/25/21   Shamleffer, [name], MD  iron polysaccharides (NIFEREX) 150 MG capsule Take 1 capsule (150 mg total) by mouth daily. 03/09/21   [name], MD  isosorbide mononitrate (IMDUR) 60 MG 24 hr tablet Take 1 tablet (60 mg total) by mouth daily. 04/21/21   [name], NP  linaclotide Rolan Lipa) 145 MCG CAPS capsule Take 1 capsule (145 mcg total) by mouth daily before breakfast. 03/14/21   Medina-Vargas, Monina C, NP  meclizine (ANTIVERT) 25 MG tablet TAKE 1 TABLET BY MOUTH THREE TIMES DAILY AS NEEDED FOR  DIZZINESS  09/12/19   [name], NP  memantine [facility]) 5 MG tablet Take 2 by mouth in the am and 1 by mouth in the pm 09/22/20   [name], NP  nitroGLYCERIN (NITROSTAT) 0.4 MG SL tablet Take one tablet under the tongue every 5 minutes as needed for chest pain 09/24/13   [name], NP  nystatin cream (MYCOSTATIN) Apply 1 application topically 2 (two) times daily. 12/17/20   Ngetich, Dinah C, NP  oxyCODONE (OXY IR/ROXICODONE) 5 MG immediate release tablet Take 1 tablet (5 mg total) by mouth every 12 (twelve) hours as needed for severe pain. Must last 30 days. 06/26/21 07/26/21  Gillis Santa, MD  pantoprazole (PROTONIX) 20 MG tablet Take 1 tablet (20 mg total) by mouth daily. 05/27/21   [name], NP  polyethylene glycol powder (MIRALAX) 17 GM/SCOOP powder 17 gm mix with liquid twice daily 03/14/21   Medina-Vargas, Monina C, NP  sodium polystyrene (SPS) 15 GM/60ML suspension TAKE 49mls BY MOUTH ONCE A WEEK TO keep potassium down 04/21/21   [name], NP  topiramate (TOPAMAX) 25 MG tablet Take 1 tablet (25 mg total) by mouth daily. 04/21/21   [name], NP  Vitamin D, Ergocalciferol, (DRISDOL) 1.25 MG (50000 UNIT) CAPS capsule TAKE 1 CAPSULE BY MOUTH ONCE WEEKLY 05/30/21   [name], MD    Physical Exam: Vitals:   07/04/21 1715 07/04/21 1745 07/04/21 1830 07/04/21 1838  BP:  (!) 158/71 120/72   Pulse: 68 67 65   Resp: 18 18 18    Temp:    97.9 F (36.6 C)  TempSrc:    Oral  SpO2: 95% 100% 100%     Constitutional: NAD, calm, comfortable Vitals:   07/04/21 1715 07/04/21 1745 07/04/21 1830 07/04/21 1838  BP:  (!) 158/71 120/72   Pulse: 68 67 65   Resp: 18 18 18    Temp:    97.9 F (36.6 C)  TempSrc:    Oral  SpO2: 95% 100% 100%    Eyes: PERRL, lids and conjunctivae normal ENMT: Mucous membranes are dry. Posterior pharynx clear of any exudate or lesions.Normal dentition.  Neck: normal, supple, no masses, no thyromegaly Respiratory:  clear to auscultation bilaterally, no wheezing, no crackles. Normal respiratory effort. No accessory muscle use.  Cardiovascular: Regular rate and rhythm, no murmurs / rubs / gallops. No extremity edema. 2+ pedal pulses. No carotid bruits.  Abdomen: no tenderness, no masses palpated. No hepatosplenomegaly. Bowel sounds positive.  Musculoskeletal: no clubbing / cyanosis. No joint deformity upper and lower extremities. Good ROM, no contractures. Normal muscle tone.  Skin: no rashes, lesions, ulcers. No induration Neurologic: CN 2-12 grossly intact. Sensation intact, DTR normal. Strength 5/5 in all 4.  Psychiatric: Normal judgment and insight. Alert and oriented x 3. Normal mood.     Labs on Admission: I have personally reviewed following labs and imaging studies  CBC: Recent Labs  Lab 07/04/21 1323 07/04/21 1853  WBC 13.6*  --   NEUTROABS 9.6*  --   HGB 10.4* 10.5*  HCT 34.5* 31.0*  MCV 95.8  --   PLT 290  --    Basic Metabolic Panel: Recent Labs  Lab 07/04/21 1323 07/04/21 1853  NA 140 140  K 5.1 5.9*  CL 108  --   CO2 23  --   GLUCOSE 98  --   BUN 57*  --   CREATININE 1.68*  --   CALCIUM 9.1  --   MG 1.7  --  GFR: CrCl cannot be calculated (Unknown ideal weight.). Liver Function Tests: Recent Labs  Lab 07/04/21 1323  AST 15  ALT 15  ALKPHOS 94  BILITOT 0.6  PROT 7.7  ALBUMIN 3.0*   No results for input(s): LIPASE, AMYLASE in the last 168 hours. No results for input(s): AMMONIA in the last 168 hours. Coagulation Profile: No results for input(s): INR, PROTIME in the last 168 hours. Cardiac Enzymes: No results for input(s): CKTOTAL, CKMB, CKMBINDEX, TROPONINI in the last 168 hours. BNP (last 3 results) No results for input(s): PROBNP in the last 8760 hours. HbA1C: No results for input(s): HGBA1C in the last 72 hours. CBG: No results for input(s): GLUCAP in the last 168 hours. Lipid Profile: No results for input(s): CHOL, HDL, LDLCALC, TRIG, CHOLHDL,  LDLDIRECT in the last 72 hours. Thyroid Function Tests: No results for input(s): TSH, T4TOTAL, FREET4, T3FREE, THYROIDAB in the last 72 hours. Anemia Panel: No results for input(s): VITAMINB12, FOLATE, FERRITIN, TIBC, IRON, RETICCTPCT in the last 72 hours. Urine analysis:    Component Value Date/Time   COLORURINE YELLOW 01/29/2019 1309   APPEARANCEUR HAZY (A) 01/29/2019 1309   LABSPEC 1.016 01/29/2019 1309   PHURINE 5.0 01/29/2019 1309   GLUCOSEU >=500 (A) 01/29/2019 1309   HGBUR NEGATIVE 01/29/2019 1309   BILIRUBINUR neg 06/03/2019 1116   KETONESUR NEGATIVE 01/29/2019 1309   PROTEINUR Negative 06/03/2019 1116   PROTEINUR 30 (A) 01/29/2019 1309   UROBILINOGEN 0.2 06/03/2019 1116   UROBILINOGEN 0.2 02/16/2018 1514   NITRITE neg 06/03/2019 1116   NITRITE NEGATIVE 01/29/2019 1309   LEUKOCYTESUR Moderate (2+) (A) 06/03/2019 1116   LEUKOCYTESUR MODERATE (A) 01/29/2019 1309    Radiological Exams on Admission: CT HEAD WO CONTRAST  Result Date: 07/04/2021 CLINICAL DATA:  Syncope. EXAM: CT HEAD WITHOUT CONTRAST TECHNIQUE: Contiguous axial images were obtained from the base of the skull through the vertex without intravenous contrast. RADIATION DOSE REDUCTION: This exam was performed according to the departmental dose-optimization program which includes automated exposure control, adjustment of the mA and/or kV according to patient size and/or use of iterative reconstruction technique. COMPARISON:  CT 08/27/2018 FINDINGS: Brain: No acute intracranial hemorrhage. No focal mass lesion. No CT evidence of acute infarction. No midline shift or mass effect. No hydrocephalus. Basilar cisterns are patent. There are periventricular and subcortical white matter hypodensities. Generalized cortical atrophy. Vascular: No hyperdense vessel or unexpected calcification. Skull: Normal. Negative for fracture or focal lesion. Sinuses/Orbits: Paranasal sinuses and mastoid air cells are clear. Orbits are clear. Other:  None. IMPRESSION: 1. No acute intracranial findings. 2. Extensive white matter microvascular disease. 3. Mild atrophy Electronically Signed   By: Suzy Bouchard M.D.   On: 07/04/2021 15:04   CT Chest Wo Contrast  Result Date: 07/04/2021 CLINICAL DATA:  Syncope EXAM: CT CHEST WITHOUT CONTRAST TECHNIQUE: Multidetector CT imaging of the chest was performed following the standard protocol without IV contrast. RADIATION DOSE REDUCTION: This exam was performed according to the departmental dose-optimization program which includes automated exposure control, adjustment of the mA and/or kV according to patient size and/or use of iterative reconstruction technique. COMPARISON:  07/04/2021, 06/14/2015 FINDINGS: Cardiovascular: Limited unenhanced imaging of the heart and great vessels demonstrates no pericardial effusion. There is extensive atherosclerosis throughout the coronary vasculature. Normal caliber of the thoracic aorta, with mild atherosclerosis of the aortic arch. Evaluation of the vascular lumen is limited without IV contrast. Mediastinum/Nodes: No enlarged mediastinal or axillary lymph nodes. Thyroid gland, trachea, and esophagus demonstrate no significant findings. Stable postsurgical  changes at the gastroesophageal junction, with small hiatal hernia. Lungs/Pleura: No acute airspace disease, effusion, or pneumothorax. Central airways are patent. Upper Abdomen: No acute abnormality. Musculoskeletal: No acute or destructive bony lesions. Reconstructed images demonstrate no additional findings. IMPRESSION: 1. No acute intrathoracic process. 2. Aortic Atherosclerosis (ICD10-I70.0). Coronary artery atherosclerosis. Electronically Signed   By: Randa Ngo M.D.   On: 07/04/2021 15:03   DG Chest Port 1 View  Result Date: 07/04/2021 CLINICAL DATA:  Syncope EXAM: PORTABLE CHEST 1 VIEW COMPARISON:  03/03/2020 FINDINGS: Apparent shift of mediastinum to the right may be due to rotation. Transverse diameter of  heart is increased. Central pulmonary vessels are more prominent. There is poor inspiration. Interstitial markings in the parahilar regions and lower lung fields are prominent. Costophrenic angles are clear. There is no pneumothorax. IMPRESSION: Central pulmonary vessels are more prominent. There is poor inspiration. Increased interstitial markings are seen in the parahilar regions and lower lung fields suggesting interstitial edema or interstitial pneumonia. There is no focal consolidation in the peripheral lung fields. Electronically Signed   By: Elmer Picker M.D.   On: 07/04/2021 13:16    EKG: Independently reviewed.  Sinus rhythm, first-degree AV block, no QTc prolongation  Assessment/Plan Principal Problem:   Syncope Active Problems:   Syncope, vasovagal  (please populate well all problems here in Problem List. (For example, if patient is on BP meds at home and you resume or decide to hold them, it is a problem that needs to be her. Same for CAD, COPD, HLD and so on)  Syncope -Suspect vasovagal secondary to hypotension -Clinically patient appears to be hypovolemic and dehydrated (BUN/Cre ratio>20), probably overmedicated for HTN and HFpEF. -Plan to hold off all her BP medications and Lasix for tonight and let her blood pressure recovers, and recheck orthostatic vital signs time tomorrow. -Also ordered echocardiogram for tomorrow. -Other DDx, polypharmacy should be considered, even family wonder whether patient need to take so many BP medications.  Management as above.  Explained to family and patient at bedside, both expressed understanding and agreed. She has no focal deficit, will hold off further brain imaging/MRI, explained to family. UA pending.  ABG showed normal pH and normal PCO2. Check A1C, but her recent A1c November 2022 was 8.5 appears to be recently controlled.  Leukocytosis -She has no cough, no urinary symptoms no diarrhea no skin lesions, monitor off  antibiotics.  History of PE off anticoagulation in 2021 -Patient used to be on Xarelto however discontinued in 2021 due to worsening of iron deficiency anemia.  Will check D-dimer, and consider VQ scan.  CKD stage III -Appears to be hypovolemic, BP borderline low, hold off Lasix and BP meds.  HTN -With borderline hypotension, hold off Coreg, Lasix, Imdur -Check orthostatic vital signs  COPD -Stable, no symptoms or signs of acute exacerbation -Continue Symbicort  IDDM -Cut down Lantus doses -Add sliding scale.  Breast history, -Continue anastrozole  Gout -Continue Uloric  Morbid obesity -Her weight has been stable, not interested in weight loss  Dementia -Mentation at baseline, continue memantine.  CAD -Continue aspirin and Plavix.  DVT prophylaxis: Lovenox Code Status: DNR Family Communication: Daughter at bedside Disposition Plan: Expect less than 2 midnight hospital stay Consults called: None Admission status: Telemetry observation   Lequita Halt MD [facility] Pager [phone]  07/04/2021, 7:21 PM

## 2021-07-04 NOTE — ED Notes (Signed)
Purewick placed on pt. 

## 2021-07-04 NOTE — ED Notes (Signed)
Message sent to pharmacy to verify for pending meds

## 2021-07-04 NOTE — ED Provider Notes (Signed)
Patient signed out to me by previous provider. Please refer to their note for full HPI.  Briefly this is an 87 year old female who had an unprovoked, seated syncope.  She is obese, multiple comorbidities, concern for high risk syncope.  Pending work-up/imaging. Physical Exam  BP 120/72    Pulse 65    Temp 97.9 F (36.6 C) (Oral)    Resp 18    SpO2 100%   Physical Exam Vitals and nursing note reviewed.  Constitutional:      Appearance: Normal appearance. She is obese.  HENT:     Head: Normocephalic.     Mouth/Throat:     Mouth: Mucous membranes are moist.  Cardiovascular:     Rate and Rhythm: Normal rate.  Pulmonary:     Effort: Pulmonary effort is normal. No respiratory distress.  Abdominal:     Palpations: Abdomen is soft.     Tenderness: There is no abdominal tenderness.  Skin:    General: Skin is warm.  Neurological:     Mental Status: She is alert and oriented to person, place, and time. Mental status is at baseline.  Psychiatric:        Mood and Affect: Mood normal.    Procedures  Procedures  ED Course / MDM    Medical Decision Making Amount and/or Complexity of Data Reviewed Labs: ordered. Radiology: ordered. ECG/medicine tests: ordered.  Risk Prescription drug management. Decision regarding hospitalization.   Work-up is reassuring, CT imaging is negative.  Patient mentioned feeling generally fatigued this morning without any other specific complaint.  Blood work is mostly baseline for the patient, flu and COVID is negative.  We are pending urinalysis.  Patient has history of CHF, last echo was in 2021 and a limited study.  She is high risk based off of syncope scores with obesity,, rhytides, CHF, seated syncope.  Plan for medical admission.  Patient and family at bedside agree with this.  Patients evaluation and results requires admission for further treatment and care.  Spoke with hospitalist Dr. Roosevelt Locks, reviewed patient's ED course and they accept admission.   Patient agrees with admission plan, offers no new complaints and is stable/unchanged at time of admit.       Lorelle Gibbs, DO 07/04/21 1906

## 2021-07-05 ENCOUNTER — Observation Stay (HOSPITAL_COMMUNITY): Payer: HMO

## 2021-07-05 ENCOUNTER — Observation Stay (HOSPITAL_BASED_OUTPATIENT_CLINIC_OR_DEPARTMENT_OTHER): Payer: HMO

## 2021-07-05 DIAGNOSIS — R7989 Other specified abnormal findings of blood chemistry: Secondary | ICD-10-CM | POA: Diagnosis not present

## 2021-07-05 DIAGNOSIS — R55 Syncope and collapse: Secondary | ICD-10-CM

## 2021-07-05 LAB — CBC
HCT: 35.2 % — ABNORMAL LOW (ref 36.0–46.0)
Hemoglobin: 10.6 g/dL — ABNORMAL LOW (ref 12.0–15.0)
MCH: 28.7 pg (ref 26.0–34.0)
MCHC: 30.1 g/dL (ref 30.0–36.0)
MCV: 95.4 fL (ref 80.0–100.0)
Platelets: 274 10*3/uL (ref 150–400)
RBC: 3.69 MIL/uL — ABNORMAL LOW (ref 3.87–5.11)
RDW: 16.6 % — ABNORMAL HIGH (ref 11.5–15.5)
WBC: 19.1 10*3/uL — ABNORMAL HIGH (ref 4.0–10.5)
nRBC: 0 % (ref 0.0–0.2)

## 2021-07-05 LAB — URINALYSIS, ROUTINE W REFLEX MICROSCOPIC
Bilirubin Urine: NEGATIVE
Glucose, UA: NEGATIVE mg/dL
Hgb urine dipstick: NEGATIVE
Ketones, ur: NEGATIVE mg/dL
Nitrite: NEGATIVE
Protein, ur: NEGATIVE mg/dL
Specific Gravity, Urine: 1.011 (ref 1.005–1.030)
pH: 5 (ref 5.0–8.0)

## 2021-07-05 LAB — GLUCOSE, CAPILLARY
Glucose-Capillary: 180 mg/dL — ABNORMAL HIGH (ref 70–99)
Glucose-Capillary: 157 mg/dL — ABNORMAL HIGH (ref 70–99)

## 2021-07-05 LAB — BASIC METABOLIC PANEL
Anion gap: 9 (ref 5–15)
BUN: 60 mg/dL — ABNORMAL HIGH (ref 8–23)
CO2: 18 mmol/L — ABNORMAL LOW (ref 22–32)
Calcium: 8.9 mg/dL (ref 8.9–10.3)
Chloride: 111 mmol/L (ref 98–111)
Creatinine, Ser: 1.69 mg/dL — ABNORMAL HIGH (ref 0.44–1.00)
GFR, Estimated: 30 mL/min — ABNORMAL LOW (ref >60)
Glucose, Bld: 148 mg/dL — ABNORMAL HIGH (ref 70–99)
Potassium: 5.8 mmol/L — ABNORMAL HIGH (ref 3.5–5.1)
Sodium: 138 mmol/L (ref 135–145)

## 2021-07-05 LAB — POTASSIUM
Potassium: 5.5 mmol/L — ABNORMAL HIGH (ref 3.5–5.1)
Potassium: 5.6 mmol/L — ABNORMAL HIGH (ref 3.5–5.1)
Potassium: 5.1 mmol/L (ref 3.5–5.1)

## 2021-07-05 LAB — CBG MONITORING, ED
Glucose-Capillary: 118 mg/dL — ABNORMAL HIGH (ref 70–99)
Glucose-Capillary: 128 mg/dL — ABNORMAL HIGH (ref 70–99)
Glucose-Capillary: 113 mg/dL — ABNORMAL HIGH (ref 70–99)

## 2021-07-05 LAB — TSH: TSH: 1.194 u[IU]/mL (ref 0.350–4.500)

## 2021-07-05 LAB — ECHOCARDIOGRAM COMPLETE
Area-P 1/2: 2.79 cm2
Calc EF: 54.4 %
Single Plane A2C EF: 57.9 %
Single Plane A4C EF: 50.6 %

## 2021-07-05 MED ORDER — ALBUTEROL SULFATE (2.5 MG/3ML) 0.083% IN NEBU
10.0000 mg | INHALATION_SOLUTION | Freq: Once | RESPIRATORY_TRACT | Status: AC
  Administered 2021-07-05: 10 mg via RESPIRATORY_TRACT
  Filled 2021-07-05: qty 12

## 2021-07-05 MED ORDER — SODIUM ZIRCONIUM CYCLOSILICATE 10 G PO PACK
10.0000 g | PACK | Freq: Once | ORAL | Status: AC
  Administered 2021-07-05: 10 g via ORAL
  Filled 2021-07-05: qty 1

## 2021-07-05 MED ORDER — ALPRAZOLAM 0.25 MG PO TABS
0.2500 mg | ORAL_TABLET | Freq: Two times a day (BID) | ORAL | Status: DC | PRN
  Administered 2021-07-05 – 2021-07-07 (×3): 0.25 mg via ORAL
  Filled 2021-07-05 (×4): qty 1

## 2021-07-05 MED ORDER — TECHNETIUM TO 99M ALBUMIN AGGREGATED
4.1000 | Freq: Once | INTRAVENOUS | Status: AC | PRN
  Administered 2021-07-05: 4.1 via INTRAVENOUS

## 2021-07-05 NOTE — Progress Notes (Signed)
NEW ADMISSION NOTE New Admission Note:   Arrival Method: stretcher Mental Orientation:  Telemetry:[id number] Assessment: Completed Skin: intact  IV: LFA Pain:  0/10 Tubes:NONE Safety Measures: Safety Fall Prevention Plan has been given, discussed and signed Admission: Completed [facility] Orientation: Patient has been orientated to the room, unit and staff.  Family:none at bedside   Orders have been reviewed and implemented. Will continue to monitor the patient. Call light has been placed within reach and bed alarm has been activated.   Lela Murfin S Gunter Conde, RN

## 2021-07-05 NOTE — Evaluation (Signed)
Physical Therapy Evaluation Patient Details Name: [name] MRN: [id number] DOB: [birth year] Today's Date: 07/05/2021  History of Present Illness  Pt is an 87 y/o female admitted secondary to syncope. PMH includes CKD, HTN, CAD, COPD, breast cancer, and PE.  Clinical Impression  Pt admitted secondary to problem above with deficits below. PT was speaking to nurse in hall and heard "help" cry from bathroom. Upon entry, pt was leaned over with upper body on WC and daughter holding pt up. PT wedged herself behind pt and assisted pt with turning to sit in chair. RNX2, NT, and duaughter present to help as well.  Required mod to max A +2 to get pt safely back into chair. Upon return to room, pt able to transfer back to bed with minA. Pt's daughter present and reports plan is to take pt home, but requested some assist with basic ADL tasks. Recommending HHPT/HHaide for assist at home. Pt also needs new WC for safety with mobility at home. Will continue to follow acutely.      Recommendations for follow up therapy are one component of a multi-disciplinary discharge planning process, led by the attending physician.  Recommendations may be updated based on patient status, additional functional criteria and insurance authorization.  Follow Up Recommendations Home health PT [facility])    Assistance Recommended at Discharge Frequent or constant Supervision/Assistance  Patient can return home with the following  A lot of help with walking and/or transfers;A lot of help with bathing/dressing/bathroom;Help with stairs or ramp for entrance;Assist for transportation;Assistance with cooking/housework;Direct supervision/assist for financial management;Direct supervision/assist for medications management    Equipment Recommendations Wheelchair (measurements PT);Wheelchair cushion (measurements PT) (bariatric WC)  Recommendations for Other Services       Functional Status Assessment Patient has had a recent  decline in their functional status and demonstrates the ability to make significant improvements in function in a reasonable and predictable amount of time.     Precautions / Restrictions Precautions Precautions: Fall Restrictions Weight Bearing Restrictions: No      Mobility  Bed Mobility Overal bed mobility: Needs Assistance Bed Mobility: Sit to Supine       Sit to supine: Mod assist   General bed mobility comments: Pt returning to sidelying by facing bed in standing and bringing knee onto bed. Daughter requesting to assist and required assist with LEs.    Transfers Overall transfer level: Needs assistance   Transfers: Sit to/from Stand, Bed to chair/wheelchair/BSC Sit to Stand: Mod assist, Max assist, Min assist, +2 physical assistance Stand pivot transfers: Min assist         General transfer comment: PT was speaking to nurse in hall and heard "help" cry from bathroom. Upon entry. Pt was leaned over with upper body on WC and daughter holding pt up. PT wedged herself behind pt and assisted pt with turning to sit in chair. RNX2, NT, and duaughter present to help as well.  Required mod to max A +2 to get pt safely back into chair. Used WC to return to room and pt requiring min A to stand and pivot towards bed from [employer]    Ambulation/Gait                  Stairs            Wheelchair Mobility    Modified Rankin (Stroke Patients Only)       Balance Overall balance assessment: Needs assistance Sitting-balance support: No upper extremity supported, Feet supported Sitting balance-Leahy Scale:  Fair     Standing balance support: Bilateral upper extremity supported Standing balance-Leahy Scale: Poor Standing balance comment: Reliant on BUE and external support                             Pertinent Vitals/Pain Pain Assessment Pain Assessment: No/denies pain    Home Living Family/patient expects to be discharged to:: Private  residence Living Arrangements: Children Available Help at Discharge: Family;Available 24 hours/day Type of Home: House Home Access: Ramped entrance       Home Layout: One level Home Equipment: Conservation officer, nature (2 wheels);Wheelchair - manual;BSC/3in1;Shower seat Additional Comments: Pt reports her WC does not work as it should    Prior Function Prior Level of Function : Needs assist             Mobility Comments: Uses WC mainly, but does have RW for short distance ambulation. ADLs Comments: Requires assist for ADLs.     Hand Dominance        Extremity/Trunk Assessment   Upper Extremity Assessment Upper Extremity Assessment: Defer to OT evaluation    Lower Extremity Assessment Lower Extremity Assessment: Generalized weakness    Cervical / Trunk Assessment Cervical / Trunk Assessment: Other exceptions Cervical / Trunk Exceptions: increased body habitus  Communication   Communication: No difficulties  Cognition Arousal/Alertness: Awake/alert Behavior During Therapy: Anxious Overall Cognitive Status: Within Functional Limits for tasks assessed                                 General Comments: Fearful of falling        General Comments General comments (skin integrity, edema, etc.): Pt's daughter present. Reports plan is to return home, but would like aide to assist with ADL tasks.    Exercises     Assessment/Plan    PT Assessment Patient needs continued PT services  PT Problem List Decreased strength;Decreased activity tolerance;Decreased balance;Decreased mobility;Decreased safety awareness;Pain       PT Treatment Interventions DME instruction;Gait training;Therapeutic activities;Functional mobility training;Balance training;Therapeutic exercise;Patient/family education;Cognitive remediation    PT Goals (Current goals can be found in the Care Plan section)  Acute Rehab PT Goals Patient Stated Goal: to return home PT Goal Formulation: With  patient Time For Goal Achievement: 07/19/21 Potential to Achieve Goals: Fair    Frequency Min 3X/week     Co-evaluation               AM-PAC PT "6 Clicks" Mobility  Outcome Measure Help needed turning from your back to your side while in a flat bed without using bedrails?: A Little Help needed moving from lying on your back to sitting on the side of a flat bed without using bedrails?: A Lot Help needed moving to and from a bed to a chair (including a wheelchair)?: A Little Help needed standing up from a chair using your arms (e.g., wheelchair or bedside chair)?: A Little Help needed to walk in hospital room?: A Lot Help needed climbing 3-5 steps with a railing? : A Lot 6 Click Score: 15    End of Session Equipment Utilized During Treatment: Gait belt Activity Tolerance: Patient limited by fatigue Patient left: in bed;with call bell/phone within reach;with family/visitor present (on stretcher in ED) Nurse Communication: Mobility status PT Visit Diagnosis: Unsteadiness on feet (R26.81);Muscle weakness (generalized) (M62.81);Difficulty in walking, not elsewhere classified (R26.2)    Time: 1353-1420 PT  Time Calculation (min) (ACUTE ONLY): 27 min   Charges:   PT Evaluation $PT Eval Moderate Complexity: 1 Mod PT Treatments $Therapeutic Activity: 8-22 mins        Lou Miner, DPT  Acute Rehabilitation Services  Pager: [phone] Office: [phone]   Rudean Hitt 07/05/2021, 4:05 PM

## 2021-07-05 NOTE — Progress Notes (Signed)
° ° °  Echocardiogram 2D Echocardiogram has been performed.  Beryle Beams 07/05/2021, 8:30 AM

## 2021-07-05 NOTE — Care Management Obs Status (Signed)
[facility] NOTIFICATION   Patient Details  Name: [name] MRN: [id number] Date of Birth: February 25, 1935   Medicare Observation Status Notification Given:  Yes    Tom-Johnson, Renea Ee, RN 07/05/2021, 4:09 PM

## 2021-07-05 NOTE — ED Notes (Signed)
Pt to Nuc Med

## 2021-07-05 NOTE — ED Notes (Signed)
Throughout the night pt requested to be turned multiple times. Pt also requested to get in a wheelchair and go to the bathroom. Pt reports when she stood up and privet she got really dizziness. Pt sat back down on the bed. Pt was placed back in bed. Pt has call bell , bed in lowest level

## 2021-07-05 NOTE — Progress Notes (Signed)
PROGRESS NOTE    [name]  QQI:[id number] DOB: 1934-06-16 DOA: 07/04/2021 PCP: [name], NP     Brief Narrative:  [name] is a 87 y.o. female with medical history significant of IDDM, COPD, CAD with stenting, chronic HFpEF, history of PE of anticoagulation due to worsening of iron deficiency anemia in 2021, COPD, breast CA on hormone therapy CKD stage IIIa, morbid obesity, multiple OA, gout, chronic ambulation dysfunction, diabetic neuropathy, chronic dizziness on meclizine, presented with syncope.   New events last 24 hours / Subjective: Patient seen in the emergency department, her main complaint is her discomfort with laying on the ED stretcher.  No complaints of chest pain, shortness of breath, nausea, vomiting, diarrhea.  Assessment & Plan:   Principal Problem:   Syncope Active Problems:   Essential hypertension   Coronary artery disease of native artery of native heart with stable angina pectoris (HCC)   GERD (gastroesophageal reflux disease)   COPD (chronic obstructive pulmonary disease) (HCC)   History of pulmonary embolus (PE)   CKD (chronic kidney disease) stage 3, GFR 30-59 ml/min (HCC)   Dyslipidemia associated with type 2 diabetes mellitus (HCC)   Breast cancer (HCC)   Hyperkalemia   Syncope, vasovagal   Syncope -Thought to be secondary to antihypertensive medications -Echocardiogram limited due to body habitus and suboptimal imaging.  Aortic valve was not well visualized -VQ scan negative -Check orthostatic vital sign  Hyperkalemia -Lokelma ordered -Repeat potassium level this afternoon  History of PE -Patient has been off Xarelto since 2021 due to worsening of iron deficiency anemia  Leukocytosis -Without acute infectious etiology -Repeat labs in the morning  CAD -Continue aspirin and Plavix, lipitor   CKD stage IIIb -Baseline Cr 1.6  -Stable   Hypertension -Holding home medications including hydralazine, toprol, Lasix,  Imdur -Check orthostatic vital sign  COPD -Without acute exacerbation  Diabetes mellitus -Hemoglobin a1c 7.0 -Continue Semglee, sliding scale insulin  History of breast cancer -Continue anastrozole  Dementia, mild -Continue memantine    DVT prophylaxis:  enoxaparin (LOVENOX) injection 30 mg Start: 07/04/21 2000  Code Status: DNR  Family Communication: Daughter over the phone Disposition Plan:  Status is: Observation The patient will require care spanning > 2 midnights and should be moved to inpatient because: Repeat labs and orthostatic vital signs     Antimicrobials:  Anti-infectives (From admission, onward)    None        Objective: Vitals:   07/05/21 0917 07/05/21 1000 07/05/21 1133 07/05/21 1200  BP:  (!) 114/98 (!) 117/46 127/76  Pulse:  82 80 80  Resp:  (!) 21 (!) 21 (!) 22  Temp:      TempSrc:      SpO2:  95% 95% 96%  Weight: (!) 151 kg     Height: 5\' 7"  (1.702 m)      No intake or output data in the 24 hours ending 07/05/21 1322 Filed Weights   07/05/21 0917  Weight: (!) 151 kg    Examination:  General exam: Appears calm and comfortable  Respiratory system: Clear to auscultation. Respiratory effort normal. No respiratory distress. No conversational dyspnea.  Cardiovascular system: S1 & S2 heard, RRR. No murmurs. No pedal edema. Gastrointestinal system: Abdomen is nondistended, soft and nontender. Normal bowel sounds heard. Central nervous system: Alert and oriented. No focal neurological deficits. Speech clear.  Extremities: Symmetric in appearance  Skin: No rashes, lesions or ulcers on exposed skin  Psychiatry: Judgement and insight appear normal. Mood &  affect appropriate.   Data Reviewed: I have personally reviewed following labs and imaging studies  CBC: Recent Labs  Lab 07/04/21 1323 07/04/21 1853 07/05/21 0449  WBC 13.6*  --  19.1*  NEUTROABS 9.6*  --   --   HGB 10.4* 10.5* 10.6*  HCT 34.5* 31.0* 35.2*  MCV 95.8  --  95.4   PLT 290  --  161   Basic Metabolic Panel: Recent Labs  Lab 07/04/21 1323 07/04/21 1853 07/05/21 0449  NA 140 140 138  K 5.1 5.9* 5.8*  CL 108  --  111  CO2 23  --  18*  GLUCOSE 98  --  148*  BUN 57*  --  60*  CREATININE 1.68*  --  1.69*  CALCIUM 9.1  --  8.9  MG 1.7  --   --    GFR: Estimated Creatinine Clearance: 38.1 mL/min (A) (by C-G formula based on SCr of 1.69 mg/dL (H)). Liver Function Tests: Recent Labs  Lab 07/04/21 1323  AST 15  ALT 15  ALKPHOS 94  BILITOT 0.6  PROT 7.7  ALBUMIN 3.0*   No results for input(s): LIPASE, AMYLASE in the last 168 hours. No results for input(s): AMMONIA in the last 168 hours. Coagulation Profile: No results for input(s): INR, PROTIME in the last 168 hours. Cardiac Enzymes: Recent Labs  Lab 07/04/21 2000  CKTOTAL 48   BNP (last 3 results) No results for input(s): PROBNP in the last 8760 hours. HbA1C: Recent Labs    07/04/21 2005  HGBA1C 7.0*   CBG: Recent Labs  Lab 07/04/21 2310 07/05/21 0546 07/05/21 0747 07/05/21 1140  GLUCAP 187* 118* 128* 113*   Lipid Profile: No results for input(s): CHOL, HDL, LDLCALC, TRIG, CHOLHDL, LDLDIRECT in the last 72 hours. Thyroid Function Tests: Recent Labs    07/05/21 0449  TSH 1.194   Anemia Panel: No results for input(s): VITAMINB12, FOLATE, FERRITIN, TIBC, IRON, RETICCTPCT in the last 72 hours. Sepsis Labs: No results for input(s): PROCALCITON, LATICACIDVEN in the last 168 hours.  Recent Results (from the past 240 hour(s))  Resp Panel by RT-PCR (Flu A&B, Covid) Nasopharyngeal Swab     Status: None   Collection Time: 07/04/21  1:21 PM   Specimen: Nasopharyngeal Swab; Nasopharyngeal(NP) swabs in vial transport medium  Result Value Ref Range Status   SARS Coronavirus 2 by RT PCR NEGATIVE NEGATIVE Final    Comment: (NOTE) SARS-CoV-2 target nucleic acids are NOT DETECTED.  The SARS-CoV-2 RNA is generally detectable in upper respiratory specimens during the acute  phase of infection. The lowest concentration of SARS-CoV-2 viral copies this assay can detect is 138 copies/mL. A negative result does not preclude SARS-Cov-2 infection and should not be used as the sole basis for treatment or other patient management decisions. A negative result may occur with  improper specimen collection/handling, submission of specimen other than nasopharyngeal swab, presence of viral mutation(s) within the areas targeted by this assay, and inadequate number of viral copies(<138 copies/mL). A negative result must be combined with clinical observations, patient history, and epidemiological information. The expected result is Negative.  Fact Sheet for Patients:  EntrepreneurPulse.com.au  Fact Sheet for Healthcare Providers:  IncredibleEmployment.be  This test is no t yet approved or cleared by the [place] FDA and  has been authorized for detection and/or diagnosis of SARS-CoV-2 by FDA under an Emergency Use Authorization (EUA). This EUA will remain  in effect (meaning this test can be used) for the duration of the COVID-19 declaration  under Section 564(b)(1) of the Act, 21 U.S.C.section 360bbb-3(b)(1), unless the authorization is terminated  or revoked sooner.       Influenza A by PCR NEGATIVE NEGATIVE Final   Influenza B by PCR NEGATIVE NEGATIVE Final    Comment: (NOTE) The Xpert Xpress SARS-CoV-2/FLU/RSV plus assay is intended as an aid in the diagnosis of influenza from Nasopharyngeal swab specimens and should not be used as a sole basis for treatment. Nasal washings and aspirates are unacceptable for Xpert Xpress SARS-CoV-2/FLU/RSV testing.  Fact Sheet for Patients: EntrepreneurPulse.com.au  Fact Sheet for Healthcare Providers: IncredibleEmployment.be  This test is not yet approved or cleared by the [place] FDA and has been authorized for detection and/or diagnosis of  SARS-CoV-2 by FDA under an Emergency Use Authorization (EUA). This EUA will remain in effect (meaning this test can be used) for the duration of the COVID-19 declaration under Section 564(b)(1) of the Act, 21 U.S.C. section 360bbb-3(b)(1), unless the authorization is terminated or revoked.  Performed at [facility], [place] [address] [place], [place] [address]       Radiology Studies: DG Shoulder 1V Left  Result Date: 07/04/2021 CLINICAL DATA:  Shoulder pain. EXAM: LEFT SHOULDER COMPARISON:  None. FINDINGS: There is no evidence of acute fracture or dislocation. Examination is limited due to single view. Moderate to severe degenerative changes are noted at the glenohumeral and acromioclavicular joints. Soft tissues are unremarkable. IMPRESSION: Moderate to severe degenerative changes at the glenohumeral and acromioclavicular joints. Electronically Signed   By: Brett Fairy M.D.   On: 07/04/2021 21:31   CT HEAD WO CONTRAST  Result Date: 07/04/2021 CLINICAL DATA:  Syncope. EXAM: CT HEAD WITHOUT CONTRAST TECHNIQUE: Contiguous axial images were obtained from the base of the skull through the vertex without intravenous contrast. RADIATION DOSE REDUCTION: This exam was performed according to the departmental dose-optimization program which includes automated exposure control, adjustment of the mA and/or kV according to patient size and/or use of iterative reconstruction technique. COMPARISON:  CT 08/27/2018 FINDINGS: Brain: No acute intracranial hemorrhage. No focal mass lesion. No CT evidence of acute infarction. No midline shift or mass effect. No hydrocephalus. Basilar cisterns are patent. There are periventricular and subcortical white matter hypodensities. Generalized cortical atrophy. Vascular: No hyperdense vessel or unexpected calcification. Skull: Normal. Negative for fracture or focal lesion. Sinuses/Orbits: Paranasal sinuses and mastoid air cells are clear. Orbits are clear. Other:  None. IMPRESSION: 1. No acute intracranial findings. 2. Extensive white matter microvascular disease. 3. Mild atrophy Electronically Signed   By: Suzy Bouchard M.D.   On: 07/04/2021 15:04   CT Chest Wo Contrast  Result Date: 07/04/2021 CLINICAL DATA:  Syncope EXAM: CT CHEST WITHOUT CONTRAST TECHNIQUE: Multidetector CT imaging of the chest was performed following the standard protocol without IV contrast. RADIATION DOSE REDUCTION: This exam was performed according to the departmental dose-optimization program which includes automated exposure control, adjustment of the mA and/or kV according to patient size and/or use of iterative reconstruction technique. COMPARISON:  07/04/2021, 06/14/2015 FINDINGS: Cardiovascular: Limited unenhanced imaging of the heart and great vessels demonstrates no pericardial effusion. There is extensive atherosclerosis throughout the coronary vasculature. Normal caliber of the thoracic aorta, with mild atherosclerosis of the aortic arch. Evaluation of the vascular lumen is limited without IV contrast. Mediastinum/Nodes: No enlarged mediastinal or axillary lymph nodes. Thyroid gland, trachea, and esophagus demonstrate no significant findings. Stable postsurgical changes at the gastroesophageal junction, with small hiatal hernia. Lungs/Pleura: No acute airspace disease, effusion, or pneumothorax. Central airways  are patent. Upper Abdomen: No acute abnormality. Musculoskeletal: No acute or destructive bony lesions. Reconstructed images demonstrate no additional findings. IMPRESSION: 1. No acute intrathoracic process. 2. Aortic Atherosclerosis (ICD10-I70.0). Coronary artery atherosclerosis. Electronically Signed   By: Randa Ngo M.D.   On: 07/04/2021 15:03   NM Pulmonary Perf and Vent  Result Date: 07/05/2021 CLINICAL DATA:  Positive D-dimer, pulmonary embolism suspected EXAM: NUCLEAR MEDICINE PERFUSION LUNG SCAN TECHNIQUE: Perfusion images were obtained in multiple projections  after intravenous injection of radiopharmaceutical. Ventilation scans intentionally deferred if perfusion scan and chest x-ray adequate for interpretation during COVID 19 epidemic. RADIOPHARMACEUTICALS:  4.1 mCi Tc-74m MAA IV COMPARISON:  03/03/2020 FINDINGS: There are no discrete wedge-shaped perfusion defects. No significant interval changes are noted. IMPRESSION: There are no wedge-shaped perfusion defects. There are no imaging signs of pulmonary embolism. Electronically Signed   By: Elmer Picker M.D.   On: 07/05/2021 13:09   DG Chest Port 1 View  Result Date: 07/04/2021 CLINICAL DATA:  Syncope EXAM: PORTABLE CHEST 1 VIEW COMPARISON:  03/03/2020 FINDINGS: Apparent shift of mediastinum to the right may be due to rotation. Transverse diameter of heart is increased. Central pulmonary vessels are more prominent. There is poor inspiration. Interstitial markings in the parahilar regions and lower lung fields are prominent. Costophrenic angles are clear. There is no pneumothorax. IMPRESSION: Central pulmonary vessels are more prominent. There is poor inspiration. Increased interstitial markings are seen in the parahilar regions and lower lung fields suggesting interstitial edema or interstitial pneumonia. There is no focal consolidation in the peripheral lung fields. Electronically Signed   By: Elmer Picker M.D.   On: 07/04/2021 13:16   ECHOCARDIOGRAM COMPLETE  Result Date: 07/05/2021    ECHOCARDIOGRAM REPORT   Patient Name:   MARIELIS SAMARA Date of Exam: 07/05/2021 Medical Rec #:  [id number]       Height:       67.0 in Accession #:    [id number]      Weight:       313.0 lb Date of Birth:  12/28/1934       BSA:          2.446 m Patient Age:    59 years        BP:           125/55 mmHg Patient Gender: F               HR:           91 bpm. Exam Location:  Inpatient Procedure: 2D Echo, Limited Echo, Cardiac Doppler and Color Doppler Indications:    R55 SYNCOPE  History:        Patient has prior history  of Echocardiogram examinations, most                 recent 02/15/2020. CAD and Previous Myocardial Infarction; Risk                 Factors:Hypertension and Dyslipidemia.  Sonographer:    Beryle Beams Referring Phys: [id number] PING T ZHANG  Sonographer Comments: Suboptimal parasternal window, suboptimal apical window, no subcostal window and patient is morbidly obese. Image acquisition challenging due to patient body habitus and Image acquisition challenging due to respiratory motion. PATIENT UNABLEO ROLL ON SIDE DUE TO BODY HABITUS IMPRESSIONS  1. Left ventricular ejection fraction, by estimation, is 55 to 60%. The left ventricle has normal function. Left ventricular endocardial border not optimally defined to evaluate regional wall motion- there are no wall motion  abnormalities seen in views obtained. Left ventricular diastolic parameters are consistent with Grade I diastolic dysfunction (impaired relaxation).  2. Right ventricular systolic function is normal. The right ventricular size is normal.  3. The mitral valve was not well visualized. No evidence of mitral valve regurgitation. No evidence of mitral stenosis.  4. The aortic valve was not well visualized. Aortic valve regurgitation is not visualized. Comparison(s): Difficult comparison- this is a technically difficult study. FINDINGS  Left Ventricle: Left ventricular ejection fraction, by estimation, is 55 to 60%. The left ventricle has normal function. Left ventricular endocardial border not optimally defined to evaluate regional wall motion. The left ventricular internal cavity size was normal in size. There is no left ventricular hypertrophy. Left ventricular diastolic parameters are consistent with Grade I diastolic dysfunction (impaired relaxation). Right Ventricle: The right ventricular size is normal. Right vetricular wall thickness was not well visualized. Right ventricular systolic function is normal. Left Atrium: Left atrial size was normal in  size. Right Atrium: Right atrial size was normal in size. Pericardium: There is no evidence of pericardial effusion. Presence of epicardial fat layer. Mitral Valve: The mitral valve was not well visualized. No evidence of mitral valve regurgitation. No evidence of mitral valve stenosis. Tricuspid Valve: The tricuspid valve is not well visualized. Tricuspid valve regurgitation is not demonstrated. No evidence of tricuspid stenosis. Aortic Valve: The aortic valve was not well visualized. Aortic valve regurgitation is not visualized. Pulmonic Valve: The pulmonic valve was not well visualized. Pulmonic valve regurgitation is not visualized. Aorta: The ascending aorta was not well visualized. IAS/Shunts: The interatrial septum was not well visualized.   LV Volumes (MOD) LV vol d, MOD A2C: 36.1 ml LV vol d, MOD A4C: 54.7 ml LV vol s, MOD A2C: 15.2 ml LV vol s, MOD A4C: 27.0 ml LV SV MOD A2C:     20.9 ml LV SV MOD A4C:     54.7 ml LV SV MOD BP:      24.5 ml LEFT ATRIUM             Index LA Vol (A2C):   37.9 ml 15.49 ml/m LA Vol (A4C):   23.6 ml 9.65 ml/m LA Biplane Vol: 31.3 ml 12.80 ml/m  PULMONIC VALVE PV Vmax:       0.82 m/s PV Vmean:      57.000 cm/s PV VTI:        0.204 m PV Peak grad:  2.7 mmHg PV Mean grad:  2.0 mmHg  MITRAL VALVE MV Area (PHT): 2.79 cm MV Decel Time: 272 msec MV E velocity: 85.70 cm/s MV A velocity: 144.00 cm/s MV E/A ratio:  0.60 Rudean Haskell MD Electronically signed by Rudean Haskell MD Signature Date/Time: 07/05/2021/10:09:36 AM    Final       Scheduled Meds:  anastrozole  1 mg Oral Daily   aspirin EC  81 mg Oral Daily   atorvastatin  80 mg Oral Daily   clopidogrel  75 mg Oral Daily   diclofenac Sodium  2 g Topical QID   DULoxetine  60 mg Oral Daily   enoxaparin (LOVENOX) injection  30 mg Subcutaneous Q24H   febuxostat  40 mg Oral Daily   fluticasone  1 spray Each Nare Daily   fluticasone furoate-vilanterol  1 puff Inhalation Daily   insulin aspart  0-20 Units  Subcutaneous TID WC   insulin aspart  0-5 Units Subcutaneous QHS   insulin glargine-yfgn  20 Units Subcutaneous Daily   iron polysaccharides  150  mg Oral Daily   linaclotide  145 mcg Oral QAC breakfast   loratadine  10 mg Oral Daily   memantine  5 mg Oral Daily   pantoprazole  20 mg Oral Daily   sodium chloride flush  3 mL Intravenous Q12H   Continuous Infusions:   LOS: 0 days     Dessa Phi, DO [facility] 07/05/2021, 1:22 PM   Available via Epic secure chat 7am-7pm After these hours, please refer to coverage provider listed on [email or web address]

## 2021-07-06 DIAGNOSIS — I10 Essential (primary) hypertension: Secondary | ICD-10-CM | POA: Diagnosis not present

## 2021-07-06 DIAGNOSIS — J449 Chronic obstructive pulmonary disease, unspecified: Secondary | ICD-10-CM

## 2021-07-06 DIAGNOSIS — N1832 Chronic kidney disease, stage 3b: Secondary | ICD-10-CM

## 2021-07-06 DIAGNOSIS — K219 Gastro-esophageal reflux disease without esophagitis: Secondary | ICD-10-CM | POA: Diagnosis not present

## 2021-07-06 DIAGNOSIS — C50112 Malignant neoplasm of central portion of left female breast: Secondary | ICD-10-CM

## 2021-07-06 DIAGNOSIS — R55 Syncope and collapse: Secondary | ICD-10-CM | POA: Diagnosis not present

## 2021-07-06 DIAGNOSIS — I25118 Atherosclerotic heart disease of native coronary artery with other forms of angina pectoris: Secondary | ICD-10-CM

## 2021-07-06 DIAGNOSIS — Z86711 Personal history of pulmonary embolism: Secondary | ICD-10-CM

## 2021-07-06 LAB — CBC
HCT: 31.1 % — ABNORMAL LOW (ref 36.0–46.0)
Hemoglobin: 9.7 g/dL — ABNORMAL LOW (ref 12.0–15.0)
MCH: 29.1 pg (ref 26.0–34.0)
MCHC: 31.2 g/dL (ref 30.0–36.0)
MCV: 93.4 fL (ref 80.0–100.0)
Platelets: 249 10*3/uL (ref 150–400)
RBC: 3.33 MIL/uL — ABNORMAL LOW (ref 3.87–5.11)
RDW: 16.4 % — ABNORMAL HIGH (ref 11.5–15.5)
WBC: 12.2 10*3/uL — ABNORMAL HIGH (ref 4.0–10.5)
nRBC: 0 % (ref 0.0–0.2)

## 2021-07-06 LAB — GLUCOSE, CAPILLARY
Glucose-Capillary: 124 mg/dL — ABNORMAL HIGH (ref 70–99)
Glucose-Capillary: 120 mg/dL — ABNORMAL HIGH (ref 70–99)
Glucose-Capillary: 163 mg/dL — ABNORMAL HIGH (ref 70–99)
Glucose-Capillary: 134 mg/dL — ABNORMAL HIGH (ref 70–99)

## 2021-07-06 LAB — POTASSIUM: Potassium: 5.1 mmol/L (ref 3.5–5.1)

## 2021-07-06 LAB — BASIC METABOLIC PANEL
Anion gap: 8 (ref 5–15)
BUN: 59 mg/dL — ABNORMAL HIGH (ref 8–23)
CO2: 19 mmol/L — ABNORMAL LOW (ref 22–32)
Calcium: 8.8 mg/dL — ABNORMAL LOW (ref 8.9–10.3)
Chloride: 112 mmol/L — ABNORMAL HIGH (ref 98–111)
Creatinine, Ser: 1.7 mg/dL — ABNORMAL HIGH (ref 0.44–1.00)
GFR, Estimated: 29 mL/min — ABNORMAL LOW (ref >60)
Glucose, Bld: 131 mg/dL — ABNORMAL HIGH (ref 70–99)
Potassium: 5 mmol/L (ref 3.5–5.1)
Sodium: 139 mmol/L (ref 135–145)

## 2021-07-06 MED ORDER — ENOXAPARIN SODIUM 80 MG/0.8ML IJ SOSY
0.5000 mg/kg | PREFILLED_SYRINGE | INTRAMUSCULAR | Status: DC
  Administered 2021-07-06 – 2021-07-07 (×2): 70 mg via SUBCUTANEOUS
  Filled 2021-07-06 (×2): qty 0.8

## 2021-07-06 MED ORDER — TOPIRAMATE 25 MG PO TABS
25.0000 mg | ORAL_TABLET | Freq: Every day | ORAL | Status: DC
  Administered 2021-07-06 – 2021-07-08 (×3): 25 mg via ORAL
  Filled 2021-07-06 (×4): qty 1

## 2021-07-06 MED ORDER — METOPROLOL SUCCINATE ER 50 MG PO TB24
50.0000 mg | ORAL_TABLET | Freq: Every day | ORAL | Status: DC
  Administered 2021-07-06: 50 mg via ORAL
  Filled 2021-07-06 (×2): qty 1

## 2021-07-06 NOTE — Evaluation (Signed)
Occupational Therapy Evaluation Patient Details Name: [name] MRN: [id number] DOB: Jan 23, 1935 Today's Date: 07/06/2021   History of Present Illness Pt is an 87 y/o female admitted secondary to syncope. PMH includes CKD, HTN, CAD, COPD, breast cancer, and PE.   Clinical Impression   Pt currently requires mod +2 assist for toileting tasks, stand pivot transfers and LB selfcare.  At home she reports staying in the bed most of the time with transfers out of the bed mostly to the [facility] at bedside with her cousin assisting with all bathing and dressing.  Now with her daughter having a recent heart attack she wants to try and get stronger and increase some of her ADL independence.  Feel she will benefit from acute care OT to help progress ADL independence in preparation for discharging home.  Feel she will still need significant assistance, however pt reports being close to her usual baseline for most task as she was fairly sedentary.  Will continue to follow.         Recommendations for follow up therapy are one component of a multi-disciplinary discharge planning process, led by the attending physician.  Recommendations may be updated based on patient status, additional functional criteria and insurance authorization.   Follow Up Recommendations  Home health OT    Assistance Recommended at Discharge PRN  Patient can return home with the following A lot of help with bathing/dressing/bathroom    Functional Status Assessment  Patient has had a recent decline in their functional status and demonstrates the ability to make significant improvements in function in a reasonable and predictable amount of time.  Equipment Recommendations  None recommended by OT       Precautions / Restrictions Precautions Precautions: Fall Restrictions Weight Bearing Restrictions: No      Mobility Bed Mobility Overal bed mobility: Needs Assistance Bed Mobility: Supine to Sit     Supine to sit: Mod  assist, HOB elevated          Transfers Overall transfer level: Needs assistance Equipment used: None Transfers: Sit to/from Stand, Bed to chair/wheelchair/BSC Sit to Stand: Mod assist, +2 physical assistance Stand pivot transfers: Mod assist, +2 safety/equipment                Balance Overall balance assessment: Needs assistance Sitting-balance support: No upper extremity supported, Feet supported Sitting balance-Leahy Scale: Fair     Standing balance support: Bilateral upper extremity supported Standing balance-Leahy Scale: Zero Standing balance comment: Reliant on BUE and external support for standing.                           ADL either performed or assessed with clinical judgement   ADL Overall ADL's : Needs assistance/impaired Eating/Feeding: Minimal assistance;Sitting   Grooming: Minimal assistance;Sitting   Upper Body Bathing: Moderate assistance;Sitting   Lower Body Bathing: +2 for physical assistance;Moderate assistance   Upper Body Dressing : Maximal assistance;Sitting   Lower Body Dressing: +2 for physical assistance;Maximal assistance   Toilet Transfer: Stand-pivot;+2 for safety/equipment;Maximal assistance   Toileting- Clothing Manipulation and Hygiene: +2 for physical assistance;Total assistance;Sit to/from stand       Functional mobility during ADLs: +2 for physical assistance;Moderate assistance General ADL Comments: Pt needed mod +2 for sit to stand from the EOB as well as the 3:1 and recliner.  Increased flexed posture in standing noted as well with BUE support needed throughout transition.  Pt reports that family provided total assist for most  selfcare tasks but she reported wanting to be able to do more for herself as her daughter had a heart attack earlier last month.  Issued foam grips to use over utensils to assist with holding fork/spoon with the dominant right hand as she reports increased pain and difficulty when attemtping to  hold without adaptation.     Vision Baseline Vision/History: 1 Wears glasses;4 Cataracts Ability to See in Adequate Light: 0 Adequate Patient Visual Report: No change from baseline Vision Assessment?: No apparent visual deficits     Perception Perception Perception: Within Functional Limits   Praxis Praxis Praxis: Intact    Pertinent Vitals/Pain Pain Assessment Pain Assessment: Faces Faces Pain Scale: Hurts little more Pain Location: left ankle, knee, and shoulder Pain Descriptors / Indicators: Discomfort Pain Intervention(s): Monitored during session, Other (comment) (sports cream applied by nursing)     Hand Dominance Right   Extremity/Trunk Assessment Upper Extremity Assessment Upper Extremity Assessment: RUE deficits/detail;LUE deficits/detail RUE Deficits / Details: Strength overall 3+/5 throughout except shoulders 2+/5 RUE: Shoulder pain with ROM RUE Sensation: history of peripheral neuropathy RUE Coordination: decreased fine motor LUE Deficits / Details: AROM shoulder flexion approximately 0-110 degrees. AAROM 0-120 degrees with end range pain LUE: Shoulder pain with ROM LUE Sensation: WNL LUE Coordination: decreased fine motor   Lower Extremity Assessment Lower Extremity Assessment: Defer to PT evaluation   Cervical / Trunk Assessment Cervical / Trunk Assessment: Other exceptions Cervical / Trunk Exceptions: increased body habitus   Communication Communication Communication: No difficulties   Cognition Arousal/Alertness: Awake/alert Behavior During Therapy: WFL for tasks assessed/performed Overall Cognitive Status: Within Functional Limits for tasks assessed                                 General Comments: Fearful of falling                Home Living Family/patient expects to be discharged to:: Private residence Living Arrangements: Children Available Help at Discharge: Family;Available 24 hours/day Type of Home: House Home  Access: Ramped entrance     Home Layout: One level     Bathroom Shower/Tub: Occupational psychologist: Handicapped height Bathroom Accessibility: Yes How Accessible: Accessible via wheelchair Home Equipment: Conservation officer, nature (2 wheels);Wheelchair - manual;BSC/3in1;Shower seat   Additional Comments: Pt reports her WC does not work as it should      Prior Functioning/Environment Prior Level of Function : Needs assist       Physical Assist : Mobility (physical);ADLs (physical) Mobility (physical): Transfers;Bed mobility ADLs (physical): Feeding;Grooming;Bathing;Dressing;Toileting Mobility Comments: Uses WC mainly, but does have RW for short distance ambulation. ADLs Comments: Requires assist for ADLs.        OT Problem List: Decreased strength;Decreased range of motion;Decreased activity tolerance;Impaired balance (sitting and/or standing);Decreased coordination;Decreased knowledge of use of DME or AE;Decreased knowledge of precautions;Impaired UE functional use;Impaired sensation;Pain      OT Treatment/Interventions: Self-care/ADL training;Therapeutic exercise;Neuromuscular education;Balance training;Therapeutic activities;DME and/or AE instruction    OT Goals(Current goals can be found in the care plan section) Acute Rehab OT Goals Patient Stated Goal: Pt stated she wanted to be able to do more for herself. OT Goal Formulation: With patient/family Time For Goal Achievement: 07/20/21 Potential to Achieve Goals: Fair  OT Frequency: Min 2X/week    Co-evaluation              AM-PAC OT "6 Clicks" Daily Activity     Outcome Measure  Help from another person eating meals?: A Little Help from another person taking care of personal grooming?: A Little Help from another person toileting, which includes using toliet, bedpan, or urinal?: Total Help from another person bathing (including washing, rinsing, drying)?: Total Help from another person to put on and taking off  regular upper body clothing?: A Lot Help from another person to put on and taking off regular lower body clothing?: Total 6 Click Score: 11   End of Session Equipment Utilized During Treatment: Gait belt Nurse Communication: Mobility status  Activity Tolerance: Patient limited by fatigue Patient left: in chair;with call bell/phone within reach;with family/visitor present;with chair alarm set  OT Visit Diagnosis: Unsteadiness on feet (R26.81);Other abnormalities of gait and mobility (R26.89);Muscle weakness (generalized) (M62.81);Feeding difficulties (R63.3);Pain;Dizziness and giddiness (R42) Pain - Right/Left: Left Pain - part of body: Ankle and joints of foot;Knee                Time: 3300-7622 OT Time Calculation (min): 53 min Charges:  OT General Charges $OT Visit: 1 Visit OT Evaluation $OT Eval Moderate Complexity: 1 Mod OT Treatments $Self Care/Home Management : 38-52 mins   Romie Tay OTR/L 07/06/2021, 3:55 PM

## 2021-07-06 NOTE — Progress Notes (Signed)
MEDICATION RELATED CONSULT NOTE -  ?Pharmac may adjust Enoxaparin (Lovenox) ?Indication: VTE prophylaxis ? ?Allergies  ?Allergen Reactions  ? Sulfonamide Derivatives Swelling  ?  Mouth swelling  ? Aricept [Donepezil Hcl] Diarrhea and Nausea And Vomiting  ?  GI upset/loose stools  ? Tramadol Nausea And Vomiting  ? ? ?Patient Measurements: ?Height: 5\' 7"  (170.2 cm) ?Weight: (!) 140.2 kg (309 lb 1.4 oz) ?IBW/kg (Calculated) : 61.6 ? ? ?Vital Signs: ?Temp: 98.4 ?F (36.9 ?C) (03/01 0957) ?Temp Source: Oral (03/01 0957) ?BP: 131/54 (03/01 0957) ?Pulse Rate: 85 (03/01 0957) ?Labs: ?Recent Labs  ?  07/04/21 ?1323 07/04/21 ?1853 07/05/21 ?0449 07/06/21 ?1607  ?WBC 13.6*  --  19.1* 12.2*  ?HGB 10.4* 10.5* 10.6* 9.7*  ?HCT 34.5* 31.0* 35.2* 31.1*  ?PLT 290  --  274 249  ?CREATININE 1.68*  --  1.69* 1.70*  ?MG 1.7  --   --   --   ?ALBUMIN 3.0*  --   --   --   ?PROT 7.7  --   --   --   ?AST 15  --   --   --   ?ALT 15  --   --   --   ?ALKPHOS 94  --   --   --   ?BILITOT 0.6  --   --   --   ? ?Estimated Creatinine Clearance: 36.2 mL/min (A) (by C-G formula based on SCr of 1.7 mg/dL (H)). ? ? ?Assessment:  Enoxaparin 30 mg sq every 24 hours started for VTE prophylaxis with order that pharmacy may adjust dose.   87 y.o female with obesity. Weight is 140.2 kg,   BMI = 48.4;   SCr 1. 70,   Estimated CrCl is 36 ml/min.  ? ? ?Plan:  ?Adjust Enoxaparin dose to 0.5 mg/kg SQ q24 hours (=70 mg sq q24hr)  for VTE prophylaxis in patient with BMI >30.  ?Follow up renal function , CBC, and monitor for signs and symptoms of bleeding.   ?If renal function worsens to CrCl < 30 ml/min will need to reduce dose.  ? ? ?[name], RPh ?Clinical Pharmacist ?[phone] ?07/06/2021,4:01 PM ?Please check AMION for all [place] phone numbers ?After 10:00 PM, call [place] [phone] ? ? ? ? ?

## 2021-07-06 NOTE — Progress Notes (Addendum)
Patient suffers from Weakness, Syncope which impairs their ability to perform daily activities like bathing and toileting in the home.  A walker will not resolve issue with performing activities of daily living. A Bariatric wheelchair will allow patient to safely perform daily activities. Patient is not able to propel themselves in the home using a standard weight wheelchair due to general weakness. Patient can self propel in the lightweight wheelchair. Length of need 12 months . ?Accessories: elevating leg rests (ELRs), wheel locks, extensions and anti-tippers. ?

## 2021-07-06 NOTE — Progress Notes (Signed)
PROGRESS NOTE    [name]  SFK:[id number] DOB: [birth year] DOA: 07/04/2021 PCP: [name], NP     Brief Narrative:  [name] is a 86 y.o. female with medical history significant of IDDM, COPD, CAD with stenting, chronic HFpEF, history of PE off anticoagulation due to worsening of iron deficiency anemia in 2021, COPD, breast CA on hormone therapy CKD stage IIIb, morbid obesity, multiple OA, gout, chronic ambulation dysfunction, diabetic neuropathy, chronic dizziness on meclizine, presented with syncope.    New events last 24 hours / Subjective: Pt denies any new complaints, denies any chest pain, SOB, abdominal pain, N/V, cough, fever. Of note, pt is mostly bedbound, uses a wheelchair for ambulation. Unable to perform orthostatics.    Assessment & Plan:   Principal Problem:   Syncope Active Problems:   Essential hypertension   Coronary artery disease of native artery of native heart with stable angina pectoris (HCC)   GERD (gastroesophageal reflux disease)   COPD (chronic obstructive pulmonary disease) (HCC)   History of pulmonary embolus (PE)   CKD (chronic kidney disease) stage 3, GFR 30-59 ml/min (HCC)   Dyslipidemia associated with type 2 diabetes mellitus (HCC)   Breast cancer (HCC)   Hyperkalemia   Syncope, vasovagal   Syncope Possibly 2/2 antihypertensive medications with underlying deconditioning Echocardiogram limited due to body habitus and suboptimal imaging.  Aortic valve was not well visualized, EF 55-60%, grade 1DD, no RWMA VQ scan negative Unable to check orthostatic vital sign due to bedbound status and significant deconditioning PT/OT   Hypertension Holding home medications including hydralazine, Lasix, Imdur, coreg Restart toprol  Leukocytosis Currently afebrile, WBC improving UA with trace leuko, many bacteria, WBC 0-5 CXR with ?possible interstitial PNA, asymptomatic Procalcitonin pending Monitor CBC  CKD stage IIIb Baseline  Cr around 1.6  Daily BMP  Anemia of CKD Anemia panel pending Daily CBC  History of PE Patient has been off Xarelto since 2021 due to worsening of iron deficiency anemia  CAD Continue aspirin and Plavix, lipitor   COPD Stable  Diabetes mellitus type 2 Hemoglobin a1c 7.0 Continue Semglee, sliding scale insulin  Gout Continue Uloric   History of breast cancer Continue anastrozole  Dementia, mild Continue memantine  Morbid obesity Lifestyle modification advised    DVT prophylaxis:  enoxaparin (LOVENOX) injection 30 mg Start: 07/04/21 2000  Code Status: DNR  Family Communication: Discussed with daughter at bedside Disposition Plan:  Status is: Observation The patient will require care spanning > 2 midnights and should be moved to inpatient because of level of care     Antimicrobials:  Anti-infectives (From admission, onward)    None        Objective: Vitals:   07/05/21 2338 07/06/21 0327 07/06/21 0327 07/06/21 0957  BP: (!) 134/47  (!) 141/51 (!) 131/54  Pulse: 86  84 85  Resp: 20  17 16   Temp: 98.3 F (36.8 C)  98.4 F (36.9 C) 98.4 F (36.9 C)  TempSrc:    Oral  SpO2: 96%  98% 98%  Weight:  (!) 140.2 kg    Height:        Intake/Output Summary (Last 24 hours) at 07/06/2021 1314 Last data filed at 07/06/2021 0600 Gross per 24 hour  Intake 480 ml  Output 400 ml  Net 80 ml   Filed Weights   07/05/21 0917 07/06/21 0327  Weight: (!) 151 kg (!) 140.2 kg    Examination:  General: NAD, alert, oriented  Cardiovascular: S1, S2 present  Respiratory: CTAB Abdomen: Soft, nontender, nondistended, obese, bowel sounds present Musculoskeletal: No bilateral pedal edema noted Skin: Normal Psychiatry: Normal mood    Data Reviewed: I have personally reviewed following labs and imaging studies  CBC: Recent Labs  Lab 07/04/21 1323 07/04/21 1853 07/05/21 0449 07/06/21 0737  WBC 13.6*  --  19.1* 12.2*  NEUTROABS 9.6*  --   --   --   HGB 10.4*  10.5* 10.6* 9.7*  HCT 34.5* 31.0* 35.2* 31.1*  MCV 95.8  --  95.4 93.4  PLT 290  --  274 539   Basic Metabolic Panel: Recent Labs  Lab 07/04/21 1323 07/04/21 1853 07/05/21 0449 07/05/21 1235 07/05/21 1736 07/05/21 2118 07/06/21 0335 07/06/21 0737  NA 140 140 138  --   --   --   --  139  K 5.1 5.9* 5.8* 5.5* 5.6* 5.1 5.1 5.0  CL 108  --  111  --   --   --   --  112*  CO2 23  --  18*  --   --   --   --  19*  GLUCOSE 98  --  148*  --   --   --   --  131*  BUN 57*  --  60*  --   --   --   --  59*  CREATININE 1.68*  --  1.69*  --   --   --   --  1.70*  CALCIUM 9.1  --  8.9  --   --   --   --  8.8*  MG 1.7  --   --   --   --   --   --   --    GFR: Estimated Creatinine Clearance: 36.2 mL/min (A) (by C-G formula based on SCr of 1.7 mg/dL (H)). Liver Function Tests: Recent Labs  Lab 07/04/21 1323  AST 15  ALT 15  ALKPHOS 94  BILITOT 0.6  PROT 7.7  ALBUMIN 3.0*   No results for input(s): LIPASE, AMYLASE in the last 168 hours. No results for input(s): AMMONIA in the last 168 hours. Coagulation Profile: No results for input(s): INR, PROTIME in the last 168 hours. Cardiac Enzymes: Recent Labs  Lab 07/04/21 2000  CKTOTAL 48   BNP (last 3 results) No results for input(s): PROBNP in the last 8760 hours. HbA1C: Recent Labs    07/04/21 2005  HGBA1C 7.0*   CBG: Recent Labs  Lab 07/05/21 1140 07/05/21 1654 07/05/21 2018 07/06/21 0724 07/06/21 1151  GLUCAP 113* 180* 157* 124* 120*   Lipid Profile: No results for input(s): CHOL, HDL, LDLCALC, TRIG, CHOLHDL, LDLDIRECT in the last 72 hours. Thyroid Function Tests: Recent Labs    07/05/21 0449  TSH 1.194   Anemia Panel: No results for input(s): VITAMINB12, FOLATE, FERRITIN, TIBC, IRON, RETICCTPCT in the last 72 hours. Sepsis Labs: No results for input(s): PROCALCITON, LATICACIDVEN in the last 168 hours.  Recent Results (from the past 240 hour(s))  Resp Panel by RT-PCR (Flu A&B, Covid) Nasopharyngeal Swab      Status: None   Collection Time: 07/04/21  1:21 PM   Specimen: Nasopharyngeal Swab; Nasopharyngeal(NP) swabs in vial transport medium  Result Value Ref Range Status   SARS Coronavirus 2 by RT PCR NEGATIVE NEGATIVE Final    Comment: (NOTE) SARS-CoV-2 target nucleic acids are NOT DETECTED.  The SARS-CoV-2 RNA is generally detectable in upper respiratory specimens during the acute phase of infection. The lowest concentration of SARS-CoV-2 viral copies  this assay can detect is 138 copies/mL. A negative result does not preclude SARS-Cov-2 infection and should not be used as the sole basis for treatment or other patient management decisions. A negative result may occur with  improper specimen collection/handling, submission of specimen other than nasopharyngeal swab, presence of viral mutation(s) within the areas targeted by this assay, and inadequate number of viral copies(<138 copies/mL). A negative result must be combined with clinical observations, patient history, and epidemiological information. The expected result is Negative.  Fact Sheet for Patients:  EntrepreneurPulse.com.au  Fact Sheet for Healthcare Providers:  IncredibleEmployment.be  This test is no t yet approved or cleared by the [place] FDA and  has been authorized for detection and/or diagnosis of SARS-CoV-2 by FDA under an Emergency Use Authorization (EUA). This EUA will remain  in effect (meaning this test can be used) for the duration of the COVID-19 declaration under Section 564(b)(1) of the Act, 21 U.S.C.section 360bbb-3(b)(1), unless the authorization is terminated  or revoked sooner.       Influenza A by PCR NEGATIVE NEGATIVE Final   Influenza B by PCR NEGATIVE NEGATIVE Final    Comment: (NOTE) The Xpert Xpress SARS-CoV-2/FLU/RSV plus assay is intended as an aid in the diagnosis of influenza from Nasopharyngeal swab specimens and should not be used as a sole basis  for treatment. Nasal washings and aspirates are unacceptable for Xpert Xpress SARS-CoV-2/FLU/RSV testing.  Fact Sheet for Patients: EntrepreneurPulse.com.au  Fact Sheet for Healthcare Providers: IncredibleEmployment.be  This test is not yet approved or cleared by the [place] FDA and has been authorized for detection and/or diagnosis of SARS-CoV-2 by FDA under an Emergency Use Authorization (EUA). This EUA will remain in effect (meaning this test can be used) for the duration of the COVID-19 declaration under Section 564(b)(1) of the Act, 21 U.S.C. section 360bbb-3(b)(1), unless the authorization is terminated or revoked.  Performed at [facility], [place] [address], [place], [place] [address]       Radiology Studies: DG Shoulder 1V Left  Result Date: 07/04/2021 CLINICAL DATA:  Shoulder pain. EXAM: LEFT SHOULDER COMPARISON:  None. FINDINGS: There is no evidence of acute fracture or dislocation. Examination is limited due to single view. Moderate to severe degenerative changes are noted at the glenohumeral and acromioclavicular joints. Soft tissues are unremarkable. IMPRESSION: Moderate to severe degenerative changes at the glenohumeral and acromioclavicular joints. Electronically Signed   By: Brett Fairy M.D.   On: 07/04/2021 21:31   CT HEAD WO CONTRAST  Result Date: 07/04/2021 CLINICAL DATA:  Syncope. EXAM: CT HEAD WITHOUT CONTRAST TECHNIQUE: Contiguous axial images were obtained from the base of the skull through the vertex without intravenous contrast. RADIATION DOSE REDUCTION: This exam was performed according to the departmental dose-optimization program which includes automated exposure control, adjustment of the mA and/or kV according to patient size and/or use of iterative reconstruction technique. COMPARISON:  CT 08/27/2018 FINDINGS: Brain: No acute intracranial hemorrhage. No focal mass lesion. No CT evidence of acute infarction.  No midline shift or mass effect. No hydrocephalus. Basilar cisterns are patent. There are periventricular and subcortical white matter hypodensities. Generalized cortical atrophy. Vascular: No hyperdense vessel or unexpected calcification. Skull: Normal. Negative for fracture or focal lesion. Sinuses/Orbits: Paranasal sinuses and mastoid air cells are clear. Orbits are clear. Other: None. IMPRESSION: 1. No acute intracranial findings. 2. Extensive white matter microvascular disease. 3. Mild atrophy Electronically Signed   By: Suzy Bouchard M.D.   On: 07/04/2021 15:04   CT  Chest Wo Contrast  Result Date: 07/04/2021 CLINICAL DATA:  Syncope EXAM: CT CHEST WITHOUT CONTRAST TECHNIQUE: Multidetector CT imaging of the chest was performed following the standard protocol without IV contrast. RADIATION DOSE REDUCTION: This exam was performed according to the departmental dose-optimization program which includes automated exposure control, adjustment of the mA and/or kV according to patient size and/or use of iterative reconstruction technique. COMPARISON:  07/04/2021, 06/14/2015 FINDINGS: Cardiovascular: Limited unenhanced imaging of the heart and great vessels demonstrates no pericardial effusion. There is extensive atherosclerosis throughout the coronary vasculature. Normal caliber of the thoracic aorta, with mild atherosclerosis of the aortic arch. Evaluation of the vascular lumen is limited without IV contrast. Mediastinum/Nodes: No enlarged mediastinal or axillary lymph nodes. Thyroid gland, trachea, and esophagus demonstrate no significant findings. Stable postsurgical changes at the gastroesophageal junction, with small hiatal hernia. Lungs/Pleura: No acute airspace disease, effusion, or pneumothorax. Central airways are patent. Upper Abdomen: No acute abnormality. Musculoskeletal: No acute or destructive bony lesions. Reconstructed images demonstrate no additional findings. IMPRESSION: 1. No acute intrathoracic  process. 2. Aortic Atherosclerosis (ICD10-I70.0). Coronary artery atherosclerosis. Electronically Signed   By: Randa Ngo M.D.   On: 07/04/2021 15:03   NM Pulmonary Perf and Vent  Result Date: 07/05/2021 CLINICAL DATA:  Positive D-dimer, pulmonary embolism suspected EXAM: NUCLEAR MEDICINE PERFUSION LUNG SCAN TECHNIQUE: Perfusion images were obtained in multiple projections after intravenous injection of radiopharmaceutical. Ventilation scans intentionally deferred if perfusion scan and chest x-ray adequate for interpretation during COVID 19 epidemic. RADIOPHARMACEUTICALS:  4.1 mCi Tc-32m MAA IV COMPARISON:  03/03/2020 FINDINGS: There are no discrete wedge-shaped perfusion defects. No significant interval changes are noted. IMPRESSION: There are no wedge-shaped perfusion defects. There are no imaging signs of pulmonary embolism. Electronically Signed   By: Elmer Picker M.D.   On: 07/05/2021 13:09   ECHOCARDIOGRAM COMPLETE  Result Date: 07/05/2021    ECHOCARDIOGRAM REPORT   Patient Name:   [name] Date of Exam: 07/05/2021 Medical Rec #:  [id number]       Height:       67.0 in Accession #:    [id number]      Weight:       313.0 lb Date of Birth:  [birth year]       BSA:          2.446 m Patient Age:    71 years        BP:           125/55 mmHg Patient Gender: F               HR:           91 bpm. Exam Location:  Inpatient Procedure: 2D Echo, Limited Echo, Cardiac Doppler and Color Doppler Indications:    R55 SYNCOPE  History:        Patient has prior history of Echocardiogram examinations, most                 recent 02/15/2020. CAD and Previous Myocardial Infarction; Risk                 Factors:Hypertension and Dyslipidemia.  Sonographer:    Beryle Beams Referring Phys: [id number] PING T ZHANG  Sonographer Comments: Suboptimal parasternal window, suboptimal apical window, no subcostal window and patient is morbidly obese. Image acquisition challenging due to patient body habitus and Image  acquisition challenging due to respiratory motion. PATIENT UNABLEO ROLL ON SIDE DUE TO BODY HABITUS IMPRESSIONS  1. Left ventricular ejection fraction, by  estimation, is 55 to 60%. The left ventricle has normal function. Left ventricular endocardial border not optimally defined to evaluate regional wall motion- there are no wall motion abnormalities seen in views obtained. Left ventricular diastolic parameters are consistent with Grade I diastolic dysfunction (impaired relaxation).  2. Right ventricular systolic function is normal. The right ventricular size is normal.  3. The mitral valve was not well visualized. No evidence of mitral valve regurgitation. No evidence of mitral stenosis.  4. The aortic valve was not well visualized. Aortic valve regurgitation is not visualized. Comparison(s): Difficult comparison- this is a technically difficult study. FINDINGS  Left Ventricle: Left ventricular ejection fraction, by estimation, is 55 to 60%. The left ventricle has normal function. Left ventricular endocardial border not optimally defined to evaluate regional wall motion. The left ventricular internal cavity size was normal in size. There is no left ventricular hypertrophy. Left ventricular diastolic parameters are consistent with Grade I diastolic dysfunction (impaired relaxation). Right Ventricle: The right ventricular size is normal. Right vetricular wall thickness was not well visualized. Right ventricular systolic function is normal. Left Atrium: Left atrial size was normal in size. Right Atrium: Right atrial size was normal in size. Pericardium: There is no evidence of pericardial effusion. Presence of epicardial fat layer. Mitral Valve: The mitral valve was not well visualized. No evidence of mitral valve regurgitation. No evidence of mitral valve stenosis. Tricuspid Valve: The tricuspid valve is not well visualized. Tricuspid valve regurgitation is not demonstrated. No evidence of tricuspid stenosis. Aortic  Valve: The aortic valve was not well visualized. Aortic valve regurgitation is not visualized. Pulmonic Valve: The pulmonic valve was not well visualized. Pulmonic valve regurgitation is not visualized. Aorta: The ascending aorta was not well visualized. IAS/Shunts: The interatrial septum was not well visualized.   LV Volumes (MOD) LV vol d, MOD A2C: 36.1 ml LV vol d, MOD A4C: 54.7 ml LV vol s, MOD A2C: 15.2 ml LV vol s, MOD A4C: 27.0 ml LV SV MOD A2C:     20.9 ml LV SV MOD A4C:     54.7 ml LV SV MOD BP:      24.5 ml LEFT ATRIUM             Index LA Vol (A2C):   37.9 ml 15.49 ml/m LA Vol (A4C):   23.6 ml 9.65 ml/m LA Biplane Vol: 31.3 ml 12.80 ml/m  PULMONIC VALVE PV Vmax:       0.82 m/s PV Vmean:      57.000 cm/s PV VTI:        0.204 m PV Peak grad:  2.7 mmHg PV Mean grad:  2.0 mmHg  MITRAL VALVE MV Area (PHT): 2.79 cm MV Decel Time: 272 msec MV E velocity: 85.70 cm/s MV A velocity: 144.00 cm/s MV E/A ratio:  0.60 Rudean Haskell MD Electronically signed by Rudean Haskell MD Signature Date/Time: 07/05/2021/10:09:36 AM    Final       Scheduled Meds:  anastrozole  1 mg Oral Daily   aspirin EC  81 mg Oral Daily   atorvastatin  80 mg Oral Daily   clopidogrel  75 mg Oral Daily   diclofenac Sodium  2 g Topical QID   DULoxetine  60 mg Oral Daily   enoxaparin (LOVENOX) injection  30 mg Subcutaneous Q24H   febuxostat  40 mg Oral Daily   fluticasone  1 spray Each Nare Daily   fluticasone furoate-vilanterol  1 puff Inhalation Daily   insulin aspart  0-20 Units  Subcutaneous TID WC   insulin aspart  0-5 Units Subcutaneous QHS   insulin glargine-yfgn  20 Units Subcutaneous Daily   iron polysaccharides  150 mg Oral Daily   linaclotide  145 mcg Oral QAC breakfast   loratadine  10 mg Oral Daily   memantine  5 mg Oral Daily   pantoprazole  20 mg Oral Daily   sodium chloride flush  3 mL Intravenous Q12H   Continuous Infusions:   LOS: 0 days     Alma Friendly, MD [facility] 07/06/2021, 1:14 PM   Available via Epic secure chat 7am-7pm After these hours, please refer to coverage provider listed on [email or web address]

## 2021-07-06 NOTE — Progress Notes (Signed)
Unable to obtain orthostatic vitals due to patient being bed bound. This is her baseline. MD aware.  ?

## 2021-07-06 NOTE — TOC Initial Note (Signed)
Transition of Care [facility]) - Initial/Assessment Note    Patient Details  Name: [name] MRN: [id number] Date of Birth: 08-22-1934  Transition of Care [facility]) CM/SW Contact:    Tom-Johnson, Renea Ee, RN Phone Number: 07/06/2021, 1:15 PM  Clinical Narrative:                  CM spoke with patient at bedside about needs for post hospital transition. Admitted for Syncope. Patient states she lives at home with her daughter, [name]. Has three adult children and they transports her to and from her appointments and does her errands. Has a walker, shower seat and old wheelchair. Requesting a new wheelchair. Order placed and called in to [employer] per patient's choice and Freda Munro to deliver at bedside. Home health PT/OT recommended. Referral called to [facility] as patient was active with them in 2021 and would like to use their disciplines again. Cory accepted referral. Information on AVS. PCP is [place], Carlos American, NP and uses [employer] [facility] on [address] and also [employer] order. Daughter to transport at discharge. CM will continue to follow with needs.  Expected Discharge Plan: [place] Barriers to Discharge: Continued Medical Work up   Patient Goals and CMS Choice Patient states their goals for this hospitalization and ongoing recovery are:: To return home CMS Medicare.gov Compare Post Acute Care list provided to:: Patient Choice offered to / list presented to : Patient  Expected Discharge Plan and Services Expected Discharge Plan: [place]   Discharge Planning Services: CM Consult Post Acute Care Choice: [place] arrangements for the past 2 months: Single Family Home                 DME Arranged: Wheelchair manual (Bariatric) DME Agency: [employer] Date DME Agency Contacted: 07/06/21 Time DME Agency Contacted: 93 Representative spoke with at DME Agency: Freda Munro HH Arranged: PT, OT          Prior Living  Arrangements/Services Living arrangements for the past 2 months: [place] Lives with:: Adult Children (Daughter, [name]) Patient language and need for interpreter reviewed:: Yes Do you feel safe going back to the place where you live?: Yes      Need for Family Participation in Patient Care: Yes (Comment) Care giver support system in place?: Yes (comment) Current home services: DME Gilford Rile, showerseat, old wheelchair.) Criminal Activity/Legal Involvement Pertinent to Current Situation/Hospitalization: No - Comment as needed  Activities of Daily Living Home Assistive Devices/Equipment: Cane (specify quad or straight), Dentures (specify type), Shower chair with back, Walker (specify type) ADL Screening (condition at time of admission) Patient's cognitive ability adequate to safely complete daily activities?: Yes Is the patient deaf or have difficulty hearing?: Yes Does the patient have difficulty seeing, even when wearing glasses/contacts?: No Does the patient have difficulty concentrating, remembering, or making decisions?: No Patient able to express need for assistance with ADLs?: Yes Does the patient have difficulty dressing or bathing?: Yes Independently performs ADLs?: No Communication: Needs assistance Is this a change from baseline?: Change from baseline, expected to last <3 days Dressing (OT): Needs assistance Is this a change from baseline?: Change from baseline, expected to last <3days Grooming: Needs assistance Is this a change from baseline?: Change from baseline, expected to last <3 days Feeding: Independent Bathing: Needs assistance Is this a change from baseline?: Change from baseline, expected to last <3 days Toileting: Needs assistance Is this a change from baseline?: Change from baseline,  expected to last <3 days In/Out Bed: Needs assistance Is this a change from baseline?: Change from baseline, expected to last <3 days Walks in Home: Independent with device  (comment) Does the patient have difficulty walking or climbing stairs?: Yes Weakness of Legs: Both Weakness of Arms/Hands: None  Permission Sought/Granted Permission sought to share information with : Case Manager, Customer service manager, Family Supports Permission granted to share information with : Yes, Verbal Permission Granted              Emotional Assessment Appearance:: Appears stated age Attitude/Demeanor/Rapport: Engaged, Gracious Affect (typically observed): Accepting, Appropriate, Calm, Hopeful Orientation: : Oriented to Self, Oriented to Place, Oriented to  Time, Oriented to Situation Alcohol / Substance Use: Not Applicable Psych Involvement: No (comment)  Admission diagnosis:  Syncope [R55] Shoulder pain [M25.519] Syncope, unspecified syncope type [R55] Patient Active Problem List   Diagnosis Date Noted   Syncope, vasovagal 07/04/2021   Syncope 07/04/2021   Right elbow pain 04/27/2021   Chronic pain of right hand 04/27/2021   Iron deficiency anemia 09/13/2020   Bilateral primary osteoarthritis of knee 04/12/2020   Pain management contract signed 04/12/2020   Chronic radicular lumbar pain 03/18/2020   Lumbar spondylosis 03/18/2020   Spinal stenosis, lumbar region, with neurogenic claudication 03/18/2020   Presence of stent in right coronary artery    Chest pain on exertion 02/14/2020   Moderate nonproliferative diabetic retinopathy of both eyes without macular edema associated with type 2 diabetes mellitus ([place]) 08/27/2019   Type 2 diabetes mellitus with hyperglycemia, with long-term current use of insulin ([place]) 03/25/2019   Type 2 diabetes mellitus with diabetic neuropathy, with long-term current use of insulin ([place]) 12/13/2017   Hyperkalemia 07/26/2017   Anxiety associated with depression 06/08/2017   GERD with esophagitis 11/15/2016   Memory loss 11/15/2016   Multiple benign nevi 11/15/2016   Esophageal dysphagia 11/07/2016   Severe episode of  recurrent major depressive disorder, without psychotic features ([place]) 06/23/2016   Endometrial polyp 06/23/2016   Vaginitis and vulvovaginitis 06/23/2016   Cough variant asthma 11/17/2015   Vitamin D deficiency 08/02/2015   Breast cancer ([place]) 04/26/2015   Dyslipidemia associated with type 2 diabetes mellitus ([place]) 02/06/2015   Type 2 diabetes mellitus with stage 3b chronic kidney disease, with long-term current use of insulin ([place]) 02/06/2015   Chronic pain syndrome 02/06/2015   Morbid obesity with BMI of 50.0-59.9, adult ([place]) 12/18/2014   CKD (chronic kidney disease) stage 3, GFR 30-59 ml/min (HCC) 12/18/2014   Gout 10/27/2014   Breast mass 10/27/2014   Encounter for therapeutic drug monitoring 10/08/2014   Vertigo 09/24/2014   Chronic bronchitis ([place]) 09/23/2014   DOE (dyspnea on exertion) 06/24/2014   CN (constipation) 05/10/2014   History of pulmonary embolus (PE) 04/22/2014   COPD (chronic obstructive pulmonary disease) ([place]) 04/21/2014   Estrogen deficiency 02/19/2014   Breast cancer screening 02/19/2014   Weight gain 02/19/2014   Pain in the chest 12/31/2013   Type 2 diabetes mellitus with diabetic foot deformity ([place]) 07/29/2013   OSA (obstructive sleep apnea) 02/24/2013   Need for prophylactic vaccination and inoculation against influenza 01/08/2013   Insomnia 09/17/2012   Debility 08/01/2012   Osteoarthritis 08/01/2012   Dysphagia, unspecified(787.20) 07/31/2012   GERD (gastroesophageal reflux disease) 07/31/2012   Long term current use of anticoagulant therapy 02/02/2012   Chronic diastolic congestive heart failure ([place]) 01/29/2012   History of deep venous thrombosis 01/27/2012   Abdominal pain 01/26/2012   Situational depression 06/04/2009  ORTHOPNEA 03/09/2009   Morbid (severe) obesity due to excess calories ([place]) 02/15/2009   Microcytic anemia 02/15/2009   TENSION HEADACHE 01/07/2008   Headache 01/07/2008   Fungal dermatitis 11/13/2007   URINARY  INCONTINENCE 08/29/2007   Coronary artery disease of native artery of native heart with stable angina pectoris ([place])    Hyperlipidemia 12/03/2006   Essential hypertension 12/03/2006   Personal history of other diseases of the digestive system 12/03/2006   PCP:  [name], NP Pharmacy:   [facility] [place], [place] - [address] [place] [address] Phone: [phone] Fax: [phone]     Social Determinants of Health ([place]) Interventions    Readmission Risk Interventions Readmission Risk Prevention Plan 02/18/2020 01/27/2019  Transportation Screening Complete Complete  Medication Review ([place]) Complete Complete  PCP or Specialist appointment within 3-5 days of discharge Complete Complete  HRI or [place] Complete Complete  SW Recovery Care/Counseling Consult Complete Complete  Palliative Care Screening Not Applicable Not [place] Not Applicable Not Applicable  Some recent data might be hidden

## 2021-07-07 DIAGNOSIS — C50112 Malignant neoplasm of central portion of left female breast: Secondary | ICD-10-CM | POA: Diagnosis not present

## 2021-07-07 DIAGNOSIS — I10 Essential (primary) hypertension: Secondary | ICD-10-CM | POA: Diagnosis not present

## 2021-07-07 DIAGNOSIS — K219 Gastro-esophageal reflux disease without esophagitis: Secondary | ICD-10-CM | POA: Diagnosis not present

## 2021-07-07 DIAGNOSIS — J449 Chronic obstructive pulmonary disease, unspecified: Secondary | ICD-10-CM | POA: Diagnosis not present

## 2021-07-07 DIAGNOSIS — R55 Syncope and collapse: Secondary | ICD-10-CM | POA: Diagnosis not present

## 2021-07-07 DIAGNOSIS — I25118 Atherosclerotic heart disease of native coronary artery with other forms of angina pectoris: Secondary | ICD-10-CM | POA: Diagnosis not present

## 2021-07-07 DIAGNOSIS — N1832 Chronic kidney disease, stage 3b: Secondary | ICD-10-CM | POA: Diagnosis not present

## 2021-07-07 DIAGNOSIS — Z86711 Personal history of pulmonary embolism: Secondary | ICD-10-CM | POA: Diagnosis not present

## 2021-07-07 LAB — CBC WITH DIFFERENTIAL/PLATELET
Abs Immature Granulocytes: 0.04 10*3/uL (ref 0.00–0.07)
Basophils Absolute: 0 10*3/uL (ref 0.0–0.1)
Basophils Relative: 0 %
Eosinophils Absolute: 0.3 10*3/uL (ref 0.0–0.5)
Eosinophils Relative: 2 %
HCT: 31.6 % — ABNORMAL LOW (ref 36.0–46.0)
Hemoglobin: 9.8 g/dL — ABNORMAL LOW (ref 12.0–15.0)
Immature Granulocytes: 0 %
Lymphocytes Relative: 27 %
Lymphs Abs: 3.5 10*3/uL (ref 0.7–4.0)
MCH: 29.3 pg (ref 26.0–34.0)
MCHC: 31 g/dL (ref 30.0–36.0)
MCV: 94.6 fL (ref 80.0–100.0)
Monocytes Absolute: 0.8 10*3/uL (ref 0.1–1.0)
Monocytes Relative: 6 %
Neutro Abs: 8.5 10*3/uL — ABNORMAL HIGH (ref 1.7–7.7)
Neutrophils Relative %: 65 %
Platelets: 253 10*3/uL (ref 150–400)
RBC: 3.34 MIL/uL — ABNORMAL LOW (ref 3.87–5.11)
RDW: 16.3 % — ABNORMAL HIGH (ref 11.5–15.5)
WBC: 13.2 10*3/uL — ABNORMAL HIGH (ref 4.0–10.5)
nRBC: 0 % (ref 0.0–0.2)

## 2021-07-07 LAB — IRON AND TIBC
Iron: 54 ug/dL (ref 28–170)
Saturation Ratios: 27 % (ref 10.4–31.8)
TIBC: 203 ug/dL — ABNORMAL LOW (ref 250–450)
UIBC: 149 ug/dL

## 2021-07-07 LAB — BASIC METABOLIC PANEL
Anion gap: 11 (ref 5–15)
BUN: 56 mg/dL — ABNORMAL HIGH (ref 8–23)
CO2: 18 mmol/L — ABNORMAL LOW (ref 22–32)
Calcium: 8.9 mg/dL (ref 8.9–10.3)
Chloride: 111 mmol/L (ref 98–111)
Creatinine, Ser: 1.71 mg/dL — ABNORMAL HIGH (ref 0.44–1.00)
GFR, Estimated: 29 mL/min — ABNORMAL LOW (ref >60)
Glucose, Bld: 123 mg/dL — ABNORMAL HIGH (ref 70–99)
Potassium: 5 mmol/L (ref 3.5–5.1)
Sodium: 140 mmol/L (ref 135–145)

## 2021-07-07 LAB — VITAMIN B12: Vitamin B-12: 375 pg/mL (ref 180–914)

## 2021-07-07 LAB — GLUCOSE, CAPILLARY
Glucose-Capillary: 113 mg/dL — ABNORMAL HIGH (ref 70–99)
Glucose-Capillary: 175 mg/dL — ABNORMAL HIGH (ref 70–99)
Glucose-Capillary: 175 mg/dL — ABNORMAL HIGH (ref 70–99)
Glucose-Capillary: 172 mg/dL — ABNORMAL HIGH (ref 70–99)

## 2021-07-07 LAB — FOLATE: Folate: 4.3 ng/mL — ABNORMAL LOW (ref >5.9)

## 2021-07-07 LAB — FERRITIN: Ferritin: 311 ng/mL — ABNORMAL HIGH (ref 11–307)

## 2021-07-07 LAB — PROCALCITONIN: Procalcitonin: <0.1 ng/mL

## 2021-07-07 MED ORDER — PREGABALIN 25 MG PO CAPS
25.0000 mg | ORAL_CAPSULE | Freq: Two times a day (BID) | ORAL | Status: DC
  Administered 2021-07-07 – 2021-07-08 (×2): 25 mg via ORAL
  Filled 2021-07-07 (×2): qty 1

## 2021-07-07 MED ORDER — OXYCODONE HCL 5 MG PO TABS
5.0000 mg | ORAL_TABLET | Freq: Two times a day (BID) | ORAL | Status: DC | PRN
  Administered 2021-07-07: 5 mg via ORAL
  Filled 2021-07-07: qty 1

## 2021-07-07 MED ORDER — ISOSORBIDE MONONITRATE ER 60 MG PO TB24
60.0000 mg | ORAL_TABLET | Freq: Every day | ORAL | Status: DC
Start: 2021-07-07 — End: 2021-07-08
  Administered 2021-07-07 – 2021-07-08 (×2): 60 mg via ORAL
  Filled 2021-07-07 (×2): qty 1

## 2021-07-07 MED ORDER — FOLIC ACID 1 MG PO TABS
1.0000 mg | ORAL_TABLET | Freq: Every day | ORAL | Status: DC
  Administered 2021-07-07 – 2021-07-08 (×2): 1 mg via ORAL
  Filled 2021-07-07 (×2): qty 1

## 2021-07-07 MED ORDER — VITAMIN B-12 100 MCG PO TABS
500.0000 ug | ORAL_TABLET | Freq: Every day | ORAL | Status: DC
  Administered 2021-07-07 – 2021-07-08 (×2): 500 ug via ORAL
  Filled 2021-07-07 (×2): qty 5

## 2021-07-07 MED ORDER — CARVEDILOL 12.5 MG PO TABS
12.5000 mg | ORAL_TABLET | Freq: Two times a day (BID) | ORAL | Status: DC
  Administered 2021-07-07 – 2021-07-08 (×3): 12.5 mg via ORAL
  Filled 2021-07-07 (×3): qty 1

## 2021-07-07 NOTE — Progress Notes (Signed)
PROGRESS NOTE    [name]  TDV:[id number] DOB: 07/09/1934 DOA: 07/04/2021 PCP: [name], NP     Brief Narrative:  [name] is a 87 y.o. female with medical history significant of IDDM, COPD, CAD with stenting, chronic HFpEF, history of PE off anticoagulation due to worsening of iron deficiency anemia in 2021, COPD, breast CA on hormone therapy CKD stage IIIb, morbid obesity, multiple OA, gout, chronic ambulation dysfunction, diabetic neuropathy, chronic dizziness on meclizine, presented with syncope.    New events last 24 hours / Subjective: Patient still complaining of some mild dizziness while laying in bed, although improving.  Patient complains of bilateral lower extremity 10/10 pain.  At home oxycodone and pregabalin.  Monitoring BP closely while restarting her some of her BP meds    Assessment & Plan:   Principal Problem:   Syncope Active Problems:   Essential hypertension   Coronary artery disease of native artery of native heart with stable angina pectoris (HCC)   GERD (gastroesophageal reflux disease)   COPD (chronic obstructive pulmonary disease) (HCC)   History of pulmonary embolus (PE)   CKD (chronic kidney disease) stage 3, GFR 30-59 ml/min (HCC)   Dyslipidemia associated with type 2 diabetes mellitus (HCC)   Breast cancer (HCC)   Hyperkalemia   Syncope, vasovagal   Syncope Possibly 2/2 antihypertensive medications with underlying deconditioning Echocardiogram limited due to body habitus and suboptimal imaging.  Aortic valve was not well visualized, EF 55-60%, grade 1DD, no RWMA VQ scan negative Unable to check orthostatic vital sign due to bedbound status and significant deconditioning PT/OT   Hypertension BP currently stable Meds were recently adjusted by cardiology in 1/23, unsure if patient's was aware Cardiology recommended to switch to Coreg 25 mg twice daily, continue Imdur, Lasix as needed and discontinue metoprolol and  hydralazine Currently on Coreg 12.5 twice daily, Imdur  Leukocytosis Currently afebrile, WBC improving UA with trace leuko, many bacteria, WBC 0-5 CXR with ?possible interstitial PNA, asymptomatic Procalcitonin <0.10 Monitor CBC  CKD stage IIIb Baseline Cr around 1.6  Daily BMP  Anemia of CKD Folic acid deficiency Anemia panel showed iron 54, folate 4.3, vitamin G62 694 Start folic acid, vitamin W54 Daily CBC  History of PE Patient has been off Xarelto since 2021 due to worsening of iron deficiency anemia  CAD Continue aspirin and Plavix, lipitor   COPD Stable  Diabetes mellitus type 2 Hemoglobin a1c 7.0 Continue Semglee, sliding scale insulin  Gout Continue Uloric   History of breast cancer Continue anastrozole  Dementia, mild Continue memantine  Morbid obesity Lifestyle modification advised    DVT prophylaxis: Lovenox   Code Status: DNR  Family Communication: Discussed with daughter at bedside Disposition Plan:  Status is: Observation The patient will require care spanning > 2 midnights and should be moved to inpatient because of level of care     Antimicrobials:  Anti-infectives (From admission, onward)    None        Objective: Vitals:   07/07/21 0432 07/07/21 0446 07/07/21 0938 07/07/21 1644  BP: (!) 145/47  (!) 138/51 131/65  Pulse: 80  76 79  Resp: 20  18 18   Temp: 98.7 F (37.1 C)  98.2 F (36.8 C)   TempSrc: Oral  Oral   SpO2: 98%  98% 97%  Weight:  (!) 137.6 kg    Height:        Intake/Output Summary (Last 24 hours) at 07/07/2021 1844 Last data filed at 07/07/2021 1700 Gross  per 24 hour  Intake 957 ml  Output 900 ml  Net 57 ml   Filed Weights   07/05/21 0917 07/06/21 0327 07/07/21 0446  Weight: (!) 151 kg (!) 140.2 kg (!) 137.6 kg    Examination:  General: NAD, alert, oriented  Cardiovascular: S1, S2 present Respiratory: CTAB Abdomen: Soft, nontender, nondistended, obese, bowel sounds present Musculoskeletal: No  bilateral pedal edema noted Skin: Normal Psychiatry: Normal mood    Data Reviewed: I have personally reviewed following labs and imaging studies  CBC: Recent Labs  Lab 07/04/21 1323 07/04/21 1853 07/05/21 0449 07/06/21 0737 07/07/21 0404  WBC 13.6*  --  19.1* 12.2* 13.2*  NEUTROABS 9.6*  --   --   --  8.5*  HGB 10.4* 10.5* 10.6* 9.7* 9.8*  HCT 34.5* 31.0* 35.2* 31.1* 31.6*  MCV 95.8  --  95.4 93.4 94.6  PLT 290  --  274 249 967   Basic Metabolic Panel: Recent Labs  Lab 07/04/21 1323 07/04/21 1853 07/05/21 0449 07/05/21 1235 07/05/21 1736 07/05/21 2118 07/06/21 0335 07/06/21 0737 07/07/21 0404  NA 140 140 138  --   --   --   --  139 140  K 5.1 5.9* 5.8*   < > 5.6* 5.1 5.1 5.0 5.0  CL 108  --  111  --   --   --   --  112* 111  CO2 23  --  18*  --   --   --   --  19* 18*  GLUCOSE 98  --  148*  --   --   --   --  131* 123*  BUN 57*  --  60*  --   --   --   --  59* 56*  CREATININE 1.68*  --  1.69*  --   --   --   --  1.70* 1.71*  CALCIUM 9.1  --  8.9  --   --   --   --  8.8* 8.9  MG 1.7  --   --   --   --   --   --   --   --    < > = values in this interval not displayed.   GFR: Estimated Creatinine Clearance: 35.6 mL/min (A) (by C-G formula based on SCr of 1.71 mg/dL (H)). Liver Function Tests: Recent Labs  Lab 07/04/21 1323  AST 15  ALT 15  ALKPHOS 94  BILITOT 0.6  PROT 7.7  ALBUMIN 3.0*   No results for input(s): LIPASE, AMYLASE in the last 168 hours. No results for input(s): AMMONIA in the last 168 hours. Coagulation Profile: No results for input(s): INR, PROTIME in the last 168 hours. Cardiac Enzymes: Recent Labs  Lab 07/04/21 2000  CKTOTAL 48   BNP (last 3 results) No results for input(s): PROBNP in the last 8760 hours. HbA1C: Recent Labs    07/04/21 2005  HGBA1C 7.0*   CBG: Recent Labs  Lab 07/06/21 1652 07/06/21 2052 07/07/21 0727 07/07/21 1137 07/07/21 1614  GLUCAP 163* 134* 113* 175* 175*   Lipid Profile: No results for  input(s): CHOL, HDL, LDLCALC, TRIG, CHOLHDL, LDLDIRECT in the last 72 hours. Thyroid Function Tests: Recent Labs    07/05/21 0449  TSH 1.194   Anemia Panel: Recent Labs    07/07/21 0404  VITAMINB12 375  FOLATE 4.3*  FERRITIN 311*  TIBC 203*  IRON 54   Sepsis Labs: Recent Labs  Lab 07/07/21 0404  PROCALCITON <0.10  Recent Results (from the past 240 hour(s))  Resp Panel by RT-PCR (Flu A&B, Covid) Nasopharyngeal Swab     Status: None   Collection Time: 07/04/21  1:21 PM   Specimen: Nasopharyngeal Swab; Nasopharyngeal(NP) swabs in vial transport medium  Result Value Ref Range Status   SARS Coronavirus 2 by RT PCR NEGATIVE NEGATIVE Final    Comment: (NOTE) SARS-CoV-2 target nucleic acids are NOT DETECTED.  The SARS-CoV-2 RNA is generally detectable in upper respiratory specimens during the acute phase of infection. The lowest concentration of SARS-CoV-2 viral copies this assay can detect is 138 copies/mL. A negative result does not preclude SARS-Cov-2 infection and should not be used as the sole basis for treatment or other patient management decisions. A negative result may occur with  improper specimen collection/handling, submission of specimen other than nasopharyngeal swab, presence of viral mutation(s) within the areas targeted by this assay, and inadequate number of viral copies(<138 copies/mL). A negative result must be combined with clinical observations, patient history, and epidemiological information. The expected result is Negative.  Fact Sheet for Patients:  EntrepreneurPulse.com.au  Fact Sheet for Healthcare Providers:  IncredibleEmployment.be  This test is no t yet approved or cleared by the [place] FDA and  has been authorized for detection and/or diagnosis of SARS-CoV-2 by FDA under an Emergency Use Authorization (EUA). This EUA will remain  in effect (meaning this test can be used) for the duration of  the COVID-19 declaration under Section 564(b)(1) of the Act, 21 U.S.C.section 360bbb-3(b)(1), unless the authorization is terminated  or revoked sooner.       Influenza A by PCR NEGATIVE NEGATIVE Final   Influenza B by PCR NEGATIVE NEGATIVE Final    Comment: (NOTE) The Xpert Xpress SARS-CoV-2/FLU/RSV plus assay is intended as an aid in the diagnosis of influenza from Nasopharyngeal swab specimens and should not be used as a sole basis for treatment. Nasal washings and aspirates are unacceptable for Xpert Xpress SARS-CoV-2/FLU/RSV testing.  Fact Sheet for Patients: EntrepreneurPulse.com.au  Fact Sheet for Healthcare Providers: IncredibleEmployment.be  This test is not yet approved or cleared by the [place] FDA and has been authorized for detection and/or diagnosis of SARS-CoV-2 by FDA under an Emergency Use Authorization (EUA). This EUA will remain in effect (meaning this test can be used) for the duration of the COVID-19 declaration under Section 564(b)(1) of the Act, 21 U.S.C. section 360bbb-3(b)(1), unless the authorization is terminated or revoked.  Performed at [facility], [place] [address] [place], [place] [address]       Radiology Studies: No results found.    Scheduled Meds:  anastrozole  1 mg Oral Daily   aspirin EC  81 mg Oral Daily   atorvastatin  80 mg Oral Daily   carvedilol  12.5 mg Oral BID WC   clopidogrel  75 mg Oral Daily   diclofenac Sodium  2 g Topical QID   DULoxetine  60 mg Oral Daily   enoxaparin (LOVENOX) injection  0.5 mg/kg Subcutaneous Q24H   febuxostat  40 mg Oral Daily   fluticasone  1 spray Each Nare Daily   fluticasone furoate-vilanterol  1 puff Inhalation Daily   folic acid  1 mg Oral Daily   insulin aspart  0-20 Units Subcutaneous TID WC   insulin aspart  0-5 Units Subcutaneous QHS   insulin glargine-yfgn  20 Units Subcutaneous Daily   iron polysaccharides  150 mg Oral Daily    isosorbide mononitrate  60 mg Oral Daily  linaclotide  145 mcg Oral QAC breakfast   loratadine  10 mg Oral Daily   memantine  5 mg Oral Daily   pantoprazole  20 mg Oral Daily   pregabalin  25 mg Oral BID   sodium chloride flush  3 mL Intravenous Q12H   topiramate  25 mg Oral Daily   vitamin B-12  500 mcg Oral Daily   Continuous Infusions:   LOS: 0 days     Alma Friendly, MD [facility] 07/07/2021, 6:44 PM   Available via Epic secure chat 7am-7pm After these hours, please refer to coverage provider listed on [email or web address]

## 2021-07-07 NOTE — Progress Notes (Signed)
Physical Therapy Treatment ?Patient Details ?Name: [name] ?MRN: [id number] ?DOB: 05/09/1934 ?Today's Date: 07/07/2021 ? ? ?History of Present Illness Pt is an 87 y/o female admitted secondary to syncope. PMH includes CKD, HTN, CAD, COPD, breast cancer, and PE. ? ?  ?PT Comments  ? ? Patient progressing towards physical therapy goals. Patient able to simulate transfer to [facility] as she would at home with increased time/effort and min guard+2 for safety. Patient does require minA for bed mobility. Daughter and sister will be present at discharge but does not sound like they are going to be able to physically assist patient if she needs it. D/c plan remains appropriate at this time.  ?  ?Recommendations for follow up therapy are one component of a multi-disciplinary discharge planning process, led by the attending physician.  Recommendations may be updated based on patient status, additional functional criteria and insurance authorization. ? ?Follow Up Recommendations ? Home health PT ?  ?  ?Assistance Recommended at Discharge Frequent or constant Supervision/Assistance  ?Patient can return home with the following A lot of help with walking and/or transfers;A lot of help with bathing/dressing/bathroom;Help with stairs or ramp for entrance;Assist for transportation;Assistance with cooking/housework;Direct supervision/assist for financial management;Direct supervision/assist for medications management ?  ?Equipment Recommendations ? Wheelchair (measurements PT);Wheelchair cushion (measurements PT) (bariatric)  ?  ?Recommendations for Other Services   ? ? ?  ?Precautions / Restrictions Precautions ?Precautions: Fall ?Restrictions ?Weight Bearing Restrictions: No  ?  ? ?Mobility ? Bed Mobility ?Overal bed mobility: Needs Assistance ?Bed Mobility: Supine to Sit ?  ?  ?Supine to sit: Min assist ?  ?  ?General bed mobility comments: minA for trunk elevation to EOB but patient able to reposition hips towards EOB with  increased time ?  ? ?Transfers ?Overall transfer level: Needs assistance ?Equipment used: None ?Transfers: Bed to chair/wheelchair/BSC ?  ?  ?  ?Squat pivot transfers: Min guard, +2 safety/equipment ?  ?  ?General transfer comment: patient does squat pivot to/from [facility] at home on the right. Patient required increased time and effort but able to complete with min guard+2 for safety. No physical assist required ?  ? ?Ambulation/Gait ?  ?  ?  ?  ?  ?  ?  ?  ? ? ?Stairs ?  ?  ?  ?  ?  ? ? ?Wheelchair Mobility ?  ? ?Modified Rankin (Stroke Patients Only) ?  ? ? ?  ?Balance Overall balance assessment: Needs assistance ?Sitting-balance support: No upper extremity supported, Feet supported ?Sitting balance-Leahy Scale: Fair ?  ?  ?  ?  ?  ?  ?  ?  ?  ?  ?  ?  ?  ?  ?  ?  ?  ? ?  ?Cognition Arousal/Alertness: Awake/alert ?Behavior During Therapy: [facility] for tasks assessed/performed ?Overall Cognitive Status: Within Functional Limits for tasks assessed ?  ?  ?  ?  ?  ?  ?  ?  ?  ?  ?  ?  ?  ?  ?  ?  ?General Comments: Fearful of falling ?  ?  ? ?  ?Exercises   ? ?  ?General Comments   ?  ?  ? ?Pertinent Vitals/Pain Pain Assessment ?Pain Assessment: No/denies pain  ? ? ?Home Living   ?  ?  ?  ?  ?  ?  ?  ?  ?  ?   ?  ?Prior Function    ?  ?  ?   ? ?  PT Goals (current goals can now be found in the care plan section) Acute Rehab PT Goals ?Patient Stated Goal: to return home ?PT Goal Formulation: With patient ?Time For Goal Achievement: 07/19/21 ?Potential to Achieve Goals: Fair ?Progress towards PT goals: Progressing toward goals ? ?  ?Frequency ? ? ? Min 3X/week ? ? ? ?  ?PT Plan Current plan remains appropriate  ? ? ?Co-evaluation   ?  ?  ?  ?  ? ?  ?AM-PAC PT "6 Clicks" Mobility   ?Outcome Measure ? Help needed turning from your back to your side while in a flat bed without using bedrails?: A Little ?Help needed moving from lying on your back to sitting on the side of a flat bed without using bedrails?: A Little ?Help needed  moving to and from a bed to a chair (including a wheelchair)?: A Little ?Help needed standing up from a chair using your arms (e.g., wheelchair or bedside chair)?: Total ?Help needed to walk in hospital room?: Total ?Help needed climbing 3-5 steps with a railing? : Total ?6 Click Score: 12 ? ?  ?End of Session Equipment Utilized During Treatment: Gait belt ?Activity Tolerance: Patient tolerated treatment well ?Patient left: in chair;with call bell/phone within reach;with chair alarm set ?Nurse Communication: Mobility status ?PT Visit Diagnosis: Unsteadiness on feet (R26.81);Muscle weakness (generalized) (M62.81);Difficulty in walking, not elsewhere classified (R26.2) ?  ? ? ?Time: 3143-8887 ?PT Time Calculation (min) (ACUTE ONLY): 18 min ? ?Charges:  $Therapeutic Activity: 8-22 mins          ?          ? ?Terrel Manalo A. Gilford Rile, PT, DPT ?Acute Rehabilitation Services ?Pager [phone] ?Office [phone] ? ? ? ?Natnael Biederman A Meer Reindl ?07/07/2021, 12:26 PM ? ?

## 2021-07-08 ENCOUNTER — Encounter (HOSPITAL_COMMUNITY): Payer: Self-pay

## 2021-07-08 ENCOUNTER — Other Ambulatory Visit (HOSPITAL_COMMUNITY): Payer: Self-pay

## 2021-07-08 ENCOUNTER — Encounter: Payer: Self-pay | Admitting: Hematology

## 2021-07-08 DIAGNOSIS — N1832 Chronic kidney disease, stage 3b: Secondary | ICD-10-CM | POA: Diagnosis not present

## 2021-07-08 DIAGNOSIS — I25118 Atherosclerotic heart disease of native coronary artery with other forms of angina pectoris: Secondary | ICD-10-CM | POA: Diagnosis not present

## 2021-07-08 DIAGNOSIS — R55 Syncope and collapse: Secondary | ICD-10-CM | POA: Diagnosis not present

## 2021-07-08 DIAGNOSIS — Z86711 Personal history of pulmonary embolism: Secondary | ICD-10-CM | POA: Diagnosis not present

## 2021-07-08 DIAGNOSIS — K219 Gastro-esophageal reflux disease without esophagitis: Secondary | ICD-10-CM | POA: Diagnosis not present

## 2021-07-08 DIAGNOSIS — J449 Chronic obstructive pulmonary disease, unspecified: Secondary | ICD-10-CM | POA: Diagnosis not present

## 2021-07-08 DIAGNOSIS — I10 Essential (primary) hypertension: Secondary | ICD-10-CM | POA: Diagnosis not present

## 2021-07-08 DIAGNOSIS — C50112 Malignant neoplasm of central portion of left female breast: Secondary | ICD-10-CM | POA: Diagnosis not present

## 2021-07-08 LAB — CBC WITH DIFFERENTIAL/PLATELET
Abs Immature Granulocytes: 0.05 10*3/uL (ref 0.00–0.07)
Basophils Absolute: 0 10*3/uL (ref 0.0–0.1)
Basophils Relative: 0 %
Eosinophils Absolute: 0.3 10*3/uL (ref 0.0–0.5)
Eosinophils Relative: 3 %
HCT: 30.4 % — ABNORMAL LOW (ref 36.0–46.0)
Hemoglobin: 9.2 g/dL — ABNORMAL LOW (ref 12.0–15.0)
Immature Granulocytes: 0 %
Lymphocytes Relative: 31 %
Lymphs Abs: 3.9 10*3/uL (ref 0.7–4.0)
MCH: 28.4 pg (ref 26.0–34.0)
MCHC: 30.3 g/dL (ref 30.0–36.0)
MCV: 93.8 fL (ref 80.0–100.0)
Monocytes Absolute: 0.8 10*3/uL (ref 0.1–1.0)
Monocytes Relative: 6 %
Neutro Abs: 7.7 10*3/uL (ref 1.7–7.7)
Neutrophils Relative %: 60 %
Platelets: 228 10*3/uL (ref 150–400)
RBC: 3.24 MIL/uL — ABNORMAL LOW (ref 3.87–5.11)
RDW: 16.1 % — ABNORMAL HIGH (ref 11.5–15.5)
WBC: 12.8 10*3/uL — ABNORMAL HIGH (ref 4.0–10.5)
nRBC: 0 % (ref 0.0–0.2)

## 2021-07-08 LAB — BASIC METABOLIC PANEL
Anion gap: 6 (ref 5–15)
BUN: 53 mg/dL — ABNORMAL HIGH (ref 8–23)
CO2: 18 mmol/L — ABNORMAL LOW (ref 22–32)
Calcium: 8.9 mg/dL (ref 8.9–10.3)
Chloride: 112 mmol/L — ABNORMAL HIGH (ref 98–111)
Creatinine, Ser: 1.72 mg/dL — ABNORMAL HIGH (ref 0.44–1.00)
GFR, Estimated: 29 mL/min — ABNORMAL LOW (ref >60)
Glucose, Bld: 137 mg/dL — ABNORMAL HIGH (ref 70–99)
Potassium: 5 mmol/L (ref 3.5–5.1)
Sodium: 136 mmol/L (ref 135–145)

## 2021-07-08 LAB — GLUCOSE, CAPILLARY
Glucose-Capillary: 141 mg/dL — ABNORMAL HIGH (ref 70–99)
Glucose-Capillary: 215 mg/dL — ABNORMAL HIGH (ref 70–99)

## 2021-07-08 MED ORDER — CARVEDILOL 25 MG PO TABS
12.5000 mg | ORAL_TABLET | Freq: Two times a day (BID) | ORAL | 0 refills | Status: DC
  Filled 2021-07-08: qty 30, 30d supply, fill #0

## 2021-07-08 MED ORDER — VITAMIN B-12 1000 MCG PO TABS
500.0000 ug | ORAL_TABLET | Freq: Every day | ORAL | 0 refills | Status: AC
  Filled 2021-07-08: qty 15, 30d supply, fill #0

## 2021-07-08 MED ORDER — FOLIC ACID 1 MG PO TABS
1.0000 mg | ORAL_TABLET | Freq: Every day | ORAL | 0 refills | Status: DC
  Filled 2021-07-08: qty 30, 30d supply, fill #0

## 2021-07-08 NOTE — Progress Notes (Signed)
Physical Therapy Treatment ?Patient Details ?Name: [name] ?MRN: [id number] ?DOB: Jun 12, 1934 ?Today's Date: 07/08/2021 ? ? ?History of Present Illness Pt is an 87 y/o female admitted secondary to syncope. PMH includes CKD, HTN, CAD, COPD, breast cancer, and PE. ? ?  ?PT Comments  ? ? Session focused on transfer training and education prior to returning home. Patient simulated her way of transferring to recliner (using in place of [facility]) with chair directly anterior to her and turning complete 180 degrees to sit with min guard +2. Educated patient on safer technique to transfer to/from [facility] at home with [facility] perpendicular to bed, patient verbalized understanding and willing to attempt but due to fatigue, unable to complete. Patient fatigued due to recently transferring to/from [facility] with nurse tech within 30 minutes of therapist arrival. Daughter present and educated also but will not be physically assisting at home.  D/c plan remains appropriate as patient and daughter say they will have family that can physically assist if needed.  ?  ?Recommendations for follow up therapy are one component of a multi-disciplinary discharge planning process, led by the attending physician.  Recommendations may be updated based on patient status, additional functional criteria and insurance authorization. ? ?Follow Up Recommendations ? Home health PT ?  ?  ?Assistance Recommended at Discharge Frequent or constant Supervision/Assistance  ?Patient can return home with the following A lot of help with walking and/or transfers;A lot of help with bathing/dressing/bathroom;Help with stairs or ramp for entrance;Assist for transportation;Assistance with cooking/housework;Direct supervision/assist for financial management;Direct supervision/assist for medications management ?  ?Equipment Recommendations ? Wheelchair (measurements PT);Wheelchair cushion (measurements PT) (bariatric)  ?  ?Recommendations for Other Services   ? ? ?  ?Precautions  / Restrictions Precautions ?Precautions: Fall ?Restrictions ?Weight Bearing Restrictions: No  ?  ? ?Mobility ? Bed Mobility ?Overal bed mobility: Needs Assistance ?Bed Mobility: Supine to Sit ?  ?  ?Supine to sit: Max assist ?  ?  ?General bed mobility comments: HOB flat. Heavy reliance on UE support of therapist with maxA to assist trunk. Patient stating her daughter usually stands in front of her and she pulls on her the same way as she did the therapist. ?  ? ?Transfers ?Overall transfer level: Needs assistance ?Equipment used: None ?Transfers: Bed to chair/wheelchair/BSC ?  ?  ?  ?Squat pivot transfers: Min guard, +2 safety/equipment ?  ?  ?General transfer comment: Patient simulated bed>chair transfer as she would perform at home with chair directly anterior to her. Min guard+2 for safety while she performed squat pivot to the chair. Educated patient on this not being the safest technique due to fall risk and discussed with her on transfer technique with chair perpendicular to bed to reduce amount of steps and chances of falling. Unable to attempt new technique due to fatigue from previously transferring to/from [facility] with nurse tech within 30 minutes of therapist arriving ?  ? ?Ambulation/Gait ?  ?  ?  ?  ?  ?  ?  ?  ? ? ?Stairs ?  ?  ?  ?  ?  ? ? ?Wheelchair Mobility ?  ? ?Modified Rankin (Stroke Patients Only) ?  ? ? ?  ?Balance Overall balance assessment: Needs assistance ?Sitting-balance support: No upper extremity supported, Feet supported ?Sitting balance-Leahy Scale: Fair ?  ?  ?Standing balance support: No upper extremity supported ?Standing balance-Leahy Scale: Poor ?  ?  ?  ?  ?  ?  ?  ?  ?  ?  ?  ?  ?  ? ?  ?  Cognition Arousal/Alertness: Awake/alert ?Behavior During Therapy: [facility] for tasks assessed/performed ?Overall Cognitive Status: Within Functional Limits for tasks assessed ?  ?  ?  ?  ?  ?  ?  ?  ?  ?  ?  ?  ?  ?  ?  ?  ?General Comments: STM deficits as patient unable to recall previous session ?   ?  ? ?  ?Exercises   ? ?  ?General Comments General comments (skin integrity, edema, etc.): Daughter present but not very interactive or supportive on providing assist ?  ?  ? ?Pertinent Vitals/Pain Pain Assessment ?Pain Assessment: Faces ?Faces Pain Scale: No hurt ?Pain Intervention(s): Monitored during session  ? ? ?Home Living   ?  ?  ?  ?  ?  ?  ?  ?  ?  ?   ?  ?Prior Function    ?  ?  ?   ? ?PT Goals (current goals can now be found in the care plan section) Acute Rehab PT Goals ?Patient Stated Goal: to return home ?PT Goal Formulation: With patient ?Time For Goal Achievement: 07/19/21 ?Potential to Achieve Goals: Fair ?Progress towards PT goals: Progressing toward goals ? ?  ?Frequency ? ? ? Min 3X/week ? ? ? ?  ?PT Plan Current plan remains appropriate  ? ? ?Co-evaluation PT/OT/SLP Co-Evaluation/Treatment: Yes ?Reason for Co-Treatment: For patient/therapist safety;To address functional/ADL transfers ?PT goals addressed during session: Balance;Mobility/safety with mobility ?  ?  ? ?  ?AM-PAC PT "6 Clicks" Mobility   ?Outcome Measure ? Help needed turning from your back to your side while in a flat bed without using bedrails?: A Little ?Help needed moving from lying on your back to sitting on the side of a flat bed without using bedrails?: A Lot ?Help needed moving to and from a bed to a chair (including a wheelchair)?: A Little ?Help needed standing up from a chair using your arms (e.g., wheelchair or bedside chair)?: Total ?Help needed to walk in hospital room?: Total ?Help needed climbing 3-5 steps with a railing? : Total ?6 Click Score: 11 ? ?  ?End of Session   ?Activity Tolerance: Patient tolerated treatment well ?Patient left: in chair;with call bell/phone within reach;with chair alarm set;with family/visitor present ?Nurse Communication: Mobility status ?PT Visit Diagnosis: Unsteadiness on feet (R26.81);Muscle weakness (generalized) (M62.81);Difficulty in walking, not elsewhere classified (R26.2) ?   ? ? ?Time: 1131-1202 ?PT Time Calculation (min) (ACUTE ONLY): 31 min ? ?Charges:  $Therapeutic Activity: 8-22 mins          ?          ? ?Hilary Pundt A. Gilford Rile, PT, DPT ?Acute Rehabilitation Services ?Pager [phone] ?Office [phone] ? ? ? ?Jersee Winiarski A Raphel Stickles ?07/08/2021, 1:01 PM ? ?

## 2021-07-08 NOTE — TOC Transition Note (Signed)
Transition of Care (TOC) - CM/SW Discharge Note ? ? ?Patient Details  ?Name: [name] ?MRN: [id number] ?Date of Birth: 22-Mar-1935 ? ?Transition of Care (TOC) CM/SW Contact:  ?Tom-Johnson, Renea Ee, RN ?Phone Number: ?07/08/2021, 1:38 PM ? ? ?Clinical Narrative:    ? ?Patient is scheduled for discharge today. Home health PT/OT/Aide with [facility] and Bariatric wheelchair delivered to bedside by [employer]. Denies any other needs. Daughter to transport at discharge. No further TOC needs noted. ? ?Final next level of care: [place] ?Barriers to Discharge: Continued Medical Work up ? ? ?Patient Goals and CMS Choice ?Patient states their goals for this hospitalization and ongoing recovery are:: To return home ?CMS Medicare.gov Compare Post Acute Care list provided to:: Patient ?Choice offered to / list presented to : Patient ? ?Discharge Placement ?  ?           ?  ?Patient to be transferred to facility by: Daughter ?  ?  ? ?Discharge Plan and Services ?  ?Discharge Planning Services: CM Consult ?Post Acute Care Choice: Home Health          ?DME Arranged: Wheelchair manual (Bariatric) ?DME Agency: [employer] ?Date DME Agency Contacted: 07/06/21 ?Time DME Agency Contacted: 1310 ?Representative spoke with at DME Agency: Freda Munro ?HH Arranged: PT, OT ?[place] Agency: [place] ?  ?  ?  ? ?Social Determinants of Health (SDOH) Interventions ?  ? ? ?Readmission Risk Interventions ?Readmission Risk Prevention Plan 02/18/2020 01/27/2019  ?Transportation Screening Complete Complete  ?Medication Review Press photographer) Complete Complete  ?PCP or Specialist appointment within 3-5 days of discharge Complete Complete  ?[place] or Home Care Consult Complete Complete  ?SW Recovery Care/Counseling Consult Complete Complete  ?Palliative Care Screening Not Applicable Not Applicable  ?[place] Not Applicable Not Applicable  ?Some recent data might be hidden  ? ? ? ? ? ?

## 2021-07-08 NOTE — Progress Notes (Signed)
Nursing dc note ? ?All belongings given to patients family upon dc. Nt nicole to take patient to the main entrance to meet ride. Patient has no complaints.  ?

## 2021-07-08 NOTE — Discharge Summary (Signed)
Physician Discharge Summary   Patient: [name] MRN: [id number] DOB: 1935/03/10  Admit date:     07/04/2021  Discharge date: 07/08/21  Discharge Physician: Alma Friendly   PCP: [name], NP   Recommendations at discharge:   Follow-up with PCP in 1 week Follow-up with cardiology as scheduled   Discharge Diagnoses: Principal Problem:   Syncope Active Problems:   Essential hypertension   Coronary artery disease of native artery of native heart with stable angina pectoris (HCC)   GERD (gastroesophageal reflux disease)   COPD (chronic obstructive pulmonary disease) (HCC)   History of pulmonary embolus (PE)   CKD (chronic kidney disease) stage 3, GFR 30-59 ml/min (HCC)   Dyslipidemia associated with type 2 diabetes mellitus (HCC)   Breast cancer (HCC)   Hyperkalemia   Syncope, vasovagal     Hospital Course: [name] is a 87 y.o. female with medical history significant of IDDM, COPD, CAD with stenting, chronic HFpEF, history of PE off anticoagulation due to worsening of iron deficiency anemia in 2021, COPD, breast CA on hormone therapy CKD stage IIIb, morbid obesity, multiple OA, gout, chronic ambulation dysfunction, diabetic neuropathy, chronic dizziness on meclizine, presented with syncope.  Patient admitted for further management.    Today, patient reports feeling much better, denies any worsening dizziness, denies any chest pain, shortness of breath, abdominal pain, nausea/vomiting, fever/chills.  Bilateral lower extremity pain is improved with home dose of oxycodone and pregabalin.  Discussed extensively with daughter at bedside about discharge plans.  Follow-up with PCP in 1 week, and cardiology as scheduled.  Patient will be discharged on home health PT/OT, aide.   Assessment and Plan: No notes have been filed under this hospital service. Service: Hospitalist   Syncope No further episodes noted Possibly 2/2 antihypertensive medications with  underlying deconditioning Echocardiogram limited due to body habitus and suboptimal imaging.  Aortic valve was not well visualized, EF 55-60%, grade 1DD, no RWMA VQ scan negative Unable to check orthostatic vital sign due to bedbound status and significant deconditioning PT/OT   Hypertension BP currently stable with recent adjustments Meds were recently adjusted by cardiology in 05/2021, unsure if patient's was aware Cardiology recommended to switch to Coreg 25 mg twice daily, continue Imdur, Lasix as needed and discontinue metoprolol and hydralazine Currently on Coreg 12.5 twice daily, may increase to 25 mg if BP is uncontrolled, continue Imdur Follow-up with PCP/cardiology   Leukocytosis Noted to be chronic, remained afebrile UA with trace leuko, many bacteria, WBC 0-5 CXR with ?possible interstitial PNA, asymptomatic, no cough/fever/shortness of breath Procalcitonin <0.10, negative Follow-up with PCP   CKD stage IIIb Baseline Cr around 1.6   Anemia of CKD Folic acid deficiency Anemia panel showed iron 54, folate 4.3, vitamin B12 413 Started on folic acid, adjusted PTA vitamin B12  History of PE Patient has been off Xarelto since 2021 due to worsening of iron deficiency anemia   CAD Continue aspirin and Plavix, lipitor   COPD Stable Continue home inhalers, nebulizer as needed  Diabetes mellitus type 2 Hemoglobin a1c 7.0 Continue home insulin regimen   Gout Continue Uloric   History of breast cancer Follow-up with oncology  Dementia, mild Continue memantine   Morbid obesity Lifestyle modification advised   Pain control - [place] Controlled Substance Reporting System database was reviewed. and patient was instructed, not to drive, operate heavy machinery, perform activities at heights, swimming or participation in water activities or provide baby-sitting services while on Pain, Sleep and  Anxiety Medications; until their outpatient Physician has advised to  do so again. Also recommended to not to take more than prescribed Pain, Sleep and Anxiety Medications.   Consultants: None Procedures performed: None Disposition: Home health Diet recommendation:  Cardiac and Carb modified diet  DISCHARGE MEDICATION: Allergies as of 07/08/2021       Reactions   Sulfonamide Derivatives Swelling   Mouth swelling   Aricept [donepezil Hcl] Diarrhea, Nausea And Vomiting   GI upset/loose stools   Tramadol Nausea And Vomiting        Medication List     STOP taking these medications    hydrALAZINE 50 MG tablet Commonly known as: APRESOLINE   insulin lispro 100 UNIT/ML KwikPen Commonly known as: HumaLOG KwikPen   metoprolol succinate 50 MG 24 hr tablet Commonly known as: TOPROL-XL       TAKE these medications    albuterol 108 (90 Base) MCG/ACT inhaler Commonly known as: ProAir HFA Inhale 1-2 puffs into the lungs every 6 (six) hours as needed for wheezing or shortness of breath.   albuterol (2.5 MG/3ML) 0.083% nebulizer solution Commonly known as: PROVENTIL Take 3 mLs (2.5 mg total) by nebulization every 6 (six) hours as needed for wheezing or shortness of breath.   anastrozole 1 MG tablet Commonly known as: ARIMIDEX TAKE 1 TABLET BY MOUTH EVERY DAY   Aspirin Low Dose 81 MG EC tablet Generic drug: aspirin TAKE 1 TABLET BY MOUTH EVERY DAY   atorvastatin 80 MG tablet Commonly known as: LIPITOR TAKE 1 TABLET BY MOUTH EVERY DAY   budesonide-formoterol 160-4.5 MCG/ACT inhaler Commonly known as: SYMBICORT Inhale 2 puffs into the lungs 2 (two) times daily.   carvedilol 25 MG tablet Commonly known as: COREG Take 0.5 tablets (12.5 mg total) by mouth 2 (two) times daily. What changed: how much to take   cetirizine 10 MG tablet Commonly known as: ZYRTEC TAKE 1 TABLET BY MOUTH EVERY DAY   clopidogrel 75 MG tablet Commonly known as: PLAVIX Take 1 tablet (75 mg total) by mouth daily.   diclofenac Sodium 1 % Gel Commonly known as:  Voltaren Apply 2 g topically 4 (four) times daily.   DULoxetine 60 MG capsule Commonly known as: CYMBALTA Take one capsule by mouth once daily for anxiety.   febuxostat 40 MG tablet Commonly known as: ULORIC Take 1 tablet (40 mg total) by mouth daily.   fluticasone 50 MCG/ACT nasal spray Commonly known as: FLONASE Place 1 spray into both nostrils in the morning and at bedtime.   folic acid 1 MG tablet Commonly known as: FOLVITE Take 1 tablet (1 mg total) by mouth daily. Start taking on: July 09, 2021   FreeStyle Libre 14 Day Sensor Misc USE TO INJECT 1 DEVICE INTO THE SKIN AS DIRECTED   furosemide 20 MG tablet Commonly known as: LASIX Take 1 tablet (20 mg total) by mouth daily as needed. What changed: when to take this   guaiFENesin 600 MG 12 hr tablet Commonly known as: Mucinex Take 1 tablet (600 mg total) by mouth 2 (two) times daily as needed.   iron polysaccharides 150 MG capsule Commonly known as: NIFEREX Take 1 capsule (150 mg total) by mouth daily.   isosorbide mononitrate 60 MG 24 hr tablet Commonly known as: IMDUR Take 1 tablet (60 mg total) by mouth daily.   linaclotide 145 MCG Caps capsule Commonly known as: LINZESS Take 1 capsule (145 mcg total) by mouth daily before breakfast.   meclizine 25 MG tablet Commonly  known as: ANTIVERT TAKE 1 TABLET BY MOUTH THREE TIMES DAILY AS NEEDED FOR DIZZINESS   memantine 5 MG tablet Commonly known as: NAMENDA Take 2 by mouth in the am and 1 by mouth in the pm   nitroGLYCERIN 0.4 MG SL tablet Commonly known as: Nitrostat Take one tablet under the tongue every 5 minutes as needed for chest pain   NOVOLOG FLEXPEN RELION [place] Inject 16 Units into the skin daily.   nystatin cream Commonly known as: MYCOSTATIN Apply 1 application topically 2 (two) times daily.   oxyCODONE 5 MG immediate release tablet Commonly known as: Oxy IR/ROXICODONE Take 1 tablet (5 mg total) by mouth every 12 (twelve) hours as needed for  severe pain. Must last 30 days.   pantoprazole 20 MG tablet Commonly known as: PROTONIX Take 1 tablet (20 mg total) by mouth daily.   polyethylene glycol powder 17 GM/SCOOP powder Commonly known as: MiraLax 17 gm mix with liquid twice daily   pregabalin 25 MG capsule Commonly known as: LYRICA Take 25 mg by mouth 2 (two) times daily.   sodium polystyrene 15 GM/60ML suspension Commonly known as: SPS TAKE 10mls BY MOUTH ONCE A WEEK TO keep potassium down   topiramate 25 MG tablet Commonly known as: TOPAMAX Take 1 tablet (25 mg total) by mouth daily.   Toujeo Max SoloStar 300 UNIT/ML Solostar Pen Generic drug: insulin glargine (2 Unit Dial) Inject 42 Units into the skin daily. Patient assistance program provides What changed: how much to take   vitamin B-12 500 MCG tablet Commonly known as: CYANOCOBALAMIN Take 1 tablet (500 mcg total) by mouth daily. Start taking on: July 09, 2021 What changed:  medication strength how much to take when to take this   Vitamin D (Ergocalciferol) 1.25 MG (50000 UNIT) Caps capsule Commonly known as: DRISDOL TAKE 1 CAPSULE BY MOUTH ONCE WEEKLY               Durable Medical Equipment  (From admission, onward)           Start     Ordered   07/06/21 1531  For home use only DME lightweight manual wheelchair with seat cushion  Once       Comments: Patient suffers from Weakness, Syncope which impairs their ability to perform daily activities like bathing and toileting in the home.  A walker will not resolve issue with performing activities of daily living. A Bariatric wheelchair will allow patient to safely perform daily activities. Patient is not able to propel themselves in the home using a standard weight wheelchair due to general weakness. Patient can self propel in the lightweight wheelchair. Length of need 12 months . Accessories: elevating leg rests (ELRs), wheel locks, extensions and anti-tippers.   07/06/21 1534             Follow-up Information     [facility] Follow up.   Specialty: Home Health Services Why: Someone will call you to schedule first home visit. Contact information: [address] [place] [address] [phone]         [name], NP. Schedule an appointment as soon as possible for a visit in 1 week(s).   Specialty: Geriatric Medicine Contact information: [address] [place] [address] [phone]         [name], MD .   Specialty: Cardiology Contact information: [address] [place] [address] [phone]  Discharge Exam: Filed Weights   07/06/21 0327 07/07/21 0446 07/08/21 0529  Weight: (!) 140.2 kg (!) 137.6 kg (!) 141.1 kg   General: NAD, morbidly obese Cardiovascular: S1, S2 present Respiratory: CTAB Abdomen: Soft, nontender, nondistended, bowel sounds present Musculoskeletal: No bilateral pedal edema noted Skin: Normal Psychiatry: Normal mood     Condition at discharge: stable    The results of significant diagnostics from this hospitalization (including imaging, microbiology, ancillary and laboratory) are listed below for reference.   Imaging Studies: DG Shoulder 1V Left  Result Date: 07/04/2021 CLINICAL DATA:  Shoulder pain. EXAM: LEFT SHOULDER COMPARISON:  None. FINDINGS: There is no evidence of acute fracture or dislocation. Examination is limited due to single view. Moderate to severe degenerative changes are noted at the glenohumeral and acromioclavicular joints. Soft tissues are unremarkable. IMPRESSION: Moderate to severe degenerative changes at the glenohumeral and acromioclavicular joints. Electronically Signed   By: Brett Fairy M.D.   On: 07/04/2021 21:31   CT HEAD WO CONTRAST  Result Date: 07/04/2021 CLINICAL DATA:  Syncope. EXAM: CT HEAD WITHOUT CONTRAST TECHNIQUE: Contiguous axial images were obtained from the base of the skull through the  vertex without intravenous contrast. RADIATION DOSE REDUCTION: This exam was performed according to the departmental dose-optimization program which includes automated exposure control, adjustment of the mA and/or kV according to patient size and/or use of iterative reconstruction technique. COMPARISON:  CT 08/27/2018 FINDINGS: Brain: No acute intracranial hemorrhage. No focal mass lesion. No CT evidence of acute infarction. No midline shift or mass effect. No hydrocephalus. Basilar cisterns are patent. There are periventricular and subcortical white matter hypodensities. Generalized cortical atrophy. Vascular: No hyperdense vessel or unexpected calcification. Skull: Normal. Negative for fracture or focal lesion. Sinuses/Orbits: Paranasal sinuses and mastoid air cells are clear. Orbits are clear. Other: None. IMPRESSION: 1. No acute intracranial findings. 2. Extensive white matter microvascular disease. 3. Mild atrophy Electronically Signed   By: Suzy Bouchard M.D.   On: 07/04/2021 15:04   CT Chest Wo Contrast  Result Date: 07/04/2021 CLINICAL DATA:  Syncope EXAM: CT CHEST WITHOUT CONTRAST TECHNIQUE: Multidetector CT imaging of the chest was performed following the standard protocol without IV contrast. RADIATION DOSE REDUCTION: This exam was performed according to the departmental dose-optimization program which includes automated exposure control, adjustment of the mA and/or kV according to patient size and/or use of iterative reconstruction technique. COMPARISON:  07/04/2021, 06/14/2015 FINDINGS: Cardiovascular: Limited unenhanced imaging of the heart and great vessels demonstrates no pericardial effusion. There is extensive atherosclerosis throughout the coronary vasculature. Normal caliber of the thoracic aorta, with mild atherosclerosis of the aortic arch. Evaluation of the vascular lumen is limited without IV contrast. Mediastinum/Nodes: No enlarged mediastinal or axillary lymph nodes. Thyroid gland,  trachea, and esophagus demonstrate no significant findings. Stable postsurgical changes at the gastroesophageal junction, with small hiatal hernia. Lungs/Pleura: No acute airspace disease, effusion, or pneumothorax. Central airways are patent. Upper Abdomen: No acute abnormality. Musculoskeletal: No acute or destructive bony lesions. Reconstructed images demonstrate no additional findings. IMPRESSION: 1. No acute intrathoracic process. 2. Aortic Atherosclerosis (ICD10-I70.0). Coronary artery atherosclerosis. Electronically Signed   By: Randa Ngo M.D.   On: 07/04/2021 15:03   NM Pulmonary Perf and Vent  Result Date: 07/05/2021 CLINICAL DATA:  Positive D-dimer, pulmonary embolism suspected EXAM: NUCLEAR MEDICINE PERFUSION LUNG SCAN TECHNIQUE: Perfusion images were obtained in multiple projections after intravenous injection of radiopharmaceutical. Ventilation scans intentionally deferred if perfusion scan and chest x-ray adequate for interpretation during COVID 19 epidemic. RADIOPHARMACEUTICALS:  4.1 mCi Tc-18m MAA IV COMPARISON:  03/03/2020 FINDINGS: There are no discrete wedge-shaped perfusion defects. No significant interval changes are noted. IMPRESSION: There are no wedge-shaped perfusion defects. There are no imaging signs of pulmonary embolism. Electronically Signed   By: Elmer Picker M.D.   On: 07/05/2021 13:09   DG Chest Port 1 View  Result Date: 07/04/2021 CLINICAL DATA:  Syncope EXAM: PORTABLE CHEST 1 VIEW COMPARISON:  03/03/2020 FINDINGS: Apparent shift of mediastinum to the right may be due to rotation. Transverse diameter of heart is increased. Central pulmonary vessels are more prominent. There is poor inspiration. Interstitial markings in the parahilar regions and lower lung fields are prominent. Costophrenic angles are clear. There is no pneumothorax. IMPRESSION: Central pulmonary vessels are more prominent. There is poor inspiration. Increased interstitial markings are seen in the  parahilar regions and lower lung fields suggesting interstitial edema or interstitial pneumonia. There is no focal consolidation in the peripheral lung fields. Electronically Signed   By: Elmer Picker M.D.   On: 07/04/2021 13:16   MM 3D SCREEN BREAST BILATERAL  Result Date: 06/21/2021 CLINICAL DATA:  Screening. EXAM: DIGITAL SCREENING BILATERAL MAMMOGRAM WITH TOMOSYNTHESIS AND CAD TECHNIQUE: Bilateral screening digital craniocaudal and mediolateral oblique mammograms were obtained. Bilateral screening digital breast tomosynthesis was performed. The images were evaluated with computer-aided detection. COMPARISON:  Previous exam(s). ACR Breast Density Category b: There are scattered areas of fibroglandular density. FINDINGS: There are no findings suspicious for malignancy. IMPRESSION: No mammographic evidence of malignancy. A result letter of this screening mammogram will be mailed directly to the patient. RECOMMENDATION: Screening mammogram in one year. (Code:[id number]-B-01Y) BI-RADS CATEGORY  1: Negative. Electronically Signed   By: Lovey Newcomer M.D.   On: 06/21/2021 12:57   ECHOCARDIOGRAM COMPLETE  Result Date: 07/05/2021    ECHOCARDIOGRAM REPORT   Patient Name:   [name] Date of Exam: 07/05/2021 Medical Rec #:  [id number]       Height:       67.0 in Accession #:    [id number]      Weight:       313.0 lb Date of Birth:  25-Feb-1935       BSA:          2.446 m Patient Age:    42 years        BP:           125/55 mmHg Patient Gender: F               HR:           91 bpm. Exam Location:  Inpatient Procedure: 2D Echo, Limited Echo, Cardiac Doppler and Color Doppler Indications:    R55 SYNCOPE  History:        Patient has prior history of Echocardiogram examinations, most                 recent 02/15/2020. CAD and Previous Myocardial Infarction; Risk                 Factors:Hypertension and Dyslipidemia.  Sonographer:    Beryle Beams Referring Phys: [id number] PING T ZHANG  Sonographer Comments: Suboptimal  parasternal window, suboptimal apical window, no subcostal window and patient is morbidly obese. Image acquisition challenging due to patient body habitus and Image acquisition challenging due to respiratory motion. PATIENT UNABLEO ROLL ON SIDE DUE TO BODY HABITUS IMPRESSIONS  1. Left ventricular ejection fraction, by estimation, is 55 to 60%. The left ventricle has normal function. Left ventricular  endocardial border not optimally defined to evaluate regional wall motion- there are no wall motion abnormalities seen in views obtained. Left ventricular diastolic parameters are consistent with Grade I diastolic dysfunction (impaired relaxation).  2. Right ventricular systolic function is normal. The right ventricular size is normal.  3. The mitral valve was not well visualized. No evidence of mitral valve regurgitation. No evidence of mitral stenosis.  4. The aortic valve was not well visualized. Aortic valve regurgitation is not visualized. Comparison(s): Difficult comparison- this is a technically difficult study. FINDINGS  Left Ventricle: Left ventricular ejection fraction, by estimation, is 55 to 60%. The left ventricle has normal function. Left ventricular endocardial border not optimally defined to evaluate regional wall motion. The left ventricular internal cavity size was normal in size. There is no left ventricular hypertrophy. Left ventricular diastolic parameters are consistent with Grade I diastolic dysfunction (impaired relaxation). Right Ventricle: The right ventricular size is normal. Right vetricular wall thickness was not well visualized. Right ventricular systolic function is normal. Left Atrium: Left atrial size was normal in size. Right Atrium: Right atrial size was normal in size. Pericardium: There is no evidence of pericardial effusion. Presence of epicardial fat layer. Mitral Valve: The mitral valve was not well visualized. No evidence of mitral valve regurgitation. No evidence of mitral valve  stenosis. Tricuspid Valve: The tricuspid valve is not well visualized. Tricuspid valve regurgitation is not demonstrated. No evidence of tricuspid stenosis. Aortic Valve: The aortic valve was not well visualized. Aortic valve regurgitation is not visualized. Pulmonic Valve: The pulmonic valve was not well visualized. Pulmonic valve regurgitation is not visualized. Aorta: The ascending aorta was not well visualized. IAS/Shunts: The interatrial septum was not well visualized.   LV Volumes (MOD) LV vol d, MOD A2C: 36.1 ml LV vol d, MOD A4C: 54.7 ml LV vol s, MOD A2C: 15.2 ml LV vol s, MOD A4C: 27.0 ml LV SV MOD A2C:     20.9 ml LV SV MOD A4C:     54.7 ml LV SV MOD BP:      24.5 ml LEFT ATRIUM             Index LA Vol (A2C):   37.9 ml 15.49 ml/m LA Vol (A4C):   23.6 ml 9.65 ml/m LA Biplane Vol: 31.3 ml 12.80 ml/m  PULMONIC VALVE PV Vmax:       0.82 m/s PV Vmean:      57.000 cm/s PV VTI:        0.204 m PV Peak grad:  2.7 mmHg PV Mean grad:  2.0 mmHg  MITRAL VALVE MV Area (PHT): 2.79 cm MV Decel Time: 272 msec MV E velocity: 85.70 cm/s MV A velocity: 144.00 cm/s MV E/A ratio:  0.60 Rudean Haskell MD Electronically signed by Rudean Haskell MD Signature Date/Time: 07/05/2021/10:09:36 AM    Final     Microbiology: Results for orders placed or performed during the hospital encounter of 07/04/21  Resp Panel by RT-PCR (Flu A&B, Covid) Nasopharyngeal Swab     Status: None   Collection Time: 07/04/21  1:21 PM   Specimen: Nasopharyngeal Swab; Nasopharyngeal(NP) swabs in vial transport medium  Result Value Ref Range Status   SARS Coronavirus 2 by RT PCR NEGATIVE NEGATIVE Final    Comment: (NOTE) SARS-CoV-2 target nucleic acids are NOT DETECTED.  The SARS-CoV-2 RNA is generally detectable in upper respiratory specimens during the acute phase of infection. The lowest concentration of SARS-CoV-2 viral copies this assay can detect is 138 copies/mL. A  negative result does not preclude SARS-Cov-2 infection  and should not be used as the sole basis for treatment or other patient management decisions. A negative result may occur with  improper specimen collection/handling, submission of specimen other than nasopharyngeal swab, presence of viral mutation(s) within the areas targeted by this assay, and inadequate number of viral copies(<138 copies/mL). A negative result must be combined with clinical observations, patient history, and epidemiological information. The expected result is Negative.  Fact Sheet for Patients:  EntrepreneurPulse.com.au  Fact Sheet for Healthcare Providers:  IncredibleEmployment.be  This test is no t yet approved or cleared by the [place] FDA and  has been authorized for detection and/or diagnosis of SARS-CoV-2 by FDA under an Emergency Use Authorization (EUA). This EUA will remain  in effect (meaning this test can be used) for the duration of the COVID-19 declaration under Section 564(b)(1) of the Act, 21 U.S.C.section 360bbb-3(b)(1), unless the authorization is terminated  or revoked sooner.       Influenza A by PCR NEGATIVE NEGATIVE Final   Influenza B by PCR NEGATIVE NEGATIVE Final    Comment: (NOTE) The Xpert Xpress SARS-CoV-2/FLU/RSV plus assay is intended as an aid in the diagnosis of influenza from Nasopharyngeal swab specimens and should not be used as a sole basis for treatment. Nasal washings and aspirates are unacceptable for Xpert Xpress SARS-CoV-2/FLU/RSV testing.  Fact Sheet for Patients: EntrepreneurPulse.com.au  Fact Sheet for Healthcare Providers: IncredibleEmployment.be  This test is not yet approved or cleared by the [place] FDA and has been authorized for detection and/or diagnosis of SARS-CoV-2 by FDA under an Emergency Use Authorization (EUA). This EUA will remain in effect (meaning this test can be used) for the duration of the COVID-19 declaration  under Section 564(b)(1) of the Act, 21 U.S.C. section 360bbb-3(b)(1), unless the authorization is terminated or revoked.  Performed at [facility], [place] [address] [place], [place] [address]    *Note: Due to a large number of results and/or encounters for the requested time period, some results have not been displayed. A complete set of results can be found in Results Review.    Labs: CBC: Recent Labs  Lab 07/04/21 1323 07/04/21 1853 07/05/21 0449 07/06/21 0737 07/07/21 0404 07/08/21 0456  WBC 13.6*  --  19.1* 12.2* 13.2* 12.8*  NEUTROABS 9.6*  --   --   --  8.5* 7.7  HGB 10.4* 10.5* 10.6* 9.7* 9.8* 9.2*  HCT 34.5* 31.0* 35.2* 31.1* 31.6* 30.4*  MCV 95.8  --  95.4 93.4 94.6 93.8  PLT 290  --  274 249 253 301   Basic Metabolic Panel: Recent Labs  Lab 07/04/21 1323 07/04/21 1853 07/05/21 0449 07/05/21 1235 07/05/21 2118 07/06/21 0335 07/06/21 0737 07/07/21 0404 07/08/21 0456  NA 140 140 138  --   --   --  139 140 136  K 5.1 5.9* 5.8*   < > 5.1 5.1 5.0 5.0 5.0  CL 108  --  111  --   --   --  112* 111 112*  CO2 23  --  18*  --   --   --  19* 18* 18*  GLUCOSE 98  --  148*  --   --   --  131* 123* 137*  BUN 57*  --  60*  --   --   --  59* 56* 53*  CREATININE 1.68*  --  1.69*  --   --   --  1.70* 1.71* 1.72*  CALCIUM 9.1  --  8.9  --   --   --  8.8* 8.9 8.9  MG 1.7  --   --   --   --   --   --   --   --    < > = values in this interval not displayed.   Liver Function Tests: Recent Labs  Lab 07/04/21 1323  AST 15  ALT 15  ALKPHOS 94  BILITOT 0.6  PROT 7.7  ALBUMIN 3.0*   CBG: Recent Labs  Lab 07/07/21 1137 07/07/21 1614 07/07/21 2143 07/08/21 0715 07/08/21 1137  GLUCAP 175* 175* 172* 141* 215*    Discharge time spent: less than 30 minutes.  Signed: Alma Friendly, MD [facility] 07/08/2021

## 2021-07-08 NOTE — Progress Notes (Signed)
Occupational Therapy Treatment ?Patient Details ?Name: [name] ?MRN: [id number] ?DOB: 12-30-1934 ?Today's Date: 07/08/2021 ? ? ?History of present illness Pt is an 87 y/o female admitted secondary to syncope. PMH includes CKD, HTN, CAD, COPD, breast cancer, and PE. ?  ?OT comments ? Pt. Seen for skilled treatment session with PT.  Pt. Able to complete bed mobility and simulated bsc transfer to recliner. Education on recommended squat pivot vs. Her current tech. Educated also on increasing safety for her caregivers also.  Pt. Had already completed bsc transfer in/out of bed along with bathing.  This equaled a lot of activity within the hour and pt. With notable fatigue at end of session.  Current d/c recommendations remain appropriate.    ? ?Recommendations for follow up therapy are one component of a multi-disciplinary discharge planning process, led by the attending physician.  Recommendations may be updated based on patient status, additional functional criteria and insurance authorization. ?   ?Follow Up Recommendations ?    ?  ?Assistance Recommended at Discharge    ?Patient can return home with the following ?   ?  ?Equipment Recommendations ?    ?  ?Recommendations for Other Services   ? ?  ?Precautions / Restrictions Precautions ?Precautions: Fall ?Restrictions ?Weight Bearing Restrictions: No  ? ? ?  ? ?Mobility Bed Mobility ?Overal bed mobility: Needs Assistance ?Bed Mobility: Supine to Sit ?  ?  ?Supine to sit: Max assist ?  ?  ?General bed mobility comments: HOB flat. Heavy reliance on UE support of therapist with maxA to assist trunk. Patient stating her daughter usually stands in front of her and she pulls on her the same way as she did the therapist. ?  ? ?Transfers ?Overall transfer level: Needs assistance ?Equipment used: None ?Transfers: Bed to chair/wheelchair/BSC ?  ?  ?Squat pivot transfers: Min guard, +2 safety/equipment ?  ?  ?  ?General transfer comment: Patient simulated bed>chair  transfer as she would perform at home with chair directly anterior to her. Min guard+2 for safety while she performed squat pivot to the chair. Educated patient on this not being the safest technique due to fall risk and discussed with her on transfer technique with chair perpendicular to bed to reduce amount of steps and chances of falling. Unable to attempt new technique due to fatigue from previously transferring to/from [facility] with nurse tech within 30 minutes of therapist arriving ?  ?  ?Balance   ?  ?  ?  ?  ?  ?  ?  ?  ?  ?  ?  ?  ?  ?  ?  ?  ?  ?  ?   ? ?ADL either performed or assessed with clinical judgement  ? ?ADL Overall ADL's : Needs assistance/impaired ?  ?  ?  ?  ?  ?  ?  ?  ?  ?  ?  ?  ?Toilet Transfer: BSC/3in1;Min guard;+2 for physical assistance;Stand-pivot ?Toilet Transfer Details (indicate cue type and reason): version of a pivot-pt. makes complete circle with first direclty facing the bsc then turns fully and sits down (picture how rollator transfers are when pt. sits) ?  ?  ?  ?  ?Functional mobility during ADLs: +2 for physical assistance;Moderate assistance ?General ADL Comments: education on squat pivot vs. full circle transfers.  pt. had already transfered to/from bsc prior to our arrival plus the transfer to recliner with [place] and was unable to achieve full rise to attempt another  squat pivot. staff educated to have pt. attempt for back to bed vs. face first that pt. and dtr. who was present report her usual ?  ? ?Extremity/Trunk Assessment   ?  ?  ?  ?  ?  ? ?Vision   ?  ?  ?Perception   ?  ?Praxis   ?  ? ?Cognition Arousal/Alertness: Awake/alert ?Behavior During Therapy: [facility] for tasks assessed/performed ?Overall Cognitive Status: Within Functional Limits for tasks assessed ?  ?  ?  ?  ?  ?  ?  ?  ?  ?  ?  ?  ?  ?  ?  ?  ?General Comments: STM deficits as patient unable to recall previous session ?  ?  ?   ?Exercises   ? ?  ?Shoulder Instructions   ? ? ?  ?General Comments Daughter present  but not very interactive or supportive on providing assist  ? ? ?Pertinent Vitals/ Pain       Pain Assessment ?Pain Assessment: No/denies pain ? ?Home Living   ?  ?  ?  ?  ?  ?  ?  ?  ?  ?  ?  ?  ?  ?  ?  ?  ?  ?  ? ?  ?Prior Functioning/Environment    ?  ?  ?  ?   ? ?Frequency ?    ? ? ? ? ?  ?Progress Toward Goals ? ?OT Goals(current goals can now be found in the care plan section) ? Progress towards OT goals: Progressing toward goals ? ?   ?Plan     ? ?Co-evaluation ? ? ?   ?Reason for Co-Treatment: For patient/therapist safety;To address functional/ADL transfers ?PT goals addressed during session: Balance;Mobility/safety with mobility ?  ?  ? ?  ?AM-PAC OT "6 Clicks" Daily Activity     ?Outcome Measure ? ?   ?  ?  ?  ?  ?  ?  ? ?  ?End of Session Equipment Utilized During Treatment: Gait belt ? ?  ?  ?Activity Tolerance Patient limited by fatigue ?  ?Patient Left in chair;with call bell/phone within reach;with family/visitor present;with chair alarm set ?  ?Nurse Communication Other (comment) (reviewed with cna recommended way to transfer back to bed and need for pure wik placement prn) ?  ? ?   ? ?Time: 1131-1202 ?OT Time Calculation (min): 31 min ? ?Charges: OT General Charges ?$OT Visit: 1 Visit ?OT Treatments ?$Self Care/Home Management : 8-22 mins ? ? ? ?Tanya Nones ?07/08/2021, 1:22 PM ?

## 2021-07-11 ENCOUNTER — Other Ambulatory Visit: Payer: Self-pay | Admitting: Nurse Practitioner

## 2021-07-11 ENCOUNTER — Other Ambulatory Visit (HOSPITAL_COMMUNITY): Payer: Self-pay

## 2021-07-11 ENCOUNTER — Other Ambulatory Visit: Payer: Self-pay | Admitting: *Deleted

## 2021-07-11 DIAGNOSIS — K219 Gastro-esophageal reflux disease without esophagitis: Secondary | ICD-10-CM

## 2021-07-11 MED ORDER — PANTOPRAZOLE SODIUM 20 MG PO TBEC: 20.0000 mg | DELAYED_RELEASE_TABLET | Freq: Every day | ORAL | 5 refills | Status: DC

## 2021-07-11 NOTE — Telephone Encounter (Signed)
[facility] requested refill.  ?

## 2021-07-13 ENCOUNTER — Telehealth: Payer: Self-pay

## 2021-07-13 NOTE — Telephone Encounter (Signed)
Incoming call received form Eliezer Lofts with [facility], requesting verbal orders for home health nurse to educate patient on medication usage. ? ?Per [employer] standing order, verbal order given. ? ?I had a verbal conversation with [name], NP informing her of this encounter.  ?

## 2021-07-14 ENCOUNTER — Other Ambulatory Visit: Payer: Self-pay | Admitting: Nurse Practitioner

## 2021-07-14 ENCOUNTER — Other Ambulatory Visit: Payer: Self-pay | Admitting: Hematology

## 2021-07-14 DIAGNOSIS — G4700 Insomnia, unspecified: Secondary | ICD-10-CM | POA: Diagnosis not present

## 2021-07-14 DIAGNOSIS — E1140 Type 2 diabetes mellitus with diabetic neuropathy, unspecified: Secondary | ICD-10-CM | POA: Diagnosis not present

## 2021-07-14 DIAGNOSIS — E875 Hyperkalemia: Secondary | ICD-10-CM

## 2021-07-14 DIAGNOSIS — N1832 Chronic kidney disease, stage 3b: Secondary | ICD-10-CM | POA: Diagnosis not present

## 2021-07-14 DIAGNOSIS — I252 Old myocardial infarction: Secondary | ICD-10-CM | POA: Diagnosis not present

## 2021-07-14 DIAGNOSIS — D509 Iron deficiency anemia, unspecified: Secondary | ICD-10-CM | POA: Diagnosis not present

## 2021-07-14 DIAGNOSIS — I700 Atherosclerosis of aorta: Secondary | ICD-10-CM | POA: Diagnosis not present

## 2021-07-14 DIAGNOSIS — D529 Folate deficiency anemia, unspecified: Secondary | ICD-10-CM | POA: Diagnosis not present

## 2021-07-14 DIAGNOSIS — E1122 Type 2 diabetes mellitus with diabetic chronic kidney disease: Secondary | ICD-10-CM | POA: Diagnosis not present

## 2021-07-14 DIAGNOSIS — K279 Peptic ulcer, site unspecified, unspecified as acute or chronic, without hemorrhage or perforation: Secondary | ICD-10-CM | POA: Diagnosis not present

## 2021-07-14 DIAGNOSIS — D631 Anemia in chronic kidney disease: Secondary | ICD-10-CM | POA: Diagnosis not present

## 2021-07-14 DIAGNOSIS — F419 Anxiety disorder, unspecified: Secondary | ICD-10-CM | POA: Diagnosis not present

## 2021-07-14 DIAGNOSIS — J449 Chronic obstructive pulmonary disease, unspecified: Secondary | ICD-10-CM | POA: Diagnosis not present

## 2021-07-14 DIAGNOSIS — E785 Hyperlipidemia, unspecified: Secondary | ICD-10-CM | POA: Diagnosis not present

## 2021-07-14 DIAGNOSIS — M1030 Gout due to renal impairment, unspecified site: Secondary | ICD-10-CM | POA: Diagnosis not present

## 2021-07-14 DIAGNOSIS — F32A Depression, unspecified: Secondary | ICD-10-CM | POA: Diagnosis not present

## 2021-07-14 DIAGNOSIS — M150 Primary generalized (osteo)arthritis: Secondary | ICD-10-CM | POA: Diagnosis not present

## 2021-07-14 DIAGNOSIS — I25110 Atherosclerotic heart disease of native coronary artery with unstable angina pectoris: Secondary | ICD-10-CM | POA: Diagnosis not present

## 2021-07-14 DIAGNOSIS — I130 Hypertensive heart and chronic kidney disease with heart failure and stage 1 through stage 4 chronic kidney disease, or unspecified chronic kidney disease: Secondary | ICD-10-CM | POA: Diagnosis not present

## 2021-07-14 DIAGNOSIS — M353 Polymyalgia rheumatica: Secondary | ICD-10-CM | POA: Diagnosis not present

## 2021-07-14 DIAGNOSIS — G4733 Obstructive sleep apnea (adult) (pediatric): Secondary | ICD-10-CM | POA: Diagnosis not present

## 2021-07-14 DIAGNOSIS — F03A0 Unspecified dementia, mild, without behavioral disturbance, psychotic disturbance, mood disturbance, and anxiety: Secondary | ICD-10-CM | POA: Diagnosis not present

## 2021-07-14 DIAGNOSIS — G8929 Other chronic pain: Secondary | ICD-10-CM | POA: Diagnosis not present

## 2021-07-14 DIAGNOSIS — E1169 Type 2 diabetes mellitus with other specified complication: Secondary | ICD-10-CM | POA: Diagnosis not present

## 2021-07-14 DIAGNOSIS — I5032 Chronic diastolic (congestive) heart failure: Secondary | ICD-10-CM | POA: Diagnosis not present

## 2021-07-15 ENCOUNTER — Encounter: Payer: Self-pay | Admitting: Hematology

## 2021-07-15 ENCOUNTER — Encounter (HOSPITAL_COMMUNITY): Payer: Self-pay

## 2021-07-19 ENCOUNTER — Encounter: Payer: Self-pay | Admitting: Student in an Organized Health Care Education/Training Program

## 2021-07-19 ENCOUNTER — Other Ambulatory Visit: Payer: Self-pay

## 2021-07-19 ENCOUNTER — Ambulatory Visit
Payer: HMO | Attending: Student in an Organized Health Care Education/Training Program | Admitting: Student in an Organized Health Care Education/Training Program

## 2021-07-19 VITALS — BP 130/55 | HR 80 | Temp 97.3°F | Resp 16 | Ht 67.0 in | Wt 333.0 lb

## 2021-07-19 DIAGNOSIS — M5416 Radiculopathy, lumbar region: Secondary | ICD-10-CM | POA: Insufficient documentation

## 2021-07-19 DIAGNOSIS — M79641 Pain in right hand: Secondary | ICD-10-CM | POA: Diagnosis not present

## 2021-07-19 DIAGNOSIS — G8929 Other chronic pain: Secondary | ICD-10-CM

## 2021-07-19 DIAGNOSIS — M47816 Spondylosis without myelopathy or radiculopathy, lumbar region: Secondary | ICD-10-CM | POA: Diagnosis not present

## 2021-07-19 DIAGNOSIS — G894 Chronic pain syndrome: Secondary | ICD-10-CM | POA: Diagnosis not present

## 2021-07-19 MED ORDER — OXYCODONE HCL 5 MG PO TABS: 5.0000 mg | ORAL_TABLET | Freq: Two times a day (BID) | ORAL | 0 refills | Status: DC | PRN

## 2021-07-19 MED ORDER — OXYCODONE HCL 5 MG PO TABS: 5.0000 mg | ORAL_TABLET | Freq: Two times a day (BID) | ORAL | 0 refills | Status: AC | PRN

## 2021-07-19 NOTE — Addendum Note (Signed)
Addended by: Gillis Santa on: 07/19/2021 02:26 PM ? ? Modules accepted: Orders ? ?

## 2021-07-19 NOTE — Progress Notes (Addendum)
PROVIDER NOTE: Information contained herein reflects review and annotations entered in association with encounter. Interpretation of such information and data should be left to medically-trained personnel. Information provided to patient can be located elsewhere in the medical record under "Patient Instructions". Document created using STT-dictation technology, any transcriptional errors that may result from process are unintentional.  ?  ?Patient: [name]  Service Category: E/M  Provider: Gillis Santa, MD  ?DOB: 1935/03/23  DOS: 07/19/2021  Specialty: Interventional Pain Management  ?MRN: [id number]  Setting: Ambulatory outpatient  PCP: Nichole Chandler, NP  ?Type: Established Patient    Referring Provider: [name], NP  ?Location: Office  Delivery: Face-to-face    ? ?HPI  ?[name], a 87 y.o. year old female, is here today because of her Chronic pain of right hand [M79.641, G89.29]. [name] primary complain today is Knee Pain (bilat), Hand Pain (bilat), Shoulder Pain (bilat), Foot Pain (bilat), and Leg Pain (Bilat lower) ? ?Last encounter: My last encounter with her was on 04/27/21 ? ?Pertinent problems: [name] has Severe episode of recurrent major depressive disorder, without psychotic features ([name]); Chronic radicular lumbar pain; Lumbar spondylosis; Spinal stenosis, lumbar region, with neurogenic claudication; Bilateral primary osteoarthritis of knee; and Pain management contract signed on their pertinent problem list. ?Pain Assessment: Severity of Chronic pain is reported as a 7 /10. Location:  (knees, hands, shoulders, feet)  / . Onset: More than a month ago. Quality: Burning, Aching. Timing: Constant. Modifying factor(s): meds. ?Vitals:  height is '5\' 7"'$  (1.702 m) and weight is 333 lb (151 kg) (abnormal). Her temporal temperature is 97.3 ?F (36.3 ?C) (abnormal). Her blood pressure is 130/55 (abnormal) and her pulse is 80. Her respiration is 16 and oxygen saturation is 100%.   ? ?Reason for encounter: medication management.   ? ?[name] follows up today for medication management.  Since her last clinic visit with me, unfortunately she was admitted to the hospital for 5 days for a syncopal episode that occurred on 06/14/2021.  An exhaustive work-up was done which was largely negative. ?They believe her syncopal episode was related to blood pressure medications which they have adjusted.  She continues her Percocet 5 mg which she usually takes twice daily to help alleviate her pain.  She is on many medications and I am concerned about polypharmacy.  I believe she is on the lowest effective dose possible for Percocet even though she is requesting for dose increase which I do not recommend in the context of her medical comorbidities. ? ?Pharmacotherapy Assessment  ? ? ?Analgesic: ?Percocet 5 mg twice daily as needed, quantity 60/month ? ?Monitoring: ?[place] PMP: PDMP reviewed during this encounter.       ?Pharmacotherapy: No side-effects or adverse reactions reported. ?Compliance: No problems identified. ?Effectiveness: Clinically acceptable. ? ?Rise Patience, RN  07/19/2021  2:02 PM  Signed ?Nursing Pain Medication Assessment:  ?Safety precautions to be maintained throughout the outpatient stay will include: orient to surroundings, keep bed in low position, maintain call bell within reach at all times, provide assistance with transfer out of bed and ambulation.  ?Medication Inspection Compliance: Pill count conducted under aseptic conditions, in front of the patient. Neither the pills nor the bottle was removed from the patient's sight at any time. Once count was completed pills were immediately returned to the patient in their original bottle. ? ?Medication: Oxycodone IR ?Pill/Patch Count:  04 of 60 pills remain ?Pill/Patch Appearance: Markings consistent with prescribed medication ?Bottle Appearance: Standard  pharmacy container. Clearly labeled. ?Filled Date: 02 / 03 / 2023 ?Last Medication  intake:  07/17/21 ?  ?  UDS: : Final result   ?Visible to patient: Yes (not seen)   ?Next appt: 10/26/2020 at 01:45 PM in Podiatry [name], DPM)   ?Dx: Chronic pain syndrome   ?0 Result Notes ? ? Ref Range & Units 6 mo ago  ?Amphetamines, IA Cutoff:50 ng/mL Negative   ?Barbiturates, IA Cutoff:0.1 ug/mL Negative   ?Benzodiazepines, IA Cutoff:20 ng/mL Negative   ?Cocaine & Metabolite, IA Cutoff:25 ng/mL Negative   ?Phencyclidine, IA Cutoff:8 ng/mL Negative   ?THC(Marijuana) Metabolite, IA Cutoff:5 ng/mL Negative   ?Opiates, IA Cutoff:5 ng/mL Negative   ?Oxycodones, IA Cutoff:5 ng/mL Negative   ?Comment: Presumptive immunoassay result indicated need for further  ?testing; definitive confirmation was negative.   ?Methadone, IA Cutoff:25 ng/mL Negative   ?Propoxyphene, IA Cutoff:50 ng/mL Negative   ?Comment: This test was developed and its performance characteristics  ?determined by [facility].  It has not been cleared or approved  ?by the Food and Drug Administration.   ?  ? ?  ? ?ROS  ?Constitutional: Denies any fever or chills ?Gastrointestinal: No reported hemesis, hematochezia, vomiting, or acute GI distress ?Musculoskeletal:  left shoulder pain, right hand pain ?Neurological: No reported episodes of acute onset apraxia, aphasia, dysarthria, agnosia, amnesia, paralysis, loss of coordination, or loss of consciousness ? ?Medication Review  ?DULoxetine, FreeStyle Libre 14 Day Sensor, Insulin Aspart, Vitamin D (Ergocalciferol), albuterol, anastrozole, aspirin, atorvastatin, budesonide-formoterol, carvedilol, cetirizine, clopidogrel, diclofenac Sodium, febuxostat, fluticasone, folic acid, furosemide, guaiFENesin, insulin glargine (2 Unit Dial), isosorbide mononitrate, linaclotide, memantine, nitroGLYCERIN, nystatin cream, oxyCODONE, pantoprazole, polyethylene glycol powder, pregabalin, sodium polystyrene, topiramate, and vitamin B-12 ? ?History Review  ?Allergy: [name] is allergic to sulfonamide derivatives,  aricept [donepezil hcl], and tramadol. ?Drug: [name]  reports no history of drug use. ?Alcohol:  reports no history of alcohol use. ?Tobacco:  reports that she has never smoked. She has been exposed to tobacco smoke. She has never used smokeless tobacco. ?Social: [name]  reports that she has never smoked. She has been exposed to tobacco smoke. She has never used smokeless tobacco. She reports that she does not drink alcohol and does not use drugs. ?Medical:  has a past medical history of Allergy, Anemia, unspecified, Anxiety, Arthritis, Breast cancer ([place]), Chronic diastolic CHF (congestive heart failure) ([place]), CKD (chronic kidney disease), stage III ([place]), Coronary artery disease, Dementia ([place]), Depression, Diabetes mellitus, GERD (gastroesophageal reflux disease), Gout, unspecified, Headache, History of blood clots, Hyperlipidemia, Hypertension, Insomnia, Joint pain, Joint swelling, Morbid obesity ([place]), Myocardial infarction ([place]) (2004), Non-STEMI (non-ST elevated myocardial infarction) ([place]) (02/15/2020), Numbness, Obstructive sleep apnea, Osteoarthritis, Osteoarthritis, Osteoarthrosis, unspecified whether generalized or localized, lower leg, Pain, chronic, Polymyalgia rheumatica ([place]), Pulmonary emboli ([place]) (01/2012), Urinary incontinence, Vertigo, and Wears dentures. ?Surgical: Ms. Dobos  has a past surgical history that includes Appendectomy; Cardiac catheterization; Gastric bypass (1977 ); blood clots/legs and lungs (2013); MI with stent placement (2004); left heart catheterization with coronary angiogram (N/A, 06/29/2014); Coronary angioplasty (2); Eye surgery (Bilateral); Colonoscopy; Abdominal hysterectomy; Breast lumpectomy with radioactive seed localization (Left, 11/05/2014); Excision of skin tag (Right, 11/05/2014); Breast lumpectomy (Left, 11/05/2014); Breast biopsy (Left, 07/22/2014); Breast biopsy (Left, 02/10/2013); Esophagogastroduodenoscopy (egd) with propofol (N/A, 11/07/2016); Pars plana  vitrectomy (Right, 10/23/2018); Membrane peel (Right, 10/23/2018); LEFT HEART CATH AND CORONARY ANGIOGRAPHY (N/A, 02/17/2020); and CORONARY STENT INTERVENTION (N/A, 02/17/2020). ?Family: family history include

## 2021-07-19 NOTE — Progress Notes (Signed)
Nursing Pain Medication Assessment:  ?Safety precautions to be maintained throughout the outpatient stay will include: orient to surroundings, keep bed in low position, maintain call bell within reach at all times, provide assistance with transfer out of bed and ambulation.  ?Medication Inspection Compliance: Pill count conducted under aseptic conditions, in front of the patient. Neither the pills nor the bottle was removed from the patient's sight at any time. Once count was completed pills were immediately returned to the patient in their original bottle. ? ?Medication: Oxycodone IR ?Pill/Patch Count:  04 of 60 pills remain ?Pill/Patch Appearance: Markings consistent with prescribed medication ?Bottle Appearance: Standard pharmacy container. Clearly labeled. ?Filled Date: 02 / 03 / 2023 ?Last Medication intake:  07/17/21 ?

## 2021-07-21 DIAGNOSIS — I252 Old myocardial infarction: Secondary | ICD-10-CM | POA: Diagnosis not present

## 2021-07-21 DIAGNOSIS — G4700 Insomnia, unspecified: Secondary | ICD-10-CM | POA: Diagnosis not present

## 2021-07-21 DIAGNOSIS — I700 Atherosclerosis of aorta: Secondary | ICD-10-CM | POA: Diagnosis not present

## 2021-07-21 DIAGNOSIS — E1169 Type 2 diabetes mellitus with other specified complication: Secondary | ICD-10-CM | POA: Diagnosis not present

## 2021-07-21 DIAGNOSIS — F03A0 Unspecified dementia, mild, without behavioral disturbance, psychotic disturbance, mood disturbance, and anxiety: Secondary | ICD-10-CM | POA: Diagnosis not present

## 2021-07-21 DIAGNOSIS — G4733 Obstructive sleep apnea (adult) (pediatric): Secondary | ICD-10-CM | POA: Diagnosis not present

## 2021-07-21 DIAGNOSIS — G8929 Other chronic pain: Secondary | ICD-10-CM | POA: Diagnosis not present

## 2021-07-21 DIAGNOSIS — I130 Hypertensive heart and chronic kidney disease with heart failure and stage 1 through stage 4 chronic kidney disease, or unspecified chronic kidney disease: Secondary | ICD-10-CM | POA: Diagnosis not present

## 2021-07-21 DIAGNOSIS — M353 Polymyalgia rheumatica: Secondary | ICD-10-CM | POA: Diagnosis not present

## 2021-07-21 DIAGNOSIS — E1140 Type 2 diabetes mellitus with diabetic neuropathy, unspecified: Secondary | ICD-10-CM | POA: Diagnosis not present

## 2021-07-21 DIAGNOSIS — N1832 Chronic kidney disease, stage 3b: Secondary | ICD-10-CM | POA: Diagnosis not present

## 2021-07-21 DIAGNOSIS — E1122 Type 2 diabetes mellitus with diabetic chronic kidney disease: Secondary | ICD-10-CM | POA: Diagnosis not present

## 2021-07-21 DIAGNOSIS — E785 Hyperlipidemia, unspecified: Secondary | ICD-10-CM | POA: Diagnosis not present

## 2021-07-21 DIAGNOSIS — J449 Chronic obstructive pulmonary disease, unspecified: Secondary | ICD-10-CM | POA: Diagnosis not present

## 2021-07-21 DIAGNOSIS — F32A Depression, unspecified: Secondary | ICD-10-CM | POA: Diagnosis not present

## 2021-07-21 DIAGNOSIS — D509 Iron deficiency anemia, unspecified: Secondary | ICD-10-CM | POA: Diagnosis not present

## 2021-07-21 DIAGNOSIS — D631 Anemia in chronic kidney disease: Secondary | ICD-10-CM | POA: Diagnosis not present

## 2021-07-21 DIAGNOSIS — D529 Folate deficiency anemia, unspecified: Secondary | ICD-10-CM | POA: Diagnosis not present

## 2021-07-21 DIAGNOSIS — I5032 Chronic diastolic (congestive) heart failure: Secondary | ICD-10-CM | POA: Diagnosis not present

## 2021-07-21 DIAGNOSIS — M1030 Gout due to renal impairment, unspecified site: Secondary | ICD-10-CM | POA: Diagnosis not present

## 2021-07-21 DIAGNOSIS — F419 Anxiety disorder, unspecified: Secondary | ICD-10-CM | POA: Diagnosis not present

## 2021-07-21 DIAGNOSIS — K279 Peptic ulcer, site unspecified, unspecified as acute or chronic, without hemorrhage or perforation: Secondary | ICD-10-CM | POA: Diagnosis not present

## 2021-07-21 DIAGNOSIS — I25110 Atherosclerotic heart disease of native coronary artery with unstable angina pectoris: Secondary | ICD-10-CM | POA: Diagnosis not present

## 2021-07-21 DIAGNOSIS — M150 Primary generalized (osteo)arthritis: Secondary | ICD-10-CM | POA: Diagnosis not present

## 2021-07-22 ENCOUNTER — Telehealth: Payer: Self-pay | Admitting: *Deleted

## 2021-07-22 NOTE — Telephone Encounter (Signed)
Received fax from [facility] [phone] Fax:[phone] ?Patient has recently enrolled in [facility] for 2023. As a requirement, the plan must obtain documentation from the provider that patient has one qualifying condition.  ? ?Placed Verification Form in [place] folder to fill out and sign.  ?Once Completed to be faxed back to Fax: [phone] ?

## 2021-07-27 ENCOUNTER — Telehealth: Payer: Self-pay

## 2021-07-27 NOTE — Telephone Encounter (Signed)
Are they wanting [place] to reach  out to another agency or do they have one in mind? Lattie Haw can you follow up on this as well.  ?

## 2021-07-27 NOTE — Telephone Encounter (Signed)
Nue from [facility] called and stated that the patient would like verbal orders to change her home care services.  ?Patient's daughter stated that, patient "needs more care due to past hospitalization visit" and believes that the home care she is receiving now does come as often due, to staffing issues.  ?Nue could not tell me the name of the new Home Care services that the daughter requested.  ? ?Please advise. Message routed to [name], NP ? ?

## 2021-07-28 NOTE — Telephone Encounter (Signed)
Patients daughter stated that she did not have a specific agency in mind but would like to switch to a different agency due to the short staffing and increase in her mothers care.  ?

## 2021-07-28 NOTE — Telephone Encounter (Signed)
Left voicemail for Nue at [name]. ?

## 2021-07-28 NOTE — Telephone Encounter (Signed)
Nue from [facility] called back and transferred call to [facility], referral coordinator for additional questions regarding [facility].  ?

## 2021-08-01 ENCOUNTER — Other Ambulatory Visit: Payer: Self-pay | Admitting: Nurse Practitioner

## 2021-08-02 ENCOUNTER — Telehealth: Payer: Self-pay | Admitting: *Deleted

## 2021-08-02 NOTE — Telephone Encounter (Signed)
Received fax from [facility] [phone] regarding patient taking 3 or more central nervous system agents.  ? ?Patient is not suppose to be taking Lyrica nor Topiramate per [name]. [name] stated that these medications were Discontinued at last OV.  ? ?I called and spoke with [name], daughter and she stated that patient was still taking them but will remove them from her medications and Discontinue.  Agreed.  ? ?I called the pharmacy, [facility] and spoke with Cecille Rubin and she stated that she will remove them from Patient's list as well and will not refill.  ? ?Form faxed back to [facility] [phone] ?Form sent to [place].  ?

## 2021-08-08 ENCOUNTER — Other Ambulatory Visit: Payer: Self-pay | Admitting: Internal Medicine

## 2021-08-08 DIAGNOSIS — E1140 Type 2 diabetes mellitus with diabetic neuropathy, unspecified: Secondary | ICD-10-CM

## 2021-08-11 ENCOUNTER — Telehealth: Payer: Self-pay | Admitting: *Deleted

## 2021-08-11 NOTE — Telephone Encounter (Signed)
Nue with [facility] called and stated that patient had concerns with her Home Health. Stated that patient needed extra care with Home Health and an aid. Also stated that they were not sure of next upcoming appointment. Stated that patient was pretty much bedbound.  ? ?I tried calling patient, LMOM to return call.  ?

## 2021-08-15 NOTE — Progress Notes (Deleted)
?Cardiology Office Note:   ? ?Date:  08/15/2021  ? ?ID:  [name], DOB 1935-04-11, MRN [id number] ? ?PCP:  [name], NP ?  ?[facility] Providers ?Cardiologist:  Ena Dawley, MD { ? ? ?Referring MD: [name], NP  ? ? ?History of Present Illness:   ? ?[name] is a 87 y.o. female with a hx of dementia, CAD (BMS to RCA 2004, DESx2 to mid and proximal RCA 2012, recent NSTEMI 02/2020 s/p DES to RCA), chronic diastolic CHF, DM, morbid obesity, remote gastric bypass and reversal, anemia, PMR, HLD, depression, OCD, anxiety, OA, COPD, prior PE/DVT, CKD stage III, breast CA who presents to clinic for follow-up. ? ?Per review of the record, the patient has history of multiple medical problems as outlined above. She was admitted 02/2020 with chest pain/NSTEMI and underwent cath showing multivessel CAD but severe ISR in pRCA and m/dRCA lesion both treated with DES. There was diffuse mild to moderate disease in the LM as well as Lcx and LAD but nonobstructive and treated medically. LVEF was normal, borderline elevated LVEDP. 2D echo was technically difficult given habitus. Hospital course also notable for anemia requiring blood transfusion (has prior h/o anemia). IM discussed case with Dr. Irene Limbo, her oncologist, regarding ongoing maintenance Xarelto for prior h/o PE/DVT and the mutual decision was made to hold further Xarelto given need for DAPT and history of anemia.  ? ?Was seen in 02/2020 with [name] where she was tachycardic with elevated D-dimer. V/Q scan obtained without evidence of PE. ? ?Was last seen in clinic on 05/2021 where she was having dizziness. Otherwise was doing well from a CV standpoint. ? ?Today, *** ? ?Past Medical History:  ?Diagnosis Date  ? Allergy   ? takes Mucinex daily as needed  ? Anemia, unspecified   ? Anxiety   ? takes Clonazepam daily as needed  ? Arthritis   ? Breast cancer ([place])   ? Chronic diastolic CHF (congestive heart failure) ([place])   ? takes Furosemide  daily  ? CKD (chronic kidney disease), stage III ([place])   ? Coronary artery disease   ? a. s/p IMI 2004 tx with BMS to RCA;  b. s/p Promus DES to RCA 2/12 c. abnormal nuc 2016 -> cath with med rx. d. NSTEMI 02/2020  mv CAD with severe ISR in pRCA and m/dRCA lesion both treated with DES.  ? Dementia ([place])   ? Depression   ? takes Cymbalta daily  ? Diabetes mellitus   ? insulin daily  ? GERD (gastroesophageal reflux disease)   ? takes Protonix daily  ? Gout, unspecified   ? Headache   ? occasionally  ? History of blood clots   ? Hyperlipidemia   ? takes Pravastatin daily  ? Hypertension   ? Insomnia   ? Joint pain   ? Joint swelling   ? Morbid obesity ([place])   ? Myocardial infarction [facility]) 2004  ? Non-STEMI (non-ST elevated myocardial infarction) ([place]) 02/15/2020  ? Numbness   ? Obstructive sleep apnea   ? does not wear cpap  ? Osteoarthritis   ? Osteoarthritis   ? Osteoarthrosis, unspecified whether generalized or localized, lower leg   ? Pain, chronic   ? Polymyalgia rheumatica ([place])   ? Pulmonary emboli ([place]) 01/2012  ? felt to need lifelong anticoagulation but Xarelto dc in 02/2020 due to need for DAPT  ? Urinary incontinence   ? takes Linzess daily  ? Vertigo   ? hx of;was  taking Meclizine if needed  ? Wears dentures   ? ? ?Past Surgical History:  ?Procedure Laterality Date  ? ABDOMINAL HYSTERECTOMY    ? partial  ? APPENDECTOMY    ? blood clots/legs and lungs  2013  ? BREAST BIOPSY Left 07/22/2014  ? BREAST BIOPSY Left 02/10/2013  ? BREAST LUMPECTOMY Left 11/05/2014  ? BREAST LUMPECTOMY WITH RADIOACTIVE SEED LOCALIZATION Left 11/05/2014  ? Procedure: LEFT BREAST LUMPECTOMY WITH RADIOACTIVE SEED LOCALIZATION;  Surgeon: [name], MD;  Location: [place];  Service: General;  Laterality: Left;  ? CARDIAC CATHETERIZATION    ? COLONOSCOPY    ? CORONARY ANGIOPLASTY  2  ? CORONARY STENT INTERVENTION N/A 02/17/2020  ? Procedure: CORONARY STENT INTERVENTION;  Surgeon: Leonie Man, MD;  Location: [place] [facility];   Service: Cardiovascular;  Laterality: N/A;  ? ESOPHAGOGASTRODUODENOSCOPY (EGD) WITH PROPOFOL N/A 11/07/2016  ? Procedure: ESOPHAGOGASTRODUODENOSCOPY (EGD) WITH PROPOFOL;  Surgeon: Gatha Mayer, MD;  [facility]: [facility];  Service: Endoscopy;  Laterality: N/A;  ? EXCISION OF SKIN TAG Right 11/05/2014  ? Procedure: EXCISION OF RIGHT EYELID SKIN TAG;  Surgeon: [name], MD;  Location: [employer];  Service: General;  Laterality: Right;  ? EYE SURGERY Bilateral   ? cataract   ? GASTRIC BYPASS  1977   ? reversed in 1979, [place] CATH AND CORONARY ANGIOGRAPHY N/A 02/17/2020  ? Procedure: LEFT HEART CATH AND CORONARY ANGIOGRAPHY;  Surgeon: Leonie Man, MD;  Location: [place] [facility];  Service: Cardiovascular;  Laterality: N/A;  ? LEFT HEART CATHETERIZATION WITH CORONARY ANGIOGRAM N/A 06/29/2014  ? Procedure: LEFT HEART CATHETERIZATION WITH CORONARY ANGIOGRAM;  Surgeon: Troy Sine, MD;  [facility];  Service: Cardiovascular;  Laterality: N/A;  ? MEMBRANE PEEL Right 10/23/2018  ? Procedure: MEMBRANE PEEL;  Surgeon: Hurman Horn, MD;  Location: [place];  Service: Ophthalmology;  Laterality: Right;  ? MI with stent placement  2004  ? PARS PLANA VITRECTOMY Right 10/23/2018  ? Procedure: PARS PLANA VITRECTOMY WITH 25 GAUGE;  Surgeon: Hurman Horn, MD;  Location: [place];  Service: Ophthalmology;  Laterality: Right;  ? ? ?Current Medications: ?No outpatient medications have been marked as taking for the 08/23/21 encounter (Appointment) with Freada Bergeron, MD.  ?  ? ?Allergies:   Sulfonamide derivatives, Aricept [donepezil hcl], and Tramadol  ? ?Social History  ? ?Socioeconomic History  ? Marital status: Widowed  ?  Spouse name: Not on file  ? Number of children: Not on file  ? Years of education: Not on file  ? Highest education level: Not on file  ?Occupational History  ? Occupation: retired  ?Tobacco Use  ? Smoking status: Never  ?  Passive exposure: Past  ? Smokeless tobacco:  Never  ? Tobacco comments:  ?  Both parents smoked, patient was exposed/ "Raised up in smoke, my whole life."  ?Vaping Use  ? Vaping Use: Never used  ?Substance and Sexual Activity  ? Alcohol use: No  ?  Alcohol/week: 0.0 standard drinks  ? Drug use: No  ? Sexual activity: Not Currently  ?Other Topics Concern  ? Not on file  ?Social History Narrative  ? Not on file  ? ?Social Determinants of Health  ? ?Financial Resource Strain: Not on file  ?Food Insecurity: Not on file  ?Transportation Needs: Not on file  ?Physical Activity: Not on file  ?Stress: Not on file  ?Social Connections: Not on file  ?  ? ?  Family History: ?[name] family history includes Arthritis in her father and sister; Breast cancer (age of onset: 89) in her mother; Diabetes in her father and sister; Heart disease in her cousin and mother; Hypertension in her father; Obesity in her sister; Throat cancer in her father. There is no history of Colon cancer, Stomach cancer, or Esophageal cancer. ? ?ROS:   ?Please see the history of present illness.    ?Review of Systems  ?Constitutional:  Positive for malaise/fatigue.  ?Respiratory:  Negative for shortness of breath.   ?Cardiovascular:  Positive for leg swelling. Negative for chest pain, palpitations, orthopnea and claudication.  ?Gastrointestinal:  Negative for blood in stool and melena.  ?Genitourinary:  Negative for hematuria.  ?Musculoskeletal:  Negative for falls.  ?Neurological:  Positive for dizziness. Negative for loss of consciousness.  ?Psychiatric/Behavioral:  Positive for depression.    ? ?EKGs/Labs/Other Studies Reviewed:   ? ?The following studies were reviewed today: ?2D Echo 02/15/20 ? 1. Technically difficult echo with poor image quality.  ? 2. Left ventricular ejection fraction, by estimation, is 65 to 70%. The  ?left ventricle has normal function. The left ventricle has no regional  ?wall motion abnormalities. Left ventricular diastolic parameters are  ?consistent with Grade I  diastolic  ?dysfunction (impaired relaxation).  ? 3. Right ventricular systolic function was not well visualized. The right  ?ventricular size is not well visualized.  ? 4. The mitral valve was not well v

## 2021-08-22 ENCOUNTER — Ambulatory Visit: Payer: Medicare HMO | Admitting: Cardiology

## 2021-08-23 ENCOUNTER — Encounter: Payer: Self-pay | Admitting: Hematology

## 2021-08-23 ENCOUNTER — Encounter (HOSPITAL_COMMUNITY): Payer: Self-pay

## 2021-08-23 ENCOUNTER — Encounter: Payer: Self-pay | Admitting: Cardiology

## 2021-08-23 ENCOUNTER — Ambulatory Visit (INDEPENDENT_AMBULATORY_CARE_PROVIDER_SITE_OTHER): Payer: HMO | Admitting: Cardiology

## 2021-08-23 VITALS — BP 128/80 | HR 70 | Ht 67.0 in | Wt 309.0 lb

## 2021-08-23 DIAGNOSIS — E119 Type 2 diabetes mellitus without complications: Secondary | ICD-10-CM | POA: Diagnosis not present

## 2021-08-23 DIAGNOSIS — I5032 Chronic diastolic (congestive) heart failure: Secondary | ICD-10-CM | POA: Diagnosis not present

## 2021-08-23 DIAGNOSIS — Z79899 Other long term (current) drug therapy: Secondary | ICD-10-CM | POA: Diagnosis not present

## 2021-08-23 DIAGNOSIS — I10 Essential (primary) hypertension: Secondary | ICD-10-CM

## 2021-08-23 DIAGNOSIS — Z86711 Personal history of pulmonary embolism: Secondary | ICD-10-CM

## 2021-08-23 DIAGNOSIS — I25118 Atherosclerotic heart disease of native coronary artery with other forms of angina pectoris: Secondary | ICD-10-CM | POA: Diagnosis not present

## 2021-08-23 NOTE — Patient Instructions (Signed)
Medication Instructions:  ? ?Your physician recommends that you continue on your current medications as directed. Please refer to the Current Medication list given to you today. ? ?*If you need a refill on your cardiac medications before your next appointment, please call your pharmacy* ? ? ?Follow-Up: ?At [facility], you and your health needs are our priority.  As part of our continuing mission to provide you with exceptional heart care, we have created designated Provider Care Teams.  These Care Teams include your primary Cardiologist (physician) and Advanced Practice Providers (APPs -  Physician Assistants and Nurse Practitioners) who all work together to provide you with the care you need, when you need it. ? ?We recommend signing up for the patient portal called "MyChart".  Sign up information is provided on this After Visit Summary.  MyChart is used to connect with patients for Virtual Visits (Telemedicine).  Patients are able to view lab/test results, encounter notes, upcoming appointments, etc.  Non-urgent messages can be sent to your provider as well.   ?To learn more about what you can do with MyChart, go to [email or web address].   ? ?Your next appointment:   ?4 - 6 month(s) ? ?The format for your next appointment:   ?In Person ? ?Provider:   ?DR. PEMBERTON ? ?Important Information About Sugar ? ? ? ? ? ? ?

## 2021-08-23 NOTE — Progress Notes (Signed)
?Cardiology Office Note:   ? ?Date:  08/23/2021  ? ?ID:  [name], DOB October 18, 1934, MRN [id number] ? ?PCP:  [name], NP ?  ?[facility] Providers ?Cardiologist:  Ena Dawley, MD { ? ? ?Referring MD: [name], NP  ? ? ?History of Present Illness:   ? ?[name] is a 87 y.o. female with a hx of dementia, CAD (BMS to RCA 2004, DESx2 to mid and proximal RCA 2012, recent NSTEMI 02/2020 s/p DES to RCA), chronic diastolic CHF, DM, morbid obesity, remote gastric bypass and reversal, anemia, PMR, HLD, depression, OCD, anxiety, OA, COPD, prior PE/DVT, CKD stage III, breast CA who presents to clinic for follow-up. ? ?Per review of the record, the patient has history of multiple medical problems as outlined above. She was admitted 02/2020 with chest pain/NSTEMI and underwent cath showing multivessel CAD but severe ISR in pRCA and m/dRCA lesion both treated with DES. There was diffuse mild to moderate disease in the LM as well as Lcx and LAD but nonobstructive and treated medically. LVEF was normal, borderline elevated LVEDP. 2D echo was technically difficult given habitus. Hospital course also notable for anemia requiring blood transfusion (has prior h/o anemia). IM discussed case with Dr. Irene Limbo, her oncologist, regarding ongoing maintenance Xarelto for prior h/o PE/DVT and the mutual decision was made to hold further Xarelto given need for DAPT and history of anemia.  ? ?Was seen in 02/2020 with [name] where she was tachycardic with elevated D-dimer. V/Q scan obtained without evidence of PE. ? ?Was last seen in clinic on 05/2021 where she was having dizziness. Otherwise was doing well from a CV standpoint. ? ?Today, she is accompanied by her granddaughter and not doing well. Her daughter was also contacted via phone. For the past 2 to 3 weeks, she has been feeling poorly with headaches and indigestion. The oxycodone does not completely improve her symptoms and used to cause constipation.  However, recently, she has been having frequent bowel movements with loose stool up to 7 times a day. She denies blood in the stool. ? ?Her daughter reports her blood pressure at home is normal. No current orthostatic symptoms. She reports she drinks plenty of water throughout the day. She denies chest pain, chest pressure, dyspnea at rest or with exertion, palpitations, PND, orthopnea, or syncope.  ? ?Her daughter wonders if her headaches and dizziness are related to allergies. She endorses an occasional cough from the back of her throat.  ? ?She endorses occasional bilateral LE swelling. The swelling is worse at the end of the day and improved with elevation.   ? ?Past Medical History:  ?Diagnosis Date  ? Allergy   ? takes Mucinex daily as needed  ? Anemia, unspecified   ? Anxiety   ? takes Clonazepam daily as needed  ? Arthritis   ? Breast cancer ([place])   ? Chronic diastolic CHF (congestive heart failure) ([place])   ? takes Furosemide daily  ? CKD (chronic kidney disease), stage III ([place])   ? Coronary artery disease   ? a. s/p IMI 2004 tx with BMS to RCA;  b. s/p Promus DES to RCA 2/12 c. abnormal nuc 2016 -> cath with med rx. d. NSTEMI 02/2020  mv CAD with severe ISR in pRCA and m/dRCA lesion both treated with DES.  ? Dementia ([place])   ? Depression   ? takes Cymbalta daily  ? Diabetes mellitus   ? insulin daily  ? GERD (gastroesophageal reflux disease)   ?  takes Protonix daily  ? Gout, unspecified   ? Headache   ? occasionally  ? History of blood clots   ? Hyperlipidemia   ? takes Pravastatin daily  ? Hypertension   ? Insomnia   ? Joint pain   ? Joint swelling   ? Morbid obesity ([place])   ? Myocardial infarction [facility]) 2004  ? Non-STEMI (non-ST elevated myocardial infarction) ([place]) 02/15/2020  ? Numbness   ? Obstructive sleep apnea   ? does not wear cpap  ? Osteoarthritis   ? Osteoarthritis   ? Osteoarthrosis, unspecified whether generalized or localized, lower leg   ? Pain, chronic   ? Polymyalgia rheumatica ([place])   ?  Pulmonary emboli ([place]) 01/2012  ? felt to need lifelong anticoagulation but Xarelto dc in 02/2020 due to need for DAPT  ? Urinary incontinence   ? takes Linzess daily  ? Vertigo   ? hx of;was taking Meclizine if needed  ? Wears dentures   ? ? ?Past Surgical History:  ?Procedure Laterality Date  ? ABDOMINAL HYSTERECTOMY    ? partial  ? APPENDECTOMY    ? blood clots/legs and lungs  2013  ? BREAST BIOPSY Left 07/22/2014  ? BREAST BIOPSY Left 02/10/2013  ? BREAST LUMPECTOMY Left 11/05/2014  ? BREAST LUMPECTOMY WITH RADIOACTIVE SEED LOCALIZATION Left 11/05/2014  ? Procedure: LEFT BREAST LUMPECTOMY WITH RADIOACTIVE SEED LOCALIZATION;  Surgeon: [name], MD;  Location: [place];  Service: General;  Laterality: Left;  ? CARDIAC CATHETERIZATION    ? COLONOSCOPY    ? CORONARY ANGIOPLASTY  2  ? CORONARY STENT INTERVENTION N/A 02/17/2020  ? Procedure: CORONARY STENT INTERVENTION;  Surgeon: Leonie Man, MD;  Location: [place] [facility];  Service: Cardiovascular;  Laterality: N/A;  ? ESOPHAGOGASTRODUODENOSCOPY (EGD) WITH PROPOFOL N/A 11/07/2016  ? Procedure: ESOPHAGOGASTRODUODENOSCOPY (EGD) WITH PROPOFOL;  Surgeon: Gatha Mayer, MD;  [facility];  Service: Endoscopy;  Laterality: N/A;  ? EXCISION OF SKIN TAG Right 11/05/2014  ? Procedure: EXCISION OF RIGHT EYELID SKIN TAG;  Surgeon: [name], MD;  Location: [place];  Service: General;  Laterality: Right;  ? EYE SURGERY Bilateral   ? cataract   ? GASTRIC BYPASS  1977   ? reversed in 1979, [place] CATH AND CORONARY ANGIOGRAPHY N/A 02/17/2020  ? Procedure: LEFT HEART CATH AND CORONARY ANGIOGRAPHY;  Surgeon: Leonie Man, MD;  Location: [place] CV [facility];  Service: Cardiovascular;  Laterality: N/A;  ? LEFT HEART CATHETERIZATION WITH CORONARY ANGIOGRAM N/A 06/29/2014  ? Procedure: LEFT HEART CATHETERIZATION WITH CORONARY ANGIOGRAM;  Surgeon: Troy Sine, MD;  Location: [facility] CATH LAB;  Service: Cardiovascular;  Laterality: N/A;  ?  MEMBRANE PEEL Right 10/23/2018  ? Procedure: MEMBRANE PEEL;  Surgeon: Hurman Horn, MD;  Location: [place];  Service: Ophthalmology;  Laterality: Right;  ? MI with stent placement  2004  ? PARS PLANA VITRECTOMY Right 10/23/2018  ? Procedure: PARS PLANA VITRECTOMY WITH 25 GAUGE;  Surgeon: Hurman Horn, MD;  Location: [place];  Service: Ophthalmology;  Laterality: Right;  ? ? ?Current Medications: ?Current Meds  ?Medication Sig  ? albuterol (PROAIR HFA) 108 (90 Base) MCG/ACT inhaler Inhale 1-2 puffs into the lungs every 6 (six) hours as needed for wheezing or shortness of breath.  ? albuterol (PROVENTIL) (2.5 MG/3ML) 0.083% nebulizer solution Take 3 mLs (2.5 mg total) by nebulization every 6 (six) hours as needed for wheezing or shortness of breath.  ? anastrozole (ARIMIDEX) 1  MG tablet TAKE 1 TABLET BY MOUTH EVERY DAY  ? ASPIRIN LOW DOSE 81 MG EC tablet TAKE 1 TABLET BY MOUTH EVERY DAY  ? atorvastatin (LIPITOR) 80 MG tablet TAKE 1 TABLET BY MOUTH EVERY DAY  ? budesonide-formoterol (SYMBICORT) 160-4.5 MCG/ACT inhaler Inhale 2 puffs into the lungs 2 (two) times daily.  ? carvedilol (COREG) 12.5 MG tablet TAKE 1 TABLET BY MOUTH 2 TIMES DAILY  ? cetirizine (ZYRTEC) 10 MG tablet TAKE 1 TABLET BY MOUTH EVERY DAY  ? clopidogrel (PLAVIX) 75 MG tablet Take 1 tablet (75 mg total) by mouth daily.  ? Continuous Blood Gluc Sensor (FREESTYLE LIBRE 14 DAY SENSOR) MISC PLACE 1 DEVICE ON THE SKIN AS DIRECTED EVERY 14 DAYS  ? diclofenac Sodium (VOLTAREN) 1 % GEL Apply 2 g topically 4 (four) times daily.  ? DULoxetine (CYMBALTA) 60 MG capsule Take one capsule by mouth once daily for anxiety.  ? febuxostat (ULORIC) 40 MG tablet Take 1 tablet (40 mg total) by mouth daily.  ? fluticasone (FLONASE) 50 MCG/ACT nasal spray Place 1 spray into both nostrils in the morning and at bedtime.  ? folic acid (FOLVITE) 1 MG tablet TAKE 1 TABLET BY MOUTH EVERY DAY  ? furosemide (LASIX) 20 MG tablet Take 1 tablet (20 mg total) by mouth daily as needed.  ?  guaiFENesin (MUCINEX) 600 MG 12 hr tablet Take 1 tablet (600 mg total) by mouth 2 (two) times daily as needed.  ? Insulin Aspart (NOVOLOG FLEXPEN RELION [place]) Inject 16 Units into the skin daily.  ? insulin

## 2021-08-24 ENCOUNTER — Encounter (HOSPITAL_COMMUNITY): Payer: Self-pay

## 2021-08-24 ENCOUNTER — Encounter: Payer: Self-pay | Admitting: Family

## 2021-08-24 ENCOUNTER — Ambulatory Visit (INDEPENDENT_AMBULATORY_CARE_PROVIDER_SITE_OTHER): Payer: HMO | Admitting: Family

## 2021-08-24 ENCOUNTER — Encounter: Payer: Self-pay | Admitting: Hematology

## 2021-08-24 VITALS — BP 126/72 | HR 75 | Temp 97.5°F | Resp 20 | Ht 67.0 in | Wt 342.6 lb

## 2021-08-24 DIAGNOSIS — H6122 Impacted cerumen, left ear: Secondary | ICD-10-CM | POA: Diagnosis not present

## 2021-08-24 DIAGNOSIS — R519 Headache, unspecified: Secondary | ICD-10-CM

## 2021-08-24 DIAGNOSIS — R52 Pain, unspecified: Secondary | ICD-10-CM

## 2021-08-24 DIAGNOSIS — R195 Other fecal abnormalities: Secondary | ICD-10-CM

## 2021-08-24 MED ORDER — DEBROX 6.5 % OT SOLN: 5.0000 [drp] | Freq: Two times a day (BID) | OTIC | 0 refills | Status: AC

## 2021-08-24 MED ORDER — TOPIRAMATE 25 MG PO TABS: 25.0000 mg | ORAL_TABLET | Freq: Every day | ORAL | 5 refills | Status: DC

## 2021-08-24 NOTE — Patient Instructions (Signed)
-    Instill debrox 6.5 otic solution 5 drops into left  ear twice daily x 4 days then follow up for ear lavage.May apply cotton ball at bedtime to prevent drainage to pillow.  

## 2021-08-24 NOTE — Progress Notes (Signed)
? ?Provider: Marlowe Sax FNP-C ? ?[name], NP ? ?Patient Care Team: ?[name], NP as PCP - General (Geriatric Medicine) ?[name], MD as PCP - Cardiology (Cardiology) ?Wall, [name], MD (Inactive) as Consulting Physician (Cardiology) ?[name], MD as Consulting Physician (Ophthalmology) ?[name], MD as Consulting Physician (General Surgery) ?[name], MD (Inactive) as Referring Physician (Obstetrics and Gynecology) ?Rankin, [name], MD as Consulting Physician (Ophthalmology) ?Shamleffer, [name], MD as Consulting Physician (Endocrinology) ?[name], MD as Consulting Physician (Hematology) ?[name], DO as Consulting Physician (Neurology) ? ?Extended Emergency Contact Information ?Primary Emergency Contact: Gore (POA),Pamela ?Address: [address] ?         [place], [place] [address] [place] ?Home Phone: [phone] ?Mobile Phone: [phone] ?Relation: Daughter ?Secondary Emergency Contact: Crutchley,Joanne ?         [place], [place] [address] [place] ?Home Phone: [phone] ?Mobile Phone: [phone] ?Relation: Daughter ? ?Code Status:  DNR ?Goals of care: Advanced Directive information ? ?  07/05/2021  ?  3:20 PM  ?Advanced Directives  ?Does Patient Have a Medical Advance Directive? Yes  ?Type of Advance Directive Healthcare Power of Attorney  ?Does patient want to make changes to medical advance directive? No - Patient declined  ?Copy of [place] in Chart? No - copy requested  ? ? ? ?Chief Complaint  ?Patient presents with  ? Acute Visit  ?  Patient complains of loose stool and headache for two weeks. Headaches are all day long everyday  ? ? ?HPI:  ?Pt is a 87 y.o. female seen today for an acute visit for evaluation of loose stool and headache for 2 weeks.Loose stool is associated with some nausea but no vomiting has used mint to help with the nausea.Appetite is good. ?Also states has had  runny nose, watery eyes headache and cough.  Had sore throat but has resolved.  No recent exposure to persons sick with COVID-19.  Has not taken anything for the runny nose. She denies any fever chills or shortness of breath. ? ? ?Past Medical History:  ?Diagnosis Date  ? Allergy   ? takes Mucinex daily as needed  ? Anemia, unspecified   ? Anxiety   ? takes Clonazepam daily as needed  ? Arthritis   ? Breast cancer ([place])   ? Chronic diastolic CHF (congestive heart failure) ([place])   ? takes Furosemide daily  ? CKD (chronic kidney disease), stage III ([place])   ? Coronary artery disease   ? a. s/p IMI 2004 tx with BMS to RCA;  b. s/p Promus DES to RCA 2/12 c. abnormal nuc 2016 -> cath with med rx. d. NSTEMI 02/2020  mv CAD with severe ISR in pRCA and m/dRCA lesion both treated with DES.  ? Dementia ([place])   ? Depression   ? takes Cymbalta daily  ? Diabetes mellitus   ? insulin daily  ? GERD (gastroesophageal reflux disease)   ? takes Protonix daily  ? Gout, unspecified   ? Headache   ? occasionally  ? History of blood clots   ? Hyperlipidemia   ? takes Pravastatin daily  ? Hypertension   ? Insomnia   ? Joint pain   ? Joint swelling   ? Morbid obesity ([place])   ? Myocardial infarction [facility]) 2004  ? Non-STEMI (non-ST elevated myocardial infarction) ([place]) 02/15/2020  ? Numbness   ? Obstructive sleep apnea   ? does not wear cpap  ?  Osteoarthritis   ? Osteoarthritis   ? Osteoarthrosis, unspecified whether generalized or localized, lower leg   ? Pain, chronic   ? Polymyalgia rheumatica ([place])   ? Pulmonary emboli ([place]) 01/2012  ? felt to need lifelong anticoagulation but Xarelto dc in 02/2020 due to need for DAPT  ? Urinary incontinence   ? takes Linzess daily  ? Vertigo   ? hx of;was taking Meclizine if needed  ? Wears dentures   ? ?Past Surgical History:  ?Procedure Laterality Date  ? ABDOMINAL HYSTERECTOMY    ? partial  ? APPENDECTOMY    ? blood clots/legs and lungs  2013  ? BREAST BIOPSY Left 07/22/2014  ? BREAST BIOPSY Left  02/10/2013  ? BREAST LUMPECTOMY Left 11/05/2014  ? BREAST LUMPECTOMY WITH RADIOACTIVE SEED LOCALIZATION Left 11/05/2014  ? Procedure: LEFT BREAST LUMPECTOMY WITH RADIOACTIVE SEED LOCALIZATION;  Surgeon: [name], MD;  Location: [place];  Service: General;  Laterality: Left;  ? CARDIAC CATHETERIZATION    ? COLONOSCOPY    ? CORONARY ANGIOPLASTY  2  ? CORONARY STENT INTERVENTION N/A 02/17/2020  ? Procedure: CORONARY STENT INTERVENTION;  Surgeon: Leonie Man, MD;  Location: [place] CV [facility];  Service: Cardiovascular;  Laterality: N/A;  ? ESOPHAGOGASTRODUODENOSCOPY (EGD) WITH PROPOFOL N/A 11/07/2016  ? Procedure: ESOPHAGOGASTRODUODENOSCOPY (EGD) WITH PROPOFOL;  Surgeon: Gatha Mayer, MD;  [facility];  Service: Endoscopy;  Laterality: N/A;  ? EXCISION OF SKIN TAG Right 11/05/2014  ? Procedure: EXCISION OF RIGHT EYELID SKIN TAG;  Surgeon: [name], MD;  Location: [place];  Service: General;  Laterality: Right;  ? EYE SURGERY Bilateral   ? cataract   ? GASTRIC BYPASS  1977   ? reversed in 1979, [place] CATH AND CORONARY ANGIOGRAPHY N/A 02/17/2020  ? Procedure: LEFT HEART CATH AND CORONARY ANGIOGRAPHY;  Surgeon: Leonie Man, MD;  Location: [place] CV [facility];  Service: Cardiovascular;  Laterality: N/A;  ? LEFT HEART CATHETERIZATION WITH CORONARY ANGIOGRAM N/A 06/29/2014  ? Procedure: LEFT HEART CATHETERIZATION WITH CORONARY ANGIOGRAM;  Surgeon: Troy Sine, MD;  Location: [facility] CATH LAB;  Service: Cardiovascular;  Laterality: N/A;  ? MEMBRANE PEEL Right 10/23/2018  ? Procedure: MEMBRANE PEEL;  Surgeon: Hurman Horn, MD;  Location: [place];  Service: Ophthalmology;  Laterality: Right;  ? MI with stent placement  2004  ? PARS PLANA VITRECTOMY Right 10/23/2018  ? Procedure: PARS PLANA VITRECTOMY WITH 25 GAUGE;  Surgeon: Hurman Horn, MD;  Location: [place];  Service: Ophthalmology;  Laterality: Right;  ? ? ?Allergies  ?Allergen Reactions  ? Sulfonamide Derivatives Swelling  ?   Mouth swelling  ? Aricept [Donepezil Hcl] Diarrhea and Nausea And Vomiting  ?  GI upset/loose stools  ? Tramadol Nausea And Vomiting  ? ? ?Outpatient Encounter Medications as of 08/24/2021  ?Medication Sig  ? albuterol (PROAIR HFA) 108 (90 Base) MCG/ACT inhaler Inhale 1-2 puffs into the lungs every 6 (six) hours as needed for wheezing or shortness of breath.  ? albuterol (PROVENTIL) (2.5 MG/3ML) 0.083% nebulizer solution Take 3 mLs (2.5 mg total) by nebulization every 6 (six) hours as needed for wheezing or shortness of breath.  ? anastrozole (ARIMIDEX) 1 MG tablet TAKE 1 TABLET BY MOUTH EVERY DAY  ? ASPIRIN LOW DOSE 81 MG EC tablet TAKE 1 TABLET BY MOUTH EVERY DAY  ? atorvastatin (LIPITOR) 80 MG tablet TAKE 1 TABLET BY MOUTH EVERY DAY  ? budesonide-formoterol (SYMBICORT) 160-4.5 MCG/ACT inhaler Inhale 2  puffs into the lungs 2 (two) times daily.  ? carvedilol (COREG) 12.5 MG tablet TAKE 1 TABLET BY MOUTH 2 TIMES DAILY  ? cetirizine (ZYRTEC) 10 MG tablet TAKE 1 TABLET BY MOUTH EVERY DAY  ? clopidogrel (PLAVIX) 75 MG tablet Take 1 tablet (75 mg total) by mouth daily.  ? Continuous Blood Gluc Sensor (FREESTYLE LIBRE 14 DAY SENSOR) MISC PLACE 1 DEVICE ON THE SKIN AS DIRECTED EVERY 14 DAYS  ? diclofenac Sodium (VOLTAREN) 1 % GEL Apply 2 g topically 4 (four) times daily.  ? DULoxetine (CYMBALTA) 60 MG capsule Take one capsule by mouth once daily for anxiety.  ? febuxostat (ULORIC) 40 MG tablet Take 1 tablet (40 mg total) by mouth daily.  ? fluticasone (FLONASE) 50 MCG/ACT nasal spray Place 1 spray into both nostrils in the morning and at bedtime.  ? folic acid (FOLVITE) 1 MG tablet TAKE 1 TABLET BY MOUTH EVERY DAY  ? furosemide (LASIX) 20 MG tablet Take 1 tablet (20 mg total) by mouth daily as needed.  ? guaiFENesin (MUCINEX) 600 MG 12 hr tablet Take 1 tablet (600 mg total) by mouth 2 (two) times daily as needed.  ? Insulin Aspart (NOVOLOG FLEXPEN RELION ) Inject 16 Units into the skin daily.  ? insulin glargine, 2 Unit  Dial, (TOUJEO MAX SOLOSTAR) 300 UNIT/ML Solostar Pen Inject 42 Units into the skin daily. Patient assistance program provides  ? isosorbide mononitrate (IMDUR) 60 MG 24 hr tablet Take 1 tablet (60 mg tota

## 2021-08-25 LAB — BASIC METABOLIC PANEL WITH GFR
BUN/Creatinine Ratio: 23 (calc) — ABNORMAL HIGH (ref 6–22)
BUN: 37 mg/dL — ABNORMAL HIGH (ref 7–25)
CO2: 24 mmol/L (ref 20–32)
Calcium: 8.7 mg/dL (ref 8.6–10.4)
Chloride: 108 mmol/L (ref 98–110)
Creat: 1.6 mg/dL — ABNORMAL HIGH (ref 0.60–0.95)
Glucose, Bld: 172 mg/dL — ABNORMAL HIGH (ref 65–99)
Potassium: 5.5 mmol/L — ABNORMAL HIGH (ref 3.5–5.3)
Sodium: 139 mmol/L (ref 135–146)
eGFR: 32 mL/min/{1.73_m2} — ABNORMAL LOW (ref >=60)

## 2021-08-25 LAB — CBC WITH DIFFERENTIAL/PLATELET
Absolute Monocytes: 798 cells/uL (ref 200–950)
Basophils Absolute: 27 cells/uL (ref 0–200)
Basophils Relative: 0.2 %
Eosinophils Absolute: 266 cells/uL (ref 15–500)
Eosinophils Relative: 2 %
HCT: 30.2 % — ABNORMAL LOW (ref 35.0–45.0)
Hemoglobin: 9.6 g/dL — ABNORMAL LOW (ref 11.7–15.5)
Lymphs Abs: 2594 cells/uL (ref 850–3900)
MCH: 29.2 pg (ref 27.0–33.0)
MCHC: 31.8 g/dL — ABNORMAL LOW (ref 32.0–36.0)
MCV: 91.8 fL (ref 80.0–100.0)
MPV: 11.2 fL (ref 7.5–12.5)
Monocytes Relative: 6 %
Neutro Abs: 9616 cells/uL — ABNORMAL HIGH (ref 1500–7800)
Neutrophils Relative %: 72.3 %
Platelets: 283 10*3/uL (ref 140–400)
RBC: 3.29 10*6/uL — ABNORMAL LOW (ref 3.80–5.10)
RDW: 12.6 % (ref 11.0–15.0)
Total Lymphocyte: 19.5 %
WBC: 13.3 10*3/uL — ABNORMAL HIGH (ref 3.8–10.8)

## 2021-08-25 LAB — SARS-COV-2 RNA,(COVID-19) QUALITATIVE NAAT: SARS CoV2 RNA: NOT DETECTED

## 2021-09-05 ENCOUNTER — Ambulatory Visit (INDEPENDENT_AMBULATORY_CARE_PROVIDER_SITE_OTHER): Payer: HMO | Admitting: Nurse Practitioner

## 2021-09-05 ENCOUNTER — Encounter: Payer: Self-pay | Admitting: Nurse Practitioner

## 2021-09-05 VITALS — BP 130/80 | HR 82 | Temp 97.2°F | Ht 67.0 in

## 2021-09-05 DIAGNOSIS — G629 Polyneuropathy, unspecified: Secondary | ICD-10-CM | POA: Diagnosis not present

## 2021-09-05 DIAGNOSIS — K5903 Drug induced constipation: Secondary | ICD-10-CM

## 2021-09-05 DIAGNOSIS — I25118 Atherosclerotic heart disease of native coronary artery with other forms of angina pectoris: Secondary | ICD-10-CM | POA: Diagnosis not present

## 2021-09-05 DIAGNOSIS — R42 Dizziness and giddiness: Secondary | ICD-10-CM

## 2021-09-05 DIAGNOSIS — I10 Essential (primary) hypertension: Secondary | ICD-10-CM

## 2021-09-05 DIAGNOSIS — R519 Headache, unspecified: Secondary | ICD-10-CM

## 2021-09-05 DIAGNOSIS — E875 Hyperkalemia: Secondary | ICD-10-CM | POA: Diagnosis not present

## 2021-09-05 DIAGNOSIS — M159 Polyosteoarthritis, unspecified: Secondary | ICD-10-CM

## 2021-09-05 MED ORDER — LINACLOTIDE 145 MCG PO CAPS: 145.0000 ug | ORAL_CAPSULE | Freq: Every day | ORAL | 2 refills | Status: DC

## 2021-09-05 MED ORDER — MECLIZINE HCL 25 MG PO TABS: 25.0000 mg | ORAL_TABLET | Freq: Three times a day (TID) | ORAL | 0 refills | Status: DC | PRN

## 2021-09-05 MED ORDER — DICLOFENAC SODIUM 1 % EX GEL: 2.0000 g | Freq: Four times a day (QID) | CUTANEOUS | 2 refills | Status: DC

## 2021-09-05 MED ORDER — POLYETHYLENE GLYCOL 3350 17 GM/SCOOP PO POWD: ORAL | 0 refills | Status: DC

## 2021-09-05 NOTE — Patient Instructions (Addendum)
STOP lyrica ?Decrease topamax to every other day for 1 week then STOP ?Please let pharmacy know that you have stopped lyrica and topamax so they will not continue to be in your pill packs ? ?Make sure you are taking symbicort 2 puffs twice daily for your breathing  ? ?To restart linzess to control constipation- take this daily - if needed can take miralax as needed constipation  ? ?Refill sent to pharmacy for meclizine to take three times daily as needed vertigo.  ?

## 2021-09-05 NOTE — Progress Notes (Signed)
? ? ?Careteam: ?Patient Care Team: ?[name], NP as PCP - General (Geriatric Medicine) ?[name], MD as PCP - Cardiology (Cardiology) ?Wall, [name], MD (Inactive) as Consulting Physician (Cardiology) ?[name], MD as Consulting Physician (Ophthalmology) ?[name], MD as Consulting Physician (General Surgery) ?[name], MD (Inactive) as Referring Physician (Obstetrics and Gynecology) ?Rankin, [name], MD as Consulting Physician (Ophthalmology) ?Shamleffer, [name], MD as Consulting Physician (Endocrinology) ?[name], MD as Consulting Physician (Hematology) ?[name], DO as Consulting Physician (Neurology) ? ?PLACE OF SERVICE:  ?[facility]  ?Advanced Directive information ?Does Patient Have a Medical Advance Directive?: Yes, Does patient want to make changes to medical advance directive?: No - Patient declined ? ?Allergies  ?Allergen Reactions  ? Sulfonamide Derivatives Swelling  ?  Mouth swelling  ? Aricept [Donepezil Hcl] Diarrhea and Nausea And Vomiting  ?  GI upset/loose stools  ? Tramadol Nausea And Vomiting  ? ? ?Chief Complaint  ?Patient presents with  ? Medical Management of Chronic Issues  ?  3 month follow up.Patient would like to discuss multiple concerns.Needs to discuss bone density testing.Patient needs ears checked. Patient has pain in hands and all over. Patient hasn't felt well since last week.  ? Immunizations  ?  Shingrix vaccine, tetanus/tdap vaccine and 2nd COVID booster due.  ? Health Maintenance  ?  Eye exam, urine microalbumin,  ? ? ? ?HPI: Patient is a 87 y.o. female for routine follow up.  ? ?She is seeing nephrologist this month to follow up. Potassium level was elevated on last lab, gave [facility] and she reports it made her worse but she did take it.  (Then she later said she never took this medication).  ?She is on sodium polystyrene weekly but reports she had not been taking- has recently started back ? ?She is still taking  lyrica- discussed stopping due to lack of therapeutic effect but still has Rx. Continues with neuropathy to hands and feet.  ? ?Reports she feels a lot of pressure in her chest. No ingestion. She does not do well with meat. No chest pains or  worsening shortness of breath. Only taking symbicort once daily  ? ?She was having a lot of dizziness but now taking her meclizine and that helps her head and headaches.  ? ?She reports she is not having migraines but having vertigo.  ? ?Constipation- not taking linzess or miralax routinely and having trouble with moving her bowel. ? ?She has a lot of confusion with medication, daughter called during visit.  ? ?Review of Systems:  ?Review of Systems  ?Constitutional:  Positive for malaise/fatigue. Negative for chills, fever and weight loss.  ?HENT:  Negative for tinnitus.   ?Respiratory:  Negative for cough, sputum production and shortness of breath.   ?Cardiovascular:  Negative for chest pain, palpitations and leg swelling.  ?Gastrointestinal:  Negative for abdominal pain, constipation, diarrhea and heartburn.  ?Genitourinary:  Negative for dysuria, frequency and urgency.  ?Musculoskeletal:  Positive for back pain and myalgias. Negative for falls and joint pain.  ?Skin: Negative.   ?Neurological:  Positive for dizziness and tingling. Negative for headaches.  ?Psychiatric/Behavioral:  Positive for memory loss. Negative for depression. The patient does not have insomnia.   ? ?Past Medical History:  ?Diagnosis Date  ? Allergy   ? takes Mucinex daily as needed  ? Anemia, unspecified   ? Anxiety   ? takes Clonazepam daily as needed  ? Arthritis   ? Breast cancer (  [place])   ? Chronic diastolic CHF (congestive heart failure) ([place])   ? takes Furosemide daily  ? CKD (chronic kidney disease), stage III ([place])   ? Coronary artery disease   ? a. s/p IMI 2004 tx with BMS to RCA;  b. s/p Promus DES to RCA 2/12 c. abnormal nuc 2016 -> cath with med rx. d. NSTEMI 02/2020  mv CAD with severe ISR  in pRCA and m/dRCA lesion both treated with DES.  ? Dementia ([place])   ? Depression   ? takes Cymbalta daily  ? Diabetes mellitus   ? insulin daily  ? GERD (gastroesophageal reflux disease)   ? takes Protonix daily  ? Gout, unspecified   ? Headache   ? occasionally  ? History of blood clots   ? Hyperlipidemia   ? takes Pravastatin daily  ? Hypertension   ? Insomnia   ? Joint pain   ? Joint swelling   ? Morbid obesity ([place])   ? Myocardial infarction [facility]) 2004  ? Non-STEMI (non-ST elevated myocardial infarction) ([place]) 02/15/2020  ? Numbness   ? Obstructive sleep apnea   ? does not wear cpap  ? Osteoarthritis   ? Osteoarthritis   ? Osteoarthrosis, unspecified whether generalized or localized, lower leg   ? Pain, chronic   ? Polymyalgia rheumatica ([place])   ? Pulmonary emboli ([place]) 01/2012  ? felt to need lifelong anticoagulation but Xarelto dc in 02/2020 due to need for DAPT  ? Urinary incontinence   ? takes Linzess daily  ? Vertigo   ? hx of;was taking Meclizine if needed  ? Wears dentures   ? ?Past Surgical History:  ?Procedure Laterality Date  ? ABDOMINAL HYSTERECTOMY    ? partial  ? APPENDECTOMY    ? blood clots/legs and lungs  2013  ? BREAST BIOPSY Left 07/22/2014  ? BREAST BIOPSY Left 02/10/2013  ? BREAST LUMPECTOMY Left 11/05/2014  ? BREAST LUMPECTOMY WITH RADIOACTIVE SEED LOCALIZATION Left 11/05/2014  ? Procedure: LEFT BREAST LUMPECTOMY WITH RADIOACTIVE SEED LOCALIZATION;  Surgeon: [name], MD;  Location: [place];  Service: General;  Laterality: Left;  ? CARDIAC CATHETERIZATION    ? COLONOSCOPY    ? CORONARY ANGIOPLASTY  2  ? CORONARY STENT INTERVENTION N/A 02/17/2020  ? Procedure: CORONARY STENT INTERVENTION;  Surgeon: Leonie Man, MD;  Location: [place] [facility];  Service: Cardiovascular;  Laterality: N/A;  ? ESOPHAGOGASTRODUODENOSCOPY (EGD) WITH PROPOFOL N/A 11/07/2016  ? Procedure: ESOPHAGOGASTRODUODENOSCOPY (EGD) WITH PROPOFOL;  Surgeon: Gatha Mayer, MD;  [facility];  Service:  Endoscopy;  Laterality: N/A;  ? EXCISION OF SKIN TAG Right 11/05/2014  ? Procedure: EXCISION OF RIGHT EYELID SKIN TAG;  Surgeon: [name], MD;  Location: [place];  Service: General;  Laterality: Right;  ? EYE SURGERY Bilateral   ? cataract   ? GASTRIC BYPASS  1977   ? reversed in 1979, [place] CATH AND CORONARY ANGIOGRAPHY N/A 02/17/2020  ? Procedure: LEFT HEART CATH AND CORONARY ANGIOGRAPHY;  Surgeon: Leonie Man, MD;  Location: [place] [facility];  Service: Cardiovascular;  Laterality: N/A;  ? LEFT HEART CATHETERIZATION WITH CORONARY ANGIOGRAM N/A 06/29/2014  ? Procedure: LEFT HEART CATHETERIZATION WITH CORONARY ANGIOGRAM;  Surgeon: Troy Sine, MD;  Location: [facility] CATH LAB;  Service: Cardiovascular;  Laterality: N/A;  ? MEMBRANE PEEL Right 10/23/2018  ? Procedure: MEMBRANE PEEL;  Surgeon: Hurman Horn, MD;  Location: [place];  Service: Ophthalmology;  Laterality: Right;  ?  MI with stent placement  2004  ? PARS PLANA VITRECTOMY Right 10/23/2018  ? Procedure: PARS PLANA VITRECTOMY WITH 25 GAUGE;  Surgeon: Hurman Horn, MD;  Location: [place];  Service: Ophthalmology;  Laterality: Right;  ? ?Social History: ?  reports that she has never smoked. She has been exposed to tobacco smoke. She has never used smokeless tobacco. She reports that she does not drink alcohol and does not use drugs. ? ?Family History  ?Problem Relation Age of Onset  ? Breast cancer Mother 53  ? Heart disease Mother   ? Throat cancer Father   ? Hypertension Father   ? Arthritis Father   ? Diabetes Father   ? Arthritis Sister   ? Obesity Sister   ? Diabetes Sister   ? Heart disease Cousin   ? Colon cancer Neg Hx   ? Stomach cancer Neg Hx   ? Esophageal cancer Neg Hx   ? ? ?Medications: ?Patient's Medications  ?New Prescriptions  ? No medications on file  ?Previous Medications  ? ALBUTEROL (PROAIR HFA) 108 (90 BASE) MCG/ACT INHALER    Inhale 1-2 puffs into the lungs every 6 (six) hours as needed for wheezing or shortness  of breath.  ? ALBUTEROL (PROVENTIL) (2.5 MG/3ML) 0.083% NEBULIZER SOLUTION    Take 3 mLs (2.5 mg total) by nebulization every 6 (six) hours as needed for wheezing or shortness of breath.  ? ANASTROZOLE (ARI

## 2021-09-08 ENCOUNTER — Other Ambulatory Visit: Payer: Self-pay | Admitting: Nurse Practitioner

## 2021-09-08 ENCOUNTER — Other Ambulatory Visit: Payer: Self-pay | Admitting: Hematology

## 2021-09-09 ENCOUNTER — Other Ambulatory Visit: Payer: Self-pay

## 2021-09-09 ENCOUNTER — Inpatient Hospital Stay: Payer: PPO | Attending: Hematology

## 2021-09-09 ENCOUNTER — Inpatient Hospital Stay (HOSPITAL_BASED_OUTPATIENT_CLINIC_OR_DEPARTMENT_OTHER): Payer: PPO | Admitting: Hematology

## 2021-09-09 ENCOUNTER — Encounter (HOSPITAL_COMMUNITY): Payer: Self-pay

## 2021-09-09 ENCOUNTER — Encounter: Payer: Self-pay | Admitting: Hematology

## 2021-09-09 VITALS — BP 137/84 | HR 81 | Temp 97.5°F | Resp 18

## 2021-09-09 DIAGNOSIS — E559 Vitamin D deficiency, unspecified: Secondary | ICD-10-CM | POA: Diagnosis not present

## 2021-09-09 DIAGNOSIS — N183 Chronic kidney disease, stage 3 unspecified: Secondary | ICD-10-CM | POA: Insufficient documentation

## 2021-09-09 DIAGNOSIS — C50812 Malignant neoplasm of overlapping sites of left female breast: Secondary | ICD-10-CM | POA: Diagnosis not present

## 2021-09-09 DIAGNOSIS — Z170 Estrogen receptor positive status [ER+]: Secondary | ICD-10-CM | POA: Insufficient documentation

## 2021-09-09 DIAGNOSIS — D631 Anemia in chronic kidney disease: Secondary | ICD-10-CM | POA: Diagnosis not present

## 2021-09-09 DIAGNOSIS — E611 Iron deficiency: Secondary | ICD-10-CM | POA: Insufficient documentation

## 2021-09-09 DIAGNOSIS — Z79811 Long term (current) use of aromatase inhibitors: Secondary | ICD-10-CM | POA: Insufficient documentation

## 2021-09-09 DIAGNOSIS — D509 Iron deficiency anemia, unspecified: Secondary | ICD-10-CM | POA: Diagnosis not present

## 2021-09-09 LAB — CMP (CANCER CENTER ONLY)
ALT: 8 U/L (ref 0–44)
AST: 9 U/L — ABNORMAL LOW (ref 15–41)
Albumin: 3.2 g/dL — ABNORMAL LOW (ref 3.5–5.0)
Alkaline Phosphatase: 97 U/L (ref 38–126)
Anion gap: 8 (ref 5–15)
BUN: 43 mg/dL — ABNORMAL HIGH (ref 8–23)
CO2: 24 mmol/L (ref 22–32)
Calcium: 8.7 mg/dL — ABNORMAL LOW (ref 8.9–10.3)
Chloride: 108 mmol/L (ref 98–111)
Creatinine: 1.69 mg/dL — ABNORMAL HIGH (ref 0.44–1.00)
GFR, Estimated: 30 mL/min — ABNORMAL LOW (ref >60)
Glucose, Bld: 178 mg/dL — ABNORMAL HIGH (ref 70–99)
Potassium: 4.3 mmol/L (ref 3.5–5.1)
Sodium: 140 mmol/L (ref 135–145)
Total Bilirubin: 0.3 mg/dL (ref 0.3–1.2)
Total Protein: 7.6 g/dL (ref 6.5–8.1)

## 2021-09-09 LAB — CBC WITH DIFFERENTIAL/PLATELET
Abs Immature Granulocytes: 0.06 10*3/uL (ref 0.00–0.07)
Basophils Absolute: 0 10*3/uL (ref 0.0–0.1)
Basophils Relative: 0 %
Eosinophils Absolute: 0.3 10*3/uL (ref 0.0–0.5)
Eosinophils Relative: 2 %
HCT: 29.3 % — ABNORMAL LOW (ref 36.0–46.0)
Hemoglobin: 8.8 g/dL — ABNORMAL LOW (ref 12.0–15.0)
Immature Granulocytes: 0 %
Lymphocytes Relative: 17 %
Lymphs Abs: 2.4 10*3/uL (ref 0.7–4.0)
MCH: 28.6 pg (ref 26.0–34.0)
MCHC: 30 g/dL (ref 30.0–36.0)
MCV: 95.1 fL (ref 80.0–100.0)
Monocytes Absolute: 0.6 10*3/uL (ref 0.1–1.0)
Monocytes Relative: 4 %
Neutro Abs: 10.8 10*3/uL — ABNORMAL HIGH (ref 1.7–7.7)
Neutrophils Relative %: 77 %
Platelets: 248 10*3/uL (ref 150–400)
RBC: 3.08 MIL/uL — ABNORMAL LOW (ref 3.87–5.11)
RDW: 14.1 % (ref 11.5–15.5)
WBC: 14.2 10*3/uL — ABNORMAL HIGH (ref 4.0–10.5)
nRBC: 0 % (ref 0.0–0.2)

## 2021-09-09 LAB — FERRITIN: Ferritin: 230 ng/mL (ref 11–307)

## 2021-09-09 LAB — IRON AND IRON BINDING CAPACITY (CC-WL,HP ONLY)
Iron: 35 ug/dL (ref 28–170)
Saturation Ratios: 15 % (ref 10.4–31.8)
TIBC: 227 ug/dL — ABNORMAL LOW (ref 250–450)
UIBC: 192 ug/dL (ref 148–442)

## 2021-09-09 LAB — VITAMIN D 25 HYDROXY (VIT D DEFICIENCY, FRACTURES): Vit D, 25-Hydroxy: 64.31 ng/mL (ref 30–100)

## 2021-09-12 ENCOUNTER — Telehealth: Payer: Self-pay | Admitting: Hematology

## 2021-09-12 NOTE — Telephone Encounter (Signed)
Scheduled follow-up appointment per 5/5 los. Patient is aware. ?

## 2021-09-12 NOTE — Telephone Encounter (Signed)
Scheduled follow-up appointments per 5/5 los. Patient is aware. 

## 2021-09-14 ENCOUNTER — Ambulatory Visit (INDEPENDENT_AMBULATORY_CARE_PROVIDER_SITE_OTHER): Payer: HMO | Admitting: Podiatry

## 2021-09-14 ENCOUNTER — Encounter: Payer: Self-pay | Admitting: Podiatry

## 2021-09-14 DIAGNOSIS — E1169 Type 2 diabetes mellitus with other specified complication: Secondary | ICD-10-CM

## 2021-09-14 DIAGNOSIS — N183 Chronic kidney disease, stage 3 unspecified: Secondary | ICD-10-CM | POA: Diagnosis not present

## 2021-09-14 DIAGNOSIS — M21969 Unspecified acquired deformity of unspecified lower leg: Secondary | ICD-10-CM | POA: Diagnosis not present

## 2021-09-14 DIAGNOSIS — B351 Tinea unguium: Secondary | ICD-10-CM | POA: Diagnosis not present

## 2021-09-14 DIAGNOSIS — M79609 Pain in unspecified limb: Secondary | ICD-10-CM

## 2021-09-14 NOTE — Progress Notes (Signed)
This patient returns to my office for at risk foot care.  This patient requires this care by a professional since this patient will be at risk due to having diabetes and CKD. She presents to the office with her daughter in a wheelchair.  This patient is unable to cut nails herself since the patient cannot reach her nails.These nails are painful walking and wearing shoes.  This patient presents for at risk foot care today. ? ?General Appearance  Alert, conversant and in no acute stress. ? ?Vascular  Dorsalis pedis and posterior tibial  pulses are palpable  bilaterally.  Capillary return is within normal limits  bilaterally. Temperature is within normal limits  bilaterally. ? ?Neurologic  Senn-Weinstein monofilament wire test within normal limits  bilaterally. Muscle power within normal limits bilaterally. ? ?Nails Thick disfigured discolored nails with subungual debris  from hallux to fifth toes bilaterally. No evidence of bacterial infection or drainage bilaterally. ? ?Orthopedic  No limitations of motion  feet .  No crepitus or effusions noted.  No bony pathology or digital deformities noted. Deviation of toes 2-5 right foot at the MPJ. ? ?Skin  normotropic skin with no porokeratosis noted bilaterally.  No signs of infections or ulcers noted.    ? ?Onychomycosis  Pain in right toes  Pain in left toes ? ?Consent was obtained for treatment procedures.   Mechanical debridement of nails 1-5  bilaterally performed with a nail nipper.  Filed with dremel without incident.  ? ? ?Return office visit  6  months                   Told patient to return for periodic foot care and evaluation due to potential at risk complications.  ? ? ?[name] DPM  ?

## 2021-09-15 ENCOUNTER — Telehealth: Payer: Self-pay | Admitting: Hematology

## 2021-09-15 ENCOUNTER — Encounter (HOSPITAL_COMMUNITY): Payer: Self-pay

## 2021-09-15 ENCOUNTER — Encounter: Payer: Self-pay | Admitting: Hematology

## 2021-09-15 NOTE — Telephone Encounter (Signed)
Left message with rescheduled tomorrow's appointment per 5/11 secure chat due to changed iron infusion drug. ?

## 2021-09-15 NOTE — Progress Notes (Signed)
. ? ? ? ?Hematology/Oncology Clinic Followup Note ? ?Date of service .09/09/2021 ? ? ? Patient Care Team: ?[name], NP as PCP - General (Geriatric Medicine) ?[name], MD as PCP - Cardiology (Cardiology) ?Wall, [name], MD (Inactive) as Consulting Physician (Cardiology) ?[name], MD as Consulting Physician (Ophthalmology) ?[name], MD as Consulting Physician (General Surgery) ?[name], MD (Inactive) as Referring Physician (Obstetrics and Gynecology) ?Rankin, [name], MD as Consulting Physician (Ophthalmology) ?Shamleffer, [name], MD as Consulting Physician (Endocrinology) ?[name], MD as Consulting Physician (Hematology) ?[name], DO as Consulting Physician (Neurology) ? ?CHIEF COMPLAINTS/PURPOSE OF CONSULTATION:  ?Follow-up for continued evaluation and management of breast cancer ? ?DIAGNOSIS: left-sided presumed stage IA  (pT1c,pNx[cN0], cM0) invasive ductal carcinoma grade 2 out of 3, strongly ER +100%, strongly PR +100% and HER-2/neu negative. Given her high risk cardiac status lumpectomy had to be done under local anesthesia and sentinel lymph node biopsy was not possible. She has accompanying DCIS of intermediate grade present at margins. ?ECOG performance status is 3. ?-Had a mammogram/tomosynthesis done on 07/29/2015 which shows residual suspicious calcification in the left breast postero-medial to the biopsy site. Has known residual DCIS.  No evidence of new malignancy.  No evidence of right breast malignancy. ? ? ?CURRENT TREATMENT: Arimidex 60m po daily ? ?HISTORY OF PRESENTING ILLNESS: -please see my initial consultation for details of initial presentation ? ? ?INTERVAL HISTORY: ?Mrs. CWaddleis here for follow-up of her breast cancer on endocrine therapy with Arimidex for continued evaluation and management. ?She notes significant fatigue which is multifactorial but has also been due to iron deficiency in the past. ?Patient notes that her  previous IV iron infusion certainly helped her energy levels for a bit. ?She notes that she is quite tired with the overall burden of her medical issues. ?No new breast pain, perceivable lumps, nipple discharge or nipple inversion or other breast symptoms. ?Labs done today were reviewed in detail with the patient. ?Patient had a mammogram on 06/21/2021 which showed no mammographic evidence of breast cancer. ? ?Patient denies overt GI bleeding. ? ?MEDICAL HISTORY:  ?Past Medical History:  ?Diagnosis Date  ? Allergy   ? takes Mucinex daily as needed  ? Anemia, unspecified   ? Anxiety   ? takes Clonazepam daily as needed  ? Arthritis   ? Breast cancer ([place]   ? Chronic diastolic CHF (congestive heart failure) ([facility]   ? takes Furosemide daily  ? CKD (chronic kidney disease), stage III ([place]   ? Coronary artery disease   ? a. s/p IMI 2004 tx with BMS to RCA;  b. s/p Promus DES to RCA 2/12 c. abnormal nuc 2016 -> cath with med rx. d. NSTEMI 02/2020  mv CAD with severe ISR in pRCA and m/dRCA lesion both treated with DES.  ? Dementia ([place]   ? Depression   ? takes Cymbalta daily  ? Diabetes mellitus   ? insulin daily  ? GERD (gastroesophageal reflux disease)   ? takes Protonix daily  ? Gout, unspecified   ? Headache   ? occasionally  ? History of blood clots   ? Hyperlipidemia   ? takes Pravastatin daily  ? Hypertension   ? Insomnia   ? Joint pain   ? Joint swelling   ? Morbid obesity ([place]   ? Myocardial infarction ([facility] 2004  ? Non-STEMI (non-ST elevated myocardial infarction) ([place] 02/15/2020  ? Numbness   ? Obstructive sleep apnea   ? does  not wear cpap  ? Osteoarthritis   ? Osteoarthritis   ? Osteoarthrosis, unspecified whether generalized or localized, lower leg   ? Pain, chronic   ? Polymyalgia rheumatica ([place])   ? Pulmonary emboli ([place]) 01/2012  ? felt to need lifelong anticoagulation but Xarelto dc in 02/2020 due to need for DAPT  ? Urinary incontinence   ? takes Linzess daily  ? Vertigo   ? hx of;was taking  Meclizine if needed  ? Wears dentures   ? ? ?SURGICAL HISTORY: ?Past Surgical History:  ?Procedure Laterality Date  ? ABDOMINAL HYSTERECTOMY    ? partial  ? APPENDECTOMY    ? blood clots/legs and lungs  2013  ? BREAST BIOPSY Left 07/22/2014  ? BREAST BIOPSY Left 02/10/2013  ? BREAST LUMPECTOMY Left 11/05/2014  ? BREAST LUMPECTOMY WITH RADIOACTIVE SEED LOCALIZATION Left 11/05/2014  ? Procedure: LEFT BREAST LUMPECTOMY WITH RADIOACTIVE SEED LOCALIZATION;  Surgeon: [name], MD;  Location: [place];  Service: General;  Laterality: Left;  ? CARDIAC CATHETERIZATION    ? COLONOSCOPY    ? CORONARY ANGIOPLASTY  2  ? CORONARY STENT INTERVENTION N/A 02/17/2020  ? Procedure: CORONARY STENT INTERVENTION;  Surgeon: Leonie Man, MD;  Location: [place] CV [facility];  Service: Cardiovascular;  Laterality: N/A;  ? ESOPHAGOGASTRODUODENOSCOPY (EGD) WITH PROPOFOL N/A 11/07/2016  ? Procedure: ESOPHAGOGASTRODUODENOSCOPY (EGD) WITH PROPOFOL;  Surgeon: Gatha Mayer, MD;  [facility];  Service: Endoscopy;  Laterality: N/A;  ? EXCISION OF SKIN TAG Right 11/05/2014  ? Procedure: EXCISION OF RIGHT EYELID SKIN TAG;  Surgeon: [name], MD;  Location: [place];  Service: General;  Laterality: Right;  ? EYE SURGERY Bilateral   ? cataract   ? GASTRIC BYPASS  1977   ? reversed in 1979, [place] CATH AND CORONARY ANGIOGRAPHY N/A 02/17/2020  ? Procedure: LEFT HEART CATH AND CORONARY ANGIOGRAPHY;  Surgeon: Leonie Man, MD;  Location: [place] [facility];  Service: Cardiovascular;  Laterality: N/A;  ? LEFT HEART CATHETERIZATION WITH CORONARY ANGIOGRAM N/A 06/29/2014  ? Procedure: LEFT HEART CATHETERIZATION WITH CORONARY ANGIOGRAM;  Surgeon: Troy Sine, MD;  Location: [facility] CATH LAB;  Service: Cardiovascular;  Laterality: N/A;  ? MEMBRANE PEEL Right 10/23/2018  ? Procedure: MEMBRANE PEEL;  Surgeon: Hurman Horn, MD;  Location: [place];  Service: Ophthalmology;  Laterality: Right;  ? MI with stent placement   2004  ? PARS PLANA VITRECTOMY Right 10/23/2018  ? Procedure: PARS PLANA VITRECTOMY WITH 25 GAUGE;  Surgeon: Hurman Horn, MD;  Location: [place];  Service: Ophthalmology;  Laterality: Right;  ? ? ?SOCIAL HISTORY: ?Social History  ? ?Socioeconomic History  ? Marital status: Widowed  ?  Spouse name: Not on file  ? Number of children: Not on file  ? Years of education: Not on file  ? Highest education level: Not on file  ?Occupational History  ? Occupation: retired  ?Tobacco Use  ? Smoking status: Never  ?  Passive exposure: Past  ? Smokeless tobacco: Never  ? Tobacco comments:  ?  Both parents smoked, patient was exposed/ "Raised up in smoke, my whole life."  ?Vaping Use  ? Vaping Use: Never used  ?Substance and Sexual Activity  ? Alcohol use: No  ?  Alcohol/week: 0.0 standard drinks  ? Drug use: No  ? Sexual activity: Not Currently  ?Other Topics Concern  ? Not on file  ?Social History Narrative  ? Not on file  ? ?Social Determinants  of Health  ? ?Financial Resource Strain: Not on file  ?Food Insecurity: Not on file  ?Transportation Needs: Not on file  ?Physical Activity: Not on file  ?Stress: Not on file  ?Social Connections: Not on file  ?Intimate Partner Violence: Not on file  ? ? ?FAMILY HISTORY: ?Family History  ?Problem Relation Age of Onset  ? Breast cancer Mother 83  ? Heart disease Mother   ? Throat cancer Father   ? Hypertension Father   ? Arthritis Father   ? Diabetes Father   ? Arthritis Sister   ? Obesity Sister   ? Diabetes Sister   ? Heart disease Cousin   ? Colon cancer Neg Hx   ? Stomach cancer Neg Hx   ? Esophageal cancer Neg Hx   ? ? ?ALLERGIES:  is allergic to sulfonamide derivatives, lokelma [sodium zirconium cyclosilicate], aricept [donepezil hcl], and tramadol. ? ?MEDICATIONS:  ?Current Outpatient Medications  ?Medication Sig Dispense Refill  ? albuterol (PROAIR HFA) 108 (90 Base) MCG/ACT inhaler Inhale 1-2 puffs into the lungs every 6 (six) hours as needed for wheezing or shortness of breath.  6.7 g 1  ? albuterol (PROVENTIL) (2.5 MG/3ML) 0.083% nebulizer solution Take 3 mLs (2.5 mg total) by nebulization every 6 (six) hours as needed for wheezing or shortness of breath. 150 mL 1  ? anastrozole (ARIM

## 2021-09-16 ENCOUNTER — Encounter: Payer: Self-pay | Admitting: Hematology

## 2021-09-16 ENCOUNTER — Other Ambulatory Visit: Payer: Self-pay

## 2021-09-16 ENCOUNTER — Inpatient Hospital Stay: Payer: PPO

## 2021-09-16 ENCOUNTER — Encounter (HOSPITAL_COMMUNITY): Payer: Self-pay

## 2021-09-16 VITALS — BP 139/66 | HR 67 | Temp 98.3°F | Resp 18

## 2021-09-16 DIAGNOSIS — E611 Iron deficiency: Secondary | ICD-10-CM | POA: Diagnosis not present

## 2021-09-16 DIAGNOSIS — D509 Iron deficiency anemia, unspecified: Secondary | ICD-10-CM

## 2021-09-16 MED ORDER — SODIUM CHLORIDE 0.9 % IV SOLN: Freq: Once | INTRAVENOUS | Status: AC

## 2021-09-16 MED ORDER — ACETAMINOPHEN 325 MG PO TABS
650.0000 mg | ORAL_TABLET | Freq: Once | ORAL | Status: AC
  Administered 2021-09-16: 650 mg via ORAL
  Filled 2021-09-16: qty 2

## 2021-09-16 MED ORDER — LORATADINE 10 MG PO TABS
10.0000 mg | ORAL_TABLET | Freq: Once | ORAL | Status: AC
  Administered 2021-09-16: 10 mg via ORAL
  Filled 2021-09-16: qty 1

## 2021-09-16 MED ORDER — SODIUM CHLORIDE 0.9 % IV SOLN
300.0000 mg | Freq: Once | INTRAVENOUS | Status: AC
  Administered 2021-09-16: 300 mg via INTRAVENOUS
  Filled 2021-09-16: qty 300

## 2021-09-16 NOTE — Patient Instructions (Signed)

## 2021-09-16 NOTE — Progress Notes (Signed)
Pt declined to stay for 30 minutes post Venofer infusion obs. ?Pt tolerated trtmt well w/out incident. ?VSS at discharge.  ?W/C assist to lobby.   ?

## 2021-09-19 ENCOUNTER — Telehealth: Payer: Self-pay | Admitting: Student in an Organized Health Care Education/Training Program

## 2021-09-19 ENCOUNTER — Other Ambulatory Visit: Payer: Self-pay | Admitting: *Deleted

## 2021-09-19 MED ORDER — OXYCODONE HCL 5 MG PO TABS: 5.0000 mg | ORAL_TABLET | Freq: Two times a day (BID) | ORAL | 0 refills | Status: DC | PRN

## 2021-09-19 NOTE — Telephone Encounter (Signed)
Patient states her [facility] in [place] does not have meds to fill her script? Or does not have her script. Please call her pharmacy. I told patient if they do not have meds to fill script she will have to find a pharmacy that does and call back.  ?

## 2021-09-19 NOTE — Telephone Encounter (Signed)
Pt stated that she found a Pharmacy that has her Oxycodone '5mg'$ . Pt would [facility] [address]. Please call Pt and Pharmacy with an update. ?

## 2021-09-19 NOTE — Telephone Encounter (Signed)
[facility] done and sent to Dr. Holley Raring. [facility] script deleted. ?

## 2021-09-20 ENCOUNTER — Telehealth: Payer: Self-pay

## 2021-09-20 ENCOUNTER — Telehealth (INDEPENDENT_AMBULATORY_CARE_PROVIDER_SITE_OTHER): Payer: HMO | Admitting: Nurse Practitioner

## 2021-09-20 DIAGNOSIS — Z961 Presence of intraocular lens: Secondary | ICD-10-CM | POA: Diagnosis not present

## 2021-09-20 DIAGNOSIS — D229 Melanocytic nevi, unspecified: Secondary | ICD-10-CM

## 2021-09-20 DIAGNOSIS — E1169 Type 2 diabetes mellitus with other specified complication: Secondary | ICD-10-CM | POA: Diagnosis not present

## 2021-09-20 DIAGNOSIS — L299 Pruritus, unspecified: Secondary | ICD-10-CM

## 2021-09-20 DIAGNOSIS — E113393 Type 2 diabetes mellitus with moderate nonproliferative diabetic retinopathy without macular edema, bilateral: Secondary | ICD-10-CM | POA: Diagnosis not present

## 2021-09-20 DIAGNOSIS — H35373 Puckering of macula, bilateral: Secondary | ICD-10-CM | POA: Diagnosis not present

## 2021-09-20 NOTE — Telephone Encounter (Signed)
Ms. zera, markwardt are scheduled for a virtual visit with your provider today.   ? ?Just as we do with appointments in the office, we must obtain your consent to participate.  Your consent will be active for this visit and any virtual visit you may have with one of our providers in the next 365 days.   ? ?If you have a MyChart account, I can also send a copy of this consent to you electronically.  All virtual visits are billed to your insurance company just like a traditional visit in the office.  As this is a virtual visit, video technology does not allow for your provider to perform a traditional examination.  This may limit your provider's ability to fully assess your condition.  If your provider identifies any concerns that need to be evaluated in person or the need to arrange testing such as labs, EKG, etc, we will make arrangements to do so.   ? ?Although advances in technology are sophisticated, we cannot ensure that it will always work on either your end or our end.  If the connection with a video visit is poor, we may have to switch to a telephone visit.  With either a video or telephone visit, we are not always able to ensure that we have a secure connection.   I need to obtain your verbal consent now.   Are you willing to proceed with your visit today?  ? ?KINESHA AUTEN has provided verbal consent on 09/20/2021 for a virtual visit (video or telephone). ? ? ?Carroll Kinds, CMA ?09/20/2021  9:26 AM ?  ?

## 2021-09-20 NOTE — Progress Notes (Signed)
? ? ?Careteam: ?Patient Care Team: ?[name], NP as PCP - General (Geriatric Medicine) ?[name], MD as PCP - Cardiology (Cardiology) ?Wall, [name], MD (Inactive) as Consulting Physician (Cardiology) ?[name], MD as Consulting Physician (Ophthalmology) ?[name], MD as Consulting Physician (General Surgery) ?[name], MD (Inactive) as Referring Physician (Obstetrics and Gynecology) ?Rankin, [name], MD as Consulting Physician (Ophthalmology) ?Shamleffer, [name], MD as Consulting Physician (Endocrinology) ?[name], MD as Consulting Physician (Hematology) ?[name], DO as Consulting Physician (Neurology) ? ?Advanced Directive information ?  ? ?Allergies  ?Allergen Reactions  ? Sulfonamide Derivatives Swelling  ?  Mouth swelling  ? Lokelma [Sodium Zirconium Cyclosilicate]   ?  Headache, stomach upset  ? Aricept [Donepezil Hcl] Diarrhea and Nausea And Vomiting  ?  GI upset/loose stools  ? Tramadol Nausea And Vomiting  ? ? ?Chief Complaint  ?Patient presents with  ? Acute Visit  ?  Patient states that her face is itching real bad and the moles on her face itch. Patient has been having itching for about 2 to 3 weeks. Patient has been using olay on face.   ? ? ? ?HPI: Patient is a 87 y.o. female for moles that are itching.  ?Reports she has moles all over but the ones on her face are very itchy.  ?She is not using anything for itching.  ?Not having a rash, redness.  ? ?Reports she is having bleeding vaginally or from bladder. Reports when she wipes there is blood at times.  ?Review of Systems:  ?Review of Systems  ?Constitutional:  Negative for chills and fever.  ?Gastrointestinal:  Negative for abdominal pain, constipation and diarrhea.  ?Genitourinary:   ?     Reports bleeding when she wipes urine, unsure if it is urine or vaginal.   ?Skin:  Positive for itching. Negative for rash.  ? ?Past Medical History:  ?Diagnosis Date  ? Allergy   ? takes Mucinex  daily as needed  ? Anemia, unspecified   ? Anxiety   ? takes Clonazepam daily as needed  ? Arthritis   ? Breast cancer ([place])   ? Chronic diastolic CHF (congestive heart failure) ([place])   ? takes Furosemide daily  ? CKD (chronic kidney disease), stage III ([place])   ? Coronary artery disease   ? a. s/p IMI 2004 tx with BMS to RCA;  b. s/p Promus DES to RCA 2/12 c. abnormal nuc 2016 -> cath with med rx. d. NSTEMI 02/2020  mv CAD with severe ISR in pRCA and m/dRCA lesion both treated with DES.  ? Dementia ([place])   ? Depression   ? takes Cymbalta daily  ? Diabetes mellitus   ? insulin daily  ? GERD (gastroesophageal reflux disease)   ? takes Protonix daily  ? Gout, unspecified   ? Headache   ? occasionally  ? History of blood clots   ? Hyperlipidemia   ? takes Pravastatin daily  ? Hypertension   ? Insomnia   ? Joint pain   ? Joint swelling   ? Morbid obesity ([place])   ? Myocardial infarction [facility]) 2004  ? Non-STEMI (non-ST elevated myocardial infarction) ([place]) 02/15/2020  ? Numbness   ? Obstructive sleep apnea   ? does not wear cpap  ? Osteoarthritis   ? Osteoarthritis   ? Osteoarthrosis, unspecified whether generalized or localized, lower leg   ? Pain, chronic   ? Polymyalgia rheumatica ([place])   ? Pulmonary emboli ([place]) 01/2012  ?  felt to need lifelong anticoagulation but Xarelto dc in 02/2020 due to need for DAPT  ? Urinary incontinence   ? takes Linzess daily  ? Vertigo   ? hx of;was taking Meclizine if needed  ? Wears dentures   ? ?Past Surgical History:  ?Procedure Laterality Date  ? ABDOMINAL HYSTERECTOMY    ? partial  ? APPENDECTOMY    ? blood clots/legs and lungs  2013  ? BREAST BIOPSY Left 07/22/2014  ? BREAST BIOPSY Left 02/10/2013  ? BREAST LUMPECTOMY Left 11/05/2014  ? BREAST LUMPECTOMY WITH RADIOACTIVE SEED LOCALIZATION Left 11/05/2014  ? Procedure: LEFT BREAST LUMPECTOMY WITH RADIOACTIVE SEED LOCALIZATION;  Surgeon: [name], MD;  Location: [place];  Service: General;  Laterality: Left;  ? CARDIAC  CATHETERIZATION    ? COLONOSCOPY    ? CORONARY ANGIOPLASTY  2  ? CORONARY STENT INTERVENTION N/A 02/17/2020  ? Procedure: CORONARY STENT INTERVENTION;  Surgeon: Leonie Man, MD;  Location: [place] [facility];  Service: Cardiovascular;  Laterality: N/A;  ? ESOPHAGOGASTRODUODENOSCOPY (EGD) WITH PROPOFOL N/A 11/07/2016  ? Procedure: ESOPHAGOGASTRODUODENOSCOPY (EGD) WITH PROPOFOL;  Surgeon: Gatha Mayer, MD;  [facility];  Service: Endoscopy;  Laterality: N/A;  ? EXCISION OF SKIN TAG Right 11/05/2014  ? Procedure: EXCISION OF RIGHT EYELID SKIN TAG;  Surgeon: [name], MD;  Location: [place];  Service: General;  Laterality: Right;  ? EYE SURGERY Bilateral   ? cataract   ? GASTRIC BYPASS  1977   ? reversed in 1979, Regino Ramirez CATH AND CORONARY ANGIOGRAPHY N/A 02/17/2020  ? Procedure: LEFT HEART CATH AND CORONARY ANGIOGRAPHY;  Surgeon: Leonie Man, MD;  Location: [place] [facility];  Service: Cardiovascular;  Laterality: N/A;  ? LEFT HEART CATHETERIZATION WITH CORONARY ANGIOGRAM N/A 06/29/2014  ? Procedure: LEFT HEART CATHETERIZATION WITH CORONARY ANGIOGRAM;  Surgeon: Troy Sine, MD;  Location: [facility] CATH LAB;  Service: Cardiovascular;  Laterality: N/A;  ? MEMBRANE PEEL Right 10/23/2018  ? Procedure: MEMBRANE PEEL;  Surgeon: Hurman Horn, MD;  Location: [place];  Service: Ophthalmology;  Laterality: Right;  ? MI with stent placement  2004  ? PARS PLANA VITRECTOMY Right 10/23/2018  ? Procedure: PARS PLANA VITRECTOMY WITH 25 GAUGE;  Surgeon: Hurman Horn, MD;  Location: [place];  Service: Ophthalmology;  Laterality: Right;  ? ?Social History: ?  reports that she has never smoked. She has been exposed to tobacco smoke. She has never used smokeless tobacco. She reports that she does not drink alcohol and does not use drugs. ? ?Family History  ?Problem Relation Age of Onset  ? Breast cancer Mother 11  ? Heart disease Mother   ? Throat cancer Father   ? Hypertension Father   ? Arthritis  Father   ? Diabetes Father   ? Arthritis Sister   ? Obesity Sister   ? Diabetes Sister   ? Heart disease Cousin   ? Colon cancer Neg Hx   ? Stomach cancer Neg Hx   ? Esophageal cancer Neg Hx   ? ? ?Medications: ?Patient's Medications  ?New Prescriptions  ? No medications on file  ?Previous Medications  ? ALBUTEROL (PROAIR HFA) 108 (90 BASE) MCG/ACT INHALER    Inhale 1-2 puffs into the lungs every 6 (six) hours as needed for wheezing or shortness of breath.  ? ALBUTEROL (PROVENTIL) (2.5 MG/3ML) 0.083% NEBULIZER SOLUTION    Take 3 mLs (2.5 mg total) by nebulization every 6 (six) hours as needed  for wheezing or shortness of breath.  ? ANASTROZOLE (ARIMIDEX) 1 MG TABLET    TAKE 1 TABLET BY MOUTH EVERY DAY  ? ASPIRIN LOW DOSE 81 MG EC TABLET    TAKE 1 TABLET BY MOUTH EVERY DAY  ? ATORVASTATIN (LIPITOR) 80 MG TABLET    TAKE 1 TABLET BY MOUTH EVERY DAY  ? BUDESONIDE-FORMOTEROL (SYMBICORT) 160-4.5 MCG/ACT INHALER    Inhale 2 puffs into the lungs 2 (two) times daily.  ? CARVEDILOL (COREG) 12.5 MG TABLET    TAKE 1 TABLET BY MOUTH 2 TIMES DAILY  ? CETIRIZINE (ZYRTEC) 10 MG TABLET    TAKE 1 TABLET BY MOUTH EVERY DAY  ? CLOPIDOGREL (PLAVIX) 75 MG TABLET    Take 1 tablet (75 mg total) by mouth daily.  ? CONTINUOUS BLOOD GLUC SENSOR (FREESTYLE LIBRE 14 DAY SENSOR) MISC    PLACE 1 DEVICE ON THE SKIN AS DIRECTED EVERY 14 DAYS  ? DICLOFENAC SODIUM (VOLTAREN) 1 % GEL    Apply 2 g topically 4 (four) times daily.  ? DULOXETINE (CYMBALTA) 60 MG CAPSULE    Take one capsule by mouth once daily for anxiety.  ? FEBUXOSTAT (ULORIC) 40 MG TABLET    Take 1 tablet (40 mg total) by mouth daily.  ? FLUTICASONE (FLONASE) 50 MCG/ACT NASAL SPRAY    Place 1 spray into both nostrils in the morning and at bedtime.  ? FOLIC ACID (FOLVITE) 1 MG TABLET    TAKE 1 TABLET BY MOUTH EVERY DAY  ? FUROSEMIDE (LASIX) 20 MG TABLET    TAKE 1 TABLET BY MOUTH EVERY DAY AS NEEDED  ? GUAIFENESIN (MUCINEX) 600 MG 12 HR TABLET    Take 1 tablet (600 mg total) by mouth 2  (two) times daily as needed.  ? INSULIN ASPART (NOVOLOG FLEXPEN RELION [place])    Inject 16 Units into the skin daily.  ? INSULIN GLARGINE, 2 UNIT DIAL, (TOUJEO MAX SOLOSTAR) 300 UNIT/ML SOLOSTAR PEN    Inject 42 Units

## 2021-09-20 NOTE — Progress Notes (Signed)
This service is provided via telemedicine ? ?No vital signs collected/recorded due to the encounter was a telemedicine visit.  ? ?Location of patient (ex: home, work):  Home ? ?Patient consents to a telephone visit:  Yes, see encounter dated 09/20/2021 ? ?Location of the provider (ex: office, home):  [place] ? ?Name of any referring provider:  N/A ? ?Names of all persons participating in the telemedicine service and their role in the encounter:  [name], Nurse Practitioner, Carroll Kinds, CMA, patient and daughter [name]. ? ?Time spent on call:  8 minutes with medical assistant ? ?

## 2021-09-21 DIAGNOSIS — N183 Chronic kidney disease, stage 3 unspecified: Secondary | ICD-10-CM | POA: Diagnosis not present

## 2021-09-21 DIAGNOSIS — E1122 Type 2 diabetes mellitus with diabetic chronic kidney disease: Secondary | ICD-10-CM | POA: Diagnosis not present

## 2021-09-21 DIAGNOSIS — I2510 Atherosclerotic heart disease of native coronary artery without angina pectoris: Secondary | ICD-10-CM | POA: Diagnosis not present

## 2021-09-21 DIAGNOSIS — N2581 Secondary hyperparathyroidism of renal origin: Secondary | ICD-10-CM | POA: Diagnosis not present

## 2021-09-21 DIAGNOSIS — D631 Anemia in chronic kidney disease: Secondary | ICD-10-CM | POA: Diagnosis not present

## 2021-09-21 DIAGNOSIS — I129 Hypertensive chronic kidney disease with stage 1 through stage 4 chronic kidney disease, or unspecified chronic kidney disease: Secondary | ICD-10-CM | POA: Diagnosis not present

## 2021-09-22 ENCOUNTER — Telehealth: Payer: Self-pay | Admitting: Dermatology

## 2021-09-22 NOTE — Telephone Encounter (Signed)
Patient's daughter is calling for a referral appointment from [name], NP.  Patient does not want to wait until January 2024 for an appointment so would like referral sent back to [name], NP's office.

## 2021-09-22 NOTE — Telephone Encounter (Signed)
Notes documented and referral routed back to referring office. 

## 2021-09-23 ENCOUNTER — Other Ambulatory Visit: Payer: Self-pay

## 2021-09-23 ENCOUNTER — Inpatient Hospital Stay: Payer: PPO

## 2021-09-23 VITALS — BP 145/65 | HR 73 | Temp 98.1°F | Resp 18

## 2021-09-23 DIAGNOSIS — E611 Iron deficiency: Secondary | ICD-10-CM | POA: Diagnosis not present

## 2021-09-23 DIAGNOSIS — D509 Iron deficiency anemia, unspecified: Secondary | ICD-10-CM

## 2021-09-23 MED ORDER — SODIUM CHLORIDE 0.9 % IV SOLN: Freq: Once | INTRAVENOUS | Status: AC

## 2021-09-23 MED ORDER — ACETAMINOPHEN 325 MG PO TABS
650.0000 mg | ORAL_TABLET | Freq: Once | ORAL | Status: AC
  Administered 2021-09-23: 650 mg via ORAL
  Filled 2021-09-23: qty 2

## 2021-09-23 MED ORDER — SODIUM CHLORIDE 0.9 % IV SOLN
300.0000 mg | Freq: Once | INTRAVENOUS | Status: AC
  Administered 2021-09-23: 300 mg via INTRAVENOUS
  Filled 2021-09-23: qty 300

## 2021-09-23 MED ORDER — LORATADINE 10 MG PO TABS
10.0000 mg | ORAL_TABLET | Freq: Once | ORAL | Status: AC
  Administered 2021-09-23: 10 mg via ORAL
  Filled 2021-09-23: qty 1

## 2021-09-23 NOTE — Patient Instructions (Signed)

## 2021-09-23 NOTE — Progress Notes (Signed)
Pt declined to stay for 30 minutes post Venofer infusion obs. Pt tolerated trtmt well w/out incident. VSS at discharge.  W/C assist to lobby.

## 2021-09-26 ENCOUNTER — Ambulatory Visit: Payer: PPO | Admitting: Internal Medicine

## 2021-09-26 ENCOUNTER — Other Ambulatory Visit: Payer: Self-pay | Admitting: Nurse Practitioner

## 2021-09-26 DIAGNOSIS — R413 Other amnesia: Secondary | ICD-10-CM

## 2021-10-06 ENCOUNTER — Telehealth (INDEPENDENT_AMBULATORY_CARE_PROVIDER_SITE_OTHER): Payer: HMO | Admitting: Nurse Practitioner

## 2021-10-06 DIAGNOSIS — K5903 Drug induced constipation: Secondary | ICD-10-CM | POA: Diagnosis not present

## 2021-10-06 NOTE — Progress Notes (Signed)
Careteam: Patient Care Team: Nichole Chandler, NP as PCP - General (Geriatric Medicine) [name], MD as PCP - Cardiology (Cardiology) [name], [name], MD (Inactive) as Consulting Physician (Cardiology) [name], MD as Consulting Physician (Ophthalmology) [name], MD as Consulting Physician (General Surgery) [name], MD (Inactive) as Referring Physician (Obstetrics and Gynecology) [name] [name], MD as Consulting Physician (Ophthalmology) [facility], [name], MD as Consulting Physician (Endocrinology) [name], MD as Consulting Physician (Hematology) [name], DO as Consulting Physician (Neurology)  Advanced Directive information    Allergies  Allergen Reactions   Sulfonamide Derivatives Swelling    Mouth swelling   Lokelma [Sodium Zirconium Cyclosilicate]     Headache, stomach upset   Aricept [Donepezil Hcl] Diarrhea and Nausea And Vomiting    GI upset/loose stools   Tramadol Nausea And Vomiting    Chief Complaint  Patient presents with   Acute Visit    Patient presents today for abdominal pain and constipation for 2-3 months now. She reports taking Linzess 145 MG but doesn't take it every day.     HPI: Patient is a 87 y.o. female due to constipation.  Reports her stomach is shaking on the inside. Reports it is only when she has a BM.  Reports she takes 5-6 days to have a BM.  She has a Rx for linzess but does not take it daily- takes every 3 days.  She is not taking her miralax  She says she does not take linzess daily because "it makes her not feel well" can not go into detail on why she doesn't feel well.  Reports increase stress.   Review of Systems:  Review of Systems  Constitutional:  Negative for chills, fever, malaise/fatigue and weight loss.  Gastrointestinal:  Positive for constipation. Negative for abdominal pain, diarrhea and heartburn.   Past Medical History:  Diagnosis Date   Allergy    takes  Mucinex daily as needed   Anemia, unspecified    Anxiety    takes Clonazepam daily as needed   Arthritis    Breast cancer ([place])    Chronic diastolic CHF (congestive heart failure) (HCC)    takes Furosemide daily   CKD (chronic kidney disease), stage III ([place])    Coronary artery disease    a. s/p IMI 2004 tx with BMS to RCA;  b. s/p Promus DES to RCA 2/12 c. abnormal nuc 2016 -> cath with med rx. d. NSTEMI 02/2020  mv CAD with severe ISR in pRCA and m/dRCA lesion both treated with DES.   Dementia ([place])    Depression    takes Cymbalta daily   Diabetes mellitus    insulin daily   GERD (gastroesophageal reflux disease)    takes Protonix daily   Gout, unspecified    Headache    occasionally   History of blood clots    Hyperlipidemia    takes Pravastatin daily   Hypertension    Insomnia    Joint pain    Joint swelling    Morbid obesity ([place])    Myocardial infarction ([place]) 2004   Non-STEMI (non-ST elevated myocardial infarction) ([place]) 02/15/2020   Numbness    Obstructive sleep apnea    does not wear cpap   Osteoarthritis    Osteoarthritis    Osteoarthrosis, unspecified whether generalized or localized, lower leg    Pain, chronic    Polymyalgia rheumatica ([place])    Pulmonary emboli ([place]) 01/2012   felt to need lifelong  anticoagulation but Xarelto dc in 02/2020 due to need for DAPT   Urinary incontinence    takes Linzess daily   Vertigo    hx of;was taking Meclizine if needed   Wears dentures    Past Surgical History:  Procedure Laterality Date   ABDOMINAL HYSTERECTOMY     partial   APPENDECTOMY     blood clots/legs and lungs  2013   BREAST BIOPSY Left 07/22/2014   BREAST BIOPSY Left 02/10/2013   BREAST LUMPECTOMY Left 11/05/2014   BREAST LUMPECTOMY WITH RADIOACTIVE SEED LOCALIZATION Left 11/05/2014   Procedure: LEFT BREAST LUMPECTOMY WITH RADIOACTIVE SEED LOCALIZATION;  Surgeon: [name], MD;  Location: [facility];  Service: General;  Laterality: Left;   CARDIAC  CATHETERIZATION     COLONOSCOPY     CORONARY ANGIOPLASTY  2   CORONARY STENT INTERVENTION N/A 02/17/2020   Procedure: CORONARY STENT INTERVENTION;  Surgeon: Leonie Man, MD;  Location: [facility];  Service: Cardiovascular;  Laterality: N/A;   ESOPHAGOGASTRODUODENOSCOPY (EGD) WITH PROPOFOL N/A 11/07/2016   Procedure: ESOPHAGOGASTRODUODENOSCOPY (EGD) WITH PROPOFOL;  Surgeon: Gatha Mayer, MD;  Location: [facility];  Service: Endoscopy;  Laterality: N/A;   EXCISION OF SKIN TAG Right 11/05/2014   Procedure: EXCISION OF RIGHT EYELID SKIN TAG;  Surgeon: [name], MD;  Location: [place];  Service: General;  Laterality: Right;   EYE SURGERY Bilateral    cataract    GASTRIC BYPASS  1977    reversed in 1979, [place] CATH AND CORONARY ANGIOGRAPHY N/A 02/17/2020   Procedure: LEFT HEART CATH AND CORONARY ANGIOGRAPHY;  Surgeon: Leonie Man, MD;  Location: [facility];  Service: Cardiovascular;  Laterality: N/A;   LEFT HEART CATHETERIZATION WITH CORONARY ANGIOGRAM N/A 06/29/2014   Procedure: LEFT HEART CATHETERIZATION WITH CORONARY ANGIOGRAM;  Surgeon: Troy Sine, MD;  Location: [facility] C[facility] LAB;  Service: Cardiovascular;  Laterality: N/A;   MEMBRANE PEEL Right 10/23/2018   Procedure: MEMBRANE PEEL;  Surgeon: Hurman Horn, MD;  Location: [facility];  Service: Ophthalmology;  Laterality: Right;   MI with stent placement  2004   PARS PLANA VITRECTOMY Right 10/23/2018   Procedure: PARS PLANA VITRECTOMY WITH 25 GAUGE;  Surgeon: Hurman Horn, MD;  Location: [facility];  Service: Ophthalmology;  Laterality: Right;   Social History:   reports that she has never smoked. She has been exposed to tobacco smoke. She has never used smokeless tobacco. She reports that she does not drink alcohol and does not use drugs.  Family History  Problem Relation Age of Onset   Breast cancer Mother 61   Heart disease Mother    Throat cancer Father    Hypertension Father    Arthritis  Father    Diabetes Father    Arthritis Sister    Obesity Sister    Diabetes Sister    Heart disease Cousin    Colon cancer Neg Hx    Stomach cancer Neg Hx    Esophageal cancer Neg Hx     Medications: Patient's Medications  New Prescriptions   No medications on file  Previous Medications   ALBUTEROL (PROAIR HFA) 108 (90 BASE) MCG/ACT INHALER    Inhale 1-2 puffs into the lungs every 6 (six) hours as needed for wheezing or shortness of breath.   ALBUTEROL (PROVENTIL) (2.5 MG/3ML) 0.083% NEBULIZER SOLUTION    Take 3 mLs (2.5 mg total) by nebulization every 6 (six) hours as needed for wheezing or shortness  of breath.   ANASTROZOLE (ARIMIDEX) 1 MG TABLET    TAKE 1 TABLET BY MOUTH EVERY DAY   ASPIRIN LOW DOSE 81 MG EC TABLET    TAKE 1 TABLET BY MOUTH EVERY DAY   ATORVASTATIN (LIPITOR) 80 MG TABLET    TAKE 1 TABLET BY MOUTH EVERY DAY   BUDESONIDE-FORMOTEROL (SYMBICORT) 160-4.5 MCG/ACT INHALER    Inhale 2 puffs into the lungs 2 (two) times daily.   CARVEDILOL (COREG) 12.5 MG TABLET    TAKE 1 TABLET BY MOUTH 2 TIMES DAILY   CETIRIZINE (ZYRTEC) 10 MG TABLET    TAKE 1 TABLET BY MOUTH EVERY DAY   CLOPIDOGREL (PLAVIX) 75 MG TABLET    Take 1 tablet (75 mg total) by mouth daily.   CONTINUOUS BLOOD GLUC SENSOR (FREESTYLE LIBRE 14 DAY SENSOR) MISC    PLACE 1 DEVICE ON THE SKIN AS DIRECTED EVERY 14 DAYS   DICLOFENAC SODIUM (VOLTAREN) 1 % GEL    Apply 2 g topically 4 (four) times daily.   DULOXETINE (CYMBALTA) 60 MG CAPSULE    Take one capsule by mouth once daily for anxiety.   FEBUXOSTAT (ULORIC) 40 MG TABLET    Take 1 tablet (40 mg total) by mouth daily.   FLUTICASONE (FLONASE) 50 MCG/ACT NASAL SPRAY    Place 1 spray into both nostrils in the morning and at bedtime.   FOLIC ACID (FOLVITE) 1 MG TABLET    TAKE 1 TABLET BY MOUTH EVERY DAY   FUROSEMIDE (LASIX) 20 MG TABLET    TAKE 1 TABLET BY MOUTH EVERY DAY AS NEEDED   GUAIFENESIN (MUCINEX) 600 MG 12 HR TABLET    Take 1 tablet (600 mg total) by mouth 2  (two) times daily as needed.   INSULIN ASPART (NOVOLOG FLEXPEN RELION [place])    Inject 16 Units into the skin daily.   INSULIN GLARGINE, 2 UNIT DIAL, (TOUJEO MAX SOLOSTAR) 300 UNIT/ML SOLOSTAR PEN    Inject 42 Units into the skin daily. Patient assistance program provides   ISOSORBIDE MONONITRATE (IMDUR) 60 MG 24 HR TABLET    Take 1 tablet (60 mg total) by mouth daily.   LINACLOTIDE (LINZESS) 145 MCG CAPS CAPSULE    Take 1 capsule (145 mcg total) by mouth daily before breakfast.   MECLIZINE (ANTIVERT) 25 MG TABLET    Take 1 tablet (25 mg total) by mouth 3 (three) times daily as needed for dizziness.   MEMANTINE (NAMENDA) 5 MG TABLET    TAKE 2 TABLETS BY MOUTH EVERY DAY and TAKE 1 TABLET BY MOUTH EVERY EVENING   NITROGLYCERIN (NITROSTAT) 0.4 MG SL TABLET    Take one tablet under the tongue every 5 minutes as needed for chest pain   NYSTATIN CREAM (MYCOSTATIN)    Apply 1 application topically 2 (two) times daily.   OXYCODONE (OXY IR/ROXICODONE) 5 MG IMMEDIATE RELEASE TABLET    Take 1 tablet (5 mg total) by mouth every 12 (twelve) hours as needed for severe pain. Must last 30 days.   PANTOPRAZOLE (PROTONIX) 20 MG TABLET    Take 1 tablet (20 mg total) by mouth daily.   POLYETHYLENE GLYCOL POWDER (MIRALAX) 17 GM/SCOOP POWDER    17 gm mix with liquid twice daily as needed   SODIUM POLYSTYRENE (SPS) 15 GM/60ML SUSPENSION    TAKE 41ms BY MOUTH ONCE A WEEK TO keep potassium down   VITAMIN D, ERGOCALCIFEROL, (DRISDOL) 1.25 MG (50000 UNIT) CAPS CAPSULE    TAKE 1 CAPSULE BY MOUTH ONCE WEEKLY  Modified Medications   No medications on file  Discontinued Medications   No medications on file    Physical Exam:  There were no vitals filed for this visit. There is no height or weight on file to calculate BMI. Wt Readings from Last 3 Encounters:  08/24/21 (!) 342 lb 9.6 oz (155.4 kg)  08/23/21 (!) 309 lb (140.2 kg)  07/19/21 (!) 333 lb (151 kg)    Physical Exam Constitutional:      Appearance: Normal  appearance.  Pulmonary:     Effort: Pulmonary effort is normal.  Neurological:     Mental Status: She is alert. Mental status is at baseline.  Psychiatric:        Mood and Affect: Mood normal.    Labs reviewed: Basic Metabolic Panel: Recent Labs    07/04/21 1323 07/04/21 1853 07/05/21 0449 07/05/21 1235 07/08/21 0456 08/24/21 1625 09/09/21 1129  NA 140   < > 138   < > 136 139 140  K 5.1   < > 5.8*   < > 5.0 5.5* 4.3  CL 108  --  111   < > 112* 108 108  CO2 23  --  18*   < > 18* 24 24  GLUCOSE 98  --  148*   < > 137* 172* 178*  BUN 57*  --  60*   < > 53* 37* 43*  CREATININE 1.68*  --  1.69*   < > 1.72* 1.60* 1.69*  CALCIUM 9.1  --  8.9   < > 8.9 8.7 8.7*  MG 1.7  --   --   --   --   --   --   TSH  --   --  1.194  --   --   --   --    < > = values in this interval not displayed.   Liver Function Tests: Recent Labs    03/09/21 1352 07/04/21 1323 09/09/21 1129  AST 10* 15 9*  ALT '8 15 8  '$ ALKPHOS 101 94 97  BILITOT <0.2* 0.6 0.3  PROT 7.8 7.7 7.6  ALBUMIN 3.0* 3.0* 3.2*   No results for input(s): LIPASE, AMYLASE in the last 8760 hours. No results for input(s): AMMONIA in the last 8760 hours. CBC: Recent Labs    07/08/21 0456 08/24/21 1625 09/09/21 1129  WBC 12.8* 13.3* 14.2*  NEUTROABS 7.7 9,616* 10.8*  HGB 9.2* 9.6* 8.8*  HCT 30.4* 30.2* 29.3*  MCV 93.8 91.8 95.1  PLT 228 283 248   Lipid Panel: Recent Labs    01/26/21 1405  CHOL 97  HDL 36*  LDLCALC 42  TRIG 109  CHOLHDL 2.7   TSH: Recent Labs    07/05/21 0449  TSH 1.194   A1C: Lab Results  Component Value Date   HGBA1C 7.0 (H) 07/04/2021     Assessment/Plan 1. Drug-induced constipation -educated on taking linzess as prescribed.to follow up if symptoms worsen or fail to improve.    Next appt: 12/09/2021  [name]. Harle Battiest  [facility] [phone]    Virtual Visit via Deloris Ping, video  I connected with patient on 10/06/21 at  9:00 AM EDT by video  and verified that I am speaking with the correct person using two identifiers.  Location: Patient: home Provider: [place]   I discussed the limitations, risks, security and privacy concerns of performing an evaluation and management service by telephone and the availability of in person appointments. I also  discussed with the patient that there may be a patient responsible charge related to this service. The patient expressed understanding and agreed to proceed.   I discussed the assessment and treatment plan with the patient. The patient was provided an opportunity to ask questions and all were answered. The patient agreed with the plan and demonstrated an understanding of the instructions.   The patient was advised to call back or seek an in-person evaluation if the symptoms worsen or if the condition fails to improve as anticipated.  I provided 12 minutes of non-face-to-face time during this encounter.  [name]. Harle Battiest Avs printed and mailed

## 2021-10-10 ENCOUNTER — Other Ambulatory Visit: Payer: Self-pay | Admitting: Nurse Practitioner

## 2021-10-10 DIAGNOSIS — R519 Headache, unspecified: Secondary | ICD-10-CM

## 2021-10-10 DIAGNOSIS — R1084 Generalized abdominal pain: Secondary | ICD-10-CM | POA: Diagnosis not present

## 2021-10-10 DIAGNOSIS — R42 Dizziness and giddiness: Secondary | ICD-10-CM

## 2021-10-11 ENCOUNTER — Encounter: Payer: Self-pay | Admitting: Student in an Organized Health Care Education/Training Program

## 2021-10-11 ENCOUNTER — Ambulatory Visit
Payer: PPO | Attending: Student in an Organized Health Care Education/Training Program | Admitting: Student in an Organized Health Care Education/Training Program

## 2021-10-11 VITALS — BP 133/80 | HR 86 | Temp 97.3°F | Ht 67.0 in | Wt 342.0 lb

## 2021-10-11 DIAGNOSIS — M25521 Pain in right elbow: Secondary | ICD-10-CM

## 2021-10-11 DIAGNOSIS — M48062 Spinal stenosis, lumbar region with neurogenic claudication: Secondary | ICD-10-CM

## 2021-10-11 DIAGNOSIS — G894 Chronic pain syndrome: Secondary | ICD-10-CM | POA: Diagnosis not present

## 2021-10-11 DIAGNOSIS — G8929 Other chronic pain: Secondary | ICD-10-CM

## 2021-10-11 DIAGNOSIS — M79641 Pain in right hand: Secondary | ICD-10-CM | POA: Diagnosis not present

## 2021-10-11 DIAGNOSIS — M170 Bilateral primary osteoarthritis of knee: Secondary | ICD-10-CM

## 2021-10-11 DIAGNOSIS — M47816 Spondylosis without myelopathy or radiculopathy, lumbar region: Secondary | ICD-10-CM

## 2021-10-11 DIAGNOSIS — M5416 Radiculopathy, lumbar region: Secondary | ICD-10-CM | POA: Insufficient documentation

## 2021-10-11 MED ORDER — OXYCODONE-ACETAMINOPHEN 7.5-325 MG PO TABS: 1.0000 | ORAL_TABLET | Freq: Two times a day (BID) | ORAL | 0 refills | Status: AC | PRN

## 2021-10-11 MED ORDER — OXYCODONE-ACETAMINOPHEN 7.5-325 MG PO TABS: 1.0000 | ORAL_TABLET | Freq: Two times a day (BID) | ORAL | 0 refills | Status: DC | PRN

## 2021-10-11 NOTE — Progress Notes (Signed)
PROVIDER NOTE: Information contained herein reflects review and annotations entered in association with encounter. Interpretation of such information and data should be left to medically-trained personnel. Information provided to patient can be located elsewhere in the medical record under "Patient Instructions". Document created using STT-dictation technology, any transcriptional errors that may result from process are unintentional.    Patient: [name]  Service Category: E/M  Provider: Gillis Santa, MD  DOB: [birth year]  DOS: 10/11/2021  Specialty: Interventional Pain Management  MRN: [id number]  Setting: Ambulatory outpatient  PCP: [name], NP  Type: Established Patient    Referring Provider: [name], NP  Location: Office  Delivery: Face-to-face     HPI  [name], a 87 y.o. year old female, is here today because of her Chronic pain of right hand [M79.641, G89.29]. [name] primary complain today is Shoulder Pain (left)  Last encounter: My last encounter with her was on 07/19/21  Pertinent problems: [name] has Severe episode of recurrent major depressive disorder, without psychotic features ([place]); Chronic radicular lumbar pain; Lumbar spondylosis; Spinal stenosis, lumbar region, with neurogenic claudication; Bilateral primary osteoarthritis of knee; and Pain management contract signed on their pertinent problem list. Pain Assessment: Severity of Chronic pain is reported as a 10-Worst pain ever/10. Location: Shoulder Left/pain radiaties everywhere. Onset: More than a month ago. Quality: Aching, Burning, Throbbing, Tingling, Constant. Timing: Constant. Modifying factor(s): Meds and laying down. Vitals:  height is _0  (1.702 m) and weight is 342 lb (155.1 kg) (abnormal). Her temperature is 97.3 F (36.3 C) (abnormal). Her blood pressure is 133/80 and her pulse is 86. Her oxygen saturation is 98%.   Reason for encounter: medication management.    [name]  presents today for medication management.  She has severe pain and states that her 5 mg of oxycodone is not helping to manage her pain.  She is requesting an increase to 10 mg that she can take twice a day.  She states that she takes 10 mg anyway because of 5 mg are not very helpful hence she runs out early at the end of the month.  She only has 4 tablets left indicating that she has been using more of her medication than prescribed.  I informed her safe opioid prescribing guidelines especially in the context of her morbid obesity and cardiopulmonary disease.  Rather than increasing to 10 mg, we will escalate to 7.5 mg that she can take every 12 hours as needed.  Patient endorsed understanding. Pharmacotherapy Assessment    Analgesic: Percocet 7.5 mg twice daily as needed, quantity 60/month  Monitoring: [place] PMP: PDMP reviewed during this encounter.       Pharmacotherapy: No side-effects or adverse reactions reported. Compliance: No problems identified. Effectiveness: Clinically acceptable.  [name], RN  10/11/2021  1:08 PM  Sign when Signing Visit Nursing Pain Medication Assessment:  Safety precautions to be maintained throughout the outpatient stay will include: orient to surroundings, keep bed in low position, maintain call bell within reach at all times, provide assistance with transfer out of bed and ambulation.  Medication Inspection Compliance: Pill count conducted under aseptic conditions, in front of the patient. Neither the pills nor the bottle was removed from the patient's sight at any time. Once count was completed pills were immediately returned to the patient in their original bottle.  Medication: See above Pill/Patch Count:  4 of 60 pills remain Pill/Patch Appearance: Markings consistent with prescribed medication Bottle Appearance: Standard pharmacy  container. Clearly labeled. Filled Date: 5 / 15 / 2023 Last Medication intake:  TodaySafety precautions to be maintained  throughout the outpatient stay will include: orient to surroundings, keep bed in low position, maintain call bell within reach at all times, provide assistance with transfer out of bed and ambulation.      UDS: : Final result   Visible to patient: Yes (not seen)   Next appt: 10/26/2020 at 01:45 PM in Podiatry [name], DPM)   Dx: Chronic pain syndrome   0 Result Notes   Ref Range & Units 6 mo ago  Amphetamines, IA Cutoff:50 ng/mL Negative   Barbiturates, IA Cutoff:0.1 ug/mL Negative   Benzodiazepines, IA Cutoff:20 ng/mL Negative   Cocaine & Metabolite, IA Cutoff:25 ng/mL Negative   Phencyclidine, IA Cutoff:8 ng/mL Negative   THC(Marijuana) Metabolite, IA Cutoff:5 ng/mL Negative   Opiates, IA Cutoff:5 ng/mL Negative   Oxycodones, IA Cutoff:5 ng/mL Negative   Comment: Presumptive immunoassay result indicated need for further  testing; definitive confirmation was negative.   Methadone, IA Cutoff:25 ng/mL Negative   Propoxyphene, IA Cutoff:50 ng/mL Negative   Comment: This test was developed and its performance characteristics  determined by [facility].  It has not been cleared or approved  by the Food and Drug Administration.         ROS  Constitutional: Denies any fever or chills Gastrointestinal: No reported hemesis, hematochezia, vomiting, or acute GI distress Musculoskeletal:  left shoulder pain, low back pain Neurological: No reported episodes of acute onset apraxia, aphasia, dysarthria, agnosia, amnesia, paralysis, loss of coordination, or loss of consciousness  Medication Review  DULoxetine, FreeStyle Libre 14 Day Sensor, Insulin Aspart, Vitamin D (Ergocalciferol), albuterol, anastrozole, aspirin EC, atorvastatin, budesonide-formoterol, carvedilol, cetirizine, clopidogrel, diclofenac Sodium, febuxostat, fluticasone, folic acid, furosemide, guaiFENesin, insulin glargine (2 Unit Dial), isosorbide mononitrate, linaclotide, meclizine, memantine, nitroGLYCERIN, nystatin cream,  oxyCODONE, oxyCODONE-acetaminophen, pantoprazole, polyethylene glycol powder, and sodium polystyrene  History Review  Allergy: [name] is allergic to sulfonamide derivatives, lokelma [sodium zirconium cyclosilicate], aricept [donepezil hcl], and tramadol. Drug: Ms. Stoltz  reports no history of drug use. Alcohol:  reports no history of alcohol use. Tobacco:  reports that she has never smoked. She has been exposed to tobacco smoke. She has never used smokeless tobacco. Social: Ms. Benbrook  reports that she has never smoked. She has been exposed to tobacco smoke. She has never used smokeless tobacco. She reports that she does not drink alcohol and does not use drugs. Medical:  has a past medical history of Allergy, Anemia, unspecified, Anxiety, Arthritis, Breast cancer ([place]), Chronic diastolic CHF (congestive heart failure) ([place]), CKD (chronic kidney disease), stage III ([place]), Coronary artery disease, Dementia ([place]), Depression, Diabetes mellitus, GERD (gastroesophageal reflux disease), Gout, unspecified, Headache, History of blood clots, Hyperlipidemia, Hypertension, Insomnia, Joint pain, Joint swelling, Morbid obesity ([place]), Myocardial infarction ([place]) (2004), Non-STEMI (non-ST elevated myocardial infarction) ([place]) (02/15/2020), Numbness, Obstructive sleep apnea, Osteoarthritis, Osteoarthritis, Osteoarthrosis, unspecified whether generalized or localized, lower leg, Pain, chronic, Polymyalgia rheumatica ([place]), Pulmonary emboli ([place]) (01/2012), Urinary incontinence, Vertigo, and Wears dentures. Surgical: Ms. Celia  has a past surgical history that includes Appendectomy; Cardiac catheterization; Gastric bypass (1977 ); blood clots/legs and lungs (2013); MI with stent placement (2004); left heart catheterization with coronary angiogram (N/A, 06/29/2014); Coronary angioplasty (2); Eye surgery (Bilateral); Colonoscopy; Abdominal hysterectomy; Breast lumpectomy with radioactive seed localization (Left, 11/05/2014);  Excision of skin tag (Right, 11/05/2014); Breast lumpectomy (Left, 11/05/2014); Breast biopsy (Left, 07/22/2014); Breast biopsy (Left, 02/10/2013); Esophagogastroduodenoscopy (egd) with propofol (N/A,  11/07/2016); Pars plana vitrectomy (Right, 10/23/2018); Membrane peel (Right, 10/23/2018); LEFT HEART CATH AND CORONARY ANGIOGRAPHY (N/A, 02/17/2020); and CORONARY STENT INTERVENTION (N/A, 02/17/2020). Family: family history includes Arthritis in her father and sister; Breast cancer (age of onset: 73) in her mother; Diabetes in her father and sister; Heart disease in her cousin and mother; Hypertension in her father; Obesity in her sister; Throat cancer in her father.  Laboratory Chemistry Profile   Renal Lab Results  Component Value Date   BUN 43 (H) 09/09/2021   CREATININE 1.69 (H) 09/09/2021   LABCREA 36 06/14/2017   BCR 23 (H) 08/24/2021   GFR 49.91 (L) 07/31/2012   GFRAA 38 (L) 03/02/2020   GFRNONAA 30 (L) 09/09/2021     Hepatic Lab Results  Component Value Date   AST 9 (L) 09/09/2021   ALT 8 09/09/2021   ALBUMIN 3.2 (L) 09/09/2021   ALKPHOS 97 09/09/2021   LIPASE 39 08/27/2018     Electrolytes Lab Results  Component Value Date   NA 140 09/09/2021   K 4.3 09/09/2021   CL 108 09/09/2021   CALCIUM 8.7 (L) 09/09/2021   MG 1.7 07/04/2021   PHOS 3.7 10/09/2017     Bone Lab Results  Component Value Date   VD25OH 64.31 09/09/2021     Inflammation (CRP: Acute Phase) (ESR: Chronic Phase) Lab Results  Component Value Date   CRP 1.0 (H) 01/30/2019   ESRSEDRATE 110 (H) 09/25/2014   LATICACIDVEN 1.5 01/25/2019       Note: Above Lab results reviewed.  Recent Imaging Review  NM Pulmonary Perf and Vent CLINICAL DATA:  Positive D-dimer, pulmonary embolism suspected  EXAM: NUCLEAR MEDICINE PERFUSION LUNG SCAN  TECHNIQUE: Perfusion images were obtained in multiple projections after intravenous injection of radiopharmaceutical.  Ventilation scans intentionally deferred if  perfusion scan and chest x-ray adequate for interpretation during COVID 19 epidemic.  RADIOPHARMACEUTICALS:  4.1 mCi Tc-58mMAA IV  COMPARISON:  03/03/2020  FINDINGS: There are no discrete wedge-shaped perfusion defects. No significant interval changes are noted.  IMPRESSION: There are no wedge-shaped perfusion defects. There are no imaging signs of pulmonary embolism.  Electronically Signed   By: PElmer PickerM.D.   On: 07/05/2021 13:09 ECHOCARDIOGRAM COMPLETE    ECHOCARDIOGRAM REPORT       Patient Name:   EOLEVA KOODate of Exam: 07/05/2021 Medical Rec #:  [id number]      Height:       67.0 in Accession #:    [id number]     Weight:       313.0 lb Date of Birth:  6Oct 17, 1936      BSA:          2.446 m Patient Age:    89years        BP:           125/55 mmHg Patient Gender: F               HR:           91 bpm. Exam Location:  Inpatient  Procedure: 2D Echo, Limited Echo, Cardiac Doppler and Color Doppler  Indications:    R55 SYNCOPE   History:        Patient has prior history of Echocardiogram examinations, most                 recent 02/15/2020. CAD and Previous Myocardial Infarction; Risk  Factors:Hypertension and Dyslipidemia.   Sonographer:    Beryle Beams Referring Phys: [id number] PING T ZHANG    Sonographer Comments: Suboptimal parasternal window, suboptimal apical window, no subcostal window and patient is morbidly obese. Image acquisition challenging due to patient body habitus and Image acquisition challenging due to respiratory motion.  PATIENT UNABLEO ROLL ON SIDE DUE TO BODY HABITUS IMPRESSIONS   1. Left ventricular ejection fraction, by estimation, is 55 to 60%. The left ventricle has normal function. Left ventricular endocardial border not optimally defined to evaluate regional wall motion- there are no wall motion abnormalities seen in views  obtained. Left ventricular diastolic parameters are consistent with Grade I diastolic  dysfunction (impaired relaxation).  2. Right ventricular systolic function is normal. The right ventricular size is normal.  3. The mitral valve was not well visualized. No evidence of mitral valve regurgitation. No evidence of mitral stenosis.  4. The aortic valve was not well visualized. Aortic valve regurgitation is not visualized.  Comparison(s): Difficult comparison- this is a technically difficult study.  FINDINGS  Left Ventricle: Left ventricular ejection fraction, by estimation, is 55 to 60%. The left ventricle has normal function. Left ventricular endocardial border not optimally defined to evaluate regional wall motion. The left ventricular internal cavity  size was normal in size. There is no left ventricular hypertrophy. Left ventricular diastolic parameters are consistent with Grade I diastolic dysfunction (impaired relaxation).  Right Ventricle: The right ventricular size is normal. Right vetricular wall thickness was not well visualized. Right ventricular systolic function is normal.  Left Atrium: Left atrial size was normal in size.  Right Atrium: Right atrial size was normal in size.  Pericardium: There is no evidence of pericardial effusion. Presence of epicardial fat layer.  Mitral Valve: The mitral valve was not well visualized. No evidence of mitral valve regurgitation. No evidence of mitral valve stenosis.  Tricuspid Valve: The tricuspid valve is not well visualized. Tricuspid valve regurgitation is not demonstrated. No evidence of tricuspid stenosis.  Aortic Valve: The aortic valve was not well visualized. Aortic valve regurgitation is not visualized.  Pulmonic Valve: The pulmonic valve was not well visualized. Pulmonic valve regurgitation is not visualized.  Aorta: The ascending aorta was not well visualized.  IAS/Shunts: The interatrial septum was not well visualized.      LV Volumes (MOD) LV vol d, MOD A2C: 36.1 ml LV vol d, MOD A4C: 54.7 ml LV vol s, MOD  A2C: 15.2 ml LV vol s, MOD A4C: 27.0 ml LV SV MOD A2C:     20.9 ml LV SV MOD A4C:     54.7 ml LV SV MOD BP:      24.5 ml  LEFT ATRIUM             Index LA Vol (A2C):   37.9 ml 15.49 ml/m LA Vol (A4C):   23.6 ml 9.65 ml/m LA Biplane Vol: 31.3 ml 12.80 ml/m  PULMONIC VALVE PV Vmax:       0.82 m/s PV Vmean:      57.000 cm/s PV VTI:        0.204 m PV Peak grad:  2.7 mmHg PV Mean grad:  2.0 mmHg    MITRAL VALVE MV Area (PHT): 2.79 cm MV Decel Time: 272 msec MV E velocity: 85.70 cm/s MV A velocity: 144.00 cm/s MV E/A ratio:  0.60  Rudean Haskell MD Electronically signed by Rudean Haskell MD Signature Date/Time: 07/05/2021/10:09:36 AM      Final    Note: Reviewed  Physical Exam  General appearance: Well nourished, well developed, and well hydrated. In no apparent acute distress Mental status: Alert, oriented x 3 (person, place, & time)       Respiratory: No evidence of acute respiratory distress Eyes: PERLA Vitals: BP 133/80   Pulse 86   Temp (!) 97.3 F (36.3 C)   Ht _0  (1.702 m)   Wt (!) 342 lb (155.1 kg)   SpO2 98%   BMI 53.56 kg/m  BMI: Estimated body mass index is 53.56 kg/m as calculated from the following:   Height as of this encounter: _1  (1.702 m).   Weight as of this encounter: 342 lb (155.1 kg). Ideal: Ideal body weight: 61.6 kg (135 lb 12.9 oz) Adjusted ideal body weight: 99 kg (218 lb 4.5 oz)  Thoracic Spine Area Exam  Skin & Axial Inspection: No masses, redness, or swelling Alignment: Symmetrical Functional ROM: Pain restricted ROM Stability: No instability detected Muscle Tone/Strength: Functionally intact. No obvious neuro-muscular anomalies detected. Sensory (Neurological): Unimpaired Muscle strength & Tone: No palpable anomalies  Bilateral shoulder pain  Right hand pain   Lumbar Exam  Skin & Axial Inspection: No masses, redness, or swelling Alignment: Symmetrical Functional ROM: Pain restricted ROM        Stability: No instability detected Muscle Tone/Strength: Functionally intact. No obvious neuro-muscular anomalies detected. Sensory (Neurological): Musculoskeletal pain pattern  Ambulation: Patient came in today in a wheel chair Gait: Modified gait pattern (slower gait speed, wider stride width, and longer stance duration) associated with morbid obesity Posture: Difficulty standing up straight, due to pain    Lower Extremity Exam      Side: Right lower extremity   Side: Left lower extremity  Stability: No instability observed           Stability: No instability observed          Skin & Extremity Inspection: Skin color, temperature, and hair growth are WNL. No peripheral edema or cyanosis. No masses, redness, swelling, asymmetry, or associated skin lesions. No contractures.   Skin & Extremity Inspection: Skin color, temperature, and hair growth are WNL. No peripheral edema or cyanosis. No masses, redness, swelling, asymmetry, or associated skin lesions. No contractures.  Functional ROM: Pain restricted ROM for all joints of the lower extremity           Functional ROM: Pain restricted ROM for all joints of the lower extremity          Muscle Tone/Strength: Functionally intact. No obvious neuro-muscular anomalies detected.   Muscle Tone/Strength: Functionally intact. No obvious neuro-muscular anomalies detected.  Sensory (Neurological): Neurogenic pain pattern         Sensory (Neurological): Neurogenic pain pattern        DTR: Patellar: deferred today Achilles: deferred today Plantar: deferred today   DTR: Patellar: deferred today Achilles: deferred today Plantar: deferred today  Palpation: No palpable anomalies   Palpation: No palpable anomalies     Assessment   Status Diagnosis  Persistent Persistent Persistent 1. Chronic pain of right hand   2. Lumbar spondylosis   3. Right elbow pain   4. Spinal stenosis, lumbar region, with neurogenic claudication   5. Bilateral primary  osteoarthritis of knee   6. Lumbar facet arthropathy   7. Chronic radicular lumbar pain   8. Chronic pain syndrome       Plan of Care  [name] has a current medication list which includes the following long-term medication(s): albuterol, albuterol, atorvastatin, budesonide-formoterol, carvedilol,  cetirizine, duloxetine, febuxostat, fluticasone, furosemide, toujeo max solostar, isosorbide mononitrate, linaclotide, memantine, and pantoprazole.  Pharmacotherapy (Medications Ordered): Meds ordered this encounter  Medications   oxyCODONE-acetaminophen (PERCOCET) 7.5-325 MG tablet    Sig: Take 1 tablet by mouth every 12 (twelve) hours as needed for moderate pain or severe pain. Must last 30 days.    Dispense:  60 tablet    Refill:  0    Chronic Pain: STOP Act (Not applicable) Fill 1 day early if closed on refill date. Avoid benzodiazepines within 8 hours of opioids   oxyCODONE-acetaminophen (PERCOCET) 7.5-325 MG tablet    Sig: Take 1 tablet by mouth every 12 (twelve) hours as needed for moderate pain or severe pain. Must last 30 days.    Dispense:  60 tablet    Refill:  0    Chronic Pain: STOP Act (Not applicable) Fill 1 day early if closed on refill date. Avoid benzodiazepines within 8 hours of opioids   oxyCODONE-acetaminophen (PERCOCET) 7.5-325 MG tablet    Sig: Take 1 tablet by mouth every 12 (twelve) hours as needed for moderate pain or severe pain. Must last 30 days.    Dispense:  60 tablet    Refill:  0    Chronic Pain: STOP Act (Not applicable) Fill 1 day early if closed on refill date. Avoid benzodiazepines within 8 hours of opioids   Continue Cymbalta as prescribed. Discussed alternative to Linzess with PCP as the patient is endorsing stomach aches and cramps when she takes it.  Orders Placed This Encounter  Procedures   ToxASSURE Select 13 (MW), Urine    Volume: 30 ml(s). Minimum 3 ml of urine is needed. Document temperature of fresh sample. Indications: Long  term (current) use of opiate analgesic [phone])    Order Specific Question:   Release to patient    Answer:   Immediate     Follow-up plan:   Return in about 3 months (around 01/11/2022) for Medication Management, in person.      Interventional management options:  Considering:   Intra-articular knee Hyalgan injections can be done in wheelchair Patient is on Plavix and is morbidly obese and unable to get on fluoroscopy table for interventional therapy.   PRN Procedures:   None at this time      Recent Visits Date Type Provider Dept  07/19/21 Office Visit Gillis Santa, MD [facility]  Showing recent visits within past 90 days and meeting all other requirements Today's Visits Date Type Provider Dept  10/11/21 Office Visit Gillis Santa, MD [facility]  Showing today's visits and meeting all other requirements Future Appointments No visits were found meeting these conditions. Showing future appointments within next 90 days and meeting all other requirements  I discussed the assessment and treatment plan with the patient. The patient was provided an opportunity to ask questions and all were answered. The patient agreed with the plan and demonstrated an understanding of the instructions.  Patient advised to call back or seek an in-person evaluation if the symptoms or condition worsens.  Duration of encounter: 30 minutes.  Note by: Gillis Santa, MD Date: 10/11/2021; Time: 1:44 PM

## 2021-10-11 NOTE — Progress Notes (Signed)
Nursing Pain Medication Assessment:  Safety precautions to be maintained throughout the outpatient stay will include: orient to surroundings, keep bed in low position, maintain call bell within reach at all times, provide assistance with transfer out of bed and ambulation.  Medication Inspection Compliance: Pill count conducted under aseptic conditions, in front of the patient. Neither the pills nor the bottle was removed from the patient's sight at any time. Once count was completed pills were immediately returned to the patient in their original bottle.  Medication: See above Pill/Patch Count:  4 of 60 pills remain Pill/Patch Appearance: Markings consistent with prescribed medication Bottle Appearance: Standard pharmacy container. Clearly labeled. Filled Date: 5 / 15 / 2023 Last Medication intake:  TodaySafety precautions to be maintained throughout the outpatient stay will include: orient to surroundings, keep bed in low position, maintain call bell within reach at all times, provide assistance with transfer out of bed and ambulation.

## 2021-10-12 ENCOUNTER — Other Ambulatory Visit: Payer: Self-pay | Admitting: Hematology

## 2021-10-12 ENCOUNTER — Other Ambulatory Visit: Payer: Self-pay | Admitting: Nurse Practitioner

## 2021-10-13 ENCOUNTER — Other Ambulatory Visit: Payer: Self-pay

## 2021-10-13 ENCOUNTER — Encounter (HOSPITAL_COMMUNITY): Payer: Self-pay | Admitting: Emergency Medicine

## 2021-10-13 ENCOUNTER — Encounter: Payer: Self-pay | Admitting: Hematology

## 2021-10-13 ENCOUNTER — Emergency Department (HOSPITAL_COMMUNITY): Payer: PPO

## 2021-10-13 ENCOUNTER — Encounter (HOSPITAL_COMMUNITY): Payer: Self-pay

## 2021-10-13 ENCOUNTER — Inpatient Hospital Stay (HOSPITAL_COMMUNITY)
Admission: EM | Admit: 2021-10-13 | Discharge: 2021-10-22 | DRG: 291 | Disposition: A | Discharge status: Home / Self Care | Payer: PPO | Attending: Internal Medicine | Admitting: Internal Medicine

## 2021-10-13 DIAGNOSIS — D509 Iron deficiency anemia, unspecified: Secondary | ICD-10-CM | POA: Diagnosis present

## 2021-10-13 DIAGNOSIS — N390 Urinary tract infection, site not specified: Secondary | ICD-10-CM | POA: Diagnosis present

## 2021-10-13 DIAGNOSIS — F0394 Unspecified dementia, unspecified severity, with anxiety: Secondary | ICD-10-CM | POA: Diagnosis present

## 2021-10-13 DIAGNOSIS — Z6841 Body Mass Index (BMI) 40.0 and over, adult: Secondary | ICD-10-CM

## 2021-10-13 DIAGNOSIS — E785 Hyperlipidemia, unspecified: Secondary | ICD-10-CM | POA: Diagnosis not present

## 2021-10-13 DIAGNOSIS — Z66 Do not resuscitate: Secondary | ICD-10-CM | POA: Diagnosis present

## 2021-10-13 DIAGNOSIS — E1169 Type 2 diabetes mellitus with other specified complication: Secondary | ICD-10-CM | POA: Diagnosis present

## 2021-10-13 DIAGNOSIS — Z79890 Hormone replacement therapy (postmenopausal): Secondary | ICD-10-CM

## 2021-10-13 DIAGNOSIS — I509 Heart failure, unspecified: Principal | ICD-10-CM

## 2021-10-13 DIAGNOSIS — E871 Hypo-osmolality and hyponatremia: Secondary | ICD-10-CM | POA: Diagnosis present

## 2021-10-13 DIAGNOSIS — I110 Hypertensive heart disease with heart failure: Secondary | ICD-10-CM | POA: Diagnosis not present

## 2021-10-13 DIAGNOSIS — J9811 Atelectasis: Secondary | ICD-10-CM | POA: Diagnosis not present

## 2021-10-13 DIAGNOSIS — C50919 Malignant neoplasm of unspecified site of unspecified female breast: Secondary | ICD-10-CM

## 2021-10-13 DIAGNOSIS — G894 Chronic pain syndrome: Secondary | ICD-10-CM | POA: Diagnosis present

## 2021-10-13 DIAGNOSIS — E1122 Type 2 diabetes mellitus with diabetic chronic kidney disease: Secondary | ICD-10-CM | POA: Diagnosis present

## 2021-10-13 DIAGNOSIS — I5033 Acute on chronic diastolic (congestive) heart failure: Secondary | ICD-10-CM | POA: Diagnosis present

## 2021-10-13 DIAGNOSIS — B9620 Unspecified Escherichia coli [E. coli] as the cause of diseases classified elsewhere: Secondary | ICD-10-CM | POA: Diagnosis present

## 2021-10-13 DIAGNOSIS — K5900 Constipation, unspecified: Secondary | ICD-10-CM | POA: Diagnosis present

## 2021-10-13 DIAGNOSIS — N1831 Chronic kidney disease, stage 3a: Secondary | ICD-10-CM | POA: Diagnosis present

## 2021-10-13 DIAGNOSIS — G4733 Obstructive sleep apnea (adult) (pediatric): Secondary | ICD-10-CM | POA: Diagnosis present

## 2021-10-13 DIAGNOSIS — Z888 Allergy status to other drugs, medicaments and biological substances status: Secondary | ICD-10-CM

## 2021-10-13 DIAGNOSIS — F32A Depression, unspecified: Secondary | ICD-10-CM | POA: Diagnosis present

## 2021-10-13 DIAGNOSIS — Z803 Family history of malignant neoplasm of breast: Secondary | ICD-10-CM

## 2021-10-13 DIAGNOSIS — R079 Chest pain, unspecified: Secondary | ICD-10-CM | POA: Diagnosis not present

## 2021-10-13 DIAGNOSIS — Z882 Allergy status to sulfonamides status: Secondary | ICD-10-CM

## 2021-10-13 DIAGNOSIS — F418 Other specified anxiety disorders: Secondary | ICD-10-CM | POA: Diagnosis present

## 2021-10-13 DIAGNOSIS — J440 Chronic obstructive pulmonary disease with acute lower respiratory infection: Secondary | ICD-10-CM | POA: Diagnosis present

## 2021-10-13 DIAGNOSIS — N179 Acute kidney failure, unspecified: Secondary | ICD-10-CM | POA: Diagnosis present

## 2021-10-13 DIAGNOSIS — I130 Hypertensive heart and chronic kidney disease with heart failure and stage 1 through stage 4 chronic kidney disease, or unspecified chronic kidney disease: Secondary | ICD-10-CM | POA: Diagnosis present

## 2021-10-13 DIAGNOSIS — Z955 Presence of coronary angioplasty implant and graft: Secondary | ICD-10-CM

## 2021-10-13 DIAGNOSIS — E875 Hyperkalemia: Secondary | ICD-10-CM | POA: Diagnosis not present

## 2021-10-13 DIAGNOSIS — Z833 Family history of diabetes mellitus: Secondary | ICD-10-CM

## 2021-10-13 DIAGNOSIS — E11649 Type 2 diabetes mellitus with hypoglycemia without coma: Secondary | ICD-10-CM | POA: Diagnosis present

## 2021-10-13 DIAGNOSIS — E1140 Type 2 diabetes mellitus with diabetic neuropathy, unspecified: Secondary | ICD-10-CM | POA: Diagnosis present

## 2021-10-13 DIAGNOSIS — Z79899 Other long term (current) drug therapy: Secondary | ICD-10-CM

## 2021-10-13 DIAGNOSIS — N189 Chronic kidney disease, unspecified: Secondary | ICD-10-CM | POA: Diagnosis not present

## 2021-10-13 DIAGNOSIS — J189 Pneumonia, unspecified organism: Secondary | ICD-10-CM | POA: Diagnosis present

## 2021-10-13 DIAGNOSIS — Z7902 Long term (current) use of antithrombotics/antiplatelets: Secondary | ICD-10-CM

## 2021-10-13 DIAGNOSIS — Z794 Long term (current) use of insulin: Secondary | ICD-10-CM

## 2021-10-13 DIAGNOSIS — I252 Old myocardial infarction: Secondary | ICD-10-CM

## 2021-10-13 DIAGNOSIS — E66813 Obesity, class 3: Secondary | ICD-10-CM | POA: Diagnosis present

## 2021-10-13 DIAGNOSIS — I25118 Atherosclerotic heart disease of native coronary artery with other forms of angina pectoris: Secondary | ICD-10-CM | POA: Diagnosis present

## 2021-10-13 DIAGNOSIS — Z9049 Acquired absence of other specified parts of digestive tract: Secondary | ICD-10-CM

## 2021-10-13 DIAGNOSIS — I10 Essential (primary) hypertension: Secondary | ICD-10-CM | POA: Diagnosis not present

## 2021-10-13 DIAGNOSIS — R109 Unspecified abdominal pain: Secondary | ICD-10-CM | POA: Diagnosis not present

## 2021-10-13 DIAGNOSIS — Z8249 Family history of ischemic heart disease and other diseases of the circulatory system: Secondary | ICD-10-CM

## 2021-10-13 DIAGNOSIS — R0602 Shortness of breath: Secondary | ICD-10-CM | POA: Diagnosis not present

## 2021-10-13 DIAGNOSIS — J449 Chronic obstructive pulmonary disease, unspecified: Secondary | ICD-10-CM | POA: Diagnosis present

## 2021-10-13 DIAGNOSIS — K219 Gastro-esophageal reflux disease without esophagitis: Secondary | ICD-10-CM | POA: Diagnosis present

## 2021-10-13 DIAGNOSIS — M353 Polymyalgia rheumatica: Secondary | ICD-10-CM | POA: Diagnosis present

## 2021-10-13 DIAGNOSIS — R059 Cough, unspecified: Secondary | ICD-10-CM | POA: Diagnosis not present

## 2021-10-13 DIAGNOSIS — Z86711 Personal history of pulmonary embolism: Secondary | ICD-10-CM

## 2021-10-13 DIAGNOSIS — Z993 Dependence on wheelchair: Secondary | ICD-10-CM

## 2021-10-13 DIAGNOSIS — Z7982 Long term (current) use of aspirin: Secondary | ICD-10-CM

## 2021-10-13 DIAGNOSIS — J4489 Other specified chronic obstructive pulmonary disease: Secondary | ICD-10-CM | POA: Diagnosis present

## 2021-10-13 LAB — CBC WITH DIFFERENTIAL/PLATELET
Abs Immature Granulocytes: 0.08 10*3/uL — ABNORMAL HIGH (ref 0.00–0.07)
Basophils Absolute: 0 10*3/uL (ref 0.0–0.1)
Basophils Relative: 0 %
Eosinophils Absolute: 0.4 10*3/uL (ref 0.0–0.5)
Eosinophils Relative: 2 %
HCT: 32.4 % — ABNORMAL LOW (ref 36.0–46.0)
Hemoglobin: 10.2 g/dL — ABNORMAL LOW (ref 12.0–15.0)
Immature Granulocytes: 1 %
Lymphocytes Relative: 15 %
Lymphs Abs: 2.4 10*3/uL (ref 0.7–4.0)
MCH: 30.3 pg (ref 26.0–34.0)
MCHC: 31.5 g/dL (ref 30.0–36.0)
MCV: 96.1 fL (ref 80.0–100.0)
Monocytes Absolute: 0.7 10*3/uL (ref 0.1–1.0)
Monocytes Relative: 5 %
Neutro Abs: 12.5 10*3/uL — ABNORMAL HIGH (ref 1.7–7.7)
Neutrophils Relative %: 77 %
Platelets: 254 10*3/uL (ref 150–400)
RBC: 3.37 MIL/uL — ABNORMAL LOW (ref 3.87–5.11)
RDW: 14.5 % (ref 11.5–15.5)
WBC: 16.1 10*3/uL — ABNORMAL HIGH (ref 4.0–10.5)
nRBC: 0 % (ref 0.0–0.2)

## 2021-10-13 LAB — COMPREHENSIVE METABOLIC PANEL
ALT: 9 U/L (ref 0–44)
AST: 10 U/L — ABNORMAL LOW (ref 15–41)
Albumin: 2.9 g/dL — ABNORMAL LOW (ref 3.5–5.0)
Alkaline Phosphatase: 88 U/L (ref 38–126)
Anion gap: 7 (ref 5–15)
BUN: 18 mg/dL (ref 8–23)
CO2: 26 mmol/L (ref 22–32)
Calcium: 8.4 mg/dL — ABNORMAL LOW (ref 8.9–10.3)
Chloride: 108 mmol/L (ref 98–111)
Creatinine, Ser: 1.13 mg/dL — ABNORMAL HIGH (ref 0.44–1.00)
GFR, Estimated: 48 mL/min — ABNORMAL LOW (ref >60)
Glucose, Bld: 198 mg/dL — ABNORMAL HIGH (ref 70–99)
Potassium: 4.3 mmol/L (ref 3.5–5.1)
Sodium: 141 mmol/L (ref 135–145)
Total Bilirubin: 0.4 mg/dL (ref 0.3–1.2)
Total Protein: 7.2 g/dL (ref 6.5–8.1)

## 2021-10-13 LAB — URINALYSIS, ROUTINE W REFLEX MICROSCOPIC
Bilirubin Urine: NEGATIVE
Glucose, UA: NEGATIVE mg/dL
Hgb urine dipstick: NEGATIVE
Ketones, ur: NEGATIVE mg/dL
Nitrite: NEGATIVE
Protein, ur: 100 mg/dL — AB
Specific Gravity, Urine: 1.016 (ref 1.005–1.030)
pH: 5 (ref 5.0–8.0)

## 2021-10-13 LAB — BRAIN NATRIURETIC PEPTIDE: B Natriuretic Peptide: 116.4 pg/mL — ABNORMAL HIGH (ref 0.0–100.0)

## 2021-10-13 LAB — LIPASE, BLOOD: Lipase: 30 U/L (ref 11–51)

## 2021-10-13 LAB — CBG MONITORING, ED: Glucose-Capillary: 173 mg/dL — ABNORMAL HIGH (ref 70–99)

## 2021-10-13 LAB — GLUCOSE, CAPILLARY: Glucose-Capillary: 246 mg/dL — ABNORMAL HIGH (ref 70–99)

## 2021-10-13 MED ORDER — ONDANSETRON HCL 4 MG/2ML IJ SOLN: 4.0000 mg | Freq: Four times a day (QID) | INTRAMUSCULAR | Status: DC | PRN

## 2021-10-13 MED ORDER — MOMETASONE FURO-FORMOTEROL FUM 200-5 MCG/ACT IN AERO
2.0000 | INHALATION_SPRAY | Freq: Two times a day (BID) | RESPIRATORY_TRACT | Status: DC
  Administered 2021-10-14 – 2021-10-22 (×17): 2 via RESPIRATORY_TRACT
  Filled 2021-10-13 (×2): qty 8.8

## 2021-10-13 MED ORDER — ENOXAPARIN SODIUM 40 MG/0.4ML IJ SOSY
40.0000 mg | PREFILLED_SYRINGE | INTRAMUSCULAR | Status: DC
  Administered 2021-10-14 – 2021-10-15 (×3): 40 mg via SUBCUTANEOUS
  Filled 2021-10-13 (×3): qty 0.4

## 2021-10-13 MED ORDER — DULOXETINE HCL 60 MG PO CPEP
60.0000 mg | ORAL_CAPSULE | Freq: Every day | ORAL | Status: DC
  Administered 2021-10-14 – 2021-10-22 (×9): 60 mg via ORAL
  Filled 2021-10-13 (×9): qty 1

## 2021-10-13 MED ORDER — FUROSEMIDE 10 MG/ML IJ SOLN
40.0000 mg | Freq: Two times a day (BID) | INTRAMUSCULAR | Status: DC
  Administered 2021-10-14 – 2021-10-16 (×5): 40 mg via INTRAVENOUS
  Filled 2021-10-13 (×5): qty 4

## 2021-10-13 MED ORDER — ALBUTEROL SULFATE (2.5 MG/3ML) 0.083% IN NEBU
3.0000 mL | INHALATION_SOLUTION | Freq: Four times a day (QID) | RESPIRATORY_TRACT | Status: DC | PRN
  Administered 2021-10-14: 3 mL via RESPIRATORY_TRACT
  Filled 2021-10-13 (×2): qty 3

## 2021-10-13 MED ORDER — SODIUM CHLORIDE 0.9% FLUSH
3.0000 mL | Freq: Two times a day (BID) | INTRAVENOUS | Status: DC
  Administered 2021-10-14 – 2021-10-22 (×16): 3 mL via INTRAVENOUS

## 2021-10-13 MED ORDER — ONDANSETRON HCL 4 MG PO TABS: 4.0000 mg | ORAL_TABLET | Freq: Four times a day (QID) | ORAL | Status: DC | PRN

## 2021-10-13 MED ORDER — INSULIN ASPART 100 UNIT/ML IJ SOLN
0.0000 [IU] | Freq: Three times a day (TID) | INTRAMUSCULAR | Status: DC
  Administered 2021-10-14: 11 [IU] via SUBCUTANEOUS
  Administered 2021-10-14: 7 [IU] via SUBCUTANEOUS
  Administered 2021-10-14: 4 [IU] via SUBCUTANEOUS
  Administered 2021-10-15: 11 [IU] via SUBCUTANEOUS
  Administered 2021-10-15: 4 [IU] via SUBCUTANEOUS
  Administered 2021-10-15: 7 [IU] via SUBCUTANEOUS
  Administered 2021-10-16: 3 [IU] via SUBCUTANEOUS
  Administered 2021-10-16: 4 [IU] via SUBCUTANEOUS
  Administered 2021-10-16: 7 [IU] via SUBCUTANEOUS
  Administered 2021-10-17: 11 [IU] via SUBCUTANEOUS
  Administered 2021-10-17: 7 [IU] via SUBCUTANEOUS
  Administered 2021-10-17 – 2021-10-18 (×2): 4 [IU] via SUBCUTANEOUS
  Administered 2021-10-18: 15 [IU] via SUBCUTANEOUS
  Administered 2021-10-18: 4 [IU] via SUBCUTANEOUS
  Administered 2021-10-19: 15 [IU] via SUBCUTANEOUS
  Administered 2021-10-19 – 2021-10-20 (×3): 4 [IU] via SUBCUTANEOUS
  Administered 2021-10-20 (×2): 7 [IU] via SUBCUTANEOUS
  Administered 2021-10-21 (×2): 4 [IU] via SUBCUTANEOUS
  Administered 2021-10-21: 11 [IU] via SUBCUTANEOUS
  Administered 2021-10-22: 3 [IU] via SUBCUTANEOUS

## 2021-10-13 MED ORDER — LINACLOTIDE 145 MCG PO CAPS: 145.0000 ug | ORAL_CAPSULE | Freq: Every day | ORAL | Status: DC | PRN

## 2021-10-13 MED ORDER — LIVING BETTER WITH HEART FAILURE BOOK: Freq: Once | Status: AC

## 2021-10-13 MED ORDER — MEMANTINE HCL 10 MG PO TABS
10.0000 mg | ORAL_TABLET | Freq: Every day | ORAL | Status: DC
  Administered 2021-10-14 – 2021-10-22 (×9): 10 mg via ORAL
  Filled 2021-10-13 (×9): qty 1

## 2021-10-13 MED ORDER — INSULIN GLARGINE-YFGN 100 UNIT/ML ~~LOC~~ SOLN
25.0000 [IU] | Freq: Every day | SUBCUTANEOUS | Status: DC
  Filled 2021-10-13: qty 0.25

## 2021-10-13 MED ORDER — GUAIFENESIN ER 600 MG PO TB12
600.0000 mg | ORAL_TABLET | Freq: Two times a day (BID) | ORAL | Status: DC | PRN
  Administered 2021-10-14 (×2): 600 mg via ORAL
  Filled 2021-10-13 (×2): qty 1

## 2021-10-13 MED ORDER — FEBUXOSTAT 40 MG PO TABS
40.0000 mg | ORAL_TABLET | Freq: Every day | ORAL | Status: DC
  Administered 2021-10-14 – 2021-10-22 (×9): 40 mg via ORAL
  Filled 2021-10-13 (×9): qty 1

## 2021-10-13 MED ORDER — ACETAMINOPHEN 325 MG PO TABS
650.0000 mg | ORAL_TABLET | Freq: Four times a day (QID) | ORAL | Status: DC | PRN
  Administered 2021-10-14: 650 mg via ORAL
  Filled 2021-10-13: qty 2

## 2021-10-13 MED ORDER — ATORVASTATIN CALCIUM 80 MG PO TABS
80.0000 mg | ORAL_TABLET | Freq: Every day | ORAL | Status: DC
  Administered 2021-10-14 – 2021-10-21 (×9): 80 mg via ORAL
  Filled 2021-10-13 (×9): qty 1

## 2021-10-13 MED ORDER — MEMANTINE HCL 10 MG PO TABS: 5.0000 mg | ORAL_TABLET | ORAL | Status: DC

## 2021-10-13 MED ORDER — ASPIRIN 81 MG PO TBEC
81.0000 mg | DELAYED_RELEASE_TABLET | Freq: Every day | ORAL | Status: DC
  Administered 2021-10-14 – 2021-10-22 (×9): 81 mg via ORAL
  Filled 2021-10-13 (×9): qty 1

## 2021-10-13 MED ORDER — ANASTROZOLE 1 MG PO TABS
1.0000 mg | ORAL_TABLET | Freq: Every day | ORAL | Status: DC
  Administered 2021-10-14 – 2021-10-22 (×9): 1 mg via ORAL
  Filled 2021-10-13 (×9): qty 1

## 2021-10-13 MED ORDER — CLOPIDOGREL BISULFATE 75 MG PO TABS
75.0000 mg | ORAL_TABLET | Freq: Every day | ORAL | Status: DC
  Administered 2021-10-14 – 2021-10-22 (×9): 75 mg via ORAL
  Filled 2021-10-13 (×9): qty 1

## 2021-10-13 MED ORDER — PANTOPRAZOLE SODIUM 20 MG PO TBEC
20.0000 mg | DELAYED_RELEASE_TABLET | Freq: Every day | ORAL | Status: DC
  Administered 2021-10-14 – 2021-10-22 (×9): 20 mg via ORAL
  Filled 2021-10-13 (×10): qty 1

## 2021-10-13 MED ORDER — CARVEDILOL 12.5 MG PO TABS
12.5000 mg | ORAL_TABLET | Freq: Two times a day (BID) | ORAL | Status: DC
  Administered 2021-10-14 – 2021-10-22 (×18): 12.5 mg via ORAL
  Filled 2021-10-13 (×18): qty 1

## 2021-10-13 MED ORDER — OXYCODONE-ACETAMINOPHEN 5-325 MG PO TABS
1.0000 | ORAL_TABLET | Freq: Two times a day (BID) | ORAL | Status: DC | PRN
  Administered 2021-10-13 – 2021-10-20 (×7): 1 via ORAL
  Filled 2021-10-13 (×7): qty 1

## 2021-10-13 MED ORDER — ISOSORBIDE MONONITRATE ER 30 MG PO TB24
60.0000 mg | ORAL_TABLET | Freq: Every day | ORAL | Status: DC
  Administered 2021-10-14 – 2021-10-22 (×9): 60 mg via ORAL
  Filled 2021-10-13 (×9): qty 2

## 2021-10-13 MED ORDER — FUROSEMIDE 10 MG/ML IJ SOLN
40.0000 mg | Freq: Once | INTRAMUSCULAR | Status: AC
  Administered 2021-10-13: 40 mg via INTRAVENOUS
  Filled 2021-10-13: qty 4

## 2021-10-13 MED ORDER — MEMANTINE HCL 5 MG PO TABS
5.0000 mg | ORAL_TABLET | Freq: Every day | ORAL | Status: DC
  Administered 2021-10-14 – 2021-10-21 (×9): 5 mg via ORAL
  Filled 2021-10-13 (×10): qty 1

## 2021-10-13 MED ORDER — ACETAMINOPHEN 650 MG RE SUPP: 650.0000 mg | Freq: Four times a day (QID) | RECTAL | Status: DC | PRN

## 2021-10-13 NOTE — Assessment & Plan Note (Addendum)
Patient was admitted to the cardiac ward and was placed on furosemide IV for diuresis, negative fluid balance was achieved, - 3,628 ml, with significant improvement in her symptoms.   Echocardiogram from 02/23, with preserved LV systolic function EF 55 to 60%, preserved RV systolic function, no significant valvular disease. (noted poor windows).   Patient will continue heart failure management with carvedilol, and daily furosemide 40 mg. Holding on RAS inhibition due to risk of hyperkalemia and worsening renal function. Plan to follow up renal function in 7 days as outpatient.

## 2021-10-13 NOTE — ED Provider Notes (Signed)
[facility] EMERGENCY DEPARTMENT Provider Note   CSN: [id number] Arrival date & time: 10/13/21  1049     History  Chief Complaint  Patient presents with   Shortness of Breath   Abdominal Pain    [name] is a 87 y.o. female.  Pt is a 87y/o female with hx of DM, COPD, CAD with stenting, chronic HF with EF of 13-24% with diastolic dysfunction grade I, history of PE off anticoagulation due to worsening of iron deficiency anemia in 2021, COPD, breast CA on hormone therapy, morbid obesity, multiple OA on oxycodone, chronic ambulation dysfunction, diabetic neuropathy, chronic dizziness on meclizine, chronic constipation on linzess who is presenting today with worsening SOB.  Patient reports that earlier she was having abdominal issues where it felt like her abdomen was shaking on the inside because she had not had a bowel movement for 7 days.  She does take Linzess but only takes it every third day because she feels that it makes her go to the bathroom too often if she takes it daily.  She had a bowel movement at 4 AM this morning but reports she was up multiple times throughout the night feeling like she needed to go but could not until 4 AM.  She says since that time she is now no longer having any abdominal problems.  However in the last month she has noticed worsening shortness of breath that became much worse last night to where anytime she tried to lay down she was having shortness of breath and orthopnea.  Family is present with her in the room and reports now she gets out of breath doing everything.  She is pushed around in the wheelchair most the time but even to stand and pivot to the toilet makes her short of breath and wheeze.  They report this has been gradually worsening in the last month.  Patient also reports that the swelling in her legs has gradually been worsening.  She does not weigh herself daily as she reports she does not have a scale at home.  She has had a  mild cough today but it is nonproductive.  She denies any history of asthma but reports she had been given an inhaler in the past but felt that it never really helped.  She is taking Lasix daily and reports she thinks her doctor may have given her an extra prescription but she is not sure.  She is taking 20 mg a day at this time.  She denies any urinary symptoms or fever.  She was having some chest pain earlier which she described as a pressure in the middle of her chest but it is gone currently.  She does not wear oxygen at home but reports she was so winded when she went to get into the bed today they put some on her and it has made her feel much better.  The history is provided by the patient, a relative and medical records.  Shortness of Breath Associated symptoms: abdominal pain   Abdominal Pain Associated symptoms: shortness of breath        Home Medications Prior to Admission medications   Medication Sig Start Date End Date Taking? Authorizing Provider  albuterol (PROAIR HFA) 108 (90 Base) MCG/ACT inhaler Inhale 1-2 puffs into the lungs every 6 (six) hours as needed for wheezing or shortness of breath. 12/19/18   [name], NP  albuterol (PROVENTIL) (2.5 MG/3ML) 0.083% nebulizer solution Take 3 mLs (2.5 mg  total) by nebulization every 6 (six) hours as needed for wheezing or shortness of breath. 12/17/20   Ngetich, Dinah C, NP  anastrozole (ARIMIDEX) 1 MG tablet TAKE 1 TABLET BY MOUTH EVERY DAY 09/08/21   [name], MD  ASPIRIN LOW DOSE 81 MG EC tablet TAKE 1 TABLET BY MOUTH EVERY DAY 04/21/21   [name], NP  atorvastatin (LIPITOR) 80 MG tablet TAKE 1 TABLET BY MOUTH EVERY DAY 02/07/21   [name], NP  budesonide-formoterol (SYMBICORT) 160-4.5 MCG/ACT inhaler Inhale 2 puffs into the lungs 2 (two) times daily. 01/26/21   [name], NP  carvedilol (COREG) 12.5 MG tablet TAKE 1 TABLET BY MOUTH 2 TIMES DAILY 08/01/21   [name], NP  cetirizine  (ZYRTEC) 10 MG tablet TAKE 1 TABLET BY MOUTH EVERY DAY 05/30/21   [name], NP  clopidogrel (PLAVIX) 75 MG tablet Take 1 tablet (75 mg total) by mouth daily. 05/26/21   Freada Bergeron, MD  Continuous Blood Gluc Sensor (FREESTYLE LIBRE 14 DAY SENSOR) MISC PLACE 1 DEVICE ON THE SKIN AS DIRECTED EVERY 14 DAYS 08/08/21   Shamleffer, [name], MD  diclofenac Sodium (VOLTAREN) 1 % GEL Apply 2 g topically 4 (four) times daily. 09/05/21   [name], NP  DULoxetine (CYMBALTA) 60 MG capsule TAKE 1 CAPSULE BY MOUTH EVERY DAY FOR anxiety 10/12/21   [name], NP  febuxostat (ULORIC) 40 MG tablet TAKE 1 TABLET BY MOUTH EVERY DAY 10/12/21   [name], NP  fluticasone (FLONASE) 50 MCG/ACT nasal spray Place 1 spray into both nostrils in the morning and at bedtime. 12/17/20   Ngetich, Dinah C, NP  folic acid (FOLVITE) 1 MG tablet TAKE 1 TABLET BY MOUTH EVERY DAY 08/01/21   [name], NP  furosemide (LASIX) 20 MG tablet TAKE 1 TABLET BY MOUTH EVERY DAY AS NEEDED 09/08/21   [name], NP  guaiFENesin (MUCINEX) 600 MG 12 hr tablet Take 1 tablet (600 mg total) by mouth 2 (two) times daily as needed. 03/14/21   Medina-Vargas, Monina C, NP  Insulin Aspart (NOVOLOG FLEXPEN RELION Keys) Inject 16 Units into the skin daily.    [provider]  insulin glargine, 2 Unit Dial, (TOUJEO MAX SOLOSTAR) 300 UNIT/ML Solostar Pen Inject 42 Units into the skin daily. Patient assistance program provides 05/24/21   Shamleffer, [name], MD  isosorbide mononitrate (IMDUR) 60 MG 24 hr tablet TAKE 1 TABLET BY MOUTH EVERY DAY 10/12/21   [name], NP  linaclotide [facility]) 145 MCG CAPS capsule Take 1 capsule (145 mcg total) by mouth daily before breakfast. 09/05/21   [name], NP  meclizine (ANTIVERT) 25 MG tablet TAKE 1 TABLET BY MOUTH THREE TIMES DAILY AS NEEDED FOR DIZZINESS 10/11/21   [name], NP  memantine (NAMENDA) 5 MG tablet TAKE 2 TABLETS BY  MOUTH EVERY DAY and TAKE 1 TABLET BY MOUTH EVERY EVENING 09/26/21   [name], NP  nitroGLYCERIN (NITROSTAT) 0.4 MG SL tablet Take one tablet under the tongue every 5 minutes as needed for chest pain 09/24/13   [name], NP  nystatin cream (MYCOSTATIN) Apply 1 application topically 2 (two) times daily. 12/17/20   Ngetich, Dinah C, NP  oxyCODONE (OXY IR/ROXICODONE) 5 MG immediate release tablet Take 1 tablet (5 mg total) by mouth every 12 (twelve) hours as needed for severe pain. Must last 30 days. 09/19/21 10/19/21  Gillis Santa, MD  oxyCODONE-acetaminophen (  PERCOCET) 7.5-325 MG tablet Take 1 tablet by mouth every 12 (twelve) hours as needed for moderate pain or severe pain. Must last 30 days. 10/12/21 11/11/21  Gillis Santa, MD  oxyCODONE-acetaminophen (PERCOCET) 7.5-325 MG tablet Take 1 tablet by mouth every 12 (twelve) hours as needed for moderate pain or severe pain. Must last 30 days. 11/11/21 12/11/21  Gillis Santa, MD  oxyCODONE-acetaminophen (PERCOCET) 7.5-325 MG tablet Take 1 tablet by mouth every 12 (twelve) hours as needed for moderate pain or severe pain. Must last 30 days. 12/11/21 01/10/22  Gillis Santa, MD  pantoprazole (PROTONIX) 20 MG tablet Take 1 tablet (20 mg total) by mouth daily. 07/11/21   [name], NP  polyethylene glycol powder (MIRALAX) 17 GM/SCOOP powder 17 gm mix with liquid twice daily as needed 09/05/21   [name], NP  sodium polystyrene (SPS) 15 GM/60ML suspension TAKE 4ms BY MOUTH ONCE A WEEK TO keep potassium down 07/14/21   ELauree Chandler NP  Vitamin D, Ergocalciferol, (DRISDOL) 1.25 MG (50000 UNIT) CAPS capsule TAKE 1 CAPSULE BY MOUTH every 7 days 10/12/21   KBrunetta Genera MD      Allergies    Sulfonamide derivatives, Lokelma [sodium zirconium cyclosilicate], Aricept [donepezil hcl], and Tramadol    Review of Systems   Review of Systems  Respiratory:  Positive for shortness of breath.   Gastrointestinal:  Positive for abdominal  pain.    Physical Exam Updated Vital Signs BP 123/67   Pulse 80   Temp 97.8 F (36.6 C) (Oral)   Resp 20   Ht '5\' 7"'$  (1.702 m)   Wt (!) 156 kg   SpO2 100%   BMI 53.87 kg/m  Physical Exam Vitals and nursing note reviewed.  Constitutional:      General: She is not in acute distress.    Appearance: She is well-developed.  HENT:     Head: Normocephalic and atraumatic.  Eyes:     Conjunctiva/sclera: Conjunctivae normal.     Pupils: Pupils are equal, round, and reactive to light.  Cardiovascular:     Rate and Rhythm: Normal rate and regular rhythm.     Heart sounds: No murmur heard. Pulmonary:     Effort: Pulmonary effort is normal. No respiratory distress.     Breath sounds: Wheezing present. No rales.     Comments: Decreased breath sounds in bilateral lower lobes Abdominal:     General: There is no distension.     Palpations: Abdomen is soft.     Tenderness: There is no abdominal tenderness. There is no guarding or rebound.  Musculoskeletal:        General: No tenderness. Normal range of motion.     Cervical back: Normal range of motion and neck supple.     Right lower leg: Edema present.     Left lower leg: Edema present.     Comments: Trace edema in bilateral lower extremities  Skin:    General: Skin is warm and dry.     Findings: No erythema or rash.  Neurological:     Mental Status: She is alert and oriented to person, place, and time. Mental status is at baseline.  Psychiatric:        Mood and Affect: Mood normal.        Behavior: Behavior normal.     ED Results / Procedures / Treatments   Labs (all labs ordered are listed, but only abnormal results are displayed) Labs Reviewed  CBC WITH DIFFERENTIAL/PLATELET - Abnormal;  Notable for the following components:      Result Value   WBC 16.1 (*)    RBC 3.37 (*)    Hemoglobin 10.2 (*)    HCT 32.4 (*)    Neutro Abs 12.5 (*)    Abs Immature Granulocytes 0.08 (*)    All other components within normal limits   BRAIN NATRIURETIC PEPTIDE - Abnormal; Notable for the following components:   B Natriuretic Peptide 116.4 (*)    All other components within normal limits  COMPREHENSIVE METABOLIC PANEL - Abnormal; Notable for the following components:   Glucose, Bld 198 (*)    Creatinine, Ser 1.13 (*)    Calcium 8.4 (*)    Albumin 2.9 (*)    AST 10 (*)    GFR, Estimated 48 (*)    All other components within normal limits  CBG MONITORING, ED - Abnormal; Notable for the following components:   Glucose-Capillary 173 (*)    All other components within normal limits  LIPASE, BLOOD  URINALYSIS, ROUTINE W REFLEX MICROSCOPIC    EKG EKG Interpretation  Date/Time:  Thursday October 13 2021 11:02:25 EDT Ventricular Rate:  86 PR Interval:  188 QRS Duration: 86 QT Interval:  386 QTC Calculation: 461 R Axis:   -4 Text Interpretation: Normal sinus rhythm Normal ECG When compared with ECG of 04-Jul-2021 13:24, PREVIOUS ECG IS PRESENT No significant change since last tracing Confirmed by Blanchie Dessert [phone]) on 10/13/2021 5:10:50 PM  Radiology DG Chest 2 View  Result Date: 10/13/2021 CLINICAL DATA:  Shortness of breath and abdominal pain EXAM: CHEST - 2 VIEW COMPARISON:  07/04/2021 FINDINGS: Similar low lung volumes with some degree of residual central vascular congestion and basilar atelectasis. No current CHF pattern or definite pneumonia. Negative for effusion or pneumothorax. Trachea midline. Normal heart size. Aorta atherosclerotic. Degenerative changes noted of the spine and left shoulder. IMPRESSION: Low volume exam with vascular congestion and basilar atelectasis. Electronically Signed   By: Jerilynn Mages.  Shick M.D.   On: 10/13/2021 11:44    Procedures Procedures    Medications Ordered in ED Medications  furosemide (LASIX) injection 40 mg (has no administration in time range)    ED Course/ Medical Decision Making/ A&P                           Medical Decision Making Amount and/or Complexity of Data  Reviewed Independent Historian: caregiver External Data Reviewed: notes.    Details: prior hospitalization Labs: ordered. Decision-making details documented in ED Course. Radiology: ordered and independent interpretation performed. Decision-making details documented in ED Course. ECG/medicine tests: ordered and independent interpretation performed. Decision-making details documented in ED Course.  Risk Prescription drug management.   Pt with multiple medical problems and comorbidities and presenting today with a complaint that caries a high risk for morbidity and mortality.  Presenting today initially with complaint of abdominal pain which is now resolved after having a bowel movement but also complaining of shortness of breath and chest pain.  Patient does have a cardiac history and last echo showed preserved EF of 55 to 60% with grade 1 diastolic dysfunction.  Patient denies any chest pain or abdominal pain at this time but was extremely winded even with movement from the wheelchair to the bed with wheezing and was placed on oxygen which she reports made her feel much better.  Low suspicion for an acute abdominal process today such as small bowel obstruction, diverticulitis, cholecystitis or appendicitis.  Patient's  abdomen is benign at this time.  Concern for CHF exacerbation as patient has had gradually worsening swelling in her legs and worsening shortness of breath over the last 1 month.  She is taking 20 mg of Lasix daily.  Low suspicion for acute infectious process.  Patient does wheeze with minimal activity but has no prior history of asthma.  She reports she has a gotten an inhaler in the past but it is never helped her.  I independently interpreted patient's EKG and labs.  EKG shows normal sinus rhythm without acute findings.  Labs show CBC with a leukocytosis of 16,000, preserved hemoglobin of 10.2 and normal platelet count.  BNP is elevated today at 116 from patient's baseline of 80 and  creatinine improved to 1.13 from 1.6 last week with normal electrolytes and LFTs.  Lipase within normal limits. I have independently visualized and interpreted pt's images today.  Chest x-ray today shows some vascular congestion but does look improved from prior image.  No significant cardiomegaly. Given patient's worsening shortness of breath, oxygen requirement for work of breathing and concern for fluid overload she was given IV Lasix.  Because this has been gradual over the last month and now she is having difficulty moving around and does not have oxygen at home will admit for diuresis.  Low suspicion for infectious etiology at this time and no exam findings to support getting an abdominal scan.  Did encourage the patient that she would need to start taking Linzess more often so that she would have a bowel movement more often given her medications are constipating          Final Clinical Impression(s) / ED Diagnoses Final diagnoses:  Acute on chronic congestive heart failure, unspecified heart failure type [facility])    Rx / DC Orders ED Discharge Orders     None         Blanchie Dessert, MD 10/13/21 1747

## 2021-10-13 NOTE — Assessment & Plan Note (Addendum)
No chest pain, no clinical sings of acute coronary syndrome.  Dual antiplatelet therapy with aspirin and clopidogrel. Continue with atorvastatin and isosorbide.

## 2021-10-13 NOTE — Assessment & Plan Note (Signed)
Continue Percocet q12h as needed.

## 2021-10-13 NOTE — ED Notes (Signed)
Pt called for vitals update w/o response.

## 2021-10-13 NOTE — ED Provider Triage Note (Signed)
Emergency Medicine Provider Triage Evaluation Note  [name] , a 87 y.o. female  was evaluated in triage.  Pt complains of abdominal pain, constipation, had bowel movement last night but before that not for 7 days.  Review of Systems  Positive: Abdominal pain, constipation Negative: Nausea, vomiting  Physical Exam  BP (!) 144/82 (BP Location: Right Arm)   Pulse 85   Temp 97.8 F (36.6 C) (Oral)   SpO2 98%  Gen:   Awake, no distress   Resp:  Normal effort  MSK:   Moves extremities without difficulty  Other:    Medical Decision Making  Medically screening exam initiated at 11:07 AM.  Appropriate orders placed.  [name] was informed that the remainder of the evaluation will be completed by another provider, this initial triage assessment does not replace that evaluation, and the importance of remaining in the ED until their evaluation is complete.     Tacy Learn, PA-C 10/13/21 1138

## 2021-10-13 NOTE — Hospital Course (Addendum)
[name] is a 87 y.o. female with medical history significant for chronic HFpEF (EF 55-60% by TTE 07/05/2021), CAD (BMS to RCA 2004, DESx2 to mid and proximal RCA 2012, recent NSTEMI 02/2020 s/p DES to RCA), COPD, CKD stage IIIa, IDDM, HTN, HLD, anemia of chronic disease, prior PE/DVT, left breast cancer, depression/anxiety, chronic pain, dementia who is admitted with acute on chronic HFpEF.  Patient is mostly wheelchair bound at home. Reported 2 weeks of worsening dyspnea on exertion and orthopnea, along with lower extremity edema. On her initial physical examination her blood pressure was 144/82, HR 85, rr 18 and 02 saturation 98%, lungs with distant breath sounds, heart with S1 and S2 present and regular, abdomen not distended and positive lower extremity edema.   Na 141, K 4,5 cl 108, bicarbonate at 26, glucose 198 bun 18 cr 1,13 BMP 116 Wbc 16.1, hgb 10.2 plt 254  UA SG 1,016 , 100 protein.   Chest radiograph with bilateral hilar vascular congestion.   EKG 86 bpm, normal axis, normal intervals, sinus rhythm, no significant ST segment or T wave changes.  Patient was placed on diuretic therapy with improvement in her volume status.  She was placed on antibiotic therapy for bronchitis and urinary tract infection.  Hospitalization complicated with hyperkalemia.   06/17 patient had improvement in her symptoms and electrolytes, plan to discharge home and follow up as outpatient.

## 2021-10-13 NOTE — Assessment & Plan Note (Signed)
Continue atorvastatin

## 2021-10-13 NOTE — Assessment & Plan Note (Addendum)
Patient was diagnosed with bronchitis and early pneumonia (present on admission).  Placed on antibiotic therapy with good toleration.   Follow up chest radiograph with bibasilar atelectasis, more right than left.   Urine infection, present on admission.  Urine analysis with 10 to 20 wbc, urine culture positive for E coli >100,000 CFU. Patient was placed on oral antibiotic therapy with good toleration.  Plan to complete 5 days of cefdinir.

## 2021-10-13 NOTE — Assessment & Plan Note (Addendum)
Continue home duloxetine, during her hospitalization she was placed on hydroxyzine with good toleration.

## 2021-10-13 NOTE — Assessment & Plan Note (Addendum)
Hyperkalemia, hyponatremia.   Patient tolerated well diuresis, with improvement in volume status.  Serum cr reached 2,30, at the time of discharge is down to 1,53, K is 4,7 and serum bicarbonate at 27, Na 133.  Patient will continue diuresis with oral furosemide, and instructions to follow up renal function in 7 days as outpatient.

## 2021-10-13 NOTE — ED Triage Notes (Signed)
Pt complains of abd pain this week- has had trouble having a bowel movement. Pt had a bowel movement this morning. Pt now complains of back pain, headache, and sob. Pt states when she lays down it's harder for her to breath.

## 2021-10-13 NOTE — Assessment & Plan Note (Addendum)
Continue blood pressure control with carvedilol and isosorbide.

## 2021-10-13 NOTE — Assessment & Plan Note (Addendum)
Her glucose remained stable, she was continued on long acting insulin with Semglee 25 units daily along with insulin sliding scale for glucose cover and monitoring.   Continue statin therapy.

## 2021-10-13 NOTE — H&P (Signed)
History and Physical    MATTESON BLUE UDJ:[id number] DOB: April 24, 1935 DOA: 10/13/2021  PCP: [name], NP  Patient coming from: Home  I have personally briefly reviewed patient's old medical records in [place]  Chief Complaint: Shortness of breath  HPI: [name] is a 87 y.o. female with medical history significant for chronic HFpEF (EF 55-60% by TTE 07/05/2021), CAD (BMS to RCA 2004, DESx2 to mid and proximal RCA 2012, recent NSTEMI 02/2020 s/p DES to RCA), COPD, CKD stage IIIa, IDDM, HTN, HLD, anemia of chronic disease, prior PE/DVT, left breast cancer, depression/anxiety, chronic pain, dementia who presented to the ED for evaluation of shortness of breath.  Patient mostly wheelchair dependent at baseline.  Over the last few weeks she has been having worsening exertional dyspnea when transferring in and out of wheelchair.  She normally sleeps with head elevated but has had to elevate her head further due to orthopnea.  She has also had increased swelling in her lower extremities.  She reports decreased urine output than expected.  She reports cough occasionally productive of yellow sputum.  She has occasional central chest discomfort.  Patient also reports issues with constipation.  She takes Linzess as needed but states that this causes her to have 3 bowel movements per day which she feels is excessive.  She has had occasional nausea without emesis.  ED Course  Labs/Imaging on admission: I have personally reviewed following labs and imaging studies.  Initial vitals showed BP 144/82, pulse 85, RR 18, temp 97.8 F, SPO2 98% on room air.  Patient placed on 3 L O2 via [place] for comfort.  Labs show sodium 141, potassium 4.3, bicarb 26, BUN 18, creatinine 1.13, serum glucose 198, BNP 116.4, lipase 30, WBC 16.1, hemoglobin 10.2, platelets 254,000.  2 view chest x-ray shows low lung volumes with central vascular congestion and basilar atelectasis.  Patient was given IV Lasix  40 mg once and the hospitalist service was consulted to admit for further evaluation and management.  Review of Systems: All systems reviewed and are negative except as documented in history of present illness above.   Past Medical History:  Diagnosis Date   Allergy    takes Mucinex daily as needed   Anemia, unspecified    Anxiety    takes Clonazepam daily as needed   Arthritis    Breast cancer ([place])    Chronic diastolic CHF (congestive heart failure) (HCC)    takes Furosemide daily   CKD (chronic kidney disease), stage III ([place])    Coronary artery disease    a. s/p IMI 2004 tx with BMS to RCA;  b. s/p Promus DES to RCA 2/12 c. abnormal nuc 2016 -> cath with med rx. d. NSTEMI 02/2020  mv CAD with severe ISR in pRCA and m/dRCA lesion both treated with DES.   Dementia ([place])    Depression    takes Cymbalta daily   Diabetes mellitus    insulin daily   GERD (gastroesophageal reflux disease)    takes Protonix daily   Gout, unspecified    Headache    occasionally   History of blood clots    Hyperlipidemia    takes Pravastatin daily   Hypertension    Insomnia    Joint pain    Joint swelling    Morbid obesity ([place])    Myocardial infarction ([place]) 2004   Non-STEMI (non-ST elevated myocardial infarction) ([place]) 02/15/2020   Numbness    Obstructive sleep apnea  does not wear cpap   Osteoarthritis    Osteoarthritis    Osteoarthrosis, unspecified whether generalized or localized, lower leg    Pain, chronic    Polymyalgia rheumatica ([place])    Pulmonary emboli ([place]) 01/2012   felt to need lifelong anticoagulation but Xarelto dc in 02/2020 due to need for DAPT   Urinary incontinence    takes Linzess daily   Vertigo    hx of;was taking Meclizine if needed   Wears dentures     Past Surgical History:  Procedure Laterality Date   ABDOMINAL HYSTERECTOMY     partial   APPENDECTOMY     blood clots/legs and lungs  2013   BREAST BIOPSY Left 07/22/2014   BREAST BIOPSY Left  02/10/2013   BREAST LUMPECTOMY Left 11/05/2014   BREAST LUMPECTOMY WITH RADIOACTIVE SEED LOCALIZATION Left 11/05/2014   Procedure: LEFT BREAST LUMPECTOMY WITH RADIOACTIVE SEED LOCALIZATION;  Surgeon: [name], MD;  Location: [place];  Service: General;  Laterality: Left;   CARDIAC CATHETERIZATION     COLONOSCOPY     CORONARY ANGIOPLASTY  2   CORONARY STENT INTERVENTION N/A 02/17/2020   Procedure: CORONARY STENT INTERVENTION;  Surgeon: Leonie Man, MD;  Location: [place] [facility];  Service: Cardiovascular;  Laterality: N/A;   ESOPHAGOGASTRODUODENOSCOPY (EGD) WITH PROPOFOL N/A 11/07/2016   Procedure: ESOPHAGOGASTRODUODENOSCOPY (EGD) WITH PROPOFOL;  Surgeon: Gatha Mayer, MD;  Location: [facility];  Service: Endoscopy;  Laterality: N/A;   EXCISION OF SKIN TAG Right 11/05/2014   Procedure: EXCISION OF RIGHT EYELID SKIN TAG;  Surgeon: [name], MD;  Location: [place];  Service: General;  Laterality: Right;   EYE SURGERY Bilateral    cataract    GASTRIC BYPASS  1977    reversed in 1979, [place] CATH AND CORONARY ANGIOGRAPHY N/A 02/17/2020   Procedure: LEFT HEART CATH AND CORONARY ANGIOGRAPHY;  Surgeon: Leonie Man, MD;  Location: [place] [facility];  Service: Cardiovascular;  Laterality: N/A;   LEFT HEART CATHETERIZATION WITH CORONARY ANGIOGRAM N/A 06/29/2014   Procedure: LEFT HEART CATHETERIZATION WITH CORONARY ANGIOGRAM;  Surgeon: Troy Sine, MD;  Location: [facility] CATH LAB;  Service: Cardiovascular;  Laterality: N/A;   MEMBRANE PEEL Right 10/23/2018   Procedure: MEMBRANE PEEL;  Surgeon: Hurman Horn, MD;  Location: [place];  Service: Ophthalmology;  Laterality: Right;   MI with stent placement  2004   PARS PLANA VITRECTOMY Right 10/23/2018   Procedure: PARS PLANA VITRECTOMY WITH 25 GAUGE;  Surgeon: Hurman Horn, MD;  Location: [place];  Service: Ophthalmology;  Laterality: Right;    Social History:  reports that she has never smoked. She has been  exposed to tobacco smoke. She has never used smokeless tobacco. She reports that she does not drink alcohol and does not use drugs.  Allergies  Allergen Reactions   Sulfonamide Derivatives Swelling and Other (See Comments)    Mouth swelling- no resp issues noted   Lokelma [Sodium Zirconium Cyclosilicate]     Headache, stomach upset   Aricept [Donepezil Hcl] Diarrhea and Nausea And Vomiting    GI upset/loose stools   Tramadol Nausea And Vomiting    Family History  Problem Relation Age of Onset   Breast cancer Mother 67   Heart disease Mother    Throat cancer Father    Hypertension Father    Arthritis Father    Diabetes Father    Arthritis Sister    Obesity Sister    Diabetes Sister  Heart disease Cousin    Colon cancer Neg Hx    Stomach cancer Neg Hx    Esophageal cancer Neg Hx      Prior to Admission medications   Medication Sig Start Date End Date Taking? Authorizing Provider  albuterol (PROAIR HFA) 108 (90 Base) MCG/ACT inhaler Inhale 1-2 puffs into the lungs every 6 (six) hours as needed for wheezing or shortness of breath. 12/19/18   [name], NP  albuterol (PROVENTIL) (2.5 MG/3ML) 0.083% nebulizer solution Take 3 mLs (2.5 mg total) by nebulization every 6 (six) hours as needed for wheezing or shortness of breath. 12/17/20   Ngetich, Dinah C, NP  anastrozole (ARIMIDEX) 1 MG tablet TAKE 1 TABLET BY MOUTH EVERY DAY 09/08/21   [name], MD  ASPIRIN LOW DOSE 81 MG EC tablet TAKE 1 TABLET BY MOUTH EVERY DAY 04/21/21   [name], NP  atorvastatin (LIPITOR) 80 MG tablet TAKE 1 TABLET BY MOUTH EVERY DAY 02/07/21   [name], NP  budesonide-formoterol (SYMBICORT) 160-4.5 MCG/ACT inhaler Inhale 2 puffs into the lungs 2 (two) times daily. 01/26/21   [name], NP  carvedilol (COREG) 12.5 MG tablet TAKE 1 TABLET BY MOUTH 2 TIMES DAILY 08/01/21   [name], NP  cetirizine (ZYRTEC) 10 MG tablet TAKE 1 TABLET BY MOUTH EVERY DAY 05/30/21    [name], NP  clopidogrel (PLAVIX) 75 MG tablet Take 1 tablet (75 mg total) by mouth daily. 05/26/21   Freada Bergeron, MD  Continuous Blood Gluc Sensor (FREESTYLE LIBRE 14 DAY SENSOR) MISC PLACE 1 DEVICE ON THE SKIN AS DIRECTED EVERY 14 DAYS 08/08/21   Shamleffer, [name], MD  diclofenac Sodium (VOLTAREN) 1 % GEL Apply 2 g topically 4 (four) times daily. 09/05/21   [name], NP  DULoxetine (CYMBALTA) 60 MG capsule TAKE 1 CAPSULE BY MOUTH EVERY DAY FOR anxiety 10/12/21   [name], NP  febuxostat (ULORIC) 40 MG tablet TAKE 1 TABLET BY MOUTH EVERY DAY 10/12/21   [name], NP  fluticasone (FLONASE) 50 MCG/ACT nasal spray Place 1 spray into both nostrils in the morning and at bedtime. 12/17/20   Ngetich, Dinah C, NP  folic acid (FOLVITE) 1 MG tablet TAKE 1 TABLET BY MOUTH EVERY DAY 08/01/21   [name], NP  furosemide (LASIX) 20 MG tablet TAKE 1 TABLET BY MOUTH EVERY DAY AS NEEDED 09/08/21   [name], NP  guaiFENesin (MUCINEX) 600 MG 12 hr tablet Take 1 tablet (600 mg total) by mouth 2 (two) times daily as needed. 03/14/21   Medina-Vargas, Monina C, NP  Insulin Aspart (NOVOLOG FLEXPEN RELION ) Inject 16 Units into the skin daily.    [provider]  insulin glargine, 2 Unit Dial, (TOUJEO MAX SOLOSTAR) 300 UNIT/ML Solostar Pen Inject 42 Units into the skin daily. Patient assistance program provides 05/24/21   Shamleffer, [name], MD  isosorbide mononitrate (IMDUR) 60 MG 24 hr tablet TAKE 1 TABLET BY MOUTH EVERY DAY 10/12/21   [name], NP  linaclotide [facility]) 145 MCG CAPS capsule Take 1 capsule (145 mcg total) by mouth daily before breakfast. 09/05/21   [name], NP  meclizine (ANTIVERT) 25 MG tablet TAKE 1 TABLET BY MOUTH THREE TIMES DAILY AS NEEDED FOR DIZZINESS 10/11/21   [name], NP  memantine (NAMENDA) 5 MG tablet TAKE 2 TABLETS BY MOUTH EVERY DAY and TAKE 1 TABLET BY MOUTH EVERY EVENING 09/26/21  [name], NP  nitroGLYCERIN (NITROSTAT) 0.4 MG SL tablet Take one tablet under the tongue every 5 minutes as needed for chest pain 09/24/13   [name], NP  nystatin cream (MYCOSTATIN) Apply 1 application topically 2 (two) times daily. 12/17/20   Ngetich, Dinah C, NP  oxyCODONE (OXY IR/ROXICODONE) 5 MG immediate release tablet Take 1 tablet (5 mg total) by mouth every 12 (twelve) hours as needed for severe pain. Must last 30 days. 09/19/21 10/19/21  Gillis Santa, MD  oxyCODONE-acetaminophen (PERCOCET) 7.5-325 MG tablet Take 1 tablet by mouth every 12 (twelve) hours as needed for moderate pain or severe pain. Must last 30 days. 10/12/21 11/11/21  Gillis Santa, MD  oxyCODONE-acetaminophen (PERCOCET) 7.5-325 MG tablet Take 1 tablet by mouth every 12 (twelve) hours as needed for moderate pain or severe pain. Must last 30 days. 11/11/21 12/11/21  Gillis Santa, MD  oxyCODONE-acetaminophen (PERCOCET) 7.5-325 MG tablet Take 1 tablet by mouth every 12 (twelve) hours as needed for moderate pain or severe pain. Must last 30 days. 12/11/21 01/10/22  Gillis Santa, MD  pantoprazole (PROTONIX) 20 MG tablet Take 1 tablet (20 mg total) by mouth daily. 07/11/21   [name], NP  polyethylene glycol powder (MIRALAX) 17 GM/SCOOP powder 17 gm mix with liquid twice daily as needed 09/05/21   [name], NP  sodium polystyrene (SPS) 15 GM/60ML suspension TAKE 63ms BY MOUTH ONCE A WEEK TO keep potassium down Patient taking differently: Take 15 g by mouth every Sunday. 07/14/21   ELauree Chandler NP  Vitamin D, Ergocalciferol, (DRISDOL) 1.25 MG (50000 UNIT) CAPS capsule TAKE 1 CAPSULE BY MOUTH every 7 days 10/12/21   KBrunetta Genera MD    Physical Exam: Vitals:   10/13/21 1800 10/13/21 1830 10/13/21 1900 10/13/21 1930  BP: 120/77 139/68 (!) 140/111 (!) 162/46  Pulse: 78 85 85 93  Resp: (!) 21 (!) 22 12 (!) 22  Temp:      TempSrc:      SpO2: 100% 100% 100% 100%  Weight:      Height:        Constitutional: Morbidly obese woman resting in bed with head elevated, NAD, calm, comfortable Eyes: EOMI, lids and conjunctivae normal ENMT: Mucous membranes are moist. Posterior pharynx clear of any exudate or lesions.Normal dentition.  Neck: normal, supple, no masses. Respiratory: Distant breath sounds with expiratory wheezing. Normal respiratory effort. No accessory muscle use.  Cardiovascular: Regular rate and rhythm, no murmurs / rubs / gallops.  +1 bilateral lower extremity edema.  Abdomen: no tenderness, no masses palpated.  Musculoskeletal: no clubbing / cyanosis. No joint deformity upper and lower extremities. Good ROM, no contractures. Normal muscle tone.  Skin: no rashes, lesions, ulcers. No induration Neurologic: Sensation intact. Strength 5/5 in all 4.  Psychiatric: Normal judgment and insight. Alert and oriented x 3. Normal mood.   EKG: Personally reviewed. Normal sinus rhythm without acute ischemic changes.  Assessment/Plan Principal Problem:   Acute on chronic heart failure with preserved ejection fraction (HFpEF) (HCC) Active Problems:   Coronary artery disease of native artery of native heart with stable angina pectoris (HCC)   COPD with chronic bronchitis (HCC)   Type 2 diabetes mellitus with diabetic neuropathy, with long-term current use of insulin (HCC)   Essential hypertension   Chronic kidney disease, stage 3a ([place]   Dyslipidemia associated with type 2 diabetes mellitus (HCC)   Chronic pain syndrome   Anxiety associated with depression   ENelida MeuseMRhetta Mura  is a 87 y.o. female with medical history significant for chronic HFpEF (EF 55-60% by TTE 07/05/2021), CAD (BMS to RCA 2004, DESx2 to mid and proximal RCA 2012, recent NSTEMI 02/2020 s/p DES to RCA), COPD, CKD stage IIIa, IDDM, HTN, HLD, anemia of chronic disease, prior PE/DVT, left breast cancer, depression/anxiety, chronic pain, dementia who is admitted with acute on chronic HFpEF.  Assessment and Plan: *  Acute on chronic heart failure with preserved ejection fraction (HFpEF) ([place]) Presenting with worsening orthopnea, DOE, peripheral edema compared to baseline.  Prescribed Lasix 20 mg only as needed as an outpatient.  Recent TTE 07/05/2021 showed EF 55-60%, G1DD. -Continue IV Lasix 40 mg BID -Strict I/O's and daily weights -Continue home Coreg 12.5 mg twice daily  Coronary artery disease of native artery of native heart with stable angina pectoris ([place]) Denies active chest pain.  Continue home aspirin, Plavix, atorvastatin, Imdur.  COPD with chronic bronchitis ([place]) Has some wheezing on admission.  Continue Dulera and albuterol as needed.  Type 2 diabetes mellitus with diabetic neuropathy, with long-term current use of insulin (HCC) Placed on Semglee 25 units daily with SSI.  Essential hypertension Continue home Coreg, Imdur.  Chronic kidney disease, stage 3a ([place]) Renal function stable, continue to monitor.  Anxiety associated with depression Continue home Cymbalta.  Chronic pain syndrome Continue Percocet q12h as needed.  Dyslipidemia associated with type 2 diabetes mellitus (HCC) Continue atorvastatin.  DVT prophylaxis: enoxaparin (LOVENOX) injection 40 mg Start: 10/13/21 2200 Code Status: DNR, confirmed with patient on admission Family Communication: Discussed with daughter at bedside Disposition Plan: From home and likely discharge to home pending clinical progress Consults called: None Severity of Illness: The appropriate patient status for this patient is OBSERVATION. Observation status is judged to be reasonable and necessary in order to provide the required intensity of service to ensure the patient's safety. [name] presenting symptoms, physical exam findings, and initial radiographic and laboratory data in the context of their medical condition is felt to place them at decreased risk for further clinical deterioration. Furthermore, it is anticipated that the patient  will be medically stable for discharge from the hospital within 2 midnights of admission.   Zada Finders MD [facility]  If 7PM-7AM, please contact night-coverage [email or web address]  10/13/2021, 8:05 PM

## 2021-10-14 ENCOUNTER — Encounter (HOSPITAL_COMMUNITY): Payer: Self-pay | Admitting: Internal Medicine

## 2021-10-14 ENCOUNTER — Encounter: Payer: Self-pay | Admitting: Hematology

## 2021-10-14 ENCOUNTER — Other Ambulatory Visit (HOSPITAL_COMMUNITY): Payer: Self-pay

## 2021-10-14 ENCOUNTER — Ambulatory Visit: Payer: HMO | Admitting: Family Medicine

## 2021-10-14 ENCOUNTER — Encounter (HOSPITAL_COMMUNITY): Payer: Self-pay

## 2021-10-14 DIAGNOSIS — E871 Hypo-osmolality and hyponatremia: Secondary | ICD-10-CM | POA: Diagnosis present

## 2021-10-14 DIAGNOSIS — K219 Gastro-esophageal reflux disease without esophagitis: Secondary | ICD-10-CM | POA: Diagnosis present

## 2021-10-14 DIAGNOSIS — E1140 Type 2 diabetes mellitus with diabetic neuropathy, unspecified: Secondary | ICD-10-CM | POA: Diagnosis present

## 2021-10-14 DIAGNOSIS — J449 Chronic obstructive pulmonary disease, unspecified: Secondary | ICD-10-CM | POA: Diagnosis not present

## 2021-10-14 DIAGNOSIS — D509 Iron deficiency anemia, unspecified: Secondary | ICD-10-CM | POA: Diagnosis present

## 2021-10-14 DIAGNOSIS — I25118 Atherosclerotic heart disease of native coronary artery with other forms of angina pectoris: Secondary | ICD-10-CM | POA: Diagnosis not present

## 2021-10-14 DIAGNOSIS — R059 Cough, unspecified: Secondary | ICD-10-CM | POA: Diagnosis not present

## 2021-10-14 DIAGNOSIS — G4733 Obstructive sleep apnea (adult) (pediatric): Secondary | ICD-10-CM | POA: Diagnosis present

## 2021-10-14 DIAGNOSIS — B9620 Unspecified Escherichia coli [E. coli] as the cause of diseases classified elsewhere: Secondary | ICD-10-CM | POA: Diagnosis present

## 2021-10-14 DIAGNOSIS — G894 Chronic pain syndrome: Secondary | ICD-10-CM | POA: Diagnosis not present

## 2021-10-14 DIAGNOSIS — R109 Unspecified abdominal pain: Secondary | ICD-10-CM | POA: Diagnosis not present

## 2021-10-14 DIAGNOSIS — F32A Depression, unspecified: Secondary | ICD-10-CM | POA: Diagnosis present

## 2021-10-14 DIAGNOSIS — R079 Chest pain, unspecified: Secondary | ICD-10-CM | POA: Diagnosis not present

## 2021-10-14 DIAGNOSIS — F418 Other specified anxiety disorders: Secondary | ICD-10-CM | POA: Diagnosis present

## 2021-10-14 DIAGNOSIS — E875 Hyperkalemia: Secondary | ICD-10-CM | POA: Diagnosis not present

## 2021-10-14 DIAGNOSIS — N179 Acute kidney failure, unspecified: Secondary | ICD-10-CM | POA: Diagnosis present

## 2021-10-14 DIAGNOSIS — E11649 Type 2 diabetes mellitus with hypoglycemia without coma: Secondary | ICD-10-CM | POA: Diagnosis present

## 2021-10-14 DIAGNOSIS — J189 Pneumonia, unspecified organism: Secondary | ICD-10-CM | POA: Diagnosis present

## 2021-10-14 DIAGNOSIS — J440 Chronic obstructive pulmonary disease with acute lower respiratory infection: Secondary | ICD-10-CM | POA: Diagnosis present

## 2021-10-14 DIAGNOSIS — I130 Hypertensive heart and chronic kidney disease with heart failure and stage 1 through stage 4 chronic kidney disease, or unspecified chronic kidney disease: Secondary | ICD-10-CM | POA: Diagnosis present

## 2021-10-14 DIAGNOSIS — E1169 Type 2 diabetes mellitus with other specified complication: Secondary | ICD-10-CM | POA: Diagnosis present

## 2021-10-14 DIAGNOSIS — E1122 Type 2 diabetes mellitus with diabetic chronic kidney disease: Secondary | ICD-10-CM | POA: Diagnosis present

## 2021-10-14 DIAGNOSIS — N1831 Chronic kidney disease, stage 3a: Secondary | ICD-10-CM | POA: Diagnosis present

## 2021-10-14 DIAGNOSIS — N390 Urinary tract infection, site not specified: Secondary | ICD-10-CM | POA: Diagnosis present

## 2021-10-14 DIAGNOSIS — I509 Heart failure, unspecified: Secondary | ICD-10-CM

## 2021-10-14 DIAGNOSIS — N189 Chronic kidney disease, unspecified: Secondary | ICD-10-CM | POA: Diagnosis not present

## 2021-10-14 DIAGNOSIS — E785 Hyperlipidemia, unspecified: Secondary | ICD-10-CM | POA: Diagnosis not present

## 2021-10-14 DIAGNOSIS — Z794 Long term (current) use of insulin: Secondary | ICD-10-CM | POA: Diagnosis not present

## 2021-10-14 DIAGNOSIS — J9811 Atelectasis: Secondary | ICD-10-CM | POA: Diagnosis not present

## 2021-10-14 DIAGNOSIS — I5033 Acute on chronic diastolic (congestive) heart failure: Secondary | ICD-10-CM | POA: Diagnosis present

## 2021-10-14 DIAGNOSIS — C50919 Malignant neoplasm of unspecified site of unspecified female breast: Secondary | ICD-10-CM | POA: Diagnosis present

## 2021-10-14 DIAGNOSIS — M353 Polymyalgia rheumatica: Secondary | ICD-10-CM | POA: Diagnosis present

## 2021-10-14 DIAGNOSIS — F0394 Unspecified dementia, unspecified severity, with anxiety: Secondary | ICD-10-CM | POA: Diagnosis present

## 2021-10-14 DIAGNOSIS — Z66 Do not resuscitate: Secondary | ICD-10-CM | POA: Diagnosis present

## 2021-10-14 DIAGNOSIS — I10 Essential (primary) hypertension: Secondary | ICD-10-CM | POA: Diagnosis not present

## 2021-10-14 DIAGNOSIS — Z6841 Body Mass Index (BMI) 40.0 and over, adult: Secondary | ICD-10-CM | POA: Diagnosis not present

## 2021-10-14 LAB — MAGNESIUM: Magnesium: 1.4 mg/dL — ABNORMAL LOW (ref 1.7–2.4)

## 2021-10-14 LAB — GLUCOSE, CAPILLARY
Glucose-Capillary: 197 mg/dL — ABNORMAL HIGH (ref 70–99)
Glucose-Capillary: 219 mg/dL — ABNORMAL HIGH (ref 70–99)
Glucose-Capillary: 256 mg/dL — ABNORMAL HIGH (ref 70–99)
Glucose-Capillary: 133 mg/dL — ABNORMAL HIGH (ref 70–99)

## 2021-10-14 LAB — CBC
HCT: 30.4 % — ABNORMAL LOW (ref 36.0–46.0)
Hemoglobin: 9.4 g/dL — ABNORMAL LOW (ref 12.0–15.0)
MCH: 29.7 pg (ref 26.0–34.0)
MCHC: 30.9 g/dL (ref 30.0–36.0)
MCV: 96.2 fL (ref 80.0–100.0)
Platelets: 238 10*3/uL (ref 150–400)
RBC: 3.16 MIL/uL — ABNORMAL LOW (ref 3.87–5.11)
RDW: 14.4 % (ref 11.5–15.5)
WBC: 16.2 10*3/uL — ABNORMAL HIGH (ref 4.0–10.5)
nRBC: 0 % (ref 0.0–0.2)

## 2021-10-14 LAB — BASIC METABOLIC PANEL
Anion gap: 7 (ref 5–15)
BUN: 21 mg/dL (ref 8–23)
CO2: 25 mmol/L (ref 22–32)
Calcium: 8.3 mg/dL — ABNORMAL LOW (ref 8.9–10.3)
Chloride: 106 mmol/L (ref 98–111)
Creatinine, Ser: 1.24 mg/dL — ABNORMAL HIGH (ref 0.44–1.00)
GFR, Estimated: 43 mL/min — ABNORMAL LOW (ref >60)
Glucose, Bld: 230 mg/dL — ABNORMAL HIGH (ref 70–99)
Potassium: 4.3 mmol/L (ref 3.5–5.1)
Sodium: 138 mmol/L (ref 135–145)

## 2021-10-14 LAB — MRSA NEXT GEN BY PCR, NASAL: MRSA by PCR Next Gen: NOT DETECTED

## 2021-10-14 MED ORDER — MAGNESIUM SULFATE 4 GM/100ML IV SOLN
4.0000 g | Freq: Once | INTRAVENOUS | Status: AC
Start: 2021-10-14 — End: 2021-10-14
  Administered 2021-10-14: 4 g via INTRAVENOUS
  Filled 2021-10-14 (×2): qty 100

## 2021-10-14 MED ORDER — POLYETHYLENE GLYCOL 3350 17 G PO PACK
17.0000 g | PACK | Freq: Every day | ORAL | Status: DC
  Administered 2021-10-14 – 2021-10-17 (×4): 17 g via ORAL
  Filled 2021-10-14 (×4): qty 1

## 2021-10-14 MED ORDER — IPRATROPIUM-ALBUTEROL 0.5-2.5 (3) MG/3ML IN SOLN
3.0000 mL | Freq: Two times a day (BID) | RESPIRATORY_TRACT | Status: DC
  Administered 2021-10-14 – 2021-10-19 (×10): 3 mL via RESPIRATORY_TRACT
  Filled 2021-10-14 (×10): qty 3

## 2021-10-14 MED ORDER — IPRATROPIUM-ALBUTEROL 0.5-2.5 (3) MG/3ML IN SOLN: 3.0000 mL | Freq: Four times a day (QID) | RESPIRATORY_TRACT | Status: DC

## 2021-10-14 MED ORDER — MELATONIN 3 MG PO TABS
3.0000 mg | ORAL_TABLET | Freq: Every evening | ORAL | Status: DC | PRN
  Administered 2021-10-14 – 2021-10-21 (×8): 3 mg via ORAL
  Filled 2021-10-14 (×8): qty 1

## 2021-10-14 MED ORDER — INSULIN GLARGINE-YFGN 100 UNIT/ML ~~LOC~~ SOLN
30.0000 [IU] | Freq: Every day | SUBCUTANEOUS | Status: DC
  Administered 2021-10-14 – 2021-10-22 (×9): 30 [IU] via SUBCUTANEOUS
  Filled 2021-10-14 (×9): qty 0.3

## 2021-10-14 MED ORDER — SENNOSIDES-DOCUSATE SODIUM 8.6-50 MG PO TABS
1.0000 | ORAL_TABLET | Freq: Two times a day (BID) | ORAL | Status: DC
  Administered 2021-10-14 – 2021-10-20 (×13): 1 via ORAL
  Filled 2021-10-14 (×16): qty 1

## 2021-10-14 NOTE — Progress Notes (Signed)
Heart Failure Stewardship Pharmacist Progress Note   PCP: Nichole Chandler, NP PCP-Cardiologist: Nichole Dawley, MD    HPI:  [name] with PMH of T2DM, COPD, CAD s/p DES to prox and mid RCA, HF, PE off anticoagulation with worsening anemia, breast cancer, and morbid obesity. She presented to the ED on 6/8 with abdominal pain, headache, shortness of breath, and orthopnea. CXR with vascular congestion and basilar atelectasis. Her last ECHO was done 2/28 and LVEF was 55-60% with G1DD.  Current HF Medications: Diuretic: furosemide 40 mg IV q12h Beta Blocker: carvedilol 12.5 mg BID Other: Imdur 60 mg daily  Prior to admission HF Medications: Diuretic: furosemide 20 mg daily PRN Beta blocker: carvedilol 12.5 mg BID Other: Imdur 60 mg daily  Pertinent Lab Values: Serum creatinine 1.24, BUN 21, Potassium 4.3, Sodium 138, BNP 116, Magnesium 1.4, A1c 7( 07/04/21)   Vital Signs: Weight: 311 lbs (admission weight: 311 lbs) Blood pressure: 120/70s  Heart rate: 80s  I/O: not recorded yesterday, -700 mL today  Medication Assistance / Insurance Benefits Check: Does the patient have prescription insurance?  Yes Type of insurance plan: [employer]  Does the patient qualify for medication assistance through manufacturers or grants?   Pending Eligible grants and/or patient assistance programs: pending Medication assistance applications in progress: none  Medication assistance applications approved: none Approved medication assistance renewals will be completed by: pending  Outpatient Pharmacy:  Prior to admission outpatient pharmacy: [facility] Is the patient willing to use [facility] pharmacy at discharge? Yes Is the patient willing to transition their outpatient pharmacy to utilize a [facility] outpatient pharmacy?   Pending    Assessment: 1. Acute on chronic diastolic CHF (LVEF 58-52%). NYHA class III symptoms. - Continue furosemide 40 mg IV q12h. Trend I/O and weight. Keep K>4 and  mag>2 - Continue carvedilol 12.5 mg BID - Consider adding SGLT2i with HFpEF - caution with being wheelchair bound. - Continue Imdur 60 mg daily   Plan: 1) Medication changes recommended at this time: - Continue IV diuresis  2) Patient assistance: - Appears she is in the donut hole - Copays currently are >$100 for Jardiance and Farxiga  - Will look into patient assistance options for her if these are added  3)  Education  - To be completed prior to discharge  [name], PharmD, BCPS Heart Failure Cytogeneticist Phone [phone]

## 2021-10-14 NOTE — Progress Notes (Signed)
Heart Failure Nurse Navigator Progress Note  PCP: [name], NP PCP-Cardiologist: Johney Frame Admission Diagnosis: Acute on chronic congestive heart failure Admitted from: Home  Presentation:   [name] presented with abdomen pain, shortness of breath,  BP 123/67, HR 80, BMI 53.87, bilateral lower extremity edema, BNP 116.4, EKG showed NSR, IV lasix x1 given. Baseline wheel chair bound, will stand and pivot for bed time and bathroom use. Lives with her daughter.   Patient and daughter educated on the sign and symptoms of heart failure, Diet and fluid restrictions, when to call her doctor or go to the ER. Medication compliance. Patient and daughter voiced their understanding of the education , scheduled for a hospital follow up per Dr. Broadus John for 10/25/21 @ 12 noon.   ECHO/ LVEF: 55-60% HFpEF  Clinical Course:  Past Medical History:  Diagnosis Date   Allergy    takes Mucinex daily as needed   Anemia, unspecified    Anxiety    takes Clonazepam daily as needed   Arthritis    Breast cancer ([place])    Chronic diastolic CHF (congestive heart failure) (HCC)    takes Furosemide daily   CKD (chronic kidney disease), stage III ([place])    Coronary artery disease    a. s/p IMI 2004 tx with BMS to RCA;  b. s/p Promus DES to RCA 2/12 c. abnormal nuc 2016 -> cath with med rx. d. NSTEMI 02/2020  mv CAD with severe ISR in pRCA and m/dRCA lesion both treated with DES.   Dementia ([place])    Depression    takes Cymbalta daily   Diabetes mellitus    insulin daily   GERD (gastroesophageal reflux disease)    takes Protonix daily   Gout, unspecified    Headache    occasionally   History of blood clots    Hyperlipidemia    takes Pravastatin daily   Hypertension    Insomnia    Joint pain    Joint swelling    Morbid obesity ([place])    Myocardial infarction ([place]) 2004   Non-STEMI (non-ST elevated myocardial infarction) ([place]) 02/15/2020   Numbness    Obstructive sleep apnea    does not  wear cpap   Osteoarthritis    Osteoarthritis    Osteoarthrosis, unspecified whether generalized or localized, lower leg    Pain, chronic    Polymyalgia rheumatica ([place])    Pulmonary emboli ([place]) 01/2012   felt to need lifelong anticoagulation but Xarelto dc in 02/2020 due to need for DAPT   Urinary incontinence    takes Linzess daily   Vertigo    hx of;was taking Meclizine if needed   Wears dentures      Social History   Socioeconomic History   Marital status: Widowed    Spouse name: Not on file   Number of children: 3   Years of education: Not on file   Highest education level: High school graduate  Occupational History   Occupation: retired  Tobacco Use   Smoking status: Never    Passive exposure: Past   Smokeless tobacco: Never   Tobacco comments:    Both parents smoked, patient was exposed/ "Raised up in smoke, my whole life."  Vaping Use   Vaping Use: Never used  Substance and Sexual Activity   Alcohol use: No    Alcohol/week: 0.0 standard drinks of alcohol   Drug use: No   Sexual activity: Not Currently  Other Topics Concern   Not on file  Social History Narrative   Not on file   Social Determinants of Health   Financial Resource Strain: Low Risk  (10/14/2021)   Overall Financial Resource Strain (CARDIA)    Difficulty of Paying Living Expenses: Not very hard  Food Insecurity: No Food Insecurity (10/14/2021)   Hunger Vital Sign    Worried About Running Out of Food in the Last Year: Never true    Ran Out of Food in the Last Year: Never true  Transportation Needs: No Transportation Needs (10/14/2021)   PRAPARE - Hydrologist (Medical): No    Lack of Transportation (Non-Medical): No  Physical Activity: Inactive (07/26/2017)   Exercise Vital Sign    Days of Exercise per Week: 0 days    Minutes of Exercise per Session: 0 min  Stress: Stress Concern Present (07/26/2017)   [place]    Feeling of Stress : Rather much  Social Connections: Moderately Integrated (07/26/2017)   Social Connection and Isolation Panel [NHANES]    Frequency of Communication with Friends and Family: Once a week    Frequency of Social Gatherings with Friends and Family: Three times a week    Attends Religious Services: 1 to 4 times per year    Active Member of Clubs or Organizations: Yes    Attends Archivist Meetings: 1 to 4 times per year    Marital Status: Widowed   .Education Assessment and Provision:  Detailed education and instructions provided on heart failure disease management including the following:  Signs and symptoms of Heart Failure When to call the physician Importance of daily weights Low sodium diet Fluid restriction Medication management Anticipated future follow-up appointments  Patient education given on each of the above topics.  Patient acknowledges understanding via teach back method and acceptance of all instructions.  Education Materials:  "Living Better With Heart Failure" Booklet, HF zone tool, & Daily Weight Tracker Tool.  Patient has scale at home: Yes, can't stand Patient has pill box at home: NA   High Risk Criteria for Readmission and/or Poor Patient Outcomes: Heart failure hospital admissions (last 6 months): 1  No Show rate: 9 % Difficult social situation: No Demonstrates medication adherence: yes Primary Language: English Literacy level: Reading, writing, and comprehension  Barriers of Care:   Daily weights (unable to stand for) Diet/ fluid restrictions)  Considerations/Referrals:   Referral made to Heart Failure Pharmacist Stewardship: yes Referral made to Heart Failure CSW/NCM TOC: No Referral made to Heart & Vascular TOC clinic: Yes, 10/25/21  Items for Follow-up on DC/TOC: Diet/fluid restrictions S/S of HF, unable to physically do daily weights   Earnestine Leys, BSN, RN Heart Failure Transport planner  Only

## 2021-10-14 NOTE — TOC Progression Note (Signed)
Transition of Care [facility]) - Progression Note    Patient Details  Name: CHARNA NEEB MRN: [id number] Date of Birth: 1935-04-25  Transition of Care [facility]) CM/SW Contact  Angelita Ingles, RN Phone Number:[phone]  10/14/2021, 3:52 PM  Clinical Narrative:     Transition of Care [facility]) Screening Note   Patient Details  Name: RAILEY GLAD Date of Birth: 03/08/1935   Transition of Care [facility]) CM/SW Contact:    Angelita Ingles, RN Phone Number: 10/14/2021, 3:52 PM    Transition of Care Department [facility]) has reviewed patient and no TOC needs have been identified at this time. We will continue to monitor patient advancement through interdisciplinary progression rounds. If new patient transition needs arise, please place a TOC consult.          Expected Discharge Plan and Services                                                 Social Determinants of Health (SDOH) Interventions Food Insecurity Interventions: Intervention Not Indicated Financial Strain Interventions: Intervention Not Indicated Housing Interventions: Intervention Not Indicated Transportation Interventions: Intervention Not Indicated  Readmission Risk Interventions    02/18/2020   11:52 AM 01/27/2019   12:07 PM  Readmission Risk Prevention Plan  Transportation Screening Complete Complete  Medication Review [employer]) Complete Complete  PCP or Specialist appointment within 3-5 days of discharge Complete Complete  HRI or Home Care Consult Complete Complete  SW Recovery Care/Counseling Consult Complete Complete  Palliative Care Screening Not Applicable Not [place] Not Applicable Not Applicable

## 2021-10-14 NOTE — Progress Notes (Signed)
PROGRESS NOTE    [name]  QQI:[id number] DOB: [birth year] DOA: 10/13/2021 PCP: [name], NP  87/F chronically ill morbidly obese female with history of chronic diastolic CHF, CAD,  NSTEMI 02/2020 s/p DES to RCA), COPD, CKD stage IIIa, IDDM, HTN, HLD, anemia of chronic disease, prior PE/DVT, left breast cancer, depression/anxiety, chronic pain, memory deficits who presented to the ED for evaluation of shortness of breath.  She is debilitated and wheelchair-bound at baseline, spends most of her time in bed, able to transfer to bedside commode for BMs. -Presented to the ED 6/8 with worsening dyspnea on exertion, orthopnea and cough.  Along with constipation.  She is on Lasix 20 Mg as needed at baseline, unsure if she was taking this as her daughter dispenses her medications. in the ED labs noted creatinine of 1.1, BNP 116, chest x-ray noted low lung volumes and pulmonary vascular congestion with basilar atelectasis  Subjective: -Tired, breathing starting to improve  Assessment and Plan:  Acute on chronic diastolic CHF  -At baseline on Lasix 20 Mg PRN only -Recent TTE 07/05/2021 showed EF 55-60%, G1DD. -Received Lasix x1 dose in the ED yesterday, continue IV Lasix 40 Mg twice daily, continue home dose of Coreg -Strict I/O's and daily weights -PT eval  Leukocytosis -She is afebrile with no other overt signs or symptoms of infection, continue to trend  Coronary artery disease of native artery of native heart with stable angina pectoris (HCC) -Stable, continue  aspirin, Plavix, atorvastatin, Imdur.  COPD with chronic bronchitis (HCC) -Some  audible wheezing noted, add DuoNebs, continue albuterol as needed, Mucinex  Type 2 diabetes mellitus with diabetic neuropathy, with long-term current use of insulin ([place]) -Poorly controlled we will increase Semglee dose  Essential hypertension Continue home Coreg, Imdur.  Chronic kidney disease, stage 3a ([place]) Renal function stable,  continue to monitor.  Anxiety associated with depression Continue home Cymbalta.  Chronic pain syndrome Continue Percocet q12h as needed.  Dyslipidemia associated with type 2 diabetes mellitus (HCC) Continue atorvastatin.  Constipation -Add Senokot and MiraLAX  Mild memory deficits -Continue Namenda she is AAOx3 this morning  DVT prophylaxis: Lovenox Code Status: DNR Family Communication: Discussed patient in detail, no family at bedside Disposition Plan: Home likely 2 to 3 days  Consultants:    Procedures:   Antimicrobials:    Objective: Vitals:   10/14/21 0734 10/14/21 0737 10/14/21 0800 10/14/21 1118  BP:   (!) 126/32 131/70  Pulse:   90 83  Resp:   20 20  Temp:   98 F (36.7 C) 97.7 F (36.5 C)  TempSrc:   Oral Oral  SpO2: 100% 100% 98% 99%  Weight:      Height:        Intake/Output Summary (Last 24 hours) at 10/14/2021 1122 Last data filed at 10/14/2021 0800 Gross per 24 hour  Intake 236 ml  Output 100 ml  Net 136 ml   Filed Weights   10/13/21 1717 10/13/21 2248 10/14/21 0336  Weight: (!) 156 kg (!) 141.3 kg (!) 141.3 kg    Examination:  General exam: Obese chronically ill female sitting up in bed, AAO x3, no distress HEENT: Positive JVD CVS: S1-S2, regular rate rhythm Lungs: Few basilar rales Abdomen: Soft, obese, nontender, bowel sounds present Extremities: 1+ edema Skin: No rashes Psychiatry:  Mood & affect appropriate.     Data Reviewed:   CBC: Recent Labs  Lab 10/13/21 1107 10/14/21 0221  WBC 16.1* 16.2*  NEUTROABS 12.5*  --  HGB 10.2* 9.4*  HCT 32.4* 30.4*  MCV 96.1 96.2  PLT 254 716   Basic Metabolic Panel: Recent Labs  Lab 10/13/21 1107 10/14/21 0221  NA 141 138  K 4.3 4.3  CL 108 106  CO2 26 25  GLUCOSE 198* 230*  BUN 18 21  CREATININE 1.13* 1.24*  CALCIUM 8.4* 8.3*  MG  --  1.4*   GFR: Estimated Creatinine Clearance: 49.8 mL/min (A) (by C-G formula based on SCr of 1.24 mg/dL (H)). Liver Function  Tests: Recent Labs  Lab 10/13/21 1107  AST 10*  ALT 9  ALKPHOS 88  BILITOT 0.4  PROT 7.2  ALBUMIN 2.9*   Recent Labs  Lab 10/13/21 1107  LIPASE 30   No results for input(s): "AMMONIA" in the last 168 hours. Coagulation Profile: No results for input(s): "INR", "PROTIME" in the last 168 hours. Cardiac Enzymes: No results for input(s): "CKTOTAL", "CKMB", "CKMBINDEX", "TROPONINI" in the last 168 hours. BNP (last 3 results) No results for input(s): "PROBNP" in the last 8760 hours. HbA1C: No results for input(s): "HGBA1C" in the last 72 hours. CBG: Recent Labs  Lab 10/13/21 1526 10/13/21 2309 10/14/21 0607  GLUCAP 173* 246* 197*   Lipid Profile: No results for input(s): "CHOL", "HDL", "LDLCALC", "TRIG", "CHOLHDL", "LDLDIRECT" in the last 72 hours. Thyroid Function Tests: No results for input(s): "TSH", "T4TOTAL", "FREET4", "T3FREE", "THYROIDAB" in the last 72 hours. Anemia Panel: No results for input(s): "VITAMINB12", "FOLATE", "FERRITIN", "TIBC", "IRON", "RETICCTPCT" in the last 72 hours. Urine analysis:    Component Value Date/Time   COLORURINE YELLOW 10/13/2021 1908   APPEARANCEUR HAZY (A) 10/13/2021 1908   LABSPEC 1.016 10/13/2021 1908   PHURINE 5.0 10/13/2021 1908   GLUCOSEU NEGATIVE 10/13/2021 1908   HGBUR NEGATIVE 10/13/2021 1908   BILIRUBINUR NEGATIVE 10/13/2021 1908   BILIRUBINUR neg 06/03/2019 1116   KETONESUR NEGATIVE 10/13/2021 1908   PROTEINUR 100 (A) 10/13/2021 1908   UROBILINOGEN 0.2 06/03/2019 1116   UROBILINOGEN 0.2 02/16/2018 1514   NITRITE NEGATIVE 10/13/2021 1908   LEUKOCYTESUR SMALL (A) 10/13/2021 1908   Sepsis Labs: '@LABRCNTIP'$ (procalcitonin:4,lacticidven:4)  ) Recent Results (from the past 240 hour(s))  MRSA Next Gen by PCR, Nasal     Status: None   Collection Time: 10/13/21 10:48 PM   Specimen: Nasal Mucosa; Nasal Swab  Result Value Ref Range Status   MRSA by PCR Next Gen NOT DETECTED NOT DETECTED Final    Comment: (NOTE) The  GeneXpert MRSA Assay (FDA approved for NASAL specimens only), is one component of a comprehensive MRSA colonization surveillance program. It is not intended to diagnose MRSA infection nor to guide or monitor treatment for MRSA infections. Test performance is not FDA approved in patients less than 48 years old. Performed at [facility], [place] [address], [place], [place] [address]      Radiology Studies: DG Chest 2 View  Result Date: 10/13/2021 CLINICAL DATA:  Shortness of breath and abdominal pain EXAM: CHEST - 2 VIEW COMPARISON:  07/04/2021 FINDINGS: Similar low lung volumes with some degree of residual central vascular congestion and basilar atelectasis. No current CHF pattern or definite pneumonia. Negative for effusion or pneumothorax. Trachea midline. Normal heart size. Aorta atherosclerotic. Degenerative changes noted of the spine and left shoulder. IMPRESSION: Low volume exam with vascular congestion and basilar atelectasis. Electronically Signed   By: Jerilynn Mages.  Shick M.D.   On: 10/13/2021 11:44     Scheduled Meds:  anastrozole  1 mg Oral Daily   aspirin EC  81  mg Oral Daily   atorvastatin  80 mg Oral QHS   carvedilol  12.5 mg Oral BID   clopidogrel  75 mg Oral Daily   DULoxetine  60 mg Oral Daily   enoxaparin (LOVENOX) injection  40 mg Subcutaneous Q24H   febuxostat  40 mg Oral Daily   furosemide  40 mg Intravenous Q12H   insulin aspart  0-20 Units Subcutaneous TID WC   insulin glargine-yfgn  30 Units Subcutaneous Daily   ipratropium-albuterol  3 mL Nebulization BID   isosorbide mononitrate  60 mg Oral Daily   memantine  10 mg Oral Daily   memantine  5 mg Oral QHS   mometasone-formoterol  2 puff Inhalation BID   pantoprazole  20 mg Oral QAC breakfast   polyethylene glycol  17 g Oral Daily   senna-docusate  1 tablet Oral BID   sodium chloride flush  3 mL Intravenous Q12H   Continuous Infusions:  magnesium sulfate bolus IVPB 4 g (10/14/21 0951)     LOS: 0 days    Time  spent: 21mn  PDomenic Polite MD [facility]   10/14/2021, 11:22 AM

## 2021-10-15 DIAGNOSIS — I5033 Acute on chronic diastolic (congestive) heart failure: Secondary | ICD-10-CM | POA: Diagnosis not present

## 2021-10-15 LAB — CBC
HCT: 30.8 % — ABNORMAL LOW (ref 36.0–46.0)
Hemoglobin: 9.4 g/dL — ABNORMAL LOW (ref 12.0–15.0)
MCH: 29.5 pg (ref 26.0–34.0)
MCHC: 30.5 g/dL (ref 30.0–36.0)
MCV: 96.6 fL (ref 80.0–100.0)
Platelets: 224 10*3/uL (ref 150–400)
RBC: 3.19 MIL/uL — ABNORMAL LOW (ref 3.87–5.11)
RDW: 14.2 % (ref 11.5–15.5)
WBC: 14.5 10*3/uL — ABNORMAL HIGH (ref 4.0–10.5)
nRBC: 0 % (ref 0.0–0.2)

## 2021-10-15 LAB — BASIC METABOLIC PANEL
Anion gap: 8 (ref 5–15)
BUN: 25 mg/dL — ABNORMAL HIGH (ref 8–23)
CO2: 26 mmol/L (ref 22–32)
Calcium: 8.4 mg/dL — ABNORMAL LOW (ref 8.9–10.3)
Chloride: 101 mmol/L (ref 98–111)
Creatinine, Ser: 1.58 mg/dL — ABNORMAL HIGH (ref 0.44–1.00)
GFR, Estimated: 32 mL/min — ABNORMAL LOW (ref >60)
Glucose, Bld: 152 mg/dL — ABNORMAL HIGH (ref 70–99)
Potassium: 4.6 mmol/L (ref 3.5–5.1)
Sodium: 135 mmol/L (ref 135–145)

## 2021-10-15 LAB — GLUCOSE, CAPILLARY
Glucose-Capillary: 157 mg/dL — ABNORMAL HIGH (ref 70–99)
Glucose-Capillary: 261 mg/dL — ABNORMAL HIGH (ref 70–99)
Glucose-Capillary: 241 mg/dL — ABNORMAL HIGH (ref 70–99)
Glucose-Capillary: 300 mg/dL — ABNORMAL HIGH (ref 70–99)

## 2021-10-15 MED ORDER — MENTHOL 3 MG MT LOZG
1.0000 | LOZENGE | OROMUCOSAL | Status: DC | PRN
  Administered 2021-10-15: 24 mg via ORAL
  Administered 2021-10-18 – 2021-10-20 (×2): 3 mg via ORAL
  Filled 2021-10-15: qty 9

## 2021-10-15 MED ORDER — GUAIFENESIN-DM 100-10 MG/5ML PO SYRP
5.0000 mL | ORAL_SOLUTION | ORAL | Status: DC | PRN
  Administered 2021-10-15 – 2021-10-18 (×4): 5 mL via ORAL
  Filled 2021-10-15 (×5): qty 5

## 2021-10-15 NOTE — Progress Notes (Signed)
PROGRESS NOTE    [name]  IOX:[id number] DOB: April 20, 1935 DOA: 10/13/2021 PCP: [name], NP  86/F chronically ill morbidly obese female with history of chronic diastolic CHF, CAD,  NSTEMI 02/2020 s/p DES to RCA), COPD, CKD stage IIIa, IDDM, HTN, HLD, anemia of chronic disease, prior PE/DVT, left breast cancer, depression/anxiety, chronic pain, memory deficits who presented to the ED for evaluation of shortness of breath.  She is debilitated and wheelchair-bound at baseline, spends most of her time in bed, able to transfer to bedside commode for BMs. -Presented to the ED 6/8 with worsening dyspnea on exertion, orthopnea and cough.  Along with constipation.  She is on Lasix 20 Mg as needed at baseline, unsure if she was taking this as her daughter dispenses her medications. in the ED labs noted creatinine of 1.1, BNP 116, chest x-ray noted low lung volumes and pulmonary vascular congestion with basilar atelectasis  Subjective: -Feels okay overall, breathing improving, off oxygen  Assessment and Plan:  Acute on chronic diastolic CHF  -At baseline on Lasix 20 Mg PRN only -Recent TTE 07/05/2021 showed EF 55-60%, G1DD. -Continue IV Lasix 40 Mg twice daily today, mild bump in creatinine noted, still in baseline range of 1.6-1.7 -I do not think she is a good SGLT2 candidate with obesity and wheelchair-bound status -Ambulate, PT OT -Urine output is inaccurate and unable to get standing weights -BMP in a.m.  Leukocytosis -She is afebrile with no other overt signs or symptoms of infection, improving, monitor -Reported some dysuria yesterday, UA was unremarkable we will check urine culture, improving  Morbid obesity Debility, wheelchair-bound status -Poor prognostic factors, check PT eval  Coronary artery disease of native artery of native heart with stable angina pectoris (HCC) -Stable, continue  aspirin, Plavix, atorvastatin, Imdur.  COPD with chronic bronchitis ([place]) -Had  audible wheezing yesterday, now improved, continue DuoNebs, albuterol  Type 2 diabetes mellitus with diabetic neuropathy, with long-term current use of insulin (HCC) -Improving continue current dose of Semglee  Essential hypertension Continue home Coreg, Imdur.  Chronic kidney disease, stage 3a (HCC) -Baseline creatinine around 1.6, 1.7 monitor with diuresis  Anxiety associated with depression Continue home Cymbalta.  Chronic pain syndrome Continue Percocet q12h as needed.  Dyslipidemia associated with type 2 diabetes mellitus (HCC) Continue atorvastatin.  Constipation -Continue laxatives today  Mild memory deficits -Continue Namenda she is AAOx3  DVT prophylaxis: Lovenox Code Status: DNR Family Communication: Discussed patient in detail, no family at bedside Disposition Plan: Home likely 1 to 2 days  Consultants:    Procedures:   Antimicrobials:    Objective: Vitals:   10/15/21 0400 10/15/21 0500 10/15/21 0606 10/15/21 0804  BP:   (!) 151/68 (!) 131/56  Pulse: 71 73 81 86  Resp: (!) 21 (!) 21 20 (!) 22  Temp:   98.3 F (36.8 C) 98.6 F (37 C)  TempSrc:   Oral Oral  SpO2: 100% 100% 100% 98%  Weight:   (!) 143.2 kg   Height:        Intake/Output Summary (Last 24 hours) at 10/15/2021 1014 Last data filed at 10/15/2021 0700 Gross per 24 hour  Intake 940 ml  Output 1700 ml  Net -760 ml   Filed Weights   10/13/21 2248 10/14/21 0336 10/15/21 0606  Weight: (!) 141.3 kg (!) 141.3 kg (!) 143.2 kg    Examination:  General exam: Obese chronically ill female sitting up in bed, AAOx3, no distress HEENT: Positive JVD CVS: S1-S2, regular rhythm Lungs: Rare  basilar rales Abdomen: Soft, obese, nontender, bowel sounds present Extremities: Trace edema Skin: No rashes Psychiatry:  Mood & affect appropriate.     Data Reviewed:   CBC: Recent Labs  Lab 10/13/21 1107 10/14/21 0221 10/15/21 0316  WBC 16.1* 16.2* 14.5*  NEUTROABS 12.5*  --   --   HGB 10.2*  9.4* 9.4*  HCT 32.4* 30.4* 30.8*  MCV 96.1 96.2 96.6  PLT 254 238 025   Basic Metabolic Panel: Recent Labs  Lab 10/13/21 1107 10/14/21 0221 10/15/21 0316  NA 141 138 135  K 4.3 4.3 4.6  CL 108 106 101  CO2 '26 25 26  '$ GLUCOSE 198* 230* 152*  BUN 18 21 25*  CREATININE 1.13* 1.24* 1.58*  CALCIUM 8.4* 8.3* 8.4*  MG  --  1.4*  --    GFR: Estimated Creatinine Clearance: 38.7 mL/min (A) (by C-G formula based on SCr of 1.58 mg/dL (H)). Liver Function Tests: Recent Labs  Lab 10/13/21 1107  AST 10*  ALT 9  ALKPHOS 88  BILITOT 0.4  PROT 7.2  ALBUMIN 2.9*   Recent Labs  Lab 10/13/21 1107  LIPASE 30   No results for input(s): "AMMONIA" in the last 168 hours. Coagulation Profile: No results for input(s): "INR", "PROTIME" in the last 168 hours. Cardiac Enzymes: No results for input(s): "CKTOTAL", "CKMB", "CKMBINDEX", "TROPONINI" in the last 168 hours. BNP (last 3 results) No results for input(s): "PROBNP" in the last 8760 hours. HbA1C: No results for input(s): "HGBA1C" in the last 72 hours. CBG: Recent Labs  Lab 10/14/21 0607 10/14/21 1125 10/14/21 1552 10/14/21 2141 10/15/21 0617  GLUCAP 197* 219* 256* 133* 157*   Lipid Profile: No results for input(s): "CHOL", "HDL", "LDLCALC", "TRIG", "CHOLHDL", "LDLDIRECT" in the last 72 hours. Thyroid Function Tests: No results for input(s): "TSH", "T4TOTAL", "FREET4", "T3FREE", "THYROIDAB" in the last 72 hours. Anemia Panel: No results for input(s): "VITAMINB12", "FOLATE", "FERRITIN", "TIBC", "IRON", "RETICCTPCT" in the last 72 hours. Urine analysis:    Component Value Date/Time   COLORURINE YELLOW 10/13/2021 1908   APPEARANCEUR HAZY (A) 10/13/2021 1908   LABSPEC 1.016 10/13/2021 1908   PHURINE 5.0 10/13/2021 1908   GLUCOSEU NEGATIVE 10/13/2021 1908   HGBUR NEGATIVE 10/13/2021 1908   BILIRUBINUR NEGATIVE 10/13/2021 1908   BILIRUBINUR neg 06/03/2019 1116   KETONESUR NEGATIVE 10/13/2021 1908   PROTEINUR 100 (A)  10/13/2021 1908   UROBILINOGEN 0.2 06/03/2019 1116   UROBILINOGEN 0.2 02/16/2018 1514   NITRITE NEGATIVE 10/13/2021 1908   LEUKOCYTESUR SMALL (A) 10/13/2021 1908   Sepsis Labs: '@LABRCNTIP'$ (procalcitonin:4,lacticidven:4)  ) Recent Results (from the past 240 hour(s))  MRSA Next Gen by PCR, Nasal     Status: None   Collection Time: 10/13/21 10:48 PM   Specimen: Nasal Mucosa; Nasal Swab  Result Value Ref Range Status   MRSA by PCR Next Gen NOT DETECTED NOT DETECTED Final    Comment: (NOTE) The GeneXpert MRSA Assay (FDA approved for NASAL specimens only), is one component of a comprehensive MRSA colonization surveillance program. It is not intended to diagnose MRSA infection nor to guide or monitor treatment for MRSA infections. Test performance is not FDA approved in patients less than 46 years old. Performed at [facility], [place] [address], [place], [place] [address]      Radiology Studies: DG Chest 2 View  Result Date: 10/13/2021 CLINICAL DATA:  Shortness of breath and abdominal pain EXAM: CHEST - 2 VIEW COMPARISON:  07/04/2021 FINDINGS: Similar low lung volumes with some degree of  residual central vascular congestion and basilar atelectasis. No current CHF pattern or definite pneumonia. Negative for effusion or pneumothorax. Trachea midline. Normal heart size. Aorta atherosclerotic. Degenerative changes noted of the spine and left shoulder. IMPRESSION: Low volume exam with vascular congestion and basilar atelectasis. Electronically Signed   By: Jerilynn Mages.  Shick M.D.   On: 10/13/2021 11:44     Scheduled Meds:  anastrozole  1 mg Oral Daily   aspirin EC  81 mg Oral Daily   atorvastatin  80 mg Oral QHS   carvedilol  12.5 mg Oral BID   clopidogrel  75 mg Oral Daily   DULoxetine  60 mg Oral Daily   enoxaparin (LOVENOX) injection  40 mg Subcutaneous Q24H   febuxostat  40 mg Oral Daily   furosemide  40 mg Intravenous Q12H   insulin aspart  0-20 Units Subcutaneous TID WC   insulin  glargine-yfgn  30 Units Subcutaneous Daily   ipratropium-albuterol  3 mL Nebulization BID   isosorbide mononitrate  60 mg Oral Daily   memantine  10 mg Oral Daily   memantine  5 mg Oral QHS   mometasone-formoterol  2 puff Inhalation BID   pantoprazole  20 mg Oral QAC breakfast   polyethylene glycol  17 g Oral Daily   senna-docusate  1 tablet Oral BID   sodium chloride flush  3 mL Intravenous Q12H   Continuous Infusions:     LOS: 1 day    Time spent: 36mn  PDomenic Polite MD [facility]   10/15/2021, 10:14 AM

## 2021-10-15 NOTE — Progress Notes (Signed)
Urine culture added to the urine already in the lab as patient is using a Purewick.  Spoke with Philippa Chester in the lab

## 2021-10-15 NOTE — Plan of Care (Signed)
Problem: Education: Goal: Ability to demonstrate management of disease process will improve Outcome: Progressing Goal: Ability to verbalize understanding of medication therapies will improve Outcome: Progressing Goal: Individualized Educational Video(s) Outcome: Progressing   Problem: Activity: Goal: Capacity to carry out activities will improve Outcome: Progressing   Problem: Cardiac: Goal: Ability to achieve and maintain adequate cardiopulmonary perfusion will improve Outcome: Progressing   Problem: Education: Goal: Ability to demonstrate management of disease process will improve Outcome: Progressing Goal: Ability to verbalize understanding of medication therapies will improve Outcome: Progressing Goal: Individualized Educational Video(s) Outcome: Progressing   Problem: Activity: Goal: Capacity to carry out activities will improve Outcome: Progressing   Problem: Cardiac: Goal: Ability to achieve and maintain adequate cardiopulmonary perfusion will improve Outcome: Progressing   Problem: Education: Goal: Ability to describe self-care measures that may prevent or decrease complications (Diabetes Survival Skills Education) will improve Outcome: Progressing Goal: Individualized Educational Video(s) Outcome: Progressing   Problem: Coping: Goal: Ability to adjust to condition or change in health will improve Outcome: Progressing   Problem: Fluid Volume: Goal: Ability to maintain a balanced intake and output will improve Outcome: Progressing   Problem: Health Behavior/Discharge Planning: Goal: Ability to identify and utilize available resources and services will improve Outcome: Progressing Goal: Ability to manage health-related needs will improve Outcome: Progressing   Problem: Metabolic: Goal: Ability to maintain appropriate glucose levels will improve Outcome: Progressing   Problem: Nutritional: Goal: Maintenance of adequate nutrition will improve Outcome:  Progressing Goal: Progress toward achieving an optimal weight will improve Outcome: Progressing   Problem: Skin Integrity: Goal: Risk for impaired skin integrity will decrease Outcome: Progressing   Problem: Tissue Perfusion: Goal: Adequacy of tissue perfusion will improve Outcome: Progressing   Problem: Education: Goal: Knowledge of General Education information will improve Description: Including pain rating scale, medication(s)/side effects and non-pharmacologic comfort measures Outcome: Progressing   Problem: Health Behavior/Discharge Planning: Goal: Ability to manage health-related needs will improve Outcome: Progressing   Problem: Clinical Measurements: Goal: Ability to maintain clinical measurements within normal limits will improve Outcome: Progressing Goal: Will remain free from infection Outcome: Progressing Goal: Diagnostic test results will improve Outcome: Progressing Goal: Respiratory complications will improve Outcome: Progressing Goal: Cardiovascular complication will be avoided Outcome: Progressing   Problem: Nutrition: Goal: Adequate nutrition will be maintained Outcome: Progressing   Problem: Activity: Goal: Risk for activity intolerance will decrease Outcome: Progressing   Problem: Coping: Goal: Level of anxiety will decrease Outcome: Progressing   Problem: Elimination: Goal: Will not experience complications related to bowel motility Outcome: Progressing Goal: Will not experience complications related to urinary retention Outcome: Progressing   Problem: Pain Managment: Goal: General experience of comfort will improve Outcome: Progressing   Problem: Safety: Goal: Ability to remain free from injury will improve Outcome: Progressing   Problem: Skin Integrity: Goal: Risk for impaired skin integrity will decrease Outcome: Progressing   Problem: Education: Goal: Knowledge of General Education information will improve Description:  Including pain rating scale, medication(s)/side effects and non-pharmacologic comfort measures Outcome: Progressing   Problem: Health Behavior/Discharge Planning: Goal: Ability to manage health-related needs will improve Outcome: Progressing   Problem: Clinical Measurements: Goal: Ability to maintain clinical measurements within normal limits will improve Outcome: Progressing Goal: Will remain free from infection Outcome: Progressing Goal: Diagnostic test results will improve Outcome: Progressing Goal: Respiratory complications will improve Outcome: Progressing Goal: Cardiovascular complication will be avoided Outcome: Progressing   Problem: Activity: Goal: Risk for activity intolerance will decrease Outcome: Progressing   Problem: Nutrition: Goal: Adequate  nutrition will be maintained Outcome: Progressing   Problem: Coping: Goal: Level of anxiety will decrease Outcome: Progressing

## 2021-10-15 NOTE — Progress Notes (Signed)
Patient unable to stand for weight; attempted.  Bed weight obtained.

## 2021-10-15 NOTE — Social Work (Addendum)
CSW spoke with pt in regards to assisted living resources. CSW provided a list of local ALF as well as a hand out for "A place for mom". CSW encouraged pt to follow up with the resources. CSW explained that PT order is in and that they should be doing an evaluation and CSW briefly discussed SNF with pt and let her know that PT would need to make the recommendation.

## 2021-10-15 NOTE — TOC Progression Note (Signed)
Transition of Care [facility]) - Progression Note    Patient Details  Name: [name] MRN: [id number] Date of Birth: Jul 16, 1934  [facility]) CM/SW Contact  Tiajuana Amass [place], [place] Phone Number: [phone] 10/15/2021, 2:36 PM  Clinical Narrative:  [facility] team for discharge planning. Spoke with patient regarding her request for assisted living resources. Call was brief as pt. Was coughing, wheezing and short of breath. Daughter is at bedside. Collaborated with Fredderick Phenix.- MSW that will provide patient with resources. Will continue to monitor for discharge needs.         Expected Discharge Plan and Services                                                 Social Determinants of Health (SDOH) Interventions Food Insecurity Interventions: Intervention Not Indicated Financial Strain Interventions: Intervention Not Indicated Housing Interventions: Intervention Not Indicated Transportation Interventions: Intervention Not Indicated  Readmission Risk Interventions    02/18/2020   11:52 AM 01/27/2019   12:07 PM  Readmission Risk Prevention Plan  Transportation Screening Complete Complete  Medication Review Press photographer) Complete Complete  PCP or Specialist appointment within 3-5 days of discharge Complete Complete  HRI or Home Care Consult Complete Complete  SW Recovery Care/Counseling Consult Complete Complete  Palliative Care Screening Not Applicable Not [place] Not Applicable Not Applicable

## 2021-10-15 NOTE — Evaluation (Addendum)
Physical Therapy Evaluation Patient Details Name: [name] MRN: [id number] DOB: 06/10/1934 Today's Date: 10/15/2021  History of Present Illness  Pt is an 87 y.o. female who presented 10/13/21 for SOB and hypoxia secondary to acute on chronic heart failure with preserved ejection fraction. PMH: chronic HFpEF (EF 55-60% by TTE 07/05/2021), CAD (BMS to RCA 2004, DESx2 to mid and proximal RCA 2012, recent NSTEMI 02/2020 s/p DES to RCA), COPD, CKD stage IIIa, IDDM, HTN, HLD, anemia of chronic disease, prior PE/DVT, left breast cancer, depression/anxiety, chronic pain, dementia   Clinical Impression  Pt presents with condition above and deficits mentioned below, see PT Problem List. PTA, she was living with her daughter in a house with a ramped entrance, relying on her daughter to assist with all bed mobility, transfers, w/c mobility, and ADLs. Currently, pt is able to perform bed mobility with HOB elevated, transfer to stand, and step a few times laterally to transfer between surfaces while holding onto furniture at a minA level. Pt needs extra time for all tasks due to decreased activity tolerance/aerobic endurance. Pt also displays deficits in balance and strength that place her at risk for falls. Pt inquiring about potentially wanting to go to an ALF to decrease burden of care on her daughter, consulted case manager and social worker in regards to providing pt with these resources. As pt appears to have the level of care she needs at home, recommending pt follow-up with HHPT. Recommending electric w/c and hospital bed to reduce burden of care, increased pt independence, and thus improve pt quality of life. Will continue to follow acutely.     Recommendations for follow up therapy are one component of a multi-disciplinary discharge planning process, led by the attending physician.  Recommendations may be updated based on patient status, additional functional criteria and insurance  authorization.  Follow Up Recommendations Home health PT    Assistance Recommended at Discharge Intermittent Supervision/Assistance  Patient can return home with the following  A little help with walking and/or transfers;A little help with bathing/dressing/bathroom;Assistance with cooking/housework;Assist for transportation;Help with stairs or ramp for entrance    Equipment Recommendations Hospital bed;Other (comment) (electric w/c)  Recommendations for Other Services       Functional Status Assessment Patient has had a recent decline in their functional status and demonstrates the ability to make significant improvements in function in a reasonable and predictable amount of time.     Precautions / Restrictions Precautions Precautions: Fall;Other (comment) Precaution Comments: watch SpO2 Restrictions Weight Bearing Restrictions: No      Mobility  Bed Mobility Overal bed mobility: Needs Assistance Bed Mobility: Supine to Sit     Supine to sit: Min assist, HOB elevated     General bed mobility comments: Extra time, several rest breaks, and minA to ascend trunk and scoot to EOB with HOB elevated.    Transfers Overall transfer level: Needs assistance Equipment used:  (hands on furniture) Transfers: Sit to/from Stand, Bed to chair/wheelchair/BSC Sit to Stand: Min assist   Step pivot transfers: Min assist       General transfer comment: Pt requiring minA to come to a full stand from EOB and stand from recliner, pulling/pushing up on arm rests of recliner or on bars of commode when coming to stand from EOB. MinA to steady with stand step to L bed > recliner with R hand on bar of commode and L transitioning from bed > L arm rest of recliner.    Ambulation/Gait Ambulation/Gait assistance: Min  assist Gait Distance (Feet): 1 Feet Assistive device:  (furniture support) Gait Pattern/deviations: Step-through pattern, Decreased stride length, Shuffle, Trunk flexed Gait  velocity: reduced Gait velocity interpretation: <1.31 ft/sec, indicative of household ambulator   General Gait Details: Pt with slow, small, shuffling, unsteady steps to L with R hand on commode bar and L on bed or recliner arm rest for support, minA for stability  Stairs            Wheelchair Mobility    Modified Rankin (Stroke Patients Only)       Balance Overall balance assessment: Needs assistance Sitting-balance support: Single extremity supported, Bilateral upper extremity supported, Feet supported, No upper extremity supported Sitting balance-Leahy Scale: Fair Sitting balance - Comments: Able to sit statically EOB without UE support   Standing balance support: Bilateral upper extremity supported, Single extremity supported, During functional activity Standing balance-Leahy Scale: Poor Standing balance comment: Reliant on UE support and up to minA                             Pertinent Vitals/Pain Pain Assessment Pain Assessment: Faces Faces Pain Scale: Hurts little more Pain Location: knees, R finger Pain Descriptors / Indicators: Discomfort Pain Intervention(s): Limited activity within patient's tolerance, Monitored during session, Repositioned    Home Living Family/patient expects to be discharged to:: Private residence Living Arrangements: Children (daughter) Available Help at Discharge: Family;Available 24 hours/day Type of Home: House Home Access: Ramped entrance       Home Layout: One level (several stairs between rooms in house, so pt limited to her bedroom part of the house) Home Equipment: [place] (2 wheels);Wheelchair - manual;BSC/3in1;Shower seat;Grab bars - tub/shower;Hand held shower head;Other (comment) (mechanical bed, but have tried multiple rail options without success)      Prior Function Prior Level of Function : Needs assist       Physical Assist : Mobility (physical);ADLs (physical) Mobility (physical): Bed  mobility;Transfers ADLs (physical): Grooming;Bathing;Dressing;Toileting;IADLs Mobility Comments: Uses WC mainly, but does have RW for short distance ambulation, but reports "I cannot tell you when" she last walked a bit. Reports she cannot push her own w/c and thus someone else has to push her around the house. Needs assistance for rolling and all bed mobility and stand step/pivot transfers. ADLs Comments: Requires assist for ADLs.     Hand Dominance   Dominant Hand: Right    Extremity/Trunk Assessment   Upper Extremity Assessment Upper Extremity Assessment: Defer to OT evaluation    Lower Extremity Assessment Lower Extremity Assessment: Generalized weakness    Cervical / Trunk Assessment Cervical / Trunk Assessment: Other exceptions Cervical / Trunk Exceptions: increased body habitus  Communication   Communication: No difficulties  Cognition Arousal/Alertness: Awake/alert Behavior During Therapy: WFL for tasks assessed/performed Overall Cognitive Status: Within Functional Limits for tasks assessed                                 General Comments: Able to tell PT how she wants furniture organized for functional mobility tasks during session        General Comments General comments (skin integrity, edema, etc.): SpO2 >/= 93% on RA when waveforms were consistent; pt reporting desire for info on ALFs, consulted case manager and social worker to provide pt with resources    Exercises     Assessment/Plan    PT Assessment Patient needs continued PT services  PT Problem List Decreased strength;Decreased activity tolerance;Decreased balance;Decreased mobility;Cardiopulmonary status limiting activity;Obesity       PT Treatment Interventions DME instruction;Gait training;Functional mobility training;Therapeutic activities;Therapeutic exercise;Balance training;Neuromuscular re-education;Patient/family education    PT Goals (Current goals can be found in the Care  Plan section)  Acute Rehab PT Goals Patient Stated Goal: to maybe go to an ALF PT Goal Formulation: With patient Time For Goal Achievement: 10/29/21 Potential to Achieve Goals: Fair    Frequency Min 3X/week     Co-evaluation               AM-PAC PT "6 Clicks" Mobility  Outcome Measure Help needed turning from your back to your side while in a flat bed without using bedrails?: A Little Help needed moving from lying on your back to sitting on the side of a flat bed without using bedrails?: A Little Help needed moving to and from a bed to a chair (including a wheelchair)?: A Little Help needed standing up from a chair using your arms (e.g., wheelchair or bedside chair)?: A Little Help needed to walk in hospital room?: Total Help needed climbing 3-5 steps with a railing? : Total 6 Click Score: 14    End of Session Equipment Utilized During Treatment: Gait belt Activity Tolerance: Patient limited by fatigue Patient left: in chair;with call bell/phone within reach;with chair alarm set Nurse Communication: Mobility status PT Visit Diagnosis: Unsteadiness on feet (R26.81);Other abnormalities of gait and mobility (R26.89);Muscle weakness (generalized) (M62.81);Difficulty in walking, not elsewhere classified (R26.2)    Time: 1321-1406 PT Time Calculation (min) (ACUTE ONLY): 45 min   Charges:   PT Evaluation $PT Eval Moderate Complexity: 1 Mod PT Treatments $Therapeutic Activity: 23-37 mins        Moishe Spice, PT, DPT Acute Rehabilitation Services  Office: [phone]   Orvan Falconer 10/15/2021, 3:20 PM

## 2021-10-15 NOTE — Plan of Care (Signed)
  Problem: Education: Goal: Ability to demonstrate management of disease process will improve Outcome: Progressing Goal: Ability to verbalize understanding of medication therapies will improve Outcome: Progressing   Problem: Cardiac: Goal: Ability to achieve and maintain adequate cardiopulmonary perfusion will improve Outcome: Progressing   Problem: Coping: Goal: Ability to adjust to condition or change in health will improve Outcome: Progressing   Problem: Fluid Volume: Goal: Ability to maintain a balanced intake and output will improve Outcome: Progressing

## 2021-10-15 NOTE — Plan of Care (Signed)
Discussed with patient plan of care for the evening, pain management and importance of mobility with some teach back displayed.  Problem: Education: Goal: Ability to demonstrate management of disease process will improve Outcome: Progressing

## 2021-10-16 ENCOUNTER — Inpatient Hospital Stay (HOSPITAL_COMMUNITY): Payer: PPO

## 2021-10-16 DIAGNOSIS — I5033 Acute on chronic diastolic (congestive) heart failure: Secondary | ICD-10-CM | POA: Diagnosis not present

## 2021-10-16 LAB — GLUCOSE, CAPILLARY
Glucose-Capillary: 157 mg/dL — ABNORMAL HIGH (ref 70–99)
Glucose-Capillary: 133 mg/dL — ABNORMAL HIGH (ref 70–99)
Glucose-Capillary: 213 mg/dL — ABNORMAL HIGH (ref 70–99)
Glucose-Capillary: 272 mg/dL — ABNORMAL HIGH (ref 70–99)

## 2021-10-16 LAB — CBC
HCT: 28.5 % — ABNORMAL LOW (ref 36.0–46.0)
Hemoglobin: 9 g/dL — ABNORMAL LOW (ref 12.0–15.0)
MCH: 29.7 pg (ref 26.0–34.0)
MCHC: 31.6 g/dL (ref 30.0–36.0)
MCV: 94.1 fL (ref 80.0–100.0)
Platelets: 215 10*3/uL (ref 150–400)
RBC: 3.03 MIL/uL — ABNORMAL LOW (ref 3.87–5.11)
RDW: 14.1 % (ref 11.5–15.5)
WBC: 12.9 10*3/uL — ABNORMAL HIGH (ref 4.0–10.5)
nRBC: 0 % (ref 0.0–0.2)

## 2021-10-16 LAB — BASIC METABOLIC PANEL
Anion gap: 7 (ref 5–15)
BUN: 35 mg/dL — ABNORMAL HIGH (ref 8–23)
CO2: 26 mmol/L (ref 22–32)
Calcium: 8.2 mg/dL — ABNORMAL LOW (ref 8.9–10.3)
Chloride: 100 mmol/L (ref 98–111)
Creatinine, Ser: 2.3 mg/dL — ABNORMAL HIGH (ref 0.44–1.00)
GFR, Estimated: 20 mL/min — ABNORMAL LOW (ref >60)
Glucose, Bld: 200 mg/dL — ABNORMAL HIGH (ref 70–99)
Potassium: 4.6 mmol/L (ref 3.5–5.1)
Sodium: 133 mmol/L — ABNORMAL LOW (ref 135–145)

## 2021-10-16 LAB — MAGNESIUM: Magnesium: 1.7 mg/dL (ref 1.7–2.4)

## 2021-10-16 MED ORDER — ENOXAPARIN SODIUM 30 MG/0.3ML IJ SOSY
30.0000 mg | PREFILLED_SYRINGE | INTRAMUSCULAR | Status: DC
  Administered 2021-10-16 – 2021-10-17 (×2): 30 mg via SUBCUTANEOUS
  Filled 2021-10-16 (×2): qty 0.3

## 2021-10-16 MED ORDER — CEFDINIR 300 MG PO CAPS
300.0000 mg | ORAL_CAPSULE | Freq: Two times a day (BID) | ORAL | Status: DC
  Administered 2021-10-16 (×2): 300 mg via ORAL
  Filled 2021-10-16 (×2): qty 1

## 2021-10-16 MED ORDER — GUAIFENESIN ER 600 MG PO TB12
600.0000 mg | ORAL_TABLET | Freq: Two times a day (BID) | ORAL | Status: DC
  Administered 2021-10-16 – 2021-10-19 (×7): 600 mg via ORAL
  Filled 2021-10-16 (×7): qty 1

## 2021-10-16 NOTE — Plan of Care (Signed)
  Problem: Activity: Goal: Capacity to carry out activities will improve Outcome: Not Progressing   Problem: Education: Goal: Ability to describe self-care measures that may prevent or decrease complications (Diabetes Survival Skills Education) will improve Outcome: Not Progressing   Problem: Cardiac: Goal: Ability to achieve and maintain adequate cardiopulmonary perfusion will improve Outcome: Progressing   Problem: Coping: Goal: Level of anxiety will decrease Outcome: Progressing   Problem: Nutrition: Goal: Adequate nutrition will be maintained Outcome: Progressing

## 2021-10-16 NOTE — Progress Notes (Addendum)
PROGRESS NOTE    [name]  EUM:[id number] DOB: 10-27-1934 DOA: 10/13/2021 PCP: [name], NP  86/F chronically ill morbidly obese female with history of chronic diastolic CHF, CAD,  NSTEMI 02/2020 s/p DES to RCA), COPD, CKD stage IIIa, IDDM, HTN, HLD, anemia of chronic disease, prior PE/DVT, left breast cancer, depression/anxiety, chronic pain, memory deficits who presented to the ED for evaluation of shortness of breath.  She is debilitated and wheelchair-bound at baseline, spends most of her time in bed, able to transfer to bedside commode for BMs. -Presented to the ED 6/8 with worsening dyspnea on exertion, orthopnea and cough.  Along with constipation.  She is on Lasix 20 Mg as needed at baseline, unsure if she was taking this as her daughter dispenses her medications. in the ED labs noted creatinine of 1.1, BNP 116, chest x-ray noted low lung volumes and pulmonary vascular congestion with basilar atelectasis  Subjective: -Feels okay overall, breathing improving, off oxygen  Assessment and Plan:  Acute on chronic diastolic CHF  -At baseline on Lasix 20 Mg PRN only -Recent TTE 07/05/2021 showed EF 55-60%, G1DD. -Diuresed with IV Lasix, creatinine unfortunately bumped to 2.3 today, up from baseline of 1.6, will hold diuretics today -I do not think she is a good SGLT2 candidate with obesity and wheelchair-bound status -Repeat chest x-ray today -Unfortunately urine output is inaccurate and unable to get standing weights  Leukocytosis -She is afebrile with no other overt signs or symptoms of infection, improving, monitor -UA is unremarkable, repeat chest x-ray, suspect a component of bronchitis versus early pneumonia as well  Morbid obesity Debility, wheelchair-bound status -Poor prognostic factors, check PT eval  Coronary artery disease of native artery of native heart with stable angina pectoris (HCC) -Stable, continue  aspirin, Plavix, atorvastatin, Imdur.  COPD with  chronic bronchitis ([place]) -Had audible wheezing yesterday, now improved, continue DuoNebs, albuterol  Type 2 diabetes mellitus with diabetic neuropathy, with long-term current use of insulin (HCC) -High yesterday, better today, continue current dose of Semglee  Essential hypertension Continue home Coreg, Imdur.  AKI on CKD 3a ([place]) -Baseline creatinine around 1.6, 1.7 -Creatinine up to 2.5 today, hold diuretics  Anxiety associated with depression Continue home Cymbalta.  Chronic pain syndrome Continue Percocet q12h as needed.  Dyslipidemia associated with type 2 diabetes mellitus (HCC) Continue atorvastatin.  Constipation -Continue laxatives today  Mild memory deficits -Continue Namenda she is AAOx3  DVT prophylaxis: Lovenox Code Status: DNR Family Communication: Discussed patient in detail, no family at bedside Disposition Plan: Home likely 1 to 2 days  Consultants:    Procedures:   Antimicrobials:    Objective: Vitals:   10/15/21 2333 10/16/21 0427 10/16/21 0733 10/16/21 0757  BP: 128/66 116/74 (!) 136/46   Pulse: 79 85 89   Resp: (!) 22 11 (!) 24   Temp: 98.1 F (36.7 C) 98.6 F (37 C) 98.7 F (37.1 C)   TempSrc: Oral Oral Oral   SpO2: 91% 93% 97% 96%  Weight:  (!) 144.1 kg    Height:        Intake/Output Summary (Last 24 hours) at 10/16/2021 1045 Last data filed at 10/16/2021 0430 Gross per 24 hour  Intake 360 ml  Output 550 ml  Net -190 ml   Filed Weights   10/14/21 0336 10/15/21 0606 10/16/21 0427  Weight: (!) 141.3 kg (!) 143.2 kg (!) 144.1 kg    Examination:  General exam: Obese chronically ill female sitting up in bed, AAOx3, no distress  HEENT: No JVD CVS: S1-S2, regular rate rhythm Lungs: Improving air movement, no expiratory wheezes, scattered rhonchi Abdomen: Soft, obese, nontender bowel sounds present  Extremities: Trace edema Skin: No rashes Psychiatry:  Mood & affect appropriate.     Data Reviewed:   CBC: Recent Labs   Lab 10/13/21 1107 10/14/21 0221 10/15/21 0316 10/16/21 0129  WBC 16.1* 16.2* 14.5* 12.9*  NEUTROABS 12.5*  --   --   --   HGB 10.2* 9.4* 9.4* 9.0*  HCT 32.4* 30.4* 30.8* 28.5*  MCV 96.1 96.2 96.6 94.1  PLT 254 238 224 357   Basic Metabolic Panel: Recent Labs  Lab 10/13/21 1107 10/14/21 0221 10/15/21 0316 10/16/21 0129  NA 141 138 135 133*  K 4.3 4.3 4.6 4.6  CL 108 106 101 100  CO2 '26 25 26 26  '$ GLUCOSE 198* 230* 152* 200*  BUN 18 21 25* 35*  CREATININE 1.13* 1.24* 1.58* 2.30*  CALCIUM 8.4* 8.3* 8.4* 8.2*  MG  --  1.4*  --  1.7   GFR: Estimated Creatinine Clearance: 26.7 mL/min (A) (by C-G formula based on SCr of 2.3 mg/dL (H)). Liver Function Tests: Recent Labs  Lab 10/13/21 1107  AST 10*  ALT 9  ALKPHOS 88  BILITOT 0.4  PROT 7.2  ALBUMIN 2.9*   Recent Labs  Lab 10/13/21 1107  LIPASE 30   No results for input(s): "AMMONIA" in the last 168 hours. Coagulation Profile: No results for input(s): "INR", "PROTIME" in the last 168 hours. Cardiac Enzymes: No results for input(s): "CKTOTAL", "CKMB", "CKMBINDEX", "TROPONINI" in the last 168 hours. BNP (last 3 results) No results for input(s): "PROBNP" in the last 8760 hours. HbA1C: No results for input(s): "HGBA1C" in the last 72 hours. CBG: Recent Labs  Lab 10/15/21 0617 10/15/21 1117 10/15/21 1647 10/15/21 2131 10/16/21 0623  GLUCAP 157* 261* 241* 300* 157*   Lipid Profile: No results for input(s): "CHOL", "HDL", "LDLCALC", "TRIG", "CHOLHDL", "LDLDIRECT" in the last 72 hours. Thyroid Function Tests: No results for input(s): "TSH", "T4TOTAL", "FREET4", "T3FREE", "THYROIDAB" in the last 72 hours. Anemia Panel: No results for input(s): "VITAMINB12", "FOLATE", "FERRITIN", "TIBC", "IRON", "RETICCTPCT" in the last 72 hours. Urine analysis:    Component Value Date/Time   COLORURINE YELLOW 10/13/2021 1908   APPEARANCEUR HAZY (A) 10/13/2021 1908   LABSPEC 1.016 10/13/2021 1908   PHURINE 5.0 10/13/2021  1908   GLUCOSEU NEGATIVE 10/13/2021 1908   HGBUR NEGATIVE 10/13/2021 1908   BILIRUBINUR NEGATIVE 10/13/2021 1908   BILIRUBINUR neg 06/03/2019 1116   KETONESUR NEGATIVE 10/13/2021 1908   PROTEINUR 100 (A) 10/13/2021 1908   UROBILINOGEN 0.2 06/03/2019 1116   UROBILINOGEN 0.2 02/16/2018 1514   NITRITE NEGATIVE 10/13/2021 1908   LEUKOCYTESUR SMALL (A) 10/13/2021 1908   Sepsis Labs: '@LABRCNTIP'$ (procalcitonin:4,lacticidven:4)  ) Recent Results (from the past 240 hour(s))  MRSA Next Gen by PCR, Nasal     Status: None   Collection Time: 10/13/21 10:48 PM   Specimen: Nasal Mucosa; Nasal Swab  Result Value Ref Range Status   MRSA by PCR Next Gen NOT DETECTED NOT DETECTED Final    Comment: (NOTE) The GeneXpert MRSA Assay (FDA approved for NASAL specimens only), is one component of a comprehensive MRSA colonization surveillance program. It is not intended to diagnose MRSA infection nor to guide or monitor treatment for MRSA infections. Test performance is not FDA approved in patients less than 74 years old. Performed at [facility], [place] [address] [place], [place] [address]  Radiology Studies: No results found.   Scheduled Meds:  anastrozole  1 mg Oral Daily   aspirin EC  81 mg Oral Daily   atorvastatin  80 mg Oral QHS   carvedilol  12.5 mg Oral BID   clopidogrel  75 mg Oral Daily   DULoxetine  60 mg Oral Daily   enoxaparin (LOVENOX) injection  40 mg Subcutaneous Q24H   febuxostat  40 mg Oral Daily   guaiFENesin  600 mg Oral BID   insulin aspart  0-20 Units Subcutaneous TID WC   insulin glargine-yfgn  30 Units Subcutaneous Daily   ipratropium-albuterol  3 mL Nebulization BID   isosorbide mononitrate  60 mg Oral Daily   memantine  10 mg Oral Daily   memantine  5 mg Oral QHS   mometasone-formoterol  2 puff Inhalation BID   pantoprazole  20 mg Oral QAC breakfast   polyethylene glycol  17 g Oral Daily   senna-docusate  1 tablet Oral BID   sodium chloride flush  3  mL Intravenous Q12H   Continuous Infusions:     LOS: 2 days    Time spent: 58mn  PDomenic Polite MD [facility]   10/16/2021, 10:45 AM

## 2021-10-17 ENCOUNTER — Inpatient Hospital Stay: Admission: RE | Admit: 2021-10-17 | Payer: Medicare HMO | Source: Ambulatory Visit

## 2021-10-17 DIAGNOSIS — I5033 Acute on chronic diastolic (congestive) heart failure: Secondary | ICD-10-CM | POA: Diagnosis not present

## 2021-10-17 LAB — BASIC METABOLIC PANEL
Anion gap: 7 (ref 5–15)
BUN: 40 mg/dL — ABNORMAL HIGH (ref 8–23)
CO2: 24 mmol/L (ref 22–32)
Calcium: 8.1 mg/dL — ABNORMAL LOW (ref 8.9–10.3)
Chloride: 101 mmol/L (ref 98–111)
Creatinine, Ser: 2.02 mg/dL — ABNORMAL HIGH (ref 0.44–1.00)
GFR, Estimated: 24 mL/min — ABNORMAL LOW (ref >60)
Glucose, Bld: 191 mg/dL — ABNORMAL HIGH (ref 70–99)
Potassium: 5.1 mmol/L (ref 3.5–5.1)
Sodium: 132 mmol/L — ABNORMAL LOW (ref 135–145)

## 2021-10-17 LAB — CBC
HCT: 27.4 % — ABNORMAL LOW (ref 36.0–46.0)
Hemoglobin: 8.5 g/dL — ABNORMAL LOW (ref 12.0–15.0)
MCH: 29.6 pg (ref 26.0–34.0)
MCHC: 31 g/dL (ref 30.0–36.0)
MCV: 95.5 fL (ref 80.0–100.0)
Platelets: 220 10*3/uL (ref 150–400)
RBC: 2.87 MIL/uL — ABNORMAL LOW (ref 3.87–5.11)
RDW: 14 % (ref 11.5–15.5)
WBC: 14.2 10*3/uL — ABNORMAL HIGH (ref 4.0–10.5)
nRBC: 0 % (ref 0.0–0.2)

## 2021-10-17 LAB — GLUCOSE, CAPILLARY
Glucose-Capillary: 180 mg/dL — ABNORMAL HIGH (ref 70–99)
Glucose-Capillary: 211 mg/dL — ABNORMAL HIGH (ref 70–99)

## 2021-10-17 LAB — URINE CULTURE: Culture: >=100000 — AB

## 2021-10-17 MED ORDER — GLUCERNA SHAKE PO LIQD
237.0000 mL | Freq: Three times a day (TID) | ORAL | Status: DC
  Administered 2021-10-17 – 2021-10-22 (×12): 237 mL via ORAL

## 2021-10-17 MED ORDER — SODIUM CHLORIDE 0.9 % IV SOLN
1.0000 g | INTRAVENOUS | Status: DC
  Administered 2021-10-17 – 2021-10-21 (×5): 1 g via INTRAVENOUS
  Filled 2021-10-17 (×5): qty 10

## 2021-10-17 MED ORDER — RENA-VITE PO TABS
1.0000 | ORAL_TABLET | Freq: Every day | ORAL | Status: DC
  Administered 2021-10-17 – 2021-10-21 (×5): 1 via ORAL
  Filled 2021-10-17 (×5): qty 1

## 2021-10-17 NOTE — Progress Notes (Signed)
PROGRESS NOTE    [name]  CWC:[id number] DOB: 04/22/1935 DOA: 10/13/2021 PCP: [name], NP  86/F chronically ill morbidly obese female with history of chronic diastolic CHF, CAD,  NSTEMI 02/2020 s/p DES to RCA), COPD, CKD stage IIIa, IDDM, HTN, HLD, anemia of chronic disease, prior PE/DVT, left breast cancer, depression/anxiety, chronic pain, memory deficits who presented to the ED for evaluation of shortness of breath.  She is debilitated and wheelchair-bound at baseline, spends most of her time in bed, able to transfer to bedside commode for BMs. -Presented to the ED 6/8 with worsening dyspnea on exertion, orthopnea and cough. She is on Lasix 20 Mg PRN at baseline, unsure if she was taking this as her daughter dispenses her medications. in the ED labs noted creatinine of 1.1, BNP 116, WBC of 16 K, chest x-ray noted low lung volumes and pulmonary vascular congestion with basilar atelectasis. -Improving on diuretics and antibiotics for bronchitis/early PNA and UTI  Subjective: -Recalls having some dysuria yesterday, breathing improving, reports productive cough  Assessment and Plan:  Acute on chronic diastolic CHF  -At baseline on Lasix 20 Mg PRN only -Recent TTE 07/05/2021 showed EF 55-60%, G1DD. -Diuresed with IV Lasix, unfortunately urine output has been inaccurate and standing weights unreliable as well, creatinine bumped to 2.3 yesterday up from baseline of 1.6, now improving  -Hold diuretics 1 more day, hopefully can resume tomorrow  -I do not think she is a good SGLT2 candidate with morbid obesity and bedbound status -Repeat chest x-ray with worsening atelectasis  Bronchitis, early pneumonia Leukocytosis -Afebrile, has a productive cough, white count up to 16 K, chest x-ray with worsening atelectasis, starting IV ceftriaxone, DuoNebs for pulmonary toilet -Urine culture growing E. coli as well, had some dysuria yesterday, follow-up sensitivities, ceftriaxone will cover  both  E. coli UTI -See discussion above  Morbid obesity Debility, wheelchair-bound status -Poor prognostic factors, check PT eval completed, home health PT recommended  Coronary artery disease of native artery of native heart with stable angina pectoris (HCC) -Stable, continue  aspirin, Plavix, atorvastatin, Imdur.  COPD with chronic bronchitis ([place]) -No further expiratory wheezes, see discussion above, starting ABX, continue DuoNebs, albuterol  Type 2 diabetes mellitus with diabetic neuropathy, with long-term current use of insulin (HCC) -CBGs are stable, continue current dose of 720  Essential hypertension Continue home Coreg, Imdur.  AKI on CKD 3a ([place]) -Baseline creatinine around 1.6, 1.7 -Creatinine 2.3 yesterday, diuretics on hold now, improving down to 2.0 today  Anxiety associated with depression Continue home Cymbalta.  Chronic pain syndrome Continue Percocet q12h as needed.  Dyslipidemia associated with type 2 diabetes mellitus (HCC) Continue atorvastatin.  Constipation -Continue laxatives today, having BMs  Mild memory deficits -Continue Namenda she is AAOx3  DVT prophylaxis: Lovenox Code Status: DNR Family Communication: Discussed patient in detail, no family at bedside Disposition Plan: Home likely 2 to 3 days  Consultants:    Procedures:   Antimicrobials:    Objective: Vitals:   10/16/21 2051 10/16/21 2212 10/17/21 0501 10/17/21 0907  BP:  (!) 151/73 (!) 148/53   Pulse:  80 84 83  Resp:  '11 19 18  '$ Temp:  98 F (36.7 C) 98.1 F (36.7 C)   TempSrc:  Oral Oral   SpO2: 95% 94% 95% 96%  Weight:   (!) 145.9 kg   Height:        Intake/Output Summary (Last 24 hours) at 10/17/2021 1126 Last data filed at 10/17/2021 0836 Gross per 24 hour  Intake 100 ml  Output 1250 ml  Net -1150 ml   Filed Weights   10/15/21 0606 10/16/21 0427 10/17/21 0501  Weight: (!) 143.2 kg (!) 144.1 kg (!) 145.9 kg    Examination:  General exam: Obese  chronically ill female sitting up in bed, AAOx3, no distress HEENT: Positive JVD CVS: S1-S2, regular rate rhythm Lungs: Few scattered basilar rhonchi, otherwise clear Abdomen: Soft, obese, nontender, bowel sounds present Extremities: Trace edema  Skin: No rashes Psychiatry:  Mood & affect appropriate.     Data Reviewed:   CBC: Recent Labs  Lab 10/13/21 1107 10/14/21 0221 10/15/21 0316 10/16/21 0129 10/17/21 0115  WBC 16.1* 16.2* 14.5* 12.9* 14.2*  NEUTROABS 12.5*  --   --   --   --   HGB 10.2* 9.4* 9.4* 9.0* 8.5*  HCT 32.4* 30.4* 30.8* 28.5* 27.4*  MCV 96.1 96.2 96.6 94.1 95.5  PLT 254 238 224 215 132   Basic Metabolic Panel: Recent Labs  Lab 10/13/21 1107 10/14/21 0221 10/15/21 0316 10/16/21 0129 10/17/21 0115  NA 141 138 135 133* 132*  K 4.3 4.3 4.6 4.6 5.1  CL 108 106 101 100 101  CO2 '26 25 26 26 24  '$ GLUCOSE 198* 230* 152* 200* 191*  BUN 18 21 25* 35* 40*  CREATININE 1.13* 1.24* 1.58* 2.30* 2.02*  CALCIUM 8.4* 8.3* 8.4* 8.2* 8.1*  MG  --  1.4*  --  1.7  --    GFR: Estimated Creatinine Clearance: 30.6 mL/min (A) (by C-G formula based on SCr of 2.02 mg/dL (H)). Liver Function Tests: Recent Labs  Lab 10/13/21 1107  AST 10*  ALT 9  ALKPHOS 88  BILITOT 0.4  PROT 7.2  ALBUMIN 2.9*   Recent Labs  Lab 10/13/21 1107  LIPASE 30   No results for input(s): "AMMONIA" in the last 168 hours. Coagulation Profile: No results for input(s): "INR", "PROTIME" in the last 168 hours. Cardiac Enzymes: No results for input(s): "CKTOTAL", "CKMB", "CKMBINDEX", "TROPONINI" in the last 168 hours. BNP (last 3 results) No results for input(s): "PROBNP" in the last 8760 hours. HbA1C: No results for input(s): "HGBA1C" in the last 72 hours. CBG: Recent Labs  Lab 10/16/21 0623 10/16/21 1109 10/16/21 1601 10/16/21 2051 10/17/21 0652  GLUCAP 157* 133* 213* 272* 180*   Lipid Profile: No results for input(s): "CHOL", "HDL", "LDLCALC", "TRIG", "CHOLHDL", "LDLDIRECT" in  the last 72 hours. Thyroid Function Tests: No results for input(s): "TSH", "T4TOTAL", "FREET4", "T3FREE", "THYROIDAB" in the last 72 hours. Anemia Panel: No results for input(s): "VITAMINB12", "FOLATE", "FERRITIN", "TIBC", "IRON", "RETICCTPCT" in the last 72 hours. Urine analysis:    Component Value Date/Time   COLORURINE YELLOW 10/13/2021 1908   APPEARANCEUR HAZY (A) 10/13/2021 1908   LABSPEC 1.016 10/13/2021 1908   PHURINE 5.0 10/13/2021 1908   GLUCOSEU NEGATIVE 10/13/2021 1908   HGBUR NEGATIVE 10/13/2021 1908   BILIRUBINUR NEGATIVE 10/13/2021 1908   BILIRUBINUR neg 06/03/2019 1116   KETONESUR NEGATIVE 10/13/2021 1908   PROTEINUR 100 (A) 10/13/2021 1908   UROBILINOGEN 0.2 06/03/2019 1116   UROBILINOGEN 0.2 02/16/2018 1514   NITRITE NEGATIVE 10/13/2021 1908   LEUKOCYTESUR SMALL (A) 10/13/2021 1908   Sepsis Labs: '@LABRCNTIP'$ (procalcitonin:4,lacticidven:4)  ) Recent Results (from the past 240 hour(s))  MRSA Next Gen by PCR, Nasal     Status: None   Collection Time: 10/13/21 10:48 PM   Specimen: Nasal Mucosa; Nasal Swab  Result Value Ref Range Status   MRSA by PCR Next Gen NOT DETECTED NOT DETECTED  Final    Comment: (NOTE) The GeneXpert MRSA Assay (FDA approved for NASAL specimens only), is one component of a comprehensive MRSA colonization surveillance program. It is not intended to diagnose MRSA infection nor to guide or monitor treatment for MRSA infections. Test performance is not FDA approved in patients less than 36 years old. Performed at [facility], [place] [address] [place], [place] [address]   Urine Culture     Status: Abnormal (Preliminary result)   Collection Time: 10/15/21  7:57 AM   Specimen: Urine, Clean Catch  Result Value Ref Range Status   Specimen Description URINE, CLEAN CATCH  Final   Special Requests   Final    NONE Performed at [facility], [place] [address], [place], [place] [address]    Culture >=100,000 COLONIES/mL ESCHERICHIA COLI  (A)  Final   Report Status PENDING  Incomplete     Radiology Studies: DG Chest 2 View  Result Date: 10/16/2021 CLINICAL DATA:  Productive cough for 2 weeks with shortness of breath and abdominal pain. EXAM: CHEST - 2 VIEW COMPARISON:  Chest radiographs 10/13/2021 FINDINGS: The patient is slightly rotated to the right. The cardiac silhouette is accentuated by low lung volumes and AP technique. Aortic atherosclerosis is noted. Mild central pulmonary vascular congestion is unchanged. Increased bibasilar opacities likely reflect atelectasis. Trace pleural effusions are not excluded. No overt pulmonary edema or pneumothorax is identified. No acute osseous abnormality is seen. IMPRESSION: Low lung volumes with mild pulmonary vascular congestion and increased bibasilar atelectasis. Electronically Signed   By: Logan Bores M.D.   On: 10/16/2021 11:07     Scheduled Meds:  anastrozole  1 mg Oral Daily   aspirin EC  81 mg Oral Daily   atorvastatin  80 mg Oral QHS   carvedilol  12.5 mg Oral BID   clopidogrel  75 mg Oral Daily   DULoxetine  60 mg Oral Daily   enoxaparin (LOVENOX) injection  30 mg Subcutaneous Q24H   febuxostat  40 mg Oral Daily   guaiFENesin  600 mg Oral BID   insulin aspart  0-20 Units Subcutaneous TID WC   insulin glargine-yfgn  30 Units Subcutaneous Daily   ipratropium-albuterol  3 mL Nebulization BID   isosorbide mononitrate  60 mg Oral Daily   memantine  10 mg Oral Daily   memantine  5 mg Oral QHS   mometasone-formoterol  2 puff Inhalation BID   pantoprazole  20 mg Oral QAC breakfast   polyethylene glycol  17 g Oral Daily   senna-docusate  1 tablet Oral BID   sodium chloride flush  3 mL Intravenous Q12H   Continuous Infusions:  cefTRIAXone (ROCEPHIN)  IV 1 g (10/17/21 0850)      LOS: 3 days    Time spent: 33mn  PDomenic Polite MD [facility]   10/17/2021, 11:26 AM

## 2021-10-17 NOTE — Evaluation (Signed)
Occupational Therapy Evaluation and Discharge Patient Details Name: [name] MRN: [id number] DOB: 1935-01-03 Today's Date: 10/17/2021   History of Present Illness Pt is an 87 y.o. female who presented 10/13/21 for SOB and hypoxia secondary to acute on chronic heart failure with preserved ejection fraction. PMH: chronic HFpEF (EF 55-60% by TTE 07/05/2021), CAD (BMS to RCA 2004, DESx2 to mid and proximal RCA 2012, recent NSTEMI 02/2020 s/p DES to RCA), COPD, CKD stage IIIa, IDDM, HTN, HLD, anemia of chronic disease, prior PE/DVT, left breast cancer, depression/anxiety, chronic pain, dementia   Clinical Impression   Pt self fed and participated in grooming, otherwise dependent in ADLs and all IADLs prior to admission. Pt transfers with assist to manual w/c and family pushes w/c for her. Pt presents with generalized weakness, impaired standing balance and impaired activity tolerance. She is likely very near her functional baseline. All DME needs met at home and she has excellent family support. No further OT needs.      Recommendations for follow up therapy are one component of a multi-disciplinary discharge planning process, led by the attending physician.  Recommendations may be updated based on patient status, additional functional criteria and insurance authorization.   Follow Up Recommendations  No OT follow up    Assistance Recommended at Discharge Frequent or constant Supervision/Assistance  Patient can return home with the following A lot of help with bathing/dressing/bathroom    Functional Status Assessment  Patient has not had a recent decline in their functional status  Equipment Recommendations  None recommended by OT    Recommendations for Other Services       Precautions / Restrictions Precautions Precautions: Fall Restrictions Weight Bearing Restrictions: No      Mobility Bed Mobility               General bed mobility comments: in chair     Transfers Overall transfer level: Needs assistance   Transfers: Sit to/from Stand Sit to Stand: Min assist           General transfer comment: stood from recliner for pericare      Balance Overall balance assessment: Needs assistance   Sitting balance-Leahy Scale: Fair     Standing balance support: Bilateral upper extremity supported, Single extremity supported, During functional activity Standing balance-Leahy Scale: Poor Standing balance comment: decreased standing tolerance, approx 30 seconds                           ADL either performed or assessed with clinical judgement   ADL Overall ADL's : At baseline                                             Vision Ability to See in Adequate Light: 0 Adequate       Perception     Praxis      Pertinent Vitals/Pain Pain Assessment Pain Assessment: Faces Faces Pain Scale: Hurts little more Pain Location: knees Pain Descriptors / Indicators: Discomfort Pain Intervention(s): Monitored during session, Repositioned     Hand Dominance Right   Extremity/Trunk Assessment Upper Extremity Assessment Upper Extremity Assessment: Generalized weakness   Lower Extremity Assessment Lower Extremity Assessment: Defer to PT evaluation   Cervical / Trunk Assessment Cervical / Trunk Assessment: Other exceptions Cervical / Trunk Exceptions: increased body habitus   Communication Communication Communication: No  difficulties   Cognition Arousal/Alertness: Awake/alert Behavior During Therapy: WFL for tasks assessed/performed Overall Cognitive Status: Within Functional Limits for tasks assessed                                       General Comments       Exercises     Shoulder Instructions      Home Living Family/patient expects to be discharged to:: Private residence Living Arrangements: Children Available Help at Discharge: Family;Available 24 hours/day Type of Home:  House Home Access: Ramped entrance     Home Layout: One level     Bathroom Shower/Tub: Occupational psychologist: Handicapped height     Home Equipment: Conservation officer, nature (2 wheels);Wheelchair - manual;BSC/3in1;Shower seat;Grab bars - tub/shower;Hand held shower head;Other (comment) (mechanical bed)          Prior Functioning/Environment Prior Level of Function : Needs assist             Mobility Comments: Uses WC mainly, but does have RW for short distance ambulation, but reports "I cannot tell you when" she last walked a bit. Reports she cannot push her own w/c and thus someone else has to push her around the house. Needs assistance for rolling and all bed mobility and stand step/pivot transfers. ADLs Comments: self feeds, is otherwise dependent in ADLs and IADLs        OT Problem List:        OT Treatment/Interventions:      OT Goals(Current goals can be found in the care plan section)    OT Frequency:      Co-evaluation              AM-PAC OT "6 Clicks" Daily Activity     Outcome Measure Help from another person eating meals?: None Help from another person taking care of personal grooming?: A Little Help from another person toileting, which includes using toliet, bedpan, or urinal?: Total Help from another person bathing (including washing, rinsing, drying)?: Total Help from another person to put on and taking off regular upper body clothing?: Total Help from another person to put on and taking off regular lower body clothing?: Total 6 Click Score: 11   End of Session Nurse Communication: Mobility status  Activity Tolerance: Patient tolerated treatment well Patient left: in chair;with call bell/phone within reach;with family/visitor present  OT Visit Diagnosis: Unsteadiness on feet (R26.81)                Time: 1130-1144 OT Time Calculation (min): 14 min Charges:  OT General Charges $OT Visit: 1 Visit OT Evaluation $OT Eval Moderate  Complexity: 1 Mod  [name], OTR/L Acute Rehabilitation Services Office: [phone]   [name] 10/17/2021, 11:44 AM

## 2021-10-17 NOTE — Progress Notes (Signed)
Initial Nutrition Assessment  DOCUMENTATION CODES:   Morbid obesity  INTERVENTION:   - Glucerna Shake po TID, each supplement provides 220 kcal and 10 grams of protein  - Renal MVI daily  - Encourage PO intake  NUTRITION DIAGNOSIS:   Increased nutrient needs related to chronic illness (COPD, CHF) as evidenced by estimated needs.  GOAL:   Patient will meet greater than or equal to 90% of their needs  MONITOR:   PO intake, Supplement acceptance, Labs, Weight trends, I & O's  REASON FOR ASSESSMENT:   Consult Poor PO  ASSESSMENT:   87 year old female who presented to the ED on 6/08 with abdominal pain and SOB. PMH of T2DM, COPD, CAD, CHF, anemia, breast cancer, CKD stage IIIa, HTN, HLD, depression, anxiety. Pt admitted with acute on chronic HF.  Spoke with pt and family member at bedside. Pt reports that her appetite is okay and that she eats if she likes the food. Pt reports that she does not like most of the food here at the hospital. However, noted meal completions documented as 80-100%.  Pt and family member report pt with poor PO intake and decreased appetite for at least 1 month PTA. Pt reports that she just eats a few bites at each mealtime. When asked what type of foods pt typically eats, pt reports she eats "a regular diet." Pt states that she used to snack in between meals but does not do so anymore.  Pt endorses weight loss over time.  She reports that she used to weigh 321 lbs and weighed 310 lbs on admission. Current weight is up to 321 lbs but pt with edema to BLE at this time.  Reviewed weight history in chart. Pt's weight has fluctuated between 141-155 kg over the last year with no real trends noted. Suspect weight fluctuations are related to volume status given CHF diagnosis. Difficult to determine pt's dry weight at this time.  Pt denies any N/V at this time. She also denies any issues chewing or swallowing.  Pt states that she was provided a Glucerna Shake  yesterday which she enjoyed. She would like to receive these during admission to optimize PO intake since appetite is poor. RD to order. Will also order daily renal MVI given pt with CKD stage IIIa.  Admit weight: 141.3 kg Current weight: 145.9 kg  Meal Completion: 80-100%  Medications reviewed and include: SSI, semglee 30 units daily, protonix, miralax, senna, IV abx  Labs reviewed: sodium 132, BUN 40, creatinine 2.02, WBC 14.2, hemoglobin 8.5 CBG's: 133-272 x 24 hours  I/O's: -1.7 L since admit  NUTRITION - FOCUSED PHYSICAL EXAM:  Flowsheet Row Most Recent Value  Orbital Region No depletion  Upper Arm Region No depletion  Thoracic and Lumbar Region No depletion  Buccal Region No depletion  Temple Region Mild depletion  Clavicle Bone Region Mild depletion  Clavicle and Acromion Bone Region Mild depletion  Scapular Bone Region No depletion  Dorsal Hand No depletion  Patellar Region No depletion  Anterior Thigh Region No depletion  Posterior Calf Region No depletion  Edema (RD Assessment) Moderate  [BLE]  Hair Reviewed  Eyes Reviewed  Mouth Reviewed  Skin Reviewed  Nails Reviewed       Diet Order:   Diet Order             Diet heart healthy/carb modified Room service appropriate? Yes; Fluid consistency: Thin  Diet effective now  EDUCATION NEEDS:   Education needs have been addressed  Skin:  Skin Assessment: Reviewed RN Assessment  Last BM:  10/14/21  Height:   Ht Readings from Last 1 Encounters:  10/13/21 '5\' 7"'$  (1.702 m)    Weight:   Wt Readings from Last 1 Encounters:  10/17/21 (!) 145.9 kg    BMI:  Body mass index is 50.38 kg/m.  Estimated Nutritional Needs:   Kcal:  1800-2000  Protein:  100-115 grams  Fluid:  1.8 L    Gustavus Bryant, MS, RD, LDN Inpatient Clinical Dietitian Please see AMiON for contact information.

## 2021-10-17 NOTE — Plan of Care (Signed)
  Problem: Education: Goal: Ability to demonstrate management of disease process will improve Outcome: Progressing Goal: Ability to verbalize understanding of medication therapies will improve Outcome: Progressing   Problem: Coping: Goal: Ability to adjust to condition or change in health will improve Outcome: Progressing   Problem: Fluid Volume: Goal: Ability to maintain a balanced intake and output will improve Outcome: Progressing   Problem: Cardiac: Goal: Ability to achieve and maintain adequate cardiopulmonary perfusion will improve Outcome: Progressing

## 2021-10-17 NOTE — Progress Notes (Signed)
Heart Failure Stewardship Pharmacist Progress Note   PCP: [name], NP PCP-Cardiologist: Ena Dawley, MD    HPI:  [name] with PMH of T2DM, COPD, CAD s/p DES to prox and mid RCA, HF, PE off anticoagulation with worsening anemia, breast cancer, and morbid obesity. She presented to the ED on 6/8 with abdominal pain, headache, shortness of breath, and orthopnea. CXR with vascular congestion and basilar atelectasis. Her last ECHO was done 2/28 and LVEF was 55-60% with G1DD.  Current HF Medications: Beta Blocker: carvedilol 12.5 mg BID Other: Imdur 60 mg daily  Prior to admission HF Medications: Diuretic: furosemide 20 mg daily PRN Beta blocker: carvedilol 12.5 mg BID Other: Imdur 60 mg daily  Pertinent Lab Values: Serum creatinine 2.02, BUN 40, Potassium 5.1, Sodium 132, BNP 116, Magnesium 1.7, A1c 7( 07/04/21)   Vital Signs: Weight: 321 lbs (admission weight: 311 lbs) Blood pressure: 150/70s  Heart rate: 80s  I/O: -1.1L yesterday, net -1.7L   Medication Assistance / Insurance Benefits Check: Does the patient have prescription insurance?  Yes Type of insurance plan: [employer]  Does the patient qualify for medication assistance through manufacturers or grants?   Pending Eligible grants and/or patient assistance programs: pending Medication assistance applications in progress: none  Medication assistance applications approved: none Approved medication assistance renewals will be completed by: pending  Outpatient Pharmacy:  Prior to admission outpatient pharmacy: [facility] Is the patient willing to use [place] pharmacy at discharge? Yes Is the patient willing to transition their outpatient pharmacy to utilize a [facility] outpatient pharmacy?   Pending    Assessment: 1. Acute on chronic diastolic CHF (LVEF 34-74%). NYHA class III symptoms. - Off diuretics with AKI. Weight up 4 lbs today although have been unreliable. Consider resuming tomorrow if SCr  continues to improve. Keep K>4 and mag>2 - Continue carvedilol 12.5 mg BID - No SGLT2i with current treatment for ecoli UTI, also wheelchair bound and AKI - Continue Imdur 60 mg daily   Plan: 1) Medication changes recommended at this time: - Resume diuretics tomorrow if renal function improves  2) Patient assistance: - Appears she is in the donut hole - Copays currently are >$100 for Jardiance and Farxiga  - Will look into patient assistance options for her if these are added  3)  Education  - To be completed prior to discharge  [name], PharmD, BCPS Heart Failure Cytogeneticist Phone [phone]

## 2021-10-18 ENCOUNTER — Ambulatory Visit: Payer: HMO | Admitting: Psychologist

## 2021-10-18 DIAGNOSIS — I5033 Acute on chronic diastolic (congestive) heart failure: Secondary | ICD-10-CM | POA: Diagnosis not present

## 2021-10-18 LAB — GLUCOSE, CAPILLARY
Glucose-Capillary: 212 mg/dL — ABNORMAL HIGH (ref 70–99)
Glucose-Capillary: 299 mg/dL — ABNORMAL HIGH (ref 70–99)
Glucose-Capillary: 163 mg/dL — ABNORMAL HIGH (ref 70–99)
Glucose-Capillary: 185 mg/dL — ABNORMAL HIGH (ref 70–99)
Glucose-Capillary: 319 mg/dL — ABNORMAL HIGH (ref 70–99)
Glucose-Capillary: 253 mg/dL — ABNORMAL HIGH (ref 70–99)

## 2021-10-18 LAB — BASIC METABOLIC PANEL
Anion gap: 8 (ref 5–15)
BUN: 44 mg/dL — ABNORMAL HIGH (ref 8–23)
CO2: 26 mmol/L (ref 22–32)
Calcium: 8.4 mg/dL — ABNORMAL LOW (ref 8.9–10.3)
Chloride: 101 mmol/L (ref 98–111)
Creatinine, Ser: 1.73 mg/dL — ABNORMAL HIGH (ref 0.44–1.00)
GFR, Estimated: 29 mL/min — ABNORMAL LOW (ref >60)
Glucose, Bld: 181 mg/dL — ABNORMAL HIGH (ref 70–99)
Potassium: 5.1 mmol/L (ref 3.5–5.1)
Sodium: 135 mmol/L (ref 135–145)

## 2021-10-18 LAB — CBC
HCT: 28.3 % — ABNORMAL LOW (ref 36.0–46.0)
Hemoglobin: 8.9 g/dL — ABNORMAL LOW (ref 12.0–15.0)
MCH: 29.5 pg (ref 26.0–34.0)
MCHC: 31.4 g/dL (ref 30.0–36.0)
MCV: 93.7 fL (ref 80.0–100.0)
Platelets: 211 10*3/uL (ref 150–400)
RBC: 3.02 MIL/uL — ABNORMAL LOW (ref 3.87–5.11)
RDW: 14 % (ref 11.5–15.5)
WBC: 13.8 10*3/uL — ABNORMAL HIGH (ref 4.0–10.5)
nRBC: 0 % (ref 0.0–0.2)

## 2021-10-18 MED ORDER — FUROSEMIDE 40 MG PO TABS
40.0000 mg | ORAL_TABLET | Freq: Every day | ORAL | Status: DC
  Administered 2021-10-18 – 2021-10-22 (×5): 40 mg via ORAL
  Filled 2021-10-18 (×5): qty 1

## 2021-10-18 MED ORDER — POLYETHYLENE GLYCOL 3350 17 G PO PACK
17.0000 g | PACK | Freq: Two times a day (BID) | ORAL | Status: DC
Start: 2021-10-18 — End: 2021-10-22
  Administered 2021-10-18 – 2021-10-20 (×4): 17 g via ORAL
  Filled 2021-10-18 (×8): qty 1

## 2021-10-18 MED ORDER — ENOXAPARIN SODIUM 80 MG/0.8ML IJ SOSY
70.0000 mg | PREFILLED_SYRINGE | INTRAMUSCULAR | Status: DC
  Administered 2021-10-18 – 2021-10-21 (×4): 70 mg via SUBCUTANEOUS
  Filled 2021-10-18 (×4): qty 0.8

## 2021-10-18 MED ORDER — INSULIN ASPART 100 UNIT/ML IJ SOLN
2.0000 [IU] | Freq: Three times a day (TID) | INTRAMUSCULAR | Status: DC
Start: 2021-10-18 — End: 2021-10-22
  Administered 2021-10-18 – 2021-10-22 (×12): 2 [IU] via SUBCUTANEOUS

## 2021-10-18 NOTE — Progress Notes (Signed)
Short of breath on exertion while getting back to bed. Placed back on o2 2l [place] sat- 93 % continue to monitor.

## 2021-10-18 NOTE — Progress Notes (Signed)
Mobility Specialist Progress Note    10/18/21 1028  Mobility  Activity Transferred to/from [facility];Transferred from bed to chair  Level of Assistance Moderate assist, patient does Administrator, sports wheel walker;Other (Comment) (HHA)  Activity Response Tolerated fair  $Mobility charge 1 Mobility   Pt received in bed and agreeable. Pivoted to [employer] for void. Pivoted to chair. Left with call bell in reach and family present.   Hildred Alamin Mobility Specialist

## 2021-10-18 NOTE — Inpatient Diabetes Management (Signed)
Inpatient Diabetes Program Recommendations  AACE/ADA: New Consensus Statement on Inpatient Glycemic Control (2015)  Target Ranges:  Prepandial:   less than 140 mg/dL      Peak postprandial:   less than 180 mg/dL (1-2 hours)      Critically ill patients:  140 - 180 mg/dL   Lab Results  Component Value Date   GLUCAP 163 (H) 10/18/2021   HGBA1C 7.0 (H) 07/04/2021    Review of Glycemic Control  Latest Reference Range & Units 10/17/21 06:52 10/17/21 11:24 10/17/21 15:20 10/17/21 21:53 10/18/21 06:14  Glucose-Capillary 70 - 99 mg/dL 180 (H) 212 (H) 299 (H) 211 (H) 163 (H)  (H): Data is abnormally high  Diabetes history: DM2 Outpatient Diabetes medications: Toujeo 42 units QAM, Novolog 16 units TID  Current orders for Inpatient glycemic control: Semglee 30 units, Novolog 0-20 units tid  Inpatient Diabetes Program Recommendations:   Patient received Novolog 22 units correction yesterday. Patient has postprandial hyperglycemia. Please consider: -Decrease Novolog correction to 0-9 units tid -Add Novolog 3 units tid meal coverage if eats 50% Secure chat sent to Dr. Broadus John.  Thank you, [name]. Nichole Bergquist, RN, MSN, CDE  Diabetes Coordinator Inpatient Glycemic Control Team Team Pager [phone] (8am-5pm) 10/18/2021 9:01 AM

## 2021-10-18 NOTE — Progress Notes (Signed)
Physical Therapy Treatment Patient Details Name: ANNALEIGH [name] MRN: [id number] DOB: [birth year] Today's Date: 10/18/2021   History of Present Illness Pt is an 87 y.o. female who presented 10/13/21 for SOB and hypoxia secondary to acute on chronic heart failure with preserved ejection fraction. PMH: chronic HFpEF (EF 55-60% by TTE 07/05/2021), CAD (BMS to RCA 2004, DESx2 to mid and proximal RCA 2012, recent NSTEMI 02/2020 s/p DES to RCA), COPD, CKD stage IIIa, IDDM, HTN, HLD, anemia of chronic disease, prior PE/DVT, left breast cancer, depression/anxiety, chronic pain, dementia    PT Comments    Pt demonstrated increased fear of falling today, resulting in her requiring modAx2 to stand from recliner 3x. Daughter present and reporting her and her other family members can assist the pt some, but there are limitations physically and with time convenience. However, they are adamantly declining for pt to go to a SNF for rehab. Thus, if they decide to take pt home with their assistance, recommend [facility] aides and HHPT. Will continue to follow acutely.     Recommendations for follow up therapy are one component of a multi-disciplinary discharge planning process, led by the attending physician.  Recommendations may be updated based on patient status, additional functional criteria and insurance authorization.  Follow Up Recommendations  Home health PT (if family can provide level of care needed)     Assistance Recommended at Discharge Intermittent Supervision/Assistance  Patient can return home with the following A little help with bathing/dressing/bathroom;Assistance with cooking/housework;Assist for transportation;Help with stairs or ramp for entrance;A lot of help with walking and/or transfers   Equipment Recommendations  Hospital bed;Other (comment) (electric w/c)    Recommendations for Other Services       Precautions / Restrictions Precautions Precautions: Fall;Other (comment) Precaution  Comments: watch SpO2 Restrictions Weight Bearing Restrictions: No     Mobility  Bed Mobility               General bed mobility comments: Pt up in chair upon arrival.    Transfers Overall transfer level: Needs assistance Equipment used: Rolling walker (2 wheels) Transfers: Sit to/from Stand Sit to Stand: Mod assist, +2 physical assistance, +2 safety/equipment           General transfer comment: Pt cued to push up from recliner arm rests, needing up to modAx2 to power up to stand, gain stability, and extend hips. cues provided to push RW down into ground rather than lean back and pull RW up. x3 reps    Ambulation/Gait               General Gait Details: pt declined due to fear of falling and inability to stand but briefly   Stairs             Wheelchair Mobility    Modified Rankin (Stroke Patients Only)       Balance Overall balance assessment: Needs assistance       Postural control: Posterior lean Standing balance support: Bilateral upper extremity supported, During functional activity Standing balance-Leahy Scale: Poor Standing balance comment: Reliant on UE support and up to min-modAx2, standing briefly x3 reps. Cues provided to reduce posterior bias                            Cognition Arousal/Alertness: Awake/alert Behavior During Therapy: Anxious Overall Cognitive Status: Within Functional Limits for tasks assessed  General Comments: fearful of falling        Exercises General Exercises - Lower Extremity Long Arc Quad: AROM, Strengthening, Both, 10 reps, Seated    General Comments General comments (skin integrity, edema, etc.): VSS on RA      Pertinent Vitals/Pain Pain Assessment Pain Assessment: Faces Faces Pain Scale: Hurts little more Pain Location: knees Pain Descriptors / Indicators: Discomfort, Grimacing Pain Intervention(s): Monitored during session, Limited  activity within patient's tolerance, Repositioned    Home Living                          Prior Function            PT Goals (current goals can now be found in the care plan section) Acute Rehab PT Goals Patient Stated Goal: to get stronger PT Goal Formulation: With patient/family Time For Goal Achievement: 10/29/21 Potential to Achieve Goals: Fair Progress towards PT goals: Not progressing toward goals - comment (functional decline with increased fear of falling today)    Frequency    Min 3X/week      PT Plan Current plan remains appropriate    Co-evaluation              AM-PAC PT "6 Clicks" Mobility   Outcome Measure  Help needed turning from your back to your side while in a flat bed without using bedrails?: A Little Help needed moving from lying on your back to sitting on the side of a flat bed without using bedrails?: A Little Help needed moving to and from a bed to a chair (including a wheelchair)?: Total Help needed standing up from a chair using your arms (e.g., wheelchair or bedside chair)?: Total Help needed to walk in hospital room?: Total Help needed climbing 3-5 steps with a railing? : Total 6 Click Score: 10    End of Session Equipment Utilized During Treatment: Gait belt Activity Tolerance: Patient limited by fatigue;Other (comment) (limited by fear of falling) Patient left: in chair;with call bell/phone within reach;with chair alarm set;with family/visitor present   PT Visit Diagnosis: Unsteadiness on feet (R26.81);Other abnormalities of gait and mobility (R26.89);Muscle weakness (generalized) (M62.81);Difficulty in walking, not elsewhere classified (R26.2)     Time: 0768-0881 PT Time Calculation (min) (ACUTE ONLY): 28 min  Charges:  $Therapeutic Activity: 23-37 mins                     Moishe Spice, PT, DPT Acute Rehabilitation Services  Office: [phone]    Orvan Falconer 10/18/2021, 4:31 PM

## 2021-10-18 NOTE — Progress Notes (Signed)
PROGRESS NOTE    [name]  GNO:[id number] DOB: [birth year] DOA: 10/13/2021 PCP: [name], NP  87/F chronically ill morbidly obese female with history of chronic diastolic CHF, CAD,  NSTEMI 02/2020 s/p DES to RCA), COPD, CKD stage IIIa, IDDM, HTN, HLD, anemia of chronic disease, prior PE/DVT, left breast cancer, depression/anxiety, chronic pain, memory deficits who presented to the ED for evaluation of shortness of breath.  She is debilitated and wheelchair-bound at baseline, spends most of her time in bed, able to transfer to bedside commode for BMs. -Presented to the ED 6/8 with worsening dyspnea on exertion, orthopnea and cough. She is on Lasix 20 Mg PRN at baseline, unsure if she was taking this as her daughter dispenses her medications. in the ED labs noted creatinine of 1.1, BNP 116, WBC of 16 K, chest x-ray noted low lung volumes and pulmonary vascular congestion with basilar atelectasis. -Improving on diuretics and antibiotics for bronchitis/early PNA and UTI  Subjective: -Feels better today, breathing is improving, intermittent productive cough  Assessment and Plan:  Acute on chronic diastolic CHF  -At baseline on Lasix 20 Mg PRN only -Recent TTE 07/05/2021 showed EF 55-60%, G1DD. -Diuresed with IV Lasix, unfortunately urine output has been inaccurate, -2.3 L documented and standing weights unreliable as well, creatinine bumped to 2.3 from baseline of 1.6, diuretics held, creatinine improving we will restart Lasix p.o. today  -I do not think she is a good SGLT2 candidate with morbid obesity and bedbound status -Repeat chest x-ray with worsening atelectasis, continue ABX for 5 days total  Bronchitis, early pneumonia Leukocytosis -Afebrile, has a productive cough, white count up to 16 K yesterday, chest x-ray with worsening atelectasis, continue IV ceftriaxone day 2, DuoNebs for pulmonary toilet -Urine culture growing E. coli as well, had some dysuria as well, follow-up  sensitivities, ceftriaxone will cover both  E. coli UTI -See discussion above  Morbid obesity Debility, wheelchair-bound status -Poor prognostic factors, PT eval completed, home health PT recommended  Coronary artery disease of native artery of native heart with stable angina pectoris (HCC) -Stable, continue  aspirin, Plavix, atorvastatin, Imdur.  COPD with chronic bronchitis ([place]) -No further expiratory wheezes, see discussion above, starting ABX, continue DuoNebs, albuterol  Type 2 diabetes mellitus with diabetic neuropathy, with long-term current use of insulin (HCC) -CBGs are stable, continue current dose of glargine, add meal coverage  Essential hypertension Continue home Coreg, Imdur.  AKI on CKD 3a ([place]) -Baseline creatinine around 1.6, 1.7 -Creatinine 2.3 -2 days ago, restarting Lasix today, creatinine down to 1.7 which is her baseline  Anxiety associated with depression Continue home Cymbalta.  Chronic pain syndrome Continue Percocet q12h as needed.  Dyslipidemia associated with type 2 diabetes mellitus (HCC) Continue atorvastatin.  Constipation -Continue laxatives today, having BMs  Mild memory deficits -Continue Namenda she is AAOx3  DVT prophylaxis: Lovenox Code Status: DNR Family Communication: Discussed patient in detail, called and updated daughter 6/13 Disposition Plan: Home Wednesday or Thursday if stable  Consultants:    Procedures:   Antimicrobials:    Objective: Vitals:   10/18/21 0546 10/18/21 0738 10/18/21 0835 10/18/21 1100  BP:  (!) 118/57  (!) 142/67  Pulse:  73 77 84  Resp:  '19 18 19  '$ Temp:  98.3 F (36.8 C)    TempSrc:      SpO2:  97% 98% 95%  Weight: (!) 145.1 kg     Height:        Intake/Output Summary (Last 24 hours) at  10/18/2021 1129 Last data filed at 10/18/2021 1004 Gross per 24 hour  Intake 540 ml  Output 1000 ml  Net -460 ml   Filed Weights   10/16/21 0427 10/17/21 0501 10/18/21 0546  Weight: (!) 144.1 kg  (!) 145.9 kg (!) 145.1 kg    Examination:  General exam: Obese chronically ill female sitting up in bed, AAOx3, no distress HEENT: Neck obese unable to assess JVD CVS: S1-S2, regular rhythm Lungs: Few scattered basilar rhonchi, otherwise clear Abdomen: Soft, obese, nontender, bowel sounds present Extremities: Trace edema  Skin: No rashes Psychiatry:  Mood & affect appropriate.     Data Reviewed:   CBC: Recent Labs  Lab 10/13/21 1107 10/14/21 0221 10/15/21 0316 10/16/21 0129 10/17/21 0115 10/18/21 0320  WBC 16.1* 16.2* 14.5* 12.9* 14.2* 13.8*  NEUTROABS 12.5*  --   --   --   --   --   HGB 10.2* 9.4* 9.4* 9.0* 8.5* 8.9*  HCT 32.4* 30.4* 30.8* 28.5* 27.4* 28.3*  MCV 96.1 96.2 96.6 94.1 95.5 93.7  PLT 254 238 224 215 220 938   Basic Metabolic Panel: Recent Labs  Lab 10/14/21 0221 10/15/21 0316 10/16/21 0129 10/17/21 0115 10/18/21 0320  NA 138 135 133* 132* 135  K 4.3 4.6 4.6 5.1 5.1  CL 106 101 100 101 101  CO2 '25 26 26 24 26  '$ GLUCOSE 230* 152* 200* 191* 181*  BUN 21 25* 35* 40* 44*  CREATININE 1.24* 1.58* 2.30* 2.02* 1.73*  CALCIUM 8.3* 8.4* 8.2* 8.1* 8.4*  MG 1.4*  --  1.7  --   --    GFR: Estimated Creatinine Clearance: 35.7 mL/min (A) (by C-G formula based on SCr of 1.73 mg/dL (H)). Liver Function Tests: Recent Labs  Lab 10/13/21 1107  AST 10*  ALT 9  ALKPHOS 88  BILITOT 0.4  PROT 7.2  ALBUMIN 2.9*   Recent Labs  Lab 10/13/21 1107  LIPASE 30   No results for input(s): "AMMONIA" in the last 168 hours. Coagulation Profile: No results for input(s): "INR", "PROTIME" in the last 168 hours. Cardiac Enzymes: No results for input(s): "CKTOTAL", "CKMB", "CKMBINDEX", "TROPONINI" in the last 168 hours. BNP (last 3 results) No results for input(s): "PROBNP" in the last 8760 hours. HbA1C: No results for input(s): "HGBA1C" in the last 72 hours. CBG: Recent Labs  Lab 10/17/21 1124 10/17/21 1520 10/17/21 2153 10/18/21 0614 10/18/21 1102  GLUCAP  212* 299* 211* 163* 185*   Lipid Profile: No results for input(s): "CHOL", "HDL", "LDLCALC", "TRIG", "CHOLHDL", "LDLDIRECT" in the last 72 hours. Thyroid Function Tests: No results for input(s): "TSH", "T4TOTAL", "FREET4", "T3FREE", "THYROIDAB" in the last 72 hours. Anemia Panel: No results for input(s): "VITAMINB12", "FOLATE", "FERRITIN", "TIBC", "IRON", "RETICCTPCT" in the last 72 hours. Urine analysis:    Component Value Date/Time   COLORURINE YELLOW 10/13/2021 1908   APPEARANCEUR HAZY (A) 10/13/2021 1908   LABSPEC 1.016 10/13/2021 1908   PHURINE 5.0 10/13/2021 1908   GLUCOSEU NEGATIVE 10/13/2021 1908   HGBUR NEGATIVE 10/13/2021 1908   BILIRUBINUR NEGATIVE 10/13/2021 1908   BILIRUBINUR neg 06/03/2019 1116   KETONESUR NEGATIVE 10/13/2021 1908   PROTEINUR 100 (A) 10/13/2021 1908   UROBILINOGEN 0.2 06/03/2019 1116   UROBILINOGEN 0.2 02/16/2018 1514   NITRITE NEGATIVE 10/13/2021 1908   LEUKOCYTESUR SMALL (A) 10/13/2021 1908   Sepsis Labs: '@LABRCNTIP'$ (procalcitonin:4,lacticidven:4)  ) Recent Results (from the past 240 hour(s))  MRSA Next Gen by PCR, Nasal     Status: None   Collection Time: 10/13/21  10:48 PM   Specimen: Nasal Mucosa; Nasal Swab  Result Value Ref Range Status   MRSA by PCR Next Gen NOT DETECTED NOT DETECTED Final    Comment: (NOTE) The GeneXpert MRSA Assay (FDA approved for NASAL specimens only), is one component of a comprehensive MRSA colonization surveillance program. It is not intended to diagnose MRSA infection nor to guide or monitor treatment for MRSA infections. Test performance is not FDA approved in patients less than 51 years old. Performed at [facility], [place] [address] [place], [place] [address]   Urine Culture     Status: Abnormal   Collection Time: 10/15/21  7:57 AM   Specimen: Urine, Clean Catch  Result Value Ref Range Status   Specimen Description URINE, CLEAN CATCH  Final   Special Requests   Final    NONE Performed at [facility], [place] [address] [place], [place] [address]    Culture >=100,000 COLONIES/mL ESCHERICHIA COLI (A)  Final   Report Status 10/17/2021 FINAL  Final   Organism ID, Bacteria ESCHERICHIA COLI (A)  Final      Susceptibility   Escherichia coli - MIC*    AMPICILLIN <=2 SENSITIVE Sensitive     CEFAZOLIN <=4 SENSITIVE Sensitive     CEFEPIME <=0.12 SENSITIVE Sensitive     CEFTRIAXONE <=0.25 SENSITIVE Sensitive     CIPROFLOXACIN >=4 RESISTANT Resistant     GENTAMICIN <=1 SENSITIVE Sensitive     IMIPENEM <=0.25 SENSITIVE Sensitive     NITROFURANTOIN <=16 SENSITIVE Sensitive     TRIMETH/SULFA >=320 RESISTANT Resistant     AMPICILLIN/SULBACTAM <=2 SENSITIVE Sensitive     PIP/TAZO <=4 SENSITIVE Sensitive     * >=100,000 COLONIES/mL ESCHERICHIA COLI     Radiology Studies: No results found.   Scheduled Meds:  anastrozole  1 mg Oral Daily   aspirin EC  81 mg Oral Daily   atorvastatin  80 mg Oral QHS   carvedilol  12.5 mg Oral BID   clopidogrel  75 mg Oral Daily   DULoxetine  60 mg Oral Daily   enoxaparin (LOVENOX) injection  30 mg Subcutaneous Q24H   febuxostat  40 mg Oral Daily   feeding supplement (GLUCERNA SHAKE)  237 mL Oral TID BM   furosemide  40 mg Oral Daily   guaiFENesin  600 mg Oral BID   insulin aspart  0-20 Units Subcutaneous TID WC   insulin aspart  2 Units Subcutaneous TID WC   insulin glargine-yfgn  30 Units Subcutaneous Daily   ipratropium-albuterol  3 mL Nebulization BID   isosorbide mononitrate  60 mg Oral Daily   memantine  10 mg Oral Daily   memantine  5 mg Oral QHS   mometasone-formoterol  2 puff Inhalation BID   multivitamin  1 tablet Oral QHS   pantoprazole  20 mg Oral QAC breakfast   polyethylene glycol  17 g Oral BID   senna-docusate  1 tablet Oral BID   sodium chloride flush  3 mL Intravenous Q12H   Continuous Infusions:  cefTRIAXone (ROCEPHIN)  IV 1 g (10/18/21 1004)      LOS: 4 days    Time spent: 57mn  PDomenic Polite MD [facility]   10/18/2021, 11:29 AM

## 2021-10-18 NOTE — Progress Notes (Signed)
Crcl>30 ml/min with BMI 50, ok to increase prophylaxis lovenox to '70mg'$  SQ qday per Dr. Broadus John.  Onnie Boer, PharmD, BCIDP, AAHIVP, CPP Infectious Disease Pharmacist 10/18/2021 2:08 PM

## 2021-10-18 NOTE — Care Management Important Message (Signed)
Important Message  Patient Details  Name: [name] MRN: [id number] Date of Birth: 12-06-1934   Medicare Important Message Given:  Yes     Orbie Pyo 10/18/2021, 2:40 PM

## 2021-10-18 NOTE — Progress Notes (Signed)
Heart Failure Stewardship Pharmacist Progress Note   PCP: [name], NP PCP-Cardiologist: Ena Dawley, MD    HPI:  [name] with PMH of T2DM, COPD, CAD s/p DES to prox and mid RCA, HF, PE off anticoagulation with worsening anemia, breast cancer, and morbid obesity. She presented to the ED on 6/8 with abdominal pain, headache, shortness of breath, and orthopnea. CXR with vascular congestion and basilar atelectasis. Her last ECHO was done 2/28 and LVEF was 55-60% with G1DD.  Current HF Medications: Diuretic: furosemide 40 mg PO ddaily Beta Blocker: carvedilol 12.5 mg BID Other: Imdur 60 mg daily  Prior to admission HF Medications: Diuretic: furosemide 20 mg daily PRN Beta blocker: carvedilol 12.5 mg BID Other: Imdur 60 mg daily  Pertinent Lab Values: Serum creatinine 1.73, BUN 44, Potassium 5.1, Sodium 135, BNP 116, Magnesium 1.7, A1c 7( 07/04/21)   Vital Signs: Weight: 319 lbs (admission weight: 311 lbs) Blood pressure: 140/60s  Heart rate: 70-80s  I/O: -756m yesterday, net -2.4L   Medication Assistance / Insurance Benefits Check: Does the patient have prescription insurance?  Yes Type of insurance plan: [employer]  Does the patient qualify for medication assistance through manufacturers or grants?   Pending Eligible grants and/or patient assistance programs: pending Medication assistance applications in progress: none  Medication assistance applications approved: none Approved medication assistance renewals will be completed by: pending  Outpatient Pharmacy:  Prior to admission outpatient pharmacy: [facility] Is the patient willing to use [facility] at discharge? Yes Is the patient willing to transition their outpatient pharmacy [facility]   Pending    Assessment: 1. Acute on chronic diastolic CHF (LVEF 558-83%. NYHA class III symptoms. - Agree with restarting PO lasix. Keep K>4 and mag>2 - Continue carvedilol  12.5 mg BID - No SGLT2i with current treatment for ecoli UTI, also wheelchair bound and AKI - Continue Imdur 60 mg daily   Plan: 1) Medication changes recommended at this time: - Agree with changes as above  2) Patient assistance: - Appears she is in the donut hole - Copays currently are >$100 for Jardiance and Farxiga  - Will look into patient assistance options for her if these are added  3)  Education  - To be completed prior to discharge  MKerby Nora PharmD, BCPS Heart Failure SCytogeneticistPhone ([phone]

## 2021-10-19 DIAGNOSIS — I5033 Acute on chronic diastolic (congestive) heart failure: Secondary | ICD-10-CM | POA: Diagnosis not present

## 2021-10-19 LAB — BASIC METABOLIC PANEL
Anion gap: 9 (ref 5–15)
Anion gap: 10 (ref 5–15)
BUN: 49 mg/dL — ABNORMAL HIGH (ref 8–23)
BUN: 47 mg/dL — ABNORMAL HIGH (ref 8–23)
CO2: 24 mmol/L (ref 22–32)
CO2: 22 mmol/L (ref 22–32)
Calcium: 8.5 mg/dL — ABNORMAL LOW (ref 8.9–10.3)
Calcium: 8.7 mg/dL — ABNORMAL LOW (ref 8.9–10.3)
Chloride: 100 mmol/L (ref 98–111)
Chloride: 100 mmol/L (ref 98–111)
Creatinine, Ser: 1.71 mg/dL — ABNORMAL HIGH (ref 0.44–1.00)
Creatinine, Ser: 1.75 mg/dL — ABNORMAL HIGH (ref 0.44–1.00)
GFR, Estimated: 29 mL/min — ABNORMAL LOW (ref >60)
GFR, Estimated: 28 mL/min — ABNORMAL LOW (ref >60)
Glucose, Bld: 212 mg/dL — ABNORMAL HIGH (ref 70–99)
Glucose, Bld: 246 mg/dL — ABNORMAL HIGH (ref 70–99)
Potassium: 5.8 mmol/L — ABNORMAL HIGH (ref 3.5–5.1)
Potassium: 5.2 mmol/L — ABNORMAL HIGH (ref 3.5–5.1)
Sodium: 133 mmol/L — ABNORMAL LOW (ref 135–145)
Sodium: 132 mmol/L — ABNORMAL LOW (ref 135–145)

## 2021-10-19 LAB — GLUCOSE, CAPILLARY
Glucose-Capillary: 168 mg/dL — ABNORMAL HIGH (ref 70–99)
Glucose-Capillary: 185 mg/dL — ABNORMAL HIGH (ref 70–99)
Glucose-Capillary: 309 mg/dL — ABNORMAL HIGH (ref 70–99)
Glucose-Capillary: 195 mg/dL — ABNORMAL HIGH (ref 70–99)

## 2021-10-19 LAB — CBC
HCT: 26.6 % — ABNORMAL LOW (ref 36.0–46.0)
Hemoglobin: 8.4 g/dL — ABNORMAL LOW (ref 12.0–15.0)
MCH: 29.8 pg (ref 26.0–34.0)
MCHC: 31.6 g/dL (ref 30.0–36.0)
MCV: 94.3 fL (ref 80.0–100.0)
Platelets: 240 10*3/uL (ref 150–400)
RBC: 2.82 MIL/uL — ABNORMAL LOW (ref 3.87–5.11)
RDW: 13.8 % (ref 11.5–15.5)
WBC: 14.9 10*3/uL — ABNORMAL HIGH (ref 4.0–10.5)
nRBC: 0 % (ref 0.0–0.2)

## 2021-10-19 MED ORDER — GUAIFENESIN-DM 100-10 MG/5ML PO SYRP
10.0000 mL | ORAL_SOLUTION | Freq: Three times a day (TID) | ORAL | Status: DC
  Administered 2021-10-19 – 2021-10-22 (×7): 10 mL via ORAL
  Filled 2021-10-19 (×7): qty 10

## 2021-10-19 MED ORDER — IPRATROPIUM-ALBUTEROL 0.5-2.5 (3) MG/3ML IN SOLN: 3.0000 mL | Freq: Four times a day (QID) | RESPIRATORY_TRACT | Status: DC | PRN

## 2021-10-19 NOTE — Progress Notes (Signed)
Repeat potassium level-5.2 Md made aware.

## 2021-10-19 NOTE — Plan of Care (Signed)
  Problem: Education: Goal: Ability to demonstrate management of disease process will improve Outcome: Progressing Goal: Ability to verbalize understanding of medication therapies will improve Outcome: Progressing Goal: Individualized Educational Video(s) Outcome: Progressing   Problem: Activity: Goal: Capacity to carry out activities will improve Outcome: Progressing   Problem: Cardiac: Goal: Ability to achieve and maintain adequate cardiopulmonary perfusion will improve Outcome: Progressing   Problem: Education: Goal: Ability to demonstrate management of disease process will improve Outcome: Progressing Goal: Ability to verbalize understanding of medication therapies will improve Outcome: Progressing Goal: Individualized Educational Video(s) Outcome: Progressing   Problem: Activity: Goal: Capacity to carry out activities will improve Outcome: Progressing   Problem: Cardiac: Goal: Ability to achieve and maintain adequate cardiopulmonary perfusion will improve Outcome: Progressing   

## 2021-10-19 NOTE — Progress Notes (Signed)
Weaned off oxygen. Tolerated well.

## 2021-10-19 NOTE — Plan of Care (Signed)
Problem: Education: Goal: Ability to demonstrate management of disease process will improve Outcome: Progressing Goal: Ability to verbalize understanding of medication therapies will improve Outcome: Progressing Goal: Individualized Educational Video(s) Outcome: Progressing   Problem: Activity: Goal: Capacity to carry out activities will improve Outcome: Progressing   Problem: Cardiac: Goal: Ability to achieve and maintain adequate cardiopulmonary perfusion will improve Outcome: Progressing   Problem: Education: Goal: Ability to describe self-care measures that may prevent or decrease complications (Diabetes Survival Skills Education) will improve Outcome: Progressing Goal: Individualized Educational Video(s) Outcome: Progressing   Problem: Coping: Goal: Ability to adjust to condition or change in health will improve Outcome: Progressing   Problem: Fluid Volume: Goal: Ability to maintain a balanced intake and output will improve Outcome: Progressing   Problem: Health Behavior/Discharge Planning: Goal: Ability to identify and utilize available resources and services will improve Outcome: Progressing Goal: Ability to manage health-related needs will improve Outcome: Progressing   Problem: Metabolic: Goal: Ability to maintain appropriate glucose levels will improve Outcome: Progressing   Problem: Nutritional: Goal: Maintenance of adequate nutrition will improve Outcome: Progressing Goal: Progress toward achieving an optimal weight will improve Outcome: Progressing   Problem: Skin Integrity: Goal: Risk for impaired skin integrity will decrease Outcome: Progressing   Problem: Tissue Perfusion: Goal: Adequacy of tissue perfusion will improve Outcome: Progressing   Problem: Education: Goal: Knowledge of General Education information will improve Description: Including pain rating scale, medication(s)/side effects and non-pharmacologic comfort measures Outcome:  Progressing   Problem: Health Behavior/Discharge Planning: Goal: Ability to manage health-related needs will improve Outcome: Progressing   Problem: Clinical Measurements: Goal: Ability to maintain clinical measurements within normal limits will improve Outcome: Progressing Goal: Will remain free from infection Outcome: Progressing Goal: Diagnostic test results will improve Outcome: Progressing Goal: Respiratory complications will improve Outcome: Progressing Goal: Cardiovascular complication will be avoided Outcome: Progressing   Problem: Activity: Goal: Risk for activity intolerance will decrease Outcome: Progressing   Problem: Nutrition: Goal: Adequate nutrition will be maintained Outcome: Progressing   Problem: Coping: Goal: Level of anxiety will decrease Outcome: Progressing   Problem: Elimination: Goal: Will not experience complications related to bowel motility Outcome: Progressing Goal: Will not experience complications related to urinary retention Outcome: Progressing   Problem: Pain Managment: Goal: General experience of comfort will improve Outcome: Progressing   Problem: Safety: Goal: Ability to remain free from injury will improve Outcome: Progressing   Problem: Skin Integrity: Goal: Risk for impaired skin integrity will decrease Outcome: Progressing   Problem: Education: Goal: Knowledge of General Education information will improve Description: Including pain rating scale, medication(s)/side effects and non-pharmacologic comfort measures Outcome: Progressing   Problem: Health Behavior/Discharge Planning: Goal: Ability to manage health-related needs will improve Outcome: Progressing   Problem: Clinical Measurements: Goal: Ability to maintain clinical measurements within normal limits will improve Outcome: Progressing Goal: Will remain free from infection Outcome: Progressing Goal: Diagnostic test results will improve Outcome:  Progressing Goal: Respiratory complications will improve Outcome: Progressing Goal: Cardiovascular complication will be avoided Outcome: Progressing   Problem: Activity: Goal: Risk for activity intolerance will decrease Outcome: Progressing   Problem: Nutrition: Goal: Adequate nutrition will be maintained Outcome: Progressing   Problem: Coping: Goal: Level of anxiety will decrease Outcome: Progressing   Problem: Education: Goal: Ability to demonstrate management of disease process will improve Outcome: Progressing Goal: Ability to verbalize understanding of medication therapies will improve Outcome: Progressing Goal: Individualized Educational Video(s) Outcome: Progressing   Problem: Cardiac: Goal: Ability to achieve and maintain adequate cardiopulmonary perfusion  will improve Outcome: Progressing

## 2021-10-19 NOTE — Progress Notes (Signed)
PROGRESS NOTE    [name]   XYI:[id number]  DOB: 08/09/1934  DOA: 10/13/2021   PCP: [name], NP   Subjective: The patient was seen and examined this morning, stable laying in bed in no complaint with exception of cough mild shortness of breath  Still requiring 2-1 L of oxygen satting 97%   [name] is a 87/F chronically ill morbidly obese female with history of chronic diastolic CHF, CAD,  NSTEMI 02/2020 s/p DES to RCA), COPD, CKD stage IIIa, IDDM, HTN, HLD, anemia of chronic disease, prior PE/DVT, left breast cancer, depression/anxiety, chronic pain, memory deficits who presented to the ED for evaluation of shortness of breath.  She is debilitated and wheelchair-bound at baseline, spends most of her time in bed, able to transfer to bedside commode for BMs. -Presented to the ED 6/8 with worsening dyspnea on exertion, orthopnea and cough. She is on Lasix 20 Mg PRN at baseline, unsure if she was taking this as her daughter dispenses her medications. in the ED labs noted creatinine of 1.1, BNP 116, WBC of 16 K, chest x-ray noted low lung volumes and pulmonary vascular congestion with basilar atelectasis. -Improving on diuretics and antibiotics for bronchitis/early PNA and UTI    Assessment and Plan:  Acute on chronic diastolic CHF  -At baseline on Lasix 20 Mg PRN only -Recent TTE 07/05/2021 showed EF 55-60%, G1DD.  Intake/Output Summary (Last 24 hours) at 10/19/2021 1315 Last data filed at 10/19/2021 2707 Gross per 24 hour  Intake 627 ml  Output --  Net 627 ml   Filed Weights   10/17/21 0501 10/18/21 0546 10/19/21 0500  Weight: (!) 145.9 kg (!) 145.1 kg (!) 146.2 kg    -Diuresed with IV Lasix, unfortunately urine output has been inaccurate, -2.3 L documented and standing weights unreliable as well, creatinine bumped to 2.3 from baseline of 1.6, diuretics held, creatinine improving we will restart Lasix p.o.  -She is not a good SGLT2 candidate with morbid  obesity and bedbound status -Repeat chest x-ray with worsening atelectasis, continue ABX for 5 days total  Bronchitis, early pneumonia Leukocytosis -Afebrile, has a productive cough, white count up to 16 K>>> 14.6  - chest x-ray with worsening atelectasis, continue IV ceftriaxone day 2, DuoNebs for pulmonary toilet -Urine culture growing E. coli as well, had some dysuria as well, follow-up sensitivities, ceftriaxone will cover both  E. coli UTI -See discussion above  Morbid obesity Debility, wheelchair-bound status -Poor prognostic factors, PT eval completed, home health PT recommended Stable   Coronary artery disease of native artery of native heart with stable angina pectoris (HCC) -stable - on:  aspirin, Plavix, atorvastatin, Imdur.  COPD with chronic bronchitis ([place]) -No further expiratory wheezes, see discussion above, starting ABX, continue DuoNebs, albuterol -Currently requiring 2-1 L of oxygen, satting 97% weaning off oxygen  Type 2 diabetes mellitus with diabetic neuropathy, with long-term current use of insulin (HCC) -CBGs are stable, continue current dose of glargine, add meal coverage  Essential hypertension Continue home Coreg, Imdur.  AKI on CKD 3a ([place]) -Baseline creatinine around 1.6, 1.7 -Creatinine 2.3 -2 days ago, restarting Lasix today, creatinine down to 1.7 which is her baseline  Anxiety associated with depression Continue home Cymbalta.  Chronic pain syndrome Continue Percocet q12h as needed.  Dyslipidemia associated with type 2 diabetes mellitus (HCC) Continue atorvastatin.  Constipation -Continue laxatives, having BMs  Mild memory deficits -Continue Namenda she is AAOx3  DVT prophylaxis: Lovenox Code Status: DNR Family Communication: Discussed  patient in detail, called and updated daughter 6/13 Disposition Plan: Home Wednesday or Thursday if stable  Consultants:    Procedures:   Antimicrobials: Rocephin   Objective: Vitals:    10/19/21 0717 10/19/21 0718 10/19/21 0854 10/19/21 1052  BP: (!) 124/55   (!) 119/57  Pulse:   76   Resp: '17  16 18  '$ Temp: 98.6 F (37 C)   98 F (36.7 C)  TempSrc: Oral   Oral  SpO2: 96% 96% 97% 97%  Weight:      Height:        Intake/Output Summary (Last 24 hours) at 10/19/2021 1315 Last data filed at 10/19/2021 5885 Gross per 24 hour  Intake 627 ml  Output --  Net 627 ml   Filed Weights   10/17/21 0501 10/18/21 0546 10/19/21 0500  Weight: (!) 145.9 kg (!) 145.1 kg (!) 146.2 kg       Physical Exam:   General:  AAO x 3,  cooperative, no distress;   HEENT:  Normocephalic, PERRL, otherwise with in Normal limits   Neuro:  CNII-XII intact. , normal motor and sensation, reflexes intact   Lungs:   Clear to auscultation BL, Respirations unlabored,  No wheezes / crackles  Cardio:    S1/S2, RRR, No murmure, No Rubs or Gallops   Abdomen:  Soft, non-tender, bowel sounds active all four quadrants, no guarding or peritoneal signs.  Muscular  skeletal:  Limited exam -global generalized weaknesses - in bed, able to move all 4 extremities,   2+ pulses,  symmetric, No pitting edema  Skin:  Dry, warm to touch, negative for any Rashes,  Wounds: Please see nursing documentation          Data Reviewed:   CBC: Recent Labs  Lab 10/13/21 1107 10/14/21 0221 10/15/21 0316 10/16/21 0129 10/17/21 0115 10/18/21 0320 10/19/21 0308  WBC 16.1*   < > 14.5* 12.9* 14.2* 13.8* 14.9*  NEUTROABS 12.5*  --   --   --   --   --   --   HGB 10.2*   < > 9.4* 9.0* 8.5* 8.9* 8.4*  HCT 32.4*   < > 30.8* 28.5* 27.4* 28.3* 26.6*  MCV 96.1   < > 96.6 94.1 95.5 93.7 94.3  PLT 254   < > 224 215 220 211 240   < > = values in this interval not displayed.   Basic Metabolic Panel: Recent Labs  Lab 10/14/21 0221 10/15/21 0316 10/16/21 0129 10/17/21 0115 10/18/21 0320 10/19/21 0308  NA 138 135 133* 132* 135 133*  K 4.3 4.6 4.6 5.1 5.1 5.8*  CL 106 101 100 101 101 100  CO2 '25 26 26 24 26 24   '$ GLUCOSE 230* 152* 200* 191* 181* 212*  BUN 21 25* 35* 40* 44* 49*  CREATININE 1.24* 1.58* 2.30* 2.02* 1.73* 1.71*  CALCIUM 8.3* 8.4* 8.2* 8.1* 8.4* 8.5*  MG 1.4*  --  1.7  --   --   --    GFR: Estimated Creatinine Clearance: 36.2 mL/min (A) (by C-G formula based on SCr of 1.71 mg/dL (H)). Liver Function Tests: Recent Labs  Lab 10/13/21 1107  AST 10*  ALT 9  ALKPHOS 88  BILITOT 0.4  PROT 7.2  ALBUMIN 2.9*   Recent Labs  Lab 10/13/21 1107  LIPASE 30    CBG: Recent Labs  Lab 10/18/21 1102 10/18/21 1634 10/18/21 2104 10/19/21 0612 10/19/21 1051  GLUCAP 185* 319* 253* 168* 185*   s:  Component Value Date/Time   COLORURINE YELLOW 10/13/2021 1908   APPEARANCEUR HAZY (A) 10/13/2021 1908   LABSPEC 1.016 10/13/2021 1908   PHURINE 5.0 10/13/2021 1908   GLUCOSEU NEGATIVE 10/13/2021 1908   HGBUR NEGATIVE 10/13/2021 1908   BILIRUBINUR NEGATIVE 10/13/2021 1908   BILIRUBINUR neg 06/03/2019 1116   KETONESUR NEGATIVE 10/13/2021 1908   PROTEINUR 100 (A) 10/13/2021 1908   UROBILINOGEN 0.2 06/03/2019 1116   UROBILINOGEN 0.2 02/16/2018 1514   NITRITE NEGATIVE 10/13/2021 1908   LEUKOCYTESUR SMALL (A) 10/13/2021 1908   Sepsis Labs: '@LABRCNTIP'$ (procalcitonin:4,lacticidven:4)  ) Recent Results (from the past 240 hour(s))  MRSA Next Gen by PCR, Nasal     Status: None   Collection Time: 10/13/21 10:48 PM   Specimen: Nasal Mucosa; Nasal Swab  Result Value Ref Range Status   MRSA by PCR Next Gen NOT DETECTED NOT DETECTED Final    Comment: (NOTE) The GeneXpert MRSA Assay (FDA approved for NASAL specimens only), is one component of a comprehensive MRSA colonization surveillance program. It is not intended to diagnose MRSA infection nor to guide or monitor treatment for MRSA infections. Test performance is not FDA approved in patients less than 53 years old. Performed at [facility], [place] [address] [place], [place] [address]   Urine Culture     Status: Abnormal    Collection Time: 10/15/21  7:57 AM   Specimen: Urine, Clean Catch  Result Value Ref Range Status   Specimen Description URINE, CLEAN CATCH  Final   Special Requests   Final    NONE Performed at [facility], [place] [name], [place],  [address]    Culture >=100,000 COLONIES/mL ESCHERICHIA COLI (A)  Final   Report Status 10/17/2021 FINAL  Final   Organism ID, Bacteria ESCHERICHIA COLI (A)  Final      Susceptibility   Escherichia coli - MIC*    AMPICILLIN <=2 SENSITIVE Sensitive     CEFAZOLIN <=4 SENSITIVE Sensitive     CEFEPIME <=0.12 SENSITIVE Sensitive     CEFTRIAXONE <=0.25 SENSITIVE Sensitive     CIPROFLOXACIN >=4 RESISTANT Resistant     GENTAMICIN <=1 SENSITIVE Sensitive     IMIPENEM <=0.25 SENSITIVE Sensitive     NITROFURANTOIN <=16 SENSITIVE Sensitive     TRIMETH/SULFA >=320 RESISTANT Resistant     AMPICILLIN/SULBACTAM <=2 SENSITIVE Sensitive     PIP/TAZO <=4 SENSITIVE Sensitive     * >=100,000 COLONIES/mL ESCHERICHIA COLI     Radiology Studies: No results found.   Scheduled Meds:  anastrozole  1 mg Oral Daily   aspirin EC  81 mg Oral Daily   atorvastatin  80 mg Oral QHS   carvedilol  12.5 mg Oral BID   clopidogrel  75 mg Oral Daily   DULoxetine  60 mg Oral Daily   enoxaparin (LOVENOX) injection  70 mg Subcutaneous Q24H   febuxostat  40 mg Oral Daily   feeding supplement (GLUCERNA SHAKE)  237 mL Oral TID BM   furosemide  40 mg Oral Daily   guaiFENesin  600 mg Oral BID   insulin aspart  0-20 Units Subcutaneous TID WC   insulin aspart  2 Units Subcutaneous TID WC   insulin glargine-yfgn  30 Units Subcutaneous Daily   isosorbide mononitrate  60 mg Oral Daily   memantine  10 mg Oral Daily   memantine  5 mg Oral QHS   mometasone-formoterol  2 puff Inhalation BID   multivitamin  1 tablet Oral QHS   pantoprazole  20  mg Oral QAC breakfast   polyethylene glycol  17 g Oral BID   senna-docusate  1 tablet Oral BID   sodium chloride flush  3 mL Intravenous  Q12H   Continuous Infusions:  cefTRIAXone (ROCEPHIN)  IV 1 g (10/19/21 0834)      LOS: 5 days    Time spent: 75mn   SIGNED: SDeatra James MD, FHM. [facility],  Pager (please use [email or web address] to page/text)  Please use Epic Secure Chat for non-urgent communication (7AM-7PM) If 7PM-7AM, please contact night-coverage [email or web address],  10/19/2021, 1:16 PM    10/19/2021, 1:15 PM

## 2021-10-19 NOTE — Progress Notes (Signed)
Heart Failure Stewardship Pharmacist Progress Note   PCP: [name], NP PCP-Cardiologist: Ena Dawley, MD    HPI:  [name] with PMH of T2DM, COPD, CAD s/p DES to prox and mid RCA, HF, PE off anticoagulation with worsening anemia, breast cancer, and morbid obesity. She presented to the ED on 6/8 with abdominal pain, headache, shortness of breath, and orthopnea. CXR with vascular congestion and basilar atelectasis. Her last ECHO was done 2/28 and LVEF was 55-60% with G1DD.  Current HF Medications: Diuretic: furosemide 40 mg PO daily Beta Blocker: carvedilol 12.5 mg BID Other: Imdur 60 mg daily  Prior to admission HF Medications: Diuretic: furosemide 20 mg daily PRN Beta blocker: carvedilol 12.5 mg BID Other: Imdur 60 mg daily  Pertinent Lab Values: Serum creatinine 1.71, BUN 49, Potassium 5.8 (no hemolysis), Sodium 133, BNP 116, Magnesium 1.7, A1c 7( 07/04/21)   Vital Signs: Weight: 322 lbs (admission weight: 311 lbs) Blood pressure: 120/50s  Heart rate: 70-80s  I/O: -745m yesterday, net -1.8L   Medication Assistance / Insurance Benefits Check: Does the patient have prescription insurance?  Yes Type of insurance plan: [employer]  Does the patient qualify for medication assistance through manufacturers or grants?   Pending Eligible grants and/or patient assistance programs: pending Medication assistance applications in progress: none  Medication assistance applications approved: none Approved medication assistance renewals will be completed by: pending  Outpatient Pharmacy:  [facility] Yes Is the patient [facility]   Pending    Assessment: 1. Acute on chronic diastolic CHF (LVEF 500-93%. NYHA class III symptoms. - Continue furosemide 40 mg PO daily. Keep K>4 and mag>2. K is  elevated 5.8 today without hemolysis. Consider giving Lokelma 5g x 1 today. - Continue carvedilol 12.5 mg BID - No SGLT2i with current treatment for ecoli UTI, also wheelchair bound and AKI - Continue Imdur 60 mg daily   Plan: 1) Medication changes recommended at this time: - Lokelma 5g x 1 today  2) Patient assistance: - Appears she is in the donut hole - Copays currently are >$100 for Jardiance and Farxiga  - Will look into patient assistance options for her if these are added  3)  Education  - To be completed prior to discharge  MKerby Nora PharmD, BCPS Heart Failure SCytogeneticistPhone ([phone]

## 2021-10-19 NOTE — Progress Notes (Signed)
Potassium level 5.8 MD made aware with order to repeat BMET.

## 2021-10-20 ENCOUNTER — Other Ambulatory Visit: Payer: Self-pay

## 2021-10-20 DIAGNOSIS — E785 Hyperlipidemia, unspecified: Secondary | ICD-10-CM

## 2021-10-20 DIAGNOSIS — E875 Hyperkalemia: Secondary | ICD-10-CM

## 2021-10-20 DIAGNOSIS — Z794 Long term (current) use of insulin: Secondary | ICD-10-CM

## 2021-10-20 DIAGNOSIS — J449 Chronic obstructive pulmonary disease, unspecified: Secondary | ICD-10-CM

## 2021-10-20 DIAGNOSIS — N1831 Chronic kidney disease, stage 3a: Secondary | ICD-10-CM

## 2021-10-20 DIAGNOSIS — I130 Hypertensive heart and chronic kidney disease with heart failure and stage 1 through stage 4 chronic kidney disease, or unspecified chronic kidney disease: Secondary | ICD-10-CM | POA: Diagnosis not present

## 2021-10-20 DIAGNOSIS — F418 Other specified anxiety disorders: Secondary | ICD-10-CM

## 2021-10-20 DIAGNOSIS — G894 Chronic pain syndrome: Secondary | ICD-10-CM | POA: Diagnosis not present

## 2021-10-20 DIAGNOSIS — Z66 Do not resuscitate: Secondary | ICD-10-CM | POA: Diagnosis not present

## 2021-10-20 DIAGNOSIS — I5033 Acute on chronic diastolic (congestive) heart failure: Secondary | ICD-10-CM | POA: Diagnosis not present

## 2021-10-20 DIAGNOSIS — I25118 Atherosclerotic heart disease of native coronary artery with other forms of angina pectoris: Secondary | ICD-10-CM

## 2021-10-20 DIAGNOSIS — E1140 Type 2 diabetes mellitus with diabetic neuropathy, unspecified: Secondary | ICD-10-CM

## 2021-10-20 DIAGNOSIS — E1169 Type 2 diabetes mellitus with other specified complication: Secondary | ICD-10-CM

## 2021-10-20 DIAGNOSIS — I10 Essential (primary) hypertension: Secondary | ICD-10-CM

## 2021-10-20 DIAGNOSIS — J189 Pneumonia, unspecified organism: Secondary | ICD-10-CM | POA: Diagnosis not present

## 2021-10-20 LAB — GLUCOSE, CAPILLARY
Glucose-Capillary: 174 mg/dL — ABNORMAL HIGH (ref 70–99)
Glucose-Capillary: 215 mg/dL — ABNORMAL HIGH (ref 70–99)
Glucose-Capillary: 234 mg/dL — ABNORMAL HIGH (ref 70–99)
Glucose-Capillary: 196 mg/dL — ABNORMAL HIGH (ref 70–99)

## 2021-10-20 LAB — CBC
HCT: 28.6 % — ABNORMAL LOW (ref 36.0–46.0)
Hemoglobin: 8.9 g/dL — ABNORMAL LOW (ref 12.0–15.0)
MCH: 29.5 pg (ref 26.0–34.0)
MCHC: 31.1 g/dL (ref 30.0–36.0)
MCV: 94.7 fL (ref 80.0–100.0)
Platelets: 241 10*3/uL (ref 150–400)
RBC: 3.02 MIL/uL — ABNORMAL LOW (ref 3.87–5.11)
RDW: 13.8 % (ref 11.5–15.5)
WBC: 14.7 10*3/uL — ABNORMAL HIGH (ref 4.0–10.5)
nRBC: 0 % (ref 0.0–0.2)

## 2021-10-20 LAB — BASIC METABOLIC PANEL
Anion gap: 11 (ref 5–15)
Anion gap: 11 (ref 5–15)
BUN: 48 mg/dL — ABNORMAL HIGH (ref 8–23)
BUN: 46 mg/dL — ABNORMAL HIGH (ref 8–23)
CO2: 22 mmol/L (ref 22–32)
CO2: 24 mmol/L (ref 22–32)
Calcium: 8.6 mg/dL — ABNORMAL LOW (ref 8.9–10.3)
Calcium: 9.2 mg/dL (ref 8.9–10.3)
Chloride: 100 mmol/L (ref 98–111)
Chloride: 99 mmol/L (ref 98–111)
Creatinine, Ser: 1.73 mg/dL — ABNORMAL HIGH (ref 0.44–1.00)
Creatinine, Ser: 1.69 mg/dL — ABNORMAL HIGH (ref 0.44–1.00)
GFR, Estimated: 29 mL/min — ABNORMAL LOW (ref >60)
GFR, Estimated: 29 mL/min — ABNORMAL LOW (ref >60)
Glucose, Bld: 145 mg/dL — ABNORMAL HIGH (ref 70–99)
Glucose, Bld: 283 mg/dL — ABNORMAL HIGH (ref 70–99)
Potassium: 6.2 mmol/L — ABNORMAL HIGH (ref 3.5–5.1)
Potassium: 6.3 mmol/L (ref 3.5–5.1)
Sodium: 133 mmol/L — ABNORMAL LOW (ref 135–145)
Sodium: 134 mmol/L — ABNORMAL LOW (ref 135–145)

## 2021-10-20 MED ORDER — SODIUM POLYSTYRENE SULFONATE 15 GM/60ML PO SUSP
30.0000 g | Freq: Once | ORAL | Status: AC
Start: 2021-10-20 — End: 2021-10-20
  Administered 2021-10-20: 30 g via ORAL
  Filled 2021-10-20: qty 120

## 2021-10-20 MED ORDER — SODIUM ZIRCONIUM CYCLOSILICATE 5 G PO PACK
5.0000 g | PACK | Freq: Every day | ORAL | Status: AC
  Administered 2021-10-20: 5 g via ORAL
  Filled 2021-10-20: qty 1

## 2021-10-20 MED ORDER — INSULIN ASPART 100 UNIT/ML IV SOLN
10.0000 [IU] | Freq: Once | INTRAVENOUS | Status: AC
  Administered 2021-10-20: 10 [IU] via INTRAVENOUS

## 2021-10-20 MED ORDER — DEXTROSE 50 % IV SOLN
1.0000 | Freq: Once | INTRAVENOUS | Status: AC
  Administered 2021-10-20: 50 mL via INTRAVENOUS
  Filled 2021-10-20: qty 50

## 2021-10-20 MED ORDER — CALCIUM GLUCONATE-NACL 1-0.675 GM/50ML-% IV SOLN
1.0000 g | Freq: Once | INTRAVENOUS | Status: AC
Start: 2021-10-20 — End: 2021-10-20
  Administered 2021-10-20: 1000 mg via INTRAVENOUS
  Filled 2021-10-20: qty 50

## 2021-10-20 NOTE — Progress Notes (Signed)
Heart Failure Stewardship Pharmacist Progress Note   PCP: [name], NP PCP-Cardiologist: Ena Dawley, MD    HPI:  [name] with PMH of T2DM, COPD, CAD s/p DES to prox and mid RCA, HF, PE off anticoagulation with worsening anemia, breast cancer, and morbid obesity. She presented to the ED on 6/8 with abdominal pain, headache, shortness of breath, and orthopnea. CXR with vascular congestion and basilar atelectasis. Her last ECHO was done 2/28 and LVEF was 55-60% with G1DD.  Current HF Medications: Diuretic: furosemide 40 mg PO daily Beta Blocker: carvedilol 12.5 mg BID Other: Imdur 60 mg daily  Prior to admission HF Medications: Diuretic: furosemide 20 mg daily PRN Beta blocker: carvedilol 12.5 mg BID Other: Imdur 60 mg daily  Pertinent Lab Values: Serum creatinine 1.73, BUN 48, Potassium 6.2 (no hemolysis), Sodium 133, BNP 116, Magnesium 1.7, A1c 7( 07/04/21)   Vital Signs: Weight: 322 lbs (admission weight: 311 lbs) Blood pressure: 120-140/50s  Heart rate: 70-80s  I/O: -1L yesterday, net -2.4L   Medication Assistance / Insurance Benefits Check: Does the patient have prescription insurance?  Yes Type of insurance plan: [employer]  Does the patient qualify for medication assistance through manufacturers or grants?   Pending Eligible grants and/or patient assistance programs: pending Medication assistance applications in progress: none  Medication assistance applications approved: none Approved medication assistance renewals will be completed by: pending  Outpatient Pharmacy:  Prior to [facility]: [facility] Is the patient willing to use [facility] at discharge? Yes Is the patient willing to transition their outpatient pharmacy to utilize a [facility] pharmacy?   Pending    Assessment: 1. Acute on chronic diastolic CHF (LVEF 28-76%). NYHA class III symptoms. - Continue furosemide 40 mg PO daily. Keep K>4 and mag>2. K  is elevated 6.2 today without hemolysis. Consider giving Lokelma 5g x 1 today. - Continue carvedilol 12.5 mg BID - No SGLT2i with current treatment for ecoli UTI, also wheelchair bound and AKI - Continue Imdur 60 mg daily   Plan: 1) Medication changes recommended at this time: - Lokelma 5g x 1 today  2) Patient assistance: - Appears she is in the donut hole - Copays currently are >$100 for Jardiance and Farxiga  - Will look into patient assistance options for her if these are added  3)  Education  - To be completed prior to discharge  [name], PharmD, BCPS Heart Failure Cytogeneticist Phone [phone]

## 2021-10-20 NOTE — Progress Notes (Signed)
PROGRESS NOTE    [name]   OJJ:[id number]  DOB: 09-01-1934  DOA: 10/13/2021   PCP: [name], NP   Subjective: The patient was seen and examined this morning, stable laying in bed in no complaint with exception of cough mild shortness of breath  Still requiring 2-1 L of oxygen satting 97%   [name] is a 87/F chronically ill morbidly obese female with history of chronic diastolic CHF, CAD,  NSTEMI 02/2020 s/p DES to RCA), COPD, CKD stage IIIa, IDDM, HTN, HLD, anemia of chronic disease, prior PE/DVT, left breast cancer, depression/anxiety, chronic pain, memory deficits who presented to the ED for evaluation of shortness of breath.  She is debilitated and wheelchair-bound at baseline, spends most of her time in bed, able to transfer to bedside commode for BMs. -Presented to the ED 6/8 with worsening dyspnea on exertion, orthopnea and cough. She is on Lasix 20 Mg PRN at baseline, unsure if she was taking this as her daughter dispenses her medications. in the ED labs noted creatinine of 1.1, BNP 116, WBC of 16 K, chest x-ray noted low lung volumes and pulmonary vascular congestion with basilar atelectasis. -Improving on diuretics and antibiotics for bronchitis/early PNA and UTI. -today with hyperkalemia; but reporting improvement in her breathing.    Assessment and Plan:  Acute on chronic diastolic CHF  -At baseline on Lasix 20 Mg PRN only -Recent TTE 07/05/2021 showed EF 55-60%, G1DD. -intake/output not accurate but overall approx 5.3 L since admission. -continue current dose of oral diuretics. -She is not a good SGLT2 candidate with morbid obesity and bedbound status -most recent Repeat chest x-ray with worsening atelectasis, continue ABX for 5 days total. -continue the use of flutter valve/IS -follow daily weights and strict intake/output.  Bronchitis, early pneumonia Leukocytosis -Afebrile, has a productive cough, white count up to 16 K>>> 14.6; and  trending down now. - chest x-ray with worsening atelectasis, continue IV ceftriaxone day 2, DuoNebs for pulmonary toilet -Urine culture growing E. coli as well, had some dysuria as well, follow-up sensitivities, ceftriaxone will cover both. -continue the use of flutter valve and IS.  E. coli UTI -continue current antibiotics. -maintain adequate oral hydration.  Morbid obesity Debility, wheelchair-bound status -Poor prognostic factors, PT eval completed, home health PT recommended -Body mass index is 50.52 kg/m. -low calorie diet and portion control discussed with patient.  Coronary artery disease of native artery of native heart with stable angina pectoris ([place]) -stable - on:  aspirin, Plavix, atorvastatin, Imdur. -denying CP.   COPD with chronic bronchitis ([place]) -No further expiratory wheezes, see discussion above, starting ABX, continue DuoNebs, albuterol -Currently requiring 1-2 L of oxygen, satting 97%  -continue weaning off oxygen -as mentioned above will continue flutter valve and IS.  Type 2 diabetes mellitus with diabetic neuropathy, with long-term current use of insulin (HCC) -CBGs are stable, continue current dose of glargine -continue SSI and meal coverage.  Essential hypertension -Continue home Coreg, Imdur. -heart healthy diet discussed with patient.  AKI on CKD 3a ([place]) -Baseline creatinine around 1.6, 1.7 -Creatinine 2.3 after acute diuresis -down to 1.6-1.7 -resuming oral lasix now. -continue to follow trend.  Hyperkalemia -will give lokelma and calcium gluconate -continue SSI -follow K trend -continue telemetry.  Anxiety associated with depression -Continue home Cymbalta. -mood stable.  Chronic pain syndrome -Continue Percocet q12h as needed.  Dyslipidemia associated with type 2 diabetes mellitus (HCC) -Continue atorvastatin.  Constipation -Continue laxatives and current bowel regimen.  Mild memory deficits -  Continue Namenda she is  AAOx3 -continue constant reorientation.  DVT prophylaxis: Lovenox Code Status: DNR Family Communication: Discussed patient in detail, called and updated daughter 6/13 Disposition Plan: Home in the next 24-48 hours.  Consultants:  None   Procedures: see below for x-ray reports.  Antimicrobials: Rocephin   Objective: Vitals:   10/20/21 0725 10/20/21 0758 10/20/21 1122 10/20/21 1627  BP: (!) 116/52  (!) 104/43 (!) 145/52  Pulse:    78  Resp: '20  16 19  '$ Temp:   98.1 F (36.7 C) 98.1 F (36.7 C)  TempSrc:   Oral Oral  SpO2:  92% 97% 99%  Weight:      Height:        Intake/Output Summary (Last 24 hours) at 10/20/2021 1806 Last data filed at 10/20/2021 1600 Gross per 24 hour  Intake 480 ml  Output 1250 ml  Net -770 ml   Filed Weights   10/18/21 0546 10/19/21 0500 10/20/21 0413  Weight: (!) 145.1 kg (!) 146.2 kg (!) 146.3 kg     Physical Exam: General exam: Alert, awake, oriented x 3; following commands appropriately. Denying CP and expressing improvement in her breathing. Respiratory system: decrease BS at the bases, no using accessory muscles. Positive rhonchi.  Cardiovascular system:Rate controlled, no rubs, no gallops; unable to assess JVD with body habitus. Gastrointestinal system: Abdomen is obese, nondistended, soft and nontender. No organomegaly or masses felt. Normal bowel sounds heard. Central nervous system: No focal neurological deficits. Extremities: No Cyanosis, no clubbing. Positive 2 +edema bilaterally. Skin: No rashes, no petechiae. Psychiatry: Judgement and insight appear normal.   Data Reviewed:   CBC: Recent Labs  Lab 10/16/21 0129 10/17/21 0115 10/18/21 0320 10/19/21 0308 10/20/21 0047  WBC 12.9* 14.2* 13.8* 14.9* 14.7*  HGB 9.0* 8.5* 8.9* 8.4* 8.9*  HCT 28.5* 27.4* 28.3* 26.6* 28.6*  MCV 94.1 95.5 93.7 94.3 94.7  PLT 215 220 211 240 572   Basic Metabolic Panel: Recent Labs  Lab 10/14/21 0221 10/15/21 0316 10/16/21 0129  10/17/21 0115 10/18/21 0320 10/19/21 0308 10/19/21 1231 10/20/21 0047  NA 138   < > 133* 132* 135 133* 132* 133*  K 4.3   < > 4.6 5.1 5.1 5.8* 5.2* 6.2*  CL 106   < > 100 101 101 100 100 100  CO2 25   < > '26 24 26 24 22 22  '$ GLUCOSE 230*   < > 200* 191* 181* 212* 246* 145*  BUN 21   < > 35* 40* 44* 49* 47* 48*  CREATININE 1.24*   < > 2.30* 2.02* 1.73* 1.71* 1.75* 1.73*  CALCIUM 8.3*   < > 8.2* 8.1* 8.4* 8.5* 8.7* 8.6*  MG 1.4*  --  1.7  --   --   --   --   --    < > = values in this interval not displayed.   GFR: Estimated Creatinine Clearance: 35.8 mL/min (A) (by C-G formula based on SCr of 1.73 mg/dL (H)).   CBG: Recent Labs  Lab 10/19/21 1602 10/19/21 2112 10/20/21 0623 10/20/21 1119 10/20/21 1625  GLUCAP 309* 195* 174* 215* 234*   s:    Component Value Date/Time   COLORURINE YELLOW 10/13/2021 1908   APPEARANCEUR HAZY (A) 10/13/2021 1908   LABSPEC 1.016 10/13/2021 1908   PHURINE 5.0 10/13/2021 1908   GLUCOSEU NEGATIVE 10/13/2021 [place] NEGATIVE 10/13/2021 [place] NEGATIVE 10/13/2021 1908   BILIRUBINUR neg 06/03/2019 [place] 10/13/2021 1908  PROTEINUR 100 (A) 10/13/2021 1908   UROBILINOGEN 0.2 06/03/2019 1116   UROBILINOGEN 0.2 02/16/2018 1514   NITRITE NEGATIVE 10/13/2021 1908   LEUKOCYTESUR SMALL (A) 10/13/2021 1908   Sepsis Labs:  Recent Results (from the past 240 hour(s))  MRSA Next Gen by PCR, Nasal     Status: None   Collection Time: 10/13/21 10:48 PM   Specimen: Nasal Mucosa; Nasal Swab  Result Value Ref Range Status   MRSA by PCR Next Gen NOT DETECTED NOT DETECTED Final    Comment: (NOTE) The GeneXpert MRSA Assay (FDA approved for NASAL specimens only), is one component of a comprehensive MRSA colonization surveillance program. It is not intended to diagnose MRSA infection nor to guide or monitor treatment for MRSA infections. Test performance is not FDA approved in patients less than 29 years old. Performed  at [facility], [place] [address], [place], [place] [address]   Urine Culture     Status: Abnormal   Collection Time: 10/15/21  7:57 AM   Specimen: Urine, Clean Catch  Result Value Ref Range Status   Specimen Description URINE, CLEAN CATCH  Final   Special Requests   Final    NONE Performed at [facility], [place] [address], [place], [place] [address]    Culture >=100,000 COLONIES/mL ESCHERICHIA COLI (A)  Final   Report Status 10/17/2021 FINAL  Final   Organism ID, Bacteria ESCHERICHIA COLI (A)  Final      Susceptibility   Escherichia coli - MIC*    AMPICILLIN <=2 SENSITIVE Sensitive     CEFAZOLIN <=4 SENSITIVE Sensitive     CEFEPIME <=0.12 SENSITIVE Sensitive     CEFTRIAXONE <=0.25 SENSITIVE Sensitive     CIPROFLOXACIN >=4 RESISTANT Resistant     GENTAMICIN <=1 SENSITIVE Sensitive     IMIPENEM <=0.25 SENSITIVE Sensitive     NITROFURANTOIN <=16 SENSITIVE Sensitive     TRIMETH/SULFA >=320 RESISTANT Resistant     AMPICILLIN/SULBACTAM <=2 SENSITIVE Sensitive     PIP/TAZO <=4 SENSITIVE Sensitive     * >=100,000 COLONIES/mL ESCHERICHIA COLI     Radiology Studies: No results found.   Scheduled Meds:  anastrozole  1 mg Oral Daily   aspirin EC  81 mg Oral Daily   atorvastatin  80 mg Oral QHS   carvedilol  12.5 mg Oral BID   clopidogrel  75 mg Oral Daily   DULoxetine  60 mg Oral Daily   enoxaparin (LOVENOX) injection  70 mg Subcutaneous Q24H   febuxostat  40 mg Oral Daily   feeding supplement (GLUCERNA SHAKE)  237 mL Oral TID BM   furosemide  40 mg Oral Daily   guaiFENesin-dextromethorphan  10 mL Oral Q8H   insulin aspart  0-20 Units Subcutaneous TID WC   insulin aspart  2 Units Subcutaneous TID WC   insulin glargine-yfgn  30 Units Subcutaneous Daily   isosorbide mononitrate  60 mg Oral Daily   memantine  10 mg Oral Daily   memantine  5 mg Oral QHS   mometasone-formoterol  2 puff Inhalation BID   multivitamin  1 tablet Oral QHS   pantoprazole  20 mg Oral QAC  breakfast   polyethylene glycol  17 g Oral BID   senna-docusate  1 tablet Oral BID   sodium chloride flush  3 mL Intravenous Q12H   Continuous Infusions:  cefTRIAXone (ROCEPHIN)  IV 1 g (10/20/21 0924)      LOS: 6 days     SIGNED: Barton Dubois, MD, FHM.  [facility],  Pager (please use [email or web address] to page/text)  Please use Epic Secure Chat for non-urgent communication (7AM-7PM) If 7PM-7AM, please contact night-coverage [email or web address],  10/20/2021, 6:06 PM    10/20/2021, 6:06 PM

## 2021-10-20 NOTE — Progress Notes (Signed)
Physical Therapy Treatment Patient Details Name: [name] MRN: [id number] DOB: 10-Nov-1934 Today's Date: 10/20/2021   History of Present Illness Pt is an 87 y.o. female who presented 10/13/21 for SOB and hypoxia secondary to acute on chronic heart failure with preserved ejection fraction. PMH: chronic HFpEF (EF 55-60% by TTE 07/05/2021), CAD (BMS to RCA 2004, DESx2 to mid and proximal RCA 2012, recent NSTEMI 02/2020 s/p DES to RCA), COPD, CKD stage IIIa, IDDM, HTN, HLD, anemia of chronic disease, prior PE/DVT, left breast cancer, depression/anxiety, chronic pain, dementia    PT Comments    Pt declined to get up to chair but was agreeable to transfer to/from bedside commode for BM today. Pt with improved power up to stand and stability, only needing minA to stand and step to/from bedside commode while holding onto furniture and maintaining a flexed posture. Pt has poor safety awareness, deciding to crawl forward onto the bed rather than turn all the way around to sit on it as PT had tried to cue pt to do. Will continue to follow acutely. Current recommendations remain appropriate.     Recommendations for follow up therapy are one component of a multi-disciplinary discharge planning process, led by the attending physician.  Recommendations may be updated based on patient status, additional functional criteria and insurance authorization.  Follow Up Recommendations  Home health PT (if family can provide level of care needed)     Assistance Recommended at Discharge Intermittent Supervision/Assistance  Patient can return home with the following Assistance with cooking/housework;Assist for transportation;Help with stairs or ramp for entrance;A lot of help with walking and/or transfers;A lot of help with bathing/dressing/bathroom;Direct supervision/assist for medications management;Direct supervision/assist for financial management   Equipment Recommendations  Hospital bed;Other (comment)  (electric w/c)    Recommendations for Other Services       Precautions / Restrictions Precautions Precautions: Fall;Other (comment) Precaution Comments: watch SpO2 Restrictions Weight Bearing Restrictions: No     Mobility  Bed Mobility Overal bed mobility: Needs Assistance Bed Mobility: Supine to Sit, Sit to Supine     Supine to sit: Min guard, HOB elevated Sit to supine: Min assist   General bed mobility comments: Min guard and extra time for pt to use bed rails to sit up R EOB from elevated HOB. Pt crawling forward into bed almost into prone/R sidelying, needing minA to lift R leg into bed to achieve, pt using bed rails.    Transfers Overall transfer level: Needs assistance Equipment used:  (holds onto furniture) Transfers: Sit to/from Stand, Bed to chair/wheelchair/BSC Sit to Stand: Min assist   Step pivot transfers: Min assist       General transfer comment: Pt needing minA to come to stand from EOB 1x and commode 1x. Pt performing a stand step transfer to L bed > commode holding onto furniture for support, maintaining a flexed posture throughout, minA for stability. When returned to bed pt decided to lean forearms anteriorly on bed to allow PT to perform pericare then decided to crawl forward onto bed to return to supine despite PT cues to pivot to get buttocks on bed first.    Ambulation/Gait Ambulation/Gait assistance: Min assist Gait Distance (Feet): 2 Feet (x2 bouts ~1-2 ft each bout) Assistive device:  (holding onto furniture) Gait Pattern/deviations: Step-through pattern, Decreased stride length, Shuffle, Trunk flexed Gait velocity: reduced Gait velocity interpretation: <1.31 ft/sec, indicative of household ambulator   General Gait Details: Pt with flexed posture holding onto furniture to step between surfaces  Stairs             Wheelchair Mobility    Modified Rankin (Stroke Patients Only)       Balance Overall balance assessment: Needs  assistance Sitting-balance support: No upper extremity supported, Feet supported Sitting balance-Leahy Scale: Fair     Standing balance support: Bilateral upper extremity supported, During functional activity, Single extremity supported Standing balance-Leahy Scale: Poor Standing balance comment: Reliant on UE support and up to [employer] Arousal/Alertness: Awake/alert Behavior During Therapy: WFL for tasks assessed/performed                                   General Comments: Pt with noted memory deficits and safety awareness deficits, likely baseline        Exercises      General Comments        Pertinent Vitals/Pain Pain Assessment Pain Assessment: Faces Faces Pain Scale: Hurts little more Pain Location: knees Pain Descriptors / Indicators: Discomfort, Grimacing Pain Intervention(s): Limited activity within patient's tolerance, Monitored during session, Repositioned    Home Living                          Prior Function            PT Goals (current goals can now be found in the care plan section) Acute Rehab PT Goals Patient Stated Goal: to have a BM PT Goal Formulation: With patient/family Time For Goal Achievement: 10/29/21 Potential to Achieve Goals: Fair Progress towards PT goals: Progressing toward goals    Frequency    Min 3X/week      PT Plan Current plan remains appropriate    Co-evaluation              AM-PAC PT "6 Clicks" Mobility   Outcome Measure  Help needed turning from your back to your side while in a flat bed without using bedrails?: A Little Help needed moving from lying on your back to sitting on the side of a flat bed without using bedrails?: A Little Help needed moving to and from a bed to a chair (including a wheelchair)?: A Little Help needed standing up from a chair using your arms (e.g., wheelchair or bedside chair)?: A Little Help needed to walk in  hospital room?: Total Help needed climbing 3-5 steps with a railing? : Total 6 Click Score: 14    End of Session Equipment Utilized During Treatment: Gait belt Activity Tolerance: Patient limited by fatigue Patient left: with call bell/phone within reach;with family/visitor present;in bed;with bed alarm set Nurse Communication: Mobility status (NT) PT Visit Diagnosis: Unsteadiness on feet (R26.81);Other abnormalities of gait and mobility (R26.89);Muscle weakness (generalized) (M62.81);Difficulty in walking, not elsewhere classified (R26.2)     Time: 9798-9211 PT Time Calculation (min) (ACUTE ONLY): 32 min  Charges:  $Therapeutic Activity: 23-37 mins                     Moishe Spice, PT, DPT Acute Rehabilitation Services  Office: [phone]    Orvan Falconer 10/20/2021, 11:51 AM

## 2021-10-21 DIAGNOSIS — N1831 Chronic kidney disease, stage 3a: Secondary | ICD-10-CM | POA: Diagnosis not present

## 2021-10-21 DIAGNOSIS — G894 Chronic pain syndrome: Secondary | ICD-10-CM | POA: Diagnosis not present

## 2021-10-21 DIAGNOSIS — I5033 Acute on chronic diastolic (congestive) heart failure: Secondary | ICD-10-CM | POA: Diagnosis not present

## 2021-10-21 DIAGNOSIS — F418 Other specified anxiety disorders: Secondary | ICD-10-CM | POA: Diagnosis not present

## 2021-10-21 LAB — CBC
HCT: 28.7 % — ABNORMAL LOW (ref 36.0–46.0)
Hemoglobin: 9.1 g/dL — ABNORMAL LOW (ref 12.0–15.0)
MCH: 29.5 pg (ref 26.0–34.0)
MCHC: 31.7 g/dL (ref 30.0–36.0)
MCV: 93.2 fL (ref 80.0–100.0)
Platelets: 276 10*3/uL (ref 150–400)
RBC: 3.08 MIL/uL — ABNORMAL LOW (ref 3.87–5.11)
RDW: 13.8 % (ref 11.5–15.5)
WBC: 15.7 10*3/uL — ABNORMAL HIGH (ref 4.0–10.5)
nRBC: 0 % (ref 0.0–0.2)

## 2021-10-21 LAB — BASIC METABOLIC PANEL
Anion gap: 11 (ref 5–15)
BUN: 42 mg/dL — ABNORMAL HIGH (ref 8–23)
CO2: 23 mmol/L (ref 22–32)
Calcium: 8.9 mg/dL (ref 8.9–10.3)
Chloride: 100 mmol/L (ref 98–111)
Creatinine, Ser: 1.52 mg/dL — ABNORMAL HIGH (ref 0.44–1.00)
GFR, Estimated: 33 mL/min — ABNORMAL LOW (ref >60)
Glucose, Bld: 143 mg/dL — ABNORMAL HIGH (ref 70–99)
Potassium: 5.4 mmol/L — ABNORMAL HIGH (ref 3.5–5.1)
Sodium: 134 mmol/L — ABNORMAL LOW (ref 135–145)

## 2021-10-21 LAB — GLUCOSE, CAPILLARY
Glucose-Capillary: 170 mg/dL — ABNORMAL HIGH (ref 70–99)
Glucose-Capillary: 176 mg/dL — ABNORMAL HIGH (ref 70–99)
Glucose-Capillary: 258 mg/dL — ABNORMAL HIGH (ref 70–99)
Glucose-Capillary: 184 mg/dL — ABNORMAL HIGH (ref 70–99)

## 2021-10-21 MED ORDER — HYDROXYZINE HCL 10 MG PO TABS
10.0000 mg | ORAL_TABLET | Freq: Three times a day (TID) | ORAL | Status: DC | PRN
  Administered 2021-10-21 – 2021-10-22 (×2): 10 mg via ORAL
  Filled 2021-10-21 (×2): qty 1

## 2021-10-21 MED ORDER — SODIUM ZIRCONIUM CYCLOSILICATE 5 G PO PACK
5.0000 g | PACK | Freq: Every day | ORAL | Status: AC
Start: 2021-10-21 — End: 2021-10-21
  Administered 2021-10-21: 5 g via ORAL
  Filled 2021-10-21: qty 1

## 2021-10-21 MED ORDER — CEFDINIR 300 MG PO CAPS
300.0000 mg | ORAL_CAPSULE | Freq: Two times a day (BID) | ORAL | Status: DC
  Administered 2021-10-21 – 2021-10-22 (×2): 300 mg via ORAL
  Filled 2021-10-21 (×3): qty 1

## 2021-10-21 NOTE — Progress Notes (Signed)
Physical Therapy Treatment Patient Details Name: [name] MRN: [id number] DOB: 12/16/1934 Today's Date: 10/21/2021   History of Present Illness Pt is an 87 y.o. female who presented 10/13/21 for SOB and hypoxia secondary to acute on chronic heart failure with preserved ejection fraction. PMH: chronic HFpEF (EF 55-60% by TTE 07/05/2021), CAD (BMS to RCA 2004, DESx2 to mid and proximal RCA 2012, recent NSTEMI 02/2020 s/p DES to RCA), COPD, CKD stage IIIa, IDDM, HTN, HLD, anemia of chronic disease, prior PE/DVT, left breast cancer, depression/anxiety, chronic pain, dementia    PT Comments    Pt reports being sleepy and thus declined exercises or further mobility past transferring bed > recliner, but pt was able to come to stand and take small steps to transfer bed > recliner while bil upper extremities held onto furniture with minA for stability. Will continue to follow acutely. Current recommendations remain appropriate.     Recommendations for follow up therapy are one component of a multi-disciplinary discharge planning process, led by the attending physician.  Recommendations may be updated based on patient status, additional functional criteria and insurance authorization.  Follow Up Recommendations  Home health PT (if family can provide level of care needed)     Assistance Recommended at Discharge Intermittent Supervision/Assistance  Patient can return home with the following Assistance with cooking/housework;Assist for transportation;Help with stairs or ramp for entrance;A lot of help with walking and/or transfers;A lot of help with bathing/dressing/bathroom;Direct supervision/assist for medications management;Direct supervision/assist for financial management   Equipment Recommendations  Hospital bed;Other (comment) (electric w/c)    Recommendations for Other Services       Precautions / Restrictions Precautions Precautions: Fall;Other (comment) Precaution Comments: watch  SpO2 Restrictions Weight Bearing Restrictions: No     Mobility  Bed Mobility Overal bed mobility: Needs Assistance Bed Mobility: Supine to Sit     Supine to sit: Min guard, HOB elevated     General bed mobility comments: Min guard and extra time for pt to use bed rails to sit up R EOB from elevated HOB (42 degrees).    Transfers Overall transfer level: Needs assistance Equipment used:  (holds onto furniture) Transfers: Sit to/from Stand, Bed to chair/wheelchair/BSC Sit to Stand: Min assist   Step pivot transfers: Min assist       General transfer comment: Pt needing minA to come to stand from EOB 1x and recliner 1x. Pt performing a stand step transfer to L bed > recliner holding onto recliner on L and bedside commode with R UE for support, maintaining a flexed posture throughout, minA for stability.    Ambulation/Gait Ambulation/Gait assistance: Min assist Gait Distance (Feet): 2 Feet Assistive device:  (holding onto furniture) Gait Pattern/deviations: Step-through pattern, Decreased stride length, Shuffle, Trunk flexed Gait velocity: reduced Gait velocity interpretation: <1.31 ft/sec, indicative of household ambulator   General Gait Details: Pt with flexed posture holding onto furniture to step between surfaces, minA for stability   Stairs             Wheelchair Mobility    Modified Rankin (Stroke Patients Only)       Balance Overall balance assessment: Needs assistance Sitting-balance support: No upper extremity supported, Feet supported Sitting balance-Leahy Scale: Fair     Standing balance support: Bilateral upper extremity supported, During functional activity, Single extremity supported Standing balance-Leahy Scale: Poor Standing balance comment: Reliant on UE support and up to [employer]  Cognition Arousal/Alertness: Awake/alert Behavior During Therapy: WFL for tasks assessed/performed                                    General Comments: Pt with noted memory deficits and safety awareness deficits, likely baseline        Exercises      General Comments General comments (skin integrity, edema, etc.): VSS on 2L O2      Pertinent Vitals/Pain Pain Assessment Pain Assessment: Faces Faces Pain Scale: Hurts little more Pain Location: knees Pain Descriptors / Indicators: Discomfort, Grimacing Pain Intervention(s): Limited activity within patient's tolerance, Monitored during session, Repositioned    Home Living                          Prior Function            PT Goals (current goals can now be found in the care plan section) Acute Rehab PT Goals Patient Stated Goal: to get to chair PT Goal Formulation: With patient Time For Goal Achievement: 10/29/21 Potential to Achieve Goals: Fair Progress towards PT goals: Progressing toward goals    Frequency    Min 3X/week      PT Plan Current plan remains appropriate    Co-evaluation              AM-PAC PT "6 Clicks" Mobility   Outcome Measure  Help needed turning from your back to your side while in a flat bed without using bedrails?: A Little Help needed moving from lying on your back to sitting on the side of a flat bed without using bedrails?: A Little Help needed moving to and from a bed to a chair (including a wheelchair)?: A Little Help needed standing up from a chair using your arms (e.g., wheelchair or bedside chair)?: A Little Help needed to walk in hospital room?: Total Help needed climbing 3-5 steps with a railing? : Total 6 Click Score: 14    End of Session Equipment Utilized During Treatment: Gait belt Activity Tolerance: Patient limited by fatigue Patient left: with call bell/phone within reach;in chair;with chair alarm set Nurse Communication: Mobility status PT Visit Diagnosis: Unsteadiness on feet (R26.81);Other abnormalities of gait and mobility (R26.89);Muscle weakness  (generalized) (M62.81);Difficulty in walking, not elsewhere classified (R26.2)     Time: 2248-2500 PT Time Calculation (min) (ACUTE ONLY): 20 min  Charges:  $Therapeutic Activity: 8-22 mins                     Moishe Spice, PT, DPT Acute Rehabilitation Services  Office: [phone]    Orvan Falconer 10/21/2021, 8:41 AM

## 2021-10-21 NOTE — TOC Initial Note (Addendum)
Transition of Care [facility]) - Initial/Assessment Note    Patient Details  Name: [name] MRN: [id number] Date of Birth: 07/23/1934  Transition of Care [facility]) CM/SW Contact:    Nichole Ingles, RN Phone Number:[phone]  10/21/2021, 8:55 AM  Clinical Narrative:                 TOC following for patient with high risk for readmission, DME needs and HH recommendations. CM at bedside with patient and daughter. Patient is from home with adult children who care for her. Patient states that she manages at home with family supports. Patient states that she does have PCP per records [name], Nichole American, NP and is active with her care. CM offered choice for home health services per PT recommendation. Patient has no preference as long as insurance will cover, patient inquired if insurance would pay for a cna to be in the home. CM has made patient aware that insurance will not pay and that would be a private pay issue. Patient verbalized understanding. Patient states that she does have access to medications and transportation to appointments as necessary.  Home health referral has been accepted by Amy with [facility]. ANS has been updated. CM spoke with daughter [name]  about PT recommendation for hospital bed. Daughter states that they do not want the hospital bed unless its like the one in the hospital because the other ones are old and not helpful in providing care for the patient. CM called DME company to inquire about bed type. Patient can receive a standard manuel bed. Patient can not receive bed like current hospital bed. No bed has been ordered at this time. Currently there are no other needs. TOC will continue to follow.   Expected Discharge Plan: [place] Barriers to Discharge: Continued Medical Work up   Patient Goals and CMS Choice Patient states their goals for this hospitalization and ongoing recovery are:: Wants to get better to go home CMS Medicare.gov Compare Post  Acute Care list provided to:: Patient Choice offered to / list presented to : Patient  Expected Discharge Plan and Services Expected Discharge Plan: [place] In-house Referral: NA Discharge Planning Services: CM Consult Post Acute Care Choice: Home Health                   DME Arranged: N/A DME Agency: NA                  Prior Living Arrangements/Services   Lives with:: Adult Children Patient language and need for interpreter reviewed:: Yes Do you feel safe going back to the place where you live?: Yes      Need for Family Participation in Patient Care: Yes (Comment) Care giver support system in place?: Yes (comment) Current home services: DME (walker, shower bench, wheelchair) Criminal Activity/Legal Involvement Pertinent to Current Situation/Hospitalization: No - Comment as needed  Activities of Daily Living Home Assistive Devices/Equipment: Wheelchair ADL Screening (condition at time of admission) Patient's cognitive ability adequate to safely complete daily activities?: Yes Is the patient deaf or have difficulty hearing?: No Does the patient have difficulty seeing, even when wearing glasses/contacts?: No Does the patient have difficulty concentrating, remembering, or making decisions?: No Patient able to express need for assistance with ADLs?: Yes Does the patient have difficulty dressing or bathing?: Yes Independently performs ADLs?: No Communication: Needs assistance Is this a change from baseline?: Pre-admission baseline Dressing (OT): Needs assistance Is this a change  from baseline?: Pre-admission baseline Grooming: Needs assistance Is this a change from baseline?: Pre-admission baseline Feeding: Independent Bathing: Needs assistance Is this a change from baseline?: Pre-admission baseline Toileting: Needs assistance Is this a change from baseline?: Pre-admission baseline In/Out Bed: Needs assistance Is this a change from baseline?:  Pre-admission baseline Walks in Home: Needs assistance Is this a change from baseline?: Pre-admission baseline Does the patient have difficulty walking or climbing stairs?: Yes Weakness of Legs: Both Weakness of Arms/Hands: Both  Permission Sought/Granted Permission sought to share information with : Family Supports Permission granted to share information with : No              Emotional Assessment Appearance:: Appears stated age Attitude/Demeanor/Rapport: Gracious, Engaged Affect (typically observed): Accepting, Pleasant Orientation: : Oriented to Self, Oriented to Place, Oriented to  Time, Oriented to Situation Alcohol / Substance Use: Not Applicable Psych Involvement: No (comment)  Admission diagnosis:  Acute on chronic congestive heart failure, unspecified heart failure type ([place]) [I50.9] Acute on chronic heart failure with preserved ejection fraction (HFpEF) (HCC) [I50.33] Acute on chronic heart failure (HCC) [I50.9] Patient Active Problem List   Diagnosis Date Noted   Acute on chronic heart failure ([place]) 10/14/2021   Acute on chronic heart failure with preserved ejection fraction (HFpEF) ([place]) 10/13/2021   Syncope, vasovagal 07/04/2021   Syncope 07/04/2021   Right elbow pain 04/27/2021   Chronic pain of right hand 04/27/2021   Iron deficiency anemia 09/13/2020   Bilateral primary osteoarthritis of knee 04/12/2020   Pain management contract signed 04/12/2020   Chronic radicular lumbar pain 03/18/2020   Lumbar spondylosis 03/18/2020   Spinal stenosis, lumbar region, with neurogenic claudication 03/18/2020   Presence of stent in right coronary artery    Chest pain on exertion 02/14/2020   Moderate nonproliferative diabetic retinopathy of both eyes without macular edema associated with type 2 diabetes mellitus ([place]) 08/27/2019   Type 2 diabetes mellitus with hyperglycemia, with long-term current use of insulin ([place]) 03/25/2019   Type 2 diabetes mellitus with diabetic  neuropathy, with long-term current use of insulin ([place]) 12/13/2017   Hyperkalemia 07/26/2017   Anxiety associated with depression 06/08/2017   GERD with esophagitis 11/15/2016   Memory loss 11/15/2016   Multiple benign nevi 11/15/2016   Esophageal dysphagia 11/07/2016   Severe episode of recurrent major depressive disorder, without psychotic features ([place]) 06/23/2016   Endometrial polyp 06/23/2016   Vaginitis and vulvovaginitis 06/23/2016   Cough variant asthma 11/17/2015   Vitamin D deficiency 08/02/2015   Breast cancer ([place]) 04/26/2015   Dyslipidemia associated with type 2 diabetes mellitus ([place]) 02/06/2015   Type 2 diabetes mellitus with stage 3b chronic kidney disease, with long-term current use of insulin ([place]) 02/06/2015   Chronic pain syndrome 02/06/2015   Morbid obesity with BMI of 50.0-59.9, adult ([place]) 12/18/2014   Chronic kidney disease, stage 3a ([place]) 12/18/2014   Gout 10/27/2014   Breast mass 10/27/2014   Encounter for therapeutic drug monitoring 10/08/2014   Vertigo 09/24/2014   Chronic bronchitis ([place]) 09/23/2014   DOE (dyspnea on exertion) 06/24/2014   CN (constipation) 05/10/2014   History of pulmonary embolus (PE) 04/22/2014   COPD with chronic bronchitis ([place]) 04/21/2014   Estrogen deficiency 02/19/2014   Breast cancer screening 02/19/2014   Weight gain 02/19/2014   Pain in the chest 12/31/2013   Type 2 diabetes mellitus with diabetic foot deformity ([place]) 07/29/2013   OSA (obstructive sleep apnea) 02/24/2013   Need for prophylactic vaccination and inoculation against influenza 01/08/2013  Insomnia 09/17/2012   Debility 08/01/2012   Osteoarthritis 08/01/2012   Dysphagia, unspecified(787.20) 07/31/2012   GERD (gastroesophageal reflux disease) 07/31/2012   Long term current use of anticoagulant therapy 02/02/2012   Chronic diastolic congestive heart failure ([place]) 01/29/2012   History of deep venous thrombosis 01/27/2012   Abdominal pain 01/26/2012    Situational depression 06/04/2009   ORTHOPNEA 03/09/2009   Morbid (severe) obesity due to excess calories ([place]) 02/15/2009   Microcytic anemia 02/15/2009   TENSION HEADACHE 01/07/2008   Headache 01/07/2008   Fungal dermatitis 11/13/2007   URINARY INCONTINENCE 08/29/2007   Coronary artery disease of native artery of native heart with stable angina pectoris ([place])    Hyperlipidemia 12/03/2006   Essential hypertension 12/03/2006   Personal history of other diseases of the digestive system 12/03/2006   PCP:  [name], NP Pharmacy:   [facility] [address] [place] - [place] [address] Phone: [phone] Fax: [phone]  [facility] - [place], [place] - [address] [place] [address] Phone: [phone] Fax: [place] [address] [place] [address] Phone: [phone] Fax: [phone]     Social Determinants of Health (SDOH) Interventions Food Insecurity Interventions: Intervention Not Indicated Financial Strain Interventions: Intervention Not Indicated Housing Interventions: Intervention Not Indicated Transportation Interventions: Intervention Not Indicated  Readmission Risk Interventions    10/20/2021    3:50 PM 02/18/2020   11:52 AM 01/27/2019   12:07 PM  Readmission Risk Prevention Plan  Transportation Screening Complete Complete Complete  Medication Review Press photographer) Complete Complete Complete  PCP or Specialist appointment within 3-5 days of discharge Complete Complete Complete  HRI or Home Care Consult Complete Complete Complete  SW Recovery Care/Counseling Consult Complete Complete Complete  Palliative Care Screening Not Applicable Not Applicable Not [place] Not Applicable Not Applicable Not Applicable

## 2021-10-21 NOTE — Progress Notes (Signed)
Heart Failure Stewardship Pharmacist Progress Note   PCP: [name], NP PCP-Cardiologist: Ena Dawley, MD    HPI:  [name] with PMH of T2DM, COPD, CAD s/p DES to prox and mid RCA, HF, PE off anticoagulation with worsening anemia, breast cancer, and morbid obesity. She presented to the ED on 6/8 with abdominal pain, headache, shortness of breath, and orthopnea. CXR with vascular congestion and basilar atelectasis. Her last ECHO was done 2/28 and LVEF was 55-60% with G1DD.  Current HF Medications: Diuretic: furosemide 40 mg PO daily Beta Blocker: carvedilol 12.5 mg BID Other: Imdur 60 mg daily  Prior to admission HF Medications: Diuretic: furosemide 20 mg daily PRN Beta blocker: carvedilol 12.5 mg BID Other: Imdur 60 mg daily  Pertinent Lab Values: Serum creatinine 1.52, BUN 42, Potassium 5.4 s/p Lokelma and Kayexalate, Sodium 134, BNP 116, Magnesium 1.7, A1c 7( 07/04/21)   Vital Signs: Weight: 315 lbs (admission weight: 311 lbs) Blood pressure: 120-140/50s  Heart rate: 70-80s  I/O: -721m yesterday, net -3L   Medication Assistance / Insurance Benefits Check: Does the patient have prescription insurance?  Yes Type of insurance plan: [employer]  Does the patient qualify for medication assistance through manufacturers or grants?   Pending Eligible grants and/or patient assistance programs: pending Medication assistance applications in progress: none  Medication assistance applications approved: none Approved medication assistance renewals will be completed by: pending  Outpatient Pharmacy:  Prior to admission [facility]? Yes Is the patient willing to [facility]   Pending    Assessment: 1. Acute on chronic diastolic CHF (LVEF 533-54%. NYHA class III symptoms. - Continue furosemide 40 mg PO daily. Keep K>4  and mag>2. K is elevated 5.4 after one dose of Lokelma and one dose of Kayexalate. Repeating [facility]. - Continue carvedilol 12.5 mg BID - No SGLT2i with current treatment for ecoli UTI, also wheelchair bound and AKI - Continue Imdur 60 mg daily   Plan: 1) Medication changes recommended at this time: - Agree with above  2) Patient assistance: - Appears she is in the donut hole - [place] currently are >$100 for Jardiance and Farxiga  - Will look into patient assistance options for her if these are added  3)  Education  - Patient has been educated on current HF medications and potential additions to HF medication regimen - Patient verbalizes understanding that over the next few months, these medication doses may change and more medications may be added to optimize HF regimen - Patient has been educated on basic disease state pathophysiology and goals of therapy   MKerby Nora PharmD, BCPS Heart Failure Stewardship Pharmacist Phone ([phone]

## 2021-10-21 NOTE — Progress Notes (Signed)
Complaint of feeling anxious because  she is not going home today as per daughter. MD made aware with order.

## 2021-10-21 NOTE — Plan of Care (Signed)
  Problem: Education: Goal: Ability to describe self-care measures that may prevent or decrease complications (Diabetes Survival Skills Education) will improve Outcome: Progressing   Problem: Coping: Goal: Ability to adjust to condition or change in health will improve Outcome: Progressing   Problem: Fluid Volume: Goal: Ability to maintain a balanced intake and output will improve Outcome: Progressing   Problem: Cardiac: Goal: Ability to achieve and maintain adequate cardiopulmonary perfusion will improve Outcome: Progressing

## 2021-10-21 NOTE — Plan of Care (Signed)
  Problem: Education: Goal: Ability to verbalize understanding of medication therapies will improve Outcome: Progressing   Problem: Activity: Goal: Capacity to carry out activities will improve Outcome: Progressing   Problem: Cardiac: Goal: Ability to achieve and maintain adequate cardiopulmonary perfusion will improve Outcome: Progressing   Problem: Education: Goal: Ability to demonstrate management of disease process will improve Outcome: Progressing Goal: Ability to verbalize understanding of medication therapies will improve Outcome: Progressing

## 2021-10-21 NOTE — Progress Notes (Signed)
PROGRESS NOTE    [name]   SWN:[id number]  DOB: 1935/04/21  DOA: 10/13/2021   PCP: [name], NP   Subjective: The patient was seen and examined this morning, stable laying in bed in no complaint with exception of cough mild shortness of breath  Still requiring 2-1 L of oxygen satting 97%   [name] is a 86/F chronically ill morbidly obese female with history of chronic diastolic CHF, CAD,  NSTEMI 02/2020 s/p DES to RCA), COPD, CKD stage IIIa, IDDM, HTN, HLD, anemia of chronic disease, prior PE/DVT, left breast cancer, depression/anxiety, chronic pain, memory deficits who presented to the ED for evaluation of shortness of breath.  She is debilitated and wheelchair-bound at baseline, spends most of her time in bed, able to transfer to bedside commode for BMs. -Presented to the ED 6/8 with worsening dyspnea on exertion, orthopnea and cough. She is on Lasix 20 Mg PRN at baseline, unsure if she was taking this as her daughter dispenses her medications. in the ED labs noted creatinine of 1.1, BNP 116, WBC of 16 K, chest x-ray noted low lung volumes and pulmonary vascular congestion with basilar atelectasis. -Improving on diuretics and antibiotics for bronchitis/early PNA and UTI. -today with hyperkalemia; but reporting improvement in her breathing. -Hyperkalemia improved but is still present; provide treatment with insulin and repeat Lokelma.  Follow electrolytes in a.m.  Hopefully home on 10/22/2021.    Assessment and Plan:  Acute on chronic diastolic CHF  -At baseline on Lasix 20 Mg PRN only -Recent TTE 07/05/2021 showed EF 55-60%, G1DD. -intake/output not accurate but overall approx 5.3 L since admission. -continue current dose of oral diuretics. -She is not a good SGLT2 candidate with morbid obesity and bedbound status -most recent Repeat chest x-ray with worsening atelectasis, continue ABX for 5 days total. -continue the use of flutter valve/IS -Continue to  follow daily weights and strict intake/output. -Overall stable.  Bronchitis, early pneumonia Leukocytosis -Afebrile, has a productive cough, white count up to 16 K>>> 14.6; and trending down now. - chest x-ray with worsening atelectasis, continue IV ceftriaxone day 2, DuoNebs for pulmonary toilet -Urine culture growing E. coli as well, had some dysuria as well, follow-up sensitivities, current antibiotics will cover both. -Oral cefdinir following sensitivity has been started to complete antibiotic therapy. -continue the use of flutter valve and IS. -Repeat chest x-ray at in 6 weeks to assure complete resolution of infiltrates.  E. coli UTI -continue current antibiotics. -maintain adequate oral hydration. -Antibiotic therapy transition to cefdinir following sensitivity.  Morbid obesity Debility, wheelchair-bound status -Poor prognostic factors, PT eval completed, home health PT recommended; in place. -Body mass index is 49.34 kg/m. -low calorie diet and portion control discussed with patient.  Coronary artery disease of native artery of native heart with stable angina pectoris (HCC) -stable - on:  aspirin, Plavix, atorvastatin, Imdur. -No chest pain.  COPD with chronic bronchitis ([place]) -No further expiratory wheezes, see discussion above, starting ABX, continue DuoNebs, albuterol -Currently requiring 1-2 L of oxygen, satting 97%  -continue weaning off oxygen -as mentioned above will continue flutter valve and IS.  Type 2 diabetes mellitus with diabetic neuropathy, with long-term current use of insulin (HCC) -CBGs are stable, continue current dose of glargine -continue SSI and meal coverage. -Continue to follow CBGs fluctuation with further adjustment to hypoglycemia regimen as needed.  Essential hypertension -Continue home Coreg, Imdur. -heart healthy diet discussed with patient. -Continue adjusted dose of oral Lasix.  AKI on CKD  3a ([place]) -Baseline creatinine around 1.6,  1.7 -Creatinine 2.3 after acute diuresis -down to 1.6-1.7 -Continue oral Lasix at current dose. -continue to follow renal function trend.  Hyperkalemia -Overnight repeat potassium still elevated patient received 1 dose of Kayexalate, insulin and also another dose of calcium gluconate. -Telemetry evaluation demonstrated no significant cardiac abnormalities -Potassium continues to be elevated Repeat Lokelma dose -continue SSI -follow K trend in AM. -continue telemetry.  Anxiety associated with depression -Continue home Cymbalta. -As needed Atarax has been ordered.  Chronic pain syndrome -Continue Percocet q12h as needed. -Pain well controlled controlled.  Dyslipidemia associated with type 2 diabetes mellitus ([place]) -Continue atorvastatin. -Heart healthy diet discussed with patient.  Constipation -Continue laxatives and current bowel regimen. -Denies abdominal pain -Patient is having bowel movements.  Mild memory deficits -Continue Namenda she is AAOx3 -continue constant reorientation. -No hallucinations; following commands appropriately.   DVT prophylaxis: Lovenox Code Status: DNR Family Communication: Discussed patient in detail, daughter at bedside. Disposition Plan: Home in the next 24 hours is potassium stable.  Consultants:  None   Procedures: see below for x-ray reports.  Antimicrobials: Rocephin>>> cefdinir   Objective: Vitals:   10/21/21 0738 10/21/21 0808 10/21/21 1120 10/21/21 1521  BP: (!) 138/59  130/63 116/79  Pulse: 86  92 86  Resp: 17  (!) 22 15  Temp: 97.7 F (36.5 C)  98 F (36.7 C) 98.2 F (36.8 C)  TempSrc: Oral  Oral Oral  SpO2: 99% 97% 100% 98%  Weight:      Height:        Intake/Output Summary (Last 24 hours) at 10/21/2021 1556 Last data filed at 10/21/2021 0946 Gross per 24 hour  Intake 477 ml  Output 1100 ml  Net -623 ml   Filed Weights   10/19/21 0500 10/20/21 0413 10/21/21 0443  Weight: (!) 146.2 kg (!) 146.3 kg (!)  142.9 kg     Physical Exam: General exam: Alert, awake, oriented x 3; no chest pain, no nausea, no vomiting.  Reports feeling better and breathing easier as long as she has oxygen supplementation in place.  2 L nasal cannula supplementation appreciated at time of examination. Respiratory system: No using accessory muscles; decreased breath sounds at the bases. Positive rhonchi. Cardiovascular system:Rate controlled, no rubs, no gallops, unable to properly assess JVD due to body habitus. Gastrointestinal system: Abdomen is obese, nondistended, soft and nontender. No organomegaly or masses felt. Normal bowel sounds heard. Central nervous system: No focal neurological deficits. Extremities: No cyanosis or clubbing. Skin: No petechiae. Psychiatry: Judgement and insight appear normal. Anxious.   Data Reviewed:   CBC: Recent Labs  Lab 10/17/21 0115 10/18/21 0320 10/19/21 0308 10/20/21 0047 10/21/21 0211  WBC 14.2* 13.8* 14.9* 14.7* 15.7*  HGB 8.5* 8.9* 8.4* 8.9* 9.1*  HCT 27.4* 28.3* 26.6* 28.6* 28.7*  MCV 95.5 93.7 94.3 94.7 93.2  PLT 220 211 240 241 093   Basic Metabolic Panel: Recent Labs  Lab 10/16/21 0129 10/17/21 0115 10/19/21 0308 10/19/21 1231 10/20/21 0047 10/20/21 1803 10/21/21 0211  NA 133*   < > 133* 132* 133* 134* 134*  K 4.6   < > 5.8* 5.2* 6.2* 6.3* 5.4*  CL 100   < > 100 100 100 99 100  CO2 26   < > '24 22 22 24 23  '$ GLUCOSE 200*   < > 212* 246* 145* 283* 143*  BUN 35*   < > 49* 47* 48* 46* 42*  CREATININE 2.30*   < > 1.71*  1.75* 1.73* 1.69* 1.52*  CALCIUM 8.2*   < > 8.5* 8.7* 8.6* 9.2 8.9  MG 1.7  --   --   --   --   --   --    < > = values in this interval not displayed.   GFR: Estimated Creatinine Clearance: 40.2 mL/min (A) (by C-G formula based on SCr of 1.52 mg/dL (H)).   CBG: Recent Labs  Lab 10/20/21 1119 10/20/21 1625 10/20/21 2116 10/21/21 0632 10/21/21 1121  GLUCAP 215* 234* 196* 170* 176*   s:    Component Value Date/Time    COLORURINE YELLOW 10/13/2021 1908   APPEARANCEUR HAZY (A) 10/13/2021 1908   LABSPEC 1.016 10/13/2021 1908   PHURINE 5.0 10/13/2021 1908   GLUCOSEU NEGATIVE 10/13/2021 1908   HGBUR NEGATIVE 10/13/2021 1908   BILIRUBINUR NEGATIVE 10/13/2021 1908   BILIRUBINUR neg 06/03/2019 1116   KETONESUR NEGATIVE 10/13/2021 1908   PROTEINUR 100 (A) 10/13/2021 1908   UROBILINOGEN 0.2 06/03/2019 1116   UROBILINOGEN 0.2 02/16/2018 1514   NITRITE NEGATIVE 10/13/2021 1908   LEUKOCYTESUR SMALL (A) 10/13/2021 1908   Sepsis Labs:  Recent Results (from the past 240 hour(s))  MRSA Next Gen by PCR, Nasal     Status: None   Collection Time: 10/13/21 10:48 PM   Specimen: Nasal Mucosa; Nasal Swab  Result Value Ref Range Status   MRSA by PCR Next Gen NOT DETECTED NOT DETECTED Final    Comment: (NOTE) The GeneXpert MRSA Assay (FDA approved for NASAL specimens only), is one component of a comprehensive MRSA colonization surveillance program. It is not intended to diagnose MRSA infection nor to guide or monitor treatment for MRSA infections. Test performance is not FDA approved in patients less than 35 years old. Performed at [facility], [place] [address], [place], [place] [address]   Urine Culture     Status: Abnormal   Collection Time: 10/15/21  7:57 AM   Specimen: Urine, Clean Catch  Result Value Ref Range Status   Specimen Description URINE, CLEAN CATCH  Final   Special Requests   Final    NONE Performed at [facility], [place] [address] [place], [place] [address]    Culture >=100,000 COLONIES/mL ESCHERICHIA COLI (A)  Final   Report Status 10/17/2021 FINAL  Final   Organism ID, Bacteria ESCHERICHIA COLI (A)  Final      Susceptibility   Escherichia coli - MIC*    AMPICILLIN <=2 SENSITIVE Sensitive     CEFAZOLIN <=4 SENSITIVE Sensitive     CEFEPIME <=0.12 SENSITIVE Sensitive     CEFTRIAXONE <=0.25 SENSITIVE Sensitive     CIPROFLOXACIN >=4 RESISTANT Resistant     GENTAMICIN <=1 SENSITIVE  Sensitive     IMIPENEM <=0.25 SENSITIVE Sensitive     NITROFURANTOIN <=16 SENSITIVE Sensitive     TRIMETH/SULFA >=320 RESISTANT Resistant     AMPICILLIN/SULBACTAM <=2 SENSITIVE Sensitive     PIP/TAZO <=4 SENSITIVE Sensitive     * >=100,000 COLONIES/mL ESCHERICHIA COLI     Radiology Studies: No results found.  Scheduled Meds:  anastrozole  1 mg Oral Daily   aspirin EC  81 mg Oral Daily   atorvastatin  80 mg Oral QHS   carvedilol  12.5 mg Oral BID   cefdinir  300 mg Oral Q12H   clopidogrel  75 mg Oral Daily   DULoxetine  60 mg Oral Daily   enoxaparin (LOVENOX) injection  70 mg Subcutaneous Q24H   febuxostat  40 mg Oral Daily  feeding supplement (GLUCERNA SHAKE)  237 mL Oral TID BM   furosemide  40 mg Oral Daily   guaiFENesin-dextromethorphan  10 mL Oral Q8H   insulin aspart  0-20 Units Subcutaneous TID WC   insulin aspart  2 Units Subcutaneous TID WC   insulin glargine-yfgn  30 Units Subcutaneous Daily   isosorbide mononitrate  60 mg Oral Daily   memantine  10 mg Oral Daily   memantine  5 mg Oral QHS   mometasone-formoterol  2 puff Inhalation BID   multivitamin  1 tablet Oral QHS   pantoprazole  20 mg Oral QAC breakfast   polyethylene glycol  17 g Oral BID   senna-docusate  1 tablet Oral BID   sodium chloride flush  3 mL Intravenous Q12H   Continuous Infusions:    LOS: 7 days     SIGNED: Barton Dubois, MD [facility],  Pager (please use [email or web address] to page/text)  Please use Epic Secure Chat for non-urgent communication (7AM-7PM) If 7PM-7AM, please contact night-coverage [email or web address],  10/21/2021, 3:56 PM    10/21/2021, 3:56 PM

## 2021-10-21 NOTE — Progress Notes (Signed)
[facility] night cross cover note:  I was notified by RN of critical serum potassium level of 6.3 per BMP drawn at 1800 on 10/20/2021.   Per my chart review, this updated serum potassium level is similar to most recent prior value of 6.2 when checked on the morning of 10/20/2021, with interval management that included Lokelma 5 g p.o. x 1 dose at 1230 PM.   1800 BMP also notable for bicarbonate 24, anion gap 11, creatinine 1.69, improving from prior value of 1.73 in the setting of AKI, along with glucose 283.  Of note, 1800 BMP sample was reported to not be associated with any overt evidence of hemolysis, and as this updated serum potassium level was consistent with that noted at the time of a.m. labs on 10/20/21, I refrained from immediately repeating BMP to validate this result, as it appears to be legitimate/reproducible.   In the setting of initial presentation for acutely decompensated heart failure requiring IV diuresis, I refrained from aggressive administration of IV fluids as component of management of patient's hyperkalemia.  Additionally, in the setting of normal-appearing bicarbonate, refrained from intravenous administration of sodium bicarb.  Subsequently, I ordered Kayexalate 30 g p.o. x1 dose, NovoLog 10 units IV x1 with 1 amp of D50 and ordered repeat CBG at 2200, which ultimately demonstrated blood sugar of 196, with additional consideration for calcium gluconate.  Also ordered EKG along with repeat BMP to be checked at midnight on 10/21/2021, with the latter subsequently showing interval improvement in serum potassium level from 6.3 to 5.4, along with further improvement in patient's renal function.     Babs Bertin, DO Hospitalist

## 2021-10-22 DIAGNOSIS — N179 Acute kidney failure, unspecified: Secondary | ICD-10-CM

## 2021-10-22 DIAGNOSIS — I25118 Atherosclerotic heart disease of native coronary artery with other forms of angina pectoris: Secondary | ICD-10-CM | POA: Diagnosis not present

## 2021-10-22 DIAGNOSIS — I5033 Acute on chronic diastolic (congestive) heart failure: Secondary | ICD-10-CM | POA: Diagnosis not present

## 2021-10-22 DIAGNOSIS — E1169 Type 2 diabetes mellitus with other specified complication: Secondary | ICD-10-CM | POA: Diagnosis not present

## 2021-10-22 DIAGNOSIS — N189 Chronic kidney disease, unspecified: Secondary | ICD-10-CM

## 2021-10-22 DIAGNOSIS — C50919 Malignant neoplasm of unspecified site of unspecified female breast: Secondary | ICD-10-CM

## 2021-10-22 DIAGNOSIS — J449 Chronic obstructive pulmonary disease, unspecified: Secondary | ICD-10-CM | POA: Diagnosis not present

## 2021-10-22 LAB — BASIC METABOLIC PANEL
Anion gap: 8 (ref 5–15)
BUN: 38 mg/dL — ABNORMAL HIGH (ref 8–23)
CO2: 27 mmol/L (ref 22–32)
Calcium: 8.4 mg/dL — ABNORMAL LOW (ref 8.9–10.3)
Chloride: 98 mmol/L (ref 98–111)
Creatinine, Ser: 1.53 mg/dL — ABNORMAL HIGH (ref 0.44–1.00)
GFR, Estimated: 33 mL/min — ABNORMAL LOW (ref >60)
Glucose, Bld: 129 mg/dL — ABNORMAL HIGH (ref 70–99)
Potassium: 4.7 mmol/L (ref 3.5–5.1)
Sodium: 133 mmol/L — ABNORMAL LOW (ref 135–145)

## 2021-10-22 LAB — CBC
HCT: 27.9 % — ABNORMAL LOW (ref 36.0–46.0)
Hemoglobin: 8.6 g/dL — ABNORMAL LOW (ref 12.0–15.0)
MCH: 29.4 pg (ref 26.0–34.0)
MCHC: 30.8 g/dL (ref 30.0–36.0)
MCV: 95.2 fL (ref 80.0–100.0)
Platelets: 279 10*3/uL (ref 150–400)
RBC: 2.93 MIL/uL — ABNORMAL LOW (ref 3.87–5.11)
RDW: 14 % (ref 11.5–15.5)
WBC: 14 10*3/uL — ABNORMAL HIGH (ref 4.0–10.5)
nRBC: 0 % (ref 0.0–0.2)

## 2021-10-22 LAB — GLUCOSE, CAPILLARY: Glucose-Capillary: 135 mg/dL — ABNORMAL HIGH (ref 70–99)

## 2021-10-22 MED ORDER — CEFDINIR 300 MG PO CAPS
300.0000 mg | ORAL_CAPSULE | Freq: Two times a day (BID) | ORAL | 0 refills | Status: AC
Start: 2021-10-22 — End: 2021-10-27

## 2021-10-22 MED ORDER — GLUCERNA SHAKE PO LIQD: 237.0000 mL | Freq: Three times a day (TID) | ORAL | 0 refills | Status: DC

## 2021-10-22 MED ORDER — FUROSEMIDE 40 MG PO TABS: 40.0000 mg | ORAL_TABLET | Freq: Every day | ORAL | 0 refills | Status: DC

## 2021-10-22 MED ORDER — HYDROXYZINE HCL 10 MG PO TABS: 10.0000 mg | ORAL_TABLET | Freq: Three times a day (TID) | ORAL | 0 refills | Status: DC | PRN

## 2021-10-22 NOTE — Assessment & Plan Note (Signed)
Calculated BMI 49,4 Patient is wheelchair bound at home. Continue with home PT

## 2021-10-22 NOTE — Assessment & Plan Note (Signed)
09/2021 outpatient oncology follow up. Plan to continue with anastroazole.  Iron deficiency anemia, getting IV iron infusions.

## 2021-10-22 NOTE — Discharge Summary (Addendum)
Physician Discharge Summary   Patient: [name] MRN: [id number] DOB: 03/23/1935  Admit date:     10/13/2021  Discharge date: 10/22/21  Discharge Physician: Jimmy Picket Luciano Cinquemani   PCP: [name], NP   Recommendations at discharge:    Patient will continue taking furosemide 40 mg daily. Follow up renal function and electrolytes in 7 days.  Continue with omnicef for 5 days.  Added hydroxyzine as needed for anxiety.   I spoke over the phone with the patient's daughter about patient's  condition, plan of care, prognosis and all questions were addressed.   Discharge Diagnoses: Principal Problem:   Acute on chronic heart failure with preserved ejection fraction (HFpEF) (HCC) Active Problems:   Coronary artery disease of native artery of native heart with stable angina pectoris (HCC)   COPD with chronic bronchitis (HCC)   Type 2 diabetes mellitus with hyperlipidemia (HCC)   Essential hypertension   Acute kidney injury superimposed on chronic kidney disease (HCC)   Anxiety associated with depression   Chronic pain syndrome   Class 3 obesity (HCC)   Breast cancer (HCC)  Resolved Problems:   * No resolved hospital problems. *  Hospital Course: [name] is a 87 y.o. female with medical history significant for chronic HFpEF (EF 55-60% by TTE 07/05/2021), CAD (BMS to RCA 2004, DESx2 to mid and proximal RCA 2012, recent NSTEMI 02/2020 s/p DES to RCA), COPD, CKD stage IIIa, IDDM, HTN, HLD, anemia of chronic disease, prior PE/DVT, left breast cancer, depression/anxiety, chronic pain, dementia who is admitted with acute on chronic HFpEF.  Patient is mostly wheelchair bound at home. Reported 2 weeks of worsening dyspnea on exertion and orthopnea, along with lower extremity edema. On her initial physical examination her blood pressure was 144/82, HR 85, rr 18 and 02 saturation 98%, lungs with distant breath sounds, heart with S1 and S2 present and regular, abdomen not  distended and positive lower extremity edema.   Na 141, K 4,5 cl 108, bicarbonate at 26, glucose 198 bun 18 cr 1,13 BMP 116 Wbc 16.1, hgb 10.2 plt 254  UA SG 1,016 , 100 protein.   Chest radiograph with bilateral hilar vascular congestion.   EKG 86 bpm, normal axis, normal intervals, sinus rhythm, no significant ST segment or T wave changes.  Patient was placed on diuretic therapy with improvement in her volume status.  She was placed on antibiotic therapy for bronchitis and urinary tract infection.  Hospitalization complicated with hyperkalemia.   06/17 patient had improvement in her symptoms and electrolytes, plan to discharge home and follow up as outpatient.   Assessment and Plan: * Acute on chronic heart failure with preserved ejection fraction (HFpEF) ([place]) Patient was admitted to the cardiac ward and was placed on furosemide IV for diuresis, negative fluid balance was achieved, - 3,628 ml, with significant improvement in her symptoms.   Echocardiogram from 02/23, with preserved LV systolic function EF 55 to 60%, preserved RV systolic function, no significant valvular disease. (noted poor windows).   Patient will continue heart failure management with carvedilol, and daily furosemide 40 mg. Holding on RAS inhibition due to risk of hyperkalemia and worsening renal function. Plan to follow up renal function in 7 days as outpatient.    Coronary artery disease of native artery of native heart with stable angina pectoris (HCC) No chest pain, no clinical sings of acute coronary syndrome.  Dual antiplatelet therapy with aspirin and clopidogrel. Continue with atorvastatin and isosorbide.   COPD with  chronic bronchitis ([place]) Patient was diagnosed with bronchitis and early pneumonia (present on admission).  Placed on antibiotic therapy with good toleration.   Follow up chest radiograph with bibasilar atelectasis, more right than left.   Urine infection, present on admission.  Urine  analysis with 10 to 20 wbc, urine culture positive for E coli >100,000 CFU. Patient was placed on oral antibiotic therapy with good toleration.  Plan to complete 5 days of cefdinir.       Type 2 diabetes mellitus with hyperlipidemia (HCC) Her glucose remained stable, she was continued on long acting insulin with Semglee 25 units daily along with insulin sliding scale for glucose cover and monitoring.   Continue statin therapy.   Essential hypertension Continue blood pressure control with carvedilol and isosorbide.   Acute kidney injury superimposed on chronic kidney disease (HCC) Hyperkalemia, hyponatremia.   Patient tolerated well diuresis, with improvement in volume status.  Serum cr reached 2,30, at the time of discharge is down to 1,53, K is 4,7 and serum bicarbonate at 27, Na 133.  Patient will continue diuresis with oral furosemide, and instructions to follow up renal function in 7 days as outpatient.    Anxiety associated with depression Continue home duloxetine, during her hospitalization she was placed on hydroxyzine with good toleration.   Chronic pain syndrome Continue Percocet q12h as needed.  Breast cancer ([place]) 09/2021 outpatient oncology follow up. Plan to continue with anastroazole.  Iron deficiency anemia, getting IV iron infusions.   Class 3 obesity (HCC) Calculated BMI 49,4 Patient is wheelchair bound at home. Continue with home PT          Consultants: none  Procedures performed: none   Disposition: Home Diet recommendation:  Cardiac diet DISCHARGE MEDICATION: Allergies as of 10/22/2021       Reactions   Sulfonamide Derivatives Swelling, Other (See Comments)   Mouth swelling- no resp issues noted   Lokelma [sodium Zirconium Cyclosilicate]    Headache, stomach upset   Aricept [donepezil Hcl] Diarrhea, Nausea And Vomiting   GI upset/loose stools   Tramadol Nausea And Vomiting        Medication List     STOP taking these  medications    oxyCODONE 5 MG immediate release tablet Commonly known as: Oxy IR/ROXICODONE       TAKE these medications    albuterol 108 (90 Base) MCG/ACT inhaler Commonly known as: ProAir HFA Inhale 1-2 puffs into the lungs every 6 (six) hours as needed for wheezing or shortness of breath.   anastrozole 1 MG tablet Commonly known as: ARIMIDEX TAKE 1 TABLET BY MOUTH EVERY DAY What changed: when to take this   Aspirin Low Dose 81 MG tablet Generic drug: aspirin EC TAKE 1 TABLET BY MOUTH EVERY DAY What changed:  how much to take when to take this   atorvastatin 80 MG tablet Commonly known as: LIPITOR TAKE 1 TABLET BY MOUTH EVERY DAY What changed: when to take this   budesonide-formoterol 160-4.5 MCG/ACT inhaler Commonly known as: SYMBICORT Inhale 2 puffs into the lungs 2 (two) times daily.   carvedilol 12.5 MG tablet Commonly known as: COREG TAKE 1 TABLET BY MOUTH 2 TIMES DAILY What changed: when to take this   cefdinir 300 MG capsule Commonly known as: OMNICEF Take 1 capsule (300 mg total) by mouth every 12 (twelve) hours for 5 days.   cetirizine 10 MG tablet Commonly known as: ZYRTEC TAKE 1 TABLET BY MOUTH EVERY DAY What changed:  when to take this  reasons to take this   clopidogrel 75 MG tablet Commonly known as: PLAVIX Take 1 tablet (75 mg total) by mouth daily. What changed: when to take this   diclofenac Sodium 1 % Gel Commonly known as: Voltaren Apply 2 g topically 4 (four) times daily. What changed:  when to take this additional instructions   DULoxetine 60 MG capsule Commonly known as: CYMBALTA TAKE 1 CAPSULE BY MOUTH EVERY DAY FOR anxiety What changed:  how much to take how to take this when to take this additional instructions   febuxostat 40 MG tablet Commonly known as: ULORIC TAKE 1 TABLET BY MOUTH EVERY DAY What changed: when to take this   feeding supplement (GLUCERNA SHAKE) Liqd Take 237 mLs by mouth 3 (three) times daily  between meals.   fluticasone 50 MCG/ACT nasal spray Commonly known as: FLONASE Place 1 spray into both nostrils in the morning and at bedtime. What changed:  when to take this reasons to take this   folic acid 1 MG tablet Commonly known as: FOLVITE TAKE 1 TABLET BY MOUTH EVERY DAY What changed: when to take this   FreeStyle Libre 14 Day Sensor Misc PLACE 1 DEVICE ON THE SKIN AS DIRECTED EVERY 14 DAYS What changed: See the new instructions.   furosemide 40 MG tablet Commonly known as: LASIX Take 1 tablet (40 mg total) by mouth daily. What changed:  medication strength how much to take when to take this reasons to take this   guaiFENesin 600 MG 12 hr tablet Commonly known as: Mucinex Take 1 tablet (600 mg total) by mouth 2 (two) times daily as needed. What changed: reasons to take this   hydrOXYzine 10 MG tablet Commonly known as: ATARAX Take 1 tablet (10 mg total) by mouth every 8 (eight) hours as needed for anxiety.   isosorbide mononitrate 60 MG 24 hr tablet Commonly known as: IMDUR TAKE 1 TABLET BY MOUTH EVERY DAY What changed: when to take this   linaclotide 145 MCG Caps capsule Commonly known as: LINZESS Take 1 capsule (145 mcg total) by mouth daily before breakfast. What changed:  when to take this reasons to take this   meclizine 25 MG tablet Commonly known as: ANTIVERT TAKE 1 TABLET BY MOUTH THREE TIMES DAILY AS NEEDED FOR DIZZINESS What changed: See the new instructions.   memantine 5 MG tablet Commonly known as: NAMENDA TAKE 2 TABLETS BY MOUTH EVERY DAY and TAKE 1 TABLET BY MOUTH EVERY EVENING What changed:  how much to take how to take this when to take this additional instructions   nitroGLYCERIN 0.4 MG SL tablet Commonly known as: Nitrostat Take one tablet under the tongue every 5 minutes as needed for chest pain   NovoLOG FlexPen 100 UNIT/ML FlexPen Generic drug: insulin aspart Inject 16 Units into the skin 3 (three) times daily with  meals.   nystatin cream Commonly known as: MYCOSTATIN Apply 1 application topically 2 (two) times daily. What changed:  when to take this reasons to take this   oxyCODONE-acetaminophen 7.5-325 MG tablet Commonly known as: Percocet Take 1 tablet by mouth every 12 (twelve) hours as needed for moderate pain or severe pain. Must last 30 days. Start taking on: November 11, 2021   pantoprazole 20 MG tablet Commonly known as: PROTONIX Take 1 tablet (20 mg total) by mouth daily. What changed: when to take this   polyethylene glycol powder 17 GM/SCOOP powder Commonly known as: MiraLax 17 gm mix with liquid twice daily as needed  What changed:  how much to take how to take this when to take this reasons to take this additional instructions   sodium polystyrene 15 GM/60ML suspension Commonly known as: SPS TAKE 56ms BY MOUTH ONCE A WEEK TO keep potassium down What changed:  how much to take how to take this when to take this additional instructions   Toujeo Max SoloStar 300 UNIT/ML Solostar Pen Generic drug: insulin glargine (2 Unit Dial) Inject 42 Units into the skin daily. Patient assistance program provides What changed: when to take this   Vitamin D (Ergocalciferol) 1.25 MG (50000 UNIT) Caps capsule Commonly known as: DRISDOL TAKE 1 CAPSULE BY MOUTH every 7 days What changed: when to take this        Follow-up Information     [facility] in 8 day(s).   Specialty: Cardiology Why: Hospital follow up PLEASE bring a curent medication list to appointment FREE valet parking, [address], [address] Contact information: [id number] Follow up.   Why: Your home health has been set up with [facility]. The office will call you with start of care. If you have any questions please call the number listed  above. Contact information: [phone]                Discharge Exam: Filed Weights   10/20/21 0413 10/21/21 0443 10/22/21 0612  Weight: (!) 146.3 kg (!) 142.9 kg (!) 143.3 kg   BP (!) 172/71 (BP Location: Right Wrist)   Pulse 80   Temp 97.8 F (36.6 C) (Oral)   Resp 20   Ht '5\' 7"'$  (1.702 m)   Wt (!) 143.3 kg   SpO2 90%   BMI 49.48 kg/m   Patient is feeling better, dyspnea and edema have resolved  Neurology awake and alert ENT with no pallor Cardiovascular with S1 and S2 present and rhythmic Respiratory with no wheezing or rhonchi Abdomen soft and not distended No lower extremity edema.   Condition at discharge: stable  The results of significant diagnostics from this hospitalization (including imaging, microbiology, ancillary and laboratory) are listed below for reference.   Imaging Studies: DG Chest 2 View  Result Date: 10/16/2021 CLINICAL DATA:  Productive cough for 2 weeks with shortness of breath and abdominal pain. EXAM: CHEST - 2 VIEW COMPARISON:  Chest radiographs 10/13/2021 FINDINGS: The patient is slightly rotated to the right. The cardiac silhouette is accentuated by low lung volumes and AP technique. Aortic atherosclerosis is noted. Mild central pulmonary vascular congestion is unchanged. Increased bibasilar opacities likely reflect atelectasis. Trace pleural effusions are not excluded. No overt pulmonary edema or pneumothorax is identified. No acute osseous abnormality is seen. IMPRESSION: Low lung volumes with mild pulmonary vascular congestion and increased bibasilar atelectasis. Electronically Signed   By: ALogan BoresM.D.   On: 10/16/2021 11:07   DG Chest 2 View  Result Date: 10/13/2021 CLINICAL DATA:  Shortness of breath and abdominal pain EXAM: CHEST - 2 VIEW COMPARISON:  07/04/2021 FINDINGS: Similar low lung volumes with some degree of residual central vascular congestion and basilar atelectasis. No  current CHF pattern or definite pneumonia. Negative for effusion or pneumothorax. Trachea midline. Normal heart size. Aorta atherosclerotic. Degenerative changes noted of the spine and left shoulder. IMPRESSION: Low volume exam with vascular congestion and basilar  atelectasis. Electronically Signed   By: Jerilynn Mages.  Shick M.D.   On: 10/13/2021 11:44    Microbiology: Results for orders placed or performed during the hospital encounter of 10/13/21  MRSA Next Gen by PCR, Nasal     Status: None   Collection Time: 10/13/21 10:48 PM   Specimen: Nasal Mucosa; Nasal Swab  Result Value Ref Range Status   MRSA by PCR Next Gen NOT DETECTED NOT DETECTED Final    Comment: (NOTE) The GeneXpert MRSA Assay (FDA approved for NASAL specimens only), is one component of a comprehensive MRSA colonization surveillance program. It is not intended to diagnose MRSA infection nor to guide or monitor treatment for MRSA infections. Test performance is not FDA approved in patients less than 18 years old. Performed at [facility], [place] [address], [place], [place] [address]   Urine Culture     Status: Abnormal   Collection Time: 10/15/21  7:57 AM   Specimen: Urine, Clean Catch  Result Value Ref Range Status   Specimen Description URINE, CLEAN CATCH  Final   Special Requests   Final    NONE Performed at [facility], [place] [address], [place], [place] [address]    Culture >=100,000 COLONIES/mL ESCHERICHIA COLI (A)  Final   Report Status 10/17/2021 FINAL  Final   Organism ID, Bacteria ESCHERICHIA COLI (A)  Final      Susceptibility   Escherichia coli - MIC*    AMPICILLIN <=2 SENSITIVE Sensitive     CEFAZOLIN <=4 SENSITIVE Sensitive     CEFEPIME <=0.12 SENSITIVE Sensitive     CEFTRIAXONE <=0.25 SENSITIVE Sensitive     CIPROFLOXACIN >=4 RESISTANT Resistant     GENTAMICIN <=1 SENSITIVE Sensitive     IMIPENEM <=0.25 SENSITIVE Sensitive     NITROFURANTOIN <=16 SENSITIVE Sensitive     TRIMETH/SULFA >=320  RESISTANT Resistant     AMPICILLIN/SULBACTAM <=2 SENSITIVE Sensitive     PIP/TAZO <=4 SENSITIVE Sensitive     * >=100,000 COLONIES/mL ESCHERICHIA COLI   *Note: Due to a large number of results and/or encounters for the requested time period, some results have not been displayed. A complete set of results can be found in Results Review.    Labs: CBC: Recent Labs  Lab 10/18/21 0320 10/19/21 0308 10/20/21 0047 10/21/21 0211 10/22/21 0004  WBC 13.8* 14.9* 14.7* 15.7* 14.0*  HGB 8.9* 8.4* 8.9* 9.1* 8.6*  HCT 28.3* 26.6* 28.6* 28.7* 27.9*  MCV 93.7 94.3 94.7 93.2 95.2  PLT 211 240 241 276 481   Basic Metabolic Panel: Recent Labs  Lab 10/16/21 0129 10/17/21 0115 10/19/21 1231 10/20/21 0047 10/20/21 1803 10/21/21 0211 10/22/21 0004  NA 133*   < > 132* 133* 134* 134* 133*  K 4.6   < > 5.2* 6.2* 6.3* 5.4* 4.7  CL 100   < > 100 100 99 100 98  CO2 26   < > '22 22 24 23 27  '$ GLUCOSE 200*   < > 246* 145* 283* 143* 129*  BUN 35*   < > 47* 48* 46* 42* 38*  CREATININE 2.30*   < > 1.75* 1.73* 1.69* 1.52* 1.53*  CALCIUM 8.2*   < > 8.7* 8.6* 9.2 8.9 8.4*  MG 1.7  --   --   --   --   --   --    < > = values in this interval not displayed.   Liver Function Tests: No results for input(s): "AST", "ALT", "ALKPHOS", "BILITOT", "PROT", "ALBUMIN" in  the last 168 hours. CBG: Recent Labs  Lab 10/21/21 0632 10/21/21 1121 10/21/21 1609 10/21/21 2112 10/22/21 0640  GLUCAP 170* 176* 258* 184* 135*    Discharge time spent: greater than 30 minutes.  Signed: Tawni Millers, MD [facility] 10/22/2021

## 2021-10-24 ENCOUNTER — Telehealth: Payer: Self-pay

## 2021-10-24 NOTE — Telephone Encounter (Signed)
Transition Care Management Follow-up Telephone Call Date of discharge and from where: [facility] 10/13/21-10/22/21 How have you been since you were released from the hospital? Per patient's daughter and POA, patient is doing better. She is eating about 1/2 of what she was before she was ill and is taking limited fluids. Any questions or concerns? Yes- see discharge instructions  Items Reviewed: Did the pt receive and understand the discharge instructions provided? No - Patients daughter states she left the copy of the patients discharge instructions at the hospital.  Offered to send to daughter via secure email.  She provided [email or web address] and verified with read back to [place].  Securely emailed to address provided.  Reviewed with [name]. Medications obtained and verified? Yes  Other? No  Any new allergies since your discharge? No  Dietary orders reviewed? Yes Do you have support at home? Yes   Home Care and Equipment/Supplies: Were home health services ordered? yes If so, what is the name of the agency? [facility]  Has the agency set up a time to come to the patient's home? No- they expect to hear form them soon.  This RNCM explained to daughter that since her mom has [employer] she is entitled to 20 hours of custodial care after a hospital stay.  Advised she should contact [facility] directly and inquire how to set up. Were any new equipment or medical supplies ordered?  No What is the name of the medical supply agency? N/A Were you able to get the supplies/equipment? not applicable Do you have any questions related to the use of the equipment or supplies? No  Functional Questionnaire: (I = Independent and D = Dependent) ADLs: D  Bathing/Dressing- D  Meal Prep- D  Eating- I  Maintaining continence- D  Transferring/Ambulation- D  Managing Meds- D  Follow up appointments reviewed:  PCP Hospital f/u appt confirmed? No  To be scheduled Specialist Hospital f/u appt confirmed?  Yes  [facility] 10/25/21 @ 12:00. Are transportation arrangements needed? No  If their condition worsens, is the pt aware to call PCP or go to the Emergency Dept.? Yes Was the patient provided with contact information for the PCP's office or ED? Yes Was to pt encouraged to call back with questions or concerns? Yes Johnney Killian, RN, BSN, CCM Care Management Coordinator [facility] Phone: [phone]: [phone]

## 2021-10-24 NOTE — Progress Notes (Signed)
HEART & VASCULAR TRANSITION OF CARE CONSULT NOTE     Referring Physician: Dr Broadus John Primary Care: [name]  Primary Cardiologist: Dr Johney Frame  HPI: Referred to clinic by  for heart failure consultation.   [name] is a 87 year old with a h/o HTN, hyperlipidemia, CAD (BMS to RCA 2004, DESx2 to mid and proximal RCA 2012),  left breast cancer, DMI, IDA, dementia, and HFpEF.   Admitted 10/13/21 with A/C HFpEF. Diuresed with IV lasix and later transitioned to lasix 40 mg po daily. Discharge weight 315 pounds. Discharged 10/22/21.   Presents today with her daughter and granddaughter. Wheelchair bound. Spends most of her time in bed or wheelchair. Requires assistance with ADLs and medications. Fatigued with any movement out of the bed. Denies PND/Orthopnea. Appetite poor.  No fever or chills. Unable to weigh at home due to balance.  Taking all medications. She did not take medications today.   Cardiac Testing  Echo 06/2021 EF 55-60% . Grade IDD, RV normal   Cath 2021 Multivessel CAD with severe (95%) IntraStent stenosis in the proximal RCA and de novo stenosis distal to mid RCA stent (85%) -> both treated with DES PCI using Resolute Onyx DES stents (distal-3.0 mm x 18 mm with tapered post dilation 4.2-3.3 mm; proximal 4.0 mm 18 mm postdilated to 4.7 mm.) Diffuse mild to moderate calcified disease in the distal Left Main as well as ostial and proximal LCx and LAD with no severe stenoses. Borderline elevated LVEDP with severe systemic hypertension Review of Systems: [y] = yes, '[ ]'$  = no   General: Weight gain '[ ]'$ ; Weight loss '[ ]'$ ; Anorexia '[ ]'$ ; Fatigue [ Y]; Fever '[ ]'$ ; Chills '[ ]'$ ; Weakness '[ ]'$   Cardiac: Chest pain/pressure '[ ]'$ ; Resting SOB '[ ]'$ ; Exertional SOB [ Y]; Orthopnea '[ ]'$ ; Pedal Edema '[ ]'$ ; Palpitations '[ ]'$ ; Syncope '[ ]'$ ; Presyncope '[ ]'$ ; Paroxysmal nocturnal dyspnea'[ ]'$   Pulmonary: Cough '[ ]'$ ; Wheezing'[ ]'$ ; Hemoptysis'[ ]'$ ; Sputum '[ ]'$ ; Snoring '[ ]'$   GI: Vomiting'[ ]'$ ; Dysphagia'[ ]'$ ; Melena[  ]; Hematochezia '[ ]'$ ; Heartburn'[ ]'$ ; Abdominal pain '[ ]'$ ; Constipation '[ ]'$ ; Diarrhea '[ ]'$ ; BRBPR '[ ]'$   GU: Hematuria'[ ]'$ ; Dysuria '[ ]'$ ; Nocturia'[ ]'$   Vascular: Pain in legs with walking '[ ]'$ ; Pain in feet with lying flat '[ ]'$ ; Non-healing sores '[ ]'$ ; Stroke '[ ]'$ ; TIA '[ ]'$ ; Slurred speech '[ ]'$ ;  Neuro: Headaches'[ ]'$ ; Vertigo'[ ]'$ ; Seizures'[ ]'$ ; Paresthesias'[ ]'$ ;Blurred vision '[ ]'$ ; Diplopia '[ ]'$ ; Vision changes '[ ]'$   Ortho/Skin: Arthritis '[ ]'$ ; Joint pain [ Y]; Muscle pain '[ ]'$ ; Joint swelling '[ ]'$ ; Back Pain [Y ]; Rash '[ ]'$   Psych: Depression'[ ]'$ ; Anxiety'[ ]'$   Heme: Bleeding problems '[ ]'$ ; Clotting disorders '[ ]'$ ; Anemia [ Y]  Endocrine: Diabetes [ Y]; Thyroid dysfunction'[ ]'$    Past Medical History:  Diagnosis Date   Allergy    takes Mucinex daily as needed   Anemia, unspecified    Anxiety    takes Clonazepam daily as needed   Arthritis    Breast cancer ([place])    Chronic diastolic CHF (congestive heart failure) (HCC)    takes Furosemide daily   CKD (chronic kidney disease), stage III ([place])    Coronary artery disease    a. s/p IMI 2004 tx with BMS to RCA;  b. s/p Promus DES to RCA 2/12 c. abnormal nuc 2016 -> cath with med rx. d. NSTEMI 02/2020  mv CAD with severe ISR in pRCA and  m/dRCA lesion both treated with DES.   Dementia ([place])    Depression    takes Cymbalta daily   Diabetes mellitus    insulin daily   GERD (gastroesophageal reflux disease)    takes Protonix daily   Gout, unspecified    Headache    occasionally   History of blood clots    Hyperlipidemia    takes Pravastatin daily   Hypertension    Insomnia    Joint pain    Joint swelling    Morbid obesity ([place])    Myocardial infarction ([place]) 2004   Non-STEMI (non-ST elevated myocardial infarction) ([place]) 02/15/2020   Numbness    Obstructive sleep apnea    does not wear cpap   Osteoarthritis    Osteoarthritis    Osteoarthrosis, unspecified whether generalized or localized, lower leg    Pain, chronic    Polymyalgia rheumatica (HCC)    Pulmonary  emboli ([place]) 01/2012   felt to need lifelong anticoagulation but Xarelto dc in 02/2020 due to need for DAPT   Urinary incontinence    takes Linzess daily   Vertigo    hx of;was taking Meclizine if needed   Wears dentures     Current Outpatient Medications  Medication Sig Dispense Refill   albuterol (PROAIR HFA) 108 (90 Base) MCG/ACT inhaler Inhale 1-2 puffs into the lungs every 6 (six) hours as needed for wheezing or shortness of breath. 6.7 g 1   anastrozole (ARIMIDEX) 1 MG tablet TAKE 1 TABLET BY MOUTH EVERY DAY 28 tablet 1   ASPIRIN LOW DOSE 81 MG EC tablet TAKE 1 TABLET BY MOUTH EVERY DAY 30 tablet 11   atorvastatin (LIPITOR) 80 MG tablet TAKE 1 TABLET BY MOUTH EVERY DAY 90 tablet 3   budesonide-formoterol (SYMBICORT) 160-4.5 MCG/ACT inhaler Inhale 2 puffs into the lungs 2 (two) times daily. 1 each 3   carvedilol (COREG) 12.5 MG tablet TAKE 1 TABLET BY MOUTH 2 TIMES DAILY 180 tablet 1   cefdinir (OMNICEF) 300 MG capsule Take 1 capsule (300 mg total) by mouth every 12 (twelve) hours for 5 days. 10 capsule 0   cetirizine (ZYRTEC) 10 MG tablet Take 10 mg by mouth daily.     clopidogrel (PLAVIX) 75 MG tablet Take 1 tablet (75 mg total) by mouth daily. 90 tablet 3   Continuous Blood Gluc Sensor (FREESTYLE LIBRE 14 DAY SENSOR) MISC PLACE 1 DEVICE ON THE SKIN AS DIRECTED EVERY 14 DAYS (Patient taking differently: Inject 1 Device into the skin every 14 (fourteen) days.) 6 each 0   diclofenac Sodium (VOLTAREN) 1 % GEL Apply 2 g topically 4 (four) times daily. 150 g 2   DULoxetine (CYMBALTA) 60 MG capsule TAKE 1 CAPSULE BY MOUTH EVERY DAY FOR anxiety 30 capsule 5   febuxostat (ULORIC) 40 MG tablet TAKE 1 TABLET BY MOUTH EVERY DAY 30 tablet 5   feeding supplement, GLUCERNA SHAKE, (GLUCERNA SHAKE) LIQD Take 237 mLs by mouth 3 (three) times daily between meals. 21330 mL 0   fluticasone (FLONASE) 50 MCG/ACT nasal spray Place 1 spray into both nostrils in the morning and at bedtime. 85.6 mL 3   folic  acid (FOLVITE) 1 MG tablet TAKE 1 TABLET BY MOUTH EVERY DAY 90 tablet 1   furosemide (LASIX) 40 MG tablet Take 1 tablet (40 mg total) by mouth daily. 30 tablet 0   guaiFENesin (MUCINEX) 600 MG 12 hr tablet Take 1 tablet (600 mg total) by mouth 2 (two) times daily as needed. 60 tablet  3   hydrOXYzine (ATARAX) 10 MG tablet Take 1 tablet (10 mg total) by mouth every 8 (eight) hours as needed for anxiety. 15 tablet 0   insulin glargine, 2 Unit Dial, (TOUJEO MAX SOLOSTAR) 300 UNIT/ML Solostar Pen Inject 42 Units into the skin daily. Patient assistance program provides 15 mL 6   isosorbide mononitrate (IMDUR) 60 MG 24 hr tablet TAKE 1 TABLET BY MOUTH EVERY DAY 30 tablet 5   linaclotide (LINZESS) 145 MCG CAPS capsule Take 1 capsule (145 mcg total) by mouth daily before breakfast. (Patient taking differently: Take 145 mcg by mouth daily as needed (for constipation).) 30 capsule 2   meclizine (ANTIVERT) 25 MG tablet TAKE 1 TABLET BY MOUTH THREE TIMES DAILY AS NEEDED FOR DIZZINESS (Patient taking differently: Take 25 mg by mouth in the morning.) 30 tablet 11   memantine (NAMENDA) 5 MG tablet TAKE 2 TABLETS BY MOUTH EVERY DAY and TAKE 1 TABLET BY MOUTH EVERY EVENING 270 tablet 5   nitroGLYCERIN (NITROSTAT) 0.4 MG SL tablet Take one tablet under the tongue every 5 minutes as needed for chest pain 50 tablet 0   NOVOLOG FLEXPEN 100 UNIT/ML FlexPen Inject 16 Units into the skin 3 (three) times daily with meals.     nystatin cream (MYCOSTATIN) Apply 1 application topically 2 (two) times daily. 30 g 0   [START ON 11/11/2021] oxyCODONE-acetaminophen (PERCOCET) 7.5-325 MG tablet Take 1 tablet by mouth every 12 (twelve) hours as needed for moderate pain or severe pain. Must last 30 days. 60 tablet 0   pantoprazole (PROTONIX) 20 MG tablet Take 1 tablet (20 mg total) by mouth daily. 30 tablet 5   polyethylene glycol powder (MIRALAX) 17 GM/SCOOP powder 17 gm mix with liquid twice daily as needed 255 g 0   sodium polystyrene  (SPS) 15 GM/60ML suspension TAKE 16ms BY MOUTH ONCE A WEEK TO keep potassium down (Patient taking differently: Take 15 g by mouth every Sunday.) 240 mL 2   Vitamin D, Ergocalciferol, (DRISDOL) 1.25 MG (50000 UNIT) CAPS capsule TAKE 1 CAPSULE BY MOUTH every 7 days (Patient taking differently: Take 50,000 Units by mouth every Monday.) 4 capsule 1   No current facility-administered medications for this encounter.    Allergies  Allergen Reactions   Sulfonamide Derivatives Swelling and Other (See Comments)    Mouth swelling- no resp issues noted   Lokelma [Sodium Zirconium Cyclosilicate]     Headache, stomach upset   Aricept [Donepezil Hcl] Diarrhea and Nausea And Vomiting    GI upset/loose stools   Tramadol Nausea And Vomiting      Social History   Socioeconomic History   Marital status: Widowed    Spouse name: Not on file   Number of children: 3   Years of education: Not on file   Highest education level: High school graduate  Occupational History   Occupation: retired  Tobacco Use   Smoking status: Never    Passive exposure: Past   Smokeless tobacco: Never   Tobacco comments:    Both parents smoked, patient was exposed/ "Raised up in smoke, my whole life."  Vaping Use   Vaping Use: Never used  Substance and Sexual Activity   Alcohol use: No    Alcohol/week: 0.0 standard drinks of alcohol   Drug use: No   Sexual activity: Not Currently  Other Topics Concern   Not on file  Social History Narrative   Not on file   Social Determinants of Health   Financial Resource Strain: Low  Risk  (10/14/2021)   Overall Financial Resource Strain (CARDIA)    Difficulty of Paying Living Expenses: Not very hard  Food Insecurity: No Food Insecurity (10/14/2021)   Hunger Vital Sign    Worried About Running Out of Food in the Last Year: Never true    Ran Out of Food in the Last Year: Never true  Transportation Needs: No Transportation Needs (10/14/2021)   PRAPARE - Radiographer, therapeutic (Medical): No    Lack of Transportation (Non-Medical): No  Physical Activity: Inactive (07/26/2017)   Exercise Vital Sign    Days of Exercise per Week: 0 days    Minutes of Exercise per Session: 0 min  Stress: Stress Concern Present (07/26/2017)   [place]    Feeling of Stress : Rather much  Social Connections: Moderately Integrated (07/26/2017)   Social Connection and Isolation Panel [NHANES]    Frequency of Communication with Friends and Family: Once a week    Frequency of Social Gatherings with Friends and Family: Three times a week    Attends Religious Services: 1 to 4 times per year    Active Member of Clubs or Organizations: Yes    Attends Archivist Meetings: 1 to 4 times per year    Marital Status: Widowed  Intimate Partner Violence: Not At Risk (07/26/2017)   Humiliation, Afraid, Rape, and Kick questionnaire    Fear of Current or Ex-Partner: No    Emotionally Abused: No    Physically Abused: No    Sexually Abused: No      Family History  Problem Relation Age of Onset   Breast cancer Mother 64   Heart disease Mother    Throat cancer Father    Hypertension Father    Arthritis Father    Diabetes Father    Arthritis Sister    Obesity Sister    Diabetes Sister    Heart disease Cousin    Colon cancer Neg Hx    Stomach cancer Neg Hx    Esophageal cancer Neg Hx     Vitals:   10/25/21 1209  BP: (!) 166/88  Pulse: 77  SpO2: 96%  Weight: (!) 140.6 kg (310 lb)   Wt Readings from Last 3 Encounters:  10/25/21 (!) 140.6 kg (310 lb)  10/22/21 (!) 143.3 kg (315 lb 14.7 oz)  10/11/21 (!) 155.1 kg (342 lb)    PHYSICAL EXAM: General:  Arrived in a wheelchair. No respiratory difficulty HEENT: normal Neck: supple. no JVD. Carotids 2+ bilat; no bruits. No lymphadenopathy or thryomegaly appreciated. Cor: PMI nondisplaced. Regular rate & rhythm. No rubs, gallops or murmurs. Lungs:  clear Abdomen: obese, soft, nontender, nondistended. No hepatosplenomegaly. No bruits or masses. Good bowel sounds. Extremities: no cyanosis, clubbing, rash, edema Neuro: alert & oriented x 3, cranial nerves grossly intact. moves all 4 extremities w/o difficulty. Affect pleasant.  ASSESSMENT & PLAN: 1. HFpEF Echo EF Ef 55-60% Grade II DD NYHA III. Difficult to know due to extreme mobility issues.  GDMT  Diuretic-Continue lasix 40 mg daily with extra 40 mg as needed for lower extremity edema. Daily weight will be difficult due to balance issues.  BB-Continue current dose.  Ace/ARB/ARNI- No recent hyperkalemia  MRA- No recent hyperkalemia  SGLT2i- no due to hygiene issues.   2. CKD IIIb -Creatinine baseline 1.5-1.7  - Check BMET   3. Hyperkalemia -Check BMET  4. HTN  -Elevated today but she did not have  her medications today.   5. Dementia -Per PCP -On namenda.   6. CAD -BMS to RCA 2004, DESx2 to mid and proximal RCA 2012.  -No chest pain.  -On asa + statin+ plavix.     Referred to HFSW (PCP, Medications, Transportation, ETOH Abuse, Drug Abuse, Insurance, Financial ): No Refer to Pharmacy: No Refer to Home Health:No Refer to Advanced Heart Failure Clinic: No   Refer to General Cardiology: Existing patient of Dr Johney Frame.   Follow up as needed.   Khadejah Son NP-C  12:11 PM

## 2021-10-25 ENCOUNTER — Ambulatory Visit (HOSPITAL_COMMUNITY)
Admission: RE | Admit: 2021-10-25 | Discharge: 2021-10-25 | Disposition: A | Discharge status: Home / Self Care | Payer: PPO | Source: Ambulatory Visit | Attending: Adult Health | Admitting: Adult Health

## 2021-10-25 ENCOUNTER — Telehealth: Payer: Self-pay

## 2021-10-25 ENCOUNTER — Encounter: Payer: Self-pay | Admitting: Physician Assistant

## 2021-10-25 ENCOUNTER — Telehealth (HOSPITAL_COMMUNITY): Payer: Self-pay

## 2021-10-25 VITALS — BP 166/88 | HR 77 | Wt 310.0 lb

## 2021-10-25 DIAGNOSIS — Z7982 Long term (current) use of aspirin: Secondary | ICD-10-CM | POA: Insufficient documentation

## 2021-10-25 DIAGNOSIS — E1022 Type 1 diabetes mellitus with diabetic chronic kidney disease: Secondary | ICD-10-CM | POA: Insufficient documentation

## 2021-10-25 DIAGNOSIS — I10 Essential (primary) hypertension: Secondary | ICD-10-CM

## 2021-10-25 DIAGNOSIS — F0390 Unspecified dementia without behavioral disturbance: Secondary | ICD-10-CM | POA: Insufficient documentation

## 2021-10-25 DIAGNOSIS — Z803 Family history of malignant neoplasm of breast: Secondary | ICD-10-CM | POA: Insufficient documentation

## 2021-10-25 DIAGNOSIS — Z7902 Long term (current) use of antithrombotics/antiplatelets: Secondary | ICD-10-CM | POA: Insufficient documentation

## 2021-10-25 DIAGNOSIS — I25118 Atherosclerotic heart disease of native coronary artery with other forms of angina pectoris: Secondary | ICD-10-CM | POA: Diagnosis not present

## 2021-10-25 DIAGNOSIS — E785 Hyperlipidemia, unspecified: Secondary | ICD-10-CM | POA: Insufficient documentation

## 2021-10-25 DIAGNOSIS — I2510 Atherosclerotic heart disease of native coronary artery without angina pectoris: Secondary | ICD-10-CM | POA: Diagnosis not present

## 2021-10-25 DIAGNOSIS — I5032 Chronic diastolic (congestive) heart failure: Secondary | ICD-10-CM | POA: Insufficient documentation

## 2021-10-25 DIAGNOSIS — Z8249 Family history of ischemic heart disease and other diseases of the circulatory system: Secondary | ICD-10-CM | POA: Insufficient documentation

## 2021-10-25 DIAGNOSIS — Z853 Personal history of malignant neoplasm of breast: Secondary | ICD-10-CM | POA: Diagnosis not present

## 2021-10-25 DIAGNOSIS — Z79899 Other long term (current) drug therapy: Secondary | ICD-10-CM | POA: Insufficient documentation

## 2021-10-25 DIAGNOSIS — Z993 Dependence on wheelchair: Secondary | ICD-10-CM | POA: Diagnosis not present

## 2021-10-25 DIAGNOSIS — Z794 Long term (current) use of insulin: Secondary | ICD-10-CM | POA: Diagnosis not present

## 2021-10-25 DIAGNOSIS — E875 Hyperkalemia: Secondary | ICD-10-CM | POA: Insufficient documentation

## 2021-10-25 DIAGNOSIS — Z833 Family history of diabetes mellitus: Secondary | ICD-10-CM | POA: Insufficient documentation

## 2021-10-25 DIAGNOSIS — N1832 Chronic kidney disease, stage 3b: Secondary | ICD-10-CM | POA: Insufficient documentation

## 2021-10-25 DIAGNOSIS — I130 Hypertensive heart and chronic kidney disease with heart failure and stage 1 through stage 4 chronic kidney disease, or unspecified chronic kidney disease: Secondary | ICD-10-CM | POA: Diagnosis not present

## 2021-10-25 DIAGNOSIS — Z955 Presence of coronary angioplasty implant and graft: Secondary | ICD-10-CM | POA: Insufficient documentation

## 2021-10-25 LAB — BASIC METABOLIC PANEL
Anion gap: 9 (ref 5–15)
BUN: 33 mg/dL — ABNORMAL HIGH (ref 8–23)
CO2: 29 mmol/L (ref 22–32)
Calcium: 8.6 mg/dL — ABNORMAL LOW (ref 8.9–10.3)
Chloride: 98 mmol/L (ref 98–111)
Creatinine, Ser: 1.47 mg/dL — ABNORMAL HIGH (ref 0.44–1.00)
GFR, Estimated: 35 mL/min — ABNORMAL LOW (ref >60)
Glucose, Bld: 220 mg/dL — ABNORMAL HIGH (ref 70–99)
Potassium: 4.7 mmol/L (ref 3.5–5.1)
Sodium: 136 mmol/L (ref 135–145)

## 2021-10-25 MED ORDER — FUROSEMIDE 40 MG PO TABS: 40.0000 mg | ORAL_TABLET | Freq: Every day | ORAL | 0 refills | Status: DC

## 2021-10-25 NOTE — Telephone Encounter (Signed)
Transition Care Management Unsuccessful Follow-up Telephone Call  Date of discharge and from where:  [facility]; 10/22/2021   Attempts:  2nd Attempt  Reason for unsuccessful TCM follow-up call:  Unable to reach patient, patient following up with cardiology on 10/25/2021

## 2021-10-25 NOTE — Telephone Encounter (Signed)
Called to confirm Heart & Vascular Transitions of Care appointment at 12noon. Patient reminded to bring all medications and pill box organizer with them. Confirmed patient has transportation. Gave directions, instructed to utilize [address] parking.  Confirmed appointment prior to ending call.

## 2021-10-25 NOTE — Patient Instructions (Signed)
Medication Changes:  Continue current regimen. May take extra 22mq as needed for for leg swelling (no more than 2 times per week).   Lab Work:  Labs done today, your results will be available in MyChart, we will contact you for abnormal readings.  Special Instructions // Education:  Do the following things EVERYDAY: Weigh yourself in the morning before breakfast. Write it down and keep it in a log. Take your medicines as prescribed Eat low salt foods--Limit salt (sodium) to 2000 mg per day.  Stay as active as you can everyday Limit all fluids for the day to less than 2 liters  [facility]

## 2021-10-26 ENCOUNTER — Telehealth: Payer: Self-pay | Admitting: *Deleted

## 2021-10-26 DIAGNOSIS — F0390 Unspecified dementia without behavioral disturbance: Secondary | ICD-10-CM | POA: Diagnosis not present

## 2021-10-26 DIAGNOSIS — I130 Hypertensive heart and chronic kidney disease with heart failure and stage 1 through stage 4 chronic kidney disease, or unspecified chronic kidney disease: Secondary | ICD-10-CM | POA: Diagnosis not present

## 2021-10-26 DIAGNOSIS — Z794 Long term (current) use of insulin: Secondary | ICD-10-CM | POA: Diagnosis not present

## 2021-10-26 DIAGNOSIS — G894 Chronic pain syndrome: Secondary | ICD-10-CM | POA: Diagnosis not present

## 2021-10-26 DIAGNOSIS — E1140 Type 2 diabetes mellitus with diabetic neuropathy, unspecified: Secondary | ICD-10-CM | POA: Diagnosis not present

## 2021-10-26 DIAGNOSIS — J449 Chronic obstructive pulmonary disease, unspecified: Secondary | ICD-10-CM | POA: Diagnosis not present

## 2021-10-26 DIAGNOSIS — N1831 Chronic kidney disease, stage 3a: Secondary | ICD-10-CM | POA: Diagnosis not present

## 2021-10-26 DIAGNOSIS — I2510 Atherosclerotic heart disease of native coronary artery without angina pectoris: Secondary | ICD-10-CM | POA: Diagnosis not present

## 2021-10-26 DIAGNOSIS — I5033 Acute on chronic diastolic (congestive) heart failure: Secondary | ICD-10-CM | POA: Diagnosis not present

## 2021-10-26 DIAGNOSIS — E1122 Type 2 diabetes mellitus with diabetic chronic kidney disease: Secondary | ICD-10-CM | POA: Diagnosis not present

## 2021-10-26 NOTE — Telephone Encounter (Signed)
[name] with [facility] Called requesting verbal orders for PT 2X2weeks and 1X3weeks.   Verbal orders given.

## 2021-10-27 ENCOUNTER — Encounter: Payer: Self-pay | Admitting: Family Medicine

## 2021-10-27 ENCOUNTER — Ambulatory Visit (INDEPENDENT_AMBULATORY_CARE_PROVIDER_SITE_OTHER): Payer: HMO | Admitting: Family Medicine

## 2021-10-27 VITALS — BP 130/82 | HR 77 | Temp 97.8°F | Ht 67.0 in | Wt 309.0 lb

## 2021-10-27 DIAGNOSIS — J449 Chronic obstructive pulmonary disease, unspecified: Secondary | ICD-10-CM

## 2021-10-27 DIAGNOSIS — R5381 Other malaise: Secondary | ICD-10-CM

## 2021-10-27 DIAGNOSIS — R059 Cough, unspecified: Secondary | ICD-10-CM

## 2021-10-27 DIAGNOSIS — I509 Heart failure, unspecified: Secondary | ICD-10-CM | POA: Diagnosis not present

## 2021-10-27 LAB — POC COVID19 BINAXNOW: SARS Coronavirus 2 Ag: NEGATIVE

## 2021-10-27 MED ORDER — NEBULIZER/TUBING/MOUTHPIECE KIT: 1.0000 [IU] | PACK | 0 refills | Status: DC | PRN

## 2021-10-27 MED ORDER — NEBULIZER SYSTEM ALL-IN-ONE MISC: 1.0000 [IU] | Status: DC | PRN

## 2021-10-27 MED ORDER — IPRATROPIUM-ALBUTEROL 0.5-2.5 (3) MG/3ML IN SOLN: 3.0000 mL | Freq: Four times a day (QID) | RESPIRATORY_TRACT | 0 refills | Status: DC | PRN

## 2021-10-27 NOTE — Patient Instructions (Signed)
Take breathing treatments every 4-6 hours. If having severe chest pain or shortness of breath, go to the ED.

## 2021-10-28 ENCOUNTER — Telehealth: Payer: HMO | Admitting: Family Medicine

## 2021-10-31 ENCOUNTER — Encounter (HOSPITAL_COMMUNITY): Payer: Self-pay

## 2021-10-31 ENCOUNTER — Encounter: Payer: Self-pay | Admitting: Hematology

## 2021-11-07 ENCOUNTER — Telehealth: Payer: Self-pay | Admitting: Family Medicine

## 2021-11-07 NOTE — Telephone Encounter (Signed)
[name] a detailed voice message to return call to office.

## 2021-11-07 NOTE — Telephone Encounter (Signed)
Pt daughter called ( pam) and stated that they are still waiting on a fax authorization to be sent over because the dr office never received it : fax # [phone]

## 2021-11-07 NOTE — Telephone Encounter (Signed)
Spoke with [name] and she provided me with fax number to [facility]. Faxed over approval for custodial care to [phone].

## 2021-11-09 ENCOUNTER — Other Ambulatory Visit: Payer: Self-pay | Admitting: Internal Medicine

## 2021-11-09 ENCOUNTER — Encounter (HOSPITAL_COMMUNITY): Payer: Self-pay

## 2021-11-09 ENCOUNTER — Ambulatory Visit: Payer: PPO | Admitting: Internal Medicine

## 2021-11-09 ENCOUNTER — Encounter: Payer: Self-pay | Admitting: Hematology

## 2021-11-09 DIAGNOSIS — E1140 Type 2 diabetes mellitus with diabetic neuropathy, unspecified: Secondary | ICD-10-CM

## 2021-11-10 ENCOUNTER — Ambulatory Visit (INDEPENDENT_AMBULATORY_CARE_PROVIDER_SITE_OTHER): Payer: HMO | Admitting: Family Medicine

## 2021-11-10 ENCOUNTER — Encounter: Payer: Self-pay | Admitting: Family Medicine

## 2021-11-10 VITALS — BP 138/88 | HR 75 | Temp 97.8°F

## 2021-11-10 DIAGNOSIS — F419 Anxiety disorder, unspecified: Secondary | ICD-10-CM | POA: Diagnosis not present

## 2021-11-10 DIAGNOSIS — J449 Chronic obstructive pulmonary disease, unspecified: Secondary | ICD-10-CM | POA: Diagnosis not present

## 2021-11-10 MED ORDER — NEBULIZER SYSTEM ALL-IN-ONE MISC: 1.0000 | 0 refills | Status: DC | PRN

## 2021-11-10 MED ORDER — ESCITALOPRAM OXALATE 10 MG PO TABS: 10.0000 mg | ORAL_TABLET | Freq: Every day | ORAL | 0 refills | Status: DC

## 2021-11-10 NOTE — Patient Instructions (Addendum)
Wean duloxetine take every other day for two weeks, then take every two days for 1 week, then take every 3 days for one week, then stop.   Then start new new escitalopram, 1/2 tablet once a day for once a day for two weeks, then go to whole tablet two weeks.

## 2021-11-10 NOTE — Assessment & Plan Note (Signed)
Elevated GAD-7 History of depression, however PHQ-9 was normal on 10/2021 Discussed at length the difficulties of managing this given patient longstanding use of duloxetine, expected histamines for chronic pain management as well, however patient declines that she is having this for pain management or that she knows of any benefit from this Weaning duloxetine over several weeks Start escitalopram 10 mg daily Follow-up 2 months

## 2021-11-10 NOTE — Assessment & Plan Note (Signed)
Improved We will resend nebulizer prescription to have as needed

## 2021-11-10 NOTE — Progress Notes (Signed)
[name] is a 87 y.o. female who presents today for an office visit.  Assessment/Plan:   Problem List Items Addressed This Visit       Respiratory   COPD with chronic bronchitis ([place]) - Primary    Improved We will resend nebulizer prescription to have as needed      Relevant Medications   Nebulizer System All-In-One MISC     Other   Anxiety    Elevated GAD-7 History of depression, however PHQ-9 was normal on 10/2021 Discussed at length the difficulties of managing this given patient longstanding use of duloxetine, expected histamines for chronic pain management as well, however patient declines that she is having this for pain management or that she knows of any benefit from this Weaning duloxetine over several weeks Start escitalopram 10 mg daily Follow-up 2 months      Relevant Medications   escitalopram (LEXAPRO) 10 MG tablet       Subjective:  HPI:  [name] is a 87 y.o. female who has Hyperlipidemia; Morbid (severe) obesity due to excess calories ([place]); Microcytic anemia; TENSION HEADACHE; Essential hypertension; Coronary artery disease of native artery of native heart with stable angina pectoris ([place]); URINARY INCONTINENCE; Chronic diastolic congestive heart failure ([place]); Long term current use of anticoagulant therapy; Insomnia; OSA (obstructive sleep apnea); Type 2 diabetes mellitus with diabetic foot deformity ([place]); Estrogen deficiency; COPD with chronic bronchitis ([place]); CN (constipation); Gout; Acute kidney injury superimposed on chronic kidney disease ([place]); Type 2 diabetes mellitus with stage 3b chronic kidney disease, with long-term current use of insulin ([place]); Vitamin D deficiency; Severe episode of recurrent major depressive disorder, without psychotic features ([place]); Endometrial polyp; GERD with esophagitis; Dementia ([place]); Type 2 diabetes mellitus with hyperlipidemia ([place]); Type 2 diabetes mellitus with hyperglycemia, with long-term current use  of insulin ([place]); Moderate nonproliferative diabetic retinopathy of both eyes without macular edema associated with type 2 diabetes mellitus ([place]); Chronic radicular lumbar pain; Lumbar spondylosis; Spinal stenosis, lumbar region, with neurogenic claudication; Bilateral primary osteoarthritis of knee; Iron deficiency anemia; Right elbow pain; Chronic pain of right hand; Breast cancer ([place]); and Anxiety on their problem list..   She  has a past medical history of Abdominal pain (01/26/2012), Acute kidney injury superimposed on chronic kidney disease ([place]) (12/18/2014), Allergy, Anemia, unspecified, Anxiety, Anxiety associated with depression (06/08/2017), Arthritis, Breast cancer ([place]), Breast cancer screening (02/19/2014), Breast mass (10/27/2014), Chronic diastolic CHF (congestive heart failure) ([place]), Chronic pain syndrome (02/06/2015), CKD (chronic kidney disease), stage III ([place]), Coronary artery disease, Debility (08/01/2012), Dementia ([place]), Depression, Diabetes mellitus, DOE (dyspnea on exertion) (06/24/2014), Dysphagia, unspecified(787.20) (07/31/2012), Fungal dermatitis (11/13/2007), GERD (gastroesophageal reflux disease), Gout, unspecified, Headache, History of blood clots, History of deep venous thrombosis (01/27/2012), History of pulmonary embolus (PE) (04/22/2014), Hyperkalemia (07/26/2017), Hyperlipidemia, Hypertension, Insomnia, Joint pain, Joint swelling, Morbid obesity ([place]), Multiple benign nevi (11/15/2016), Myocardial infarction ([place]) (2004), Non-STEMI (non-ST elevated myocardial infarction) ([place]) (02/15/2020), Numbness, Obstructive sleep apnea, Osteoarthritis, Osteoarthritis, Osteoarthrosis, unspecified whether generalized or localized, lower leg, Pain in the chest (12/31/2013), Pain, chronic, Personal history of other diseases of the digestive system (12/03/2006), Polymyalgia rheumatica ([place]), Presence of stent in right coronary artery, Pulmonary emboli ([place]) (01/2012), Situational depression (06/04/2009),  Syncope, vasovagal (07/04/2021), Urinary incontinence, Vaginitis and vulvovaginitis (06/23/2016), Vertigo, and Wears dentures..   She presents with chief complaint of Follow-up (2 months patients states that she has noticed floaters in her eyes. Patient states that her cough has gone away with mucinex , but her voice hasn't  came back. ) .  Patient is present with her daughter she also supplement history.  Patient reports that she has floaters in her eyes, does not any blurry vision.  She does see an ophthalmologist and says that she will reach out to them.  There is no eye pain with this.  Patient reports that her cough and congestion and trouble breathing have improved.  Although nebulizer prescription was sent the patient, she was unable to get it as her pharmacy did not have it.  She has no shortness of breath or chest pain.  Reports some mild pharyngitis.  Able to talk without issue at the moment.  Patient reports history of anxiety.  She is taking Cymbalta for this.  She has been on it "a long time".  Does not know how long she has been on this.  She does report some intermittent panic attacks.  She says that she is having difficulty sleeping, however her daughters tells that she sleeps well both day and night.  Patient does have a history of CPAP intolerant OSA.          Objective:  Physical Exam: BP 138/88 (BP Location: Right Arm, Patient Position: Sitting, Cuff Size: Large)   Pulse 75   Temp 97.8 F (36.6 C) (Temporal)   SpO2 98%    General: No acute distress. Awake and conversant.  Eyes: Normal conjunctiva, anicteric. Round symmetric pupils.  ENT: Hearing grossly intact. No nasal discharge.  Neck: Neck is supple. No masses or thyromegaly.  Respiratory: Respirations are non-labored. No auditory wheezing.  Skin: Warm. No rashes or ulcers.  Psych: Alert and oriented. Cooperative, Appropriate mood and affect, Normal judgment.  CV: No cyanosis or JVD MSK: Normal ambulation. No  clubbing  Neuro: Sensation and CN II-XII grossly normal.        Alesia Banda, MD, MS

## 2021-11-11 DIAGNOSIS — I2510 Atherosclerotic heart disease of native coronary artery without angina pectoris: Secondary | ICD-10-CM | POA: Diagnosis not present

## 2021-11-11 DIAGNOSIS — F0390 Unspecified dementia without behavioral disturbance: Secondary | ICD-10-CM | POA: Diagnosis not present

## 2021-11-11 DIAGNOSIS — I5033 Acute on chronic diastolic (congestive) heart failure: Secondary | ICD-10-CM | POA: Diagnosis not present

## 2021-11-11 DIAGNOSIS — E1122 Type 2 diabetes mellitus with diabetic chronic kidney disease: Secondary | ICD-10-CM | POA: Diagnosis not present

## 2021-11-11 DIAGNOSIS — Z794 Long term (current) use of insulin: Secondary | ICD-10-CM | POA: Diagnosis not present

## 2021-11-11 DIAGNOSIS — J449 Chronic obstructive pulmonary disease, unspecified: Secondary | ICD-10-CM | POA: Diagnosis not present

## 2021-11-11 DIAGNOSIS — N1831 Chronic kidney disease, stage 3a: Secondary | ICD-10-CM | POA: Diagnosis not present

## 2021-11-11 DIAGNOSIS — I130 Hypertensive heart and chronic kidney disease with heart failure and stage 1 through stage 4 chronic kidney disease, or unspecified chronic kidney disease: Secondary | ICD-10-CM | POA: Diagnosis not present

## 2021-11-11 DIAGNOSIS — E1140 Type 2 diabetes mellitus with diabetic neuropathy, unspecified: Secondary | ICD-10-CM | POA: Diagnosis not present

## 2021-11-11 DIAGNOSIS — G894 Chronic pain syndrome: Secondary | ICD-10-CM | POA: Diagnosis not present

## 2021-11-14 ENCOUNTER — Other Ambulatory Visit: Payer: Self-pay

## 2021-11-14 DIAGNOSIS — J449 Chronic obstructive pulmonary disease, unspecified: Secondary | ICD-10-CM

## 2021-11-14 MED ORDER — NEBULIZER SYSTEM ALL-IN-ONE MISC: 1.0000 | 0 refills | Status: DC | PRN

## 2021-11-15 ENCOUNTER — Other Ambulatory Visit: Payer: Self-pay | Admitting: Nurse Practitioner

## 2021-11-15 ENCOUNTER — Other Ambulatory Visit: Payer: Self-pay | Admitting: Hematology

## 2021-11-15 DIAGNOSIS — E875 Hyperkalemia: Secondary | ICD-10-CM

## 2021-11-15 NOTE — Progress Notes (Deleted)
11/15/2021 [name] [id number] [birth year]  Referring provider: [name], NP Primary GI doctor: Dr. Carlean Purl  ASSESSMENT AND PLAN:   There are no diagnoses linked to this encounter.   Patient Care Team: Bonnita Hollow, MD as PCP - General (Family Medicine) [name], MD as PCP - Cardiology (Cardiology) [name], [name], MD (Inactive) as Consulting Physician (Cardiology) [name], MD as Consulting Physician (Ophthalmology) [name], MD as Consulting Physician (General Surgery) [name], MD (Inactive) as Referring Physician (Obstetrics and Gynecology) [name] [name], MD as Consulting Physician (Ophthalmology) [facility], [name], MD as Consulting Physician (Endocrinology) [name], MD as Consulting Physician (Hematology) [name], DO as Consulting Physician (Neurology)  HISTORY OF PRESENT ILLNESS: 87 y.o. female with a past medical history of morbid obesity, DM 2 with insulin use, chronic pain, history of breast cancer, IDA, CAD s/p DES stent 2012, and NSTEMI with DES 2021 to RCA, stage 3a kidney, history of PE/DVT, chronic diastolic heart failure, COPD with OSA and others listed below presents for evaluation of Ab issues.  11/2017 OV with Dr. Carlean Purl for IDA folowing bariatric surgery, and esophageal dysmotility.  Recent admission for dyspnea with acute on chronic heart failure.  Echo 02/23 EF 55-60%, no valvular disease.  10/22/2021 HGB 8.6, WBC 14  11/07/2016 EGD  04/21/14 CT AB and pelvis with contrast   Current Medications:   Current Outpatient Medications (Endocrine & Metabolic):    insulin glargine, 2 Unit Dial, (TOUJEO MAX SOLOSTAR) 300 UNIT/ML Solostar Pen, Inject 42 Units into the skin daily. Patient assistance program provides   NOVOLOG FLEXPEN 100 UNIT/ML FlexPen, Inject 16 Units into the skin 3 (three) times daily with meals.  Current Outpatient Medications (Cardiovascular):    atorvastatin  (LIPITOR) 80 MG tablet, TAKE 1 TABLET BY MOUTH EVERY DAY   carvedilol (COREG) 12.5 MG tablet, TAKE 1 TABLET BY MOUTH 2 TIMES DAILY   furosemide (LASIX) 40 MG tablet, Take 1 tablet (40 mg total) by mouth daily. May take extra 18m as needed for leg swelling. No more than 2 times per week.   isosorbide mononitrate (IMDUR) 60 MG 24 hr tablet, TAKE 1 TABLET BY MOUTH EVERY DAY   nitroGLYCERIN (NITROSTAT) 0.4 MG SL tablet, Take one tablet under the tongue every 5 minutes as needed for chest pain  Current Outpatient Medications (Respiratory):    albuterol (PROAIR HFA) 108 (90 Base) MCG/ACT inhaler, Inhale 1-2 puffs into the lungs every 6 (six) hours as needed for wheezing or shortness of breath.   budesonide-formoterol (SYMBICORT) 160-4.5 MCG/ACT inhaler, Inhale 2 puffs into the lungs 2 (two) times daily.   cetirizine (ZYRTEC) 10 MG tablet, Take 10 mg by mouth daily.   fluticasone (FLONASE) 50 MCG/ACT nasal spray, Place 1 spray into both nostrils in the morning and at bedtime.   guaiFENesin (MUCINEX) 600 MG 12 hr tablet, Take 1 tablet (600 mg total) by mouth 2 (two) times daily as needed.   ipratropium-albuterol (DUONEB) 0.5-2.5 (3) MG/3ML SOLN, Take 3 mLs by nebulization every 6 (six) hours as needed.  Current Outpatient Medications (Analgesics):    ASPIRIN LOW DOSE 81 MG EC tablet, TAKE 1 TABLET BY MOUTH EVERY DAY   febuxostat (ULORIC) 40 MG tablet, TAKE 1 TABLET BY MOUTH EVERY DAY   oxyCODONE-acetaminophen (PERCOCET) 7.5-325 MG tablet, Take 1 tablet by mouth every 12 (twelve) hours as needed for moderate pain or severe pain. Must last 30 days.  Current Outpatient Medications (Hematological):  clopidogrel (PLAVIX) 75 MG tablet, Take 1 tablet (75 mg total) by mouth daily.   folic acid (FOLVITE) 1 MG tablet, TAKE 1 TABLET BY MOUTH EVERY DAY  Current Outpatient Medications (Other):    anastrozole (ARIMIDEX) 1 MG tablet, TAKE 1 TABLET BY MOUTH EVERY DAY   Continuous Blood Gluc Sensor (FREESTYLE  LIBRE 14 DAY SENSOR) MISC, PLACE 1 DEVICE ON THE SKIN AS DIRECTED EVERY 14 DAYS   diclofenac Sodium (VOLTAREN) 1 % GEL, Apply 2 g topically 4 (four) times daily.   escitalopram (LEXAPRO) 10 MG tablet, Take 1 tablet (10 mg total) by mouth daily. Take 1/2 tablet in 2 weeks, then go to whole tablet   feeding supplement, GLUCERNA SHAKE, (GLUCERNA SHAKE) LIQD, Take 237 mLs by mouth 3 (three) times daily between meals.   hydrOXYzine (ATARAX) 10 MG tablet, Take 1 tablet (10 mg total) by mouth every 8 (eight) hours as needed for anxiety.   linaclotide (LINZESS) 145 MCG CAPS capsule, Take 1 capsule (145 mcg total) by mouth daily before breakfast. (Patient taking differently: Take 145 mcg by mouth daily as needed (for constipation).)   meclizine (ANTIVERT) 25 MG tablet, TAKE 1 TABLET BY MOUTH THREE TIMES DAILY AS NEEDED FOR DIZZINESS (Patient taking differently: Take 25 mg by mouth in the morning.)   memantine (NAMENDA) 5 MG tablet, TAKE 2 TABLETS BY MOUTH EVERY DAY and TAKE 1 TABLET BY MOUTH EVERY EVENING   Nebulizer System All-In-One MISC, 1 kit by Does not apply route as needed.   nystatin cream (MYCOSTATIN), Apply 1 application topically 2 (two) times daily.   pantoprazole (PROTONIX) 20 MG tablet, Take 1 tablet (20 mg total) by mouth daily.   polyethylene glycol powder (MIRALAX) 17 GM/SCOOP powder, 17 gm mix with liquid twice daily as needed   Respiratory Therapy Supplies (NEBULIZER/TUBING/MOUTHPIECE) KIT, 1 Units by Does not apply route as needed (for breathing treatments).   sodium polystyrene (SPS) 15 GM/60ML suspension, TAKE 34ms BY MOUTH ONCE A WEEK TO keep potassium down (Patient taking differently: Take 15 g by mouth every Sunday.)   Vitamin D, Ergocalciferol, (DRISDOL) 1.25 MG (50000 UNIT) CAPS capsule, TAKE 1 CAPSULE BY MOUTH every 7 days (Patient taking differently: Take 50,000 Units by mouth every Monday.)  Medical History:  Past Medical History:  Diagnosis Date   Abdominal pain 01/26/2012    Acute kidney injury superimposed on chronic kidney disease ([place] 12/18/2014   Allergy    takes Mucinex daily as needed   Anemia, unspecified    Anxiety    takes Clonazepam daily as needed   Anxiety associated with depression 06/08/2017   Arthritis    Breast cancer ([place]    Breast cancer screening 02/19/2014   Breast mass 10/27/2014   Chronic diastolic CHF (congestive heart failure) ([place]    takes Furosemide daily   Chronic pain syndrome 02/06/2015   CKD (chronic [place] disease), stage III ([place]    Coronary artery disease    a. s/p IMI 2004 tx with BMS to RCA;  b. s/p Promus DES to RCA 2/12 c. abnormal nuc 2016 -> cath with med rx. d. NSTEMI 02/2020  mv CAD with severe ISR in pRCA and m/dRCA lesion both treated with DES.   Debility 08/01/2012   Dementia ([place]    Depression    takes Cymbalta daily   Diabetes mellitus    insulin daily   DOE (dyspnea on exertion) 06/24/2014   Dysphagia, unspecified(787.20) 07/31/2012   Fungal dermatitis 11/13/2007   Qualifier: Diagnosis of  By:  Burnice Logan  MD, Doretha Sou    GERD (gastroesophageal reflux disease)    takes Protonix daily   Gout, unspecified    Headache    occasionally   History of blood clots    History of deep venous thrombosis 01/27/2012   History of pulmonary embolus (PE) 04/22/2014   Hyperkalemia 07/26/2017   Hyperlipidemia    takes Pravastatin daily   Hypertension    Insomnia    Joint pain    Joint swelling    Morbid obesity ([place])    Multiple benign nevi 11/15/2016   Myocardial infarction [facility]) 2004   Non-STEMI (non-ST elevated myocardial infarction) ([place]) 02/15/2020   Numbness    Obstructive sleep apnea    does not wear cpap   Osteoarthritis    Osteoarthritis    Osteoarthrosis, unspecified whether generalized or localized, lower leg    Pain in the chest 12/31/2013   Pain, chronic    Personal history of other diseases of the digestive system 12/03/2006   Qualifier: Diagnosis of  By: Burnice Logan  MD, Doretha Sou    Polymyalgia  rheumatica [facility])    Presence of stent in right coronary artery    Pulmonary emboli ([place]) 01/2012   felt to need lifelong anticoagulation but Xarelto dc in 02/2020 due to need for DAPT   Situational depression 06/04/2009   Qualifier: Diagnosis of  By: Burnice Logan  MD, Doretha Sou    Syncope, vasovagal 07/04/2021   Urinary incontinence    takes Linzess daily   Vaginitis and vulvovaginitis 06/23/2016   Vertigo    hx of;was taking Meclizine if needed   Wears dentures    Allergies:  Allergies  Allergen Reactions   Sulfonamide Derivatives Swelling and Other (See Comments)    Mouth swelling- no resp issues noted   Lokelma [Sodium Zirconium Cyclosilicate]     Headache, stomach upset   Aricept [Donepezil Hcl] Diarrhea and Nausea And Vomiting    GI upset/loose stools   Tramadol Nausea And Vomiting     Surgical History:  She  has a past surgical history that includes Appendectomy; Cardiac catheterization; Gastric bypass (4098 ); blood clots/legs and lungs (2013); MI with stent placement (2004); left heart catheterization with coronary angiogram (N/A, 06/29/2014); Coronary angioplasty (2); Eye surgery (Bilateral); Colonoscopy; Abdominal hysterectomy; Breast lumpectomy with radioactive seed localization (Left, 11/05/2014); Excision of skin tag (Right, 11/05/2014); Breast lumpectomy (Left, 11/05/2014); Breast biopsy (Left, 07/22/2014); Breast biopsy (Left, 02/10/2013); Esophagogastroduodenoscopy (egd) with propofol (N/A, 11/07/2016); Pars plana vitrectomy (Right, 10/23/2018); Membrane peel (Right, 10/23/2018); LEFT HEART CATH AND CORONARY ANGIOGRAPHY (N/A, 02/17/2020); and CORONARY STENT INTERVENTION (N/A, 02/17/2020). Family History:  Her family history includes Arthritis in her father and sister; Breast cancer (age of onset: 48) in her mother; Diabetes in her father and sister; Heart disease in her cousin and mother; Hypertension in her father; Obesity in her sister; Throat cancer in her father. Social History:    reports that she has never smoked. She has been exposed to tobacco smoke. She has never used smokeless tobacco. She reports that she does not drink alcohol and does not use drugs.  REVIEW OF SYSTEMS  : All other systems reviewed and negative except where noted in the History of Present Illness.   PHYSICAL EXAM: There were no vitals taken for this visit. General:   Pleasant, well developed female in no acute distress Head:   Normocephalic and atraumatic. Eyes:  {sclerae:26738},conjunctive {conjuctiva:26739}  Heart:   {HEART EXAM HEM/ONC:21750} Pulm:  Clear anteriorly; no wheezing Abdomen:   {  BlankSingle:19197::"Distended","Ridged","Soft"}, {BlankSingle:19197::"Flat","Obese","Non-distended"} AB, {BlankSingle:19197::"Absent","Hyperactive, tinkling","Hypoactive","Sluggish","Active"} bowel sounds. {actendernessAB:27319} tenderness {anatomy; site abdomen:5010}. {BlankMultiple:19196::"Without guarding","With guarding","Without rebound","With rebound"}, No organomegaly appreciated. Rectal: {acrectalexam:27461} Extremities:  {With/Without:304960234} edema. Msk: Symmetrical without gross deformities. Peripheral pulses intact.  Neurologic:  Alert and  oriented x4;  No focal deficits.  Skin:   Dry and intact without significant lesions or rashes. Psychiatric:  Cooperative. Normal mood and affect.    Vladimir Crofts, PA-C 9:02 AM

## 2021-11-16 ENCOUNTER — Encounter (HOSPITAL_COMMUNITY): Payer: Self-pay

## 2021-11-16 ENCOUNTER — Encounter: Payer: Self-pay | Admitting: Hematology

## 2021-11-18 ENCOUNTER — Encounter: Payer: Self-pay | Admitting: Hematology

## 2021-11-18 ENCOUNTER — Encounter (HOSPITAL_COMMUNITY): Payer: Self-pay

## 2021-11-18 ENCOUNTER — Telehealth: Payer: Self-pay

## 2021-11-18 NOTE — Telephone Encounter (Signed)
Advised [name] of annotation below and she verbalized understanding.

## 2021-11-18 NOTE — Telephone Encounter (Signed)
Nurse from [facility] called in regarding patient stopping cymbalta for the past week instead of weaning off of the medication as directed by Dr. Grandville Silos at 11/10/21 visit "Wean duloxetine take every other day for two weeks, then take every two days for 1 week, then take every 3 days for one week, then stop". Patient's daughter [name] is now asking if it's ok for patient to start escitalopram today "1/2 tablet once a day for once a day for two weeks, then go to whole tablet two weeks". Please advise in Dr. Biagio Borg absence.  Spoke with [name] and she states that patient is feeling fine, no mood changes and is eating normally.

## 2021-11-19 ENCOUNTER — Inpatient Hospital Stay (HOSPITAL_COMMUNITY)
Admission: EM | Admit: 2021-11-19 | Discharge: 2021-11-26 | DRG: 291 | Disposition: A | Discharge status: Home Health | Payer: PPO | Attending: Internal Medicine | Admitting: Internal Medicine

## 2021-11-19 ENCOUNTER — Encounter (HOSPITAL_COMMUNITY): Payer: Self-pay | Admitting: Emergency Medicine

## 2021-11-19 ENCOUNTER — Emergency Department (HOSPITAL_COMMUNITY): Payer: PPO

## 2021-11-19 DIAGNOSIS — I10 Essential (primary) hypertension: Secondary | ICD-10-CM | POA: Diagnosis present

## 2021-11-19 DIAGNOSIS — Z833 Family history of diabetes mellitus: Secondary | ICD-10-CM

## 2021-11-19 DIAGNOSIS — J4489 Other specified chronic obstructive pulmonary disease: Secondary | ICD-10-CM | POA: Diagnosis present

## 2021-11-19 DIAGNOSIS — E1169 Type 2 diabetes mellitus with other specified complication: Secondary | ICD-10-CM | POA: Diagnosis present

## 2021-11-19 DIAGNOSIS — I5033 Acute on chronic diastolic (congestive) heart failure: Secondary | ICD-10-CM | POA: Diagnosis present

## 2021-11-19 DIAGNOSIS — E8809 Other disorders of plasma-protein metabolism, not elsewhere classified: Secondary | ICD-10-CM | POA: Diagnosis present

## 2021-11-19 DIAGNOSIS — D509 Iron deficiency anemia, unspecified: Secondary | ICD-10-CM | POA: Diagnosis present

## 2021-11-19 DIAGNOSIS — Z7951 Long term (current) use of inhaled steroids: Secondary | ICD-10-CM

## 2021-11-19 DIAGNOSIS — E1165 Type 2 diabetes mellitus with hyperglycemia: Secondary | ICD-10-CM | POA: Diagnosis present

## 2021-11-19 DIAGNOSIS — E785 Hyperlipidemia, unspecified: Secondary | ICD-10-CM | POA: Diagnosis present

## 2021-11-19 DIAGNOSIS — F419 Anxiety disorder, unspecified: Secondary | ICD-10-CM | POA: Diagnosis present

## 2021-11-19 DIAGNOSIS — I959 Hypotension, unspecified: Secondary | ICD-10-CM | POA: Diagnosis not present

## 2021-11-19 DIAGNOSIS — R32 Unspecified urinary incontinence: Secondary | ICD-10-CM | POA: Diagnosis present

## 2021-11-19 DIAGNOSIS — E1140 Type 2 diabetes mellitus with diabetic neuropathy, unspecified: Secondary | ICD-10-CM | POA: Diagnosis present

## 2021-11-19 DIAGNOSIS — N179 Acute kidney failure, unspecified: Secondary | ICD-10-CM | POA: Diagnosis present

## 2021-11-19 DIAGNOSIS — Z79899 Other long term (current) drug therapy: Secondary | ICD-10-CM

## 2021-11-19 DIAGNOSIS — Z993 Dependence on wheelchair: Secondary | ICD-10-CM

## 2021-11-19 DIAGNOSIS — F0393 Unspecified dementia, unspecified severity, with mood disturbance: Secondary | ICD-10-CM | POA: Diagnosis present

## 2021-11-19 DIAGNOSIS — R051 Acute cough: Secondary | ICD-10-CM

## 2021-11-19 DIAGNOSIS — R079 Chest pain, unspecified: Secondary | ICD-10-CM | POA: Diagnosis not present

## 2021-11-19 DIAGNOSIS — Z7902 Long term (current) use of antithrombotics/antiplatelets: Secondary | ICD-10-CM

## 2021-11-19 DIAGNOSIS — R0789 Other chest pain: Secondary | ICD-10-CM | POA: Diagnosis not present

## 2021-11-19 DIAGNOSIS — E66813 Obesity, class 3: Secondary | ICD-10-CM | POA: Diagnosis present

## 2021-11-19 DIAGNOSIS — J810 Acute pulmonary edema: Principal | ICD-10-CM

## 2021-11-19 DIAGNOSIS — N1832 Chronic kidney disease, stage 3b: Secondary | ICD-10-CM | POA: Diagnosis present

## 2021-11-19 DIAGNOSIS — F0390 Unspecified dementia without behavioral disturbance: Secondary | ICD-10-CM | POA: Diagnosis not present

## 2021-11-19 DIAGNOSIS — D631 Anemia in chronic kidney disease: Secondary | ICD-10-CM | POA: Diagnosis present

## 2021-11-19 DIAGNOSIS — Z955 Presence of coronary angioplasty implant and graft: Secondary | ICD-10-CM

## 2021-11-19 DIAGNOSIS — F332 Major depressive disorder, recurrent severe without psychotic features: Secondary | ICD-10-CM | POA: Diagnosis present

## 2021-11-19 DIAGNOSIS — G4700 Insomnia, unspecified: Secondary | ICD-10-CM | POA: Diagnosis present

## 2021-11-19 DIAGNOSIS — G894 Chronic pain syndrome: Secondary | ICD-10-CM | POA: Diagnosis present

## 2021-11-19 DIAGNOSIS — Z79811 Long term (current) use of aromatase inhibitors: Secondary | ICD-10-CM

## 2021-11-19 DIAGNOSIS — Z853 Personal history of malignant neoplasm of breast: Secondary | ICD-10-CM

## 2021-11-19 DIAGNOSIS — M1A9XX1 Chronic gout, unspecified, with tophus (tophi): Secondary | ICD-10-CM | POA: Diagnosis not present

## 2021-11-19 DIAGNOSIS — N289 Disorder of kidney and ureter, unspecified: Secondary | ICD-10-CM | POA: Diagnosis not present

## 2021-11-19 DIAGNOSIS — F03918 Unspecified dementia, unspecified severity, with other behavioral disturbance: Secondary | ICD-10-CM | POA: Diagnosis present

## 2021-11-19 DIAGNOSIS — C50919 Malignant neoplasm of unspecified site of unspecified female breast: Secondary | ICD-10-CM | POA: Diagnosis present

## 2021-11-19 DIAGNOSIS — R42 Dizziness and giddiness: Secondary | ICD-10-CM | POA: Diagnosis not present

## 2021-11-19 DIAGNOSIS — Z6841 Body Mass Index (BMI) 40.0 and over, adult: Secondary | ICD-10-CM

## 2021-11-19 DIAGNOSIS — I444 Left anterior fascicular block: Secondary | ICD-10-CM | POA: Diagnosis present

## 2021-11-19 DIAGNOSIS — I509 Heart failure, unspecified: Secondary | ICD-10-CM

## 2021-11-19 DIAGNOSIS — G4733 Obstructive sleep apnea (adult) (pediatric): Secondary | ICD-10-CM | POA: Diagnosis present

## 2021-11-19 DIAGNOSIS — Z8249 Family history of ischemic heart disease and other diseases of the circulatory system: Secondary | ICD-10-CM

## 2021-11-19 DIAGNOSIS — E875 Hyperkalemia: Secondary | ICD-10-CM | POA: Diagnosis present

## 2021-11-19 DIAGNOSIS — Z86711 Personal history of pulmonary embolism: Secondary | ICD-10-CM

## 2021-11-19 DIAGNOSIS — I130 Hypertensive heart and chronic kidney disease with heart failure and stage 1 through stage 4 chronic kidney disease, or unspecified chronic kidney disease: Secondary | ICD-10-CM | POA: Diagnosis present

## 2021-11-19 DIAGNOSIS — I25118 Atherosclerotic heart disease of native coronary artery with other forms of angina pectoris: Secondary | ICD-10-CM | POA: Diagnosis present

## 2021-11-19 DIAGNOSIS — I252 Old myocardial infarction: Secondary | ICD-10-CM

## 2021-11-19 DIAGNOSIS — R682 Dry mouth, unspecified: Secondary | ICD-10-CM | POA: Diagnosis present

## 2021-11-19 DIAGNOSIS — F0394 Unspecified dementia, unspecified severity, with anxiety: Secondary | ICD-10-CM | POA: Diagnosis present

## 2021-11-19 DIAGNOSIS — M109 Gout, unspecified: Secondary | ICD-10-CM | POA: Diagnosis present

## 2021-11-19 DIAGNOSIS — E1122 Type 2 diabetes mellitus with diabetic chronic kidney disease: Secondary | ICD-10-CM | POA: Diagnosis present

## 2021-11-19 DIAGNOSIS — J449 Chronic obstructive pulmonary disease, unspecified: Secondary | ICD-10-CM | POA: Diagnosis present

## 2021-11-19 DIAGNOSIS — Z90710 Acquired absence of both cervix and uterus: Secondary | ICD-10-CM

## 2021-11-19 DIAGNOSIS — Z882 Allergy status to sulfonamides status: Secondary | ICD-10-CM

## 2021-11-19 DIAGNOSIS — Z888 Allergy status to other drugs, medicaments and biological substances status: Secondary | ICD-10-CM

## 2021-11-19 DIAGNOSIS — Z66 Do not resuscitate: Secondary | ICD-10-CM | POA: Diagnosis present

## 2021-11-19 DIAGNOSIS — Z7982 Long term (current) use of aspirin: Secondary | ICD-10-CM

## 2021-11-19 DIAGNOSIS — Z885 Allergy status to narcotic agent status: Secondary | ICD-10-CM

## 2021-11-19 DIAGNOSIS — Z794 Long term (current) use of insulin: Secondary | ICD-10-CM

## 2021-11-19 DIAGNOSIS — D649 Anemia, unspecified: Secondary | ICD-10-CM | POA: Diagnosis present

## 2021-11-19 DIAGNOSIS — Z8261 Family history of arthritis: Secondary | ICD-10-CM

## 2021-11-19 DIAGNOSIS — Z803 Family history of malignant neoplasm of breast: Secondary | ICD-10-CM

## 2021-11-19 DIAGNOSIS — M1990 Unspecified osteoarthritis, unspecified site: Secondary | ICD-10-CM | POA: Diagnosis present

## 2021-11-19 DIAGNOSIS — K2100 Gastro-esophageal reflux disease with esophagitis, without bleeding: Secondary | ICD-10-CM | POA: Diagnosis present

## 2021-11-19 DIAGNOSIS — Z86718 Personal history of other venous thrombosis and embolism: Secondary | ICD-10-CM

## 2021-11-19 LAB — CBC WITH DIFFERENTIAL/PLATELET
Abs Immature Granulocytes: 0.04 10*3/uL (ref 0.00–0.07)
Basophils Absolute: 0 10*3/uL (ref 0.0–0.1)
Basophils Relative: 0 %
Eosinophils Absolute: 0.4 10*3/uL (ref 0.0–0.5)
Eosinophils Relative: 4 %
HCT: 30.4 % — ABNORMAL LOW (ref 36.0–46.0)
Hemoglobin: 9.3 g/dL — ABNORMAL LOW (ref 12.0–15.0)
Immature Granulocytes: 0 %
Lymphocytes Relative: 25 %
Lymphs Abs: 2.6 10*3/uL (ref 0.7–4.0)
MCH: 29.1 pg (ref 26.0–34.0)
MCHC: 30.6 g/dL (ref 30.0–36.0)
MCV: 95 fL (ref 80.0–100.0)
Monocytes Absolute: 0.7 10*3/uL (ref 0.1–1.0)
Monocytes Relative: 7 %
Neutro Abs: 6.7 10*3/uL (ref 1.7–7.7)
Neutrophils Relative %: 64 %
Platelets: 225 10*3/uL (ref 150–400)
RBC: 3.2 MIL/uL — ABNORMAL LOW (ref 3.87–5.11)
RDW: 14 % (ref 11.5–15.5)
WBC: 10.4 10*3/uL (ref 4.0–10.5)
nRBC: 0 % (ref 0.0–0.2)

## 2021-11-19 LAB — COMPREHENSIVE METABOLIC PANEL
ALT: 12 U/L (ref 0–44)
AST: 19 U/L (ref 15–41)
Albumin: 2.7 g/dL — ABNORMAL LOW (ref 3.5–5.0)
Alkaline Phosphatase: 82 U/L (ref 38–126)
Anion gap: 15 (ref 5–15)
BUN: 45 mg/dL — ABNORMAL HIGH (ref 8–23)
CO2: 23 mmol/L (ref 22–32)
Calcium: 8.4 mg/dL — ABNORMAL LOW (ref 8.9–10.3)
Chloride: 103 mmol/L (ref 98–111)
Creatinine, Ser: 2.04 mg/dL — ABNORMAL HIGH (ref 0.44–1.00)
GFR, Estimated: 23 mL/min — ABNORMAL LOW (ref >60)
Glucose, Bld: 242 mg/dL — ABNORMAL HIGH (ref 70–99)
Potassium: 4.2 mmol/L (ref 3.5–5.1)
Sodium: 141 mmol/L (ref 135–145)
Total Bilirubin: 0.3 mg/dL (ref 0.3–1.2)
Total Protein: 7 g/dL (ref 6.5–8.1)

## 2021-11-19 LAB — BRAIN NATRIURETIC PEPTIDE: B Natriuretic Peptide: 43.5 pg/mL (ref 0.0–100.0)

## 2021-11-19 LAB — LIPASE, BLOOD: Lipase: 30 U/L (ref 11–51)

## 2021-11-19 LAB — TROPONIN I (HIGH SENSITIVITY)
Troponin I (High Sensitivity): 13 ng/L (ref <18)
Troponin I (High Sensitivity): 13 ng/L (ref <18)

## 2021-11-19 MED ORDER — ISOSORBIDE MONONITRATE ER 60 MG PO TB24
60.0000 mg | ORAL_TABLET | Freq: Every day | ORAL | Status: DC
Start: 2021-11-20 — End: 2021-11-23
  Administered 2021-11-20 – 2021-11-22 (×3): 60 mg via ORAL
  Filled 2021-11-19 (×3): qty 1

## 2021-11-19 MED ORDER — MEMANTINE HCL 10 MG PO TABS
5.0000 mg | ORAL_TABLET | Freq: Every day | ORAL | Status: DC
Start: 2021-11-20 — End: 2021-11-26
  Administered 2021-11-20 – 2021-11-25 (×6): 5 mg via ORAL
  Filled 2021-11-19 (×7): qty 1

## 2021-11-19 MED ORDER — HYDROXYZINE HCL 10 MG PO TABS
10.0000 mg | ORAL_TABLET | Freq: Three times a day (TID) | ORAL | Status: DC | PRN
  Administered 2021-11-20 – 2021-11-25 (×6): 10 mg via ORAL
  Filled 2021-11-19 (×7): qty 1

## 2021-11-19 MED ORDER — POLYETHYLENE GLYCOL 3350 17 G PO PACK
17.0000 g | PACK | Freq: Every day | ORAL | Status: DC | PRN
  Administered 2021-11-23: 17 g via ORAL
  Filled 2021-11-19: qty 1

## 2021-11-19 MED ORDER — ANASTROZOLE 1 MG PO TABS
1.0000 mg | ORAL_TABLET | Freq: Every day | ORAL | Status: DC
  Administered 2021-11-20 – 2021-11-26 (×7): 1 mg via ORAL
  Filled 2021-11-19 (×7): qty 1

## 2021-11-19 MED ORDER — CARVEDILOL 12.5 MG PO TABS
12.5000 mg | ORAL_TABLET | Freq: Two times a day (BID) | ORAL | Status: DC
  Administered 2021-11-20 – 2021-11-26 (×12): 12.5 mg via ORAL
  Filled 2021-11-19 (×13): qty 1

## 2021-11-19 MED ORDER — MEMANTINE HCL 10 MG PO TABS
10.0000 mg | ORAL_TABLET | Freq: Every day | ORAL | Status: DC
  Administered 2021-11-20 – 2021-11-26 (×7): 10 mg via ORAL
  Filled 2021-11-19 (×7): qty 1

## 2021-11-19 MED ORDER — ASPIRIN 81 MG PO TBEC
81.0000 mg | DELAYED_RELEASE_TABLET | Freq: Every day | ORAL | Status: DC
  Administered 2021-11-20 – 2021-11-26 (×7): 81 mg via ORAL
  Filled 2021-11-19 (×7): qty 1

## 2021-11-19 MED ORDER — ALBUTEROL SULFATE HFA 108 (90 BASE) MCG/ACT IN AERS: 1.0000 | INHALATION_SPRAY | Freq: Four times a day (QID) | RESPIRATORY_TRACT | Status: DC | PRN

## 2021-11-19 MED ORDER — FUROSEMIDE 10 MG/ML IJ SOLN
40.0000 mg | Freq: Once | INTRAMUSCULAR | Status: AC
  Administered 2021-11-19: 40 mg via INTRAVENOUS
  Filled 2021-11-19: qty 4

## 2021-11-19 MED ORDER — SODIUM CHLORIDE 0.9% FLUSH
3.0000 mL | Freq: Two times a day (BID) | INTRAVENOUS | Status: DC
  Administered 2021-11-20 – 2021-11-26 (×11): 3 mL via INTRAVENOUS

## 2021-11-19 MED ORDER — PANTOPRAZOLE SODIUM 20 MG PO TBEC
20.0000 mg | DELAYED_RELEASE_TABLET | Freq: Every day | ORAL | Status: DC
  Administered 2021-11-20 – 2021-11-26 (×7): 20 mg via ORAL
  Filled 2021-11-19 (×7): qty 1

## 2021-11-19 MED ORDER — FUROSEMIDE 20 MG PO TABS: 40.0000 mg | ORAL_TABLET | Freq: Once | ORAL | Status: DC

## 2021-11-19 MED ORDER — ENOXAPARIN SODIUM 30 MG/0.3ML IJ SOSY
30.0000 mg | PREFILLED_SYRINGE | INTRAMUSCULAR | Status: DC
  Administered 2021-11-20: 30 mg via SUBCUTANEOUS
  Filled 2021-11-19: qty 0.3

## 2021-11-19 MED ORDER — ATORVASTATIN CALCIUM 80 MG PO TABS
80.0000 mg | ORAL_TABLET | Freq: Every day | ORAL | Status: DC
  Administered 2021-11-20 – 2021-11-26 (×7): 80 mg via ORAL
  Filled 2021-11-19 (×7): qty 1

## 2021-11-19 MED ORDER — ACETAMINOPHEN 325 MG PO TABS
650.0000 mg | ORAL_TABLET | Freq: Four times a day (QID) | ORAL | Status: DC | PRN
  Administered 2021-11-20: 650 mg via ORAL
  Filled 2021-11-19: qty 2

## 2021-11-19 MED ORDER — LINACLOTIDE 145 MCG PO CAPS: 145.0000 ug | ORAL_CAPSULE | Freq: Every day | ORAL | Status: DC | PRN

## 2021-11-19 MED ORDER — ESCITALOPRAM OXALATE 10 MG PO TABS
10.0000 mg | ORAL_TABLET | Freq: Every day | ORAL | Status: DC
  Administered 2021-11-20 – 2021-11-26 (×7): 10 mg via ORAL
  Filled 2021-11-19 (×7): qty 1

## 2021-11-19 MED ORDER — FUROSEMIDE 10 MG/ML IJ SOLN
40.0000 mg | Freq: Two times a day (BID) | INTRAMUSCULAR | Status: DC
  Administered 2021-11-20 – 2021-11-21 (×3): 40 mg via INTRAVENOUS
  Filled 2021-11-19 (×3): qty 4

## 2021-11-19 MED ORDER — FEBUXOSTAT 40 MG PO TABS
40.0000 mg | ORAL_TABLET | Freq: Every day | ORAL | Status: DC
  Administered 2021-11-20 – 2021-11-26 (×7): 40 mg via ORAL
  Filled 2021-11-19 (×7): qty 1

## 2021-11-19 MED ORDER — GLUCERNA SHAKE PO LIQD
237.0000 mL | Freq: Three times a day (TID) | ORAL | Status: DC
  Administered 2021-11-20 – 2021-11-26 (×10): 237 mL via ORAL
  Filled 2021-11-19: qty 237

## 2021-11-19 MED ORDER — ACETAMINOPHEN 650 MG RE SUPP: 650.0000 mg | Freq: Four times a day (QID) | RECTAL | Status: DC | PRN

## 2021-11-19 MED ORDER — MEMANTINE HCL 10 MG PO TABS: 5.0000 mg | ORAL_TABLET | Freq: Every day | ORAL | Status: DC

## 2021-11-19 MED ORDER — MOMETASONE FURO-FORMOTEROL FUM 200-5 MCG/ACT IN AERO
2.0000 | INHALATION_SPRAY | Freq: Two times a day (BID) | RESPIRATORY_TRACT | Status: DC
  Administered 2021-11-20 – 2021-11-26 (×11): 2 via RESPIRATORY_TRACT
  Filled 2021-11-19 (×2): qty 8.8

## 2021-11-19 MED ORDER — CLOPIDOGREL BISULFATE 75 MG PO TABS
75.0000 mg | ORAL_TABLET | Freq: Every day | ORAL | Status: DC
  Administered 2021-11-20 – 2021-11-26 (×7): 75 mg via ORAL
  Filled 2021-11-19 (×7): qty 1

## 2021-11-19 NOTE — Discharge Instructions (Addendum)
Take an extra dose of your Lasix the next 2 days in addition to your normal doses and follow-up with your primary doctor or the heart doctor for reassessment.  If your chest pain returns or you develop worsening shortness of breath make sure you return the emergency room or call the ambulance.

## 2021-11-19 NOTE — H&P (Incomplete)
History and Physical   AROHI SALVATIERRA ZCH:[id number] DOB: [birth year] DOA: 11/19/2021  PCP: Bonnita Hollow, MD   Patient coming from: Home  Chief Complaint: Chest pain/epigastric pain, dyspnea on exertion.  HPI: [name] is a 87 y.o. female with medical history significant of hypertension, hyperlipidemia, diastolic heart failure, depression, anxiety, insomnia, gout, CAD status post stent, COPD, OSA, pulmonary embolus, diabetes, CKD, obesity, anemia, GERD, dementia, chronic pain, breast cancer presenting with shortness of breath and chest pressure with associated nausea and weakness.  Patient reports 2 days of increased dyspnea on exertion and some chest pressure.  Also has had some associated nausea without vomiting and generalized weakness.  She is chronically wheelchair-bound and her shortness of breath comes with exerting herself with moving her wheelchair.  Received aspirin in route via EMS.  She denies fevers, chills, abdominal pain, constipation, diarrhea.***  ED Course: Vital signs in ED seen for blood pressure in the 287O to 676H systolic.  Lab work-up included CMP with BUN of 45 and creatinine of 2.04 up from baseline 1.5, glucose 242, calcium 8.4, albumin 2.7.  CBC with hemoglobin stable at 9.3.  Troponin negative x2.  Lipase normal.  BNP normal however this could be falsely low in the setting of obesity.  Chest x-ray showed increased density of the left upper lobe consistent with artifact versus edema versus pneumonia.  Patient received dose of Lasix in the ED.  Review of Systems: As per HPI otherwise all other systems reviewed and are negative.  Past Medical History:  Diagnosis Date   Abdominal pain 01/26/2012   Acute kidney injury superimposed on chronic kidney disease ([name]) 12/18/2014   Allergy    takes Mucinex daily as needed   Anemia, unspecified    Anxiety    takes Clonazepam daily as needed   Anxiety associated with depression 06/08/2017   Arthritis    Breast  cancer ([place])    Breast cancer screening 02/19/2014   Breast mass 10/27/2014   Chronic diastolic CHF (congestive heart failure) ([place])    takes Furosemide daily   Chronic pain syndrome 02/06/2015   CKD (chronic kidney disease), stage III ([place])    Coronary artery disease    a. s/p IMI 2004 tx with BMS to RCA;  b. s/p Promus DES to RCA 2/12 c. abnormal nuc 2016 -> cath with med rx. d. NSTEMI 02/2020  mv CAD with severe ISR in pRCA and m/dRCA lesion both treated with DES.   Debility 08/01/2012   Dementia ([place])    Depression    takes Cymbalta daily   Diabetes mellitus    insulin daily   DOE (dyspnea on exertion) 06/24/2014   Dysphagia, unspecified(787.20) 07/31/2012   Fungal dermatitis 11/13/2007   Qualifier: Diagnosis of  By: Burnice Logan  MD, Doretha Sou    GERD (gastroesophageal reflux disease)    takes Protonix daily   Gout, unspecified    Headache    occasionally   History of blood clots    History of deep venous thrombosis 01/27/2012   History of pulmonary embolus (PE) 04/22/2014   Hyperkalemia 07/26/2017   Hyperlipidemia    takes Pravastatin daily   Hypertension    Insomnia    Joint pain    Joint swelling    Morbid obesity ([place])    Multiple benign nevi 11/15/2016   Myocardial infarction ([place]) 2004   Non-STEMI (non-ST elevated myocardial infarction) ([place]) 02/15/2020   Numbness    Obstructive sleep apnea    does not wear cpap  Osteoarthritis    Osteoarthritis    Osteoarthrosis, unspecified whether generalized or localized, lower leg    Pain in the chest 12/31/2013   Pain, chronic    Personal history of other diseases of the digestive system 12/03/2006   Qualifier: Diagnosis of  By: Burnice Logan  MD, Doretha Sou    Polymyalgia rheumatica [facility])    Presence of stent in right coronary artery    Pulmonary emboli ([place]) 01/2012   felt to need lifelong anticoagulation but Xarelto dc in 02/2020 due to need for DAPT   Situational depression 06/04/2009   Qualifier: Diagnosis of  By: Burnice Logan   MD, Doretha Sou    Syncope, vasovagal 07/04/2021   Urinary incontinence    takes Linzess daily   Vaginitis and vulvovaginitis 06/23/2016   Vertigo    hx of;was taking Meclizine if needed   Wears dentures     Past Surgical History:  Procedure Laterality Date   ABDOMINAL HYSTERECTOMY     partial   APPENDECTOMY     blood clots/legs and lungs  2013   BREAST BIOPSY Left 07/22/2014   BREAST BIOPSY Left 02/10/2013   BREAST LUMPECTOMY Left 11/05/2014   BREAST LUMPECTOMY WITH RADIOACTIVE SEED LOCALIZATION Left 11/05/2014   Procedure: LEFT BREAST LUMPECTOMY WITH RADIOACTIVE SEED LOCALIZATION;  Surgeon: [name], MD;  Location: [place];  Service: General;  Laterality: Left;   CARDIAC CATHETERIZATION     COLONOSCOPY     CORONARY ANGIOPLASTY  2   CORONARY STENT INTERVENTION N/A 02/17/2020   Procedure: CORONARY STENT INTERVENTION;  Surgeon: Leonie Man, MD;  Location: [place] [facility];  Service: Cardiovascular;  Laterality: N/A;   ESOPHAGOGASTRODUODENOSCOPY (EGD) WITH PROPOFOL N/A 11/07/2016   Procedure: ESOPHAGOGASTRODUODENOSCOPY (EGD) WITH PROPOFOL;  Surgeon: Gatha Mayer, MD;  Location: [facility];  Service: Endoscopy;  Laterality: N/A;   EXCISION OF SKIN TAG Right 11/05/2014   Procedure: EXCISION OF RIGHT EYELID SKIN TAG;  Surgeon: [name], MD;  Location: [place];  Service: General;  Laterality: Right;   EYE SURGERY Bilateral    cataract    GASTRIC BYPASS  1977    reversed in 1979, [place] CATH AND CORONARY ANGIOGRAPHY N/A 02/17/2020   Procedure: LEFT HEART CATH AND CORONARY ANGIOGRAPHY;  Surgeon: Leonie Man, MD;  Location: [place] [facility];  Service: Cardiovascular;  Laterality: N/A;   LEFT HEART CATHETERIZATION WITH CORONARY ANGIOGRAM N/A 06/29/2014   Procedure: LEFT HEART CATHETERIZATION WITH CORONARY ANGIOGRAM;  Surgeon: Troy Sine, MD;  Location: [facility] CATH LAB;  Service: Cardiovascular;  Laterality: N/A;   MEMBRANE PEEL Right 10/23/2018    Procedure: MEMBRANE PEEL;  Surgeon: Hurman Horn, MD;  Location: [place];  Service: Ophthalmology;  Laterality: Right;   MI with stent placement  2004   PARS PLANA VITRECTOMY Right 10/23/2018   Procedure: PARS PLANA VITRECTOMY WITH 25 GAUGE;  Surgeon: Hurman Horn, MD;  Location: [place];  Service: Ophthalmology;  Laterality: Right;    Social History  reports that she has never smoked. She has been exposed to tobacco smoke. She has never used smokeless tobacco. She reports that she does not drink alcohol and does not use drugs.  Allergies  Allergen Reactions   Sulfonamide Derivatives Swelling and Other (See Comments)    Mouth swelling- no resp issues noted   Lokelma [Sodium Zirconium Cyclosilicate]     Headache, stomach upset   Aricept [Donepezil Hcl] Diarrhea and Nausea And Vomiting    GI upset/loose  stools   Tramadol Nausea And Vomiting    Family History  Problem Relation Age of Onset   Breast cancer Mother 32   Heart disease Mother    Throat cancer Father    Hypertension Father    Arthritis Father    Diabetes Father    Arthritis Sister    Obesity Sister    Diabetes Sister    Heart disease Cousin    Colon cancer Neg Hx    Stomach cancer Neg Hx    Esophageal cancer Neg Hx   Reviewed on admission  Prior to Admission medications   Medication Sig Start Date End Date Taking? Authorizing Provider  albuterol (PROAIR HFA) 108 (90 Base) MCG/ACT inhaler Inhale 1-2 puffs into the lungs every 6 (six) hours as needed for wheezing or shortness of breath. 12/19/18   [name], NP  anastrozole (ARIMIDEX) 1 MG tablet TAKE 1 TABLET BY MOUTH EVERY DAY 11/15/21   [name], MD  ASPIRIN LOW DOSE 81 MG EC tablet TAKE 1 TABLET BY MOUTH EVERY DAY 04/21/21   [name], NP  atorvastatin (LIPITOR) 80 MG tablet TAKE 1 TABLET BY MOUTH EVERY DAY 02/07/21   [name], NP  budesonide-formoterol (SYMBICORT) 160-4.5 MCG/ACT inhaler Inhale 2 puffs into the lungs 2 (two)  times daily. 01/26/21   [name], NP  carvedilol (COREG) 12.5 MG tablet TAKE 1 TABLET BY MOUTH 2 TIMES DAILY 08/01/21   [name], NP  cetirizine (ZYRTEC) 10 MG tablet Take 10 mg by mouth daily.    [provider]  clopidogrel (PLAVIX) 75 MG tablet Take 1 tablet (75 mg total) by mouth daily. 05/26/21   Freada Bergeron, MD  Continuous Blood Gluc Sensor (FREESTYLE LIBRE 14 DAY SENSOR) MISC PLACE 1 DEVICE ON THE SKIN AS DIRECTED EVERY 14 DAYS 11/10/21   Shamleffer, [name], MD  diclofenac Sodium (VOLTAREN) 1 % GEL Apply 2 g topically 4 (four) times daily. 09/05/21   [name], NP  escitalopram (LEXAPRO) 10 MG tablet Take 1 tablet (10 mg total) by mouth daily. Take 1/2 tablet in 2 weeks, then go to whole tablet 11/10/21 12/10/21  Bonnita Hollow, MD  febuxostat (ULORIC) 40 MG tablet TAKE 1 TABLET BY MOUTH EVERY DAY 10/12/21   [name], NP  feeding supplement, GLUCERNA SHAKE, (GLUCERNA SHAKE) LIQD Take 237 mLs by mouth 3 (three) times daily between meals. 10/22/21 11/21/21  Arrien, Jimmy Picket, MD  fluticasone (FLONASE) 50 MCG/ACT nasal spray Place 1 spray into both nostrils in the morning and at bedtime. 12/17/20   Ngetich, Dinah C, NP  folic acid (FOLVITE) 1 MG tablet TAKE 1 TABLET BY MOUTH EVERY DAY 08/01/21   [name], NP  furosemide (LASIX) 20 MG tablet Take 20 mg by mouth daily as needed. 10/24/21   [provider]  furosemide (LASIX) 40 MG tablet Take 1 tablet (40 mg total) by mouth daily. May take extra 72m as needed for leg swelling. No more than 2 times per week. 10/25/21 11/24/21  Clegg, Amy D, NP  guaiFENesin (MUCINEX) 600 MG 12 hr tablet Take 1 tablet (600 mg total) by mouth 2 (two) times daily as needed. 03/14/21   Medina-Vargas, Monina C, NP  hydrOXYzine (ATARAX) 10 MG tablet Take 1 tablet (10 mg total) by mouth every 8 (eight) hours as needed for anxiety. 10/22/21   Arrien, MJimmy Picket MD  insulin glargine, 2 Unit Dial,  (TOUJEO MAX SOLOSTAR) 300 UNIT/ML Solostar  Pen Inject 42 Units into the skin daily. Patient assistance program provides 05/24/21   Shamleffer, [name], MD  ipratropium-albuterol (DUONEB) 0.5-2.5 (3) MG/3ML SOLN Take 3 mLs by nebulization every 6 (six) hours as needed. 10/27/21   Bonnita Hollow, MD  isosorbide mononitrate (IMDUR) 60 MG 24 hr tablet TAKE 1 TABLET BY MOUTH EVERY DAY 10/12/21   [name], NP  linaclotide (LINZESS) 145 MCG CAPS capsule Take 1 capsule (145 mcg total) by mouth daily before breakfast. Patient taking differently: Take 145 mcg by mouth daily as needed (for constipation). 09/05/21   [name], NP  meclizine (ANTIVERT) 25 MG tablet TAKE 1 TABLET BY MOUTH THREE TIMES DAILY AS NEEDED FOR DIZZINESS Patient taking differently: Take 25 mg by mouth in the morning. 10/11/21   [name], NP  memantine (NAMENDA) 5 MG tablet TAKE 2 TABLETS BY MOUTH EVERY DAY and TAKE 1 TABLET BY MOUTH EVERY EVENING 09/26/21   [name], NP  Nebulizer System All-In-One MISC 1 kit by Does not apply route as needed. 11/14/21   Bonnita Hollow, MD  nitroGLYCERIN (NITROSTAT) 0.4 MG SL tablet Take one tablet under the tongue every 5 minutes as needed for chest pain 09/24/13   [name], NP  NOVOLOG FLEXPEN 100 UNIT/ML FlexPen Inject 16 Units into the skin 3 (three) times daily with meals.    [provider]  nystatin cream (MYCOSTATIN) Apply 1 application topically 2 (two) times daily. 12/17/20   Ngetich, Dinah C, NP  oxyCODONE-acetaminophen (PERCOCET) 7.5-325 MG tablet Take 1 tablet by mouth every 12 (twelve) hours as needed for moderate pain or severe pain. Must last 30 days. 11/11/21 12/11/21  Gillis Santa, MD  pantoprazole (PROTONIX) 20 MG tablet Take 1 tablet (20 mg total) by mouth daily. 07/11/21   [name], NP  polyethylene glycol powder (MIRALAX) 17 GM/SCOOP powder 17 gm mix with liquid twice daily as needed 09/05/21   [name], NP   Respiratory Therapy Supplies (NEBULIZER/TUBING/MOUTHPIECE) KIT 1 Units by Does not apply route as needed (for breathing treatments). 10/27/21   Bonnita Hollow, MD  sodium polystyrene (SPS) 15 GM/60ML suspension take 7ms by MOUTH ONCE WEEKLY. TO keep potassium down 11/15/21   ELauree Chandler NP  Vitamin D, Ergocalciferol, (DRISDOL) 1.25 MG (50000 UNIT) CAPS capsule TAKE 1 CAPSULE BY MOUTH every 7 days Patient taking differently: Take 50,000 Units by mouth every Monday. 10/12/21   KBrunetta Genera MD    Physical Exam: Vitals:   11/19/21 1730 11/19/21 1900 11/19/21 2100 11/19/21 2200  BP: (!) 131/56 (!) 161/74 (!) 163/79 (!) 150/85  Pulse: 72 73 71 73  Resp: '18 18 19 17  ' Temp:      TempSrc:      SpO2: 98% 97% 96% 96%    Physical Exam Constitutional:      General: She is not in acute distress.    Appearance: Normal appearance.  HENT:     Head: Normocephalic and atraumatic.     Mouth/Throat:     Mouth: Mucous membranes are moist.     Pharynx: Oropharynx is clear.  Eyes:     Extraocular Movements: Extraocular movements intact.     Pupils: Pupils are equal, round, and reactive to light.  Cardiovascular:     Rate and Rhythm: Normal rate and regular rhythm.     Pulses: Normal pulses.     Heart sounds: Normal heart sounds.  Pulmonary:     Effort: Pulmonary  effort is normal. No respiratory distress.     Breath sounds: Rales present.  Abdominal:     General: Bowel sounds are normal. There is no distension.     Palpations: Abdomen is soft.     Tenderness: There is no abdominal tenderness.  Musculoskeletal:        General: No swelling or deformity.  Skin:    General: Skin is warm and dry.  Neurological:     General: No focal deficit present.     Mental Status: Mental status is at baseline.   ***  Labs on Admission: I have personally reviewed following labs and imaging studies  CBC: Recent Labs  Lab 11/19/21 1611  WBC 10.4  NEUTROABS 6.7  HGB 9.3*  HCT 30.4*   MCV 95.0  PLT 161    Basic Metabolic Panel: Recent Labs  Lab 11/19/21 1611  NA 141  K 4.2  CL 103  CO2 23  GLUCOSE 242*  BUN 45*  CREATININE 2.04*  CALCIUM 8.4*    GFR: CrCl cannot be calculated (Unknown ideal weight.).  Liver Function Tests: Recent Labs  Lab 11/19/21 1611  AST 19  ALT 12  ALKPHOS 82  BILITOT 0.3  PROT 7.0  ALBUMIN 2.7*    Urine analysis:    Component Value Date/Time   COLORURINE YELLOW 10/13/2021 1908   APPEARANCEUR HAZY (A) 10/13/2021 1908   LABSPEC 1.016 10/13/2021 1908   PHURINE 5.0 10/13/2021 1908   GLUCOSEU NEGATIVE 10/13/2021 1908   HGBUR NEGATIVE 10/13/2021 1908   BILIRUBINUR NEGATIVE 10/13/2021 1908   BILIRUBINUR neg 06/03/2019 1116   KETONESUR NEGATIVE 10/13/2021 1908   PROTEINUR 100 (A) 10/13/2021 1908   UROBILINOGEN 0.2 06/03/2019 1116   UROBILINOGEN 0.2 02/16/2018 1514   NITRITE NEGATIVE 10/13/2021 1908   LEUKOCYTESUR SMALL (A) 10/13/2021 1908    Radiological Exams on Admission: DG Chest 2 View  Result Date: 11/19/2021 CLINICAL DATA:  Chest pain EXAM: CHEST - 2 VIEW COMPARISON:  Previous studies including the examination of 10/16/2021 FINDINGS: Transverse diameter of heart is slightly increased. Central pulmonary vessels are prominent the there is increased density in the lateral aspect of left upper lung field. Rest of the lung fields are unremarkable. There is no significant pleural effusion or pneumothorax. IMPRESSION: Increased density in left upper lung field may be an artifact due to chest wall attenuation or suggest asymmetric pulmonary edema or pneumonia. Electronically Signed   By: Elmer Picker M.D.   On: 11/19/2021 18:02    EKG: Independently reviewed.  Sinus rhythm at 74 bpm.  Nonspecific T wave changes.  Some baseline artifact.  Assessment/Plan Principal Problem:   Acute on chronic diastolic (congestive) heart failure (HCC) Active Problems:   Coronary artery disease of native artery of native heart with  stable angina pectoris (HCC)   COPD with chronic bronchitis (HCC)   Type 2 diabetes mellitus with hyperlipidemia (HCC)   Essential hypertension   Hyperlipidemia   Morbid (severe) obesity due to excess calories (HCC)   Microcytic anemia   URINARY INCONTINENCE   Insomnia   OSA (obstructive sleep apnea)   Type 2 diabetes mellitus with diabetic foot deformity (HCC)   Gout   Type 2 diabetes mellitus with stage 3b chronic kidney disease, with long-term current use of insulin (HCC)   Severe episode of recurrent major depressive disorder, without psychotic features ([place])   GERD with esophagitis   Dementia ([place])   Type 2 diabetes mellitus with hyperglycemia, with long-term current use of insulin ([place])  Breast cancer ([place])   Anxiety   CHF exacerbation > Patient presenting with some chest pressure and increased dyspnea on exertion.  Also noted to have AKI as below. > Last echo was February of this year with EF 16-55%, grade 1 diastolic dysfunction, normal RV function. > In the ED chest x-ray with possible pulmonary edema, noted to have rales on exam.  BNP was not significantly elevated however due to patient's obesity this could be falsely low.  Does have elevated creatinine as below.  Given her age and clinical picture patient being admitted for observation and IV diuresis. - Monitor on telemetry - Continue with IV diuresis - Strict I's and O's, daily weights - Hold off on repeat echo due to recent echo in February - Check magnesium - Trend renal function and electrolytes  AKI on CKD3B > Creatinine in ED elevated 2.04 from baseline around 1.5. > Likely secondary to CHF exacerbation as above - Monitor response to Lasix - Trend renal function electrolytes - Avoid nephrotoxic agents  CAD Hyperlipidemia > Status post stenting x4 - Continue home atorvastatin, carvedilol, Imdur, aspirin, Plavix***  Anxiety Depression Insomnia - Continue home Lexapro, hydroxyzine  Dementia - Continue  home Namenda  Gout - Continue home fluoxetine at***  COPD - Replace home Symbicort with formulary Dulera - Continue home as needed albuterol   Diabetes > On 42 units daily and 16 units with meals at home.*** - 25 units daily - SSI***  Anemia > Hemoglobin stable at 9.3. - Trend CBC  GERD - Continue home PPI  Chronic pain - Continue home oxycodone***  Breast cancer - Follows with oncology - Continue home anastrozole  OSA - Continue home CPAP***  Obesity - Noted ***   DVT prophylaxis: Lovenox Code Status:   DNR Family Communication:  ***  Disposition Plan:   Patient is from:  Home  Anticipated DC to:  Home  Anticipated DC date:  1 to 2 days  Anticipated DC barriers: None  Consults called:  None Admission status:  Observation, telemetry  Severity of Illness: The appropriate patient status for this patient is OBSERVATION. Observation status is judged to be reasonable and necessary in order to provide the required intensity of service to ensure the patient's safety. [name] presenting symptoms, physical exam findings, and initial radiographic and laboratory data in the context of their medical condition is felt to place them at decreased risk for further clinical deterioration. Furthermore, it is anticipated that the patient will be medically stable for discharge from the hospital within 2 midnights of admission.    Marcelyn Bruins MD [facility]  How to contact the [facility] Attending or Consulting provider [place] or covering provider during after hours [place], for this patient?   Check the care team in [facility] and look for a) attending/consulting [facility] provider listed and b) the [facility] team listed Log into [email or web address] and use [place]'s universal password to access. If you do not have the password, please contact the hospital operator. Locate the [facility] provider you are looking for under [facility] and page to a number that you can be directly  reached. If you still have difficulty reaching the provider, please page the [facility] (Director on Call) for the Hospitalists listed on amion for assistance.  11/19/2021, 10:50 PM

## 2021-11-19 NOTE — ED Provider Notes (Signed)
[facility] EMERGENCY DEPARTMENT Provider Note   CSN: [id number] Arrival date & time: 11/19/21  1553     History {Add pertinent medical, surgical, social history, OB history to HPI:1} Chief Complaint  Patient presents with   Chest Pain    [name] is a 87 y.o. female.  Patient with known heart disease, pulm embolism history, high blood pressure, heart attack presents with episode of chest pressure earlier today followed by increased dizziness and shortness of breath with exertion.  Patient wheelchair-bound.  Patient has nausea and general weakness for the past 2 days.  No vomiting.  No current chest pain.  Patient had aspirin given by EMS.  Patient was admitted for heart failure in June.  No fevers however patient's had mild nonproductive cough.       Home Medications Prior to Admission medications   Medication Sig Start Date End Date Taking? Authorizing Provider  albuterol (PROAIR HFA) 108 (90 Base) MCG/ACT inhaler Inhale 1-2 puffs into the lungs every 6 (six) hours as needed for wheezing or shortness of breath. 12/19/18   [name], NP  anastrozole (ARIMIDEX) 1 MG tablet TAKE 1 TABLET BY MOUTH EVERY DAY 11/15/21   [name], MD  ASPIRIN LOW DOSE 81 MG EC tablet TAKE 1 TABLET BY MOUTH EVERY DAY 04/21/21   [name], NP  atorvastatin (LIPITOR) 80 MG tablet TAKE 1 TABLET BY MOUTH EVERY DAY 02/07/21   [name], NP  budesonide-formoterol (SYMBICORT) 160-4.5 MCG/ACT inhaler Inhale 2 puffs into the lungs 2 (two) times daily. 01/26/21   [name], NP  carvedilol (COREG) 12.5 MG tablet TAKE 1 TABLET BY MOUTH 2 TIMES DAILY 08/01/21   [name], NP  cetirizine (ZYRTEC) 10 MG tablet Take 10 mg by mouth daily.    [provider]  clopidogrel (PLAVIX) 75 MG tablet Take 1 tablet (75 mg total) by mouth daily. 05/26/21   Freada Bergeron, MD  Continuous Blood Gluc Sensor (FREESTYLE LIBRE 14 DAY SENSOR) MISC  PLACE 1 DEVICE ON THE SKIN AS DIRECTED EVERY 14 DAYS 11/10/21   Shamleffer, [name], MD  diclofenac Sodium (VOLTAREN) 1 % GEL Apply 2 g topically 4 (four) times daily. 09/05/21   [name], NP  escitalopram (LEXAPRO) 10 MG tablet Take 1 tablet (10 mg total) by mouth daily. Take 1/2 tablet in 2 weeks, then go to whole tablet 11/10/21 12/10/21  Bonnita Hollow, MD  febuxostat (ULORIC) 40 MG tablet TAKE 1 TABLET BY MOUTH EVERY DAY 10/12/21   [name], NP  feeding supplement, GLUCERNA SHAKE, (GLUCERNA SHAKE) LIQD Take 237 mLs by mouth 3 (three) times daily between meals. 10/22/21 11/21/21  Arrien, Jimmy Picket, MD  fluticasone (FLONASE) 50 MCG/ACT nasal spray Place 1 spray into both nostrils in the morning and at bedtime. 12/17/20   Ngetich, Dinah C, NP  folic acid (FOLVITE) 1 MG tablet TAKE 1 TABLET BY MOUTH EVERY DAY 08/01/21   [name], NP  furosemide (LASIX) 20 MG tablet Take 20 mg by mouth daily as needed. 10/24/21   [provider]  furosemide (LASIX) 40 MG tablet Take 1 tablet (40 mg total) by mouth daily. May take extra 49m as needed for leg swelling. No more than 2 times per week. 10/25/21 11/24/21  Clegg, Amy D, NP  guaiFENesin (MUCINEX) 600 MG 12 hr tablet Take 1 tablet (600 mg total) by mouth 2 (two) times daily as needed. 03/14/21   Medina-Vargas,  Monina C, NP  hydrOXYzine (ATARAX) 10 MG tablet Take 1 tablet (10 mg total) by mouth every 8 (eight) hours as needed for anxiety. 10/22/21   Arrien, Jimmy Picket, MD  insulin glargine, 2 Unit Dial, (TOUJEO MAX SOLOSTAR) 300 UNIT/ML Solostar Pen Inject 42 Units into the skin daily. Patient assistance program provides 05/24/21   Shamleffer, [name], MD  ipratropium-albuterol (DUONEB) 0.5-2.5 (3) MG/3ML SOLN Take 3 mLs by nebulization every 6 (six) hours as needed. 10/27/21   Bonnita Hollow, MD  isosorbide mononitrate (IMDUR) 60 MG 24 hr tablet TAKE 1 TABLET BY MOUTH EVERY DAY 10/12/21   [name], NP   linaclotide (LINZESS) 145 MCG CAPS capsule Take 1 capsule (145 mcg total) by mouth daily before breakfast. Patient taking differently: Take 145 mcg by mouth daily as needed (for constipation). 09/05/21   [name], NP  meclizine (ANTIVERT) 25 MG tablet TAKE 1 TABLET BY MOUTH THREE TIMES DAILY AS NEEDED FOR DIZZINESS Patient taking differently: Take 25 mg by mouth in the morning. 10/11/21   [name], NP  memantine (NAMENDA) 5 MG tablet TAKE 2 TABLETS BY MOUTH EVERY DAY and TAKE 1 TABLET BY MOUTH EVERY EVENING 09/26/21   [name], NP  Nebulizer System All-In-One MISC 1 kit by Does not apply route as needed. 11/14/21   Bonnita Hollow, MD  nitroGLYCERIN (NITROSTAT) 0.4 MG SL tablet Take one tablet under the tongue every 5 minutes as needed for chest pain 09/24/13   [name], NP  NOVOLOG FLEXPEN 100 UNIT/ML FlexPen Inject 16 Units into the skin 3 (three) times daily with meals.    [provider]  nystatin cream (MYCOSTATIN) Apply 1 application topically 2 (two) times daily. 12/17/20   Ngetich, Dinah C, NP  oxyCODONE-acetaminophen (PERCOCET) 7.5-325 MG tablet Take 1 tablet by mouth every 12 (twelve) hours as needed for moderate pain or severe pain. Must last 30 days. 11/11/21 12/11/21  Gillis Santa, MD  pantoprazole (PROTONIX) 20 MG tablet Take 1 tablet (20 mg total) by mouth daily. 07/11/21   [name], NP  polyethylene glycol powder (MIRALAX) 17 GM/SCOOP powder 17 gm mix with liquid twice daily as needed 09/05/21   [name], NP  Respiratory Therapy Supplies (NEBULIZER/TUBING/MOUTHPIECE) KIT 1 Units by Does not apply route as needed (for breathing treatments). 10/27/21   Bonnita Hollow, MD  sodium polystyrene (SPS) 15 GM/60ML suspension take 32ms by MOUTH ONCE WEEKLY. TO keep potassium down 11/15/21   ELauree Chandler NP  Vitamin D, Ergocalciferol, (DRISDOL) 1.25 MG (50000 UNIT) CAPS capsule TAKE 1 CAPSULE BY MOUTH every 7 days Patient taking  differently: Take 50,000 Units by mouth every Monday. 10/12/21   KBrunetta Genera MD      Allergies    Sulfonamide derivatives, Lokelma [sodium zirconium cyclosilicate], Aricept [donepezil hcl], and Tramadol    Review of Systems   Review of Systems  Constitutional:  Positive for fatigue. Negative for chills and fever.  HENT:  Negative for congestion.   Eyes:  Negative for visual disturbance.  Respiratory:  Positive for shortness of breath.   Cardiovascular:  Positive for chest pain.  Gastrointestinal:  Negative for abdominal pain and vomiting.  Genitourinary:  Negative for dysuria and flank pain.  Musculoskeletal:  Negative for back pain, neck pain and neck stiffness.  Skin:  Negative for rash.  Neurological:  Negative for light-headedness and headaches.    Physical Exam Updated Vital Signs BP (!) 161/74  Pulse 73   Temp 98.1 F (36.7 C) (Oral)   Resp 18   SpO2 97%  Physical Exam Vitals and nursing note reviewed.  Constitutional:      General: She is not in acute distress.    Appearance: She is well-developed.  HENT:     Head: Normocephalic and atraumatic.     Mouth/Throat:     Mouth: Mucous membranes are moist.  Eyes:     General:        Right eye: No discharge.        Left eye: No discharge.     Conjunctiva/sclera: Conjunctivae normal.  Neck:     Trachea: No tracheal deviation.  Cardiovascular:     Rate and Rhythm: Normal rate and regular rhythm.  Pulmonary:     Effort: Pulmonary effort is normal.     Breath sounds: Examination of the right-lower field reveals rales. Examination of the left-lower field reveals rales. Rales present.  Abdominal:     General: There is no distension.     Palpations: Abdomen is soft.     Tenderness: There is no abdominal tenderness. There is no guarding.  Musculoskeletal:     Cervical back: Normal range of motion and neck supple. No rigidity.     Right lower leg: Edema (mild) present.     Left lower leg: Edema (mild) present.   Skin:    General: Skin is warm.     Capillary Refill: Capillary refill takes less than 2 seconds.     Findings: No rash.  Neurological:     General: No focal deficit present.     Mental Status: She is alert.     Cranial Nerves: No cranial nerve deficit.  Psychiatric:        Mood and Affect: Mood normal.     ED Results / Procedures / Treatments   Labs (all labs ordered are listed, but only abnormal results are displayed) Labs Reviewed  COMPREHENSIVE METABOLIC PANEL - Abnormal; Notable for the following components:      Result Value   Glucose, Bld 242 (*)    BUN 45 (*)    Creatinine, Ser 2.04 (*)    Calcium 8.4 (*)    Albumin 2.7 (*)    GFR, Estimated 23 (*)    All other components within normal limits  CBC WITH DIFFERENTIAL/PLATELET - Abnormal; Notable for the following components:   RBC 3.20 (*)    Hemoglobin 9.3 (*)    HCT 30.4 (*)    All other components within normal limits  LIPASE, BLOOD  BRAIN NATRIURETIC PEPTIDE  TROPONIN I (HIGH SENSITIVITY)  TROPONIN I (HIGH SENSITIVITY)    EKG EKG Interpretation  Date/Time:  Saturday November 19 2021 19:01:41 EDT Ventricular Rate:  74 PR Interval:  184 QRS Duration: 105 QT Interval:  427 QTC Calculation: 471 R Axis:   -4 Text Interpretation: Sinus rhythm Abnormal R-wave progression, early transition Inferior infarct, old Confirmed by Elnora Morrison [phone]) on 11/19/2021 7:12:44 PM  Radiology DG Chest 2 View  Result Date: 11/19/2021 CLINICAL DATA:  Chest pain EXAM: CHEST - 2 VIEW COMPARISON:  Previous studies including the examination of 10/16/2021 FINDINGS: Transverse diameter of heart is slightly increased. Central pulmonary vessels are prominent the there is increased density in the lateral aspect of left upper lung field. Rest of the lung fields are unremarkable. There is no significant pleural effusion or pneumothorax. IMPRESSION: Increased density in left upper lung field may be an artifact due to chest wall attenuation  or suggest asymmetric pulmonary edema or pneumonia. Electronically Signed   By: Elmer Picker M.D.   On: 11/19/2021 18:02    Procedures Procedures  {Document cardiac monitor, telemetry assessment procedure when appropriate:1}  Medications Ordered in ED Medications  furosemide (LASIX) tablet 40 mg (has no administration in time range)    ED Course/ Medical Decision Making/ A&P                           Medical Decision Making Amount and/or Complexity of Data Reviewed Labs: ordered. Radiology: ordered.  Risk Prescription drug management.   Patient presents with shortness of breath, chest pain episode and feeling generally weak with significant cardiac and other medical problems.  Reviewed medical records and patient had discharge summary June 8 after admission for heart failure.  Medical records also reviewed showing heart cath in October 2 and 21 with severe stenosis and an ejection fraction 55 to 60% in February.  Patient has mild general symptoms at this time.  Differential broad including heart failure with crackles at the bases/cough/cardiac history, ACS given significant history and chest pressure episode, metabolic, anemia, pneumonia given cough however less likely normal white count/no fever and nonproductive, other.  Oral Lasix given, patient has been instructed to take extra Lasix for shortness of breath and concern for fluid.  Chest x-ray shows possible small fluid versus early pneumonia.  Hospitalist paged for further discussion for observation/telemetry.  Discussed this with patient and family in the room.    {Document critical care time when appropriate:1} {Document review of labs and clinical decision tools ie heart score, Chads2Vasc2 etc:1}  {Document your independent review of radiology images, and any outside records:1} {Document your discussion with family members, caretakers, and with consultants:1} {Document social determinants of health affecting pt's  care:1} {Document your decision making why or why not admission, treatments were needed:1} Final Clinical Impression(s) / ED Diagnoses Final diagnoses:  Acute pulmonary edema ([place])  Acute cough  Acute chest pain  Acute renal insufficiency    Rx / DC Orders ED Discharge Orders          Ordered    Ambulatory referral to Cardiology       Comments: If you have not heard from the Cardiology office within the next 72 hours please call [phone].   11/19/21 2219

## 2021-11-19 NOTE — ED Triage Notes (Addendum)
Pt from home c/o epigastric pain. Increased dizziness, nausea, and weakness. Been going on for 2 days. Denies vomiting. She doesn't walk due to weakness. Uses wheelchair at home pushed by family. '324mg'$  aspirin given by EMS.

## 2021-11-19 NOTE — ED Provider Notes (Incomplete)
[facility] EMERGENCY DEPARTMENT Provider Note   CSN: [id number] Arrival date & time: 11/19/21  1553     History {Add pertinent medical, surgical, social history, OB history to HPI:1} No chief complaint on file.   [name] is a 87 y.o. female.  Patient with h/o HTN, hyperlipidemia, CAD s/p coronary stenting,  left breast cancer, diabetes, dementia, h/o PE currently on DAPT, HFpEF --       Home Medications Prior to Admission medications   Medication Sig Start Date End Date Taking? Authorizing Provider  albuterol (PROAIR HFA) 108 (90 Base) MCG/ACT inhaler Inhale 1-2 puffs into the lungs every 6 (six) hours as needed for wheezing or shortness of breath. 12/19/18   [name], NP  anastrozole (ARIMIDEX) 1 MG tablet TAKE 1 TABLET BY MOUTH EVERY DAY 11/15/21   [name], MD  ASPIRIN LOW DOSE 81 MG EC tablet TAKE 1 TABLET BY MOUTH EVERY DAY 04/21/21   [name], NP  atorvastatin (LIPITOR) 80 MG tablet TAKE 1 TABLET BY MOUTH EVERY DAY 02/07/21   [name], NP  budesonide-formoterol (SYMBICORT) 160-4.5 MCG/ACT inhaler Inhale 2 puffs into the lungs 2 (two) times daily. 01/26/21   [name], NP  carvedilol (COREG) 12.5 MG tablet TAKE 1 TABLET BY MOUTH 2 TIMES DAILY 08/01/21   [name], NP  cetirizine (ZYRTEC) 10 MG tablet Take 10 mg by mouth daily.    [provider]  clopidogrel (PLAVIX) 75 MG tablet Take 1 tablet (75 mg total) by mouth daily. 05/26/21   Freada Bergeron, MD  Continuous Blood Gluc Sensor (FREESTYLE LIBRE 14 DAY SENSOR) MISC PLACE 1 DEVICE ON THE SKIN AS DIRECTED EVERY 14 DAYS 11/10/21   Shamleffer, [name], MD  diclofenac Sodium (VOLTAREN) 1 % GEL Apply 2 g topically 4 (four) times daily. 09/05/21   [name], NP  escitalopram (LEXAPRO) 10 MG tablet Take 1 tablet (10 mg total) by mouth daily. Take 1/2 tablet in 2 weeks, then go to whole tablet 11/10/21 12/10/21  Bonnita Hollow,  MD  febuxostat (ULORIC) 40 MG tablet TAKE 1 TABLET BY MOUTH EVERY DAY 10/12/21   [name], NP  feeding supplement, GLUCERNA SHAKE, (GLUCERNA SHAKE) LIQD Take 237 mLs by mouth 3 (three) times daily between meals. 10/22/21 11/21/21  Arrien, Jimmy Picket, MD  fluticasone (FLONASE) 50 MCG/ACT nasal spray Place 1 spray into both nostrils in the morning and at bedtime. 12/17/20   Ngetich, Dinah C, NP  folic acid (FOLVITE) 1 MG tablet TAKE 1 TABLET BY MOUTH EVERY DAY 08/01/21   [name], NP  furosemide (LASIX) 40 MG tablet Take 1 tablet (40 mg total) by mouth daily. May take extra 93m as needed for leg swelling. No more than 2 times per week. 10/25/21 11/24/21  Clegg, Amy D, NP  guaiFENesin (MUCINEX) 600 MG 12 hr tablet Take 1 tablet (600 mg total) by mouth 2 (two) times daily as needed. 03/14/21   Medina-Vargas, Monina C, NP  hydrOXYzine (ATARAX) 10 MG tablet Take 1 tablet (10 mg total) by mouth every 8 (eight) hours as needed for anxiety. 10/22/21   Arrien, MJimmy Picket MD  insulin glargine, 2 Unit Dial, (TOUJEO MAX SOLOSTAR) 300 UNIT/ML Solostar Pen Inject 42 Units into the skin daily. Patient assistance program provides 05/24/21   Shamleffer, IMelanie Crazier MD  ipratropium-albuterol (DUONEB) 0.5-2.5 (3) MG/3ML SOLN Take 3 mLs by nebulization every 6 (six) hours as needed. 10/27/21  Bonnita Hollow, MD  isosorbide mononitrate (IMDUR) 60 MG 24 hr tablet TAKE 1 TABLET BY MOUTH EVERY DAY 10/12/21   [name], NP  linaclotide (LINZESS) 145 MCG CAPS capsule Take 1 capsule (145 mcg total) by mouth daily before breakfast. Patient taking differently: Take 145 mcg by mouth daily as needed (for constipation). 09/05/21   [name], NP  meclizine (ANTIVERT) 25 MG tablet TAKE 1 TABLET BY MOUTH THREE TIMES DAILY AS NEEDED FOR DIZZINESS Patient taking differently: Take 25 mg by mouth in the morning. 10/11/21   [name], NP  memantine (NAMENDA) 5 MG tablet TAKE 2 TABLETS BY  MOUTH EVERY DAY and TAKE 1 TABLET BY MOUTH EVERY EVENING 09/26/21   [name], NP  Nebulizer System All-In-One MISC 1 kit by Does not apply route as needed. 11/14/21   Bonnita Hollow, MD  nitroGLYCERIN (NITROSTAT) 0.4 MG SL tablet Take one tablet under the tongue every 5 minutes as needed for chest pain 09/24/13   [name], NP  NOVOLOG FLEXPEN 100 UNIT/ML FlexPen Inject 16 Units into the skin 3 (three) times daily with meals.    [provider]  nystatin cream (MYCOSTATIN) Apply 1 application topically 2 (two) times daily. 12/17/20   Ngetich, Dinah C, NP  oxyCODONE-acetaminophen (PERCOCET) 7.5-325 MG tablet Take 1 tablet by mouth every 12 (twelve) hours as needed for moderate pain or severe pain. Must last 30 days. 11/11/21 12/11/21  Gillis Santa, MD  pantoprazole (PROTONIX) 20 MG tablet Take 1 tablet (20 mg total) by mouth daily. 07/11/21   [name], NP  polyethylene glycol powder (MIRALAX) 17 GM/SCOOP powder 17 gm mix with liquid twice daily as needed 09/05/21   [name], NP  Respiratory Therapy Supplies (NEBULIZER/TUBING/MOUTHPIECE) KIT 1 Units by Does not apply route as needed (for breathing treatments). 10/27/21   Bonnita Hollow, MD  sodium polystyrene (SPS) 15 GM/60ML suspension take 35ms by MOUTH ONCE WEEKLY. TO keep potassium down 11/15/21   ELauree Chandler NP  Vitamin D, Ergocalciferol, (DRISDOL) 1.25 MG (50000 UNIT) CAPS capsule TAKE 1 CAPSULE BY MOUTH every 7 days Patient taking differently: Take 50,000 Units by mouth every Monday. 10/12/21   KBrunetta Genera MD      Allergies    Sulfonamide derivatives, Lokelma [sodium zirconium cyclosilicate], Aricept [donepezil hcl], and Tramadol    Review of Systems   Review of Systems  Physical Exam Updated Vital Signs BP (!) 131/56   Pulse 72   Temp 98.1 F (36.7 C) (Oral)   Resp 18   SpO2 98%  Physical Exam  ED Results / Procedures / Treatments   Labs (all labs ordered are listed, but  only abnormal results are displayed) Labs Reviewed  COMPREHENSIVE METABOLIC PANEL - Abnormal; Notable for the following components:      Result Value   Glucose, Bld 242 (*)    BUN 45 (*)    Creatinine, Ser 2.04 (*)    Calcium 8.4 (*)    Albumin 2.7 (*)    GFR, Estimated 23 (*)    All other components within normal limits  CBC WITH DIFFERENTIAL/PLATELET - Abnormal; Notable for the following components:   RBC 3.20 (*)    Hemoglobin 9.3 (*)    HCT 30.4 (*)    All other components within normal limits  LIPASE, BLOOD  TROPONIN I (HIGH SENSITIVITY)  TROPONIN I (HIGH SENSITIVITY)    EKG None  Radiology DG Chest 2 View  Result Date: 11/19/2021 CLINICAL DATA:  Chest pain EXAM: CHEST - 2 VIEW COMPARISON:  Previous studies including the examination of 10/16/2021 FINDINGS: Transverse diameter of heart is slightly increased. Central pulmonary vessels are prominent the there is increased density in the lateral aspect of left upper lung field. Rest of the lung fields are unremarkable. There is no significant pleural effusion or pneumothorax. IMPRESSION: Increased density in left upper lung field may be an artifact due to chest wall attenuation or suggest asymmetric pulmonary edema or pneumonia. Electronically Signed   By: Elmer Picker M.D.   On: 11/19/2021 18:02    Procedures Procedures  {Document cardiac monitor, telemetry assessment procedure when appropriate:1}  Medications Ordered in ED Medications - No data to display  ED Course/ Medical Decision Making/ A&P                           Medical Decision Making Amount and/or Complexity of Data Reviewed Labs: ordered. Radiology: ordered.   ***  {Document critical care time when appropriate:1} {Document review of labs and clinical decision tools ie heart score, Chads2Vasc2 etc:1}  {Document your independent review of radiology images, and any outside records:1} {Document your discussion with family members, caretakers, and  with consultants:1} {Document social determinants of health affecting pt's care:1} {Document your decision making why or why not admission, treatments were needed:1} Final Clinical Impression(s) / ED Diagnoses Final diagnoses:  None    Rx / DC Orders ED Discharge Orders     None

## 2021-11-19 NOTE — Progress Notes (Signed)
[facility] encountered pt.'s dtr. in ED hallway requesting graham crackers and a drink for pt. [place] brought these after checking w/medical staff.  Pt. says she came to [facility] this afternoon after experiencing pain moving up and down her body since last night.  She says she feels better currently but attributes this more to rest than any specific treatment she has received.  Pt. and dtr. live close to one another in [place]; pt. has had diabetes for many years, per dtr. but has only recently felt an urgent need to be cautious about the sugar she eats.  Pt. and dtr. weighing medical team's suggestion that pt. stay overnight in the hospital for observation given her past cardiac history.  Chaplains remain available as needed.

## 2021-11-20 ENCOUNTER — Other Ambulatory Visit: Payer: Self-pay

## 2021-11-20 ENCOUNTER — Encounter (HOSPITAL_COMMUNITY): Payer: Self-pay | Admitting: Internal Medicine

## 2021-11-20 DIAGNOSIS — E1140 Type 2 diabetes mellitus with diabetic neuropathy, unspecified: Secondary | ICD-10-CM | POA: Diagnosis present

## 2021-11-20 DIAGNOSIS — R079 Chest pain, unspecified: Secondary | ICD-10-CM | POA: Diagnosis not present

## 2021-11-20 DIAGNOSIS — E875 Hyperkalemia: Secondary | ICD-10-CM | POA: Diagnosis present

## 2021-11-20 DIAGNOSIS — E785 Hyperlipidemia, unspecified: Secondary | ICD-10-CM | POA: Diagnosis present

## 2021-11-20 DIAGNOSIS — R0789 Other chest pain: Secondary | ICD-10-CM | POA: Diagnosis present

## 2021-11-20 DIAGNOSIS — F332 Major depressive disorder, recurrent severe without psychotic features: Secondary | ICD-10-CM | POA: Diagnosis present

## 2021-11-20 DIAGNOSIS — N289 Disorder of kidney and ureter, unspecified: Secondary | ICD-10-CM

## 2021-11-20 DIAGNOSIS — E1169 Type 2 diabetes mellitus with other specified complication: Secondary | ICD-10-CM | POA: Diagnosis present

## 2021-11-20 DIAGNOSIS — R051 Acute cough: Secondary | ICD-10-CM

## 2021-11-20 DIAGNOSIS — D631 Anemia in chronic kidney disease: Secondary | ICD-10-CM | POA: Diagnosis present

## 2021-11-20 DIAGNOSIS — N179 Acute kidney failure, unspecified: Secondary | ICD-10-CM | POA: Diagnosis present

## 2021-11-20 DIAGNOSIS — F0393 Unspecified dementia, unspecified severity, with mood disturbance: Secondary | ICD-10-CM | POA: Diagnosis present

## 2021-11-20 DIAGNOSIS — I10 Essential (primary) hypertension: Secondary | ICD-10-CM | POA: Diagnosis not present

## 2021-11-20 DIAGNOSIS — D509 Iron deficiency anemia, unspecified: Secondary | ICD-10-CM | POA: Diagnosis present

## 2021-11-20 DIAGNOSIS — J810 Acute pulmonary edema: Secondary | ICD-10-CM | POA: Diagnosis not present

## 2021-11-20 DIAGNOSIS — E8809 Other disorders of plasma-protein metabolism, not elsewhere classified: Secondary | ICD-10-CM | POA: Diagnosis present

## 2021-11-20 DIAGNOSIS — F0394 Unspecified dementia, unspecified severity, with anxiety: Secondary | ICD-10-CM | POA: Diagnosis present

## 2021-11-20 DIAGNOSIS — I509 Heart failure, unspecified: Secondary | ICD-10-CM

## 2021-11-20 DIAGNOSIS — I25118 Atherosclerotic heart disease of native coronary artery with other forms of angina pectoris: Secondary | ICD-10-CM | POA: Diagnosis present

## 2021-11-20 DIAGNOSIS — I5033 Acute on chronic diastolic (congestive) heart failure: Secondary | ICD-10-CM | POA: Diagnosis present

## 2021-11-20 DIAGNOSIS — N1832 Chronic kidney disease, stage 3b: Secondary | ICD-10-CM | POA: Diagnosis present

## 2021-11-20 DIAGNOSIS — J449 Chronic obstructive pulmonary disease, unspecified: Secondary | ICD-10-CM | POA: Diagnosis present

## 2021-11-20 DIAGNOSIS — F03918 Unspecified dementia, unspecified severity, with other behavioral disturbance: Secondary | ICD-10-CM | POA: Diagnosis present

## 2021-11-20 DIAGNOSIS — E1122 Type 2 diabetes mellitus with diabetic chronic kidney disease: Secondary | ICD-10-CM | POA: Diagnosis present

## 2021-11-20 DIAGNOSIS — Z66 Do not resuscitate: Secondary | ICD-10-CM | POA: Diagnosis present

## 2021-11-20 DIAGNOSIS — E1165 Type 2 diabetes mellitus with hyperglycemia: Secondary | ICD-10-CM | POA: Diagnosis present

## 2021-11-20 DIAGNOSIS — I130 Hypertensive heart and chronic kidney disease with heart failure and stage 1 through stage 4 chronic kidney disease, or unspecified chronic kidney disease: Secondary | ICD-10-CM | POA: Diagnosis present

## 2021-11-20 DIAGNOSIS — F419 Anxiety disorder, unspecified: Secondary | ICD-10-CM | POA: Diagnosis present

## 2021-11-20 DIAGNOSIS — Z6841 Body Mass Index (BMI) 40.0 and over, adult: Secondary | ICD-10-CM | POA: Diagnosis not present

## 2021-11-20 DIAGNOSIS — Z993 Dependence on wheelchair: Secondary | ICD-10-CM | POA: Diagnosis not present

## 2021-11-20 LAB — BASIC METABOLIC PANEL
Anion gap: 9 (ref 5–15)
BUN: 46 mg/dL — ABNORMAL HIGH (ref 8–23)
CO2: 23 mmol/L (ref 22–32)
Calcium: 8.1 mg/dL — ABNORMAL LOW (ref 8.9–10.3)
Chloride: 104 mmol/L (ref 98–111)
Creatinine, Ser: 1.91 mg/dL — ABNORMAL HIGH (ref 0.44–1.00)
GFR, Estimated: 25 mL/min — ABNORMAL LOW (ref >60)
Glucose, Bld: 241 mg/dL — ABNORMAL HIGH (ref 70–99)
Potassium: 4.5 mmol/L (ref 3.5–5.1)
Sodium: 136 mmol/L (ref 135–145)

## 2021-11-20 LAB — CBC
HCT: 29.9 % — ABNORMAL LOW (ref 36.0–46.0)
Hemoglobin: 9.4 g/dL — ABNORMAL LOW (ref 12.0–15.0)
MCH: 29.2 pg (ref 26.0–34.0)
MCHC: 31.4 g/dL (ref 30.0–36.0)
MCV: 92.9 fL (ref 80.0–100.0)
Platelets: 221 10*3/uL (ref 150–400)
RBC: 3.22 MIL/uL — ABNORMAL LOW (ref 3.87–5.11)
RDW: 14.1 % (ref 11.5–15.5)
WBC: 13.2 10*3/uL — ABNORMAL HIGH (ref 4.0–10.5)
nRBC: 0 % (ref 0.0–0.2)

## 2021-11-20 LAB — CBG MONITORING, ED: Glucose-Capillary: 255 mg/dL — ABNORMAL HIGH (ref 70–99)

## 2021-11-20 LAB — GLUCOSE, CAPILLARY
Glucose-Capillary: 195 mg/dL — ABNORMAL HIGH (ref 70–99)
Glucose-Capillary: 204 mg/dL — ABNORMAL HIGH (ref 70–99)
Glucose-Capillary: 267 mg/dL — ABNORMAL HIGH (ref 70–99)
Glucose-Capillary: 244 mg/dL — ABNORMAL HIGH (ref 70–99)

## 2021-11-20 LAB — MRSA NEXT GEN BY PCR, NASAL: MRSA by PCR Next Gen: NOT DETECTED

## 2021-11-20 LAB — MAGNESIUM: Magnesium: 1.2 mg/dL — ABNORMAL LOW (ref 1.7–2.4)

## 2021-11-20 MED ORDER — MELATONIN 5 MG PO TABS
5.0000 mg | ORAL_TABLET | Freq: Every evening | ORAL | Status: DC | PRN
  Administered 2021-11-20 – 2021-11-22 (×3): 5 mg via ORAL
  Filled 2021-11-20 (×4): qty 1

## 2021-11-20 MED ORDER — ENOXAPARIN SODIUM 40 MG/0.4ML IJ SOSY
40.0000 mg | PREFILLED_SYRINGE | INTRAMUSCULAR | Status: DC
  Administered 2021-11-21: 40 mg via SUBCUTANEOUS
  Filled 2021-11-20: qty 0.4

## 2021-11-20 MED ORDER — INSULIN ASPART 100 UNIT/ML IJ SOLN
5.0000 [IU] | Freq: Three times a day (TID) | INTRAMUSCULAR | Status: DC
  Administered 2021-11-20 – 2021-11-23 (×10): 5 [IU] via SUBCUTANEOUS

## 2021-11-20 MED ORDER — ALBUTEROL SULFATE (2.5 MG/3ML) 0.083% IN NEBU
2.5000 mg | INHALATION_SOLUTION | Freq: Four times a day (QID) | RESPIRATORY_TRACT | Status: DC | PRN
  Administered 2021-11-24 (×2): 2.5 mg via RESPIRATORY_TRACT
  Filled 2021-11-20 (×2): qty 3

## 2021-11-20 MED ORDER — INSULIN ASPART 100 UNIT/ML IJ SOLN
0.0000 [IU] | Freq: Three times a day (TID) | INTRAMUSCULAR | Status: DC
  Administered 2021-11-20: 8 [IU] via SUBCUTANEOUS
  Administered 2021-11-20: 3 [IU] via SUBCUTANEOUS
  Administered 2021-11-20: 5 [IU] via SUBCUTANEOUS
  Administered 2021-11-21: 2 [IU] via SUBCUTANEOUS
  Administered 2021-11-21: 11 [IU] via SUBCUTANEOUS
  Administered 2021-11-22: 2 [IU] via SUBCUTANEOUS
  Administered 2021-11-22: 3 [IU] via SUBCUTANEOUS
  Administered 2021-11-22 – 2021-11-23 (×2): 5 [IU] via SUBCUTANEOUS
  Administered 2021-11-23: 8 [IU] via SUBCUTANEOUS
  Administered 2021-11-23: 3 [IU] via SUBCUTANEOUS
  Administered 2021-11-24: 11 [IU] via SUBCUTANEOUS
  Administered 2021-11-24: 8 [IU] via SUBCUTANEOUS
  Administered 2021-11-24: 3 [IU] via SUBCUTANEOUS
  Administered 2021-11-25: 8 [IU] via SUBCUTANEOUS
  Administered 2021-11-25: 3 [IU] via SUBCUTANEOUS
  Administered 2021-11-25: 8 [IU] via SUBCUTANEOUS
  Administered 2021-11-26: 3 [IU] via SUBCUTANEOUS
  Administered 2021-11-26: 8 [IU] via SUBCUTANEOUS

## 2021-11-20 MED ORDER — INSULIN ASPART 100 UNIT/ML IJ SOLN
0.0000 [IU] | Freq: Every day | INTRAMUSCULAR | Status: DC
  Administered 2021-11-20: 3 [IU] via SUBCUTANEOUS
  Administered 2021-11-20: 2 [IU] via SUBCUTANEOUS
  Administered 2021-11-21 – 2021-11-22 (×2): 3 [IU] via SUBCUTANEOUS
  Administered 2021-11-23: 4 [IU] via SUBCUTANEOUS
  Administered 2021-11-24 – 2021-11-25 (×2): 3 [IU] via SUBCUTANEOUS

## 2021-11-20 MED ORDER — INSULIN GLARGINE-YFGN 100 UNIT/ML ~~LOC~~ SOLN
25.0000 [IU] | Freq: Every day | SUBCUTANEOUS | Status: DC
  Administered 2021-11-20 – 2021-11-23 (×4): 25 [IU] via SUBCUTANEOUS
  Filled 2021-11-20 (×7): qty 0.25

## 2021-11-20 MED ORDER — OXYCODONE-ACETAMINOPHEN 7.5-325 MG PO TABS
1.0000 | ORAL_TABLET | Freq: Two times a day (BID) | ORAL | Status: DC | PRN
  Administered 2021-11-20 – 2021-11-26 (×7): 1 via ORAL
  Filled 2021-11-20 (×7): qty 1

## 2021-11-20 NOTE — Plan of Care (Signed)
  Problem: Cardiac: Goal: Ability to achieve and maintain adequate cardiopulmonary perfusion will improve Outcome: Progressing   Problem: Coping: Goal: Ability to adjust to condition or change in health will improve Outcome: Progressing   Problem: Fluid Volume: Goal: Ability to maintain a balanced intake and output will improve Outcome: Progressing

## 2021-11-20 NOTE — Hospital Course (Signed)
[name] was admitted to the hospital with the working diagnosis of decompensated heart failure.   87 y.o. female with medical history significant of hypertension, hyperlipidemia, diastolic heart failure, depression, anxiety, insomnia, gout, CAD status post stent, COPD, OSA, pulmonary embolus, diabetes, CKD, obesity, anemia, GERD, dementia, chronic pain, breast cancer presenting with shortness of breath and chest pressure with associated nausea and weakness. Reported 2 days of symptoms, persistent and prompting her to [facility]. She is wheelchair bound. On her initial physical examination her blood pressure was 130 to 829 mmHg systolic, HR 72, RR 18 and 02 saturation 96% on room air. Lungs with bilateral wheezing and rales, heart with S1 and S2 present and rhythmic with no gallops, rubs or murmurs, abdomen not distended and positive lower extremity edema.   Na 141, K 4.2 CL 103, bicarbonate 23, glucose 242, bun 45 cr 2,0  Wbc 10,4 hgb 9,3 plt 225   Chest radiograph with cardiomegaly, bilateral hilar vascular congestion with cephalization of the vasculature, mild cardiomegaly, no effusions. Left rotation.   68 bpm, left axis, left anterior fascicular block, normal intervals, sinus rhythm with q waves lead II, III, AcV, V3 to V6, no significant ST segment or T wave changes.   Patient was placed on furosemide for diuresis.   07/18 continue volume overloaded.

## 2021-11-20 NOTE — Progress Notes (Deleted)
Cardiology Office Note:    Date:  11/20/2021   ID:  [name], DOB 1934/07/07, MRN [id number]  PCP:  Bonnita Hollow, MD   [facility] Providers Cardiologist:  Ena Dawley, MD {   Referring MD: [name], NP    History of Present Illness:    [name] is a 87 y.o. female with a hx of dementia, CAD (BMS to RCA 2004, DESx2 to mid and proximal RCA 2012, recent NSTEMI 02/2020 s/p DES to RCA), chronic diastolic CHF, DM, morbid obesity, remote gastric bypass and reversal, anemia, PMR, HLD, depression, OCD, anxiety, OA, COPD, prior PE/DVT, CKD stage III, breast CA who presents to clinic for follow-up.  Per review of the record, the patient has history of multiple medical problems as outlined above. She was admitted 02/2020 with chest pain/NSTEMI and underwent cath showing multivessel CAD but severe ISR in pRCA and m/dRCA lesion both treated with DES. There was diffuse mild to moderate disease in the LM as well as Lcx and LAD but nonobstructive and treated medically. LVEF was normal, borderline elevated LVEDP. 2D echo was technically difficult given habitus. Hospital course also notable for anemia requiring blood transfusion (has prior h/o anemia). IM discussed case with Dr. Irene Limbo, her oncologist, regarding ongoing maintenance Xarelto for prior h/o PE/DVT and the mutual decision was made to hold further Xarelto given need for DAPT and history of anemia.   Was seen in 02/2020 with [name] where she was tachycardic with elevated D-dimer. V/Q scan obtained without evidence of PE.  Was admitted 10/13/21 with A/C HFpEF. Diuresed with IV lasix and later transitioned to lasix 40 mg po daily. Discharge weight 315 pounds. Discharged 10/22/21.   Was seen in clinic by [name], NP where she was wheelchair bound and required assistance with all ADLs. She was continued on lasix '40mg'$  daily with extra dose as needed.  Today, ***  Past Medical History:  Diagnosis Date   Abdominal pain  01/26/2012   Acute kidney injury superimposed on chronic kidney disease ([place]) 12/18/2014   Allergy    takes Mucinex daily as needed   Anemia, unspecified    Anxiety    takes Clonazepam daily as needed   Anxiety associated with depression 06/08/2017   Arthritis    Breast cancer ([place])    Breast cancer screening 02/19/2014   Breast mass 10/27/2014   Chronic diastolic CHF (congestive heart failure) ([place])    takes Furosemide daily   Chronic pain syndrome 02/06/2015   CKD (chronic kidney disease), stage III ([place])    Coronary artery disease    a. s/p IMI 2004 tx with BMS to RCA;  b. s/p Promus DES to RCA 2/12 c. abnormal nuc 2016 -> cath with med rx. d. NSTEMI 02/2020  mv CAD with severe ISR in pRCA and m/dRCA lesion both treated with DES.   Debility 08/01/2012   Dementia ([place])    Depression    takes Cymbalta daily   Diabetes mellitus    insulin daily   DOE (dyspnea on exertion) 06/24/2014   Dysphagia, unspecified(787.20) 07/31/2012   Fungal dermatitis 11/13/2007   Qualifier: Diagnosis of  By: Burnice Logan  MD, Doretha Sou    GERD (gastroesophageal reflux disease)    takes Protonix daily   Gout, unspecified    Headache    occasionally   History of blood clots    History of deep venous thrombosis 01/27/2012   History of pulmonary embolus (PE) 04/22/2014   Hyperkalemia 07/26/2017  Hyperlipidemia    takes Pravastatin daily   Hypertension    Insomnia    Joint pain    Joint swelling    Morbid obesity ([place])    Multiple benign nevi 11/15/2016   Myocardial infarction [facility]) 2004   Non-STEMI (non-ST elevated myocardial infarction) ([place]) 02/15/2020   Numbness    Obstructive sleep apnea    does not wear cpap   Osteoarthritis    Osteoarthritis    Osteoarthrosis, unspecified whether generalized or localized, lower leg    Pain in the chest 12/31/2013   Pain, chronic    Personal history of other diseases of the digestive system 12/03/2006   Qualifier: Diagnosis of  By: Burnice Logan  MD, Doretha Sou     Polymyalgia rheumatica [facility])    Presence of stent in right coronary artery    Pulmonary emboli ([place]) 01/2012   felt to need lifelong anticoagulation but Xarelto dc in 02/2020 due to need for DAPT   Situational depression 06/04/2009   Qualifier: Diagnosis of  By: Burnice Logan  MD, Doretha Sou    Syncope, vasovagal 07/04/2021   Urinary incontinence    takes Linzess daily   Vaginitis and vulvovaginitis 06/23/2016   Vertigo    hx of;was taking Meclizine if needed   Wears dentures     Past Surgical History:  Procedure Laterality Date   ABDOMINAL HYSTERECTOMY     partial   APPENDECTOMY     blood clots/legs and lungs  2013   BREAST BIOPSY Left 07/22/2014   BREAST BIOPSY Left 02/10/2013   BREAST LUMPECTOMY Left 11/05/2014   BREAST LUMPECTOMY WITH RADIOACTIVE SEED LOCALIZATION Left 11/05/2014   Procedure: LEFT BREAST LUMPECTOMY WITH RADIOACTIVE SEED LOCALIZATION;  Surgeon: [name], MD;  Location: [place];  Service: General;  Laterality: Left;   CARDIAC CATHETERIZATION     COLONOSCOPY     CORONARY ANGIOPLASTY  2   CORONARY STENT INTERVENTION N/A 02/17/2020   Procedure: CORONARY STENT INTERVENTION;  Surgeon: Leonie Man, MD;  Location: [place] CV LAB;  Service: Cardiovascular;  Laterality: N/A;   ESOPHAGOGASTRODUODENOSCOPY (EGD) WITH PROPOFOL N/A 11/07/2016   Procedure: ESOPHAGOGASTRODUODENOSCOPY (EGD) WITH PROPOFOL;  Surgeon: Gatha Mayer, MD;  Location: [facility];  Service: Endoscopy;  Laterality: N/A;   EXCISION OF SKIN TAG Right 11/05/2014   Procedure: EXCISION OF RIGHT EYELID SKIN TAG;  Surgeon: [name], MD;  Location: [place];  Service: General;  Laterality: Right;   EYE SURGERY Bilateral    cataract    GASTRIC BYPASS  1977    reversed in 1979, [place] CATH AND CORONARY ANGIOGRAPHY N/A 02/17/2020   Procedure: LEFT HEART CATH AND CORONARY ANGIOGRAPHY;  Surgeon: Leonie Man, MD;  Location: [place] CV LAB;  Service: Cardiovascular;   Laterality: N/A;   LEFT HEART CATHETERIZATION WITH CORONARY ANGIOGRAM N/A 06/29/2014   Procedure: LEFT HEART CATHETERIZATION WITH CORONARY ANGIOGRAM;  Surgeon: Troy Sine, MD;  Location: [facility] CATH LAB;  Service: Cardiovascular;  Laterality: N/A;   MEMBRANE PEEL Right 10/23/2018   Procedure: MEMBRANE PEEL;  Surgeon: Hurman Horn, MD;  Location: [place];  Service: Ophthalmology;  Laterality: Right;   MI with stent placement  2004   PARS PLANA VITRECTOMY Right 10/23/2018   Procedure: PARS PLANA VITRECTOMY WITH 25 GAUGE;  Surgeon: Hurman Horn, MD;  Location: [place];  Service: Ophthalmology;  Laterality: Right;    Current Medications: No outpatient medications have been marked as taking for the 11/22/21 encounter (Appointment) with  Freada Bergeron, MD.     Allergies:   Sulfonamide derivatives, Lokelma [sodium zirconium cyclosilicate], Aricept [donepezil hcl], and Tramadol   Social History   Socioeconomic History   Marital status: Widowed    Spouse name: Not on file   Number of children: 3   Years of education: Not on file   Highest education level: High school graduate  Occupational History   Occupation: retired  Tobacco Use   Smoking status: Never    Passive exposure: Past   Smokeless tobacco: Never   Tobacco comments:    Both parents smoked, patient was exposed/ "Raised up in smoke, my whole life."  Vaping Use   Vaping Use: Never used  Substance and Sexual Activity   Alcohol use: No    Alcohol/week: 0.0 standard drinks of alcohol   Drug use: No   Sexual activity: Not Currently  Other Topics Concern   Not on file  Social History Narrative   Not on file   Social Determinants of Health   Financial Resource Strain: Low Risk  (10/14/2021)   Overall Financial Resource Strain (CARDIA)    Difficulty of Paying Living Expenses: Not very hard  Food Insecurity: No Food Insecurity (10/14/2021)   Hunger Vital Sign    Worried About Running Out of Food in the Last Year: Never true     Ran Out of Food in the Last Year: Never true  Transportation Needs: No Transportation Needs (10/14/2021)   PRAPARE - Hydrologist (Medical): No    Lack of Transportation (Non-Medical): No  Physical Activity: Inactive (07/26/2017)   Exercise Vital Sign    Days of Exercise per Week: 0 days    Minutes of Exercise per Session: 0 min  Stress: Stress Concern Present (07/26/2017)   [place]    Feeling of Stress : Rather much  Social Connections: Moderately Integrated (07/26/2017)   Social Connection and Isolation Panel [NHANES]    Frequency of Communication with Friends and Family: Once a week    Frequency of Social Gatherings with Friends and Family: Three times a week    Attends Religious Services: 1 to 4 times per year    Active Member of Clubs or Organizations: Yes    Attends Archivist Meetings: 1 to 4 times per year    Marital Status: Widowed     Family History: [name] family history includes Arthritis in her father and sister; Breast cancer (age of onset: 28) in her mother; Diabetes in her father and sister; Heart disease in her cousin and mother; Hypertension in her father; Obesity in her sister; Throat cancer in her father. There is no history of Colon cancer, Stomach cancer, or Esophageal cancer.  ROS:   Please see the history of present illness.    Review of Systems  Constitutional:  Negative for malaise/fatigue.  HENT:  Negative for sore throat.   Eyes:  Negative for blurred vision.  Respiratory:  Positive for cough. Negative for shortness of breath.   Cardiovascular:  Positive for leg swelling (bilateral). Negative for chest pain, palpitations, orthopnea, claudication and PND.  Gastrointestinal:  Positive for abdominal pain and diarrhea. Negative for blood in stool and melena.  Genitourinary:  Negative for hematuria.  Musculoskeletal:  Negative for falls.  Skin:   Negative for itching.  Neurological:  Positive for dizziness and headaches. Negative for loss of consciousness.  Endo/Heme/Allergies:  Positive for environmental allergies. Does not bruise/bleed easily.  Psychiatric/Behavioral:  Negative for depression.      EKGs/Labs/Other Studies Reviewed:    The following studies were reviewed today: Echo 07/05/21 1. Left ventricular ejection fraction, by estimation, is 55 to 60%. The  left ventricle has normal function. Left ventricular endocardial border  not optimally defined to evaluate regional wall motion- there are no wall  motion abnormalities seen in views  obtained. Left ventricular diastolic parameters are consistent with Grade  I diastolic dysfunction (impaired relaxation).   2. Right ventricular systolic function is normal. The right ventricular  size is normal.   3. The mitral valve was not well visualized. No evidence of mitral valve  regurgitation. No evidence of mitral stenosis.   4. The aortic valve was not well visualized. Aortic valve regurgitation  is not visualized.   Comparison(s): Difficult comparison- this is a technically difficult  study.   L Heart Cath 13/08/65 LV end diastolic pressure is mildly elevated. ------------------------------ Mid LM to Prox LAD lesion is 30% stenosed with 30% stenosed side branch in Ost Cx. Prox LAD to Mid LAD lesion is 45% stenosed with 25% stenosed side branch in 1st Diag. 1st Mrg lesion is 100% stenosed. ---------------- Colon Flattery RCA to Prox RCA stent is 10% stenosed. Prox RCA to Mid RCA stent is 5% stenosed. Prox RCA lesion is 95% stenosed, between the 2 old stents A drug-eluting stent was successfully placed using a STENT RESOLUTE ONYX 4.0X18 -> postdilated to 4.6-4.7 mm Post intervention, there is a 0% residual stenosis. ---------------- Mid RCA lesion is 85% stenosed after the second stent A drug-eluting stent was successfully placed using a STENT RESOLUTE ONYX 3.0X18 -> tapered post  dilation from 4.2-3.3 mm Post intervention, there is a 0% residual stenosis.   SUMMARY Multivessel CAD with severe (95%) IntraStent stenosis in the proximal RCA and de novo stenosis distal to mid RCA stent (85%) -> both treated with DES PCI using Resolute Onyx DES stents (distal-3.0 mm x 18 mm with tapered post dilation 4.2-3.3 mm; proximal 4.0 mm 18 mm postdilated to 4.7 mm.) Diffuse mild to moderate calcified disease in the distal Left Main as well as ostial and proximal LCx and LAD with no severe stenoses. Borderline elevated LVEDP with severe systemic hypertension     RECOMMENDATIONS Return to nursing unit for ongoing care.  Okay to DC heparin. We will need to determine plus or minus the use of Xarelto plus Plavix.  For now would continue aspirin plus Plavix until discharge and then discontinue aspirin on discharge with Xarelto plus Plavix for 3-6 months. Continue to monitor anemia and other risk factors.   2D Echo 02/15/20  1. Technically difficult echo with poor image quality.   2. Left ventricular ejection fraction, by estimation, is 65 to 70%. The  left ventricle has normal function. The left ventricle has no regional  wall motion abnormalities. Left ventricular diastolic parameters are  consistent with Grade I diastolic  dysfunction (impaired relaxation).   3. Right ventricular systolic function was not well visualized. The right  ventricular size is not well visualized.   4. The mitral valve was not well visualized. No evidence of mitral valve  regurgitation.   5. The aortic valve was not well visualized. Aortic valve regurgitation  is not visualized.    Cath 78/46/96 LV end diastolic pressure is mildly elevated. ------------------------------ Mid LM to Prox LAD lesion is 30% stenosed with 30% stenosed side branch in Ost Cx. Prox LAD to Mid LAD lesion is 45% stenosed with 25% stenosed side  branch in 1st Diag. 1st Mrg lesion is 100% stenosed. ---------------- Colon Flattery RCA to  Prox RCA stent is 10% stenosed. Prox RCA to Mid RCA stent is 5% stenosed. Prox RCA lesion is 95% stenosed, between the 2 old stents A drug-eluting stent was successfully placed using a STENT RESOLUTE ONYX 4.0X18 -> postdilated to 4.6-4.7 mm Post intervention, there is a 0% residual stenosis. ---------------- Mid RCA lesion is 85% stenosed after the second stent A drug-eluting stent was successfully placed using a STENT RESOLUTE ONYX 3.0X18 -> tapered post dilation from 4.2-3.3 mm Post intervention, there is a 0% residual stenosis.   SUMMARY Multivessel CAD with severe (95%) IntraStent stenosis in the proximal RCA and de novo stenosis distal to mid RCA stent (85%) -> both treated with DES PCI using Resolute Onyx DES stents (distal-3.0 mm x 18 mm with tapered post dilation 4.2-3.3 mm; proximal 4.0 mm 18 mm postdilated to 4.7 mm.) Diffuse mild to moderate calcified disease in the distal Left Main as well as ostial and proximal LCx and LAD with no severe stenoses. Borderline elevated LVEDP with severe systemic hypertension     RECOMMENDATIONS Return to nursing unit for ongoing care.  Okay to DC heparin. We will need to determine plus or minus the use of Xarelto plus Plavix.  For now would continue aspirin plus Plavix until discharge and then discontinue aspirin on discharge with Xarelto plus Plavix for 3-6 months. Continue to monitor anemia and other risk factors.  EKG:  EKG was not ordered today 05/18/21: NSR, inferior q waves, HR75  Recent Labs: 07/05/2021: TSH 1.194 11/19/2021: ALT 12; B Natriuretic Peptide 43.5 11/20/2021: BUN 46; Creatinine, Ser 1.91; Hemoglobin 9.4; Magnesium 1.2; Platelets 221; Potassium 4.5; Sodium 136  Recent Lipid Panel    Component Value Date/Time   CHOL 97 01/26/2021 1405   CHOL 136 04/20/2015 1001   TRIG 109 01/26/2021 1405   HDL 36 (L) 01/26/2021 1405   HDL 40 04/20/2015 1001   CHOLHDL 2.7 01/26/2021 1405   VLDL 17 02/15/2020 0636   LDLCALC 42 01/26/2021  1405     Risk Assessment/Calculations:           Physical Exam:    VS:  There were no vitals taken for this visit.    Wt Readings from Last 3 Encounters:  11/20/21 (!) 304 lb 14.3 oz (138.3 kg)  10/27/21 (!) 309 lb (140.2 kg)  10/25/21 (!) 310 lb (140.6 kg)     GEN:  Well nourished, well developed in no acute distress HEENT: Normal NECK: No JVD; No carotid bruits LYMPHATICS: No lymphadenopathy CARDIAC: RRR, no murmurs, rubs, gallops RESPIRATORY:  Clear to auscultation without rales, wheezing or rhonchi  ABDOMEN: Soft, non-tender, non-distended MUSCULOSKELETAL:  No edema; No deformity  SKIN: Warm and dry NEUROLOGIC:  Alert and oriented x 3 PSYCHIATRIC:  Normal affect   ASSESSMENT:    No diagnosis found.   PLAN:    In order of problems listed above:  #CAD with Prior NSTEMI 02/2020 s/p PCI to RCA: Had BMS to RCA 2004, DESx2 to mid and proximal RCA 2012, recent NSTEMI 02/2020 with ISR in [place] and mRCA s/p DES. Doing okay with no anginal symptoms although she is mainly sedentary. -Continue ASA '81mg'$  daily -Continue plavix '75mg'$  daily -Continue lipitor '80mg'$  daily -Continue coreg 12.'5mg'$  BID -Nitro prn  #Chronic Diastolic HF: Currently appears euvolemic. Minimally mobile at this time and is mainly wheelchair bound.  -Continue lasix '40mg'$  daily with extra dose as needed -Continue coreg 12.'5mg'$  BID  #  History of PE/DVT: No longer on xarelto due to anemia. On DAPT. -Continue ASA and plavix -Not on AC due to anemia -Due to limited mobilty, patient is still at high risk of DVT/PE  #HLD: -Continue lipitor '80mg'$  daily  #HTN: Mainly 130s at home. Will try to consolidate medications as much as possible due to pill burden. -Continue coreg 12.'5mg'$  BID -Continue imdur '60mg'$  daily  #DMII on Insulin: A1C 8.5. -Management per PCP -Goal A1C <7  #Anemia: Now on IV iron. Denies bleeding. HgB stable at 9.2. -Management per PCP  #Polypharmacy: Patient has significant pill  burden and she is concerned this is making her feel unwell. Agree that she can likely consolidate some of her pain/neuropathy pills. Will have her follow-up with PCP to discuss further. -Follow-up with PCP  #Diarrhea: #HA: -Follow-up with Primary Care      Medication Adjustments/Labs and Tests Ordered: Current medicines are reviewed at length with the patient today.  Concerns regarding medicines are outlined above.  No orders of the defined types were placed in this encounter.  No orders of the defined types were placed in this encounter.   There are no Patient Instructions on file for this visit.   I,Mykaella Javier,acting as a scribe for Freada Bergeron, MD.,have documented all relevant documentation on the behalf of Freada Bergeron, MD,as directed by  Freada Bergeron, MD while in the presence of Freada Bergeron, MD.  I, Freada Bergeron, MD, have reviewed all documentation for this visit. The documentation on 11/20/21 for the exam, diagnosis, procedures, and orders are all accurate and complete.   Signed, Freada Bergeron, MD  11/20/2021 8:31 PM    [place]

## 2021-11-20 NOTE — Progress Notes (Signed)
  Progress Note   Patient: [name] LKG:[id number] DOB: [birth year] DOA: 11/19/2021     0 DOS: the patient was seen and examined on 11/20/2021   Brief hospital course: 87 y.o. female with medical history significant of hypertension, hyperlipidemia, diastolic heart failure, depression, anxiety, insomnia, gout, CAD status post stent, COPD, OSA, pulmonary embolus, diabetes, CKD, obesity, anemia, GERD, dementia, chronic pain, breast cancer presenting with shortness of breath and chest pressure with associated nausea and weakness. Pt was admitted for concerns of acute heart failure  Assessment and Plan: CHF exacerbation > Patient presenting with some chest pressure and increased dyspnea on exertion (even with minimal exertion and she is wheelchair-bound at baseline).  Also noted to have AKI as below. > Last echo was February of this year with EF 66-44%, grade 1 diastolic dysfunction, normal RV function. > In the ED chest x-ray with possible pulmonary edema, noted to have rales on exam.  BNP was not significantly elevated however due to patient's obesity this could be falsely low.  Does have elevated creatinine as below.  Given her age and clinical picture patient being admitted for observation and IV diuresis. - Improving with IV lasix. Cont to diurese as tolerated -Recheck bmet in AM   AKI on CKD3B > Creatinine in ED elevated 2.04 from baseline around 1.5. > Likely secondary to CHF exacerbation as above - Improving with IV lasix -Recheck bmet in AM  CAD Hyperlipidemia > Status post stenting x4 - Continue home atorvastatin, carvedilol, Imdur, aspirin, plavix -Denies chest pains   Anxiety Depression Insomnia - Continue home Lexapro, hydroxyzine  Dementia - Continue home Namenda  Gout - Continue home Fubuxostat  COPD - Replace home Symbicort with formulary Dulera - Continue home as needed albuterol   Diabetes > On 42 units daily and 14 units with meals at home. - continue 25  units semglee daily - 5 units with meals - SSI  Anemia > Hemoglobin stable at 9.3. - Hemodynamically stable  GERD - Continue home PPI  Chronic pain - Continue home oxycodone  Breast cancer - Follows with oncology - Continue home anastrozole   OSA - On O2 currently  Obesity - Recommend diet/lifestyle modification         Subjective: Reports feeling better today  Physical Exam: Vitals:   11/20/21 0348 11/20/21 0418 11/20/21 0730 11/20/21 1112  BP: 128/76  134/73 (!) 100/42  Pulse: 81  89 74  Resp: '20  18 18  '$ Temp: 98.3 F (36.8 C)  98.4 F (36.9 C) 98.2 F (36.8 C)  TempSrc: Oral  Oral Oral  SpO2: 96%  97% 98%  Weight:  (!) 138.3 kg    Height:  '5\' 7"'$  (1.702 m)     General exam: Awake, laying in bed, in nad Respiratory system: Normal respiratory effort, no wheezing Cardiovascular system: regular rate, s1, s2 Gastrointestinal system: Soft, nondistended, positive BS Central nervous system: CN2-12 grossly intact, strength intact Extremities: Perfused, no clubbing Skin: Normal skin turgor, no notable skin lesions seen Psychiatry: Mood normal // no visual hallucinations   Data Reviewed:  Labs reviewed: Na 136, K 4.5, Cr 1.91, Hgb 9.4   Family Communication: Pt in room, family at bedside  Disposition: Status is: Observation The patient will require care spanning > 2 midnights and should be moved to inpatient because: Severity of illness needing IV lasix  Planned Discharge Destination: Home    Author: Marylu Lund, MD 11/20/2021 3:24 PM  For on call review [email or web address].

## 2021-11-20 NOTE — ED Notes (Signed)
Report given to [employer]

## 2021-11-21 ENCOUNTER — Ambulatory Visit: Payer: HMO | Admitting: Physician Assistant

## 2021-11-21 DIAGNOSIS — J810 Acute pulmonary edema: Secondary | ICD-10-CM

## 2021-11-21 DIAGNOSIS — R079 Chest pain, unspecified: Secondary | ICD-10-CM | POA: Diagnosis not present

## 2021-11-21 DIAGNOSIS — I5033 Acute on chronic diastolic (congestive) heart failure: Secondary | ICD-10-CM | POA: Diagnosis not present

## 2021-11-21 LAB — COMPREHENSIVE METABOLIC PANEL
ALT: 10 U/L (ref 0–44)
AST: 12 U/L — ABNORMAL LOW (ref 15–41)
Albumin: 2.5 g/dL — ABNORMAL LOW (ref 3.5–5.0)
Alkaline Phosphatase: 82 U/L (ref 38–126)
Anion gap: 10 (ref 5–15)
BUN: 52 mg/dL — ABNORMAL HIGH (ref 8–23)
CO2: 25 mmol/L (ref 22–32)
Calcium: 8.1 mg/dL — ABNORMAL LOW (ref 8.9–10.3)
Chloride: 102 mmol/L (ref 98–111)
Creatinine, Ser: 2.07 mg/dL — ABNORMAL HIGH (ref 0.44–1.00)
GFR, Estimated: 23 mL/min — ABNORMAL LOW (ref >60)
Glucose, Bld: 176 mg/dL — ABNORMAL HIGH (ref 70–99)
Potassium: 4.4 mmol/L (ref 3.5–5.1)
Sodium: 137 mmol/L (ref 135–145)
Total Bilirubin: 0.1 mg/dL — ABNORMAL LOW (ref 0.3–1.2)
Total Protein: 6.8 g/dL (ref 6.5–8.1)

## 2021-11-21 LAB — GLUCOSE, CAPILLARY
Glucose-Capillary: 141 mg/dL — ABNORMAL HIGH (ref 70–99)
Glucose-Capillary: 107 mg/dL — ABNORMAL HIGH (ref 70–99)
Glucose-Capillary: 316 mg/dL — ABNORMAL HIGH (ref 70–99)
Glucose-Capillary: 290 mg/dL — ABNORMAL HIGH (ref 70–99)

## 2021-11-21 LAB — CBC
HCT: 28.6 % — ABNORMAL LOW (ref 36.0–46.0)
Hemoglobin: 8.8 g/dL — ABNORMAL LOW (ref 12.0–15.0)
MCH: 28.9 pg (ref 26.0–34.0)
MCHC: 30.8 g/dL (ref 30.0–36.0)
MCV: 94.1 fL (ref 80.0–100.0)
Platelets: 206 10*3/uL (ref 150–400)
RBC: 3.04 MIL/uL — ABNORMAL LOW (ref 3.87–5.11)
RDW: 13.9 % (ref 11.5–15.5)
WBC: 11.6 10*3/uL — ABNORMAL HIGH (ref 4.0–10.5)
nRBC: 0 % (ref 0.0–0.2)

## 2021-11-21 MED ORDER — ENOXAPARIN SODIUM 30 MG/0.3ML IJ SOSY
30.0000 mg | PREFILLED_SYRINGE | INTRAMUSCULAR | Status: DC
  Administered 2021-11-22 – 2021-11-26 (×5): 30 mg via SUBCUTANEOUS
  Filled 2021-11-21 (×5): qty 0.3

## 2021-11-21 NOTE — Progress Notes (Signed)
Progress Note   Patient: [name] WJX:[id number] DOB: [birth year] DOA: 11/19/2021     1 DOS: the patient was seen and examined on 11/21/2021   Brief hospital course: 87 y.o. female with medical history significant of hypertension, hyperlipidemia, diastolic heart failure, depression, anxiety, insomnia, gout, CAD status post stent, COPD, OSA, pulmonary embolus, diabetes, CKD, obesity, anemia, GERD, dementia, chronic pain, breast cancer presenting with shortness of breath and chest pressure with associated nausea and weakness. Pt was admitted for concerns of acute heart failure  Assessment and Plan: CHF exacerbation > Patient presenting with some chest pressure and increased dyspnea on exertion (even with minimal exertion and she is wheelchair-bound at baseline).  Also noted to have AKI as below. > Last echo was February of this year with EF 21-30%, grade 1 diastolic dysfunction, normal RV function. > In the ED chest x-ray with possible pulmonary edema, noted to have rales on exam.  BNP was not significantly elevated however due to patient's obesity this could be falsely low.  -Was given IV lasix at time of presentation with good diuresis -Cr now up to 2.07 with elevated BUN -Dry wt at recent discharge of 142kg, today 139.6kg -appears more weak on exam. Complains of dry mouth. Will hold further lasix for now -Recheck bmet in AM   AKI on CKD3B > Creatinine in ED elevated 2.04 from baseline around 1.5. > Likely secondary to CHF exacerbation as above - Initially improved with IV lasix, now up to over 2. Lasix now on hold -Recheck bmet in AM  CAD Hyperlipidemia > Status post stenting x4 - Continue home atorvastatin, carvedilol, Imdur, aspirin, plavix -Denies chest pains   Anxiety Depression Insomnia - Continue home Lexapro, hydroxyzine  Dementia - Continue home Namenda  Gout - Continue home Fubuxostat  COPD - Replace home Symbicort with formulary Dulera - Continue home as  needed albuterol   Diabetes > On 42 units daily and 14 units with meals at home. - continue 25 units semglee daily - 5 units with meals - Cont SSI  Anemia > Hemoglobin stable at 9.3. - Hemodynamically stable  GERD - Continue home PPI  Chronic pain - Continue home oxycodone  Breast cancer - Follows with oncology - Continue home anastrozole   OSA - On O2 currently  Obesity - Recommend diet/lifestyle modification       Subjective:States feeling eager to go home. Family reports pt seems more tired  Physical Exam: Vitals:   11/21/21 0706 11/21/21 0850 11/21/21 1119 11/21/21 1505  BP: 140/70  (!) 105/59 (!) 110/57  Pulse: 75  78 85  Resp: '19  18 19  '$ Temp: (!) 97.5 F (36.4 C)  (!) 97.5 F (36.4 C) 97.8 F (36.6 C)  TempSrc: Oral  Oral Oral  SpO2: 95% 94% 97% 94%  Weight:      Height:       General exam: Conversant, in no acute distress Respiratory system: normal chest rise, clear, no audible wheezing Cardiovascular system: regular rhythm, s1-s2 Gastrointestinal system: Nondistended, nontender, pos BS Central nervous system: No seizures, no tremors Extremities: No cyanosis, no joint deformities Skin: No rashes, no pallor Psychiatry: Affect normal // no auditory hallucinations   Data Reviewed:  Labs reviewed: Na 137, K 4.4, Cr 2.07, Hgb 8.8   Family Communication: Pt in room, family at bedside  Disposition: Status is: Inpatient Continue inpatient stay because: Severity of illness needing IV lasix  Planned Discharge Destination: Home    Author: Marylu Lund, MD 11/21/2021 4:31 PM  For on call review [email or web address].

## 2021-11-21 NOTE — Progress Notes (Signed)
Heart Failure Navigator Progress Note  Assessed for Heart & Vascular TOC clinic readiness.  Patient does not meet criteria due to EF 55-60%, Hx of dementia per MD notes.   Navigator available for reassessment of patient.   Earnestine Leys, BSN, Clinical cytogeneticist Only

## 2021-11-22 ENCOUNTER — Ambulatory Visit: Payer: PPO | Admitting: Cardiology

## 2021-11-22 DIAGNOSIS — J449 Chronic obstructive pulmonary disease, unspecified: Secondary | ICD-10-CM | POA: Diagnosis not present

## 2021-11-22 DIAGNOSIS — N179 Acute kidney failure, unspecified: Secondary | ICD-10-CM | POA: Diagnosis not present

## 2021-11-22 DIAGNOSIS — E1169 Type 2 diabetes mellitus with other specified complication: Secondary | ICD-10-CM

## 2021-11-22 DIAGNOSIS — I5033 Acute on chronic diastolic (congestive) heart failure: Secondary | ICD-10-CM | POA: Diagnosis not present

## 2021-11-22 DIAGNOSIS — I10 Essential (primary) hypertension: Secondary | ICD-10-CM | POA: Diagnosis not present

## 2021-11-22 LAB — COMPREHENSIVE METABOLIC PANEL
ALT: 11 U/L (ref 0–44)
AST: 11 U/L — ABNORMAL LOW (ref 15–41)
Albumin: 2.4 g/dL — ABNORMAL LOW (ref 3.5–5.0)
Alkaline Phosphatase: 73 U/L (ref 38–126)
Anion gap: 5 (ref 5–15)
BUN: 58 mg/dL — ABNORMAL HIGH (ref 8–23)
CO2: 25 mmol/L (ref 22–32)
Calcium: 7.7 mg/dL — ABNORMAL LOW (ref 8.9–10.3)
Chloride: 108 mmol/L (ref 98–111)
Creatinine, Ser: 2.13 mg/dL — ABNORMAL HIGH (ref 0.44–1.00)
GFR, Estimated: 22 mL/min — ABNORMAL LOW (ref >60)
Glucose, Bld: 219 mg/dL — ABNORMAL HIGH (ref 70–99)
Potassium: 4.8 mmol/L (ref 3.5–5.1)
Sodium: 138 mmol/L (ref 135–145)
Total Bilirubin: 0.5 mg/dL (ref 0.3–1.2)
Total Protein: 6.4 g/dL — ABNORMAL LOW (ref 6.5–8.1)

## 2021-11-22 LAB — CBC
HCT: 27.7 % — ABNORMAL LOW (ref 36.0–46.0)
Hemoglobin: 8.7 g/dL — ABNORMAL LOW (ref 12.0–15.0)
MCH: 29.3 pg (ref 26.0–34.0)
MCHC: 31.4 g/dL (ref 30.0–36.0)
MCV: 93.3 fL (ref 80.0–100.0)
Platelets: 192 10*3/uL (ref 150–400)
RBC: 2.97 MIL/uL — ABNORMAL LOW (ref 3.87–5.11)
RDW: 14 % (ref 11.5–15.5)
WBC: 12.4 10*3/uL — ABNORMAL HIGH (ref 4.0–10.5)
nRBC: 0 % (ref 0.0–0.2)

## 2021-11-22 LAB — GLUCOSE, CAPILLARY
Glucose-Capillary: 188 mg/dL — ABNORMAL HIGH (ref 70–99)
Glucose-Capillary: 138 mg/dL — ABNORMAL HIGH (ref 70–99)
Glucose-Capillary: 239 mg/dL — ABNORMAL HIGH (ref 70–99)
Glucose-Capillary: 259 mg/dL — ABNORMAL HIGH (ref 70–99)

## 2021-11-22 MED ORDER — FUROSEMIDE 10 MG/ML IJ SOLN
60.0000 mg | Freq: Two times a day (BID) | INTRAMUSCULAR | Status: DC
  Administered 2021-11-22 – 2021-11-23 (×2): 60 mg via INTRAVENOUS
  Filled 2021-11-22 (×2): qty 6

## 2021-11-22 MED ORDER — MICONAZOLE NITRATE 2 % EX CREA
TOPICAL_CREAM | Freq: Two times a day (BID) | CUTANEOUS | Status: DC
  Filled 2021-11-22 (×2): qty 28.4

## 2021-11-22 NOTE — Assessment & Plan Note (Addendum)
Hyperglycemia   Fasting glucose is 219 today, capillary is 316, 290 and 188.  Plan to continue with insulin sliding scale for glucose cover and monitoring. Basal insulin 25  Units and pre meal 5 units of short acting insulin.  Considering not stable GFR will continue current dose of insulin, patient with high risk for hypoglycemia.   Continue with statin therapy.

## 2021-11-22 NOTE — Assessment & Plan Note (Signed)
Continue blood pressure control with carvedilol and isosorbide Continue diuresis with IV furosemide.

## 2021-11-22 NOTE — Inpatient Diabetes Management (Signed)
Inpatient Diabetes Program Recommendations  AACE/ADA: New Consensus Statement on Inpatient Glycemic Control (2015)  Target Ranges:  Prepandial:   less than 140 mg/dL      Peak postprandial:   less than 180 mg/dL (1-2 hours)      Critically ill patients:  140 - 180 mg/dL   Lab Results  Component Value Date   GLUCAP 188 (H) 11/22/2021   HGBA1C 7.0 (H) 07/04/2021    Review of Glycemic Control  Latest Reference Range & Units 11/21/21 16:11 11/21/21 21:26 11/22/21 06:20  Glucose-Capillary 70 - 99 mg/dL 316 (H) 290 (H) 188 (H)  (H): Data is abnormally high Diabetes history: DM2 Outpatient Diabetes medications: Toujeo 42 units QAM, Novolog 14 units TID  Current orders for Inpatient glycemic control: Semglee 25 units QD, Novolog 5 units TID, Novolog 0-15 units tid & hs   If to remain inpatient, consider increasing Semglee to 30 units QD.   Thanks, Bronson Curb, MSN, RNC-OB Diabetes Coordinator [phone] (8a-5p)

## 2021-11-22 NOTE — Assessment & Plan Note (Signed)
hgb has been stable.

## 2021-11-22 NOTE — Assessment & Plan Note (Addendum)
Follow up as outpatient.  Continue with anastrazole.

## 2021-11-22 NOTE — Progress Notes (Signed)
Reported by NT that pt. complained of itchiness on the perianal area. MD made aware with order. Continue to monitor.

## 2021-11-22 NOTE — Assessment & Plan Note (Signed)
Continue with escitalopram, and hydroxyzine.  On memantine.

## 2021-11-22 NOTE — Assessment & Plan Note (Signed)
CKD stage 3b hypomagenesemis   Renal function with serum cr at 2,13 with K at 3,8 and serum bicarbonate at 25.  Continue to have volume overload  Plan to resume diuresis with furosemide 60 mg IV q12 hrs Follow up renal function in am, avoid hypotension and nephrotoxic medications   Check Mg in am.

## 2021-11-22 NOTE — Assessment & Plan Note (Addendum)
Echocardiogram 06/2021 with preserved LV systolic function with EF 55 to 60%, preserved RV systolic function, no significant valvular disease.   Urine output documented 915 cc Systolic blood pressure 056 to 105 mmHg.  Portable ultrasound was used to evaluate: Other pulmonary edmea and heart function  Notes B lines at bases consistent with persistent pulmonary edema. Heart function with preserved LV systolic function.   Plan  Resume diuresis with furosemide 60 mg IV q12.  Hold on further RAS inhibition due to reduced GFR.  Continue blood pressure control with isosorbide and carvedilol.  Coronary artery disease. Continue with antiplatelet therapy, and statin.

## 2021-11-22 NOTE — Assessment & Plan Note (Signed)
Calculated BMI is 48,1  OSA Continue with cpap.

## 2021-11-22 NOTE — Progress Notes (Signed)
Progress Note   Patient: [name] DOB: 1934-12-02 DOA: 11/19/2021     2 DOS: the patient was seen and examined on 11/22/2021   Brief hospital course: [name] was admitted to the hospital with the working diagnosis of decompensated heart failure.   87 y.o. female with medical history significant of hypertension, hyperlipidemia, diastolic heart failure, depression, anxiety, insomnia, gout, CAD status post stent, COPD, OSA, pulmonary embolus, diabetes, CKD, obesity, anemia, GERD, dementia, chronic pain, breast cancer presenting with shortness of breath and chest pressure with associated nausea and weakness. Reported 2 days of symptoms, persistent and prompting her to [facility]. She is wheelchair bound. On her initial physical examination her blood pressure was 130 to 379 mmHg systolic, HR 72, RR 18 and 02 saturation 96% on room air. Lungs with bilateral wheezing and rales, heart with S1 and S2 present and rhythmic with no gallops, rubs or murmurs, abdomen not distended and positive lower extremity edema.   Na 141, K 4.2 CL 103, bicarbonate 23, glucose 242, bun 45 cr 2,0  Wbc 10,4 hgb 9,3 plt 225   Chest radiograph with cardiomegaly, bilateral hilar vascular congestion with cephalization of the vasculature, mild cardiomegaly, no effusions. Left rotation.   68 bpm, left axis, left anterior fascicular block, normal intervals, sinus rhythm with q waves lead II, III, AcV, V3 to V6, no significant ST segment or T wave changes.   Patient was placed on furosemide for diuresis.   07/18 continue volume overloaded.   Assessment and Plan: * Acute on chronic diastolic (congestive) heart failure (HCC) Echocardiogram 06/2021 with preserved LV systolic function with EF 55 to 60%, preserved RV systolic function, no significant valvular disease.   Urine output documented 024 cc Systolic blood pressure 097 to 105 mmHg.  Portable ultrasound was used to evaluate: Other pulmonary edmea and  heart function  Notes B lines at bases consistent with persistent pulmonary edema. Heart function with preserved LV systolic function.   Plan  Resume diuresis with furosemide 60 mg IV q12.  Hold on further RAS inhibition due to reduced GFR.  Continue blood pressure control with isosorbide and carvedilol.  Coronary artery disease. Continue with antiplatelet therapy, and statin.    AKI (acute kidney injury) ([name]) CKD stage 3b hypomagenesemis   Renal function with serum cr at 2,13 with K at 3,8 and serum bicarbonate at 25.  Continue to have volume overload  Plan to resume diuresis with furosemide 60 mg IV q12 hrs Follow up renal function in am, avoid hypotension and nephrotoxic medications   Check Mg in am.   Essential hypertension Continue blood pressure control with carvedilol and isosorbide Continue diuresis with IV furosemide.   COPD with chronic bronchitis ([place]) No clinical signs of exacerbation.   Type 2 diabetes mellitus with hyperlipidemia (HCC) Hyperglycemia   Fasting glucose is 219 today, capillary is 316, 290 and 188.  Plan to continue with insulin sliding scale for glucose cover and monitoring. Basal insulin 25  Units and pre meal 5 units of short acting insulin.  Considering not stable GFR will continue current dose of insulin, patient with high risk for hypoglycemia.   Continue with statin therapy.   Microcytic anemia hgb has been stable.   Anxiety Continue with escitalopram, and hydroxyzine.  On memantine.   Breast cancer ([place]) Follow up as outpatient.  Continue with anastrazole.   Class 3 obesity (HCC) Calculated BMI is 48,1  OSA Continue with cpap.  Subjective: Patient continue with fatigue and dyspnea, continue with cough, no chest pain   Physical Exam: Vitals:   11/21/21 1952 11/21/21 2329 11/22/21 0329 11/22/21 0732  BP:  (!) 116/41 (!) 110/56 (!) 105/46  Pulse:  85 79 75  Resp:  '19 15 20  '$ Temp:  98.1 F (36.7 C) 98.1 F  (36.7 C)   TempSrc:  Oral Oral   SpO2: 96% 97% 96% 97%  Weight:   (!) 139.4 kg   Height:       Neurology awake and alert ENT with mild pallor Cardiovascular with S1 and S2 present and rhythmic with no gallops or murmurs Mild JVD Trace pitting lower extremity edema Respiratory with rales at bases but no wheezing Abdomen not distended but protuberant  Data Reviewed:    Family Communication: I spoke with patient's daughter at the bedside, we talked in detail about patient's condition, plan of care and prognosis and all questions were addressed.   Disposition: Status is: Inpatient Remains inpatient appropriate because: heart failure   Planned Discharge Destination: Home  Author: Tawni Millers, MD 11/22/2021 11:59 AM  For on call review [email or web address].

## 2021-11-22 NOTE — Assessment & Plan Note (Signed)
No clinical signs of exacerbation  

## 2021-11-23 DIAGNOSIS — I5033 Acute on chronic diastolic (congestive) heart failure: Secondary | ICD-10-CM | POA: Diagnosis not present

## 2021-11-23 LAB — BASIC METABOLIC PANEL
Anion gap: 9 (ref 5–15)
BUN: 71 mg/dL — ABNORMAL HIGH (ref 8–23)
CO2: 25 mmol/L (ref 22–32)
Calcium: 8 mg/dL — ABNORMAL LOW (ref 8.9–10.3)
Chloride: 103 mmol/L (ref 98–111)
Creatinine, Ser: 2.7 mg/dL — ABNORMAL HIGH (ref 0.44–1.00)
GFR, Estimated: 17 mL/min — ABNORMAL LOW (ref >60)
Glucose, Bld: 223 mg/dL — ABNORMAL HIGH (ref 70–99)
Potassium: 5.1 mmol/L (ref 3.5–5.1)
Sodium: 137 mmol/L (ref 135–145)

## 2021-11-23 LAB — GLUCOSE, CAPILLARY
Glucose-Capillary: 168 mg/dL — ABNORMAL HIGH (ref 70–99)
Glucose-Capillary: 224 mg/dL — ABNORMAL HIGH (ref 70–99)
Glucose-Capillary: 273 mg/dL — ABNORMAL HIGH (ref 70–99)
Glucose-Capillary: 332 mg/dL — ABNORMAL HIGH (ref 70–99)

## 2021-11-23 LAB — MAGNESIUM: Magnesium: 1.3 mg/dL — ABNORMAL LOW (ref 1.7–2.4)

## 2021-11-23 MED ORDER — ISOSORBIDE MONONITRATE ER 30 MG PO TB24
15.0000 mg | ORAL_TABLET | Freq: Every day | ORAL | Status: DC
  Administered 2021-11-23: 15 mg via ORAL
  Filled 2021-11-23: qty 1

## 2021-11-23 MED ORDER — ORAL CARE MOUTH RINSE: 15.0000 mL | OROMUCOSAL | Status: DC | PRN

## 2021-11-23 NOTE — Care Management Important Message (Signed)
Important Message  Patient Details  Name: [name] MRN: [id number] Date of Birth: 1935-02-01   Medicare Important Message Given:  Yes     Orbie Pyo 11/23/2021, 11:27 AM

## 2021-11-23 NOTE — Progress Notes (Signed)
Mobility Specialist Progress Note    11/23/21 1600  Mobility  Activity Transferred from chair to bed  Level of Assistance Maximum assist, patient does 25-49%  Assistive Device Front wheel walker  Activity Response Tolerated fair  $Mobility charge 1 Mobility   Pt received and agreeable. Maintained trunk flexion and not able to extend. Left with call bell in reach and NT and family present.   Hildred Alamin Mobility Specialist

## 2021-11-23 NOTE — Plan of Care (Signed)
  Problem: Fluid Volume: Goal: Ability to maintain a balanced intake and output will improve Outcome: Progressing   Problem: Clinical Measurements: Goal: Respiratory complications will improve Outcome: Progressing   Problem: Activity: Goal: Capacity to carry out activities will improve Outcome: Not Progressing   Problem: Nutritional: Goal: Maintenance of adequate nutrition will improve Outcome: Not Progressing   Problem: Activity: Goal: Risk for activity intolerance will decrease Outcome: Not Progressing   Problem: Coping: Goal: Level of anxiety will decrease Outcome: Not Progressing

## 2021-11-23 NOTE — Progress Notes (Signed)
PROGRESS NOTE    [name]  DQQ:[id number] DOB: Sep 18, 1934 DOA: 11/19/2021 PCP: Bonnita Hollow, MD  86/F chronically ill morbidly obese female with history of chronic diastolic CHF, CAD,  NSTEMI 02/2020 s/p DES to RCA), COPD, CKD stage IIIa, IDDM, HTN, HLD, anemia of chronic disease, prior PE/DVT, left breast cancer, depression/anxiety, chronic pain, memory deficits who presented to the ED for evaluation of shortness of breath.  She is debilitated and wheelchair-bound at baseline, spends most of her time in bed, able to transfer to bedside commode for BMs. -In the ED vitals were stable, lungs with bilateral wheezing and rails and positive lower extremity edema, chest x-ray noted cardiomegaly, pulmonary vascular congestion, creatinine was 2.0 on admission higher than baseline -Improving on diuretics, unfortunately creatinine has trended up to 2.7   Subjective: -Feels better, breathing is improving, granddaughter is at bedside  Assessment and Plan:  Acute on chronic diastolic CHF  -Compliant with diuretics at baseline -Recent TTE 07/05/2021 showed EF 55-60%, G1DD. -She is> 2 L negative, weight appears unreliable -Unfortunately creatinine has bumped to 2.7 from baseline around 1.6, we will hold diuretics today,  AKI on CKD3b -Suspect hemodynamically mediated -Baseline creatinine around 1.6, creatinine was 2.0 on admission, this has trended up to 2.7 today -Hold diuretics, avoid hypotension we will decrease Imdur dose -Monitor urine output, BMP in a.m.  Morbid obesity Debility, wheelchair-bound status -Poor prognostic factors, will request PT eval   Coronary artery disease of native artery of native heart with stable angina pectoris (HCC) -Stable, continue  aspirin, Plavix, atorvastatin, Imdur.   COPD with chronic bronchitis (HCC) -Stable, continue DuoNebs, albuterol   Type 2 diabetes mellitus with diabetic neuropathy, with long-term current use of insulin (HCC) -CBGs are  elevated, increase glargine dose   Essential hypertension Continue home Coreg, Imdur.   Anxiety associated with depression Continue home Cymbalta.   Chronic pain syndrome Continue Percocet q12h as needed.   Dyslipidemia associated with type 2 diabetes mellitus (HCC) Continue atorvastatin.   Mild memory deficits -Continue Namenda she is AAOx3   DVT prophylaxis: Lovenox Code Status: DNR Family Communication: Discussed with patient and granddaughter at bedside Disposition Plan: Home likely in 2 to 3 days pending improvement in kidney function  Consultants:    Procedures:   Antimicrobials:    Objective: Vitals:   11/23/21 0302 11/23/21 0500 11/23/21 0745 11/23/21 0806  BP: (!) 104/54   (!) 97/49  Pulse: 78  77 76  Resp:   15 15  Temp: 99.1 F (37.3 C)   98.6 F (37 C)  TempSrc: Axillary   Oral  SpO2: 98%  99% 99%  Weight:  (!) 140.9 kg    Height:        Intake/Output Summary (Last 24 hours) at 11/23/2021 1118 Last data filed at 11/23/2021 1941 Gross per 24 hour  Intake 700 ml  Output 1100 ml  Net -400 ml   Filed Weights   11/21/21 0422 11/22/21 0329 11/23/21 0500  Weight: (!) 139.6 kg (!) 139.4 kg (!) 140.9 kg    Examination:  General exam: Appears calm and comfortable  Respiratory system: Clear to auscultation Cardiovascular system: S1 & S2 heard, RRR.  Abd: nondistended, soft and nontender.Normal bowel sounds heard. Central nervous system: Alert and oriented. No focal neurological deficits. Extremities: no edema Skin: No rashes Psychiatry:  Mood & affect appropriate.     Data Reviewed:   CBC: Recent Labs  Lab 11/19/21 1611 11/20/21 0442 11/21/21 0047 11/22/21 0102  WBC 10.4  13.2* 11.6* 12.4*  NEUTROABS 6.7  --   --   --   HGB 9.3* 9.4* 8.8* 8.7*  HCT 30.4* 29.9* 28.6* 27.7*  MCV 95.0 92.9 94.1 93.3  PLT 225 221 206 921   Basic Metabolic Panel: Recent Labs  Lab 11/19/21 1611 11/20/21 0442 11/21/21 0047 11/22/21 0705 11/23/21 0051   NA 141 136 137 138 137  K 4.2 4.5 4.4 4.8 5.1  CL 103 104 102 108 103  CO2 '23 23 25 25 25  '$ GLUCOSE 242* 241* 176* 219* 223*  BUN 45* 46* 52* 58* 71*  CREATININE 2.04* 1.91* 2.07* 2.13* 2.70*  CALCIUM 8.4* 8.1* 8.1* 7.7* 8.0*  MG  --  1.2*  --   --  1.3*   GFR: Estimated Creatinine Clearance: 22.4 mL/min (A) (by C-G formula based on SCr of 2.7 mg/dL (H)). Liver Function Tests: Recent Labs  Lab 11/19/21 1611 11/21/21 0047 11/22/21 0705  AST 19 12* 11*  ALT '12 10 11  '$ ALKPHOS 82 82 73  BILITOT 0.3 0.1* 0.5  PROT 7.0 6.8 6.4*  ALBUMIN 2.7* 2.5* 2.4*   Recent Labs  Lab 11/19/21 1611  LIPASE 30   No results for input(s): "AMMONIA" in the last 168 hours. Coagulation Profile: No results for input(s): "INR", "PROTIME" in the last 168 hours. Cardiac Enzymes: No results for input(s): "CKTOTAL", "CKMB", "CKMBINDEX", "TROPONINI" in the last 168 hours. BNP (last 3 results) No results for input(s): "PROBNP" in the last 8760 hours. HbA1C: No results for input(s): "HGBA1C" in the last 72 hours. CBG: Recent Labs  Lab 11/22/21 0620 11/22/21 1210 11/22/21 1640 11/22/21 2108 11/23/21 0613  GLUCAP 188* 138* 239* 259* 168*   Lipid Profile: No results for input(s): "CHOL", "HDL", "LDLCALC", "TRIG", "CHOLHDL", "LDLDIRECT" in the last 72 hours. Thyroid Function Tests: No results for input(s): "TSH", "T4TOTAL", "FREET4", "T3FREE", "THYROIDAB" in the last 72 hours. Anemia Panel: No results for input(s): "VITAMINB12", "FOLATE", "FERRITIN", "TIBC", "IRON", "RETICCTPCT" in the last 72 hours. Urine analysis:    Component Value Date/Time   COLORURINE YELLOW 10/13/2021 1908   APPEARANCEUR HAZY (A) 10/13/2021 1908   LABSPEC 1.016 10/13/2021 1908   PHURINE 5.0 10/13/2021 1908   GLUCOSEU NEGATIVE 10/13/2021 1908   HGBUR NEGATIVE 10/13/2021 1908   BILIRUBINUR NEGATIVE 10/13/2021 1908   BILIRUBINUR neg 06/03/2019 1116   KETONESUR NEGATIVE 10/13/2021 1908   PROTEINUR 100 (A) 10/13/2021  1908   UROBILINOGEN 0.2 06/03/2019 1116   UROBILINOGEN 0.2 02/16/2018 1514   NITRITE NEGATIVE 10/13/2021 1908   LEUKOCYTESUR SMALL (A) 10/13/2021 1908   Sepsis Labs: '@LABRCNTIP'$ (procalcitonin:4,lacticidven:4)  ) Recent Results (from the past 240 hour(s))  MRSA Next Gen by PCR, Nasal     Status: None   Collection Time: 11/20/21  3:53 AM   Specimen: Nasal Mucosa; Nasal Swab  Result Value Ref Range Status   MRSA by PCR Next Gen NOT DETECTED NOT DETECTED Final    Comment: (NOTE) The GeneXpert MRSA Assay (FDA approved for NASAL specimens only), is one component of a comprehensive MRSA colonization surveillance program. It is not intended to diagnose MRSA infection nor to guide or monitor treatment for MRSA infections. Test performance is not FDA approved in patients less than 36 years old. Performed at [facility], [place] [address], [place], [place] [address]      Radiology Studies: No results found.   Scheduled Meds:  anastrozole  1 mg Oral Daily   aspirin EC  81 mg Oral Daily   atorvastatin  80  mg Oral Daily   carvedilol  12.5 mg Oral BID   clopidogrel  75 mg Oral Daily   enoxaparin (LOVENOX) injection  30 mg Subcutaneous Q24H   escitalopram  10 mg Oral Daily   febuxostat  40 mg Oral Daily   feeding supplement (GLUCERNA SHAKE)  237 mL Oral TID BM   insulin aspart  0-15 Units Subcutaneous TID WC   insulin aspart  0-5 Units Subcutaneous QHS   insulin aspart  5 Units Subcutaneous TID WC   insulin glargine-yfgn  25 Units Subcutaneous Daily   isosorbide mononitrate  15 mg Oral Daily   memantine  10 mg Oral Daily   memantine  5 mg Oral QHS   miconazole   Topical BID   mometasone-formoterol  2 puff Inhalation BID   pantoprazole  20 mg Oral Daily   sodium chloride flush  3 mL Intravenous Q12H   Continuous Infusions:   LOS: 3 days    Time spent:     Domenic Polite, MD [facility]   11/23/2021, 11:18 AM

## 2021-11-23 NOTE — Plan of Care (Signed)
  Problem: Education: Goal: Ability to demonstrate management of disease process will improve Outcome: Progressing Goal: Ability to verbalize understanding of medication therapies will improve Outcome: Progressing Goal: Individualized Educational Video(s) Outcome: Progressing   Problem: Cardiac: Goal: Ability to achieve and maintain adequate cardiopulmonary perfusion will improve Outcome: Progressing   Problem: Education: Goal: Ability to describe self-care measures that may prevent or decrease complications (Diabetes Survival Skills Education) will improve Outcome: Progressing   Problem: Coping: Goal: Ability to adjust to condition or change in health will improve Outcome: Progressing

## 2021-11-24 ENCOUNTER — Other Ambulatory Visit: Payer: Self-pay | Admitting: Nurse Practitioner

## 2021-11-24 DIAGNOSIS — I5033 Acute on chronic diastolic (congestive) heart failure: Secondary | ICD-10-CM | POA: Diagnosis not present

## 2021-11-24 DIAGNOSIS — F419 Anxiety disorder, unspecified: Secondary | ICD-10-CM

## 2021-11-24 LAB — GLUCOSE, CAPILLARY
Glucose-Capillary: 197 mg/dL — ABNORMAL HIGH (ref 70–99)
Glucose-Capillary: 263 mg/dL — ABNORMAL HIGH (ref 70–99)
Glucose-Capillary: 317 mg/dL — ABNORMAL HIGH (ref 70–99)
Glucose-Capillary: 276 mg/dL — ABNORMAL HIGH (ref 70–99)

## 2021-11-24 LAB — CBC
HCT: 27.1 % — ABNORMAL LOW (ref 36.0–46.0)
Hemoglobin: 8.5 g/dL — ABNORMAL LOW (ref 12.0–15.0)
MCH: 29.2 pg (ref 26.0–34.0)
MCHC: 31.4 g/dL (ref 30.0–36.0)
MCV: 93.1 fL (ref 80.0–100.0)
Platelets: 177 10*3/uL (ref 150–400)
RBC: 2.91 MIL/uL — ABNORMAL LOW (ref 3.87–5.11)
RDW: 13.8 % (ref 11.5–15.5)
WBC: 13.2 10*3/uL — ABNORMAL HIGH (ref 4.0–10.5)
nRBC: 0 % (ref 0.0–0.2)

## 2021-11-24 LAB — BASIC METABOLIC PANEL
Anion gap: 10 (ref 5–15)
BUN: 71 mg/dL — ABNORMAL HIGH (ref 8–23)
CO2: 23 mmol/L (ref 22–32)
Calcium: 8.2 mg/dL — ABNORMAL LOW (ref 8.9–10.3)
Chloride: 104 mmol/L (ref 98–111)
Creatinine, Ser: 2.48 mg/dL — ABNORMAL HIGH (ref 0.44–1.00)
GFR, Estimated: 19 mL/min — ABNORMAL LOW (ref >60)
Glucose, Bld: 243 mg/dL — ABNORMAL HIGH (ref 70–99)
Potassium: 4.9 mmol/L (ref 3.5–5.1)
Sodium: 137 mmol/L (ref 135–145)

## 2021-11-24 MED ORDER — SENNOSIDES-DOCUSATE SODIUM 8.6-50 MG PO TABS
1.0000 | ORAL_TABLET | Freq: Two times a day (BID) | ORAL | Status: DC
  Administered 2021-11-24 – 2021-11-26 (×4): 1 via ORAL
  Filled 2021-11-24 (×4): qty 1

## 2021-11-24 MED ORDER — INSULIN ASPART 100 UNIT/ML IJ SOLN
6.0000 [IU] | Freq: Three times a day (TID) | INTRAMUSCULAR | Status: DC
  Administered 2021-11-24 – 2021-11-26 (×6): 6 [IU] via SUBCUTANEOUS

## 2021-11-24 MED ORDER — INSULIN GLARGINE-YFGN 100 UNIT/ML ~~LOC~~ SOLN
30.0000 [IU] | Freq: Every day | SUBCUTANEOUS | Status: DC
  Administered 2021-11-24: 30 [IU] via SUBCUTANEOUS
  Filled 2021-11-24 (×2): qty 0.3

## 2021-11-24 MED ORDER — POLYETHYLENE GLYCOL 3350 17 G PO PACK
17.0000 g | PACK | Freq: Two times a day (BID) | ORAL | Status: DC
  Administered 2021-11-24: 17 g via ORAL
  Filled 2021-11-24 (×4): qty 1

## 2021-11-24 NOTE — Evaluation (Signed)
Occupational Therapy Evaluation Patient Details Name: [name] MRN: [id number] DOB: Jun 26, 1934 Today's Date: 11/24/2021   History of Present Illness 87 y.o. female admitted 7/15 with acute CHF. PMHx: obesity, chronic HFpEF, CAD, NSTEMI, COPD, CKD, IDDM, HTN, HLD, anemia of chronic disease, left breast CA, depression/anxiety, chronic pain, memory deficits   Clinical Impression   This 87 yo female admitted with above presents to acute OT with PLOF of being able to mostly do transfers with +1 A but not ambulating.Needing A of aide 3 times a week for bathing and dressing. Family lives with her but they have their own medical issues per sister from [place] that is in the room with patient. Pt would be best suited for ALF but does not have the funds to do this. Currently we are recommending HHOT since pt does have the potential to do more for herself. We will continue to follow.     Recommendations for follow up therapy are one component of a multi-disciplinary discharge planning process, led by the attending physician.  Recommendations may be updated based on patient status, additional functional criteria and insurance authorization.   Follow Up Recommendations  Home health OT    Assistance Recommended at Discharge Frequent or constant Supervision/Assistance  Patient can return home with the following A lot of help with bathing/dressing/bathroom;Help with stairs or ramp for entrance;Assist for transportation;Assistance with cooking/housework    Functional Status Assessment  Patient has had a recent decline in their functional status and demonstrates the ability to make significant improvements in function in a reasonable and predictable amount of time.  Equipment Recommendations  Hospital bed       Precautions / Restrictions Precautions Precautions: Fall Restrictions Weight Bearing Restrictions: No      Mobility Bed Mobility Overal bed mobility: Needs Assistance Bed Mobility:  Supine to Sit     Supine to sit: Mod assist     General bed mobility comments: Pt can get her legs 75% off of the bed, then needs min A to come up with trunk from the back and A to scoot forward with bed pad to EOB    Transfers Overall transfer level: Needs assistance Equipment used: None Transfers: Sit to/from Stand, Bed to chair/wheelchair/BSC Sit to Stand: Min assist, Mod assist, +2 physical assistance, +2 safety/equipment     Step pivot transfers: Min assist, Mod assist, +2 physical assistance, +2 safety/equipment     General transfer comment: Pt needing Mod A +2 safety/A for sit>stand from EOB (intial standing for the day) and A to maintain standing wtih a posterior lean inititally. Sit>stand from 3n1 was min A +2 safety and A      Balance Overall balance assessment: Needs assistance Sitting-balance support: No upper extremity supported, Feet supported Sitting balance-Leahy Scale: Good   Postural control: Posterior lean (initially in standing the first time) Standing balance support: Bilateral upper extremity supported Standing balance-Leahy Scale: Poor Standing balance comment: Reliant on UE support and up to minA                           ADL either performed or assessed with clinical judgement   ADL Overall ADL's : Needs assistance/impaired Eating/Feeding: Independent;Sitting Eating/Feeding Details (indicate cue type and reason): in recliner Grooming: Set up;Sitting Grooming Details (indicate cue type and reason): in recliner Upper Body Bathing: Maximal assistance;Bed level   Lower Body Bathing: Total assistance;Bed level   Upper Body Dressing : Minimal assistance;Sitting Upper Body Dressing  Details (indicate cue type and reason): in recliner for a shirt Lower Body Dressing: Total assistance;Bed level Lower Body Dressing Details (indicate cue type and reason): or min -mod A sit<>stand Toilet Transfer: Minimal assistance;+2 for physical assistance;+2  for safety/equipment;Stand-pivot;BSC/3in1   Toileting- Clothing Manipulation and Hygiene: Total assistance Toileting - Clothing Manipulation Details (indicate cue type and reason): min A +2 safety sit<>stand and maintain standing             Vision Patient Visual Report: No change from baseline              Pertinent Vitals/Pain Pain Assessment Pain Assessment: Faces Faces Pain Scale: Hurts little more Pain Location: left shoulder Pain Descriptors / Indicators: Discomfort, Grimacing (when moving left shoulder flexion) Pain Intervention(s): Limited activity within patient's tolerance, Monitored during session, Premedicated before session     Hand Dominance Right   Extremity/Trunk Assessment Upper Extremity Assessment Upper Extremity Assessment: LUE deficits/detail LUE Coordination: decreased gross motor (shoulder, has been hurting for 5 years)           Communication Communication Communication: No difficulties   Cognition Arousal/Alertness: Awake/alert Behavior During Therapy: WFL for tasks assessed/performed Overall Cognitive Status: Within Functional Limits for tasks assessed                                                  Home Living Family/patient expects to be discharged to:: Private residence Living Arrangements: Children Available Help at Discharge: Family;Available PRN/intermittently Type of Home: House Home Access: Level entry     Home Layout: One level     Bathroom Shower/Tub: Occupational psychologist: Standard Bathroom Accessibility: Yes How Accessible:  (rollator) Home Equipment: Wheelchair - manual;BSC/3in1;Shower seat;Grab bars - tub/shower;Hand held Chief Technology Officer (4 wheels)   Additional Comments: only uses wheelchair when going out of house      Prior Functioning/Environment Prior Level of Function : Needs assist       Physical Assist : Mobility (physical) Mobility (physical): Bed  mobility;Transfers ADLs (physical): Bathing;Dressing;Toileting;IADLs Mobility Comments: Uses WC mainly, pushed in rollator to shower since WC too wide. assist for all transfers to and from chair/recliner ADLs Comments: aide for bathing and dressing a few hours 3x/wk; pt hjas potential to do more for herself        OT Problem List: Decreased strength;Decreased range of motion;Impaired balance (sitting and/or standing);Obesity      OT Treatment/Interventions: Self-care/ADL training;DME and/or AE instruction;Patient/family education;Balance training;Therapeutic exercise    OT Goals(Current goals can be found in the care plan section) Acute Rehab OT Goals Patient Stated Goal: to go home and hopeful to go to A'd living at some point OT Goal Formulation: With patient Time For Goal Achievement: 12/08/21 Potential to Achieve Goals: Good  OT Frequency: Min 2X/week    Co-evaluation PT/OT/SLP Co-Evaluation/Treatment: Yes Reason for Co-Treatment: For patient/therapist safety PT goals addressed during session: Mobility/safety with mobility OT goals addressed during session: ADL's and self-care;Strengthening/ROM      AM-PAC OT "6 Clicks" Daily Activity     Outcome Measure Help from another person eating meals?: None Help from another person taking care of personal grooming?: A Little Help from another person toileting, which includes using toliet, bedpan, or urinal?: A Lot Help from another person bathing (including washing, rinsing, drying)?: Total Help from another person to put on and taking off regular  upper body clothing?: A Little (for shirt) Help from another person to put on and taking off regular lower body clothing?: Total 6 Click Score: 14   End of Session Nurse Communication: Mobility status (+41mn A step pivot; pt with wheezing and SOB at end of sessoin (RN called RT))  Activity Tolerance:  (pt with SOB/DOE at end of session) Patient left:    OT Visit Diagnosis: Unsteadiness  on feet (R26.81);Other abnormalities of gait and mobility (R26.89);Muscle weakness (generalized) (M62.81);Pain Pain - Right/Left: Left Pain - part of body: Shoulder                Time: 06147-0929OT Time Calculation (min): 51 min Charges:  OT General Charges $OT Visit: 1 Visit OT Evaluation $OT Eval Moderate Complexity: 1 Mod OT Treatments $Self Care/Home Management : 8-22 mins  CGolden Circle OTR/L Acute Rehab Services [facility] [phone] [phone]   LAlmon Register7/20/2023, 9:42 AM

## 2021-11-24 NOTE — TOC Initial Note (Addendum)
Transition of Care [facility]) - Initial/Assessment Note    Patient Details  Name: [name] MRN: [id number] Date of Birth: 28-Sep-1934  [facility] CM/SW Contact:    Angelita Ingles, RN Phone [phone]  11/24/2021, 10:10 AM  Clinical Narrative:                 TOC following patient with high risk for readmission. CM is familiar with patient that was recently discharged from [facility]. Patient comes from home where she lives with her daughter. Family provides care and daughter states that family has been privately paying someone to come in and give the patient a bath. Family does care for the patient at home. Daughter reports that patient has PCP and follows up with PCP. Patient has no issues with obtaining meds. Patient is ok with resuming home health with current home health agency [facility]. Message has been sent to [facility] to verify that patient is still active with [facility]. CM has received a message from OT stating that patient and family are inquiring about a hospital bed. CM has had previous conversations with [facility] about the hospital bed and family declined because they want a bed just like the hospital bed and insurance will not cover this. CM at bedside this am. CM asked daughter and patient about the hospital bed inquiry and began to recall the last conversation about insurance not covering. Daughter and patient agreed that they do not want the hospital bed. Daughter states no we have tried 2 other times and it does not work for [place]. TOC will continue to follow for any disposition needs.   [facility] orders have been entered and [facility] with Latricia Heft has confirmed that patient is active for home health services and can resume at discharge. No other needs noted at this time.   Expected Discharge Plan: [place] Barriers to Discharge: Continued Medical Work up   Patient Goals and CMS Choice Patient states their goals for  this hospitalization and ongoing recovery are:: Wants to get better   Choice offered to / list presented to : NA  Expected Discharge Plan and Services Expected Discharge Plan: [place] In-house Referral: NA Discharge Planning Services: CM Consult Post Acute Care Choice: NA (will resume care with previous home health agency) Living arrangements for the past 2 months: Single Family Home                 DME Arranged: N/A (refused bed does not want manual bed only wants bed like the hospital which insurance will not approve) DME Agency: NA                  Prior Living Arrangements/Services Living arrangements for the past 2 months: Single Family Home Lives with:: Adult Children Patient language and need for interpreter reviewed:: Yes Do you feel safe going back to the place where you live?: Yes      Need for Family Participation in Patient Care: Yes (Comment) Care giver support system in place?: Yes (comment) Current home services: DME (walker, shower chair, wheelchair) Criminal Activity/Legal Involvement Pertinent to Current Situation/Hospitalization: No - Comment as needed  Activities of Daily Living Home Assistive Devices/Equipment: Wheelchair, Environmental consultant (specify type) ADL Screening (condition at time of admission) Patient's cognitive ability adequate to safely complete daily activities?: Yes Is the patient deaf or have difficulty hearing?: Yes Does the patient have difficulty seeing, even when wearing glasses/contacts?: Yes  Does the patient have difficulty concentrating, remembering, or making decisions?: Yes Patient able to express need for assistance with ADLs?: Yes Does the patient have difficulty dressing or bathing?: Yes Independently performs ADLs?: No Communication: Needs assistance Is this a change from baseline?: Pre-admission baseline Dressing (OT): Needs assistance Is this a change from baseline?: Pre-admission baseline Grooming: Needs  assistance Is this a change from baseline?: Pre-admission baseline Feeding: Independent Bathing: Needs assistance Is this a change from baseline?: Pre-admission baseline Toileting: Needs assistance Is this a change from baseline?: Pre-admission baseline In/Out Bed: Needs assistance Is this a change from baseline?: Pre-admission baseline Walks in Home: Dependent Is this a change from baseline?: Pre-admission baseline Does the patient have difficulty walking or climbing stairs?: Yes Weakness of Legs: Both Weakness of Arms/Hands: Both  Permission Sought/Granted Permission sought to share information with : Family Supports Permission granted to share information with : Yes, Verbal Permission Granted  Share Information with NAME: [name]     Permission granted to share info w Relationship: daughter  Permission granted to share info w Contact Information: [phone]  Emotional Assessment Appearance:: Appears stated age Attitude/Demeanor/Rapport: Unable to Assess (quiet allowed daughter to do most of the talking) Affect (typically observed): Quiet Orientation: : Oriented to Self, Oriented to Place, Oriented to  Time, Oriented to Situation Alcohol / Substance Use: Not Applicable Psych Involvement: No (comment)  Admission diagnosis:  Acute pulmonary edema (HCC) [J81.0] Acute renal insufficiency [N28.9] Acute chest pain [R07.9] Acute on chronic diastolic (congestive) heart failure (HCC) [I50.33] Acute cough [R05.1] CHF exacerbation (HCC) [I50.9] Patient Active Problem List   Diagnosis Date Noted   AKI (acute kidney injury) ([place]) 11/22/2021   Acute on chronic diastolic (congestive) heart failure ([place]) 11/19/2021   Anxiety 11/10/2021   Breast cancer ([place]) 10/22/2021   Right elbow pain 04/27/2021   Chronic pain of right hand 04/27/2021   Iron deficiency anemia 09/13/2020   Bilateral primary osteoarthritis of knee 04/12/2020   Chronic radicular lumbar pain 03/18/2020   Lumbar  spondylosis 03/18/2020   Spinal stenosis, lumbar region, with neurogenic claudication 03/18/2020   Moderate nonproliferative diabetic retinopathy of both eyes without macular edema associated with type 2 diabetes mellitus ([place]) 08/27/2019   Type 2 diabetes mellitus with hyperlipidemia ([place]) 12/13/2017   GERD with esophagitis 11/15/2016   Dementia ([place]) 11/15/2016   Endometrial polyp 06/23/2016   Vitamin D deficiency 08/02/2015   Acute kidney injury superimposed on chronic kidney disease ([place]) 12/18/2014   Gout 10/27/2014   CN (constipation) 05/10/2014   COPD with chronic bronchitis ([place]) 04/21/2014   Estrogen deficiency 02/19/2014   Insomnia 09/17/2012   Long term current use of anticoagulant therapy 02/02/2012   Chronic diastolic congestive heart failure ([place]) 01/29/2012   Class 3 obesity ([place]) 02/15/2009   Microcytic anemia 02/15/2009   TENSION HEADACHE 01/07/2008   Hyperlipidemia 12/03/2006   Essential hypertension 12/03/2006   PCP:  Bonnita Hollow, MD Pharmacy:   [facility] [address], [place] - [place] [address] Phone: [phone] Fax: [phone]  [facility] [place], [place] - [address] [place] [address] Phone: [phone] Fax: [phone]     Social Determinants of Health (SDOH) Interventions    Readmission Risk Interventions    11/24/2021   10:03 AM 10/20/2021    3:50 PM 02/18/2020   11:52 AM  Readmission Risk Prevention Plan  Transportation Screening Complete Complete Complete  Medication Review (RN Care  Manager) Complete Complete Complete  PCP or Specialist appointment within 3-5 days of discharge Complete Complete Complete  HRI or Home Care Consult Complete Complete Complete  SW Recovery Care/Counseling Consult Complete Complete Complete  Palliative Care Screening Not Applicable Not Applicable Not [place] Not  Applicable Not Applicable Not Applicable

## 2021-11-24 NOTE — Plan of Care (Signed)
  Problem: Nutrition: Goal: Adequate nutrition will be maintained Outcome: Completed/Met

## 2021-11-24 NOTE — Progress Notes (Signed)
PROGRESS NOTE    [name]  XFG:[id number] DOB: 01-12-1935 DOA: 11/19/2021 PCP: Bonnita Hollow, MD  86/F chronically ill morbidly obese female with history of chronic diastolic CHF, CAD,  NSTEMI 02/2020 s/p DES to RCA), COPD, CKD stage IIIa, IDDM, HTN, HLD, anemia of chronic disease, prior PE/DVT, left breast cancer, depression/anxiety, chronic pain, memory deficits who presented to the ED for evaluation of shortness of breath.  She is debilitated and wheelchair-bound at baseline, spends most of her time in bed, able to transfer to bedside commode for BMs. -In the ED vitals were stable, lungs with bilateral wheezing and rails and positive lower extremity edema, chest x-ray noted cardiomegaly, pulmonary vascular congestion, creatinine was 2.0 on admission higher than baseline -Improving on diuretics, unfortunately creatinine has trended up to 2.7   Subjective: -Feels better, breathing is improving, granddaughter is at bedside  Assessment and Plan:  Acute on chronic diastolic CHF  -Compliant with diuretics at baseline -Recent TTE 07/05/2021 showed EF 55-60%, G1DD. -She is> 2 L negative, weight appears unreliable -Unfortunately creatinine has bumped to 2.7 yesterday from a baseline of 1.6, diuretics held, creatinine starting to improve -Attempt to wean off O2 as tolerated -PT to assess today  AKI on CKD3b -Suspect hemodynamically mediated -Baseline creatinine around 1.6, creatinine was 2.0 on admission,  trended up to 2.7,  -Holding diuretics, avoid hypotension, will DC Imdur -Monitor urine output, BMP in a.m.  Morbid obesity Debility, wheelchair-bound status -Poor prognostic factors, will request PT eval   Coronary artery disease of native artery of native heart with stable angina pectoris (HCC) -Stable, continue  aspirin, Plavix, atorvastatin, Imdur.   COPD with chronic bronchitis (HCC) -Stable, continue DuoNebs, albuterol   Type 2 diabetes mellitus with diabetic  neuropathy, with long-term current use of insulin (HCC) -CBGs are elevated, increase insulin dose   Essential hypertension Continue home Coreg   Anxiety associated with depression Continue home Cymbalta.   Chronic pain syndrome Continue Percocet q12h as needed.   Dyslipidemia associated with type 2 diabetes mellitus (HCC) Continue atorvastatin.   Mild memory deficits -Continue Namenda she is AAOx3   DVT prophylaxis: Lovenox Code Status: DNR Family Communication: Discussed with patient and granddaughter at bedside yesterday Disposition Plan: Home likely 1 to 2 days pending improvement in kidney function  Consultants:    Procedures:   Antimicrobials:    Objective: Vitals:   11/24/21 0323 11/24/21 0718 11/24/21 0826 11/24/21 0853  BP: (!) 114/51   103/88  Pulse: 70 74 69 74  Resp:  '18 14 17  '$ Temp: 98.6 F (37 C)   98.5 F (36.9 C)  TempSrc: Axillary   Oral  SpO2:  99%  98%  Weight: (!) 139.6 kg     Height:        Intake/Output Summary (Last 24 hours) at 11/24/2021 1147 Last data filed at 11/24/2021 0800 Gross per 24 hour  Intake 914 ml  Output --  Net 914 ml   Filed Weights   11/22/21 0329 11/23/21 0500 11/24/21 0323  Weight: (!) 139.4 kg (!) 140.9 kg (!) 139.6 kg    Examination:  General exam: Morbidly obese chronically ill female laying in bed, AAOx3, no distress HEENT: Neck obese unable to assess JVD CVS: S1-S2, regular rhythm Lungs: Diminished breath sounds to bases otherwise clear Abdomen: Soft, obese, nontender, bowel sounds present Extremities: No edema  Skin: No rashes Psychiatry:  Mood & affect appropriate.     Data Reviewed:   CBC: Recent Labs  Lab 11/19/21  1611 11/20/21 0442 11/21/21 0047 11/22/21 0102 11/24/21 0051  WBC 10.4 13.2* 11.6* 12.4* 13.2*  NEUTROABS 6.7  --   --   --   --   HGB 9.3* 9.4* 8.8* 8.7* 8.5*  HCT 30.4* 29.9* 28.6* 27.7* 27.1*  MCV 95.0 92.9 94.1 93.3 93.1  PLT 225 221 206 192 008   Basic Metabolic  Panel: Recent Labs  Lab 11/20/21 0442 11/21/21 0047 11/22/21 0705 11/23/21 0051 11/24/21 0051  NA 136 137 138 137 137  K 4.5 4.4 4.8 5.1 4.9  CL 104 102 108 103 104  CO2 '23 25 25 25 23  '$ GLUCOSE 241* 176* 219* 223* 243*  BUN 46* 52* 58* 71* 71*  CREATININE 1.91* 2.07* 2.13* 2.70* 2.48*  CALCIUM 8.1* 8.1* 7.7* 8.0* 8.2*  MG 1.2*  --   --  1.3*  --    GFR: Estimated Creatinine Clearance: 24.3 mL/min (A) (by C-G formula based on SCr of 2.48 mg/dL (H)). Liver Function Tests: Recent Labs  Lab 11/19/21 1611 11/21/21 0047 11/22/21 0705  AST 19 12* 11*  ALT '12 10 11  '$ ALKPHOS 82 82 73  BILITOT 0.3 0.1* 0.5  PROT 7.0 6.8 6.4*  ALBUMIN 2.7* 2.5* 2.4*   Recent Labs  Lab 11/19/21 1611  LIPASE 30   No results for input(s): "AMMONIA" in the last 168 hours. Coagulation Profile: No results for input(s): "INR", "PROTIME" in the last 168 hours. Cardiac Enzymes: No results for input(s): "CKTOTAL", "CKMB", "CKMBINDEX", "TROPONINI" in the last 168 hours. BNP (last 3 results) No results for input(s): "PROBNP" in the last 8760 hours. HbA1C: No results for input(s): "HGBA1C" in the last 72 hours. CBG: Recent Labs  Lab 11/23/21 1155 11/23/21 1622 11/23/21 2112 11/24/21 0618 11/24/21 1137  GLUCAP 224* 273* 332* 197* 263*   Lipid Profile: No results for input(s): "CHOL", "HDL", "LDLCALC", "TRIG", "CHOLHDL", "LDLDIRECT" in the last 72 hours. Thyroid Function Tests: No results for input(s): "TSH", "T4TOTAL", "FREET4", "T3FREE", "THYROIDAB" in the last 72 hours. Anemia Panel: No results for input(s): "VITAMINB12", "FOLATE", "FERRITIN", "TIBC", "IRON", "RETICCTPCT" in the last 72 hours. Urine analysis:    Component Value Date/Time   COLORURINE YELLOW 10/13/2021 1908   APPEARANCEUR HAZY (A) 10/13/2021 1908   LABSPEC 1.016 10/13/2021 1908   PHURINE 5.0 10/13/2021 1908   GLUCOSEU NEGATIVE 10/13/2021 1908   HGBUR NEGATIVE 10/13/2021 1908   BILIRUBINUR NEGATIVE 10/13/2021 1908    BILIRUBINUR neg 06/03/2019 1116   KETONESUR NEGATIVE 10/13/2021 1908   PROTEINUR 100 (A) 10/13/2021 1908   UROBILINOGEN 0.2 06/03/2019 1116   UROBILINOGEN 0.2 02/16/2018 1514   NITRITE NEGATIVE 10/13/2021 1908   LEUKOCYTESUR SMALL (A) 10/13/2021 1908   Sepsis Labs: '@LABRCNTIP'$ (procalcitonin:4,lacticidven:4)  ) Recent Results (from the past 240 hour(s))  MRSA Next Gen by PCR, Nasal     Status: None   Collection Time: 11/20/21  3:53 AM   Specimen: Nasal Mucosa; Nasal Swab  Result Value Ref Range Status   MRSA by PCR Next Gen NOT DETECTED NOT DETECTED Final    Comment: (NOTE) The GeneXpert MRSA Assay (FDA approved for NASAL specimens only), is one component of a comprehensive MRSA colonization surveillance program. It is not intended to diagnose MRSA infection nor to guide or monitor treatment for MRSA infections. Test performance is not FDA approved in patients less than 16 years old. Performed at [facility], [place] [address], [place], [place] [phone]      Radiology Studies: No results found.   Scheduled Meds:  anastrozole  1 mg Oral Daily   aspirin EC  81 mg Oral Daily   atorvastatin  80 mg Oral Daily   carvedilol  12.5 mg Oral BID   clopidogrel  75 mg Oral Daily   enoxaparin (LOVENOX) injection  30 mg Subcutaneous Q24H   escitalopram  10 mg Oral Daily   febuxostat  40 mg Oral Daily   feeding supplement (GLUCERNA SHAKE)  237 mL Oral TID BM   insulin aspart  0-15 Units Subcutaneous TID WC   insulin aspart  0-5 Units Subcutaneous QHS   insulin aspart  6 Units Subcutaneous TID WC   insulin glargine-yfgn  30 Units Subcutaneous Daily   memantine  10 mg Oral Daily   memantine  5 mg Oral QHS   miconazole   Topical BID   mometasone-formoterol  2 puff Inhalation BID   pantoprazole  20 mg Oral Daily   polyethylene glycol  17 g Oral BID   senna-docusate  1 tablet Oral BID   sodium chloride flush  3 mL Intravenous Q12H   Continuous Infusions:   LOS: 4 days     Time spent: 69mn  PDomenic Polite MD [facility]   11/24/2021, 11:47 AM

## 2021-11-24 NOTE — Evaluation (Signed)
Physical Therapy Evaluation Patient Details Name: [name] MRN: [id number] DOB: [birth year] Today's Date: 11/24/2021  History of Present Illness  87 y.o. female admitted 7/15 with acute CHF. PMHx: obesity, chronic HFpEF, CAD, NSTEMI, COPD, CKD, IDDM, HTN, HLD, anemia of chronic disease, left breast CA, depression/anxiety, chronic pain, memory deficits  Clinical Impression  Pt pleasant and reports inability to walk for 1 year. Family with progressive functional decline of their own and pt reports aid only a few hours a day needing increased assist. Pt would benefit from [facility] to allow increased mobility without physical assist. Pt with decreased functional mobility and strength who is normally able to pivot with min assist and will benefit from acute therapy to maximize mobility and activity acutely to decrease burden of care.        Recommendations for follow up therapy are one component of a multi-disciplinary discharge planning process, led by the attending physician.  Recommendations may be updated based on patient status, additional functional criteria and insurance authorization.  Follow Up Recommendations Home health PT      Assistance Recommended at Discharge Intermittent Supervision/Assistance  Patient can return home with the following  Assistance with cooking/housework;Assist for transportation;Help with stairs or ramp for entrance;A lot of help with walking and/or transfers;A lot of help with bathing/dressing/bathroom;Direct supervision/assist for medications management;Direct supervision/assist for financial management    Equipment Recommendations Hospital bed  Recommendations for Other Services       Functional Status Assessment Patient has had a recent decline in their functional status and/or demonstrates limited ability to make significant improvements in function in a reasonable and predictable amount of time     Precautions / Restrictions Precautions Precautions:  Fall Precaution Comments: watch SpO2 Restrictions Weight Bearing Restrictions: No      Mobility  Bed Mobility Overal bed mobility: Needs Assistance Bed Mobility: Supine to Sit     Supine to sit: Mod assist, HOB elevated     General bed mobility comments: Pt can get her legs 75% off of the bed, then needs min A to come up with trunk from the back and A to scoot forward with bed pad to EOB with HOB 20 degrees    Transfers Overall transfer level: Needs assistance   Transfers: Sit to/from Stand, Bed to chair/wheelchair/BSC Sit to Stand: Mod assist, +2 safety/equipment, Min assist           General transfer comment: Pt needing Mod A +2 safety/A for sit>stand from EOB (initial standing for the day) and A to maintain standing with maintained flexion. Pivot bed <> BSC with pt grasping equipment with bil hands facing then needing to rotate to sit, pt reports inability/insecurity with 90degrees pivot.  Sit>stand from 3n1 was min A +2 safety, total assist for pericare and washing legs    Ambulation/Gait               General Gait Details: unable  Stairs            Wheelchair Mobility    Modified Rankin (Stroke Patients Only)       Balance Overall balance assessment: Needs assistance Sitting-balance support: No upper extremity supported, Feet supported Sitting balance-Leahy Scale: Fair Sitting balance - Comments: static sitting without assist   Standing balance support: Bilateral upper extremity supported Standing balance-Leahy Scale: Poor Standing balance comment: flexed trunk and bil UE support  Pertinent Vitals/Pain Pain Assessment Pain Score: 4  Pain Location: left shoulder and sensitive skin on legs to touch Pain Descriptors / Indicators: Discomfort, Grimacing Pain Intervention(s): Limited activity within patient's tolerance, Repositioned, Monitored during session    [place] expects to be  discharged to:: Private residence Living Arrangements: Children Available Help at Discharge: Family;Available PRN/intermittently Type of Home: House Home Access: Level entry       Home Layout: One level Home Equipment: Wheelchair - manual;BSC/3in1;Shower seat;Grab bars - tub/shower;Hand held Chief Technology Officer (4 wheels) Additional Comments: only uses wheelchair when going out of house    Prior Function Prior Level of Function : Needs assist       Physical Assist : Mobility (physical) Mobility (physical): Bed mobility;Transfers ADLs (physical): Bathing;Dressing;Toileting;IADLs Mobility Comments: Uses WC mainly, pushed in rollator to shower since WC too wide. assist for all transfers to and from chair/recliner ADLs Comments: aide for bathing and dressing a few hours 3x/wk; pt hjas potential to do more for herself     Hand Dominance   Dominant Hand: Right    Extremity/Trunk Assessment   Upper Extremity Assessment Upper Extremity Assessment: Defer to OT evaluation LUE Coordination: decreased gross motor (shoulder, has been hurting for 5 years)    Lower Extremity Assessment Lower Extremity Assessment: Generalized weakness (limited by body habitus, sensitive skin)    Cervical / Trunk Assessment Cervical / Trunk Assessment: Other exceptions Cervical / Trunk Exceptions: increased body habitus  Communication   Communication: No difficulties  Cognition Arousal/Alertness: Awake/alert Behavior During Therapy: WFL for tasks assessed/performed Overall Cognitive Status: Within Functional Limits for tasks assessed                                 General Comments: Pt with noted memory deficits and safety awareness deficits, likely baseline        General Comments      Exercises     Assessment/Plan    PT Assessment Patient needs continued PT services  PT Problem List Decreased strength;Decreased activity tolerance;Decreased balance;Decreased  mobility;Cardiopulmonary status limiting activity;Obesity;Decreased range of motion       PT Treatment Interventions DME instruction;Gait training;Functional mobility training;Therapeutic activities;Therapeutic exercise;Balance training;Neuromuscular re-education;Patient/family education    PT Goals (Current goals can be found in the Care Plan section)  Acute Rehab PT Goals Patient Stated Goal: to have more help PT Goal Formulation: With patient/family Time For Goal Achievement: 12/08/21 Potential to Achieve Goals: Fair    Frequency Min 2X/week     Co-evaluation PT/OT/SLP Co-Evaluation/Treatment: Yes Reason for Co-Treatment: For patient/therapist safety PT goals addressed during session: Mobility/safety with mobility OT goals addressed during session: ADL's and self-care;Strengthening/ROM       AM-PAC PT "6 Clicks" Mobility  Outcome Measure Help needed turning from your back to your side while in a flat bed without using bedrails?: A Little Help needed moving from lying on your back to sitting on the side of a flat bed without using bedrails?: A Lot Help needed moving to and from a bed to a chair (including a wheelchair)?: A Lot Help needed standing up from a chair using your arms (e.g., wheelchair or bedside chair)?: A Lot Help needed to walk in hospital room?: Total Help needed climbing 3-5 steps with a railing? : Total 6 Click Score: 11    End of Session   Activity Tolerance: Patient limited by fatigue Patient left: in chair;with call bell/phone within reach;with chair alarm set  Nurse Communication: Mobility status PT Visit Diagnosis: Unsteadiness on feet (R26.81);Other abnormalities of gait and mobility (R26.89);Muscle weakness (generalized) (M62.81);Difficulty in walking, not elsewhere classified (R26.2)    Time: 0379-5583 PT Time Calculation (min) (ACUTE ONLY): 43 min   Charges:   PT Evaluation $PT Eval Moderate Complexity: 1 Mod          [place], PT Acute  Rehabilitation Services Office: [phone]   Lamarr Lulas 11/24/2021, 9:49 AM

## 2021-11-24 NOTE — Progress Notes (Signed)
Mobility Specialist Progress Note    11/24/21 1405  Mobility  Activity Transferred to/from [facility]  Level of Assistance +2 (takes two people)  Information systems manager Ambulated (ft) 2 ft  Activity Response Tolerated fair  $Mobility charge 1 Mobility   Pt received after successful BM. Encouraged pursed lip breathing after. RN present.   Hildred Alamin Mobility Specialist

## 2021-11-25 DIAGNOSIS — I5033 Acute on chronic diastolic (congestive) heart failure: Secondary | ICD-10-CM | POA: Diagnosis not present

## 2021-11-25 LAB — BASIC METABOLIC PANEL
Anion gap: 8 (ref 5–15)
Anion gap: 13 (ref 5–15)
BUN: 76 mg/dL — ABNORMAL HIGH (ref 8–23)
BUN: 78 mg/dL — ABNORMAL HIGH (ref 8–23)
CO2: 25 mmol/L (ref 22–32)
CO2: 20 mmol/L — ABNORMAL LOW (ref 22–32)
Calcium: 8.6 mg/dL — ABNORMAL LOW (ref 8.9–10.3)
Calcium: 8.7 mg/dL — ABNORMAL LOW (ref 8.9–10.3)
Chloride: 104 mmol/L (ref 98–111)
Chloride: 103 mmol/L (ref 98–111)
Creatinine, Ser: 2.15 mg/dL — ABNORMAL HIGH (ref 0.44–1.00)
Creatinine, Ser: 2.19 mg/dL — ABNORMAL HIGH (ref 0.44–1.00)
GFR, Estimated: 22 mL/min — ABNORMAL LOW (ref >60)
GFR, Estimated: 22 mL/min — ABNORMAL LOW (ref >60)
Glucose, Bld: 187 mg/dL — ABNORMAL HIGH (ref 70–99)
Glucose, Bld: 284 mg/dL — ABNORMAL HIGH (ref 70–99)
Potassium: 5.6 mmol/L — ABNORMAL HIGH (ref 3.5–5.1)
Potassium: 5.8 mmol/L — ABNORMAL HIGH (ref 3.5–5.1)
Sodium: 137 mmol/L (ref 135–145)
Sodium: 136 mmol/L (ref 135–145)

## 2021-11-25 LAB — GLUCOSE, CAPILLARY
Glucose-Capillary: 192 mg/dL — ABNORMAL HIGH (ref 70–99)
Glucose-Capillary: 295 mg/dL — ABNORMAL HIGH (ref 70–99)
Glucose-Capillary: 256 mg/dL — ABNORMAL HIGH (ref 70–99)
Glucose-Capillary: 286 mg/dL — ABNORMAL HIGH (ref 70–99)

## 2021-11-25 MED ORDER — INSULIN GLARGINE-YFGN 100 UNIT/ML ~~LOC~~ SOLN
40.0000 [IU] | Freq: Every day | SUBCUTANEOUS | Status: DC
  Administered 2021-11-25 – 2021-11-26 (×2): 40 [IU] via SUBCUTANEOUS
  Filled 2021-11-25 (×2): qty 0.4

## 2021-11-25 MED ORDER — SODIUM POLYSTYRENE SULFONATE 15 GM/60ML PO SUSP
15.0000 g | Freq: Once | ORAL | Status: AC
  Administered 2021-11-25: 15 g via ORAL
  Filled 2021-11-25: qty 60

## 2021-11-25 MED ORDER — TORSEMIDE 20 MG PO TABS
20.0000 mg | ORAL_TABLET | Freq: Every day | ORAL | Status: DC
  Administered 2021-11-25 – 2021-11-26 (×2): 20 mg via ORAL
  Filled 2021-11-25 (×2): qty 1

## 2021-11-25 MED ORDER — WITCH HAZEL-GLYCERIN EX PADS
MEDICATED_PAD | CUTANEOUS | Status: DC | PRN
  Filled 2021-11-25: qty 100

## 2021-11-25 NOTE — Progress Notes (Signed)
PROGRESS NOTE    [name]  CBJ:[id number] DOB: 1934-09-01 DOA: 11/19/2021 PCP: Bonnita Hollow, MD  86/F chronically ill morbidly obese female with history of chronic diastolic CHF, CAD,  NSTEMI 02/2020 s/p DES to RCA), COPD, CKD stage IIIa, IDDM, HTN, HLD, anemia of chronic disease, prior PE/DVT, left breast cancer, depression/anxiety, chronic pain, memory deficits who presented to the ED for evaluation of shortness of breath.  She is debilitated and wheelchair-bound at baseline, spends most of her time in bed, able to transfer to bedside commode for BMs. -In the ED vitals were stable, lungs with bilateral wheezing and rails and positive lower extremity edema, chest x-ray noted cardiomegaly, pulmonary vascular congestion, creatinine was 2.0 on admission higher than baseline -Improving on diuretics, unfortunately creatinine has trended up to 2.7, diuretics held, starting to improve   Subjective: -Feels well overall, wants to go home  Assessment and Plan:  Acute on chronic diastolic CHF  -Compliant with diuretics at baseline -Recent TTE 07/05/2021 showed EF 55-60%, G1DD. -She is> 2 L negative, weight appears unreliable -Unfortunately creatinine has bumped to 2.7 from a baseline of 1.6, diuretics held, creatinine continuing to improve, start torsemide 20 Mg daily -Attempt to wean off O2 as tolerated -Seen by PT, progressive functional decline, home health recommended  Hyperkalemia -Secondary to CKD, Kayexalate X1, repeat BMP  AKI on CKD3b -Suspect hemodynamically mediated -Baseline creatinine around 1.6, creatinine was 2.0 on admission,  trended up to 2.7,  -Holding diuretics, avoid hypotension, discontinued Imdur, creatinine improving down to 2.1 -Monitor urine output, BMP in a.m.  Morbid obesity Debility, wheelchair-bound status -Poor prognostic factors,    Coronary artery disease of native artery of native heart with stable angina pectoris (HCC) -Stable, continue   aspirin, Plavix, atorvastatin, Imdur.   COPD with chronic bronchitis (HCC) -Stable, continue DuoNebs, albuterol   Type 2 diabetes mellitus with diabetic neuropathy, with long-term current use of insulin (HCC) -CBGs are elevated, increased insulin dose   Essential hypertension Continue home Coreg   Anxiety associated with depression Continue home Cymbalta.   Chronic pain syndrome Continue Percocet q12h as needed.   Dyslipidemia  Continue atorvastatin.   Mild memory deficits -Continue Namenda she is AAOx3   DVT prophylaxis: Lovenox Code Status: DNR Family Communication: Discussed with daughters at bedside disposition Plan: Home likely 1 to 2 days pending improvement in kidney function  Consultants:    Procedures:   Antimicrobials:    Objective: Vitals:   11/24/21 1643 11/24/21 2004 11/25/21 0400 11/25/21 0747  BP: (!) 150/61 (!) 116/40 (!) 122/39   Pulse: 68 75 79   Resp: '17 16 18   '$ Temp: 98.4 F (36.9 C) 98.7 F (37.1 C) 98.2 F (36.8 C)   TempSrc: Oral Oral Oral   SpO2: 99% 100% 100% 100%  Weight:   (!) 141.3 kg   Height:        Intake/Output Summary (Last 24 hours) at 11/25/2021 1232 Last data filed at 11/25/2021 1100 Gross per 24 hour  Intake 600 ml  Output 0 ml  Net 600 ml   Filed Weights   11/24/21 0323 11/24/21 1553 11/25/21 0400  Weight: (!) 139.6 kg (!) 141.1 kg (!) 141.3 kg    Examination:  General exam: Morbidly obese chronically ill female laying in bed, AAOx3, no distress HEENT: Neck obese unable to assess JVD CVS: S1-S2, regular rhythm Lungs: Diminished breath sounds at the bases otherwise clear Abdomen: Soft, obese, nontender, bowel sounds present Extremities: No edema  Skin: No rashes  Psychiatry:  Mood & affect appropriate.     Data Reviewed:   CBC: Recent Labs  Lab 11/19/21 1611 11/20/21 0442 11/21/21 0047 11/22/21 0102 11/24/21 0051  WBC 10.4 13.2* 11.6* 12.4* 13.2*  NEUTROABS 6.7  --   --   --   --   HGB 9.3* 9.4*  8.8* 8.7* 8.5*  HCT 30.4* 29.9* 28.6* 27.7* 27.1*  MCV 95.0 92.9 94.1 93.3 93.1  PLT 225 221 206 192 299   Basic Metabolic Panel: Recent Labs  Lab 11/20/21 0442 11/21/21 0047 11/22/21 0705 11/23/21 0051 11/24/21 0051 11/25/21 0314  NA 136 137 138 137 137 137  K 4.5 4.4 4.8 5.1 4.9 5.6*  CL 104 102 108 103 104 104  CO2 '23 25 25 25 23 25  '$ GLUCOSE 241* 176* 219* 223* 243* 187*  BUN 46* 52* 58* 71* 71* 76*  CREATININE 1.91* 2.07* 2.13* 2.70* 2.48* 2.15*  CALCIUM 8.1* 8.1* 7.7* 8.0* 8.2* 8.6*  MG 1.2*  --   --  1.3*  --   --    GFR: Estimated Creatinine Clearance: 28.2 mL/min (A) (by C-G formula based on SCr of 2.15 mg/dL (H)). Liver Function Tests: Recent Labs  Lab 11/19/21 1611 11/21/21 0047 11/22/21 0705  AST 19 12* 11*  ALT '12 10 11  '$ ALKPHOS 82 82 73  BILITOT 0.3 0.1* 0.5  PROT 7.0 6.8 6.4*  ALBUMIN 2.7* 2.5* 2.4*   Recent Labs  Lab 11/19/21 1611  LIPASE 30   No results for input(s): "AMMONIA" in the last 168 hours. Coagulation Profile: No results for input(s): "INR", "PROTIME" in the last 168 hours. Cardiac Enzymes: No results for input(s): "CKTOTAL", "CKMB", "CKMBINDEX", "TROPONINI" in the last 168 hours. BNP (last 3 results) No results for input(s): "PROBNP" in the last 8760 hours. HbA1C: No results for input(s): "HGBA1C" in the last 72 hours. CBG: Recent Labs  Lab 11/24/21 1137 11/24/21 1641 11/24/21 2109 11/25/21 0606 11/25/21 1131  GLUCAP 263* 317* 276* 192* 295*   Lipid Profile: No results for input(s): "CHOL", "HDL", "LDLCALC", "TRIG", "CHOLHDL", "LDLDIRECT" in the last 72 hours. Thyroid Function Tests: No results for input(s): "TSH", "T4TOTAL", "FREET4", "T3FREE", "THYROIDAB" in the last 72 hours. Anemia Panel: No results for input(s): "VITAMINB12", "FOLATE", "FERRITIN", "TIBC", "IRON", "RETICCTPCT" in the last 72 hours. Urine analysis:    Component Value Date/Time   COLORURINE YELLOW 10/13/2021 1908   APPEARANCEUR HAZY (A) 10/13/2021  1908   LABSPEC 1.016 10/13/2021 1908   PHURINE 5.0 10/13/2021 1908   GLUCOSEU NEGATIVE 10/13/2021 1908   HGBUR NEGATIVE 10/13/2021 1908   BILIRUBINUR NEGATIVE 10/13/2021 1908   BILIRUBINUR neg 06/03/2019 1116   KETONESUR NEGATIVE 10/13/2021 1908   PROTEINUR 100 (A) 10/13/2021 1908   UROBILINOGEN 0.2 06/03/2019 1116   UROBILINOGEN 0.2 02/16/2018 1514   NITRITE NEGATIVE 10/13/2021 1908   LEUKOCYTESUR SMALL (A) 10/13/2021 1908   Sepsis Labs: '@LABRCNTIP'$ (procalcitonin:4,lacticidven:4)  ) Recent Results (from the past 240 hour(s))  MRSA Next Gen by PCR, Nasal     Status: None   Collection Time: 11/20/21  3:53 AM   Specimen: Nasal Mucosa; Nasal Swab  Result Value Ref Range Status   MRSA by PCR Next Gen NOT DETECTED NOT DETECTED Final    Comment: (NOTE) The GeneXpert MRSA Assay (FDA approved for NASAL specimens only), is one component of a comprehensive MRSA colonization surveillance program. It is not intended to diagnose MRSA infection nor to guide or monitor treatment for MRSA infections. Test performance is not FDA approved in patients  less than 40 years old. Performed at [facility], [place] [address], [place], [place] [address]      Radiology Studies: No results found.   Scheduled Meds:  anastrozole  1 mg Oral Daily   aspirin EC  81 mg Oral Daily   atorvastatin  80 mg Oral Daily   carvedilol  12.5 mg Oral BID   clopidogrel  75 mg Oral Daily   enoxaparin (LOVENOX) injection  30 mg Subcutaneous Q24H   escitalopram  10 mg Oral Daily   febuxostat  40 mg Oral Daily   feeding supplement (GLUCERNA SHAKE)  237 mL Oral TID BM   insulin aspart  0-15 Units Subcutaneous TID WC   insulin aspart  0-5 Units Subcutaneous QHS   insulin aspart  6 Units Subcutaneous TID WC   insulin glargine-yfgn  40 Units Subcutaneous Daily   memantine  10 mg Oral Daily   memantine  5 mg Oral QHS   miconazole   Topical BID   mometasone-formoterol  2 puff Inhalation BID   pantoprazole  20 mg  Oral Daily   polyethylene glycol  17 g Oral BID   senna-docusate  1 tablet Oral BID   sodium chloride flush  3 mL Intravenous Q12H   torsemide  20 mg Oral Daily   Continuous Infusions:   LOS: 5 days    Time spent: 62mn  PDomenic Polite MD [facility]   11/25/2021, 12:32 PM

## 2021-11-26 DIAGNOSIS — I5033 Acute on chronic diastolic (congestive) heart failure: Secondary | ICD-10-CM | POA: Diagnosis not present

## 2021-11-26 LAB — CBC
HCT: 28.1 % — ABNORMAL LOW (ref 36.0–46.0)
Hemoglobin: 8.8 g/dL — ABNORMAL LOW (ref 12.0–15.0)
MCH: 29.3 pg (ref 26.0–34.0)
MCHC: 31.3 g/dL (ref 30.0–36.0)
MCV: 93.7 fL (ref 80.0–100.0)
Platelets: 209 10*3/uL (ref 150–400)
RBC: 3 MIL/uL — ABNORMAL LOW (ref 3.87–5.11)
RDW: 13.7 % (ref 11.5–15.5)
WBC: 11.4 10*3/uL — ABNORMAL HIGH (ref 4.0–10.5)
nRBC: 0 % (ref 0.0–0.2)

## 2021-11-26 LAB — BASIC METABOLIC PANEL
Anion gap: 7 (ref 5–15)
BUN: 76 mg/dL — ABNORMAL HIGH (ref 8–23)
CO2: 26 mmol/L (ref 22–32)
Calcium: 8.4 mg/dL — ABNORMAL LOW (ref 8.9–10.3)
Chloride: 101 mmol/L (ref 98–111)
Creatinine, Ser: 2.12 mg/dL — ABNORMAL HIGH (ref 0.44–1.00)
GFR, Estimated: 22 mL/min — ABNORMAL LOW (ref >60)
Glucose, Bld: 183 mg/dL — ABNORMAL HIGH (ref 70–99)
Potassium: 4.7 mmol/L (ref 3.5–5.1)
Sodium: 134 mmol/L — ABNORMAL LOW (ref 135–145)

## 2021-11-26 LAB — GLUCOSE, CAPILLARY
Glucose-Capillary: 165 mg/dL — ABNORMAL HIGH (ref 70–99)
Glucose-Capillary: 297 mg/dL — ABNORMAL HIGH (ref 70–99)

## 2021-11-26 MED ORDER — TORSEMIDE 20 MG PO TABS: 20.0000 mg | ORAL_TABLET | Freq: Every day | ORAL | 1 refills | Status: DC

## 2021-11-26 NOTE — Discharge Summary (Signed)
Physician Discharge Summary  [name] UUE:[id number] DOB: 24-Nov-1934 DOA: 11/19/2021  PCP: Bonnita Hollow, MD  Admit date: 11/19/2021 Discharge date: 11/26/2021  Time spent: 35 minutes  Recommendations for Outpatient Follow-up:  PCP Dr. Grandville Silos in 1 week, please check BMP at follow-up Cardiology Dr. Johney Frame in 2 weeks Palliative care follow-up   Discharge Diagnoses:  Principal Problem:   Acute on chronic diastolic (congestive) heart failure ([place])   CKD3b   AKI (acute kidney injury) ([place])   Essential hypertension   COPD with chronic bronchitis ([place])   Type 2 diabetes mellitus with hyperlipidemia (HCC)   Microcytic anemia   Anxiety   Breast cancer ([place])   Hyperlipidemia   Class 3 obesity (HCC)   Insomnia   Gout   GERD with esophagitis   Dementia ([place]) DNR-DO NOT RESUSCITATE   Discharge Condition: Stable  Diet recommendation: Low-sodium, diabetic  Filed Weights   11/24/21 1553 11/25/21 0400 11/26/21 0524  Weight: (!) 141.1 kg (!) 141.3 kg (!) 138.3 kg    History of present illness:  87/F chronically ill morbidly obese female with history of chronic diastolic CHF, CAD,  NSTEMI 02/2020 s/p DES to RCA), COPD, CKD stage IIIa, IDDM, HTN, HLD, anemia of chronic disease, prior PE/DVT, left breast cancer, depression/anxiety, chronic pain, memory deficits who presented to the ED for evaluation of shortness of breath.  She is debilitated and wheelchair-bound at baseline, spends most of her time in bed, able to transfer to bedside commode for BMs. -In the ED vitals were stable, lungs with bilateral wheezing and rails and positive lower extremity edema, chest x-ray noted cardiomegaly, pulmonary vascular congestion, creatinine was 2.0 on admission higher than baseline  Hospital Course:   Acute on chronic diastolic CHF  -Compliant with diuretics at baseline, complicated by HXT0V -Recent TTE 07/05/2021 showed EF 55-60%, G1DD. -Diuresed with IV Lasix, she is > 4 L negative,  weight appears unreliable/up and down -Unfortunately creatinine has bumped to 2.7 from a baseline of 1.6, diuretics held, creatinine continuing to improve, now improving down to 2.1, started on torsemide 20 Mg daily, instead of home regimen of Lasix 40 Mg daily on account of hypoalbuminemia, GDMT limited by CKD -Educated regarding diet, daily weights and advised to take an extra dose of torsemide for weight gain of 3 LB in 1 day, 5 LB in 1 week   Hyperkalemia -Secondary to CKD, resolved, given a dose of Kayexalate, history of recurrent hyperkalemia, takes Kayexalate weekly at home   AKI on CKD3b -Suspect hemodynamically mediated -Baseline creatinine around 1.6, creatinine was 2.0 on admission,  trended up to 2.7,  -Diuretics held, discontinued Imdur, creatinine improving down to 2.1 -FU with PCP, check labs in 1 week   Morbid obesity Debility, wheelchair-bound status -Poor prognostic factors,    Coronary artery disease of native artery of native heart with stable angina pectoris (HCC) -Stable, continue  aspirin, Plavix, atorvastatin   COPD with chronic bronchitis (HCC) -Stable, continue DuoNebs, albuterol   Type 2 diabetes mellitus with diabetic neuropathy, with long-term current use of insulin (HCC) -Continue home regimen of insulin   Essential hypertension Continue home Coreg   Anxiety associated with depression Continue home Cymbalta.   Chronic pain syndrome Continue Percocet q12h as needed.   Dyslipidemia  Continue atorvastatin.   Mild memory deficits -Continue Namenda she is AAOx3   Code Status: DNR  Discharge Exam: Vitals:   11/26/21 0522 11/26/21 0930  BP: (!) 129/45   Pulse:    Resp: 17  Temp: 98.6 F (37 C)   SpO2: 100% 99%   General exam: Morbidly obese chronically ill female laying in bed, AAOx3, no distress HEENT: Neck obese unable to assess JVD CVS: S1-S2, regular rhythm Lungs: Diminished breath sounds at the bases otherwise clear Abdomen: Soft,  obese, nontender, bowel sounds present Extremities: No edema  Skin: No rashes Psychiatry:  Mood & affect appropriate.   Discharge Instructions   Discharge Instructions     Ambulatory referral to Cardiology   Complete by: As directed    If you have not heard from the Cardiology office within the next 72 hours please call [phone].   Diet - low sodium heart healthy   Complete by: As directed    Diet Carb Modified   Complete by: As directed    Increase activity slowly   Complete by: As directed       Allergies as of 11/26/2021       Reactions   Sulfonamide Derivatives Swelling, Other (See Comments)   Mouth swelling- no resp issues noted   Lokelma [sodium Zirconium Cyclosilicate] Other (See Comments)   Headache, stomach upset   Aricept [donepezil Hcl] Diarrhea, Nausea And Vomiting   GI upset/loose stools   Tramadol Nausea And Vomiting        Medication List     STOP taking these medications    feeding supplement (GLUCERNA SHAKE) Liqd   furosemide 20 MG tablet Commonly known as: LASIX   furosemide 40 MG tablet Commonly known as: LASIX       TAKE these medications    albuterol 108 (90 Base) MCG/ACT inhaler Commonly known as: ProAir HFA Inhale 1-2 puffs into the lungs every 6 (six) hours as needed for wheezing or shortness of breath.   anastrozole 1 MG tablet Commonly known as: ARIMIDEX TAKE 1 TABLET BY MOUTH EVERY DAY   Aspirin Low Dose 81 MG tablet Generic drug: aspirin EC TAKE 1 TABLET BY MOUTH EVERY DAY What changed: how much to take   atorvastatin 80 MG tablet Commonly known as: LIPITOR TAKE 1 TABLET BY MOUTH EVERY DAY   budesonide-formoterol 160-4.5 MCG/ACT inhaler Commonly known as: SYMBICORT Inhale 2 puffs into the lungs 2 (two) times daily. What changed:  when to take this reasons to take this   carvedilol 12.5 MG tablet Commonly known as: COREG TAKE 1 TABLET BY MOUTH 2 TIMES DAILY What changed: when to take this   cetirizine 10  MG tablet Commonly known as: ZYRTEC Take 10 mg by mouth daily as needed for allergies or rhinitis.   clopidogrel 75 MG tablet Commonly known as: PLAVIX Take 1 tablet (75 mg total) by mouth daily.   diclofenac Sodium 1 % Gel Commonly known as: Voltaren Apply 2 g topically 4 (four) times daily. What changed:  how much to take when to take this reasons to take this   escitalopram 10 MG tablet Commonly known as: Lexapro Take 1 tablet (10 mg total) by mouth daily. Take 1/2 tablet in 2 weeks, then go to whole tablet What changed:  how much to take additional instructions   febuxostat 40 MG tablet Commonly known as: ULORIC TAKE 1 TABLET BY MOUTH EVERY DAY   fluticasone 50 MCG/ACT nasal spray Commonly known as: FLONASE Place 1 spray into both nostrils in the morning and at bedtime. What changed:  when to take this reasons to take this   folic acid 1 MG tablet Commonly known as: FOLVITE TAKE 1 TABLET BY MOUTH EVERY DAY   FreeStyle  Libre 14 Day Sensor Misc PLACE 1 DEVICE ON THE SKIN AS DIRECTED EVERY 14 DAYS   guaifenesin 100 MG/5ML syrup Commonly known as: ROBITUSSIN Take 200 mg by mouth 3 (three) times daily as needed for cough.   guaiFENesin 600 MG 12 hr tablet Commonly known as: Mucinex Take 1 tablet (600 mg total) by mouth 2 (two) times daily as needed.   hydrOXYzine 10 MG tablet Commonly known as: ATARAX Take 1 tablet (10 mg total) by mouth every 8 (eight) hours as needed for anxiety. What changed: reasons to take this   ipratropium-albuterol 0.5-2.5 (3) MG/3ML Soln Commonly known as: DUONEB Take 3 mLs by nebulization every 6 (six) hours as needed.   isosorbide mononitrate 60 MG 24 hr tablet Commonly known as: IMDUR TAKE 1 TABLET BY MOUTH EVERY DAY   linaclotide 145 MCG Caps capsule Commonly known as: LINZESS Take 1 capsule (145 mcg total) by mouth daily before breakfast. What changed:  when to take this reasons to take this   meclizine 25 MG  tablet Commonly known as: ANTIVERT TAKE 1 TABLET BY MOUTH THREE TIMES DAILY AS NEEDED FOR DIZZINESS What changed: See the new instructions.   memantine 5 MG tablet Commonly known as: NAMENDA TAKE 2 TABLETS BY MOUTH EVERY DAY and TAKE 1 TABLET BY MOUTH EVERY EVENING What changed:  how much to take how to take this when to take this additional instructions   Nebulizer System All-In-One Misc 1 kit by Does not apply route as needed.   Nebulizer/Tubing/Mouthpiece Kit 1 Units by Does not apply route as needed (for breathing treatments).   nitroGLYCERIN 0.4 MG SL tablet Commonly known as: Nitrostat Take one tablet under the tongue every 5 minutes as needed for chest pain What changed:  how much to take how to take this when to take this reasons to take this additional instructions   NovoLOG FlexPen 100 UNIT/ML FlexPen Generic drug: insulin aspart Inject 14 Units into the skin 3 (three) times daily before meals.   nystatin cream Commonly known as: MYCOSTATIN Apply 1 application topically 2 (two) times daily. What changed:  when to take this reasons to take this   OVER THE COUNTER MEDICATION Take 1 capsule by mouth every other day. Over the counter stool softener - unknown type   oxyCODONE-acetaminophen 7.5-325 MG tablet Commonly known as: Percocet Take 1 tablet by mouth every 12 (twelve) hours as needed for moderate pain or severe pain. Must last 30 days.   pantoprazole 20 MG tablet Commonly known as: PROTONIX Take 1 tablet (20 mg total) by mouth daily.   polyethylene glycol powder 17 GM/SCOOP powder Commonly known as: MiraLax 17 gm mix with liquid twice daily as needed   sodium polystyrene 15 GM/60ML suspension Commonly known as: SPS take 37ms by MOUTH ONCE WEEKLY. TO keep potassium down What changed:  how much to take how to take this when to take this additional instructions   torsemide 20 MG tablet Commonly known as: DEMADEX Take 1 tablet (20 mg total)  by mouth daily. Take an extra dose for weight gain, 3lbs in 1 day or 5lbs in 1 week   Toujeo Max SoloStar 300 UNIT/ML Solostar Pen Generic drug: insulin glargine (2 Unit Dial) Inject 42 Units into the skin daily. Patient assistance program provides What changed: when to take this   Vitamin D (Ergocalciferol) 1.25 MG (50000 UNIT) Caps capsule Commonly known as: DRISDOL TAKE 1 CAPSULE BY MOUTH every 7 days What changed: when to take this  Allergies  Allergen Reactions   Sulfonamide Derivatives Swelling and Other (See Comments)    Mouth swelling- no resp issues noted   Lokelma [Sodium Zirconium Cyclosilicate] Other (See Comments)    Headache, stomach upset   Aricept [Donepezil Hcl] Diarrhea and Nausea And Vomiting    GI upset/loose stools   Tramadol Nausea And Vomiting    Follow-up Information     Bonnita Hollow, MD. Call .   Specialty: Family Medicine Contact information: [place] [address] [place]. Follow up.   Why: You will resume home health services with [facility]. The office will call you with resumption information. If you have any questions or concerns please call the number listed above. Contact information: [place] [phone] [phone]         Freada Bergeron, MD. Schedule an appointment as soon as possible for a visit in 2 week(s).   Specialties: Cardiology, Radiology Contact information: [address] [place] [address] [phone]                  The results of significant diagnostics from this hospitalization (including imaging, microbiology, ancillary and laboratory) are listed below for reference.    Significant Diagnostic Studies: DG Chest 2 View  Result Date: 11/19/2021 CLINICAL DATA:  Chest pain EXAM: CHEST - 2 VIEW COMPARISON:  Previous studies including the examination of 10/16/2021  FINDINGS: Transverse diameter of heart is slightly increased. Central pulmonary vessels are prominent the there is increased density in the lateral aspect of left upper lung field. Rest of the lung fields are unremarkable. There is no significant pleural effusion or pneumothorax. IMPRESSION: Increased density in left upper lung field may be an artifact due to chest wall attenuation or suggest asymmetric pulmonary edema or pneumonia. Electronically Signed   By: Elmer Picker M.D.   On: 11/19/2021 18:02    Microbiology: Recent Results (from the past 240 hour(s))  MRSA Next Gen by PCR, Nasal     Status: None   Collection Time: 11/20/21  3:53 AM   Specimen: Nasal Mucosa; Nasal Swab  Result Value Ref Range Status   MRSA by PCR Next Gen NOT DETECTED NOT DETECTED Final    Comment: (NOTE) The GeneXpert MRSA Assay (FDA approved for NASAL specimens only), is one component of a comprehensive MRSA colonization surveillance program. It is not intended to diagnose MRSA infection nor to guide or monitor treatment for MRSA infections. Test performance is not FDA approved in patients less than 14 years old. Performed at [facility], [place] [address], [place], [place] [address]      Labs: Basic Metabolic Panel: Recent Labs  Lab 11/20/21 0442 11/21/21 0047 11/23/21 0051 11/24/21 0051 11/25/21 0314 11/25/21 1147 11/26/21 0259  NA 136   < > 137 137 137 136 134*  K 4.5   < > 5.1 4.9 5.6* 5.8* 4.7  CL 104   < > 103 104 104 103 101  CO2 23   < > '25 23 25 ' 20* 26  GLUCOSE 241*   < > 223* 243* 187* 284* 183*  BUN 46*   < > 71* 71* 76* 78* 76*  CREATININE 1.91*   < > 2.70* 2.48* 2.15* 2.19* 2.12*  CALCIUM 8.1*   < > 8.0* 8.2* 8.6* 8.7* 8.4*  MG 1.2*  --  1.3*  --   --   --   --    < > =  values in this interval not displayed.   Liver Function Tests: Recent Labs  Lab 11/19/21 1611 11/21/21 0047 11/22/21 0705  AST 19 12* 11*  ALT '12 10 11  ' ALKPHOS 82 82 73  BILITOT 0.3 0.1* 0.5  PROT  7.0 6.8 6.4*  ALBUMIN 2.7* 2.5* 2.4*   Recent Labs  Lab 11/19/21 1611  LIPASE 30   No results for input(s): "AMMONIA" in the last 168 hours. CBC: Recent Labs  Lab 11/19/21 1611 11/20/21 0442 11/21/21 0047 11/22/21 0102 11/24/21 0051 11/26/21 0259  WBC 10.4 13.2* 11.6* 12.4* 13.2* 11.4*  NEUTROABS 6.7  --   --   --   --   --   HGB 9.3* 9.4* 8.8* 8.7* 8.5* 8.8*  HCT 30.4* 29.9* 28.6* 27.7* 27.1* 28.1*  MCV 95.0 92.9 94.1 93.3 93.1 93.7  PLT 225 221 206 192 177 209   Cardiac Enzymes: No results for input(s): "CKTOTAL", "CKMB", "CKMBINDEX", "TROPONINI" in the last 168 hours. BNP: BNP (last 3 results) Recent Labs    07/04/21 1259 10/13/21 1107 11/19/21 1948  BNP 70.1 116.4* 43.5    ProBNP (last 3 results) No results for input(s): "PROBNP" in the last 8760 hours.  CBG: Recent Labs  Lab 11/25/21 1131 11/25/21 1654 11/25/21 2112 11/26/21 0600 11/26/21 1221  GLUCAP 295* 256* 286* 165* 297*       Signed:  Domenic Polite MD.  [facility] 11/26/2021, 1:08 PM

## 2021-11-26 NOTE — TOC Transition Note (Signed)
Transition of Care [facility]) - CM/SW Discharge Note   Patient Details  Name: [name] MRN: [id number] Date of Birth: May 20, 1934  [facility] CM/SW Contact:  Carles Collet, RN Phone Number: 11/26/2021, 12:42 PM   Clinical Narrative:   Notified [facility] that patient will DC today.  No other needs for DC identified at this time.     Final next level of care: Home w Home Health Services Barriers to Discharge: No Barriers Identified   Patient Goals and CMS Choice Patient states their goals for this hospitalization and ongoing recovery are:: Wants to get better   Choice offered to / list presented to : NA  Discharge Placement                       Discharge Plan and Services In-house Referral: NA Discharge Planning Services: CM Consult Post Acute Care Choice: NA (will resume care with previous home health agency)          DME Arranged: N/A (refused bed does not want manual bed only wants bed like the hospital which insurance will not approve) DME Agency: NA         [place]: [place] Date [place]: 11/26/21 Time [place]: [place] Representative spoke with at [place]: [place] ([place]) Interventions     Readmission Risk Interventions    11/24/2021   10:03 AM 10/20/2021    3:50 PM 02/18/2020   11:52 AM  Readmission Risk Prevention Plan  Transportation Screening Complete Complete Complete  Medication Review Press photographer) Complete Complete Complete  PCP or Specialist appointment within 3-5 days of discharge Complete Complete Complete  HRI or Home Care Consult Complete Complete Complete  SW Recovery Care/Counseling Consult Complete Complete Complete  Palliative Care Screening Not Applicable Not Applicable Not [place] Not Applicable Not Applicable Not Applicable

## 2021-11-26 NOTE — Plan of Care (Signed)
  Problem: Education: Goal: Ability to demonstrate management of disease process will improve Outcome: Progressing Goal: Ability to verbalize understanding of medication therapies will improve Outcome: Progressing Goal: Individualized Educational Video(s) Outcome: Progressing   Problem: Activity: Goal: Capacity to carry out activities will improve Outcome: Progressing   Problem: Cardiac: Goal: Ability to achieve and maintain adequate cardiopulmonary perfusion will improve Outcome: Progressing   Problem: Education: Goal: Ability to describe self-care measures that may prevent or decrease complications (Diabetes Survival Skills Education) will improve Outcome: Progressing Goal: Individualized Educational Video(s) Outcome: Progressing   Problem: Coping: Goal: Ability to adjust to condition or change in health will improve Outcome: Progressing   Problem: Fluid Volume: Goal: Ability to maintain a balanced intake and output will improve Outcome: Progressing   Problem: Health Behavior/Discharge Planning: Goal: Ability to identify and utilize available resources and services will improve Outcome: Progressing Goal: Ability to manage health-related needs will improve Outcome: Progressing   Problem: Metabolic: Goal: Ability to maintain appropriate glucose levels will improve Outcome: Progressing   Problem: Nutritional: Goal: Maintenance of adequate nutrition will improve Outcome: Progressing Goal: Progress toward achieving an optimal weight will improve Outcome: Progressing   Problem: Skin Integrity: Goal: Risk for impaired skin integrity will decrease Outcome: Progressing   Problem: Tissue Perfusion: Goal: Adequacy of tissue perfusion will improve Outcome: Progressing   Problem: Education: Goal: Knowledge of General Education information will improve Description: Including pain rating scale, medication(s)/side effects and non-pharmacologic comfort measures Outcome:  Progressing   Problem: Health Behavior/Discharge Planning: Goal: Ability to manage health-related needs will improve Outcome: Progressing   Problem: Clinical Measurements: Goal: Ability to maintain clinical measurements within normal limits will improve Outcome: Progressing Goal: Will remain free from infection Outcome: Progressing Goal: Diagnostic test results will improve Outcome: Progressing Goal: Respiratory complications will improve Outcome: Progressing Goal: Cardiovascular complication will be avoided Outcome: Progressing   Problem: Activity: Goal: Risk for activity intolerance will decrease Outcome: Progressing   Problem: Coping: Goal: Level of anxiety will decrease Outcome: Progressing   Problem: Elimination: Goal: Will not experience complications related to bowel motility Outcome: Progressing Goal: Will not experience complications related to urinary retention Outcome: Progressing   Problem: Pain Managment: Goal: General experience of comfort will improve Outcome: Progressing   Problem: Safety: Goal: Ability to remain free from injury will improve Outcome: Progressing   Problem: Skin Integrity: Goal: Risk for impaired skin integrity will decrease Outcome: Progressing

## 2021-11-26 NOTE — Progress Notes (Signed)
Patient is alert and oriented x 4 and appropriate for discharge. Pt was educated on her medications and low salt diet and healthier food prep prior to discharge. Her daughter [name] and granddaughter took part in all teaching as well. All three verbalized understanding teaching and the importance of keeping follow up appointments.  No signs of distress noted. VS taken and recorded prior to discharge and are within normal limits

## 2021-11-28 ENCOUNTER — Telehealth: Payer: Self-pay

## 2021-11-28 ENCOUNTER — Other Ambulatory Visit: Payer: Self-pay | Admitting: Family Medicine

## 2021-11-28 DIAGNOSIS — F419 Anxiety disorder, unspecified: Secondary | ICD-10-CM

## 2021-11-28 NOTE — Telephone Encounter (Signed)
Caller Name: [place]  Call back phone #: [phone]  Reason for Call: Called to ask that we fill eslitalopram, they sent a fax to [place] last Wednesday.

## 2021-11-28 NOTE — Telephone Encounter (Signed)
Spoke with patient's daughter [name] and scheduled a Mychart Palliative Consult for 12/12/21 @ 2 PM.  Consent obtained; updated Netsmart, Team List and Epic.

## 2021-11-29 MED ORDER — ESCITALOPRAM OXALATE 10 MG PO TABS: 10.0000 mg | ORAL_TABLET | Freq: Every day | ORAL | 0 refills | Status: DC

## 2021-11-29 NOTE — Telephone Encounter (Signed)
Please advise, see below.   Chart supports rx. Last OV: 11/10/2021 Next OV: 01/17/2022

## 2021-12-01 ENCOUNTER — Other Ambulatory Visit: Payer: Self-pay | Admitting: Family Medicine

## 2021-12-01 ENCOUNTER — Telehealth: Payer: Self-pay | Admitting: Family Medicine

## 2021-12-01 DIAGNOSIS — J449 Chronic obstructive pulmonary disease, unspecified: Secondary | ICD-10-CM

## 2021-12-01 MED ORDER — NEBULIZER SYSTEM ALL-IN-ONE MISC: 1.0000 | 0 refills | Status: DC | PRN

## 2021-12-01 NOTE — Telephone Encounter (Signed)
Please advise, see below.   

## 2021-12-01 NOTE — Telephone Encounter (Signed)
Caller Name: [place] Call back phone #: [phone]  Reason for Call: Pt was released from hospital and having trouble breathing. She said she should contact her Pcp for a nebulizer. I suggested an appt for a hosp fu but she is hoping you will just call it in.

## 2021-12-01 NOTE — Telephone Encounter (Signed)
Spoke with [name] and advised her that we have sent in her nebulizer to her pharmacy. Patient requested a stronger medication than Lexapro.Spoke with Dr. Grandville Silos and he advised an appointment was needed. Offered patient appt for 12/05/21 1:40 pm and she accepted. I advised patient that if she starts to feel worse over the weekend to visit the ED and she verbalized understanding.

## 2021-12-05 ENCOUNTER — Encounter: Payer: Self-pay | Admitting: Family Medicine

## 2021-12-05 ENCOUNTER — Other Ambulatory Visit: Payer: Self-pay

## 2021-12-05 ENCOUNTER — Ambulatory Visit (INDEPENDENT_AMBULATORY_CARE_PROVIDER_SITE_OTHER): Payer: PPO | Admitting: Family Medicine

## 2021-12-05 VITALS — BP 136/86 | HR 77

## 2021-12-05 DIAGNOSIS — N189 Chronic kidney disease, unspecified: Secondary | ICD-10-CM

## 2021-12-05 DIAGNOSIS — R0902 Hypoxemia: Secondary | ICD-10-CM

## 2021-12-05 DIAGNOSIS — I5032 Chronic diastolic (congestive) heart failure: Secondary | ICD-10-CM | POA: Diagnosis not present

## 2021-12-05 DIAGNOSIS — F419 Anxiety disorder, unspecified: Secondary | ICD-10-CM | POA: Diagnosis not present

## 2021-12-05 DIAGNOSIS — J449 Chronic obstructive pulmonary disease, unspecified: Secondary | ICD-10-CM

## 2021-12-05 DIAGNOSIS — N179 Acute kidney failure, unspecified: Secondary | ICD-10-CM

## 2021-12-05 DIAGNOSIS — F332 Major depressive disorder, recurrent severe without psychotic features: Secondary | ICD-10-CM | POA: Insufficient documentation

## 2021-12-05 MED ORDER — ESCITALOPRAM OXALATE 20 MG PO TABS: 20.0000 mg | ORAL_TABLET | Freq: Every day | ORAL | 0 refills | Status: DC

## 2021-12-05 MED ORDER — HYDROXYZINE HCL 25 MG PO TABS: 25.0000 mg | ORAL_TABLET | Freq: Three times a day (TID) | ORAL | 0 refills | Status: DC | PRN

## 2021-12-05 NOTE — Assessment & Plan Note (Signed)
Elevated PHQ-9 GAD-7, no SI or HI Increase escitalopram 20 mg plus Atarax 25 mg as needed as prescribed Follow-up 1 month

## 2021-12-05 NOTE — Patient Instructions (Addendum)
For anxiety with panic attacks, increase escitalopram to 20 mg and increase hydroxyzine to 25 mg as needed for itching and anxiety/panic attacks.  For kidney, we are rechecking blood work.  We are sending in DME oxygen and nebulizer to [facility].

## 2021-12-05 NOTE — Assessment & Plan Note (Signed)
Has upcoming follow-up with cardiology scheduled Also had worsening renal function and chronic dyspnea Recheck BMP Home O2 for hypoxia

## 2021-12-05 NOTE — Assessment & Plan Note (Signed)
At home nebulizer DME ordered Denies any wheezing

## 2021-12-05 NOTE — Progress Notes (Signed)
Assessment/Plan:   Problem List Items Addressed This Visit       Cardiovascular and Mediastinum   Chronic diastolic congestive heart failure ([place]) - Primary (Chronic)    Has upcoming follow-up with cardiology scheduled Also had worsening renal function and chronic dyspnea Recheck BMP Home O2 for hypoxia      Relevant Orders   For home use only DME oxygen     Respiratory   COPD with chronic bronchitis ([place])    At home nebulizer DME ordered Denies any wheezing      Relevant Orders   For home use only DME Nebulizer machine     Genitourinary   Acute kidney injury superimposed on chronic kidney disease ([place])   Relevant Orders   Basic Metabolic Panel (BMET)     Other   Anxiety   Relevant Medications   escitalopram (LEXAPRO) 20 MG tablet   hydrOXYzine (ATARAX) 25 MG tablet   Severe episode of recurrent major depressive disorder, without psychotic features (HCC)    Elevated PHQ-9 GAD-7, no SI or HI Increase escitalopram 20 mg plus Atarax 25 mg as needed as prescribed Follow-up 1 month      Relevant Medications   escitalopram (LEXAPRO) 20 MG tablet   hydrOXYzine (ATARAX) 25 MG tablet   Other Visit Diagnoses     Chronic obstructive pulmonary disease, unspecified COPD type ([place])       Relevant Orders   For home use only DME oxygen   Hypoxia       Relevant Orders   For home use only DME oxygen          Subjective:  HPI:  [name] is a 87 y.o. female who has Hyperlipidemia; Class 3 obesity ([place]); Microcytic anemia; TENSION HEADACHE; Essential hypertension; Chronic diastolic congestive heart failure ([place]); Long term current use of anticoagulant therapy; Insomnia; Estrogen deficiency; COPD with chronic bronchitis ([place]); CN (constipation); Gout; Acute kidney injury superimposed on chronic kidney disease ([place]); Vitamin D deficiency; Endometrial polyp; GERD with esophagitis; Dementia ([place]); Type 2 diabetes mellitus with hyperlipidemia ([place]); Moderate  nonproliferative diabetic retinopathy of both eyes without macular edema associated with type 2 diabetes mellitus ([place]); Chronic radicular lumbar pain; Lumbar spondylosis; Spinal stenosis, lumbar region, with neurogenic claudication; Bilateral primary osteoarthritis of knee; Iron deficiency anemia; Right elbow pain; Chronic pain of right hand; Breast cancer ([place]); Anxiety; Acute on chronic diastolic (congestive) heart failure ([place]); AKI (acute kidney injury) ([place]); and Severe episode of recurrent major depressive disorder, without psychotic features ([place]) on their problem list..   She  has a past medical history of Abdominal pain (01/26/2012), Acute kidney injury superimposed on chronic kidney disease ([place]) (12/18/2014), Allergy, Anemia, unspecified, Anxiety, Anxiety associated with depression (06/08/2017), Arthritis, Breast cancer ([place]), Breast cancer screening (02/19/2014), Breast mass (10/27/2014), Chronic diastolic CHF (congestive heart failure) ([place]), Chronic pain syndrome (02/06/2015), CKD (chronic kidney disease), stage III ([place]), Coronary artery disease, Debility (08/01/2012), Dementia ([place]), Depression, Diabetes mellitus, DOE (dyspnea on exertion) (06/24/2014), Dysphagia, unspecified(787.20) (07/31/2012), Fungal dermatitis (11/13/2007), GERD (gastroesophageal reflux disease), Gout, unspecified, Headache, History of blood clots, History of deep venous thrombosis (01/27/2012), History of pulmonary embolus (PE) (04/22/2014), Hyperkalemia (07/26/2017), Hyperlipidemia, Hypertension, Insomnia, Joint pain, Joint swelling, Morbid obesity ([place]), Multiple benign nevi (11/15/2016), Myocardial infarction ([place]) (2004), Non-STEMI (non-ST elevated myocardial infarction) ([place]) (02/15/2020), Numbness, Obstructive sleep apnea, Osteoarthritis, Osteoarthritis, Osteoarthrosis, unspecified whether generalized or localized, lower leg, Pain in the chest (12/31/2013), Pain, chronic, Personal history of other diseases of the digestive system  (12/03/2006), Polymyalgia rheumatica (  [place]), Presence of stent in right coronary artery, Pulmonary emboli ([place]) (01/2012), Situational depression (06/04/2009), Syncope, vasovagal (07/04/2021), Urinary incontinence, Vaginitis and vulvovaginitis (06/23/2016), Vertigo, and Wears dentures..   She presents with chief complaint of Follow-up (Anxiety follow up. Patient requesting a stronger medication than Lexapro. ) .   Recent hospitalization 2 weeks ago for heart failure.  Status post diuresis.  Reports stable weights.  Did have to use O2 in hospital at night due to hypoxia.  Reports chronic dyspnea although is slightly improved since leaving the hospital.  On discharge patient did have worsening renal function with creatinine in the twos, baseline 1.5.  Depression/Anxiety, established problem, worsening Current Medications: Escitalopram 10 mg, Atarax 10 mg as needed Side Effects: None Current Symptoms/Interim History: Reports minimal improvement in symptoms, reports ongoing panic attacks     12/05/2021    1:50 PM  Depression screen PHQ 2/9  Decreased Interest 3  Down, Depressed, Hopeless 3  PHQ - 2 Score 6  Altered sleeping 2  Tired, decreased energy 3  Change in appetite 2  Feeling bad or failure about yourself  3  Trouble concentrating 3  Moving slowly or fidgety/restless 2  Suicidal thoughts 0  PHQ-9 Score 21  Difficult doing work/chores Not difficult at all       12/05/2021    1:51 PM  GAD 7 : Generalized Anxiety Score  Nervous, Anxious, on Edge 3  Control/stop worrying 3  Worry too much - different things 3  Trouble relaxing 3  Restless 3  Easily annoyed or irritable 3  Afraid - awful might happen 3  Total GAD 7 Score 21  Anxiety Difficulty Extremely difficult    ROS: No SI or HI.   Past Surgical History:  Procedure Laterality Date   ABDOMINAL HYSTERECTOMY     partial   APPENDECTOMY     blood clots/legs and lungs  2013   BREAST BIOPSY Left 07/22/2014   BREAST BIOPSY  Left 02/10/2013   BREAST LUMPECTOMY Left 11/05/2014   BREAST LUMPECTOMY WITH RADIOACTIVE SEED LOCALIZATION Left 11/05/2014   Procedure: LEFT BREAST LUMPECTOMY WITH RADIOACTIVE SEED LOCALIZATION;  Surgeon: [name], MD;  Location: [facility];  Service: General;  Laterality: Left;   CARDIAC CATHETERIZATION     COLONOSCOPY     CORONARY ANGIOPLASTY  2   CORONARY STENT INTERVENTION N/A 02/17/2020   Procedure: CORONARY STENT INTERVENTION;  Surgeon: Leonie Man, MD;  Location: [facility];  Service: Cardiovascular;  Laterality: N/A;   ESOPHAGOGASTRODUODENOSCOPY (EGD) WITH PROPOFOL N/A 11/07/2016   Procedure: ESOPHAGOGASTRODUODENOSCOPY (EGD) WITH PROPOFOL;  Surgeon: Gatha Mayer, MD;  Location: [facility];  Service: Endoscopy;  Laterality: N/A;   EXCISION OF SKIN TAG Right 11/05/2014   Procedure: EXCISION OF RIGHT EYELID SKIN TAG;  Surgeon: [name], MD;  Location: [facility];  Service: General;  Laterality: Right;   EYE SURGERY Bilateral    cataract    GASTRIC BYPASS  1977    reversed in 1979, [facility] CATH AND CORONARY ANGIOGRAPHY N/A 02/17/2020   Procedure: LEFT HEART CATH AND CORONARY ANGIOGRAPHY;  Surgeon: Leonie Man, MD;  Location: [facility];  Service: Cardiovascular;  Laterality: N/A;   LEFT HEART CATHETERIZATION WITH CORONARY ANGIOGRAM N/A 06/29/2014   Procedure: LEFT HEART CATHETERIZATION WITH CORONARY ANGIOGRAM;  Surgeon: Troy Sine, MD;  Location: [facility];  Service: Cardiovascular;  Laterality: N/A;   MEMBRANE PEEL Right 10/23/2018   Procedure: MEMBRANE PEEL;  Surgeon: [name],  [name], MD;  Location: [facility];  Service: Ophthalmology;  Laterality: Right;   MI with stent placement  2004   PARS PLANA VITRECTOMY Right 10/23/2018   Procedure: PARS PLANA VITRECTOMY WITH 25 GAUGE;  Surgeon: Hurman Horn, MD;  Location: [facility];  Service: Ophthalmology;  Laterality: Right;    Outpatient Medications Prior to Visit  Medication Sig Dispense  Refill   albuterol (PROAIR HFA) 108 (90 Base) MCG/ACT inhaler Inhale 1-2 puffs into the lungs every 6 (six) hours as needed for wheezing or shortness of breath. 6.7 g 1   anastrozole (ARIMIDEX) 1 MG tablet TAKE 1 TABLET BY MOUTH EVERY DAY (Patient taking differently: Take 1 mg by mouth daily.) 28 tablet 1   ASPIRIN LOW DOSE 81 MG EC tablet TAKE 1 TABLET BY MOUTH EVERY DAY (Patient taking differently: Take 81 mg by mouth daily.) 30 tablet 11   atorvastatin (LIPITOR) 80 MG tablet TAKE 1 TABLET BY MOUTH EVERY DAY (Patient taking differently: Take 80 mg by mouth daily.) 90 tablet 3   budesonide-formoterol (SYMBICORT) 160-4.5 MCG/ACT inhaler Inhale 2 puffs into the lungs 2 (two) times daily. (Patient taking differently: Inhale 2 puffs into the lungs 2 (two) times daily as needed (shortness of breath/wheezing).) 1 each 3   carvedilol (COREG) 12.5 MG tablet TAKE 1 TABLET BY MOUTH 2 TIMES DAILY (Patient taking differently: Take 12.5 mg by mouth 2 (two) times daily with a meal.) 180 tablet 1   cetirizine (ZYRTEC) 10 MG tablet Take 10 mg by mouth daily as needed for allergies or rhinitis.     clopidogrel (PLAVIX) 75 MG tablet Take 1 tablet (75 mg total) by mouth daily. 90 tablet 3   Continuous Blood Gluc Sensor (FREESTYLE LIBRE 14 DAY SENSOR) MISC PLACE 1 DEVICE ON THE SKIN AS DIRECTED EVERY 14 DAYS 6 each 0   diclofenac Sodium (VOLTAREN) 1 % GEL Apply 2 g topically 4 (four) times daily. (Patient taking differently: Apply 1 Application topically 4 (four) times daily as needed (joint pain).) 150 g 2   febuxostat (ULORIC) 40 MG tablet TAKE 1 TABLET BY MOUTH EVERY DAY (Patient taking differently: Take 40 mg by mouth daily.) 30 tablet 5   fluticasone (FLONASE) 50 MCG/ACT nasal spray Place 1 spray into both nostrils in the morning and at bedtime. (Patient taking differently: Place 1 spray into both nostrils 2 (two) times daily as needed for allergies or rhinitis.) 07.8 mL 3   folic acid (FOLVITE) 1 MG tablet TAKE 1  TABLET BY MOUTH EVERY DAY (Patient taking differently: Take 1 mg by mouth daily.) 90 tablet 1   guaiFENesin (MUCINEX) 600 MG 12 hr tablet Take 1 tablet (600 mg total) by mouth 2 (two) times daily as needed. 60 tablet 3   guaifenesin (ROBITUSSIN) 100 MG/5ML syrup Take 200 mg by mouth 3 (three) times daily as needed for cough.     insulin aspart (NOVOLOG FLEXPEN) 100 UNIT/ML FlexPen Inject 14 Units into the skin 3 (three) times daily before meals.     insulin glargine, 2 Unit Dial, (TOUJEO MAX SOLOSTAR) 300 UNIT/ML Solostar Pen Inject 42 Units into the skin daily. Patient assistance program provides (Patient taking differently: Inject 42 Units into the skin every morning. Patient assistance program provides) 15 mL 6   ipratropium-albuterol (DUONEB) 0.5-2.5 (3) MG/3ML SOLN Take 3 mLs by nebulization every 6 (six) hours as needed. 360 mL 0   linaclotide (LINZESS) 145 MCG CAPS capsule Take 1 capsule (145 mcg total) by mouth daily before breakfast. (  Patient taking differently: Take 145 mcg by mouth daily as needed (constipation).) 30 capsule 2   meclizine (ANTIVERT) 25 MG tablet TAKE 1 TABLET BY MOUTH THREE TIMES DAILY AS NEEDED FOR DIZZINESS (Patient taking differently: Take 25 mg by mouth 2 (two) times daily as needed for dizziness.) 30 tablet 11   memantine (NAMENDA) 5 MG tablet TAKE 2 TABLETS BY MOUTH EVERY DAY and TAKE 1 TABLET BY MOUTH EVERY EVENING (Patient taking differently: Take 5-10 mg by mouth See admin instructions. Take 2 tablets (10 mg) by mouth every morning and 1 tablet (5 mg) every evening) 270 tablet 5   Nebulizer System All-In-One MISC 1 kit by Does not apply route as needed. 1 each 0   nitroGLYCERIN (NITROSTAT) 0.4 MG SL tablet Take one tablet under the tongue every 5 minutes as needed for chest pain (Patient taking differently: Place 0.4 mg under the tongue every 5 (five) minutes as needed for chest pain.) 50 tablet 0   nystatin cream (MYCOSTATIN) Apply 1 application topically 2 (two)  times daily. (Patient taking differently: Apply 1 application  topically 2 (two) times daily as needed for dry skin.) 30 g 0   OVER THE COUNTER MEDICATION Take 1 capsule by mouth every other day. Over the counter stool softener - unknown type     oxyCODONE-acetaminophen (PERCOCET) 7.5-325 MG tablet Take 1 tablet by mouth every 12 (twelve) hours as needed for moderate pain or severe pain. Must last 30 days. 60 tablet 0   pantoprazole (PROTONIX) 20 MG tablet Take 1 tablet (20 mg total) by mouth daily. 30 tablet 5   polyethylene glycol powder (MIRALAX) 17 GM/SCOOP powder 17 gm mix with liquid twice daily as needed 255 g 0   Respiratory Therapy Supplies (NEBULIZER/TUBING/MOUTHPIECE) KIT 1 Units by Does not apply route as needed (for breathing treatments). 1 kit 0   sodium polystyrene (SPS) 15 GM/60ML suspension take 71ms by MOUTH ONCE WEEKLY. TO keep potassium down (Patient taking differently: Take 15 g by mouth every Sunday. To keep potassium down) 240 mL 2   torsemide (DEMADEX) 20 MG tablet Take 1 tablet (20 mg total) by mouth daily. Take an extra dose for weight gain, 3lbs in 1 day or 5lbs in 1 week 30 tablet 1   Vitamin D, Ergocalciferol, (DRISDOL) 1.25 MG (50000 UNIT) CAPS capsule TAKE 1 CAPSULE BY MOUTH every 7 days (Patient taking differently: Take 50,000 Units by mouth every Monday.) 4 capsule 1   escitalopram (LEXAPRO) 10 MG tablet Take 1 tablet (10 mg total) by mouth daily. Take 1/2 tablet in 2 weeks, then go to whole tablet 30 tablet 0   hydrOXYzine (ATARAX) 10 MG tablet Take 1 tablet (10 mg total) by mouth every 8 (eight) hours as needed for anxiety. (Patient taking differently: Take 10 mg by mouth every 8 (eight) hours as needed for anxiety or itching.) 15 tablet 0   No facility-administered medications prior to visit.    Family History  Problem Relation Age of Onset   Breast cancer Mother 618  Heart disease Mother    Throat cancer Father    Hypertension Father    Arthritis Father     Diabetes Father    Arthritis Sister    Obesity Sister    Diabetes Sister    Heart disease Cousin    Colon cancer Neg Hx    Stomach cancer Neg Hx    Esophageal cancer Neg Hx     Social History   Socioeconomic History  Marital status: Widowed    Spouse name: Not on file   Number of children: 3   Years of education: Not on file   Highest education level: High school graduate  Occupational History   Occupation: retired  Tobacco Use   Smoking status: Never    Passive exposure: Past   Smokeless tobacco: Never   Tobacco comments:    Both parents smoked, patient was exposed/ "Raised up in smoke, my whole life."  Vaping Use   Vaping Use: Never used  Substance and Sexual Activity   Alcohol use: No    Alcohol/week: 0.0 standard drinks of alcohol   Drug use: No   Sexual activity: Not Currently  Other Topics Concern   Not on file  Social History Narrative   Not on file   Social Determinants of Health   Financial Resource Strain: Low Risk  (10/14/2021)   Overall Financial Resource Strain (CARDIA)    Difficulty of Paying Living Expenses: Not very hard  Food Insecurity: No Food Insecurity (10/14/2021)   Hunger Vital Sign    Worried About Running Out of Food in the Last Year: Never true    Ran Out of Food in the Last Year: Never true  Transportation Needs: No Transportation Needs (10/14/2021)   PRAPARE - Hydrologist (Medical): No    Lack of Transportation (Non-Medical): No  Physical Activity: Inactive (07/26/2017)   Exercise Vital Sign    Days of Exercise per Week: 0 days    Minutes of Exercise per Session: 0 min  Stress: Stress Concern Present (07/26/2017)   Echo    Feeling of Stress : Rather much  Social Connections: Moderately Integrated (07/26/2017)   Social Connection and Isolation Panel [NHANES]    Frequency of Communication with Friends and Family: Once a week    Frequency  of Social Gatherings with Friends and Family: Three times a week    Attends Religious Services: 1 to 4 times per year    Active Member of Clubs or Organizations: Yes    Attends Archivist Meetings: 1 to 4 times per year    Marital Status: Widowed  Intimate Partner Violence: Not At Risk (07/26/2017)   Humiliation, Afraid, Rape, and Kick questionnaire    Fear of Current or Ex-Partner: No    Emotionally Abused: No    Physically Abused: No    Sexually Abused: No                                                                                                 Objective:  Physical Exam: BP 136/86 (BP Location: Left Arm, Patient Position: Sitting, Cuff Size: Large)   Pulse 77   SpO2 98%    General: No acute distress. Awake and conversant.  Eyes: Normal conjunctiva, anicteric. Round symmetric pupils.  ENT: Hearing grossly intact. No nasal discharge.  Neck: Neck is supple. No masses or thyromegaly.  Respiratory: Respirations are non-labored. No auditory wheezing.  Skin: Warm. No rashes or ulcers.  Psych: Alert and oriented. Cooperative, Appropriate mood and  affect, Normal judgment.  CV: No cyanosis or JVD MSK: Wheelchair-bound, unable to ambulate no clubbing  Neuro: Sensation and CN II-XII grossly normal.        Alesia Banda, MD, MS

## 2021-12-06 ENCOUNTER — Other Ambulatory Visit: Payer: Self-pay | Admitting: Family Medicine

## 2021-12-06 DIAGNOSIS — I2510 Atherosclerotic heart disease of native coronary artery without angina pectoris: Secondary | ICD-10-CM | POA: Diagnosis not present

## 2021-12-06 DIAGNOSIS — E1140 Type 2 diabetes mellitus with diabetic neuropathy, unspecified: Secondary | ICD-10-CM | POA: Diagnosis not present

## 2021-12-06 DIAGNOSIS — J449 Chronic obstructive pulmonary disease, unspecified: Secondary | ICD-10-CM | POA: Diagnosis not present

## 2021-12-06 DIAGNOSIS — G894 Chronic pain syndrome: Secondary | ICD-10-CM | POA: Diagnosis not present

## 2021-12-06 DIAGNOSIS — N1831 Chronic kidney disease, stage 3a: Secondary | ICD-10-CM | POA: Diagnosis not present

## 2021-12-06 DIAGNOSIS — F0390 Unspecified dementia without behavioral disturbance: Secondary | ICD-10-CM | POA: Diagnosis not present

## 2021-12-06 DIAGNOSIS — Z794 Long term (current) use of insulin: Secondary | ICD-10-CM | POA: Diagnosis not present

## 2021-12-06 DIAGNOSIS — E875 Hyperkalemia: Secondary | ICD-10-CM

## 2021-12-06 DIAGNOSIS — I5033 Acute on chronic diastolic (congestive) heart failure: Secondary | ICD-10-CM | POA: Diagnosis not present

## 2021-12-06 DIAGNOSIS — E1122 Type 2 diabetes mellitus with diabetic chronic kidney disease: Secondary | ICD-10-CM | POA: Diagnosis not present

## 2021-12-06 DIAGNOSIS — I130 Hypertensive heart and chronic kidney disease with heart failure and stage 1 through stage 4 chronic kidney disease, or unspecified chronic kidney disease: Secondary | ICD-10-CM | POA: Diagnosis not present

## 2021-12-06 LAB — BASIC METABOLIC PANEL
BUN: 57 mg/dL — ABNORMAL HIGH (ref 6–23)
CO2: 27 mEq/L (ref 19–32)
Calcium: 9.1 mg/dL (ref 8.4–10.5)
Chloride: 102 mEq/L (ref 96–112)
Creatinine, Ser: 1.9 mg/dL — ABNORMAL HIGH (ref 0.40–1.20)
GFR: 23.84 mL/min — ABNORMAL LOW (ref >60.00)
Glucose, Bld: 186 mg/dL — ABNORMAL HIGH (ref 70–99)
Potassium: 5.6 mEq/L — ABNORMAL HIGH (ref 3.5–5.1)
Sodium: 136 mEq/L (ref 135–145)

## 2021-12-06 MED ORDER — SODIUM POLYSTYRENE SULFONATE 15 GM/60ML PO SUSP: ORAL | 2 refills | Status: DC

## 2021-12-07 ENCOUNTER — Encounter: Payer: Self-pay | Admitting: Hematology

## 2021-12-07 ENCOUNTER — Encounter (HOSPITAL_COMMUNITY): Payer: Self-pay

## 2021-12-09 ENCOUNTER — Ambulatory Visit: Payer: HMO | Admitting: Nurse Practitioner

## 2021-12-12 ENCOUNTER — Telehealth: Payer: Self-pay | Admitting: Family Medicine

## 2021-12-12 ENCOUNTER — Encounter (HOSPITAL_COMMUNITY): Payer: Self-pay

## 2021-12-12 ENCOUNTER — Encounter: Payer: Self-pay | Admitting: Hematology

## 2021-12-12 ENCOUNTER — Encounter: Payer: PPO | Admitting: Internal Medicine

## 2021-12-12 NOTE — Telephone Encounter (Signed)
Tried calling patient regarding annotation below to offer appointment, but didn't receive an answer.

## 2021-12-12 NOTE — Progress Notes (Deleted)
[facility] Consult Note Telephone: [phone]  Fax: [phone]   Date of encounter: 12/12/21 9:11 AM PATIENT NAME: [name] [place] [id number]   [phone] (home)  DOB: 11-12-1934 MRN: [id number] PRIMARY CARE PROVIDER:    Bonnita Hollow, MD,  [place] [address] [phone]  REFERRING PROVIDER:   Bonnita Hollow, MD [place],  [place] [address] [phone]  RESPONSIBLE PARTY:    Contact Information     Name Relation Home Work [place] [phone] Daughter [phone]  [phone]   [name] Daughter [phone]  [phone]   [place] Sister   [phone]        Due to the COVID-19 crisis, this visit was done via telemedicine from my office and it was initiated and consent by this patient and or family.  I connected with  [name] OR PROXY on 12/12/21 by a video enabled telemedicine application and verified that I am speaking with the correct person using two identifiers.   I discussed the limitations of evaluation and management by telemedicine. The patient expressed understanding and agreed to proceed.  Palliative Care was asked to follow this patient by consultation request of  Bonnita Hollow, MD to address advance care planning and complex medical decision making. This is the initial visit.                                     ASSESSMENT AND PLAN / RECOMMENDATIONS:   Advance Care Planning/Goals of Care: Goals include to maximize quality of life and symptom management. Patient/health care surrogate gave his/her permission to discuss.Our advance care planning conversation included a discussion about:    The value and importance of advance care planning  Experiences with loved ones who have been seriously ill or have died  Exploration of personal, cultural or spiritual beliefs that might influence  medical decisions  Exploration of goals of care in the event of a sudden injury or illness  Identification  of a healthcare agent  Review and updating or creation of an  advance directive document . Decision not to resuscitate or to de-escalate disease focused treatments due to poor prognosis. CODE STATUS:  Symptom Management/Plan:    Follow up Palliative Care Visit: Palliative care will continue to follow for complex medical decision making, advance care planning, and clarification of goals. Return ***  I spent *** minutes providing this consultation. More than 50% of the time in this consultation was spent in counseling and care coordination.  This visit was coded based on medical decision making (MDM).***  PPS: ***0%  HOSPICE ELIGIBILITY/DIAGNOSIS: ***/***  Chief Complaint: ***  HISTORY OF PRESENT ILLNESS:  [name] is a 87 y.o. year old female  with *** .   History obtained from review of EMR, discussion with primary team, and interview with family, facility staff/caregiver and/or [name].   I reviewed available labs, medications, imaging, studies and related documents from the EMR.  Records reviewed and summarized above.   ROS  Review of Systems  Physical Exam: There were no vitals filed for this visit. There is no height or weight on file to calculate BMI. Wt Readings from Last 500 Encounters:  11/26/21 (!) 304 lb 14.3 oz (138.3 kg)  10/27/21 (!) 309 lb (140.2 kg)  10/25/21 (!) 310 lb (140.6 kg)  10/22/21 [name])  315 lb 14.7 oz (143.3 kg)  10/11/21 (!) 342 lb (155.1 kg)  08/24/21 (!) 342 lb 9.6 oz (155.4 kg)  08/23/21 (!) 309 lb (140.2 kg)  07/19/21 (!) 333 lb (151 kg)  07/08/21 (!) 311 lb 1.1 oz (141.1 kg)  05/18/21 (!) 313 lb (142 kg)  04/27/21 (!) 316 lb (143.3 kg)  03/09/21 (!) 316 lb 8 oz (143.6 kg)  01/26/21 (!) 319 lb (144.7 kg)  12/21/20 (!) 331 lb (150.1 kg)  12/17/20 (!) 331 lb 12.8 oz (150.5 kg)  10/12/20 (!) 332 lb (150.6 kg)  09/29/20 (!)  332 lb (150.6 kg)  09/07/20 (!) 330 lb 6.4 oz (149.9 kg)  07/20/20 (!) 338 lb (153.3 kg)  06/03/20 (!) 338 lb (153.3 kg)  04/12/20 (!) 338 lb (153.3 kg)  03/18/20 (!) 338 lb (153.3 kg)  03/03/20 (!) 338 lb (153.3 kg)  03/02/20 (!) 338 lb (153.3 kg)  02/23/20 (!) 338 lb 1.6 oz (153.4 kg)  02/23/20 (!) 338 lb (153.3 kg)  02/14/20 (!) 353 lb (160.1 kg)  12/23/19 (!) 329 lb (149.2 kg)  09/12/19 (!) 320 lb (145.2 kg)  07/29/19 (!) 323 lb (146.5 kg)  07/24/19 (!) 322 lb (146.1 kg)  06/03/19 (!) 325 lb (147.4 kg)  05/26/19 (!) 312 lb (141.5 kg)  03/31/19 300 lb (136.1 kg)  03/24/19 (!) 330 lb (149.7 kg)  02/12/19 (!) 323 lb 3.2 oz (146.6 kg)  01/30/19 (!) 325 lb 9.9 oz (147.7 kg)  12/24/18 (!) 325 lb (147.4 kg)  12/20/18 (!) 323 lb (146.5 kg)  12/18/18 (!) 329 lb 12.8 oz (149.6 kg)  12/18/18 (!) 329 lb 12.8 oz (149.6 kg)  10/23/18 (!) 325 lb (147.4 kg)  07/17/18 (!) 323 lb (146.5 kg)  07/03/18 (!) 323 lb (146.5 kg)  06/03/18 (!) 324 lb (147 kg)  05/28/18 (!) 325 lb 1.6 oz (147.5 kg)  04/12/18 (!) 323 lb (146.5 kg)  03/11/18 (!) 321 lb (145.6 kg)  12/11/17 (!) 327 lb (148.3 kg)  11/28/17 (!) 325 lb (147.4 kg)  11/27/17 (!) 325 lb 12.8 oz (147.8 kg)  11/21/17 300 lb (136.1 kg)  11/05/17 (!) 323 lb (146.5 kg)  10/30/17 (!) 327 lb 12.8 oz (148.7 kg)  10/24/17 (!) 333 lb 12.8 oz (151.4 kg)  10/23/17 (!) 332 lb 11.2 oz (150.9 kg)  10/10/17 (!) 341 lb 14.9 oz (155.1 kg)  09/07/17 (!) 341 lb (154.7 kg)  08/27/17 (!) 350 lb (158.8 kg)  08/02/17 (!) 350 lb (158.8 kg)  07/26/17 (!) 350 lb 1.5 oz (158.8 kg)  06/08/17 (!) 345 lb (156.5 kg)  05/21/17 (!) 336 lb (152.4 kg)  04/23/17 (!) 340 lb (154.2 kg)  04/02/17 (!) 347 lb (157.4 kg)  04/02/17 (!) 347 lb (157.4 kg)  03/21/17 (!) 341 lb 9.6 oz (154.9 kg)  02/27/17 (!) 343 lb (155.6 kg)  02/26/17 (!) 343 lb (155.6 kg)  01/29/17 (!) 344 lb 3.2 oz (156.1 kg)  01/01/17 (!) 341 lb (154.7 kg)  12/12/16 (!) 341 lb (154.7 kg)  12/06/16 (!)  341 lb (154.7 kg)  11/15/16 (!) 346 lb 12.8 oz (157.3 kg)  11/06/16 (!) 340 lb 3.2 oz (154.3 kg)  10/26/16 (!) 333 lb (151 kg)  10/13/16 (!) 333 lb (151 kg)  10/09/16 (!) 333 lb (151 kg)  09/11/16 (!) 337 lb (152.9 kg)  08/14/16 (!) 341 lb (154.7 kg)  08/02/16 (!) 338 lb (153.3 kg)  07/27/16 (!) 338 lb 6.4 oz (153.5 kg)  07/23/16 (!) 341  lb (154.7 kg)  07/03/16 (!) 344 lb 6.4 oz (156.2 kg)  06/23/16 (!) 339 lb 9.6 oz (154 kg)  05/22/16 (!) 339 lb 6.4 oz (154 kg)  04/24/16 (!) 344 lb (156 kg)  03/27/16 (!) 342 lb (155.1 kg)  03/20/16 (!) 341 lb (154.7 kg)  02/21/16 (!) 349 lb (158.3 kg)  02/02/16 (!) 343 lb (155.6 kg)  01/24/16 (!) 343 lb (155.6 kg)  12/13/15 (!) 341 lb 12.8 oz (155 kg)  12/08/15 (!) 344 lb (156 kg)  12/06/15 (!) 347 lb (157.4 kg)  11/01/15 (!) 343 lb 9.6 oz (155.9 kg)  10/11/15 (!) 340 lb (154.2 kg)  09/24/15 (!) 344 lb (156 kg)  09/06/15 (!) 344 lb 6.4 oz (156.2 kg)  08/09/15 (!) 343 lb (155.6 kg)  08/04/15 (!) 341 lb (154.7 kg)  07/30/15 (!) 341 lb 4.8 oz (154.8 kg)  06/02/15 (!) 344 lb 3.2 oz (156.1 kg)  04/26/15 (!) 351 lb 6.4 oz (159.4 kg)  04/21/15 (!) 348 lb (157.9 kg)  03/29/15 (!) 350 lb 9.6 oz (159 kg)  02/22/15 (!) 352 lb 9.6 oz (159.9 kg)  02/05/15 (!) 355 lb 12.8 oz (161.4 kg)  01/20/15 (!) 355 lb 14.4 oz (161.4 kg)  01/18/15 (!) 347 lb 9.6 oz (157.7 kg)  12/16/14 (!) 360 lb 6.4 oz (163.5 kg)  12/14/14 (!) 359 lb 12.8 oz (163.2 kg)  11/19/14 (!) 367 lb (166.5 kg)  11/18/14 (!) 368 lb (166.9 kg)  11/05/14 (!) 366 lb (166 kg)  10/29/14 (!) 366 lb (166 kg)  10/27/14 (!) 366 lb (166 kg)  10/07/14 (!) 370 lb (167.8 kg)  09/26/14 (!) 358 lb 12.8 oz (162.8 kg)  09/23/14 (!) 366 lb (166 kg)  09/17/14 (!) 366 lb 4 oz (166.1 kg)  09/07/14 (!) 370 lb (167.8 kg)  09/04/14 (!) 370 lb 3.2 oz (167.9 kg)  08/12/14 (!) 359 lb (162.8 kg)  07/08/14 (!) 368 lb (166.9 kg)  06/25/14 (!) 371 lb (168.3 kg)  06/08/14 (!) 362 lb (164.2 kg)  06/04/14 (!) 361  lb (163.7 kg)  05/25/14 (!) 361 lb (163.7 kg)  04/21/14 (!) 356 lb 7.7 oz (161.7 kg)  03/09/14 (!) 375 lb (170.1 kg)  02/18/14 (!) 375 lb (170.1 kg)  02/17/14 (!) 374 lb 3.2 oz (169.7 kg)  12/31/13 (!) 357 lb (161.9 kg)  12/09/13 (!) 357 lb 12.8 oz (162.3 kg)  11/05/13 (!) 343 lb (155.6 kg)  10/20/13 (!) 356 lb (161.5 kg)  10/14/13 (!) 357 lb (161.9 kg)  10/14/13 (!) 357 lb (161.9 kg)  09/23/13 (!) 350 lb (158.8 kg)  07/29/13 (!) 356 lb 3.2 oz (161.6 kg)  06/04/13 (!) 354 lb 9.6 oz (160.8 kg)  06/02/13 (!) 350 lb (158.8 kg)  05/12/13 (!) 353 lb (160.1 kg)  03/19/13 (!) 345 lb (156.5 kg)  02/24/13 (!) 347 lb 6.4 oz (157.6 kg)  02/06/13 (!) 345 lb (156.5 kg)  01/19/13 (!) 343 lb (155.6 kg)  01/14/13 (!) 336 lb (152.4 kg)  01/08/13 (!) 343 lb 12.8 oz (155.9 kg)  12/23/12 (!) 346 lb (156.9 kg)  09/17/12 (!) 329 lb 6.4 oz (149.4 kg)  08/01/12 (!) 333 lb (151 kg)  07/31/12 (!) 333 lb 12.8 oz (151.4 kg)  07/31/12 (!) 319 lb (144.7 kg)  05/15/12 (!) 319 lb (144.7 kg)  01/29/12 (!) 335 lb 6.4 oz (152.1 kg)  12/15/11 (!) 328 lb (148.8 kg)  12/13/10 (!) 367 lb (166.5 kg)  10/12/10 (!) 383 lb (173.7  kg)  09/15/10 (!) 358 lb 1.9 oz (162.4 kg)  08/01/10 (!) 385 lb (174.6 kg)   Physical Exam  CURRENT PROBLEM LIST:  Patient Active Problem List   Diagnosis Date Noted   Severe episode of recurrent major depressive disorder, without psychotic features ([place]) 12/05/2021   AKI (acute kidney injury) ([place]) 11/22/2021   Acute on chronic diastolic (congestive) heart failure ([place]) 11/19/2021   Anxiety 11/10/2021   Breast cancer ([place]) 10/22/2021   Right elbow pain 04/27/2021   Chronic pain of right hand 04/27/2021   Iron deficiency anemia 09/13/2020   Bilateral primary osteoarthritis of knee 04/12/2020   Chronic radicular lumbar pain 03/18/2020   Lumbar spondylosis 03/18/2020   Spinal stenosis, lumbar region, with neurogenic claudication 03/18/2020   Moderate nonproliferative diabetic  retinopathy of both eyes without macular edema associated with type 2 diabetes mellitus ([place]) 08/27/2019   Type 2 diabetes mellitus with hyperlipidemia ([place]) 12/13/2017   GERD with esophagitis 11/15/2016   Dementia ([place]) 11/15/2016   Endometrial polyp 06/23/2016   Vitamin D deficiency 08/02/2015   Acute kidney injury superimposed on chronic kidney disease ([place]) 12/18/2014   Gout 10/27/2014   CN (constipation) 05/10/2014   COPD with chronic bronchitis ([place]) 04/21/2014   Estrogen deficiency 02/19/2014   Insomnia 09/17/2012   Long term current use of anticoagulant therapy 02/02/2012   Chronic diastolic congestive heart failure ([place]) 01/29/2012   Class 3 obesity ([place]) 02/15/2009   Microcytic anemia 02/15/2009   TENSION HEADACHE 01/07/2008   Hyperlipidemia 12/03/2006   Essential hypertension 12/03/2006   PAST MEDICAL HISTORY:  Active Ambulatory Problems    Diagnosis Date Noted   Hyperlipidemia 12/03/2006   Class 3 obesity ([place]) 02/15/2009   Microcytic anemia 02/15/2009   TENSION HEADACHE 01/07/2008   Essential hypertension 12/03/2006   Chronic diastolic congestive heart failure ([place]) 01/29/2012   Long term current use of anticoagulant therapy 02/02/2012   Insomnia 09/17/2012   Estrogen deficiency 02/19/2014   COPD with chronic bronchitis ([place]) 04/21/2014   CN (constipation) 05/10/2014   Gout 10/27/2014   Acute kidney injury superimposed on chronic kidney disease ([place]) 12/18/2014   Vitamin D deficiency 08/02/2015   Endometrial polyp 06/23/2016   GERD with esophagitis 11/15/2016   Dementia ([place]) 11/15/2016   Type 2 diabetes mellitus with hyperlipidemia ([place]) 12/13/2017   Moderate nonproliferative diabetic retinopathy of both eyes without macular edema associated with type 2 diabetes mellitus ([place]) 08/27/2019   Chronic radicular lumbar pain 03/18/2020   Lumbar spondylosis 03/18/2020   Spinal stenosis, lumbar region, with neurogenic claudication 03/18/2020   Bilateral primary  osteoarthritis of knee 04/12/2020   Iron deficiency anemia 09/13/2020   Right elbow pain 04/27/2021   Chronic pain of right hand 04/27/2021   Breast cancer ([place]) 10/22/2021   Anxiety 11/10/2021   Acute on chronic diastolic (congestive) heart failure ([place]) 11/19/2021   AKI (acute kidney injury) ([place]) 11/22/2021   Severe episode of recurrent major depressive disorder, without psychotic features ([place]) 12/05/2021   Resolved Ambulatory Problems    Diagnosis Date Noted   Fungal dermatitis 80/88/1103   DM w/o Complication Type II 15/94/5859   Situational depression 06/04/2009   RECTAL BLEEDING 01/18/2009   URTICARIA 05/14/2008   Polymyalgia rheumatica ([place]) 01/18/2009   Headache 01/07/2008   Orthopnea 03/09/2009   Personal history of other diseases of the digestive system 12/03/2006   Cholelithiasis 06/06/2010   UTI 06/06/2010   Chest pain, unspecified 09/15/2010   Shortness of breath 10/12/2010   Hypotension 01/26/2012   Abdominal pain  01/26/2012   Elevated troponin 01/26/2012   Chest pain 01/26/2012   History of deep venous thrombosis 01/27/2012   Pulmonary embolus ([place]) 01/28/2012   Dysphagia, unspecified(787.20) 07/31/2012   GERD (gastroesophageal reflux disease) 07/31/2012   Debility 08/01/2012   Osteoarthritis 08/01/2012   DM (diabetes mellitus), type 2, uncontrolled, with renal complications 09/62/8366   Itching 01/08/2013   Need for prophylactic vaccination and inoculation against influenza 01/08/2013   Dysuria 12/31/2013   Pain in the chest 12/31/2013   Breast cancer screening 02/19/2014   Weight gain 02/19/2014   Sepsis secondary to UTI ([place]) 04/21/2014   Fever 04/21/2014   Sepsis ([place]) 04/21/2014   Acute pyelonephritis 04/22/2014   History of pulmonary embolus (PE) 04/22/2014   DOE (dyspnea on exertion) 06/24/2014   Abnormal stress test 09/27/2014   Preoperative clearance 08/16/2014   Chronic bronchitis ([place]) 09/23/2014   Vertigo 09/24/2014   Encounter for  therapeutic drug monitoring 10/08/2014   Breast mass 10/27/2014   Morbid obesity with BMI of 50.0-59.9, adult ([place]) 12/18/2014   Chronic pain syndrome 02/06/2015   Cough variant asthma 11/17/2015   Vaginitis and vulvovaginitis 06/23/2016   Esophageal dysphagia 11/07/2016   Multiple benign nevi 11/15/2016   Anxiety associated with depression 06/08/2017   Hyperkalemia 07/26/2017   Acute on chronic diastolic CHF (congestive heart failure) ([place]) 10/08/2017   CHF (congestive heart failure) ([place]) 10/08/2017   Postmenopausal bleeding 06/04/2014   Pneumonia due to COVID-19 virus 01/25/2019   Acute respiratory failure with hypoxia ([place]) 01/29/2019   Pulmonary embolus ([place]) 07/15/2019   Follow-up examination after eye surgery 08/27/2019   Right epiretinal membrane 08/27/2019   Vitreomacular adhesion of right eye 08/27/2019   Diabetes mellitus without complication ([place]) 29/47/6546   Intermediate stage nonexudative age-related macular degeneration of both eyes 08/27/2019   Chest pain on exertion 02/14/2020   Non-STEMI (non-ST elevated myocardial infarction) ([place]) 02/15/2020   Presence of stent in right coronary artery    Pain management contract signed 04/12/2020   Syncope, vasovagal 07/04/2021   Syncope 07/04/2021   Acute on chronic heart failure with preserved ejection fraction (HFpEF) ([place]) 10/13/2021   Class 3 obesity ([place]) 10/22/2021   Past Medical History:  Diagnosis Date   Allergy    Anemia, unspecified    Arthritis    Chronic diastolic CHF (congestive heart failure) (HCC)    CKD (chronic kidney disease), stage III ([place])    Coronary artery disease    Depression    Diabetes mellitus    Gout, unspecified    History of blood clots    Hypertension    Joint pain    Joint swelling    Morbid obesity ([place])    Myocardial infarction ([place]) 2004   Numbness    Obstructive sleep apnea    Osteoarthrosis, unspecified whether generalized or localized, lower leg    Pain, chronic     Pulmonary emboli ([place]) 01/2012   Urinary incontinence    Wears dentures    SOCIAL HX:  Social History   Tobacco Use   Smoking status: Never    Passive exposure: Past   Smokeless tobacco: Never   Tobacco comments:    Both parents smoked, patient was exposed/ "Raised up in smoke, my whole life."  Substance Use Topics   Alcohol use: No    Alcohol/week: 0.0 standard drinks of alcohol   FAMILY HX:  Family History  Problem Relation Age of Onset   Breast cancer Mother 29   Heart disease Mother    Throat  cancer Father    Hypertension Father    Arthritis Father    Diabetes Father    Arthritis Sister    Obesity Sister    Diabetes Sister    Heart disease Cousin    Colon cancer Neg Hx    Stomach cancer Neg Hx    Esophageal cancer Neg Hx       ALLERGIES:  Allergies  Allergen Reactions   Sulfonamide Derivatives Swelling and Other (See Comments)    Mouth swelling- no resp issues noted   Lokelma [Sodium Zirconium Cyclosilicate] Other (See Comments)    Headache, stomach upset   Aricept [Donepezil Hcl] Diarrhea and Nausea And Vomiting    GI upset/loose stools   Tramadol Nausea And Vomiting      PERTINENT MEDICATIONS:  Outpatient Encounter Medications as of 12/12/2021  Medication Sig   albuterol (PROAIR HFA) 108 (90 Base) MCG/ACT inhaler Inhale 1-2 puffs into the lungs every 6 (six) hours as needed for wheezing or shortness of breath.   anastrozole (ARIMIDEX) 1 MG tablet TAKE 1 TABLET BY MOUTH EVERY DAY (Patient taking differently: Take 1 mg by mouth daily.)   ASPIRIN LOW DOSE 81 MG EC tablet TAKE 1 TABLET BY MOUTH EVERY DAY (Patient taking differently: Take 81 mg by mouth daily.)   atorvastatin (LIPITOR) 80 MG tablet TAKE 1 TABLET BY MOUTH EVERY DAY (Patient taking differently: Take 80 mg by mouth daily.)   budesonide-formoterol (SYMBICORT) 160-4.5 MCG/ACT inhaler Inhale 2 puffs into the lungs 2 (two) times daily. (Patient taking differently: Inhale 2 puffs into the lungs 2 (two)  times daily as needed (shortness of breath/wheezing).)   carvedilol (COREG) 12.5 MG tablet TAKE 1 TABLET BY MOUTH 2 TIMES DAILY (Patient taking differently: Take 12.5 mg by mouth 2 (two) times daily with a meal.)   cetirizine (ZYRTEC) 10 MG tablet Take 10 mg by mouth daily as needed for allergies or rhinitis.   clopidogrel (PLAVIX) 75 MG tablet Take 1 tablet (75 mg total) by mouth daily.   Continuous Blood Gluc Sensor (FREESTYLE LIBRE 14 DAY SENSOR) MISC PLACE 1 DEVICE ON THE SKIN AS DIRECTED EVERY 14 DAYS   diclofenac Sodium (VOLTAREN) 1 % GEL Apply 2 g topically 4 (four) times daily. (Patient taking differently: Apply 1 Application topically 4 (four) times daily as needed (joint pain).)   escitalopram (LEXAPRO) 20 MG tablet Take 1 tablet (20 mg total) by mouth daily. Take 1/2 tablet in 2 weeks, then go to whole tablet   febuxostat (ULORIC) 40 MG tablet TAKE 1 TABLET BY MOUTH EVERY DAY (Patient taking differently: Take 40 mg by mouth daily.)   fluticasone (FLONASE) 50 MCG/ACT nasal spray Place 1 spray into both nostrils in the morning and at bedtime. (Patient taking differently: Place 1 spray into both nostrils 2 (two) times daily as needed for allergies or rhinitis.)   folic acid (FOLVITE) 1 MG tablet TAKE 1 TABLET BY MOUTH EVERY DAY (Patient taking differently: Take 1 mg by mouth daily.)   guaiFENesin (MUCINEX) 600 MG 12 hr tablet Take 1 tablet (600 mg total) by mouth 2 (two) times daily as needed.   guaifenesin (ROBITUSSIN) 100 MG/5ML syrup Take 200 mg by mouth 3 (three) times daily as needed for cough.   hydrOXYzine (ATARAX) 25 MG tablet Take 1-2 tablets (25-50 mg total) by mouth every 8 (eight) hours as needed for anxiety or itching.   insulin aspart (NOVOLOG FLEXPEN) 100 UNIT/ML FlexPen Inject 14 Units into the skin 3 (three) times daily before meals.  insulin glargine, 2 Unit Dial, (TOUJEO MAX SOLOSTAR) 300 UNIT/ML Solostar Pen Inject 42 Units into the skin daily. Patient assistance program  provides (Patient taking differently: Inject 42 Units into the skin every morning. Patient assistance program provides)   ipratropium-albuterol (DUONEB) 0.5-2.5 (3) MG/3ML SOLN Take 3 mLs by nebulization every 6 (six) hours as needed.   linaclotide (LINZESS) 145 MCG CAPS capsule Take 1 capsule (145 mcg total) by mouth daily before breakfast. (Patient taking differently: Take 145 mcg by mouth daily as needed (constipation).)   meclizine (ANTIVERT) 25 MG tablet TAKE 1 TABLET BY MOUTH THREE TIMES DAILY AS NEEDED FOR DIZZINESS (Patient taking differently: Take 25 mg by mouth 2 (two) times daily as needed for dizziness.)   memantine (NAMENDA) 5 MG tablet TAKE 2 TABLETS BY MOUTH EVERY DAY and TAKE 1 TABLET BY MOUTH EVERY EVENING (Patient taking differently: Take 5-10 mg by mouth See admin instructions. Take 2 tablets (10 mg) by mouth every morning and 1 tablet (5 mg) every evening)   Nebulizer System All-In-One MISC 1 kit by Does not apply route as needed.   nitroGLYCERIN (NITROSTAT) 0.4 MG SL tablet Take one tablet under the tongue every 5 minutes as needed for chest pain (Patient taking differently: Place 0.4 mg under the tongue every 5 (five) minutes as needed for chest pain.)   nystatin cream (MYCOSTATIN) Apply 1 application topically 2 (two) times daily. (Patient taking differently: Apply 1 application  topically 2 (two) times daily as needed for dry skin.)   OVER THE COUNTER MEDICATION Take 1 capsule by mouth every other day. Over the counter stool softener - unknown type   pantoprazole (PROTONIX) 20 MG tablet Take 1 tablet (20 mg total) by mouth daily.   polyethylene glycol powder (MIRALAX) 17 GM/SCOOP powder 17 gm mix with liquid twice daily as needed   Respiratory Therapy Supplies (NEBULIZER/TUBING/MOUTHPIECE) KIT 1 Units by Does not apply route as needed (for breathing treatments).   sodium polystyrene (SPS) 15 GM/60ML suspension take 36ms by MOUTH ONCE WEEKLY. TO keep potassium down   torsemide  (DEMADEX) 20 MG tablet Take 1 tablet (20 mg total) by mouth daily. Take an extra dose for weight gain, 3lbs in 1 day or 5lbs in 1 week   Vitamin D, Ergocalciferol, (DRISDOL) 1.25 MG (50000 UNIT) CAPS capsule TAKE 1 CAPSULE BY MOUTH every 7 days (Patient taking differently: Take 50,000 Units by mouth every Monday.)   No facility-administered encounter medications on file as of 12/12/2021.    Thank you for the opportunity to participate in the care of Ms. Tsosie.  The palliative care team will continue to follow. Please call our office at [phone] we can be of additional assistance.   THollace Kinnier DO  COVID-19 PATIENT SCREENING TOOL Asked and negative response unless otherwise noted:  Have you had symptoms of covid, tested positive or been in contact with someone with symptoms/positive test in the past 5-10 days?  NO

## 2021-12-12 NOTE — Telephone Encounter (Signed)
Erica from [facility] called and stated that pt has pain in left leg -10/10 and pt has pain in left shoulder -10/10

## 2021-12-13 ENCOUNTER — Other Ambulatory Visit: Payer: Self-pay | Admitting: Hematology

## 2021-12-13 ENCOUNTER — Other Ambulatory Visit: Payer: Self-pay | Admitting: Nurse Practitioner

## 2021-12-13 DIAGNOSIS — K219 Gastro-esophageal reflux disease without esophagitis: Secondary | ICD-10-CM

## 2021-12-13 NOTE — Telephone Encounter (Signed)
Left patient a detailed voice message to return call to office to schedule a visit here if she is feeling worse.

## 2021-12-14 ENCOUNTER — Ambulatory Visit: Payer: PPO | Admitting: Cardiovascular Disease

## 2021-12-15 ENCOUNTER — Encounter (HOSPITAL_COMMUNITY): Payer: Self-pay

## 2021-12-15 ENCOUNTER — Other Ambulatory Visit: Payer: Self-pay | Admitting: Nurse Practitioner

## 2021-12-15 ENCOUNTER — Encounter: Payer: Self-pay | Admitting: Hematology

## 2021-12-15 DIAGNOSIS — F419 Anxiety disorder, unspecified: Secondary | ICD-10-CM

## 2021-12-16 DIAGNOSIS — R600 Localized edema: Secondary | ICD-10-CM | POA: Diagnosis not present

## 2021-12-16 DIAGNOSIS — M79661 Pain in right lower leg: Secondary | ICD-10-CM | POA: Diagnosis not present

## 2021-12-16 DIAGNOSIS — M79662 Pain in left lower leg: Secondary | ICD-10-CM | POA: Diagnosis not present

## 2021-12-16 NOTE — Progress Notes (Signed)
This encounter was created in error - please disregard.

## 2021-12-20 ENCOUNTER — Encounter (HOSPITAL_COMMUNITY): Payer: Self-pay

## 2021-12-20 ENCOUNTER — Telehealth: Payer: Self-pay | Admitting: Family Medicine

## 2021-12-20 ENCOUNTER — Encounter: Payer: Self-pay | Admitting: Hematology

## 2021-12-20 NOTE — Progress Notes (Signed)
Cardiology Office Note:    Date:  12/30/2021   ID:  [name], DOB August 08, 1934, MRN [id number]  PCP:  Bonnita Hollow, MD   [facility] Providers Cardiologist:  Ena Dawley, MD {   Referring MD: [name], NP    History of Present Illness:    [name] is a 87 y.o. female with a hx of dementia, CAD (BMS to RCA 2004, DESx2 to mid and proximal RCA 2012, recent NSTEMI 02/2020 s/p DES to RCA), chronic diastolic CHF, DM, morbid obesity, remote gastric bypass, PMR, HLD, depression, OCD, anxiety, COPD, prior PE/DVT, CKD stage III, breast CA who presents to clinic for follow-up.  Per review of the record, the patient has history of multiple medical problems as outlined above. She was admitted 02/2020 with chest pain/NSTEMI and underwent cath showing multivessel CAD but severe ISR in pRCA and m/dRCA lesion both treated with DES. There was diffuse mild to moderate disease in the LM as well as Lcx and LAD but nonobstructive and treated medically. LVEF was normal, borderline elevated LVEDP. 2D echo was technically difficult given habitus. Hospital course also notable for anemia requiring blood transfusion (has prior h/o anemia). IM discussed case with Dr. Irene Limbo, her oncologist, regarding ongoing maintenance Xarelto for prior h/o PE/DVT and the mutual decision was made to hold further Xarelto given need for DAPT and history of anemia.   Was seen in 02/2020 with [name] where she was tachycardic with elevated D-dimer. V/Q scan obtained without evidence of PE.  Was admitted 10/13/21 with acute on chronic HFpEF. Diuresed with IV lasix and later transitioned to lasix 40 mg po daily. Discharge weight 315 pounds. Discharged 10/22/21.   Was seen in clinic by [name], NP on 10/25/21 where she was wheelchair bound and required assistance with all ADLs. She was continued on lasix '40mg'$  daily with extra dose as needed.  Today, the patient overall feels okay. Fluid is better controlled and  she is currently taking an additional dose of the torsemide about every other day. Continues to have shortness of breath with activity but not at rest. Sleeps propped up on 2 pillows which is chronic. Occasional dizziness with position changes. Appetite is good. Trace LE edema. Mainly wheelchair bound at this time. Needs assistance with all ADLs.  Past Medical History:  Diagnosis Date   Abdominal pain 01/26/2012   Acute kidney injury superimposed on chronic kidney disease ([place]) 12/18/2014   Allergy    takes Mucinex daily as needed   Anemia, unspecified    Anxiety    takes Clonazepam daily as needed   Anxiety associated with depression 06/08/2017   Arthritis    Breast cancer ([place])    Breast cancer screening 02/19/2014   Breast mass 10/27/2014   Chronic diastolic CHF (congestive heart failure) ([place])    takes Furosemide daily   Chronic pain syndrome 02/06/2015   CKD (chronic kidney disease), stage III ([place])    Coronary artery disease    a. s/p IMI 2004 tx with BMS to RCA;  b. s/p Promus DES to RCA 2/12 c. abnormal nuc 2016 -> cath with med rx. d. NSTEMI 02/2020  mv CAD with severe ISR in pRCA and m/dRCA lesion both treated with DES.   Debility 08/01/2012   Dementia ([place])    Depression    takes Cymbalta daily   Diabetes mellitus    insulin daily   DOE (dyspnea on exertion) 06/24/2014   Dysphagia, unspecified(787.20) 07/31/2012   Fungal dermatitis 11/13/2007  Qualifier: Diagnosis of  By: Burnice Logan  MD, Doretha Sou    GERD (gastroesophageal reflux disease)    takes Protonix daily   Gout, unspecified    Headache    occasionally   History of blood clots    History of deep venous thrombosis 01/27/2012   History of pulmonary embolus (PE) 04/22/2014   Hyperkalemia 07/26/2017   Hyperlipidemia    takes Pravastatin daily   Hypertension    Insomnia    Joint pain    Joint swelling    Morbid obesity ([place])    Multiple benign nevi 11/15/2016   Myocardial infarction [facility]) 2004   Non-STEMI (non-ST  elevated myocardial infarction) ([place]) 02/15/2020   Numbness    Obstructive sleep apnea    does not wear cpap   Osteoarthritis    Osteoarthritis    Osteoarthrosis, unspecified whether generalized or localized, lower leg    Pain in the chest 12/31/2013   Pain, chronic    Personal history of other diseases of the digestive system 12/03/2006   Qualifier: Diagnosis of  By: Burnice Logan  MD, Doretha Sou    Polymyalgia rheumatica [facility])    Presence of stent in right coronary artery    Pulmonary emboli ([place]) 01/2012   felt to need lifelong anticoagulation but Xarelto dc in 02/2020 due to need for DAPT   Situational depression 06/04/2009   Qualifier: Diagnosis of  By: Burnice Logan  MD, Doretha Sou    Syncope, vasovagal 07/04/2021   Urinary incontinence    takes Linzess daily   Vaginitis and vulvovaginitis 06/23/2016   Vertigo    hx of;was taking Meclizine if needed   Wears dentures     Past Surgical History:  Procedure Laterality Date   ABDOMINAL HYSTERECTOMY     partial   APPENDECTOMY     blood clots/legs and lungs  2013   BREAST BIOPSY Left 07/22/2014   BREAST BIOPSY Left 02/10/2013   BREAST LUMPECTOMY Left 11/05/2014   BREAST LUMPECTOMY WITH RADIOACTIVE SEED LOCALIZATION Left 11/05/2014   Procedure: LEFT BREAST LUMPECTOMY WITH RADIOACTIVE SEED LOCALIZATION;  Surgeon: [name], MD;  Location: [place];  Service: General;  Laterality: Left;   CARDIAC CATHETERIZATION     COLONOSCOPY     CORONARY ANGIOPLASTY  2   CORONARY STENT INTERVENTION N/A 02/17/2020   Procedure: CORONARY STENT INTERVENTION;  Surgeon: Leonie Man, MD;  Location: [place] CV [facility];  Service: Cardiovascular;  Laterality: N/A;   ESOPHAGOGASTRODUODENOSCOPY (EGD) WITH PROPOFOL N/A 11/07/2016   Procedure: ESOPHAGOGASTRODUODENOSCOPY (EGD) WITH PROPOFOL;  Surgeon: Gatha Mayer, MD;  Location: [facility];  Service: Endoscopy;  Laterality: N/A;   EXCISION OF SKIN TAG Right 11/05/2014   Procedure: EXCISION OF RIGHT EYELID  SKIN TAG;  Surgeon: [name], MD;  Location: [place];  Service: General;  Laterality: Right;   EYE SURGERY Bilateral    cataract    GASTRIC BYPASS  1977    reversed in 1979, [place] CATH AND CORONARY ANGIOGRAPHY N/A 02/17/2020   Procedure: LEFT HEART CATH AND CORONARY ANGIOGRAPHY;  Surgeon: Leonie Man, MD;  Location: [place] [facility];  Service: Cardiovascular;  Laterality: N/A;   LEFT HEART CATHETERIZATION WITH CORONARY ANGIOGRAM N/A 06/29/2014   Procedure: LEFT HEART CATHETERIZATION WITH CORONARY ANGIOGRAM;  Surgeon: Troy Sine, MD;  Location: [facility] C[facility];  Service: Cardiovascular;  Laterality: N/A;   MEMBRANE PEEL Right 10/23/2018   Procedure: MEMBRANE PEEL;  Surgeon: Hurman Horn, MD;  Location: [place];  Service: Ophthalmology;  Laterality: Right;   MI with stent placement  2004   PARS PLANA VITRECTOMY Right 10/23/2018   Procedure: PARS PLANA VITRECTOMY WITH 25 GAUGE;  Surgeon: Hurman Horn, MD;  Location: [place];  Service: Ophthalmology;  Laterality: Right;    Current Medications: Current Meds  Medication Sig   albuterol (PROAIR HFA) 108 (90 Base) MCG/ACT inhaler Inhale 1-2 puffs into the lungs every 6 (six) hours as needed for wheezing or shortness of breath.   anastrozole (ARIMIDEX) 1 MG tablet TAKE 1 TABLET BY MOUTH EVERY DAY (Patient taking differently: Take 1 mg by mouth daily.)   ASPIRIN LOW DOSE 81 MG EC tablet TAKE 1 TABLET BY MOUTH EVERY DAY (Patient taking differently: Take 81 mg by mouth daily.)   atorvastatin (LIPITOR) 80 MG tablet TAKE 1 TABLET BY MOUTH EVERY DAY (Patient taking differently: Take 80 mg by mouth daily.)   budesonide-formoterol (SYMBICORT) 160-4.5 MCG/ACT inhaler Inhale 2 puffs into the lungs 2 (two) times daily. (Patient taking differently: Inhale 2 puffs into the lungs 2 (two) times daily as needed (shortness of breath/wheezing).)   carvedilol (COREG) 12.5 MG tablet TAKE 1 TABLET BY MOUTH 2 TIMES DAILY (Patient taking  differently: Take 12.5 mg by mouth 2 (two) times daily with a meal.)   cetirizine (ZYRTEC) 10 MG tablet Take 10 mg by mouth daily as needed for allergies or rhinitis.   clopidogrel (PLAVIX) 75 MG tablet Take 1 tablet (75 mg total) by mouth daily.   Continuous Blood Gluc Sensor (FREESTYLE LIBRE 14 DAY SENSOR) MISC PLACE 1 DEVICE ON THE SKIN AS DIRECTED EVERY 14 DAYS   diclofenac Sodium (VOLTAREN) 1 % GEL Apply 2 g topically 4 (four) times daily. (Patient taking differently: Apply 1 Application topically 4 (four) times daily as needed (joint pain).)   escitalopram (LEXAPRO) 20 MG tablet Take 1 tablet (20 mg total) by mouth daily. Take 1/2 tablet in 2 weeks, then go to whole tablet   escitalopram (LEXAPRO) 20 MG tablet Take 1 tablet (20 mg total) by mouth daily.   febuxostat (ULORIC) 40 MG tablet TAKE 1 TABLET BY MOUTH EVERY DAY (Patient taking differently: Take 40 mg by mouth daily.)   fluticasone (FLONASE) 50 MCG/ACT nasal spray Place 1 spray into both nostrils in the morning and at bedtime. (Patient taking differently: Place 1 spray into both nostrils 2 (two) times daily as needed for allergies or rhinitis.)   folic acid (FOLVITE) 1 MG tablet TAKE 1 TABLET BY MOUTH EVERY DAY (Patient taking differently: Take 1 mg by mouth daily.)   guaifenesin (ROBITUSSIN) 100 MG/5ML syrup Take 200 mg by mouth 3 (three) times daily as needed for cough.   hydrOXYzine (ATARAX) 25 MG tablet Take 1-2 tablets (25-50 mg total) by mouth every 8 (eight) hours as needed for anxiety or itching.   insulin aspart (NOVOLOG FLEXPEN) 100 UNIT/ML FlexPen Inject 14 Units into the skin 3 (three) times daily before meals.   insulin glargine, 2 Unit Dial, (TOUJEO MAX SOLOSTAR) 300 UNIT/ML Solostar Pen Inject 42 Units into the skin daily. Patient assistance program provides (Patient taking differently: Inject 42 Units into the skin every morning. Patient assistance program provides)   linaclotide (LINZESS) 145 MCG CAPS capsule Take 1  capsule (145 mcg total) by mouth daily before breakfast. (Patient taking differently: Take 145 mcg by mouth daily as needed (constipation).)   meclizine (ANTIVERT) 25 MG tablet TAKE 1 TABLET BY MOUTH THREE TIMES DAILY AS NEEDED FOR DIZZINESS (Patient taking differently: Take 25  mg by mouth 2 (two) times daily as needed for dizziness.)   memantine (NAMENDA) 5 MG tablet TAKE 2 TABLETS BY MOUTH EVERY DAY and TAKE 1 TABLET BY MOUTH EVERY EVENING (Patient taking differently: Take 5-10 mg by mouth See admin instructions. Take 2 tablets (10 mg) by mouth every morning and 1 tablet (5 mg) every evening)   nitroGLYCERIN (NITROSTAT) 0.4 MG SL tablet Take one tablet under the tongue every 5 minutes as needed for chest pain (Patient taking differently: Place 0.4 mg under the tongue every 5 (five) minutes as needed for chest pain.)   nystatin cream (MYCOSTATIN) Apply 1 application topically 2 (two) times daily. (Patient taking differently: Apply 1 application  topically 2 (two) times daily as needed for dry skin.)   OVER THE COUNTER MEDICATION Take 1 capsule by mouth every other day. Over the counter stool softener - unknown type   oxyCODONE-acetaminophen (PERCOCET) 7.5-325 MG tablet Take 1 tablet by mouth every 12 (twelve) hours as needed for moderate pain or severe pain. Must last 30 days.   [START ON 01/27/2022] oxyCODONE-acetaminophen (PERCOCET) 7.5-325 MG tablet Take 1 tablet by mouth every 12 (twelve) hours as needed for moderate pain or severe pain. Must last 30 days.   [START ON 02/26/2022] oxyCODONE-acetaminophen (PERCOCET) 7.5-325 MG tablet Take 1 tablet by mouth every 12 (twelve) hours as needed for moderate pain or severe pain. Must last 30 days.   pantoprazole (PROTONIX) 20 MG tablet TAKE 1 TABLET BY MOUTH EVERY DAY   sodium polystyrene (SPS) 15 GM/60ML suspension take 21ms by MOUTH ONCE WEEKLY. TO keep potassium down   torsemide (DEMADEX) 20 MG tablet TAKE 1 TABLET BY MOUTH EVERY DAY - take an extra dose  for weight gain, 3lb in 1 day or 5lbs in 1 week   Vitamin D, Ergocalciferol, (DRISDOL) 1.25 MG (50000 UNIT) CAPS capsule TAKE 1 CAPSULE BY MOUTH every 7 days     Allergies:   Sulfonamide derivatives, Lokelma [sodium zirconium cyclosilicate], Aricept [donepezil hcl], and Tramadol   Social History   Socioeconomic History   Marital status: Widowed    Spouse name: Not on file   Number of children: 3   Years of education: Not on file   Highest education level: High school graduate  Occupational History   Occupation: retired  Tobacco Use   Smoking status: Never    Passive exposure: Past   Smokeless tobacco: Never   Tobacco comments:    Both parents smoked, patient was exposed/ "Raised up in smoke, my whole life."  Vaping Use   Vaping Use: Never used  Substance and Sexual Activity   Alcohol use: No    Alcohol/week: 0.0 standard drinks of alcohol   Drug use: No   Sexual activity: Not Currently  Other Topics Concern   Not on file  Social History Narrative   Not on file   Social Determinants of Health   Financial Resource Strain: Low Risk  (10/14/2021)   Overall Financial Resource Strain (CARDIA)    Difficulty of Paying Living Expenses: Not very hard  Food Insecurity: No Food Insecurity (10/14/2021)   Hunger Vital Sign    Worried About Running Out of Food in the Last Year: Never true    Ran Out of Food in the Last Year: Never true  Transportation Needs: No Transportation Needs (10/14/2021)   PRAPARE - THydrologist(Medical): No    Lack of Transportation (Non-Medical): No  Physical Activity: Inactive (07/26/2017)   Exercise Vital  Sign    Days of Exercise per Week: 0 days    Minutes of Exercise per Session: 0 min  Stress: Stress Concern Present (07/26/2017)   [place]    Feeling of Stress : Rather much  Social Connections: Moderately Integrated (07/26/2017)   Social Connection and  Isolation Panel [NHANES]    Frequency of Communication with Friends and Family: Once a week    Frequency of Social Gatherings with Friends and Family: Three times a week    Attends Religious Services: 1 to 4 times per year    Active Member of Clubs or Organizations: Yes    Attends Archivist Meetings: 1 to 4 times per year    Marital Status: Widowed     Family History: [name] family history includes Arthritis in her father and sister; Breast cancer (age of onset: 58) in her mother; Diabetes in her father and sister; Heart disease in her cousin and mother; Hypertension in her father; Obesity in her sister; Throat cancer in her father. There is no history of Colon cancer, Stomach cancer, or Esophageal cancer.  ROS:   Please see the history of present illness.    Review of Systems  Constitutional:  Negative for fever.  HENT:  Negative for sore throat.   Eyes:  Negative for blurred vision.  Respiratory:  Positive for shortness of breath. Negative for cough.   Cardiovascular:  Negative for chest pain, palpitations, orthopnea, claudication, leg swelling (bilateral) and PND.  Gastrointestinal:  Negative for abdominal pain, blood in stool, diarrhea and melena.  Genitourinary:  Negative for hematuria.  Musculoskeletal:  Negative for falls.  Skin:  Negative for itching.  Neurological:  Positive for headaches. Negative for dizziness and loss of consciousness.  Endo/Heme/Allergies:  Negative for environmental allergies. Does not bruise/bleed easily.  Psychiatric/Behavioral:  Negative for depression.      EKGs/Labs/Other Studies Reviewed:    The following studies were reviewed today: Echo 07/05/21 1. Left ventricular ejection fraction, by estimation, is 55 to 60%. The  left ventricle has normal function. Left ventricular endocardial border  not optimally defined to evaluate regional wall motion- there are no wall  motion abnormalities seen in views  obtained. Left ventricular  diastolic parameters are consistent with Grade  I diastolic dysfunction (impaired relaxation).   2. Right ventricular systolic function is normal. The right ventricular  size is normal.   3. The mitral valve was not well visualized. No evidence of mitral valve  regurgitation. No evidence of mitral stenosis.   4. The aortic valve was not well visualized. Aortic valve regurgitation  is not visualized.   Comparison(s): Difficult comparison- this is a technically difficult  study.   L Heart Cath 01/60/10 LV end diastolic pressure is mildly elevated. ------------------------------ Mid LM to Prox LAD lesion is 30% stenosed with 30% stenosed side branch in Ost Cx. Prox LAD to Mid LAD lesion is 45% stenosed with 25% stenosed side branch in 1st Diag. 1st Mrg lesion is 100% stenosed. ---------------- Colon Flattery RCA to Prox RCA stent is 10% stenosed. Prox RCA to Mid RCA stent is 5% stenosed. Prox RCA lesion is 95% stenosed, between the 2 old stents A drug-eluting stent was successfully placed using a STENT RESOLUTE ONYX 4.0X18 -> postdilated to 4.6-4.7 mm Post intervention, there is a 0% residual stenosis. ---------------- Mid RCA lesion is 85% stenosed after the second stent A drug-eluting stent was successfully placed using a STENT RESOLUTE ONYX 3.0X18 -> tapered  post dilation from 4.2-3.3 mm Post intervention, there is a 0% residual stenosis.   SUMMARY Multivessel CAD with severe (95%) IntraStent stenosis in the proximal RCA and de novo stenosis distal to mid RCA stent (85%) -> both treated with DES PCI using Resolute Onyx DES stents (distal-3.0 mm x 18 mm with tapered post dilation 4.2-3.3 mm; proximal 4.0 mm 18 mm postdilated to 4.7 mm.) Diffuse mild to moderate calcified disease in the distal Left Main as well as ostial and proximal LCx and LAD with no severe stenoses. Borderline elevated LVEDP with severe systemic hypertension     RECOMMENDATIONS Return to nursing unit for ongoing care.   Okay to DC heparin. We will need to determine plus or minus the use of Xarelto plus Plavix.  For now would continue aspirin plus Plavix until discharge and then discontinue aspirin on discharge with Xarelto plus Plavix for 3-6 months. Continue to monitor anemia and other risk factors.   2D Echo 02/15/20  1. Technically difficult echo with poor image quality.   2. Left ventricular ejection fraction, by estimation, is 65 to 70%. The  left ventricle has normal function. The left ventricle has no regional  wall motion abnormalities. Left ventricular diastolic parameters are  consistent with Grade I diastolic  dysfunction (impaired relaxation).   3. Right ventricular systolic function was not well visualized. The right  ventricular size is not well visualized.   4. The mitral valve was not well visualized. No evidence of mitral valve  regurgitation.   5. The aortic valve was not well visualized. Aortic valve regurgitation  is not visualized.    Cath 65/99/35 LV end diastolic pressure is mildly elevated. ------------------------------ Mid LM to Prox LAD lesion is 30% stenosed with 30% stenosed side branch in Ost Cx. Prox LAD to Mid LAD lesion is 45% stenosed with 25% stenosed side branch in 1st Diag. 1st Mrg lesion is 100% stenosed. ---------------- Colon Flattery RCA to Prox RCA stent is 10% stenosed. Prox RCA to Mid RCA stent is 5% stenosed. Prox RCA lesion is 95% stenosed, between the 2 old stents A drug-eluting stent was successfully placed using a STENT RESOLUTE ONYX 4.0X18 -> postdilated to 4.6-4.7 mm Post intervention, there is a 0% residual stenosis. ---------------- Mid RCA lesion is 85% stenosed after the second stent A drug-eluting stent was successfully placed using a STENT RESOLUTE ONYX 3.0X18 -> tapered post dilation from 4.2-3.3 mm Post intervention, there is a 0% residual stenosis.   SUMMARY Multivessel CAD with severe (95%) IntraStent stenosis in the proximal RCA and de novo  stenosis distal to mid RCA stent (85%) -> both treated with DES PCI using Resolute Onyx DES stents (distal-3.0 mm x 18 mm with tapered post dilation 4.2-3.3 mm; proximal 4.0 mm 18 mm postdilated to 4.7 mm.) Diffuse mild to moderate calcified disease in the distal Left Main as well as ostial and proximal LCx and LAD with no severe stenoses. Borderline elevated LVEDP with severe systemic hypertension     RECOMMENDATIONS Return to nursing unit for ongoing care.  Okay to DC heparin. We will need to determine plus or minus the use of Xarelto plus Plavix.  For now would continue aspirin plus Plavix until discharge and then discontinue aspirin on discharge with Xarelto plus Plavix for 3-6 months. Continue to monitor anemia and other risk factors.  EKG:  EKG was not ordered today 05/18/21: NSR, inferior q waves, HR75  Recent Labs: 07/05/2021: TSH 1.194 11/19/2021: B Natriuretic Peptide 43.5 11/22/2021: ALT 11 11/23/2021: Magnesium  1.3 11/26/2021: Hemoglobin 8.8; Platelets 209 12/05/2021: BUN 57; Creatinine, Ser 1.90; Potassium 5.6; Sodium 136  Recent Lipid Panel    Component Value Date/Time   CHOL 97 01/26/2021 1405   CHOL 136 04/20/2015 1001   TRIG 109 01/26/2021 1405   HDL 36 (L) 01/26/2021 1405   HDL 40 04/20/2015 1001   CHOLHDL 2.7 01/26/2021 1405   VLDL 17 02/15/2020 0636   LDLCALC 42 01/26/2021 1405     Risk Assessment/Calculations:           Physical Exam:    VS:  BP 139/62   Pulse 60   Ht '5\' 7"'$  (1.702 m)   Wt (!) 309 lb 9.6 oz (140.4 kg)   SpO2 96%   BMI 48.49 kg/m     Wt Readings from Last 3 Encounters:  12/30/21 (!) 309 lb 9.6 oz (140.4 kg)  12/27/21 (!) 304 lb (137.9 kg)  11/26/21 (!) 304 lb 14.3 oz (138.3 kg)     GEN:  Comfortable, wheel chair bound HEENT: Normal NECK: No JVD; No carotid bruits CARDIAC: RRR, no murmurs, rubs, gallops RESPIRATORY:  Clear to auscultation without rales, wheezing or rhonchi  ABDOMEN: Soft, non-tender,  non-distended MUSCULOSKELETAL:  Trace ankle edema, warm  SKIN: Warm and dry NEUROLOGIC:  Alert and oriented x 3 PSYCHIATRIC:  Normal affect   ASSESSMENT:    1. Chronic diastolic congestive heart failure ([place])   2. Coronary artery disease of native artery of native heart with stable angina pectoris ([place])   3. Primary hypertension   4. Essential hypertension   5. Diabetes mellitus with coincident hypertension ([place])   6. History of pulmonary embolus ([place])      PLAN:    In order of problems listed above:  #CAD with Prior NSTEMI 02/2020 s/p PCI to RCA: Had BMS to RCA 2004, DESx2 to mid and proximal RCA 2012, recent NSTEMI 02/2020 with ISR in [place] and mRCA s/p DES. Doing okay with no anginal symptoms although she is mainly sedentary. -Continue ASA '81mg'$  daily -Continue plavix '75mg'$  daily -Continue lipitor '80mg'$  daily -Continue coreg 12.'5mg'$  BID -Nitro prn  #Chronic Diastolic HF: Currently appears euvolemic. Minimally mobile at this time and is mainly wheelchair bound.  -Continue torsemide '20mg'$  daily with additional dose as needed -Continue coreg 12.'5mg'$  BID -Low Na diet  #History of PE/DVT: No longer on xarelto due to anemia. On DAPT. -Continue ASA and plavix -Not on AC due to anemia  #HLD: -Continue lipitor '80mg'$  daily -LDL 42 on 01/2021  #HTN: Fairly well controlled, mainly 120-130s at home. -Continue coreg 12.'5mg'$  BID  #DMII on Insulin: A1C 8.5. -Management per PCP -Goal A1C <7  #Anemia:  Denies bleeding. HgB stable at 8.8. -Management per PCP      Medication Adjustments/Labs and Tests Ordered: Current medicines are reviewed at length with the patient today.  Concerns regarding medicines are outlined above.  No orders of the defined types were placed in this encounter.  No orders of the defined types were placed in this encounter.   Patient Instructions  Medication Instructions:  Your physician recommends that you continue on your current medications as  directed. Please refer to the Current Medication list given to you today.  *If you need a refill on your cardiac medications before your next appointment, please call your pharmacy*   Lab Work: None If you have labs (blood work) drawn today and your tests are completely normal, you will receive your results only by: [place] (if you have MyChart) OR A  paper copy in the mail If you have any lab test that is abnormal or we need to change your treatment, we will call you to review the results.  Follow-Up: At [facility], you and your health needs are our priority.  As part of our continuing mission to provide you with exceptional heart care, we have created designated Provider Care Teams.  These Care Teams include your primary Cardiologist (physician) and Advanced Practice Providers (APPs -  Physician Assistants and Nurse Practitioners) who all work together to provide you with the care you need, when you need it.  Your next appointment:   6 month(s)  The format for your next appointment:   In Person  Provider:   Gwyndolyn Kaufman, MD  Important Information About Sugar         I,Mykaella Javier,acting as a scribe for Freada Bergeron, MD.,have documented all relevant documentation on the behalf of Freada Bergeron, MD,as directed by  Freada Bergeron, MD while in the presence of Freada Bergeron, MD.  I, Freada Bergeron, MD, have reviewed all documentation for this visit. The documentation on 12/30/21 for the exam, diagnosis, procedures, and orders are all accurate and complete.   Signed, Freada Bergeron, MD  12/30/2021 10:43 AM    [place]

## 2021-12-20 NOTE — Telephone Encounter (Signed)
Almara the physician from [facility]. Would like a verbal order for the pt. 1 week 6 she can be reached at [phone]

## 2021-12-21 ENCOUNTER — Telehealth: Payer: Self-pay | Admitting: Family Medicine

## 2021-12-21 ENCOUNTER — Other Ambulatory Visit: Payer: Self-pay | Admitting: Nurse Practitioner

## 2021-12-21 DIAGNOSIS — K219 Gastro-esophageal reflux disease without esophagitis: Secondary | ICD-10-CM

## 2021-12-21 NOTE — Telephone Encounter (Signed)
[place] Name: Will OT w/[facility] Callback Phone #: [phone]  Service Requested: OT  Frequency of Visits: requesting VO - 1x wk for 4 wks ext for OT

## 2021-12-21 NOTE — Telephone Encounter (Signed)
Provided Almara with annotation below. She verbalized understanding.

## 2021-12-22 NOTE — Telephone Encounter (Signed)
Advised Will OT with [facility] about annotation below. He verbalized understanding.

## 2021-12-26 ENCOUNTER — Telehealth: Payer: Self-pay | Admitting: Family Medicine

## 2021-12-26 ENCOUNTER — Other Ambulatory Visit: Payer: Self-pay | Admitting: Nurse Practitioner

## 2021-12-26 ENCOUNTER — Other Ambulatory Visit: Payer: Self-pay | Admitting: Family Medicine

## 2021-12-26 DIAGNOSIS — K219 Gastro-esophageal reflux disease without esophagitis: Secondary | ICD-10-CM

## 2021-12-26 DIAGNOSIS — F419 Anxiety disorder, unspecified: Secondary | ICD-10-CM

## 2021-12-26 NOTE — Telephone Encounter (Signed)
Left [facility] a detailed voice message to return call to office.

## 2021-12-26 NOTE — Telephone Encounter (Signed)
Caller Name: [facility]  Call back phone #: [phone]  Reason for Call: Called to inform Dr. Grandville Silos that pt had been feeling better after eating. During OT there was shortness of breath, lightheadedness, and fatigue. BP came back normal but they could not measure blood sugar because machine was down.

## 2021-12-27 ENCOUNTER — Ambulatory Visit (INDEPENDENT_AMBULATORY_CARE_PROVIDER_SITE_OTHER): Payer: PPO | Admitting: Podiatry

## 2021-12-27 ENCOUNTER — Encounter: Payer: Self-pay | Admitting: Student in an Organized Health Care Education/Training Program

## 2021-12-27 ENCOUNTER — Ambulatory Visit
Payer: PPO | Attending: Student in an Organized Health Care Education/Training Program | Admitting: Student in an Organized Health Care Education/Training Program

## 2021-12-27 ENCOUNTER — Encounter: Payer: Self-pay | Admitting: Podiatry

## 2021-12-27 VITALS — BP 154/64 | HR 61 | Temp 97.0°F | Ht 67.0 in | Wt 304.0 lb

## 2021-12-27 DIAGNOSIS — M170 Bilateral primary osteoarthritis of knee: Secondary | ICD-10-CM | POA: Diagnosis not present

## 2021-12-27 DIAGNOSIS — M79641 Pain in right hand: Secondary | ICD-10-CM | POA: Diagnosis not present

## 2021-12-27 DIAGNOSIS — B351 Tinea unguium: Secondary | ICD-10-CM | POA: Diagnosis not present

## 2021-12-27 DIAGNOSIS — M25521 Pain in right elbow: Secondary | ICD-10-CM | POA: Diagnosis not present

## 2021-12-27 DIAGNOSIS — G894 Chronic pain syndrome: Secondary | ICD-10-CM | POA: Diagnosis not present

## 2021-12-27 DIAGNOSIS — G8929 Other chronic pain: Secondary | ICD-10-CM | POA: Diagnosis not present

## 2021-12-27 DIAGNOSIS — M21969 Unspecified acquired deformity of unspecified lower leg: Secondary | ICD-10-CM

## 2021-12-27 DIAGNOSIS — M48062 Spinal stenosis, lumbar region with neurogenic claudication: Secondary | ICD-10-CM | POA: Diagnosis not present

## 2021-12-27 DIAGNOSIS — M79609 Pain in unspecified limb: Secondary | ICD-10-CM

## 2021-12-27 DIAGNOSIS — N183 Chronic kidney disease, stage 3 unspecified: Secondary | ICD-10-CM

## 2021-12-27 DIAGNOSIS — E1169 Type 2 diabetes mellitus with other specified complication: Secondary | ICD-10-CM

## 2021-12-27 DIAGNOSIS — M47816 Spondylosis without myelopathy or radiculopathy, lumbar region: Secondary | ICD-10-CM | POA: Diagnosis not present

## 2021-12-27 MED ORDER — OXYCODONE-ACETAMINOPHEN 7.5-325 MG PO TABS: 1.0000 | ORAL_TABLET | Freq: Two times a day (BID) | ORAL | 0 refills | Status: AC | PRN

## 2021-12-27 MED ORDER — OXYCODONE-ACETAMINOPHEN 7.5-325 MG PO TABS: 1.0000 | ORAL_TABLET | Freq: Two times a day (BID) | ORAL | 0 refills | Status: DC | PRN

## 2021-12-27 NOTE — Progress Notes (Signed)
This patient returns to my office for at risk foot care.  This patient requires this care by a professional since this patient will be at risk due to having diabetes and CKD. She presents to the office with her daughter in a wheelchair.  This patient is unable to cut nails herself since the patient cannot reach her nails.These nails are painful walking and wearing shoes.  This patient presents for at risk foot care today.  General Appearance  Alert, conversant and in no acute stress.  Vascular  Dorsalis pedis and posterior tibial  pulses are palpable  bilaterally.  Capillary return is within normal limits  bilaterally. Temperature is within normal limits  bilaterally.  Neurologic  Senn-Weinstein monofilament wire test within normal limits  bilaterally. Muscle power within normal limits bilaterally.  Nails Thick disfigured discolored nails with subungual debris  from hallux to fifth toes bilaterally. No evidence of bacterial infection or drainage bilaterally.  Orthopedic  No limitations of motion  feet .  No crepitus or effusions noted.  No bony pathology or digital deformities noted. Deviation of toes 2-5 right foot at the MPJ.  Skin  normotropic skin with no porokeratosis noted bilaterally.  No signs of infections or ulcers noted.     Onychomycosis  Pain in right toes  Pain in left toes  Consent was obtained for treatment procedures.   Mechanical debridement of nails 1-5  bilaterally performed with a nail nipper.  Filed with dremel without incident.    Return office visit  6  months                   Told patient to return for periodic foot care and evaluation due to potential at risk complications. Patient had difficulty with the dremel tool usage.  Therefore I discontinued dremel tool usage.   [name] DPM

## 2021-12-27 NOTE — Progress Notes (Signed)
PROVIDER NOTE: Information contained herein reflects review and annotations entered in association with encounter. Interpretation of such information and data should be left to medically-trained personnel. Information provided to patient can be located elsewhere in the medical record under "Patient Instructions". Document created using STT-dictation technology, any transcriptional errors that may result from process are unintentional.    Patient: [name]  Service Category: E/M  Provider: Gillis Santa, MD  DOB: May 04, 1935  DOS: 12/27/2021  Specialty: Interventional Pain Management  MRN: [id number]  Setting: Ambulatory outpatient  PCP: Nichole Hollow, MD  Type: Established Patient    Referring Provider: [name], NP  Location: Office  Delivery: Face-to-face     HPI  [name], a 87 y.o. year old female, is here today because of her Chronic pain of right hand [M79.641, G89.29]. [name] primary complain today is Shoulder Pain (left)  Last encounter: My last encounter with her was on 10/11/21  Pertinent problems: [name] has Chronic radicular lumbar pain; Lumbar spondylosis; Spinal stenosis, lumbar region, with neurogenic claudication; and Bilateral primary osteoarthritis of knee on their pertinent problem list. Pain Assessment: Severity of Chronic pain is reported as a 7 /10. Location: Shoulder Left/radiates into left arm. Onset: More than a month ago. Quality: Aching. Timing: Constant. Modifying factor(s): medication. Vitals:  height is '5\' 7"'  (1.702 m) and weight is 304 lb (137.9 kg) (abnormal). Her temperature is 97 F (36.1 C) (abnormal). Her blood pressure is 154/64 (abnormal) and her pulse is 61. Her oxygen saturation is 100%.   Reason for encounter: medication management.    [name] presents today for medication management.  Unfortunately, she was hospitalized for 4 days for pulmonary edema.  She was diuresed via IV Lasix and then transitioned to oral Lasix.   She states that her shortness of breath has improved.  Of note, during her last visit, she endorsed suboptimal pain relief on her current regimen so we increased her Percocet from 5 mg to 7.5 mg twice daily and she states that she is doing better at managing her pain with that dose.  I will refill it as below.  She states that she has discontinued Cymbalta.  We will attempt to renew our annual urine toxicology screen today.    Pharmacotherapy Assessment    Analgesic: Percocet 7.5 mg twice daily as needed, quantity 60/month  Monitoring: [name] PMP: PDMP reviewed during this encounter.       Pharmacotherapy: No side-effects or adverse reactions reported. Compliance: No problems identified. Effectiveness: Clinically acceptable.  Nichole Shorter, RN  12/27/2021  9:51 AM  Sign when Signing Visit Nursing Pain Medication Assessment:  Safety precautions to be maintained throughout the outpatient stay will include: orient to surroundings, keep bed in low position, maintain call bell within reach at all times, provide assistance with transfer out of bed and ambulation.  Medication Inspection Compliance: Pill count conducted under aseptic conditions, in front of the patient. Neither the pills nor the bottle was removed from the patient's sight at any time. Once count was completed pills were immediately returned to the patient in their original bottle.  Medication: Oxycodone/APAP Pill/Patch Count:  3 of 60 pills remain Pill/Patch Appearance: Markings consistent with prescribed medication Bottle Appearance: Standard pharmacy container. Clearly labeled. Filled Date: 07 / 25 / 2023 Last Medication intake:  Today   UDS: : Final result   Visible to patient: Yes (not seen)   Next appt: 10/26/2020 at 01:45 PM in Podiatry [name], DPM)  Dx: Chronic pain syndrome   0 Result Notes   Ref Range & Units 6 mo ago  Amphetamines, IA Cutoff:50 ng/mL Negative   Barbiturates, IA Cutoff:0.1 ug/mL Negative    Benzodiazepines, IA Cutoff:20 ng/mL Negative   Cocaine & Metabolite, IA Cutoff:25 ng/mL Negative   Phencyclidine, IA Cutoff:8 ng/mL Negative   THC(Marijuana) Metabolite, IA Cutoff:5 ng/mL Negative   Opiates, IA Cutoff:5 ng/mL Negative   Oxycodones, IA Cutoff:5 ng/mL Negative   Comment: Presumptive immunoassay result indicated need for further  testing; definitive confirmation was negative.   Methadone, IA Cutoff:25 ng/mL Negative   Propoxyphene, IA Cutoff:50 ng/mL Negative   Comment: This test was developed and its performance characteristics  determined by [facility].  It has not been cleared or approved  by the Food and Drug Administration.         ROS  Constitutional: Denies any fever or chills Gastrointestinal: No reported hemesis, hematochezia, vomiting, or acute GI distress Musculoskeletal:  left shoulder pain, low back pain Neurological: No reported episodes of acute onset apraxia, aphasia, dysarthria, agnosia, amnesia, paralysis, loss of coordination, or loss of consciousness  Medication Review  DULoxetine, FreeStyle Libre 14 Day Sensor, Nebulizer System All-In-One, Nebulizer/Tubing/Mouthpiece, OVER THE COUNTER MEDICATION, Vitamin D (Ergocalciferol), albuterol, anastrozole, aspirin EC, atorvastatin, budesonide-formoterol, carvedilol, cetirizine, clopidogrel, diclofenac Sodium, escitalopram, febuxostat, fluticasone, folic acid, guaiFENesin, guaifenesin, hydrOXYzine, insulin aspart, insulin glargine (2 Unit Dial), ipratropium-albuterol, linaclotide, meclizine, memantine, nitroGLYCERIN, nystatin cream, oxyCODONE-acetaminophen, pantoprazole, polyethylene glycol powder, sodium polystyrene, and torsemide  History Review  Allergy: [name] is allergic to sulfonamide derivatives, lokelma [sodium zirconium cyclosilicate], aricept [donepezil hcl], and tramadol. Drug: [name]  reports no history of drug use. Alcohol:  reports no history of alcohol use. Tobacco:  reports that she has  never smoked. She has been exposed to tobacco smoke. She has never used smokeless tobacco. Social: [name]  reports that she has never smoked. She has been exposed to tobacco smoke. She has never used smokeless tobacco. She reports that she does not drink alcohol and does not use drugs. Medical:  has a past medical history of Abdominal pain (01/26/2012), Acute kidney injury superimposed on chronic kidney disease ([place]) (12/18/2014), Allergy, Anemia, unspecified, Anxiety, Anxiety associated with depression (06/08/2017), Arthritis, Breast cancer ([place]), Breast cancer screening (02/19/2014), Breast mass (10/27/2014), Chronic diastolic CHF (congestive heart failure) ([place]), Chronic pain syndrome (02/06/2015), CKD (chronic kidney disease), stage III ([place]), Coronary artery disease, Debility (08/01/2012), Dementia ([place]), Depression, Diabetes mellitus, DOE (dyspnea on exertion) (06/24/2014), Dysphagia, unspecified(787.20) (07/31/2012), Fungal dermatitis (11/13/2007), GERD (gastroesophageal reflux disease), Gout, unspecified, Headache, History of blood clots, History of deep venous thrombosis (01/27/2012), History of pulmonary embolus (PE) (04/22/2014), Hyperkalemia (07/26/2017), Hyperlipidemia, Hypertension, Insomnia, Joint pain, Joint swelling, Morbid obesity ([place]), Multiple benign nevi (11/15/2016), Myocardial infarction ([place]) (2004), Non-STEMI (non-ST elevated myocardial infarction) ([place]) (02/15/2020), Numbness, Obstructive sleep apnea, Osteoarthritis, Osteoarthritis, Osteoarthrosis, unspecified whether generalized or localized, lower leg, Pain in the chest (12/31/2013), Pain, chronic, Personal history of other diseases of the digestive system (12/03/2006), Polymyalgia rheumatica ([place]), Presence of stent in right coronary artery, Pulmonary emboli ([place]) (01/2012), Situational depression (06/04/2009), Syncope, vasovagal (07/04/2021), Urinary incontinence, Vaginitis and vulvovaginitis (06/23/2016), Vertigo, and Wears dentures. Surgical: Ms.  Sigman  has a past surgical history that includes Appendectomy; Cardiac catheterization; Gastric bypass (1977 ); blood clots/legs and lungs (2013); MI with stent placement (2004); left heart catheterization with coronary angiogram (N/A, 06/29/2014); Coronary angioplasty (2); Eye surgery (Bilateral); Colonoscopy; Abdominal hysterectomy; Breast lumpectomy with radioactive seed localization (Left, 11/05/2014); Excision of skin tag (  Right, 11/05/2014); Breast lumpectomy (Left, 11/05/2014); Breast biopsy (Left, 07/22/2014); Breast biopsy (Left, 02/10/2013); Esophagogastroduodenoscopy (egd) with propofol (N/A, 11/07/2016); Pars plana vitrectomy (Right, 10/23/2018); Membrane peel (Right, 10/23/2018); LEFT HEART CATH AND CORONARY ANGIOGRAPHY (N/A, 02/17/2020); and CORONARY STENT INTERVENTION (N/A, 02/17/2020). Family: family history includes Arthritis in her father and sister; Breast cancer (age of onset: 60) in her mother; Diabetes in her father and sister; Heart disease in her cousin and mother; Hypertension in her father; Obesity in her sister; Throat cancer in her father.  Laboratory Chemistry Profile   Renal Lab Results  Component Value Date   BUN 57 (H) 12/05/2021   CREATININE 1.90 (H) 12/05/2021   LABCREA 36 06/14/2017   BCR 23 (H) 08/24/2021   GFR 23.84 (L) 12/05/2021   GFRAA 38 (L) 03/02/2020   GFRNONAA 22 (L) 11/26/2021     Hepatic Lab Results  Component Value Date   AST 11 (L) 11/22/2021   ALT 11 11/22/2021   ALBUMIN 2.4 (L) 11/22/2021   ALKPHOS 73 11/22/2021   LIPASE 30 11/19/2021     Electrolytes Lab Results  Component Value Date   NA 136 12/05/2021   K 5.6 (H) 12/05/2021   CL 102 12/05/2021   CALCIUM 9.1 12/05/2021   MG 1.3 (L) 11/23/2021   PHOS 3.7 10/09/2017     Bone Lab Results  Component Value Date   VD25OH 64.31 09/09/2021     Inflammation (CRP: Acute Phase) (ESR: Chronic Phase) Lab Results  Component Value Date   CRP 1.0 (H) 01/30/2019   ESRSEDRATE 110 (H) 09/25/2014    LATICACIDVEN 1.5 01/25/2019       Note: Above Lab results reviewed.  Recent Imaging Review  DG Chest 2 View CLINICAL DATA:  Chest pain  EXAM: CHEST - 2 VIEW  COMPARISON:  Previous studies including the examination of 10/16/2021  FINDINGS: Transverse diameter of heart is slightly increased. Central pulmonary vessels are prominent the there is increased density in the lateral aspect of left upper lung field. Rest of the lung fields are unremarkable. There is no significant pleural effusion or pneumothorax.  IMPRESSION: Increased density in left upper lung field may be an artifact due to chest wall attenuation or suggest asymmetric pulmonary edema or pneumonia.  Electronically Signed   By: Elmer Picker M.D.   On: 11/19/2021 18:02  Note: Reviewed        Physical Exam  General appearance: Well nourished, well developed, and well hydrated. In no apparent acute distress Mental status: Alert, oriented x 3 (person, place, & time)       Respiratory: No evidence of acute respiratory distress Eyes: PERLA Vitals: BP (!) 154/64   Pulse 61   Temp (!) 97 F (36.1 C)   Ht '5\' 7"'  (1.702 m)   Wt (!) 304 lb (137.9 kg)   SpO2 100%   BMI 47.61 kg/m  BMI: Estimated body mass index is 47.61 kg/m as calculated from the following:   Height as of this encounter: '5\' 7"'  (1.702 m).   Weight as of this encounter: 304 lb (137.9 kg). Ideal: Ideal body weight: 61.6 kg (135 lb 12.9 oz) Adjusted ideal body weight: 92.1 kg (203 lb 1.3 oz)  Thoracic Spine Area Exam  Skin & Axial Inspection: No masses, redness, or swelling Alignment: Symmetrical Functional ROM: Pain restricted ROM Stability: No instability detected Muscle Tone/Strength: Functionally intact. No obvious neuro-muscular anomalies detected. Sensory (Neurological): Unimpaired Muscle strength & Tone: No palpable anomalies  Bilateral shoulder pain  Right hand pain  Lumbar Exam  Skin & Axial Inspection: No masses,  redness, or swelling Alignment: Symmetrical Functional ROM: Pain restricted ROM       Stability: No instability detected Muscle Tone/Strength: Functionally intact. No obvious neuro-muscular anomalies detected. Sensory (Neurological): Musculoskeletal pain pattern  Ambulation: Patient came in today in a wheel chair Gait: Modified gait pattern (slower gait speed, wider stride width, and longer stance duration) associated with morbid obesity Posture: Difficulty standing up straight, due to pain    Lower Extremity Exam      Side: Right lower extremity   Side: Left lower extremity  Stability: No instability observed           Stability: No instability observed          Skin & Extremity Inspection: Skin color, temperature, and hair growth are WNL. No peripheral edema or cyanosis. No masses, redness, swelling, asymmetry, or associated skin lesions. No contractures.   Skin & Extremity Inspection: Skin color, temperature, and hair growth are WNL. No peripheral edema or cyanosis. No masses, redness, swelling, asymmetry, or associated skin lesions. No contractures.  Functional ROM: Pain restricted ROM for all joints of the lower extremity           Functional ROM: Pain restricted ROM for all joints of the lower extremity          Muscle Tone/Strength: Functionally intact. No obvious neuro-muscular anomalies detected.   Muscle Tone/Strength: Functionally intact. No obvious neuro-muscular anomalies detected.  Sensory (Neurological): Neurogenic pain pattern         Sensory (Neurological): Neurogenic pain pattern        DTR: Patellar: deferred today Achilles: deferred today Plantar: deferred today   DTR: Patellar: deferred today Achilles: deferred today Plantar: deferred today  Palpation: No palpable anomalies   Palpation: No palpable anomalies     Assessment   Status Diagnosis  Persistent Persistent Persistent 1. Chronic pain of right hand   2. Lumbar spondylosis   3. Right elbow pain   4.  Spinal stenosis, lumbar region, with neurogenic claudication   5. Bilateral primary osteoarthritis of knee   6. Lumbar facet arthropathy   7. Chronic pain syndrome       Plan of Care  [name] has a current medication list which includes the following long-term medication(s): albuterol, atorvastatin, budesonide-formoterol, carvedilol, escitalopram, escitalopram, febuxostat, fluticasone, toujeo max solostar, ipratropium-albuterol, linaclotide, memantine, pantoprazole, and torsemide.  Pharmacotherapy (Medications Ordered): Meds ordered this encounter  Medications   oxyCODONE-acetaminophen (PERCOCET) 7.5-325 MG tablet    Sig: Take 1 tablet by mouth every 12 (twelve) hours as needed for moderate pain or severe pain. Must last 30 days.    Dispense:  60 tablet    Refill:  0    Chronic Pain: STOP Act (Not applicable) Fill 1 day early if closed on refill date. Avoid benzodiazepines within 8 hours of opioids   oxyCODONE-acetaminophen (PERCOCET) 7.5-325 MG tablet    Sig: Take 1 tablet by mouth every 12 (twelve) hours as needed for moderate pain or severe pain. Must last 30 days.    Dispense:  60 tablet    Refill:  0    Chronic Pain: STOP Act (Not applicable) Fill 1 day early if closed on refill date. Avoid benzodiazepines within 8 hours of opioids   oxyCODONE-acetaminophen (PERCOCET) 7.5-325 MG tablet    Sig: Take 1 tablet by mouth every 12 (twelve) hours as needed for moderate pain or severe pain. Must last 30 days.    Dispense:  60 tablet    Refill:  0    Chronic Pain: STOP Act (Not applicable) Fill 1 day early if closed on refill date. Avoid benzodiazepines within 8 hours of opioids    Orders Placed This Encounter  Procedures   ToxASSURE Select 13 (MW), Urine    Volume: 30 ml(s). Minimum 3 ml of urine is needed. Document temperature of fresh sample. Indications: Long term (current) use of opiate analgesic [phone])    Order Specific Question:   Release to patient    Answer:    Immediate     Follow-up plan:   Return in about 3 months (around 03/29/2022) for Medication Management, in person.      Interventional management options:  Considering:   Intra-articular knee Hyalgan injections can be done in wheelchair Patient is on Plavix and is morbidly obese and unable to get on fluoroscopy table for interventional therapy.   PRN Procedures:   None at this time      Recent Visits Date Type Provider Dept  10/11/21 Office Visit Nichole Santa, MD [facility]  Showing recent visits within past 90 days and meeting all other requirements Today's Visits Date Type Provider Dept  12/27/21 Office Visit Nichole Santa, MD [facility]  Showing today's visits and meeting all other requirements Future Appointments No visits were found meeting these conditions. Showing future appointments within next 90 days and meeting all other requirements  I discussed the assessment and treatment plan with the patient. The patient was provided an opportunity to ask questions and all were answered. The patient agreed with the plan and demonstrated an understanding of the instructions.  Patient advised to call back or seek an in-person evaluation if the symptoms or condition worsens.  Duration of encounter: 30 minutes.  Note by: Nichole Santa, MD Date: 12/27/2021; Time: 10:24 AM

## 2021-12-27 NOTE — Progress Notes (Signed)
Nursing Pain Medication Assessment:  Safety precautions to be maintained throughout the outpatient stay will include: orient to surroundings, keep bed in low position, maintain call bell within reach at all times, provide assistance with transfer out of bed and ambulation.  Medication Inspection Compliance: Pill count conducted under aseptic conditions, in front of the patient. Neither the pills nor the bottle was removed from the patient's sight at any time. Once count was completed pills were immediately returned to the patient in their original bottle.  Medication: Oxycodone/APAP Pill/Patch Count:  3 of 60 pills remain Pill/Patch Appearance: Markings consistent with prescribed medication Bottle Appearance: Standard pharmacy container. Clearly labeled. Filled Date: 07 / 25 / 2023 Last Medication intake:  Today

## 2021-12-30 ENCOUNTER — Encounter: Payer: Self-pay | Admitting: Cardiology

## 2021-12-30 ENCOUNTER — Ambulatory Visit (INDEPENDENT_AMBULATORY_CARE_PROVIDER_SITE_OTHER): Payer: HMO | Admitting: Cardiology

## 2021-12-30 VITALS — BP 139/62 | HR 60 | Ht 67.0 in | Wt 309.6 lb

## 2021-12-30 DIAGNOSIS — Z86711 Personal history of pulmonary embolism: Secondary | ICD-10-CM

## 2021-12-30 DIAGNOSIS — I5032 Chronic diastolic (congestive) heart failure: Secondary | ICD-10-CM

## 2021-12-30 DIAGNOSIS — I10 Essential (primary) hypertension: Secondary | ICD-10-CM

## 2021-12-30 DIAGNOSIS — E119 Type 2 diabetes mellitus without complications: Secondary | ICD-10-CM

## 2021-12-30 DIAGNOSIS — I25118 Atherosclerotic heart disease of native coronary artery with other forms of angina pectoris: Secondary | ICD-10-CM | POA: Diagnosis not present

## 2021-12-30 NOTE — Patient Instructions (Signed)
Medication Instructions:  Your physician recommends that you continue on your current medications as directed. Please refer to the Current Medication list given to you today.  *If you need a refill on your cardiac medications before your next appointment, please call your pharmacy*   Lab Work: None If you have labs (blood work) drawn today and your tests are completely normal, you will receive your results only by: [place] (if you have MyChart) OR A paper copy in the mail If you have any lab test that is abnormal or we need to change your treatment, we will call you to review the results.  Follow-Up: At [facility], you and your health needs are our priority.  As part of our continuing mission to provide you with exceptional heart care, we have created designated Provider Care Teams.  These Care Teams include your primary Cardiologist (physician) and Advanced Practice Providers (APPs -  Physician Assistants and Nurse Practitioners) who all work together to provide you with the care you need, when you need it.  Your next appointment:   6 month(s)  The format for your next appointment:   In Person  Provider:   Gwyndolyn Kaufman, MD  Important Information About Sugar

## 2022-01-02 ENCOUNTER — Encounter (HOSPITAL_COMMUNITY): Payer: Self-pay

## 2022-01-02 ENCOUNTER — Encounter: Payer: Self-pay | Admitting: Hematology

## 2022-01-10 ENCOUNTER — Encounter (HOSPITAL_COMMUNITY): Payer: Self-pay

## 2022-01-10 ENCOUNTER — Encounter: Payer: Self-pay | Admitting: Hematology

## 2022-01-11 ENCOUNTER — Other Ambulatory Visit: Payer: Self-pay | Admitting: Family Medicine

## 2022-01-11 ENCOUNTER — Other Ambulatory Visit: Payer: Self-pay | Admitting: Nurse Practitioner

## 2022-01-11 ENCOUNTER — Other Ambulatory Visit: Payer: Self-pay | Admitting: Hematology

## 2022-01-11 DIAGNOSIS — Z5329 Procedure and treatment not carried out because of patient's decision for other reasons: Secondary | ICD-10-CM | POA: Diagnosis not present

## 2022-01-11 DIAGNOSIS — R110 Nausea: Secondary | ICD-10-CM | POA: Diagnosis not present

## 2022-01-11 DIAGNOSIS — R42 Dizziness and giddiness: Secondary | ICD-10-CM | POA: Diagnosis not present

## 2022-01-12 ENCOUNTER — Telehealth (INDEPENDENT_AMBULATORY_CARE_PROVIDER_SITE_OTHER): Payer: HMO | Admitting: Family Medicine

## 2022-01-12 DIAGNOSIS — R42 Dizziness and giddiness: Secondary | ICD-10-CM | POA: Diagnosis not present

## 2022-01-12 DIAGNOSIS — G894 Chronic pain syndrome: Secondary | ICD-10-CM | POA: Diagnosis not present

## 2022-01-12 DIAGNOSIS — I130 Hypertensive heart and chronic kidney disease with heart failure and stage 1 through stage 4 chronic kidney disease, or unspecified chronic kidney disease: Secondary | ICD-10-CM | POA: Diagnosis not present

## 2022-01-12 DIAGNOSIS — I5033 Acute on chronic diastolic (congestive) heart failure: Secondary | ICD-10-CM | POA: Diagnosis not present

## 2022-01-12 DIAGNOSIS — J449 Chronic obstructive pulmonary disease, unspecified: Secondary | ICD-10-CM | POA: Diagnosis not present

## 2022-01-12 DIAGNOSIS — Z794 Long term (current) use of insulin: Secondary | ICD-10-CM | POA: Diagnosis not present

## 2022-01-12 DIAGNOSIS — I2510 Atherosclerotic heart disease of native coronary artery without angina pectoris: Secondary | ICD-10-CM | POA: Diagnosis not present

## 2022-01-12 DIAGNOSIS — E1122 Type 2 diabetes mellitus with diabetic chronic kidney disease: Secondary | ICD-10-CM | POA: Diagnosis not present

## 2022-01-12 DIAGNOSIS — F0390 Unspecified dementia without behavioral disturbance: Secondary | ICD-10-CM | POA: Diagnosis not present

## 2022-01-12 DIAGNOSIS — N1831 Chronic kidney disease, stage 3a: Secondary | ICD-10-CM | POA: Diagnosis not present

## 2022-01-12 DIAGNOSIS — E1140 Type 2 diabetes mellitus with diabetic neuropathy, unspecified: Secondary | ICD-10-CM | POA: Diagnosis not present

## 2022-01-12 NOTE — Progress Notes (Signed)
Virtual Visit via Video Note  I connected with [name] on 01/12/22 at  1:00 PM EDT by a video enabled telemedicine application and verified that I am speaking with the correct person using two identifiers.  Location: Patient: Home Provider: Office   I discussed the limitations of evaluation and management by telemedicine and the availability of in person appointments. The patient expressed understanding and agreed to proceed.  Due to technical difficulties, we were unable to complete visit with video.  History of Present Illness:  Vertigo Patient presents for evaluation of dizziness, chills, headache.  Patient was riding cart when the symptoms started yesterday.  Patient reports that the dizziness and chills have essentially resolved.  The dizziness was improved with meclizine.  Regarding the headache patient takes oxycodone 7.5 mg / 325 mg every 12 hours.  This is helping the headache some.  Patient with a history of vertigo.  She states that the attacks occur rarely and last a few minutes. Positions that worsen symptoms: any motion and turning head. Previous workup/treatments: CT scan of mild white matter disease 06/2021. Associated ear symptoms: none. Associated CNS symptoms: headaches. Recent infections: none. Head trauma: denied. Drug ingestion: none. Noise exposure: no occupational exposure.  Patient denies chest pain, fevers, rash shortness of breath, nausea, vomiting, diarrhea, abdominal pain, dysuria, hematuria.  Patient denies any slurred speech, blurry vision, difficulty swallowing, new extremity weakness.   Observations/Objective: Respiratory: Able to speak in full sentences  Assessment and Plan: Dizziness History of vertigo Mild persistent headache Possibly triggered while riding in vehicle Dizziness was improved with meclizine Headache improved with oxycodone that patient takes for chronic pain Remainder of CNS review of systems negative Most likely vertigo, however  cannot rule out TIA versus CVA, given no other remaining CNS symptoms, CVA/TIAs less likely Mild associated chills that have resolved, although viral illness is possible, given quick resolution and no other symptoms, I believe this likely a stress response after vertigo Overall improved, continue meclizine and oxycodone for symptomatic treatment Return precautions discussed including worsening CNS symptoms or onset of worsening viral symptoms   Follow Up Instructions:    I discussed the assessment and treatment plan with the patient. The patient was provided an opportunity to ask questions and all were answered. The patient agreed with the plan and demonstrated an understanding of the instructions.   The patient was advised to call back or seek an in-person evaluation if the symptoms worsen or if the condition fails to improve as anticipated.  I provided 15 minutes of non-face-to-face time during this encounter.   Bonnita Hollow, MD

## 2022-01-16 ENCOUNTER — Encounter: Payer: Self-pay | Admitting: Internal Medicine

## 2022-01-16 ENCOUNTER — Other Ambulatory Visit: Payer: Self-pay | Admitting: *Deleted

## 2022-01-16 ENCOUNTER — Other Ambulatory Visit: Payer: PPO | Admitting: Internal Medicine

## 2022-01-16 VITALS — Wt 309.0 lb

## 2022-01-16 DIAGNOSIS — E11649 Type 2 diabetes mellitus with hypoglycemia without coma: Secondary | ICD-10-CM | POA: Diagnosis not present

## 2022-01-16 DIAGNOSIS — I5032 Chronic diastolic (congestive) heart failure: Secondary | ICD-10-CM | POA: Diagnosis not present

## 2022-01-16 DIAGNOSIS — K5909 Other constipation: Secondary | ICD-10-CM

## 2022-01-16 DIAGNOSIS — D509 Iron deficiency anemia, unspecified: Secondary | ICD-10-CM

## 2022-01-16 DIAGNOSIS — Z515 Encounter for palliative care: Secondary | ICD-10-CM | POA: Diagnosis not present

## 2022-01-16 NOTE — Progress Notes (Signed)
[name] Consult Note Telephone: [phone]  Fax: [phone]   Date of encounter: 01/16/22 10:27 AM PATIENT NAME: [name] [id number] [id number]   [phone] (home)  DOB: 02/14/1935 MRN: [id number] PRIMARY CARE PROVIDER:    Bonnita Hollow, MD,  [place] [address] [phone]  REFERRING PROVIDER:   Bonnita Hollow, MD [place],  [place] [address] [phone]  RESPONSIBLE PARTY:    Contact Information     Name Relation Home Work [place] [phone] Daughter [phone]  [phone]   [name], [name] Daughter [phone]  [phone]   Hamock,LaVarious Sister   [phone]        Due to the COVID-19 crisis, this visit was done via telemedicine from my office and it was initiated and consent by this patient and or family.  I connected with  [name] OR PROXY on 01/16/22 by a telemedicine application and verified that I am speaking with the correct person using two identifiers.  This patient did not have the ability to connect via a video-enabled capacity.   I discussed the limitations of evaluation and management by telemedicine. The patient expressed understanding and agreed to proceed.  Palliative Care was asked to follow this patient by consultation request of  Bonnita Hollow, MD to address advance care planning and complex medical decision making. This is the initial visit.                                     ASSESSMENT AND PLAN / RECOMMENDATIONS:   Advance Care Planning/Goals of Care: Goals include to maximize quality of life and symptom management. Patient/health care surrogate gave his/her permission to discuss.Our advance care planning conversation included a discussion about:    The value and importance of advance care planning  Experiences with loved ones who have been seriously ill or have died  Exploration  of personal, cultural or spiritual beliefs that might influence medical decisions  Exploration of goals of care in the event of a sudden injury or illness  Identification  of a healthcare agent  Review and updating or creation of an  advance directive document . Decision not to resuscitate or to de-escalate disease focused treatments due to poor prognosis. CODE STATUS:  MOST:  DNR, limited additional interventions determining abx case by case, IVF trial, no tube feedings.  Says if there is nothing else that can be done to help her, she'd like to stay home rather than be hospitalized, but if she can be helped, she should be readmitted. Goal to stay at home long-term not nursing home.   She'd like to have less pain--she wants to be out of pain and she'd be able to tolerate life.  She does use the percocet every 12 hrs.  She has had to take an additional one before (pain in her leg about a month ago).  Symptom Management/Plan: 1. Chronic constipation -recommended regular senna s up to 4 tabs daily to help counteract her opioid regimen  2. Hypoglycemia due to type 2 diabetes mellitus ([facility] [place]) -improved with nightly snacks, off mealtime insulin  3. Chronic diastolic CHF (congestive heart failure) (HCC) -continue current regimen with weight and monitoring of volume overload evidence  4. Palliative care by specialist -discussed role of palliative care -pt does have MOST on file and prefers to avoid further hospitalizations if  at all possible -review further at home visit in person  Follow up Palliative Care Visit: Palliative care will continue to follow for complex medical decision making, advance care planning, and clarification of goals. Return Feb 27, 2022   This visit was coded based on medical decision making (MDM).  PPS: 40%  HOSPICE ELIGIBILITY/DIAGNOSIS: possibly chronic diastolic chf when ready for this  Chief Complaint: Initial palliative phone consult  HISTORY OF PRESENT ILLNESS:   [name] is a 87 y.o. year old female  with multiple longstanding comorbid illnesses including morbid obesity, CAD, DMII, chronic diastolic chf, HTN, h/o PE, lumbar spinal stenosis, bilateral knee OA, CKD3a, and severe major depression seen via phone for palliative consult.  Initial virtual visit was missed.  She was recently seen via phone visit by PCP, Dr. [place] Silos, 9/7 due to episode of vertigo while traveling in a car.  She was treated with meclizine which did help and her usual chronic oxycodone for her headache.      She's still not 100% back to herself--still a little dizzy.  She still has a little headache also.  Kidneys have been pretty stable lately--no better or worse.    CHF:  she is needing extra torsemide probably 3 times in 7 days.  This am when therapy was here, she had gained 2 lbs.  They're measuring her legs.    DMII:  up and down.  Has some lows.  Not eating that much.  No longer has an appetite.  [name] is trying to get her to eat, but not eating full meals, just 50% of her 2 meals per day.  Been an issue for about a month.  She does fill up quickly when she eats.  Says she's full after a couple of bites, then sugar drops.  It's gone to where it would not register low.  Check three times daily.  AM is the low.  Takes toujeo 42 units each am and novolog 14 units tid.  [name] is giving her a snack at night just before bed.  That has helped so far.    She is still short of breath.  Phlegm in her throat bothers her b/c she's too winded to get it out.  Recommended diabetic cough syrup.  They have a humidifier which has really helped.  She sometimes forgets the symbicort inhaler.  Not using albuterol inhaler.  She does have wheezing though so enforced use of albuterol.  She stays constipated.  If she takes linzess, then she has some incontinence of stool--will go 4 times, be watery and burn.  Says senokot-s didn't do her any good.    History obtained from review of EMR, discussion  with primary team, and interview with family, facility staff/caregiver and/or [name].   I reviewed available labs, medications, imaging, studies and related documents from the EMR.  Records reviewed and summarized above.   ROS Review of Systems  Constitutional:  Positive for activity change, appetite change, fatigue and unexpected weight change.  HENT:  Negative for trouble swallowing.   Respiratory:  Positive for cough, shortness of breath and wheezing. Negative for chest tightness.   Gastrointestinal:  Positive for constipation. Negative for abdominal pain, diarrhea, nausea and vomiting.  Endocrine:       Diabetes with some hypoglycemia  Genitourinary:  Negative for dysuria.  Musculoskeletal:  Positive for arthralgias, back pain and gait problem.  Skin:  Negative for color change.  Neurological:  Negative for weakness.  Psychiatric/Behavioral:  Depression    Physical Exam: Current and past weights: Vitals:   01/16/22 1027  Weight: (!) 309 lb (140.2 kg)   Body mass index is 48.4 kg/m. Wt Readings from Last 500 Encounters:  01/16/22 (!) 309 lb (140.2 kg)  12/30/21 (!) 309 lb 9.6 oz (140.4 kg)  12/27/21 (!) 304 lb (137.9 kg)  11/26/21 (!) 304 lb 14.3 oz (138.3 kg)  10/27/21 (!) 309 lb (140.2 kg)  10/25/21 (!) 310 lb (140.6 kg)  10/22/21 (!) 315 lb 14.7 oz (143.3 kg)  10/11/21 (!) 342 lb (155.1 kg)  08/24/21 (!) 342 lb 9.6 oz (155.4 kg)  08/23/21 (!) 309 lb (140.2 kg)  07/19/21 (!) 333 lb (151 kg)  07/08/21 (!) 311 lb 1.1 oz (141.1 kg)  05/18/21 (!) 313 lb (142 kg)  04/27/21 (!) 316 lb (143.3 kg)  03/09/21 (!) 316 lb 8 oz (143.6 kg)  01/26/21 (!) 319 lb (144.7 kg)  12/21/20 (!) 331 lb (150.1 kg)  12/17/20 (!) 331 lb 12.8 oz (150.5 kg)  10/12/20 (!) 332 lb (150.6 kg)  09/29/20 (!) 332 lb (150.6 kg)  09/07/20 (!) 330 lb 6.4 oz (149.9 kg)  07/20/20 (!) 338 lb (153.3 kg)  06/03/20 (!) 338 lb (153.3 kg)  04/12/20 (!) 338 lb (153.3 kg)  03/18/20 (!) 338 lb  (153.3 kg)  03/03/20 (!) 338 lb (153.3 kg)  03/02/20 (!) 338 lb (153.3 kg)  02/23/20 (!) 338 lb 1.6 oz (153.4 kg)  02/23/20 (!) 338 lb (153.3 kg)  02/14/20 (!) 353 lb (160.1 kg)  12/23/19 (!) 329 lb (149.2 kg)  09/12/19 (!) 320 lb (145.2 kg)  07/29/19 (!) 323 lb (146.5 kg)  07/24/19 (!) 322 lb (146.1 kg)  06/03/19 (!) 325 lb (147.4 kg)  05/26/19 (!) 312 lb (141.5 kg)  03/31/19 300 lb (136.1 kg)  03/24/19 (!) 330 lb (149.7 kg)  02/12/19 (!) 323 lb 3.2 oz (146.6 kg)  01/30/19 (!) 325 lb 9.9 oz (147.7 kg)  12/24/18 (!) 325 lb (147.4 kg)  12/20/18 (!) 323 lb (146.5 kg)  12/18/18 (!) 329 lb 12.8 oz (149.6 kg)  12/18/18 (!) 329 lb 12.8 oz (149.6 kg)  10/23/18 (!) 325 lb (147.4 kg)  07/17/18 (!) 323 lb (146.5 kg)  07/03/18 (!) 323 lb (146.5 kg)  06/03/18 (!) 324 lb (147 kg)  05/28/18 (!) 325 lb 1.6 oz (147.5 kg)  04/12/18 (!) 323 lb (146.5 kg)  03/11/18 (!) 321 lb (145.6 kg)  12/11/17 (!) 327 lb (148.3 kg)  11/28/17 (!) 325 lb (147.4 kg)  11/27/17 (!) 325 lb 12.8 oz (147.8 kg)  11/21/17 300 lb (136.1 kg)  11/05/17 (!) 323 lb (146.5 kg)  10/30/17 (!) 327 lb 12.8 oz (148.7 kg)  10/24/17 (!) 333 lb 12.8 oz (151.4 kg)  10/23/17 (!) 332 lb 11.2 oz (150.9 kg)  10/10/17 (!) 341 lb 14.9 oz (155.1 kg)  09/07/17 (!) 341 lb (154.7 kg)  08/27/17 (!) 350 lb (158.8 kg)  08/02/17 (!) 350 lb (158.8 kg)  07/26/17 (!) 350 lb 1.5 oz (158.8 kg)  06/08/17 (!) 345 lb (156.5 kg)  05/21/17 (!) 336 lb (152.4 kg)  04/23/17 (!) 340 lb (154.2 kg)  04/02/17 (!) 347 lb (157.4 kg)  04/02/17 (!) 347 lb (157.4 kg)  03/21/17 (!) 341 lb 9.6 oz (154.9 kg)  02/27/17 (!) 343 lb (155.6 kg)  02/26/17 (!) 343 lb (155.6 kg)  01/29/17 (!) 344 lb 3.2 oz (156.1 kg)  01/01/17 (!) 341 lb (154.7 kg)  12/12/16 [name])  341 lb (154.7 kg)  12/06/16 (!) 341 lb (154.7 kg)  11/15/16 (!) 346 lb 12.8 oz (157.3 kg)  11/06/16 (!) 340 lb 3.2 oz (154.3 kg)  10/26/16 (!) 333 lb (151 kg)  10/13/16 (!) 333 lb (151 kg)  10/09/16 (!)  333 lb (151 kg)  09/11/16 (!) 337 lb (152.9 kg)  08/14/16 (!) 341 lb (154.7 kg)  08/02/16 (!) 338 lb (153.3 kg)  07/27/16 (!) 338 lb 6.4 oz (153.5 kg)  07/23/16 (!) 341 lb (154.7 kg)  07/03/16 (!) 344 lb 6.4 oz (156.2 kg)  06/23/16 (!) 339 lb 9.6 oz (154 kg)  05/22/16 (!) 339 lb 6.4 oz (154 kg)  04/24/16 (!) 344 lb (156 kg)  03/27/16 (!) 342 lb (155.1 kg)  03/20/16 (!) 341 lb (154.7 kg)  02/21/16 (!) 349 lb (158.3 kg)  02/02/16 (!) 343 lb (155.6 kg)  01/24/16 (!) 343 lb (155.6 kg)  12/13/15 (!) 341 lb 12.8 oz (155 kg)  12/08/15 (!) 344 lb (156 kg)  12/06/15 (!) 347 lb (157.4 kg)  11/01/15 (!) 343 lb 9.6 oz (155.9 kg)  10/11/15 (!) 340 lb (154.2 kg)  09/24/15 (!) 344 lb (156 kg)  09/06/15 (!) 344 lb 6.4 oz (156.2 kg)  08/09/15 (!) 343 lb (155.6 kg)  08/04/15 (!) 341 lb (154.7 kg)  07/30/15 (!) 341 lb 4.8 oz (154.8 kg)  06/02/15 (!) 344 lb 3.2 oz (156.1 kg)  04/26/15 (!) 351 lb 6.4 oz (159.4 kg)  04/21/15 (!) 348 lb (157.9 kg)  03/29/15 (!) 350 lb 9.6 oz (159 kg)  02/22/15 (!) 352 lb 9.6 oz (159.9 kg)  02/05/15 (!) 355 lb 12.8 oz (161.4 kg)  01/20/15 (!) 355 lb 14.4 oz (161.4 kg)  01/18/15 (!) 347 lb 9.6 oz (157.7 kg)  12/16/14 (!) 360 lb 6.4 oz (163.5 kg)  12/14/14 (!) 359 lb 12.8 oz (163.2 kg)  11/19/14 (!) 367 lb (166.5 kg)  11/18/14 (!) 368 lb (166.9 kg)  11/05/14 (!) 366 lb (166 kg)  10/29/14 (!) 366 lb (166 kg)  10/27/14 (!) 366 lb (166 kg)  10/07/14 (!) 370 lb (167.8 kg)  09/26/14 (!) 358 lb 12.8 oz (162.8 kg)  09/23/14 (!) 366 lb (166 kg)  09/17/14 (!) 366 lb 4 oz (166.1 kg)  09/07/14 (!) 370 lb (167.8 kg)  09/04/14 (!) 370 lb 3.2 oz (167.9 kg)  08/12/14 (!) 359 lb (162.8 kg)  07/08/14 (!) 368 lb (166.9 kg)  06/25/14 (!) 371 lb (168.3 kg)  06/08/14 (!) 362 lb (164.2 kg)  06/04/14 (!) 361 lb (163.7 kg)  05/25/14 (!) 361 lb (163.7 kg)  04/21/14 (!) 356 lb 7.7 oz (161.7 kg)  03/09/14 (!) 375 lb (170.1 kg)  02/18/14 (!) 375 lb (170.1 kg)  02/17/14 (!) 374 lb  3.2 oz (169.7 kg)  12/31/13 (!) 357 lb (161.9 kg)  12/09/13 (!) 357 lb 12.8 oz (162.3 kg)  11/05/13 (!) 343 lb (155.6 kg)  10/20/13 (!) 356 lb (161.5 kg)  10/14/13 (!) 357 lb (161.9 kg)  10/14/13 (!) 357 lb (161.9 kg)  09/23/13 (!) 350 lb (158.8 kg)  07/29/13 (!) 356 lb 3.2 oz (161.6 kg)  06/04/13 (!) 354 lb 9.6 oz (160.8 kg)  06/02/13 (!) 350 lb (158.8 kg)  05/12/13 (!) 353 lb (160.1 kg)  03/19/13 (!) 345 lb (156.5 kg)  02/24/13 (!) 347 lb 6.4 oz (157.6 kg)  02/06/13 (!) 345 lb (156.5 kg)  01/19/13 (!) 343 lb (155.6 kg)  01/14/13 (!) 336 lb (152.4  kg)  01/08/13 (!) 343 lb 12.8 oz (155.9 kg)  12/23/12 (!) 346 lb (156.9 kg)  09/17/12 (!) 329 lb 6.4 oz (149.4 kg)  08/01/12 (!) 333 lb (151 kg)  07/31/12 (!) 333 lb 12.8 oz (151.4 kg)  07/31/12 (!) 319 lb (144.7 kg)  05/15/12 (!) 319 lb (144.7 kg)  01/29/12 (!) 335 lb 6.4 oz (152.1 kg)  12/15/11 (!) 328 lb (148.8 kg)  12/13/10 (!) 367 lb (166.5 kg)  10/12/10 (!) 383 lb (173.7 kg)  09/15/10 (!) 358 lb 1.9 oz (162.4 kg)  08/01/10 (!) 385 lb (174.6 kg)     CURRENT PROBLEM LIST:  Patient Active Problem List   Diagnosis Date Noted   Severe episode of recurrent major depressive disorder, without psychotic features ([place]) 12/05/2021   AKI (acute kidney injury) ([facility]) 11/22/2021   Acute on chronic diastolic (congestive) heart failure (HCC) 11/19/2021   Anxiety 11/10/2021   Breast cancer ([place]) 10/22/2021   Right elbow pain 04/27/2021   Chronic pain of right hand 04/27/2021   Iron deficiency anemia 09/13/2020   Bilateral primary osteoarthritis of knee 04/12/2020   Chronic radicular lumbar pain 03/18/2020   Lumbar spondylosis 03/18/2020   Spinal stenosis, lumbar region, with neurogenic claudication 03/18/2020   Moderate nonproliferative diabetic retinopathy of both eyes without macular edema associated with type 2 diabetes mellitus ([place]) 08/27/2019   Type 2 diabetes mellitus with hyperlipidemia ([place]) 12/13/2017   GERD with esophagitis  11/15/2016   Dementia ([place]) 11/15/2016   Endometrial polyp 06/23/2016   Vitamin D deficiency 08/02/2015   Acute kidney injury superimposed on chronic kidney disease ([place]) 12/18/2014   Gout 10/27/2014   CN (constipation) 05/10/2014   COPD with chronic bronchitis ([place]) 04/21/2014   Estrogen deficiency 02/19/2014   Insomnia 09/17/2012   Long term current use of anticoagulant therapy 02/02/2012   Chronic diastolic congestive heart failure ([place]) 01/29/2012   Class 3 obesity ([place]) 02/15/2009   Microcytic anemia 02/15/2009   TENSION HEADACHE 01/07/2008   Hyperlipidemia 12/03/2006   Essential hypertension 12/03/2006   PAST MEDICAL HISTORY:  Active Ambulatory Problems    Diagnosis Date Noted   Hyperlipidemia 12/03/2006   Class 3 obesity ([place]) 02/15/2009   Microcytic anemia 02/15/2009   TENSION HEADACHE 01/07/2008   Essential hypertension 12/03/2006   Chronic diastolic congestive heart failure ([place]) 01/29/2012   Long term current use of anticoagulant therapy 02/02/2012   Insomnia 09/17/2012   Estrogen deficiency 02/19/2014   COPD with chronic bronchitis ([place]) 04/21/2014   CN (constipation) 05/10/2014   Gout 10/27/2014   Acute kidney injury superimposed on chronic kidney disease ([place]) 12/18/2014   Vitamin D deficiency 08/02/2015   Endometrial polyp 06/23/2016   GERD with esophagitis 11/15/2016   Dementia ([place]) 11/15/2016   Type 2 diabetes mellitus with hyperlipidemia ([place]) 12/13/2017   Moderate nonproliferative diabetic retinopathy of both eyes without macular edema associated with type 2 diabetes mellitus ([place]) 08/27/2019   Chronic radicular lumbar pain 03/18/2020   Lumbar spondylosis 03/18/2020   Spinal stenosis, lumbar region, with neurogenic claudication 03/18/2020   Bilateral primary osteoarthritis of knee 04/12/2020   Iron deficiency anemia 09/13/2020   Right elbow pain 04/27/2021   Chronic pain of right hand 04/27/2021   Breast cancer ([place]) 10/22/2021   Anxiety 11/10/2021    Acute on chronic diastolic (congestive) heart failure ([place]) 11/19/2021   AKI (acute kidney injury) ([place]) 11/22/2021   Severe episode of recurrent major depressive disorder, without psychotic features ([place]) 12/05/2021   Resolved Ambulatory Problems  Diagnosis Date Noted   Fungal dermatitis 43/15/4008   DM w/o Complication Type II 67/61/9509   Situational depression 06/04/2009   RECTAL BLEEDING 01/18/2009   URTICARIA 05/14/2008   Polymyalgia rheumatica ([place]) 01/18/2009   Headache 01/07/2008   Orthopnea 03/09/2009   Personal history of other diseases of the digestive system 12/03/2006   Cholelithiasis 06/06/2010   UTI 06/06/2010   Chest pain, unspecified 09/15/2010   Shortness of breath 10/12/2010   Hypotension 01/26/2012   Abdominal pain 01/26/2012   Elevated troponin 01/26/2012   Chest pain 01/26/2012   History of deep venous thrombosis 01/27/2012   Pulmonary embolus ([place]) 01/28/2012   Dysphagia, unspecified(787.20) 07/31/2012   GERD (gastroesophageal reflux disease) 07/31/2012   Debility 08/01/2012   Osteoarthritis 08/01/2012   DM (diabetes mellitus), type 2, uncontrolled, with renal complications 32/67/1245   Itching 01/08/2013   Need for prophylactic vaccination and inoculation against influenza 01/08/2013   Dysuria 12/31/2013   Pain in the chest 12/31/2013   Breast cancer screening 02/19/2014   Weight gain 02/19/2014   Sepsis secondary to UTI ([place]) 04/21/2014   Fever 04/21/2014   Sepsis ([place]) 04/21/2014   Acute pyelonephritis 04/22/2014   History of pulmonary embolus (PE) 04/22/2014   DOE (dyspnea on exertion) 06/24/2014   Abnormal stress test 09/27/2014   Preoperative clearance 08/16/2014   Chronic bronchitis ([place]) 09/23/2014   Vertigo 09/24/2014   Encounter for therapeutic drug monitoring 10/08/2014   Breast mass 10/27/2014   Morbid obesity with BMI of 50.0-59.9, adult ([place]) 12/18/2014   Chronic pain syndrome 02/06/2015   Cough variant asthma 11/17/2015    Vaginitis and vulvovaginitis 06/23/2016   Esophageal dysphagia 11/07/2016   Multiple benign nevi 11/15/2016   Anxiety associated with depression 06/08/2017   Hyperkalemia 07/26/2017   Acute on chronic diastolic CHF (congestive heart failure) ([place]) 10/08/2017   CHF (congestive heart failure) ([place]) 10/08/2017   Postmenopausal bleeding 06/04/2014   Pneumonia due to COVID-19 virus 01/25/2019   Acute respiratory failure with hypoxia ([place]) 01/29/2019   Pulmonary embolus ([place]) 07/15/2019   Follow-up examination after eye surgery 08/27/2019   Right epiretinal membrane 08/27/2019   Vitreomacular adhesion of right eye 08/27/2019   Diabetes mellitus without complication ([place]) 80/99/8338   Intermediate stage nonexudative age-related macular degeneration of both eyes 08/27/2019   Chest pain on exertion 02/14/2020   Non-STEMI (non-ST elevated myocardial infarction) ([place]) 02/15/2020   Presence of stent in right coronary artery    Pain management contract signed 04/12/2020   Syncope, vasovagal 07/04/2021   Syncope 07/04/2021   Acute on chronic heart failure with preserved ejection fraction (HFpEF) ([place]) 10/13/2021   Class 3 obesity ([place]) 10/22/2021   Past Medical History:  Diagnosis Date   Allergy    Anemia, unspecified    Arthritis    Chronic diastolic CHF (congestive heart failure) (HCC)    CKD (chronic kidney disease), stage III ([place])    Coronary artery disease    Depression    Diabetes mellitus    Gout, unspecified    History of blood clots    Hypertension    Joint pain    Joint swelling    Morbid obesity ([place])    Myocardial infarction ([place]) 2004   Numbness    Obstructive sleep apnea    Osteoarthrosis, unspecified whether generalized or localized, lower leg    Pain, chronic    Pulmonary emboli ([place]) 01/2012   Urinary incontinence    Wears dentures    SOCIAL HX:  Social History   Tobacco  Use   Smoking status: Never    Passive exposure: Past   Smokeless tobacco: Never    Tobacco comments:    Both parents smoked, patient was exposed/ "Raised up in smoke, my whole life."  Substance Use Topics   Alcohol use: No    Alcohol/week: 0.0 standard drinks of alcohol   FAMILY HX:  Family History  Problem Relation Age of Onset   Breast cancer Mother 77   Heart disease Mother    Throat cancer Father    Hypertension Father    Arthritis Father    Diabetes Father    Arthritis Sister    Obesity Sister    Diabetes Sister    Heart disease Cousin    Colon cancer Neg Hx    Stomach cancer Neg Hx    Esophageal cancer Neg Hx       ALLERGIES:  Allergies  Allergen Reactions   Sulfonamide Derivatives Swelling and Other (See Comments)    Mouth swelling- no resp issues noted   Lokelma [Sodium Zirconium Cyclosilicate] Other (See Comments)    Headache, stomach upset   Aricept [Donepezil Hcl] Diarrhea and Nausea And Vomiting    GI upset/loose stools   Tramadol Nausea And Vomiting      PERTINENT MEDICATIONS:  Outpatient Encounter Medications as of 01/16/2022  Medication Sig   albuterol (PROAIR HFA) 108 (90 Base) MCG/ACT inhaler Inhale 1-2 puffs into the lungs every 6 (six) hours as needed for wheezing or shortness of breath.   anastrozole (ARIMIDEX) 1 MG tablet TAKE 1 TABLET BY MOUTH EVERY DAY   ASPIRIN LOW DOSE 81 MG EC tablet TAKE 1 TABLET BY MOUTH EVERY DAY (Patient taking differently: Take 81 mg by mouth daily.)   atorvastatin (LIPITOR) 80 MG tablet TAKE 1 TABLET BY MOUTH EVERY DAY   budesonide-formoterol (SYMBICORT) 160-4.5 MCG/ACT inhaler Inhale 2 puffs into the lungs 2 (two) times daily. (Patient taking differently: Inhale 2 puffs into the lungs 2 (two) times daily as needed (shortness of breath/wheezing).)   carvedilol (COREG) 12.5 MG tablet TAKE 1 TABLET BY MOUTH 2 TIMES DAILY   cetirizine (ZYRTEC) 10 MG tablet Take 10 mg by mouth daily as needed for allergies or rhinitis.   clopidogrel (PLAVIX) 75 MG tablet Take 1 tablet (75 mg total) by mouth daily.    Continuous Blood Gluc Sensor (FREESTYLE LIBRE 14 DAY SENSOR) MISC PLACE 1 DEVICE ON THE SKIN AS DIRECTED EVERY 14 DAYS   diclofenac Sodium (VOLTAREN) 1 % GEL Apply 2 g topically 4 (four) times daily. (Patient taking differently: Apply 1 Application topically 4 (four) times daily as needed (joint pain).)   escitalopram (LEXAPRO) 20 MG tablet Take 1 tablet (20 mg total) by mouth daily. Take 1/2 tablet in 2 weeks, then go to whole tablet   escitalopram (LEXAPRO) 20 MG tablet Take 1 tablet (20 mg total) by mouth daily.   febuxostat (ULORIC) 40 MG tablet TAKE 1 TABLET BY MOUTH EVERY DAY (Patient taking differently: Take 40 mg by mouth daily.)   fluticasone (FLONASE) 50 MCG/ACT nasal spray Place 1 spray into both nostrils in the morning and at bedtime. (Patient taking differently: Place 1 spray into both nostrils 2 (two) times daily as needed for allergies or rhinitis.)   folic acid (FOLVITE) 1 MG tablet TAKE 1 TABLET BY MOUTH EVERY DAY   guaifenesin (ROBITUSSIN) 100 MG/5ML syrup Take 200 mg by mouth 3 (three) times daily as needed for cough.   hydrOXYzine (ATARAX) 25 MG tablet Take 1-2 tablets (25-50 mg  total) by mouth every 8 (eight) hours as needed for anxiety or itching.   insulin aspart (NOVOLOG FLEXPEN) 100 UNIT/ML FlexPen Inject 14 Units into the skin 3 (three) times daily before meals.   insulin glargine, 2 Unit Dial, (TOUJEO MAX SOLOSTAR) 300 UNIT/ML Solostar Pen Inject 42 Units into the skin daily. Patient assistance program provides (Patient taking differently: Inject 42 Units into the skin every morning. Patient assistance program provides)   linaclotide (LINZESS) 145 MCG CAPS capsule Take 1 capsule (145 mcg total) by mouth daily before breakfast. (Patient taking differently: Take 145 mcg by mouth daily as needed (constipation).)   meclizine (ANTIVERT) 25 MG tablet TAKE 1 TABLET BY MOUTH THREE TIMES DAILY AS NEEDED FOR DIZZINESS (Patient taking differently: Take 25 mg by mouth 2 (two) times daily  as needed for dizziness.)   memantine (NAMENDA) 5 MG tablet TAKE 2 TABLETS BY MOUTH EVERY DAY and TAKE 1 TABLET BY MOUTH EVERY EVENING (Patient taking differently: Take 5-10 mg by mouth See admin instructions. Take 2 tablets (10 mg) by mouth every morning and 1 tablet (5 mg) every evening)   nitroGLYCERIN (NITROSTAT) 0.4 MG SL tablet Take one tablet under the tongue every 5 minutes as needed for chest pain (Patient taking differently: Place 0.4 mg under the tongue every 5 (five) minutes as needed for chest pain.)   nystatin cream (MYCOSTATIN) Apply 1 application topically 2 (two) times daily. (Patient taking differently: Apply 1 application  topically 2 (two) times daily as needed for dry skin.)   OVER THE COUNTER MEDICATION Take 1 capsule by mouth every other day. Over the counter stool softener - unknown type   oxyCODONE-acetaminophen (PERCOCET) 7.5-325 MG tablet Take 1 tablet by mouth every 12 (twelve) hours as needed for moderate pain or severe pain. Must last 30 days.   [START ON 01/27/2022] oxyCODONE-acetaminophen (PERCOCET) 7.5-325 MG tablet Take 1 tablet by mouth every 12 (twelve) hours as needed for moderate pain or severe pain. Must last 30 days.   [START ON 02/26/2022] oxyCODONE-acetaminophen (PERCOCET) 7.5-325 MG tablet Take 1 tablet by mouth every 12 (twelve) hours as needed for moderate pain or severe pain. Must last 30 days.   pantoprazole (PROTONIX) 20 MG tablet TAKE 1 TABLET BY MOUTH EVERY DAY   sodium polystyrene (SPS) 15 GM/60ML suspension take 49ms by MOUTH ONCE WEEKLY. TO keep potassium down   torsemide (DEMADEX) 20 MG tablet TAKE 1 TABLET BY MOUTH EVERY DAY - take an extra dose for weight gain, 3lb in 1 day or 5lbs in 1 week   Vitamin D, Ergocalciferol, (DRISDOL) 1.25 MG (50000 UNIT) CAPS capsule TAKE 1 CAPSULE BY MOUTH every 7 days   No facility-administered encounter medications on file as of 01/16/2022.    Thank you for the opportunity to participate in the care of Ms. Sibert.   The palliative care team will continue to follow. Please call our office at [phone] we can be of additional assistance.   THollace Kinnier DO  COVID-19 PATIENT SCREENING TOOL Asked and negative response unless otherwise noted:  Have you had symptoms of covid, tested positive or been in contact with someone with symptoms/positive test in the past 5-10 days?  NO

## 2022-01-17 ENCOUNTER — Encounter (HOSPITAL_COMMUNITY): Payer: Self-pay

## 2022-01-17 ENCOUNTER — Ambulatory Visit: Payer: HMO | Admitting: Family Medicine

## 2022-01-17 ENCOUNTER — Inpatient Hospital Stay: Payer: HMO | Attending: Hematology | Admitting: Hematology

## 2022-01-17 ENCOUNTER — Inpatient Hospital Stay: Payer: HMO

## 2022-01-17 ENCOUNTER — Encounter: Payer: Self-pay | Admitting: Hematology

## 2022-01-17 ENCOUNTER — Other Ambulatory Visit: Payer: Self-pay

## 2022-01-17 VITALS — BP 111/76 | HR 67 | Temp 97.7°F | Resp 16 | Ht 67.0 in | Wt 309.0 lb

## 2022-01-17 DIAGNOSIS — Z79899 Other long term (current) drug therapy: Secondary | ICD-10-CM | POA: Insufficient documentation

## 2022-01-17 DIAGNOSIS — E559 Vitamin D deficiency, unspecified: Secondary | ICD-10-CM | POA: Insufficient documentation

## 2022-01-17 DIAGNOSIS — Z170 Estrogen receptor positive status [ER+]: Secondary | ICD-10-CM

## 2022-01-17 DIAGNOSIS — E611 Iron deficiency: Secondary | ICD-10-CM | POA: Insufficient documentation

## 2022-01-17 DIAGNOSIS — D649 Anemia, unspecified: Secondary | ICD-10-CM | POA: Insufficient documentation

## 2022-01-17 DIAGNOSIS — D509 Iron deficiency anemia, unspecified: Secondary | ICD-10-CM

## 2022-01-17 DIAGNOSIS — C50812 Malignant neoplasm of overlapping sites of left female breast: Secondary | ICD-10-CM | POA: Diagnosis not present

## 2022-01-17 LAB — CBC WITH DIFFERENTIAL (CANCER CENTER ONLY)
Abs Immature Granulocytes: 0.03 10*3/uL (ref 0.00–0.07)
Basophils Absolute: 0 10*3/uL (ref 0.0–0.1)
Basophils Relative: 0 %
Eosinophils Absolute: 0.4 10*3/uL (ref 0.0–0.5)
Eosinophils Relative: 4 %
HCT: 29.8 % — ABNORMAL LOW (ref 36.0–46.0)
Hemoglobin: 9.2 g/dL — ABNORMAL LOW (ref 12.0–15.0)
Immature Granulocytes: 0 %
Lymphocytes Relative: 22 %
Lymphs Abs: 2.1 10*3/uL (ref 0.7–4.0)
MCH: 28.9 pg (ref 26.0–34.0)
MCHC: 30.9 g/dL (ref 30.0–36.0)
MCV: 93.7 fL (ref 80.0–100.0)
Monocytes Absolute: 0.7 10*3/uL (ref 0.1–1.0)
Monocytes Relative: 7 %
Neutro Abs: 6.3 10*3/uL (ref 1.7–7.7)
Neutrophils Relative %: 67 %
Platelet Count: 251 10*3/uL (ref 150–400)
RBC: 3.18 MIL/uL — ABNORMAL LOW (ref 3.87–5.11)
RDW: 14 % (ref 11.5–15.5)
WBC Count: 9.5 10*3/uL (ref 4.0–10.5)
nRBC: 0 % (ref 0.0–0.2)

## 2022-01-17 LAB — IRON AND IRON BINDING CAPACITY (CC-WL,HP ONLY)
Iron: 43 ug/dL (ref 28–170)
Saturation Ratios: 20 % (ref 10.4–31.8)
TIBC: 217 ug/dL — ABNORMAL LOW (ref 250–450)
UIBC: 174 ug/dL (ref 148–442)

## 2022-01-17 LAB — CMP (CANCER CENTER ONLY)
ALT: 6 U/L (ref 0–44)
AST: 9 U/L — ABNORMAL LOW (ref 15–41)
Albumin: 3.4 g/dL — ABNORMAL LOW (ref 3.5–5.0)
Alkaline Phosphatase: 83 U/L (ref 38–126)
Anion gap: 7 (ref 5–15)
BUN: 66 mg/dL — ABNORMAL HIGH (ref 8–23)
CO2: 30 mmol/L (ref 22–32)
Calcium: 8.8 mg/dL — ABNORMAL LOW (ref 8.9–10.3)
Chloride: 102 mmol/L (ref 98–111)
Creatinine: 2.37 mg/dL — ABNORMAL HIGH (ref 0.44–1.00)
GFR, Estimated: 20 mL/min — ABNORMAL LOW (ref >60)
Glucose, Bld: 186 mg/dL — ABNORMAL HIGH (ref 70–99)
Potassium: 4.6 mmol/L (ref 3.5–5.1)
Sodium: 139 mmol/L (ref 135–145)
Total Bilirubin: 0.4 mg/dL (ref 0.3–1.2)
Total Protein: 7.5 g/dL (ref 6.5–8.1)

## 2022-01-17 LAB — FERRITIN: Ferritin: 245 ng/mL (ref 11–307)

## 2022-01-17 NOTE — Progress Notes (Signed)
.    Hematology/Oncology Clinic Followup Note  Date of service 01/17/2022    Patient Care Team: Bonnita Hollow, MD as PCP - General (Family Medicine) [name], MD as PCP - Cardiology (Cardiology) [name], [name], MD (Inactive) as Consulting Physician (Cardiology) [name], MD as Consulting Physician (Ophthalmology) [name], MD as Consulting Physician (General Surgery) [name], MD (Inactive) as Referring Physician (Obstetrics and Gynecology) [name] [name], MD as Consulting Physician (Ophthalmology) [facility], [name], MD as Consulting Physician (Endocrinology) [name], MD as Consulting Physician (Hematology) [name], DO as Consulting Physician (Neurology)  CHIEF COMPLAINTS/PURPOSE OF CONSULTATION:  Follow-up for continued evaluation and management of breast cancer  DIAGNOSIS: left-sided presumed stage IA  (pT1c,pNx[cN0], cM0) invasive ductal carcinoma grade 2 out of 3, strongly ER +100%, strongly PR +100% and HER-2/neu negative. Given her high risk cardiac status lumpectomy had to be done under local anesthesia and sentinel lymph node biopsy was not possible. She has accompanying DCIS of intermediate grade present at margins. ECOG performance status is 3. -Had a mammogram/tomosynthesis done on 07/29/2015 which shows residual suspicious calcification in the left breast postero-medial to the biopsy site. Has known residual DCIS.  No evidence of new malignancy.  No evidence of right breast malignancy.   CURRENT TREATMENT: Arimidex 1mg  po daily  HISTORY OF PRESENTING ILLNESS: -please see my initial consultation for details of initial presentation   INTERVAL HISTORY: [name] is here for follow-up of her breast cancer on endocrine therapy with Arimidex for continued evaluation and management. She reports She is doing well with no new symptoms or concerns.  She notes she is tolerating her Arimidex well with no prohibitive  toxicities at this time.  She reports that she is not eating well and is quickly satiated. We discussed following up with GI and her PCP for further evaluation and monitoring.  No new lumps, bumps, or lesions/rashes. No new breast pain. No nipple discharge or other breast symptoms. No overt GI bleeding. No other new or acute focal symptoms.  Labs done today were reviewed in detail with the patient.  MEDICAL HISTORY:  Past Medical History:  Diagnosis Date   Abdominal pain 01/26/2012   Acute kidney injury superimposed on chronic kidney disease ([place]) 12/18/2014   Allergy    takes Mucinex daily as needed   Anemia, unspecified    Anxiety    takes Clonazepam daily as needed   Anxiety associated with depression 06/08/2017   Arthritis    Breast cancer ([place])    Breast cancer screening 02/19/2014   Breast mass 10/27/2014   Chronic diastolic CHF (congestive heart failure) ([place])    takes Furosemide daily   Chronic pain syndrome 02/06/2015   CKD (chronic kidney disease), stage III ([place])    Coronary artery disease    a. s/p IMI 2004 tx with BMS to RCA;  b. s/p Promus DES to RCA 2/12 c. abnormal nuc 2016 -> cath with med rx. d. NSTEMI 02/2020  mv CAD with severe ISR in pRCA and m/dRCA lesion both treated with DES.   Debility 08/01/2012   Dementia ([place])    Depression    takes Cymbalta daily   Diabetes mellitus    insulin daily   DOE (dyspnea on exertion) 06/24/2014   Dysphagia, unspecified(787.20) 07/31/2012   Fungal dermatitis 11/13/2007   Qualifier: Diagnosis of  By: Burnice Logan  MD, Doretha Sou    GERD (gastroesophageal reflux disease)    takes Protonix daily   Gout, unspecified  Headache    occasionally   History of blood clots    History of deep venous thrombosis 01/27/2012   History of pulmonary embolus (PE) 04/22/2014   Hyperkalemia 07/26/2017   Hyperlipidemia    takes Pravastatin daily   Hypertension    Insomnia    Joint pain    Joint swelling    Morbid obesity ([place])    Multiple  benign nevi 11/15/2016   Myocardial infarction [facility]) 2004   Non-STEMI (non-ST elevated myocardial infarction) ([place]) 02/15/2020   Numbness    Obstructive sleep apnea    does not wear cpap   Osteoarthritis    Osteoarthritis    Osteoarthrosis, unspecified whether generalized or localized, lower leg    Pain in the chest 12/31/2013   Pain, chronic    Personal history of other diseases of the digestive system 12/03/2006   Qualifier: Diagnosis of  By: Burnice Logan  MD, Doretha Sou    Polymyalgia rheumatica [facility])    Presence of stent in right coronary artery    Pulmonary emboli ([place]) 01/2012   felt to need lifelong anticoagulation but Xarelto dc in 02/2020 due to need for DAPT   Situational depression 06/04/2009   Qualifier: Diagnosis of  By: Burnice Logan  MD, Doretha Sou    Syncope, vasovagal 07/04/2021   Urinary incontinence    takes Linzess daily   Vaginitis and vulvovaginitis 06/23/2016   Vertigo    hx of;was taking Meclizine if needed   Wears dentures     SURGICAL HISTORY: Past Surgical History:  Procedure Laterality Date   ABDOMINAL HYSTERECTOMY     partial   APPENDECTOMY     blood clots/legs and lungs  2013   BREAST BIOPSY Left 07/22/2014   BREAST BIOPSY Left 02/10/2013   BREAST LUMPECTOMY Left 11/05/2014   BREAST LUMPECTOMY WITH RADIOACTIVE SEED LOCALIZATION Left 11/05/2014   Procedure: LEFT BREAST LUMPECTOMY WITH RADIOACTIVE SEED LOCALIZATION;  Surgeon: [name], MD;  Location: [place];  Service: General;  Laterality: Left;   CARDIAC CATHETERIZATION     COLONOSCOPY     CORONARY ANGIOPLASTY  2   CORONARY STENT INTERVENTION N/A 02/17/2020   Procedure: CORONARY STENT INTERVENTION;  Surgeon: Leonie Man, MD;  Location: [place] CV [facility];  Service: Cardiovascular;  Laterality: N/A;   ESOPHAGOGASTRODUODENOSCOPY (EGD) WITH PROPOFOL N/A 11/07/2016   Procedure: ESOPHAGOGASTRODUODENOSCOPY (EGD) WITH PROPOFOL;  Surgeon: Gatha Mayer, MD;  Location: [facility];  Service: Endoscopy;   Laterality: N/A;   EXCISION OF SKIN TAG Right 11/05/2014   Procedure: EXCISION OF RIGHT EYELID SKIN TAG;  Surgeon: [name], MD;  Location: [place];  Service: General;  Laterality: Right;   EYE SURGERY Bilateral    cataract    GASTRIC BYPASS  1977    reversed in 1979, [place] CATH AND CORONARY ANGIOGRAPHY N/A 02/17/2020   Procedure: LEFT HEART CATH AND CORONARY ANGIOGRAPHY;  Surgeon: Leonie Man, MD;  Location: [place] [facility];  Service: Cardiovascular;  Laterality: N/A;   LEFT HEART CATHETERIZATION WITH CORONARY ANGIOGRAM N/A 06/29/2014   Procedure: LEFT HEART CATHETERIZATION WITH CORONARY ANGIOGRAM;  Surgeon: Troy Sine, MD;  Location: [facility] CATH LAB;  Service: Cardiovascular;  Laterality: N/A;   MEMBRANE PEEL Right 10/23/2018   Procedure: MEMBRANE PEEL;  Surgeon: Hurman Horn, MD;  Location: [place];  Service: Ophthalmology;  Laterality: Right;   MI with stent placement  2004   PARS PLANA VITRECTOMY Right 10/23/2018   Procedure: PARS PLANA VITRECTOMY WITH 25  GAUGE;  Surgeon: Hurman Horn, MD;  Location: [place];  Service: Ophthalmology;  Laterality: Right;    SOCIAL HISTORY: Social History   Socioeconomic History   Marital status: Widowed    Spouse name: Not on file   Number of children: 3   Years of education: Not on file   Highest education level: High school graduate  Occupational History   Occupation: retired  Tobacco Use   Smoking status: Never    Passive exposure: Past   Smokeless tobacco: Never   Tobacco comments:    Both parents smoked, patient was exposed/ "Raised up in smoke, my whole life."  Vaping Use   Vaping Use: Never used  Substance and Sexual Activity   Alcohol use: No    Alcohol/week: 0.0 standard drinks of alcohol   Drug use: No   Sexual activity: Not Currently  Other Topics Concern   Not on file  Social History Narrative   Not on file   Social Determinants of Health   Financial Resource Strain: Low Risk  (10/14/2021)    Overall Financial Resource Strain (CARDIA)    Difficulty of Paying Living Expenses: Not very hard  Food Insecurity: No Food Insecurity (10/14/2021)   Hunger Vital Sign    Worried About Running Out of Food in the Last Year: Never true    Ran Out of Food in the Last Year: Never true  Transportation Needs: No Transportation Needs (10/14/2021)   PRAPARE - Hydrologist (Medical): No    Lack of Transportation (Non-Medical): No  Physical Activity: Inactive (07/26/2017)   Exercise Vital Sign    Days of Exercise per Week: 0 days    Minutes of Exercise per Session: 0 min  Stress: Stress Concern Present (07/26/2017)   [place]    Feeling of Stress : Rather much  Social Connections: Moderately Integrated (07/26/2017)   Social Connection and Isolation Panel [NHANES]    Frequency of Communication with Friends and Family: Once a week    Frequency of Social Gatherings with Friends and Family: Three times a week    Attends Religious Services: 1 to 4 times per year    Active Member of Clubs or Organizations: Yes    Attends Archivist Meetings: 1 to 4 times per year    Marital Status: Widowed  Intimate Partner Violence: Not At Risk (07/26/2017)   Humiliation, Afraid, Rape, and Kick questionnaire    Fear of Current or Ex-Partner: No    Emotionally Abused: No    Physically Abused: No    Sexually Abused: No    FAMILY HISTORY: Family History  Problem Relation Age of Onset   Breast cancer Mother 51   Heart disease Mother    Throat cancer Father    Hypertension Father    Arthritis Father    Diabetes Father    Arthritis Sister    Obesity Sister    Diabetes Sister    Heart disease Cousin    Colon cancer Neg Hx    Stomach cancer Neg Hx    Esophageal cancer Neg Hx     ALLERGIES:  is allergic to sulfonamide derivatives, lokelma [sodium zirconium cyclosilicate], aricept [donepezil hcl], and  tramadol.  MEDICATIONS:  Current Outpatient Medications  Medication Sig Dispense Refill   albuterol (PROAIR HFA) 108 (90 Base) MCG/ACT inhaler Inhale 1-2 puffs into the lungs every 6 (six) hours as needed for wheezing or shortness of breath. 6.7 g  1   anastrozole (ARIMIDEX) 1 MG tablet TAKE 1 TABLET BY MOUTH EVERY DAY 28 tablet 1   ASPIRIN LOW DOSE 81 MG EC tablet TAKE 1 TABLET BY MOUTH EVERY DAY (Patient taking differently: Take 81 mg by mouth daily.) 30 tablet 11   atorvastatin (LIPITOR) 80 MG tablet TAKE 1 TABLET BY MOUTH EVERY DAY 90 tablet 3   budesonide-formoterol (SYMBICORT) 160-4.5 MCG/ACT inhaler Inhale 2 puffs into the lungs 2 (two) times daily. (Patient taking differently: Inhale 2 puffs into the lungs 2 (two) times daily as needed (shortness of breath/wheezing).) 1 each 3   carvedilol (COREG) 12.5 MG tablet TAKE 1 TABLET BY MOUTH 2 TIMES DAILY 180 tablet 1   cetirizine (ZYRTEC) 10 MG tablet Take 10 mg by mouth daily as needed for allergies or rhinitis.     clopidogrel (PLAVIX) 75 MG tablet Take 1 tablet (75 mg total) by mouth daily. 90 tablet 3   Continuous Blood Gluc Sensor (FREESTYLE LIBRE 14 DAY SENSOR) MISC PLACE 1 DEVICE ON THE SKIN AS DIRECTED EVERY 14 DAYS 6 each 0   diclofenac Sodium (VOLTAREN) 1 % GEL Apply 2 g topically 4 (four) times daily. (Patient taking differently: Apply 1 Application topically 4 (four) times daily as needed (joint pain).) 150 g 2   escitalopram (LEXAPRO) 20 MG tablet Take 1 tablet (20 mg total) by mouth daily. Take 1/2 tablet in 2 weeks, then go to whole tablet 90 tablet 0   escitalopram (LEXAPRO) 20 MG tablet Take 1 tablet (20 mg total) by mouth daily. 90 tablet 0   febuxostat (ULORIC) 40 MG tablet TAKE 1 TABLET BY MOUTH EVERY DAY (Patient taking differently: Take 40 mg by mouth daily.) 30 tablet 5   fluticasone (FLONASE) 50 MCG/ACT nasal spray Place 1 spray into both nostrils in the morning and at bedtime. (Patient taking differently: Place 1 spray  into both nostrils 2 (two) times daily as needed for allergies or rhinitis.) 26.3 mL 3   folic acid (FOLVITE) 1 MG tablet TAKE 1 TABLET BY MOUTH EVERY DAY 90 tablet 1   guaifenesin (ROBITUSSIN) 100 MG/5ML syrup Take 200 mg by mouth 3 (three) times daily as needed for cough.     hydrOXYzine (ATARAX) 25 MG tablet Take 1-2 tablets (25-50 mg total) by mouth every 8 (eight) hours as needed for anxiety or itching. 120 tablet 0   insulin aspart (NOVOLOG FLEXPEN) 100 UNIT/ML FlexPen Inject 14 Units into the skin 3 (three) times daily before meals.     insulin glargine, 2 Unit Dial, (TOUJEO MAX SOLOSTAR) 300 UNIT/ML Solostar Pen Inject 42 Units into the skin daily. Patient assistance program provides (Patient taking differently: Inject 42 Units into the skin every morning. Patient assistance program provides) 15 mL 6   linaclotide (LINZESS) 145 MCG CAPS capsule Take 1 capsule (145 mcg total) by mouth daily before breakfast. (Patient taking differently: Take 145 mcg by mouth daily as needed (constipation).) 30 capsule 2   meclizine (ANTIVERT) 25 MG tablet TAKE 1 TABLET BY MOUTH THREE TIMES DAILY AS NEEDED FOR DIZZINESS (Patient taking differently: Take 25 mg by mouth 2 (two) times daily as needed for dizziness.) 30 tablet 11   memantine (NAMENDA) 5 MG tablet TAKE 2 TABLETS BY MOUTH EVERY DAY and TAKE 1 TABLET BY MOUTH EVERY EVENING (Patient taking differently: Take 5-10 mg by mouth See admin instructions. Take 2 tablets (10 mg) by mouth every morning and 1 tablet (5 mg) every evening) 270 tablet 5   nitroGLYCERIN (NITROSTAT) 0.4  MG SL tablet Take one tablet under the tongue every 5 minutes as needed for chest pain (Patient taking differently: Place 0.4 mg under the tongue every 5 (five) minutes as needed for chest pain.) 50 tablet 0   nystatin cream (MYCOSTATIN) Apply 1 application topically 2 (two) times daily. (Patient taking differently: Apply 1 application  topically 2 (two) times daily as needed for dry skin.)  30 g 0   OVER THE COUNTER MEDICATION Take 1 capsule by mouth every other day. Over the counter stool softener - unknown type     oxyCODONE-acetaminophen (PERCOCET) 7.5-325 MG tablet Take 1 tablet by mouth every 12 (twelve) hours as needed for moderate pain or severe pain. Must last 30 days. 60 tablet 0   [START ON 01/27/2022] oxyCODONE-acetaminophen (PERCOCET) 7.5-325 MG tablet Take 1 tablet by mouth every 12 (twelve) hours as needed for moderate pain or severe pain. Must last 30 days. 60 tablet 0   [START ON 02/26/2022] oxyCODONE-acetaminophen (PERCOCET) 7.5-325 MG tablet Take 1 tablet by mouth every 12 (twelve) hours as needed for moderate pain or severe pain. Must last 30 days. 60 tablet 0   pantoprazole (PROTONIX) 20 MG tablet TAKE 1 TABLET BY MOUTH EVERY DAY 30 tablet 5   sodium polystyrene (SPS) 15 GM/60ML suspension take 45ms by MOUTH ONCE WEEKLY. TO keep potassium down 240 mL 2   torsemide (DEMADEX) 20 MG tablet TAKE 1 TABLET BY MOUTH EVERY DAY - take an extra dose for weight gain, 3lb in 1 day or 5lbs in 1 week 30 tablet 0   Vitamin D, Ergocalciferol, (DRISDOL) 1.25 MG (50000 UNIT) CAPS capsule TAKE 1 CAPSULE BY MOUTH every 7 days 4 capsule 1   No current facility-administered medications for this visit.    REVIEW OF SYSTEMS:   10 Point review of Systems was done is negative except as noted above.   PHYSICAL EXAMINATION:  ECOG PERFORMANCE STATUS:3  Vitals:   01/17/22 1358  BP: 111/76  Pulse: 67  Resp: 16  Temp: 97.7 F (36.5 C)  SpO2: 97%   Filed Weights   01/17/22 1358  Weight: (!) 309 lb (140.2 kg)    Exam was given in a wheelchair.   NAD GENERAL:alert, in no acute distress and comfortable SKIN: no acute rashes, no significant lesions EYES: conjunctiva are pink and non-injected, sclera anicteric NECK: supple, no JVD LYMPH:  no palpable lymphadenopathy in the cervical, axillary or inguinal regions LUNGS: clear to auscultation b/l with normal respiratory  effort HEART: regular rate & rhythm ABDOMEN:  normoactive bowel sounds , non tender, not distended. Extremity: no pedal edema PSYCH: alert & oriented x 3 with fluent speech NEURO: no focal motor/sensory deficits  LABORATORY DATA:  I have reviewed the data as listed  .    Latest Ref Rng & Units 01/17/2022    1:40 PM 11/26/2021    2:59 AM 11/24/2021   12:51 AM  CBC  WBC 4.0 - 10.5 K/uL 9.5  11.4  13.2   Hemoglobin 12.0 - 15.0 g/dL 9.2  8.8  8.5   Hematocrit 36.0 - 46.0 % 29.8  28.1  27.1   Platelets 150 - 400 K/uL 251  209  177    . CBC    Component Value Date/Time   WBC 9.5 01/17/2022 1340   WBC 11.4 (H) 11/26/2021 0259   RBC 3.18 (L) 01/17/2022 1340   HGB 9.2 (L) 01/17/2022 1340   HGB 9.3 (L) 03/02/2020 1517   HGB 8.7 (L) 03/21/2017 1351  HCT 29.8 (L) 01/17/2022 1340   HCT 29.5 (L) 03/02/2020 1517   HCT 28.9 (L) 03/21/2017 1351   PLT 251 01/17/2022 1340   PLT 254 03/02/2020 1517   MCV 93.7 01/17/2022 1340   MCV 88 03/02/2020 1517   MCV 84.3 03/21/2017 1351   MCH 28.9 01/17/2022 1340   MCHC 30.9 01/17/2022 1340   RDW 14.0 01/17/2022 1340   RDW 13.1 03/02/2020 1517   RDW 15.5 (H) 03/21/2017 1351   LYMPHSABS 2.1 01/17/2022 1340   LYMPHSABS 2.4 03/21/2017 1351   MONOABS 0.7 01/17/2022 1340   MONOABS 0.7 03/21/2017 1351   EOSABS 0.4 01/17/2022 1340   EOSABS 0.2 03/21/2017 1351   BASOSABS 0.0 01/17/2022 1340   BASOSABS 0.0 03/21/2017 1351   .    Latest Ref Rng & Units 01/17/2022    1:40 PM 12/05/2021    2:34 PM 11/26/2021    2:59 AM  CMP  Glucose 70 - 99 mg/dL 186  186  183   BUN 8 - 23 mg/dL 66  57  76   Creatinine 0.44 - 1.00 mg/dL 2.37  1.90  2.12   Sodium 135 - 145 mmol/L 139  136  134   Potassium 3.5 - 5.1 mmol/L 4.6  5.6  4.7   Chloride 98 - 111 mmol/L 102  102  101   CO2 22 - 32 mmol/L _0 Calcium 8.9 - 10.3 mg/dL 8.8  9.1  8.4   Total Protein 6.5 - 8.1 g/dL 7.5     Total Bilirubin 0.3 - 1.2 mg/dL 0.4     Alkaline Phos 38 - 126 U/L 83      AST 15 - 41 U/L 9     ALT 0 - 44 U/L 6       . Lab Results  Component Value Date   IRON 43 01/17/2022   TIBC 217 (L) 01/17/2022   IRONPCTSAT 20 01/17/2022   (Iron and TIBC)  Lab Results  Component Value Date   FERRITIN 245 01/17/2022       RADIOGRAPHIC STUDIES: I have personally reviewed the radiological images as listed and agreed with the findings in the report.  No results found. Diagnostic mammogram b/l 11/01/17  IMPRESSION: No significant change in suspicious microcalcifications posterior to the lumpectomy site in the left breast compared to the mammogram of 2018. No suspicious findings in the right breast. Exclusion of some posterior tissue due to patient decreased mobility.   RECOMMENDATION: Diagnostic mammogram is suggested in 1 year. (Code:[id number]-B-01Y)  CLINICAL DATA:  Annual mammography. The patient had surgery for DCIS in the left breast in 2016. There were positive margins at surgery but the patient could not return for additional surgery. Known suspicious calcifications remain. EXAM: 2D DIGITAL DIAGNOSTIC BILATERAL MAMMOGRAM WITH CAD AND ADJUNCT TOMO COMPARISON:  Previous exam(s). ACR Breast Density Category b: There are scattered areas of fibroglandular density. FINDINGS: The suspicious calcifications posterior to the left lumpectomy site are stable. No other interval changes or other suspicious findings. Mammographic images were processed with CAD. IMPRESSION: Continued suspicious calcifications posterior to the left lumpectomy site. No other changes. RECOMMENDATION: Continued annual mammography for surveillance. Continued oncologic follow up. I have discussed the findings and recommendations with the patient. Results were also provided in writing at the conclusion of the visit. If applicable, a reminder letter will be sent to the patient regarding the next appointment. BI-RADS CATEGORY  6: Known biopsy-proven malignancy.     Electronically  Signed  By: Dorise Bullion III M.D   On: 08/16/2016 15:21    ASSESSMENT & PLAN:   Mrs. Peckenpaugh is a very wonderful 87 y.o. African-American female with multiple medical comorbidities as described above with   #1 Left-sided presumed stage IA  (pT1c,pNx[cN0], cM0) invasive ductal carcinoma grade 2 out of 3, strongly ER +100%, strongly PR +100% and HER-2/neu negative. Given her high risk cardiac status lumpectomy had to be done under local anesthesia and sentinel lymph node biopsy was not possible. She has accompanying DCIS of intermediate grade present at margins. ECOG performance status is 3. Dexa scan "low bone mass" per WHO. -Bone density scan on 03/12/2017 with results showing: T-score of 0.5 at left forearm. Lumbar spine and dual femurs not completed due to patient in a wheelchair and not able to lay flat due to SOB while laying flat.   #2 significant coronary artery disease with stress test in February 2016 suggesting likely multivessel disease. Has uncontrolled diabetes, hypertension, dyslipidemia, untreated sleep apnea, coronary disease, severe arthritis all of which are significantly limiting her quality of life.  #3 vitamin D deficiency - 25OH vitamin D level of 10, s/p high dose ergocalciferol re-placement with 25OH Vit D improvement to 30.9 in 10/2017. Now on vit D 2000 units daily.   #4  Anemia- anemia of chronic disease from CKD + ? Slow GI losses . Multiple metabolic insults in the bone marrow including uncontrolled diabetes . PLAN: -Labs done today were reviewed in detail. CBC and CMP stable. Ferritin is 240. Iron sat ratio is 20%. -Should has no clinical or mammographic evidence of breast cancer progression at this time. -Mammogram on 06/21/2021 showed no evidence of visible breast cancer. -No notable toxicities from her current dose of Arimidex -Continue Arimidex 1 mg p.o. daily -Bone density study scheduled for 10/17/2021. -Continue high-dose vitamin D as ergocalciferol and  also vitamin B12 We will schedule her for 2 additional doses of IV iron to try to keep her iron saturation closer to 30% and ferritin more than 250 in the context of CKD -We discussed following up with GI and her PCP for further evaluation of symptoms consistent with suspected possible gastroparesis.   FOLLOW UP: Mammogram in 06/2022 RTC with Dr Irene Limbo with labs in 07/2022  The total time spent in the appointment was 21 minutes*.  All of the patient's questions were answered with apparent satisfaction. The patient knows to call the clinic with any problems, questions or concerns.   Sullivan Lone MD MS AAHIVMS [facility] Hematology/Oncology Physician [facility]  .*Total Encounter Time as defined by the Centers for Medicare and Medicaid Services includes, in addition to the face-to-face time of a patient visit (documented in the note above) non-face-to-face time: obtaining and reviewing outside history, ordering and reviewing medications, tests or procedures, care coordination (communications with other health care professionals or caregivers) and documentation in the medical record.  I, Melene Muller, am acting as scribe for Dr. Sullivan Lone, MD.  .I have reviewed the above documentation for accuracy and completeness, and I agree with the above.[name] MD

## 2022-01-23 ENCOUNTER — Encounter (HOSPITAL_COMMUNITY): Payer: Self-pay

## 2022-01-23 ENCOUNTER — Encounter: Payer: Self-pay | Admitting: Hematology

## 2022-02-01 ENCOUNTER — Other Ambulatory Visit: Payer: Self-pay | Admitting: Family

## 2022-02-01 ENCOUNTER — Other Ambulatory Visit: Payer: Self-pay | Admitting: Nurse Practitioner

## 2022-02-01 ENCOUNTER — Telehealth: Payer: Self-pay | Admitting: Family Medicine

## 2022-02-01 DIAGNOSIS — R0981 Nasal congestion: Secondary | ICD-10-CM

## 2022-02-01 DIAGNOSIS — J449 Chronic obstructive pulmonary disease, unspecified: Secondary | ICD-10-CM

## 2022-02-01 NOTE — Telephone Encounter (Signed)
Pt daughter called stated her mom stay cold all the time and want to know if it's from her medication

## 2022-02-02 DIAGNOSIS — N183 Chronic kidney disease, stage 3 unspecified: Secondary | ICD-10-CM | POA: Diagnosis not present

## 2022-02-02 DIAGNOSIS — I129 Hypertensive chronic kidney disease with stage 1 through stage 4 chronic kidney disease, or unspecified chronic kidney disease: Secondary | ICD-10-CM | POA: Diagnosis not present

## 2022-02-02 DIAGNOSIS — I2510 Atherosclerotic heart disease of native coronary artery without angina pectoris: Secondary | ICD-10-CM | POA: Diagnosis not present

## 2022-02-02 DIAGNOSIS — N2581 Secondary hyperparathyroidism of renal origin: Secondary | ICD-10-CM | POA: Diagnosis not present

## 2022-02-02 DIAGNOSIS — E1122 Type 2 diabetes mellitus with diabetic chronic kidney disease: Secondary | ICD-10-CM | POA: Diagnosis not present

## 2022-02-02 DIAGNOSIS — D631 Anemia in chronic kidney disease: Secondary | ICD-10-CM | POA: Diagnosis not present

## 2022-02-02 NOTE — Telephone Encounter (Signed)
Spoke with patient and daughter [name]. Patient stated that she feels better now and will call back if anything changes.

## 2022-02-02 NOTE — Progress Notes (Unsigned)
Cardiology Office Note:    Date:  02/02/2022   ID:  [name], DOB 11/01/1934, MRN [id number]  PCP:  Bonnita Hollow, MD   [facility] Providers Cardiologist:  Ena Dawley, MD {   Referring MD: [name], NP    History of Present Illness:    [name] is a 87 y.o. female with a hx of dementia, CAD (BMS to RCA 2004, DESx2 to mid and proximal RCA 2012, recent NSTEMI 02/2020 s/p DES to RCA), chronic diastolic CHF, DM, morbid obesity, remote gastric bypass, PMR, HLD, depression, OCD, anxiety, COPD, prior PE/DVT, CKD stage III, breast CA who presents to clinic for follow-up.  Per review of the record, the patient has history of multiple medical problems as outlined above. She was admitted 02/2020 with chest pain/NSTEMI and underwent cath showing multivessel CAD but severe ISR in pRCA and m/dRCA lesion both treated with DES. There was diffuse mild to moderate disease in the LM as well as Lcx and LAD but nonobstructive and treated medically. LVEF was normal, borderline elevated LVEDP. 2D echo was technically difficult given habitus. Hospital course also notable for anemia requiring blood transfusion (has prior h/o anemia). IM discussed case with Dr. Irene Limbo, her oncologist, regarding ongoing maintenance Xarelto for prior h/o PE/DVT and the mutual decision was made to hold further Xarelto given need for DAPT and history of anemia.   Was seen in 02/2020 with [name] where she was tachycardic with elevated D-dimer. V/Q scan obtained without evidence of PE.  Was admitted 10/13/21 with acute on chronic HFpEF. Diuresed with IV lasix and later transitioned to lasix 40 mg po daily. Discharge weight 315 pounds. Discharged 10/22/21.   Was seen in clinic by [name], NP on 10/25/21 where she was wheelchair bound and required assistance with all ADLs. She was continued on lasix '40mg'$  daily with extra dose as needed.  Was last seen in clinic on 12/30/21 where she was feeling okay.  Continued to have SOB with activity. Required assistance with ADLs.   Today, the patient states she overall   Past Medical History:  Diagnosis Date   Abdominal pain 01/26/2012   Acute kidney injury superimposed on chronic kidney disease ([place]) 12/18/2014   Allergy    takes Mucinex daily as needed   Anemia, unspecified    Anxiety    takes Clonazepam daily as needed   Anxiety associated with depression 06/08/2017   Arthritis    Breast cancer ([place])    Breast cancer screening 02/19/2014   Breast mass 10/27/2014   Chronic diastolic CHF (congestive heart failure) ([place])    takes Furosemide daily   Chronic pain syndrome 02/06/2015   CKD (chronic kidney disease), stage III ([place])    Coronary artery disease    a. s/p IMI 2004 tx with BMS to RCA;  b. s/p Promus DES to RCA 2/12 c. abnormal nuc 2016 -> cath with med rx. d. NSTEMI 02/2020  mv CAD with severe ISR in pRCA and m/dRCA lesion both treated with DES.   Debility 08/01/2012   Dementia ([place])    Depression    takes Cymbalta daily   Diabetes mellitus    insulin daily   DOE (dyspnea on exertion) 06/24/2014   Dysphagia, unspecified(787.20) 07/31/2012   Fungal dermatitis 11/13/2007   Qualifier: Diagnosis of  By: Burnice Logan  MD, Doretha Sou    GERD (gastroesophageal reflux disease)    takes Protonix daily   Gout, unspecified    Headache    occasionally  History of blood clots    History of deep venous thrombosis 01/27/2012   History of pulmonary embolus (PE) 04/22/2014   Hyperkalemia 07/26/2017   Hyperlipidemia    takes Pravastatin daily   Hypertension    Insomnia    Joint pain    Joint swelling    Morbid obesity ([place])    Multiple benign nevi 11/15/2016   Myocardial infarction [facility]) 2004   Non-STEMI (non-ST elevated myocardial infarction) ([place]) 02/15/2020   Numbness    Obstructive sleep apnea    does not wear cpap   Osteoarthritis    Osteoarthritis    Osteoarthrosis, unspecified whether generalized or localized, lower leg    Pain in the  chest 12/31/2013   Pain, chronic    Personal history of other diseases of the digestive system 12/03/2006   Qualifier: Diagnosis of  By: Burnice Logan  MD, Doretha Sou    Polymyalgia rheumatica [facility])    Presence of stent in right coronary artery    Pulmonary emboli ([place]) 01/2012   felt to need lifelong anticoagulation but Xarelto dc in 02/2020 due to need for DAPT   Situational depression 06/04/2009   Qualifier: Diagnosis of  By: Burnice Logan  MD, Doretha Sou    Syncope, vasovagal 07/04/2021   Urinary incontinence    takes Linzess daily   Vaginitis and vulvovaginitis 06/23/2016   Vertigo    hx of;was taking Meclizine if needed   Wears dentures     Past Surgical History:  Procedure Laterality Date   ABDOMINAL HYSTERECTOMY     partial   APPENDECTOMY     blood clots/legs and lungs  2013   BREAST BIOPSY Left 07/22/2014   BREAST BIOPSY Left 02/10/2013   BREAST LUMPECTOMY Left 11/05/2014   BREAST LUMPECTOMY WITH RADIOACTIVE SEED LOCALIZATION Left 11/05/2014   Procedure: LEFT BREAST LUMPECTOMY WITH RADIOACTIVE SEED LOCALIZATION;  Surgeon: [name], MD;  Location: [place];  Service: General;  Laterality: Left;   CARDIAC CATHETERIZATION     COLONOSCOPY     CORONARY ANGIOPLASTY  2   CORONARY STENT INTERVENTION N/A 02/17/2020   Procedure: CORONARY STENT INTERVENTION;  Surgeon: Leonie Man, MD;  Location: [place] CV LAB;  Service: Cardiovascular;  Laterality: N/A;   ESOPHAGOGASTRODUODENOSCOPY (EGD) WITH PROPOFOL N/A 11/07/2016   Procedure: ESOPHAGOGASTRODUODENOSCOPY (EGD) WITH PROPOFOL;  Surgeon: Gatha Mayer, MD;  Location: [facility];  Service: Endoscopy;  Laterality: N/A;   EXCISION OF SKIN TAG Right 11/05/2014   Procedure: EXCISION OF RIGHT EYELID SKIN TAG;  Surgeon: [name], MD;  Location: [place];  Service: General;  Laterality: Right;   EYE SURGERY Bilateral    cataract    GASTRIC BYPASS  1977    reversed in 1979, [place] CATH AND CORONARY ANGIOGRAPHY  N/A 02/17/2020   Procedure: LEFT HEART CATH AND CORONARY ANGIOGRAPHY;  Surgeon: Leonie Man, MD;  Location: [place] CV LAB;  Service: Cardiovascular;  Laterality: N/A;   LEFT HEART CATHETERIZATION WITH CORONARY ANGIOGRAM N/A 06/29/2014   Procedure: LEFT HEART CATHETERIZATION WITH CORONARY ANGIOGRAM;  Surgeon: Troy Sine, MD;  Location: [facility] C[facility] LAB;  Service: Cardiovascular;  Laterality: N/A;   MEMBRANE PEEL Right 10/23/2018   Procedure: MEMBRANE PEEL;  Surgeon: Hurman Horn, MD;  Location: [place];  Service: Ophthalmology;  Laterality: Right;   MI with stent placement  2004   PARS PLANA VITRECTOMY Right 10/23/2018   Procedure: PARS PLANA VITRECTOMY WITH 25 GAUGE;  Surgeon: Hurman Horn, MD;  Location:  [place] OR;  Service: Ophthalmology;  Laterality: Right;    Current Medications: No outpatient medications have been marked as taking for the 02/08/22 encounter (Appointment) with Freada Bergeron, MD.     Allergies:   Sulfonamide derivatives, Lokelma [sodium zirconium cyclosilicate], Aricept [donepezil hcl], and Tramadol   Social History   Socioeconomic History   Marital status: Widowed    Spouse name: Not on file   Number of children: 3   Years of education: Not on file   Highest education level: High school graduate  Occupational History   Occupation: retired  Tobacco Use   Smoking status: Never    Passive exposure: Past   Smokeless tobacco: Never   Tobacco comments:    Both parents smoked, patient was exposed/ "Raised up in smoke, my whole life."  Vaping Use   Vaping Use: Never used  Substance and Sexual Activity   Alcohol use: No    Alcohol/week: 0.0 standard drinks of alcohol   Drug use: No   Sexual activity: Not Currently  Other Topics Concern   Not on file  Social History Narrative   Not on file   Social Determinants of Health   Financial Resource Strain: Low Risk  (10/14/2021)   Overall Financial Resource Strain (CARDIA)    Difficulty of Paying Living  Expenses: Not very hard  Food Insecurity: No Food Insecurity (10/14/2021)   Hunger Vital Sign    Worried About Running Out of Food in the Last Year: Never true    Ran Out of Food in the Last Year: Never true  Transportation Needs: No Transportation Needs (10/14/2021)   PRAPARE - Hydrologist (Medical): No    Lack of Transportation (Non-Medical): No  Physical Activity: Inactive (07/26/2017)   Exercise Vital Sign    Days of Exercise per Week: 0 days    Minutes of Exercise per Session: 0 min  Stress: Stress Concern Present (07/26/2017)   [place]    Feeling of Stress : Rather much  Social Connections: Moderately Integrated (07/26/2017)   Social Connection and Isolation Panel [NHANES]    Frequency of Communication with Friends and Family: Once a week    Frequency of Social Gatherings with Friends and Family: Three times a week    Attends Religious Services: 1 to 4 times per year    Active Member of Clubs or Organizations: Yes    Attends Archivist Meetings: 1 to 4 times per year    Marital Status: Widowed     Family History: [name] family history includes Arthritis in her father and sister; Breast cancer (age of onset: 63) in her mother; Diabetes in her father and sister; Heart disease in her cousin and mother; Hypertension in her father; Obesity in her sister; Throat cancer in her father. There is no history of Colon cancer, Stomach cancer, or Esophageal cancer.  ROS:   Please see the history of present illness.    Review of Systems  Constitutional:  Negative for fever.  HENT:  Negative for sore throat.   Eyes:  Negative for blurred vision.  Respiratory:  Positive for shortness of breath. Negative for cough.   Cardiovascular:  Negative for chest pain, palpitations, orthopnea, claudication, leg swelling (bilateral) and PND.  Gastrointestinal:  Negative for abdominal pain, blood  in stool, diarrhea and melena.  Genitourinary:  Negative for hematuria.  Musculoskeletal:  Negative for falls.  Skin:  Negative for itching.  Neurological:  Positive for headaches. Negative for dizziness and loss of consciousness.  Endo/Heme/Allergies:  Negative for environmental allergies. Does not bruise/bleed easily.  Psychiatric/Behavioral:  Negative for depression.      EKGs/Labs/Other Studies Reviewed:    The following studies were reviewed today: Echo 07/05/21 1. Left ventricular ejection fraction, by estimation, is 55 to 60%. The  left ventricle has normal function. Left ventricular endocardial border  not optimally defined to evaluate regional wall motion- there are no wall  motion abnormalities seen in views  obtained. Left ventricular diastolic parameters are consistent with Grade  I diastolic dysfunction (impaired relaxation).   2. Right ventricular systolic function is normal. The right ventricular  size is normal.   3. The mitral valve was not well visualized. No evidence of mitral valve  regurgitation. No evidence of mitral stenosis.   4. The aortic valve was not well visualized. Aortic valve regurgitation  is not visualized.   Comparison(s): Difficult comparison- this is a technically difficult  study.   L Heart Cath 16/10/96 LV end diastolic pressure is mildly elevated. ------------------------------ Mid LM to Prox LAD lesion is 30% stenosed with 30% stenosed side branch in Ost Cx. Prox LAD to Mid LAD lesion is 45% stenosed with 25% stenosed side branch in 1st Diag. 1st Mrg lesion is 100% stenosed. ---------------- Colon Flattery RCA to Prox RCA stent is 10% stenosed. Prox RCA to Mid RCA stent is 5% stenosed. Prox RCA lesion is 95% stenosed, between the 2 old stents A drug-eluting stent was successfully placed using a STENT RESOLUTE ONYX 4.0X18 -> postdilated to 4.6-4.7 mm Post intervention, there is a 0% residual stenosis. ---------------- Mid RCA lesion is 85%  stenosed after the second stent A drug-eluting stent was successfully placed using a STENT RESOLUTE ONYX 3.0X18 -> tapered post dilation from 4.2-3.3 mm Post intervention, there is a 0% residual stenosis.   SUMMARY Multivessel CAD with severe (95%) IntraStent stenosis in the proximal RCA and de novo stenosis distal to mid RCA stent (85%) -> both treated with DES PCI using Resolute Onyx DES stents (distal-3.0 mm x 18 mm with tapered post dilation 4.2-3.3 mm; proximal 4.0 mm 18 mm postdilated to 4.7 mm.) Diffuse mild to moderate calcified disease in the distal Left Main as well as ostial and proximal LCx and LAD with no severe stenoses. Borderline elevated LVEDP with severe systemic hypertension     RECOMMENDATIONS Return to nursing unit for ongoing care.  Okay to DC heparin. We will need to determine plus or minus the use of Xarelto plus Plavix.  For now would continue aspirin plus Plavix until discharge and then discontinue aspirin on discharge with Xarelto plus Plavix for 3-6 months. Continue to monitor anemia and other risk factors.   2D Echo 02/15/20  1. Technically difficult echo with poor image quality.   2. Left ventricular ejection fraction, by estimation, is 65 to 70%. The  left ventricle has normal function. The left ventricle has no regional  wall motion abnormalities. Left ventricular diastolic parameters are  consistent with Grade I diastolic  dysfunction (impaired relaxation).   3. Right ventricular systolic function was not well visualized. The right  ventricular size is not well visualized.   4. The mitral valve was not well visualized. No evidence of mitral valve  regurgitation.   5. The aortic valve was not well visualized. Aortic valve regurgitation  is not visualized.    Cath 04/54/09 LV end diastolic pressure is mildly elevated. ------------------------------ Mid LM to Prox LAD lesion is 30%  stenosed with 30% stenosed side branch in Ost Cx. Prox LAD to Mid LAD  lesion is 45% stenosed with 25% stenosed side branch in 1st Diag. 1st Mrg lesion is 100% stenosed. ---------------- Colon Flattery RCA to Prox RCA stent is 10% stenosed. Prox RCA to Mid RCA stent is 5% stenosed. Prox RCA lesion is 95% stenosed, between the 2 old stents A drug-eluting stent was successfully placed using a STENT RESOLUTE ONYX 4.0X18 -> postdilated to 4.6-4.7 mm Post intervention, there is a 0% residual stenosis. ---------------- Mid RCA lesion is 85% stenosed after the second stent A drug-eluting stent was successfully placed using a STENT RESOLUTE ONYX 3.0X18 -> tapered post dilation from 4.2-3.3 mm Post intervention, there is a 0% residual stenosis.   SUMMARY Multivessel CAD with severe (95%) IntraStent stenosis in the proximal RCA and de novo stenosis distal to mid RCA stent (85%) -> both treated with DES PCI using Resolute Onyx DES stents (distal-3.0 mm x 18 mm with tapered post dilation 4.2-3.3 mm; proximal 4.0 mm 18 mm postdilated to 4.7 mm.) Diffuse mild to moderate calcified disease in the distal Left Main as well as ostial and proximal LCx and LAD with no severe stenoses. Borderline elevated LVEDP with severe systemic hypertension     RECOMMENDATIONS Return to nursing unit for ongoing care.  Okay to DC heparin. We will need to determine plus or minus the use of Xarelto plus Plavix.  For now would continue aspirin plus Plavix until discharge and then discontinue aspirin on discharge with Xarelto plus Plavix for 3-6 months. Continue to monitor anemia and other risk factors.  EKG:  EKG was not ordered today 05/18/21: NSR, inferior q waves, HR75  Recent Labs: 07/05/2021: TSH 1.194 11/19/2021: B Natriuretic Peptide 43.5 11/23/2021: Magnesium 1.3 01/17/2022: ALT 6; BUN 66; Creatinine 2.37; Hemoglobin 9.2; Platelet Count 251; Potassium 4.6; Sodium 139  Recent Lipid Panel    Component Value Date/Time   CHOL 97 01/26/2021 1405   CHOL 136 04/20/2015 1001   TRIG 109 01/26/2021 1405    HDL 36 (L) 01/26/2021 1405   HDL 40 04/20/2015 1001   CHOLHDL 2.7 01/26/2021 1405   VLDL 17 02/15/2020 0636   LDLCALC 42 01/26/2021 1405     Risk Assessment/Calculations:           Physical Exam:    VS:  There were no vitals taken for this visit.    Wt Readings from Last 3 Encounters:  01/17/22 (!) 309 lb (140.2 kg)  01/16/22 (!) 309 lb (140.2 kg)  12/30/21 (!) 309 lb 9.6 oz (140.4 kg)     GEN:  Comfortable, wheel chair bound HEENT: Normal NECK: No JVD; No carotid bruits CARDIAC: RRR, no murmurs, rubs, gallops RESPIRATORY:  Clear to auscultation without rales, wheezing or rhonchi  ABDOMEN: Soft, non-tender, non-distended MUSCULOSKELETAL:  Trace ankle edema, warm  SKIN: Warm and dry NEUROLOGIC:  Alert and oriented x 3 PSYCHIATRIC:  Normal affect   ASSESSMENT:    No diagnosis found.    PLAN:    In order of problems listed above:  #CAD with Prior NSTEMI 02/2020 s/p PCI to RCA: Had BMS to RCA 2004, DESx2 to mid and proximal RCA 2012, recent NSTEMI 02/2020 with ISR in [place] and mRCA s/p DES. Doing okay with no anginal symptoms although she is mainly sedentary. -Continue ASA '81mg'$  daily -Continue plavix '75mg'$  daily -Continue lipitor '80mg'$  daily -Continue coreg 12.'5mg'$  BID -Nitro prn  #Chronic Diastolic HF: Currently appears euvolemic. Minimally mobile at this time and is  mainly wheelchair bound.  -Continue torsemide '20mg'$  daily with additional dose as needed -Continue coreg 12.'5mg'$  BID -Low Na diet  #History of PE/DVT: No longer on xarelto due to anemia. On DAPT. -Continue ASA and plavix -Not on AC due to anemia  #HLD: -Continue lipitor '80mg'$  daily -LDL 42 on 01/2021  #HTN: Fairly well controlled, mainly 120-130s at home. -Continue coreg 12.'5mg'$  BID  #DMII on Insulin: A1C 8.5. -Management per PCP -Goal A1C <7  #Anemia:  Denies bleeding. HgB stable at 8.8. -Management per PCP      Medication Adjustments/Labs and Tests Ordered: Current medicines  are reviewed at length with the patient today.  Concerns regarding medicines are outlined above.  No orders of the defined types were placed in this encounter.  No orders of the defined types were placed in this encounter.   There are no Patient Instructions on file for this visit.   I,Mykaella Javier,acting as a scribe for Freada Bergeron, MD.,have documented all relevant documentation on the behalf of Freada Bergeron, MD,as directed by  Freada Bergeron, MD while in the presence of Freada Bergeron, MD.  I, Freada Bergeron, MD, have reviewed all documentation for this visit. The documentation on 02/02/22 for the exam, diagnosis, procedures, and orders are all accurate and complete.   Signed, Freada Bergeron, MD  02/02/2022 2:05 PM    [place]

## 2022-02-07 ENCOUNTER — Telehealth: Payer: Self-pay | Admitting: Family Medicine

## 2022-02-07 DIAGNOSIS — E1122 Type 2 diabetes mellitus with diabetic chronic kidney disease: Secondary | ICD-10-CM | POA: Diagnosis not present

## 2022-02-07 DIAGNOSIS — I130 Hypertensive heart and chronic kidney disease with heart failure and stage 1 through stage 4 chronic kidney disease, or unspecified chronic kidney disease: Secondary | ICD-10-CM | POA: Diagnosis not present

## 2022-02-07 DIAGNOSIS — N1831 Chronic kidney disease, stage 3a: Secondary | ICD-10-CM | POA: Diagnosis not present

## 2022-02-07 DIAGNOSIS — F0390 Unspecified dementia without behavioral disturbance: Secondary | ICD-10-CM | POA: Diagnosis not present

## 2022-02-07 DIAGNOSIS — E1140 Type 2 diabetes mellitus with diabetic neuropathy, unspecified: Secondary | ICD-10-CM | POA: Diagnosis not present

## 2022-02-07 DIAGNOSIS — G894 Chronic pain syndrome: Secondary | ICD-10-CM | POA: Diagnosis not present

## 2022-02-07 DIAGNOSIS — I5033 Acute on chronic diastolic (congestive) heart failure: Secondary | ICD-10-CM | POA: Diagnosis not present

## 2022-02-07 DIAGNOSIS — Z794 Long term (current) use of insulin: Secondary | ICD-10-CM | POA: Diagnosis not present

## 2022-02-07 DIAGNOSIS — I2510 Atherosclerotic heart disease of native coronary artery without angina pectoris: Secondary | ICD-10-CM | POA: Diagnosis not present

## 2022-02-07 DIAGNOSIS — J449 Chronic obstructive pulmonary disease, unspecified: Secondary | ICD-10-CM | POA: Diagnosis not present

## 2022-02-07 NOTE — Telephone Encounter (Signed)
Caller Name: [name]- [facility] Call back phone #: [phone]  Reason for Call: Pt has had a constant pain in her left shoulder that she is rating at a 9/10 in the pain scale. Please call with recommendations on how to proceed

## 2022-02-08 ENCOUNTER — Ambulatory Visit: Payer: HMO | Attending: Cardiology | Admitting: Cardiology

## 2022-02-08 ENCOUNTER — Emergency Department (HOSPITAL_BASED_OUTPATIENT_CLINIC_OR_DEPARTMENT_OTHER): Payer: HMO

## 2022-02-08 ENCOUNTER — Emergency Department (HOSPITAL_BASED_OUTPATIENT_CLINIC_OR_DEPARTMENT_OTHER): Payer: HMO | Admitting: Radiology

## 2022-02-08 ENCOUNTER — Encounter: Payer: Self-pay | Admitting: Cardiology

## 2022-02-08 ENCOUNTER — Encounter (HOSPITAL_BASED_OUTPATIENT_CLINIC_OR_DEPARTMENT_OTHER): Payer: Self-pay

## 2022-02-08 ENCOUNTER — Other Ambulatory Visit: Payer: Self-pay

## 2022-02-08 ENCOUNTER — Inpatient Hospital Stay (HOSPITAL_BASED_OUTPATIENT_CLINIC_OR_DEPARTMENT_OTHER)
Admission: EM | Admit: 2022-02-08 | Discharge: 2022-02-12 | DRG: 445 | Disposition: A | Discharge status: Home / Self Care | Payer: HMO | Attending: Internal Medicine | Admitting: Internal Medicine

## 2022-02-08 ENCOUNTER — Encounter (HOSPITAL_COMMUNITY): Payer: Self-pay

## 2022-02-08 VITALS — BP 128/64 | HR 67 | Ht 67.0 in | Wt 309.0 lb

## 2022-02-08 DIAGNOSIS — E1169 Type 2 diabetes mellitus with other specified complication: Secondary | ICD-10-CM | POA: Diagnosis present

## 2022-02-08 DIAGNOSIS — Z79899 Other long term (current) drug therapy: Secondary | ICD-10-CM

## 2022-02-08 DIAGNOSIS — I5032 Chronic diastolic (congestive) heart failure: Secondary | ICD-10-CM | POA: Diagnosis present

## 2022-02-08 DIAGNOSIS — R079 Chest pain, unspecified: Secondary | ICD-10-CM | POA: Diagnosis not present

## 2022-02-08 DIAGNOSIS — E1122 Type 2 diabetes mellitus with diabetic chronic kidney disease: Secondary | ICD-10-CM | POA: Diagnosis present

## 2022-02-08 DIAGNOSIS — F32A Depression, unspecified: Secondary | ICD-10-CM | POA: Diagnosis present

## 2022-02-08 DIAGNOSIS — M109 Gout, unspecified: Secondary | ICD-10-CM | POA: Diagnosis present

## 2022-02-08 DIAGNOSIS — E66813 Obesity, class 3: Secondary | ICD-10-CM | POA: Diagnosis present

## 2022-02-08 DIAGNOSIS — Z66 Do not resuscitate: Secondary | ICD-10-CM | POA: Diagnosis present

## 2022-02-08 DIAGNOSIS — Z7951 Long term (current) use of inhaled steroids: Secondary | ICD-10-CM

## 2022-02-08 DIAGNOSIS — F419 Anxiety disorder, unspecified: Secondary | ICD-10-CM | POA: Diagnosis not present

## 2022-02-08 DIAGNOSIS — I252 Old myocardial infarction: Secondary | ICD-10-CM

## 2022-02-08 DIAGNOSIS — K2100 Gastro-esophageal reflux disease with esophagitis, without bleeding: Secondary | ICD-10-CM | POA: Diagnosis present

## 2022-02-08 DIAGNOSIS — Z9884 Bariatric surgery status: Secondary | ICD-10-CM

## 2022-02-08 DIAGNOSIS — Z888 Allergy status to other drugs, medicaments and biological substances status: Secondary | ICD-10-CM

## 2022-02-08 DIAGNOSIS — R5381 Other malaise: Secondary | ICD-10-CM | POA: Diagnosis not present

## 2022-02-08 DIAGNOSIS — Z885 Allergy status to narcotic agent status: Secondary | ICD-10-CM

## 2022-02-08 DIAGNOSIS — F0394 Unspecified dementia, unspecified severity, with anxiety: Secondary | ICD-10-CM | POA: Diagnosis present

## 2022-02-08 DIAGNOSIS — I10 Essential (primary) hypertension: Secondary | ICD-10-CM | POA: Diagnosis not present

## 2022-02-08 DIAGNOSIS — Z882 Allergy status to sulfonamides status: Secondary | ICD-10-CM

## 2022-02-08 DIAGNOSIS — Z86718 Personal history of other venous thrombosis and embolism: Secondary | ICD-10-CM

## 2022-02-08 DIAGNOSIS — R0600 Dyspnea, unspecified: Secondary | ICD-10-CM | POA: Diagnosis not present

## 2022-02-08 DIAGNOSIS — D509 Iron deficiency anemia, unspecified: Secondary | ICD-10-CM | POA: Diagnosis present

## 2022-02-08 DIAGNOSIS — Z6841 Body Mass Index (BMI) 40.0 and over, adult: Secondary | ICD-10-CM | POA: Diagnosis not present

## 2022-02-08 DIAGNOSIS — G894 Chronic pain syndrome: Secondary | ICD-10-CM | POA: Diagnosis present

## 2022-02-08 DIAGNOSIS — K838 Other specified diseases of biliary tract: Secondary | ICD-10-CM | POA: Diagnosis not present

## 2022-02-08 DIAGNOSIS — R7989 Other specified abnormal findings of blood chemistry: Secondary | ICD-10-CM | POA: Diagnosis present

## 2022-02-08 DIAGNOSIS — K8020 Calculus of gallbladder without cholecystitis without obstruction: Secondary | ICD-10-CM | POA: Diagnosis present

## 2022-02-08 DIAGNOSIS — N184 Chronic kidney disease, stage 4 (severe): Secondary | ICD-10-CM | POA: Diagnosis present

## 2022-02-08 DIAGNOSIS — I2510 Atherosclerotic heart disease of native coronary artery without angina pectoris: Secondary | ICD-10-CM

## 2022-02-08 DIAGNOSIS — Z86711 Personal history of pulmonary embolism: Secondary | ICD-10-CM

## 2022-02-08 DIAGNOSIS — Z853 Personal history of malignant neoplasm of breast: Secondary | ICD-10-CM | POA: Diagnosis not present

## 2022-02-08 DIAGNOSIS — Z79811 Long term (current) use of aromatase inhibitors: Secondary | ICD-10-CM

## 2022-02-08 DIAGNOSIS — R0602 Shortness of breath: Secondary | ICD-10-CM | POA: Diagnosis not present

## 2022-02-08 DIAGNOSIS — Z90710 Acquired absence of both cervix and uterus: Secondary | ICD-10-CM

## 2022-02-08 DIAGNOSIS — J4489 Other specified chronic obstructive pulmonary disease: Secondary | ICD-10-CM | POA: Diagnosis present

## 2022-02-08 DIAGNOSIS — I130 Hypertensive heart and chronic kidney disease with heart failure and stage 1 through stage 4 chronic kidney disease, or unspecified chronic kidney disease: Secondary | ICD-10-CM | POA: Diagnosis present

## 2022-02-08 DIAGNOSIS — Z7982 Long term (current) use of aspirin: Secondary | ICD-10-CM

## 2022-02-08 DIAGNOSIS — Z955 Presence of coronary angioplasty implant and graft: Secondary | ICD-10-CM | POA: Diagnosis not present

## 2022-02-08 DIAGNOSIS — R1030 Lower abdominal pain, unspecified: Secondary | ICD-10-CM | POA: Diagnosis present

## 2022-02-08 DIAGNOSIS — F0390 Unspecified dementia without behavioral disturbance: Secondary | ICD-10-CM | POA: Diagnosis present

## 2022-02-08 DIAGNOSIS — Z833 Family history of diabetes mellitus: Secondary | ICD-10-CM

## 2022-02-08 DIAGNOSIS — K5900 Constipation, unspecified: Secondary | ICD-10-CM | POA: Diagnosis present

## 2022-02-08 DIAGNOSIS — E119 Type 2 diabetes mellitus without complications: Secondary | ICD-10-CM

## 2022-02-08 DIAGNOSIS — E860 Dehydration: Secondary | ICD-10-CM | POA: Diagnosis present

## 2022-02-08 DIAGNOSIS — G4733 Obstructive sleep apnea (adult) (pediatric): Secondary | ICD-10-CM | POA: Diagnosis present

## 2022-02-08 DIAGNOSIS — Z794 Long term (current) use of insulin: Secondary | ICD-10-CM

## 2022-02-08 DIAGNOSIS — I509 Heart failure, unspecified: Principal | ICD-10-CM

## 2022-02-08 DIAGNOSIS — I25118 Atherosclerotic heart disease of native coronary artery with other forms of angina pectoris: Secondary | ICD-10-CM

## 2022-02-08 DIAGNOSIS — I110 Hypertensive heart disease with heart failure: Secondary | ICD-10-CM | POA: Diagnosis not present

## 2022-02-08 DIAGNOSIS — Z803 Family history of malignant neoplasm of breast: Secondary | ICD-10-CM

## 2022-02-08 DIAGNOSIS — E785 Hyperlipidemia, unspecified: Secondary | ICD-10-CM | POA: Diagnosis present

## 2022-02-08 DIAGNOSIS — M19012 Primary osteoarthritis, left shoulder: Secondary | ICD-10-CM | POA: Diagnosis not present

## 2022-02-08 DIAGNOSIS — Z7902 Long term (current) use of antithrombotics/antiplatelets: Secondary | ICD-10-CM

## 2022-02-08 DIAGNOSIS — Z9049 Acquired absence of other specified parts of digestive tract: Secondary | ICD-10-CM

## 2022-02-08 DIAGNOSIS — M1990 Unspecified osteoarthritis, unspecified site: Secondary | ICD-10-CM | POA: Diagnosis present

## 2022-02-08 DIAGNOSIS — N281 Cyst of kidney, acquired: Secondary | ICD-10-CM | POA: Diagnosis not present

## 2022-02-08 DIAGNOSIS — Z8261 Family history of arthritis: Secondary | ICD-10-CM

## 2022-02-08 DIAGNOSIS — F429 Obsessive-compulsive disorder, unspecified: Secondary | ICD-10-CM | POA: Diagnosis present

## 2022-02-08 DIAGNOSIS — Z8249 Family history of ischemic heart disease and other diseases of the circulatory system: Secondary | ICD-10-CM

## 2022-02-08 LAB — COMPREHENSIVE METABOLIC PANEL
ALT: 7 U/L (ref 0–44)
AST: 11 U/L — ABNORMAL LOW (ref 15–41)
Albumin: 3.6 g/dL (ref 3.5–5.0)
Alkaline Phosphatase: 74 U/L (ref 38–126)
Anion gap: 9 (ref 5–15)
BUN: 67 mg/dL — ABNORMAL HIGH (ref 8–23)
CO2: 26 mmol/L (ref 22–32)
Calcium: 9.2 mg/dL (ref 8.9–10.3)
Chloride: 102 mmol/L (ref 98–111)
Creatinine, Ser: 2.92 mg/dL — ABNORMAL HIGH (ref 0.44–1.00)
GFR, Estimated: 15 mL/min — ABNORMAL LOW (ref >60)
Glucose, Bld: 169 mg/dL — ABNORMAL HIGH (ref 70–99)
Potassium: 5.4 mmol/L — ABNORMAL HIGH (ref 3.5–5.1)
Sodium: 137 mmol/L (ref 135–145)
Total Bilirubin: 0.4 mg/dL (ref 0.3–1.2)
Total Protein: 7.7 g/dL (ref 6.5–8.1)

## 2022-02-08 LAB — URINALYSIS, MICROSCOPIC (REFLEX)

## 2022-02-08 LAB — URINALYSIS, ROUTINE W REFLEX MICROSCOPIC
Bilirubin Urine: NEGATIVE
Glucose, UA: NEGATIVE mg/dL
Hgb urine dipstick: NEGATIVE
Ketones, ur: NEGATIVE mg/dL
Nitrite: NEGATIVE
Protein, ur: NEGATIVE mg/dL
Specific Gravity, Urine: 1.01 (ref 1.005–1.030)
pH: 5.5 (ref 5.0–8.0)

## 2022-02-08 LAB — CBC
HCT: 29.7 % — ABNORMAL LOW (ref 36.0–46.0)
Hemoglobin: 9.3 g/dL — ABNORMAL LOW (ref 12.0–15.0)
MCH: 29.1 pg (ref 26.0–34.0)
MCHC: 31.3 g/dL (ref 30.0–36.0)
MCV: 92.8 fL (ref 80.0–100.0)
Platelets: 256 10*3/uL (ref 150–400)
RBC: 3.2 MIL/uL — ABNORMAL LOW (ref 3.87–5.11)
RDW: 13.6 % (ref 11.5–15.5)
WBC: 8.9 10*3/uL (ref 4.0–10.5)
nRBC: 0 % (ref 0.0–0.2)

## 2022-02-08 LAB — LIPASE, BLOOD: Lipase: 35 U/L (ref 11–51)

## 2022-02-08 LAB — LACTIC ACID, PLASMA
Lactic Acid, Venous: 1.3 mmol/L (ref 0.5–1.9)
Lactic Acid, Venous: 1.2 mmol/L (ref 0.5–1.9)

## 2022-02-08 LAB — BRAIN NATRIURETIC PEPTIDE: B Natriuretic Peptide: 56.5 pg/mL (ref 0.0–100.0)

## 2022-02-08 LAB — TROPONIN I (HIGH SENSITIVITY)
Troponin I (High Sensitivity): 9 ng/L (ref <18)
Troponin I (High Sensitivity): 9 ng/L (ref <18)

## 2022-02-08 LAB — D-DIMER, QUANTITATIVE: D-Dimer, Quant: 4.74 ug/mL-FEU — ABNORMAL HIGH (ref 0.00–0.50)

## 2022-02-08 MED ORDER — FUROSEMIDE 10 MG/ML IJ SOLN
40.0000 mg | Freq: Once | INTRAMUSCULAR | Status: AC
  Administered 2022-02-08: 40 mg via INTRAVENOUS
  Filled 2022-02-08: qty 4

## 2022-02-08 MED ORDER — FENTANYL CITRATE PF 50 MCG/ML IJ SOSY
50.0000 ug | PREFILLED_SYRINGE | Freq: Once | INTRAMUSCULAR | Status: AC
  Administered 2022-02-08: 50 ug via INTRAVENOUS
  Filled 2022-02-08: qty 1

## 2022-02-08 MED ORDER — PIPERACILLIN-TAZOBACTAM 3.375 G IVPB 30 MIN
3.3750 g | Freq: Once | INTRAVENOUS | Status: AC
  Administered 2022-02-08: 3.375 g via INTRAVENOUS
  Filled 2022-02-08: qty 50

## 2022-02-08 MED ORDER — PIPERACILLIN-TAZOBACTAM 3.375 G IVPB
3.3750 g | Freq: Three times a day (TID) | INTRAVENOUS | Status: DC
  Administered 2022-02-09 (×2): 3.375 g via INTRAVENOUS
  Filled 2022-02-08 (×2): qty 50

## 2022-02-08 NOTE — Progress Notes (Signed)
Cardiology Office Note:    Date:  02/08/2022   ID:  [name], DOB [birth year], MRN [id number]  PCP:  Bonnita Hollow, MD   [facility] Providers Cardiologist:  Ena Dawley, MD {   Referring MD: [name], NP    History of Present Illness:    [name] is a 87 y.o. female with a hx of dementia, CAD (BMS to RCA 2004, DESx2 to mid and proximal RCA 2012, recent NSTEMI 02/2020 s/p DES to RCA), chronic diastolic CHF, DM, morbid obesity, remote gastric bypass, PMR, HLD, depression, OCD, anxiety, COPD, prior PE/DVT, CKD stage III, breast CA who presents to clinic for follow-up.  Per review of the record, the patient has history of multiple medical problems as outlined above. She was admitted 02/2020 with chest pain/NSTEMI and underwent cath showing multivessel CAD but severe ISR in pRCA and m/dRCA lesion both treated with DES. There was diffuse mild to moderate disease in the LM as well as Lcx and LAD but nonobstructive and treated medically. LVEF was normal, borderline elevated LVEDP. 2D echo was technically difficult given habitus. Hospital course also notable for anemia requiring blood transfusion (has prior h/o anemia). IM discussed case with Dr. Irene Limbo, her oncologist, regarding ongoing maintenance Xarelto for prior h/o PE/DVT and the mutual decision was made to hold further Xarelto given need for DAPT and history of anemia.   Was seen in 02/2020 with [name] where she was tachycardic with elevated D-dimer. V/Q scan obtained without evidence of PE.  Was admitted 10/13/21 with acute on chronic HFpEF. Diuresed with IV lasix and later transitioned to lasix 40 mg po daily. Discharge weight 315 pounds. Discharged 10/22/21.   Was seen in clinic by [name], NP on 10/25/21 where she was wheelchair bound and required assistance with all ADLs. She was continued on lasix '40mg'$  daily with extra dose as needed.  Was last seen in clinic on 12/30/21 where she was feeling okay.  Continued to have SOB with activity. Required assistance with ADLs.   Today, she is accompanied by a family member.   She states that she feels miserable. The patient states that she hasn't been feeling well. She hasn't been able rest, sleep, or lie down. Has not felt well since her hospitalization in 11/2021 but symptoms have progressed.   She states she is feeling "very tired in her chest" like there is a weight on her. When she takes a deep breath, the feeling of heaviness increases. She declined repeat ECG today to evaluate further.   She feels significantly constipated. She reports having frequent urges to use the bathroom but not being able to do so. She is urinating well. Also notes some left sided abdominal pain.    She is not able to characterize her symptoms further. She just says she needs help. Long discussion held and she is amenable to go to the ER for further evaluation.  She is complaint with her medications. She denies any palpitations, or peripheral edema. No lightheadedness, headaches, syncope, or PND.   Past Medical History:  Diagnosis Date   Abdominal pain 01/26/2012   Acute kidney injury superimposed on chronic kidney disease ([name]) 12/18/2014   Allergy    takes Mucinex daily as needed   Anemia, unspecified    Anxiety    takes Clonazepam daily as needed   Anxiety associated with depression 06/08/2017   Arthritis    Breast cancer ([place])    Breast cancer screening 02/19/2014   Breast mass  10/27/2014   Chronic diastolic CHF (congestive heart failure) (HCC)    takes Furosemide daily   Chronic pain syndrome 02/06/2015   CKD (chronic kidney disease), stage III ([place])    Coronary artery disease    a. s/p IMI 2004 tx with BMS to RCA;  b. s/p Promus DES to RCA 2/12 c. abnormal nuc 2016 -> cath with med rx. d. NSTEMI 02/2020  mv CAD with severe ISR in pRCA and m/dRCA lesion both treated with DES.   Debility 08/01/2012   Dementia ([place])    Depression    takes Cymbalta daily    Diabetes mellitus    insulin daily   DOE (dyspnea on exertion) 06/24/2014   Dysphagia, unspecified(787.20) 07/31/2012   Fungal dermatitis 11/13/2007   Qualifier: Diagnosis of  By: Burnice Logan  MD, Doretha Sou    GERD (gastroesophageal reflux disease)    takes Protonix daily   Gout, unspecified    Headache    occasionally   History of blood clots    History of deep venous thrombosis 01/27/2012   History of pulmonary embolus (PE) 04/22/2014   Hyperkalemia 07/26/2017   Hyperlipidemia    takes Pravastatin daily   Hypertension    Insomnia    Joint pain    Joint swelling    Morbid obesity ([place])    Multiple benign nevi 11/15/2016   Myocardial infarction ([place]) 2004   Non-STEMI (non-ST elevated myocardial infarction) ([place]) 02/15/2020   Numbness    Obstructive sleep apnea    does not wear cpap   Osteoarthritis    Osteoarthritis    Osteoarthrosis, unspecified whether generalized or localized, lower leg    Pain in the chest 12/31/2013   Pain, chronic    Personal history of other diseases of the digestive system 12/03/2006   Qualifier: Diagnosis of  By: Burnice Logan  MD, Doretha Sou    Polymyalgia rheumatica [facility])    Presence of stent in right coronary artery    Pulmonary emboli ([place]) 01/2012   felt to need lifelong anticoagulation but Xarelto dc in 02/2020 due to need for DAPT   Situational depression 06/04/2009   Qualifier: Diagnosis of  By: Burnice Logan  MD, Doretha Sou    Syncope, vasovagal 07/04/2021   Urinary incontinence    takes Linzess daily   Vaginitis and vulvovaginitis 06/23/2016   Vertigo    hx of;was taking Meclizine if needed   Wears dentures     Past Surgical History:  Procedure Laterality Date   ABDOMINAL HYSTERECTOMY     partial   APPENDECTOMY     blood clots/legs and lungs  2013   BREAST BIOPSY Left 07/22/2014   BREAST BIOPSY Left 02/10/2013   BREAST LUMPECTOMY Left 11/05/2014   BREAST LUMPECTOMY WITH RADIOACTIVE SEED LOCALIZATION Left 11/05/2014   Procedure: LEFT BREAST  LUMPECTOMY WITH RADIOACTIVE SEED LOCALIZATION;  Surgeon: [name], MD;  Location: [place];  Service: General;  Laterality: Left;   CARDIAC CATHETERIZATION     COLONOSCOPY     CORONARY ANGIOPLASTY  2   CORONARY STENT INTERVENTION N/A 02/17/2020   Procedure: CORONARY STENT INTERVENTION;  Surgeon: Leonie Man, MD;  Location: [place] CV [facility];  Service: Cardiovascular;  Laterality: N/A;   ESOPHAGOGASTRODUODENOSCOPY (EGD) WITH PROPOFOL N/A 11/07/2016   Procedure: ESOPHAGOGASTRODUODENOSCOPY (EGD) WITH PROPOFOL;  Surgeon: Gatha Mayer, MD;  Location: [facility];  Service: Endoscopy;  Laterality: N/A;   EXCISION OF SKIN TAG Right 11/05/2014   Procedure: EXCISION OF RIGHT EYELID SKIN TAG;  Surgeon: [name],  MD;  Location: [place];  Service: General;  Laterality: Right;   EYE SURGERY Bilateral    cataract    GASTRIC BYPASS  1977    reversed in 1979, [place] CATH AND CORONARY ANGIOGRAPHY N/A 02/17/2020   Procedure: LEFT HEART CATH AND CORONARY ANGIOGRAPHY;  Surgeon: Leonie Man, MD;  Location: [place] [facility];  Service: Cardiovascular;  Laterality: N/A;   LEFT HEART CATHETERIZATION WITH CORONARY ANGIOGRAM N/A 06/29/2014   Procedure: LEFT HEART CATHETERIZATION WITH CORONARY ANGIOGRAM;  Surgeon: Troy Sine, MD;  [facility];  Service: Cardiovascular;  Laterality: N/A;   MEMBRANE PEEL Right 10/23/2018   Procedure: MEMBRANE PEEL;  Surgeon: Hurman Horn, MD;  Location: [place];  Service: Ophthalmology;  Laterality: Right;   MI with stent placement  2004   PARS PLANA VITRECTOMY Right 10/23/2018   Procedure: PARS PLANA VITRECTOMY WITH 25 GAUGE;  Surgeon: Hurman Horn, MD;  Location: [place];  Service: Ophthalmology;  Laterality: Right;    Current Medications: Current Meds  Medication Sig   albuterol (PROAIR HFA) 108 (90 Base) MCG/ACT inhaler Inhale 1-2 puffs into the lungs every 6 (six) hours as needed for wheezing or shortness of breath.    anastrozole (ARIMIDEX) 1 MG tablet TAKE 1 TABLET BY MOUTH EVERY DAY   ASPIRIN LOW DOSE 81 MG EC tablet TAKE 1 TABLET BY MOUTH EVERY DAY   atorvastatin (LIPITOR) 80 MG tablet TAKE 1 TABLET BY MOUTH EVERY DAY   budesonide-formoterol (SYMBICORT) 160-4.5 MCG/ACT inhaler Inhale 2 puffs into the lungs 2 (two) times daily.   carvedilol (COREG) 12.5 MG tablet TAKE 1 TABLET BY MOUTH 2 TIMES DAILY   cetirizine (ZYRTEC) 10 MG tablet Take 10 mg by mouth daily as needed for allergies or rhinitis.   clopidogrel (PLAVIX) 75 MG tablet Take 1 tablet (75 mg total) by mouth daily.   Continuous Blood Gluc Sensor (FREESTYLE LIBRE 14 DAY SENSOR) MISC PLACE 1 DEVICE ON THE SKIN AS DIRECTED EVERY 14 DAYS   diclofenac Sodium (VOLTAREN) 1 % GEL Apply 2 g topically 4 (four) times daily.   escitalopram (LEXAPRO) 20 MG tablet Take 1 tablet (20 mg total) by mouth daily. Take 1/2 tablet in 2 weeks, then go to whole tablet   escitalopram (LEXAPRO) 20 MG tablet Take 1 tablet (20 mg total) by mouth daily.   febuxostat (ULORIC) 40 MG tablet TAKE 1 TABLET BY MOUTH EVERY DAY   fluticasone (FLONASE) 50 MCG/ACT nasal spray Place 1 spray into both nostrils in the morning and at bedtime.   folic acid (FOLVITE) 1 MG tablet TAKE 1 TABLET BY MOUTH EVERY DAY   guaifenesin (ROBITUSSIN) 100 MG/5ML syrup Take 200 mg by mouth 3 (three) times daily as needed for cough.   hydrOXYzine (ATARAX) 25 MG tablet Take 1-2 tablets (25-50 mg total) by mouth every 8 (eight) hours as needed for anxiety or itching.   insulin aspart (NOVOLOG FLEXPEN) 100 UNIT/ML FlexPen Inject 14 Units into the skin 3 (three) times daily before meals.   insulin glargine, 2 Unit Dial, (TOUJEO MAX SOLOSTAR) 300 UNIT/ML Solostar Pen Inject 42 Units into the skin daily. Patient assistance program provides   linaclotide (LINZESS) 145 MCG CAPS capsule Take 1 capsule (145 mcg total) by mouth daily before breakfast.   meclizine (ANTIVERT) 25 MG tablet TAKE 1 TABLET BY MOUTH THREE TIMES  DAILY AS NEEDED FOR DIZZINESS   memantine (NAMENDA) 5 MG tablet TAKE 2 TABLETS BY MOUTH EVERY DAY  and TAKE 1 TABLET BY MOUTH EVERY EVENING   nitroGLYCERIN (NITROSTAT) 0.4 MG SL tablet Take one tablet under the tongue every 5 minutes as needed for chest pain   nystatin cream (MYCOSTATIN) Apply 1 application topically 2 (two) times daily.   OVER THE COUNTER MEDICATION Take 1 capsule by mouth every other day. Over the counter stool softener - unknown type   oxyCODONE-acetaminophen (PERCOCET) 7.5-325 MG tablet Take 1 tablet by mouth every 12 (twelve) hours as needed for moderate pain or severe pain. Must last 30 days.   pantoprazole (PROTONIX) 20 MG tablet TAKE 1 TABLET BY MOUTH EVERY DAY   sodium polystyrene (SPS) 15 GM/60ML suspension take 65ms by MOUTH ONCE WEEKLY. TO keep potassium down   torsemide (DEMADEX) 20 MG tablet TAKE 1 TABLET BY MOUTH EVERY DAY - take an extra dose for weight gain, 3lb in 1 day or 5lbs in 1 week   Vitamin D, Ergocalciferol, (DRISDOL) 1.25 MG (50000 UNIT) CAPS capsule TAKE 1 CAPSULE BY MOUTH every 7 days     Allergies:   Sulfonamide derivatives, Lokelma [sodium zirconium cyclosilicate], Aricept [donepezil hcl], and Tramadol   Social History   Socioeconomic History   Marital status: Widowed    Spouse name: Not on file   Number of children: 3   Years of education: Not on file   Highest education level: High school graduate  Occupational History   Occupation: retired  Tobacco Use   Smoking status: Never    Passive exposure: Past   Smokeless tobacco: Never   Tobacco comments:    Both parents smoked, patient was exposed/ "Raised up in smoke, my whole life."  Vaping Use   Vaping Use: Never used  Substance and Sexual Activity   Alcohol use: No    Alcohol/week: 0.0 standard drinks of alcohol   Drug use: No   Sexual activity: Not Currently  Other Topics Concern   Not on file  Social History Narrative   Not on file   Social Determinants of Health    Financial Resource Strain: Low Risk  (10/14/2021)   Overall Financial Resource Strain (CARDIA)    Difficulty of Paying Living Expenses: Not very hard  Food Insecurity: No Food Insecurity (10/14/2021)   Hunger Vital Sign    Worried About Running Out of Food in the Last Year: Never true    Ran Out of Food in the Last Year: Never true  Transportation Needs: No Transportation Needs (10/14/2021)   PRAPARE - THydrologist(Medical): No    Lack of Transportation (Non-Medical): No  Physical Activity: Inactive (07/26/2017)   Exercise Vital Sign    Days of Exercise per Week: 0 days    Minutes of Exercise per Session: 0 min  Stress: Stress Concern Present (07/26/2017)   [place]   Feeling of Stress : Rather much  Social Connections: Moderately Integrated (07/26/2017)   Social Connection and Isolation Panel [NHANES]    Frequency of Communication with Friends and Family: Once a week    Frequency of Social Gatherings with Friends and Family: Three times a week    Attends Religious Services: 1 to 4 times per year    Active Member of Clubs or Organizations: Yes    Attends CArchivistMeetings: 1 to 4 times per year    Marital Status: Widowed     Family History: [name] family history includes Arthritis in her father and sister; Breast  cancer (age of onset: 85) in her mother; Diabetes in her father and sister; Heart disease in her cousin and mother; Hypertension in her father; Obesity in her sister; Throat cancer in her father. There is no history of Colon cancer, Stomach cancer, or Esophageal cancer.  ROS:   Please see the history of present illness.    Review of Systems  Constitutional:  Negative for fever.  HENT:  Negative for sore throat.   Eyes:  Negative for blurred vision.  Respiratory:  Positive for shortness of breath. Negative for cough.   Cardiovascular:  Negative for chest pain,  palpitations, orthopnea, claudication, leg swelling and PND.  Gastrointestinal:  Positive for abdominal pain (left). Negative for blood in stool, diarrhea and melena.  Genitourinary:  Negative for hematuria.  Musculoskeletal:  Negative for falls.  Skin:  Negative for itching.  Neurological:  Negative for dizziness, loss of consciousness and headaches.  Endo/Heme/Allergies:  Negative for environmental allergies. Does not bruise/bleed easily.  Psychiatric/Behavioral:  Negative for depression.      EKGs/Labs/Other Studies Reviewed:    The following studies were reviewed today:  Echo 07/05/21 1. Left ventricular ejection fraction, by estimation, is 55 to 60%. The  left ventricle has normal function. Left ventricular endocardial border  not optimally defined to evaluate regional wall motion- there are no wall  motion abnormalities seen in views  obtained. Left ventricular diastolic parameters are consistent with Grade  I diastolic dysfunction (impaired relaxation).   2. Right ventricular systolic function is normal. The right ventricular  size is normal.   3. The mitral valve was not well visualized. No evidence of mitral valve  regurgitation. No evidence of mitral stenosis.   4. The aortic valve was not well visualized. Aortic valve regurgitation  is not visualized.   Comparison(s): Difficult comparison- this is a technically difficult  study.   Bilateral LE Venous Doppler 03/03/2020:  Summary:  RIGHT:  - There is no evidence of deep vein thrombosis in the lower extremity.  However, portions of this examination were limited- see technologist  comments above.     - No cystic structure found in the popliteal fossa.     LEFT:  - There is no evidence of deep vein thrombosis in the lower extremity.  However, portions of this examination were limited- see technologist  comments above.     - No cystic structure found in the popliteal fossa.  L Heart Cath 48/25/00 LV end diastolic  pressure is mildly elevated. ------------------------------ Mid LM to Prox LAD lesion is 30% stenosed with 30% stenosed side branch in Ost Cx. Prox LAD to Mid LAD lesion is 45% stenosed with 25% stenosed side branch in 1st Diag. 1st Mrg lesion is 100% stenosed. ---------------- Colon Flattery RCA to Prox RCA stent is 10% stenosed. Prox RCA to Mid RCA stent is 5% stenosed. Prox RCA lesion is 95% stenosed, between the 2 old stents A drug-eluting stent was successfully placed using a STENT RESOLUTE ONYX 4.0X18 -> postdilated to 4.6-4.7 mm Post intervention, there is a 0% residual stenosis. ---------------- Mid RCA lesion is 85% stenosed after the second stent A drug-eluting stent was successfully placed using a STENT RESOLUTE ONYX 3.0X18 -> tapered post dilation from 4.2-3.3 mm Post intervention, there is a 0% residual stenosis.   SUMMARY Multivessel CAD with severe (95%) IntraStent stenosis in the proximal RCA and de novo stenosis distal to mid RCA stent (85%) -> both treated with DES PCI using Resolute Onyx DES stents (distal-3.0 mm x 18 mm  with tapered post dilation 4.2-3.3 mm; proximal 4.0 mm 18 mm postdilated to 4.7 mm.) Diffuse mild to moderate calcified disease in the distal Left Main as well as ostial and proximal LCx and LAD with no severe stenoses. Borderline elevated LVEDP with severe systemic hypertension     RECOMMENDATIONS Return to nursing unit for ongoing care.  Okay to DC heparin. We will need to determine plus or minus the use of Xarelto plus Plavix.  For now would continue aspirin plus Plavix until discharge and then discontinue aspirin on discharge with Xarelto plus Plavix for 3-6 months. Continue to monitor anemia and other risk factors.   2D Echo 02/15/20  1. Technically difficult echo with poor image quality.   2. Left ventricular ejection fraction, by estimation, is 65 to 70%. The  left ventricle has normal function. The left ventricle has no regional  wall motion  abnormalities. Left ventricular diastolic parameters are  consistent with Grade I diastolic  dysfunction (impaired relaxation).   3. Right ventricular systolic function was not well visualized. The right  ventricular size is not well visualized.   4. The mitral valve was not well visualized. No evidence of mitral valve  regurgitation.   5. The aortic valve was not well visualized. Aortic valve regurgitation  is not visualized.    Cath 27/78/24 LV end diastolic pressure is mildly elevated. ------------------------------ Mid LM to Prox LAD lesion is 30% stenosed with 30% stenosed side branch in Ost Cx. Prox LAD to Mid LAD lesion is 45% stenosed with 25% stenosed side branch in 1st Diag. 1st Mrg lesion is 100% stenosed. ---------------- Colon Flattery RCA to Prox RCA stent is 10% stenosed. Prox RCA to Mid RCA stent is 5% stenosed. Prox RCA lesion is 95% stenosed, between the 2 old stents A drug-eluting stent was successfully placed using a STENT RESOLUTE ONYX 4.0X18 -> postdilated to 4.6-4.7 mm Post intervention, there is a 0% residual stenosis. ---------------- Mid RCA lesion is 85% stenosed after the second stent A drug-eluting stent was successfully placed using a STENT RESOLUTE ONYX 3.0X18 -> tapered post dilation from 4.2-3.3 mm Post intervention, there is a 0% residual stenosis.   SUMMARY Multivessel CAD with severe (95%) IntraStent stenosis in the proximal RCA and de novo stenosis distal to mid RCA stent (85%) -> both treated with DES PCI using Resolute Onyx DES stents (distal-3.0 mm x 18 mm with tapered post dilation 4.2-3.3 mm; proximal 4.0 mm 18 mm postdilated to 4.7 mm.) Diffuse mild to moderate calcified disease in the distal Left Main as well as ostial and proximal LCx and LAD with no severe stenoses. Borderline elevated LVEDP with severe systemic hypertension     RECOMMENDATIONS Return to nursing unit for ongoing care.  Okay to DC heparin. We will need to determine plus or minus  the use of Xarelto plus Plavix.  For now would continue aspirin plus Plavix until discharge and then discontinue aspirin on discharge with Xarelto plus Plavix for 3-6 months. Continue to monitor anemia and other risk factors.  EKG:  EKG is personally reviewed.  02/08/2022: EKG was not ordered. 11/21/2021: Normal sinus rhythm. Rate 68 bpm 05/18/21: NSR, inferior q waves, HR75  Recent Labs: 07/05/2021: TSH 1.194 11/19/2021: B Natriuretic Peptide 43.5 11/23/2021: Magnesium 1.3 01/17/2022: ALT 6; BUN 66; Creatinine 2.37; Hemoglobin 9.2; Platelet Count 251; Potassium 4.6; Sodium 139  Recent Lipid Panel    Component Value Date/Time   CHOL 97 01/26/2021 1405   CHOL 136 04/20/2015 1001   TRIG 109 01/26/2021  1405   HDL 36 (L) 01/26/2021 1405   HDL 40 04/20/2015 1001   CHOLHDL 2.7 01/26/2021 1405   VLDL 17 02/15/2020 0636   LDLCALC 42 01/26/2021 1405     Risk Assessment/Calculations:           Physical Exam:    VS:  BP 128/64   Pulse 67   Ht '5\' 7"'$  (1.702 m)   Wt (!) 309 lb (140.2 kg) Comment: Pt stated weight could not weigh  SpO2 94%   BMI 48.40 kg/m     Wt Readings from Last 3 Encounters:  02/08/22 (!) 309 lb (140.2 kg)  01/17/22 (!) 309 lb (140.2 kg)  01/16/22 (!) 309 lb (140.2 kg)     GEN:  Uncomfortable appearing, wheelchair bound HEENT: Normal NECK: No JVD; No carotid bruits CARDIAC: RRR, no murmurs, rubs, gallops RESPIRATORY: CTAB, tachypneic ABDOMEN: Soft, non-tender, non-distended MUSCULOSKELETAL:  Warm, no edema SKIN: Warm and dry NEUROLOGIC:  Alert and oriented x 3 PSYCHIATRIC:  Normal affect   ASSESSMENT:    1. Chronic diastolic congestive heart failure ([place])   2. Coronary artery disease of native artery of native heart with stable angina pectoris ([place])   3. Primary hypertension   4. History of pulmonary embolus (PE)   5. Diabetes mellitus with coincident hypertension ([place])   6. Essential hypertension   7. CAD in native artery   8. Malaise        PLAN:    In order of problems listed above:  #Generalized Malaise: Patient feeling very unwell in clinic. Unable to characterize her symptoms but states she feels fatigued, "sick all over," constipated with generalized malaise. She reports heaviness in her chest that has been ongoing for 1 week. She declined ECG on multiple asks. Given degree of discomfort, recommend ER for further evaluation.. -Referred to ER for further evaluation  #CAD with Prior NSTEMI 02/2020 s/p PCI to RCA: Had BMS to RCA 2004, DESx2 to mid and proximal RCA 2012, recent NSTEMI 02/2020 with ISR in [place] and mRCA s/p DES. Feeling unwell as detailed above. Declined ECG. Will have work-up in ER. -Referred to ER as above -Declined ECG on multiple attempts today -Continue ASA '81mg'$  daily -Continue plavix '75mg'$  daily -Continue lipitor '80mg'$  daily -Continue coreg 12.'5mg'$  BID -Nitro prn  #Chronic Diastolic HF: Currently appears euvolemic. Minimally mobile at this time and is mainly wheelchair bound.  -Continue torsemide '20mg'$  daily with additional dose as needed -Continue coreg 12.'5mg'$  BID -Low Na diet  #History of PE/DVT: No longer on xarelto due to anemia. On DAPT. -Continue ASA and plavix -Not on AC due to anemia -Referred to ER given symptoms as above  #HLD: -Continue lipitor '80mg'$  daily -LDL 42 on 01/2021  #HTN: Fairly well controlled, mainly 120-130s at home. -Continue coreg 12.'5mg'$  BID  #DMII on Insulin: A1C 8.5. -Management per PCP -Goal A1C <7  #Anemia:  Denies bleeding. HgB stable at 8.8. -Management per PCP  Follow Up: Recommend to present to the ED.She declined [facility]. Daughter will drive her to [place].  Medication Adjustments/Labs and Tests Ordered: Current medicines are reviewed at length with the patient today.  Concerns regarding medicines are outlined above.   No orders of the defined types were placed in this encounter.  No orders of the defined types were placed  in this encounter.   Patient Instructions  Medication Instructions:   Your physician recommends that you continue on your current medications as directed. Please refer to the Current  Medication list given to you today.  *If you need a refill on your cardiac medications before your next appointment, please call your pharmacy*    Follow-Up:  3 MONTH FOLLOW-UP WITH AN EXTENDER IN THE OFFICE--you will see [name] NP on 05/15/22 at 3:10 pm--here at [facility]        I,Mathew Stumpf,acting as a scribe for Freada Bergeron, MD.,have documented all relevant documentation on the behalf of Freada Bergeron, MD,as directed by  Freada Bergeron, MD while in the presence of Freada Bergeron, MD.  I, Freada Bergeron, MD, have reviewed all documentation for this visit. The documentation on 02/08/22 for the exam, diagnosis, procedures, and orders are all accurate and complete.   Signed, Freada Bergeron, MD  02/08/2022 2:55 PM    Lynn

## 2022-02-08 NOTE — Patient Instructions (Addendum)
Medication Instructions:   Your physician recommends that you continue on your current medications as directed. Please refer to the Current Medication list given to you today.  *If you need a refill on your cardiac medications before your next appointment, please call your pharmacy*    Follow-Up:  3 MONTH FOLLOW-UP WITH AN EXTENDER IN THE OFFICE--you will see [name] NP on 05/15/22 at 3:10 pm--here at [facility]

## 2022-02-08 NOTE — ED Triage Notes (Signed)
Pt states abd pain x 2 days. Pt states her lower abd is the worse. Denies N/V/D. Pt states she had her first BM in 5 days today.

## 2022-02-08 NOTE — ED Provider Notes (Signed)
[facility] EMERGENCY DEPT Provider Note   CSN: [id number] Arrival date & time: 02/08/22  1542     History {Add pertinent medical, surgical, social history, OB history to HPI:1} Chief Complaint  Patient presents with   Abdominal Pain    [name] is a 87 y.o. female.  hx of dementia, CAD (BMS to RCA 2004, DESx2 to mid and proximal RCA 2012, recent NSTEMI 02/2020 s/p DES to RCA), chronic diastolic CHF, DM, morbid obesity, remote gastric bypass, PMR, HLD, depression, OCD, anxiety, COPD, prior PE/DVT, CKD stage III, breast CA  Presenting from cardiology clinic today with multiple complaints.  States she has "been sick from her head to her toes" for the past 2 weeks and getting worse.  She describes difficulty breathing and difficulty lying flat worse than baseline.  Has had lower abdominal pain and feels constipated.  Her last bowel meant was 5 days ago but went some today.  No nausea or vomiting.  Has a "heaviness in her chest" that has been constant for the past 2 weeks.  Worse with breathing.  She has no cough.  No runny nose.  No sore throat.  Has had a headache and chills but no fever.  She has had lower abdominal pain and nausea but no vomiting.  She feels constipated.  Her cardiologist sent her to the ED today with her multiple complaints and feeling sick.  She states she has been unable to rest for several days due to difficulty with her breathing and difficulty trying to lie down.  Does sleep propped up chronically  The history is provided by the patient.  Abdominal Pain Associated symptoms: constipation, fatigue, nausea and shortness of breath   Associated symptoms: no chest pain, no dysuria, no fever and no vomiting        Home Medications Prior to Admission medications   Medication Sig Start Date End Date Taking? Authorizing Provider  albuterol (PROAIR HFA) 108 (90 Base) MCG/ACT inhaler Inhale 1-2 puffs into the lungs every 6 (six) hours as needed for  wheezing or shortness of breath. 12/19/18   [name], NP  anastrozole (ARIMIDEX) 1 MG tablet TAKE 1 TABLET BY MOUTH EVERY DAY 01/11/22   [name], MD  ASPIRIN LOW DOSE 81 MG EC tablet TAKE 1 TABLET BY MOUTH EVERY DAY 04/21/21   [name], NP  atorvastatin (LIPITOR) 80 MG tablet TAKE 1 TABLET BY MOUTH EVERY DAY 01/11/22   Bonnita Hollow, MD  budesonide-formoterol [facility]) 160-4.5 MCG/ACT inhaler Inhale 2 puffs into the lungs 2 (two) times daily. 01/26/21   [name], NP  carvedilol (COREG) 12.5 MG tablet TAKE 1 TABLET BY MOUTH 2 TIMES DAILY 01/11/22   Bonnita Hollow, MD  cetirizine (ZYRTEC) 10 MG tablet Take 10 mg by mouth daily as needed for allergies or rhinitis.    [provider]  clopidogrel (PLAVIX) 75 MG tablet Take 1 tablet (75 mg total) by mouth daily. 05/26/21   Freada Bergeron, MD  Continuous Blood Gluc Sensor (FREESTYLE LIBRE 14 DAY SENSOR) MISC PLACE 1 DEVICE ON THE SKIN AS DIRECTED EVERY 14 DAYS 11/10/21   Shamleffer, [name], MD  diclofenac Sodium (VOLTAREN) 1 % GEL Apply 2 g topically 4 (four) times daily. 09/05/21   [name], NP  escitalopram (LEXAPRO) 20 MG tablet Take 1 tablet (20 mg total) by mouth daily. Take 1/2 tablet in 2 weeks, then go to whole tablet 12/05/21 03/05/22  Bonnita Hollow, MD  escitalopram (LEXAPRO) 20 MG tablet Take 1 tablet (20 mg total) by mouth daily. 12/26/21 03/26/22  Bonnita Hollow, MD  febuxostat (ULORIC) 40 MG tablet TAKE 1 TABLET BY MOUTH EVERY DAY 10/12/21   [name], NP  fluticasone (FLONASE) 50 MCG/ACT nasal spray Place 1 spray into both nostrils in the morning and at bedtime. 12/17/20   Ngetich, Dinah C, NP  folic acid (FOLVITE) 1 MG tablet TAKE 1 TABLET BY MOUTH EVERY DAY 01/11/22   Bonnita Hollow, MD  guaifenesin (ROBITUSSIN) 100 MG/5ML syrup Take 200 mg by mouth 3 (three) times daily as needed for cough.    [provider]  hydrOXYzine (ATARAX) 25 MG tablet  Take 1-2 tablets (25-50 mg total) by mouth every 8 (eight) hours as needed for anxiety or itching. 12/05/21 03/05/22  Bonnita Hollow, MD  insulin aspart (NOVOLOG FLEXPEN) 100 UNIT/ML FlexPen Inject 14 Units into the skin 3 (three) times daily before meals.    [provider]  insulin glargine, 2 Unit Dial, (TOUJEO MAX SOLOSTAR) 300 UNIT/ML Solostar Pen Inject 42 Units into the skin daily. Patient assistance program provides 05/24/21   Shamleffer, [name], MD  linaclotide [facility]) 145 MCG CAPS capsule Take 1 capsule (145 mcg total) by mouth daily before breakfast. 09/05/21   [name], NP  meclizine (ANTIVERT) 25 MG tablet TAKE 1 TABLET BY MOUTH THREE TIMES DAILY AS NEEDED FOR DIZZINESS 10/11/21   [name], NP  memantine (NAMENDA) 5 MG tablet TAKE 2 TABLETS BY MOUTH EVERY DAY and TAKE 1 TABLET BY MOUTH EVERY EVENING 09/26/21   [name], NP  nitroGLYCERIN (NITROSTAT) 0.4 MG SL tablet Take one tablet under the tongue every 5 minutes as needed for chest pain 09/24/13   [name], NP  nystatin cream (MYCOSTATIN) Apply 1 application topically 2 (two) times daily. 12/17/20   Ngetich, Dinah C, NP  OVER THE COUNTER MEDICATION Take 1 capsule by mouth every other day. Over the counter stool softener - unknown type    [provider]  oxyCODONE-acetaminophen (PERCOCET) 7.5-325 MG tablet Take 1 tablet by mouth every 12 (twelve) hours as needed for moderate pain or severe pain. Must last 30 days. 01/27/22 02/26/22  Gillis Santa, MD  pantoprazole (PROTONIX) 20 MG tablet TAKE 1 TABLET BY MOUTH EVERY DAY 12/26/21   Bonnita Hollow, MD  sodium polystyrene (SPS) 15 GM/60ML suspension take 30ms by MOUTH ONCE WEEKLY. TO keep potassium down 12/06/21   TBonnita Hollow MD  torsemide (DEMADEX) 20 MG tablet TAKE 1 TABLET BY MOUTH EVERY DAY - take an extra dose for weight gain, 3lb in 1 day or 5lbs in 1 week 01/11/22   TBonnita Hollow MD  Vitamin D, Ergocalciferol,  (DRISDOL) 1.25 MG (50000 UNIT) CAPS capsule TAKE 1 CAPSULE BY MOUTH every 7 days 12/13/21   KBrunetta Genera MD      Allergies    Sulfonamide derivatives, Lokelma [sodium zirconium cyclosilicate], Aricept [donepezil hcl], and Tramadol    Review of Systems   Review of Systems  Constitutional:  Positive for activity change, appetite change and fatigue. Negative for fever.  HENT:  Negative for congestion.   Respiratory:  Positive for chest tightness and shortness of breath.   Cardiovascular:  Negative for chest pain.  Gastrointestinal:  Positive for abdominal pain, constipation and nausea. Negative for vomiting.  Genitourinary:  Negative for dysuria.  Musculoskeletal:  Negative for arthralgias and myalgias.  Neurological:  Positive for weakness. Negative  for dizziness and headaches.   all other systems are negative except as noted in the HPI and PMH.    Physical Exam Updated Vital Signs BP (!) 119/52   Pulse 66   Temp 97.7 F (36.5 C) (Oral)   Resp 20   Ht '5\' 7"'$  (1.702 m)   Wt (!) 140.2 kg   SpO2 99%   BMI 48.40 kg/m  Physical Exam Vitals and nursing note reviewed.  Constitutional:      General: She is not in acute distress.    Appearance: She is well-developed. She is obese.     Comments: Dyspneic with conversation  HENT:     Head: Normocephalic and atraumatic.     Mouth/Throat:     Pharynx: No oropharyngeal exudate.  Eyes:     Conjunctiva/sclera: Conjunctivae normal.     Pupils: Pupils are equal, round, and reactive to light.  Neck:     Comments: No meningismus. Cardiovascular:     Rate and Rhythm: Normal rate and regular rhythm.     Heart sounds: Normal heart sounds. No murmur heard. Pulmonary:     Effort: Pulmonary effort is normal. No respiratory distress.     Breath sounds: Rales present.  Abdominal:     Palpations: Abdomen is soft.     Tenderness: There is abdominal tenderness. There is no guarding or rebound.     Comments: Diffuse lower abdominal  tenderness, no guarding or rebound  Musculoskeletal:        General: No tenderness. Normal range of motion.     Cervical back: Normal range of motion and neck supple.  Skin:    General: Skin is warm.  Neurological:     Mental Status: She is alert and oriented to person, place, and time.     Cranial Nerves: No cranial nerve deficit.     Motor: No abnormal muscle tone.     Coordination: Coordination normal.     Comments:  5/5 strength throughout. CN 2-12 intact.Equal grip strength.   Psychiatric:        Behavior: Behavior normal.     ED Results / Procedures / Treatments   Labs (all labs ordered are listed, but only abnormal results are displayed) Labs Reviewed  COMPREHENSIVE METABOLIC PANEL - Abnormal; Notable for the following components:      Result Value   Potassium 5.4 (*)    Glucose, Bld 169 (*)    BUN 67 (*)    Creatinine, Ser 2.92 (*)    AST 11 (*)    GFR, Estimated 15 (*)    All other components within normal limits  CBC - Abnormal; Notable for the following components:   RBC 3.20 (*)    Hemoglobin 9.3 (*)    HCT 29.7 (*)    All other components within normal limits  URINALYSIS, ROUTINE W REFLEX MICROSCOPIC - Abnormal; Notable for the following components:   Color, Urine STRAW (*)    Leukocytes,Ua SMALL (*)    All other components within normal limits  D-DIMER, QUANTITATIVE - Abnormal; Notable for the following components:   D-Dimer, Quant 4.74 (*)    All other components within normal limits  URINALYSIS, MICROSCOPIC (REFLEX) - Abnormal; Notable for the following components:   Bacteria, UA MANY (*)    Non Squamous Epithelial PRESENT (*)    All other components within normal limits  LIPASE, BLOOD  LACTIC ACID, PLASMA  BRAIN NATRIURETIC PEPTIDE  LACTIC ACID, PLASMA  TROPONIN I (HIGH SENSITIVITY)  TROPONIN I (HIGH SENSITIVITY)  EKG EKG Interpretation  Date/Time:  Wednesday February 08 2022 18:31:10 EDT Ventricular Rate:  64 PR Interval:  210 QRS  Duration: 95 QT Interval:  456 QTC Calculation: 471 R Axis:   16 Text Interpretation: Sinus rhythm Abnormal R-wave progression, early transition No significant change was found Confirmed by Ezequiel Essex [phone]) on 02/08/2022 6:40:20 PM  Radiology CT CHEST ABDOMEN PELVIS WO CONTRAST  Result Date: 02/08/2022 CLINICAL DATA:  Dyspnea, abdominal pain EXAM: CT CHEST, ABDOMEN AND PELVIS WITHOUT CONTRAST TECHNIQUE: Multidetector CT imaging of the chest, abdomen and pelvis was performed following the standard protocol without IV contrast. RADIATION DOSE REDUCTION: This exam was performed according to the departmental dose-optimization program which includes automated exposure control, adjustment of the mA and/or kV according to patient size and/or use of iterative reconstruction technique. COMPARISON:  CT 07/04/2021, 04/21/2014 FINDINGS: CT CHEST FINDINGS Cardiovascular: Normal heart size. No pericardial effusion. Thoracic aorta is nonaneurysmal. Atherosclerotic calcifications of the aorta and coronary arteries. Central pulmonary vasculature within normal limits. Mediastinum/Nodes: No axillary or mediastinal lymphadenopathy. No evidence of hilar adenopathy on noncontrast imaging. Trachea and esophagus demonstrate no significant findings. Incidentally noted 2.5 cm right thyroid lobe nodule. In the setting of significant comorbidities or limited life expectancy, no follow-up recommended (ref: J Am Coll Radiol. 2015 Feb;12(2): 143-50). Lungs/Pleura: Elevation of the right hemidiaphragm. Lungs are clear. No focal consolidation, pleural effusion, or pneumothorax. Musculoskeletal: No chest wall mass or suspicious bone lesions identified. Multilevel thoracic spondylosis. Severe arthropathy of the left glenohumeral joint. CT ABDOMEN PELVIS FINDINGS Hepatobiliary: No focal liver abnormality identified on noncontrast imaging. Mild pneumobilia is noted centrally. There are multiple stones within the gallbladder. No  gallbladder wall thickening or pericholecystic inflammatory changes. There is a small amount of gas within the lumen of the gallbladder. Pancreas: Unremarkable. No pancreatic ductal dilatation or surrounding inflammatory changes. Spleen: Normal in size without focal abnormality. Adrenals/Urinary Tract: Unremarkable adrenal glands. Stable size of a 2.0 cm slightly hyperdense left renal cyst, likely hemorrhagic or proteinaceous in content. Simple right renal cysts. No follow-up imaging of the renal cysts is recommended. No renal stone or hydronephrosis. Urinary bladder within normal limits for the degree of distension. Stomach/Bowel: Postsurgical changes at the GE junction. No dilated loops of bowel. No focal bowel wall thickening or inflammatory changes. Vascular/Lymphatic: Aortic atherosclerosis. No enlarged abdominal or pelvic lymph nodes. Reproductive: Uterus and bilateral adnexa are unremarkable. Other: No free fluid. No abdominopelvic fluid collection. No pneumoperitoneum. Postsurgical changes to the anterior abdominal wall. No abdominal wall hernia. Musculoskeletal: No acute or significant osseous findings. Degenerative lumbar spondylosis. Mild-moderate degenerative changes of the bilateral hips. IMPRESSION: 1. No acute abnormality within the chest, abdomen, or pelvis. 2. Cholelithiasis. 3. There is mild pneumobilia as well as a small amount of gas within the lumen of the gallbladder. Findings are nonspecific and could be related to prior sphincterotomy, sphincter of Oddi incompetence, or recently passed biliary stone. Correlate with laboratory values to exclude the possibility of a recent cholangitis. No inflammatory stranding is seen to suggest active infection/inflammation. Aortic Atherosclerosis (ICD10-I70.0). Electronically Signed   By: Davina Poke D.O.   On: 02/08/2022 18:54   DG Chest 2 View  Result Date: 02/08/2022 CLINICAL DATA:  Shortness of breath. EXAM: CHEST - 2 VIEW COMPARISON:  November 19, 2021 FINDINGS: EKG leads project over the chest. Trachea midline. Cardiomediastinal contours and hilar structures are stable with mild cardiomegaly accentuated by AP projection and portable technique. No lobar consolidation. No pneumothorax or pleural effusion. On limited assessment  no acute skeletal findings. IMPRESSION: Mild cardiomegaly without acute cardiopulmonary disease. Electronically Signed   By: Zetta Bills M.D.   On: 02/08/2022 17:35    Procedures Procedures  {Document cardiac monitor, telemetry assessment procedure when appropriate:1}  Medications Ordered in ED Medications - No data to display  ED Course/ Medical Decision Making/ A&P                           Medical Decision Making Amount and/or Complexity of Data Reviewed Labs: ordered. Decision-making details documented in ED Course. Radiology: ordered and independent interpretation performed. Decision-making details documented in ED Course. ECG/medicine tests: ordered and independent interpretation performed. Decision-making details documented in ED Course.  Risk Prescription drug management.  Patient with 2 weeks of progressively worsening chest pressure, shortness of breath, abdominal pain, constipation and generalized weakness.  She has increased work of breathing but no hypoxia.  EKG shows no acute ischemia.  Q waves inferiorly.  X-ray is concerning for edema.  Results reviewed and interpreted by me.  Creatinine worse to 2.9 from baseline in the 2 range.  D-dimer elevated.  Unable to give IV contrast to assess for CT PE  Concern for possible CHF exacerbation.  IV Lasix is given.  Creatinine is worse from baseline.  CT scan is obtained given her diffuse abdominal pain.  There is cholelithiasis without evidence of cholecystitis.  She has pneumobilia of unclear etiology.  No recent instrumentation.  No fever.  Her lipase and LFTs are normal.  Discussed with Dr. Barry Dienes of general surgery.  She will see in consult.   Agrees with IV antibiotics.  Patient may need MRCP.  She has No significant right upper quadrant pain to exam. {Document critical care time when appropriate:1} {Document review of labs and clinical decision tools ie heart score, Chads2Vasc2 etc:1}  {Document your independent review of radiology images, and any outside records:1} {Document your discussion with family members, caretakers, and with consultants:1} {Document social determinants of health affecting pt's care:1} {Document your decision making why or why not admission, treatments were needed:1} Final Clinical Impression(s) / ED Diagnoses Final diagnoses:  None    Rx / DC Orders ED Discharge Orders     None

## 2022-02-08 NOTE — Telephone Encounter (Signed)
Spoke with patient and daughter [name]. Offered patient appointment 02/09/22 with PCP at 10:20am and patient declined and stated she would call back after her cardiologist appointment today if her shoulder has gotten worse.

## 2022-02-08 NOTE — Progress Notes (Signed)
Pharmacy Antibiotic Note  [name] is a 87 y.o. female for which pharmacy has been consulted for zosyn dosing for  IAI .  Patient with a history of dementia, CAD, HF, DM, morbid obesity, remote gastric bypass, HLD, depression, OCD, anxiety, COPD, prior PE/DVT, CKD, breast CA. Patient presenting with abdominal pain.  SCr 2.92 WBC 8.9; LA 1.3; T afeb; HR 63; RR 20  Plan: Zosyn 3.375g IV q8h (4 hour infusion) --CrCl borderline for adjustment Trend WBC, Fever, Renal function F/u cultures, clinical course, WBC, fever De-escalate when able  Height: '5\' 7"'$  (170.2 cm) Weight: (!) 140.2 kg (309 lb) IBW/kg (Calculated) : 61.6  Temp (24hrs), Avg:97.7 F (36.5 C), Min:97.7 F (36.5 C), Max:97.7 F (36.5 C)  Recent Labs  Lab 02/08/22 1557 02/08/22 1708  WBC 8.9  --   CREATININE 2.92*  --   LATICACIDVEN  --  1.3    Estimated Creatinine Clearance: 20.7 mL/min (A) (by C-G formula based on SCr of 2.92 mg/dL (H)).    Allergies  Allergen Reactions   Sulfonamide Derivatives Swelling and Other (See Comments)    Mouth swelling- no resp issues noted   Lokelma [Sodium Zirconium Cyclosilicate] Other (See Comments)    Headache, stomach upset   Aricept [Donepezil Hcl] Diarrhea and Nausea And Vomiting    GI upset/loose stools   Tramadol Nausea And Vomiting    Antimicrobials this admission: zosyn 10/4 >>   Microbiology results: Pending  Thank you for allowing pharmacy to be a part of this patient's care.  Lorelei Pont, PharmD, BCPS 02/08/2022 7:15 PM ED Clinical Pharmacist -  [phone]

## 2022-02-08 NOTE — ED Notes (Signed)
Spoke with lab to add BNP and Trop.

## 2022-02-09 ENCOUNTER — Ambulatory Visit: Payer: HMO | Admitting: Family Medicine

## 2022-02-09 ENCOUNTER — Encounter: Payer: Self-pay | Admitting: Hematology

## 2022-02-09 ENCOUNTER — Encounter (HOSPITAL_COMMUNITY): Payer: Self-pay | Admitting: Family Medicine

## 2022-02-09 ENCOUNTER — Other Ambulatory Visit: Payer: Self-pay | Admitting: Hematology

## 2022-02-09 ENCOUNTER — Other Ambulatory Visit: Payer: Self-pay | Admitting: Family Medicine

## 2022-02-09 ENCOUNTER — Other Ambulatory Visit: Payer: Self-pay

## 2022-02-09 DIAGNOSIS — I5032 Chronic diastolic (congestive) heart failure: Secondary | ICD-10-CM | POA: Diagnosis not present

## 2022-02-09 DIAGNOSIS — Z853 Personal history of malignant neoplasm of breast: Secondary | ICD-10-CM | POA: Diagnosis not present

## 2022-02-09 DIAGNOSIS — R7989 Other specified abnormal findings of blood chemistry: Secondary | ICD-10-CM | POA: Diagnosis present

## 2022-02-09 DIAGNOSIS — M109 Gout, unspecified: Secondary | ICD-10-CM | POA: Diagnosis not present

## 2022-02-09 DIAGNOSIS — I252 Old myocardial infarction: Secondary | ICD-10-CM | POA: Diagnosis not present

## 2022-02-09 DIAGNOSIS — I130 Hypertensive heart and chronic kidney disease with heart failure and stage 1 through stage 4 chronic kidney disease, or unspecified chronic kidney disease: Secondary | ICD-10-CM | POA: Diagnosis not present

## 2022-02-09 DIAGNOSIS — R1030 Lower abdominal pain, unspecified: Secondary | ICD-10-CM | POA: Diagnosis present

## 2022-02-09 DIAGNOSIS — E1122 Type 2 diabetes mellitus with diabetic chronic kidney disease: Secondary | ICD-10-CM | POA: Diagnosis not present

## 2022-02-09 DIAGNOSIS — F32A Depression, unspecified: Secondary | ICD-10-CM | POA: Diagnosis not present

## 2022-02-09 DIAGNOSIS — Z9884 Bariatric surgery status: Secondary | ICD-10-CM | POA: Diagnosis not present

## 2022-02-09 DIAGNOSIS — K838 Other specified diseases of biliary tract: Secondary | ICD-10-CM | POA: Diagnosis not present

## 2022-02-09 DIAGNOSIS — N184 Chronic kidney disease, stage 4 (severe): Secondary | ICD-10-CM | POA: Diagnosis not present

## 2022-02-09 DIAGNOSIS — F0394 Unspecified dementia, unspecified severity, with anxiety: Secondary | ICD-10-CM | POA: Diagnosis not present

## 2022-02-09 DIAGNOSIS — E860 Dehydration: Secondary | ICD-10-CM | POA: Diagnosis not present

## 2022-02-09 DIAGNOSIS — L821 Other seborrheic keratosis: Secondary | ICD-10-CM | POA: Insufficient documentation

## 2022-02-09 DIAGNOSIS — E1169 Type 2 diabetes mellitus with other specified complication: Secondary | ICD-10-CM | POA: Diagnosis not present

## 2022-02-09 DIAGNOSIS — Z66 Do not resuscitate: Secondary | ICD-10-CM | POA: Diagnosis not present

## 2022-02-09 DIAGNOSIS — Z86711 Personal history of pulmonary embolism: Secondary | ICD-10-CM | POA: Diagnosis not present

## 2022-02-09 DIAGNOSIS — D509 Iron deficiency anemia, unspecified: Secondary | ICD-10-CM | POA: Diagnosis not present

## 2022-02-09 DIAGNOSIS — G894 Chronic pain syndrome: Secondary | ICD-10-CM | POA: Diagnosis not present

## 2022-02-09 DIAGNOSIS — Z86718 Personal history of other venous thrombosis and embolism: Secondary | ICD-10-CM | POA: Diagnosis not present

## 2022-02-09 DIAGNOSIS — Z794 Long term (current) use of insulin: Secondary | ICD-10-CM | POA: Diagnosis not present

## 2022-02-09 DIAGNOSIS — E785 Hyperlipidemia, unspecified: Secondary | ICD-10-CM | POA: Diagnosis not present

## 2022-02-09 DIAGNOSIS — Z6841 Body Mass Index (BMI) 40.0 and over, adult: Secondary | ICD-10-CM | POA: Diagnosis not present

## 2022-02-09 DIAGNOSIS — Z79899 Other long term (current) drug therapy: Secondary | ICD-10-CM | POA: Diagnosis not present

## 2022-02-09 DIAGNOSIS — Z955 Presence of coronary angioplasty implant and graft: Secondary | ICD-10-CM | POA: Diagnosis not present

## 2022-02-09 LAB — COMPREHENSIVE METABOLIC PANEL
ALT: 6 U/L (ref 0–44)
AST: 10 U/L — ABNORMAL LOW (ref 15–41)
Albumin: 3.5 g/dL (ref 3.5–5.0)
Alkaline Phosphatase: 75 U/L (ref 38–126)
Anion gap: 12 (ref 5–15)
BUN: 67 mg/dL — ABNORMAL HIGH (ref 8–23)
CO2: 26 mmol/L (ref 22–32)
Calcium: 9.1 mg/dL (ref 8.9–10.3)
Chloride: 101 mmol/L (ref 98–111)
Creatinine, Ser: 2.78 mg/dL — ABNORMAL HIGH (ref 0.44–1.00)
GFR, Estimated: 16 mL/min — ABNORMAL LOW (ref >60)
Glucose, Bld: 129 mg/dL — ABNORMAL HIGH (ref 70–99)
Potassium: 5.1 mmol/L (ref 3.5–5.1)
Sodium: 139 mmol/L (ref 135–145)
Total Bilirubin: 0.5 mg/dL (ref 0.3–1.2)
Total Protein: 7.6 g/dL (ref 6.5–8.1)

## 2022-02-09 LAB — CBC WITH DIFFERENTIAL/PLATELET
Abs Immature Granulocytes: 0.02 10*3/uL (ref 0.00–0.07)
Basophils Absolute: 0 10*3/uL (ref 0.0–0.1)
Basophils Relative: 0 %
Eosinophils Absolute: 0.4 10*3/uL (ref 0.0–0.5)
Eosinophils Relative: 4 %
HCT: 28.5 % — ABNORMAL LOW (ref 36.0–46.0)
Hemoglobin: 9 g/dL — ABNORMAL LOW (ref 12.0–15.0)
Immature Granulocytes: 0 %
Lymphocytes Relative: 26 %
Lymphs Abs: 2.7 10*3/uL (ref 0.7–4.0)
MCH: 28.8 pg (ref 26.0–34.0)
MCHC: 31.6 g/dL (ref 30.0–36.0)
MCV: 91.3 fL (ref 80.0–100.0)
Monocytes Absolute: 0.9 10*3/uL (ref 0.1–1.0)
Monocytes Relative: 8 %
Neutro Abs: 6.3 10*3/uL (ref 1.7–7.7)
Neutrophils Relative %: 62 %
Platelets: 243 10*3/uL (ref 150–400)
RBC: 3.12 MIL/uL — ABNORMAL LOW (ref 3.87–5.11)
RDW: 13.7 % (ref 11.5–15.5)
WBC: 10.3 10*3/uL (ref 4.0–10.5)
nRBC: 0 % (ref 0.0–0.2)

## 2022-02-09 LAB — GLUCOSE, CAPILLARY: Glucose-Capillary: 169 mg/dL — ABNORMAL HIGH (ref 70–99)

## 2022-02-09 MED ORDER — TRAZODONE HCL 50 MG PO TABS
50.0000 mg | ORAL_TABLET | Freq: Every evening | ORAL | Status: DC | PRN
  Administered 2022-02-09: 50 mg via ORAL
  Filled 2022-02-09: qty 1

## 2022-02-09 MED ORDER — ESCITALOPRAM OXALATE 20 MG PO TABS
20.0000 mg | ORAL_TABLET | Freq: Every day | ORAL | Status: DC
  Administered 2022-02-09 – 2022-02-12 (×4): 20 mg via ORAL
  Filled 2022-02-09: qty 2
  Filled 2022-02-09 (×3): qty 1

## 2022-02-09 MED ORDER — METRONIDAZOLE 500 MG/100ML IV SOLN
500.0000 mg | Freq: Two times a day (BID) | INTRAVENOUS | Status: DC
  Administered 2022-02-09 – 2022-02-12 (×7): 500 mg via INTRAVENOUS
  Filled 2022-02-09 (×7): qty 100

## 2022-02-09 MED ORDER — ACETAMINOPHEN 650 MG RE SUPP: 650.0000 mg | Freq: Four times a day (QID) | RECTAL | Status: DC | PRN

## 2022-02-09 MED ORDER — DIAZEPAM 2 MG PO TABS
2.0000 mg | ORAL_TABLET | Freq: Once | ORAL | Status: AC
  Administered 2022-02-09: 2 mg via ORAL
  Filled 2022-02-09: qty 1

## 2022-02-09 MED ORDER — CARVEDILOL 12.5 MG PO TABS
12.5000 mg | ORAL_TABLET | Freq: Two times a day (BID) | ORAL | Status: DC
  Administered 2022-02-09: 12.5 mg via ORAL
  Filled 2022-02-09: qty 1

## 2022-02-09 MED ORDER — MECLIZINE HCL 25 MG PO TABS: 25.0000 mg | ORAL_TABLET | Freq: Three times a day (TID) | ORAL | Status: DC | PRN

## 2022-02-09 MED ORDER — OXYCODONE-ACETAMINOPHEN 7.5-325 MG PO TABS
1.0000 | ORAL_TABLET | Freq: Two times a day (BID) | ORAL | Status: DC | PRN
  Administered 2022-02-09 – 2022-02-12 (×3): 1 via ORAL
  Filled 2022-02-09 (×3): qty 1

## 2022-02-09 MED ORDER — ONDANSETRON HCL 4 MG PO TABS: 4.0000 mg | ORAL_TABLET | Freq: Four times a day (QID) | ORAL | Status: DC | PRN

## 2022-02-09 MED ORDER — FOLIC ACID 1 MG PO TABS
1.0000 mg | ORAL_TABLET | Freq: Every day | ORAL | Status: DC
  Administered 2022-02-09 – 2022-02-12 (×4): 1 mg via ORAL
  Filled 2022-02-09 (×4): qty 1

## 2022-02-09 MED ORDER — HYDROXYZINE HCL 25 MG PO TABS: 25.0000 mg | ORAL_TABLET | Freq: Three times a day (TID) | ORAL | Status: DC | PRN

## 2022-02-09 MED ORDER — LINACLOTIDE 145 MCG PO CAPS
145.0000 ug | ORAL_CAPSULE | Freq: Every day | ORAL | Status: DC
  Administered 2022-02-10 – 2022-02-12 (×3): 145 ug via ORAL
  Filled 2022-02-09 (×3): qty 1

## 2022-02-09 MED ORDER — MEMANTINE HCL 5 MG PO TABS
10.0000 mg | ORAL_TABLET | Freq: Every day | ORAL | Status: DC
  Administered 2022-02-10 – 2022-02-12 (×3): 10 mg via ORAL
  Filled 2022-02-09 (×3): qty 2

## 2022-02-09 MED ORDER — FLUTICASONE PROPIONATE 50 MCG/ACT NA SUSP
1.0000 | Freq: Every day | NASAL | Status: DC
  Administered 2022-02-09 – 2022-02-12 (×3): 1 via NASAL
  Filled 2022-02-09 (×2): qty 16

## 2022-02-09 MED ORDER — ONDANSETRON HCL 4 MG/2ML IJ SOLN: 4.0000 mg | Freq: Four times a day (QID) | INTRAMUSCULAR | Status: DC | PRN

## 2022-02-09 MED ORDER — TORSEMIDE 20 MG PO TABS
20.0000 mg | ORAL_TABLET | Freq: Every day | ORAL | Status: DC
  Administered 2022-02-10: 20 mg via ORAL
  Filled 2022-02-09: qty 1

## 2022-02-09 MED ORDER — SODIUM CHLORIDE 0.9 % IV SOLN
2.0000 g | INTRAVENOUS | Status: DC
  Administered 2022-02-09 – 2022-02-12 (×4): 2 g via INTRAVENOUS
  Filled 2022-02-09 (×4): qty 20

## 2022-02-09 MED ORDER — LORATADINE 10 MG PO TABS
10.0000 mg | ORAL_TABLET | Freq: Every day | ORAL | Status: DC
  Administered 2022-02-09 – 2022-02-12 (×4): 10 mg via ORAL
  Filled 2022-02-09 (×4): qty 1

## 2022-02-09 MED ORDER — CLOPIDOGREL BISULFATE 75 MG PO TABS
75.0000 mg | ORAL_TABLET | Freq: Every day | ORAL | Status: DC
  Administered 2022-02-09 – 2022-02-12 (×4): 75 mg via ORAL
  Filled 2022-02-09 (×4): qty 1

## 2022-02-09 MED ORDER — GUAIFENESIN 100 MG/5ML PO LIQD: 200.0000 mg | Freq: Three times a day (TID) | ORAL | Status: DC | PRN

## 2022-02-09 MED ORDER — MEMANTINE HCL 10 MG PO TABS
5.0000 mg | ORAL_TABLET | Freq: Every day | ORAL | Status: DC
  Administered 2022-02-09 – 2022-02-11 (×3): 5 mg via ORAL
  Filled 2022-02-09 (×3): qty 1

## 2022-02-09 MED ORDER — MOMETASONE FURO-FORMOTEROL FUM 200-5 MCG/ACT IN AERO
2.0000 | INHALATION_SPRAY | Freq: Two times a day (BID) | RESPIRATORY_TRACT | Status: DC
  Administered 2022-02-10 – 2022-02-12 (×5): 2 via RESPIRATORY_TRACT
  Filled 2022-02-09: qty 8.8

## 2022-02-09 MED ORDER — FEBUXOSTAT 40 MG PO TABS
40.0000 mg | ORAL_TABLET | Freq: Every day | ORAL | Status: DC
  Administered 2022-02-10 – 2022-02-12 (×3): 40 mg via ORAL
  Filled 2022-02-09 (×3): qty 1

## 2022-02-09 MED ORDER — ACETAMINOPHEN 325 MG PO TABS: 650.0000 mg | ORAL_TABLET | Freq: Four times a day (QID) | ORAL | Status: DC | PRN

## 2022-02-09 MED ORDER — ANASTROZOLE 1 MG PO TABS
1.0000 mg | ORAL_TABLET | Freq: Every day | ORAL | Status: DC
  Administered 2022-02-10 – 2022-02-12 (×3): 1 mg via ORAL
  Filled 2022-02-09 (×3): qty 1

## 2022-02-09 MED ORDER — NYSTATIN 100000 UNIT/GM EX CREA
1.0000 | TOPICAL_CREAM | Freq: Two times a day (BID) | CUTANEOUS | Status: DC
  Administered 2022-02-09 – 2022-02-12 (×6): 1 via TOPICAL
  Filled 2022-02-09: qty 30

## 2022-02-09 MED ORDER — DICLOFENAC SODIUM 1 % EX GEL
2.0000 g | Freq: Four times a day (QID) | CUTANEOUS | Status: DC
  Administered 2022-02-09 – 2022-02-12 (×6): 2 g via TOPICAL
  Filled 2022-02-09 (×2): qty 100

## 2022-02-09 MED ORDER — ASPIRIN 81 MG PO TBEC
81.0000 mg | DELAYED_RELEASE_TABLET | Freq: Every day | ORAL | Status: DC
  Administered 2022-02-09 – 2022-02-12 (×4): 81 mg via ORAL
  Filled 2022-02-09 (×4): qty 1

## 2022-02-09 MED ORDER — ATORVASTATIN CALCIUM 40 MG PO TABS
80.0000 mg | ORAL_TABLET | Freq: Every day | ORAL | Status: DC
  Administered 2022-02-09 – 2022-02-12 (×4): 80 mg via ORAL
  Filled 2022-02-09 (×4): qty 2

## 2022-02-09 MED ORDER — PANTOPRAZOLE SODIUM 20 MG PO TBEC
20.0000 mg | DELAYED_RELEASE_TABLET | Freq: Every day | ORAL | Status: DC
  Administered 2022-02-09 – 2022-02-12 (×4): 20 mg via ORAL
  Filled 2022-02-09 (×4): qty 1

## 2022-02-09 NOTE — ED Notes (Signed)
Repositioned pt with [facility] Pt wanted to turn to left side and raise head.

## 2022-02-09 NOTE — Progress Notes (Addendum)
Full consult to follow.  Chart reviewed.  87 year old woman with multiple significant medical problems including coronary artery disease, CHF, diabetes, obesity, remote history of gastric bypass, COPD, prior PE/DVT, CKD 3, who presented with 2 weeks of progressing chest pressure, shortness of breath, constant abdominal pain, constipation and generalized weakness with increased work of breathing.  X-rays converting for edema, acute kidney injury on chronic kidney disease with creatinine of 2.9 from 2, concern for CHF exacerbation versus recurrent PE.  CT notes pneumobilia of unclear etiology, no right upper quadrant tenderness noted, no fever, no leukocytosis normal lipase and LFTs.  MRCP may be able to demonstrate whether there is some type of fistula to the biliary tract, but not clear that this will change was done as it appears that the patient is refusing any type of life-prolonging intervention, procedure or surgery.  In the absence of any significant clinical signs suggesting cholecystitis or cholangitis, would try to minimize chasing this imaging finding. Would consider palliative care consultation.

## 2022-02-09 NOTE — ED Notes (Signed)
Okayed to eat per Dr. Regenia Skeeter. Mac and cheese provided. Daughter at bedside states "my mom is sick, she has a headache and her stomach hurts". Pt insisted on eating. MD notified re: complaints of pain.

## 2022-02-09 NOTE — ED Provider Notes (Signed)
Patient and daughter have confirmed to the nurse that she would not want any type of procedures that would prolong her life such as surgery.  She is asking for something to eat.  I think is reasonable to let her eat and obey her wishes.   Sherwood Gambler, MD 02/09/22 [phone]

## 2022-02-09 NOTE — ED Notes (Signed)
Pt got up from bed to commode with 1 person assist. 3 people assist when putting her back to bed. Pt states she doesn't walk at home. She does bed to wheelchair or bed to commode transfer only.

## 2022-02-09 NOTE — H&P (Signed)
History and Physical    Patient: [name] PIR:[id number] DOB: 1934-09-08 DOA: 02/08/2022 DOS: the patient was seen and examined on 02/09/2022 PCP: Bonnita Hollow, MD  Patient coming from: Home  Chief Complaint:  Chief Complaint  Patient presents with   Abdominal Pain   History taken with the help of the patient's daughter who was clarifying and correcting her statements at times.  HPI: [name] is a 87 y.o. female with medical history significant of abdominal pain, acute kidney injury, allergy, anemia, anxiety, osteoarthritis, breast cancer, breast mass, chronic pain syndrome, stage III CKD, coronary artery disease, NSTEMI, dementia, depression, type 2 diabetes, dysphagia, fungal dermatitis, depression, gout, headache, history of DVT, history of PE, hyperkalemia, hyperlipidemia, hypertension, class III obesity with a BMI of 48.4 kg/m, cath OSA not on CPAP, osteoarthritis, polymyalgia rheumatica, vasovagal syncope, history of vertigo who presented to the emergency department with abdominal pain for the past month that gets worse post prandially.  She denied nausea, vomiting.  She has also been constipated.  No melena or hematochezia.  No flank pain, dysuria, frequency or hematuria.  In the past few weeks she has had also dyspnea with chest pressure, but no palpitations, dizziness, nausea and vomiting.  No PND, orthopnea, but occasionally has been getting lower extremity edema.  The patient also stated she has been hurting everywhere, but particularly on her left shoulder.  She does not ambulate by herself and she needs assistance with transfers.  No polyuria, polydipsia, polyphagia or blurred vision.  She also complains of being mildly anxious and having trouble with anxiety.  ED course: Initial vital signs were temperature 97.7 F, pulse 63, respiration 20, BP 104/54 mmHg O2 sat 98% on room air.  The patient received fentanyl 50 mcg IVP, was started on Zosyn and received 40 mg of  furosemide IVP.  Lab work: Urinalysis shows small leukocyte esterase with many bacteria, but only 0-5 WBC on microscopic examination.  CBC showed a white count of 8.9, hemoglobin 9.3 g/dL platelets 256.  D-dimer was 4.74.  Troponin was normal x2.  Lactic acid normal x2.  Normal lipase and BNP.  CMP showed a potassium of 5.4 mmol/L.  The rest of the electrolytes were normal.  Glucose 169, BUN 67 and creatinine 2.92 mg/dL with a GFR of 50 mL/min.  AST was 11 units/L, the rest of the LFTs were unremarkable.  Imaging: Two-view chest radiograph with mild cardiomegaly without acute cardiopulmonary disease.  CT chest/abdomen/pelvis without contrast with no acute abnormality in the chest.  There was cholelithiasis there was mild pneumobilia as well as a small amount of gas within the lumen of the gallbladder.  Findings may be related to prior if hysterectomy, sphincter of Oddi incompetence or recently passed biliary stone.  No inflammatory stranding seen to suggest active infection/inflammation.  There was aortic atherosclerosis.   Review of Systems: As mentioned in the history of present illness. All other systems reviewed and are negative. Past Medical History:  Diagnosis Date   Abdominal pain 01/26/2012   Acute kidney injury superimposed on chronic kidney disease ([place]) 12/18/2014   Allergy    takes Mucinex daily as needed   Anemia, unspecified    Anxiety    takes Clonazepam daily as needed   Anxiety associated with depression 06/08/2017   Arthritis    Breast cancer ([place])    Breast cancer screening 02/19/2014   Breast mass 10/27/2014   Chronic diastolic CHF (congestive heart failure) ([place])    takes Furosemide daily  Chronic pain syndrome 02/06/2015   CKD (chronic kidney disease), stage III ([place])    Coronary artery disease    a. s/p IMI 2004 tx with BMS to RCA;  b. s/p Promus DES to RCA 2/12 c. abnormal nuc 2016 -> cath with med rx. d. NSTEMI 02/2020  mv CAD with severe ISR in pRCA and m/dRCA lesion  both treated with DES.   Debility 08/01/2012   Dementia ([place])    Depression    takes Cymbalta daily   Diabetes mellitus    insulin daily   DOE (dyspnea on exertion) 06/24/2014   Dysphagia, unspecified(787.20) 07/31/2012   Fungal dermatitis 11/13/2007   Qualifier: Diagnosis of  By: Burnice Logan  MD, Doretha Sou    GERD (gastroesophageal reflux disease)    takes Protonix daily   Gout, unspecified    Headache    occasionally   History of blood clots    History of deep venous thrombosis 01/27/2012   History of pulmonary embolus (PE) 04/22/2014   Hyperkalemia 07/26/2017   Hyperlipidemia    takes Pravastatin daily   Hypertension    Insomnia    Joint pain    Joint swelling    Morbid obesity ([place])    Multiple benign nevi 11/15/2016   Myocardial infarction ([place]) 2004   Non-STEMI (non-ST elevated myocardial infarction) ([place]) 02/15/2020   Numbness    Obstructive sleep apnea    does not wear cpap   Osteoarthritis    Osteoarthritis    Osteoarthrosis, unspecified whether generalized or localized, lower leg    Pain in the chest 12/31/2013   Pain, chronic    Personal history of other diseases of the digestive system 12/03/2006   Qualifier: Diagnosis of  By: Burnice Logan  MD, Doretha Sou    Polymyalgia rheumatica [facility])    Presence of stent in right coronary artery    Pulmonary emboli ([place]) 01/2012   felt to need lifelong anticoagulation but Xarelto dc in 02/2020 due to need for DAPT   Situational depression 06/04/2009   Qualifier: Diagnosis of  By: Burnice Logan  MD, Doretha Sou    Syncope, vasovagal 07/04/2021   Urinary incontinence    takes Linzess daily   Vaginitis and vulvovaginitis 06/23/2016   Vertigo    hx of;was taking Meclizine if needed   Wears dentures    Past Surgical History:  Procedure Laterality Date   ABDOMINAL HYSTERECTOMY     partial   APPENDECTOMY     blood clots/legs and lungs  2013   BREAST BIOPSY Left 07/22/2014   BREAST BIOPSY Left 02/10/2013   BREAST LUMPECTOMY Left 11/05/2014    BREAST LUMPECTOMY WITH RADIOACTIVE SEED LOCALIZATION Left 11/05/2014   Procedure: LEFT BREAST LUMPECTOMY WITH RADIOACTIVE SEED LOCALIZATION;  Surgeon: [name], MD;  Location: [facility];  Service: General;  Laterality: Left;   CARDIAC CATHETERIZATION     COLONOSCOPY     CORONARY ANGIOPLASTY  2   CORONARY STENT INTERVENTION N/A 02/17/2020   Procedure: CORONARY STENT INTERVENTION;  Surgeon: Leonie Man, MD;  Location: [facility];  Service: Cardiovascular;  Laterality: N/A;   ESOPHAGOGASTRODUODENOSCOPY (EGD) WITH PROPOFOL N/A 11/07/2016   Procedure: ESOPHAGOGASTRODUODENOSCOPY (EGD) WITH PROPOFOL;  Surgeon: Gatha Mayer, MD;  Location: [facility];  Service: Endoscopy;  Laterality: N/A;   EXCISION OF SKIN TAG Right 11/05/2014   Procedure: EXCISION OF RIGHT EYELID SKIN TAG;  Surgeon: [name], MD;  Location: [facility];  Service: General;  Laterality: Right;   EYE SURGERY Bilateral  cataract    GASTRIC BYPASS  1977    reversed in 1979, [place] CATH AND CORONARY ANGIOGRAPHY N/A 02/17/2020   Procedure: LEFT HEART CATH AND CORONARY ANGIOGRAPHY;  Surgeon: Leonie Man, MD;  Location: [facility] CV [facility];  Service: Cardiovascular;  Laterality: N/A;   LEFT HEART CATHETERIZATION WITH CORONARY ANGIOGRAM N/A 06/29/2014   Procedure: LEFT HEART CATHETERIZATION WITH CORONARY ANGIOGRAM;  Surgeon: Troy Sine, MD;  Location: [facility] CATH LAB;  Service: Cardiovascular;  Laterality: N/A;   MEMBRANE PEEL Right 10/23/2018   Procedure: MEMBRANE PEEL;  Surgeon: Hurman Horn, MD;  Location: [facility];  Service: Ophthalmology;  Laterality: Right;   MI with stent placement  2004   PARS PLANA VITRECTOMY Right 10/23/2018   Procedure: PARS PLANA VITRECTOMY WITH 25 GAUGE;  Surgeon: Hurman Horn, MD;  Location: [place];  Service: Ophthalmology;  Laterality: Right;   Social History:  reports that she has never smoked. She has been exposed to tobacco smoke. She has never used smokeless  tobacco. She reports that she does not drink alcohol and does not use drugs.  Allergies  Allergen Reactions   Sulfonamide Derivatives Swelling and Other (See Comments)    Mouth swelling- no resp issues noted   Lokelma [Sodium Zirconium Cyclosilicate] Other (See Comments)    Headache, stomach upset   Aricept [Donepezil Hcl] Diarrhea and Nausea And Vomiting    GI upset/loose stools   Tramadol Nausea And Vomiting    Family History  Problem Relation Age of Onset   Breast cancer Mother 62   Heart disease Mother    Throat cancer Father    Hypertension Father    Arthritis Father    Diabetes Father    Arthritis Sister    Obesity Sister    Diabetes Sister    Heart disease Cousin    Colon cancer Neg Hx    Stomach cancer Neg Hx    Esophageal cancer Neg Hx     Prior to Admission medications   Medication Sig Start Date End Date Taking? Authorizing Provider  albuterol (PROAIR HFA) 108 (90 Base) MCG/ACT inhaler Inhale 1-2 puffs into the lungs every 6 (six) hours as needed for wheezing or shortness of breath. 12/19/18  Yes [name], NP  anastrozole (ARIMIDEX) 1 MG tablet TAKE 1 TABLET BY MOUTH EVERY DAY 01/11/22  Yes [name], MD  ASPIRIN LOW DOSE 81 MG EC tablet TAKE 1 TABLET BY MOUTH EVERY DAY 04/21/21  Yes [name], NP  atorvastatin (LIPITOR) 80 MG tablet TAKE 1 TABLET BY MOUTH EVERY DAY 01/11/22  Yes Bonnita Hollow, MD  budesonide-formoterol [facility]) 160-4.5 MCG/ACT inhaler Inhale 2 puffs into the lungs 2 (two) times daily. 01/26/21  Yes [name], NP  carvedilol (COREG) 12.5 MG tablet TAKE 1 TABLET BY MOUTH 2 TIMES DAILY 01/11/22  Yes Bonnita Hollow, MD  cetirizine (ZYRTEC) 10 MG tablet Take 10 mg by mouth daily as needed for allergies or rhinitis.   Yes [provider]  clopidogrel (PLAVIX) 75 MG tablet Take 1 tablet (75 mg total) by mouth daily. 05/26/21  Yes Freada Bergeron, MD  escitalopram (LEXAPRO) 20 MG tablet Take 1 tablet  (20 mg total) by mouth daily. Take 1/2 tablet in 2 weeks, then go to whole tablet 12/05/21 03/05/22 Yes Bonnita Hollow, MD  febuxostat (ULORIC) 40 MG tablet TAKE 1 TABLET BY MOUTH EVERY DAY 10/12/21  Yes [name], NP  fluticasone (  FLONASE) 50 MCG/ACT nasal spray Place 1 spray into both nostrils in the morning and at bedtime. 12/17/20  Yes Ngetich, Dinah C, NP  folic acid (FOLVITE) 1 MG tablet TAKE 1 TABLET BY MOUTH EVERY DAY 01/11/22  Yes Bonnita Hollow, MD  insulin aspart (NOVOLOG FLEXPEN) 100 UNIT/ML FlexPen Inject 14 Units into the skin 3 (three) times daily before meals.   Yes [provider]  insulin glargine, 2 Unit Dial, (TOUJEO MAX SOLOSTAR) 300 UNIT/ML Solostar Pen Inject 42 Units into the skin daily. Patient assistance program provides 05/24/21  Yes Shamleffer, [name], MD  linaclotide [facility]) 145 MCG CAPS capsule Take 1 capsule (145 mcg total) by mouth daily before breakfast. 09/05/21  Yes [name], NP  meclizine (ANTIVERT) 25 MG tablet TAKE 1 TABLET BY MOUTH THREE TIMES DAILY AS NEEDED FOR DIZZINESS 10/11/21  Yes [name], NP  oxyCODONE-acetaminophen (PERCOCET) 7.5-325 MG tablet Take 1 tablet by mouth every 12 (twelve) hours as needed for moderate pain or severe pain. Must last 30 days. 01/27/22 02/26/22 Yes Gillis Santa, MD  sodium polystyrene (SPS) 15 GM/60ML suspension take 61ms by MOUTH ONCE WEEKLY. TO keep potassium down 12/06/21  Yes TBonnita Hollow MD  Vitamin D, Ergocalciferol, (DRISDOL) 1.25 MG (50000 UNIT) CAPS capsule TAKE 1 CAPSULE BY MOUTH every 7 days 12/13/21  Yes KBrunetta Genera MD  Continuous Blood Gluc Sensor (FREESTYLE LIBRE 14 DAY SENSOR) MISC PLACE 1 DEVICE ON THE SKIN AS DIRECTED EVERY 14 DAYS 11/10/21   Shamleffer, IMelanie Crazier MD  diclofenac Sodium (VOLTAREN) 1 % GEL Apply 2 g topically 4 (four) times daily. 09/05/21   ELauree Chandler NP  escitalopram (LEXAPRO) 20 MG tablet Take 1 tablet (20 mg total) by mouth  daily. 12/26/21 03/26/22  TBonnita Hollow MD  guaifenesin (ROBITUSSIN) 100 MG/5ML syrup Take 200 mg by mouth 3 (three) times daily as needed for cough.    [provider]  hydrOXYzine (ATARAX) 25 MG tablet Take 1-2 tablets (25-50 mg total) by mouth every 8 (eight) hours as needed for anxiety or itching. 12/05/21 03/05/22  TBonnita Hollow MD  memantine (NAMENDA) 5 MG tablet TAKE 2 TABLETS BY MOUTH EVERY DAY and TAKE 1 TABLET BY MOUTH EVERY EVENING 09/26/21   ELauree Chandler NP  nitroGLYCERIN (NITROSTAT) 0.4 MG SL tablet Take one tablet under the tongue every 5 minutes as needed for chest pain 09/24/13   ELauree Chandler NP  nystatin cream (MYCOSTATIN) Apply 1 application topically 2 (two) times daily. 12/17/20   Ngetich, Dinah C, NP  OVER THE COUNTER MEDICATION Take 1 capsule by mouth every other day. Over the counter stool softener - unknown type    [provider]  pantoprazole (PROTONIX) 20 MG tablet TAKE 1 TABLET BY MOUTH EVERY DAY 12/26/21   TBonnita Hollow MD  torsemide (DEMADEX) 20 MG tablet TAKE 1 TABLET BY MOUTH EVERY DAY. Take an extra dose for weight gain, 3lb in 1 day or 5lbs in 1 week 02/09/22   TBonnita Hollow MD    Physical Exam: Vitals:   02/09/22 1445 02/09/22 1457 02/09/22 1500 02/09/22 1644  BP:   123/72 (!) 91/53  Pulse: 79  76 69  Resp: '16  14 16  '$ Temp:  98.2 F (36.8 C)    TempSrc:  Oral  Oral  SpO2: 100%  100% 94%  Weight:      Height:       Physical Exam Vitals and nursing note reviewed.  Constitutional:      General: She is awake. She is not in acute distress.    Appearance: She is well-developed. She is obese. She is ill-appearing. She is not diaphoretic.     Comments: Chronically ill-appearing.  HENT:     Head: Normocephalic.     Nose: No rhinorrhea.     Mouth/Throat:     Mouth: Mucous membranes are dry.  Eyes:     General: No scleral icterus.    Pupils: Pupils are equal, round, and reactive to light.  Neck:     Vascular:  No JVD.  Cardiovascular:     Rate and Rhythm: Normal rate and regular rhythm.     Heart sounds: S1 normal and S2 normal.  Pulmonary:     Effort: Pulmonary effort is normal. No respiratory distress.     Breath sounds: No wheezing or rhonchi.  Abdominal:     General: Abdomen is protuberant. There is no distension.     Palpations: Abdomen is soft.     Tenderness: There is abdominal tenderness in the right upper quadrant and epigastric area. There is no right CVA tenderness, left CVA tenderness, guarding or rebound.  Musculoskeletal:     Cervical back: Neck supple.     Right lower leg: No edema.     Left lower leg: No edema.  Skin:    General: Skin is warm and dry.  Neurological:     General: No focal deficit present.     Mental Status: She is alert and oriented to person, place, and time.  Psychiatric:        Mood and Affect: Mood normal.        Behavior: Behavior normal. Behavior is cooperative.    Data Reviewed:  There are no new results to review at this time.  Assessment and Plan: Principal Problem:   Pneumobilia Observation/telemetry. Analgesics as needed.   Antiemetics as needed. Discontinue Zosyn. Begin ceftriaxone 2 g IVPB daily. Begin metronidazole 500 mg IVPB every 12 hours. General surgery will see in AM. They recommended GI evaluation for possible ERCP.  Active Problems:   Positive D dimer We will check VQ scan in AM. Anticoagulation deferred high risk for bleeding.    CKD (chronic kidney disease), stage IV (HCC) Monitor renal function electrolytes.    Essential hypertension Currently on torsemide 20 mg p.o. daily. Monitor BP, renal function electrolytes.    COPD with chronic bronchitis Supplemental oxygen as needed. Bronchodilators as needed.    Type 2 diabetes mellitus with hyperlipidemia (HCC) Currently on clear liquid diet. CBG monitoring before meals and bedtime.    Anxiety Continue escitalopram 20 mg p.o. daily.     Hyperlipidemia Continue atorvastatin 80 mg p.o. daily.    Class 3 obesity (HCC) BMI 48.4 kg/m. Lifestyle modifications.    Chronic diastolic congestive heart failure (HCC) No signs of decompensation. Continue torsemide 20 mg p.o. daily.    Gout Continue febuxostat 40 mg p.o. daily.    GERD with esophagitis Continue pantoprazole 20 mg p.o. daily.    Dementia (HCC) Continue Namenda 10 mg p.o. daily. Continue Namenda 5 mg p.o. bedtime.    Iron deficiency anemia Monitor hematocrit and hemoglobin. If tolerated may benefit from ferrous sulfate supplementation.   Advance Care Planning:   Code Status: DNR   Consults: General surgery Romana Juniper, MD).  Family Communication:   Severity of Illness: The appropriate patient status for this patient is OBSERVATION. Observation status is judged to be reasonable and necessary in order to  provide the required intensity of service to ensure the patient's safety. [name] presenting symptoms, physical exam findings, and initial radiographic and laboratory data in the context of their medical condition is felt to place them at decreased risk for further clinical deterioration. Furthermore, it is anticipated that the patient will be medically stable for discharge from the hospital within 2 midnights of admission.   Author: Reubin Milan, MD 02/09/2022 5:18 PM  For on call review [email or web address].   This document was prepared using Dragon voice recognition software and may contain some unintended transcription errors.

## 2022-02-09 NOTE — ED Notes (Signed)
Informed MD that pt wants something to eat. RN explained the NPO order and surgery consult. Per patient, she is refusing ANY kind of surgeries and states "I do not want anything to prolong my life". Daughter at the bedside.

## 2022-02-10 ENCOUNTER — Other Ambulatory Visit (HOSPITAL_COMMUNITY): Payer: Self-pay | Admitting: *Deleted

## 2022-02-10 ENCOUNTER — Observation Stay (HOSPITAL_COMMUNITY): Payer: HMO

## 2022-02-10 ENCOUNTER — Ambulatory Visit: Payer: PPO | Admitting: Internal Medicine

## 2022-02-10 DIAGNOSIS — Z9884 Bariatric surgery status: Secondary | ICD-10-CM | POA: Diagnosis not present

## 2022-02-10 DIAGNOSIS — G894 Chronic pain syndrome: Secondary | ICD-10-CM | POA: Diagnosis present

## 2022-02-10 DIAGNOSIS — I130 Hypertensive heart and chronic kidney disease with heart failure and stage 1 through stage 4 chronic kidney disease, or unspecified chronic kidney disease: Secondary | ICD-10-CM | POA: Diagnosis present

## 2022-02-10 DIAGNOSIS — M19012 Primary osteoarthritis, left shoulder: Secondary | ICD-10-CM | POA: Diagnosis not present

## 2022-02-10 DIAGNOSIS — F419 Anxiety disorder, unspecified: Secondary | ICD-10-CM | POA: Diagnosis not present

## 2022-02-10 DIAGNOSIS — M109 Gout, unspecified: Secondary | ICD-10-CM | POA: Diagnosis present

## 2022-02-10 DIAGNOSIS — D509 Iron deficiency anemia, unspecified: Secondary | ICD-10-CM | POA: Diagnosis present

## 2022-02-10 DIAGNOSIS — F32A Depression, unspecified: Secondary | ICD-10-CM | POA: Diagnosis present

## 2022-02-10 DIAGNOSIS — R079 Chest pain, unspecified: Secondary | ICD-10-CM | POA: Diagnosis not present

## 2022-02-10 DIAGNOSIS — F0394 Unspecified dementia, unspecified severity, with anxiety: Secondary | ICD-10-CM | POA: Diagnosis present

## 2022-02-10 DIAGNOSIS — Z86711 Personal history of pulmonary embolism: Secondary | ICD-10-CM | POA: Diagnosis not present

## 2022-02-10 DIAGNOSIS — E860 Dehydration: Secondary | ICD-10-CM | POA: Diagnosis present

## 2022-02-10 DIAGNOSIS — N184 Chronic kidney disease, stage 4 (severe): Secondary | ICD-10-CM | POA: Diagnosis present

## 2022-02-10 DIAGNOSIS — Z853 Personal history of malignant neoplasm of breast: Secondary | ICD-10-CM | POA: Diagnosis not present

## 2022-02-10 DIAGNOSIS — I5032 Chronic diastolic (congestive) heart failure: Secondary | ICD-10-CM | POA: Diagnosis present

## 2022-02-10 DIAGNOSIS — I252 Old myocardial infarction: Secondary | ICD-10-CM | POA: Diagnosis not present

## 2022-02-10 DIAGNOSIS — K8020 Calculus of gallbladder without cholecystitis without obstruction: Secondary | ICD-10-CM | POA: Diagnosis not present

## 2022-02-10 DIAGNOSIS — Z955 Presence of coronary angioplasty implant and graft: Secondary | ICD-10-CM | POA: Diagnosis not present

## 2022-02-10 DIAGNOSIS — I509 Heart failure, unspecified: Secondary | ICD-10-CM | POA: Diagnosis not present

## 2022-02-10 DIAGNOSIS — Z86718 Personal history of other venous thrombosis and embolism: Secondary | ICD-10-CM | POA: Diagnosis not present

## 2022-02-10 DIAGNOSIS — Z6841 Body Mass Index (BMI) 40.0 and over, adult: Secondary | ICD-10-CM | POA: Diagnosis not present

## 2022-02-10 DIAGNOSIS — Z79899 Other long term (current) drug therapy: Secondary | ICD-10-CM | POA: Diagnosis not present

## 2022-02-10 DIAGNOSIS — K838 Other specified diseases of biliary tract: Secondary | ICD-10-CM | POA: Diagnosis not present

## 2022-02-10 DIAGNOSIS — E1122 Type 2 diabetes mellitus with diabetic chronic kidney disease: Secondary | ICD-10-CM | POA: Diagnosis present

## 2022-02-10 DIAGNOSIS — Z66 Do not resuscitate: Secondary | ICD-10-CM | POA: Diagnosis present

## 2022-02-10 DIAGNOSIS — E785 Hyperlipidemia, unspecified: Secondary | ICD-10-CM | POA: Diagnosis present

## 2022-02-10 DIAGNOSIS — E1169 Type 2 diabetes mellitus with other specified complication: Secondary | ICD-10-CM | POA: Diagnosis present

## 2022-02-10 DIAGNOSIS — R1030 Lower abdominal pain, unspecified: Secondary | ICD-10-CM | POA: Diagnosis present

## 2022-02-10 DIAGNOSIS — Z794 Long term (current) use of insulin: Secondary | ICD-10-CM | POA: Diagnosis not present

## 2022-02-10 LAB — COMPREHENSIVE METABOLIC PANEL
ALT: 10 U/L (ref 0–44)
AST: 13 U/L — ABNORMAL LOW (ref 15–41)
Albumin: 3.1 g/dL — ABNORMAL LOW (ref 3.5–5.0)
Alkaline Phosphatase: 70 U/L (ref 38–126)
Anion gap: 8 (ref 5–15)
BUN: 68 mg/dL — ABNORMAL HIGH (ref 8–23)
CO2: 26 mmol/L (ref 22–32)
Calcium: 8.7 mg/dL — ABNORMAL LOW (ref 8.9–10.3)
Chloride: 104 mmol/L (ref 98–111)
Creatinine, Ser: 2.93 mg/dL — ABNORMAL HIGH (ref 0.44–1.00)
GFR, Estimated: 15 mL/min — ABNORMAL LOW (ref >60)
Glucose, Bld: 127 mg/dL — ABNORMAL HIGH (ref 70–99)
Potassium: 4.9 mmol/L (ref 3.5–5.1)
Sodium: 138 mmol/L (ref 135–145)
Total Bilirubin: 0.4 mg/dL (ref 0.3–1.2)
Total Protein: 7.1 g/dL (ref 6.5–8.1)

## 2022-02-10 LAB — GLUCOSE, CAPILLARY
Glucose-Capillary: 112 mg/dL — ABNORMAL HIGH (ref 70–99)
Glucose-Capillary: 137 mg/dL — ABNORMAL HIGH (ref 70–99)
Glucose-Capillary: 156 mg/dL — ABNORMAL HIGH (ref 70–99)
Glucose-Capillary: 223 mg/dL — ABNORMAL HIGH (ref 70–99)

## 2022-02-10 LAB — C DIFFICILE QUICK SCREEN W PCR REFLEX
C Diff antigen: NEGATIVE
C Diff interpretation: NOT DETECTED
C Diff toxin: NEGATIVE

## 2022-02-10 LAB — CBC
HCT: 30.2 % — ABNORMAL LOW (ref 36.0–46.0)
Hemoglobin: 9.2 g/dL — ABNORMAL LOW (ref 12.0–15.0)
MCH: 29.4 pg (ref 26.0–34.0)
MCHC: 30.5 g/dL (ref 30.0–36.0)
MCV: 96.5 fL (ref 80.0–100.0)
Platelets: 231 10*3/uL (ref 150–400)
RBC: 3.13 MIL/uL — ABNORMAL LOW (ref 3.87–5.11)
RDW: 13.8 % (ref 11.5–15.5)
WBC: 10.5 10*3/uL (ref 4.0–10.5)
nRBC: 0 % (ref 0.0–0.2)

## 2022-02-10 MED ORDER — TECHNETIUM TO 99M ALBUMIN AGGREGATED
4.4000 | Freq: Once | INTRAVENOUS | Status: AC
  Administered 2022-02-10: 4.4 via INTRAVENOUS

## 2022-02-10 NOTE — Progress Notes (Signed)
Progress Note   Patient: [name] TAV:[id number] DOB: 01-01-1935 DOA: 02/08/2022     0 DOS: the patient was seen and examined on 02/10/2022   Brief hospital course: 87 y.o. female with medical history significant of abdominal pain, acute kidney injury, allergy, anemia, anxiety, osteoarthritis, breast cancer, breast mass, chronic pain syndrome, stage III CKD, coronary artery disease, NSTEMI, dementia, depression, type 2 diabetes, dysphagia, fungal dermatitis, depression, gout, headache, history of DVT, history of PE, hyperkalemia, hyperlipidemia, hypertension, class III obesity with a BMI of 48.4 kg/m, cath OSA not on CPAP, osteoarthritis, polymyalgia rheumatica, vasovagal syncope, history of vertigo who presented to the emergency department with abdominal pain for the past month that gets worse post prandially.  She denied nausea, vomiting.  She has also been constipated.  No melena or hematochezia.  No flank pain, dysuria, frequency or hematuria.  In the past few weeks she has had also dyspnea with chest pressure, but no palpitations, dizziness, nausea and vomiting.  No PND, orthopnea, but occasionally has been getting lower extremity edema.  The patient also stated she has been hurting everywhere, but particularly on her left shoulder.  She does not ambulate by herself and she needs assistance with transfers.  No polyuria, polydipsia, polyphagia or blurred vision.  She also complains of being mildly anxious and having trouble with anxiety.   ED course: Initial vital signs were temperature 97.7 F, pulse 63, respiration 20, BP 104/54 mmHg O2 sat 98% on room air.  The patient received fentanyl 50 mcg IVP, was started on Zosyn and received 40 mg of furosemide IVP. On imaging, there was cholelithiasis there was mild pneumobilia as well as a small amount of gas within the lumen of the gallbladder.  Findings may be related to prior if hysterectomy, sphincter of Oddi incompetence or recently passed biliary  stone.  Assessment and Plan: Principal Problem:   Pneumobilia -Unclear etiology. Pt without abd pain this AM -Appreciate input by General Surgery. Pt clearly is not interested in surgical procedures. -Per General Surgery, given that pt would not intervention, Surgery does not think further imaging should be pursued in the absence of clinical concern for cholangitis or cholecystitis. Surgery has since signed off Analgesics as needed.   Antiemetics as needed. Discontinue Zosyn. Begin ceftriaxone 2 g IVPB daily. Begin metronidazole 500 mg IVPB every 12 hours. General surgery will see in AM. They recommended GI evaluation for possible ERCP.   Active Problems:   Positive D dimer VQ reviewed. No evidence of PE Anticoagulation deferred high risk for bleeding.     CKD (chronic kidney disease), stage IV (HCC) Monitor renal function electrolytes. Cr higher today to 2.93. Will hold torsemide per below     Essential hypertension On torsemide 20 mg p.o. daily. Membranes appeared somewhat dry, will hold torsemide for now Recheck bmet in AM     COPD with chronic bronchitis Supplemental oxygen as needed. Bronchodilators as needed.     Type 2 diabetes mellitus with hyperlipidemia (HCC) Currently on clear liquid diet. CBG monitoring before meals and bedtime.     Anxiety Continue escitalopram 20 mg p.o. daily.     Hyperlipidemia Continue atorvastatin 80 mg p.o. daily.     Class 3 obesity (HCC) BMI 48.4 kg/m. Lifestyle modifications.     Chronic diastolic congestive heart failure (HCC) No signs of decompensation. Will hold torsemide for now. Membranes appeared somewhat dry     Gout Continue febuxostat 40 mg p.o. daily.     GERD with esophagitis Continue  pantoprazole 20 mg p.o. daily.     Dementia (HCC) Continue Namenda 10 mg p.o. daily. Continue Namenda 5 mg p.o. bedtime.     Iron deficiency anemia Monitor hematocrit and hemoglobin. Hgb trend stable          Subjective: Denies abd pain this AM  Physical Exam: Vitals:   02/10/22 0136 02/10/22 0531 02/10/22 0929 02/10/22 1047  BP: (!) 125/49 (!) 128/53  (!) 158/48  Pulse: 71 71  72  Resp: '18 18 18   '$ Temp: 98.7 F (37.1 C) 98.8 F (37.1 C)    TempSrc: Oral Oral    SpO2: 100% 99% 99% 99%  Weight:      Height:       General exam: Awake, laying in bed, in nad Respiratory system: Normal respiratory effort, no wheezing Cardiovascular system: regular rate, s1, s2 Gastrointestinal system: Soft, nondistended, positive BS Central nervous system: CN2-12 grossly intact, strength intact Extremities: Perfused, no clubbing Skin: Normal skin turgor, no notable skin lesions seen Psychiatry: Mood normal // no visual hallucinations   Data Reviewed:  Labs reviewed: na 138, K 4.9, Cr 2.93   Family Communication: Pt in room, family not at bedside  Disposition: Status is: Observation The patient will require care spanning > 2 midnights and should be moved to inpatient because: Severity of illness  Planned Discharge Destination: Home    Author: Marylu Lund, MD 02/10/2022 4:49 PM  For on call review [email or web address].

## 2022-02-10 NOTE — TOC Initial Note (Signed)
Transition of Care [facility]) - Initial/Assessment Note    Patient Details  Name: [name] MRN: [id number] Date of Birth: December 30, 1934  Transition of Care [facility]) CM/SW Contact:    Leeroy Cha, RN Phone Number: 02/10/2022, 10:12 AM  Clinical Narrative:                  Transition of Care [facility]) Screening Note   Patient Details  Name: [name] Date of Birth: [birth year]   Transition of Care [facility]) CM/SW Contact:    Leeroy Cha, RN Phone Number: 02/10/2022, 10:12 AM    Transition of Care Department [facility]) has reviewed patient and no TOC needs have been identified at this time. We will continue to monitor patient advancement through interdisciplinary progression rounds. If new patient transition needs arise, please place a TOC consult.    Expected Discharge Plan: Home/Self Care Barriers to Discharge: Continued Medical Work up   Patient Goals and CMS Choice Patient states their goals for this hospitalization and ongoing recovery are:: to go home CMS Medicare.gov Compare Post Acute Care list provided to:: Patient    Expected Discharge Plan and Services Expected Discharge Plan: Home/Self Care   Discharge Planning Services: CM Consult   Living arrangements for the past 2 months: Single Family Home                                      Prior Living Arrangements/Services Living arrangements for the past 2 months: Single Family Home Lives with:: Self Patient language and need for interpreter reviewed:: Yes Do you feel safe going back to the place where you live?: Yes            Criminal Activity/Legal Involvement Pertinent to Current Situation/Hospitalization: No - Comment as needed  Activities of Daily Living Home Assistive Devices/Equipment: Environmental consultant (specify type), Wheelchair, CBG Meter, Bedside commode/3-in-1, Shower chair with back ADL Screening (condition at time of admission) Patient's cognitive ability adequate to safely complete daily  activities?: Yes Is the patient deaf or have difficulty hearing?: Yes Does the patient have difficulty seeing, even when wearing glasses/contacts?: No Does the patient have difficulty concentrating, remembering, or making decisions?: No Patient able to express need for assistance with ADLs?: Yes Does the patient have difficulty dressing or bathing?: Yes Independently performs ADLs?: No Communication: Independent Dressing (OT): Needs assistance Is this a change from baseline?: Pre-admission baseline Grooming: Needs assistance Is this a change from baseline?: Pre-admission baseline Feeding: Independent Bathing: Needs assistance Is this a change from baseline?: Pre-admission baseline Toileting: Needs assistance Is this a change from baseline?: Pre-admission baseline In/Out Bed: Needs assistance Is this a change from baseline?: Pre-admission baseline Walks in Home: Needs assistance Is this a change from baseline?: Pre-admission baseline Does the patient have difficulty walking or climbing stairs?: Yes Weakness of Legs: Both Weakness of Arms/Hands: Both  Permission Sought/Granted                  Emotional Assessment Appearance:: Appears stated age Attitude/Demeanor/Rapport: Engaged Affect (typically observed): Calm Orientation: : Oriented to Place, Oriented to Self, Oriented to  Time, Oriented to Situation Alcohol / Substance Use: Never Used Psych Involvement: No (comment)  Admission diagnosis:  Pneumobilia [K83.8] Acute on chronic congestive heart failure, unspecified heart failure type [facility]) [I50.9] Patient Active Problem List   Diagnosis Date Noted   Dermatosis papulosa nigra 02/09/2022   Positive D dimer 02/09/2022  CKD (chronic kidney disease), stage IV ([place]) 02/09/2022   Pneumobilia 02/08/2022   Severe episode of recurrent major depressive disorder, without psychotic features ([place]) 12/05/2021   AKI (acute kidney injury) ([place]) 11/22/2021   Acute on chronic  diastolic (congestive) heart failure ([place]) 11/19/2021   Anxiety 11/10/2021   Breast cancer ([place]) 10/22/2021   Right elbow pain 04/27/2021   Chronic pain of right hand 04/27/2021   Iron deficiency anemia 09/13/2020   Bilateral primary osteoarthritis of knee 04/12/2020   Chronic radicular lumbar pain 03/18/2020   Lumbar spondylosis 03/18/2020   Spinal stenosis, lumbar region, with neurogenic claudication 03/18/2020   Moderate nonproliferative diabetic retinopathy of both eyes without macular edema associated with type 2 diabetes mellitus ([place]) 08/27/2019   Type 2 diabetes mellitus with hyperlipidemia ([place]) 12/13/2017   GERD with esophagitis 11/15/2016   Dementia ([place]) 11/15/2016   Endometrial polyp 06/23/2016   Vitamin D deficiency 08/02/2015   Acute kidney injury superimposed on chronic kidney disease ([place]) 12/18/2014   Gout 10/27/2014   CN (constipation) 05/10/2014   COPD with chronic bronchitis 04/21/2014   Estrogen deficiency 02/19/2014   Insomnia 09/17/2012   Long term current use of anticoagulant therapy 02/02/2012   Chronic diastolic congestive heart failure ([place]) 01/29/2012   Class 3 obesity ([place]) 02/15/2009   Microcytic anemia 02/15/2009   TENSION HEADACHE 01/07/2008   Hyperlipidemia 12/03/2006   Essential hypertension 12/03/2006   PCP:  Bonnita Hollow, MD Pharmacy:   [facility] [address] [name] [address] Phone: [phone] Fax: [phone]  [place], [place] - [address] [place] [address] Phone: [phone] Fax: [phone]     Social Determinants of Health (SDOH) Interventions    Readmission Risk Interventions   Row Labels 11/24/2021   10:03 AM 10/20/2021    3:50 PM 02/18/2020   11:52 AM  Readmission Risk Prevention Plan   Section Header. No data exists in this row.     Transportation Screening   Complete Complete Complete   Medication Review Press photographer)   Complete Complete Complete  PCP or Specialist appointment within 3-5 days of discharge   Complete Complete Complete  HRI or [place]   Complete Complete Complete  SW Recovery Care/Counseling Consult   Complete Complete Complete  Palliative Care Screening   Not Applicable Not Applicable Not [place]   Not Applicable Not Applicable Not Applicable

## 2022-02-10 NOTE — Progress Notes (Deleted)
Name: [name]  Age/ Sex: 87 y.o., female   MRN/ DOB: [id number], 03/14/1935     PCP: Bonnita Hollow, MD   Reason for Endocrinology Evaluation: Type 2 Diabetes Mellitus     Initial Endocrinology Clinic Visit: 08/19/2018    PATIENT IDENTIFIER: [name] is a 87 y.o. female with a past medical history of T2DM, CKD III, and chronic pain syndrome . The patient has followed with Endocrinology clinic since 08/19/2018 for consultative assistance with management of her diabetes.  DIABETIC HISTORY:  [name] was diagnosed with T2DM > 40 yrs ago. Pt is a poor historian and unable to recall oral glycemic agents. [name] has been on insulin therapy for years. Her hemoglobin A1c has ranged from 6.7%  in 07/2017, peaking at 12.1% in 2018.  SUBJECTIVE:   During the last visit (04/06/2021): A1c 8.5 %    Today (02/10/2022): [name] is here for a follow up visit on diabetes management. [name] is accompanied by her sister. They use the CGM at home . [name] does forget to take prandial insulin    Had an NSTEMI 02/2020 . S/P DES PCI   Denies nausea or diarrhea   Has been off Trulicity for a while due to cost    HOME DIABETES REGIMEN:  Decrease Toujeo 42  units daily  Change  Novolog 14 units Twith Brunch and 16 units with Supper  Trulicity 1.5  mg weekly  CF : BG- 150/30    CONTINUOUS GLUCOSE MONITORING RECORD INTERPRETATION    Dates of Recording: 11/17-11/30/2022  Sensor description:freestyle libre  Results statistics:   CGM use % of time 49  Average and SD 158/46.3  Time in range     61   %  % Time Above 180 18  % Time above 250 16  % Time Below target 4     Glycemic patterns summary: Optimal BG's over night but with severe hyperglycemia during the day   Hyperglycemic episodes  all day   Hypoglycemic episodes occurred at night  and at times during the day after a bolus   Overnight periods: optimal with rare hypoglycemia         HISTORY:  Past  Medical History:  Past Medical History:  Diagnosis Date   Abdominal pain 01/26/2012   Acute kidney injury superimposed on chronic kidney disease ([place]) 12/18/2014   Allergy    takes Mucinex daily as needed   Anemia, unspecified    Anxiety    takes Clonazepam daily as needed   Anxiety associated with depression 06/08/2017   Arthritis    Breast cancer ([place])    Breast cancer screening 02/19/2014   Breast mass 10/27/2014   Chronic diastolic CHF (congestive heart failure) ([place])    takes Furosemide daily   Chronic pain syndrome 02/06/2015   CKD (chronic kidney disease), stage III ([place])    Coronary artery disease    a. s/p IMI 2004 tx with BMS to RCA;  b. s/p Promus DES to RCA 2/12 c. abnormal nuc 2016 -> cath with med rx. d. NSTEMI 02/2020  mv CAD with severe ISR in pRCA and m/dRCA lesion both treated with DES.   Debility 08/01/2012   Dementia ([place])    Depression    takes Cymbalta daily   Diabetes mellitus    insulin daily   DOE (dyspnea on exertion) 06/24/2014   Dysphagia, unspecified(787.20) 07/31/2012   Fungal dermatitis 11/13/2007   Qualifier: Diagnosis of  By: Burnice Logan  MD, Doretha Sou    GERD (gastroesophageal reflux disease)    takes Protonix daily   Gout, unspecified    Headache    occasionally   History of blood clots    History of deep venous thrombosis 01/27/2012   History of pulmonary embolus (PE) 04/22/2014   Hyperkalemia 07/26/2017   Hyperlipidemia    takes Pravastatin daily   Hypertension    Insomnia    Joint pain    Joint swelling    Morbid obesity ([place])    Multiple benign nevi 11/15/2016   Myocardial infarction [facility]) 2004   Non-STEMI (non-ST elevated myocardial infarction) ([place]) 02/15/2020   Numbness    Obstructive sleep apnea    does not wear cpap   Osteoarthritis    Osteoarthritis    Osteoarthrosis, unspecified whether generalized or localized, lower leg    Pain in the chest 12/31/2013   Pain, chronic    Personal history of other diseases of the digestive system  12/03/2006   Qualifier: Diagnosis of  By: Burnice Logan  MD, Doretha Sou    Polymyalgia rheumatica [facility])    Presence of stent in right coronary artery    Pulmonary emboli ([place]) 01/2012   felt to need lifelong anticoagulation but Xarelto dc in 02/2020 due to need for DAPT   Situational depression 06/04/2009   Qualifier: Diagnosis of  By: Burnice Logan  MD, Doretha Sou    Syncope, vasovagal 07/04/2021   Urinary incontinence    takes Linzess daily   Vaginitis and vulvovaginitis 06/23/2016   Vertigo    hx of;was taking Meclizine if needed   Wears dentures    Past Surgical History:  Past Surgical History:  Procedure Laterality Date   ABDOMINAL HYSTERECTOMY     partial   APPENDECTOMY     blood clots/legs and lungs  2013   BREAST BIOPSY Left 07/22/2014   BREAST BIOPSY Left 02/10/2013   BREAST LUMPECTOMY Left 11/05/2014   BREAST LUMPECTOMY WITH RADIOACTIVE SEED LOCALIZATION Left 11/05/2014   Procedure: LEFT BREAST LUMPECTOMY WITH RADIOACTIVE SEED LOCALIZATION;  Surgeon: [name], MD;  Location: [place];  Service: General;  Laterality: Left;   CARDIAC CATHETERIZATION     COLONOSCOPY     CORONARY ANGIOPLASTY  2   CORONARY STENT INTERVENTION N/A 02/17/2020   Procedure: CORONARY STENT INTERVENTION;  Surgeon: Leonie Man, MD;  Location: [place] CV LAB;  Service: Cardiovascular;  Laterality: N/A;   ESOPHAGOGASTRODUODENOSCOPY (EGD) WITH PROPOFOL N/A 11/07/2016   Procedure: ESOPHAGOGASTRODUODENOSCOPY (EGD) WITH PROPOFOL;  Surgeon: Gatha Mayer, MD;  Location: [facility];  Service: Endoscopy;  Laterality: N/A;   EXCISION OF SKIN TAG Right 11/05/2014   Procedure: EXCISION OF RIGHT EYELID SKIN TAG;  Surgeon: [name], MD;  Location: [facility];  Service: General;  Laterality: Right;   EYE SURGERY Bilateral    cataract    GASTRIC BYPASS  1977    reversed in 1979, [place] CATH AND CORONARY ANGIOGRAPHY N/A 02/17/2020   Procedure: LEFT HEART CATH AND CORONARY ANGIOGRAPHY;   Surgeon: Leonie Man, MD;  Location: [place] CV LAB;  Service: Cardiovascular;  Laterality: N/A;   LEFT HEART CATHETERIZATION WITH CORONARY ANGIOGRAM N/A 06/29/2014   Procedure: LEFT HEART CATHETERIZATION WITH CORONARY ANGIOGRAM;  Surgeon: Troy Sine, MD;  Location: [facility] C[facility] LAB;  Service: Cardiovascular;  Laterality: N/A;   MEMBRANE PEEL Right 10/23/2018   Procedure: MEMBRANE PEEL;  Surgeon: Hurman Horn, MD;  Location: [place];  Service:  Ophthalmology;  Laterality: Right;   MI with stent placement  2004   PARS PLANA VITRECTOMY Right 10/23/2018   Procedure: PARS PLANA VITRECTOMY WITH 25 GAUGE;  Surgeon: Hurman Horn, MD;  Location: [facility];  Service: Ophthalmology;  Laterality: Right;   Social History:  reports that [name] has never smoked. [name] has been exposed to tobacco smoke. [name] has never used smokeless tobacco. [name] reports that [name] does not drink alcohol and does not use drugs. Family History:  Family History  Problem Relation Age of Onset   Breast cancer Mother 51   Heart disease Mother    Throat cancer Father    Hypertension Father    Arthritis Father    Diabetes Father    Arthritis Sister    Obesity Sister    Diabetes Sister    Heart disease Cousin    Colon cancer Neg Hx    Stomach cancer Neg Hx    Esophageal cancer Neg Hx      HOME MEDICATIONS: Allergies as of 02/10/2022       Reactions   Sulfonamide Derivatives Swelling, Other (See Comments)   Mouth swelling- no resp issues noted   Lokelma [sodium Zirconium Cyclosilicate] Other (See Comments)   Headache, stomach upset   Aricept [donepezil Hcl] Diarrhea, Nausea And Vomiting   GI upset/loose stools   Tramadol Nausea And Vomiting        Medication List      Notice   This visit is during an admission. Changes to the med list made in this visit will be reflected in the After Visit Summary of the admission.     PHYSICAL EXAM: VS: There were no vitals taken for this visit.   EXAM: General: Pt appears  well and is in NAD in a wheelchair   Lungs: Clear with good BS bilat with no rales, rhonchi, or wheezes  Heart: Auscultation: RRR with normal S1 and S2 Periph. circulation: no peripheral edema  Extremities:  BL LE: No pretibial edema   Mental Status: Judgment, insight: intact Mood and affect: no depression, anxiety, or agitation   DM Foot Exam 09/29/2020   The skin of the feet is intact without sores or ulcerations. The pedal pulses are 1+ on right and 1+ on left. The sensation is undetectable  to a screening 5.07, 10 gram monofilament bilaterally   DATA REVIEWED:  Lab Results  Component Value Date   HGBA1C 7.0 (H) 07/04/2021   HGBA1C 8.5 (A) 04/06/2021   HGBA1C 8.0 (A) 09/29/2020    Latest Reference Range & Units 03/09/21 13:52  Sodium 135 - 145 mmol/L 139  Potassium 3.5 - 5.1 mmol/L 5.0  Chloride 98 - 111 mmol/L 109  CO2 22 - 32 mmol/L 22  Glucose 70 - 99 mg/dL 174 (H)  BUN 8 - 23 mg/dL 45 (H)  Creatinine 0.44 - 1.00 mg/dL 2.13 (H)  Calcium 8.9 - 10.3 mg/dL 8.7 (L)  Anion gap 5 - 15  8  Alkaline Phosphatase 38 - 126 U/L 101  Albumin 3.5 - 5.0 g/dL 3.0 (L)  AST 15 - 41 U/L 10 (L)  ALT 0 - 44 U/L 8  Total Protein 6.5 - 8.1 g/dL 7.8  Total Bilirubin 0.3 - 1.2 mg/dL <0.2 (L)  GFR, Est Non African American >60 mL/min 22 (L)   ASSESSMENT / PLAN / RECOMMENDATIONS:   1)Type 2 Diabetes Mellitus, Sub-Optimally  Controlled, With Neuropathic, CKD III and macrovascular complications - Most recent A1c of 8.5 %. Goal A1c < 7.5 %.      -  A1c fluctuates  - Main barriers to diabetes care is memory loss.  - [name] has family around but no consistent presence of a single caregiver , per pt [name] received Novolog once a day , but her caregiver indicates [name] received it 2-3 times a day with meals  and I suspect this consistency is the reason for hyperglycemia during the day  - [name] has fasting hypoglycemia, will attempt to reduce basal insulin again  - [name] has not had trulicity in months due  to cost, pt was provided with assistance forms    MEDICATIONS: Decrease Toujeo 42  units daily  Change  Novolog 14 units Twith Brunch and 16 units with Supper  Trulicity 1.5  mg weekly  CF : BG- 150/30  EDUCATION / INSTRUCTIONS: BG monitoring instructions: Patient is instructed to check her blood sugars 4 times a day, before meals and bedtime. Call [place] [facility] clinic if: BG persistently < 70  I reviewed the Rule of 15 for the treatment of hypoglycemia in detail with the patient. Literature supplied.    F/U in 6 months    Signed electronically by: Mack Guise, MD  [facility] End[facility]  [facility] [address] [place], [place] [address] Phone: [phone] FAX: [phone]   CC: Bonnita Hollow, [place] [address] Phone: [phone]  Fax: [phone]  Return to Endocrinology clinic as below: Future Appointments  Date Time Provider [place]  02/10/2022 11:50 AM Jannett Schmall, [name], MD [facility] None  02/13/2022  9:00 AM [id number] None  02/16/2022  1:00 PM Reed, Tiffany L, DO [facility] None  02/27/2022  1:15 PM Marzetta Board, DPM [facility]  03/15/2022  2:00 PM [name], DPM [facility]  03/21/2022 11:00 AM Gillis Santa, MD [facility] None  05/15/2022  3:10 PM Marylu Lund., NP [facility]  06/12/2022  2:00 PM Freada Bergeron, MD [facility]  07/11/2022  1:00 PM [facility] None  07/11/2022  1:30 PM [name], MD [facility] None

## 2022-02-10 NOTE — Hospital Course (Signed)
87 y.o. female with medical history significant of abdominal pain, acute kidney injury, allergy, anemia, anxiety, osteoarthritis, breast cancer, breast mass, chronic pain syndrome, stage III CKD, coronary artery disease, NSTEMI, dementia, depression, type 2 diabetes, dysphagia, fungal dermatitis, depression, gout, headache, history of DVT, history of PE, hyperkalemia, hyperlipidemia, hypertension, class III obesity with a BMI of 48.4 kg/m, cath OSA not on CPAP, osteoarthritis, polymyalgia rheumatica, vasovagal syncope, history of vertigo who presented to the emergency department with abdominal pain for the past month that gets worse post prandially.  She denied nausea, vomiting.  She has also been constipated.  No melena or hematochezia.  No flank pain, dysuria, frequency or hematuria.  In the past few weeks she has had also dyspnea with chest pressure, but no palpitations, dizziness, nausea and vomiting.  No PND, orthopnea, but occasionally has been getting lower extremity edema.  The patient also stated she has been hurting everywhere, but particularly on her left shoulder.  She does not ambulate by herself and she needs assistance with transfers.  No polyuria, polydipsia, polyphagia or blurred vision.  She also complains of being mildly anxious and having trouble with anxiety.   ED course: Initial vital signs were temperature 97.7 F, pulse 63, respiration 20, BP 104/54 mmHg O2 sat 98% on room air.  The patient received fentanyl 50 mcg IVP, was started on Zosyn and received 40 mg of furosemide IVP. On imaging, there was cholelithiasis there was mild pneumobilia as well as a small amount of gas within the lumen of the gallbladder.  Findings may be related to prior if hysterectomy, sphincter of Oddi incompetence or recently passed biliary stone.

## 2022-02-10 NOTE — Consult Note (Addendum)
Surgical Evaluation Requesting provider: Dr. Tennis Must  Chief Complaint: Multiple  HPI: 87 year old woman with multiple medical problems as listed below who presented yesterday with multiple complaints.  On questioning this morning, she states that she has been having abdominal pain for the last month or 2, but states that it is in her pannus.  She denies any epigastric or right upper quadrant pain at any time.  Denies nausea or vomiting.  Does endorse constipation.  Also has had issues with dyspnea, chest pressure, lower extremity edema, and describes pain "in her whole body ". She has a history of gastric bypass in 1977, but states that it was reversed in 1979 due to severe diarrhea and malnutrition.  As far as I can tell on reviewing her CT scan she is correct about this. She had a CT of the chest abdomen pelvis as part of her work-up that shows cholelithiasis and mild pneumobilia as well as small amount of gas within the gallbladder lumen suggestive of either prior sphincterotomy, sphincter of Oddi incompetence or recently passed biliary stone.  No inflammatory changes to suggest active cholecystitis.  General surgery is consulted for further management. The patient states clearly that she does not desire any life prolonging procedures or interventions.   Allergies  Allergen Reactions   Sulfonamide Derivatives Swelling and Other (See Comments)    Mouth swelling- no resp issues noted   Lokelma [Sodium Zirconium Cyclosilicate] Other (See Comments)    Headache, stomach upset   Aricept [Donepezil Hcl] Diarrhea and Nausea And Vomiting    GI upset/loose stools   Tramadol Nausea And Vomiting    Past Medical History:  Diagnosis Date   Abdominal pain 01/26/2012   Acute kidney injury superimposed on chronic kidney disease ([place]) 12/18/2014   Allergy    takes Mucinex daily as needed   Anemia, unspecified    Anxiety    takes Clonazepam daily as needed   Anxiety associated with depression  06/08/2017   Arthritis    Breast cancer ([place])    Breast cancer screening 02/19/2014   Breast mass 10/27/2014   Chronic diastolic CHF (congestive heart failure) ([place])    takes Furosemide daily   Chronic pain syndrome 02/06/2015   CKD (chronic kidney disease), stage III ([place])    Coronary artery disease    a. s/p IMI 2004 tx with BMS to RCA;  b. s/p Promus DES to RCA 2/12 c. abnormal nuc 2016 -> cath with med rx. d. NSTEMI 02/2020  mv CAD with severe ISR in pRCA and m/dRCA lesion both treated with DES.   Debility 08/01/2012   Dementia ([place])    Depression    takes Cymbalta daily   Diabetes mellitus    insulin daily   DOE (dyspnea on exertion) 06/24/2014   Dysphagia, unspecified(787.20) 07/31/2012   Fungal dermatitis 11/13/2007   Qualifier: Diagnosis of  By: Burnice Logan  MD, Doretha Sou    GERD (gastroesophageal reflux disease)    takes Protonix daily   Gout, unspecified    Headache    occasionally   History of blood clots    History of deep venous thrombosis 01/27/2012   History of pulmonary embolus (PE) 04/22/2014   Hyperkalemia 07/26/2017   Hyperlipidemia    takes Pravastatin daily   Hypertension    Insomnia    Joint pain    Joint swelling    Morbid obesity ([place])    Multiple benign nevi 11/15/2016   Myocardial infarction ([place]) 2004   Non-STEMI (non-ST elevated myocardial infarction) (  [place]) 02/15/2020   Numbness    Obstructive sleep apnea    does not wear cpap   Osteoarthritis    Osteoarthritis    Osteoarthrosis, unspecified whether generalized or localized, lower leg    Pain in the chest 12/31/2013   Pain, chronic    Personal history of other diseases of the digestive system 12/03/2006   Qualifier: Diagnosis of  By: Burnice Logan  MD, Doretha Sou    Polymyalgia rheumatica [facility])    Presence of stent in right coronary artery    Pulmonary emboli ([place]) 01/2012   felt to need lifelong anticoagulation but Xarelto dc in 02/2020 due to need for DAPT   Situational depression 06/04/2009   Qualifier:  Diagnosis of  By: Burnice Logan  MD, Doretha Sou    Syncope, vasovagal 07/04/2021   Urinary incontinence    takes Linzess daily   Vaginitis and vulvovaginitis 06/23/2016   Vertigo    hx of;was taking Meclizine if needed   Wears dentures     Past Surgical History:  Procedure Laterality Date   ABDOMINAL HYSTERECTOMY     partial   APPENDECTOMY     blood clots/legs and lungs  2013   BREAST BIOPSY Left 07/22/2014   BREAST BIOPSY Left 02/10/2013   BREAST LUMPECTOMY Left 11/05/2014   BREAST LUMPECTOMY WITH RADIOACTIVE SEED LOCALIZATION Left 11/05/2014   Procedure: LEFT BREAST LUMPECTOMY WITH RADIOACTIVE SEED LOCALIZATION;  Surgeon: [name], MD;  Location: [facility];  Service: General;  Laterality: Left;   CARDIAC CATHETERIZATION     COLONOSCOPY     CORONARY ANGIOPLASTY  2   CORONARY STENT INTERVENTION N/A 02/17/2020   Procedure: CORONARY STENT INTERVENTION;  Surgeon: Leonie Man, MD;  Location: [facility];  Service: Cardiovascular;  Laterality: N/A;   ESOPHAGOGASTRODUODENOSCOPY (EGD) WITH PROPOFOL N/A 11/07/2016   Procedure: ESOPHAGOGASTRODUODENOSCOPY (EGD) WITH PROPOFOL;  Surgeon: Gatha Mayer, MD;  Location: [facility];  Service: Endoscopy;  Laterality: N/A;   EXCISION OF SKIN TAG Right 11/05/2014   Procedure: EXCISION OF RIGHT EYELID SKIN TAG;  Surgeon: [name], MD;  Location: [facility];  Service: General;  Laterality: Right;   EYE SURGERY Bilateral    cataract    GASTRIC BYPASS  1977    reversed in 1979, [place] CATH AND CORONARY ANGIOGRAPHY N/A 02/17/2020   Procedure: LEFT HEART CATH AND CORONARY ANGIOGRAPHY;  Surgeon: Leonie Man, MD;  Location: [facility];  Service: Cardiovascular;  Laterality: N/A;   LEFT HEART CATHETERIZATION WITH CORONARY ANGIOGRAM N/A 06/29/2014   Procedure: LEFT HEART CATHETERIZATION WITH CORONARY ANGIOGRAM;  Surgeon: Troy Sine, MD;  Location: [facility] C[facility] LAB;  Service: Cardiovascular;  Laterality: N/A;    MEMBRANE PEEL Right 10/23/2018   Procedure: MEMBRANE PEEL;  Surgeon: Hurman Horn, MD;  Location: [facility];  Service: Ophthalmology;  Laterality: Right;   MI with stent placement  2004   PARS PLANA VITRECTOMY Right 10/23/2018   Procedure: PARS PLANA VITRECTOMY WITH 25 GAUGE;  Surgeon: Hurman Horn, MD;  Location: [facility];  Service: Ophthalmology;  Laterality: Right;    Family History  Problem Relation Age of Onset   Breast cancer Mother 12   Heart disease Mother    Throat cancer Father    Hypertension Father    Arthritis Father    Diabetes Father    Arthritis Sister    Obesity Sister    Diabetes Sister    Heart disease Cousin    Colon cancer  Neg Hx    Stomach cancer Neg Hx    Esophageal cancer Neg Hx     Social History   Socioeconomic History   Marital status: Widowed    Spouse name: Not on file   Number of children: 3   Years of education: Not on file   Highest education level: High school graduate  Occupational History   Occupation: retired  Tobacco Use   Smoking status: Never    Passive exposure: Past   Smokeless tobacco: Never   Tobacco comments:    Both parents smoked, patient was exposed/ "Raised up in smoke, my whole life."  Vaping Use   Vaping Use: Never used  Substance and Sexual Activity   Alcohol use: No    Alcohol/week: 0.0 standard drinks of alcohol   Drug use: No   Sexual activity: Not Currently  Other Topics Concern   Not on file  Social History Narrative   Not on file   Social Determinants of Health   Financial Resource Strain: Low Risk  (10/14/2021)   Overall Financial Resource Strain (CARDIA)    Difficulty of Paying Living Expenses: Not very hard  Food Insecurity: No Food Insecurity (02/09/2022)   Hunger Vital Sign    Worried About Running Out of Food in the Last Year: Never true    Ran Out of Food in the Last Year: Never true  Transportation Needs: No Transportation Needs (02/09/2022)   PRAPARE - Hydrologist  (Medical): No    Lack of Transportation (Non-Medical): No  Physical Activity: Inactive (07/26/2017)   Exercise Vital Sign    Days of Exercise per Week: 0 days    Minutes of Exercise per Session: 0 min  Stress: Stress Concern Present (07/26/2017)   [place]    Feeling of Stress : Rather much  Social Connections: Moderately Integrated (07/26/2017)   Social Connection and Isolation Panel [NHANES]    Frequency of Communication with Friends and Family: Once a week    Frequency of Social Gatherings with Friends and Family: Three times a week    Attends Religious Services: 1 to 4 times per year    Active Member of Clubs or Organizations: Yes    Attends Archivist Meetings: 1 to 4 times per year    Marital Status: Widowed    No current facility-administered medications on file prior to encounter.   Current Outpatient Medications on File Prior to Encounter  Medication Sig Dispense Refill   albuterol (PROAIR HFA) 108 (90 Base) MCG/ACT inhaler Inhale 1-2 puffs into the lungs every 6 (six) hours as needed for wheezing or shortness of breath. 6.7 g 1   anastrozole (ARIMIDEX) 1 MG tablet TAKE 1 TABLET BY MOUTH EVERY DAY 28 tablet 1   ASPIRIN LOW DOSE 81 MG EC tablet TAKE 1 TABLET BY MOUTH EVERY DAY 30 tablet 11   atorvastatin (LIPITOR) 80 MG tablet TAKE 1 TABLET BY MOUTH EVERY DAY 90 tablet 3   budesonide-formoterol (SYMBICORT) 160-4.5 MCG/ACT inhaler Inhale 2 puffs into the lungs 2 (two) times daily. 1 each 3   carvedilol (COREG) 12.5 MG tablet TAKE 1 TABLET BY MOUTH 2 TIMES DAILY 180 tablet 1   cetirizine (ZYRTEC) 10 MG tablet Take 10 mg by mouth daily as needed for allergies or rhinitis.     clopidogrel (PLAVIX) 75 MG tablet Take 1 tablet (75 mg total) by mouth daily. 90 tablet 3   escitalopram (LEXAPRO) 20  MG tablet Take 1 tablet (20 mg total) by mouth daily. Take 1/2 tablet in 2 weeks, then go to whole tablet 90 tablet 0    febuxostat (ULORIC) 40 MG tablet TAKE 1 TABLET BY MOUTH EVERY DAY 30 tablet 5   fluticasone (FLONASE) 50 MCG/ACT nasal spray Place 1 spray into both nostrils in the morning and at bedtime. 16.1 mL 3   folic acid (FOLVITE) 1 MG tablet TAKE 1 TABLET BY MOUTH EVERY DAY 90 tablet 1   insulin aspart (NOVOLOG FLEXPEN) 100 UNIT/ML FlexPen Inject 14 Units into the skin 3 (three) times daily before meals.     insulin glargine, 2 Unit Dial, (TOUJEO MAX SOLOSTAR) 300 UNIT/ML Solostar Pen Inject 42 Units into the skin daily. Patient assistance program provides 15 mL 6   linaclotide (LINZESS) 145 MCG CAPS capsule Take 1 capsule (145 mcg total) by mouth daily before breakfast. 30 capsule 2   meclizine (ANTIVERT) 25 MG tablet TAKE 1 TABLET BY MOUTH THREE TIMES DAILY AS NEEDED FOR DIZZINESS 30 tablet 11   oxyCODONE-acetaminophen (PERCOCET) 7.5-325 MG tablet Take 1 tablet by mouth every 12 (twelve) hours as needed for moderate pain or severe pain. Must last 30 days. 60 tablet 0   sodium polystyrene (SPS) 15 GM/60ML suspension take 61ms by MOUTH ONCE WEEKLY. TO keep potassium down 240 mL 2   Continuous Blood Gluc Sensor (FREESTYLE LIBRE 14 DAY SENSOR) MISC PLACE 1 DEVICE ON THE SKIN AS DIRECTED EVERY 14 DAYS 6 each 0   diclofenac Sodium (VOLTAREN) 1 % GEL Apply 2 g topically 4 (four) times daily. 150 g 2   escitalopram (LEXAPRO) 20 MG tablet Take 1 tablet (20 mg total) by mouth daily. 90 tablet 0   guaifenesin (ROBITUSSIN) 100 MG/5ML syrup Take 200 mg by mouth 3 (three) times daily as needed for cough.     hydrOXYzine (ATARAX) 25 MG tablet Take 1-2 tablets (25-50 mg total) by mouth every 8 (eight) hours as needed for anxiety or itching. 120 tablet 0   memantine (NAMENDA) 5 MG tablet TAKE 2 TABLETS BY MOUTH EVERY DAY and TAKE 1 TABLET BY MOUTH EVERY EVENING 270 tablet 5   nitroGLYCERIN (NITROSTAT) 0.4 MG SL tablet Take one tablet under the tongue every 5 minutes as needed for chest pain 50 tablet 0   nystatin cream  (MYCOSTATIN) Apply 1 application topically 2 (two) times daily. 30 g 0   OVER THE COUNTER MEDICATION Take 1 capsule by mouth every other day. Over the counter stool softener - unknown type     pantoprazole (PROTONIX) 20 MG tablet TAKE 1 TABLET BY MOUTH EVERY DAY 30 tablet 5    Review of Systems: a complete, 10pt review of systems was completed with pertinent positives and negatives as documented in the HPI  Physical Exam: Vitals:   02/10/22 0136 02/10/22 0531  BP: (!) 125/49 (!) 128/53  Pulse: 71 71  Resp: 18 18  Temp: 98.7 F (37.1 C) 98.8 F (37.1 C)  SpO2: 100% 99%   Gen: A&Ox3, no distress  Eyes: lids and conjunctivae normal, no icterus. Pupils equally round and reactive to light.  Chest: respiratory effort is normal.  Cardiovascular: RRR Gastrointestinal: Obese, soft, nondistended, minimally tender to deep palpation in the epigastrium and right upper quadrant. No tappable mass, hepatomegaly or splenomegaly.  Well-healed midline incision. No hernia. Lymphatic: no lymphadenopathy in the neck or groin Muscoloskeletal: no clubbing or cyanosis of the fingers.  Strength is symmetrical throughout.  Range of motion of bilateral  upper and lower extremities normal without pain, crepitation or contracture. Neuro: cranial nerves grossly intact.  Sensation intact to light touch diffusely. Psych: appropriate mood and affect, normal insight/judgment intact  Skin: warm and dry      Latest Ref Rng & Units 02/10/2022    7:07 AM 02/09/2022   10:50 AM 02/08/2022    3:57 PM  CBC  WBC 4.0 - 10.5 K/uL 10.5  10.3  8.9   Hemoglobin 12.0 - 15.0 g/dL 9.2  9.0  9.3   Hematocrit 36.0 - 46.0 % 30.2  28.5  29.7   Platelets 150 - 400 K/uL 231  243  256        Latest Ref Rng & Units 02/10/2022    7:07 AM 02/09/2022   10:50 AM 02/08/2022    3:57 PM  CMP  Glucose 70 - 99 mg/dL 127  129  169   BUN 8 - 23 mg/dL 68  67  67   Creatinine 0.44 - 1.00 mg/dL 2.93  2.78  2.92   Sodium 135 - 145 mmol/L 138  139   137   Potassium 3.5 - 5.1 mmol/L 4.9  5.1  5.4   Chloride 98 - 111 mmol/L 104  101  102   CO2 22 - 32 mmol/L '26  26  26   '$ Calcium 8.9 - 10.3 mg/dL 8.7  9.1  9.2   Total Protein 6.5 - 8.1 g/dL 7.1  7.6  7.7   Total Bilirubin 0.3 - 1.2 mg/dL 0.4  0.5  0.4   Alkaline Phos 38 - 126 U/L 70  75  74   AST 15 - 41 U/L '13  10  11   '$ ALT 0 - 44 U/L '10  6  7     '$ Lab Results  Component Value Date   INR 1.2 10/23/2018   INR 2.6 05/21/2017   INR 1.5 04/23/2017   PROTIME 18.9 (A) 11/14/2016    Imaging: DG Shoulder Left  Result Date: 02/10/2022 CLINICAL DATA:  Chronic left shoulder pain without known injury. EXAM: LEFT SHOULDER - 2+ VIEW COMPARISON:  July 04, 2021. FINDINGS: There is no evidence of fracture or dislocation. Moderate degenerative changes seen involving the left glenohumeral joint. Soft tissues are unremarkable. IMPRESSION: Moderate degenerative joint disease of the left glenohumeral joint. No acute abnormality seen. Electronically Signed   By: [name] M.D.   On: 02/10/2022 08:10   CT CHEST ABDOMEN PELVIS WO CONTRAST  Result Date: 02/08/2022 CLINICAL DATA:  Dyspnea, abdominal pain EXAM: CT CHEST, ABDOMEN AND PELVIS WITHOUT CONTRAST TECHNIQUE: Multidetector CT imaging of the chest, abdomen and pelvis was performed following the standard protocol without IV contrast. RADIATION DOSE REDUCTION: This exam was performed according to the departmental dose-optimization program which includes automated exposure control, adjustment of the mA and/or kV according to patient size and/or use of iterative reconstruction technique. COMPARISON:  CT 07/04/2021, 04/21/2014 FINDINGS: CT CHEST FINDINGS Cardiovascular: Normal heart size. No pericardial effusion. Thoracic aorta is nonaneurysmal. Atherosclerotic calcifications of the aorta and coronary arteries. Central pulmonary vasculature within normal limits. Mediastinum/Nodes: No axillary or mediastinal lymphadenopathy. No evidence of hilar  adenopathy on noncontrast imaging. Trachea and esophagus demonstrate no significant findings. Incidentally noted 2.5 cm right thyroid lobe nodule. In the setting of significant comorbidities or limited life expectancy, no follow-up recommended (ref: J Am Coll Radiol. 2015 Feb;12(2): 143-50). Lungs/Pleura: Elevation of the right hemidiaphragm. Lungs are clear. No focal consolidation, pleural effusion, or pneumothorax. Musculoskeletal: No chest wall mass  or suspicious bone lesions identified. Multilevel thoracic spondylosis. Severe arthropathy of the left glenohumeral joint. CT ABDOMEN PELVIS FINDINGS Hepatobiliary: No focal liver abnormality identified on noncontrast imaging. Mild pneumobilia is noted centrally. There are multiple stones within the gallbladder. No gallbladder wall thickening or pericholecystic inflammatory changes. There is a small amount of gas within the lumen of the gallbladder. Pancreas: Unremarkable. No pancreatic ductal dilatation or surrounding inflammatory changes. Spleen: Normal in size without focal abnormality. Adrenals/Urinary Tract: Unremarkable adrenal glands. Stable size of a 2.0 cm slightly hyperdense left renal cyst, likely hemorrhagic or proteinaceous in content. Simple right renal cysts. No follow-up imaging of the renal cysts is recommended. No renal stone or hydronephrosis. Urinary bladder within normal limits for the degree of distension. Stomach/Bowel: Postsurgical changes at the GE junction. No dilated loops of bowel. No focal bowel wall thickening or inflammatory changes. Vascular/Lymphatic: Aortic atherosclerosis. No enlarged abdominal or pelvic lymph nodes. Reproductive: Uterus and bilateral adnexa are unremarkable. Other: No free fluid. No abdominopelvic fluid collection. No pneumoperitoneum. Postsurgical changes to the anterior abdominal wall. No abdominal wall hernia. Musculoskeletal: No acute or significant osseous findings. Degenerative lumbar spondylosis.  Mild-moderate degenerative changes of the bilateral hips. IMPRESSION: 1. No acute abnormality within the chest, abdomen, or pelvis. 2. Cholelithiasis. 3. There is mild pneumobilia as well as a small amount of gas within the lumen of the gallbladder. Findings are nonspecific and could be related to prior sphincterotomy, sphincter of Oddi incompetence, or recently passed biliary stone. Correlate with laboratory values to exclude the possibility of a recent cholangitis. No inflammatory stranding is seen to suggest active infection/inflammation. Aortic Atherosclerosis (ICD10-I70.0). Electronically Signed   By: Davina Poke D.O.   On: 02/08/2022 18:54   DG Chest 2 View  Result Date: 02/08/2022 CLINICAL DATA:  Shortness of breath. EXAM: CHEST - 2 VIEW COMPARISON:  November 19, 2021 FINDINGS: EKG leads project over the chest. Trachea midline. Cardiomediastinal contours and hilar structures are stable with mild cardiomegaly accentuated by AP projection and portable technique. No lobar consolidation. No pneumothorax or pleural effusion. On limited assessment no acute skeletal findings. IMPRESSION: Mild cardiomegaly without acute cardiopulmonary disease. Electronically Signed   By: Zetta Bills M.D.   On: 02/08/2022 17:35     A/P: 87 year old woman with multiple significant medical problems and multiple complaints.  She does not offer specifically anything that sounds like biliary colic or cholecystitis.  She has been afebrile, without tachycardia, and her white count and LFTs are normal.  Incidental finding of gallstones and pneumobilia with small amount of air in the gallbladder on her CT scan.  No imaging findings to support inflammatory process in the right upper quadrant.  Given that the patient does not desire any aggressive interventional treatment such as cholecystectomy or drain placement (and appears to be competent to make that decision at this time), would recommend she maintain a low-fat diet in case  this is related to biliary colic.  MRCP could be considered to evaluate for choledocholithiasis or cholecystoduodenal fistula etc. as etiology of her pneumobilia, but given that the patient would not want intervention for this I do not think that further imaging should be pursued in the absence of clinical concern for cholangitis or cholecystitis. Surgery team will sign off.  Please call if questions or concerns at any time.    Patient Active Problem List   Diagnosis Date Noted   Dermatosis papulosa nigra 02/09/2022   Positive D dimer 02/09/2022   CKD (chronic kidney disease), stage IV ([place])  02/09/2022   Pneumobilia 02/08/2022   Severe episode of recurrent major depressive disorder, without psychotic features ([place]) 12/05/2021   AKI (acute kidney injury) ([place]) 11/22/2021   Acute on chronic diastolic (congestive) heart failure ([place]) 11/19/2021   Anxiety 11/10/2021   Breast cancer ([place]) 10/22/2021   Right elbow pain 04/27/2021   Chronic pain of right hand 04/27/2021   Iron deficiency anemia 09/13/2020   Bilateral primary osteoarthritis of knee 04/12/2020   Chronic radicular lumbar pain 03/18/2020   Lumbar spondylosis 03/18/2020   Spinal stenosis, lumbar region, with neurogenic claudication 03/18/2020   Moderate nonproliferative diabetic retinopathy of both eyes without macular edema associated with type 2 diabetes mellitus ([place]) 08/27/2019   Type 2 diabetes mellitus with hyperlipidemia ([place]) 12/13/2017   GERD with esophagitis 11/15/2016   Dementia ([place]) 11/15/2016   Endometrial polyp 06/23/2016   Vitamin D deficiency 08/02/2015   Acute kidney injury superimposed on chronic kidney disease ([place]) 12/18/2014   Gout 10/27/2014   CN (constipation) 05/10/2014   COPD with chronic bronchitis 04/21/2014   Estrogen deficiency 02/19/2014   Insomnia 09/17/2012   Long term current use of anticoagulant therapy 02/02/2012   Chronic diastolic congestive heart failure ([place]) 01/29/2012   Class 3  obesity ([place]) 02/15/2009   Microcytic anemia 02/15/2009   TENSION HEADACHE 01/07/2008   Hyperlipidemia 12/03/2006   Essential hypertension 12/03/2006       Romana Juniper, MD [facility]  See AMION to contact appropriate on-call provider

## 2022-02-11 DIAGNOSIS — I5032 Chronic diastolic (congestive) heart failure: Secondary | ICD-10-CM | POA: Diagnosis not present

## 2022-02-11 DIAGNOSIS — I509 Heart failure, unspecified: Secondary | ICD-10-CM

## 2022-02-11 DIAGNOSIS — K838 Other specified diseases of biliary tract: Secondary | ICD-10-CM | POA: Diagnosis not present

## 2022-02-11 LAB — COMPREHENSIVE METABOLIC PANEL
ALT: 10 U/L (ref 0–44)
AST: 13 U/L — ABNORMAL LOW (ref 15–41)
Albumin: 3.1 g/dL — ABNORMAL LOW (ref 3.5–5.0)
Alkaline Phosphatase: 67 U/L (ref 38–126)
Anion gap: 8 (ref 5–15)
BUN: 69 mg/dL — ABNORMAL HIGH (ref 8–23)
CO2: 25 mmol/L (ref 22–32)
Calcium: 8.4 mg/dL — ABNORMAL LOW (ref 8.9–10.3)
Chloride: 105 mmol/L (ref 98–111)
Creatinine, Ser: 2.59 mg/dL — ABNORMAL HIGH (ref 0.44–1.00)
GFR, Estimated: 18 mL/min — ABNORMAL LOW (ref >60)
Glucose, Bld: 149 mg/dL — ABNORMAL HIGH (ref 70–99)
Potassium: 4.6 mmol/L (ref 3.5–5.1)
Sodium: 138 mmol/L (ref 135–145)
Total Bilirubin: 0.4 mg/dL (ref 0.3–1.2)
Total Protein: 7 g/dL (ref 6.5–8.1)

## 2022-02-11 LAB — GLUCOSE, CAPILLARY
Glucose-Capillary: 145 mg/dL — ABNORMAL HIGH (ref 70–99)
Glucose-Capillary: 215 mg/dL — ABNORMAL HIGH (ref 70–99)
Glucose-Capillary: 294 mg/dL — ABNORMAL HIGH (ref 70–99)

## 2022-02-11 LAB — CBC
HCT: 29.3 % — ABNORMAL LOW (ref 36.0–46.0)
Hemoglobin: 8.7 g/dL — ABNORMAL LOW (ref 12.0–15.0)
MCH: 28.9 pg (ref 26.0–34.0)
MCHC: 29.7 g/dL — ABNORMAL LOW (ref 30.0–36.0)
MCV: 97.3 fL (ref 80.0–100.0)
Platelets: 224 10*3/uL (ref 150–400)
RBC: 3.01 MIL/uL — ABNORMAL LOW (ref 3.87–5.11)
RDW: 13.9 % (ref 11.5–15.5)
WBC: 11.3 10*3/uL — ABNORMAL HIGH (ref 4.0–10.5)
nRBC: 0 % (ref 0.0–0.2)

## 2022-02-11 MED ORDER — SACCHAROMYCES BOULARDII 250 MG PO CAPS
250.0000 mg | ORAL_CAPSULE | Freq: Two times a day (BID) | ORAL | Status: AC
  Administered 2022-02-11 – 2022-02-12 (×2): 250 mg via ORAL
  Filled 2022-02-11 (×2): qty 1

## 2022-02-11 NOTE — TOC Progression Note (Signed)
Transition of Care [facility]) - Progression Note    Patient Details  Name: [name] MRN: [id number] Date of Birth: 04-28-1935  Transition of [facility]) CM/SW Contact  Leeroy Cha, RN Phone Number: 02/11/2022, 4:23 PM  Clinical Narrative:    1623-awaiting for p.t. to evaluated patient for possible snf placement.  Patient is refsuing to go home with either hhc or snf.  Does require an auth for snf placement. Md is aware.   Expected Discharge Plan: Home/Self Care Barriers to Discharge: Continued Medical Work up  Expected Discharge Plan and Services Expected Discharge Plan: Home/Self Care   Discharge Planning Services: CM Consult   Living arrangements for the past 2 months: Single Family Home                                       Social Determinants of Health (SDOH) Interventions    Readmission Risk Interventions   Row Labels 11/24/2021   10:03 AM 10/20/2021    3:50 PM 02/18/2020   11:52 AM  Readmission Risk Prevention Plan   Section Header. No data exists in this row.     Transportation Screening   Complete Complete Complete  Medication Review [employer])   Complete Complete Complete  PCP or Specialist appointment within 3-5 days of discharge   Complete Complete Complete  HRI or [place]   Complete Complete Complete  SW Recovery Care/Counseling Consult   Complete Complete Complete  Palliative Care Screening   Not Applicable Not Applicable Not [place]   Not Applicable Not Applicable Not Applicable

## 2022-02-11 NOTE — Progress Notes (Signed)
Progress Note   Patient: [name] ZOX:[id number] DOB: Oct 01, 1934 DOA: 02/08/2022     1 DOS: the patient was seen and examined on 02/11/2022   Brief hospital course: 87 y.o. female with medical history significant of abdominal pain, acute kidney injury, allergy, anemia, anxiety, osteoarthritis, breast cancer, breast mass, chronic pain syndrome, stage III CKD, coronary artery disease, NSTEMI, dementia, depression, type 2 diabetes, dysphagia, fungal dermatitis, depression, gout, headache, history of DVT, history of PE, hyperkalemia, hyperlipidemia, hypertension, class III obesity with a BMI of 48.4 kg/m, cath OSA not on CPAP, osteoarthritis, polymyalgia rheumatica, vasovagal syncope, history of vertigo who presented to the emergency department with abdominal pain for the past month that gets worse post prandially.  She denied nausea, vomiting.  She has also been constipated.  No melena or hematochezia.  No flank pain, dysuria, frequency or hematuria.  In the past few weeks she has had also dyspnea with chest pressure, but no palpitations, dizziness, nausea and vomiting.  No PND, orthopnea, but occasionally has been getting lower extremity edema.  The patient also stated she has been hurting everywhere, but particularly on her left shoulder.  She does not ambulate by herself and she needs assistance with transfers.  No polyuria, polydipsia, polyphagia or blurred vision.  She also complains of being mildly anxious and having trouble with anxiety.   ED course: Initial vital signs were temperature 97.7 F, pulse 63, respiration 20, BP 104/54 mmHg O2 sat 98% on room air.  The patient received fentanyl 50 mcg IVP, was started on Zosyn and received 40 mg of furosemide IVP. On imaging, there was cholelithiasis there was mild pneumobilia as well as a small amount of gas within the lumen of the gallbladder.  Findings may be related to prior if hysterectomy, sphincter of Oddi incompetence or recently passed biliary  stone.  Assessment and Plan: Principal Problem:   Pneumobilia -Unclear etiology. Pt without abd pain this AM -Appreciate input by General Surgery. Pt clearly is not interested in surgical procedures. -Per General Surgery, given that pt would not intervention, Surgery does not think further imaging should be pursued in the absence of clinical concern for cholangitis or cholecystitis. Surgery has since signed off Analgesics as needed.   Antiemetics as needed. Discontinue Zosyn. On empiric ceftriaxone 2 g IVPB daily and flagyl   Active Problems:   Positive D dimer VQ reviewed. No evidence of PE Anticoagulation deferred high risk for bleeding.     CKD (chronic kidney disease), stage IV (HCC) Monitor renal function electrolytes. Cr down to 2.59 Membranes seem dry this AM, torsemide currently on hold     Essential hypertension On torsemide 20 mg p.o. daily. Membranes appeared somewhat dry, currently holding torsemide for now Recheck bmet in AM     COPD with chronic bronchitis Supplemental oxygen as needed. Bronchodilators as needed.     Type 2 diabetes mellitus with hyperlipidemia (HCC) Currently on clear liquid diet. CBG monitoring before meals and bedtime.     Anxiety Continue escitalopram 20 mg p.o. daily.     Hyperlipidemia Continue atorvastatin 80 mg p.o. daily.     Class 3 obesity (HCC) BMI 48.4 kg/m. Lifestyle modifications.     Chronic diastolic congestive heart failure (HCC) No signs of decompensation. Currently holding torsemide for now. Membranes appeared somewhat dry     Gout Continue febuxostat 40 mg p.o. daily.     GERD with esophagitis Continue pantoprazole 20 mg p.o. daily.     Dementia (HCC) Continue Namenda 10 mg  p.o. daily. Continue Namenda 5 mg p.o. bedtime.     Iron deficiency anemia Monitor hematocrit and hemoglobin. Hgb trend stable       Subjective: States abd not tender this AM, tolerating regular diet  Physical Exam: Vitals:    02/10/22 2052 02/11/22 0537 02/11/22 0916 02/11/22 1349  BP: (!) 125/49 (!) 124/52  (!) 125/53  Pulse: 79 76  75  Resp: 18 18    Temp: 98.6 F (37 C) 98.3 F (36.8 C)  98.1 F (36.7 C)  TempSrc: Oral Oral  Oral  SpO2: 96% 94% 98% 97%  Weight:      Height:       General exam: Conversant, in no acute distress Respiratory system: normal chest rise, clear, no audible wheezing Cardiovascular system: regular rhythm, s1-s2 Gastrointestinal system: Nondistended, nontender, pos BS Central nervous system: No seizures, no tremors Extremities: No cyanosis, no joint deformities Skin: No rashes, no pallor Psychiatry: Affect normal // no auditory hallucinations   Data Reviewed:  Labs reviewed: na 138, K 4.6, Cr 2.59   Family Communication: Pt in room, family at bedside  Disposition: Status is: Inpatient Continue inpatient stay because: Severity of illness  Planned Discharge Destination: Home    Author: Marylu Lund, MD 02/11/2022 5:09 PM  For on call review [email or web address].

## 2022-02-12 DIAGNOSIS — K838 Other specified diseases of biliary tract: Secondary | ICD-10-CM | POA: Diagnosis not present

## 2022-02-12 DIAGNOSIS — F419 Anxiety disorder, unspecified: Secondary | ICD-10-CM | POA: Diagnosis not present

## 2022-02-12 DIAGNOSIS — N184 Chronic kidney disease, stage 4 (severe): Secondary | ICD-10-CM | POA: Diagnosis not present

## 2022-02-12 LAB — COMPREHENSIVE METABOLIC PANEL
ALT: 10 U/L (ref 0–44)
AST: 13 U/L — ABNORMAL LOW (ref 15–41)
Albumin: 2.9 g/dL — ABNORMAL LOW (ref 3.5–5.0)
Alkaline Phosphatase: 65 U/L (ref 38–126)
Anion gap: 8 (ref 5–15)
BUN: 66 mg/dL — ABNORMAL HIGH (ref 8–23)
CO2: 24 mmol/L (ref 22–32)
Calcium: 8.7 mg/dL — ABNORMAL LOW (ref 8.9–10.3)
Chloride: 108 mmol/L (ref 98–111)
Creatinine, Ser: 2.11 mg/dL — ABNORMAL HIGH (ref 0.44–1.00)
GFR, Estimated: 23 mL/min — ABNORMAL LOW (ref >60)
Glucose, Bld: 191 mg/dL — ABNORMAL HIGH (ref 70–99)
Potassium: 4.7 mmol/L (ref 3.5–5.1)
Sodium: 140 mmol/L (ref 135–145)
Total Bilirubin: 0.3 mg/dL (ref 0.3–1.2)
Total Protein: 7 g/dL (ref 6.5–8.1)

## 2022-02-12 LAB — CBC
HCT: 27.6 % — ABNORMAL LOW (ref 36.0–46.0)
Hemoglobin: 8.4 g/dL — ABNORMAL LOW (ref 12.0–15.0)
MCH: 29.2 pg (ref 26.0–34.0)
MCHC: 30.4 g/dL (ref 30.0–36.0)
MCV: 95.8 fL (ref 80.0–100.0)
Platelets: 212 10*3/uL (ref 150–400)
RBC: 2.88 MIL/uL — ABNORMAL LOW (ref 3.87–5.11)
RDW: 13.7 % (ref 11.5–15.5)
WBC: 10.9 10*3/uL — ABNORMAL HIGH (ref 4.0–10.5)
nRBC: 0 % (ref 0.0–0.2)

## 2022-02-12 LAB — HEMOGLOBIN A1C
Hgb A1c MFr Bld: 7.1 % — ABNORMAL HIGH (ref 4.8–5.6)
Mean Plasma Glucose: 157.07 mg/dL

## 2022-02-12 LAB — GLUCOSE, CAPILLARY
Glucose-Capillary: 159 mg/dL — ABNORMAL HIGH (ref 70–99)
Glucose-Capillary: 191 mg/dL — ABNORMAL HIGH (ref 70–99)
Glucose-Capillary: 229 mg/dL — ABNORMAL HIGH (ref 70–99)
Glucose-Capillary: 248 mg/dL — ABNORMAL HIGH (ref 70–99)

## 2022-02-12 MED ORDER — CEFDINIR 300 MG PO CAPS: 300.0000 mg | ORAL_CAPSULE | Freq: Two times a day (BID) | ORAL | 0 refills | Status: AC

## 2022-02-12 MED ORDER — INSULIN ASPART 100 UNIT/ML IJ SOLN: 0.0000 [IU] | Freq: Every day | INTRAMUSCULAR | Status: DC

## 2022-02-12 MED ORDER — INSULIN ASPART 100 UNIT/ML IJ SOLN: 0.0000 [IU] | Freq: Three times a day (TID) | INTRAMUSCULAR | Status: DC

## 2022-02-12 MED ORDER — METRONIDAZOLE 500 MG PO TABS: 500.0000 mg | ORAL_TABLET | Freq: Three times a day (TID) | ORAL | 0 refills | Status: AC

## 2022-02-12 NOTE — Discharge Summary (Signed)
Physician Discharge Summary   Patient: [name] MRN: [id number] DOB: 04/23/1935  Admit date:     02/08/2022  Discharge date: 02/12/22  Discharge Physician: Marylu Lund   PCP: Bonnita Hollow, MD   Recommendations at discharge:    Follow up with PCP in 1-2 weeks Recommend repeat bmet in 1 week  Discharge Diagnoses: Principal Problem:   Pneumobilia Active Problems:   Essential hypertension   COPD with chronic bronchitis   Type 2 diabetes mellitus with hyperlipidemia (HCC)   Anxiety   Hyperlipidemia   Class 3 obesity (HCC)   Chronic diastolic congestive heart failure (HCC)   Gout   GERD with esophagitis   Dementia (HCC)   Iron deficiency anemia   Positive D dimer   CKD (chronic kidney disease), stage IV (HCC)  Resolved Problems:   * No resolved hospital problems. *  Hospital Course: 87 y.o. female with medical history significant of abdominal pain, acute kidney injury, allergy, anemia, anxiety, osteoarthritis, breast cancer, breast mass, chronic pain syndrome, stage III CKD, coronary artery disease, NSTEMI, dementia, depression, type 2 diabetes, dysphagia, fungal dermatitis, depression, gout, headache, history of DVT, history of PE, hyperkalemia, hyperlipidemia, hypertension, class III obesity with a BMI of 48.4 kg/m, cath OSA not on CPAP, osteoarthritis, polymyalgia rheumatica, vasovagal syncope, history of vertigo who presented to the emergency department with abdominal pain for the past month that gets worse post prandially.  She denied nausea, vomiting.  She has also been constipated.  No melena or hematochezia.  No flank pain, dysuria, frequency or hematuria.  In the past few weeks she has had also dyspnea with chest pressure, but no palpitations, dizziness, nausea and vomiting.  No PND, orthopnea, but occasionally has been getting lower extremity edema.  The patient also stated she has been hurting everywhere, but particularly on her left shoulder.  She does not  ambulate by herself and she needs assistance with transfers.  No polyuria, polydipsia, polyphagia or blurred vision.  She also complains of being mildly anxious and having trouble with anxiety.   ED course: Initial vital signs were temperature 97.7 F, pulse 63, respiration 20, BP 104/54 mmHg O2 sat 98% on room air.  The patient received fentanyl 50 mcg IVP, was started on Zosyn and received 40 mg of furosemide IVP. On imaging, there was cholelithiasis there was mild pneumobilia as well as a small amount of gas within the lumen of the gallbladder.  Findings may be related to prior if hysterectomy, sphincter of Oddi incompetence or recently passed biliary stone.  Assessment and Plan: Principal Problem:   Pneumobilia -Unclear etiology. Pt without abd pain this AM -Appreciate input by General Surgery. Pt clearly is not interested in surgical procedures. -Per General Surgery, given that pt would not intervention, Surgery does not think further imaging should be pursued in the absence of clinical concern for cholangitis or cholecystitis. Surgery has since signed off Analgesics as needed.   Antiemetics as needed. Discontinue Zosyn. Had been continued on empiric ceftriaxone 2 g IVPB daily and flagyl. Pt to complete course with omnicef and PO flagyl on d/c   Active Problems:   Positive D dimer VQ reviewed. No evidence of PE Anticoagulation deferred high risk for bleeding.     CKD (chronic kidney disease), stage IV (HCC) Monitor renal function electrolytes. Cr down to 2.11 Torsemide was briefly on hold     Essential hypertension On torsemide 20 mg p.o. daily. Membranes initially appeared somewhat dry, briefly held torsemide BP remained stable  COPD with chronic bronchitis Supplemental oxygen as needed. Bronchodilators as needed.     Type 2 diabetes mellitus with hyperlipidemia (HCC) Currently on clear liquid diet. CBG monitoring before meals and bedtime. Cont home meds on d/c      Anxiety Continue escitalopram 20 mg p.o. daily.     Hyperlipidemia Continue atorvastatin 80 mg p.o. daily.     Class 3 obesity (HCC) BMI 48.4 kg/m. Lifestyle modifications.     Chronic diastolic congestive heart failure (HCC) No signs of decompensation. Briefly held torsemide secondary to dehydration Resume home meds on d/c     Gout Continue febuxostat 40 mg p.o. daily.     GERD with esophagitis Continue pantoprazole 20 mg p.o. daily.     Dementia (HCC) Continue Namenda 10 mg p.o. daily. Continue Namenda 5 mg p.o. bedtime.     Iron deficiency anemia Monitor hematocrit and hemoglobin. Hgb trend stable       Consultants: General Surgery Procedures performed:   Disposition: Home Diet recommendation:  Carb modified diet DISCHARGE MEDICATION: Allergies as of 02/12/2022       Reactions   Sulfonamide Derivatives Swelling, Other (See Comments)   Mouth swelling- no resp issues noted   Lokelma [sodium Zirconium Cyclosilicate] Other (See Comments)   Headache, stomach upset   Aricept [donepezil Hcl] Diarrhea, Nausea And Vomiting   GI upset/loose stools   Tramadol Nausea And Vomiting        Medication List     TAKE these medications    albuterol 108 (90 Base) MCG/ACT inhaler Commonly known as: ProAir HFA Inhale 1-2 puffs into the lungs every 6 (six) hours as needed for wheezing or shortness of breath.   anastrozole 1 MG tablet Commonly known as: ARIMIDEX TAKE 1 TABLET BY MOUTH EVERY DAY   Aspirin Low Dose 81 MG tablet Generic drug: aspirin EC TAKE 1 TABLET BY MOUTH EVERY DAY   atorvastatin 80 MG tablet Commonly known as: LIPITOR TAKE 1 TABLET BY MOUTH EVERY DAY   budesonide-formoterol 160-4.5 MCG/ACT inhaler Commonly known as: SYMBICORT Inhale 2 puffs into the lungs 2 (two) times daily.   carvedilol 12.5 MG tablet Commonly known as: COREG TAKE 1 TABLET BY MOUTH 2 TIMES DAILY   cefdinir 300 MG capsule Commonly known as: OMNICEF Take 1 capsule (300  mg total) by mouth 2 (two) times daily for 2 days.   cetirizine 10 MG tablet Commonly known as: ZYRTEC Take 10 mg by mouth daily as needed for allergies or rhinitis.   clopidogrel 75 MG tablet Commonly known as: PLAVIX Take 1 tablet (75 mg total) by mouth daily.   diclofenac Sodium 1 % Gel Commonly known as: Voltaren Apply 2 g topically 4 (four) times daily.   escitalopram 20 MG tablet Commonly known as: LEXAPRO Take 1 tablet (20 mg total) by mouth daily. What changed: Another medication with the same name was removed. Continue taking this medication, and follow the directions you see here.   febuxostat 40 MG tablet Commonly known as: ULORIC TAKE 1 TABLET BY MOUTH EVERY DAY   fluticasone 50 MCG/ACT nasal spray Commonly known as: FLONASE Place 1 spray into both nostrils in the morning and at bedtime.   folic acid 1 MG tablet Commonly known as: FOLVITE TAKE 1 TABLET BY MOUTH EVERY DAY   FreeStyle Libre 14 Day Sensor Misc PLACE 1 DEVICE ON THE SKIN AS DIRECTED EVERY 14 DAYS   guaifenesin 100 MG/5ML syrup Commonly known as: ROBITUSSIN Take 200 mg by mouth 3 (three) times daily  as needed for cough.   hydrOXYzine 25 MG tablet Commonly known as: ATARAX Take 1-2 tablets (25-50 mg total) by mouth every 8 (eight) hours as needed for anxiety or itching.   linaclotide 145 MCG Caps capsule Commonly known as: LINZESS Take 1 capsule (145 mcg total) by mouth daily before breakfast.   meclizine 25 MG tablet Commonly known as: ANTIVERT TAKE 1 TABLET BY MOUTH THREE TIMES DAILY AS NEEDED FOR DIZZINESS   memantine 5 MG tablet Commonly known as: NAMENDA TAKE 2 TABLETS BY MOUTH EVERY DAY and TAKE 1 TABLET BY MOUTH EVERY EVENING   metroNIDAZOLE 500 MG tablet Commonly known as: Flagyl Take 1 tablet (500 mg total) by mouth 3 (three) times daily for 2 days.   nitroGLYCERIN 0.4 MG SL tablet Commonly known as: Nitrostat Take one tablet under the tongue every 5 minutes as needed for  chest pain   NovoLOG FlexPen 100 UNIT/ML FlexPen Generic drug: insulin aspart Inject 14 Units into the skin 3 (three) times daily before meals.   nystatin cream Commonly known as: MYCOSTATIN Apply 1 application topically 2 (two) times daily.   OVER THE COUNTER MEDICATION Take 1 capsule by mouth every other day. Over the counter stool softener - unknown type   oxyCODONE-acetaminophen 7.5-325 MG tablet Commonly known as: Percocet Take 1 tablet by mouth every 12 (twelve) hours as needed for moderate pain or severe pain. Must last 30 days.   pantoprazole 20 MG tablet Commonly known as: PROTONIX TAKE 1 TABLET BY MOUTH EVERY DAY   sodium polystyrene 15 GM/60ML suspension Commonly known as: SPS take 34ms by MOUTH ONCE WEEKLY. TO keep potassium down   torsemide 20 MG tablet Commonly known as: DEMADEX TAKE 1 TABLET BY MOUTH EVERY DAY. Take an extra dose for weight gain, 3lb in 1 day or 5lbs in 1 week What changed: See the new instructions.   Toujeo Max SoloStar 300 UNIT/ML Solostar Pen Generic drug: insulin glargine (2 Unit Dial) Inject 42 Units into the skin daily. Patient assistance program provides   Vitamin D (Ergocalciferol) 1.25 MG (50000 UNIT) Caps capsule Commonly known as: DRISDOL TAKE 1 CAPSULE BY MOUTH every 7 days        Follow-up Information     TBonnita Hollow MD Follow up in 2 week(s).   Specialty: Family Medicine Why: Hospital follow up Contact information: [address] [phone]        NDorothy Spark MD .   Specialty: Cardiology Contact information: [place] [phone]               Discharge Exam: FDanley DankerWeights   02/08/22 1550  Weight: (!) 140.2 kg   General exam: Awake, laying in bed, in nad Respiratory system: Normal respiratory effort, no wheezing Cardiovascular system: regular rate, s1, s2 Gastrointestinal system: Soft, nondistended,  positive BS Central nervous system: CN2-12 grossly intact, strength intact Extremities: Perfused, no clubbing Skin: Normal skin turgor, no notable skin lesions seen Psychiatry: Mood normal // no visual hallucinations   Condition at discharge: fair  The results of significant diagnostics from this hospitalization (including imaging, microbiology, ancillary and laboratory) are listed below for reference.   Imaging Studies: NM Pulmonary Perfusion  Result Date: 02/10/2022 CLINICAL DATA:  Chest pain.  Pulmonary embolism suspected. EXAM: NUCLEAR MEDICINE PERFUSION LUNG SCAN TECHNIQUE: Perfusion images were obtained in multiple projections after intravenous injection of radiopharmaceutical. Ventilation scans intentionally deferred if perfusion scan and  chest x-ray adequate for interpretation during COVID 19 epidemic. RADIOPHARMACEUTICALS:  4.4 mCi Tc-63mMAA IV COMPARISON:  None Available. FINDINGS: No segmental or lobar perfusion defect is identified. IMPRESSION: No evidence of pulmonary embolism. Electronically Signed   By: SFranki CabotM.D.   On: 02/10/2022 11:54   DG Shoulder Left  Result Date: 02/10/2022 CLINICAL DATA:  Chronic left shoulder pain without known injury. EXAM: LEFT SHOULDER - 2+ VIEW COMPARISON:  July 04, 2021. FINDINGS: There is no evidence of fracture or dislocation. Moderate degenerative changes seen involving the left glenohumeral joint. Soft tissues are unremarkable. IMPRESSION: Moderate degenerative joint disease of the left glenohumeral joint. No acute abnormality seen. Electronically Signed   By: JMarijo ConceptionM.D.   On: 02/10/2022 08:10   CT CHEST ABDOMEN PELVIS WO CONTRAST  Result Date: 02/08/2022 CLINICAL DATA:  Dyspnea, abdominal pain EXAM: CT CHEST, ABDOMEN AND PELVIS WITHOUT CONTRAST TECHNIQUE: Multidetector CT imaging of the chest, abdomen and pelvis was performed following the standard protocol without IV contrast. RADIATION DOSE REDUCTION: This exam was  performed according to the departmental dose-optimization program which includes automated exposure control, adjustment of the mA and/or kV according to patient size and/or use of iterative reconstruction technique. COMPARISON:  CT 07/04/2021, 04/21/2014 FINDINGS: CT CHEST FINDINGS Cardiovascular: Normal heart size. No pericardial effusion. Thoracic aorta is nonaneurysmal. Atherosclerotic calcifications of the aorta and coronary arteries. Central pulmonary vasculature within normal limits. Mediastinum/Nodes: No axillary or mediastinal lymphadenopathy. No evidence of hilar adenopathy on noncontrast imaging. Trachea and esophagus demonstrate no significant findings. Incidentally noted 2.5 cm right thyroid lobe nodule. In the setting of significant comorbidities or limited life expectancy, no follow-up recommended (ref: J Am Coll Radiol. 2015 Feb;12(2): 143-50). Lungs/Pleura: Elevation of the right hemidiaphragm. Lungs are clear. No focal consolidation, pleural effusion, or pneumothorax. Musculoskeletal: No chest wall mass or suspicious bone lesions identified. Multilevel thoracic spondylosis. Severe arthropathy of the left glenohumeral joint. CT ABDOMEN PELVIS FINDINGS Hepatobiliary: No focal liver abnormality identified on noncontrast imaging. Mild pneumobilia is noted centrally. There are multiple stones within the gallbladder. No gallbladder wall thickening or pericholecystic inflammatory changes. There is a small amount of gas within the lumen of the gallbladder. Pancreas: Unremarkable. No pancreatic ductal dilatation or surrounding inflammatory changes. Spleen: Normal in size without focal abnormality. Adrenals/Urinary Tract: Unremarkable adrenal glands. Stable size of a 2.0 cm slightly hyperdense left renal cyst, likely hemorrhagic or proteinaceous in content. Simple right renal cysts. No follow-up imaging of the renal cysts is recommended. No renal stone or hydronephrosis. Urinary bladder within normal limits  for the degree of distension. Stomach/Bowel: Postsurgical changes at the GE junction. No dilated loops of bowel. No focal bowel wall thickening or inflammatory changes. Vascular/Lymphatic: Aortic atherosclerosis. No enlarged abdominal or pelvic lymph nodes. Reproductive: Uterus and bilateral adnexa are unremarkable. Other: No free fluid. No abdominopelvic fluid collection. No pneumoperitoneum. Postsurgical changes to the anterior abdominal wall. No abdominal wall hernia. Musculoskeletal: No acute or significant osseous findings. Degenerative lumbar spondylosis. Mild-moderate degenerative changes of the bilateral hips. IMPRESSION: 1. No acute abnormality within the chest, abdomen, or pelvis. 2. Cholelithiasis. 3. There is mild pneumobilia as well as a small amount of gas within the lumen of the gallbladder. Findings are nonspecific and could be related to prior sphincterotomy, sphincter of Oddi incompetence, or recently passed biliary stone. Correlate with laboratory values to exclude the possibility of a recent cholangitis. No inflammatory stranding is seen to suggest active infection/inflammation. Aortic Atherosclerosis (ICD10-I70.0). Electronically Signed   By: NHart Carwin  Plundo D.O.   On: 02/08/2022 18:54   DG Chest 2 View  Result Date: 02/08/2022 CLINICAL DATA:  Shortness of breath. EXAM: CHEST - 2 VIEW COMPARISON:  November 19, 2021 FINDINGS: EKG leads project over the chest. Trachea midline. Cardiomediastinal contours and hilar structures are stable with mild cardiomegaly accentuated by AP projection and portable technique. No lobar consolidation. No pneumothorax or pleural effusion. On limited assessment no acute skeletal findings. IMPRESSION: Mild cardiomegaly without acute cardiopulmonary disease. Electronically Signed   By: Zetta Bills M.D.   On: 02/08/2022 17:35    Microbiology: Results for orders placed or performed during the hospital encounter of 02/08/22  C Difficile Quick Screen w PCR reflex      Status: None   Collection Time: 02/10/22 12:57 PM   Specimen: STOOL  Result Value Ref Range Status   C Diff antigen NEGATIVE NEGATIVE Final   C Diff toxin NEGATIVE NEGATIVE Final   C Diff interpretation No C. difficile detected.  Final    Comment: Performed at [facility], [place] [address] [place], [place] [address]   *Note: Due to a large number of results and/or encounters for the requested time period, some results have not been displayed. A complete set of results can be found in Results Review.    Labs: CBC: Recent Labs  Lab 02/08/22 1557 02/09/22 1050 02/10/22 0707 02/11/22 0606 02/12/22 0722  WBC 8.9 10.3 10.5 11.3* 10.9*  NEUTROABS  --  6.3  --   --   --   HGB 9.3* 9.0* 9.2* 8.7* 8.4*  HCT 29.7* 28.5* 30.2* 29.3* 27.6*  MCV 92.8 91.3 96.5 97.3 95.8  PLT 256 243 231 224 676   Basic Metabolic Panel: Recent Labs  Lab 02/08/22 1557 02/09/22 1050 02/10/22 0707 02/11/22 0606 02/12/22 0722  NA 137 139 138 138 140  K 5.4* 5.1 4.9 4.6 4.7  CL 102 101 104 105 108  CO2 '26 26 26 25 24  '$ GLUCOSE 169* 129* 127* 149* 191*  BUN 67* 67* 68* 69* 66*  CREATININE 2.92* 2.78* 2.93* 2.59* 2.11*  CALCIUM 9.2 9.1 8.7* 8.4* 8.7*   Liver Function Tests: Recent Labs  Lab 02/08/22 1557 02/09/22 1050 02/10/22 0707 02/11/22 0606 02/12/22 0722  AST 11* 10* 13* 13* 13*  ALT '7 6 10 10 10  '$ ALKPHOS 74 75 70 67 65  BILITOT 0.4 0.5 0.4 0.4 0.3  PROT 7.7 7.6 7.1 7.0 7.0  ALBUMIN 3.6 3.5 3.1* 3.1* 2.9*   CBG: Recent Labs  Lab 02/11/22 1716 02/12/22 0026 02/12/22 0606 02/12/22 0801 02/12/22 1253  GLUCAP 294* 248* 191* 159* 229*    Discharge time spent: less than 30 minutes.  Signed: Marylu Lund, MD [facility] 02/12/2022

## 2022-02-12 NOTE — TOC Transition Note (Signed)
Transition of Care [facility]) - CM/SW Discharge Note   Patient Details  Name: [name] MRN: [id number] Date of Birth: 11/04/1934  [facility]) CM/SW Contact:  Ross Ludwig, LCSW Phone Number: 02/12/2022, 1:41 PM   Clinical Narrative:     Patient was seen by PT and OT, they recommended no follow up.  CSW signing off, patient plans to return back home with her family.   Final next level of care: Home/Self Care Barriers to Discharge: Barriers Resolved   Patient Goals and CMS Choice Patient states their goals for this hospitalization and ongoing recovery are:: To return back home. CMS Medicare.gov Compare Post Acute Care list provided to:: Patient Choice offered to / list presented to : Patient  Discharge Placement                       Discharge Plan and Services   Discharge Planning Services: CM Consult                                 Social Determinants of Health (SDOH) Interventions     Readmission Risk Interventions    11/24/2021   10:03 AM 10/20/2021    3:50 PM 02/18/2020   11:52 AM  Readmission Risk Prevention Plan  Transportation Screening Complete Complete Complete  Medication Review [employer] photographer) Complete Complete Complete  PCP or Specialist appointment within 3-5 days of discharge Complete Complete Complete  HRI or Home Care Consult Complete Complete Complete  SW Recovery Care/Counseling Consult Complete Complete Complete  Palliative Care Screening Not Applicable Not Applicable Not [place] Not Applicable Not Applicable Not Applicable

## 2022-02-12 NOTE — Plan of Care (Signed)

## 2022-02-12 NOTE — TOC CM/SW Note (Deleted)
  Transition of Care [facility]) Screening Note   Patient Details  Name: [name] Date of Birth: 24-May-1934   Transition of Care [facility]) CM/SW Contact:    Ross Ludwig, LCSW Phone Number: 02/12/2022, 1:39 PM    Transition of Care Department [facility]) has reviewed patient and no TOC needs have been identified at this time. We will continue to monitor patient advancement through interdisciplinary progression rounds. If new patient transition needs arise, please place a TOC consult.

## 2022-02-12 NOTE — Progress Notes (Signed)
OT Cancellation Note  Patient Details Name: [name] MRN: [id number] DOB: Feb 04, 1935   Cancelled Treatment:    Reason Eval/Treat Not Completed: Fatigue/lethargy limiting ability to participate s/p PT evaluation. Patient also eating lunch at time of this writers arrival. OT to check back as time allows.   Gloris Manchester OTR/L Supplemental OT, Department of rehab services [phone]  Dominyck Reser R H. 02/12/2022, 12:35 PM

## 2022-02-12 NOTE — Evaluation (Signed)
Physical Therapy Evaluation Patient Details Name: [name] MRN: [id number] DOB: 14-Feb-1935 Today's Date: 02/12/2022  History of Present Illness  Pt is an 87 y.o. femlae who presented to the ED with abdominal pain for the past month admitted for Pneumobilia. PMH significnat for acute kidney injury,  anemia, anxiety, osteoarthritis, breast cancer, breast mass, chronic pain syndrome, stage III CKD, coronary artery disease, NSTEMI, dementia, type 2 diabetes, dysphagia,depression, gout,history of DVT, history of PE, hyperkalemia, hyperlipidemia, hypertension, obesity, osteoarthritis, polymyalgia rheumatica, vasovagal syncope, and history of vertigo .   Clinical Impression  Pt is an 87 y.o. female with above HPI resulting in the deficits listed below (see PT Problem List). Pt lives with her daughter and receives assist with transfers to BSC/wheelchair and has aide 2x/week who assists with bathing/dressing and some home management tasks such as making her bed. Pt performed sit to stand transfers with MOD A +2 for initiation of power up and step pivot to recliner chair with MIN A +1. Pt reports her daughter is there to assist at all times when needed for mobility. Pt will benefit from continued skilled PT to maximize functional mobility in order to increase independence and decrease caregiver burden.         Recommendations for follow up therapy are one component of a multi-disciplinary discharge planning process, led by the attending physician.  Recommendations may be updated based on patient status, additional functional criteria and insurance authorization.  Follow Up Recommendations No PT follow up      Assistance Recommended at Discharge Frequent or constant Supervision/Assistance  Patient can return home with the following  A little help with walking and/or transfers;Help with stairs or ramp for entrance;Assist for transportation;Assistance with cooking/housework;A lot of help with  bathing/dressing/bathroom    Equipment Recommendations None recommended by PT  Recommendations for Other Services       Functional Status Assessment Patient has had a recent decline in their functional status and demonstrates the ability to make significant improvements in function in a reasonable and predictable amount of time.     Precautions / Restrictions Precautions Precautions: Fall;Other (comment) (skin integrity) Restrictions Weight Bearing Restrictions: No      Mobility  Bed Mobility Overal bed mobility: Needs Assistance Bed Mobility: Supine to Sit     Supine to sit: Min assist, HOB elevated     General bed mobility comments: MIn A to bring trunk to upright    Transfers Overall transfer level: Needs assistance Equipment used: 1 person hand held assist Transfers: Sit to/from Stand, Bed to chair/wheelchair/BSC Sit to Stand: Mod assist   Step pivot transfers: Min assist       General transfer comment: MOD A from power up with 1 person on side and 1 person behind- pt reports daughter "pushes" her from behind for boost to stand while pt uses armrest for stability to pivot hips to BSC/ or wheelchair, sometimes she uses walker to perform transfers. 1 UE support on recliner armrest and HHA from therapist on opposite side and pt able to take x3 lateral steps and turn hips to chair with increased time.    Ambulation/Gait                  Stairs            Wheelchair Mobility    Modified Rankin (Stroke Patients Only)       Balance Overall balance assessment: Needs assistance Sitting-balance support: Feet supported Sitting balance-Leahy Scale: Good  Standing balance support: Bilateral upper extremity supported, During functional activity, Reliant on assistive device for balance Standing balance-Leahy Scale: Poor                               Pertinent Vitals/Pain Pain Assessment Pain Assessment: Faces Faces Pain Scale: Hurts  little more Pain Location: R side Pain Descriptors / Indicators: Tender Pain Intervention(s): Monitored during session, Other (comment) (RN notified)    Home Living Family/patient expects to be discharged to:: Private residence Living Arrangements: Children (daughter) Available Help at Discharge: Family;Available 24 hours/day Type of Home: House Home Access: Level entry       Home Layout: One level Home Equipment: Wheelchair - manual;BSC/3in1;Shower seat;Grab bars - tub/shower;Hand held [employer] (4 wheels)      Prior Function Prior Level of Function : Needs assist       Physical Assist : Mobility (physical) Mobility (physical): Bed mobility;Transfers   Mobility Comments: Uses WC mainly, assist for all transfers to and from chair/recliner ADLs Comments: aide for bathing and dressing, making bed a few hours 2x/wk     Hand Dominance   Dominant Hand: Right    Extremity/Trunk Assessment   Upper Extremity Assessment Upper Extremity Assessment: Defer to OT evaluation    Lower Extremity Assessment Lower Extremity Assessment: Generalized weakness    Cervical / Trunk Assessment Cervical / Trunk Assessment: Normal  Communication   Communication: No difficulties  Cognition Arousal/Alertness: Awake/alert Behavior During Therapy: WFL for tasks assessed/performed Overall Cognitive Status: Within Functional Limits for tasks assessed                                          General Comments      Exercises     Assessment/Plan    PT Assessment Patient needs continued PT services  PT Problem List Decreased strength;Decreased range of motion;Decreased activity tolerance;Decreased balance;Decreased mobility;Decreased safety awareness       PT Treatment Interventions DME instruction;Gait training;Functional mobility training;Therapeutic activities;Therapeutic exercise;Balance training;Patient/family education;Wheelchair mobility training    PT  Goals (Current goals can be found in the Care Plan section)  Acute Rehab PT Goals Patient Stated Goal: Keep moving and able to perform transfers PT Goal Formulation: With patient Time For Goal Achievement: 02/26/22 Potential to Achieve Goals: Good    Frequency Min 3X/week     Co-evaluation               AM-PAC PT "6 Clicks" Mobility  Outcome Measure Help needed turning from your back to your side while in a flat bed without using bedrails?: A Little Help needed moving from lying on your back to sitting on the side of a flat bed without using bedrails?: A Lot Help needed moving to and from a bed to a chair (including a wheelchair)?: A Little Help needed standing up from a chair using your arms (e.g., wheelchair or bedside chair)?: A Lot Help needed to walk in hospital room?: A Lot Help needed climbing 3-5 steps with a railing? : Total 6 Click Score: 13    End of Session Equipment Utilized During Treatment: Other (comment) (pt refused use of gait belt during mobility) Activity Tolerance: Patient tolerated treatment well Patient left: in chair;with call bell/phone within reach;with chair alarm set Nurse Communication: Mobility status PT Visit Diagnosis: Muscle weakness (generalized) (M62.81);Unsteadiness on feet (R26.81)  Time: 6270-3500 PT Time Calculation (min) (ACUTE ONLY): 19 min   Charges:   PT Evaluation $PT Eval Low Complexity: 1 Low         Festus Barren PT, DPT  Acute Rehabilitation Services  Office [phone]   02/12/2022, 12:11 PM

## 2022-02-13 ENCOUNTER — Ambulatory Visit (HOSPITAL_COMMUNITY)
Admission: RE | Admit: 2022-02-13 | Discharge: 2022-02-13 | Disposition: A | Discharge status: Home / Self Care | Payer: HMO | Source: Ambulatory Visit | Attending: Nephrology | Admitting: Nephrology

## 2022-02-13 ENCOUNTER — Encounter (HOSPITAL_COMMUNITY): Payer: Self-pay

## 2022-02-13 ENCOUNTER — Encounter: Payer: Self-pay | Admitting: Hematology

## 2022-02-13 ENCOUNTER — Telehealth: Payer: Self-pay | Admitting: Family Medicine

## 2022-02-13 ENCOUNTER — Telehealth: Payer: Self-pay

## 2022-02-13 DIAGNOSIS — Z539 Procedure and treatment not carried out, unspecified reason: Secondary | ICD-10-CM | POA: Insufficient documentation

## 2022-02-13 MED ORDER — SODIUM CHLORIDE 0.9 % IV SOLN
300.0000 mg | Freq: Once | INTRAVENOUS | Status: DC
  Filled 2022-02-13: qty 15

## 2022-02-13 NOTE — Progress Notes (Signed)
At bedside for PIV access. Pt extremely intolerant of insertion. Unable to remain still for procedure. States, " I am done. I won't be stuck anymore." Primary RN aware and to alert MD.

## 2022-02-13 NOTE — Telephone Encounter (Signed)
Transition Care Management Follow-up Telephone Call Date of discharge and from where: 02/12/22 [facility] Inpatient How have you been since you were released from the hospital? Daughter/POA states Ms. Blando is doing better. Any questions or concerns? No  Items Reviewed: Did the pt receive and understand the discharge instructions provided? Yes  Medications obtained and verified? Yes  Other? No  Any new allergies since your discharge? No  Dietary orders reviewed? No Do you have support at home? Yes   Home Care and Equipment/Supplies: Were home health services ordered? not applicable If so, what is the name of the agency? N/a  Has the agency set up a time to come to the patient's home? not applicable Were any new equipment or medical supplies ordered?  No What is the name of the medical supply agency? N/a Were you able to get the supplies/equipment? not applicable Do you have any questions related to the use of the equipment or supplies? No  Functional Questionnaire: (I = Independent and D = Dependent) ADLs: D  Bathing/Dressing- D  Meal Prep- D  Eating- D  Maintaining continence- D  Transferring/Ambulation- D  Managing Meds- D  Follow up appointments reviewed:  PCP Hospital f/u appt confirmed? Yes  Scheduled to see Dr. Grandville Silos on 02/27/22 @ 10:40am. [facility] f/u appt confirmed?  N/a  Scheduled to see n/a on n/a @ n/a. Are transportation arrangements needed? No  If their condition worsens, is the pt aware to call PCP or go to the Emergency Dept.? Yes Was the patient provided with contact information for the PCP's office or ED? Yes Was to pt encouraged to call back with questions or concerns? Yes  Angeline Slim, RN, BSN

## 2022-02-13 NOTE — Progress Notes (Signed)
Patient very intolerant for PIV access. Unable to remain still- refused IV team and asked she not be stuck again. Spoke with [employer] (Cherise) about situation and that patient was not able to get venofer and refused IV.

## 2022-02-13 NOTE — Telephone Encounter (Signed)
Caller Name: Nastashia Gallo Call back phone #: [phone]  Reason for Call: Dropped off form to be filled out by Dr. Grandville Silos, in his folder up front

## 2022-02-14 ENCOUNTER — Telehealth: Payer: Self-pay

## 2022-02-14 NOTE — Telephone Encounter (Signed)
Spoke with [name] with [employer] in regards to an attempt to obtain custodial services for [name]. Determined that Ms. [name] needed to be seen in the office sooner than her previously scheduled appt.   Called daugher/POA, [name], to reschedule [name]'s HOSP F/U appt.

## 2022-02-14 NOTE — Telephone Encounter (Signed)
Placed in provider's office for completion

## 2022-02-14 NOTE — Telephone Encounter (Signed)
Advised [name] that form is completed and placed upfront for pick up. She stated she would come to office for form

## 2022-02-16 ENCOUNTER — Other Ambulatory Visit: Payer: PPO | Admitting: Internal Medicine

## 2022-02-16 ENCOUNTER — Ambulatory Visit (INDEPENDENT_AMBULATORY_CARE_PROVIDER_SITE_OTHER): Payer: HMO | Admitting: Internal Medicine

## 2022-02-16 ENCOUNTER — Encounter: Payer: Self-pay | Admitting: Internal Medicine

## 2022-02-16 VITALS — BP 120/72 | HR 72 | Ht 67.0 in | Wt 295.0 lb

## 2022-02-16 DIAGNOSIS — E1122 Type 2 diabetes mellitus with diabetic chronic kidney disease: Secondary | ICD-10-CM | POA: Insufficient documentation

## 2022-02-16 DIAGNOSIS — Z515 Encounter for palliative care: Secondary | ICD-10-CM

## 2022-02-16 DIAGNOSIS — E1169 Type 2 diabetes mellitus with other specified complication: Secondary | ICD-10-CM

## 2022-02-16 DIAGNOSIS — E119 Type 2 diabetes mellitus without complications: Secondary | ICD-10-CM | POA: Insufficient documentation

## 2022-02-16 DIAGNOSIS — N184 Chronic kidney disease, stage 4 (severe): Secondary | ICD-10-CM

## 2022-02-16 DIAGNOSIS — E1140 Type 2 diabetes mellitus with diabetic neuropathy, unspecified: Secondary | ICD-10-CM

## 2022-02-16 DIAGNOSIS — R630 Anorexia: Secondary | ICD-10-CM | POA: Diagnosis not present

## 2022-02-16 DIAGNOSIS — B372 Candidiasis of skin and nail: Secondary | ICD-10-CM

## 2022-02-16 DIAGNOSIS — E1165 Type 2 diabetes mellitus with hyperglycemia: Secondary | ICD-10-CM

## 2022-02-16 DIAGNOSIS — F5101 Primary insomnia: Secondary | ICD-10-CM | POA: Diagnosis not present

## 2022-02-16 DIAGNOSIS — Z794 Long term (current) use of insulin: Secondary | ICD-10-CM

## 2022-02-16 DIAGNOSIS — F332 Major depressive disorder, recurrent severe without psychotic features: Secondary | ICD-10-CM | POA: Diagnosis not present

## 2022-02-16 DIAGNOSIS — F419 Anxiety disorder, unspecified: Secondary | ICD-10-CM

## 2022-02-16 DIAGNOSIS — C50919 Malignant neoplasm of unspecified site of unspecified female breast: Secondary | ICD-10-CM | POA: Diagnosis not present

## 2022-02-16 HISTORY — DX: Type 2 diabetes mellitus with other specified complication: E11.69

## 2022-02-16 MED ORDER — NYSTATIN 100000 UNIT/GM EX CREA: 1.0000 | TOPICAL_CREAM | Freq: Two times a day (BID) | CUTANEOUS | 3 refills | Status: DC

## 2022-02-16 MED ORDER — TRAZODONE HCL 50 MG PO TABS: 25.0000 mg | ORAL_TABLET | Freq: Every day | ORAL | 3 refills | Status: DC

## 2022-02-16 MED ORDER — ESCITALOPRAM OXALATE 20 MG PO TABS: 10.0000 mg | ORAL_TABLET | Freq: Every day | ORAL | 0 refills | Status: DC

## 2022-02-16 NOTE — Progress Notes (Signed)
[facility] Visit Telephone: [phone]  Fax: [phone]   Date of encounter: 02/16/22 1:05 PM PATIENT NAME: [name] [address] [place] [address]   [phone] (home)  DOB: July 25, 1934 MRN: [id number] PRIMARY CARE PROVIDER:    Bonnita Hollow, MD,  [place] [address] [phone]  REFERRING PROVIDER:   Bonnita Hollow, MD [place],  [place] [address] [phone]  RESPONSIBLE PARTY:    Contact Information     Name Relation Home Work [place] [phone] Daughter [phone]  [phone]   Milynn, Quirion [phone]  [phone]        I met face to face with patient and family in her home along with daughters [name] and [name]. Palliative Care was asked to follow this patient by consultation request of  Bonnita Hollow, MD to address advance care planning and complex medical decision making. This is follow-up visit.                                     ASSESSMENT AND PLAN / RECOMMENDATIONS:   Advance Care Planning/Goals of Care: Goals include to maximize quality of life and symptom management. Patient/health care surrogate gave his/her permission to discuss.Our advance care planning conversation included a discussion about:    The value and importance of advance care planning  Experiences with loved ones who have been seriously ill or have died  Exploration of personal, cultural or spiritual beliefs that might influence medical decisions  Exploration of goals of care in the event of a sudden injury or illness  Identification  of a healthcare agent--she is clear she wants [name] to make decisions for her.  Review and updating or creation of an  advance directive document . Decision not to resuscitate or to de-escalate disease focused treatments due to poor prognosis. CODE STATUS:  DNR, MOST on file already Reviewed pt's  goals--she would like to avoid returning to the hospital.  She is interested in hospice care, but her daughter, [name], and grandson got upset when she said this and discouraged it.  After they left, pt explained that she plans to elect [name] to make her decisions and completed the initial parts of her living will and HCPOA document pending signatures and notarization.  Symptom Management/Plan: 1. Primary insomnia -start trazodone 66m at hs and wean lexapro  2. Anxiety - escitalopram (LEXAPRO) 20 MG tablet; Take 0.5 tablets (10 mg total) by mouth daily for 7 days. Then stop  Dispense: 3.5 tablet; Refill: 0  3. Candidiasis of skin -needed renewal of cream - nystatin cream (MYCOSTATIN); Apply 1 Application topically 2 (two) times daily.  Dispense: 30 g; Refill: 3  4. Severe episode of recurrent major depressive disorder, without psychotic features ([facility] -will try her on trazodone so she can also rest  5. Malignant neoplasm of female breast, unspecified estrogen receptor status, unspecified laterality, unspecified site of breast ([facility] -on hormonal treatment and JMechele Claudeand grandson did not want her to stop that for hospice  6. Loss of appetite -eating bites a 1-2 times a day since her hospitalization -has lost 14 lbs over 8 days  -encouraged frequent small snacks in view of diabetes which is under reasonable control now, but to avoid lows  7. Palliative care by specialist -offered hospice in view of her personal agreement with stopping treatments and focusing  on comfort at home, avoiding hospitalizations, but some family not agreeable to this -educated about importance of getting HCPOA and living will signed and notarized to help prevent any interference with her already established wishes -pt's daughter also needs in home care for her mom--personal care assistant ideally for two chunks of time during day to get ready in am and to get ready for bed in evening--will ask social worker to connect  with her   Follow up Palliative Care Visit: Palliative care will continue to follow for complex medical decision making, advance care planning, and clarification of goals. Return prn.   This visit was coded based on medical decision making (MDM). 25 mins spent on acp  PPS: 40%  HOSPICE ELIGIBILITY/DIAGNOSIS: TBD  Chief Complaint: Follow-up palliative visit  HISTORY OF PRESENT ILLNESS:  [name] is a 87 y.o. year old female  with morbid obesity, htn, hyperlipidemia, DMII with neuropathy, chronic back pain, chronic diastolic chf, osa, COPD with chronic bronchitis, CKDIV, OA, constipation and breast cancer on hormone therapy seen for palliative f/u at home.   Can't sleep at night.  Had to sit up on the side of the bed.  Once she lays down, she can't fall asleep, then drowsy all day.  She had her endocrine appt this am which wore her out, but her hba1c is down.  She weighs 295 lbs--lost 14 lbs in 8 days at hospital.  Doesn't like the healthy heart diet there.  Hba1c 7.1    She wants something for pain.  She says she does not want surgery.  Says she's lived her years and part of mine.  She's tired.  She wants to be able to sleep.  Her skin is tender.  She says she's too old, can't do anything for herself, can't even wipe her own behind.  Her daughter needs help to help her.  She doesn't want to go to a nursing home.  She doesn't want to stay wet.    He has to sleep on her right side all of the time.  Her arm hurts her to push herself up on her left side.  She has a sore on her side.  She's tearful about all of this.   It's been that way over a year.  Uses rollator walker, but is pushed in it.  [name] helps her get into the bed and turn over, helps with her hygiene.  When she takes a breath, it gets tight and heavy across her right chest.  Has to use inhaler 2x getting clothes on.    Bowels improved from what they were at the hospital.  She c/o her pannus having pain in it on the left side.     Antibiotics gave her diarrhea.  Had purewick.    Wants caregiver couple hours in am and couple hours in pm.    Rarely takes percocet--just once this week so far.    History obtained from review of EMR, discussion with primary team, and interview with family, facility staff/caregiver and/or Ms. Nichter.   I reviewed available labs, medications, imaging, studies and related documents from the EMR.  Records reviewed and summarized above.   ROS Review of Systems  Constitutional:  Positive for activity change, appetite change, fatigue and unexpected weight change. Negative for chills.  HENT:  Negative for trouble swallowing.   Eyes:  Negative for visual disturbance.  Respiratory:  Positive for shortness of breath. Negative for chest tightness.   Cardiovascular:  Negative for leg swelling.  Gastrointestinal:  Positive for constipation.  Genitourinary:  Negative for dysuria.  Musculoskeletal:  Positive for arthralgias and back pain.  Neurological:  Positive for weakness. Negative for dizziness.  Psychiatric/Behavioral:  Positive for confusion, dysphoric mood and sleep disturbance. The patient is nervous/anxious.     Physical Exam: There were no vitals filed for this visit. There is no height or weight on file to calculate BMI. Wt Readings from Last 500 Encounters:  02/16/22 295 lb (133.8 kg)  02/08/22 (!) 309 lb (140.2 kg)  02/08/22 (!) 309 lb (140.2 kg)  01/17/22 (!) 309 lb (140.2 kg)  01/16/22 (!) 309 lb (140.2 kg)  12/30/21 (!) 309 lb 9.6 oz (140.4 kg)  12/27/21 (!) 304 lb (137.9 kg)  11/26/21 (!) 304 lb 14.3 oz (138.3 kg)  10/27/21 (!) 309 lb (140.2 kg)  10/25/21 (!) 310 lb (140.6 kg)  10/22/21 (!) 315 lb 14.7 oz (143.3 kg)  10/11/21 (!) 342 lb (155.1 kg)  08/24/21 (!) 342 lb 9.6 oz (155.4 kg)  08/23/21 (!) 309 lb (140.2 kg)  07/19/21 (!) 333 lb (151 kg)  07/08/21 (!) 311 lb 1.1 oz (141.1 kg)  05/18/21 (!) 313 lb (142 kg)  04/27/21 (!) 316 lb (143.3 kg)  03/09/21 (!) 316  lb 8 oz (143.6 kg)  01/26/21 (!) 319 lb (144.7 kg)  12/21/20 (!) 331 lb (150.1 kg)  12/17/20 (!) 331 lb 12.8 oz (150.5 kg)  10/12/20 (!) 332 lb (150.6 kg)  09/29/20 (!) 332 lb (150.6 kg)  09/07/20 (!) 330 lb 6.4 oz (149.9 kg)  07/20/20 (!) 338 lb (153.3 kg)  06/03/20 (!) 338 lb (153.3 kg)  04/12/20 (!) 338 lb (153.3 kg)  03/18/20 (!) 338 lb (153.3 kg)  03/03/20 (!) 338 lb (153.3 kg)  03/02/20 (!) 338 lb (153.3 kg)  02/23/20 (!) 338 lb 1.6 oz (153.4 kg)  02/23/20 (!) 338 lb (153.3 kg)  02/14/20 (!) 353 lb (160.1 kg)  12/23/19 (!) 329 lb (149.2 kg)  09/12/19 (!) 320 lb (145.2 kg)  07/29/19 (!) 323 lb (146.5 kg)  07/24/19 (!) 322 lb (146.1 kg)  06/03/19 (!) 325 lb (147.4 kg)  05/26/19 (!) 312 lb (141.5 kg)  03/31/19 300 lb (136.1 kg)  03/24/19 (!) 330 lb (149.7 kg)  02/12/19 (!) 323 lb 3.2 oz (146.6 kg)  01/30/19 (!) 325 lb 9.9 oz (147.7 kg)  12/24/18 (!) 325 lb (147.4 kg)  12/20/18 (!) 323 lb (146.5 kg)  12/18/18 (!) 329 lb 12.8 oz (149.6 kg)  12/18/18 (!) 329 lb 12.8 oz (149.6 kg)  10/23/18 (!) 325 lb (147.4 kg)  07/17/18 (!) 323 lb (146.5 kg)  07/03/18 (!) 323 lb (146.5 kg)  06/03/18 (!) 324 lb (147 kg)  05/28/18 (!) 325 lb 1.6 oz (147.5 kg)  04/12/18 (!) 323 lb (146.5 kg)  03/11/18 (!) 321 lb (145.6 kg)  12/11/17 (!) 327 lb (148.3 kg)  11/28/17 (!) 325 lb (147.4 kg)  11/27/17 (!) 325 lb 12.8 oz (147.8 kg)  11/21/17 300 lb (136.1 kg)  11/05/17 (!) 323 lb (146.5 kg)  10/30/17 (!) 327 lb 12.8 oz (148.7 kg)  10/24/17 (!) 333 lb 12.8 oz (151.4 kg)  10/23/17 (!) 332 lb 11.2 oz (150.9 kg)  10/10/17 (!) 341 lb 14.9 oz (155.1 kg)  09/07/17 (!) 341 lb (154.7 kg)  08/27/17 (!) 350 lb (158.8 kg)  08/02/17 (!) 350 lb (158.8 kg)  07/26/17 (!) 350 lb 1.5 oz (158.8 kg)  06/08/17 (!) 345 lb (156.5 kg)  05/21/17 (!) 336  lb (152.4 kg)  04/23/17 (!) 340 lb (154.2 kg)  04/02/17 (!) 347 lb (157.4 kg)  04/02/17 (!) 347 lb (157.4 kg)  03/21/17 (!) 341 lb 9.6 oz (154.9 kg)  02/27/17  (!) 343 lb (155.6 kg)  02/26/17 (!) 343 lb (155.6 kg)  01/29/17 (!) 344 lb 3.2 oz (156.1 kg)  01/01/17 (!) 341 lb (154.7 kg)  12/12/16 (!) 341 lb (154.7 kg)  12/06/16 (!) 341 lb (154.7 kg)  11/15/16 (!) 346 lb 12.8 oz (157.3 kg)  11/06/16 (!) 340 lb 3.2 oz (154.3 kg)  10/26/16 (!) 333 lb (151 kg)  10/13/16 (!) 333 lb (151 kg)  10/09/16 (!) 333 lb (151 kg)  09/11/16 (!) 337 lb (152.9 kg)  08/14/16 (!) 341 lb (154.7 kg)  08/02/16 (!) 338 lb (153.3 kg)  07/27/16 (!) 338 lb 6.4 oz (153.5 kg)  07/23/16 (!) 341 lb (154.7 kg)  07/03/16 (!) 344 lb 6.4 oz (156.2 kg)  06/23/16 (!) 339 lb 9.6 oz (154 kg)  05/22/16 (!) 339 lb 6.4 oz (154 kg)  04/24/16 (!) 344 lb (156 kg)  03/27/16 (!) 342 lb (155.1 kg)  03/20/16 (!) 341 lb (154.7 kg)  02/21/16 (!) 349 lb (158.3 kg)  02/02/16 (!) 343 lb (155.6 kg)  01/24/16 (!) 343 lb (155.6 kg)  12/13/15 (!) 341 lb 12.8 oz (155 kg)  12/08/15 (!) 344 lb (156 kg)  12/06/15 (!) 347 lb (157.4 kg)  11/01/15 (!) 343 lb 9.6 oz (155.9 kg)  10/11/15 (!) 340 lb (154.2 kg)  09/24/15 (!) 344 lb (156 kg)  09/06/15 (!) 344 lb 6.4 oz (156.2 kg)  08/09/15 (!) 343 lb (155.6 kg)  08/04/15 (!) 341 lb (154.7 kg)  07/30/15 (!) 341 lb 4.8 oz (154.8 kg)  06/02/15 (!) 344 lb 3.2 oz (156.1 kg)  04/26/15 (!) 351 lb 6.4 oz (159.4 kg)  04/21/15 (!) 348 lb (157.9 kg)  03/29/15 (!) 350 lb 9.6 oz (159 kg)  02/22/15 (!) 352 lb 9.6 oz (159.9 kg)  02/05/15 (!) 355 lb 12.8 oz (161.4 kg)  01/20/15 (!) 355 lb 14.4 oz (161.4 kg)  01/18/15 (!) 347 lb 9.6 oz (157.7 kg)  12/16/14 (!) 360 lb 6.4 oz (163.5 kg)  12/14/14 (!) 359 lb 12.8 oz (163.2 kg)  11/19/14 (!) 367 lb (166.5 kg)  11/18/14 (!) 368 lb (166.9 kg)  11/05/14 (!) 366 lb (166 kg)  10/29/14 (!) 366 lb (166 kg)  10/27/14 (!) 366 lb (166 kg)  10/07/14 (!) 370 lb (167.8 kg)  09/26/14 (!) 358 lb 12.8 oz (162.8 kg)  09/23/14 (!) 366 lb (166 kg)  09/17/14 (!) 366 lb 4 oz (166.1 kg)  09/07/14 (!) 370 lb (167.8 kg)  09/04/14 (!)  370 lb 3.2 oz (167.9 kg)  08/12/14 (!) 359 lb (162.8 kg)  07/08/14 (!) 368 lb (166.9 kg)  06/25/14 (!) 371 lb (168.3 kg)  06/08/14 (!) 362 lb (164.2 kg)  06/04/14 (!) 361 lb (163.7 kg)  05/25/14 (!) 361 lb (163.7 kg)  04/21/14 (!) 356 lb 7.7 oz (161.7 kg)  03/09/14 (!) 375 lb (170.1 kg)  02/18/14 (!) 375 lb (170.1 kg)  02/17/14 (!) 374 lb 3.2 oz (169.7 kg)  12/31/13 (!) 357 lb (161.9 kg)  12/09/13 (!) 357 lb 12.8 oz (162.3 kg)  11/05/13 (!) 343 lb (155.6 kg)  10/20/13 (!) 356 lb (161.5 kg)  10/14/13 (!) 357 lb (161.9 kg)  10/14/13 (!) 357 lb (161.9 kg)  09/23/13 (!) 350 lb (158.8 kg)  07/29/13 [name])  356 lb 3.2 oz (161.6 kg)  06/04/13 (!) 354 lb 9.6 oz (160.8 kg)  06/02/13 (!) 350 lb (158.8 kg)  05/12/13 (!) 353 lb (160.1 kg)  03/19/13 (!) 345 lb (156.5 kg)  02/24/13 (!) 347 lb 6.4 oz (157.6 kg)  02/06/13 (!) 345 lb (156.5 kg)  01/19/13 (!) 343 lb (155.6 kg)  01/14/13 (!) 336 lb (152.4 kg)  01/08/13 (!) 343 lb 12.8 oz (155.9 kg)  12/23/12 (!) 346 lb (156.9 kg)  09/17/12 (!) 329 lb 6.4 oz (149.4 kg)  08/01/12 (!) 333 lb (151 kg)  07/31/12 (!) 333 lb 12.8 oz (151.4 kg)  07/31/12 (!) 319 lb (144.7 kg)  05/15/12 (!) 319 lb (144.7 kg)  01/29/12 (!) 335 lb 6.4 oz (152.1 kg)  12/15/11 (!) 328 lb (148.8 kg)  12/13/10 (!) 367 lb (166.5 kg)  10/12/10 (!) 383 lb (173.7 kg)  09/15/10 (!) 358 lb 1.9 oz (162.4 kg)  08/01/10 (!) 385 lb (174.6 kg)   Physical Exam Constitutional:      Appearance: She is obese.  HENT:     Head: Normocephalic and atraumatic.  Cardiovascular:     Rate and Rhythm: Normal rate and regular rhythm.  Pulmonary:     Effort: Pulmonary effort is normal.     Breath sounds: Normal breath sounds.  Abdominal:     General: Bowel sounds are normal.     Comments: Tender in lower quadrants of abdomen  Musculoskeletal:        General: Normal range of motion.     Right lower leg: No edema.     Left lower leg: No edema.     Comments: Uses rollator but mostly sits on  it and others propel her except to stand and pivot with help  Neurological:     General: No focal deficit present.     Mental Status: She is alert and oriented to person, place, and time.     Comments: but some short term memory loss  Psychiatric:     Comments: Near tears at times, holds head in hands     CURRENT PROBLEM LIST:  Patient Active Problem List   Diagnosis Date Noted   Type 2 diabetes mellitus with stage 4 chronic kidney disease, with long-term current use of insulin ([place]) 02/16/2022   Type 2 diabetes mellitus with diabetic neuropathy, with long-term current use of insulin ([place]) 02/16/2022   Type 2 diabetes mellitus with hyperglycemia, with long-term current use of insulin ([place]) 02/16/2022   Dermatosis papulosa nigra 02/09/2022   Positive D dimer 02/09/2022   CKD (chronic kidney disease), stage IV ([place]) 02/09/2022   Pneumobilia 02/08/2022   Severe episode of recurrent major depressive disorder, without psychotic features ([place]) 12/05/2021   AKI (acute kidney injury) ([place]) 11/22/2021   Acute on chronic diastolic (congestive) heart failure ([place]) 11/19/2021   Anxiety 11/10/2021   Breast cancer ([place]) 10/22/2021   Right elbow pain 04/27/2021   Chronic pain of right hand 04/27/2021   Iron deficiency anemia 09/13/2020   Bilateral primary osteoarthritis of knee 04/12/2020   Chronic radicular lumbar pain 03/18/2020   Lumbar spondylosis 03/18/2020   Spinal stenosis, lumbar region, with neurogenic claudication 03/18/2020   Moderate nonproliferative diabetic retinopathy of both eyes without macular edema associated with type 2 diabetes mellitus ([place]) 08/27/2019   Type 2 diabetes mellitus with hyperlipidemia ([place]) 12/13/2017   GERD with esophagitis 11/15/2016   Dementia ([place]) 11/15/2016   Endometrial polyp 06/23/2016   Vitamin D deficiency 08/02/2015   Acute  kidney injury superimposed on chronic kidney disease ([place]) 12/18/2014   Gout 10/27/2014   CN (constipation) 05/10/2014    COPD with chronic bronchitis 04/21/2014   Estrogen deficiency 02/19/2014   Insomnia 09/17/2012   Long term current use of anticoagulant therapy 02/02/2012   Chronic diastolic congestive heart failure ([place]) 01/29/2012   Class 3 obesity ([place]) 02/15/2009   Microcytic anemia 02/15/2009   TENSION HEADACHE 01/07/2008   Hyperlipidemia 12/03/2006   Essential hypertension 12/03/2006    PAST MEDICAL HISTORY:  Active Ambulatory Problems    Diagnosis Date Noted   Hyperlipidemia 12/03/2006   Class 3 obesity ([place]) 02/15/2009   Microcytic anemia 02/15/2009   TENSION HEADACHE 01/07/2008   Essential hypertension 12/03/2006   Chronic diastolic congestive heart failure ([place]) 01/29/2012   Long term current use of anticoagulant therapy 02/02/2012   Insomnia 09/17/2012   Estrogen deficiency 02/19/2014   COPD with chronic bronchitis 04/21/2014   CN (constipation) 05/10/2014   Gout 10/27/2014   Acute kidney injury superimposed on chronic kidney disease ([place]) 12/18/2014   Vitamin D deficiency 08/02/2015   Endometrial polyp 06/23/2016   GERD with esophagitis 11/15/2016   Dementia ([place]) 11/15/2016   Type 2 diabetes mellitus with hyperlipidemia ([place]) 12/13/2017   Moderate nonproliferative diabetic retinopathy of both eyes without macular edema associated with type 2 diabetes mellitus ([place]) 08/27/2019   Chronic radicular lumbar pain 03/18/2020   Lumbar spondylosis 03/18/2020   Spinal stenosis, lumbar region, with neurogenic claudication 03/18/2020   Bilateral primary osteoarthritis of knee 04/12/2020   Iron deficiency anemia 09/13/2020   Right elbow pain 04/27/2021   Chronic pain of right hand 04/27/2021   Breast cancer ([place]) 10/22/2021   Anxiety 11/10/2021   Acute on chronic diastolic (congestive) heart failure ([place]) 11/19/2021   AKI (acute kidney injury) ([place]) 11/22/2021   Severe episode of recurrent major depressive disorder, without psychotic features ([place]) 12/05/2021   Pneumobilia 02/08/2022    Dermatosis papulosa nigra 02/09/2022   Positive D dimer 02/09/2022   CKD (chronic kidney disease), stage IV ([place]) 02/09/2022   Type 2 diabetes mellitus with stage 4 chronic kidney disease, with long-term current use of insulin ([place]) 02/16/2022   Type 2 diabetes mellitus with diabetic neuropathy, with long-term current use of insulin ([place]) 02/16/2022   Type 2 diabetes mellitus with hyperglycemia, with long-term current use of insulin ([place]) 02/16/2022   Resolved Ambulatory Problems    Diagnosis Date Noted   Fungal dermatitis 78/67/5449   DM w/o Complication Type II 20/02/711   Situational depression 06/04/2009   RECTAL BLEEDING 01/18/2009   URTICARIA 05/14/2008   Polymyalgia rheumatica ([place]) 01/18/2009   Headache 01/07/2008   Orthopnea 03/09/2009   Personal history of other diseases of the digestive system 12/03/2006   Cholelithiasis 06/06/2010   UTI 06/06/2010   Chest pain, unspecified 09/15/2010   Shortness of breath 10/12/2010   Hypotension 01/26/2012   Abdominal pain 01/26/2012   Elevated troponin 01/26/2012   Chest pain 01/26/2012   History of deep venous thrombosis 01/27/2012   Pulmonary embolus ([place]) 01/28/2012   Dysphagia, unspecified(787.20) 07/31/2012   GERD (gastroesophageal reflux disease) 07/31/2012   Debility 08/01/2012   Osteoarthritis 08/01/2012   DM (diabetes mellitus), type 2, uncontrolled, with renal complications 19/75/8832   Itching 01/08/2013   Need for prophylactic vaccination and inoculation against influenza 01/08/2013   Dysuria 12/31/2013   Pain in the chest 12/31/2013   Breast cancer screening 02/19/2014   Weight gain 02/19/2014   Sepsis secondary to UTI ([place]) 04/21/2014   Fever  04/21/2014   Sepsis ([place]) 04/21/2014   Acute pyelonephritis 04/22/2014   History of pulmonary embolus (PE) 04/22/2014   DOE (dyspnea on exertion) 06/24/2014   Abnormal stress test 09/27/2014   Preoperative clearance 08/16/2014   Chronic bronchitis ([place]) 09/23/2014    Vertigo 09/24/2014   Encounter for therapeutic drug monitoring 10/08/2014   Breast mass 10/27/2014   Morbid obesity with BMI of 50.0-59.9, adult ([place]) 12/18/2014   Chronic pain syndrome 02/06/2015   Cough variant asthma 11/17/2015   Vaginitis and vulvovaginitis 06/23/2016   Esophageal dysphagia 11/07/2016   Multiple benign nevi 11/15/2016   Anxiety associated with depression 06/08/2017   Hyperkalemia 07/26/2017   Acute on chronic diastolic CHF (congestive heart failure) ([place]) 10/08/2017   CHF (congestive heart failure) ([place]) 10/08/2017   Postmenopausal bleeding 06/04/2014   Pneumonia due to COVID-19 virus 01/25/2019   Acute respiratory failure with hypoxia ([place]) 01/29/2019   Pulmonary embolus ([place]) 07/15/2019   Follow-up examination after eye surgery 08/27/2019   Right epiretinal membrane 08/27/2019   Vitreomacular adhesion of right eye 08/27/2019   Diabetes mellitus without complication ([place]) 75/64/3329   Intermediate stage nonexudative age-related macular degeneration of both eyes 08/27/2019   Chest pain on exertion 02/14/2020   Non-STEMI (non-ST elevated myocardial infarction) ([place]) 02/15/2020   Presence of stent in right coronary artery    Pain management contract signed 04/12/2020   Syncope, vasovagal 07/04/2021   Syncope 07/04/2021   Acute on chronic heart failure with preserved ejection fraction (HFpEF) ([place]) 10/13/2021   Class 3 obesity ([place]) 10/22/2021   Past Medical History:  Diagnosis Date   Allergy    Anemia, unspecified    Arthritis    Chronic diastolic CHF (congestive heart failure) (HCC)    CKD (chronic kidney disease), stage III (HCC)    Coronary artery disease    Depression    Diabetes mellitus    Gout, unspecified    History of blood clots    Hypertension    Joint pain    Joint swelling    Morbid obesity ([place])    Myocardial infarction ([place]) 2004   Numbness    Obstructive sleep apnea    Osteoarthrosis, unspecified whether generalized or localized,  lower leg    Pain, chronic    Pulmonary emboli ([place]) 01/2012   Urinary incontinence    Wears dentures     SOCIAL HX:  Social History   Tobacco Use   Smoking status: Never    Passive exposure: Past   Smokeless tobacco: Never   Tobacco comments:    Both parents smoked, patient was exposed/ "Raised up in smoke, my whole life."  Substance Use Topics   Alcohol use: No    Alcohol/week: 0.0 standard drinks of alcohol     ALLERGIES:  Allergies  Allergen Reactions   Sulfonamide Derivatives Swelling and Other (See Comments)    Mouth swelling- no resp issues noted   Lokelma [Sodium Zirconium Cyclosilicate] Other (See Comments)    Headache, stomach upset   Aricept [Donepezil Hcl] Diarrhea and Nausea And Vomiting    GI upset/loose stools   Tramadol Nausea And Vomiting      PERTINENT MEDICATIONS:  Outpatient Encounter Medications as of 02/16/2022  Medication Sig   albuterol (PROAIR HFA) 108 (90 Base) MCG/ACT inhaler Inhale 1-2 puffs into the lungs every 6 (six) hours as needed for wheezing or shortness of breath.   anastrozole (ARIMIDEX) 1 MG tablet TAKE 1 TABLET BY MOUTH EVERY DAY   ASPIRIN LOW DOSE 81 MG EC  tablet TAKE 1 TABLET BY MOUTH EVERY DAY   atorvastatin (LIPITOR) 80 MG tablet TAKE 1 TABLET BY MOUTH EVERY DAY   budesonide-formoterol (SYMBICORT) 160-4.5 MCG/ACT inhaler Inhale 2 puffs into the lungs 2 (two) times daily.   carvedilol (COREG) 12.5 MG tablet TAKE 1 TABLET BY MOUTH 2 TIMES DAILY   cetirizine (ZYRTEC) 10 MG tablet Take 10 mg by mouth daily as needed for allergies or rhinitis.   clopidogrel (PLAVIX) 75 MG tablet Take 1 tablet (75 mg total) by mouth daily.   Continuous Blood Gluc Sensor (FREESTYLE LIBRE 14 DAY SENSOR) MISC PLACE 1 DEVICE ON THE SKIN AS DIRECTED EVERY 14 DAYS   diclofenac Sodium (VOLTAREN) 1 % GEL Apply 2 g topically 4 (four) times daily.   escitalopram (LEXAPRO) 20 MG tablet Take 1 tablet (20 mg total) by mouth daily.   febuxostat (ULORIC) 40 MG  tablet TAKE 1 TABLET BY MOUTH EVERY DAY   fluticasone (FLONASE) 50 MCG/ACT nasal spray Place 1 spray into both nostrils in the morning and at bedtime.   folic acid (FOLVITE) 1 MG tablet TAKE 1 TABLET BY MOUTH EVERY DAY   guaifenesin (ROBITUSSIN) 100 MG/5ML syrup Take 200 mg by mouth 3 (three) times daily as needed for cough.   hydrOXYzine (ATARAX) 25 MG tablet Take 1-2 tablets (25-50 mg total) by mouth every 8 (eight) hours as needed for anxiety or itching.   insulin aspart (NOVOLOG FLEXPEN) 100 UNIT/ML FlexPen Inject 14 Units into the skin 3 (three) times daily before meals.   insulin glargine, 2 Unit Dial, (TOUJEO MAX SOLOSTAR) 300 UNIT/ML Solostar Pen Inject 42 Units into the skin daily. Patient assistance program provides   linaclotide (LINZESS) 145 MCG CAPS capsule Take 1 capsule (145 mcg total) by mouth daily before breakfast.   meclizine (ANTIVERT) 25 MG tablet TAKE 1 TABLET BY MOUTH THREE TIMES DAILY AS NEEDED FOR DIZZINESS   memantine (NAMENDA) 5 MG tablet TAKE 2 TABLETS BY MOUTH EVERY DAY and TAKE 1 TABLET BY MOUTH EVERY EVENING   nitroGLYCERIN (NITROSTAT) 0.4 MG SL tablet Take one tablet under the tongue every 5 minutes as needed for chest pain   nystatin cream (MYCOSTATIN) Apply 1 application topically 2 (two) times daily.   OVER THE COUNTER MEDICATION Take 1 capsule by mouth every other day. Over the counter stool softener - unknown type   oxyCODONE-acetaminophen (PERCOCET) 7.5-325 MG tablet Take 1 tablet by mouth every 12 (twelve) hours as needed for moderate pain or severe pain. Must last 30 days.   pantoprazole (PROTONIX) 20 MG tablet TAKE 1 TABLET BY MOUTH EVERY DAY   sodium polystyrene (SPS) 15 GM/60ML suspension take 11ms by MOUTH ONCE WEEKLY. TO keep potassium down   torsemide (DEMADEX) 20 MG tablet TAKE 1 TABLET BY MOUTH EVERY DAY. Take an extra dose for weight gain, 3lb in 1 day or 5lbs in 1 week   Vitamin D, Ergocalciferol, (DRISDOL) 1.25 MG (50000 UNIT) CAPS capsule TAKE 1  CAPSULE BY MOUTH every 7 days   No facility-administered encounter medications on file as of 02/16/2022.    Thank you for the opportunity to participate in the care of Ms. Roache.  The palliative care team will continue to follow. Please call our office at [phone] we can be of additional assistance.   THollace Kinnier DO  COVID-19 PATIENT SCREENING TOOL Asked and negative response unless otherwise noted:  Have you had symptoms of covid, tested positive or been in contact with someone with symptoms/positive test in the past  5-10 days? no

## 2022-02-16 NOTE — Patient Instructions (Signed)
-   Continue  Toujeo  42 units daily  - Continue Novolog 14 units with Brunch  and 14 units with Supper  - Restart  Trulicity 1.5  mg weekly   - Novolog correctional insulin: ADD extra units on insulin to your meal-time Novolog dose if your blood sugars are higher than 180. Use the scale below to help guide you:   Blood sugar before meal Number of units to inject  Less than 180 0 unit  181 -  210 1 units  211 -  240 2 units  241 -  270 3 units  271 -  300 4 units  301 -  330 5 units  331 -  360 6 units  361 -  390 7 units     HOW TO TREAT LOW BLOOD SUGARS (Blood sugar LESS THAN 70 MG/DL) Please follow the RULE OF 15 for the treatment of hypoglycemia treatment (when your (blood sugars are less than 70 mg/dL)   STEP 1: Take 15 grams of carbohydrates when your blood sugar is low, which includes:  3-4 GLUCOSE TABS  OR 3-4 OZ OF JUICE OR REGULAR SODA OR ONE TUBE OF GLUCOSE GEL    STEP 2: RECHECK blood sugar in 15 MINUTES STEP 3: If your blood sugar is still low at the 15 minute recheck --> then, go back to STEP 1 and treat AGAIN with another 15 grams of carbohydrates.  HOW TO TREAT LOW BLOOD SUGARS (Blood sugar LESS THAN 70 MG/DL) Please follow the RULE OF 15 for the treatment of hypoglycemia treatment (when your (blood sugars are less than 70 mg/dL)   STEP 1: Take 15 grams of carbohydrates when your blood sugar is low, which includes:  3-4 GLUCOSE TABS  OR 3-4 OZ OF JUICE OR REGULAR SODA OR ONE TUBE OF GLUCOSE GEL    STEP 2: RECHECK blood sugar in 15 MINUTES STEP 3: If your blood sugar is still low at the 15 minute recheck --> then, go back to STEP 1 and treat AGAIN with another 15 grams of carbohydrates.

## 2022-02-16 NOTE — Progress Notes (Signed)
Name: [name]  Age/ Sex: 87 y.o., female   MRN/ DOB: [id number], 1934/06/09     PCP: Bonnita Hollow, MD   Reason for Endocrinology Evaluation: Type 2 Diabetes Mellitus     Initial Endocrinology Clinic Visit: 08/19/2018    PATIENT IDENTIFIER: [name] is a 87 y.o. female with a past medical history of T2DM, CKD III, and chronic pain syndrome . The patient has followed with Endocrinology clinic since 08/19/2018 for consultative assistance with management of her diabetes.  DIABETIC HISTORY:  [name] was diagnosed with T2DM > 40 yrs ago. [name] is a poor historian and unable to recall oral glycemic agents. [name] has been on insulin therapy for years. Her hemoglobin A1c has ranged from 6.7%  in 07/2017, peaking at 12.1% in 2018.  SUBJECTIVE:   During the last visit (04/06/2021): A1c 8.5 %    Today (02/16/2022): [name] is here for a follow up visit on diabetes management. [name] is accompanied by her daughter. They use the CGM at home . [name] has been noted with hypoglycemia on freestyle libre    [name] has occasional nausea but no vomiting Patient with chronic constipation, [name] recently presented to the ED with abdominal pain [name] has been without Trulicity for almost a year as it is cost prohibitive   HOME DIABETES REGIMEN:  Toujeo 42  units daily  Novolog 14 units with Brunch and 14 units with Supper  Trulicity 1.5  mg weekly  CF : BG- 150/30    CONTINUOUS GLUCOSE MONITORING RECORD INTERPRETATION    Dates of Recording: 9/29-10/04/2022  Sensor description:freestyle libre  Results statistics:   CGM use % of time 40  Average and SD 141/44.5  Time in range   53   %  % Time Above 180 25  % Time above 250 5  % Time Below target 14     Glycemic patterns summary: [name] noted with hyperglycemia during the day as usual , BG's trend down at night   Hyperglycemic episodes  all day   Hypoglycemic episodes occurred at night but they seem to be sensor specific,  as [name] was having them early in the month but none recently   Overnight periods: variable         HISTORY:  Past Medical History:  Past Medical History:  Diagnosis Date   Abdominal pain 01/26/2012   Acute kidney injury superimposed on chronic kidney disease ([place]) 12/18/2014   Allergy    takes Mucinex daily as needed   Anemia, unspecified    Anxiety    takes Clonazepam daily as needed   Anxiety associated with depression 06/08/2017   Arthritis    Breast cancer ([place])    Breast cancer screening 02/19/2014   Breast mass 10/27/2014   Chronic diastolic CHF (congestive heart failure) ([place])    takes Furosemide daily   Chronic pain syndrome 02/06/2015   CKD (chronic kidney disease), stage III ([place])    Coronary artery disease    a. s/p IMI 2004 tx with BMS to RCA;  b. s/p Promus DES to RCA 2/12 c. abnormal nuc 2016 -> cath with med rx. d. NSTEMI 02/2020  mv CAD with severe ISR in pRCA and m/dRCA lesion both treated with DES.   Debility 08/01/2012   Dementia ([place])    Depression    takes Cymbalta daily   Diabetes mellitus    insulin daily   DOE (dyspnea on exertion) 06/24/2014  Dysphagia, unspecified(787.20) 07/31/2012   Fungal dermatitis 11/13/2007   Qualifier: Diagnosis of  By: Burnice Logan  MD, Doretha Sou    GERD (gastroesophageal reflux disease)    takes Protonix daily   Gout, unspecified    Headache    occasionally   History of blood clots    History of deep venous thrombosis 01/27/2012   History of pulmonary embolus (PE) 04/22/2014   Hyperkalemia 07/26/2017   Hyperlipidemia    takes Pravastatin daily   Hypertension    Insomnia    Joint pain    Joint swelling    Morbid obesity ([place])    Multiple benign nevi 11/15/2016   Myocardial infarction ([place]) 2004   Non-STEMI (non-ST elevated myocardial infarction) ([place]) 02/15/2020   Numbness    Obstructive sleep apnea    does not wear cpap   Osteoarthritis    Osteoarthritis    Osteoarthrosis, unspecified whether generalized or  localized, lower leg    Pain in the chest 12/31/2013   Pain, chronic    Personal history of other diseases of the digestive system 12/03/2006   Qualifier: Diagnosis of  By: Burnice Logan  MD, Doretha Sou    Polymyalgia rheumatica [facility])    Presence of stent in right coronary artery    Pulmonary emboli ([place]) 01/2012   felt to need lifelong anticoagulation but Xarelto dc in 02/2020 due to need for DAPT   Situational depression 06/04/2009   Qualifier: Diagnosis of  By: Burnice Logan  MD, Doretha Sou    Syncope, vasovagal 07/04/2021   Urinary incontinence    takes Linzess daily   Vaginitis and vulvovaginitis 06/23/2016   Vertigo    hx of;was taking Meclizine if needed   Wears dentures    Past Surgical History:  Past Surgical History:  Procedure Laterality Date   ABDOMINAL HYSTERECTOMY     partial   APPENDECTOMY     blood clots/legs and lungs  2013   BREAST BIOPSY Left 07/22/2014   BREAST BIOPSY Left 02/10/2013   BREAST LUMPECTOMY Left 11/05/2014   BREAST LUMPECTOMY WITH RADIOACTIVE SEED LOCALIZATION Left 11/05/2014   Procedure: LEFT BREAST LUMPECTOMY WITH RADIOACTIVE SEED LOCALIZATION;  Surgeon: [name], MD;  Location: [place];  Service: General;  Laterality: Left;   CARDIAC CATHETERIZATION     COLONOSCOPY     CORONARY ANGIOPLASTY  2   CORONARY STENT INTERVENTION N/A 02/17/2020   Procedure: CORONARY STENT INTERVENTION;  Surgeon: Leonie Man, MD;  Location: [place] [facility];  Service: Cardiovascular;  Laterality: N/A;   ESOPHAGOGASTRODUODENOSCOPY (EGD) WITH PROPOFOL N/A 11/07/2016   Procedure: ESOPHAGOGASTRODUODENOSCOPY (EGD) WITH PROPOFOL;  Surgeon: Gatha Mayer, MD;  Location: [facility];  Service: Endoscopy;  Laterality: N/A;   EXCISION OF SKIN TAG Right 11/05/2014   Procedure: EXCISION OF RIGHT EYELID SKIN TAG;  Surgeon: [name], MD;  Location: [place];  Service: General;  Laterality: Right;   EYE SURGERY Bilateral    cataract    GASTRIC BYPASS  1977    reversed in 1979,  [place] CATH AND CORONARY ANGIOGRAPHY N/A 02/17/2020   Procedure: LEFT HEART CATH AND CORONARY ANGIOGRAPHY;  Surgeon: Leonie Man, MD;  Location: [place] [facility];  Service: Cardiovascular;  Laterality: N/A;   LEFT HEART CATHETERIZATION WITH CORONARY ANGIOGRAM N/A 06/29/2014   Procedure: LEFT HEART CATHETERIZATION WITH CORONARY ANGIOGRAM;  Surgeon: Troy Sine, MD;  Location: [facility] CATH LAB;  Service: Cardiovascular;  Laterality: N/A;   MEMBRANE PEEL Right 10/23/2018   Procedure:  MEMBRANE PEEL;  Surgeon: Hurman Horn, MD;  Location: [place];  Service: Ophthalmology;  Laterality: Right;   MI with stent placement  2004   PARS PLANA VITRECTOMY Right 10/23/2018   Procedure: PARS PLANA VITRECTOMY WITH 25 GAUGE;  Surgeon: Hurman Horn, MD;  Location: [place];  Service: Ophthalmology;  Laterality: Right;   Social History:  reports that [name] has never smoked. [name] has been exposed to tobacco smoke. [name] has never used smokeless tobacco. [name] reports that [name] does not drink alcohol and does not use drugs. Family History:  Family History  Problem Relation Age of Onset   Breast cancer Mother 3   Heart disease Mother    Throat cancer Father    Hypertension Father    Arthritis Father    Diabetes Father    Arthritis Sister    Obesity Sister    Diabetes Sister    Heart disease Cousin    Colon cancer Neg Hx    Stomach cancer Neg Hx    Esophageal cancer Neg Hx      HOME MEDICATIONS: Allergies as of 02/16/2022       Reactions   Sulfonamide Derivatives Swelling, Other (See Comments)   Mouth swelling- no resp issues noted   Lokelma [sodium Zirconium Cyclosilicate] Other (See Comments)   Headache, stomach upset   Aricept [donepezil Hcl] Diarrhea, Nausea And Vomiting   GI upset/loose stools   Tramadol Nausea And Vomiting        Medication List        Accurate as of February 16, 2022 10:30 AM. If you have any questions, ask your nurse or doctor.          albuterol  108 (90 Base) MCG/ACT inhaler Commonly known as: ProAir HFA Inhale 1-2 puffs into the lungs every 6 (six) hours as needed for wheezing or shortness of breath.   anastrozole 1 MG tablet Commonly known as: ARIMIDEX TAKE 1 TABLET BY MOUTH EVERY DAY   Aspirin Low Dose 81 MG tablet Generic drug: aspirin EC TAKE 1 TABLET BY MOUTH EVERY DAY   atorvastatin 80 MG tablet Commonly known as: LIPITOR TAKE 1 TABLET BY MOUTH EVERY DAY   budesonide-formoterol 160-4.5 MCG/ACT inhaler Commonly known as: SYMBICORT Inhale 2 puffs into the lungs 2 (two) times daily.   carvedilol 12.5 MG tablet Commonly known as: COREG TAKE 1 TABLET BY MOUTH 2 TIMES DAILY   cetirizine 10 MG tablet Commonly known as: ZYRTEC Take 10 mg by mouth daily as needed for allergies or rhinitis.   clopidogrel 75 MG tablet Commonly known as: PLAVIX Take 1 tablet (75 mg total) by mouth daily.   diclofenac Sodium 1 % Gel Commonly known as: Voltaren Apply 2 g topically 4 (four) times daily.   escitalopram 20 MG tablet Commonly known as: LEXAPRO Take 1 tablet (20 mg total) by mouth daily.   febuxostat 40 MG tablet Commonly known as: ULORIC TAKE 1 TABLET BY MOUTH EVERY DAY   fluticasone 50 MCG/ACT nasal spray Commonly known as: FLONASE Place 1 spray into both nostrils in the morning and at bedtime.   folic acid 1 MG tablet Commonly known as: FOLVITE TAKE 1 TABLET BY MOUTH EVERY DAY   FreeStyle Libre 14 Day Sensor Misc PLACE 1 DEVICE ON THE SKIN AS DIRECTED EVERY 14 DAYS   guaifenesin 100 MG/5ML syrup Commonly known as: ROBITUSSIN Take 200 mg by mouth 3 (three) times daily as needed for cough.   hydrOXYzine 25 MG tablet Commonly known as:  ATARAX Take 1-2 tablets (25-50 mg total) by mouth every 8 (eight) hours as needed for anxiety or itching.   linaclotide 145 MCG Caps capsule Commonly known as: LINZESS Take 1 capsule (145 mcg total) by mouth daily before breakfast.   meclizine 25 MG tablet Commonly known  as: ANTIVERT TAKE 1 TABLET BY MOUTH THREE TIMES DAILY AS NEEDED FOR DIZZINESS   memantine 5 MG tablet Commonly known as: NAMENDA TAKE 2 TABLETS BY MOUTH EVERY DAY and TAKE 1 TABLET BY MOUTH EVERY EVENING   nitroGLYCERIN 0.4 MG SL tablet Commonly known as: Nitrostat Take one tablet under the tongue every 5 minutes as needed for chest pain   NovoLOG FlexPen 100 UNIT/ML FlexPen Generic drug: insulin aspart Inject 14 Units into the skin 3 (three) times daily before meals.   nystatin cream Commonly known as: MYCOSTATIN Apply 1 application topically 2 (two) times daily.   OVER THE COUNTER MEDICATION Take 1 capsule by mouth every other day. Over the counter stool softener - unknown type   oxyCODONE-acetaminophen 7.5-325 MG tablet Commonly known as: Percocet Take 1 tablet by mouth every 12 (twelve) hours as needed for moderate pain or severe pain. Must last 30 days.   pantoprazole 20 MG tablet Commonly known as: PROTONIX TAKE 1 TABLET BY MOUTH EVERY DAY   sodium polystyrene 15 GM/60ML suspension Commonly known as: SPS take 47ms by MOUTH ONCE WEEKLY. TO keep potassium down   torsemide 20 MG tablet Commonly known as: DEMADEX TAKE 1 TABLET BY MOUTH EVERY DAY. Take an extra dose for weight gain, 3lb in 1 day or 5lbs in 1 week   Toujeo Max SoloStar 300 UNIT/ML Solostar Pen Generic drug: insulin glargine (2 Unit Dial) Inject 42 Units into the skin daily. Patient assistance program provides   Vitamin D (Ergocalciferol) 1.25 MG (50000 UNIT) Caps capsule Commonly known as: DRISDOL TAKE 1 CAPSULE BY MOUTH every 7 days        PHYSICAL EXAM: VS: BP 120/72 (BP Location: Left Arm, Patient Position: Sitting, Cuff Size: Large)   Pulse 72   Ht '5\' 7"'$  (1.702 m)   Wt 295 lb (133.8 kg)   SpO2 97%   BMI 46.20 kg/m    EXAM: General: [name] appears well and is in NAD in a wheelchair   Lungs: Clear with good BS bilat with no rales, rhonchi, or wheezes  Heart: Auscultation: RRR    Extremities:  BL LE: No pretibial edema    DM Foot Exam 09/29/2020   The skin of the feet is intact without sores or ulcerations. The pedal pulses are 1+ on right and 1+ on left. The sensation is undetectable  to a screening 5.07, 10 gram monofilament bilaterally   DATA REVIEWED:  Lab Results  Component Value Date   HGBA1C 7.1 (H) 02/12/2022   HGBA1C 7.0 (H) 07/04/2021   HGBA1C 8.5 (A) 04/06/2021     Latest Reference Range & Units 02/12/22 07:22  Sodium 135 - 145 mmol/L 140  Potassium 3.5 - 5.1 mmol/L 4.7  Chloride 98 - 111 mmol/L 108  CO2 22 - 32 mmol/L 24  Glucose 70 - 99 mg/dL 191 (H)  Mean Plasma Glucose mg/dL 157.07  BUN 8 - 23 mg/dL 66 (H)  Creatinine 0.44 - 1.00 mg/dL 2.11 (H)  Calcium 8.9 - 10.3 mg/dL 8.7 (L)  Anion gap 5 - 15  8  Alkaline Phosphatase 38 - 126 U/L 65  Albumin 3.5 - 5.0 g/dL 2.9 (L)  AST 15 - 41 U/L 13 (  L)  ALT 0 - 44 U/L 10  Total Protein 6.5 - 8.1 g/dL 7.0  Total Bilirubin 0.3 - 1.2 mg/dL 0.3  GFR, Estimated >60 mL/min 23 (L)    ASSESSMENT / PLAN / RECOMMENDATIONS:   1)Type 2 Diabetes Mellitus, Sub-Optimally  Controlled, With Neuropathic, CKD IV and macrovascular complications - Most recent A1c of 7.1 %. Goal A1c < 7.5 %.      - A1c is skewed due to CKD - Main barriers to diabetes care is memory loss.  -[name] is accompanied by her daughter today but I had to speak over the phone to her main caregiver [name]) regarding Trulicity, they have not been able to get it due to cost -Earlier in the month [name] has been noted with hypoglycemia, question accuracy of freestyle libre, as it seems this may be sensitive specific, her recent BG's have been >200 mg/dL without hypoglycemia -That were provided with another patient assistance forms for Trulicity, this may improve her constipation hence improve abdominal pain -No changes to insulin regimen at this time  MEDICATIONS: Continue Toujeo 42  units daily  Continue Novolog 14 units Twith Brunch and 14  units with Supper  We will attempt to start Trulicity 1.5  mg weekly  Continue CF : BG- 150/30  EDUCATION / INSTRUCTIONS: BG monitoring instructions: Patient is instructed to check her blood sugars 4 times a day, before meals and bedtime. Call [place] [facility] clinic if: BG persistently < 70  I reviewed the Rule of 15 for the treatment of hypoglycemia in detail with the patient. Literature supplied.    F/U in 6 months    Signed electronically by: Mack Guise, MD  [facility] Endocrinology  [address] [place], [place] [address] Phone: [phone] FAX: [phone]   CC: [address], [place] [address] Phone: [phone]  Fax: [phone]  Return to Endocrinology clinic as below: Future Appointments  Date Time Provider [place]  02/16/2022  1:00 PM Mariea Clonts, [place], DO [facility] None  02/21/2022  1:20 PM Bonnita Hollow, MD [facility]  02/27/2022  1:15 PM Marzetta Board, DPM [facility]  03/15/2022  2:00 PM [name], DPM [facility]  03/21/2022 11:00 AM Gillis Santa, MD [facility] None  05/15/2022  3:10 PM Marylu Lund., NP [facility]  06/12/2022  2:00 PM Freada Bergeron, MD [facility]  07/11/2022  1:00 PM [facility] None  07/11/2022  1:30 PM [name], MD [facility] None

## 2022-02-17 ENCOUNTER — Encounter: Payer: Self-pay | Admitting: Podiatry

## 2022-02-17 ENCOUNTER — Telehealth: Payer: Self-pay | Admitting: Family Medicine

## 2022-02-17 NOTE — Telephone Encounter (Signed)
Advised Will of annotation below and he verbalized understanding.

## 2022-02-17 NOTE — Telephone Encounter (Signed)
[name] is needing verbal orders of adding an additional day to complete her medicare evaluation for next week. His vm is secure. Nichole  @ [phone].

## 2022-02-21 ENCOUNTER — Telehealth: Payer: Self-pay | Admitting: Family Medicine

## 2022-02-21 ENCOUNTER — Inpatient Hospital Stay: Payer: HMO | Admitting: Family Medicine

## 2022-02-21 NOTE — Telephone Encounter (Signed)
Advised Will of annotation and he verbalized understanding.

## 2022-02-21 NOTE — Telephone Encounter (Signed)
Will from [facility] called for verbal orders [phone])   1- 2times a wk and 3 times a wk, request OT also

## 2022-02-22 ENCOUNTER — Telehealth: Payer: Self-pay

## 2022-02-22 NOTE — Telephone Encounter (Signed)
(  3:30pm ) PC SW completed a follow-up call to patient's daughter-[name] per request of Dr. Mariea Clonts to provide resources and support to her. SW provided two resources for in-home care that [name] advised she will follow-up on. SW also encouraged her to discuss in-home care needs with [facility] nurse/SW. SW will also mail a HCPOA form to her.  No other concerns were noted.  Dr. Mariea Clonts will be updated on follow-up.

## 2022-02-23 ENCOUNTER — Ambulatory Visit (INDEPENDENT_AMBULATORY_CARE_PROVIDER_SITE_OTHER): Payer: HMO | Admitting: Family Medicine

## 2022-02-23 ENCOUNTER — Encounter: Payer: Self-pay | Admitting: Family Medicine

## 2022-02-23 ENCOUNTER — Telehealth: Payer: Self-pay | Admitting: Family Medicine

## 2022-02-23 VITALS — BP 136/84 | HR 77 | Temp 97.8°F | Wt 295.0 lb

## 2022-02-23 DIAGNOSIS — G8929 Other chronic pain: Secondary | ICD-10-CM

## 2022-02-23 DIAGNOSIS — M79641 Pain in right hand: Secondary | ICD-10-CM

## 2022-02-23 DIAGNOSIS — H6592 Unspecified nonsuppurative otitis media, left ear: Secondary | ICD-10-CM | POA: Diagnosis not present

## 2022-02-23 DIAGNOSIS — R42 Dizziness and giddiness: Secondary | ICD-10-CM | POA: Diagnosis not present

## 2022-02-23 DIAGNOSIS — K838 Other specified diseases of biliary tract: Secondary | ICD-10-CM

## 2022-02-23 DIAGNOSIS — Z09 Encounter for follow-up examination after completed treatment for conditions other than malignant neoplasm: Secondary | ICD-10-CM | POA: Diagnosis not present

## 2022-02-23 DIAGNOSIS — M159 Polyosteoarthritis, unspecified: Secondary | ICD-10-CM

## 2022-02-23 MED ORDER — PREDNISONE 20 MG PO TABS: 20.0000 mg | ORAL_TABLET | Freq: Every day | ORAL | 0 refills | Status: AC

## 2022-02-23 MED ORDER — AMOXICILLIN-POT CLAVULANATE 875-125 MG PO TABS: 1.0000 | ORAL_TABLET | Freq: Two times a day (BID) | ORAL | 0 refills | Status: AC

## 2022-02-23 MED ORDER — ALPRAZOLAM 0.5 MG PO TABS: 0.5000 mg | ORAL_TABLET | Freq: Three times a day (TID) | ORAL | 0 refills | Status: DC | PRN

## 2022-02-23 MED ORDER — DICLOFENAC SODIUM 1 % EX GEL: 2.0000 g | Freq: Four times a day (QID) | CUTANEOUS | 2 refills | Status: DC

## 2022-02-23 NOTE — Progress Notes (Signed)
Assessment/Plan:   Problem List Items Addressed This Visit       Digestive   Pneumobilia   Other Visit Diagnoses     Vertigo    -  Primary   Relevant Medications   ALPRAZolam (XANAX) 0.5 MG tablet   predniSONE (DELTASONE) 20 MG tablet   Fluid level behind tympanic membrane of left ear       Relevant Medications   ALPRAZolam (XANAX) 0.5 MG tablet   predniSONE (DELTASONE) 20 MG tablet   amoxicillin-clavulanate (AUGMENTIN) 875-125 MG tablet   Chronic hand pain, right       Relevant Medications   predniSONE (DELTASONE) 20 MG tablet   Primary osteoarthritis involving multiple joints       Relevant Medications   predniSONE (DELTASONE) 20 MG tablet   diclofenac Sodium (VOLTAREN) 1 % GEL   Hospital discharge follow-up          Vertigo in the setting of left ear effusion, possible otitis media Alprazolam for dizziness Prednisone and Augmentin Low suspicion for CVA or other intracranial abnormality at this time,, however patient is high risk, low threshold to pursue any assessment and morbidities neuroimaging based on symptoms Follow-up no improvement  Return precautions discussed    Subjective:  HPI:  [name] is a 87 y.o. female who has Hyperlipidemia; Class 3 obesity ([place]); Microcytic anemia; TENSION HEADACHE; Essential hypertension; Chronic diastolic congestive heart failure ([place]); Long term current use of anticoagulant therapy; Insomnia; Estrogen deficiency; COPD with chronic bronchitis; CN (constipation); Gout; Acute kidney injury superimposed on chronic kidney disease ([place]); Vitamin D deficiency; Endometrial polyp; GERD with esophagitis; Dementia ([place]); Type 2 diabetes mellitus with hyperlipidemia ([place]); Moderate nonproliferative diabetic retinopathy of both eyes without macular edema associated with type 2 diabetes mellitus ([place]); Chronic radicular lumbar pain; Lumbar spondylosis; Spinal stenosis, lumbar region, with neurogenic claudication; Bilateral primary  osteoarthritis of knee; Iron deficiency anemia; Right elbow pain; Chronic pain of right hand; Breast cancer ([place]); Anxiety; Acute on chronic diastolic (congestive) heart failure ([place]); AKI (acute kidney injury) ([place]); Severe episode of recurrent major depressive disorder, without psychotic features ([place]); Pneumobilia; Dermatosis papulosa nigra; Positive D dimer; CKD (chronic kidney disease), stage IV ([place]); Type 2 diabetes mellitus with stage 4 chronic kidney disease, with long-term current use of insulin ([place]); Type 2 diabetes mellitus with diabetic neuropathy, with long-term current use of insulin ([place]); and Type 2 diabetes mellitus with hyperglycemia, with long-term current use of insulin ([place]) on their problem list..   She  has a past medical history of Abdominal pain (01/26/2012), Acute kidney injury superimposed on chronic kidney disease ([place]) (12/18/2014), Allergy, Anemia, unspecified, Anxiety, Anxiety associated with depression (06/08/2017), Arthritis, Breast cancer ([place]), Breast cancer screening (02/19/2014), Breast mass (10/27/2014), Chronic diastolic CHF (congestive heart failure) ([place]), Chronic pain syndrome (02/06/2015), CKD (chronic kidney disease), stage III ([place]), Coronary artery disease, Debility (08/01/2012), Dementia ([place]), Depression, Diabetes mellitus, DOE (dyspnea on exertion) (06/24/2014), Dysphagia, unspecified(787.20) (07/31/2012), Fungal dermatitis (11/13/2007), GERD (gastroesophageal reflux disease), Gout, unspecified, Headache, History of blood clots, History of deep venous thrombosis (01/27/2012), History of pulmonary embolus (PE) (04/22/2014), Hyperkalemia (07/26/2017), Hyperlipidemia, Hypertension, Insomnia, Joint pain, Joint swelling, Morbid obesity ([place]), Multiple benign nevi (11/15/2016), Myocardial infarction ([place]) (2004), Non-STEMI (non-ST elevated myocardial infarction) ([place]) (02/15/2020), Numbness, Obstructive sleep apnea, Osteoarthritis, Osteoarthritis, Osteoarthrosis, unspecified whether  generalized or localized, lower leg, Pain in the chest (12/31/2013), Pain, chronic, Personal history of other diseases of the digestive system (12/03/2006), Polymyalgia rheumatica ([place]), Presence of stent in right coronary artery, Pulmonary  emboli ([place]) (01/2012), Situational depression (06/04/2009), Syncope, vasovagal (07/04/2021), Urinary incontinence, Vaginitis and vulvovaginitis (06/23/2016), Vertigo, and Wears dentures..   She presents with chief complaint of Hospitalization Follow-up (Patient states that she feels dizzy like the room is spinning. Rx refill diclofenac) .  Declines repeat blood work  Follow up Hospitalization  Patient was admitted 02/08/2022 and discharged on 02/12/2022. She was treated for pneumobilia and acute heart failure exacerbation. Treatment for this included IV antibiotics and diuresis.  Discharged on oral metronidazole.  She reports good compliance with treatment. She reports improvement in abdominal pain, shortness of breath and chest pain.  She denies any fevers, chills,  vomiting, diarrhea, constipation, melena, hematochezia.   ----------------------------------------------------------------------------------------- - Chronic right hand pain secondary to osteoarthritis.  Pain improved with diclofenac.  Patient requesting refill.  Vertigo Patient presents for evaluation of dizziness.  Patient with several year history of vertigo.  The symptoms started a week ago and have gradually improved. The attacks occur  constant  . Positions that worsen symptoms: turning head. Previous workup/treatments: CT scan of head in 06/2021 with microvascular changes and mild atrophy, without any associated otic abnormalities visualized . Associated ear symptoms: aural pressure of left ear.  Patient denies symptoms: confusion, dimming vision, drowsiness, headaches, paresthesias, personality change, speech change, unconsciousness, and visual floaters. Recent infections: upper respiratory  infection with rhinitis. Head trauma: denied. Noise exposure: no occupational exposure.  Patient tried meclizine, but did not have any improvement.   Past Surgical History:  Procedure Laterality Date   ABDOMINAL HYSTERECTOMY     partial   APPENDECTOMY     blood clots/legs and lungs  2013   BREAST BIOPSY Left 07/22/2014   BREAST BIOPSY Left 02/10/2013   BREAST LUMPECTOMY Left 11/05/2014   BREAST LUMPECTOMY WITH RADIOACTIVE SEED LOCALIZATION Left 11/05/2014   Procedure: LEFT BREAST LUMPECTOMY WITH RADIOACTIVE SEED LOCALIZATION;  Surgeon: [name], MD;  Location: [facility];  Service: General;  Laterality: Left;   CARDIAC CATHETERIZATION     COLONOSCOPY     CORONARY ANGIOPLASTY  2   CORONARY STENT INTERVENTION N/A 02/17/2020   Procedure: CORONARY STENT INTERVENTION;  Surgeon: Leonie Man, MD;  Location: [facility];  Service: Cardiovascular;  Laterality: N/A;   ESOPHAGOGASTRODUODENOSCOPY (EGD) WITH PROPOFOL N/A 11/07/2016   Procedure: ESOPHAGOGASTRODUODENOSCOPY (EGD) WITH PROPOFOL;  Surgeon: Gatha Mayer, MD;  Location: [facility];  Service: Endoscopy;  Laterality: N/A;   EXCISION OF SKIN TAG Right 11/05/2014   Procedure: EXCISION OF RIGHT EYELID SKIN TAG;  Surgeon: [name], MD;  Location: [facility];  Service: General;  Laterality: Right;   EYE SURGERY Bilateral    cataract    GASTRIC BYPASS  1977    reversed in 1979, [place] CATH AND CORONARY ANGIOGRAPHY N/A 02/17/2020   Procedure: LEFT HEART CATH AND CORONARY ANGIOGRAPHY;  Surgeon: Leonie Man, MD;  Location: [facility];  Service: Cardiovascular;  Laterality: N/A;   LEFT HEART CATHETERIZATION WITH CORONARY ANGIOGRAM N/A 06/29/2014   Procedure: LEFT HEART CATHETERIZATION WITH CORONARY ANGIOGRAM;  Surgeon: Troy Sine, MD;  Location: [facility];  Service: Cardiovascular;  Laterality: N/A;   MEMBRANE PEEL Right 10/23/2018   Procedure: MEMBRANE PEEL;  Surgeon: Hurman Horn, MD;   Location: [facility];  Service: Ophthalmology;  Laterality: Right;   MI with stent placement  2004   PARS PLANA VITRECTOMY Right 10/23/2018   Procedure: PARS PLANA VITRECTOMY WITH 25 GAUGE;  Surgeon: Hurman Horn, MD;  Location: [facility];  Service: Ophthalmology;  Laterality: Right;    Outpatient Medications Prior to Visit  Medication Sig Dispense Refill   albuterol (PROAIR HFA) 108 (90 Base) MCG/ACT inhaler Inhale 1-2 puffs into the lungs every 6 (six) hours as needed for wheezing or shortness of breath. 6.7 g 1   anastrozole (ARIMIDEX) 1 MG tablet TAKE 1 TABLET BY MOUTH EVERY DAY 28 tablet 1   ASPIRIN LOW DOSE 81 MG EC tablet TAKE 1 TABLET BY MOUTH EVERY DAY 30 tablet 11   atorvastatin (LIPITOR) 80 MG tablet TAKE 1 TABLET BY MOUTH EVERY DAY 90 tablet 3   budesonide-formoterol (SYMBICORT) 160-4.5 MCG/ACT inhaler Inhale 2 puffs into the lungs 2 (two) times daily. 1 each 3   carvedilol (COREG) 12.5 MG tablet TAKE 1 TABLET BY MOUTH 2 TIMES DAILY 180 tablet 1   cetirizine (ZYRTEC) 10 MG tablet Take 10 mg by mouth daily as needed for allergies or rhinitis.     clopidogrel (PLAVIX) 75 MG tablet Take 1 tablet (75 mg total) by mouth daily. 90 tablet 3   Continuous Blood Gluc Sensor (FREESTYLE LIBRE 14 DAY SENSOR) MISC PLACE 1 DEVICE ON THE SKIN AS DIRECTED EVERY 14 DAYS 6 each 0   escitalopram (LEXAPRO) 20 MG tablet Take 0.5 tablets (10 mg total) by mouth daily for 7 days. Then stop 3.5 tablet 0   febuxostat (ULORIC) 40 MG tablet TAKE 1 TABLET BY MOUTH EVERY DAY 30 tablet 5   fluticasone (FLONASE) 50 MCG/ACT nasal spray Place 1 spray into both nostrils in the morning and at bedtime. 28.3 mL 3   folic acid (FOLVITE) 1 MG tablet TAKE 1 TABLET BY MOUTH EVERY DAY 90 tablet 1   guaifenesin (ROBITUSSIN) 100 MG/5ML syrup Take 200 mg by mouth 3 (three) times daily as needed for cough.     hydrOXYzine (ATARAX) 25 MG tablet Take 1-2 tablets (25-50 mg total) by mouth every 8 (eight) hours as needed for anxiety or  itching. 120 tablet 0   insulin aspart (NOVOLOG FLEXPEN) 100 UNIT/ML FlexPen Inject 14 Units into the skin 3 (three) times daily before meals.     insulin glargine, 2 Unit Dial, (TOUJEO MAX SOLOSTAR) 300 UNIT/ML Solostar Pen Inject 42 Units into the skin daily. Patient assistance program provides 15 mL 6   linaclotide (LINZESS) 145 MCG CAPS capsule Take 1 capsule (145 mcg total) by mouth daily before breakfast. 30 capsule 2   meclizine (ANTIVERT) 25 MG tablet TAKE 1 TABLET BY MOUTH THREE TIMES DAILY AS NEEDED FOR DIZZINESS 30 tablet 11   memantine (NAMENDA) 5 MG tablet TAKE 2 TABLETS BY MOUTH EVERY DAY and TAKE 1 TABLET BY MOUTH EVERY EVENING 270 tablet 5   nitroGLYCERIN (NITROSTAT) 0.4 MG SL tablet Take one tablet under the tongue every 5 minutes as needed for chest pain 50 tablet 0   nystatin cream (MYCOSTATIN) Apply 1 Application topically 2 (two) times daily. 30 g 3   OVER THE COUNTER MEDICATION Take 1 capsule by mouth every other day. Over the counter stool softener - unknown type     oxyCODONE-acetaminophen (PERCOCET) 7.5-325 MG tablet Take 1 tablet by mouth every 12 (twelve) hours as needed for moderate pain or severe pain. Must last 30 days. 60 tablet 0   pantoprazole (PROTONIX) 20 MG tablet TAKE 1 TABLET BY MOUTH EVERY DAY 30 tablet 5   sodium polystyrene (SPS) 15 GM/60ML suspension take 68ms by MOUTH ONCE WEEKLY. TO keep potassium down 240 mL 2   torsemide (  DEMADEX) 20 MG tablet TAKE 1 TABLET BY MOUTH EVERY DAY. Take an extra dose for weight gain, 3lb in 1 day or 5lbs in 1 week 30 tablet 0   traZODone (DESYREL) 50 MG tablet Take 0.5 tablets (25 mg total) by mouth at bedtime. 45 tablet 3   Vitamin D, Ergocalciferol, (DRISDOL) 1.25 MG (50000 UNIT) CAPS capsule TAKE 1 CAPSULE BY MOUTH every 7 days 4 capsule 1   diclofenac Sodium (VOLTAREN) 1 % GEL Apply 2 g topically 4 (four) times daily. 150 g 2   No facility-administered medications prior to visit.    Family History  Problem Relation  Age of Onset   Breast cancer Mother 59   Heart disease Mother    Throat cancer Father    Hypertension Father    Arthritis Father    Diabetes Father    Arthritis Sister    Obesity Sister    Diabetes Sister    Heart disease Cousin    Colon cancer Neg Hx    Stomach cancer Neg Hx    Esophageal cancer Neg Hx     Social History   Socioeconomic History   Marital status: Widowed    Spouse name: Not on file   Number of children: 3   Years of education: Not on file   Highest education level: High school graduate  Occupational History   Occupation: retired  Tobacco Use   Smoking status: Never    Passive exposure: Past   Smokeless tobacco: Never   Tobacco comments:    Both parents smoked, patient was exposed/ "Raised up in smoke, my whole life."  Vaping Use   Vaping Use: Never used  Substance and Sexual Activity   Alcohol use: No    Alcohol/week: 0.0 standard drinks of alcohol   Drug use: No   Sexual activity: Not Currently  Other Topics Concern   Not on file  Social History Narrative   Not on file   Social Determinants of Health   Financial Resource Strain: Low Risk  (10/14/2021)   Overall Financial Resource Strain (CARDIA)    Difficulty of Paying Living Expenses: Not very hard  Food Insecurity: No Food Insecurity (02/09/2022)   Hunger Vital Sign    Worried About Running Out of Food in the Last Year: Never true    Ran Out of Food in the Last Year: Never true  Transportation Needs: No Transportation Needs (02/09/2022)   PRAPARE - Hydrologist (Medical): No    Lack of Transportation (Non-Medical): No  Physical Activity: Inactive (07/26/2017)   Exercise Vital Sign    Days of Exercise per Week: 0 days    Minutes of Exercise per Session: 0 min  Stress: Stress Concern Present (07/26/2017)   [place]    Feeling of Stress : Rather much  Social Connections: Moderately Integrated  (07/26/2017)   Social Connection and Isolation Panel [NHANES]    Frequency of Communication with Friends and Family: Once a week    Frequency of Social Gatherings with Friends and Family: Three times a week    Attends Religious Services: 1 to 4 times per year    Active Member of Clubs or Organizations: Yes    Attends Archivist Meetings: 1 to 4 times per year    Marital Status: Widowed  Intimate Partner Violence: Not At Risk (02/09/2022)   Humiliation, Afraid, Rape, and Kick questionnaire    Fear of Current or Ex-Partner:  No    Emotionally Abused: No    Physically Abused: No    Sexually Abused: No                                                                                                 Objective:  Physical Exam: BP 136/84 (BP Location: Right Arm, Patient Position: Sitting, Cuff Size: Large)   Pulse 77   Temp 97.8 F (36.6 C) (Temporal)   Wt 295 lb (133.8 kg)   SpO2 94%   BMI 46.20 kg/m    General:  Awake and conversant.  Right head leaning over on a pillow because she feels dizzy, but able to lift head up Eyes: Normal conjunctiva, anicteric.  PERRLA, EOMI, no nystagmus ENT: Hearing grossly intact. No nasal discharge.  Right otic canal normal, normal right tympanic membrane, left ear canal with mild cerumen burden, mild effusion of left mid membrane Neck: Neck is supple. No masses or thyromegaly.  Respiratory: Respirations are non-labored.  CT B ABD: Nontender nondistended Skin: Warm. No rashes or ulcers.  Psych: Alert and oriented. Cooperative, Appropriate mood and affect, Normal judgment.  CV: No cyanosis or JVD, RRR, no MRG MSK: Normal ambulation. No clubbing  Neuro: Sensation and CN II-XII grossly normal.        Alesia Banda, MD, MS

## 2022-02-23 NOTE — Telephone Encounter (Signed)
Caller Name: [facility]) Call back phone #: [phone]  Reason for Call: Alprazolam was prescribed  for pt and flagged at the pharmacy. She is already taking a percocet and the combination of both medications can cause an increase in depression. Please review and give the pharmacy a call back, they can not fill until confirmation

## 2022-02-23 NOTE — Patient Instructions (Signed)
Your vertigo is likely from fluid in the ear. Take xanax to help improve dizziness. For the fluid, take prednisone. If no improvement in 1-2 days, you can take Augmentin for possible ear infection. Follow up if no improvement.

## 2022-02-23 NOTE — Telephone Encounter (Signed)
Advised [name] of annotation below and she verbalized understanding.

## 2022-02-27 ENCOUNTER — Inpatient Hospital Stay: Payer: HMO | Admitting: Family Medicine

## 2022-02-27 ENCOUNTER — Other Ambulatory Visit: Payer: Self-pay | Admitting: Family Medicine

## 2022-02-27 ENCOUNTER — Ambulatory Visit: Payer: PPO | Admitting: Podiatry

## 2022-02-27 ENCOUNTER — Other Ambulatory Visit: Payer: Self-pay | Admitting: Hematology

## 2022-02-27 ENCOUNTER — Other Ambulatory Visit: Payer: Self-pay | Admitting: Internal Medicine

## 2022-02-27 DIAGNOSIS — E1140 Type 2 diabetes mellitus with diabetic neuropathy, unspecified: Secondary | ICD-10-CM

## 2022-03-08 ENCOUNTER — Telehealth: Payer: Self-pay | Admitting: Family Medicine

## 2022-03-08 DIAGNOSIS — E1122 Type 2 diabetes mellitus with diabetic chronic kidney disease: Secondary | ICD-10-CM | POA: Diagnosis not present

## 2022-03-08 DIAGNOSIS — I130 Hypertensive heart and chronic kidney disease with heart failure and stage 1 through stage 4 chronic kidney disease, or unspecified chronic kidney disease: Secondary | ICD-10-CM | POA: Diagnosis not present

## 2022-03-08 DIAGNOSIS — Z794 Long term (current) use of insulin: Secondary | ICD-10-CM | POA: Diagnosis not present

## 2022-03-08 DIAGNOSIS — G894 Chronic pain syndrome: Secondary | ICD-10-CM | POA: Diagnosis not present

## 2022-03-08 DIAGNOSIS — J449 Chronic obstructive pulmonary disease, unspecified: Secondary | ICD-10-CM | POA: Diagnosis not present

## 2022-03-08 DIAGNOSIS — E1140 Type 2 diabetes mellitus with diabetic neuropathy, unspecified: Secondary | ICD-10-CM | POA: Diagnosis not present

## 2022-03-08 DIAGNOSIS — I2510 Atherosclerotic heart disease of native coronary artery without angina pectoris: Secondary | ICD-10-CM | POA: Diagnosis not present

## 2022-03-08 DIAGNOSIS — F0390 Unspecified dementia without behavioral disturbance: Secondary | ICD-10-CM | POA: Diagnosis not present

## 2022-03-08 DIAGNOSIS — I5033 Acute on chronic diastolic (congestive) heart failure: Secondary | ICD-10-CM | POA: Diagnosis not present

## 2022-03-08 DIAGNOSIS — N1831 Chronic kidney disease, stage 3a: Secondary | ICD-10-CM | POA: Diagnosis not present

## 2022-03-08 NOTE — Telephone Encounter (Signed)
[name] from [facility] is wanting Dr. Grandville Silos to know pt has a normal oxygen level, bp and temperature. She is though very tired a lot of the time, [name] has noticed when she is with her. If you have any further questions for [name] please call at [phone]  Pt is also wanting a refill on her nystatin cream (MYCOSTATIN) [[id number]], I let [name] know Dr Grandville Silos did not prescribe this last time.

## 2022-03-09 NOTE — Telephone Encounter (Signed)
Spoke with patient's daughter and advised her that there are 3 refills remaining at the pharmacy of nystatin cream. She verbalized understanding. She also stated that [place] has no energy and is fatigued. Please advise

## 2022-03-09 NOTE — Telephone Encounter (Signed)
Left patient a detailed voice message regarding annotation below.  

## 2022-03-13 ENCOUNTER — Telehealth: Payer: Self-pay | Admitting: Family Medicine

## 2022-03-13 NOTE — Telephone Encounter (Signed)
Pt and her daughter called in needing a cb concerning her head hurting and having a brownish discharge from her vagina. I offered an appointment, she declined. She wants someone to call her back to figure out what she can do. Please advise pt @ [phone]

## 2022-03-13 NOTE — Telephone Encounter (Signed)
[name], OT w/[facility] Ph# [phone]  Called back stating pt is declining to go to ER and wants appt with Dr. Grandville Silos. Pt still experiencing SOB but denies chest pain at this time. Pt O2 sat 94% and HR 67 per Erica. I advised per Denman George pt needs to go to ED due to symptoms per Dr. Grandville Silos. [name] did reiterate to pt and pt states that she did not want to go and for me to send message to provider. Pt said that last time she had to wait too long at the hospital.  Pt was advised we are unable to treat at our facility and would likely have to call 911 for ambulance transport. Pt still denies ED.   Erica requesting follow up call to patient or [name], pts daughter. [name] (DAUGHTER) [phone]

## 2022-03-13 NOTE — Telephone Encounter (Signed)
[name] from [facility] was transferred to me regarding patient having sharp chest pains and sob. I advised patient that she should go to the ED due to her symptoms for treatment. She verbalized understanding.

## 2022-03-14 ENCOUNTER — Ambulatory Visit (INDEPENDENT_AMBULATORY_CARE_PROVIDER_SITE_OTHER): Payer: HMO | Admitting: Family Medicine

## 2022-03-14 ENCOUNTER — Encounter (HOSPITAL_COMMUNITY): Payer: Self-pay

## 2022-03-14 ENCOUNTER — Encounter: Payer: Self-pay | Admitting: Family Medicine

## 2022-03-14 ENCOUNTER — Encounter: Payer: Self-pay | Admitting: Hematology

## 2022-03-14 VITALS — BP 132/82 | HR 77

## 2022-03-14 DIAGNOSIS — R110 Nausea: Secondary | ICD-10-CM

## 2022-03-14 DIAGNOSIS — R600 Localized edema: Secondary | ICD-10-CM | POA: Diagnosis not present

## 2022-03-14 DIAGNOSIS — R42 Dizziness and giddiness: Secondary | ICD-10-CM

## 2022-03-14 DIAGNOSIS — H9203 Otalgia, bilateral: Secondary | ICD-10-CM | POA: Diagnosis not present

## 2022-03-14 MED ORDER — ONDANSETRON HCL 4 MG PO TABS: 4.0000 mg | ORAL_TABLET | Freq: Three times a day (TID) | ORAL | 0 refills | Status: DC | PRN

## 2022-03-14 NOTE — Patient Instructions (Signed)
For leg swelling, we are ordering an ultrasound to look for a blood clot. The office will call to schedule. IF you get severe chest pain or shortness of breath, go to ED.   For ear pain and dizziness, may try zofran for nausea. We are checking blood work. We are referring to ENT. If you get severe dizziness, trouble speaking, trouble swallowing, facial or body weakness, go to ED.

## 2022-03-14 NOTE — Progress Notes (Signed)
Assessment/Plan:   Problem List Items Addressed This Visit   None Visit Diagnoses     Otalgia of both ears    -  Primary   Relevant Orders   Ambulatory referral to ENT   Comp Met (CMET)   CBC w/Diff   Vertigo       Relevant Medications   ondansetron (ZOFRAN) 4 MG tablet   Other Relevant Orders   Ambulatory referral to ENT   Comp Met (CMET)   CBC w/Diff   Nausea       Relevant Medications   ondansetron (ZOFRAN) 4 MG tablet   Other Relevant Orders   Comp Met (CMET)   CBC w/Diff   Leg edema, right       Relevant Orders   VAS [place] LOWER EXTREMITY VENOUS (DVT)   Comp Met (CMET)   CBC w/Diff      Ongoing vertigo and otalgia, status posttreatment for acute otitis media of the left.  Symptoms intermittently refractory to meclizine and alprazolam.  Normal neuro exam and no signs of neurologic deficit or signs of CVA Concern for ongoing vestibular inflammation status post recent infection on top of history of presumed BPPV We will check labs, did discuss inability to fully elucidate all intracranial abnormalities including possible stroke and likely need to obtain further advanced imaging, patient did not want to pursue advanced head imaging Refer to ENT Zofran for nausea, can also use meclizine and/or Ativan as needed Return and ED precautions discussed   Right lower extremity swelling, unilateral, left leg appears normal Discussed with patient that this could potentially be a DVT which could be lethal, advised patient to go to emergency department, patient is not interested in going to ED, Need to rule out DVT, Dopplers ordered, advised patient go to emergency department if developing chest pain or shortness of breath   Subjective:  HPI:  [name] is a 87 y.o. female who has Hyperlipidemia; Class 3 obesity ([place]); Microcytic anemia; TENSION HEADACHE; Essential hypertension; Chronic diastolic congestive heart failure ([place]); Long term current use of anticoagulant therapy;  Insomnia; Estrogen deficiency; COPD with chronic bronchitis; CN (constipation); Gout; Acute kidney injury superimposed on chronic kidney disease ([place]); Vitamin D deficiency; Endometrial polyp; GERD with esophagitis; Dementia ([place]); Type 2 diabetes mellitus with hyperlipidemia ([place]); Moderate nonproliferative diabetic retinopathy of both eyes without macular edema associated with type 2 diabetes mellitus ([place]); Chronic radicular lumbar pain; Lumbar spondylosis; Spinal stenosis, lumbar region, with neurogenic claudication; Bilateral primary osteoarthritis of knee; Iron deficiency anemia; Right elbow pain; Chronic pain of right hand; Breast cancer ([place]); Anxiety; Acute on chronic diastolic (congestive) heart failure ([place]); AKI (acute kidney injury) ([place]); Severe episode of recurrent major depressive disorder, without psychotic features ([place]); Pneumobilia; Dermatosis papulosa nigra; Positive D dimer; CKD (chronic kidney disease), stage IV ([place]); Type 2 diabetes mellitus with stage 4 chronic kidney disease, with long-term current use of insulin ([place]); Type 2 diabetes mellitus with diabetic neuropathy, with long-term current use of insulin ([place]); and Type 2 diabetes mellitus with hyperglycemia, with long-term current use of insulin ([place]) on their problem list..   She  has a past medical history of Abdominal pain (01/26/2012), Acute kidney injury superimposed on chronic kidney disease ([place]) (12/18/2014), Allergy, Anemia, unspecified, Anxiety, Anxiety associated with depression (06/08/2017), Arthritis, Breast cancer ([place]), Breast cancer screening (02/19/2014), Breast mass (10/27/2014), Chronic diastolic CHF (congestive heart failure) ([place]), Chronic pain syndrome (02/06/2015), CKD (chronic kidney disease), stage III ([place]), Coronary artery disease, Debility (08/01/2012), Dementia ([place]), Depression,  Diabetes mellitus, DOE (dyspnea on exertion) (06/24/2014), Dysphagia, unspecified(787.20) (07/31/2012), Fungal dermatitis (11/13/2007),  GERD (gastroesophageal reflux disease), Gout, unspecified, Headache, History of blood clots, History of deep venous thrombosis (01/27/2012), History of pulmonary embolus (PE) (04/22/2014), Hyperkalemia (07/26/2017), Hyperlipidemia, Hypertension, Insomnia, Joint pain, Joint swelling, Morbid obesity ([place]), Multiple benign nevi (11/15/2016), Myocardial infarction ([place]) (2004), Non-STEMI (non-ST elevated myocardial infarction) ([place]) (02/15/2020), Numbness, Obstructive sleep apnea, Osteoarthritis, Osteoarthritis, Osteoarthrosis, unspecified whether generalized or localized, lower leg, Pain in the chest (12/31/2013), Pain, chronic, Personal history of other diseases of the digestive system (12/03/2006), Polymyalgia rheumatica ([place]), Presence of stent in right coronary artery, Pulmonary emboli ([place]) (01/2012), Situational depression (06/04/2009), Syncope, vasovagal (07/04/2021), Urinary incontinence, Vaginitis and vulvovaginitis (06/23/2016), Vertigo, and Wears dentures..   She presents with chief complaint of Ear Pain (Left and right ear pain and headache.) .  Patient is with daughter [name], who helps take care of patient  Ear Pain: Patient presents with bilateral ear pain.  Symptoms include bilateral ear pain and dizziness when turning head to right and nose burning . Symptoms began 1 month ago.  Patient was evaluated on 02/13/2022 eval have left ear effusion.  She was treated with Augmentin and prednisone.  She reports that she did have improvement in symptoms but she had return of symptoms about 1 week ago.  Patient endorses chronic shortness of breath, but not worse than baseline.  Patient denies chest pain.  Patient denies chills, dyspnea, bilateral ear congestion, facial pain dysarthria, confusion, difficulty swallowing, facial weakness or paralysis, extremity weakness, fever, productive cough, sore throat, sweats, and wheezing .  Patient has a history of vertigo, has tried meclizine and Ativan.  Reports that  these help with the dizziness.  But also reports that she has intermittent nausea.  Patient has not had any head trauma.  Right lower extremity swelling.  Patient developed right lower extremity swelling yesterday.  Patient has history of chronic bilateral lower extremity swelling.  She usually treats these with torsemide and compression stockings.  There is no pain in that extremity.   Past Surgical History:  Procedure Laterality Date   ABDOMINAL HYSTERECTOMY     partial   APPENDECTOMY     blood clots/legs and lungs  2013   BREAST BIOPSY Left 07/22/2014   BREAST BIOPSY Left 02/10/2013   BREAST LUMPECTOMY Left 11/05/2014   BREAST LUMPECTOMY WITH RADIOACTIVE SEED LOCALIZATION Left 11/05/2014   Procedure: LEFT BREAST LUMPECTOMY WITH RADIOACTIVE SEED LOCALIZATION;  Surgeon: [name], MD;  Location: [facility];  Service: General;  Laterality: Left;   CARDIAC CATHETERIZATION     COLONOSCOPY     CORONARY ANGIOPLASTY  2   CORONARY STENT INTERVENTION N/A 02/17/2020   Procedure: CORONARY STENT INTERVENTION;  Surgeon: Leonie Man, MD;  Location: [facility];  Service: Cardiovascular;  Laterality: N/A;   ESOPHAGOGASTRODUODENOSCOPY (EGD) WITH PROPOFOL N/A 11/07/2016   Procedure: ESOPHAGOGASTRODUODENOSCOPY (EGD) WITH PROPOFOL;  Surgeon: Gatha Mayer, MD;  Location: [facility];  Service: Endoscopy;  Laterality: N/A;   EXCISION OF SKIN TAG Right 11/05/2014   Procedure: EXCISION OF RIGHT EYELID SKIN TAG;  Surgeon: [name], MD;  Location: [facility];  Service: General;  Laterality: Right;   EYE SURGERY Bilateral    cataract    GASTRIC BYPASS  1977    reversed in 1979, [place] CATH AND CORONARY ANGIOGRAPHY N/A 02/17/2020   Procedure: LEFT HEART CATH AND CORONARY ANGIOGRAPHY;  Surgeon: Leonie Man, MD;  Location: [facility];  Service: Cardiovascular;  Laterality: N/A;   LEFT HEART CATHETERIZATION WITH CORONARY ANGIOGRAM N/A 06/29/2014   Procedure: LEFT  HEART CATHETERIZATION WITH CORONARY ANGIOGRAM;  Surgeon: Troy Sine, MD;  Location: [facility];  Service: Cardiovascular;  Laterality: N/A;   MEMBRANE PEEL Right 10/23/2018   Procedure: MEMBRANE PEEL;  Surgeon: Hurman Horn, MD;  Location: [facility];  Service: Ophthalmology;  Laterality: Right;   MI with stent placement  2004   PARS PLANA VITRECTOMY Right 10/23/2018   Procedure: PARS PLANA VITRECTOMY WITH 25 GAUGE;  Surgeon: Hurman Horn, MD;  Location: [facility];  Service: Ophthalmology;  Laterality: Right;    Outpatient Medications Prior to Visit  Medication Sig Dispense Refill   albuterol (PROAIR HFA) 108 (90 Base) MCG/ACT inhaler Inhale 1-2 puffs into the lungs every 6 (six) hours as needed for wheezing or shortness of breath. 6.7 g 1   ALPRAZolam (XANAX) 0.5 MG tablet Take 1 tablet (0.5 mg total) by mouth 3 (three) times daily as needed (vertigo). 20 tablet 0   anastrozole (ARIMIDEX) 1 MG tablet TAKE 1 TABLET BY MOUTH EVERY DAY 28 tablet 1   ASPIRIN LOW DOSE 81 MG EC tablet TAKE 1 TABLET BY MOUTH EVERY DAY 30 tablet 11   atorvastatin (LIPITOR) 80 MG tablet TAKE 1 TABLET BY MOUTH EVERY DAY 90 tablet 3   budesonide-formoterol (SYMBICORT) 160-4.5 MCG/ACT inhaler Inhale 2 puffs into the lungs 2 (two) times daily. 1 each 3   carvedilol (COREG) 12.5 MG tablet TAKE 1 TABLET BY MOUTH 2 TIMES DAILY 180 tablet 1   cetirizine (ZYRTEC) 10 MG tablet Take 10 mg by mouth daily as needed for allergies or rhinitis.     clopidogrel (PLAVIX) 75 MG tablet Take 1 tablet (75 mg total) by mouth daily. 90 tablet 3   Continuous Blood Gluc Sensor (FREESTYLE LIBRE 14 DAY SENSOR) MISC PLACE 1 DEVICE ON THE SKIN AS DIRECTED EVERY 14 DAYS 6 each 2   diclofenac Sodium (VOLTAREN) 1 % GEL Apply 2 g topically 4 (four) times daily. 150 g 2   febuxostat (ULORIC) 40 MG tablet TAKE 1 TABLET BY MOUTH EVERY DAY 30 tablet 5   fluticasone (FLONASE) 50 MCG/ACT nasal spray Place 1 spray into both nostrils in the morning and at  bedtime. 54.6 mL 3   folic acid (FOLVITE) 1 MG tablet TAKE 1 TABLET BY MOUTH EVERY DAY 90 tablet 1   guaifenesin (ROBITUSSIN) 100 MG/5ML syrup Take 200 mg by mouth 3 (three) times daily as needed for cough.     insulin aspart (NOVOLOG FLEXPEN) 100 UNIT/ML FlexPen Inject 14 Units into the skin 3 (three) times daily before meals.     insulin glargine, 2 Unit Dial, (TOUJEO MAX SOLOSTAR) 300 UNIT/ML Solostar Pen Inject 42 Units into the skin daily. Patient assistance program provides 15 mL 6   linaclotide (LINZESS) 145 MCG CAPS capsule Take 1 capsule (145 mcg total) by mouth daily before breakfast. 30 capsule 2   meclizine (ANTIVERT) 25 MG tablet TAKE 1 TABLET BY MOUTH THREE TIMES DAILY AS NEEDED FOR DIZZINESS 30 tablet 11   memantine (NAMENDA) 5 MG tablet TAKE 2 TABLETS BY MOUTH EVERY DAY and TAKE 1 TABLET BY MOUTH EVERY EVENING 270 tablet 5   nitroGLYCERIN (NITROSTAT) 0.4 MG SL tablet Take one tablet under the tongue every 5 minutes as needed for chest pain 50 tablet 0   nystatin cream (MYCOSTATIN) Apply 1 Application topically 2 (two) times daily. 30 g 3  OVER THE COUNTER MEDICATION Take 1 capsule by mouth every other day. Over the counter stool softener - unknown type     pantoprazole (PROTONIX) 20 MG tablet TAKE 1 TABLET BY MOUTH EVERY DAY 30 tablet 5   sodium polystyrene (SPS) 15 GM/60ML suspension take 70ms by MOUTH ONCE WEEKLY. TO keep potassium down 240 mL 2   torsemide (DEMADEX) 20 MG tablet TAKE 1 TABLET BY MOUTH EVERY DAY Take an extra dose for weight gain, 3lb in 1 day or 5lbs in 1 week 30 tablet 0   traZODone (DESYREL) 50 MG tablet Take 0.5 tablets (25 mg total) by mouth at bedtime. 45 tablet 3   Vitamin D, Ergocalciferol, (DRISDOL) 1.25 MG (50000 UNIT) CAPS capsule TAKE 1 CAPSULE BY MOUTH every 7 days 4 capsule 1   escitalopram (LEXAPRO) 20 MG tablet Take 0.5 tablets (10 mg total) by mouth daily for 7 days. Then stop 3.5 tablet 0   No facility-administered medications prior to visit.     Family History  Problem Relation Age of Onset   Breast cancer Mother 655  Heart disease Mother    Throat cancer Father    Hypertension Father    Arthritis Father    Diabetes Father    Arthritis Sister    Obesity Sister    Diabetes Sister    Heart disease Cousin    Colon cancer Neg Hx    Stomach cancer Neg Hx    Esophageal cancer Neg Hx     Social History   Socioeconomic History   Marital status: Widowed    Spouse name: Not on file   Number of children: 3   Years of education: Not on file   Highest education level: High school graduate  Occupational History   Occupation: retired  Tobacco Use   Smoking status: Never    Passive exposure: Past   Smokeless tobacco: Never   Tobacco comments:    Both parents smoked, patient was exposed/ "Raised up in smoke, my whole life."  Vaping Use   Vaping Use: Never used  Substance and Sexual Activity   Alcohol use: No    Alcohol/week: 0.0 standard drinks of alcohol   Drug use: No   Sexual activity: Not Currently  Other Topics Concern   Not on file  Social History Narrative   Not on file   Social Determinants of Health   Financial Resource Strain: Low Risk  (10/14/2021)   Overall Financial Resource Strain (CARDIA)    Difficulty of Paying Living Expenses: Not very hard  Food Insecurity: No Food Insecurity (02/09/2022)   Hunger Vital Sign    Worried About Running Out of Food in the Last Year: Never true    Ran Out of Food in the Last Year: Never true  Transportation Needs: No Transportation Needs (02/09/2022)   PRAPARE - THydrologist(Medical): No    Lack of Transportation (Non-Medical): No  Physical Activity: Inactive (07/26/2017)   Exercise Vital Sign    Days of Exercise per Week: 0 days    Minutes of Exercise per Session: 0 min  Stress: Stress Concern Present (07/26/2017)   [place]   Feeling of Stress : Rather much  Social  Connections: Moderately Integrated (07/26/2017)   Social Connection and Isolation Panel [NHANES]    Frequency of Communication with Friends and Family: Once a week    Frequency of Social Gatherings with Friends and Family: Three times  a week    Attends Religious Services: 1 to 4 times per year    Active Member of Clubs or Organizations: Yes    Attends Archivist Meetings: 1 to 4 times per year    Marital Status: Widowed  Intimate Partner Violence: Not At Risk (02/09/2022)   Humiliation, Afraid, Rape, and Kick questionnaire    Fear of Current or Ex-Partner: No    Emotionally Abused: No    Physically Abused: No    Sexually Abused: No                                                                                                 Objective:  Physical Exam: BP 132/82 (BP Location: Left Arm, Patient Position: Sitting, Cuff Size: Large)   Pulse 77   SpO2 97%    General: No acute distress. Awake and conversant.  Patient is in wheelchair Eyes: Normal conjunctiva, anicteric.  PERRLA, EOMI, no nystagmus Head: Normocephalic atraumatic ENT: Hearing grossly intact. No nasal discharge.  Bilateral tympanic membranes pearly, otic canals clear and without tenderness Neck: Neck is supple. No masses or thyromegaly.  Respiratory: Respirations are non-labored. No auditory wheezing.  CTA B Skin: Warm. No rashes or ulcers.  Psych: Alert and oriented. Cooperative, Appropriate mood and affect, Normal judgment.  CV: No cyanosis or JVD, RRR, no MRG MSK: Right lower extremity pitting edema 1+ up to mid shin, calf nontender and not red, bilateral upper extremity strength grossly intact and symmetric Neuro: Alert and oriented x4, cranial nerves II through XII grossly intact, no facial symmetry, no tremor       Alesia Banda, MD, MS

## 2022-03-15 ENCOUNTER — Other Ambulatory Visit (HOSPITAL_COMMUNITY): Payer: Self-pay

## 2022-03-15 ENCOUNTER — Telehealth: Payer: Self-pay | Admitting: Family Medicine

## 2022-03-15 ENCOUNTER — Telehealth: Payer: Self-pay

## 2022-03-15 ENCOUNTER — Ambulatory Visit (INDEPENDENT_AMBULATORY_CARE_PROVIDER_SITE_OTHER): Payer: HMO | Admitting: Podiatry

## 2022-03-15 DIAGNOSIS — E1169 Type 2 diabetes mellitus with other specified complication: Secondary | ICD-10-CM

## 2022-03-15 DIAGNOSIS — N183 Chronic kidney disease, stage 3 unspecified: Secondary | ICD-10-CM

## 2022-03-15 DIAGNOSIS — B3731 Acute candidiasis of vulva and vagina: Secondary | ICD-10-CM

## 2022-03-15 DIAGNOSIS — B351 Tinea unguium: Secondary | ICD-10-CM

## 2022-03-15 DIAGNOSIS — M79609 Pain in unspecified limb: Secondary | ICD-10-CM

## 2022-03-15 DIAGNOSIS — M21969 Unspecified acquired deformity of unspecified lower leg: Secondary | ICD-10-CM

## 2022-03-15 LAB — CBC WITH DIFFERENTIAL/PLATELET
Basophils Absolute: 0.1 10*3/uL (ref 0.0–0.1)
Basophils Relative: 0.5 % (ref 0.0–3.0)
Eosinophils Absolute: 0.4 10*3/uL (ref 0.0–0.7)
Eosinophils Relative: 3.4 % (ref 0.0–5.0)
HCT: 28.9 % — ABNORMAL LOW (ref 36.0–46.0)
Hemoglobin: 9.4 g/dL — ABNORMAL LOW (ref 12.0–15.0)
Lymphocytes Relative: 20.1 % (ref 12.0–46.0)
Lymphs Abs: 2.1 10*3/uL (ref 0.7–4.0)
MCHC: 32.4 g/dL (ref 30.0–36.0)
MCV: 91 fl (ref 78.0–100.0)
Monocytes Absolute: 0.6 10*3/uL (ref 0.1–1.0)
Monocytes Relative: 5.4 % (ref 3.0–12.0)
Neutro Abs: 7.4 10*3/uL (ref 1.4–7.7)
Neutrophils Relative %: 70.6 % (ref 43.0–77.0)
Platelets: 183 10*3/uL (ref 150.0–400.0)
RBC: 3.17 Mil/uL — ABNORMAL LOW (ref 3.87–5.11)
RDW: 14 % (ref 11.5–15.5)
WBC: 10.5 10*3/uL (ref 4.0–10.5)

## 2022-03-15 LAB — COMPREHENSIVE METABOLIC PANEL
ALT: 7 U/L (ref 0–35)
AST: 10 U/L (ref 0–37)
Albumin: 3.5 g/dL (ref 3.5–5.2)
Alkaline Phosphatase: 74 U/L (ref 39–117)
BUN: 60 mg/dL — ABNORMAL HIGH (ref 6–23)
CO2: 26 mEq/L (ref 19–32)
Calcium: 8.8 mg/dL (ref 8.4–10.5)
Chloride: 103 mEq/L (ref 96–112)
Creatinine, Ser: 2.19 mg/dL — ABNORMAL HIGH (ref 0.40–1.20)
GFR: 20.07 mL/min — ABNORMAL LOW (ref >60.00)
Glucose, Bld: 329 mg/dL — ABNORMAL HIGH (ref 70–99)
Potassium: 4.7 mEq/L (ref 3.5–5.1)
Sodium: 139 mEq/L (ref 135–145)
Total Bilirubin: 0.4 mg/dL (ref 0.2–1.2)
Total Protein: 7.4 g/dL (ref 6.0–8.3)

## 2022-03-15 MED ORDER — FLUCONAZOLE 150 MG PO TABS: 150.0000 mg | ORAL_TABLET | Freq: Once | ORAL | 0 refills | Status: AC

## 2022-03-15 NOTE — Telephone Encounter (Signed)
Caller Name: [name], daughter  Call back phone #: [phone]  Reason for Call: pt has yeast infection from the series of antibiotics she is on. [name] asking if Dr. Grandville Silos (or covering provider) can send in something. Advised her Dr. Grandville Silos is out today and may not respond until tomorrow 11/9.  [address], [address], [place] [address] Phone: [phone]  Fax: [phone]

## 2022-03-15 NOTE — Telephone Encounter (Signed)
Pharmacy Patient Advocate Encounter  Received notification from [employer] that the request for prior authorization for Ondansetron HCl '4MG'$  tablets has been denied due to drug not prescribed for a medically accepted indication. Drug is covered for diagnosis of chemotherapy or radiation induced nausea/vomiting or prophylaxis of post-surgical nausea/vomiting, before induction of anesthesia or shortly after surgery.     Key: [id number]  Denial letter attached to chart

## 2022-03-15 NOTE — Telephone Encounter (Signed)
Pharmacy Patient Advocate Encounter   Received notification from [facility] that prior authorization for Ondansetron HCl '4MG'$  tablets is required/requested.  PA submitted on 03/15/2022 to [employer] via CoverMyMeds/Fax Key [id number]  Status is pending

## 2022-03-15 NOTE — Telephone Encounter (Deleted)
Pharmacy Patient Advocate Encounter   Received notification from [facility] that prior authorization for Ondansetron HCl '4MG'$  tablets is required/requested.   PA submitted on 03/15/2022 to [employer] via [place]  Status is pending

## 2022-03-15 NOTE — Progress Notes (Signed)
This patient returns to my office for at risk foot care.  This patient requires this care by a professional since this patient will be at risk due to having diabetes and CKD. She presents to the office with her daughter in a wheelchair.  This patient is unable to cut nails herself since the patient cannot reach her nails.These nails are painful walking and wearing shoes.  This patient presents for at risk foot care today.  General Appearance  Alert, conversant and in no acute stress.  Vascular  Dorsalis pedis and posterior tibial  pulses are palpable  bilaterally.  Capillary return is within normal limits  bilaterally. Temperature is within normal limits  bilaterally.  Neurologic  Senn-Weinstein monofilament wire test within normal limits  bilaterally. Muscle power within normal limits bilaterally.  Nails Thick disfigured discolored nails with subungual debris  from hallux to fifth toes bilaterally. No evidence of bacterial infection or drainage bilaterally.  Orthopedic  No limitations of motion  feet .  No crepitus or effusions noted.  No bony pathology or digital deformities noted. Deviation of toes 2-5 right foot at the MPJ.  Skin  normotropic skin with no porokeratosis noted bilaterally.  No signs of infections or ulcers noted.     Onychomycosis  Pain in right toes  Pain in left toes  Consent was obtained for treatment procedures.   Mechanical debridement of nails 1-5  bilaterally performed with a nail nipper.  Filed with dremel without incident.    Return office visit  6  months                   Told patient to return for periodic foot care and evaluation due to potential at risk complications. Patient daughter said I did a poor job and I would never be able to do her nails.  I told her I did my best with her mother in wheelchair and I was wearing my back brace.    She told me that was no excuse.   [name] DPM

## 2022-03-15 NOTE — Telephone Encounter (Signed)
error 

## 2022-03-16 ENCOUNTER — Emergency Department (HOSPITAL_BASED_OUTPATIENT_CLINIC_OR_DEPARTMENT_OTHER)
Admission: EM | Admit: 2022-03-16 | Discharge: 2022-03-16 | Disposition: A | Discharge status: Home / Self Care | Payer: HMO | Attending: Emergency Medicine | Admitting: Emergency Medicine

## 2022-03-16 ENCOUNTER — Ambulatory Visit (HOSPITAL_BASED_OUTPATIENT_CLINIC_OR_DEPARTMENT_OTHER)
Admission: RE | Admit: 2022-03-16 | Discharge: 2022-03-16 | Disposition: A | Discharge status: Home / Self Care | Payer: HMO | Source: Ambulatory Visit | Attending: Family Medicine | Admitting: Family Medicine

## 2022-03-16 ENCOUNTER — Telehealth: Payer: Self-pay

## 2022-03-16 ENCOUNTER — Encounter: Payer: Self-pay | Admitting: Hematology

## 2022-03-16 ENCOUNTER — Other Ambulatory Visit (HOSPITAL_COMMUNITY): Payer: Self-pay

## 2022-03-16 ENCOUNTER — Encounter (HOSPITAL_BASED_OUTPATIENT_CLINIC_OR_DEPARTMENT_OTHER): Payer: Self-pay | Admitting: Emergency Medicine

## 2022-03-16 ENCOUNTER — Other Ambulatory Visit: Payer: Self-pay

## 2022-03-16 ENCOUNTER — Other Ambulatory Visit (HOSPITAL_BASED_OUTPATIENT_CLINIC_OR_DEPARTMENT_OTHER): Payer: Self-pay

## 2022-03-16 ENCOUNTER — Encounter (HOSPITAL_COMMUNITY): Payer: Self-pay

## 2022-03-16 DIAGNOSIS — I82411 Acute embolism and thrombosis of right femoral vein: Secondary | ICD-10-CM

## 2022-03-16 DIAGNOSIS — N189 Chronic kidney disease, unspecified: Secondary | ICD-10-CM | POA: Insufficient documentation

## 2022-03-16 DIAGNOSIS — Z794 Long term (current) use of insulin: Secondary | ICD-10-CM | POA: Diagnosis not present

## 2022-03-16 DIAGNOSIS — R600 Localized edema: Secondary | ICD-10-CM

## 2022-03-16 DIAGNOSIS — E1122 Type 2 diabetes mellitus with diabetic chronic kidney disease: Secondary | ICD-10-CM | POA: Insufficient documentation

## 2022-03-16 DIAGNOSIS — D649 Anemia, unspecified: Secondary | ICD-10-CM | POA: Insufficient documentation

## 2022-03-16 DIAGNOSIS — I5030 Unspecified diastolic (congestive) heart failure: Secondary | ICD-10-CM | POA: Diagnosis not present

## 2022-03-16 MED ORDER — APIXABAN (ELIQUIS) VTE STARTER PACK (10MG AND 5MG)
ORAL_TABLET | ORAL | 0 refills | Status: DC
  Filled 2022-03-16: qty 74, 30d supply, fill #0

## 2022-03-16 MED ORDER — APIXABAN (ELIQUIS) EDUCATION KIT FOR DVT/PE PATIENTS: PACK | Freq: Once | Status: AC

## 2022-03-16 MED ORDER — APIXABAN 2.5 MG PO TABS
10.0000 mg | ORAL_TABLET | Freq: Once | ORAL | Status: AC
  Administered 2022-03-16: 10 mg via ORAL
  Filled 2022-03-16: qty 4

## 2022-03-16 NOTE — ED Provider Notes (Signed)
[facility] EMERGENCY DEPT Provider Note   CSN: [id number] Arrival date & time: 03/16/22  1417     History  Chief Complaint  Patient presents with   DVT    [name] is a 87 y.o. female.  HPI 87 year old female with multiple comorbidities which include remote history of DVT/PE, diastolic heart failure, chronic kidney disease, diabetes, etc. presents with right leg DVT.  Had a DVT ultrasound today from PCP due to 2-3 weeks of right leg swelling.  No significant pain except with palpation.  No new dyspnea or chest pain though she does have these chronically and slowly worsening.  DVT study was positive and PCP tried to get her to follow-up as an outpatient but family tells me that because the clinic was not able to see her today they came here instead.  Home Medications Prior to Admission medications   Medication Sig Start Date End Date Taking? Authorizing Provider  APIXABAN (ELIQUIS) VTE STARTER PACK (10MG AND 5MG) Take as directed on package: start with two-50m tablets twice daily for 7 days. On day 8, switch to one-522mtablet twice daily. 03/16/22  Yes GoSherwood GamblerMD  albuterol (PROAIR HFA) 108 (90 Base) MCG/ACT inhaler Inhale 1-2 puffs into the lungs every 6 (six) hours as needed for wheezing or shortness of breath. 12/19/18   EuLauree ChandlerNP  ALPRAZolam (XDuanne Moron0.5 MG tablet Take 1 tablet (0.5 mg total) by mouth 3 (three) times daily as needed (vertigo). 02/23/22   ThBonnita HollowMD  anastrozole (ARIMIDEX) 1 MG tablet TAKE 1 TABLET BY MOUTH EVERY DAY 02/27/22   KaBrunetta GeneraMD  atorvastatin (LIPITOR) 80 MG tablet TAKE 1 TABLET BY MOUTH EVERY DAY 01/11/22   ThBonnita HollowMD  budesonide-formoterol ([facility]-4.5 MCG/ACT inhaler Inhale 2 puffs into the lungs 2 (two) times daily. 01/26/21   EuLauree ChandlerNP  carvedilol (COREG) 12.5 MG tablet TAKE 1 TABLET BY MOUTH 2 TIMES DAILY 01/11/22   ThBonnita HollowMD  cetirizine (ZYRTEC) 10  MG tablet Take 10 mg by mouth daily as needed for allergies or rhinitis.    [provider]  clopidogrel (PLAVIX) 75 MG tablet Take 1 tablet (75 mg total) by mouth daily. 05/26/21   PeFreada BergeronMD  Continuous Blood Gluc Sensor (FREESTYLE LIBRE 14 DAY SENSOR) MISC PLACE 1 DEVICE ON THE SKIN AS DIRECTED EVERY 14 DAYS 02/27/22   Shamleffer, IbMelanie CrazierMD  diclofenac Sodium (VOLTAREN) 1 % GEL Apply 2 g topically 4 (four) times daily. 02/23/22   ThBonnita HollowMD  escitalopram (LEXAPRO) 20 MG tablet Take 0.5 tablets (10 mg total) by mouth daily for 7 days. Then stop 02/16/22 02/23/22  Reed, Tiffany L, DO  febuxostat (ULORIC) 40 MG tablet TAKE 1 TABLET BY MOUTH EVERY DAY 10/12/21   EuLauree ChandlerNP  fluticasone (FLONASE) 50 MCG/ACT nasal spray Place 1 spray into both nostrils in the morning and at bedtime. 12/17/20   Ngetich, Dinah C, NP  folic acid (FOLVITE) 1 MG tablet TAKE 1 TABLET BY MOUTH EVERY DAY 01/11/22   ThBonnita HollowMD  guaifenesin (ROBITUSSIN) 100 MG/5ML syrup Take 200 mg by mouth 3 (three) times daily as needed for cough.    [provider]  insulin aspart (NOVOLOG FLEXPEN) 100 UNIT/ML FlexPen Inject 14 Units into the skin 3 (three) times daily before meals.    [provider]  insulin glargine, 2 Unit Dial, (TOUJEO MAX SOLOSTAR) 300 UNIT/ML Solostar  Pen Inject 42 Units into the skin daily. Patient assistance program provides 05/24/21   Shamleffer, [name], MD  linaclotide [facility]) 145 MCG CAPS capsule Take 1 capsule (145 mcg total) by mouth daily before breakfast. 09/05/21   [name], NP  meclizine (ANTIVERT) 25 MG tablet TAKE 1 TABLET BY MOUTH THREE TIMES DAILY AS NEEDED FOR DIZZINESS 10/11/21   [name], NP  memantine (NAMENDA) 5 MG tablet TAKE 2 TABLETS BY MOUTH EVERY DAY and TAKE 1 TABLET BY MOUTH EVERY EVENING 09/26/21   [name], NP  nitroGLYCERIN (NITROSTAT) 0.4 MG SL tablet Take one tablet under the  tongue every 5 minutes as needed for chest pain 09/24/13   [name], NP  nystatin cream (MYCOSTATIN) Apply 1 Application topically 2 (two) times daily. 02/16/22   Reed, Tiffany L, DO  ondansetron (ZOFRAN) 4 MG tablet Take 1 tablet (4 mg total) by mouth every 8 (eight) hours as needed for nausea or vomiting. 03/14/22   Bonnita Hollow, MD  OVER THE COUNTER MEDICATION Take 1 capsule by mouth every other day. Over the counter stool softener - unknown type    [provider]  oxyCODONE-acetaminophen (PERCOCET) 7.5-325 MG tablet Take 1 tablet by mouth every 12 (twelve) hours. 03/01/22   [provider]  pantoprazole (PROTONIX) 20 MG tablet TAKE 1 TABLET BY MOUTH EVERY DAY 12/26/21   Bonnita Hollow, MD  sodium polystyrene (SPS) 15 GM/60ML suspension take 51ms by MOUTH ONCE WEEKLY. TO keep potassium down 12/06/21   TBonnita Hollow MD  torsemide (DEMADEX) 20 MG tablet TAKE 1 TABLET BY MOUTH EVERY DAY Take an extra dose for weight gain, 3lb in 1 day or 5lbs in 1 week 02/27/22   TBonnita Hollow MD  traZODone (DESYREL) 50 MG tablet Take 0.5 tablets (25 mg total) by mouth at bedtime. 02/16/22   Reed, Tiffany L, DO  Vitamin D, Ergocalciferol, (DRISDOL) 1.25 MG (50000 UNIT) CAPS capsule TAKE 1 CAPSULE BY MOUTH every 7 days 02/09/22   KBrunetta Genera MD      Allergies    Sulfonamide derivatives, Lokelma [sodium zirconium cyclosilicate], Aricept [donepezil hcl], and Tramadol    Review of Systems   Review of Systems  Cardiovascular:  Positive for leg swelling. Negative for chest pain.  Neurological:  Negative for numbness.    Physical Exam Updated Vital Signs BP 136/83 (BP Location: Left Arm)   Pulse 62   Temp 98.5 F (36.9 C) (Oral)   Resp 15   Ht _0  (1.702 m)   Wt 133.8 kg   SpO2 95%   BMI 46.20 kg/m  Physical Exam Vitals and nursing note reviewed.  Constitutional:      General: She is not in acute distress.    Appearance: She is well-developed. She is  obese. She is not ill-appearing or diaphoretic.  HENT:     Head: Normocephalic and atraumatic.  Cardiovascular:     Rate and Rhythm: Normal rate and regular rhythm.     Pulses:          Dorsalis pedis pulses are 2+ on the right side.  Pulmonary:     Effort: Pulmonary effort is normal.  Abdominal:     General: There is no distension.  Musculoskeletal:     Comments: Calf and foot on right are mildly and diffusely swollen. No skin color change. Mild calf tenderness. No significant thigh tenderness.  Skin:    General: Skin is warm and dry.  Neurological:     Mental Status: She is alert.     ED Results / Procedures / Treatments   Labs (all labs ordered are listed, but only abnormal results are displayed) Labs Reviewed - No data to display  EKG None  Radiology VAS [place] LOWER EXTREMITY VENOUS (DVT)  Result Date: 03/16/2022  Lower Venous DVT Study Patient Name:  DAYLIN EADS  Date of Exam:   03/16/2022 Medical Rec #: [id number]        Accession #:    [id number] Date of Birth: 1934-06-18        Patient Gender: F Patient Age:   58 years Exam Location:  [facility] Procedure:      VAS [place] LOWER EXTREMITY VENOUS (DVT) Referring Phys: Josephine Igo --------------------------------------------------------------------------------  Indications: Right calf swelling x 3 weeks. Patient noticed increasing SOB over the last 3 weeks. She denies chest pains.  Anticoagulation: Plavix and aspirin only. Comparison       Bilateral leg venous duplex done 03/03/2020 was negative for Study:           DVT. Performing Technologist: Salvadore Dom RVT, RDCS (AE), RDMS  Examination Guidelines: A complete evaluation includes B-mode imaging, spectral Doppler, color Doppler, and power Doppler as needed of all accessible portions of each vessel. Bilateral testing is considered an integral part of a complete examination. Limited examinations for reoccurring indications may be performed as noted. The reflux portion of the exam is  performed with the patient in reverse Trendelenburg.  +---------+---------------+---------+-----------+----------+--------------+ RIGHT    CompressibilityPhasicitySpontaneityPropertiesThrombus Aging +---------+---------------+---------+-----------+----------+--------------+ CFV      Full           Yes      Yes                                 +---------+---------------+---------+-----------+----------+--------------+ SFJ      Full           Yes      Yes                                 +---------+---------------+---------+-----------+----------+--------------+ FV Prox  Partial        Yes      No                                  +---------+---------------+---------+-----------+----------+--------------+ FV Mid   Partial        Yes      No                                  +---------+---------------+---------+-----------+----------+--------------+ FV DistalPartial        Yes      No                                  +---------+---------------+---------+-----------+----------+--------------+ PFV      Full                                                        +---------+---------------+---------+-----------+----------+--------------+ POP  Partial        Yes      No                                  +---------+---------------+---------+-----------+----------+--------------+ PTV      Full           Yes      Yes                                 +---------+---------------+---------+-----------+----------+--------------+ PERO     Full           Yes      Yes                                 +---------+---------------+---------+-----------+----------+--------------+ Gastroc  Full                                                        +---------+---------------+---------+-----------+----------+--------------+ GSV      Full           Yes      Yes                                  +---------+---------------+---------+-----------+----------+--------------+   +----+---------------+---------+-----------+----------+--------------+ LEFTCompressibilityPhasicitySpontaneityPropertiesThrombus Aging +----+---------------+---------+-----------+----------+--------------+ CFV Full           Yes      Yes                                 +----+---------------+---------+-----------+----------+--------------+    Findings reported to Asencion Partridge, RN at Dr. Ellison Carwin office, Lenise Herald, RN and Rebbeca Paul at 1:30 pm . Patient referred to ER.  Summary: RIGHT: - Findings consistent with acute deep vein thrombosis involving the right femoral vein, and right popliteal vein.  LEFT: - No evidence of common femoral vein obstruction.  *See table(s) above for measurements and observations.    Preliminary     Procedures Procedures    Medications Ordered in ED Medications  apixaban (ELIQUIS) tablet 10 mg (has no administration in time range)  apixaban (ELIQUIS) Education Kit for DVT/PE patients (has no administration in time range)    ED Course/ Medical Decision Making/ A&P                           Medical Decision Making Risk Prescription drug management.   Labs from couple days ago reviewed and no significant change in her chronic mild anemia as well as her chronic kidney disease.  After discussing with family multiple times, it seems like PE is probably less likely.  She has this chronic dyspnea that may be slowly worse but no new sudden dyspnea, no hypoxia and no chest pain currently.  I think PE is unlikely.  We discussed we could send her to [facility] for a [facility] scan given her kidney function.  While she is not a low risk patient in general, she has had this DVT for 2-3 weeks, is neurovascular intact, and I think oral anticoagulation  is warranted.  However we did discuss strict return precautions.  No significant bleeding history per patient or chart review.  We will give first dose  Eliquis now and discharged with prescription for the first month.        Final Clinical Impression(s) / ED Diagnoses Final diagnoses:  Acute deep vein thrombosis (DVT) of femoral vein of right lower extremity ([place])    Rx / DC Orders ED Discharge Orders          Ordered    APIXABAN (ELIQUIS) VTE STARTER PACK (10MG AND 5MG)        03/16/22 1602              Sherwood Gambler, MD 03/16/22 1607

## 2022-03-16 NOTE — Telephone Encounter (Signed)
Sonographer called to confirm that pt has a non-occlusive DVT from the groin down to the popliteal vein. Wants to know if pt should be sent to ED, [facility], or come in to the office. Recommended sending pt to DVT clinic, where she will be seen by a pharmacist to order the best anticoagulant therapy for the pt.   Sonographer to cb with DVT clinic information.   Please advise if you prefer to take a different course of action.

## 2022-03-16 NOTE — Discharge Instructions (Signed)
You are being started on Eliquis for a deep vein thrombosis or blood clot.  This can increase your risk of bleeding, both from traumatic events or spontaneously.  Discussed with your doctor about whether or not you should stay on the clopidogrel/Plavix.  For now you should stop aspirin.  If you develop chest pain, shortness of breath, dizziness or feel like you are in a pass out, or any other new/concerning symptoms then return to the ER or call 911.

## 2022-03-16 NOTE — ED Triage Notes (Signed)
Pt arrives to ED with c/o DVT. She reports right leg swelling that started 2-3 weeks ago. She had a [place] today which showed acute R DVT.

## 2022-03-16 NOTE — Progress Notes (Signed)
Patient had a right leg venous duplex exam done at [facility] office this afternoon.  She has a positive non-occlusive DVT in the Right proximal SFV down to the popiteal vein.   She indicates she has increasing SOB over the last 3 weeks.   Patient referred to ER.  Preliminary results found under the CV PROC tab in EPIC.    Salvadore Dom, RVT, RDCS, RDMS [facility].

## 2022-03-16 NOTE — Telephone Encounter (Signed)
Tried calling patient, but voicemail is full. Will try calling later.  

## 2022-03-16 NOTE — Telephone Encounter (Signed)
Spoke with Production assistant, radio, Asencion Partridge, who advised me she is being referred to DVT clinic

## 2022-03-16 NOTE — Telephone Encounter (Signed)
[name] called ( [phone]) stated the pt has DVT on right lower leg from groin on down . [name] sked should they send the pt to the emergency room or supply homehealth

## 2022-03-17 ENCOUNTER — Telehealth: Payer: Self-pay

## 2022-03-17 ENCOUNTER — Other Ambulatory Visit: Payer: Self-pay | Admitting: Family Medicine

## 2022-03-17 DIAGNOSIS — B3731 Acute candidiasis of vulva and vagina: Secondary | ICD-10-CM

## 2022-03-17 DIAGNOSIS — I824Z1 Acute embolism and thrombosis of unspecified deep veins of right distal lower extremity: Secondary | ICD-10-CM

## 2022-03-17 DIAGNOSIS — N184 Chronic kidney disease, stage 4 (severe): Secondary | ICD-10-CM

## 2022-03-17 DIAGNOSIS — I10 Essential (primary) hypertension: Secondary | ICD-10-CM

## 2022-03-17 MED ORDER — FLUCONAZOLE 150 MG PO TABS: 150.0000 mg | ORAL_TABLET | Freq: Once | ORAL | 0 refills | Status: AC

## 2022-03-17 NOTE — Telephone Encounter (Signed)
   CLINICAL USE BELOW THIS LINE (use X to signify action taken)  ___ Form received and placed in providers office for signature. _X__ Form completed and faxed to LOA Dept.  ___ Form completed & LVM to notify patient ready for pick up.  _X__ Charge sheet and copy of form in front office folder for office supervisor.

## 2022-03-17 NOTE — Telephone Encounter (Signed)
Spoke with [name] and verified dob of patient. She states that diflucan wasn't received by pharmacy. She also mentioned that after the ED visit the doctor stated that patient should start eliquis. She inquired if we would be able to prescribe that as well. Please advise.

## 2022-03-17 NOTE — Telephone Encounter (Signed)
PAPERWORK/FORMS received  Faxed by: [email or web address] Call back #: [phone] Individual made aware of 3-5 business day turn around (YES/NO):  GREEN charge sheet completed and patient made aware of possible charge (YES/NO): yes Placed in provider folder at front desk. ~~~ route to CMA/provider Team  CLINICAL USE BELOW THIS LINE (use X to signify action taken)  __X_ Form received and placed in providers office for signature. ___ Form completed and faxed to LOA Dept.  ___ Form completed & LVM to notify patient ready for pick up.  ___ Charge sheet and copy of form in front office folder for office supervisor.

## 2022-03-17 NOTE — Progress Notes (Signed)
Spoke with patient's daughter [name] and advised her of annotation below. She verbalized understanding.

## 2022-03-20 NOTE — Progress Notes (Signed)
[facility] [employer]  ASSESSMENT / PLAN: 87 y.o. female with right femoropopliteal non-occlusive deep venous thrombosis. Not a candidate for intervention based on age and anatomic location of clot. Recommend 3-6 months of anticoagulation with DOAC. Follow up with me on an as needed basis  CHIEF COMPLAINT: right leg swelling  HISTORY OF PRESENT ILLNESS: [name] is a 87 y.o. female referred to clinic by Dr. Grandville Silos for evaluation of right lower extremity deep venous thrombosis.  The patient reports lower extremity swelling about the ankle and foot in early November.  She was seen in the ER on 03/16/2022 and diagnosed with right femoral-popliteal deep venous thrombosis.  Patient has no family history of thrombophilia.  She denies a personal history of thrombophilia, but this is listed in her record.  She is fairly immobile and does not walk much.  She is feeling better after starting Xarelto.  We reviewed the natural history of venous thromboembolism, and the rationale for a set period of anticoagulation therapy, especially in an elderly patient.   Past Medical History:  Diagnosis Date   Abdominal pain 01/26/2012   Acute kidney injury superimposed on chronic kidney disease ([place]) 12/18/2014   Allergy    takes Mucinex daily as needed   Anemia, unspecified    Anxiety    takes Clonazepam daily as needed   Anxiety associated with depression 06/08/2017   Arthritis    Breast cancer ([place])    Breast cancer screening 02/19/2014   Breast mass 10/27/2014   Chronic diastolic CHF (congestive heart failure) ([place])    takes Furosemide daily   Chronic pain syndrome 02/06/2015   CKD (chronic kidney disease), stage III ([place])    Coronary artery disease    a. s/p IMI 2004 tx with BMS to RCA;  b. s/p Promus DES to RCA 2/12 c. abnormal nuc 2016 -> cath with med rx. d. NSTEMI 02/2020  mv CAD with severe ISR in pRCA and m/dRCA lesion both treated with DES.   Debility 08/01/2012    Dementia ([place])    Depression    takes Cymbalta daily   Diabetes mellitus    insulin daily   DOE (dyspnea on exertion) 06/24/2014   Dysphagia, unspecified(787.20) 07/31/2012   Fungal dermatitis 11/13/2007   Qualifier: Diagnosis of  By: Burnice Logan  MD, Doretha Sou    GERD (gastroesophageal reflux disease)    takes Protonix daily   Gout, unspecified    Headache    occasionally   History of blood clots    History of deep venous thrombosis 01/27/2012   History of pulmonary embolus (PE) 04/22/2014   Hyperkalemia 07/26/2017   Hyperlipidemia    takes Pravastatin daily   Hypertension    Insomnia    Joint pain    Joint swelling    Morbid obesity ([place])    Multiple benign nevi 11/15/2016   Myocardial infarction ([place]) 2004   Non-STEMI (non-ST elevated myocardial infarction) ([place]) 02/15/2020   Numbness    Obstructive sleep apnea    does not wear cpap   Osteoarthritis    Osteoarthritis    Osteoarthrosis, unspecified whether generalized or localized, lower leg    Pain in the chest 12/31/2013   Pain, chronic    Personal history of other diseases of the digestive system 12/03/2006   Qualifier: Diagnosis of  By: Burnice Logan  MD, Doretha Sou    Polymyalgia rheumatica [facility])    Presence of stent in right coronary artery    Pulmonary emboli ([place]) 01/2012  felt to need lifelong anticoagulation but Xarelto dc in 02/2020 due to need for DAPT   Situational depression 06/04/2009   Qualifier: Diagnosis of  By: Burnice Logan  MD, Doretha Sou    Syncope, vasovagal 07/04/2021   Urinary incontinence    takes Linzess daily   Vaginitis and vulvovaginitis 06/23/2016   Vertigo    hx of;was taking Meclizine if needed   Wears dentures     Past Surgical History:  Procedure Laterality Date   ABDOMINAL HYSTERECTOMY     partial   APPENDECTOMY     blood clots/legs and lungs  2013   BREAST BIOPSY Left 07/22/2014   BREAST BIOPSY Left 02/10/2013   BREAST LUMPECTOMY Left 11/05/2014   BREAST LUMPECTOMY WITH RADIOACTIVE SEED  LOCALIZATION Left 11/05/2014   Procedure: LEFT BREAST LUMPECTOMY WITH RADIOACTIVE SEED LOCALIZATION;  Surgeon: [name], MD;  Location: [place];  Service: General;  Laterality: Left;   CARDIAC CATHETERIZATION     COLONOSCOPY     CORONARY ANGIOPLASTY  2   CORONARY STENT INTERVENTION N/A 02/17/2020   Procedure: CORONARY STENT INTERVENTION;  Surgeon: Leonie Man, MD;  Location: [place] [facility];  Service: Cardiovascular;  Laterality: N/A;   ESOPHAGOGASTRODUODENOSCOPY (EGD) WITH PROPOFOL N/A 11/07/2016   Procedure: ESOPHAGOGASTRODUODENOSCOPY (EGD) WITH PROPOFOL;  Surgeon: Gatha Mayer, MD;  Location: [facility];  Service: Endoscopy;  Laterality: N/A;   EXCISION OF SKIN TAG Right 11/05/2014   Procedure: EXCISION OF RIGHT EYELID SKIN TAG;  Surgeon: [name], MD;  Location: [place];  Service: General;  Laterality: Right;   EYE SURGERY Bilateral    cataract    GASTRIC BYPASS  1977    reversed in 1979, [place] CATH AND CORONARY ANGIOGRAPHY N/A 02/17/2020   Procedure: LEFT HEART CATH AND CORONARY ANGIOGRAPHY;  Surgeon: Leonie Man, MD;  Location: [place] [facility];  Service: Cardiovascular;  Laterality: N/A;   LEFT HEART CATHETERIZATION WITH CORONARY ANGIOGRAM N/A 06/29/2014   Procedure: LEFT HEART CATHETERIZATION WITH CORONARY ANGIOGRAM;  Surgeon: Troy Sine, MD;  Location[facility] C[facility];  Service: Cardiovascular;  Laterality: N/A;   MEMBRANE PEEL Right 10/23/2018   Procedure: MEMBRANE PEEL;  Surgeon: Hurman Horn, MD;  Location: [place];  Service: Ophthalmology;  Laterality: Right;   MI with stent placement  2004   PARS PLANA VITRECTOMY Right 10/23/2018   Procedure: PARS PLANA VITRECTOMY WITH 25 GAUGE;  Surgeon: Hurman Horn, MD;  Location: [place];  Service: Ophthalmology;  Laterality: Right;    Family History  Problem Relation Age of Onset   Breast cancer Mother 42   Heart disease Mother    Throat cancer Father    Hypertension Father    Arthritis  Father    Diabetes Father    Arthritis Sister    Obesity Sister    Diabetes Sister    Heart disease Cousin    Colon cancer Neg Hx    Stomach cancer Neg Hx    Esophageal cancer Neg Hx     Social History   Socioeconomic History   Marital status: Widowed    Spouse name: Not on file   Number of children: 3   Years of education: Not on file   Highest education level: High school graduate  Occupational History   Occupation: retired  Tobacco Use   Smoking status: Never    Passive exposure: Past   Smokeless tobacco: Never   Tobacco comments:    Both parents smoked, patient  was exposed/ "Raised up in smoke, my whole life."  Vaping Use   Vaping Use: Never used  Substance and Sexual Activity   Alcohol use: No    Alcohol/week: 0.0 standard drinks of alcohol   Drug use: No   Sexual activity: Not Currently  Other Topics Concern   Not on file  Social History Narrative   Not on file   Social Determinants of Health   Financial Resource Strain: Low Risk  (10/14/2021)   Overall Financial Resource Strain (CARDIA)    Difficulty of Paying Living Expenses: Not very hard  Food Insecurity: No Food Insecurity (02/09/2022)   Hunger Vital Sign    Worried About Running Out of Food in the Last Year: Never true    Ran Out of Food in the Last Year: Never true  Transportation Needs: No Transportation Needs (02/09/2022)   PRAPARE - Hydrologist (Medical): No    Lack of Transportation (Non-Medical): No  Physical Activity: Inactive (07/26/2017)   Exercise Vital Sign    Days of Exercise per Week: 0 days    Minutes of Exercise per Session: 0 min  Stress: Stress Concern Present (07/26/2017)   [place]    Feeling of Stress : Rather much  Social Connections: Moderately Integrated (07/26/2017)   Social Connection and Isolation Panel [NHANES]    Frequency of Communication with Friends and Family: Once a week     Frequency of Social Gatherings with Friends and Family: Three times a week    Attends Religious Services: 1 to 4 times per year    Active Member of Clubs or Organizations: Yes    Attends Archivist Meetings: 1 to 4 times per year    Marital Status: Widowed  Intimate Partner Violence: Not At Risk (02/09/2022)   Humiliation, Afraid, Rape, and Kick questionnaire    Fear of Current or Ex-Partner: No    Emotionally Abused: No    Physically Abused: No    Sexually Abused: No    Allergies  Allergen Reactions   Sulfonamide Derivatives Swelling and Other (See Comments)    Mouth swelling- no resp issues noted   Lokelma [Sodium Zirconium Cyclosilicate] Other (See Comments)    Headache, stomach upset   Aricept [Donepezil Hcl] Diarrhea and Nausea And Vomiting    GI upset/loose stools   Tramadol Nausea And Vomiting    Current Outpatient Medications  Medication Sig Dispense Refill   albuterol (PROAIR HFA) 108 (90 Base) MCG/ACT inhaler Inhale 1-2 puffs into the lungs every 6 (six) hours as needed for wheezing or shortness of breath. 6.7 g 1   ALPRAZolam (XANAX) 0.5 MG tablet Take 1 tablet (0.5 mg total) by mouth 3 (three) times daily as needed (vertigo). 20 tablet 0   anastrozole (ARIMIDEX) 1 MG tablet TAKE 1 TABLET BY MOUTH EVERY DAY 28 tablet 1   APIXABAN (ELIQUIS) VTE STARTER PACK ('10MG'$  AND '5MG'$ ) Take as directed on package: start with 2 (two)-'5mg'$  tablets twice daily for 7 days. On day 8, switch to 1 (one)-'5mg'$  tablet twice daily. 74 each 0   atorvastatin (LIPITOR) 80 MG tablet TAKE 1 TABLET BY MOUTH EVERY DAY 90 tablet 3   budesonide-formoterol (SYMBICORT) 160-4.5 MCG/ACT inhaler Inhale 2 puffs into the lungs 2 (two) times daily. 1 each 3   carvedilol (COREG) 12.5 MG tablet TAKE 1 TABLET BY MOUTH 2 TIMES DAILY 180 tablet 1   cetirizine (ZYRTEC) 10 MG tablet Take 10 mg  by mouth daily as needed for allergies or rhinitis.     clopidogrel (PLAVIX) 75 MG tablet Take 1 tablet (75 mg total) by  mouth daily. 90 tablet 3   Continuous Blood Gluc Sensor (FREESTYLE LIBRE 14 DAY SENSOR) MISC PLACE 1 DEVICE ON THE SKIN AS DIRECTED EVERY 14 DAYS 6 each 2   diclofenac Sodium (VOLTAREN) 1 % GEL Apply 2 g topically 4 (four) times daily. 150 g 2   febuxostat (ULORIC) 40 MG tablet TAKE 1 TABLET BY MOUTH EVERY DAY 30 tablet 5   fluticasone (FLONASE) 50 MCG/ACT nasal spray Place 1 spray into both nostrils in the morning and at bedtime. 09.3 mL 3   folic acid (FOLVITE) 1 MG tablet TAKE 1 TABLET BY MOUTH EVERY DAY 90 tablet 1   guaifenesin (ROBITUSSIN) 100 MG/5ML syrup Take 200 mg by mouth 3 (three) times daily as needed for cough.     insulin aspart (NOVOLOG FLEXPEN) 100 UNIT/ML FlexPen Inject 14 Units into the skin 3 (three) times daily before meals.     insulin glargine, 2 Unit Dial, (TOUJEO MAX SOLOSTAR) 300 UNIT/ML Solostar Pen Inject 42 Units into the skin daily. Patient assistance program provides 15 mL 6   linaclotide (LINZESS) 145 MCG CAPS capsule Take 1 capsule (145 mcg total) by mouth daily before breakfast. 30 capsule 2   meclizine (ANTIVERT) 25 MG tablet TAKE 1 TABLET BY MOUTH THREE TIMES DAILY AS NEEDED FOR DIZZINESS 30 tablet 11   memantine (NAMENDA) 5 MG tablet TAKE 2 TABLETS BY MOUTH EVERY DAY and TAKE 1 TABLET BY MOUTH EVERY EVENING 270 tablet 5   nitroGLYCERIN (NITROSTAT) 0.4 MG SL tablet Take one tablet under the tongue every 5 minutes as needed for chest pain 50 tablet 0   nystatin cream (MYCOSTATIN) Apply 1 Application topically 2 (two) times daily. 30 g 3   ondansetron (ZOFRAN) 4 MG tablet Take 1 tablet (4 mg total) by mouth every 8 (eight) hours as needed for nausea or vomiting. 20 tablet 0   OVER THE COUNTER MEDICATION Take 1 capsule by mouth every other day. Over the counter stool softener - unknown type     oxyCODONE-acetaminophen (PERCOCET) 7.5-325 MG tablet Take 1 tablet by mouth every 12 (twelve) hours.     pantoprazole (PROTONIX) 20 MG tablet TAKE 1 TABLET BY MOUTH EVERY DAY  30 tablet 5   sodium polystyrene (SPS) 15 GM/60ML suspension take 2ms by MOUTH ONCE WEEKLY. TO keep potassium down 240 mL 2   torsemide (DEMADEX) 20 MG tablet TAKE 1 TABLET BY MOUTH EVERY DAY Take an extra dose for weight gain, 3lb in 1 day or 5lbs in 1 week 30 tablet 0   traZODone (DESYREL) 50 MG tablet Take 0.5 tablets (25 mg total) by mouth at bedtime. 45 tablet 3   Vitamin D, Ergocalciferol, (DRISDOL) 1.25 MG (50000 UNIT) CAPS capsule TAKE 1 CAPSULE BY MOUTH every 7 days 4 capsule 1   escitalopram (LEXAPRO) 20 MG tablet Take 0.5 tablets (10 mg total) by mouth daily for 7 days. Then stop 3.5 tablet 0   No current facility-administered medications for this visit.    PHYSICAL EXAM Vitals:   03/21/22 0828  BP: (!) 151/65  Pulse: 68  Resp: 20  Temp: 98.1 F (36.7 C)  SpO2: 97%  Weight: 295 lb (133.8 kg)  Height: '5\' 7"'$  (1.702 m)    No acute distress Regular rate and rhythm Unlabored breathing Right lower extremity mildly edematous about the foot and ankle.  Palpable  pedal pulses.     PERTINENT LABORATORY AND RADIOLOGIC DATA  Most recent CBC    Latest Ref Rng & Units 03/14/2022    3:03 PM 02/12/2022    7:22 AM 02/11/2022    6:06 AM  CBC  WBC 4.0 - 10.5 K/uL 10.5  10.9  11.3   Hemoglobin 12.0 - 15.0 g/dL 9.4  8.4  8.7   Hematocrit 36.0 - 46.0 % 28.9  27.6  29.3   Platelets 150.0 - 400.0 K/uL 183.0  212  224      Most recent CMP    Latest Ref Rng & Units 03/14/2022    3:03 PM 02/12/2022    7:22 AM 02/11/2022    6:06 AM  CMP  Glucose 70 - 99 mg/dL 329  191  149   BUN 6 - 23 mg/dL 60  66  69   Creatinine 0.40 - 1.20 mg/dL 2.19  2.11  2.59   Sodium 135 - 145 mEq/L 139  140  138   Potassium 3.5 - 5.1 mEq/L 4.7  4.7  4.6   Chloride 96 - 112 mEq/L 103  108  105   CO2 19 - 32 mEq/L '26  24  25   '$ Calcium 8.4 - 10.5 mg/dL 8.8  8.7  8.4   Total Protein 6.0 - 8.3 g/dL 7.4  7.0  7.0   Total Bilirubin 0.2 - 1.2 mg/dL 0.4  0.3  0.4   Alkaline Phos 39 - 117 U/L 74  65  67   AST 0  - 37 U/L '10  13  13   '$ ALT 0 - 35 U/L '7  10  10     '$ Renal function Estimated Creatinine Clearance: 26.8 mL/min (A) (by C-G formula based on SCr of 2.19 mg/dL (H)).  Hgb A1c MFr Bld (%)  Date Value  02/12/2022 7.1 (H)    LDL Cholesterol (Calc)  Date Value Ref Range Status  01/26/2021 42 mg/dL (calc) Final    Comment:    Reference range: <100 . Desirable range <100 mg/dL for primary prevention;   <70 mg/dL for patients with CHD or diabetic patients  with > or = 2 CHD risk factors. [name] LDL-C is now calculated using the Martin-Hopkins  calculation, which is a validated novel method providing  better accuracy than the Friedewald equation in the  estimation of LDL-C.  Cresenciano Genre et al. Annamaria Helling. 4008;676(19): 2061-2068  (http://education.[facility].com/faq/FAQ164)      Venous duplex 03/16/22 RIGHT:  - Findings consistent with acute deep vein thrombosis involving the right  femoral vein, and right popliteal vein.    LEFT:  - No evidence of common femoral vein obstruction.   Yevonne Aline. Stanford Breed, MD FACS [facility] Phone Number: [phone] 03/21/2022 8:46 AM   Total time spent on preparing this encounter including chart review, data review, collecting history, examining the patient, coordinating care for this new patient, 45 minutes.  Portions of this report may have been transcribed using voice recognition software.  Every effort has been made to ensure accuracy; however, inadvertent computerized transcription errors may still be present.

## 2022-03-21 ENCOUNTER — Ambulatory Visit (INDEPENDENT_AMBULATORY_CARE_PROVIDER_SITE_OTHER): Payer: HMO | Admitting: Vascular Surgery

## 2022-03-21 ENCOUNTER — Ambulatory Visit
Payer: HMO | Attending: Student in an Organized Health Care Education/Training Program | Admitting: Student in an Organized Health Care Education/Training Program

## 2022-03-21 ENCOUNTER — Encounter: Payer: Self-pay | Admitting: Vascular Surgery

## 2022-03-21 ENCOUNTER — Encounter: Payer: Self-pay | Admitting: Student in an Organized Health Care Education/Training Program

## 2022-03-21 VITALS — BP 133/43 | HR 73 | Temp 97.1°F | Resp 18 | Ht 67.0 in | Wt 295.0 lb

## 2022-03-21 VITALS — BP 151/65 | HR 68 | Temp 98.1°F | Resp 20 | Ht 67.0 in | Wt 295.0 lb

## 2022-03-21 DIAGNOSIS — M170 Bilateral primary osteoarthritis of knee: Secondary | ICD-10-CM | POA: Diagnosis not present

## 2022-03-21 DIAGNOSIS — G894 Chronic pain syndrome: Secondary | ICD-10-CM

## 2022-03-21 DIAGNOSIS — M79641 Pain in right hand: Secondary | ICD-10-CM | POA: Insufficient documentation

## 2022-03-21 DIAGNOSIS — M48062 Spinal stenosis, lumbar region with neurogenic claudication: Secondary | ICD-10-CM

## 2022-03-21 DIAGNOSIS — G8929 Other chronic pain: Secondary | ICD-10-CM | POA: Diagnosis not present

## 2022-03-21 DIAGNOSIS — M25521 Pain in right elbow: Secondary | ICD-10-CM | POA: Diagnosis not present

## 2022-03-21 DIAGNOSIS — I82411 Acute embolism and thrombosis of right femoral vein: Secondary | ICD-10-CM

## 2022-03-21 DIAGNOSIS — M47816 Spondylosis without myelopathy or radiculopathy, lumbar region: Secondary | ICD-10-CM | POA: Diagnosis not present

## 2022-03-21 MED ORDER — OXYCODONE-ACETAMINOPHEN 7.5-325 MG PO TABS: 1.0000 | ORAL_TABLET | Freq: Two times a day (BID) | ORAL | 0 refills | Status: DC

## 2022-03-21 NOTE — Progress Notes (Signed)
PROVIDER NOTE: Information contained herein reflects review and annotations entered in association with encounter. Interpretation of such information and data should be left to medically-trained personnel. Information provided to patient can be located elsewhere in the medical record under "Patient Instructions". Document created using STT-dictation technology, any transcriptional errors that may result from process are unintentional.    Patient: [name]  Service Category: E/M  Provider: Gillis Santa, MD  DOB: 1934-08-07  DOS: 03/21/2022  Specialty: Interventional Pain Management  MRN: [id number]  Setting: Ambulatory outpatient  PCP: Bonnita Hollow, MD  Type: Established Patient    Referring Provider: Bonnita Hollow, MD  Location: Office  Delivery: Face-to-face     HPI  Ms. MADELYNE [name], a 87 y.o. year old female, is here today because of her Chronic pain of right hand [M79.641, G89.29]. Ms. Storck primary complain today is Shoulder Pain (left)  Last encounter: My last encounter with her was on 12/27/21  Pertinent problems: [name] has Chronic radicular lumbar pain; Lumbar spondylosis; Spinal stenosis, lumbar region, with neurogenic claudication; and Bilateral primary osteoarthritis of knee on their pertinent problem list. Pain Assessment: Severity of Chronic pain is reported as a 7 /10. Location: Shoulder Left/radiates to elbow. Onset: More than a month ago. Quality: Aching. Timing: Constant. Modifying factor(s): meds, heat. Vitals:  height is 5' 7" (1.702 m) and weight is 295 lb (133.8 kg). Her temperature is 97.1 F (36.2 C) (abnormal). Her blood pressure is 133/43 (abnormal) and her pulse is 73. Her respiration is 18 and oxygen saturation is 97%.   Reason for encounter: medication management.    [name] presents today for medication management.  Unfortunately, she she went to the emergency department on 03/16/2022 for right leg pain secondary to a DVT related to the swelling  that she was having in her right leg.  She was started on Eliquis for her right lower extremity DVT.  Otherwise no significant changes in her medical history.  Continue Percocet as prescribed.  We will renew urine toxicology screen today for medication compliance monitoring.  Discussed exercises and heating pad for her left shoulder to maintain some mobility and range of motion.  I also discussed TENS unit that she can apply to her left shoulder as well as Voltaren cream.   Pharmacotherapy Assessment    Analgesic: Percocet 7.5 mg twice daily as needed, quantity 60/month  Monitoring: [place] PMP: PDMP reviewed during this encounter.       Pharmacotherapy: No side-effects or adverse reactions reported. Compliance: No problems identified. Effectiveness: Clinically acceptable.  Dewayne Shorter, RN  03/21/2022 10:48 AM  Sign when Signing Visit Nursing Pain Medication Assessment:  Safety precautions to be maintained throughout the outpatient stay will include: orient to surroundings, keep bed in low position, maintain call bell within reach at all times, provide assistance with transfer out of bed and ambulation.  Medication Inspection Compliance: [name] did not comply with our request to bring her pills to be counted. She was reminded that bringing the medication bottles, even when empty, is a requirement.  Medication: None brought in. Pill/Patch Count: None available to be counted. Bottle Appearance: No container available. Did not bring bottle(s) to appointment. Filled Date: N/A Last Medication intake:  Today     UDS: : Final result   Visible to patient: Yes (not seen)   Next appt: 10/26/2020 at 01:45 PM in Podiatry [name], DPM)   Dx: Chronic pain syndrome   0 Result Notes   Ref  Range & Units 6 mo ago  Amphetamines, IA Cutoff:50 ng/mL Negative   Barbiturates, IA Cutoff:0.1 ug/mL Negative   Benzodiazepines, IA Cutoff:20 ng/mL Negative   Cocaine & Metabolite, IA Cutoff:25 ng/mL  Negative   Phencyclidine, IA Cutoff:8 ng/mL Negative   THC(Marijuana) Metabolite, IA Cutoff:5 ng/mL Negative   Opiates, IA Cutoff:5 ng/mL Negative   Oxycodones, IA Cutoff:5 ng/mL Negative   Comment: Presumptive immunoassay result indicated need for further  testing; definitive confirmation was negative.   Methadone, IA Cutoff:25 ng/mL Negative   Propoxyphene, IA Cutoff:50 ng/mL Negative   Comment: This test was developed and its performance characteristics  determined by [facility].  It has not been cleared or approved  by the Food and Drug Administration.         ROS  Constitutional: Denies any fever or chills Gastrointestinal: No reported hemesis, hematochezia, vomiting, or acute GI distress Musculoskeletal:  left shoulder pain, low back pain Neurological: No reported episodes of acute onset apraxia, aphasia, dysarthria, agnosia, amnesia, paralysis, loss of coordination, or loss of consciousness  Medication Review  ALPRAZolam, Apixaban Starter Pack (57m and 587m, FreeStyle Libre 14 Day Sensor, OVER THE COUNTER MEDICATION, Vitamin D (Ergocalciferol), albuterol, anastrozole, atorvastatin, budesonide-formoterol, carvedilol, cetirizine, clopidogrel, diclofenac Sodium, escitalopram, febuxostat, fluticasone, folic acid, guaifenesin, insulin aspart, insulin glargine (2 Unit Dial), linaclotide, meclizine, memantine, nitroGLYCERIN, nystatin cream, ondansetron, oxyCODONE-acetaminophen, pantoprazole, sodium polystyrene, torsemide, and traZODone  History Review  Allergy: [name] allergic to sulfonamide derivatives, lokelma [sodium zirconium cyclosilicate], aricept [donepezil hcl], and tramadol. Drug: Ms. ClBubbreports no history of drug use. Alcohol:  reports no history of alcohol use. Tobacco:  reports that she has never smoked. She has been exposed to tobacco smoke. She has never used smokeless tobacco. Social: Ms. ClRehmreports that she has never smoked. She has been exposed to tobacco  smoke. She has never used smokeless tobacco. She reports that she does not drink alcohol and does not use drugs. Medical:  has a past medical history of Abdominal pain (01/26/2012), Acute kidney injury superimposed on chronic kidney disease (HCAvon(12/18/2014), Allergy, Anemia, unspecified, Anxiety, Anxiety associated with depression (06/08/2017), Arthritis, Breast cancer ([facility] Breast cancer screening (02/19/2014), Breast mass (10/27/2014), Chronic diastolic CHF (congestive heart failure) (HC[facility] Chronic pain syndrome (02/06/2015), CKD (chronic kidney disease), stage III (HCHanaf[facility] Coronary artery disease, Debility (08/01/2012), Dementia ([facility] Depression, Diabetes mellitus, DOE (dyspnea on exertion) (06/24/2014), Dysphagia, unspecified(787.20) (07/31/2012), Fungal dermatitis (11/13/2007), GERD (gastroesophageal reflux disease), Gout, unspecified, Headache, History of blood clots, History of deep venous thrombosis (01/27/2012), History of pulmonary embolus (PE) (04/22/2014), Hyperkalemia (07/26/2017), Hyperlipidemia, Hypertension, Insomnia, Joint pain, Joint swelling, Morbid obesity (HCFulshe[facility] Multiple benign nevi (11/15/2016), Myocardial infarction (HCChen[facility](2004), Non-STEMI (non-ST elevated myocardial infarction) (HCGordon(02/15/2020), Numbness, Obstructive sleep apnea, Osteoarthritis, Osteoarthritis, Osteoarthrosis, unspecified whether generalized or localized, lower leg, Pain in the chest (12/31/2013), Pain, chronic, Personal history of other diseases of the digestive system (12/03/2006), Polymyalgia rheumatica (HCPitt[place] Presence of stent in right coronary artery, Pulmonary emboli (HC[facility](01/2012), Situational depression (06/04/2009), Syncope, vasovagal (07/04/2021), Urinary incontinence, Vaginitis and vulvovaginitis (06/23/2016), Vertigo, and Wears dentures. Surgical: Ms. ClHiratahas a past surgical history that includes Appendectomy; Cardiac catheterization; Gastric bypass (1977 ); blood clots/legs and lungs (2013); MI with stent placement  (2004); left heart catheterization with coronary angiogram (N/A, 06/29/2014); Coronary angioplasty (2); Eye surgery (Bilateral); Colonoscopy; Abdominal hysterectomy; Breast lumpectomy with radioactive seed localization (Left, 11/05/2014); Excision of skin tag (Right, 11/05/2014); Breast lumpectomy (Left, 11/05/2014); Breast biopsy (Left, 07/22/2014); Breast biopsy (Left, 02/10/2013); Esophagogastroduodenoscopy (egd)  with propofol (N/A, 11/07/2016); Pars plana vitrectomy (Right, 10/23/2018); Membrane peel (Right, 10/23/2018); LEFT HEART CATH AND CORONARY ANGIOGRAPHY (N/A, 02/17/2020); and CORONARY STENT INTERVENTION (N/A, 02/17/2020). Family: family history includes Arthritis in her father and sister; Breast cancer (age of onset: 58) in her mother; Diabetes in her father and sister; Heart disease in her cousin and mother; Hypertension in her father; Obesity in her sister; Throat cancer in her father.  Laboratory Chemistry Profile   Renal Lab Results  Component Value Date   BUN 60 (H) 03/14/2022   CREATININE 2.19 (H) 03/14/2022   LABCREA 36 06/14/2017   BCR 23 (H) 08/24/2021   GFR 20.07 (L) 03/14/2022   GFRAA 38 (L) 03/02/2020   GFRNONAA 23 (L) 02/12/2022     Hepatic Lab Results  Component Value Date   AST 10 03/14/2022   ALT 7 03/14/2022   ALBUMIN 3.5 03/14/2022   ALKPHOS 74 03/14/2022   LIPASE 35 02/08/2022     Electrolytes Lab Results  Component Value Date   NA 139 03/14/2022   K 4.7 03/14/2022   CL 103 03/14/2022   CALCIUM 8.8 03/14/2022   MG 1.3 (L) 11/23/2021   PHOS 3.7 10/09/2017     Bone Lab Results  Component Value Date   VD25OH 64.31 09/09/2021     Inflammation (CRP: Acute Phase) (ESR: Chronic Phase) Lab Results  Component Value Date   CRP 1.0 (H) 01/30/2019   ESRSEDRATE 110 (H) 09/25/2014   LATICACIDVEN 1.2 02/08/2022       Note: Above Lab results reviewed.  Recent Imaging Review  VAS [place] LOWER EXTREMITY VENOUS (DVT)  Lower Venous DVT Study  Patient Name:   NICHOLETTE DOLSON  Date of Exam:   03/16/2022 Medical Rec #: [id number]        Accession #:    [id number] Date of Birth: 22-Oct-1934        Patient Gender: F Patient Age:   30 years Exam Location:  [facility] Procedure:      VAS [place] LOWER EXTREMITY VENOUS (DVT) Referring Phys: Josephine Igo  --------------------------------------------------------------------------------   Indications: Right calf swelling x 3 weeks. Patient noticed increasing SOB over the last 3 weeks. She denies chest pains.   Anticoagulation: Plavix and aspirin only. Comparison       Bilateral leg venous duplex done 03/03/2020 was negative for Study:           DVT.  Performing Technologist: Salvadore Dom RVT, RDCS (AE), RDMS    Examination Guidelines: A complete evaluation includes B-mode imaging, spectral Doppler, color Doppler, and power Doppler as needed of all accessible portions of each vessel. Bilateral testing is considered an integral part of a complete examination. Limited examinations for reoccurring indications may be performed as noted. The reflux portion of the exam is performed with the patient in reverse Trendelenburg.     +---------+---------------+---------+-----------+----------+--------------+ RIGHT    CompressibilityPhasicitySpontaneityPropertiesThrombus Aging +---------+---------------+---------+-----------+----------+--------------+ CFV      Full           Yes      Yes                                 +---------+---------------+---------+-----------+----------+--------------+ SFJ      Full           Yes      Yes                                 +---------+---------------+---------+-----------+----------+--------------+  FV Prox  Partial        Yes      No                                  +---------+---------------+---------+-----------+----------+--------------+ FV Mid   Partial        Yes      No                                   +---------+---------------+---------+-----------+----------+--------------+ FV DistalPartial        Yes      No                                  +---------+---------------+---------+-----------+----------+--------------+ PFV      Full                                                        +---------+---------------+---------+-----------+----------+--------------+ POP      Partial        Yes      No                                  +---------+---------------+---------+-----------+----------+--------------+ PTV      Full           Yes      Yes                                 +---------+---------------+---------+-----------+----------+--------------+ PERO     Full           Yes      Yes                                 +---------+---------------+---------+-----------+----------+--------------+ Gastroc  Full                                                        +---------+---------------+---------+-----------+----------+--------------+ GSV      Full           Yes      Yes                                 +---------+---------------+---------+-----------+----------+--------------+        +----+---------------+---------+-----------+----------+--------------+ LEFTCompressibilityPhasicitySpontaneityPropertiesThrombus Aging +----+---------------+---------+-----------+----------+--------------+ CFV Full           Yes      Yes                                 +----+---------------+---------+-----------+----------+--------------+           Findings reported to Asencion Partridge, RN at Dr. Ellison Carwin office, Lenise Herald, RN and Rebbeca Paul at 1:30 pm . Patient referred to ER.  Summary: RIGHT: - Findings consistent with acute deep vein thrombosis involving the right femoral vein, and right popliteal vein.   LEFT: - No evidence of common femoral vein obstruction.    *See table(s) above for measurements and  observations.  Electronically signed by Harold Barban MD on 03/16/2022 at 8:58:17 PM.      Final    Note: Reviewed        Physical Exam  General appearance: Well nourished, well developed, and well hydrated. In no apparent acute distress Mental status: Alert, oriented x 3 (person, place, & time)       Respiratory: No evidence of acute respiratory distress Eyes: PERLA Vitals: BP (!) 133/43   Pulse 73   Temp (!) 97.1 F (36.2 C)   Resp 18   Ht 5' 7" (1.702 m)   Wt 295 lb (133.8 kg)   SpO2 97%   BMI 46.20 kg/m  BMI: Estimated body mass index is 46.2 kg/m as calculated from the following:   Height as of this encounter: 5' 7" (1.702 m).   Weight as of this encounter: 295 lb (133.8 kg). Ideal: Ideal body weight: 61.6 kg (135 lb 12.9 oz) Adjusted ideal body weight: 90.5 kg (199 lb 7.7 oz)  Thoracic Spine Area Exam  Skin & Axial Inspection: No masses, redness, or swelling Alignment: Symmetrical Functional ROM: Pain restricted ROM Stability: No instability detected Muscle Tone/Strength: Functionally intact. No obvious neuro-muscular anomalies detected. Sensory (Neurological): Unimpaired Muscle strength & Tone: No palpable anomalies  Bilateral shoulder pain  Right hand pain   Lumbar Exam  Skin & Axial Inspection: No masses, redness, or swelling Alignment: Symmetrical Functional ROM: Pain restricted ROM       Stability: No instability detected Muscle Tone/Strength: Functionally intact. No obvious neuro-muscular anomalies detected. Sensory (Neurological): Musculoskeletal pain pattern  Ambulation: Patient came in today in a wheel chair Gait: Modified gait pattern (slower gait speed, wider stride width, and longer stance duration) associated with morbid obesity Posture: Difficulty standing up straight, due to pain    Lower Extremity Exam      Side: Right lower extremity   Side: Left lower extremity  Stability: No instability observed           Stability: No instability  observed          Skin & Extremity Inspection: Skin color, temperature, and hair growth are WNL. No peripheral edema or cyanosis. No masses, redness, swelling, asymmetry, or associated skin lesions. No contractures.   Skin & Extremity Inspection: Skin color, temperature, and hair growth are WNL. No peripheral edema or cyanosis. No masses, redness, swelling, asymmetry, or associated skin lesions. No contractures.  Functional ROM: Pain restricted ROM for all joints of the lower extremity           Functional ROM: Pain restricted ROM for all joints of the lower extremity          Muscle Tone/Strength: Functionally intact. No obvious neuro-muscular anomalies detected.   Muscle Tone/Strength: Functionally intact. No obvious neuro-muscular anomalies detected.  Sensory (Neurological): Neurogenic pain pattern         Sensory (Neurological): Neurogenic pain pattern        DTR: Patellar: deferred today Achilles: deferred today Plantar: deferred today   DTR: Patellar: deferred today Achilles: deferred today Plantar: deferred today  Palpation: No palpable anomalies   Palpation: No palpable anomalies     Assessment   Status Diagnosis  Persistent Persistent Persistent 1. Chronic pain of right hand   2. Lumbar  spondylosis   3. Right elbow pain   4. Spinal stenosis, lumbar region, with neurogenic claudication   5. Bilateral primary osteoarthritis of knee   6. Lumbar facet arthropathy   7. Chronic pain syndrome       Plan of Care  [name] has a current medication list which includes the following long-term medication(s): albuterol, apixaban starter pack (29m and 511m, atorvastatin, budesonide-formoterol, carvedilol, febuxostat, fluticasone, toujeo max solostar, linaclotide, memantine, pantoprazole, torsemide, trazodone, and escitalopram.  Pharmacotherapy (Medications Ordered): Meds ordered this encounter  Medications   oxyCODONE-acetaminophen (PERCOCET) 7.5-325 MG tablet    Sig:  Take 1 tablet by mouth every 12 (twelve) hours.    Dispense:  60 tablet    Refill:  0   oxyCODONE-acetaminophen (PERCOCET) 7.5-325 MG tablet    Sig: Take 1 tablet by mouth every 12 (twelve) hours.    Dispense:  60 tablet    Refill:  0    Orders Placed This Encounter  Procedures   ToxASSURE Select 13 (MW), Urine    Volume: 30 ml(s). Minimum 3 ml of urine is needed. Document temperature of fresh sample. Indications: Long term (current) use of opiate analgesic ([id number]   Order Specific Question:   Release to patient    Answer:   Immediate     Follow-up plan:   Return in about 3 months (around 06/21/2022) for Medication Management, in person.      Interventional management options:  Considering:   Intra-articular knee Hyalgan injections can be done in wheelchair Patient is on Plavix and is morbidly obese and unable to get on fluoroscopy table for interventional therapy.   PRN Procedures:   None at this time      Recent Visits Date Type Provider Dept  12/27/21 Office Visit LaGillis SantaMD [facility]  Showing recent visits within past 90 days and meeting all other requirements Today's Visits Date Type Provider Dept  03/21/22 Office Visit LaGillis SantaMD [facility]  Showing today's visits and meeting all other requirements Future Appointments No visits were found meeting these conditions. Showing future appointments within next 90 days and meeting all other requirements  I discussed the assessment and treatment plan with the patient. The patient was provided an opportunity to ask questions and all were answered. The patient agreed with the plan and demonstrated an understanding of the instructions.  Patient advised to call back or seek an in-person evaluation if the symptoms or condition worsens.  Duration of encounter: 30 minutes.  Note by: BiGillis SantaMD Date: 03/21/2022; Time: 11:19 AM

## 2022-03-21 NOTE — Progress Notes (Signed)
Nursing Pain Medication Assessment:  Safety precautions to be maintained throughout the outpatient stay will include: orient to surroundings, keep bed in low position, maintain call bell within reach at all times, provide assistance with transfer out of bed and ambulation.  Medication Inspection Compliance: [name] did not comply with our request to bring her pills to be counted. She was reminded that bringing the medication bottles, even when empty, is a requirement.  Medication: None brought in. Pill/Patch Count: None available to be counted. Bottle Appearance: No container available. Did not bring bottle(s) to appointment. Filled Date: N/A Last Medication intake:  Today

## 2022-03-22 ENCOUNTER — Telehealth: Payer: Self-pay | Admitting: Family Medicine

## 2022-03-22 NOTE — Telephone Encounter (Signed)
Left patient a detailed voice message for patient's daughter to contact vascular surgeon for concerns below.

## 2022-03-22 NOTE — Telephone Encounter (Signed)
Caller Name: [name] Call back phone #: [phone]  Reason for Call: Please call back for clarification on blood thinners. Went to [facility] last Thursday (11/09) for blood clot on right leg. Started Eliquois and advised to stop aspirin. Should she continue with Clopidogrel? Flagged as possible interaction with Eliquis

## 2022-03-22 NOTE — Telephone Encounter (Signed)
Advised [name] of annotation below

## 2022-03-24 ENCOUNTER — Telehealth: Payer: Self-pay

## 2022-03-24 LAB — TOXASSURE SELECT 13 (MW), URINE

## 2022-03-24 NOTE — Telephone Encounter (Signed)
Pt's daughter [name] called to get clarification on which anticoagulants pt is to be on. Per MD,, "only needs Eliquis from my standpoint. No need or ASA or Plavix for DVT". [name] verbalized understanding of this. No further questions/concerns at this time.

## 2022-03-25 ENCOUNTER — Telehealth: Payer: Self-pay | Admitting: Vascular Surgery

## 2022-03-25 NOTE — Telephone Encounter (Signed)
This is an 87 year old woman who presented with a 3-week history of calf swelling.  On 03/16/2022 she had a venous duplex scan which showed a DVT involving the right femoral vein and popliteal vein.  She was started on Xarelto at that time.  Dr. Stanford Breed saw her in consultation on 03/21/2022 and recommended 3 to 6 months of a DOAC.  She was also on Plavix but I believe she was told to stop the Plavix since she was now on the [place].   The daughter called me tonight because she had 1 bowel movement that appeared dark and she was concerned about bleeding.  The daughter states that she is not on aspirin or Plavix.  I have asked her to hold her Eliquis tomorrow.  She has already taken both doses today.  If she has any more evidence of bleeding she would have to come to the emergency department to be evaluated.  Otherwise I have encouraged her to follow-up with her primary care physician Dr. Grandville Silos on Monday and consider a GI referral as an outpatient.  If she has recurrent bleeding she will have to stop her Eliquis and could be considered for an IVC filter if this cannot be resumed.  Gae Gallop, MD 10:22 PM

## 2022-03-26 ENCOUNTER — Emergency Department (HOSPITAL_BASED_OUTPATIENT_CLINIC_OR_DEPARTMENT_OTHER): Payer: PPO

## 2022-03-26 ENCOUNTER — Inpatient Hospital Stay (HOSPITAL_BASED_OUTPATIENT_CLINIC_OR_DEPARTMENT_OTHER)
Admission: EM | Admit: 2022-03-26 | Discharge: 2022-03-29 | DRG: 378 | Disposition: A | Discharge status: Home / Self Care | Payer: PPO | Attending: Internal Medicine | Admitting: Internal Medicine

## 2022-03-26 ENCOUNTER — Encounter (HOSPITAL_COMMUNITY): Payer: Self-pay

## 2022-03-26 ENCOUNTER — Other Ambulatory Visit: Payer: Self-pay

## 2022-03-26 ENCOUNTER — Encounter (HOSPITAL_BASED_OUTPATIENT_CLINIC_OR_DEPARTMENT_OTHER): Payer: Self-pay

## 2022-03-26 DIAGNOSIS — I82411 Acute embolism and thrombosis of right femoral vein: Secondary | ICD-10-CM | POA: Diagnosis present

## 2022-03-26 DIAGNOSIS — R0602 Shortness of breath: Secondary | ICD-10-CM | POA: Diagnosis not present

## 2022-03-26 DIAGNOSIS — D631 Anemia in chronic kidney disease: Secondary | ICD-10-CM | POA: Diagnosis present

## 2022-03-26 DIAGNOSIS — N189 Chronic kidney disease, unspecified: Secondary | ICD-10-CM | POA: Diagnosis not present

## 2022-03-26 DIAGNOSIS — K8020 Calculus of gallbladder without cholecystitis without obstruction: Secondary | ICD-10-CM | POA: Diagnosis present

## 2022-03-26 DIAGNOSIS — E785 Hyperlipidemia, unspecified: Secondary | ICD-10-CM | POA: Diagnosis present

## 2022-03-26 DIAGNOSIS — I252 Old myocardial infarction: Secondary | ICD-10-CM | POA: Diagnosis not present

## 2022-03-26 DIAGNOSIS — Z853 Personal history of malignant neoplasm of breast: Secondary | ICD-10-CM | POA: Diagnosis not present

## 2022-03-26 DIAGNOSIS — R0600 Dyspnea, unspecified: Secondary | ICD-10-CM

## 2022-03-26 DIAGNOSIS — Z833 Family history of diabetes mellitus: Secondary | ICD-10-CM

## 2022-03-26 DIAGNOSIS — I130 Hypertensive heart and chronic kidney disease with heart failure and stage 1 through stage 4 chronic kidney disease, or unspecified chronic kidney disease: Secondary | ICD-10-CM | POA: Diagnosis present

## 2022-03-26 DIAGNOSIS — K219 Gastro-esophageal reflux disease without esophagitis: Secondary | ICD-10-CM | POA: Diagnosis present

## 2022-03-26 DIAGNOSIS — I440 Atrioventricular block, first degree: Secondary | ICD-10-CM | POA: Diagnosis present

## 2022-03-26 DIAGNOSIS — J449 Chronic obstructive pulmonary disease, unspecified: Secondary | ICD-10-CM | POA: Diagnosis present

## 2022-03-26 DIAGNOSIS — F0394 Unspecified dementia, unspecified severity, with anxiety: Secondary | ICD-10-CM | POA: Diagnosis present

## 2022-03-26 DIAGNOSIS — Z90711 Acquired absence of uterus with remaining cervical stump: Secondary | ICD-10-CM

## 2022-03-26 DIAGNOSIS — Z6841 Body Mass Index (BMI) 40.0 and over, adult: Secondary | ICD-10-CM | POA: Diagnosis not present

## 2022-03-26 DIAGNOSIS — Z7901 Long term (current) use of anticoagulants: Secondary | ICD-10-CM | POA: Diagnosis not present

## 2022-03-26 DIAGNOSIS — F411 Generalized anxiety disorder: Secondary | ICD-10-CM | POA: Diagnosis present

## 2022-03-26 DIAGNOSIS — E1165 Type 2 diabetes mellitus with hyperglycemia: Secondary | ICD-10-CM | POA: Diagnosis present

## 2022-03-26 DIAGNOSIS — K921 Melena: Secondary | ICD-10-CM | POA: Diagnosis present

## 2022-03-26 DIAGNOSIS — I82409 Acute embolism and thrombosis of unspecified deep veins of unspecified lower extremity: Secondary | ICD-10-CM | POA: Diagnosis not present

## 2022-03-26 DIAGNOSIS — F0390 Unspecified dementia without behavioral disturbance: Secondary | ICD-10-CM | POA: Diagnosis not present

## 2022-03-26 DIAGNOSIS — E1122 Type 2 diabetes mellitus with diabetic chronic kidney disease: Secondary | ICD-10-CM | POA: Diagnosis present

## 2022-03-26 DIAGNOSIS — J432 Centrilobular emphysema: Secondary | ICD-10-CM | POA: Diagnosis not present

## 2022-03-26 DIAGNOSIS — J301 Allergic rhinitis due to pollen: Secondary | ICD-10-CM

## 2022-03-26 DIAGNOSIS — I10 Essential (primary) hypertension: Secondary | ICD-10-CM | POA: Diagnosis not present

## 2022-03-26 DIAGNOSIS — Z794 Long term (current) use of insulin: Secondary | ICD-10-CM | POA: Diagnosis not present

## 2022-03-26 DIAGNOSIS — Z9884 Bariatric surgery status: Secondary | ICD-10-CM

## 2022-03-26 DIAGNOSIS — Z8261 Family history of arthritis: Secondary | ICD-10-CM

## 2022-03-26 DIAGNOSIS — J309 Allergic rhinitis, unspecified: Secondary | ICD-10-CM | POA: Diagnosis present

## 2022-03-26 DIAGNOSIS — E1121 Type 2 diabetes mellitus with diabetic nephropathy: Secondary | ICD-10-CM | POA: Diagnosis present

## 2022-03-26 DIAGNOSIS — Z862 Personal history of diseases of the blood and blood-forming organs and certain disorders involving the immune mechanism: Secondary | ICD-10-CM

## 2022-03-26 DIAGNOSIS — I5032 Chronic diastolic (congestive) heart failure: Secondary | ICD-10-CM | POA: Diagnosis present

## 2022-03-26 DIAGNOSIS — K922 Gastrointestinal hemorrhage, unspecified: Secondary | ICD-10-CM

## 2022-03-26 DIAGNOSIS — E119 Type 2 diabetes mellitus without complications: Secondary | ICD-10-CM

## 2022-03-26 DIAGNOSIS — Z8249 Family history of ischemic heart disease and other diseases of the circulatory system: Secondary | ICD-10-CM

## 2022-03-26 DIAGNOSIS — J441 Chronic obstructive pulmonary disease with (acute) exacerbation: Secondary | ICD-10-CM | POA: Diagnosis present

## 2022-03-26 DIAGNOSIS — Z808 Family history of malignant neoplasm of other organs or systems: Secondary | ICD-10-CM

## 2022-03-26 DIAGNOSIS — E66813 Obesity, class 3: Secondary | ICD-10-CM | POA: Diagnosis present

## 2022-03-26 DIAGNOSIS — Z803 Family history of malignant neoplasm of breast: Secondary | ICD-10-CM

## 2022-03-26 DIAGNOSIS — R0789 Other chest pain: Secondary | ICD-10-CM | POA: Diagnosis not present

## 2022-03-26 DIAGNOSIS — N281 Cyst of kidney, acquired: Secondary | ICD-10-CM | POA: Diagnosis not present

## 2022-03-26 DIAGNOSIS — I2699 Other pulmonary embolism without acute cor pulmonale: Secondary | ICD-10-CM | POA: Diagnosis not present

## 2022-03-26 DIAGNOSIS — Z86711 Personal history of pulmonary embolism: Secondary | ICD-10-CM

## 2022-03-26 DIAGNOSIS — N1832 Chronic kidney disease, stage 3b: Secondary | ICD-10-CM | POA: Diagnosis present

## 2022-03-26 DIAGNOSIS — Z66 Do not resuscitate: Secondary | ICD-10-CM | POA: Diagnosis present

## 2022-03-26 DIAGNOSIS — Z955 Presence of coronary angioplasty implant and graft: Secondary | ICD-10-CM

## 2022-03-26 DIAGNOSIS — I82431 Acute embolism and thrombosis of right popliteal vein: Secondary | ICD-10-CM | POA: Diagnosis present

## 2022-03-26 DIAGNOSIS — I2510 Atherosclerotic heart disease of native coronary artery without angina pectoris: Secondary | ICD-10-CM | POA: Diagnosis present

## 2022-03-26 HISTORY — DX: Acute embolism and thrombosis of right femoral vein: I82.411

## 2022-03-26 HISTORY — DX: Personal history of diseases of the blood and blood-forming organs and certain disorders involving the immune mechanism: Z86.2

## 2022-03-26 HISTORY — DX: Gastrointestinal hemorrhage, unspecified: K92.2

## 2022-03-26 HISTORY — DX: Chronic kidney disease, unspecified: N18.9

## 2022-03-26 LAB — CBC
HCT: 31.4 % — ABNORMAL LOW (ref 36.0–46.0)
HCT: 31.1 % — ABNORMAL LOW (ref 36.0–46.0)
Hemoglobin: 9.5 g/dL — ABNORMAL LOW (ref 12.0–15.0)
Hemoglobin: 9.4 g/dL — ABNORMAL LOW (ref 12.0–15.0)
MCH: 28.6 pg (ref 26.0–34.0)
MCH: 29.2 pg (ref 26.0–34.0)
MCHC: 30.3 g/dL (ref 30.0–36.0)
MCHC: 30.2 g/dL (ref 30.0–36.0)
MCV: 94.6 fL (ref 80.0–100.0)
MCV: 96.6 fL (ref 80.0–100.0)
Platelets: 264 10*3/uL (ref 150–400)
Platelets: 263 10*3/uL (ref 150–400)
RBC: 3.32 MIL/uL — ABNORMAL LOW (ref 3.87–5.11)
RBC: 3.22 MIL/uL — ABNORMAL LOW (ref 3.87–5.11)
RDW: 13.8 % (ref 11.5–15.5)
RDW: 13.6 % (ref 11.5–15.5)
WBC: 9.6 10*3/uL (ref 4.0–10.5)
WBC: 9.9 10*3/uL (ref 4.0–10.5)
nRBC: 0 % (ref 0.0–0.2)
nRBC: 0 % (ref 0.0–0.2)

## 2022-03-26 LAB — COMPREHENSIVE METABOLIC PANEL
ALT: 8 U/L (ref 0–44)
AST: 11 U/L — ABNORMAL LOW (ref 15–41)
Albumin: 3.5 g/dL (ref 3.5–5.0)
Alkaline Phosphatase: 81 U/L (ref 38–126)
Anion gap: 11 (ref 5–15)
BUN: 52 mg/dL — ABNORMAL HIGH (ref 8–23)
CO2: 27 mmol/L (ref 22–32)
Calcium: 8.8 mg/dL — ABNORMAL LOW (ref 8.9–10.3)
Chloride: 102 mmol/L (ref 98–111)
Creatinine, Ser: 2.13 mg/dL — ABNORMAL HIGH (ref 0.44–1.00)
GFR, Estimated: 22 mL/min — ABNORMAL LOW (ref >60)
Glucose, Bld: 276 mg/dL — ABNORMAL HIGH (ref 70–99)
Potassium: 4.9 mmol/L (ref 3.5–5.1)
Sodium: 140 mmol/L (ref 135–145)
Total Bilirubin: 0.5 mg/dL (ref 0.3–1.2)
Total Protein: 7.4 g/dL (ref 6.5–8.1)

## 2022-03-26 LAB — TYPE AND SCREEN
ABO/RH(D): A POS
Antibody Screen: NEGATIVE

## 2022-03-26 LAB — PROCALCITONIN: Procalcitonin: <0.1 ng/mL

## 2022-03-26 LAB — HEMOGLOBIN AND HEMATOCRIT, BLOOD
HCT: 31.2 % — ABNORMAL LOW (ref 36.0–46.0)
Hemoglobin: 9.4 g/dL — ABNORMAL LOW (ref 12.0–15.0)

## 2022-03-26 LAB — BRAIN NATRIURETIC PEPTIDE: B Natriuretic Peptide: 132.6 pg/mL — ABNORMAL HIGH (ref 0.0–100.0)

## 2022-03-26 LAB — PROTIME-INR
INR: 2 — ABNORMAL HIGH (ref 0.8–1.2)
Prothrombin Time: 22.1 seconds — ABNORMAL HIGH (ref 11.4–15.2)

## 2022-03-26 LAB — TROPONIN I (HIGH SENSITIVITY)
Troponin I (High Sensitivity): 13 ng/L (ref <18)
Troponin I (High Sensitivity): 13 ng/L (ref <18)

## 2022-03-26 LAB — MRSA NEXT GEN BY PCR, NASAL: MRSA by PCR Next Gen: NOT DETECTED

## 2022-03-26 LAB — OCCULT BLOOD X 1 CARD TO LAB, STOOL: Fecal Occult Bld: NEGATIVE

## 2022-03-26 MED ORDER — FENTANYL CITRATE PF 50 MCG/ML IJ SOSY
25.0000 ug | PREFILLED_SYRINGE | INTRAMUSCULAR | Status: DC | PRN
  Administered 2022-03-27 – 2022-03-28 (×5): 25 ug via INTRAVENOUS
  Filled 2022-03-26 (×5): qty 1

## 2022-03-26 MED ORDER — MOMETASONE FURO-FORMOTEROL FUM 200-5 MCG/ACT IN AERO
2.0000 | INHALATION_SPRAY | Freq: Two times a day (BID) | RESPIRATORY_TRACT | Status: DC
  Administered 2022-03-27 – 2022-03-29 (×5): 2 via RESPIRATORY_TRACT
  Filled 2022-03-26: qty 8.8

## 2022-03-26 MED ORDER — FLUTICASONE PROPIONATE 50 MCG/ACT NA SUSP
1.0000 | Freq: Every day | NASAL | Status: DC
  Administered 2022-03-27 – 2022-03-28 (×2): 1 via NASAL
  Filled 2022-03-26: qty 16

## 2022-03-26 MED ORDER — PANTOPRAZOLE SODIUM 40 MG IV SOLR
40.0000 mg | Freq: Two times a day (BID) | INTRAVENOUS | Status: DC
  Administered 2022-03-27 – 2022-03-28 (×4): 40 mg via INTRAVENOUS
  Filled 2022-03-26 (×4): qty 10

## 2022-03-26 MED ORDER — ACETAMINOPHEN 650 MG RE SUPP: 650.0000 mg | Freq: Four times a day (QID) | RECTAL | Status: DC | PRN

## 2022-03-26 MED ORDER — ORAL CARE MOUTH RINSE: 15.0000 mL | OROMUCOSAL | Status: DC | PRN

## 2022-03-26 MED ORDER — NALOXONE HCL 0.4 MG/ML IJ SOLN: 0.4000 mg | INTRAMUSCULAR | Status: DC | PRN

## 2022-03-26 MED ORDER — PANTOPRAZOLE SODIUM 40 MG IV SOLR
40.0000 mg | Freq: Once | INTRAVENOUS | Status: AC
  Administered 2022-03-26: 40 mg via INTRAVENOUS
  Filled 2022-03-26: qty 10

## 2022-03-26 MED ORDER — INSULIN ASPART 100 UNIT/ML IJ SOLN
0.0000 [IU] | Freq: Four times a day (QID) | INTRAMUSCULAR | Status: DC
  Administered 2022-03-27 – 2022-03-28 (×2): 2 [IU] via SUBCUTANEOUS
  Administered 2022-03-29: 1 [IU] via SUBCUTANEOUS

## 2022-03-26 MED ORDER — LORAZEPAM 2 MG/ML IJ SOLN
0.5000 mg | Freq: Two times a day (BID) | INTRAMUSCULAR | Status: DC | PRN
  Administered 2022-03-27: 0.5 mg via INTRAVENOUS
  Filled 2022-03-26: qty 1

## 2022-03-26 MED ORDER — ALBUTEROL SULFATE (2.5 MG/3ML) 0.083% IN NEBU: 2.5000 mg | INHALATION_SOLUTION | RESPIRATORY_TRACT | Status: DC | PRN

## 2022-03-26 MED ORDER — CHLORHEXIDINE GLUCONATE CLOTH 2 % EX PADS
6.0000 | MEDICATED_PAD | Freq: Every day | CUTANEOUS | Status: DC
  Administered 2022-03-26: 6 via TOPICAL

## 2022-03-26 MED ORDER — ANASTROZOLE 1 MG PO TABS
1.0000 mg | ORAL_TABLET | Freq: Every day | ORAL | Status: DC
  Administered 2022-03-27 – 2022-03-29 (×3): 1 mg via ORAL
  Filled 2022-03-26 (×3): qty 1

## 2022-03-26 MED ORDER — ACETAMINOPHEN 325 MG PO TABS: 650.0000 mg | ORAL_TABLET | Freq: Four times a day (QID) | ORAL | Status: DC | PRN

## 2022-03-26 MED ORDER — INSULIN GLARGINE-YFGN 100 UNIT/ML ~~LOC~~ SOLN
18.0000 [IU] | Freq: Every day | SUBCUTANEOUS | Status: DC
  Administered 2022-03-26 – 2022-03-28 (×3): 18 [IU] via SUBCUTANEOUS
  Filled 2022-03-26 (×4): qty 0.18

## 2022-03-26 MED ORDER — ONDANSETRON HCL 4 MG/2ML IJ SOLN
4.0000 mg | Freq: Four times a day (QID) | INTRAMUSCULAR | Status: DC | PRN
  Administered 2022-03-28: 4 mg via INTRAVENOUS
  Filled 2022-03-26: qty 2

## 2022-03-26 MED ORDER — SODIUM CHLORIDE 0.9 % IV BOLUS
500.0000 mL | Freq: Once | INTRAVENOUS | Status: AC
  Administered 2022-03-26: 500 mL via INTRAVENOUS

## 2022-03-26 NOTE — ED Notes (Signed)
[facility] @ [facility] will send transport. ABB (NS) 18:01

## 2022-03-26 NOTE — ED Notes (Signed)
Pt currently denies pain. Pt stated that she did have chest pain approx a week ago that was resolved with nitro. Pt has not had any chest pain since then.

## 2022-03-26 NOTE — ED Provider Notes (Signed)
[facility] DE[facility] Provider Note   CSN: [id number] Arrival date & time: 03/26/22  1329     History {Add pertinent medical, surgical, social history, OB history to HPI:1} Chief Complaint  Patient presents with   Melena    [name] is a 87 y.o. female.   Patient with a past medical history include remote history of DVT/PE, diastolic heart failure, chronic kidney disease, diabetes, etc. presents with right leg DVT. Presenting with dark stools for the past 2 days.  States she had 2 or 3 episodes of dark black stool at home it was soft.  No bright red blood per rectum and no blood with wiping.  She is on Eliquis for the past 2 weeks after being diagnosed with a DVT in her right leg.  Has had increased shortness of breath as well as pleuritic chest pain to the right side for the past 2 days as well.  Stool today was dark but she did not see any bright red blood and not seeing blood in the toilet bowl.  Has had increased dizziness and lightheadedness as well with difficulty breathing and right-sided chest pain worse with inspiration.  No previous EGD or colonoscopy.  The history is provided by the patient.       Home Medications Prior to Admission medications   Medication Sig Start Date End Date Taking? Authorizing Provider  albuterol (PROAIR HFA) 108 (90 Base) MCG/ACT inhaler Inhale 1-2 puffs into the lungs every 6 (six) hours as needed for wheezing or shortness of breath. 12/19/18   [name], NP  ALPRAZolam Duanne Moron) 0.5 MG tablet Take 1 tablet (0.5 mg total) by mouth 3 (three) times daily as needed (vertigo). 02/23/22   Bonnita Hollow, MD  anastrozole (ARIMIDEX) 1 MG tablet TAKE 1 TABLET BY MOUTH EVERY DAY 02/27/22   [name], MD  APIXABAN Arne Cleveland) VTE STARTER PACK ('10MG'$  AND '5MG'$ ) Take as directed on package: start with 2 (two)-'5mg'$  tablets twice daily for 7 days. On day 8, switch to 1 (one)-'5mg'$  tablet twice daily. 03/16/22   Sherwood Gambler, MD  atorvastatin (LIPITOR) 80 MG tablet TAKE 1 TABLET BY MOUTH EVERY DAY 01/11/22   Bonnita Hollow, MD  budesonide-formoterol [facility]) 160-4.5 MCG/ACT inhaler Inhale 2 puffs into the lungs 2 (two) times daily. 01/26/21   [name], NP  carvedilol (COREG) 12.5 MG tablet TAKE 1 TABLET BY MOUTH 2 TIMES DAILY 01/11/22   Bonnita Hollow, MD  cetirizine (ZYRTEC) 10 MG tablet Take 10 mg by mouth daily as needed for allergies or rhinitis.    [provider]  clopidogrel (PLAVIX) 75 MG tablet Take 1 tablet (75 mg total) by mouth daily. 05/26/21   Freada Bergeron, MD  Continuous Blood Gluc Sensor (FREESTYLE LIBRE 14 DAY SENSOR) MISC PLACE 1 DEVICE ON THE SKIN AS DIRECTED EVERY 14 DAYS 02/27/22   Shamleffer, [name], MD  diclofenac Sodium (VOLTAREN) 1 % GEL Apply 2 g topically 4 (four) times daily. 02/23/22   Bonnita Hollow, MD  escitalopram (LEXAPRO) 20 MG tablet Take 0.5 tablets (10 mg total) by mouth daily for 7 days. Then stop 02/16/22 02/23/22  Reed, Tiffany L, DO  febuxostat (ULORIC) 40 MG tablet TAKE 1 TABLET BY MOUTH EVERY DAY 10/12/21   [name], NP  fluticasone (FLONASE) 50 MCG/ACT nasal spray Place 1 spray into both nostrils in the morning and at bedtime. 12/17/20   Ngetich, Nelda Bucks, NP  folic acid (FOLVITE) 1  MG tablet TAKE 1 TABLET BY MOUTH EVERY DAY 01/11/22   Bonnita Hollow, MD  guaifenesin (ROBITUSSIN) 100 MG/5ML syrup Take 200 mg by mouth 3 (three) times daily as needed for cough.    [provider]  insulin aspart (NOVOLOG FLEXPEN) 100 UNIT/ML FlexPen Inject 14 Units into the skin 3 (three) times daily before meals.    [provider]  insulin glargine, 2 Unit Dial, (TOUJEO MAX SOLOSTAR) 300 UNIT/ML Solostar Pen Inject 42 Units into the skin daily. Patient assistance program provides 05/24/21   Shamleffer, [name], MD  linaclotide [facility]) 145 MCG CAPS capsule Take 1 capsule (145 mcg total) by mouth daily before  breakfast. 09/05/21   [name], NP  meclizine (ANTIVERT) 25 MG tablet TAKE 1 TABLET BY MOUTH THREE TIMES DAILY AS NEEDED FOR DIZZINESS 10/11/21   [name], NP  memantine (NAMENDA) 5 MG tablet TAKE 2 TABLETS BY MOUTH EVERY DAY and TAKE 1 TABLET BY MOUTH EVERY EVENING 09/26/21   [name], NP  nitroGLYCERIN (NITROSTAT) 0.4 MG SL tablet Take one tablet under the tongue every 5 minutes as needed for chest pain 09/24/13   [name], NP  nystatin cream (MYCOSTATIN) Apply 1 Application topically 2 (two) times daily. 02/16/22   Reed, Tiffany L, DO  ondansetron (ZOFRAN) 4 MG tablet Take 1 tablet (4 mg total) by mouth every 8 (eight) hours as needed for nausea or vomiting. 03/14/22   Bonnita Hollow, MD  OVER THE COUNTER MEDICATION Take 1 capsule by mouth every other day. Over the counter stool softener - unknown type    [provider]  oxyCODONE-acetaminophen (PERCOCET) 7.5-325 MG tablet Take 1 tablet by mouth every 12 (twelve) hours. 04/30/22 05/30/22  Gillis Santa, MD  oxyCODONE-acetaminophen (PERCOCET) 7.5-325 MG tablet Take 1 tablet by mouth every 12 (twelve) hours. 05/30/22 06/29/22  Gillis Santa, MD  pantoprazole (PROTONIX) 20 MG tablet TAKE 1 TABLET BY MOUTH EVERY DAY 12/26/21   Bonnita Hollow, MD  sodium polystyrene (SPS) 15 GM/60ML suspension take 47ms by MOUTH ONCE WEEKLY. TO keep potassium down 12/06/21   TBonnita Hollow MD  torsemide (DEMADEX) 20 MG tablet TAKE 1 TABLET BY MOUTH EVERY DAY Take an extra dose for weight gain, 3lb in 1 day or 5lbs in 1 week 02/27/22   TBonnita Hollow MD  traZODone (DESYREL) 50 MG tablet Take 0.5 tablets (25 mg total) by mouth at bedtime. 02/16/22   Reed, Tiffany L, DO  Vitamin D, Ergocalciferol, (DRISDOL) 1.25 MG (50000 UNIT) CAPS capsule TAKE 1 CAPSULE BY MOUTH every 7 days 02/09/22   KBrunetta Genera MD      Allergies    Sulfonamide derivatives, Lokelma [sodium zirconium cyclosilicate], Aricept [donepezil hcl],  and Tramadol    Review of Systems   Review of Systems  Constitutional:  Negative for activity change, appetite change and fatigue.  HENT:  Negative for congestion.   Respiratory:  Positive for shortness of breath. Negative for cough and chest tightness.   Cardiovascular:  Positive for chest pain.  Gastrointestinal:  Positive for abdominal pain. Negative for nausea and vomiting.  Genitourinary:  Negative for dysuria.  Musculoskeletal:  Negative for arthralgias and myalgias.  Skin:  Negative for rash.  Neurological:  Positive for weakness. Negative for dizziness and headaches.    all other systems are negative except as noted in the HPI and PMH.   Physical Exam Updated Vital Signs BP (!) 127/48 (BP Location: Right Arm)  Pulse 68   Temp (!) 97.2 F (36.2 C) (Temporal)   Resp 18   Ht '5\' 7"'$  (1.702 m)   Wt 133.8 kg   SpO2 98%   BMI 46.20 kg/m  Physical Exam Vitals and nursing note reviewed.  Constitutional:      General: She is not in acute distress.    Appearance: She is well-developed.     Comments: Pale appearing  HENT:     Head: Normocephalic and atraumatic.     Mouth/Throat:     Pharynx: No oropharyngeal exudate.  Eyes:     Conjunctiva/sclera: Conjunctivae normal.     Pupils: Pupils are equal, round, and reactive to light.  Neck:     Comments: No meningismus. Cardiovascular:     Rate and Rhythm: Normal rate and regular rhythm.     Heart sounds: Normal heart sounds. No murmur heard. Pulmonary:     Effort: Pulmonary effort is normal. No respiratory distress.     Breath sounds: Normal breath sounds.  Abdominal:     Palpations: Abdomen is soft.     Tenderness: There is no abdominal tenderness. There is no guarding or rebound.  Genitourinary:    Comments: Chaperone present, Airline pilot.  Dark stool on rectal exam without gross blood.  No hemorrhoids or fissures Musculoskeletal:        General: No tenderness. Normal range of motion.     Cervical back: Normal range of  motion and neck supple.  Skin:    General: Skin is warm.     Capillary Refill: Capillary refill takes less than 2 seconds.     Findings: No rash.  Neurological:     General: No focal deficit present.     Mental Status: She is alert and oriented to person, place, and time. Mental status is at baseline.     Cranial Nerves: No cranial nerve deficit.     Motor: No abnormal muscle tone.     Coordination: Coordination normal.     Comments:  5/5 strength throughout. CN 2-12 intact.Equal grip strength.   Psychiatric:        Behavior: Behavior normal.     ED Results / Procedures / Treatments   Labs (all labs ordered are listed, but only abnormal results are displayed) Labs Reviewed  COMPREHENSIVE METABOLIC PANEL - Abnormal; Notable for the following components:      Result Value   Glucose, Bld 276 (*)    BUN 52 (*)    Creatinine, Ser 2.13 (*)    Calcium 8.8 (*)    AST 11 (*)    GFR, Estimated 22 (*)    All other components within normal limits  CBC - Abnormal; Notable for the following components:   RBC 3.32 (*)    Hemoglobin 9.5 (*)    HCT 31.4 (*)    All other components within normal limits  OCCULT BLOOD X 1 CARD TO LAB, STOOL  PROTIME-INR  TROPONIN I (HIGH SENSITIVITY)    EKG None  Radiology No results found.  Procedures Procedures  {Document cardiac monitor, telemetry assessment procedure when appropriate:1}  Medications Ordered in ED Medications - No data to display  ED Course/ Medical Decision Making/ A&P                           Medical Decision Making Amount and/or Complexity of Data Reviewed Labs: ordered. Decision-making details documented in ED Course. Radiology: ordered and independent interpretation performed. Decision-making details documented  in ED Course. ECG/medicine tests: ordered and independent interpretation performed. Decision-making details documented in ED Course.  Risk Prescription drug management. Decision regarding  hospitalization.   Dark stools for the past 2 days.  List and started on Eliquis.  Vital stable, no distress.  Has had increased shortness of breath and right-sided pleuritic chest pain for the past 2 days as well.  Hemoglobin is at baseline.  Creatinine is at baseline  Is negative, sample showed brown stool.  Patient complaining of pleuritic chest pain as well as difficulty breathing.  She is not hypoxic.  He is having some blood pressures in the 034V to 425Z systolic.  Blood pressure in the 80 range appears to be spurious.  Unclear if she is having a true GI bleed.  However given her recent large DVT and concern for new onset PE will plan observation admission for monitoring.  Cannot give IV contrast due to kidney function that is her baseline.  She has increasing chest pain as well as shortness of breath but no change in EKG or chest x-ray.  Results reviewed and interpreted by me.  EGD in 2018 showed evidence of gastritis.  No recent colonoscopy.  Patient given gentle IV fluids and IV Protonix.  May benefit from VQ scan when she arrives at the hospital.  Continue to monitor hemoglobin overnight.  Admission discussed with Dr. Posey Pronto. {Document critical care time when appropriate:1} {Document review of labs and clinical decision tools ie heart score, Chads2Vasc2 etc:1}  {Document your independent review of radiology images, and any outside records:1} {Document your discussion with family members, caretakers, and with consultants:1} {Document social determinants of health affecting pt's care:1} {Document your decision making why or why not admission, treatments were needed:1} Final Clinical Impression(s) / ED Diagnoses Final diagnoses:  None    Rx / DC Orders ED Discharge Orders     None

## 2022-03-26 NOTE — H&P (Signed)
History and Physical      [name] TDV:[id number] DOB: 05-16-1934 DOA: 03/26/2022  PCP: Bonnita Hollow, MD  Patient coming from: home   I have personally briefly reviewed patient's old medical records in [place]  Chief Complaint: Dark-colored stool  HPI: [name] is a 87 y.o. female with medical history significant for recently diagnosed acute right lower extremity DVT, chronic diastolic heart failure, type 2 diabetes mellitus, CKD stage IIIb associated with baseline creatinine range 2-2.3, essential pretension, COPD, anemia of chronic kidney disease with baseline hemoglobin range 8.5-9.5, CAD status post PCI with stents placed in October 2021, who is admitted to [facility] on 03/26/2022 by way of transfer from [facility] emergency department for further evaluation of pleuritic right-sided chest pain after presenting from home to the latter facility complaining of dark-colored stool.   The patient was recently diagnosed with acute right lower extremity DVT on 03/16/2022 via outpatient venous ultrasound performed at that time, which showed acute DVT of the right femoral vein as well as acute DVT of the right popliteal vein, and no evidence of acute DVT in the left lower extremity.  Based upon the results of this ultrasound, the patient was sent to the emergency department before being discharged home from the ED on Eliquis, with which she reports good interval compliance.  In the setting of PCI with stents placed in October 2021, the patient been on dual antiplatelet therapy with aspirin and Plavix in the interim since that procedure.  At the time of recently initiated Eliquis, her aspirin was held, Plavix was continued.  She was evaluated as an outpatient by Dr.**Of vascular surgery on 03/21/2022, at which time we will plan for IV with vascular surgery standpoint was to continue 3 to 6 months of anticoagulation, without plan for surgical intervention.   Starting  on 03/26/2019, the patient reports noting a total of 23 episodes recorded stool over the ensuing 2 days, with most recent such episode occurring midday on 03/26/2022.  This is not associated with any bright red blood, nor any significant abdominal discomfort, nausea, vomiting, hematemesis.  In light of the above, she contacted vascular surgery on 03/25/2022, who recommended that she hold her Eliquis for 1 day starting on 03/26/2022.  She has complied with this recommendation, noting most recent dose of Eliquis occurring on the evening of 03/25/2022.  After experiencing a another episode of dark-colored stool midday on 03/26/2022, the patient subsequently presented to [facility] emergency department for further evaluation and management thereof.   She reportedly underwent an EGD in 2018, which showed evidence of gastritis will which appears most recent EGD.  It is unclear at this time the timing of her most recent prior colonoscopy.  No known history of underlying liver disease.  No history of alcohol abuse.  Per chart review, she has a history of anemia of chronic kidney disease associated with baseline hemoglobin range of 8.5-9.5.  Most recent prior hemoglobin data point was noted to be 9.4 on 03/14/2022. Past surgical hx notable for gastric bypass in 1977.  She also notes 2 days of intermittent sharp left-sided chest discomfort, that worsens with deep inspiration.  This has been associated with intermittent shortness of breath, in the absence of any orthopnea, PND, or worsening peripheral edema.  She denies any left-sided involvement regarding her chest discomfort, and reports that the right-sided chest discomfort is nonexertional, nonpositional in nature.  Denies any associated palpitations or diaphoresis, nausea, vomiting.  Denies any recent trauma.  Denies any associated subjective fever, chills, rigors, generalized myalgias.  No hemoptysis, wheezing.   Per chart review, it appears that she was  experiencing some chest discomfort during the first week of October 2023, prompting VQ scan on 02/10/22, which showed no evidence of acute pulmonary embolism.   In terms of her prior thromboembolic history, per chart review, there is documentation of a history of acute DVT/pulmonary embolism in September 2013 followed by a documented history of acute pulmonary embolism in December 2015.  It appears that the plan following the December 2015 clot was to initiate lifelong anticoagulation however it appears that this plan was modified after initiation of dual antiplatelet therapy in October 2021 following PCI with stent placement at that time.      [facility] ED Course:  Vital signs in the ED were notable for the following: Afebrile; heart rate 75-88; systolic blood pressures in the low 100s to 120s; respiratory rate 16-22, oxygen saturation 96 to 100% room air.  DRE performed by EDP showed brown-colored stool, fecal occult blood test negative.  Labs were notable for the following: CMP was notable for the following: Bicarbonate 27, anion gap 11, BUN 52 compared to 60 on 03/14/2022, creatinine 2.13 compared to 2.19 on 03/14/2022, glucose 276, calcium, adjusted for mild hypoalbuminemia noted to be 9.2, avidin 3.5 liver enzymes within normal limits.  High-sensitivity troponin x2 are both out of be 13.  CBC notable for the following: Blood cell count 9600, hemoglobin 9.5, with repeat H and H performed few hours later noted to be 9.4, with associated normocytic/normochromic properties as well as nonelevated RDW, platelet count 264.  Per my interpretation, EKG in ED demonstrated the following:  His recent prior EKG from 02/08/2022 today's EKG shows sinus rhythm with first-degree AV block, heart rate 60, PR interval 218, nonspecific T wave inversion in aVL, which appears unchanged from most recent prior EKG, and no evidence of ST changes, including no evidence of ST elevation.  Imaging and additional notable ED  work-up: Chest Rane Dumm, per radiology read, shows no evidence of acute cardiopulmonary process, including no evidence of infiltrate, edema, effusion, or pneumothorax.  CT abdomen/pelvis without contrast, comparison to imaging from 02/08/2022 shows punctate focus of air in nondependent bladder consistent with recent instrumentation, will cholelithiasis without evidence of acute cholecystitis.  CT abdomen/pelvis today also shows interval resolution of previously noted pneumobilia and demonstrates minimal sigmoid diverticulosis in the absence of evidence of diverticulitis, bowel obstruction, abscess, or perforation.  While in the ED, the following were administered: Protonix 40 mg IV x1, normal saline x500 cc bolus.  Subsequently, the patient was admitted for overnight observation to [facility] for further evaluation and management of presenting atypical right-sided chest discomfort as well as further evaluation for potential acute GI bleed per patient patient report recent development of dark-colored stool.    Review of Systems: As per HPI otherwise 10 point review of systems negative.   Past Medical History:  Diagnosis Date   Abdominal pain 01/26/2012   Acute kidney injury superimposed on chronic kidney disease ([place]) 12/18/2014   Allergy    takes Mucinex daily as needed   Anemia, unspecified    Anxiety    takes Clonazepam daily as needed   Anxiety associated with depression 06/08/2017   Arthritis    Breast cancer ([place])    Breast cancer screening 02/19/2014   Breast mass 10/27/2014   Chronic diastolic CHF (congestive heart failure) ([place])    takes Furosemide daily   Chronic pain syndrome  02/06/2015   CKD (chronic kidney disease), stage III ([place])    Coronary artery disease    a. s/p IMI 2004 tx with BMS to RCA;  b. s/p Promus DES to RCA 2/12 c. abnormal nuc 2016 -> cath with med rx. d. NSTEMI 02/2020  mv CAD with severe ISR in pRCA and m/dRCA lesion both treated with DES.   Debility 08/01/2012    Dementia ([place])    Depression    takes Cymbalta daily   Diabetes mellitus    insulin daily   DOE (dyspnea on exertion) 06/24/2014   Dysphagia, unspecified(787.20) 07/31/2012   Fungal dermatitis 11/13/2007   Qualifier: Diagnosis of  By: Burnice Logan  MD, Doretha Sou    GERD (gastroesophageal reflux disease)    takes Protonix daily   Gout, unspecified    Headache    occasionally   History of blood clots    History of deep venous thrombosis 01/27/2012   History of pulmonary embolus (PE) 04/22/2014   Hyperkalemia 07/26/2017   Hyperlipidemia    takes Pravastatin daily   Hypertension    Insomnia    Joint pain    Joint swelling    Morbid obesity ([place])    Multiple benign nevi 11/15/2016   Myocardial infarction ([place]) 2004   Non-STEMI (non-ST elevated myocardial infarction) ([place]) 02/15/2020   Numbness    Obstructive sleep apnea    does not wear cpap   Osteoarthritis    Osteoarthritis    Osteoarthrosis, unspecified whether generalized or localized, lower leg    Pain in the chest 12/31/2013   Pain, chronic    Personal history of other diseases of the digestive system 12/03/2006   Qualifier: Diagnosis of  By: Burnice Logan  MD, Doretha Sou    Polymyalgia rheumatica [facility])    Presence of stent in right coronary artery    Pulmonary emboli ([place]) 01/2012   felt to need lifelong anticoagulation but Xarelto dc in 02/2020 due to need for DAPT   Situational depression 06/04/2009   Qualifier: Diagnosis of  By: Burnice Logan  MD, Doretha Sou    Syncope, vasovagal 07/04/2021   Urinary incontinence    takes Linzess daily   Vaginitis and vulvovaginitis 06/23/2016   Vertigo    hx of;was taking Meclizine if needed   Wears dentures     Past Surgical History:  Procedure Laterality Date   ABDOMINAL HYSTERECTOMY     partial   APPENDECTOMY     blood clots/legs and lungs  2013   BREAST BIOPSY Left 07/22/2014   BREAST BIOPSY Left 02/10/2013   BREAST LUMPECTOMY Left 11/05/2014   BREAST LUMPECTOMY WITH RADIOACTIVE SEED  LOCALIZATION Left 11/05/2014   Procedure: LEFT BREAST LUMPECTOMY WITH RADIOACTIVE SEED LOCALIZATION;  Surgeon: [name], MD;  Location: [place];  Service: General;  Laterality: Left;   CARDIAC CATHETERIZATION     COLONOSCOPY     CORONARY ANGIOPLASTY  2   CORONARY STENT INTERVENTION N/A 02/17/2020   Procedure: CORONARY STENT INTERVENTION;  Surgeon: Leonie Man, MD;  Location: [place] CV LAB;  Service: Cardiovascular;  Laterality: N/A;   ESOPHAGOGASTRODUODENOSCOPY (EGD) WITH PROPOFOL N/A 11/07/2016   Procedure: ESOPHAGOGASTRODUODENOSCOPY (EGD) WITH PROPOFOL;  Surgeon: Gatha Mayer, MD;  Location: [facility];  Service: Endoscopy;  Laterality: N/A;   EXCISION OF SKIN TAG Right 11/05/2014   Procedure: EXCISION OF RIGHT EYELID SKIN TAG;  Surgeon: [name], MD;  Location: [place];  Service: General;  Laterality: Right;   EYE SURGERY Bilateral    cataract  GASTRIC BYPASS  1977    reversed in 1979, [place] CATH AND CORONARY ANGIOGRAPHY N/A 02/17/2020   Procedure: LEFT HEART CATH AND CORONARY ANGIOGRAPHY;  Surgeon: Leonie Man, MD;  Location: [place] CV LAB;  Service: Cardiovascular;  Laterality: N/A;   LEFT HEART CATHETERIZATION WITH CORONARY ANGIOGRAM N/A 06/29/2014   Procedure: LEFT HEART CATHETERIZATION WITH CORONARY ANGIOGRAM;  Surgeon: Troy Sine, MD;  Location: [facility] CATH LAB;  Service: Cardiovascular;  Laterality: N/A;   MEMBRANE PEEL Right 10/23/2018   Procedure: MEMBRANE PEEL;  Surgeon: Hurman Horn, MD;  Location: [place];  Service: Ophthalmology;  Laterality: Right;   MI with stent placement  2004   PARS PLANA VITRECTOMY Right 10/23/2018   Procedure: PARS PLANA VITRECTOMY WITH 25 GAUGE;  Surgeon: Hurman Horn, MD;  Location: [place];  Service: Ophthalmology;  Laterality: Right;    Social History:  reports that she has never smoked. She has been exposed to tobacco smoke. She has never used smokeless tobacco. She reports that she does not  drink alcohol and does not use drugs.   Allergies  Allergen Reactions   Sulfonamide Derivatives Swelling and Other (See Comments)    Mouth swelling- no resp issues noted   Lokelma [Sodium Zirconium Cyclosilicate] Other (See Comments)    Headache, stomach upset   Aricept [Donepezil Hcl] Diarrhea and Nausea And Vomiting    GI upset/loose stools   Tramadol Nausea And Vomiting    Family History  Problem Relation Age of Onset   Breast cancer Mother 67   Heart disease Mother    Throat cancer Father    Hypertension Father    Arthritis Father    Diabetes Father    Arthritis Sister    Obesity Sister    Diabetes Sister    Heart disease Cousin    Colon cancer Neg Hx    Stomach cancer Neg Hx    Esophageal cancer Neg Hx     Family history reviewed and not pertinent    Prior to Admission medications   Medication Sig Start Date End Date Taking? Authorizing Provider  albuterol (PROAIR HFA) 108 (90 Base) MCG/ACT inhaler Inhale 1-2 puffs into the lungs every 6 (six) hours as needed for wheezing or shortness of breath. 12/19/18   [name], NP  ALPRAZolam Duanne Moron) 0.5 MG tablet Take 1 tablet (0.5 mg total) by mouth 3 (three) times daily as needed (vertigo). 02/23/22   Bonnita Hollow, MD  anastrozole (ARIMIDEX) 1 MG tablet TAKE 1 TABLET BY MOUTH EVERY DAY 02/27/22   [name], MD  APIXABAN Arne [place]) VTE STARTER PACK ('10MG'$  AND '5MG'$ ) Take as directed on package: start with 2 (two)-'5mg'$  tablets twice daily for 7 days. On day 8, switch to 1 (one)-'5mg'$  tablet twice daily. 03/16/22   Sherwood Gambler, MD  atorvastatin (LIPITOR) 80 MG tablet TAKE 1 TABLET BY MOUTH EVERY DAY 01/11/22   Bonnita Hollow, MD  budesonide-formoterol [facility]) 160-4.5 MCG/ACT inhaler Inhale 2 puffs into the lungs 2 (two) times daily. 01/26/21   [name], NP  carvedilol (COREG) 12.5 MG tablet TAKE 1 TABLET BY MOUTH 2 TIMES DAILY 01/11/22   Bonnita Hollow, MD  cetirizine (ZYRTEC) 10 MG tablet Take  10 mg by mouth daily as needed for allergies or rhinitis.    [provider]  clopidogrel (PLAVIX) 75 MG tablet Take 1 tablet (75 mg total) by mouth daily. 05/26/21   Freada Bergeron, MD  Continuous  Blood Gluc Sensor (FREESTYLE LIBRE 14 DAY SENSOR) MISC PLACE 1 DEVICE ON THE SKIN AS DIRECTED EVERY 14 DAYS 02/27/22   Shamleffer, [name], MD  diclofenac Sodium (VOLTAREN) 1 % GEL Apply 2 g topically 4 (four) times daily. 02/23/22   Bonnita Hollow, MD  escitalopram (LEXAPRO) 20 MG tablet Take 0.5 tablets (10 mg total) by mouth daily for 7 days. Then stop 02/16/22 02/23/22  Reed, Tiffany L, DO  febuxostat (ULORIC) 40 MG tablet TAKE 1 TABLET BY MOUTH EVERY DAY 10/12/21   [name], NP  fluticasone (FLONASE) 50 MCG/ACT nasal spray Place 1 spray into both nostrils in the morning and at bedtime. 12/17/20   Ngetich, Dinah C, NP  folic acid (FOLVITE) 1 MG tablet TAKE 1 TABLET BY MOUTH EVERY DAY 01/11/22   Bonnita Hollow, MD  guaifenesin (ROBITUSSIN) 100 MG/5ML syrup Take 200 mg by mouth 3 (three) times daily as needed for cough.    [provider]  insulin aspart (NOVOLOG FLEXPEN) 100 UNIT/ML FlexPen Inject 14 Units into the skin 3 (three) times daily before meals.    [provider]  insulin glargine, 2 Unit Dial, (TOUJEO MAX SOLOSTAR) 300 UNIT/ML Solostar Pen Inject 42 Units into the skin daily. Patient assistance program provides 05/24/21   Shamleffer, [name], MD  linaclotide [facility]) 145 MCG CAPS capsule Take 1 capsule (145 mcg total) by mouth daily before breakfast. 09/05/21   [name], NP  meclizine (ANTIVERT) 25 MG tablet TAKE 1 TABLET BY MOUTH THREE TIMES DAILY AS NEEDED FOR DIZZINESS 10/11/21   [name], NP  memantine (NAMENDA) 5 MG tablet TAKE 2 TABLETS BY MOUTH EVERY DAY and TAKE 1 TABLET BY MOUTH EVERY EVENING 09/26/21   [name], NP  nitroGLYCERIN (NITROSTAT) 0.4 MG SL tablet Take one tablet under the tongue every 5  minutes as needed for chest pain 09/24/13   [name], NP  nystatin cream (MYCOSTATIN) Apply 1 Application topically 2 (two) times daily. 02/16/22   Reed, Tiffany L, DO  ondansetron (ZOFRAN) 4 MG tablet Take 1 tablet (4 mg total) by mouth every 8 (eight) hours as needed for nausea or vomiting. 03/14/22   Bonnita Hollow, MD  OVER THE COUNTER MEDICATION Take 1 capsule by mouth every other day. Over the counter stool softener - unknown type    [provider]  oxyCODONE-acetaminophen (PERCOCET) 7.5-325 MG tablet Take 1 tablet by mouth every 12 (twelve) hours. 04/30/22 05/30/22  Gillis Santa, MD  oxyCODONE-acetaminophen (PERCOCET) 7.5-325 MG tablet Take 1 tablet by mouth every 12 (twelve) hours. 05/30/22 06/29/22  Gillis Santa, MD  pantoprazole (PROTONIX) 20 MG tablet TAKE 1 TABLET BY MOUTH EVERY DAY 12/26/21   Bonnita Hollow, MD  sodium polystyrene (SPS) 15 GM/60ML suspension take 44ms by MOUTH ONCE WEEKLY. TO keep potassium down 12/06/21   TBonnita Hollow MD  torsemide (DEMADEX) 20 MG tablet TAKE 1 TABLET BY MOUTH EVERY DAY Take an extra dose for weight gain, 3lb in 1 day or 5lbs in 1 week 02/27/22   TBonnita Hollow MD  traZODone (DESYREL) 50 MG tablet Take 0.5 tablets (25 mg total) by mouth at bedtime. 02/16/22   Reed, Tiffany L, DO  Vitamin D, Ergocalciferol, (DRISDOL) 1.25 MG (50000 UNIT) CAPS capsule TAKE 1 CAPSULE BY MOUTH every 7 days 02/09/22   KBrunetta Genera MD     Objective    Physical Exam: Vitals:   03/26/22 1819 03/26/22 1823 03/26/22 1929 03/26/22  2000  BP:   (!) 129/41 112/62  Pulse: (!) 59 60 (!) 56 67  Resp: '12 19 19 20  '$ Temp:   (!) 97.5 F (36.4 C)   TempSrc:   Oral   SpO2: 100% 100% 99% 96%  Weight:      Height:        General: appears to be stated age; alert Skin: warm, dry, no rash Head:  AT/[place] Mouth:  Oral mucosa membranes appear moist, normal dentition Neck: supple; trachea midline Heart:  RRR; did not appreciate any M/R/G Lungs:  CTAB, did not appreciate any wheezes, rales, or rhonchi Abdomen: + BS; soft, ND, NT Vascular: 2+ pedal pulses b/l; 2+ radial pulses b/l Extremities: no peripheral edema, no muscle wasting Neuro: strength and sensation intact in upper and lower extremities b/l     Labs on Admission: I have personally reviewed following labs and imaging studies  CBC: Recent Labs  Lab 03/26/22 1340 03/26/22 1815  WBC 9.6  --   HGB 9.5* 9.4*  HCT 31.4* 31.2*  MCV 94.6  --   PLT 264  --    Basic Metabolic Panel: Recent Labs  Lab 03/26/22 1340  NA 140  K 4.9  CL 102  CO2 27  GLUCOSE 276*  BUN 52*  CREATININE 2.13*  CALCIUM 8.8*   GFR: Estimated Creatinine Clearance: 27.6 mL/min (A) (by C-G formula based on SCr of 2.13 mg/dL (H)). Liver Function Tests: Recent Labs  Lab 03/26/22 1340  AST 11*  ALT 8  ALKPHOS 81  BILITOT 0.5  PROT 7.4  ALBUMIN 3.5   No results for input(s): "LIPASE", "AMYLASE" in the last 168 hours. No results for input(s): "AMMONIA" in the last 168 hours. Coagulation Profile: Recent Labs  Lab 03/26/22 1343  INR 2.0*   Cardiac Enzymes: No results for input(s): "CKTOTAL", "CKMB", "CKMBINDEX", "TROPONINI" in the last 168 hours. BNP (last 3 results) No results for input(s): "PROBNP" in the last 8760 hours. HbA1C: No results for input(s): "HGBA1C" in the last 72 hours. CBG: No results for input(s): "GLUCAP" in the last 168 hours. Lipid Profile: No results for input(s): "CHOL", "HDL", "LDLCALC", "TRIG", "CHOLHDL", "LDLDIRECT" in the last 72 hours. Thyroid Function Tests: No results for input(s): "TSH", "T4TOTAL", "FREET4", "T3FREE", "THYROIDAB" in the last 72 hours. Anemia Panel: No results for input(s): "VITAMINB12", "FOLATE", "FERRITIN", "TIBC", "IRON", "RETICCTPCT" in the last 72 hours. Urine analysis:    Component Value Date/Time   COLORURINE STRAW (A) 02/08/2022 1656   APPEARANCEUR CLEAR 02/08/2022 1656   LABSPEC 1.010 02/08/2022 1656   PHURINE 5.5  02/08/2022 1656   GLUCOSEU NEGATIVE 02/08/2022 1656   HGBUR NEGATIVE 02/08/2022 1656   BILIRUBINUR NEGATIVE 02/08/2022 1656   BILIRUBINUR neg 06/03/2019 1116   KETONESUR NEGATIVE 02/08/2022 1656   PROTEINUR NEGATIVE 02/08/2022 1656   UROBILINOGEN 0.2 06/03/2019 1116   UROBILINOGEN 0.2 02/16/2018 1514   NITRITE NEGATIVE 02/08/2022 1656   LEUKOCYTESUR SMALL (A) 02/08/2022 1656    Radiological Exams on Admission: CT ABDOMEN PELVIS WO CONTRAST  Result Date: 03/26/2022 CLINICAL DATA:  Acute abdominal pain. EXAM: CT ABDOMEN AND PELVIS WITHOUT CONTRAST TECHNIQUE: Multidetector CT imaging of the abdomen and pelvis was performed following the standard protocol without IV contrast. RADIATION DOSE REDUCTION: This exam was performed according to the departmental dose-optimization program which includes automated exposure control, adjustment of the mA and/or kV according to patient size and/or use of iterative reconstruction technique. COMPARISON:  02/08/2022 FINDINGS: Lower chest: Clear lung bases. Stents of coronary artery calcifications  or stents. Hepatobiliary: No focal liver abnormality on this unenhanced exam. Small gallstones without pericholecystic inflammation. The previous pneumobilia is no longer seen. The previous air in the gallbladder is not seen. No biliary dilatation. No visualized choledocholithiasis. Pancreas: No ductal dilatation or inflammation. Spleen: Moderate lumbar degenerative change. Adrenals/Urinary Tract: No adrenal nodule. Bilateral renal cysts. No imaging follow-up is recommended. No hydronephrosis or renal calculi. No suspicious renal lesion. Decompressed ureters without ureteral stones. Punctate focus of air in the nondependent bladder. Stomach/Bowel: Small hiatal hernia with surgical clips at the gastroesophageal junction. Enteric sutures within small bowel in the left abdomen. Small bowel adjacent to the sutures is patulous, however noninflamed. No obstruction. Appendectomy per  history. Small volume of colonic stool. Minimal sigmoid diverticulosis without diverticulitis. No colonic inflammation. Vascular/Lymphatic: Aortic and branch atherosclerosis. No aneurysm. No bulky adenopathy. Reproductive: Subtle uterine fibroids. No adnexal mass. Other: Postsurgical change of the anterior abdominal wall, diminutive fat containing umbilical hernia. No ascites or free air. Musculoskeletal: Lumbar and hip degenerative change. No acute osseous findings. IMPRESSION: 1. Punctate focus of air in the nondependent bladder, typically due to recent instrumentation, however can be seen with urinary tract infection. Recommend correlation with urinalysis. 2. Cholelithiasis without CT findings of acute cholecystitis. The previous pneumobilia and air in the gallbladder has resolved. 3. Minimal sigmoid diverticulosis without diverticulitis. Aortic Atherosclerosis (ICD10-I70.0). Electronically Signed   By: Keith Rake M.D.   On: 03/26/2022 17:48   DG Chest Portable 1 View  Result Date: 03/26/2022 CLINICAL DATA:  Recent diagnosis with DVT. EXAM: PORTABLE CHEST 1 VIEW COMPARISON:  February 08, 2022 FINDINGS: Mild cardiomegaly. The hila and mediastinum are unchanged. No pneumothorax. No nodules or masses. No focal infiltrates. IMPRESSION: No active disease. Electronically Signed   By: Dorise Bullion III M.D.   On: 03/26/2022 15:33      Assessment/Plan    Principal Problem:   Atypical chest pain Active Problems:   Essential hypertension   Chronic diastolic CHF (congestive heart failure) (HCC)   DM2 (diabetes mellitus, type 2) (HCC)   Melena   Acute deep vein thrombosis (DVT) of femoral vein of right lower extremity (HCC)   Stage 3b chronic kidney disease (CKD) (HCC)   COPD (chronic obstructive pulmonary disease) (HCC)   History of anemia due to chronic kidney disease   GAD (generalized anxiety disorder)   Allergic rhinitis     #) Atypical chest pain: Patient reports 2 days of  intermittent sharp right-sided chest pain appears pleuritic, worse with deep inspiration, and noted to be nonexertional.  No associated left-sided chest discomfort.  Appears atypical for ACS, while also noting troponin x2 along with presenting EKG that showed no evidence of acute ischemic changes, including no evidence of ST elevation.  Chest x-ray showed no evidence of acute cardiopulmonary process, including no evidence of pneumothorax.  However, presentation is concerning for potential acute pulmonary embolism given recent bleed diagnosed acute right lower extremity DVT as well as a documented history of multiple prior acute pulmonary emboli, as further described above.  It is noted that she was experiencing some chest discomfort during the first week of October 2023, prompting [place] Q scan on 02/10/2022 which showed no evidence of pulmonary embolism.  However, this was prior to considering diagnosis of acute right lower extremity DVT on 03/16/2022.  In the setting of the patient's chronic kidney disease, unable to pursue CTA chest to further evaluate for interval development of acute pulmonary embolism.  Consequently, we will pursue further evaluation via V/Q  scan, will also further evaluating for any evidence of acute gastrointestinal bleed, as further detailed below.    Presentation appears less suggestive of aortic dissection or esophageal rupture.  She appears hemodynamically stable, and without evidence of acute respiratory distress.  Differential for her presenting right-sided chest pain includes potential gastrointestinal sources, including a documented history of gastritis as well as GERD with differentials including possibility of biliary colic given CKD imaging demonstrates evidence of cholelithiasis without evidence of corresponding acute cholecystitis and no evidence of biliary obstruction.   Plan: Stat VQ scan, as above.  If this scan is suggestive of pulmonary embolism and repeat CBC ordered upon  arrival at [facility]  grossly stable hemoglobin, would plan to initiate heparin drip at that time.  Further trending of hemoglobin values for acute 4-hour H&H's ordered through 9 AM on 03/27/2022.  Add on procalcitonin level, BNP.  Check lipase.  Repeat CMP in the morning.  Prn IV fentanyl.  As needed Zofran.  Protonix 40 mg IV twice daily.           #) Melena: Per patient report, she is experienced to 3 episodes of dark-colored stool over the last 2 days.  Although it is unclear at this time if she is experiencing an acute GI bleed, noting that DRE performed earlier this evening showed brown-colored stool that was fecal occult blood negative.  Additionally, in the interval since starting Eliquis on 03/16/2022, the patient's BUN has trended down, also reassuring from the standpoint of evaluating potential acute upper gastrointestinal bleed additionally, hemoglobin remains at baseline, stable, without any significant change over the last 10 days following initiation of Eliquis, as further profile above.  She appears hemodynamically stable.  However, will further the possibility of acute gastrointestinal bleed as the presence of such would serve as a complicating factor that the patient is subsequently determined to have an acute pulmonary embolism.  CBC ordered upon arrival was along.   Patient does have a documented history of gastritis observed on most recent prior EGD from 2018.  She also has a documented history of GERD and is on Protonix daily as an outpatient.  No known history of liver disease.  Therefore, there appears to be no indication for initiation of SBP prophylaxis at this time.  Additionally, patient still experiencing an acute upper gastrointestinal bleed, we will refrain from Protonix drip, but rather proceed with Protonix 40 mg IV twice daily for now.    Plan: CBC upon arrival +.  Type and screen ordered.  Q for H&H as ordered through 9 AM on 03/27/2022.  Per recommendation for  outpatient vascular surgery holding Eliquis on 03/26/2022, pursuing further evaluation and management of right-sided pleuritic chest discomfort, as above, including pursuit of VQ scan.  Monitor on symmetry.  Repeat CMP in the morning, including for the purpose of trending interval BUN.  Protonix 40 mg IV twice daily.  Repeat INR in the morning.  Clear liquids.  Would consider GI consultation, for significant decrease in ensuing hemoglobin trend.            #) Recently diagnosed acute right lower extremity DVT: Diagnosed with acute DVT of the right femoral vein/right popliteal vein on venous ultrasound performed on 03/16/2022, prompting initiation of Eliquis at that time.  In light patient report of development of dark loose stool for last 2 days, vascular surgery recommended holding Eliquis for 1 day on 03/26/22.  We will observe this recommendation, will further evaluate for acute pulmonary embolism via VQ  scan, as above.   Plan: Holding home Eliquis for now, including vascular surgery recommendations, as above.  Evaluation and management of presenting right-sided pleuritic chest pain, including stat VQ scan, as above.  Further evaluation of the patient's reported 2 days of intermittent dark-colored stool, as above, including close trending of serial hemoglobin values.           #) Anemia of chronic kidney disease: Documented history of such, a/w with baseline hgb range 8.5-9.5, with presenting hgb appearing stable and consistent with this range.  Will further evaluate potential acute gastrointestinal bleed, as further detailed above, including serial trending of hemoglobin values, without current indication for transfusion.    Plan: CBC ordered upon arrival at [facility], followed by every 4 hour H&H trending ordered through 9 AM on 03/27/2022.  Type and screen ordered.  Repeat INR in the morning.  Further evaluation and management of patient's report of dark stools over the last 2 days,  as further detailed above.           #) Chronic diastolic heart failure: documented history of such, with most recent echocardiogram performed in February 2023, which was notable for LVEF 55 to 16%, grade 1 diastolic dysfunction, and normal right ventricular systolic function.. No clinical or radiographic evidence to suggest acutely decompensated heart failure at this time. home diuretic regimen reportedly consists of the following: Torsemide 20 mg p.o. daily.  Given ongoing evaluation to determine the presence of acute pulmonary embolism, which can be associated with an element of preload dependence, will hold next dose of torsemide, will closely monitoring to evaluate status, as further detailed below.  Additionally, in the setting of ongoing evaluation for the possibility of acute GI bleed, also transiently hold torsemide on this basis as well.    Plan: monitor strict I's & O's and daily weights. Repeat CMP in AM. Check serum mag level.  Home torsemide for now, will follow for result of VQ scan, as above.          #) Type 2 Diabetes Mellitus: documented history of such. Home insulin regimen: Insulin glargine 42 units SQ daily as well as scheduled NovoLog 14 units 3 times daily with meals. Home oral hypoglycemic agents: None. presenting blood sugar: 276 in the absence of any corresponding anion gap metabolic acidosis or additional evidence to suggest DKA. Most recent A1c noted to be 7.1% on 02/12/2022. in terms of initial dose of basal insulin to be started during this hospitalization, will resume approximately half of outpatient dose in order to reduce risk for ensuing hypoglycemia   Plan: In the setting of current clear liquid diet as component of evaluation management of potential presenting acute gastrointestinal bleeding, will pursue Accu-Cheks on a every 6 hours basis with associated low-dose sliding scale insulin.  Hold home scheduled short-acting insulin for now.  Lantus 18 units  subcu nightly, as further detailed above.            #) CKD Stage 3B: Documented history of such, with baseline creatinine appearing to be in the range of 2-2.3, with presenting creatinine consistent with this baseline.    Plan: Monitor strict I's and O's and daily weights.  Attempt to avoid nephrotoxic agents.  CMP/magnesium level in the AM.              #) Essential Hypertension: documented h/o such, with outpatient antihypertensive regimen including Coreg and torsemide.  SBP's in the ED today in the low 100s to 120s mmHg.  In the  setting of ongoing evaluation for the possibility of acute gastrointestinal bleeding acute pulmonary embolism, will hold home beta-blocker torsemide this evening, with timing of resumption of this medication spent upon ensuing evaluation for acute GI bleed/acute pulmonary embolism, as outlined above.  It is also noted, per review of most recent 2 prior EKGs, that there are worsening element of first-degree AV block on existing dose of Coreg, potentially warranting  associated consideration for ensuing dose reduction of home BB.  Plan: Close monitoring of subsequent BP via routine VS. Holding home BB and torsemide for now, as above.  Monitor strict I's and O's and daily weights.           #) COPD: Documented history of such, felt to be due to secondary exposure in this lifelong non-smoker.  Clinically, presentation less suggestive of acute exacerbation at this time.  Plan: Continue Symbicort.  As needed albuterol nebulizer.           #) Generalized anxiety disorder: Documented history of such, per chart review, tolerates benzodiazepines, on as needed Xanax as an outpatient.  Does not appear to be on a scheduled SSRI or SNRI at home.   Plan: Prn IV Ativan.               #) Allergic Rhinitis: documented h/o such, on scheduled intranasal Flonase as outpatient.    Plan: cont home Flonase.         DVT  prophylaxis: SCD's   Code Status: DNR/DNI (consistent with active MOST form on file and supported by CODE STATUS documentation from the last several consecutive hospitalizations) Family Communication: none Disposition Plan: Per Rounding Team Consults called: none;  Admission status: obs     Almetta Liddicoat B Avaleen Brownley DO [facility] [place]   03/26/2022, 8:56 PM

## 2022-03-26 NOTE — ED Notes (Signed)
Pt in CT.

## 2022-03-26 NOTE — ED Notes (Signed)
Report given to [facility] 

## 2022-03-26 NOTE — Progress Notes (Signed)
Plan of Care Note for accepted transfer  Patient: [name]    TIR:[id number]  DOA: 03/26/2022     Facility requesting transfer: [facility] Requesting Provider: Dr. Wyvonnia Dusky EDP Reason for transfer: Admission for pleuritic chest pain concerning for PE needing a VQ scan and melena in a patient who is on anticoagulation Facility course: Patient was admitted to [facility] ED with complaints of melena and shortness of breath. PMH of CKD 3B, chronic HFpEF, HTN, COPD, type II DM, HLD, obesity, GERD, dementia, anemia of chronic CKD, breast cancer. Hospitalized on 7/21 for acute on chronic CHF and AKI on CKD 3B. Hospitalized in 10/23 for pneumobilia of unclear etiology Seen in the ER on 11/9 for a positive DVT study outpatient and was discharged home on oral Eliquis. Referred to vascular surgery on 11/14 none recommendation was for 3 to 6 months of anticoagulation without any planned intervention. Patient was on aspirin and Plavix.  Aspirin was stopped.  Plavix was continued. Called vascular surgery on 11/18 with reports of dark stool.  Was recommended to hold Eliquis starting 11/19.  And today on 11/19 continues to have dark stool and was brought to ER for further evaluation. Hemoccult was negative in ER Hemoglobin is stable 9.5.  9.4 on 11/7. Serum creatinine is stable 2.13.  BUN is stable at 52. Patient also had complaints of shortness of breath on pleuritic chest pain troponin level normal.  EKG unremarkable for any acute ischemia. EDP with concerns for PE requesting the V/Q study. Also with concern for dark stool requesting close observation in the hospital.  Plan of care: The patient is accepted for admission to [facility] unit, at [facility]. If the patient goes to [facility] for progressive level of care is appropriate. This level of care is required as the patient has symptoms of acute PE requiring further work-up as well as symptoms of melena requiring close  observation with stable hemoglobin while on anticoagulation. Patient will receive IV Protonix by EDP. EDP has ordered a CT abdomen and pelvis and they will follow-up on the reports. Patient will require VQ scan stat on arrival to either campus. Recommended EDP to recheck CBC and monitor CBC every 6 hours, if hemoglobin continues to drop will require transfusion as well as GI consult.  Otherwise IV heparin if hemoglobin remained stable.  Author: Berle Mull, MD  03/26/2022  Check [email or web address] for on-call coverage.  Nursing staff, Please call [place] number on Amion as soon as patient's arrival, so appropriate admitting provider can evaluate the pt.

## 2022-03-26 NOTE — ED Triage Notes (Signed)
Patient here POV from Home.  Endorses being diagnosed with DVT recently and beginning Eliquis. Began 1.5 Weeks ago and prior to that has been taking Plavix. Noted a Dark Stool yesterday and today that was concerning.   NAD Noted during Triage. A&Ox4. GCS 15. BIB Wheelchair.

## 2022-03-27 ENCOUNTER — Inpatient Hospital Stay (HOSPITAL_COMMUNITY): Payer: PPO

## 2022-03-27 DIAGNOSIS — E1165 Type 2 diabetes mellitus with hyperglycemia: Secondary | ICD-10-CM | POA: Diagnosis present

## 2022-03-27 DIAGNOSIS — E1121 Type 2 diabetes mellitus with diabetic nephropathy: Secondary | ICD-10-CM | POA: Diagnosis present

## 2022-03-27 DIAGNOSIS — I5032 Chronic diastolic (congestive) heart failure: Secondary | ICD-10-CM | POA: Diagnosis present

## 2022-03-27 DIAGNOSIS — J449 Chronic obstructive pulmonary disease, unspecified: Secondary | ICD-10-CM | POA: Diagnosis present

## 2022-03-27 DIAGNOSIS — F0394 Unspecified dementia, unspecified severity, with anxiety: Secondary | ICD-10-CM | POA: Diagnosis present

## 2022-03-27 DIAGNOSIS — Z7901 Long term (current) use of anticoagulants: Secondary | ICD-10-CM | POA: Diagnosis not present

## 2022-03-27 DIAGNOSIS — K921 Melena: Secondary | ICD-10-CM | POA: Diagnosis present

## 2022-03-27 DIAGNOSIS — K922 Gastrointestinal hemorrhage, unspecified: Secondary | ICD-10-CM | POA: Insufficient documentation

## 2022-03-27 DIAGNOSIS — N1832 Chronic kidney disease, stage 3b: Secondary | ICD-10-CM | POA: Diagnosis present

## 2022-03-27 DIAGNOSIS — I130 Hypertensive heart and chronic kidney disease with heart failure and stage 1 through stage 4 chronic kidney disease, or unspecified chronic kidney disease: Secondary | ICD-10-CM | POA: Diagnosis present

## 2022-03-27 DIAGNOSIS — Z6841 Body Mass Index (BMI) 40.0 and over, adult: Secondary | ICD-10-CM | POA: Diagnosis not present

## 2022-03-27 DIAGNOSIS — I252 Old myocardial infarction: Secondary | ICD-10-CM | POA: Diagnosis not present

## 2022-03-27 DIAGNOSIS — Z853 Personal history of malignant neoplasm of breast: Secondary | ICD-10-CM | POA: Diagnosis not present

## 2022-03-27 DIAGNOSIS — E1122 Type 2 diabetes mellitus with diabetic chronic kidney disease: Secondary | ICD-10-CM | POA: Diagnosis present

## 2022-03-27 DIAGNOSIS — I10 Essential (primary) hypertension: Secondary | ICD-10-CM | POA: Diagnosis not present

## 2022-03-27 DIAGNOSIS — E785 Hyperlipidemia, unspecified: Secondary | ICD-10-CM | POA: Diagnosis present

## 2022-03-27 DIAGNOSIS — K219 Gastro-esophageal reflux disease without esophagitis: Secondary | ICD-10-CM | POA: Diagnosis present

## 2022-03-27 DIAGNOSIS — F411 Generalized anxiety disorder: Secondary | ICD-10-CM | POA: Diagnosis present

## 2022-03-27 DIAGNOSIS — Z794 Long term (current) use of insulin: Secondary | ICD-10-CM | POA: Diagnosis not present

## 2022-03-27 DIAGNOSIS — I82431 Acute embolism and thrombosis of right popliteal vein: Secondary | ICD-10-CM | POA: Diagnosis present

## 2022-03-27 DIAGNOSIS — K8020 Calculus of gallbladder without cholecystitis without obstruction: Secondary | ICD-10-CM | POA: Diagnosis present

## 2022-03-27 DIAGNOSIS — D631 Anemia in chronic kidney disease: Secondary | ICD-10-CM | POA: Diagnosis present

## 2022-03-27 DIAGNOSIS — Z66 Do not resuscitate: Secondary | ICD-10-CM | POA: Diagnosis present

## 2022-03-27 DIAGNOSIS — R0789 Other chest pain: Secondary | ICD-10-CM | POA: Diagnosis not present

## 2022-03-27 DIAGNOSIS — I82411 Acute embolism and thrombosis of right femoral vein: Secondary | ICD-10-CM | POA: Diagnosis present

## 2022-03-27 DIAGNOSIS — Z9884 Bariatric surgery status: Secondary | ICD-10-CM | POA: Diagnosis not present

## 2022-03-27 LAB — HEMOGLOBIN AND HEMATOCRIT, BLOOD
HCT: 29 % — ABNORMAL LOW (ref 36.0–46.0)
Hemoglobin: 8.7 g/dL — ABNORMAL LOW (ref 12.0–15.0)

## 2022-03-27 LAB — CBC WITH DIFFERENTIAL/PLATELET
Abs Immature Granulocytes: 0.11 10*3/uL — ABNORMAL HIGH (ref 0.00–0.07)
Basophils Absolute: 0 10*3/uL (ref 0.0–0.1)
Basophils Relative: 0 %
Eosinophils Absolute: 0.3 10*3/uL (ref 0.0–0.5)
Eosinophils Relative: 3 %
HCT: 28.2 % — ABNORMAL LOW (ref 36.0–46.0)
Hemoglobin: 8.7 g/dL — ABNORMAL LOW (ref 12.0–15.0)
Immature Granulocytes: 1 %
Lymphocytes Relative: 30 %
Lymphs Abs: 3.1 10*3/uL (ref 0.7–4.0)
MCH: 29.1 pg (ref 26.0–34.0)
MCHC: 30.9 g/dL (ref 30.0–36.0)
MCV: 94.3 fL (ref 80.0–100.0)
Monocytes Absolute: 0.6 10*3/uL (ref 0.1–1.0)
Monocytes Relative: 6 %
Neutro Abs: 6.2 10*3/uL (ref 1.7–7.7)
Neutrophils Relative %: 60 %
Platelets: 253 10*3/uL (ref 150–400)
RBC: 2.99 MIL/uL — ABNORMAL LOW (ref 3.87–5.11)
RDW: 13.5 % (ref 11.5–15.5)
WBC: 10.3 10*3/uL (ref 4.0–10.5)
nRBC: 0 % (ref 0.0–0.2)

## 2022-03-27 LAB — APTT
aPTT: 36 seconds (ref 24–36)
aPTT: 182 seconds (ref 24–36)

## 2022-03-27 LAB — COMPREHENSIVE METABOLIC PANEL
ALT: 10 U/L (ref 0–44)
AST: 14 U/L — ABNORMAL LOW (ref 15–41)
Albumin: 3 g/dL — ABNORMAL LOW (ref 3.5–5.0)
Alkaline Phosphatase: 81 U/L (ref 38–126)
Anion gap: 8 (ref 5–15)
BUN: 53 mg/dL — ABNORMAL HIGH (ref 8–23)
CO2: 28 mmol/L (ref 22–32)
Calcium: 8.4 mg/dL — ABNORMAL LOW (ref 8.9–10.3)
Chloride: 108 mmol/L (ref 98–111)
Creatinine, Ser: 2.01 mg/dL — ABNORMAL HIGH (ref 0.44–1.00)
GFR, Estimated: 24 mL/min — ABNORMAL LOW (ref >60)
Glucose, Bld: 122 mg/dL — ABNORMAL HIGH (ref 70–99)
Potassium: 4.8 mmol/L (ref 3.5–5.1)
Sodium: 144 mmol/L (ref 135–145)
Total Bilirubin: 0.7 mg/dL (ref 0.3–1.2)
Total Protein: 7.2 g/dL (ref 6.5–8.1)

## 2022-03-27 LAB — PHOSPHORUS: Phosphorus: 3.8 mg/dL (ref 2.5–4.6)

## 2022-03-27 LAB — OCCULT BLOOD X 1 CARD TO LAB, STOOL: Fecal Occult Bld: NEGATIVE

## 2022-03-27 LAB — HEPARIN LEVEL (UNFRACTIONATED): Heparin Unfractionated: >1.1 IU/mL — ABNORMAL HIGH (ref 0.30–0.70)

## 2022-03-27 LAB — MAGNESIUM: Magnesium: 1.2 mg/dL — ABNORMAL LOW (ref 1.7–2.4)

## 2022-03-27 LAB — GLUCOSE, CAPILLARY
Glucose-Capillary: 127 mg/dL — ABNORMAL HIGH (ref 70–99)
Glucose-Capillary: 117 mg/dL — ABNORMAL HIGH (ref 70–99)
Glucose-Capillary: 91 mg/dL (ref 70–99)
Glucose-Capillary: 159 mg/dL — ABNORMAL HIGH (ref 70–99)
Glucose-Capillary: 87 mg/dL (ref 70–99)

## 2022-03-27 LAB — PROTIME-INR
INR: 1.8 — ABNORMAL HIGH (ref 0.8–1.2)
Prothrombin Time: 20.5 seconds — ABNORMAL HIGH (ref 11.4–15.2)

## 2022-03-27 MED ORDER — HEPARIN (PORCINE) 25000 UT/250ML-% IV SOLN
1500.0000 [IU]/h | INTRAVENOUS | Status: DC
  Administered 2022-03-27: 1500 [IU]/h via INTRAVENOUS
  Filled 2022-03-27: qty 250

## 2022-03-27 MED ORDER — HEPARIN (PORCINE) 25000 UT/250ML-% IV SOLN
1200.0000 [IU]/h | INTRAVENOUS | Status: DC
  Administered 2022-03-28: 1200 [IU]/h via INTRAVENOUS
  Filled 2022-03-27: qty 250

## 2022-03-27 MED ORDER — MAGNESIUM SULFATE 2 GM/50ML IV SOLN
2.0000 g | Freq: Once | INTRAVENOUS | Status: AC
  Administered 2022-03-27: 2 g via INTRAVENOUS
  Filled 2022-03-27: qty 50

## 2022-03-27 MED ORDER — TECHNETIUM TO 99M ALBUMIN AGGREGATED
3.6200 | Freq: Once | INTRAVENOUS | Status: AC | PRN
  Administered 2022-03-27: 3.62 via INTRAVENOUS

## 2022-03-27 NOTE — Progress Notes (Signed)
[facility] Progress Note Patient: [name] OEV:[id number] DOB: [birth year] DOA: 03/26/2022  DOS: the patient was seen and examined on 03/27/2022  Brief hospital course: PMH of CKD 3B, chronic HFpEF, HTN, COPD, type II DM, HLD, obesity, GERD, dementia, anemia of chronic CKD, breast cancer. Hospitalized on 7/21 for acute on chronic CHF and AKI on CKD 3B. Hospitalized in 10/23 for pneumobilia of unclear etiology Seen in the ER on 11/9 for a positive DVT study outpatient and was discharged home on oral Eliquis. Referred to vascular surgery on 11/14 none recommendation was for 3 to 6 months of anticoagulation without any planned intervention. Patient was on aspirin and Plavix.  Aspirin was stopped.  Plavix was continued. Called vascular surgery on 11/18 with reports of dark stool.  Was recommended to hold Eliquis starting 11/19.  And today on 11/19 continues to have dark stool and was brought to ER for further evaluation. Assessment and Plan: Concern for melena/BRBPR. Patient had 1 episode of BRBPR followed by 2 episodes of melena which led the family to bring the patient to the hospital. Currently Hemoccult x2 is negative. Also H&H relatively stable. Patient does not appear to have any active bleeding based on the evaluation. Continue with IV PPI twice daily. We will initiate IV heparin for therapeutic anticoagulation and continue with clear liquid diet. Currently no indication to consult GI.  Shortness of breath secondary to acute PE. Acute DVT. Patient was started on oral anticoagulation with Eliquis earlier this month. VQ scan back and was negative but VQ scan this admission is showing possible pulmonary infarct bilaterally. Patient is not hypoxic, not tachycardic and not hypotensive. At present currently on IV heparin.  Continue with anticoagulation. Continue pain control.  Anemia of chronic kidney disease. Patient feels remarkably stable. Monitor.  Chronic diastolic  CHF. Recent echocardiogram showed a EF of 50 to 50%. Normal RV. On torsemide currently on hold. We will resume tomorrow.  Type 2 diabetes mellitus, uncontrolled with hyperglycemia with long-term insulin use with nephropathy. Continue sliding scale insulin. Monitor for CKD 3B. Renal function stable. Monitor.  HTN. Blood pressure stable. There are some concern for low blood pressure earlier. Currently holding oral antihypertensive regimen.  COPD. Does not appear to have any exacerbation. Monitor.  Anxiety. Continue home regimen.  Subjective: No nausea no vomiting no fever no chills.  Tired of being poked.  No no blood in the stool.  Physical Exam: General: in mild distress;  Cardiovascular: S1 and S2 Present, no Murmur Respiratory: normal respiratory effort, Bilateral Air entry present, no Crackles, no wheezes Abdomen: Bowel Sound present, Non tender  Extremities: no edema Neurology: alert and oriented to time, place, and person   Data Reviewed: I have Reviewed nursing notes, Vitals, and Lab results. Since last encounter, pertinent lab results CBC and BMP   . I have ordered test including CBC and BMP  .   Disposition: Status is: Inpatient Remains inpatient appropriate because: Need to monitor H&H closely and need to stay on IV heparin as Ensure stability without any bleeding.  SCDs Start: 03/26/22 1931   Family Communication: Daughter at bedside Level of care: Telemetry Continue telemetry due to presentation of PE and GI bleed.  Transfer out of stepdown unit. Vitals:   03/27/22 0800 03/27/22 0900 03/27/22 1058 03/27/22 1543  BP: (!) 119/33 (!) 108/48 (!) 131/45 (!) 134/47  Pulse: 76 77 77 68  Resp: (!) 21 (!) 27 (!) 22 20  Temp:   98.1 F (36.7 C) 98.6 F (  37 C)  TempSrc:   Oral Oral  SpO2: 97% 96% 95% 95%  Weight:      Height:         Author: Berle Mull, MD 03/27/2022 7:21 PM  Please look on [email or web address] to find out who is on call.

## 2022-03-27 NOTE — Progress Notes (Signed)
[place] for IV heparin Indication: recent DVT, possible PE in setting of GIB  Allergies  Allergen Reactions   Sulfonamide Derivatives Swelling and Other (See Comments)    Mouth swelling- no resp issues noted   Lokelma [Sodium Zirconium Cyclosilicate] Other (See Comments)    Headache, stomach upset   Aricept [Donepezil Hcl] Diarrhea and Nausea And Vomiting    GI upset/loose stools   Tramadol Nausea And Vomiting    Patient Measurements: Height: '5\' 7"'$  (170.2 cm) Weight: 133.8 kg (294 lb 15.6 oz) IBW/kg (Calculated) : 61.6 Heparin Dosing Weight: 94 kg (used [place] nomogram for dosing)  Vital Signs: Temp: 98.3 F (36.8 C) (11/20 0759) Temp Source: Oral (11/20 0759) BP: 108/48 (11/20 0900) Pulse Rate: 77 (11/20 0900)  Labs: Recent Labs    03/26/22 1340 03/26/22 1343 03/26/22 1815 03/26/22 2135 03/27/22 0035 03/27/22 1000  HGB 9.5*  --  9.4* 9.4* 8.7* 8.7*  HCT 31.4*  --  31.2* 31.1* 28.2* 29.0*  PLT 264  --   --  263 253  --   LABPROT  --  22.1*  --   --  20.5*  --   INR  --  2.0*  --   --  1.8*  --   CREATININE 2.13*  --   --   --  2.01*  --   TROPONINIHS  --  13 13  --   --   --     Estimated Creatinine Clearance: 29.2 mL/min (A) (by C-G formula based on SCr of 2.01 mg/dL (H)).   Medical History: Past Medical History:  Diagnosis Date   Abdominal pain 01/26/2012   Acute kidney injury superimposed on chronic kidney disease ([place]) 12/18/2014   Allergy    takes Mucinex daily as needed   Anemia, unspecified    Anxiety    takes Clonazepam daily as needed   Anxiety associated with depression 06/08/2017   Arthritis    Breast cancer ([place])    Breast cancer screening 02/19/2014   Breast mass 10/27/2014   Chronic diastolic CHF (congestive heart failure) ([place])    takes Furosemide daily   Chronic pain syndrome 02/06/2015   CKD (chronic kidney disease), stage III ([place])    Coronary artery disease    a. s/p IMI 2004 tx with BMS to  RCA;  b. s/p Promus DES to RCA 2/12 c. abnormal nuc 2016 -> cath with med rx. d. NSTEMI 02/2020  mv CAD with severe ISR in pRCA and m/dRCA lesion both treated with DES.   Debility 08/01/2012   Dementia ([place])    Depression    takes Cymbalta daily   Diabetes mellitus    insulin daily   DOE (dyspnea on exertion) 06/24/2014   Dysphagia, unspecified(787.20) 07/31/2012   Fungal dermatitis 11/13/2007   Qualifier: Diagnosis of  By: Burnice Logan  MD, Doretha Sou    GERD (gastroesophageal reflux disease)    takes Protonix daily   Gout, unspecified    Headache    occasionally   History of blood clots    History of deep venous thrombosis 01/27/2012   History of pulmonary embolus (PE) 04/22/2014   Hyperkalemia 07/26/2017   Hyperlipidemia    takes Pravastatin daily   Hypertension    Insomnia    Joint pain    Joint swelling    Morbid obesity ([place])    Multiple benign nevi 11/15/2016   Myocardial infarction ([place]) 2004   Non-STEMI (non-ST elevated myocardial infarction) ([place]) 02/15/2020   Numbness  Obstructive sleep apnea    does not wear cpap   Osteoarthritis    Osteoarthritis    Osteoarthrosis, unspecified whether generalized or localized, lower leg    Pain in the chest 12/31/2013   Pain, chronic    Personal history of other diseases of the digestive system 12/03/2006   Qualifier: Diagnosis of  By: Burnice Logan  MD, Doretha Sou    Polymyalgia rheumatica [facility])    Presence of stent in right coronary artery    Pulmonary emboli ([place]) 01/2012   felt to need lifelong anticoagulation but Xarelto dc in 02/2020 due to need for DAPT   Situational depression 06/04/2009   Qualifier: Diagnosis of  By: Burnice Logan  MD, Doretha Sou    Syncope, vasovagal 07/04/2021   Urinary incontinence    takes Linzess daily   Vaginitis and vulvovaginitis 06/23/2016   Vertigo    hx of;was taking Meclizine if needed   Wears dentures     Medications:  Medications Prior to Admission  Medication Sig Dispense Refill Last Dose    albuterol (PROAIR HFA) 108 (90 Base) MCG/ACT inhaler Inhale 1-2 puffs into the lungs every 6 (six) hours as needed for wheezing or shortness of breath. 6.7 g 1    ALPRAZolam (XANAX) 0.5 MG tablet Take 1 tablet (0.5 mg total) by mouth 3 (three) times daily as needed (vertigo). 20 tablet 0    anastrozole (ARIMIDEX) 1 MG tablet TAKE 1 TABLET BY MOUTH EVERY DAY 28 tablet 1    APIXABAN (ELIQUIS) VTE STARTER PACK ('10MG'$  AND '5MG'$ ) Take as directed on package: start with 2 (two)-'5mg'$  tablets twice daily for 7 days. On day 8, switch to 1 (one)-'5mg'$  tablet twice daily. 74 each 0    atorvastatin (LIPITOR) 80 MG tablet TAKE 1 TABLET BY MOUTH EVERY DAY 90 tablet 3    budesonide-formoterol (SYMBICORT) 160-4.5 MCG/ACT inhaler Inhale 2 puffs into the lungs 2 (two) times daily. 1 each 3    carvedilol (COREG) 12.5 MG tablet TAKE 1 TABLET BY MOUTH 2 TIMES DAILY 180 tablet 1    cetirizine (ZYRTEC) 10 MG tablet Take 10 mg by mouth daily as needed for allergies or rhinitis.      clopidogrel (PLAVIX) 75 MG tablet Take 1 tablet (75 mg total) by mouth daily. 90 tablet 3    Continuous Blood Gluc Sensor (FREESTYLE LIBRE 14 DAY SENSOR) MISC PLACE 1 DEVICE ON THE SKIN AS DIRECTED EVERY 14 DAYS 6 each 2    diclofenac Sodium (VOLTAREN) 1 % GEL Apply 2 g topically 4 (four) times daily. 150 g 2    escitalopram (LEXAPRO) 20 MG tablet Take 0.5 tablets (10 mg total) by mouth daily for 7 days. Then stop 3.5 tablet 0    febuxostat (ULORIC) 40 MG tablet TAKE 1 TABLET BY MOUTH EVERY DAY 30 tablet 5    fluticasone (FLONASE) 50 MCG/ACT nasal spray Place 1 spray into both nostrils in the morning and at bedtime. 32.2 mL 3    folic acid (FOLVITE) 1 MG tablet TAKE 1 TABLET BY MOUTH EVERY DAY 90 tablet 1    guaifenesin (ROBITUSSIN) 100 MG/5ML syrup Take 200 mg by mouth 3 (three) times daily as needed for cough.      insulin aspart (NOVOLOG FLEXPEN) 100 UNIT/ML FlexPen Inject 14 Units into the skin 3 (three) times daily before meals.      insulin  glargine, 2 Unit Dial, (TOUJEO MAX SOLOSTAR) 300 UNIT/ML Solostar Pen Inject 42 Units into the skin daily. Patient assistance program provides 33  mL 6    linaclotide (LINZESS) 145 MCG CAPS capsule Take 1 capsule (145 mcg total) by mouth daily before breakfast. 30 capsule 2    meclizine (ANTIVERT) 25 MG tablet TAKE 1 TABLET BY MOUTH THREE TIMES DAILY AS NEEDED FOR DIZZINESS 30 tablet 11    memantine (NAMENDA) 5 MG tablet TAKE 2 TABLETS BY MOUTH EVERY DAY and TAKE 1 TABLET BY MOUTH EVERY EVENING 270 tablet 5    nitroGLYCERIN (NITROSTAT) 0.4 MG SL tablet Take one tablet under the tongue every 5 minutes as needed for chest pain 50 tablet 0    nystatin cream (MYCOSTATIN) Apply 1 Application topically 2 (two) times daily. 30 g 3    ondansetron (ZOFRAN) 4 MG tablet Take 1 tablet (4 mg total) by mouth every 8 (eight) hours as needed for nausea or vomiting. 20 tablet 0    OVER THE COUNTER MEDICATION Take 1 capsule by mouth every other day. Over the counter stool softener - unknown type      [START ON 04/30/2022] oxyCODONE-acetaminophen (PERCOCET) 7.5-325 MG tablet Take 1 tablet by mouth every 12 (twelve) hours. 60 tablet 0    [START ON 05/30/2022] oxyCODONE-acetaminophen (PERCOCET) 7.5-325 MG tablet Take 1 tablet by mouth every 12 (twelve) hours. 60 tablet 0    pantoprazole (PROTONIX) 20 MG tablet TAKE 1 TABLET BY MOUTH EVERY DAY 30 tablet 5    sodium polystyrene (SPS) 15 GM/60ML suspension take 73ms by MOUTH ONCE WEEKLY. TO keep potassium down 240 mL 2    torsemide (DEMADEX) 20 MG tablet TAKE 1 TABLET BY MOUTH EVERY DAY Take an extra dose for weight gain, 3lb in 1 day or 5lbs in 1 week 30 tablet 0    traZODone (DESYREL) 50 MG tablet Take 0.5 tablets (25 mg total) by mouth at bedtime. 45 tablet 3    Vitamin D, Ergocalciferol, (DRISDOL) 1.25 MG (50000 UNIT) CAPS capsule TAKE 1 CAPSULE BY MOUTH every 7 days 4 capsule 1    Scheduled:   anastrozole  1 mg Oral Daily   Chlorhexidine Gluconate Cloth  6 each  Topical Daily   fluticasone  1 spray Each Nare Daily   insulin aspart  0-9 Units Subcutaneous Q6H   insulin glargine-yfgn  18 Units Subcutaneous QHS   mometasone-formoterol  2 puff Inhalation BID   pantoprazole (PROTONIX) IV  40 mg Intravenous Q12H   PRN: acetaminophen **OR** acetaminophen, albuterol, fentaNYL (SUBLIMAZE) injection, LORazepam, naLOXone (NARCAN)  injection, ondansetron (ZOFRAN) IV, mouth rinse  Assessment: 89yoF with PMH recent RLE DVT on Eliquis starter pack, dCHF, DM2, CKD3  Baseline INR elevated d/t recent DOAC; aPTT & anti-Xa level pending Prior anticoagulation: Eliquis starter pack (dispensed 11/10, so presumably would have transitioned to 5 mg bid around 11/17); last dose likely just prior to admission  Significant events:  Today, 03/27/2022: CBC: Hgb low but stable FOBT neg x 2 No bleeding or infusion issues per nursing  Goal of Therapy: Heparin level 0.3-0.7 units/ml Monitor platelets by anticoagulation protocol: Yes  Plan: Heparin 1500 units/hr IV infusion, no bolus Check heparin level 8 hrs after start Daily CBC, daily heparin level once stable Monitor for signs of bleeding or thrombosis F/u ability to transition back to [employer] PharmD, BCPS [phone]/20/2023, 10:56 AM

## 2022-03-27 NOTE — Progress Notes (Signed)
Pharmacy: [facility]   Patient's an 87 y.o F with hx recent RLE DVT on Eliquis PTA who presented to the ED on 03/26/22 with c/o dark stools.  MD's note indicated no active bleeding on evaluation.  V/Q scan on 11/20 showed findings with suspicion for bilateral PE.    Anticoag. changed to heparin drip on 03/27/22.   - baseline heparin level >1.10 (elevated d/t effect of Eliqiis), aPTT 36 - Will monitor and dose heparin drip based on aPTT for now until heparin level and aPTT correlate, then will use only heparin level for monitoring.   - aPTT level collected at 7:30p is supra-therapeutic at 182 sec. Per pt's RN, no issues with IV line, no bleeding noted, rate running at 1500 units/hr and level was collected on the correct arm.  Goal of Therapy: Heparin level 0.3-0.7 units/ml aPTT level: 66-102 secs Monitor platelets by anticoagulation protocol: Yes  Plan: -  hold heparin drip for 1 hour, then resume back at 1200 units/hr - check  8 hr aPTT - monitor for s/sx bleeding   [name], PharmD, BCPS 03/27/2022 8:54 PM

## 2022-03-27 NOTE — TOC Initial Note (Signed)
Transition of Care [facility]) - Initial/Assessment Note    Patient Details  Name: [name] MRN: [id number] Date of Birth: [birth year]  Transition of Care [facility]) CM/SW Contact:    Leeroy Cha, RN Phone Number: 03/27/2022, 8:48 AM  Clinical Narrative:                  Transition of Care [facility]) Screening Note   Patient Details  Name: [name] Date of Birth: 13-Feb-1935   Transition of Care [facility]) CM/SW Contact:    Leeroy Cha, RN Phone Number: 03/27/2022, 8:48 AM    Transition of Care Department [facility]) has reviewed patient and no TOC needs have been identified at this time. We will continue to monitor patient advancement through interdisciplinary progression rounds. If new patient transition needs arise, please place a TOC consult.    Expected Discharge Plan: Home/Self Care Barriers to Discharge: Continued Medical Work up   Patient Goals and CMS Choice Patient states their goals for this hospitalization and ongoing recovery are:: to go home CMS Medicare.gov Compare Post Acute Care list provided to:: Patient    Expected Discharge Plan and Services Expected Discharge Plan: Home/Self Care   Discharge Planning Services: CM Consult   Living arrangements for the past 2 months: Single Family Home                                      Prior Living Arrangements/Services Living arrangements for the past 2 months: Single Family Home Lives with:: Self Patient language and need for interpreter reviewed:: Yes Do you feel safe going back to the place where you live?: Yes            Criminal Activity/Legal Involvement Pertinent to Current Situation/Hospitalization: No - Comment as needed  Activities of Daily Living      Permission Sought/Granted                  Emotional Assessment Appearance:: Appears stated age Attitude/Demeanor/Rapport: Engaged Affect (typically observed): Calm Orientation: : Oriented to Self, Oriented to Place,  Oriented to  Time, Oriented to Situation Alcohol / Substance Use: Never Used Psych Involvement: No (comment)  Admission diagnosis:  Melena [K92.1] Acute upper GI bleed [K92.2] Dyspnea, unspecified type [R06.00] Patient Active Problem List   Diagnosis Date Noted   Acute upper GI bleed 03/26/2022   Melena 03/26/2022   Acute deep vein thrombosis (DVT) of femoral vein of right lower extremity ([place]) 03/26/2022   Stage 3b chronic kidney disease (CKD) ([place]) 03/26/2022   COPD (chronic obstructive pulmonary disease) ([place]) 03/26/2022   History of anemia due to chronic kidney disease 03/26/2022   GAD (generalized anxiety disorder) 03/26/2022   Allergic rhinitis 03/26/2022   Type 2 diabetes mellitus with stage 4 chronic kidney disease, with long-term current use of insulin ([place]) 02/16/2022   Type 2 diabetes mellitus with diabetic neuropathy, with long-term current use of insulin ([place]) 02/16/2022   DM2 (diabetes mellitus, type 2) ([place]) 02/16/2022   Dermatosis papulosa nigra 02/09/2022   Positive D dimer 02/09/2022   CKD (chronic kidney disease), stage IV ([place]) 02/09/2022   Pneumobilia 02/08/2022   Severe episode of recurrent major depressive disorder, without psychotic features ([place]) 12/05/2021   AKI (acute kidney injury) ([place]) 11/22/2021   Acute on chronic diastolic (congestive) heart failure ([place]) 11/19/2021   Anxiety 11/10/2021   Breast cancer ([place]) 10/22/2021   Right elbow pain 04/27/2021  Chronic pain of right hand 04/27/2021   Iron deficiency anemia 09/13/2020   Bilateral primary osteoarthritis of knee 04/12/2020   Chronic radicular lumbar pain 03/18/2020   Lumbar spondylosis 03/18/2020   Spinal stenosis, lumbar region, with neurogenic claudication 03/18/2020   Moderate nonproliferative diabetic retinopathy of both eyes without macular edema associated with type 2 diabetes mellitus ([place]) 08/27/2019   Type 2 diabetes mellitus with hyperlipidemia ([place]) 12/13/2017   GERD with esophagitis  11/15/2016   Dementia ([place]) 11/15/2016   Endometrial polyp 06/23/2016   Vitamin D deficiency 08/02/2015   Acute kidney injury superimposed on chronic kidney disease ([place]) 12/18/2014   Gout 10/27/2014   CN (constipation) 05/10/2014   COPD with chronic bronchitis 04/21/2014   Estrogen deficiency 02/19/2014   Atypical chest pain 12/31/2013   Insomnia 09/17/2012   Long term current use of anticoagulant therapy 02/02/2012   Chronic diastolic CHF (congestive heart failure) ([place]) 01/29/2012   Class 3 obesity ([place]) 02/15/2009   Microcytic anemia 02/15/2009   TENSION HEADACHE 01/07/2008   Hyperlipidemia 12/03/2006   Essential hypertension 12/03/2006   PCP:  Bonnita Hollow, MD Pharmacy:   [facility] [address], [place] - [place] [address] Phone: [phone] Fax: [phone]  [place], [place] - [address] [place] [address] Phone: [phone] Fax: [phone]     Social Determinants of Health ([place]) Interventions    Readmission Risk Interventions   Row Labels 11/24/2021   10:03 AM 10/20/2021    3:50 PM 02/18/2020   11:52 AM  Readmission Risk Prevention Plan   Section Header. No data exists in this row.     Transportation Screening   Complete Complete Complete  Medication Review Press photographer)   Complete Complete Complete  PCP or Specialist appointment within 3-5 days of discharge   Complete Complete Complete  HRI or [place]   Complete Complete Complete  SW Recovery Care/Counseling Consult   Complete Complete Complete  Palliative Care Screening   Not Applicable Not Applicable Not [place]   Not Applicable Not Applicable Not Applicable

## 2022-03-27 NOTE — Hospital Course (Signed)
PMH of CKD 3B, chronic HFpEF, HTN, COPD, type II DM, HLD, obesity, GERD, dementia, anemia of chronic CKD, breast cancer. Hospitalized on 7/21 for acute on chronic CHF and AKI on CKD 3B. Hospitalized in 10/23 for pneumobilia of unclear etiology Seen in the ER on 11/9 for a positive DVT study outpatient and was discharged home on oral Eliquis. Referred to vascular surgery on 11/14 none recommendation was for 3 to 6 months of anticoagulation without any planned intervention. Patient was on aspirin and Plavix.  Aspirin was stopped.  Plavix was continued. Called vascular surgery on 11/18 with reports of dark stool.  Was recommended to hold Eliquis starting 11/19.  And today on 11/19 continues to have dark stool and was brought to ER for further evaluation.

## 2022-03-28 ENCOUNTER — Other Ambulatory Visit: Payer: Self-pay | Admitting: Family Medicine

## 2022-03-28 DIAGNOSIS — R0789 Other chest pain: Secondary | ICD-10-CM | POA: Diagnosis not present

## 2022-03-28 LAB — CBC
HCT: 29.9 % — ABNORMAL LOW (ref 36.0–46.0)
Hemoglobin: 9 g/dL — ABNORMAL LOW (ref 12.0–15.0)
MCH: 28.8 pg (ref 26.0–34.0)
MCHC: 30.1 g/dL (ref 30.0–36.0)
MCV: 95.5 fL (ref 80.0–100.0)
Platelets: 242 10*3/uL (ref 150–400)
RBC: 3.13 MIL/uL — ABNORMAL LOW (ref 3.87–5.11)
RDW: 13.6 % (ref 11.5–15.5)
WBC: 10 10*3/uL (ref 4.0–10.5)
nRBC: 0 % (ref 0.0–0.2)

## 2022-03-28 LAB — GLUCOSE, CAPILLARY
Glucose-Capillary: 117 mg/dL — ABNORMAL HIGH (ref 70–99)
Glucose-Capillary: 182 mg/dL — ABNORMAL HIGH (ref 70–99)
Glucose-Capillary: 83 mg/dL (ref 70–99)
Glucose-Capillary: 95 mg/dL (ref 70–99)

## 2022-03-28 LAB — APTT: aPTT: 69 seconds — ABNORMAL HIGH (ref 24–36)

## 2022-03-28 LAB — HEPARIN LEVEL (UNFRACTIONATED): Heparin Unfractionated: >1.1 IU/mL — ABNORMAL HIGH (ref 0.30–0.70)

## 2022-03-28 MED ORDER — APIXABAN 5 MG PO TABS
5.0000 mg | ORAL_TABLET | Freq: Two times a day (BID) | ORAL | Status: DC
  Administered 2022-03-28 – 2022-03-29 (×3): 5 mg via ORAL
  Filled 2022-03-28 (×3): qty 1

## 2022-03-28 NOTE — Progress Notes (Addendum)
ANTICOAGULATION CONSULT NOTE - Follow Up Consult  Pharmacy Consult for Heparin>>Eliquis Indication: New DVT and PEs  Allergies  Allergen Reactions   Sulfonamide Derivatives Swelling and Other (See Comments)    Mouth swelling- no resp issues noted   Lokelma [Sodium Zirconium Cyclosilicate] Other (See Comments)    Headache, stomach upset   Aricept [Donepezil Hcl] Diarrhea and Nausea And Vomiting    GI upset/loose stools   Tramadol Nausea And Vomiting    Patient Measurements: Height: '5\' 7"'$  (170.2 cm) Weight: 133.8 kg (294 lb 15.6 oz) IBW/kg (Calculated) : 61.6 Heparin Dosing Weight: 94 kg  Vital Signs: Temp: 97.8 F (36.6 C) (11/21 0540) Temp Source: Oral (11/21 0540) BP: 134/54 (11/21 0540) Pulse Rate: 68 (11/21 0540)  Labs: Recent Labs    03/26/22 1340 03/26/22 1343 03/26/22 1815 03/26/22 2135 03/27/22 0035 03/27/22 1000 03/27/22 1112 03/27/22 1930 03/28/22 0725  HGB 9.5*  --  9.4* 9.4* 8.7* 8.7*  --   --  9.0*  HCT 31.4*  --  31.2* 31.1* 28.2* 29.0*  --   --  29.9*  PLT 264  --   --  263 253  --   --   --  242  APTT  --   --   --   --   --   --  36 182* 69*  LABPROT  --  22.1*  --   --  20.5*  --   --   --   --   INR  --  2.0*  --   --  1.8*  --   --   --   --   HEPARINUNFRC  --   --   --   --   --   --  >1.10*  --  >1.10*  CREATININE 2.13*  --   --   --  2.01*  --   --   --   --   TROPONINIHS  --  13 13  --   --   --   --   --   --     Estimated Creatinine Clearance: 29.2 mL/min (A) (by C-G formula based on SCr of 2.01 mg/dL (H)).  Assessment: AC/Heme: New DVT 11/9 by outpatient venous US >> Eliquis starter pack >> UFH no bolus. New chest pain, r/o PE. - Significant h/o thromboembolic dz 6378, 5885 - VQ scan on 02/10/22, which showed no evidence of acute pulmonary embolism. - 03/16/22: OP [place]: DVT - ACD with baseline Hgb 8.5-9.5.  - 11/21: VQ scan suspicious for new bilateral pulmonary emboli.  - Hgb 9. Plts 242, INR 1.8 from Eliquis - aPTT 69 in goal with  HL still elevated from Eliquis>>resume Eliquis 11/21  Goal of Therapy:  aPTT 66-102 seconds Monitor platelets by anticoagulation protocol: Yes   Plan:  Heparin 1200 units/hr>>d/c heparin and resume Eliquis '5mg'$  BID Pharmacy will sign off. Please reconsult for further dosing assitance.   Daryle Amis S. Alford Highland, PharmD, BCPS Clinical Staff Pharmacist [email or web address] Alford Highland, [place] 03/28/2022,8:08 AM

## 2022-03-28 NOTE — Progress Notes (Signed)
[facility] Progress Note Patient: [name] OFB:[id number] DOB: Dec 14, 1934 DOA: 03/26/2022  DOS: the patient was seen and examined on 03/28/2022  Brief hospital course: PMH of CKD 3B, chronic HFpEF, HTN, COPD, type II DM, HLD, obesity, GERD, dementia, anemia of chronic CKD, breast cancer. Hospitalized on 7/21 for acute on chronic CHF and AKI on CKD 3B. Hospitalized in 10/23 for pneumobilia of unclear etiology Seen in the ER on 11/9 for a positive DVT study outpatient and was discharged home on oral Eliquis. Referred to vascular surgery on 11/14 none recommendation was for 3 to 6 months of anticoagulation without any planned intervention. Patient was on aspirin and Plavix.  Aspirin was stopped.  Plavix was continued. Called vascular surgery on 11/18 with reports of dark stool.  Was recommended to hold Eliquis starting 11/19.  And today on 11/19 continues to have dark stool and was brought to ER for further evaluation. Assessment and Plan: Concern for melena/BRBPR. Patient had 1 episode of BRBPR followed by 2 episodes of melena which led the family to bring the patient to the hospital. Currently Hemoccult x2 is negative. Also H&H relatively stable. Patient does not appear to have any active bleeding based on the evaluation. Started on IV heparin and currently tolerating it well without any bleeding. We will transition to Eliquis.  Advance to heart healthy diet. Monitor overnight for stability of hemoglobin. Currently no indication to consult GI as there is no bleeding.  Acute PE. Acute DVT.  Right femoral vein and right popliteal vein. Patient was hospitalized in October. In November of found to have right femoral vein DVT and was started on Eliquis. VQ scan back then was negative. VQ scan this admission shows evidence of small PE bilaterally. Currently not hypoxic tachycardic or hypotensive. Continue anticoagulation.  Anemia of chronic kidney disease. Patient hemoglobin  stable. Monitor.  Chronic diastolic CHF. Recent echocardiogram showed a EF of 50 to 50%. Normal RV. On torsemide currently on hold. Likely resume tomorrow.  Type 2 diabetes mellitus, uncontrolled with hyperglycemia with long-term insulin use with nephropathy. Continue sliding scale insulin. Monitor   CKD 3B. Renal function stable. Monitor.  HTN. Blood pressure stable. There are some concern for low blood pressure earlier. Currently holding oral antihypertensive regimen.  COPD. Does not appear to have any exacerbation. Monitor.  Anxiety. Continue home regimen.  Goals of care conversation. Patient is DNR/DNI.  Patient does not want a feeding tube.  Patient does not want to go through major surgery.  But would like to be hospitalized for treatable conditions.  Subjective: Had a bowel.  No blood in the stool.  No nausea vomiting no fever no chills.  Physical Exam: General: Appear in mild distress; Cardiovascular: S1 and S2 Present, no Murmur, Respiratory: good respiratory effort, Bilateral Air entry present, CTA, no Crackles, no wheezes Abdomen: Bowel Sound present, Non tender  Extremities: no Pedal edema Neurology: alert and oriented to time, place, and person   Data Reviewed: I have Reviewed nursing notes, Vitals, and Lab results. Since last encounter, pertinent lab results CBC and BMP   . I have ordered test including CBC and BMP  .    Disposition: Status is: Inpatient Remains inpatient appropriate because: Monitor H&H.  SCDs Start: 03/26/22 1931 apixaban (ELIQUIS) tablet 5 mg   Family Communication: Discussed with the order. Level of care: Telemetry Continue telemetry due to presentation of PE and GI bleed.  \ Vitals:   03/27/22 2308 03/28/22 0540 03/28/22 0922 03/28/22 1206  BP: [name])  117/49 (!) 134/54  (!) 132/53  Pulse: 77 68  66  Resp: '17 20  20  '$ Temp: 98.3 F (36.8 C) 97.8 F (36.6 C)  98.1 F (36.7 C)  TempSrc: Oral Oral  Oral  SpO2: 93% 95% 95%  100%  Weight:      Height:         Author: Berle Mull, MD 03/28/2022 6:15 PM  Please look on [email or web address] to find out who is on call.

## 2022-03-29 ENCOUNTER — Telehealth: Payer: Self-pay

## 2022-03-29 ENCOUNTER — Encounter (HOSPITAL_COMMUNITY): Payer: Self-pay | Admitting: Internal Medicine

## 2022-03-29 DIAGNOSIS — K921 Melena: Secondary | ICD-10-CM | POA: Diagnosis not present

## 2022-03-29 DIAGNOSIS — F0390 Unspecified dementia without behavioral disturbance: Secondary | ICD-10-CM

## 2022-03-29 DIAGNOSIS — I5032 Chronic diastolic (congestive) heart failure: Secondary | ICD-10-CM | POA: Diagnosis not present

## 2022-03-29 DIAGNOSIS — I10 Essential (primary) hypertension: Secondary | ICD-10-CM | POA: Diagnosis not present

## 2022-03-29 DIAGNOSIS — I82411 Acute embolism and thrombosis of right femoral vein: Secondary | ICD-10-CM | POA: Diagnosis not present

## 2022-03-29 LAB — BASIC METABOLIC PANEL
Anion gap: 6 (ref 5–15)
BUN: 41 mg/dL — ABNORMAL HIGH (ref 8–23)
CO2: 27 mmol/L (ref 22–32)
Calcium: 8.7 mg/dL — ABNORMAL LOW (ref 8.9–10.3)
Chloride: 110 mmol/L (ref 98–111)
Creatinine, Ser: 2 mg/dL — ABNORMAL HIGH (ref 0.44–1.00)
GFR, Estimated: 24 mL/min — ABNORMAL LOW (ref >60)
Glucose, Bld: 105 mg/dL — ABNORMAL HIGH (ref 70–99)
Potassium: 4.5 mmol/L (ref 3.5–5.1)
Sodium: 143 mmol/L (ref 135–145)

## 2022-03-29 LAB — CBC
HCT: 29.4 % — ABNORMAL LOW (ref 36.0–46.0)
Hemoglobin: 8.6 g/dL — ABNORMAL LOW (ref 12.0–15.0)
MCH: 28.4 pg (ref 26.0–34.0)
MCHC: 29.3 g/dL — ABNORMAL LOW (ref 30.0–36.0)
MCV: 97 fL (ref 80.0–100.0)
Platelets: 235 10*3/uL (ref 150–400)
RBC: 3.03 MIL/uL — ABNORMAL LOW (ref 3.87–5.11)
RDW: 13.6 % (ref 11.5–15.5)
WBC: 10.8 10*3/uL — ABNORMAL HIGH (ref 4.0–10.5)
nRBC: 0 % (ref 0.0–0.2)

## 2022-03-29 LAB — GLUCOSE, CAPILLARY
Glucose-Capillary: 109 mg/dL — ABNORMAL HIGH (ref 70–99)
Glucose-Capillary: 130 mg/dL — ABNORMAL HIGH (ref 70–99)
Glucose-Capillary: 149 mg/dL — ABNORMAL HIGH (ref 70–99)

## 2022-03-29 MED ORDER — PANTOPRAZOLE SODIUM 40 MG PO TBEC
40.0000 mg | DELAYED_RELEASE_TABLET | Freq: Two times a day (BID) | ORAL | Status: DC
  Administered 2022-03-29: 40 mg via ORAL
  Filled 2022-03-29: qty 1

## 2022-03-29 MED ORDER — DICLOFENAC SODIUM 1 % EX GEL
2.0000 g | Freq: Four times a day (QID) | CUTANEOUS | Status: DC
  Filled 2022-03-29 (×2): qty 100

## 2022-03-29 NOTE — Assessment & Plan Note (Signed)
Her blood pressure has been stable with systolic 902 to 409 mmHg.  Plan to continue carvedilol.

## 2022-03-29 NOTE — Telephone Encounter (Signed)
Transition Care Management Follow-up Telephone Call Date of discharge and from where: 03/29/22 [facility] Inpatient. Dx: Melena How have you been since you were released from the hospital? She's doing ok Any questions or concerns? I need home health for someone to come in and give her a bath.  Items Reviewed: Did the pt receive and understand the discharge instructions provided? Yes  Medications obtained and verified? Yes  Other? No  Any new allergies since your discharge? No  Dietary orders reviewed? No Do you have support at home? Yes   Home Care and Equipment/Supplies: Were home health services ordered? not applicable If so, what is the name of the agency? N/a  Has the agency set up a time to come to the patient's home? not applicable Were any new equipment or medical supplies ordered?  Yes: Wheelchair What is the name of the medical supply agency? [facility] Were you able to get the supplies/equipment? Just DC today, not yet Do you have any questions related to the use of the equipment or supplies? No  Functional Questionnaire: (I = Independent and D = Dependent) ADLs: D  Bathing/Dressing- D  Meal Prep- D  Eating- D  Maintaining continence- D  Transferring/Ambulation- D  Managing Meds- D  Follow up appointments reviewed:  PCP Hospital f/u appt confirmed? Yes  Scheduled to see Dr. Grandville Silos on 04/05/22 @ 11:00am. [facility] f/u appt confirmed? No  Scheduled to see n/a on n/a @ n/a. Are transportation arrangements needed? No  If their condition worsens, is the pt aware to call PCP or go to the Emergency Dept.? Yes Was the patient provided with contact information for the PCP's office or ED? Yes Was to pt encouraged to call back with questions or concerns? Yes  Angeline Slim, RN, BSN RN Clinical Supervisor [facility] [employer]

## 2022-03-29 NOTE — Assessment & Plan Note (Signed)
Her renal function has remained stable, at the time of her discharge her serum cr is 2,0 with K at 4,5 and serum bicarbonate at 27. No clinical sings of volume overload. Patient will resume torsemide and once weekly sodium polystyrene  Follow up renal function as outpatient.

## 2022-03-29 NOTE — Plan of Care (Signed)
  Problem: Clinical Measurements: Goal: Will remain free from infection Outcome: Progressing Goal: Diagnostic test results will improve Outcome: Progressing Goal: Respiratory complications will improve Outcome: Progressing Goal: Cardiovascular complication will be avoided Outcome: Progressing   

## 2022-03-29 NOTE — TOC Transition Note (Addendum)
Transition of Care [facility]) - CM/SW Discharge Note   Patient Details  Name: [name] MRN: [id number] Date of Birth: November 28, 1934  Transition of Care [facility]) CM/SW Contact:  Roseanne Kaufman, RN Phone Number: 03/29/2022, 11:34 AM   Clinical Narrative:   Spoke with patient's daughter [name] regarding reclining back wheelchair. [name] indicates patient has an old wheelchair that is not functioning properly. Notified Erasmo Downer with [facility] who will review insurance coverage and plan to deliver to patient's home. Notified MD to update DME order to reflect reclining back wheelchair, awaiting update.  Transportation at discharge: Patient's son  TOC will continue to follow.  - 12:14pm Notified Erasmo Downer with [facility] to advise order and narrative are in the system, family agrees to having wheelchair delivered to home address on file. Spoke with patient's daughter/ POA [name] to advise [facility] will follow up, per [name] she was told at [facility] previously that they did not have wheelchair. This RNCM advised Erasmo Downer with [facility], took referral and did not indicate transport wheelchair is unavailable, will need to deliver. Notified Kristin with [employer] to follow up with [name] (POA).   No additional TOC needs at this time.      Barriers to Discharge: No Barriers Identified   Patient Goals and CMS Choice Patient states their goals for this hospitalization and ongoing recovery are:: to go home CMS Medicare.gov Compare Post Acute Care list provided to:: Patient Choice offered to / list presented to : Patient, Adult Children [name] (daughter))  Discharge Placement                       Discharge Plan and Services In-house Referral: NA Discharge Planning Services: CM Consult Post Acute Care Choice: Durable Medical Equipment          DME Arranged: Other see comment (reclining back wheelchair) DME Agency: [facility] Date DME Agency Contacted: 03/29/22 Time DME Agency Contacted:  1132 Representative spoke with at DME Agency: Erasmo Downer HH Arranged: NA [place] Agency: NA        Social Determinants of Health ([place]) Interventions     Readmission Risk Interventions    11/24/2021   10:03 AM 10/20/2021    3:50 PM 02/18/2020   11:52 AM  Readmission Risk Prevention Plan  Transportation Screening Complete Complete Complete  Medication Review Press photographer) Complete Complete Complete  PCP or Specialist appointment within 3-5 days of discharge Complete Complete Complete  HRI or Home Care Consult Complete Complete Complete  SW Recovery Care/Counseling Consult Complete Complete Complete  Palliative Care Screening Not Applicable Not Applicable Not [place] Not Applicable Not Applicable Not Applicable

## 2022-03-29 NOTE — Assessment & Plan Note (Signed)
No clinical signs of exacerbation, continue bronchodilator therapy.

## 2022-03-29 NOTE — Discharge Summary (Signed)
Physician Discharge Summary   Patient: [name] MRN: [id number] DOB: 05-04-1935  Admit date:     03/26/2022  Discharge date: 03/29/22  Discharge Physician: Jimmy Picket Keandria Berrocal   PCP: Bonnita Hollow, MD   Recommendations at discharge:    Patient will continue taking apixaban for bilateral pulmonary embolism Continue close follow up with Dr Grandville Silos. Follow cell count as outpatient   I spoke with patient's daughter over the phone, we talked in detail about patient's condition, plan of care and prognosis and all questions were addressed.   Discharge Diagnoses: Principal Problem:   Melena Active Problems:   Acute deep vein thrombosis (DVT) of femoral vein of right lower extremity (HCC)   Essential hypertension   Chronic diastolic CHF (congestive heart failure) (HCC)   Stage 3b chronic kidney disease (CKD) (HCC)   DM2 (diabetes mellitus, type 2) (HCC)   COPD (chronic obstructive pulmonary disease) (HCC)   Dementia (HCC)   Class 3 obesity ([place])  Resolved Problems:   * No resolved hospital problems. [facility] Course: Mrs. [name] was admitted to the hospital with the working diagnosis of melena in the setting of chest pain.   87 yo female with the past medical history of CKD 3B, chronic HFpEF, HTN, COPD, type II DM, HLD, obesity, GERD, dementia, anemia of chronic CKD, breast cancer. Hospitalized on 7/21 for acute on chronic CHF and AKI on CKD 3B. Hospitalized in 10/23 for pneumobilia of unclear etiology Seen in the ER on 11/9 for a positive DVT study outpatient and was discharged home on oral Eliquis. Referred to vascular surgery on 11/14 none recommendation was for 3 to 6 months of anticoagulation without any planned intervention. Patient was on aspirin and Plavix.  Aspirin was stopped.  Plavix was continued. Called vascular surgery on 11/18 with reports of dark stool.  Was recommended to hold Eliquis starting 11/19.   At home she had 2 days of intermittent chest  pain left sided, worse with deep inspiration and associated with dyspnea. Prompting her to come to the ED, she did mention dark color stools. On her initial physical examination her blood pressure was 129/41, HR 56, RR 19 and 02 saturation 99%, lungs with no wheezing or rales, heart with S1 and S2 present and rhythmic, abdomen with no distention and no lower extremity edema.   Na 140, K 4,9 CL 102 bicarbonate 27 glucose 276 bun 52 cr 2,13  High sensitive troponin 13 and 13  Wbc 9,6 hgb 9,5 plt 264  INR 2,0   Chest radiograph with right rotation with no infiltrates.  CT abdomen and pelvis with punctate focus of air in the non dependent urinary bladder, suggesting possible infection.  Cholelithiasis without CT findings of acute cholecystitis.  Diverticulosis with no diverticulitis.   EKG 60 bpm, normal axis, normal intervals, sinus rhythm with small q wave lead II and III, no significant ST segment or T wave changes.   Pulmonary V?Q scan with small PE bilaterally.   Patient had no signs of active bleeding, she has placed on heparin IV for anticoagulation.  Hgb remained stable and transitioned to apixaban.   Assessment and Plan: No notes have been filed under this hospital service. Service: Hospitalist        Consultants: none  Procedures performed: none   Disposition: Home Diet recommendation:  Cardiac and Carb modified diet DISCHARGE MEDICATION: Allergies as of 03/29/2022       Reactions   Sulfonamide Derivatives Swelling, Other (See Comments)  Mouth swelling- no resp issues noted   Lokelma [sodium Zirconium Cyclosilicate] Other (See Comments)   Headache, stomach upset   Aricept [donepezil Hcl] Diarrhea, Nausea And Vomiting   GI upset/loose stools   Tramadol Nausea And Vomiting        Medication List     STOP taking these medications    clopidogrel 75 MG tablet Commonly known as: PLAVIX   escitalopram 20 MG tablet Commonly known as: LEXAPRO       TAKE  these medications    albuterol 108 (90 Base) MCG/ACT inhaler Commonly known as: ProAir HFA Inhale 1-2 puffs into the lungs every 6 (six) hours as needed for wheezing or shortness of breath.   ALPRAZolam 0.5 MG tablet Commonly known as: XANAX Take 1 tablet (0.5 mg total) by mouth 3 (three) times daily as needed (vertigo).   anastrozole 1 MG tablet Commonly known as: ARIMIDEX TAKE 1 TABLET BY MOUTH EVERY DAY   atorvastatin 80 MG tablet Commonly known as: LIPITOR TAKE 1 TABLET BY MOUTH EVERY DAY   budesonide-formoterol 160-4.5 MCG/ACT inhaler Commonly known as: SYMBICORT Inhale 2 puffs into the lungs 2 (two) times daily.   carvedilol 12.5 MG tablet Commonly known as: COREG TAKE 1 TABLET BY MOUTH 2 TIMES DAILY What changed: when to take this   cetirizine 10 MG tablet Commonly known as: ZYRTEC Take 10 mg by mouth daily as needed for allergies or rhinitis.   diclofenac Sodium 1 % Gel Commonly known as: Voltaren Apply 2 g topically 4 (four) times daily. What changed:  when to take this reasons to take this   Eliquis DVT/PE Starter Pack Generic drug: Apixaban Starter Pack ('10mg'$  and '5mg'$ ) Take as directed on package: start with 2 (two)-'5mg'$  tablets twice daily for 7 days. On day 8, switch to 1 (one)-'5mg'$  tablet twice daily. What changed:  how much to take how to take this when to take this additional instructions   febuxostat 40 MG tablet Commonly known as: ULORIC TAKE 1 TABLET BY MOUTH EVERY DAY   fluticasone 50 MCG/ACT nasal spray Commonly known as: FLONASE Place 1 spray into both nostrils in the morning and at bedtime. What changed:  when to take this reasons to take this   folic acid 1 MG tablet Commonly known as: FOLVITE TAKE 1 TABLET BY MOUTH EVERY DAY   FreeStyle Libre 14 Day Sensor Misc PLACE 1 DEVICE ON THE SKIN AS DIRECTED EVERY 14 DAYS   guaifenesin 100 MG/5ML syrup Commonly known as: ROBITUSSIN Take 200 mg by mouth 3 (three) times daily as needed for  cough.   linaclotide 145 MCG Caps capsule Commonly known as: LINZESS Take 1 capsule (145 mcg total) by mouth daily before breakfast.   meclizine 25 MG tablet Commonly known as: ANTIVERT TAKE 1 TABLET BY MOUTH THREE TIMES DAILY AS NEEDED FOR DIZZINESS What changed: See the new instructions.   memantine 5 MG tablet Commonly known as: NAMENDA TAKE 2 TABLETS BY MOUTH EVERY DAY and TAKE 1 TABLET BY MOUTH EVERY EVENING What changed:  how much to take how to take this when to take this   nitroGLYCERIN 0.4 MG SL tablet Commonly known as: Nitrostat Take one tablet under the tongue every 5 minutes as needed for chest pain What changed:  how much to take how to take this when to take this reasons to take this additional instructions   NovoLOG FlexPen 100 UNIT/ML FlexPen Generic drug: insulin aspart Inject 14 Units into the skin 3 (three) times daily  before meals.   nystatin cream Commonly known as: MYCOSTATIN Apply 1 Application topically 2 (two) times daily.   ondansetron 4 MG tablet Commonly known as: Zofran Take 1 tablet (4 mg total) by mouth every 8 (eight) hours as needed for nausea or vomiting.   oxyCODONE-acetaminophen 7.5-325 MG tablet Commonly known as: PERCOCET Take 1 tablet by mouth every 12 (twelve) hours. Start taking on: May 30, 2022   pantoprazole 20 MG tablet Commonly known as: PROTONIX TAKE 1 TABLET BY MOUTH EVERY DAY   sodium polystyrene 15 GM/60ML suspension Commonly known as: SPS take 105ms by MOUTH ONCE WEEKLY. TO keep potassium down What changed:  how much to take how to take this when to take this additional instructions   torsemide 20 MG tablet Commonly known as: DEMADEX TAKE 1 TABLET BY MOUTH EVERY DAY Take an extra dose for weight gain, 3lb in 1 day or 5lbs in 1 week What changed: See the new instructions.   Toujeo Max SoloStar 300 UNIT/ML Solostar Pen Generic drug: insulin glargine (2 Unit Dial) Inject 42 Units into the skin daily.  Patient assistance program provides   traZODone 50 MG tablet Commonly known as: DESYREL Take 0.5 tablets (25 mg total) by mouth at bedtime. What changed: how much to take   Vitamin D (Ergocalciferol) 1.25 MG (50000 UNIT) Caps capsule Commonly known as: DRISDOL TAKE 1 CAPSULE BY MOUTH every 7 days        Discharge Exam: Filed Weights   03/26/22 1336 03/29/22 0403  Weight: 133.8 kg 131.6 kg   BP 121/63 (BP Location: Right Arm)   Pulse 76   Temp 98 F (36.7 C) (Oral)   Resp 18   Ht '5\' 7"'$  (1.702 m)   Wt 131.6 kg   SpO2 94%   BMI 45.44 kg/m   Patient with left shoulder pain with no chest pain or dyspnea. Left shoulder pain worse to touch and movement.   Neurology awake and alert ENT with mild pallor, no icterus Cardiovascular with S1 and S2 present and rhythmic with no gallops, rubs or murmurs Respiratory with no rales or wheezing Abdomen protuberant but not tender No lower extremity edema    Condition at discharge: stable  The results of significant diagnostics from this hospitalization (including imaging, microbiology, ancillary and laboratory) are listed below for reference.   Imaging Studies: NM Pulmonary Perf and Vent  Result Date: 03/27/2022 CLINICAL DATA:  Pulmonary embolism (PE) suspected, high prob (pt recently dx'ed with acute RLE DVT, now with concerns for PE while also presenting with new melena) EXAM: NUCLEAR MEDICINE PERFUSION LUNG SCAN TECHNIQUE: Perfusion images were obtained in multiple projections after intravenous injection of radiopharmaceutical. Ventilation scans intentionally deferred if perfusion scan and chest x-ray adequate for interpretation during COVID 19 epidemic. RADIOPHARMACEUTICALS:  3.62 mCi Tc-928mAA IV COMPARISON:  Radiograph yesterday. Perfusion scan 02/10/2022, 07/05/2021 FINDINGS: There are small wedge-shaped defects within the periphery of both lungs that were not seen on prior exam. Background perfusion is heterogeneous. IMPRESSION:  Small wedge-shaped perfusion defects within both lungs are new from prior exam and suspicious for bilateral pulmonary emboli. These results will be called to the ordering clinician or representative by the Radiologist Assistant, and communication documented in the PACS or ClFrontier Oil CorporationElectronically Signed   By: MeKeith Rake.D.   On: 03/27/2022 16:10   CT ABDOMEN PELVIS WO CONTRAST  Result Date: 03/26/2022 CLINICAL DATA:  Acute abdominal pain. EXAM: CT ABDOMEN AND PELVIS WITHOUT CONTRAST TECHNIQUE: Multidetector CT imaging of  the abdomen and pelvis was performed following the standard protocol without IV contrast. RADIATION DOSE REDUCTION: This exam was performed according to the departmental dose-optimization program which includes automated exposure control, adjustment of the mA and/or kV according to patient size and/or use of iterative reconstruction technique. COMPARISON:  02/08/2022 FINDINGS: Lower chest: Clear lung bases. Stents of coronary artery calcifications or stents. Hepatobiliary: No focal liver abnormality on this unenhanced exam. Small gallstones without pericholecystic inflammation. The previous pneumobilia is no longer seen. The previous air in the gallbladder is not seen. No biliary dilatation. No visualized choledocholithiasis. Pancreas: No ductal dilatation or inflammation. Spleen: Moderate lumbar degenerative change. Adrenals/Urinary Tract: No adrenal nodule. Bilateral renal cysts. No imaging follow-up is recommended. No hydronephrosis or renal calculi. No suspicious renal lesion. Decompressed ureters without ureteral stones. Punctate focus of air in the nondependent bladder. Stomach/Bowel: Small hiatal hernia with surgical clips at the gastroesophageal junction. Enteric sutures within small bowel in the left abdomen. Small bowel adjacent to the sutures is patulous, however noninflamed. No obstruction. Appendectomy per history. Small volume of colonic stool. Minimal sigmoid  diverticulosis without diverticulitis. No colonic inflammation. Vascular/Lymphatic: Aortic and branch atherosclerosis. No aneurysm. No bulky adenopathy. Reproductive: Subtle uterine fibroids. No adnexal mass. Other: Postsurgical change of the anterior abdominal wall, diminutive fat containing umbilical hernia. No ascites or free air. Musculoskeletal: Lumbar and hip degenerative change. No acute osseous findings. IMPRESSION: 1. Punctate focus of air in the nondependent bladder, typically due to recent instrumentation, however can be seen with urinary tract infection. Recommend correlation with urinalysis. 2. Cholelithiasis without CT findings of acute cholecystitis. The previous pneumobilia and air in the gallbladder has resolved. 3. Minimal sigmoid diverticulosis without diverticulitis. Aortic Atherosclerosis (ICD10-I70.0). Electronically Signed   By: Keith Rake M.D.   On: 03/26/2022 17:48   DG Chest Portable 1 View  Result Date: 03/26/2022 CLINICAL DATA:  Recent diagnosis with DVT. EXAM: PORTABLE CHEST 1 VIEW COMPARISON:  February 08, 2022 FINDINGS: Mild cardiomegaly. The hila and mediastinum are unchanged. No pneumothorax. No nodules or masses. No focal infiltrates. IMPRESSION: No active disease. Electronically Signed   By: Dorise Bullion III M.D.   On: 03/26/2022 15:33   VAS [place] LOWER EXTREMITY VENOUS (DVT)  Result Date: 03/16/2022  Lower Venous DVT Study Patient Name:  ADELENE POLIVKA  Date of Exam:   03/16/2022 Medical Rec #: [id number]        Accession #:    [id number] Date of Birth: 04/27/1935        Patient Gender: F Patient Age:   87 years Exam Location:  [facility] Procedure:      VAS [place] LOWER EXTREMITY VENOUS (DVT) Referring Phys: Josephine Igo --------------------------------------------------------------------------------  Indications: Right calf swelling x 3 weeks. Patient noticed increasing SOB over the last 3 weeks. She denies chest pains.  Anticoagulation: Plavix and aspirin only.  Comparison       Bilateral leg venous duplex done 03/03/2020 was negative for Study:           DVT. Performing Technologist: Salvadore Dom RVT, RDCS (AE), RDMS  Examination Guidelines: A complete evaluation includes B-mode imaging, spectral Doppler, color Doppler, and power Doppler as needed of all accessible portions of each vessel. Bilateral testing is considered an integral part of a complete examination. Limited examinations for reoccurring indications may be performed as noted. The reflux portion of the exam is performed with the patient in reverse Trendelenburg.  +---------+---------------+---------+-----------+----------+--------------+ RIGHT    CompressibilityPhasicitySpontaneityPropertiesThrombus Aging +---------+---------------+---------+-----------+----------+--------------+ CFV      Full  Yes      Yes                                 +---------+---------------+---------+-----------+----------+--------------+ SFJ      Full           Yes      Yes                                 +---------+---------------+---------+-----------+----------+--------------+ FV Prox  Partial        Yes      No                                  +---------+---------------+---------+-----------+----------+--------------+ FV Mid   Partial        Yes      No                                  +---------+---------------+---------+-----------+----------+--------------+ FV DistalPartial        Yes      No                                  +---------+---------------+---------+-----------+----------+--------------+ PFV      Full                                                        +---------+---------------+---------+-----------+----------+--------------+ POP      Partial        Yes      No                                  +---------+---------------+---------+-----------+----------+--------------+ PTV      Full           Yes      Yes                                  +---------+---------------+---------+-----------+----------+--------------+ PERO     Full           Yes      Yes                                 +---------+---------------+---------+-----------+----------+--------------+ Gastroc  Full                                                        +---------+---------------+---------+-----------+----------+--------------+ GSV      Full           Yes      Yes                                 +---------+---------------+---------+-----------+----------+--------------+   +----+---------------+---------+-----------+----------+--------------+  LEFTCompressibilityPhasicitySpontaneityPropertiesThrombus Aging +----+---------------+---------+-----------+----------+--------------+ CFV Full           Yes      Yes                                 +----+---------------+---------+-----------+----------+--------------+    Findings reported to Asencion Partridge, RN at Dr. Ellison Carwin office, Lenise Herald, RN and Rebbeca Paul at 1:30 pm . Patient referred to ER.  Summary: RIGHT: - Findings consistent with acute deep vein thrombosis involving the right femoral vein, and right popliteal vein.  LEFT: - No evidence of common femoral vein obstruction.  *See table(s) above for measurements and observations. Electronically signed by Harold Barban MD on 03/16/2022 at 8:58:17 PM.    Final     Microbiology: Results for orders placed or performed during the hospital encounter of 03/26/22  MRSA Next Gen by PCR, Nasal     Status: None   Collection Time: 03/26/22 10:00 PM   Specimen: Nasal Mucosa; Nasal Swab  Result Value Ref Range Status   MRSA by PCR Next Gen NOT DETECTED NOT DETECTED Final    Comment: (NOTE) The GeneXpert MRSA Assay (FDA approved for NASAL specimens only), is one component of a comprehensive MRSA colonization surveillance program. It is not intended to diagnose MRSA infection nor to guide or monitor treatment for MRSA infections. Test performance is not  FDA approved in patients less than 51 years old. Performed at [facility], [place] [address], [place], [place] [address]    *Note: Due to a large number of results and/or encounters for the requested time period, some results have not been displayed. A complete set of results can be found in Results Review.    Labs: CBC: Recent Labs  Lab 03/26/22 1340 03/26/22 1815 03/26/22 2135 03/27/22 0035 03/27/22 1000 03/28/22 0725 03/29/22 0458  WBC 9.6  --  9.9 10.3  --  10.0 10.8*  NEUTROABS  --   --   --  6.2  --   --   --   HGB 9.5*   < > 9.4* 8.7* 8.7* 9.0* 8.6*  HCT 31.4*   < > 31.1* 28.2* 29.0* 29.9* 29.4*  MCV 94.6  --  96.6 94.3  --  95.5 97.0  PLT 264  --  263 253  --  242 235   < > = values in this interval not displayed.   Basic Metabolic Panel: Recent Labs  Lab 03/26/22 1340 03/27/22 0035 03/29/22 0458  NA 140 144 143  K 4.9 4.8 4.5  CL 102 108 110  CO2 '27 28 27  '$ GLUCOSE 276* 122* 105*  BUN 52* 53* 41*  CREATININE 2.13* 2.01* 2.00*  CALCIUM 8.8* 8.4* 8.7*  MG  --  1.2*  --   PHOS  --  3.8  --    Liver Function Tests: Recent Labs  Lab 03/26/22 1340 03/27/22 0035  AST 11* 14*  ALT 8 10  ALKPHOS 81 81  BILITOT 0.5 0.7  PROT 7.4 7.2  ALBUMIN 3.5 3.0*   CBG: Recent Labs  Lab 03/28/22 1203 03/28/22 1759 03/29/22 0007 03/29/22 0620 03/29/22 0735  GLUCAP 95 182* 149* 109* 130*    Discharge time spent: greater than 30 minutes.  Signed: Tawni Millers, MD [facility] 03/29/2022

## 2022-03-29 NOTE — Assessment & Plan Note (Addendum)
Patient will continue home regimen of insulin.  During her hospitalization she had uncontrolled hyperglycemia up to 276 that corrected with insulin therapy. At the time of her discharge her fasting glucose is 105 mg/dl.   Continue statin therapy for dyslipidemia

## 2022-03-29 NOTE — Assessment & Plan Note (Addendum)
Patient had no signs of significant gastrointestinal bleed. She tolerated well anticoagulation with heparin IV and then transitioned to oral anticoagulation. Her follow up hgb is 8.6 from a baseline of 9,0 to 8,7  Risk vs benefit, plan to continue with full anticoagulation.  GI ruled out.

## 2022-03-29 NOTE — Progress Notes (Signed)
Patient needs a reclining back/transport wheelchair due to the following diagnosis: Obesity class 3 and poor mobility.    Kathreen Cornfield, BSN, RN, CCM [phone]    Durable Medical Equipment  (From admission, onward)           Start     Ordered   03/29/22 1144  For home use only DME standard manual wheelchair with seat cushion  Once       Comments: Patient suffers from ambulatory dysfunction which impairs their ability to perform daily activities like toileting in the home.  A walker will not resolve issue with performing activities of daily living. A wheelchair will allow patient to safely perform daily activities. Patient can safely propel the wheelchair in the home or has a caregiver who can provide assistance. Length of need 12 months . Accessories: elevating leg rests (ELRs), wheel locks, extensions and anti-tippers.  Transport wheelchair. Patient needs a reclining back wheelchair due to : obesity class 3 and poor mobility.   03/29/22 1144

## 2022-03-29 NOTE — Assessment & Plan Note (Signed)
Patient with no signs of heart failure exacerbation During her hospitalization torsemide was held, but will be continued at the time of her discharge. Continue with carvedilol.  Follow up as outpatient

## 2022-03-29 NOTE — Assessment & Plan Note (Addendum)
Continue with memantine.

## 2022-03-29 NOTE — Assessment & Plan Note (Signed)
03/16/22 positive deep vein thrombosis in the right femoral and right popliteal vein   Patient was placed transitory on IV heparin with no complications of bleeding. Further work up with pulmonary VQ scan showed small wedge shaped perfusion defects within both lungs that are new from prior examinations, suggesting bilateral pulmonary emboli.  Patient is wheelchair bound at home, and has a high risk for recurrent deep venus thromboembolism, plan to continue anticoagulation with apixaban.

## 2022-03-31 ENCOUNTER — Other Ambulatory Visit: Payer: Self-pay | Admitting: Hematology

## 2022-03-31 ENCOUNTER — Other Ambulatory Visit: Payer: Self-pay | Admitting: Nurse Practitioner

## 2022-04-02 ENCOUNTER — Other Ambulatory Visit: Payer: Self-pay | Admitting: Internal Medicine

## 2022-04-03 ENCOUNTER — Telehealth: Payer: Self-pay

## 2022-04-03 NOTE — Telephone Encounter (Signed)
Please advise, see below.   [name] from [facility] called in to advise that 290 lbs is patient's current weight    Would like a verbal order for parameter weight of 285-295 lbs and PT twice a  week for 1 Week and once a week for week 2. Re-certify in 3 weeks

## 2022-04-03 NOTE — Telephone Encounter (Signed)
Left [place] a detailed voice message regarding verbal orders and that they were approved by Dr. Grandville Silos

## 2022-04-04 ENCOUNTER — Telehealth: Payer: Self-pay

## 2022-04-04 ENCOUNTER — Other Ambulatory Visit: Payer: Self-pay | Admitting: Family Medicine

## 2022-04-04 NOTE — Progress Notes (Signed)
  Chronic Care Management   Note  04/04/2022 Name: [name] MRN: [id number] DOB: Jul 12, 1934  [name] is a 87 y.o. year old female who is a primary care patient of Bonnita Hollow, MD. I reached out to [name] by phone today in response to a referral sent by Ms. Douds PCP.  Ms. GLENDIA OLSHEFSKI  agreedto scheduling an appointment with the CCM RN Case Manager   Follow up plan: Patient agreed to scheduled appointment with RN Case Manager on 04/20/2022(date/time).   Noreene Larsson, [place], [place] [address] Direct Dial: [phone] [email or web address]

## 2022-04-04 NOTE — Progress Notes (Signed)
  Chronic Care Management   Note  04/04/2022 Name: [name] MRN: [id number] DOB: March 06, 1935  [name] is a 87 y.o. year old female who is a primary care patient of Bonnita Hollow, MD. I reached out to [name] by phone today in response to a referral sent by [name] PCP.  [name] was not successfully contacted today. A HIPAA compliant voice message was left requesting a return call.   Follow up plan: Additional outreach attempts will be made.  Noreene Larsson, [place], [place] [address] Direct Dial: [phone] [email or web address] [email or web address]

## 2022-04-04 NOTE — Telephone Encounter (Signed)
[name], Production assistant, radio with [employer] called in regarding a prior authorization request for custodial care benefits for patient that was faxed. I advised her we haven't received the fax. She walked me through how to download the form from the website. I advised her I would pass it along to the provider for review. Family requested [employer] in [place].   [name], RN also wanted to notate that patient passed the animal test on 03/10/2022 recognizing 17 animals.

## 2022-04-05 ENCOUNTER — Ambulatory Visit (INDEPENDENT_AMBULATORY_CARE_PROVIDER_SITE_OTHER): Payer: PPO | Admitting: Family Medicine

## 2022-04-05 ENCOUNTER — Encounter (HOSPITAL_COMMUNITY): Payer: Self-pay

## 2022-04-05 ENCOUNTER — Other Ambulatory Visit (HOSPITAL_BASED_OUTPATIENT_CLINIC_OR_DEPARTMENT_OTHER): Payer: Self-pay

## 2022-04-05 ENCOUNTER — Encounter: Payer: Self-pay | Admitting: Family Medicine

## 2022-04-05 ENCOUNTER — Encounter: Payer: Self-pay | Admitting: Hematology

## 2022-04-05 ENCOUNTER — Telehealth: Payer: Self-pay | Admitting: Family Medicine

## 2022-04-05 VITALS — BP 132/78 | HR 79 | Wt 290.0 lb

## 2022-04-05 DIAGNOSIS — E785 Hyperlipidemia, unspecified: Secondary | ICD-10-CM | POA: Diagnosis not present

## 2022-04-05 DIAGNOSIS — R42 Dizziness and giddiness: Secondary | ICD-10-CM | POA: Diagnosis not present

## 2022-04-05 DIAGNOSIS — E162 Hypoglycemia, unspecified: Secondary | ICD-10-CM | POA: Diagnosis not present

## 2022-04-05 DIAGNOSIS — E1142 Type 2 diabetes mellitus with diabetic polyneuropathy: Secondary | ICD-10-CM | POA: Insufficient documentation

## 2022-04-05 DIAGNOSIS — I2699 Other pulmonary embolism without acute cor pulmonale: Secondary | ICD-10-CM | POA: Diagnosis not present

## 2022-04-05 DIAGNOSIS — E1169 Type 2 diabetes mellitus with other specified complication: Secondary | ICD-10-CM | POA: Diagnosis not present

## 2022-04-05 DIAGNOSIS — G4700 Insomnia, unspecified: Secondary | ICD-10-CM | POA: Diagnosis not present

## 2022-04-05 LAB — GLUCOSE, POCT (MANUAL RESULT ENTRY): POC Glucose: 44 mg/dl — AB (ref 70–99)

## 2022-04-05 MED ORDER — BELSOMRA 5 MG PO TABS: 5.0000 mg | ORAL_TABLET | Freq: Every evening | ORAL | 0 refills | Status: DC | PRN

## 2022-04-05 MED ORDER — GLUCOSE 4 G PO CHEW: 1.0000 | CHEWABLE_TABLET | ORAL | 12 refills | Status: DC | PRN

## 2022-04-05 MED ORDER — DULOXETINE HCL 30 MG PO CPEP: 30.0000 mg | ORAL_CAPSULE | Freq: Every day | ORAL | 0 refills | Status: DC

## 2022-04-05 MED ORDER — APIXABAN 5 MG PO TABS: 5.0000 mg | ORAL_TABLET | Freq: Two times a day (BID) | ORAL | 3 refills | Status: DC

## 2022-04-05 MED ORDER — GLUCAGON EMERGENCY 1 MG IJ KIT: PACK | INTRAMUSCULAR | 0 refills | Status: DC

## 2022-04-05 NOTE — Telephone Encounter (Signed)
Form completed and faxed to [employer].

## 2022-04-05 NOTE — Patient Instructions (Addendum)
For pulmonary embolism, we refilled your apixaban.  For insomnia, stop trazodone. Start suvorexant.  For low blood sugar, if you have not eaten, do not give novolog. I prescribed glucose tablets and glucagon injection kit to use for emergencies.  For peripheral neuropathy, start duloxetine.

## 2022-04-05 NOTE — Telephone Encounter (Signed)
Patient was seen in office today and cymbalta was refilled at preferred pharmacy  [place], [place] - [address], [place] [address] Phone: [phone]  Fax: [phone]

## 2022-04-05 NOTE — Telephone Encounter (Signed)
Caller Name: pt pharmacy- danielle Call back phone #: [phone]   MEDICATION(S):  DULoxetine (CYMBALTA) 30 MG capsule [[id number]] ,  Days of Med Remaining:   Has the patient contacted their pharmacy (YES/NO)? no What did pharmacy advise?   Preferred Pharmacy:  [email or web address] [address], [place], [place] [address]  ~3.1 mi [phone]  ~~~Please advise patient/caregiver to allow 2-3 business days to process RX refills.

## 2022-04-05 NOTE — Progress Notes (Signed)
Assessment/Plan:   Problem List Items Addressed This Visit       Endocrine   Type 2 diabetes mellitus with hyperlipidemia (HCC)   Relevant Medications   glucose 4 GM chewable tablet   Glucagon, rDNA, (GLUCAGON EMERGENCY) 1 MG KIT   apixaban (ELIQUIS) 5 MG TABS tablet   Diabetic peripheral neuropathy associated with type 2 diabetes mellitus ([place]) - Primary    Recommend against gabapentin joints due to respiratory depressant effects, recommend against tricyclic's given history of significant cardiac disease Trial Cymbalta and monitor blood pressure closely      Relevant Medications   DULoxetine (CYMBALTA) 30 MG capsule   Suvorexant (BELSOMRA) 5 MG TABS   glucose 4 GM chewable tablet   Glucagon, rDNA, (GLUCAGON EMERGENCY) 1 MG KIT   Hypoglycemia    Secondary to insulin dosing without p.o. Did have improvement status post p.o. glucose tablets and crackers plus juice Patient prescribed glucagon and glucose tablets Counseled on not getting insulin dosing without eating Recommend patient follow-up with endocrinology for further management      Relevant Medications   glucose 4 GM chewable tablet   Glucagon, rDNA, (GLUCAGON EMERGENCY) 1 MG KIT     Other   Insomnia (Chronic)    No improvement with trazodone or melatonin Recommend trial of low-dose Belsomra as needed Patient follow-up      Relevant Medications   Suvorexant (BELSOMRA) 5 MG TABS   Other Visit Diagnoses     Dizziness       Relevant Orders   POCT Glucose (CBG) (Completed)   Other acute pulmonary embolism without acute cor pulmonale (HCC)       Relevant Medications   apixaban (ELIQUIS) 5 MG TABS tablet          Subjective:  HPI:  FLEETA KUNDE is a 87 y.o. female who has Hyperlipidemia; Class 3 obesity ([place]); Microcytic anemia; TENSION HEADACHE; Essential hypertension; Chronic diastolic CHF (congestive heart failure) ([place]); Long term current use of anticoagulant therapy; Insomnia; Atypical chest  pain; Estrogen deficiency; COPD with chronic bronchitis; CN (constipation); Gout; Acute kidney injury superimposed on chronic kidney disease ([place]); Vitamin D deficiency; Endometrial polyp; GERD with esophagitis; Dementia ([place]); Type 2 diabetes mellitus with hyperlipidemia ([place]); Moderate nonproliferative diabetic retinopathy of both eyes without macular edema associated with type 2 diabetes mellitus ([place]); Chronic radicular lumbar pain; Lumbar spondylosis; Spinal stenosis, lumbar region, with neurogenic claudication; Bilateral primary osteoarthritis of knee; Iron deficiency anemia; Right elbow pain; Chronic pain of right hand; Breast cancer ([place]); Anxiety; Acute on chronic diastolic (congestive) heart failure ([place]); AKI (acute kidney injury) ([address]); Severe episode of recurrent major depressive disorder, without psychotic features ([place]); Pneumobilia; Dermatosis papulosa nigra; Positive D dimer; CKD (chronic kidney disease), stage IV ([place]); Type 2 diabetes mellitus with stage 4 chronic kidney disease, with long-term current use of insulin ([place]); Type 2 diabetes mellitus with diabetic neuropathy, with long-term current use of insulin ([place]); DM2 (diabetes mellitus, type 2) ([place]); Acute upper GI bleed; Melena; Acute deep vein thrombosis (DVT) of femoral vein of right lower extremity ([place]); Stage 3b chronic kidney disease (CKD) ([place]); COPD (chronic obstructive pulmonary disease) ([place]); History of anemia due to chronic kidney disease; GAD (generalized anxiety disorder); Allergic rhinitis; GI bleed; Diabetic peripheral neuropathy associated with type 2 diabetes mellitus ([place]); and Hypoglycemia on their problem list..   She  has a past medical history of Abdominal pain (01/26/2012), Acute kidney injury superimposed on chronic kidney disease ([place]) (12/18/2014), Allergy, Anemia, unspecified, Anxiety, Anxiety associated with  depression (06/08/2017), Arthritis, Breast cancer ([place]), Breast cancer screening (02/19/2014), Breast  mass (10/27/2014), Chronic diastolic CHF (congestive heart failure) ([place]), Chronic pain syndrome (02/06/2015), CKD (chronic kidney disease), stage III ([place]), Coronary artery disease, Debility (08/01/2012), Dementia ([place]), Depression, Diabetes mellitus, DOE (dyspnea on exertion) (06/24/2014), Dysphagia, unspecified(787.20) (07/31/2012), Fungal dermatitis (11/13/2007), GERD (gastroesophageal reflux disease), Gout, unspecified, Headache, History of blood clots, History of deep venous thrombosis (01/27/2012), History of pulmonary embolus (PE) (04/22/2014), Hyperkalemia (07/26/2017), Hyperlipidemia, Hypertension, Insomnia, Joint pain, Joint swelling, Morbid obesity ([place]), Multiple benign nevi (11/15/2016), Myocardial infarction ([place]) (2004), Non-STEMI (non-ST elevated myocardial infarction) ([place]) (02/15/2020), Numbness, Obstructive sleep apnea, Osteoarthritis, Osteoarthritis, Osteoarthrosis, unspecified whether generalized or localized, lower leg, Pain in the chest (12/31/2013), Pain, chronic, Personal history of other diseases of the digestive system (12/03/2006), Polymyalgia rheumatica ([place]), Presence of stent in right coronary artery, Pulmonary emboli ([place]) (01/2012), Situational depression (06/04/2009), Syncope, vasovagal (07/04/2021), Type 2 diabetes mellitus with hyperlipidemia ([place]) (02/16/2022), Urinary incontinence, Vaginitis and vulvovaginitis (06/23/2016), Vertigo, and Wears dentures..   She presents with chief complaint of Hospitalization Follow-up (Hand and feet numbness. Would like to discuss xanax ) .  Hospital Follow-Up.  Patient recently hospitalized for lower GI bleed while on apixaban as well as found to have pulmonary embolism.  Of note patient recently found to have DVT and was started on apixaban before hospitalization.  Patient has followed with vascular surgery.  She was also evaluated by GI during hospitalization.  Patient and her antiplatelet therapy modified to Plavix only and continuation  of Eliquis.  She was also started on high-dose PPIs for lower GI bleed.  She has had resolution of melena.  She reports that since discharge she is not having any chest pain or shortness of breath.  Diabetic peripheral neuropathy.  Patient complains of ongoing bilateral numbness hand and feet numbness.  She is previously been on gabapentin, but this was discontinued, but for recent patient does not remember.  Likely related to complexity of patient's chronic medical conditions and risk of side effects associated with polypharmacy.  Hypoglycemia.  During visit patient reported episode of dizziness.  She had no loss of consciousness.  Point-of-care CBG was 44.  Patient was given crackers, grape juice, glucose tablets.  Patient reported that her symptoms are improved.  CBG was rechecked and it was 64.  Patient reports that she has been given a dose of NovoLog 14 units this morning, but had not eaten today.  Insomnia.  Patient reports difficulty with sleeping.  She has tried trazodone and melatonin without improvement.  She reports that she wakes up in the middle of the night feeling anxious being able to sleep.  Past Surgical History:  Procedure Laterality Date   ABDOMINAL HYSTERECTOMY     partial   APPENDECTOMY     blood clots/legs and lungs  2013   BREAST BIOPSY Left 07/22/2014   BREAST BIOPSY Left 02/10/2013   BREAST LUMPECTOMY Left 11/05/2014   BREAST LUMPECTOMY WITH RADIOACTIVE SEED LOCALIZATION Left 11/05/2014   Procedure: LEFT BREAST LUMPECTOMY WITH RADIOACTIVE SEED LOCALIZATION;  Surgeon: [name], MD;  Location: [facility];  Service: General;  Laterality: Left;   CARDIAC CATHETERIZATION     COLONOSCOPY     CORONARY ANGIOPLASTY  2   CORONARY STENT INTERVENTION N/A 02/17/2020   Procedure: CORONARY STENT INTERVENTION;  Surgeon: Leonie Man, MD;  Location: [facility];  Service: Cardiovascular;  Laterality: N/A;   ESOPHAGOGASTRODUODENOSCOPY (EGD) WITH PROPOFOL N/A 11/07/2016    Procedure: ESOPHAGOGASTRODUODENOSCOPY (EGD) WITH PROPOFOL;  Surgeon: Gatha Mayer, MD;  Location: [facility];  Service: Endoscopy;  Laterality: N/A;   EXCISION OF SKIN TAG Right 11/05/2014   Procedure: EXCISION OF RIGHT EYELID SKIN TAG;  Surgeon: [name], MD;  Location: [facility];  Service: General;  Laterality: Right;   EYE SURGERY Bilateral    cataract    GASTRIC BYPASS  1977    reversed in 1979, [facility] CATH AND CORONARY ANGIOGRAPHY N/A 02/17/2020   Procedure: LEFT HEART CATH AND CORONARY ANGIOGRAPHY;  Surgeon: Leonie Man, MD;  Location: [facility];  Service: Cardiovascular;  Laterality: N/A;   LEFT HEART CATHETERIZATION WITH CORONARY ANGIOGRAM N/A 06/29/2014   Procedure: LEFT HEART CATHETERIZATION WITH CORONARY ANGIOGRAM;  Surgeon: Troy Sine, MD;  Location: [facility];  Service: Cardiovascular;  Laterality: N/A;   MEMBRANE PEEL Right 10/23/2018   Procedure: MEMBRANE PEEL;  Surgeon: Hurman Horn, MD;  Location: [facility];  Service: Ophthalmology;  Laterality: Right;   MI with stent placement  2004   PARS PLANA VITRECTOMY Right 10/23/2018   Procedure: PARS PLANA VITRECTOMY WITH 25 GAUGE;  Surgeon: Hurman Horn, MD;  Location: [facility];  Service: Ophthalmology;  Laterality: Right;    Outpatient Medications Prior to Visit  Medication Sig Dispense Refill   albuterol (PROAIR HFA) 108 (90 Base) MCG/ACT inhaler Inhale 1-2 puffs into the lungs every 6 (six) hours as needed for wheezing or shortness of breath. 6.7 g 1   anastrozole (ARIMIDEX) 1 MG tablet TAKE 1 TABLET BY MOUTH EVERY DAY (Patient taking differently: Take 1 mg by mouth daily.) 28 tablet 1   atorvastatin (LIPITOR) 80 MG tablet TAKE 1 TABLET BY MOUTH EVERY DAY (Patient taking differently: Take 80 mg by mouth daily.) 90 tablet 3   budesonide-formoterol (SYMBICORT) 160-4.5 MCG/ACT inhaler Inhale 2 puffs into the lungs 2 (two) times daily. 1 each 3   carvedilol (COREG) 12.5 MG tablet TAKE 1 TABLET  BY MOUTH 2 TIMES DAILY (Patient taking differently: Take 12.5 mg by mouth in the morning and at bedtime.) 180 tablet 1   cetirizine (ZYRTEC) 10 MG tablet Take 10 mg by mouth daily as needed for allergies or rhinitis.     Continuous Blood Gluc Sensor (FREESTYLE LIBRE 14 DAY SENSOR) MISC PLACE 1 DEVICE ON THE SKIN AS DIRECTED EVERY 14 DAYS 6 each 2   diclofenac Sodium (VOLTAREN) 1 % GEL Apply 2 g topically 4 (four) times daily. (Patient taking differently: Apply 2 g topically 4 (four) times daily as needed (pain).) 150 g 2   febuxostat (ULORIC) 40 MG tablet Take 1 tablet (40 mg total) by mouth daily. 90 tablet 3   fluticasone (FLONASE) 50 MCG/ACT nasal spray Place 1 spray into both nostrils in the morning and at bedtime. (Patient taking differently: Place 1 spray into both nostrils 2 (two) times daily as needed for rhinitis or allergies.) 40.3 mL 3   folic acid (FOLVITE) 1 MG tablet TAKE 1 TABLET BY MOUTH EVERY DAY (Patient taking differently: Take 1 mg by mouth daily.) 90 tablet 1   guaifenesin (ROBITUSSIN) 100 MG/5ML syrup Take 200 mg by mouth 3 (three) times daily as needed for cough.     insulin glargine, 2 Unit Dial, (TOUJEO MAX SOLOSTAR) 300 UNIT/ML Solostar Pen Inject 42 Units into the skin daily. Patient assistance program provides 15 mL 6   linaclotide (LINZESS) 145 MCG CAPS capsule Take 1 capsule (145 mcg total) by mouth daily before breakfast. 30 capsule 2  meclizine (ANTIVERT) 25 MG tablet TAKE 1 TABLET BY MOUTH THREE TIMES DAILY AS NEEDED FOR DIZZINESS (Patient taking differently: Take 25 mg by mouth 3 (three) times daily as needed for nausea or dizziness.) 30 tablet 11   memantine (NAMENDA) 5 MG tablet TAKE 2 TABLETS BY MOUTH EVERY DAY and TAKE 1 TABLET BY MOUTH EVERY EVENING (Patient taking differently: Take 5-10 mg by mouth See admin instructions. TAKE 2 TABLETS BY MOUTH EVERY DAY and TAKE 1 TABLET BY MOUTH EVERY EVENING) 270 tablet 5   nitroGLYCERIN (NITROSTAT) 0.4 MG SL tablet Take one  tablet under the tongue every 5 minutes as needed for chest pain (Patient taking differently: Place 0.4 mg under the tongue every 5 (five) minutes as needed for chest pain.) 50 tablet 0   NOVOLOG FLEXPEN 100 UNIT/ML FlexPen Inject 14 Units into the skin 2 (two) times daily with a meal. Max daily dose 70 units 60 mL 0   nystatin cream (MYCOSTATIN) Apply 1 Application topically 2 (two) times daily. 30 g 3   ondansetron (ZOFRAN) 4 MG tablet Take 1 tablet (4 mg total) by mouth every 8 (eight) hours as needed for nausea or vomiting. 20 tablet 0   [START ON 05/30/2022] oxyCODONE-acetaminophen (PERCOCET) 7.5-325 MG tablet Take 1 tablet by mouth every 12 (twelve) hours. 60 tablet 0   pantoprazole (PROTONIX) 20 MG tablet TAKE 1 TABLET BY MOUTH EVERY DAY 30 tablet 5   sodium polystyrene (SPS) 15 GM/60ML suspension take 68ms by MOUTH ONCE WEEKLY. TO keep potassium down (Patient taking differently: Take 15 g by mouth every 7 (seven) days. To keep potassium down) 240 mL 2   torsemide (DEMADEX) 20 MG tablet TAKE 1 TABLET BY MOUTH EVERY DAY Take an extra dose for weight gain, 3lb in 1 day or 5lbs in 1 week 30 tablet 0   Vitamin D, Ergocalciferol, (DRISDOL) 1.25 MG (50000 UNIT) CAPS capsule TAKE 1 CAPSULE BY MOUTH every 7 days 4 capsule 1   APIXABAN (ELIQUIS) VTE STARTER PACK (10MG AND 5MG) Take as directed on package: start with 2 (two)-522mtablets twice daily for 7 days. On day 8, switch to 1 (one)-59m42mablet twice daily. (Patient taking differently: Take 5 mg by mouth in the morning and at bedtime.) 74 each 0   traZODone (DESYREL) 50 MG tablet Take 0.5 tablets (25 mg total) by mouth at bedtime. (Patient taking differently: Take 50 mg by mouth at bedtime.) 45 tablet 3   ALPRAZolam (XANAX) 0.5 MG tablet Take 1 tablet (0.5 mg total) by mouth 3 (three) times daily as needed (vertigo). (Patient not taking: Reported on 04/05/2022) 20 tablet 0   No facility-administered medications prior to visit.    Family History   Problem Relation Age of Onset   Breast cancer Mother 69 41Heart disease Mother    Throat cancer Father    Hypertension Father    Arthritis Father    Diabetes Father    Arthritis Sister    Obesity Sister    Diabetes Sister    Heart disease Cousin    Colon cancer Neg Hx    Stomach cancer Neg Hx    Esophageal cancer Neg Hx     Social History   Socioeconomic History   Marital status: Widowed    Spouse name: Not on file   Number of children: 3   Years of education: Not on file   Highest education level: High school graduate  Occupational History   Occupation: retired  Tobacco Use  Smoking status: Never    Passive exposure: Past   Smokeless tobacco: Never   Tobacco comments:    Both parents smoked, patient was exposed/ "Raised up in smoke, my whole life."  Vaping Use   Vaping Use: Never used  Substance and Sexual Activity   Alcohol use: No    Alcohol/week: 0.0 standard drinks of alcohol   Drug use: No   Sexual activity: Not Currently  Other Topics Concern   Not on file  Social History Narrative   Not on file   Social Determinants of Health   Financial Resource Strain: Low Risk  (10/14/2021)   Overall Financial Resource Strain (CARDIA)    Difficulty of Paying Living Expenses: Not very hard  Food Insecurity: No Food Insecurity (03/29/2022)   Hunger Vital Sign    Worried About Running Out of Food in the Last Year: Never true    Ran Out of Food in the Last Year: Never true  Transportation Needs: No Transportation Needs (03/29/2022)   PRAPARE - Hydrologist (Medical): No    Lack of Transportation (Non-Medical): No  Physical Activity: Inactive (07/26/2017)   Exercise Vital Sign    Days of Exercise per Week: 0 days    Minutes of Exercise per Session: 0 min  Stress: Stress Concern Present (07/26/2017)   [place]    Feeling of Stress : Rather much  Social Connections:  Moderately Integrated (07/26/2017)   Social Connection and Isolation Panel [NHANES]    Frequency of Communication with Friends and Family: Once a week    Frequency of Social Gatherings with Friends and Family: Three times a week    Attends Religious Services: 1 to 4 times per year    Active Member of Clubs or Organizations: Yes    Attends Archivist Meetings: 1 to 4 times per year    Marital Status: Widowed  Intimate Partner Violence: Not At Risk (03/29/2022)   Humiliation, Afraid, Rape, and Kick questionnaire    Fear of Current or Ex-Partner: No    Emotionally Abused: No    Physically Abused: No    Sexually Abused: No                                                                                                 Objective:  Physical Exam: BP 132/78 (BP Location: Left Arm, Patient Position: Sitting, Cuff Size: Large)   Pulse 79   Wt 290 lb (131.5 kg)   SpO2 93%   BMI 45.42 kg/m    General: Alert and oriented x 3, during episode of dizziness she is remained conversant Eyes: Normal conjunctiva, anicteric. Round symmetric pupils.  ENT: Hearing grossly intact. No nasal discharge.  Neck: Neck is supple. No masses or thyromegaly.  Respiratory: Respirations are non-labored.  CTAB Skin: Warm. No rashes or ulcers.  Psych: Alert and oriented. Cooperative, Appropriate mood and affect, Normal judgment.  CV: No cyanosis or JVD, RRR, MRG MSK: Normal ambulation. No clubbing  Neuro: Sensation and CN II-XII grossly normal.  Alesia Banda, MD, MS

## 2022-04-06 ENCOUNTER — Ambulatory Visit: Payer: PPO | Admitting: Internal Medicine

## 2022-04-06 ENCOUNTER — Telehealth: Payer: Self-pay

## 2022-04-06 ENCOUNTER — Encounter: Payer: Self-pay | Admitting: Hematology

## 2022-04-06 ENCOUNTER — Encounter (HOSPITAL_COMMUNITY): Payer: Self-pay

## 2022-04-06 ENCOUNTER — Telehealth: Payer: Self-pay | Admitting: Family Medicine

## 2022-04-06 ENCOUNTER — Other Ambulatory Visit (HOSPITAL_COMMUNITY): Payer: Self-pay

## 2022-04-06 DIAGNOSIS — F0390 Unspecified dementia without behavioral disturbance: Secondary | ICD-10-CM | POA: Diagnosis not present

## 2022-04-06 DIAGNOSIS — E1140 Type 2 diabetes mellitus with diabetic neuropathy, unspecified: Secondary | ICD-10-CM | POA: Diagnosis not present

## 2022-04-06 DIAGNOSIS — G894 Chronic pain syndrome: Secondary | ICD-10-CM | POA: Diagnosis not present

## 2022-04-06 DIAGNOSIS — I130 Hypertensive heart and chronic kidney disease with heart failure and stage 1 through stage 4 chronic kidney disease, or unspecified chronic kidney disease: Secondary | ICD-10-CM | POA: Diagnosis not present

## 2022-04-06 DIAGNOSIS — J449 Chronic obstructive pulmonary disease, unspecified: Secondary | ICD-10-CM | POA: Diagnosis not present

## 2022-04-06 DIAGNOSIS — E1122 Type 2 diabetes mellitus with diabetic chronic kidney disease: Secondary | ICD-10-CM | POA: Diagnosis not present

## 2022-04-06 DIAGNOSIS — N1831 Chronic kidney disease, stage 3a: Secondary | ICD-10-CM | POA: Diagnosis not present

## 2022-04-06 DIAGNOSIS — E162 Hypoglycemia, unspecified: Secondary | ICD-10-CM

## 2022-04-06 DIAGNOSIS — I2510 Atherosclerotic heart disease of native coronary artery without angina pectoris: Secondary | ICD-10-CM | POA: Diagnosis not present

## 2022-04-06 DIAGNOSIS — Z794 Long term (current) use of insulin: Secondary | ICD-10-CM | POA: Diagnosis not present

## 2022-04-06 DIAGNOSIS — I5033 Acute on chronic diastolic (congestive) heart failure: Secondary | ICD-10-CM | POA: Diagnosis not present

## 2022-04-06 NOTE — Telephone Encounter (Signed)
Caller Name: [place] Call back phone #: [phone]  Reason for Call: 7/10 on pain scale for left shoulder and hand. She will not take meds because of constipation

## 2022-04-06 NOTE — Telephone Encounter (Signed)
Pharmacy Patient Advocate Encounter   Received notification from [facility] that prior authorization for Glucagen HypoKit '1mg'$  solution is required/requested.    PA submitted on 04/06/22 to [place] [employer] via CoverMyMeds  Key [id number] PA Case ID: [id number]  Status is pending

## 2022-04-07 ENCOUNTER — Other Ambulatory Visit (HOSPITAL_COMMUNITY): Payer: Self-pay

## 2022-04-07 DIAGNOSIS — I130 Hypertensive heart and chronic kidney disease with heart failure and stage 1 through stage 4 chronic kidney disease, or unspecified chronic kidney disease: Secondary | ICD-10-CM | POA: Diagnosis not present

## 2022-04-07 DIAGNOSIS — F0390 Unspecified dementia without behavioral disturbance: Secondary | ICD-10-CM | POA: Diagnosis not present

## 2022-04-07 DIAGNOSIS — Z794 Long term (current) use of insulin: Secondary | ICD-10-CM | POA: Diagnosis not present

## 2022-04-07 DIAGNOSIS — G894 Chronic pain syndrome: Secondary | ICD-10-CM | POA: Diagnosis not present

## 2022-04-07 DIAGNOSIS — J449 Chronic obstructive pulmonary disease, unspecified: Secondary | ICD-10-CM | POA: Diagnosis not present

## 2022-04-07 DIAGNOSIS — N1832 Chronic kidney disease, stage 3b: Secondary | ICD-10-CM | POA: Diagnosis not present

## 2022-04-07 DIAGNOSIS — I2510 Atherosclerotic heart disease of native coronary artery without angina pectoris: Secondary | ICD-10-CM | POA: Diagnosis not present

## 2022-04-07 DIAGNOSIS — E1140 Type 2 diabetes mellitus with diabetic neuropathy, unspecified: Secondary | ICD-10-CM | POA: Diagnosis not present

## 2022-04-07 DIAGNOSIS — I5032 Chronic diastolic (congestive) heart failure: Secondary | ICD-10-CM | POA: Diagnosis not present

## 2022-04-07 DIAGNOSIS — E1122 Type 2 diabetes mellitus with diabetic chronic kidney disease: Secondary | ICD-10-CM | POA: Diagnosis not present

## 2022-04-07 NOTE — Telephone Encounter (Unsigned)
Pharmacy Patient Advocate Encounter  Received notification from [facility] that the request for prior authorization for Glucagen Hypotkit '1mg'$  Solution has been denied due to .

## 2022-04-07 NOTE — Telephone Encounter (Signed)
Please advise in Dr. Biagio Borg absence. Patient was seen in office on 04/05/2022.

## 2022-04-07 NOTE — Telephone Encounter (Signed)
Spoke with patient's daughter, [name], she states that patient is no longer constipated and she stated she is using Voltaren gel for pain and if it doesn't provide relief she will reach out to pain management.

## 2022-04-09 DIAGNOSIS — E162 Hypoglycemia, unspecified: Secondary | ICD-10-CM | POA: Insufficient documentation

## 2022-04-09 NOTE — Assessment & Plan Note (Signed)
No improvement with trazodone or melatonin Recommend trial of low-dose Belsomra as needed Patient follow-up

## 2022-04-09 NOTE — Assessment & Plan Note (Signed)
Recommend against gabapentin joints due to respiratory depressant effects, recommend against tricyclic's given history of significant cardiac disease Trial Cymbalta and monitor blood pressure closely

## 2022-04-09 NOTE — Assessment & Plan Note (Addendum)
Secondary to insulin dosing without p.o. Did have improvement status post p.o. glucose tablets and crackers plus juice Patient prescribed glucagon and glucose tablets Counseled on not getting insulin dosing without eating Recommend patient follow-up with endocrinology for further management

## 2022-04-10 ENCOUNTER — Other Ambulatory Visit: Payer: Self-pay | Admitting: Family Medicine

## 2022-04-10 ENCOUNTER — Other Ambulatory Visit: Payer: Self-pay | Admitting: Nurse Practitioner

## 2022-04-12 NOTE — Telephone Encounter (Signed)
Pt has not been seen at [facility].

## 2022-04-13 MED ORDER — GLUCAGON (RDNA) 1 MG IJ KIT: 1.0000 mg | PACK | Freq: Once | INTRAMUSCULAR | 12 refills | Status: AC | PRN

## 2022-04-14 ENCOUNTER — Telehealth: Payer: Self-pay | Admitting: Family Medicine

## 2022-04-14 NOTE — Telephone Encounter (Signed)
Caller Name: [place] Call back phone #: [phone]  Reason for Call: Swelling in right leg/arm. No red and is not tender. Wanted to bring to provider attention because of recent history with blood clots.

## 2022-04-14 NOTE — Telephone Encounter (Signed)
Left patient a detailed voice message regarding annotation below and to return call to office for further assistance

## 2022-04-17 NOTE — Telephone Encounter (Signed)
Offered patient appointment with PCP on 04/18/22 at 4pm. Patient declined and asked for appointment next week. Patient scheduled on 04/24/22 with PCP.

## 2022-04-20 ENCOUNTER — Other Ambulatory Visit: Payer: Self-pay | Admitting: Family Medicine

## 2022-04-20 ENCOUNTER — Ambulatory Visit (INDEPENDENT_AMBULATORY_CARE_PROVIDER_SITE_OTHER): Payer: PPO | Admitting: *Deleted

## 2022-04-20 ENCOUNTER — Other Ambulatory Visit: Payer: Self-pay | Admitting: Hematology

## 2022-04-20 DIAGNOSIS — Z794 Long term (current) use of insulin: Secondary | ICD-10-CM

## 2022-04-20 DIAGNOSIS — I10 Essential (primary) hypertension: Secondary | ICD-10-CM

## 2022-04-20 NOTE — Chronic Care Management (AMB) (Signed)
Chronic Care Management   CCM RN Visit Note  04/20/2022 Name: ARICELA BERTAGNOLLI MRN: [id number] DOB: 02/03/1935  Subjective: ALEIGHYA MCANELLY is a 87 y.o. year old female who is a primary care patient of Bonnita Hollow, MD. The patient was referred to the Chronic Care Management team for assistance with care management needs subsequent to provider initiation of CCM services and plan of care.    Today's Visit:  Engaged with patient by telephone for initial visit.     SDOH Interventions Today    Flowsheet Row Most Recent Value  SDOH Interventions   Food Insecurity Interventions Intervention Not Indicated  Housing Interventions Intervention Not Indicated  Transportation Interventions Intervention Not Indicated  Utilities Interventions Intervention Not Indicated  Financial Strain Interventions Intervention Not Indicated  Physical Activity Interventions Intervention Not Indicated  [already working with PT 2 x per week]  Stress Interventions Intervention Not Indicated  Social Connections Interventions Patient Refused  [difficult for pt to leave home]         Goals Addressed             This Visit's Progress    CCM (DIABETES) EXPECTED OUTCOME:  MONITOR, SELF-MANAGE AND REDUCE SYMPTOMS OF DIABETES       Current Barriers:  Knowledge Deficits related to Diabetes management Chronic Disease Management support and education needs related to Diabetes and diet Cognitive Deficits Per patient's daughter Liz Malady, CBG is monitored with [employer], states fasting ranges are usually 121-134 but over the past week has hypoglycemic episodes around 530 am in the morning (several) with readings 45-51 and states pt is symptomatic with not feeling well. [name] reports pt does not eat a bedtime snack but she plans to start to see if this will help, she has glucose tablets on hand.  Planned Interventions: Provided education to patient about basic DM disease process; Reviewed medications with  patient and discussed importance of medication adherence;        Reviewed prescribed diet with patient carbohydrate modified; Counseled on importance of regular laboratory monitoring as prescribed;        Discussed plans with patient for ongoing care management follow up and provided patient with direct contact information for care management team;      Provided patient with written educational materials related to hypo and hyperglycemia and importance of correct treatment;       call provider for findings outside established parameters;       Review of patient status, including review of consultants reports, relevant laboratory and other test results, and medications completed;       Advised patient to discuss hypoglycemia with provider;      Screening for signs and symptoms of depression related to chronic disease state;        Assessed social determinant of health barriers;        Message sent to Dr. Kelton Pillar (endocrinology), reporting pt has had hypoglycemic episodes over the past week with readings 45-51 (occurring around 530 am) and pt is symptomatic  Symptom Management: Take medications as prescribed   Attend all scheduled provider appointments Call pharmacy for medication refills 3-7 days in advance of running out of medications Attend church or other social activities Perform all self care activities independently  Perform IADL's (shopping, preparing meals, housekeeping, managing finances) independently Call provider office for new concerns or questions  check blood sugar at prescribed times: [employer]  check feet daily for cuts, sores or redness enter blood sugar readings and medication or  insulin into daily log take the blood sugar log to all doctor visits take the blood sugar meter to all doctor visits trim toenails straight across fill half of plate with vegetables limit fast food meals to no more than 1 per week manage portion size read food labels for fat, fiber,  carbohydrates and portion size keep feet up while sitting wash and dry feet carefully every day Try eating a snack at hs with protein and carbohydrate Please stay in contact with your doctor and report hypoglycemia Follow RULE OF 15 for low blood sugar management:  How to treat low blood sugars (Blood sugar less than 70 mg/dl  Please follow the RULE OF 15 for the treatment of hypoglycemia treatment (When your blood sugars are less than 70 mg/ dl) STEP  1:  Take 15 grams of carbohydrates when your blood sugar is low, which includes:   3-4 glucose tabs or  3-4 oz of juice or regular soda or  One tube of glucose gel STEP 2:  Recheck blood sugar in 15 minutes STEP 3:  If your blood sugar is still low at the 15 minute recheck ---then, go back to STEP 1 and treat again with another 15 grams of carbohydrates  Follow Up Plan: Telephone follow up appointment with care management team member scheduled for:  05/18/22 at 1045 am       CCM (HYPERTENSION) EXPECTED OUTCOME: MONITOR, SELF-MANAGE AND REDUCE SYMPTOMS OF HYPERTENSION       Current Barriers:  Knowledge Deficits related to Hypertension management Chronic Disease Management support and education needs related to Hypertension Cognitive Deficits-dementia Spoke with patient's daughter Liz Malady who lives with patient, reports patient has "mild dementia", states pt needs assistance with all ADL's, IADL's, pt has WC, walker, shower seat and stays in bed a lot, has home health PT seeing pt for strengthening, states therapist checks blood pressure during visit and daughter does not check at other times.  [name] is primary caregiver,  pt has another daughter that provides meals, pt refuses PACE program.  [name] denies any needs for social work.  Planned Interventions: Evaluation of current treatment plan related to hypertension self management and patient's adherence to plan as established by provider;   Reviewed prescribed diet low sodium Reviewed  medications with patient and discussed importance of compliance;  Counseled on the importance of exercise goals with target of 150 minutes per week Discussed plans with patient for ongoing care management follow up and provided patient with direct contact information for care management team; Advised patient, providing education and rationale, to monitor blood pressure daily and record, calling PCP for findings outside established parameters;  Provided education on prescribed diet low sodium;  Discussed complications of poorly controlled blood pressure such as heart disease, stroke, circulatory complications, vision complications, kidney impairment, sexual dysfunction;  Screening for signs and symptoms of depression related to chronic disease state;  Assessed social determinant of health barriers;   Symptom Management: Take medications as prescribed   Attend all scheduled provider appointments Call pharmacy for medication refills 3-7 days in advance of running out of medications Attend church or other social activities Perform all self care activities independently  Perform IADL's (shopping, preparing meals, housekeeping, managing finances) independently Call provider office for new concerns or questions  check blood pressure weekly choose a place to take my blood pressure (home, clinic or office, retail store) write blood pressure results in a log or diary keep a blood pressure log take blood pressure log to all  doctor appointments keep all doctor appointments take medications for blood pressure exactly as prescribed report new symptoms to your doctor eat more whole grains, fruits and vegetables, lean meats and healthy fats Look over education sent via my chart- low sodium diet Continue working with home health physical therapist, complete exercises they prescribe  Follow Up Plan: Telephone follow up appointment with care management team member scheduled for:  05/19/23 at 1045 am           Plan:Telephone follow up appointment with care management team member scheduled for:  05/18/22 at 1045 am  Jacqlyn Larsen [facility], BSN RN Case Manager [employer] [phone]

## 2022-04-20 NOTE — Patient Instructions (Signed)
Please call the care guide team at [phone] if you need to cancel or reschedule your appointment.   If you are experiencing a Mental Health or [place] or need someone to talk to, please call the Suicide and Crisis Lifeline: 988 call the [place] National Suicide Prevention Lifeline: 720-101-2336 or TTY: 272 788 0280 TTY [phone]) to talk to a trained counselor call 1-800-273-TALK (toll free, 24 hour hotline) go to [facility] [place] [phone]) call the [place]: [phone] call 911   Following is a copy of your full provider care plan:   Goals Addressed             This Visit's Progress    CCM (DIABETES) EXPECTED OUTCOME:  MONITOR, SELF-MANAGE AND REDUCE SYMPTOMS OF DIABETES       Current Barriers:  Knowledge Deficits related to Diabetes management Chronic Disease Management support and education needs related to Diabetes and diet Cognitive Deficits Per patient's daughter Liz Malady, CBG is monitored with [facility], states fasting ranges are usually 121-134 but over the past week has hypoglycemic episodes around 530 am in the morning (several) with readings 45-51 and states pt is symptomatic with not feeling well. [name] reports pt does not eat a bedtime snack but she plans to start to see if this will help, she has glucose tablets on hand.  Planned Interventions: Provided education to patient about basic DM disease process; Reviewed medications with patient and discussed importance of medication adherence;        Reviewed prescribed diet with patient carbohydrate modified; Counseled on importance of regular laboratory monitoring as prescribed;        Discussed plans with patient for ongoing care management follow up and provided patient with direct contact information for care management team;      Provided patient with written educational materials related to hypo and  hyperglycemia and importance of correct treatment;       call provider for findings outside established parameters;       Review of patient status, including review of consultants reports, relevant laboratory and other test results, and medications completed;       Advised patient to discuss hypoglycemia with provider;      Screening for signs and symptoms of depression related to chronic disease state;        Assessed social determinant of health barriers;        Message sent to Dr. Kelton Pillar (endocrinology), reporting pt has had hypoglycemic episodes over the past week with readings 45-51 (occurring around 530 am) and pt is symptomatic  Symptom Management: Take medications as prescribed   Attend all scheduled provider appointments Call pharmacy for medication refills 3-7 days in advance of running out of medications Attend church or other social activities Perform all self care activities independently  Perform IADL's (shopping, preparing meals, housekeeping, managing finances) independently Call provider office for new concerns or questions  check blood sugar at prescribed times: Freestyle LIbre  check feet daily for cuts, sores or redness enter blood sugar readings and medication or insulin into daily log take the blood sugar log to all doctor visits take the blood sugar meter to all doctor visits trim toenails straight across fill half of plate with vegetables limit fast food meals to no more than 1 per week manage portion size read food labels for fat, fiber, carbohydrates and portion size keep feet up while sitting wash and dry feet carefully every day Try eating a  snack at hs with protein and carbohydrate Please stay in contact with your doctor and report hypoglycemia Follow RULE OF 15 for low blood sugar management:  How to treat low blood sugars (Blood sugar less than 70 mg/dl  Please follow the RULE OF 15 for the treatment of hypoglycemia treatment (When your blood sugars  are less than 70 mg/ dl) STEP  1:  Take 15 grams of carbohydrates when your blood sugar is low, which includes:   3-4 glucose tabs or  3-4 oz of juice or regular soda or  One tube of glucose gel STEP 2:  Recheck blood sugar in 15 minutes STEP 3:  If your blood sugar is still low at the 15 minute recheck ---then, go back to STEP 1 and treat again with another 15 grams of carbohydrates  Follow Up Plan: Telephone follow up appointment with care management team member scheduled for:  05/18/22 at 1045 am       CCM (HYPERTENSION) EXPECTED OUTCOME: MONITOR, SELF-MANAGE AND REDUCE SYMPTOMS OF HYPERTENSION       Current Barriers:  Knowledge Deficits related to Hypertension management Chronic Disease Management support and education needs related to Hypertension Cognitive Deficits-dementia Spoke with patient's daughter Liz Malady who lives with patient, reports patient has "mild dementia", states pt needs assistance with all ADL's, IADL's, pt has WC, walker, shower seat and stays in bed a lot, has home health PT seeing pt for strengthening, states therapist checks blood pressure during visit and daughter does not check at other times.  [name] is primary caregiver,  pt has another daughter that provides meals, pt refuses PACE program.  [name] denies any needs for social work.  Planned Interventions: Evaluation of current treatment plan related to hypertension self management and patient's adherence to plan as established by provider;   Reviewed prescribed diet low sodium Reviewed medications with patient and discussed importance of compliance;  Counseled on the importance of exercise goals with target of 150 minutes per week Discussed plans with patient for ongoing care management follow up and provided patient with direct contact information for care management team; Advised patient, providing education and rationale, to monitor blood pressure daily and record, calling PCP for findings outside established  parameters;  Provided education on prescribed diet low sodium;  Discussed complications of poorly controlled blood pressure such as heart disease, stroke, circulatory complications, vision complications, kidney impairment, sexual dysfunction;  Screening for signs and symptoms of depression related to chronic disease state;  Assessed social determinant of health barriers;   Symptom Management: Take medications as prescribed   Attend all scheduled provider appointments Call pharmacy for medication refills 3-7 days in advance of running out of medications Attend church or other social activities Perform all self care activities independently  Perform IADL's (shopping, preparing meals, housekeeping, managing finances) independently Call provider office for new concerns or questions  check blood pressure weekly choose a place to take my blood pressure (home, clinic or office, retail store) write blood pressure results in a log or diary keep a blood pressure log take blood pressure log to all doctor appointments keep all doctor appointments take medications for blood pressure exactly as prescribed report new symptoms to your doctor eat more whole grains, fruits and vegetables, lean meats and healthy fats Look over education sent via my chart- low sodium diet Continue working with home health physical therapist, complete exercises they prescribe  Follow Up Plan: Telephone follow up appointment with care management team member scheduled for:  05/19/23 at  1045 am          Patient verbalizes understanding of instructions and care plan provided today and agrees to view in [place]. Active MyChart status and patient understanding of how to access instructions and care plan via MyChart confirmed with patient.     Telephone follow up appointment with care management team member scheduled for:  05/18/22 at 1045 am  Low-Sodium Eating Plan Sodium, which is an element that makes up salt, helps you  maintain a healthy balance of fluids in your body. Too much sodium can increase your blood pressure and cause fluid and waste to be held in your body. Your health care provider or dietitian may recommend following this plan if you have high blood pressure (hypertension), kidney disease, liver disease, or heart failure. Eating less sodium can help lower your blood pressure, reduce swelling, and protect your heart, liver, and kidneys. What are tips for following this plan? Reading food labels The Nutrition Facts label lists the amount of sodium in one serving of the food. If you eat more than one serving, you must multiply the listed amount of sodium by the number of servings. Choose foods with less than 140 mg of sodium per serving. Avoid foods with 300 mg of sodium or more per serving. Shopping  Look for lower-sodium products, often labeled as "low-sodium" or "no salt added." Always check the sodium content, even if foods are labeled as "unsalted" or "no salt added." Buy fresh foods. Avoid canned foods and pre-made or frozen meals. Avoid canned, cured, or processed meats. Buy breads that have less than 80 mg of sodium per slice. Cooking  Eat more home-cooked food and less restaurant, buffet, and fast food. Avoid adding salt when cooking. Use salt-free seasonings or herbs instead of table salt or sea salt. Check with your health care provider or pharmacist before using salt substitutes. Cook with plant-based oils, such as canola, sunflower, or olive oil. Meal planning When eating at a restaurant, ask that your food be prepared with less salt or no salt, if possible. Avoid dishes labeled as brined, pickled, cured, smoked, or made with soy sauce, miso, or teriyaki sauce. Avoid foods that contain MSG (monosodium glutamate). MSG is sometimes added to [place] food, bouillon, and some canned foods. Make meals that can be grilled, baked, poached, roasted, or steamed. These are generally made with less  sodium. General information Most people on this plan should limit their sodium intake to 1,500-2,000 mg (milligrams) of sodium each day. What foods should I eat? Fruits Fresh, frozen, or canned fruit. Fruit juice. Vegetables Fresh or frozen vegetables. "No salt added" canned vegetables. "No salt added" tomato sauce and paste. Low-sodium or reduced-sodium tomato and vegetable juice. Grains Low-sodium cereals, including oats, puffed wheat and rice, and shredded wheat. Low-sodium crackers. Unsalted rice. Unsalted pasta. Low-sodium bread. Whole-grain breads and whole-grain pasta. Meats and other proteins Fresh or frozen (no salt added) meat, poultry, seafood, and fish. Low-sodium canned tuna and salmon. Unsalted nuts. Dried peas, beans, and lentils without added salt. Unsalted canned beans. Eggs. Unsalted nut butters. Dairy Milk. Soy milk. Cheese that is naturally low in sodium, such as ricotta cheese, fresh mozzarella, or Swiss cheese. Low-sodium or reduced-sodium cheese. Cream cheese. Yogurt. Seasonings and condiments Fresh and dried herbs and spices. Salt-free seasonings. Low-sodium mustard and ketchup. Sodium-free salad dressing. Sodium-free light mayonnaise. Fresh or refrigerated horseradish. Lemon juice. Vinegar. Other foods Homemade, reduced-sodium, or low-sodium soups. Unsalted popcorn and pretzels. Low-salt or salt-free chips. The items listed  above may not be a complete list of foods and beverages you can eat. Contact a dietitian for more information. What foods should I avoid? Vegetables Sauerkraut, pickled vegetables, and relishes. Olives. [place] fries. Onion rings. Regular canned vegetables (not low-sodium or reduced-sodium). Regular canned tomato sauce and paste (not low-sodium or reduced-sodium). Regular tomato and vegetable juice (not low-sodium or reduced-sodium). Frozen vegetables in sauces. Grains Instant hot cereals. Bread stuffing, pancake, and biscuit mixes. Croutons.  Seasoned rice or pasta mixes. Noodle soup cups. Boxed or frozen macaroni and cheese. Regular salted crackers. Self-rising flour. Meats and other proteins Meat or fish that is salted, canned, smoked, spiced, or pickled. Precooked or cured meat, such as sausages or meat loaves. Berniece Salines. Ham. Pepperoni. Hot dogs. Corned beef. Chipped beef. Salt pork. Jerky. Pickled herring. Anchovies and sardines. Regular canned tuna. Salted nuts. Dairy Processed cheese and cheese spreads. Hard cheeses. Cheese curds. Blue cheese. Feta cheese. String cheese. Regular cottage cheese. Buttermilk. Canned milk. Fats and oils Salted butter. Regular margarine. Ghee. Bacon fat. Seasonings and condiments Onion salt, garlic salt, seasoned salt, table salt, and sea salt. Canned and packaged gravies. Worcestershire sauce. Tartar sauce. Barbecue sauce. Teriyaki sauce. Soy sauce, including reduced-sodium. Steak sauce. Fish sauce. Oyster sauce. Cocktail sauce. Horseradish that you find on the shelf. Regular ketchup and mustard. Meat flavorings and tenderizers. Bouillon cubes. Hot sauce. Pre-made or packaged marinades. Pre-made or packaged taco seasonings. Relishes. Regular salad dressings. Salsa. Other foods Salted popcorn and pretzels. Corn chips and puffs. Potato and tortilla chips. Canned or dried soups. Pizza. Frozen entrees and pot pies. The items listed above may not be a complete list of foods and beverages you should avoid. Contact a dietitian for more information. Summary Eating less sodium can help lower your blood pressure, reduce swelling, and protect your heart, liver, and kidneys. Most people on this plan should limit their sodium intake to 1,500-2,000 mg (milligrams) of sodium each day. Canned, boxed, and frozen foods are high in sodium. Restaurant foods, fast foods, and pizza are also very high in sodium. You also get sodium by adding salt to food. Try to cook at home, eat more fresh fruits and vegetables, and eat less  fast food and canned, processed, or prepared foods. This information is not intended to replace advice given to you by your health care provider. Make sure you discuss any questions you have with your health care provider. Document Revised: 05/30/2019 Document Reviewed: 03/26/2019 Elsevier Patient Education  [place]. Hypoglycemia Hypoglycemia is when the sugar (glucose) level in your blood is too low. Low blood sugar can happen to people who have diabetes and people who do not have diabetes. Low blood sugar can happen quickly, and it can be an emergency. What are the causes? This condition happens most often in people who have diabetes. It may be caused by: Diabetes medicine. Not eating enough, or not eating often enough. Doing more physical activity. Drinking alcohol on an empty stomach. If you do not have diabetes, this condition may be caused by: A tumor in the pancreas. Not eating enough, or not eating for long periods at a time (fasting). A very bad infection or illness. Problems after having weight loss (bariatric) surgery. Kidney failure or liver failure. Certain medicines. What increases the risk? This condition is more likely to develop in people who: Have diabetes and take medicines to lower their blood sugar. Abuse alcohol. Have a very bad illness. What are the signs or symptoms? Mild Hunger. Sweating and feeling  clammy. Feeling dizzy or light-headed. Being sleepy or having trouble sleeping. Feeling like you may vomit (nauseous). A fast heartbeat. A headache. Blurry vision. Mood changes, such as: Being grouchy. Feeling worried or nervous (anxious). Tingling or loss of feeling (numbness) around your mouth, lips, or tongue. Moderate Confusion and poor judgment. Behavior changes. Weakness. Uneven heartbeat. Trouble with moving (coordination). Very low Very low blood sugar (severe hypoglycemia) is a medical emergency. It can  cause: Fainting. Seizures. Loss of consciousness (coma). Death. How is this treated? Treating low blood sugar Low blood sugar is often treated by eating or drinking something that has sugar in it right away. The food or drink should contain 15 grams of a fast-acting carb (carbohydrate). Options include: 4 oz (120 mL) of fruit juice. 4 oz (120 mL) of regular soda (not diet soda). A few pieces of hard candy. Check food labels to see how many pieces to eat for 15 grams. 1 Tbsp (15 mL) of sugar or honey. 4 glucose tablets. 1 tube of glucose gel. Treating low blood sugar if you have diabetes If you can think clearly and swallow safely, follow the 15:15 rule: Take 15 grams of a fast-acting carb. Talk with your doctor about how much you should take. Always keep a source of fast-acting carb with you, such as: Glucose tablets (take 4 tablets). A few pieces of hard candy. Check food labels to see how many pieces to eat for 15 grams. 4 oz (120 mL) of fruit juice. 4 oz (120 mL) of regular soda (not diet soda). 1 Tbsp (15 mL) of honey or sugar. 1 tube of glucose gel. Check your blood sugar 15 minutes after you take the carb. If your blood sugar is still at or below 70 mg/dL (3.9 mmol/L), take 15 grams of a carb again. If your blood sugar does not go above 70 mg/dL (3.9 mmol/L) after 3 tries, get help right away. After your blood sugar goes back to normal, eat a meal or a snack within 1 hour.  Treating very low blood sugar If your blood sugar is below 54 mg/dL (3 mmol/L), you have very low blood sugar, or severe hypoglycemia. This is an emergency. Get medical help right away. If you have very low blood sugar and you cannot eat or drink, you will need to be given a hormone called glucagon. A family member or friend should learn how to check your blood sugar and how to give you glucagon. Ask your doctor if you need to have an emergency glucagon kit at home. Very low blood sugar may also need to be  treated in a hospital. Follow these instructions at home: General instructions Take over-the-counter and prescription medicines only as told by your doctor. Stay aware of your blood sugar as told by your doctor. If you drink alcohol: Limit how much you have to: 0-1 drink a day for women who are not pregnant. 0-2 drinks a day for men. Know how much alcohol is in your drink. In the [place] one drink equals one 12 oz bottle of beer (355 mL), one 5 oz glass of wine (148 mL), or one 1 oz glass of hard liquor (44 mL). Be sure to eat food when you drink alcohol. Know that your body absorbs alcohol quickly. This may lead to low blood sugar later. Be sure to keep checking your blood sugar. Keep all follow-up visits. If you have diabetes:  Always have a fast-acting carb (15 grams) with you to treat low blood sugar.  Follow your diabetes care plan as told by your doctor. Make sure you: Know the symptoms of low blood sugar. Check your blood sugar as often as told. Always check it before and after exercise. Always check your blood sugar before you drive. Take your medicines as told. Follow your meal plan. Eat on time. Do not skip meals. Share your diabetes care plan with: Your work or school. People you live with. Carry a card or wear jewelry that says you have diabetes. Where to find more information American Diabetes Association: www.diabetes.org Contact a doctor if: You have trouble keeping your blood sugar in your target range. You have low blood sugar often. Get help right away if: You still have symptoms after you eat or drink something that contains 15 grams of fast-acting carb, and you cannot get your blood sugar above 70 mg/dL by following the 15:15 rule. Your blood sugar is below 54 mg/dL (3 mmol/L). You have a seizure. You faint. These symptoms may be an emergency. Get help right away. Call your local emergency services (911 in the [place] Do not wait to see if the symptoms will go  away. Do not drive yourself to the hospital. Summary Hypoglycemia happens when the level of sugar (glucose) in your blood is too low. Low blood sugar can happen to people who have diabetes and people who do not have diabetes. Low blood sugar can happen quickly, and it can be an emergency. Make sure you know the symptoms of low blood sugar and know how to treat it. Always keep a source of sugar (fast-acting carb) with you to treat low blood sugar. This information is not intended to replace advice given to you by your health care provider. Make sure you discuss any questions you have with your health care provider. Document Revised: 03/25/2020 Document Reviewed: 03/25/2020 Elsevier Patient Education  [place].

## 2022-04-20 NOTE — Plan of Care (Signed)
Chronic Care Management Provider Comprehensive Care Plan    04/20/2022 Name: [name] MRN: [id number] DOB: Jul 13, 1934  Referral to Chronic Care Management (CCM) services was placed by Provider:  Josephine Igo MD on Date: 03/17/22.  Chronic Condition 1: HYPERTENSION Provider Assessment and Plan #HTN: Fairly well controlled, mainly 120-130s at home. -Continue coreg 12.80m BID   Expected Outcome/Goals Addressed This Visit (Provider CCM goals/Provider Assessment and plan  CCM (HYPERTENSION) EXPECTED OUTCOME: MONITOR, SELF-MANAGE AND REDUCE SYMPTOMS OF HYPERTENSION  Symptom Management Condition 1: Take medications as prescribed   Attend all scheduled provider appointments Call pharmacy for medication refills 3-7 days in advance of running out of medications Attend church or other social activities Perform all self care activities independently  Perform IADL's (shopping, preparing meals, housekeeping, managing finances) independently Call provider office for new concerns or questions  check blood pressure weekly choose a place to take my blood pressure (home, clinic or office, retail store) write blood pressure results in a log or diary keep a blood pressure log take blood pressure log to all doctor appointments keep all doctor appointments take medications for blood pressure exactly as prescribed report new symptoms to your doctor eat more whole grains, fruits and vegetables, lean meats and healthy fats Look over education sent via my chart- low sodium diet Continue working with home health physical therapist, complete exercises they prescribe  Chronic Condition 2: DIABETES Provider Assessment and Plan  Diabetic peripheral neuropathy associated with type 2 diabetes mellitus ([place]       Recommend against gabapentin joints due to respiratory depressant effects, recommend against tricyclic's given history of significant cardiac disease Trial Cymbalta and monitor  blood pressure closely        Relevant Medications    DULoxetine (CYMBALTA) 30 MG capsule    Suvorexant (BELSOMRA) 5 MG TABS    glucose 4 GM chewable tablet    Glucagon, rDNA, (GLUCAGON EMERGENCY) 1 MG KIT     Expected Outcome/Goals Addressed This Visit (Provider CCM goals/Provider Assessment and plan  CCM (DIABETES) EXPECTED OUTCOME:  MONITOR, SELF-MANAGE AND REDUCE SYMPTOMS OF DIABETES  Symptom Management Condition 2: Take medications as prescribed   Attend all scheduled provider appointments Call pharmacy for medication refills 3-7 days in advance of running out of medications Attend church or other social activities Perform all self care activities independently  Perform IADL's (shopping, preparing meals, housekeeping, managing finances) independently Call provider office for new concerns or questions  check blood sugar at prescribed times: Freestyle LIbre  check feet daily for cuts, sores or redness enter blood sugar readings and medication or insulin into daily log take the blood sugar log to all doctor visits take the blood sugar meter to all doctor visits trim toenails straight across fill half of plate with vegetables limit fast food meals to no more than 1 per week manage portion size read food labels for fat, fiber, carbohydrates and portion size keep feet up while sitting wash and dry feet carefully every day Try eating a snack at hs with protein and carbohydrate Please stay in contact with your doctor and report hypoglycemia Follow RULE OF 15 for low blood sugar management:  How to treat low blood sugars (Blood sugar less than 70 mg/dl  Please follow the RULE OF 15 for the treatment of hypoglycemia treatment (When your blood sugars are less than 70 mg/ dl) STEP  1:  Take 15 grams of carbohydrates when your blood sugar is low, which includes:   3-4  glucose tabs or  3-4 oz of juice or regular soda or  One tube of glucose gel STEP 2:  Recheck blood sugar in 15  minutes STEP 3:  If your blood sugar is still low at the 15 minute recheck ---then, go back to STEP 1 and treat again with another 15 grams of carbohydrates  Problem List Patient Active Problem List   Diagnosis Date Noted   Hypoglycemia 04/09/2022   Diabetic peripheral neuropathy associated with type 2 diabetes mellitus ([place]) 04/05/2022   GI bleed 03/27/2022   Acute upper GI bleed 03/26/2022   Melena 03/26/2022   Acute deep vein thrombosis (DVT) of femoral vein of right lower extremity ([place]) 03/26/2022   Stage 3b chronic kidney disease (CKD) ([place]) 03/26/2022   COPD (chronic obstructive pulmonary disease) ([place]) 03/26/2022   History of anemia due to chronic kidney disease 03/26/2022   GAD (generalized anxiety disorder) 03/26/2022   Allergic rhinitis 03/26/2022   Type 2 diabetes mellitus with stage 4 chronic kidney disease, with long-term current use of insulin ([place]) 02/16/2022   Type 2 diabetes mellitus with diabetic neuropathy, with long-term current use of insulin ([place]) 02/16/2022   DM2 (diabetes mellitus, type 2) ([place]) 02/16/2022   Dermatosis papulosa nigra 02/09/2022   Positive D dimer 02/09/2022   CKD (chronic kidney disease), stage IV ([place]) 02/09/2022   Pneumobilia 02/08/2022   Severe episode of recurrent major depressive disorder, without psychotic features ([place]) 12/05/2021   AKI (acute kidney injury) ([place]) 11/22/2021   Acute on chronic diastolic (congestive) heart failure ([place]) 11/19/2021   Anxiety 11/10/2021   Breast cancer ([place]) 10/22/2021   Right elbow pain 04/27/2021   Chronic pain of right hand 04/27/2021   Iron deficiency anemia 09/13/2020   Bilateral primary osteoarthritis of knee 04/12/2020   Chronic radicular lumbar pain 03/18/2020   Lumbar spondylosis 03/18/2020   Spinal stenosis, lumbar region, with neurogenic claudication 03/18/2020   Moderate nonproliferative diabetic retinopathy of both eyes without macular edema associated with type 2 diabetes mellitus ([place])  08/27/2019   Type 2 diabetes mellitus with hyperlipidemia ([place]) 12/13/2017   GERD with esophagitis 11/15/2016   Dementia ([place]) 11/15/2016   Endometrial polyp 06/23/2016   Vitamin D deficiency 08/02/2015   Acute kidney injury superimposed on chronic kidney disease ([place]) 12/18/2014   Gout 10/27/2014   CN (constipation) 05/10/2014   COPD with chronic bronchitis 04/21/2014   Estrogen deficiency 02/19/2014   Atypical chest pain 12/31/2013   Insomnia 09/17/2012   Long term current use of anticoagulant therapy 02/02/2012   Chronic diastolic CHF (congestive heart failure) ([place]) 01/29/2012   Class 3 obesity ([place]) 02/15/2009   Microcytic anemia 02/15/2009   TENSION HEADACHE 01/07/2008   Hyperlipidemia 12/03/2006   Essential hypertension 12/03/2006    Medication Management  Current Outpatient Medications:    albuterol (PROAIR HFA) 108 (90 Base) MCG/ACT inhaler, Inhale 1-2 puffs into the lungs every 6 (six) hours as needed for wheezing or shortness of breath., Disp: 6.7 g, Rfl: 1   anastrozole (ARIMIDEX) 1 MG tablet, TAKE 1 TABLET BY MOUTH EVERY DAY, Disp: 28 tablet, Rfl: 1   apixaban (ELIQUIS) 5 MG TABS tablet, Take 1 tablet (5 mg total) by mouth 2 (two) times daily., Disp: 180 tablet, Rfl: 3   atorvastatin (LIPITOR) 80 MG tablet, TAKE 1 TABLET BY MOUTH EVERY DAY (Patient taking differently: Take 80 mg by mouth daily.), Disp: 90 tablet, Rfl: 3   budesonide-formoterol (SYMBICORT) 160-4.5 MCG/ACT inhaler, Inhale 2 puffs into the lungs 2 (two) times daily.,  Disp: 1 each, Rfl: 3   carvedilol (COREG) 12.5 MG tablet, TAKE 1 TABLET BY MOUTH 2 TIMES DAILY (Patient taking differently: Take 12.5 mg by mouth in the morning and at bedtime.), Disp: 180 tablet, Rfl: 1   cetirizine (ZYRTEC) 10 MG tablet, Take 10 mg by mouth daily as needed for allergies or rhinitis., Disp: , Rfl:    Continuous Blood Gluc Sensor (FREESTYLE LIBRE 14 DAY SENSOR) MISC, PLACE 1 DEVICE ON THE SKIN AS DIRECTED EVERY 14 DAYS, Disp: 6  each, Rfl: 2   diclofenac Sodium (VOLTAREN) 1 % GEL, Apply 2 g topically 4 (four) times daily. (Patient taking differently: Apply 2 g topically 4 (four) times daily as needed (pain).), Disp: 150 g, Rfl: 2   DULoxetine (CYMBALTA) 30 MG capsule, Take 1 capsule (30 mg total) by mouth daily., Disp: 90 capsule, Rfl: 0   febuxostat (ULORIC) 40 MG tablet, Take 1 tablet (40 mg total) by mouth daily., Disp: 90 tablet, Rfl: 3   fluticasone (FLONASE) 50 MCG/ACT nasal spray, Place 1 spray into both nostrils in the morning and at bedtime. (Patient taking differently: Place 1 spray into both nostrils 2 (two) times daily as needed for rhinitis or allergies.), Disp: 61.4 mL, Rfl: 3   folic acid (FOLVITE) 1 MG tablet, TAKE 1 TABLET BY MOUTH EVERY DAY (Patient taking differently: Take 1 mg by mouth daily.), Disp: 90 tablet, Rfl: 1   glucagon 1 MG injection, Inject 1 mg into the vein once as needed for up to 1 dose., Disp: 1 each, Rfl: 12   glucose 4 GM chewable tablet, Chew 1 tablet (4 g total) by mouth as needed for low blood sugar., Disp: 50 tablet, Rfl: 12   insulin glargine, 2 Unit Dial, (TOUJEO MAX SOLOSTAR) 300 UNIT/ML Solostar Pen, Inject 42 Units into the skin daily. Patient assistance program provides, Disp: 15 mL, Rfl: 6   linaclotide (LINZESS) 145 MCG CAPS capsule, Take 1 capsule (145 mcg total) by mouth daily before breakfast., Disp: 30 capsule, Rfl: 2   meclizine (ANTIVERT) 25 MG tablet, TAKE 1 TABLET BY MOUTH THREE TIMES DAILY AS NEEDED FOR DIZZINESS (Patient taking differently: Take 25 mg by mouth 3 (three) times daily as needed for nausea or dizziness.), Disp: 30 tablet, Rfl: 11   memantine (NAMENDA) 5 MG tablet, TAKE 2 TABLETS BY MOUTH EVERY DAY and TAKE 1 TABLET BY MOUTH EVERY EVENING (Patient taking differently: Take 5-10 mg by mouth See admin instructions. TAKE 2 TABLETS BY MOUTH EVERY DAY and TAKE 1 TABLET BY MOUTH EVERY EVENING), Disp: 270 tablet, Rfl: 5   nitroGLYCERIN (NITROSTAT) 0.4 MG SL tablet,  Take one tablet under the tongue every 5 minutes as needed for chest pain (Patient taking differently: Place 0.4 mg under the tongue every 5 (five) minutes as needed for chest pain.), Disp: 50 tablet, Rfl: 0   NOVOLOG FLEXPEN 100 UNIT/ML FlexPen, Inject 14 Units into the skin 2 (two) times daily with a meal. Max daily dose 70 units, Disp: 60 mL, Rfl: 0   nystatin cream (MYCOSTATIN), Apply 1 Application topically 2 (two) times daily., Disp: 30 g, Rfl: 3   ondansetron (ZOFRAN) 4 MG tablet, Take 1 tablet (4 mg total) by mouth every 8 (eight) hours as needed for nausea or vomiting., Disp: 20 tablet, Rfl: 0   [START ON 05/30/2022] oxyCODONE-acetaminophen (PERCOCET) 7.5-325 MG tablet, Take 1 tablet by mouth every 12 (twelve) hours., Disp: 60 tablet, Rfl: 0   pantoprazole (PROTONIX) 20 MG tablet, TAKE 1 TABLET BY  MOUTH EVERY DAY, Disp: 30 tablet, Rfl: 5   sodium polystyrene (SPS) 15 GM/60ML suspension, take 41ms by MOUTH ONCE WEEKLY. TO keep potassium down (Patient taking differently: Take 15 g by mouth every 7 (seven) days. To keep potassium down), Disp: 240 mL, Rfl: 2   Suvorexant (BELSOMRA) 5 MG TABS, Take 5 mg by mouth at bedtime as needed (insomnia)., Disp: 30 tablet, Rfl: 0   torsemide (DEMADEX) 20 MG tablet, TAKE 1 TABLET BY MOUTH EVERY DAY - Take an extra dose for weight gain, 3lb in 1 day or 5lbs in 1 week, Disp: 30 tablet, Rfl: 0   Vitamin D, Ergocalciferol, (DRISDOL) 1.25 MG (50000 UNIT) CAPS capsule, TAKE 1 CAPSULE BY MOUTH every 7 days, Disp: 4 capsule, Rfl: 1   guaifenesin (ROBITUSSIN) 100 MG/5ML syrup, Take 200 mg by mouth 3 (three) times daily as needed for cough. (Patient not taking: Reported on 04/20/2022), Disp: , Rfl:   Cognitive Assessment Identity Confirmed: : Name; DOB (HIPAA verified per daughter Nichole) Cognitive Status: Normal   Functional Assessment Hearing Difficulty or Deaf: no Wear Glasses or Blind: yes Vision Management: can see well w/ glasses Concentrating, Remembering or  Making Decisions Difficulty (CP): yes Concentration Management: has slight dementia per daughter Difficulty Communicating: no Difficulty Eating/Swallowing: no Walking or Climbing Stairs Difficulty: yes Walking or Climbing Stairs: ambulation difficulty, requires equipment Mobility Management: has walker and family assists as needed Dressing/Bathing Difficulty: yes Dressing/Bathing: bathing difficulty, assistance 1 person Dressing/Bathing Management: has shower seat, family assists Doing Errands Independently Difficulty (such as shopping) (CP): yes Errands Management: family assists   Caregiver Assessment  Primary Source of Support/Comfort: child(ren) Name of Support/Comfort Primary Source: adult daughter Nichole Maladylives with patient, other daughter assists with meals, etc People in Home: child(ren), adult Name(s) of People in Home: [name]   Planned Interventions  Evaluation of current treatment plan related to hypertension self management and patient's adherence to plan as established by provider;   Reviewed prescribed diet low sodium Reviewed medications with patient and discussed importance of compliance;  Counseled on the importance of exercise goals with target of 150 minutes per week Discussed plans with patient for ongoing care management follow up and provided patient with direct contact information for care management team; Advised patient, providing education and rationale, to monitor blood pressure daily and record, calling PCP for findings outside established parameters;  Provided education on prescribed diet low sodium;  Discussed complications of poorly controlled blood pressure such as heart disease, stroke, circulatory complications, vision complications, kidney impairment, sexual dysfunction;  Screening for signs and symptoms of depression related to chronic disease state;  Assessed social determinant of health barriers;  Provided education to patient about basic DM  disease process; Reviewed medications with patient and discussed importance of medication adherence;        Reviewed prescribed diet with patient carbohydrate modified; Counseled on importance of regular laboratory monitoring as prescribed;        Discussed plans with patient for ongoing care management follow up and provided patient with direct contact information for care management team;      Provided patient with written educational materials related to hypo and hyperglycemia and importance of correct treatment;       call provider for findings outside established parameters;       Review of patient status, including review of consultants reports, relevant laboratory and other test results, and medications completed;       Advised patient to discuss hypoglycemia with provider;  Screening for signs and symptoms of depression related to chronic disease state;        Assessed social determinant of health barriers;        Message sent to Dr. Kelton Pillar (endocrinology), reporting pt has had hypoglycemic episodes over the past week with readings 45-51 (occurring around 530 am) and pt is symptomatic  Interaction and coordination with outside resources, practitioners, and providers See CCM Referral  Care Plan: Available in MyChart

## 2022-04-21 ENCOUNTER — Telehealth: Payer: Self-pay | Admitting: Family Medicine

## 2022-04-21 NOTE — Telephone Encounter (Signed)
Will from [facility] is needing verbal orders for pt--OT extended, orders for 2x/1w and 1x/2w. Will at [phone] is secure.

## 2022-04-21 NOTE — Telephone Encounter (Signed)
Spoke with Will and he verbalized understanding.

## 2022-04-24 ENCOUNTER — Other Ambulatory Visit: Payer: Self-pay | Admitting: Family Medicine

## 2022-04-24 ENCOUNTER — Ambulatory Visit (INDEPENDENT_AMBULATORY_CARE_PROVIDER_SITE_OTHER): Payer: PPO | Admitting: Family Medicine

## 2022-04-24 ENCOUNTER — Encounter: Payer: Self-pay | Admitting: Family Medicine

## 2022-04-24 ENCOUNTER — Telehealth: Payer: Self-pay

## 2022-04-24 VITALS — BP 130/84 | HR 75

## 2022-04-24 DIAGNOSIS — J302 Other seasonal allergic rhinitis: Secondary | ICD-10-CM

## 2022-04-24 DIAGNOSIS — I5032 Chronic diastolic (congestive) heart failure: Secondary | ICD-10-CM

## 2022-04-24 DIAGNOSIS — M7989 Other specified soft tissue disorders: Secondary | ICD-10-CM

## 2022-04-24 DIAGNOSIS — J301 Allergic rhinitis due to pollen: Secondary | ICD-10-CM

## 2022-04-24 DIAGNOSIS — E875 Hyperkalemia: Secondary | ICD-10-CM

## 2022-04-24 DIAGNOSIS — Z789 Other specified health status: Secondary | ICD-10-CM

## 2022-04-24 DIAGNOSIS — G5603 Carpal tunnel syndrome, bilateral upper limbs: Secondary | ICD-10-CM | POA: Diagnosis not present

## 2022-04-24 DIAGNOSIS — I82411 Acute embolism and thrombosis of right femoral vein: Secondary | ICD-10-CM

## 2022-04-24 MED ORDER — B & B CARPAL TUNNEL BRACE MISC: 10 refills | Status: DC

## 2022-04-24 MED ORDER — MONTELUKAST SODIUM 10 MG PO TABS: 10.0000 mg | ORAL_TABLET | Freq: Every day | ORAL | 3 refills | Status: DC

## 2022-04-24 NOTE — Patient Instructions (Signed)
For right arm swelling, we are ordering an ultrasound.  The office will call to schedule this.  For your congestion and allergies, we are adding montelukast.  For carpal tunnel, we are ordering wrist brace.

## 2022-04-24 NOTE — Telephone Encounter (Signed)
-----   Message from Cloyd Stagers, MD sent at 04/24/2022  8:06 AM EST ----- Regarding: FW: update Please confirm with the family member how much of Toujeo is the patient taking?  She should be on 42 units daily   If the patient is on higher dose than 42 please make sure they reduce it to 42.  If they confirm that they are given the patient 42 units please asked him to reduce it to 40 units daily ----- Message ----- From: Kassie Mends, RN Sent: 04/20/2022   3:20 PM EST To: Cloyd Stagers, MD Subject: update                                         Spoke w/ patient's daughter [name] today who reports over the past week, pt has been waking up around 530 am with CBG around 45-51 and has happened several times, she states pt is symptomatic and doesn't feel well, she says they have not been doing a hs snack and she is going to start to see if this will help, just wanted you to be aware and she feels the [facility] is reading correctly.   thanks

## 2022-04-24 NOTE — Progress Notes (Addendum)
Assessment/Plan:   Problem List Items Addressed This Visit       Cardiovascular and Mediastinum   Chronic diastolic CHF (congestive heart failure) (HCC)     Respiratory   Allergic rhinitis    Allergic Rhinitis: Likely considering her symptoms and history. Nonallergic Rhinitis: Less likely as the symptoms are seasonal. Plan:  Continue with Montelukast treatment Consider allergy testing for pinpointing the allergen        Nervous and Auditory   Carpal tunnel syndrome on both sides - Primary    Differential Diagnosis: Cervical radiculopathy, peripheral neuropathy, tendonitis. Plan: Consider wearing carpal tunnel brace (B & B Carpal Tunnel Brace) at night. Recommend occupational therapy for typing techniques, but patient declined. Follow-up in 4 weeks to reassess symptoms and possible need for EMG.      Relevant Medications   Elastic Bandages & Supports (B & B CARPAL TUNNEL BRACE) MISC     Other   Swelling of right upper extremity    Differential Diagnosis: Recurrent DVT, Post-thrombotic Syndrome, Cellulitis, Baker's cyst, Lymphedema. Plan: Continue with Apixaban as per dosage. Monitor or seek immediate medical attention for any signs of bleeding. Follow-up with regular vascular ultrasounds.      Relevant Orders   VAS [place] UPPER EXTREMITY VENOUS DUPLEX   Very limited skin mobility    Patient has extensive limited mobility predominantly has to be in a wheelchair from CHF, diabetes with extensive peripheral neuropathy, chronic vertigo, COPD, etc. Patient's limited mobility impairs her ability to do mobility related ADLs including dressing, safe transfer to bed or chair, toileting, food preparation.  A cane, walker, crutches will not resolve the issue.  Patient has caregiver that can propel the wheelchair.        Other Visit Diagnoses     Seasonal allergies       Relevant Medications   montelukast (SINGULAIR) 10 MG tablet          Subjective:  HPI:  Patient is  a 87 y.o. female who has Hyperlipidemia; Class 3 obesity ([name]); Anemia; TENSION HEADACHE; Essential hypertension; Chronic diastolic CHF (congestive heart failure) ([name]); Long term current use of anticoagulant therapy; Insomnia; Atypical chest pain; Estrogen deficiency; COPD with chronic bronchitis; CN (constipation); Gout; Acute kidney injury superimposed on chronic kidney disease ([place]); Vitamin D deficiency; Endometrial polyp; GERD with esophagitis; Dementia ([name]); Type 2 diabetes mellitus with hyperlipidemia ([name]); Moderate nonproliferative diabetic retinopathy of both eyes without macular edema associated with type 2 diabetes mellitus ([name]); Chronic radicular lumbar pain; Lumbar spondylosis; Spinal stenosis, lumbar region, with neurogenic claudication; Bilateral primary osteoarthritis of knee; Iron deficiency anemia; Right elbow pain; Chronic pain of right hand; Breast cancer ([place]); Anxiety; Acute on chronic diastolic (congestive) heart failure ([name]); AKI (acute kidney injury) ([name]); Severe episode of recurrent major depressive disorder, without psychotic features ([name]); Pneumobilia; Dermatosis papulosa nigra; Positive D dimer; CKD (chronic kidney disease), stage IV ([name]); Type 2 diabetes mellitus with stage 4 chronic kidney disease, with long-term current use of insulin ([name]); Type 2 diabetes mellitus with diabetic neuropathy, with long-term current use of insulin ([name]); DM2 (diabetes mellitus, type 2) ([name]); Acute upper GI bleed; Black stool; Acute deep vein thrombosis (DVT) of femoral vein of right lower extremity ([name]); Stage 3b chronic kidney disease (CKD) ([name]); COPD (chronic obstructive pulmonary disease) ([name]); History of anemia due to chronic kidney disease; GAD (generalized anxiety disorder); Allergic rhinitis; GI bleed; Diabetic peripheral neuropathy associated with type 2 diabetes mellitus ([name]); Hypoglycemia; Swelling of right upper extremity; Carpal tunnel syndrome on  both sides; and Very limited  skin mobility on their problem list..   She  has a past medical history of Abdominal pain (01/26/2012), Acute kidney injury superimposed on chronic kidney disease ([name]) (12/18/2014), Allergy, Anemia, unspecified, Anxiety, Anxiety associated with depression (06/08/2017), Arthritis, Breast cancer ([name]), Breast cancer screening (02/19/2014), Breast mass (10/27/2014), Chronic diastolic CHF (congestive heart failure) ([place]), Chronic pain syndrome (02/06/2015), CKD (chronic kidney disease), stage III ([name]), Coronary artery disease, Debility (08/01/2012), Dementia ([name]), Depression, Diabetes mellitus, DOE (dyspnea on exertion) (06/24/2014), Dysphagia, unspecified(787.20) (07/31/2012), Fungal dermatitis (11/13/2007), GERD (gastroesophageal reflux disease), Gout, unspecified, Headache, History of blood clots, History of deep venous thrombosis (01/27/2012), History of pulmonary embolus (PE) (04/22/2014), Hyperkalemia (07/26/2017), Hyperlipidemia, Hypertension, Insomnia, Joint pain, Joint swelling, Morbid obesity ([name]), Multiple benign nevi (11/15/2016), Myocardial infarction ([name]) (2004), Non-STEMI (non-ST elevated myocardial infarction) ([name]) (02/15/2020), Numbness, Obstructive sleep apnea, Osteoarthritis, Osteoarthritis, Osteoarthrosis, unspecified whether generalized or localized, lower leg, Pain in the chest (12/31/2013), Pain, chronic, Personal history of other diseases of the digestive system (12/03/2006), Polymyalgia rheumatica ([name]), Presence of stent in right coronary artery, Pulmonary emboli ([name]) (01/2012), Situational depression (06/04/2009), Syncope, vasovagal (07/04/2021), Type 2 diabetes mellitus with hyperlipidemia ([name]) (02/16/2022), Urinary incontinence, Vaginitis and vulvovaginitis (06/23/2016), Vertigo, and Wears dentures..   Chief Complain: The patient is an 87 year old female who presents with several chronic conditions. Her main complaint is swelling in the right arm and leg for several months. The  patient also complained about an ongoing seasonal allergy and is concerned about recently developed sensations of tingling in her hands, which is suspected to be carpal tunnel syndrome.  History of Present Illness: The patient has been dealing with a persistent swelling of her right upper extremity for a few months. The swelling has not worsened, and she experiences no pain, shortness of breath, or chest discomfort. She was recently diagnosed with DVT in her right lower leg and is currently on apixaban for anticoagulation treatment.  The patient also complains of seasonal allergies that have been ongoing for several years without relief, despite attempts with Flonase and over-the-counter antihistamines.  She presents today for an evaluation of hand paresthesias, suspecting carpal tunnel syndrome. The problem started several years ago with symptoms worsening gradually. Aggravating factors include a repetitive activity, especially at night or early in the morning. No treatments or evaluations have been undertaken so far.  Review of Systems: The patient showed no symptoms of heart disease, lung disease, or gastrointestinal disorders.  Past Surgical History:  Procedure Laterality Date   ABDOMINAL HYSTERECTOMY     partial   APPENDECTOMY     blood clots/legs and lungs  2013   BREAST BIOPSY Left 07/22/2014   BREAST BIOPSY Left 02/10/2013   BREAST LUMPECTOMY Left 11/05/2014   BREAST LUMPECTOMY WITH RADIOACTIVE SEED LOCALIZATION Left 11/05/2014   Procedure: LEFT BREAST LUMPECTOMY WITH RADIOACTIVE SEED LOCALIZATION;  Surgeon: [name], MD;  Location: [place];  Service: General;  Laterality: Left;   CARDIAC CATHETERIZATION     COLONOSCOPY     CORONARY ANGIOPLASTY  2   CORONARY STENT INTERVENTION N/A 02/17/2020   Procedure: CORONARY STENT INTERVENTION;  Surgeon: Leonie Man, MD;  Location: [place] [facility];  Service: Cardiovascular;  Laterality: N/A;   ESOPHAGOGASTRODUODENOSCOPY (EGD) WITH  PROPOFOL N/A 11/07/2016   Procedure: ESOPHAGOGASTRODUODENOSCOPY (EGD) WITH PROPOFOL;  Surgeon: Gatha Mayer, MD;  Location: [facility];  Service: Endoscopy;  Laterality: N/A;   EXCISION OF SKIN TAG Right 11/05/2014   Procedure: EXCISION OF RIGHT EYELID SKIN TAG;  Surgeon: [name], MD;  Location: [place];  Service: General;  Laterality: Right;   EYE SURGERY Bilateral    cataract    GASTRIC BYPASS  1977    reversed in 1979, [place] CATH AND CORONARY ANGIOGRAPHY N/A 02/17/2020   Procedure: LEFT HEART CATH AND CORONARY ANGIOGRAPHY;  Surgeon: Leonie Man, MD;  Location: [facility] [facility];  Service: Cardiovascular;  Laterality: N/A;   LEFT HEART CATHETERIZATION WITH CORONARY ANGIOGRAM N/A 06/29/2014   Procedure: LEFT HEART CATHETERIZATION WITH CORONARY ANGIOGRAM;  Surgeon: Troy Sine, MD;  [facility];  Service: Cardiovascular;  Laterality: N/A;   MEMBRANE PEEL Right 10/23/2018   Procedure: MEMBRANE PEEL;  Surgeon: Hurman Horn, MD;  Location: [place];  Service: Ophthalmology;  Laterality: Right;   MI with stent placement  2004   PARS PLANA VITRECTOMY Right 10/23/2018   Procedure: PARS PLANA VITRECTOMY WITH 25 GAUGE;  Surgeon: Hurman Horn, MD;  Location: [place];  Service: Ophthalmology;  Laterality: Right;    Outpatient Medications Prior to Visit  Medication Sig Dispense Refill   albuterol (PROAIR HFA) 108 (90 Base) MCG/ACT inhaler Inhale 1-2 puffs into the lungs every 6 (six) hours as needed for wheezing or shortness of breath. 6.7 g 1   anastrozole (ARIMIDEX) 1 MG tablet TAKE 1 TABLET BY MOUTH EVERY DAY 28 tablet 1   apixaban (ELIQUIS) 5 MG TABS tablet Take 1 tablet (5 mg total) by mouth 2 (two) times daily. 180 tablet 3   atorvastatin (LIPITOR) 80 MG tablet TAKE 1 TABLET BY MOUTH EVERY DAY 90 tablet 3   carvedilol (COREG) 12.5 MG tablet TAKE 1 TABLET BY MOUTH 2 TIMES DAILY 180 tablet 1   cetirizine (ZYRTEC) 10 MG tablet Take 10 mg by mouth daily  as needed for allergies or rhinitis.     Continuous Blood Gluc Sensor (FREESTYLE LIBRE 14 DAY SENSOR) MISC PLACE 1 DEVICE ON THE SKIN AS DIRECTED EVERY 14 DAYS 6 each 2   diclofenac Sodium (VOLTAREN) 1 % GEL Apply 2 g topically 4 (four) times daily. (Patient taking differently: Apply 2 g topically 4 (four) times daily as needed (pain).) 150 g 2   febuxostat (ULORIC) 40 MG tablet Take 1 tablet (40 mg total) by mouth daily. 90 tablet 3   fluticasone (FLONASE) 50 MCG/ACT nasal spray Place 1 spray into both nostrils in the morning and at bedtime. (Patient taking differently: Place 1 spray into both nostrils 2 (two) times daily as needed for rhinitis or allergies.) 37.1 mL 3   folic acid (FOLVITE) 1 MG tablet TAKE 1 TABLET BY MOUTH EVERY DAY 90 tablet 1   glucagon 1 MG injection Inject 1 mg into the vein once as needed for up to 1 dose. 1 each 12   glucose 4 GM chewable tablet Chew 1 tablet (4 g total) by mouth as needed for low blood sugar. 50 tablet 12   guaifenesin (ROBITUSSIN) 100 MG/5ML syrup Take 200 mg by mouth 3 (three) times daily as needed for cough.     insulin glargine, 2 Unit Dial, (TOUJEO MAX SOLOSTAR) 300 UNIT/ML Solostar Pen Inject 42 Units into the skin daily. Patient assistance program provides (Patient taking differently: Inject 40 Units into the skin daily. Patient assistance program provides. Patient reports 40 units 04/24/2022) 15 mL 6   linaclotide (LINZESS) 145 MCG CAPS capsule Take 1 capsule (145 mcg total) by mouth daily before breakfast. 30 capsule 2   meclizine (ANTIVERT) 25  MG tablet TAKE 1 TABLET BY MOUTH THREE TIMES DAILY AS NEEDED FOR DIZZINESS 30 tablet 11   memantine (NAMENDA) 5 MG tablet TAKE 2 TABLETS BY MOUTH EVERY DAY and TAKE 1 TABLET BY MOUTH EVERY EVENING (Patient taking differently: Take 5-10 mg by mouth See admin instructions. TAKE 2 TABLETS BY MOUTH EVERY DAY and TAKE 1 TABLET BY MOUTH EVERY EVENING) 270 tablet 5   nitroGLYCERIN (NITROSTAT) 0.4 MG SL tablet Take  one tablet under the tongue every 5 minutes as needed for chest pain 50 tablet 0   NOVOLOG FLEXPEN 100 UNIT/ML FlexPen Inject 14 Units into the skin 2 (two) times daily with a meal. Max daily dose 70 units 60 mL 0   nystatin cream (MYCOSTATIN) Apply 1 Application topically 2 (two) times daily. 30 g 3   ondansetron (ZOFRAN) 4 MG tablet Take 1 tablet (4 mg total) by mouth every 8 (eight) hours as needed for nausea or vomiting. 20 tablet 0   [START ON 05/30/2022] oxyCODONE-acetaminophen (PERCOCET) 7.5-325 MG tablet Take 1 tablet by mouth every 12 (twelve) hours. 60 tablet 0   sodium polystyrene (SPS) 15 GM/60ML suspension TAKE 60MLS BY MOUTH ONCE WEEKLY TO KEEP POTASSIUM DOWN 240 mL 0   budesonide-formoterol (SYMBICORT) 160-4.5 MCG/ACT inhaler Inhale 2 puffs into the lungs 2 (two) times daily. 1 each 3   DULoxetine (CYMBALTA) 30 MG capsule Take 1 capsule (30 mg total) by mouth daily. 90 capsule 0   pantoprazole (PROTONIX) 20 MG tablet TAKE 1 TABLET BY MOUTH EVERY DAY 30 tablet 5   Suvorexant (BELSOMRA) 5 MG TABS Take 5 mg by mouth at bedtime as needed (insomnia). 30 tablet 0   torsemide (DEMADEX) 20 MG tablet TAKE 1 TABLET BY MOUTH EVERY DAY - Take an extra dose for weight gain, 3lb in 1 day or 5lbs in 1 week 30 tablet 0   Vitamin D, Ergocalciferol, (DRISDOL) 1.25 MG (50000 UNIT) CAPS capsule TAKE 1 CAPSULE BY MOUTH every 7 days 4 capsule 1   No facility-administered medications prior to visit.    Family History  Problem Relation Age of Onset   Breast cancer Mother 43   Heart disease Mother    Throat cancer Father    Hypertension Father    Arthritis Father    Diabetes Father    Arthritis Sister    Obesity Sister    Diabetes Sister    Heart disease Cousin    Colon cancer Neg Hx    Stomach cancer Neg Hx    Esophageal cancer Neg Hx     Social History   Socioeconomic History   Marital status: Widowed    Spouse name: Not on file   Number of children: 3   Years of education: Not on file    Highest education level: High school graduate  Occupational History   Occupation: retired  Tobacco Use   Smoking status: Never    Passive exposure: Past   Smokeless tobacco: Never   Tobacco comments:    Both parents smoked, patient was exposed/ "Raised up in smoke, my whole life."  Vaping Use   Vaping Use: Never used  Substance and Sexual Activity   Alcohol use: No    Alcohol/week: 0.0 standard drinks of alcohol   Drug use: No   Sexual activity: Not Currently  Other Topics Concern   Not on file  Social History Narrative   Not on file   Social Determinants of Health   Financial Resource Strain: Low Risk  (04/20/2022)  Overall Financial Resource Strain (CARDIA)    Difficulty of Paying Living Expenses: Not very hard  Food Insecurity: No Food Insecurity (04/20/2022)   Hunger Vital Sign    Worried About Running Out of Food in the Last Year: Never true    Ran Out of Food in the Last Year: Never true  Transportation Needs: No Transportation Needs (04/20/2022)   PRAPARE - Hydrologist (Medical): No    Lack of Transportation (Non-Medical): No  Physical Activity: Insufficiently Active (04/20/2022)   Exercise Vital Sign    Days of Exercise per Week: 2 days    Minutes of Exercise per Session: 10 min  Stress: No Stress Concern Present (04/20/2022)   [place]    Feeling of Stress : Only a little  Social Connections: Moderately Isolated (04/20/2022)   Social Connection and Isolation Panel [NHANES]    Frequency of Communication with Friends and Family: Once a week    Frequency of Social Gatherings with Friends and Family: Once a week    Attends Religious Services: 1 to 4 times per year    Active Member of [employer] or Organizations: Yes    Attends Archivist Meetings: 1 to 4 times per year    Marital Status: Widowed  Intimate Partner Violence: Not At Risk (04/20/2022)    Humiliation, Afraid, Rape, and Kick questionnaire    Fear of Current or Ex-Partner: No    Emotionally Abused: No    Physically Abused: No    Sexually Abused: No                                                                                                 Objective:  Physical Exam: BP 130/84 (BP Location: Left Arm, Patient Position: Sitting, Cuff Size: Large)   Pulse 75    Gen: wheel chair bound, NAD, resting comfortably, obsiety CV: RRR with no murmurs appreciated Pulm: NWOB, CTAB with no crackles, wheezes, or rhonchi GI: Normal bowel sounds present. Soft, Nontender, Nondistended. MSK: right upper arm swelling, nontender, swelling improved in right lower leg Skin: warm, dry Neuro: grossly normal, moves all extremities Psych: Normal affect and thought content       Alesia Banda, MD, MS

## 2022-04-24 NOTE — Telephone Encounter (Signed)
Spoke with patient daughter and she states that she is taking the 42 units of Toujeo. Advise her to decrease to 40 units

## 2022-04-25 ENCOUNTER — Telehealth (HOSPITAL_BASED_OUTPATIENT_CLINIC_OR_DEPARTMENT_OTHER): Payer: Self-pay | Admitting: *Deleted

## 2022-04-25 NOTE — Telephone Encounter (Signed)
Left message for patient to call and schedule the upper venous duplex ordered by Dr. Josephine Igo

## 2022-04-26 DIAGNOSIS — F0390 Unspecified dementia without behavioral disturbance: Secondary | ICD-10-CM | POA: Diagnosis not present

## 2022-04-26 DIAGNOSIS — F419 Anxiety disorder, unspecified: Secondary | ICD-10-CM

## 2022-04-26 DIAGNOSIS — I252 Old myocardial infarction: Secondary | ICD-10-CM | POA: Diagnosis not present

## 2022-04-26 DIAGNOSIS — Z853 Personal history of malignant neoplasm of breast: Secondary | ICD-10-CM

## 2022-04-26 DIAGNOSIS — I2510 Atherosclerotic heart disease of native coronary artery without angina pectoris: Secondary | ICD-10-CM | POA: Diagnosis not present

## 2022-04-26 DIAGNOSIS — E785 Hyperlipidemia, unspecified: Secondary | ICD-10-CM

## 2022-04-26 DIAGNOSIS — Z7902 Long term (current) use of antithrombotics/antiplatelets: Secondary | ICD-10-CM

## 2022-04-26 DIAGNOSIS — E1140 Type 2 diabetes mellitus with diabetic neuropathy, unspecified: Secondary | ICD-10-CM | POA: Diagnosis not present

## 2022-04-26 DIAGNOSIS — Z6841 Body Mass Index (BMI) 40.0 and over, adult: Secondary | ICD-10-CM

## 2022-04-26 DIAGNOSIS — G894 Chronic pain syndrome: Secondary | ICD-10-CM | POA: Diagnosis not present

## 2022-04-26 DIAGNOSIS — J449 Chronic obstructive pulmonary disease, unspecified: Secondary | ICD-10-CM | POA: Diagnosis not present

## 2022-04-26 DIAGNOSIS — I130 Hypertensive heart and chronic kidney disease with heart failure and stage 1 through stage 4 chronic kidney disease, or unspecified chronic kidney disease: Secondary | ICD-10-CM | POA: Diagnosis not present

## 2022-04-26 DIAGNOSIS — Z86718 Personal history of other venous thrombosis and embolism: Secondary | ICD-10-CM

## 2022-04-26 DIAGNOSIS — Z7901 Long term (current) use of anticoagulants: Secondary | ICD-10-CM

## 2022-04-26 DIAGNOSIS — E1122 Type 2 diabetes mellitus with diabetic chronic kidney disease: Secondary | ICD-10-CM | POA: Diagnosis not present

## 2022-04-26 DIAGNOSIS — I5032 Chronic diastolic (congestive) heart failure: Secondary | ICD-10-CM | POA: Diagnosis not present

## 2022-04-26 DIAGNOSIS — Z794 Long term (current) use of insulin: Secondary | ICD-10-CM | POA: Diagnosis not present

## 2022-04-26 DIAGNOSIS — N1832 Chronic kidney disease, stage 3b: Secondary | ICD-10-CM | POA: Diagnosis not present

## 2022-04-26 DIAGNOSIS — D638 Anemia in other chronic diseases classified elsewhere: Secondary | ICD-10-CM | POA: Diagnosis not present

## 2022-04-26 NOTE — Telephone Encounter (Signed)
Called and spoke with patient to discuss scheduling the upper extremity venous duplex ordered by Dr. Josephine Igo.  Patient states she is not gong to have this done right now and will call back when she is ready.  Will forward information to Dr. Grandville Silos.

## 2022-04-27 DIAGNOSIS — M7989 Other specified soft tissue disorders: Secondary | ICD-10-CM | POA: Insufficient documentation

## 2022-04-27 DIAGNOSIS — G5603 Carpal tunnel syndrome, bilateral upper limbs: Secondary | ICD-10-CM | POA: Insufficient documentation

## 2022-04-27 NOTE — Assessment & Plan Note (Signed)
Differential Diagnosis: Recurrent DVT, Post-thrombotic Syndrome, Cellulitis, Baker's cyst, Lymphedema. Plan: Continue with Apixaban as per dosage. Monitor or seek immediate medical attention for any signs of bleeding. Follow-up with regular vascular ultrasounds.

## 2022-04-27 NOTE — Assessment & Plan Note (Signed)
Differential Diagnosis: Cervical radiculopathy, peripheral neuropathy, tendonitis. Plan: Consider wearing carpal tunnel brace (B & B Carpal Tunnel Brace) at night. Recommend occupational therapy for typing techniques, but patient declined. Follow-up in 4 weeks to reassess symptoms and possible need for EMG.

## 2022-04-27 NOTE — Assessment & Plan Note (Signed)
Allergic Rhinitis: Likely considering her symptoms and history. Nonallergic Rhinitis: Less likely as the symptoms are seasonal. Plan:  Continue with Montelukast treatment Consider allergy testing for pinpointing the allergen

## 2022-05-02 ENCOUNTER — Telehealth: Payer: Self-pay | Admitting: Family Medicine

## 2022-05-02 DIAGNOSIS — I5032 Chronic diastolic (congestive) heart failure: Secondary | ICD-10-CM

## 2022-05-02 DIAGNOSIS — Z7409 Other reduced mobility: Secondary | ICD-10-CM

## 2022-05-02 DIAGNOSIS — E1142 Type 2 diabetes mellitus with diabetic polyneuropathy: Secondary | ICD-10-CM

## 2022-05-02 NOTE — Telephone Encounter (Signed)
Patient scheduled on 05/05/2022 at 4pm  with Terence Lux.   Forwarding message to Dr. Grandville Silos regarding rollater rx. Patient's daughter pam verbalized understanding.

## 2022-05-02 NOTE — Telephone Encounter (Signed)
Caller Name: Afsa Meany Call back phone #: [phone]  Reason for Call: Pt has broken her rollater walker and is requesting for a new prescription to be sent. Also wanted it to be filed that stool is black and she believes it is coming from eliquis

## 2022-05-04 ENCOUNTER — Ambulatory Visit: Payer: HMO | Admitting: Family Medicine

## 2022-05-05 ENCOUNTER — Encounter: Payer: Self-pay | Admitting: Nurse Practitioner

## 2022-05-05 ENCOUNTER — Ambulatory Visit (INDEPENDENT_AMBULATORY_CARE_PROVIDER_SITE_OTHER): Payer: PPO | Admitting: Nurse Practitioner

## 2022-05-05 ENCOUNTER — Encounter: Payer: PPO | Admitting: Nurse Practitioner

## 2022-05-05 VITALS — BP 130/80 | HR 66 | Temp 97.0°F | Ht 67.0 in | Wt 287.4 lb

## 2022-05-05 DIAGNOSIS — D649 Anemia, unspecified: Secondary | ICD-10-CM | POA: Diagnosis not present

## 2022-05-05 DIAGNOSIS — K921 Melena: Secondary | ICD-10-CM

## 2022-05-05 NOTE — Patient Instructions (Signed)
It was great to see you!  We are checking your labs today and will let you know the results via mychart/phone.   She can take belsomra 1 tablet at bedtime as needed for sleep.   I am sending you home with stool cards and bring back on Tuesday.   Let's follow-up in 2-3 weeks, sooner if you have concerns.  If a referral was placed today, you will be contacted for an appointment. Please note that routine referrals can sometimes take up to 3-4 weeks to process. Please call our office if you haven't heard anything after this time frame.  Take care,  Vance Peper, NP

## 2022-05-05 NOTE — Assessment & Plan Note (Signed)
Blood counts have been low over the past several readings.  With black stool, will recheck CBC and also check iron panel, vitamin B12, folate today.  Follow-up with PCP in 2 to 3 weeks.

## 2022-05-05 NOTE — Addendum Note (Signed)
Addended by: Josephine Igo B on: 05/05/2022 12:17 PM   Modules accepted: Orders

## 2022-05-05 NOTE — Assessment & Plan Note (Signed)
No red flags on exam. She had 1 black stool on Monday, however since then her bowel movements have been normal.  Will check a CBC today and also send her home with a Hemoccult card.  Her daughter states that she will bring it back on Tuesday.  Follow-up with PCP in 2 to 3 weeks.

## 2022-05-05 NOTE — Progress Notes (Signed)
Acute Office Visit  Subjective:     Patient ID: [name], female    DOB: 11-28-1934, 87 y.o.   MRN: [id number]  Chief Complaint  Patient presents with   Acute Visit    C/o black stool on Monday 12/26, only happened once, hasn't seen it again     HPI Patient is in today for a black stool that happened on Monday.  She is taking Eliquis and is concerned for bleeding.  She states that this only happened 1 time, and since then her stools have been normal.  She denies abdominal pain, diarrhea, nausea, vomiting.  She does have trouble with constipation and takes Linzess regularly.  ROS See pertinent positives and negatives per HPI.    Objective:    BP 130/80 (BP Location: Right Arm, Patient Position: Sitting, Cuff Size: Normal)   Pulse 66   Temp (!) 97 F (36.1 C) (Temporal)   Ht '5\' 7"'$  (1.702 m)   Wt 287 lb 6.4 oz (130.4 kg)   SpO2 98%   BMI 45.01 kg/m    Physical Exam Vitals and nursing note reviewed.  Constitutional:      General: She is not in acute distress.    Appearance: Normal appearance.  HENT:     Head: Normocephalic.  Eyes:     Conjunctiva/sclera: Conjunctivae normal.  Cardiovascular:     Rate and Rhythm: Normal rate and regular rhythm.     Pulses: Normal pulses.     Heart sounds: Normal heart sounds.  Pulmonary:     Effort: Pulmonary effort is normal.     Breath sounds: Normal breath sounds.  Abdominal:     General: There is no distension.     Palpations: Abdomen is soft.     Tenderness: There is no abdominal tenderness. There is no guarding.     Hernia: No hernia is present.  Musculoskeletal:     Cervical back: Normal range of motion.  Skin:    General: Skin is warm.  Neurological:     General: No focal deficit present.     Mental Status: She is alert and oriented to person, place, and time.  Psychiatric:        Mood and Affect: Mood normal.        Behavior: Behavior normal.        Thought Content: Thought content normal.        Judgment:  Judgment normal.      Assessment & Plan:   Problem List Items Addressed This Visit       Other   Anemia    Blood counts have been low over the past several readings.  With black stool, will recheck CBC and also check iron panel, vitamin B12, folate today.  Follow-up with PCP in 2 to 3 weeks.      Relevant Orders   Folate   Iron, TIBC and Ferritin Panel   Vitamin B12   Black stool - Primary    No red flags on exam. She had 1 black stool on Monday, however since then her bowel movements have been normal.  Will check a CBC today and also send her home with a Hemoccult card.  Her daughter states that she will bring it back on Tuesday.  Follow-up with PCP in 2 to 3 weeks.      Relevant Orders   Hemoccult Cards (X3 cards)   Basic metabolic panel   CBC    No orders of the defined types were placed  in this encounter.   Return in about 3 weeks (around 05/26/2022) for 2-3 weeks with Dr. Grandville Silos for follow-up black stool, insomnia.  Charyl Dancer, NP

## 2022-05-06 LAB — CBC
HCT: 26.5 % — ABNORMAL LOW (ref 35.0–45.0)
Hemoglobin: 8.5 g/dL — ABNORMAL LOW (ref 11.7–15.5)
MCH: 29.3 pg (ref 27.0–33.0)
MCHC: 32.1 g/dL (ref 32.0–36.0)
MCV: 91.4 fL (ref 80.0–100.0)
MPV: 11.8 fL (ref 7.5–12.5)
Platelets: 266 10*3/uL (ref 140–400)
RBC: 2.9 10*6/uL — ABNORMAL LOW (ref 3.80–5.10)
RDW: 12.3 % (ref 11.0–15.0)
WBC: 14.4 10*3/uL — ABNORMAL HIGH (ref 3.8–10.8)

## 2022-05-06 LAB — BASIC METABOLIC PANEL
BUN/Creatinine Ratio: 30 (calc) — ABNORMAL HIGH (ref 6–22)
BUN: 46 mg/dL — ABNORMAL HIGH (ref 7–25)
CO2: 26 mmol/L (ref 20–32)
Calcium: 8.5 mg/dL — ABNORMAL LOW (ref 8.6–10.4)
Chloride: 100 mmol/L (ref 98–110)
Creat: 1.53 mg/dL — ABNORMAL HIGH (ref 0.60–0.95)
Glucose, Bld: 360 mg/dL — ABNORMAL HIGH (ref 65–99)
Potassium: 4.5 mmol/L (ref 3.5–5.3)
Sodium: 137 mmol/L (ref 135–146)

## 2022-05-06 LAB — IRON,TIBC AND FERRITIN PANEL
%SAT: 17 % (calc) (ref 16–45)
Ferritin: 254 ng/mL (ref 16–288)
Iron: 33 ug/dL — ABNORMAL LOW (ref 45–160)
TIBC: 193 mcg/dL (calc) — ABNORMAL LOW (ref 250–450)

## 2022-05-06 LAB — FOLATE: Folate: >24 ng/mL

## 2022-05-06 LAB — VITAMIN B12: Vitamin B-12: 484 pg/mL (ref 200–1100)

## 2022-05-07 DIAGNOSIS — Z794 Long term (current) use of insulin: Secondary | ICD-10-CM

## 2022-05-07 DIAGNOSIS — I10 Essential (primary) hypertension: Secondary | ICD-10-CM

## 2022-05-07 DIAGNOSIS — E1159 Type 2 diabetes mellitus with other circulatory complications: Secondary | ICD-10-CM

## 2022-05-09 ENCOUNTER — Telehealth: Payer: Self-pay

## 2022-05-09 NOTE — Telephone Encounter (Signed)
Received a call from [facility] requesting a OV note stating that wrist braces are necessary faxed to [phone]. OV note faxed successfully.

## 2022-05-10 ENCOUNTER — Telehealth: Payer: Self-pay

## 2022-05-10 DIAGNOSIS — I130 Hypertensive heart and chronic kidney disease with heart failure and stage 1 through stage 4 chronic kidney disease, or unspecified chronic kidney disease: Secondary | ICD-10-CM | POA: Diagnosis not present

## 2022-05-10 DIAGNOSIS — E1122 Type 2 diabetes mellitus with diabetic chronic kidney disease: Secondary | ICD-10-CM | POA: Diagnosis not present

## 2022-05-10 DIAGNOSIS — Z794 Long term (current) use of insulin: Secondary | ICD-10-CM | POA: Diagnosis not present

## 2022-05-10 DIAGNOSIS — I2510 Atherosclerotic heart disease of native coronary artery without angina pectoris: Secondary | ICD-10-CM | POA: Diagnosis not present

## 2022-05-10 DIAGNOSIS — E1140 Type 2 diabetes mellitus with diabetic neuropathy, unspecified: Secondary | ICD-10-CM | POA: Diagnosis not present

## 2022-05-10 DIAGNOSIS — F0390 Unspecified dementia without behavioral disturbance: Secondary | ICD-10-CM | POA: Diagnosis not present

## 2022-05-10 DIAGNOSIS — G894 Chronic pain syndrome: Secondary | ICD-10-CM | POA: Diagnosis not present

## 2022-05-10 DIAGNOSIS — J449 Chronic obstructive pulmonary disease, unspecified: Secondary | ICD-10-CM | POA: Diagnosis not present

## 2022-05-10 DIAGNOSIS — N1832 Chronic kidney disease, stage 3b: Secondary | ICD-10-CM | POA: Diagnosis not present

## 2022-05-10 DIAGNOSIS — I5032 Chronic diastolic (congestive) heart failure: Secondary | ICD-10-CM | POA: Diagnosis not present

## 2022-05-10 NOTE — Telephone Encounter (Signed)
Patient states that she contacted [facility] and they didn't receive rx that was faxed on 05/05/2022. I advised patient that it was faxed successfully. Patient states that she will call [facility] for their fax number and call back so I can refax it.

## 2022-05-12 ENCOUNTER — Other Ambulatory Visit: Payer: Self-pay | Admitting: Nurse Practitioner

## 2022-05-12 DIAGNOSIS — J449 Chronic obstructive pulmonary disease, unspecified: Secondary | ICD-10-CM

## 2022-05-14 NOTE — Progress Notes (Unsigned)
Office Visit    Patient Name: [name] Date of Encounter: 05/14/2022  Primary Care Provider:  Bonnita Hollow, MD Primary Cardiologist:  None Primary Electrophysiologist: None  Chief Complaint    [name] is a 88 y.o. female with PMH of CAD s/p BMS to RCA 2004 with DES x 2 to mid proximal RCA in 2012 with NSTEMI in 02/2020 with DES to RCA, HFpEF, IDDM type II, morbid obesity, HLD, COPD, CKD stage III breast CA, prior PE/DVT who presents today for overdue follow-up.  Past Medical History    Past Medical History:  Diagnosis Date   Abdominal pain 01/26/2012   Acute kidney injury superimposed on chronic kidney disease ([place]) 12/18/2014   Allergy    takes Mucinex daily as needed   Anemia, unspecified    Anxiety    takes Clonazepam daily as needed   Anxiety associated with depression 06/08/2017   Arthritis    Breast cancer ([place])    Breast cancer screening 02/19/2014   Breast mass 10/27/2014   Chronic diastolic CHF (congestive heart failure) ([place])    takes Furosemide daily   Chronic pain syndrome 02/06/2015   CKD (chronic kidney disease), stage III ([place])    Coronary artery disease    a. s/p IMI 2004 tx with BMS to RCA;  b. s/p Promus DES to RCA 2/12 c. abnormal nuc 2016 -> cath with med rx. d. NSTEMI 02/2020  mv CAD with severe ISR in pRCA and m/dRCA lesion both treated with DES.   Debility 08/01/2012   Dementia ([place])    Depression    takes Cymbalta daily   Diabetes mellitus    insulin daily   DOE (dyspnea on exertion) 06/24/2014   Dysphagia, unspecified(787.20) 07/31/2012   Fungal dermatitis 11/13/2007   Qualifier: Diagnosis of  By: Burnice Logan  MD, Doretha Sou    GERD (gastroesophageal reflux disease)    takes Protonix daily   Gout, unspecified    Headache    occasionally   History of blood clots    History of deep venous thrombosis 01/27/2012   History of pulmonary embolus (PE) 04/22/2014   Hyperkalemia 07/26/2017   Hyperlipidemia    takes Pravastatin  daily   Hypertension    Insomnia    Joint pain    Joint swelling    Morbid obesity ([place])    Multiple benign nevi 11/15/2016   Myocardial infarction ([place]) 2004   Non-STEMI (non-ST elevated myocardial infarction) ([place]) 02/15/2020   Numbness    Obstructive sleep apnea    does not wear cpap   Osteoarthritis    Osteoarthritis    Osteoarthrosis, unspecified whether generalized or localized, lower leg    Pain in the chest 12/31/2013   Pain, chronic    Personal history of other diseases of the digestive system 12/03/2006   Qualifier: Diagnosis of  By: Burnice Logan  MD, Doretha Sou    Polymyalgia rheumatica [facility])    Presence of stent in right coronary artery    Pulmonary emboli ([place]) 01/2012   felt to need lifelong anticoagulation but Xarelto dc in 02/2020 due to need for DAPT   Situational depression 06/04/2009   Qualifier: Diagnosis of  By: Burnice Logan  MD, Doretha Sou    Syncope, vasovagal 07/04/2021   Type 2 diabetes mellitus with hyperlipidemia ([place]) 02/16/2022   Urinary incontinence    takes Linzess daily   Vaginitis and vulvovaginitis 06/23/2016   Vertigo    hx of;was taking Meclizine if needed   Wears  dentures    Past Surgical History:  Procedure Laterality Date   ABDOMINAL HYSTERECTOMY     partial   APPENDECTOMY     blood clots/legs and lungs  2013   BREAST BIOPSY Left 07/22/2014   BREAST BIOPSY Left 02/10/2013   BREAST LUMPECTOMY Left 11/05/2014   BREAST LUMPECTOMY WITH RADIOACTIVE SEED LOCALIZATION Left 11/05/2014   Procedure: LEFT BREAST LUMPECTOMY WITH RADIOACTIVE SEED LOCALIZATION;  Surgeon: [name], MD;  Location: [place];  Service: General;  Laterality: Left;   CARDIAC CATHETERIZATION     COLONOSCOPY     CORONARY ANGIOPLASTY  2   CORONARY STENT INTERVENTION N/A 02/17/2020   Procedure: CORONARY STENT INTERVENTION;  Surgeon: Leonie Man, MD;  Location: [place] CV [facility];  Service: Cardiovascular;  Laterality: N/A;   ESOPHAGOGASTRODUODENOSCOPY (EGD) WITH  PROPOFOL N/A 11/07/2016   Procedure: ESOPHAGOGASTRODUODENOSCOPY (EGD) WITH PROPOFOL;  Surgeon: Gatha Mayer, MD;  Location: [facility];  Service: Endoscopy;  Laterality: N/A;   EXCISION OF SKIN TAG Right 11/05/2014   Procedure: EXCISION OF RIGHT EYELID SKIN TAG;  Surgeon: [name], MD;  Location: [place];  Service: General;  Laterality: Right;   EYE SURGERY Bilateral    cataract    GASTRIC BYPASS  1977    reversed in 1979, Early CATH AND CORONARY ANGIOGRAPHY N/A 02/17/2020   Procedure: LEFT HEART CATH AND CORONARY ANGIOGRAPHY;  Surgeon: Leonie Man, MD;  Location: [place] [facility];  Service: Cardiovascular;  Laterality: N/A;   LEFT HEART CATHETERIZATION WITH CORONARY ANGIOGRAM N/A 06/29/2014   Procedure: LEFT HEART CATHETERIZATION WITH CORONARY ANGIOGRAM;  Surgeon: Troy Sine, MD;  Location: [facility] C[facility];  Service: Cardiovascular;  Laterality: N/A;   MEMBRANE PEEL Right 10/23/2018   Procedure: MEMBRANE PEEL;  Surgeon: Hurman Horn, MD;  Location: [place];  Service: Ophthalmology;  Laterality: Right;   MI with stent placement  2004   PARS PLANA VITRECTOMY Right 10/23/2018   Procedure: PARS PLANA VITRECTOMY WITH 25 GAUGE;  Surgeon: Hurman Horn, MD;  Location: [place];  Service: Ophthalmology;  Laterality: Right;    Allergies  Allergies  Allergen Reactions   Sulfonamide Derivatives Swelling and Other (See Comments)    Mouth swelling- no resp issues noted   Lokelma [Sodium Zirconium Cyclosilicate] Other (See Comments)    Headache, stomach upset   Aricept [Donepezil Hcl] Diarrhea and Nausea And Vomiting    GI upset/loose stools   Tramadol Nausea And Vomiting    History of Present Illness    [name]  is a 88year old female with the above mention past medical history who presents today for overdue follow-up of coronary artery disease.  She has an extensive cardiac history dating back to 2004 with most recent left heart cath completed in 02/2020  for NSTEMI with DES to RCA.  During hospitalization patient developed anemia that required blood transfusion.  She is was on Xarelto for history of PE/DVT and shared decision was made between her oncologist and PCP to hold Xarelto given the need for DAPT and history of anemia.  She was seen by Dr. Johney Frame on 08/23/2021 and repeated feeling poorly with headaches and indigestion.  She denied any anginal symptoms or palpitations.  Blood pressures during visit were also noted to be stable.  She reported frequent bowel movements with loose stools up to 7 times per day.  She denied any orthostatic symptoms and blood pressures at home were normal.  She was noted to  have bilateral lower extremity swelling which improved with elevation.  Most recent 2D echo completed 06/2021 showing EF of 55-60%, no RWMA, grade 1 DD, normal RV systolic function, with normal valvular function.  No medication changes were made during visit.  Since last being seen in the office patient reports***.  Patient denies chest pain, palpitations, dyspnea, PND, orthopnea, nausea, vomiting, dizziness, syncope, edema, weight gain, or early satiety.     ***Notes:  Home Medications    Current Outpatient Medications  Medication Sig Dispense Refill   albuterol (PROAIR HFA) 108 (90 Base) MCG/ACT inhaler Inhale 1-2 puffs into the lungs every 6 (six) hours as needed for wheezing or shortness of breath. 6.7 g 1   anastrozole (ARIMIDEX) 1 MG tablet TAKE 1 TABLET BY MOUTH EVERY DAY 28 tablet 1   apixaban (ELIQUIS) 5 MG TABS tablet Take 1 tablet (5 mg total) by mouth 2 (two) times daily. 180 tablet 3   atorvastatin (LIPITOR) 80 MG tablet TAKE 1 TABLET BY MOUTH EVERY DAY (Patient taking differently: Take 80 mg by mouth daily.) 90 tablet 3   carvedilol (COREG) 12.5 MG tablet TAKE 1 TABLET BY MOUTH 2 TIMES DAILY (Patient taking differently: Take 12.5 mg by mouth in the morning and at bedtime.) 180 tablet 1   cetirizine (ZYRTEC) 10 MG tablet Take 10 mg  by mouth daily as needed for allergies or rhinitis.     Continuous Blood Gluc Sensor (FREESTYLE LIBRE 14 DAY SENSOR) MISC PLACE 1 DEVICE ON THE SKIN AS DIRECTED EVERY 14 DAYS 6 each 2   diclofenac Sodium (VOLTAREN) 1 % GEL Apply 2 g topically 4 (four) times daily. (Patient taking differently: Apply 2 g topically 4 (four) times daily as needed (pain).) 150 g 2   DULoxetine (CYMBALTA) 30 MG capsule Take 1 capsule (30 mg total) by mouth daily. 90 capsule 0   Elastic Bandages & Supports (B & B CARPAL TUNNEL BRACE) MISC Use brace on wrist as needed for wrist pain 2 each 10   febuxostat (ULORIC) 40 MG tablet Take 1 tablet (40 mg total) by mouth daily. 90 tablet 3   fluticasone (FLONASE) 50 MCG/ACT nasal spray Place 1 spray into both nostrils in the morning and at bedtime. (Patient taking differently: Place 1 spray into both nostrils 2 (two) times daily as needed for rhinitis or allergies.) 85.4 mL 3   folic acid (FOLVITE) 1 MG tablet TAKE 1 TABLET BY MOUTH EVERY DAY (Patient taking differently: Take 1 mg by mouth daily.) 90 tablet 1   glucagon 1 MG injection Inject 1 mg into the vein once as needed for up to 1 dose. 1 each 12   glucose 4 GM chewable tablet Chew 1 tablet (4 g total) by mouth as needed for low blood sugar. 50 tablet 12   guaifenesin (ROBITUSSIN) 100 MG/5ML syrup Take 200 mg by mouth 3 (three) times daily as needed for cough.     insulin glargine, 2 Unit Dial, (TOUJEO MAX SOLOSTAR) 300 UNIT/ML Solostar Pen Inject 42 Units into the skin daily. Patient assistance program provides (Patient taking differently: Inject 40 Units into the skin daily. Patient assistance program provides. Patient reports 40 units 04/24/2022) 15 mL 6   linaclotide (LINZESS) 145 MCG CAPS capsule Take 1 capsule (145 mcg total) by mouth daily before breakfast. 30 capsule 2   meclizine (ANTIVERT) 25 MG tablet TAKE 1 TABLET BY MOUTH THREE TIMES DAILY AS NEEDED FOR DIZZINESS (Patient taking differently: Take 25 mg by mouth 3  (three) times  daily as needed for nausea or dizziness.) 30 tablet 11   memantine (NAMENDA) 5 MG tablet TAKE 2 TABLETS BY MOUTH EVERY DAY and TAKE 1 TABLET BY MOUTH EVERY EVENING (Patient taking differently: Take 5-10 mg by mouth See admin instructions. TAKE 2 TABLETS BY MOUTH EVERY DAY and TAKE 1 TABLET BY MOUTH EVERY EVENING) 270 tablet 5   montelukast (SINGULAIR) 10 MG tablet Take 1 tablet (10 mg total) by mouth at bedtime. 30 tablet 3   nitroGLYCERIN (NITROSTAT) 0.4 MG SL tablet Take one tablet under the tongue every 5 minutes as needed for chest pain (Patient taking differently: Place 0.4 mg under the tongue every 5 (five) minutes as needed for chest pain.) 50 tablet 0   NOVOLOG FLEXPEN 100 UNIT/ML FlexPen Inject 14 Units into the skin 2 (two) times daily with a meal. Max daily dose 70 units 60 mL 0   nystatin cream (MYCOSTATIN) Apply 1 Application topically 2 (two) times daily. 30 g 3   ondansetron (ZOFRAN) 4 MG tablet Take 1 tablet (4 mg total) by mouth every 8 (eight) hours as needed for nausea or vomiting. 20 tablet 0   [START ON 05/30/2022] oxyCODONE-acetaminophen (PERCOCET) 7.5-325 MG tablet Take 1 tablet by mouth every 12 (twelve) hours. 60 tablet 0   pantoprazole (PROTONIX) 20 MG tablet TAKE 1 TABLET BY MOUTH EVERY DAY 30 tablet 5   sodium polystyrene (SPS) 15 GM/60ML suspension TAKE 60MLS BY MOUTH ONCE WEEKLY TO KEEP POTASSIUM DOWN 240 mL 0   Suvorexant (BELSOMRA) 5 MG TABS Take 5 mg by mouth at bedtime as needed (insomnia). 30 tablet 0   SYMBICORT 160-4.5 MCG/ACT inhaler Inhale 2 puffs by mouth twice daily 11 g 0   torsemide (DEMADEX) 20 MG tablet TAKE 1 TABLET BY MOUTH EVERY DAY - Take an extra dose for weight gain, 3lb in 1 day or 5lbs in 1 week 30 tablet 0   Vitamin D, Ergocalciferol, (DRISDOL) 1.25 MG (50000 UNIT) CAPS capsule TAKE 1 CAPSULE BY MOUTH every 7 days 4 capsule 1   No current facility-administered medications for this visit.     Review of Systems  Please see the  history of present illness.    (+)*** (+)***  All other systems reviewed and are otherwise negative except as noted above.  Physical Exam    Wt Readings from Last 3 Encounters:  05/05/22 287 lb 6.4 oz (130.4 kg)  04/05/22 290 lb (131.5 kg)  03/29/22 290 lb 2 oz (131.6 kg)   NA:TFTDD were no vitals filed for this visit.,There is no height or weight on file to calculate BMI.  Constitutional:      Appearance: Healthy appearance. Not in distress.  Neck:     Vascular: JVD normal.  Pulmonary:     Effort: Pulmonary effort is normal.     Breath sounds: No wheezing. No rales. Diminished in the bases Cardiovascular:     Normal rate. Regular rhythm. Normal S1. Normal S2.      Murmurs: There is no murmur.  Edema:    Peripheral edema absent.  Abdominal:     Palpations: Abdomen is soft non tender. There is no hepatomegaly.  Skin:    General: Skin is warm and dry.  Neurological:     General: No focal deficit present.     Mental Status: Alert and oriented to person, place and time.     Cranial Nerves: Cranial nerves are intact.  EKG/LABS/Other Studies Reviewed    ECG personally reviewed by me today - ***  Risk Assessment/Calculations:   {Does this patient have ATRIAL FIBRILLATION?[phone]}        Lab Results  Component Value Date   WBC 14.4 (H) 05/05/2022   HGB 8.5 (L) 05/05/2022   HCT 26.5 (L) 05/05/2022   MCV 91.4 05/05/2022   PLT 266 05/05/2022   Lab Results  Component Value Date   CREATININE 1.53 (H) 05/05/2022   BUN 46 (H) 05/05/2022   NA 137 05/05/2022   K 4.5 05/05/2022   CL 100 05/05/2022   CO2 26 05/05/2022   Lab Results  Component Value Date   ALT 10 03/27/2022   AST 14 (L) 03/27/2022   ALKPHOS 81 03/27/2022   BILITOT 0.7 03/27/2022   Lab Results  Component Value Date   CHOL 97 01/26/2021   HDL 36 (L) 01/26/2021   LDLCALC 42 01/26/2021   TRIG 109 01/26/2021   CHOLHDL 2.7 01/26/2021    Lab Results  Component Value Date   HGBA1C 7.1 (H)  02/12/2022    Assessment & Plan    1.  Coronary artery disease:  2.  HFpEF:  3.  Essential hypertension:  4.  History of PE/DVT:  5.  Hyperlipidemia:  6.  DM type II:      Disposition: Follow-up with None or APP in *** months {Are you ordering a CV Procedure (e.g. stress test, cath, DCCV, TEE, etc)?   Press F2        :[id number]}   Medication Adjustments/Labs and Tests Ordered: Current medicines are reviewed at length with the patient today.  Concerns regarding medicines are outlined above.   Signed, Mable Fill, Marissa Nestle, NP 05/14/2022, 10:53 PM [facility]  Note:  This document was prepared using Dragon voice recognition software and may include unintentional dictation errors.

## 2022-05-15 ENCOUNTER — Other Ambulatory Visit: Payer: Self-pay

## 2022-05-15 ENCOUNTER — Encounter: Payer: Self-pay | Admitting: Nurse Practitioner

## 2022-05-15 ENCOUNTER — Other Ambulatory Visit: Payer: Self-pay | Admitting: Family Medicine

## 2022-05-15 ENCOUNTER — Ambulatory Visit: Payer: PPO | Attending: Nurse Practitioner | Admitting: Nurse Practitioner

## 2022-05-15 VITALS — BP 114/57 | HR 68 | Ht 67.0 in | Wt 290.2 lb

## 2022-05-15 DIAGNOSIS — I5032 Chronic diastolic (congestive) heart failure: Secondary | ICD-10-CM

## 2022-05-15 DIAGNOSIS — I10 Essential (primary) hypertension: Secondary | ICD-10-CM

## 2022-05-15 DIAGNOSIS — Z86711 Personal history of pulmonary embolism: Secondary | ICD-10-CM

## 2022-05-15 DIAGNOSIS — G4700 Insomnia, unspecified: Secondary | ICD-10-CM

## 2022-05-15 DIAGNOSIS — E119 Type 2 diabetes mellitus without complications: Secondary | ICD-10-CM

## 2022-05-15 DIAGNOSIS — I25118 Atherosclerotic heart disease of native coronary artery with other forms of angina pectoris: Secondary | ICD-10-CM

## 2022-05-15 NOTE — Patient Instructions (Signed)
Medication Instructions:  Your physician recommends that you continue on your current medications as directed. Please refer to the Current Medication list given to you today. *If you need a refill on your cardiac medications before your next appointment, please call your pharmacy*  Lab Work: None Ordered  Testing/Procedures: None Ordered  Follow-Up: At [facility], you and your health needs are our priority.  As part of our continuing mission to provide you with exceptional heart care, we have created designated Provider Care Teams.  These Care Teams include your primary Cardiologist (physician) and Advanced Practice Providers (APPs -  Physician Assistants and Nurse Practitioners) who all work together to provide you with the care you need, when you need it.  We recommend signing up for the patient portal called "MyChart".  Sign up information is provided on this After Visit Summary.  MyChart is used to connect with patients for Virtual Visits (Telemedicine).  Patients are able to view lab/test results, encounter notes, upcoming appointments, etc.  Non-urgent messages can be sent to your provider as well.   To learn more about what you can do with MyChart, go to [email or web address].    Your next appointment:   3 month(s)  The format for your next appointment:   In Person  Provider:   [name], NP        Other Instructions   Important Information About Sugar

## 2022-05-16 ENCOUNTER — Other Ambulatory Visit: Payer: Self-pay | Admitting: Nurse Practitioner

## 2022-05-16 ENCOUNTER — Telehealth: Payer: Self-pay | Admitting: Cardiology

## 2022-05-16 DIAGNOSIS — M25512 Pain in left shoulder: Secondary | ICD-10-CM | POA: Diagnosis not present

## 2022-05-16 DIAGNOSIS — Z1231 Encounter for screening mammogram for malignant neoplasm of breast: Secondary | ICD-10-CM

## 2022-05-16 DIAGNOSIS — G5602 Carpal tunnel syndrome, left upper limb: Secondary | ICD-10-CM | POA: Diagnosis not present

## 2022-05-16 NOTE — Telephone Encounter (Signed)
Called the Pharmacy back about plavix question and if pt should still be taking.   Informed the Pharmacist that the pt is no longer on Plavix and ASA and this was dropped and now she is taking eliquis 5 mg BID instead.  Pharmacist at [facility] verbalized understanding and will fill the pts eliquis accordingly.  Dianne Pharmacist was gracious for all the assistance provided.

## 2022-05-16 NOTE — Telephone Encounter (Signed)
Pt c/o medication issue:  1. Name of Medication: clopidogrel (PLAVIX) tablet 75 mg   2. How are you currently taking this medication (dosage and times per day)? N/A  3. Are you having a reaction (difficulty breathing--STAT)? N/A  4. What is your medication issue? Wanting to confirm if patient should still be on this medication. Please advise.

## 2022-05-17 ENCOUNTER — Telehealth: Payer: Self-pay

## 2022-05-17 DIAGNOSIS — R531 Weakness: Secondary | ICD-10-CM

## 2022-05-17 DIAGNOSIS — I5032 Chronic diastolic (congestive) heart failure: Secondary | ICD-10-CM

## 2022-05-17 NOTE — Telephone Encounter (Signed)
Requesting bariatric light weight transport wheelchair with leg rest to be faxed [phone]) to [facility] at [facility]. [place], [place] [address] at . Her current wheelchair is too heavy for daughter to maneuver. Please advise.

## 2022-05-18 ENCOUNTER — Ambulatory Visit (INDEPENDENT_AMBULATORY_CARE_PROVIDER_SITE_OTHER): Payer: PPO | Admitting: *Deleted

## 2022-05-18 DIAGNOSIS — I10 Essential (primary) hypertension: Secondary | ICD-10-CM

## 2022-05-18 DIAGNOSIS — E119 Type 2 diabetes mellitus without complications: Secondary | ICD-10-CM

## 2022-05-18 NOTE — Addendum Note (Signed)
Addended by: Josephine Igo B on: 05/18/2022 04:44 PM   Modules accepted: Orders

## 2022-05-18 NOTE — Telephone Encounter (Signed)
Wheel chair order faxed successfully to [employer].

## 2022-05-18 NOTE — Chronic Care Management (AMB) (Signed)
Chronic Care Management   CCM RN Visit Note  05/18/2022 Name: [name] MRN: [id number] DOB: 07-Nov-1934  Subjective: [name] is a 88 y.o. year old female who is a primary care patient of Nichole Hollow, MD. The patient was referred to the Chronic Care Management team for assistance with care management needs subsequent to provider initiation of CCM services and plan of care.    Today's Visit:  Engaged with patient by telephone for follow up visit.        Goals Addressed             This Visit's Progress    CCM (DIABETES) EXPECTED OUTCOME:  MONITOR, SELF-MANAGE AND REDUCE SYMPTOMS OF DIABETES       Current Barriers:  Knowledge Deficits related to Diabetes management Chronic Disease Management support and education needs related to Diabetes and diet Cognitive Deficits Per patient's daughter [name], CBG is monitored with Freestyle Libre, states fasting ranges have been in low 100's with no further hypoglycemic episodes, due to Toujeo being decreased from 42 units down to 40, pt has glucose tablets on hand.  Planned Interventions: Reviewed medications with patient and discussed importance of medication adherence;        Reviewed prescribed diet with patient carbohydrate modified; Counseled on importance of regular laboratory monitoring as prescribed;        Discussed plans with patient for ongoing care management follow up and provided patient with direct contact information for care management team;      call provider for findings outside established parameters;       Review of patient status, including review of consultants reports, relevant laboratory and other test results, and medications completed;       Advised patient to discuss hypoglycemia with provider;      Reviewed importance of eating 3 meals per day  Symptom Management: Take medications as prescribed   Attend all scheduled provider appointments Call pharmacy for medication refills 3-7 days in  advance of running out of medications Attend church or other social activities Perform all self care activities independently  Perform IADL's (shopping, preparing meals, housekeeping, managing finances) independently Call provider office for new concerns or questions  check blood sugar at prescribed times: Freestyle LIbre  check feet daily for cuts, sores or redness enter blood sugar readings and medication or insulin into daily log take the blood sugar log to all doctor visits take the blood sugar meter to all doctor visits trim toenails straight across fill half of plate with vegetables limit fast food meals to no more than 1 per week manage portion size prepare main meal at home 3 to 5 days each week read food labels for fat, fiber, carbohydrates and portion size keep feet up while sitting wash and dry feet carefully every day Try eating a snack at hs with protein and carbohydrate Please stay in contact with your doctor if you have any further hypoglycemia (low blood sugar) Follow RULE OF 15 for low blood sugar management:  How to treat low blood sugars (Blood sugar less than 70 mg/dl  Please follow the RULE OF 15 for the treatment of hypoglycemia treatment (When your blood sugars are less than 70 mg/ dl) STEP  1:  Take 15 grams of carbohydrates when your blood sugar is low, which includes:   3-4 glucose tabs or  3-4 oz of juice or regular soda or  One tube of glucose gel STEP 2:  Recheck blood sugar in 15 minutes  STEP 3:  If your blood sugar is still low at the 15 minute recheck ---then, go back to STEP 1 and treat again with another 15 grams of carbohydrates  Follow Up Plan: Telephone follow up appointment with care management team member scheduled for:  07/12/22 at 63 am       CCM (HYPERTENSION) EXPECTED OUTCOME: MONITOR, SELF-MANAGE AND REDUCE SYMPTOMS OF HYPERTENSION       Current Barriers:  Knowledge Deficits related to Hypertension management Chronic Disease Management  support and education needs related to Hypertension Cognitive Deficits-dementia Spoke with patient's daughter [name] who lives with patient, reports patient has "mild dementia", states pt needs assistance with all ADL's, IADL's, pt has WC, walker, shower seat and stays in bed a lot, has home health PT seeing pt for strengthening, states therapist checks blood pressure during visit and daughter does not check at other times.  [name] is primary caregiver,  pt has another daughter that provides meals, pt refuses PACE program.  [name] denies any needs for social work at present, states patient is overall doing well  Planned Interventions: Evaluation of current treatment plan related to hypertension self management and patient's adherence to plan as established by provider;   Reviewed prescribed diet low sodium Reviewed medications with patient and discussed importance of compliance;  Advised patient, providing education and rationale, to monitor blood pressure daily and record, calling PCP for findings outside established parameters;  Advised patient to discuss any issues with blood pressure or medications with provider; Provided education on prescribed diet low sodium;  Discussed complications of poorly controlled blood pressure such as heart disease, stroke, circulatory complications, vision complications, kidney impairment, sexual dysfunction;   Symptom Management: Take medications as prescribed   Attend all scheduled provider appointments Call pharmacy for medication refills 3-7 days in advance of running out of medications Attend church or other social activities Perform all self care activities independently  Perform IADL's (shopping, preparing meals, housekeeping, managing finances) independently Call provider office for new concerns or questions  check blood pressure weekly choose a place to take my blood pressure (home, clinic or office, retail store) write blood pressure results in a log or  diary keep a blood pressure log take blood pressure log to all doctor appointments call doctor for signs and symptoms of high blood pressure keep all doctor appointments take medications for blood pressure exactly as prescribed report new symptoms to your doctor eat more whole grains, fruits and vegetables, lean meats and healthy fats Follow low sodium diet- read labels, limit/ avoid fast food Continue completing exercises prescribed by physical therapy  Follow Up Plan: Telephone follow up appointment with care management team member scheduled for:  07/12/2022  at 1045 am          Plan:Telephone follow up appointment with care management team member scheduled for:  07/12/22 at [place] am  Jacqlyn Larsen [facility], BSN RN Case Chief Operating Officer at [employer] [phone]

## 2022-05-18 NOTE — Patient Instructions (Addendum)
Please call the care guide team at [phone] if you need to cancel or reschedule your appointment.   If you are experiencing a Mental Health or [place] or need someone to talk to, please call the Suicide and Crisis Lifeline: 988 call the [place] National Suicide Prevention Lifeline: (786)159-0691 or TTY: 334-140-2468 TTY 772-269-5289) to talk to a trained counselor call 1-800-273-TALK (toll free, 24 hour hotline) go to [facility] [address], [place] [phone]) call 911   Following is a copy of the CCM Program Consent:  CCM service includes personalized support from designated clinical staff supervised by the physician, including individualized plan of care and coordination with other care providers 24/7 contact phone numbers for assistance for urgent and routine care needs. Service will only be billed when office clinical staff spend 20 minutes or more in a month to coordinate care. Only one practitioner may furnish and bill the service in a calendar month. The patient may stop CCM services at amy time (effective at the end of the month) by phone call to the office staff. The patient will be responsible for cost sharing (co-pay) or up to 20% of the service fee (after annual deductible is met)  Following is a copy of your full provider care plan:   Goals Addressed             This Visit's Progress    CCM (DIABETES) EXPECTED OUTCOME:  MONITOR, SELF-MANAGE AND REDUCE SYMPTOMS OF DIABETES       Current Barriers:  Knowledge Deficits related to Diabetes management Chronic Disease Management support and education needs related to Diabetes and diet Cognitive Deficits Per patient's daughter Liz Malady, CBG is monitored with Freestyle Libre, states fasting ranges have been in low 100's with no further hypoglycemic episodes, due to Toujeo being decreased from 42 units down to 40, pt has glucose tablets on hand.  Planned  Interventions: Reviewed medications with patient and discussed importance of medication adherence;        Reviewed prescribed diet with patient carbohydrate modified; Counseled on importance of regular laboratory monitoring as prescribed;        Discussed plans with patient for ongoing care management follow up and provided patient with direct contact information for care management team;      call provider for findings outside established parameters;       Review of patient status, including review of consultants reports, relevant laboratory and other test results, and medications completed;       Advised patient to discuss hypoglycemia with provider;      Reviewed importance of eating 3 meals per day  Symptom Management: Take medications as prescribed   Attend all scheduled provider appointments Call pharmacy for medication refills 3-7 days in advance of running out of medications Attend church or other social activities Perform all self care activities independently  Perform IADL's (shopping, preparing meals, housekeeping, managing finances) independently Call provider office for new concerns or questions  check blood sugar at prescribed times: Freestyle LIbre  check feet daily for cuts, sores or redness enter blood sugar readings and medication or insulin into daily log take the blood sugar log to all doctor visits take the blood sugar meter to all doctor visits trim toenails straight across fill half of plate with vegetables limit fast food meals to no more than 1 per week manage portion size prepare main meal at home 3 to 5 days each week read food labels for fat, fiber, carbohydrates  and portion size keep feet up while sitting wash and dry feet carefully every day Try eating a snack at hs with protein and carbohydrate Please stay in contact with your doctor if you have any further hypoglycemia (low blood sugar) Follow RULE OF 15 for low blood sugar management:  How to treat  low blood sugars (Blood sugar less than 70 mg/dl  Please follow the RULE OF 15 for the treatment of hypoglycemia treatment (When your blood sugars are less than 70 mg/ dl) STEP  1:  Take 15 grams of carbohydrates when your blood sugar is low, which includes:   3-4 glucose tabs or  3-4 oz of juice or regular soda or  One tube of glucose gel STEP 2:  Recheck blood sugar in 15 minutes STEP 3:  If your blood sugar is still low at the 15 minute recheck ---then, go back to STEP 1 and treat again with another 15 grams of carbohydrates  Follow Up Plan: Telephone follow up appointment with care management team member scheduled for:  07/12/22 at 1045 am       CCM (HYPERTENSION) EXPECTED OUTCOME: MONITOR, SELF-MANAGE AND REDUCE SYMPTOMS OF HYPERTENSION       Current Barriers:  Knowledge Deficits related to Hypertension management Chronic Disease Management support and education needs related to Hypertension Cognitive Deficits-dementia Spoke with patient's daughter Liz Malady who lives with patient, reports patient has "mild dementia", states pt needs assistance with all ADL's, IADL's, pt has WC, walker, shower seat and stays in bed a lot, has home health PT seeing pt for strengthening, states therapist checks blood pressure during visit and daughter does not check at other times.  [name] is primary caregiver,  pt has another daughter that provides meals, pt refuses PACE program.  [name] denies any needs for social work at present, states patient is overall doing well  Planned Interventions: Evaluation of current treatment plan related to hypertension self management and patient's adherence to plan as established by provider;   Reviewed prescribed diet low sodium Reviewed medications with patient and discussed importance of compliance;  Advised patient, providing education and rationale, to monitor blood pressure daily and record, calling PCP for findings outside established parameters;  Advised patient to discuss  any issues with blood pressure or medications with provider; Provided education on prescribed diet low sodium;  Discussed complications of poorly controlled blood pressure such as heart disease, stroke, circulatory complications, vision complications, kidney impairment, sexual dysfunction;   Symptom Management: Take medications as prescribed   Attend all scheduled provider appointments Call pharmacy for medication refills 3-7 days in advance of running out of medications Attend church or other social activities Perform all self care activities independently  Perform IADL's (shopping, preparing meals, housekeeping, managing finances) independently Call provider office for new concerns or questions  check blood pressure weekly choose a place to take my blood pressure (home, clinic or office, retail store) write blood pressure results in a log or diary keep a blood pressure log take blood pressure log to all doctor appointments call doctor for signs and symptoms of high blood pressure keep all doctor appointments take medications for blood pressure exactly as prescribed report new symptoms to your doctor eat more whole grains, fruits and vegetables, lean meats and healthy fats Follow low sodium diet- read labels, limit/ avoid fast food Continue completing exercises prescribed by physical therapy  Follow Up Plan: Telephone follow up appointment with care management team member scheduled for:  07/12/2022  at 1045 am  Patient verbalizes understanding of instructions and care plan provided today and agrees to view in [place]. Active MyChart status and patient understanding of how to access instructions and care plan via MyChart confirmed with patient.     Telephone follow up appointment with care management team member scheduled for:  07/12/22 at 1045 am

## 2022-05-19 NOTE — Addendum Note (Signed)
Addended by: Vance Peper A on: 05/19/2022 02:07 PM   Modules accepted: Orders

## 2022-05-19 NOTE — Telephone Encounter (Signed)
I called patient's daughter, [name], to schedule AWV for patient.  She said the rx was suppose to be for a Rolator not a wheelchair. [facility] doesn't have a wheelchair, so [name] needs a rx for a bariatric rolator sent to [facility] because they have the bariatric rolator. [name]'s on her way there now because she needs it for the weekend. [name] will call back to let you know where to send the rx for the wheelchair,  when she finds out where she can get the wheelchair.

## 2022-05-19 NOTE — Telephone Encounter (Signed)
Spoke with [name] and she advised me that [facility] hadn't received the fax for the wheelchair and that she would call back in on Tuesday to verify. I advised her that I faxed it on yesterday, but I can resend it again.

## 2022-05-22 ENCOUNTER — Other Ambulatory Visit: Payer: Self-pay | Admitting: Family Medicine

## 2022-05-22 ENCOUNTER — Other Ambulatory Visit: Payer: Self-pay | Admitting: Hematology

## 2022-05-22 DIAGNOSIS — K219 Gastro-esophageal reflux disease without esophagitis: Secondary | ICD-10-CM

## 2022-05-22 DIAGNOSIS — E1142 Type 2 diabetes mellitus with diabetic polyneuropathy: Secondary | ICD-10-CM

## 2022-05-22 NOTE — Telephone Encounter (Signed)
Chart supports Rx Last OV: 04/2022 Next OV: 05/2022

## 2022-05-24 DIAGNOSIS — G609 Hereditary and idiopathic neuropathy, unspecified: Secondary | ICD-10-CM | POA: Diagnosis not present

## 2022-05-24 DIAGNOSIS — G5622 Lesion of ulnar nerve, left upper limb: Secondary | ICD-10-CM | POA: Diagnosis not present

## 2022-05-24 DIAGNOSIS — G5602 Carpal tunnel syndrome, left upper limb: Secondary | ICD-10-CM | POA: Diagnosis not present

## 2022-05-29 ENCOUNTER — Telehealth: Payer: Self-pay | Admitting: Family Medicine

## 2022-05-29 DIAGNOSIS — Z789 Other specified health status: Secondary | ICD-10-CM | POA: Insufficient documentation

## 2022-05-29 NOTE — Addendum Note (Signed)
Addended by: Bonnita Hollow on: 05/29/2022 02:39 PM   Modules accepted: Orders

## 2022-05-29 NOTE — Assessment & Plan Note (Addendum)
Patient has extensive limited mobility predominantly has to be in a wheelchair from CHF, diabetes with extensive peripheral neuropathy, chronic vertigo, COPD, morbid obesity, etc.  Patient's limited mobility impairs her ability to do mobility related ADLs including dressing, safe transfer to bed or chair, toileting, food preparation.  A cane, walker, crutches will not resolve the issue.  Patient has caregiver that can propel the wheelchair.

## 2022-05-29 NOTE — Telephone Encounter (Signed)
Caller Name: [facility] fax # [phone] Call back phone #: [phone]  Reason for Call: they need more information for the insurance to cover the wheelchair.

## 2022-05-29 NOTE — Telephone Encounter (Signed)
Contacted [facility] and was sent to the call center. I spoke with Aireal who sent out a communication request for an agent to return my call to advise what else is required.

## 2022-05-30 ENCOUNTER — Telehealth: Payer: Self-pay | Admitting: Family Medicine

## 2022-05-30 NOTE — Telephone Encounter (Signed)
Type of forms received: written note  Routed to: [employer] received by : terrill   Individual made aware of 3-5 business day turn around (Y/N): yes Form completed and patient made aware of charges(Y/N):yes   Faxed to :   Form location:  dr [place] box

## 2022-05-31 ENCOUNTER — Other Ambulatory Visit: Payer: Self-pay | Admitting: Internal Medicine

## 2022-05-31 DIAGNOSIS — G5602 Carpal tunnel syndrome, left upper limb: Secondary | ICD-10-CM | POA: Diagnosis not present

## 2022-05-31 DIAGNOSIS — M25512 Pain in left shoulder: Secondary | ICD-10-CM | POA: Diagnosis not present

## 2022-06-01 NOTE — Telephone Encounter (Signed)
FYI

## 2022-06-03 ENCOUNTER — Other Ambulatory Visit: Payer: Self-pay | Admitting: Family Medicine

## 2022-06-03 DIAGNOSIS — E875 Hyperkalemia: Secondary | ICD-10-CM

## 2022-06-04 ENCOUNTER — Emergency Department (HOSPITAL_BASED_OUTPATIENT_CLINIC_OR_DEPARTMENT_OTHER): Payer: PPO

## 2022-06-04 ENCOUNTER — Other Ambulatory Visit: Payer: Self-pay

## 2022-06-04 ENCOUNTER — Encounter (HOSPITAL_BASED_OUTPATIENT_CLINIC_OR_DEPARTMENT_OTHER): Payer: Self-pay | Admitting: Emergency Medicine

## 2022-06-04 ENCOUNTER — Emergency Department (HOSPITAL_BASED_OUTPATIENT_CLINIC_OR_DEPARTMENT_OTHER)
Admission: EM | Admit: 2022-06-04 | Discharge: 2022-06-04 | Disposition: A | Discharge status: Home / Self Care | Payer: PPO | Attending: Emergency Medicine | Admitting: Emergency Medicine

## 2022-06-04 DIAGNOSIS — M25552 Pain in left hip: Secondary | ICD-10-CM | POA: Diagnosis not present

## 2022-06-04 DIAGNOSIS — M545 Low back pain, unspecified: Secondary | ICD-10-CM | POA: Diagnosis not present

## 2022-06-04 DIAGNOSIS — Z794 Long term (current) use of insulin: Secondary | ICD-10-CM | POA: Insufficient documentation

## 2022-06-04 DIAGNOSIS — M47816 Spondylosis without myelopathy or radiculopathy, lumbar region: Secondary | ICD-10-CM | POA: Diagnosis not present

## 2022-06-04 DIAGNOSIS — G894 Chronic pain syndrome: Secondary | ICD-10-CM

## 2022-06-04 MED ORDER — OXYCODONE-ACETAMINOPHEN 7.5-300 MG PO TABS: 1.0000 | ORAL_TABLET | ORAL | 0 refills | Status: DC | PRN

## 2022-06-04 MED ORDER — OXYCODONE-ACETAMINOPHEN 5-325 MG PO TABS
2.0000 | ORAL_TABLET | Freq: Once | ORAL | Status: AC
  Administered 2022-06-04: 2 via ORAL
  Filled 2022-06-04: qty 2

## 2022-06-04 NOTE — ED Provider Notes (Signed)
[place] [facility] Provider Note   CSN: [id number] Arrival date & time: 06/04/22  1428     History  Chief Complaint  Patient presents with   Hip Pain    [name] is a 88 y.o. female, history of chronic pain, who presents to the ED secondary to left hip pain that is been persistent over the last week.  She states that she was getting out of bed, lifting her left leg up, and she felt a pop, and had sudden pain in her left hip, has been able to move it without having searing pain, she states that she takes Percocet, for knee pain, which provides her a little bit of relief, but that it shortly goes away.  Denies any numbness, tingling of her leg, no bruising, rash per patient.  Did not fall with this.  Of note patient is primarily wheelchair-bound, she is able to ambulate from her bed to her wheelchair, but otherwise does not get up.  Denies any recent falls.    Home Medications Prior to Admission medications   Medication Sig Start Date End Date Taking? Authorizing Provider  oxycodone-acetaminophen (LYNOX) 7.5-300 MG tablet Take 1 tablet by mouth every 4 (four) hours as needed for pain (this medication is to augment your current pain meds, only take Percocet every four hours as needed (this includes your current prescription, combined with the script)). 06/04/22  Yes Aubery Douthat L, PA  albuterol (PROAIR HFA) 108 (90 Base) MCG/ACT inhaler Inhale 1-2 puffs into the lungs every 6 (six) hours as needed for wheezing or shortness of breath. 12/19/18   [name], NP  anastrozole (ARIMIDEX) 1 MG tablet TAKE 1 TABLET BY MOUTH EVERY DAY 04/20/22   [name], MD  apixaban (ELIQUIS) 5 MG TABS tablet Take 1 tablet (5 mg total) by mouth 2 (two) times daily. 04/05/22 03/31/23  Bonnita Hollow, MD  atorvastatin (LIPITOR) 80 MG tablet TAKE 1 TABLET BY MOUTH EVERY DAY 01/11/22   Bonnita Hollow, MD  BELSOMRA 5 MG TABS TAKE 1 TABLET BY MOUTH AT  BEDTIME AS NEEDED FOR INSOMNIA 05/16/22   Bonnita Hollow, MD  carvedilol (COREG) 12.5 MG tablet TAKE 1 TABLET BY MOUTH 2 TIMES DAILY 01/11/22   Bonnita Hollow, MD  cetirizine (ZYRTEC) 10 MG tablet Take 10 mg by mouth daily as needed for allergies or rhinitis.    [provider]  Continuous Blood Gluc Sensor (FREESTYLE LIBRE 14 DAY SENSOR) MISC PLACE 1 DEVICE ON THE SKIN AS DIRECTED EVERY 14 DAYS 02/27/22   Shamleffer, [name], MD  diclofenac Sodium (VOLTAREN) 1 % GEL Apply 2 g topically 4 (four) times daily. Patient taking differently: Apply 2 g topically 4 (four) times daily as needed (pain). 02/23/22   Bonnita Hollow, MD  DULoxetine (CYMBALTA) 30 MG capsule TAKE 1 CAPSULE BY MOUTH EVERY DAY 05/22/22   Bonnita Hollow, MD  Elastic Bandages & Supports (B & B CARPAL TUNNEL BRACE) MISC Use brace on wrist as needed for wrist pain 04/24/22   Bonnita Hollow, MD  febuxostat (ULORIC) 40 MG tablet Take 1 tablet (40 mg total) by mouth daily. 04/04/22 03/30/23  Bonnita Hollow, MD  fluticasone (FLONASE) 50 MCG/ACT nasal spray Place 1 spray into both nostrils in the morning and at bedtime. Patient taking differently: Place 1 spray into both nostrils 2 (two) times daily as needed for rhinitis or allergies. 12/17/20   Ngetich, Nelda Bucks, NP  folic acid (FOLVITE) 1 MG tablet TAKE 1 TABLET BY MOUTH EVERY DAY 01/11/22   Bonnita Hollow, MD  glucagon 1 MG injection Inject 1 mg into the vein once as needed for up to 1 dose. 04/13/22   Bonnita Hollow, MD  glucose 4 GM chewable tablet Chew 1 tablet (4 g total) by mouth as needed for low blood sugar. 04/05/22   Bonnita Hollow, MD  guaifenesin (ROBITUSSIN) 100 MG/5ML syrup Take 200 mg by mouth 3 (three) times daily as needed for cough.    [provider]  insulin glargine, 2 Unit Dial, (TOUJEO MAX SOLOSTAR) 300 UNIT/ML Solostar Pen Inject 40 Units into the skin daily. Patient assistance program provides. Patient reports 40 units  04/24/2022 05/31/22   Shamleffer, [name], MD  linaclotide [facility]) 145 MCG CAPS capsule Take 1 capsule (145 mcg total) by mouth daily before breakfast. 09/05/21   [name], NP  meclizine (ANTIVERT) 25 MG tablet TAKE 1 TABLET BY MOUTH THREE TIMES DAILY AS NEEDED FOR DIZZINESS 10/11/21   [name], NP  memantine (NAMENDA) 5 MG tablet TAKE 2 TABLETS BY MOUTH EVERY DAY and TAKE 1 TABLET BY MOUTH EVERY EVENING Patient taking differently: Take 5-10 mg by mouth See admin instructions. TAKE 2 TABLETS BY MOUTH EVERY DAY and TAKE 1 TABLET BY MOUTH EVERY EVENING 09/26/21   [name], NP  montelukast (SINGULAIR) 10 MG tablet Take 1 tablet (10 mg total) by mouth at bedtime. 04/24/22   Bonnita Hollow, MD  nitroGLYCERIN (NITROSTAT) 0.4 MG SL tablet Take one tablet under the tongue every 5 minutes as needed for chest pain 09/24/13   [name], NP  NOVOLOG FLEXPEN 100 UNIT/ML FlexPen Inject 14 Units into the skin 2 (two) times daily with a meal. Max daily dose 70 units 04/03/22   Shamleffer, [name], MD  nystatin cream (MYCOSTATIN) Apply 1 Application topically 2 (two) times daily. 02/16/22   Reed, Tiffany L, DO  ondansetron (ZOFRAN) 4 MG tablet Take 1 tablet (4 mg total) by mouth every 8 (eight) hours as needed for nausea or vomiting. 03/14/22   Bonnita Hollow, MD  oxyCODONE-acetaminophen (PERCOCET) 7.5-325 MG tablet Take 1 tablet by mouth every 12 (twelve) hours. 05/30/22 06/29/22  Gillis Santa, MD  pantoprazole (PROTONIX) 20 MG tablet TAKE 1 TABLET BY MOUTH EVERY DAY 05/22/22   Bonnita Hollow, MD  sodium polystyrene (SPS) 15 GM/60ML suspension TAKE 60MLS BY MOUTH ONCE WEEKLY TO KEEP POTASSIUM DOWN 04/24/22   Bonnita Hollow, MD  SYMBICORT 160-4.5 MCG/ACT inhaler Inhale 2 puffs by mouth twice daily 05/12/22   [name], NP  torsemide (DEMADEX) 20 MG tablet TAKE 1 TABLET BY MOUTH EVERY DAY Take an extra dose for weight gain, 3lb in 1 day or 5lbs in 1 week  05/22/22   Bonnita Hollow, MD  Vitamin D, Ergocalciferol, (DRISDOL) 1.25 MG (50000 UNIT) CAPS capsule TAKE 1 CAPSULE BY MOUTH every 7 days 05/22/22   [name], MD      Allergies    Sulfonamide derivatives, Lokelma [sodium zirconium cyclosilicate], Aricept [donepezil hcl], and Tramadol    Review of Systems   Review of Systems  Genitourinary:  Negative for difficulty urinating.  Musculoskeletal:        +back pain    Physical Exam Updated Vital Signs BP (!) 152/66 (BP Location: Right Arm)   Pulse 68   Temp 98.4 F (36.9 C) (Oral)   Resp 20  Ht '5\' 7"'$  (1.702 m)   Wt 131.5 kg   SpO2 99%   BMI 45.42 kg/m  Physical Exam Vitals and nursing note reviewed.  Constitutional:      General: She is not in acute distress.    Appearance: She is well-developed.  HENT:     Head: Normocephalic and atraumatic.  Eyes:     Conjunctiva/sclera: Conjunctivae normal.  Cardiovascular:     Rate and Rhythm: Normal rate and regular rhythm.     Heart sounds: No murmur heard. Pulmonary:     Effort: Pulmonary effort is normal. No respiratory distress.     Breath sounds: Normal breath sounds.  Abdominal:     Palpations: Abdomen is soft.     Tenderness: There is no abdominal tenderness.  Musculoskeletal:        General: No swelling.     Cervical back: Neck supple.     Comments: Tenderness to palpation of lumbar spine, ASIS, PSIS, no ecchymosis crepitus of the region.  No buttocks pain, pain worse with range of motion including internal/external rotation of the hip, flexion extension of the leg.  Skin:    General: Skin is warm and dry.     Capillary Refill: Capillary refill takes less than 2 seconds.     Comments: No rash, induration, warmth to pelvic or hip region.  Neurological:     Mental Status: She is alert.  Psychiatric:        Mood and Affect: Mood normal.     ED Results / Procedures / Treatments   Labs (all labs ordered are listed, but only abnormal results are  displayed) Labs Reviewed - No data to display  EKG None  Radiology CT Hip Left Wo Contrast  Result Date: 06/04/2022 CLINICAL DATA:  Left hip pain for 1 week, no known injury, initial encounter EXAM: CT OF THE LEFT HIP WITHOUT CONTRAST TECHNIQUE: Multidetector CT imaging of the left hip was performed according to the standard protocol. Multiplanar CT image reconstructions were also generated. RADIATION DOSE REDUCTION: This exam was performed according to the departmental dose-optimization program which includes automated exposure control, adjustment of the mA and/or kV according to patient size and/or use of iterative reconstruction technique. COMPARISON:  None Available. FINDINGS: Bones/Joint/Cartilage Generalized osteopenia is noted. Left hip is well seated in the acetabulum. No acute fracture is identified. Ligaments Suboptimally assessed by CT. Muscles and Tendons Surrounding musculature appears within normal limits. Soft tissues A Fronia Depass fluid collection is noted along the lateral aspect of the greater trochanter which may be related to recent injury. No definitive hematoma is seen. No other soft tissue abnormality is noted. IMPRESSION: No acute bony abnormality is noted.  Generalized osteopenia is seen. Tori Dattilio fluid collection is noted along the lateral aspect of the greater trochanter which may be related to recent injury. Electronically Signed   By: Inez Catalina M.D.   On: 06/04/2022 18:22   CT Lumbar Spine Wo Contrast  Result Date: 06/04/2022 CLINICAL DATA:  Left hip and leg pain for 1 week. EXAM: CT LUMBAR SPINE WITHOUT CONTRAST TECHNIQUE: Multidetector CT imaging of the lumbar spine was performed without intravenous contrast administration. Multiplanar CT image reconstructions were also generated. RADIATION DOSE REDUCTION: This exam was performed according to the departmental dose-optimization program which includes automated exposure control, adjustment of the mA and/or kV according to patient  size and/or use of iterative reconstruction technique. COMPARISON:  CT of the abdomen and pelvis 03/26/2022 FINDINGS: Segmentation: 5 non rib-bearing lumbar type vertebral bodies are  present. The lowest fully formed vertebral body is L5. Alignment: No significant listhesis is present. Straightening of the normal lumbar lordosis is stable. Vertebrae: Vertebral body heights are maintained. Schmorl's nodes at superior endplate of L4 and at Y8-1 are stable. No acute or healing fractures are present. Visualized sacrum and pelvis demonstrate no acute abnormality. Degenerative changes are noted at the SI joints bilaterally. Paraspinal and other soft tissues: Bilateral renal cysts are again noted. No imaging follow-up recommended. Atherosclerotic changes are present in the aorta without aneurysm. No significant adenopathy is present. Disc levels: T12-L1: Bilateral facet hypertrophy is present. No significant stenosis is present. L1-2: Bilateral facet hypertrophy is present. No significant disc protrusion or stenosis is present. L2-3: Vacuum disc noted. A broad-based disc protrusion and advanced facet hypertrophy contribute to moderate to severe central canal stenosis left greater than right foraminal narrowing may contribute to left hip pain. L3-4: A leftward disc protrusion is present. Moderate facet hypertrophy is noted bilaterally. Mild to moderate left subarticular and foraminal stenosis is present. L4-5: A broad-based disc protrusion is present. Chronic loss of disc height is present. Advanced facet hypertrophy is noted. Moderate central and left foraminal stenosis is present. L5-S1: A rightward disc protrusion is present. Asymmetric right-sided facet hypertrophy is noted. Moderate to severe right and moderate left foraminal stenosis is present. IMPRESSION: 1. No acute abnormality. 2. Multilevel spondylosis of the lumbar spine as described, similar to the prior exams. 3. Left-sided disease is greatest at L2-3 which  could contribute to left hip pain. Electronically Signed   By: San Morelle M.D.   On: 06/04/2022 18:21   DG Hip Unilat W or Wo Pelvis 2-3 Views Left  Result Date: 06/04/2022 CLINICAL DATA:  Hip pain. EXAM: DG HIP (WITH OR WITHOUT PELVIS) 2-3V LEFT COMPARISON:  None Available. FINDINGS: Vascular calcifications. No fracture or dislocation. No cause for acute hip pain identified. IMPRESSION: Vascular calcifications. No cause for acute hip pain identified. Electronically Signed   By: Dorise Bullion III M.D.   On: 06/04/2022 15:18    Procedures Procedures  Medications Ordered in ED Medications  oxyCODONE-acetaminophen (PERCOCET/ROXICET) 5-325 MG per tablet 2 tablet (2 tablets Oral Given 06/04/22 1816)    ED Course/ Medical Decision Making/ A&P                            Medical Decision Making Patient is an 88 year old female, here for persistent left hip pain, that has been bothering her after she felt a pop when she tried to get out of bed the other day.  She denies any falls.  X-ray ordered by triage RN, was unremarkable, given her severity of pain, diffuse tenderness to palpation, and lumbar tenderness we will obtain a CT lumbar and CT pelvis for further evaluation.  Additionally we will give her a Percocet for her pain.  Amount and/or Complexity of Data Reviewed Radiology: ordered.    Details: CT shows multilevel spondylosis, particularly in the lower L2/L3.  Additionally found to have Jinnie Onley fluid collection along the greater trochanter laterally. Discussion of management or test interpretation with external provider(s): Discussed with patient, Egidio Lofgren fluid collection, no findings on exam initially, except for tenderness to palpation, we discussed possible cyst trochanteric bursitis that possibly burst?.  Versus hematoma.  We discussed follow-up with primary care doctor, and return precautions.  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  Pain medication sent to the pharmacy.  We discussed return precautions need for follow-up  Risk Prescription drug management.   Final Clinical Impression(s) / ED Diagnoses Final diagnoses:  Left hip pain    Rx / DC Orders ED Discharge Orders          Ordered    oxycodone-acetaminophen (LYNOX) 7.5-300 MG tablet  Every 4 hours PRN        06/04/22 1848              Osvaldo Shipper, [place] 06/04/22 1857    Tegeler, Gwenyth Allegra, MD 06/04/22 [phone]

## 2022-06-04 NOTE — Discharge Instructions (Addendum)
Please follow-up with your primary care doctor and talk to pain management in regards to your pain medication.  I prescribed a short dose of extra pain medication to help with your pain acutely.  Please follow-up with your pain management, and PCP.  If you develop any loss of pulse, severe pain please return to the ER.  Today there is no acute fractures noted, however there was some fluid in your pelvis around your femur.  I believe this likely secondary to a ruptured cyst, versus hematoma, there is no redness or swelling, evident, thus so I think infection is unlikely.  You develop fever, chills, worsening pain please return to the ER.

## 2022-06-04 NOTE — ED Triage Notes (Signed)
Pt arrives pov, to triage in wheelchair, c/o LT hip pain x 1 week. Pt reports feeling "Pop". Endorses 1g Tylenol 2 hrs pta

## 2022-06-05 ENCOUNTER — Telehealth (HOSPITAL_BASED_OUTPATIENT_CLINIC_OR_DEPARTMENT_OTHER): Payer: Self-pay | Admitting: Emergency Medicine

## 2022-06-05 ENCOUNTER — Ambulatory Visit (INDEPENDENT_AMBULATORY_CARE_PROVIDER_SITE_OTHER): Payer: PPO

## 2022-06-05 VITALS — Ht 67.0 in | Wt 290.0 lb

## 2022-06-05 DIAGNOSIS — Z0000 Encounter for general adult medical examination without abnormal findings: Secondary | ICD-10-CM | POA: Diagnosis not present

## 2022-06-05 NOTE — Patient Instructions (Signed)
[name] , Thank you for taking time to come for your Medicare Wellness Visit. I appreciate your ongoing commitment to your health goals. Please review the following plan we discussed and let me know if I can assist you in the future.   These are the goals we discussed:  Goals      CCM (DIABETES) EXPECTED OUTCOME:  MONITOR, SELF-MANAGE AND REDUCE SYMPTOMS OF DIABETES     Current Barriers:  Knowledge Deficits related to Diabetes management Chronic Disease Management support and education needs related to Diabetes and diet Cognitive Deficits Per patient's daughter [name], CBG is monitored with [facility], states fasting ranges have been in low 100's with no further hypoglycemic episodes, due to Toujeo being decreased from 42 units down to 40, pt has glucose tablets on hand.  Planned Interventions: Reviewed medications with patient and discussed importance of medication adherence;        Reviewed prescribed diet with patient carbohydrate modified; Counseled on importance of regular laboratory monitoring as prescribed;        Discussed plans with patient for ongoing care management follow up and provided patient with direct contact information for care management team;      call provider for findings outside established parameters;       Review of patient status, including review of consultants reports, relevant laboratory and other test results, and medications completed;       Advised patient to discuss hypoglycemia with provider;      Reviewed importance of eating 3 meals per day  Symptom Management: Take medications as prescribed   Attend all scheduled provider appointments Call pharmacy for medication refills 3-7 days in advance of running out of medications Attend church or other social activities Perform all self care activities independently  Perform IADL's (shopping, preparing meals, housekeeping, managing finances) independently Call provider office for new concerns or  questions  check blood sugar at prescribed times: Freestyle LIbre  check feet daily for cuts, sores or redness enter blood sugar readings and medication or insulin into daily log take the blood sugar log to all doctor visits take the blood sugar meter to all doctor visits trim toenails straight across fill half of plate with vegetables limit fast food meals to no more than 1 per week manage portion size prepare main meal at home 3 to 5 days each week read food labels for fat, fiber, carbohydrates and portion size keep feet up while sitting wash and dry feet carefully every day Try eating a snack at hs with protein and carbohydrate Please stay in contact with your doctor if you have any further hypoglycemia (low blood sugar) Follow RULE OF 15 for low blood sugar management:  How to treat low blood sugars (Blood sugar less than 70 mg/dl  Please follow the RULE OF 15 for the treatment of hypoglycemia treatment (When your blood sugars are less than 70 mg/ dl) STEP  1:  Take 15 grams of carbohydrates when your blood sugar is low, which includes:   3-4 glucose tabs or  3-4 oz of juice or regular soda or  One tube of glucose gel STEP 2:  Recheck blood sugar in 15 minutes STEP 3:  If your blood sugar is still low at the 15 minute recheck ---then, go back to STEP 1 and treat again with another 15 grams of carbohydrates  Follow Up Plan: Telephone follow up appointment with care management team member scheduled for:  07/12/22 at 1045 am  CCM (HYPERTENSION) EXPECTED OUTCOME: MONITOR, SELF-MANAGE AND REDUCE SYMPTOMS OF HYPERTENSION     Current Barriers:  Knowledge Deficits related to Hypertension management Chronic Disease Management support and education needs related to Hypertension Cognitive Deficits-dementia Spoke with patient's daughter [name] who lives with patient, reports patient has "mild dementia", states pt needs assistance with all ADL's, IADL's, pt has WC, walker, shower seat  and stays in bed a lot, has home health PT seeing pt for strengthening, states therapist checks blood pressure during visit and daughter does not check at other times.  [name] is primary caregiver,  pt has another daughter that provides meals, pt refuses PACE program.  [name] denies any needs for social work at present, states patient is overall doing well  Planned Interventions: Evaluation of current treatment plan related to hypertension self management and patient's adherence to plan as established by provider;   Reviewed prescribed diet low sodium Reviewed medications with patient and discussed importance of compliance;  Advised patient, providing education and rationale, to monitor blood pressure daily and record, calling PCP for findings outside established parameters;  Advised patient to discuss any issues with blood pressure or medications with provider; Provided education on prescribed diet low sodium;  Discussed complications of poorly controlled blood pressure such as heart disease, stroke, circulatory complications, vision complications, kidney impairment, sexual dysfunction;   Symptom Management: Take medications as prescribed   Attend all scheduled provider appointments Call pharmacy for medication refills 3-7 days in advance of running out of medications Attend church or other social activities Perform all self care activities independently  Perform IADL's (shopping, preparing meals, housekeeping, managing finances) independently Call provider office for new concerns or questions  check blood pressure weekly choose a place to take my blood pressure (home, clinic or office, retail store) write blood pressure results in a log or diary keep a blood pressure log take blood pressure log to all doctor appointments call doctor for signs and symptoms of high blood pressure keep all doctor appointments take medications for blood pressure exactly as prescribed report new symptoms to your  doctor eat more whole grains, fruits and vegetables, lean meats and healthy fats Follow low sodium diet- read labels, limit/ avoid fast food Continue completing exercises prescribed by physical therapy  Follow Up Plan: Telephone follow up appointment with care management team member scheduled for:  07/12/2022  at 1045 am       DIET - EAT [place]     Patient will increase vegetables.     Increase physical activity     Starting 03/24/16,  I will attempt to get out of bed daily for at least 30 min at a time.      Patient Stated     06/05/2022, wants to get better     Weight (lb) < 250 lb (113.4 kg)     Would like to lose weight to 250 lb by eating better         This is a list of the screening recommended for you and due dates:  Health Maintenance  Topic Date Due   Yearly kidney health urinalysis for diabetes  06/14/2018   Eye exam for diabetics  08/26/2020   COVID-19 Vaccine (4 - 2023-24 season) 01/06/2022   Complete foot exam   05/18/2022   Zoster (Shingles) Vaccine (1 of 2) 08/04/2022*   Flu Shot  08/06/2022*   Mammogram  06/21/2022   Hemoglobin A1C  08/14/2022   Yearly kidney function blood test for diabetes  05/06/2023   Medicare Annual Wellness Visit  06/06/2023   Pneumonia Vaccine  Completed   DEXA scan (bone density measurement)  Completed   HPV Vaccine  Aged Out   DTaP/Tdap/Td vaccine  Discontinued  *Topic was postponed. The date shown is not the original due date.    Advanced directives: copy in chart  Conditions/risks identified: no   Next appointment: Follow up in one year for your annual wellness visit    Preventive Care 65 Years and Older, Female Preventive care refers to lifestyle choices and visits with your health care provider that can promote health and wellness. What does preventive care include? A yearly physical exam. This is also called an annual well check. Dental exams once or twice a year. Routine eye exams. Ask your health  care provider how often you should have your eyes checked. Personal lifestyle choices, including: Daily care of your teeth and gums. Regular physical activity. Eating a healthy diet. Avoiding tobacco and drug use. Limiting alcohol use. Practicing safe sex. Taking low-dose aspirin every day. Taking vitamin and mineral supplements as recommended by your health care provider. What happens during an annual well check? The services and screenings done by your health care provider during your annual well check will depend on your age, overall health, lifestyle risk factors, and family history of disease. Counseling  Your health care provider may ask you questions about your: Alcohol use. Tobacco use. Drug use. Emotional well-being. Home and relationship well-being. Sexual activity. Eating habits. History of falls. Memory and ability to understand (cognition). Work and work Statistician. Reproductive health. Screening  You may have the following tests or measurements: Height, weight, and BMI. Blood pressure. Lipid and cholesterol levels. These may be checked every 5 years, or more frequently if you are over 70 years old. Skin check. Lung cancer screening. You may have this screening every year starting at age 36 if you have a 30-pack-year history of smoking and currently smoke or have quit within the past 15 years. Fecal occult blood test (FOBT) of the stool. You may have this test every year starting at age 62. Flexible sigmoidoscopy or colonoscopy. You may have a sigmoidoscopy every 5 years or a colonoscopy every 10 years starting at age 48. Hepatitis C blood test. Hepatitis B blood test. Sexually transmitted disease (STD) testing. Diabetes screening. This is done by checking your blood sugar (glucose) after you have not eaten for a while (fasting). You may have this done every 1-3 years. Bone density scan. This is done to screen for osteoporosis. You may have this done starting at  age 12. Mammogram. This may be done every 1-2 years. Talk to your health care provider about how often you should have regular mammograms. Talk with your health care provider about your test results, treatment options, and if necessary, the need for more tests. Vaccines  Your health care provider may recommend certain vaccines, such as: Influenza vaccine. This is recommended every year. Tetanus, diphtheria, and acellular pertussis (Tdap, Td) vaccine. You may need a Td booster every 10 years. Zoster vaccine. You may need this after age 62. Pneumococcal 13-valent conjugate (PCV13) vaccine. One dose is recommended after age 49. Pneumococcal polysaccharide (PPSV23) vaccine. One dose is recommended after age 36. Talk to your health care provider about which screenings and vaccines you need and how often you need them. This information is not intended to replace advice given to you by your health care provider. Make sure you discuss any questions you have with  your health care provider. Document Released: 05/21/2015 Document Revised: 01/12/2016 Document Reviewed: 02/23/2015 Elsevier Interactive Patient Education  2017 [place] Prevention in the Home Falls can cause injuries. They can happen to people of all ages. There are many things you can do to make your home safe and to help prevent falls. What can I do on the outside of my home? Regularly fix the edges of walkways and driveways and fix any cracks. Remove anything that might make you trip as you walk through a door, such as a raised step or threshold. Trim any bushes or trees on the path to your home. Use bright outdoor lighting. Clear any walking paths of anything that might make someone trip, such as rocks or tools. Regularly check to see if handrails are loose or broken. Make sure that both sides of any steps have handrails. Any raised decks and porches should have guardrails on the edges. Have any leaves, snow, or ice cleared  regularly. Use sand or salt on walking paths during winter. Clean up any spills in your garage right away. This includes oil or grease spills. What can I do in the bathroom? Use night lights. Install grab bars by the toilet and in the tub and shower. Do not use towel bars as grab bars. Use non-skid mats or decals in the tub or shower. If you need to sit down in the shower, use a plastic, non-slip stool. Keep the floor dry. Clean up any water that spills on the floor as soon as it happens. Remove soap buildup in the tub or shower regularly. Attach bath mats securely with double-sided non-slip rug tape. Do not have throw rugs and other things on the floor that can make you trip. What can I do in the bedroom? Use night lights. Make sure that you have a light by your bed that is easy to reach. Do not use any sheets or blankets that are too big for your bed. They should not hang down onto the floor. Have a firm chair that has side arms. You can use this for support while you get dressed. Do not have throw rugs and other things on the floor that can make you trip. What can I do in the kitchen? Clean up any spills right away. Avoid walking on wet floors. Keep items that you use a lot in easy-to-reach places. If you need to reach something above you, use a strong step stool that has a grab bar. Keep electrical cords out of the way. Do not use floor polish or wax that makes floors slippery. If you must use wax, use non-skid floor wax. Do not have throw rugs and other things on the floor that can make you trip. What can I do with my stairs? Do not leave any items on the stairs. Make sure that there are handrails on both sides of the stairs and use them. Fix handrails that are broken or loose. Make sure that handrails are as long as the stairways. Check any carpeting to make sure that it is firmly attached to the stairs. Fix any carpet that is loose or worn. Avoid having throw rugs at the top or  bottom of the stairs. If you do have throw rugs, attach them to the floor with carpet tape. Make sure that you have a light switch at the top of the stairs and the bottom of the stairs. If you do not have them, ask someone to add them for you. What else  can I do to help prevent falls? Wear shoes that: Do not have high heels. Have rubber bottoms. Are comfortable and fit you well. Are closed at the toe. Do not wear sandals. If you use a stepladder: Make sure that it is fully opened. Do not climb a closed stepladder. Make sure that both sides of the stepladder are locked into place. Ask someone to hold it for you, if possible. Clearly mark and make sure that you can see: Any grab bars or handrails. First and last steps. Where the edge of each step is. Use tools that help you move around (mobility aids) if they are needed. These include: Canes. Walkers. Scooters. Crutches. Turn on the lights when you go into a dark area. Replace any light bulbs as soon as they burn out. Set up your furniture so you have a clear path. Avoid moving your furniture around. If any of your floors are uneven, fix them. If there are any pets around you, be aware of where they are. Review your medicines with your doctor. Some medicines can make you feel dizzy. This can increase your chance of falling. Ask your doctor what other things that you can do to help prevent falls. This information is not intended to replace advice given to you by your health care provider. Make sure you discuss any questions you have with your health care provider. Document Released: 02/18/2009 Document Revised: 09/30/2015 Document Reviewed: 05/29/2014 Elsevier Interactive Patient Education  2017 [employer].

## 2022-06-05 NOTE — Telephone Encounter (Signed)
Rx was originally sent out on 05/05/2022, 05/18/2022 and I spoke with [facility] on 05/29/2022 regarding the fax and never heard a response. I contacted [name] , pt's daughter, today and she states they haven't heard back from [employer] and she will contact them today regarding the wheelchair.

## 2022-06-05 NOTE — Telephone Encounter (Signed)
Spoke with pharmacist about the medication "LYNOX" that was prescribed for the patient. They do not have this medication in any warehouse. She also reports that the patient picked up Percocets on 06/04/22. I discussed with the pharmacist that I can not see this on my end on the PDMP, but she assures me there was pain medication picked up on 06/04/22. There was enough percocet for q4h x10days. Given this information, I have canceled the Lynox, I do not think the patient needs any additional pain medication given that her prescription was for q4h.

## 2022-06-05 NOTE — Progress Notes (Signed)
I connected with [name] today by telephone and verified that I am speaking with the correct person using two identifiers. Location patient: home Location provider: work Persons participating in the virtual visit: [name] (daughter), Nichole Durand LPN.   I discussed the limitations, risks, security and privacy concerns of performing an evaluation and management service by telephone and the availability of in person appointments. I also discussed with the patient that there may be a patient responsible charge related to this service. The patient expressed understanding and verbally consented to this telephonic visit.    Interactive audio and video telecommunications were attempted between this provider and patient, however failed, due to patient having technical difficulties OR patient did not have access to video capability.  We continued and completed visit with audio only.     Vital signs may be patient reported or missing.  Subjective:   [name] is a 88 y.o. female who presents for Medicare Annual (Subsequent) preventive examination.  Review of Systems     Cardiac Risk Factors include: advanced age (>103mn, >>27women);dyslipidemia;hypertension;diabetes mellitus;obesity (BMI >30kg/m2)     Objective:    Today's Vitals   06/05/22 1117  Weight: 290 lb (131.5 kg)  Height: '5\' 7"'$  (1.702 m)  PainSc: 9    Body mass index is 45.42 kg/m.     06/05/2022   11:30 AM 06/04/2022    6:23 PM 04/20/2022   12:57 PM 03/26/2022    7:29 PM 03/26/2022    1:36 PM 03/21/2022   10:47 AM 03/16/2022    2:45 PM  Advanced Directives  Does Patient Have a Medical Advance Directive? Yes No No No No Yes Yes  Type of Advance Directive Out of facility DNR (pink MOST or yellow form)     Living will;Healthcare Power of Attorney   Does patient want to make changes to medical advance directive?    No - Patient declined No - Patient declined  No - Patient declined  Would patient like  information on creating a medical advance directive?  No - Patient declined No - Guardian declined No - Patient declined No - Patient declined      Current Medications (verified) Outpatient Encounter Medications as of 06/05/2022  Medication Sig   albuterol (PROAIR HFA) 108 (90 Base) MCG/ACT inhaler Inhale 1-2 puffs into the lungs every 6 (six) hours as needed for wheezing or shortness of breath.   anastrozole (ARIMIDEX) 1 MG tablet TAKE 1 TABLET BY MOUTH EVERY DAY   apixaban (ELIQUIS) 5 MG TABS tablet Take 1 tablet (5 mg total) by mouth 2 (two) times daily.   atorvastatin (LIPITOR) 80 MG tablet TAKE 1 TABLET BY MOUTH EVERY DAY   BELSOMRA 5 MG TABS TAKE 1 TABLET BY MOUTH AT BEDTIME AS NEEDED FOR INSOMNIA   carvedilol (COREG) 12.5 MG tablet TAKE 1 TABLET BY MOUTH 2 TIMES DAILY   cetirizine (ZYRTEC) 10 MG tablet Take 10 mg by mouth daily as needed for allergies or rhinitis.   Continuous Blood Gluc Sensor (FREESTYLE LIBRE 14 DAY SENSOR) MISC PLACE 1 DEVICE ON THE SKIN AS DIRECTED EVERY 14 DAYS   diclofenac Sodium (VOLTAREN) 1 % GEL Apply 2 g topically 4 (four) times daily. (Patient taking differently: Apply 2 g topically 4 (four) times daily as needed (pain).)   DULoxetine (CYMBALTA) 30 MG capsule TAKE 1 CAPSULE BY MOUTH EVERY DAY   febuxostat (ULORIC) 40 MG tablet Take 1 tablet (40 mg total) by mouth daily.   fluticasone (FLONASE) 50  MCG/ACT nasal spray Place 1 spray into both nostrils in the morning and at bedtime. (Patient taking differently: Place 1 spray into both nostrils 2 (two) times daily as needed for rhinitis or allergies.)   folic acid (FOLVITE) 1 MG tablet TAKE 1 TABLET BY MOUTH EVERY DAY   glucagon 1 MG injection Inject 1 mg into the vein once as needed for up to 1 dose.   glucose 4 GM chewable tablet Chew 1 tablet (4 g total) by mouth as needed for low blood sugar.   guaifenesin (ROBITUSSIN) 100 MG/5ML syrup Take 200 mg by mouth 3 (three) times daily as needed for cough.   insulin  glargine, 2 Unit Dial, (TOUJEO MAX SOLOSTAR) 300 UNIT/ML Solostar Pen Inject 40 Units into the skin daily. Patient assistance program provides. Patient reports 40 units 04/24/2022   linaclotide (LINZESS) 145 MCG CAPS capsule Take 1 capsule (145 mcg total) by mouth daily before breakfast.   meclizine (ANTIVERT) 25 MG tablet TAKE 1 TABLET BY MOUTH THREE TIMES DAILY AS NEEDED FOR DIZZINESS   memantine (NAMENDA) 5 MG tablet TAKE 2 TABLETS BY MOUTH EVERY DAY and TAKE 1 TABLET BY MOUTH EVERY EVENING (Patient taking differently: Take 5-10 mg by mouth See admin instructions. TAKE 2 TABLETS BY MOUTH EVERY DAY and TAKE 1 TABLET BY MOUTH EVERY EVENING)   nitroGLYCERIN (NITROSTAT) 0.4 MG SL tablet Take one tablet under the tongue every 5 minutes as needed for chest pain   NOVOLOG FLEXPEN 100 UNIT/ML FlexPen Inject 14 Units into the skin 2 (two) times daily with a meal. Max daily dose 70 units   nystatin cream (MYCOSTATIN) Apply 1 Application topically 2 (two) times daily.   ondansetron (ZOFRAN) 4 MG tablet Take 1 tablet (4 mg total) by mouth every 8 (eight) hours as needed for nausea or vomiting.   oxyCODONE-acetaminophen (PERCOCET) 7.5-325 MG tablet Take 1 tablet by mouth every 12 (twelve) hours.   pantoprazole (PROTONIX) 20 MG tablet TAKE 1 TABLET BY MOUTH EVERY DAY   sodium polystyrene (SPS) 15 GM/60ML suspension TAKE 60MLS BY MOUTH ONCE WEEKLY TO KEEP POTASSIUM DOWN   SYMBICORT 160-4.5 MCG/ACT inhaler Inhale 2 puffs by mouth twice daily   torsemide (DEMADEX) 20 MG tablet TAKE 1 TABLET BY MOUTH EVERY DAY Take an extra dose for weight gain, 3lb in 1 day or 5lbs in 1 week   Vitamin D, Ergocalciferol, (DRISDOL) 1.25 MG (50000 UNIT) CAPS capsule TAKE 1 CAPSULE BY MOUTH every 7 days   Elastic Bandages & Supports (B & B CARPAL TUNNEL BRACE) MISC Use brace on wrist as needed for wrist pain (Patient not taking: Reported on 06/05/2022)   montelukast (SINGULAIR) 10 MG tablet Take 1 tablet (10 mg total) by mouth at  bedtime. (Patient not taking: Reported on 06/05/2022)   No facility-administered encounter medications on file as of 06/05/2022.    Allergies (verified) Sulfonamide derivatives, Lokelma [sodium zirconium cyclosilicate], Aricept [donepezil hcl], and Tramadol   History: Past Medical History:  Diagnosis Date   Abdominal pain 01/26/2012   Acute kidney injury superimposed on chronic kidney disease ([place]) 12/18/2014   Allergy    takes Mucinex daily as needed   Anemia, unspecified    Anxiety    takes Clonazepam daily as needed   Anxiety associated with depression 06/08/2017   Arthritis    Breast cancer ([place])    Breast cancer screening 02/19/2014   Breast mass 10/27/2014   Chronic diastolic CHF (congestive heart failure) ([place])    takes Furosemide daily  Chronic pain syndrome 02/06/2015   CKD (chronic kidney disease), stage III ([place])    Coronary artery disease    a. s/p IMI 2004 tx with BMS to RCA;  b. s/p Promus DES to RCA 2/12 c. abnormal nuc 2016 -> cath with med rx. d. NSTEMI 02/2020  mv CAD with severe ISR in pRCA and m/dRCA lesion both treated with DES.   Debility 08/01/2012   Dementia ([place])    Depression    takes Cymbalta daily   Diabetes mellitus    insulin daily   DOE (dyspnea on exertion) 06/24/2014   Dysphagia, unspecified(787.20) 07/31/2012   Fungal dermatitis 11/13/2007   Qualifier: Diagnosis of  By: Burnice Logan  MD, Doretha Sou    GERD (gastroesophageal reflux disease)    takes Protonix daily   Gout, unspecified    Headache    occasionally   History of blood clots    History of deep venous thrombosis 01/27/2012   History of pulmonary embolus (PE) 04/22/2014   Hyperkalemia 07/26/2017   Hyperlipidemia    takes Pravastatin daily   Hypertension    Insomnia    Joint pain    Joint swelling    Morbid obesity ([place])    Multiple benign nevi 11/15/2016   Myocardial infarction ([place]) 2004   Non-STEMI (non-ST elevated myocardial infarction) ([place]) 02/15/2020   Numbness     Obstructive sleep apnea    does not wear cpap   Osteoarthritis    Osteoarthritis    Osteoarthrosis, unspecified whether generalized or localized, lower leg    Pain in the chest 12/31/2013   Pain, chronic    Personal history of other diseases of the digestive system 12/03/2006   Qualifier: Diagnosis of  By: Burnice Logan  MD, Doretha Sou    Polymyalgia rheumatica [facility])    Presence of stent in right coronary artery    Pulmonary emboli ([place]) 01/2012   felt to need lifelong anticoagulation but Xarelto dc in 02/2020 due to need for DAPT   Situational depression 06/04/2009   Qualifier: Diagnosis of  By: Burnice Logan  MD, Doretha Sou    Syncope, vasovagal 07/04/2021   Type 2 diabetes mellitus with hyperlipidemia ([place]) 02/16/2022   Urinary incontinence    takes Linzess daily   Vaginitis and vulvovaginitis 06/23/2016   Vertigo    hx of;was taking Meclizine if needed   Wears dentures    Past Surgical History:  Procedure Laterality Date   ABDOMINAL HYSTERECTOMY     partial   APPENDECTOMY     blood clots/legs and lungs  2013   BREAST BIOPSY Left 07/22/2014   BREAST BIOPSY Left 02/10/2013   BREAST LUMPECTOMY Left 11/05/2014   BREAST LUMPECTOMY WITH RADIOACTIVE SEED LOCALIZATION Left 11/05/2014   Procedure: LEFT BREAST LUMPECTOMY WITH RADIOACTIVE SEED LOCALIZATION;  Surgeon: [name], MD;  Location: [place];  Service: General;  Laterality: Left;   CARDIAC CATHETERIZATION     COLONOSCOPY     CORONARY ANGIOPLASTY  2   CORONARY STENT INTERVENTION N/A 02/17/2020   Procedure: CORONARY STENT INTERVENTION;  Surgeon: Leonie Man, MD;  Location: [place] [facility];  Service: Cardiovascular;  Laterality: N/A;   ESOPHAGOGASTRODUODENOSCOPY (EGD) WITH PROPOFOL N/A 11/07/2016   Procedure: ESOPHAGOGASTRODUODENOSCOPY (EGD) WITH PROPOFOL;  Surgeon: Gatha Mayer, MD;  Location: [facility];  Service: Endoscopy;  Laterality: N/A;   EXCISION OF SKIN TAG Right 11/05/2014   Procedure: EXCISION OF RIGHT EYELID  SKIN TAG;  Surgeon: [name], MD;  Location: [place];  Service: General;  Laterality: Right;   EYE SURGERY Bilateral    cataract    GASTRIC BYPASS  1977    reversed in 1979, [place] CATH AND CORONARY ANGIOGRAPHY N/A 02/17/2020   Procedure: LEFT HEART CATH AND CORONARY ANGIOGRAPHY;  Surgeon: Leonie Man, MD;  Location: [place] [facility];  Service: Cardiovascular;  Laterality: N/A;   LEFT HEART CATHETERIZATION WITH CORONARY ANGIOGRAM N/A 06/29/2014   Procedure: LEFT HEART CATHETERIZATION WITH CORONARY ANGIOGRAM;  Surgeon: Troy Sine, MD;  Location: [facility] CATH LAB;  Service: Cardiovascular;  Laterality: N/A;   MEMBRANE PEEL Right 10/23/2018   Procedure: MEMBRANE PEEL;  Surgeon: Hurman Horn, MD;  Location: [place];  Service: Ophthalmology;  Laterality: Right;   MI with stent placement  2004   PARS PLANA VITRECTOMY Right 10/23/2018   Procedure: PARS PLANA VITRECTOMY WITH 25 GAUGE;  Surgeon: Hurman Horn, MD;  Location: [place];  Service: Ophthalmology;  Laterality: Right;   Family History  Problem Relation Age of Onset   Breast cancer Mother 28   Heart disease Mother    Throat cancer Father    Hypertension Father    Arthritis Father    Diabetes Father    Arthritis Sister    Obesity Sister    Diabetes Sister    Heart disease Cousin    Colon cancer Neg Hx    Stomach cancer Neg Hx    Esophageal cancer Neg Hx    Social History   Socioeconomic History   Marital status: Widowed    Spouse name: Not on file   Number of children: 3   Years of education: Not on file   Highest education level: High school graduate  Occupational History   Occupation: retired  Tobacco Use   Smoking status: Never    Passive exposure: Past   Smokeless tobacco: Never   Tobacco comments:    Both parents smoked, patient was exposed/ "Raised up in smoke, my whole life."  Vaping Use   Vaping Use: Never used  Substance and Sexual Activity   Alcohol use: No    Alcohol/week: 0.0  standard drinks of alcohol   Drug use: No   Sexual activity: Not Currently  Other Topics Concern   Not on file  Social History Narrative   Not on file   Social Determinants of Health   Financial Resource Strain: Low Risk  (06/05/2022)   Overall Financial Resource Strain (CARDIA)    Difficulty of Paying Living Expenses: Not hard at all  Food Insecurity: No Food Insecurity (06/05/2022)   Hunger Vital Sign    Worried About Running Out of Food in the Last Year: Never true    Ran Out of Food in the Last Year: Never true  Transportation Needs: No Transportation Needs (06/05/2022)   PRAPARE - Hydrologist (Medical): No    Lack of Transportation (Non-Medical): No  Physical Activity: Insufficiently Active (06/05/2022)   Exercise Vital Sign    Days of Exercise per Week: 3 days    Minutes of Exercise per Session: 10 min  Stress: Stress Concern Present (06/05/2022)   [place]    Feeling of Stress : Rather much  Social Connections: Moderately Isolated (04/20/2022)   Social Connection and Isolation Panel [NHANES]    Frequency of Communication with Friends and Family: Once a week    Frequency of Social Gatherings with Friends and Family: Once a week  Attends Religious Services: 1 to 4 times per year    Active Member of Clubs or Organizations: Yes    Attends Archivist Meetings: 1 to 4 times per year    Marital Status: Widowed    Tobacco Counseling Counseling given: Not Answered Tobacco comments: Both parents smoked, patient was exposed/ "Raised up in smoke, my whole life."   Clinical Intake:  Pre-visit preparation completed: Yes  Pain : 0-10 Pain Score: 9  Pain Type: Acute pain Pain Location: Hip Pain Orientation: Left Pain Descriptors / Indicators: Aching Pain Onset: 1 to 4 weeks ago Pain Frequency: Constant Pain Relieving Factors: oxycodone  Pain Relieving Factors:  oxycodone  Nutritional Status: BMI > 30  Obese Nutritional Risks: None Diabetes: Yes  How often do you need to have someone help you when you read instructions, pamphlets, or other written materials from your doctor or pharmacy?: 1 - Never  Diabetic? Yes Nutrition Risk Assessment:  Has the patient had any N/V/D within the last 2 months?  No  Does the patient have any non-healing wounds?  No  Has the patient had any unintentional weight loss or weight gain?  No   Diabetes:  Is the patient diabetic?  Yes  If diabetic, was a CBG obtained today?  No  Did the patient bring in their glucometer from home?  No  How often do you monitor your CBG's? 2-3 daily.   Financial Strains and Diabetes Management:  Are you having any financial strains with the device, your supplies or your medication? No .  Does the patient want to be seen by Chronic Care Management for management of their diabetes?  No  Would the patient like to be referred to a Nutritionist or for Diabetic Management?  No   Diabetic Exams:  Diabetic Eye Exam: Overdue for diabetic eye exam. Pt has been advised about the importance in completing this exam. Patient advised to call and schedule an eye exam. Diabetic Foot Exam: Completed 05/18/2021   Interpreter Needed?: No  Information entered by :: NAllen LPN   Activities of Daily Living    06/05/2022   11:36 AM 03/29/2022   10:00 AM  In your present state of health, do you have any difficulty performing the following activities:  Hearing? 1 0  Vision? 1 0  Comment some blurriness   Difficulty concentrating or making decisions? 1 0  Walking or climbing stairs? 1 1  Dressing or bathing? 1 1  Doing errands, shopping? 1 1  Preparing Food and eating ? Y   Using the Toilet? N   In the past six months, have you accidently leaked urine? N   Do you have problems with loss of bowel control? N   Managing your Medications? Y   Managing your Finances? Y   Housekeeping or  managing your Housekeeping? Y     Patient Care Team: Bonnita Hollow, MD as PCP - General (Family Medicine) [name], [name], MD (Inactive) as Consulting Physician (Cardiology) [name], MD as Consulting Physician (Ophthalmology) [name], MD as Consulting Physician (General Surgery) [name], MD (Inactive) as Referring Physician (Obstetrics and Gynecology) [name] [name], MD as Consulting Physician (Ophthalmology) [facility], [name], MD as Consulting Physician (Endocrinology) [name], MD as Consulting Physician (Hematology) [name], DO as Consulting Physician (Neurology) Kassie Mends, RN as [facility] Johney Frame, Greer Ee, MD as Consulting Physician (Cardiology)  Indicate any recent [place] you may have received from other than  [facility] providers in the past year (date may be approximate).     Assessment:   This is a routine wellness examination for [place].  Hearing/Vision screen Vision Screening - Comments:: No regular eye exams,  Dietary issues and exercise activities discussed: Current Exercise Habits: Home exercise routine, Type of exercise: stretching, Time (Minutes): 10, Frequency (Times/Week): 3, Weekly Exercise (Minutes/Week): 30   Goals Addressed             This Visit's Progress    Patient Stated       06/05/2022, wants to get better       Depression Screen    06/05/2022   11:32 AM 04/20/2022   12:47 PM 03/21/2022   10:46 AM 12/27/2021    9:50 AM 12/05/2021    1:50 PM 10/06/2021    8:41 AM 04/25/2021    4:33 PM  PHQ 2/9 Scores  PHQ - 2 Score '6 1 2 1 6 '$ 0 3  PHQ- 9 Score '21  8  21  18    '$ Fall Risk    06/05/2022   11:31 AM 04/20/2022   12:51 PM 03/21/2022   10:46 AM 12/27/2021    9:50 AM 10/11/2021    1:20 PM  [place] in the past year? 0 0 0 0 0  Number falls in past yr: 0      Injury with Fall? 0      Risk for fall due to : Medication side effect;Impaired  mobility;Impaired balance/gait      Follow up Falls prevention discussed;Education provided;Falls evaluation completed        FALL RISK PREVENTION PERTAINING TO THE HOME:  Any stairs in or around the home? No  If so, are there any without handrails? N/a Home free of loose throw rugs in walkways, pet beds, electrical cords, etc? Yes  Adequate lighting in your home to reduce risk of falls? Yes   ASSISTIVE DEVICES UTILIZED TO PREVENT FALLS:  Life alert? No  Use of a cane, walker or w/c? Yes  Grab bars in the bathroom? Yes  Shower chair or bench in shower? Yes  Elevated toilet seat or a handicapped toilet? Yes   TIMED UP AND GO:  Was the test performed? No .      Cognitive Function:    04/12/2018    2:13 PM 08/02/2016    2:51 PM 08/02/2016   11:46 AM 07/27/2016    9:54 AM 03/24/2016    3:53 PM  MMSE - Mini Mental State Exam  Orientation to time '4 4 4 4 5  '$ Orientation to Place '5 5 5 5 5  '$ Registration '3 3 3 3 3  '$ Attention/ Calculation '5 5 5 5 '$ 0  Recall '1 1 1 1 2  '$ Language- name 2 objects '2 2 2 2 2  '$ Language- repeat '1 1 1 1 1  '$ Language- follow 3 step command '3 2 2 1 3  '$ Language- read & follow direction '1 1 1 1 1  '$ Write a sentence '1 1 1 1 1  '$ Copy design 0 '1 1 1 1  '$ Total score '26 26 26 25 24        '$ 06/05/2022   11:38 AM 04/25/2021    4:29 PM 04/22/2020   11:07 AM 04/14/2019    1:58 PM  6CIT Screen  What Year? 0 points 4 points 0 points 0 points  What month? 0 points 0 points 0 points 0 points  What time? 0 points 0 points 0  points 0 points  Count back from 20 4 points 2 points 0 points 4 points  Months in reverse 4 points 4 points 4 points 4 points  Repeat phrase 4 points 2 points 6 points 8 points  Total Score 12 points 12 points 10 points 16 points    Immunizations Immunization History  Administered Date(s) Administered   Fluad Quad(high Dose 65+) 02/12/2019, 02/18/2020, 01/26/2021   Influenza Whole 03/14/2007, 02/13/2008, 01/18/2010   Influenza, High Dose  Seasonal PF 01/16/2017, 03/11/2018   Influenza,inj,Quad PF,6+ Mos 01/08/2013, 02/18/2014, 01/18/2015, 01/24/2016   Influenza-Unspecified 02/15/2012, 02/18/2014   PFIZER(Purple Top)SARS-COV-2 Vaccination 07/06/2019, 08/05/2019, 12/30/2019   Pneumococcal Conjugate-13 02/27/2017   Pneumococcal Polysaccharide-23 05/08/2002   Td 05/08/2008    TDAP status: Due, Education has been provided regarding the importance of this vaccine. Advised may receive this vaccine at local pharmacy or Health Dept. Aware to provide a copy of the vaccination record if obtained from local pharmacy or Health Dept. Verbalized acceptance and understanding.  Flu Vaccine status: Due, Education has been provided regarding the importance of this vaccine. Advised may receive this vaccine at local pharmacy or Health Dept. Aware to provide a copy of the vaccination record if obtained from local pharmacy or Health Dept. Verbalized acceptance and understanding.  Pneumococcal vaccine status: Up to date  Covid-19 vaccine status: Completed vaccines  Qualifies for Shingles Vaccine? Yes   Zostavax completed No   Shingrix Completed?: No.    Education has been provided regarding the importance of this vaccine. Patient has been advised to call insurance company to determine out of pocket expense if they have not yet received this vaccine. Advised may also receive vaccine at local pharmacy or Health Dept. Verbalized acceptance and understanding.  Screening Tests Health Maintenance  Topic Date Due   Diabetic kidney evaluation - Urine ACR  06/14/2018   OPHTHALMOLOGY EXAM  08/26/2020   COVID-19 Vaccine (4 - 2023-24 season) 01/06/2022   Medicare Annual Wellness (AWV)  04/25/2022   FOOT EXAM  05/18/2022   Zoster Vaccines- Shingrix (1 of 2) 08/04/2022 (Originally 10/16/1953)   INFLUENZA VACCINE  08/06/2022 (Originally 12/06/2021)   MAMMOGRAM  06/21/2022   HEMOGLOBIN A1C  08/14/2022   Diabetic kidney evaluation - eGFR measurement  05/06/2023    Pneumonia Vaccine 18+ Years old  Completed   DEXA SCAN  Completed   HPV VACCINES  Aged Out   DTaP/Tdap/Td  Discontinued    Health Maintenance  Health Maintenance Due  Topic Date Due   Diabetic kidney evaluation - Urine ACR  06/14/2018   OPHTHALMOLOGY EXAM  08/26/2020   COVID-19 Vaccine (4 - 2023-24 season) 01/06/2022   Medicare Annual Wellness (AWV)  04/25/2022   FOOT EXAM  05/18/2022    Colorectal cancer screening: No longer required.   Mammogram status: scheduled for 07/07/2022  Bone Density status: Completed 03/12/2017.   Lung Cancer Screening: (Low Dose CT Chest recommended if Age 11-80 years, 30 pack-year currently smoking OR have quit w/in 15years.) does not qualify.   Lung Cancer Screening Referral: no  Additional Screening:  Hepatitis C Screening: does not qualify;   Vision Screening: Recommended annual ophthalmology exams for early detection of glaucoma and other disorders of the eye. Is the patient up to date with their annual eye exam?  No  Who is the provider or what is the name of the office in which the patient attends annual eye exams? none If pt is not established with a provider, would they like to be referred to a provider  to establish care? No .   Dental Screening: Recommended annual dental exams for proper oral hygiene  Community Resource Referral / Chronic Care Management: CRR required this visit?  No   CCM required this visit?  No      Plan:     I have personally reviewed and noted the following in the patient's chart:   Medical and social history Use of alcohol, tobacco or illicit drugs  Current medications and supplements including opioid prescriptions. Patient is currently taking opioid prescriptions. Information provided to patient regarding non-opioid alternatives. Patient advised to discuss non-opioid treatment plan with their provider. Functional ability and status Nutritional status Physical activity Advanced directives List of  other physicians Hospitalizations, surgeries, and ER visits in previous 12 months Vitals Screenings to include cognitive, depression, and falls Referrals and appointments  In addition, I have reviewed and discussed with patient certain preventive protocols, quality metrics, and best practice recommendations. A written personalized care plan for preventive services as well as general preventive health recommendations were provided to patient.     Kellie Simmering, LPN   [id number]/56/[id number]   Nurse Notes: Patient states that belsomra and dulexotine are not helping. Appointment made to see PCP on 06/13/2022.   Due to this being a virtual visit, the after visit summary with patients personalized plan was offered to patient via mail or my-chart.  to pick up at office at next visit

## 2022-06-07 ENCOUNTER — Other Ambulatory Visit: Payer: Self-pay | Admitting: Hematology

## 2022-06-07 ENCOUNTER — Other Ambulatory Visit: Payer: Self-pay | Admitting: Family Medicine

## 2022-06-07 DIAGNOSIS — I10 Essential (primary) hypertension: Secondary | ICD-10-CM

## 2022-06-07 DIAGNOSIS — Z794 Long term (current) use of insulin: Secondary | ICD-10-CM

## 2022-06-07 DIAGNOSIS — E119 Type 2 diabetes mellitus without complications: Secondary | ICD-10-CM

## 2022-06-09 ENCOUNTER — Telehealth: Payer: Self-pay | Admitting: Family Medicine

## 2022-06-09 ENCOUNTER — Telehealth: Payer: Self-pay

## 2022-06-09 NOTE — Telephone Encounter (Signed)
Pt experiencing vertigo, can't hold head up, chest pain with deep breathes. Pt advised by Triage RN to go to the ED. Pt refuses, wants to speak with her PCP.

## 2022-06-09 NOTE — Telephone Encounter (Signed)
Patients daughter called and said patient has vertigo and she is having pain in her right arm, she's not able to turn her neck and not able to keep her head up. I transferred to triage for further eval

## 2022-06-10 NOTE — Progress Notes (Deleted)
Cardiology Office Note:    Date:  06/10/2022   ID:  [name], DOB [birth year], MRN [id number]  PCP:  Bonnita Hollow, MD   [facility] Providers Cardiologist:  None {   Referring MD: Bonnita Hollow, MD    History of Present Illness:    [name] is a 88 y.o. female with a hx of dementia, CAD (BMS to RCA 2004, DESx2 to mid and proximal RCA 2012, recent NSTEMI 02/2020 s/p DES to RCA), chronic diastolic CHF, DM, morbid obesity, remote gastric bypass, PMR, HLD, depression, OCD, anxiety, COPD, prior PE/DVT, CKD stage III, breast CA who presents to clinic for follow-up.  Per review of the record, the patient has history of multiple medical problems as outlined above. She was admitted 02/2020 with chest pain/NSTEMI and underwent cath showing multivessel CAD but severe ISR in pRCA and m/dRCA lesion both treated with DES. There was diffuse mild to moderate disease in the LM as well as Lcx and LAD but nonobstructive and treated medically. LVEF was normal, borderline elevated LVEDP. 2D echo was technically difficult given habitus. Hospital course also notable for anemia requiring blood transfusion (has prior h/o anemia). IM discussed case with Dr. Irene Limbo, her oncologist, regarding ongoing maintenance Xarelto for prior h/o PE/DVT and the mutual decision was made to hold further Xarelto given need for DAPT and history of anemia.   Was seen in 02/2020 with [name] where she was tachycardic with elevated D-dimer. V/Q scan obtained without evidence of PE.  Was admitted 10/13/21 with acute on chronic HFpEF. Diuresed with IV lasix and later transitioned to lasix 40 mg po daily. Discharge weight 315 pounds. Discharged 10/22/21.   Was seen in clinic by [name], NP on 10/25/21 where she was wheelchair bound and required assistance with all ADLs. She was continued on lasix '40mg'$  daily with extra dose as needed.  Was seen in the ER on 03/2022 where she was diagnosed with a DVT and was started on  apixaban.  Rep-resented to the ER on 03/29/22 with chest pain and melena. CT scan of the abdomen and pelvis completed was with no signs of active bleeding. VQ scan was completed showing small PEs bilaterally. Patient was placed on heparin drip for anticoagulation with hemoglobin remaining stable. She was transitioned to apixaban on discharge.   Was last seen in clinic on 05/2022 by [name] where she was stable from a CV perspective. No further bleeding. Was tolerating apixaban.  Today, ***    Past Medical History:  Diagnosis Date   Abdominal pain 01/26/2012   Acute kidney injury superimposed on chronic kidney disease ([place]) 12/18/2014   Allergy    takes Mucinex daily as needed   Anemia, unspecified    Anxiety    takes Clonazepam daily as needed   Anxiety associated with depression 06/08/2017   Arthritis    Breast cancer ([place])    Breast cancer screening 02/19/2014   Breast mass 10/27/2014   Chronic diastolic CHF (congestive heart failure) ([place])    takes Furosemide daily   Chronic pain syndrome 02/06/2015   CKD (chronic kidney disease), stage III ([place])    Coronary artery disease    a. s/p IMI 2004 tx with BMS to RCA;  b. s/p Promus DES to RCA 2/12 c. abnormal nuc 2016 -> cath with med rx. d. NSTEMI 02/2020  mv CAD with severe ISR in pRCA and m/dRCA lesion both treated with DES.   Debility 08/01/2012   Dementia ([place])  Depression    takes Cymbalta daily   Diabetes mellitus    insulin daily   DOE (dyspnea on exertion) 06/24/2014   Dysphagia, unspecified(787.20) 07/31/2012   Fungal dermatitis 11/13/2007   Qualifier: Diagnosis of  By: Burnice Logan  MD, Doretha Sou    GERD (gastroesophageal reflux disease)    takes Protonix daily   Gout, unspecified    Headache    occasionally   History of blood clots    History of deep venous thrombosis 01/27/2012   History of pulmonary embolus (PE) 04/22/2014   Hyperkalemia 07/26/2017   Hyperlipidemia    takes Pravastatin daily    Hypertension    Insomnia    Joint pain    Joint swelling    Morbid obesity ([place])    Multiple benign nevi 11/15/2016   Myocardial infarction ([place]) 2004   Non-STEMI (non-ST elevated myocardial infarction) ([place]) 02/15/2020   Numbness    Obstructive sleep apnea    does not wear cpap   Osteoarthritis    Osteoarthritis    Osteoarthrosis, unspecified whether generalized or localized, lower leg    Pain in the chest 12/31/2013   Pain, chronic    Personal history of other diseases of the digestive system 12/03/2006   Qualifier: Diagnosis of  By: Burnice Logan  MD, Doretha Sou    Polymyalgia rheumatica [facility])    Presence of stent in right coronary artery    Pulmonary emboli ([place]) 01/2012   felt to need lifelong anticoagulation but Xarelto dc in 02/2020 due to need for DAPT   Situational depression 06/04/2009   Qualifier: Diagnosis of  By: Burnice Logan  MD, Doretha Sou    Syncope, vasovagal 07/04/2021   Type 2 diabetes mellitus with hyperlipidemia ([name]) 02/16/2022   Urinary incontinence    takes Linzess daily   Vaginitis and vulvovaginitis 06/23/2016   Vertigo    hx of;was taking Meclizine if needed   Wears dentures     Past Surgical History:  Procedure Laterality Date   ABDOMINAL HYSTERECTOMY     partial   APPENDECTOMY     blood clots/legs and lungs  2013   BREAST BIOPSY Left 07/22/2014   BREAST BIOPSY Left 02/10/2013   BREAST LUMPECTOMY Left 11/05/2014   BREAST LUMPECTOMY WITH RADIOACTIVE SEED LOCALIZATION Left 11/05/2014   Procedure: LEFT BREAST LUMPECTOMY WITH RADIOACTIVE SEED LOCALIZATION;  Surgeon: [name], MD;  Location: [place];  Service: General;  Laterality: Left;   CARDIAC CATHETERIZATION     COLONOSCOPY     CORONARY ANGIOPLASTY  2   CORONARY STENT INTERVENTION N/A 02/17/2020   Procedure: CORONARY STENT INTERVENTION;  Surgeon: Leonie Man, MD;  Location: [place] [facility];  Service: Cardiovascular;  Laterality: N/A;   ESOPHAGOGASTRODUODENOSCOPY (EGD) WITH PROPOFOL N/A  11/07/2016   Procedure: ESOPHAGOGASTRODUODENOSCOPY (EGD) WITH PROPOFOL;  Surgeon: Gatha Mayer, MD;  Location: [facility];  Service: Endoscopy;  Laterality: N/A;   EXCISION OF SKIN TAG Right 11/05/2014   Procedure: EXCISION OF RIGHT EYELID SKIN TAG;  Surgeon: [name], MD;  Location: [place];  Service: General;  Laterality: Right;   EYE SURGERY Bilateral    cataract    GASTRIC BYPASS  1977    reversed in 1979, [place] CATH AND CORONARY ANGIOGRAPHY N/A 02/17/2020   Procedure: LEFT HEART CATH AND CORONARY ANGIOGRAPHY;  Surgeon: Leonie Man, MD;  Location: [place] [facility];  Service: Cardiovascular;  Laterality: N/A;   LEFT HEART CATHETERIZATION WITH CORONARY ANGIOGRAM N/A 06/29/2014   Procedure:  LEFT HEART CATHETERIZATION WITH CORONARY ANGIOGRAM;  Surgeon: Troy Sine, MD;  Location: [facility];  Service: Cardiovascular;  Laterality: N/A;   MEMBRANE PEEL Right 10/23/2018   Procedure: MEMBRANE PEEL;  Surgeon: Hurman Horn, MD;  Location: [place];  Service: Ophthalmology;  Laterality: Right;   MI with stent placement  2004   PARS PLANA VITRECTOMY Right 10/23/2018   Procedure: PARS PLANA VITRECTOMY WITH 25 GAUGE;  Surgeon: Hurman Horn, MD;  Location: [place];  Service: Ophthalmology;  Laterality: Right;    Current Medications: No outpatient medications have been marked as taking for the 06/12/22 encounter (Appointment) with Freada Bergeron, MD.     Allergies:   Sulfonamide derivatives, Lokelma [sodium zirconium cyclosilicate], Aricept [donepezil hcl], and Tramadol   Social History   Socioeconomic History   Marital status: Widowed    Spouse name: Not on file   Number of children: 3   Years of education: Not on file   Highest education level: High school graduate  Occupational History   Occupation: retired  Tobacco Use   Smoking status: Never    Passive exposure: Past   Smokeless tobacco: Never   Tobacco comments:    Both parents smoked, patient  was exposed/ "Raised up in smoke, my whole life."  Vaping Use   Vaping Use: Never used  Substance and Sexual Activity   Alcohol use: No    Alcohol/week: 0.0 standard drinks of alcohol   Drug use: No   Sexual activity: Not Currently  Other Topics Concern   Not on file  Social History Narrative   Not on file   Social Determinants of Health   Financial Resource Strain: Low Risk  (06/05/2022)   Overall Financial Resource Strain (CARDIA)    Difficulty of Paying Living Expenses: Not hard at all  Food Insecurity: No Food Insecurity (06/05/2022)   Hunger Vital Sign    Worried About Running Out of Food in the Last Year: Never true    Ran Out of Food in the Last Year: Never true  Transportation Needs: No Transportation Needs (06/05/2022)   PRAPARE - Hydrologist (Medical): No    Lack of Transportation (Non-Medical): No  Physical Activity: Insufficiently Active (06/05/2022)   Exercise Vital Sign    Days of Exercise per Week: 3 days    Minutes of Exercise per Session: 10 min  Stress: Stress Concern Present (06/05/2022)   [place]    Feeling of Stress : Rather much  Social Connections: Moderately Isolated (04/20/2022)   Social Connection and Isolation Panel [NHANES]    Frequency of Communication with Friends and Family: Once a week    Frequency of Social Gatherings with Friends and Family: Once a week    Attends Religious Services: 1 to 4 times per year    Active Member of [employer] or Organizations: Yes    Attends Archivist Meetings: 1 to 4 times per year    Marital Status: Widowed     Family History: [name] family history includes Arthritis in her father and sister; Breast cancer (age of onset: 2) in her mother; Diabetes in her father and sister; Heart disease in her cousin and mother; Hypertension in her father; Obesity in her sister; Throat cancer in her father. There is no  history of Colon cancer, Stomach cancer, or Esophageal cancer.  ROS:   Please see the history of present illness.    Review  of Systems  Constitutional:  Negative for fever.  HENT:  Negative for sore throat.   Eyes:  Negative for blurred vision.  Respiratory:  Positive for shortness of breath. Negative for cough.   Cardiovascular:  Negative for chest pain, palpitations, orthopnea, claudication, leg swelling and PND.  Gastrointestinal:  Positive for abdominal pain (left). Negative for blood in stool, diarrhea and melena.  Genitourinary:  Negative for hematuria.  Musculoskeletal:  Negative for falls.  Skin:  Negative for itching.  Neurological:  Negative for dizziness, loss of consciousness and headaches.  Endo/Heme/Allergies:  Negative for environmental allergies. Does not bruise/bleed easily.  Psychiatric/Behavioral:  Negative for depression.      EKGs/Labs/Other Studies Reviewed:    The following studies were reviewed today:  Echo 07/05/21 1. Left ventricular ejection fraction, by estimation, is 55 to 60%. The  left ventricle has normal function. Left ventricular endocardial border  not optimally defined to evaluate regional wall motion- there are no wall  motion abnormalities seen in views  obtained. Left ventricular diastolic parameters are consistent with Grade  I diastolic dysfunction (impaired relaxation).   2. Right ventricular systolic function is normal. The right ventricular  size is normal.   3. The mitral valve was not well visualized. No evidence of mitral valve  regurgitation. No evidence of mitral stenosis.   4. The aortic valve was not well visualized. Aortic valve regurgitation  is not visualized.   Comparison(s): Difficult comparison- this is a technically difficult  study.   Bilateral LE Venous Doppler 03/03/2020:  Summary:  RIGHT:  - There is no evidence of deep vein thrombosis in the lower extremity.  However, portions of this examination were limited-  see technologist  comments above.     - No cystic structure found in the popliteal fossa.     LEFT:  - There is no evidence of deep vein thrombosis in the lower extremity.  However, portions of this examination were limited- see technologist  comments above.     - No cystic structure found in the popliteal fossa.  L Heart Cath Q000111Q LV end diastolic pressure is mildly elevated. ------------------------------ Mid LM to Prox LAD lesion is 30% stenosed with 30% stenosed side branch in Ost Cx. Prox LAD to Mid LAD lesion is 45% stenosed with 25% stenosed side branch in 1st Diag. 1st Mrg lesion is 100% stenosed. ---------------- Colon Flattery RCA to Prox RCA stent is 10% stenosed. Prox RCA to Mid RCA stent is 5% stenosed. Prox RCA lesion is 95% stenosed, between the 2 old stents A drug-eluting stent was successfully placed using a STENT RESOLUTE ONYX 4.0X18 -> postdilated to 4.6-4.7 mm Post intervention, there is a 0% residual stenosis. ---------------- Mid RCA lesion is 85% stenosed after the second stent A drug-eluting stent was successfully placed using a STENT RESOLUTE ONYX 3.0X18 -> tapered post dilation from 4.2-3.3 mm Post intervention, there is a 0% residual stenosis.   SUMMARY Multivessel CAD with severe (95%) IntraStent stenosis in the proximal RCA and de novo stenosis distal to mid RCA stent (85%) -> both treated with DES PCI using Resolute Onyx DES stents (distal-3.0 mm x 18 mm with tapered post dilation 4.2-3.3 mm; proximal 4.0 mm 18 mm postdilated to 4.7 mm.) Diffuse mild to moderate calcified disease in the distal Left Main as well as ostial and proximal LCx and LAD with no severe stenoses. Borderline elevated LVEDP with severe systemic hypertension     RECOMMENDATIONS Return to nursing unit for ongoing care.  Okay to DC  heparin. We will need to determine plus or minus the use of Xarelto plus Plavix.  For now would continue aspirin plus Plavix until discharge and then  discontinue aspirin on discharge with Xarelto plus Plavix for 3-6 months. Continue to monitor anemia and other risk factors.   2D Echo 02/15/20  1. Technically difficult echo with poor image quality.   2. Left ventricular ejection fraction, by estimation, is 65 to 70%. The  left ventricle has normal function. The left ventricle has no regional  wall motion abnormalities. Left ventricular diastolic parameters are  consistent with Grade I diastolic  dysfunction (impaired relaxation).   3. Right ventricular systolic function was not well visualized. The right  ventricular size is not well visualized.   4. The mitral valve was not well visualized. No evidence of mitral valve  regurgitation.   5. The aortic valve was not well visualized. Aortic valve regurgitation  is not visualized.    Cath Q000111Q LV end diastolic pressure is mildly elevated. ------------------------------ Mid LM to Prox LAD lesion is 30% stenosed with 30% stenosed side branch in Ost Cx. Prox LAD to Mid LAD lesion is 45% stenosed with 25% stenosed side branch in 1st Diag. 1st Mrg lesion is 100% stenosed. ---------------- Colon Flattery RCA to Prox RCA stent is 10% stenosed. Prox RCA to Mid RCA stent is 5% stenosed. Prox RCA lesion is 95% stenosed, between the 2 old stents A drug-eluting stent was successfully placed using a STENT RESOLUTE ONYX 4.0X18 -> postdilated to 4.6-4.7 mm Post intervention, there is a 0% residual stenosis. ---------------- Mid RCA lesion is 85% stenosed after the second stent A drug-eluting stent was successfully placed using a STENT RESOLUTE ONYX 3.0X18 -> tapered post dilation from 4.2-3.3 mm Post intervention, there is a 0% residual stenosis.   SUMMARY Multivessel CAD with severe (95%) IntraStent stenosis in the proximal RCA and de novo stenosis distal to mid RCA stent (85%) -> both treated with DES PCI using Resolute Onyx DES stents (distal-3.0 mm x 18 mm with tapered post dilation 4.2-3.3 mm;  proximal 4.0 mm 18 mm postdilated to 4.7 mm.) Diffuse mild to moderate calcified disease in the distal Left Main as well as ostial and proximal LCx and LAD with no severe stenoses. Borderline elevated LVEDP with severe systemic hypertension     RECOMMENDATIONS Return to nursing unit for ongoing care.  Okay to DC heparin. We will need to determine plus or minus the use of Xarelto plus Plavix.  For now would continue aspirin plus Plavix until discharge and then discontinue aspirin on discharge with Xarelto plus Plavix for 3-6 months. Continue to monitor anemia and other risk factors.  EKG:  EKG is personally reviewed.  02/08/2022: EKG was not ordered. 11/21/2021: Normal sinus rhythm. Rate 68 bpm 05/18/21: NSR, inferior q waves, HR75  Recent Labs: 07/05/2021: TSH 1.194 03/26/2022: B Natriuretic Peptide 132.6 03/27/2022: ALT 10; Magnesium 1.2 05/05/2022: BUN 46; Creat 1.53; Hemoglobin 8.5; Platelets 266; Potassium 4.5; Sodium 137  Recent Lipid Panel    Component Value Date/Time   CHOL 97 01/26/2021 1405   CHOL 136 04/20/2015 1001   TRIG 109 01/26/2021 1405   HDL 36 (L) 01/26/2021 1405   HDL 40 04/20/2015 1001   CHOLHDL 2.7 01/26/2021 1405   VLDL 17 02/15/2020 0636   LDLCALC 42 01/26/2021 1405     Risk Assessment/Calculations:           Physical Exam:    VS:  There were no vitals taken for this visit.  Wt Readings from Last 3 Encounters:  06/05/22 290 lb (131.5 kg)  06/04/22 290 lb (131.5 kg)  05/15/22 290 lb 3.2 oz (131.6 kg)     GEN:  Uncomfortable appearing, wheelchair bound HEENT: Normal NECK: No JVD; No carotid bruits CARDIAC: RRR, no murmurs, rubs, gallops RESPIRATORY: CTAB, tachypneic ABDOMEN: Soft, non-tender, non-distended MUSCULOSKELETAL:  Warm, no edema SKIN: Warm and dry NEUROLOGIC:  Alert and oriented x 3 PSYCHIATRIC:  Normal affect   ASSESSMENT:    No diagnosis found.     PLAN:    In order of problems listed above:  #CAD with Prior NSTEMI  02/2020 s/p PCI to RCA: Had BMS to RCA 2004, DESx2 to mid and proximal RCA 2012, recent NSTEMI 02/2020 with ISR in [place] and mRCA s/p DES. Currently, denies chest pain. -Off DAPT due to need for [facility] -Continue lipitor '80mg'$  daily -Continue coreg 12.'5mg'$  BID -Nitro prn  #Chronic Diastolic HF: Currently appears euvolemic. Minimally mobile at this time and is mainly wheelchair bound.  -Continue torsemide '20mg'$  daily with additional dose as needed -Continue coreg 12.'5mg'$  BID -Low Na diet  #Recurrent DVT/PE Had recurrent DVT in 03/2022 with V/Q scan suggestive of bilateral PE now on apixaban. -Continue apixaban '5mg'$  BID  #HLD: -Continue lipitor '80mg'$  daily -Check lipids  #HTN: Fairly well controlled, mainly 120-130s at home. -Continue coreg 12.'5mg'$  BID  #DMII on Insulin: A1C 8.5. -Management per PCP -Goal A1C <7  #Anemia:  Denies bleeding. HgB stable at 8.8. -Management per PCP  Follow Up: Recommend to present to the ED.She declined to go to [facility] by EMS. Daughter will drive her to [place].  Medication Adjustments/Labs and Tests Ordered: Current medicines are reviewed at length with the patient today.  Concerns regarding medicines are outlined above.   No orders of the defined types were placed in this encounter.  No orders of the defined types were placed in this encounter.   There are no Patient Instructions on file for this visit.   I,Mathew Stumpf,acting as a Education administrator for Freada Bergeron, MD.,have documented all relevant documentation on the behalf of Freada Bergeron, MD,as directed by  Freada Bergeron, MD while in the presence of Freada Bergeron, MD.  I, Freada Bergeron, MD, have reviewed all documentation for this visit. The documentation on 06/10/22 for the exam, diagnosis, procedures, and orders are all accurate and complete.   Signed, Freada Bergeron, MD  06/10/2022 10:00 PM    [place]

## 2022-06-12 ENCOUNTER — Ambulatory Visit: Payer: PPO | Admitting: Cardiology

## 2022-06-13 ENCOUNTER — Ambulatory Visit (INDEPENDENT_AMBULATORY_CARE_PROVIDER_SITE_OTHER): Payer: PPO | Admitting: Family Medicine

## 2022-06-13 ENCOUNTER — Encounter: Payer: Self-pay | Admitting: Family Medicine

## 2022-06-13 VITALS — BP 136/84 | HR 79 | Temp 97.6°F | Wt 295.6 lb

## 2022-06-13 DIAGNOSIS — Z741 Need for assistance with personal care: Secondary | ICD-10-CM | POA: Diagnosis not present

## 2022-06-13 DIAGNOSIS — M25552 Pain in left hip: Secondary | ICD-10-CM

## 2022-06-13 DIAGNOSIS — G4700 Insomnia, unspecified: Secondary | ICD-10-CM

## 2022-06-13 DIAGNOSIS — R42 Dizziness and giddiness: Secondary | ICD-10-CM

## 2022-06-13 DIAGNOSIS — R519 Headache, unspecified: Secondary | ICD-10-CM | POA: Diagnosis not present

## 2022-06-13 DIAGNOSIS — G8929 Other chronic pain: Secondary | ICD-10-CM | POA: Diagnosis not present

## 2022-06-13 DIAGNOSIS — H6592 Unspecified nonsuppurative otitis media, left ear: Secondary | ICD-10-CM

## 2022-06-13 MED ORDER — MECLIZINE HCL 25 MG PO TABS: 25.0000 mg | ORAL_TABLET | Freq: Two times a day (BID) | ORAL | 0 refills | Status: DC | PRN

## 2022-06-13 MED ORDER — AMOXICILLIN 500 MG PO CAPS: 500.0000 mg | ORAL_CAPSULE | Freq: Three times a day (TID) | ORAL | 0 refills | Status: AC

## 2022-06-13 MED ORDER — OXYCODONE-ACETAMINOPHEN 10-325 MG PO TABS: 1.0000 | ORAL_TABLET | Freq: Three times a day (TID) | ORAL | 0 refills | Status: DC | PRN

## 2022-06-13 NOTE — Assessment & Plan Note (Signed)
[name] report of vertigo and ear fullness suggests a possible recurrence of vertigo and otitis media with effusion.  Plan:  Prescribe Meclizine for symptomatic relief of vertigo as needed. Start a course of amoxicillin for potential ear infection to address the fluid buildup and associated pain. Consider ENT referral if symptoms persist after antibiotic treatment.

## 2022-06-13 NOTE — Progress Notes (Signed)
Assessment/Plan:   Problem List Items Addressed This Visit       Other   Insomnia (Chronic)   Vertigo - Primary    [name] report of vertigo and ear fullness suggests a possible recurrence of vertigo and otitis media with effusion.  Plan:  Prescribe Meclizine for symptomatic relief of vertigo as needed. Start a course of amoxicillin for potential ear infection to address the fluid buildup and associated pain. Consider ENT referral if symptoms persist after antibiotic treatment.      Relevant Medications   meclizine (ANTIVERT) 25 MG tablet   Chronic left hip pain    The patient presents with exacerbated chronic hip pain following an ER visit, with imaging revealing chronic degenerative changes and fluid collection suggesting possible trochanteric bursitis.  Differential diagnosis:  Chronic osteoarthritis Trochanteric bursitis Lumbar disc disease  Plan:  Increase prescription for oxycodone to account for the increased pain following the ER visit and provide enough dosage to match patient's self-escalated equivalent dose for pain management. Schedule a follow-up appointment with pain management to reassess pain levels and medication needs. Consider physical therapy for long-term pain management and mobility improvement once pain is better controlled.      Relevant Medications   oxyCODONE-acetaminophen (PERCOCET) 10-325 MG tablet   Dependent for medication administration    The patient expresses concern about medication administration by a family member, notably withholding certain prescriptions.  Plan:  Discuss the importance of taking prescribed medications as directed with both patient and family members present. Ensure the family understands the potential risks associated with non-compliance. Encourage patient autonomy and stress the need to follow the prescribed medication regimen. Monitor the situation and if the issue persists, consider a family meeting to  address concerns and seek solutions.      Other Visit Diagnoses     Fluid level behind tympanic membrane of left ear       Relevant Medications   amoxicillin (AMOXIL) 500 MG capsule   Nonintractable headache, unspecified chronicity pattern, unspecified headache type       Relevant Medications   meclizine (ANTIVERT) 25 MG tablet   oxyCODONE-acetaminophen (PERCOCET) 10-325 MG tablet       Medications Discontinued During This Encounter  Medication Reason   BELSOMRA 5 MG TABS    meclizine (ANTIVERT) 25 MG tablet Reorder      Subjective:  HPI: Encounter date: 06/13/2022  [name] is a 88 y.o. female who has Hyperlipidemia; Class 3 obesity ([place]); Anemia; TENSION HEADACHE; Essential hypertension; Chronic diastolic CHF (congestive heart failure) ([place]); Long term current use of anticoagulant therapy; Insomnia; Atypical chest pain; Estrogen deficiency; COPD with chronic bronchitis; CN (constipation); Vertigo; Gout; Acute kidney injury superimposed on chronic kidney disease ([place]); Vitamin D deficiency; Endometrial polyp; GERD with esophagitis; Dementia ([place]); Type 2 diabetes mellitus with hyperlipidemia ([place]); Moderate nonproliferative diabetic retinopathy of both eyes without macular edema associated with type 2 diabetes mellitus ([place]); Chronic radicular lumbar pain; Lumbar spondylosis; Spinal stenosis, lumbar region, with neurogenic claudication; Bilateral primary osteoarthritis of knee; Iron deficiency anemia; Right elbow pain; Chronic pain of right hand; Breast cancer ([place]); Anxiety; Acute on chronic diastolic (congestive) heart failure ([place]); AKI (acute kidney injury) ([place]); Severe episode of recurrent major depressive disorder, without psychotic features ([place]); Pneumobilia; Dermatosis papulosa nigra; Positive D dimer; CKD (chronic kidney disease), stage IV ([place]); Type 2 diabetes mellitus with stage 4 chronic kidney disease, with long-term current use of insulin ([place]); Type 2 diabetes  mellitus with diabetic neuropathy, with long-term  current use of insulin ([place]); DM2 (diabetes mellitus, type 2) ([place]); Acute upper GI bleed; Black stool; Acute deep vein thrombosis (DVT) of femoral vein of right lower extremity ([place]); Stage 3b chronic kidney disease (CKD) ([place]); COPD (chronic obstructive pulmonary disease) ([place]); History of anemia due to chronic kidney disease; GAD (generalized anxiety disorder); Allergic rhinitis; GI bleed; Diabetic peripheral neuropathy associated with type 2 diabetes mellitus ([place]); Hypoglycemia; Swelling of right upper extremity; Carpal tunnel syndrome on both sides; Very limited skin mobility; Chronic left hip pain; and Dependent for medication administration on their problem list..   She  has a past medical history of Abdominal pain (01/26/2012), Acute kidney injury superimposed on chronic kidney disease ([place]) (12/18/2014), Allergy, Anemia, unspecified, Anxiety, Anxiety associated with depression (06/08/2017), Arthritis, Breast cancer ([place]), Breast cancer screening (02/19/2014), Breast mass (10/27/2014), Chronic diastolic CHF (congestive heart failure) ([place]), Chronic pain syndrome (02/06/2015), CKD (chronic kidney disease), stage III ([place]), Coronary artery disease, Debility (08/01/2012), Dementia ([place]), Depression, Diabetes mellitus, DOE (dyspnea on exertion) (06/24/2014), Dysphagia, unspecified(787.20) (07/31/2012), Fungal dermatitis (11/13/2007), GERD (gastroesophageal reflux disease), Gout, unspecified, Headache, History of blood clots, History of deep venous thrombosis (01/27/2012), History of pulmonary embolus (PE) (04/22/2014), Hyperkalemia (07/26/2017), Hyperlipidemia, Hypertension, Insomnia, Joint pain, Joint swelling, Morbid obesity ([place]), Multiple benign nevi (11/15/2016), Myocardial infarction ([place]) (2004), Non-STEMI (non-ST elevated myocardial infarction) ([place]) (02/15/2020), Numbness, Obstructive sleep apnea, Osteoarthritis, Osteoarthritis, Osteoarthrosis,  unspecified whether generalized or localized, lower leg, Pain in the chest (12/31/2013), Pain, chronic, Personal history of other diseases of the digestive system (12/03/2006), Polymyalgia rheumatica ([place]), Presence of stent in right coronary artery, Pulmonary emboli ([place]) (01/2012), Situational depression (06/04/2009), Syncope, vasovagal (07/04/2021), Type 2 diabetes mellitus with hyperlipidemia ([place]) (02/16/2022), Urinary incontinence, Vaginitis and vulvovaginitis (06/23/2016), Vertigo, and Wears dentures..   She presents with chief complaint of Follow-up (Left sided itch on neck and arms x 2 months. ED follow up. Patient complains of headache with bright light. ) .  Patient is present with daughter and her son who provide history.  Patient gets care from both of her daughters alternating between each 1.  CHIEF COMPLAINT: The patient presents with concerns about pain management following an ER visit, ongoing hip pain, intermittent vertigo, and issues with medication administration at home.  HISTORY OF PRESENT ILLNESS:  Problem 1: Hip Pain and Management Post-ER Visit : The patient reports visiting the Emergency Department (ED) on the Jan 28th for acute on chronic left hip pain and has been experiencing pain and dizziness since then. The patient describes the pain as significantly impacting her hip and mentions ongoing issues with vertigo. Imaging conducted in the ED revealed chronic changes consistent with lower back degenerative disease without acute changes and evidence of a trochanteric bursa with fluid collection. There was no new acute injury found. The patient was supposed to receive a prescription for oxycodone at the ED, but there was a miscommunication, and the patient has been managing with a previously available dose of oxycodone 7.5 mg and has self-escalated to an equivalent of 10 mg for better pain control, which has been somewhat effective.  Problem 2: Vertigo The patient has a history of  vertigo, which seems to have recurred in the past week and a half. This vertigo is particularly notable on the left side, with reports of ear fullness and dizziness. The patient has also been experiencing headaches that coincide with the vertigo but has no current headache.  Ongoing Issues with Medication Administration:  Patient expresses concern over the her other daughter (not the  1 currently at today's visit) withholding certain medications due to her beliefs and recommendations made by the patient's son. Medications perceived as "pain and joy and anxiety" pills, specifically trazodone, are being withheld which the patient believes is impeding their recovery.  Current daughter, ensures that she is giving her mother all of her medications as prescribed and states that her sister is not withholding other medications such as Eliquis, Lipitor, carvedilol, etc. that are perceived as part of "routine" care.  REVIEW OF SYSTEMS: No reports of fever, weight loss, cough, or shortness of breath. The patient also reports normal blood sugar readings, with most readings greater than 100. Past Surgical History:  Procedure Laterality Date   ABDOMINAL HYSTERECTOMY     partial   APPENDECTOMY     blood clots/legs and lungs  2013   BREAST BIOPSY Left 07/22/2014   BREAST BIOPSY Left 02/10/2013   BREAST LUMPECTOMY Left 11/05/2014   BREAST LUMPECTOMY WITH RADIOACTIVE SEED LOCALIZATION Left 11/05/2014   Procedure: LEFT BREAST LUMPECTOMY WITH RADIOACTIVE SEED LOCALIZATION;  Surgeon: [name], MD;  Location: [facility];  Service: General;  Laterality: Left;   CARDIAC CATHETERIZATION     COLONOSCOPY     CORONARY ANGIOPLASTY  2   CORONARY STENT INTERVENTION N/A 02/17/2020   Procedure: CORONARY STENT INTERVENTION;  Surgeon: Leonie Man, MD;  Location: [facility];  Service: Cardiovascular;  Laterality: N/A;   ESOPHAGOGASTRODUODENOSCOPY (EGD) WITH PROPOFOL N/A 11/07/2016   Procedure:  ESOPHAGOGASTRODUODENOSCOPY (EGD) WITH PROPOFOL;  Surgeon: Gatha Mayer, MD;  Location: [facility];  Service: Endoscopy;  Laterality: N/A;   EXCISION OF SKIN TAG Right 11/05/2014   Procedure: EXCISION OF RIGHT EYELID SKIN TAG;  Surgeon: [name], MD;  Location: [facility];  Service: General;  Laterality: Right;   EYE SURGERY Bilateral    cataract    GASTRIC BYPASS  1977    reversed in 1979, [place] CATH AND CORONARY ANGIOGRAPHY N/A 02/17/2020   Procedure: LEFT HEART CATH AND CORONARY ANGIOGRAPHY;  Surgeon: Leonie Man, MD;  Location: [facility];  Service: Cardiovascular;  Laterality: N/A;   LEFT HEART CATHETERIZATION WITH CORONARY ANGIOGRAM N/A 06/29/2014   Procedure: LEFT HEART CATHETERIZATION WITH CORONARY ANGIOGRAM;  Surgeon: Troy Sine, MD;  Location: [facility];  Service: Cardiovascular;  Laterality: N/A;   MEMBRANE PEEL Right 10/23/2018   Procedure: MEMBRANE PEEL;  Surgeon: Hurman Horn, MD;  Location: [facility];  Service: Ophthalmology;  Laterality: Right;   MI with stent placement  2004   PARS PLANA VITRECTOMY Right 10/23/2018   Procedure: PARS PLANA VITRECTOMY WITH 25 GAUGE;  Surgeon: Hurman Horn, MD;  Location: [facility];  Service: Ophthalmology;  Laterality: Right;    Outpatient Medications Prior to Visit  Medication Sig Dispense Refill   albuterol (PROAIR HFA) 108 (90 Base) MCG/ACT inhaler Inhale 1-2 puffs into the lungs every 6 (six) hours as needed for wheezing or shortness of breath. 6.7 g 1   anastrozole (ARIMIDEX) 1 MG tablet TAKE 1 TABLET BY MOUTH EVERY DAY 28 tablet 1   apixaban (ELIQUIS) 5 MG TABS tablet Take 1 tablet (5 mg total) by mouth 2 (two) times daily. 180 tablet 3   atorvastatin (LIPITOR) 80 MG tablet TAKE 1 TABLET BY MOUTH EVERY DAY 90 tablet 3   carvedilol (COREG) 12.5 MG tablet TAKE 1 TABLET BY MOUTH 2 TIMES DAILY 180 tablet 1   cetirizine (ZYRTEC) 10 MG tablet Take 10 mg by mouth daily  as needed for allergies or rhinitis.      Continuous Blood Gluc Sensor (FREESTYLE LIBRE 14 DAY SENSOR) MISC PLACE 1 DEVICE ON THE SKIN AS DIRECTED EVERY 14 DAYS 6 each 2   diclofenac Sodium (VOLTAREN) 1 % GEL Apply 2 g topically 4 (four) times daily. (Patient taking differently: Apply 2 g topically 4 (four) times daily as needed (pain).) 150 g 2   DULoxetine (CYMBALTA) 30 MG capsule TAKE 1 CAPSULE BY MOUTH EVERY DAY 90 capsule 0   Elastic Bandages & Supports (B & B CARPAL TUNNEL BRACE) MISC Use brace on wrist as needed for wrist pain 2 each 10   febuxostat (ULORIC) 40 MG tablet Take 1 tablet (40 mg total) by mouth daily. 90 tablet 3   fluticasone (FLONASE) 50 MCG/ACT nasal spray Place 1 spray into both nostrils in the morning and at bedtime. (Patient taking differently: Place 1 spray into both nostrils 2 (two) times daily as needed for rhinitis or allergies.) 02.4 mL 3   folic acid (FOLVITE) 1 MG tablet TAKE 1 TABLET BY MOUTH EVERY DAY 90 tablet 1   glucagon 1 MG injection Inject 1 mg into the vein once as needed for up to 1 dose. 1 each 12   glucose 4 GM chewable tablet Chew 1 tablet (4 g total) by mouth as needed for low blood sugar. 50 tablet 12   guaifenesin (ROBITUSSIN) 100 MG/5ML syrup Take 200 mg by mouth 3 (three) times daily as needed for cough.     insulin glargine, 2 Unit Dial, (TOUJEO MAX SOLOSTAR) 300 UNIT/ML Solostar Pen Inject 40 Units into the skin daily. Patient assistance program provides. Patient reports 40 units 04/24/2022 12 mL 1   linaclotide (LINZESS) 145 MCG CAPS capsule Take 1 capsule (145 mcg total) by mouth daily before breakfast. 30 capsule 2   memantine (NAMENDA) 5 MG tablet TAKE 2 TABLETS BY MOUTH EVERY DAY and TAKE 1 TABLET BY MOUTH EVERY EVENING (Patient taking differently: Take 5-10 mg by mouth See admin instructions. TAKE 2 TABLETS BY MOUTH EVERY DAY and TAKE 1 TABLET BY MOUTH EVERY EVENING) 270 tablet 5   montelukast (SINGULAIR) 10 MG tablet Take 1 tablet (10 mg total) by mouth at bedtime. 30 tablet 3    nitroGLYCERIN (NITROSTAT) 0.4 MG SL tablet Take one tablet under the tongue every 5 minutes as needed for chest pain 50 tablet 0   NOVOLOG FLEXPEN 100 UNIT/ML FlexPen Inject 14 Units into the skin 2 (two) times daily with a meal. Max daily dose 70 units 60 mL 0   nystatin cream (MYCOSTATIN) Apply 1 Application topically 2 (two) times daily. 30 g 3   ondansetron (ZOFRAN) 4 MG tablet Take 1 tablet (4 mg total) by mouth every 8 (eight) hours as needed for nausea or vomiting. 20 tablet 0   oxyCODONE-acetaminophen (PERCOCET) 7.5-325 MG tablet Take 1 tablet by mouth every 12 (twelve) hours. 60 tablet 0   pantoprazole (PROTONIX) 20 MG tablet TAKE 1 TABLET BY MOUTH EVERY DAY 30 tablet 5   sodium polystyrene (SPS) 15 GM/60ML suspension TAKE 60 MLS BY MOUTH ONCE WEEKLY TO KEEP POTASSIUM DOWN 240 mL 0   SYMBICORT 160-4.5 MCG/ACT inhaler Inhale 2 puffs by mouth twice daily 11 g 0   torsemide (DEMADEX) 20 MG tablet TAKE 1 TABLET BY MOUTH EVERY DAY Take an extra dose for weight gain, 3lb in 1 day or 5lbs in 1 week 30 tablet 0   Vitamin D, Ergocalciferol, (DRISDOL) 1.25 MG (50000 UNIT) CAPS  capsule TAKE 1 CAPSULE BY MOUTH every 7 days 4 capsule 1   BELSOMRA 5 MG TABS TAKE 1 TABLET BY MOUTH AT BEDTIME AS NEEDED FOR INSOMNIA 30 tablet 0   meclizine (ANTIVERT) 25 MG tablet TAKE 1 TABLET BY MOUTH THREE TIMES DAILY AS NEEDED FOR DIZZINESS 30 tablet 11   No facility-administered medications prior to visit.    Family History  Problem Relation Age of Onset   Breast cancer Mother 22   Heart disease Mother    Throat cancer Father    Hypertension Father    Arthritis Father    Diabetes Father    Arthritis Sister    Obesity Sister    Diabetes Sister    Heart disease Cousin    Colon cancer Neg Hx    Stomach cancer Neg Hx    Esophageal cancer Neg Hx     Social History   Socioeconomic History   Marital status: Widowed    Spouse name: Not on file   Number of children: 3   Years of education: Not on file    Highest education level: High school graduate  Occupational History   Occupation: retired  Tobacco Use   Smoking status: Never    Passive exposure: Past   Smokeless tobacco: Never   Tobacco comments:    Both parents smoked, patient was exposed/ "Raised up in smoke, my whole life."  Vaping Use   Vaping Use: Never used  Substance and Sexual Activity   Alcohol use: No    Alcohol/week: 0.0 standard drinks of alcohol   Drug use: No   Sexual activity: Not Currently  Other Topics Concern   Not on file  Social History Narrative   Not on file   Social Determinants of Health   Financial Resource Strain: Low Risk  (06/05/2022)   Overall Financial Resource Strain (CARDIA)    Difficulty of Paying Living Expenses: Not hard at all  Food Insecurity: No Food Insecurity (06/05/2022)   Hunger Vital Sign    Worried About Running Out of Food in the Last Year: Never true    Ran Out of Food in the Last Year: Never true  Transportation Needs: No Transportation Needs (06/05/2022)   PRAPARE - Hydrologist (Medical): No    Lack of Transportation (Non-Medical): No  Physical Activity: Insufficiently Active (06/05/2022)   Exercise Vital Sign    Days of Exercise per Week: 3 days    Minutes of Exercise per Session: 10 min  Stress: Stress Concern Present (06/05/2022)   [place]    Feeling of Stress : Rather much  Social Connections: Moderately Isolated (04/20/2022)   Social Connection and Isolation Panel [NHANES]    Frequency of Communication with Friends and Family: Once a week    Frequency of Social Gatherings with Friends and Family: Once a week    Attends Religious Services: 1 to 4 times per year    Active Member of [employer] or Organizations: Yes    Attends Archivist Meetings: 1 to 4 times per year    Marital Status: Widowed  Intimate Partner Violence: Not At Risk (04/20/2022)   Humiliation,  Afraid, Rape, and Kick questionnaire    Fear of Current or Ex-Partner: No    Emotionally Abused: No    Physically Abused: No    Sexually Abused: No  Objective:  Physical Exam: BP 136/84 (BP Location: Left Arm, Patient Position: Sitting, Cuff Size: Large)   Pulse 79   Temp 97.6 F (36.4 C) (Temporal)   Wt 295 lb 9.6 oz (134.1 kg)   SpO2 98%   BMI 46.30 kg/m    General: No acute distress. Awake and conversant.  Wheelchair-bound Eyes: Normal conjunctiva, anicteric. Round symmetric pupils.  ENT: Hearing grossly intact. No nasal discharge.  Otic canals clear without tenderness, left effusion behind tympanic membrane, right tympanic membrane normal neck: Neck is supple. No masses or thyromegaly.  Respiratory: Respirations are non-labored. No auditory wheezing.  Skin: Warm. No rashes or ulcers.  Psych: Alert and oriented. Cooperative, Appropriate mood and affect, Normal judgment.  CV: No cyanosis or JVD MSK: Left hip tenderness, mild normal ambulation. No clubbing  Neuro: Sensation and CN II-XII grossly normal.        Alesia Banda, MD, MS

## 2022-06-13 NOTE — Patient Instructions (Signed)
For ear infection, take amoxicillin. For insomnia, you may take trazodone as needed. For vertigo, you may take meclizine as needed. For hip pain, I have prescribed a short course of oxycodone 10 mg/325 to take as needed until you can see pain management.

## 2022-06-13 NOTE — Assessment & Plan Note (Signed)
The patient expresses concern about medication administration by a family member, notably withholding certain prescriptions.  Plan:  Discuss the importance of taking prescribed medications as directed with both patient and family members present. Ensure the family understands the potential risks associated with non-compliance. Encourage patient autonomy and stress the need to follow the prescribed medication regimen. Monitor the situation and if the issue persists, consider a family meeting to address concerns and seek solutions.

## 2022-06-13 NOTE — Assessment & Plan Note (Signed)
The patient presents with exacerbated chronic hip pain following an ER visit, with imaging revealing chronic degenerative changes and fluid collection suggesting possible trochanteric bursitis.  Differential diagnosis:  Chronic osteoarthritis Trochanteric bursitis Lumbar disc disease  Plan:  Increase prescription for oxycodone to account for the increased pain following the ER visit and provide enough dosage to match patient's self-escalated equivalent dose for pain management. Schedule a follow-up appointment with pain management to reassess pain levels and medication needs. Consider physical therapy for long-term pain management and mobility improvement once pain is better controlled.

## 2022-06-14 DIAGNOSIS — G5602 Carpal tunnel syndrome, left upper limb: Secondary | ICD-10-CM | POA: Diagnosis not present

## 2022-06-15 ENCOUNTER — Ambulatory Visit
Payer: PPO | Attending: Student in an Organized Health Care Education/Training Program | Admitting: Student in an Organized Health Care Education/Training Program

## 2022-06-15 ENCOUNTER — Encounter: Payer: Self-pay | Admitting: Student in an Organized Health Care Education/Training Program

## 2022-06-15 VITALS — BP 153/60 | HR 68 | Temp 97.3°F | Resp 16 | Ht 67.0 in | Wt 295.0 lb

## 2022-06-15 DIAGNOSIS — M170 Bilateral primary osteoarthritis of knee: Secondary | ICD-10-CM | POA: Diagnosis not present

## 2022-06-15 DIAGNOSIS — M79641 Pain in right hand: Secondary | ICD-10-CM

## 2022-06-15 DIAGNOSIS — G8929 Other chronic pain: Secondary | ICD-10-CM | POA: Diagnosis not present

## 2022-06-15 DIAGNOSIS — M48062 Spinal stenosis, lumbar region with neurogenic claudication: Secondary | ICD-10-CM

## 2022-06-15 DIAGNOSIS — M25521 Pain in right elbow: Secondary | ICD-10-CM

## 2022-06-15 DIAGNOSIS — M47816 Spondylosis without myelopathy or radiculopathy, lumbar region: Secondary | ICD-10-CM | POA: Diagnosis not present

## 2022-06-15 DIAGNOSIS — G894 Chronic pain syndrome: Secondary | ICD-10-CM | POA: Diagnosis not present

## 2022-06-15 MED ORDER — OXYCODONE-ACETAMINOPHEN 7.5-325 MG PO TABS: 1.0000 | ORAL_TABLET | Freq: Two times a day (BID) | ORAL | 0 refills | Status: AC

## 2022-06-15 MED ORDER — OXYCODONE-ACETAMINOPHEN 7.5-325 MG PO TABS: 1.0000 | ORAL_TABLET | Freq: Two times a day (BID) | ORAL | 0 refills | Status: DC

## 2022-06-15 NOTE — Progress Notes (Signed)
PROVIDER NOTE: Information contained herein reflects review and annotations entered in association with encounter. Interpretation of such information and data should be left to medically-trained personnel. Information provided to patient can be located elsewhere in the medical record under "Patient Instructions". Document created using STT-dictation technology, any transcriptional errors that may result from process are unintentional.    Patient: [name]  Service Category: E/M  Provider: Gillis Santa, MD  DOB: Aug 16, 1934  DOS: 06/15/2022  Specialty: Interventional Pain Management  MRN: [id number]  Setting: Ambulatory outpatient  PCP: Bonnita Hollow, MD  Type: Established Patient    Referring Provider: Bonnita Hollow, MD  Location: Office  Delivery: Face-to-face     HPI  [name], a 88 y.o. year old female, is here today because of her Chronic pain of right hand [M79.641, G89.29]. [name] primary complain today is Shoulder Pain (Left ), Hip Pain (Left ), and Knee Pain (Bilateral )  Last encounter: My last encounter with her was on 03/21/22  Pertinent problems: [name] has Chronic radicular lumbar pain; Lumbar spondylosis; Spinal stenosis, lumbar region, with neurogenic claudication; and Bilateral primary osteoarthritis of knee on their pertinent problem list. Pain Assessment: Severity of Chronic pain is reported as a 8 /10. Location: Shoulder (see visit info for additional pain sites.) Left/sometimes shoulder pain goes down left arm and up into the neck.. Onset: More than a month ago. Quality: Discomfort, Constant, Aching. Timing: Constant. Modifying factor(s): pain medications. Vitals:  height is '5\' 7"'$  (1.702 m) and weight is 295 lb (133.8 kg). Her temporal temperature is 97.3 F (36.3 C) (abnormal). Her blood pressure is 153/60 (abnormal) and her pulse is 68. Her respiration is 16 and oxygen saturation is 100%.   Reason for encounter: medication management.    Unfortunately, patient went to the emergency department on 06/04/2022 for a fall and associated left hip pain.  She was getting out of bed lifting her leg up and had severe pain in her left hip.  CT scan of her lumbar spine and CT pelvis showed multilevel spondylosis specifically at L2-L3 along with a fluid collection along the greater trochanter laterally.  She states that she is doing somewhat better.  She is accompanied today by her sister.  She did receive a small quantity of oxycodone at the emergency department.    Pharmacotherapy Assessment  Analgesic:Percocet 7.5 mg twice daily as needed, quantity 60/month  Monitoring: [place] PMP: PDMP reviewed during this encounter.       Pharmacotherapy: No side-effects or adverse reactions reported. Compliance: No problems identified. Effectiveness: Clinically acceptable.  UDS:  Summary  Date Value Ref Range Status  03/21/2022 Note  Final    Comment:    ==================================================================== [employer] [facility] (MW) ==================================================================== Test                             Result       Flag       Units  Drug Present and Declared for Prescription Verification   Oxycodone                      59           EXPECTED   ng/mg creat   Oxymorphone                    316          EXPECTED   ng/mg creat  Noroxycodone                   99           EXPECTED   ng/mg creat    Sources of oxycodone include scheduled prescription medications.    Oxymorphone and noroxycodone are expected metabolites of oxycodone.    Oxymorphone is also available as a scheduled prescription medication.  Drug Absent but Declared for Prescription Verification   Alprazolam                     Not Detected UNEXPECTED ng/mg creat ==================================================================== Test                      Result    Flag   Units      Ref Range   Creatinine              103              mg/dL       >=20 ==================================================================== Declared Medications:  The flagging and interpretation on this report are based on the  following declared medications.  Unexpected results may arise from  inaccuracies in the declared medications.   **Note: The testing scope of this panel includes these medications:   Alprazolam (Xanax)  Oxycodone (Percocet)   **Note: The testing scope of this panel does not include the  following reported medications:   Acetaminophen (Percocet)  Albuterol (Ventolin HFA)  Anastrozole (Arimidex)  Apixaban (Eliquis)  Atorvastatin (Lipitor)  Budesonide (Symbicort)  Carvedilol (Coreg)  Cetirizine (Zyrtec)  Clopidogrel (Plavix)  Diclofenac (Voltaren)  Docusate (Stool Softener)  Escitalopram (Lexapro)  Febuxostat (Uloric)  Fluticasone (Flonase)  Folic Acid  Formoterol (Symbicort)  Guaifenesin (Robitussin)  Insulin (NovoLog)  Linaclotide (Linzess)  Meclizine (Antivert)  Memantine (Namenda)  Nitroglycerin (Nitrostat)  Nystatin (Mycostatin)  Ondansetron (Zofran)  Pantoprazole (Protonix)  Sodium Polystyrene Sulfonate (Kayexalate)  Torsemide (Demadex)  Trazodone (Desyrel)  Vitamin D2 (Drisdol) ==================================================================== For clinical consultation, please call [phone]. ====================================================================       ROS  Constitutional: Denies any fever or chills Gastrointestinal: No reported hemesis, hematochezia, vomiting, or acute GI distress Musculoskeletal:  left shoulder pain, left hip pain low back pain Neurological: No reported episodes of acute onset apraxia, aphasia, dysarthria, agnosia, amnesia, paralysis, loss of coordination, or loss of consciousness  Medication Review  B & B Carpal Tunnel Brace, DULoxetine, FreeStyle Libre 14 Day Sensor, Vitamin D (Ergocalciferol), albuterol, amoxicillin, anastrozole, apixaban,  atorvastatin, budesonide-formoterol, carvedilol, cetirizine, diclofenac Sodium, febuxostat, fluticasone, folic acid, glucagon, glucose, guaifenesin, insulin aspart, insulin glargine (2 Unit Dial), linaclotide, meclizine, memantine, montelukast, nitroGLYCERIN, nystatin cream, ondansetron, oxyCODONE-acetaminophen, pantoprazole, sodium polystyrene, and torsemide  History Review  Allergy: Ms. Bouldin is allergic to sulfonamide derivatives, lokelma [sodium zirconium cyclosilicate], aricept [donepezil hcl], and tramadol. Drug: Ms. Borgen  reports no history of drug use. Alcohol:  reports no history of alcohol use. Tobacco:  reports that she has never smoked. She has been exposed to tobacco smoke. She has never used smokeless tobacco. Social: Ms. Mcnicholas  reports that she has never smoked. She has been exposed to tobacco smoke. She has never used smokeless tobacco. She reports that she does not drink alcohol and does not use drugs. Medical:  has a past medical history of Abdominal pain (01/26/2012), Acute kidney injury superimposed on chronic kidney disease ([place]) (12/18/2014), Allergy, Anemia, unspecified, Anxiety, Anxiety associated with depression (06/08/2017), Arthritis, Breast cancer ([place]), Breast cancer screening (02/19/2014), Breast mass (10/27/2014), Chronic  diastolic CHF (congestive heart failure) (HCC), Chronic pain syndrome (02/06/2015), CKD (chronic kidney disease), stage III ([place]), Coronary artery disease, Debility (08/01/2012), Dementia ([place]), Depression, Diabetes mellitus, DOE (dyspnea on exertion) (06/24/2014), Dysphagia, unspecified(787.20) (07/31/2012), Fungal dermatitis (11/13/2007), GERD (gastroesophageal reflux disease), Gout, unspecified, Headache, History of blood clots, History of deep venous thrombosis (01/27/2012), History of pulmonary embolus (PE) (04/22/2014), Hyperkalemia (07/26/2017), Hyperlipidemia, Hypertension, Insomnia, Joint pain, Joint swelling, Morbid obesity ([place]), Multiple benign  nevi (11/15/2016), Myocardial infarction ([place]) (2004), Non-STEMI (non-ST elevated myocardial infarction) ([place]) (02/15/2020), Numbness, Obstructive sleep apnea, Osteoarthritis, Osteoarthritis, Osteoarthrosis, unspecified whether generalized or localized, lower leg, Pain in the chest (12/31/2013), Pain, chronic, Personal history of other diseases of the digestive system (12/03/2006), Polymyalgia rheumatica ([place]), Presence of stent in right coronary artery, Pulmonary emboli ([place]) (01/2012), Situational depression (06/04/2009), Syncope, vasovagal (07/04/2021), Type 2 diabetes mellitus with hyperlipidemia ([place]) (02/16/2022), Urinary incontinence, Vaginitis and vulvovaginitis (06/23/2016), Vertigo, and Wears dentures. Surgical: Ms. Lungren  has a past surgical history that includes Appendectomy; Cardiac catheterization; Gastric bypass (1977 ); blood clots/legs and lungs (2013); MI with stent placement (2004); left heart catheterization with coronary angiogram (N/A, 06/29/2014); Coronary angioplasty (2); Eye surgery (Bilateral); Colonoscopy; Abdominal hysterectomy; Breast lumpectomy with radioactive seed localization (Left, 11/05/2014); Excision of skin tag (Right, 11/05/2014); Breast lumpectomy (Left, 11/05/2014); Breast biopsy (Left, 07/22/2014); Breast biopsy (Left, 02/10/2013); Esophagogastroduodenoscopy (egd) with propofol (N/A, 11/07/2016); Pars plana vitrectomy (Right, 10/23/2018); Membrane peel (Right, 10/23/2018); LEFT HEART CATH AND CORONARY ANGIOGRAPHY (N/A, 02/17/2020); and CORONARY STENT INTERVENTION (N/A, 02/17/2020). Family: family history includes Arthritis in her father and sister; Breast cancer (age of onset: 3) in her mother; Diabetes in her father and sister; Heart disease in her cousin and mother; Hypertension in her father; Obesity in her sister; Throat cancer in her father.  Laboratory Chemistry Profile   Renal Lab Results  Component Value Date   BUN 46 (H) 05/05/2022   CREATININE 1.53 (H)  05/05/2022   LABCREA 36 06/14/2017   BCR 30 (H) 05/05/2022   GFR 20.07 (L) 03/14/2022   GFRAA 38 (L) 03/02/2020   GFRNONAA 24 (L) 03/29/2022     Hepatic Lab Results  Component Value Date   AST 14 (L) 03/27/2022   ALT 10 03/27/2022   ALBUMIN 3.0 (L) 03/27/2022   ALKPHOS 81 03/27/2022   LIPASE 35 02/08/2022     Electrolytes Lab Results  Component Value Date   NA 137 05/05/2022   K 4.5 05/05/2022   CL 100 05/05/2022   CALCIUM 8.5 (L) 05/05/2022   MG 1.2 (L) 03/27/2022   PHOS 3.8 03/27/2022     Bone Lab Results  Component Value Date   VD25OH 64.31 09/09/2021     Inflammation (CRP: Acute Phase) (ESR: Chronic Phase) Lab Results  Component Value Date   CRP 1.0 (H) 01/30/2019   ESRSEDRATE 110 (H) 09/25/2014   LATICACIDVEN 1.2 02/08/2022       Note: Above Lab results reviewed.  Recent Imaging Review  CT Hip Left Wo Contrast CLINICAL DATA:  Left hip pain for 1 week, no known injury, initial encounter  EXAM: CT OF THE LEFT HIP WITHOUT CONTRAST  TECHNIQUE: Multidetector CT imaging of the left hip was performed according to the standard protocol. Multiplanar CT image reconstructions were also generated.  RADIATION DOSE REDUCTION: This exam was performed according to the departmental dose-optimization program which includes automated exposure control, adjustment of the mA and/or kV according to patient size and/or use of iterative reconstruction technique.  COMPARISON:  None Available.  FINDINGS: Bones/Joint/Cartilage  Generalized osteopenia is noted. Left hip is well seated in the acetabulum. No acute fracture is identified.  Ligaments  Suboptimally assessed by CT.  Muscles and Tendons  Surrounding musculature appears within normal limits.  Soft tissues  A small fluid collection is noted along the lateral aspect of the greater trochanter which may be related to recent injury. No definitive hematoma is seen. No other soft tissue abnormality  is noted.  IMPRESSION: No acute bony abnormality is noted.  Generalized osteopenia is seen.  Small fluid collection is noted along the lateral aspect of the greater trochanter which may be related to recent injury.  Electronically Signed   By: Inez Catalina M.D.   On: 06/04/2022 18:22 CT Lumbar Spine Wo Contrast CLINICAL DATA:  Left hip and leg pain for 1 week.  EXAM: CT LUMBAR SPINE WITHOUT CONTRAST  TECHNIQUE: Multidetector CT imaging of the lumbar spine was performed without intravenous contrast administration. Multiplanar CT image reconstructions were also generated.  RADIATION DOSE REDUCTION: This exam was performed according to the departmental dose-optimization program which includes automated exposure control, adjustment of the mA and/or kV according to patient size and/or use of iterative reconstruction technique.  COMPARISON:  CT of the abdomen and pelvis 03/26/2022  FINDINGS: Segmentation: 5 non rib-bearing lumbar type vertebral bodies are present. The lowest fully formed vertebral body is L5.  Alignment: No significant listhesis is present. Straightening of the normal lumbar lordosis is stable.  Vertebrae: Vertebral body heights are maintained. Schmorl's nodes at superior endplate of L4 and at H8-8 are stable. No acute or healing fractures are present. Visualized sacrum and pelvis demonstrate no acute abnormality. Degenerative changes are noted at the SI joints bilaterally.  Paraspinal and other soft tissues: Bilateral renal cysts are again noted. No imaging follow-up recommended. Atherosclerotic changes are present in the aorta without aneurysm. No significant adenopathy is present.  Disc levels: T12-L1: Bilateral facet hypertrophy is present. No significant stenosis is present.  L1-2: Bilateral facet hypertrophy is present. No significant disc protrusion or stenosis is present.  L2-3: Vacuum disc noted. A broad-based disc protrusion and  advanced facet hypertrophy contribute to moderate to severe central canal stenosis left greater than right foraminal narrowing may contribute to left hip pain.  L3-4: A leftward disc protrusion is present. Moderate facet hypertrophy is noted bilaterally. Mild to moderate left subarticular and foraminal stenosis is present.  L4-5: A broad-based disc protrusion is present. Chronic loss of disc height is present. Advanced facet hypertrophy is noted. Moderate central and left foraminal stenosis is present.  L5-S1: A rightward disc protrusion is present. Asymmetric right-sided facet hypertrophy is noted. Moderate to severe right and moderate left foraminal stenosis is present.  IMPRESSION: 1. No acute abnormality. 2. Multilevel spondylosis of the lumbar spine as described, similar to the prior exams. 3. Left-sided disease is greatest at L2-3 which could contribute to left hip pain.  Electronically Signed   By: San Morelle M.D.   On: 06/04/2022 18:21 DG Hip Unilat W or Wo Pelvis 2-3 Views Left CLINICAL DATA:  Hip pain.  EXAM: DG HIP (WITH OR WITHOUT PELVIS) 2-3V LEFT  COMPARISON:  None Available.  FINDINGS: Vascular calcifications. No fracture or dislocation. No cause for acute hip pain identified.  IMPRESSION: Vascular calcifications. No cause for acute hip pain identified.  Electronically Signed   By: Dorise Bullion III M.D.   On: 06/04/2022 15:18  Note: Reviewed        Physical Exam  General appearance: Well nourished, well developed,  and well hydrated. In no apparent acute distress Mental status: Alert, oriented x 3 (person, place, & time)       Respiratory: No evidence of acute respiratory distress Eyes: PERLA Vitals: BP (!) 153/60 (BP Location: Right Arm, Patient Position: Sitting, Cuff Size: Normal) Comment (Cuff Size): foreram  Pulse 68   Temp (!) 97.3 F (36.3 C) (Temporal)   Resp 16   Ht '5\' 7"'$  (1.702 m)   Wt 295 lb (133.8 kg)   SpO2 100%    BMI 46.20 kg/m  BMI: Estimated body mass index is 46.2 kg/m as calculated from the following:   Height as of this encounter: '5\' 7"'$  (1.702 m).   Weight as of this encounter: 295 lb (133.8 kg). Ideal: Ideal body weight: 61.6 kg (135 lb 12.9 oz) Adjusted ideal body weight: 90.5 kg (199 lb 7.7 oz)  Thoracic Spine Area Exam  Skin & Axial Inspection: No masses, redness, or swelling Alignment: Symmetrical Functional ROM: Pain restricted ROM Stability: No instability detected Muscle Tone/Strength: Functionally intact. No obvious neuro-muscular anomalies detected. Sensory (Neurological): Unimpaired Muscle strength & Tone: No palpable anomalies  Bilateral shoulder pain  Right hand pain   Lumbar Exam  Skin & Axial Inspection: No masses, redness, or swelling Alignment: Symmetrical Functional ROM: Pain restricted ROM       Stability: No instability detected Muscle Tone/Strength: Functionally intact. No obvious neuro-muscular anomalies detected. Sensory (Neurological): Musculoskeletal pain pattern  Ambulation: Patient came in today in a wheel chair Gait: Modified gait pattern (slower gait speed, wider stride width, and longer stance duration) associated with morbid obesity Posture: Difficulty standing up straight, due to pain    Lower Extremity Exam      Side: Right lower extremity   Side: Left lower extremity  Stability: No instability observed           Stability: No instability observed          Skin & Extremity Inspection: Skin color, temperature, and hair growth are WNL. No peripheral edema or cyanosis. No masses, redness, swelling, asymmetry, or associated skin lesions. No contractures.   Skin & Extremity Inspection: Skin color, temperature, and hair growth are WNL. No peripheral edema or cyanosis. No masses, redness, swelling, asymmetry, or associated skin lesions. No contractures.  Functional ROM: Pain restricted ROM for all joints of the lower extremity           Functional ROM:  Pain restricted ROM for all joints of the lower extremity          Muscle Tone/Strength: Functionally intact. No obvious neuro-muscular anomalies detected.   Muscle Tone/Strength: Functionally intact. No obvious neuro-muscular anomalies detected.  Sensory (Neurological): Neurogenic pain pattern         Sensory (Neurological): Neurogenic pain pattern, musculoskeletal pain of left hip        DTR: Patellar: deferred today Achilles: deferred today Plantar: deferred today   DTR: Patellar: deferred today Achilles: deferred today Plantar: deferred today  Palpation: No palpable anomalies   Palpation: No palpable anomalies     Assessment   Status Diagnosis  Persistent Persistent Persistent 1. Chronic pain of right hand   2. Lumbar spondylosis   3. Right elbow pain   4. Spinal stenosis, lumbar region, with neurogenic claudication   5. Bilateral primary osteoarthritis of knee   6. Chronic pain syndrome       Plan of Care  [name] has a current medication list which includes the following long-term medication(s): albuterol, apixaban, atorvastatin, carvedilol,  duloxetine, febuxostat, fluticasone, glucagon, toujeo max solostar, linaclotide, memantine, montelukast, novolog flexpen, pantoprazole, symbicort, torsemide, and glucose.  Pharmacotherapy (Medications Ordered): Meds ordered this encounter  Medications   oxyCODONE-acetaminophen (PERCOCET) 7.5-325 MG tablet    Sig: Take 1 tablet by mouth every 12 (twelve) hours.    Dispense:  60 tablet    Refill:  0   oxyCODONE-acetaminophen (PERCOCET) 7.5-325 MG tablet    Sig: Take 1 tablet by mouth every 12 (twelve) hours.    Dispense:  60 tablet    Refill:  0   oxyCODONE-acetaminophen (PERCOCET) 7.5-325 MG tablet    Sig: Take 1 tablet by mouth every 12 (twelve) hours.    Dispense:  60 tablet    Refill:  0    No orders of the defined types were placed in this encounter.    Follow-up plan:   Return in about 3 months (around  09/13/2022) for Medication Management, in person.      Interventional management options:  Considering:   Intra-articular knee Hyalgan injections can be done in wheelchair Patient is on Plavix and is morbidly obese and unable to get on fluoroscopy table for interventional therapy.   PRN Procedures:   None at this time      Recent Visits Date Type Provider Dept  03/21/22 Office Visit Gillis Santa, MD [facility]  Showing recent visits within past 90 days and meeting all other requirements Today's Visits Date Type Provider Dept  06/15/22 Office Visit Gillis Santa, MD [facility]  Showing today's visits and meeting all other requirements Future Appointments Date Type Provider Dept  09/07/22 Appointment Gillis Santa, MD [facility]  Showing future appointments within next 90 days and meeting all other requirements  I discussed the assessment and treatment plan with the patient. The patient was provided an opportunity to ask questions and all were answered. The patient agreed with the plan and demonstrated an understanding of the instructions.  Patient advised to call back or seek an in-person evaluation if the symptoms or condition worsens.  Duration of encounter: 30 minutes.  Note by: Gillis Santa, MD Date: 06/15/2022; Time: 1:59 PM

## 2022-06-15 NOTE — Progress Notes (Signed)
Nursing Pain Medication Assessment:  Safety precautions to be maintained throughout the outpatient stay will include: orient to surroundings, keep bed in low position, maintain call bell within reach at all times, provide assistance with transfer out of bed and ambulation.  Medication Inspection Compliance: Pill count conducted under aseptic conditions, in front of the patient. Neither the pills nor the bottle was removed from the patient's sight at any time. Once count was completed pills were immediately returned to the patient in their original bottle.  Medication: Oxycodone/APAP Pill/Patch Count:  41.5 of 60 pills remain Pill/Patch Appearance: Markings consistent with prescribed medication Bottle Appearance: Standard pharmacy container. Clearly labeled. Filled Date: 01 / 27 / 2024 Last Medication intake:  Today

## 2022-06-15 NOTE — Progress Notes (Signed)
Left ear infection, taking amoxicillin 500 mg tid.  Scheduled surgery 07/04/22 left hand for CTS.  Patient fell and was evaluated for left hip pain.  PCP has prescribed hydro- apap 10-325 mg

## 2022-06-20 ENCOUNTER — Ambulatory Visit (INDEPENDENT_AMBULATORY_CARE_PROVIDER_SITE_OTHER): Payer: PPO | Admitting: Podiatry

## 2022-06-20 ENCOUNTER — Encounter: Payer: Self-pay | Admitting: Podiatry

## 2022-06-20 DIAGNOSIS — E1169 Type 2 diabetes mellitus with other specified complication: Secondary | ICD-10-CM | POA: Diagnosis not present

## 2022-06-20 DIAGNOSIS — N189 Chronic kidney disease, unspecified: Secondary | ICD-10-CM | POA: Diagnosis not present

## 2022-06-20 DIAGNOSIS — M21969 Unspecified acquired deformity of unspecified lower leg: Secondary | ICD-10-CM | POA: Diagnosis not present

## 2022-06-20 DIAGNOSIS — B351 Tinea unguium: Secondary | ICD-10-CM | POA: Diagnosis not present

## 2022-06-20 DIAGNOSIS — M79609 Pain in unspecified limb: Secondary | ICD-10-CM | POA: Diagnosis not present

## 2022-06-20 DIAGNOSIS — I129 Hypertensive chronic kidney disease with stage 1 through stage 4 chronic kidney disease, or unspecified chronic kidney disease: Secondary | ICD-10-CM | POA: Diagnosis not present

## 2022-06-20 DIAGNOSIS — N2581 Secondary hyperparathyroidism of renal origin: Secondary | ICD-10-CM | POA: Diagnosis not present

## 2022-06-20 DIAGNOSIS — N183 Chronic kidney disease, stage 3 unspecified: Secondary | ICD-10-CM | POA: Diagnosis not present

## 2022-06-20 DIAGNOSIS — N179 Acute kidney failure, unspecified: Secondary | ICD-10-CM | POA: Diagnosis not present

## 2022-06-20 DIAGNOSIS — E1122 Type 2 diabetes mellitus with diabetic chronic kidney disease: Secondary | ICD-10-CM | POA: Diagnosis not present

## 2022-06-20 DIAGNOSIS — I2510 Atherosclerotic heart disease of native coronary artery without angina pectoris: Secondary | ICD-10-CM | POA: Diagnosis not present

## 2022-06-20 DIAGNOSIS — D631 Anemia in chronic kidney disease: Secondary | ICD-10-CM | POA: Diagnosis not present

## 2022-06-20 NOTE — Progress Notes (Signed)
This patient returns to my office for at risk foot care.  This patient requires this care by a professional since this patient will be at risk due to having diabetes and CKD. She presents to the office with her daughter in a wheelchair.  This patient is unable to cut nails herself since the patient cannot reach her nails.These nails are painful walking and wearing shoes.  This patient presents for at risk foot care today.  General Appearance  Alert, conversant and in no acute stress.  Vascular  Dorsalis pedis and posterior tibial  pulses are palpable  bilaterally.  Capillary return is within normal limits  bilaterally. Temperature is within normal limits  bilaterally.  Neurologic  Senn-Weinstein monofilament wire test within normal limits  bilaterally. Muscle power within normal limits bilaterally.  Nails Thick disfigured discolored nails with subungual debris  from hallux to fifth toes bilaterally. No evidence of bacterial infection or drainage bilaterally.  Orthopedic  No limitations of motion  feet .  No crepitus or effusions noted.  No bony pathology or digital deformities noted. Deviation of toes 2-5 right foot at the MPJ.  Skin  normotropic skin with no porokeratosis noted bilaterally.  No signs of infections or ulcers noted.     Onychomycosis  Pain in right toes  Pain in left toes  Consent was obtained for treatment procedures.   Mechanical debridement of nails 1-5  bilaterally performed with a nail nipper.  Filed with dremel without incident.    Return office visit  10 weeks                  Told patient to return for periodic foot care and evaluation due to potential at risk complications.    [name] DPM

## 2022-06-21 ENCOUNTER — Other Ambulatory Visit: Payer: Self-pay | Admitting: Family Medicine

## 2022-06-21 NOTE — Telephone Encounter (Signed)
Chart supports rx. Last OV: 06/13/2022

## 2022-06-29 DIAGNOSIS — G5602 Carpal tunnel syndrome, left upper limb: Secondary | ICD-10-CM | POA: Diagnosis not present

## 2022-06-30 ENCOUNTER — Encounter: Payer: Self-pay | Admitting: Hematology

## 2022-07-04 ENCOUNTER — Other Ambulatory Visit (HOSPITAL_COMMUNITY): Payer: Self-pay

## 2022-07-05 ENCOUNTER — Ambulatory Visit (HOSPITAL_COMMUNITY)
Admission: RE | Admit: 2022-07-05 | Discharge: 2022-07-05 | Disposition: A | Discharge status: Home / Self Care | Payer: PPO | Source: Ambulatory Visit | Attending: Nephrology | Admitting: Nephrology

## 2022-07-05 DIAGNOSIS — D631 Anemia in chronic kidney disease: Secondary | ICD-10-CM | POA: Insufficient documentation

## 2022-07-05 DIAGNOSIS — N189 Chronic kidney disease, unspecified: Secondary | ICD-10-CM | POA: Diagnosis not present

## 2022-07-05 MED ORDER — SODIUM CHLORIDE 0.9 % IV SOLN
200.0000 mg | Freq: Once | INTRAVENOUS | Status: AC
  Administered 2022-07-05: 200 mg via INTRAVENOUS
  Filled 2022-07-05: qty 200

## 2022-07-06 ENCOUNTER — Other Ambulatory Visit: Payer: PPO

## 2022-07-06 DIAGNOSIS — Z515 Encounter for palliative care: Secondary | ICD-10-CM

## 2022-07-07 ENCOUNTER — Encounter (HOSPITAL_COMMUNITY): Payer: Self-pay

## 2022-07-07 ENCOUNTER — Ambulatory Visit
Admission: RE | Admit: 2022-07-07 | Discharge: 2022-07-07 | Disposition: A | Discharge status: Home / Self Care | Payer: PPO | Source: Ambulatory Visit | Attending: Nurse Practitioner | Admitting: Nurse Practitioner

## 2022-07-07 ENCOUNTER — Encounter: Payer: Self-pay | Admitting: Hematology

## 2022-07-07 DIAGNOSIS — Z1231 Encounter for screening mammogram for malignant neoplasm of breast: Secondary | ICD-10-CM

## 2022-07-09 ENCOUNTER — Telehealth: Payer: PPO | Admitting: Nurse Practitioner

## 2022-07-09 DIAGNOSIS — R0602 Shortness of breath: Secondary | ICD-10-CM

## 2022-07-09 NOTE — Patient Instructions (Signed)
[name], thank you for joining Gildardo Pounds, NP for today's virtual visit.  While this provider is not your primary care provider (PCP), if your PCP is located in our provider database this encounter information will be shared with them immediately following your visit.   [facility] account gives you access to today's visit and all your visits, tests, and labs performed at [facility] " click here if you don't have a [facility] account or go to [email or web address]  Consent: (Patient) [name] provided verbal consent for this virtual visit at the beginning of the encounter.  Current Medications:  Current Outpatient Medications:    albuterol (PROAIR HFA) 108 (90 Base) MCG/ACT inhaler, Inhale 1-2 puffs into the lungs every 6 (six) hours as needed for wheezing or shortness of breath., Disp: 6.7 g, Rfl: 1   anastrozole (ARIMIDEX) 1 MG tablet, TAKE 1 TABLET BY MOUTH EVERY DAY, Disp: 28 tablet, Rfl: 1   apixaban (ELIQUIS) 5 MG TABS tablet, Take 1 tablet (5 mg total) by mouth 2 (two) times daily., Disp: 180 tablet, Rfl: 3   atorvastatin (LIPITOR) 80 MG tablet, TAKE 1 TABLET BY MOUTH EVERY DAY, Disp: 90 tablet, Rfl: 3   carvedilol (COREG) 12.5 MG tablet, TAKE 1 TABLET BY MOUTH 2 TIMES DAILY, Disp: 180 tablet, Rfl: 1   cetirizine (ZYRTEC) 10 MG tablet, Take 10 mg by mouth daily as needed for allergies or rhinitis., Disp: , Rfl:    Continuous Blood Gluc Sensor (FREESTYLE LIBRE 14 DAY SENSOR) MISC, PLACE 1 DEVICE ON THE SKIN AS DIRECTED EVERY 14 DAYS, Disp: 6 each, Rfl: 2   diclofenac Sodium (VOLTAREN) 1 % GEL, Apply 2 g topically 4 (four) times daily. (Patient taking differently: Apply 2 g topically 4 (four) times daily as needed (pain).), Disp: 150 g, Rfl: 2   DULoxetine (CYMBALTA) 30 MG capsule, TAKE 1 CAPSULE BY MOUTH EVERY DAY, Disp: 90 capsule, Rfl: 0   Elastic Bandages & Supports (B & B CARPAL TUNNEL BRACE) MISC, Use brace on wrist as needed for  wrist pain, Disp: 2 each, Rfl: 10   febuxostat (ULORIC) 40 MG tablet, Take 1 tablet (40 mg total) by mouth daily., Disp: 90 tablet, Rfl: 3   fluticasone (FLONASE) 50 MCG/ACT nasal spray, Place 1 spray into both nostrils in the morning and at bedtime. (Patient taking differently: Place 1 spray into both nostrils 2 (two) times daily as needed for rhinitis or allergies.), Disp: [id number] mL, Rfl: 3   folic acid (FOLVITE) 1 MG tablet, TAKE 1 TABLET BY MOUTH EVERY DAY, Disp: 90 tablet, Rfl: 1   glucagon 1 MG injection, Inject 1 mg into the vein once as needed for up to 1 dose., Disp: 1 each, Rfl: 12   glucose 4 GM chewable tablet, Chew 1 tablet (4 g total) by mouth as needed for low blood sugar., Disp: 50 tablet, Rfl: 12   guaifenesin (ROBITUSSIN) 100 MG/5ML syrup, Take 200 mg by mouth 3 (three) times daily as needed for cough., Disp: , Rfl:    insulin glargine, 2 Unit Dial, (TOUJEO MAX SOLOSTAR) 300 UNIT/ML Solostar Pen, Inject 40 Units into the skin daily. Patient assistance program provides. Patient reports 40 units 04/24/2022, Disp: 12 mL, Rfl: 1   linaclotide (LINZESS) 145 MCG CAPS capsule, Take 1 capsule (145 mcg total) by mouth daily before breakfast., Disp: 30 capsule, Rfl: 2   meclizine (ANTIVERT) 25 MG tablet, Take 1-2 tablets (25-50 mg total) by mouth 2 (two) times  daily as needed for dizziness., Disp: 90 tablet, Rfl: 0   memantine (NAMENDA) 5 MG tablet, TAKE 2 TABLETS BY MOUTH EVERY DAY and TAKE 1 TABLET BY MOUTH EVERY EVENING (Patient taking differently: Take 5-10 mg by mouth See admin instructions. TAKE 2 TABLETS BY MOUTH EVERY DAY and TAKE 1 TABLET BY MOUTH EVERY EVENING), Disp: 270 tablet, Rfl: 5   montelukast (SINGULAIR) 10 MG tablet, Take 1 tablet (10 mg total) by mouth at bedtime., Disp: 30 tablet, Rfl: 3   nitroGLYCERIN (NITROSTAT) 0.4 MG SL tablet, Take one tablet under the tongue every 5 minutes as needed for chest pain, Disp: 50 tablet, Rfl: 0   NOVOLOG FLEXPEN 100 UNIT/ML FlexPen, Inject  14 Units into the skin 2 (two) times daily with a meal. Max daily dose 70 units, Disp: 60 mL, Rfl: 0   nystatin cream (MYCOSTATIN), Apply 1 Application topically 2 (two) times daily., Disp: 30 g, Rfl: 3   ondansetron (ZOFRAN) 4 MG tablet, Take 1 tablet (4 mg total) by mouth every 8 (eight) hours as needed for nausea or vomiting., Disp: 20 tablet, Rfl: 0   oxyCODONE-acetaminophen (PERCOCET) 7.5-325 MG tablet, Take 1 tablet by mouth every 12 (twelve) hours., Disp: 60 tablet, Rfl: 0   [START ON 08/02/2022] oxyCODONE-acetaminophen (PERCOCET) 7.5-325 MG tablet, Take 1 tablet by mouth every 12 (twelve) hours., Disp: 60 tablet, Rfl: 0   [START ON 09/01/2022] oxyCODONE-acetaminophen (PERCOCET) 7.5-325 MG tablet, Take 1 tablet by mouth every 12 (twelve) hours., Disp: 60 tablet, Rfl: 0   pantoprazole (PROTONIX) 20 MG tablet, TAKE 1 TABLET BY MOUTH EVERY DAY, Disp: 30 tablet, Rfl: 5   sodium polystyrene (SPS) 15 GM/60ML suspension, TAKE 60 MLS BY MOUTH ONCE WEEKLY TO KEEP POTASSIUM DOWN, Disp: 240 mL, Rfl: 0   SYMBICORT 160-4.5 MCG/ACT inhaler, Inhale 2 puffs by mouth twice daily, Disp: 11 g, Rfl: 0   torsemide (DEMADEX) 20 MG tablet, TAKE 1 TABLET BY MOUTH EVERY DAY. TAKE AN EXTRA DOSE FOR WEIGHT GAIN, 3LB IN 1 DAY OR 5LB IN 1 WEEK, Disp: 30 tablet, Rfl: 0   Vitamin D, Ergocalciferol, (DRISDOL) 1.25 MG (50000 UNIT) CAPS capsule, TAKE 1 CAPSULE BY MOUTH every 7 days, Disp: 4 capsule, Rfl: 1   Medications ordered in this encounter:  No orders of the defined types were placed in this encounter.    *If you need refills on other medications prior to your next appointment, please contact your pharmacy*  Follow-Up: Call back or seek an in-person evaluation if the symptoms worsen or if the condition fails to improve as anticipated.  [facility] [phone]  Other Instructions Weigh daily. Administer torsemide as prescribed. Follow up with Cardiology or PCP if symptoms persis   If you have been  instructed to have an in-person evaluation today at a local Urgent Care facility, please use the link below. It will take you to a list of all of our available [facility], including address, phone number and hours of operation. Please do not delay care.  [facility]  If you or a family member do not have a primary care provider, use the link below to schedule a visit and establish care. When you choose a [facility] primary care physician or advanced practice provider, you gain a long-term partner in health. Find a Primary Care Provider  Learn more about [facility] in-office and virtual care options: [facility]

## 2022-07-09 NOTE — Progress Notes (Signed)
Virtual Visit Consent   [name], you are scheduled for a virtual visit with a [facility] provider today. Just as with appointments in the office, your consent must be obtained to participate. Your consent will be active for this visit and any virtual visit you may have with one of our providers in the next 365 days. If you have a MyChart account, a copy of this consent can be sent to you electronically.  As this is a virtual visit, video technology does not allow for your provider to perform a traditional examination. This may limit your provider's ability to fully assess your condition. If your provider identifies any concerns that need to be evaluated in person or the need to arrange testing (such as labs, EKG, etc.), we will make arrangements to do so. Although advances in technology are sophisticated, we cannot ensure that it will always work on either your end or our end. If the connection with a video visit is poor, the visit may have to be switched to a telephone visit. With either a video or telephone visit, we are not always able to ensure that we have a secure connection.  By engaging in this virtual visit, you consent to the provision of healthcare and authorize for your insurance to be billed (if applicable) for the services provided during this visit. Depending on your insurance coverage, you may receive a charge related to this service.  I need to obtain your verbal consent now. Are you willing to proceed with your visit today? [name] has provided verbal consent on 07/09/2022 for a virtual visit (video or telephone). Gildardo Pounds, NP  Date: 07/09/2022 7:59 PM  Virtual Visit via Video Note   I, Gildardo Pounds, connected with  [name]  ([id number], 1934-11-15) on 07/09/22 at  7:00 PM EST by a video-enabled telemedicine application and verified that I am speaking with the correct person using two identifiers.  Location: Patient: Virtual Visit Location Patient:  Home Provider: Virtual Visit Location Provider: Home Office   I discussed the limitations of evaluation and management by telemedicine and the availability of in person appointments. The patient expressed understanding and agreed to proceed.    History of Present Illness: [name] is a 88 y.o. who identifies as a female who was assigned female at birth, and is being seen today for shortness of breath and abdominal pain.  [name] states she is having trouble breathing when she lays down and has been sitting up in a chair or on the side of the bed at night to help her breathe. Her granddaughter and daughter provide some of her history as well due to her memory impairment. She is having normal BMs per daughter. They have not been abe to weigh her due to malfunction of scales. She does have CHF andhas been taking her torsemide as prescribed. Eating frozen foods for dinner at times which may be contributing to some volume overload. Daughter however does not add salt to foods. Granddaughter believes her symptoms are due to anxiety because she is worried about her other granddaughter who is currently out of state at another college. She does not endorse any chest pain.    Problems:  Patient Active Problem List   Diagnosis Date Noted   Chronic left hip pain 06/13/2022   Dependent for medication administration 06/13/2022   Very limited skin mobility 05/29/2022   Swelling of right upper extremity 04/27/2022   Carpal tunnel syndrome on both  sides 04/27/2022   Hypoglycemia 04/09/2022   Diabetic peripheral neuropathy associated with type 2 diabetes mellitus ([place]) 04/05/2022   GI bleed 03/27/2022   Acute upper GI bleed 03/26/2022   Black stool 03/26/2022   Acute deep vein thrombosis (DVT) of femoral vein of right lower extremity ([place]) 03/26/2022   Stage 3b chronic kidney disease (CKD) ([place]) 03/26/2022   COPD (chronic obstructive pulmonary disease) ([place]) 03/26/2022   History of anemia due to  chronic kidney disease 03/26/2022   GAD (generalized anxiety disorder) 03/26/2022   Allergic rhinitis 03/26/2022   Type 2 diabetes mellitus with stage 4 chronic kidney disease, with long-term current use of insulin ([place]) 02/16/2022   Type 2 diabetes mellitus with diabetic neuropathy, with long-term current use of insulin ([place]) 02/16/2022   DM2 (diabetes mellitus, type 2) ([place]) 02/16/2022   Dermatosis papulosa nigra 02/09/2022   Positive D dimer 02/09/2022   CKD (chronic kidney disease), stage IV ([place]) 02/09/2022   Pneumobilia 02/08/2022   Severe episode of recurrent major depressive disorder, without psychotic features ([place]) 12/05/2021   AKI (acute kidney injury) ([place]) 11/22/2021   Acute on chronic diastolic (congestive) heart failure ([place]) 11/19/2021   Anxiety 11/10/2021   Breast cancer ([place]) 10/22/2021   Right elbow pain 04/27/2021   Chronic pain of right hand 04/27/2021   Iron deficiency anemia 09/13/2020   Bilateral primary osteoarthritis of knee 04/12/2020   Chronic radicular lumbar pain 03/18/2020   Lumbar spondylosis 03/18/2020   Spinal stenosis, lumbar region, with neurogenic claudication 03/18/2020   Moderate nonproliferative diabetic retinopathy of both eyes without macular edema associated with type 2 diabetes mellitus ([place]) 08/27/2019   Type 2 diabetes mellitus with hyperlipidemia ([place]) 12/13/2017   GERD with esophagitis 11/15/2016   Dementia ([place]) 11/15/2016   Endometrial polyp 06/23/2016   Vitamin D deficiency 08/02/2015   Acute kidney injury superimposed on chronic kidney disease ([place]) 12/18/2014   Gout 10/27/2014   Vertigo 09/24/2014   CN (constipation) 05/10/2014   COPD with chronic bronchitis 04/21/2014   Estrogen deficiency 02/19/2014   Atypical chest pain 12/31/2013   Insomnia 09/17/2012   Long term current use of anticoagulant therapy 02/02/2012   Chronic diastolic CHF (congestive heart failure) ([place]) 01/29/2012   Class 3 obesity ([place]) 02/15/2009   Anemia  02/15/2009   TENSION HEADACHE 01/07/2008   Hyperlipidemia 12/03/2006   Essential hypertension 12/03/2006    Allergies:  Allergies  Allergen Reactions   Sulfonamide Derivatives Swelling and Other (See Comments)    Mouth swelling- no resp issues noted   Lokelma [Sodium Zirconium Cyclosilicate] Other (See Comments)    Headache, stomach upset   Aricept [Donepezil Hcl] Diarrhea and Nausea And Vomiting    GI upset/loose stools   Tramadol Nausea And Vomiting   Medications:  Current Outpatient Medications:    albuterol (PROAIR HFA) 108 (90 Base) MCG/ACT inhaler, Inhale 1-2 puffs into the lungs every 6 (six) hours as needed for wheezing or shortness of breath., Disp: 6.7 g, Rfl: 1   anastrozole (ARIMIDEX) 1 MG tablet, TAKE 1 TABLET BY MOUTH EVERY DAY, Disp: 28 tablet, Rfl: 1   apixaban (ELIQUIS) 5 MG TABS tablet, Take 1 tablet (5 mg total) by mouth 2 (two) times daily., Disp: 180 tablet, Rfl: 3   atorvastatin (LIPITOR) 80 MG tablet, TAKE 1 TABLET BY MOUTH EVERY DAY, Disp: 90 tablet, Rfl: 3   carvedilol (COREG) 12.5 MG tablet, TAKE 1 TABLET BY MOUTH 2 TIMES DAILY, Disp: 180 tablet, Rfl: 1   cetirizine (ZYRTEC) 10 MG tablet,  Take 10 mg by mouth daily as needed for allergies or rhinitis., Disp: , Rfl:    Continuous Blood Gluc Sensor (FREESTYLE LIBRE 14 DAY SENSOR) MISC, PLACE 1 DEVICE ON THE SKIN AS DIRECTED EVERY 14 DAYS, Disp: 6 each, Rfl: 2   diclofenac Sodium (VOLTAREN) 1 % GEL, Apply 2 g topically 4 (four) times daily. (Patient taking differently: Apply 2 g topically 4 (four) times daily as needed (pain).), Disp: 150 g, Rfl: 2   DULoxetine (CYMBALTA) 30 MG capsule, TAKE 1 CAPSULE BY MOUTH EVERY DAY, Disp: 90 capsule, Rfl: 0   Elastic Bandages & Supports (B & B CARPAL TUNNEL BRACE) MISC, Use brace on wrist as needed for wrist pain, Disp: 2 each, Rfl: 10   febuxostat (ULORIC) 40 MG tablet, Take 1 tablet (40 mg total) by mouth daily., Disp: 90 tablet, Rfl: 3   fluticasone (FLONASE) 50 MCG/ACT  nasal spray, Place 1 spray into both nostrils in the morning and at bedtime. (Patient taking differently: Place 1 spray into both nostrils 2 (two) times daily as needed for rhinitis or allergies.), Disp: Q000111Q mL, Rfl: 3   folic acid (FOLVITE) 1 MG tablet, TAKE 1 TABLET BY MOUTH EVERY DAY, Disp: 90 tablet, Rfl: 1   glucagon 1 MG injection, Inject 1 mg into the vein once as needed for up to 1 dose., Disp: 1 each, Rfl: 12   glucose 4 GM chewable tablet, Chew 1 tablet (4 g total) by mouth as needed for low blood sugar., Disp: 50 tablet, Rfl: 12   guaifenesin (ROBITUSSIN) 100 MG/5ML syrup, Take 200 mg by mouth 3 (three) times daily as needed for cough., Disp: , Rfl:    insulin glargine, 2 Unit Dial, (TOUJEO MAX SOLOSTAR) 300 UNIT/ML Solostar Pen, Inject 40 Units into the skin daily. Patient assistance program provides. Patient reports 40 units 04/24/2022, Disp: 12 mL, Rfl: 1   linaclotide (LINZESS) 145 MCG CAPS capsule, Take 1 capsule (145 mcg total) by mouth daily before breakfast., Disp: 30 capsule, Rfl: 2   meclizine (ANTIVERT) 25 MG tablet, Take 1-2 tablets (25-50 mg total) by mouth 2 (two) times daily as needed for dizziness., Disp: 90 tablet, Rfl: 0   memantine (NAMENDA) 5 MG tablet, TAKE 2 TABLETS BY MOUTH EVERY DAY and TAKE 1 TABLET BY MOUTH EVERY EVENING (Patient taking differently: Take 5-10 mg by mouth See admin instructions. TAKE 2 TABLETS BY MOUTH EVERY DAY and TAKE 1 TABLET BY MOUTH EVERY EVENING), Disp: 270 tablet, Rfl: 5   montelukast (SINGULAIR) 10 MG tablet, Take 1 tablet (10 mg total) by mouth at bedtime., Disp: 30 tablet, Rfl: 3   nitroGLYCERIN (NITROSTAT) 0.4 MG SL tablet, Take one tablet under the tongue every 5 minutes as needed for chest pain, Disp: 50 tablet, Rfl: 0   NOVOLOG FLEXPEN 100 UNIT/ML FlexPen, Inject 14 Units into the skin 2 (two) times daily with a meal. Max daily dose 70 units, Disp: 60 mL, Rfl: 0   nystatin cream (MYCOSTATIN), Apply 1 Application topically 2 (two) times  daily., Disp: 30 g, Rfl: 3   ondansetron (ZOFRAN) 4 MG tablet, Take 1 tablet (4 mg total) by mouth every 8 (eight) hours as needed for nausea or vomiting., Disp: 20 tablet, Rfl: 0   oxyCODONE-acetaminophen (PERCOCET) 7.5-325 MG tablet, Take 1 tablet by mouth every 12 (twelve) hours., Disp: 60 tablet, Rfl: 0   [START ON 08/02/2022] oxyCODONE-acetaminophen (PERCOCET) 7.5-325 MG tablet, Take 1 tablet by mouth every 12 (twelve) hours., Disp: 60 tablet, Rfl: 0   [  START ON 09/01/2022] oxyCODONE-acetaminophen (PERCOCET) 7.5-325 MG tablet, Take 1 tablet by mouth every 12 (twelve) hours., Disp: 60 tablet, Rfl: 0   pantoprazole (PROTONIX) 20 MG tablet, TAKE 1 TABLET BY MOUTH EVERY DAY, Disp: 30 tablet, Rfl: 5   sodium polystyrene (SPS) 15 GM/60ML suspension, TAKE 60 MLS BY MOUTH ONCE WEEKLY TO KEEP POTASSIUM DOWN, Disp: 240 mL, Rfl: 0   SYMBICORT 160-4.5 MCG/ACT inhaler, Inhale 2 puffs by mouth twice daily, Disp: 11 g, Rfl: 0   torsemide (DEMADEX) 20 MG tablet, TAKE 1 TABLET BY MOUTH EVERY DAY. TAKE AN EXTRA DOSE FOR WEIGHT GAIN, 3LB IN 1 DAY OR 5LB IN 1 WEEK, Disp: 30 tablet, Rfl: 0   Vitamin D, Ergocalciferol, (DRISDOL) 1.25 MG (50000 UNIT) CAPS capsule, TAKE 1 CAPSULE BY MOUTH every 7 days, Disp: 4 capsule, Rfl: 1  Observations/Objective: Patient is well-developed, well-nourished in no acute distress.  Resting comfortably at home.  Head is normocephalic, atraumatic.  No labored breathing. She is not in any resp distress.  Speech is clear and coherent with logical content.  Patient is alert and oriented at baseline.   Assessment and Plan: 1. Shortness of breath Weigh daily. Administer torsemide as prescribed. Follow up with Cardiology or PCP if symptoms persist.   Follow Up Instructions: I discussed the assessment and treatment plan with the patient. The patient was provided an opportunity to ask questions and all were answered. The patient agreed with the plan and demonstrated an understanding of the  instructions.  A copy of instructions were sent to the patient via MyChart unless otherwise noted below.    The patient was advised to call back or seek an in-person evaluation if the symptoms worsen or if the condition fails to improve as anticipated.  Time:  I spent 13 minutes with the patient via telehealth technology discussing the above problems/concerns.    Gildardo Pounds, NP

## 2022-07-10 ENCOUNTER — Other Ambulatory Visit: Payer: Self-pay

## 2022-07-10 DIAGNOSIS — C50812 Malignant neoplasm of overlapping sites of left female breast: Secondary | ICD-10-CM

## 2022-07-10 NOTE — Progress Notes (Signed)
COMMUNITY PALLIATIVE CARE SW NOTE  PATIENT NAME: [name] DOB: Jun 03, 1934 MRN: [id number]  PRIMARY CARE PROVIDER: Bonnita Hollow, MD  RESPONSIBLE PARTY:  Acct ID - Guarantor Home Phone Work Phone Relationship Acct Type  [id number] ADAMARI, [phone]  Self P/F     [address] [place], [place] [address]   Palliative Care Encounter/Clinical Social Work  [facility] SW completed a follow-up call to patient's daughter-[name] to assess needs and comfort of patient via status update. Patient report that patient is doing "okay". She stated that patient have something new, a new pain every week. She is eating at least two meals per day and a snack. Patient  has only lost about 10 lbs in the last two weeks. [name] expressed caregiver fatigue and became tearful. She is overwhelmed with patient's care and feels that she does not have any support. SW discussed several programs that may be helpful to her ([facility], [employer], local day treatment programs). SW also encouraged her to contact her insurance company to determine if there was any services available to in-home care. SW also provided education to her regarding hospice care. SW provided reassurance of support to her and encouraged her to call with any questions or concerns.   SW completed a call to patient's case manager through [facility] to update on caregiver needs. She will follow-up with patient's daughter to offer resources and support.     Social History   Tobacco Use   Smoking status: Never    Passive exposure: Past   Smokeless tobacco: Never   Tobacco comments:    Both parents smoked, patient was exposed/ "Raised up in smoke, my whole life."  Substance Use Topics   Alcohol use: No    Alcohol/week: 0.0 standard drinks of alcohol    CODE STATUS: DNR ADVANCED DIRECTIVES: No MOST FORM COMPLETE:  Yes HOSPICE EDUCATION PROVIDED: Yes, education provided.  Duration of telephonic encounter and documentation:  30 minutes  Katheren Puller, LCSW

## 2022-07-11 ENCOUNTER — Other Ambulatory Visit: Payer: Self-pay

## 2022-07-11 ENCOUNTER — Encounter: Payer: Self-pay | Admitting: Hematology

## 2022-07-11 ENCOUNTER — Inpatient Hospital Stay (HOSPITAL_BASED_OUTPATIENT_CLINIC_OR_DEPARTMENT_OTHER): Payer: PPO | Admitting: Hematology

## 2022-07-11 ENCOUNTER — Encounter (HOSPITAL_COMMUNITY): Payer: Self-pay

## 2022-07-11 ENCOUNTER — Inpatient Hospital Stay: Payer: PPO | Attending: Nurse Practitioner

## 2022-07-11 VITALS — BP 134/68 | HR 87 | Temp 97.8°F | Resp 20 | Ht 67.0 in | Wt 282.0 lb

## 2022-07-11 DIAGNOSIS — C50812 Malignant neoplasm of overlapping sites of left female breast: Secondary | ICD-10-CM

## 2022-07-11 DIAGNOSIS — E785 Hyperlipidemia, unspecified: Secondary | ICD-10-CM | POA: Diagnosis not present

## 2022-07-11 DIAGNOSIS — M129 Arthropathy, unspecified: Secondary | ICD-10-CM | POA: Diagnosis not present

## 2022-07-11 DIAGNOSIS — Z79811 Long term (current) use of aromatase inhibitors: Secondary | ICD-10-CM | POA: Insufficient documentation

## 2022-07-11 DIAGNOSIS — D509 Iron deficiency anemia, unspecified: Secondary | ICD-10-CM

## 2022-07-11 DIAGNOSIS — E1165 Type 2 diabetes mellitus with hyperglycemia: Secondary | ICD-10-CM | POA: Insufficient documentation

## 2022-07-11 DIAGNOSIS — I2510 Atherosclerotic heart disease of native coronary artery without angina pectoris: Secondary | ICD-10-CM | POA: Diagnosis not present

## 2022-07-11 DIAGNOSIS — Z170 Estrogen receptor positive status [ER+]: Secondary | ICD-10-CM | POA: Diagnosis not present

## 2022-07-11 DIAGNOSIS — I10 Essential (primary) hypertension: Secondary | ICD-10-CM | POA: Insufficient documentation

## 2022-07-11 DIAGNOSIS — G4730 Sleep apnea, unspecified: Secondary | ICD-10-CM | POA: Insufficient documentation

## 2022-07-11 DIAGNOSIS — N189 Chronic kidney disease, unspecified: Secondary | ICD-10-CM | POA: Insufficient documentation

## 2022-07-11 DIAGNOSIS — C50912 Malignant neoplasm of unspecified site of left female breast: Secondary | ICD-10-CM | POA: Insufficient documentation

## 2022-07-11 DIAGNOSIS — E559 Vitamin D deficiency, unspecified: Secondary | ICD-10-CM | POA: Insufficient documentation

## 2022-07-11 LAB — CBC WITH DIFFERENTIAL (CANCER CENTER ONLY)
Abs Immature Granulocytes: 0.05 10*3/uL (ref 0.00–0.07)
Basophils Absolute: 0 10*3/uL (ref 0.0–0.1)
Basophils Relative: 0 %
Eosinophils Absolute: 0.4 10*3/uL (ref 0.0–0.5)
Eosinophils Relative: 3 %
HCT: 27.9 % — ABNORMAL LOW (ref 36.0–46.0)
Hemoglobin: 8.7 g/dL — ABNORMAL LOW (ref 12.0–15.0)
Immature Granulocytes: 0 %
Lymphocytes Relative: 19 %
Lymphs Abs: 2.5 10*3/uL (ref 0.7–4.0)
MCH: 29 pg (ref 26.0–34.0)
MCHC: 31.2 g/dL (ref 30.0–36.0)
MCV: 93 fL (ref 80.0–100.0)
Monocytes Absolute: 0.7 10*3/uL (ref 0.1–1.0)
Monocytes Relative: 5 %
Neutro Abs: 9.4 10*3/uL — ABNORMAL HIGH (ref 1.7–7.7)
Neutrophils Relative %: 73 %
Platelet Count: 251 10*3/uL (ref 150–400)
RBC: 3 MIL/uL — ABNORMAL LOW (ref 3.87–5.11)
RDW: 14.2 % (ref 11.5–15.5)
WBC Count: 13 10*3/uL — ABNORMAL HIGH (ref 4.0–10.5)
nRBC: 0 % (ref 0.0–0.2)

## 2022-07-11 LAB — CMP (CANCER CENTER ONLY)
ALT: 8 U/L (ref 0–44)
AST: 10 U/L — ABNORMAL LOW (ref 15–41)
Albumin: 3.4 g/dL — ABNORMAL LOW (ref 3.5–5.0)
Alkaline Phosphatase: 96 U/L (ref 38–126)
Anion gap: 8 (ref 5–15)
BUN: 56 mg/dL — ABNORMAL HIGH (ref 8–23)
CO2: 28 mmol/L (ref 22–32)
Calcium: 9 mg/dL (ref 8.9–10.3)
Chloride: 103 mmol/L (ref 98–111)
Creatinine: 1.97 mg/dL — ABNORMAL HIGH (ref 0.44–1.00)
GFR, Estimated: 24 mL/min — ABNORMAL LOW (ref >60)
Glucose, Bld: 175 mg/dL — ABNORMAL HIGH (ref 70–99)
Potassium: 5.1 mmol/L (ref 3.5–5.1)
Sodium: 139 mmol/L (ref 135–145)
Total Bilirubin: 0.4 mg/dL (ref 0.3–1.2)
Total Protein: 7.7 g/dL (ref 6.5–8.1)

## 2022-07-11 LAB — IRON AND IRON BINDING CAPACITY (CC-WL,HP ONLY)
Iron: 27 ug/dL — ABNORMAL LOW (ref 28–170)
Saturation Ratios: 11 % (ref 10.4–31.8)
TIBC: 239 ug/dL — ABNORMAL LOW (ref 250–450)
UIBC: 212 ug/dL (ref 148–442)

## 2022-07-11 LAB — FERRITIN: Ferritin: 281 ng/mL (ref 11–307)

## 2022-07-11 NOTE — Progress Notes (Signed)
.    Hematology/Oncology Clinic Followup Note  Date of service 07/11/2022    Patient Care Team: Bonnita Hollow, MD as PCP - General (Family Medicine) [name], [name], MD (Inactive) as Consulting Physician (Cardiology) [name], MD as Consulting Physician (Ophthalmology) [name], MD as Consulting Physician (General Surgery) [name], MD (Inactive) as Referring Physician (Obstetrics and Gynecology) [name] [name], MD as Consulting Physician (Ophthalmology) [facility], [name], MD as Consulting Physician (Endocrinology) [name], MD as Consulting Physician (Hematology) [name], DO as Consulting Physician (Neurology) Kassie Mends, RN as [place], Heather E, MD as Consulting Physician (Cardiology)  CHIEF COMPLAINTS/PURPOSE OF CONSULTATION:  Follow-up for continued evaluation and management of breast cancer  DIAGNOSIS: left-sided presumed stage IA  (pT1c,pNx[cN0], cM0) invasive ductal carcinoma grade 2 out of 3, strongly ER +100%, strongly PR +100% and HER-2/neu negative. Given her high risk cardiac status lumpectomy had to be done under local anesthesia and sentinel lymph node biopsy was not possible. She has accompanying DCIS of intermediate grade present at margins. ECOG performance status is 3. -Had a mammogram/tomosynthesis done on 07/29/2015 which shows residual suspicious calcification in the left breast postero-medial to the biopsy site. Has known residual DCIS.  No evidence of new malignancy.  No evidence of right breast malignancy.   CURRENT TREATMENT: Arimidex '1mg'$  po daily  HISTORY OF PRESENTING ILLNESS: -please see my initial consultation for details of initial presentation   INTERVAL HISTORY:  [name] is here for follow-up of her breast cancer on endocrine therapy with Arimidex for continued evaluation and management.  Patient was last seen by me on 01/26/2022 and she complained of  appetite loss.  Patient is accompanied by her family members during this visit. She reports she has been doing fairly well since our last visit. She notes she has been regularly taking Arimidex 1 mg without any new or severe toxicities.   She notes that she has many other medical issues and has been admitted to ED since our last visit.  She has discontinued Aspirin due to bleeding issues. She is still taking Eliquis 5 mg. Patient is regularly taking vitamin-D supplements.   Her family member notes that the patient recently had IV Iron infusion last week. They are unsure who ordered the infusion, but thinks its either her kidney specialist or her PCP.   During this visit, patient complains of increased anxiety which causes her insomnia. She is currently not taking any medication to help her insomnia.   She denies any abnormal bleeding, infection issues, fever, chills, night sweats, back pain, or leg swelling. She does complain of occasional upper abdominal pain.  Patient notes that she occasionally takes Oxycodone for pain management.   Patient notes that her diabetes has been controlled with her medications.  MEDICAL HISTORY:  Past Medical History:  Diagnosis Date   Abdominal pain 01/26/2012   Acute kidney injury superimposed on chronic kidney disease ([facility]) 12/18/2014   Allergy    takes Mucinex daily as needed   Anemia, unspecified    Anxiety    takes Clonazepam daily as needed   Anxiety associated with depression 06/08/2017   Arthritis    Breast cancer ([place])    Breast cancer screening 02/19/2014   Breast mass 10/27/2014   Chronic diastolic CHF (congestive heart failure) (HCC)    takes Furosemide daily   Chronic pain syndrome 02/06/2015   CKD (chronic kidney disease), stage III (HCC)    Coronary artery disease  a. s/p IMI 2004 tx with BMS to RCA;  b. s/p Promus DES to RCA 2/12 c. abnormal nuc 2016 -> cath with med rx. d. NSTEMI 02/2020  mv CAD with severe ISR in pRCA and  m/dRCA lesion both treated with DES.   Debility 08/01/2012   Dementia ([place])    Depression    takes Cymbalta daily   Diabetes mellitus    insulin daily   DOE (dyspnea on exertion) 06/24/2014   Dysphagia, unspecified(787.20) 07/31/2012   Fungal dermatitis 11/13/2007   Qualifier: Diagnosis of  By: Burnice Logan  MD, Doretha Sou    GERD (gastroesophageal reflux disease)    takes Protonix daily   Gout, unspecified    Headache    occasionally   History of blood clots    History of deep venous thrombosis 01/27/2012   History of pulmonary embolus (PE) 04/22/2014   Hyperkalemia 07/26/2017   Hyperlipidemia    takes Pravastatin daily   Hypertension    Insomnia    Joint pain    Joint swelling    Morbid obesity ([place])    Multiple benign nevi 11/15/2016   Myocardial infarction ([place]) 2004   Non-STEMI (non-ST elevated myocardial infarction) ([place]) 02/15/2020   Numbness    Obstructive sleep apnea    does not wear cpap   Osteoarthritis    Osteoarthritis    Osteoarthrosis, unspecified whether generalized or localized, lower leg    Pain in the chest 12/31/2013   Pain, chronic    Personal history of other diseases of the digestive system 12/03/2006   Qualifier: Diagnosis of  By: Burnice Logan  MD, Doretha Sou    Polymyalgia rheumatica [facility])    Presence of stent in right coronary artery    Pulmonary emboli ([place]) 01/2012   felt to need lifelong anticoagulation but Xarelto dc in 02/2020 due to need for DAPT   Situational depression 06/04/2009   Qualifier: Diagnosis of  By: Burnice Logan  MD, Doretha Sou    Syncope, vasovagal 07/04/2021   Type 2 diabetes mellitus with hyperlipidemia ([place]) 02/16/2022   Urinary incontinence    takes Linzess daily   Vaginitis and vulvovaginitis 06/23/2016   Vertigo    hx of;was taking Meclizine if needed   Wears dentures     SURGICAL HISTORY: Past Surgical History:  Procedure Laterality Date   ABDOMINAL HYSTERECTOMY     partial   APPENDECTOMY     blood clots/legs and  lungs  2013   BREAST BIOPSY Left 07/22/2014   BREAST BIOPSY Left 02/10/2013   BREAST LUMPECTOMY Left 11/05/2014   BREAST LUMPECTOMY WITH RADIOACTIVE SEED LOCALIZATION Left 11/05/2014   Procedure: LEFT BREAST LUMPECTOMY WITH RADIOACTIVE SEED LOCALIZATION;  Surgeon: [name], MD;  Location: [facility];  Service: General;  Laterality: Left;   CARDIAC CATHETERIZATION     COLONOSCOPY     CORONARY ANGIOPLASTY  2   CORONARY STENT INTERVENTION N/A 02/17/2020   Procedure: CORONARY STENT INTERVENTION;  Surgeon: Leonie Man, MD;  Location: [place] CV LAB;  Service: Cardiovascular;  Laterality: N/A;   ESOPHAGOGASTRODUODENOSCOPY (EGD) WITH PROPOFOL N/A 11/07/2016   Procedure: ESOPHAGOGASTRODUODENOSCOPY (EGD) WITH PROPOFOL;  Surgeon: Gatha Mayer, MD;  Location: [facility];  Service: Endoscopy;  Laterality: N/A;   EXCISION OF SKIN TAG Right 11/05/2014   Procedure: EXCISION OF RIGHT EYELID SKIN TAG;  Surgeon: [name], MD;  Location: [place];  Service: General;  Laterality: Right;   EYE SURGERY Bilateral    cataract    GASTRIC BYPASS  1977  reversed in 1979, [place] CATH AND CORONARY ANGIOGRAPHY N/A 02/17/2020   Procedure: LEFT HEART CATH AND CORONARY ANGIOGRAPHY;  Surgeon: Leonie Man, MD;  Location: [place] CV LAB;  Service: Cardiovascular;  Laterality: N/A;   LEFT HEART CATHETERIZATION WITH CORONARY ANGIOGRAM N/A 06/29/2014   Procedure: LEFT HEART CATHETERIZATION WITH CORONARY ANGIOGRAM;  Surgeon: Troy Sine, MD;  Location: [facility] CATH LAB;  Service: Cardiovascular;  Laterality: N/A;   MEMBRANE PEEL Right 10/23/2018   Procedure: MEMBRANE PEEL;  Surgeon: Hurman Horn, MD;  Location: [place];  Service: Ophthalmology;  Laterality: Right;   MI with stent placement  2004   PARS PLANA VITRECTOMY Right 10/23/2018   Procedure: PARS PLANA VITRECTOMY WITH 25 GAUGE;  Surgeon: Hurman Horn, MD;  Location: [place];  Service: Ophthalmology;  Laterality: Right;     SOCIAL HISTORY: Social History   Socioeconomic History   Marital status: Widowed    Spouse name: Not on file   Number of children: 3   Years of education: Not on file   Highest education level: High school graduate  Occupational History   Occupation: retired  Tobacco Use   Smoking status: Never    Passive exposure: Past   Smokeless tobacco: Never   Tobacco comments:    Both parents smoked, patient was exposed/ "Raised up in smoke, my whole life."  Vaping Use   Vaping Use: Never used  Substance and Sexual Activity   Alcohol use: No    Alcohol/week: 0.0 standard drinks of alcohol   Drug use: No   Sexual activity: Not Currently  Other Topics Concern   Not on file  Social History Narrative   Not on file   Social Determinants of Health   Financial Resource Strain: Low Risk  (06/05/2022)   Overall Financial Resource Strain (CARDIA)    Difficulty of Paying Living Expenses: Not hard at all  Food Insecurity: No Food Insecurity (06/05/2022)   Hunger Vital Sign    Worried About Running Out of Food in the Last Year: Never true    Ran Out of Food in the Last Year: Never true  Transportation Needs: No Transportation Needs (06/05/2022)   PRAPARE - Hydrologist (Medical): No    Lack of Transportation (Non-Medical): No  Physical Activity: Insufficiently Active (06/05/2022)   Exercise Vital Sign    Days of Exercise per Week: 3 days    Minutes of Exercise per Session: 10 min  Stress: Stress Concern Present (06/05/2022)   [place]    Feeling of Stress : Rather much  Social Connections: Moderately Isolated (04/20/2022)   Social Connection and Isolation Panel [NHANES]    Frequency of Communication with Friends and Family: Once a week    Frequency of Social Gatherings with Friends and Family: Once a week    Attends Religious Services: 1 to 4 times per year    Active Member of [employer] or  Organizations: Yes    Attends Archivist Meetings: 1 to 4 times per year    Marital Status: Widowed  Intimate Partner Violence: Not At Risk (04/20/2022)   Humiliation, Afraid, Rape, and Kick questionnaire    Fear of Current or Ex-Partner: No    Emotionally Abused: No    Physically Abused: No    Sexually Abused: No    FAMILY HISTORY: Family History  Problem Relation Age of Onset   Breast cancer Mother 69  Heart disease Mother    Throat cancer Father    Hypertension Father    Arthritis Father    Diabetes Father    Arthritis Sister    Obesity Sister    Diabetes Sister    Heart disease Cousin    Colon cancer Neg Hx    Stomach cancer Neg Hx    Esophageal cancer Neg Hx     ALLERGIES:  is allergic to sulfonamide derivatives, lokelma [sodium zirconium cyclosilicate], aricept [donepezil hcl], and tramadol.  MEDICATIONS:  Current Outpatient Medications  Medication Sig Dispense Refill   albuterol (PROAIR HFA) 108 (90 Base) MCG/ACT inhaler Inhale 1-2 puffs into the lungs every 6 (six) hours as needed for wheezing or shortness of breath. 6.7 g 1   anastrozole (ARIMIDEX) 1 MG tablet TAKE 1 TABLET BY MOUTH EVERY DAY 28 tablet 1   apixaban (ELIQUIS) 5 MG TABS tablet Take 1 tablet (5 mg total) by mouth 2 (two) times daily. 180 tablet 3   atorvastatin (LIPITOR) 80 MG tablet TAKE 1 TABLET BY MOUTH EVERY DAY 90 tablet 3   carvedilol (COREG) 12.5 MG tablet TAKE 1 TABLET BY MOUTH 2 TIMES DAILY 180 tablet 1   cetirizine (ZYRTEC) 10 MG tablet Take 10 mg by mouth daily as needed for allergies or rhinitis.     Continuous Blood Gluc Sensor (FREESTYLE LIBRE 14 DAY SENSOR) MISC PLACE 1 DEVICE ON THE SKIN AS DIRECTED EVERY 14 DAYS 6 each 2   diclofenac Sodium (VOLTAREN) 1 % GEL Apply 2 g topically 4 (four) times daily. (Patient taking differently: Apply 2 g topically 4 (four) times daily as needed (pain).) 150 g 2   DULoxetine (CYMBALTA) 30 MG capsule TAKE 1 CAPSULE BY MOUTH EVERY DAY 90  capsule 0   Elastic Bandages & Supports (B & B CARPAL TUNNEL BRACE) MISC Use brace on wrist as needed for wrist pain 2 each 10   febuxostat (ULORIC) 40 MG tablet Take 1 tablet (40 mg total) by mouth daily. 90 tablet 3   fluticasone (FLONASE) 50 MCG/ACT nasal spray Place 1 spray into both nostrils in the morning and at bedtime. (Patient taking differently: Place 1 spray into both nostrils 2 (two) times daily as needed for rhinitis or allergies.) [id number] mL 3   folic acid (FOLVITE) 1 MG tablet TAKE 1 TABLET BY MOUTH EVERY DAY 90 tablet 1   glucagon 1 MG injection Inject 1 mg into the vein once as needed for up to 1 dose. 1 each 12   glucose 4 GM chewable tablet Chew 1 tablet (4 g total) by mouth as needed for low blood sugar. 50 tablet 12   guaifenesin (ROBITUSSIN) 100 MG/5ML syrup Take 200 mg by mouth 3 (three) times daily as needed for cough.     insulin glargine, 2 Unit Dial, (TOUJEO MAX SOLOSTAR) 300 UNIT/ML Solostar Pen Inject 40 Units into the skin daily. Patient assistance program provides. Patient reports 40 units 04/24/2022 12 mL 1   linaclotide (LINZESS) 145 MCG CAPS capsule Take 1 capsule (145 mcg total) by mouth daily before breakfast. 30 capsule 2   meclizine (ANTIVERT) 25 MG tablet Take 1-2 tablets (25-50 mg total) by mouth 2 (two) times daily as needed for dizziness. 90 tablet 0   memantine (NAMENDA) 5 MG tablet TAKE 2 TABLETS BY MOUTH EVERY DAY and TAKE 1 TABLET BY MOUTH EVERY EVENING (Patient taking differently: Take 5-10 mg by mouth See admin instructions. TAKE 2 TABLETS BY MOUTH EVERY DAY and TAKE 1 TABLET BY  MOUTH EVERY EVENING) 270 tablet 5   montelukast (SINGULAIR) 10 MG tablet Take 1 tablet (10 mg total) by mouth at bedtime. 30 tablet 3   nitroGLYCERIN (NITROSTAT) 0.4 MG SL tablet Take one tablet under the tongue every 5 minutes as needed for chest pain 50 tablet 0   NOVOLOG FLEXPEN 100 UNIT/ML FlexPen Inject 14 Units into the skin 2 (two) times daily with a meal. Max daily dose 70  units 60 mL 0   nystatin cream (MYCOSTATIN) Apply 1 Application topically 2 (two) times daily. 30 g 3   ondansetron (ZOFRAN) 4 MG tablet Take 1 tablet (4 mg total) by mouth every 8 (eight) hours as needed for nausea or vomiting. 20 tablet 0   oxyCODONE-acetaminophen (PERCOCET) 7.5-325 MG tablet Take 1 tablet by mouth every 12 (twelve) hours. 60 tablet 0   [START ON 08/02/2022] oxyCODONE-acetaminophen (PERCOCET) 7.5-325 MG tablet Take 1 tablet by mouth every 12 (twelve) hours. 60 tablet 0   [START ON 09/01/2022] oxyCODONE-acetaminophen (PERCOCET) 7.5-325 MG tablet Take 1 tablet by mouth every 12 (twelve) hours. 60 tablet 0   pantoprazole (PROTONIX) 20 MG tablet TAKE 1 TABLET BY MOUTH EVERY DAY 30 tablet 5   sodium polystyrene (SPS) 15 GM/60ML suspension TAKE 60 MLS BY MOUTH ONCE WEEKLY TO KEEP POTASSIUM DOWN 240 mL 0   SYMBICORT 160-4.5 MCG/ACT inhaler Inhale 2 puffs by mouth twice daily 11 g 0   torsemide (DEMADEX) 20 MG tablet TAKE 1 TABLET BY MOUTH EVERY DAY. TAKE AN EXTRA DOSE FOR WEIGHT GAIN, 3LB IN 1 DAY OR 5LB IN 1 WEEK 30 tablet 0   Vitamin D, Ergocalciferol, (DRISDOL) 1.25 MG (50000 UNIT) CAPS capsule TAKE 1 CAPSULE BY MOUTH every 7 days 4 capsule 1   No current facility-administered medications for this visit.    REVIEW OF SYSTEMS:   10 Point review of Systems was done is negative except as noted above.   PHYSICAL EXAMINATION:  ECOG PERFORMANCE STATUS:3  Vitals:   07/11/22 1314  BP: 134/68  Pulse: 87  Resp: 20  Temp: 97.8 F (36.6 C)  SpO2: 99%    Filed Weights   07/11/22 1314  Weight: 282 lb (127.9 kg)     Exam was given in a wheelchair.   NAD GENERAL:alert, in no acute distress and comfortable SKIN: no acute rashes, no significant lesions EYES: conjunctiva are pink and non-injected, sclera anicteric NECK: supple, no JVD LYMPH:  no palpable lymphadenopathy in the cervical, axillary or inguinal regions LUNGS: clear to auscultation b/l with normal respiratory  effort HEART: regular rate & rhythm ABDOMEN:  normoactive bowel sounds , non tender, not distended. Extremity: no pedal edema PSYCH: alert & oriented x 3 with fluent speech NEURO: no focal motor/sensory deficits  LABORATORY DATA:  I have reviewed the data as listed  .    Latest Ref Rng & Units 07/11/2022   12:53 PM 05/05/2022    4:24 PM 03/29/2022    4:58 AM  CBC  WBC 4.0 - 10.5 K/uL 13.0  14.4  10.8   Hemoglobin 12.0 - 15.0 g/dL 8.7  8.5  8.6   Hematocrit 36.0 - 46.0 % 27.9  26.5  29.4   Platelets 150 - 400 K/uL 251  266  235    . CBC    Component Value Date/Time   WBC 13.0 (H) 07/11/2022 1253   WBC 14.4 (H) 05/05/2022 1624   RBC 3.00 (L) 07/11/2022 1253   HGB 8.7 (L) 07/11/2022 1253   HGB 9.3 (L)  03/02/2020 1517   HGB 8.7 (L) 03/21/2017 1351   HCT 27.9 (L) 07/11/2022 1253   HCT 29.5 (L) 03/02/2020 1517   HCT 28.9 (L) 03/21/2017 1351   PLT 251 07/11/2022 1253   PLT 254 03/02/2020 1517   MCV 93.0 07/11/2022 1253   MCV 88 03/02/2020 1517   MCV 84.3 03/21/2017 1351   MCH 29.0 07/11/2022 1253   MCHC 31.2 07/11/2022 1253   RDW 14.2 07/11/2022 1253   RDW 13.1 03/02/2020 1517   RDW 15.5 (H) 03/21/2017 1351   LYMPHSABS 2.5 07/11/2022 1253   LYMPHSABS 2.4 03/21/2017 1351   MONOABS 0.7 07/11/2022 1253   MONOABS 0.7 03/21/2017 1351   EOSABS 0.4 07/11/2022 1253   EOSABS 0.2 03/21/2017 1351   BASOSABS 0.0 07/11/2022 1253   BASOSABS 0.0 03/21/2017 1351   .    Latest Ref Rng & Units 07/11/2022   12:53 PM 05/05/2022    4:24 PM 03/29/2022    4:58 AM  CMP  Glucose 70 - 99 mg/dL 175  360  105   BUN 8 - 23 mg/dL 56  46  41   Creatinine 0.44 - 1.00 mg/dL 1.97  1.53  2.00   Sodium 135 - 145 mmol/L 139  137  143   Potassium 3.5 - 5.1 mmol/L 5.1  4.5  4.5   Chloride 98 - 111 mmol/L 103  100  110   CO2 22 - 32 mmol/L '28  26  27   '$ Calcium 8.9 - 10.3 mg/dL 9.0  8.5  8.7   Total Protein 6.5 - 8.1 g/dL 7.7     Total Bilirubin 0.3 - 1.2 mg/dL 0.4     Alkaline Phos 38 - 126 U/L  96     AST 15 - 41 U/L 10     ALT 0 - 44 U/L 8       . Lab Results  Component Value Date   IRON 27 (L) 07/11/2022   TIBC 239 (L) 07/11/2022   IRONPCTSAT 11 07/11/2022   (Iron and TIBC)  Lab Results  Component Value Date   FERRITIN 281 07/11/2022       RADIOGRAPHIC STUDIES: I have personally reviewed the radiological images as listed and agreed with the findings in the report.  No results found. Diagnostic mammogram b/l 11/01/17  IMPRESSION: No significant change in suspicious microcalcifications posterior to the lumpectomy site in the left breast compared to the mammogram of 2018. No suspicious findings in the right breast. Exclusion of some posterior tissue due to patient decreased mobility.   RECOMMENDATION: Diagnostic mammogram is suggested in 1 year. (Code:[id number]-B-01Y)  CLINICAL DATA:  Annual mammography. The patient had surgery for DCIS in the left breast in 2016. There were positive margins at surgery but the patient could not return for additional surgery. Known suspicious calcifications remain. EXAM: 2D DIGITAL DIAGNOSTIC BILATERAL MAMMOGRAM WITH CAD AND ADJUNCT TOMO COMPARISON:  Previous exam(s). ACR Breast Density Category b: There are scattered areas of fibroglandular density. FINDINGS: The suspicious calcifications posterior to the left lumpectomy site are stable. No other interval changes or other suspicious findings. Mammographic images were processed with CAD. IMPRESSION: Continued suspicious calcifications posterior to the left lumpectomy site. No other changes. RECOMMENDATION: Continued annual mammography for surveillance. Continued oncologic follow up. I have discussed the findings and recommendations with the patient. Results were also provided in writing at the conclusion of the visit. If applicable, a reminder letter will be sent to the patient regarding the next appointment. BI-RADS CATEGORY  6: Known biopsy-proven malignancy.      Electronically Signed   By: Dorise Bullion III M.D   On: 08/16/2016 15:21    ASSESSMENT & PLAN:   [name] is a very wonderful 88 y.o. African-American female with multiple medical comorbidities as described above with   #1 Left-sided presumed stage IA  (pT1c,pNx[cN0], cM0) invasive ductal carcinoma grade 2 out of 3, strongly ER +100%, strongly PR +100% and HER-2/neu negative. Given her high risk cardiac status lumpectomy had to be done under local anesthesia and sentinel lymph node biopsy was not possible. She has accompanying DCIS of intermediate grade present at margins. ECOG performance status is 3. Dexa scan "low bone mass" per WHO. -Bone density scan on 03/12/2017 with results showing: T-score of 0.5 at left forearm. Lumbar spine and dual femurs not completed due to patient in a wheelchair and not able to lay flat due to SOB while laying flat.   #2 significant coronary artery disease with stress test in February 2016 suggesting likely multivessel disease. Has uncontrolled diabetes, hypertension, dyslipidemia, untreated sleep apnea, coronary disease, severe arthritis all of which are significantly limiting her quality of life.  #3 vitamin D deficiency - 25OH vitamin D level of 10, s/p high dose ergocalciferol re-placement with 25OH Vit D improvement to 30.9 in 10/2017. Now on vit D 2000 units daily.   #4  Anemia- anemia of chronic disease from CKD + ? Slow GI losses . Multiple metabolic insults in the bone marrow including uncontrolled diabetes .  PLAN: -Discussed lab results from today, 07/11/2022, with the patient. CBC shows elevated WBC at 13.0, decreased hemoglobin of 8.7, and decreased hematocrit of 27.9. CMP is pending.  Ferritin 281 and iron levels with iron saturation 11% -Discussed Mammogram results from 07/07/2022 with the patient. Mammogram results showed No mammographic evidence of malignancy. -Recommended to follow-up with her PCP regarding her anxiety and insomnia.   -Answered all of patient's questions.  -Should has no clinical or mammographic evidence of breast cancer progression at this time. -No notable toxicities from her current dose of Arimidex. -Continue Arimidex 1 mg p.o. daily. -Continue high-dose vitamin D as ergocalciferol and also vitamin B-12.   FOLLOW-UP: RTc with Dr Irene Limbo with labs in 6 months   The total time spent in the appointment was 21 minutes* .  All of the patient's questions were answered with apparent satisfaction. The patient knows to call the clinic with any problems, questions or concerns.   Sullivan Lone MD MS AAHIVMS [facility] Hematology/Oncology Physician [facility]  .*Total Encounter Time as defined by the Centers for Medicare and Medicaid Services includes, in addition to the face-to-face time of a patient visit (documented in the note above) non-face-to-face time: obtaining and reviewing outside history, ordering and reviewing medications, tests or procedures, care coordination (communications with other health care professionals or caregivers) and documentation in the medical record.   I, Cleda Mccreedy, am acting as a Education administrator for Sullivan Lone, MD.  .I have reviewed the above documentation for accuracy and completeness, and I agree with the above. [name] MD

## 2022-07-12 ENCOUNTER — Other Ambulatory Visit: Payer: Self-pay | Admitting: Hematology

## 2022-07-12 ENCOUNTER — Ambulatory Visit (INDEPENDENT_AMBULATORY_CARE_PROVIDER_SITE_OTHER): Payer: PPO | Admitting: Family Medicine

## 2022-07-12 ENCOUNTER — Ambulatory Visit (INDEPENDENT_AMBULATORY_CARE_PROVIDER_SITE_OTHER): Payer: PPO

## 2022-07-12 ENCOUNTER — Other Ambulatory Visit: Payer: Self-pay | Admitting: Family Medicine

## 2022-07-12 ENCOUNTER — Ambulatory Visit (INDEPENDENT_AMBULATORY_CARE_PROVIDER_SITE_OTHER): Payer: PPO | Admitting: *Deleted

## 2022-07-12 ENCOUNTER — Encounter: Payer: Self-pay | Admitting: Family Medicine

## 2022-07-12 VITALS — BP 130/82 | HR 79 | Temp 97.6°F | Wt 282.0 lb

## 2022-07-12 DIAGNOSIS — D649 Anemia, unspecified: Secondary | ICD-10-CM | POA: Diagnosis not present

## 2022-07-12 DIAGNOSIS — J984 Other disorders of lung: Secondary | ICD-10-CM

## 2022-07-12 DIAGNOSIS — E785 Hyperlipidemia, unspecified: Secondary | ICD-10-CM

## 2022-07-12 DIAGNOSIS — R42 Dizziness and giddiness: Secondary | ICD-10-CM | POA: Diagnosis not present

## 2022-07-12 DIAGNOSIS — R0602 Shortness of breath: Secondary | ICD-10-CM | POA: Diagnosis not present

## 2022-07-12 DIAGNOSIS — E1169 Type 2 diabetes mellitus with other specified complication: Secondary | ICD-10-CM

## 2022-07-12 DIAGNOSIS — R0600 Dyspnea, unspecified: Secondary | ICD-10-CM

## 2022-07-12 DIAGNOSIS — J439 Emphysema, unspecified: Secondary | ICD-10-CM

## 2022-07-12 DIAGNOSIS — J9811 Atelectasis: Secondary | ICD-10-CM

## 2022-07-12 DIAGNOSIS — R079 Chest pain, unspecified: Secondary | ICD-10-CM | POA: Diagnosis not present

## 2022-07-12 DIAGNOSIS — E119 Type 2 diabetes mellitus without complications: Secondary | ICD-10-CM

## 2022-07-12 DIAGNOSIS — I2782 Chronic pulmonary embolism: Secondary | ICD-10-CM | POA: Diagnosis not present

## 2022-07-12 DIAGNOSIS — I10 Essential (primary) hypertension: Secondary | ICD-10-CM

## 2022-07-12 DIAGNOSIS — F419 Anxiety disorder, unspecified: Secondary | ICD-10-CM

## 2022-07-12 DIAGNOSIS — I5032 Chronic diastolic (congestive) heart failure: Secondary | ICD-10-CM | POA: Diagnosis not present

## 2022-07-12 DIAGNOSIS — J4489 Other specified chronic obstructive pulmonary disease: Secondary | ICD-10-CM | POA: Diagnosis not present

## 2022-07-12 DIAGNOSIS — G4700 Insomnia, unspecified: Secondary | ICD-10-CM | POA: Diagnosis not present

## 2022-07-12 NOTE — Chronic Care Management (AMB) (Signed)
Chronic Care Management   CCM RN Visit Note  07/12/2022 Name: [name] MRN: [id number] DOB: June 06, 1934  Subjective: [name] is a 88 y.o. year old female who is a primary care patient of Nichole Hollow, MD. The patient was referred to the Chronic Care Management team for assistance with care management needs subsequent to provider initiation of CCM services and plan of care.    Today's Visit:  Engaged with patient by telephone for follow up visit.        Goals Addressed             This Visit's Progress    CCM (DIABETES) EXPECTED OUTCOME:  MONITOR, SELF-MANAGE AND REDUCE SYMPTOMS OF DIABETES       Current Barriers:  Knowledge Deficits related to Diabetes management Chronic Disease Management support and education needs related to Diabetes and diet Cognitive Deficits Per patient's daughter [name], CBG is monitored with [facility], states fasting ranges have been in low 100's with no further hypoglycemic episodes, due to Toujeo being decreased from 42 units down to 40, pt has glucose tablets on hand, is trying to eat 3 meals per day plus snack at bedtime. Daughter reports there will be a home visit on 07/18/22 at 1230 pm to discuss Hospice/ palliative care options. Daughter reports pt is taking Trazodone 50 mg at hs and this is helping with sleep  Planned Interventions: Reviewed medications with patient and discussed importance of medication adherence;        Counseled on importance of regular laboratory monitoring as prescribed;        Advised patient, providing education and rationale, to check cbg per CGM [facility] [place]  and record        call provider for findings outside established parameters;       Review of patient status, including review of consultants reports, relevant laboratory and other test results, and medications completed;       Advised patient to discuss any issues with blood sugar, hypoglycemia with provider;      Reinforced importance of  eating 3 meals per day Reinforced carbohydrate modified diet Reviewed upcoming scheduled appointments including primary care provider on 07/12/22 at 2 pm  Symptom Management: Take medications as prescribed   Attend all scheduled provider appointments Call pharmacy for medication refills 3-7 days in advance of running out of medications Attend church or other social activities Perform all self care activities independently  Perform IADL's (shopping, preparing meals, housekeeping, managing finances) independently Call provider office for new concerns or questions  check blood sugar at prescribed times: Freestyle [facility]  check feet daily for cuts, sores or redness enter blood sugar readings and medication or insulin into daily log take the blood sugar log to all doctor visits take the blood sugar meter to all doctor visits trim toenails straight across fill half of plate with vegetables limit fast food meals to no more than 1 per week manage portion size prepare main meal at home 3 to 5 days each week read food labels for fat, fiber, carbohydrates and portion size keep feet up while sitting wash and dry feet carefully every day Try eating a snack at hs with protein and carbohydrate Please stay in contact with your doctor if you have any issues with blood sugar, hypoglycemia (low blood sugar) Follow up with primary care provider on 07/12/22 at 2 pm Follow RULE OF 15 for low blood sugar management:  How to treat low blood sugars (Blood sugar less  than 70 mg/dl  Please follow the RULE OF 15 for the treatment of hypoglycemia treatment (When your blood sugars are less than 70 mg/ dl) STEP  1:  Take 15 grams of carbohydrates when your blood sugar is low, which includes:   3-4 glucose tabs or  3-4 oz of juice or regular soda or  One tube of glucose gel STEP 2:  Recheck blood sugar in 15 minutes STEP 3:  If your blood sugar is still low at the 15 minute recheck ---then, go back to STEP 1 and  treat again with another 15 grams of carbohydrates  Follow Up Plan: Telephone follow up appointment with care management team member scheduled for:  10/12/22 at 9 am       CCM (HYPERTENSION) EXPECTED OUTCOME: MONITOR, SELF-MANAGE AND REDUCE SYMPTOMS OF HYPERTENSION       Current Barriers:  Knowledge Deficits related to Hypertension management Chronic Disease Management support and education needs related to Hypertension Cognitive Deficits-dementia Spoke with patient's daughter [name] who lives with patient, reports patient has "mild dementia", states pt needs assistance with all ADL's, IADL's, pt has WC, walker, shower seat and stays in bed a lot, had home health PT for strengthening, states pt is trying to do chair and bed exercises prescribed by physical therapist.  [name] is primary caregiver,  pt has another daughter that provides meals, pt refuses PACE program.  [name] denies any needs for social work at present.  Planned Interventions: Evaluation of current treatment plan related to hypertension self management and patient's adherence to plan as established by provider;   Reviewed medications with patient and discussed importance of compliance;  Advised patient, providing education and rationale, to monitor blood pressure daily and record, calling PCP for findings outside established parameters;  Advised patient to discuss any issues with blood pressure or medications with provider; Reinforced importance of adherence to low sodium diet and reading food labels Reviewed importance of changing positions q 2 hours  Symptom Management: Take medications as prescribed   Attend all scheduled provider appointments Call pharmacy for medication refills 3-7 days in advance of running out of medications Attend church or other social activities Perform all self care activities independently  Perform IADL's (shopping, preparing meals, housekeeping, managing finances) independently Call provider office  for new concerns or questions  check blood pressure weekly choose a place to take my blood pressure (home, clinic or office, retail store) write blood pressure results in a log or diary learn about high blood pressure keep a blood pressure log take blood pressure log to all doctor appointments call doctor for signs and symptoms of high blood pressure keep all doctor appointments take medications for blood pressure exactly as prescribed report new symptoms to your doctor eat more whole grains, fruits and vegetables, lean meats and healthy fats Follow low sodium diet- read labels, limit/ avoid fast food Continue completing chair/ bed exercises prescribed by physical therapy Change positions every 2 hours  Follow Up Plan: Telephone follow up appointment with care management team member scheduled for:  10/12/22 at 9 am          Plan:Telephone follow up appointment with care management team member scheduled for:  10/12/22 at 9 am  Jacqlyn Larsen [facility], BSN RN Case Manager [employer] [phone]

## 2022-07-12 NOTE — Patient Instructions (Addendum)
For chest pain, we attempted to get EKG however we were unable to.  Because of this, we are placing referral back to cardiologist Dr. Johney Frame.   For chest xray,  Go to  [place] [phone]  If you develop severe chest pain or shortness of breath please go to the emergency department.   Once you have been cleared from a cardiac standpoint by your cardiology, we can then follow-up with anxiety.

## 2022-07-12 NOTE — Progress Notes (Signed)
Assessment/Plan:   Problem List Items Addressed This Visit       Cardiovascular and Mediastinum   Chronic diastolic CHF (congestive heart failure) ([place])   Relevant Orders   Ambulatory referral to Cardiology     Respiratory   COPD with chronic bronchitis     Endocrine   Type 2 diabetes mellitus with hyperlipidemia ([place])     Other   Insomnia (Chronic)   Dyspnea    [name] presenting symptoms of dyspnea worsening over the past week, particularly when lying flat, along with new chest pain, raise concerns for possible cardiac etiology or exacerbation of chronic conditions such as chronic heart failure or COPD.  Differential diagnosis: Acute exacerbation of chronic heart failure (CHF) is less likely due to the patient's recent weight loss and increased diuresis after starting additional torsemide. COPD exacerbation should be considered given the patient's history, although there has been no response to nebulizers at home. Myocardial ischemia or unstable angina could explain the new occurrence of chest pain. Pulmonary embolism remains a consideration despite the absence of a typical presentation and the patient being on anticoagulation therapy.  Plan: Order a chest X-ray to check for possible fluid in the lungs and exclude pneumonia. Refer to the cardiology department for further evaluation regarding escalated shortness of breath and new chest pain. Advise the patient to attempt nitroglycerin should chest pain recur and to report results. Maintain current medication regimen, including the continuation of Eliquis for anticoagulation.      Relevant Orders   DG Chest 2 View (Completed)   Ambulatory referral to Cardiology   Anxiety    [name] longstanding anxiety and insomnia are potentially worsening; impact on sleep quality is significant. Concern regarding the impact of these conditions on her cardiopulmonary status necessitates careful consideration of any medication  adjustments.  Plan:  Postpone any changes to medication for anxiety until after the cardiopulmonary evaluation due to potential respiratory compromise. Schedule a virtual follow-up visit specifically to address anxiety and insomnia management after cardiology evaluation. Explore non-pharmacologic interventions such as cognitive-behavioral therapy for insomnia (CBT-I) as adjunctive therapy.      Other Visit Diagnoses     Chest pain, unspecified type    -  Primary   Relevant Orders   DG Chest 2 View (Completed)   Ambulatory referral to Cardiology   Chronic anemia       Chronic pulmonary embolism without acute cor pulmonale, unspecified pulmonary embolism type (HCC)       Dizziness       Chronic vertigo          There are no discontinued medications.    Subjective:  HPI: Encounter date: 07/12/2022  [name] is a 88 y.o. female who has Hyperlipidemia; Class 3 obesity ([place]); Anemia; TENSION HEADACHE; Essential hypertension; Dyspnea; Chronic diastolic CHF (congestive heart failure) ([employer]); Long term current use of anticoagulant therapy; Insomnia; Atypical chest pain; Estrogen deficiency; COPD with chronic bronchitis; CN (constipation); Vertigo; Gout; Acute kidney injury superimposed on chronic kidney disease ([place]); Vitamin D deficiency; Endometrial polyp; GERD with esophagitis; Dementia ([place]); Type 2 diabetes mellitus with hyperlipidemia ([place]); Moderate nonproliferative diabetic retinopathy of both eyes without macular edema associated with type 2 diabetes mellitus ([place]); Chronic radicular lumbar pain; Lumbar spondylosis; Spinal stenosis, lumbar region, with neurogenic claudication; Bilateral primary osteoarthritis of knee; Iron deficiency anemia; Right elbow pain; Chronic pain of right hand; Breast cancer ([place]); Anxiety; Acute on chronic diastolic (congestive) heart failure ([place]); AKI (acute kidney injury) ([place]); Severe  episode of recurrent major depressive disorder, without psychotic  features ([place]); Pneumobilia; Dermatosis papulosa nigra; Positive D dimer; CKD (chronic kidney disease), stage IV ([place]); Type 2 diabetes mellitus with stage 4 chronic kidney disease, with long-term current use of insulin ([place]); Type 2 diabetes mellitus with diabetic neuropathy, with long-term current use of insulin ([place]); DM2 (diabetes mellitus, type 2) ([place]); Acute upper GI bleed; Black stool; Acute deep vein thrombosis (DVT) of femoral vein of right lower extremity ([place]); Stage 3b chronic kidney disease (CKD) ([place]); COPD (chronic obstructive pulmonary disease) ([place]); History of anemia due to chronic kidney disease; GAD (generalized anxiety disorder); Allergic rhinitis; GI bleed; Diabetic peripheral neuropathy associated with type 2 diabetes mellitus ([place]); Hypoglycemia; Swelling of right upper extremity; Carpal tunnel syndrome on both sides; Very limited skin mobility; Chronic left hip pain; and Dependent for medication administration on their problem list..   She  has a past medical history of Abdominal pain (01/26/2012), Acute kidney injury superimposed on chronic kidney disease ([place]) (12/18/2014), Allergy, Anemia, unspecified, Anxiety, Anxiety associated with depression (06/08/2017), Arthritis, Breast cancer ([place]), Breast cancer screening (02/19/2014), Breast mass (10/27/2014), Chronic diastolic CHF (congestive heart failure) ([place]), Chronic pain syndrome (02/06/2015), CKD (chronic kidney disease), stage III ([place]), Coronary artery disease, Debility (08/01/2012), Dementia ([place]), Depression, Diabetes mellitus, DOE (dyspnea on exertion) (06/24/2014), Dysphagia, unspecified(787.20) (07/31/2012), Fungal dermatitis (11/13/2007), GERD (gastroesophageal reflux disease), Gout, unspecified, Headache, History of blood clots, History of deep venous thrombosis (01/27/2012), History of pulmonary embolus (PE) (04/22/2014), Hyperkalemia (07/26/2017), Hyperlipidemia, Hypertension, Insomnia, Joint pain, Joint swelling, Morbid  obesity ([place]), Multiple benign nevi (11/15/2016), Myocardial infarction ([place]) (2004), Non-STEMI (non-ST elevated myocardial infarction) ([place]) (02/15/2020), Numbness, Obstructive sleep apnea, Osteoarthritis, Osteoarthritis, Osteoarthrosis, unspecified whether generalized or localized, lower leg, Pain in the chest (12/31/2013), Pain, chronic, Personal history of other diseases of the digestive system (12/03/2006), Polymyalgia rheumatica ([place]), Presence of stent in right coronary artery, Pulmonary emboli ([place]) (01/2012), Situational depression (06/04/2009), Syncope, vasovagal (07/04/2021), Type 2 diabetes mellitus with hyperlipidemia ([place]) (02/16/2022), Urinary incontinence, Vaginitis and vulvovaginitis (06/23/2016), Vertigo, and Wears dentures..   CHIEF COMPLAINT: Patient presents with worsening shortness of breath over the past week and difficulty sleeping due to anxiety. Reports new chest pain.  HISTORY OF PRESENT ILLNESS:  Shortness of Breath. Patient reports shortness of breath worsening over the past week, not solely related to physical exertion. Difficulty breathing when lying flat, necessitating sleeping while propped up. The patient denies experiencing shortness of breath only during bowel movements. No improvement noted with the use of nebulizers at home. History of pulmonary embolism and heart failure raise concern.  Anxiety. The patient expresses significant anxiety impacting sleep. Patient is under current treatment for anxiety but inquires about increasing medication to aid in rest. The complicated clinical picture requires cautious consideration before modifying any regimen due to potential respiratory compromise.  Constipation. Mention of constipation by the patient, unsure of its contribution to her respiratory symptoms.  Chest Pain. Reports new, intermittent chest pain localized in the central chest. The pain is brief and resolves spontaneously without use of nitroglycerin, which has  helped in past instances.   ROS:  Cardiology: New chest pain, episodic in nature; previous history of congestive heart failure. Respiratory: Difficulty breathing, especially when lying flat; history of COPD; no new episodes of wheezing noted. GI: Reports of constipation and stomach bloating; no diarrhea. Neurological: Reports chronic dizziness; difficulty with memory. Musculoskeletal: No new musculoskeletal complaints. REMAINDER OF ROS NEGATIVE.  Past Surgical History:  Procedure Laterality Date   ABDOMINAL HYSTERECTOMY  partial   APPENDECTOMY     blood clots/legs and lungs  2013   BREAST BIOPSY Left 07/22/2014   BREAST BIOPSY Left 02/10/2013   BREAST LUMPECTOMY Left 11/05/2014   BREAST LUMPECTOMY WITH RADIOACTIVE SEED LOCALIZATION Left 11/05/2014   Procedure: LEFT BREAST LUMPECTOMY WITH RADIOACTIVE SEED LOCALIZATION;  Surgeon: [name], MD;  Location: [facility];  Service: General;  Laterality: Left;   CARDIAC CATHETERIZATION     COLONOSCOPY     CORONARY ANGIOPLASTY  2   CORONARY STENT INTERVENTION N/A 02/17/2020   Procedure: CORONARY STENT INTERVENTION;  Surgeon: Leonie Man, MD;  Location: [facility];  Service: Cardiovascular;  Laterality: N/A;   ESOPHAGOGASTRODUODENOSCOPY (EGD) WITH PROPOFOL N/A 11/07/2016   Procedure: ESOPHAGOGASTRODUODENOSCOPY (EGD) WITH PROPOFOL;  Surgeon: Gatha Mayer, MD;  Location: [facility];  Service: Endoscopy;  Laterality: N/A;   EXCISION OF SKIN TAG Right 11/05/2014   Procedure: EXCISION OF RIGHT EYELID SKIN TAG;  Surgeon: [name], MD;  Location: [facility];  Service: General;  Laterality: Right;   EYE SURGERY Bilateral    cataract    GASTRIC BYPASS  1977    reversed in 1979, [place] CATH AND CORONARY ANGIOGRAPHY N/A 02/17/2020   Procedure: LEFT HEART CATH AND CORONARY ANGIOGRAPHY;  Surgeon: Leonie Man, MD;  Location: [facility];  Service: Cardiovascular;  Laterality: N/A;   LEFT HEART  CATHETERIZATION WITH CORONARY ANGIOGRAM N/A 06/29/2014   Procedure: LEFT HEART CATHETERIZATION WITH CORONARY ANGIOGRAM;  Surgeon: Troy Sine, MD;  Location: [facility] C[facility];  Service: Cardiovascular;  Laterality: N/A;   MEMBRANE PEEL Right 10/23/2018   Procedure: MEMBRANE PEEL;  Surgeon: Hurman Horn, MD;  Location: [facility];  Service: Ophthalmology;  Laterality: Right;   MI with stent placement  2004   PARS PLANA VITRECTOMY Right 10/23/2018   Procedure: PARS PLANA VITRECTOMY WITH 25 GAUGE;  Surgeon: Hurman Horn, MD;  Location: [facility];  Service: Ophthalmology;  Laterality: Right;    Outpatient Medications Prior to Visit  Medication Sig Dispense Refill   albuterol (PROAIR HFA) 108 (90 Base) MCG/ACT inhaler Inhale 1-2 puffs into the lungs every 6 (six) hours as needed for wheezing or shortness of breath. 6.7 g 1   anastrozole (ARIMIDEX) 1 MG tablet TAKE 1 TABLET BY MOUTH EVERY DAY 28 tablet 1   apixaban (ELIQUIS) 5 MG TABS tablet Take 1 tablet (5 mg total) by mouth 2 (two) times daily. 180 tablet 3   atorvastatin (LIPITOR) 80 MG tablet TAKE 1 TABLET BY MOUTH EVERY DAY 90 tablet 3   carvedilol (COREG) 12.5 MG tablet TAKE 1 TABLET BY MOUTH 2 TIMES DAILY 180 tablet 1   cetirizine (ZYRTEC) 10 MG tablet Take 10 mg by mouth daily as needed for allergies or rhinitis.     Continuous Blood Gluc Sensor (FREESTYLE LIBRE 14 DAY SENSOR) MISC PLACE 1 DEVICE ON THE SKIN AS DIRECTED EVERY 14 DAYS 6 each 2   diclofenac Sodium (VOLTAREN) 1 % GEL Apply 2 g topically 4 (four) times daily. (Patient taking differently: Apply 2 g topically 4 (four) times daily as needed (pain).) 150 g 2   DULoxetine (CYMBALTA) 30 MG capsule TAKE 1 CAPSULE BY MOUTH EVERY DAY 90 capsule 0   Elastic Bandages & Supports (B & B CARPAL TUNNEL BRACE) MISC Use brace on wrist as needed for wrist pain 2 each 10   febuxostat (ULORIC) 40 MG tablet Take 1 tablet (40 mg total) by mouth daily.  90 tablet 3   fluticasone (FLONASE) 50 MCG/ACT nasal spray  Place 1 spray into both nostrils in the morning and at bedtime. (Patient taking differently: Place 1 spray into both nostrils 2 (two) times daily as needed for rhinitis or allergies.) [id number] mL 3   folic acid (FOLVITE) 1 MG tablet TAKE 1 TABLET BY MOUTH EVERY DAY 90 tablet 1   glucagon 1 MG injection Inject 1 mg into the vein once as needed for up to 1 dose. 1 each 12   glucose 4 GM chewable tablet Chew 1 tablet (4 g total) by mouth as needed for low blood sugar. 50 tablet 12   guaifenesin (ROBITUSSIN) 100 MG/5ML syrup Take 200 mg by mouth 3 (three) times daily as needed for cough.     insulin glargine, 2 Unit Dial, (TOUJEO MAX SOLOSTAR) 300 UNIT/ML Solostar Pen Inject 40 Units into the skin daily. Patient assistance program provides. Patient reports 40 units 04/24/2022 12 mL 1   linaclotide (LINZESS) 145 MCG CAPS capsule Take 1 capsule (145 mcg total) by mouth daily before breakfast. 30 capsule 2   meclizine (ANTIVERT) 25 MG tablet Take 1-2 tablets (25-50 mg total) by mouth 2 (two) times daily as needed for dizziness. 90 tablet 0   memantine (NAMENDA) 5 MG tablet TAKE 2 TABLETS BY MOUTH EVERY DAY and TAKE 1 TABLET BY MOUTH EVERY EVENING (Patient taking differently: Take 5-10 mg by mouth See admin instructions. TAKE 2 TABLETS BY MOUTH EVERY DAY and TAKE 1 TABLET BY MOUTH EVERY EVENING) 270 tablet 5   montelukast (SINGULAIR) 10 MG tablet Take 1 tablet (10 mg total) by mouth at bedtime. 30 tablet 3   nitroGLYCERIN (NITROSTAT) 0.4 MG SL tablet Take one tablet under the tongue every 5 minutes as needed for chest pain 50 tablet 0   NOVOLOG FLEXPEN 100 UNIT/ML FlexPen Inject 14 Units into the skin 2 (two) times daily with a meal. Max daily dose 70 units 60 mL 0   nystatin cream (MYCOSTATIN) Apply 1 Application topically 2 (two) times daily. 30 g 3   ondansetron (ZOFRAN) 4 MG tablet Take 1 tablet (4 mg total) by mouth every 8 (eight) hours as needed for nausea or vomiting. 20 tablet 0   oxyCODONE-acetaminophen  (PERCOCET) 7.5-325 MG tablet Take 1 tablet by mouth every 12 (twelve) hours. 60 tablet 0   [START ON 08/02/2022] oxyCODONE-acetaminophen (PERCOCET) 7.5-325 MG tablet Take 1 tablet by mouth every 12 (twelve) hours. 60 tablet 0   [START ON 09/01/2022] oxyCODONE-acetaminophen (PERCOCET) 7.5-325 MG tablet Take 1 tablet by mouth every 12 (twelve) hours. 60 tablet 0   pantoprazole (PROTONIX) 20 MG tablet TAKE 1 TABLET BY MOUTH EVERY DAY 30 tablet 5   sodium polystyrene (SPS) 15 GM/60ML suspension TAKE 60 MLS BY MOUTH ONCE WEEKLY TO KEEP POTASSIUM DOWN 240 mL 0   SYMBICORT 160-4.5 MCG/ACT inhaler Inhale 2 puffs by mouth twice daily 11 g 0   torsemide (DEMADEX) 20 MG tablet TAKE 1 TABLET BY MOUTH EVERY DAY. TAKE AN EXTRA DOSE FOR WEIGHT GAIN, 3LB IN 1 DAY OR 5LB IN 1 WEEK 30 tablet 0   Vitamin D, Ergocalciferol, (DRISDOL) 1.25 MG (50000 UNIT) CAPS capsule TAKE 1 CAPSULE BY MOUTH every 7 days 4 capsule 1   No facility-administered medications prior to visit.    Family History  Problem Relation Age of Onset   Breast cancer Mother 74   Heart disease Mother    Throat cancer Father    Hypertension Father  Arthritis Father    Diabetes Father    Arthritis Sister    Obesity Sister    Diabetes Sister    Heart disease Cousin    Colon cancer Neg Hx    Stomach cancer Neg Hx    Esophageal cancer Neg Hx     Social History   Socioeconomic History   Marital status: Widowed    Spouse name: Not on file   Number of children: 3   Years of education: Not on file   Highest education level: High school graduate  Occupational History   Occupation: retired  Tobacco Use   Smoking status: Never    Passive exposure: Past   Smokeless tobacco: Never   Tobacco comments:    Both parents smoked, patient was exposed/ "Raised up in smoke, my whole life."  Vaping Use   Vaping Use: Never used  Substance and Sexual Activity   Alcohol use: No    Alcohol/week: 0.0 standard drinks of alcohol   Drug use: No    Sexual activity: Not Currently  Other Topics Concern   Not on file  Social History Narrative   Not on file   Social Determinants of Health   Financial Resource Strain: Low Risk  (06/05/2022)   Overall Financial Resource Strain (CARDIA)    Difficulty of Paying Living Expenses: Not hard at all  Food Insecurity: No Food Insecurity (06/05/2022)   Hunger Vital Sign    Worried About Running Out of Food in the Last Year: Never true    Ran Out of Food in the Last Year: Never true  Transportation Needs: No Transportation Needs (06/05/2022)   PRAPARE - Hydrologist (Medical): No    Lack of Transportation (Non-Medical): No  Physical Activity: Insufficiently Active (06/05/2022)   Exercise Vital Sign    Days of Exercise per Week: 3 days    Minutes of Exercise per Session: 10 min  Stress: Stress Concern Present (06/05/2022)   [place]    Feeling of Stress : Rather much  Social Connections: Moderately Isolated (04/20/2022)   Social Connection and Isolation Panel [NHANES]    Frequency of Communication with Friends and Family: Once a week    Frequency of Social Gatherings with Friends and Family: Once a week    Attends Religious Services: 1 to 4 times per year    Active Member of [employer] or Organizations: Yes    Attends Archivist Meetings: 1 to 4 times per year    Marital Status: Widowed  Intimate Partner Violence: Not At Risk (04/20/2022)   Humiliation, Afraid, Rape, and Kick questionnaire    Fear of Current or Ex-Partner: No    Emotionally Abused: No    Physically Abused: No    Sexually Abused: No                                                                                                 Objective:  Physical Exam: BP 130/82 (BP Location: Left Arm, Patient Position: Sitting, Cuff Size: Large)   Pulse 79  Temp 97.6 F (36.4 C) (Temporal)   Wt 282 lb (127.9 kg)   SpO2 94%   BMI  44.17 kg/m    General: No acute distress. Awake and conversant.  Wheelchair-bound Eyes: Normal conjunctiva, anicteric. Round symmetric pupils.  ENT: Hearing grossly intact. No nasal discharge.  Neck: Neck is supple. No masses or thyromegaly.  Respiratory: Respirations are non-labored. No auditory wheezing.  Skin: Warm. No rashes or ulcers.  Psych: Alert and oriented. Cooperative, Appropriate mood and affect, Normal judgment.  CV: Normal S1-S2, RRR, MRG, chest wall tender to palpation, no overlying bruises ,no cyanosis or JVD MSK: No lower extremity edema, moves all extremities spontaneously  ABD: Nontender nondistended  neuro: Sensation and CN II-XII grossly normal.   LABS:  CMP, CBC, iron panel, and recent glucose levels reviewed. Notable for elevated blood glucose and hemoglobin A1c. Anemia noted but stable. Kidney functions are consistent with chronic baseline elevations.  IMAGING:  Recent screenings, including mammogram and imaging of hip and lumbar spine, reviewed. No acute findings related to current chief complaint.  Recent Results (from the past 2160 hour(s))  Basic metabolic panel     Status: Abnormal   Collection Time: 05/05/22  4:24 PM  Result Value Ref Range   Glucose, Bld 360 (H) 65 - 99 mg/dL    Comment: .            Fasting reference interval . For someone without known diabetes, a glucose value >125 mg/dL indicates that they may have diabetes and this should be confirmed with a follow-up test. .    BUN 46 (H) 7 - 25 mg/dL   Creat 1.53 (H) 0.60 - 0.95 mg/dL   BUN/Creatinine Ratio 30 (H) 6 - 22 (calc)   Sodium 137 135 - 146 mmol/L   Potassium 4.5 3.5 - 5.3 mmol/L   Chloride 100 98 - 110 mmol/L   CO2 26 20 - 32 mmol/L   Calcium 8.5 (L) 8.6 - 10.4 mg/dL  CBC     Status: Abnormal   Collection Time: 05/05/22  4:24 PM  Result Value Ref Range   WBC 14.4 (H) 3.8 - 10.8 Thousand/uL   RBC 2.90 (L) 3.80 - 5.10 Million/uL   Hemoglobin 8.5 (L) 11.7 - 15.5 g/dL    HCT 26.5 (L) 35.0 - 45.0 %   MCV 91.4 80.0 - 100.0 fL   MCH 29.3 27.0 - 33.0 pg   MCHC 32.1 32.0 - 36.0 g/dL   RDW 12.3 11.0 - 15.0 %   Platelets 266 140 - 400 Thousand/uL   MPV 11.8 7.5 - 12.5 fL  Folate     Status: None   Collection Time: 05/05/22  4:24 PM  Result Value Ref Range   Folate >24.0 ng/mL    Comment:                            Reference Range                            Low:           <3.4                            Borderline:    3.4-5.4  Normal:        >5.4 .   Iron, TIBC and Ferritin Panel     Status: Abnormal   Collection Time: 05/05/22  4:24 PM  Result Value Ref Range   Iron 33 (L) 45 - 160 mcg/dL   TIBC 193 (L) 250 - 450 mcg/dL (calc)   %SAT 17 16 - 45 % (calc)   Ferritin 254 16 - 288 ng/mL  Vitamin B12     Status: None   Collection Time: 05/05/22  4:24 PM  Result Value Ref Range   Vitamin B-12 484 200 - 1,100 pg/mL  Iron and Iron Binding Capacity ([facility] only)     Status: Abnormal   Collection Time: 07/11/22 12:53 PM  Result Value Ref Range   Iron 27 (L) 28 - 170 ug/dL   TIBC 239 (L) 250 - 450 ug/dL   Saturation Ratios 11 10.4 - 31.8 %   UIBC 212 148 - 442 ug/dL    Comment: Performed at [facility], [place] [address] [place], [place] [address]  Ferritin     Status: None   Collection Time: 07/11/22 12:53 PM  Result Value Ref Range   Ferritin 281 11 - 307 ng/mL    Comment: Performed at [facility], [address] [place], [place] [address]  CMP ([place] only)     Status: Abnormal   Collection Time: 07/11/22 12:53 PM  Result Value Ref Range   Sodium 139 135 - 145 mmol/L   Potassium 5.1 3.5 - 5.1 mmol/L   Chloride 103 98 - 111 mmol/L   CO2 28 22 - 32 mmol/L   Glucose, Bld 175 (H) 70 - 99 mg/dL    Comment: Glucose reference range applies only to samples taken after fasting for at least 8 hours.   BUN 56 (H) 8 - 23 mg/dL   Creatinine 1.97 (H) 0.44 - 1.00 mg/dL   Calcium  9.0 8.9 - 10.3 mg/dL   Total Protein 7.7 6.5 - 8.1 g/dL   Albumin 3.4 (L) 3.5 - 5.0 g/dL   AST 10 (L) 15 - 41 U/L   ALT 8 0 - 44 U/L   Alkaline Phosphatase 96 38 - 126 U/L   Total Bilirubin 0.4 0.3 - 1.2 mg/dL   GFR, Estimated 24 (L) >60 mL/min    Comment: (NOTE) Calculated using the CKD-EPI Creatinine Equation (2021)    Anion gap 8 5 - 15    Comment: Performed at [facility], [place] [address], [place], [place] [address]  CBC with Differential ([place] Only)     Status: Abnormal   Collection Time: 07/11/22 12:53 PM  Result Value Ref Range   WBC Count 13.0 (H) 4.0 - 10.5 K/uL   RBC 3.00 (L) 3.87 - 5.11 MIL/uL   Hemoglobin 8.7 (L) 12.0 - 15.0 g/dL   HCT 27.9 (L) 36.0 - 46.0 %   MCV 93.0 80.0 - 100.0 fL   MCH 29.0 26.0 - 34.0 pg   MCHC 31.2 30.0 - 36.0 g/dL   RDW 14.2 11.5 - 15.5 %   Platelet Count 251 150 - 400 K/uL   nRBC 0.0 0.0 - 0.2 %   Neutrophils Relative % 73 %   Neutro Abs 9.4 (H) 1.7 - 7.7 K/uL   Lymphocytes Relative 19 %   Lymphs Abs 2.5 0.7 - 4.0 K/uL   Monocytes Relative 5 %   Monocytes Absolute 0.7 0.1 - 1.0 K/uL   Eosinophils Relative 3 %  Eosinophils Absolute 0.4 0.0 - 0.5 K/uL   Basophils Relative 0 %   Basophils Absolute 0.0 0.0 - 0.1 K/uL   Immature Granulocytes 0 %   Abs Immature Granulocytes 0.05 0.00 - 0.07 K/uL    Comment: Performed at [facility], [place] [address], [place],  [address]   DG Chest 2 View  Result Date: 07/12/2022 CLINICAL DATA:  88 year old female with shortness of breath EXAM: CHEST - 2 VIEW COMPARISON:  03/26/2022 FINDINGS: Cardiomediastinal silhouette unchanged in size and contour. No evidence of central vascular congestion. No interlobular septal thickening. Low lung volumes persist with coarsened interstitial markings throughout. Linear atelectasis/scarring at the lung bases. No pneumothorax or pleural effusion. No acute displaced fracture. Degenerative changes of the spine.  IMPRESSION: Low lung volumes without evidence of acute cardiopulmonary disease Electronically Signed   By: Corrie Mckusick D.O.   On: 07/12/2022 16:10   MM 3D SCREEN BREAST BILATERAL  Result Date: 07/11/2022 CLINICAL DATA:  Screening. EXAM: DIGITAL SCREENING BILATERAL MAMMOGRAM WITH TOMOSYNTHESIS AND CAD TECHNIQUE: Bilateral screening digital craniocaudal and mediolateral oblique mammograms were obtained. Bilateral screening digital breast tomosynthesis was performed. The images were evaluated with computer-aided detection. COMPARISON:  Previous exam(s). ACR Breast Density Category a: The breasts are almost entirely fatty. FINDINGS: There are no findings suspicious for malignancy. IMPRESSION: No mammographic evidence of malignancy. A result letter of this screening mammogram will be mailed directly to the patient. RECOMMENDATION: Screening mammogram in one year. (Code:SM-B-01Y) BI-RADS CATEGORY  1: Negative. Electronically Signed   By: Nolon Nations M.D.   On: 07/11/2022 10:04   CT Hip Left Wo Contrast  Result Date: 06/04/2022 CLINICAL DATA:  Left hip pain for 1 week, no known injury, initial encounter EXAM: CT OF THE LEFT HIP WITHOUT CONTRAST TECHNIQUE: Multidetector CT imaging of the left hip was performed according to the standard protocol. Multiplanar CT image reconstructions were also generated. RADIATION DOSE REDUCTION: This exam was performed according to the departmental dose-optimization program which includes automated exposure control, adjustment of the mA and/or kV according to patient size and/or use of iterative reconstruction technique. COMPARISON:  None Available. FINDINGS: Bones/Joint/Cartilage Generalized osteopenia is noted. Left hip is well seated in the acetabulum. No acute fracture is identified. Ligaments Suboptimally assessed by CT. Muscles and Tendons Surrounding musculature appears within normal limits. Soft tissues A small fluid collection is noted along the lateral aspect of the  greater trochanter which may be related to recent injury. No definitive hematoma is seen. No other soft tissue abnormality is noted. IMPRESSION: No acute bony abnormality is noted.  Generalized osteopenia is seen. Small fluid collection is noted along the lateral aspect of the greater trochanter which may be related to recent injury. Electronically Signed   By: Inez Catalina M.D.   On: 06/04/2022 18:22   CT Lumbar Spine Wo Contrast  Result Date: 06/04/2022 CLINICAL DATA:  Left hip and leg pain for 1 week. EXAM: CT LUMBAR SPINE WITHOUT CONTRAST TECHNIQUE: Multidetector CT imaging of the lumbar spine was performed without intravenous contrast administration. Multiplanar CT image reconstructions were also generated. RADIATION DOSE REDUCTION: This exam was performed according to the departmental dose-optimization program which includes automated exposure control, adjustment of the mA and/or kV according to patient size and/or use of iterative reconstruction technique. COMPARISON:  CT of the abdomen and pelvis 03/26/2022 FINDINGS: Segmentation: 5 non rib-bearing lumbar type vertebral bodies are present. The lowest fully formed vertebral body is L5. Alignment: No significant listhesis is present. Straightening of  the normal lumbar lordosis is stable. Vertebrae: Vertebral body heights are maintained. Schmorl's nodes at superior endplate of L4 and at 075-GRM are stable. No acute or healing fractures are present. Visualized sacrum and pelvis demonstrate no acute abnormality. Degenerative changes are noted at the SI joints bilaterally. Paraspinal and other soft tissues: Bilateral renal cysts are again noted. No imaging follow-up recommended. Atherosclerotic changes are present in the aorta without aneurysm. No significant adenopathy is present. Disc levels: T12-L1: Bilateral facet hypertrophy is present. No significant stenosis is present. L1-2: Bilateral facet hypertrophy is present. No significant disc protrusion or  stenosis is present. L2-3: Vacuum disc noted. A broad-based disc protrusion and advanced facet hypertrophy contribute to moderate to severe central canal stenosis left greater than right foraminal narrowing may contribute to left hip pain. L3-4: A leftward disc protrusion is present. Moderate facet hypertrophy is noted bilaterally. Mild to moderate left subarticular and foraminal stenosis is present. L4-5: A broad-based disc protrusion is present. Chronic loss of disc height is present. Advanced facet hypertrophy is noted. Moderate central and left foraminal stenosis is present. L5-S1: A rightward disc protrusion is present. Asymmetric right-sided facet hypertrophy is noted. Moderate to severe right and moderate left foraminal stenosis is present. IMPRESSION: 1. No acute abnormality. 2. Multilevel spondylosis of the lumbar spine as described, similar to the prior exams. 3. Left-sided disease is greatest at L2-3 which could contribute to left hip pain. Electronically Signed   By: San Morelle M.D.   On: 06/04/2022 18:21   DG Hip Unilat W or Wo Pelvis 2-3 Views Left  Result Date: 06/04/2022 CLINICAL DATA:  Hip pain. EXAM: DG HIP (WITH OR WITHOUT PELVIS) 2-3V LEFT COMPARISON:  None Available. FINDINGS: Vascular calcifications. No fracture or dislocation. No cause for acute hip pain identified. IMPRESSION: Vascular calcifications. No cause for acute hip pain identified. Electronically Signed   By: Dorise Bullion III M.D.   On: 06/04/2022 15:18         Alesia Banda, MD, MS

## 2022-07-12 NOTE — Patient Instructions (Signed)
Please call the care guide team at [phone] if you need to cancel or reschedule your appointment.   If you are experiencing a Mental Health or [place] or need someone to talk to, please call the Suicide and Crisis Lifeline: 988 call the [place] National Suicide Prevention Lifeline: 775-306-6555 or TTY: (669)157-1632 TTY (262)731-9376) to talk to a trained counselor call 1-800-273-TALK (toll free, 24 hour hotline) go to [facility] [address] [place] [phone]) call 911   Following is a copy of the CCM Program Consent:  CCM service includes personalized support from designated clinical staff supervised by the physician, including individualized plan of care and coordination with other care providers 24/7 contact phone numbers for assistance for urgent and routine care needs. Service will only be billed when office clinical staff spend 20 minutes or more in a month to coordinate care. Only one practitioner may furnish and bill the service in a calendar month. The patient may stop CCM services at amy time (effective at the end of the month) by phone call to the office staff. The patient will be responsible for cost sharing (co-pay) or up to 20% of the service fee (after annual deductible is met)  Following is a copy of your full provider care plan:   Goals Addressed             This Visit's Progress    CCM (DIABETES) EXPECTED OUTCOME:  MONITOR, SELF-MANAGE AND REDUCE SYMPTOMS OF DIABETES       Current Barriers:  Knowledge Deficits related to Diabetes management Chronic Disease Management support and education needs related to Diabetes and diet Cognitive Deficits Per patient's daughter Liz Malady, CBG is monitored with [employer]estyle [employer], states fasting ranges have been in low 100's with no further hypoglycemic episodes, due to Toujeo being decreased from 42 units down to 40, pt has glucose tablets on hand, is trying to eat 3  meals per day plus snack at bedtime. Daughter reports there will be a home visit on 07/18/22 at 1230 pm to discuss Hospice/ palliative care options. Daughter reports pt is taking Trazodone 50 mg at hs and this is helping with sleep  Planned Interventions: Reviewed medications with patient and discussed importance of medication adherence;        Counseled on importance of regular laboratory monitoring as prescribed;        Advised patient, providing education and rationale, to check cbg per CGM [facility] [place]  and record        call provider for findings outside established parameters;       Review of patient status, including review of consultants reports, relevant laboratory and other test results, and medications completed;       Advised patient to discuss any issues with blood sugar, hypoglycemia with provider;      Reinforced importance of eating 3 meals per day Reinforced carbohydrate modified diet Reviewed upcoming scheduled appointments including primary care provider on 07/12/22 at 2 pm  Symptom Management: Take medications as prescribed   Attend all scheduled provider appointments Call pharmacy for medication refills 3-7 days in advance of running out of medications Attend church or other social activities Perform all self care activities independently  Perform IADL's (shopping, preparing meals, housekeeping, managing finances) independently Call provider office for new concerns or questions  check blood sugar at prescribed times: Freestyle LIbre  check feet daily for cuts, sores or redness enter blood sugar readings and medication or insulin into daily log take  the blood sugar log to all doctor visits take the blood sugar meter to all doctor visits trim toenails straight across fill half of plate with vegetables limit fast food meals to no more than 1 per week manage portion size prepare main meal at home 3 to 5 days each week read food labels for fat, fiber, carbohydrates and  portion size keep feet up while sitting wash and dry feet carefully every day Try eating a snack at hs with protein and carbohydrate Please stay in contact with your doctor if you have any issues with blood sugar, hypoglycemia (low blood sugar) Follow up with primary care provider on 07/12/22 at 2 pm Follow RULE OF 15 for low blood sugar management:  How to treat low blood sugars (Blood sugar less than 70 mg/dl  Please follow the RULE OF 15 for the treatment of hypoglycemia treatment (When your blood sugars are less than 70 mg/ dl) STEP  1:  Take 15 grams of carbohydrates when your blood sugar is low, which includes:   3-4 glucose tabs or  3-4 oz of juice or regular soda or  One tube of glucose gel STEP 2:  Recheck blood sugar in 15 minutes STEP 3:  If your blood sugar is still low at the 15 minute recheck ---then, go back to STEP 1 and treat again with another 15 grams of carbohydrates  Follow Up Plan: Telephone follow up appointment with care management team member scheduled for:  10/12/22 at 9 am       CCM (HYPERTENSION) EXPECTED OUTCOME: MONITOR, SELF-MANAGE AND REDUCE SYMPTOMS OF HYPERTENSION       Current Barriers:  Knowledge Deficits related to Hypertension management Chronic Disease Management support and education needs related to Hypertension Cognitive Deficits-dementia Spoke with patient's daughter Liz Malady who lives with patient, reports patient has "mild dementia", states pt needs assistance with all ADL's, IADL's, pt has WC, walker, shower seat and stays in bed a lot, had home health PT for strengthening, states pt is trying to do chair and bed exercises prescribed by physical therapist.  [name] is primary caregiver,  pt has another daughter that provides meals, pt refuses PACE program.  [name] denies any needs for social work at present.  Planned Interventions: Evaluation of current treatment plan related to hypertension self management and patient's adherence to plan as established  by provider;   Reviewed medications with patient and discussed importance of compliance;  Advised patient, providing education and rationale, to monitor blood pressure daily and record, calling PCP for findings outside established parameters;  Advised patient to discuss any issues with blood pressure or medications with provider; Reinforced importance of adherence to low sodium diet and reading food labels Reviewed importance of changing positions q 2 hours  Symptom Management: Take medications as prescribed   Attend all scheduled provider appointments Call pharmacy for medication refills 3-7 days in advance of running out of medications Attend church or other social activities Perform all self care activities independently  Perform IADL's (shopping, preparing meals, housekeeping, managing finances) independently Call provider office for new concerns or questions  check blood pressure weekly choose a place to take my blood pressure (home, clinic or office, retail store) write blood pressure results in a log or diary learn about high blood pressure keep a blood pressure log take blood pressure log to all doctor appointments call doctor for signs and symptoms of high blood pressure keep all doctor appointments take medications for blood pressure exactly as prescribed report new  symptoms to your doctor eat more whole grains, fruits and vegetables, lean meats and healthy fats Follow low sodium diet- read labels, limit/ avoid fast food Continue completing chair/ bed exercises prescribed by physical therapy Change positions every 2 hours  Follow Up Plan: Telephone follow up appointment with care management team member scheduled for:  10/12/22 at 9 am          Patient verbalizes understanding of instructions and care plan provided today and agrees to view in [place]. Active MyChart status and patient understanding of how to access instructions and care plan via MyChart confirmed with  patient.     Telephone follow up appointment with care management team member scheduled for:  10/12/22 at 9 am

## 2022-07-13 DIAGNOSIS — G5602 Carpal tunnel syndrome, left upper limb: Secondary | ICD-10-CM | POA: Diagnosis not present

## 2022-07-13 NOTE — Assessment & Plan Note (Signed)
[name] longstanding anxiety and insomnia are potentially worsening; impact on sleep quality is significant. Concern regarding the impact of these conditions on her cardiopulmonary status necessitates careful consideration of any medication adjustments.  Plan:  Postpone any changes to medication for anxiety until after the cardiopulmonary evaluation due to potential respiratory compromise. Schedule a virtual follow-up visit specifically to address anxiety and insomnia management after cardiology evaluation. Explore non-pharmacologic interventions such as cognitive-behavioral therapy for insomnia (CBT-I) as adjunctive therapy.

## 2022-07-13 NOTE — Assessment & Plan Note (Signed)
[name] presenting symptoms of dyspnea worsening over the past week, particularly when lying flat, along with new chest pain, raise concerns for possible cardiac etiology or exacerbation of chronic conditions such as chronic heart failure or COPD.  Differential diagnosis: Acute exacerbation of chronic heart failure (CHF) is less likely due to the patient's recent weight loss and increased diuresis after starting additional torsemide. COPD exacerbation should be considered given the patient's history, although there has been no response to nebulizers at home. Myocardial ischemia or unstable angina could explain the new occurrence of chest pain. Pulmonary embolism remains a consideration despite the absence of a typical presentation and the patient being on anticoagulation therapy.  Plan: Order a chest X-ray to check for possible fluid in the lungs and exclude pneumonia. Refer to the cardiology department for further evaluation regarding escalated shortness of breath and new chest pain. Advise the patient to attempt nitroglycerin should chest pain recur and to report results. Maintain current medication regimen, including the continuation of Eliquis for anticoagulation.

## 2022-07-13 NOTE — Assessment & Plan Note (Deleted)
[name] anxiety and insomnia are longstanding but possibly worsening; they are contributing to her difficulty sleeping. Medication for these conditions must be weighed against potential respiratory implications.  Plan:  Postpone any adjustments to anxiolytic medications until cardiac and respiratory status are clarified, considering the risk of respiratory compromise with increased dosages. Plan for a virtual follow-up regarding managing anxiety and insomnia if cardiac and respiratory evaluations return unremarkable.

## 2022-07-13 NOTE — Assessment & Plan Note (Deleted)
The shortness of breath and new, episodic chest pain raise concern for cardiac origins or exacerbation of underlying chronic conditions including chronic heart failure or COPD.  Differential diagnosis:  Acute exacerbation of chronic heart failure (Less likely as patient has had decrease in weight since last OV, and she has started taking increase torsemide as instructed from telemedicine visit earlier last week) COPD exacerbation. Myocardial ischemia or angina. Pulmonary embolism.  Plan:  Order a chest X-ray to check for possible fluid in the lungs and exclude pneumonia. Refer to the cardiology department for further evaluation regarding escalated shortness of breath and new chest pain. Advise patient to attempt nitroglycerin should chest pain recur and report results. Attempted several times but unable to obtain ECG,  Office visit and ED precautions discussed including worsening chest pain or shortness of breath

## 2022-07-15 NOTE — Progress Notes (Unsigned)
Cardiology Office Note:    Date:  07/15/2022   ID:  [name], DOB 07/25/1934, MRN [id number]  PCP:  Bonnita Hollow, MD   [facility] Providers Cardiologist:  None {   Referring MD: Bonnita Hollow, MD    History of Present Illness:    [name] is a 88 y.o. female with a hx of dementia, CAD (BMS to RCA 2004, DESx2 to mid and proximal RCA 2012, recent NSTEMI 02/2020 s/p DES to RCA), chronic diastolic CHF, DM, morbid obesity, remote gastric bypass, PMR, HLD, depression, OCD, anxiety, COPD, prior PE/DVT, CKD stage III, breast CA who presents to clinic for follow-up.  Per review of the record, the patient has history of multiple medical problems as outlined above. She was admitted 02/2020 with chest pain/NSTEMI and underwent cath showing multivessel CAD but severe ISR in pRCA and m/dRCA lesion both treated with DES. There was diffuse mild to moderate disease in the LM as well as Lcx and LAD but nonobstructive and treated medically. LVEF was normal, borderline elevated LVEDP. 2D echo was technically difficult given habitus. Hospital course also notable for anemia requiring blood transfusion (has prior h/o anemia). IM discussed case with Dr. Irene Limbo, her oncologist, regarding ongoing maintenance Xarelto for prior h/o PE/DVT and the mutual decision was made to hold further Xarelto given need for DAPT and history of anemia.   Was seen in 02/2020 with [name] where she was tachycardic with elevated D-dimer. V/Q scan obtained without evidence of PE.  Was admitted 10/13/21 with acute on chronic HFpEF. Diuresed with IV lasix and later transitioned to lasix 40 mg po daily. Discharge weight 315 pounds. Discharged 10/22/21.   Was seen in clinic by [name], NP on 10/25/21 where she was wheelchair bound and required assistance with all ADLs. She was continued on lasix '40mg'$  daily with extra dose as needed.  Was seen in the ER 03/2022 for DVT and was started on apixaban. Represented later that  month with melena and chest pain. V/Q scan positive for PE. She was maintained on apixaban.  Was last seen in clinic on 05/2022 by [name] where she was stable from a CV standpoint.   Today, ***    Past Medical History:  Diagnosis Date   Abdominal pain 01/26/2012   Acute kidney injury superimposed on chronic kidney disease ([place]) 12/18/2014   Allergy    takes Mucinex daily as needed   Anemia, unspecified    Anxiety    takes Clonazepam daily as needed   Anxiety associated with depression 06/08/2017   Arthritis    Breast cancer ([place])    Breast cancer screening 02/19/2014   Breast mass 10/27/2014   Chronic diastolic CHF (congestive heart failure) ([place])    takes Furosemide daily   Chronic pain syndrome 02/06/2015   CKD (chronic kidney disease), stage III ([place])    Coronary artery disease    a. s/p IMI 2004 tx with BMS to RCA;  b. s/p Promus DES to RCA 2/12 c. abnormal nuc 2016 -> cath with med rx. d. NSTEMI 02/2020  mv CAD with severe ISR in pRCA and m/dRCA lesion both treated with DES.   Debility 08/01/2012   Dementia ([place])    Depression    takes Cymbalta daily   Diabetes mellitus    insulin daily   DOE (dyspnea on exertion) 06/24/2014   Dysphagia, unspecified(787.20) 07/31/2012   Fungal dermatitis 11/13/2007   Qualifier: Diagnosis of  By: Burnice Logan  MD, Doretha Sou  GERD (gastroesophageal reflux disease)    takes Protonix daily   Gout, unspecified    Headache    occasionally   History of blood clots    History of deep venous thrombosis 01/27/2012   History of pulmonary embolus (PE) 04/22/2014   Hyperkalemia 07/26/2017   Hyperlipidemia    takes Pravastatin daily   Hypertension    Insomnia    Joint pain    Joint swelling    Morbid obesity ([place])    Multiple benign nevi 11/15/2016   Myocardial infarction [facility]) 2004   Non-STEMI (non-ST elevated myocardial infarction) ([place]) 02/15/2020   Numbness    Obstructive sleep apnea    does not wear cpap   Osteoarthritis     Osteoarthritis    Osteoarthrosis, unspecified whether generalized or localized, lower leg    Pain in the chest 12/31/2013   Pain, chronic    Personal history of other diseases of the digestive system 12/03/2006   Qualifier: Diagnosis of  By: Burnice Logan  MD, Doretha Sou    Polymyalgia rheumatica [facility])    Presence of stent in right coronary artery    Pulmonary emboli ([place]) 01/2012   felt to need lifelong anticoagulation but Xarelto dc in 02/2020 due to need for DAPT   Situational depression 06/04/2009   Qualifier: Diagnosis of  By: Burnice Logan  MD, Doretha Sou    Syncope, vasovagal 07/04/2021   Type 2 diabetes mellitus with hyperlipidemia ([place]) 02/16/2022   Urinary incontinence    takes Linzess daily   Vaginitis and vulvovaginitis 06/23/2016   Vertigo    hx of;was taking Meclizine if needed   Wears dentures     Past Surgical History:  Procedure Laterality Date   ABDOMINAL HYSTERECTOMY     partial   APPENDECTOMY     blood clots/legs and lungs  2013   BREAST BIOPSY Left 07/22/2014   BREAST BIOPSY Left 02/10/2013   BREAST LUMPECTOMY Left 11/05/2014   BREAST LUMPECTOMY WITH RADIOACTIVE SEED LOCALIZATION Left 11/05/2014   Procedure: LEFT BREAST LUMPECTOMY WITH RADIOACTIVE SEED LOCALIZATION;  Surgeon: [name], MD;  Location: [place];  Service: General;  Laterality: Left;   CARDIAC CATHETERIZATION     COLONOSCOPY     CORONARY ANGIOPLASTY  2   CORONARY STENT INTERVENTION N/A 02/17/2020   Procedure: CORONARY STENT INTERVENTION;  Surgeon: Leonie Man, MD;  Location: [place] CV LAB;  Service: Cardiovascular;  Laterality: N/A;   ESOPHAGOGASTRODUODENOSCOPY (EGD) WITH PROPOFOL N/A 11/07/2016   Procedure: ESOPHAGOGASTRODUODENOSCOPY (EGD) WITH PROPOFOL;  Surgeon: Gatha Mayer, MD;  Location: [facility];  Service: Endoscopy;  Laterality: N/A;   EXCISION OF SKIN TAG Right 11/05/2014   Procedure: EXCISION OF RIGHT EYELID SKIN TAG;  Surgeon: [name], MD;  Location: [place];   Service: General;  Laterality: Right;   EYE SURGERY Bilateral    cataract    GASTRIC BYPASS  1977    reversed in 1979, [place] CATH AND CORONARY ANGIOGRAPHY N/A 02/17/2020   Procedure: LEFT HEART CATH AND CORONARY ANGIOGRAPHY;  Surgeon: Leonie Man, MD;  Location: [place] [facility];  Service: Cardiovascular;  Laterality: N/A;   LEFT HEART CATHETERIZATION WITH CORONARY ANGIOGRAM N/A 06/29/2014   Procedure: LEFT HEART CATHETERIZATION WITH CORONARY ANGIOGRAM;  Surgeon: Troy Sine, MD;  Location: [facility] CATH LAB;  Service: Cardiovascular;  Laterality: N/A;   MEMBRANE PEEL Right 10/23/2018   Procedure: MEMBRANE PEEL;  Surgeon: Hurman Horn, MD;  Location: [place];  Service: Ophthalmology;  Laterality: Right;   MI with stent placement  2004   PARS PLANA VITRECTOMY Right 10/23/2018   Procedure: PARS PLANA VITRECTOMY WITH 25 GAUGE;  Surgeon: Hurman Horn, MD;  Location: [place];  Service: Ophthalmology;  Laterality: Right;    Current Medications: No outpatient medications have been marked as taking for the 07/17/22 encounter (Appointment) with Freada Bergeron, MD.     Allergies:   Sulfonamide derivatives, Lokelma [sodium zirconium cyclosilicate], Aricept [donepezil hcl], and Tramadol   Social History   Socioeconomic History   Marital status: Widowed    Spouse name: Not on file   Number of children: 3   Years of education: Not on file   Highest education level: High school graduate  Occupational History   Occupation: retired  Tobacco Use   Smoking status: Never    Passive exposure: Past   Smokeless tobacco: Never   Tobacco comments:    Both parents smoked, patient was exposed/ "Raised up in smoke, my whole life."  Vaping Use   Vaping Use: Never used  Substance and Sexual Activity   Alcohol use: No    Alcohol/week: 0.0 standard drinks of alcohol   Drug use: No   Sexual activity: Not Currently  Other Topics Concern   Not on file  Social History Narrative    Not on file   Social Determinants of Health   Financial Resource Strain: Low Risk  (06/05/2022)   Overall Financial Resource Strain (CARDIA)    Difficulty of Paying Living Expenses: Not hard at all  Food Insecurity: No Food Insecurity (06/05/2022)   Hunger Vital Sign    Worried About Running Out of Food in the Last Year: Never true    Ran Out of Food in the Last Year: Never true  Transportation Needs: No Transportation Needs (06/05/2022)   PRAPARE - Hydrologist (Medical): No    Lack of Transportation (Non-Medical): No  Physical Activity: Insufficiently Active (06/05/2022)   Exercise Vital Sign    Days of Exercise per Week: 3 days    Minutes of Exercise per Session: 10 min  Stress: Stress Concern Present (06/05/2022)   [place]    Feeling of Stress : Rather much  Social Connections: Moderately Isolated (04/20/2022)   Social Connection and Isolation Panel [NHANES]    Frequency of Communication with Friends and Family: Once a week    Frequency of Social Gatherings with Friends and Family: Once a week    Attends Religious Services: 1 to 4 times per year    Active Member of [employer] or Organizations: Yes    Attends Archivist Meetings: 1 to 4 times per year    Marital Status: Widowed     Family History: [name] family history includes Arthritis in her father and sister; Breast cancer (age of onset: 57) in her mother; Diabetes in her father and sister; Heart disease in her cousin and mother; Hypertension in her father; Obesity in her sister; Throat cancer in her father. There is no history of Colon cancer, Stomach cancer, or Esophageal cancer.  ROS:   Please see the history of present illness.    Review of Systems  Constitutional:  Negative for fever.  HENT:  Negative for sore throat.   Eyes:  Negative for blurred vision.  Respiratory:  Positive for shortness of breath.  Negative for cough.   Cardiovascular:  Negative for chest pain, palpitations, orthopnea, claudication, leg swelling and  PND.  Gastrointestinal:  Positive for abdominal pain (left). Negative for blood in stool, diarrhea and melena.  Genitourinary:  Negative for hematuria.  Musculoskeletal:  Negative for falls.  Skin:  Negative for itching.  Neurological:  Negative for dizziness, loss of consciousness and headaches.  Endo/Heme/Allergies:  Negative for environmental allergies. Does not bruise/bleed easily.  Psychiatric/Behavioral:  Negative for depression.      EKGs/Labs/Other Studies Reviewed:    The following studies were reviewed today:  Echo 07/05/21 1. Left ventricular ejection fraction, by estimation, is 55 to 60%. The  left ventricle has normal function. Left ventricular endocardial border  not optimally defined to evaluate regional wall motion- there are no wall  motion abnormalities seen in views  obtained. Left ventricular diastolic parameters are consistent with Grade  I diastolic dysfunction (impaired relaxation).   2. Right ventricular systolic function is normal. The right ventricular  size is normal.   3. The mitral valve was not well visualized. No evidence of mitral valve  regurgitation. No evidence of mitral stenosis.   4. The aortic valve was not well visualized. Aortic valve regurgitation  is not visualized.   Comparison(s): Difficult comparison- this is a technically difficult  study.   Bilateral LE Venous Doppler 03/03/2020:  Summary:  RIGHT:  - There is no evidence of deep vein thrombosis in the lower extremity.  However, portions of this examination were limited- see technologist  comments above.     - No cystic structure found in the popliteal fossa.     LEFT:  - There is no evidence of deep vein thrombosis in the lower extremity.  However, portions of this examination were limited- see technologist  comments above.     - No cystic structure found in  the popliteal fossa.  L Heart Cath Q000111Q LV end diastolic pressure is mildly elevated. ------------------------------ Mid LM to Prox LAD lesion is 30% stenosed with 30% stenosed side branch in Ost Cx. Prox LAD to Mid LAD lesion is 45% stenosed with 25% stenosed side branch in 1st Diag. 1st Mrg lesion is 100% stenosed. ---------------- Colon Flattery RCA to Prox RCA stent is 10% stenosed. Prox RCA to Mid RCA stent is 5% stenosed. Prox RCA lesion is 95% stenosed, between the 2 old stents A drug-eluting stent was successfully placed using a STENT RESOLUTE ONYX 4.0X18 -> postdilated to 4.6-4.7 mm Post intervention, there is a 0% residual stenosis. ---------------- Mid RCA lesion is 85% stenosed after the second stent A drug-eluting stent was successfully placed using a STENT RESOLUTE ONYX 3.0X18 -> tapered post dilation from 4.2-3.3 mm Post intervention, there is a 0% residual stenosis.   SUMMARY Multivessel CAD with severe (95%) IntraStent stenosis in the proximal RCA and de novo stenosis distal to mid RCA stent (85%) -> both treated with DES PCI using Resolute Onyx DES stents (distal-3.0 mm x 18 mm with tapered post dilation 4.2-3.3 mm; proximal 4.0 mm 18 mm postdilated to 4.7 mm.) Diffuse mild to moderate calcified disease in the distal Left Main as well as ostial and proximal LCx and LAD with no severe stenoses. Borderline elevated LVEDP with severe systemic hypertension     RECOMMENDATIONS Return to nursing unit for ongoing care.  Okay to DC heparin. We will need to determine plus or minus the use of Xarelto plus Plavix.  For now would continue aspirin plus Plavix until discharge and then discontinue aspirin on discharge with Xarelto plus Plavix for 3-6 months. Continue to monitor anemia and other risk factors.  2D Echo 02/15/20  1. Technically difficult echo with poor image quality.   2. Left ventricular ejection fraction, by estimation, is 65 to 70%. The  left ventricle has normal  function. The left ventricle has no regional  wall motion abnormalities. Left ventricular diastolic parameters are  consistent with Grade I diastolic  dysfunction (impaired relaxation).   3. Right ventricular systolic function was not well visualized. The right  ventricular size is not well visualized.   4. The mitral valve was not well visualized. No evidence of mitral valve  regurgitation.   5. The aortic valve was not well visualized. Aortic valve regurgitation  is not visualized.    Cath Q000111Q LV end diastolic pressure is mildly elevated. ------------------------------ Mid LM to Prox LAD lesion is 30% stenosed with 30% stenosed side branch in Ost Cx. Prox LAD to Mid LAD lesion is 45% stenosed with 25% stenosed side branch in 1st Diag. 1st Mrg lesion is 100% stenosed. ---------------- Colon Flattery RCA to Prox RCA stent is 10% stenosed. Prox RCA to Mid RCA stent is 5% stenosed. Prox RCA lesion is 95% stenosed, between the 2 old stents A drug-eluting stent was successfully placed using a STENT RESOLUTE ONYX 4.0X18 -> postdilated to 4.6-4.7 mm Post intervention, there is a 0% residual stenosis. ---------------- Mid RCA lesion is 85% stenosed after the second stent A drug-eluting stent was successfully placed using a STENT RESOLUTE ONYX 3.0X18 -> tapered post dilation from 4.2-3.3 mm Post intervention, there is a 0% residual stenosis.   SUMMARY Multivessel CAD with severe (95%) IntraStent stenosis in the proximal RCA and de novo stenosis distal to mid RCA stent (85%) -> both treated with DES PCI using Resolute Onyx DES stents (distal-3.0 mm x 18 mm with tapered post dilation 4.2-3.3 mm; proximal 4.0 mm 18 mm postdilated to 4.7 mm.) Diffuse mild to moderate calcified disease in the distal Left Main as well as ostial and proximal LCx and LAD with no severe stenoses. Borderline elevated LVEDP with severe systemic hypertension     RECOMMENDATIONS Return to nursing unit for ongoing care.  Okay  to DC heparin. We will need to determine plus or minus the use of Xarelto plus Plavix.  For now would continue aspirin plus Plavix until discharge and then discontinue aspirin on discharge with Xarelto plus Plavix for 3-6 months. Continue to monitor anemia and other risk factors.  EKG:  EKG is personally reviewed.  02/08/2022: EKG was not ordered. 11/21/2021: Normal sinus rhythm. Rate 68 bpm 05/18/21: NSR, inferior q waves, HR75  Recent Labs: 03/26/2022: B Natriuretic Peptide 132.6 03/27/2022: Magnesium 1.2 07/11/2022: ALT 8; BUN 56; Creatinine 1.97; Hemoglobin 8.7; Platelet Count 251; Potassium 5.1; Sodium 139  Recent Lipid Panel    Component Value Date/Time   CHOL 97 01/26/2021 1405   CHOL 136 04/20/2015 1001   TRIG 109 01/26/2021 1405   HDL 36 (L) 01/26/2021 1405   HDL 40 04/20/2015 1001   CHOLHDL 2.7 01/26/2021 1405   VLDL 17 02/15/2020 0636   LDLCALC 42 01/26/2021 1405     Risk Assessment/Calculations:           Physical Exam:    VS:  There were no vitals taken for this visit.    Wt Readings from Last 3 Encounters:  07/12/22 282 lb (127.9 kg)  07/11/22 282 lb (127.9 kg)  07/05/22 287 lb (130.2 kg)     GEN:  Uncomfortable appearing, wheelchair bound HEENT: Normal NECK: No JVD; No carotid bruits CARDIAC: RRR, no murmurs, rubs,  gallops RESPIRATORY: CTAB, tachypneic ABDOMEN: Soft, non-tender, non-distended MUSCULOSKELETAL:  Warm, no edema SKIN: Warm and dry NEUROLOGIC:  Alert and oriented x 3 PSYCHIATRIC:  Normal affect   ASSESSMENT:    No diagnosis found.     PLAN:    In order of problems listed above:  #CAD with Prior NSTEMI 02/2020 s/p PCI to RCA: Had BMS to RCA 2004, DESx2 to mid and proximal RCA 2012, recent NSTEMI 02/2020 with ISR in [place] and mRCA s/p DES. Currently doing okay without anginal symptoms. -Not on ASA/plavix due to need for [facility] -Continue lipitor '80mg'$  daily -Continue coreg 12.'5mg'$  BID -Nitro prn  #Chronic Diastolic HF: Currently  appears euvolemic. Minimally mobile at this time and is mainly wheelchair bound.  -Continue torsemide '20mg'$  daily with additional dose as needed -Continue coreg 12.'5mg'$  BID -Low Na diet  #History of PE/DVT with recurrence: -Continue apixaban '5mg'$  BID  #HLD: -Continue lipitor '80mg'$  daily -LDL 42 on 01/2021  #HTN: Fairly well controlled, mainly 120-130s at home. -Continue coreg 12.'5mg'$  BID  #DMII on Insulin: A1C 8.5. -Management per PCP -Goal A1C <7  #Anemia:  Denies bleeding. HgB stable at 8.8. -Management per PCP  Follow Up: Recommend to present to the ED.She declined to go to [facility] by EMS. Daughter will drive her to [place].  Medication Adjustments/Labs and Tests Ordered: Current medicines are reviewed at length with the patient today.  Concerns regarding medicines are outlined above.   No orders of the defined types were placed in this encounter.  No orders of the defined types were placed in this encounter.   There are no Patient Instructions on file for this visit.   Signed, Freada Bergeron, MD  07/15/2022 12:46 PM    [place]

## 2022-07-17 ENCOUNTER — Ambulatory Visit: Payer: PPO | Attending: Cardiology | Admitting: Cardiology

## 2022-07-17 ENCOUNTER — Encounter: Payer: Self-pay | Admitting: Cardiology

## 2022-07-17 VITALS — BP 134/86 | HR 71 | Ht 67.0 in | Wt 282.0 lb

## 2022-07-17 DIAGNOSIS — Z86711 Personal history of pulmonary embolism: Secondary | ICD-10-CM

## 2022-07-17 DIAGNOSIS — I5032 Chronic diastolic (congestive) heart failure: Secondary | ICD-10-CM

## 2022-07-17 DIAGNOSIS — E119 Type 2 diabetes mellitus without complications: Secondary | ICD-10-CM | POA: Diagnosis not present

## 2022-07-17 DIAGNOSIS — I10 Essential (primary) hypertension: Secondary | ICD-10-CM

## 2022-07-17 DIAGNOSIS — I25118 Atherosclerotic heart disease of native coronary artery with other forms of angina pectoris: Secondary | ICD-10-CM | POA: Diagnosis not present

## 2022-07-17 NOTE — Patient Instructions (Signed)
Medication Instructions:   Your physician recommends that you continue on your current medications as directed. Please refer to the Current Medication list given to you today.  *If you need a refill on your cardiac medications before your next appointment, please call your pharmacy*     Follow-Up: At [facility], you and your health needs are our priority.  As part of our continuing mission to provide you with exceptional heart care, we have created designated Provider Care Teams.  These Care Teams include your primary Cardiologist (physician) and Advanced Practice Providers (APPs -  Physician Assistants and Nurse Practitioners) who all work together to provide you with the care you need, when you need it.  We recommend signing up for the patient portal called "MyChart".  Sign up information is provided on this After Visit Summary.  MyChart is used to connect with patients for Virtual Visits (Telemedicine).  Patients are able to view lab/test results, encounter notes, upcoming appointments, etc.  Non-urgent messages can be sent to your provider as well.   To learn more about what you can do with MyChart, go to [email or web address].    Your next appointment:   6 month(s)  Provider:   Dr. Pemberton   

## 2022-07-18 ENCOUNTER — Encounter: Payer: Self-pay | Admitting: Hematology

## 2022-07-18 ENCOUNTER — Other Ambulatory Visit: Payer: PPO

## 2022-07-18 ENCOUNTER — Encounter (HOSPITAL_COMMUNITY): Payer: Self-pay

## 2022-07-18 VITALS — BP 128/68 | HR 73 | Temp 97.0°F

## 2022-07-18 DIAGNOSIS — Z515 Encounter for palliative care: Secondary | ICD-10-CM

## 2022-07-19 NOTE — Progress Notes (Signed)
PATIENT NAME: [name] DOB: Jan 28, 1935 MRN: [id number]  PRIMARY CARE PROVIDER: Bonnita Hollow, MD  RESPONSIBLE PARTY:  Acct ID - Guarantor Home Phone Work Phone Relationship Acct Type  [id number] [name], [name]  Self P/F     [address] [place], [place] [address]   Palliative Care Follow Up Encounter Note   Completed home visit. Daughter [name] also present.    HISTORY OF PRESENT ILLNESS:    Respiratory: SOB on exertion; pt reports PCP will make an appt w/a specialist for pt due to scar tissue on the lungs  Cardiac: HR regular; no edema noted  Cognitive: alert and oriented to place and self; has Dementia; left Dementia booklet; daughter not feeling well today will discuss booklet resources on next visit   Appetite: pt reports her appetite has decreased and she eats 2 reduced meals daily; has a half eaten breakfast sandwich (reports it is too salty); drinks sugar free liquids  GI/GU: has irregular BMs; reports she had a BM last Thursday but not sure if at all this week   Mobility: reports ability to walk from the bed to the recliner  ADLs: dependent on caregivers   Sleeping Pattern: sleeps well but wakes up for bathroom  Pain: 7/10 pain to the arch of her L foot; takes oxycodone for effective pain control  Diabetes: takes blood sugar w/Libre 2; 159 at this time; pt reports BS is controlled  Palliative Care/ Hospice: RN explained role and purpose of palliative care including visit frequency. Also discussed benefits of hospice care as well as the differences between the two with patient.   Goals of Care: To stay in the home with caregivers  CODE STATUS: DNR  ADVANCED DIRECTIVES: N MOST FORM: Y PPS: 40%   PHYSICAL EXAM:   VITALS: Today's Vitals   07/18/22 1248  BP: 128/68  Pulse: 73  Temp: (!) 97 F (36.1 C)  TempSrc: Temporal  SpO2: 96%  PainSc: 7   PainLoc: Foot    LUNGS: clear to auscultation  CARDIAC: Cor RRR EXTREMITIES: no  edema SKIN: Skin color, texture, turgor normal. No rashes or lesions  NEURO: negative except for headaches, memory problems, and weakness      Nichole Rietz Georgann Housekeeper, LPN

## 2022-07-21 ENCOUNTER — Other Ambulatory Visit: Payer: Self-pay | Admitting: Family Medicine

## 2022-07-21 DIAGNOSIS — E875 Hyperkalemia: Secondary | ICD-10-CM

## 2022-07-21 NOTE — Telephone Encounter (Signed)
Chart supports rx. Last OV: 07/12/2022

## 2022-08-06 DIAGNOSIS — Z794 Long term (current) use of insulin: Secondary | ICD-10-CM

## 2022-08-06 DIAGNOSIS — E119 Type 2 diabetes mellitus without complications: Secondary | ICD-10-CM

## 2022-08-06 DIAGNOSIS — I10 Essential (primary) hypertension: Secondary | ICD-10-CM

## 2022-08-10 ENCOUNTER — Other Ambulatory Visit: Payer: Self-pay | Admitting: Family Medicine

## 2022-08-10 ENCOUNTER — Other Ambulatory Visit: Payer: Self-pay | Admitting: Hematology

## 2022-08-10 DIAGNOSIS — E1142 Type 2 diabetes mellitus with diabetic polyneuropathy: Secondary | ICD-10-CM

## 2022-08-10 NOTE — Telephone Encounter (Signed)
Chart supports rx. Last OV: 07/12/2022   Contacted patient's daughter, [name], to schedule patient for follow up mychart visit, per PCP previous note, to follow up on anxiety and insomnia management. If [name] calls back please schedule mychart visit for patient.

## 2022-08-15 ENCOUNTER — Encounter: Payer: Self-pay | Admitting: Internal Medicine

## 2022-08-15 ENCOUNTER — Ambulatory Visit (INDEPENDENT_AMBULATORY_CARE_PROVIDER_SITE_OTHER): Payer: PPO | Admitting: Internal Medicine

## 2022-08-15 ENCOUNTER — Ambulatory Visit: Payer: PPO | Admitting: Nurse Practitioner

## 2022-08-15 VITALS — BP 134/74 | HR 67 | Temp 98.0°F | Ht 67.0 in | Wt 283.8 lb

## 2022-08-15 DIAGNOSIS — R0602 Shortness of breath: Secondary | ICD-10-CM

## 2022-08-15 DIAGNOSIS — J9811 Atelectasis: Secondary | ICD-10-CM | POA: Diagnosis not present

## 2022-08-15 NOTE — Progress Notes (Signed)
[name]    [id number]    12/31/1934  Primary Care Physician:[name], MD  Referring Physician: [name], MD [address] [place],  [place] [address] Reason for Consultation: COPD Date of Consultation: 08/15/2022  Chief complaint:   Chief Complaint  Patient presents with   Consult    COPD, sob during exertion ( standing). Coughing, wheezing.   Using inhalers as needed.       HPI: [name] is a 88 y.o. woman who presents for new patient evaluation for shortness of breath. Symptoms started about a year ago, progressive. Dyspnea with minimal exertion.   She coughs up mucus sometimes, worse when she first wakes up in the morning. Sometimes int he evenings also. Uses cough syrup and drinks water and this goes away.   She does have wheezing with exertion. She has an albuterol inhaler which does help her breathing takes albuterol 1-2 times/day. Doesn't think symbicort helps.   She cannot walk due to arthritis in her knees. She lays on her right side to due pain.  Also on symbicort inhaler but she doesn't know when she takes it.   Denies snoring in her sleep.   No childhood respiratory disease, no asthma, recurrent pneumonia.   Unfortunately the patient is a poor historian and her daughter usually helps manage her medications but she could not be here today.   Social history:  Occupation: worked as Publishing rights manager.  Exposures: lives at home with daughter.  Smoking history: never smoker. Passive smoke exposure in childhood.   Social History   Occupational History   Occupation: retired  Tobacco Use   Smoking status: Never    Passive exposure: Past   Smokeless tobacco: Never   Tobacco comments:    Both parents smoked, patient was exposed/ "Raised up in smoke, my whole life."  Vaping Use   Vaping Use: Never used  Substance and Sexual Activity   Alcohol use: No    Alcohol/week: 0.0 standard  drinks of alcohol   Drug use: No   Sexual activity: Not Currently    Relevant family history:  Family History  Problem Relation Age of Onset   Breast cancer Mother 54   Heart disease Mother    Throat cancer Father    Hypertension Father    Arthritis Father    Diabetes Father    Arthritis Sister    Obesity Sister    Diabetes Sister    Heart disease Cousin    Colon cancer Neg Hx    Stomach cancer Neg Hx    Esophageal cancer Neg Hx     Past Medical History:  Diagnosis Date   Abdominal pain 01/26/2012   Acute kidney injury superimposed on chronic kidney disease 12/18/2014   Allergy    takes Mucinex daily as needed   Anemia, unspecified    Anxiety    takes Clonazepam daily as needed   Anxiety associated with depression 06/08/2017   Arthritis    Breast cancer    Breast cancer screening 02/19/2014   Breast mass 10/27/2014   Chronic diastolic CHF (congestive heart failure)    takes Furosemide daily   Chronic pain syndrome 02/06/2015   CKD (chronic kidney disease), stage III    Coronary artery disease    a. s/p IMI 2004 tx with BMS to RCA;  b. s/p Promus DES to RCA 2/12 c. abnormal nuc 2016 -> cath with med rx. d.  NSTEMI 02/2020  mv CAD with severe ISR in pRCA and m/dRCA lesion both treated with DES.   Debility 08/01/2012   Dementia    Depression    takes Cymbalta daily   Diabetes mellitus    insulin daily   DOE (dyspnea on exertion) 06/24/2014   Dysphagia, unspecified(787.20) 07/31/2012   Fungal dermatitis 11/13/2007   Qualifier: Diagnosis of  By: Amador CunasKwiatkowski  MD, Janett LabellaPeter F    GERD (gastroesophageal reflux disease)    takes Protonix daily   Gout, unspecified    Headache    occasionally   History of blood clots    History of deep venous thrombosis 01/27/2012   History of pulmonary embolus (PE) 04/22/2014   Hyperkalemia 07/26/2017   Hyperlipidemia    takes Pravastatin daily   Hypertension    Insomnia    Joint pain    Joint swelling    Morbid obesity     Multiple benign nevi 11/15/2016   Myocardial infarction 2004   Non-STEMI (non-ST elevated myocardial infarction) 02/15/2020   Numbness    Obstructive sleep apnea    does not wear cpap   Osteoarthritis    Osteoarthritis    Osteoarthrosis, unspecified whether generalized or localized, lower leg    Pain in the chest 12/31/2013   Pain, chronic    Personal history of other diseases of the digestive system 12/03/2006   Qualifier: Diagnosis of  By: Amador CunasKwiatkowski  MD, Janett LabellaPeter F    Polymyalgia rheumatica    Presence of stent in right coronary artery    Pulmonary emboli 01/2012   felt to need lifelong anticoagulation but Xarelto dc in 02/2020 due to need for DAPT   Situational depression 06/04/2009   Qualifier: Diagnosis of  By: Amador CunasKwiatkowski  MD, Janett LabellaPeter F    Syncope, vasovagal 07/04/2021   Type 2 diabetes mellitus with hyperlipidemia 02/16/2022   Urinary incontinence    takes Linzess daily   Vaginitis and vulvovaginitis 06/23/2016   Vertigo    hx of;was taking Meclizine if needed   Wears dentures     Past Surgical History:  Procedure Laterality Date   ABDOMINAL HYSTERECTOMY     partial   APPENDECTOMY     blood clots/legs and lungs  2013   BREAST BIOPSY Left 07/22/2014   BREAST BIOPSY Left 02/10/2013   BREAST LUMPECTOMY Left 11/05/2014   BREAST LUMPECTOMY WITH RADIOACTIVE SEED LOCALIZATION Left 11/05/2014   Procedure: LEFT BREAST LUMPECTOMY WITH RADIOACTIVE SEED LOCALIZATION;  Surgeon: Abigail Miyamotoouglas Blackman, MD;  Location: [facility] OR;  Service: General;  Laterality: Left;   CARDIAC CATHETERIZATION     COLONOSCOPY     CORONARY ANGIOPLASTY  2   CORONARY STENT INTERVENTION N/A 02/17/2020   Procedure: CORONARY STENT INTERVENTION;  Surgeon: Marykay LexHarding, David W, MD;  Location: [facility] INVASIVE CV LAB;  Service: Cardiovascular;  Laterality: N/A;   ESOPHAGOGASTRODUODENOSCOPY (EGD) WITH PROPOFOL N/A 11/07/2016   Procedure: ESOPHAGOGASTRODUODENOSCOPY (EGD) WITH PROPOFOL;  Surgeon: Iva BoopGessner, Carl E, MD;  Location: [facility]  ENDOSCOPY;  Service: Endoscopy;  Laterality: N/A;   EXCISION OF SKIN TAG Right 11/05/2014   Procedure: EXCISION OF RIGHT EYELID SKIN TAG;  Surgeon: Abigail Miyamotoouglas Blackman, MD;  Location: [facility] OR;  Service: General;  Laterality: Right;   EYE SURGERY Bilateral    cataract    GASTRIC BYPASS  1977    [facility]   LEFT HEART CATH AND CORONARY ANGIOGRAPHY N/A 02/17/2020   Procedure: LEFT HEART CATH AND CORONARY ANGIOGRAPHY;  Surgeon: Marykay LexHarding, David W, MD;  [facility];  Service: Cardiovascular;  Laterality: N/A;   LEFT HEART CATHETERIZATION WITH CORONARY ANGIOGRAM N/A 06/29/2014   Procedure: LEFT HEART CATHETERIZATION WITH CORONARY ANGIOGRAM;  Surgeon: Lennette Bihari, MD;  Location: [facility] CATH LAB;  Service: Cardiovascular;  Laterality: N/A;   MEMBRANE PEEL Right 10/23/2018   Procedure: MEMBRANE PEEL;  Surgeon: Edmon Crape, MD;  [facility] OR;  Service: Ophthalmology;  Laterality: Right;   MI with stent placement  2004   PARS PLANA VITRECTOMY Right 10/23/2018   Procedure: PARS PLANA VITRECTOMY WITH 25 GAUGE;  Surgeon: Edmon Crape, MD;  Location: [facility] OR;  Service: Ophthalmology;  Laterality: Right;     Physical Exam: Blood pressure 134/74, pulse 67, temperature 98 F (36.7 C), temperature source Oral, height 5\' 7"  (1.702 m), weight 283 lb 12.8 oz (128.7 kg), SpO2 100 %. Gen:      No acute distress, HOH ENT:  no nasal polyps, mucus membranes moist Lungs:    diminished No increased respiratory effort, symmetric chest wall excursion, clear to auscultation bilaterally, no wheezes or crackles CV:         diminished Regular rate and rhythm; no murmurs, rubs, or gallops.  No pedal edema Abd:      Obese, soft non-tender MSK: no acute synovitis of DIP or PIP joints, no mechanics hands.  Skin:      Warm and dry; no rashes Neuro: normal speech, no focal facial asymmetry Psych: alert and oriented x3, normal mood and affect   Data Reviewed/Medical Decision  Making:  Independent interpretation of tests: Imaging:  Review of patient's chest xray march 2024 images revealed low lung volumes, bibasilar depended atelectasis. [name] images have been independently reviewed by me.    PFTs: I have personally reviewed the patient's PFTs and show no airflow limitation but suggest restriction to ventilation.     Latest Ref Rng & Units 02/02/2016   11:52 AM  PFT Results  FVC-Pre L 1.68   FVC-Predicted Pre % 69   FVC-Post L 1.75   FVC-Predicted Post % 72   Pre FEV1/FVC % % 81   Post FEV1/FCV % % 81   FEV1-Pre L 1.36   FEV1-Predicted Pre % 73   FEV1-Post L 1.43   DLCO uncorrected ml/min/mmHg 17.83   DLCO UNC% % 62   DLCO corrected ml/min/mmHg 18.68   DLCO COR %Predicted % 66   DLVA Predicted % 80     Labs:  Lab Results  Component Value Date   NA 139 07/11/2022   K 5.1 07/11/2022   CO2 28 07/11/2022   GLUCOSE 175 (H) 07/11/2022   BUN 56 (H) 07/11/2022   CREATININE 1.97 (H) 07/11/2022   CALCIUM 9.0 07/11/2022   EGFR 32 (L) 08/24/2021   GFRNONAA 24 (L) 07/11/2022   Lab Results  Component Value Date   WBC 13.0 (H) 07/11/2022   HGB 8.7 (L) 07/11/2022   HCT 27.9 (L) 07/11/2022   MCV 93.0 07/11/2022   PLT 251 07/11/2022     Immunization status:  Immunization History  Administered Date(s) Administered   Fluad Quad(high Dose 65+) 02/12/2019, 02/18/2020, 01/26/2021   Influenza Whole 03/14/2007, 02/13/2008, 01/18/2010   Influenza, High Dose Seasonal PF 01/16/2017, 03/11/2018   Influenza,inj,Quad PF,6+ Mos 01/08/2013, 02/18/2014, 01/18/2015, 01/24/2016   Influenza-Unspecified 02/15/2012, 02/18/2014   PFIZER(Purple Top)SARS-COV-2 Vaccination 07/06/2019, 08/05/2019, 12/30/2019   Pneumococcal Conjugate-13 02/27/2017   Pneumococcal Polysaccharide-23 05/08/2002   Td 05/08/2008     I reviewed prior external note(s) from primary care  I  reviewed the result(s) of the labs and imaging as noted above.   I have ordered    Assessment:   Shortness of breath, PFTs with restriction to ventilation Bibasilar atelectasis Passive smoke exposure.   Plan/Recommendations: Dyspnea most likey secondary to diastolic dysfunction, obesity and deconditioning. Most likely restriction to ventilation secondary to body habitus.  Chest xray finding of basilar atelectasis correlates with this.  She does not have COPD - never smoker. Symptoms uncharacteristic for asthma. Ok to continue inhalers if clinical benefit. She wants to hold off on PFTs at this time.  Recommend ongoing fluid management with torsemide for Grade 2 DD. Happy to see her back if symptoms worsen or change.   We discussed disease management and progression at length today.   I spent 45 minutes in the care of this patient today including pre-charting, chart review, review of results, face-to-face care, coordination of care and communication with consultants etc.).  \ Return to Care: Return if symptoms worsen or fail to improve.  Durel Salts, MD Pulmonary and Critical Care Medicine [facility] Office:[phone]  CC: [name], MD

## 2022-08-15 NOTE — Patient Instructions (Signed)
Your doctor sent you to come see me due to a chest x-ray which showed something called atelectasis.  This finding is most likely there because you did not get a good deep breath at the time of the x-ray.  You have shortness of breath and I think is very unlikely you have COPD since he never smoked.  If inhalers are helpful to you it is okay to continue them.  I would not advise continue inhalers at I do not help you feel better or breathe better.  Most likely her shortness of breath is related to having a stiff heart and being deconditioned.  The neck step for [place] would be to perform some pulmonary function testing which is formal breathing testing to help me understand better where the difficulty in your breathing is.  It is completely understandable if you would like to hold off on this for now.  He is low back exam review breathing does not continue to improve after starting the water pill torsemide.  I am happy to reevaluate and reorder the testing if you change your mind.

## 2022-08-17 ENCOUNTER — Other Ambulatory Visit: Payer: PPO

## 2022-08-17 VITALS — BP 132/72 | HR 71 | Temp 98.1°F

## 2022-08-17 DIAGNOSIS — Z515 Encounter for palliative care: Secondary | ICD-10-CM

## 2022-08-17 NOTE — Progress Notes (Signed)
PATIENT NAME: [name] DOB: Jun 22, 1934 MRN: [id number]  PRIMARY CARE PROVIDER: [name], MD  RESPONSIBLE PARTY:  Acct ID - Guarantor Home Phone Work Phone Relationship Acct Type  [id number] ADAH, WEGNER* [phone]  Self P/F     [address] [place], [place] [address]   Palliative Care Follow Up Encounter Note    Completed home visit. Daughter Rinaldo Cloud also present.     HISTORY OF PRESENT ILLNESS:     Respiratory: SOB on exertion   Cardiac: HR regular   Cognitive: alert and oriented to place and self; has Dementia; left Dementia booklet on last visit; unable to locate booklet - left another booklet and will discuss at next visit   Appetite: pt reports her appetite has decreased and she isn't eating much; encouraged pt to drink her protein drinks; still drinks sugar free liquids (ginger ale)   GI/GU: has irregular BMs; reports she had a BM on 4/10; no urinary issues; encouraged daughter to give stool softeners   Mobility: reports ability to walk from the bed to the recliner   ADLs: dependent on caregivers   Sleeping Pattern: sleeps well but wakes up for bathroom use on her BSC; able to go back to sleep   Pain: 7/10 pain to L shoulder; takes oxycodone for effective pain control; reports her shoulder has been hurting more lately and when she went to the Pain Management doctor  Diabetes: still takes blood sugar w/Libre 2; 144 at this time; pt reports BS is controlled     Goals of Care: To stay in the home with caregivers   CODE STATUS: DNR  ADVANCED DIRECTIVES: N MOST FORM: Y PPS: 40%    PHYSICAL EXAM:   VITALS: Today's Vitals   08/17/22 1303  BP: 132/72  Pulse: 71  Temp: 98.1 F (36.7 C)  TempSrc: Temporal  SpO2: 96%  PainSc: 7   PainLoc: Shoulder    LUNGS: clear to auscultation ; R side has diminished breath sounds CARDIAC: Cor RRR EXTREMITIES: BLE edema - non pitting SKIN: cool, dry  NEURO: negative except for gait problems,  headaches, and weakness; dizziness    Pt reports she's cold all of the time.   Kazmir Oki Clementeen Graham, LPN

## 2022-08-21 ENCOUNTER — Ambulatory Visit (INDEPENDENT_AMBULATORY_CARE_PROVIDER_SITE_OTHER): Payer: PPO | Admitting: Internal Medicine

## 2022-08-21 ENCOUNTER — Encounter: Payer: Self-pay | Admitting: Internal Medicine

## 2022-08-21 VITALS — BP 118/72 | HR 70 | Ht 67.0 in | Wt 283.0 lb

## 2022-08-21 DIAGNOSIS — E1140 Type 2 diabetes mellitus with diabetic neuropathy, unspecified: Secondary | ICD-10-CM

## 2022-08-21 DIAGNOSIS — N184 Chronic kidney disease, stage 4 (severe): Secondary | ICD-10-CM

## 2022-08-21 DIAGNOSIS — Z794 Long term (current) use of insulin: Secondary | ICD-10-CM | POA: Diagnosis not present

## 2022-08-21 DIAGNOSIS — E1122 Type 2 diabetes mellitus with diabetic chronic kidney disease: Secondary | ICD-10-CM

## 2022-08-21 DIAGNOSIS — E1165 Type 2 diabetes mellitus with hyperglycemia: Secondary | ICD-10-CM

## 2022-08-21 LAB — POCT GLYCOSYLATED HEMOGLOBIN (HGB A1C): Hemoglobin A1C: 7.8 % — AB (ref 4.0–5.6)

## 2022-08-21 MED ORDER — OZEMPIC (0.25 OR 0.5 MG/DOSE) 2 MG/3ML ~~LOC~~ SOPN: 0.5000 mg | PEN_INJECTOR | SUBCUTANEOUS | 11 refills | Status: DC

## 2022-08-21 NOTE — Patient Instructions (Signed)
-   Decrease Toujeo  36 units daily  - Continue Novolog 14 units with Brunch  and 14 units with Supper  - Start Ozempic 0.25 mg ONCE weekly for 6 weeks, than increase to 0.5 mg weekly after that   - Novolog correctional insulin: ADD extra units on insulin to your meal-time Novolog dose if your blood sugars are higher than 180. Use the scale below to help guide you:   Blood sugar before meal Number of units to inject  Less than 180 0 unit  181 -  210 1 units  211 -  240 2 units  241 -  270 3 units  271 -  300 4 units  301 -  330 5 units  331 -  360 6 units  361 -  390 7 units     HOW TO TREAT LOW BLOOD SUGARS (Blood sugar LESS THAN 70 MG/DL) Please follow the RULE OF 15 for the treatment of hypoglycemia treatment (when your (blood sugars are less than 70 mg/dL)   STEP 1: Take 15 grams of carbohydrates when your blood sugar is low, which includes:  3-4 GLUCOSE TABS  OR 3-4 OZ OF JUICE OR REGULAR SODA OR ONE TUBE OF GLUCOSE GEL    STEP 2: RECHECK blood sugar in 15 MINUTES STEP 3: If your blood sugar is still low at the 15 minute recheck --> then, go back to

## 2022-08-21 NOTE — Progress Notes (Signed)
Name: [name]  Age/ Sex: 88 y.o., female   MRN/ DOB: [id number], 07-Aug-1934     PCP: [name], MD   Reason for Endocrinology Evaluation: Type 2 Diabetes Mellitus     Initial Endocrinology Clinic Visit: 08/19/2018    PATIENT IDENTIFIER: [name] is a 88 y.o. female with a past medical history of T2DM, CKD III, and chronic pain syndrome . The patient has followed with Endocrinology clinic since 08/19/2018 for consultative assistance with management of her diabetes.  DIABETIC HISTORY:  [name] was diagnosed with T2DM > 40 yrs ago. Pt is a poor historian and unable to recall oral glycemic agents. She has been on insulin therapy for years. Her hemoglobin A1c has ranged from 6.7%  in 07/2017, peaking at 12.1% in 2018.  SUBJECTIVE:   During the last visit (02/16/2022): A1c 7.1 %    Today (08/21/2022): [name] is here for a follow up visit on diabetes management. She is accompanied by her grandson They use the CGM at home . She has been noted with hypoglycemia on freestyle libre . These are noted overnight   Patient is under palliative care for exertional dyspnea She was seen by pulmonology for COPD follow-up 08/15/2022 She also had a follow-up with cardiology 07/17/2022 for CHF She had a follow-up with oncology 07/11/2022 for history of left sided invasive ductal carcinoma  Denies nausea but has chronic constipation    HOME DIABETES REGIMEN:  Toujeo 40  units daily  Novolog 14 units with Brunch and 14 units with Supper  Trulicity 1.5  mg weekly - not taking  CF : BG- 150/30    CONTINUOUS GLUCOSE MONITORING RECORD INTERPRETATION    Dates of Recording: 4/2-4/15/2024  Sensor description:freestyle libre  Results statistics:   CGM use % of time 53  Average and SD 121/37.8  Time in range  76 %  % Time Above 180 12  % Time above 250 0  % Time Below target 6     Glycemic patterns summary: Pt noted with hypoglycemia at night and hyperglycemia  during the day   Hyperglycemic episodes  all day   Hypoglycemic episodes occurred at night  Overnight periods: trends down         HISTORY:  Past Medical History:  Past Medical History:  Diagnosis Date   Abdominal pain 01/26/2012   Acute kidney injury superimposed on chronic kidney disease 12/18/2014   Allergy    takes Mucinex daily as needed   Anemia, unspecified    Anxiety    takes Clonazepam daily as needed   Anxiety associated with depression 06/08/2017   Arthritis    Breast cancer    Breast cancer screening 02/19/2014   Breast mass 10/27/2014   Chronic diastolic CHF (congestive heart failure)    takes Furosemide daily   Chronic pain syndrome 02/06/2015   CKD (chronic kidney disease), stage III    Coronary artery disease    a. s/p IMI 2004 tx with BMS to RCA;  b. s/p Promus DES to RCA 2/12 c. abnormal nuc 2016 -> cath with med rx. d. NSTEMI 02/2020  mv CAD with severe ISR in pRCA and m/dRCA lesion both treated with DES.   Debility 08/01/2012   Dementia    Depression    takes Cymbalta daily   Diabetes mellitus    insulin daily   DOE (dyspnea on exertion) 06/24/2014   Dysphagia, unspecified(787.20) 07/31/2012   Fungal dermatitis 11/13/2007  Qualifier: Diagnosis of  By: Amador Cunas  MD, Janett Labella    GERD (gastroesophageal reflux disease)    takes Protonix daily   Gout, unspecified    Headache    occasionally   History of blood clots    History of deep venous thrombosis 01/27/2012   History of pulmonary embolus (PE) 04/22/2014   Hyperkalemia 07/26/2017   Hyperlipidemia    takes Pravastatin daily   Hypertension    Insomnia    Joint pain    Joint swelling    Morbid obesity    Multiple benign nevi 11/15/2016   Myocardial infarction 2004   Non-STEMI (non-ST elevated myocardial infarction) 02/15/2020   Numbness    Obstructive sleep apnea    does not wear cpap   Osteoarthritis    Osteoarthritis    Osteoarthrosis, unspecified whether generalized or  localized, lower leg    Pain in the chest 12/31/2013   Pain, chronic    Personal history of other diseases of the digestive system 12/03/2006   Qualifier: Diagnosis of  By: Amador Cunas  MD, Janett Labella    Polymyalgia rheumatica    Presence of stent in right coronary artery    Pulmonary emboli 01/2012   felt to need lifelong anticoagulation but Xarelto dc in 02/2020 due to need for DAPT   Situational depression 06/04/2009   Qualifier: Diagnosis of  By: Amador Cunas  MD, Janett Labella    Syncope, vasovagal 07/04/2021   Type 2 diabetes mellitus with hyperlipidemia 02/16/2022   Urinary incontinence    takes Linzess daily   Vaginitis and vulvovaginitis 06/23/2016   Vertigo    hx of;was taking Meclizine if needed   Wears dentures    Past Surgical History:  Past Surgical History:  Procedure Laterality Date   ABDOMINAL HYSTERECTOMY     partial   APPENDECTOMY     blood clots/legs and lungs  2013   BREAST BIOPSY Left 07/22/2014   BREAST BIOPSY Left 02/10/2013   BREAST LUMPECTOMY Left 11/05/2014   BREAST LUMPECTOMY WITH RADIOACTIVE SEED LOCALIZATION Left 11/05/2014   Procedure: LEFT BREAST LUMPECTOMY WITH RADIOACTIVE SEED LOCALIZATION;  Surgeon: Abigail Miyamoto, MD;  Location: [facility] OR;  Service: General;  Laterality: Left;   CARDIAC CATHETERIZATION     COLONOSCOPY     CORONARY ANGIOPLASTY  2   CORONARY STENT INTERVENTION N/A 02/17/2020   Procedure: CORONARY STENT INTERVENTION;  Surgeon: Marykay Lex, MD;  Location: [facility] INVASIVE CV LAB;  Service: Cardiovascular;  Laterality: N/A;   ESOPHAGOGASTRODUODENOSCOPY (EGD) WITH PROPOFOL N/A 11/07/2016   Procedure: ESOPHAGOGASTRODUODENOSCOPY (EGD) WITH PROPOFOL;  Surgeon: Iva Boop, MD;  Location: [facility] ENDOSCOPY;  Service: Endoscopy;  Laterality: N/A;   EXCISION OF SKIN TAG Right 11/05/2014   Procedure: EXCISION OF RIGHT EYELID SKIN TAG;  Surgeon: Abigail Miyamoto, MD;  Location: [facility] OR;  Service: General;  Laterality: Right;   EYE SURGERY Bilateral     cataract    GASTRIC BYPASS  1977    reversed in 1979, [facility]   LEFT HEART CATH AND CORONARY ANGIOGRAPHY N/A 02/17/2020   Procedure: LEFT HEART CATH AND CORONARY ANGIOGRAPHY;  Surgeon: Marykay Lex, MD;  Location: [facility] INVASIVE CV LAB;  Service: Cardiovascular;  Laterality: N/A;   LEFT HEART CATHETERIZATION WITH CORONARY ANGIOGRAM N/A 06/29/2014   Procedure: LEFT HEART CATHETERIZATION WITH CORONARY ANGIOGRAM;  Surgeon: Lennette Bihari, MD;  Location: [facility] CATH LAB;  Service: Cardiovascular;  Laterality: N/A;   MEMBRANE PEEL Right 10/23/2018   Procedure: MEMBRANE PEEL;  Surgeon: Fawn Kirk  A, MD;  Location: [facility] OR;  Service: Ophthalmology;  Laterality: Right;   MI with stent placement  2004   PARS PLANA VITRECTOMY Right 10/23/2018   Procedure: PARS PLANA VITRECTOMY WITH 25 GAUGE;  Surgeon: Edmon Crape, MD;  Location: [facility] OR;  Service: Ophthalmology;  Laterality: Right;   Social History:  reports that she has never smoked. She has been exposed to tobacco smoke. She has never used smokeless tobacco. She reports that she does not drink alcohol and does not use drugs. Family History:  Family History  Problem Relation Age of Onset   Breast cancer Mother 60   Heart disease Mother    Throat cancer Father    Hypertension Father    Arthritis Father    Diabetes Father    Arthritis Sister    Obesity Sister    Diabetes Sister    Heart disease Cousin    Colon cancer Neg Hx    Stomach cancer Neg Hx    Esophageal cancer Neg Hx      HOME MEDICATIONS: Allergies as of 08/21/2022       Reactions   Sulfonamide Derivatives Swelling, Other (See Comments)   Mouth swelling- no resp issues noted   Lokelma [sodium Zirconium Cyclosilicate] Other (See Comments)   Headache, stomach upset   Aricept [donepezil Hcl] Diarrhea, Nausea And Vomiting   GI upset/loose stools   Tramadol Nausea And Vomiting        Medication List        Accurate as of August 21, 2022  1:02 PM. If you have any  questions, ask your nurse or doctor.          albuterol 108 (90 Base) MCG/ACT inhaler Commonly known as: ProAir HFA Inhale 1-2 puffs into the lungs every 6 (six) hours as needed for wheezing or shortness of breath.   anastrozole 1 MG tablet Commonly known as: ARIMIDEX TAKE 1 TABLET BY MOUTH EVERY DAY   apixaban 5 MG Tabs tablet Commonly known as: Eliquis Take 1 tablet (5 mg total) by mouth 2 (two) times daily.   atorvastatin 80 MG tablet Commonly known as: LIPITOR TAKE 1 TABLET BY MOUTH EVERY DAY   B & B Carpal Tunnel Brace Misc Use brace on wrist as needed for wrist pain   carvedilol 12.5 MG tablet Commonly known as: COREG TAKE 1 TABLET BY MOUTH 2 TIMES DAILY   cetirizine 10 MG tablet Commonly known as: ZYRTEC Take 10 mg by mouth daily as needed for allergies or rhinitis.   diclofenac Sodium 1 % Gel Commonly known as: Voltaren Apply 2 g topically 4 (four) times daily. What changed:  when to take this reasons to take this   DULoxetine 30 MG capsule Commonly known as: CYMBALTA TAKE 1 CAPSULE BY MOUTH EVERY DAY   febuxostat 40 MG tablet Commonly known as: ULORIC Take 1 tablet (40 mg total) by mouth daily.   fluticasone 50 MCG/ACT nasal spray Commonly known as: FLONASE Place 1 spray into both nostrils in the morning and at bedtime. What changed:  when to take this reasons to take this   folic acid 1 MG tablet Commonly known as: FOLVITE TAKE 1 TABLET BY MOUTH EVERY DAY   FreeStyle Libre 14 Day Sensor Misc PLACE 1 DEVICE ON THE SKIN AS DIRECTED EVERY 14 DAYS   glucagon 1 MG injection Inject 1 mg into the vein once as needed for up to 1 dose.   glucose 4 GM chewable tablet Chew 1 tablet (4 g  total) by mouth as needed for low blood sugar.   guaifenesin 100 MG/5ML syrup Commonly known as: ROBITUSSIN Take 200 mg by mouth 3 (three) times daily as needed for cough.   linaclotide 145 MCG Caps capsule Commonly known as: LINZESS Take 1 capsule (145 mcg  total) by mouth daily before breakfast.   meclizine 25 MG tablet Commonly known as: ANTIVERT Take 1-2 tablets (25-50 mg total) by mouth 2 (two) times daily as needed for dizziness.   memantine 5 MG tablet Commonly known as: NAMENDA TAKE 2 TABLETS BY MOUTH EVERY DAY and TAKE 1 TABLET BY MOUTH EVERY EVENING What changed:  how much to take how to take this when to take this   montelukast 10 MG tablet Commonly known as: SINGULAIR Take 1 tablet (10 mg total) by mouth at bedtime.   nitroGLYCERIN 0.4 MG SL tablet Commonly known as: Nitrostat Take one tablet under the tongue every 5 minutes as needed for chest pain   NovoLOG FlexPen 100 UNIT/ML FlexPen Generic drug: insulin aspart Inject 14 Units into the skin 2 (two) times daily with a meal. Max daily dose 70 units   nystatin cream Commonly known as: MYCOSTATIN Apply 1 Application topically 2 (two) times daily.   ondansetron 4 MG tablet Commonly known as: Zofran Take 1 tablet (4 mg total) by mouth every 8 (eight) hours as needed for nausea or vomiting.   oxyCODONE-acetaminophen 7.5-325 MG tablet Commonly known as: PERCOCET Take 1 tablet by mouth every 12 (twelve) hours.   oxyCODONE-acetaminophen 7.5-325 MG tablet Commonly known as: PERCOCET Take 1 tablet by mouth every 12 (twelve) hours. Start taking on: September 01, 2022   Ozempic (0.25 or 0.5 MG/DOSE) 2 MG/3ML Sopn Generic drug: Semaglutide(0.25 or 0.5MG /DOS) Inject 0.5 mg into the skin once a week. Started by: Scarlette Shorts, MD   pantoprazole 20 MG tablet Commonly known as: PROTONIX TAKE 1 TABLET BY MOUTH EVERY DAY   sodium polystyrene 15 GM/60ML suspension Commonly known as: SPS TAKE 60 MLS BY MOUTH ONCE WEEKLY TO KEEP POTASSIUM DOWN   Symbicort 160-4.5 MCG/ACT inhaler Generic drug: budesonide-formoterol Inhale 2 puffs by mouth twice daily   torsemide 20 MG tablet Commonly known as: DEMADEX TAKE 1 TABLET BY MOUTH EVERY DAY. TAKE AN EXTRA DOSE FOR WEIGHT  GAIN, 3LB IN 1 DAY OR 5LB IN 1 WEEK   Toujeo Max SoloStar 300 UNIT/ML Solostar Pen Generic drug: insulin glargine (2 Unit Dial) Inject 40 Units into the skin daily. Patient assistance program provides. Patient reports 40 units 04/24/2022   Vitamin D (Ergocalciferol) 1.25 MG (50000 UNIT) Caps capsule Commonly known as: DRISDOL TAKE 1 CAPSULE BY MOUTH every 7 days        PHYSICAL EXAM: VS: BP 118/72 (BP Location: Left Arm, Patient Position: Sitting, Cuff Size: Large)   Pulse 70   Ht 5\' 7"  (1.702 m)   Wt 283 lb (128.4 kg)   SpO2 95%   BMI 44.32 kg/m    EXAM: General: Pt appears well and is in NAD in a wheelchair   Lungs: Clear with good BS bilat with no rales, rhonchi, or wheezes  Heart: Auscultation: RRR   Extremities:  BL LE: No pretibial edema    DM Foot Exam 08/21/2022   The skin of the feet is intact without sores or ulcerations. The pedal pulses are 1+ on right and 1+ on left. The sensation is undetectable  to a screening 5.07, 10 gram monofilament on the right and decreased on the left  DATA REVIEWED:  Lab Results  Component Value Date   HGBA1C 7.8 (A) 08/21/2022   HGBA1C 7.1 (H) 02/12/2022   HGBA1C 7.0 (H) 07/04/2021      Latest Reference Range & Units 07/11/22 12:53  Sodium 135 - 145 mmol/L 139  Potassium 3.5 - 5.1 mmol/L 5.1  Chloride 98 - 111 mmol/L 103  CO2 22 - 32 mmol/L 28  Glucose 70 - 99 mg/dL 409 (H)  BUN 8 - 23 mg/dL 56 (H)  Creatinine 8.11 - 1.00 mg/dL 9.14 (H)  Calcium 8.9 - 10.3 mg/dL 9.0  Anion gap 5 - 15  8  Alkaline Phosphatase 38 - 126 U/L 96  Albumin 3.5 - 5.0 g/dL 3.4 (L)  AST 15 - 41 U/L 10 (L)  ALT 0 - 44 U/L 8  Total Protein 6.5 - 8.1 g/dL 7.7  Total Bilirubin 0.3 - 1.2 mg/dL 0.4  GFR, Est Non African American >60 mL/min 24 (L)    ASSESSMENT / PLAN / RECOMMENDATIONS:   1)Type 2 Diabetes Mellitus, Sub-Optimally  Controlled, With Neuropathic, CKD IV and macrovascular complications - Most recent A1c of 7.8 %. Goal A1c < 7.5  %.      - A1c is skewed due to CKD - Main barriers to diabetes care is memory loss.  -She is asking for Glp-1 agonist to help with weight loss, I had prescribed Trulicity in the past which was cost prohibitive and did not meet criteria for pt assistance  - Will reduce toujeo due to hypoglycemia  -Will prescribe Ozempic    MEDICATIONS: Decrease  Toujeo 36  units daily  Continue Novolog 14 units Twith Brunch and 14 units with Supper  Continue CF : BG- 150/30 TIDQAC Start Ozempic 0.25 mg weekly for 6 weeks, than increase to 0.5 mg weekly   EDUCATION / INSTRUCTIONS: BG monitoring instructions: Patient is instructed to check her blood sugars 4 times a day, before meals and bedtime. Call [facility] if: BG persistently < 70  I reviewed the Rule of 15 for the treatment of hypoglycemia in detail with the patient. Literature supplied.    F/U in 6 months    Signed electronically by: Lyndle Herrlich, MD  [facility]  [facility] [address] [place], [place] [address] Phone: [phone] FAX: [phone]   CC: [name], MD [address] [place] [address] Phone: [phone]  Fax: [phone]  Return to Endocrinology clinic as below: Future Appointments  Date Time Provider Department Center  08/29/2022  1:45 PM [name], DPM [facility]  09/07/2022  2:00 PM Edward Jolly, MD [facility] None  09/19/2022  1:00 PM Consuello Masse, RN [facility] None  10/12/2022  9:00 AM [facility]  01/10/2023  1:30 PM [facility] None  01/10/2023  2:00 PM [name], MD [facility] None  02/02/2023 11:00 AM Meriam Sprague, MD [facility]

## 2022-08-25 ENCOUNTER — Other Ambulatory Visit (HOSPITAL_COMMUNITY): Payer: Self-pay

## 2022-08-25 ENCOUNTER — Telehealth: Payer: Self-pay

## 2022-08-25 NOTE — Telephone Encounter (Signed)
Patient Advocate Encounter   Received notification from [facility] that prior authorization is required for Ozempic  Submitted: 08/25/22 Key [id number]  Status is pending

## 2022-08-28 ENCOUNTER — Other Ambulatory Visit: Payer: Self-pay | Admitting: Hematology

## 2022-08-29 ENCOUNTER — Ambulatory Visit: Payer: PPO | Admitting: Podiatry

## 2022-08-30 ENCOUNTER — Ambulatory Visit (INDEPENDENT_AMBULATORY_CARE_PROVIDER_SITE_OTHER): Payer: PPO | Admitting: Podiatry

## 2022-08-30 ENCOUNTER — Encounter: Payer: Self-pay | Admitting: Podiatry

## 2022-08-30 DIAGNOSIS — M21969 Unspecified acquired deformity of unspecified lower leg: Secondary | ICD-10-CM

## 2022-08-30 DIAGNOSIS — E1169 Type 2 diabetes mellitus with other specified complication: Secondary | ICD-10-CM | POA: Diagnosis not present

## 2022-08-30 DIAGNOSIS — B351 Tinea unguium: Secondary | ICD-10-CM | POA: Diagnosis not present

## 2022-08-30 DIAGNOSIS — M79609 Pain in unspecified limb: Secondary | ICD-10-CM | POA: Diagnosis not present

## 2022-08-30 NOTE — Progress Notes (Signed)
This patient returns to my office for at risk foot care.  This patient requires this care by a professional since this patient will be at risk due to having diabetes and CKD. She presents to the office with her daughter in a wheelchair with her grandson. This patient is unable to cut nails herself since the patient cannot reach her nails.These nails are painful walking and wearing shoes.  This patient presents for at risk foot care today.  General Appearance  Alert, conversant and in no acute stress.  Vascular  Dorsalis pedis and posterior tibial  pulses are  weakly palpable  bilaterally.  Capillary return is within normal limits  bilaterally. Temperature is within normal limits  bilaterally.  Neurologic  Senn-Weinstein monofilament wire test within normal limits  bilaterally. Muscle power within normal limits bilaterally.  Nails Thick disfigured discolored nails with subungual debris  from hallux to fifth toes bilaterally. No evidence of bacterial infection or drainage bilaterally.  Orthopedic  No limitations of motion  feet .  No crepitus or effusions noted.  No bony pathology or digital deformities noted. Deviation of toes 2-5 right foot at the MPJ.  Skin  normotropic skin with no porokeratosis noted bilaterally.  No signs of infections or ulcers noted.     Onychomycosis  Pain in right toes  Pain in left toes  Consent was obtained for treatment procedures.   Mechanical debridement of nails 1-5  bilaterally performed with a nail nipper.  Filed with dremel without incident.    Return office visit  12  weeks                  Told patient to return for periodic foot care and evaluation due to potential at risk complications.    [name] DPM

## 2022-09-01 ENCOUNTER — Telehealth: Payer: Self-pay

## 2022-09-01 NOTE — Telephone Encounter (Signed)
Patient following up on PA

## 2022-09-01 NOTE — Telephone Encounter (Signed)
No determination received. PA was re-faxed today

## 2022-09-01 NOTE — Telephone Encounter (Signed)
Patient will stop by and pick up a sample. Waiting on PA.

## 2022-09-04 NOTE — Telephone Encounter (Signed)
Patient came in to office today and picked up sample of Ozempic.

## 2022-09-05 ENCOUNTER — Other Ambulatory Visit (HOSPITAL_COMMUNITY): Payer: Self-pay

## 2022-09-05 ENCOUNTER — Encounter (HOSPITAL_COMMUNITY): Payer: Self-pay

## 2022-09-05 ENCOUNTER — Telehealth (INDEPENDENT_AMBULATORY_CARE_PROVIDER_SITE_OTHER): Payer: PPO | Admitting: Family Medicine

## 2022-09-05 ENCOUNTER — Encounter: Payer: Self-pay | Admitting: Hematology

## 2022-09-05 DIAGNOSIS — Z794 Long term (current) use of insulin: Secondary | ICD-10-CM

## 2022-09-05 DIAGNOSIS — J432 Centrilobular emphysema: Secondary | ICD-10-CM

## 2022-09-05 DIAGNOSIS — E1122 Type 2 diabetes mellitus with diabetic chronic kidney disease: Secondary | ICD-10-CM

## 2022-09-05 DIAGNOSIS — J441 Chronic obstructive pulmonary disease with (acute) exacerbation: Secondary | ICD-10-CM

## 2022-09-05 DIAGNOSIS — N184 Chronic kidney disease, stage 4 (severe): Secondary | ICD-10-CM | POA: Diagnosis not present

## 2022-09-05 MED ORDER — BENZONATATE 100 MG PO CAPS: 100.0000 mg | ORAL_CAPSULE | Freq: Two times a day (BID) | ORAL | 0 refills | Status: DC | PRN

## 2022-09-05 MED ORDER — PREDNISONE 20 MG PO TABS: 20.0000 mg | ORAL_TABLET | Freq: Every day | ORAL | 0 refills | Status: AC

## 2022-09-05 MED ORDER — DOXYCYCLINE HYCLATE 100 MG PO TABS: 100.0000 mg | ORAL_TABLET | Freq: Two times a day (BID) | ORAL | 0 refills | Status: AC

## 2022-09-05 NOTE — Telephone Encounter (Signed)
That was for her medical benefit. For some reason when it went through [facility], it faxed to their Medical department instead of pharmacy. Calling [phone]   Appeal initiated. Faxed chart notes and A1C labs again to [phone] Appeal Request ID: [id number] - should have a determination by 09/12/22

## 2022-09-05 NOTE — Progress Notes (Signed)
Virtual Visit via Video Note  I connected with [name] on 09/05/2022 at  4:00 PM EDT by a video enabled telemedicine application and verified that I am speaking with the correct person using two identifiers.  Location: Patient: home Provider: office   I discussed the limitations of evaluation and management by telemedicine and the availability of in person appointments. The patient expressed understanding and agreed to proceed.  History of Present Illness:  CHIEF COMPLAINT: Patient is experiencing bilateral ear pain, throat discomfort, stuffiness, mild wheezing, cough with expectoration, and shortness of breath.  HISTORY OF PRESENT ILLNESS:  Respiratory. Patient reports experiencing pain on both sides of her ears and a sensation of pain going down the back of her throat. She has a feeling of stuffiness and has been wheezing with a productive cough. She states she has increased shortness of breath, similar to her prior COPD exacerbations.  The patient has been using fluticasone nasal spray as needed but not her Symbicort or albuterol inhalers regularly.  Endocrine. The patient has pre-existing Type 2 diabetes mellitus, which is managed on insulin.  She reports recent improvement in glycemic control.  She has previously had difficulty controlling blood glucose when taking steroids.   Review of Systems  Constitutional:  Negative for fever.  HENT:  Positive for congestion and ear pain.   Respiratory:  Positive for cough, sputum production, shortness of breath and wheezing. Negative for hemoptysis.   Cardiovascular:  Negative for chest pain.  All other systems reviewed and are negative.   Observations/Objective: There were no vitals filed for this visit.   Gen: NAD, resting comfortably HEENT: EOMI Pulm: NWOB Skin: no rash on face Neuro: no facial asymmetry or dysmetria Psych: Normal affect   Assessment and Plan: Problem List Items Addressed This Visit       Respiratory    COPD with acute exacerbation (HCC) - Primary    The patient is likely experiencing an acute exacerbation of COPD given her reported symptoms of wheezing, shortness of breath, and productive cough.  Plan:  Advise using Symbicort and albuterol inhaler, Symbicort as maintenance, and albuterol as needed. Prescribe doxycycline 100 MG to manage possible respiratory or sinus infection. Prescribe prednisone at a lower dose of 20 MG, with close monitoring of blood sugar levels. Advise continued use of current diabetes management regimen and to be vigilant of blood sugar changes.      Relevant Medications   predniSONE (DELTASONE) 20 MG tablet   doxycycline (VIBRA-TABS) 100 MG tablet   benzonatate (TESSALON) 100 MG capsule     Endocrine   Type 2 diabetes mellitus with stage 4 chronic kidney disease, with long-term current use of insulin (HCC)    There are no discontinued medications.   Follow Up Instructions: No follow-ups on file.    I discussed the assessment and treatment plan with the patient. The patient was provided an opportunity to ask questions and all were answered. The patient agreed with the plan and demonstrated an understanding of the instructions.   The patient was advised to call back or seek an in-person evaluation if the symptoms worsen or if the condition fails to improve as anticipated.  [name], MD

## 2022-09-05 NOTE — Assessment & Plan Note (Signed)
The patient is likely experiencing an acute exacerbation of COPD given her reported symptoms of wheezing, shortness of breath, and productive cough.  Plan:  Advise using Symbicort and albuterol inhaler, Symbicort as maintenance, and albuterol as needed. Prescribe doxycycline 100 MG to manage possible respiratory or sinus infection. Prescribe prednisone at a lower dose of 20 MG, with close monitoring of blood sugar levels. Advise continued use of current diabetes management regimen and to be vigilant of blood sugar changes.

## 2022-09-05 NOTE — Telephone Encounter (Signed)
[employer] advantage would like a callback regarding PA at [phone]

## 2022-09-06 ENCOUNTER — Other Ambulatory Visit: Payer: Self-pay | Admitting: Family Medicine

## 2022-09-06 DIAGNOSIS — F0394 Unspecified dementia, unspecified severity, with anxiety: Secondary | ICD-10-CM | POA: Diagnosis not present

## 2022-09-06 DIAGNOSIS — N184 Chronic kidney disease, stage 4 (severe): Secondary | ICD-10-CM | POA: Diagnosis not present

## 2022-09-06 DIAGNOSIS — E261 Secondary hyperaldosteronism: Secondary | ICD-10-CM | POA: Diagnosis not present

## 2022-09-06 DIAGNOSIS — E1122 Type 2 diabetes mellitus with diabetic chronic kidney disease: Secondary | ICD-10-CM | POA: Diagnosis not present

## 2022-09-06 DIAGNOSIS — E1142 Type 2 diabetes mellitus with diabetic polyneuropathy: Secondary | ICD-10-CM | POA: Diagnosis not present

## 2022-09-06 DIAGNOSIS — D6869 Other thrombophilia: Secondary | ICD-10-CM | POA: Diagnosis not present

## 2022-09-06 DIAGNOSIS — F330 Major depressive disorder, recurrent, mild: Secondary | ICD-10-CM | POA: Diagnosis not present

## 2022-09-06 DIAGNOSIS — I110 Hypertensive heart disease with heart failure: Secondary | ICD-10-CM | POA: Diagnosis not present

## 2022-09-06 DIAGNOSIS — E1169 Type 2 diabetes mellitus with other specified complication: Secondary | ICD-10-CM | POA: Diagnosis not present

## 2022-09-06 DIAGNOSIS — Z794 Long term (current) use of insulin: Secondary | ICD-10-CM | POA: Diagnosis not present

## 2022-09-06 DIAGNOSIS — E1165 Type 2 diabetes mellitus with hyperglycemia: Secondary | ICD-10-CM | POA: Diagnosis not present

## 2022-09-07 ENCOUNTER — Ambulatory Visit
Payer: PPO | Attending: Student in an Organized Health Care Education/Training Program | Admitting: Student in an Organized Health Care Education/Training Program

## 2022-09-07 ENCOUNTER — Encounter: Payer: Self-pay | Admitting: Student in an Organized Health Care Education/Training Program

## 2022-09-07 VITALS — BP 124/68 | HR 72 | Temp 97.2°F | Resp 16 | Ht 67.0 in | Wt 277.6 lb

## 2022-09-07 DIAGNOSIS — M79641 Pain in right hand: Secondary | ICD-10-CM | POA: Insufficient documentation

## 2022-09-07 DIAGNOSIS — M47816 Spondylosis without myelopathy or radiculopathy, lumbar region: Secondary | ICD-10-CM | POA: Diagnosis not present

## 2022-09-07 DIAGNOSIS — G894 Chronic pain syndrome: Secondary | ICD-10-CM

## 2022-09-07 DIAGNOSIS — G8929 Other chronic pain: Secondary | ICD-10-CM

## 2022-09-07 DIAGNOSIS — M48062 Spinal stenosis, lumbar region with neurogenic claudication: Secondary | ICD-10-CM | POA: Diagnosis not present

## 2022-09-07 MED ORDER — OXYCODONE-ACETAMINOPHEN 7.5-325 MG PO TABS: 1.0000 | ORAL_TABLET | Freq: Two times a day (BID) | ORAL | 0 refills | Status: AC

## 2022-09-07 NOTE — Progress Notes (Signed)
PROVIDER NOTE: Information contained herein reflects review and annotations entered in association with encounter. Interpretation of such information and data should be left to medically-trained personnel. Information provided to patient can be located elsewhere in the medical record under "Patient Instructions". Document created using STT-dictation technology, any transcriptional errors that may result from process are unintentional.    Patient: [name]  Service Category: E/M  Provider: Edward Jolly, MD  DOB: 08-28-1934  DOS: 09/07/2022  Specialty: Interventional Pain Management  MRN: [id number]  Setting: Ambulatory outpatient  PCP: [name], MD  Type: Established Patient    Referring Provider: [name], MD  Location: Office  Delivery: Face-to-face     HPI  [name], a 88 y.o. year old female, is here today because of her Chronic pain of right hand [M79.641, G89.29]. [name] primary complain today is Joint Pain (Generalized ), Shoulder Pain (Left ), Foot Pain (Bilateral ), Hand Pain (Bilateral ), and Headache (Frontal affecting the ears as well )  Last encounter: My last encounter with her was on 06/15/22  Pertinent problems: [name] has Chronic radicular lumbar pain; Lumbar spondylosis; Spinal stenosis, lumbar region, with neurogenic claudication; and Bilateral primary osteoarthritis of knee on their pertinent problem list. Pain Assessment: Severity of Chronic pain is reported as a 9 /10. Location: Other (Comment) (generalized joint pain, and see visit info for additional pain sites.) Left, Right/left shoulder pain into back. Onset: More than a month ago. Quality: Discomfort, Aching, Numbness, Tingling, Constant. Timing: Constant. Modifying factor(s): medications.. Vitals:  height is 5\' 7"  (1.702 m) and weight is 277 lb 9.6 oz (125.9 kg). Her temporal temperature is 97.2 F (36.2 C) (abnormal). Her blood pressure is 124/68 and her pulse is 72. Her respiration is  16 and oxygen saturation is 100%.   Reason for encounter: medication management.   No change in medical history since last visit.  Patient's pain is at baseline.  Patient continues multimodal pain regimen as prescribed.  States that it provides pain relief and improvement in functional status. States that she has lost weight with Ozempic.    Pharmacotherapy Assessment  Analgesic:Percocet 7.5 mg twice daily as needed, quantity 60/month  Monitoring: [place] PMP: PDMP reviewed during this encounter.       Pharmacotherapy: No side-effects or adverse reactions reported. Compliance: No problems identified. Effectiveness: Clinically acceptable.  UDS:  Summary  Date Value Ref Range Status  03/21/2022 Note  Final    Comment:    ==================================================================== [employer] [facility] 13 (MW) ==================================================================== Test                             Result       Flag       Units  Drug Present and Declared for Prescription Verification   Oxycodone                      59           EXPECTED   ng/mg creat   Oxymorphone                    316          EXPECTED   ng/mg creat   Noroxycodone                   99           EXPECTED   ng/mg creat  Sources of oxycodone include scheduled prescription medications.    Oxymorphone and noroxycodone are expected metabolites of oxycodone.    Oxymorphone is also available as a scheduled prescription medication.  Drug Absent but Declared for Prescription Verification   Alprazolam                     Not Detected UNEXPECTED ng/mg creat ==================================================================== Test                      Result    Flag   Units      Ref Range   Creatinine              103              mg/dL      >=16 ==================================================================== Declared Medications:  The flagging and interpretation on this report are based on the   following declared medications.  Unexpected results may arise from  inaccuracies in the declared medications.   **Note: The testing scope of this panel includes these medications:   Alprazolam (Xanax)  Oxycodone (Percocet)   **Note: The testing scope of this panel does not include the  following reported medications:   Acetaminophen (Percocet)  Albuterol (Ventolin HFA)  Anastrozole (Arimidex)  Apixaban (Eliquis)  Atorvastatin (Lipitor)  Budesonide (Symbicort)  Carvedilol (Coreg)  Cetirizine (Zyrtec)  Clopidogrel (Plavix)  Diclofenac (Voltaren)  Docusate (Stool Softener)  Escitalopram (Lexapro)  Febuxostat (Uloric)  Fluticasone (Flonase)  Folic Acid  Formoterol (Symbicort)  Guaifenesin (Robitussin)  Insulin (NovoLog)  Linaclotide (Linzess)  Meclizine (Antivert)  Memantine (Namenda)  Nitroglycerin (Nitrostat)  Nystatin (Mycostatin)  Ondansetron (Zofran)  Pantoprazole (Protonix)  Sodium Polystyrene Sulfonate (Kayexalate)  Torsemide (Demadex)  Trazodone (Desyrel)  Vitamin D2 (Drisdol) ==================================================================== For clinical consultation, please call [phone]. ====================================================================       ROS  Constitutional: Denies any fever or chills Gastrointestinal: No reported hemesis, hematochezia, vomiting, or acute GI distress Musculoskeletal:  left shoulder pain, left hip pain low back pain Neurological: No reported episodes of acute onset apraxia, aphasia, dysarthria, agnosia, amnesia, paralysis, loss of coordination, or loss of consciousness  Medication Review  B & B Carpal Tunnel Brace, DULoxetine, FreeStyle Libre 14 Day Sensor, Semaglutide(0.25 or 0.5MG /DOS), Vitamin D (Ergocalciferol), albuterol, apixaban, atorvastatin, benzonatate, budesonide-formoterol, carvedilol, cetirizine, diclofenac Sodium, doxycycline, febuxostat, fluticasone, folic acid, glucagon, glucose, guaifenesin,  insulin aspart, insulin glargine (2 Unit Dial), linaclotide, meclizine, memantine, montelukast, nitroGLYCERIN, nystatin cream, ondansetron, oxyCODONE-acetaminophen, pantoprazole, predniSONE, sodium polystyrene, and torsemide  History Review  Allergy: [name] is allergic to sulfonamide derivatives, lokelma [sodium zirconium cyclosilicate], aricept [donepezil hcl], and tramadol. Drug: [name]  reports no history of drug use. Alcohol:  reports no history of alcohol use. Tobacco:  reports that she has never smoked. She has been exposed to tobacco smoke. She has never used smokeless tobacco. Social: [name]  reports that she has never smoked. She has been exposed to tobacco smoke. She has never used smokeless tobacco. She reports that she does not drink alcohol and does not use drugs. Medical:  has a past medical history of Abdominal pain (01/26/2012), Acute kidney injury superimposed on chronic kidney disease (HCC) (12/18/2014), Allergy, Anemia, unspecified, Anxiety, Anxiety associated with depression (06/08/2017), Arthritis, Breast cancer (HCC), Breast cancer screening (02/19/2014), Breast mass (10/27/2014), Chronic diastolic CHF (congestive heart failure) (HCC), Chronic pain syndrome (02/06/2015), CKD (chronic kidney disease), stage III (HCC), Coronary artery disease, Debility (08/01/2012), Dementia (HCC), Depression, Diabetes mellitus, DOE (dyspnea on exertion) (06/24/2014), Dysphagia, unspecified(787.20) (  07/31/2012), Fungal dermatitis (11/13/2007), GERD (gastroesophageal reflux disease), Gout, unspecified, Headache, History of blood clots, History of deep venous thrombosis (01/27/2012), History of pulmonary embolus (PE) (04/22/2014), Hyperkalemia (07/26/2017), Hyperlipidemia, Hypertension, Insomnia, Joint pain, Joint swelling, Morbid obesity (HCC), Multiple benign nevi (11/15/2016), Myocardial infarction (HCC) (2004), Non-STEMI (non-ST elevated myocardial infarction) (HCC) (02/15/2020), Numbness,  Obstructive sleep apnea, Osteoarthritis, Osteoarthritis, Osteoarthrosis, unspecified whether generalized or localized, lower leg, Pain in the chest (12/31/2013), Pain, chronic, Personal history of other diseases of the digestive system (12/03/2006), Polymyalgia rheumatica (HCC), Presence of stent in right coronary artery, Pulmonary emboli (HCC) (01/2012), Situational depression (06/04/2009), Syncope, vasovagal (07/04/2021), Type 2 diabetes mellitus with hyperlipidemia (HCC) (02/16/2022), Urinary incontinence, Vaginitis and vulvovaginitis (06/23/2016), Vertigo, and Wears dentures. Surgical: [name]  has a past surgical history that includes Appendectomy; Cardiac catheterization; Gastric bypass (1977 ); blood clots/legs and lungs (2013); MI with stent placement (2004); left heart catheterization with coronary angiogram (N/A, 06/29/2014); Coronary angioplasty (2); Eye surgery (Bilateral); Colonoscopy; Abdominal hysterectomy; Breast lumpectomy with radioactive seed localization (Left, 11/05/2014); Excision of skin tag (Right, 11/05/2014); Breast lumpectomy (Left, 11/05/2014); Breast biopsy (Left, 07/22/2014); Breast biopsy (Left, 02/10/2013); Esophagogastroduodenoscopy (egd) with propofol (N/A, 11/07/2016); Pars plana vitrectomy (Right, 10/23/2018); Membrane peel (Right, 10/23/2018); LEFT HEART CATH AND CORONARY ANGIOGRAPHY (N/A, 02/17/2020); and CORONARY STENT INTERVENTION (N/A, 02/17/2020). Family: family history includes Arthritis in her father and sister; Breast cancer (age of onset: 76) in her mother; Diabetes in her father and sister; Heart disease in her cousin and mother; Hypertension in her father; Obesity in her sister; Throat cancer in her father.  Laboratory Chemistry Profile   Renal Lab Results  Component Value Date   BUN 56 (H) 07/11/2022   CREATININE 1.97 (H) 07/11/2022   LABCREA 36 06/14/2017   BCR 30 (H) 05/05/2022   GFR 20.07 (L) 03/14/2022   GFRAA 38 (L) 03/02/2020   GFRNONAA 24 (L)  07/11/2022     Hepatic Lab Results  Component Value Date   AST 10 (L) 07/11/2022   ALT 8 07/11/2022   ALBUMIN 3.4 (L) 07/11/2022   ALKPHOS 96 07/11/2022   LIPASE 35 02/08/2022     Electrolytes Lab Results  Component Value Date   NA 139 07/11/2022   K 5.1 07/11/2022   CL 103 07/11/2022   CALCIUM 9.0 07/11/2022   MG 1.2 (L) 03/27/2022   PHOS 3.8 03/27/2022     Bone Lab Results  Component Value Date   VD25OH 64.31 09/09/2021     Inflammation (CRP: Acute Phase) (ESR: Chronic Phase) Lab Results  Component Value Date   CRP 1.0 (H) 01/30/2019   ESRSEDRATE 110 (H) 09/25/2014   LATICACIDVEN 1.2 02/08/2022       Note: Above Lab results reviewed.  Recent Imaging Review  DG Chest 2 View CLINICAL DATA:  88 year old female with shortness of breath  EXAM: CHEST - 2 VIEW  COMPARISON:  03/26/2022  FINDINGS: Cardiomediastinal silhouette unchanged in size and contour. No evidence of central vascular congestion. No interlobular septal thickening.  Low lung volumes persist with coarsened interstitial markings throughout. Linear atelectasis/scarring at the lung bases.  No pneumothorax or pleural effusion.  No acute displaced fracture. Degenerative changes of the spine.  IMPRESSION: Low lung volumes without evidence of acute cardiopulmonary disease  Electronically Signed   By: Gilmer Mor D.O.   On: 07/12/2022 16:10  Note: Reviewed        Physical Exam  General appearance: Well nourished, well developed, and well hydrated. In no apparent acute distress Mental status: Alert,  oriented x 3 (person, place, & time)       Respiratory: No evidence of acute respiratory distress Eyes: PERLA Vitals: BP 124/68 (BP Location: Left Arm, Patient Position: Sitting, Cuff Size: Normal) Comment (Cuff Size): forearm  Pulse 72   Temp (!) 97.2 F (36.2 C) (Temporal)   Resp 16   Ht 5\' 7"  (1.702 m)   Wt 277 lb 9.6 oz (125.9 kg)   SpO2 100%   BMI 43.48 kg/m  BMI: Estimated body  mass index is 43.48 kg/m as calculated from the following:   Height as of this encounter: 5\' 7"  (1.702 m).   Weight as of this encounter: 277 lb 9.6 oz (125.9 kg). Ideal: Ideal body weight: 61.6 kg (135 lb 12.9 oz) Adjusted ideal body weight: 87.3 kg (192 lb 8.4 oz)  Thoracic Spine Area Exam  Skin & Axial Inspection: No masses, redness, or swelling Alignment: Symmetrical Functional ROM: Pain restricted ROM Stability: No instability detected Muscle Tone/Strength: Functionally intact. No obvious neuro-muscular anomalies detected. Sensory (Neurological): Unimpaired Muscle strength & Tone: No palpable anomalies  Bilateral shoulder pain  Right hand pain   Lumbar Exam  Skin & Axial Inspection: No masses, redness, or swelling Alignment: Symmetrical Functional ROM: Pain restricted ROM       Stability: No instability detected Muscle Tone/Strength: Functionally intact. No obvious neuro-muscular anomalies detected. Sensory (Neurological): Musculoskeletal pain pattern  Ambulation: Patient came in today in a wheel chair Gait: Modified gait pattern (slower gait speed, wider stride width, and longer stance duration) associated with morbid obesity Posture: Difficulty standing up straight, due to pain    Lower Extremity Exam      Side: Right lower extremity   Side: Left lower extremity  Stability: No instability observed           Stability: No instability observed          Skin & Extremity Inspection: Skin color, temperature, and hair growth are WNL. No peripheral edema or cyanosis. No masses, redness, swelling, asymmetry, or associated skin lesions. No contractures.   Skin & Extremity Inspection: Skin color, temperature, and hair growth are WNL. No peripheral edema or cyanosis. No masses, redness, swelling, asymmetry, or associated skin lesions. No contractures.  Functional ROM: Pain restricted ROM for all joints of the lower extremity           Functional ROM: Pain restricted ROM for all  joints of the lower extremity          Muscle Tone/Strength: Functionally intact. No obvious neuro-muscular anomalies detected.   Muscle Tone/Strength: Functionally intact. No obvious neuro-muscular anomalies detected.  Sensory (Neurological): Neurogenic pain pattern         Sensory (Neurological): Neurogenic pain pattern, musculoskeletal pain of left hip        DTR: Patellar: deferred today Achilles: deferred today Plantar: deferred today   DTR: Patellar: deferred today Achilles: deferred today Plantar: deferred today  Palpation: No palpable anomalies   Palpation: No palpable anomalies     Assessment   Status Diagnosis  Persistent Persistent Persistent 1. Chronic pain of right hand   2. Chronic pain syndrome   3. Lumbar spondylosis   4. Spinal stenosis, lumbar region, with neurogenic claudication       Plan of Care  [name] has a current medication list which includes the following long-term medication(s): albuterol, apixaban, atorvastatin, carvedilol, duloxetine, febuxostat, fluticasone, glucagon, glucose, toujeo max solostar, linaclotide, memantine, montelukast, novolog flexpen, pantoprazole, symbicort, and torsemide.  Pharmacotherapy (Medications Ordered): Meds  ordered this encounter  Medications   oxyCODONE-acetaminophen (PERCOCET) 7.5-325 MG tablet    Sig: Take 1 tablet by mouth every 12 (twelve) hours.    Dispense:  60 tablet    Refill:  0   oxyCODONE-acetaminophen (PERCOCET) 7.5-325 MG tablet    Sig: Take 1 tablet by mouth every 12 (twelve) hours.    Dispense:  60 tablet    Refill:  0   oxyCODONE-acetaminophen (PERCOCET) 7.5-325 MG tablet    Sig: Take 1 tablet by mouth every 12 (twelve) hours.    Dispense:  60 tablet    Refill:  0   Continue Linzess for constipation  No orders of the defined types were placed in this encounter.    Follow-up plan:   Return in about 3 months (around 12/08/2022) for Medication Management, in person.       Interventional management options:  Considering:   Intra-articular knee Hyalgan injections can be done in wheelchair Patient is on Eliquis and is morbidly obese and unable to get on fluoroscopy table for interventional therapy.   PRN Procedures:   None at this time      Recent Visits Date Type Provider Dept  06/15/22 Office Visit Edward Jolly, MD [facility]  Showing recent visits within past 90 days and meeting all other requirements Today's Visits Date Type Provider Dept  09/07/22 Office Visit Edward Jolly, MD [facility]  Showing today's visits and meeting all other requirements Future Appointments No visits were found meeting these conditions. Showing future appointments within next 90 days and meeting all other requirements  I discussed the assessment and treatment plan with the patient. The patient was provided an opportunity to ask questions and all were answered. The patient agreed with the plan and demonstrated an understanding of the instructions.  Patient advised to call back or seek an in-person evaluation if the symptoms or condition worsens.  Duration of encounter: 30 minutes.  Note by: Edward Jolly, MD Date: 09/07/2022; Time: 3:18 PM

## 2022-09-07 NOTE — Progress Notes (Signed)
Nursing Pain Medication Assessment:  Safety precautions to be maintained throughout the outpatient stay will include: orient to surroundings, keep bed in low position, maintain call bell within reach at all times, provide assistance with transfer out of bed and ambulation.  Medication Inspection Compliance: Pill count conducted under aseptic conditions, in front of the patient. Neither the pills nor the bottle was removed from the patient's sight at any time. Once count was completed pills were immediately returned to the patient in their original bottle.  Medication: Oxycodone/APAP Pill/Patch Count:  40 of 60 pills remain Pill/Patch Appearance: Markings consistent with prescribed medication Bottle Appearance: Standard pharmacy container. Clearly labeled. Filled Date: 04 / 06 / 2024 Last Medication intake:  Yesterday

## 2022-09-08 NOTE — Telephone Encounter (Signed)
LDTVM  

## 2022-09-08 NOTE — Telephone Encounter (Signed)
Pharmacy Patient Advocate Encounter  Prior Authorization has been approved  Effective dates: 09/07/22 through 09/05/23

## 2022-09-12 ENCOUNTER — Ambulatory Visit (HOSPITAL_COMMUNITY)
Admission: EM | Admit: 2022-09-12 | Discharge: 2022-09-12 | Disposition: A | Discharge status: Home / Self Care | Payer: PPO | Attending: Family Medicine | Admitting: Family Medicine

## 2022-09-12 ENCOUNTER — Other Ambulatory Visit: Payer: Self-pay

## 2022-09-12 ENCOUNTER — Emergency Department (HOSPITAL_COMMUNITY)
Admission: EM | Admit: 2022-09-12 | Discharge: 2022-09-12 | Disposition: A | Discharge status: Home / Self Care | Payer: PPO | Attending: Emergency Medicine | Admitting: Emergency Medicine

## 2022-09-12 ENCOUNTER — Emergency Department (HOSPITAL_COMMUNITY): Payer: PPO

## 2022-09-12 ENCOUNTER — Encounter (HOSPITAL_COMMUNITY): Payer: Self-pay | Admitting: *Deleted

## 2022-09-12 ENCOUNTER — Encounter (HOSPITAL_COMMUNITY): Payer: Self-pay

## 2022-09-12 DIAGNOSIS — E1122 Type 2 diabetes mellitus with diabetic chronic kidney disease: Secondary | ICD-10-CM | POA: Diagnosis not present

## 2022-09-12 DIAGNOSIS — N183 Chronic kidney disease, stage 3 unspecified: Secondary | ICD-10-CM | POA: Diagnosis not present

## 2022-09-12 DIAGNOSIS — I130 Hypertensive heart and chronic kidney disease with heart failure and stage 1 through stage 4 chronic kidney disease, or unspecified chronic kidney disease: Secondary | ICD-10-CM | POA: Diagnosis not present

## 2022-09-12 DIAGNOSIS — Z79899 Other long term (current) drug therapy: Secondary | ICD-10-CM | POA: Diagnosis not present

## 2022-09-12 DIAGNOSIS — I2510 Atherosclerotic heart disease of native coronary artery without angina pectoris: Secondary | ICD-10-CM | POA: Insufficient documentation

## 2022-09-12 DIAGNOSIS — F0390 Unspecified dementia without behavioral disturbance: Secondary | ICD-10-CM | POA: Insufficient documentation

## 2022-09-12 DIAGNOSIS — Z794 Long term (current) use of insulin: Secondary | ICD-10-CM | POA: Insufficient documentation

## 2022-09-12 DIAGNOSIS — I5032 Chronic diastolic (congestive) heart failure: Secondary | ICD-10-CM | POA: Diagnosis not present

## 2022-09-12 DIAGNOSIS — R519 Headache, unspecified: Secondary | ICD-10-CM

## 2022-09-12 DIAGNOSIS — N189 Chronic kidney disease, unspecified: Secondary | ICD-10-CM | POA: Diagnosis not present

## 2022-09-12 DIAGNOSIS — Z7901 Long term (current) use of anticoagulants: Secondary | ICD-10-CM | POA: Insufficient documentation

## 2022-09-12 DIAGNOSIS — Z853 Personal history of malignant neoplasm of breast: Secondary | ICD-10-CM | POA: Diagnosis not present

## 2022-09-12 DIAGNOSIS — J338 Other polyp of sinus: Secondary | ICD-10-CM

## 2022-09-12 LAB — CBC
HCT: 32.8 % — ABNORMAL LOW (ref 36.0–46.0)
Hemoglobin: 9.9 g/dL — ABNORMAL LOW (ref 12.0–15.0)
MCH: 27.9 pg (ref 26.0–34.0)
MCHC: 30.2 g/dL (ref 30.0–36.0)
MCV: 92.4 fL (ref 80.0–100.0)
Platelets: 250 10*3/uL (ref 150–400)
RBC: 3.55 MIL/uL — ABNORMAL LOW (ref 3.87–5.11)
RDW: 14.2 % (ref 11.5–15.5)
WBC: 14 10*3/uL — ABNORMAL HIGH (ref 4.0–10.5)
nRBC: 0 % (ref 0.0–0.2)

## 2022-09-12 LAB — BASIC METABOLIC PANEL
Anion gap: 12 (ref 5–15)
BUN: 86 mg/dL — ABNORMAL HIGH (ref 8–23)
CO2: 23 mmol/L (ref 22–32)
Calcium: 8.8 mg/dL — ABNORMAL LOW (ref 8.9–10.3)
Chloride: 101 mmol/L (ref 98–111)
Creatinine, Ser: 2.34 mg/dL — ABNORMAL HIGH (ref 0.44–1.00)
GFR, Estimated: 20 mL/min — ABNORMAL LOW (ref >60)
Glucose, Bld: 175 mg/dL — ABNORMAL HIGH (ref 70–99)
Potassium: 3.8 mmol/L (ref 3.5–5.1)
Sodium: 136 mmol/L (ref 135–145)

## 2022-09-12 MED ORDER — KETOROLAC TROMETHAMINE 15 MG/ML IJ SOLN
15.0000 mg | Freq: Once | INTRAMUSCULAR | Status: AC
  Administered 2022-09-12: 15 mg via INTRAMUSCULAR
  Filled 2022-09-12: qty 1

## 2022-09-12 MED ORDER — FLUTICASONE PROPIONATE 50 MCG/ACT NA SUSP: 2.0000 | Freq: Every day | NASAL | 0 refills | Status: DC

## 2022-09-12 NOTE — ED Notes (Signed)
IV taken out by trainer Morrie Sheldon NT

## 2022-09-12 NOTE — ED Triage Notes (Addendum)
C/O frontal "sinus" HA, facial pain, bilat ear pain onset approx 7 days ago. C/O feeling plugged bilat ears and rhinorrhea. States last night HA was so severe that she called EMS to her house - states was told it did not appear she was having a stroke, and pt declined transport to hospital. Has taken OTC sinus tab.  Upon reviewing meds, pt states she finished course of doxycycline that was called in by PCP "for my headache". Denies any recent falls or trauma to head.

## 2022-09-12 NOTE — ED Provider Notes (Signed)
Blood pressure 130/69, pulse 75, temperature 97.8 F (36.6 C), temperature source Oral, resp. rate 16, height 5\' 7"  (1.702 m), weight 125 kg, SpO2 100 %.  Assuming care from Dr. Rubin Payor.  In short, [name] is a 88 y.o. female with a chief complaint of Headache .  Refer to the original H&P for additional details.  The current plan of care is to follow up on CT head/labs and reassess.  06:15 PM  Patient feeling much better. Discussed scan findings and possible sinus polyp. She has an appointment with ENT established. Will try sinus saline spray OTC and Flonase x 2 weeks. Feeling well and stable for discharge.    Maia Plan, MD 09/12/22 [phone]

## 2022-09-12 NOTE — Discharge Instructions (Signed)
Were seen in the emergency room today with headache.  Your CT scans did not show anything serious but you do possibly have a sinus polyp developing which can cause some discomfort and other symptoms.  I would recommend following with an ear nose and throat doctor.  Have listed by on-call doctor for you here or you may keep your appointment in June.  You will need to call to make an appointment.  I am restarting your Flonase.  May also use saline spray for sinuses over-the-counter.  The pharmacy can help you with this.  Please follow closely with your primary care doctor.

## 2022-09-12 NOTE — ED Triage Notes (Signed)
Patient has had a headache for 1 week. Denies changes in vision. Feeling a little dizzy and nauseous.

## 2022-09-12 NOTE — ED Notes (Signed)
Patient is being discharged from the Urgent Care and sent to the Emergency Department via private vehicle with grandson . Per K. Harris, NP, patient is in need of higher level of care due to severe HA with progressive worsening, which failed treatment with allergy med and abx course. Patient is aware and verbalizes understanding of plan of care.  Vitals:   09/12/22 1307  BP: (!) 119/56  Pulse: 79  Resp: 20  Temp: (!) 97.5 F (36.4 C)  SpO2: 95%

## 2022-09-12 NOTE — ED Provider Notes (Signed)
[place] EMERGENCY DEPARTMENT AT [facility] Provider Note   CSN: [id number] Arrival date & time: 09/12/22  1415     History  Chief Complaint  Patient presents with   Headache    [name] is a 88 y.o. female.   Headache Patient has had a headache over the last week.  Frontal headache.  Mid head.  States feels like previous sinus pain.  Patient presents with family member.  Uncontrolled pain meds at home.  Has congestion.  Reviewing notes it appears as if she has had doxycycline for the same without relief.  Is on anticoagulation.    Past Medical History:  Diagnosis Date   Abdominal pain 01/26/2012   Acute kidney injury superimposed on chronic kidney disease (HCC) 12/18/2014   Allergy    takes Mucinex daily as needed   Anemia, unspecified    Anxiety    takes Clonazepam daily as needed   Anxiety associated with depression 06/08/2017   Arthritis    Breast cancer (HCC)    Breast cancer screening 02/19/2014   Breast mass 10/27/2014   Chronic diastolic CHF (congestive heart failure) (HCC)    takes Furosemide daily   Chronic pain syndrome 02/06/2015   CKD (chronic kidney disease), stage III (HCC)    Coronary artery disease    a. s/p IMI 2004 tx with BMS to RCA;  b. s/p Promus DES to RCA 2/12 c. abnormal nuc 2016 -> cath with med rx. d. NSTEMI 02/2020  mv CAD with severe ISR in pRCA and m/dRCA lesion both treated with DES.   Debility 08/01/2012   Dementia (HCC)    Depression    takes Cymbalta daily   Diabetes mellitus    insulin daily   DOE (dyspnea on exertion) 06/24/2014   Dysphagia, unspecified(787.20) 07/31/2012   Fungal dermatitis 11/13/2007   Qualifier: Diagnosis of  By: Amador Cunas  MD, Janett Labella    GERD (gastroesophageal reflux disease)    takes Protonix daily   Gout, unspecified    Headache    occasionally   History of blood clots    History of deep venous thrombosis 01/27/2012   History of pulmonary embolus (PE) 04/22/2014   Hyperkalemia  07/26/2017   Hyperlipidemia    takes Pravastatin daily   Hypertension    Insomnia    Joint pain    Joint swelling    Morbid obesity (HCC)    Multiple benign nevi 11/15/2016   Myocardial infarction (HCC) 2004   Non-STEMI (non-ST elevated myocardial infarction) (HCC) 02/15/2020   Numbness    Obstructive sleep apnea    does not wear cpap   Osteoarthritis    Osteoarthritis    Osteoarthrosis, unspecified whether generalized or localized, lower leg    Pain in the chest 12/31/2013   Pain, chronic    Personal history of other diseases of the digestive system 12/03/2006   Qualifier: Diagnosis of  By: Amador Cunas  MD, Janett Labella    Polymyalgia rheumatica [facility])    Presence of stent in right coronary artery    Pulmonary emboli (HCC) 01/2012   felt to need lifelong anticoagulation but Xarelto dc in 02/2020 due to need for DAPT   Situational depression 06/04/2009   Qualifier: Diagnosis of  By: Amador Cunas  MD, Janett Labella    Syncope, vasovagal 07/04/2021   Type 2 diabetes mellitus with hyperlipidemia (HCC) 02/16/2022   Urinary incontinence    takes Linzess daily   Vaginitis and vulvovaginitis 06/23/2016   Vertigo  hx of;was taking Meclizine if needed   Wears dentures     Home Medications Prior to Admission medications   Medication Sig Start Date End Date Taking? Authorizing Provider  albuterol (PROAIR HFA) 108 (90 Base) MCG/ACT inhaler Inhale 1-2 puffs into the lungs every 6 (six) hours as needed for wheezing or shortness of breath. 12/19/18   Sharon Seller, NP  apixaban (ELIQUIS) 5 MG TABS tablet Take 1 tablet (5 mg total) by mouth 2 (two) times daily. 04/05/22 03/31/23  [name], MD  atorvastatin (LIPITOR) 80 MG tablet TAKE 1 TABLET BY MOUTH EVERY DAY 01/11/22   [name], MD  benzonatate (TESSALON) 100 MG capsule Take 1 capsule (100 mg total) by mouth 2 (two) times daily as needed for cough. 09/05/22   [name], MD  carvedilol (COREG) 12.5 MG tablet TAKE 1  TABLET BY MOUTH 2 TIMES DAILY 06/09/22   [name], MD  cetirizine (ZYRTEC) 10 MG tablet Take 10 mg by mouth daily as needed for allergies or rhinitis.    [provider]  Continuous Blood Gluc Sensor (FREESTYLE LIBRE 14 DAY SENSOR) MISC PLACE 1 DEVICE ON THE SKIN AS DIRECTED EVERY 14 DAYS 02/27/22   Shamleffer, Konrad Dolores, MD  diclofenac Sodium (VOLTAREN) 1 % GEL Apply 2 g topically 4 (four) times daily. Patient taking differently: Apply 2 g topically 4 (four) times daily as needed (pain). 02/23/22   [name], MD  doxycycline (VIBRA-TABS) 100 MG tablet Take 1 tablet (100 mg total) by mouth 2 (two) times daily for 7 days. 09/05/22 09/12/22  [name], MD  DULoxetine (CYMBALTA) 30 MG capsule TAKE 1 CAPSULE BY MOUTH EVERY DAY 08/10/22   [name], MD  Elastic Bandages & Supports (B & B CARPAL TUNNEL BRACE) MISC Use brace on wrist as needed for wrist pain 04/24/22   [name], MD  febuxostat (ULORIC) 40 MG tablet Take 1 tablet (40 mg total) by mouth daily. 04/04/22 03/30/23  [name], MD  fluticasone (FLONASE) 50 MCG/ACT nasal spray Place 1 spray into both nostrils in the morning and at bedtime. Patient taking differently: Place 1 spray into both nostrils 2 (two) times daily as needed for rhinitis or allergies. 12/17/20   Ngetich, Dinah C, NP  folic acid (FOLVITE) 1 MG tablet TAKE 1 TABLET BY MOUTH EVERY DAY 06/09/22   [name], MD  glucagon 1 MG injection Inject 1 mg into the vein once as needed for up to 1 dose. 04/13/22   [name], MD  glucose 4 GM chewable tablet Chew 1 tablet (4 g total) by mouth as needed for low blood sugar. 04/05/22   [name], MD  guaifenesin (ROBITUSSIN) 100 MG/5ML syrup Take 200 mg by mouth 3 (three) times daily as needed for cough.    [provider]  insulin glargine, 2 Unit Dial, (TOUJEO MAX SOLOSTAR) 300 UNIT/ML Solostar Pen Inject 40 Units into the skin daily. Patient assistance  program provides. Patient reports 40 units 04/24/2022 05/31/22   Shamleffer, Konrad Dolores, MD  linaclotide [facility]) 145 MCG CAPS capsule Take 1 capsule (145 mcg total) by mouth daily before breakfast. 09/05/21   Sharon Seller, NP  meclizine (ANTIVERT) 25 MG tablet Take 1-2 tablets (25-50 mg total) by mouth 2 (two) times daily as needed for dizziness. 06/13/22   [name], MD  memantine (NAMENDA) 5 MG tablet TAKE 2 TABLETS BY MOUTH EVERY DAY and TAKE 1  TABLET BY MOUTH EVERY EVENING Patient taking differently: Take 5-10 mg by mouth See admin instructions. TAKE 2 TABLETS BY MOUTH EVERY DAY and TAKE 1 TABLET BY MOUTH EVERY EVENING 09/26/21   Sharon Seller, NP  montelukast (SINGULAIR) 10 MG tablet Take 1 tablet (10 mg total) by mouth at bedtime. 04/24/22   [name], MD  nitroGLYCERIN (NITROSTAT) 0.4 MG SL tablet Take one tablet under the tongue every 5 minutes as needed for chest pain 09/24/13   Sharon Seller, NP  NOVOLOG FLEXPEN 100 UNIT/ML FlexPen Inject 14 Units into the skin 2 (two) times daily with a meal. Max daily dose 70 units 04/03/22   Shamleffer, Konrad Dolores, MD  nystatin cream (MYCOSTATIN) Apply 1 Application topically 2 (two) times daily. 02/16/22   Reed, Tiffany L, DO  ondansetron (ZOFRAN) 4 MG tablet Take 1 tablet (4 mg total) by mouth every 8 (eight) hours as needed for nausea or vomiting. 03/14/22   [name], MD  oxyCODONE-acetaminophen (PERCOCET) 7.5-325 MG tablet Take 1 tablet by mouth every 12 (twelve) hours. 09/11/22 10/11/22  Edward Jolly, MD  oxyCODONE-acetaminophen (PERCOCET) 7.5-325 MG tablet Take 1 tablet by mouth every 12 (twelve) hours. 10/11/22 11/10/22  Edward Jolly, MD  oxyCODONE-acetaminophen (PERCOCET) 7.5-325 MG tablet Take 1 tablet by mouth every 12 (twelve) hours. 11/10/22 12/10/22  Edward Jolly, MD  pantoprazole (PROTONIX) 20 MG tablet TAKE 1 TABLET BY MOUTH EVERY DAY 05/22/22   [name], MD  Semaglutide,0.25 or 0.5MG /DOS,  (OZEMPIC, 0.25 OR 0.5 MG/DOSE,) 2 MG/3ML SOPN Inject 0.5 mg into the skin once a week. 08/21/22   Shamleffer, Konrad Dolores, MD  sodium polystyrene (SPS) 15 GM/60ML suspension TAKE 60 MLS BY MOUTH ONCE WEEKLY TO KEEP POTASSIUM DOWN 07/21/22   [name], MD  SYMBICORT 160-4.5 MCG/ACT inhaler Inhale 2 puffs by mouth twice daily 05/12/22   Sharon Seller, NP  torsemide (DEMADEX) 20 MG tablet TAKE 1 TABLET BY MOUTH EVERY DAY TAKE AN EXTRA DOSE FOR WEIGHT GAIN, 3LB IN 1 DAY OR 5LB IN 1 WEEK 09/06/22   [name], MD  Vitamin D, Ergocalciferol, (DRISDOL) 1.25 MG (50000 UNIT) CAPS capsule TAKE 1 CAPSULE BY MOUTH ONCE WEEKLY ON MONDAY 08/28/22   [name], MD      Allergies    Sulfonamide derivatives, Lokelma [sodium zirconium cyclosilicate], Aricept [donepezil hcl], and Tramadol    Review of Systems   Review of Systems  Neurological:  Positive for headaches.    Physical Exam Updated Vital Signs BP 130/69 (BP Location: Left Arm)   Pulse 75   Temp 97.8 F (36.6 C) (Oral)   Resp 16   Ht 5\' 7"  (1.702 m)   Wt 125 kg   SpO2 100%   BMI 43.16 kg/m  Physical Exam Vitals and nursing note reviewed.  Constitutional:      Appearance: She is well-developed.  HENT:     Head:     Comments: Tenderness over mid face/cheeks bilaterally.  No rash.  Eye movements intact. Cardiovascular:     Rate and Rhythm: Regular rhythm.  Musculoskeletal:     Cervical back: Neck supple.  Neurological:     Mental Status: She is alert. Mental status is at baseline.     ED Results / Procedures / Treatments   Labs (all labs ordered are listed, but only abnormal results are displayed) Labs Reviewed  BASIC METABOLIC PANEL  CBC    EKG None  Radiology No results found.  Procedures  Procedures    Medications Ordered in ED Medications - No data to display  ED Course/ Medical Decision Making/ A&P                             Medical Decision Making Amount and/or Complexity of  Data Reviewed Labs: ordered. Radiology: ordered.  Risk Prescription drug management.   Patient with headache.  Sinusitis type symptoms.  However differential diagnose includes sinus infection, intracranial hemorrhage, nonspecific facial pain.  Has been on antibiotics.  With comorbidities and already having been on antibiotics and decongestant will get head CT maxillofacial CT.  Will get basic blood work also due to diabetes        Final Clinical Impression(s) / ED Diagnoses Final diagnoses:  None    Rx / DC Orders ED Discharge Orders     None         Benjiman Core, MD 09/12/22 [phone]

## 2022-09-12 NOTE — ED Notes (Signed)
Assisted pt to bedside commode.- tolerated well.

## 2022-09-12 NOTE — ED Provider Notes (Signed)
[facility]    CSN: [id number] Arrival date & time: 09/12/22  1256      History   Chief Complaint Chief Complaint  Patient presents with   Headache    HPI [name] is a 88 y.o. female.   HPI Patient with a complex chronic medical history chronic kidney disease, breast cancer, CHF, type 2 diabetes Presents today with a 1 week history of a progressively worsening headache which she reports is localized to her face,  pressure behind the eyes, bilateral ear pressure, increased fatigue.  Patient called her PCP a week ago and complained of the same type of symptoms and was prescribed doxycycline for possible sinus infection.  Patient reports headache has progressively worsened since that time.  She is also taking allergy medicine and Tylenol without minimal relief of symptoms. Last night headache pain became so severe that she called EMS and was told by EMS provider that she was not having a stroke however was offered transport to the ED and declined.  Patient tells this Clinical research associate that she feels  like something is "swooshing around" in her head.He endorses associated dizziness and visual changes however she has some diminished vision which has been ongoing prior to the onset of headache. Past Medical History:  Diagnosis Date   Abdominal pain 01/26/2012   Acute kidney injury superimposed on chronic kidney disease (HCC) 12/18/2014   Allergy    takes Mucinex daily as needed   Anemia, unspecified    Anxiety    takes Clonazepam daily as needed   Anxiety associated with depression 06/08/2017   Arthritis    Breast cancer (HCC)    Breast cancer screening 02/19/2014   Breast mass 10/27/2014   Chronic diastolic CHF (congestive heart failure) (HCC)    takes Furosemide daily   Chronic pain syndrome 02/06/2015   CKD (chronic kidney disease), stage III (HCC)    Coronary artery disease    a. s/p IMI 2004 tx with BMS to RCA;  b. s/p Promus DES to RCA 2/12 c. abnormal nuc 2016 -> cath  with med rx. d. NSTEMI 02/2020  mv CAD with severe ISR in pRCA and m/dRCA lesion both treated with DES.   Debility 08/01/2012   Dementia (HCC)    Depression    takes Cymbalta daily   Diabetes mellitus    insulin daily   DOE (dyspnea on exertion) 06/24/2014   Dysphagia, unspecified(787.20) 07/31/2012   Fungal dermatitis 11/13/2007   Qualifier: Diagnosis of  By: Amador Cunas  MD, Janett Labella    GERD (gastroesophageal reflux disease)    takes Protonix daily   Gout, unspecified    Headache    occasionally   History of blood clots    History of deep venous thrombosis 01/27/2012   History of pulmonary embolus (PE) 04/22/2014   Hyperkalemia 07/26/2017   Hyperlipidemia    takes Pravastatin daily   Hypertension    Insomnia    Joint pain    Joint swelling    Morbid obesity (HCC)    Multiple benign nevi 11/15/2016   Myocardial infarction (HCC) 2004   Non-STEMI (non-ST elevated myocardial infarction) (HCC) 02/15/2020   Numbness    Obstructive sleep apnea    does not wear cpap   Osteoarthritis    Osteoarthritis    Osteoarthrosis, unspecified whether generalized or localized, lower leg    Pain in the chest 12/31/2013   Pain, chronic    Personal history of other diseases of the digestive system 12/03/2006  Qualifier: Diagnosis of  By: Amador Cunas  MD, Janett Labella    Polymyalgia rheumatica [facility])    Presence of stent in right coronary artery    Pulmonary emboli (HCC) 01/2012   felt to need lifelong anticoagulation but Xarelto dc in 02/2020 due to need for DAPT   Situational depression 06/04/2009   Qualifier: Diagnosis of  By: Amador Cunas  MD, Janett Labella    Syncope, vasovagal 07/04/2021   Type 2 diabetes mellitus with hyperlipidemia (HCC) 02/16/2022   Urinary incontinence    takes Linzess daily   Vaginitis and vulvovaginitis 06/23/2016   Vertigo    hx of;was taking Meclizine if needed   Wears dentures     Patient Active Problem List   Diagnosis Date Noted   Chronic left hip pain  06/13/2022   Dependent for medication administration 06/13/2022   Very limited skin mobility 05/29/2022   Swelling of right upper extremity 04/27/2022   Carpal tunnel syndrome on both sides 04/27/2022   Hypoglycemia 04/09/2022   Diabetic peripheral neuropathy associated with type 2 diabetes mellitus (HCC) 04/05/2022   GI bleed 03/27/2022   Acute upper GI bleed 03/26/2022   Black stool 03/26/2022   Acute deep vein thrombosis (DVT) of femoral vein of right lower extremity (HCC) 03/26/2022   Stage 3b chronic kidney disease (CKD) (HCC) 03/26/2022   COPD with acute exacerbation (HCC) 03/26/2022   History of anemia due to chronic kidney disease 03/26/2022   GAD (generalized anxiety disorder) 03/26/2022   Allergic rhinitis 03/26/2022   Type 2 diabetes mellitus with stage 4 chronic kidney disease, with long-term current use of insulin (HCC) 02/16/2022   Type 2 diabetes mellitus with diabetic neuropathy, with long-term current use of insulin (HCC) 02/16/2022   DM2 (diabetes mellitus, type 2) (HCC) 02/16/2022   Dermatosis papulosa nigra 02/09/2022   Positive D dimer 02/09/2022   CKD (chronic kidney disease), stage IV (HCC) 02/09/2022   Pneumobilia 02/08/2022   Severe episode of recurrent major depressive disorder, without psychotic features (HCC) 12/05/2021   AKI (acute kidney injury) (HCC) 11/22/2021   Acute on chronic diastolic (congestive) heart failure (HCC) 11/19/2021   Anxiety 11/10/2021   Breast cancer (HCC) 10/22/2021   Right elbow pain 04/27/2021   Chronic pain of right hand 04/27/2021   Iron deficiency anemia 09/13/2020   Bilateral primary osteoarthritis of knee 04/12/2020   Chronic radicular lumbar pain 03/18/2020   Lumbar spondylosis 03/18/2020   Spinal stenosis, lumbar region, with neurogenic claudication 03/18/2020   Moderate nonproliferative diabetic retinopathy of both eyes without macular edema associated with type 2 diabetes mellitus (HCC) 08/27/2019   Type 2 diabetes  mellitus with hyperlipidemia (HCC) 12/13/2017   GERD with esophagitis 11/15/2016   Dementia (HCC) 11/15/2016   Endometrial polyp 06/23/2016   Vitamin D deficiency 08/02/2015   Acute kidney injury superimposed on chronic kidney disease (HCC) 12/18/2014   Gout 10/27/2014   Vertigo 09/24/2014   CN (constipation) 05/10/2014   COPD with chronic bronchitis 04/21/2014   Estrogen deficiency 02/19/2014   Atypical chest pain 12/31/2013   Insomnia 09/17/2012   Long term current use of anticoagulant therapy 02/02/2012   Chronic diastolic CHF (congestive heart failure) (HCC) 01/29/2012   Dyspnea 10/12/2010   Class 3 obesity (HCC) 02/15/2009   Anemia 02/15/2009   TENSION HEADACHE 01/07/2008   Hyperlipidemia 12/03/2006   Essential hypertension 12/03/2006    Past Surgical History:  Procedure Laterality Date   ABDOMINAL HYSTERECTOMY     partial   APPENDECTOMY     blood clots/legs  and lungs  2013   BREAST BIOPSY Left 07/22/2014   BREAST BIOPSY Left 02/10/2013   BREAST LUMPECTOMY Left 11/05/2014   BREAST LUMPECTOMY WITH RADIOACTIVE SEED LOCALIZATION Left 11/05/2014   Procedure: LEFT BREAST LUMPECTOMY WITH RADIOACTIVE SEED LOCALIZATION;  Surgeon: Abigail Miyamoto, MD;  Location: [facility] OR;  Service: General;  Laterality: Left;   CARDIAC CATHETERIZATION     COLONOSCOPY     CORONARY ANGIOPLASTY  2   CORONARY STENT INTERVENTION N/A 02/17/2020   Procedure: CORONARY STENT INTERVENTION;  Surgeon: Marykay Lex, MD;  Location: [facility] INVASIVE CV LAB;  Service: Cardiovascular;  Laterality: N/A;   ESOPHAGOGASTRODUODENOSCOPY (EGD) WITH PROPOFOL N/A 11/07/2016   Procedure: ESOPHAGOGASTRODUODENOSCOPY (EGD) WITH PROPOFOL;  Surgeon: Iva Boop, MD;  Location: [facility] ENDOSCOPY;  Service: Endoscopy;  Laterality: N/A;   EXCISION OF SKIN TAG Right 11/05/2014   Procedure: EXCISION OF RIGHT EYELID SKIN TAG;  Surgeon: Abigail Miyamoto, MD;  Location: [facility] OR;  Service: General;  Laterality: Right;   EYE SURGERY Bilateral     cataract    GASTRIC BYPASS  1977    reversed in 1979, [facility]   LEFT HEART CATH AND CORONARY ANGIOGRAPHY N/A 02/17/2020   Procedure: LEFT HEART CATH AND CORONARY ANGIOGRAPHY;  Surgeon: Marykay Lex, MD;  Location: [facility] INVASIVE CV LAB;  Service: Cardiovascular;  Laterality: N/A;   LEFT HEART CATHETERIZATION WITH CORONARY ANGIOGRAM N/A 06/29/2014   Procedure: LEFT HEART CATHETERIZATION WITH CORONARY ANGIOGRAM;  Surgeon: Lennette Bihari, MD;  Location: [facility] CATH LAB;  Service: Cardiovascular;  Laterality: N/A;   MEMBRANE PEEL Right 10/23/2018   Procedure: MEMBRANE PEEL;  Surgeon: Edmon Crape, MD;  Location: [facility] OR;  Service: Ophthalmology;  Laterality: Right;   MI with stent placement  2004   PARS PLANA VITRECTOMY Right 10/23/2018   Procedure: PARS PLANA VITRECTOMY WITH 25 GAUGE;  Surgeon: Edmon Crape, MD;  Location: [facility] OR;  Service: Ophthalmology;  Laterality: Right;    OB History   No obstetric history on file.      Home Medications    Prior to Admission medications   Medication Sig Start Date End Date Taking? Authorizing Provider  albuterol (PROAIR HFA) 108 (90 Base) MCG/ACT inhaler Inhale 1-2 puffs into the lungs every 6 (six) hours as needed for wheezing or shortness of breath. 12/19/18  Yes Sharon Seller, NP  apixaban (ELIQUIS) 5 MG TABS tablet Take 1 tablet (5 mg total) by mouth 2 (two) times daily. 04/05/22 03/31/23 Yes [name], MD  atorvastatin (LIPITOR) 80 MG tablet TAKE 1 TABLET BY MOUTH EVERY DAY 01/11/22  Yes [name], MD  carvedilol (COREG) 12.5 MG tablet TAKE 1 TABLET BY MOUTH 2 TIMES DAILY 06/09/22  Yes [name], MD  doxycycline (VIBRA-TABS) 100 MG tablet Take 1 tablet (100 mg total) by mouth 2 (two) times daily for 7 days. 09/05/22 09/12/22 Yes [name], MD  DULoxetine (CYMBALTA) 30 MG capsule TAKE 1 CAPSULE BY MOUTH EVERY DAY 08/10/22  Yes [name], MD  febuxostat (ULORIC) 40 MG tablet Take 1 tablet (40 mg total) by  mouth daily. 04/04/22 03/30/23 Yes [name], MD  folic acid (FOLVITE) 1 MG tablet TAKE 1 TABLET BY MOUTH EVERY DAY 06/09/22  Yes [name], MD  insulin glargine, 2 Unit Dial, (TOUJEO MAX SOLOSTAR) 300 UNIT/ML Solostar Pen Inject 40 Units into the skin daily. Patient assistance program provides. Patient reports 40 units 04/24/2022 05/31/22  Yes Shamleffer, Konrad Dolores, MD  linaclotide (LINZESS) 145 MCG CAPS capsule Take 1 capsule (145 mcg total) by mouth daily before breakfast. 09/05/21  Yes Eubanks, Janene Harvey, NP  memantine (NAMENDA) 5 MG tablet TAKE 2 TABLETS BY MOUTH EVERY DAY and TAKE 1 TABLET BY MOUTH EVERY EVENING Patient taking differently: Take 5-10 mg by mouth See admin instructions. TAKE 2 TABLETS BY MOUTH EVERY DAY and TAKE 1 TABLET BY MOUTH EVERY EVENING 09/26/21  Yes Eubanks, Janene Harvey, NP  NOVOLOG FLEXPEN 100 UNIT/ML FlexPen Inject 14 Units into the skin 2 (two) times daily with a meal. Max daily dose 70 units 04/03/22  Yes Shamleffer, Konrad Dolores, MD  oxyCODONE-acetaminophen (PERCOCET) 7.5-325 MG tablet Take 1 tablet by mouth every 12 (twelve) hours. 10/11/22 11/10/22 Yes Edward Jolly, MD  pantoprazole (PROTONIX) 20 MG tablet TAKE 1 TABLET BY MOUTH EVERY DAY 05/22/22  Yes [name], MD  Semaglutide,0.25 or 0.5MG /DOS, (OZEMPIC, 0.25 OR 0.5 MG/DOSE,) 2 MG/3ML SOPN Inject 0.5 mg into the skin once a week. 08/21/22  Yes Shamleffer, Konrad Dolores, MD  SYMBICORT 160-4.5 MCG/ACT inhaler Inhale 2 puffs by mouth twice daily 05/12/22  Yes Eubanks, Janene Harvey, NP  torsemide (DEMADEX) 20 MG tablet TAKE 1 TABLET BY MOUTH EVERY DAY TAKE AN EXTRA DOSE FOR WEIGHT GAIN, 3LB IN 1 DAY OR 5LB IN 1 WEEK 09/06/22  Yes [name], MD  Vitamin D, Ergocalciferol, (DRISDOL) 1.25 MG (50000 UNIT) CAPS capsule TAKE 1 CAPSULE BY MOUTH ONCE WEEKLY ON MONDAY 08/28/22  Yes [name], MD  benzonatate (TESSALON) 100 MG capsule Take 1 capsule (100 mg total) by mouth 2 (two) times daily as  needed for cough. 09/05/22   [name], MD  cetirizine (ZYRTEC) 10 MG tablet Take 10 mg by mouth daily as needed for allergies or rhinitis.    [provider]  Continuous Blood Gluc Sensor (FREESTYLE LIBRE 14 DAY SENSOR) MISC PLACE 1 DEVICE ON THE SKIN AS DIRECTED EVERY 14 DAYS 02/27/22   Shamleffer, Konrad Dolores, MD  diclofenac Sodium (VOLTAREN) 1 % GEL Apply 2 g topically 4 (four) times daily. Patient taking differently: Apply 2 g topically 4 (four) times daily as needed (pain). 02/23/22   [name], MD  Elastic Bandages & Supports (B & B CARPAL TUNNEL BRACE) MISC Use brace on wrist as needed for wrist pain 04/24/22   [name], MD  fluticasone [facility]) 50 MCG/ACT nasal spray Place 2 sprays into both nostrils daily for 14 days. 09/12/22 09/26/22  Long, Arlyss Repress, MD  glucagon 1 MG injection Inject 1 mg into the vein once as needed for up to 1 dose. 04/13/22   [name], MD  glucose 4 GM chewable tablet Chew 1 tablet (4 g total) by mouth as needed for low blood sugar. 04/05/22   [name], MD  guaifenesin (ROBITUSSIN) 100 MG/5ML syrup Take 200 mg by mouth 3 (three) times daily as needed for cough.    [provider]  meclizine (ANTIVERT) 25 MG tablet Take 1-2 tablets (25-50 mg total) by mouth 2 (two) times daily as needed for dizziness. 06/13/22   [name], MD  montelukast (SINGULAIR) 10 MG tablet Take 1 tablet (10 mg total) by mouth at bedtime. 04/24/22   [name], MD  nitroGLYCERIN (NITROSTAT) 0.4 MG SL tablet Take one tablet under the tongue every 5 minutes as needed for chest pain 09/24/13   Sharon Seller, NP  nystatin cream (MYCOSTATIN) Apply 1 Application topically 2 (two) times  daily. 02/16/22   Reed, Tiffany L, DO  ondansetron (ZOFRAN) 4 MG tablet Take 1 tablet (4 mg total) by mouth every 8 (eight) hours as needed for nausea or vomiting. 03/14/22   [name], MD  oxyCODONE-acetaminophen (PERCOCET) 7.5-325 MG  tablet Take 1 tablet by mouth every 12 (twelve) hours. 09/11/22 10/11/22  Edward Jolly, MD  oxyCODONE-acetaminophen (PERCOCET) 7.5-325 MG tablet Take 1 tablet by mouth every 12 (twelve) hours. 11/10/22 12/10/22  Edward Jolly, MD  sodium polystyrene (SPS) 15 GM/60ML suspension TAKE 60 MLS BY MOUTH ONCE WEEKLY TO KEEP POTASSIUM DOWN 07/21/22   [name], MD    Family History Family History  Problem Relation Age of Onset   Breast cancer Mother 45   Heart disease Mother    Throat cancer Father    Hypertension Father    Arthritis Father    Diabetes Father    Arthritis Sister    Obesity Sister    Diabetes Sister    Heart disease Cousin    Colon cancer Neg Hx    Stomach cancer Neg Hx    Esophageal cancer Neg Hx     Social History Social History   Tobacco Use   Smoking status: Never    Passive exposure: Past   Smokeless tobacco: Never   Tobacco comments:    Both parents smoked, patient was exposed/ "Raised up in smoke, my whole life."  Vaping Use   Vaping Use: Never used  Substance Use Topics   Alcohol use: No    Alcohol/week: 0.0 standard drinks of alcohol   Drug use: No     Allergies   Sulfonamide derivatives, Lokelma [sodium zirconium cyclosilicate], Aricept [donepezil hcl], and Tramadol   Review of Systems Review of Systems Pertinent negatives listed in HPI   Physical Exam Triage Vital Signs ED Triage Vitals  Enc Vitals Group     BP 09/12/22 1307 (!) 119/56     Pulse Rate 09/12/22 1307 79     Resp 09/12/22 1307 20     Temp 09/12/22 1307 (!) 97.5 F (36.4 C)     Temp Source 09/12/22 1307 Oral     SpO2 09/12/22 1307 95 %     Weight --      Height --      Head Circumference --      Peak Flow --      Pain Score 09/12/22 1311 10     Pain Loc --      Pain Edu? --      Excl. in GC? --    No data found.  Updated Vital Signs BP (!) 119/56   Pulse 79   Temp (!) 97.5 F (36.4 C) (Oral)   Resp 20   SpO2 95%   Visual Acuity Right Eye Distance:   Left  Eye Distance:   Bilateral Distance:    Right Eye Near:   Left Eye Near:    Bilateral Near:     Physical Exam Constitutional:      Appearance: She is obese. She is ill-appearing.  HENT:     Head: Normocephalic.     Right Ear: Tympanic membrane normal.     Left Ear: Tympanic membrane normal.     Nose: Congestion and rhinorrhea present.     Mouth/Throat:     Lips: Pink.     Mouth: Mucous membranes are moist.  Eyes:     Extraocular Movements: Extraocular movements intact.     Pupils: Pupils are equal, round, and  reactive to light.  Cardiovascular:     Rate and Rhythm: Normal rate and regular rhythm.  Pulmonary:     Effort: Pulmonary effort is normal.     Breath sounds: Normal breath sounds.  Neurological:     Mental Status: She is alert and oriented to person, place, and time.     Gait: Gait normal.     Comments: Patient is nonambulatory in a wheelchair.      UC Treatments / Results  Labs (all labs ordered are listed, but only abnormal results are displayed) Labs Reviewed - No data to display  EKG   Radiology   Procedures Procedures (including critical care time)  Medications Ordered in UC Medications - No data to display  Initial Impression / Assessment and Plan / UC Course  I have reviewed the triage vital signs and the nursing notes.  Pertinent labs & imaging results that were available during my care of the patient were reviewed by me and considered in my medical decision making (see chart for details).    Given patient has recently been treated with an antibiotic, taken OTC headache relief medications , reports this is the worst headache that she is ever experienced, and continues to complain of worsening headache with associated dizziness, I am concern for possible acute intracranial abnormality sending patient to the ED for further workup and evaluation.  Patient is accompanied by her grandson who will transport her via personal automobile. Final Clinical  Impressions(s) / UC Diagnoses   Final diagnoses:  Severe headache     Discharge Instructions      Given that your head has become severe, worsening , and  ongoing for over a week and not improved with over-the-counter allergy medicine and antibiotic treatment you need further workup and evaluation for the source of the headache the setting of emergency department.     ED Prescriptions   None    PDMP not reviewed this encounter.   Bing Neighbors, NP 09/12/22 2046

## 2022-09-12 NOTE — Discharge Instructions (Addendum)
Given that your head has become severe, worsening , and  ongoing for over a week and not improved with over-the-counter allergy medicine and antibiotic treatment you need further workup and evaluation for the source of the headache the setting of emergency department.

## 2022-09-13 ENCOUNTER — Ambulatory Visit: Payer: Self-pay

## 2022-09-13 ENCOUNTER — Telehealth: Payer: Self-pay

## 2022-09-13 NOTE — Transitions of Care (Post Inpatient/ED Visit) (Signed)
09/13/2022  Name: [name] MRN: [id number] DOB: 12-20-1934  Today's TOC FU Call Status: Today's TOC FU Call Status:: Successful TOC FU Call Competed TOC FU Call Complete Date: 09/13/22  Transition Care Management Follow-up Telephone Call Date of Discharge: 09/12/22 Discharge Facility: [facility]) Type of Discharge: Emergency Department How have you been since you were released from the hospital?: Better Any questions or concerns?: No  Items Reviewed: Did you receive and understand the discharge instructions provided?: Yes Medications obtained,verified, and reconciled?: No Any new allergies since your discharge?: No Dietary orders reviewed?: NA Do you have support at home?: Yes People in Home: child(ren), adult Name of Support/Comfort Primary Source: PAMELA GORE  Medications Reviewed Today: Medications Reviewed Today     Reviewed by Lisette Grinder, RN (Registered Nurse) on 09/12/22 at 1315  Med List Status: <None>   Medication Order Taking? Sig Documenting Provider Last Dose Status Informant  albuterol (PROAIR HFA) 108 (90 Base) MCG/ACT inhaler [id number] Yes Inhale 1-2 puffs into the lungs every 6 (six) hours as needed for wheezing or shortness of breath. Sharon Seller, NP  Active Pharmacy Records, Child  apixaban (ELIQUIS) 5 MG TABS tablet [id number] Yes Take 1 tablet (5 mg total) by mouth 2 (two) times daily. [name], MD  Active   atorvastatin (LIPITOR) 80 MG tablet [id number] Yes TAKE 1 TABLET BY MOUTH EVERY DAY [name], MD  Active Child, Pharmacy Records  benzonatate (TESSALON) 100 MG capsule [id number]  Take 1 capsule (100 mg total) by mouth 2 (two) times daily as needed for cough. [name], MD  Consider Medication Status and Discontinue   carvedilol (COREG) 12.5 MG tablet [id number] Yes TAKE 1 TABLET BY MOUTH 2 TIMES DAILY [name], MD  Active   cetirizine (ZYRTEC) 10 MG tablet [id number]  Take 10 mg by mouth daily as needed  for allergies or rhinitis. [provider]  Active Pharmacy Records, Child  Continuous Blood Gluc Sensor (FREESTYLE LIBRE 14 DAY SENSOR) [place] [id number]  PLACE 1 DEVICE ON THE SKIN AS DIRECTED EVERY 14 DAYS Shamleffer, Konrad Dolores, MD  Active Child, Pharmacy Records  diclofenac Sodium (VOLTAREN) 1 % GEL [id number]  Apply 2 g topically 4 (four) times daily.  Patient taking differently: Apply 2 g topically 4 (four) times daily as needed (pain).   [name], MD  Active Child, Pharmacy Records  doxycycline (VIBRA-TABS) 100 MG tablet [id number] Yes Take 1 tablet (100 mg total) by mouth 2 (two) times daily for 7 days. [name], MD  Active            Med Note Lisette Grinder   Tue Sep 12, 2022  1:13 PM) Completed course yesterday  DULoxetine (CYMBALTA) 30 MG capsule [id number] Yes TAKE 1 CAPSULE BY MOUTH EVERY DAY [name], MD  Active   Elastic Bandages & Supports (B & B CARPAL TUNNEL BRACE) MISC [id number]  Use brace on wrist as needed for wrist pain [name], MD  Active   febuxostat (ULORIC) 40 MG tablet [id number] Yes Take 1 tablet (40 mg total) by mouth daily. [name], MD  Active   fluticasone [facility]) 50 MCG/ACT nasal spray [id number] Yes Place 1 spray into both nostrils in the morning and at bedtime.  Patient taking differently: Place 1 spray into both nostrils 2 (two) times daily as needed for rhinitis or allergies.   Ngetich, Donalee Citrin, NP  Active Pharmacy Records, Child  folic acid (  FOLVITE) 1 MG tablet [id number] Yes TAKE 1 TABLET BY MOUTH EVERY DAY [name], MD  Active   glucagon 1 MG injection [id number]  Inject 1 mg into the vein once as needed for up to 1 dose. [name], MD  Active   glucose 4 GM chewable tablet [id number]  Chew 1 tablet (4 g total) by mouth as needed for low blood sugar. [name], MD  Active   guaifenesin (ROBITUSSIN) 100 MG/5ML syrup [id number]  Take 200 mg by mouth 3 (three) times daily as needed  for cough. [provider]  Active Pharmacy Records, Child  insulin glargine, 2 Unit Dial, (TOUJEO MAX SOLOSTAR) 300 UNIT/ML Solostar Pen [id number] Yes Inject 40 Units into the skin daily. Patient assistance program provides. Patient reports 40 units 04/24/2022 Shamleffer, Konrad Dolores, MD  Active   linaclotide [facility]) 145 MCG CAPS capsule [id number] Yes Take 1 capsule (145 mcg total) by mouth daily before breakfast. Sharon Seller, NP  Active Pharmacy Records, Child  meclizine (ANTIVERT) 25 MG tablet [id number]  Take 1-2 tablets (25-50 mg total) by mouth 2 (two) times daily as needed for dizziness. [name], MD  Active   memantine [facility]) 5 MG tablet [id number] Yes TAKE 2 TABLETS BY MOUTH EVERY DAY and TAKE 1 TABLET BY MOUTH EVERY EVENING  Patient taking differently: Take 5-10 mg by mouth See admin instructions. TAKE 2 TABLETS BY MOUTH EVERY DAY and TAKE 1 TABLET BY MOUTH EVERY EVENING   Eubanks, Janene Harvey, NP  Active Pharmacy Records, Child  montelukast (SINGULAIR) 10 MG tablet [id number]  Take 1 tablet (10 mg total) by mouth at bedtime. [name], MD  Active   nitroGLYCERIN (NITROSTAT) 0.4 MG SL tablet [id number]  Take one tablet under the tongue every 5 minutes as needed for chest pain Sharon Seller, NP  Active Pharmacy Records, Child           Med Note Cristi Loron, Glorianne Manchester   Fri Oct 11, 2018  2:17 PM)    NOVOLOG FLEXPEN 100 UNIT/ML FlexPen [id number] Yes Inject 14 Units into the skin 2 (two) times daily with a meal. Max daily dose 70 units Shamleffer, Konrad Dolores, MD  Active   nystatin cream (MYCOSTATIN) [id number]  Apply 1 Application topically 2 (two) times daily. Kermit Balo, DO  Active Child, Pharmacy Records  ondansetron [facility]) 4 MG tablet [id number]  Take 1 tablet (4 mg total) by mouth every 8 (eight) hours as needed for nausea or vomiting. [name], MD  Active Child, Pharmacy Records  oxyCODONE-acetaminophen [facility]) 7.5-325 MG tablet  [id number]  Take 1 tablet by mouth every 12 (twelve) hours. Edward Jolly, MD  Active   oxyCODONE-acetaminophen (PERCOCET) 7.5-325 MG tablet [id number] Yes Take 1 tablet by mouth every 12 (twelve) hours. Edward Jolly, MD  Active   oxyCODONE-acetaminophen (PERCOCET) 7.5-325 MG tablet [id number]  Take 1 tablet by mouth every 12 (twelve) hours. Edward Jolly, MD  Consider Medication Status and Discontinue   pantoprazole (PROTONIX) 20 MG tablet [id number] Yes TAKE 1 TABLET BY MOUTH EVERY DAY [name], MD  Active   Semaglutide,0.25 or 0.5MG /DOS, (OZEMPIC, 0.25 OR 0.5 MG/DOSE,) 2 MG/3ML SOPN [id number] Yes Inject 0.5 mg into the skin once a week. Shamleffer, Konrad Dolores, MD  Active            Med Note Lisette Grinder   Tue Sep 12, 2022  1:15 PM) Started 1 wk ago  sodium polystyrene (SPS) 15 GM/60ML  suspension [id number]  TAKE 60 MLS BY MOUTH ONCE WEEKLY TO KEEP POTASSIUM DOWN [name], MD  Active   [facility] 160-4.5 MCG/ACT inhaler [id number] Yes Inhale 2 puffs by mouth twice daily Sharon Seller, NP  Active   torsemide (DEMADEX) 20 MG tablet [id number] Yes TAKE 1 TABLET BY MOUTH EVERY DAY TAKE AN EXTRA DOSE FOR WEIGHT GAIN, 3LB IN 1 DAY OR 5LB IN 1 WEEK [name], MD  Active   Vitamin D, Ergocalciferol, (DRISDOL) 1.25 MG (50000 UNIT) CAPS capsule [id number] Yes TAKE 1 CAPSULE BY MOUTH ONCE WEEKLY ON MONDAY [name], MD  Active   Med List Note Vernie Ammons, RN 06/15/22 1332): MR 10/01/22.            Home Care and Equipment/Supplies: Were Home Health Services Ordered?: NA Any new equipment or medical supplies ordered?: NA  Functional Questionnaire: Do you need assistance with bathing/showering or dressing?: Yes Do you need assistance with meal preparation?: Yes Do you need assistance with eating?: Yes Do you have difficulty maintaining continence: Yes Do you need assistance with getting out of bed/getting out of a chair/moving?: Yes Do you have  difficulty managing or taking your medications?: Yes  Follow up appointments reviewed: PCP Follow-up appointment confirmed?: NA Specialist Hospital Follow-up appointment confirmed?: NA Do you need transportation to your follow-up appointment?: No Do you understand care options if your condition(s) worsen?: Yes-patient verbalized understanding    SIGNATURE Arvil Persons, BSN, RN

## 2022-09-13 NOTE — Chronic Care Management (AMB) (Signed)
Error Please Disregard

## 2022-09-19 ENCOUNTER — Other Ambulatory Visit: Payer: PPO

## 2022-09-19 VITALS — BP 126/70 | HR 72 | Temp 98.3°F

## 2022-09-19 DIAGNOSIS — Z515 Encounter for palliative care: Secondary | ICD-10-CM

## 2022-09-19 NOTE — Progress Notes (Unsigned)
PATIENT NAME: [name] DOB: 06-11-1934 MRN: [id number]  PRIMARY CARE PROVIDER: [name], MD  RESPONSIBLE PARTY:  Acct ID - Guarantor Home Phone Work Phone Relationship Acct Type  [id number] LUCKY, THAKKER* [phone]  Self P/F     [address] [place], [place] [address]   Palliative Care Follow Up Encounter Note    Completed home visit. Daughter [name] also present.     Today's Visit: pt was very emotional and teary-eyed due to c/o headache and asking "how can you feel better when you can't feel better?"; LPN provided active listening   Respiratory: no SOB at this time; still SOB on exertion   Cardiac: HR regular; no edema noted   Cognitive: alert and oriented to place and self; has Dementia; Dementia booklet  discussed    Appetite: pt reports her appetite has decreased and she isn't eating much; encouraged pt to drink her protein drinks; still drinks sugar free liquids (ginger ale)   GI/GU: has irregular BMs; reports she had a BM on 4/10; no urinary issues; encouraged daughter to give stool softeners   Mobility: reports ability to walk from the bed to the recliner   ADLs: dependent on caregivers   Sleeping Pattern: sleeps well but wakes up for bathroom use on her BSC; able to go back to sleep   Pain: 8/10 pain to L shoulder; takes oxycodone for effective pain control; reports her shoulder has been hurting more lately and when she went to the Pain Management doctor (he gave her Oxycodone and she reports it makes her sick)   Diabetes: still takes blood sugar w/Libre 2; 126 at this time; pt reports BS is controlled  Has an ENT appt for constant headache on 10/16/22     Goals of Care: To stay in the home with caregivers   CODE STATUS: DNR  ADVANCED DIRECTIVES: N MOST FORM: Y PPS: 40%    Next Appt Scheduled For: 10/18/22 @ 1pm    PHYSICAL EXAM:   VITALS: Today's Vitals   09/19/22 1329  BP: 126/70  Pulse: 72  Temp: 98.3 F (36.8 C)  TempSrc:  Temporal  SpO2: 95%  PainSc: 8   PainLoc: Head    LUNGS: clear to auscultation , decreased breath sounds CARDIAC: Cor RRR EXTREMITIES: negative; limited ROM x4 SKIN: Skin color, texture, turgor normal. No rashes or lesions  NEURO: negative except for memory problems and weakness      Kjersti Dittmer Clementeen Graham, LPN

## 2022-09-29 ENCOUNTER — Ambulatory Visit: Payer: PPO | Admitting: Cardiology

## 2022-10-04 ENCOUNTER — Telehealth: Payer: Self-pay | Admitting: Family Medicine

## 2022-10-04 DIAGNOSIS — G8929 Other chronic pain: Secondary | ICD-10-CM

## 2022-10-04 NOTE — Telephone Encounter (Signed)
Pt called today and said her hip hurts and she would like a referral to a specialist

## 2022-10-05 NOTE — Telephone Encounter (Signed)
Left patient a detailed voice message regarding annotation below. Provided pt with referral information below and to return call to office for any concerns.   [facility] [address] [place] [address] [phone]

## 2022-10-09 ENCOUNTER — Other Ambulatory Visit: Payer: Self-pay | Admitting: Family Medicine

## 2022-10-09 ENCOUNTER — Other Ambulatory Visit: Payer: Self-pay | Admitting: Nurse Practitioner

## 2022-10-09 DIAGNOSIS — R413 Other amnesia: Secondary | ICD-10-CM

## 2022-10-11 ENCOUNTER — Ambulatory Visit (INDEPENDENT_AMBULATORY_CARE_PROVIDER_SITE_OTHER): Payer: PPO | Admitting: Orthopaedic Surgery

## 2022-10-11 ENCOUNTER — Other Ambulatory Visit (INDEPENDENT_AMBULATORY_CARE_PROVIDER_SITE_OTHER): Payer: PPO

## 2022-10-11 DIAGNOSIS — M25551 Pain in right hip: Secondary | ICD-10-CM

## 2022-10-11 NOTE — Progress Notes (Signed)
Office Visit Note   Patient: [name]           Date of Birth: [birth year]           MRN: [id number] Visit Date: 10/11/2022              Requested by: [name], MD [address] [place],  [place] [address] PCP: [name], MD   Assessment & Plan: Visit Diagnoses:  1. Pain of right hip     Plan: Impression is advanced right hip DJD and multilevel spondylosis of the lumbar spine.  I believe her pain is multifactorial but I think her back is probably contributing more to the pain.  We have discussed referral to Dr. Shon Baton for ultrasound-guided cortisone injection to the right hip joint.  If her symptoms do not improve, she will let us know we will make a referral to Dr. Alvester Morin for [facility].  Otherwise, follow-up as needed.  Follow-Up Instructions: Return if symptoms worsen or fail to improve.   Orders:  Orders Placed This Encounter  Procedures   XR HIP UNILAT W OR W/O PELVIS 2-3 VIEWS RIGHT   Ambulatory referral to Physical Medicine Rehab   No orders of the defined types were placed in this encounter.     Procedures: No procedures performed   Clinical Data: No additional findings.   Subjective: Chief Complaint  Patient presents with   Right Hip - Pain    HPI patient is an 88 year old female who comes in today with right lateral hip pain.  Pain starts in the right lateral leg and radiates to the right lower back and down to the foot.  Symptoms been ongoing for the past year but have worsened over the past month.  She is primarily wheelchair-bound but does stand for transfers.  The pain she is having is constant but worse when she is lying or sitting.  She is on chronic oxycodone by pain management.  She does note paresthesias to her right leg.  She denies any bowel or bladder change.  She previously underwent CT scan of the lumbar spine as she does not tolerate an MRI which showed advanced multilevel degenerative changes.  Review of Systems as  detailed in HPI.  All others reviewed and are negative.   Objective: Vital Signs: There were no vitals taken for this visit.  Physical Exam well-developed well-nourished female no acute distress.  Alert and oriented x 3.  Ortho Exam lumbar spine exam shows no spinous tenderness.  She does have right-sided paraspinous musculature tenderness to the lower lumbar level.  No increased pain with lumbar flexion or extension.  Negative straight leg raise.  Does have pain with logroll and FADIR.  She is neurovascularly intact distally.  Specialty Comments:  No specialty comments available.  Imaging: XR HIP UNILAT W OR W/O PELVIS 2-3 VIEWS RIGHT  Result Date: 10/11/2022 Advanced degenerative changes to the right hip joint    PMFS History: Patient Active Problem List   Diagnosis Date Noted   Chronic left hip pain 06/13/2022   Dependent for medication administration 06/13/2022   Very limited skin mobility 05/29/2022   Swelling of right upper extremity 04/27/2022   Carpal tunnel syndrome on both sides 04/27/2022   Hypoglycemia 04/09/2022   Diabetic peripheral neuropathy associated with type 2 diabetes mellitus (HCC) 04/05/2022   GI bleed 03/27/2022   Acute upper GI bleed 03/26/2022   Black stool 03/26/2022   Acute deep vein thrombosis (DVT) of femoral vein  of right lower extremity (HCC) 03/26/2022   Stage 3b chronic kidney disease (CKD) (HCC) 03/26/2022   COPD with acute exacerbation (HCC) 03/26/2022   History of anemia due to chronic kidney disease 03/26/2022   GAD (generalized anxiety disorder) 03/26/2022   Allergic rhinitis 03/26/2022   Type 2 diabetes mellitus with stage 4 chronic kidney disease, with long-term current use of insulin (HCC) 02/16/2022   Type 2 diabetes mellitus with diabetic neuropathy, with long-term current use of insulin (HCC) 02/16/2022   DM2 (diabetes mellitus, type 2) (HCC) 02/16/2022   Dermatosis papulosa nigra 02/09/2022   Positive D dimer 02/09/2022   CKD  (chronic kidney disease), stage IV (HCC) 02/09/2022   Pneumobilia 02/08/2022   Severe episode of recurrent major depressive disorder, without psychotic features (HCC) 12/05/2021   AKI (acute kidney injury) (HCC) 11/22/2021   Acute on chronic diastolic (congestive) heart failure (HCC) 11/19/2021   Anxiety 11/10/2021   Breast cancer (HCC) 10/22/2021   Right elbow pain 04/27/2021   Chronic pain of right hand 04/27/2021   Iron deficiency anemia 09/13/2020   Bilateral primary osteoarthritis of knee 04/12/2020   Chronic radicular lumbar pain 03/18/2020   Lumbar spondylosis 03/18/2020   Spinal stenosis, lumbar region, with neurogenic claudication 03/18/2020   Moderate nonproliferative diabetic retinopathy of both eyes without macular edema associated with type 2 diabetes mellitus (HCC) 08/27/2019   Type 2 diabetes mellitus with hyperlipidemia (HCC) 12/13/2017   GERD with esophagitis 11/15/2016   Dementia (HCC) 11/15/2016   Endometrial polyp 06/23/2016   Vitamin D deficiency 08/02/2015   Acute kidney injury superimposed on chronic kidney disease (HCC) 12/18/2014   Gout 10/27/2014   Vertigo 09/24/2014   CN (constipation) 05/10/2014   COPD with chronic bronchitis 04/21/2014   Estrogen deficiency 02/19/2014   Atypical chest pain 12/31/2013   Insomnia 09/17/2012   Long term current use of anticoagulant therapy 02/02/2012   Chronic diastolic CHF (congestive heart failure) (HCC) 01/29/2012   Dyspnea 10/12/2010   Class 3 obesity (HCC) 02/15/2009   Anemia 02/15/2009   TENSION HEADACHE 01/07/2008   Hyperlipidemia 12/03/2006   Essential hypertension 12/03/2006   Past Medical History:  Diagnosis Date   Abdominal pain 01/26/2012   Acute kidney injury superimposed on chronic kidney disease (HCC) 12/18/2014   Allergy    takes Mucinex daily as needed   Anemia, unspecified    Anxiety    takes Clonazepam daily as needed   Anxiety associated with depression 06/08/2017   Arthritis    Breast  cancer (HCC)    Breast cancer screening 02/19/2014   Breast mass 10/27/2014   Chronic diastolic CHF (congestive heart failure) (HCC)    takes Furosemide daily   Chronic pain syndrome 02/06/2015   CKD (chronic kidney disease), stage III (HCC)    Coronary artery disease    a. s/p IMI 2004 tx with BMS to RCA;  b. s/p Promus DES to RCA 2/12 c. abnormal nuc 2016 -> cath with med rx. d. NSTEMI 02/2020  mv CAD with severe ISR in pRCA and m/dRCA lesion both treated with DES.   Debility 08/01/2012   Dementia (HCC)    Depression    takes Cymbalta daily   Diabetes mellitus    insulin daily   DOE (dyspnea on exertion) 06/24/2014   Dysphagia, unspecified(787.20) 07/31/2012   Fungal dermatitis 11/13/2007   Qualifier: Diagnosis of  By: Amador Cunas  MD, Janett Labella    GERD (gastroesophageal reflux disease)    takes Protonix daily   Gout, unspecified  Headache    occasionally   History of blood clots    History of deep venous thrombosis 01/27/2012   History of pulmonary embolus (PE) 04/22/2014   Hyperkalemia 07/26/2017   Hyperlipidemia    takes Pravastatin daily   Hypertension    Insomnia    Joint pain    Joint swelling    Morbid obesity (HCC)    Multiple benign nevi 11/15/2016   Myocardial infarction [facility]) 2004   Non-STEMI (non-ST elevated myocardial infarction) (HCC) 02/15/2020   Numbness    Obstructive sleep apnea    does not wear cpap   Osteoarthritis    Osteoarthritis    Osteoarthrosis, unspecified whether generalized or localized, lower leg    Pain in the chest 12/31/2013   Pain, chronic    Personal history of other diseases of the digestive system 12/03/2006   Qualifier: Diagnosis of  By: Amador Cunas  MD, Janett Labella    Polymyalgia rheumatica [facility])    Presence of stent in right coronary artery    Pulmonary emboli (HCC) 01/2012   felt to need lifelong anticoagulation but Xarelto dc in 02/2020 due to need for DAPT   Situational depression 06/04/2009   Qualifier: Diagnosis of  By:  Amador Cunas  MD, Janett Labella    Syncope, vasovagal 07/04/2021   Type 2 diabetes mellitus with hyperlipidemia (HCC) 02/16/2022   Urinary incontinence    takes Linzess daily   Vaginitis and vulvovaginitis 06/23/2016   Vertigo    hx of;was taking Meclizine if needed   Wears dentures     Family History  Problem Relation Age of Onset   Breast cancer Mother 64   Heart disease Mother    Throat cancer Father    Hypertension Father    Arthritis Father    Diabetes Father    Arthritis Sister    Obesity Sister    Diabetes Sister    Heart disease Cousin    Colon cancer Neg Hx    Stomach cancer Neg Hx    Esophageal cancer Neg Hx     Past Surgical History:  Procedure Laterality Date   ABDOMINAL HYSTERECTOMY     partial   APPENDECTOMY     blood clots/legs and lungs  2013   BREAST BIOPSY Left 07/22/2014   BREAST BIOPSY Left 02/10/2013   BREAST LUMPECTOMY Left 11/05/2014   BREAST LUMPECTOMY WITH RADIOACTIVE SEED LOCALIZATION Left 11/05/2014   Procedure: LEFT BREAST LUMPECTOMY WITH RADIOACTIVE SEED LOCALIZATION;  Surgeon: Abigail Miyamoto, MD;  Location: [facility] OR;  Service: General;  Laterality: Left;   CARDIAC CATHETERIZATION     COLONOSCOPY     CORONARY ANGIOPLASTY  2   CORONARY STENT INTERVENTION N/A 02/17/2020   Procedure: CORONARY STENT INTERVENTION;  Surgeon: Marykay Lex, MD;  Location: [facility] INVASIVE CV LAB;  Service: Cardiovascular;  Laterality: N/A;   ESOPHAGOGASTRODUODENOSCOPY (EGD) WITH PROPOFOL N/A 11/07/2016   Procedure: ESOPHAGOGASTRODUODENOSCOPY (EGD) WITH PROPOFOL;  Surgeon: Iva Boop, MD;  Location: [facility] ENDOSCOPY;  Service: Endoscopy;  Laterality: N/A;   EXCISION OF SKIN TAG Right 11/05/2014   Procedure: EXCISION OF RIGHT EYELID SKIN TAG;  Surgeon: Abigail Miyamoto, MD;  Location: [facility] OR;  Service: General;  Laterality: Right;   EYE SURGERY Bilateral    cataract    GASTRIC BYPASS  1977    reversed in [facility], [facility]   LEFT HEART CATH AND CORONARY ANGIOGRAPHY N/A  02/17/2020   Procedure: LEFT HEART CATH AND CORONARY ANGIOGRAPHY;  Surgeon: Marykay Lex, MD;  Location[facility] INVASIVE  CV LAB;  Service: Cardiovascular;  Laterality: N/A;   LEFT HEART CATHETERIZATION WITH CORONARY ANGIOGRAM N/A 06/29/2014   Procedure: LEFT HEART CATHETERIZATION WITH CORONARY ANGIOGRAM;  Surgeon: Lennette Bihari, MD;  Location: [facility] CATH LAB;  Service: Cardiovascular;  Laterality: N/A;   MEMBRANE PEEL Right 10/23/2018   Procedure: MEMBRANE PEEL;  Surgeon: Edmon Crape, MD;  Location: [facility] OR;  Service: Ophthalmology;  Laterality: Right;   MI with stent placement  2004   PARS PLANA VITRECTOMY Right 10/23/2018   Procedure: PARS PLANA VITRECTOMY WITH 25 GAUGE;  Surgeon: Edmon Crape, MD;  Location: [facility] OR;  Service: Ophthalmology;  Laterality: Right;   Social History   Occupational History   Occupation: retired  Tobacco Use   Smoking status: Never    Passive exposure: Past   Smokeless tobacco: Never   Tobacco comments:    Both parents smoked, patient was exposed/ "Raised up in smoke, my whole life."  Vaping Use   Vaping Use: Never used  Substance and Sexual Activity   Alcohol use: No    Alcohol/week: 0.0 standard drinks of alcohol   Drug use: No   Sexual activity: Not on file

## 2022-10-12 ENCOUNTER — Ambulatory Visit: Payer: PPO | Admitting: Orthopaedic Surgery

## 2022-10-12 ENCOUNTER — Ambulatory Visit (INDEPENDENT_AMBULATORY_CARE_PROVIDER_SITE_OTHER): Payer: PPO | Admitting: *Deleted

## 2022-10-12 DIAGNOSIS — I10 Essential (primary) hypertension: Secondary | ICD-10-CM

## 2022-10-12 DIAGNOSIS — N184 Chronic kidney disease, stage 4 (severe): Secondary | ICD-10-CM

## 2022-10-12 NOTE — Chronic Care Management (AMB) (Signed)
Chronic Care Management   CCM RN Visit Note  10/12/2022 Name: [name] MRN: [id number] DOB: 05-04-1935  Subjective: [name] is a 88 y.o. year old female who is a primary care patient of Nichole Gunner, MD. The patient was referred to the Chronic Care Management team for assistance with care management needs subsequent to provider initiation of CCM services and plan of care.    Today's Visit:  Engaged with patient by telephone for follow up visit.        Goals Addressed             This Visit's Progress    CCM (DIABETES) EXPECTED OUTCOME:  MONITOR, SELF-MANAGE AND REDUCE SYMPTOMS OF DIABETES       Current Barriers:  Knowledge Deficits related to Diabetes management Chronic Disease Management support and education needs related to Diabetes and diet Cognitive Deficits Per patient's daughter [name], CBG is monitored with [employer], states fasting ranges have been in low 100's with one hypoglycemic episode of 66, Toujeo has been decreased to 36 units, pt has glucose tablets on hand, is trying to eat 3 meals per day plus snack at bedtime. Daughter reports there was a home visit on 07/18/22 to discuss Hospice/ palliative care options and palliative will be following up again in a few weeks Daughter reports pt is taking Trazodone 50 mg at hs and this is helping with sleep  Planned Interventions: Reviewed medications with patient and discussed importance of medication adherence;        Counseled on importance of regular laboratory monitoring as prescribed;        Advised patient, providing education and rationale, to check cbg per CGM [employer] [place]  and record        call provider for findings outside established parameters;       Review of patient status, including review of consultants reports, relevant laboratory and other test results, and medications completed;       Advised patient to discuss any issues with blood sugar, hypoglycemia with provider;       Reviewed importance of eating 3 meals per day Reviewed carbohydrate modified diet Reviewed all upcoming scheduled appointments   Symptom Management: Take medications as prescribed   Attend all scheduled provider appointments Call pharmacy for medication refills 3-7 days in advance of running out of medications Attend church or other social activities Perform all self care activities independently  Perform IADL's (shopping, preparing meals, housekeeping, managing finances) independently Call provider office for new concerns or questions  check blood sugar at prescribed times: Freestyle LIbre  check feet daily for cuts, sores or redness enter blood sugar readings and medication or insulin into daily log take the blood sugar log to all doctor visits take the blood sugar meter to all doctor visits trim toenails straight across eat fish at least once per week fill half of plate with vegetables limit fast food meals to no more than 1 per week manage portion size prepare main meal at home 3 to 5 days each week read food labels for fat, fiber, carbohydrates and portion size keep feet up while sitting wash and dry feet carefully every day Try eating a snack at hs with protein and carbohydrate Please stay in contact with your doctor if you have any issues with blood sugar, hypoglycemia (low blood sugar) Follow RULE OF 15 for low blood sugar management:  How to treat low blood sugars (Blood sugar less than 70 mg/dl  Please follow the  RULE OF 15 for the treatment of hypoglycemia treatment (When your blood sugars are less than 70 mg/ dl) STEP  1:  Take 15 grams of carbohydrates when your blood sugar is low, which includes:   3-4 glucose tabs or  3-4 oz of juice or regular soda or  One tube of glucose gel STEP 2:  Recheck blood sugar in 15 minutes STEP 3:  If your blood sugar is still low at the 15 minute recheck ---then, go back to STEP 1 and treat again with another 15 grams of  carbohydrates  Follow Up Plan: Telephone follow up appointment with care management team member scheduled for:   01/02/23 at 945 am       CCM (HYPERTENSION) EXPECTED OUTCOME: MONITOR, SELF-MANAGE AND REDUCE SYMPTOMS OF HYPERTENSION       Current Barriers:  Knowledge Deficits related to Hypertension management Chronic Disease Management support and education needs related to Hypertension Cognitive Deficits-dementia Spoke with patient's daughter [name] who lives with patient, reports patient has "mild dementia", states pt needs assistance with all ADL's, IADL's, pt has WC, walker, shower seat and stays in bed a lot, had home health PT for strengthening, states pt is trying to do chair and bed exercises prescribed by physical therapist.  [name] is primary caregiver,  pt has another daughter that provides meals, pt refuses PACE program.  [name] denies any needs for social work at present, no new concerns reported. Blood pressure is not monitored at home.  Planned Interventions: Evaluation of current treatment plan related to hypertension self management and patient's adherence to plan as established by provider;   Reviewed medications with patient and discussed importance of compliance;  Advised patient, providing education and rationale, to monitor blood pressure daily and record, calling PCP for findings outside established parameters;  Advised patient to discuss any issues with blood pressure or medications with provider; Reviewed importance of adherence to low sodium diet and reading food labels Reinforced importance of changing positions q 2 hours  Symptom Management: Take medications as prescribed   Attend all scheduled provider appointments Call pharmacy for medication refills 3-7 days in advance of running out of medications Attend church or other social activities Perform all self care activities independently  Perform IADL's (shopping, preparing meals, housekeeping, managing finances)  independently Call provider office for new concerns or questions  check blood pressure weekly choose a place to take my blood pressure (home, clinic or office, retail store) write blood pressure results in a log or diary learn about high blood pressure keep a blood pressure log take blood pressure log to all doctor appointments call doctor for signs and symptoms of high blood pressure keep all doctor appointments take medications for blood pressure exactly as prescribed report new symptoms to your doctor eat more whole grains, fruits and vegetables, lean meats and healthy fats Follow low sodium diet- read labels for sodium content, limit/ avoid fast food Continue completing chair/ bed exercises prescribed by physical therapy Change positions every 2 hours  Follow Up Plan: Telephone follow up appointment with care management team member scheduled for:  01/02/23 at 945 am          Plan:Telephone follow up appointment with care management team member scheduled for:  upon care guide rescheduling.  Irving Shows RNC, BSN RN Case Manager [facility] [phone]

## 2022-10-12 NOTE — Patient Instructions (Signed)
Please call the care guide team at [phone] if you need to cancel or reschedule your appointment.   If you are experiencing a Mental Health or Behavioral Health Crisis or need someone to talk to, please call the Suicide and Crisis Lifeline: 988 call the [place] National Suicide Prevention Lifeline: 267-617-7616 or TTY: 706-680-2106 TTY 361-234-5798) to talk to a trained counselor call 1-800-273-TALK (toll free, 24 hour hotline) go to [facility] [address] [place] [phone]) call 911   Following is a copy of the CCM Program Consent:  CCM service includes personalized support from designated clinical staff supervised by the physician, including individualized plan of care and coordination with other care providers 24/7 contact phone numbers for assistance for urgent and routine care needs. Service will only be billed when office clinical staff spend 20 minutes or more in a month to coordinate care. Only one practitioner may furnish and bill the service in a calendar month. The patient may stop CCM services at amy time (effective at the end of the month) by phone call to the office staff. The patient will be responsible for cost sharing (co-pay) or up to 20% of the service fee (after annual deductible is met)  Following is a copy of your full provider care plan:   Goals Addressed             This Visit's Progress    CCM (DIABETES) EXPECTED OUTCOME:  MONITOR, SELF-MANAGE AND REDUCE SYMPTOMS OF DIABETES       Current Barriers:  Knowledge Deficits related to Diabetes management Chronic Disease Management support and education needs related to Diabetes and diet Cognitive Deficits Per patient's daughter [name], CBG is monitored with [employer], states fasting ranges have been in low 100's with one hypoglycemic episode of 66, Toujeo has been decreased to 36 units, pt has glucose tablets on hand, is trying to eat 3 meals per day plus  snack at bedtime. Daughter reports there was a home visit on 07/18/22 to discuss Hospice/ palliative care options and palliative will be following up again in a few weeks Daughter reports pt is taking Trazodone 50 mg at hs and this is helping with sleep  Planned Interventions: Reviewed medications with patient and discussed importance of medication adherence;        Counseled on importance of regular laboratory monitoring as prescribed;        Advised patient, providing education and rationale, to check cbg per CGM [employer] [place]  and record        call provider for findings outside established parameters;       Review of patient status, including review of consultants reports, relevant laboratory and other test results, and medications completed;       Advised patient to discuss any issues with blood sugar, hypoglycemia with provider;      Reviewed importance of eating 3 meals per day Reviewed carbohydrate modified diet Reviewed all upcoming scheduled appointments   Symptom Management: Take medications as prescribed   Attend all scheduled provider appointments Call pharmacy for medication refills 3-7 days in advance of running out of medications Attend church or other social activities Perform all self care activities independently  Perform IADL's (shopping, preparing meals, housekeeping, managing finances) independently Call provider office for new concerns or questions  check blood sugar at prescribed times: Freestyle LIbre  check feet daily for cuts, sores or redness enter blood sugar readings and medication or insulin into daily log take the blood sugar  log to all doctor visits take the blood sugar meter to all doctor visits trim toenails straight across eat fish at least once per week fill half of plate with vegetables limit fast food meals to no more than 1 per week manage portion size prepare main meal at home 3 to 5 days each week read food labels for fat, fiber,  carbohydrates and portion size keep feet up while sitting wash and dry feet carefully every day Try eating a snack at hs with protein and carbohydrate Please stay in contact with your doctor if you have any issues with blood sugar, hypoglycemia (low blood sugar) Follow RULE OF 15 for low blood sugar management:  How to treat low blood sugars (Blood sugar less than 70 mg/dl  Please follow the RULE OF 15 for the treatment of hypoglycemia treatment (When your blood sugars are less than 70 mg/ dl) STEP  1:  Take 15 grams of carbohydrates when your blood sugar is low, which includes:   3-4 glucose tabs or  3-4 oz of juice or regular soda or  One tube of glucose gel STEP 2:  Recheck blood sugar in 15 minutes STEP 3:  If your blood sugar is still low at the 15 minute recheck ---then, go back to STEP 1 and treat again with another 15 grams of carbohydrates  Follow Up Plan: Telephone follow up appointment with care management team member scheduled for:   01/02/23 at 945 am       CCM (HYPERTENSION) EXPECTED OUTCOME: MONITOR, SELF-MANAGE AND REDUCE SYMPTOMS OF HYPERTENSION       Current Barriers:  Knowledge Deficits related to Hypertension management Chronic Disease Management support and education needs related to Hypertension Cognitive Deficits-dementia Spoke with patient's daughter [name] who lives with patient, reports patient has "mild dementia", states pt needs assistance with all ADL's, IADL's, pt has WC, walker, shower seat and stays in bed a lot, had home health PT for strengthening, states pt is trying to do chair and bed exercises prescribed by physical therapist.  Elita Quick is primary caregiver,  pt has another daughter that provides meals, pt refuses PACE program.  [name] denies any needs for social work at present, no new concerns reported. Blood pressure is not monitored at home.  Planned Interventions: Evaluation of current treatment plan related to hypertension self management and patient's  adherence to plan as established by provider;   Reviewed medications with patient and discussed importance of compliance;  Advised patient, providing education and rationale, to monitor blood pressure daily and record, calling PCP for findings outside established parameters;  Advised patient to discuss any issues with blood pressure or medications with provider; Reviewed importance of adherence to low sodium diet and reading food labels Reinforced importance of changing positions q 2 hours  Symptom Management: Take medications as prescribed   Attend all scheduled provider appointments Call pharmacy for medication refills 3-7 days in advance of running out of medications Attend church or other social activities Perform all self care activities independently  Perform IADL's (shopping, preparing meals, housekeeping, managing finances) independently Call provider office for new concerns or questions  check blood pressure weekly choose a place to take my blood pressure (home, clinic or office, retail store) write blood pressure results in a log or diary learn about high blood pressure keep a blood pressure log take blood pressure log to all doctor appointments call doctor for signs and symptoms of high blood pressure keep all doctor appointments take medications for blood pressure  exactly as prescribed report new symptoms to your doctor eat more whole grains, fruits and vegetables, lean meats and healthy fats Follow low sodium diet- read labels for sodium content, limit/ avoid fast food Continue completing chair/ bed exercises prescribed by physical therapy Change positions every 2 hours  Follow Up Plan: Telephone follow up appointment with care management team member scheduled for:  01/02/23 at 945 am          Patient verbalizes understanding of instructions and care plan provided today and agrees to view in MyChart. Active MyChart status and patient understanding of how to access  instructions and care plan via MyChart confirmed with patient.  Telephone follow up appointment with care management team member scheduled for:  01/02/23 at 945 am

## 2022-10-16 DIAGNOSIS — J322 Chronic ethmoidal sinusitis: Secondary | ICD-10-CM | POA: Diagnosis not present

## 2022-10-16 DIAGNOSIS — H9313 Tinnitus, bilateral: Secondary | ICD-10-CM | POA: Diagnosis not present

## 2022-10-18 ENCOUNTER — Other Ambulatory Visit: Payer: PPO

## 2022-10-20 ENCOUNTER — Ambulatory Visit (INDEPENDENT_AMBULATORY_CARE_PROVIDER_SITE_OTHER): Payer: PPO | Admitting: Sports Medicine

## 2022-10-20 ENCOUNTER — Other Ambulatory Visit: Payer: Self-pay

## 2022-10-20 DIAGNOSIS — M1611 Unilateral primary osteoarthritis, right hip: Secondary | ICD-10-CM | POA: Diagnosis not present

## 2022-10-20 DIAGNOSIS — M25551 Pain in right hip: Secondary | ICD-10-CM | POA: Diagnosis not present

## 2022-10-20 MED ORDER — LIDOCAINE HCL 1 % IJ SOLN
4.0000 mL | INTRAMUSCULAR | Status: AC | PRN
Start: 2022-10-20 — End: 2022-10-20
  Administered 2022-10-20: 4 mL

## 2022-10-20 MED ORDER — METHYLPREDNISOLONE ACETATE 40 MG/ML IJ SUSP
40.0000 mg | INTRAMUSCULAR | Status: AC | PRN
Start: 2022-10-20 — End: 2022-10-20
  Administered 2022-10-20: 40 mg via INTRA_ARTICULAR

## 2022-10-20 NOTE — Progress Notes (Signed)
   Procedure Note  Patient: [name]             Date of Birth: [birth year]           MRN: [id number]             Visit Date: 10/20/2022  Procedures: Visit Diagnoses:  1. Unilateral primary osteoarthritis, right hip   2. Pain in right hip    Large Joint Inj: R hip joint on 10/20/2022 1:39 PM Indications: pain Details: 22 G 3.5 in needle, ultrasound-guided anterior approach Medications: 4 mL lidocaine 1 %; 40 mg methylPREDNISolone acetate 40 MG/ML Outcome: tolerated well, no immediate complications  Procedure: US-guided intra-articular hip injection, Right After discussion on risks/benefits/indications and informed verbal consent was obtained, a timeout was performed. Patient was lying supine on exam table. The hip was cleaned with betadine and alcohol swabs. Then utilizing ultrasound guidance, the patient's femoral head and neck junction was identified and subsequently injected with 4:1  lidocaine:depomedrol via an in-plane approach with ultrasound visualization of the injectate administered into the hip joint. Patient tolerated procedure well without immediate complications.  Procedure, treatment alternatives, risks and benefits explained, specific risks discussed. Consent was given by the patient. Immediately prior to procedure a time out was called to verify the correct patient, procedure, equipment, support staff and site/side marked as required. Patient was prepped and draped in the usual sterile fashion.    - Discussed with [name] that it's possible her pain could be coming from the hip or the back. She will let Dr. Roda Shutters know how helpful this hip injection was was over the coming 1 to 2 weeks.  If that is not helpful for any reason, likely will make referral to Dr. Alvester Morin for [facility]. She is agreeable to let us know.  Madelyn Brunner, DO Primary Care Sports Medicine Physician  [facility]  This note was dictated using Dragon naturally speaking software and may  contain errors in syntax, spelling, or content which have not been identified prior to signing this note.

## 2022-10-23 ENCOUNTER — Other Ambulatory Visit: Payer: Self-pay | Admitting: Family Medicine

## 2022-10-23 DIAGNOSIS — K219 Gastro-esophageal reflux disease without esophagitis: Secondary | ICD-10-CM

## 2022-10-23 NOTE — Telephone Encounter (Signed)
Chart supports rx. Last OV: 10/12/2022

## 2022-10-30 DIAGNOSIS — R220 Localized swelling, mass and lump, head: Secondary | ICD-10-CM | POA: Diagnosis not present

## 2022-10-30 DIAGNOSIS — J322 Chronic ethmoidal sinusitis: Secondary | ICD-10-CM | POA: Diagnosis not present

## 2022-11-01 DIAGNOSIS — N183 Chronic kidney disease, stage 3 unspecified: Secondary | ICD-10-CM | POA: Diagnosis not present

## 2022-11-01 DIAGNOSIS — N2581 Secondary hyperparathyroidism of renal origin: Secondary | ICD-10-CM | POA: Diagnosis not present

## 2022-11-01 DIAGNOSIS — E1122 Type 2 diabetes mellitus with diabetic chronic kidney disease: Secondary | ICD-10-CM | POA: Diagnosis not present

## 2022-11-01 DIAGNOSIS — D631 Anemia in chronic kidney disease: Secondary | ICD-10-CM | POA: Diagnosis not present

## 2022-11-01 DIAGNOSIS — I129 Hypertensive chronic kidney disease with stage 1 through stage 4 chronic kidney disease, or unspecified chronic kidney disease: Secondary | ICD-10-CM | POA: Diagnosis not present

## 2022-11-01 DIAGNOSIS — I509 Heart failure, unspecified: Secondary | ICD-10-CM | POA: Diagnosis not present

## 2022-11-02 ENCOUNTER — Other Ambulatory Visit: Payer: Self-pay | Admitting: Family Medicine

## 2022-11-02 ENCOUNTER — Other Ambulatory Visit: Payer: Self-pay | Admitting: Hematology

## 2022-11-02 DIAGNOSIS — E1142 Type 2 diabetes mellitus with diabetic polyneuropathy: Secondary | ICD-10-CM

## 2022-11-02 NOTE — Telephone Encounter (Signed)
Chart supports rx. Last OV: 09/05/2022 Next OV: [id number]

## 2022-11-05 DIAGNOSIS — I10 Essential (primary) hypertension: Secondary | ICD-10-CM

## 2022-11-05 DIAGNOSIS — N184 Chronic kidney disease, stage 4 (severe): Secondary | ICD-10-CM

## 2022-11-05 DIAGNOSIS — E1122 Type 2 diabetes mellitus with diabetic chronic kidney disease: Secondary | ICD-10-CM

## 2022-11-05 DIAGNOSIS — Z794 Long term (current) use of insulin: Secondary | ICD-10-CM

## 2022-11-16 DIAGNOSIS — H903 Sensorineural hearing loss, bilateral: Secondary | ICD-10-CM | POA: Diagnosis not present

## 2022-11-16 DIAGNOSIS — H9313 Tinnitus, bilateral: Secondary | ICD-10-CM | POA: Diagnosis not present

## 2022-11-27 ENCOUNTER — Other Ambulatory Visit: Payer: Self-pay | Admitting: Nurse Practitioner

## 2022-11-27 ENCOUNTER — Other Ambulatory Visit: Payer: Self-pay | Admitting: Internal Medicine

## 2022-11-27 DIAGNOSIS — E1140 Type 2 diabetes mellitus with diabetic neuropathy, unspecified: Secondary | ICD-10-CM

## 2022-11-27 DIAGNOSIS — K5903 Drug induced constipation: Secondary | ICD-10-CM

## 2022-11-28 DIAGNOSIS — J322 Chronic ethmoidal sinusitis: Secondary | ICD-10-CM | POA: Diagnosis not present

## 2022-11-28 DIAGNOSIS — J3489 Other specified disorders of nose and nasal sinuses: Secondary | ICD-10-CM | POA: Diagnosis not present

## 2022-11-29 ENCOUNTER — Ambulatory Visit (INDEPENDENT_AMBULATORY_CARE_PROVIDER_SITE_OTHER): Payer: PPO | Admitting: Internal Medicine

## 2022-11-29 ENCOUNTER — Encounter: Payer: Self-pay | Admitting: Internal Medicine

## 2022-11-29 VITALS — BP 110/66 | HR 68 | Temp 98.3°F | Ht 67.0 in | Wt 273.8 lb

## 2022-11-29 DIAGNOSIS — E113393 Type 2 diabetes mellitus with moderate nonproliferative diabetic retinopathy without macular edema, bilateral: Secondary | ICD-10-CM | POA: Diagnosis not present

## 2022-11-29 DIAGNOSIS — I5032 Chronic diastolic (congestive) heart failure: Secondary | ICD-10-CM

## 2022-11-29 DIAGNOSIS — K5903 Drug induced constipation: Secondary | ICD-10-CM | POA: Diagnosis not present

## 2022-11-29 DIAGNOSIS — E1142 Type 2 diabetes mellitus with diabetic polyneuropathy: Secondary | ICD-10-CM

## 2022-11-29 MED ORDER — FLUTICASONE PROPIONATE 50 MCG/ACT NA SUSP: 2.0000 | Freq: Every day | NASAL | 0 refills | Status: DC

## 2022-11-29 MED ORDER — DOCUSATE SODIUM 100 MG PO CAPS
100.0000 mg | ORAL_CAPSULE | Freq: Two times a day (BID) | ORAL | 1 refills | Status: DC
Start: 2022-11-29 — End: 2022-12-19

## 2022-11-29 NOTE — Patient Instructions (Addendum)
Start taking Linzess once daily until you produce a bowel movement

## 2022-11-29 NOTE — Progress Notes (Signed)
[facility] PRIMARY CARE [facility] [address] [place] [address] Dept: [phone] Dept Fax: [phone]  Acute Care Office Visit  Subjective:   [name] 1936/01/290 11/29/2022  Chief Complaint  Patient presents with   Constipation    Last Monday can't go on her own    HPI: Discussed the use of AI scribe software for clinical note transcription with the patient, who gave verbal consent to proceed.  History of Present Illness   The patient, with a history of hypertension, congestive heart failure, COPD, chronic kidney disease, type 2 diabetes, dementia, retinopathy, and neuropathy, presents for current complaint of constipation.  The last bowel movement was approximately five days ago.  She is on chronic pain medicine oxycodone.  The patient describes the constipation as causing significant discomfort, including abdominal pain. The patient states that she has taken 1 dose of the Linzess 5 days ago and did not have any bowel movement.  She states she does not like to take it every day as her daughter has to help clean her up.  She has not taken anything further for constipation.  She reports she has tried MiraLAX in the past and it did not help.  The patient also mentions frustration with their current living situation, including the need for assistance with personal care due to her co-morbidities.  The patient currently lives with her daughter, who takes care of her. [name] grandson is present at today's visit who cooperates that his mother cares for her.  He states that the patient will request to go to the bathroom or get a bath, but when the bath or toileting care is not done to the patient's standards she becomes frustrated.  The patient is requesting to have home health come and assist her at home.  The patient and her grandson expressed that this was a problem a while back, in which the patient made the decision to stay home versus  going into a nursing facility.  The patient decided to stay home and be taken care of by daughter.    The following portions of the patient's history were reviewed and updated as appropriate: past medical history, past surgical history, family history, social history, allergies, medications, and problem list.   Patient Active Problem List   Diagnosis Date Noted   Chronic left hip pain 06/13/2022   Dependent for medication administration 06/13/2022   Very limited skin mobility 05/29/2022   Swelling of right upper extremity 04/27/2022   Carpal tunnel syndrome on both sides 04/27/2022   Diabetic peripheral neuropathy associated with type 2 diabetes mellitus (HCC) 04/05/2022   GAD (generalized anxiety disorder) 03/26/2022   Allergic rhinitis 03/26/2022   Type 2 diabetes mellitus with stage 4 chronic kidney disease, with long-term current use of insulin (HCC) 02/16/2022   Type 2 diabetes mellitus with diabetic neuropathy, with long-term current use of insulin (HCC) 02/16/2022   Dermatosis papulosa nigra 02/09/2022   CKD (chronic kidney disease), stage IV (HCC) 02/09/2022   Pneumobilia 02/08/2022   Severe episode of recurrent major depressive disorder, without psychotic features (HCC) 12/05/2021   Breast cancer (HCC) 10/22/2021   Chronic pain of right hand 04/27/2021   Iron deficiency anemia 09/13/2020   Bilateral primary osteoarthritis of knee 04/12/2020   Chronic radicular lumbar pain 03/18/2020   Lumbar spondylosis 03/18/2020   Spinal stenosis, lumbar region, with neurogenic claudication 03/18/2020   Moderate nonproliferative diabetic retinopathy of both eyes without macular edema associated with type 2 diabetes mellitus (  HCC) 08/27/2019   Type 2 diabetes mellitus with hyperlipidemia (HCC) 12/13/2017   GERD with esophagitis 11/15/2016   Dementia (HCC) 11/15/2016   Endometrial polyp 06/23/2016   Vitamin D deficiency 08/02/2015   Acute kidney injury superimposed on chronic kidney  disease (HCC) 12/18/2014   Gout 10/27/2014   Vertigo 09/24/2014   CN (constipation) 05/10/2014   COPD with chronic bronchitis 04/21/2014   Estrogen deficiency 02/19/2014   Insomnia 09/17/2012   Long term current use of anticoagulant therapy 02/02/2012   Chronic diastolic CHF (congestive heart failure) (HCC) 01/29/2012   Class 3 obesity (HCC) 02/15/2009   TENSION HEADACHE 01/07/2008   Hyperlipidemia 12/03/2006   Essential hypertension 12/03/2006   Past Medical History:  Diagnosis Date   Abdominal pain 01/26/2012   Acute deep vein thrombosis (DVT) of femoral vein of right lower extremity (HCC) 03/26/2022   Acute kidney injury superimposed on chronic kidney disease (HCC) 12/18/2014   Acute upper GI bleed 03/26/2022   Allergy    takes Mucinex daily as needed   Anemia, unspecified    Anxiety    takes Clonazepam daily as needed   Anxiety associated with depression 06/08/2017   Arthritis    Breast cancer (HCC)    Breast cancer screening 02/19/2014   Breast mass 10/27/2014   Chronic diastolic CHF (congestive heart failure) (HCC)    takes Furosemide daily   Chronic pain syndrome 02/06/2015   CKD (chronic kidney disease), stage III (HCC)    Coronary artery disease    a. s/p IMI 2004 tx with BMS to RCA;  b. s/p Promus DES to RCA 2/12 c. abnormal nuc 2016 -> cath with med rx. d. NSTEMI 02/2020  mv CAD with severe ISR in pRCA and m/dRCA lesion both treated with DES.   Debility 08/01/2012   Dementia (HCC)    Depression    takes Cymbalta daily   Diabetes mellitus    insulin daily   DOE (dyspnea on exertion) 06/24/2014   Dysphagia, unspecified(787.20) 07/31/2012   Fungal dermatitis 11/13/2007   Qualifier: Diagnosis of  By: Amador Cunas  MD, Janett Labella    GERD (gastroesophageal reflux disease)    takes Protonix daily   Gout, unspecified    Headache    occasionally   History of anemia due to chronic kidney disease 03/26/2022   History of blood clots    History of deep venous thrombosis  01/27/2012   History of pulmonary embolus (PE) 04/22/2014   Hyperkalemia 07/26/2017   Hyperlipidemia    takes Pravastatin daily   Hypertension    Insomnia    Joint pain    Joint swelling    Morbid obesity (HCC)    Multiple benign nevi 11/15/2016   Myocardial infarction (HCC) 2004   Non-STEMI (non-ST elevated myocardial infarction) (HCC) 02/15/2020   Numbness    Obstructive sleep apnea    does not wear cpap   Osteoarthritis    Osteoarthritis    Osteoarthrosis, unspecified whether generalized or localized, lower leg    Pain in the chest 12/31/2013   Pain, chronic    Personal history of other diseases of the digestive system 12/03/2006   Qualifier: Diagnosis of  By: Amador Cunas  MD, Janett Labella    Polymyalgia rheumatica [facility])    Presence of stent in right coronary artery    Pulmonary emboli (HCC) 01/2012   felt to need lifelong anticoagulation but Xarelto dc in 02/2020 due to need for DAPT   Situational depression 06/04/2009   Qualifier: Diagnosis of  By:  Amador Cunas  MD, Janett Labella    Syncope, vasovagal 07/04/2021   Type 2 diabetes mellitus with hyperlipidemia (HCC) 02/16/2022   Urinary incontinence    takes Linzess daily   Vaginitis and vulvovaginitis 06/23/2016   Vertigo    hx of;was taking Meclizine if needed   Wears dentures    Past Surgical History:  Procedure Laterality Date   ABDOMINAL HYSTERECTOMY     partial   APPENDECTOMY     blood clots/legs and lungs  2013   BREAST BIOPSY Left 07/22/2014   BREAST BIOPSY Left 02/10/2013   BREAST LUMPECTOMY Left 11/05/2014   BREAST LUMPECTOMY WITH RADIOACTIVE SEED LOCALIZATION Left 11/05/2014   Procedure: LEFT BREAST LUMPECTOMY WITH RADIOACTIVE SEED LOCALIZATION;  Surgeon: Abigail Miyamoto, MD;  Location: [facility] OR;  Service: General;  Laterality: Left;   CARDIAC CATHETERIZATION     COLONOSCOPY     CORONARY ANGIOPLASTY  2   CORONARY STENT INTERVENTION N/A 02/17/2020   Procedure: CORONARY STENT INTERVENTION;  Surgeon: Marykay Lex,  MD;  Location: [facility] INVASIVE CV LAB;  Service: Cardiovascular;  Laterality: N/A;   ESOPHAGOGASTRODUODENOSCOPY (EGD) WITH PROPOFOL N/A 11/07/2016   Procedure: ESOPHAGOGASTRODUODENOSCOPY (EGD) WITH PROPOFOL;  Surgeon: Iva Boop, MD;  Location: [facility] ENDOSCOPY;  Service: Endoscopy;  Laterality: N/A;   EXCISION OF SKIN TAG Right 11/05/2014   Procedure: EXCISION OF RIGHT EYELID SKIN TAG;  Surgeon: Abigail Miyamoto, MD;  Location: [facility] OR;  Service: General;  Laterality: Right;   EYE SURGERY Bilateral    cataract    GASTRIC BYPASS  1977    reversed in [facility], [facility]   LEFT HEART CATH AND CORONARY ANGIOGRAPHY N/A 02/17/2020   Procedure: LEFT HEART CATH AND CORONARY ANGIOGRAPHY;  Surgeon: Marykay Lex, MD;  Location: [facility] INVASIVE CV LAB;  Service: Cardiovascular;  Laterality: N/A;   LEFT HEART CATHETERIZATION WITH CORONARY ANGIOGRAM N/A 06/29/2014   Procedure: LEFT HEART CATHETERIZATION WITH CORONARY ANGIOGRAM;  Surgeon: Lennette Bihari, MD;  Location: [facility] CATH LAB;  Service: Cardiovascular;  Laterality: N/A;   MEMBRANE PEEL Right 10/23/2018   Procedure: MEMBRANE PEEL;  Surgeon: Edmon Crape, MD;  Location: [facility] OR;  Service: Ophthalmology;  Laterality: Right;   MI with stent placement  2004   PARS PLANA VITRECTOMY Right 10/23/2018   Procedure: PARS PLANA VITRECTOMY WITH 25 GAUGE;  Surgeon: Edmon Crape, MD;  Location: [facility] OR;  Service: Ophthalmology;  Laterality: Right;   Family History  Problem Relation Age of Onset   Breast cancer Mother 28   Heart disease Mother    Throat cancer Father    Hypertension Father    Arthritis Father    Diabetes Father    Arthritis Sister    Obesity Sister    Diabetes Sister    Heart disease Cousin    Colon cancer Neg Hx    Stomach cancer Neg Hx    Esophageal cancer Neg Hx    Outpatient Medications Prior to Visit  Medication Sig Dispense Refill   albuterol (PROAIR HFA) 108 (90 Base) MCG/ACT inhaler Inhale 1-2 puffs into the lungs every 6 (six) hours as  needed for wheezing or shortness of breath. 6.7 g 1   apixaban (ELIQUIS) 5 MG TABS tablet Take 1 tablet (5 mg total) by mouth 2 (two) times daily. 180 tablet 3   atorvastatin (LIPITOR) 80 MG tablet TAKE 1 TABLET BY MOUTH EVERY DAY 90 tablet 3   benzonatate (TESSALON) 100 MG capsule Take 1 capsule (100 mg total) by mouth 2 (two)  times daily as needed for cough. 20 capsule 0   carvedilol (COREG) 12.5 MG tablet TAKE 1 TABLET BY MOUTH 2 TIMES DAILY 180 tablet 1   cetirizine (ZYRTEC) 10 MG tablet Take 10 mg by mouth daily as needed for allergies or rhinitis.     Continuous Glucose Sensor (FREESTYLE LIBRE 14 DAY SENSOR) MISC PLACE 1 DEVICE ON THE SKIN AS DIRECTED EVERY 14 DAYS 6 each 2   diclofenac Sodium (VOLTAREN) 1 % GEL Apply 2 g topically 4 (four) times daily. (Patient taking differently: Apply 2 g topically 4 (four) times daily as needed (pain).) 150 g 2   DULoxetine (CYMBALTA) 30 MG capsule TAKE 1 CAPSULE BY MOUTH EVERY DAY 90 capsule 0   Elastic Bandages & Supports (B & B CARPAL TUNNEL BRACE) MISC Use brace on wrist as needed for wrist pain 2 each 10   febuxostat (ULORIC) 40 MG tablet Take 1 tablet (40 mg total) by mouth daily. 90 tablet 3   folic acid (FOLVITE) 1 MG tablet TAKE 1 TABLET BY MOUTH EVERY DAY 90 tablet 1   glucagon 1 MG injection Inject 1 mg into the vein once as needed for up to 1 dose. 1 each 12   glucose 4 GM chewable tablet Chew 1 tablet (4 g total) by mouth as needed for low blood sugar. 50 tablet 12   guaifenesin (ROBITUSSIN) 100 MG/5ML syrup Take 200 mg by mouth 3 (three) times daily as needed for cough.     insulin glargine, 2 Unit Dial, (TOUJEO MAX SOLOSTAR) 300 UNIT/ML Solostar Pen Inject 40 Units into the skin daily. Patient assistance program provides. Patient reports 40 units 04/24/2022 (Patient taking differently: Inject 36 Units into the skin daily. Patient assistance program provides. Patient reports 40 units 04/24/2022) 12 mL 1   linaclotide (LINZESS) 145 MCG CAPS  capsule Take 1 capsule (145 mcg total) by mouth daily before breakfast. 30 capsule 2   meclizine (ANTIVERT) 25 MG tablet Take 1-2 tablets (25-50 mg total) by mouth 2 (two) times daily as needed for dizziness. 90 tablet 0   memantine (NAMENDA) 5 MG tablet TAKE 2 TABLETS BY MOUTH EVERY MORNING and TAKE 1 TABLET BY MOUTH EVERY EVENING 270 tablet 5   montelukast (SINGULAIR) 10 MG tablet Take 1 tablet (10 mg total) by mouth at bedtime. 30 tablet 3   nitroGLYCERIN (NITROSTAT) 0.4 MG SL tablet Take one tablet under the tongue every 5 minutes as needed for chest pain 50 tablet 0   NOVOLOG FLEXPEN 100 UNIT/ML FlexPen Inject 14 Units into the skin 2 (two) times daily with a meal. Max daily dose 70 units 60 mL 0   nystatin cream (MYCOSTATIN) Apply 1 Application topically 2 (two) times daily. 30 g 3   ondansetron (ZOFRAN) 4 MG tablet Take 1 tablet (4 mg total) by mouth every 8 (eight) hours as needed for nausea or vomiting. 20 tablet 0   oxyCODONE-acetaminophen (PERCOCET) 7.5-325 MG tablet Take 1 tablet by mouth every 12 (twelve) hours. 60 tablet 0   pantoprazole (PROTONIX) 20 MG tablet TAKE 1 TABLET BY MOUTH EVERY DAY 30 tablet 5   Semaglutide,0.25 or 0.5MG /DOS, (OZEMPIC, 0.25 OR 0.5 MG/DOSE,) 2 MG/3ML SOPN Inject 0.5 mg into the skin once a week. 3 mL 11   sodium polystyrene (SPS) 15 GM/60ML suspension TAKE 60 MLS BY MOUTH ONCE WEEKLY TO KEEP POTASSIUM DOWN 240 mL 0   SYMBICORT 160-4.5 MCG/ACT inhaler Inhale 2 puffs by mouth twice daily 11 g 0   torsemide (DEMADEX)  20 MG tablet TAKE 1 TABLET BY MOUTH EVERY DAY TAKE AN EXTRA DOSE FOR WEIGHT GAIN, 3LB IN 1 DAY OR 5LB IN 1 WEEK 30 tablet 0   Vitamin D, Ergocalciferol, (DRISDOL) 1.25 MG (50000 UNIT) CAPS capsule TAKE 1 CAPSULE BY MOUTH ONCE WEEKLY 4 capsule 1   fluticasone (FLONASE) 50 MCG/ACT nasal spray Place 2 sprays into both nostrils daily for 14 days. 1 g 0   No facility-administered medications prior to visit.   Allergies  Allergen Reactions    Sulfonamide Derivatives Swelling and Other (See Comments)    Mouth swelling- no resp issues noted   Lokelma [Sodium Zirconium Cyclosilicate] Other (See Comments)    Headache, stomach upset   Aricept [Donepezil Hcl] Diarrhea and Nausea And Vomiting    GI upset/loose stools   Tramadol Nausea And Vomiting     ROS: A complete ROS was performed with pertinent positives/negatives noted in the HPI. The remainder of the ROS are negative.    Objective:   Today's Vitals   11/29/22 1422  BP: 110/66  Pulse: 68  Temp: 98.3 F (36.8 C)  TempSrc: Temporal  SpO2: 96%  Weight: 273 lb 12.8 oz (124.2 kg)  Height: 5\' 7"  (1.702 m)    GENERAL: Well-appearing, in NAD. Well nourished.  SKIN: Pink, warm and dry. NECK: Trachea midline. Full ROM w/o pain or tenderness. No lymphadenopathy.  RESPIRATORY: Chest wall symmetrical. Respirations even and non-labored. Breath sounds clear to auscultation bilaterally.  CARDIAC: S1, S2 present, regular rate and rhythm. Peripheral pulses 2+ bilaterally.  GI: Abdomen soft, non-tender. Normoactive bowel sounds. No rebound tenderness. No hepatomegaly or splenomegaly. No CVA tenderness.  EXTREMITIES: Without clubbing, cyanosis, or edema.  PSYCH/MENTAL STATUS: Alert, oriented x 3. Cooperative, appropriate mood and affect.    No results found for any visits on 11/29/22.    Assessment & Plan:  Assessment and Plan    1. Drug-induced constipation - docusate sodium (COLACE) 100 MG capsule; Take 1 capsule (100 mg total) by mouth 2 (two) times daily.  Dispense: 180 capsule; Refill: 1 - take linzess once daily until BM is achieved  - concerning signs/symptoms discussed with patient and grandson who verbalized understanding and in agreement to take patient to ER if such symptoms occur  2. Chronic diastolic CHF (congestive heart failure) (HCC) - Ambulatory referral to Home Health  3. Moderate nonproliferative diabetic retinopathy of both eyes without macular edema  associated with type 2 diabetes mellitus (HCC) - Ambulatory referral to Home Health  4. Diabetic peripheral neuropathy associated with type 2 diabetes mellitus (HCC) - Ambulatory referral to Home Health   Home Health Care:   referral for home health evaluation to assess patient's needs and eligibility for services.      Meds ordered this encounter  Medications   fluticasone (FLONASE) 50 MCG/ACT nasal spray    Sig: Place 2 sprays into both nostrils daily for 14 days.    Dispense:  1 g    Refill:  0   docusate sodium (COLACE) 100 MG capsule    Sig: Take 1 capsule (100 mg total) by mouth 2 (two) times daily.    Dispense:  180 capsule    Refill:  1    Order Specific Question:   Supervising Provider    Answer:   [name] [[id number]]   Lab Orders  No laboratory test(s) ordered today   No images are attached to the encounter or orders placed in the encounter.  Return if symptoms worsen or fail  to improve.   Of note, portions of this note may have been created with voice recognition software Physicist, medical). While this note has been edited for accuracy, occasional wrong-word or 'sound-a-like' substitutions may have occurred due to the inherent limitations of voice recognition software.  Salvatore Decent, FNP

## 2022-11-30 ENCOUNTER — Telehealth: Payer: Self-pay | Admitting: Family Medicine

## 2022-11-30 NOTE — Telephone Encounter (Signed)
Pt called and would like for you to put [facility] prescription. Pt stated her old dr use to prescribe this to her

## 2022-12-01 ENCOUNTER — Other Ambulatory Visit: Payer: Self-pay | Admitting: Family

## 2022-12-01 DIAGNOSIS — K5903 Drug induced constipation: Secondary | ICD-10-CM

## 2022-12-01 MED ORDER — LINACLOTIDE 145 MCG PO CAPS
145.0000 ug | ORAL_CAPSULE | Freq: Every day | ORAL | 2 refills | Status: DC
Start: 2022-12-01 — End: 2022-12-19

## 2022-12-04 ENCOUNTER — Other Ambulatory Visit: Payer: Self-pay | Admitting: Family Medicine

## 2022-12-04 DIAGNOSIS — I130 Hypertensive heart and chronic kidney disease with heart failure and stage 1 through stage 4 chronic kidney disease, or unspecified chronic kidney disease: Secondary | ICD-10-CM | POA: Diagnosis not present

## 2022-12-04 DIAGNOSIS — K625 Hemorrhage of anus and rectum: Secondary | ICD-10-CM | POA: Diagnosis not present

## 2022-12-04 DIAGNOSIS — N184 Chronic kidney disease, stage 4 (severe): Secondary | ICD-10-CM | POA: Diagnosis not present

## 2022-12-04 DIAGNOSIS — E1122 Type 2 diabetes mellitus with diabetic chronic kidney disease: Secondary | ICD-10-CM | POA: Diagnosis not present

## 2022-12-04 DIAGNOSIS — M48062 Spinal stenosis, lumbar region with neurogenic claudication: Secondary | ICD-10-CM | POA: Diagnosis not present

## 2022-12-04 DIAGNOSIS — G309 Alzheimer's disease, unspecified: Secondary | ICD-10-CM | POA: Diagnosis not present

## 2022-12-04 DIAGNOSIS — E113393 Type 2 diabetes mellitus with moderate nonproliferative diabetic retinopathy without macular edema, bilateral: Secondary | ICD-10-CM | POA: Diagnosis not present

## 2022-12-04 DIAGNOSIS — F0283 Dementia in other diseases classified elsewhere, unspecified severity, with mood disturbance: Secondary | ICD-10-CM | POA: Diagnosis not present

## 2022-12-04 DIAGNOSIS — G8929 Other chronic pain: Secondary | ICD-10-CM | POA: Diagnosis not present

## 2022-12-04 DIAGNOSIS — F411 Generalized anxiety disorder: Secondary | ICD-10-CM | POA: Diagnosis not present

## 2022-12-04 DIAGNOSIS — F332 Major depressive disorder, recurrent severe without psychotic features: Secondary | ICD-10-CM | POA: Diagnosis not present

## 2022-12-04 DIAGNOSIS — E1140 Type 2 diabetes mellitus with diabetic neuropathy, unspecified: Secondary | ICD-10-CM | POA: Diagnosis not present

## 2022-12-04 DIAGNOSIS — M4726 Other spondylosis with radiculopathy, lumbar region: Secondary | ICD-10-CM | POA: Diagnosis not present

## 2022-12-04 DIAGNOSIS — F0394 Unspecified dementia, unspecified severity, with anxiety: Secondary | ICD-10-CM | POA: Diagnosis not present

## 2022-12-04 DIAGNOSIS — M1030 Gout due to renal impairment, unspecified site: Secondary | ICD-10-CM | POA: Diagnosis not present

## 2022-12-04 DIAGNOSIS — G4700 Insomnia, unspecified: Secondary | ICD-10-CM | POA: Diagnosis not present

## 2022-12-04 DIAGNOSIS — M170 Bilateral primary osteoarthritis of knee: Secondary | ICD-10-CM | POA: Diagnosis not present

## 2022-12-04 DIAGNOSIS — K5903 Drug induced constipation: Secondary | ICD-10-CM | POA: Diagnosis not present

## 2022-12-04 DIAGNOSIS — E1169 Type 2 diabetes mellitus with other specified complication: Secondary | ICD-10-CM | POA: Diagnosis not present

## 2022-12-04 DIAGNOSIS — F0393 Unspecified dementia, unspecified severity, with mood disturbance: Secondary | ICD-10-CM | POA: Diagnosis not present

## 2022-12-04 DIAGNOSIS — I5032 Chronic diastolic (congestive) heart failure: Secondary | ICD-10-CM | POA: Diagnosis not present

## 2022-12-04 DIAGNOSIS — M25552 Pain in left hip: Secondary | ICD-10-CM | POA: Diagnosis not present

## 2022-12-04 DIAGNOSIS — M79641 Pain in right hand: Secondary | ICD-10-CM | POA: Diagnosis not present

## 2022-12-04 DIAGNOSIS — D631 Anemia in chronic kidney disease: Secondary | ICD-10-CM | POA: Diagnosis not present

## 2022-12-04 DIAGNOSIS — J309 Allergic rhinitis, unspecified: Secondary | ICD-10-CM | POA: Diagnosis not present

## 2022-12-04 DIAGNOSIS — F0284 Dementia in other diseases classified elsewhere, unspecified severity, with anxiety: Secondary | ICD-10-CM | POA: Diagnosis not present

## 2022-12-04 DIAGNOSIS — F02818 Dementia in other diseases classified elsewhere, unspecified severity, with other behavioral disturbance: Secondary | ICD-10-CM | POA: Diagnosis not present

## 2022-12-04 DIAGNOSIS — J4489 Other specified chronic obstructive pulmonary disease: Secondary | ICD-10-CM | POA: Diagnosis not present

## 2022-12-04 DIAGNOSIS — E7849 Other hyperlipidemia: Secondary | ICD-10-CM | POA: Diagnosis not present

## 2022-12-04 DIAGNOSIS — D509 Iron deficiency anemia, unspecified: Secondary | ICD-10-CM | POA: Diagnosis not present

## 2022-12-04 NOTE — Telephone Encounter (Signed)
Chart supports rx. Last OV: 11/29/2022

## 2022-12-05 ENCOUNTER — Encounter (HOSPITAL_COMMUNITY): Payer: Self-pay

## 2022-12-05 ENCOUNTER — Other Ambulatory Visit: Payer: Self-pay | Admitting: Family Medicine

## 2022-12-05 ENCOUNTER — Emergency Department (HOSPITAL_COMMUNITY)
Admission: EM | Admit: 2022-12-05 | Discharge: 2022-12-05 | Discharge status: Against Medical Advice | Payer: PPO | Attending: Emergency Medicine | Admitting: Emergency Medicine

## 2022-12-05 ENCOUNTER — Other Ambulatory Visit: Payer: Self-pay

## 2022-12-05 DIAGNOSIS — R110 Nausea: Secondary | ICD-10-CM | POA: Diagnosis not present

## 2022-12-05 DIAGNOSIS — R109 Unspecified abdominal pain: Secondary | ICD-10-CM | POA: Diagnosis present

## 2022-12-05 DIAGNOSIS — K5900 Constipation, unspecified: Secondary | ICD-10-CM | POA: Insufficient documentation

## 2022-12-05 DIAGNOSIS — Z5321 Procedure and treatment not carried out due to patient leaving prior to being seen by health care provider: Secondary | ICD-10-CM | POA: Diagnosis not present

## 2022-12-05 NOTE — ED Triage Notes (Signed)
Pt arrived pov reporting Nausea and abdominal pain. States has not been able to have a BM. Reports last one was yesterday due to taking a laxative. Reports she is unable to go on her own typically but this time feels sick to her stomach. Reports "My stomach is vibrating". NAD in triage noted. Denies blood In stool. States noted one episode last week. Denies any other symptoms

## 2022-12-05 NOTE — ED Notes (Signed)
Reported to registration leaving prior to room placement. Returned labels and eloped.

## 2022-12-05 NOTE — Telephone Encounter (Signed)
Chart supports rx. Last OV: 11/29/2022

## 2022-12-06 ENCOUNTER — Ambulatory Visit: Payer: Self-pay

## 2022-12-06 ENCOUNTER — Ambulatory Visit: Payer: PPO | Admitting: Podiatry

## 2022-12-06 ENCOUNTER — Ambulatory Visit (INDEPENDENT_AMBULATORY_CARE_PROVIDER_SITE_OTHER): Payer: PPO | Admitting: *Deleted

## 2022-12-06 DIAGNOSIS — E1122 Type 2 diabetes mellitus with diabetic chronic kidney disease: Secondary | ICD-10-CM

## 2022-12-06 DIAGNOSIS — N184 Chronic kidney disease, stage 4 (severe): Secondary | ICD-10-CM

## 2022-12-06 DIAGNOSIS — I10 Essential (primary) hypertension: Secondary | ICD-10-CM

## 2022-12-06 DIAGNOSIS — Z794 Long term (current) use of insulin: Secondary | ICD-10-CM

## 2022-12-06 NOTE — Chronic Care Management (AMB) (Signed)
Error Please Disregard

## 2022-12-06 NOTE — Transitions of Care (Post Inpatient/ED Visit) (Cosign Needed)
12/06/2022  Name: [name] MRN: [id number] DOB: 03/11/1935  Today's TOC FU Call Status: Today's TOC FU Call Status:: Successful TOC FU Call Competed TOC FU Call Complete Date: 12/06/22  Transition Care Management Follow-up Telephone Call Date of Discharge: 12/05/22 Discharge Facility: [facility]) Type of Discharge: Emergency Department Reason for ED Visit: Other: How have you been since you were released from the hospital?: Same (still has chronic constipation, uses stool softener, probiotic) Any questions or concerns?: No  Items Reviewed: Did you receive and understand the discharge instructions provided?: Yes Medications obtained,verified, and reconciled?: Yes (Medications Reviewed) Any new allergies since your discharge?: No Dietary orders reviewed?: Yes Type of Diet Ordered:: carbohydrate modified, low sodium Do you have support at home?: Yes People in Home: child(ren), adult Name of Support/Comfort Primary Source: lives with adult daughter [name]  Medications Reviewed Today: Medications Reviewed Today     Reviewed by Audrie Gallus, RN (Registered Nurse) on 12/06/22 at 1047  Med List Status: <None>   Medication Order Taking? Sig Documenting Provider Last Dose Status Informant  albuterol (PROAIR HFA) 108 (90 Base) MCG/ACT inhaler [id number] Yes Inhale 1-2 puffs into the lungs every 6 (six) hours as needed for wheezing or shortness of breath. Sharon Seller, NP Taking Active Pharmacy Records, Child  apixaban (ELIQUIS) 5 MG TABS tablet [id number] Yes Take 1 tablet (5 mg total) by mouth 2 (two) times daily. [name], MD Taking Active   atorvastatin (LIPITOR) 80 MG tablet [id number] Yes TAKE 1 TABLET BY MOUTH EVERY DAY [name], MD Taking Active   benzonatate (TESSALON) 100 MG capsule [id number] Yes Take 1 capsule (100 mg total) by mouth 2 (two) times daily as needed for cough. [name], MD Taking Active   carvedilol (COREG) 12.5 MG  tablet [id number] Yes TAKE 1 TABLET BY MOUTH 2 TIMES DAILY [name], MD Taking Active   cetirizine (ZYRTEC) 10 MG tablet [id number] Yes Take 10 mg by mouth daily as needed for allergies or rhinitis. [provider] Taking Active Pharmacy Records, Child  Continuous Glucose Sensor (FREESTYLE LIBRE 14 DAY SENSOR) [place] [id number] Yes PLACE 1 DEVICE ON THE SKIN AS DIRECTED EVERY 14 DAYS Shamleffer, Konrad Dolores, MD Taking Active   diclofenac Sodium (VOLTAREN) 1 % GEL [id number] Yes Apply 2 g topically 4 (four) times daily.  Patient taking differently: Apply 2 g topically 4 (four) times daily as needed (pain).   [name], MD Taking Active Child, Pharmacy Records  docusate sodium (COLACE) 100 MG capsule [id number] No Take 1 capsule (100 mg total) by mouth 2 (two) times daily.  Patient not taking: Reported on 12/06/2022   Salvatore Decent, FNP Not Taking Active   DULoxetine (CYMBALTA) 30 MG capsule [id number] Yes TAKE 1 CAPSULE BY MOUTH EVERY DAY [name], MD Taking Active   Elastic Bandages & Supports (B & B CARPAL TUNNEL BRACE) MISC [id number] Yes Use brace on wrist as needed for wrist pain [name], MD Taking Active   febuxostat (ULORIC) 40 MG tablet [id number] Yes Take 1 tablet (40 mg total) by mouth daily. [name], MD Taking Active   fluticasone [facility]) 50 MCG/ACT nasal spray [id number] Yes Place 2 sprays into both nostrils daily for 14 days. Salvatore Decent, FNP Taking Active   folic acid (FOLVITE) 1 MG tablet [id number] Yes TAKE 1 TABLET BY MOUTH EVERY DAY [name], MD Taking Active   glucagon 1 MG injection [id number] No Inject 1  mg into the vein once as needed for up to 1 dose.  Patient not taking: Reported on 12/06/2022   [name], MD Not Taking Active   glucose 4 GM chewable tablet [id number] No Chew 1 tablet (4 g total) by mouth as needed for low blood sugar.  Patient not taking: Reported on 12/06/2022   [name], MD Not Taking  Active   guaifenesin [name]) 100 MG/5ML syrup [id number] No Take 200 mg by mouth 3 (three) times daily as needed for cough.  Patient not taking: Reported on 12/06/2022   [provider] Not Taking Active Pharmacy Records, Child  insulin glargine, 2 Unit Dial, (TOUJEO MAX SOLOSTAR) 300 UNIT/ML Solostar Pen [id number] Yes Inject 40 Units into the skin daily. Patient assistance program provides. Patient reports 40 units 04/24/2022  Patient taking differently: Inject 36 Units into the skin daily. Patient assistance program provides. Patient reports 40 units 04/24/2022   Shamleffer, Konrad Dolores, MD Taking Active   linaclotide [facility]) 145 MCG CAPS capsule [id number] Yes Take 1 capsule (145 mcg total) by mouth daily before breakfast. Worthy Rancher B, FNP Taking Active   meclizine (ANTIVERT) 25 MG tablet [id number] Yes Take 1-2 tablets (25-50 mg total) by mouth 2 (two) times daily as needed for dizziness. [name], MD Taking Active   memantine [facility]) 5 MG tablet [id number] Yes TAKE 2 TABLETS BY MOUTH EVERY MORNING and TAKE 1 TABLET BY MOUTH EVERY EVENING [name], MD Taking Active   montelukast (SINGULAIR) 10 MG tablet [id number] Yes Take 1 tablet (10 mg total) by mouth at bedtime. [name], MD Taking Active   nitroGLYCERIN (NITROSTAT) 0.4 MG SL tablet [id number] Yes Take one tablet under the tongue every 5 minutes as needed for chest pain Sharon Seller, NP Taking Active Pharmacy Records, Child           Med Note Maurice Small   Fri Oct 11, 2018  2:17 PM)    NOVOLOG FLEXPEN 100 UNIT/ML FlexPen [id number] Yes Inject 14 Units into the skin 2 (two) times daily with a meal. Max daily dose 70 units Shamleffer, Konrad Dolores, MD Taking Active   nystatin cream (MYCOSTATIN) [id number] Yes Apply 1 Application topically 2 (two) times daily. Kermit Balo, DO Taking Active Child, Pharmacy Records  ondansetron [facility]) 4 MG tablet [id number] Yes Take 1 tablet (4 mg  total) by mouth every 8 (eight) hours as needed for nausea or vomiting. [name], MD Taking Active Child, Pharmacy Records  oxyCODONE-acetaminophen [facility]) 7.5-325 MG tablet [id number] Yes Take 1 tablet by mouth every 12 (twelve) hours. Edward Jolly, MD Taking Active   pantoprazole (PROTONIX) 20 MG tablet [id number] Yes TAKE 1 TABLET BY MOUTH EVERY DAY [name], MD Taking Active   Semaglutide,0.25 or 0.5MG /DOS, (OZEMPIC, 0.25 OR 0.5 MG/DOSE,) 2 MG/3ML SOPN [id number] Yes Inject 0.5 mg into the skin once a week. Shamleffer, Konrad Dolores, MD Taking Active            Med Note Lisette Grinder   Tue Sep 12, 2022  1:15 PM) Started 1 wk ago  sodium polystyrene (SPS) 15 GM/60ML suspension [id number] Yes TAKE 60 MLS BY MOUTH ONCE WEEKLY TO KEEP POTASSIUM DOWN [name], MD Taking Active   SYMBICORT 160-4.5 MCG/ACT inhaler [id number] Yes Inhale 2 puffs by mouth twice daily Sharon Seller, NP Taking Active   torsemide (DEMADEX) 20 MG tablet [id number] Yes TAKE 1 TABLET BY MOUTH EVERY DAY TAKE AN EXTRA  DOES FOR WEIGHT GAIN, 3LB IN 1 DAY OR 5LBS IN 1 WEEK [name], MD Taking Active   Vitamin D, Ergocalciferol, (DRISDOL) 1.25 MG (50000 UNIT) CAPS capsule [id number] Yes TAKE 1 CAPSULE BY MOUTH ONCE WEEKLY Candise Che Corene Cornea, MD Taking Active   Med List Note Vernie Ammons, RN 06/15/22 1332): MR 10/01/22.            Home Care and Equipment/Supplies: Were Home Health Services Ordered?: Yes Name of Home Health Agency:: pt has services of [facility] and currently active with  home visits Has Agency set up a time to come to your home?: Yes First Home Health Visit Date:  (daughter reports home health has visited) Any new equipment or medical supplies ordered?: No  Functional Questionnaire: Do you need assistance with bathing/showering or dressing?: Yes (daughter assists) Do you need assistance with meal preparation?: Yes (daugher assists) Do you  need assistance with eating?: No Do you have difficulty maintaining continence: No Do you need assistance with getting out of bed/getting out of a chair/moving?: Yes (daughter assists) Do you have difficulty managing or taking your medications?: Yes (daughter assists)  Follow up appointments reviewed: PCP Follow-up appointment confirmed?: No (daughter states she will call and make appointment to see primary care provider, she is able to do this) MD Provider Line Number:[phone] Given: No Specialist Hospital Follow-up appointment confirmed?: No (pt states she does need to follow up with specialist) Reason Specialist Follow-Up Not Confirmed:  (pt is not going to follow up with specialist) Do you need transportation to your follow-up appointment?: No Do you understand care options if your condition(s) worsen?: Yes-patient verbalized understanding  SDOH Interventions Today    Flowsheet Row Most Recent Value  SDOH Interventions   Food Insecurity Interventions Intervention Not Indicated  Transportation Interventions Intervention Not Indicated  Utilities Interventions Intervention Not Indicated

## 2022-12-07 ENCOUNTER — Encounter: Payer: Self-pay | Admitting: Podiatry

## 2022-12-07 ENCOUNTER — Encounter: Payer: PPO | Admitting: Student in an Organized Health Care Education/Training Program

## 2022-12-07 ENCOUNTER — Ambulatory Visit (INDEPENDENT_AMBULATORY_CARE_PROVIDER_SITE_OTHER): Payer: PPO | Admitting: Podiatry

## 2022-12-07 DIAGNOSIS — B351 Tinea unguium: Secondary | ICD-10-CM

## 2022-12-07 DIAGNOSIS — E1169 Type 2 diabetes mellitus with other specified complication: Secondary | ICD-10-CM | POA: Diagnosis not present

## 2022-12-07 DIAGNOSIS — M21969 Unspecified acquired deformity of unspecified lower leg: Secondary | ICD-10-CM

## 2022-12-07 DIAGNOSIS — M79609 Pain in unspecified limb: Secondary | ICD-10-CM | POA: Diagnosis not present

## 2022-12-07 NOTE — Progress Notes (Signed)
This patient returns to my office for at risk foot care.  This patient requires this care by a professional since this patient will be at risk due to having diabetes and CKD. She presents to the office with her daughter in a wheelchair with her grandson. This patient is unable to cut nails herself since the patient cannot reach her nails.These nails are painful walking and wearing shoes.  This patient presents for at risk foot care today.  General Appearance  Alert, conversant and in no acute stress.  Vascular  Dorsalis pedis and posterior tibial  pulses are  weakly palpable  bilaterally.  Capillary return is within normal limits  bilaterally. Temperature is within normal limits  bilaterally.  Neurologic  Senn-Weinstein monofilament wire test within normal limits  bilaterally. Muscle power within normal limits bilaterally.  Nails Thick disfigured discolored nails with subungual debris  from hallux to fifth toes bilaterally. No evidence of bacterial infection or drainage bilaterally.  Orthopedic  No limitations of motion  feet .  No crepitus or effusions noted.  No bony pathology or digital deformities noted. Deviation of toes 2-5 right foot at the MPJ.  Skin  normotropic skin with no porokeratosis noted bilaterally.  No signs of infections or ulcers noted.     Onychomycosis  Pain in right toes  Pain in left toes  Consent was obtained for treatment procedures.   Mechanical debridement of nails 1-5  bilaterally performed with a nail nipper.  Filed with dremel without incident.    Return office visit  12  weeks                  Told patient to return for periodic foot care and evaluation due to potential at risk complications.    [name] DPM

## 2022-12-11 ENCOUNTER — Telehealth: Payer: Self-pay | Admitting: Family Medicine

## 2022-12-11 DIAGNOSIS — I130 Hypertensive heart and chronic kidney disease with heart failure and stage 1 through stage 4 chronic kidney disease, or unspecified chronic kidney disease: Secondary | ICD-10-CM | POA: Diagnosis not present

## 2022-12-11 DIAGNOSIS — Z9089 Acquired absence of other organs: Secondary | ICD-10-CM

## 2022-12-11 DIAGNOSIS — Z86718 Personal history of other venous thrombosis and embolism: Secondary | ICD-10-CM

## 2022-12-11 DIAGNOSIS — F0394 Unspecified dementia, unspecified severity, with anxiety: Secondary | ICD-10-CM | POA: Diagnosis not present

## 2022-12-11 DIAGNOSIS — M4726 Other spondylosis with radiculopathy, lumbar region: Secondary | ICD-10-CM

## 2022-12-11 DIAGNOSIS — K5903 Drug induced constipation: Secondary | ICD-10-CM | POA: Diagnosis not present

## 2022-12-11 DIAGNOSIS — D509 Iron deficiency anemia, unspecified: Secondary | ICD-10-CM

## 2022-12-11 DIAGNOSIS — Z9181 History of falling: Secondary | ICD-10-CM

## 2022-12-11 DIAGNOSIS — E559 Vitamin D deficiency, unspecified: Secondary | ICD-10-CM

## 2022-12-11 DIAGNOSIS — Z7984 Long term (current) use of oral hypoglycemic drugs: Secondary | ICD-10-CM

## 2022-12-11 DIAGNOSIS — R1310 Dysphagia, unspecified: Secondary | ICD-10-CM

## 2022-12-11 DIAGNOSIS — Z955 Presence of coronary angioplasty implant and graft: Secondary | ICD-10-CM

## 2022-12-11 DIAGNOSIS — G4733 Obstructive sleep apnea (adult) (pediatric): Secondary | ICD-10-CM

## 2022-12-11 DIAGNOSIS — Z90711 Acquired absence of uterus with remaining cervical stump: Secondary | ICD-10-CM

## 2022-12-11 DIAGNOSIS — F332 Major depressive disorder, recurrent severe without psychotic features: Secondary | ICD-10-CM

## 2022-12-11 DIAGNOSIS — Z7985 Long-term (current) use of injectable non-insulin antidiabetic drugs: Secondary | ICD-10-CM

## 2022-12-11 DIAGNOSIS — J309 Allergic rhinitis, unspecified: Secondary | ICD-10-CM

## 2022-12-11 DIAGNOSIS — Z556 Problems related to health literacy: Secondary | ICD-10-CM

## 2022-12-11 DIAGNOSIS — F0393 Unspecified dementia, unspecified severity, with mood disturbance: Secondary | ICD-10-CM | POA: Diagnosis not present

## 2022-12-11 DIAGNOSIS — Z87440 Personal history of urinary (tract) infections: Secondary | ICD-10-CM

## 2022-12-11 DIAGNOSIS — G8929 Other chronic pain: Secondary | ICD-10-CM

## 2022-12-11 DIAGNOSIS — Z7951 Long term (current) use of inhaled steroids: Secondary | ICD-10-CM

## 2022-12-11 DIAGNOSIS — E113393 Type 2 diabetes mellitus with moderate nonproliferative diabetic retinopathy without macular edema, bilateral: Secondary | ICD-10-CM | POA: Diagnosis not present

## 2022-12-11 DIAGNOSIS — J4489 Other specified chronic obstructive pulmonary disease: Secondary | ICD-10-CM

## 2022-12-11 DIAGNOSIS — E1122 Type 2 diabetes mellitus with diabetic chronic kidney disease: Secondary | ICD-10-CM | POA: Diagnosis not present

## 2022-12-11 DIAGNOSIS — M25552 Pain in left hip: Secondary | ICD-10-CM

## 2022-12-11 DIAGNOSIS — M48062 Spinal stenosis, lumbar region with neurogenic claudication: Secondary | ICD-10-CM

## 2022-12-11 DIAGNOSIS — I5032 Chronic diastolic (congestive) heart failure: Secondary | ICD-10-CM | POA: Diagnosis not present

## 2022-12-11 DIAGNOSIS — D631 Anemia in chronic kidney disease: Secondary | ICD-10-CM

## 2022-12-11 DIAGNOSIS — E1140 Type 2 diabetes mellitus with diabetic neuropathy, unspecified: Secondary | ICD-10-CM | POA: Diagnosis not present

## 2022-12-11 DIAGNOSIS — M170 Bilateral primary osteoarthritis of knee: Secondary | ICD-10-CM

## 2022-12-11 DIAGNOSIS — Z6841 Body Mass Index (BMI) 40.0 and over, adult: Secondary | ICD-10-CM

## 2022-12-11 DIAGNOSIS — H9193 Unspecified hearing loss, bilateral: Secondary | ICD-10-CM

## 2022-12-11 DIAGNOSIS — F411 Generalized anxiety disorder: Secondary | ICD-10-CM | POA: Diagnosis not present

## 2022-12-11 DIAGNOSIS — Z853 Personal history of malignant neoplasm of breast: Secondary | ICD-10-CM

## 2022-12-11 DIAGNOSIS — N184 Chronic kidney disease, stage 4 (severe): Secondary | ICD-10-CM | POA: Diagnosis not present

## 2022-12-11 DIAGNOSIS — G4700 Insomnia, unspecified: Secondary | ICD-10-CM

## 2022-12-11 DIAGNOSIS — E1169 Type 2 diabetes mellitus with other specified complication: Secondary | ICD-10-CM | POA: Diagnosis not present

## 2022-12-11 DIAGNOSIS — K219 Gastro-esophageal reflux disease without esophagitis: Secondary | ICD-10-CM

## 2022-12-11 DIAGNOSIS — E7849 Other hyperlipidemia: Secondary | ICD-10-CM | POA: Diagnosis not present

## 2022-12-11 DIAGNOSIS — Z794 Long term (current) use of insulin: Secondary | ICD-10-CM

## 2022-12-11 DIAGNOSIS — M79641 Pain in right hand: Secondary | ICD-10-CM

## 2022-12-11 DIAGNOSIS — K649 Unspecified hemorrhoids: Secondary | ICD-10-CM

## 2022-12-11 DIAGNOSIS — Z86711 Personal history of pulmonary embolism: Secondary | ICD-10-CM

## 2022-12-11 DIAGNOSIS — M1030 Gout due to renal impairment, unspecified site: Secondary | ICD-10-CM

## 2022-12-11 DIAGNOSIS — I252 Old myocardial infarction: Secondary | ICD-10-CM

## 2022-12-11 NOTE — Telephone Encounter (Signed)
   CLINICAL USE BELOW THIS LINE (use X to signify action taken)  _X__ Form received and placed in providers office for signature. ___ Form completed and faxed to LOA Dept.  ___ Form completed & LVM to notify patient ready for pick up.  ___ Charge sheet and copy of form in front office folder for office supervisor.  

## 2022-12-11 NOTE — Telephone Encounter (Signed)
Patient dropped off document Home Health Certificate (Order ID  ), to be filled out by provider. Patient requested to send it back via Fax within 5-days. Document is located in providers tray at front office.Please advise at Mobile [phone] (mobile)  Home health cert was by fax

## 2022-12-15 ENCOUNTER — Telehealth: Payer: Self-pay | Admitting: Family Medicine

## 2022-12-15 NOTE — Telephone Encounter (Signed)
Spoke with Mindi Junker and she verbalized that it's ok to take verbal orders from a covering provider. I advised her of your annotation below and she verbalized understanding.

## 2022-12-15 NOTE — Telephone Encounter (Signed)
HH ORDERS   Caller Name: [facility] Agency Name: [facility] Callback Phone #: [phone]  Service Requested: OT Frequency of Visits: 1xWx3  1xW every other week x3 For strengthening/mobility Functional transistions

## 2022-12-19 ENCOUNTER — Ambulatory Visit (INDEPENDENT_AMBULATORY_CARE_PROVIDER_SITE_OTHER): Payer: PPO | Admitting: Family Medicine

## 2022-12-19 VITALS — BP 118/88 | HR 79 | Temp 97.9°F | Wt 273.0 lb

## 2022-12-19 DIAGNOSIS — E113393 Type 2 diabetes mellitus with moderate nonproliferative diabetic retinopathy without macular edema, bilateral: Secondary | ICD-10-CM

## 2022-12-19 DIAGNOSIS — K5903 Drug induced constipation: Secondary | ICD-10-CM | POA: Diagnosis not present

## 2022-12-19 DIAGNOSIS — R6883 Chills (without fever): Secondary | ICD-10-CM

## 2022-12-19 MED ORDER — LINACLOTIDE 145 MCG PO CAPS
ORAL_CAPSULE | ORAL | 1 refills | Status: DC
Start: 2022-12-19 — End: 2023-10-07

## 2022-12-19 MED ORDER — DOCUSATE SODIUM 100 MG PO CAPS
200.0000 mg | ORAL_CAPSULE | Freq: Every day | ORAL | 3 refills | Status: DC
Start: 2022-12-19 — End: 2023-10-07

## 2022-12-19 NOTE — Assessment & Plan Note (Signed)
Chronic constipation, worsened by oxycodone.  Plan: Continue LINZESS (145 mcg daily). Continue docusate (200 mg daily). Use LINZESS for breakthrough constipation, especially with oxycodone.

## 2022-12-19 NOTE — Patient Instructions (Addendum)
For constipation, continue Take docusate 100 mg (2 tablets) daily, take LINZESS 145 microgram capsule daily and another capsule for breakthrough constipation, especially if using oxycodone. Follow up with pain management for ongoing chronic peripheral neuropathy related to diabetes. Monitor for signs of infection such as fever and chills, and follow-up if these symptoms worsen. Bring all current medications to the next appointment for review.

## 2022-12-19 NOTE — Assessment & Plan Note (Signed)
Chronic neuropathy causing significant pain. Plan: Follow up with pain management in 2 days. Continue with oxycodone as prescribed.

## 2022-12-19 NOTE — Assessment & Plan Note (Signed)
Possible infection due to chills, shaking, and mucus production  Recommended further workup including COVID testing and additional labs, however, patient declined further infectious workup  Counseled on potential worsening signs of infection and advised patient follow-up

## 2022-12-19 NOTE — Progress Notes (Signed)
Assessment/Plan:   Problem List Items Addressed This Visit       Digestive   Drug-induced constipation    Chronic constipation, worsened by oxycodone.  Plan: Continue LINZESS (145 mcg daily). Continue docusate (200 mg daily). Use LINZESS for breakthrough constipation, especially with oxycodone.      Relevant Medications   linaclotide (LINZESS) 145 MCG CAPS capsule   docusate sodium (COLACE) 100 MG capsule     Endocrine   Moderate nonproliferative diabetic retinopathy of both eyes without macular edema associated with type 2 diabetes mellitus (HCC)    Chronic neuropathy causing significant pain. Plan: Follow up with pain management in 2 days. Continue with oxycodone as prescribed.        Other   Chills - Primary    Possible infection due to chills, shaking, and mucus production  Recommended further workup including COVID testing and additional labs, however, patient declined further infectious workup  Counseled on potential worsening signs of infection and advised patient follow-up       Medications Discontinued During This Encounter  Medication Reason   docusate sodium (COLACE) 100 MG capsule Reorder   linaclotide (LINZESS) 145 MCG CAPS capsule    DULoxetine (CYMBALTA) 30 MG capsule     Return if symptoms worsen or fail to improve, for Constipation.    Subjective:   Encounter date: 12/19/2022  [name] is a 88 y.o. female who has Hyperlipidemia; Class 3 obesity (HCC); TENSION HEADACHE; Essential hypertension; Chronic diastolic CHF (congestive heart failure) (HCC); Long term current use of anticoagulant therapy; Insomnia; Estrogen deficiency; COPD with chronic bronchitis; Drug-induced constipation; Vertigo; Gout; Acute kidney injury superimposed on chronic kidney disease (HCC); Vitamin D deficiency; Endometrial polyp; GERD with esophagitis; Dementia (HCC); Type 2 diabetes mellitus with hyperlipidemia (HCC); Moderate nonproliferative diabetic retinopathy  of both eyes without macular edema associated with type 2 diabetes mellitus (HCC); Chronic radicular lumbar pain; Lumbar spondylosis; Spinal stenosis, lumbar region, with neurogenic claudication; Bilateral primary osteoarthritis of knee; Iron deficiency anemia; Chronic pain of right hand; Breast cancer (HCC); Severe episode of recurrent major depressive disorder, without psychotic features (HCC); Pneumobilia; Dermatosis papulosa nigra; CKD (chronic kidney disease), stage IV (HCC); Type 2 diabetes mellitus with stage 4 chronic kidney disease, with long-term current use of insulin (HCC); Type 2 diabetes mellitus with diabetic neuropathy, with long-term current use of insulin (HCC); GAD (generalized anxiety disorder); Allergic rhinitis; Diabetic peripheral neuropathy associated with type 2 diabetes mellitus (HCC); Swelling of right upper extremity; Carpal tunnel syndrome on both sides; Very limited skin mobility; Chronic left hip pain; Dependent for medication administration; and Chills on their problem list..   She  has a past medical history of Abdominal pain (01/26/2012), Acute deep vein thrombosis (DVT) of femoral vein of right lower extremity (HCC) (03/26/2022), Acute kidney injury superimposed on chronic kidney disease (HCC) (12/18/2014), Acute upper GI bleed (03/26/2022), Allergy, Anemia, unspecified, Anxiety, Anxiety associated with depression (06/08/2017), Arthritis, Breast cancer (HCC), Breast cancer screening (02/19/2014), Breast mass (10/27/2014), Chronic diastolic CHF (congestive heart failure) (HCC), Chronic pain syndrome (02/06/2015), CKD (chronic kidney disease), stage III (HCC), Coronary artery disease, Debility (08/01/2012), Dementia (HCC), Depression, Diabetes mellitus, DOE (dyspnea on exertion) (06/24/2014), Dysphagia, unspecified(787.20) (07/31/2012), Fungal dermatitis (11/13/2007), GERD (gastroesophageal reflux disease), Gout, unspecified, Headache, History of anemia due to chronic kidney  disease (03/26/2022), History of blood clots, History of deep venous thrombosis (01/27/2012), History of pulmonary embolus (PE) (04/22/2014), Hyperkalemia (07/26/2017), Hyperlipidemia, Hypertension, Insomnia, Joint pain, Joint swelling, Morbid obesity (HCC), Multiple benign nevi (11/15/2016), Myocardial  infarction [facility]) (2004), Non-STEMI (non-ST elevated myocardial infarction) (HCC) (02/15/2020), Numbness, Obstructive sleep apnea, Osteoarthritis, Osteoarthritis, Osteoarthrosis, unspecified whether generalized or localized, lower leg, Pain in the chest (12/31/2013), Pain, chronic, Personal history of other diseases of the digestive system (12/03/2006), Polymyalgia rheumatica (HCC), Presence of stent in right coronary artery, Pulmonary emboli (HCC) (01/2012), Situational depression (06/04/2009), Syncope, vasovagal (07/04/2021), Type 2 diabetes mellitus with hyperlipidemia (HCC) (02/16/2022), Urinary incontinence, Vaginitis and vulvovaginitis (06/23/2016), Vertigo, and Wears dentures..  Chief Complaint: Follow-up for anxiety, ongoing constipation, and new onset of chills.  History of Present Illness:  Anxiety. The patient reports ongoing anxiety and inability to sleep properly. She has been experiencing chronic stress and mentions that she is stressed and cold all the time.  Constipation. The patient has been struggling with constipation, which has been exacerbated by her pain medication (oxycodone). She uses LINZESS (145 mcg capsule daily) and DocuSate (stool softener) to alleviate the constipation, and these seem to be effective when used together. The patient reports that when she takes oxycodone, she does experience constipation but manages it with the aforementioned medications. Missing some days of medication because she doesn't always have someone to help her with bowel movements.  Chills and Possible Infection. Today, the patient presents with active chills and shivering. She has had chills on and  off but notes that the symptoms have worsened in the past 24 hours. She mentions that she has been cold all the time, but today it is more noticeable. She has no known fever, cough, or recent contact with sick individuals but has been experiencing mucus. Currently taking doxycycline for a suspected ear infection.  Neuropathy and Pain. The patient mentions significant pain in her feet, legs, and hands due to neuropathy, which prevents her from sleeping. She has been taking oxycodone for pain relief, which is effective but leads to constipation. She has an upcoming appointment with the pain management doctor.  Review of Systems  Constitutional:  Positive for chills. Negative for fever.  HENT:  Positive for congestion.   Respiratory:  Positive for shortness of breath. Negative for cough.   Cardiovascular:  Negative for chest pain.  Gastrointestinal:  Positive for abdominal pain (when straining for constipation) and constipation.  Neurological:  Positive for tingling and sensory change.  Psychiatric/Behavioral:  The patient is nervous/anxious and has insomnia.     Past Surgical History:  Procedure Laterality Date   ABDOMINAL HYSTERECTOMY     partial   APPENDECTOMY     blood clots/legs and lungs  2013   BREAST BIOPSY Left 07/22/2014   BREAST BIOPSY Left 02/10/2013   BREAST LUMPECTOMY Left 11/05/2014   BREAST LUMPECTOMY WITH RADIOACTIVE SEED LOCALIZATION Left 11/05/2014   Procedure: LEFT BREAST LUMPECTOMY WITH RADIOACTIVE SEED LOCALIZATION;  Surgeon: Abigail Miyamoto, MD;  Location: [facility] OR;  Service: General;  Laterality: Left;   CARDIAC CATHETERIZATION     COLONOSCOPY     CORONARY ANGIOPLASTY  2   CORONARY STENT INTERVENTION N/A 02/17/2020   Procedure: CORONARY STENT INTERVENTION;  Surgeon: Marykay Lex, MD;  [facility] INVASIVE CV LAB;  Service: Cardiovascular;  Laterality: N/A;   ESOPHAGOGASTRODUODENOSCOPY (EGD) WITH PROPOFOL N/A 11/07/2016   Procedure: ESOPHAGOGASTRODUODENOSCOPY (EGD)  WITH PROPOFOL;  Surgeon: Iva Boop, MD;  Location: [facility] ENDOSCOPY;  Service: Endoscopy;  Laterality: N/A;   EXCISION OF SKIN TAG Right 11/05/2014   Procedure: EXCISION OF RIGHT EYELID SKIN TAG;  Surgeon: Abigail Miyamoto, MD;  Location: [facility] OR;  Service: General;  Laterality: Right;   EYE SURGERY  Bilateral    cataract    GASTRIC BYPASS  1977    reversed in [facility], [facility]   LEFT HEART CATH AND CORONARY ANGIOGRAPHY N/A 02/17/2020   Procedure: LEFT HEART CATH AND CORONARY ANGIOGRAPHY;  Surgeon: Marykay Lex, MD;  Location: [facility] INVASIVE CV LAB;  Service: Cardiovascular;  Laterality: N/A;   LEFT HEART CATHETERIZATION WITH CORONARY ANGIOGRAM N/A 06/29/2014   Procedure: LEFT HEART CATHETERIZATION WITH CORONARY ANGIOGRAM;  Surgeon: Lennette Bihari, MD;  Location: [facility] CATH LAB;  Service: Cardiovascular;  Laterality: N/A;   MEMBRANE PEEL Right 10/23/2018   Procedure: MEMBRANE PEEL;  Surgeon: Edmon Crape, MD;  Location: [facility] OR;  Service: Ophthalmology;  Laterality: Right;   MI with stent placement  2004   PARS PLANA VITRECTOMY Right 10/23/2018   Procedure: PARS PLANA VITRECTOMY WITH 25 GAUGE;  Surgeon: Edmon Crape, MD;  Location: [facility] OR;  Service: Ophthalmology;  Laterality: Right;    Outpatient Medications Prior to Visit  Medication Sig Dispense Refill   albuterol (PROAIR HFA) 108 (90 Base) MCG/ACT inhaler Inhale 1-2 puffs into the lungs every 6 (six) hours as needed for wheezing or shortness of breath. 6.7 g 1   amoxicillin-clavulanate (AUGMENTIN) 875-125 MG tablet Take 1 tablet by mouth 2 (two) times daily.     anastrozole (ARIMIDEX) 1 MG tablet Take 1 tablet by mouth daily.     apixaban (ELIQUIS) 5 MG TABS tablet Take 1 tablet (5 mg total) by mouth 2 (two) times daily. 180 tablet 3   atorvastatin (LIPITOR) 80 MG tablet TAKE 1 TABLET BY MOUTH EVERY DAY 90 tablet 3   carvedilol (COREG) 12.5 MG tablet TAKE 1 TABLET BY MOUTH 2 TIMES DAILY 180 tablet 1   cetirizine (ZYRTEC) 10 MG tablet Take  10 mg by mouth daily as needed for allergies or rhinitis.     Continuous Glucose Sensor (FREESTYLE LIBRE 14 DAY SENSOR) MISC PLACE 1 DEVICE ON THE SKIN AS DIRECTED EVERY 14 DAYS 6 each 2   diclofenac Sodium (VOLTAREN) 1 % GEL Apply 2 g topically 4 (four) times daily. (Patient taking differently: Apply 2 g topically 4 (four) times daily as needed (pain).) 150 g 2   Elastic Bandages & Supports (B & B CARPAL TUNNEL BRACE) MISC Use brace on wrist as needed for wrist pain 2 each 10   febuxostat (ULORIC) 40 MG tablet Take 1 tablet (40 mg total) by mouth daily. 90 tablet 3   folic acid (FOLVITE) 1 MG tablet TAKE 1 TABLET BY MOUTH EVERY DAY 90 tablet 1   glucagon 1 MG injection Inject 1 mg into the vein once as needed for up to 1 dose. 1 each 12   glucose 4 GM chewable tablet Chew 1 tablet (4 g total) by mouth as needed for low blood sugar. 50 tablet 12   guaifenesin (ROBITUSSIN) 100 MG/5ML syrup Take 200 mg by mouth 3 (three) times daily as needed for cough.     insulin glargine, 2 Unit Dial, (TOUJEO MAX SOLOSTAR) 300 UNIT/ML Solostar Pen Inject 40 Units into the skin daily. Patient assistance program provides. Patient reports 40 units 04/24/2022 (Patient taking differently: Inject 36 Units into the skin daily. Patient assistance program provides. Patient reports 40 units 04/24/2022) 12 mL 1   meclizine (ANTIVERT) 25 MG tablet Take 1-2 tablets (25-50 mg total) by mouth 2 (two) times daily as needed for dizziness. 90 tablet 0   memantine (NAMENDA) 5 MG tablet TAKE 2 TABLETS BY MOUTH EVERY MORNING and TAKE  1 TABLET BY MOUTH EVERY EVENING 270 tablet 5   montelukast (SINGULAIR) 10 MG tablet Take 1 tablet (10 mg total) by mouth at bedtime. 30 tablet 3   nitroGLYCERIN (NITROSTAT) 0.4 MG SL tablet Take one tablet under the tongue every 5 minutes as needed for chest pain 50 tablet 0   NOVOLOG FLEXPEN 100 UNIT/ML FlexPen Inject 14 Units into the skin 2 (two) times daily with a meal. Max daily dose 70 units 60 mL 0    nystatin cream (MYCOSTATIN) Apply 1 Application topically 2 (two) times daily. 30 g 3   ondansetron (ZOFRAN) 4 MG tablet Take 1 tablet (4 mg total) by mouth every 8 (eight) hours as needed for nausea or vomiting. 20 tablet 0   pantoprazole (PROTONIX) 20 MG tablet TAKE 1 TABLET BY MOUTH EVERY DAY 30 tablet 5   Semaglutide,0.25 or 0.5MG /DOS, (OZEMPIC, 0.25 OR 0.5 MG/DOSE,) 2 MG/3ML SOPN Inject 0.5 mg into the skin once a week. 3 mL 11   sodium polystyrene (SPS) 15 GM/60ML suspension TAKE 60 MLS BY MOUTH ONCE WEEKLY TO KEEP POTASSIUM DOWN 240 mL 0   SYMBICORT 160-4.5 MCG/ACT inhaler Inhale 2 puffs by mouth twice daily 11 g 0   torsemide (DEMADEX) 20 MG tablet TAKE 1 TABLET BY MOUTH EVERY DAY TAKE AN EXTRA DOES FOR WEIGHT GAIN, 3LB IN 1 DAY OR 5LBS IN 1 WEEK 30 tablet 0   Vitamin D, Ergocalciferol, (DRISDOL) 1.25 MG (50000 UNIT) CAPS capsule TAKE 1 CAPSULE BY MOUTH ONCE WEEKLY 4 capsule 1   docusate sodium (COLACE) 100 MG capsule Take 1 capsule (100 mg total) by mouth 2 (two) times daily. 180 capsule 1   DULoxetine (CYMBALTA) 30 MG capsule TAKE 1 CAPSULE BY MOUTH EVERY DAY 90 capsule 0   linaclotide (LINZESS) 145 MCG CAPS capsule Take 1 capsule (145 mcg total) by mouth daily before breakfast. 30 capsule 2   benzonatate (TESSALON) 100 MG capsule Take 1 capsule (100 mg total) by mouth 2 (two) times daily as needed for cough. (Patient not taking: Reported on 12/19/2022) 20 capsule 0   doxycycline (VIBRA-TABS) 100 MG tablet Take by mouth.     fluticasone (FLONASE) 50 MCG/ACT nasal spray Place 2 sprays into both nostrils daily for 14 days. 1 g 0   traZODone (DESYREL) 50 MG tablet Take 25 mg by mouth at bedtime.     No facility-administered medications prior to visit.    Family History  Problem Relation Age of Onset   Breast cancer Mother 55   Heart disease Mother    Throat cancer Father    Hypertension Father    Arthritis Father    Diabetes Father    Arthritis Sister    Obesity Sister    Diabetes  Sister    Heart disease Cousin    Colon cancer Neg Hx    Stomach cancer Neg Hx    Esophageal cancer Neg Hx     Social History   Socioeconomic History   Marital status: Widowed    Spouse name: Not on file   Number of children: 3   Years of education: Not on file   Highest education level: High school graduate  Occupational History   Occupation: retired  Tobacco Use   Smoking status: Never    Passive exposure: Past   Smokeless tobacco: Never   Tobacco comments:    Both parents smoked, patient was exposed/ "Raised up in smoke, my whole life."  Vaping Use   Vaping status: Never Used  Substance and Sexual Activity   Alcohol use: No    Alcohol/week: 0.0 standard drinks of alcohol   Drug use: No   Sexual activity: Not on file  Other Topics Concern   Not on file  Social History Narrative   Not on file   Social Determinants of Health   Financial Resource Strain: Low Risk  (06/05/2022)   Overall Financial Resource Strain (CARDIA)    Difficulty of Paying Living Expenses: Not hard at all  Food Insecurity: No Food Insecurity (12/06/2022)   Hunger Vital Sign    Worried About Running Out of Food in the Last Year: Never true    Ran Out of Food in the Last Year: Never true  Transportation Needs: No Transportation Needs (12/06/2022)   PRAPARE - Administrator, Civil Service (Medical): No    Lack of Transportation (Non-Medical): No  Physical Activity: Insufficiently Active (06/05/2022)   Exercise Vital Sign    Days of Exercise per Week: 3 days    Minutes of Exercise per Session: 10 min  Stress: Stress Concern Present (06/05/2022)   [employer] of Occupational Health - Occupational Stress Questionnaire    Feeling of Stress : Rather much  Social Connections: Moderately Isolated (04/20/2022)   Social Connection and Isolation Panel [NHANES]    Frequency of Communication with Friends and Family: Once a week    Frequency of Social Gatherings with Friends and Family: Once  a week    Attends Religious Services: 1 to 4 times per year    Active Member of [employer] or Organizations: Yes    Attends Banker Meetings: 1 to 4 times per year    Marital Status: Widowed  Intimate Partner Violence: Not At Risk (04/20/2022)   Humiliation, Afraid, Rape, and Kick questionnaire    Fear of Current or Ex-Partner: No    Emotionally Abused: No    Physically Abused: No    Sexually Abused: No                                                                                                  Objective:  Physical Exam: BP 118/88 (BP Location: Left Arm, Patient Position: Sitting, Cuff Size: Large)   Pulse 79   Temp 97.9 F (36.6 C) (Temporal)   Wt 273 lb (123.8 kg)   BMI 42.76 kg/m     Physical Exam Constitutional:      Appearance: She is obese. She is ill-appearing (rigors, wrapped in blanket).  HENT:     Head: Normocephalic and atraumatic.     Right Ear: Hearing normal.     Left Ear: Hearing normal.     Nose: Nose normal.  Eyes:     General: No scleral icterus.       Right eye: No discharge.        Left eye: No discharge.     Extraocular Movements: Extraocular movements intact.  Cardiovascular:     Rate and Rhythm: Normal rate and regular rhythm.     Heart sounds: Normal heart sounds.     Comments: No cyanosis, no JVD Pulmonary:  Effort: Pulmonary effort is normal.     Breath sounds: Normal breath sounds. No decreased air movement.     Comments: No auditory wheezing Abdominal:     General: There is distension.     Tenderness: There is no abdominal tenderness.  Musculoskeletal:     Comments: Normal Ambulation. No clubbing  Skin:    General: Skin is warm.     Findings: No rash.  Neurological:     General: No focal deficit present.     Mental Status: She is alert.     Cranial Nerves: No cranial nerve deficit.     Motor: Weakness (Wheelchair-bound) present.  Psychiatric:        Attention and Perception: She is attentive.        Judgment:  Judgment is not impulsive.     XR HIP UNILAT W OR W/O PELVIS 2-3 VIEWS RIGHT  Result Date: 10/11/2022 X-rays demonstrate significant joint space narrowing of the right hip joint.  No acute abnormalities.   No results found for this or any previous visit (from the past 2160 hour(s)).      Garner Nash, MD, MS

## 2022-12-21 ENCOUNTER — Ambulatory Visit
Payer: PPO | Attending: Student in an Organized Health Care Education/Training Program | Admitting: Student in an Organized Health Care Education/Training Program

## 2022-12-21 ENCOUNTER — Encounter: Payer: Self-pay | Admitting: Student in an Organized Health Care Education/Training Program

## 2022-12-21 VITALS — BP 122/43 | HR 69 | Temp 98.0°F | Resp 16 | Ht 67.0 in | Wt 273.0 lb

## 2022-12-21 DIAGNOSIS — M25521 Pain in right elbow: Secondary | ICD-10-CM

## 2022-12-21 DIAGNOSIS — G8929 Other chronic pain: Secondary | ICD-10-CM

## 2022-12-21 DIAGNOSIS — M47816 Spondylosis without myelopathy or radiculopathy, lumbar region: Secondary | ICD-10-CM

## 2022-12-21 DIAGNOSIS — M48062 Spinal stenosis, lumbar region with neurogenic claudication: Secondary | ICD-10-CM

## 2022-12-21 DIAGNOSIS — G894 Chronic pain syndrome: Secondary | ICD-10-CM

## 2022-12-21 NOTE — Progress Notes (Signed)
No appt needed, pt has 2 rx's waiting at her pharmacy

## 2022-12-21 NOTE — Progress Notes (Signed)
Nursing Pain Medication Assessment:  Safety precautions to be maintained throughout the outpatient stay will include: orient to surroundings, keep bed in low position, maintain call bell within reach at all times, provide assistance with transfer out of bed and ambulation.  Medication Inspection Compliance: Ms. Schlein did not comply with our request to bring her pills to be counted. She was reminded that bringing the medication bottles, even when empty, is a requirement.  Medication: None brought in. Pill/Patch Count: None available to be counted. Bottle Appearance: No container available. Did not bring bottle(s) to appointment. Filled Date: N/A Last Medication intake:   4 days ago  [facility], there are two unfilled prescriptions for Oxycodone, dated 10-11-22 and 11-10-22.

## 2022-12-25 ENCOUNTER — Telehealth: Payer: Self-pay | Admitting: Family Medicine

## 2022-12-25 NOTE — Telephone Encounter (Signed)
Marissa from [facility] [phone] called and want to know if the patient is taking duloxetine

## 2022-12-25 NOTE — Telephone Encounter (Signed)
Attempted to reach [name].   Left a voicemail to call us back.

## 2022-12-26 ENCOUNTER — Telehealth: Payer: Self-pay | Admitting: *Deleted

## 2022-12-26 NOTE — Telephone Encounter (Signed)
Left [name] a voicemail advising her that per Dr. Carollee Massed note on 12/19/22 the duloxetine was discontinued and to call back with any concerns

## 2022-12-26 NOTE — Progress Notes (Signed)
  Care Coordination  Outreach Note  12/26/2022 Name: DACOTA FERRAND MRN: [id number] DOB: 1935/01/06   Care Coordination Outreach Attempts: An unsuccessful telephone outreach was attempted today to offer the patient information about available care coordination services.  Follow Up Plan:  Additional outreach attempts will be made to offer the patient care coordination information and services.   Encounter Outcome:  No Answer   Gwenevere Ghazi  Care Coordination Care Guide  Direct Dial: [phone]

## 2022-12-29 ENCOUNTER — Emergency Department (HOSPITAL_COMMUNITY): Payer: PPO

## 2022-12-29 ENCOUNTER — Other Ambulatory Visit: Payer: Self-pay

## 2022-12-29 ENCOUNTER — Encounter (HOSPITAL_COMMUNITY): Payer: Self-pay | Admitting: Emergency Medicine

## 2022-12-29 ENCOUNTER — Emergency Department (HOSPITAL_COMMUNITY)
Admission: EM | Admit: 2022-12-29 | Discharge: 2022-12-29 | Disposition: A | Discharge status: Home / Self Care | Payer: PPO | Attending: Emergency Medicine | Admitting: Emergency Medicine

## 2022-12-29 DIAGNOSIS — Z853 Personal history of malignant neoplasm of breast: Secondary | ICD-10-CM | POA: Diagnosis not present

## 2022-12-29 DIAGNOSIS — R918 Other nonspecific abnormal finding of lung field: Secondary | ICD-10-CM | POA: Diagnosis not present

## 2022-12-29 DIAGNOSIS — F0390 Unspecified dementia without behavioral disturbance: Secondary | ICD-10-CM | POA: Diagnosis not present

## 2022-12-29 DIAGNOSIS — I2510 Atherosclerotic heart disease of native coronary artery without angina pectoris: Secondary | ICD-10-CM | POA: Diagnosis not present

## 2022-12-29 DIAGNOSIS — D631 Anemia in chronic kidney disease: Secondary | ICD-10-CM | POA: Diagnosis not present

## 2022-12-29 DIAGNOSIS — N189 Chronic kidney disease, unspecified: Secondary | ICD-10-CM | POA: Insufficient documentation

## 2022-12-29 DIAGNOSIS — I10 Essential (primary) hypertension: Secondary | ICD-10-CM | POA: Diagnosis not present

## 2022-12-29 DIAGNOSIS — R079 Chest pain, unspecified: Secondary | ICD-10-CM | POA: Diagnosis not present

## 2022-12-29 DIAGNOSIS — I509 Heart failure, unspecified: Secondary | ICD-10-CM | POA: Insufficient documentation

## 2022-12-29 DIAGNOSIS — R0789 Other chest pain: Secondary | ICD-10-CM | POA: Diagnosis not present

## 2022-12-29 DIAGNOSIS — D649 Anemia, unspecified: Secondary | ICD-10-CM

## 2022-12-29 DIAGNOSIS — R0902 Hypoxemia: Secondary | ICD-10-CM | POA: Diagnosis not present

## 2022-12-29 DIAGNOSIS — Z7901 Long term (current) use of anticoagulants: Secondary | ICD-10-CM | POA: Insufficient documentation

## 2022-12-29 DIAGNOSIS — I129 Hypertensive chronic kidney disease with stage 1 through stage 4 chronic kidney disease, or unspecified chronic kidney disease: Secondary | ICD-10-CM | POA: Diagnosis not present

## 2022-12-29 DIAGNOSIS — Z1152 Encounter for screening for COVID-19: Secondary | ICD-10-CM | POA: Insufficient documentation

## 2022-12-29 DIAGNOSIS — Z794 Long term (current) use of insulin: Secondary | ICD-10-CM | POA: Insufficient documentation

## 2022-12-29 DIAGNOSIS — I959 Hypotension, unspecified: Secondary | ICD-10-CM | POA: Diagnosis not present

## 2022-12-29 LAB — URINALYSIS, ROUTINE W REFLEX MICROSCOPIC
Bilirubin Urine: NEGATIVE
Glucose, UA: NEGATIVE mg/dL
Hgb urine dipstick: NEGATIVE
Ketones, ur: NEGATIVE mg/dL
Nitrite: NEGATIVE
Protein, ur: NEGATIVE mg/dL
Specific Gravity, Urine: 1.009 (ref 1.005–1.030)
WBC, UA: >50 WBC/hpf (ref 0–5)
pH: 7 (ref 5.0–8.0)

## 2022-12-29 LAB — CBC
HCT: 25.3 % — ABNORMAL LOW (ref 36.0–46.0)
Hemoglobin: 7.6 g/dL — ABNORMAL LOW (ref 12.0–15.0)
MCH: 27.6 pg (ref 26.0–34.0)
MCHC: 30 g/dL (ref 30.0–36.0)
MCV: 92 fL (ref 80.0–100.0)
Platelets: 246 10*3/uL (ref 150–400)
RBC: 2.75 MIL/uL — ABNORMAL LOW (ref 3.87–5.11)
RDW: 14.6 % (ref 11.5–15.5)
WBC: 12.4 10*3/uL — ABNORMAL HIGH (ref 4.0–10.5)
nRBC: 0 % (ref 0.0–0.2)

## 2022-12-29 LAB — COMPREHENSIVE METABOLIC PANEL
ALT: 12 U/L (ref 0–44)
AST: 16 U/L (ref 15–41)
Albumin: 2.6 g/dL — ABNORMAL LOW (ref 3.5–5.0)
Alkaline Phosphatase: 82 U/L (ref 38–126)
Anion gap: 16 — ABNORMAL HIGH (ref 5–15)
BUN: 100 mg/dL — ABNORMAL HIGH (ref 8–23)
CO2: 21 mmol/L — ABNORMAL LOW (ref 22–32)
Calcium: 8.5 mg/dL — ABNORMAL LOW (ref 8.9–10.3)
Chloride: 100 mmol/L (ref 98–111)
Creatinine, Ser: 2.76 mg/dL — ABNORMAL HIGH (ref 0.44–1.00)
GFR, Estimated: 16 mL/min — ABNORMAL LOW (ref >60)
Glucose, Bld: 218 mg/dL — ABNORMAL HIGH (ref 70–99)
Potassium: 4.2 mmol/L (ref 3.5–5.1)
Sodium: 137 mmol/L (ref 135–145)
Total Bilirubin: 0.4 mg/dL (ref 0.3–1.2)
Total Protein: 6.9 g/dL (ref 6.5–8.1)

## 2022-12-29 LAB — SARS CORONAVIRUS 2 BY RT PCR: SARS Coronavirus 2 by RT PCR: NEGATIVE

## 2022-12-29 LAB — TROPONIN I (HIGH SENSITIVITY)
Troponin I (High Sensitivity): 13 ng/L (ref <18)
Troponin I (High Sensitivity): 11 ng/L (ref <18)

## 2022-12-29 LAB — POC OCCULT BLOOD, ED: Fecal Occult Bld: NEGATIVE

## 2022-12-29 LAB — D-DIMER, QUANTITATIVE: D-Dimer, Quant: 0.55 ug{FEU}/mL — ABNORMAL HIGH (ref 0.00–0.50)

## 2022-12-29 NOTE — ED Provider Notes (Signed)
Received patient in turnover from Dr. Particia Nearing.  Please see their note for further details of Hx, PE.  Briefly patient is a 88 y.o. female with a Chest Pain .  Chest pain atypical, found to be anemic below baseline but with no history of bleeding guaiac negative.  Mild AKI.  Chest x-ray with likely viral pneumonia.  Awaiting CT of the chest.  CT scan of the chest shows no pneumonia or other concerning finding.  Her second troponin is negative UA is negative for infection.  I discussed the results with the patient and family.  They would like to go home at this time.  Will have them follow-up with her family doctor in the office.    Melene Plan, DO 12/29/22 [phone]

## 2022-12-29 NOTE — Discharge Instructions (Signed)
Follow up with your doctor in the office.  Return for worsening symptoms especially if they occur while you are going up stairs or walking down the street.

## 2022-12-29 NOTE — ED Triage Notes (Signed)
Pt BIB [facility] from home due to chest pain.  Home health nurse reports she was having chest pain that radiated to abdomen with nausea.  Chest pain has been ongoing for a while per report. General weakness over a year.  VS BP 132/52, HR 72, resp 20, SpO2 99%, CBG 174.  324 aspirin given at home by home health RN

## 2022-12-29 NOTE — ED Notes (Signed)
Patient transported to CT 

## 2022-12-29 NOTE — ED Provider Notes (Signed)
[place] EMERGENCY DEPARTMENT AT [facility] Provider Note   CSN: [id number] Arrival date & time: 12/29/22  1228     History  Chief Complaint  Patient presents with   Chest Pain    [name] is a 88 y.o. female.  Pt is a 88 yo female with pmhx significant for hld, cad, hx PE (on eliquis), polymyalgia rheumatica, arthritis, anemia, depression, gerd, cad, chf, ckd, dementia, morbid obesity, chronic pain and hx breast cancer s/p left lumpectomy.  Pt's HH nurse called EMS today due to CP.  CP appears to be chronic.  She has no sx now.         Home Medications Prior to Admission medications   Medication Sig Start Date End Date Taking? Authorizing Provider  albuterol (PROAIR HFA) 108 (90 Base) MCG/ACT inhaler Inhale 1-2 puffs into the lungs every 6 (six) hours as needed for wheezing or shortness of breath. 12/19/18   Sharon Seller, NP  amoxicillin-clavulanate (AUGMENTIN) 875-125 MG tablet Take 1 tablet by mouth 2 (two) times daily. 10/16/22   [provider]  anastrozole (ARIMIDEX) 1 MG tablet Take 1 tablet by mouth daily. 07/31/22   [provider]  apixaban (ELIQUIS) 5 MG TABS tablet Take 1 tablet (5 mg total) by mouth 2 (two) times daily. 04/05/22 03/31/23  [name], MD  atorvastatin (LIPITOR) 80 MG tablet TAKE 1 TABLET BY MOUTH EVERY DAY 12/04/22   [name], MD  benzonatate (TESSALON) 100 MG capsule Take 1 capsule (100 mg total) by mouth 2 (two) times daily as needed for cough. 09/05/22   [name], MD  carvedilol (COREG) 12.5 MG tablet TAKE 1 TABLET BY MOUTH 2 TIMES DAILY 12/05/22   [name], MD  cetirizine (ZYRTEC) 10 MG tablet Take 10 mg by mouth daily as needed for allergies or rhinitis.    [provider]  Continuous Glucose Sensor (FREESTYLE LIBRE 14 DAY SENSOR) MISC PLACE 1 DEVICE ON THE SKIN AS DIRECTED EVERY 14 DAYS 11/27/22   Shamleffer, Konrad Dolores, MD  diclofenac Sodium (VOLTAREN) 1 % GEL  Apply 2 g topically 4 (four) times daily. Patient taking differently: Apply 2 g topically 4 (four) times daily as needed (pain). 02/23/22   [name], MD  docusate sodium (COLACE) 100 MG capsule Take 2 capsules (200 mg total) by mouth daily. 12/19/22 12/14/23  [name], MD  Elastic Bandages & Supports (B & B CARPAL TUNNEL BRACE) MISC Use brace on wrist as needed for wrist pain 04/24/22   [name], MD  febuxostat (ULORIC) 40 MG tablet Take 1 tablet (40 mg total) by mouth daily. 04/04/22 03/30/23  [name], MD  fluticasone (FLONASE) 50 MCG/ACT nasal spray Place 2 sprays into both nostrils daily for 14 days. 11/29/22 12/13/22  Salvatore Decent, FNP  folic acid (FOLVITE) 1 MG tablet TAKE 1 TABLET BY MOUTH EVERY DAY 12/05/22   [name], MD  glucagon 1 MG injection Inject 1 mg into the vein once as needed for up to 1 dose. 04/13/22   [name], MD  glucose 4 GM chewable tablet Chew 1 tablet (4 g total) by mouth as needed for low blood sugar. 04/05/22   [name], MD  guaifenesin (ROBITUSSIN) 100 MG/5ML syrup Take 200 mg by mouth 3 (three) times daily as needed for cough.    [provider]  insulin glargine, 2 Unit Dial, (TOUJEO MAX SOLOSTAR) 300 UNIT/ML Solostar Pen Inject  40 Units into the skin daily. Patient assistance program provides. Patient reports 40 units 04/24/2022 Patient taking differently: Inject 36 Units into the skin daily. Patient assistance program provides. Patient reports 40 units 04/24/2022 05/31/22   Shamleffer, Konrad Dolores, MD  linaclotide [facility]) 145 MCG CAPS capsule Patient take 1 capsule daily.  May add an additional capsule for breakthrough constipation associated with opioid use 12/19/22   [name], MD  meclizine (ANTIVERT) 25 MG tablet Take 1-2 tablets (25-50 mg total) by mouth 2 (two) times daily as needed for dizziness. 06/13/22   [name], MD  memantine (NAMENDA) 5 MG tablet TAKE 2 TABLETS BY MOUTH  EVERY MORNING and TAKE 1 TABLET BY MOUTH EVERY EVENING 10/10/22   [name], MD  montelukast (SINGULAIR) 10 MG tablet Take 1 tablet (10 mg total) by mouth at bedtime. 04/24/22   [name], MD  nitroGLYCERIN (NITROSTAT) 0.4 MG SL tablet Take one tablet under the tongue every 5 minutes as needed for chest pain 09/24/13   Sharon Seller, NP  NOVOLOG FLEXPEN 100 UNIT/ML FlexPen Inject 14 Units into the skin 2 (two) times daily with a meal. Max daily dose 70 units 04/03/22   Shamleffer, Konrad Dolores, MD  nystatin cream (MYCOSTATIN) Apply 1 Application topically 2 (two) times daily. 02/16/22   [name], Tiffany L, DO  ondansetron (ZOFRAN) 4 MG tablet Take 1 tablet (4 mg total) by mouth every 8 (eight) hours as needed for nausea or vomiting. 03/14/22   [name], MD  pantoprazole (PROTONIX) 20 MG tablet TAKE 1 TABLET BY MOUTH EVERY DAY 10/23/22   [name], MD  Semaglutide,0.25 or 0.5MG /DOS, (OZEMPIC, 0.25 OR 0.5 MG/DOSE,) 2 MG/3ML SOPN Inject 0.5 mg into the skin once a week. 08/21/22   Shamleffer, Konrad Dolores, MD  sodium polystyrene (SPS) 15 GM/60ML suspension TAKE 60 MLS BY MOUTH ONCE WEEKLY TO KEEP POTASSIUM DOWN 07/21/22   [name], MD  SYMBICORT 160-4.5 MCG/ACT inhaler Inhale 2 puffs by mouth twice daily 05/12/22   Sharon Seller, NP  torsemide (DEMADEX) 20 MG tablet TAKE 1 TABLET BY MOUTH EVERY DAY TAKE AN EXTRA DOES FOR WEIGHT GAIN, 3LB IN 1 DAY OR 5LBS IN 1 WEEK 12/05/22   [name], MD  traZODone (DESYREL) 50 MG tablet Take 25 mg by mouth at bedtime.    [provider]  Vitamin D, Ergocalciferol, (DRISDOL) 1.25 MG (50000 UNIT) CAPS capsule TAKE 1 CAPSULE BY MOUTH ONCE WEEKLY 11/02/22   Nichole Maine, MD      Allergies    Sulfonamide derivatives, Duloxetine, Lokelma [sodium zirconium cyclosilicate], Aricept [donepezil hcl], and Tramadol    Review of Systems   Review of Systems  Respiratory:  Positive for shortness of breath.    Cardiovascular:  Positive for chest pain.  All other systems reviewed and are negative.   Physical Exam Updated Vital Signs BP (!) 139/123   Pulse 70   Temp 97.7 F (36.5 C) (Oral)   Resp 20   Ht 5\' 7"  (1.702 m)   Wt 123.4 kg   SpO2 100%   BMI 42.60 kg/m  Physical Exam Vitals and nursing note reviewed. Exam conducted with a chaperone present.  Constitutional:      Appearance: She is well-developed. She is obese.  HENT:     Head: Normocephalic and atraumatic.  Eyes:     Extraocular Movements: Extraocular movements intact.     Pupils: Pupils are equal, round, and reactive to  light.  Cardiovascular:     Rate and Rhythm: Normal rate and regular rhythm.     Heart sounds: Normal heart sounds.  Pulmonary:     Effort: Pulmonary effort is normal.     Breath sounds: Normal breath sounds.  Abdominal:     General: Bowel sounds are normal.     Palpations: Abdomen is soft.  Genitourinary:    Rectum: Guaiac result negative.  Musculoskeletal:        General: Normal range of motion.     Cervical back: Normal range of motion and neck supple.  Skin:    General: Skin is warm.     Capillary Refill: Capillary refill takes less than 2 seconds.  Neurological:     General: No focal deficit present.     Mental Status: She is alert and oriented to person, place, and time.  Psychiatric:        Mood and Affect: Mood normal.        Behavior: Behavior normal.     ED Results / Procedures / Treatments   Labs (all labs ordered are listed, but only abnormal results are displayed) Labs Reviewed  CBC - Abnormal; Notable for the following components:      Result Value   WBC 12.4 (*)    RBC 2.75 (*)    Hemoglobin 7.6 (*)    HCT 25.3 (*)    All other components within normal limits  COMPREHENSIVE METABOLIC PANEL - Abnormal; Notable for the following components:   CO2 21 (*)    Glucose, Bld 218 (*)    BUN 100 (*)    Creatinine, Ser 2.76 (*)    Calcium 8.5 (*)    Albumin 2.6 (*)    GFR,  Estimated 16 (*)    Anion gap 16 (*)    All other components within normal limits  D-DIMER, QUANTITATIVE - Abnormal; Notable for the following components:   D-Dimer, Quant 0.55 (*)    All other components within normal limits  SARS CORONAVIRUS 2 BY RT PCR  URINALYSIS, ROUTINE W REFLEX MICROSCOPIC  POC OCCULT BLOOD, ED  TROPONIN I (HIGH SENSITIVITY)  TROPONIN I (HIGH SENSITIVITY)    EKG EKG Interpretation Date/Time:  Friday December 29 2022 12:38:32 EDT Ventricular Rate:  73 PR Interval:  218 QRS Duration:  110 QT Interval:  431 QTC Calculation: 475 R Axis:   28  Text Interpretation: Sinus rhythm Borderline prolonged PR interval Abnormal R-wave progression, early transition No significant change since last tracing Confirmed by Jacalyn Lefevre [phone]) on 12/29/2022 12:52:49 PM  Radiology DG Chest Port 1 View  Result Date: 12/29/2022 CLINICAL DATA:  Chest pain. EXAM: PORTABLE CHEST 1 VIEW COMPARISON:  Chest x-ray dated July 12, 2022. FINDINGS: The heart size and mediastinal contours are within normal limits. Mildly increased ill-defined interstitial opacities, worst at the lung bases. No focal consolidation, pleural effusion, or pneumothorax. No acute osseous abnormality. IMPRESSION: 1. Increased ill-defined interstitial opacities, worst at the lung bases, concerning for atypical infection or mild interstitial pulmonary edema. Electronically Signed   By: Obie Dredge M.D.   On: 12/29/2022 13:44    Procedures Procedures    Medications Ordered in ED Medications - No data to display  ED Course/ Medical Decision Making/ A&P                                 Medical Decision Making Amount and/or Complexity of Data Reviewed Labs:  ordered. Radiology: ordered.   This patient presents to the ED for concern of cp, this involves an extensive number of treatment options, and is a complaint that carries with it a high risk of complications and morbidity.  The differential diagnosis  includes cardiac, pulm, gi, msk   Co morbidities that complicate the patient evaluation  hld, cad, hx PE (on eliquis), polymyalgia rheumatica, arthritis, anemia, depression, gerd, cad, chf, ckd, dementia, morbid obesity, chronic pain and hx breast cancer s/p left lumpectomy   Additional history obtained:  Additional history obtained from epic chart review External records from outside source obtained and reviewed including EMS report   Lab Tests:  I Ordered, and personally interpreted labs.  The pertinent results include:  cbc with hgb 7.6 (hgb usually runs in the upper 8 range), cmp with bun 100 and cr 2.76 986 and 2.34 in May, ddimer 0.55 (stable), trop nl at 13   Imaging Studies ordered:  I ordered imaging studies including cxr and ct chest I independently visualized and interpreted imaging which showed  CXR:  Increased ill-defined interstitial opacities, worst at the lung  bases, concerning for atypical infection or mild interstitial  pulmonary edema.  CT chest pending at shift change I agree with the radiologist interpretation   Cardiac Monitoring:  The patient was maintained on a cardiac monitor.  I personally viewed and interpreted the cardiac monitored which showed an underlying rhythm of: nsr   Medicines ordered and prescription drug management:  I have reviewed the patients home medicines and have made adjustments as needed   Test Considered:  ct    Problem List / ED Course:  Cp:  atypical.  EKG and trop ok Anemia:  worse than normal.  Pt is guaiac neg.  Pt will need outpatient anemia w/u if she goes home. Abn cxr:  ct and covid pending   Reevaluation:  After the interventions noted above, I reevaluated the patient and found that they have :improved   Social Determinants of Health:  Lives at home   Dispostion:  After consideration of the diagnostic results and the patients response to treatment, I feel that the patent would benefit from  pending at shift change.          Final Clinical Impression(s) / ED Diagnoses Final diagnoses:  Anemia, unspecified type  Atypical chest pain    Rx / DC Orders ED Discharge Orders     None         Jacalyn Lefevre, MD 12/29/22 1554

## 2023-01-01 DIAGNOSIS — N189 Chronic kidney disease, unspecified: Secondary | ICD-10-CM | POA: Diagnosis not present

## 2023-01-01 DIAGNOSIS — N183 Chronic kidney disease, stage 3 unspecified: Secondary | ICD-10-CM | POA: Diagnosis not present

## 2023-01-01 DIAGNOSIS — I509 Heart failure, unspecified: Secondary | ICD-10-CM | POA: Diagnosis not present

## 2023-01-01 DIAGNOSIS — N2581 Secondary hyperparathyroidism of renal origin: Secondary | ICD-10-CM | POA: Diagnosis not present

## 2023-01-01 DIAGNOSIS — E1122 Type 2 diabetes mellitus with diabetic chronic kidney disease: Secondary | ICD-10-CM | POA: Diagnosis not present

## 2023-01-01 DIAGNOSIS — D631 Anemia in chronic kidney disease: Secondary | ICD-10-CM | POA: Diagnosis not present

## 2023-01-01 DIAGNOSIS — I129 Hypertensive chronic kidney disease with stage 1 through stage 4 chronic kidney disease, or unspecified chronic kidney disease: Secondary | ICD-10-CM | POA: Diagnosis not present

## 2023-01-02 ENCOUNTER — Other Ambulatory Visit: Payer: Self-pay

## 2023-01-02 ENCOUNTER — Other Ambulatory Visit: Payer: Self-pay | Admitting: Family Medicine

## 2023-01-02 ENCOUNTER — Other Ambulatory Visit: Payer: PPO | Admitting: *Deleted

## 2023-01-02 ENCOUNTER — Telehealth: Payer: Self-pay | Admitting: Hematology

## 2023-01-02 ENCOUNTER — Encounter: Payer: Self-pay | Admitting: *Deleted

## 2023-01-02 ENCOUNTER — Other Ambulatory Visit: Payer: Self-pay | Admitting: Hematology

## 2023-01-02 DIAGNOSIS — Z170 Estrogen receptor positive status [ER+]: Secondary | ICD-10-CM

## 2023-01-02 DIAGNOSIS — D509 Iron deficiency anemia, unspecified: Secondary | ICD-10-CM

## 2023-01-02 NOTE — Progress Notes (Signed)
  Care Coordination   Note   01/02/2023 Name: [name] MRN: [id number] DOB: [birth year]  [name] is a 88 y.o. year old female who sees [name], MD for primary care. Irving Shows reached out to [name] by phone today to offer care coordination services.  [name] was given information about Care Coordination services today including:   The Care Coordination services include support from the care team which includes your Nurse Coordinator, Clinical Social Worker, or Pharmacist.  The Care Coordination team is here to help remove barriers to the health concerns and goals most important to you. Care Coordination services are voluntary, and the patient may decline or stop services at any time by request to their care team member.   Care Coordination Consent Status: Patient did not agree to participate in care coordination services at this time.   Irving Shows RNCM routed staff message to care guide patient is no longer interested in receiving calls to schedule with Licensed Clinical Social Worker.   Encounter Outcome:  Pt. Refused  [facility] Coordination Care Guide  Direct Dial: [phone]

## 2023-01-02 NOTE — Patient Outreach (Signed)
Care Management   Visit Note  01/02/2023 Name: AREYONA SCHALLERT MRN: [id number] DOB: [birth year]  Subjective: [name] is a 88 y.o. year old female who is a primary care patient of [name], MD. The Care Management team was consulted for assistance.      Engaged with patient by telephone for follow up.   Goals Addressed             This Visit's Progress    CCM (DIABETES) EXPECTED OUTCOME:  MONITOR, SELF-MANAGE AND REDUCE SYMPTOMS OF DIABETES       Current Barriers:  Knowledge Deficits related to Diabetes management Chronic Disease Management support and education needs related to Diabetes and diet Cognitive Deficits Per patient's daughter [name], CBG is monitored with [employer], states fasting ranges have been in low 100's with no hypoglycemic episodes. Pt has glucose tablets on hand, is trying to eat 3 meals per day plus snack at bedtime.  Daughter reports pt is taking Trazodone 50 mg at hs and this is helping with sleep Patient went to ED on 12/29/22 for chest pain/ anemia, has had no further chest pain and is to follow up with oncologist in September for iron infusion  Planned Interventions: Reviewed medications with patient and discussed importance of medication adherence;        Counseled on importance of regular laboratory monitoring as prescribed;        Advised patient, providing education and rationale, to check cbg per C[facility] [employer] [place]  and record        call provider for findings outside established parameters;       Review of patient status, including review of consultants reports, relevant laboratory and other test results, and medications completed;       Advised patient to discuss any issues with blood sugar, hypoglycemia with provider;      Reinforced importance of eating 3 meals per day Reinforced carbohydrate modified diet Reviewed all upcoming scheduled appointments including oncology Transition of care completed   Symptom  Management: Take medications as prescribed   Attend all scheduled provider appointments Call pharmacy for medication refills 3-7 days in advance of running out of medications Attend church or other social activities Perform all self care activities independently  Perform IADL's (shopping, preparing meals, housekeeping, managing finances) independently Call provider office for new concerns or questions  check blood sugar at prescribed times: [employer]  check feet daily for cuts, sores or redness enter blood sugar readings and medication or insulin into daily log take the blood sugar log to all doctor visits take the blood sugar meter to all doctor visits trim toenails straight across eat fish at least once per week fill half of plate with vegetables limit fast food meals to no more than 1 per week manage portion size prepare main meal at home 3 to 5 days each week read food labels for fat, fiber, carbohydrates and portion size keep feet up while sitting wash and dry feet carefully every day Try eating a snack at hs with protein and carbohydrate Please stay in contact with your doctor if you have any issues with blood sugar, hypoglycemia (low blood sugar) Follow RULE OF 15 for low blood sugar management:  How to treat low blood sugars (Blood sugar less than 70 mg/dl  Please follow the RULE OF 15 for the treatment of hypoglycemia treatment (When your blood sugars are less than 70 mg/ dl) STEP  1:  Take 15 grams of carbohydrates when  your blood sugar is low, which includes:   3-4 glucose tabs or  3-4 oz of juice or regular soda or  One tube of glucose gel STEP 2:  Recheck blood sugar in 15 minutes STEP 3:  If your blood sugar is still low at the 15 minute recheck ---then, go back to STEP 1 and treat again with another 15 grams of carbohydrates  Follow Up Plan: Telephone follow up appointment with care management team member scheduled for:   03/19/23 at 1015 am       CCM  (HYPERTENSION) EXPECTED OUTCOME: MONITOR, SELF-MANAGE AND REDUCE SYMPTOMS OF HYPERTENSION       Current Barriers:  Knowledge Deficits related to Hypertension management Chronic Disease Management support and education needs related to Hypertension Cognitive Deficits-dementia Spoke with patient's daughter [name] who lives with patient, reports patient has "mild dementia", states pt needs assistance with all ADL's, IADL's, pt has WC, walker, shower seat and stays in bed a lot, had home health PT for strengthening, states pt is trying to do chair and bed exercises prescribed by physical therapist.  Elita Quick is primary caregiver,  pt has another daughter that provides meals, pt refuses PACE program.  [name] denies any needs for social work at present, no new concerns reported, states " found out recently she doesn't qualify for medicaid" , daughter reports she has contact information for 2 companies she will be reaching out to for in home assistance Blood pressure is not monitored at home.  Planned Interventions: Evaluation of current treatment plan related to hypertension self management and patient's adherence to plan as established by provider;   Reviewed medications with patient and discussed importance of compliance;  Advised patient, providing education and rationale, to monitor blood pressure daily and record, calling PCP for findings outside established parameters;  Advised patient to discuss any issues with blood pressure or medications with provider; Reinforced importance of adherence to low sodium diet and reading food labels Reviewed importance of changing positions q 2 hours In basket message sent to Gwenevere Ghazi care guide to provide no further outreaches to schedule social worker, daughter declined  Symptom Management: Take medications as prescribed   Attend all scheduled provider appointments Call pharmacy for medication refills 3-7 days in advance of running out of medications Attend  church or other social activities Perform all self care activities independently  Perform IADL's (shopping, preparing meals, housekeeping, managing finances) independently Call provider office for new concerns or questions  check blood pressure weekly choose a place to take my blood pressure (home, clinic or office, retail store) write blood pressure results in a log or diary learn about high blood pressure keep a blood pressure log take blood pressure log to all doctor appointments call doctor for signs and symptoms of high blood pressure keep all doctor appointments take medications for blood pressure exactly as prescribed report new symptoms to your doctor eat more whole grains, fruits and vegetables, lean meats and healthy fats Follow low sodium diet- read labels for sodium content, limit/ avoid fast food Continue completing chair/ bed exercises prescribed by physical therapy Change positions every 2 hours  Follow Up Plan: Telephone follow up appointment with care management team member scheduled for:  03/19/23 at 1015 am           Plan: Telephone follow up appointment with care management team member scheduled for: 03/19/23 at 1015 am  Irving Shows [facility], BSN [place]/ Ambulatory Care Management [phone]

## 2023-01-02 NOTE — Transitions of Care (Post Inpatient/ED Visit) (Signed)
01/02/2023  Name: [name] MRN: [id number] DOB: 10-Jun-1934  Today's TOC FU Call Status: Today's TOC FU Call Status:: Successful TOC FU Call Completed TOC FU Call Complete Date: 01/02/23 Patient's Name and Date of Birth confirmed.  Transition Care Management Follow-up Telephone Call Date of Discharge: 12/29/22 Discharge Facility: [facility]) Type of Discharge: Emergency Department Reason for ED Visit: Other: (anemia) How have you been since you were released from the hospital?: Better Any questions or concerns?: No  Items Reviewed: Did you receive and understand the discharge instructions provided?: Yes Medications obtained,verified, and reconciled?: Yes (Medications Reviewed) Any new allergies since your discharge?: No Dietary orders reviewed?: Yes Type of Diet Ordered:: carbohydrate modified, heart healthy Do you have support at home?: Yes People in Home: child(ren), adult Name of Support/Comfort Primary Source: daughter [name] lives wtih pt  Medications Reviewed Today: Medications Reviewed Today     Reviewed by Audrie Gallus, RN (Registered Nurse) on 01/02/23 at 1049  Med List Status: <None>   Medication Order Taking? Sig Documenting Provider Last Dose Status Informant  albuterol (PROAIR HFA) 108 (90 Base) MCG/ACT inhaler [id number] Yes Inhale 1-2 puffs into the lungs every 6 (six) hours as needed for wheezing or shortness of breath. Sharon Seller, NP Taking Active Pharmacy Records, Child  amoxicillin-clavulanate (AUGMENTIN) 875-125 MG tablet [id number] No Take 1 tablet by mouth 2 (two) times daily.  Patient not taking: Reported on 01/02/2023   [provider] Not Taking Active   anastrozole (ARIMIDEX) 1 MG tablet [id number] Yes Take 1 tablet by mouth daily. [provider] Taking Active   apixaban (ELIQUIS) 5 MG TABS tablet [id number] Yes Take 1 tablet (5 mg total) by mouth 2 (two) times daily. [name], MD Taking Active    atorvastatin (LIPITOR) 80 MG tablet [id number] Yes TAKE 1 TABLET BY MOUTH EVERY DAY [name], MD Taking Active   benzonatate (TESSALON) 100 MG capsule [id number] No Take 1 capsule (100 mg total) by mouth 2 (two) times daily as needed for cough.  Patient not taking: Reported on 01/02/2023   [name], MD Not Taking Active   carvedilol (COREG) 12.5 MG tablet [id number] Yes TAKE 1 TABLET BY MOUTH 2 TIMES DAILY [name], MD Taking Active   cetirizine (ZYRTEC) 10 MG tablet [id number] Yes Take 10 mg by mouth daily as needed for allergies or rhinitis. [provider] Taking Active Pharmacy Records, Child  Continuous Glucose Sensor (FREESTYLE LIBRE 14 DAY SENSOR) [place] [id number] Yes PLACE 1 DEVICE ON THE SKIN AS DIRECTED EVERY 14 DAYS Shamleffer, Konrad Dolores, MD Taking Active   diclofenac Sodium (VOLTAREN) 1 % GEL [id number] Yes Apply 2 g topically 4 (four) times daily.  Patient taking differently: Apply 2 g topically 4 (four) times daily as needed (pain).   [name], MD Taking Active Child, Pharmacy Records  docusate sodium (COLACE) 100 MG capsule [id number] Yes Take 2 capsules (200 mg total) by mouth daily. [name], MD Taking Active   Elastic Bandages & Supports ([employer] [place]) MISC [id number] Yes Use brace on wrist as needed for wrist pain [name], MD Taking Active   febuxostat (ULORIC) 40 MG tablet [id number] Yes Take 1 tablet (40 mg total) by mouth daily. [name], MD Taking Active   fluticasone [facility]) 50 MCG/ACT nasal spray [id number]  Place 2 sprays into both nostrils daily for 14 days. Salvatore Decent, FNP  Expired 12/13/22 [phone]  folic acid (FOLVITE) 1 MG tablet [id number] Yes TAKE 1 TABLET BY MOUTH EVERY DAY [name], MD Taking Active   glucagon 1 MG injection [id number] Yes Inject 1 mg into the vein once as needed for up to 1 dose. [name], MD Taking Active   glucose 4 GM chewable tablet [id number] Yes  Chew 1 tablet (4 g total) by mouth as needed for low blood sugar. [name], MD Taking Active   guaifenesin [facility]) 100 MG/5ML syrup [id number] No Take 200 mg by mouth 3 (three) times daily as needed for cough.  Patient not taking: Reported on 01/02/2023   [provider] Not Taking Active Pharmacy Records, Child  insulin glargine, 2 Unit Dial, (TOUJEO MAX SOLOSTAR) 300 UNIT/ML Solostar Pen [id number] Yes Inject 40 Units into the skin daily. Patient assistance program provides. Patient reports 40 units 04/24/2022  Patient taking differently: Inject 36 Units into the skin daily. Patient assistance program provides. Patient reports 40 units 04/24/2022   Shamleffer, Konrad Dolores, MD Taking Active   linaclotide [facility]) 145 MCG CAPS capsule [id number] Yes Patient take 1 capsule daily.  May add an additional capsule for breakthrough constipation associated with opioid use [name], MD Taking Active   meclizine (ANTIVERT) 25 MG tablet [id number] Yes Take 1-2 tablets (25-50 mg total) by mouth 2 (two) times daily as needed for dizziness. [name], MD Taking Active   memantine [facility]) 5 MG tablet [id number] Yes TAKE 2 TABLETS BY MOUTH EVERY MORNING and TAKE 1 TABLET BY MOUTH EVERY EVENING [name], MD Taking Active   montelukast (SINGULAIR) 10 MG tablet [id number] Yes Take 1 tablet (10 mg total) by mouth at bedtime. [name], MD Taking Active   nitroGLYCERIN (NITROSTAT) 0.4 MG SL tablet [id number] Yes Take one tablet under the tongue every 5 minutes as needed for chest pain Sharon Seller, NP Taking Active Pharmacy Records, Child           Med Note Maurice Small   Fri Oct 11, 2018  2:17 PM)    NOVOLOG FLEXPEN 100 UNIT/ML FlexPen [id number] Yes Inject 14 Units into the skin 2 (two) times daily with a meal. Max daily dose 70 units Shamleffer, Konrad Dolores, MD Taking Active   nystatin cream (MYCOSTATIN) [id number] Yes Apply 1 Application topically  2 (two) times daily. Kermit Balo, DO Taking Active Child, Pharmacy Records  ondansetron [facility]) 4 MG tablet [id number] Yes Take 1 tablet (4 mg total) by mouth every 8 (eight) hours as needed for nausea or vomiting. [name], MD Taking Active Child, Pharmacy Records  pantoprazole (PROTONIX) 20 MG tablet [id number] Yes TAKE 1 TABLET BY MOUTH EVERY DAY [name], MD Taking Active   Semaglutide,0.25 or 0.5MG /DOS, (OZEMPIC, 0.25 OR 0.5 MG/DOSE,) 2 MG/3ML SOPN [id number] Yes Inject 0.5 mg into the skin once a week. Shamleffer, Konrad Dolores, MD Taking Active            Med Note Lisette Grinder   Tue Sep 12, 2022  1:15 PM) Started 1 wk ago  sodium polystyrene (SPS) 15 GM/60ML suspension [id number] Yes TAKE 60 MLS BY MOUTH ONCE WEEKLY TO KEEP POTASSIUM DOWN [name], MD Taking Active   SYMBICORT 160-4.5 MCG/ACT inhaler [id number] Yes Inhale 2 puffs by mouth twice daily Sharon Seller, NP Taking Active   torsemide (DEMADEX) 20 MG tablet [id number] Yes TAKE 1 TABLET BY MOUTH EVERY DAY TAKE AN EXTRA DOES FOR WEIGHT GAIN, 3LB  IN 1 DAY OR 5LBS IN 1 WEEK [name], MD Taking Active   traZODone (DESYREL) 50 MG tablet [id number] Yes Take 25 mg by mouth at bedtime. [provider] Taking Active   Vitamin D, Ergocalciferol, (DRISDOL) 1.25 MG (50000 UNIT) CAPS capsule [id number] Yes TAKE 1 CAPSULE BY MOUTH ONCE WEEKLY Candise Che Corene Cornea, MD Taking Active   Med List Note Vernie Ammons, RN 06/15/22 1332): MR 10/01/22.            Home Care and Equipment/Supplies: Any new equipment or medical supplies ordered?: No  Functional Questionnaire: Do you need assistance with bathing/showering or dressing?: Yes (daughter assists, has shower seat) Do you need assistance with meal preparation?: Yes (daughter assists) Do you need assistance with eating?: No Do you have difficulty maintaining continence: No Do you need assistance with getting out of bed/getting out  of a chair/moving?: Yes (uses walker) Do you have difficulty managing or taking your medications?: Yes (daughter provides oversight)  Follow up appointments reviewed: PCP Follow-up appointment confirmed?: No (daughter states she will call and make follow up appointment) MD Provider Line Number:[phone] Given: No Specialist Hospital Follow-up appointment confirmed?: Yes Date of Specialist follow-up appointment?: 01/18/23 Follow-Up Specialty Provider:: Dr. Candise Che oncology Do you need transportation to your follow-up appointment?: No Do you understand care options if your condition(s) worsen?: Yes-patient verbalized understanding    Irving Shows [facility], BSN [place]/ Ambulatory Care Management [phone]

## 2023-01-02 NOTE — Patient Instructions (Signed)
Visit Information  Thank you for taking time to visit with me today. Please don't hesitate to contact me if I can be of assistance to you before our next scheduled telephone appointment.  Following are the goals we discussed today:   Goals Addressed             This Visit's Progress    CCM (DIABETES) EXPECTED OUTCOME:  MONITOR, SELF-MANAGE AND REDUCE SYMPTOMS OF DIABETES       Current Barriers:  Knowledge Deficits related to Diabetes management Chronic Disease Management support and education needs related to Diabetes and diet Cognitive Deficits Per patient's daughter [name], CBG is monitored with Freestyle Libre, states fasting ranges have been in low 100's with no hypoglycemic episodes. Pt has glucose tablets on hand, is trying to eat 3 meals per day plus snack at bedtime.  Daughter reports pt is taking Trazodone 50 mg at hs and this is helping with sleep Patient went to ED on 12/29/22 for chest pain/ anemia, has had no further chest pain and is to follow up with oncologist in September for iron infusion  Planned Interventions: Reviewed medications with patient and discussed importance of medication adherence;        Counseled on importance of regular laboratory monitoring as prescribed;        Advised patient, providing education and rationale, to check cbg per CGM Freestyle [place]  and record        call provider for findings outside established parameters;       Review of patient status, including review of consultants reports, relevant laboratory and other test results, and medications completed;       Advised patient to discuss any issues with blood sugar, hypoglycemia with provider;      Reinforced importance of eating 3 meals per day Reinforced carbohydrate modified diet Reviewed all upcoming scheduled appointments including oncology Transition of care completed   Symptom Management: Take medications as prescribed   Attend all scheduled provider appointments Call pharmacy  for medication refills 3-7 days in advance of running out of medications Attend church or other social activities Perform all self care activities independently  Perform IADL's (shopping, preparing meals, housekeeping, managing finances) independently Call provider office for new concerns or questions  check blood sugar at prescribed times: Freestyle LIbre  check feet daily for cuts, sores or redness enter blood sugar readings and medication or insulin into daily log take the blood sugar log to all doctor visits take the blood sugar meter to all doctor visits trim toenails straight across eat fish at least once per week fill half of plate with vegetables limit fast food meals to no more than 1 per week manage portion size prepare main meal at home 3 to 5 days each week read food labels for fat, fiber, carbohydrates and portion size keep feet up while sitting wash and dry feet carefully every day Try eating a snack at hs with protein and carbohydrate Please stay in contact with your doctor if you have any issues with blood sugar, hypoglycemia (low blood sugar) Follow RULE OF 15 for low blood sugar management:  How to treat low blood sugars (Blood sugar less than 70 mg/dl  Please follow the RULE OF 15 for the treatment of hypoglycemia treatment (When your blood sugars are less than 70 mg/ dl) STEP  1:  Take 15 grams of carbohydrates when your blood sugar is low, which includes:   3-4 glucose tabs or  3-4 oz of juice or  regular soda or  One tube of glucose gel STEP 2:  Recheck blood sugar in 15 minutes STEP 3:  If your blood sugar is still low at the 15 minute recheck ---then, go back to STEP 1 and treat again with another 15 grams of carbohydrates  Follow Up Plan: Telephone follow up appointment with care management team member scheduled for:   03/19/23 at 1015 am       CCM (HYPERTENSION) EXPECTED OUTCOME: MONITOR, SELF-MANAGE AND REDUCE SYMPTOMS OF HYPERTENSION       Current  Barriers:  Knowledge Deficits related to Hypertension management Chronic Disease Management support and education needs related to Hypertension Cognitive Deficits-dementia Spoke with patient's daughter [name] who lives with patient, reports patient has "mild dementia", states pt needs assistance with all ADL's, IADL's, pt has WC, walker, shower seat and stays in bed a lot, had home health PT for strengthening, states pt is trying to do chair and bed exercises prescribed by physical therapist.  Elita Quick is primary caregiver,  pt has another daughter that provides meals, pt refuses PACE program.  [name] denies any needs for social work at present, no new concerns reported, states " found out recently she doesn't qualify for medicaid" , daughter reports she has contact information for 2 companies she will be reaching out to for in home assistance Blood pressure is not monitored at home.  Planned Interventions: Evaluation of current treatment plan related to hypertension self management and patient's adherence to plan as established by provider;   Reviewed medications with patient and discussed importance of compliance;  Advised patient, providing education and rationale, to monitor blood pressure daily and record, calling PCP for findings outside established parameters;  Advised patient to discuss any issues with blood pressure or medications with provider; Reinforced importance of adherence to low sodium diet and reading food labels Reviewed importance of changing positions q 2 hours In basket message sent to Gwenevere Ghazi care guide to provide no further outreaches to schedule social worker, daughter declined  Symptom Management: Take medications as prescribed   Attend all scheduled provider appointments Call pharmacy for medication refills 3-7 days in advance of running out of medications Attend church or other social activities Perform all self care activities independently  Perform IADL's (shopping,  preparing meals, housekeeping, managing finances) independently Call provider office for new concerns or questions  check blood pressure weekly choose a place to take my blood pressure (home, clinic or office, retail store) write blood pressure results in a log or diary learn about high blood pressure keep a blood pressure log take blood pressure log to all doctor appointments call doctor for signs and symptoms of high blood pressure keep all doctor appointments take medications for blood pressure exactly as prescribed report new symptoms to your doctor eat more whole grains, fruits and vegetables, lean meats and healthy fats Follow low sodium diet- read labels for sodium content, limit/ avoid fast food Continue completing chair/ bed exercises prescribed by physical therapy Change positions every 2 hours  Follow Up Plan: Telephone follow up appointment with care management team member scheduled for:  03/19/23 at 1015 am           Our next appointment is by telephone on 03/19/23  at 1015 am  Please call the care guide team at [phone] if you need to cancel or reschedule your appointment.   If you are experiencing a Mental Health or Behavioral Health Crisis or need someone to talk to, please call the Suicide  and Crisis Lifeline: 988 call the [place] National Suicide Prevention Lifeline: 934-617-9853 or TTY: 2396132130 TTY 715-311-1803) to talk to a trained counselor call 1-800-273-TALK (toll free, 24 hour hotline) go to [facility] [address] [place] [phone]) call 911   Patient verbalizes understanding of instructions and care plan provided today and agrees to view in MyChart. Active MyChart status and patient understanding of how to access instructions and care plan via MyChart confirmed with patient.     Telephone follow up appointment with care management team member scheduled for: 03/19/23 at 1015 am  Irving Shows [facility],  BSN [place]/ Ambulatory Care Management [phone]

## 2023-01-03 ENCOUNTER — Inpatient Hospital Stay (HOSPITAL_COMMUNITY)
Admission: EM | Admit: 2023-01-03 | Discharge: 2023-01-06 | DRG: 378 | Disposition: A | Discharge status: Home Health | Payer: PPO | Attending: Internal Medicine | Admitting: Internal Medicine

## 2023-01-03 ENCOUNTER — Other Ambulatory Visit: Payer: Self-pay

## 2023-01-03 ENCOUNTER — Inpatient Hospital Stay: Payer: PPO

## 2023-01-03 ENCOUNTER — Inpatient Hospital Stay (HOSPITAL_BASED_OUTPATIENT_CLINIC_OR_DEPARTMENT_OTHER): Payer: PPO | Admitting: Hematology

## 2023-01-03 ENCOUNTER — Other Ambulatory Visit: Payer: PPO

## 2023-01-03 ENCOUNTER — Encounter (HOSPITAL_COMMUNITY): Payer: Self-pay

## 2023-01-03 ENCOUNTER — Inpatient Hospital Stay (HOSPITAL_COMMUNITY): Payer: PPO

## 2023-01-03 ENCOUNTER — Ambulatory Visit: Payer: PPO | Admitting: Hematology

## 2023-01-03 VITALS — BP 111/43 | HR 68 | Temp 97.7°F | Resp 18

## 2023-01-03 DIAGNOSIS — G4700 Insomnia, unspecified: Secondary | ICD-10-CM | POA: Diagnosis present

## 2023-01-03 DIAGNOSIS — Z86718 Personal history of other venous thrombosis and embolism: Secondary | ICD-10-CM

## 2023-01-03 DIAGNOSIS — Z955 Presence of coronary angioplasty implant and graft: Secondary | ICD-10-CM

## 2023-01-03 DIAGNOSIS — Z7901 Long term (current) use of anticoagulants: Secondary | ICD-10-CM | POA: Diagnosis not present

## 2023-01-03 DIAGNOSIS — D631 Anemia in chronic kidney disease: Secondary | ICD-10-CM | POA: Diagnosis not present

## 2023-01-03 DIAGNOSIS — N281 Cyst of kidney, acquired: Secondary | ICD-10-CM | POA: Diagnosis not present

## 2023-01-03 DIAGNOSIS — Z7951 Long term (current) use of inhaled steroids: Secondary | ICD-10-CM

## 2023-01-03 DIAGNOSIS — Z7985 Long-term (current) use of injectable non-insulin antidiabetic drugs: Secondary | ICD-10-CM

## 2023-01-03 DIAGNOSIS — Z79811 Long term (current) use of aromatase inhibitors: Secondary | ICD-10-CM

## 2023-01-03 DIAGNOSIS — E039 Hypothyroidism, unspecified: Secondary | ICD-10-CM | POA: Diagnosis present

## 2023-01-03 DIAGNOSIS — I10 Essential (primary) hypertension: Secondary | ICD-10-CM | POA: Diagnosis present

## 2023-01-03 DIAGNOSIS — F32A Depression, unspecified: Secondary | ICD-10-CM | POA: Diagnosis not present

## 2023-01-03 DIAGNOSIS — Z808 Family history of malignant neoplasm of other organs or systems: Secondary | ICD-10-CM

## 2023-01-03 DIAGNOSIS — Z8261 Family history of arthritis: Secondary | ICD-10-CM

## 2023-01-03 DIAGNOSIS — E1169 Type 2 diabetes mellitus with other specified complication: Secondary | ICD-10-CM | POA: Diagnosis not present

## 2023-01-03 DIAGNOSIS — Z888 Allergy status to other drugs, medicaments and biological substances status: Secondary | ICD-10-CM

## 2023-01-03 DIAGNOSIS — F03A18 Unspecified dementia, mild, with other behavioral disturbance: Secondary | ICD-10-CM | POA: Diagnosis present

## 2023-01-03 DIAGNOSIS — D649 Anemia, unspecified: Secondary | ICD-10-CM | POA: Diagnosis not present

## 2023-01-03 DIAGNOSIS — M109 Gout, unspecified: Secondary | ICD-10-CM | POA: Diagnosis present

## 2023-01-03 DIAGNOSIS — D500 Iron deficiency anemia secondary to blood loss (chronic): Secondary | ICD-10-CM | POA: Diagnosis not present

## 2023-01-03 DIAGNOSIS — Z79899 Other long term (current) drug therapy: Secondary | ICD-10-CM | POA: Insufficient documentation

## 2023-01-03 DIAGNOSIS — K219 Gastro-esophageal reflux disease without esophagitis: Secondary | ICD-10-CM | POA: Diagnosis present

## 2023-01-03 DIAGNOSIS — E785 Hyperlipidemia, unspecified: Secondary | ICD-10-CM | POA: Diagnosis not present

## 2023-01-03 DIAGNOSIS — Z882 Allergy status to sulfonamides status: Secondary | ICD-10-CM

## 2023-01-03 DIAGNOSIS — D509 Iron deficiency anemia, unspecified: Secondary | ICD-10-CM

## 2023-01-03 DIAGNOSIS — E1122 Type 2 diabetes mellitus with diabetic chronic kidney disease: Secondary | ICD-10-CM | POA: Diagnosis not present

## 2023-01-03 DIAGNOSIS — K921 Melena: Secondary | ICD-10-CM | POA: Diagnosis not present

## 2023-01-03 DIAGNOSIS — R10826 Epigastric rebound abdominal tenderness: Secondary | ICD-10-CM | POA: Diagnosis not present

## 2023-01-03 DIAGNOSIS — Z8489 Family history of other specified conditions: Secondary | ICD-10-CM

## 2023-01-03 DIAGNOSIS — K922 Gastrointestinal hemorrhage, unspecified: Secondary | ICD-10-CM | POA: Diagnosis not present

## 2023-01-03 DIAGNOSIS — E870 Hyperosmolality and hypernatremia: Secondary | ICD-10-CM | POA: Diagnosis not present

## 2023-01-03 DIAGNOSIS — Z794 Long term (current) use of insulin: Secondary | ICD-10-CM

## 2023-01-03 DIAGNOSIS — I130 Hypertensive heart and chronic kidney disease with heart failure and stage 1 through stage 4 chronic kidney disease, or unspecified chronic kidney disease: Secondary | ICD-10-CM | POA: Diagnosis present

## 2023-01-03 DIAGNOSIS — F03A4 Unspecified dementia, mild, with anxiety: Secondary | ICD-10-CM | POA: Diagnosis not present

## 2023-01-03 DIAGNOSIS — Z886 Allergy status to analgesic agent status: Secondary | ICD-10-CM

## 2023-01-03 DIAGNOSIS — D0512 Intraductal carcinoma in situ of left breast: Secondary | ICD-10-CM | POA: Insufficient documentation

## 2023-01-03 DIAGNOSIS — C50812 Malignant neoplasm of overlapping sites of left female breast: Secondary | ICD-10-CM

## 2023-01-03 DIAGNOSIS — E66813 Obesity, class 3: Secondary | ICD-10-CM | POA: Diagnosis present

## 2023-01-03 DIAGNOSIS — K5909 Other constipation: Secondary | ICD-10-CM | POA: Diagnosis present

## 2023-01-03 DIAGNOSIS — Z86711 Personal history of pulmonary embolism: Secondary | ICD-10-CM

## 2023-01-03 DIAGNOSIS — K625 Hemorrhage of anus and rectum: Secondary | ICD-10-CM | POA: Diagnosis not present

## 2023-01-03 DIAGNOSIS — R195 Other fecal abnormalities: Secondary | ICD-10-CM | POA: Diagnosis not present

## 2023-01-03 DIAGNOSIS — Z66 Do not resuscitate: Secondary | ICD-10-CM | POA: Diagnosis not present

## 2023-01-03 DIAGNOSIS — G4733 Obstructive sleep apnea (adult) (pediatric): Secondary | ICD-10-CM | POA: Diagnosis present

## 2023-01-03 DIAGNOSIS — Z993 Dependence on wheelchair: Secondary | ICD-10-CM

## 2023-01-03 DIAGNOSIS — K8020 Calculus of gallbladder without cholecystitis without obstruction: Secondary | ICD-10-CM | POA: Diagnosis not present

## 2023-01-03 DIAGNOSIS — Z6841 Body Mass Index (BMI) 40.0 and over, adult: Secondary | ICD-10-CM

## 2023-01-03 DIAGNOSIS — K439 Ventral hernia without obstruction or gangrene: Secondary | ICD-10-CM | POA: Diagnosis not present

## 2023-01-03 DIAGNOSIS — E559 Vitamin D deficiency, unspecified: Secondary | ICD-10-CM | POA: Insufficient documentation

## 2023-01-03 DIAGNOSIS — G894 Chronic pain syndrome: Secondary | ICD-10-CM | POA: Diagnosis present

## 2023-01-03 DIAGNOSIS — I5032 Chronic diastolic (congestive) heart failure: Secondary | ICD-10-CM | POA: Diagnosis present

## 2023-01-03 DIAGNOSIS — F03A3 Unspecified dementia, mild, with mood disturbance: Secondary | ICD-10-CM | POA: Diagnosis present

## 2023-01-03 DIAGNOSIS — J4489 Other specified chronic obstructive pulmonary disease: Secondary | ICD-10-CM | POA: Diagnosis present

## 2023-01-03 DIAGNOSIS — M353 Polymyalgia rheumatica: Secondary | ICD-10-CM | POA: Diagnosis present

## 2023-01-03 DIAGNOSIS — I252 Old myocardial infarction: Secondary | ICD-10-CM

## 2023-01-03 DIAGNOSIS — E871 Hypo-osmolality and hyponatremia: Secondary | ICD-10-CM | POA: Diagnosis present

## 2023-01-03 DIAGNOSIS — C50919 Malignant neoplasm of unspecified site of unspecified female breast: Secondary | ICD-10-CM | POA: Diagnosis present

## 2023-01-03 DIAGNOSIS — Z833 Family history of diabetes mellitus: Secondary | ICD-10-CM

## 2023-01-03 DIAGNOSIS — I2510 Atherosclerotic heart disease of native coronary artery without angina pectoris: Secondary | ICD-10-CM | POA: Diagnosis present

## 2023-01-03 DIAGNOSIS — Z8711 Personal history of peptic ulcer disease: Secondary | ICD-10-CM

## 2023-01-03 DIAGNOSIS — N184 Chronic kidney disease, stage 4 (severe): Secondary | ICD-10-CM | POA: Diagnosis present

## 2023-01-03 DIAGNOSIS — I129 Hypertensive chronic kidney disease with stage 1 through stage 4 chronic kidney disease, or unspecified chronic kidney disease: Secondary | ICD-10-CM | POA: Diagnosis not present

## 2023-01-03 DIAGNOSIS — Z170 Estrogen receptor positive status [ER+]: Secondary | ICD-10-CM | POA: Insufficient documentation

## 2023-01-03 DIAGNOSIS — R10816 Epigastric abdominal tenderness: Secondary | ICD-10-CM | POA: Diagnosis present

## 2023-01-03 DIAGNOSIS — Z9884 Bariatric surgery status: Secondary | ICD-10-CM

## 2023-01-03 DIAGNOSIS — Z8249 Family history of ischemic heart disease and other diseases of the circulatory system: Secondary | ICD-10-CM

## 2023-01-03 DIAGNOSIS — Z803 Family history of malignant neoplasm of breast: Secondary | ICD-10-CM

## 2023-01-03 DIAGNOSIS — K449 Diaphragmatic hernia without obstruction or gangrene: Secondary | ICD-10-CM | POA: Diagnosis not present

## 2023-01-03 DIAGNOSIS — N189 Chronic kidney disease, unspecified: Secondary | ICD-10-CM | POA: Diagnosis not present

## 2023-01-03 DIAGNOSIS — E1140 Type 2 diabetes mellitus with diabetic neuropathy, unspecified: Principal | ICD-10-CM

## 2023-01-03 DIAGNOSIS — F0390 Unspecified dementia without behavioral disturbance: Secondary | ICD-10-CM | POA: Diagnosis present

## 2023-01-03 LAB — CMP (CANCER CENTER ONLY)
ALT: 12 U/L (ref 0–44)
AST: 20 U/L (ref 15–41)
Albumin: 3 g/dL — ABNORMAL LOW (ref 3.5–5.0)
Alkaline Phosphatase: 73 U/L (ref 38–126)
Anion gap: 4 — ABNORMAL LOW (ref 5–15)
BUN: 77 mg/dL — ABNORMAL HIGH (ref 8–23)
CO2: 29 mmol/L (ref 22–32)
Calcium: 8.5 mg/dL — ABNORMAL LOW (ref 8.9–10.3)
Chloride: 105 mmol/L (ref 98–111)
Creatinine: 2.22 mg/dL — ABNORMAL HIGH (ref 0.44–1.00)
GFR, Estimated: 21 mL/min — ABNORMAL LOW (ref >60)
Glucose, Bld: 144 mg/dL — ABNORMAL HIGH (ref 70–99)
Potassium: 4.3 mmol/L (ref 3.5–5.1)
Sodium: 138 mmol/L (ref 135–145)
Total Bilirubin: 0.3 mg/dL (ref 0.3–1.2)
Total Protein: 6.8 g/dL (ref 6.5–8.1)

## 2023-01-03 LAB — COMPREHENSIVE METABOLIC PANEL
ALT: 16 U/L (ref 0–44)
AST: 22 U/L (ref 15–41)
Albumin: 2.7 g/dL — ABNORMAL LOW (ref 3.5–5.0)
Alkaline Phosphatase: 69 U/L (ref 38–126)
Anion gap: 9 (ref 5–15)
BUN: 75 mg/dL — ABNORMAL HIGH (ref 8–23)
CO2: 25 mmol/L (ref 22–32)
Calcium: 8.7 mg/dL — ABNORMAL LOW (ref 8.9–10.3)
Chloride: 103 mmol/L (ref 98–111)
Creatinine, Ser: 2.32 mg/dL — ABNORMAL HIGH (ref 0.44–1.00)
GFR, Estimated: 20 mL/min — ABNORMAL LOW (ref >60)
Glucose, Bld: 142 mg/dL — ABNORMAL HIGH (ref 70–99)
Potassium: 4.6 mmol/L (ref 3.5–5.1)
Sodium: 137 mmol/L (ref 135–145)
Total Bilirubin: 0.4 mg/dL (ref 0.3–1.2)
Total Protein: 6.7 g/dL (ref 6.5–8.1)

## 2023-01-03 LAB — CBC WITH DIFFERENTIAL/PLATELET
Abs Immature Granulocytes: 0.02 10*3/uL (ref 0.00–0.07)
Basophils Absolute: 0 10*3/uL (ref 0.0–0.1)
Basophils Relative: 0 %
Eosinophils Absolute: 0.3 10*3/uL (ref 0.0–0.5)
Eosinophils Relative: 4 %
HCT: 23.4 % — ABNORMAL LOW (ref 36.0–46.0)
Hemoglobin: 7 g/dL — ABNORMAL LOW (ref 12.0–15.0)
Immature Granulocytes: 0 %
Lymphocytes Relative: 19 %
Lymphs Abs: 1.4 10*3/uL (ref 0.7–4.0)
MCH: 27.8 pg (ref 26.0–34.0)
MCHC: 29.9 g/dL — ABNORMAL LOW (ref 30.0–36.0)
MCV: 92.9 fL (ref 80.0–100.0)
Monocytes Absolute: 0.9 10*3/uL (ref 0.1–1.0)
Monocytes Relative: 11 %
Neutro Abs: 4.9 10*3/uL (ref 1.7–7.7)
Neutrophils Relative %: 66 %
Platelets: 213 10*3/uL (ref 150–400)
RBC: 2.52 MIL/uL — ABNORMAL LOW (ref 3.87–5.11)
RDW: 14.9 % (ref 11.5–15.5)
WBC: 7.6 10*3/uL (ref 4.0–10.5)
nRBC: 0 % (ref 0.0–0.2)

## 2023-01-03 LAB — CK: Total CK: 89 U/L (ref 38–234)

## 2023-01-03 LAB — RETICULOCYTES
Immature Retic Fract: 20.6 % — ABNORMAL HIGH (ref 2.3–15.9)
RBC.: 2.52 MIL/uL — ABNORMAL LOW (ref 3.87–5.11)
Retic Count, Absolute: 51.4 10*3/uL (ref 19.0–186.0)
Retic Ct Pct: 2 % (ref 0.4–3.1)

## 2023-01-03 LAB — CBC WITH DIFFERENTIAL (CANCER CENTER ONLY)
Abs Immature Granulocytes: 0.02 10*3/uL (ref 0.00–0.07)
Basophils Absolute: 0 10*3/uL (ref 0.0–0.1)
Basophils Relative: 0 %
Eosinophils Absolute: 0.3 10*3/uL (ref 0.0–0.5)
Eosinophils Relative: 4 %
HCT: 23.1 % — ABNORMAL LOW (ref 36.0–46.0)
Hemoglobin: 7.1 g/dL — ABNORMAL LOW (ref 12.0–15.0)
Immature Granulocytes: 0 %
Lymphocytes Relative: 18 %
Lymphs Abs: 1.2 10*3/uL (ref 0.7–4.0)
MCH: 28.1 pg (ref 26.0–34.0)
MCHC: 30.7 g/dL (ref 30.0–36.0)
MCV: 91.3 fL (ref 80.0–100.0)
Monocytes Absolute: 0.7 10*3/uL (ref 0.1–1.0)
Monocytes Relative: 11 %
Neutro Abs: 4.2 10*3/uL (ref 1.7–7.7)
Neutrophils Relative %: 67 %
Platelet Count: 216 10*3/uL (ref 150–400)
RBC: 2.53 MIL/uL — ABNORMAL LOW (ref 3.87–5.11)
RDW: 14.9 % (ref 11.5–15.5)
WBC Count: 6.4 10*3/uL (ref 4.0–10.5)
nRBC: 0 % (ref 0.0–0.2)

## 2023-01-03 LAB — GLUCOSE, CAPILLARY: Glucose-Capillary: 117 mg/dL — ABNORMAL HIGH (ref 70–99)

## 2023-01-03 LAB — PREPARE RBC (CROSSMATCH)

## 2023-01-03 LAB — PHOSPHORUS: Phosphorus: 3.2 mg/dL (ref 2.5–4.6)

## 2023-01-03 LAB — POC OCCULT BLOOD, ED: Fecal Occult Bld: POSITIVE — AB

## 2023-01-03 LAB — TSH: TSH: 0.804 u[IU]/mL (ref 0.350–4.500)

## 2023-01-03 LAB — CBG MONITORING, ED: Glucose-Capillary: 145 mg/dL — ABNORMAL HIGH (ref 70–99)

## 2023-01-03 LAB — MAGNESIUM: Magnesium: 1.5 mg/dL — ABNORMAL LOW (ref 1.7–2.4)

## 2023-01-03 LAB — FERRITIN: Ferritin: 77 ng/mL (ref 11–307)

## 2023-01-03 MED ORDER — ACETAMINOPHEN 650 MG RE SUPP: 650.0000 mg | Freq: Four times a day (QID) | RECTAL | Status: DC | PRN

## 2023-01-03 MED ORDER — SODIUM CHLORIDE 0.9% IV SOLUTION: Freq: Once | INTRAVENOUS | Status: AC

## 2023-01-03 MED ORDER — ONDANSETRON HCL 4 MG PO TABS: 4.0000 mg | ORAL_TABLET | Freq: Four times a day (QID) | ORAL | Status: DC | PRN

## 2023-01-03 MED ORDER — HYDROCODONE-ACETAMINOPHEN 5-325 MG PO TABS
1.0000 | ORAL_TABLET | ORAL | Status: DC | PRN
  Administered 2023-01-04 – 2023-01-05 (×4): 1 via ORAL
  Filled 2023-01-03 (×4): qty 1

## 2023-01-03 MED ORDER — MOMETASONE FURO-FORMOTEROL FUM 200-5 MCG/ACT IN AERO
2.0000 | INHALATION_SPRAY | Freq: Two times a day (BID) | RESPIRATORY_TRACT | Status: DC
  Administered 2023-01-04 – 2023-01-06 (×5): 2 via RESPIRATORY_TRACT
  Filled 2023-01-03: qty 8.8

## 2023-01-03 MED ORDER — PANTOPRAZOLE INFUSION (NEW) - SIMPLE MED
8.0000 mg/h | INTRAVENOUS | Status: DC
  Administered 2023-01-03 – 2023-01-04 (×2): 8 mg/h via INTRAVENOUS
  Filled 2023-01-03 (×2): qty 80

## 2023-01-03 MED ORDER — SODIUM CHLORIDE 0.9 % IV SOLN: INTRAVENOUS | Status: AC

## 2023-01-03 MED ORDER — ALBUTEROL SULFATE (2.5 MG/3ML) 0.083% IN NEBU: 2.5000 mg | INHALATION_SOLUTION | RESPIRATORY_TRACT | Status: DC | PRN

## 2023-01-03 MED ORDER — INSULIN GLARGINE (2 UNIT DIAL) 300 UNIT/ML ~~LOC~~ SOPN: 30.0000 [IU] | PEN_INJECTOR | Freq: Every day | SUBCUTANEOUS | Status: DC

## 2023-01-03 MED ORDER — PANTOPRAZOLE SODIUM 40 MG IV SOLR: 40.0000 mg | Freq: Two times a day (BID) | INTRAVENOUS | Status: DC

## 2023-01-03 MED ORDER — ONDANSETRON HCL 4 MG/2ML IJ SOLN: 4.0000 mg | Freq: Four times a day (QID) | INTRAMUSCULAR | Status: DC | PRN

## 2023-01-03 MED ORDER — MEMANTINE HCL 10 MG PO TABS: 5.0000 mg | ORAL_TABLET | Freq: Two times a day (BID) | ORAL | Status: DC

## 2023-01-03 MED ORDER — ACETAMINOPHEN 325 MG PO TABS
650.0000 mg | ORAL_TABLET | Freq: Four times a day (QID) | ORAL | Status: DC | PRN
  Administered 2023-01-03: 650 mg via ORAL
  Filled 2023-01-03: qty 2

## 2023-01-03 MED ORDER — INSULIN GLARGINE-YFGN 100 UNIT/ML ~~LOC~~ SOLN
30.0000 [IU] | Freq: Every day | SUBCUTANEOUS | Status: DC
  Filled 2023-01-03: qty 0.3

## 2023-01-03 MED ORDER — MEMANTINE HCL 10 MG PO TABS
5.0000 mg | ORAL_TABLET | Freq: Every day | ORAL | Status: DC
  Administered 2023-01-03 – 2023-01-05 (×3): 5 mg via ORAL
  Filled 2023-01-03 (×3): qty 1

## 2023-01-03 MED ORDER — MONTELUKAST SODIUM 10 MG PO TABS
10.0000 mg | ORAL_TABLET | Freq: Every day | ORAL | Status: DC
  Administered 2023-01-03 – 2023-01-05 (×3): 10 mg via ORAL
  Filled 2023-01-03 (×3): qty 1

## 2023-01-03 MED ORDER — INSULIN ASPART 100 UNIT/ML IJ SOLN
0.0000 [IU] | INTRAMUSCULAR | Status: DC
  Administered 2023-01-03: 1 [IU] via SUBCUTANEOUS
  Administered 2023-01-04 (×2): 2 [IU] via SUBCUTANEOUS
  Administered 2023-01-05: 5 [IU] via SUBCUTANEOUS
  Administered 2023-01-06: 3 [IU] via SUBCUTANEOUS
  Administered 2023-01-06: 2 [IU] via SUBCUTANEOUS
  Filled 2023-01-03: qty 0.09

## 2023-01-03 MED ORDER — MEMANTINE HCL 10 MG PO TABS
10.0000 mg | ORAL_TABLET | Freq: Every day | ORAL | Status: DC
  Administered 2023-01-04 – 2023-01-06 (×3): 10 mg via ORAL
  Filled 2023-01-03 (×3): qty 1

## 2023-01-03 MED ORDER — SODIUM CHLORIDE 0.9% IV SOLUTION: Freq: Once | INTRAVENOUS | Status: DC

## 2023-01-03 MED ORDER — TRAZODONE HCL 50 MG PO TABS
25.0000 mg | ORAL_TABLET | Freq: Every day | ORAL | Status: DC
  Administered 2023-01-03 – 2023-01-05 (×3): 25 mg via ORAL
  Filled 2023-01-03 (×3): qty 1

## 2023-01-03 MED ORDER — ALBUTEROL SULFATE HFA 108 (90 BASE) MCG/ACT IN AERS: 1.0000 | INHALATION_SPRAY | Freq: Four times a day (QID) | RESPIRATORY_TRACT | Status: DC | PRN

## 2023-01-03 MED ORDER — PANTOPRAZOLE 80MG IVPB - SIMPLE MED
80.0000 mg | Freq: Once | INTRAVENOUS | Status: AC
  Administered 2023-01-03: 80 mg via INTRAVENOUS
  Filled 2023-01-03: qty 80

## 2023-01-03 NOTE — Assessment & Plan Note (Signed)
Follow-up in the outpatient nutritional clinic

## 2023-01-03 NOTE — Assessment & Plan Note (Signed)
Order sliding scale continue insulin but decrease down to 30 units given at patient n.p.o.

## 2023-01-03 NOTE — Assessment & Plan Note (Signed)
Chronic stable monitor fluid status this patient is going to get blood products

## 2023-01-03 NOTE — Subjective & Objective (Signed)
Patient presents with history of dark stool and was found to be anemic at the cancer center.  She has known history of CKD at first was thought to be secondary to renal etiology but given dark stool patient was sent to emergency department.  Patient is taking iron supplement she has been feeling extra fatigued weak and somewhat lightheaded Stool now appears to be more maroon She was Hemoccult positive in the emergency department and her hemoglobin was found to be down to 7

## 2023-01-03 NOTE — Assessment & Plan Note (Signed)
Chronic stable monitor for any sign of decompensation

## 2023-01-03 NOTE — Assessment & Plan Note (Signed)
Hold Eliquis. ?

## 2023-01-03 NOTE — Assessment & Plan Note (Signed)
Chronic stable continue Lipitor 80 mg a day

## 2023-01-03 NOTE — Assessment & Plan Note (Signed)
Continue statin Lipitor 80 mg a day resume Coreg when able to tolerate

## 2023-01-03 NOTE — ED Provider Notes (Signed)
[facility] AT [facility] Provider Note   CSN: [id number] Arrival date & time: 01/03/23  1542     History Chief Complaint  Patient presents with   Rectal Bleeding    HPI [name] is a 88 y.o. female presenting for anemia.  History of hypertension, hyperlipidemia, anticoagulation, diastolic heart failure, COPD, esophagitis, diabetes Was sent by oncology clinic for detected anemia.  Has CKD stage IV.  Suspected renal etiology but patient reported history of dark stool. Patient's daughter provides much of the care.  Patient is on iron supplement.  Dark stool appears to be resolving into the day per the daughter.   Patient's recorded medical, surgical, social, medication list and allergies were reviewed in the Snapshot window as part of the initial history.   Review of Systems   Review of Systems  Constitutional:  Positive for fatigue. Negative for chills and fever.  HENT:  Negative for ear pain and sore throat.   Eyes:  Negative for pain and visual disturbance.  Respiratory:  Negative for cough and shortness of breath.   Cardiovascular:  Negative for chest pain and palpitations.  Gastrointestinal:  Positive for blood in stool. Negative for abdominal pain and vomiting.  Genitourinary:  Negative for dysuria and hematuria.  Musculoskeletal:  Negative for arthralgias and back pain.  Skin:  Negative for color change and rash.  Neurological:  Positive for weakness and light-headedness. Negative for seizures and syncope.  All other systems reviewed and are negative.   Physical Exam Updated Vital Signs BP (!) 128/50   Pulse 61   Temp 97.8 F (36.6 C) (Oral)   Resp 10   SpO2 100%  Physical Exam Vitals and nursing note reviewed. Exam conducted with a chaperone present.  Constitutional:      General: She is not in acute distress.    Appearance: She is well-developed.  HENT:     Head: Normocephalic and atraumatic.  Eyes:      Conjunctiva/sclera: Conjunctivae normal.  Cardiovascular:     Rate and Rhythm: Normal rate and regular rhythm.     Heart sounds: No murmur heard. Pulmonary:     Effort: Pulmonary effort is normal. No respiratory distress.     Breath sounds: Normal breath sounds.  Abdominal:     General: There is no distension.     Palpations: Abdomen is soft.     Tenderness: There is no abdominal tenderness. There is no right CVA tenderness or left CVA tenderness.  Genitourinary:    Rectum: Guaiac result positive.     Comments: Maroon stool. Musculoskeletal:        General: No swelling or tenderness. Normal range of motion.     Cervical back: Neck supple.  Skin:    General: Skin is warm and dry.  Neurological:     General: No focal deficit present.     Mental Status: She is alert and oriented to person, place, and time. Mental status is at baseline.     Cranial Nerves: No cranial nerve deficit.      ED Course/ Medical Decision Making/ A&P    Procedures Procedures   Medications Ordered in ED Medications  0.9 %  sodium chloride infusion (Manually program via Guardrails IV Fluids) (has no administration in time range)    Medical Decision Making:    [name] is a 88 y.o. female who presented to the ED today with weakness fatigue and discoloration of her stool.  Initially she thought it was  dark but today family notes it has become a more maroon detailed above.     Patient's presentation is complicated by their history of multiple comorbid medical problems.  Patient placed on continuous vitals and telemetry monitoring while in ED which was reviewed periodically.   Complete initial physical exam performed, notably the patient  was hemodynamically stable no acute distress.  Patient feels very fatigued.      Reviewed and confirmed nursing documentation for past medical history, family history, social history.    Initial Assessment:   With the patient's presentation of fatigue and  stool discoloration, most likely diagnosis is anemia secondary to GI bleed. Other diagnoses were considered including (but not limited to) chronic anemia/iron deficiency anemia. These are considered less likely due to history of present illness and physical exam findings.   This is most consistent with an acute life/limb threatening illness complicated by underlying chronic conditions.  Initial Plan:  Fecal occult blood testing positive for hemorrhage products Screening labs including CBC and Metabolic panel to evaluate for infectious or metabolic etiology of disease.  Iron profile to evaluate for underlying etiology of her anemia Objective evaluation as below reviewed with plan for close reassessment  Initial Study Results:   Laboratory  All laboratory results reviewed without evidence of clinically relevant pathology.     EKG EKG was reviewed independently. Rate, rhythm, axis, intervals all examined and without medically relevant abnormality. ST segments without concerns for elevations.    Consults:  Case discussed with [facility].   Reassessment and Plan:   Patient's hemoglobin resulted at 7.0.  She is symptomatic with lightheadedness and weakness, has an active downtrending changes in the color of her stool.  Will transfuse.  Patient consented after risk-benefit conversation. [place] was consulted for management of her likely developing GI bleed. No frank bleeding appreciated on rectal exam. Will admit to hospitalist for ongoing care and management in the interim. Disposition:   Based on the above findings, I believe this patient is stable for admission.    Patient/family educated about specific findings on our evaluation and explained exact reasons for admission.  Patient/family educated about clinical situation and time was allowed to answer questions.   Admission team communicated with and agreed with need for admission. Patient admitted. Patient ready to move at  this time.     Emergency Department Medication Summary:   Medications  0.9 %  sodium chloride infusion (Manually program via Guardrails IV Fluids) (has no administration in time range)        Emergency Department Medication Summary:   Medications  0.9 %  sodium chloride infusion (Manually program via Guardrails IV Fluids) (has no administration in time range)         Clinical Impression: No diagnosis found.   Data Unavailable   Final Clinical Impression(s) / ED Diagnoses Final diagnoses:  None    Rx / DC Orders ED Discharge Orders     None         Glyn Ade, MD 01/03/23 2015

## 2023-01-03 NOTE — Assessment & Plan Note (Signed)
Continue any home medications currently noncontributory

## 2023-01-03 NOTE — Assessment & Plan Note (Signed)
Transfuse 1 unit for right now repeat serial CBC and monitor continue transfuse as needed

## 2023-01-03 NOTE — Assessment & Plan Note (Signed)
Allow permissive hypertension for tonight 

## 2023-01-03 NOTE — Assessment & Plan Note (Signed)
Hold eliquis

## 2023-01-03 NOTE — H&P (Signed)
[name]:[id number] DOB: 1935/04/14 DOA: 01/03/2023     PCP: [name], MD   Outpatient Specialists: * NONE CARDS: Abbott Pao,  NEurology    Dr. Everlena Cooper   Oncology  Dr. Candise Che   Patient arrived to ER on 01/03/23 at 1542 Referred by Attending Glyn Ade, MD   Patient coming from:    home Lives With family      Chief Complaint:   Chief Complaint  Patient presents with   Rectal Bleeding    HPI: [name] is a 88 y.o. female with medical history significant of HTN HLD diastolic CHF COPD esophagitis DM2 CKD stage IV, breast cancer    Presented with found to be anemic lightheaded stools  Patient presents with history of dark stool and was found to be anemic at the cancer center.  [name] has known history of CKD at first was thought to be secondary to renal etiology but given dark stool patient was sent to emergency department.  Patient is taking iron supplement [name] has been feeling extra fatigued weak and somewhat lightheaded Stool now appears to be more maroon [name] was Hemoccult positive in the emergency department and her hemoglobin was found to be down to 7 [name] was just recently hospitalized for chest pain and symptomatic anemia CT scan at that time showed no acute findings [name] has been having some abdominal discomfort on  The black stools has been for 2 wks but now for the past 2 days maroon Pt had some concerns if [name] is getting all her medications Will need to further clarify with the family, after discussion it seems [name] is getting all her meds a in pill pack, her insulin and her pain meds when [name] asks  [name] endorses generalized muscle pain and cramps Endorses deconditioning and generalized fatigue Denies significant ETOH intake   Does not smoke   Lab Results  Component Value Date   SARSCOV2NAA NEGATIVE 12/29/2022   SARSCOV2NAA NOT DETECTED 08/24/2021   SARSCOV2NAA NEGATIVE 07/04/2021   SARSCOV2NAA NOT DETECTED 11/16/2020     Regarding pertinent Chronic problems:    Hyperlipidemia -  on statins Lipitor (atorvastatin)  Lipid Panel     Component Value Date/Time   CHOL 97 01/26/2021 1405   CHOL 136 04/20/2015 1001   TRIG 109 01/26/2021 1405   HDL 36 (L) 01/26/2021 1405   HDL 40 04/20/2015 1001   CHOLHDL 2.7 01/26/2021 1405   VLDL 17 02/15/2020 0636   LDLCALC 42 01/26/2021 1405   LABVLDL 38 04/20/2015 1001     HTN on Coreg,    chronic CHF diastolic/systolic/ combined - last echo  Recent Results (from the past 95621 hour(s))  ECHOCARDIOGRAM COMPLETE   Collection Time: 07/05/21  8:30 AM  Result Value   BP 126/50   Single Plane A2C EF 57.9   Single Plane A4C EF 50.6   Calc EF 54.4   Area-P 1/2 2.79   Narrative      ECHOCARDIOGRAM REPORT        1. Left ventricular ejection fraction, by estimation, is 55 to 60%. The left ventricle has normal function. Left ventricular endocardial border not optimally defined to evaluate regional wall motion- there are no wall motion abnormalities seen in views  obtained. Left ventricular diastolic parameters are consistent with Grade I diastolic dysfunction (impaired relaxation).  2. Right ventricular systolic function is normal. The right ventricular size is normal.  3. The mitral valve was not well visualized. No evidence of  mitral valve regurgitation. No evidence of mitral stenosis.  4. The aortic valve was not well visualized. Aortic valve regurgitation is not visualized.            CAD  - On  , statin, betablocker,                 -  followed by cardiology               coronary artery disease with stress test in February 2016 suggesting likely multivessel disease        DM 2 -  Lab Results  Component Value Date   HGBA1C 7.8 (A) 08/21/2022   on insulin      Morbid obesity-   BMI Readings from Last 1 Encounters:  12/29/22 42.60 kg/m        COPD - not  followed by pulmonology   not  on baseline oxygen   On Symbicort     Hx of DVT/PE 2023 on -  anticoagulation with Eliquis,  Description   Coumadin 5mg  daily except 7.5mg  on Tuesdays and Thursdays      CKD stage IV-   baseline Cr 2.3 Estimated Creatinine Clearance: 23.7 mL/min (A) (by C-G formula based on SCr of 2.32 mg/dL (H)).  Lab Results  Component Value Date   CREATININE 2.32 (H) 01/03/2023   CREATININE 2.22 (H) 01/03/2023   CREATININE 2.76 (H) 12/29/2022     Chronic anemia - baseline hg Hemoglobin & Hematocrit  Recent Labs    12/29/22 1239 01/03/23 1415 01/03/23 1635  HGB 7.6* 7.1* 7.0*   Iron/TIBC/Ferritin/ %Sat    Component Value Date/Time   IRON 27 (L) 07/11/2022 1253   TIBC 239 (L) 07/11/2022 1253   FERRITIN 77 01/03/2023 1416   FERRITIN 50 03/21/2017 1351   IRONPCTSAT 11 07/11/2022 1253   IRONPCTSAT 17 05/05/2022 1624    Cancer: Breast cancer patient has not been able to take Arimidex     While in ER:    Found to have Hg of 7.0 Hem occult positive    Lab Orders         CBC with Differential         Comprehensive metabolic panel         Iron and TIBC         Ferritin (Iron Binding Protein)         POC occult blood, ED Provider will collect       Following Medications were ordered in ER: Medications  0.9 %  sodium chloride infusion (Manually program via Guardrails IV Fluids) (has no administration in time range)    _______________________________________________________ ER Provider Called:     [facility]  Dr.Nandigam  They Recommend admit to medicine   Will see in AM        ED Triage Vitals  Encounter Vitals Group     BP 01/03/23 1559 (!) 116/46     Systolic BP Percentile --      Diastolic BP Percentile --      Pulse Rate 01/03/23 1559 62     Resp 01/03/23 1559 18     Temp 01/03/23 1559 97.8 F (36.6 C)     Temp Source 01/03/23 1559 Oral     SpO2 01/03/23 1559 100 %     Weight --      Height --      Head Circumference --      Peak Flow --      Pain Score 01/03/23  1700 5     Pain Loc --      Pain Education --      Exclude  from Growth Chart --   [id number]     _________________________________________ Significant initial  Findings: Abnormal Labs Reviewed  CBC WITH DIFFERENTIAL/PLATELET - Abnormal; Notable for the following components:      Result Value   RBC 2.52 (*)    Hemoglobin 7.0 (*)    HCT 23.4 (*)    MCHC 29.9 (*)    All other components within normal limits  COMPREHENSIVE METABOLIC PANEL - Abnormal; Notable for the following components:   Glucose, Bld 142 (*)    BUN 75 (*)    Creatinine, Ser 2.32 (*)    Calcium 8.7 (*)    Albumin 2.7 (*)    GFR, Estimated 20 (*)    All other components within normal limits  POC OCCULT BLOOD, ED - Abnormal; Notable for the following components:   Fecal Occult Bld POSITIVE (*)    All other components within normal limits     ECG: Ordered Personally reviewed and interpreted by me showing: HR : 72 Rhythm: Sinus rhythm Prolonged PR interval QTC 455  BNP (last 3 results) Recent Labs    02/08/22 1556 03/26/22 2135  BNP 56.5 132.6*     COVID-19 Labs  Recent Labs    01/03/23 1416  FERRITIN 77    Lab Results  Component Value Date   SARSCOV2NAA NEGATIVE 12/29/2022   SARSCOV2NAA NOT DETECTED 08/24/2021   SARSCOV2NAA NEGATIVE 07/04/2021   SARSCOV2NAA NOT DETECTED 11/16/2020     Vitals:   01/03/23 1830 01/03/23 1900 01/03/23 1920 01/03/23 1922  BP: (!) 115/37 (!) 108/39 (!) 127/47   Pulse: 67 65 68   Resp: 18 18 20    Temp:    98 F (36.7 C)  TempSrc:    Oral  SpO2: 99% 99% 100%     WBC     Component Value Date/Time   WBC 7.6 01/03/2023 1635   WBC 6.4 01/03/2023 1415   LYMPHSABS 1.4 01/03/2023 1635   LYMPHSABS 2.4 03/21/2017 1351   MONOABS 0.9 01/03/2023 1635   MONOABS 0.7 03/21/2017 1351   EOSABS 0.3 01/03/2023 1635   EOSABS 0.2 03/21/2017 1351   BASOSABS 0.0 01/03/2023 1635   BASOSABS 0.0 03/21/2017 1351       Results for orders placed or performed during the hospital encounter of 12/29/22  SARS Coronavirus 2 by RT PCR  (hospital order, performed in [facility] hospital lab) *cepheid single result test* Anterior Nasal Swab     Status: None   Collection Time: 12/29/22 12:37 PM   Specimen: Anterior Nasal Swab  Result Value Ref Range Status   SARS Coronavirus 2 by RT PCR NEGATIVE NEGATIVE Final    Comment: Performed at [facility], [address], [place], [place] [address]   *Note: Due to a large number of results and/or encounters for the requested time period, some results have not been displayed. A complete set of results can be found in Results Review.    ABX started Antibiotics Given (last 72 hours)     None       No results found for the last 90 days.    ABG    Component Value Date/Time   PHART 7.419 10/14/2013 1836   PCO2ART 36.7 10/14/2013 1836   PO2ART 77.0 (L) 10/14/2013 1836   HCO3 20.8 07/04/2021 1853   TCO2 22 07/04/2021 1853   ACIDBASEDEF 4.0 (H)  07/04/2021 1853   O2SAT 58 07/04/2021 1853   ____________________________________________________ Recent Labs  Lab 12/29/22 1239 01/03/23 1415 01/03/23 1635  NA 137 138 137  K 4.2 4.3 4.6  CO2 21* 29 25  GLUCOSE 218* 144* 142*  BUN 100* 77* 75*  CREATININE 2.76* 2.22* 2.32*  CALCIUM 8.5* 8.5* 8.7*    Cr    stable,    Lab Results  Component Value Date   CREATININE 2.32 (H) 01/03/2023   CREATININE 2.22 (H) 01/03/2023   CREATININE 2.76 (H) 12/29/2022    Recent Labs  Lab 12/29/22 1239 01/03/23 1415 01/03/23 1635  AST 16 20 22   ALT 12 12 16   ALKPHOS 82 73 69  BILITOT 0.4 0.3 0.4  PROT 6.9 6.8 6.7  ALBUMIN 2.6* 3.0* 2.7*   Lab Results  Component Value Date   CALCIUM 8.7 (L) 01/03/2023   PHOS 3.8 03/27/2022    Plt: Lab Results  Component Value Date   PLT 213 01/03/2023    Recent Labs  Lab 12/29/22 1239 01/03/23 1415 01/03/23 1635  WBC 12.4* 6.4 7.6  NEUTROABS  --  4.2 4.9  HGB 7.6* 7.1* 7.0*  HCT 25.3* 23.1* 23.4*  MCV 92.0 91.3 92.9  PLT 246 216 213    HG/HCT   Down   from baseline see  below    Component Value Date/Time   HGB 7.0 (L) 01/03/2023 1635   HGB 7.1 (L) 01/03/2023 1415   HGB 9.3 (L) 03/02/2020 1517   HGB 8.7 (L) 03/21/2017 1351   HCT 23.4 (L) 01/03/2023 1635   HCT 29.5 (L) 03/02/2020 1517   HCT 28.9 (L) 03/21/2017 1351   MCV 92.9 01/03/2023 1635   MCV 88 03/02/2020 1517   MCV 84.3 03/21/2017 1351      _______________________________________________ Hospitalist was called for admission for upper GI bleed  The following Work up has been ordered so far:  Orders Placed This Encounter  Procedures   CBC with Differential   Comprehensive metabolic panel   Iron and TIBC   Ferritin (Iron Binding Protein)   Informed Consent Details: Physician/Practitioner Attestation; Transcribe to consent form and obtain patient signature   Consult to gastroenterology  LEBAUR   Consult to hospitalist   POC occult blood, ED Provider will collect   ED EKG   Type and screen [facility]   Prepare RBC (crossmatch)     OTHER Significant initial  Findings:  labs showing:     DM  labs:  HbA1C: Recent Labs    02/12/22 0722 08/21/22 1148  HGBA1C 7.1* 7.8*      CBG (last 3)  No results for input(s): "GLUCAP" in the last 72 hours.        Cultures:    Component Value Date/Time   SDES URINE, CLEAN CATCH 10/15/2021 0757   SPECREQUEST  10/15/2021 0757    NONE Performed at [facility], [address] [place], [place] [address]    CULT >=100,000 COLONIES/mL ESCHERICHIA COLI (A) 10/15/2021 0757   REPTSTATUS 10/17/2021 FINAL 10/15/2021 0757     Radiological Exams on Admission: No results found. _______________________________________________________________________________________________________ Latest  Blood pressure (!) 127/47, pulse 68, temperature 98 F (36.7 C), temperature source Oral, resp. rate 20, SpO2 100%.   Vitals  labs and radiology finding personally reviewed  Review of Systems:    Pertinent positives include:    melena, maroon stools lightheadedness  Constitutional:  No weight loss, night sweats, Fevers, chills, fatigue, weight loss  HEENT:  No  headaches, Difficulty swallowing,Tooth/dental problems,Sore throat,  No sneezing, itching, ear ache, nasal congestion, post nasal drip,  Cardio-vascular:  No chest pain, Orthopnea, PND, anasarca, dizziness, palpitations.no Bilateral lower extremity swelling  GI:  No heartburn, indigestion, abdominal pain, nausea, vomiting, diarrhea, change in bowel habits, loss of appetite, blood in stool, hematemesis Resp:  no shortness of breath at rest. No dyspnea on exertion, No excess mucus, no productive cough, No non-productive cough, No coughing up of blood.No change in color of mucus.No wheezing. Skin:  no rash or lesions. No jaundice GU:  no dysuria, change in color of urine, no urgency or frequency. No straining to urinate.  No flank pain.  Musculoskeletal:  No joint pain or no joint swelling. No decreased range of motion. No back pain.  Psych:  No change in mood or affect. No depression or anxiety. No memory loss.  Neuro: no localizing neurological complaints, no tingling, no weakness, no double vision, no gait abnormality, no slurred speech, no confusion  All systems reviewed and apart from HOPI all are negative _______________________________________________________________________________________________ Past Medical History:   Past Medical History:  Diagnosis Date   Abdominal pain 01/26/2012   Acute deep vein thrombosis (DVT) of femoral vein of right lower extremity (HCC) 03/26/2022   Acute kidney injury superimposed on chronic kidney disease (HCC) 12/18/2014   Acute upper GI bleed 03/26/2022   Allergy    takes Mucinex daily as needed   Anemia, unspecified    Anxiety    takes Clonazepam daily as needed   Anxiety associated with depression 06/08/2017   Arthritis    Breast cancer (HCC)    Breast cancer screening 02/19/2014   Breast mass  10/27/2014   Chronic diastolic CHF (congestive heart failure) (HCC)    takes Furosemide daily   Chronic pain syndrome 02/06/2015   CKD (chronic kidney disease), stage III (HCC)    Coronary artery disease    a. s/p IMI 2004 tx with BMS to RCA;  b. s/p Promus DES to RCA 2/12 c. abnormal nuc 2016 -> cath with med rx. d. NSTEMI 02/2020  mv CAD with severe ISR in pRCA and m/dRCA lesion both treated with DES.   Debility 08/01/2012   Dementia (HCC)    Depression    takes Cymbalta daily   Diabetes mellitus    insulin daily   DOE (dyspnea on exertion) 06/24/2014   Dysphagia, unspecified(787.20) 07/31/2012   Fungal dermatitis 11/13/2007   Qualifier: Diagnosis of  By: Amador Cunas  MD, Janett Labella    GERD (gastroesophageal reflux disease)    takes Protonix daily   Gout, unspecified    Headache    occasionally   History of anemia due to chronic kidney disease 03/26/2022   History of blood clots    History of deep venous thrombosis 01/27/2012   History of pulmonary embolus (PE) 04/22/2014   Hyperkalemia 07/26/2017   Hyperlipidemia    takes Pravastatin daily   Hypertension    Insomnia    Joint pain    Joint swelling    Morbid obesity (HCC)    Multiple benign nevi 11/15/2016   Myocardial infarction (HCC) 2004   Non-STEMI (non-ST elevated myocardial infarction) (HCC) 02/15/2020   Numbness    Obstructive sleep apnea    does not wear cpap   Osteoarthritis    Osteoarthritis    Osteoarthrosis, unspecified whether generalized or localized, lower leg    Pain in the chest 12/31/2013   Pain, chronic    Personal history of other diseases of the  digestive system 12/03/2006   Qualifier: Diagnosis of  By: Amador Cunas  MD, Janett Labella    Polymyalgia rheumatica [facility])    Presence of stent in right coronary artery    Pulmonary emboli (HCC) 01/2012   felt to need lifelong anticoagulation but Xarelto dc in 02/2020 due to need for DAPT   Situational depression 06/04/2009   Qualifier: Diagnosis of  By:  Amador Cunas  MD, Janett Labella    Syncope, vasovagal 07/04/2021   Type 2 diabetes mellitus with hyperlipidemia (HCC) 02/16/2022   Urinary incontinence    takes Linzess daily   Vaginitis and vulvovaginitis 06/23/2016   Vertigo    hx of;was taking Meclizine if needed   Wears dentures       Past Surgical History:  Procedure Laterality Date   ABDOMINAL HYSTERECTOMY     partial   APPENDECTOMY     blood clots/legs and lungs  2013   BREAST BIOPSY Left 07/22/2014   BREAST BIOPSY Left 02/10/2013   BREAST LUMPECTOMY Left 11/05/2014   BREAST LUMPECTOMY WITH RADIOACTIVE SEED LOCALIZATION Left 11/05/2014   Procedure: LEFT BREAST LUMPECTOMY WITH RADIOACTIVE SEED LOCALIZATION;  Surgeon: Abigail Miyamoto, MD;  Location: [facility] OR;  Service: General;  Laterality: Left;   CARDIAC CATHETERIZATION     COLONOSCOPY     CORONARY ANGIOPLASTY  2   CORONARY STENT INTERVENTION N/A 02/17/2020   Procedure: CORONARY STENT INTERVENTION;  Surgeon: Marykay Lex, MD;  Location: [facility] INVASIVE CV LAB;  Service: Cardiovascular;  Laterality: N/A;   ESOPHAGOGASTRODUODENOSCOPY (EGD) WITH PROPOFOL N/A 11/07/2016   Procedure: ESOPHAGOGASTRODUODENOSCOPY (EGD) WITH PROPOFOL;  Surgeon: Iva Boop, MD;  Location: [facility] ENDOSCOPY;  Service: Endoscopy;  Laterality: N/A;   EXCISION OF SKIN TAG Right 11/05/2014   Procedure: EXCISION OF RIGHT EYELID SKIN TAG;  Surgeon: Abigail Miyamoto, MD;  Location: [facility] OR;  Service: General;  Laterality: Right;   EYE SURGERY Bilateral    cataract    GASTRIC BYPASS  1977    reversed in 1979, [facility]   LEFT HEART CATH AND CORONARY ANGIOGRAPHY N/A 02/17/2020   Procedure: LEFT HEART CATH AND CORONARY ANGIOGRAPHY;  Surgeon: Marykay Lex, MD;  Location[name] INVASIVE CV LAB;  Service: Cardiovascular;  Laterality: N/A;   LEFT HEART CATHETERIZATION WITH CORONARY ANGIOGRAM N/A 06/29/2014   Procedure: LEFT HEART CATHETERIZATION WITH CORONARY ANGIOGRAM;  Surgeon: Lennette Bihari, MD;  Location: [facility] CATH LAB;   Service: Cardiovascular;  Laterality: N/A;   MEMBRANE PEEL Right 10/23/2018   Procedure: MEMBRANE PEEL;  Surgeon: Edmon Crape, MD;  Location: [facility] OR;  Service: Ophthalmology;  Laterality: Right;   MI with stent placement  2004   PARS PLANA VITRECTOMY Right 10/23/2018   Procedure: PARS PLANA VITRECTOMY WITH 25 GAUGE;  Surgeon: Edmon Crape, MD;  Location: [facility] OR;  Service: Ophthalmology;  Laterality: Right;    Social History:  Ambulatory  walker  wheelchair bound,      reports that [name] has never smoked. [name] has been exposed to tobacco smoke. [name] has never used smokeless tobacco. [name] reports that [name] does not drink alcohol and does not use drugs.   Family History:   Family History  Problem Relation Age of Onset   Breast cancer Mother 39   Heart disease Mother    Throat cancer Father    Hypertension Father    Arthritis Father    Diabetes Father    Arthritis Sister    Obesity Sister    Diabetes Sister    Heart disease  Cousin    Colon cancer Neg Hx    Stomach cancer Neg Hx    Esophageal cancer Neg Hx    ______________________________________________________________________________________________ Allergies: Allergies  Allergen Reactions   Sulfonamide Derivatives Swelling and Other (See Comments)    Mouth swelling- no resp issues noted   Duloxetine     Headaches   Lokelma [Sodium Zirconium Cyclosilicate] Other (See Comments)    Headache, stomach upset   Aricept [Donepezil Hcl] Diarrhea and Nausea And Vomiting    GI upset/loose stools   Tramadol Nausea And Vomiting    Prior to Admission medications   Medication Sig Start Date End Date Taking? Authorizing Provider  albuterol (PROAIR HFA) 108 (90 Base) MCG/ACT inhaler Inhale 1-2 puffs into the lungs every 6 (six) hours as needed for wheezing or shortness of breath. 12/19/18   Sharon Seller, NP  amoxicillin-clavulanate (AUGMENTIN) 875-125 MG tablet Take 1 tablet by mouth 2 (two) times daily. Patient not taking: Reported  on 01/02/2023 10/16/22   [provider]  anastrozole (ARIMIDEX) 1 MG tablet Take 1 tablet by mouth daily. 07/31/22   [provider]  apixaban (ELIQUIS) 5 MG TABS tablet Take 1 tablet (5 mg total) by mouth 2 (two) times daily. 04/05/22 03/31/23  [name], MD  atorvastatin (LIPITOR) 80 MG tablet TAKE 1 TABLET BY MOUTH EVERY DAY 12/04/22   [name], MD  benzonatate (TESSALON) 100 MG capsule Take 1 capsule (100 mg total) by mouth 2 (two) times daily as needed for cough. Patient not taking: Reported on 01/02/2023 09/05/22   [name], MD  carvedilol (COREG) 12.5 MG tablet TAKE 1 TABLET BY MOUTH 2 TIMES DAILY 12/05/22   [name], MD  cetirizine (ZYRTEC) 10 MG tablet Take 10 mg by mouth daily as needed for allergies or rhinitis.    [provider]  Continuous Glucose Sensor (FREESTYLE LIBRE 14 DAY SENSOR) MISC PLACE 1 DEVICE ON THE SKIN AS DIRECTED EVERY 14 DAYS 11/27/22   Shamleffer, Konrad Dolores, MD  diclofenac Sodium (VOLTAREN) 1 % GEL Apply 2 g topically 4 (four) times daily. Patient taking differently: Apply 2 g topically 4 (four) times daily as needed (pain). 02/23/22   [name], MD  docusate sodium (COLACE) 100 MG capsule Take 2 capsules (200 mg total) by mouth daily. 12/19/22 12/14/23  [name], MD  Elastic Bandages & Supports (B & B CARPAL TUNNEL BRACE) MISC Use brace on wrist as needed for wrist pain 04/24/22   [name], MD  febuxostat (ULORIC) 40 MG tablet Take 1 tablet (40 mg total) by mouth daily. 04/04/22 03/30/23  [name], MD  fluticasone (FLONASE) 50 MCG/ACT nasal spray Place 2 sprays into both nostrils daily for 14 days. 11/29/22 12/13/22  Salvatore Decent, FNP  folic acid (FOLVITE) 1 MG tablet TAKE 1 TABLET BY MOUTH EVERY DAY 12/05/22   [name], MD  glucagon 1 MG injection Inject 1 mg into the vein once as needed for up to 1 dose. 04/13/22   [name], MD  glucose 4 GM chewable tablet  Chew 1 tablet (4 g total) by mouth as needed for low blood sugar. 04/05/22   [name], MD  guaifenesin (ROBITUSSIN) 100 MG/5ML syrup Take 200 mg by mouth 3 (three) times daily as needed for cough. Patient not taking: Reported on 01/02/2023    [provider]  insulin glargine, 2 Unit Dial, (TOUJEO MAX SOLOSTAR) 300 UNIT/ML Solostar Pen Inject 40 Units  into the skin daily. Patient assistance program provides. Patient reports 40 units 04/24/2022 Patient taking differently: Inject 36 Units into the skin daily. Patient assistance program provides. Patient reports 40 units 04/24/2022 05/31/22   Shamleffer, Konrad Dolores, MD  linaclotide [facility]) 145 MCG CAPS capsule Patient take 1 capsule daily.  May add an additional capsule for breakthrough constipation associated with opioid use 12/19/22   [name], MD  meclizine (ANTIVERT) 25 MG tablet Take 1-2 tablets (25-50 mg total) by mouth 2 (two) times daily as needed for dizziness. 06/13/22   [name], MD  memantine (NAMENDA) 5 MG tablet TAKE 2 TABLETS BY MOUTH EVERY MORNING and TAKE 1 TABLET BY MOUTH EVERY EVENING 10/10/22   [name], MD  montelukast (SINGULAIR) 10 MG tablet Take 1 tablet (10 mg total) by mouth at bedtime. 04/24/22   [name], MD  nitroGLYCERIN (NITROSTAT) 0.4 MG SL tablet Take one tablet under the tongue every 5 minutes as needed for chest pain 09/24/13   Sharon Seller, NP  NOVOLOG FLEXPEN 100 UNIT/ML FlexPen Inject 14 Units into the skin 2 (two) times daily with a meal. Max daily dose 70 units 04/03/22   Shamleffer, Konrad Dolores, MD  nystatin cream (MYCOSTATIN) Apply 1 Application topically 2 (two) times daily. 02/16/22   Reed, Tiffany L, DO  ondansetron (ZOFRAN) 4 MG tablet Take 1 tablet (4 mg total) by mouth every 8 (eight) hours as needed for nausea or vomiting. 03/14/22   [name], MD  pantoprazole (PROTONIX) 20 MG tablet TAKE 1 TABLET BY MOUTH EVERY DAY 10/23/22   [name], MD  Semaglutide,0.25 or 0.5MG /DOS, (OZEMPIC, 0.25 OR 0.5 MG/DOSE,) 2 MG/3ML SOPN Inject 0.5 mg into the skin once a week. 08/21/22   Shamleffer, Konrad Dolores, MD  sodium polystyrene (SPS) 15 GM/60ML suspension TAKE 60 MLS BY MOUTH ONCE WEEKLY TO KEEP POTASSIUM DOWN 07/21/22   [name], MD  SYMBICORT 160-4.5 MCG/ACT inhaler Inhale 2 puffs by mouth twice daily 05/12/22   Sharon Seller, NP  torsemide (DEMADEX) 20 MG tablet TAKE 1 TABLET BY MOUTH EVERY DAY TAKE AN EXTRA DOES FOR WEIGHT GAIN, 3LBs IN 1 DAY OR 5LBS IN 1 WEEK 01/02/23   [name], MD  traZODone (DESYREL) 50 MG tablet Take 25 mg by mouth at bedtime.    [provider]  Vitamin D, Ergocalciferol, (DRISDOL) 1.25 MG (50000 UNIT) CAPS capsule TAKE 1 CAPSULE BY MOUTH ONCE WEEKLY 01/02/23   [name], MD    ___________________________________________________________________________________________________ Physical Exam:    01/03/2023    7:20 PM 01/03/2023    7:00 PM 01/03/2023    6:30 PM  Vitals with BMI  Systolic 127 108 161  Diastolic 47 39 37  Pulse 68 65 67     1. General:  in No  Acute distress   Chronically ill   -appearing 2. Psychological: Alert and   Oriented 3. Head/ENT:   Moist   Mucous Membranes                          Head Non traumatic, neck supple                           Poor Dentition 4. SKIN: normal  Skin turgor,  Skin clean Dry and intact no rash    5. Heart: Regular rate and rhythm no  Murmur, no Rub  or gallop 6. Lungs: , no wheezes or crackles   7. Abdomen: Soft,  epigastric -tender, LUQ tenderness and firmness Non distended   obese  bowel sounds present 8. Lower extremities: no clubbing, cyanosis, no  edema 9. Neurologically Grossly intact, moving all 4 extremities equally   10. MSK: Normal range of motion    Chart has been reviewed  ______________________________________________________________________________________________  Assessment/Plan 88 y.o.  female with medical history significant of HTN HLD diastolic CHF COPD esophagitis DM2 CKD stage IV, breast cancer   Admitted for upper GI bleed  Present on Admission:  Upper GI bleed  Essential hypertension  Chronic diastolic CHF (congestive heart failure) (HCC)  Type 2 diabetes mellitus with hyperlipidemia (HCC)  Dementia (HCC)  COPD with chronic bronchitis  Breast cancer (HCC)  Hyperlipidemia  CKD (chronic kidney disease), stage IV (HCC)  Symptomatic anemia  CAD (coronary artery disease)  Class 3 obesity (HCC)  Epigastric abdominal tenderness     Essential hypertension Allow permissive hypertension for tonight  Chronic diastolic CHF (congestive heart failure) (HCC) Chronic stable monitor fluid status this patient is going to get blood products  Type 2 diabetes mellitus with hyperlipidemia (HCC) Order sliding scale continue insulin but decrease down to 30 units given at patient n.p.o.  Dementia (HCC) Chronic stable monitor for any sign of decompensation  COPD with chronic bronchitis Continue any home medications currently noncontributory  Breast cancer (HCC) Followed up with patient by Dr. Candise Che  continue to hold Arimidex  Hyperlipidemia Chronic stable continue Lipitor 80 mg a day  CKD (chronic kidney disease), stage IV (HCC)  -chronic avoid nephrotoxic medications such as NSAIDs, Vanco Zosyn combo,  avoid hypotension, continue to follow renal function   Upper GI bleed  - Glasgow Blatchford score BUN >18.2   Hg  33F   elena   CHF*.  >1 Justifies admission and aggressive management      Modifying risk factors include:  hx of PUD    anticoagulation,           Admit to stepdown given above    -  ER  Provider spoke to gastroenterology  LB) they will see patient in a.m. appreciate their consult   - serial CBC.    - Monitor for any recurrence,  evidence of hemodynamic instability or significant blood loss  - Transfuse as needed for hemoglobin below 7 or evidence of  life-threatening bleeding  - Establish at least 2 PIV and fluid resuscitate   - clear liquids for tonight keep nothing by mouth post midnight,   -  administer Protonix  drip     Symptomatic anemia Transfuse 1 unit for right now repeat serial CBC and monitor continue transfuse as needed  CAD (coronary artery disease) Continue statin Lipitor 80 mg a day resume Coreg when able to tolerate  Class 3 obesity (HCC) Follow-up in the outpatient nutritional clinic  History of DVT (deep vein thrombosis) Hold Eliquis  Epigastric abdominal tenderness Noted abd tenderness and possible firmness, CT abd ordered non contrasted given CKD  Long term current use of anticoagulant therapy Hold eliquis   Other plan as per orders.  DVT prophylaxis:  SCD     Code Status:    DNR/DNI   as per patient  family  I had personally discussed CODE STATUS with patient and family  ACP  has been reviewed     Family Communication:   Family  at  Bedside  plan of care was discussed     with  Daughter,   Diet clear now and NPO post midnight   Disposition Plan:        To home once workup is complete and patient is stable   Following barriers for discharge:                             Anemia corrected h/H stable                            GI workup complete                           Will need consultants to evaluate patient prior to discharge       Consult Orders  (From admission, onward)           Start     Ordered   01/03/23 1919  Consult to hospitalist  Once       Provider:  (Not yet assigned)  Question Answer Comment  Place call to: [facility]   Reason for Consult Admit      01/03/23 1918                               Would benefit from PT/OT eval prior to DC  Ordered                     Consults called:  LB GI   Admission status:  ED Disposition     ED Disposition  Admit   Condition  --   Comment  Hospital Area: [facility] [[id number]]  Level of  Care: Progressive [102]  Admit to Progressive based on following criteria: GI, ENDOCRINE disease patients with GI bleeding, acute liver failure or pancreatitis, stable with diabetic ketoacidosis or thyrotoxicosis (hypothyroid) state.  May admit patient to [facility] or [facility] if equivalent level of care is available:: No  Covid Evaluation: Asymptomatic - no recent exposure (last 10 days) testing not required  Diagnosis: Upper GI bleed [401027]  Admitting Physician: Therisa Doyne [[id number]]  Attending Physician: Therisa Doyne [[id number]]  Certification:: I certify this patient will need inpatient services for at least 2 midnights  Expected Medical Readiness: 01/05/2023           inpatient     I Expect 2 midnight stay secondary to severity of patient's current illness need for inpatient interventions justified by the following:    Severe lab/radiological/exam abnormalities including:   Anemia and extensive comorbidities including:    DM2   CHF     COPD/asthma  CKD   malignancy,   Chronic anticoagulation  That are currently affecting medical management.   I expect  patient to be hospitalized for 2 midnights requiring inpatient medical care.  Patient is at high risk for adverse outcome (such as loss of life or disability) if not treated.  Indication for inpatient stay as follows:    Need for operative/procedural  intervention     Need for , IV fluids, IV PPI   Level of care       progressive     stepdown   tele indefinitely please discontinue once patient no longer qualifies COVID-19 Labs   Kashauna Celmer 01/03/2023, 8:47 PM    [facility]     after 2 AM please page floor coverage PA If  7AM-7PM, please contact the day team taking care of the patient using [email or web address]

## 2023-01-03 NOTE — ED Triage Notes (Signed)
Pt coming in from cancer center complaining of rectal bleeding.

## 2023-01-03 NOTE — ED Notes (Signed)
Patient refuses 2nd IV at this time.

## 2023-01-03 NOTE — Assessment & Plan Note (Signed)
-   Glasgow Blatchford score BUN >18.2   Hg  4F   [name]   CHF*.  >1 Justifies admission and aggressive management      Modifying risk factors include:  hx of PUD    anticoagulation,           Admit to stepdown given above    -  ER  Provider spoke to gastroenterology  LB) they will see patient in a.m. appreciate their consult   - serial CBC.    - Monitor for any recurrence,  evidence of hemodynamic instability or significant blood loss  - Transfuse as needed for hemoglobin below 7 or evidence of life-threatening bleeding  - Establish at least 2 PIV and fluid resuscitate   - clear liquids for tonight keep nothing by mouth post midnight,   -  administer Protonix  drip

## 2023-01-03 NOTE — Assessment & Plan Note (Signed)
Noted abd tenderness and possible firmness, CT abd ordered non contrasted given CKD

## 2023-01-03 NOTE — Progress Notes (Signed)
Hematology/Oncology Clinic Followup Note  Date of service 01/03/2023    Patient Care Team: [name], MD as PCP - General (Family Medicine) Daleen Squibb, Jesse Sans, MD (Inactive) as Consulting Physician (Cardiology) Ernesto Rutherford, MD as Consulting Physician (Ophthalmology) Abigail Miyamoto, MD as Consulting Physician (General Surgery) Miguel Aschoff, MD (Inactive) as Referring Physician (Obstetrics and Gynecology) Luciana Axe Alford Highland, MD as Consulting Physician (Ophthalmology) [facility], Konrad Dolores, MD as Consulting Physician (Endocrinology) [name], MD as Consulting Physician (Hematology) Drema Dallas, DO as Consulting Physician (Neurology) Audrie Gallus, RN as [facility] Care Management Meriam Sprague, MD as Consulting Physician (Cardiology)  CHIEF COMPLAINTS/PURPOSE OF CONSULTATION:  Follow-up for continued evaluation and management of breast cancer  DIAGNOSIS: left-sided presumed stage IA  (pT1c,pNx[cN0], cM0) invasive ductal carcinoma grade 2 out of 3, strongly ER +100%, strongly PR +100% and HER-2/neu negative. Given her high risk cardiac status lumpectomy had to be done under local anesthesia and sentinel lymph node biopsy was not possible. She has accompanying DCIS of intermediate grade present at margins. ECOG performance status is 3. -Had a mammogram/tomosynthesis done on 07/29/2015 which shows residual suspicious calcification in the left breast postero-medial to the biopsy site. Has known residual DCIS.  No evidence of new malignancy.  No evidence of right breast malignancy.   CURRENT TREATMENT: Arimidex 1mg  po daily  HISTORY OF PRESENTING ILLNESS: -please see my initial consultation for details of initial presentation   INTERVAL HISTORY:  [name] is a 88 y.o. female here for follow-up of her breast cancer on endocrine therapy with Arimidex for continued evaluation and management.  Patient was last seen by me on 07/11/2022 and  complained of increased anxiety causing insomnia and occasional upper abdominal pain.   She was hospitalized on 12/29/2022 for chest pain and symptomatic anemia. CT scan showed no acute findings.   Today, she presents in a wheelchair. She is accompanied by two family members. Patient denies having any chest pain during the time of her hospitalization, but rather endorsed abdominal soreness. She reports that food intake does not affect symptoms.  She denies any nose bleeds, gum, or blood in stools. Patient did endorse red blood in the urine. Patient has endorsed black stools for at least two weeks. She is not taking oral iron at this time due to constipation. She is not taking any Pepto-Bismol at this time. She does manage chronic constipation with Linzess.  Patient reports that she has not been taking Arimidex since her last clinical visit.   Patient reports endorsing a cough for the last two weeks. She complains of left sided-shoulder and neck pain.   MEDICAL HISTORY:  Past Medical History:  Diagnosis Date   Abdominal pain 01/26/2012   Acute deep vein thrombosis (DVT) of femoral vein of right lower extremity (HCC) 03/26/2022   Acute kidney injury superimposed on chronic kidney disease (HCC) 12/18/2014   Acute upper GI bleed 03/26/2022   Allergy    takes Mucinex daily as needed   Anemia, unspecified    Anxiety    takes Clonazepam daily as needed   Anxiety associated with depression 06/08/2017   Arthritis    Breast cancer (HCC)    Breast cancer screening 02/19/2014   Breast mass 10/27/2014   Chronic diastolic CHF (congestive heart failure) (HCC)    takes Furosemide daily   Chronic pain syndrome 02/06/2015   CKD (chronic kidney disease), stage III (HCC)    Coronary artery disease    a. s/p  IMI 2004 tx with BMS to RCA;  b. s/p Promus DES to RCA 2/12 c. abnormal nuc 2016 -> cath with med rx. d. NSTEMI 02/2020  mv CAD with severe ISR in pRCA and m/dRCA lesion both treated with DES.    Debility 08/01/2012   Dementia (HCC)    Depression    takes Cymbalta daily   Diabetes mellitus    insulin daily   DOE (dyspnea on exertion) 06/24/2014   Dysphagia, unspecified(787.20) 07/31/2012   Fungal dermatitis 11/13/2007   Qualifier: Diagnosis of  By: Amador Cunas  MD, Janett Labella    GERD (gastroesophageal reflux disease)    takes Protonix daily   Gout, unspecified    Headache    occasionally   History of anemia due to chronic kidney disease 03/26/2022   History of blood clots    History of deep venous thrombosis 01/27/2012   History of pulmonary embolus (PE) 04/22/2014   Hyperkalemia 07/26/2017   Hyperlipidemia    takes Pravastatin daily   Hypertension    Insomnia    Joint pain    Joint swelling    Morbid obesity (HCC)    Multiple benign nevi 11/15/2016   Myocardial infarction (HCC) 2004   Non-STEMI (non-ST elevated myocardial infarction) (HCC) 02/15/2020   Numbness    Obstructive sleep apnea    does not wear cpap   Osteoarthritis    Osteoarthritis    Osteoarthrosis, unspecified whether generalized or localized, lower leg    Pain in the chest 12/31/2013   Pain, chronic    Personal history of other diseases of the digestive system 12/03/2006   Qualifier: Diagnosis of  By: Amador Cunas  MD, Janett Labella    Polymyalgia rheumatica [facility])    Presence of stent in right coronary artery    Pulmonary emboli (HCC) 01/2012   felt to need lifelong anticoagulation but Xarelto dc in 02/2020 due to need for DAPT   Situational depression 06/04/2009   Qualifier: Diagnosis of  By: Amador Cunas  MD, Janett Labella    Syncope, vasovagal 07/04/2021   Type 2 diabetes mellitus with hyperlipidemia (HCC) 02/16/2022   Urinary incontinence    takes Linzess daily   Vaginitis and vulvovaginitis 06/23/2016   Vertigo    hx of;was taking Meclizine if needed   Wears dentures     SURGICAL HISTORY: Past Surgical History:  Procedure Laterality Date   ABDOMINAL HYSTERECTOMY     partial   APPENDECTOMY      blood clots/legs and lungs  2013   BREAST BIOPSY Left 07/22/2014   BREAST BIOPSY Left 02/10/2013   BREAST LUMPECTOMY Left 11/05/2014   BREAST LUMPECTOMY WITH RADIOACTIVE SEED LOCALIZATION Left 11/05/2014   Procedure: LEFT BREAST LUMPECTOMY WITH RADIOACTIVE SEED LOCALIZATION;  Surgeon: Abigail Miyamoto, MD;  Location: [facility] OR;  Service: General;  Laterality: Left;   CARDIAC CATHETERIZATION     COLONOSCOPY     CORONARY ANGIOPLASTY  2   CORONARY STENT INTERVENTION N/A 02/17/2020   Procedure: CORONARY STENT INTERVENTION;  Surgeon: Marykay Lex, MD;  Location: [facility] INVASIVE CV LAB;  Service: Cardiovascular;  Laterality: N/A;   ESOPHAGOGASTRODUODENOSCOPY (EGD) WITH PROPOFOL N/A 11/07/2016   Procedure: ESOPHAGOGASTRODUODENOSCOPY (EGD) WITH PROPOFOL;  Surgeon: Iva Boop, MD;  Location: [facility] ENDOSCOPY;  Service: Endoscopy;  Laterality: N/A;   EXCISION OF SKIN TAG Right 11/05/2014   Procedure: EXCISION OF RIGHT EYELID SKIN TAG;  Surgeon: Abigail Miyamoto, MD;  Location: [facility] OR;  Service: General;  Laterality: Right;   EYE SURGERY Bilateral  cataract    GASTRIC BYPASS  1977    reversed in 1979, [facility]   LEFT HEART CATH AND CORONARY ANGIOGRAPHY N/A 02/17/2020   Procedure: LEFT HEART CATH AND CORONARY ANGIOGRAPHY;  Surgeon: Marykay Lex, MD;  Location: [facility] INVASIVE CV LAB;  Service: Cardiovascular;  Laterality: N/A;   LEFT HEART CATHETERIZATION WITH CORONARY ANGIOGRAM N/A 06/29/2014   Procedure: LEFT HEART CATHETERIZATION WITH CORONARY ANGIOGRAM;  Surgeon: Lennette Bihari, MD;  Location: [facility] CATH LAB;  Service: Cardiovascular;  Laterality: N/A;   MEMBRANE PEEL Right 10/23/2018   Procedure: MEMBRANE PEEL;  Surgeon: Edmon Crape, MD;  Location: [facility] OR;  Service: Ophthalmology;  Laterality: Right;   MI with stent placement  2004   PARS PLANA VITRECTOMY Right 10/23/2018   Procedure: PARS PLANA VITRECTOMY WITH 25 GAUGE;  Surgeon: Edmon Crape, MD;  Location: [facility] OR;  Service: Ophthalmology;   Laterality: Right;    SOCIAL HISTORY: Social History   Socioeconomic History   Marital status: Widowed    Spouse name: Not on file   Number of children: 3   Years of education: Not on file   Highest education level: High school graduate  Occupational History   Occupation: retired  Tobacco Use   Smoking status: Never    Passive exposure: Past   Smokeless tobacco: Never   Tobacco comments:    Both parents smoked, patient was exposed/ "Raised up in smoke, my whole life."  Vaping Use   Vaping status: Never Used  Substance and Sexual Activity   Alcohol use: No    Alcohol/week: 0.0 standard drinks of alcohol   Drug use: No   Sexual activity: Not on file  Other Topics Concern   Not on file  Social History Narrative   Not on file   Social Determinants of Health   Financial Resource Strain: Low Risk  (06/05/2022)   Overall Financial Resource Strain (CARDIA)    Difficulty of Paying Living Expenses: Not hard at all  Food Insecurity: No Food Insecurity (12/06/2022)   Hunger Vital Sign    Worried About Running Out of Food in the Last Year: Never true    Ran Out of Food in the Last Year: Never true  Transportation Needs: No Transportation Needs (12/06/2022)   PRAPARE - Administrator, Civil Service (Medical): No    Lack of Transportation (Non-Medical): No  Physical Activity: Insufficiently Active (06/05/2022)   Exercise Vital Sign    Days of Exercise per Week: 3 days    Minutes of Exercise per Session: 10 min  Stress: Stress Concern Present (06/05/2022)   [employer] of Occupational Health - Occupational Stress Questionnaire    Feeling of Stress : Rather much  Social Connections: Moderately Isolated (04/20/2022)   Social Connection and Isolation Panel [NHANES]    Frequency of Communication with Friends and Family: Once a week    Frequency of Social Gatherings with Friends and Family: Once a week    Attends Religious Services: 1 to 4 times per year    Active  Member of [employer] or Organizations: Yes    Attends Banker Meetings: 1 to 4 times per year    Marital Status: Widowed  Intimate Partner Violence: Not At Risk (04/20/2022)   Humiliation, Afraid, Rape, and Kick questionnaire    Fear of Current or Ex-Partner: No    Emotionally Abused: No    Physically Abused: No    Sexually Abused: No    FAMILY HISTORY: Family History  Problem Relation Age of Onset   Breast cancer Mother 40   Heart disease Mother    Throat cancer Father    Hypertension Father    Arthritis Father    Diabetes Father    Arthritis Sister    Obesity Sister    Diabetes Sister    Heart disease Cousin    Colon cancer Neg Hx    Stomach cancer Neg Hx    Esophageal cancer Neg Hx     ALLERGIES:  is allergic to sulfonamide derivatives, duloxetine, lokelma [sodium zirconium cyclosilicate], aricept [donepezil hcl], and tramadol.  MEDICATIONS:  Current Outpatient Medications  Medication Sig Dispense Refill   albuterol (PROAIR HFA) 108 (90 Base) MCG/ACT inhaler Inhale 1-2 puffs into the lungs every 6 (six) hours as needed for wheezing or shortness of breath. 6.7 g 1   amoxicillin-clavulanate (AUGMENTIN) 875-125 MG tablet Take 1 tablet by mouth 2 (two) times daily. (Patient not taking: Reported on 01/02/2023)     anastrozole (ARIMIDEX) 1 MG tablet Take 1 tablet by mouth daily.     apixaban (ELIQUIS) 5 MG TABS tablet Take 1 tablet (5 mg total) by mouth 2 (two) times daily. 180 tablet 3   atorvastatin (LIPITOR) 80 MG tablet TAKE 1 TABLET BY MOUTH EVERY DAY 90 tablet 3   benzonatate (TESSALON) 100 MG capsule Take 1 capsule (100 mg total) by mouth 2 (two) times daily as needed for cough. (Patient not taking: Reported on 01/02/2023) 20 capsule 0   carvedilol (COREG) 12.5 MG tablet TAKE 1 TABLET BY MOUTH 2 TIMES DAILY 180 tablet 1   cetirizine (ZYRTEC) 10 MG tablet Take 10 mg by mouth daily as needed for allergies or rhinitis.     Continuous Glucose Sensor (FREESTYLE LIBRE 14  DAY SENSOR) MISC PLACE 1 DEVICE ON THE SKIN AS DIRECTED EVERY 14 DAYS 6 each 2   diclofenac Sodium (VOLTAREN) 1 % GEL Apply 2 g topically 4 (four) times daily. (Patient taking differently: Apply 2 g topically 4 (four) times daily as needed (pain).) 150 g 2   docusate sodium (COLACE) 100 MG capsule Take 2 capsules (200 mg total) by mouth daily. 180 capsule 3   Elastic Bandages & Supports (B & B CARPAL TUNNEL BRACE) MISC Use brace on wrist as needed for wrist pain 2 each 10   febuxostat (ULORIC) 40 MG tablet Take 1 tablet (40 mg total) by mouth daily. 90 tablet 3   fluticasone (FLONASE) 50 MCG/ACT nasal spray Place 2 sprays into both nostrils daily for 14 days. 1 g 0   folic acid (FOLVITE) 1 MG tablet TAKE 1 TABLET BY MOUTH EVERY DAY 90 tablet 1   glucagon 1 MG injection Inject 1 mg into the vein once as needed for up to 1 dose. 1 each 12   glucose 4 GM chewable tablet Chew 1 tablet (4 g total) by mouth as needed for low blood sugar. 50 tablet 12   guaifenesin (ROBITUSSIN) 100 MG/5ML syrup Take 200 mg by mouth 3 (three) times daily as needed for cough. (Patient not taking: Reported on 01/02/2023)     insulin glargine, 2 Unit Dial, (TOUJEO MAX SOLOSTAR) 300 UNIT/ML Solostar Pen Inject 40 Units into the skin daily. Patient assistance program provides. Patient reports 40 units 04/24/2022 (Patient taking differently: Inject 36 Units into the skin daily. Patient assistance program provides. Patient reports 40 units 04/24/2022) 12 mL 1   linaclotide (LINZESS) 145 MCG CAPS capsule Patient take 1 capsule daily.  May add an additional capsule  for breakthrough constipation associated with opioid use 180 capsule 1   meclizine (ANTIVERT) 25 MG tablet Take 1-2 tablets (25-50 mg total) by mouth 2 (two) times daily as needed for dizziness. 90 tablet 0   memantine (NAMENDA) 5 MG tablet TAKE 2 TABLETS BY MOUTH EVERY MORNING and TAKE 1 TABLET BY MOUTH EVERY EVENING 270 tablet 5   montelukast (SINGULAIR) 10 MG tablet Take 1  tablet (10 mg total) by mouth at bedtime. 30 tablet 3   nitroGLYCERIN (NITROSTAT) 0.4 MG SL tablet Take one tablet under the tongue every 5 minutes as needed for chest pain 50 tablet 0   NOVOLOG FLEXPEN 100 UNIT/ML FlexPen Inject 14 Units into the skin 2 (two) times daily with a meal. Max daily dose 70 units 60 mL 0   nystatin cream (MYCOSTATIN) Apply 1 Application topically 2 (two) times daily. 30 g 3   ondansetron (ZOFRAN) 4 MG tablet Take 1 tablet (4 mg total) by mouth every 8 (eight) hours as needed for nausea or vomiting. 20 tablet 0   pantoprazole (PROTONIX) 20 MG tablet TAKE 1 TABLET BY MOUTH EVERY DAY 30 tablet 5   Semaglutide,0.25 or 0.5MG /DOS, (OZEMPIC, 0.25 OR 0.5 MG/DOSE,) 2 MG/3ML SOPN Inject 0.5 mg into the skin once a week. 3 mL 11   sodium polystyrene (SPS) 15 GM/60ML suspension TAKE 60 MLS BY MOUTH ONCE WEEKLY TO KEEP POTASSIUM DOWN 240 mL 0   SYMBICORT 160-4.5 MCG/ACT inhaler Inhale 2 puffs by mouth twice daily 11 g 0   torsemide (DEMADEX) 20 MG tablet TAKE 1 TABLET BY MOUTH EVERY DAY TAKE AN EXTRA DOES FOR WEIGHT GAIN, 3LBs IN 1 DAY OR 5LBS IN 1 WEEK 30 tablet 0   traZODone (DESYREL) 50 MG tablet Take 25 mg by mouth at bedtime.     Vitamin D, Ergocalciferol, (DRISDOL) 1.25 MG (50000 UNIT) CAPS capsule TAKE 1 CAPSULE BY MOUTH ONCE WEEKLY 4 capsule 1   No current facility-administered medications for this visit.    REVIEW OF SYSTEMS:    10 Point review of Systems was done is negative except as noted above.   PHYSICAL EXAMINATION:  ECOG PERFORMANCE STATUS:3  Vitals:   01/03/23 1437  BP: (!) 111/43  Pulse: 68  Resp: 18  Temp: 97.7 F (36.5 C)  SpO2: 100%     There were no vitals filed for this visit.   GENERAL:alert, in no acute distress and comfortable SKIN: no acute rashes, no significant lesions EYES: conjunctiva are pink and non-injected, sclera anicteric OROPHARYNX: MMM, no exudates, no oropharyngeal erythema or ulceration NECK: supple, no JVD LYMPH:  no  palpable lymphadenopathy in the cervical, axillary or inguinal regions LUNGS: clear to auscultation b/l with normal respiratory effort HEART: regular rate & rhythm ABDOMEN:  normoactive bowel sounds , non tender, not distended. Extremity: no pedal edema PSYCH: alert & oriented x 3 with fluent speech NEURO: no focal motor/sensory deficits   LABORATORY DATA:  I have reviewed the data as listed  .    Latest Ref Rng & Units 01/03/2023    4:35 PM 01/03/2023    2:15 PM 12/29/2022   12:39 PM  CBC  WBC 4.0 - 10.5 K/uL 7.6  6.4  12.4   Hemoglobin 12.0 - 15.0 g/dL 7.0  7.1  7.6   Hematocrit 36.0 - 46.0 % 23.4  23.1  25.3   Platelets 150 - 400 K/uL 213  216  246    . CBC    Component Value Date/Time   WBC  12.4 (H) 12/29/2022 1239   RBC 2.75 (L) 12/29/2022 1239   HGB 7.6 (L) 12/29/2022 1239   HGB 8.7 (L) 07/11/2022 1253   HGB 9.3 (L) 03/02/2020 1517   HGB 8.7 (L) 03/21/2017 1351   HCT 25.3 (L) 12/29/2022 1239   HCT 29.5 (L) 03/02/2020 1517   HCT 28.9 (L) 03/21/2017 1351   PLT 246 12/29/2022 1239   PLT 251 07/11/2022 1253   PLT 254 03/02/2020 1517   MCV 92.0 12/29/2022 1239   MCV 88 03/02/2020 1517   MCV 84.3 03/21/2017 1351   MCH 27.6 12/29/2022 1239   MCHC 30.0 12/29/2022 1239   RDW 14.6 12/29/2022 1239   RDW 13.1 03/02/2020 1517   RDW 15.5 (H) 03/21/2017 1351   LYMPHSABS 2.5 07/11/2022 1253   LYMPHSABS 2.4 03/21/2017 1351   MONOABS 0.7 07/11/2022 1253   MONOABS 0.7 03/21/2017 1351   EOSABS 0.4 07/11/2022 1253   EOSABS 0.2 03/21/2017 1351   BASOSABS 0.0 07/11/2022 1253   BASOSABS 0.0 03/21/2017 1351   .    Latest Ref Rng & Units 01/03/2023    4:35 PM 01/03/2023    2:15 PM 12/29/2022   12:39 PM  CMP  Glucose 70 - 99 mg/dL 960  454  098   BUN 8 - 23 mg/dL 75  77  119   Creatinine 0.44 - 1.00 mg/dL 1.47  8.29  5.62   Sodium 135 - 145 mmol/L 137  138  137   Potassium 3.5 - 5.1 mmol/L 4.6  4.3  4.2   Chloride 98 - 111 mmol/L 103  105  100   CO2 22 - 32 mmol/L 25  29  21     Calcium 8.9 - 10.3 mg/dL 8.7  8.5  8.5   Total Protein 6.5 - 8.1 g/dL 6.7  6.8  6.9   Total Bilirubin 0.3 - 1.2 mg/dL 0.4  0.3  0.4   Alkaline Phos 38 - 126 U/L 69  73  82   AST 15 - 41 U/L 22  20  16    ALT 0 - 44 U/L 16  12  12      . Lab Results  Component Value Date   IRON 27 (L) 07/11/2022   TIBC 239 (L) 07/11/2022   IRONPCTSAT 11 07/11/2022   (Iron and TIBC)  Lab Results  Component Value Date   FERRITIN 281 07/11/2022       RADIOGRAPHIC STUDIES: I have personally reviewed the radiological images as listed and agreed with the findings in the report.  CT Chest Wo Contrast  Result Date: 12/29/2022 CLINICAL DATA:  Chest pain, abnormal chest x-ray EXAM: CT CHEST WITHOUT CONTRAST TECHNIQUE: Multidetector CT imaging of the chest was performed following the standard protocol without IV contrast. RADIATION DOSE REDUCTION: This exam was performed according to the departmental dose-optimization program which includes automated exposure control, adjustment of the mA and/or kV according to patient size and/or use of iterative reconstruction technique. COMPARISON:  Chest x-ray dated December 29, 2022; CT chest, abdomen and pelvis dated February 08, 2022; chest CT dated July 04, 2021 FINDINGS: Cardiovascular: Normal heart size. No pericardial effusion. Normal caliber thoracic aorta with moderate atherosclerotic disease. Severe coronary artery calcifications. Mediastinum/Nodes: Small hiatal hernia. Thyroid is unremarkable. No enlarged lymph nodes seen in the chest. Lungs/Pleura: Central airways are patent. No consolidation, pleural effusion or pneumothorax. No reticular opacities, traction bronchiectasis or honeycomb change. Small solid nodule of the left upper lobe measuring 4 mm on series 4, image 138, no  follow-up imaging is necessary given greater than 1 year stability. Upper Abdomen: Gallstones. Low-attenuation right renal lesions which are likely simple cysts. Musculoskeletal: No chest  wall mass or suspicious bone lesions identified. IMPRESSION: 1. No acute findings in the chest or evidence interstitial lung disease. 2. Coronary artery calcifications aortic Atherosclerosis (ICD10-I70.0). Electronically Signed   By: Allegra Lai M.D.   On: 12/29/2022 17:03   DG Chest Port 1 View  Result Date: 12/29/2022 CLINICAL DATA:  Chest pain. EXAM: PORTABLE CHEST 1 VIEW COMPARISON:  Chest x-ray dated July 12, 2022. FINDINGS: The heart size and mediastinal contours are within normal limits. Mildly increased ill-defined interstitial opacities, worst at the lung bases. No focal consolidation, pleural effusion, or pneumothorax. No acute osseous abnormality. IMPRESSION: 1. Increased ill-defined interstitial opacities, worst at the lung bases, concerning for atypical infection or mild interstitial pulmonary edema. Electronically Signed   By: Obie Dredge M.D.   On: 12/29/2022 13:44   Diagnostic mammogram b/l 11/01/17  IMPRESSION: No significant change in suspicious microcalcifications posterior to the lumpectomy site in the left breast compared to the mammogram of 2018. No suspicious findings in the right breast. Exclusion of some posterior tissue due to patient decreased mobility.   RECOMMENDATION: Diagnostic mammogram is suggested in 1 year. (Code:[id number])  CLINICAL DATA:  Annual mammography. The patient had surgery for DCIS in the left breast in 2016. There were positive margins at surgery but the patient could not return for additional surgery. Known suspicious calcifications remain. EXAM: 2D DIGITAL DIAGNOSTIC BILATERAL MAMMOGRAM WITH CAD AND ADJUNCT TOMO COMPARISON:  Previous exam(s). ACR Breast Density Category b: There are scattered areas of fibroglandular density. FINDINGS: The suspicious calcifications posterior to the left lumpectomy site are stable. No other interval changes or other suspicious findings. Mammographic images were processed with  CAD. IMPRESSION: Continued suspicious calcifications posterior to the left lumpectomy site. No other changes. RECOMMENDATION: Continued annual mammography for surveillance. Continued oncologic follow up. I have discussed the findings and recommendations with the patient. Results were also provided in writing at the conclusion of the visit. If applicable, a reminder letter will be sent to the patient regarding the next appointment. BI-RADS CATEGORY  6: Known biopsy-proven malignancy.     Electronically Signed   By: Gerome Sam III M.D   On: 08/16/2016 15:21    ASSESSMENT & PLAN:   [name] is a very wonderful 87 y.o. African-American female with multiple medical comorbidities as described above with   #1 Left-sided presumed stage IA  (pT1c,pNx[cN0], cM0) invasive ductal carcinoma grade 2 out of 3, strongly ER +100%, strongly PR +100% and HER-2/neu negative. Given her high risk cardiac status lumpectomy had to be done under local anesthesia and sentinel lymph node biopsy was not possible. She has accompanying DCIS of intermediate grade present at margins. ECOG performance status is 3. Dexa scan "low bone mass" per WHO. -Bone density scan on 03/12/2017 with results showing: T-score of 0.5 at left forearm. Lumbar spine and dual femurs not completed due to patient in a wheelchair and not able to lay flat due to SOB while laying flat.   #2 significant coronary artery disease with stress test in February 2016 suggesting likely multivessel disease. Has uncontrolled diabetes, hypertension, dyslipidemia, untreated sleep apnea, coronary disease, severe arthritis all of which are significantly limiting her quality of life.  #3 vitamin D deficiency - 25OH vitamin D level of 10, s/p high dose ergocalciferol re-placement with 25OH Vit D improvement to 30.9 in 10/2017. Now on vit  D 2000 units daily.   #4  Anemia- anemia of chronic disease from CKD + ? Slow GI losses . Multiple metabolic insults  in the bone marrow including uncontrolled diabetes  Now with more reapid drop due to concerns for active GI melena and hematuria.  PLAN:  -Discussed lab results on 01/03/23 in detail with patient. CBC showed WBC of 6.4K, hemoglobin of 7.1, and platelets of 216K. -patient is anemic at this time and symptomatic from this -hemoglobin was previously 9.9 on 09/12/2022 and 7.6 during the time of her hospitalization on 12/29/2022 -anemia is likely related to worsening kidney failure from HTN and DM -creatine level has worsened to 2.8  -mammogram on 07/07/2022 showed no mammographic evidence of malignancy  -Should has no clinical or mammographic evidence of breast cancer progression at this time. -POC occult blood on 12/29/2022 showed negative findings, which may not be accurate -black stools are concerning for GI bleeding -discussed that blood thinner dose may need to be adjusted by PCP based on her bleeding risk and renal function -discussed potential proceeding options such as stopping blood thinners, administering a blood transfusion as an outpatient in the next few days, and IV iron -recommend patient to present to the ED to be evaluated for anemia, receive a unit blood transfusions, address blood thinners, receive IV iron, ensure that blood counts are stable, and patient is not actively bleeding and for GI consultation -discussed potential risks associated with stopping blood thinners such as blood clot or stroke -patient's endorses chest wall pain from costochondriitis -discussed that if a primary cancer is removed from the breast, to prevent any new cancer, there may be a role to continue Arimidex for 5-7 years. In patient's case, her primary cancer could not be removed due to her heart issues.  -patient has not been taking Arimidex since her last clinical visit -okay to continue holding Arimidex at this time -answered all of patient's and her family member's questions in detail  FOLLOW-UP: Sent  to er for acute GI bleeding and symptomatic anemia RTC with Dr Candise Che with labs in 2 months  The total time spent in the appointment was 40 minutes* .  All of the patient's questions were answered with apparent satisfaction. The patient knows to call the clinic with any problems, questions or concerns.   Wyvonnia Lora MD MS AAHIVMS [facility] Hematology/Oncology Physician [facility]  .*Total Encounter Time as defined by the Centers for Medicare and Medicaid Services includes, in addition to the face-to-face time of a patient visit (documented in the note above) non-face-to-face time: obtaining and reviewing outside history, ordering and reviewing medications, tests or procedures, care coordination (communications with other health care professionals or caregivers) and documentation in the medical record.    I,Mitra Faeizi,acting as a Neurosurgeon for Wyvonnia Lora, MD.,have documented all relevant documentation on the behalf of Wyvonnia Lora, MD,as directed by  Wyvonnia Lora, MD while in the presence of Wyvonnia Lora, MD.  .I have reviewed the above documentation for accuracy and completeness, and I agree with the above. [name] MD

## 2023-01-03 NOTE — ED Notes (Signed)
Pt blood is ready per blood bank

## 2023-01-03 NOTE — Assessment & Plan Note (Signed)
Followed up with patient by Dr. Candise Che  continue to hold Arimidex

## 2023-01-03 NOTE — Assessment & Plan Note (Signed)
-  chronic avoid nephrotoxic medications such as NSAIDs, Vanco Zosyn combo,  avoid hypotension, continue to follow renal function  

## 2023-01-04 ENCOUNTER — Encounter: Payer: Self-pay | Admitting: Hematology

## 2023-01-04 ENCOUNTER — Encounter: Payer: Self-pay | Admitting: Nephrology

## 2023-01-04 ENCOUNTER — Encounter (HOSPITAL_COMMUNITY): Payer: Self-pay

## 2023-01-04 ENCOUNTER — Other Ambulatory Visit: Payer: Self-pay | Admitting: Nurse Practitioner

## 2023-01-04 DIAGNOSIS — R519 Headache, unspecified: Secondary | ICD-10-CM

## 2023-01-04 DIAGNOSIS — D649 Anemia, unspecified: Secondary | ICD-10-CM | POA: Diagnosis not present

## 2023-01-04 DIAGNOSIS — R42 Dizziness and giddiness: Secondary | ICD-10-CM

## 2023-01-04 DIAGNOSIS — K921 Melena: Secondary | ICD-10-CM

## 2023-01-04 DIAGNOSIS — Z7901 Long term (current) use of anticoagulants: Secondary | ICD-10-CM | POA: Diagnosis not present

## 2023-01-04 DIAGNOSIS — K922 Gastrointestinal hemorrhage, unspecified: Secondary | ICD-10-CM | POA: Diagnosis not present

## 2023-01-04 LAB — CBC
HCT: 24.8 % — ABNORMAL LOW (ref 36.0–46.0)
HCT: 27.7 % — ABNORMAL LOW (ref 36.0–46.0)
HCT: 29.6 % — ABNORMAL LOW (ref 36.0–46.0)
HCT: 26.4 % — ABNORMAL LOW (ref 36.0–46.0)
Hemoglobin: 7.8 g/dL — ABNORMAL LOW (ref 12.0–15.0)
Hemoglobin: 8.6 g/dL — ABNORMAL LOW (ref 12.0–15.0)
Hemoglobin: 9 g/dL — ABNORMAL LOW (ref 12.0–15.0)
Hemoglobin: 8 g/dL — ABNORMAL LOW (ref 12.0–15.0)
MCH: 28.8 pg (ref 26.0–34.0)
MCH: 28.3 pg (ref 26.0–34.0)
MCH: 28.6 pg (ref 26.0–34.0)
MCH: 28.3 pg (ref 26.0–34.0)
MCHC: 31.5 g/dL (ref 30.0–36.0)
MCHC: 31 g/dL (ref 30.0–36.0)
MCHC: 30.4 g/dL (ref 30.0–36.0)
MCHC: 30.3 g/dL (ref 30.0–36.0)
MCV: 91.5 fL (ref 80.0–100.0)
MCV: 91.1 fL (ref 80.0–100.0)
MCV: 94 fL (ref 80.0–100.0)
MCV: 93.3 fL (ref 80.0–100.0)
Platelets: 194 10*3/uL (ref 150–400)
Platelets: 195 10*3/uL (ref 150–400)
Platelets: 194 10*3/uL (ref 150–400)
Platelets: 182 10*3/uL (ref 150–400)
RBC: 2.71 MIL/uL — ABNORMAL LOW (ref 3.87–5.11)
RBC: 3.04 MIL/uL — ABNORMAL LOW (ref 3.87–5.11)
RBC: 3.15 MIL/uL — ABNORMAL LOW (ref 3.87–5.11)
RBC: 2.83 MIL/uL — ABNORMAL LOW (ref 3.87–5.11)
RDW: 15.1 % (ref 11.5–15.5)
RDW: 15.2 % (ref 11.5–15.5)
RDW: 15.4 % (ref 11.5–15.5)
RDW: 15.1 % (ref 11.5–15.5)
WBC: 8.3 10*3/uL (ref 4.0–10.5)
WBC: 8.3 10*3/uL (ref 4.0–10.5)
WBC: 8.7 10*3/uL (ref 4.0–10.5)
WBC: 7.1 10*3/uL (ref 4.0–10.5)
nRBC: 0 % (ref 0.0–0.2)
nRBC: 0 % (ref 0.0–0.2)
nRBC: 0 % (ref 0.0–0.2)
nRBC: 0 % (ref 0.0–0.2)

## 2023-01-04 LAB — IRON AND IRON BINDING CAPACITY (CC-WL,HP ONLY)
Iron: 15 ug/dL — ABNORMAL LOW (ref 28–170)
Saturation Ratios: 8 % — ABNORMAL LOW (ref 10.4–31.8)
TIBC: 193 ug/dL — ABNORMAL LOW (ref 250–450)
UIBC: 178 ug/dL

## 2023-01-04 LAB — COMPREHENSIVE METABOLIC PANEL
ALT: 15 U/L (ref 0–44)
AST: 20 U/L (ref 15–41)
Albumin: 2.4 g/dL — ABNORMAL LOW (ref 3.5–5.0)
Alkaline Phosphatase: 66 U/L (ref 38–126)
Anion gap: 9 (ref 5–15)
BUN: 73 mg/dL — ABNORMAL HIGH (ref 8–23)
CO2: 22 mmol/L (ref 22–32)
Calcium: 7.8 mg/dL — ABNORMAL LOW (ref 8.9–10.3)
Chloride: 101 mmol/L (ref 98–111)
Creatinine, Ser: 2.16 mg/dL — ABNORMAL HIGH (ref 0.44–1.00)
GFR, Estimated: 22 mL/min — ABNORMAL LOW (ref >60)
Glucose, Bld: 117 mg/dL — ABNORMAL HIGH (ref 70–99)
Potassium: 4 mmol/L (ref 3.5–5.1)
Sodium: 132 mmol/L — ABNORMAL LOW (ref 135–145)
Total Bilirubin: 0.8 mg/dL (ref 0.3–1.2)
Total Protein: 6.3 g/dL — ABNORMAL LOW (ref 6.5–8.1)

## 2023-01-04 LAB — GLUCOSE, CAPILLARY
Glucose-Capillary: 118 mg/dL — ABNORMAL HIGH (ref 70–99)
Glucose-Capillary: 172 mg/dL — ABNORMAL HIGH (ref 70–99)
Glucose-Capillary: 181 mg/dL — ABNORMAL HIGH (ref 70–99)
Glucose-Capillary: 82 mg/dL (ref 70–99)
Glucose-Capillary: 93 mg/dL (ref 70–99)
Glucose-Capillary: 94 mg/dL (ref 70–99)

## 2023-01-04 LAB — HEMOGLOBIN A1C
Hgb A1c MFr Bld: 7.2 % — ABNORMAL HIGH (ref 4.8–5.6)
Mean Plasma Glucose: 159.94 mg/dL

## 2023-01-04 LAB — MAGNESIUM: Magnesium: 1.7 mg/dL (ref 1.7–2.4)

## 2023-01-04 LAB — PHOSPHORUS: Phosphorus: 3 mg/dL (ref 2.5–4.6)

## 2023-01-04 LAB — VITAMIN B12: Vitamin B-12: 367 pg/mL (ref 180–914)

## 2023-01-04 LAB — FOLATE: Folate: 38.5 ng/mL (ref >5.9)

## 2023-01-04 LAB — IRON AND TIBC
Iron: 62 ug/dL (ref 28–170)
Saturation Ratios: 38 % — ABNORMAL HIGH (ref 10.4–31.8)
TIBC: 165 ug/dL — ABNORMAL LOW (ref 250–450)
UIBC: 103 ug/dL

## 2023-01-04 LAB — FERRITIN: Ferritin: 65 ng/mL (ref 11–307)

## 2023-01-04 LAB — PREALBUMIN: Prealbumin: 10 mg/dL — ABNORMAL LOW (ref 18–38)

## 2023-01-04 MED ORDER — BOOST / RESOURCE BREEZE PO LIQD CUSTOM
1.0000 | Freq: Three times a day (TID) | ORAL | Status: DC
  Administered 2023-01-04 – 2023-01-06 (×4): 1 via ORAL

## 2023-01-04 MED ORDER — HYDROCODONE-ACETAMINOPHEN 5-325 MG PO TABS: 1.0000 | ORAL_TABLET | ORAL | 0 refills | Status: DC | PRN

## 2023-01-04 MED ORDER — PANTOPRAZOLE SODIUM 40 MG IV SOLR
40.0000 mg | Freq: Two times a day (BID) | INTRAVENOUS | Status: DC
  Administered 2023-01-04 – 2023-01-06 (×4): 40 mg via INTRAVENOUS
  Filled 2023-01-04 (×4): qty 10

## 2023-01-04 MED ORDER — MAGNESIUM SULFATE IN D5W 1-5 GM/100ML-% IV SOLN
1.0000 g | Freq: Once | INTRAVENOUS | Status: AC
  Administered 2023-01-04: 1 g via INTRAVENOUS
  Filled 2023-01-04: qty 100

## 2023-01-04 MED ORDER — ADULT MULTIVITAMIN W/MINERALS CH
1.0000 | ORAL_TABLET | Freq: Every day | ORAL | Status: DC
  Administered 2023-01-04 – 2023-01-06 (×2): 1 via ORAL
  Filled 2023-01-04 (×2): qty 1

## 2023-01-04 NOTE — Evaluation (Signed)
Occupational Therapy Evaluation Patient Details Name: [name] MRN: [id number] DOB: 11/13/1934 Today's Date: 01/04/2023   History of Present Illness 88 y.o. female admitted 01/03/23 for upper GI bleed. Past medical history significant of HTN, HLD, diastolic CHF, COPD, esophagitis, DM2, CKD stage IV, breast cancer, PE, NSTEMI, obesity, vertigo, polymyalgia rheumatica, dementia   Clinical Impression   Pt is currently limited by the below listed deficits which compromise her ADL performance (see OT problem list).  At her baseline, she required assist for bathing, dressing, and toileting tasks, though she currently appears to be weaker and more deconditioned than typical. She will benefit from OT services in the acute setting to maximize her ADL participation and to facilitate her safe return home. OT recommends resumption of home health OT and PT services.       If plan is discharge home, recommend the following: A lot of help with walking and/or transfers;A lot of help with bathing/dressing/bathroom;Assistance with cooking/housework;Direct supervision/assist for medications management;Help with stairs or ramp for entrance    Functional Status Assessment  Patient has had a recent decline in their functional status and demonstrates the ability to make significant improvements in function in a reasonable and predictable amount of time.  Equipment Recommendations  None recommended by OT    Recommendations for Other Services       Precautions / Restrictions Precautions Precautions: Fall Restrictions Weight Bearing Restrictions: No      Mobility Bed Mobility Overal bed mobility: Needs Assistance Bed Mobility: Rolling Rolling: Mod assist                      ADL either performed or assessed with clinical judgement   ADL Overall ADL's : Needs assistance/impaired Eating/Feeding: Set up;Bed level   Grooming: Minimal assistance;Bed level           Upper Body  Dressing : Moderate assistance;Bed level   Lower Body Dressing: Total assistance;Bed level       Toileting- Clothing Manipulation and Hygiene: Total assistance;Bed level                            Pertinent Vitals/Pain Pain Assessment Pain Assessment: 0-10 Pain Score: 8  Pain Location: pelvis Pain Intervention(s): Limited activity within patient's tolerance, Monitored during session, Repositioned     Extremity/Trunk Assessment Upper Extremity Assessment Upper Extremity Assessment: Right hand dominant;LUE deficits/detail;RUE deficits/detail RUE Deficits / Details: chronic shoulder AROM limitations; functional grip strength LUE Deficits / Details: chronic shoulder AROM limitations; functional grip strength   Lower Extremity Assessment Lower Extremity Assessment: Generalized weakness;LLE deficits/detail;RLE deficits/detail RLE Deficits / Details: decreased knee AROM due to reported arthritis LLE Deficits / Details: decreased knee AROM due to reported arthritis       Communication     Cognition Arousal: Alert Behavior During Therapy: Flat affect Overall Cognitive Status: History of cognitive impairments - at baseline            General Comments: oriented to person, place, month, and year; able to follow 1 step commands without difficulty                Home Living Family/patient expects to be discharged to:: Private residence Living Arrangements: Children (daughter who is retired) Available Help at Discharge: Family Type of Home: House Home Access: Level entry     Home Layout: Two level;Able to live on main level with bedroom/bathroom     Bathroom Shower/Tub: Walk-in shower  Home Equipment: Wheelchair - manual;BSC/3in1;Rollator (4 wheels);Shower seat - built in          Prior Functioning/Environment Prior Level of Function : Needs assist             Mobility Comments:  (Has not ambulated in years. Required assist from family for  stand-pivot transfers into and out of wheelchair) ADLs Comments: Required at least mod assist for bathing, dressing, and toileting (uses bedside commode); family managed cooking and cleaning. Pt was receiving home PT and OT.        OT Problem List: Decreased strength;Decreased range of motion;Decreased activity tolerance;Impaired balance (sitting and/or standing);Impaired sensation;Pain      OT Treatment/Interventions: Self-care/ADL training;Therapeutic exercise;Energy conservation;DME and/or AE instruction;Therapeutic activities;Cognitive remediation/compensation;Patient/family education;Balance training    OT Goals(Current goals can be found in the care plan section) Acute Rehab OT Goals Patient Stated Goal: to return home at discharge OT Goal Formulation: With patient/family Time For Goal Achievement: 01/18/23 Potential to Achieve Goals: Fair ADL Goals Pt Will Perform Grooming: with set-up;sitting Pt Will Perform Upper Body Dressing: with min assist;sitting Pt Will Transfer to Toilet: with contact guard assist;bedside commode;stand pivot transfer Additional ADL Goal #1: Pt will perform bed mobility with min assist, in prep for progressive ADL participation.  OT Frequency: Min 1X/week       AM-PAC OT "6 Clicks" Daily Activity     Outcome Measure Help from another person eating meals?: A Little Help from another person taking care of personal grooming?: A Little Help from another person toileting, which includes using toliet, bedpan, or urinal?: A Lot Help from another person bathing (including washing, rinsing, drying)?: A Lot Help from another person to put on and taking off regular upper body clothing?: A Lot Help from another person to put on and taking off regular lower body clothing?: Total 6 Click Score: 13   End of Session Equipment Utilized During Treatment: Other (comment) (N/A) Nurse Communication: Mobility status  Activity Tolerance: Other (comment) (fair+  tolerance) Patient left: in bed;with call bell/phone within reach;with bed alarm set;with family/visitor present  OT Visit Diagnosis: Muscle weakness (generalized) (M62.81)                Time: 2440-1027 OT Time Calculation (min): 26 min Charges:  OT General Charges $OT Visit: 1 Visit OT Evaluation $OT Eval Moderate Complexity: 1 Mod    Royer Cristobal L Aury Scollard, OTR/L 01/04/2023, 5:35 PM

## 2023-01-04 NOTE — Evaluation (Signed)
Physical Therapy Evaluation Patient Details Name: [name] MRN: [id number] DOB: 1934/10/17 Today's Date: 01/04/2023  History of Present Illness  88 y.o. female admitted 01/03/23 for upper GI bleed. Past medical history significant of HTN, HLD, diastolic CHF, COPD, esophagitis, DM2, CKD stage IV, breast cancer, PE, NSTEMI, obesity, vertigo, polymyalgia rheumatica, dementia  Clinical Impression  Pt admitted with above diagnosis.  Pt currently with functional limitations due to the deficits listed below (see PT Problem List). Pt will benefit from acute skilled PT to increase their independence and safety with mobility to allow discharge.  Pt admitted from home and typically has assist for transfers to/from her wheelchair at baseline.  Pt assisted OOB to recliner today.  Pt reports mild dizziness which resolved after sitting.  Family member present for session and feels pt is close to her baseline and reports pt was receiving HHPT prior to admission.         If plan is discharge home, recommend the following: A little help with walking and/or transfers;A little help with bathing/dressing/bathroom   Can travel by private vehicle        Equipment Recommendations None recommended by PT  Recommendations for Other Services       Functional Status Assessment Patient has had a recent decline in their functional status and demonstrates the ability to make significant improvements in function in a reasonable and predictable amount of time.     Precautions / Restrictions Precautions Precautions: Fall Restrictions Weight Bearing Restrictions: No      Mobility  Bed Mobility Overal bed mobility: Needs Assistance Bed Mobility: Supine to Sit     Supine to sit: Mod assist, HOB elevated, Used rails     General bed mobility comments: cues for technique, assist to bring Lt LE over EOB (due to hip pain) and trunk upright    Transfers Overall transfer level: Needs assistance Equipment  used: Rolling walker (2 wheels) Transfers: Sit to/from Stand, Bed to chair/wheelchair/BSC Sit to Stand: Min assist, From elevated surface   Step pivot transfers: Min assist       General transfer comment: verbal cues for hand placement, tends to keep trunk forward flexed upon standing; daughter reports appearance near baseline    Ambulation/Gait               General Gait Details: transfers to w/c only at baseline, not ambulatory  Stairs            Wheelchair Mobility     Tilt Bed    Modified Rankin (Stroke Patients Only)       Balance Overall balance assessment: Mild deficits observed, not formally tested                                           Pertinent Vitals/Pain Pain Assessment Pain Assessment: Faces Faces Pain Scale: Hurts little more Pain Location: tender lower legs, chronic left hip pain Pain Intervention(s): Monitored during session, Repositioned    Home Living Family/patient expects to be discharged to:: Private residence Living Arrangements: Children Available Help at Discharge: Family;Available 24 hours/day Type of Home: House Home Access: Level entry       Home Layout: One level Home Equipment: Wheelchair - manual;BSC/3in1;Shower seat;Grab bars - tub/shower;Hand held Stage manager (4 wheels)      Prior Function Prior Level of Function : Needs assist  Mobility Comments: Uses WC mainly, assist for all transfers to and from chair/recliner       Extremity/Trunk Assessment        Lower Extremity Assessment Lower Extremity Assessment: Generalized weakness       Communication   Communication Communication: Hearing impairment  Cognition Arousal: Alert Behavior During Therapy: Flat affect Overall Cognitive Status: History of cognitive impairments - at baseline                                 General Comments: hx dementia        General Comments      Exercises      Assessment/Plan    PT Assessment Patient needs continued PT services  PT Problem List Decreased strength;Decreased activity tolerance;Decreased balance;Decreased mobility;Decreased knowledge of use of DME       PT Treatment Interventions Gait training;DME instruction;Balance training;Functional mobility training;Therapeutic activities;Therapeutic exercise;Patient/family education    PT Goals (Current goals can be found in the Care Plan section)  Acute Rehab PT Goals PT Goal Formulation: With patient/family Time For Goal Achievement: 01/18/23 Potential to Achieve Goals: Good    Frequency Min 1X/week     Co-evaluation               AM-PAC PT "6 Clicks" Mobility  Outcome Measure Help needed turning from your back to your side while in a flat bed without using bedrails?: A Little Help needed moving from lying on your back to sitting on the side of a flat bed without using bedrails?: A Little Help needed moving to and from a bed to a chair (including a wheelchair)?: A Little Help needed standing up from a chair using your arms (e.g., wheelchair or bedside chair)?: A Little Help needed to walk in hospital room?: A Little Help needed climbing 3-5 steps with a railing? : A Little 6 Click Score: 18    End of Session Equipment Utilized During Treatment: Gait belt Activity Tolerance: Patient tolerated treatment well Patient left: in chair;with call bell/phone within reach;with chair alarm set Nurse Communication: Mobility status PT Visit Diagnosis: Other abnormalities of gait and mobility (R26.89)    Time: 0981-1914 PT Time Calculation (min) (ACUTE ONLY): 26 min   Charges:   PT Evaluation $PT Eval Low Complexity: 1 Low PT Treatments $Therapeutic Activity: 8-22 mins PT General Charges $$ ACUTE PT VISIT: 1 Visit       Thomasene Mohair PT, DPT Physical Therapist Acute Rehabilitation Services Office: [phone]   Kati L Payson 01/04/2023, 11:18 AM

## 2023-01-04 NOTE — H&P (View-Only) (Signed)
 Consultation Note   Referring Provider:  [facility] PCP: [name], MD Primary Gastroenterologist:  Dr. Leone Payor       Reason for Consultation: Lower GI bleed DOA: 01/03/2023         Hospital Day: 2  ASSESSMENT & PLAN   88 y.o. year old female with medical history significant of HTN, HLD, diastolic CHF, COPD, esophagitis, DM2, CKD stage IV, PE/DVT, breast cancer. Patient was referred to ER by oncology for anemia and patient reported history of dark stool.  Acute on chronic anemia secondary to possible GI bleed. Hgb 7.0>7.8>8.6. Baseline 8-9. History of IDA following bariatric surgery. Patients daughter reports she is not on iron. Iron 62. Ferritin 65. B12 367. Fecal occult positive. Patient received 1 unit PRBCs on 8/28. Endoscopy 2018. Colonoscopy has been over 20 years.  -Monitor CBC -Clear liquid diet, NPO after MN -Changed Protonix to 40 mg IV BID, should probably continue PPI therapy after discharge -Consider IV iron infusion during this admission -Endoscopic procedures tomorrow per Dr. Marina Goodell  GERD- patient reports she takes OTC Rolaids at home.  Cholelithiasis,asymptomatic normal LFT's, denies RUQ abdominal pain -monitor  CKD stage IV   BUN 77>75>73, Creat 2.22> 2.32> 2.16. Baseline 2.3. -managed   Hx of DVT/PE (2023)- on Eliquis. Last dose reported 8/28 am. -On hold  CHF. (2/23) EF 55 to 60%. Pt on Demadex. -managed by cardiology  Breast CA - patient has not been able to take Arimidex . -Managed by Oncology  Additional co-morbidities include: COPD - on symbicort, no oxygen ,DM2- on insulin. Ha1c 7.2, mild dementia- on namenda- a&Ox3  HISTORY OF PRESENT ILLNESS   Patient sitting in recliner, daughter at bedside. Patient poor historian, daughter states she was staying with her and she had dark stools for two weeks up until yesterday. She reports she is not taking oral Iron or Pepto Bismul. No NSAID use. She is  on Eliquis for hx of PE/DVT and last dose yesterday morning (8/28). Patient reports she has chronic constipation and taking Linzess po every other day. She has bowel movement every 2-3 days. Admits to epigastric discomfort and heartburn after eating and states she takes OTC Rolaids at home which helps some. Patients daughter reports one episode of hematuria. She saw her nephrologist this week, but her grandson took her so not reported. Endoscopy done with Dr. Leone Payor for dysphagia in 11/2016. Patient reports colonoscopy 20+ years ago.    Previous GI Evaluations   CT ABDOMEN AND PELVIS WITHOUT CONTRAST  (8/28)  IMPRESSION: 1. No acute intra-abdominal or pelvic pathology. 2. Cholelithiasis. 3.  Aortic Atherosclerosis (ICD10-I70.0)  Upper Endoscopy (11/2016)  Impression: The examined esophagus was moderately tortuous. A TTS dilator was passed through the scope. Dilation with a 15- 16. 5- 18 mm balloon dilator was performed to 18 mm. I withdrew balloon inflated at 18 mm - no difficulty and no heme.  A benign- appearing, intrinsic mild stenosis was found in the gastric body. This was traversed. It was related to takedown and reversal of prior gastric bypass - > 20 mm lumen. Some pill granules trapped above it.  The exam was otherwise without abnormality.  The cardia and gastric fundus were otherwise normal post- op on retroflexion.  Labs  and Imaging:  CT CHEST WITHOUT CONTRAST  (8/23)  IMPRESSION: 1. No acute findings in the chest or evidence interstitial lung disease. 2. Coronary artery calcifications aortic Atherosclerosis  PORTABLE CHEST 1 VIEW (8/23)  IMPRESSION: 1. Increased ill-defined interstitial opacities, worst at the lung bases, concerning for atypical infection or mild interstitial pulmonary edema.    Echo (06/2021) Left ventricular ejection fraction, by estimation, is 55 to 60%.   Recent Labs    01/03/23 1415 01/03/23 1635 01/04/23 0425  WBC 6.4 7.6 8.3   HGB 7.1* 7.0* 7.8*  HCT 23.1* 23.4* 24.8*  PLT 216 213 194   Recent Labs    01/03/23 1415 01/03/23 1635 01/04/23 0425  NA 138 137 132*  K 4.3 4.6 4.0  CL 105 103 101  CO2 29 25 22   GLUCOSE 144* 142* 117*  BUN 77* 75* 73*  CREATININE 2.22* 2.32* 2.16*  CALCIUM 8.5* 8.7* 7.8*   Recent Labs    01/04/23 0425  PROT 6.3*  ALBUMIN 2.4*  AST 20  ALT 15  ALKPHOS 66  BILITOT 0.8   No results for input(s): "HEPBSAG", "HCVAB", "HEPAIGM", "HEPBIGM" in the last 72 hours. No results for input(s): "LABPROT", "INR" in the last 72 hours.    Past Medical History:  Diagnosis Date   Abdominal pain 01/26/2012   Acute deep vein thrombosis (DVT) of femoral vein of right lower extremity (HCC) 03/26/2022   Acute kidney injury superimposed on chronic kidney disease (HCC) 12/18/2014   Acute upper GI bleed 03/26/2022   Allergy    takes Mucinex daily as needed   Anemia, unspecified    Anxiety    takes Clonazepam daily as needed   Anxiety associated with depression 06/08/2017   Arthritis    Breast cancer (HCC)    Breast cancer screening 02/19/2014   Breast mass 10/27/2014   Chronic diastolic CHF (congestive heart failure) (HCC)    takes Furosemide daily   Chronic pain syndrome 02/06/2015   CKD (chronic kidney disease), stage III (HCC)    Coronary artery disease    a. s/p IMI 2004 tx with BMS to RCA;  b. s/p Promus DES to RCA 2/12 c. abnormal nuc 2016 -> cath with med rx. d. NSTEMI 02/2020  mv CAD with severe ISR in pRCA and m/dRCA lesion both treated with DES.   Debility 08/01/2012   Dementia (HCC)    Depression    takes Cymbalta daily   Diabetes mellitus    insulin daily   DOE (dyspnea on exertion) 06/24/2014   Dysphagia, unspecified(787.20) 07/31/2012   Fungal dermatitis 11/13/2007   Qualifier: Diagnosis of  By: Amador Cunas  MD, Janett Labella    GERD (gastroesophageal reflux disease)    takes Protonix daily   Gout, unspecified    Headache    occasionally   History of anemia due  to chronic kidney disease 03/26/2022   History of blood clots    History of deep venous thrombosis 01/27/2012   History of pulmonary embolus (PE) 04/22/2014   Hyperkalemia 07/26/2017   Hyperlipidemia    takes Pravastatin daily   Hypertension    Insomnia    Joint pain    Joint swelling    Morbid obesity (HCC)    Multiple benign nevi 11/15/2016   Myocardial infarction (HCC) 2004   Non-STEMI (non-ST elevated myocardial infarction) (HCC) 02/15/2020   Numbness    Obstructive sleep apnea    does not wear cpap   Osteoarthritis    Osteoarthritis    Osteoarthrosis, unspecified  whether generalized or localized, lower leg    Pain in the chest 12/31/2013   Pain, chronic    Personal history of other diseases of the digestive system 12/03/2006   Qualifier: Diagnosis of  By: Amador Cunas  MD, Janett Labella    Polymyalgia rheumatica [facility])    Presence of stent in right coronary artery    Pulmonary emboli (HCC) 01/2012   felt to need lifelong anticoagulation but Xarelto dc in 02/2020 due to need for DAPT   Situational depression 06/04/2009   Qualifier: Diagnosis of  By: Amador Cunas  MD, Janett Labella    Syncope, vasovagal 07/04/2021   Type 2 diabetes mellitus with hyperlipidemia (HCC) 02/16/2022   Urinary incontinence    takes Linzess daily   Vaginitis and vulvovaginitis 06/23/2016   Vertigo    hx of;was taking Meclizine if needed   Wears dentures     Past Surgical History:  Procedure Laterality Date   ABDOMINAL HYSTERECTOMY     partial   APPENDECTOMY     blood clots/legs and lungs  2013   BREAST BIOPSY Left 07/22/2014   BREAST BIOPSY Left 02/10/2013   BREAST LUMPECTOMY Left 11/05/2014   BREAST LUMPECTOMY WITH RADIOACTIVE SEED LOCALIZATION Left 11/05/2014   Procedure: LEFT BREAST LUMPECTOMY WITH RADIOACTIVE SEED LOCALIZATION;  Surgeon: Abigail Miyamoto, MD;  Location: [facility] OR;  Service: General;  Laterality: Left;   CARDIAC CATHETERIZATION     COLONOSCOPY     CORONARY ANGIOPLASTY  2   CORONARY  STENT INTERVENTION N/A 02/17/2020   Procedure: CORONARY STENT INTERVENTION;  Surgeon: Marykay Lex, MD;  Location: [facility] INVASIVE CV LAB;  Service: Cardiovascular;  Laterality: N/A;   ESOPHAGOGASTRODUODENOSCOPY (EGD) WITH PROPOFOL N/A 11/07/2016   Procedure: ESOPHAGOGASTRODUODENOSCOPY (EGD) WITH PROPOFOL;  Surgeon: Iva Boop, MD;  Location: [facility] ENDOSCOPY;  Service: Endoscopy;  Laterality: N/A;   EXCISION OF SKIN TAG Right 11/05/2014   Procedure: EXCISION OF RIGHT EYELID SKIN TAG;  Surgeon: Abigail Miyamoto, MD;  Location: [facility] OR;  Service: General;  Laterality: Right;   EYE SURGERY Bilateral    cataract    GASTRIC BYPASS  1977    reversed in [facility], [facility]   LEFT HEART CATH AND CORONARY ANGIOGRAPHY N/A 02/17/2020   Procedure: LEFT HEART CATH AND CORONARY ANGIOGRAPHY;  Surgeon: Marykay Lex, MD;  Location: [facility] INVASIVE CV LAB;  Service: Cardiovascular;  Laterality: N/A;   LEFT HEART CATHETERIZATION WITH CORONARY ANGIOGRAM N/A 06/29/2014   Procedure: LEFT HEART CATHETERIZATION WITH CORONARY ANGIOGRAM;  Surgeon: Lennette Bihari, MD;  Location: [facility] CATH LAB;  Service: Cardiovascular;  Laterality: N/A;   MEMBRANE PEEL Right 10/23/2018   Procedure: MEMBRANE PEEL;  Surgeon: Edmon Crape, MD;  Location: [facility] OR;  Service: Ophthalmology;  Laterality: Right;   MI with stent placement  2004   PARS PLANA VITRECTOMY Right 10/23/2018   Procedure: PARS PLANA VITRECTOMY WITH 25 GAUGE;  Surgeon: Edmon Crape, MD;  Location: [facility] OR;  Service: Ophthalmology;  Laterality: Right;    Family History  Problem Relation Age of Onset   Breast cancer Mother 52   Heart disease Mother    Throat cancer Father    Hypertension Father    Arthritis Father    Diabetes Father    Arthritis Sister    Obesity Sister    Diabetes Sister    Heart disease Cousin    Colon cancer Neg Hx    Stomach cancer Neg Hx    Esophageal cancer Neg Hx     Prior  to Admission medications   Medication Sig Start Date End Date Taking?  Authorizing Provider  albuterol (PROAIR HFA) 108 (90 Base) MCG/ACT inhaler Inhale 1-2 puffs into the lungs every 6 (six) hours as needed for wheezing or shortness of breath. 12/19/18  Yes Sharon Seller, NP  apixaban (ELIQUIS) 5 MG TABS tablet Take 1 tablet (5 mg total) by mouth 2 (two) times daily. 04/05/22 03/31/23 Yes [name], MD  atorvastatin (LIPITOR) 80 MG tablet TAKE 1 TABLET BY MOUTH EVERY DAY Patient taking differently: Take 80 mg by mouth at bedtime. 12/04/22  Yes [name], MD  benzonatate (TESSALON) 100 MG capsule Take 1 capsule (100 mg total) by mouth 2 (two) times daily as needed for cough. 09/05/22  Yes [name], MD  carvedilol (COREG) 12.5 MG tablet TAKE 1 TABLET BY MOUTH 2 TIMES DAILY Patient taking differently: Take 12.5 mg by mouth 2 (two) times daily with a meal. 12/05/22  Yes [name], MD  cetirizine (ZYRTEC) 10 MG tablet Take 10 mg by mouth daily as needed for allergies or rhinitis.   Yes [provider]  Continuous Glucose Sensor (FREESTYLE LIBRE 14 DAY SENSOR) MISC PLACE 1 DEVICE ON THE SKIN AS DIRECTED EVERY 14 DAYS Patient taking differently: Inject 1 Device into the skin every 14 (fourteen) days. 11/27/22  Yes Shamleffer, Konrad Dolores, MD  diclofenac Sodium (VOLTAREN) 1 % GEL Apply 2 g topically 4 (four) times daily. Patient taking differently: Apply 2 g topically 4 (four) times daily as needed (pain). 02/23/22  Yes [name], MD  DULoxetine (CYMBALTA) 30 MG capsule Take 30 mg by mouth daily.   Yes [provider]  febuxostat (ULORIC) 40 MG tablet Take 1 tablet (40 mg total) by mouth daily. 04/04/22 03/30/23 Yes [name], MD  fluticasone (FLONASE) 50 MCG/ACT nasal spray Place 2 sprays into both nostrils daily for 14 days. Patient taking differently: Place 2 sprays into both nostrils every other day. 11/29/22 01/03/23 Yes Salvatore Decent, FNP  folic acid (FOLVITE) 1 MG tablet TAKE 1 TABLET BY MOUTH EVERY  DAY Patient taking differently: Take 1 mg by mouth daily. 12/05/22  Yes [name], MD  glucagon 1 MG injection Inject 1 mg into the vein once as needed for up to 1 dose. 04/13/22  Yes [name], MD  glucose 4 GM chewable tablet Chew 1 tablet (4 g total) by mouth as needed for low blood sugar. 04/05/22  Yes [name], MD  insulin glargine, 2 Unit Dial, (TOUJEO MAX SOLOSTAR) 300 UNIT/ML Solostar Pen Inject 40 Units into the skin daily. Patient assistance program provides. Patient reports 40 units 04/24/2022 Patient taking differently: Inject 34 Units into the skin daily before breakfast. 05/31/22  Yes Shamleffer, Konrad Dolores, MD  linaclotide (LINZESS) 145 MCG CAPS capsule Patient take 1 capsule daily.  May add an additional capsule for breakthrough constipation associated with opioid use Patient taking differently: Take 145 mcg by mouth every other day. 12/19/22  Yes [name], MD  meclizine (ANTIVERT) 25 MG tablet Take 1-2 tablets (25-50 mg total) by mouth 2 (two) times daily as needed for dizziness. 06/13/22  Yes [name], MD  memantine (NAMENDA) 5 MG tablet TAKE 2 TABLETS BY MOUTH EVERY MORNING and TAKE 1 TABLET BY MOUTH EVERY EVENING Patient taking differently: Take 5-10 mg by mouth See admin instructions. Take 10 mg by mouth in the morning and 5 mg in the evening 10/10/22  Yes Janee Morn,  Ebbie Ridge, MD  nitroGLYCERIN (NITROSTAT) 0.4 MG SL tablet Take one tablet under the tongue every 5 minutes as needed for chest pain Patient taking differently: Place 0.4 mg under the tongue every 5 (five) minutes as needed for chest pain. 09/24/13  Yes Sharon Seller, NP  NOVOLOG FLEXPEN 100 UNIT/ML FlexPen Inject 14 Units into the skin 2 (two) times daily with a meal. Max daily dose 70 units 04/03/22  Yes Shamleffer, Konrad Dolores, MD  ondansetron (ZOFRAN) 4 MG tablet Take 1 tablet (4 mg total) by mouth every 8 (eight) hours as needed for nausea or vomiting. 03/14/22  Yes  [name], MD  oxyCODONE-acetaminophen (PERCOCET) 7.5-325 MG tablet Take 1 tablet by mouth 2 (two) times daily as needed (for pain).   Yes [provider]  pantoprazole (PROTONIX) 20 MG tablet TAKE 1 TABLET BY MOUTH EVERY DAY Patient taking differently: Take 20 mg by mouth daily before breakfast. 10/23/22  Yes [name], MD  Semaglutide,0.25 or 0.5MG /DOS, (OZEMPIC, 0.25 OR 0.5 MG/DOSE,) 2 MG/3ML SOPN Inject 0.5 mg into the skin once a week. Patient taking differently: Inject 0.5 mg into the skin every Monday. 08/21/22  Yes Shamleffer, Konrad Dolores, MD  SYMBICORT 160-4.5 MCG/ACT inhaler Inhale 2 puffs by mouth twice daily Patient taking differently: Inhale 2 puffs into the lungs 2 (two) times daily as needed (for flares). 05/12/22  Yes Sharon Seller, NP  torsemide (DEMADEX) 20 MG tablet TAKE 1 TABLET BY MOUTH EVERY DAY TAKE AN EXTRA DOES FOR WEIGHT GAIN, 3LBs IN 1 DAY OR 5LBS IN 1 WEEK Patient taking differently: Take 20 mg by mouth. Take 20 mg by mouth in the morning and an additional 20 mg for a weight gain of 3 pounds in one day or 5 pounds in one week 01/02/23  Yes [name], MD  traZODone (DESYREL) 50 MG tablet Take 50 mg by mouth at bedtime.   Yes [provider]  TYLENOL 500 MG tablet Take 1,000 mg by mouth every 6 (six) hours as needed for mild pain or headache.   Yes [provider]  TYLENOL PM EXTRA STRENGTH 500-25 MG TABS tablet Take 2 tablets by mouth at bedtime.   Yes [provider]  Vitamin D, Ergocalciferol, (DRISDOL) 1.25 MG (50000 UNIT) CAPS capsule TAKE 1 CAPSULE BY MOUTH ONCE WEEKLY Patient taking differently: Take 50,000 Units by mouth every Monday. 01/02/23  Yes [name], MD  docusate sodium (COLACE) 100 MG capsule Take 2 capsules (200 mg total) by mouth daily. Patient not taking: Reported on 01/03/2023 12/19/22 12/14/23  [name], MD  Elastic Bandages & Supports (B & B CARPAL TUNNEL BRACE) MISC Use  brace on wrist as needed for wrist pain 04/24/22   [name], MD  montelukast (SINGULAIR) 10 MG tablet Take 1 tablet (10 mg total) by mouth at bedtime. Patient not taking: Reported on 01/03/2023 04/24/22   [name], MD  nystatin cream (MYCOSTATIN) Apply 1 Application topically 2 (two) times daily. Patient not taking: Reported on 01/03/2023 02/16/22   Bufford Spikes L, DO  sodium polystyrene (SPS) 15 GM/60ML suspension TAKE 60 MLS BY MOUTH ONCE WEEKLY TO KEEP POTASSIUM DOWN Patient not taking: Reported on 01/03/2023 07/21/22   [name], MD    Current Facility-Administered Medications  Medication Dose Route Frequency Provider Last Rate Last Admin   0.9 %  sodium chloride infusion (Manually program via Guardrails IV Fluids)   Intravenous Once Therisa Doyne, MD  Stopped at 01/04/23 0235   0.9 %  sodium chloride infusion   Intravenous Continuous Therisa Doyne, MD 75 mL/hr at 01/03/23 2336 New Bag at 01/03/23 2336   acetaminophen (TYLENOL) tablet 650 mg  650 mg Oral Q6H PRN Therisa Doyne, MD   650 mg at 01/03/23 2317   Or   acetaminophen (TYLENOL) suppository 650 mg  650 mg Rectal Q6H PRN Doutova, Anastassia, MD       albuterol (PROVENTIL) (2.5 MG/3ML) 0.083% nebulizer solution 2.5 mg  2.5 mg Nebulization Q2H PRN Doutova, Anastassia, MD       HYDROcodone-acetaminophen (NORCO/VICODIN) 5-325 MG per tablet 1-2 tablet  1-2 tablet Oral Q4H PRN Therisa Doyne, MD   1 tablet at 01/04/23 0231   insulin aspart (novoLOG) injection 0-9 Units  0-9 Units Subcutaneous Q4H Therisa Doyne, MD   1 Units at 01/03/23 2021   insulin glargine-yfgn (SEMGLEE) injection 30 Units  30 Units Subcutaneous Daily Doutova, Anastassia, MD       memantine (NAMENDA) tablet 10 mg  10 mg Oral Daily Doutova, Anastassia, MD       And   memantine (NAMENDA) tablet 5 mg  5 mg Oral QHS Doutova, Anastassia, MD   5 mg at 01/03/23 2317   mometasone-formoterol (DULERA) 200-5 MCG/ACT inhaler 2  puff  2 puff Inhalation BID Therisa Doyne, MD   2 puff at 01/04/23 0810   montelukast (SINGULAIR) tablet 10 mg  10 mg Oral QHS Doutova, Jonny Ruiz, MD   10 mg at 01/03/23 2317   ondansetron (ZOFRAN) tablet 4 mg  4 mg Oral Q6H PRN Therisa Doyne, MD       Or   ondansetron (ZOFRAN) injection 4 mg  4 mg Intravenous Q6H PRN Therisa Doyne, MD       [START ON 01/07/2023] pantoprazole (PROTONIX) injection 40 mg  40 mg Intravenous Q12H Doutova, Anastassia, MD       pantoprozole (PROTONIX) 80 mg /NS 100 mL infusion  8 mg/hr Intravenous Continuous Doutova, Anastassia, MD 10 mL/hr at 01/03/23 2336 8 mg/hr at 01/03/23 2336   traZODone (DESYREL) tablet 25 mg  25 mg Oral QHS Therisa Doyne, MD   25 mg at 01/03/23 2317    Allergies as of 01/03/2023 - Review Complete 01/03/2023  Allergen Reaction Noted   Sulfonamide derivatives Swelling and Other (See Comments)    Duloxetine  12/19/2022   Lokelma [sodium zirconium cyclosilicate] Other (See Comments) 09/05/2021   Aricept Palma Holter hcl] Diarrhea, Nausea And Vomiting, and Other (See Comments) 01/16/2017   Tramadol Nausea And Vomiting 09/17/2014    Social History   Socioeconomic History   Marital status: Widowed    Spouse name: Not on file   Number of children: 3   Years of education: Not on file   Highest education level: High school graduate  Occupational History   Occupation: retired  Tobacco Use   Smoking status: Never    Passive exposure: Past   Smokeless tobacco: Never   Tobacco comments:    Both parents smoked, patient was exposed/ "Raised up in smoke, my whole life."  Vaping Use   Vaping status: Never Used  Substance and Sexual Activity   Alcohol use: No    Alcohol/week: 0.0 standard drinks of alcohol   Drug use: No   Sexual activity: Not on file  Other Topics Concern   Not on file  Social History Narrative   Not on file   Social Determinants of Health   Financial Resource Strain: Low Risk  (06/05/2022)  Overall Financial Resource Strain (CARDIA)    Difficulty of Paying Living Expenses: Not hard at all  Food Insecurity: No Food Insecurity (01/03/2023)   Hunger Vital Sign    Worried About Running Out of Food in the Last Year: Never true    Ran Out of Food in the Last Year: Never true  Transportation Needs: No Transportation Needs (01/03/2023)   PRAPARE - Administrator, Civil Service (Medical): No    Lack of Transportation (Non-Medical): No  Physical Activity: Insufficiently Active (06/05/2022)   Exercise Vital Sign    Days of Exercise per Week: 3 days    Minutes of Exercise per Session: 10 min  Stress: Stress Concern Present (06/05/2022)   [employer] of Occupational Health - Occupational Stress Questionnaire    Feeling of Stress : Rather much  Social Connections: Moderately Isolated (04/20/2022)   Social Connection and Isolation Panel [NHANES]    Frequency of Communication with Friends and Family: Once a week    Frequency of Social Gatherings with Friends and Family: Once a week    Attends Religious Services: 1 to 4 times per year    Active Member of [employer] or Organizations: Yes    Attends Banker Meetings: 1 to 4 times per year    Marital Status: Widowed  Intimate Partner Violence: Not At Risk (01/03/2023)   Humiliation, Afraid, Rape, and Kick questionnaire    Fear of Current or Ex-Partner: No    Emotionally Abused: No    Physically Abused: No    Sexually Abused: No    Code Status   Code Status: Limited: Do not attempt resuscitation (DNR) -DNR-LIMITED -Do Not Intubate/DNI   Review of Systems: All systems reviewed and negative except where noted in HPI.  Physical Exam: Vital signs in last 24 hours: Temp:  [97.7 F (36.5 C)-98.4 F (36.9 C)] 97.9 F (36.6 C) (08/29 0358) Pulse Rate:  [61-75] 61 (08/29 0358) Resp:  [10-21] 18 (08/29 0358) BP: (108-137)/(37-71) 119/52 (08/29 0358) SpO2:  [96 %-100 %] 96 % (08/29 0810) Weight:  [121.7 kg] 121.7 kg  (08/29 0116) Last BM Date : 01/04/23  General:  Pleasant female in NAD Psych:  Cooperative. Normal mood and affect Eyes: Pupils equal Ears:  Normal auditory acuity Nose: No deformity, discharge or lesions Neck:  Supple, no masses felt Lungs:  bilateral wheezing lower lobes, on RA Heart:  Regular rate, regular rhythm.  Abdomen:  Soft, nondistended,obese. Epigastric discomfort, active bowel sounds, no masses felt Rectal :  Deferred Msk: Symmetrical without gross deformities.  Neurologic:  Alert, oriented, grossly normal neurologically Extremities : trace edema BLE Skin:  Intact without significant lesions.    Intake/Output from previous day: 08/28 0701 - 08/29 0700 In: 818.8 [I.V.:276.6; Blood:400; IV Piggyback:142.1] Out: -  Intake/Output this shift:  No intake/output data recorded.  Principal Problem:   Upper GI bleed Active Problems:   Hyperlipidemia   Class 3 obesity (HCC)   Symptomatic anemia   Essential hypertension   History of DVT (deep vein thrombosis)   Chronic diastolic CHF (congestive heart failure) (HCC)   Long term current use of anticoagulant therapy   COPD with chronic bronchitis   Dementia (HCC)   Type 2 diabetes mellitus with hyperlipidemia (HCC)   Breast cancer (HCC)   CKD (chronic kidney disease), stage IV (HCC)   CAD (coronary artery disease)   Epigastric abdominal tenderness    Deanna May, NP-C @  01/04/2023, 9:05 AM  GI ATTENDING  History, laboratories, x-rays,  prior endoscopy reports all personally reviewed.  Patient seen and examined.  Multiple family members in room.  Agree with comprehensive consultation note as outlined above.  Elderly female with multiple significant medical problems on chronic anticoagulation is sent to the hospital with worsening of her chronic anemia.  Reported positive stools.  Hemoccult positive.  Prior history of gastric bypass.  Last EGD 2018 with esophageal dilation.  Last colonoscopy 2010 was normal.  Good response  to transfusion of blood with 1 unit.  Given her need to go back on anticoagulation, I think it is reasonable to pursue upper endoscopy with report of transient dark stools.  She is high risk.  I do not think she is fit for colonoscopy.  I discussed this with the patient and the family.The nature of the procedure, as well as the risks, benefits, and alternatives were carefully and thoroughly reviewed with the patient. Ample time for discussion and questions allowed. The patient understood, was satisfied, and agreed to proceed.  The procedure tentatively scheduled for tomorrow at 2:15 PM.  Wilhemina Bonito. Eda Keys., M.D. [facility] Division of Gastroenterology

## 2023-01-04 NOTE — Progress Notes (Signed)
PROGRESS NOTE  JERALINE DOOLIN  DOB: May 19, 1934  PCP: [name], MD UJW:[id number]  DOA: 01/03/2023  LOS: 1 day  Hospital Day: 2  Brief narrative: [name] is a 88 y.o. female with PMH significant for breast cancer, morbid obesity, OSA, DM2, HTN, HLD, CAD/stents, CHF, CKD, chronic anemia, DVT/PE on chronic anticoagulation, anxiety/depression, dementia, osteoarthritis, polymyalgia rheumatica, esophagitis.  8/28, patient was seen at cancer center with complaint of lightheadedness, dark stool for 7 days and maroon stool for 2 days. Labs showed hemoglobin low at 7 and was sent to the ED Hemoccult positive in the ED Ordered a unit of PRBC transfusion Started on Protonix drip Admitted to [facility] GI consulted  Subjective: Patient was seen and examined this morning. Chart reviewed Hemodynamically stable Labs this morning with hemoglobin low at 7.8, sodium low at 132, creatinine 2.16  Assessment and plan: Acute GI bleeding H/o GERD Symptomatic anemia Presented with melena, maroon stools Hemoglobin low at 7 compared to baseline between 9 and 10 FOBT positive GI consulted Currently on Protonix. Clear liquid diet started by GI.  Eliquis on hold Recent Labs    01/17/22 1340 02/08/22 1557 05/05/22 1624 07/11/22 1253 09/12/22 1557 12/29/22 1239 01/03/23 1415 01/03/23 1416 01/03/23 1635 01/04/23 0425 01/04/23 1015  HGB 9.2*   < > 8.5* 8.7*   < > 7.6* 7.1*  --  7.0* 7.8* 8.6*  MCV 93.7   < > 91.4 93.0   < > 92.0 91.3  --  92.9 91.5 91.1  VITAMINB12  --   --  484  --   --   --   --   --   --  367  --   FOLATE  --   --  >24.0  --   --   --   --   --   --  38.5  --   FERRITIN 245  --  254 281  --   --   --  77  --  65  --   TIBC 217*  --  193* 239*  --   --  193*  --   --  165*  --   IRON 43  --  33* 27*  --   --  15*  --   --  62  --   RETICCTPCT  --   --   --   --   --   --   --   --  2.0  --   --    < > = values in this interval not displayed.   H/o DVT/PE  2023 Chronically anticoagulated on Eliquis.  Currently on hold  CKD 4 Creatinine at baseline above 2.  Currently mains at baseline. Recent Labs    03/26/22 1340 03/27/22 0035 03/29/22 0458 05/05/22 1624 07/11/22 1253 09/12/22 1557 12/29/22 1239 01/03/23 1415 01/03/23 1635 01/04/23 0425  BUN 52* 53* 41* 46* 56* 86* 100* 77* 75* 73*  CREATININE 2.13* 2.01* 2.00* 1.53* 1.97* 2.34* 2.76* 2.22* 2.32* 2.16*  CO2 27 28 27 26 28 23  21* 29 25 22    Hyponatremia Likely because of poor intake.  Continue monitor Recent Labs  Lab 12/29/22 1239 01/03/23 1415 01/03/23 1635 01/04/23 0425  NA 137 138 137 132*   Type 2 diabetes mellitus A1c 7.2 on 01/03/2023 PTA meds- Ozempic weekly, Lantus 34 units daily, NovoLog 14 units twice daily, Currently ordered for Semglee 30 units daily.  Glucose level low at 82 this morning.  I will hold  long-acting insulin for now.  Continue SSI/Accu-Cheks Recent Labs  Lab 01/03/23 2003 01/03/23 2228 01/04/23 0355 01/04/23 0735 01/04/23 1140  GLUCAP 145* 117* 118* 82 94   Hypertension Diastolic CHF PTA meds-carvedilol 12.5 mg twice daily, torsemide daily, Currently both on hold  CAD/stents HLD PTA meds-carvedilol, Eliquis, Lipitor Continue Lipitor  Dementia Continue Namenda  COPD with bronchitis Bronchodilators  Breast cancer Follows with Dr. Candise Che.  Arimidex on hold  Morbid Obesity  Body mass index is 42.01 kg/m. Patient has been advised to make an attempt to improve diet and exercise patterns to aid in weight loss.  Anxiety/depression PTA meds - Cymbalta 30 mg daily    Mobility: PT eval pending  Goals of care   Code Status: Limited: Do not attempt resuscitation (DNR) -DNR-LIMITED -Do Not Intubate/DNI      DVT prophylaxis:  SCDs Start: 01/03/23 2226   Antimicrobials: None Fluid: None currently Consultants: GI Family Communication: Daughter at bedside  Status: Inpatient Level of care:  Progressive   Patient is from:  Home Needs to continue in-hospital care: Pending GI workup Anticipated d/c to: Pending clinical course, PT eval      Diet:  Diet Order             Diet clear liquid Room service appropriate? Yes; Fluid consistency: Thin  Diet effective now                   Scheduled Meds:  sodium chloride   Intravenous Once   insulin aspart  0-9 Units Subcutaneous Q4H   memantine  10 mg Oral Daily   And   memantine  5 mg Oral QHS   mometasone-formoterol  2 puff Inhalation BID   montelukast  10 mg Oral QHS   pantoprazole  40 mg Intravenous Q12H   traZODone  25 mg Oral QHS    PRN meds: acetaminophen **OR** acetaminophen, albuterol, HYDROcodone-acetaminophen, ondansetron **OR** ondansetron (ZOFRAN) IV   Infusions:     Antimicrobials: Anti-infectives (From admission, onward)    None       Nutritional status:  Body mass index is 42.01 kg/m.          Objective: Vitals:   01/04/23 0810 01/04/23 1239  BP:  (!) 129/52  Pulse:  (!) 59  Resp:  16  Temp:  97.9 F (36.6 C)  SpO2: 96% 100%    Intake/Output Summary (Last 24 hours) at 01/04/2023 1347 Last data filed at 01/04/2023 1300 Gross per 24 hour  Intake 818.75 ml  Output 550 ml  Net 268.75 ml   Filed Weights   01/04/23 0116  Weight: 121.7 kg   Weight change:  Body mass index is 42.01 kg/m.   Physical Exam: General exam: Pleasant, elderly.  Not in distress Skin: No rashes, lesions or ulcers. HEENT: Atraumatic, normocephalic, no obvious bleeding Lungs: Clear to auscultation bilaterally CVS: Regular rate and rhythm, no murmur GI/Abd: soft, mild epigastric tenderness present, nondistended, bowel sound. CNS: Alert, awake, oriented x 3 Psychiatry: Mood appropriate Extremities: No pedal edema, no calf tenderness  Data Review: I have personally reviewed the laboratory data and studies available.  F/u labs ordered Unresulted Labs (From admission, onward)     Start     Ordered   01/05/23 0500  CBC with  Differential/Platelet  Daily,   R      01/04/23 1346   01/05/23 0500  Basic metabolic panel  Daily,   R      01/04/23 1346   01/04/23 0100  CBC  Now then every 6 hours,   R     Question:  Release to patient  Answer:  Immediate   01/03/23 2002            Total time spent in review of labs and imaging, patient evaluation, formulation of plan, documentation and communication with family: 55 minutes  Signed, Lorin Glass, MD [facility] 01/04/2023

## 2023-01-04 NOTE — Plan of Care (Signed)

## 2023-01-04 NOTE — TOC Initial Note (Signed)
Transition of Care [facility]) - Initial/Assessment Note    Patient Details  Name: [name] MRN: [id number] Date of Birth: [birth year]  [facility] CM/SW Contact:    Larrie Kass, LCSW Phone Number: 01/04/2023, 3:24 PM  Clinical Narrative:                  Pt is active with [facility] for [facility] services. TOC to follow   Expected Discharge Plan: Home w Home Health Services Barriers to Discharge: Continued Medical Work up   Patient Goals and CMS Choice            Expected Discharge Plan and Services                                              Prior Living Arrangements/Services                       Activities of Daily Living Home Assistive Devices/Equipment: Bedside commode/3-in-1, Environmental consultant (specify type) ADL Screening (condition at time of admission) Patient's cognitive ability adequate to safely complete daily activities?: Yes Is the patient deaf or have difficulty hearing?: Yes Does the patient have difficulty seeing, even when wearing glasses/contacts?: Yes Does the patient have difficulty concentrating, remembering, or making decisions?: Yes Patient able to express need for assistance with ADLs?: Yes Does the patient have difficulty dressing or bathing?: Yes Independently performs ADLs?: No Communication: Needs assistance Is this a change from baseline?: Pre-admission baseline Dressing (OT): Needs assistance Is this a change from baseline?: Pre-admission baseline Grooming: Needs assistance Is this a change from baseline?: Pre-admission baseline Feeding: Independent Bathing: Needs assistance Is this a change from baseline?: Pre-admission baseline Toileting: Needs assistance Is this a change from baseline?: Pre-admission baseline In/Out Bed: Needs assistance Is this a change from baseline?: Pre-admission baseline Walks in Home: Needs assistance Is this a change from baseline?: Pre-admission baseline Does the patient have  difficulty walking or climbing stairs?: Yes Weakness of Legs: Both Weakness of Arms/Hands: Both  Permission Sought/Granted                  Emotional Assessment              Admission diagnosis:  Upper GI bleed [K92.2] Patient Active Problem List   Diagnosis Date Noted   Upper GI bleed 01/03/2023   CAD (coronary artery disease) 01/03/2023   Epigastric abdominal tenderness 01/03/2023   Chills 12/19/2022   Chronic left hip pain 06/13/2022   Dependent for medication administration 06/13/2022   Very limited skin mobility 05/29/2022   Swelling of right upper extremity 04/27/2022   Carpal tunnel syndrome on both sides 04/27/2022   Diabetic peripheral neuropathy associated with type 2 diabetes mellitus (HCC) 04/05/2022   GAD (generalized anxiety disorder) 03/26/2022   Allergic rhinitis 03/26/2022   Type 2 diabetes mellitus with stage 4 chronic kidney disease, with long-term current use of insulin (HCC) 02/16/2022   Type 2 diabetes mellitus with diabetic neuropathy, with long-term current use of insulin (HCC) 02/16/2022   Dermatosis papulosa nigra 02/09/2022   CKD (chronic kidney disease), stage IV (HCC) 02/09/2022   Pneumobilia 02/08/2022   Severe episode of recurrent major depressive disorder, without psychotic features (HCC) 12/05/2021   Breast cancer (HCC) 10/22/2021   Chronic pain of right hand 04/27/2021   Iron deficiency anemia 09/13/2020   Bilateral primary  osteoarthritis of knee 04/12/2020   Chronic radicular lumbar pain 03/18/2020   Lumbar spondylosis 03/18/2020   Spinal stenosis, lumbar region, with neurogenic claudication 03/18/2020   Moderate nonproliferative diabetic retinopathy of both eyes without macular edema associated with type 2 diabetes mellitus (HCC) 08/27/2019   Type 2 diabetes mellitus with hyperlipidemia (HCC) 12/13/2017   GERD with esophagitis 11/15/2016   Dementia (HCC) 11/15/2016   Endometrial polyp 06/23/2016   Vitamin D deficiency 08/02/2015    Acute kidney injury superimposed on chronic kidney disease (HCC) 12/18/2014   Gout 10/27/2014   Vertigo 09/24/2014   Drug-induced constipation 05/10/2014   COPD with chronic bronchitis 04/21/2014   Estrogen deficiency 02/19/2014   Insomnia 09/17/2012   Long term current use of anticoagulant therapy 02/02/2012   Chronic diastolic CHF (congestive heart failure) (HCC) 01/29/2012   History of DVT (deep vein thrombosis) 01/27/2012   Class 3 obesity (HCC) 02/15/2009   Symptomatic anemia 02/15/2009   TENSION HEADACHE 01/07/2008   Hyperlipidemia 12/03/2006   Essential hypertension 12/03/2006   PCP:  [name], MD Pharmacy:   [facility] [address] [place] - [address] [place] [address] Phone: [phone] Fax: [phone]     Social Determinants of Health (SDOH) Social History: SDOH Screenings   Food Insecurity: No Food Insecurity (01/03/2023)  Housing: Low Risk  (01/03/2023)  Transportation Needs: No Transportation Needs (01/03/2023)  Utilities: Not At Risk (01/03/2023)  Alcohol Screen: Low Risk  (10/14/2021)  Depression (PHQ2-9): Low Risk  (12/21/2022)  Recent Concern: Depression (PHQ2-9) - High Risk (12/19/2022)  Financial Resource Strain: Low Risk  (06/05/2022)  Physical Activity: Insufficiently Active (06/05/2022)  Social Connections: Moderately Isolated (04/20/2022)  Stress: Stress Concern Present (06/05/2022)  Tobacco Use: Low Risk  (01/03/2023)   SDOH Interventions:     Readmission Risk Interventions    11/24/2021   10:03 AM 10/20/2021    3:50 PM  Readmission Risk Prevention Plan  Transportation Screening Complete Complete  Medication Review Oceanographer) Complete Complete  PCP or Specialist appointment within 3-5 days of discharge Complete Complete  HRI or Home Care Consult Complete Complete  SW Recovery Care/Counseling Consult Complete Complete  Palliative Care Screening Not Applicable Not  Applicable  Skilled Nursing Facility Not Applicable Not Applicable

## 2023-01-04 NOTE — Progress Notes (Signed)
Chaplain engaged in an initial visit with [name]. [name] voiced that she is feeling much better today. She stated that she was having a bad day yesterday. Chaplain worked to normalize her feelings and emotions. Chaplain also assessed that [name] did not want to process what was coming up for her yesterday emotionally and mentally.   Chaplain worked to establish relationship and rapport through getting to know [name]. She shared that she is from [place] and has a family reunion coming up this weekend. She moved to [place] is the 80's. She stated that she won't be able to attend the reunion because of not feeling well. She went on to say that she didn't know things had gotten this bad. Chaplain could assess grief around that awareness. Chaplain wanted to assess [name] meaning behind that when [name] arrived. [name] wanted to go ahead with her time with [name]. Chaplain offered understanding and support.   Chaplain offered reflective listening, a compassionate presence, and support. Chaplain will follow-up tomorrow.    01/04/23 0900  Spiritual Encounters  Type of Visit Initial  Care provided to: Patient  Referral source Nurse (RN/NT/LPN)  Reason for visit Routine spiritual support  Interventions  Spiritual Care Interventions Made Established relationship of care and support;Compassionate presence;Reflective listening;Normalization of emotions  Intervention Outcomes  Outcomes Awareness of support;Connection to spiritual care

## 2023-01-04 NOTE — Progress Notes (Signed)
Initial Nutrition Assessment  DOCUMENTATION CODES:   Obesity unspecified  INTERVENTION:  Multivitamin w/ minerals daily Boost Breeze po TID, each supplement provides 250 kcal and 9 grams of protein Advance diet as medically appropriate   NUTRITION DIAGNOSIS:   Increased nutrient needs related to chronic illness, cancer and cancer related treatments as evidenced by estimated needs.  GOAL:   Patient will meet greater than or equal to 90% of their needs  MONITOR:   PO intake, Supplement acceptance, Labs, Diet advancement, I & O's  REASON FOR ASSESSMENT:   Consult Assessment of nutrition requirement/status  ASSESSMENT:   88 y.o. female presented to the ED after being found anemic at the cancer center. PMH includes COPD, CHF, HTN, HLD, T2DM, GERD, CAD, dementia, CKD IV, breast cancer, and gout. Pt admitted with upper GI bleed.   8/28 - Admitted; diet advanced to clear liquids  RD working remotely at time of assessment. Unable to reach pt via phone in room. Plan for upper endoscopy tomorrow.   Per weight history, pt has had 13% of weight loss within the past year, this is not clinically significant for time frame.   Meal Intake  8/29: 0-100% x 2 meal  Medications reviewed and include: NovoLog SSI, Protonix Labs reviewed: Sodium 132, Potassium 4.0, BUN 73, Creatinine 2.16, Phosphorus 3.0, Magnesium 1.7, Hgb A1c 7.2 CBG: 82-145 x 24 hrs  NUTRITION - FOCUSED PHYSICAL EXAM:  Deferred to follow-up.   Diet Order:   Diet Order             Diet NPO time specified  Diet effective now           Diet clear liquid Room service appropriate? Yes; Fluid consistency: Thin  Diet effective now                   EDUCATION NEEDS:   No education needs have been identified at this time  Skin:  Skin Assessment: Reviewed RN Assessment  Last BM:  8/29  Height:  Ht Readings from Last 1 Encounters:  01/03/23 5\' 7"  (1.702 m)   Weight:  Wt Readings from Last 1 Encounters:   01/04/23 121.7 kg   Ideal Body Weight:  61.4 kg  BMI:  Body mass index is 42.01 kg/m.  Estimated Nutritional Needs:   Kcal:  2050-2250 Protein:  100-120 grams Fluid:  >/= 2 L   Kirby Crigler RD, LDN Clinical Dietitian See [facility] for contact information.

## 2023-01-04 NOTE — Consult Note (Addendum)
Consultation Note   Referring Provider:  [facility] PCP: [name], MD Primary Gastroenterologist:  Dr. Leone Payor       Reason for Consultation: Lower GI bleed DOA: 01/03/2023         Hospital Day: 2  ASSESSMENT & PLAN   88 y.o. year old female with medical history significant of HTN, HLD, diastolic CHF, COPD, esophagitis, DM2, CKD stage IV, PE/DVT, breast cancer. Patient was referred to ER by oncology for anemia and patient reported history of dark stool.  Acute on chronic anemia secondary to possible GI bleed. Hgb 7.0>7.8>8.6. Baseline 8-9. History of IDA following bariatric surgery. Patients daughter reports she is not on iron. Iron 62. Ferritin 65. B12 367. Fecal occult positive. Patient received 1 unit PRBCs on 8/28. Endoscopy 2018. Colonoscopy has been over 20 years.  -Monitor CBC -Clear liquid diet, NPO after MN -Changed Protonix to 40 mg IV BID, should probably continue PPI therapy after discharge -Consider IV iron infusion during this admission -Endoscopic procedures tomorrow per Dr. Marina Goodell  GERD- patient reports she takes OTC Rolaids at home.  Cholelithiasis,asymptomatic normal LFT's, denies RUQ abdominal pain -monitor  CKD stage IV   BUN 77>75>73, Creat 2.22> 2.32> 2.16. Baseline 2.3. -managed   Hx of DVT/PE (2023)- on Eliquis. Last dose reported 8/28 am. -On hold  CHF. (2/23) EF 55 to 60%. Pt on Demadex. -managed by cardiology  Breast CA - patient has not been able to take Arimidex . -Managed by Oncology  Additional co-morbidities include: COPD - on symbicort, no oxygen ,DM2- on insulin. Ha1c 7.2, mild dementia- on namenda- a&Ox3  HISTORY OF PRESENT ILLNESS   Patient sitting in recliner, daughter at bedside. Patient poor historian, daughter states she was staying with her and she had dark stools for two weeks up until yesterday. She reports she is not taking oral Iron or Pepto Bismul. No NSAID use. She is  on Eliquis for hx of PE/DVT and last dose yesterday morning (8/28). Patient reports she has chronic constipation and taking Linzess po every other day. She has bowel movement every 2-3 days. Admits to epigastric discomfort and heartburn after eating and states she takes OTC Rolaids at home which helps some. Patients daughter reports one episode of hematuria. She saw her nephrologist this week, but her grandson took her so not reported. Endoscopy done with Dr. Leone Payor for dysphagia in 11/2016. Patient reports colonoscopy 20+ years ago.    Previous GI Evaluations   CT ABDOMEN AND PELVIS WITHOUT CONTRAST  (8/28)  IMPRESSION: 1. No acute intra-abdominal or pelvic pathology. 2. Cholelithiasis. 3.  Aortic Atherosclerosis (ICD10-I70.0)  Upper Endoscopy (11/2016)  Impression: The examined esophagus was moderately tortuous. A TTS dilator was passed through the scope. Dilation with a 15- 16. 5- 18 mm balloon dilator was performed to 18 mm. I withdrew balloon inflated at 18 mm - no difficulty and no heme.  A benign- appearing, intrinsic mild stenosis was found in the gastric body. This was traversed. It was related to takedown and reversal of prior gastric bypass - > 20 mm lumen. Some pill granules trapped above it.  The exam was otherwise without abnormality.  The cardia and gastric fundus were otherwise normal post- op on retroflexion.  Labs  and Imaging:  CT CHEST WITHOUT CONTRAST  (8/23)  IMPRESSION: 1. No acute findings in the chest or evidence interstitial lung disease. 2. Coronary artery calcifications aortic Atherosclerosis  PORTABLE CHEST 1 VIEW (8/23)  IMPRESSION: 1. Increased ill-defined interstitial opacities, worst at the lung bases, concerning for atypical infection or mild interstitial pulmonary edema.    Echo (06/2021) Left ventricular ejection fraction, by estimation, is 55 to 60%.   Recent Labs    01/03/23 1415 01/03/23 1635 01/04/23 0425  WBC 6.4 7.6 8.3   HGB 7.1* 7.0* 7.8*  HCT 23.1* 23.4* 24.8*  PLT 216 213 194   Recent Labs    01/03/23 1415 01/03/23 1635 01/04/23 0425  NA 138 137 132*  K 4.3 4.6 4.0  CL 105 103 101  CO2 29 25 22   GLUCOSE 144* 142* 117*  BUN 77* 75* 73*  CREATININE 2.22* 2.32* 2.16*  CALCIUM 8.5* 8.7* 7.8*   Recent Labs    01/04/23 0425  PROT 6.3*  ALBUMIN 2.4*  AST 20  ALT 15  ALKPHOS 66  BILITOT 0.8   No results for input(s): "HEPBSAG", "HCVAB", "HEPAIGM", "HEPBIGM" in the last 72 hours. No results for input(s): "LABPROT", "INR" in the last 72 hours.    Past Medical History:  Diagnosis Date   Abdominal pain 01/26/2012   Acute deep vein thrombosis (DVT) of femoral vein of right lower extremity (HCC) 03/26/2022   Acute kidney injury superimposed on chronic kidney disease (HCC) 12/18/2014   Acute upper GI bleed 03/26/2022   Allergy    takes Mucinex daily as needed   Anemia, unspecified    Anxiety    takes Clonazepam daily as needed   Anxiety associated with depression 06/08/2017   Arthritis    Breast cancer (HCC)    Breast cancer screening 02/19/2014   Breast mass 10/27/2014   Chronic diastolic CHF (congestive heart failure) (HCC)    takes Furosemide daily   Chronic pain syndrome 02/06/2015   CKD (chronic kidney disease), stage III (HCC)    Coronary artery disease    a. s/p IMI 2004 tx with BMS to RCA;  b. s/p Promus DES to RCA 2/12 c. abnormal nuc 2016 -> cath with med rx. d. NSTEMI 02/2020  mv CAD with severe ISR in pRCA and m/dRCA lesion both treated with DES.   Debility 08/01/2012   Dementia (HCC)    Depression    takes Cymbalta daily   Diabetes mellitus    insulin daily   DOE (dyspnea on exertion) 06/24/2014   Dysphagia, unspecified(787.20) 07/31/2012   Fungal dermatitis 11/13/2007   Qualifier: Diagnosis of  By: Amador Cunas  MD, Janett Labella    GERD (gastroesophageal reflux disease)    takes Protonix daily   Gout, unspecified    Headache    occasionally   History of anemia due  to chronic kidney disease 03/26/2022   History of blood clots    History of deep venous thrombosis 01/27/2012   History of pulmonary embolus (PE) 04/22/2014   Hyperkalemia 07/26/2017   Hyperlipidemia    takes Pravastatin daily   Hypertension    Insomnia    Joint pain    Joint swelling    Morbid obesity (HCC)    Multiple benign nevi 11/15/2016   Myocardial infarction (HCC) 2004   Non-STEMI (non-ST elevated myocardial infarction) (HCC) 02/15/2020   Numbness    Obstructive sleep apnea    does not wear cpap   Osteoarthritis    Osteoarthritis    Osteoarthrosis, unspecified  whether generalized or localized, lower leg    Pain in the chest 12/31/2013   Pain, chronic    Personal history of other diseases of the digestive system 12/03/2006   Qualifier: Diagnosis of  By: Amador Cunas  MD, Janett Labella    Polymyalgia rheumatica [facility])    Presence of stent in right coronary artery    Pulmonary emboli (HCC) 01/2012   felt to need lifelong anticoagulation but Xarelto dc in 02/2020 due to need for DAPT   Situational depression 06/04/2009   Qualifier: Diagnosis of  By: Amador Cunas  MD, Janett Labella    Syncope, vasovagal 07/04/2021   Type 2 diabetes mellitus with hyperlipidemia (HCC) 02/16/2022   Urinary incontinence    takes Linzess daily   Vaginitis and vulvovaginitis 06/23/2016   Vertigo    hx of;was taking Meclizine if needed   Wears dentures     Past Surgical History:  Procedure Laterality Date   ABDOMINAL HYSTERECTOMY     partial   APPENDECTOMY     blood clots/legs and lungs  2013   BREAST BIOPSY Left 07/22/2014   BREAST BIOPSY Left 02/10/2013   BREAST LUMPECTOMY Left 11/05/2014   BREAST LUMPECTOMY WITH RADIOACTIVE SEED LOCALIZATION Left 11/05/2014   Procedure: LEFT BREAST LUMPECTOMY WITH RADIOACTIVE SEED LOCALIZATION;  Surgeon: Abigail Miyamoto, MD;  Location: [facility] OR;  Service: General;  Laterality: Left;   CARDIAC CATHETERIZATION     COLONOSCOPY     CORONARY ANGIOPLASTY  2   CORONARY  STENT INTERVENTION N/A 02/17/2020   Procedure: CORONARY STENT INTERVENTION;  Surgeon: Marykay Lex, MD;  Location: [facility] INVASIVE CV LAB;  Service: Cardiovascular;  Laterality: N/A;   ESOPHAGOGASTRODUODENOSCOPY (EGD) WITH PROPOFOL N/A 11/07/2016   Procedure: ESOPHAGOGASTRODUODENOSCOPY (EGD) WITH PROPOFOL;  Surgeon: Iva Boop, MD;  Location: [facility] ENDOSCOPY;  Service: Endoscopy;  Laterality: N/A;   EXCISION OF SKIN TAG Right 11/05/2014   Procedure: EXCISION OF RIGHT EYELID SKIN TAG;  Surgeon: Abigail Miyamoto, MD;  Location: [facility] OR;  Service: General;  Laterality: Right;   EYE SURGERY Bilateral    cataract    GASTRIC BYPASS  1977    reversed in [facility], [facility]   LEFT HEART CATH AND CORONARY ANGIOGRAPHY N/A 02/17/2020   Procedure: LEFT HEART CATH AND CORONARY ANGIOGRAPHY;  Surgeon: Marykay Lex, MD;  Location: [facility] INVASIVE CV LAB;  Service: Cardiovascular;  Laterality: N/A;   LEFT HEART CATHETERIZATION WITH CORONARY ANGIOGRAM N/A 06/29/2014   Procedure: LEFT HEART CATHETERIZATION WITH CORONARY ANGIOGRAM;  Surgeon: Lennette Bihari, MD;  Location: [facility] CATH LAB;  Service: Cardiovascular;  Laterality: N/A;   MEMBRANE PEEL Right 10/23/2018   Procedure: MEMBRANE PEEL;  Surgeon: Edmon Crape, MD;  Location: [facility] OR;  Service: Ophthalmology;  Laterality: Right;   MI with stent placement  2004   PARS PLANA VITRECTOMY Right 10/23/2018   Procedure: PARS PLANA VITRECTOMY WITH 25 GAUGE;  Surgeon: Edmon Crape, MD;  Location: [facility] OR;  Service: Ophthalmology;  Laterality: Right;    Family History  Problem Relation Age of Onset   Breast cancer Mother 52   Heart disease Mother    Throat cancer Father    Hypertension Father    Arthritis Father    Diabetes Father    Arthritis Sister    Obesity Sister    Diabetes Sister    Heart disease Cousin    Colon cancer Neg Hx    Stomach cancer Neg Hx    Esophageal cancer Neg Hx     Prior  to Admission medications   Medication Sig Start Date End Date Taking?  Authorizing Provider  albuterol (PROAIR HFA) 108 (90 Base) MCG/ACT inhaler Inhale 1-2 puffs into the lungs every 6 (six) hours as needed for wheezing or shortness of breath. 12/19/18  Yes Sharon Seller, NP  apixaban (ELIQUIS) 5 MG TABS tablet Take 1 tablet (5 mg total) by mouth 2 (two) times daily. 04/05/22 03/31/23 Yes [name], MD  atorvastatin (LIPITOR) 80 MG tablet TAKE 1 TABLET BY MOUTH EVERY DAY Patient taking differently: Take 80 mg by mouth at bedtime. 12/04/22  Yes [name], MD  benzonatate (TESSALON) 100 MG capsule Take 1 capsule (100 mg total) by mouth 2 (two) times daily as needed for cough. 09/05/22  Yes [name], MD  carvedilol (COREG) 12.5 MG tablet TAKE 1 TABLET BY MOUTH 2 TIMES DAILY Patient taking differently: Take 12.5 mg by mouth 2 (two) times daily with a meal. 12/05/22  Yes [name], MD  cetirizine (ZYRTEC) 10 MG tablet Take 10 mg by mouth daily as needed for allergies or rhinitis.   Yes [provider]  Continuous Glucose Sensor (FREESTYLE LIBRE 14 DAY SENSOR) MISC PLACE 1 DEVICE ON THE SKIN AS DIRECTED EVERY 14 DAYS Patient taking differently: Inject 1 Device into the skin every 14 (fourteen) days. 11/27/22  Yes Shamleffer, Konrad Dolores, MD  diclofenac Sodium (VOLTAREN) 1 % GEL Apply 2 g topically 4 (four) times daily. Patient taking differently: Apply 2 g topically 4 (four) times daily as needed (pain). 02/23/22  Yes [name], MD  DULoxetine (CYMBALTA) 30 MG capsule Take 30 mg by mouth daily.   Yes [provider]  febuxostat (ULORIC) 40 MG tablet Take 1 tablet (40 mg total) by mouth daily. 04/04/22 03/30/23 Yes [name], MD  fluticasone (FLONASE) 50 MCG/ACT nasal spray Place 2 sprays into both nostrils daily for 14 days. Patient taking differently: Place 2 sprays into both nostrils every other day. 11/29/22 01/03/23 Yes Salvatore Decent, FNP  folic acid (FOLVITE) 1 MG tablet TAKE 1 TABLET BY MOUTH EVERY  DAY Patient taking differently: Take 1 mg by mouth daily. 12/05/22  Yes [name], MD  glucagon 1 MG injection Inject 1 mg into the vein once as needed for up to 1 dose. 04/13/22  Yes [name], MD  glucose 4 GM chewable tablet Chew 1 tablet (4 g total) by mouth as needed for low blood sugar. 04/05/22  Yes [name], MD  insulin glargine, 2 Unit Dial, (TOUJEO MAX SOLOSTAR) 300 UNIT/ML Solostar Pen Inject 40 Units into the skin daily. Patient assistance program provides. Patient reports 40 units 04/24/2022 Patient taking differently: Inject 34 Units into the skin daily before breakfast. 05/31/22  Yes Shamleffer, Konrad Dolores, MD  linaclotide (LINZESS) 145 MCG CAPS capsule Patient take 1 capsule daily.  [name] add an additional capsule for breakthrough constipation associated with opioid use Patient taking differently: Take 145 mcg by mouth every other day. 12/19/22  Yes [name], MD  meclizine (ANTIVERT) 25 MG tablet Take 1-2 tablets (25-50 mg total) by mouth 2 (two) times daily as needed for dizziness. 06/13/22  Yes [name], MD  memantine (NAMENDA) 5 MG tablet TAKE 2 TABLETS BY MOUTH EVERY MORNING and TAKE 1 TABLET BY MOUTH EVERY EVENING Patient taking differently: Take 5-10 mg by mouth See admin instructions. Take 10 mg by mouth in the morning and 5 mg in the evening 10/10/22  Yes Janee Morn,  Ebbie Ridge, MD  nitroGLYCERIN (NITROSTAT) 0.4 MG SL tablet Take one tablet under the tongue every 5 minutes as needed for chest pain Patient taking differently: Place 0.4 mg under the tongue every 5 (five) minutes as needed for chest pain. 09/24/13  Yes Sharon Seller, NP  NOVOLOG FLEXPEN 100 UNIT/ML FlexPen Inject 14 Units into the skin 2 (two) times daily with a meal. Max daily dose 70 units 04/03/22  Yes Shamleffer, Konrad Dolores, MD  ondansetron (ZOFRAN) 4 MG tablet Take 1 tablet (4 mg total) by mouth every 8 (eight) hours as needed for nausea or vomiting. 03/14/22  Yes  [name], MD  oxyCODONE-acetaminophen (PERCOCET) 7.5-325 MG tablet Take 1 tablet by mouth 2 (two) times daily as needed (for pain).   Yes [provider]  pantoprazole (PROTONIX) 20 MG tablet TAKE 1 TABLET BY MOUTH EVERY DAY Patient taking differently: Take 20 mg by mouth daily before breakfast. 10/23/22  Yes [name], MD  Semaglutide,0.25 or 0.5MG /DOS, (OZEMPIC, 0.25 OR 0.5 MG/DOSE,) 2 MG/3ML SOPN Inject 0.5 mg into the skin once a week. Patient taking differently: Inject 0.5 mg into the skin every Monday. 08/21/22  Yes Shamleffer, Konrad Dolores, MD  SYMBICORT 160-4.5 MCG/ACT inhaler Inhale 2 puffs by mouth twice daily Patient taking differently: Inhale 2 puffs into the lungs 2 (two) times daily as needed (for flares). 05/12/22  Yes Sharon Seller, NP  torsemide (DEMADEX) 20 MG tablet TAKE 1 TABLET BY MOUTH EVERY DAY TAKE AN EXTRA DOES FOR WEIGHT GAIN, 3LBs IN 1 DAY OR 5LBS IN 1 WEEK Patient taking differently: Take 20 mg by mouth. Take 20 mg by mouth in the morning and an additional 20 mg for a weight gain of 3 pounds in one day or 5 pounds in one week 01/02/23  Yes [name], MD  traZODone (DESYREL) 50 MG tablet Take 50 mg by mouth at bedtime.   Yes [provider]  TYLENOL 500 MG tablet Take 1,000 mg by mouth every 6 (six) hours as needed for mild pain or headache.   Yes [provider]  TYLENOL PM EXTRA STRENGTH 500-25 MG TABS tablet Take 2 tablets by mouth at bedtime.   Yes [provider]  Vitamin D, Ergocalciferol, (DRISDOL) 1.25 MG (50000 UNIT) CAPS capsule TAKE 1 CAPSULE BY MOUTH ONCE WEEKLY Patient taking differently: Take 50,000 Units by mouth every Monday. 01/02/23  Yes [name], MD  docusate sodium (COLACE) 100 MG capsule Take 2 capsules (200 mg total) by mouth daily. Patient not taking: Reported on 01/03/2023 12/19/22 12/14/23  [name], MD  Elastic Bandages & Supports (B & B CARPAL TUNNEL BRACE) MISC Use  brace on wrist as needed for wrist pain 04/24/22   [name], MD  montelukast (SINGULAIR) 10 MG tablet Take 1 tablet (10 mg total) by mouth at bedtime. Patient not taking: Reported on 01/03/2023 04/24/22   [name], MD  nystatin cream (MYCOSTATIN) Apply 1 Application topically 2 (two) times daily. Patient not taking: Reported on 01/03/2023 02/16/22   Bufford Spikes L, DO  sodium polystyrene (SPS) 15 GM/60ML suspension TAKE 60 MLS BY MOUTH ONCE WEEKLY TO KEEP POTASSIUM DOWN Patient not taking: Reported on 01/03/2023 07/21/22   [name], MD    Current Facility-Administered Medications  Medication Dose Route Frequency Provider Last Rate Last Admin   0.9 %  sodium chloride infusion (Manually program via Guardrails IV Fluids)   Intravenous Once Therisa Doyne, MD  Stopped at 01/04/23 0235   0.9 %  sodium chloride infusion   Intravenous Continuous Therisa Doyne, MD 75 mL/hr at 01/03/23 2336 New Bag at 01/03/23 2336   acetaminophen (TYLENOL) tablet 650 mg  650 mg Oral Q6H PRN Therisa Doyne, MD   650 mg at 01/03/23 2317   Or   acetaminophen (TYLENOL) suppository 650 mg  650 mg Rectal Q6H PRN Doutova, Anastassia, MD       albuterol (PROVENTIL) (2.5 MG/3ML) 0.083% nebulizer solution 2.5 mg  2.5 mg Nebulization Q2H PRN Doutova, Anastassia, MD       HYDROcodone-acetaminophen (NORCO/VICODIN) 5-325 MG per tablet 1-2 tablet  1-2 tablet Oral Q4H PRN Therisa Doyne, MD   1 tablet at 01/04/23 0231   insulin aspart (novoLOG) injection 0-9 Units  0-9 Units Subcutaneous Q4H Therisa Doyne, MD   1 Units at 01/03/23 2021   insulin glargine-yfgn (SEMGLEE) injection 30 Units  30 Units Subcutaneous Daily Doutova, Anastassia, MD       memantine (NAMENDA) tablet 10 mg  10 mg Oral Daily Doutova, Anastassia, MD       And   memantine (NAMENDA) tablet 5 mg  5 mg Oral QHS Doutova, Anastassia, MD   5 mg at 01/03/23 2317   mometasone-formoterol (DULERA) 200-5 MCG/ACT inhaler 2  puff  2 puff Inhalation BID Therisa Doyne, MD   2 puff at 01/04/23 0810   montelukast (SINGULAIR) tablet 10 mg  10 mg Oral QHS Doutova, Jonny Ruiz, MD   10 mg at 01/03/23 2317   ondansetron (ZOFRAN) tablet 4 mg  4 mg Oral Q6H PRN Therisa Doyne, MD       Or   ondansetron (ZOFRAN) injection 4 mg  4 mg Intravenous Q6H PRN Therisa Doyne, MD       [START ON 01/07/2023] pantoprazole (PROTONIX) injection 40 mg  40 mg Intravenous Q12H Doutova, Anastassia, MD       pantoprozole (PROTONIX) 80 mg /NS 100 mL infusion  8 mg/hr Intravenous Continuous Doutova, Anastassia, MD 10 mL/hr at 01/03/23 2336 8 mg/hr at 01/03/23 2336   traZODone (DESYREL) tablet 25 mg  25 mg Oral QHS Therisa Doyne, MD   25 mg at 01/03/23 2317    Allergies as of 01/03/2023 - Review Complete 01/03/2023  Allergen Reaction Noted   Sulfonamide derivatives Swelling and Other (See Comments)    Duloxetine  12/19/2022   Lokelma [sodium zirconium cyclosilicate] Other (See Comments) 09/05/2021   Aricept Palma Holter hcl] Diarrhea, Nausea And Vomiting, and Other (See Comments) 01/16/2017   Tramadol Nausea And Vomiting 09/17/2014    Social History   Socioeconomic History   Marital status: Widowed    Spouse name: Not on file   Number of children: 3   Years of education: Not on file   Highest education level: High school graduate  Occupational History   Occupation: retired  Tobacco Use   Smoking status: Never    Passive exposure: Past   Smokeless tobacco: Never   Tobacco comments:    Both parents smoked, patient was exposed/ "Raised up in smoke, my whole life."  Vaping Use   Vaping status: Never Used  Substance and Sexual Activity   Alcohol use: No    Alcohol/week: 0.0 standard drinks of alcohol   Drug use: No   Sexual activity: Not on file  Other Topics Concern   Not on file  Social History Narrative   Not on file   Social Determinants of Health   Financial Resource Strain: Low Risk  (06/05/2022)  Overall Financial Resource Strain (CARDIA)    Difficulty of Paying Living Expenses: Not hard at all  Food Insecurity: No Food Insecurity (01/03/2023)   Hunger Vital Sign    Worried About Running Out of Food in the Last Year: Never true    Ran Out of Food in the Last Year: Never true  Transportation Needs: No Transportation Needs (01/03/2023)   PRAPARE - Administrator, Civil Service (Medical): No    Lack of Transportation (Non-Medical): No  Physical Activity: Insufficiently Active (06/05/2022)   Exercise Vital Sign    Days of Exercise per Week: 3 days    Minutes of Exercise per Session: 10 min  Stress: Stress Concern Present (06/05/2022)   [employer] of Occupational Health - Occupational Stress Questionnaire    Feeling of Stress : Rather much  Social Connections: Moderately Isolated (04/20/2022)   Social Connection and Isolation Panel [NHANES]    Frequency of Communication with Friends and Family: Once a week    Frequency of Social Gatherings with Friends and Family: Once a week    Attends Religious Services: 1 to 4 times per year    Active Member of [employer] or Organizations: Yes    Attends Banker Meetings: 1 to 4 times per year    Marital Status: Widowed  Intimate Partner Violence: Not At Risk (01/03/2023)   Humiliation, Afraid, Rape, and Kick questionnaire    Fear of Current or Ex-Partner: No    Emotionally Abused: No    Physically Abused: No    Sexually Abused: No    Code Status   Code Status: Limited: Do not attempt resuscitation (DNR) -DNR-LIMITED -Do Not Intubate/DNI   Review of Systems: All systems reviewed and negative except where noted in HPI.  Physical Exam: Vital signs in last 24 hours: Temp:  [97.7 F (36.5 C)-98.4 F (36.9 C)] 97.9 F (36.6 C) (08/29 0358) Pulse Rate:  [61-75] 61 (08/29 0358) Resp:  [10-21] 18 (08/29 0358) BP: (108-137)/(37-71) 119/52 (08/29 0358) SpO2:  [96 %-100 %] 96 % (08/29 0810) Weight:  [121.7 kg] 121.7 kg  (08/29 0116) Last BM Date : 01/04/23  General:  Pleasant female in NAD Psych:  Cooperative. Normal mood and affect Eyes: Pupils equal Ears:  Normal auditory acuity Nose: No deformity, discharge or lesions Neck:  Supple, no masses felt Lungs:  bilateral wheezing lower lobes, on RA Heart:  Regular rate, regular rhythm.  Abdomen:  Soft, nondistended,obese. Epigastric discomfort, active bowel sounds, no masses felt Rectal :  Deferred Msk: Symmetrical without gross deformities.  Neurologic:  Alert, oriented, grossly normal neurologically Extremities : trace edema BLE Skin:  Intact without significant lesions.    Intake/Output from previous day: 08/28 0701 - 08/29 0700 In: 818.8 [I.V.:276.6; Blood:400; IV Piggyback:142.1] Out: -  Intake/Output this shift:  No intake/output data recorded.  Principal Problem:   Upper GI bleed Active Problems:   Hyperlipidemia   Class 3 obesity (HCC)   Symptomatic anemia   Essential hypertension   History of DVT (deep vein thrombosis)   Chronic diastolic CHF (congestive heart failure) (HCC)   Long term current use of anticoagulant therapy   COPD with chronic bronchitis   Dementia (HCC)   Type 2 diabetes mellitus with hyperlipidemia (HCC)   Breast cancer (HCC)   CKD (chronic kidney disease), stage IV (HCC)   CAD (coronary artery disease)   Epigastric abdominal tenderness    Nichole May, NP-C @  01/04/2023, 9:05 AM  GI ATTENDING  History, laboratories, x-rays,  prior endoscopy reports all personally reviewed.  Patient seen and examined.  Multiple family members in room.  Agree with comprehensive consultation note as outlined above.  Elderly female with multiple significant medical problems on chronic anticoagulation is sent to the hospital with worsening of her chronic anemia.  Reported positive stools.  Hemoccult positive.  Prior history of gastric bypass.  Last EGD 2018 with esophageal dilation.  Last colonoscopy 2010 was normal.  Good response  to transfusion of blood with 1 unit.  Given her need to go back on anticoagulation, I think it is reasonable to pursue upper endoscopy with report of transient dark stools.  She is high risk.  I do not think she is fit for colonoscopy.  I discussed this with the patient and the family.The nature of the procedure, as well as the risks, benefits, and alternatives were carefully and thoroughly reviewed with the patient. Ample time for discussion and questions allowed. The patient understood, was satisfied, and agreed to proceed.  The procedure tentatively scheduled for tomorrow at 2:15 PM.  Wilhemina Bonito. Eda Keys., M.D. [facility] Division of Gastroenterology

## 2023-01-05 ENCOUNTER — Encounter (HOSPITAL_COMMUNITY): Payer: Self-pay | Admitting: Internal Medicine

## 2023-01-05 ENCOUNTER — Inpatient Hospital Stay (HOSPITAL_COMMUNITY): Payer: PPO | Admitting: Anesthesiology

## 2023-01-05 ENCOUNTER — Encounter (HOSPITAL_COMMUNITY)
Admission: EM | Disposition: A | Discharge status: Home / Self Care | Payer: Self-pay | Source: Home / Self Care | Attending: Internal Medicine

## 2023-01-05 DIAGNOSIS — I5032 Chronic diastolic (congestive) heart failure: Secondary | ICD-10-CM | POA: Diagnosis not present

## 2023-01-05 DIAGNOSIS — D500 Iron deficiency anemia secondary to blood loss (chronic): Secondary | ICD-10-CM

## 2023-01-05 DIAGNOSIS — K625 Hemorrhage of anus and rectum: Secondary | ICD-10-CM

## 2023-01-05 DIAGNOSIS — I130 Hypertensive heart and chronic kidney disease with heart failure and stage 1 through stage 4 chronic kidney disease, or unspecified chronic kidney disease: Secondary | ICD-10-CM | POA: Diagnosis not present

## 2023-01-05 DIAGNOSIS — N184 Chronic kidney disease, stage 4 (severe): Secondary | ICD-10-CM

## 2023-01-05 DIAGNOSIS — R195 Other fecal abnormalities: Secondary | ICD-10-CM

## 2023-01-05 DIAGNOSIS — K449 Diaphragmatic hernia without obstruction or gangrene: Secondary | ICD-10-CM

## 2023-01-05 DIAGNOSIS — K922 Gastrointestinal hemorrhage, unspecified: Secondary | ICD-10-CM | POA: Diagnosis not present

## 2023-01-05 HISTORY — PX: ESOPHAGOGASTRODUODENOSCOPY (EGD) WITH PROPOFOL: SHX5813

## 2023-01-05 LAB — BASIC METABOLIC PANEL
Anion gap: 7 (ref 5–15)
BUN: 59 mg/dL — ABNORMAL HIGH (ref 8–23)
CO2: 22 mmol/L (ref 22–32)
Calcium: 8 mg/dL — ABNORMAL LOW (ref 8.9–10.3)
Chloride: 107 mmol/L (ref 98–111)
Creatinine, Ser: 2 mg/dL — ABNORMAL HIGH (ref 0.44–1.00)
GFR, Estimated: 24 mL/min — ABNORMAL LOW (ref >60)
Glucose, Bld: 83 mg/dL (ref 70–99)
Potassium: 4.7 mmol/L (ref 3.5–5.1)
Sodium: 136 mmol/L (ref 135–145)

## 2023-01-05 LAB — GLUCOSE, CAPILLARY
Glucose-Capillary: 81 mg/dL (ref 70–99)
Glucose-Capillary: 92 mg/dL (ref 70–99)
Glucose-Capillary: 99 mg/dL (ref 70–99)
Glucose-Capillary: 125 mg/dL — ABNORMAL HIGH (ref 70–99)
Glucose-Capillary: 253 mg/dL — ABNORMAL HIGH (ref 70–99)

## 2023-01-05 LAB — CBC WITH DIFFERENTIAL/PLATELET
Abs Immature Granulocytes: 0.03 10*3/uL (ref 0.00–0.07)
Basophils Absolute: 0 10*3/uL (ref 0.0–0.1)
Basophils Relative: 0 %
Eosinophils Absolute: 0.3 10*3/uL (ref 0.0–0.5)
Eosinophils Relative: 5 %
HCT: 25.5 % — ABNORMAL LOW (ref 36.0–46.0)
Hemoglobin: 7.7 g/dL — ABNORMAL LOW (ref 12.0–15.0)
Immature Granulocytes: 0 %
Lymphocytes Relative: 26 %
Lymphs Abs: 1.9 10*3/uL (ref 0.7–4.0)
MCH: 27.8 pg (ref 26.0–34.0)
MCHC: 30.2 g/dL (ref 30.0–36.0)
MCV: 92.1 fL (ref 80.0–100.0)
Monocytes Absolute: 0.7 10*3/uL (ref 0.1–1.0)
Monocytes Relative: 9 %
Neutro Abs: 4.4 10*3/uL (ref 1.7–7.7)
Neutrophils Relative %: 60 %
Platelets: 188 10*3/uL (ref 150–400)
RBC: 2.77 MIL/uL — ABNORMAL LOW (ref 3.87–5.11)
RDW: 15.1 % (ref 11.5–15.5)
WBC: 7.3 10*3/uL (ref 4.0–10.5)
nRBC: 0 % (ref 0.0–0.2)

## 2023-01-05 LAB — PREPARE RBC (CROSSMATCH)

## 2023-01-05 SURGERY — ESOPHAGOGASTRODUODENOSCOPY (EGD) WITH PROPOFOL
Anesthesia: Monitor Anesthesia Care

## 2023-01-05 MED ORDER — PROPOFOL 500 MG/50ML IV EMUL
INTRAVENOUS | Status: DC | PRN
  Administered 2023-01-05: 75 ug/kg/min via INTRAVENOUS

## 2023-01-05 MED ORDER — APIXABAN 5 MG PO TABS
5.0000 mg | ORAL_TABLET | Freq: Two times a day (BID) | ORAL | Status: DC
  Administered 2023-01-05 – 2023-01-06 (×2): 5 mg via ORAL
  Filled 2023-01-05 (×2): qty 1

## 2023-01-05 MED ORDER — DOCUSATE SODIUM 100 MG PO CAPS
100.0000 mg | ORAL_CAPSULE | Freq: Every day | ORAL | Status: DC | PRN
  Administered 2023-01-05: 100 mg via ORAL
  Filled 2023-01-05: qty 1

## 2023-01-05 MED ORDER — LACTATED RINGERS IV SOLN
INTRAVENOUS | Status: DC
  Administered 2023-01-05: 1000 mL via INTRAVENOUS

## 2023-01-05 MED ORDER — MELATONIN 5 MG PO TABS
5.0000 mg | ORAL_TABLET | Freq: Every evening | ORAL | Status: DC | PRN
  Administered 2023-01-05: 5 mg via ORAL
  Filled 2023-01-05: qty 1

## 2023-01-05 MED ORDER — SODIUM CHLORIDE 0.9% IV SOLUTION: Freq: Once | INTRAVENOUS | Status: DC

## 2023-01-05 MED ORDER — LACTATED RINGERS IV SOLN: INTRAVENOUS | Status: DC | PRN

## 2023-01-05 MED ORDER — SODIUM CHLORIDE 0.9 % IV SOLN: INTRAVENOUS | Status: DC

## 2023-01-05 SURGICAL SUPPLY — 15 items

## 2023-01-05 NOTE — Anesthesia Preprocedure Evaluation (Addendum)
Anesthesia Evaluation  Patient identified by MRN, date of birth, ID band Patient awake    Reviewed: Allergy & Precautions, NPO status , Patient's Chart, lab work & pertinent test results  History of Anesthesia Complications Negative for: history of anesthetic complications  Airway Mallampati: II  TM Distance: >3 FB Neck ROM: Full    Dental  (+) Upper Dentures, Partial Upper,    Pulmonary sleep apnea , COPD   Pulmonary exam normal        Cardiovascular hypertension, + CAD, + Past MI (2004), + Cardiac Stents (last in 2021) and +CHF  Normal cardiovascular exam  TTE 2023: EF 55-60%, grade 1 DD, valves ok    Neuro/Psych  Headaches  Anxiety Depression   Dementia    GI/Hepatic Neg liver ROS,GERD  ,,  Endo/Other  diabetes, Type 2    Renal/GU Renal InsufficiencyRenal disease (Cr 2.0)     Musculoskeletal  (+) Arthritis ,    Abdominal   Peds  Hematology  (+) Blood dyscrasia (Hgb 7.7), anemia   Anesthesia Other Findings Day of surgery medications reviewed with patient.  Reproductive/Obstetrics                             Anesthesia Physical Anesthesia Plan  ASA: 3  Anesthesia Plan: MAC   Post-op Pain Management: Minimal or no pain anticipated   Induction:   PONV Risk Score and Plan: 2 and Treatment may vary due to age or medical condition and Propofol infusion  Airway Management Planned: Natural Airway and Nasal Cannula  Additional Equipment: None  Intra-op Plan:   Post-operative Plan:   Informed Consent: I have reviewed the patients History and Physical, chart, labs and discussed the procedure including the risks, benefits and alternatives for the proposed anesthesia with the patient or authorized representative who has indicated his/her understanding and acceptance.   Patient has DNR.  Discussed DNR with patient and Continue DNR.     Plan Discussed with: CRNA  Anesthesia Plan  Comments: (DNR status discussed with patient. She would like to remain DNR periprocedurally. Stephannie Peters, MD)        Anesthesia Quick Evaluation

## 2023-01-05 NOTE — Interval H&P Note (Signed)
History and Physical Interval Note:  01/05/2023 1:29 PM  [name]  has presented today for surgery, with the diagnosis of anemia, rectal bleeding.  The various methods of treatment have been discussed with the patient and family. After consideration of risks, benefits and other options for treatment, the patient has consented to  Procedure(s): ESOPHAGOGASTRODUODENOSCOPY (EGD) WITH PROPOFOL (N/A) as a surgical intervention.  [name] history has been reviewed, patient examined, no change in status, stable for surgery.  I have reviewed the patient's chart and labs.  Questions were answered to the patient's satisfaction.     [name]

## 2023-01-05 NOTE — Transfer of Care (Signed)
Immediate Anesthesia Transfer of Care Note  Patient: [name]  Procedure(s) Performed: ESOPHAGOGASTRODUODENOSCOPY (EGD) WITH PROPOFOL  Patient Location: PACU  Anesthesia Type:MAC  Level of Consciousness: awake and alert   Airway & Oxygen Therapy: Patient Spontanous Breathing and Patient connected to nasal cannula oxygen  Post-op Assessment: Report given to RN  Post vital signs: Reviewed and stable  Last Vitals:  Vitals Value Taken Time  BP 85/68 01/05/23 1431  Temp 36.4 C 01/05/23 1428  Pulse 81 01/05/23 1432  Resp 16 01/05/23 1432  SpO2 100 % 01/05/23 1432  Vitals shown include unfiled device data.  Last Pain:  Vitals:   01/05/23 1430  TempSrc:   PainSc: 0-No pain         Complications: No notable events documented.

## 2023-01-05 NOTE — Progress Notes (Addendum)
PROGRESS NOTE  [name]  DOB: [birth year]  PCP: [name], MD QIH:[id number]  DOA: 01/03/2023  LOS: 2 days  Hospital Day: 3  Brief narrative: [name] is a 88 y.o. female with PMH significant for breast cancer, morbid obesity, OSA, DM2, HTN, HLD, CAD/stents, CHF, CKD, chronic anemia, DVT/PE on chronic anticoagulation, anxiety/depression, dementia, osteoarthritis, polymyalgia rheumatica, esophagitis.  8/28, patient was seen at cancer center with complaint of lightheadedness, dark stool for 7 days and maroon stool for 2 days. Labs showed hemoglobin low at 7 and was sent to the ED Hemoccult positive in the ED Ordered a unit of PRBC transfusion Started on Protonix drip Admitted to [facility] GI consulted  Subjective: Patient was seen and examined this morning. Not in distress.  Was waiting for EGD which she underwent later this afternoon. Daughter at bedside.  Assessment and plan: Acute GI bleeding H/o GERD  Presented with melena, maroon stools Hemoglobin low at 7 compared to baseline between 9 and 10 FOBT was positive Started on Protonix.  GI was consulted.   Today, underwent EGD which was unremarkable. No further workup planned per GI.  okay to resume anticoagulation.  To continue Protonix 40 mg daily indefinitely.  Symptomatic anemia Initially, hemoglobin was low at 7.  1 unit PRBC transfusion given.  Hemoglobin at 7.7 today.   Per GI rec, I would order 1 monitor PRBC transfusion today. Recent Labs    01/17/22 1340 02/08/22 1557 05/05/22 1624 07/11/22 1253 09/12/22 1557 01/03/23 1415 01/03/23 1416 01/03/23 1635 01/04/23 0425 01/04/23 1015 01/04/23 1416 01/04/23 1920 01/05/23 0100  HGB 9.2*   < > 8.5* 8.7*   < > 7.1*  --  7.0* 7.8* 8.6* 9.0* 8.0* 7.7*  MCV 93.7   < > 91.4 93.0   < > 91.3  --  92.9 91.5 91.1 94.0 93.3 92.1  VITAMINB12  --   --  484  --   --   --   --   --  367  --   --   --   --   FOLATE  --   --  >24.0  --   --   --   --   --  38.5   --   --   --   --   FERRITIN 245  --  254 281  --   --  77  --  65  --   --   --   --   TIBC 217*  --  193* 239*  --  193*  --   --  165*  --   --   --   --   IRON 43  --  33* 27*  --  15*  --   --  62  --   --   --   --   RETICCTPCT  --   --   --   --   --   --   --  2.0  --   --   --   --   --    < > = values in this interval not displayed.   H/o DVT/PE 2023 Chronically anticoagulated on Eliquis.  Kept on hold.  Per GI, okay to resume today.  CKD 4 Creatinine at baseline above 2.  Currently remains at baseline. Recent Labs    03/27/22 0035 03/29/22 0458 05/05/22 1624 07/11/22 1253 09/12/22 1557 12/29/22 1239 01/03/23 1415 01/03/23 1635 01/04/23 0425 01/05/23 0102  BUN 53* 41* 46*  56* 86* 100* 77* 75* 73* 59*  CREATININE 2.01* 2.00* 1.53* 1.97* 2.34* 2.76* 2.22* 2.32* 2.16* 2.00*  CO2 28 27 26 28 23  21* 29 25 22 22    Hyponatremia Likely because of poor intake.  Continue monitor Recent Labs  Lab 01/03/23 1415 01/03/23 1635 01/04/23 0425 01/05/23 0102  NA 138 137 132* 136   Hypomagnesemia  Magnesium level was low.  Improved with replacement. Recent Labs  Lab 01/03/23 1415 01/03/23 1635 01/04/23 0425 01/05/23 0102  K 4.3 4.6 4.0 4.7  MG  --  1.5* 1.7  --   PHOS  --  3.2 3.0  --    Type 2 diabetes mellitus A1c 7.2 on 01/03/2023 PTA meds- Ozempic weekly, Lantus 34 units daily, NovoLog 14 units twice daily, Currently blood sugar was running lower than 100 only on sliding scale insulin. Continue SSI/Accu-Cheks Recent Labs  Lab 01/04/23 2039 01/04/23 2359 01/05/23 0421 01/05/23 0730 01/05/23 1148  GLUCAP 181* 93 81 92 99   Hypertension Diastolic CHF PTA meds-carvedilol 12.5 mg twice daily, torsemide daily, Currently both on hold.  Blood pressure and heart rate are both stable.  CAD/stents HLD PTA meds-carvedilol, Eliquis, Lipitor Continue Lipitor  Dementia Continue Namenda  COPD with bronchitis Bronchodilators  Breast cancer Follows with Dr.  Candise Che.    Morbid Obesity  Body mass index is 42.01 kg/m. Patient has been advised to make an attempt to improve diet and exercise patterns to aid in weight loss.  Anxiety/depression PTA meds - Cymbalta 30 mg daily    Mobility: PT eval obtained.  Home health PT recommended.  Goals of care   Code Status: Limited: Do not attempt resuscitation (DNR) -DNR-LIMITED -Do Not Intubate/DNI      DVT prophylaxis:  SCDs Start: 01/03/23 2226 apixaban (ELIQUIS) tablet 5 mg   Antimicrobials: None Fluid: None currently Consultants: GI Family Communication: Daughter at bedside  Status: Inpatient Level of care:  Progressive   Patient is from: Home Needs to continue in-hospital care: Pending 1 more unit of PRBC transfusion today Anticipated d/c to: Hopefully home tomorrow      Diet:  Diet Order             Diet NPO time specified  Diet effective now                   Scheduled Meds:  sodium chloride   Intravenous Once   sodium chloride   Intravenous Once   apixaban  5 mg Oral BID   feeding supplement  1 Container Oral TID BM   insulin aspart  0-9 Units Subcutaneous Q4H   memantine  10 mg Oral Daily   And   memantine  5 mg Oral QHS   mometasone-formoterol  2 puff Inhalation BID   montelukast  10 mg Oral QHS   multivitamin with minerals  1 tablet Oral Daily   pantoprazole  40 mg Intravenous Q12H   traZODone  25 mg Oral QHS    PRN meds: acetaminophen **OR** acetaminophen, albuterol, HYDROcodone-acetaminophen, ondansetron **OR** ondansetron (ZOFRAN) IV   Infusions:      Antimicrobials: Anti-infectives (From admission, onward)    None       Nutritional status:  Body mass index is 42.01 kg/m.  Nutrition Problem: Increased nutrient needs Etiology: chronic illness, cancer and cancer related treatments Signs/Symptoms: estimated needs     Objective: Vitals:   01/05/23 1450 01/05/23 1513  BP: (!) 126/49 (!) 129/47  Pulse: 65 60  Resp: (!) 21 20  Temp:  98.8 F (37.1 C)  SpO2: 100% 100%    Intake/Output Summary (Last 24 hours) at 01/05/2023 1518 Last data filed at 01/05/2023 1424 Gross per 24 hour  Intake 300 ml  Output 400 ml  Net -100 ml   Filed Weights   01/04/23 0116  Weight: 121.7 kg   Weight change:  Body mass index is 42.01 kg/m.   Physical Exam: General exam: Pleasant, elderly.  Not in distress Skin: No rashes, lesions or ulcers. HEENT: Atraumatic, normocephalic, no obvious bleeding Lungs: Clear to auscultation bilaterally CVS: Regular rate and rhythm, no murmur GI/Abd: soft, mild epigastric tenderness present, nondistended, bowel sound. CNS: Alert, awake, oriented x 3 Psychiatry: Mood appropriate Extremities: No pedal edema, no calf tenderness  Data Review: I have personally reviewed the laboratory data and studies available.  F/u labs ordered Unresulted Labs (From admission, onward)     Start     Ordered   01/05/23 1443  Prepare RBC (crossmatch)  (Blood Administration Adult)  Once,   R       Question Answer Comment  # of Units 1 unit   Transfusion Indications Hemoglobin 8 gm/dL or less and orthopedic or cardiac surgery or pre-existing cardiac condition   Number of Units to Keep Ahead NO units ahead   Instructions: Transfuse   If emergent release call blood bank Not emergent release      01/05/23 1442   01/05/23 0500  CBC with Differential/Platelet  Daily,   R      01/04/23 1346   01/05/23 0500  Basic metabolic panel  Daily,   R      01/04/23 1346            Total time spent in review of labs and imaging, patient evaluation, formulation of plan, documentation and communication with family: 45 minutes  Signed, Lorin Glass, MD [facility] 01/05/2023

## 2023-01-05 NOTE — Op Note (Signed)
[facility] Patient Name: [name] Procedure Date: 01/05/2023 MRN: [id number] Attending MD: Wilhemina Bonito. Marina Goodell , MD, [id number] Date of Birth: [birth year] CSN: [id number] Age: 88 Admit Type: Inpatient Procedure:                Upper GI endoscopy Indications:              anemia secondary to chronic blood loss, Heme                            positive stool Providers:                Wilhemina Bonito. Marina Goodell, MD, Jacquelyn "Jaci" [place], RN,                            Martha Clan, RN, Harrington Challenger, Technician Referring MD:             [facility] Medicines:                Monitored Anesthesia Care Complications:            No immediate complications. Estimated Blood Loss:     Estimated blood loss: none. Procedure:                Pre-Anesthesia Assessment:                           - Prior to the procedure, a History and Physical                            was performed, and patient medications and                            allergies were reviewed. [name] tolerance of                            previous anesthesia was also reviewed. The risks                            and benefits of the procedure and the sedation                            options and risks were discussed with the patient.                            All questions were answered, and informed consent                            was obtained. Prior Anticoagulants: The patient has                            taken no anticoagulant or antiplatelet agents. ASA                            Grade Assessment: III - A patient with severe  systemic disease. After reviewing the risks and                            benefits, the patient was deemed in satisfactory                            condition to undergo the procedure.                           After obtaining informed consent, the endoscope was                            passed under direct vision. Throughout the                             procedure, the patient's blood pressure, pulse, and                            oxygen saturations were monitored continuously. The                            [id number]-H190 ([id number]) Olympus endoscope was introduced                            through the mouth, and advanced to the second part                            of duodenum. The upper GI endoscopy was                            accomplished without difficulty. The patient                            tolerated the procedure well. Scope In: Scope Out: Findings:      The esophagus was tortuous but normal.      The stomach was normal save small hiatal hernia. No mucosal lesions. No       bleeding..      The examined duodenum was normal. No bleeding.      The cardia and gastric fundus were normal on retroflexion. Impression:               1. Unremarkable EGD.                           2. No blood or bleeding                           3. Chronic anemia. Moderate Sedation:      none Recommendation:           1. Heart healthy diet                           2. You may want to consider transfusing 1  additional unit of blood, prior to discharge, for                            hemoglobin of 7.7                           3. No further workup plans for GI. She should be                            ready for discharge very soon.                           4. Okay to resume anticoagulation                           5. Would keep her on pantoprazole 40 mg daily                            indefinitely for upper GI mucosal protection                           Will sign off                           . Procedure Code(s):        --- Professional ---                           [phone], Esophagogastroduodenoscopy, flexible,                            transoral; diagnostic, including collection of                            specimen(s) by brushing or washing, when performed                            (separate  procedure) Diagnosis Code(s):        --- Professional ---                           D50.0, Iron deficiency anemia secondary to blood                            loss (chronic)                           R19.5, Other fecal abnormalities CPT copyright 2022 [employer]. All rights reserved. The codes documented in this report are preliminary and upon coder review may  be revised to meet current compliance requirements. Wilhemina Bonito. Marina Goodell, MD 01/05/2023 2:30:24 PM This report has been signed electronically. Number of Addenda: 0

## 2023-01-05 NOTE — Plan of Care (Signed)
  Problem: Skin Integrity: Goal: Risk for impaired skin integrity will decrease Outcome: Progressing   Problem: Activity: Goal: Risk for activity intolerance will decrease Outcome: Progressing   Problem: Safety: Goal: Ability to remain free from injury will improve Outcome: Progressing   Problem: Skin Integrity: Goal: Risk for impaired skin integrity will decrease Outcome: Progressing   

## 2023-01-06 DIAGNOSIS — K922 Gastrointestinal hemorrhage, unspecified: Secondary | ICD-10-CM | POA: Diagnosis not present

## 2023-01-06 LAB — GLUCOSE, CAPILLARY
Glucose-Capillary: 103 mg/dL — ABNORMAL HIGH (ref 70–99)
Glucose-Capillary: 115 mg/dL — ABNORMAL HIGH (ref 70–99)
Glucose-Capillary: 195 mg/dL — ABNORMAL HIGH (ref 70–99)
Glucose-Capillary: 226 mg/dL — ABNORMAL HIGH (ref 70–99)
Glucose-Capillary: 66 mg/dL — ABNORMAL LOW (ref 70–99)

## 2023-01-06 LAB — BASIC METABOLIC PANEL
Anion gap: 8 (ref 5–15)
BUN: 55 mg/dL — ABNORMAL HIGH (ref 8–23)
CO2: 23 mmol/L (ref 22–32)
Calcium: 8.2 mg/dL — ABNORMAL LOW (ref 8.9–10.3)
Chloride: 107 mmol/L (ref 98–111)
Creatinine, Ser: 1.83 mg/dL — ABNORMAL HIGH (ref 0.44–1.00)
GFR, Estimated: 27 mL/min — ABNORMAL LOW (ref >60)
Glucose, Bld: 79 mg/dL (ref 70–99)
Potassium: 4.7 mmol/L (ref 3.5–5.1)
Sodium: 138 mmol/L (ref 135–145)

## 2023-01-06 LAB — CBC WITH DIFFERENTIAL/PLATELET
Abs Immature Granulocytes: 0.05 10*3/uL (ref 0.00–0.07)
Basophils Absolute: 0 10*3/uL (ref 0.0–0.1)
Basophils Relative: 0 %
Eosinophils Absolute: 0.4 10*3/uL (ref 0.0–0.5)
Eosinophils Relative: 4 %
HCT: 27.5 % — ABNORMAL LOW (ref 36.0–46.0)
Hemoglobin: 8.5 g/dL — ABNORMAL LOW (ref 12.0–15.0)
Immature Granulocytes: 1 %
Lymphocytes Relative: 28 %
Lymphs Abs: 2.8 10*3/uL (ref 0.7–4.0)
MCH: 28.6 pg (ref 26.0–34.0)
MCHC: 30.9 g/dL (ref 30.0–36.0)
MCV: 92.6 fL (ref 80.0–100.0)
Monocytes Absolute: 0.7 10*3/uL (ref 0.1–1.0)
Monocytes Relative: 7 %
Neutro Abs: 5.8 10*3/uL (ref 1.7–7.7)
Neutrophils Relative %: 60 %
Platelets: 181 10*3/uL (ref 150–400)
RBC: 2.97 MIL/uL — ABNORMAL LOW (ref 3.87–5.11)
RDW: 14.8 % (ref 11.5–15.5)
WBC: 9.7 10*3/uL (ref 4.0–10.5)
nRBC: 0 % (ref 0.0–0.2)

## 2023-01-06 NOTE — Plan of Care (Signed)
  Problem: Education: Goal: Ability to describe self-care measures that may prevent or decrease complications (Diabetes Survival Skills Education) will improve Outcome: Completed/Met Goal: Individualized Educational Video(s) Outcome: Completed/Met   Problem: Coping: Goal: Ability to adjust to condition or change in health will improve Outcome: Completed/Met   Problem: Fluid Volume: Goal: Ability to maintain a balanced intake and output will improve Outcome: Completed/Met   Problem: Health Behavior/Discharge Planning: Goal: Ability to identify and utilize available resources and services will improve Outcome: Completed/Met Goal: Ability to manage health-related needs will improve Outcome: Completed/Met   Problem: Metabolic: Goal: Ability to maintain appropriate glucose levels will improve Outcome: Completed/Met   Problem: Nutritional: Goal: Maintenance of adequate nutrition will improve Outcome: Completed/Met Goal: Progress toward achieving an optimal weight will improve Outcome: Completed/Met   Problem: Skin Integrity: Goal: Risk for impaired skin integrity will decrease Outcome: Completed/Met   Problem: Tissue Perfusion: Goal: Adequacy of tissue perfusion will improve Outcome: Completed/Met   Problem: Education: Goal: Knowledge of General Education information will improve Description: Including pain rating scale, medication(s)/side effects and non-pharmacologic comfort measures Outcome: Completed/Met   Problem: Health Behavior/Discharge Planning: Goal: Ability to manage health-related needs will improve Outcome: Completed/Met   Problem: Clinical Measurements: Goal: Ability to maintain clinical measurements within normal limits will improve Outcome: Completed/Met Goal: Will remain free from infection Outcome: Completed/Met Goal: Diagnostic test results will improve Outcome: Completed/Met Goal: Respiratory complications will improve Outcome: Completed/Met Goal:  Cardiovascular complication will be avoided Outcome: Completed/Met   Problem: Activity: Goal: Risk for activity intolerance will decrease Outcome: Completed/Met   Problem: Nutrition: Goal: Adequate nutrition will be maintained Outcome: Completed/Met   Problem: Coping: Goal: Level of anxiety will decrease Outcome: Completed/Met   Problem: Elimination: Goal: Will not experience complications related to bowel motility Outcome: Completed/Met Goal: Will not experience complications related to urinary retention Outcome: Completed/Met   Problem: Pain Managment: Goal: General experience of comfort will improve Outcome: Completed/Met   Problem: Safety: Goal: Ability to remain free from injury will improve Outcome: Completed/Met   Problem: Skin Integrity: Goal: Risk for impaired skin integrity will decrease Outcome: Completed/Met

## 2023-01-06 NOTE — Plan of Care (Signed)
  Problem: Skin Integrity: Goal: Risk for impaired skin integrity will decrease Outcome: Progressing   Problem: Pain Managment: Goal: General experience of comfort will improve Outcome: Progressing   Problem: Safety: Goal: Ability to remain free from injury will improve Outcome: Progressing   

## 2023-01-06 NOTE — Progress Notes (Signed)
Patient received discharge orders to go home with [facility]. Patient was given discharge paperwork/instructions. RN went over discharge paperwork/instructions with patient and patient's family member. All questions/concerns were addressed/answered. Patient left the hospital stable, had discharge paperwork/instructions, and had all personal belongings.

## 2023-01-06 NOTE — Discharge Summary (Signed)
Physician Discharge Summary   Patient: [name] MRN: [id number] DOB: 1934/07/15  Admit date:     01/03/2023  Discharge date: {dischdate:26783}  Discharge Physician: [name]   PCP: [name], MD   Recommendations at discharge:  {Tip this will not be part of the note when signed- Example include specific recommendations for outpatient follow-up, pending tests to follow-up on. (Optional):26781} Continue Eliquis, CBC check in 2 to 3 days, return if black stools develop.  Discharge Diagnoses: Principal Problem:   Upper GI bleed Active Problems:   Essential hypertension   Chronic diastolic CHF (congestive heart failure) (HCC)   COPD with chronic bronchitis   Type 2 diabetes mellitus with hyperlipidemia (HCC)   Symptomatic anemia   Dementia (HCC)   Breast cancer (HCC)   Class 3 obesity (HCC)   Hyperlipidemia   History of DVT (deep vein thrombosis)   Long term current use of anticoagulant therapy   CKD (chronic kidney disease), stage IV (HCC)   CAD (coronary artery disease)   Epigastric abdominal tenderness  Resolved Problems:   * No resolved hospital problems. [facility] Course: No notes on file  Assessment and Plan: No notes have been filed under this hospital service. Service: Hospitalist     {Tip this will not be part of the note when signed Body mass index is 42.01 kg/m. ,  Nutrition Documentation    Flowsheet Row ED to Hosp-Admission (Current) from 01/03/2023 in [facility] 4TH FLOOR PROGRESSIVE CARE AND UROLOGY  Nutrition Problem Increased nutrient needs  Etiology chronic illness, cancer and cancer related treatments  Nutrition Goal Patient will meet greater than or equal to 90% of their needs  Interventions Boost Breeze, MVI     ,  (Optional):26781}  {(NOTE) Pain control PDMP Statment (Optional):26782} Consultants: *** Procedures performed: ***  Disposition: {Plan; Disposition:26390} Diet recommendation:  Discharge Diet  Orders (From admission, onward)     Start     Ordered   01/06/23 0000  Diet Carb Modified        01/06/23 1432           {Diet_Plan:26776} DISCHARGE MEDICATION: Allergies as of 01/06/2023       Reactions   Sulfonamide Derivatives Swelling, Other (See Comments)   Mouth swelling- no resp issues noted   Duloxetine    Headaches   Lokelma [sodium Zirconium Cyclosilicate] Other (See Comments)   Headache, stomach upset   Aricept [donepezil Hcl] Diarrhea, Nausea And Vomiting, Other (See Comments)   GI upset/loose stools   Tramadol Nausea And Vomiting        Medication List     TAKE these medications    albuterol 108 (90 Base) MCG/ACT inhaler Commonly known as: ProAir HFA Inhale 1-2 puffs into the lungs every 6 (six) hours as needed for wheezing or shortness of breath.   apixaban 5 MG Tabs tablet Commonly known as: Eliquis Take 1 tablet (5 mg total) by mouth 2 (two) times daily.   atorvastatin 80 MG tablet Commonly known as: LIPITOR TAKE 1 TABLET BY MOUTH EVERY DAY What changed: when to take this   B & B Carpal Tunnel Brace Misc Use brace on wrist as needed for wrist pain   benzonatate 100 MG capsule Commonly known as: TESSALON Take 1 capsule (100 mg total) by mouth 2 (two) times daily as needed for cough.   carvedilol 12.5 MG tablet Commonly known as: COREG TAKE 1 TABLET BY MOUTH 2 TIMES DAILY What changed: when to take this  cetirizine 10 MG tablet Commonly known as: ZYRTEC Take 10 mg by mouth daily as needed for allergies or rhinitis.   diclofenac Sodium 1 % Gel Commonly known as: Voltaren Apply 2 g topically 4 (four) times daily. What changed:  when to take this reasons to take this   docusate sodium 100 MG capsule Commonly known as: COLACE Take 2 capsules (200 mg total) by mouth daily.   DULoxetine 30 MG capsule Commonly known as: CYMBALTA Take 30 mg by mouth daily.   febuxostat 40 MG tablet Commonly known as: ULORIC Take 1 tablet (40 mg  total) by mouth daily.   fluticasone 50 MCG/ACT nasal spray Commonly known as: FLONASE Place 2 sprays into both nostrils daily for 14 days. What changed: when to take this   folic acid 1 MG tablet Commonly known as: FOLVITE TAKE 1 TABLET BY MOUTH EVERY DAY   FreeStyle Libre 14 Day Sensor Misc PLACE 1 DEVICE ON THE SKIN AS DIRECTED EVERY 14 DAYS What changed: See the new instructions.   glucagon 1 MG injection Inject 1 mg into the vein once as needed for up to 1 dose.   glucose 4 GM chewable tablet Chew 1 tablet (4 g total) by mouth as needed for low blood sugar.   linaclotide 145 MCG Caps capsule Commonly known as: LINZESS Patient take 1 capsule daily.  May add an additional capsule for breakthrough constipation associated with opioid use What changed:  how much to take how to take this when to take this additional instructions   meclizine 25 MG tablet Commonly known as: ANTIVERT Take 1-2 tablets (25-50 mg total) by mouth 2 (two) times daily as needed for dizziness.   memantine 5 MG tablet Commonly known as: NAMENDA TAKE 2 TABLETS BY MOUTH EVERY MORNING and TAKE 1 TABLET BY MOUTH EVERY EVENING What changed:  how much to take how to take this when to take this additional instructions   montelukast 10 MG tablet Commonly known as: SINGULAIR Take 1 tablet (10 mg total) by mouth at bedtime.   nitroGLYCERIN 0.4 MG SL tablet Commonly known as: Nitrostat Take one tablet under the tongue every 5 minutes as needed for chest pain What changed:  how much to take how to take this when to take this reasons to take this additional instructions   NovoLOG FlexPen 100 UNIT/ML FlexPen Generic drug: insulin aspart Inject 14 Units into the skin 2 (two) times daily with a meal. Max daily dose 70 units   nystatin cream Commonly known as: MYCOSTATIN Apply 1 Application topically 2 (two) times daily.   ondansetron 4 MG tablet Commonly known as: Zofran Take 1 tablet (4 mg  total) by mouth every 8 (eight) hours as needed for nausea or vomiting.   oxyCODONE-acetaminophen 7.5-325 MG tablet Commonly known as: PERCOCET Take 1 tablet by mouth 2 (two) times daily as needed (for pain).   Ozempic (0.25 or 0.5 MG/DOSE) 2 MG/3ML Sopn Generic drug: Semaglutide(0.25 or 0.5MG /DOS) Inject 0.5 mg into the skin once a week. What changed: when to take this   pantoprazole 20 MG tablet Commonly known as: PROTONIX TAKE 1 TABLET BY MOUTH EVERY DAY What changed: when to take this   sodium polystyrene 15 GM/60ML suspension Commonly known as: SPS TAKE 60 MLS BY MOUTH ONCE WEEKLY TO KEEP POTASSIUM DOWN   Symbicort 160-4.5 MCG/ACT inhaler Generic drug: budesonide-formoterol Inhale 2 puffs by mouth twice daily What changed:  when to take this reasons to take this   torsemide 20  MG tablet Commonly known as: DEMADEX TAKE 1 TABLET BY MOUTH EVERY DAY TAKE AN EXTRA DOES FOR WEIGHT GAIN, 3LBs IN 1 DAY OR 5LBS IN 1 WEEK What changed: See the new instructions.   Toujeo Max SoloStar 300 UNIT/ML Solostar Pen Generic drug: insulin glargine (2 Unit Dial) Inject 40 Units into the skin daily. Patient assistance program provides. Patient reports 40 units 04/24/2022 What changed:  how much to take when to take this additional instructions   traZODone 50 MG tablet Commonly known as: DESYREL Take 50 mg by mouth at bedtime.   TYLENOL 500 MG tablet Generic drug: acetaminophen Take 1,000 mg by mouth every 6 (six) hours as needed for mild pain or headache.   Tylenol PM Extra Strength 500-25 MG Tabs tablet Generic drug: diphenhydramine-acetaminophen Take 2 tablets by mouth at bedtime.   Vitamin D (Ergocalciferol) 1.25 MG (50000 UNIT) Caps capsule Commonly known as: DRISDOL TAKE 1 CAPSULE BY MOUTH ONCE WEEKLY What changed: when to take this        Discharge Exam: Filed Weights   01/04/23 0116  Weight: 121.7 kg   ***  Condition at discharge: {DC Condition:26389}  The  results of significant diagnostics from this hospitalization (including imaging, microbiology, ancillary and laboratory) are listed below for reference.   Imaging Studies: CT ABDOMEN PELVIS WO CONTRAST  Result Date: 01/03/2023 CLINICAL DATA:  Epigastric pain. EXAM: CT ABDOMEN AND PELVIS WITHOUT CONTRAST TECHNIQUE: Multidetector CT imaging of the abdomen and pelvis was performed following the standard protocol without IV contrast. RADIATION DOSE REDUCTION: This exam was performed according to the departmental dose-optimization program which includes automated exposure control, adjustment of the mA and/or kV according to patient size and/or use of iterative reconstruction technique. COMPARISON:  CT abdomen pelvis dated 03/26/2022. FINDINGS: Evaluation of this exam is limited in the absence of intravenous contrast. Lower chest: The visualized lung bases are clear. There is advanced 3 vessel coronary vascular calcification. No intra-abdominal free air or free fluid. Hepatobiliary: The liver is unremarkable. No biliary dilatation. Multiple gallstones. No pericholecystic fluid or evidence of acute cholecystitis by CT. Pancreas: Unremarkable. No pancreatic ductal dilatation or surrounding inflammatory changes. Spleen: Normal in size without focal abnormality. Adrenals/Urinary Tract: The adrenal glands are unremarkable. There is no hydronephrosis or nephrolithiasis on either side. Bilateral renal cysts. There is no hydronephrosis or nephrolithiasis on either side. The visualized ureters and urinary bladder appear unremarkable. Stomach/Bowel: There is postsurgical changes of bowel with multiple anastomotic staple lines. There is no bowel obstruction or active inflammation. Appendectomy. Vascular/Lymphatic: Mild aortoiliac atherosclerotic disease. The IVC is unremarkable. No portal venous gas. There is no adenopathy. Reproductive: The uterus is anteverted and grossly unremarkable. No adnexal masses. Other: Anterior  abdominal wall hernia repair.  No recurrent hernia. Musculoskeletal: Osteopenia with degenerative changes. No acute osseous pathology. IMPRESSION: 1. No acute intra-abdominal or pelvic pathology. 2. Cholelithiasis. 3.  Aortic Atherosclerosis (ICD10-I70.0). Electronically Signed   By: Elgie Collard M.D.   On: 01/03/2023 21:32   CT Chest Wo Contrast  Result Date: 12/29/2022 CLINICAL DATA:  Chest pain, abnormal chest x-ray EXAM: CT CHEST WITHOUT CONTRAST TECHNIQUE: Multidetector CT imaging of the chest was performed following the standard protocol without IV contrast. RADIATION DOSE REDUCTION: This exam was performed according to the departmental dose-optimization program which includes automated exposure control, adjustment of the mA and/or kV according to patient size and/or use of iterative reconstruction technique. COMPARISON:  Chest x-ray dated December 29, 2022; CT chest, abdomen and pelvis dated February 08, 2022;  chest CT dated July 04, 2021 FINDINGS: Cardiovascular: Normal heart size. No pericardial effusion. Normal caliber thoracic aorta with moderate atherosclerotic disease. Severe coronary artery calcifications. Mediastinum/Nodes: Small hiatal hernia. Thyroid is unremarkable. No enlarged lymph nodes seen in the chest. Lungs/Pleura: Central airways are patent. No consolidation, pleural effusion or pneumothorax. No reticular opacities, traction bronchiectasis or honeycomb change. Small solid nodule of the left upper lobe measuring 4 mm on series 4, image 138, no follow-up imaging is necessary given greater than 1 year stability. Upper Abdomen: Gallstones. Low-attenuation right renal lesions which are likely simple cysts. Musculoskeletal: No chest wall mass or suspicious bone lesions identified. IMPRESSION: 1. No acute findings in the chest or evidence interstitial lung disease. 2. Coronary artery calcifications aortic Atherosclerosis (ICD10-I70.0). Electronically Signed   By: Allegra Lai M.D.    On: 12/29/2022 17:03   DG Chest Port 1 View  Result Date: 12/29/2022 CLINICAL DATA:  Chest pain. EXAM: PORTABLE CHEST 1 VIEW COMPARISON:  Chest x-ray dated July 12, 2022. FINDINGS: The heart size and mediastinal contours are within normal limits. Mildly increased ill-defined interstitial opacities, worst at the lung bases. No focal consolidation, pleural effusion, or pneumothorax. No acute osseous abnormality. IMPRESSION: 1. Increased ill-defined interstitial opacities, worst at the lung bases, concerning for atypical infection or mild interstitial pulmonary edema. Electronically Signed   By: Obie Dredge M.D.   On: 12/29/2022 13:44    Microbiology: Results for orders placed or performed during the hospital encounter of 12/29/22  SARS Coronavirus 2 by RT PCR (hospital order, performed in [facility] hospital lab) *cepheid single result test* Anterior Nasal Swab     Status: None   Collection Time: 12/29/22 12:37 PM   Specimen: Anterior Nasal Swab  Result Value Ref Range Status   SARS Coronavirus 2 by RT PCR NEGATIVE NEGATIVE Final    Comment: Performed at [facility], [address] [place], [place] [address]   *Note: Due to a large number of results and/or encounters for the requested time period, some results have not been displayed. A complete set of results can be found in Results Review.    Labs: CBC: Recent Labs  Lab 01/03/23 1415 01/03/23 1635 01/04/23 0425 01/04/23 1015 01/04/23 1416 01/04/23 1920 01/05/23 0100 01/06/23 0436  WBC 6.4 7.6   < > 8.3 8.7 7.1 7.3 9.7  NEUTROABS 4.2 4.9  --   --   --   --  4.4 5.8  HGB 7.1* 7.0*   < > 8.6* 9.0* 8.0* 7.7* 8.5*  HCT 23.1* 23.4*   < > 27.7* 29.6* 26.4* 25.5* 27.5*  MCV 91.3 92.9   < > 91.1 94.0 93.3 92.1 92.6  PLT 216 213   < > 195 194 182 188 181   < > = values in this interval not displayed.   Basic Metabolic Panel: Recent Labs  Lab 01/03/23 1415 01/03/23 1635 01/04/23 0425 01/05/23 0102 01/06/23 0436  NA 138  137 132* 136 138  K 4.3 4.6 4.0 4.7 4.7  CL 105 103 101 107 107  CO2 29 25 22 22 23   GLUCOSE 144* 142* 117* 83 79  BUN 77* 75* 73* 59* 55*  CREATININE 2.22* 2.32* 2.16* 2.00* 1.83*  CALCIUM 8.5* 8.7* 7.8* 8.0* 8.2*  MG  --  1.5* 1.7  --   --   PHOS  --  3.2 3.0  --   --    Liver Function Tests: Recent Labs  Lab 01/03/23 1415 01/03/23 1635 01/04/23 0425  AST 20 22 20   ALT 12 16 15   ALKPHOS 73 69 66  BILITOT 0.3 0.4 0.8  PROT 6.8 6.7 6.3*  ALBUMIN 3.0* 2.7* 2.4*   CBG: Recent Labs  Lab 01/06/23 0001 01/06/23 0544 01/06/23 0614 01/06/23 0752 01/06/23 1227  GLUCAP 195* 66* 103* 115* 226*    Discharge time spent: {LESS THAN/GREATER THAN:26388} 30 minutes.  Signed: [name], MD [facility] 01/06/2023

## 2023-01-06 NOTE — TOC Initial Note (Signed)
Transition of Care [facility]) - Initial/Assessment Note    Patient Details  Name: [name] MRN: [id number] Date of Birth: 1934/05/26  Transition [facility] CM/SW Contact:    Carmina Miller, LCSWA Phone Number: 01/06/2023, 2:47 PM  Clinical Narrative:                  CSW spoke with Bjorn Loser at [facility], she confirmed HH RN, PT, and OT services are being provided to pt, resumption of services placed by MD.  Expected Discharge Plan: Home w Home Health Services Barriers to Discharge: Barriers Resolved   Patient Goals and CMS Choice Patient states their goals for this hospitalization and ongoing recovery are:: get better soon          Expected Discharge Plan and Services       Living arrangements for the past 2 months: Single Family Home Expected Discharge Date: 01/06/23                         HH Arranged: OT, PT, RN HH Agency: [facility] Date [facility] Agency Contacted: 01/06/23 Time HH Agency Contacted: [phone] Representative spoke with at [facility] Agency: Rhonda-resumption of services  Prior Living Arrangements/Services Living arrangements for the past 2 months: Single Family Home                     Activities of Daily Living Home Assistive Devices/Equipment: Bedside commode/3-in-1, Walker (specify type) ADL Screening (condition at time of admission) Patient's cognitive ability adequate to safely complete daily activities?: Yes Is the patient deaf or have difficulty hearing?: Yes Does the patient have difficulty seeing, even when wearing glasses/contacts?: Yes Does the patient have difficulty concentrating, remembering, or making decisions?: Yes Patient able to express need for assistance with ADLs?: Yes Does the patient have difficulty dressing or bathing?: Yes Independently performs ADLs?: No Communication: Needs assistance Is this a change from baseline?: Pre-admission baseline Dressing (OT): Needs assistance Is this a change from baseline?:  Pre-admission baseline Grooming: Needs assistance Is this a change from baseline?: Pre-admission baseline Feeding: Independent Bathing: Needs assistance Is this a change from baseline?: Pre-admission baseline Toileting: Needs assistance Is this a change from baseline?: Pre-admission baseline In/Out Bed: Needs assistance Is this a change from baseline?: Pre-admission baseline Walks in Home: Needs assistance Is this a change from baseline?: Pre-admission baseline Does the patient have difficulty walking or climbing stairs?: Yes Weakness of Legs: Both Weakness of Arms/Hands: Both  Permission Sought/Granted                  Emotional Assessment              Admission diagnosis:  Upper GI bleed [K92.2] Patient Active Problem List   Diagnosis Date Noted   Upper GI bleed 01/03/2023   CAD (coronary artery disease) 01/03/2023   Epigastric abdominal tenderness 01/03/2023   Chills 12/19/2022   Chronic left hip pain 06/13/2022   Dependent for medication administration 06/13/2022   Very limited skin mobility 05/29/2022   Swelling of right upper extremity 04/27/2022   Carpal tunnel syndrome on both sides 04/27/2022   Diabetic peripheral neuropathy associated with type 2 diabetes mellitus (HCC) 04/05/2022   GAD (generalized anxiety disorder) 03/26/2022   Allergic rhinitis 03/26/2022   Type 2 diabetes mellitus with stage 4 chronic kidney disease, with long-term current use of insulin (HCC) 02/16/2022   Type 2 diabetes mellitus with diabetic neuropathy, with long-term current use of insulin (  HCC) 02/16/2022   Dermatosis papulosa nigra 02/09/2022   CKD (chronic kidney disease), stage IV (HCC) 02/09/2022   Pneumobilia 02/08/2022   Severe episode of recurrent major depressive disorder, without psychotic features (HCC) 12/05/2021   Breast cancer (HCC) 10/22/2021   Chronic pain of right hand 04/27/2021   Iron deficiency anemia 09/13/2020   Bilateral primary osteoarthritis of knee  04/12/2020   Chronic radicular lumbar pain 03/18/2020   Lumbar spondylosis 03/18/2020   Spinal stenosis, lumbar region, with neurogenic claudication 03/18/2020   Moderate nonproliferative diabetic retinopathy of both eyes without macular edema associated with type 2 diabetes mellitus (HCC) 08/27/2019   Type 2 diabetes mellitus with hyperlipidemia (HCC) 12/13/2017   GERD with esophagitis 11/15/2016   Dementia (HCC) 11/15/2016   Endometrial polyp 06/23/2016   Vitamin D deficiency 08/02/2015   Acute kidney injury superimposed on chronic kidney disease (HCC) 12/18/2014   Gout 10/27/2014   Vertigo 09/24/2014   Drug-induced constipation 05/10/2014   COPD with chronic bronchitis 04/21/2014   Estrogen deficiency 02/19/2014   Insomnia 09/17/2012   Long term current use of anticoagulant therapy 02/02/2012   Chronic diastolic CHF (congestive heart failure) (HCC) 01/29/2012   History of DVT (deep vein thrombosis) 01/27/2012   Class 3 obesity (HCC) 02/15/2009   Symptomatic anemia 02/15/2009   TENSION HEADACHE 01/07/2008   Hyperlipidemia 12/03/2006   Essential hypertension 12/03/2006   PCP:  [name], MD Pharmacy:   [facility] [address] [place] - [address] [place] [address] Phone: [phone] Fax: [phone]     Social Determinants of Health (SDOH) Social History: SDOH Screenings   Food Insecurity: No Food Insecurity (01/03/2023)  Housing: Low Risk  (01/03/2023)  Transportation Needs: No Transportation Needs (01/03/2023)  Utilities: Not At Risk (01/03/2023)  Alcohol Screen: Low Risk  (10/14/2021)  Depression (PHQ2-9): Low Risk  (12/21/2022)  Recent Concern: Depression (PHQ2-9) - High Risk (12/19/2022)  Financial Resource Strain: Low Risk  (06/05/2022)  Physical Activity: Insufficiently Active (06/05/2022)  Social Connections: Moderately Isolated (04/20/2022)  Stress: Stress Concern Present (06/05/2022)  Tobacco Use:  Low Risk  (01/05/2023)   SDOH Interventions:     Readmission Risk Interventions    11/24/2021   10:03 AM 10/20/2021    3:50 PM  Readmission Risk Prevention Plan  Transportation Screening Complete Complete  Medication Review Oceanographer) Complete Complete  PCP or Specialist appointment within 3-5 days of discharge Complete Complete  HRI or Home Care Consult Complete Complete  SW Recovery Care/Counseling Consult Complete Complete  Palliative Care Screening Not Applicable Not Applicable  Skilled Nursing Facility Not Applicable Not Applicable

## 2023-01-08 ENCOUNTER — Encounter (HOSPITAL_COMMUNITY): Payer: Self-pay | Admitting: Internal Medicine

## 2023-01-08 LAB — BPAM RBC
Blood Product Expiration Date: 202409102359
Blood Product Expiration Date: 202409222359
ISSUE DATE / TIME: 202408281929
ISSUE DATE / TIME: 202408301643
Unit Type and Rh: 6200
Unit Type and Rh: 6200

## 2023-01-08 LAB — TYPE AND SCREEN
ABO/RH(D): A POS
Antibody Screen: NEGATIVE
Unit division: 0
Unit division: 0

## 2023-01-08 NOTE — Anesthesia Postprocedure Evaluation (Signed)
Anesthesia Post Note  Patient: [name]  Procedure(s) Performed: ESOPHAGOGASTRODUODENOSCOPY (EGD) WITH PROPOFOL     Patient location during evaluation: PACU Anesthesia Type: MAC Level of consciousness: awake and alert Pain management: pain level controlled Vital Signs Assessment: post-procedure vital signs reviewed and stable Respiratory status: spontaneous breathing, nonlabored ventilation and respiratory function stable Cardiovascular status: blood pressure returned to baseline Postop Assessment: no apparent nausea or vomiting Anesthetic complications: no   No notable events documented.          Shanda Howells

## 2023-01-09 ENCOUNTER — Telehealth: Payer: Self-pay

## 2023-01-09 NOTE — Transitions of Care (Post Inpatient/ED Visit) (Signed)
01/09/2023  Name: [name] MRN: [id number] DOB: May 23, 1934  Today's TOC FU Call Status: Today's TOC FU Call Status:: Successful TOC FU Call Completed TOC FU Call Complete Date: 01/09/23 Patient's Name and Date of Birth confirmed.  Transition Care Management Follow-up Telephone Call Date of Discharge: 01/06/23 Discharge Facility: [facility]) Type of Discharge: Inpatient Admission Primary Inpatient Discharge Diagnosis:: Upper GI Bleed How have you been since you were released from the hospital?: Better (Per patient's daughter, she is feeling better) Any questions or concerns?: No  Items Reviewed: Did you receive and understand the discharge instructions provided?: Yes Medications obtained,verified, and reconciled?: Yes (Medications Reviewed) Any new allergies since your discharge?: No Dietary orders reviewed?: Yes Type of Diet Ordered:: Carbohydrate Modiified Do you have support at home?: Yes People in Home: child(ren), adult Name of Support/Comfort Primary Source: [name]  Medications Reviewed Today: Medications Reviewed Today     Reviewed by Jodelle Gross, RN (Case Manager) on 01/09/23 at 1529  Med List Status: <None>   Medication Order Taking? Sig Documenting Provider Last Dose Status Informant  albuterol (PROAIR HFA) 108 (90 Base) MCG/ACT inhaler [id number] Yes Inhale 1-2 puffs into the lungs every 6 (six) hours as needed for wheezing or shortness of breath. Sharon Seller, NP Taking Active Family Member  apixaban (ELIQUIS) 5 MG TABS tablet [id number] Yes Take 1 tablet (5 mg total) by mouth 2 (two) times daily. [name], MD Taking Active Family Member  atorvastatin (LIPITOR) 80 MG tablet [id number] Yes TAKE 1 TABLET BY MOUTH EVERY DAY  Patient taking differently: Take 80 mg by mouth at bedtime.   [name], MD Taking Active Family Member  benzonatate [facility]) 100 MG capsule [id number] No Take 1 capsule (100 mg total) by mouth 2 (two) times  daily as needed for cough. [name], MD Unknown Active Family Member  carvedilol (COREG) 12.5 MG tablet [id number] Yes TAKE 1 TABLET BY MOUTH 2 TIMES DAILY  Patient taking differently: Take 12.5 mg by mouth 2 (two) times daily with a meal.   [name], MD Taking Active Family Member  cetirizine (ZYRTEC) 10 MG tablet [id number] Yes Take 10 mg by mouth daily as needed for allergies or rhinitis. [provider] Taking Active Family Member  Continuous Glucose Sensor (FREESTYLE LIBRE 14 DAY SENSOR) [place] [id number] Yes PLACE 1 DEVICE ON THE SKIN AS DIRECTED EVERY 14 DAYS  Patient taking differently: Inject 1 Device into the skin every 14 (fourteen) days.   Shamleffer, Konrad Dolores, MD Taking Active Family Member  diclofenac Sodium (VOLTAREN) 1 % GEL [id number] Yes Apply 2 g topically 4 (four) times daily.  Patient taking differently: Apply 2 g topically 4 (four) times daily as needed (pain).   [name], MD Taking Active Family Member  docusate sodium (COLACE) 100 MG capsule [id number] No Take 2 capsules (200 mg total) by mouth daily.  Patient not taking: Reported on 01/03/2023   [name], MD Not Taking Active Family Member  DULoxetine (CYMBALTA) 30 MG capsule [id number] Yes Take 30 mg by mouth daily. [provider] Taking Active Family Member  Elastic Bandages & Supports (B & B CARPAL TUNNEL BRACE) MISC [id number] No Use brace on wrist as needed for wrist pain [name], MD Unknown Active Family Member  febuxostat (ULORIC) 40 MG tablet [id number] Yes Take 1 tablet (40 mg total) by mouth daily. [name], MD Taking Active Family Member  fluticasone [facility]) 50 MCG/ACT nasal spray  [id number]  Place 2 sprays into both nostrils daily for 14 days.  Patient taking differently: Place 2 sprays into both nostrils every other day.   Salvatore Decent, FNP  Expired 01/03/23 2359 Family Member  folic acid (FOLVITE) 1 MG tablet [id number] Yes TAKE 1 TABLET  BY MOUTH EVERY DAY  Patient taking differently: Take 1 mg by mouth daily.   [name], MD Taking Active Family Member  glucagon 1 MG injection [id number] Yes Inject 1 mg into the vein once as needed for up to 1 dose. [name], MD Taking Active Family Member  glucose 4 GM chewable tablet [id number] Yes Chew 1 tablet (4 g total) by mouth as needed for low blood sugar. [name], MD Taking Active Family Member  insulin glargine, 2 Unit Dial, (TOUJEO MAX SOLOSTAR) 300 UNIT/ML Solostar Pen [id number] Yes Inject 40 Units into the skin daily. Patient assistance program provides. Patient reports 40 units 04/24/2022  Patient taking differently: Inject 34 Units into the skin daily before breakfast.   Shamleffer, Konrad Dolores, MD Taking Active Family Member  linaclotide [facility]) 145 MCG CAPS capsule [id number] Yes Patient take 1 capsule daily.  May add an additional capsule for breakthrough constipation associated with opioid use  Patient taking differently: Take 145 mcg by mouth every other day.   [name], MD Taking Active Family Member  meclizine (ANTIVERT) 25 MG tablet [id number] Yes Take 1-2 tablets (25-50 mg total) by mouth 2 (two) times daily as needed for dizziness. [name], MD Taking Active Family Member  memantine [facility]) 5 MG tablet [id number] Yes TAKE 2 TABLETS BY MOUTH EVERY MORNING and TAKE 1 TABLET BY MOUTH EVERY EVENING  Patient taking differently: Take 5-10 mg by mouth See admin instructions. Take 10 mg by mouth in the morning and 5 mg in the evening   [name], MD Taking Active Family Member  montelukast (SINGULAIR) 10 MG tablet [id number] No Take 1 tablet (10 mg total) by mouth at bedtime.  Patient not taking: Reported on 01/03/2023   [name], MD Not Taking Active Family Member  nitroGLYCERIN (NITROSTAT) 0.4 MG SL tablet [id number]  Take one tablet under the tongue every 5 minutes as needed for chest pain  Patient taking  differently: Place 0.4 mg under the tongue every 5 (five) minutes as needed for chest pain.   Sharon Seller, NP  Active Family Member           Med Note Maurice Small   Fri Oct 11, 2018  2:17 PM)    NOVOLOG FLEXPEN 100 UNIT/ML FlexPen [id number] Yes Inject 14 Units into the skin 2 (two) times daily with a meal. Max daily dose 70 units Shamleffer, Konrad Dolores, MD Taking Active Family Member  nystatin cream (MYCOSTATIN) [id number] No Apply 1 Application topically 2 (two) times daily.  Patient not taking: Reported on 01/03/2023   Kermit Balo, DO Not Taking Active Family Member  ondansetron [facility]) 4 MG tablet [id number] Yes Take 1 tablet (4 mg total) by mouth every 8 (eight) hours as needed for nausea or vomiting. [name], MD Taking Active Family Member  oxyCODONE-acetaminophen [facility]) 7.5-325 MG tablet [id number] Yes Take 1 tablet by mouth 2 (two) times daily as needed (for pain). [provider] Taking Active Family Member  pantoprazole (PROTONIX) 20 MG tablet [id number] Yes TAKE 1 TABLET BY MOUTH EVERY DAY  Patient taking differently: Take 20 mg by mouth daily before breakfast.   Fanny Bien  B, MD Taking Active Family Member  Semaglutide,0.25 or 0.5MG /DOS, (OZEMPIC, 0.25 OR 0.5 MG/DOSE,) 2 MG/3ML SOPN [id number] Yes Inject 0.5 mg into the skin once a week.  Patient taking differently: Inject 0.5 mg into the skin every Monday.   Shamleffer, Konrad Dolores, MD Taking Active Family Member           Med Note Antony Madura, Melody Comas Jan 03, 2023  9:44 PM)    sodium polystyrene (SPS) 15 GM/60ML suspension [id number] No TAKE 60 MLS BY MOUTH ONCE WEEKLY TO KEEP POTASSIUM DOWN  Patient not taking: Reported on 01/03/2023   [name], MD Not Taking Active Family Member  [facility] 160-4.5 MCG/ACT inhaler [id number] Yes Inhale 2 puffs by mouth twice daily  Patient taking differently: Inhale 2 puffs into the lungs 2 (two) times daily as needed (for flares).    Sharon Seller, NP Taking Active Family Member  torsemide (DEMADEX) 20 MG tablet [id number] Yes TAKE 1 TABLET BY MOUTH EVERY DAY TAKE AN EXTRA DOES FOR WEIGHT GAIN, 3LBs IN 1 DAY OR 5LBS IN 1 WEEK  Patient taking differently: Take 20 mg by mouth. Take 20 mg by mouth in the morning and an additional 20 mg for a weight gain of 3 pounds in one day or 5 pounds in one week   [name], MD Taking Active Family Member  traZODone (DESYREL) 50 MG tablet [id number] Yes Take 50 mg by mouth at bedtime. [provider] Taking Active Family Member  TYLENOL 500 MG tablet [id number] Yes Take 1,000 mg by mouth every 6 (six) hours as needed for mild pain or headache. [provider] Taking Active Family Member  TYLENOL PM EXTRA STRENGTH 500-25 MG TABS tablet [id number] Yes Take 2 tablets by mouth at bedtime. [provider] Taking Active Family Member  Vitamin D, Ergocalciferol, (DRISDOL) 1.25 MG (50000 UNIT) CAPS capsule [id number] Yes TAKE 1 CAPSULE BY MOUTH ONCE WEEKLY  Patient taking differently: Take 50,000 Units by mouth every Monday.   [name], MD Taking Active Family Member  Med List Note Vernie Ammons, RN 06/15/22 1332): MR 10/01/22.            Home Care and Equipment/Supplies: Were Home Health Services Ordered?: No Any new equipment or medical supplies ordered?: No  Functional Questionnaire: Do you need assistance with bathing/showering or dressing?: Yes Do you need assistance with meal preparation?: Yes Do you need assistance with eating?: No Do you have difficulty maintaining continence: No Do you need assistance with getting out of bed/getting out of a chair/moving?: No Do you have difficulty managing or taking your medications?: No  Follow up appointments reviewed: PCP Follow-up appointment confirmed?: Yes Date of PCP follow-up appointment?: 01/10/23 Follow-up Provider: Dr. Janee Morn [facility] Follow-up appointment  confirmed?: Yes Date of Specialist follow-up appointment?: 01/18/23 Follow-Up Specialty Provider:: Dr. Candise Che Do you need transportation to your follow-up appointment?: No Do you understand care options if your condition(s) worsen?: Yes-patient verbalized understanding  SDOH Interventions Today    Flowsheet Row Most Recent Value  SDOH Interventions   Food Insecurity Interventions Intervention Not Indicated  Housing Interventions Intervention Not Indicated     Jodelle Gross RN, BSN, CCM [facility] RN Care Coordinator/ Transitions of Care Direct Dial: [phone]  Fax: [phone]

## 2023-01-10 ENCOUNTER — Encounter: Payer: Self-pay | Admitting: Family Medicine

## 2023-01-10 ENCOUNTER — Other Ambulatory Visit: Payer: PPO

## 2023-01-10 ENCOUNTER — Ambulatory Visit: Payer: PPO | Admitting: Family Medicine

## 2023-01-10 ENCOUNTER — Ambulatory Visit: Payer: PPO | Admitting: Hematology

## 2023-01-10 VITALS — BP 130/74 | HR 70 | Temp 97.8°F

## 2023-01-10 DIAGNOSIS — H66004 Acute suppurative otitis media without spontaneous rupture of ear drum, recurrent, right ear: Secondary | ICD-10-CM | POA: Diagnosis not present

## 2023-01-10 DIAGNOSIS — N184 Chronic kidney disease, stage 4 (severe): Secondary | ICD-10-CM | POA: Diagnosis not present

## 2023-01-10 DIAGNOSIS — J441 Chronic obstructive pulmonary disease with (acute) exacerbation: Secondary | ICD-10-CM | POA: Diagnosis not present

## 2023-01-10 DIAGNOSIS — D62 Acute posthemorrhagic anemia: Secondary | ICD-10-CM | POA: Diagnosis not present

## 2023-01-10 DIAGNOSIS — J322 Chronic ethmoidal sinusitis: Secondary | ICD-10-CM | POA: Diagnosis not present

## 2023-01-10 DIAGNOSIS — Z794 Long term (current) use of insulin: Secondary | ICD-10-CM

## 2023-01-10 DIAGNOSIS — E1122 Type 2 diabetes mellitus with diabetic chronic kidney disease: Secondary | ICD-10-CM | POA: Diagnosis not present

## 2023-01-10 DIAGNOSIS — J069 Acute upper respiratory infection, unspecified: Secondary | ICD-10-CM | POA: Diagnosis not present

## 2023-01-10 MED ORDER — AMOXICILLIN-POT CLAVULANATE 875-125 MG PO TABS
1.0000 | ORAL_TABLET | Freq: Two times a day (BID) | ORAL | 0 refills | Status: DC
Start: 2023-01-10 — End: 2023-04-09

## 2023-01-10 MED ORDER — IPRATROPIUM-ALBUTEROL 0.5-2.5 (3) MG/3ML IN SOLN
3.0000 mL | Freq: Four times a day (QID) | RESPIRATORY_TRACT | 0 refills | Status: DC | PRN
Start: 2023-01-10 — End: 2023-10-09

## 2023-01-10 MED ORDER — TRELEGY ELLIPTA 100-62.5-25 MCG/ACT IN AEPB
1.0000 | INHALATION_SPRAY | Freq: Every day | RESPIRATORY_TRACT | 11 refills | Status: AC
Start: 2023-01-10 — End: 2024-01-05

## 2023-01-10 MED ORDER — IPRATROPIUM-ALBUTEROL 0.5-2.5 (3) MG/3ML IN SOLN
3.0000 mL | Freq: Once | RESPIRATORY_TRACT | Status: AC
Start: 2023-01-10 — End: 2023-01-10
  Administered 2023-01-10: 3 mL via RESPIRATORY_TRACT

## 2023-01-10 NOTE — Patient Instructions (Signed)
Medications:  Augmentin: Take 1 pill (835mg /125mg ) twice daily for otitis media in the right ear. Trelegy: Use 1 puff daily for COPD. Protonix: Continue as prescribed for GI protection. Symbicort: Discontinue and switch to Trelegy. Apixaban: Continue with 5mg  as prescribed for recurrent VTE.  Nebulizer Treatment:  DuoNebs: Use every 6 hours as needed for coughing and shortness of breath. Nebulizer: We are working to help you acquire a device for home use through a durable medical equipment company.  Monitoring and Symptoms Management:  Blood Counts and Kidney Function: Recheck CBC and BMET to monitor anemia and CKD baseline levels. COPD Flare: Monitor for any increase in shortness of breath or wheezing. Ear Infection: Track any continued pain or symptoms in the right ear. Fatigue and Anemia: Keep an eye on energy levels and any episodes of dark stools that may indicate further GI bleeding.  If symptoms do not improve, please follow-up.  Please go to the emergency room if you develop severe symptoms of chest pain, shortness of breath or any other worrisome symptom.

## 2023-01-10 NOTE — Progress Notes (Unsigned)
Assessment/Plan:   Problem List Items Addressed This Visit       Respiratory   COPD with acute exacerbation (HCC) - Primary    Administer DuoNeb neb treatment in office, patient reports improved breathing after this. Review need for at-home nebulizer Change Symbicort to Trelegy 1 puff daily Monitor symptoms, follow up for persistent issues.      Relevant Medications   amoxicillin-clavulanate (AUGMENTIN) 875-125 MG tablet   ipratropium-albuterol (DUONEB) 0.5-2.5 (3) MG/3ML SOLN   Fluticasone-Umeclidin-Vilant (TRELEGY ELLIPTA) 100-62.5-25 MCG/ACT AEPB   Other Relevant Orders   For home use only DME Nebulizer machine   Chronic ethmoidal sinusitis   Relevant Medications   amoxicillin-clavulanate (AUGMENTIN) 875-125 MG tablet   Viral URI with cough    Symptomatic treatment with fluids and rest Continue Singulair and Flonase as prescribed        Endocrine   Type 2 diabetes mellitus with stage 4 chronic kidney disease, with long-term current use of insulin (HCC)   Relevant Orders   Basic Metabolic Panel (BMET) (Completed)     Nervous and Auditory   Recurrent acute suppurative otitis media of right ear without spontaneous rupture of tympanic membrane    Prescribe Augmentin 835/125 mg BID for 10 days Monitor for symptom improvement, follow up if no improvement.      Relevant Medications   amoxicillin-clavulanate (AUGMENTIN) 875-125 MG tablet     Other   ABLA (acute blood loss anemia)    Check CBC to assess current hemoglobin levels Educate patient on monitoring for symptoms like dark stools and increased fatigue Consider further GI evaluation if anemia persists.      Relevant Orders   CBC w/Diff (Completed)    Medications Discontinued During This Encounter  Medication Reason   SYMBICORT 160-4.5 MCG/ACT inhaler     Return in about 2 weeks (around 01/24/2023), or if symptoms worsen or fail to improve.    Subjective:   Encounter date: 01/10/2023  [name] is a 89 y.o. female who has Hyperlipidemia; Class 3 obesity (HCC); Symptomatic anemia; TENSION HEADACHE; Essential hypertension; History of DVT (deep vein thrombosis); Chronic diastolic CHF (congestive heart failure) (HCC); Long term current use of anticoagulant therapy; Insomnia; Estrogen deficiency; COPD with chronic bronchitis; Drug-induced constipation; Vertigo; Gout; Acute kidney injury superimposed on chronic kidney disease (HCC); Vitamin D deficiency; Endometrial polyp; GERD with esophagitis; Dementia (HCC); Type 2 diabetes mellitus with hyperlipidemia (HCC); Moderate nonproliferative diabetic retinopathy of both eyes without macular edema associated with type 2 diabetes mellitus (HCC); Chronic radicular lumbar pain; Lumbar spondylosis; Spinal stenosis, lumbar region, with neurogenic claudication; Bilateral primary osteoarthritis of knee; Iron deficiency anemia; Chronic pain of right hand; Breast cancer (HCC); Severe episode of recurrent major depressive disorder, without psychotic features (HCC); Pneumobilia; Dermatosis papulosa nigra; CKD (chronic kidney disease), stage IV (HCC); Type 2 diabetes mellitus with stage 4 chronic kidney disease, with long-term current use of insulin (HCC); Type 2 diabetes mellitus with diabetic neuropathy, with long-term current use of insulin (HCC); COPD with acute exacerbation (HCC); GAD (generalized anxiety disorder); Allergic rhinitis; Diabetic peripheral neuropathy associated with type 2 diabetes mellitus (HCC); Swelling of right upper extremity; Carpal tunnel syndrome on both sides; Very limited skin mobility; Chronic left hip pain; Dependent for medication administration; Chills; Upper GI bleed; CAD (coronary artery disease); Epigastric abdominal tenderness; Chronic ethmoidal sinusitis; ABLA (acute blood loss anemia); Recurrent acute suppurative otitis media of right ear without spontaneous rupture of tympanic membrane; and Viral URI with cough on their problem  list..  She  has a past medical history of Abdominal pain (01/26/2012), Acute deep vein thrombosis (DVT) of femoral vein of right lower extremity (HCC) (03/26/2022), Acute kidney injury superimposed on chronic kidney disease (HCC) (12/18/2014), Acute upper GI bleed (03/26/2022), Allergy, Anemia, unspecified, Anxiety, Anxiety associated with depression (06/08/2017), Arthritis, Breast cancer (HCC), Breast cancer screening (02/19/2014), Breast mass (10/27/2014), Chronic diastolic CHF (congestive heart failure) (HCC), Chronic pain syndrome (02/06/2015), CKD (chronic kidney disease), stage III (HCC), Coronary artery disease, Debility (08/01/2012), Dementia (HCC), Depression, Diabetes mellitus, DOE (dyspnea on exertion) (06/24/2014), Dysphagia, unspecified(787.20) (07/31/2012), Fungal dermatitis (11/13/2007), GERD (gastroesophageal reflux disease), Gout, unspecified, Headache, History of anemia due to chronic kidney disease (03/26/2022), History of blood clots, History of deep venous thrombosis (01/27/2012), History of pulmonary embolus (PE) (04/22/2014), Hyperkalemia (07/26/2017), Hyperlipidemia, Hypertension, Insomnia, Joint pain, Joint swelling, Morbid obesity (HCC), Multiple benign nevi (11/15/2016), Myocardial infarction (HCC) (2004), Non-STEMI (non-ST elevated myocardial infarction) (HCC) (02/15/2020), Numbness, Obstructive sleep apnea, Osteoarthritis, Osteoarthritis, Osteoarthrosis, unspecified whether generalized or localized, lower leg, Pain in the chest (12/31/2013), Pain, chronic, Personal history of other diseases of the digestive system (12/03/2006), Polymyalgia rheumatica (HCC), Presence of stent in right coronary artery, Pulmonary emboli (HCC) (01/2012), Situational depression (06/04/2009), Syncope, vasovagal (07/04/2021), Type 2 diabetes mellitus with hyperlipidemia (HCC) (02/16/2022), Urinary incontinence, Vaginitis and vulvovaginitis (06/23/2016), Vertigo, and Wears dentures..   She presents with  chief complaint of Hospitalization Follow-up (Repeat CBC. Would like to discuss flu shot. Still not feeling well from prior visit. ) .   Hospital follow-up: Admission and Discharge Dates: Patient was admitted to [facility] on 28th August and discharged on 4th September.  Patient contacted by phone on 01/09/2023 Diagnosis and Treatment: The patient was treated for ongoing GI bleeding and associated anemia. Treatment for this included:  Blood transfusion Colonoscopy and EGD Imaging of the stomach and chest Ongoing management of anemia and respiratory symptoms Addition of PPIs Continued on anticoagulation Compliance and Current Condition: The patient reports good compliance with treatment.   Post-GI Bleeding Recovery: Patient was recently hospitalized on January 02, 2022, for ongoing GI bleeding and received a blood transfusion. Since discharge, the patient reports feeling less weak but continues to experience tiredness.  Cold Symptoms: Patient presents with recent onset of cold symptoms, including chills, persistent cough, right ear pain, and mucous drainage from the nose to the back of the throat. These symptoms began during the hospitalization. Patient was tested for COVID-19 during the hospitalization, which returned negative. The patient also experienced weight loss.   COPD: Patient mentions a history of COPD and uses Symbicort and an albuterol inhaler. The patient finds minimal relief from inhalers and mentions a need for a nebulizer, which has not been set up at home yet.  Ear and Sinus Issues: Patient has ongoing right ear pain and occasional vertigo, attributing these to chronic ear infections and sinusitis, previously treated with doxycycline. Fluid was noted in the patient's ear upon examination.  Review of Systems  Constitutional:  Positive for chills, malaise/fatigue and weight loss.  HENT:  Positive for congestion, ear discharge and sinus pain.   Respiratory:   Positive for cough and shortness of breath. Negative for wheezing.   Cardiovascular:  Negative for chest pain and leg swelling.  Gastrointestinal:  Negative for blood in stool and melena.  Endo/Heme/Allergies:  Bruises/bleeds easily.    Past Surgical History:  Procedure Laterality Date   ABDOMINAL HYSTERECTOMY     partial   APPENDECTOMY     blood clots/legs and lungs  2013  BREAST BIOPSY Left 07/22/2014   BREAST BIOPSY Left 02/10/2013   BREAST LUMPECTOMY Left 11/05/2014   BREAST LUMPECTOMY WITH RADIOACTIVE SEED LOCALIZATION Left 11/05/2014   Procedure: LEFT BREAST LUMPECTOMY WITH RADIOACTIVE SEED LOCALIZATION;  Surgeon: Abigail Miyamoto, MD;  Location: [facility] OR;  Service: General;  Laterality: Left;   CARDIAC CATHETERIZATION     COLONOSCOPY     CORONARY ANGIOPLASTY  2   CORONARY STENT INTERVENTION N/A 02/17/2020   Procedure: CORONARY STENT INTERVENTION;  Surgeon: Marykay Lex, MD;  Location: [facility] INVASIVE CV LAB;  Service: Cardiovascular;  Laterality: N/A;   ESOPHAGOGASTRODUODENOSCOPY (EGD) WITH PROPOFOL N/A 11/07/2016   Procedure: ESOPHAGOGASTRODUODENOSCOPY (EGD) WITH PROPOFOL;  Surgeon: Iva Boop, MD;  Location: [facility] ENDOSCOPY;  Service: Endoscopy;  Laterality: N/A;   ESOPHAGOGASTRODUODENOSCOPY (EGD) WITH PROPOFOL N/A 01/05/2023   Procedure: ESOPHAGOGASTRODUODENOSCOPY (EGD) WITH PROPOFOL;  Surgeon: Hilarie Fredrickson, MD;  Location: [facility] ENDOSCOPY;  Service: Gastroenterology;  Laterality: N/A;   EXCISION OF SKIN TAG Right 11/05/2014   Procedure: EXCISION OF RIGHT EYELID SKIN TAG;  Surgeon: Abigail Miyamoto, MD;  Location: [facility] OR;  Service: General;  Laterality: Right;   EYE SURGERY Bilateral    cataract    GASTRIC BYPASS  1977    reversed in 1979, [facility]   LEFT HEART CATH AND CORONARY ANGIOGRAPHY N/A 02/17/2020   Procedure: LEFT HEART CATH AND CORONARY ANGIOGRAPHY;  Surgeon: Marykay Lex, MD;  Location: [facility] INVASIVE CV LAB;  Service: Cardiovascular;  Laterality: N/A;   LEFT HEART  CATHETERIZATION WITH CORONARY ANGIOGRAM N/A 06/29/2014   Procedure: LEFT HEART CATHETERIZATION WITH CORONARY ANGIOGRAM;  Surgeon: Lennette Bihari, MD;  Location: [facility] CATH LAB;  Service: Cardiovascular;  Laterality: N/A;   MEMBRANE PEEL Right 10/23/2018   Procedure: MEMBRANE PEEL;  Surgeon: Edmon Crape, MD;  Location: [facility] OR;  Service: Ophthalmology;  Laterality: Right;   MI with stent placement  2004   PARS PLANA VITRECTOMY Right 10/23/2018   Procedure: PARS PLANA VITRECTOMY WITH 25 GAUGE;  Surgeon: Edmon Crape, MD;  Location: [facility] OR;  Service: Ophthalmology;  Laterality: Right;    Outpatient Medications Prior to Visit  Medication Sig Dispense Refill   albuterol (PROAIR HFA) 108 (90 Base) MCG/ACT inhaler Inhale 1-2 puffs into the lungs every 6 (six) hours as needed for wheezing or shortness of breath. 6.7 g 1   apixaban (ELIQUIS) 5 MG TABS tablet Take 1 tablet (5 mg total) by mouth 2 (two) times daily. 180 tablet 3   atorvastatin (LIPITOR) 80 MG tablet TAKE 1 TABLET BY MOUTH EVERY DAY (Patient taking differently: Take 80 mg by mouth at bedtime.) 90 tablet 3   benzonatate (TESSALON) 100 MG capsule Take 1 capsule (100 mg total) by mouth 2 (two) times daily as needed for cough. 20 capsule 0   carvedilol (COREG) 12.5 MG tablet TAKE 1 TABLET BY MOUTH 2 TIMES DAILY (Patient taking differently: Take 12.5 mg by mouth 2 (two) times daily with a meal.) 180 tablet 1   cetirizine (ZYRTEC) 10 MG tablet Take 10 mg by mouth daily as needed for allergies or rhinitis.     Continuous Glucose Sensor (FREESTYLE LIBRE 14 DAY SENSOR) MISC PLACE 1 DEVICE ON THE SKIN AS DIRECTED EVERY 14 DAYS (Patient taking differently: Inject 1 Device into the skin every 14 (fourteen) days.) 6 each 2   diclofenac Sodium (VOLTAREN) 1 % GEL Apply 2 g topically 4 (four) times daily. (Patient taking differently: Apply 2 g topically 4 (four) times daily as needed (pain).)  150 g 2   docusate sodium (COLACE) 100 MG capsule Take 2 capsules (200  mg total) by mouth daily. 180 capsule 3   DULoxetine (CYMBALTA) 30 MG capsule Take 30 mg by mouth daily.     Elastic Bandages & Supports (B & B CARPAL TUNNEL BRACE) MISC Use brace on wrist as needed for wrist pain 2 each 10   febuxostat (ULORIC) 40 MG tablet Take 1 tablet (40 mg total) by mouth daily. 90 tablet 3   folic acid (FOLVITE) 1 MG tablet TAKE 1 TABLET BY MOUTH EVERY DAY (Patient taking differently: Take 1 mg by mouth daily.) 90 tablet 1   glucagon 1 MG injection Inject 1 mg into the vein once as needed for up to 1 dose. 1 each 12   glucose 4 GM chewable tablet Chew 1 tablet (4 g total) by mouth as needed for low blood sugar. 50 tablet 12   insulin glargine, 2 Unit Dial, (TOUJEO MAX SOLOSTAR) 300 UNIT/ML Solostar Pen Inject 40 Units into the skin daily. Patient assistance program provides. Patient reports 40 units 04/24/2022 (Patient taking differently: Inject 34 Units into the skin daily before breakfast.) 12 mL 1   linaclotide (LINZESS) 145 MCG CAPS capsule Patient take 1 capsule daily.  May add an additional capsule for breakthrough constipation associated with opioid use (Patient taking differently: Take 145 mcg by mouth every other day.) 180 capsule 1   meclizine (ANTIVERT) 25 MG tablet Take 1-2 tablets (25-50 mg total) by mouth 2 (two) times daily as needed for dizziness. 90 tablet 0   memantine (NAMENDA) 5 MG tablet TAKE 2 TABLETS BY MOUTH EVERY MORNING and TAKE 1 TABLET BY MOUTH EVERY EVENING (Patient taking differently: Take 5-10 mg by mouth See admin instructions. Take 10 mg by mouth in the morning and 5 mg in the evening) 270 tablet 5   montelukast (SINGULAIR) 10 MG tablet Take 1 tablet (10 mg total) by mouth at bedtime. 30 tablet 3   nitroGLYCERIN (NITROSTAT) 0.4 MG SL tablet Take one tablet under the tongue every 5 minutes as needed for chest pain (Patient taking differently: Place 0.4 mg under the tongue every 5 (five) minutes as needed for chest pain.) 50 tablet 0   NOVOLOG  FLEXPEN 100 UNIT/ML FlexPen Inject 14 Units into the skin 2 (two) times daily with a meal. Max daily dose 70 units 60 mL 0   nystatin cream (MYCOSTATIN) Apply 1 Application topically 2 (two) times daily. 30 g 3   ondansetron (ZOFRAN) 4 MG tablet Take 1 tablet (4 mg total) by mouth every 8 (eight) hours as needed for nausea or vomiting. 20 tablet 0   oxyCODONE-acetaminophen (PERCOCET) 7.5-325 MG tablet Take 1 tablet by mouth 2 (two) times daily as needed (for pain).     pantoprazole (PROTONIX) 20 MG tablet TAKE 1 TABLET BY MOUTH EVERY DAY (Patient taking differently: Take 20 mg by mouth daily before breakfast.) 30 tablet 5   Semaglutide,0.25 or 0.5MG /DOS, (OZEMPIC, 0.25 OR 0.5 MG/DOSE,) 2 MG/3ML SOPN Inject 0.5 mg into the skin once a week. (Patient taking differently: Inject 0.5 mg into the skin every Monday.) 3 mL 11   sodium polystyrene (SPS) 15 GM/60ML suspension TAKE 60 MLS BY MOUTH ONCE WEEKLY TO KEEP POTASSIUM DOWN 240 mL 0   torsemide (DEMADEX) 20 MG tablet TAKE 1 TABLET BY MOUTH EVERY DAY TAKE AN EXTRA DOES FOR WEIGHT GAIN, 3LBs IN 1 DAY OR 5LBS IN 1 WEEK (Patient taking differently: Take 20 mg by mouth.  Take 20 mg by mouth in the morning and an additional 20 mg for a weight gain of 3 pounds in one day or 5 pounds in one week) 30 tablet 0   traZODone (DESYREL) 50 MG tablet Take 50 mg by mouth at bedtime.     TYLENOL 500 MG tablet Take 1,000 mg by mouth every 6 (six) hours as needed for mild pain or headache.     TYLENOL PM EXTRA STRENGTH 500-25 MG TABS tablet Take 2 tablets by mouth at bedtime.     Vitamin D, Ergocalciferol, (DRISDOL) 1.25 MG (50000 UNIT) CAPS capsule TAKE 1 CAPSULE BY MOUTH ONCE WEEKLY (Patient taking differently: Take 50,000 Units by mouth every Monday.) 4 capsule 1   SYMBICORT 160-4.5 MCG/ACT inhaler Inhale 2 puffs by mouth twice daily (Patient taking differently: Inhale 2 puffs into the lungs 2 (two) times daily as needed (for flares).) 11 g 0   fluticasone (FLONASE) 50  MCG/ACT nasal spray Place 2 sprays into both nostrils daily for 14 days. (Patient taking differently: Place 2 sprays into both nostrils every other day.) 1 g 0   No facility-administered medications prior to visit.    Family History  Problem Relation Age of Onset   Breast cancer Mother 25   Heart disease Mother    Throat cancer Father    Hypertension Father    Arthritis Father    Diabetes Father    Arthritis Sister    Obesity Sister    Diabetes Sister    Heart disease Cousin    Colon cancer Neg Hx    Stomach cancer Neg Hx    Esophageal cancer Neg Hx     Social History   Socioeconomic History   Marital status: Widowed    Spouse name: Not on file   Number of children: 3   Years of education: Not on file   Highest education level: High school graduate  Occupational History   Occupation: retired  Tobacco Use   Smoking status: Never    Passive exposure: Past   Smokeless tobacco: Never   Tobacco comments:    Both parents smoked, patient was exposed/ "Raised up in smoke, my whole life."  Vaping Use   Vaping status: Never Used  Substance and Sexual Activity   Alcohol use: No    Alcohol/week: 0.0 standard drinks of alcohol   Drug use: No   Sexual activity: Not on file  Other Topics Concern   Not on file  Social History Narrative   Not on file   Social Determinants of Health   Financial Resource Strain: Low Risk  (06/05/2022)   Overall Financial Resource Strain (CARDIA)    Difficulty of Paying Living Expenses: Not hard at all  Food Insecurity: No Food Insecurity (01/09/2023)   Hunger Vital Sign    Worried About Running Out of Food in the Last Year: Never true    Ran Out of Food in the Last Year: Never true  Transportation Needs: No Transportation Needs (01/03/2023)   PRAPARE - Administrator, Civil Service (Medical): No    Lack of Transportation (Non-Medical): No  Physical Activity: Insufficiently Active (06/05/2022)   Exercise Vital Sign    Days of  Exercise per Week: 3 days    Minutes of Exercise per Session: 10 min  Stress: Stress Concern Present (06/05/2022)   [employer] of Occupational Health - Occupational Stress Questionnaire    Feeling of Stress : Rather much  Social Connections: Moderately Isolated (04/20/2022)  Social Advertising account executive [NHANES]    Frequency of Communication with Friends and Family: Once a week    Frequency of Social Gatherings with Friends and Family: Once a week    Attends Religious Services: 1 to 4 times per year    Active Member of [employer] or Organizations: Yes    Attends Banker Meetings: 1 to 4 times per year    Marital Status: Widowed  Intimate Partner Violence: Not At Risk (01/03/2023)   Humiliation, Afraid, Rape, and Kick questionnaire    Fear of Current or Ex-Partner: No    Emotionally Abused: No    Physically Abused: No    Sexually Abused: No                                                                                                  Objective:  Physical Exam: BP 130/74 (BP Location: Left Arm, Patient Position: Sitting, Cuff Size: Large)   Pulse 70   Temp 97.8 F (36.6 C) (Temporal)   SpO2 94%     Physical Exam Constitutional:      Appearance: She is obese. She is not ill-appearing.  HENT:     Head: Normocephalic and atraumatic.     Right Ear: Hearing normal. A middle ear effusion is present.     Left Ear: Hearing normal.     Nose: Nose normal.  Eyes:     General: No scleral icterus.       Right eye: No discharge.        Left eye: No discharge.     Extraocular Movements: Extraocular movements intact.  Cardiovascular:     Rate and Rhythm: Normal rate and regular rhythm.     Heart sounds: Normal heart sounds.     Comments: No cyanosis, no JVD Pulmonary:     Effort: Pulmonary effort is normal.     Breath sounds: Normal breath sounds. No decreased air movement.     Comments: No auditory wheezing Abdominal:     General: Abdomen is flat. There  is no distension.     Palpations: Abdomen is soft.     Tenderness: There is no abdominal tenderness.  Musculoskeletal:     Comments: Normal Ambulation. No clubbing  Skin:    General: Skin is warm.     Findings: No rash.  Neurological:     General: No focal deficit present.     Mental Status: She is alert.     Cranial Nerves: No cranial nerve deficit.     Motor: Weakness (Wheelchair-bound) present.  Psychiatric:        Attention and Perception: She is attentive.        Judgment: Judgment is not impulsive.     CT ABDOMEN PELVIS WO CONTRAST  Result Date: 01/03/2023 CLINICAL DATA:  Epigastric pain. EXAM: CT ABDOMEN AND PELVIS WITHOUT CONTRAST TECHNIQUE: Multidetector CT imaging of the abdomen and pelvis was performed following the standard protocol without IV contrast. RADIATION DOSE REDUCTION: This exam was performed according to the departmental dose-optimization program which includes automated exposure control, adjustment of the mA and/or kV  according to patient size and/or use of iterative reconstruction technique. COMPARISON:  CT abdomen pelvis dated 03/26/2022. FINDINGS: Evaluation of this exam is limited in the absence of intravenous contrast. Lower chest: The visualized lung bases are clear. There is advanced 3 vessel coronary vascular calcification. No intra-abdominal free air or free fluid. Hepatobiliary: The liver is unremarkable. No biliary dilatation. Multiple gallstones. No pericholecystic fluid or evidence of acute cholecystitis by CT. Pancreas: Unremarkable. No pancreatic ductal dilatation or surrounding inflammatory changes. Spleen: Normal in size without focal abnormality. Adrenals/Urinary Tract: The adrenal glands are unremarkable. There is no hydronephrosis or nephrolithiasis on either side. Bilateral renal cysts. There is no hydronephrosis or nephrolithiasis on either side. The visualized ureters and urinary bladder appear unremarkable. Stomach/Bowel: There is postsurgical  changes of bowel with multiple anastomotic staple lines. There is no bowel obstruction or active inflammation. Appendectomy. Vascular/Lymphatic: Mild aortoiliac atherosclerotic disease. The IVC is unremarkable. No portal venous gas. There is no adenopathy. Reproductive: The uterus is anteverted and grossly unremarkable. No adnexal masses. Other: Anterior abdominal wall hernia repair.  No recurrent hernia. Musculoskeletal: Osteopenia with degenerative changes. No acute osseous pathology. IMPRESSION: 1. No acute intra-abdominal or pelvic pathology. 2. Cholelithiasis. 3.  Aortic Atherosclerosis (ICD10-I70.0). Electronically Signed   By: Elgie Collard M.D.   On: 01/03/2023 21:32   CT Chest Wo Contrast  Result Date: 12/29/2022 CLINICAL DATA:  Chest pain, abnormal chest x-ray EXAM: CT CHEST WITHOUT CONTRAST TECHNIQUE: Multidetector CT imaging of the chest was performed following the standard protocol without IV contrast. RADIATION DOSE REDUCTION: This exam was performed according to the departmental dose-optimization program which includes automated exposure control, adjustment of the mA and/or kV according to patient size and/or use of iterative reconstruction technique. COMPARISON:  Chest x-ray dated December 29, 2022; CT chest, abdomen and pelvis dated February 08, 2022; chest CT dated July 04, 2021 FINDINGS: Cardiovascular: Normal heart size. No pericardial effusion. Normal caliber thoracic aorta with moderate atherosclerotic disease. Severe coronary artery calcifications. Mediastinum/Nodes: Small hiatal hernia. Thyroid is unremarkable. No enlarged lymph nodes seen in the chest. Lungs/Pleura: Central airways are patent. No consolidation, pleural effusion or pneumothorax. No reticular opacities, traction bronchiectasis or honeycomb change. Small solid nodule of the left upper lobe measuring 4 mm on series 4, image 138, no follow-up imaging is necessary given greater than 1 year stability. Upper Abdomen:  Gallstones. Low-attenuation right renal lesions which are likely simple cysts. Musculoskeletal: No chest wall mass or suspicious bone lesions identified. IMPRESSION: 1. No acute findings in the chest or evidence interstitial lung disease. 2. Coronary artery calcifications aortic Atherosclerosis (ICD10-I70.0). Electronically Signed   By: Allegra Lai M.D.   On: 12/29/2022 17:03   DG Chest Port 1 View  Result Date: 12/29/2022 CLINICAL DATA:  Chest pain. EXAM: PORTABLE CHEST 1 VIEW COMPARISON:  Chest x-ray dated July 12, 2022. FINDINGS: The heart size and mediastinal contours are within normal limits. Mildly increased ill-defined interstitial opacities, worst at the lung bases. No focal consolidation, pleural effusion, or pneumothorax. No acute osseous abnormality. IMPRESSION: 1. Increased ill-defined interstitial opacities, worst at the lung bases, concerning for atypical infection or mild interstitial pulmonary edema. Electronically Signed   By: Obie Dredge M.D.   On: 12/29/2022 13:44    Recent Results (from the past 2160 hour(s))  SARS Coronavirus 2 by RT PCR (hospital order, performed in [facility] hospital lab) *cepheid single result test* Anterior Nasal Swab     Status: None   Collection Time: 12/29/22 12:37 PM  Specimen: Anterior Nasal Swab  Result Value Ref Range   SARS Coronavirus 2 by RT PCR NEGATIVE NEGATIVE    Comment: Performed at [facility], [address], [place], [place] [address]  CBC     Status: Abnormal   Collection Time: 12/29/22 12:39 PM  Result Value Ref Range   WBC 12.4 (H) 4.0 - 10.5 K/uL   RBC 2.75 (L) 3.87 - 5.11 MIL/uL   Hemoglobin 7.6 (L) 12.0 - 15.0 g/dL   HCT 81.1 (L) 91.4 - 78.2 %   MCV 92.0 80.0 - 100.0 fL   MCH 27.6 26.0 - 34.0 pg   MCHC 30.0 30.0 - 36.0 g/dL   RDW 95.6 21.3 - 08.6 %   Platelets 246 150 - 400 K/uL   nRBC 0.0 0.0 - 0.2 %    Comment: Performed at [facility], [address], [place], [place] [address]  Troponin I (High  Sensitivity)     Status: None   Collection Time: 12/29/22 12:39 PM  Result Value Ref Range   Troponin I (High Sensitivity) 13 <18 ng/L    Comment: (NOTE) Elevated high sensitivity troponin I (hsTnI) values and significant  changes across serial measurements may suggest ACS but many other  chronic and acute conditions are known to elevate hsTnI results.  Refer to the "Links" section for chest pain algorithms and additional  guidance. Performed at [facility], [address] [place], [place] [address]   Comprehensive metabolic panel     Status: Abnormal   Collection Time: 12/29/22 12:39 PM  Result Value Ref Range   Sodium 137 135 - 145 mmol/L   Potassium 4.2 3.5 - 5.1 mmol/L   Chloride 100 98 - 111 mmol/L   CO2 21 (L) 22 - 32 mmol/L   Glucose, Bld 218 (H) 70 - 99 mg/dL    Comment: Glucose reference range applies only to samples taken after fasting for at least 8 hours.   BUN 100 (H) 8 - 23 mg/dL   Creatinine, Ser 2.84 (H) 0.44 - 1.00 mg/dL   Calcium 8.5 (L) 8.9 - 10.3 mg/dL   Total Protein 6.9 6.5 - 8.1 g/dL   Albumin 2.6 (L) 3.5 - 5.0 g/dL   AST 16 15 - 41 U/L   ALT 12 0 - 44 U/L   Alkaline Phosphatase 82 38 - 126 U/L   Total Bilirubin 0.4 0.3 - 1.2 mg/dL   GFR, Estimated 16 (L) >60 mL/min    Comment: (NOTE) Calculated using the CKD-EPI Creatinine Equation (2021)    Anion gap 16 (H) 5 - 15    Comment: Performed at [facility], [address], [place], [place] [address]  D-dimer, quantitative     Status: Abnormal   Collection Time: 12/29/22 12:39 PM  Result Value Ref Range   D-Dimer, Quant 0.55 (H) 0.00 - 0.50 ug/mL-FEU    Comment: (NOTE) At the manufacturer cut-off value of 0.5 g/mL FEU, this assay has a negative predictive value of 95-100%.This assay is intended for use in conjunction with a clinical pretest probability (PTP) assessment model to exclude pulmonary embolism (PE) and deep venous thrombosis (DVT) in outpatients suspected of PE or  DVT. Results should be correlated with clinical presentation. Performed at [facility], [address] [place], [place] [address]   POC occult blood, ED Provider will collect     Status: None   Collection Time: 12/29/22  1:26 PM  Result Value Ref Range  Fecal Occult Bld NEGATIVE NEGATIVE  Urinalysis, Routine w reflex microscopic -Urine, Clean Catch     Status: Abnormal   Collection Time: 12/29/22  2:42 PM  Result Value Ref Range   Color, Urine YELLOW YELLOW   APPearance HAZY (A) CLEAR   Specific Gravity, Urine 1.009 1.005 - 1.030   pH 7.0 5.0 - 8.0   Glucose, UA NEGATIVE NEGATIVE mg/dL   Hgb urine dipstick NEGATIVE NEGATIVE   Bilirubin Urine NEGATIVE NEGATIVE   Ketones, ur NEGATIVE NEGATIVE mg/dL   Protein, ur NEGATIVE NEGATIVE mg/dL   Nitrite NEGATIVE NEGATIVE   Leukocytes,Ua LARGE (A) NEGATIVE   RBC / HPF 0-5 0 - 5 RBC/hpf   WBC, UA >50 0 - 5 WBC/hpf   Bacteria, UA FEW (A) NONE SEEN   Squamous Epithelial / HPF 0-5 0 - 5 /HPF   Mucus PRESENT     Comment: Performed at [facility], [address], [place], [place] [address]  Troponin I (High Sensitivity)     Status: None   Collection Time: 12/29/22  2:42 PM  Result Value Ref Range   Troponin I (High Sensitivity) 11 <18 ng/L    Comment: (NOTE) Elevated high sensitivity troponin I (hsTnI) values and significant  changes across serial measurements may suggest ACS but many other  chronic and acute conditions are known to elevate hsTnI results.  Refer to the "Links" section for chest pain algorithms and additional  guidance. Performed at [facility], [address] [place], [place] [address]   Iron and Iron Binding Capacity ([facility] only)     Status: Abnormal   Collection Time: 01/03/23  2:15 PM  Result Value Ref Range   Iron 15 (L) 28 - 170 ug/dL   TIBC 371 (L) 696 - 789 ug/dL   Saturation Ratios 8 (L) 10.4 - 31.8 %   UIBC 178 ug/dL    Comment: Performed at [facility], [address] [place], [place] [address]  CMP (Cancer Center only)     Status: Abnormal   Collection Time: 01/03/23  2:15 PM  Result Value Ref Range   Sodium 138 135 - 145 mmol/L   Potassium 4.3 3.5 - 5.1 mmol/L   Chloride 105 98 - 111 mmol/L   CO2 29 22 - 32 mmol/L   Glucose, Bld 144 (H) 70 - 99 mg/dL    Comment: Glucose reference range applies only to samples taken after fasting for at least 8 hours.   BUN 77 (H) 8 - 23 mg/dL   Creatinine 7.51 (H) 0.25 - 1.00 mg/dL   Calcium 8.5 (L) 8.9 - 10.3 mg/dL   Total Protein 6.8 6.5 - 8.1 g/dL   Albumin 3.0 (L) 3.5 - 5.0 g/dL   AST 20 15 - 41 U/L   ALT 12 0 - 44 U/L   Alkaline Phosphatase 73 38 - 126 U/L   Total Bilirubin 0.3 0.3 - 1.2 mg/dL   GFR, Estimated 21 (L) >60 mL/min    Comment: (NOTE) Calculated using the CKD-EPI Creatinine Equation (2021)    Anion gap 4 (L) 5 - 15    Comment: Performed at [facility], [address] [place], [place] [address]  CBC with Differential (Cancer Center Only)     Status: Abnormal   Collection Time: 01/03/23  2:15 PM  Result Value Ref Range   WBC Count 6.4 4.0 - 10.5 K/uL   RBC 2.53 (L) 3.87 - 5.11 MIL/uL   Hemoglobin  7.1 (L) 12.0 - 15.0 g/dL   HCT 16.1 (L) 09.6 - 04.5 %   MCV 91.3 80.0 - 100.0 fL   MCH 28.1 26.0 - 34.0 pg   MCHC 30.7 30.0 - 36.0 g/dL   RDW 40.9 81.1 - 91.4 %   Platelet Count 216 150 - 400 K/uL   nRBC 0.0 0.0 - 0.2 %   Neutrophils Relative % 67 %   Neutro Abs 4.2 1.7 - 7.7 K/uL   Lymphocytes Relative 18 %   Lymphs Abs 1.2 0.7 - 4.0 K/uL   Monocytes Relative 11 %   Monocytes Absolute 0.7 0.1 - 1.0 K/uL   Eosinophils Relative 4 %   Eosinophils Absolute 0.3 0.0 - 0.5 K/uL   Basophils Relative 0 %   Basophils Absolute 0.0 0.0 - 0.1 K/uL   Immature Granulocytes 0 %   Abs Immature Granulocytes 0.02 0.00 - 0.07 K/uL    Comment: Performed at [facility], [address], [place], [place] [address]  Ferritin     Status: None    Collection Time: 01/03/23  2:16 PM  Result Value Ref Range   Ferritin 77 11 - 307 ng/mL    Comment: Performed at [facility], [address] [place], [place] [address]  CBC with Differential     Status: Abnormal   Collection Time: 01/03/23  4:35 PM  Result Value Ref Range   WBC 7.6 4.0 - 10.5 K/uL   RBC 2.52 (L) 3.87 - 5.11 MIL/uL   Hemoglobin 7.0 (L) 12.0 - 15.0 g/dL   HCT 86.5 (L) 78.4 - 69.6 %   MCV 92.9 80.0 - 100.0 fL   MCH 27.8 26.0 - 34.0 pg   MCHC 29.9 (L) 30.0 - 36.0 g/dL   RDW 29.5 28.4 - 13.2 %   Platelets 213 150 - 400 K/uL   nRBC 0.0 0.0 - 0.2 %   Neutrophils Relative % 66 %   Neutro Abs 4.9 1.7 - 7.7 K/uL   Lymphocytes Relative 19 %   Lymphs Abs 1.4 0.7 - 4.0 K/uL   Monocytes Relative 11 %   Monocytes Absolute 0.9 0.1 - 1.0 K/uL   Eosinophils Relative 4 %   Eosinophils Absolute 0.3 0.0 - 0.5 K/uL   Basophils Relative 0 %   Basophils Absolute 0.0 0.0 - 0.1 K/uL   Immature Granulocytes 0 %   Abs Immature Granulocytes 0.02 0.00 - 0.07 K/uL    Comment: Performed at [facility], [address], [place], [place] [address]  Comprehensive metabolic panel     Status: Abnormal   Collection Time: 01/03/23  4:35 PM  Result Value Ref Range   Sodium 137 135 - 145 mmol/L   Potassium 4.6 3.5 - 5.1 mmol/L   Chloride 103 98 - 111 mmol/L   CO2 25 22 - 32 mmol/L   Glucose, Bld 142 (H) 70 - 99 mg/dL    Comment: Glucose reference range applies only to samples taken after fasting for at least 8 hours.   BUN 75 (H) 8 - 23 mg/dL   Creatinine, Ser 2.72 (H) 0.44 - 1.00 mg/dL   Calcium 8.7 (L) 8.9 - 10.3 mg/dL   Total Protein 6.7 6.5 - 8.1 g/dL   Albumin 2.7 (L) 3.5 - 5.0 g/dL   AST 22 15 - 41 U/L   ALT 16 0 - 44 U/L   Alkaline Phosphatase 69 38 - 126 U/L   Total Bilirubin 0.4 0.3 - 1.2 mg/dL  GFR, Estimated 20 (L) >60 mL/min    Comment: (NOTE) Calculated using the CKD-EPI Creatinine Equation (2021)    Anion gap 9 5 - 15    Comment:  Performed at [facility], [address], [place], [place] [address]  Hemoglobin A1c     Status: Abnormal   Collection Time: 01/03/23  4:35 PM  Result Value Ref Range   Hgb A1c MFr Bld 7.2 (H) 4.8 - 5.6 %    Comment: (NOTE) Pre diabetes:          5.7%-6.4%  Diabetes:              >6.4%  Glycemic control for   <7.0% adults with diabetes    Mean Plasma Glucose 159.94 mg/dL    Comment: Performed at [facility], [address] [place], [place] [address]  Reticulocytes     Status: Abnormal   Collection Time: 01/03/23  4:35 PM  Result Value Ref Range   Retic Ct Pct 2.0 0.4 - 3.1 %   RBC. 2.52 (L) 3.87 - 5.11 MIL/uL   Retic Count, Absolute 51.4 19.0 - 186.0 K/uL   Immature Retic Fract 20.6 (H) 2.3 - 15.9 %    Comment: Performed at [facility], [address] [place], [place] [address]  TSH     Status: None   Collection Time: 01/03/23  4:35 PM  Result Value Ref Range   TSH 0.804 0.350 - 4.500 uIU/mL    Comment: Performed by a 3rd Generation assay with a functional sensitivity of <=0.01 uIU/mL. Performed at [facility], [address], [place], [place] [address]   Phosphorus     Status: None   Collection Time: 01/03/23  4:35 PM  Result Value Ref Range   Phosphorus 3.2 2.5 - 4.6 mg/dL    Comment: Performed at [facility], [address], [place], [place] [address]  Magnesium     Status: Abnormal   Collection Time: 01/03/23  4:35 PM  Result Value Ref Range   Magnesium 1.5 (L) 1.7 - 2.4 mg/dL    Comment: Performed at [facility], [address], [place], [place] [address]  CK     Status: None   Collection Time: 01/03/23  4:35 PM  Result Value Ref Range   Total CK 89 38 - 234 U/L    Comment: Performed at [facility], [address], [place], [place] [address]  Type and screen [facility]     Status: None   Collection Time: 01/03/23  6:00  PM  Result Value Ref Range   ABO/RH(D) A POS    Antibody Screen NEG    Sample Expiration 01/06/2023,2359    Unit Number [id number]    Blood Component Type RED CELLS,LR    Unit division 00    Status of Unit ISSUED,FINAL    Transfusion Status OK TO TRANSFUSE    Crossmatch Result Compatible    Unit Number [id number]    Blood Component Type RED CELLS,LR    Unit division 00    Status of Unit ISSUED,FINAL    Transfusion Status OK TO TRANSFUSE    Crossmatch Result      Compatible Performed at [facility], [address], [place], [place] [address]   Prepare RBC (crossmatch)     Status: None   Collection Time: 01/03/23  6:00 PM  Result Value Ref Range   Order Confirmation  ORDER PROCESSED BY BLOOD BANK Performed at [facility], [address], [place], [place] [address]   BPAM RBC     Status: None   Collection Time: 01/03/23  6:00 PM  Result Value Ref Range   ISSUE DATE / TIME [id number]    Blood Product Unit Number [id number]    PRODUCT CODE [id number]    Unit Type and Rh 6200    Blood Product Expiration Date 401027253664    ISSUE DATE / TIME 403474259563    Blood Product Unit Number [id number]    PRODUCT CODE [id number]    Unit Type and Rh 6200    Blood Product Expiration Date 160109323557   POC occult blood, ED Provider will collect     Status: Abnormal   Collection Time: 01/03/23  6:11 PM  Result Value Ref Range   Fecal Occult Bld POSITIVE (A) NEGATIVE  CBG monitoring, ED     Status: Abnormal   Collection Time: 01/03/23  8:03 PM  Result Value Ref Range   Glucose-Capillary 145 (H) 70 - 99 mg/dL    Comment: Glucose reference range applies only to samples taken after fasting for at least 8 hours.  Glucose, capillary     Status: Abnormal   Collection Time: 01/03/23 10:28 PM  Result Value Ref Range   Glucose-Capillary 117 (H) 70 - 99 mg/dL    Comment: Glucose reference range applies only to samples taken after fasting  for at least 8 hours.  Glucose, capillary     Status: Abnormal   Collection Time: 01/04/23  3:55 AM  Result Value Ref Range   Glucose-Capillary 118 (H) 70 - 99 mg/dL    Comment: Glucose reference range applies only to samples taken after fasting for at least 8 hours.  Vitamin B12     Status: None   Collection Time: 01/04/23  4:25 AM  Result Value Ref Range   Vitamin B-12 367 180 - 914 pg/mL    Comment: (NOTE) This assay is not validated for testing neonatal or myeloproliferative syndrome specimens for Vitamin B12 levels. Performed at [facility], [address] [place], [place] [address]   Folate     Status: None   Collection Time: 01/04/23  4:25 AM  Result Value Ref Range   Folate 38.5 >5.9 ng/mL    Comment: Performed at [facility], [address], [place], [place] [address]  Prealbumin     Status: Abnormal   Collection Time: 01/04/23  4:25 AM  Result Value Ref Range   Prealbumin 10 (L) 18 - 38 mg/dL    Comment: Performed at [facility], [address] [place], [place] [address]  Magnesium     Status: None   Collection Time: 01/04/23  4:25 AM  Result Value Ref Range   Magnesium 1.7 1.7 - 2.4 mg/dL    Comment: Performed at [facility], [address], [place], [place] [address]  Phosphorus     Status: None   Collection Time: 01/04/23  4:25 AM  Result Value Ref Range   Phosphorus 3.0 2.5 - 4.6 mg/dL    Comment: Performed at [facility], [address], [place], [place] [address]  Comprehensive metabolic panel     Status: Abnormal   Collection Time: 01/04/23  4:25 AM  Result Value Ref Range   Sodium 132 (L) 135 - 145 mmol/L   Potassium 4.0 3.5 - 5.1 mmol/L   Chloride 101 98 - 111 mmol/L  CO2 22 22 - 32 mmol/L   Glucose, Bld 117 (H) 70 - 99 mg/dL    Comment: Glucose reference range applies only to samples taken after fasting for at least 8 hours.   BUN 73 (H) 8 - 23 mg/dL    Creatinine, Ser 1.61 (H) 0.44 - 1.00 mg/dL   Calcium 7.8 (L) 8.9 - 10.3 mg/dL   Total Protein 6.3 (L) 6.5 - 8.1 g/dL   Albumin 2.4 (L) 3.5 - 5.0 g/dL   AST 20 15 - 41 U/L   ALT 15 0 - 44 U/L   Alkaline Phosphatase 66 38 - 126 U/L   Total Bilirubin 0.8 0.3 - 1.2 mg/dL   GFR, Estimated 22 (L) >60 mL/min    Comment: (NOTE) Calculated using the CKD-EPI Creatinine Equation (2021)    Anion gap 9 5 - 15    Comment: Performed at [facility], [address] [place] [address], [place], [place] [address]  CBC     Status: Abnormal   Collection Time: 01/04/23  4:25 AM  Result Value Ref Range   WBC 8.3 4.0 - 10.5 K/uL   RBC 2.71 (L) 3.87 - 5.11 MIL/uL   Hemoglobin 7.8 (L) 12.0 - 15.0 g/dL   HCT 54.0 (L) 98.1 - 19.1 %   MCV 91.5 80.0 - 100.0 fL   MCH 28.8 26.0 - 34.0 pg   MCHC 31.5 30.0 - 36.0 g/dL   RDW 47.8 29.5 - 62.1 %   Platelets 194 150 - 400 K/uL   nRBC 0.0 0.0 - 0.2 %    Comment: Performed at [facility], [address] [place], [place] [address]  Iron and TIBC     Status: Abnormal   Collection Time: 01/04/23  4:25 AM  Result Value Ref Range   Iron 62 28 - 170 ug/dL   TIBC 784 (L) 696 - 295 ug/dL   Saturation Ratios 38 (H) 10.4 - 31.8 %   UIBC 103 ug/dL    Comment: Performed at [facility], [address] [place], [place] [address]  Ferritin     Status: None   Collection Time: 01/04/23  4:25 AM  Result Value Ref Range   Ferritin 65 11 - 307 ng/mL    Comment: Performed at [facility], [address], [place], [place] [address]  Glucose, capillary     Status: None   Collection Time: 01/04/23  7:35 AM  Result Value Ref Range   Glucose-Capillary 82 70 - 99 mg/dL    Comment: Glucose reference range applies only to samples taken after fasting for at least 8 hours.  CBC     Status: Abnormal   Collection Time: 01/04/23 10:15 AM  Result Value Ref Range   WBC 8.3 4.0 - 10.5 K/uL   RBC 3.04 (L) 3.87 - 5.11 MIL/uL    Hemoglobin 8.6 (L) 12.0 - 15.0 g/dL   HCT 02.7 (L) 25.3 - 66.4 %   MCV 91.1 80.0 - 100.0 fL   MCH 28.3 26.0 - 34.0 pg   MCHC 31.0 30.0 - 36.0 g/dL   RDW 40.3 47.4 - 25.9 %   Platelets 195 150 - 400 K/uL   nRBC 0.0 0.0 - 0.2 %    Comment: Performed at [facility], [address] [place], [place] [address]  Glucose, capillary     Status: None   Collection Time: 01/04/23 11:40 AM  Result Value Ref Range   Glucose-Capillary 94 70 - 99  mg/dL    Comment: Glucose reference range applies only to samples taken after fasting for at least 8 hours.  CBC     Status: Abnormal   Collection Time: 01/04/23  2:16 PM  Result Value Ref Range   WBC 8.7 4.0 - 10.5 K/uL   RBC 3.15 (L) 3.87 - 5.11 MIL/uL   Hemoglobin 9.0 (L) 12.0 - 15.0 g/dL   HCT 16.1 (L) 09.6 - 04.5 %   MCV 94.0 80.0 - 100.0 fL   MCH 28.6 26.0 - 34.0 pg   MCHC 30.4 30.0 - 36.0 g/dL   RDW 40.9 81.1 - 91.4 %   Platelets 194 150 - 400 K/uL   nRBC 0.0 0.0 - 0.2 %    Comment: Performed at [facility], [address], [place], [place] [address]  Glucose, capillary     Status: Abnormal   Collection Time: 01/04/23  4:51 PM  Result Value Ref Range   Glucose-Capillary 172 (H) 70 - 99 mg/dL    Comment: Glucose reference range applies only to samples taken after fasting for at least 8 hours.  CBC     Status: Abnormal   Collection Time: 01/04/23  7:20 PM  Result Value Ref Range   WBC 7.1 4.0 - 10.5 K/uL   RBC 2.83 (L) 3.87 - 5.11 MIL/uL   Hemoglobin 8.0 (L) 12.0 - 15.0 g/dL   HCT 62.1 (L) 30.8 - 65.7 %   MCV 93.3 80.0 - 100.0 fL   MCH 28.3 26.0 - 34.0 pg   MCHC 30.3 30.0 - 36.0 g/dL   RDW 84.6 96.2 - 95.2 %   Platelets 182 150 - 400 K/uL   nRBC 0.0 0.0 - 0.2 %    Comment: Performed at [facility], [address] [place], [place] [address]  Glucose, capillary     Status: Abnormal   Collection Time: 01/04/23  8:39 PM  Result Value Ref Range   Glucose-Capillary 181 (H) 70 - 99  mg/dL    Comment: Glucose reference range applies only to samples taken after fasting for at least 8 hours.  Glucose, capillary     Status: None   Collection Time: 01/04/23 11:59 PM  Result Value Ref Range   Glucose-Capillary 93 70 - 99 mg/dL    Comment: Glucose reference range applies only to samples taken after fasting for at least 8 hours.   Comment 1 Notify RN   CBC with Differential/Platelet     Status: Abnormal   Collection Time: 01/05/23  1:00 AM  Result Value Ref Range   WBC 7.3 4.0 - 10.5 K/uL   RBC 2.77 (L) 3.87 - 5.11 MIL/uL   Hemoglobin 7.7 (L) 12.0 - 15.0 g/dL   HCT 44.0 (L) 10.2 - 72.5 %   MCV 92.1 80.0 - 100.0 fL   MCH 27.8 26.0 - 34.0 pg   MCHC 30.2 30.0 - 36.0 g/dL   RDW 36.6 44.0 - 34.7 %   Platelets 188 150 - 400 K/uL   nRBC 0.0 0.0 - 0.2 %   Neutrophils Relative % 60 %   Neutro Abs 4.4 1.7 - 7.7 K/uL   Lymphocytes Relative 26 %   Lymphs Abs 1.9 0.7 - 4.0 K/uL   Monocytes Relative 9 %   Monocytes Absolute 0.7 0.1 - 1.0 K/uL   Eosinophils Relative 5 %   Eosinophils Absolute 0.3 0.0 - 0.5 K/uL   Basophils Relative 0 %   Basophils Absolute 0.0 0.0 - 0.1 K/uL  Immature Granulocytes 0 %   Abs Immature Granulocytes 0.03 0.00 - 0.07 K/uL    Comment: Performed at [facility], [address], [place], [place] [address]  Basic metabolic panel     Status: Abnormal   Collection Time: 01/05/23  1:02 AM  Result Value Ref Range   Sodium 136 135 - 145 mmol/L   Potassium 4.7 3.5 - 5.1 mmol/L   Chloride 107 98 - 111 mmol/L   CO2 22 22 - 32 mmol/L   Glucose, Bld 83 70 - 99 mg/dL    Comment: Glucose reference range applies only to samples taken after fasting for at least 8 hours.   BUN 59 (H) 8 - 23 mg/dL   Creatinine, Ser 8.11 (H) 0.44 - 1.00 mg/dL   Calcium 8.0 (L) 8.9 - 10.3 mg/dL   GFR, Estimated 24 (L) >60 mL/min    Comment: (NOTE) Calculated using the CKD-EPI Creatinine Equation (2021)    Anion gap 7 5 - 15    Comment: Performed at [facility], [address], [place], [place] [address]  Glucose, capillary     Status: None   Collection Time: 01/05/23  4:21 AM  Result Value Ref Range   Glucose-Capillary 81 70 - 99 mg/dL    Comment: Glucose reference range applies only to samples taken after fasting for at least 8 hours.   Comment 1 Document in Chart   Glucose, capillary     Status: None   Collection Time: 01/05/23  7:30 AM  Result Value Ref Range   Glucose-Capillary 92 70 - 99 mg/dL    Comment: Glucose reference range applies only to samples taken after fasting for at least 8 hours.  Glucose, capillary     Status: None   Collection Time: 01/05/23 11:48 AM  Result Value Ref Range   Glucose-Capillary 99 70 - 99 mg/dL    Comment: Glucose reference range applies only to samples taken after fasting for at least 8 hours.  Prepare RBC (crossmatch)     Status: None   Collection Time: 01/05/23  3:02 PM  Result Value Ref Range   Order Confirmation      ORDER PROCESSED BY BLOOD BANK Performed at [facility], [address] [place], [place] [address]   Glucose, capillary     Status: Abnormal   Collection Time: 01/05/23  5:37 PM  Result Value Ref Range   Glucose-Capillary 125 (H) 70 - 99 mg/dL    Comment: Glucose reference range applies only to samples taken after fasting for at least 8 hours.  Glucose, capillary     Status: Abnormal   Collection Time: 01/05/23  8:20 PM  Result Value Ref Range   Glucose-Capillary 253 (H) 70 - 99 mg/dL    Comment: Glucose reference range applies only to samples taken after fasting for at least 8 hours.   Comment 1 Notify RN   Glucose, capillary     Status: Abnormal   Collection Time: 01/06/23 12:01 AM  Result Value Ref Range   Glucose-Capillary 195 (H) 70 - 99 mg/dL    Comment: Glucose reference range applies only to samples taken after fasting for at least 8 hours.   Comment 1 Notify RN   CBC with Differential/Platelet     Status: Abnormal    Collection Time: 01/06/23  4:36 AM  Result Value Ref Range   WBC 9.7 4.0 - 10.5 K/uL   RBC 2.97 (L) 3.87 - 5.11 MIL/uL  Hemoglobin 8.5 (L) 12.0 - 15.0 g/dL   HCT 56.2 (L) 13.0 - 86.5 %   MCV 92.6 80.0 - 100.0 fL   MCH 28.6 26.0 - 34.0 pg   MCHC 30.9 30.0 - 36.0 g/dL   RDW 78.4 69.6 - 29.5 %   Platelets 181 150 - 400 K/uL   nRBC 0.0 0.0 - 0.2 %   Neutrophils Relative % 60 %   Neutro Abs 5.8 1.7 - 7.7 K/uL   Lymphocytes Relative 28 %   Lymphs Abs 2.8 0.7 - 4.0 K/uL   Monocytes Relative 7 %   Monocytes Absolute 0.7 0.1 - 1.0 K/uL   Eosinophils Relative 4 %   Eosinophils Absolute 0.4 0.0 - 0.5 K/uL   Basophils Relative 0 %   Basophils Absolute 0.0 0.0 - 0.1 K/uL   Immature Granulocytes 1 %   Abs Immature Granulocytes 0.05 0.00 - 0.07 K/uL    Comment: Performed at [facility], [address], [place], [place] [address]  Basic metabolic panel     Status: Abnormal   Collection Time: 01/06/23  4:36 AM  Result Value Ref Range   Sodium 138 135 - 145 mmol/L   Potassium 4.7 3.5 - 5.1 mmol/L   Chloride 107 98 - 111 mmol/L   CO2 23 22 - 32 mmol/L   Glucose, Bld 79 70 - 99 mg/dL    Comment: Glucose reference range applies only to samples taken after fasting for at least 8 hours.   BUN 55 (H) 8 - 23 mg/dL   Creatinine, Ser 2.44 (H) 0.44 - 1.00 mg/dL   Calcium 8.2 (L) 8.9 - 10.3 mg/dL   GFR, Estimated 27 (L) >60 mL/min    Comment: (NOTE) Calculated using the CKD-EPI Creatinine Equation (2021)    Anion gap 8 5 - 15    Comment: Performed at [facility], [address], [place], [place] [address]  Glucose, capillary     Status: Abnormal   Collection Time: 01/06/23  5:44 AM  Result Value Ref Range   Glucose-Capillary 66 (L) 70 - 99 mg/dL    Comment: Glucose reference range applies only to samples taken after fasting for at least 8 hours.  Glucose, capillary     Status: Abnormal   Collection Time: 01/06/23  6:14 AM  Result Value Ref Range    Glucose-Capillary 103 (H) 70 - 99 mg/dL    Comment: Glucose reference range applies only to samples taken after fasting for at least 8 hours.  Glucose, capillary     Status: Abnormal   Collection Time: 01/06/23  7:52 AM  Result Value Ref Range   Glucose-Capillary 115 (H) 70 - 99 mg/dL    Comment: Glucose reference range applies only to samples taken after fasting for at least 8 hours.  Glucose, capillary     Status: Abnormal   Collection Time: 01/06/23 12:27 PM  Result Value Ref Range   Glucose-Capillary 226 (H) 70 - 99 mg/dL    Comment: Glucose reference range applies only to samples taken after fasting for at least 8 hours.  Basic Metabolic Panel (BMET)     Status: Abnormal   Collection Time: 01/10/23  3:37 PM  Result Value Ref Range   Sodium 137 135 - 145 mEq/L   Potassium 5.1 3.5 - 5.1 mEq/L   Chloride 106 96 - 112 mEq/L   CO2 25 19 - 32 mEq/L   Glucose, Bld 170 (H) 70 - 99 mg/dL   BUN 49 (  H) 6 - 23 mg/dL   Creatinine, Ser 6.60 (H) 0.40 - 1.20 mg/dL   GFR 63.01 (L) >60.10 mL/min    Comment: Calculated using the CKD-EPI Creatinine Equation (2021)   Calcium 8.1 (L) 8.4 - 10.5 mg/dL  CBC w/Diff     Status: Abnormal   Collection Time: 01/10/23  3:37 PM  Result Value Ref Range   WBC 13.4 (H) 4.0 - 10.5 K/uL   RBC 3.43 (L) 3.87 - 5.11 Mil/uL   Hemoglobin 9.8 (L) 12.0 - 15.0 g/dL   HCT 93.2 (L) 35.5 - 73.2 %   MCV 89.5 78.0 - 100.0 fl   MCHC 31.7 30.0 - 36.0 g/dL   RDW 20.2 54.2 - 70.6 %   Platelets 229.0 150.0 - 400.0 K/uL   Neutrophils Relative % 78.4 (H) 43.0 - 77.0 %   Lymphocytes Relative 14.0 12.0 - 46.0 %   Monocytes Relative 5.7 3.0 - 12.0 %   Eosinophils Relative 1.5 0.0 - 5.0 %   Basophils Relative 0.4 0.0 - 3.0 %   Neutro Abs 10.5 (H) 1.4 - 7.7 K/uL   Lymphs Abs 1.9 0.7 - 4.0 K/uL   Monocytes Absolute 0.8 0.1 - 1.0 K/uL   Eosinophils Absolute 0.2 0.0 - 0.7 K/uL   Basophils Absolute 0.1 0.0 - 0.1 K/uL        Garner Nash, MD, MS

## 2023-01-11 ENCOUNTER — Telehealth: Payer: Self-pay | Admitting: Family Medicine

## 2023-01-11 DIAGNOSIS — H66004 Acute suppurative otitis media without spontaneous rupture of ear drum, recurrent, right ear: Secondary | ICD-10-CM | POA: Insufficient documentation

## 2023-01-11 DIAGNOSIS — J069 Acute upper respiratory infection, unspecified: Secondary | ICD-10-CM | POA: Insufficient documentation

## 2023-01-11 DIAGNOSIS — D62 Acute posthemorrhagic anemia: Secondary | ICD-10-CM | POA: Insufficient documentation

## 2023-01-11 DIAGNOSIS — J322 Chronic ethmoidal sinusitis: Secondary | ICD-10-CM | POA: Insufficient documentation

## 2023-01-11 LAB — BASIC METABOLIC PANEL
BUN: 49 mg/dL — ABNORMAL HIGH (ref 6–23)
CO2: 25 meq/L (ref 19–32)
Calcium: 8.1 mg/dL — ABNORMAL LOW (ref 8.4–10.5)
Chloride: 106 meq/L (ref 96–112)
Creatinine, Ser: 2.14 mg/dL — ABNORMAL HIGH (ref 0.40–1.20)
GFR: 20.51 mL/min — ABNORMAL LOW (ref >60.00)
Glucose, Bld: 170 mg/dL — ABNORMAL HIGH (ref 70–99)
Potassium: 5.1 meq/L (ref 3.5–5.1)
Sodium: 137 meq/L (ref 135–145)

## 2023-01-11 LAB — CBC WITH DIFFERENTIAL/PLATELET
Basophils Absolute: 0.1 10*3/uL (ref 0.0–0.1)
Basophils Relative: 0.4 % (ref 0.0–3.0)
Eosinophils Absolute: 0.2 10*3/uL (ref 0.0–0.7)
Eosinophils Relative: 1.5 % (ref 0.0–5.0)
HCT: 30.8 % — ABNORMAL LOW (ref 36.0–46.0)
Hemoglobin: 9.8 g/dL — ABNORMAL LOW (ref 12.0–15.0)
Lymphocytes Relative: 14 % (ref 12.0–46.0)
Lymphs Abs: 1.9 10*3/uL (ref 0.7–4.0)
MCHC: 31.7 g/dL (ref 30.0–36.0)
MCV: 89.5 fl (ref 78.0–100.0)
Monocytes Absolute: 0.8 10*3/uL (ref 0.1–1.0)
Monocytes Relative: 5.7 % (ref 3.0–12.0)
Neutro Abs: 10.5 10*3/uL — ABNORMAL HIGH (ref 1.4–7.7)
Neutrophils Relative %: 78.4 % — ABNORMAL HIGH (ref 43.0–77.0)
Platelets: 229 10*3/uL (ref 150.0–400.0)
RBC: 3.43 Mil/uL — ABNORMAL LOW (ref 3.87–5.11)
RDW: 15.4 % (ref 11.5–15.5)
WBC: 13.4 10*3/uL — ABNORMAL HIGH (ref 4.0–10.5)

## 2023-01-11 NOTE — Assessment & Plan Note (Signed)
Administer DuoNeb neb treatment in office, patient reports improved breathing after this. Review need for at-home nebulizer Change Symbicort to Trelegy 1 puff daily Monitor symptoms, follow up for persistent issues.

## 2023-01-11 NOTE — Assessment & Plan Note (Signed)
Symptomatic treatment with fluids and rest Continue Singulair and Flonase as prescribed

## 2023-01-11 NOTE — Assessment & Plan Note (Signed)
Check CBC to assess current hemoglobin levels Educate patient on monitoring for symptoms like dark stools and increased fatigue Consider further GI evaluation if anemia persists.

## 2023-01-11 NOTE — Telephone Encounter (Signed)
HH ORDERS   Caller Name: [facility] Agency Name: [facility] Callback Phone #: [phone]  Service Requested: OT  Frequency of Visits: 1xW every other week for 2 visits.

## 2023-01-11 NOTE — Assessment & Plan Note (Signed)
Prescribe Augmentin 835/125 mg BID for 10 days Monitor for symptom improvement, follow up if no improvement.

## 2023-01-15 NOTE — Telephone Encounter (Signed)
Left Stephanie at home health a detailed voice message with annotation below and to return call to office with any concerns.

## 2023-01-22 ENCOUNTER — Other Ambulatory Visit (HOSPITAL_COMMUNITY): Payer: Self-pay | Admitting: *Deleted

## 2023-01-22 ENCOUNTER — Telehealth: Payer: Self-pay | Admitting: Family Medicine

## 2023-01-22 NOTE — Telephone Encounter (Signed)
Given patient medical complexity, I would need to see patient to discuss with patient for an office visit before starting gabapentin.

## 2023-01-22 NOTE — Telephone Encounter (Signed)
Advised patient of annotation below and she scheduled for 09/17 at 1:20 pm.

## 2023-01-22 NOTE — Telephone Encounter (Signed)
Pt called and wanted to know if she be put on gabapentin

## 2023-01-23 ENCOUNTER — Telehealth: Payer: Self-pay | Admitting: Family Medicine

## 2023-01-23 ENCOUNTER — Ambulatory Visit (INDEPENDENT_AMBULATORY_CARE_PROVIDER_SITE_OTHER): Payer: PPO | Admitting: Family Medicine

## 2023-01-23 ENCOUNTER — Encounter: Payer: Self-pay | Admitting: Family Medicine

## 2023-01-23 ENCOUNTER — Ambulatory Visit: Payer: PPO | Admitting: Hematology

## 2023-01-23 ENCOUNTER — Other Ambulatory Visit: Payer: PPO

## 2023-01-23 VITALS — BP 130/78 | HR 70 | Temp 97.7°F | Wt 277.8 lb

## 2023-01-23 DIAGNOSIS — G4709 Other insomnia: Secondary | ICD-10-CM | POA: Diagnosis not present

## 2023-01-23 DIAGNOSIS — F411 Generalized anxiety disorder: Secondary | ICD-10-CM | POA: Diagnosis not present

## 2023-01-23 DIAGNOSIS — Z794 Long term (current) use of insulin: Secondary | ICD-10-CM | POA: Diagnosis not present

## 2023-01-23 DIAGNOSIS — I5032 Chronic diastolic (congestive) heart failure: Secondary | ICD-10-CM | POA: Diagnosis not present

## 2023-01-23 DIAGNOSIS — R0982 Postnasal drip: Secondary | ICD-10-CM | POA: Insufficient documentation

## 2023-01-23 DIAGNOSIS — E1140 Type 2 diabetes mellitus with diabetic neuropathy, unspecified: Secondary | ICD-10-CM | POA: Diagnosis not present

## 2023-01-23 DIAGNOSIS — J4489 Other specified chronic obstructive pulmonary disease: Secondary | ICD-10-CM | POA: Diagnosis not present

## 2023-01-23 MED ORDER — TORSEMIDE 20 MG PO TABS
20.0000 mg | ORAL_TABLET | Freq: Every day | ORAL | 0 refills | Status: DC
Start: 2023-01-23 — End: 2023-01-26

## 2023-01-23 MED ORDER — GABAPENTIN 10 % EX CREA: 1.0000 | TOPICAL_CREAM | Freq: Three times a day (TID) | CUTANEOUS | 1 refills | Status: DC | PRN

## 2023-01-23 MED ORDER — AZELASTINE HCL 0.1 % NA SOLN
2.0000 | Freq: Two times a day (BID) | NASAL | 12 refills | Status: DC
Start: 2023-01-23 — End: 2023-10-09

## 2023-01-23 MED ORDER — AMITRIPTYLINE HCL 50 MG PO TABS: 50.0000 mg | ORAL_TABLET | Freq: Every day | ORAL | 0 refills | Status: DC

## 2023-01-23 NOTE — Telephone Encounter (Signed)
error 

## 2023-01-23 NOTE — Telephone Encounter (Signed)
A representative from [employer] dropped off a form for Dr Janee Morn to sign and date. He would like this faxed back to the #  given on form.

## 2023-01-23 NOTE — Assessment & Plan Note (Signed)
Continue Flonase nasal spray Add azelastine nasal spray

## 2023-01-23 NOTE — Progress Notes (Signed)
Assessment/Plan:   Problem List Items Addressed This Visit       Cardiovascular and Mediastinum   Chronic diastolic CHF (congestive heart failure) (HCC)    Noted leg swelling, possible CHF-related  Plan:  Increase torsamide dosage to twice daily for 5 days and monitor weight and edema Follow-up as needed      Relevant Medications   torsemide (DEMADEX) 20 MG tablet     Respiratory   COPD with chronic bronchitis    Improved.  Continue to monitor symptoms.      Relevant Medications   azelastine (ASTELIN) 0.1 % nasal spray     Endocrine   Type 2 diabetes mellitus with diabetic neuropathy, with long-term current use of insulin (HCC) - Primary    Initiate amitriptyline 50mg  at night for pain, mood, and sleep Prescribe gabapentin ointment 10% (topical) - apply to the feet every 6 hours as needed      Relevant Medications   Gabapentin 10 % CREA   amitriptyline (ELAVIL) 50 MG tablet     Other   Insomnia (Chronic)   Relevant Medications   amitriptyline (ELAVIL) 50 MG tablet   GAD (generalized anxiety disorder)   Relevant Medications   amitriptyline (ELAVIL) 50 MG tablet   PND (post-nasal drip)    Continue Flonase nasal spray Add azelastine nasal spray      Relevant Medications   azelastine (ASTELIN) 0.1 % nasal spray    Medications Discontinued During This Encounter  Medication Reason   traZODone (DESYREL) 50 MG tablet    DULoxetine (CYMBALTA) 30 MG capsule    torsemide (DEMADEX) 20 MG tablet Reorder    Return in about 4 weeks (around 02/20/2023) for neuropathy, sore throat.    Subjective:   Encounter date: 01/23/2023  [name] is a 88 y.o. female who has Hyperlipidemia; Class 3 obesity (HCC); Symptomatic anemia; TENSION HEADACHE; Essential hypertension; History of DVT (deep vein thrombosis); Chronic diastolic CHF (congestive heart failure) (HCC); Long term current use of anticoagulant therapy; Insomnia; Estrogen deficiency; COPD with chronic  bronchitis; Drug-induced constipation; Vertigo; Gout; Acute kidney injury superimposed on chronic kidney disease (HCC); Vitamin D deficiency; Endometrial polyp; GERD with esophagitis; Dementia (HCC); Type 2 diabetes mellitus with hyperlipidemia (HCC); Moderate nonproliferative diabetic retinopathy of both eyes without macular edema associated with type 2 diabetes mellitus (HCC); Chronic radicular lumbar pain; Lumbar spondylosis; Spinal stenosis, lumbar region, with neurogenic claudication; Bilateral primary osteoarthritis of knee; Iron deficiency anemia; Chronic pain of right hand; Breast cancer (HCC); Severe episode of recurrent major depressive disorder, without psychotic features (HCC); Pneumobilia; Dermatosis papulosa nigra; CKD (chronic kidney disease), stage IV (HCC); Type 2 diabetes mellitus with stage 4 chronic kidney disease, with long-term current use of insulin (HCC); Type 2 diabetes mellitus with diabetic neuropathy, with long-term current use of insulin (HCC); COPD with acute exacerbation (HCC); GAD (generalized anxiety disorder); Allergic rhinitis; Diabetic peripheral neuropathy associated with type 2 diabetes mellitus (HCC); Swelling of right upper extremity; Carpal tunnel syndrome on both sides; Very limited skin mobility; Chronic left hip pain; Dependent for medication administration; Chills; Upper GI bleed; CAD (coronary artery disease); Epigastric abdominal tenderness; Chronic ethmoidal sinusitis; ABLA (acute blood loss anemia); Recurrent acute suppurative otitis media of right ear without spontaneous rupture of tympanic membrane; Viral URI with cough; Concha bullosa; Tinnitus of both ears; and PND (post-nasal drip) on their problem list..   She  has a past medical history of Abdominal pain (01/26/2012), Acute deep vein thrombosis (DVT) of femoral vein of right lower  extremity (HCC) (03/26/2022), Acute kidney injury superimposed on chronic kidney disease (HCC) (12/18/2014), Acute upper GI bleed  (03/26/2022), Allergy, Anemia, unspecified, Anxiety, Anxiety associated with depression (06/08/2017), Arthritis, Breast cancer (HCC), Breast cancer screening (02/19/2014), Breast mass (10/27/2014), Chronic diastolic CHF (congestive heart failure) (HCC), Chronic pain syndrome (02/06/2015), CKD (chronic kidney disease), stage III (HCC), Coronary artery disease, Debility (08/01/2012), Dementia (HCC), Depression, Diabetes mellitus, DOE (dyspnea on exertion) (06/24/2014), Dysphagia, unspecified(787.20) (07/31/2012), Fungal dermatitis (11/13/2007), GERD (gastroesophageal reflux disease), Gout, unspecified, Headache, History of anemia due to chronic kidney disease (03/26/2022), History of blood clots, History of deep venous thrombosis (01/27/2012), History of pulmonary embolus (PE) (04/22/2014), Hyperkalemia (07/26/2017), Hyperlipidemia, Hypertension, Insomnia, Joint pain, Joint swelling, Morbid obesity (HCC), Multiple benign nevi (11/15/2016), Myocardial infarction (HCC) (2004), Non-STEMI (non-ST elevated myocardial infarction) (HCC) (02/15/2020), Numbness, Obstructive sleep apnea, Osteoarthritis, Osteoarthritis, Osteoarthrosis, unspecified whether generalized or localized, lower leg, Pain in the chest (12/31/2013), Pain, chronic, Personal history of other diseases of the digestive system (12/03/2006), Polymyalgia rheumatica (HCC), Presence of stent in right coronary artery, Pulmonary emboli (HCC) (01/2012), Situational depression (06/04/2009), Syncope, vasovagal (07/04/2021), Type 2 diabetes mellitus with hyperlipidemia (HCC) (02/16/2022), Urinary incontinence, Vaginitis and vulvovaginitis (06/23/2016), Vertigo, and Wears dentures..   Chief Complaint: Follow-up on gabapentin, ear infection, and chronic sinusitis. Patient also reports throat irritation and leg swelling.  History of Present Illness:  Gabapentin Use. Patient is concerned about prior use of gabapentin, which was for the last diabetic.  Neuropathic  pain. Reports numbness in fingers and swelling in legs on the right side. Patient seems to have been advised by her home health nurse to discuss gabapentin use. Patient denies the use of oxycodone due to side effects including stomach sickness and constipation. Alternative discussed: amitriptyline.  Ear Infection. Patient was treated for a right ear infection with Augmentin. Reports improvement, though still experiences intermittent pain in the ear. Chronic sinusitis in the ethmoid sinus was also previously diagnosed, for which the patient was treated by an ENT specialist.  Throat Irritation. Patient has no cough but experiences a tickling sensation causing intermittent coughing, especially on the right side of the throat. Possibley postnasal drip following a recent sinus infection.  COPD Exacerbation. Previous COPD exacerbation improved after as needed nebulizing treatments. Current respiratory symptoms have subsided, with no new episodes of shortness of breath or wheezing.  Leg Swelling. Bilateral lower extremity edema noted. Patient has a history of congestive heart failure and is on torsamide 20mg  daily but reports recent weight gain and persistent swelling.  Past Surgical History:  Procedure Laterality Date   ABDOMINAL HYSTERECTOMY     partial   APPENDECTOMY     blood clots/legs and lungs  2013   BREAST BIOPSY Left 07/22/2014   BREAST BIOPSY Left 02/10/2013   BREAST LUMPECTOMY Left 11/05/2014   BREAST LUMPECTOMY WITH RADIOACTIVE SEED LOCALIZATION Left 11/05/2014   Procedure: LEFT BREAST LUMPECTOMY WITH RADIOACTIVE SEED LOCALIZATION;  Surgeon: Abigail Miyamoto, MD;  Location: [facility] OR;  Service: General;  Laterality: Left;   CARDIAC CATHETERIZATION     COLONOSCOPY     CORONARY ANGIOPLASTY  2   CORONARY STENT INTERVENTION N/A 02/17/2020   Procedure: CORONARY STENT INTERVENTION;  Surgeon: Marykay Lex, MD;  Location: [facility] INVASIVE CV LAB;  Service: Cardiovascular;  Laterality: N/A;    ESOPHAGOGASTRODUODENOSCOPY (EGD) WITH PROPOFOL N/A 11/07/2016   Procedure: ESOPHAGOGASTRODUODENOSCOPY (EGD) WITH PROPOFOL;  Surgeon: Iva Boop, MD;  Location: [facility] ENDOSCOPY;  Service: Endoscopy;  Laterality: N/A;   ESOPHAGOGASTRODUODENOSCOPY (EGD) WITH PROPOFOL N/A 01/05/2023  Procedure: ESOPHAGOGASTRODUODENOSCOPY (EGD) WITH PROPOFOL;  Surgeon: Hilarie Fredrickson, MD;  Location: [facility] ENDOSCOPY;  Service: Gastroenterology;  Laterality: N/A;   EXCISION OF SKIN TAG Right 11/05/2014   Procedure: EXCISION OF RIGHT EYELID SKIN TAG;  Surgeon: Abigail Miyamoto, MD;  Location: [facility] OR;  Service: General;  Laterality: Right;   EYE SURGERY Bilateral    cataract    GASTRIC BYPASS  1977    reversed in 1979, [facility]   LEFT HEART CATH AND CORONARY ANGIOGRAPHY N/A 02/17/2020   Procedure: LEFT HEART CATH AND CORONARY ANGIOGRAPHY;  Surgeon: Marykay Lex, MD;  Location: [facility] INVASIVE CV LAB;  Service: Cardiovascular;  Laterality: N/A;   LEFT HEART CATHETERIZATION WITH CORONARY ANGIOGRAM N/A 06/29/2014   Procedure: LEFT HEART CATHETERIZATION WITH CORONARY ANGIOGRAM;  Surgeon: Lennette Bihari, MD;  Location: [facility] CATH LAB;  Service: Cardiovascular;  Laterality: N/A;   MEMBRANE PEEL Right 10/23/2018   Procedure: MEMBRANE PEEL;  Surgeon: Edmon Crape, MD;  Location: [facility] OR;  Service: Ophthalmology;  Laterality: Right;   MI with stent placement  2004   PARS PLANA VITRECTOMY Right 10/23/2018   Procedure: PARS PLANA VITRECTOMY WITH 25 GAUGE;  Surgeon: Edmon Crape, MD;  Location: [facility] OR;  Service: Ophthalmology;  Laterality: Right;    Outpatient Medications Prior to Visit  Medication Sig Dispense Refill   albuterol (PROAIR HFA) 108 (90 Base) MCG/ACT inhaler Inhale 1-2 puffs into the lungs every 6 (six) hours as needed for wheezing or shortness of breath. 6.7 g 1   amoxicillin-clavulanate (AUGMENTIN) 875-125 MG tablet Take 1 tablet by mouth 2 (two) times daily. 20 tablet 0   apixaban (ELIQUIS) 5 MG TABS tablet Take 1  tablet (5 mg total) by mouth 2 (two) times daily. 180 tablet 3   atorvastatin (LIPITOR) 80 MG tablet TAKE 1 TABLET BY MOUTH EVERY DAY (Patient taking differently: Take 80 mg by mouth at bedtime.) 90 tablet 3   benzonatate (TESSALON) 100 MG capsule Take 1 capsule (100 mg total) by mouth 2 (two) times daily as needed for cough. 20 capsule 0   carvedilol (COREG) 12.5 MG tablet TAKE 1 TABLET BY MOUTH 2 TIMES DAILY (Patient taking differently: Take 12.5 mg by mouth 2 (two) times daily with a meal.) 180 tablet 1   cetirizine (ZYRTEC) 10 MG tablet Take 10 mg by mouth daily as needed for allergies or rhinitis.     Continuous Glucose Sensor (FREESTYLE LIBRE 14 DAY SENSOR) MISC PLACE 1 DEVICE ON THE SKIN AS DIRECTED EVERY 14 DAYS (Patient taking differently: Inject 1 Device into the skin every 14 (fourteen) days.) 6 each 2   diclofenac Sodium (VOLTAREN) 1 % GEL Apply 2 g topically 4 (four) times daily. (Patient taking differently: Apply 2 g topically 4 (four) times daily as needed (pain).) 150 g 2   docusate sodium (COLACE) 100 MG capsule Take 2 capsules (200 mg total) by mouth daily. 180 capsule 3   Elastic Bandages & Supports (B & B CARPAL TUNNEL BRACE) MISC Use brace on wrist as needed for wrist pain 2 each 10   febuxostat (ULORIC) 40 MG tablet Take 1 tablet (40 mg total) by mouth daily. 90 tablet 3   Fluticasone-Umeclidin-Vilant (TRELEGY ELLIPTA) 100-62.5-25 MCG/ACT AEPB Inhale 1 puff into the lungs daily. 1 each 11   folic acid (FOLVITE) 1 MG tablet TAKE 1 TABLET BY MOUTH EVERY DAY (Patient taking differently: Take 1 mg by mouth daily.) 90 tablet 1   glucagon 1 MG injection Inject 1 mg  into the vein once as needed for up to 1 dose. 1 each 12   glucose 4 GM chewable tablet Chew 1 tablet (4 g total) by mouth as needed for low blood sugar. 50 tablet 12   insulin glargine, 2 Unit Dial, (TOUJEO MAX SOLOSTAR) 300 UNIT/ML Solostar Pen Inject 40 Units into the skin daily. Patient assistance program provides.  Patient reports 40 units 04/24/2022 (Patient taking differently: Inject 34 Units into the skin daily before breakfast.) 12 mL 1   ipratropium-albuterol (DUONEB) 0.5-2.5 (3) MG/3ML SOLN Take 3 mLs by nebulization every 6 (six) hours as needed (cough, wheezing, or shortness of breath). 360 mL 0   linaclotide (LINZESS) 145 MCG CAPS capsule Patient take 1 capsule daily.  May add an additional capsule for breakthrough constipation associated with opioid use (Patient taking differently: Take 145 mcg by mouth every other day.) 180 capsule 1   meclizine (ANTIVERT) 25 MG tablet Take 1-2 tablets (25-50 mg total) by mouth 2 (two) times daily as needed for dizziness. 90 tablet 0   memantine (NAMENDA) 5 MG tablet TAKE 2 TABLETS BY MOUTH EVERY MORNING and TAKE 1 TABLET BY MOUTH EVERY EVENING (Patient taking differently: Take 5-10 mg by mouth See admin instructions. Take 10 mg by mouth in the morning and 5 mg in the evening) 270 tablet 5   montelukast (SINGULAIR) 10 MG tablet Take 1 tablet (10 mg total) by mouth at bedtime. 30 tablet 3   nitroGLYCERIN (NITROSTAT) 0.4 MG SL tablet Take one tablet under the tongue every 5 minutes as needed for chest pain (Patient taking differently: Place 0.4 mg under the tongue every 5 (five) minutes as needed for chest pain.) 50 tablet 0   NOVOLOG FLEXPEN 100 UNIT/ML FlexPen Inject 14 Units into the skin 2 (two) times daily with a meal. Max daily dose 70 units 60 mL 0   nystatin cream (MYCOSTATIN) Apply 1 Application topically 2 (two) times daily. 30 g 3   ondansetron (ZOFRAN) 4 MG tablet Take 1 tablet (4 mg total) by mouth every 8 (eight) hours as needed for nausea or vomiting. 20 tablet 0   oxyCODONE-acetaminophen (PERCOCET) 7.5-325 MG tablet Take 1 tablet by mouth 2 (two) times daily as needed (for pain).     pantoprazole (PROTONIX) 20 MG tablet TAKE 1 TABLET BY MOUTH EVERY DAY (Patient taking differently: Take 20 mg by mouth daily before breakfast.) 30 tablet 5   Semaglutide,0.25  or 0.5MG /DOS, (OZEMPIC, 0.25 OR 0.5 MG/DOSE,) 2 MG/3ML SOPN Inject 0.5 mg into the skin once a week. (Patient taking differently: Inject 0.5 mg into the skin every Monday.) 3 mL 11   sodium polystyrene (SPS) 15 GM/60ML suspension TAKE 60 MLS BY MOUTH ONCE WEEKLY TO KEEP POTASSIUM DOWN 240 mL 0   TYLENOL 500 MG tablet Take 1,000 mg by mouth every 6 (six) hours as needed for mild pain or headache.     TYLENOL PM EXTRA STRENGTH 500-25 MG TABS tablet Take 2 tablets by mouth at bedtime.     Vitamin D, Ergocalciferol, (DRISDOL) 1.25 MG (50000 UNIT) CAPS capsule TAKE 1 CAPSULE BY MOUTH ONCE WEEKLY (Patient taking differently: Take 50,000 Units by mouth every Monday.) 4 capsule 1   DULoxetine (CYMBALTA) 30 MG capsule Take 30 mg by mouth daily.     torsemide (DEMADEX) 20 MG tablet TAKE 1 TABLET BY MOUTH EVERY DAY TAKE AN EXTRA DOES FOR WEIGHT GAIN, 3LBs IN 1 DAY OR 5LBS IN 1 WEEK (Patient taking differently: Take 20 mg by mouth.  Take 20 mg by mouth in the morning and an additional 20 mg for a weight gain of 3 pounds in one day or 5 pounds in one week) 30 tablet 0   traZODone (DESYREL) 50 MG tablet Take 50 mg by mouth at bedtime.     fluticasone (FLONASE) 50 MCG/ACT nasal spray Place 2 sprays into both nostrils daily for 14 days. (Patient taking differently: Place 2 sprays into both nostrils every other day.) 1 g 0   No facility-administered medications prior to visit.    Family History  Problem Relation Age of Onset   Breast cancer Mother 27   Heart disease Mother    Throat cancer Father    Hypertension Father    Arthritis Father    Diabetes Father    Arthritis Sister    Obesity Sister    Diabetes Sister    Heart disease Cousin    Colon cancer Neg Hx    Stomach cancer Neg Hx    Esophageal cancer Neg Hx     Social History   Socioeconomic History   Marital status: Widowed    Spouse name: Not on file   Number of children: 3   Years of education: Not on file   Highest education level: High  school graduate  Occupational History   Occupation: retired  Tobacco Use   Smoking status: Never    Passive exposure: Past   Smokeless tobacco: Never   Tobacco comments:    Both parents smoked, patient was exposed/ "Raised up in smoke, my whole life."  Vaping Use   Vaping status: Never Used  Substance and Sexual Activity   Alcohol use: No    Alcohol/week: 0.0 standard drinks of alcohol   Drug use: No   Sexual activity: Not on file  Other Topics Concern   Not on file  Social History Narrative   Not on file   Social Determinants of Health   Financial Resource Strain: Low Risk  (06/05/2022)   Overall Financial Resource Strain (CARDIA)    Difficulty of Paying Living Expenses: Not hard at all  Food Insecurity: No Food Insecurity (01/09/2023)   Hunger Vital Sign    Worried About Running Out of Food in the Last Year: Never true    Ran Out of Food in the Last Year: Never true  Transportation Needs: No Transportation Needs (01/03/2023)   PRAPARE - Administrator, Civil Service (Medical): No    Lack of Transportation (Non-Medical): No  Physical Activity: Insufficiently Active (06/05/2022)   Exercise Vital Sign    Days of Exercise per Week: 3 days    Minutes of Exercise per Session: 10 min  Stress: Stress Concern Present (06/05/2022)   [employer] of Occupational Health - Occupational Stress Questionnaire    Feeling of Stress : Rather much  Social Connections: Moderately Isolated (04/20/2022)   Social Connection and Isolation Panel [NHANES]    Frequency of Communication with Friends and Family: Once a week    Frequency of Social Gatherings with Friends and Family: Once a week    Attends Religious Services: 1 to 4 times per year    Active Member of [employer] or Organizations: Yes    Attends Banker Meetings: 1 to 4 times per year    Marital Status: Widowed  Intimate Partner Violence: Not At Risk (01/03/2023)   Humiliation, Afraid, Rape, and Kick questionnaire     Fear of Current or Ex-Partner: No    Emotionally Abused: No  Physically Abused: No    Sexually Abused: No                                                                                                  Objective:  Physical Exam: BP 130/78 (BP Location: Right Wrist, Patient Position: Sitting, Cuff Size: Large)   Pulse 70   Temp 97.7 F (36.5 C) (Temporal)   Wt 277 lb 12.8 oz (126 kg)   SpO2 95%   BMI 43.51 kg/m     Physical Exam Constitutional:      Appearance: She is obese. She is not ill-appearing.  HENT:     Head: Normocephalic and atraumatic.     Right Ear: Hearing, tympanic membrane and ear canal normal. No middle ear effusion.     Left Ear: Hearing, tympanic membrane and ear canal normal.     Nose: Nose normal.  Eyes:     General: No scleral icterus.       Right eye: No discharge.        Left eye: No discharge.     Extraocular Movements: Extraocular movements intact.  Cardiovascular:     Rate and Rhythm: Normal rate and regular rhythm.     Heart sounds: Normal heart sounds.     Comments: No cyanosis, no JVD Pulmonary:     Effort: Pulmonary effort is normal.     Breath sounds: Normal breath sounds. No decreased air movement.     Comments: No auditory wheezing Abdominal:     General: Abdomen is flat. There is no distension.     Palpations: Abdomen is soft.     Tenderness: There is no abdominal tenderness.  Musculoskeletal:     Right lower leg: Edema present.     Left lower leg: Edema present.     Comments: Normal Ambulation. No clubbing  Skin:    General: Skin is warm.     Findings: No rash.  Neurological:     General: No focal deficit present.     Mental Status: She is alert.     Cranial Nerves: No cranial nerve deficit.     Motor: No weakness (Wheelchair-bound).  Psychiatric:        Attention and Perception: She is attentive.        Judgment: Judgment is not impulsive.     CT ABDOMEN PELVIS WO CONTRAST  Result Date: 01/03/2023 CLINICAL  DATA:  Epigastric pain. EXAM: CT ABDOMEN AND PELVIS WITHOUT CONTRAST TECHNIQUE: Multidetector CT imaging of the abdomen and pelvis was performed following the standard protocol without IV contrast. RADIATION DOSE REDUCTION: This exam was performed according to the departmental dose-optimization program which includes automated exposure control, adjustment of the mA and/or kV according to patient size and/or use of iterative reconstruction technique. COMPARISON:  CT abdomen pelvis dated 03/26/2022. FINDINGS: Evaluation of this exam is limited in the absence of intravenous contrast. Lower chest: The visualized lung bases are clear. There is advanced 3 vessel coronary vascular calcification. No intra-abdominal free air or free fluid. Hepatobiliary: The liver is unremarkable. No biliary dilatation. Multiple gallstones. No pericholecystic fluid or evidence of  acute cholecystitis by CT. Pancreas: Unremarkable. No pancreatic ductal dilatation or surrounding inflammatory changes. Spleen: Normal in size without focal abnormality. Adrenals/Urinary Tract: The adrenal glands are unremarkable. There is no hydronephrosis or nephrolithiasis on either side. Bilateral renal cysts. There is no hydronephrosis or nephrolithiasis on either side. The visualized ureters and urinary bladder appear unremarkable. Stomach/Bowel: There is postsurgical changes of bowel with multiple anastomotic staple lines. There is no bowel obstruction or active inflammation. Appendectomy. Vascular/Lymphatic: Mild aortoiliac atherosclerotic disease. The IVC is unremarkable. No portal venous gas. There is no adenopathy. Reproductive: The uterus is anteverted and grossly unremarkable. No adnexal masses. Other: Anterior abdominal wall hernia repair.  No recurrent hernia. Musculoskeletal: Osteopenia with degenerative changes. No acute osseous pathology. IMPRESSION: 1. No acute intra-abdominal or pelvic pathology. 2. Cholelithiasis. 3.  Aortic Atherosclerosis  (ICD10-I70.0). Electronically Signed   By: Elgie Collard M.D.   On: 01/03/2023 21:32   CT Chest Wo Contrast  Result Date: 12/29/2022 CLINICAL DATA:  Chest pain, abnormal chest x-ray EXAM: CT CHEST WITHOUT CONTRAST TECHNIQUE: Multidetector CT imaging of the chest was performed following the standard protocol without IV contrast. RADIATION DOSE REDUCTION: This exam was performed according to the departmental dose-optimization program which includes automated exposure control, adjustment of the mA and/or kV according to patient size and/or use of iterative reconstruction technique. COMPARISON:  Chest x-ray dated December 29, 2022; CT chest, abdomen and pelvis dated February 08, 2022; chest CT dated July 04, 2021 FINDINGS: Cardiovascular: Normal heart size. No pericardial effusion. Normal caliber thoracic aorta with moderate atherosclerotic disease. Severe coronary artery calcifications. Mediastinum/Nodes: Small hiatal hernia. Thyroid is unremarkable. No enlarged lymph nodes seen in the chest. Lungs/Pleura: Central airways are patent. No consolidation, pleural effusion or pneumothorax. No reticular opacities, traction bronchiectasis or honeycomb change. Small solid nodule of the left upper lobe measuring 4 mm on series 4, image 138, no follow-up imaging is necessary given greater than 1 year stability. Upper Abdomen: Gallstones. Low-attenuation right renal lesions which are likely simple cysts. Musculoskeletal: No chest wall mass or suspicious bone lesions identified. IMPRESSION: 1. No acute findings in the chest or evidence interstitial lung disease. 2. Coronary artery calcifications aortic Atherosclerosis (ICD10-I70.0). Electronically Signed   By: Allegra Lai M.D.   On: 12/29/2022 17:03   DG Chest Port 1 View  Result Date: 12/29/2022 CLINICAL DATA:  Chest pain. EXAM: PORTABLE CHEST 1 VIEW COMPARISON:  Chest x-ray dated July 12, 2022. FINDINGS: The heart size and mediastinal contours are within normal  limits. Mildly increased ill-defined interstitial opacities, worst at the lung bases. No focal consolidation, pleural effusion, or pneumothorax. No acute osseous abnormality. IMPRESSION: 1. Increased ill-defined interstitial opacities, worst at the lung bases, concerning for atypical infection or mild interstitial pulmonary edema. Electronically Signed   By: Obie Dredge M.D.   On: 12/29/2022 13:44    Recent Results (from the past 2160 hour(s))  SARS Coronavirus 2 by RT PCR (hospital order, performed in [facility] hospital lab) *cepheid single result test* Anterior Nasal Swab     Status: None   Collection Time: 12/29/22 12:37 PM   Specimen: Anterior Nasal Swab  Result Value Ref Range   SARS Coronavirus 2 by RT PCR NEGATIVE NEGATIVE    Comment: Performed at [facility], [address], [place], [place] [address]  CBC     Status: Abnormal   Collection Time: 12/29/22 12:39 PM  Result Value Ref Range   WBC 12.4 (H) 4.0 - 10.5 K/uL   RBC 2.75 (  L) 3.87 - 5.11 MIL/uL   Hemoglobin 7.6 (L) 12.0 - 15.0 g/dL   HCT 62.3 (L) 76.2 - 83.1 %   MCV 92.0 80.0 - 100.0 fL   MCH 27.6 26.0 - 34.0 pg   MCHC 30.0 30.0 - 36.0 g/dL   RDW 51.7 61.6 - 07.3 %   Platelets 246 150 - 400 K/uL   nRBC 0.0 0.0 - 0.2 %    Comment: Performed at [facility], [address] [place], [place] [address]  Troponin I (High Sensitivity)     Status: None   Collection Time: 12/29/22 12:39 PM  Result Value Ref Range   Troponin I (High Sensitivity) 13 <18 ng/L    Comment: (NOTE) Elevated high sensitivity troponin I (hsTnI) values and significant  changes across serial measurements may suggest ACS but many other  chronic and acute conditions are known to elevate hsTnI results.  Refer to the "Links" section for chest pain algorithms and additional  guidance. Performed at [facility], [address], [place], [place] [address]   Comprehensive metabolic panel     Status: Abnormal   Collection Time:  12/29/22 12:39 PM  Result Value Ref Range   Sodium 137 135 - 145 mmol/L   Potassium 4.2 3.5 - 5.1 mmol/L   Chloride 100 98 - 111 mmol/L   CO2 21 (L) 22 - 32 mmol/L   Glucose, Bld 218 (H) 70 - 99 mg/dL    Comment: Glucose reference range applies only to samples taken after fasting for at least 8 hours.   BUN 100 (H) 8 - 23 mg/dL   Creatinine, Ser 4.62 (H) 0.44 - 1.00 mg/dL   Calcium 8.5 (L) 8.9 - 10.3 mg/dL   Total Protein 6.9 6.5 - 8.1 g/dL   Albumin 2.6 (L) 3.5 - 5.0 g/dL   AST 16 15 - 41 U/L   ALT 12 0 - 44 U/L   Alkaline Phosphatase 82 38 - 126 U/L   Total Bilirubin 0.4 0.3 - 1.2 mg/dL   GFR, Estimated 16 (L) >60 mL/min    Comment: (NOTE) Calculated using the CKD-EPI Creatinine Equation (2021)    Anion gap 16 (H) 5 - 15    Comment: Performed at [facility], [address] [place], [place] [address]  D-dimer, quantitative     Status: Abnormal   Collection Time: 12/29/22 12:39 PM  Result Value Ref Range   D-Dimer, Quant 0.55 (H) 0.00 - 0.50 ug/mL-FEU    Comment: (NOTE) At the manufacturer cut-off value of 0.5 g/mL FEU, this assay has a negative predictive value of 95-100%.This assay is intended for use in conjunction with a clinical pretest probability (PTP) assessment model to exclude pulmonary embolism (PE) and deep venous thrombosis (DVT) in outpatients suspected of PE or DVT. Results should be correlated with clinical presentation. Performed at [facility], [address], [place], [place] [address]   POC occult blood, ED Provider will collect     Status: None   Collection Time: 12/29/22  1:26 PM  Result Value Ref Range   Fecal Occult Bld NEGATIVE NEGATIVE  Urinalysis, Routine w reflex microscopic -Urine, Clean Catch     Status: Abnormal   Collection Time: 12/29/22  2:42 PM  Result Value Ref Range   Color, Urine YELLOW YELLOW   APPearance HAZY (A) CLEAR   Specific Gravity, Urine 1.009 1.005 - 1.030   pH 7.0 5.0 - 8.0   Glucose, UA NEGATIVE NEGATIVE  mg/dL   Hgb urine dipstick NEGATIVE NEGATIVE   Bilirubin Urine NEGATIVE NEGATIVE   Ketones, ur NEGATIVE NEGATIVE mg/dL   Protein, ur NEGATIVE NEGATIVE mg/dL   Nitrite NEGATIVE NEGATIVE   Leukocytes,Ua LARGE (A) NEGATIVE   RBC / HPF 0-5 0 - 5 RBC/hpf   WBC, UA >50 0 - 5 WBC/hpf   Bacteria, UA FEW (A) NONE SEEN   Squamous Epithelial / HPF 0-5 0 - 5 /HPF   Mucus PRESENT     Comment: Performed at [facility], [address], [name], [place] [address]  Troponin I (High Sensitivity)     Status: None   Collection Time: 12/29/22  2:42 PM  Result Value Ref Range   Troponin I (High Sensitivity) 11 <18 ng/L    Comment: (NOTE) Elevated high sensitivity troponin I (hsTnI) values and significant  changes across serial measurements may suggest ACS but many other  chronic and acute conditions are known to elevate hsTnI results.  Refer to the "Links" section for chest pain algorithms and additional  guidance. Performed at [facility], [address] [place], [place] [address]   Iron and Iron Binding Capacity ([facility] only)     Status: Abnormal   Collection Time: 01/03/23  2:15 PM  Result Value Ref Range   Iron 15 (L) 28 - 170 ug/dL   TIBC 098 (L) 119 - 147 ug/dL   Saturation Ratios 8 (L) 10.4 - 31.8 %   UIBC 178 ug/dL    Comment: Performed at [facility], [address] [place], [place] [address]  CMP (Cancer Center only)     Status: Abnormal   Collection Time: 01/03/23  2:15 PM  Result Value Ref Range   Sodium 138 135 - 145 mmol/L   Potassium 4.3 3.5 - 5.1 mmol/L   Chloride 105 98 - 111 mmol/L   CO2 29 22 - 32 mmol/L   Glucose, Bld 144 (H) 70 - 99 mg/dL    Comment: Glucose reference range applies only to samples taken after fasting for at least 8 hours.   BUN 77 (H) 8 - 23 mg/dL   Creatinine 2.13 (H) 0.86 - 1.00 mg/dL   Calcium 8.5 (L) 8.9 - 10.3 mg/dL   Total Protein 6.8 6.5 - 8.1 g/dL   Albumin 3.0 (L) 3.5 - 5.0 g/dL   AST 20 15 - 41 U/L    ALT 12 0 - 44 U/L   Alkaline Phosphatase 73 38 - 126 U/L   Total Bilirubin 0.3 0.3 - 1.2 mg/dL   GFR, Estimated 21 (L) >60 mL/min    Comment: (NOTE) Calculated using the CKD-EPI Creatinine Equation (2021)    Anion gap 4 (L) 5 - 15    Comment: Performed at [facility], [address] [place], [place] [address]  CBC with Differential (Cancer Center Only)     Status: Abnormal   Collection Time: 01/03/23  2:15 PM  Result Value Ref Range   WBC Count 6.4 4.0 - 10.5 K/uL   RBC 2.53 (L) 3.87 - 5.11 MIL/uL   Hemoglobin 7.1 (L) 12.0 - 15.0 g/dL   HCT 96.2 (L) 95.2 - 84.1 %   MCV 91.3 80.0 - 100.0 fL   MCH 28.1 26.0 - 34.0 pg   MCHC 30.7 30.0 - 36.0 g/dL   RDW 32.4 40.1 - 02.7 %   Platelet Count 216 150 - 400 K/uL   nRBC 0.0 0.0 - 0.2 %   Neutrophils  Relative % 67 %   Neutro Abs 4.2 1.7 - 7.7 K/uL   Lymphocytes Relative 18 %   Lymphs Abs 1.2 0.7 - 4.0 K/uL   Monocytes Relative 11 %   Monocytes Absolute 0.7 0.1 - 1.0 K/uL   Eosinophils Relative 4 %   Eosinophils Absolute 0.3 0.0 - 0.5 K/uL   Basophils Relative 0 %   Basophils Absolute 0.0 0.0 - 0.1 K/uL   Immature Granulocytes 0 %   Abs Immature Granulocytes 0.02 0.00 - 0.07 K/uL    Comment: Performed at [facility], [address], [place], [place] [address]  Ferritin     Status: None   Collection Time: 01/03/23  2:16 PM  Result Value Ref Range   Ferritin 77 11 - 307 ng/mL    Comment: Performed at [facility], [address], [place], [place] [address]  CBC with Differential     Status: Abnormal   Collection Time: 01/03/23  4:35 PM  Result Value Ref Range   WBC 7.6 4.0 - 10.5 K/uL   RBC 2.52 (L) 3.87 - 5.11 MIL/uL   Hemoglobin 7.0 (L) 12.0 - 15.0 g/dL   HCT 43.3 (L) 29.5 - 18.8 %   MCV 92.9 80.0 - 100.0 fL   MCH 27.8 26.0 - 34.0 pg   MCHC 29.9 (L) 30.0 - 36.0 g/dL   RDW 41.6 60.6 - 30.1 %   Platelets 213 150 - 400 K/uL   nRBC 0.0 0.0 - 0.2 %    Neutrophils Relative % 66 %   Neutro Abs 4.9 1.7 - 7.7 K/uL   Lymphocytes Relative 19 %   Lymphs Abs 1.4 0.7 - 4.0 K/uL   Monocytes Relative 11 %   Monocytes Absolute 0.9 0.1 - 1.0 K/uL   Eosinophils Relative 4 %   Eosinophils Absolute 0.3 0.0 - 0.5 K/uL   Basophils Relative 0 %   Basophils Absolute 0.0 0.0 - 0.1 K/uL   Immature Granulocytes 0 %   Abs Immature Granulocytes 0.02 0.00 - 0.07 K/uL    Comment: Performed at [facility], [address], [place], [place] [address]  Comprehensive metabolic panel     Status: Abnormal   Collection Time: 01/03/23  4:35 PM  Result Value Ref Range   Sodium 137 135 - 145 mmol/L   Potassium 4.6 3.5 - 5.1 mmol/L   Chloride 103 98 - 111 mmol/L   CO2 25 22 - 32 mmol/L   Glucose, Bld 142 (H) 70 - 99 mg/dL    Comment: Glucose reference range applies only to samples taken after fasting for at least 8 hours.   BUN 75 (H) 8 - 23 mg/dL   Creatinine, Ser 3.23 (H) 0.44 - 1.00 mg/dL   Calcium 8.7 (L) 8.9 - 10.3 mg/dL   Total Protein 6.7 6.5 - 8.1 g/dL   Albumin 2.7 (L) 3.5 - 5.0 g/dL   AST 22 15 - 41 U/L   ALT 16 0 - 44 U/L   Alkaline Phosphatase 69 38 - 126 U/L   Total Bilirubin 0.4 0.3 - 1.2 mg/dL   GFR, Estimated 20 (L) >60 mL/min    Comment: (NOTE) Calculated using the CKD-EPI Creatinine Equation (2021)    Anion gap 9 5 - 15    Comment: Performed at [facility], [address], [place], [place] [address]  Hemoglobin A1c     Status: Abnormal   Collection Time: 01/03/23  4:35 PM  Result Value Ref  Range   Hgb A1c MFr Bld 7.2 (H) 4.8 - 5.6 %    Comment: (NOTE) Pre diabetes:          5.7%-6.4%  Diabetes:              >6.4%  Glycemic control for   <7.0% adults with diabetes    Mean Plasma Glucose 159.94 mg/dL    Comment: Performed at [facility], [address], [place], [place] [address]  Reticulocytes     Status: Abnormal   Collection Time: 01/03/23  4:35 PM  Result Value Ref Range   Retic Ct  Pct 2.0 0.4 - 3.1 %   RBC. 2.52 (L) 3.87 - 5.11 MIL/uL   Retic Count, Absolute 51.4 19.0 - 186.0 K/uL   Immature Retic Fract 20.6 (H) 2.3 - 15.9 %    Comment: Performed at [facility], [address] [place], [place] [address]  TSH     Status: None   Collection Time: 01/03/23  4:35 PM  Result Value Ref Range   TSH 0.804 0.350 - 4.500 uIU/mL    Comment: Performed by a 3rd Generation assay with a functional sensitivity of <=0.01 uIU/mL. Performed at [facility], [address], [place], [place] [address]   Phosphorus     Status: None   Collection Time: 01/03/23  4:35 PM  Result Value Ref Range   Phosphorus 3.2 2.5 - 4.6 mg/dL    Comment: Performed at [facility], [address] [place], [place] [address]  Magnesium     Status: Abnormal   Collection Time: 01/03/23  4:35 PM  Result Value Ref Range   Magnesium 1.5 (L) 1.7 - 2.4 mg/dL    Comment: Performed at [facility], [address], [place], [place] [address]  CK     Status: None   Collection Time: 01/03/23  4:35 PM  Result Value Ref Range   Total CK 89 38 - 234 U/L    Comment: Performed at [facility], [address] [place], [place] [address]  Type and screen [facility]     Status: None   Collection Time: 01/03/23  6:00 PM  Result Value Ref Range   ABO/RH(D) A POS    Antibody Screen NEG    Sample Expiration 01/06/2023,2359    Unit Number [id number]    Blood Component Type RED CELLS,LR    Unit division 00    Status of Unit ISSUED,FINAL    Transfusion Status OK TO TRANSFUSE    Crossmatch Result Compatible    Unit Number [id number]    Blood Component Type RED CELLS,LR    Unit division 00    Status of Unit ISSUED,FINAL    Transfusion Status OK TO TRANSFUSE    Crossmatch Result      Compatible Performed at [facility], [address], [place], [place] [address]   Prepare RBC  (crossmatch)     Status: None   Collection Time: 01/03/23  6:00 PM  Result Value Ref Range   Order Confirmation      ORDER PROCESSED BY BLOOD BANK Performed at [facility], [address], [name], [place] [address]   BPAM RBC     Status: None   Collection Time: 01/03/23  6:00 PM  Result Value Ref Range   ISSUE DATE / TIME 884166063016    Blood Product Unit Number [id number]    PRODUCT CODE [id number]  Unit Type and Rh 6200    Blood Product Expiration Date 130865784696    ISSUE DATE / TIME [id number]    Blood Product Unit Number [id number]    PRODUCT CODE [id number]    Unit Type and Rh 6200    Blood Product Expiration Date 387564332951   POC occult blood, ED Provider will collect     Status: Abnormal   Collection Time: 01/03/23  6:11 PM  Result Value Ref Range   Fecal Occult Bld POSITIVE (A) NEGATIVE  CBG monitoring, ED     Status: Abnormal   Collection Time: 01/03/23  8:03 PM  Result Value Ref Range   Glucose-Capillary 145 (H) 70 - 99 mg/dL    Comment: Glucose reference range applies only to samples taken after fasting for at least 8 hours.  Glucose, capillary     Status: Abnormal   Collection Time: 01/03/23 10:28 PM  Result Value Ref Range   Glucose-Capillary 117 (H) 70 - 99 mg/dL    Comment: Glucose reference range applies only to samples taken after fasting for at least 8 hours.  Glucose, capillary     Status: Abnormal   Collection Time: 01/04/23  3:55 AM  Result Value Ref Range   Glucose-Capillary 118 (H) 70 - 99 mg/dL    Comment: Glucose reference range applies only to samples taken after fasting for at least 8 hours.  Vitamin B12     Status: None   Collection Time: 01/04/23  4:25 AM  Result Value Ref Range   Vitamin B-12 367 180 - 914 pg/mL    Comment: (NOTE) This assay is not validated for testing neonatal or myeloproliferative syndrome specimens for Vitamin B12 levels. Performed at [facility], [address], [place], [place] [address]   Folate     Status: None   Collection Time: 01/04/23  4:25 AM  Result Value Ref Range   Folate 38.5 >5.9 ng/mL    Comment: Performed at [facility], [address], [place], [place] [address]  Prealbumin     Status: Abnormal   Collection Time: 01/04/23  4:25 AM  Result Value Ref Range   Prealbumin 10 (L) 18 - 38 mg/dL    Comment: Performed at [facility], [address], [place], [place] [address]  Magnesium     Status: None   Collection Time: 01/04/23  4:25 AM  Result Value Ref Range   Magnesium 1.7 1.7 - 2.4 mg/dL    Comment: Performed at [facility], [address], [place], [place] [address]  Phosphorus     Status: None   Collection Time: 01/04/23  4:25 AM  Result Value Ref Range   Phosphorus 3.0 2.5 - 4.6 mg/dL    Comment: Performed at [facility], [address] [place], [place] [address]  Comprehensive metabolic panel     Status: Abnormal   Collection Time: 01/04/23  4:25 AM  Result Value Ref Range   Sodium 132 (L) 135 - 145 mmol/L   Potassium 4.0 3.5 - 5.1 mmol/L   Chloride 101 98 - 111 mmol/L   CO2 22 22 - 32 mmol/L   Glucose, Bld 117 (H) 70 - 99 mg/dL    Comment: Glucose reference range applies only to samples taken after fasting for at least 8 hours.   BUN 73 (H) 8 - 23 mg/dL   Creatinine, Ser 0.25 (H) 0.44 - 1.00 mg/dL   Calcium 7.8 (L) 8.9 - 10.3 mg/dL  Total Protein 6.3 (L) 6.5 - 8.1 g/dL   Albumin 2.4 (L) 3.5 - 5.0 g/dL   AST 20 15 - 41 U/L   ALT 15 0 - 44 U/L   Alkaline Phosphatase 66 38 - 126 U/L   Total Bilirubin 0.8 0.3 - 1.2 mg/dL   GFR, Estimated 22 (L) >60 mL/min    Comment: (NOTE) Calculated using the CKD-EPI Creatinine Equation (2021)    Anion gap 9 5 - 15    Comment: Performed at [facility], [address] [place], [place] [address]  CBC     Status: Abnormal   Collection Time: 01/04/23  4:25 AM  Result Value Ref Range   WBC  8.3 4.0 - 10.5 K/uL   RBC 2.71 (L) 3.87 - 5.11 MIL/uL   Hemoglobin 7.8 (L) 12.0 - 15.0 g/dL   HCT 60.4 (L) 54.0 - 98.1 %   MCV 91.5 80.0 - 100.0 fL   MCH 28.8 26.0 - 34.0 pg   MCHC 31.5 30.0 - 36.0 g/dL   RDW 19.1 47.8 - 29.5 %   Platelets 194 150 - 400 K/uL   nRBC 0.0 0.0 - 0.2 %    Comment: Performed at [facility], [address] [place], [place] [address]  Iron and TIBC     Status: Abnormal   Collection Time: 01/04/23  4:25 AM  Result Value Ref Range   Iron 62 28 - 170 ug/dL   TIBC 865 (L) 784 - 696 ug/dL   Saturation Ratios 38 (H) 10.4 - 31.8 %   UIBC 103 ug/dL    Comment: Performed at [facility], [address], [place], [place] [address]  Ferritin     Status: None   Collection Time: 01/04/23  4:25 AM  Result Value Ref Range   Ferritin 65 11 - 307 ng/mL    Comment: Performed at [facility], [address], [place], [place] [address]  Glucose, capillary     Status: None   Collection Time: 01/04/23  7:35 AM  Result Value Ref Range   Glucose-Capillary 82 70 - 99 mg/dL    Comment: Glucose reference range applies only to samples taken after fasting for at least 8 hours.  CBC     Status: Abnormal   Collection Time: 01/04/23 10:15 AM  Result Value Ref Range   WBC 8.3 4.0 - 10.5 K/uL   RBC 3.04 (L) 3.87 - 5.11 MIL/uL   Hemoglobin 8.6 (L) 12.0 - 15.0 g/dL   HCT 40.1 (L) 02.7 - 25.3 %   MCV 91.1 80.0 - 100.0 fL   MCH 28.3 26.0 - 34.0 pg   MCHC 31.0 30.0 - 36.0 g/dL   RDW 66.4 40.3 - 47.4 %   Platelets 195 150 - 400 K/uL   nRBC 0.0 0.0 - 0.2 %    Comment: Performed at [facility], [address] [place], [place] [address]  Glucose, capillary     Status: None   Collection Time: 01/04/23 11:40 AM  Result Value Ref Range   Glucose-Capillary 94 70 - 99 mg/dL    Comment: Glucose reference range applies only to samples taken after fasting for at least 8 hours.  CBC     Status: Abnormal    Collection Time: 01/04/23  2:16 PM  Result Value Ref Range   WBC 8.7 4.0 - 10.5 K/uL   RBC 3.15 (L) 3.87 - 5.11 MIL/uL   Hemoglobin 9.0 (L) 12.0 -  15.0 g/dL   HCT 16.1 (L) 09.6 - 04.5 %   MCV 94.0 80.0 - 100.0 fL   MCH 28.6 26.0 - 34.0 pg   MCHC 30.4 30.0 - 36.0 g/dL   RDW 40.9 81.1 - 91.4 %   Platelets 194 150 - 400 K/uL   nRBC 0.0 0.0 - 0.2 %    Comment: Performed at [facility], [address], [place], [place] [address]  Glucose, capillary     Status: Abnormal   Collection Time: 01/04/23  4:51 PM  Result Value Ref Range   Glucose-Capillary 172 (H) 70 - 99 mg/dL    Comment: Glucose reference range applies only to samples taken after fasting for at least 8 hours.  CBC     Status: Abnormal   Collection Time: 01/04/23  7:20 PM  Result Value Ref Range   WBC 7.1 4.0 - 10.5 K/uL   RBC 2.83 (L) 3.87 - 5.11 MIL/uL   Hemoglobin 8.0 (L) 12.0 - 15.0 g/dL   HCT 62.1 (L) 30.8 - 65.7 %   MCV 93.3 80.0 - 100.0 fL   MCH 28.3 26.0 - 34.0 pg   MCHC 30.3 30.0 - 36.0 g/dL   RDW 84.6 96.2 - 95.2 %   Platelets 182 150 - 400 K/uL   nRBC 0.0 0.0 - 0.2 %    Comment: Performed at [facility], [address] [place], [place] [address]  Glucose, capillary     Status: Abnormal   Collection Time: 01/04/23  8:39 PM  Result Value Ref Range   Glucose-Capillary 181 (H) 70 - 99 mg/dL    Comment: Glucose reference range applies only to samples taken after fasting for at least 8 hours.  Glucose, capillary     Status: None   Collection Time: 01/04/23 11:59 PM  Result Value Ref Range   Glucose-Capillary 93 70 - 99 mg/dL    Comment: Glucose reference range applies only to samples taken after fasting for at least 8 hours.   Comment 1 Notify RN   CBC with Differential/Platelet     Status: Abnormal   Collection Time: 01/05/23  1:00 AM  Result Value Ref Range   WBC 7.3 4.0 - 10.5 K/uL   RBC 2.77 (L) 3.87 - 5.11 MIL/uL   Hemoglobin 7.7 (L) 12.0 - 15.0 g/dL   HCT 44.0  (L) 10.2 - 46.0 %   MCV 92.1 80.0 - 100.0 fL   MCH 27.8 26.0 - 34.0 pg   MCHC 30.2 30.0 - 36.0 g/dL   RDW 72.5 36.6 - 44.0 %   Platelets 188 150 - 400 K/uL   nRBC 0.0 0.0 - 0.2 %   Neutrophils Relative % 60 %   Neutro Abs 4.4 1.7 - 7.7 K/uL   Lymphocytes Relative 26 %   Lymphs Abs 1.9 0.7 - 4.0 K/uL   Monocytes Relative 9 %   Monocytes Absolute 0.7 0.1 - 1.0 K/uL   Eosinophils Relative 5 %   Eosinophils Absolute 0.3 0.0 - 0.5 K/uL   Basophils Relative 0 %   Basophils Absolute 0.0 0.0 - 0.1 K/uL   Immature Granulocytes 0 %   Abs Immature Granulocytes 0.03 0.00 - 0.07 K/uL    Comment: Performed at [facility], [address], [place], [place] [address]  Basic metabolic panel     Status: Abnormal   Collection Time: 01/05/23  1:02 AM  Result Value Ref Range   Sodium 136 135 - 145 mmol/L  Potassium 4.7 3.5 - 5.1 mmol/L   Chloride 107 98 - 111 mmol/L   CO2 22 22 - 32 mmol/L   Glucose, Bld 83 70 - 99 mg/dL    Comment: Glucose reference range applies only to samples taken after fasting for at least 8 hours.   BUN 59 (H) 8 - 23 mg/dL   Creatinine, Ser 1.61 (H) 0.44 - 1.00 mg/dL   Calcium 8.0 (L) 8.9 - 10.3 mg/dL   GFR, Estimated 24 (L) >60 mL/min    Comment: (NOTE) Calculated using the CKD-EPI Creatinine Equation (2021)    Anion gap 7 5 - 15    Comment: Performed at [facility], [address], [place], [place] [address]  Glucose, capillary     Status: None   Collection Time: 01/05/23  4:21 AM  Result Value Ref Range   Glucose-Capillary 81 70 - 99 mg/dL    Comment: Glucose reference range applies only to samples taken after fasting for at least 8 hours.   Comment 1 Document in Chart   Glucose, capillary     Status: None   Collection Time: 01/05/23  7:30 AM  Result Value Ref Range   Glucose-Capillary 92 70 - 99 mg/dL    Comment: Glucose reference range applies only to samples taken after fasting for at least 8 hours.  Glucose, capillary      Status: None   Collection Time: 01/05/23 11:48 AM  Result Value Ref Range   Glucose-Capillary 99 70 - 99 mg/dL    Comment: Glucose reference range applies only to samples taken after fasting for at least 8 hours.  Prepare RBC (crossmatch)     Status: None   Collection Time: 01/05/23  3:02 PM  Result Value Ref Range   Order Confirmation      ORDER PROCESSED BY BLOOD BANK Performed at [facility], [address], [place], [place] [address]   Glucose, capillary     Status: Abnormal   Collection Time: 01/05/23  5:37 PM  Result Value Ref Range   Glucose-Capillary 125 (H) 70 - 99 mg/dL    Comment: Glucose reference range applies only to samples taken after fasting for at least 8 hours.  Glucose, capillary     Status: Abnormal   Collection Time: 01/05/23  8:20 PM  Result Value Ref Range   Glucose-Capillary 253 (H) 70 - 99 mg/dL    Comment: Glucose reference range applies only to samples taken after fasting for at least 8 hours.   Comment 1 Notify RN   Glucose, capillary     Status: Abnormal   Collection Time: 01/06/23 12:01 AM  Result Value Ref Range   Glucose-Capillary 195 (H) 70 - 99 mg/dL    Comment: Glucose reference range applies only to samples taken after fasting for at least 8 hours.   Comment 1 Notify RN   CBC with Differential/Platelet     Status: Abnormal   Collection Time: 01/06/23  4:36 AM  Result Value Ref Range   WBC 9.7 4.0 - 10.5 K/uL   RBC 2.97 (L) 3.87 - 5.11 MIL/uL   Hemoglobin 8.5 (L) 12.0 - 15.0 g/dL   HCT 11.9 (L) 14.7 - 82.9 %   MCV 92.6 80.0 - 100.0 fL   MCH 28.6 26.0 - 34.0 pg   MCHC 30.9 30.0 - 36.0 g/dL   RDW 56.2 13.0 - 86.5 %   Platelets 181 150 - 400 K/uL   nRBC 0.0 0.0 - 0.2 %  Neutrophils Relative % 60 %   Neutro Abs 5.8 1.7 - 7.7 K/uL   Lymphocytes Relative 28 %   Lymphs Abs 2.8 0.7 - 4.0 K/uL   Monocytes Relative 7 %   Monocytes Absolute 0.7 0.1 - 1.0 K/uL   Eosinophils Relative 4 %   Eosinophils Absolute 0.4 0.0 -  0.5 K/uL   Basophils Relative 0 %   Basophils Absolute 0.0 0.0 - 0.1 K/uL   Immature Granulocytes 1 %   Abs Immature Granulocytes 0.05 0.00 - 0.07 K/uL    Comment: Performed at [facility], [address], [place], [place] [address]  Basic metabolic panel     Status: Abnormal   Collection Time: 01/06/23  4:36 AM  Result Value Ref Range   Sodium 138 135 - 145 mmol/L   Potassium 4.7 3.5 - 5.1 mmol/L   Chloride 107 98 - 111 mmol/L   CO2 23 22 - 32 mmol/L   Glucose, Bld 79 70 - 99 mg/dL    Comment: Glucose reference range applies only to samples taken after fasting for at least 8 hours.   BUN 55 (H) 8 - 23 mg/dL   Creatinine, Ser 6.04 (H) 0.44 - 1.00 mg/dL   Calcium 8.2 (L) 8.9 - 10.3 mg/dL   GFR, Estimated 27 (L) >60 mL/min    Comment: (NOTE) Calculated using the CKD-EPI Creatinine Equation (2021)    Anion gap 8 5 - 15    Comment: Performed at [facility], [address], [place], [place] [address]  Glucose, capillary     Status: Abnormal   Collection Time: 01/06/23  5:44 AM  Result Value Ref Range   Glucose-Capillary 66 (L) 70 - 99 mg/dL    Comment: Glucose reference range applies only to samples taken after fasting for at least 8 hours.  Glucose, capillary     Status: Abnormal   Collection Time: 01/06/23  6:14 AM  Result Value Ref Range   Glucose-Capillary 103 (H) 70 - 99 mg/dL    Comment: Glucose reference range applies only to samples taken after fasting for at least 8 hours.  Glucose, capillary     Status: Abnormal   Collection Time: 01/06/23  7:52 AM  Result Value Ref Range   Glucose-Capillary 115 (H) 70 - 99 mg/dL    Comment: Glucose reference range applies only to samples taken after fasting for at least 8 hours.  Glucose, capillary     Status: Abnormal   Collection Time: 01/06/23 12:27 PM  Result Value Ref Range   Glucose-Capillary 226 (H) 70 - 99 mg/dL    Comment: Glucose reference range applies only to samples taken after  fasting for at least 8 hours.  Basic Metabolic Panel (BMET)     Status: Abnormal   Collection Time: 01/10/23  3:37 PM  Result Value Ref Range   Sodium 137 135 - 145 mEq/L   Potassium 5.1 3.5 - 5.1 mEq/L   Chloride 106 96 - 112 mEq/L   CO2 25 19 - 32 mEq/L   Glucose, Bld 170 (H) 70 - 99 mg/dL   BUN 49 (H) 6 - 23 mg/dL   Creatinine, Ser 1.19 (H) 0.40 - 1.20 mg/dL   GFR 14.78 (L) >29.56 mL/min    Comment: Calculated using the CKD-EPI Creatinine Equation (2021)   Calcium 8.1 (L) 8.4 - 10.5 mg/dL  CBC w/Diff     Status: Abnormal   Collection Time: 01/10/23  3:37 PM  Result Value Ref Range  WBC 13.4 (H) 4.0 - 10.5 K/uL   RBC 3.43 (L) 3.87 - 5.11 Mil/uL   Hemoglobin 9.8 (L) 12.0 - 15.0 g/dL   HCT 16.1 (L) 09.6 - 04.5 %   MCV 89.5 78.0 - 100.0 fl   MCHC 31.7 30.0 - 36.0 g/dL   RDW 40.9 81.1 - 91.4 %   Platelets 229.0 150.0 - 400.0 K/uL   Neutrophils Relative % 78.4 (H) 43.0 - 77.0 %   Lymphocytes Relative 14.0 12.0 - 46.0 %   Monocytes Relative 5.7 3.0 - 12.0 %   Eosinophils Relative 1.5 0.0 - 5.0 %   Basophils Relative 0.4 0.0 - 3.0 %   Neutro Abs 10.5 (H) 1.4 - 7.7 K/uL   Lymphs Abs 1.9 0.7 - 4.0 K/uL   Monocytes Absolute 0.8 0.1 - 1.0 K/uL   Eosinophils Absolute 0.2 0.0 - 0.7 K/uL   Basophils Absolute 0.1 0.0 - 0.1 K/uL        Garner Nash, MD, MS

## 2023-01-23 NOTE — Assessment & Plan Note (Signed)
Improved.  Continue to monitor symptoms

## 2023-01-23 NOTE — Telephone Encounter (Signed)
Form faxed successfully. Copy placed in scan folder

## 2023-01-23 NOTE — Assessment & Plan Note (Signed)
Initiate amitriptyline 50mg  at night for pain, mood, and sleep Prescribe gabapentin ointment 10% (topical) - apply to the feet every 6 hours as needed

## 2023-01-23 NOTE — Telephone Encounter (Signed)
   CLINICAL USE BELOW THIS LINE (use X to signify action taken)  _x__ Form received and placed in providers office for signature. ___ Form completed and faxed to LOA Dept.  ___ Form completed & LVM to notify patient ready for pick up.  ___ Charge sheet and copy of form in front office folder for office supervisor.

## 2023-01-23 NOTE — Patient Instructions (Signed)
Take amitriptyline as prescribed, 50 mg at night, for neuropathy, mood, and sleep. Allow a couple of weeks for full effect. Discontinue trazodone and duloxetine. Monitor for side effects of sedation or other side effects.  Use the azelastine nose spray alongside Flonase to help with the residual postnasal drip. Apply gabapentin ointment to the painful areas, particularly feet or knees, up to every 6 hours as needed, if insurance covers it. Increase torsamide 20 mg to twice a day for the next 5 days to manage leg swelling and monitor your weight daily and breathing. Report any side effects or lack of improvement and consider a virtual follow-up appointment for convenience.

## 2023-01-23 NOTE — Assessment & Plan Note (Signed)
Noted leg swelling, possible CHF-related  Plan:  Increase torsamide dosage to twice daily for 5 days and monitor weight and edema Follow-up as needed

## 2023-01-24 ENCOUNTER — Telehealth: Payer: Self-pay

## 2023-01-24 ENCOUNTER — Encounter (HOSPITAL_COMMUNITY)
Admission: RE | Admit: 2023-01-24 | Discharge: 2023-01-24 | Disposition: A | Discharge status: Home / Self Care | Payer: PPO | Source: Ambulatory Visit | Attending: Nephrology | Admitting: Nephrology

## 2023-01-24 DIAGNOSIS — E1140 Type 2 diabetes mellitus with diabetic neuropathy, unspecified: Secondary | ICD-10-CM

## 2023-01-24 DIAGNOSIS — N189 Chronic kidney disease, unspecified: Secondary | ICD-10-CM | POA: Insufficient documentation

## 2023-01-24 DIAGNOSIS — D631 Anemia in chronic kidney disease: Secondary | ICD-10-CM | POA: Diagnosis not present

## 2023-01-24 MED ORDER — GABAPENTIN 10 % EX CREA: 1.0000 | TOPICAL_CREAM | Freq: Three times a day (TID) | CUTANEOUS | 1 refills | Status: DC | PRN

## 2023-01-24 MED ORDER — SODIUM CHLORIDE 0.9 % IV SOLN
200.0000 mg | INTRAVENOUS | Status: DC
  Administered 2023-01-24: 200 mg via INTRAVENOUS
  Filled 2023-01-24: qty 200

## 2023-01-24 NOTE — Telephone Encounter (Signed)
Pts daughter is aware and verbalized understanding.

## 2023-01-24 NOTE — Telephone Encounter (Signed)
Received a fax from [facility] pharmacy regarding Gabapentin 10% cream. They sent a note stating "Please sent to compounding pharmacy?". Please advise.

## 2023-01-25 DIAGNOSIS — J441 Chronic obstructive pulmonary disease with (acute) exacerbation: Secondary | ICD-10-CM | POA: Diagnosis not present

## 2023-01-26 ENCOUNTER — Telehealth: Payer: Self-pay | Admitting: Family Medicine

## 2023-01-26 ENCOUNTER — Other Ambulatory Visit: Payer: Self-pay | Admitting: Family Medicine

## 2023-01-26 ENCOUNTER — Other Ambulatory Visit: Payer: Self-pay

## 2023-01-26 DIAGNOSIS — R519 Headache, unspecified: Secondary | ICD-10-CM

## 2023-01-26 DIAGNOSIS — I5032 Chronic diastolic (congestive) heart failure: Secondary | ICD-10-CM

## 2023-01-26 DIAGNOSIS — R42 Dizziness and giddiness: Secondary | ICD-10-CM

## 2023-01-26 DIAGNOSIS — F332 Major depressive disorder, recurrent severe without psychotic features: Secondary | ICD-10-CM

## 2023-01-26 DIAGNOSIS — J449 Chronic obstructive pulmonary disease, unspecified: Secondary | ICD-10-CM | POA: Diagnosis not present

## 2023-01-26 DIAGNOSIS — F419 Anxiety disorder, unspecified: Secondary | ICD-10-CM

## 2023-01-26 MED ORDER — TORSEMIDE 20 MG PO TABS: 20.0000 mg | ORAL_TABLET | Freq: Every day | ORAL | 0 refills | Status: DC

## 2023-01-26 MED ORDER — MECLIZINE HCL 25 MG PO TABS: 25.0000 mg | ORAL_TABLET | Freq: Two times a day (BID) | ORAL | 0 refills | Status: DC | PRN

## 2023-01-26 NOTE — Telephone Encounter (Signed)
Prescription Request  01/26/2023  LOV: 01/23/2023  What is the name of the medication or equipment? torsemide (DEMADEX) 20 MG tablet [[id number]]  DISCONTINUED  and meclizine  Have you contacted your pharmacy to request a refill? Yes   Which pharmacy would you like this sent to?  [id number] - [place], [place] - [address] [place] [address] Phone: [phone] Fax: [phone]      Patient notified that their request is being sent to the clinical staff for review and that they should receive a response within 2 business days.   Please advise at [facility] [phone]

## 2023-01-26 NOTE — Telephone Encounter (Signed)
Received fax from [facility] for   torsemide [facility]) 20 MG tablet  Rx sent over to pharmacy.

## 2023-01-26 NOTE — Telephone Encounter (Signed)
Chart supports rx. Last OV: 01/23/2023 Next OV: 02/22/2023

## 2023-01-31 ENCOUNTER — Encounter (HOSPITAL_COMMUNITY)
Admission: RE | Admit: 2023-01-31 | Discharge: 2023-01-31 | Disposition: A | Discharge status: Home / Self Care | Payer: PPO | Source: Ambulatory Visit | Attending: Nephrology | Admitting: Nephrology

## 2023-01-31 ENCOUNTER — Telehealth: Payer: Self-pay | Admitting: Hematology

## 2023-01-31 DIAGNOSIS — N189 Chronic kidney disease, unspecified: Secondary | ICD-10-CM | POA: Diagnosis not present

## 2023-01-31 MED ORDER — SODIUM CHLORIDE 0.9 % IV SOLN
200.0000 mg | INTRAVENOUS | Status: DC
  Administered 2023-01-31: 200 mg via INTRAVENOUS
  Filled 2023-01-31: qty 200

## 2023-01-31 NOTE — Telephone Encounter (Signed)
Left patient a message in regards to scheduled appointment times/dates; left call back number if needed for rescheduling

## 2023-02-01 ENCOUNTER — Telehealth: Payer: Self-pay | Admitting: Family Medicine

## 2023-02-01 NOTE — Telephone Encounter (Signed)
HH ORDERS   Caller Name: [facility] Agency Name: [facility] Callback Phone #: [phone]  Service Requested: OT  Frequency of Visits: [id number]

## 2023-02-01 NOTE — Telephone Encounter (Signed)
l °

## 2023-02-02 ENCOUNTER — Ambulatory Visit: Payer: PPO | Admitting: Cardiology

## 2023-02-02 NOTE — Telephone Encounter (Signed)
Left Stephanie a vm with annotation below.

## 2023-02-05 ENCOUNTER — Telehealth: Payer: Self-pay | Admitting: Family Medicine

## 2023-02-05 DIAGNOSIS — E1140 Type 2 diabetes mellitus with diabetic neuropathy, unspecified: Secondary | ICD-10-CM

## 2023-02-05 MED ORDER — GABAPENTIN 10 % EX CREA: 1.0000 | TOPICAL_CREAM | Freq: Three times a day (TID) | CUTANEOUS | 1 refills | Status: DC | PRN

## 2023-02-05 NOTE — Telephone Encounter (Signed)
Pt said she need a refill on Gabapentin 10 % CREA [[id number]] . Pt just had a refill on 9.18.2024 but patient said she will be out today because she use it on her hands and feet. Pt said also she need a bigger prescription

## 2023-02-06 NOTE — Telephone Encounter (Signed)
Left patient a detailed voice message advising her that rx refill was sent into pharmacy.

## 2023-02-06 NOTE — Telephone Encounter (Signed)
Pt called back, I read her the message.

## 2023-02-14 ENCOUNTER — Encounter: Payer: Self-pay | Admitting: Family Medicine

## 2023-02-21 ENCOUNTER — Other Ambulatory Visit: Payer: Self-pay | Admitting: Hematology

## 2023-02-21 ENCOUNTER — Other Ambulatory Visit: Payer: Self-pay | Admitting: Family Medicine

## 2023-02-21 DIAGNOSIS — I5032 Chronic diastolic (congestive) heart failure: Secondary | ICD-10-CM

## 2023-02-22 ENCOUNTER — Ambulatory Visit: Payer: PPO | Admitting: Family Medicine

## 2023-02-22 ENCOUNTER — Encounter: Payer: Self-pay | Admitting: Family Medicine

## 2023-02-22 VITALS — BP 128/78 | HR 70 | Temp 97.7°F | Wt 270.4 lb

## 2023-02-22 DIAGNOSIS — Z23 Encounter for immunization: Secondary | ICD-10-CM

## 2023-02-22 DIAGNOSIS — F411 Generalized anxiety disorder: Secondary | ICD-10-CM

## 2023-02-22 DIAGNOSIS — F419 Anxiety disorder, unspecified: Secondary | ICD-10-CM

## 2023-02-22 DIAGNOSIS — G4709 Other insomnia: Secondary | ICD-10-CM

## 2023-02-22 DIAGNOSIS — E1140 Type 2 diabetes mellitus with diabetic neuropathy, unspecified: Secondary | ICD-10-CM | POA: Diagnosis not present

## 2023-02-22 DIAGNOSIS — F332 Major depressive disorder, recurrent severe without psychotic features: Secondary | ICD-10-CM

## 2023-02-22 DIAGNOSIS — Z794 Long term (current) use of insulin: Secondary | ICD-10-CM | POA: Diagnosis not present

## 2023-02-22 DIAGNOSIS — R0982 Postnasal drip: Secondary | ICD-10-CM | POA: Diagnosis not present

## 2023-02-22 MED ORDER — AMITRIPTYLINE HCL 75 MG PO TABS: 75.0000 mg | ORAL_TABLET | Freq: Every day | ORAL | 0 refills | Status: DC

## 2023-02-22 MED ORDER — HYDROXYZINE HCL 25 MG PO TABS
25.0000 mg | ORAL_TABLET | Freq: Three times a day (TID) | ORAL | 0 refills | Status: DC | PRN
Start: 2023-02-22 — End: 2023-06-05

## 2023-02-22 MED ORDER — GABAPENTIN 10 % EX CREA: 1.0000 | TOPICAL_CREAM | Freq: Three times a day (TID) | CUTANEOUS | 1 refills | Status: AC | PRN

## 2023-02-22 NOTE — Progress Notes (Signed)
Assessment/Plan:   Problem List Items Addressed This Visit       Endocrine   Type 2 diabetes mellitus with diabetic neuropathy, with long-term current use of insulin (HCC)    Pain managed effectively with topical gabapentin ointment but limited by cost.   Plan: Refill gabapentin ointment, and advise usage as needed. Amitriptyline increased to 75 mg nightly.      Relevant Medications   amitriptyline (ELAVIL) 75 MG tablet   Gabapentin 10 % CREA     Other   Insomnia (Chronic)   Relevant Medications   amitriptyline (ELAVIL) 75 MG tablet   Severe episode of recurrent major depressive disorder, without psychotic features (HCC)   Relevant Medications   amitriptyline (ELAVIL) 75 MG tablet   hydrOXYzine (ATARAX) 25 MG tablet   GAD (generalized anxiety disorder)    Currently well-managed with no recent panic anxiety episodes. Plan: Continue monitoring, no immediate changes.      Relevant Medications   amitriptyline (ELAVIL) 75 MG tablet   hydrOXYzine (ATARAX) 25 MG tablet   PND (post-nasal drip)    Improved.  Continue azelastine and fluticasone.  Monitor symptom progression      Other Visit Diagnoses     Immunization due    -  Primary   Relevant Orders   Flu Vaccine Trivalent High Dose (Fluad) (Completed)   Anxiety       Relevant Medications   amitriptyline (ELAVIL) 75 MG tablet   hydrOXYzine (ATARAX) 25 MG tablet       Medications Discontinued During This Encounter  Medication Reason   amitriptyline (ELAVIL) 50 MG tablet    hydrOXYzine (ATARAX) 25 MG tablet Reorder   Gabapentin 10 % CREA Reorder    Return in about 3 months (around 05/25/2023) for neuropathy.    Subjective:   Encounter date: 02/22/2023  [name] is a 88 y.o. female who has Hyperlipidemia; Class 3 obesity; Symptomatic anemia; TENSION HEADACHE; Essential hypertension; History of DVT (deep vein thrombosis); Chronic diastolic CHF (congestive heart failure) (HCC); Long term current use of  anticoagulant therapy; Insomnia; Estrogen deficiency; COPD with chronic bronchitis (HCC); Drug-induced constipation; Vertigo; Gout; Acute kidney injury superimposed on chronic kidney disease (HCC); Vitamin D deficiency; Endometrial polyp; GERD with esophagitis; Dementia (HCC); Type 2 diabetes mellitus with hyperlipidemia (HCC); Moderate nonproliferative diabetic retinopathy of both eyes without macular edema associated with type 2 diabetes mellitus (HCC); Chronic radicular lumbar pain; Lumbar spondylosis; Spinal stenosis, lumbar region, with neurogenic claudication; Bilateral primary osteoarthritis of knee; Iron deficiency anemia; Chronic pain of right hand; Breast cancer (HCC); Severe episode of recurrent major depressive disorder, without psychotic features (HCC); Pneumobilia; Dermatosis papulosa nigra; CKD (chronic kidney disease), stage IV (HCC); Type 2 diabetes mellitus with stage 4 chronic kidney disease, with long-term current use of insulin (HCC); Type 2 diabetes mellitus with diabetic neuropathy, with long-term current use of insulin (HCC); COPD with acute exacerbation (HCC); GAD (generalized anxiety disorder); Allergic rhinitis; Diabetic peripheral neuropathy associated with type 2 diabetes mellitus (HCC); Swelling of right upper extremity; Carpal tunnel syndrome on both sides; Very limited skin mobility; Chronic left hip pain; Dependent for medication administration; Chills; Upper GI bleed; CAD (coronary artery disease); Epigastric abdominal tenderness; Chronic ethmoidal sinusitis; ABLA (acute blood loss anemia); Recurrent acute suppurative otitis media of right ear without spontaneous rupture of tympanic membrane; Viral URI with cough; Concha bullosa; Tinnitus of both ears; and PND (post-nasal drip) on their problem list..   She  has a past medical history of Abdominal pain (01/26/2012),  Acute deep vein thrombosis (DVT) of femoral vein of right lower extremity (HCC) (03/26/2022), Acute kidney injury  superimposed on chronic kidney disease (HCC) (12/18/2014), Acute upper GI bleed (03/26/2022), Allergy, Anemia, unspecified, Anxiety, Anxiety associated with depression (06/08/2017), Arthritis, Breast cancer (HCC), Breast cancer screening (02/19/2014), Breast mass (10/27/2014), Chronic diastolic CHF (congestive heart failure) (HCC), Chronic pain syndrome (02/06/2015), CKD (chronic kidney disease), stage III (HCC), Coronary artery disease, Debility (08/01/2012), Dementia (HCC), Depression, Diabetes mellitus, DOE (dyspnea on exertion) (06/24/2014), Dysphagia, unspecified(787.20) (07/31/2012), Fungal dermatitis (11/13/2007), GERD (gastroesophageal reflux disease), Gout, unspecified, Headache, History of anemia due to chronic kidney disease (03/26/2022), History of blood clots, History of deep venous thrombosis (01/27/2012), History of pulmonary embolus (PE) (04/22/2014), Hyperkalemia (07/26/2017), Hyperlipidemia, Hypertension, Insomnia, Joint pain, Joint swelling, Morbid obesity (HCC), Multiple benign nevi (11/15/2016), Myocardial infarction (HCC) (2004), Non-STEMI (non-ST elevated myocardial infarction) (HCC) (02/15/2020), Numbness, Obstructive sleep apnea, Osteoarthritis, Osteoarthritis, Osteoarthrosis, unspecified whether generalized or localized, lower leg, Pain in the chest (12/31/2013), Pain, chronic, Personal history of other diseases of the digestive system (12/03/2006), Polymyalgia rheumatica (HCC), Presence of stent in right coronary artery, Pulmonary emboli (HCC) (01/2012), Situational depression (06/04/2009), Syncope, vasovagal (07/04/2021), Type 2 diabetes mellitus with hyperlipidemia (HCC) (02/16/2022), Urinary incontinence, Vaginitis and vulvovaginitis (06/23/2016), Vertigo, and Wears dentures..   Chief Complaint: Follow-up visit for sore throat, drainage, and neuropathy management.  History of Present Illness:   Patient presents for a follow-up visit. Initially experiencing a sore throat,  significant drainage, and neuropathy. Recently started on Flonase and azelastine nasal sprays, with noted improvement although symptoms are not fully resolved. Patient describes persistent drainage down the back of the throat without pain and an occasional cough. Neuropathy previously managed with gabapentin ointment, which is effective but costly. Also started on amitriptyline initially for mood, sleep, and neuropathy pain, but reported inconsistent usage and discontinuation due to concern of potential urinary side effect.  However, patient with persistent urinary symptoms even after cessation of the medication.  Denies urinary symptoms today.     12/21/2022    1:04 PM 12/19/2022    2:51 PM 11/29/2022    2:21 PM 07/12/2022    2:08 PM 06/05/2022   11:32 AM  Depression screen PHQ 2/9  Decreased Interest 0 1 3 0 3  Down, Depressed, Hopeless 0 2 3 3 3   PHQ - 2 Score 0 3 6 3 6   Altered sleeping  3 3 3 3   Tired, decreased energy  1 3 3 3   Change in appetite  2 3 3  0  Feeling bad or failure about yourself   0 1 2 3   Trouble concentrating  1 0 0 3  Moving slowly or fidgety/restless  1 0 3 3  Suicidal thoughts  0 0 0 0  PHQ-9 Score  11 16 17 21   Difficult doing work/chores  Somewhat difficult  Not difficult at all Very difficult      12/19/2022    2:51 PM 07/12/2022    2:10 PM 12/05/2021    1:51 PM 11/10/2021   11:03 AM  GAD 7 : Generalized Anxiety Score  Nervous, Anxious, on Edge 2 3 3 3   Control/stop worrying 1 3 3 3   Worry too much - different things 2 3 3 3   Trouble relaxing 3 3 3 3   Restless 2 3 3 2   Easily annoyed or irritable 1 3 3 3   Afraid - awful might happen 0 3 3 2   Total GAD 7 Score 11 21 21 19   Anxiety Difficulty Somewhat difficult  Not difficult at all Extremely difficult Not difficult at all     Review of Systems  Psychiatric/Behavioral:  The patient has insomnia (Currently managed with Tylenol PM, reports unsatisfactory control).     Past Surgical History:  Procedure  Laterality Date   ABDOMINAL HYSTERECTOMY     partial   APPENDECTOMY     blood clots/legs and lungs  2013   BREAST BIOPSY Left 07/22/2014   BREAST BIOPSY Left 02/10/2013   BREAST LUMPECTOMY Left 11/05/2014   BREAST LUMPECTOMY WITH RADIOACTIVE SEED LOCALIZATION Left 11/05/2014   Procedure: LEFT BREAST LUMPECTOMY WITH RADIOACTIVE SEED LOCALIZATION;  Surgeon: Abigail Miyamoto, MD;  Location: [facility] OR;  Service: General;  Laterality: Left;   CARDIAC CATHETERIZATION     COLONOSCOPY     CORONARY ANGIOPLASTY  2   CORONARY STENT INTERVENTION N/A 02/17/2020   Procedure: CORONARY STENT INTERVENTION;  Surgeon: Marykay Lex, MD;  Location: [facility] INVASIVE CV LAB;  Service: Cardiovascular;  Laterality: N/A;   ESOPHAGOGASTRODUODENOSCOPY (EGD) WITH PROPOFOL N/A 11/07/2016   Procedure: ESOPHAGOGASTRODUODENOSCOPY (EGD) WITH PROPOFOL;  Surgeon: Iva Boop, MD;  Location: [facility] ENDOSCOPY;  Service: Endoscopy;  Laterality: N/A;   ESOPHAGOGASTRODUODENOSCOPY (EGD) WITH PROPOFOL N/A 01/05/2023   Procedure: ESOPHAGOGASTRODUODENOSCOPY (EGD) WITH PROPOFOL;  Surgeon: Hilarie Fredrickson, MD;  Location: [facility] ENDOSCOPY;  Service: Gastroenterology;  Laterality: N/A;   EXCISION OF SKIN TAG Right 11/05/2014   Procedure: EXCISION OF RIGHT EYELID SKIN TAG;  Surgeon: Abigail Miyamoto, MD;  Location: [facility] OR;  Service: General;  Laterality: Right;   EYE SURGERY Bilateral    cataract    GASTRIC BYPASS  1977    reversed in 1979, [facility]   LEFT HEART CATH AND CORONARY ANGIOGRAPHY N/A 02/17/2020   Procedure: LEFT HEART CATH AND CORONARY ANGIOGRAPHY;  Surgeon: Marykay Lex, MD;  Location: [facility] INVASIVE CV LAB;  Service: Cardiovascular;  Laterality: N/A;   LEFT HEART CATHETERIZATION WITH CORONARY ANGIOGRAM N/A 06/29/2014   Procedure: LEFT HEART CATHETERIZATION WITH CORONARY ANGIOGRAM;  Surgeon: Lennette Bihari, MD;  Location: [facility] CATH LAB;  Service: Cardiovascular;  Laterality: N/A;   MEMBRANE PEEL Right 10/23/2018   Procedure: MEMBRANE  PEEL;  Surgeon: Edmon Crape, MD;  Location: [facility] OR;  Service: Ophthalmology;  Laterality: Right;   MI with stent placement  2004   PARS PLANA VITRECTOMY Right 10/23/2018   Procedure: PARS PLANA VITRECTOMY WITH 25 GAUGE;  Surgeon: Edmon Crape, MD;  Location: [facility] OR;  Service: Ophthalmology;  Laterality: Right;    Outpatient Medications Prior to Visit  Medication Sig Dispense Refill   albuterol (PROAIR HFA) 108 (90 Base) MCG/ACT inhaler Inhale 1-2 puffs into the lungs every 6 (six) hours as needed for wheezing or shortness of breath. 6.7 g 1   amoxicillin-clavulanate (AUGMENTIN) 875-125 MG tablet Take 1 tablet by mouth 2 (two) times daily. 20 tablet 0   apixaban (ELIQUIS) 5 MG TABS tablet Take 1 tablet (5 mg total) by mouth 2 (two) times daily. 180 tablet 3   atorvastatin (LIPITOR) 80 MG tablet TAKE 1 TABLET BY MOUTH EVERY DAY (Patient taking differently: Take 80 mg by mouth at bedtime.) 90 tablet 3   azelastine (ASTELIN) 0.1 % nasal spray Place 2 sprays into both nostrils 2 (two) times daily. 30 mL 12   benzonatate (TESSALON) 100 MG capsule Take 1 capsule (100 mg total) by mouth 2 (two) times daily as needed for cough. 20 capsule 0   carvedilol (COREG) 12.5 MG tablet TAKE 1 TABLET BY MOUTH 2 TIMES  DAILY (Patient taking differently: Take 12.5 mg by mouth 2 (two) times daily with a meal.) 180 tablet 1   cetirizine (ZYRTEC) 10 MG tablet Take 10 mg by mouth daily as needed for allergies or rhinitis.     Continuous Glucose Sensor (FREESTYLE LIBRE 14 DAY SENSOR) MISC PLACE 1 DEVICE ON THE SKIN AS DIRECTED EVERY 14 DAYS (Patient taking differently: Inject 1 Device into the skin every 14 (fourteen) days.) 6 each 2   diclofenac Sodium (VOLTAREN) 1 % GEL Apply 2 g topically 4 (four) times daily. (Patient taking differently: Apply 2 g topically 4 (four) times daily as needed (pain).) 150 g 2   docusate sodium (COLACE) 100 MG capsule Take 2 capsules (200 mg total) by mouth daily. 180 capsule 3   Elastic  Bandages & Supports (B & B CARPAL TUNNEL BRACE) MISC Use brace on wrist as needed for wrist pain 2 each 10   Fluticasone-Umeclidin-Vilant (TRELEGY ELLIPTA) 100-62.5-25 MCG/ACT AEPB Inhale 1 puff into the lungs daily. 1 each 11   folic acid (FOLVITE) 1 MG tablet TAKE 1 TABLET BY MOUTH EVERY DAY (Patient taking differently: Take 1 mg by mouth daily.) 90 tablet 1   glucagon 1 MG injection Inject 1 mg into the vein once as needed for up to 1 dose. 1 each 12   glucose 4 GM chewable tablet Chew 1 tablet (4 g total) by mouth as needed for low blood sugar. 50 tablet 12   insulin glargine, 2 Unit Dial, (TOUJEO MAX SOLOSTAR) 300 UNIT/ML Solostar Pen Inject 40 Units into the skin daily. Patient assistance program provides. Patient reports 40 units 04/24/2022 (Patient taking differently: Inject 34 Units into the skin daily before breakfast.) 12 mL 1   ipratropium-albuterol (DUONEB) 0.5-2.5 (3) MG/3ML SOLN Take 3 mLs by nebulization every 6 (six) hours as needed (cough, wheezing, or shortness of breath). 360 mL 0   linaclotide (LINZESS) 145 MCG CAPS capsule Patient take 1 capsule daily.  May add an additional capsule for breakthrough constipation associated with opioid use (Patient taking differently: Take 145 mcg by mouth every other day.) 180 capsule 1   meclizine (ANTIVERT) 25 MG tablet Take 1-2 tablets (25-50 mg total) by mouth 2 (two) times daily as needed for dizziness. 90 tablet 0   memantine (NAMENDA) 5 MG tablet TAKE 2 TABLETS BY MOUTH EVERY MORNING and TAKE 1 TABLET BY MOUTH EVERY EVENING (Patient taking differently: Take 5-10 mg by mouth See admin instructions. Take 10 mg by mouth in the morning and 5 mg in the evening) 270 tablet 5   montelukast (SINGULAIR) 10 MG tablet Take 1 tablet (10 mg total) by mouth at bedtime. 30 tablet 3   nitroGLYCERIN (NITROSTAT) 0.4 MG SL tablet Take one tablet under the tongue every 5 minutes as needed for chest pain (Patient taking differently: Place 0.4 mg under the tongue  every 5 (five) minutes as needed for chest pain.) 50 tablet 0   NOVOLOG FLEXPEN 100 UNIT/ML FlexPen Inject 14 Units into the skin 2 (two) times daily with a meal. Max daily dose 70 units 60 mL 0   nystatin cream (MYCOSTATIN) Apply 1 Application topically 2 (two) times daily. 30 g 3   ondansetron (ZOFRAN) 4 MG tablet Take 1 tablet (4 mg total) by mouth every 8 (eight) hours as needed for nausea or vomiting. 20 tablet 0   oxyCODONE-acetaminophen (PERCOCET) 7.5-325 MG tablet Take 1 tablet by mouth 2 (two) times daily as needed (for pain).     pantoprazole (PROTONIX)  20 MG tablet TAKE 1 TABLET BY MOUTH EVERY DAY (Patient taking differently: Take 20 mg by mouth daily before breakfast.) 30 tablet 5   Semaglutide,0.25 or 0.5MG /DOS, (OZEMPIC, 0.25 OR 0.5 MG/DOSE,) 2 MG/3ML SOPN Inject 0.5 mg into the skin once a week. (Patient taking differently: Inject 0.5 mg into the skin every Monday.) 3 mL 11   sodium polystyrene (SPS) 15 GM/60ML suspension TAKE 60 MLS BY MOUTH ONCE WEEKLY TO KEEP POTASSIUM DOWN 240 mL 0   TYLENOL 500 MG tablet Take 1,000 mg by mouth every 6 (six) hours as needed for mild pain or headache.     TYLENOL PM EXTRA STRENGTH 500-25 MG TABS tablet Take 2 tablets by mouth at bedtime.     Vitamin D, Ergocalciferol, (DRISDOL) 1.25 MG (50000 UNIT) CAPS capsule TAKE 1 CAPSULE BY MOUTH ONCE WEEKLY 4 capsule 1   amitriptyline (ELAVIL) 50 MG tablet Take 1 tablet (50 mg total) by mouth at bedtime. 90 tablet 0   febuxostat (ULORIC) 40 MG tablet Take 1 tablet (40 mg total) by mouth daily. 90 tablet 3   Gabapentin 10 % CREA Apply 1 Application topically 3 (three) times daily as needed (neuropathy). 90 g 1   hydrOXYzine (ATARAX) 25 MG tablet TAKE 1 TO 2 TABLETS BY MOUTH EVERY 8 HOURS AS NEEDED FOR ANXIETY OR ITCHING 120 tablet 0   torsemide (DEMADEX) 20 MG tablet Take 1 tablet (20 mg total) by mouth daily. TAKE AN EXTRA DOES FOR WEIGHT GAIN of 3LBs IN 1 DAY OR 5LBS IN 1 WEEK Or for worsening swelling 30  tablet 0   fluticasone (FLONASE) 50 MCG/ACT nasal spray Place 2 sprays into both nostrils daily for 14 days. (Patient taking differently: Place 2 sprays into both nostrils every other day.) 1 g 0   No facility-administered medications prior to visit.    Family History  Problem Relation Age of Onset   Breast cancer Mother 11   Heart disease Mother    Throat cancer Father    Hypertension Father    Arthritis Father    Diabetes Father    Arthritis Sister    Obesity Sister    Diabetes Sister    Heart disease Cousin    Colon cancer Neg Hx    Stomach cancer Neg Hx    Esophageal cancer Neg Hx     Social History   Socioeconomic History   Marital status: Widowed    Spouse name: Not on file   Number of children: 3   Years of education: Not on file   Highest education level: High school graduate  Occupational History   Occupation: retired  Tobacco Use   Smoking status: Never    Passive exposure: Past   Smokeless tobacco: Never   Tobacco comments:    Both parents smoked, patient was exposed/ "Raised up in smoke, my whole life."  Vaping Use   Vaping status: Never Used  Substance and Sexual Activity   Alcohol use: No    Alcohol/week: 0.0 standard drinks of alcohol   Drug use: No   Sexual activity: Not on file  Other Topics Concern   Not on file  Social History Narrative   Not on file   Social Determinants of Health   Financial Resource Strain: Low Risk  (06/05/2022)   Overall Financial Resource Strain (CARDIA)    Difficulty of Paying Living Expenses: Not hard at all  Food Insecurity: No Food Insecurity (01/09/2023)   Hunger Vital Sign    Worried About Running  Out of Food in the Last Year: Never true    Ran Out of Food in the Last Year: Never true  Transportation Needs: No Transportation Needs (01/03/2023)   PRAPARE - Administrator, Civil Service (Medical): No    Lack of Transportation (Non-Medical): No  Physical Activity: Insufficiently Active (06/05/2022)    Exercise Vital Sign    Days of Exercise per Week: 3 days    Minutes of Exercise per Session: 10 min  Stress: Stress Concern Present (06/05/2022)   [employer] of Occupational Health - Occupational Stress Questionnaire    Feeling of Stress : Rather much  Social Connections: Moderately Isolated (04/20/2022)   Social Connection and Isolation Panel [NHANES]    Frequency of Communication with Friends and Family: Once a week    Frequency of Social Gatherings with Friends and Family: Once a week    Attends Religious Services: 1 to 4 times per year    Active Member of [employer] or Organizations: Yes    Attends Banker Meetings: 1 to 4 times per year    Marital Status: Widowed  Intimate Partner Violence: Not At Risk (01/03/2023)   Humiliation, Afraid, Rape, and Kick questionnaire    Fear of Current or Ex-Partner: No    Emotionally Abused: No    Physically Abused: No    Sexually Abused: No                                                                                                  Objective:  Physical Exam: BP 128/78 (BP Location: Right Arm, Patient Position: Sitting, Cuff Size: Large)   Pulse 70   Temp 97.7 F (36.5 C) (Temporal)   Wt 270 lb 6.4 oz (122.7 kg)   SpO2 98%   BMI 42.35 kg/m     Physical Exam Constitutional:      Appearance: She is obese. She is not ill-appearing.  HENT:     Head: Normocephalic and atraumatic.     Right Ear: Hearing, tympanic membrane and ear canal normal. No middle ear effusion.     Left Ear: Hearing, tympanic membrane and ear canal normal.     Nose: Nose normal.  Eyes:     General: No scleral icterus.       Right eye: No discharge.        Left eye: No discharge.     Extraocular Movements: Extraocular movements intact.  Cardiovascular:     Rate and Rhythm: Normal rate and regular rhythm.     Heart sounds: Normal heart sounds.     Comments: No cyanosis, no JVD Pulmonary:     Effort: Pulmonary effort is normal.     Breath  sounds: Normal breath sounds. No decreased air movement.     Comments: No auditory wheezing Abdominal:     General: Abdomen is flat. There is no distension.     Palpations: Abdomen is soft.     Tenderness: There is no abdominal tenderness.  Musculoskeletal:     Right lower leg: Edema present.     Left lower leg: Edema present.  Comments: Normal Ambulation. No clubbing  Skin:    General: Skin is warm.     Findings: No rash.  Neurological:     General: No focal deficit present.     Mental Status: She is alert.     Cranial Nerves: No cranial nerve deficit.     Motor: No weakness (Wheelchair-bound).  Psychiatric:        Attention and Perception: She is attentive.        Judgment: Judgment is not impulsive.     CT ABDOMEN PELVIS WO CONTRAST  Result Date: 01/03/2023 CLINICAL DATA:  Epigastric pain. EXAM: CT ABDOMEN AND PELVIS WITHOUT CONTRAST TECHNIQUE: Multidetector CT imaging of the abdomen and pelvis was performed following the standard protocol without IV contrast. RADIATION DOSE REDUCTION: This exam was performed according to the departmental dose-optimization program which includes automated exposure control, adjustment of the mA and/or kV according to patient size and/or use of iterative reconstruction technique. COMPARISON:  CT abdomen pelvis dated 03/26/2022. FINDINGS: Evaluation of this exam is limited in the absence of intravenous contrast. Lower chest: The visualized lung bases are clear. There is advanced 3 vessel coronary vascular calcification. No intra-abdominal free air or free fluid. Hepatobiliary: The liver is unremarkable. No biliary dilatation. Multiple gallstones. No pericholecystic fluid or evidence of acute cholecystitis by CT. Pancreas: Unremarkable. No pancreatic ductal dilatation or surrounding inflammatory changes. Spleen: Normal in size without focal abnormality. Adrenals/Urinary Tract: The adrenal glands are unremarkable. There is no hydronephrosis or  nephrolithiasis on either side. Bilateral renal cysts. There is no hydronephrosis or nephrolithiasis on either side. The visualized ureters and urinary bladder appear unremarkable. Stomach/Bowel: There is postsurgical changes of bowel with multiple anastomotic staple lines. There is no bowel obstruction or active inflammation. Appendectomy. Vascular/Lymphatic: Mild aortoiliac atherosclerotic disease. The IVC is unremarkable. No portal venous gas. There is no adenopathy. Reproductive: The uterus is anteverted and grossly unremarkable. No adnexal masses. Other: Anterior abdominal wall hernia repair.  No recurrent hernia. Musculoskeletal: Osteopenia with degenerative changes. No acute osseous pathology. IMPRESSION: 1. No acute intra-abdominal or pelvic pathology. 2. Cholelithiasis. 3.  Aortic Atherosclerosis (ICD10-I70.0). Electronically Signed   By: Elgie Collard M.D.   On: 01/03/2023 21:32   CT Chest Wo Contrast  Result Date: 12/29/2022 CLINICAL DATA:  Chest pain, abnormal chest x-ray EXAM: CT CHEST WITHOUT CONTRAST TECHNIQUE: Multidetector CT imaging of the chest was performed following the standard protocol without IV contrast. RADIATION DOSE REDUCTION: This exam was performed according to the departmental dose-optimization program which includes automated exposure control, adjustment of the mA and/or kV according to patient size and/or use of iterative reconstruction technique. COMPARISON:  Chest x-ray dated December 29, 2022; CT chest, abdomen and pelvis dated February 08, 2022; chest CT dated July 04, 2021 FINDINGS: Cardiovascular: Normal heart size. No pericardial effusion. Normal caliber thoracic aorta with moderate atherosclerotic disease. Severe coronary artery calcifications. Mediastinum/Nodes: Small hiatal hernia. Thyroid is unremarkable. No enlarged lymph nodes seen in the chest. Lungs/Pleura: Central airways are patent. No consolidation, pleural effusion or pneumothorax. No reticular opacities,  traction bronchiectasis or honeycomb change. Small solid nodule of the left upper lobe measuring 4 mm on series 4, image 138, no follow-up imaging is necessary given greater than 1 year stability. Upper Abdomen: Gallstones. Low-attenuation right renal lesions which are likely simple cysts. Musculoskeletal: No chest wall mass or suspicious bone lesions identified. IMPRESSION: 1. No acute findings in the chest or evidence interstitial lung disease. 2. Coronary artery calcifications aortic Atherosclerosis (ICD10-I70.0). Electronically Signed  By: Allegra Lai M.D.   On: 12/29/2022 17:03   DG Chest Port 1 View  Result Date: 12/29/2022 CLINICAL DATA:  Chest pain. EXAM: PORTABLE CHEST 1 VIEW COMPARISON:  Chest x-ray dated July 12, 2022. FINDINGS: The heart size and mediastinal contours are within normal limits. Mildly increased ill-defined interstitial opacities, worst at the lung bases. No focal consolidation, pleural effusion, or pneumothorax. No acute osseous abnormality. IMPRESSION: 1. Increased ill-defined interstitial opacities, worst at the lung bases, concerning for atypical infection or mild interstitial pulmonary edema. Electronically Signed   By: Obie Dredge M.D.   On: 12/29/2022 13:44    Recent Results (from the past 2160 hour(s))  SARS Coronavirus 2 by RT PCR (hospital order, performed in [facility] hospital lab) *cepheid single result test* Anterior Nasal Swab     Status: None   Collection Time: 12/29/22 12:37 PM   Specimen: Anterior Nasal Swab  Result Value Ref Range   SARS Coronavirus 2 by RT PCR NEGATIVE NEGATIVE    Comment: Performed at [facility], [address] [place], [place] [address]  CBC     Status: Abnormal   Collection Time: 12/29/22 12:39 PM  Result Value Ref Range   WBC 12.4 (H) 4.0 - 10.5 K/uL   RBC 2.75 (L) 3.87 - 5.11 MIL/uL   Hemoglobin 7.6 (L) 12.0 - 15.0 g/dL   HCT 21.3 (L) 08.6 - 57.8 %   MCV 92.0 80.0 - 100.0 fL   MCH 27.6 26.0 - 34.0 pg   MCHC  30.0 30.0 - 36.0 g/dL   RDW 46.9 62.9 - 52.8 %   Platelets 246 150 - 400 K/uL   nRBC 0.0 0.0 - 0.2 %    Comment: Performed at [facility], [address] [place], [place] [address]  Troponin I (High Sensitivity)     Status: None   Collection Time: 12/29/22 12:39 PM  Result Value Ref Range   Troponin I (High Sensitivity) 13 <18 ng/L    Comment: (NOTE) Elevated high sensitivity troponin I (hsTnI) values and significant  changes across serial measurements may suggest ACS but many other  chronic and acute conditions are known to elevate hsTnI results.  Refer to the "Links" section for chest pain algorithms and additional  guidance. Performed at [facility], [address], [place], [place] [address]   Comprehensive metabolic panel     Status: Abnormal   Collection Time: 12/29/22 12:39 PM  Result Value Ref Range   Sodium 137 135 - 145 mmol/L   Potassium 4.2 3.5 - 5.1 mmol/L   Chloride 100 98 - 111 mmol/L   CO2 21 (L) 22 - 32 mmol/L   Glucose, Bld 218 (H) 70 - 99 mg/dL    Comment: Glucose reference range applies only to samples taken after fasting for at least 8 hours.   BUN 100 (H) 8 - 23 mg/dL   Creatinine, Ser 7.25 (H) 0.44 - 1.00 mg/dL   Calcium 8.5 (L) 8.9 - 10.3 mg/dL   Total Protein 6.9 6.5 - 8.1 g/dL   Albumin 2.6 (L) 3.5 - 5.0 g/dL   AST 16 15 - 41 U/L   ALT 12 0 - 44 U/L   Alkaline Phosphatase 82 38 - 126 U/L   Total Bilirubin 0.4 0.3 - 1.2 mg/dL   GFR, Estimated 16 (L) >60 mL/min    Comment: (NOTE) Calculated using the CKD-EPI Creatinine Equation (2021)    Anion gap 16 (H) 5 - 15  Comment: Performed at [facility], [address], [place], [place] [address]  D-dimer, quantitative     Status: Abnormal   Collection Time: 12/29/22 12:39 PM  Result Value Ref Range   D-Dimer, Quant 0.55 (H) 0.00 - 0.50 ug/mL-FEU    Comment: (NOTE) At the manufacturer cut-off value of 0.5 g/mL FEU, this assay has a negative predictive value of 95-100%.This assay  is intended for use in conjunction with a clinical pretest probability (PTP) assessment model to exclude pulmonary embolism (PE) and deep venous thrombosis (DVT) in outpatients suspected of PE or DVT. Results should be correlated with clinical presentation. Performed at [facility], [address], [place], [place] [address]   POC occult blood, ED Provider will collect     Status: None   Collection Time: 12/29/22  1:26 PM  Result Value Ref Range   Fecal Occult Bld NEGATIVE NEGATIVE  Urinalysis, Routine w reflex microscopic -Urine, Clean Catch     Status: Abnormal   Collection Time: 12/29/22  2:42 PM  Result Value Ref Range   Color, Urine YELLOW YELLOW   APPearance HAZY (A) CLEAR   Specific Gravity, Urine 1.009 1.005 - 1.030   pH 7.0 5.0 - 8.0   Glucose, UA NEGATIVE NEGATIVE mg/dL   Hgb urine dipstick NEGATIVE NEGATIVE   Bilirubin Urine NEGATIVE NEGATIVE   Ketones, ur NEGATIVE NEGATIVE mg/dL   Protein, ur NEGATIVE NEGATIVE mg/dL   Nitrite NEGATIVE NEGATIVE   Leukocytes,Ua LARGE (A) NEGATIVE   RBC / HPF 0-5 0 - 5 RBC/hpf   WBC, UA >50 0 - 5 WBC/hpf   Bacteria, UA FEW (A) NONE SEEN   Squamous Epithelial / HPF 0-5 0 - 5 /HPF   Mucus PRESENT     Comment: Performed at [facility], [address], [place], [place] [address]  Troponin I (High Sensitivity)     Status: None   Collection Time: 12/29/22  2:42 PM  Result Value Ref Range   Troponin I (High Sensitivity) 11 <18 ng/L    Comment: (NOTE) Elevated high sensitivity troponin I (hsTnI) values and significant  changes across serial measurements may suggest ACS but many other  chronic and acute conditions are known to elevate hsTnI results.  Refer to the "Links" section for chest pain algorithms and additional  guidance. Performed at [facility], [address], [place], [place] [address]   Iron and Iron Binding Capacity ([facility] only)     Status: Abnormal   Collection Time: 01/03/23  2:15 PM  Result  Value Ref Range   Iron 15 (L) 28 - 170 ug/dL   TIBC 295 (L) 621 - 308 ug/dL   Saturation Ratios 8 (L) 10.4 - 31.8 %   UIBC 178 ug/dL    Comment: Performed at [facility], [address] [place], [place] [address]  CMP (Cancer Center only)     Status: Abnormal   Collection Time: 01/03/23  2:15 PM  Result Value Ref Range   Sodium 138 135 - 145 mmol/L   Potassium 4.3 3.5 - 5.1 mmol/L   Chloride 105 98 - 111 mmol/L   CO2 29 22 - 32 mmol/L   Glucose, Bld 144 (H) 70 - 99 mg/dL    Comment: Glucose reference range applies only to samples taken after fasting for at least 8 hours.   BUN 77 (H) 8 - 23 mg/dL   Creatinine 6.96 (H) 2.95 - 1.00 mg/dL   Calcium 8.5 (L)  8.9 - 10.3 mg/dL   Total Protein 6.8 6.5 - 8.1 g/dL   Albumin 3.0 (L) 3.5 - 5.0 g/dL   AST 20 15 - 41 U/L   ALT 12 0 - 44 U/L   Alkaline Phosphatase 73 38 - 126 U/L   Total Bilirubin 0.3 0.3 - 1.2 mg/dL   GFR, Estimated 21 (L) >60 mL/min    Comment: (NOTE) Calculated using the CKD-EPI Creatinine Equation (2021)    Anion gap 4 (L) 5 - 15    Comment: Performed at [facility], [address], [place], [place] [address]  CBC with Differential (Cancer Center Only)     Status: Abnormal   Collection Time: 01/03/23  2:15 PM  Result Value Ref Range   WBC Count 6.4 4.0 - 10.5 K/uL   RBC 2.53 (L) 3.87 - 5.11 MIL/uL   Hemoglobin 7.1 (L) 12.0 - 15.0 g/dL   HCT 02.7 (L) 25.3 - 66.4 %   MCV 91.3 80.0 - 100.0 fL   MCH 28.1 26.0 - 34.0 pg   MCHC 30.7 30.0 - 36.0 g/dL   RDW 40.3 47.4 - 25.9 %   Platelet Count 216 150 - 400 K/uL   nRBC 0.0 0.0 - 0.2 %   Neutrophils Relative % 67 %   Neutro Abs 4.2 1.7 - 7.7 K/uL   Lymphocytes Relative 18 %   Lymphs Abs 1.2 0.7 - 4.0 K/uL   Monocytes Relative 11 %   Monocytes Absolute 0.7 0.1 - 1.0 K/uL   Eosinophils Relative 4 %   Eosinophils Absolute 0.3 0.0 - 0.5 K/uL   Basophils Relative 0 %   Basophils Absolute 0.0 0.0 - 0.1 K/uL   Immature  Granulocytes 0 %   Abs Immature Granulocytes 0.02 0.00 - 0.07 K/uL    Comment: Performed at [facility], [address], [place], [place] [address]  Ferritin     Status: None   Collection Time: 01/03/23  2:16 PM  Result Value Ref Range   Ferritin 77 11 - 307 ng/mL    Comment: Performed at [facility], [address] [place], [place] [address]  CBC with Differential     Status: Abnormal   Collection Time: 01/03/23  4:35 PM  Result Value Ref Range   WBC 7.6 4.0 - 10.5 K/uL   RBC 2.52 (L) 3.87 - 5.11 MIL/uL   Hemoglobin 7.0 (L) 12.0 - 15.0 g/dL   HCT 29.5 (L) 18.8 - 41.6 %   MCV 92.9 80.0 - 100.0 fL   MCH 27.8 26.0 - 34.0 pg   MCHC 29.9 (L) 30.0 - 36.0 g/dL   RDW 60.6 30.1 - 60.1 %   Platelets 213 150 - 400 K/uL   nRBC 0.0 0.0 - 0.2 %   Neutrophils Relative % 66 %   Neutro Abs 4.9 1.7 - 7.7 K/uL   Lymphocytes Relative 19 %   Lymphs Abs 1.4 0.7 - 4.0 K/uL   Monocytes Relative 11 %   Monocytes Absolute 0.9 0.1 - 1.0 K/uL   Eosinophils Relative 4 %   Eosinophils Absolute 0.3 0.0 - 0.5 K/uL   Basophils Relative 0 %   Basophils Absolute 0.0 0.0 - 0.1 K/uL   Immature Granulocytes 0 %   Abs Immature Granulocytes 0.02 0.00 - 0.07 K/uL    Comment: Performed at [facility], [address] [place], [place] [address]  Comprehensive metabolic panel     Status: Abnormal   Collection  Time: 01/03/23  4:35 PM  Result Value Ref Range   Sodium 137 135 - 145 mmol/L   Potassium 4.6 3.5 - 5.1 mmol/L   Chloride 103 98 - 111 mmol/L   CO2 25 22 - 32 mmol/L   Glucose, Bld 142 (H) 70 - 99 mg/dL    Comment: Glucose reference range applies only to samples taken after fasting for at least 8 hours.   BUN 75 (H) 8 - 23 mg/dL   Creatinine, Ser 6.04 (H) 0.44 - 1.00 mg/dL   Calcium 8.7 (L) 8.9 - 10.3 mg/dL   Total Protein 6.7 6.5 - 8.1 g/dL   Albumin 2.7 (L) 3.5 - 5.0 g/dL   AST 22 15 - 41 U/L   ALT 16 0 - 44 U/L   Alkaline Phosphatase  69 38 - 126 U/L   Total Bilirubin 0.4 0.3 - 1.2 mg/dL   GFR, Estimated 20 (L) >60 mL/min    Comment: (NOTE) Calculated using the CKD-EPI Creatinine Equation (2021)    Anion gap 9 5 - 15    Comment: Performed at [facility], [address], [place], [place] [address]  Hemoglobin A1c     Status: Abnormal   Collection Time: 01/03/23  4:35 PM  Result Value Ref Range   Hgb A1c MFr Bld 7.2 (H) 4.8 - 5.6 %    Comment: (NOTE) Pre diabetes:          5.7%-6.4%  Diabetes:              >6.4%  Glycemic control for   <7.0% adults with diabetes    Mean Plasma Glucose 159.94 mg/dL    Comment: Performed at [facility], [address], [place], [place] [address]  Reticulocytes     Status: Abnormal   Collection Time: 01/03/23  4:35 PM  Result Value Ref Range   Retic Ct Pct 2.0 0.4 - 3.1 %   RBC. 2.52 (L) 3.87 - 5.11 MIL/uL   Retic Count, Absolute 51.4 19.0 - 186.0 K/uL   Immature Retic Fract 20.6 (H) 2.3 - 15.9 %    Comment: Performed at [facility], [address], [place], [place] [address]  TSH     Status: None   Collection Time: 01/03/23  4:35 PM  Result Value Ref Range   TSH 0.804 0.350 - 4.500 uIU/mL    Comment: Performed by a 3rd Generation assay with a functional sensitivity of <=0.01 uIU/mL. Performed at [facility], [address] [place], [place] [address]   Phosphorus     Status: None   Collection Time: 01/03/23  4:35 PM  Result Value Ref Range   Phosphorus 3.2 2.5 - 4.6 mg/dL    Comment: Performed at [facility], [address] [place], [place] [address]  Magnesium     Status: Abnormal   Collection Time: 01/03/23  4:35 PM  Result Value Ref Range   Magnesium 1.5 (L) 1.7 - 2.4 mg/dL    Comment: Performed at [facility], [address] [place], [place] [address]  CK     Status: None   Collection Time: 01/03/23  4:35 PM  Result Value Ref Range   Total CK 89 38 - 234  U/L    Comment: Performed at [facility], [address], [place], [place] [address]  Type and screen [facility]     Status: None   Collection Time: 01/03/23  6:00  PM  Result Value Ref Range   ABO/RH(D) A POS    Antibody Screen NEG    Sample Expiration 01/06/2023,2359    Unit Number [id number]    Blood Component Type RED CELLS,LR    Unit division 00    Status of Unit ISSUED,FINAL    Transfusion Status OK TO TRANSFUSE    Crossmatch Result Compatible    Unit Number [id number]    Blood Component Type RED CELLS,LR    Unit division 00    Status of Unit ISSUED,FINAL    Transfusion Status OK TO TRANSFUSE    Crossmatch Result      Compatible Performed at [facility], [address], [place], [place] [address]   Prepare RBC (crossmatch)     Status: None   Collection Time: 01/03/23  6:00 PM  Result Value Ref Range   Order Confirmation      ORDER PROCESSED BY BLOOD BANK Performed at [facility], [address], [place], [place] [address]   BPAM RBC     Status: None   Collection Time: 01/03/23  6:00 PM  Result Value Ref Range   ISSUE DATE / TIME [id number]    Blood Product Unit Number [id number]    PRODUCT CODE [id number]    Unit Type and Rh 6200    Blood Product Expiration Date 638756433295    ISSUE DATE / TIME 188416606301    Blood Product Unit Number [id number]    PRODUCT CODE [id number]    Unit Type and Rh 6200    Blood Product Expiration Date 623762831517   POC occult blood, ED Provider will collect     Status: Abnormal   Collection Time: 01/03/23  6:11 PM  Result Value Ref Range   Fecal Occult Bld POSITIVE (A) NEGATIVE  CBG monitoring, ED     Status: Abnormal   Collection Time: 01/03/23  8:03 PM  Result Value Ref Range   Glucose-Capillary 145 (H) 70 - 99 mg/dL    Comment: Glucose reference range applies only to samples taken after fasting for at least 8 hours.  Glucose, capillary      Status: Abnormal   Collection Time: 01/03/23 10:28 PM  Result Value Ref Range   Glucose-Capillary 117 (H) 70 - 99 mg/dL    Comment: Glucose reference range applies only to samples taken after fasting for at least 8 hours.  Glucose, capillary     Status: Abnormal   Collection Time: 01/04/23  3:55 AM  Result Value Ref Range   Glucose-Capillary 118 (H) 70 - 99 mg/dL    Comment: Glucose reference range applies only to samples taken after fasting for at least 8 hours.  Vitamin B12     Status: None   Collection Time: 01/04/23  4:25 AM  Result Value Ref Range   Vitamin B-12 367 180 - 914 pg/mL    Comment: (NOTE) This assay is not validated for testing neonatal or myeloproliferative syndrome specimens for Vitamin B12 levels. Performed at [facility], [address] [place], [place] [address]   Folate     Status: None   Collection Time: 01/04/23  4:25 AM  Result Value Ref Range   Folate 38.5 >5.9 ng/mL    Comment: Performed at [facility], [address], [place], [place] [address]  Prealbumin     Status: Abnormal   Collection Time: 01/04/23  4:25 AM  Result Value Ref Range   Prealbumin 10 (L) 18 - 38 mg/dL  Comment: Performed at [facility], [address], [place], [place] [address]  Magnesium     Status: None   Collection Time: 01/04/23  4:25 AM  Result Value Ref Range   Magnesium 1.7 1.7 - 2.4 mg/dL    Comment: Performed at [facility], [address] [place], [place] [address]  Phosphorus     Status: None   Collection Time: 01/04/23  4:25 AM  Result Value Ref Range   Phosphorus 3.0 2.5 - 4.6 mg/dL    Comment: Performed at [facility], [address] [place], [place] [address]  Comprehensive metabolic panel     Status: Abnormal   Collection Time: 01/04/23  4:25 AM  Result Value Ref Range   Sodium 132 (L) 135 - 145 mmol/L   Potassium 4.0 3.5 - 5.1 mmol/L   Chloride 101 98 - 111 mmol/L    CO2 22 22 - 32 mmol/L   Glucose, Bld 117 (H) 70 - 99 mg/dL    Comment: Glucose reference range applies only to samples taken after fasting for at least 8 hours.   BUN 73 (H) 8 - 23 mg/dL   Creatinine, Ser 8.84 (H) 0.44 - 1.00 mg/dL   Calcium 7.8 (L) 8.9 - 10.3 mg/dL   Total Protein 6.3 (L) 6.5 - 8.1 g/dL   Albumin 2.4 (L) 3.5 - 5.0 g/dL   AST 20 15 - 41 U/L   ALT 15 0 - 44 U/L   Alkaline Phosphatase 66 38 - 126 U/L   Total Bilirubin 0.8 0.3 - 1.2 mg/dL   GFR, Estimated 22 (L) >60 mL/min    Comment: (NOTE) Calculated using the CKD-EPI Creatinine Equation (2021)    Anion gap 9 5 - 15    Comment: Performed at [facility], [address] [place], [place] [address]  CBC     Status: Abnormal   Collection Time: 01/04/23  4:25 AM  Result Value Ref Range   WBC 8.3 4.0 - 10.5 K/uL   RBC 2.71 (L) 3.87 - 5.11 MIL/uL   Hemoglobin 7.8 (L) 12.0 - 15.0 g/dL   HCT 30.1 (L) 60.1 - 09.3 %   MCV 91.5 80.0 - 100.0 fL   MCH 28.8 26.0 - 34.0 pg   MCHC 31.5 30.0 - 36.0 g/dL   RDW 23.5 57.3 - 22.0 %   Platelets 194 150 - 400 K/uL   nRBC 0.0 0.0 - 0.2 %    Comment: Performed at [facility], [address] [place], [place] [address]  Iron and TIBC     Status: Abnormal   Collection Time: 01/04/23  4:25 AM  Result Value Ref Range   Iron 62 28 - 170 ug/dL   TIBC 062 (L) 376 - 283 ug/dL   Saturation Ratios 38 (H) 10.4 - 31.8 %   UIBC 103 ug/dL    Comment: Performed at [facility], [address], [place], [place] [address]  Ferritin     Status: None   Collection Time: 01/04/23  4:25 AM  Result Value Ref Range   Ferritin 65 11 - 307 ng/mL    Comment: Performed at [facility], [address] [place], [place] [address]  Glucose, capillary     Status: None   Collection Time: 01/04/23  7:35 AM  Result Value Ref Range   Glucose-Capillary 82 70 - 99 mg/dL    Comment: Glucose reference range applies only to samples taken  after fasting for at least 8 hours.  CBC     Status: Abnormal   Collection Time: 01/04/23 10:15 AM  Result Value Ref Range   WBC 8.3 4.0 - 10.5 K/uL   RBC 3.04 (L) 3.87 - 5.11 MIL/uL   Hemoglobin 8.6 (L) 12.0 - 15.0 g/dL   HCT 78.2 (L) 95.6 - 21.3 %   MCV 91.1 80.0 - 100.0 fL   MCH 28.3 26.0 - 34.0 pg   MCHC 31.0 30.0 - 36.0 g/dL   RDW 08.6 57.8 - 46.9 %   Platelets 195 150 - 400 K/uL   nRBC 0.0 0.0 - 0.2 %    Comment: Performed at [facility], [address] [place], [place] [address]  Glucose, capillary     Status: None   Collection Time: 01/04/23 11:40 AM  Result Value Ref Range   Glucose-Capillary 94 70 - 99 mg/dL    Comment: Glucose reference range applies only to samples taken after fasting for at least 8 hours.  CBC     Status: Abnormal   Collection Time: 01/04/23  2:16 PM  Result Value Ref Range   WBC 8.7 4.0 - 10.5 K/uL   RBC 3.15 (L) 3.87 - 5.11 MIL/uL   Hemoglobin 9.0 (L) 12.0 - 15.0 g/dL   HCT 84.1 (L) 32.4 - 40.1 %   MCV 94.0 80.0 - 100.0 fL   MCH 28.6 26.0 - 34.0 pg   MCHC 30.4 30.0 - 36.0 g/dL   RDW 02.7 25.3 - 66.4 %   Platelets 194 150 - 400 K/uL   nRBC 0.0 0.0 - 0.2 %    Comment: Performed at [facility], [address], [place], [place] [address]  Glucose, capillary     Status: Abnormal   Collection Time: 01/04/23  4:51 PM  Result Value Ref Range   Glucose-Capillary 172 (H) 70 - 99 mg/dL    Comment: Glucose reference range applies only to samples taken after fasting for at least 8 hours.  CBC     Status: Abnormal   Collection Time: 01/04/23  7:20 PM  Result Value Ref Range   WBC 7.1 4.0 - 10.5 K/uL   RBC 2.83 (L) 3.87 - 5.11 MIL/uL   Hemoglobin 8.0 (L) 12.0 - 15.0 g/dL   HCT 42.5 (L) 95.6 - 38.7 %   MCV 93.3 80.0 - 100.0 fL   MCH 28.3 26.0 - 34.0 pg   MCHC 30.3 30.0 - 36.0 g/dL   RDW 56.4 33.2 - 95.1 %   Platelets 182 150 - 400 K/uL   nRBC 0.0 0.0 - 0.2 %    Comment: Performed at [facility], [address], [place], [place] [address]  Glucose, capillary     Status: Abnormal   Collection Time: 01/04/23  8:39 PM  Result Value Ref Range   Glucose-Capillary 181 (H) 70 - 99 mg/dL    Comment: Glucose reference range applies only to samples taken after fasting for at least 8 hours.  Glucose, capillary     Status: None   Collection Time: 01/04/23 11:59 PM  Result Value Ref Range   Glucose-Capillary 93 70 - 99 mg/dL    Comment: Glucose reference range applies only to samples taken after fasting for at least 8 hours.   Comment 1 Notify RN   CBC with Differential/Platelet     Status: Abnormal   Collection Time: 01/05/23  1:00 AM  Result Value Ref Range   WBC 7.3  4.0 - 10.5 K/uL   RBC 2.77 (L) 3.87 - 5.11 MIL/uL   Hemoglobin 7.7 (L) 12.0 - 15.0 g/dL   HCT 16.1 (L) 09.6 - 04.5 %   MCV 92.1 80.0 - 100.0 fL   MCH 27.8 26.0 - 34.0 pg   MCHC 30.2 30.0 - 36.0 g/dL   RDW 40.9 81.1 - 91.4 %   Platelets 188 150 - 400 K/uL   nRBC 0.0 0.0 - 0.2 %   Neutrophils Relative % 60 %   Neutro Abs 4.4 1.7 - 7.7 K/uL   Lymphocytes Relative 26 %   Lymphs Abs 1.9 0.7 - 4.0 K/uL   Monocytes Relative 9 %   Monocytes Absolute 0.7 0.1 - 1.0 K/uL   Eosinophils Relative 5 %   Eosinophils Absolute 0.3 0.0 - 0.5 K/uL   Basophils Relative 0 %   Basophils Absolute 0.0 0.0 - 0.1 K/uL   Immature Granulocytes 0 %   Abs Immature Granulocytes 0.03 0.00 - 0.07 K/uL    Comment: Performed at [facility], [address] [place], [place] [address]  Basic metabolic panel     Status: Abnormal   Collection Time: 01/05/23  1:02 AM  Result Value Ref Range   Sodium 136 135 - 145 mmol/L   Potassium 4.7 3.5 - 5.1 mmol/L   Chloride 107 98 - 111 mmol/L   CO2 22 22 - 32 mmol/L   Glucose, Bld 83 70 - 99 mg/dL    Comment: Glucose reference range applies only to samples taken after fasting for at least 8 hours.   BUN 59 (H) 8 - 23 mg/dL   Creatinine, Ser 6.21 (H) 0.44 - 1.00 mg/dL   Calcium  8.0 (L) 8.9 - 10.3 mg/dL   GFR, Estimated 24 (L) >60 mL/min    Comment: (NOTE) Calculated using the CKD-EPI Creatinine Equation (2021)    Anion gap 7 5 - 15    Comment: Performed at [facility], [address], [place], [place] [address]  Glucose, capillary     Status: None   Collection Time: 01/05/23  4:21 AM  Result Value Ref Range   Glucose-Capillary 81 70 - 99 mg/dL    Comment: Glucose reference range applies only to samples taken after fasting for at least 8 hours.   Comment 1 Document in Chart   Glucose, capillary     Status: None   Collection Time: 01/05/23  7:30 AM  Result Value Ref Range   Glucose-Capillary 92 70 - 99 mg/dL    Comment: Glucose reference range applies only to samples taken after fasting for at least 8 hours.  Glucose, capillary     Status: None   Collection Time: 01/05/23 11:48 AM  Result Value Ref Range   Glucose-Capillary 99 70 - 99 mg/dL    Comment: Glucose reference range applies only to samples taken after fasting for at least 8 hours.  Prepare RBC (crossmatch)     Status: None   Collection Time: 01/05/23  3:02 PM  Result Value Ref Range   Order Confirmation      ORDER PROCESSED BY BLOOD BANK Performed at [facility], [address] [place], [place] [address]   Glucose, capillary     Status: Abnormal   Collection Time: 01/05/23  5:37 PM  Result Value Ref Range   Glucose-Capillary 125 (H) 70 - 99 mg/dL    Comment: Glucose reference range applies only to samples taken after fasting for at least 8  hours.  Glucose, capillary     Status: Abnormal   Collection Time: 01/05/23  8:20 PM  Result Value Ref Range   Glucose-Capillary 253 (H) 70 - 99 mg/dL    Comment: Glucose reference range applies only to samples taken after fasting for at least 8 hours.   Comment 1 Notify RN   Glucose, capillary     Status: Abnormal   Collection Time: 01/06/23 12:01 AM  Result Value Ref Range   Glucose-Capillary 195 (H) 70 - 99  mg/dL    Comment: Glucose reference range applies only to samples taken after fasting for at least 8 hours.   Comment 1 Notify RN   CBC with Differential/Platelet     Status: Abnormal   Collection Time: 01/06/23  4:36 AM  Result Value Ref Range   WBC 9.7 4.0 - 10.5 K/uL   RBC 2.97 (L) 3.87 - 5.11 MIL/uL   Hemoglobin 8.5 (L) 12.0 - 15.0 g/dL   HCT 08.6 (L) 57.8 - 46.9 %   MCV 92.6 80.0 - 100.0 fL   MCH 28.6 26.0 - 34.0 pg   MCHC 30.9 30.0 - 36.0 g/dL   RDW 62.9 52.8 - 41.3 %   Platelets 181 150 - 400 K/uL   nRBC 0.0 0.0 - 0.2 %   Neutrophils Relative % 60 %   Neutro Abs 5.8 1.7 - 7.7 K/uL   Lymphocytes Relative 28 %   Lymphs Abs 2.8 0.7 - 4.0 K/uL   Monocytes Relative 7 %   Monocytes Absolute 0.7 0.1 - 1.0 K/uL   Eosinophils Relative 4 %   Eosinophils Absolute 0.4 0.0 - 0.5 K/uL   Basophils Relative 0 %   Basophils Absolute 0.0 0.0 - 0.1 K/uL   Immature Granulocytes 1 %   Abs Immature Granulocytes 0.05 0.00 - 0.07 K/uL    Comment: Performed at [facility], [address], [place], [place] [address]  Basic metabolic panel     Status: Abnormal   Collection Time: 01/06/23  4:36 AM  Result Value Ref Range   Sodium 138 135 - 145 mmol/L   Potassium 4.7 3.5 - 5.1 mmol/L   Chloride 107 98 - 111 mmol/L   CO2 23 22 - 32 mmol/L   Glucose, Bld 79 70 - 99 mg/dL    Comment: Glucose reference range applies only to samples taken after fasting for at least 8 hours.   BUN 55 (H) 8 - 23 mg/dL   Creatinine, Ser 0.27 (H) 0.44 - 1.00 mg/dL   Calcium 8.2 (L) 8.9 - 10.3 mg/dL   GFR, Estimated 27 (L) >60 mL/min    Comment: (NOTE) Calculated using the CKD-EPI Creatinine Equation (2021)    Anion gap 8 5 - 15    Comment: Performed at [facility], [address], [place], [place] [address]  Glucose, capillary     Status: Abnormal   Collection Time: 01/06/23  5:44 AM  Result Value Ref Range   Glucose-Capillary 66 (L) 70 - 99 mg/dL    Comment: Glucose  reference range applies only to samples taken after fasting for at least 8 hours.  Glucose, capillary     Status: Abnormal   Collection Time: 01/06/23  6:14 AM  Result Value Ref Range   Glucose-Capillary 103 (H) 70 - 99 mg/dL    Comment: Glucose reference range applies only to samples taken after fasting for at least 8 hours.  Glucose, capillary     Status: Abnormal   Collection  Time: 01/06/23  7:52 AM  Result Value Ref Range   Glucose-Capillary 115 (H) 70 - 99 mg/dL    Comment: Glucose reference range applies only to samples taken after fasting for at least 8 hours.  Glucose, capillary     Status: Abnormal   Collection Time: 01/06/23 12:27 PM  Result Value Ref Range   Glucose-Capillary 226 (H) 70 - 99 mg/dL    Comment: Glucose reference range applies only to samples taken after fasting for at least 8 hours.  Basic Metabolic Panel (BMET)     Status: Abnormal   Collection Time: 01/10/23  3:37 PM  Result Value Ref Range   Sodium 137 135 - 145 mEq/L   Potassium 5.1 3.5 - 5.1 mEq/L   Chloride 106 96 - 112 mEq/L   CO2 25 19 - 32 mEq/L   Glucose, Bld 170 (H) 70 - 99 mg/dL   BUN 49 (H) 6 - 23 mg/dL   Creatinine, Ser 1.61 (H) 0.40 - 1.20 mg/dL   GFR 09.60 (L) >45.40 mL/min    Comment: Calculated using the CKD-EPI Creatinine Equation (2021)   Calcium 8.1 (L) 8.4 - 10.5 mg/dL  CBC w/Diff     Status: Abnormal   Collection Time: 01/10/23  3:37 PM  Result Value Ref Range   WBC 13.4 (H) 4.0 - 10.5 K/uL   RBC 3.43 (L) 3.87 - 5.11 Mil/uL   Hemoglobin 9.8 (L) 12.0 - 15.0 g/dL   HCT 98.1 (L) 19.1 - 47.8 %   MCV 89.5 78.0 - 100.0 fl   MCHC 31.7 30.0 - 36.0 g/dL   RDW 29.5 62.1 - 30.8 %   Platelets 229.0 150.0 - 400.0 K/uL   Neutrophils Relative % 78.4 (H) 43.0 - 77.0 %   Lymphocytes Relative 14.0 12.0 - 46.0 %   Monocytes Relative 5.7 3.0 - 12.0 %   Eosinophils Relative 1.5 0.0 - 5.0 %   Basophils Relative 0.4 0.0 - 3.0 %   Neutro Abs 10.5 (H) 1.4 - 7.7 K/uL   Lymphs Abs 1.9 0.7 - 4.0 K/uL    Monocytes Absolute 0.8 0.1 - 1.0 K/uL   Eosinophils Absolute 0.2 0.0 - 0.7 K/uL   Basophils Absolute 0.1 0.0 - 0.1 K/uL        Garner Nash, MD, MS

## 2023-02-22 NOTE — Patient Instructions (Signed)
VISIT SUMMARY:  During your recent visit, we discussed your ongoing issues with postnasal drip and neuropathy. You reported some improvement in your overall health, but you continue to experience symptoms of a persistent cold and neuropathy. We discussed your current medications and made some adjustments to better manage your symptoms.  YOUR PLAN:  -POSTNASAL DRIP: This is a condition where excess mucus builds up in the back of your nose and throat, causing a feeling of mucus dripping down the back of your throat. We will continue with your current treatment of Flonase and azelastine nasal sprays to help reduce this.  -DIABETIC NEUROPATHY: This is a type of nerve damage that can occur if you have diabetes. High blood sugar can injure nerves throughout your body. We will increase your Amitriptyline dose to 75mg  at bedtime for better pain control and will refill your gabapentin ointment for as-needed use.  INSTRUCTIONS:  Please return in 1 month if your pain is not improving with the increased Amitriptyline dose. If your symptoms are well-managed, return in 3 months for a routine follow-up.

## 2023-02-23 NOTE — Assessment & Plan Note (Signed)
Pain managed effectively with topical gabapentin ointment but limited by cost.   Plan: Refill gabapentin ointment, and advise usage as needed. Amitriptyline increased to 75 mg nightly.

## 2023-02-23 NOTE — Assessment & Plan Note (Addendum)
Currently stable no recent panic anxiety episodes.  Plan: Continue monitoring, no immediate changes.

## 2023-02-23 NOTE — Assessment & Plan Note (Signed)
Improved.  Continue azelastine and fluticasone.  Monitor symptom progression

## 2023-02-25 DIAGNOSIS — J449 Chronic obstructive pulmonary disease, unspecified: Secondary | ICD-10-CM | POA: Diagnosis not present

## 2023-03-02 ENCOUNTER — Other Ambulatory Visit: Payer: Self-pay

## 2023-03-02 DIAGNOSIS — Z170 Estrogen receptor positive status [ER+]: Secondary | ICD-10-CM

## 2023-03-04 ENCOUNTER — Encounter: Payer: Self-pay | Admitting: Cardiovascular Disease

## 2023-03-04 NOTE — Progress Notes (Unsigned)
Cardiology Office Note:  .   Date:  03/05/2023  ID:  [name], DOB [birth year], MRN [id number] PCP: Nichole Gunner, MD  [name] Providers Cardiologist:  None {   History of Present Illness: . Oct. 28, 2024   ERIAN [name] is a 88 y.o. female previous patient of Dr. Shari Prows I am meeting her for the first time today   Seen with grandson, [name]   Hx of dementia, CAD  (BMS to RCA 2004, DESx2 to mid and proximal RCA 2012, recent NSTEMI 02/2020 s/p DES to RCA) )  HLD COPD CKD History of DVT/pulmonary embolus  Is wheelchair bound due to knee issues.  No Cp , no dyspnea     ROS:   Studies Reviewed: .         Risk Assessment/Calculations:             Physical Exam:   VS:  BP 114/60   Pulse 79   Ht 5\' 7"  (1.702 m)   Wt 268 lb (121.6 kg)   SpO2 97%   BMI 41.97 kg/m    Wt Readings from Last 3 Encounters:  03/05/23 268 lb (121.6 kg)  02/22/23 270 lb 6.4 oz (122.7 kg)  01/31/23 273 lb (123.8 kg)    GEN: Well nourished, well developed in no acute distress NECK: No JVD; No carotid bruits CARDIAC: RRR, no murmurs, rubs, gallops RESPIRATORY:  Clear to auscultation without rales, wheezing or rhonchi  ABDOMEN: Soft, non-tender, non-distended EXTREMITIES:  No edema; No deformity   ASSESSMENT AND PLAN: .    1.  Coronary artery disease: Still is not having any episodes of chest pain.  She is mostly wheelchair-bound at this point.  2.  Hyperlipidemia: Lipids were well-controlled when last checked in 2022.  Will recheck lipids, ALT, basic metabolic profile  3.  History of DVT/PE.[name]  Continue Eliquis.       Dispo: 1 year   Signed, Kristeen Miss, MD

## 2023-03-05 ENCOUNTER — Ambulatory Visit: Payer: PPO | Attending: Cardiology | Admitting: Cardiovascular Disease

## 2023-03-05 VITALS — BP 114/60 | HR 79 | Ht 67.0 in | Wt 268.0 lb

## 2023-03-05 DIAGNOSIS — I2510 Atherosclerotic heart disease of native coronary artery without angina pectoris: Secondary | ICD-10-CM | POA: Diagnosis not present

## 2023-03-05 DIAGNOSIS — E782 Mixed hyperlipidemia: Secondary | ICD-10-CM

## 2023-03-05 NOTE — Patient Instructions (Signed)
Lab Work: Lipids, ALT, BMET today If you have labs (blood work) drawn today and your tests are completely normal, you will receive your results only by: MyChart Message (if you have MyChart) OR A paper copy in the mail If you have any lab test that is abnormal or we need to change your treatment, we will call you to review the results.  Follow-Up: At [facility], you and your health needs are our priority.  As part of our continuing mission to provide you with exceptional heart care, we have created designated Provider Care Teams.  These Care Teams include your primary Cardiologist (physician) and Advanced Practice Providers (APPs -  Physician Assistants and Nurse Practitioners) who all work together to provide you with the care you need, when you need it.  Your next appointment:   1 year(s)  Provider:   Kristeen Miss, MD

## 2023-03-06 ENCOUNTER — Inpatient Hospital Stay: Payer: PPO | Attending: Hematology

## 2023-03-06 ENCOUNTER — Inpatient Hospital Stay: Payer: PPO | Admitting: Hematology

## 2023-03-06 VITALS — BP 127/76 | HR 69 | Temp 97.2°F | Resp 16

## 2023-03-06 DIAGNOSIS — I2510 Atherosclerotic heart disease of native coronary artery without angina pectoris: Secondary | ICD-10-CM | POA: Insufficient documentation

## 2023-03-06 DIAGNOSIS — D509 Iron deficiency anemia, unspecified: Secondary | ICD-10-CM

## 2023-03-06 DIAGNOSIS — I130 Hypertensive heart and chronic kidney disease with heart failure and stage 1 through stage 4 chronic kidney disease, or unspecified chronic kidney disease: Secondary | ICD-10-CM | POA: Insufficient documentation

## 2023-03-06 DIAGNOSIS — Z79899 Other long term (current) drug therapy: Secondary | ICD-10-CM | POA: Insufficient documentation

## 2023-03-06 DIAGNOSIS — Z170 Estrogen receptor positive status [ER+]: Secondary | ICD-10-CM | POA: Diagnosis not present

## 2023-03-06 DIAGNOSIS — C50812 Malignant neoplasm of overlapping sites of left female breast: Secondary | ICD-10-CM | POA: Diagnosis not present

## 2023-03-06 DIAGNOSIS — E1122 Type 2 diabetes mellitus with diabetic chronic kidney disease: Secondary | ICD-10-CM | POA: Insufficient documentation

## 2023-03-06 DIAGNOSIS — E559 Vitamin D deficiency, unspecified: Secondary | ICD-10-CM | POA: Diagnosis not present

## 2023-03-06 DIAGNOSIS — D631 Anemia in chronic kidney disease: Secondary | ICD-10-CM | POA: Insufficient documentation

## 2023-03-06 DIAGNOSIS — N183 Chronic kidney disease, stage 3 unspecified: Secondary | ICD-10-CM | POA: Diagnosis not present

## 2023-03-06 DIAGNOSIS — Z79811 Long term (current) use of aromatase inhibitors: Secondary | ICD-10-CM | POA: Diagnosis not present

## 2023-03-06 DIAGNOSIS — C50912 Malignant neoplasm of unspecified site of left female breast: Secondary | ICD-10-CM | POA: Diagnosis not present

## 2023-03-06 LAB — CMP (CANCER CENTER ONLY)
ALT: 12 U/L (ref 0–44)
AST: 14 U/L — ABNORMAL LOW (ref 15–41)
Albumin: 3.4 g/dL — ABNORMAL LOW (ref 3.5–5.0)
Alkaline Phosphatase: 80 U/L (ref 38–126)
Anion gap: 6 (ref 5–15)
BUN: 63 mg/dL — ABNORMAL HIGH (ref 8–23)
CO2: 25 mmol/L (ref 22–32)
Calcium: 8.7 mg/dL — ABNORMAL LOW (ref 8.9–10.3)
Chloride: 106 mmol/L (ref 98–111)
Creatinine: 2.35 mg/dL — ABNORMAL HIGH (ref 0.44–1.00)
GFR, Estimated: 20 mL/min — ABNORMAL LOW (ref >60)
Glucose, Bld: 155 mg/dL — ABNORMAL HIGH (ref 70–99)
Potassium: 5.5 mmol/L — ABNORMAL HIGH (ref 3.5–5.1)
Sodium: 137 mmol/L (ref 135–145)
Total Bilirubin: 0.4 mg/dL (ref 0.3–1.2)
Total Protein: 7.4 g/dL (ref 6.5–8.1)

## 2023-03-06 LAB — BASIC METABOLIC PANEL
BUN/Creatinine Ratio: 24 (ref 12–28)
BUN: 59 mg/dL — ABNORMAL HIGH (ref 8–27)
CO2: 21 mmol/L (ref 20–29)
Calcium: 8.6 mg/dL — ABNORMAL LOW (ref 8.7–10.3)
Chloride: 105 mmol/L (ref 96–106)
Creatinine, Ser: 2.5 mg/dL — ABNORMAL HIGH (ref 0.57–1.00)
Glucose: 118 mg/dL — ABNORMAL HIGH (ref 70–99)
Potassium: 5.6 mmol/L — ABNORMAL HIGH (ref 3.5–5.2)
Sodium: 141 mmol/L (ref 134–144)
eGFR: 18 mL/min/{1.73_m2} — ABNORMAL LOW (ref >59)

## 2023-03-06 LAB — LIPID PANEL
Chol/HDL Ratio: 2.6 ratio (ref 0.0–4.4)
Cholesterol, Total: 81 mg/dL — ABNORMAL LOW (ref 100–199)
HDL: 31 mg/dL — ABNORMAL LOW (ref >39)
LDL Chol Calc (NIH): 34 mg/dL (ref 0–99)
Triglycerides: 74 mg/dL (ref 0–149)
VLDL Cholesterol Cal: 16 mg/dL (ref 5–40)

## 2023-03-06 LAB — CBC WITH DIFFERENTIAL (CANCER CENTER ONLY)
Abs Immature Granulocytes: 0.03 10*3/uL (ref 0.00–0.07)
Basophils Absolute: 0 10*3/uL (ref 0.0–0.1)
Basophils Relative: 0 %
Eosinophils Absolute: 0.3 10*3/uL (ref 0.0–0.5)
Eosinophils Relative: 3 %
HCT: 27.6 % — ABNORMAL LOW (ref 36.0–46.0)
Hemoglobin: 8.5 g/dL — ABNORMAL LOW (ref 12.0–15.0)
Immature Granulocytes: 0 %
Lymphocytes Relative: 22 %
Lymphs Abs: 2.1 10*3/uL (ref 0.7–4.0)
MCH: 28.8 pg (ref 26.0–34.0)
MCHC: 30.8 g/dL (ref 30.0–36.0)
MCV: 93.6 fL (ref 80.0–100.0)
Monocytes Absolute: 0.5 10*3/uL (ref 0.1–1.0)
Monocytes Relative: 5 %
Neutro Abs: 6.4 10*3/uL (ref 1.7–7.7)
Neutrophils Relative %: 70 %
Platelet Count: 223 10*3/uL (ref 150–400)
RBC: 2.95 MIL/uL — ABNORMAL LOW (ref 3.87–5.11)
RDW: 16.1 % — ABNORMAL HIGH (ref 11.5–15.5)
WBC Count: 9.3 10*3/uL (ref 4.0–10.5)
nRBC: 0 % (ref 0.0–0.2)

## 2023-03-06 LAB — IRON AND IRON BINDING CAPACITY (CC-WL,HP ONLY)
Iron: 41 ug/dL (ref 28–170)
Saturation Ratios: 19 % (ref 10.4–31.8)
TIBC: 217 ug/dL — ABNORMAL LOW (ref 250–450)
UIBC: 176 ug/dL (ref 148–442)

## 2023-03-06 LAB — FERRITIN: Ferritin: 207 ng/mL (ref 11–307)

## 2023-03-06 LAB — ALT: ALT: 14 [IU]/L (ref 0–32)

## 2023-03-07 ENCOUNTER — Telehealth: Payer: Self-pay

## 2023-03-07 DIAGNOSIS — E875 Hyperkalemia: Secondary | ICD-10-CM

## 2023-03-07 NOTE — Telephone Encounter (Signed)
Called and spoke with patient. Reviewed high potassium foods and counseled to cut down. BMET ordered and states she will come sometime between 11/4-11/8 to have drawn.

## 2023-03-07 NOTE — Telephone Encounter (Signed)
-----   Message from Kristeen Miss sent at 03/06/2023  9:17 AM EDT ----- Lipids look great ,  LDL is 34 Creatinine remains mildly elevated ( stable )  Have her reduce her intake of foods that are high in potassium  Recheck in 1 week

## 2023-03-08 ENCOUNTER — Ambulatory Visit (INDEPENDENT_AMBULATORY_CARE_PROVIDER_SITE_OTHER): Payer: PPO | Admitting: Podiatry

## 2023-03-08 ENCOUNTER — Encounter: Payer: Self-pay | Admitting: Podiatry

## 2023-03-08 DIAGNOSIS — E1169 Type 2 diabetes mellitus with other specified complication: Secondary | ICD-10-CM

## 2023-03-08 DIAGNOSIS — M21969 Unspecified acquired deformity of unspecified lower leg: Secondary | ICD-10-CM | POA: Diagnosis not present

## 2023-03-08 DIAGNOSIS — M79609 Pain in unspecified limb: Secondary | ICD-10-CM | POA: Diagnosis not present

## 2023-03-08 DIAGNOSIS — B351 Tinea unguium: Secondary | ICD-10-CM

## 2023-03-08 NOTE — Progress Notes (Signed)
This patient returns to my office for at risk foot care.  This patient requires this care by a professional since this patient will be at risk due to having diabetes and CKD. She presents to the office  in a wheelchair with her grandson. This patient is unable to cut nails herself since the patient cannot reach her nails.These nails are painful walking and wearing shoes.  This patient presents for at risk foot care today.  General Appearance  Alert, conversant and in no acute stress.  Vascular  Dorsalis pedis and posterior tibial  pulses are  weakly palpable  bilaterally.  Capillary return is within normal limits  bilaterally. Temperature is within normal limits  bilaterally.  Neurologic  Senn-Weinstein monofilament wire test within normal limits  bilaterally. Muscle power within normal limits bilaterally.  Nails Thick disfigured discolored nails with subungual debris  from hallux to fifth toes bilaterally. No evidence of bacterial infection or drainage bilaterally.  Orthopedic  No limitations of motion  feet .  No crepitus or effusions noted.  No bony pathology or digital deformities noted. Deviation of toes 2-5 right foot at the MPJ.  Skin  normotropic skin with no porokeratosis noted bilaterally.  No signs of infections or ulcers noted.     Onychomycosis  Pain in right toes  Pain in left toes  Consent was obtained for treatment procedures.   Mechanical debridement of nails 1-5  bilaterally performed with a nail nipper.  Filed with dremel without incident.    Return office visit  12  weeks                  Told patient to return for periodic foot care and evaluation due to potential at risk complications.    [name] DPM

## 2023-03-09 ENCOUNTER — Telehealth: Payer: Self-pay | Admitting: Family Medicine

## 2023-03-09 DIAGNOSIS — E875 Hyperkalemia: Secondary | ICD-10-CM

## 2023-03-09 MED ORDER — SODIUM POLYSTYRENE SULFONATE 15 GM/60ML PO SUSP: ORAL | 0 refills | Status: DC

## 2023-03-09 NOTE — Telephone Encounter (Signed)
Chart supports rx. Last OV: 02/22/2023

## 2023-03-09 NOTE — Telephone Encounter (Signed)
Caller Name: Misty Stanley with [facility] Call back phone #: [phone]   MEDICATION(S):  sodium polystyrene (SPS) 15 GM/60ML suspension   Pt is out and no refills. Misty Stanley said pts daughter asked her to contact our office.  Has the patient contacted their pharmacy (YES/NO)? Yes  no refills What did pharmacy advise?

## 2023-03-12 ENCOUNTER — Encounter (HOSPITAL_COMMUNITY): Payer: Self-pay

## 2023-03-12 ENCOUNTER — Encounter: Payer: Self-pay | Admitting: Hematology

## 2023-03-12 NOTE — Progress Notes (Signed)
Hematology/Oncology Clinic Followup Note  Date of service.03/06/2023   Patient Care Team: [name], MD as PCP - General (Family Medicine) Daleen Squibb, Jesse Sans, MD (Inactive) as Consulting Physician (Cardiology) Ernesto Rutherford, MD as Consulting Physician (Ophthalmology) Abigail Miyamoto, MD as Consulting Physician (General Surgery) Miguel Aschoff, MD (Inactive) as Referring Physician (Obstetrics and Gynecology) Luciana Axe Alford Highland, MD as Consulting Physician (Ophthalmology) [facility], Konrad Dolores, MD as Consulting Physician (Endocrinology) [name], MD as Consulting Physician (Hematology) Drema Dallas, DO as Consulting Physician (Neurology) Meriam Sprague, MD (Inactive) as Consulting Physician (Cardiology)  CHIEF COMPLAINTS/PURPOSE OF CONSULTATION:  Follow-up for continued evaluation and management of breast cancer  DIAGNOSIS: left-sided presumed stage IA  (pT1c,pNx[cN0], cM0) invasive ductal carcinoma grade 2 out of 3, strongly ER +100%, strongly PR +100% and HER-2/neu negative. Given her high risk cardiac status lumpectomy had to be done under local anesthesia and sentinel lymph node biopsy was not possible. She has accompanying DCIS of intermediate grade present at margins. ECOG performance status is 3. -Had a mammogram/tomosynthesis done on 07/29/2015 which shows residual suspicious calcification in the left breast postero-medial to the biopsy site. Has known residual DCIS.  No evidence of new malignancy.  No evidence of right breast malignancy.   CURRENT TREATMENT: Arimidex 1mg  po daily  HISTORY OF PRESENTING ILLNESS: -please see my initial consultation for details of initial presentation   INTERVAL HISTORY:  [name] is a 88 y.o. female here for follow-up of her breast cancer.  She had decided to stop her Arimidex and continues to be off of this. Notes no new breast symptoms. No other acute new focal symptoms.  Stable energy levels.  Notes that her  overall mood has been better and she is looking forward to a Thanksgiving and Christmas holidays and meeting all the family members. No new infection issues. No overt GI bleeding noted.[name]   MEDICAL HISTORY:  Past Medical History:  Diagnosis Date   Abdominal pain 01/26/2012   Acute deep vein thrombosis (DVT) of femoral vein of right lower extremity (HCC) 03/26/2022   Acute kidney injury superimposed on chronic kidney disease (HCC) 12/18/2014   Acute upper GI bleed 03/26/2022   Allergy    takes Mucinex daily as needed   Anemia, unspecified    Anxiety    takes Clonazepam daily as needed   Anxiety associated with depression 06/08/2017   Arthritis    Breast cancer (HCC)    Breast cancer screening 02/19/2014   Breast mass 10/27/2014   Chronic diastolic CHF (congestive heart failure) (HCC)    takes Furosemide daily   Chronic pain syndrome 02/06/2015   CKD (chronic kidney disease), stage III (HCC)    Coronary artery disease    a. s/p IMI 2004 tx with BMS to RCA;  b. s/p Promus DES to RCA 2/12 c. abnormal nuc 2016 -> cath with med rx. d. NSTEMI 02/2020  mv CAD with severe ISR in pRCA and m/dRCA lesion both treated with DES.   Debility 08/01/2012   Dementia (HCC)    Depression    takes Cymbalta daily   Diabetes mellitus    insulin daily   DOE (dyspnea on exertion) 06/24/2014   Dysphagia, unspecified(787.20) 07/31/2012   Fungal dermatitis 11/13/2007   Qualifier: Diagnosis of  By: Amador Cunas  MD, Janett Labella    GERD (gastroesophageal reflux disease)    takes Protonix daily   Gout, unspecified    Headache    occasionally   History of anemia due  to chronic kidney disease 03/26/2022   History of blood clots    History of deep venous thrombosis 01/27/2012   History of pulmonary embolus (PE) 04/22/2014   Hyperkalemia 07/26/2017   Hyperlipidemia    takes Pravastatin daily   Hypertension    Insomnia    Joint pain    Joint swelling    Morbid obesity (HCC)    Multiple benign nevi  11/15/2016   Myocardial infarction [facility]) 2004   Non-STEMI (non-ST elevated myocardial infarction) (HCC) 02/15/2020   Numbness    Obstructive sleep apnea    does not wear cpap   Osteoarthritis    Osteoarthritis    Osteoarthrosis, unspecified whether generalized or localized, lower leg    Pain in the chest 12/31/2013   Pain, chronic    Personal history of other diseases of the digestive system 12/03/2006   Qualifier: Diagnosis of  By: Amador Cunas  MD, Janett Labella    Polymyalgia rheumatica [facility])    Presence of stent in right coronary artery    Pulmonary emboli (HCC) 01/2012   felt to need lifelong anticoagulation but Xarelto dc in 02/2020 due to need for DAPT   Situational depression 06/04/2009   Qualifier: Diagnosis of  By: Amador Cunas  MD, Janett Labella    Syncope, vasovagal 07/04/2021   Type 2 diabetes mellitus with hyperlipidemia (HCC) 02/16/2022   Urinary incontinence    takes Linzess daily   Vaginitis and vulvovaginitis 06/23/2016   Vertigo    hx of;was taking Meclizine if needed   Wears dentures     SURGICAL HISTORY: Past Surgical History:  Procedure Laterality Date   ABDOMINAL HYSTERECTOMY     partial   APPENDECTOMY     blood clots/legs and lungs  2013   BREAST BIOPSY Left 07/22/2014   BREAST BIOPSY Left 02/10/2013   BREAST LUMPECTOMY Left 11/05/2014   BREAST LUMPECTOMY WITH RADIOACTIVE SEED LOCALIZATION Left 11/05/2014   Procedure: LEFT BREAST LUMPECTOMY WITH RADIOACTIVE SEED LOCALIZATION;  Surgeon: Abigail Miyamoto, MD;  Location: [facility] OR;  Service: General;  Laterality: Left;   CARDIAC CATHETERIZATION     COLONOSCOPY     CORONARY ANGIOPLASTY  2   CORONARY STENT INTERVENTION N/A 02/17/2020   Procedure: CORONARY STENT INTERVENTION;  Surgeon: Marykay Lex, MD;  Location: [facility] INVASIVE CV LAB;  Service: Cardiovascular;  Laterality: N/A;   ESOPHAGOGASTRODUODENOSCOPY (EGD) WITH PROPOFOL N/A 11/07/2016   Procedure: ESOPHAGOGASTRODUODENOSCOPY (EGD) WITH PROPOFOL;  Surgeon: Iva Boop, MD;  Location: [facility] ENDOSCOPY;  Service: Endoscopy;  Laterality: N/A;   ESOPHAGOGASTRODUODENOSCOPY (EGD) WITH PROPOFOL N/A 01/05/2023   Procedure: ESOPHAGOGASTRODUODENOSCOPY (EGD) WITH PROPOFOL;  Surgeon: Hilarie Fredrickson, MD;  Location: [facility] ENDOSCOPY;  Service: Gastroenterology;  Laterality: N/A;   EXCISION OF SKIN TAG Right 11/05/2014   Procedure: EXCISION OF RIGHT EYELID SKIN TAG;  Surgeon: Abigail Miyamoto, MD;  Location: [facility] OR;  Service: General;  Laterality: Right;   EYE SURGERY Bilateral    cataract    GASTRIC BYPASS  1977    reversed in 1979, [facility]   LEFT HEART CATH AND CORONARY ANGIOGRAPHY N/A 02/17/2020   Procedure: LEFT HEART CATH AND CORONARY ANGIOGRAPHY;  Surgeon: Marykay Lex, MD;  Location: [facility] INVASIVE CV LAB;  Service: Cardiovascular;  Laterality: N/A;   LEFT HEART CATHETERIZATION WITH CORONARY ANGIOGRAM N/A 06/29/2014   Procedure: LEFT HEART CATHETERIZATION WITH CORONARY ANGIOGRAM;  Surgeon: Lennette Bihari, MD;  Location: [facility] CATH LAB;  Service: Cardiovascular;  Laterality: N/A;   MEMBRANE PEEL Right 10/23/2018   Procedure:  MEMBRANE PEEL;  Surgeon: Edmon Crape, MD;  Location: [facility] OR;  Service: Ophthalmology;  Laterality: Right;   MI with stent placement  2004   PARS PLANA VITRECTOMY Right 10/23/2018   Procedure: PARS PLANA VITRECTOMY WITH 25 GAUGE;  Surgeon: Edmon Crape, MD;  Location: [facility] OR;  Service: Ophthalmology;  Laterality: Right;    SOCIAL HISTORY: Social History   Socioeconomic History   Marital status: Widowed    Spouse name: Not on file   Number of children: 3   Years of education: Not on file   Highest education level: High school graduate  Occupational History   Occupation: retired  Tobacco Use   Smoking status: Never    Passive exposure: Past   Smokeless tobacco: Never   Tobacco comments:    Both parents smoked, patient was exposed/ "Raised up in smoke, my whole life."  Vaping Use   Vaping status: Never Used  Substance and Sexual  Activity   Alcohol use: No    Alcohol/week: 0.0 standard drinks of alcohol   Drug use: No   Sexual activity: Not on file  Other Topics Concern   Not on file  Social History Narrative   Not on file   Social Determinants of Health   Financial Resource Strain: Low Risk  (06/05/2022)   Overall Financial Resource Strain (CARDIA)    Difficulty of Paying Living Expenses: Not hard at all  Food Insecurity: No Food Insecurity (01/09/2023)   Hunger Vital Sign    Worried About Running Out of Food in the Last Year: Never true    Ran Out of Food in the Last Year: Never true  Transportation Needs: No Transportation Needs (01/03/2023)   PRAPARE - Administrator, Civil Service (Medical): No    Lack of Transportation (Non-Medical): No  Physical Activity: Insufficiently Active (06/05/2022)   Exercise Vital Sign    Days of Exercise per Week: 3 days    Minutes of Exercise per Session: 10 min  Stress: Stress Concern Present (06/05/2022)   [employer] of Occupational Health - Occupational Stress Questionnaire    Feeling of Stress : Rather much  Social Connections: Moderately Isolated (04/20/2022)   Social Connection and Isolation Panel [NHANES]    Frequency of Communication with Friends and Family: Once a week    Frequency of Social Gatherings with Friends and Family: Once a week    Attends Religious Services: 1 to 4 times per year    Active Member of [employer] or Organizations: Yes    Attends Banker Meetings: 1 to 4 times per year    Marital Status: Widowed  Intimate Partner Violence: Not At Risk (01/03/2023)   Humiliation, Afraid, Rape, and Kick questionnaire    Fear of Current or Ex-Partner: No    Emotionally Abused: No    Physically Abused: No    Sexually Abused: No    FAMILY HISTORY: Family History  Problem Relation Age of Onset   Breast cancer Mother 28   Heart disease Mother    Throat cancer Father    Hypertension Father    Arthritis Father    Diabetes Father     Arthritis Sister    Obesity Sister    Diabetes Sister    Heart disease Cousin    Colon cancer Neg Hx    Stomach cancer Neg Hx    Esophageal cancer Neg Hx     ALLERGIES:  is allergic to sulfonamide derivatives, duloxetine, lokelma [sodium zirconium cyclosilicate], aricept Palma Holter  hcl], and tramadol.  MEDICATIONS:  Current Outpatient Medications  Medication Sig Dispense Refill   albuterol (PROAIR HFA) 108 (90 Base) MCG/ACT inhaler Inhale 1-2 puffs into the lungs every 6 (six) hours as needed for wheezing or shortness of breath. 6.7 g 1   amitriptyline (ELAVIL) 75 MG tablet Take 1 tablet (75 mg total) by mouth at bedtime. 90 tablet 0   amoxicillin-clavulanate (AUGMENTIN) 875-125 MG tablet Take 1 tablet by mouth 2 (two) times daily. 20 tablet 0   apixaban (ELIQUIS) 5 MG TABS tablet Take 1 tablet (5 mg total) by mouth 2 (two) times daily. 180 tablet 3   atorvastatin (LIPITOR) 80 MG tablet TAKE 1 TABLET BY MOUTH EVERY DAY (Patient taking differently: Take 80 mg by mouth at bedtime.) 90 tablet 3   azelastine (ASTELIN) 0.1 % nasal spray Place 2 sprays into both nostrils 2 (two) times daily. 30 mL 12   benzonatate (TESSALON) 100 MG capsule Take 1 capsule (100 mg total) by mouth 2 (two) times daily as needed for cough. 20 capsule 0   carvedilol (COREG) 12.5 MG tablet TAKE 1 TABLET BY MOUTH 2 TIMES DAILY (Patient taking differently: Take 12.5 mg by mouth 2 (two) times daily with a meal.) 180 tablet 1   cetirizine (ZYRTEC) 10 MG tablet Take 10 mg by mouth daily as needed for allergies or rhinitis.     Continuous Glucose Sensor (FREESTYLE LIBRE 14 DAY SENSOR) MISC PLACE 1 DEVICE ON THE SKIN AS DIRECTED EVERY 14 DAYS (Patient taking differently: Inject 1 Device into the skin every 14 (fourteen) days.) 6 each 2   diclofenac Sodium (VOLTAREN) 1 % GEL Apply 2 g topically 4 (four) times daily. (Patient taking differently: Apply 2 g topically 4 (four) times daily as needed (pain).) 150 g 2   docusate  sodium (COLACE) 100 MG capsule Take 2 capsules (200 mg total) by mouth daily. 180 capsule 3   Elastic Bandages & Supports (B & B CARPAL TUNNEL BRACE) MISC Use brace on wrist as needed for wrist pain 2 each 10   febuxostat (ULORIC) 40 MG tablet TAKE 1 TABLET BY MOUTH EVERY DAY 90 tablet 3   fluticasone (FLONASE) 50 MCG/ACT nasal spray Place 2 sprays into both nostrils daily for 14 days. (Patient taking differently: Place 2 sprays into both nostrils every other day.) 1 g 0   Fluticasone-Umeclidin-Vilant (TRELEGY ELLIPTA) 100-62.5-25 MCG/ACT AEPB Inhale 1 puff into the lungs daily. 1 each 11   folic acid (FOLVITE) 1 MG tablet TAKE 1 TABLET BY MOUTH EVERY DAY (Patient taking differently: Take 1 mg by mouth daily.) 90 tablet 1   Gabapentin 10 % CREA Apply 1 Application topically 3 (three) times daily as needed (neuropathy). 90 g 1   glucagon 1 MG injection Inject 1 mg into the vein once as needed for up to 1 dose. 1 each 12   glucose 4 GM chewable tablet Chew 1 tablet (4 g total) by mouth as needed for low blood sugar. 50 tablet 12   hydrOXYzine (ATARAX) 25 MG tablet Take 1 tablet (25 mg total) by mouth every 8 (eight) hours as needed for anxiety or itching (nasal and throat congetion). 120 tablet 0   insulin glargine, 2 Unit Dial, (TOUJEO MAX SOLOSTAR) 300 UNIT/ML Solostar Pen Inject 40 Units into the skin daily. Patient assistance program provides. Patient reports 40 units 04/24/2022 (Patient taking differently: Inject 34 Units into the skin daily before breakfast.) 12 mL 1   ipratropium-albuterol (DUONEB) 0.5-2.5 (3) MG/3ML SOLN Take  3 mLs by nebulization every 6 (six) hours as needed (cough, wheezing, or shortness of breath). 360 mL 0   linaclotide (LINZESS) 145 MCG CAPS capsule Patient take 1 capsule daily.  May add an additional capsule for breakthrough constipation associated with opioid use (Patient taking differently: Take 145 mcg by mouth every other day.) 180 capsule 1   meclizine (ANTIVERT) 25 MG  tablet Take 1-2 tablets (25-50 mg total) by mouth 2 (two) times daily as needed for dizziness. 90 tablet 0   memantine (NAMENDA) 5 MG tablet TAKE 2 TABLETS BY MOUTH EVERY MORNING and TAKE 1 TABLET BY MOUTH EVERY EVENING (Patient taking differently: Take 5-10 mg by mouth See admin instructions. Take 10 mg by mouth in the morning and 5 mg in the evening) 270 tablet 5   montelukast (SINGULAIR) 10 MG tablet Take 1 tablet (10 mg total) by mouth at bedtime. 30 tablet 3   nitroGLYCERIN (NITROSTAT) 0.4 MG SL tablet Take one tablet under the tongue every 5 minutes as needed for chest pain (Patient taking differently: Place 0.4 mg under the tongue every 5 (five) minutes as needed for chest pain.) 50 tablet 0   NOVOLOG FLEXPEN 100 UNIT/ML FlexPen Inject 14 Units into the skin 2 (two) times daily with a meal. Max daily dose 70 units 60 mL 0   nystatin cream (MYCOSTATIN) Apply 1 Application topically 2 (two) times daily. 30 g 3   ondansetron (ZOFRAN) 4 MG tablet Take 1 tablet (4 mg total) by mouth every 8 (eight) hours as needed for nausea or vomiting. 20 tablet 0   oxyCODONE-acetaminophen (PERCOCET) 7.5-325 MG tablet Take 1 tablet by mouth 2 (two) times daily as needed (for pain).     pantoprazole (PROTONIX) 20 MG tablet TAKE 1 TABLET BY MOUTH EVERY DAY (Patient taking differently: Take 20 mg by mouth daily before breakfast.) 30 tablet 5   Semaglutide,0.25 or 0.5MG /DOS, (OZEMPIC, 0.25 OR 0.5 MG/DOSE,) 2 MG/3ML SOPN Inject 0.5 mg into the skin once a week. (Patient taking differently: Inject 0.5 mg into the skin every Monday.) 3 mL 11   sodium polystyrene (SPS) 15 GM/60ML suspension TAKE 60 MLS BY MOUTH ONCE WEEKLY TO KEEP POTASSIUM DOWN 240 mL 0   torsemide (DEMADEX) 20 MG tablet TAKE 1 TABLET BY MOUTH ONCE DAILY - TAKE AN EXTRA DOES FOR WEIGHT GAIN of 3LBs IN 1 DAY OR 5LBS IN 1 WEEK Or for worsening swelling 30 tablet 0   TYLENOL 500 MG tablet Take 1,000 mg by mouth every 6 (six) hours as needed for mild pain or  headache.     TYLENOL PM EXTRA STRENGTH 500-25 MG TABS tablet Take 2 tablets by mouth at bedtime.     Vitamin D, Ergocalciferol, (DRISDOL) 1.25 MG (50000 UNIT) CAPS capsule TAKE 1 CAPSULE BY MOUTH ONCE WEEKLY 4 capsule 1   No current facility-administered medications for this visit.    REVIEW OF SYSTEMS:    10 Point review of Systems was done is negative except as noted above.   PHYSICAL EXAMINATION:  ECOG PERFORMANCE STATUS:3  Vitals:   03/06/23 1234  BP: 127/76  Pulse: 69  Resp: 16  Temp: (!) 97.2 F (36.2 C)  SpO2: 100%   NAD GENERAL:alert, in no acute distress and comfortable SKIN: no acute rashes, no significant lesions EYES: conjunctiva are pink and non-injected, sclera anicteric OROPHARYNX: MMM, no exudates, no oropharyngeal erythema or ulceration NECK: supple, no JVD LYMPH:  no palpable lymphadenopathy in the cervical, axillary or inguinal regions LUNGS: clear  to auscultation b/l with normal respiratory effort HEART: regular rate & rhythm ABDOMEN:  normoactive bowel sounds , non tender, not distended. Extremity: no pedal edema PSYCH: alert & oriented x 3 with fluent speech NEURO: no focal motor/sensory deficits   LABORATORY DATA:  I have reviewed the data as listed  .    Latest Ref Rng & Units 03/06/2023   12:16 PM 01/10/2023    3:37 PM 01/06/2023    4:36 AM  CBC  WBC 4.0 - 10.5 K/uL 9.3  13.4  9.7   Hemoglobin 12.0 - 15.0 g/dL 8.5  9.8  8.5   Hematocrit 36.0 - 46.0 % 27.6  30.8  27.5   Platelets 150 - 400 K/uL 223  229.0  181    . CBC    Component Value Date/Time   WBC 9.3 03/06/2023 1216   WBC 13.4 (H) 01/10/2023 1537   RBC 2.95 (L) 03/06/2023 1216   HGB 8.5 (L) 03/06/2023 1216   HGB 9.3 (L) 03/02/2020 1517   HGB 8.7 (L) 03/21/2017 1351   HCT 27.6 (L) 03/06/2023 1216   HCT 29.5 (L) 03/02/2020 1517   HCT 28.9 (L) 03/21/2017 1351   PLT 223 03/06/2023 1216   PLT 254 03/02/2020 1517   MCV 93.6 03/06/2023 1216   MCV 88 03/02/2020 1517   MCV 84.3  03/21/2017 1351   MCH 28.8 03/06/2023 1216   MCHC 30.8 03/06/2023 1216   RDW 16.1 (H) 03/06/2023 1216   RDW 13.1 03/02/2020 1517   RDW 15.5 (H) 03/21/2017 1351   LYMPHSABS 2.1 03/06/2023 1216   LYMPHSABS 2.4 03/21/2017 1351   MONOABS 0.5 03/06/2023 1216   MONOABS 0.7 03/21/2017 1351   EOSABS 0.3 03/06/2023 1216   EOSABS 0.2 03/21/2017 1351   BASOSABS 0.0 03/06/2023 1216   BASOSABS 0.0 03/21/2017 1351   .    Latest Ref Rng & Units 03/06/2023   12:16 PM 03/05/2023   12:36 PM 01/10/2023    3:37 PM  CMP  Glucose 70 - 99 mg/dL 010  272  536   BUN 8 - 23 mg/dL 63  59  49   Creatinine 0.44 - 1.00 mg/dL 6.44  0.34  7.42   Sodium 135 - 145 mmol/L 137  141  137   Potassium 3.5 - 5.1 mmol/L 5.5  5.6  5.1   Chloride 98 - 111 mmol/L 106  105  106   CO2 22 - 32 mmol/L 25  21  25    Calcium 8.9 - 10.3 mg/dL 8.7  8.6  8.1   Total Protein 6.5 - 8.1 g/dL 7.4     Total Bilirubin 0.3 - 1.2 mg/dL 0.4     Alkaline Phos 38 - 126 U/L 80     AST 15 - 41 U/L 14     ALT 0 - 44 U/L 12  14      . Lab Results  Component Value Date   IRON 41 03/06/2023   TIBC 217 (L) 03/06/2023   IRONPCTSAT 19 03/06/2023   (Iron and TIBC)  Lab Results  Component Value Date   FERRITIN 207 03/06/2023       RADIOGRAPHIC STUDIES: I have personally reviewed the radiological images as listed and agreed with the findings in the report.  No results found. Diagnostic mammogram b/l 11/01/17  IMPRESSION: No significant change in suspicious microcalcifications posterior to the lumpectomy site in the left breast compared to the mammogram of 2018. No suspicious findings in the right breast. Exclusion of some  posterior tissue due to patient decreased mobility.   RECOMMENDATION: Diagnostic mammogram is suggested in 1 year. (Code:[id number])  CLINICAL DATA:  Annual mammography. The patient had surgery for DCIS in the left breast in 2016. There were positive margins at surgery but the patient could not return for  additional surgery. Known suspicious calcifications remain. EXAM: 2D DIGITAL DIAGNOSTIC BILATERAL MAMMOGRAM WITH CAD AND ADJUNCT TOMO COMPARISON:  Previous exam(s). ACR Breast Density Category b: There are scattered areas of fibroglandular density. FINDINGS: The suspicious calcifications posterior to the left lumpectomy site are stable. No other interval changes or other suspicious findings. Mammographic images were processed with CAD. IMPRESSION: Continued suspicious calcifications posterior to the left lumpectomy site. No other changes. RECOMMENDATION: Continued annual mammography for surveillance. Continued oncologic follow up. I have discussed the findings and recommendations with the patient. Results were also provided in writing at the conclusion of the visit. If applicable, a reminder letter will be sent to the patient regarding the next appointment. BI-RADS CATEGORY  6: Known biopsy-proven malignancy.     Electronically Signed   By: Gerome Sam III M.D   On: 08/16/2016 15:21    ASSESSMENT & PLAN:   Mrs. Patient is a very wonderful 88 y.o. African-American female with multiple medical comorbidities as described above with   #1 Left-sided presumed stage IA  (pT1c,pNx[cN0], cM0) invasive ductal carcinoma grade 2 out of 3, strongly ER +100%, strongly PR +100% and HER-2/neu negative. Given her high risk cardiac status lumpectomy had to be done under local anesthesia and sentinel lymph node biopsy was not possible. She has accompanying DCIS of intermediate grade present at margins. ECOG performance status is 3. Dexa scan "low bone mass" per WHO. -Bone density scan on 03/12/2017 with results showing: T-score of 0.5 at left forearm. Lumbar spine and dual femurs not completed due to patient in a wheelchair and not able to lay flat due to SOB while laying flat.   #2 significant coronary artery disease with stress test in February 2016 suggesting likely multivessel disease. Has  uncontrolled diabetes, hypertension, dyslipidemia, untreated sleep apnea, coronary disease, severe arthritis all of which are significantly limiting her quality of life.  #3 vitamin D deficiency - 25OH vitamin D level of 10, s/p high dose ergocalciferol re-placement with 25OH Vit D improvement to 30.9 in 10/2017. Now on vit D 2000 units daily.   #4  Anemia- anemia of chronic disease from CKD + ? Slow GI losses . Multiple metabolic insults in the bone marrow including uncontrolled diabetes  Now with more reapid drop due to concerns for active GI melena and hematuria.  PLAN:  -Discussed lab results on 03/06/2023 in detail with patient.  -mammogram on 07/07/2022 showed no mammographic evidence of malignancy  -Patient continues to be off Arimidex based on her choice and continues to have no acute new symptoms. No acute evidence of GI bleeding. CBC shows anemia with a hemoglobin of 8.5 with normal WBC count and platelets.  Anemia is multifactorial from hypertension, diabetes, chronic kidney disease. Ferritin 207 with iron saturation of 19%. No indication for additional IV iron at this time Will see the patient back with labs in 3 to 4 months  FOLLOW-UP: RTC with Dr Candise Che with labs in 4 months  The total time spent in the appointment was 23 minutes*.  All of the patient's questions were answered with apparent satisfaction. The patient knows to call the clinic with any problems, questions or concerns.   Wyvonnia Lora MD MS AAHIVMS [facility]  Hematology/Oncology Physician [facility]  .*Total Encounter Time as defined by the Centers for Medicare and Medicaid Services includes, in addition to the face-to-face time of a patient visit (documented in the note above) non-face-to-face time: obtaining and reviewing outside history, ordering and reviewing medications, tests or procedures, care coordination (communications with other health care professionals or caregivers) and documentation in the  medical record.

## 2023-03-13 DIAGNOSIS — E875 Hyperkalemia: Secondary | ICD-10-CM | POA: Diagnosis not present

## 2023-03-13 LAB — BASIC METABOLIC PANEL
BUN/Creatinine Ratio: 27 (ref 12–28)
BUN: 57 mg/dL — ABNORMAL HIGH (ref 8–27)
CO2: 25 mmol/L (ref 20–29)
Calcium: 8.7 mg/dL (ref 8.7–10.3)
Chloride: 105 mmol/L (ref 96–106)
Creatinine, Ser: 2.14 mg/dL — ABNORMAL HIGH (ref 0.57–1.00)
Glucose: 183 mg/dL — ABNORMAL HIGH (ref 70–99)
Potassium: 5 mmol/L (ref 3.5–5.2)
Sodium: 139 mmol/L (ref 134–144)
eGFR: 22 mL/min/{1.73_m2} — ABNORMAL LOW (ref >59)

## 2023-03-19 ENCOUNTER — Other Ambulatory Visit: Payer: PPO | Admitting: *Deleted

## 2023-03-19 ENCOUNTER — Ambulatory Visit: Payer: Self-pay

## 2023-03-19 NOTE — Patient Instructions (Signed)
Visit Information  Thank you for taking time to visit with me today. Please don't hesitate to contact me if I can be of assistance to you.   Following are the goals we discussed today:   Goals Addressed             This Visit's Progress    Diabetes Management       Patient Goals/Self Care Activities: -Patient/Caregiver will take medications as prescribed   -Patient/Caregiver will attend all scheduled provider appointments -Patient/Caregiver will call provider office for new concerns or questions   -Calls provider office for new concerns, questions, or BP outside discussed parameters -Follows a low sodium diet/DASH diet -check blood sugar at prescribed times -check blood sugar if I feel it is too high or too low -record values and write them down take them to all doctor visits    Spoke with daughter Elita Quick. She reports patient doing good.  Last blood sugar 98.  Discussed diabetes management.  No concerns.            Our next appointment is by telephone on 06/17/22 at 1030 am  Please call the care guide team at [phone] if you need to cancel or reschedule your appointment.   If you are experiencing a Mental Health or Behavioral Health Crisis or need someone to talk to, please call the Suicide and Crisis Lifeline: 988   Patient verbalizes understanding of instructions and care plan provided today and agrees to view in MyChart. Active MyChart status and patient understanding of how to access instructions and care plan via MyChart confirmed with patient.     The patient has been provided with contact information for the care management team and has been advised to call with any health related questions or concerns.   Bary Leriche, RN, MSN [facility], [facility] Management Community Coordinator Direct Dial: [phone]  Fax: [phone] Website: [email or web address]

## 2023-03-19 NOTE — Patient Outreach (Signed)
  Care Coordination   Follow Up Visit Note   03/19/2023 Name: VIANN BALASUBRAMANIAN MRN: [id number] DOB: 1934-09-19  [name] is a 88 y.o. year old female who sees [name], MD for primary care. I spoke with  [name] by phone today.  What matters to the patients health and wellness today?  Maintain diabetes    Goals Addressed             This Visit's Progress    Diabetes Management       Patient Goals/Self Care Activities: -Patient/Caregiver will take medications as prescribed   -Patient/Caregiver will attend all scheduled provider appointments -Patient/Caregiver will call provider office for new concerns or questions   -Calls provider office for new concerns, questions, or BP outside discussed parameters -Follows a low sodium diet/DASH diet -check blood sugar at prescribed times -check blood sugar if I feel it is too high or too low -record values and write them down take them to all doctor visits    Spoke with daughter Elita Quick. She reports patient doing good.  Last blood sugar 98.  Discussed diabetes management.  No concerns.            SDOH assessments and interventions completed:  Yes     Care Coordination Interventions:  Yes, provided   Follow up plan: Follow up call scheduled for February    Encounter Outcome:  Patient Visit Completed   Bary Leriche, RN, MSN [place]  [facility], [facility] Management Community Coordinator Direct Dial: [phone]  Fax: [phone] Website: [email or web address]

## 2023-03-21 ENCOUNTER — Encounter: Payer: Self-pay | Admitting: Internal Medicine

## 2023-03-21 ENCOUNTER — Ambulatory Visit (INDEPENDENT_AMBULATORY_CARE_PROVIDER_SITE_OTHER): Payer: PPO | Admitting: Internal Medicine

## 2023-03-21 VITALS — BP 144/72 | HR 82 | Temp 98.3°F | Ht 67.0 in | Wt 272.0 lb

## 2023-03-21 DIAGNOSIS — L304 Erythema intertrigo: Secondary | ICD-10-CM | POA: Diagnosis not present

## 2023-03-21 DIAGNOSIS — L853 Xerosis cutis: Secondary | ICD-10-CM | POA: Diagnosis not present

## 2023-03-21 DIAGNOSIS — L299 Pruritus, unspecified: Secondary | ICD-10-CM | POA: Diagnosis not present

## 2023-03-21 DIAGNOSIS — D229 Melanocytic nevi, unspecified: Secondary | ICD-10-CM | POA: Diagnosis not present

## 2023-03-21 MED ORDER — NYSTATIN 100000 UNIT/GM EX POWD: 1.0000 | Freq: Three times a day (TID) | CUTANEOUS | 3 refills | Status: DC

## 2023-03-21 NOTE — Progress Notes (Signed)
[facility] PRIMARY CARE [facility] [address] [place] [address] Dept: [phone] Dept Fax: [phone]  Acute Care Office Visit  Subjective:   [name] 1934-07-05 03/21/2023  Chief Complaint  Patient presents with   Breast Problem    Noticed over the weekend   facial itching    Notice recently   Blister    On palm of hand     HPI: Discussed the use of AI scribe software for clinical note transcription with the patient, who gave verbal consent to proceed.  History of Present Illness   The patient presents with a sore spot and possible bruising under her left breast, noticed over the weekend. She reports a long-standing knot in the same area, which she describes as being on the bone rather than in the breast tissue. The patient also reports itching under the breast.  In addition, the patient has noticed changes in her hands, with what she describes as blisters appearing over the past two to three weeks. These are located on her palm and have been described as puffy. The patient also reports a long-standing itching sensation on her face. Itching comes and goes but she states she has it all year long. No new creams/lotions/face products. Itching partially relieved with her Olay moisturizer. She requests a referral to dermatology to evaluate the moles on her face.         The following portions of the patient's history were reviewed and updated as appropriate: past medical history, past surgical history, family history, social history, allergies, medications, and problem list.   Patient Active Problem List   Diagnosis Date Noted   PND (post-nasal drip) 01/23/2023   Chronic ethmoidal sinusitis 01/11/2023   ABLA (acute blood loss anemia) 01/11/2023   Recurrent acute suppurative otitis media of right ear without spontaneous rupture of tympanic membrane 01/11/2023   Viral URI with cough 01/11/2023   Upper GI bleed 01/03/2023   CAD  (coronary artery disease) 01/03/2023   Epigastric abdominal tenderness 01/03/2023   Chills 12/19/2022   Concha bullosa 11/28/2022   Tinnitus of both ears 10/16/2022   Chronic left hip pain 06/13/2022   Dependent for medication administration 06/13/2022   Very limited skin mobility 05/29/2022   Swelling of right upper extremity 04/27/2022   Carpal tunnel syndrome on both sides 04/27/2022   Diabetic peripheral neuropathy associated with type 2 diabetes mellitus (HCC) 04/05/2022   COPD with acute exacerbation (HCC) 03/26/2022   GAD (generalized anxiety disorder) 03/26/2022   Allergic rhinitis 03/26/2022   Type 2 diabetes mellitus with stage 4 chronic kidney disease, with long-term current use of insulin (HCC) 02/16/2022   Type 2 diabetes mellitus with diabetic neuropathy, with long-term current use of insulin (HCC) 02/16/2022   Dermatosis papulosa nigra 02/09/2022   CKD (chronic kidney disease), stage IV (HCC) 02/09/2022   Pneumobilia 02/08/2022   Severe episode of recurrent major depressive disorder, without psychotic features (HCC) 12/05/2021   Breast cancer (HCC) 10/22/2021   Chronic pain of right hand 04/27/2021   Iron deficiency anemia 09/13/2020   Bilateral primary osteoarthritis of knee 04/12/2020   Chronic radicular lumbar pain 03/18/2020   Lumbar spondylosis 03/18/2020   Spinal stenosis, lumbar region, with neurogenic claudication 03/18/2020   Moderate nonproliferative diabetic retinopathy of both eyes without macular edema associated with type 2 diabetes mellitus (HCC) 08/27/2019   Type 2 diabetes mellitus with hyperlipidemia (HCC) 12/13/2017   GERD with esophagitis 11/15/2016   Dementia (HCC) 11/15/2016   Endometrial polyp  06/23/2016   Vitamin D deficiency 08/02/2015   Acute kidney injury superimposed on chronic kidney disease (HCC) 12/18/2014   Gout 10/27/2014   Vertigo 09/24/2014   Drug-induced constipation 05/10/2014   COPD with chronic bronchitis (HCC) 04/21/2014    Estrogen deficiency 02/19/2014   Insomnia 09/17/2012   Long term current use of anticoagulant therapy 02/02/2012   Chronic diastolic CHF (congestive heart failure) (HCC) 01/29/2012   History of DVT (deep vein thrombosis) 01/27/2012   Class 3 obesity 02/15/2009   Symptomatic anemia 02/15/2009   TENSION HEADACHE 01/07/2008   Hyperlipidemia 12/03/2006   Essential hypertension 12/03/2006   Past Medical History:  Diagnosis Date   Abdominal pain 01/26/2012   Acute deep vein thrombosis (DVT) of femoral vein of right lower extremity (HCC) 03/26/2022   Acute kidney injury superimposed on chronic kidney disease (HCC) 12/18/2014   Acute upper GI bleed 03/26/2022   Allergy    takes Mucinex daily as needed   Anemia, unspecified    Anxiety    takes Clonazepam daily as needed   Anxiety associated with depression 06/08/2017   Arthritis    Breast cancer (HCC)    Breast cancer screening 02/19/2014   Breast mass 10/27/2014   Chronic diastolic CHF (congestive heart failure) (HCC)    takes Furosemide daily   Chronic pain syndrome 02/06/2015   CKD (chronic kidney disease), stage III (HCC)    Coronary artery disease    a. s/p IMI 2004 tx with BMS to RCA;  b. s/p Promus DES to RCA 2/12 c. abnormal nuc 2016 -> cath with med rx. d. NSTEMI 02/2020  mv CAD with severe ISR in pRCA and m/dRCA lesion both treated with DES.   Debility 08/01/2012   Dementia (HCC)    Depression    takes Cymbalta daily   Diabetes mellitus    insulin daily   DOE (dyspnea on exertion) 06/24/2014   Dysphagia, unspecified(787.20) 07/31/2012   Fungal dermatitis 11/13/2007   Qualifier: Diagnosis of  By: Amador Cunas  MD, Janett Labella    GERD (gastroesophageal reflux disease)    takes Protonix daily   Gout, unspecified    Headache    occasionally   History of anemia due to chronic kidney disease 03/26/2022   History of blood clots    History of deep venous thrombosis 01/27/2012   History of pulmonary embolus (PE) 04/22/2014    Hyperkalemia 07/26/2017   Hyperlipidemia    takes Pravastatin daily   Hypertension    Insomnia    Joint pain    Joint swelling    Morbid obesity (HCC)    Multiple benign nevi 11/15/2016   Myocardial infarction (HCC) 2004   Non-STEMI (non-ST elevated myocardial infarction) (HCC) 02/15/2020   Numbness    Obstructive sleep apnea    does not wear cpap   Osteoarthritis    Osteoarthritis    Osteoarthrosis, unspecified whether generalized or localized, lower leg    Pain in the chest 12/31/2013   Pain, chronic    Personal history of other diseases of the digestive system 12/03/2006   Qualifier: Diagnosis of  By: Amador Cunas  MD, Janett Labella    Polymyalgia rheumatica [facility])    Presence of stent in right coronary artery    Pulmonary emboli (HCC) 01/2012   felt to need lifelong anticoagulation but Xarelto dc in 02/2020 due to need for DAPT   Situational depression 06/04/2009   Qualifier: Diagnosis of  By: Amador Cunas  MD, Janett Labella    Syncope, vasovagal 07/04/2021  Type 2 diabetes mellitus with hyperlipidemia (HCC) 02/16/2022   Urinary incontinence    takes Linzess daily   Vaginitis and vulvovaginitis 06/23/2016   Vertigo    hx of;was taking Meclizine if needed   Wears dentures    Past Surgical History:  Procedure Laterality Date   ABDOMINAL HYSTERECTOMY     partial   APPENDECTOMY     blood clots/legs and lungs  2013   BREAST BIOPSY Left 07/22/2014   BREAST BIOPSY Left 02/10/2013   BREAST LUMPECTOMY Left 11/05/2014   BREAST LUMPECTOMY WITH RADIOACTIVE SEED LOCALIZATION Left 11/05/2014   Procedure: LEFT BREAST LUMPECTOMY WITH RADIOACTIVE SEED LOCALIZATION;  Surgeon: Abigail Miyamoto, MD;  Location: [facility] OR;  Service: General;  Laterality: Left;   CARDIAC CATHETERIZATION     COLONOSCOPY     CORONARY ANGIOPLASTY  2   CORONARY STENT INTERVENTION N/A 02/17/2020   Procedure: CORONARY STENT INTERVENTION;  Surgeon: Marykay Lex, MD;  Location: [facility] INVASIVE CV LAB;  Service: Cardiovascular;   Laterality: N/A;   ESOPHAGOGASTRODUODENOSCOPY (EGD) WITH PROPOFOL N/A 11/07/2016   Procedure: ESOPHAGOGASTRODUODENOSCOPY (EGD) WITH PROPOFOL;  Surgeon: Iva Boop, MD;  Location: [facility] ENDOSCOPY;  Service: Endoscopy;  Laterality: N/A;   ESOPHAGOGASTRODUODENOSCOPY (EGD) WITH PROPOFOL N/A 01/05/2023   Procedure: ESOPHAGOGASTRODUODENOSCOPY (EGD) WITH PROPOFOL;  Surgeon: Hilarie Fredrickson, MD;  Location: [facility] ENDOSCOPY;  Service: Gastroenterology;  Laterality: N/A;   EXCISION OF SKIN TAG Right 11/05/2014   Procedure: EXCISION OF RIGHT EYELID SKIN TAG;  Surgeon: Abigail Miyamoto, MD;  Location: [facility] OR;  Service: General;  Laterality: Right;   EYE SURGERY Bilateral    cataract    GASTRIC BYPASS  1977    reversed in 1979, [facility]   LEFT HEART CATH AND CORONARY ANGIOGRAPHY N/A 02/17/2020   Procedure: LEFT HEART CATH AND CORONARY ANGIOGRAPHY;  Surgeon: Marykay Lex, MD;  Location: [facility] INVASIVE CV LAB;  Service: Cardiovascular;  Laterality: N/A;   LEFT HEART CATHETERIZATION WITH CORONARY ANGIOGRAM N/A 06/29/2014   Procedure: LEFT HEART CATHETERIZATION WITH CORONARY ANGIOGRAM;  Surgeon: Lennette Bihari, MD;  Location: [facility] CATH LAB;  Service: Cardiovascular;  Laterality: N/A;   MEMBRANE PEEL Right 10/23/2018   Procedure: MEMBRANE PEEL;  Surgeon: Edmon Crape, MD;  Location: [facility] OR;  Service: Ophthalmology;  Laterality: Right;   MI with stent placement  2004   PARS PLANA VITRECTOMY Right 10/23/2018   Procedure: PARS PLANA VITRECTOMY WITH 25 GAUGE;  Surgeon: Edmon Crape, MD;  Location: [facility] OR;  Service: Ophthalmology;  Laterality: Right;   Family History  Problem Relation Age of Onset   Breast cancer Mother 22   Heart disease Mother    Throat cancer Father    Hypertension Father    Arthritis Father    Diabetes Father    Arthritis Sister    Obesity Sister    Diabetes Sister    Heart disease Cousin    Colon cancer Neg Hx    Stomach cancer Neg Hx    Esophageal cancer Neg Hx     Current Outpatient  Medications:    albuterol (PROAIR HFA) 108 (90 Base) MCG/ACT inhaler, Inhale 1-2 puffs into the lungs every 6 (six) hours as needed for wheezing or shortness of breath., Disp: 6.7 g, Rfl: 1   amitriptyline (ELAVIL) 75 MG tablet, Take 1 tablet (75 mg total) by mouth at bedtime., Disp: 90 tablet, Rfl: 0   amoxicillin-clavulanate (AUGMENTIN) 875-125 MG tablet, Take 1 tablet by mouth 2 (two) times daily., Disp: 20 tablet, Rfl:  0   apixaban (ELIQUIS) 5 MG TABS tablet, Take 1 tablet (5 mg total) by mouth 2 (two) times daily., Disp: 180 tablet, Rfl: 3   atorvastatin (LIPITOR) 80 MG tablet, TAKE 1 TABLET BY MOUTH EVERY DAY (Patient taking differently: Take 80 mg by mouth at bedtime.), Disp: 90 tablet, Rfl: 3   azelastine (ASTELIN) 0.1 % nasal spray, Place 2 sprays into both nostrils 2 (two) times daily., Disp: 30 mL, Rfl: 12   benzonatate (TESSALON) 100 MG capsule, Take 1 capsule (100 mg total) by mouth 2 (two) times daily as needed for cough., Disp: 20 capsule, Rfl: 0   carvedilol (COREG) 12.5 MG tablet, TAKE 1 TABLET BY MOUTH 2 TIMES DAILY (Patient taking differently: Take 12.5 mg by mouth 2 (two) times daily with a meal.), Disp: 180 tablet, Rfl: 1   cetirizine (ZYRTEC) 10 MG tablet, Take 10 mg by mouth daily as needed for allergies or rhinitis., Disp: , Rfl:    Continuous Glucose Sensor (FREESTYLE LIBRE 14 DAY SENSOR) MISC, PLACE 1 DEVICE ON THE SKIN AS DIRECTED EVERY 14 DAYS (Patient taking differently: Inject 1 Device into the skin every 14 (fourteen) days.), Disp: 6 each, Rfl: 2   diclofenac Sodium (VOLTAREN) 1 % GEL, Apply 2 g topically 4 (four) times daily. (Patient taking differently: Apply 2 g topically 4 (four) times daily as needed (pain).), Disp: 150 g, Rfl: 2   docusate sodium (COLACE) 100 MG capsule, Take 2 capsules (200 mg total) by mouth daily., Disp: 180 capsule, Rfl: 3   Elastic Bandages & Supports (B & B CARPAL TUNNEL BRACE) MISC, Use brace on wrist as needed for wrist pain, Disp: 2 each,  Rfl: 10   febuxostat (ULORIC) 40 MG tablet, TAKE 1 TABLET BY MOUTH EVERY DAY, Disp: 90 tablet, Rfl: 3   Fluticasone-Umeclidin-Vilant (TRELEGY ELLIPTA) 100-62.5-25 MCG/ACT AEPB, Inhale 1 puff into the lungs daily., Disp: 1 each, Rfl: 11   folic acid (FOLVITE) 1 MG tablet, TAKE 1 TABLET BY MOUTH EVERY DAY (Patient taking differently: Take 1 mg by mouth daily.), Disp: 90 tablet, Rfl: 1   Gabapentin 10 % CREA, Apply 1 Application topically 3 (three) times daily as needed (neuropathy)., Disp: 90 g, Rfl: 1   glucagon 1 MG injection, Inject 1 mg into the vein once as needed for up to 1 dose., Disp: 1 each, Rfl: 12   glucose 4 GM chewable tablet, Chew 1 tablet (4 g total) by mouth as needed for low blood sugar., Disp: 50 tablet, Rfl: 12   hydrOXYzine (ATARAX) 25 MG tablet, Take 1 tablet (25 mg total) by mouth every 8 (eight) hours as needed for anxiety or itching (nasal and throat congetion)., Disp: 120 tablet, Rfl: 0   insulin glargine, 2 Unit Dial, (TOUJEO MAX SOLOSTAR) 300 UNIT/ML Solostar Pen, Inject 40 Units into the skin daily. Patient assistance program provides. Patient reports 40 units 04/24/2022 (Patient taking differently: Inject 34 Units into the skin daily before breakfast.), Disp: 12 mL, Rfl: 1   ipratropium-albuterol (DUONEB) 0.5-2.5 (3) MG/3ML SOLN, Take 3 mLs by nebulization every 6 (six) hours as needed (cough, wheezing, or shortness of breath)., Disp: 360 mL, Rfl: 0   linaclotide (LINZESS) 145 MCG CAPS capsule, Patient take 1 capsule daily.  May add an additional capsule for breakthrough constipation associated with opioid use (Patient taking differently: Take 145 mcg by mouth every other day.), Disp: 180 capsule, Rfl: 1   meclizine (ANTIVERT) 25 MG tablet, Take 1-2 tablets (25-50 mg total) by mouth 2 (two)  times daily as needed for dizziness., Disp: 90 tablet, Rfl: 0   memantine (NAMENDA) 5 MG tablet, TAKE 2 TABLETS BY MOUTH EVERY MORNING and TAKE 1 TABLET BY MOUTH EVERY EVENING (Patient  taking differently: Take 5-10 mg by mouth See admin instructions. Take 10 mg by mouth in the morning and 5 mg in the evening), Disp: 270 tablet, Rfl: 5   montelukast (SINGULAIR) 10 MG tablet, Take 1 tablet (10 mg total) by mouth at bedtime., Disp: 30 tablet, Rfl: 3   nitroGLYCERIN (NITROSTAT) 0.4 MG SL tablet, Take one tablet under the tongue every 5 minutes as needed for chest pain (Patient taking differently: Place 0.4 mg under the tongue every 5 (five) minutes as needed for chest pain.), Disp: 50 tablet, Rfl: 0   NOVOLOG FLEXPEN 100 UNIT/ML FlexPen, Inject 14 Units into the skin 2 (two) times daily with a meal. Max daily dose 70 units, Disp: 60 mL, Rfl: 0   nystatin (MYCOSTATIN/NYSTOP) powder, Apply 1 Application topically 3 (three) times daily., Disp: 60 g, Rfl: 3   ondansetron (ZOFRAN) 4 MG tablet, Take 1 tablet (4 mg total) by mouth every 8 (eight) hours as needed for nausea or vomiting., Disp: 20 tablet, Rfl: 0   oxyCODONE-acetaminophen (PERCOCET) 7.5-325 MG tablet, Take 1 tablet by mouth 2 (two) times daily as needed (for pain)., Disp: , Rfl:    pantoprazole (PROTONIX) 20 MG tablet, TAKE 1 TABLET BY MOUTH EVERY DAY (Patient taking differently: Take 20 mg by mouth daily before breakfast.), Disp: 30 tablet, Rfl: 5   Semaglutide,0.25 or 0.5MG /DOS, (OZEMPIC, 0.25 OR 0.5 MG/DOSE,) 2 MG/3ML SOPN, Inject 0.5 mg into the skin once a week. (Patient taking differently: Inject 0.5 mg into the skin every Monday.), Disp: 3 mL, Rfl: 11   sodium polystyrene (SPS) 15 GM/60ML suspension, TAKE 60 MLS BY MOUTH ONCE WEEKLY TO KEEP POTASSIUM DOWN, Disp: 240 mL, Rfl: 0   torsemide (DEMADEX) 20 MG tablet, TAKE 1 TABLET BY MOUTH ONCE DAILY - TAKE AN EXTRA DOES FOR WEIGHT GAIN of 3LBs IN 1 DAY OR 5LBS IN 1 WEEK Or for worsening swelling, Disp: 30 tablet, Rfl: 0   TYLENOL 500 MG tablet, Take 1,000 mg by mouth every 6 (six) hours as needed for mild pain or headache., Disp: , Rfl:    TYLENOL PM EXTRA STRENGTH 500-25 MG TABS  tablet, Take 2 tablets by mouth at bedtime., Disp: , Rfl:    Vitamin D, Ergocalciferol, (DRISDOL) 1.25 MG (50000 UNIT) CAPS capsule, TAKE 1 CAPSULE BY MOUTH ONCE WEEKLY, Disp: 4 capsule, Rfl: 1   fluticasone (FLONASE) 50 MCG/ACT nasal spray, Place 2 sprays into both nostrils daily for 14 days. (Patient taking differently: Place 2 sprays into both nostrils every other day.), Disp: 1 g, Rfl: 0 Allergies  Allergen Reactions   Sulfonamide Derivatives Swelling and Other (See Comments)    Mouth swelling- no resp issues noted   Duloxetine     Headaches   Lokelma [Sodium Zirconium Cyclosilicate] Other (See Comments)    Headache, stomach upset   Aricept [Donepezil Hcl] Diarrhea, Nausea And Vomiting and Other (See Comments)    GI upset/loose stools   Tramadol Nausea And Vomiting     ROS: A complete ROS was performed with pertinent positives/negatives noted in the HPI. The remainder of the ROS are negative.    Objective:   Today's Vitals   03/21/23 1046  BP: (!) 144/72  Pulse: 82  Temp: 98.3 F (36.8 C)  TempSrc: Temporal  SpO2: 98%  Weight:  272 lb (123.4 kg)  Height: 5\' 7"  (1.702 m)    GENERAL: Well-appearing, in NAD. Well nourished.  SKIN: Pink, warm and dry. Red-brown dry rash below left breast. Palmar fat pads to hands that patient reports as "blisters." No vesicles observed. Dry skin to face.   RESPIRATORY: Chest wall symmetrical. Respirations even and non-labored. PSYCH/MENTAL STATUS: Alert, oriented x 3. Cooperative, appropriate mood and affect.    No results found for any visits on 03/21/23.    Assessment & Plan:  Assessment and Plan    Intertrigo (Yeast infection under the breasts) Noted redness and itching under the breasts, likely due to a yeast infection caused by the warm, moist environment. -Prescribe Nystatin powder to clear up the yeast infection. -Recommend over-the-counter Fresh Breasts cream for daily use to help with moisture and prevent bacterial  buildup.  Dry Skin, Itching Long-standing issue with facial itching, partially relieved by Olay moisturizer. - recommend trying heavier moisturizer to face due to dry skin to help with itching.   Multiple pigmented nevi -Referral to Dermatology for evaluation of moles.      Meds ordered this encounter  Medications   nystatin (MYCOSTATIN/NYSTOP) powder    Sig: Apply 1 Application topically 3 (three) times daily.    Dispense:  60 g    Refill:  3    Order Specific Question:   Supervising Provider    Answer:   [name] [[id number]]   Orders Placed This Encounter  Procedures   Ambulatory referral to Dermatology    Referral Priority:   Routine    Referral Type:   Consultation    Referral Reason:   Specialty Services Required    Requested Specialty:   Dermatology    Number of Visits Requested:   1   Lab Orders  No laboratory test(s) ordered today   No images are attached to the encounter or orders placed in the encounter.  Return for Scheduled Routine Office Visits and as needed.   Salvatore Decent, FNP

## 2023-03-21 NOTE — Patient Instructions (Addendum)
Over the Counter: Fresh Breast Cream to Powder

## 2023-03-22 ENCOUNTER — Other Ambulatory Visit: Payer: Self-pay | Admitting: Family Medicine

## 2023-03-22 DIAGNOSIS — E1122 Type 2 diabetes mellitus with diabetic chronic kidney disease: Secondary | ICD-10-CM | POA: Diagnosis not present

## 2023-03-22 DIAGNOSIS — N2581 Secondary hyperparathyroidism of renal origin: Secondary | ICD-10-CM | POA: Diagnosis not present

## 2023-03-22 DIAGNOSIS — I5032 Chronic diastolic (congestive) heart failure: Secondary | ICD-10-CM

## 2023-03-22 DIAGNOSIS — N183 Chronic kidney disease, stage 3 unspecified: Secondary | ICD-10-CM | POA: Diagnosis not present

## 2023-03-22 DIAGNOSIS — I509 Heart failure, unspecified: Secondary | ICD-10-CM | POA: Diagnosis not present

## 2023-03-22 DIAGNOSIS — D631 Anemia in chronic kidney disease: Secondary | ICD-10-CM | POA: Diagnosis not present

## 2023-03-22 DIAGNOSIS — I129 Hypertensive chronic kidney disease with stage 1 through stage 4 chronic kidney disease, or unspecified chronic kidney disease: Secondary | ICD-10-CM | POA: Diagnosis not present

## 2023-03-28 DIAGNOSIS — J449 Chronic obstructive pulmonary disease, unspecified: Secondary | ICD-10-CM | POA: Diagnosis not present

## 2023-04-06 ENCOUNTER — Other Ambulatory Visit: Payer: Self-pay | Admitting: Family Medicine

## 2023-04-06 DIAGNOSIS — I2699 Other pulmonary embolism without acute cor pulmonale: Secondary | ICD-10-CM

## 2023-04-09 ENCOUNTER — Encounter: Payer: Self-pay | Admitting: Family Medicine

## 2023-04-09 ENCOUNTER — Telehealth: Payer: Self-pay

## 2023-04-09 ENCOUNTER — Ambulatory Visit (INDEPENDENT_AMBULATORY_CARE_PROVIDER_SITE_OTHER): Payer: PPO | Admitting: Family Medicine

## 2023-04-09 VITALS — BP 134/82 | HR 70 | Temp 97.6°F | Ht 67.0 in | Wt 272.0 lb

## 2023-04-09 DIAGNOSIS — Z7409 Other reduced mobility: Secondary | ICD-10-CM | POA: Insufficient documentation

## 2023-04-09 DIAGNOSIS — J4489 Other specified chronic obstructive pulmonary disease: Secondary | ICD-10-CM | POA: Diagnosis not present

## 2023-04-09 DIAGNOSIS — G4709 Other insomnia: Secondary | ICD-10-CM

## 2023-04-09 DIAGNOSIS — F411 Generalized anxiety disorder: Secondary | ICD-10-CM

## 2023-04-09 DIAGNOSIS — I5032 Chronic diastolic (congestive) heart failure: Secondary | ICD-10-CM

## 2023-04-09 DIAGNOSIS — M170 Bilateral primary osteoarthritis of knee: Secondary | ICD-10-CM

## 2023-04-09 DIAGNOSIS — H66004 Acute suppurative otitis media without spontaneous rupture of ear drum, recurrent, right ear: Secondary | ICD-10-CM

## 2023-04-09 DIAGNOSIS — E1142 Type 2 diabetes mellitus with diabetic polyneuropathy: Secondary | ICD-10-CM | POA: Diagnosis not present

## 2023-04-09 MED ORDER — AMOXICILLIN-POT CLAVULANATE 875-125 MG PO TABS: 1.0000 | ORAL_TABLET | Freq: Two times a day (BID) | ORAL | 0 refills | Status: DC

## 2023-04-09 NOTE — Assessment & Plan Note (Signed)
Start Augmentin, 1 pill twice daily for 10 days. Continue Flonase and azelastine nasal sprays regularly. Referral to ENT (Dr. Gorden Harms at [facility]) for further evaluation and consideration of tympanostomy tube placement.

## 2023-04-09 NOTE — Progress Notes (Signed)
Assessment/Plan:   Problem List Items Addressed This Visit       Cardiovascular and Mediastinum   Chronic diastolic CHF (congestive heart failure) (HCC)   Relevant Orders   DME Wheelchair electric     Respiratory   COPD with chronic bronchitis (HCC)   Relevant Orders   DME Wheelchair electric     Endocrine   Diabetic peripheral neuropathy associated with type 2 diabetes mellitus (HCC)    Improved.   Continue Elavil 75 mg nightly and gabapentin ointment as needed.      Relevant Orders   DME Wheelchair electric     Nervous and Auditory   Recurrent acute suppurative otitis media of right ear without spontaneous rupture of tympanic membrane - Primary    Start Augmentin, 1 pill twice daily for 10 days. Continue Flonase and azelastine nasal sprays regularly. Referral to ENT (Dr. Gorden Harms at [facility]) for further evaluation and consideration of tympanostomy tube placement.      Relevant Medications   amoxicillin-clavulanate (AUGMENTIN) 875-125 MG tablet   Other Relevant Orders   Ambulatory referral to ENT     Musculoskeletal and Integument   Bilateral primary osteoarthritis of knee   Relevant Orders   DME Wheelchair electric     Other   Insomnia (Chronic)    Improved with Elavil 75 mg nightly.  Continue current medication.      GAD (generalized anxiety disorder)    Stable on Elavil 75 mg nightly.  Plan: Continue Elavil 75 mg nightly Continue monitor mood      Limited mobility    Patient is unable to ambulate due to knee pain, diabetic neuropathy, chronic pain, CHF, and COPD. Requires assistance with activities of daily living.  Plan: Completed face-to-face assessment for electric wheelchair as per Medicare requirements. Wheelchair paperwork completed and submitted.      Relevant Orders   DME Wheelchair electric    Medications Discontinued During This Encounter  Medication Reason   amoxicillin-clavulanate (AUGMENTIN) 875-125 MG tablet     Return if  symptoms worsen or fail to improve.    Subjective:   Encounter date: 04/09/2023  [name] is a 88 y.o. female who has Hyperlipidemia; Class 3 obesity; Symptomatic anemia; TENSION HEADACHE; Essential hypertension; History of DVT (deep vein thrombosis); Chronic diastolic CHF (congestive heart failure) (HCC); Long term current use of anticoagulant therapy; Insomnia; Estrogen deficiency; COPD with chronic bronchitis (HCC); Drug-induced constipation; Vertigo; Gout; Acute kidney injury superimposed on chronic kidney disease (HCC); Vitamin D deficiency; Endometrial polyp; GERD with esophagitis; Dementia (HCC); Type 2 diabetes mellitus with hyperlipidemia (HCC); Moderate nonproliferative diabetic retinopathy of both eyes without macular edema associated with type 2 diabetes mellitus (HCC); Chronic radicular lumbar pain; Lumbar spondylosis; Spinal stenosis, lumbar region, with neurogenic claudication; Bilateral primary osteoarthritis of knee; Iron deficiency anemia; Chronic pain of right hand; Breast cancer (HCC); Severe episode of recurrent major depressive disorder, without psychotic features (HCC); Pneumobilia; Dermatosis papulosa nigra; CKD (chronic kidney disease), stage IV (HCC); Type 2 diabetes mellitus with stage 4 chronic kidney disease, with long-term current use of insulin (HCC); Type 2 diabetes mellitus with diabetic neuropathy, with long-term current use of insulin (HCC); COPD with acute exacerbation (HCC); GAD (generalized anxiety disorder); Allergic rhinitis; Diabetic peripheral neuropathy associated with type 2 diabetes mellitus (HCC); Swelling of right upper extremity; Carpal tunnel syndrome on both sides; Very limited skin mobility; Chronic left hip pain; Dependent for medication administration; Chills; Upper GI bleed; CAD (coronary artery disease); Epigastric abdominal tenderness; Chronic ethmoidal sinusitis; ABLA (  acute blood loss anemia); Recurrent acute suppurative otitis media of right  ear without spontaneous rupture of tympanic membrane; Viral URI with cough; Concha bullosa; Tinnitus of both ears; PND (post-nasal drip); and Limited mobility on their problem list..   She  has a past medical history of Abdominal pain (01/26/2012), Acute deep vein thrombosis (DVT) of femoral vein of right lower extremity (HCC) (03/26/2022), Acute kidney injury superimposed on chronic kidney disease (HCC) (12/18/2014), Acute upper GI bleed (03/26/2022), Allergy, Anemia, unspecified, Anxiety, Anxiety associated with depression (06/08/2017), Arthritis, Breast cancer (HCC), Breast cancer screening (02/19/2014), Breast mass (10/27/2014), Chronic diastolic CHF (congestive heart failure) (HCC), Chronic pain syndrome (02/06/2015), CKD (chronic kidney disease), stage III (HCC), Coronary artery disease, Debility (08/01/2012), Dementia (HCC), Depression, Diabetes mellitus, DOE (dyspnea on exertion) (06/24/2014), Dysphagia, unspecified(787.20) (07/31/2012), Fungal dermatitis (11/13/2007), GERD (gastroesophageal reflux disease), Gout, unspecified, Headache, History of anemia due to chronic kidney disease (03/26/2022), History of blood clots, History of deep venous thrombosis (01/27/2012), History of pulmonary embolus (PE) (04/22/2014), Hyperkalemia (07/26/2017), Hyperlipidemia, Hypertension, Insomnia, Joint pain, Joint swelling, Morbid obesity (HCC), Multiple benign nevi (11/15/2016), Myocardial infarction (HCC) (2004), Non-STEMI (non-ST elevated myocardial infarction) (HCC) (02/15/2020), Numbness, Obstructive sleep apnea, Osteoarthritis, Osteoarthritis, Osteoarthrosis, unspecified whether generalized or localized, lower leg, Pain in the chest (12/31/2013), Pain, chronic, Personal history of other diseases of the digestive system (12/03/2006), Polymyalgia rheumatica (HCC), Presence of stent in right coronary artery, Pulmonary emboli (HCC) (01/2012), Situational depression (06/04/2009), Syncope, vasovagal (07/04/2021), Type 2  diabetes mellitus with hyperlipidemia (HCC) (02/16/2022), Urinary incontinence, Vaginitis and vulvovaginitis (06/23/2016), Vertigo, and Wears dentures..   Chief Complaint: Follow-up visit for right ear blockage, sinus congestion, and mobility assessment for electric wheelchair.  History of Present Illness:   Patient presents for a follow-up visit to assess her mobility limitations for an electric wheelchair and to evaluate right ear symptoms. She reports that her right ear feels blocked with decreased hearing, which affects her ability to speak. She denies pain but notes occasional dizziness associated with the ear symptoms. She has a history of chronic sinusitis and recurrent right sided ear infections, previously managed with Augmentin and nasal sprays (Flonase and azelastine), which have helped in the past. She continues to experience fluid draining down the back of her throat.  Regarding mobility, she is unable to ambulate due to knee pain, shortness of breath, diabetic neuropathy, chronic pain, congestive heart failure, and COPD. She requires assistance with activities of daily living, including dressing, feeding, bathing, and transportation. Family members assist her in moving from bed to wheelchair, and she is in a wheelchair any time she moves or goes out.  Neuropathy.  Significantly improved with Elavil 75 mg nightly and gabapentin ointment.  Insomnia.  Improved with Elavil 75 mg nightly.  Anxiety.  Reports mood is significantly improved with the Elavil 75 mg nightly.  Review of Systems:  General: Reports being cold all the time, no fevers or chills. HEENT: Positive for right ear fullness, decreased hearing, occasional headaches. Respiratory: Reports shortness of breath upon exertion. Cardiovascular: No new chest pain or palpitations. Gastrointestinal: No abdominal pain, nausea, or vomiting. Musculoskeletal: Reports inability to ambulate due to knee pain; persistent neuropathic pain  managed with medications. Neurological: Positive for occasional dizziness; no new neurological deficits observed. Psychiatric: Patient expresses no current anxiety episodes. The remainder of the review of systems is negative.     04/09/2023    2:07 PM 04/09/2023    2:06 PM 03/21/2023   10:46 AM 12/21/2022    1:04 PM 12/19/2022  2:51 PM  Depression screen PHQ 2/9  Decreased Interest 0 0 0 0 1  Down, Depressed, Hopeless 0 0 0 0 2  PHQ - 2 Score 0 0 0 0 3  Altered sleeping 0    3  Tired, decreased energy 0    1  Change in appetite 0    2  Feeling bad or failure about yourself  0    0  Trouble concentrating 0    1  Moving slowly or fidgety/restless 0    1  Suicidal thoughts 0    0  PHQ-9 Score 0    11  Difficult doing work/chores Not difficult at all    Somewhat difficult      12/19/2022    2:51 PM 07/12/2022    2:10 PM 12/05/2021    1:51 PM 11/10/2021   11:03 AM  GAD 7 : Generalized Anxiety Score  Nervous, Anxious, on Edge 2 3 3 3   Control/stop worrying 1 3 3 3   Worry too much - different things 2 3 3 3   Trouble relaxing 3 3 3 3   Restless 2 3 3 2   Easily annoyed or irritable 1 3 3 3   Afraid - awful might happen 0 3 3 2   Total GAD 7 Score 11 21 21 19   Anxiety Difficulty Somewhat difficult Not difficult at all Extremely difficult Not difficult at all       Past Surgical History:  Procedure Laterality Date   ABDOMINAL HYSTERECTOMY     partial   APPENDECTOMY     blood clots/legs and lungs  2013   BREAST BIOPSY Left 07/22/2014   BREAST BIOPSY Left 02/10/2013   BREAST LUMPECTOMY Left 11/05/2014   BREAST LUMPECTOMY WITH RADIOACTIVE SEED LOCALIZATION Left 11/05/2014   Procedure: LEFT BREAST LUMPECTOMY WITH RADIOACTIVE SEED LOCALIZATION;  Surgeon: Abigail Miyamoto, MD;  Location: [facility] OR;  Service: General;  Laterality: Left;   CARDIAC CATHETERIZATION     COLONOSCOPY     CORONARY ANGIOPLASTY  2   CORONARY STENT INTERVENTION N/A 02/17/2020   Procedure: CORONARY STENT  INTERVENTION;  Surgeon: Marykay Lex, MD;  Location: [facility] INVASIVE CV LAB;  Service: Cardiovascular;  Laterality: N/A;   ESOPHAGOGASTRODUODENOSCOPY (EGD) WITH PROPOFOL N/A 11/07/2016   Procedure: ESOPHAGOGASTRODUODENOSCOPY (EGD) WITH PROPOFOL;  Surgeon: Iva Boop, MD;  Location: [facility] ENDOSCOPY;  Service: Endoscopy;  Laterality: N/A;   ESOPHAGOGASTRODUODENOSCOPY (EGD) WITH PROPOFOL N/A 01/05/2023   Procedure: ESOPHAGOGASTRODUODENOSCOPY (EGD) WITH PROPOFOL;  Surgeon: Hilarie Fredrickson, MD;  Location: [facility] ENDOSCOPY;  Service: Gastroenterology;  Laterality: N/A;   EXCISION OF SKIN TAG Right 11/05/2014   Procedure: EXCISION OF RIGHT EYELID SKIN TAG;  Surgeon: Abigail Miyamoto, MD;  Location: [facility] OR;  Service: General;  Laterality: Right;   EYE SURGERY Bilateral    cataract    GASTRIC BYPASS  1977    reversed in [facility], [facility]   LEFT HEART CATH AND CORONARY ANGIOGRAPHY N/A 02/17/2020   Procedure: LEFT HEART CATH AND CORONARY ANGIOGRAPHY;  Surgeon: Marykay Lex, MD;  Location: [facility] INVASIVE CV LAB;  Service: Cardiovascular;  Laterality: N/A;   LEFT HEART CATHETERIZATION WITH CORONARY ANGIOGRAM N/A 06/29/2014   Procedure: LEFT HEART CATHETERIZATION WITH CORONARY ANGIOGRAM;  Surgeon: Lennette Bihari, MD;  Location: [facility] CATH LAB;  Service: Cardiovascular;  Laterality: N/A;   MEMBRANE PEEL Right 10/23/2018   Procedure: MEMBRANE PEEL;  Surgeon: Edmon Crape, MD;  Location: [facility] OR;  Service: Ophthalmology;  Laterality: Right;   MI with stent placement  2004   PARS PLANA VITRECTOMY Right 10/23/2018   Procedure: PARS PLANA VITRECTOMY WITH 25 GAUGE;  Surgeon: Edmon Crape, MD;  Location: [facility] OR;  Service: Ophthalmology;  Laterality: Right;    Outpatient Medications Prior to Visit  Medication Sig Dispense Refill   albuterol (PROAIR HFA) 108 (90 Base) MCG/ACT inhaler Inhale 1-2 puffs into the lungs every 6 (six) hours as needed for wheezing or shortness of breath. 6.7 g 1   amitriptyline (ELAVIL) 75 MG tablet  Take 1 tablet (75 mg total) by mouth at bedtime. 90 tablet 0   atorvastatin (LIPITOR) 80 MG tablet TAKE 1 TABLET BY MOUTH EVERY DAY (Patient taking differently: Take 80 mg by mouth at bedtime.) 90 tablet 3   azelastine (ASTELIN) 0.1 % nasal spray Place 2 sprays into both nostrils 2 (two) times daily. 30 mL 12   benzonatate (TESSALON) 100 MG capsule Take 1 capsule (100 mg total) by mouth 2 (two) times daily as needed for cough. 20 capsule 0   carvedilol (COREG) 12.5 MG tablet TAKE 1 TABLET BY MOUTH 2 TIMES DAILY (Patient taking differently: Take 12.5 mg by mouth 2 (two) times daily with a meal.) 180 tablet 1   cetirizine (ZYRTEC) 10 MG tablet Take 10 mg by mouth daily as needed for allergies or rhinitis.     Continuous Glucose Sensor (FREESTYLE LIBRE 14 DAY SENSOR) MISC PLACE 1 DEVICE ON THE SKIN AS DIRECTED EVERY 14 DAYS (Patient taking differently: Inject 1 Device into the skin every 14 (fourteen) days.) 6 each 2   diclofenac Sodium (VOLTAREN) 1 % GEL Apply 2 g topically 4 (four) times daily. (Patient taking differently: Apply 2 g topically 4 (four) times daily as needed (pain).) 150 g 2   docusate sodium (COLACE) 100 MG capsule Take 2 capsules (200 mg total) by mouth daily. 180 capsule 3   Elastic Bandages & Supports (B & B CARPAL TUNNEL BRACE) MISC Use brace on wrist as needed for wrist pain 2 each 10   ELIQUIS 5 MG TABS tablet TAKE 1 TABLET BY MOUTH 2 TIMES DAILY 180 tablet 3   febuxostat (ULORIC) 40 MG tablet TAKE 1 TABLET BY MOUTH EVERY DAY 90 tablet 3   Fluticasone-Umeclidin-Vilant (TRELEGY ELLIPTA) 100-62.5-25 MCG/ACT AEPB Inhale 1 puff into the lungs daily. 1 each 11   folic acid (FOLVITE) 1 MG tablet TAKE 1 TABLET BY MOUTH EVERY DAY (Patient taking differently: Take 1 mg by mouth daily.) 90 tablet 1   Gabapentin 10 % CREA Apply 1 Application topically 3 (three) times daily as needed (neuropathy). 90 g 1   glucagon 1 MG injection Inject 1 mg into the vein once as needed for up to 1 dose. 1  each 12   glucose 4 GM chewable tablet Chew 1 tablet (4 g total) by mouth as needed for low blood sugar. 50 tablet 12   hydrOXYzine (ATARAX) 25 MG tablet Take 1 tablet (25 mg total) by mouth every 8 (eight) hours as needed for anxiety or itching (nasal and throat congetion). 120 tablet 0   insulin glargine, 2 Unit Dial, (TOUJEO MAX SOLOSTAR) 300 UNIT/ML Solostar Pen Inject 40 Units into the skin daily. Patient assistance program provides. Patient reports 40 units 04/24/2022 (Patient taking differently: Inject 34 Units into the skin daily before breakfast.) 12 mL 1   ipratropium-albuterol (DUONEB) 0.5-2.5 (3) MG/3ML SOLN Take 3 mLs by nebulization every 6 (six) hours as needed (cough, wheezing, or shortness of breath). 360 mL 0   linaclotide (LINZESS) 145  MCG CAPS capsule Patient take 1 capsule daily.  May add an additional capsule for breakthrough constipation associated with opioid use (Patient taking differently: Take 145 mcg by mouth every other day.) 180 capsule 1   meclizine (ANTIVERT) 25 MG tablet Take 1-2 tablets (25-50 mg total) by mouth 2 (two) times daily as needed for dizziness. 90 tablet 0   memantine (NAMENDA) 5 MG tablet TAKE 2 TABLETS BY MOUTH EVERY MORNING and TAKE 1 TABLET BY MOUTH EVERY EVENING (Patient taking differently: Take 5-10 mg by mouth See admin instructions. Take 10 mg by mouth in the morning and 5 mg in the evening) 270 tablet 5   montelukast (SINGULAIR) 10 MG tablet Take 1 tablet (10 mg total) by mouth at bedtime. 30 tablet 3   nitroGLYCERIN (NITROSTAT) 0.4 MG SL tablet Take one tablet under the tongue every 5 minutes as needed for chest pain (Patient taking differently: Place 0.4 mg under the tongue every 5 (five) minutes as needed for chest pain.) 50 tablet 0   NOVOLOG FLEXPEN 100 UNIT/ML FlexPen Inject 14 Units into the skin 2 (two) times daily with a meal. Max daily dose 70 units 60 mL 0   nystatin (MYCOSTATIN/NYSTOP) powder Apply 1 Application topically 3 (three) times  daily. 60 g 3   ondansetron (ZOFRAN) 4 MG tablet Take 1 tablet (4 mg total) by mouth every 8 (eight) hours as needed for nausea or vomiting. 20 tablet 0   oxyCODONE-acetaminophen (PERCOCET) 7.5-325 MG tablet Take 1 tablet by mouth 2 (two) times daily as needed (for pain).     pantoprazole (PROTONIX) 20 MG tablet TAKE 1 TABLET BY MOUTH EVERY DAY (Patient taking differently: Take 20 mg by mouth daily before breakfast.) 30 tablet 5   Semaglutide,0.25 or 0.5MG /DOS, (OZEMPIC, 0.25 OR 0.5 MG/DOSE,) 2 MG/3ML SOPN Inject 0.5 mg into the skin once a week. (Patient taking differently: Inject 0.5 mg into the skin every Monday.) 3 mL 11   sodium polystyrene (SPS) 15 GM/60ML suspension TAKE 60 MLS BY MOUTH ONCE WEEKLY TO KEEP POTASSIUM DOWN 240 mL 0   torsemide (DEMADEX) 20 MG tablet TAKE 1 TABLET BY MOUTH ONCE DAILY - MAY TAKE AN EXTRA DOES FOR WEIGHT GAIN OF 3LBs IN 1 DAY OR 5LBS IN 1 WEEK OR FOR WORSENING SWELLING 30 tablet 0   TYLENOL 500 MG tablet Take 1,000 mg by mouth every 6 (six) hours as needed for mild pain or headache.     TYLENOL PM EXTRA STRENGTH 500-25 MG TABS tablet Take 2 tablets by mouth at bedtime.     Vitamin D, Ergocalciferol, (DRISDOL) 1.25 MG (50000 UNIT) CAPS capsule TAKE 1 CAPSULE BY MOUTH ONCE WEEKLY 4 capsule 1   amoxicillin-clavulanate (AUGMENTIN) 875-125 MG tablet Take 1 tablet by mouth 2 (two) times daily. 20 tablet 0   fluticasone (FLONASE) 50 MCG/ACT nasal spray Place 2 sprays into both nostrils daily for 14 days. (Patient taking differently: Place 2 sprays into both nostrils every other day.) 1 g 0   No facility-administered medications prior to visit.    Family History  Problem Relation Age of Onset   Breast cancer Mother 83   Heart disease Mother    Throat cancer Father    Hypertension Father    Arthritis Father    Diabetes Father    Arthritis Sister    Obesity Sister    Diabetes Sister    Heart disease Cousin    Colon cancer Neg Hx    Stomach cancer Neg Hx  Esophageal cancer Neg Hx     Social History   Socioeconomic History   Marital status: Widowed    Spouse name: Not on file   Number of children: 3   Years of education: Not on file   Highest education level: High school graduate  Occupational History   Occupation: retired  Tobacco Use   Smoking status: Never    Passive exposure: Past   Smokeless tobacco: Never   Tobacco comments:    Both parents smoked, patient was exposed/ "Raised up in smoke, my whole life."  Vaping Use   Vaping status: Never Used  Substance and Sexual Activity   Alcohol use: No    Alcohol/week: 0.0 standard drinks of alcohol   Drug use: No   Sexual activity: Not on file  Other Topics Concern   Not on file  Social History Narrative   Not on file   Social Determinants of Health   Financial Resource Strain: Low Risk  (06/05/2022)   Overall Financial Resource Strain (CARDIA)    Difficulty of Paying Living Expenses: Not hard at all  Food Insecurity: No Food Insecurity (01/09/2023)   Hunger Vital Sign    Worried About Running Out of Food in the Last Year: Never true    Ran Out of Food in the Last Year: Never true  Transportation Needs: No Transportation Needs (01/03/2023)   PRAPARE - Administrator, Civil Service (Medical): No    Lack of Transportation (Non-Medical): No  Physical Activity: Insufficiently Active (06/05/2022)   Exercise Vital Sign    Days of Exercise per Week: 3 days    Minutes of Exercise per Session: 10 min  Stress: Stress Concern Present (06/05/2022)   [employer] of Occupational Health - Occupational Stress Questionnaire    Feeling of Stress : Rather much  Social Connections: Moderately Isolated (04/20/2022)   Social Connection and Isolation Panel [NHANES]    Frequency of Communication with Friends and Family: Once a week    Frequency of Social Gatherings with Friends and Family: Once a week    Attends Religious Services: 1 to 4 times per year    Active Member of [employer]  or Organizations: Yes    Attends Banker Meetings: 1 to 4 times per year    Marital Status: Widowed  Intimate Partner Violence: Not At Risk (01/03/2023)   Humiliation, Afraid, Rape, and Kick questionnaire    Fear of Current or Ex-Partner: No    Emotionally Abused: No    Physically Abused: No    Sexually Abused: No                                                                                                  Objective:  Physical Exam: BP 134/82 (BP Location: Left Arm, Patient Position: Sitting, Cuff Size: Large)   Pulse 70   Temp 97.6 F (36.4 C) (Temporal)   Ht 5\' 7"  (1.702 m)   Wt 272 lb (123.4 kg)   SpO2 97%   BMI 42.60 kg/m     Physical Exam Constitutional:      Appearance:  She is obese. She is not ill-appearing.  HENT:     Head: Normocephalic and atraumatic.     Right Ear: Hearing and ear canal normal. Tenderness present. A middle ear effusion is present. Tympanic membrane is injected.     Left Ear: Hearing, tympanic membrane and ear canal normal.     Nose: Nose normal.  Eyes:     General: No scleral icterus.       Right eye: No discharge.        Left eye: No discharge.     Extraocular Movements: Extraocular movements intact.  Cardiovascular:     Rate and Rhythm: Normal rate and regular rhythm.     Heart sounds: Normal heart sounds.     Comments: No cyanosis, no JVD Pulmonary:     Effort: Pulmonary effort is normal.     Breath sounds: Normal breath sounds. No decreased air movement.     Comments: No auditory wheezing Abdominal:     General: Abdomen is flat. There is no distension.     Palpations: Abdomen is soft.     Tenderness: There is no abdominal tenderness.  Musculoskeletal:     Right lower leg: Edema present.     Left lower leg: Edema present.  Skin:    General: Skin is warm.     Findings: No rash.  Neurological:     General: No focal deficit present.     Mental Status: She is alert.     Cranial Nerves: No cranial nerve deficit.      Motor: Weakness (Wheelchair-bound) present.  Psychiatric:        Attention and Perception: She is attentive.        Judgment: Judgment is not impulsive.     No results found.  Recent Results (from the past 2160 hour(s))  Basic Metabolic Panel (BMET)     Status: Abnormal   Collection Time: 01/10/23  3:37 PM  Result Value Ref Range   Sodium 137 135 - 145 mEq/L   Potassium 5.1 3.5 - 5.1 mEq/L   Chloride 106 96 - 112 mEq/L   CO2 25 19 - 32 mEq/L   Glucose, Bld 170 (H) 70 - 99 mg/dL   BUN 49 (H) 6 - 23 mg/dL   Creatinine, Ser 1.61 (H) 0.40 - 1.20 mg/dL   GFR 09.60 (L) >45.40 mL/min    Comment: Calculated using the CKD-EPI Creatinine Equation (2021)   Calcium 8.1 (L) 8.4 - 10.5 mg/dL  CBC w/Diff     Status: Abnormal   Collection Time: 01/10/23  3:37 PM  Result Value Ref Range   WBC 13.4 (H) 4.0 - 10.5 K/uL   RBC 3.43 (L) 3.87 - 5.11 Mil/uL   Hemoglobin 9.8 (L) 12.0 - 15.0 g/dL   HCT 98.1 (L) 19.1 - 47.8 %   MCV 89.5 78.0 - 100.0 fl   MCHC 31.7 30.0 - 36.0 g/dL   RDW 29.5 62.1 - 30.8 %   Platelets 229.0 150.0 - 400.0 K/uL   Neutrophils Relative % 78.4 (H) 43.0 - 77.0 %   Lymphocytes Relative 14.0 12.0 - 46.0 %   Monocytes Relative 5.7 3.0 - 12.0 %   Eosinophils Relative 1.5 0.0 - 5.0 %   Basophils Relative 0.4 0.0 - 3.0 %   Neutro Abs 10.5 (H) 1.4 - 7.7 K/uL   Lymphs Abs 1.9 0.7 - 4.0 K/uL   Monocytes Absolute 0.8 0.1 - 1.0 K/uL   Eosinophils Absolute 0.2 0.0 - 0.7 K/uL   Basophils  Absolute 0.1 0.0 - 0.1 K/uL  ALT     Status: None   Collection Time: 03/05/23 12:36 PM  Result Value Ref Range   ALT 14 0 - 32 IU/L  Lipid panel     Status: Abnormal   Collection Time: 03/05/23 12:36 PM  Result Value Ref Range   Cholesterol, Total 81 (L) 100 - 199 mg/dL   Triglycerides 74 0 - 149 mg/dL   HDL 31 (L) >18 mg/dL   VLDL Cholesterol Cal 16 5 - 40 mg/dL   LDL Chol Calc (NIH) 34 0 - 99 mg/dL   Chol/HDL Ratio 2.6 0.0 - 4.4 ratio    Comment:                                   T.  Chol/HDL Ratio                                             Men  Women                               1/2 Avg.Risk  3.4    3.3                                   Avg.Risk  5.0    4.4                                2X Avg.Risk  9.6    7.1                                3X Avg.Risk 23.4   11.0   Basic metabolic panel     Status: Abnormal   Collection Time: 03/05/23 12:36 PM  Result Value Ref Range   Glucose 118 (H) 70 - 99 mg/dL   BUN 59 (H) 8 - 27 mg/dL   Creatinine, Ser 8.41 (H) 0.57 - 1.00 mg/dL   eGFR 18 (L) >66 AY/TKZ/6.01   BUN/Creatinine Ratio 24 12 - 28   Sodium 141 134 - 144 mmol/L   Potassium 5.6 (H) 3.5 - 5.2 mmol/L   Chloride 105 96 - 106 mmol/L   CO2 21 20 - 29 mmol/L   Calcium 8.6 (L) 8.7 - 10.3 mg/dL  CMP ([facility] only)     Status: Abnormal   Collection Time: 03/06/23 12:16 PM  Result Value Ref Range   Sodium 137 135 - 145 mmol/L   Potassium 5.5 (H) 3.5 - 5.1 mmol/L   Chloride 106 98 - 111 mmol/L   CO2 25 22 - 32 mmol/L   Glucose, Bld 155 (H) 70 - 99 mg/dL    Comment: Glucose reference range applies only to samples taken after fasting for at least 8 hours.   BUN 63 (H) 8 - 23 mg/dL   Creatinine 0.93 (H) 2.35 - 1.00 mg/dL   Calcium 8.7 (L) 8.9 - 10.3 mg/dL   Total Protein 7.4 6.5 - 8.1 g/dL   Albumin 3.4 (L) 3.5 - 5.0 g/dL   AST 14 (L) 15 - 41  U/L   ALT 12 0 - 44 U/L   Alkaline Phosphatase 80 38 - 126 U/L   Total Bilirubin 0.4 0.3 - 1.2 mg/dL   GFR, Estimated 20 (L) >60 mL/min    Comment: (NOTE) Calculated using the CKD-EPI Creatinine Equation (2021)    Anion gap 6 5 - 15    Comment: Performed at [facility], [address] [place], [place] [address]  CBC with Differential ([facility] Only)     Status: Abnormal   Collection Time: 03/06/23 12:16 PM  Result Value Ref Range   WBC Count 9.3 4.0 - 10.5 K/uL   RBC 2.95 (L) 3.87 - 5.11 MIL/uL   Hemoglobin 8.5 (L) 12.0 - 15.0 g/dL   HCT 78.2 (L) 95.6 - 21.3 %   MCV 93.6 80.0 -  100.0 fL   MCH 28.8 26.0 - 34.0 pg   MCHC 30.8 30.0 - 36.0 g/dL   RDW 08.6 (H) 57.8 - 46.9 %   Platelet Count 223 150 - 400 K/uL   nRBC 0.0 0.0 - 0.2 %   Neutrophils Relative % 70 %   Neutro Abs 6.4 1.7 - 7.7 K/uL   Lymphocytes Relative 22 %   Lymphs Abs 2.1 0.7 - 4.0 K/uL   Monocytes Relative 5 %   Monocytes Absolute 0.5 0.1 - 1.0 K/uL   Eosinophils Relative 3 %   Eosinophils Absolute 0.3 0.0 - 0.5 K/uL   Basophils Relative 0 %   Basophils Absolute 0.0 0.0 - 0.1 K/uL   Immature Granulocytes 0 %   Abs Immature Granulocytes 0.03 0.00 - 0.07 K/uL    Comment: Performed at [facility], [address] [place], [place] [address]  Iron and Iron Binding Capacity ([facility] only)     Status: Abnormal   Collection Time: 03/06/23 12:16 PM  Result Value Ref Range   Iron 41 28 - 170 ug/dL   TIBC 841 (L) 324 - 401 ug/dL   Saturation Ratios 19 10.4 - 31.8 %   UIBC 176 148 - 442 ug/dL    Comment: Performed at [facility], [address] [place], [place] [address]  Ferritin     Status: None   Collection Time: 03/06/23 12:17 PM  Result Value Ref Range   Ferritin 207 11 - 307 ng/mL    Comment: Performed at [facility], [address] [place], [place] [address]  Basic metabolic panel     Status: Abnormal   Collection Time: 03/13/23 12:30 PM  Result Value Ref Range   Glucose 183 (H) 70 - 99 mg/dL   BUN 57 (H) 8 - 27 mg/dL   Creatinine, Ser 0.34 (H) 0.57 - 1.00 mg/dL   eGFR 22 (L) >74 QV/ZDG/3.87   BUN/Creatinine Ratio 27 12 - 28   Sodium 139 134 - 144 mmol/L   Potassium 5.0 3.5 - 5.2 mmol/L   Chloride 105 96 - 106 mmol/L   CO2 25 20 - 29 mmol/L   Calcium 8.7 8.7 - 10.3 mg/dL        Garner Nash, MD, MS

## 2023-04-09 NOTE — Assessment & Plan Note (Signed)
Patient is unable to ambulate due to knee pain, diabetic neuropathy, chronic pain, CHF, and COPD. Requires assistance with activities of daily living.  Plan: Completed face-to-face assessment for electric wheelchair as per Medicare requirements. Wheelchair paperwork completed and submitted.

## 2023-04-09 NOTE — Patient Instructions (Signed)
-   Take Augmentin as prescribed: One pill twice a day for 10 days to help treat your ear infection. - Continue using Flonase and azelastine nasal sprays: Use them regularly as directed to manage your sinus symptoms. - Use Tylenol as needed for pain relief: Take it when you experience headaches or discomfort. - Attend the upcoming ENT appointment: A referral has been made to Dr. Leeanne Mannan at [facility] to address your ear symptoms. - Expect contact regarding your electric wheelchair: The paperwork has been completed; the mobility company will reach out to you about the next steps.

## 2023-04-09 NOTE — Assessment & Plan Note (Signed)
Stable on Elavil 75 mg nightly.  Plan: Continue Elavil 75 mg nightly Continue monitor mood

## 2023-04-09 NOTE — Telephone Encounter (Signed)
Faxed completed form with OV note and placed in scan folder.

## 2023-04-09 NOTE — Telephone Encounter (Signed)
-----   Message from [name] sent at 04/09/2023  3:12 PM EST ----- Note complete.  You may fax the required wheelchair paperwork.

## 2023-04-09 NOTE — Assessment & Plan Note (Signed)
Improved with Elavil 75 mg nightly.  Continue current medication.

## 2023-04-09 NOTE — Assessment & Plan Note (Signed)
Improved.   Continue Elavil 75 mg nightly and gabapentin ointment as needed.

## 2023-04-10 DIAGNOSIS — E113213 Type 2 diabetes mellitus with mild nonproliferative diabetic retinopathy with macular edema, bilateral: Secondary | ICD-10-CM | POA: Diagnosis not present

## 2023-04-10 DIAGNOSIS — H16142 Punctate keratitis, left eye: Secondary | ICD-10-CM | POA: Diagnosis not present

## 2023-04-10 DIAGNOSIS — H35363 Drusen (degenerative) of macula, bilateral: Secondary | ICD-10-CM | POA: Diagnosis not present

## 2023-04-10 DIAGNOSIS — Z961 Presence of intraocular lens: Secondary | ICD-10-CM | POA: Diagnosis not present

## 2023-04-10 LAB — HM DIABETES EYE EXAM

## 2023-04-18 ENCOUNTER — Other Ambulatory Visit: Payer: Self-pay | Admitting: Hematology

## 2023-04-18 ENCOUNTER — Other Ambulatory Visit: Payer: Self-pay | Admitting: Family Medicine

## 2023-04-18 DIAGNOSIS — I5032 Chronic diastolic (congestive) heart failure: Secondary | ICD-10-CM

## 2023-04-18 DIAGNOSIS — K219 Gastro-esophageal reflux disease without esophagitis: Secondary | ICD-10-CM

## 2023-04-19 ENCOUNTER — Encounter (HOSPITAL_COMMUNITY): Payer: Self-pay

## 2023-04-19 ENCOUNTER — Encounter: Payer: Self-pay | Admitting: Hematology

## 2023-04-27 DIAGNOSIS — J449 Chronic obstructive pulmonary disease, unspecified: Secondary | ICD-10-CM | POA: Diagnosis not present

## 2023-05-15 ENCOUNTER — Encounter: Payer: Self-pay | Admitting: Internal Medicine

## 2023-05-15 DIAGNOSIS — H903 Sensorineural hearing loss, bilateral: Secondary | ICD-10-CM | POA: Diagnosis not present

## 2023-05-15 DIAGNOSIS — H9313 Tinnitus, bilateral: Secondary | ICD-10-CM | POA: Diagnosis not present

## 2023-05-16 DIAGNOSIS — M179 Osteoarthritis of knee, unspecified: Secondary | ICD-10-CM | POA: Diagnosis not present

## 2023-05-16 DIAGNOSIS — N184 Chronic kidney disease, stage 4 (severe): Secondary | ICD-10-CM | POA: Diagnosis not present

## 2023-05-16 DIAGNOSIS — I5032 Chronic diastolic (congestive) heart failure: Secondary | ICD-10-CM | POA: Diagnosis not present

## 2023-05-16 DIAGNOSIS — Z993 Dependence on wheelchair: Secondary | ICD-10-CM | POA: Diagnosis not present

## 2023-05-16 DIAGNOSIS — M48062 Spinal stenosis, lumbar region with neurogenic claudication: Secondary | ICD-10-CM | POA: Diagnosis not present

## 2023-05-17 ENCOUNTER — Other Ambulatory Visit: Payer: Self-pay | Admitting: Family Medicine

## 2023-05-17 DIAGNOSIS — I5032 Chronic diastolic (congestive) heart failure: Secondary | ICD-10-CM

## 2023-05-21 DIAGNOSIS — E1169 Type 2 diabetes mellitus with other specified complication: Secondary | ICD-10-CM | POA: Diagnosis not present

## 2023-05-21 DIAGNOSIS — E261 Secondary hyperaldosteronism: Secondary | ICD-10-CM | POA: Diagnosis not present

## 2023-05-21 DIAGNOSIS — N184 Chronic kidney disease, stage 4 (severe): Secondary | ICD-10-CM | POA: Diagnosis not present

## 2023-05-21 DIAGNOSIS — E11649 Type 2 diabetes mellitus with hypoglycemia without coma: Secondary | ICD-10-CM | POA: Diagnosis not present

## 2023-05-21 DIAGNOSIS — J4489 Other specified chronic obstructive pulmonary disease: Secondary | ICD-10-CM | POA: Diagnosis not present

## 2023-05-21 DIAGNOSIS — D6869 Other thrombophilia: Secondary | ICD-10-CM | POA: Diagnosis not present

## 2023-05-21 DIAGNOSIS — F0284 Dementia in other diseases classified elsewhere, unspecified severity, with anxiety: Secondary | ICD-10-CM | POA: Diagnosis not present

## 2023-05-21 DIAGNOSIS — I700 Atherosclerosis of aorta: Secondary | ICD-10-CM | POA: Diagnosis not present

## 2023-05-21 DIAGNOSIS — I130 Hypertensive heart and chronic kidney disease with heart failure and stage 1 through stage 4 chronic kidney disease, or unspecified chronic kidney disease: Secondary | ICD-10-CM | POA: Diagnosis not present

## 2023-05-21 DIAGNOSIS — I4891 Unspecified atrial fibrillation: Secondary | ICD-10-CM | POA: Diagnosis not present

## 2023-05-21 DIAGNOSIS — E1142 Type 2 diabetes mellitus with diabetic polyneuropathy: Secondary | ICD-10-CM | POA: Diagnosis not present

## 2023-05-21 DIAGNOSIS — E1122 Type 2 diabetes mellitus with diabetic chronic kidney disease: Secondary | ICD-10-CM | POA: Diagnosis not present

## 2023-05-24 DIAGNOSIS — H02836 Dermatochalasis of left eye, unspecified eyelid: Secondary | ICD-10-CM | POA: Diagnosis not present

## 2023-05-24 DIAGNOSIS — H353132 Nonexudative age-related macular degeneration, bilateral, intermediate dry stage: Secondary | ICD-10-CM | POA: Diagnosis not present

## 2023-05-24 DIAGNOSIS — H02833 Dermatochalasis of right eye, unspecified eyelid: Secondary | ICD-10-CM | POA: Diagnosis not present

## 2023-05-24 DIAGNOSIS — H43822 Vitreomacular adhesion, left eye: Secondary | ICD-10-CM | POA: Diagnosis not present

## 2023-05-28 DIAGNOSIS — J449 Chronic obstructive pulmonary disease, unspecified: Secondary | ICD-10-CM | POA: Diagnosis not present

## 2023-05-31 ENCOUNTER — Ambulatory Visit: Payer: Self-pay | Admitting: Family Medicine

## 2023-05-31 NOTE — Telephone Encounter (Signed)
I called and spoke with patient and advised her that she really needs to go to the ED to be seen. Patient agreed and will go to [facility] to be evaluated.

## 2023-05-31 NOTE — Telephone Encounter (Signed)
Copied from CRM [id number]. Topic: Clinical - Red Word Triage >> May 31, 2023  3:40 PM Gurney Maxin H wrote: [facility] that prompted transfer to Nurse Triage: Larey Seat 3 days before Christmas hit head against closet door still having headaches    Chief Complaint: headache Symptoms: dizziness and 10/10 headache Frequency: constant, getting worse Pertinent Negatives: Patient denies bleeding Disposition: [x] ED /[] Urgent Care (no appt availability in office) / [] Appointment(In office/virtual)/ []  [facility]/ [] Home Care/ [x] Refused Recommended Disposition /[] [facility]/ []  Follow-up with PCP Additional Notes: Patient reports a fall around Dec 25th, states that she was walking from her bed to her wheelchair and her legs gave out. Patient states that she hit the top part of her head againt her closet door, knocking  the top part of the door loose. Pt states that she has been feeling increasingly dizzy since the incident and reports 10/10 pain. Pt does take Eliquis. RN advised ED. Patient refused. Call placed to [facility], RN was advised to route to clinical pool and they will follow-up. RN informed patient that someone will call her back. Pt verbalized understanding   Reason for Disposition  [1] SEVERE headache AND [2] not improved 2 hours after pain medicine/ice packs  Answer Assessment - Initial Assessment Questions 1. MECHANISM: "How did the injury happen?" For falls, ask: "What height did you fall from?" and "What surface did you fall against?"      Was getting up from bed, using her walker. Daughter was assistanting, but her knee "gave out" and patient hit head against closet oor  2. ONSET: "When did the injury happen?" (Minutes or hours ago)      4 weeks ago (3 days before Christmas)  3. NEUROLOGIC SYMPTOMS: "Was there any loss of consciousness?" "Are there any other neurological symptoms?"      No LOC, felt dizzy  4. MENTAL STATUS: "Does the person know who they are, who you are,  and where they are?"      Yes  5. LOCATION: "What part of the head was hit?"      Top of head hit closet door  6. SCALP APPEARANCE: "What does the scalp look like? Is it bleeding now?" If Yes, ask: "Is it difficult to stop?"      No  7. SIZE: For cuts, bruises, or swelling, ask: "How large is it?" (e.g., inches or centimeters)      No cuts or bruising  8. PAIN: "Is there any pain?" If Yes, ask: "How bad is it?"  (e.g., Scale 1-10; or mild, moderate, severe)     10/10 pain, getting worse since the fall  9. TETANUS: For any breaks in the skin, ask: "When was the last tetanus booster?"     no 10. OTHER SYMPTOMS: "Do you have any other symptoms?" (e.g., neck pain, vomiting)       Now dizzy  11. PREGNANCY: "Is there any chance you are pregnant?" "When was your last menstrual period?"       No  Protocols used: Head Injury-A-AH

## 2023-06-04 ENCOUNTER — Encounter (HOSPITAL_COMMUNITY): Payer: Self-pay

## 2023-06-04 ENCOUNTER — Inpatient Hospital Stay (HOSPITAL_COMMUNITY)
Admission: EM | Admit: 2023-06-04 | Discharge: 2023-06-08 | DRG: 641 | Disposition: A | Discharge status: Home / Self Care | Payer: PPO | Attending: Internal Medicine | Admitting: Internal Medicine

## 2023-06-04 ENCOUNTER — Other Ambulatory Visit: Payer: Self-pay

## 2023-06-04 ENCOUNTER — Emergency Department (HOSPITAL_COMMUNITY): Payer: PPO

## 2023-06-04 ENCOUNTER — Encounter: Payer: Self-pay | Admitting: Hematology

## 2023-06-04 DIAGNOSIS — Z66 Do not resuscitate: Secondary | ICD-10-CM | POA: Diagnosis present

## 2023-06-04 DIAGNOSIS — I5032 Chronic diastolic (congestive) heart failure: Secondary | ICD-10-CM | POA: Diagnosis present

## 2023-06-04 DIAGNOSIS — Z79899 Other long term (current) drug therapy: Secondary | ICD-10-CM

## 2023-06-04 DIAGNOSIS — E875 Hyperkalemia: Secondary | ICD-10-CM | POA: Diagnosis not present

## 2023-06-04 DIAGNOSIS — Z7985 Long-term (current) use of injectable non-insulin antidiabetic drugs: Secondary | ICD-10-CM

## 2023-06-04 DIAGNOSIS — S199XXA Unspecified injury of neck, initial encounter: Secondary | ICD-10-CM | POA: Diagnosis not present

## 2023-06-04 DIAGNOSIS — Z833 Family history of diabetes mellitus: Secondary | ICD-10-CM

## 2023-06-04 DIAGNOSIS — N179 Acute kidney failure, unspecified: Secondary | ICD-10-CM | POA: Diagnosis present

## 2023-06-04 DIAGNOSIS — Z7901 Long term (current) use of anticoagulants: Secondary | ICD-10-CM | POA: Diagnosis not present

## 2023-06-04 DIAGNOSIS — D259 Leiomyoma of uterus, unspecified: Secondary | ICD-10-CM | POA: Diagnosis not present

## 2023-06-04 DIAGNOSIS — I10 Essential (primary) hypertension: Secondary | ICD-10-CM | POA: Diagnosis not present

## 2023-06-04 DIAGNOSIS — M159 Polyosteoarthritis, unspecified: Secondary | ICD-10-CM | POA: Diagnosis present

## 2023-06-04 DIAGNOSIS — G894 Chronic pain syndrome: Secondary | ICD-10-CM | POA: Diagnosis present

## 2023-06-04 DIAGNOSIS — Z955 Presence of coronary angioplasty implant and graft: Secondary | ICD-10-CM

## 2023-06-04 DIAGNOSIS — D631 Anemia in chronic kidney disease: Secondary | ICD-10-CM | POA: Diagnosis present

## 2023-06-04 DIAGNOSIS — Z90710 Acquired absence of both cervix and uterus: Secondary | ICD-10-CM

## 2023-06-04 DIAGNOSIS — Z1152 Encounter for screening for COVID-19: Secondary | ICD-10-CM

## 2023-06-04 DIAGNOSIS — I252 Old myocardial infarction: Secondary | ICD-10-CM

## 2023-06-04 DIAGNOSIS — W01198A Fall on same level from slipping, tripping and stumbling with subsequent striking against other object, initial encounter: Secondary | ICD-10-CM | POA: Diagnosis present

## 2023-06-04 DIAGNOSIS — E1165 Type 2 diabetes mellitus with hyperglycemia: Secondary | ICD-10-CM | POA: Diagnosis present

## 2023-06-04 DIAGNOSIS — I2510 Atherosclerotic heart disease of native coronary artery without angina pectoris: Secondary | ICD-10-CM | POA: Diagnosis present

## 2023-06-04 DIAGNOSIS — I440 Atrioventricular block, first degree: Secondary | ICD-10-CM | POA: Diagnosis present

## 2023-06-04 DIAGNOSIS — Z808 Family history of malignant neoplasm of other organs or systems: Secondary | ICD-10-CM

## 2023-06-04 DIAGNOSIS — N189 Chronic kidney disease, unspecified: Secondary | ICD-10-CM | POA: Diagnosis not present

## 2023-06-04 DIAGNOSIS — S0990XA Unspecified injury of head, initial encounter: Secondary | ICD-10-CM | POA: Diagnosis not present

## 2023-06-04 DIAGNOSIS — Z8249 Family history of ischemic heart disease and other diseases of the circulatory system: Secondary | ICD-10-CM

## 2023-06-04 DIAGNOSIS — E861 Hypovolemia: Secondary | ICD-10-CM | POA: Diagnosis present

## 2023-06-04 DIAGNOSIS — N3000 Acute cystitis without hematuria: Secondary | ICD-10-CM | POA: Diagnosis not present

## 2023-06-04 DIAGNOSIS — M47812 Spondylosis without myelopathy or radiculopathy, cervical region: Secondary | ICD-10-CM | POA: Diagnosis not present

## 2023-06-04 DIAGNOSIS — E1169 Type 2 diabetes mellitus with other specified complication: Secondary | ICD-10-CM | POA: Diagnosis present

## 2023-06-04 DIAGNOSIS — Z8261 Family history of arthritis: Secondary | ICD-10-CM

## 2023-06-04 DIAGNOSIS — K2100 Gastro-esophageal reflux disease with esophagitis, without bleeding: Secondary | ICD-10-CM | POA: Diagnosis present

## 2023-06-04 DIAGNOSIS — N184 Chronic kidney disease, stage 4 (severe): Secondary | ICD-10-CM | POA: Diagnosis not present

## 2023-06-04 DIAGNOSIS — K5900 Constipation, unspecified: Secondary | ICD-10-CM | POA: Diagnosis present

## 2023-06-04 DIAGNOSIS — Z853 Personal history of malignant neoplasm of breast: Secondary | ICD-10-CM

## 2023-06-04 DIAGNOSIS — I130 Hypertensive heart and chronic kidney disease with heart failure and stage 1 through stage 4 chronic kidney disease, or unspecified chronic kidney disease: Secondary | ICD-10-CM | POA: Diagnosis present

## 2023-06-04 DIAGNOSIS — N281 Cyst of kidney, acquired: Secondary | ICD-10-CM | POA: Diagnosis not present

## 2023-06-04 DIAGNOSIS — Z794 Long term (current) use of insulin: Secondary | ICD-10-CM

## 2023-06-04 DIAGNOSIS — F0390 Unspecified dementia without behavioral disturbance: Secondary | ICD-10-CM | POA: Diagnosis present

## 2023-06-04 DIAGNOSIS — E785 Hyperlipidemia, unspecified: Secondary | ICD-10-CM | POA: Diagnosis present

## 2023-06-04 DIAGNOSIS — R630 Anorexia: Secondary | ICD-10-CM | POA: Diagnosis present

## 2023-06-04 DIAGNOSIS — N390 Urinary tract infection, site not specified: Secondary | ICD-10-CM | POA: Diagnosis present

## 2023-06-04 DIAGNOSIS — Z6823 Body mass index (BMI) 23.0-23.9, adult: Secondary | ICD-10-CM

## 2023-06-04 DIAGNOSIS — Z86711 Personal history of pulmonary embolism: Secondary | ICD-10-CM

## 2023-06-04 DIAGNOSIS — I6523 Occlusion and stenosis of bilateral carotid arteries: Secondary | ICD-10-CM | POA: Diagnosis not present

## 2023-06-04 DIAGNOSIS — J4489 Other specified chronic obstructive pulmonary disease: Secondary | ICD-10-CM | POA: Diagnosis present

## 2023-06-04 DIAGNOSIS — K8020 Calculus of gallbladder without cholecystitis without obstruction: Secondary | ICD-10-CM | POA: Diagnosis not present

## 2023-06-04 DIAGNOSIS — Z86718 Personal history of other venous thrombosis and embolism: Secondary | ICD-10-CM

## 2023-06-04 DIAGNOSIS — K449 Diaphragmatic hernia without obstruction or gangrene: Secondary | ICD-10-CM | POA: Diagnosis not present

## 2023-06-04 DIAGNOSIS — Z803 Family history of malignant neoplasm of breast: Secondary | ICD-10-CM

## 2023-06-04 DIAGNOSIS — M353 Polymyalgia rheumatica: Secondary | ICD-10-CM | POA: Diagnosis present

## 2023-06-04 DIAGNOSIS — E860 Dehydration: Secondary | ICD-10-CM | POA: Diagnosis present

## 2023-06-04 DIAGNOSIS — Z9884 Bariatric surgery status: Secondary | ICD-10-CM

## 2023-06-04 DIAGNOSIS — I6782 Cerebral ischemia: Secondary | ICD-10-CM | POA: Diagnosis not present

## 2023-06-04 DIAGNOSIS — E1122 Type 2 diabetes mellitus with diabetic chronic kidney disease: Secondary | ICD-10-CM

## 2023-06-04 LAB — BASIC METABOLIC PANEL
Anion gap: 9 (ref 5–15)
BUN: 75 mg/dL — ABNORMAL HIGH (ref 8–23)
CO2: 20 mmol/L — ABNORMAL LOW (ref 22–32)
Calcium: 8.9 mg/dL (ref 8.9–10.3)
Chloride: 107 mmol/L (ref 98–111)
Creatinine, Ser: 2.49 mg/dL — ABNORMAL HIGH (ref 0.44–1.00)
GFR, Estimated: 18 mL/min — ABNORMAL LOW (ref >60)
Glucose, Bld: 210 mg/dL — ABNORMAL HIGH (ref 70–99)
Potassium: 5.6 mmol/L — ABNORMAL HIGH (ref 3.5–5.1)
Sodium: 136 mmol/L (ref 135–145)

## 2023-06-04 LAB — CBC WITH DIFFERENTIAL/PLATELET
Abs Immature Granulocytes: 0.03 10*3/uL (ref 0.00–0.07)
Basophils Absolute: 0 10*3/uL (ref 0.0–0.1)
Basophils Relative: 0 %
Eosinophils Absolute: 0.3 10*3/uL (ref 0.0–0.5)
Eosinophils Relative: 3 %
HCT: 29.7 % — ABNORMAL LOW (ref 36.0–46.0)
Hemoglobin: 9 g/dL — ABNORMAL LOW (ref 12.0–15.0)
Immature Granulocytes: 0 %
Lymphocytes Relative: 32 %
Lymphs Abs: 2.8 10*3/uL (ref 0.7–4.0)
MCH: 29 pg (ref 26.0–34.0)
MCHC: 30.3 g/dL (ref 30.0–36.0)
MCV: 95.8 fL (ref 80.0–100.0)
Monocytes Absolute: 0.5 10*3/uL (ref 0.1–1.0)
Monocytes Relative: 6 %
Neutro Abs: 5.1 10*3/uL (ref 1.7–7.7)
Neutrophils Relative %: 59 %
Platelets: 248 10*3/uL (ref 150–400)
RBC: 3.1 MIL/uL — ABNORMAL LOW (ref 3.87–5.11)
RDW: 13.3 % (ref 11.5–15.5)
WBC: 8.7 10*3/uL (ref 4.0–10.5)
nRBC: 0 % (ref 0.0–0.2)

## 2023-06-04 LAB — I-STAT CHEM 8, ED
BUN: 79 mg/dL — ABNORMAL HIGH (ref 8–23)
BUN: 71 mg/dL — ABNORMAL HIGH (ref 8–23)
Calcium, Ion: 1.23 mmol/L (ref 1.15–1.40)
Calcium, Ion: 1.14 mmol/L — ABNORMAL LOW (ref 1.15–1.40)
Chloride: 105 mmol/L (ref 98–111)
Chloride: 109 mmol/L (ref 98–111)
Creatinine, Ser: 2.6 mg/dL — ABNORMAL HIGH (ref 0.44–1.00)
Creatinine, Ser: 2.6 mg/dL — ABNORMAL HIGH (ref 0.44–1.00)
Glucose, Bld: 259 mg/dL — ABNORMAL HIGH (ref 70–99)
Glucose, Bld: 257 mg/dL — ABNORMAL HIGH (ref 70–99)
HCT: 28 % — ABNORMAL LOW (ref 36.0–46.0)
HCT: 33 % — ABNORMAL LOW (ref 36.0–46.0)
Hemoglobin: 9.5 g/dL — ABNORMAL LOW (ref 12.0–15.0)
Hemoglobin: 11.2 g/dL — ABNORMAL LOW (ref 12.0–15.0)
Potassium: 6.6 mmol/L (ref 3.5–5.1)
Potassium: 7.2 mmol/L (ref 3.5–5.1)
Sodium: 136 mmol/L (ref 135–145)
Sodium: 137 mmol/L (ref 135–145)
TCO2: 22 mmol/L (ref 22–32)
TCO2: 21 mmol/L — ABNORMAL LOW (ref 22–32)

## 2023-06-04 LAB — RESP PANEL BY RT-PCR (RSV, FLU A&B, COVID)  RVPGX2
Influenza A by PCR: NEGATIVE
Influenza B by PCR: NEGATIVE
Resp Syncytial Virus by PCR: NEGATIVE
SARS Coronavirus 2 by RT PCR: NEGATIVE

## 2023-06-04 LAB — CBG MONITORING, ED: Glucose-Capillary: 206 mg/dL — ABNORMAL HIGH (ref 70–99)

## 2023-06-04 MED ORDER — OXYMETAZOLINE HCL 0.05 % NA SOLN: 1.0000 | Freq: Once | NASAL | Status: DC

## 2023-06-04 MED ORDER — ONDANSETRON 4 MG PO TBDP
8.0000 mg | ORAL_TABLET | Freq: Once | ORAL | Status: AC
  Administered 2023-06-04: 8 mg via ORAL
  Filled 2023-06-04: qty 2

## 2023-06-04 MED ORDER — MORPHINE SULFATE (PF) 2 MG/ML IV SOLN
2.0000 mg | Freq: Once | INTRAVENOUS | Status: AC
  Administered 2023-06-04: 2 mg via INTRAVENOUS
  Filled 2023-06-04: qty 1

## 2023-06-04 MED ORDER — DEXAMETHASONE SODIUM PHOSPHATE 10 MG/ML IJ SOLN
10.0000 mg | Freq: Once | INTRAMUSCULAR | Status: AC
  Administered 2023-06-04: 10 mg via INTRAVENOUS
  Filled 2023-06-04: qty 1

## 2023-06-04 MED ORDER — PROCHLORPERAZINE EDISYLATE 10 MG/2ML IJ SOLN
5.0000 mg | INTRAMUSCULAR | Status: AC
  Administered 2023-06-04: 5 mg via INTRAVENOUS
  Filled 2023-06-04: qty 2

## 2023-06-04 MED ORDER — ENOXAPARIN SODIUM 30 MG/0.3ML IJ SOSY: 30.0000 mg | PREFILLED_SYRINGE | Freq: Every day | INTRAMUSCULAR | Status: DC

## 2023-06-04 MED ORDER — HYDROCODONE-ACETAMINOPHEN 5-325 MG PO TABS
1.0000 | ORAL_TABLET | Freq: Once | ORAL | Status: AC
Start: 2023-06-04 — End: 2023-06-04
  Administered 2023-06-04: 1 via ORAL
  Filled 2023-06-04: qty 1

## 2023-06-04 MED ORDER — SODIUM ZIRCONIUM CYCLOSILICATE 10 G PO PACK
10.0000 g | PACK | Freq: Three times a day (TID) | ORAL | Status: AC
  Administered 2023-06-05 (×2): 10 g via ORAL
  Filled 2023-06-04 (×2): qty 1

## 2023-06-04 MED ORDER — INSULIN ASPART 100 UNIT/ML IV SOLN
10.0000 [IU] | INTRAVENOUS | Status: AC
  Administered 2023-06-05: 10 [IU] via INTRAVENOUS

## 2023-06-04 MED ORDER — LACTATED RINGERS IV BOLUS
500.0000 mL | Freq: Once | INTRAVENOUS | Status: AC
  Administered 2023-06-05: 500 mL via INTRAVENOUS

## 2023-06-04 MED ORDER — DEXTROSE 50 % IV SOLN
12.5000 g | INTRAVENOUS | Status: AC
  Administered 2023-06-05: 12.5 g via INTRAVENOUS
  Filled 2023-06-04: qty 50

## 2023-06-04 MED ORDER — FENTANYL CITRATE PF 50 MCG/ML IJ SOSY
25.0000 ug | PREFILLED_SYRINGE | Freq: Once | INTRAMUSCULAR | Status: AC
  Administered 2023-06-04: 25 ug via INTRAVENOUS
  Filled 2023-06-04: qty 1

## 2023-06-04 MED ORDER — SODIUM ZIRCONIUM CYCLOSILICATE 10 G PO PACK: 10.0000 g | PACK | ORAL | Status: DC

## 2023-06-04 MED ORDER — CEFAZOLIN SODIUM-DEXTROSE 1-4 GM/50ML-% IV SOLN
1.0000 g | Freq: Once | INTRAVENOUS | Status: DC
  Filled 2023-06-04: qty 50

## 2023-06-04 MED ORDER — FUROSEMIDE 10 MG/ML IJ SOLN
40.0000 mg | Freq: Once | INTRAMUSCULAR | Status: AC
  Administered 2023-06-05: 40 mg via INTRAVENOUS
  Filled 2023-06-04: qty 4

## 2023-06-04 MED ORDER — ACETAMINOPHEN 325 MG PO TABS
325.0000 mg | ORAL_TABLET | Freq: Once | ORAL | Status: AC
  Administered 2023-06-04: 325 mg via ORAL
  Filled 2023-06-04: qty 1

## 2023-06-04 MED ORDER — CALCIUM GLUCONATE-NACL 1-0.675 GM/50ML-% IV SOLN
1.0000 g | INTRAVENOUS | Status: AC
  Administered 2023-06-05: 1000 mg via INTRAVENOUS
  Filled 2023-06-04: qty 50

## 2023-06-04 NOTE — ED Triage Notes (Signed)
Pt's family states she had a mechanical fall last Tuesday, hit head on closet door; pt on eliquis; pt c/o severe HA and nausea; no neuro deficits, PERRLA, pt a and o x 4  Verbal consent given for MSE

## 2023-06-04 NOTE — H&P (Incomplete)
History and Physical  [name] LPF:[id number] DOB: [birth year] DOA: 06/04/2023  Referring physician: Dr. Rosalia Hammers, EDP  PCP: [name], MD  Outpatient Specialists: Cardiology, ENT, Podiatry. Patient coming from: Home  Chief Complaint: Fall, headache and nausea.   HPI: [name] is a 89 y.o. female with medical history significant for obesity, type 2 diabetes, hypertension, history of vertigo, CKD 4, anemia of chronic disease, DVT on Eliquis, chronic HFpEF, who presents to the ER with complaints of headache that began after a fall 6 days ago.  She tripped and fell to the ground striking the right side of her head on the closet door.  Did not lose consciousness.  Associated with nausea without vomiting.  She presented to the ER for further evaluation.  In the ER, vital signs are stable.  Noncontrast head CT showed no intracranial abnormality.  CT cervical spine without contrast was also nonacute.  Lab studies were notable for hyperkalemia with serum potassium of 7.2, elevated creatinine above baseline.  The patient received IV fluid bolus 500 cc LR x 1, IV Lasix 40 mg x 1 and 10 g Lokelma in the ED.  Also received IV opioid-based analgesics.  TRH, hospitalist service, was asked to admit for management of hyperkalemia and AKI.  ED Course: Temperature 97.5.  BP 152/71, pulse 82, respiratory 16, O2 saturation 100% on room air.  Lab studies notable for serum sodium 132, potassium 7.2, glucose 257, BUN 71, creatinine 2.60, GFR is 19.  WBC 12.4, hemoglobin 11.2, platelet 238.  Review of Systems: Review of systems as noted in the HPI. All other systems reviewed and are negative.   Past Medical History:  Diagnosis Date   Abdominal pain 01/26/2012   Acute deep vein thrombosis (DVT) of femoral vein of right lower extremity (HCC) 03/26/2022   Acute kidney injury superimposed on chronic kidney disease (HCC) 12/18/2014   Acute upper GI bleed 03/26/2022   Allergy    takes Mucinex daily as  needed   Anemia, unspecified    Anxiety    takes Clonazepam daily as needed   Anxiety associated with depression 06/08/2017   Arthritis    Breast cancer (HCC)    Breast cancer screening 02/19/2014   Breast mass 10/27/2014   Chronic diastolic CHF (congestive heart failure) (HCC)    takes Furosemide daily   Chronic pain syndrome 02/06/2015   CKD (chronic kidney disease), stage III (HCC)    Coronary artery disease    a. s/p IMI 2004 tx with BMS to RCA;  b. s/p Promus DES to RCA 2/12 c. abnormal nuc 2016 -> cath with med rx. d. NSTEMI 02/2020  mv CAD with severe ISR in pRCA and m/dRCA lesion both treated with DES.   Debility 08/01/2012   Dementia (HCC)    Depression    takes Cymbalta daily   Diabetes mellitus    insulin daily   DOE (dyspnea on exertion) 06/24/2014   Dysphagia, unspecified(787.20) 07/31/2012   Fungal dermatitis 11/13/2007   Qualifier: Diagnosis of  By: Amador Cunas  MD, Janett Labella    GERD (gastroesophageal reflux disease)    takes Protonix daily   Gout, unspecified    Headache    occasionally   History of anemia due to chronic kidney disease 03/26/2022   History of blood clots    History of deep venous thrombosis 01/27/2012   History of pulmonary embolus (PE) 04/22/2014   Hyperkalemia 07/26/2017   Hyperlipidemia    takes Pravastatin daily   Hypertension  Insomnia    Joint pain    Joint swelling    Morbid obesity (HCC)    Multiple benign nevi 11/15/2016   Myocardial infarction [facility]) 2004   Non-STEMI (non-ST elevated myocardial infarction) (HCC) 02/15/2020   Numbness    Obstructive sleep apnea    does not wear cpap   Osteoarthritis    Osteoarthritis    Osteoarthrosis, unspecified whether generalized or localized, lower leg    Pain in the chest 12/31/2013   Pain, chronic    Personal history of other diseases of the digestive system 12/03/2006   Qualifier: Diagnosis of  By: Amador Cunas  MD, Janett Labella    Polymyalgia rheumatica [facility])    Presence of stent in  right coronary artery    Pulmonary emboli (HCC) 01/2012   felt to need lifelong anticoagulation but Xarelto dc in 02/2020 due to need for DAPT   Situational depression 06/04/2009   Qualifier: Diagnosis of  By: Amador Cunas  MD, Janett Labella    Syncope, vasovagal 07/04/2021   Type 2 diabetes mellitus with hyperlipidemia (HCC) 02/16/2022   Urinary incontinence    takes Linzess daily   Vaginitis and vulvovaginitis 06/23/2016   Vertigo    hx of;was taking Meclizine if needed   Wears dentures    Past Surgical History:  Procedure Laterality Date   ABDOMINAL HYSTERECTOMY     partial   APPENDECTOMY     blood clots/legs and lungs  2013   BREAST BIOPSY Left 07/22/2014   BREAST BIOPSY Left 02/10/2013   BREAST LUMPECTOMY Left 11/05/2014   BREAST LUMPECTOMY WITH RADIOACTIVE SEED LOCALIZATION Left 11/05/2014   Procedure: LEFT BREAST LUMPECTOMY WITH RADIOACTIVE SEED LOCALIZATION;  Surgeon: Abigail Miyamoto, MD;  Location: [facility] OR;  Service: General;  Laterality: Left;   CARDIAC CATHETERIZATION     COLONOSCOPY     CORONARY ANGIOPLASTY  2   CORONARY STENT INTERVENTION N/A 02/17/2020   Procedure: CORONARY STENT INTERVENTION;  Surgeon: Marykay Lex, MD;  Location: [facility] INVASIVE CV LAB;  Service: Cardiovascular;  Laterality: N/A;   ESOPHAGOGASTRODUODENOSCOPY (EGD) WITH PROPOFOL N/A 11/07/2016   Procedure: ESOPHAGOGASTRODUODENOSCOPY (EGD) WITH PROPOFOL;  Surgeon: Iva Boop, MD;  Location: [facility] ENDOSCOPY;  Service: Endoscopy;  Laterality: N/A;   ESOPHAGOGASTRODUODENOSCOPY (EGD) WITH PROPOFOL N/A 01/05/2023   Procedure: ESOPHAGOGASTRODUODENOSCOPY (EGD) WITH PROPOFOL;  Surgeon: Hilarie Fredrickson, MD;  Location: [facility] ENDOSCOPY;  Service: Gastroenterology;  Laterality: N/A;   EXCISION OF SKIN TAG Right 11/05/2014   Procedure: EXCISION OF RIGHT EYELID SKIN TAG;  Surgeon: Abigail Miyamoto, MD;  Location: [facility] OR;  Service: General;  Laterality: Right;   EYE SURGERY Bilateral    cataract    GASTRIC BYPASS  1977     reversed in 1979, [facility]   LEFT HEART CATH AND CORONARY ANGIOGRAPHY N/A 02/17/2020   Procedure: LEFT HEART CATH AND CORONARY ANGIOGRAPHY;  Surgeon: Marykay Lex, MD;  Location: [facility] INVASIVE CV LAB;  Service: Cardiovascular;  Laterality: N/A;   LEFT HEART CATHETERIZATION WITH CORONARY ANGIOGRAM N/A 06/29/2014   Procedure: LEFT HEART CATHETERIZATION WITH CORONARY ANGIOGRAM;  Surgeon: Lennette Bihari, MD;  Location: [facility] CATH LAB;  Service: Cardiovascular;  Laterality: N/A;   MEMBRANE PEEL Right 10/23/2018   Procedure: MEMBRANE PEEL;  Surgeon: Edmon Crape, MD;  Location: [facility] OR;  Service: Ophthalmology;  Laterality: Right;   MI with stent placement  2004   PARS PLANA VITRECTOMY Right 10/23/2018   Procedure: PARS PLANA VITRECTOMY WITH 25 GAUGE;  Surgeon: Edmon Crape, MD;  Location:  [facility] OR;  Service: Ophthalmology;  Laterality: Right;    Social History:  reports that she has never smoked. She has been exposed to tobacco smoke. She has never used smokeless tobacco. She reports that she does not drink alcohol and does not use drugs.   Allergies  Allergen Reactions   Sulfonamide Derivatives Swelling and Other (See Comments)    Mouth swelling- no resp issues noted   Duloxetine     Headaches   Lokelma [Sodium Zirconium Cyclosilicate] Other (See Comments)    Headache, stomach upset   Aricept [Donepezil Hcl] Diarrhea, Nausea And Vomiting and Other (See Comments)    GI upset/loose stools   Tramadol Nausea And Vomiting    Family History  Problem Relation Age of Onset   Breast cancer Mother 38   Heart disease Mother    Throat cancer Father    Hypertension Father    Arthritis Father    Diabetes Father    Arthritis Sister    Obesity Sister    Diabetes Sister    Heart disease Cousin    Colon cancer Neg Hx    Stomach cancer Neg Hx    Esophageal cancer Neg Hx       Prior to Admission medications   Medication Sig Start Date End Date Taking? Authorizing Provider  albuterol (PROAIR  HFA) 108 (90 Base) MCG/ACT inhaler Inhale 1-2 puffs into the lungs every 6 (six) hours as needed for wheezing or shortness of breath. 12/19/18   Sharon Seller, NP  amitriptyline (ELAVIL) 75 MG tablet Take 1 tablet (75 mg total) by mouth at bedtime. 02/22/23 05/23/23  [name], MD  amoxicillin-clavulanate (AUGMENTIN) 875-125 MG tablet Take 1 tablet by mouth 2 (two) times daily. 04/09/23   [name], MD  atorvastatin (LIPITOR) 80 MG tablet TAKE 1 TABLET BY MOUTH EVERY DAY Patient taking differently: Take 80 mg by mouth at bedtime. 12/04/22   [name], MD  azelastine (ASTELIN) 0.1 % nasal spray Place 2 sprays into both nostrils 2 (two) times daily. 01/23/23   [name], MD  benzonatate (TESSALON) 100 MG capsule Take 1 capsule (100 mg total) by mouth 2 (two) times daily as needed for cough. 09/05/22   [name], MD  carvedilol (COREG) 12.5 MG tablet TAKE 1 TABLET BY MOUTH 2 TIMES DAILY 05/17/23   [name], MD  cetirizine (ZYRTEC) 10 MG tablet Take 10 mg by mouth daily as needed for allergies or rhinitis.    [provider]  Continuous Glucose Sensor (FREESTYLE LIBRE 14 DAY SENSOR) MISC PLACE 1 DEVICE ON THE SKIN AS DIRECTED EVERY 14 DAYS Patient taking differently: Inject 1 Device into the skin every 14 (fourteen) days. 11/27/22   Shamleffer, Konrad Dolores, MD  diclofenac Sodium (VOLTAREN) 1 % GEL Apply 2 g topically 4 (four) times daily. Patient taking differently: Apply 2 g topically 4 (four) times daily as needed (pain). 02/23/22   [name], MD  docusate sodium (COLACE) 100 MG capsule Take 2 capsules (200 mg total) by mouth daily. 12/19/22 12/14/23  [name], MD  Elastic Bandages & Supports (B & B CARPAL TUNNEL BRACE) MISC Use brace on wrist as needed for wrist pain 04/24/22   [name], MD  ELIQUIS 5 MG TABS tablet TAKE 1 TABLET BY MOUTH 2 TIMES DAILY 04/09/23   [name], MD  febuxostat (ULORIC) 40 MG tablet TAKE  1 TABLET BY MOUTH EVERY DAY 02/22/23   Fanny Bien  B, MD  fluticasone (FLONASE) 50 MCG/ACT nasal spray Place 2 sprays into both nostrils daily for 14 days. Patient taking differently: Place 2 sprays into both nostrils every other day. 11/29/22 01/03/23  Salvatore Decent, FNP  Fluticasone-Umeclidin-Vilant (TRELEGY ELLIPTA) 100-62.5-25 MCG/ACT AEPB Inhale 1 puff into the lungs daily. 01/10/23 01/05/24  [name], MD  folic acid (FOLVITE) 1 MG tablet TAKE 1 TABLET BY MOUTH EVERY DAY 05/17/23   [name], MD  glucagon 1 MG injection Inject 1 mg into the vein once as needed for up to 1 dose. 04/13/22   [name], MD  glucose 4 GM chewable tablet Chew 1 tablet (4 g total) by mouth as needed for low blood sugar. 04/05/22   [name], MD  hydrOXYzine (ATARAX) 25 MG tablet Take 1 tablet (25 mg total) by mouth every 8 (eight) hours as needed for anxiety or itching (nasal and throat congetion). 02/22/23   [name], MD  insulin glargine, 2 Unit Dial, (TOUJEO MAX SOLOSTAR) 300 UNIT/ML Solostar Pen Inject 40 Units into the skin daily. Patient assistance program provides. Patient reports 40 units 04/24/2022 Patient taking differently: Inject 34 Units into the skin daily before breakfast. 05/31/22   Shamleffer, Konrad Dolores, MD  ipratropium-albuterol (DUONEB) 0.5-2.5 (3) MG/3ML SOLN Take 3 mLs by nebulization every 6 (six) hours as needed (cough, wheezing, or shortness of breath). 01/10/23 04/10/23  [name], MD  linaclotide Karlene Einstein) 145 MCG CAPS capsule Patient take 1 capsule daily.  May add an additional capsule for breakthrough constipation associated with opioid use Patient taking differently: Take 145 mcg by mouth every other day. 12/19/22   [name], MD  meclizine (ANTIVERT) 25 MG tablet Take 1-2 tablets (25-50 mg total) by mouth 2 (two) times daily as needed for dizziness. 01/26/23   [name], MD  memantine (NAMENDA) 5 MG tablet TAKE 2 TABLETS BY MOUTH  EVERY MORNING and TAKE 1 TABLET BY MOUTH EVERY EVENING Patient taking differently: Take 5-10 mg by mouth See admin instructions. Take 10 mg by mouth in the morning and 5 mg in the evening 10/10/22   [name], MD  montelukast (SINGULAIR) 10 MG tablet Take 1 tablet (10 mg total) by mouth at bedtime. 04/24/22   [name], MD  nitroGLYCERIN (NITROSTAT) 0.4 MG SL tablet Take one tablet under the tongue every 5 minutes as needed for chest pain Patient taking differently: Place 0.4 mg under the tongue every 5 (five) minutes as needed for chest pain. 09/24/13   Sharon Seller, NP  NOVOLOG FLEXPEN 100 UNIT/ML FlexPen Inject 14 Units into the skin 2 (two) times daily with a meal. Max daily dose 70 units 04/03/22   Shamleffer, Konrad Dolores, MD  nystatin (MYCOSTATIN/NYSTOP) powder Apply 1 Application topically 3 (three) times daily. 03/21/23   Salvatore Decent, FNP  ondansetron (ZOFRAN) 4 MG tablet Take 1 tablet (4 mg total) by mouth every 8 (eight) hours as needed for nausea or vomiting. 03/14/22   [name], MD  oxyCODONE-acetaminophen (PERCOCET) 7.5-325 MG tablet Take 1 tablet by mouth 2 (two) times daily as needed (for pain).    [provider]  pantoprazole (PROTONIX) 20 MG tablet TAKE 1 TABLET BY MOUTH EVERY DAY 04/19/23   [name], MD  Semaglutide,0.25 or 0.5MG /DOS, (OZEMPIC, 0.25 OR 0.5 MG/DOSE,) 2 MG/3ML SOPN Inject 0.5 mg into the skin once a week. Patient taking differently: Inject 0.5 mg into the skin every Monday. 08/21/22  Shamleffer, Konrad Dolores, MD  sodium polystyrene (SPS) 15 GM/60ML suspension TAKE 60 MLS BY MOUTH ONCE WEEKLY TO KEEP POTASSIUM DOWN 03/09/23   [name], MD  torsemide (DEMADEX) 20 MG tablet TAKE 1 TABLET BY MOUTH ONCE DAILY - MAY TAKE AN EXTRA DOES FOR WEIGHT GAIN OF 3LBs IN 1 DAY OR 5LBS IN 1 WEEK OR FOR WORSENING SWELLING 05/17/23   [name], MD  TYLENOL 500 MG tablet Take 1,000 mg by mouth every 6 (six) hours as  needed for mild pain or headache.    [provider]  TYLENOL PM EXTRA STRENGTH 500-25 MG TABS tablet Take 2 tablets by mouth at bedtime.    [provider]  Vitamin D, Ergocalciferol, (DRISDOL) 1.25 MG (50000 UNIT) CAPS capsule TAKE 1 CAPSULE BY MOUTH ONCE WEEKLY 04/19/23   [name], MD    Physical Exam: BP (!) 147/62   Pulse 65   Temp (!) 97.5 F (36.4 C) (Oral)   Resp 16   SpO2 100%   General: 89 y.o. year-old female well developed well nourished in no acute distress.  Alert and interactive. Cardiovascular: Regular rate and rhythm with no rubs or gallops.  No thyromegaly or JVD noted.  Trace lower extremity edema bilaterally. Respiratory: Clear to auscultation with no wheezes or rales. Good inspiratory effort. Abdomen: Soft nontender nondistended with normal bowel sounds x4 quadrants. Muskuloskeletal: No cyanosis or clubbing noted bilaterally Neuro: CN II-XII intact, strength, sensation, reflexes Skin: No ulcerative lesions noted or rashes Psychiatry: Mood is appropriate for condition and setting          Labs on Admission:  Basic Metabolic Panel: Recent Labs  Lab 06/04/23 1315 06/04/23 2134 06/04/23 2238  NA 136 136 137  K 5.6* 6.6* 7.2*  CL 107 105 109  CO2 20*  --   --   GLUCOSE 210* 259* 257*  BUN 75* 79* 71*  CREATININE 2.49* 2.60* 2.60*  CALCIUM 8.9  --   --    Liver Function Tests: No results for input(s): "AST", "ALT", "ALKPHOS", "BILITOT", "PROT", "ALBUMIN" in the last 168 hours. No results for input(s): "LIPASE", "AMYLASE" in the last 168 hours. No results for input(s): "AMMONIA" in the last 168 hours. CBC: Recent Labs  Lab 06/04/23 1315 06/04/23 2134 06/04/23 2238  WBC 8.7  --   --   NEUTROABS 5.1  --   --   HGB 9.0* 9.5* 11.2*  HCT 29.7* 28.0* 33.0*  MCV 95.8  --   --   PLT 248  --   --    Cardiac Enzymes: No results for input(s): "CKTOTAL", "CKMB", "CKMBINDEX", "TROPONINI" in the last 168 hours.  BNP (last 3  results) No results for input(s): "BNP" in the last 8760 hours.  ProBNP (last 3 results) No results for input(s): "PROBNP" in the last 8760 hours.  CBG: Recent Labs  Lab 06/04/23 1603  GLUCAP 206*    Radiological Exams on Admission: CT Cervical Spine Wo Contrast Result Date: 06/04/2023 CLINICAL DATA:  Neck trauma.  Fall 6 days ago with head injury. EXAM: CT CERVICAL SPINE WITHOUT CONTRAST TECHNIQUE: Multidetector CT imaging of the cervical spine was performed without intravenous contrast. Multiplanar CT image reconstructions were also generated. RADIATION DOSE REDUCTION: This exam was performed according to the departmental dose-optimization program which includes automated exposure control, adjustment of the mA and/or kV according to patient size and/or use of iterative reconstruction technique. COMPARISON:  Cervical spine radiographs 02/02/2020. FINDINGS: Alignment: Normal. Skull base and vertebrae:  No evidence of acute fracture or traumatic subluxation. Soft tissues and spinal canal: No prevertebral fluid or swelling. No visible canal hematoma. Disc levels: The cervical disc heights are relatively maintained. There is mild multilevel spondylosis with disc bulging, uncinate spurring and facet hypertrophy. No large disc herniation or high-grade spinal stenosis demonstrated. Upper chest: Clear lung apices. Other: Bilateral carotid atherosclerosis. IMPRESSION: 1. No evidence of acute cervical spine fracture, traumatic subluxation or static signs of instability. 2. Mild multilevel cervical spondylosis. Electronically Signed   By: Carey Bullocks M.D.   On: 06/04/2023 14:49   CT Head Wo Contrast Result Date: 06/04/2023 CLINICAL DATA:  Head trauma, minor (Age >= 65y) EXAM: CT HEAD WITHOUT CONTRAST TECHNIQUE: Contiguous axial images were obtained from the base of the skull through the vertex without intravenous contrast. RADIATION DOSE REDUCTION: This exam was performed according to the departmental  dose-optimization program which includes automated exposure control, adjustment of the mA and/or kV according to patient size and/or use of iterative reconstruction technique. COMPARISON:  09/12/2022 FINDINGS: Brain: No evidence of acute infarction, hemorrhage, hydrocephalus, extra-axial collection or mass lesion/mass effect. Patchy low-density changes within the periventricular and subcortical white matter most compatible with chronic microvascular ischemic change. Mild diffuse cerebral volume loss. Vascular: Atherosclerotic calcifications involving the large vessels of the skull base. No unexpected hyperdense vessel. Skull: Normal. Negative for fracture or focal lesion. Sinuses/Orbits: Mucosal thickening of the right frontal sinus and within the anterior right ethmoid air cells. Otherwise clear. Other: Negative for scalp hematoma. IMPRESSION: 1. No acute intracranial findings. 2. Chronic microvascular ischemic change and cerebral volume loss. Electronically Signed   By: Duanne Guess D.O.   On: 06/04/2023 14:48    EKG: I independently viewed the EKG done and my findings are as followed: Sinus rhythm rate of 57.  Nonspecific ST-T density QTc 455.  Assessment/Plan Present on Admission:  Hyperkalemia  Active Problems:   Hyperkalemia  Hyperkalemia in the setting of renal insufficiency Presented with serum potassium 7.2, no peaked T waves seen on twelve-lead EKG In the ED, received LR 500 cc x 1, IV Lasix 40 mg x 1, Lokelma 10 g x 1 Added IV calcium gluconate 1 g x 1, IV insulin 10 units x 1, D50 12.5 g x 1, Lokelma 10 g 3 times daily x 3 doses. Repeat serum potassium in the morning Monitor on telemetry  AKI on CKD 4, likely prerenal in the setting of dehydration from poor oral intake At baseline creatinine is 2.1 with GFR of 22 Presented with creatinine of 2.60 Avoid nephrotoxic agents, dehydration, and hypotension Gentle IV fluid hydration NS at 50 cc/h x 1 day Monitor urine output Repeat  BMP in the morning.  Type 2 diabetes with hyperglycemia Last hemoglobin A1c 7.2 on 01/03/2023. Presented with serum glucose 259 Heart healthy carb modified diet Start insulin sliding scale.  Leukocytosis, rule out active infective process WBC 12.4 Follow UA No upper respiratory symptoms with O2 saturation of 100% on room air.  History of asthma No acute issues Resume home Singulair, home regimen  Hyperlipidemia Resume home Lipitor  History of DVT Resume home Eliquis  Anemia of chronic disease Hemoglobin stable No reported overt bleeding  Recent fall PT OT assessment Fall precautions.  Obesity Weight 123 kg Recommend weight loss outpatient with regular physical activity and healthy dieting.  Chronic HFpEF Euvolemic on exam Last 2D echo done on 07/05/2021 revealed LVEF 55 to 60% with grade 1 diastolic dysfunction Monitor strict I's and O's and daily weight  Time: 75 minutes.   DVT prophylaxis: On Eliquis  Code Status: DNR  Family Communication: Daughter at bedside  Disposition Plan: Admitted to telemetry medical unit  Consults called: None  Admission status: Observation status   Status is: Observation    Darlin Drop MD [facility] Pager [phone]  If 7PM-7AM, please contact night-coverage [email or web address] Password [facility]  06/04/2023, 11:30 PM

## 2023-06-04 NOTE — ED Provider Triage Note (Signed)
Emergency Medicine Provider Triage Evaluation Note  KENDA KLOEHN , a 89 y.o. female  was evaluated in triage.  Pt complains of Fall last week takes thinner, hit left side of head.  Review of Systems  Positive: HA, nausea, dizziness Negative: V/D, weakness, abd pain, SHOB, CP  Physical Exam  There were no vitals taken for this visit. Gen:   Awake, no distress   Resp:  Normal effort  MSK:   Moves extremities without difficulty  Other:  No battle signs/racoon eyes or obvious deformity  Medical Decision Making  Medically screening exam initiated at 1:12 PM.  Appropriate orders placed.  MARCELLENE SHIVLEY was informed that the remainder of the evaluation will be completed by another provider, this initial triage assessment does not replace that evaluation, and the importance of remaining in the ED until their evaluation is complete.  Imaging and labs ordered   Gretta Began 06/04/23 1314

## 2023-06-04 NOTE — ED Provider Notes (Signed)
[place] EMERGENCY DEPARTMENT AT [facility] Provider Note   CSN: [id number] Arrival date & time: 06/04/23  1303     History  Chief Complaint  Patient presents with   Fall   Headache    Kei CHESSICA AUDIA is a 89 y.o. female.  HPI  89 year old female history of hypertension, chronic anemia, DVT, on Eliquis, vertigo, type 2 diabetes, presents today complaining of headache that began 6 days ago after fall.  She was trying to get out of bed.  Her feet "got tangled up and she fell to the ground striking the right side of her head on the closet door.  She did not lose consciousness.  She began having a headache to the left side of her head after that.  She states that these began right away after the fall.  However the headache has worsened.  She has taken acetaminophen without relief.  She states she used to take oxycodone but it makes her sick and she can no longer take it.  She was given hydrocodone here in the ED but states that her pain continues.  She also endorses some nasal congestion and ear congestion.  She denies fever, chills, or other injury.  CT head obtained prior to my evaluation shows no acute intracranial findings  Home Medications Prior to Admission medications   Medication Sig Start Date End Date Taking? Authorizing Provider  albuterol (PROAIR HFA) 108 (90 Base) MCG/ACT inhaler Inhale 1-2 puffs into the lungs every 6 (six) hours as needed for wheezing or shortness of breath. 12/19/18   Sharon Seller, NP  amitriptyline (ELAVIL) 75 MG tablet Take 1 tablet (75 mg total) by mouth at bedtime. 02/22/23 05/23/23  [name], MD  amoxicillin-clavulanate (AUGMENTIN) 875-125 MG tablet Take 1 tablet by mouth 2 (two) times daily. 04/09/23   [name], MD  atorvastatin (LIPITOR) 80 MG tablet TAKE 1 TABLET BY MOUTH EVERY DAY Patient taking differently: Take 80 mg by mouth at bedtime. 12/04/22   [name], MD  azelastine (ASTELIN) 0.1 % nasal spray  Place 2 sprays into both nostrils 2 (two) times daily. 01/23/23   [name], MD  benzonatate (TESSALON) 100 MG capsule Take 1 capsule (100 mg total) by mouth 2 (two) times daily as needed for cough. 09/05/22   [name], MD  carvedilol (COREG) 12.5 MG tablet TAKE 1 TABLET BY MOUTH 2 TIMES DAILY 05/17/23   [name], MD  cetirizine (ZYRTEC) 10 MG tablet Take 10 mg by mouth daily as needed for allergies or rhinitis.    [provider]  Continuous Glucose Sensor (FREESTYLE LIBRE 14 DAY SENSOR) MISC PLACE 1 DEVICE ON THE SKIN AS DIRECTED EVERY 14 DAYS Patient taking differently: Inject 1 Device into the skin every 14 (fourteen) days. 11/27/22   Shamleffer, Konrad Dolores, MD  diclofenac Sodium (VOLTAREN) 1 % GEL Apply 2 g topically 4 (four) times daily. Patient taking differently: Apply 2 g topically 4 (four) times daily as needed (pain). 02/23/22   [name], MD  docusate sodium (COLACE) 100 MG capsule Take 2 capsules (200 mg total) by mouth daily. 12/19/22 12/14/23  [name], MD  Elastic Bandages & Supports (B & B CARPAL TUNNEL BRACE) MISC Use brace on wrist as needed for wrist pain 04/24/22   [name], MD  ELIQUIS 5 MG TABS tablet TAKE 1 TABLET BY MOUTH 2 TIMES DAILY 04/09/23   [name], MD  febuxostat Thyra Breed)  40 MG tablet TAKE 1 TABLET BY MOUTH EVERY DAY 02/22/23   [name], MD  fluticasone [facility]) 50 MCG/ACT nasal spray Place 2 sprays into both nostrils daily for 14 days. Patient taking differently: Place 2 sprays into both nostrils every other day. 11/29/22 01/03/23  Salvatore Decent, FNP  Fluticasone-Umeclidin-Vilant (TRELEGY ELLIPTA) 100-62.5-25 MCG/ACT AEPB Inhale 1 puff into the lungs daily. 01/10/23 01/05/24  [name], MD  folic acid (FOLVITE) 1 MG tablet TAKE 1 TABLET BY MOUTH EVERY DAY 05/17/23   [name], MD  glucagon 1 MG injection Inject 1 mg into the vein once as needed for up to 1 dose. 04/13/22   [name], MD  glucose 4 GM chewable tablet Chew 1 tablet (4 g total) by mouth as needed for low blood sugar. 04/05/22   [name], MD  hydrOXYzine (ATARAX) 25 MG tablet Take 1 tablet (25 mg total) by mouth every 8 (eight) hours as needed for anxiety or itching (nasal and throat congetion). 02/22/23   [name], MD  insulin glargine, 2 Unit Dial, (TOUJEO MAX SOLOSTAR) 300 UNIT/ML Solostar Pen Inject 40 Units into the skin daily. Patient assistance program provides. Patient reports 40 units 04/24/2022 Patient taking differently: Inject 34 Units into the skin daily before breakfast. 05/31/22   Shamleffer, Konrad Dolores, MD  ipratropium-albuterol (DUONEB) 0.5-2.5 (3) MG/3ML SOLN Take 3 mLs by nebulization every 6 (six) hours as needed (cough, wheezing, or shortness of breath). 01/10/23 04/10/23  [name], MD  linaclotide [facility] Einstein) 145 MCG CAPS capsule Patient take 1 capsule daily.  May add an additional capsule for breakthrough constipation associated with opioid use Patient taking differently: Take 145 mcg by mouth every other day. 12/19/22   [name], MD  meclizine (ANTIVERT) 25 MG tablet Take 1-2 tablets (25-50 mg total) by mouth 2 (two) times daily as needed for dizziness. 01/26/23   [name], MD  memantine (NAMENDA) 5 MG tablet TAKE 2 TABLETS BY MOUTH EVERY MORNING and TAKE 1 TABLET BY MOUTH EVERY EVENING Patient taking differently: Take 5-10 mg by mouth See admin instructions. Take 10 mg by mouth in the morning and 5 mg in the evening 10/10/22   [name], MD  montelukast (SINGULAIR) 10 MG tablet Take 1 tablet (10 mg total) by mouth at bedtime. 04/24/22   [name], MD  nitroGLYCERIN (NITROSTAT) 0.4 MG SL tablet Take one tablet under the tongue every 5 minutes as needed for chest pain Patient taking differently: Place 0.4 mg under the tongue every 5 (five) minutes as needed for chest pain. 09/24/13   Sharon Seller, NP  NOVOLOG FLEXPEN 100  UNIT/ML FlexPen Inject 14 Units into the skin 2 (two) times daily with a meal. Max daily dose 70 units 04/03/22   Shamleffer, Konrad Dolores, MD  nystatin (MYCOSTATIN/NYSTOP) powder Apply 1 Application topically 3 (three) times daily. 03/21/23   Salvatore Decent, FNP  ondansetron (ZOFRAN) 4 MG tablet Take 1 tablet (4 mg total) by mouth every 8 (eight) hours as needed for nausea or vomiting. 03/14/22   [name], MD  oxyCODONE-acetaminophen (PERCOCET) 7.5-325 MG tablet Take 1 tablet by mouth 2 (two) times daily as needed (for pain).    [provider]  pantoprazole (PROTONIX) 20 MG tablet TAKE 1 TABLET BY MOUTH EVERY DAY 04/19/23   [name], MD  Semaglutide,0.25 or 0.5MG /DOS, (OZEMPIC, 0.25 OR 0.5 MG/DOSE,) 2 MG/3ML SOPN Inject 0.5 mg into the skin  once a week. Patient taking differently: Inject 0.5 mg into the skin every Monday. 08/21/22   Shamleffer, Konrad Dolores, MD  sodium polystyrene (SPS) 15 GM/60ML suspension TAKE 60 MLS BY MOUTH ONCE WEEKLY TO KEEP POTASSIUM DOWN 03/09/23   [name], MD  torsemide (DEMADEX) 20 MG tablet TAKE 1 TABLET BY MOUTH ONCE DAILY - MAY TAKE AN EXTRA DOES FOR WEIGHT GAIN OF 3LBs IN 1 DAY OR 5LBS IN 1 WEEK OR FOR WORSENING SWELLING 05/17/23   [name], MD  TYLENOL 500 MG tablet Take 1,000 mg by mouth every 6 (six) hours as needed for mild pain or headache.    [provider]  TYLENOL PM EXTRA STRENGTH 500-25 MG TABS tablet Take 2 tablets by mouth at bedtime.    [provider]  Vitamin D, Ergocalciferol, (DRISDOL) 1.25 MG (50000 UNIT) CAPS capsule TAKE 1 CAPSULE BY MOUTH ONCE WEEKLY 04/19/23   [name], MD      Allergies    Sulfonamide derivatives, Duloxetine, Lokelma [sodium zirconium cyclosilicate], Aricept [donepezil hcl], and Tramadol    Review of Systems   Review of Systems  Physical Exam Updated Vital Signs BP (!) 147/62   Pulse 65   Temp (!) 97.5 F (36.4 C) (Oral)   Resp 16   SpO2  100%  Physical Exam Vitals and nursing note reviewed.  HENT:     Head: Normocephalic.     Mouth/Throat:     Mouth: Mucous membranes are moist.  Eyes:     Extraocular Movements: Extraocular movements intact.  Cardiovascular:     Rate and Rhythm: Normal rate and regular rhythm.  Pulmonary:     Effort: Pulmonary effort is normal.     Breath sounds: Normal breath sounds.  Abdominal:     Palpations: Abdomen is soft.  Musculoskeletal:        General: Normal range of motion.     Cervical back: Normal range of motion.  Skin:    General: Skin is warm.  Neurological:     Mental Status: She is alert.     Cranial Nerves: No cranial nerve deficit.     Sensory: No sensory deficit.     Motor: No weakness.  Psychiatric:        Mood and Affect: Mood normal.        Behavior: Behavior normal.     ED Results / Procedures / Treatments   Labs (all labs ordered are listed, but only abnormal results are displayed) Labs Reviewed  BASIC METABOLIC PANEL - Abnormal; Notable for the following components:      Result Value   Potassium 5.6 (*)    CO2 20 (*)    Glucose, Bld 210 (*)    BUN 75 (*)    Creatinine, Ser 2.49 (*)    GFR, Estimated 18 (*)    All other components within normal limits  CBC WITH DIFFERENTIAL/PLATELET - Abnormal; Notable for the following components:   RBC 3.10 (*)    Hemoglobin 9.0 (*)    HCT 29.7 (*)    All other components within normal limits  CBG MONITORING, ED - Abnormal; Notable for the following components:   Glucose-Capillary 206 (*)    All other components within normal limits  I-STAT CHEM 8, ED - Abnormal; Notable for the following components:   Potassium 6.6 (*)    BUN 79 (*)    Creatinine, Ser 2.60 (*)    Glucose, Bld 259 (*)    Hemoglobin 9.5 (*)  HCT 28.0 (*)    All other components within normal limits  I-STAT CHEM 8, ED - Abnormal; Notable for the following components:   Potassium 7.2 (*)    BUN 71 (*)    Creatinine, Ser 2.60 (*)    Glucose,  Bld 257 (*)    Calcium, Ion 1.14 (*)    TCO2 21 (*)    Hemoglobin 11.2 (*)    HCT 33.0 (*)    All other components within normal limits  RESP PANEL BY RT-PCR (RSV, FLU A&B, COVID)  [id number]  I-STAT CHEM 8, ED    EKG EKG Interpretation Date/Time:  Monday June 04 2023 22:57:49 EST Ventricular Rate:  57 PR Interval:  228 QRS Duration:  106 QT Interval:  467 QTC Calculation: 455 R Axis:   0  Text Interpretation: Sinus rhythm Prolonged PR interval Abnormal R-wave progression, early transition No significant change since last tracing 03 January 2023 Confirmed by Margarita Grizzle [phone]) on 06/04/2023 11:00:05 PM  Radiology CT Cervical Spine Wo Contrast Result Date: 06/04/2023 CLINICAL DATA:  Neck trauma.  Fall 6 days ago with head injury. EXAM: CT CERVICAL SPINE WITHOUT CONTRAST TECHNIQUE: Multidetector CT imaging of the cervical spine was performed without intravenous contrast. Multiplanar CT image reconstructions were also generated. RADIATION DOSE REDUCTION: This exam was performed according to the departmental dose-optimization program which includes automated exposure control, adjustment of the mA and/or kV according to patient size and/or use of iterative reconstruction technique. COMPARISON:  Cervical spine radiographs 02/02/2020. FINDINGS: Alignment: Normal. Skull base and vertebrae: No evidence of acute fracture or traumatic subluxation. Soft tissues and spinal canal: No prevertebral fluid or swelling. No visible canal hematoma. Disc levels: The cervical disc heights are relatively maintained. There is mild multilevel spondylosis with disc bulging, uncinate spurring and facet hypertrophy. No large disc herniation or high-grade spinal stenosis demonstrated. Upper chest: Clear lung apices. Other: Bilateral carotid atherosclerosis. IMPRESSION: 1. No evidence of acute cervical spine fracture, traumatic subluxation or static signs of instability. 2. Mild multilevel cervical spondylosis.  Electronically Signed   By: Carey Bullocks M.D.   On: 06/04/2023 14:49   CT Head Wo Contrast Result Date: 06/04/2023 CLINICAL DATA:  Head trauma, minor (Age >= 65y) EXAM: CT HEAD WITHOUT CONTRAST TECHNIQUE: Contiguous axial images were obtained from the base of the skull through the vertex without intravenous contrast. RADIATION DOSE REDUCTION: This exam was performed according to the departmental dose-optimization program which includes automated exposure control, adjustment of the mA and/or kV according to patient size and/or use of iterative reconstruction technique. COMPARISON:  09/12/2022 FINDINGS: Brain: No evidence of acute infarction, hemorrhage, hydrocephalus, extra-axial collection or mass lesion/mass effect. Patchy low-density changes within the periventricular and subcortical white matter most compatible with chronic microvascular ischemic change. Mild diffuse cerebral volume loss. Vascular: Atherosclerotic calcifications involving the large vessels of the skull base. No unexpected hyperdense vessel. Skull: Normal. Negative for fracture or focal lesion. Sinuses/Orbits: Mucosal thickening of the right frontal sinus and within the anterior right ethmoid air cells. Otherwise clear. Other: Negative for scalp hematoma. IMPRESSION: 1. No acute intracranial findings. 2. Chronic microvascular ischemic change and cerebral volume loss. Electronically Signed   By: Duanne Guess D.O.   On: 06/04/2023 14:48    Procedures .Critical Care  Performed by: Margarita Grizzle, MD Authorized by: Margarita Grizzle, MD   Critical care provider statement:    Critical care time (minutes):  60   Critical care end time:  06/04/2023 11:24 PM   Critical care time  was exclusive of:  Separately billable procedures and treating other patients   Critical care was necessary to treat or prevent imminent or life-threatening deterioration of the following conditions:  Trauma and metabolic crisis   Critical care was time spent  personally by me on the following activities:  Development of treatment plan with patient or surrogate, discussions with consultants, evaluation of patient's response to treatment, examination of patient, ordering and review of laboratory studies, ordering and review of radiographic studies, ordering and performing treatments and interventions, pulse oximetry, re-evaluation of patient's condition and review of old charts     Medications Ordered in ED Medications  oxymetazoline (AFRIN) 0.05 % nasal spray 1 spray (1 spray Each Nare Patient Refused/Not Given 06/04/23 2133)  lactated ringers bolus 500 mL (has no administration in time range)  furosemide (LASIX) injection 40 mg (has no administration in time range)  sodium zirconium cyclosilicate (LOKELMA) packet 10 g (has no administration in time range)  HYDROcodone-acetaminophen (NORCO/VICODIN) 5-325 MG per tablet 1 tablet (1 tablet Oral Given 06/04/23 1322)  ondansetron (ZOFRAN-ODT) disintegrating tablet 8 mg (8 mg Oral Given 06/04/23 1323)  acetaminophen (TYLENOL) tablet 325 mg (325 mg Oral Given 06/04/23 1730)  dexamethasone (DECADRON) injection 10 mg (10 mg Intravenous Given 06/04/23 1846)  prochlorperazine (COMPAZINE) injection 5 mg (5 mg Intravenous Given 06/04/23 1846)  fentaNYL (SUBLIMAZE) injection 25 mcg (25 mcg Intravenous Given 06/04/23 1958)  morphine (PF) 2 MG/ML injection 2 mg (2 mg Intravenous Given 06/04/23 2131)    ED Course/ Medical Decision Making/ A&P Clinical Course as of 06/04/23 2324  Mon Jun 04, 2023  2018 CBC is reviewed and interpreted significant for anemia that is stable from prior [DR]  2018 Respiratory panel reviewed interpreted negative [DR]  2018 Basic metabolic panel significant for elevated BUN and creatinine that appears stable from first several prior Potassium is elevated at 5.6 and will redraw [DR]    Clinical Course User Index [DR] Margarita Grizzle, MD                                 Medical Decision  Making Amount and/or Complexity of Data Reviewed Radiology: ordered.  Risk OTC drugs. Prescription drug management.  89 year old female with fall 6 days ago presented today with headache.  She is on Eliquis.  She had a head CT is does not show any evidence of acute intracranial hemorrhage.  She has continued to have headache.  As part of her workup she had labs performed which are significant for acute kidney injury with creatinine increased to 2.49 from baseline of 0.14.  Potassium was elevated initially at 5.6 and then rechecked elevated at 6.6 and third draw 7.2.  Patient is receiving IV fluids, Lasix, Lokelma. EKG without acute ischemic changes. Review of records reveal previous episodes of hyperkalemia likely related to [facility] Patient evaluated for headache and head ct without acute abnormality. Multiple pain medications given with some relief. Headache is temporally related to fall- doubt other acute etiologies such as temporal arteritis, infection, - no mass or bleed on ct.  BP slightly elevated but would not be consistent with hypertensive urgency.   1- hyperkalemia 2- headache secondary to fall. 3-AKI 4- anemia- chronic, stable Discussed care with Dr. Margo Aye who will see for admission        Final Clinical Impression(s) / ED Diagnoses Final diagnoses:  Hyperkalemia    Rx / DC Orders ED Discharge Orders  None         Margarita Grizzle, MD 06/04/23 [phone]

## 2023-06-05 ENCOUNTER — Other Ambulatory Visit: Payer: Self-pay | Admitting: Family Medicine

## 2023-06-05 DIAGNOSIS — Z1152 Encounter for screening for COVID-19: Secondary | ICD-10-CM | POA: Diagnosis not present

## 2023-06-05 DIAGNOSIS — E1122 Type 2 diabetes mellitus with diabetic chronic kidney disease: Secondary | ICD-10-CM | POA: Diagnosis not present

## 2023-06-05 DIAGNOSIS — I6782 Cerebral ischemia: Secondary | ICD-10-CM | POA: Diagnosis not present

## 2023-06-05 DIAGNOSIS — Z7901 Long term (current) use of anticoagulants: Secondary | ICD-10-CM | POA: Diagnosis not present

## 2023-06-05 DIAGNOSIS — E860 Dehydration: Secondary | ICD-10-CM | POA: Diagnosis not present

## 2023-06-05 DIAGNOSIS — F332 Major depressive disorder, recurrent severe without psychotic features: Secondary | ICD-10-CM

## 2023-06-05 DIAGNOSIS — K8020 Calculus of gallbladder without cholecystitis without obstruction: Secondary | ICD-10-CM | POA: Diagnosis not present

## 2023-06-05 DIAGNOSIS — N390 Urinary tract infection, site not specified: Secondary | ICD-10-CM | POA: Diagnosis not present

## 2023-06-05 DIAGNOSIS — F419 Anxiety disorder, unspecified: Secondary | ICD-10-CM

## 2023-06-05 DIAGNOSIS — S0990XA Unspecified injury of head, initial encounter: Secondary | ICD-10-CM | POA: Diagnosis not present

## 2023-06-05 DIAGNOSIS — N281 Cyst of kidney, acquired: Secondary | ICD-10-CM | POA: Diagnosis not present

## 2023-06-05 DIAGNOSIS — Z833 Family history of diabetes mellitus: Secondary | ICD-10-CM | POA: Diagnosis not present

## 2023-06-05 DIAGNOSIS — E1169 Type 2 diabetes mellitus with other specified complication: Secondary | ICD-10-CM | POA: Diagnosis not present

## 2023-06-05 DIAGNOSIS — I6523 Occlusion and stenosis of bilateral carotid arteries: Secondary | ICD-10-CM | POA: Diagnosis not present

## 2023-06-05 DIAGNOSIS — E785 Hyperlipidemia, unspecified: Secondary | ICD-10-CM | POA: Diagnosis not present

## 2023-06-05 DIAGNOSIS — D631 Anemia in chronic kidney disease: Secondary | ICD-10-CM | POA: Diagnosis not present

## 2023-06-05 DIAGNOSIS — I2510 Atherosclerotic heart disease of native coronary artery without angina pectoris: Secondary | ICD-10-CM | POA: Diagnosis not present

## 2023-06-05 DIAGNOSIS — S199XXA Unspecified injury of neck, initial encounter: Secondary | ICD-10-CM | POA: Diagnosis not present

## 2023-06-05 DIAGNOSIS — N3000 Acute cystitis without hematuria: Secondary | ICD-10-CM | POA: Diagnosis not present

## 2023-06-05 DIAGNOSIS — G894 Chronic pain syndrome: Secondary | ICD-10-CM | POA: Diagnosis not present

## 2023-06-05 DIAGNOSIS — F0390 Unspecified dementia without behavioral disturbance: Secondary | ICD-10-CM | POA: Diagnosis not present

## 2023-06-05 DIAGNOSIS — K449 Diaphragmatic hernia without obstruction or gangrene: Secondary | ICD-10-CM | POA: Diagnosis not present

## 2023-06-05 DIAGNOSIS — M47812 Spondylosis without myelopathy or radiculopathy, cervical region: Secondary | ICD-10-CM | POA: Diagnosis not present

## 2023-06-05 DIAGNOSIS — I130 Hypertensive heart and chronic kidney disease with heart failure and stage 1 through stage 4 chronic kidney disease, or unspecified chronic kidney disease: Secondary | ICD-10-CM | POA: Diagnosis not present

## 2023-06-05 DIAGNOSIS — W01198A Fall on same level from slipping, tripping and stumbling with subsequent striking against other object, initial encounter: Secondary | ICD-10-CM | POA: Diagnosis present

## 2023-06-05 DIAGNOSIS — Z794 Long term (current) use of insulin: Secondary | ICD-10-CM | POA: Diagnosis not present

## 2023-06-05 DIAGNOSIS — J4489 Other specified chronic obstructive pulmonary disease: Secondary | ICD-10-CM | POA: Diagnosis not present

## 2023-06-05 DIAGNOSIS — N179 Acute kidney failure, unspecified: Secondary | ICD-10-CM | POA: Diagnosis not present

## 2023-06-05 DIAGNOSIS — I10 Essential (primary) hypertension: Secondary | ICD-10-CM | POA: Diagnosis not present

## 2023-06-05 DIAGNOSIS — R42 Dizziness and giddiness: Secondary | ICD-10-CM

## 2023-06-05 DIAGNOSIS — R519 Headache, unspecified: Secondary | ICD-10-CM

## 2023-06-05 DIAGNOSIS — E875 Hyperkalemia: Secondary | ICD-10-CM | POA: Diagnosis not present

## 2023-06-05 DIAGNOSIS — I5032 Chronic diastolic (congestive) heart failure: Secondary | ICD-10-CM | POA: Diagnosis not present

## 2023-06-05 DIAGNOSIS — Z8249 Family history of ischemic heart disease and other diseases of the circulatory system: Secondary | ICD-10-CM | POA: Diagnosis not present

## 2023-06-05 DIAGNOSIS — N184 Chronic kidney disease, stage 4 (severe): Secondary | ICD-10-CM | POA: Diagnosis not present

## 2023-06-05 DIAGNOSIS — Z7985 Long-term (current) use of injectable non-insulin antidiabetic drugs: Secondary | ICD-10-CM | POA: Diagnosis not present

## 2023-06-05 DIAGNOSIS — D259 Leiomyoma of uterus, unspecified: Secondary | ICD-10-CM | POA: Diagnosis not present

## 2023-06-05 DIAGNOSIS — Z66 Do not resuscitate: Secondary | ICD-10-CM | POA: Diagnosis not present

## 2023-06-05 DIAGNOSIS — M353 Polymyalgia rheumatica: Secondary | ICD-10-CM | POA: Diagnosis not present

## 2023-06-05 DIAGNOSIS — E1165 Type 2 diabetes mellitus with hyperglycemia: Secondary | ICD-10-CM | POA: Diagnosis not present

## 2023-06-05 LAB — CBC
HCT: 33.2 % — ABNORMAL LOW (ref 36.0–46.0)
HCT: 30.4 % — ABNORMAL LOW (ref 36.0–46.0)
Hemoglobin: 9.9 g/dL — ABNORMAL LOW (ref 12.0–15.0)
Hemoglobin: 9.5 g/dL — ABNORMAL LOW (ref 12.0–15.0)
MCH: 29.2 pg (ref 26.0–34.0)
MCH: 29.5 pg (ref 26.0–34.0)
MCHC: 29.8 g/dL — ABNORMAL LOW (ref 30.0–36.0)
MCHC: 31.3 g/dL (ref 30.0–36.0)
MCV: 97.9 fL (ref 80.0–100.0)
MCV: 94.4 fL (ref 80.0–100.0)
Platelets: 238 10*3/uL (ref 150–400)
Platelets: 242 10*3/uL (ref 150–400)
RBC: 3.39 MIL/uL — ABNORMAL LOW (ref 3.87–5.11)
RBC: 3.22 MIL/uL — ABNORMAL LOW (ref 3.87–5.11)
RDW: 13.1 % (ref 11.5–15.5)
RDW: 13.2 % (ref 11.5–15.5)
WBC: 12.4 10*3/uL — ABNORMAL HIGH (ref 4.0–10.5)
WBC: 11.5 10*3/uL — ABNORMAL HIGH (ref 4.0–10.5)
nRBC: 0 % (ref 0.0–0.2)
nRBC: 0 % (ref 0.0–0.2)

## 2023-06-05 LAB — URINALYSIS, W/ REFLEX TO CULTURE (INFECTION SUSPECTED)
Bilirubin Urine: NEGATIVE
Glucose, UA: NEGATIVE mg/dL
Hgb urine dipstick: NEGATIVE
Ketones, ur: NEGATIVE mg/dL
Nitrite: NEGATIVE
Protein, ur: NEGATIVE mg/dL
Specific Gravity, Urine: 1.01 (ref 1.005–1.030)
WBC, UA: >50 WBC/hpf (ref 0–5)
pH: 5 (ref 5.0–8.0)

## 2023-06-05 LAB — COMPREHENSIVE METABOLIC PANEL
ALT: 12 U/L (ref 0–44)
AST: 16 U/L (ref 15–41)
Albumin: 3.1 g/dL — ABNORMAL LOW (ref 3.5–5.0)
Alkaline Phosphatase: 94 U/L (ref 38–126)
Anion gap: 12 (ref 5–15)
BUN: 71 mg/dL — ABNORMAL HIGH (ref 8–23)
CO2: 20 mmol/L — ABNORMAL LOW (ref 22–32)
Calcium: 9.5 mg/dL (ref 8.9–10.3)
Chloride: 99 mmol/L (ref 98–111)
Creatinine, Ser: 2.29 mg/dL — ABNORMAL HIGH (ref 0.44–1.00)
GFR, Estimated: 20 mL/min — ABNORMAL LOW (ref >60)
Glucose, Bld: 270 mg/dL — ABNORMAL HIGH (ref 70–99)
Potassium: 6.8 mmol/L (ref 3.5–5.1)
Sodium: 131 mmol/L — ABNORMAL LOW (ref 135–145)
Total Bilirubin: 0.5 mg/dL (ref 0.0–1.2)
Total Protein: 7.9 g/dL (ref 6.5–8.1)

## 2023-06-05 LAB — POTASSIUM
Potassium: 5.7 mmol/L — ABNORMAL HIGH (ref 3.5–5.1)
Potassium: 5.8 mmol/L — ABNORMAL HIGH (ref 3.5–5.1)
Potassium: 6.3 mmol/L (ref 3.5–5.1)
Potassium: 6.8 mmol/L (ref 3.5–5.1)

## 2023-06-05 LAB — CBG MONITORING, ED
Glucose-Capillary: 230 mg/dL — ABNORMAL HIGH (ref 70–99)
Glucose-Capillary: 231 mg/dL — ABNORMAL HIGH (ref 70–99)
Glucose-Capillary: 253 mg/dL — ABNORMAL HIGH (ref 70–99)
Glucose-Capillary: 253 mg/dL — ABNORMAL HIGH (ref 70–99)

## 2023-06-05 LAB — GLUCOSE, CAPILLARY: Glucose-Capillary: 216 mg/dL — ABNORMAL HIGH (ref 70–99)

## 2023-06-05 LAB — CREATININE, SERUM
Creatinine, Ser: 2.37 mg/dL — ABNORMAL HIGH (ref 0.44–1.00)
GFR, Estimated: 19 mL/min — ABNORMAL LOW (ref >60)

## 2023-06-05 LAB — PHOSPHORUS: Phosphorus: 3.7 mg/dL (ref 2.5–4.6)

## 2023-06-05 LAB — MAGNESIUM: Magnesium: 1.4 mg/dL — ABNORMAL LOW (ref 1.7–2.4)

## 2023-06-05 MED ORDER — PANTOPRAZOLE SODIUM 20 MG PO TBEC
20.0000 mg | DELAYED_RELEASE_TABLET | Freq: Every day | ORAL | Status: DC
  Administered 2023-06-05 – 2023-06-08 (×4): 20 mg via ORAL
  Filled 2023-06-05 (×4): qty 1

## 2023-06-05 MED ORDER — DEXAMETHASONE SODIUM PHOSPHATE 10 MG/ML IJ SOLN: 10.0000 mg | Freq: Once | INTRAMUSCULAR | Status: DC

## 2023-06-05 MED ORDER — ALBUTEROL SULFATE (2.5 MG/3ML) 0.083% IN NEBU: 10.0000 mg/h | INHALATION_SOLUTION | RESPIRATORY_TRACT | Status: AC

## 2023-06-05 MED ORDER — INSULIN ASPART 100 UNIT/ML IJ SOLN: 5.0000 [IU] | Freq: Once | INTRAMUSCULAR | Status: DC

## 2023-06-05 MED ORDER — ACETAMINOPHEN 325 MG PO TABS
650.0000 mg | ORAL_TABLET | Freq: Four times a day (QID) | ORAL | Status: DC | PRN
  Administered 2023-06-05 (×2): 650 mg via ORAL
  Filled 2023-06-05 (×2): qty 2

## 2023-06-05 MED ORDER — APIXABAN 5 MG PO TABS
5.0000 mg | ORAL_TABLET | Freq: Two times a day (BID) | ORAL | Status: DC
Start: 2023-06-05 — End: 2023-06-08
  Administered 2023-06-05 – 2023-06-08 (×7): 5 mg via ORAL
  Filled 2023-06-05 (×7): qty 1

## 2023-06-05 MED ORDER — DEXTROSE 50 % IV SOLN
12.5000 g | INTRAVENOUS | Status: AC
  Administered 2023-06-05: 12.5 g via INTRAVENOUS
  Filled 2023-06-05: qty 50

## 2023-06-05 MED ORDER — MEMANTINE HCL 10 MG PO TABS
5.0000 mg | ORAL_TABLET | Freq: Every day | ORAL | Status: DC
  Administered 2023-06-05 – 2023-06-07 (×3): 5 mg via ORAL
  Filled 2023-06-05 (×3): qty 1

## 2023-06-05 MED ORDER — INSULIN ASPART 100 UNIT/ML IJ SOLN
0.0000 [IU] | Freq: Every day | INTRAMUSCULAR | Status: DC
  Administered 2023-06-05: 2 [IU] via SUBCUTANEOUS

## 2023-06-05 MED ORDER — SODIUM CHLORIDE 0.9 % IV SOLN: INTRAVENOUS | Status: AC

## 2023-06-05 MED ORDER — MONTELUKAST SODIUM 10 MG PO TABS
10.0000 mg | ORAL_TABLET | Freq: Every day | ORAL | Status: DC
  Administered 2023-06-05 – 2023-06-07 (×3): 10 mg via ORAL
  Filled 2023-06-05 (×3): qty 1

## 2023-06-05 MED ORDER — PROCHLORPERAZINE EDISYLATE 10 MG/2ML IJ SOLN: 5.0000 mg | Freq: Four times a day (QID) | INTRAMUSCULAR | Status: DC | PRN

## 2023-06-05 MED ORDER — MEMANTINE HCL 10 MG PO TABS: 5.0000 mg | ORAL_TABLET | ORAL | Status: DC

## 2023-06-05 MED ORDER — INSULIN ASPART 100 UNIT/ML IJ SOLN
0.0000 [IU] | Freq: Three times a day (TID) | INTRAMUSCULAR | Status: DC
  Administered 2023-06-05: 11 [IU] via SUBCUTANEOUS
  Administered 2023-06-05 – 2023-06-06 (×2): 7 [IU] via SUBCUTANEOUS
  Administered 2023-06-07: 3 [IU] via SUBCUTANEOUS
  Administered 2023-06-07: 4 [IU] via SUBCUTANEOUS
  Administered 2023-06-07: 7 [IU] via SUBCUTANEOUS
  Administered 2023-06-08: 4 [IU] via SUBCUTANEOUS

## 2023-06-05 MED ORDER — MELATONIN 5 MG PO TABS
5.0000 mg | ORAL_TABLET | Freq: Every evening | ORAL | Status: DC | PRN
  Administered 2023-06-05 – 2023-06-07 (×3): 5 mg via ORAL
  Filled 2023-06-05 (×3): qty 1

## 2023-06-05 MED ORDER — SODIUM CHLORIDE 0.9 % IV SOLN
1.0000 g | INTRAVENOUS | Status: DC
  Administered 2023-06-06: 1 g via INTRAVENOUS
  Filled 2023-06-05: qty 10

## 2023-06-05 MED ORDER — FUROSEMIDE 10 MG/ML IJ SOLN
40.0000 mg | Freq: Once | INTRAMUSCULAR | Status: AC
  Administered 2023-06-05: 40 mg via INTRAVENOUS
  Filled 2023-06-05: qty 4

## 2023-06-05 MED ORDER — INSULIN ASPART 100 UNIT/ML IV SOLN: 10.0000 [IU] | Freq: Once | INTRAVENOUS | Status: DC

## 2023-06-05 MED ORDER — ALBUTEROL SULFATE (2.5 MG/3ML) 0.083% IN NEBU
10.0000 mg/h | INHALATION_SOLUTION | RESPIRATORY_TRACT | Status: AC
  Administered 2023-06-05: 10 mg/h via RESPIRATORY_TRACT
  Filled 2023-06-05: qty 12

## 2023-06-05 MED ORDER — FLUTICASONE FUROATE-VILANTEROL 100-25 MCG/ACT IN AEPB
1.0000 | INHALATION_SPRAY | Freq: Every day | RESPIRATORY_TRACT | Status: DC
  Administered 2023-06-05 – 2023-06-08 (×4): 1 via RESPIRATORY_TRACT
  Filled 2023-06-05 (×2): qty 28

## 2023-06-05 MED ORDER — MAGNESIUM OXIDE -MG SUPPLEMENT 400 (240 MG) MG PO TABS
800.0000 mg | ORAL_TABLET | Freq: Once | ORAL | Status: AC
  Administered 2023-06-05: 800 mg via ORAL
  Filled 2023-06-05: qty 2

## 2023-06-05 MED ORDER — GABAPENTIN 100 MG PO CAPS
100.0000 mg | ORAL_CAPSULE | Freq: Once | ORAL | Status: AC
  Administered 2023-06-05: 100 mg via ORAL
  Filled 2023-06-05: qty 1

## 2023-06-05 MED ORDER — POLYETHYLENE GLYCOL 3350 17 G PO PACK
17.0000 g | PACK | Freq: Every day | ORAL | Status: DC | PRN
  Administered 2023-06-06: 17 g via ORAL
  Filled 2023-06-05: qty 1

## 2023-06-05 MED ORDER — DILTIAZEM HCL-DEXTROSE 125-5 MG/125ML-% IV SOLN (PREMIX)
INTRAVENOUS | Status: AC
  Filled 2023-06-05: qty 125

## 2023-06-05 MED ORDER — INSULIN ASPART 100 UNIT/ML IJ SOLN
5.0000 [IU] | Freq: Once | INTRAMUSCULAR | Status: AC
Start: 2023-06-05 — End: 2023-06-05
  Administered 2023-06-05: 5 [IU] via INTRAVENOUS

## 2023-06-05 MED ORDER — ATORVASTATIN CALCIUM 80 MG PO TABS
80.0000 mg | ORAL_TABLET | Freq: Every day | ORAL | Status: DC
  Administered 2023-06-05 – 2023-06-07 (×3): 80 mg via ORAL
  Filled 2023-06-05 (×3): qty 1

## 2023-06-05 MED ORDER — INSULIN GLARGINE-YFGN 100 UNIT/ML ~~LOC~~ SOLN
10.0000 [IU] | Freq: Every day | SUBCUTANEOUS | Status: DC
  Administered 2023-06-05 – 2023-06-08 (×4): 10 [IU] via SUBCUTANEOUS
  Filled 2023-06-05 (×4): qty 0.1

## 2023-06-05 MED ORDER — CARVEDILOL 12.5 MG PO TABS
12.5000 mg | ORAL_TABLET | Freq: Two times a day (BID) | ORAL | Status: DC
  Administered 2023-06-05 – 2023-06-08 (×6): 12.5 mg via ORAL
  Filled 2023-06-05 (×7): qty 1

## 2023-06-05 MED ORDER — MEMANTINE HCL 10 MG PO TABS
10.0000 mg | ORAL_TABLET | Freq: Every day | ORAL | Status: DC
  Administered 2023-06-06 – 2023-06-08 (×3): 10 mg via ORAL
  Filled 2023-06-05 (×3): qty 1

## 2023-06-05 MED ORDER — UMECLIDINIUM BROMIDE 62.5 MCG/ACT IN AEPB
1.0000 | INHALATION_SPRAY | Freq: Every day | RESPIRATORY_TRACT | Status: DC
  Administered 2023-06-05 – 2023-06-08 (×4): 1 via RESPIRATORY_TRACT
  Filled 2023-06-05 (×2): qty 7

## 2023-06-05 MED ORDER — AMITRIPTYLINE HCL 50 MG PO TABS
75.0000 mg | ORAL_TABLET | Freq: Every day | ORAL | Status: DC
  Administered 2023-06-05 – 2023-06-07 (×3): 75 mg via ORAL
  Filled 2023-06-05 (×3): qty 2

## 2023-06-05 MED ORDER — DEXTROSE 50 % IV SOLN: 12.5000 g | INTRAVENOUS | Status: AC

## 2023-06-05 NOTE — Progress Notes (Signed)
[facility] night cross cover note:   I was notified by RN that this patient, with history of type 2 diabetes mellitus, is complaining of numbness and paresthesias in symmetrical distribution associated with the bilateral feet.  It does not appear that she is on gabapentin or Lyrica as an outpatient, although it does appear that she is on amitriptyline, which has been reordered.  I subsequently placed a one-time order for gabapentin 100 mg p.o. x 1 dose now.      Newton Pigg, DO Hospitalist

## 2023-06-05 NOTE — Progress Notes (Signed)
Patient heard yelling out " someone help me." Staff came to the room to find patient in a panic with covers and gown thrown off. Patient stated that her feet were burning and numb. Patient's daughter contacted for support. MD made aware, see new orders.

## 2023-06-05 NOTE — Inpatient Diabetes Management (Signed)
Inpatient Diabetes Program Recommendations  AACE/ADA: New Consensus Statement on Inpatient Glycemic Control (2015)  Target Ranges:  Prepandial:   less than 140 mg/dL      Peak postprandial:   less than 180 mg/dL (1-2 hours)      Critically ill patients:  140 - 180 mg/dL   Lab Results  Component Value Date   GLUCAP 253 (H) 06/05/2023   HGBA1C 7.2 (H) 01/03/2023    Review of Glycemic Control  Diabetes history: DM 2 Outpatient Diabetes medications: Toujeo 34 units Daily, Novolog 14 units bid before meals, Ozempic 0.5 mg weekly Current orders for Inpatient glycemic control:  Novolog 0-20 units tid + hs  Decadron 10 mg given once yesterday on 1/27 Renal function elevated Creat 2.29, BUN 71  Glucose trends 20-253  Inpatient Diabetes Program Recommendations:    -   Add Semglee 10 units  Thanks,  Christena Deem RN, MSN, BC-ADM Inpatient Diabetes Coordinator Team Pager [phone] (8a-5p)

## 2023-06-05 NOTE — Progress Notes (Signed)
PROGRESS NOTE    MARDELLE PANDOLFI  WUJ:[id number] DOB: November 27, 1934 DOA: 06/04/2023 PCP: [name], MD     Brief Narrative:  [name] is a 89 y.o. female with medical history significant for obesity, type 2 diabetes, hypertension, history of vertigo, CKD 4, anemia of chronic disease, DVT on Eliquis, chronic HFpEF, who presents to the ER with complaints of headache that began after a fall 6 days ago.  She tripped and fell to the ground striking the right side of her head on the closet door.  Did not lose consciousness.  Associated with nausea without vomiting.  She presented to the ER for further evaluation.   In the emergency department, patient was found to have urinary tract infection, acute kidney injury as well as hyperkalemia.  New events last 24 hours / Subjective: Patient seen and examined in the emergency department.  States that she had a fall, was getting out of bed and her leg gave out under her.  Hit her head on the left side.  Has had some headaches since.  States that previous to falling, she had not been feeling well for quite a while.  She also admits to dysuria.  She states that she has been drinking a lot of orange juice to keep up her blood sugars, and has noticed dysuria since starting orange juice.  Has poor appetite.  Assessment & Plan:   Principal Problem:   Hyperkalemia Active Problems:   Essential hypertension   Chronic diastolic CHF (congestive heart failure) (HCC)   COPD with chronic bronchitis (HCC)   Type 2 diabetes mellitus with hyperlipidemia (HCC)   Acute kidney injury superimposed on chronic kidney disease (HCC)   Dementia (HCC)   Hyperlipidemia   History of DVT (deep vein thrombosis)   Long term current use of anticoagulant therapy   GERD with esophagitis   CKD (chronic kidney disease), stage IV (HCC)   Type 2 diabetes mellitus with stage 4 chronic kidney disease, with long-term current use of insulin (HCC)   Hyperkalemia -Treated with  IV Lasix, Lokelma, calcium gluconate, insulin/D50 -Give additional dose of Lasix and Lokelma this morning -EKG reviewed independently which does show prolonged PR interval -Repeat potassium until normalized  AKI on CKD stage IV -Baseline creatinine around 2.1 -Giving IV fluids along with Lasix (given for hyperK as above) -States that she follows with [facility] outpatient -Trend BMP  UTI, POA -No sepsis on presentation -Has symptoms of dysuria as well as bacteriuria on UA -Urine culture is pending -Empiric Rocephin  Diabetes mellitus type 2 with hyperglycemia -Semglee, sliding scale insulin  Fall -CT head and cervical spine unremarkable -PT OT  Hyperlipidemia -Lipitor  History of DVT -Eliquis  Chronic diastolic CHF -Without acute exacerbation, appears dry on examination -Coreg   DVT prophylaxis:   apixaban (ELIQUIS) tablet 5 mg  Code Status: DNR Family Communication: None at bedside Disposition Plan: Pending PT OT evaluation Status is: Inpatient Remains inpatient appropriate because: IV Lasix, IV fluid, IV antibiotics    Antimicrobials:  Anti-infectives (From admission, onward)    Start     Dose/Rate Route Frequency Ordered Stop   06/05/23 1515  cefTRIAXone (ROCEPHIN) 1 g in sodium chloride 0.9 % 100 mL IVPB        1 g 200 mL/hr over 30 Minutes Intravenous Every 24 hours 06/05/23 1500     06/04/23 1915  ceFAZolin (ANCEF) IVPB 1 g/50 mL premix  Status:  Discontinued  1 g 100 mL/hr over 30 Minutes Intravenous  Once 06/04/23 1912 06/04/23 1954        Objective: Vitals:   06/05/23 1130 06/05/23 1200 06/05/23 1230 06/05/23 1457  BP: 121/72 (!) 127/48 (!) 109/93   Pulse: 88 89 80   Resp: 15 20 15    Temp:    97.6 F (36.4 C)  TempSrc:      SpO2: 100% 100% 100%     Intake/Output Summary (Last 24 hours) at 06/05/2023 1507 Last data filed at 06/05/2023 1033 Gross per 24 hour  Intake 952.94 ml  Output 2000 ml  Net -1047.06 ml    There were no vitals filed for this visit.  Examination:  General exam: Appears calm and comfortable  Respiratory system: Clear to auscultation. Respiratory effort normal. No respiratory distress. No conversational dyspnea.  Cardiovascular system: S1 & S2 heard, RRR. No murmurs. No pedal edema. Gastrointestinal system: Abdomen is nondistended, soft and nontender. Normal bowel sounds heard. Central nervous system: Alert and oriented. No focal neurological deficits. Speech clear.  Extremities: Symmetric in appearance  Skin: No rashes, lesions or ulcers on exposed skin  Psychiatry: Judgement and insight appear normal. Mood & affect appropriate.   Data Reviewed: I have personally reviewed following labs and imaging studies  CBC: Recent Labs  Lab 06/04/23 1315 06/04/23 2134 06/04/23 2226 06/04/23 2238 06/05/23 0237  WBC 8.7  --  12.4*  --  11.5*  NEUTROABS 5.1  --   --   --   --   HGB 9.0* 9.5* 9.9* 11.2* 9.5*  HCT 29.7* 28.0* 33.2* 33.0* 30.4*  MCV 95.8  --  97.9  --  94.4  PLT 248  --  238  --  242   Basic Metabolic Panel: Recent Labs  Lab 06/04/23 1315 06/04/23 2134 06/04/23 2226 06/04/23 2238 06/05/23 0237 06/05/23 0821 06/05/23 1240  NA 136 136  --  137 131*  --   --   K 5.6* 6.6*  --  7.2* 6.8* 5.7* 5.8*  CL 107 105  --  109 99  --   --   CO2 20*  --   --   --  20*  --   --   GLUCOSE 210* 259*  --  257* 270*  --   --   BUN 75* 79*  --  71* 71*  --   --   CREATININE 2.49* 2.60* 2.37* 2.60* 2.29*  --   --   CALCIUM 8.9  --   --   --  9.5  --   --   MG  --   --   --   --  1.4*  --   --   PHOS  --   --   --   --  3.7  --   --    GFR: CrCl cannot be calculated (Unknown ideal weight.). Liver Function Tests: Recent Labs  Lab 06/05/23 0237  AST 16  ALT 12  ALKPHOS 94  BILITOT 0.5  PROT 7.9  ALBUMIN 3.1*   No results for input(s): "LIPASE", "AMYLASE" in the last 168 hours. No results for input(s): "AMMONIA" in the last 168 hours. Coagulation Profile: No  results for input(s): "INR", "PROTIME" in the last 168 hours. Cardiac Enzymes: No results for input(s): "CKTOTAL", "CKMB", "CKMBINDEX", "TROPONINI" in the last 168 hours. BNP (last 3 results) No results for input(s): "PROBNP" in the last 8760 hours. HbA1C: No results for input(s): "HGBA1C" in the last 72 hours. CBG: Recent Labs  Lab 06/04/23 1603 06/05/23 0058 06/05/23 0403 06/05/23 0747 06/05/23 1145  GLUCAP 206* 231* 253* 253* 230*   Lipid Profile: No results for input(s): "CHOL", "HDL", "LDLCALC", "TRIG", "CHOLHDL", "LDLDIRECT" in the last 72 hours. Thyroid Function Tests: No results for input(s): "TSH", "T4TOTAL", "FREET4", "T3FREE", "THYROIDAB" in the last 72 hours. Anemia Panel: No results for input(s): "VITAMINB12", "FOLATE", "FERRITIN", "TIBC", "IRON", "RETICCTPCT" in the last 72 hours. Sepsis Labs: No results for input(s): "PROCALCITON", "LATICACIDVEN" in the last 168 hours.  Recent Results (from the past 240 hours)  Resp panel by RT-PCR (RSV, Flu A&B, Covid) Anterior Nasal Swab     Status: None   Collection Time: 06/04/23  6:48 PM   Specimen: Anterior Nasal Swab  Result Value Ref Range Status   SARS Coronavirus 2 by RT PCR NEGATIVE NEGATIVE Final   Influenza A by PCR NEGATIVE NEGATIVE Final   Influenza B by PCR NEGATIVE NEGATIVE Final    Comment: (NOTE) The Xpert Xpress SARS-CoV-2/FLU/RSV plus assay is intended as an aid in the diagnosis of influenza from Nasopharyngeal swab specimens and should not be used as a sole basis for treatment. Nasal washings and aspirates are unacceptable for Xpert Xpress SARS-CoV-2/FLU/RSV testing.  Fact Sheet for Patients: BloggerCourse.com  Fact Sheet for Healthcare Providers: SeriousBroker.it  This test is not yet approved or cleared by the [place] FDA and has been authorized for detection and/or diagnosis of SARS-CoV-2 by FDA under an Emergency Use Authorization (EUA).  This EUA will remain in effect (meaning this test can be used) for the duration of the COVID-19 declaration under Section 564(b)(1) of the Act, 21 U.S.C. section 360bbb-3(b)(1), unless the authorization is terminated or revoked.     Resp Syncytial Virus by PCR NEGATIVE NEGATIVE Final    Comment: (NOTE) Fact Sheet for Patients: BloggerCourse.com  Fact Sheet for Healthcare Providers: SeriousBroker.it  This test is not yet approved or cleared by the [place] FDA and has been authorized for detection and/or diagnosis of SARS-CoV-2 by FDA under an Emergency Use Authorization (EUA). This EUA will remain in effect (meaning this test can be used) for the duration of the COVID-19 declaration under Section 564(b)(1) of the Act, 21 U.S.C. section 360bbb-3(b)(1), unless the authorization is terminated or revoked.  Performed at [facility], [address], [place], [place] [address]       Radiology Studies: CT ABDOMEN PELVIS WO CONTRAST Result Date: 06/04/2023 CLINICAL DATA:  Acute abdominal pain EXAM: CT ABDOMEN AND PELVIS WITHOUT CONTRAST TECHNIQUE: Multidetector CT imaging of the abdomen and pelvis was performed following the standard protocol without IV contrast. RADIATION DOSE REDUCTION: This exam was performed according to the departmental dose-optimization program which includes automated exposure control, adjustment of the mA and/or kV according to patient size and/or use of iterative reconstruction technique. COMPARISON:  CT abdomen and pelvis 12/26/2022 FINDINGS: Lower chest: No acute abnormality. Hepatobiliary: Gallstones are present. There is no biliary ductal dilatation. The liver is within normal limits. Pancreas: Unremarkable. No pancreatic ductal dilatation or surrounding inflammatory changes. Spleen: Normal in size without focal abnormality. Adrenals/Urinary Tract: Bilateral renal cysts are present measuring up to 3.5 cm  in the right kidney and 2.3 cm in the left kidney. There is no hydronephrosis or urinary tract calculus. The adrenal glands and bladder are within normal limits. Stomach/Bowel: No evidence of bowel wall thickening, distention, or inflammatory changes. Small bowel anastomosis is noted in the left abdomen. The appendix is not visualized. There surgical changes of the gastroesophageal junction. There  is a small hiatal hernia. Vascular/Lymphatic: Aortic atherosclerosis. No enlarged abdominal or pelvic lymph nodes. Reproductive: The uterus is mildly enlarged and heterogeneous with punctate calcifications likely related to fibroid change. The adnexa are within normal limits. Other: No abdominal wall hernia or abnormality. No abdominopelvic ascites. There sutures along the anterior abdominal wall. Musculoskeletal: There severe degenerative changes of the spine. IMPRESSION: 1. No acute localizing process in the abdomen or pelvis. 2. Cholelithiasis. 3. Small hiatal hernia. 4. Fibroid uterus. 5. Aortic atherosclerosis. Aortic Atherosclerosis (ICD10-I70.0). Electronically Signed   By: Darliss Cheney M.D.   On: 06/04/2023 23:39   CT Cervical Spine Wo Contrast Result Date: 06/04/2023 CLINICAL DATA:  Neck trauma.  Fall 6 days ago with head injury. EXAM: CT CERVICAL SPINE WITHOUT CONTRAST TECHNIQUE: Multidetector CT imaging of the cervical spine was performed without intravenous contrast. Multiplanar CT image reconstructions were also generated. RADIATION DOSE REDUCTION: This exam was performed according to the departmental dose-optimization program which includes automated exposure control, adjustment of the mA and/or kV according to patient size and/or use of iterative reconstruction technique. COMPARISON:  Cervical spine radiographs 02/02/2020. FINDINGS: Alignment: Normal. Skull base and vertebrae: No evidence of acute fracture or traumatic subluxation. Soft tissues and spinal canal: No prevertebral fluid or swelling. No  visible canal hematoma. Disc levels: The cervical disc heights are relatively maintained. There is mild multilevel spondylosis with disc bulging, uncinate spurring and facet hypertrophy. No large disc herniation or high-grade spinal stenosis demonstrated. Upper chest: Clear lung apices. Other: Bilateral carotid atherosclerosis. IMPRESSION: 1. No evidence of acute cervical spine fracture, traumatic subluxation or static signs of instability. 2. Mild multilevel cervical spondylosis. Electronically Signed   By: Carey Bullocks M.D.   On: 06/04/2023 14:49   CT Head Wo Contrast Result Date: 06/04/2023 CLINICAL DATA:  Head trauma, minor (Age >= 65y) EXAM: CT HEAD WITHOUT CONTRAST TECHNIQUE: Contiguous axial images were obtained from the base of the skull through the vertex without intravenous contrast. RADIATION DOSE REDUCTION: This exam was performed according to the departmental dose-optimization program which includes automated exposure control, adjustment of the mA and/or kV according to patient size and/or use of iterative reconstruction technique. COMPARISON:  09/12/2022 FINDINGS: Brain: No evidence of acute infarction, hemorrhage, hydrocephalus, extra-axial collection or mass lesion/mass effect. Patchy low-density changes within the periventricular and subcortical white matter most compatible with chronic microvascular ischemic change. Mild diffuse cerebral volume loss. Vascular: Atherosclerotic calcifications involving the large vessels of the skull base. No unexpected hyperdense vessel. Skull: Normal. Negative for fracture or focal lesion. Sinuses/Orbits: Mucosal thickening of the right frontal sinus and within the anterior right ethmoid air cells. Otherwise clear. Other: Negative for scalp hematoma. IMPRESSION: 1. No acute intracranial findings. 2. Chronic microvascular ischemic change and cerebral volume loss. Electronically Signed   By: Duanne Guess D.O.   On: 06/04/2023 14:48      Scheduled  Meds:  amitriptyline  75 mg Oral QHS   apixaban  5 mg Oral BID   atorvastatin  80 mg Oral QHS   carvedilol  12.5 mg Oral BID   fluticasone furoate-vilanterol  1 puff Inhalation Daily   And   umeclidinium bromide  1 puff Inhalation Daily   insulin aspart  0-20 Units Subcutaneous TID WC   insulin aspart  0-5 Units Subcutaneous QHS   insulin glargine-yfgn  10 Units Subcutaneous Daily   memantine  5-10 mg Oral See admin instructions   montelukast  10 mg Oral QHS   oxymetazoline  1 spray Each Nare  Once   pantoprazole  20 mg Oral Daily   sodium zirconium cyclosilicate  10 g Oral TID   Continuous Infusions:  sodium chloride 50 mL/hr at 06/05/23 1033   cefTRIAXone (ROCEPHIN)  IV       LOS: 0 days   Time spent: 35 minutes   Noralee Stain, DO [facility] 06/05/2023, 3:07 PM   Available via Epic secure chat 7am-7pm After these hours, please refer to coverage provider listed on [email or web address]

## 2023-06-05 NOTE — ED Notes (Signed)
Date and time results received: 06/05/23 1630  (use smartphrase ".now" to insert current time)  Test: K+ Critical Value: 6.3  Name of Provider Notified: Dr. Alvino Chapel  Orders Received? Or Actions Taken?: see chart

## 2023-06-06 DIAGNOSIS — N189 Chronic kidney disease, unspecified: Secondary | ICD-10-CM

## 2023-06-06 DIAGNOSIS — I10 Essential (primary) hypertension: Secondary | ICD-10-CM | POA: Diagnosis not present

## 2023-06-06 DIAGNOSIS — N179 Acute kidney failure, unspecified: Secondary | ICD-10-CM | POA: Diagnosis not present

## 2023-06-06 DIAGNOSIS — N184 Chronic kidney disease, stage 4 (severe): Secondary | ICD-10-CM

## 2023-06-06 DIAGNOSIS — E875 Hyperkalemia: Secondary | ICD-10-CM | POA: Diagnosis not present

## 2023-06-06 LAB — CBC
HCT: 24.5 % — ABNORMAL LOW (ref 36.0–46.0)
Hemoglobin: 7.7 g/dL — ABNORMAL LOW (ref 12.0–15.0)
MCH: 29.3 pg (ref 26.0–34.0)
MCHC: 31.4 g/dL (ref 30.0–36.0)
MCV: 93.2 fL (ref 80.0–100.0)
Platelets: 216 10*3/uL (ref 150–400)
RBC: 2.63 MIL/uL — ABNORMAL LOW (ref 3.87–5.11)
RDW: 13.2 % (ref 11.5–15.5)
WBC: 15.1 10*3/uL — ABNORMAL HIGH (ref 4.0–10.5)
nRBC: 0 % (ref 0.0–0.2)

## 2023-06-06 LAB — BASIC METABOLIC PANEL
Anion gap: 10 (ref 5–15)
BUN: 76 mg/dL — ABNORMAL HIGH (ref 8–23)
CO2: 21 mmol/L — ABNORMAL LOW (ref 22–32)
Calcium: 8.5 mg/dL — ABNORMAL LOW (ref 8.9–10.3)
Chloride: 104 mmol/L (ref 98–111)
Creatinine, Ser: 2.5 mg/dL — ABNORMAL HIGH (ref 0.44–1.00)
GFR, Estimated: 18 mL/min — ABNORMAL LOW (ref >60)
Glucose, Bld: 164 mg/dL — ABNORMAL HIGH (ref 70–99)
Potassium: 5.7 mmol/L — ABNORMAL HIGH (ref 3.5–5.1)
Sodium: 135 mmol/L (ref 135–145)

## 2023-06-06 LAB — GLUCOSE, CAPILLARY
Glucose-Capillary: 121 mg/dL — ABNORMAL HIGH (ref 70–99)
Glucose-Capillary: 120 mg/dL — ABNORMAL HIGH (ref 70–99)
Glucose-Capillary: 204 mg/dL — ABNORMAL HIGH (ref 70–99)
Glucose-Capillary: 200 mg/dL — ABNORMAL HIGH (ref 70–99)

## 2023-06-06 LAB — POTASSIUM: Potassium: 5.7 mmol/L — ABNORMAL HIGH (ref 3.5–5.1)

## 2023-06-06 LAB — MAGNESIUM: Magnesium: 1.5 mg/dL — ABNORMAL LOW (ref 1.7–2.4)

## 2023-06-06 MED ORDER — SODIUM CHLORIDE 0.9 % IV SOLN: INTRAVENOUS | Status: DC

## 2023-06-06 MED ORDER — SODIUM ZIRCONIUM CYCLOSILICATE 10 G PO PACK
10.0000 g | PACK | Freq: Three times a day (TID) | ORAL | Status: AC
  Administered 2023-06-06 (×2): 10 g via ORAL
  Filled 2023-06-06 (×2): qty 1

## 2023-06-06 MED ORDER — ACETAMINOPHEN 325 MG PO TABS
650.0000 mg | ORAL_TABLET | Freq: Four times a day (QID) | ORAL | Status: DC | PRN
  Administered 2023-06-06: 650 mg via ORAL
  Filled 2023-06-06 (×2): qty 2

## 2023-06-06 MED ORDER — BUTALBITAL-APAP-CAFFEINE 50-325-40 MG PO TABS: 1.0000 | ORAL_TABLET | Freq: Four times a day (QID) | ORAL | Status: DC | PRN

## 2023-06-06 NOTE — Evaluation (Signed)
Physical Therapy Evaluation Patient Details Name: [name] MRN: [id number] DOB: [birth year] Today's Date: 06/06/2023  History of Present Illness  Pt is a 89 yr old female who presented on 06/04/23 due to having a headache after they had a fall 6 days prior. CT was negative. She was found to have an UTI, AKI and hyperkalemia.  PMH: chronic HFpEF, DM2, CAD (BMS to RCA 2004, DESx2 to mid and proximal RCA 2012, recent NSTEMI 02/2020 s/p DES to RCA), COPD, CKD stage IIIa, IDDM, HTN, HLD, anemia of chronic disease, prior PE/DVT, left breast cancer, depression/anxiety, chronic pain, dementia  Clinical Impression  Pt admitted with above diagnosis. At baseline, pt resides with family.  States she only step pivots at baseline with use of RW and assist to stand.  Also, only stands from elevated bed and her lift chair.  Today, pt in chair at arrival.  She had stood from elevated bed with OT using mod A but from low chair required max x 2 to stand but then min A x 2 pivot to bed using RW.  Suspect only mildly below baseline.  Will continue PT while hospitalized to progress to baseline and strengthen as able.  Could benefit from HHPT to further improve mobility.  If family not able to provide level of assist required would recommend long term care options.  Pt currently with functional limitations due to the deficits listed below (see PT Problem List). Pt will benefit from acute skilled PT to increase their independence and safety with mobility to allow discharge.           If plan is discharge home, recommend the following: A lot of help with walking and/or transfers;A lot of help with bathing/dressing/bathroom;Assistance with cooking/housework;Help with stairs or ramp for entrance   Can travel by private vehicle        Equipment Recommendations None recommended by PT  Recommendations for Other Services       Functional Status Assessment Patient has had a recent decline in their functional status and  demonstrates the ability to make significant improvements in function in a reasonable and predictable amount of time. (mild decrease)     Precautions / Restrictions Precautions Precautions: Fall Precaution Comments: Pt had a fall prior to admission Restrictions Weight Bearing Restrictions Per Provider Order: No      Mobility  Bed Mobility Overal bed mobility: Needs Assistance Bed Mobility: Sit to Supine, Rolling Rolling: Mod assist     Sit to supine: Mod assist   General bed mobility comments: Assist for bil legs back to bed then mod A x 2 to reposition.    Transfers Overall transfer level: Needs assistance Equipment used: Rolling walker (2 wheels) Transfers: Sit to/from Stand, Bed to chair/wheelchair/BSC Sit to Stand: Max assist, +2 physical assistance   Step pivot transfers: Min assist, +2 physical assistance       General transfer comment: Pt was mod A with OT earlier to stand from elevated bed but during PT to stand from recliner required max x 2 with use of gait belt and bed pad.  Pt then required min A x 2 with R knee blocked but did not buckle to step pivot to bed with RW.  Pt expressed fear of falling.  Of note - has lift chair at home.   Started to try STEDY but pt reports feet too high and she can't stand or get forward enough to stand  Ambulation/Gait  General Gait Details: non-amb baseline  Stairs            Wheelchair Mobility     Tilt Bed    Modified Rankin (Stroke Patients Only)       Balance Overall balance assessment: Needs assistance Sitting-balance support: Feet supported, No upper extremity supported Sitting balance-Leahy Scale: Fair     Standing balance support: Bilateral upper extremity supported Standing balance-Leahy Scale: Poor                               Pertinent Vitals/Pain Pain Assessment Pain Assessment: No/denies pain    Home Living Family/patient expects to be discharged to::  Private residence Living Arrangements: Children (dtr) Available Help at Discharge: Family;Available 24 hours/day (reports "most of the time") Type of Home: House Home Access: Level entry       Home Layout: Two level;Able to live on main level with bedroom/bathroom Home Equipment: Wheelchair - manual;BSC/3in1;Rollator (4 wheels);Shower seat - built in;Hand held shower head Additional Comments: only wc level out of the home    Prior Function Prior Level of Function : Independent/Modified Independent       Physical Assist : Mobility (physical);ADLs (physical) Mobility (physical): Bed mobility;Transfers ADLs (physical): Grooming;Bathing;Dressing;Toileting Mobility Comments: Uses WC mainly, assist for all transfers to and from chair/recliner ADLs Comments: Required at least mod assist for bathing, dressing, and toileting (uses bedside commode); family managed cooking and cleaning     Extremity/Trunk Assessment   Upper Extremity Assessment Upper Extremity Assessment: Defer to OT evaluation    Lower Extremity Assessment Lower Extremity Assessment: LLE deficits/detail;RLE deficits/detail RLE Deficits / Details: ROM WFL; MMT: ankle 3/5, knee ext 3/5, hip flex 3/5 LLE Deficits / Details: ROM WFL; MMT: ankle 4/5, knee ext 4/5, hip flex 4/5    Cervical / Trunk Assessment Cervical / Trunk Assessment: Normal  Communication   Communication Communication: Hearing impairment  Cognition Arousal: Alert Behavior During Therapy: WFL for tasks assessed/performed Overall Cognitive Status: History of cognitive impairments - at baseline                                          General Comments General comments (skin integrity, edema, etc.): daughter present    Exercises     Assessment/Plan    PT Assessment Patient needs continued PT services  PT Problem List Decreased strength;Pain;Decreased range of motion;Decreased activity tolerance;Decreased balance;Decreased  mobility;Decreased knowledge of use of DME       PT Treatment Interventions DME instruction;Therapeutic exercise;Balance training;Functional mobility training;Therapeutic activities;Patient/family education;Modalities;Wheelchair mobility training    PT Goals (Current goals can be found in the Care Plan section)  Acute Rehab PT Goals Patient Stated Goal: return home PT Goal Formulation: With patient/family Time For Goal Achievement: 06/20/23 Potential to Achieve Goals: Fair    Frequency Min 1X/week     Co-evaluation               AM-PAC PT "6 Clicks" Mobility  Outcome Measure Help needed turning from your back to your side while in a flat bed without using bedrails?: A Lot Help needed moving from lying on your back to sitting on the side of a flat bed without using bedrails?: A Lot Help needed moving to and from a bed to a chair (including a wheelchair)?: Total Help needed standing up from a chair using your  arms (e.g., wheelchair or bedside chair)?: Total Help needed to walk in hospital room?: Total Help needed climbing 3-5 steps with a railing? : Total 6 Click Score: 8    End of Session Equipment Utilized During Treatment: Gait belt Activity Tolerance: Patient tolerated treatment well Patient left: in bed;with call bell/phone within reach;with bed alarm set;with family/visitor present Nurse Communication: Mobility status PT Visit Diagnosis: Other abnormalities of gait and mobility (R26.89);Muscle weakness (generalized) (M62.81);History of falling (Z91.81)    Time: 1610-9604 PT Time Calculation (min) (ACUTE ONLY): 32 min   Charges:   PT Evaluation $PT Eval Low Complexity: 1 Low PT Treatments $Therapeutic Activity: 8-22 mins PT General Charges $$ ACUTE PT VISIT: 1 Visit         Anise Salvo, PT Acute Rehab [facility] [phone]   Rayetta Humphrey 06/06/2023, 2:23 PM

## 2023-06-06 NOTE — Evaluation (Signed)
Occupational Therapy Evaluation Patient Details Name: [name] MRN: [id number] DOB: [birth year] Today's Date: 06/06/2023   History of Present Illness Pt is a 89 yr old female who presented due to having a headache after they had a fall 6 days prior. CT was negative. She was found to have an UTI, AKI and hyperkalemia.  PMH: chronic HFpEF, DM2, CAD (BMS to RCA 2004, DESx2 to mid and proximal RCA 2012, recent NSTEMI 02/2020 s/p DES to RCA), COPD, CKD stage IIIa, IDDM, HTN, HLD, anemia of chronic disease, prior PE/DVT, left breast cancer, depression/anxiety, chronic pain, dementia   Clinical Impression   Pt presented in bed and agreeable to session. She also presented with family in the room and reported that at baseline they just squat pivot to [facility] or WC and then pulls up onto walker to be able to get into showers. Per family reported they assist with ADLS at home.  At this time was able to complete step pivot to the L side to chair with mod assist to assist to direct pt with hand held assist. Pt needed max-total assist for peri care at bed level and min assist to set up for Ue while sitting at EOB. At this time recommendation for [facility] with aides if possible to assist family with the transition to home.      If plan is discharge home, recommend the following: A lot of help with walking and/or transfers;A lot of help with bathing/dressing/bathroom;Assistance with cooking/housework;Direct supervision/assist for medications management;Direct supervision/assist for financial management;Assist for transportation    Functional Status Assessment  Patient has had a recent decline in their functional status and demonstrates the ability to make significant improvements in function in a reasonable and predictable amount of time.  Equipment Recommendations   (TBD)    Recommendations for Other Services       Precautions / Restrictions Precautions Precautions: Fall Precaution Comments: Pt had a fall  prior to admission Restrictions Weight Bearing Restrictions Per Provider Order: No      Mobility Bed Mobility Overal bed mobility: Needs Assistance Bed Mobility: Supine to Sit     Supine to sit: Mod assist, Max assist, HOB elevated          Transfers Overall transfer level: Needs assistance Equipment used: 1 person hand held assist Transfers: Sit to/from Stand Sit to Stand: Mod assist                  Balance Overall balance assessment: Needs assistance Sitting-balance support: Feet supported, No upper extremity supported Sitting balance-Leahy Scale: Fair     Standing balance support: Bilateral upper extremity supported Standing balance-Leahy Scale: Poor                             ADL either performed or assessed with clinical judgement   ADL Overall ADL's : Needs assistance/impaired Eating/Feeding: Independent;Sitting   Grooming: Wash/dry hands;Wash/dry face;Set up;Sitting   Upper Body Bathing: Minimal assistance;Sitting;Moderate assistance   Lower Body Bathing: Maximal assistance;Sit to/from stand;Sitting/lateral leans   Upper Body Dressing : Minimal assistance;Moderate assistance;Sitting   Lower Body Dressing: Maximal assistance;Bed level   Toilet Transfer: Moderate assistance;Cueing for sequencing;Stand-pivot;Cueing for safety   Toileting- Clothing Manipulation and Hygiene: Total assistance;Sit to/from stand               Vision         Perception         Praxis  Pertinent Vitals/Pain Pain Assessment Pain Assessment: 0-10 Pain Score: 2  Pain Descriptors / Indicators: Discomfort Pain Intervention(s): Limited activity within patient's tolerance, Monitored during session, Repositioned     Extremity/Trunk Assessment Upper Extremity Assessment Upper Extremity Assessment: Generalized weakness   Lower Extremity Assessment Lower Extremity Assessment: Defer to PT evaluation       Communication  Communication Communication: Hearing impairment   Cognition Arousal: Alert Behavior During Therapy: WFL for tasks assessed/performed Overall Cognitive Status: History of cognitive impairments - at baseline                                       General Comments       Exercises     Shoulder Instructions      Home Living Family/patient expects to be discharged to:: Private residence Living Arrangements: Children Available Help at Discharge: Family Type of Home: House Home Access: Level entry     Home Layout: Two level;Able to live on main level with bedroom/bathroom     Bathroom Shower/Tub: Producer, television/film/video: Standard Bathroom Accessibility: Yes How Accessible: Accessible via walker Home Equipment: Wheelchair - manual;BSC/3in1;Rollator (4 wheels);Shower seat - built in;Hand held shower head   Additional Comments: only wc level out of the home      Prior Functioning/Environment Prior Level of Function : Needs assist       Physical Assist : Mobility (physical);ADLs (physical) Mobility (physical): Bed mobility;Transfers ADLs (physical): Grooming;Bathing;Dressing;Toileting Mobility Comments: Uses WC mainly, assist for all transfers to and from chair/recliner ADLs Comments: Required at least mod assist for bathing, dressing, and toileting (uses bedside commode); family managed cooking and cleaning        OT Problem List: Decreased strength;Decreased activity tolerance;Impaired balance (sitting and/or standing);Decreased safety awareness;Decreased knowledge of use of DME or AE;Cardiopulmonary status limiting activity;Pain      OT Treatment/Interventions: Self-care/ADL training;DME and/or AE instruction;Therapeutic activities;Patient/family education    OT Goals(Current goals can be found in the care plan section) Acute Rehab OT Goals Patient Stated Goal: to go home OT Goal Formulation: With patient Time For Goal Achievement:  06/20/23 Potential to Achieve Goals: Good  OT Frequency: Min 1X/week    Co-evaluation              AM-PAC OT "6 Clicks" Daily Activity     Outcome Measure Help from another person eating meals?: None Help from another person taking care of personal grooming?: A Little Help from another person toileting, which includes using toliet, bedpan, or urinal?: A Lot Help from another person bathing (including washing, rinsing, drying)?: A Lot Help from another person to put on and taking off regular upper body clothing?: A Little Help from another person to put on and taking off regular lower body clothing?: A Lot 6 Click Score: 16   End of Session Equipment Utilized During Treatment: Gait belt Nurse Communication: Mobility status  Activity Tolerance: Patient tolerated treatment well Patient left: in chair;with call bell/phone within reach;with family/visitor present  OT Visit Diagnosis: Unsteadiness on feet (R26.81);Other abnormalities of gait and mobility (R26.89);Repeated falls (R29.6);Muscle weakness (generalized) (M62.81);History of falling (Z91.81);Pain Pain - Right/Left:  (L arm from IV)                Time: 4098-1191 OT Time Calculation (min): 45 min Charges:  OT General Charges $OT Visit: 1 Visit OT Treatments $Self Care/Home Management : 38-52 mins  Presley Raddle  OTR/L  Acute Rehab Services  [phone] office number   Alphia Moh 06/06/2023, 11:49 AM

## 2023-06-06 NOTE — Consult Note (Signed)
[facility] Liaison Consult Note   06/06/2023  DAVENE JOBIN 02/13/1935 [id number]  Covering Charlesetta Shanks, RN [facility] hospital liaison).  Primary Care Provider:  Dr. Fanny Bien [facility].  Patient is currently active with Care Management for chronic disease management services.  Patient has been engaged by a Presenter, broadcasting.  Our community based plan of care has focused on disease management and community resource support.   Patient will receive a post hospital call and will be evaluated for assessments and disease process education.  Liaison visited pt at bedside with family present. Liaison explained VBCI services reintroducing the involved RNCM and purpose of the visit today. Will collaborate with the RNCM with VBCI on pt's disposition.  Plan: Pending disposition  Inpatient Transition Of Care [TOC] team member to make aware that Care Management following. Liaison has also notified the [facility] team member active with this pt via community.   Of note, Care Management services does not replace or interfere with any services that are needed or arranged by inpatient [facility] care management team.   For additional questions or referrals please contact:  Elliot Cousin, RN, [facility] Liaison [place]   [facility], Population Health Office Hours MTWF  8:00 am-6:00 pm Direct Dial: [phone] mobile [phone] [Office toll free line] Office Hours are M-F 8:30 - 5 pm [email or web address]

## 2023-06-06 NOTE — Progress Notes (Signed)
PROGRESS NOTE    [name]  WUJ:[id number] DOB: [birth year] DOA: 06/04/2023 PCP: [name], MD     Brief Narrative:  [name] is a 89 y.o. female with medical history significant for obesity, type 2 diabetes, hypertension, history of vertigo, CKD 4, anemia of chronic disease, DVT on Eliquis, chronic HFpEF, who presents to the ER with complaints of headache that began after a fall 6 days ago.  She tripped and fell to the ground striking the right side of her head on the closet door.  Did not lose consciousness.  Associated with nausea without vomiting.  She presented to the ER for further evaluation. In the emergency department, patient was found to have urinary tract infection, acute kidney injury as well as hyperkalemia.  Subjective: Complains of headache but better than before.  No nausea vomiting.  Assessment & Plan:   Hyperkalemia Etiology for hyperkalemia is not entirely clear.  Home medication list reviewed.  Patient is not on any potassium supplements.  Patient is on torsemide but not on spironolactone. Came in with potassium level of 5.6 which increased to 6.6 and then 6.8.  Patient was given IV furosemide Lokelma calcium gluconate and insulin/D50. Improvement in potassium levels noted though still not back to normal.  Will give additional Lokelma today.  AKI on CKD stage IV -Baseline creatinine around 2.1. BUN also noted to be elevated.  Likely has a degree of hypovolemia.  She was also given furosemide for hyperkalemia.  Will give her hydration through IV for now.  Monitor urine output.  Recheck labs tomorrow.  Avoid nephrotoxic agents. -States that she follows with [facility] outpatient  UTI, POA -No sepsis on presentation -Has symptoms of dysuria as well as bacteriuria on UA Urine culture with gram-negative rods.  Await final identification and sensitivity. Continue with ceftriaxone.  Headache Denies any visual disturbances.  CT head was  unremarkable.  She was offered MRI due to persistent symptoms which she declines at this time.  Denies any headaches this morning.  Could try Fioricet. Does not have any focal deficits.  Check ESR though unlikely to be temporal arteritis.  Normocytic anemia Has a longstanding history of anemia.  Hemoglobin seems to be close to baseline.  No evidence of overt bleeding.  Continue to monitor.  Diabetes mellitus type 2 with hyperglycemia -Semglee, sliding scale insulin  Hyperlipidemia -Lipitor  History of DVT -Eliquis  Chronic diastolic CHF -Without acute exacerbation, appears dry on examination -Coreg   DVT prophylaxis: Noted to be on Eliquis Code Status: DNR Family Communication: None at bedside Disposition Plan: Pending PT OT evaluation    Antimicrobials:  Anti-infectives (From admission, onward)    Start     Dose/Rate Route Frequency Ordered Stop   06/05/23 1600  cefTRIAXone (ROCEPHIN) 1 g in sodium chloride 0.9 % 100 mL IVPB        1 g 200 mL/hr over 30 Minutes Intravenous Every 24 hours 06/05/23 1500     06/04/23 1915  ceFAZolin (ANCEF) IVPB 1 g/50 mL premix  Status:  Discontinued        1 g 100 mL/hr over 30 Minutes Intravenous  Once 06/04/23 1912 06/04/23 1954        Objective: Vitals:   06/06/23 0428 06/06/23 0430 06/06/23 0947 06/06/23 1131  BP: (!) 120/53  (!) 122/31   Pulse: 71  72   Resp: 18     Temp: (!) 97 F (36.1 C)  97.6 F (36.4 C)  TempSrc: Oral     SpO2: 100%  97% 98%  Weight:  117.3 kg    Height:       No intake or output data in the 24 hours ending 06/06/23 1138  Filed Weights   06/05/23 1800 06/06/23 0430  Weight: 120.2 kg 117.3 kg    Examination:   General appearance: Awake alert.  In no distress Resp: Clear to auscultation bilaterally.  Normal effort Cardio: S1-S2 is normal regular.  No S3-S4.  No rubs murmurs or bruit GI: Abdomen is soft.  Nontender nondistended.  Bowel sounds are present normal.  No masses  organomegaly Extremities: No edema.  Physical deconditioning is noted. Neurologic: Alert and oriented x3.  No focal neurological deficits.    Data Reviewed: I have personally reviewed following labs and imaging studies  CBC: Recent Labs  Lab 06/04/23 1315 06/04/23 2134 06/04/23 2226 06/04/23 2238 06/05/23 0237 06/06/23 0611  WBC 8.7  --  12.4*  --  11.5* 15.1*  NEUTROABS 5.1  --   --   --   --   --   HGB 9.0* 9.5* 9.9* 11.2* 9.5* 7.7*  HCT 29.7* 28.0* 33.2* 33.0* 30.4* 24.5*  MCV 95.8  --  97.9  --  94.4 93.2  PLT 248  --  238  --  242 216   Basic Metabolic Panel: Recent Labs  Lab 06/04/23 1315 06/04/23 2134 06/04/23 2226 06/04/23 2238 06/05/23 0237 06/05/23 0821 06/05/23 1240 06/05/23 1540 06/05/23 2031 06/06/23 0611 06/06/23 0612  NA 136 136  --  137 131*  --   --   --   --  135  --   K 5.6* 6.6*  --  7.2* 6.8*   < > 5.8* 6.3* 6.8* 5.7* 5.7*  CL 107 105  --  109 99  --   --   --   --  104  --   CO2 20*  --   --   --  20*  --   --   --   --  21*  --   GLUCOSE 210* 259*  --  257* 270*  --   --   --   --  164*  --   BUN 75* 79*  --  71* 71*  --   --   --   --  76*  --   CREATININE 2.49* 2.60* 2.37* 2.60* 2.29*  --   --   --   --  2.50*  --   CALCIUM 8.9  --   --   --  9.5  --   --   --   --  8.5*  --   MG  --   --   --   --  1.4*  --   --   --   --  1.5*  --   PHOS  --   --   --   --  3.7  --   --   --   --   --   --    < > = values in this interval not displayed.   GFR: Estimated Creatinine Clearance: 21.4 mL/min (A) (by C-G formula based on SCr of 2.5 mg/dL (H)). Liver Function Tests: Recent Labs  Lab 06/05/23 0237  AST 16  ALT 12  ALKPHOS 94  BILITOT 0.5  PROT 7.9  ALBUMIN 3.1*   CBG: Recent Labs  Lab 06/05/23 0403 06/05/23 0747 06/05/23 1145 06/05/23 2046 06/06/23 0949  GLUCAP 253* 253* 230*  216* 121*    Recent Results (from the past 240 hours)  Resp panel by RT-PCR (RSV, Flu A&B, Covid) Anterior Nasal Swab     Status: None   Collection  Time: 06/04/23  6:48 PM   Specimen: Anterior Nasal Swab  Result Value Ref Range Status   SARS Coronavirus 2 by RT PCR NEGATIVE NEGATIVE Final   Influenza A by PCR NEGATIVE NEGATIVE Final   Influenza B by PCR NEGATIVE NEGATIVE Final    Comment: (NOTE) The Xpert Xpress SARS-CoV-2/FLU/RSV plus assay is intended as an aid in the diagnosis of influenza from Nasopharyngeal swab specimens and should not be used as a sole basis for treatment. Nasal washings and aspirates are unacceptable for Xpert Xpress SARS-CoV-2/FLU/RSV testing.  Fact Sheet for Patients: BloggerCourse.com  Fact Sheet for Healthcare Providers: SeriousBroker.it  This test is not yet approved or cleared by the [place] FDA and has been authorized for detection and/or diagnosis of SARS-CoV-2 by FDA under an Emergency Use Authorization (EUA). This EUA will remain in effect (meaning this test can be used) for the duration of the COVID-19 declaration under Section 564(b)(1) of the Act, 21 U.S.C. section 360bbb-3(b)(1), unless the authorization is terminated or revoked.     Resp Syncytial Virus by PCR NEGATIVE NEGATIVE Final    Comment: (NOTE) Fact Sheet for Patients: BloggerCourse.com  Fact Sheet for Healthcare Providers: SeriousBroker.it  This test is not yet approved or cleared by the [place] FDA and has been authorized for detection and/or diagnosis of SARS-CoV-2 by FDA under an Emergency Use Authorization (EUA). This EUA will remain in effect (meaning this test can be used) for the duration of the COVID-19 declaration under Section 564(b)(1) of the Act, 21 U.S.C. section 360bbb-3(b)(1), unless the authorization is terminated or revoked.  Performed at [facility], [address], [place], [place] [address]   Urine Culture     Status: Abnormal (Preliminary result)   Collection Time: 06/05/23 12:37  AM   Specimen: Urine, Random  Result Value Ref Range Status   Specimen Description URINE, RANDOM  Final   Special Requests   Final    NONE Reflexed from [id number] Performed at [facility], [address] [place], [place] [address]    Culture >=100,000 COLONIES/mL GRAM NEGATIVE RODS (A)  Final   Report Status PENDING  Incomplete      Radiology Studies: CT ABDOMEN PELVIS WO CONTRAST Result Date: 06/04/2023 CLINICAL DATA:  Acute abdominal pain EXAM: CT ABDOMEN AND PELVIS WITHOUT CONTRAST TECHNIQUE: Multidetector CT imaging of the abdomen and pelvis was performed following the standard protocol without IV contrast. RADIATION DOSE REDUCTION: This exam was performed according to the departmental dose-optimization program which includes automated exposure control, adjustment of the mA and/or kV according to patient size and/or use of iterative reconstruction technique. COMPARISON:  CT abdomen and pelvis 12/26/2022 FINDINGS: Lower chest: No acute abnormality. Hepatobiliary: Gallstones are present. There is no biliary ductal dilatation. The liver is within normal limits. Pancreas: Unremarkable. No pancreatic ductal dilatation or surrounding inflammatory changes. Spleen: Normal in size without focal abnormality. Adrenals/Urinary Tract: Bilateral renal cysts are present measuring up to 3.5 cm in the right kidney and 2.3 cm in the left kidney. There is no hydronephrosis or urinary tract calculus. The adrenal glands and bladder are within normal limits. Stomach/Bowel: No evidence of bowel wall thickening, distention, or inflammatory changes. Small bowel anastomosis is noted in the left abdomen. The appendix is not visualized. There surgical changes of the  gastroesophageal junction. There is a small hiatal hernia. Vascular/Lymphatic: Aortic atherosclerosis. No enlarged abdominal or pelvic lymph nodes. Reproductive: The uterus is mildly enlarged and heterogeneous with punctate calcifications likely related to  fibroid change. The adnexa are within normal limits. Other: No abdominal wall hernia or abnormality. No abdominopelvic ascites. There sutures along the anterior abdominal wall. Musculoskeletal: There severe degenerative changes of the spine. IMPRESSION: 1. No acute localizing process in the abdomen or pelvis. 2. Cholelithiasis. 3. Small hiatal hernia. 4. Fibroid uterus. 5. Aortic atherosclerosis. Aortic Atherosclerosis (ICD10-I70.0). Electronically Signed   By: Darliss Cheney M.D.   On: 06/04/2023 23:39   CT Cervical Spine Wo Contrast Result Date: 06/04/2023 CLINICAL DATA:  Neck trauma.  Fall 6 days ago with head injury. EXAM: CT CERVICAL SPINE WITHOUT CONTRAST TECHNIQUE: Multidetector CT imaging of the cervical spine was performed without intravenous contrast. Multiplanar CT image reconstructions were also generated. RADIATION DOSE REDUCTION: This exam was performed according to the departmental dose-optimization program which includes automated exposure control, adjustment of the mA and/or kV according to patient size and/or use of iterative reconstruction technique. COMPARISON:  Cervical spine radiographs 02/02/2020. FINDINGS: Alignment: Normal. Skull base and vertebrae: No evidence of acute fracture or traumatic subluxation. Soft tissues and spinal canal: No prevertebral fluid or swelling. No visible canal hematoma. Disc levels: The cervical disc heights are relatively maintained. There is mild multilevel spondylosis with disc bulging, uncinate spurring and facet hypertrophy. No large disc herniation or high-grade spinal stenosis demonstrated. Upper chest: Clear lung apices. Other: Bilateral carotid atherosclerosis. IMPRESSION: 1. No evidence of acute cervical spine fracture, traumatic subluxation or static signs of instability. 2. Mild multilevel cervical spondylosis. Electronically Signed   By: Carey Bullocks M.D.   On: 06/04/2023 14:49   CT Head Wo Contrast Result Date: 06/04/2023 CLINICAL DATA:  Head  trauma, minor (Age >= 65y) EXAM: CT HEAD WITHOUT CONTRAST TECHNIQUE: Contiguous axial images were obtained from the base of the skull through the vertex without intravenous contrast. RADIATION DOSE REDUCTION: This exam was performed according to the departmental dose-optimization program which includes automated exposure control, adjustment of the mA and/or kV according to patient size and/or use of iterative reconstruction technique. COMPARISON:  09/12/2022 FINDINGS: Brain: No evidence of acute infarction, hemorrhage, hydrocephalus, extra-axial collection or mass lesion/mass effect. Patchy low-density changes within the periventricular and subcortical white matter most compatible with chronic microvascular ischemic change. Mild diffuse cerebral volume loss. Vascular: Atherosclerotic calcifications involving the large vessels of the skull base. No unexpected hyperdense vessel. Skull: Normal. Negative for fracture or focal lesion. Sinuses/Orbits: Mucosal thickening of the right frontal sinus and within the anterior right ethmoid air cells. Otherwise clear. Other: Negative for scalp hematoma. IMPRESSION: 1. No acute intracranial findings. 2. Chronic microvascular ischemic change and cerebral volume loss. Electronically Signed   By: Duanne Guess D.O.   On: 06/04/2023 14:48      Scheduled Meds:  amitriptyline  75 mg Oral QHS   apixaban  5 mg Oral BID   atorvastatin  80 mg Oral QHS   carvedilol  12.5 mg Oral BID   dexamethasone (DECADRON) injection  10 mg Intravenous Once   dextrose  12.5 g Intravenous STAT   fluticasone furoate-vilanterol  1 puff Inhalation Daily   And   umeclidinium bromide  1 puff Inhalation Daily   insulin aspart  0-20 Units Subcutaneous TID WC   insulin aspart  0-5 Units Subcutaneous QHS   insulin aspart  5 Units Intravenous Once   insulin glargine-yfgn  10 Units Subcutaneous Daily   memantine  5 mg Oral QHS   And   memantine  10 mg Oral Daily   montelukast  10 mg Oral QHS    oxymetazoline  1 spray Each Nare Once   pantoprazole  20 mg Oral Daily   Continuous Infusions:  cefTRIAXone (ROCEPHIN)  IV       LOS: 1 day    Osvaldo Shipper,  [facility] 06/06/2023, 11:38 AM   Available via Epic secure chat 7am-7pm After these hours, please refer to coverage provider listed on [email or web address]

## 2023-06-06 NOTE — Progress Notes (Signed)
OT Cancellation Note  Patient Details Name: SWAY GUTTIERREZ MRN: [id number] DOB: December 20, 1934   Cancelled Treatment:    Reason Eval/Treat Not Completed: Other (comment) (Pt reporting they are not ready to get up and feeling to tired. Will attempt re attempt.)  Presley Raddle OTR/L  Acute Rehab Services  [phone] office number   Alphia Moh 06/06/2023, 8:00 AM

## 2023-06-06 NOTE — Plan of Care (Signed)
  Problem: Nutritional: Goal: Maintenance of adequate nutrition will improve Outcome: Progressing   Problem: Skin Integrity: Goal: Risk for impaired skin integrity will decrease Outcome: Progressing   Problem: Education: Goal: Knowledge of General Education information will improve Description: Including pain rating scale, medication(s)/side effects and non-pharmacologic comfort measures Outcome: Progressing   Problem: Clinical Measurements: Goal: Ability to maintain clinical measurements within normal limits will improve Outcome: Progressing   Problem: Coping: Goal: Level of anxiety will decrease Outcome: Progressing

## 2023-06-06 NOTE — Progress Notes (Signed)
PT Cancellation Note  Patient Details Name: KENDEL PESNELL MRN: [id number] DOB: 10-Oct-1934   Cancelled Treatment:    Reason Eval/Treat Not Completed: Other (comment) Noted pt recently declined OT due to not ready to get up.  Will f/u later today. Anise Salvo, PT Acute Rehab [facility] [phone]   Rayetta Humphrey 06/06/2023, 10:02 AM

## 2023-06-07 ENCOUNTER — Ambulatory Visit: Payer: PPO | Admitting: Podiatry

## 2023-06-07 DIAGNOSIS — E785 Hyperlipidemia, unspecified: Secondary | ICD-10-CM

## 2023-06-07 DIAGNOSIS — E1169 Type 2 diabetes mellitus with other specified complication: Secondary | ICD-10-CM | POA: Diagnosis not present

## 2023-06-07 DIAGNOSIS — E875 Hyperkalemia: Secondary | ICD-10-CM | POA: Diagnosis not present

## 2023-06-07 DIAGNOSIS — Z7901 Long term (current) use of anticoagulants: Secondary | ICD-10-CM | POA: Diagnosis not present

## 2023-06-07 DIAGNOSIS — N3000 Acute cystitis without hematuria: Secondary | ICD-10-CM | POA: Diagnosis not present

## 2023-06-07 LAB — URINE CULTURE: Culture: >=100000 — AB

## 2023-06-07 LAB — GLUCOSE, CAPILLARY
Glucose-Capillary: 133 mg/dL — ABNORMAL HIGH (ref 70–99)
Glucose-Capillary: 141 mg/dL — ABNORMAL HIGH (ref 70–99)
Glucose-Capillary: 160 mg/dL — ABNORMAL HIGH (ref 70–99)
Glucose-Capillary: 170 mg/dL — ABNORMAL HIGH (ref 70–99)
Glucose-Capillary: 175 mg/dL — ABNORMAL HIGH (ref 70–99)
Glucose-Capillary: 204 mg/dL — ABNORMAL HIGH (ref 70–99)

## 2023-06-07 LAB — CBC
HCT: 25.6 % — ABNORMAL LOW (ref 36.0–46.0)
Hemoglobin: 8.1 g/dL — ABNORMAL LOW (ref 12.0–15.0)
MCH: 29.6 pg (ref 26.0–34.0)
MCHC: 31.6 g/dL (ref 30.0–36.0)
MCV: 93.4 fL (ref 80.0–100.0)
Platelets: 223 10*3/uL (ref 150–400)
RBC: 2.74 MIL/uL — ABNORMAL LOW (ref 3.87–5.11)
RDW: 13.5 % (ref 11.5–15.5)
WBC: 11.5 10*3/uL — ABNORMAL HIGH (ref 4.0–10.5)
nRBC: 0 % (ref 0.0–0.2)

## 2023-06-07 LAB — BASIC METABOLIC PANEL
Anion gap: 10 (ref 5–15)
BUN: 81 mg/dL — ABNORMAL HIGH (ref 8–23)
CO2: 20 mmol/L — ABNORMAL LOW (ref 22–32)
Calcium: 8.1 mg/dL — ABNORMAL LOW (ref 8.9–10.3)
Chloride: 107 mmol/L (ref 98–111)
Creatinine, Ser: 2.45 mg/dL — ABNORMAL HIGH (ref 0.44–1.00)
GFR, Estimated: 19 mL/min — ABNORMAL LOW (ref >60)
Glucose, Bld: 140 mg/dL — ABNORMAL HIGH (ref 70–99)
Potassium: 5.2 mmol/L — ABNORMAL HIGH (ref 3.5–5.1)
Sodium: 137 mmol/L (ref 135–145)

## 2023-06-07 LAB — SEDIMENTATION RATE: Sed Rate: 103 mm/h — ABNORMAL HIGH (ref 0–22)

## 2023-06-07 MED ORDER — CEFADROXIL 500 MG PO CAPS
500.0000 mg | ORAL_CAPSULE | Freq: Two times a day (BID) | ORAL | Status: DC
  Administered 2023-06-07: 500 mg via ORAL
  Filled 2023-06-07: qty 1

## 2023-06-07 MED ORDER — NYSTATIN 100000 UNIT/GM EX POWD
Freq: Two times a day (BID) | CUTANEOUS | Status: DC
  Filled 2023-06-07: qty 15

## 2023-06-07 MED ORDER — CEFADROXIL 500 MG PO CAPS
500.0000 mg | ORAL_CAPSULE | Freq: Every day | ORAL | Status: DC
  Administered 2023-06-08: 500 mg via ORAL
  Filled 2023-06-07: qty 1

## 2023-06-07 MED ORDER — SODIUM ZIRCONIUM CYCLOSILICATE 10 G PO PACK
10.0000 g | PACK | Freq: Three times a day (TID) | ORAL | Status: AC
Start: 2023-06-07 — End: 2023-06-07
  Administered 2023-06-07 (×2): 10 g via ORAL
  Filled 2023-06-07 (×4): qty 1

## 2023-06-07 MED ORDER — SODIUM CHLORIDE 0.9 % IV SOLN: INTRAVENOUS | Status: AC

## 2023-06-07 NOTE — Progress Notes (Signed)
PROGRESS NOTE    [name]  YNW:[id number] DOB: May 05, 1935 DOA: 06/04/2023 PCP: [name], MD     Brief Narrative:  [name] is a 89 y.o. female with medical history significant for obesity, type 2 diabetes, hypertension, history of vertigo, CKD 4, anemia of chronic disease, DVT on Eliquis, chronic HFpEF, who presents to the ER with complaints of headache that began after a fall 6 days ago.  She tripped and fell to the ground striking the right side of her head on the closet door.  Did not lose consciousness.  Associated with nausea without vomiting.  She presented to the ER for further evaluation. In the emergency department, patient was found to have urinary tract infection, acute kidney injury as well as hyperkalemia.  Subjective: Patient mentions that headache has improved.  Denies any nausea vomiting.  Overall she feels better.  No abdominal pain.  Assessment & Plan:   Hyperkalemia Etiology for hyperkalemia is not entirely clear.  There is mention that she was consuming a lot of orange juice.  Home medication list reviewed.  Patient is not on any potassium supplements.  Patient is on torsemide but not on spironolactone. Came in with potassium level of 5.6 which increased to 6.6 and then 6.8.  Patient was given IV furosemide Lokelma calcium gluconate and insulin/D50. Given additional doses of Lokelma yesterday.  Potassium level is better.  Will give Lokelma again today.  Recheck labs tomorrow.  AKI on CKD stage IV Review of old labs suggest that her baseline creatinine is anywhere between 2-2.7.  Also has elevated BUN at baseline. Continue to gently hydrate.  Renal function stable this morning.  Will recheck labs again tomorrow morning.  Avoid nephrotoxic agents. States that she follows with [facility] as outpatient.  UTI, POA -No sepsis on presentation -Has symptoms of dysuria as well as bacteriuria on UA Urine culture positive for Proteus.   Sensitivities reviewed.  Will change to cefadroxil.  Headache Denies any visual disturbances.  CT head was unremarkable.   Due to persistent symptoms she was offered MRI which she declined.  She tells me this morning that her headache has significantly improved.   Continue with Fioricet. ESR noted to be elevated but there are several reasons why this could be elevated in her case including acute infection.  Presentation not consistent with temporal arteritis.  Normocytic anemia Has a longstanding history of anemia.  Hemoglobin seems to be close to baseline.  No evidence of overt bleeding.  Continue to monitor.  Diabetes mellitus type 2 with hyperglycemia -Semglee, sliding scale insulin CBGs are reasonably well-controlled.  Hyperlipidemia -Lipitor  History of DVT -Eliquis  Chronic diastolic CHF -Without acute exacerbation, appears dry on examination -Coreg   DVT prophylaxis: Noted to be on Eliquis Code Status: DNR Family Communication: None at bedside.  Patient mentions that she will update her family members. Disposition Plan: Patient is nonambulatory at baseline.  Plan to return home with home health.    Antimicrobials:  Anti-infectives (From admission, onward)    Start     Dose/Rate Route Frequency Ordered Stop   06/05/23 1600  cefTRIAXone (ROCEPHIN) 1 g in sodium chloride 0.9 % 100 mL IVPB        1 g 200 mL/hr over 30 Minutes Intravenous Every 24 hours 06/05/23 1500     06/04/23 1915  ceFAZolin (ANCEF) IVPB 1 g/50 mL premix  Status:  Discontinued        1 g 100 mL/hr over  30 Minutes Intravenous  Once 06/04/23 1912 06/04/23 1954        Objective: Vitals:   06/07/23 0043 06/07/23 0534 06/07/23 0753 06/07/23 0806  BP: (!) 112/53 (!) 130/49 (!) 125/55   Pulse: 82 78 84   Resp: 18 18 16    Temp: 98 F (36.7 C) 98.2 F (36.8 C)    TempSrc: Oral Oral    SpO2: 100% 98% 100% 96%  Weight:  73.8 kg    Height:        Intake/Output Summary (Last 24 hours) at  06/07/2023 1009 Last data filed at 06/07/2023 0311 Gross per 24 hour  Intake 761.34 ml  Output --  Net 761.34 ml    Filed Weights   06/05/23 1800 06/06/23 0430 06/07/23 0534  Weight: 120.2 kg 117.3 kg 73.8 kg    Examination:   General appearance: Awake alert.  In no distress Resp: Clear to auscultation bilaterally.  Normal effort Cardio: S1-S2 is normal regular.  No S3-S4.  No rubs murmurs or bruit GI: Abdomen is soft.  Nontender nondistended.  Bowel sounds are present normal.  No masses organomegaly    Data Reviewed: I have personally reviewed following labs and imaging studies  CBC: Recent Labs  Lab 06/04/23 1315 06/04/23 2134 06/04/23 2226 06/04/23 2238 06/05/23 0237 06/06/23 0611 06/07/23 0611  WBC 8.7  --  12.4*  --  11.5* 15.1* 11.5*  NEUTROABS 5.1  --   --   --   --   --   --   HGB 9.0*   < > 9.9* 11.2* 9.5* 7.7* 8.1*  HCT 29.7*   < > 33.2* 33.0* 30.4* 24.5* 25.6*  MCV 95.8  --  97.9  --  94.4 93.2 93.4  PLT 248  --  238  --  242 216 223   < > = values in this interval not displayed.   Basic Metabolic Panel: Recent Labs  Lab 06/04/23 1315 06/04/23 2134 06/04/23 2226 06/04/23 2238 06/05/23 0237 06/05/23 0821 06/05/23 1540 06/05/23 2031 06/06/23 0611 06/06/23 0612 06/07/23 0611  NA 136 136  --  137 131*  --   --   --  135  --  137  K 5.6* 6.6*  --  7.2* 6.8*   < > 6.3* 6.8* 5.7* 5.7* 5.2*  CL 107 105  --  109 99  --   --   --  104  --  107  CO2 20*  --   --   --  20*  --   --   --  21*  --  20*  GLUCOSE 210* 259*  --  257* 270*  --   --   --  164*  --  140*  BUN 75* 79*  --  71* 71*  --   --   --  76*  --  81*  CREATININE 2.49* 2.60* 2.37* 2.60* 2.29*  --   --   --  2.50*  --  2.45*  CALCIUM 8.9  --   --   --  9.5  --   --   --  8.5*  --  8.1*  MG  --   --   --   --  1.4*  --   --   --  1.5*  --   --   PHOS  --   --   --   --  3.7  --   --   --   --   --   --    < > =  values in this interval not displayed.   GFR: Estimated Creatinine Clearance:  16 mL/min (A) (by C-G formula based on SCr of 2.45 mg/dL (H)).  Liver Function Tests: Recent Labs  Lab 06/05/23 0237  AST 16  ALT 12  ALKPHOS 94  BILITOT 0.5  PROT 7.9  ALBUMIN 3.1*   CBG: Recent Labs  Lab 06/06/23 1609 06/06/23 2133 06/07/23 0046 06/07/23 0536 06/07/23 0753  GLUCAP 204* 200* 160* 141* 133*    Recent Results (from the past 240 hours)  Resp panel by RT-PCR (RSV, Flu A&B, Covid) Anterior Nasal Swab     Status: None   Collection Time: 06/04/23  6:48 PM   Specimen: Anterior Nasal Swab  Result Value Ref Range Status   SARS Coronavirus 2 by RT PCR NEGATIVE NEGATIVE Final   Influenza A by PCR NEGATIVE NEGATIVE Final   Influenza B by PCR NEGATIVE NEGATIVE Final    Comment: (NOTE) The Xpert Xpress SARS-CoV-2/FLU/RSV plus assay is intended as an aid in the diagnosis of influenza from Nasopharyngeal swab specimens and should not be used as a sole basis for treatment. Nasal washings and aspirates are unacceptable for Xpert Xpress SARS-CoV-2/FLU/RSV testing.  Fact Sheet for Patients: BloggerCourse.com  Fact Sheet for Healthcare Providers: SeriousBroker.it  This test is not yet approved or cleared by the [place] FDA and has been authorized for detection and/or diagnosis of SARS-CoV-2 by FDA under an Emergency Use Authorization (EUA). This EUA will remain in effect (meaning this test can be used) for the duration of the COVID-19 declaration under Section 564(b)(1) of the Act, 21 U.S.C. section 360bbb-3(b)(1), unless the authorization is terminated or revoked.     Resp Syncytial Virus by PCR NEGATIVE NEGATIVE Final    Comment: (NOTE) Fact Sheet for Patients: BloggerCourse.com  Fact Sheet for Healthcare Providers: SeriousBroker.it  This test is not yet approved or cleared by the [place] FDA and has been authorized for detection and/or diagnosis  of SARS-CoV-2 by FDA under an Emergency Use Authorization (EUA). This EUA will remain in effect (meaning this test can be used) for the duration of the COVID-19 declaration under Section 564(b)(1) of the Act, 21 U.S.C. section 360bbb-3(b)(1), unless the authorization is terminated or revoked.  Performed at [facility], [address] [place], [place] [address]   Urine Culture     Status: Abnormal   Collection Time: 06/05/23 12:37 AM   Specimen: Urine, Random  Result Value Ref Range Status   Specimen Description URINE, RANDOM  Final   Special Requests   Final    NONE Reflexed from [id number] Performed at [facility], [address], [place], [place] [address]    Culture >=100,000 COLONIES/mL PROTEUS MIRABILIS (A)  Final   Report Status 06/07/2023 FINAL  Final   Organism ID, Bacteria PROTEUS MIRABILIS (A)  Final      Susceptibility   Proteus mirabilis - MIC*    AMPICILLIN <=2 SENSITIVE Sensitive     CEFAZOLIN <=4 SENSITIVE Sensitive     CEFEPIME <=0.12 SENSITIVE Sensitive     CEFTRIAXONE <=0.25 SENSITIVE Sensitive     CIPROFLOXACIN <=0.25 SENSITIVE Sensitive     GENTAMICIN <=1 SENSITIVE Sensitive     IMIPENEM 2 SENSITIVE Sensitive     NITROFURANTOIN RESISTANT Resistant     TRIMETH/SULFA <=20 SENSITIVE Sensitive     AMPICILLIN/SULBACTAM <=2 SENSITIVE Sensitive     PIP/TAZO <=4 SENSITIVE Sensitive ug/mL    * >=100,000 COLONIES/mL PROTEUS MIRABILIS      Radiology  Studies: No results found.     Scheduled Meds:  amitriptyline  75 mg Oral QHS   apixaban  5 mg Oral BID   atorvastatin  80 mg Oral QHS   carvedilol  12.5 mg Oral BID   fluticasone furoate-vilanterol  1 puff Inhalation Daily   And   umeclidinium bromide  1 puff Inhalation Daily   insulin aspart  0-20 Units Subcutaneous TID WC   insulin aspart  0-5 Units Subcutaneous QHS   insulin aspart  5 Units Intravenous Once   insulin glargine-yfgn  10 Units Subcutaneous Daily   memantine  5 mg Oral QHS    And   memantine  10 mg Oral Daily   montelukast  10 mg Oral QHS   oxymetazoline  1 spray Each Nare Once   pantoprazole  20 mg Oral Daily   sodium zirconium cyclosilicate  10 g Oral TID   Continuous Infusions:  sodium chloride     cefTRIAXone (ROCEPHIN)  IV 1 g (06/06/23 1719)     LOS: 2 days    Osvaldo Shipper,  [facility] 06/07/2023, 10:09 AM   Available via Epic secure chat 7am-7pm After these hours, please refer to coverage provider listed on [email or web address]

## 2023-06-07 NOTE — Plan of Care (Signed)
  Problem: Activity: Goal: Risk for activity intolerance will decrease Outcome: Progressing   Problem: Nutrition: Goal: Adequate nutrition will be maintained Outcome: Progressing   Problem: Coping: Goal: Level of anxiety will decrease Outcome: Progressing   Problem: Pain Managment: Goal: General experience of comfort will improve and/or be controlled Outcome: Progressing   Problem: Safety: Goal: Ability to remain free from injury will improve Outcome: Progressing

## 2023-06-07 NOTE — Progress Notes (Signed)
PHARMACY NOTE:  ANTIMICROBIAL RENAL DOSAGE ADJUSTMENT  Current antimicrobial regimen includes a mismatch between antimicrobial dosage and estimated renal function.  As per policy approved by the Pharmacy & Therapeutics and Medical Executive Committees, the antimicrobial dosage will be adjusted accordingly.  Current antimicrobial dosage:  Cefadroxil 500 BID  Indication: Proteus UTI  Renal Function:  Estimated Creatinine Clearance: 16 mL/min (A) (by C-G formula based on SCr of 2.45 mg/dL (H)). []      On intermittent HD, scheduled: []      On CRRT    Antimicrobial dosage has been changed to:  Cefadroxil 500 daily per [facility] antibiotic dosing guidance  Additional comments: Address length of therapy (typically 5-7 days)   Thank you for allowing pharmacy to be a part of this patient's care.  Link Snuffer, PharmD, BCPS, BCCCP Please refer to [facility] for [facility] numbers 06/07/2023 1:19 PM

## 2023-06-07 NOTE — Progress Notes (Signed)
Transition of Care [facility]) - Inpatient Brief Assessment   Patient Details  Name: [name] MRN: [id number] Date of Birth: 08/26/1934  Transition of Care [facility]) CM/SW Contact:    Janae Bridgeman, RN Phone Number: 06/07/2023, 4:41 PM   Clinical Narrative: CM met with the patient and daughter at the bedside to discuss TOC needs.  The patient lives with her daughter at the home and admitted after fall and likely UTI.  Patient plans to return home with home health services when medically stable.  DME at the home includes WC, 3:1, Rolator, Shower seat.   Patient was offered Medicare choice regarding home health services and she states that she was recently active with [facility] and was pleased with services.  HH orders for PT/OT to be ordered and co-signed by the MD.  I called Lysbeth Galas, CM and she accepted for services.  When patient is medically stable, she plans to discharge home by car.   Transition of Care Asessment: Insurance and Status: (P) Insurance coverage has been reviewed Patient has primary care physician: (P) Yes Home environment has been reviewed: (P) from home with daughter Prior level of function:: (P) family assistance Prior/Current Home Services: (P) No current home services Social Drivers of Health Review: (P) SDOH reviewed interventions complete Readmission risk has been reviewed: (P) Yes Transition of care needs: (P) transition of care needs identified, TOC will continue to follow

## 2023-06-08 ENCOUNTER — Encounter: Payer: Self-pay | Admitting: Hematology

## 2023-06-08 ENCOUNTER — Encounter (HOSPITAL_COMMUNITY): Payer: Self-pay

## 2023-06-08 ENCOUNTER — Other Ambulatory Visit (HOSPITAL_COMMUNITY): Payer: Self-pay

## 2023-06-08 DIAGNOSIS — E875 Hyperkalemia: Secondary | ICD-10-CM | POA: Diagnosis not present

## 2023-06-08 LAB — BASIC METABOLIC PANEL
Anion gap: 9 (ref 5–15)
BUN: 69 mg/dL — ABNORMAL HIGH (ref 8–23)
CO2: 21 mmol/L — ABNORMAL LOW (ref 22–32)
Calcium: 8.2 mg/dL — ABNORMAL LOW (ref 8.9–10.3)
Chloride: 108 mmol/L (ref 98–111)
Creatinine, Ser: 2.01 mg/dL — ABNORMAL HIGH (ref 0.44–1.00)
GFR, Estimated: 24 mL/min — ABNORMAL LOW (ref >60)
Glucose, Bld: 123 mg/dL — ABNORMAL HIGH (ref 70–99)
Potassium: 4.8 mmol/L (ref 3.5–5.1)
Sodium: 138 mmol/L (ref 135–145)

## 2023-06-08 LAB — GLUCOSE, CAPILLARY
Glucose-Capillary: 115 mg/dL — ABNORMAL HIGH (ref 70–99)
Glucose-Capillary: 124 mg/dL — ABNORMAL HIGH (ref 70–99)
Glucose-Capillary: 189 mg/dL — ABNORMAL HIGH (ref 70–99)

## 2023-06-08 LAB — MAGNESIUM: Magnesium: 1.4 mg/dL — ABNORMAL LOW (ref 1.7–2.4)

## 2023-06-08 MED ORDER — CEFADROXIL 500 MG PO CAPS
500.0000 mg | ORAL_CAPSULE | Freq: Every day | ORAL | 0 refills | Status: AC
  Filled 2023-06-08: qty 5, 5d supply, fill #0

## 2023-06-08 MED ORDER — MELATONIN 5 MG PO TABS
5.0000 mg | ORAL_TABLET | Freq: Every evening | ORAL | 0 refills | Status: DC | PRN
  Filled 2023-06-08: qty 15, 15d supply, fill #0

## 2023-06-08 MED ORDER — BUTALBITAL-APAP-CAFFEINE 50-325-40 MG PO TABS
1.0000 | ORAL_TABLET | Freq: Four times a day (QID) | ORAL | 0 refills | Status: DC | PRN
  Filled 2023-06-08: qty 20, 5d supply, fill #0

## 2023-06-08 MED ORDER — MAGNESIUM SULFATE IN D5W 1-5 GM/100ML-% IV SOLN
1.0000 g | Freq: Once | INTRAVENOUS | Status: AC
  Administered 2023-06-08: 1 g via INTRAVENOUS
  Filled 2023-06-08: qty 100

## 2023-06-08 NOTE — Progress Notes (Signed)
Physical Therapy Treatment Patient Details Name: [name] MRN: [id number] DOB: 01/07/1935 Today's Date: 06/08/2023   History of Present Illness Pt is a 89 yr old female who presented on 06/04/23 due to having a headache after they had a fall 6 days prior. CT was negative. She was found to have an UTI, AKI and hyperkalemia.  PMH: chronic HFpEF, DM2, CAD (BMS to RCA 2004, DESx2 to mid and proximal RCA 2012, recent NSTEMI 02/2020 s/p DES to RCA), COPD, CKD stage IIIa, IDDM, HTN, HLD, anemia of chronic disease, prior PE/DVT, left breast cancer, depression/anxiety, chronic pain, dementia    PT Comments  Pt tolerates treatment well, demonstrating improvement in transfer quality. Pt provides instruction in how the pt typically transfers at home, with wheelchair in front and assist to facilitate anterior weight shift and trunk flexion from caregivers. Pt stands and pivots to recliner well and reports increased confidence in her mobility today. PT will continue to follow but recommends discharge home with HHPT when medically ready.   If plan is discharge home, recommend the following: A lot of help with walking and/or transfers;A lot of help with bathing/dressing/bathroom;Assistance with cooking/housework;Help with stairs or ramp for entrance   Can travel by private vehicle        Equipment Recommendations  None recommended by PT    Recommendations for Other Services       Precautions / Restrictions Precautions Precautions: Fall Precaution Comments: Pt had a fall prior to admission Restrictions Weight Bearing Restrictions Per Provider Order: No     Mobility  Bed Mobility Overal bed mobility: Needs Assistance Bed Mobility: Supine to Sit     Supine to sit: Mod assist, HOB elevated, Used rails     General bed mobility comments: assist via hand hold for pt to pull into sitting, assist via bed pad to pivot hips. Pt reports requiring assist for bed mobility at baseline     Transfers Overall transfer level: Needs assistance Equipment used: 1 person hand held assist Transfers: Sit to/from Stand, Bed to chair/wheelchair/BSC Sit to Stand: Mod assist   Step pivot transfers: Min assist       General transfer comment: pt typically placed wheelchair in front of herself, holds onto wheelchair armrests and utilizes push from caregivers in the back to facilitate increased anterior lean and trunk flexion. Pt is able to transfer with reduced assistance this session utilizing this method.    Ambulation/Gait                   Stairs             Wheelchair Mobility     Tilt Bed    Modified Rankin (Stroke Patients Only)       Balance Overall balance assessment: Needs assistance Sitting-balance support: No upper extremity supported, Feet supported Sitting balance-Leahy Scale: Fair     Standing balance support: Single extremity supported, Reliant on assistive device for balance Standing balance-Leahy Scale: Poor Standing balance comment: minA                            Cognition Arousal: Alert Behavior During Therapy: WFL for tasks assessed/performed Overall Cognitive Status: History of cognitive impairments - at baseline                                 General Comments: appropriate for this session, no family present  Exercises      General Comments General comments (skin integrity, edema, etc.): VSS on RA      Pertinent Vitals/Pain Pain Assessment Pain Assessment: No/denies pain    Home Living                          Prior Function            PT Goals (current goals can now be found in the care plan section) Acute Rehab PT Goals Patient Stated Goal: return home Progress towards PT goals: Progressing toward goals    Frequency    Min 1X/week      PT Plan      Co-evaluation              AM-PAC PT "6 Clicks" Mobility   Outcome Measure  Help needed turning  from your back to your side while in a flat bed without using bedrails?: A Lot Help needed moving from lying on your back to sitting on the side of a flat bed without using bedrails?: A Lot Help needed moving to and from a bed to a chair (including a wheelchair)?: A Little Help needed standing up from a chair using your arms (e.g., wheelchair or bedside chair)?: A Lot Help needed to walk in hospital room?: Total Help needed climbing 3-5 steps with a railing? : Total 6 Click Score: 11    End of Session Equipment Utilized During Treatment:  (pt refuses use of gait belt) Activity Tolerance: Patient tolerated treatment well Patient left: in chair;with call bell/phone within reach Nurse Communication: Mobility status PT Visit Diagnosis: Other abnormalities of gait and mobility (R26.89);Muscle weakness (generalized) (M62.81);History of falling (Z91.81)     Time: 1610-9604 PT Time Calculation (min) (ACUTE ONLY): 20 min  Charges:    $Therapeutic Activity: 8-22 mins PT General Charges $$ ACUTE PT VISIT: 1 Visit                     Arlyss Gandy, PT, DPT Acute Rehabilitation Office [phone]    Arlyss Gandy 06/08/2023, 9:21 AM

## 2023-06-08 NOTE — Progress Notes (Signed)
Attempt x1 to administer inhalers.

## 2023-06-08 NOTE — Care Management Important Message (Signed)
Important Message  Patient Details  Name: [name] MRN: [id number] Date of Birth: 10/21/1934   Important Message Given:  Yes - Medicare IM  Patient left prior to IM delivery will mail copy of IM to the patient home address.    Amere Bricco 06/08/2023, 3:01 PM

## 2023-06-08 NOTE — Progress Notes (Signed)
Reviewed AVS, patient expressed understanding of medications, MD follow up reviewed.  Removed IV, Site clean, dry and intact.  Picked up medications from TOC pharm, Pt transported to entrance C where family member was waiting in vehicle to transport home.

## 2023-06-08 NOTE — Discharge Summary (Signed)
[facility]  Physician Discharge Summary   Patient ID: [name] MRN: [id number] DOB/AGE: [birth year] 89 y.o.  Admit date: 06/04/2023 Discharge date: 06/08/2023    PCP: [name], MD  DISCHARGE DIAGNOSES:    Hyperkalemia   Essential hypertension   Chronic diastolic CHF (congestive heart failure) (HCC)   COPD with chronic bronchitis (HCC)   Type 2 diabetes mellitus with hyperlipidemia (HCC)   Acute kidney injury superimposed on chronic kidney disease (HCC)   Dementia (HCC)   Hyperlipidemia   History of DVT (deep vein thrombosis)   Long term current use of anticoagulant therapy   GERD with esophagitis   CKD (chronic kidney disease), stage IV (HCC)   Type 2 diabetes mellitus with stage 4 chronic kidney disease, with long-term current use of insulin (HCC)   RECOMMENDATIONS FOR OUTPATIENT FOLLOW UP: Patient has an appointment with her nephrologist in the next 2 weeks.   Home Health: PT and OT Equipment/Devices: None  CODE STATUS: Full code  DISCHARGE CONDITION: fair  Diet recommendation: As before  INITIAL HISTORY: [name] is a 89 y.o. female with medical history significant for obesity, type 2 diabetes, hypertension, history of vertigo, CKD 4, anemia of chronic disease, DVT on Eliquis, chronic HFpEF, who presents to the ER with complaints of headache that began after a fall 6 days ago.  She tripped and fell to the ground striking the right side of her head on the closet door.  Did not lose consciousness.  Associated with nausea without vomiting.  She presented to the ER for further evaluation. In the emergency department, patient was found to have urinary tract infection, acute kidney injury as well as hyperkalemia.   HOSPITAL COURSE:   Hyperkalemia Etiology for hyperkalemia is not entirely clear.  There is mention that she was consuming a lot of orange juice.  Home medication list reviewed.  Patient is not on any potassium supplements.  Patient  is on torsemide but not on spironolactone. Came in with potassium level of 5.6 which increased to 6.6 and then 6.8.  Patient was given IV furosemide Lokelma calcium gluconate and insulin/D50.  Given additional doses of Lokelma.  Potassium levels have improved.  She was counseled not to consume a lot of juice.  She has an appointment coming up with her nephrologist in the next 2 weeks.   AKI on CKD stage IV Review of old labs suggest that her baseline creatinine is anywhere between 2-2.7.  Also has elevated BUN at baseline. States that she follows with [facility] as outpatient.   UTI, POA -No sepsis on presentation -Has symptoms of dysuria as well as bacteriuria on UA Urine culture positive for Proteus.  Sensitivities reviewed.  Will be discharged on cefadroxil.   Headache Denies any visual disturbances.  CT head was unremarkable.   Due to persistent symptoms she was offered MRI which she declined.  Headache has improved. Continue with Fioricet. ESR noted to be elevated but there are several reasons why this could be elevated in her case including acute infection.  Presentation not consistent with temporal arteritis.   Normocytic anemia Has a longstanding history of anemia.  Hemoglobin seems to be close to baseline.  No evidence of overt bleeding.     Diabetes mellitus type 2 with hyperglycemia   Hyperlipidemia -Lipitor   History of DVT -Eliquis   Chronic diastolic CHF -Without acute exacerbation, appears dry on examination -Coreg   Patient feels better.  Potassium level has improved.  Okay  for discharge home today.  Daughter was updated.  PERTINENT LABS:  The results of significant diagnostics from this hospitalization (including imaging, microbiology, ancillary and laboratory) are listed below for reference.    Microbiology: Recent Results (from the past 240 hours)  Resp panel by RT-PCR (RSV, Flu A&B, Covid) Anterior Nasal Swab     Status: None   Collection  Time: 06/04/23  6:48 PM   Specimen: Anterior Nasal Swab  Result Value Ref Range Status   SARS Coronavirus 2 by RT PCR NEGATIVE NEGATIVE Final   Influenza A by PCR NEGATIVE NEGATIVE Final   Influenza B by PCR NEGATIVE NEGATIVE Final    Comment: (NOTE) The Xpert Xpress SARS-CoV-2/FLU/RSV plus assay is intended as an aid in the diagnosis of influenza from Nasopharyngeal swab specimens and should not be used as a sole basis for treatment. Nasal washings and aspirates are unacceptable for Xpert Xpress SARS-CoV-2/FLU/RSV testing.  Fact Sheet for Patients: BloggerCourse.com  Fact Sheet for Healthcare Providers: SeriousBroker.it  This test is not yet approved or cleared by the [place] FDA and has been authorized for detection and/or diagnosis of SARS-CoV-2 by FDA under an Emergency Use Authorization (EUA). This EUA will remain in effect (meaning this test can be used) for the duration of the COVID-19 declaration under Section 564(b)(1) of the Act, 21 U.S.C. section 360bbb-3(b)(1), unless the authorization is terminated or revoked.     Resp Syncytial Virus by PCR NEGATIVE NEGATIVE Final    Comment: (NOTE) Fact Sheet for Patients: BloggerCourse.com  Fact Sheet for Healthcare Providers: SeriousBroker.it  This test is not yet approved or cleared by the [place] FDA and has been authorized for detection and/or diagnosis of SARS-CoV-2 by FDA under an Emergency Use Authorization (EUA). This EUA will remain in effect (meaning this test can be used) for the duration of the COVID-19 declaration under Section 564(b)(1) of the Act, 21 U.S.C. section 360bbb-3(b)(1), unless the authorization is terminated or revoked.  Performed at [facility], [address] [place], [place] [address]   Urine Culture     Status: Abnormal   Collection Time: 06/05/23 12:37 AM   Specimen:  Urine, Random  Result Value Ref Range Status   Specimen Description URINE, RANDOM  Final   Special Requests   Final    NONE Reflexed from [id number] Performed at [facility], [address] [place], [place] [address]    Culture >=100,000 COLONIES/mL PROTEUS MIRABILIS (A)  Final   Report Status 06/07/2023 FINAL  Final   Organism ID, Bacteria PROTEUS MIRABILIS (A)  Final      Susceptibility   Proteus mirabilis - MIC*    AMPICILLIN <=2 SENSITIVE Sensitive     CEFAZOLIN <=4 SENSITIVE Sensitive     CEFEPIME <=0.12 SENSITIVE Sensitive     CEFTRIAXONE <=0.25 SENSITIVE Sensitive     CIPROFLOXACIN <=0.25 SENSITIVE Sensitive     GENTAMICIN <=1 SENSITIVE Sensitive     IMIPENEM 2 SENSITIVE Sensitive     NITROFURANTOIN RESISTANT Resistant     TRIMETH/SULFA <=20 SENSITIVE Sensitive     AMPICILLIN/SULBACTAM <=2 SENSITIVE Sensitive     PIP/TAZO <=4 SENSITIVE Sensitive ug/mL    * >=100,000 COLONIES/mL PROTEUS MIRABILIS     Labs:   Basic Metabolic Panel: Recent Labs  Lab 06/04/23 1315 06/04/23 2134 06/04/23 2238 06/05/23 0237 06/05/23 8657 06/05/23 2031 06/06/23 8469 06/06/23 0612 06/07/23 0611 06/08/23 0705  NA 136   < > 137 131*  --   --  135  --  137 138  K 5.6*   < > 7.2* 6.8*   < > 6.8* 5.7* 5.7* 5.2* 4.8  CL 107   < > 109 99  --   --  104  --  107 108  CO2 20*  --   --  20*  --   --  21*  --  20* 21*  GLUCOSE 210*   < > 257* 270*  --   --  164*  --  140* 123*  BUN 75*   < > 71* 71*  --   --  76*  --  81* 69*  CREATININE 2.49*   < > 2.60* 2.29*  --   --  2.50*  --  2.45* 2.01*  CALCIUM 8.9  --   --  9.5  --   --  8.5*  --  8.1* 8.2*  MG  --   --   --  1.4*  --   --  1.5*  --   --  1.4*  PHOS  --   --   --  3.7  --   --   --   --   --   --    < > = values in this interval not displayed.   Liver Function Tests: Recent Labs  Lab 06/05/23 0237  AST 16  ALT 12  ALKPHOS 94  BILITOT 0.5  PROT 7.9  ALBUMIN 3.1*    CBC: Recent Labs  Lab 06/04/23 1315 06/04/23 2134  06/04/23 2226 06/04/23 2238 06/05/23 0237 06/06/23 0611 06/07/23 0611  WBC 8.7  --  12.4*  --  11.5* 15.1* 11.5*  NEUTROABS 5.1  --   --   --   --   --   --   HGB 9.0*   < > 9.9* 11.2* 9.5* 7.7* 8.1*  HCT 29.7*   < > 33.2* 33.0* 30.4* 24.5* 25.6*  MCV 95.8  --  97.9  --  94.4 93.2 93.4  PLT 248  --  238  --  242 216 223   < > = values in this interval not displayed.     CBG: Recent Labs  Lab 06/07/23 1559 06/07/23 2157 06/08/23 0027 06/08/23 0604 06/08/23 1024  GLUCAP 204* 175* 115* 124* 189*     IMAGING STUDIES CT ABDOMEN PELVIS WO CONTRAST Result Date: 06/04/2023 CLINICAL DATA:  Acute abdominal pain EXAM: CT ABDOMEN AND PELVIS WITHOUT CONTRAST TECHNIQUE: Multidetector CT imaging of the abdomen and pelvis was performed following the standard protocol without IV contrast. RADIATION DOSE REDUCTION: This exam was performed according to the departmental dose-optimization program which includes automated exposure control, adjustment of the mA and/or kV according to patient size and/or use of iterative reconstruction technique. COMPARISON:  CT abdomen and pelvis 12/26/2022 FINDINGS: Lower chest: No acute abnormality. Hepatobiliary: Gallstones are present. There is no biliary ductal dilatation. The liver is within normal limits. Pancreas: Unremarkable. No pancreatic ductal dilatation or surrounding inflammatory changes. Spleen: Normal in size without focal abnormality. Adrenals/Urinary Tract: Bilateral renal cysts are present measuring up to 3.5 cm in the right kidney and 2.3 cm in the left kidney. There is no hydronephrosis or urinary tract calculus. The adrenal glands and bladder are within normal limits. Stomach/Bowel: No evidence of bowel wall thickening, distention, or inflammatory changes. Small bowel anastomosis is noted in the left abdomen. The appendix is not visualized. There surgical changes of the gastroesophageal junction. There is a small hiatal hernia. Vascular/Lymphatic:  Aortic atherosclerosis. No enlarged abdominal or pelvic  lymph nodes. Reproductive: The uterus is mildly enlarged and heterogeneous with punctate calcifications likely related to fibroid change. The adnexa are within normal limits. Other: No abdominal wall hernia or abnormality. No abdominopelvic ascites. There sutures along the anterior abdominal wall. Musculoskeletal: There severe degenerative changes of the spine. IMPRESSION: 1. No acute localizing process in the abdomen or pelvis. 2. Cholelithiasis. 3. Small hiatal hernia. 4. Fibroid uterus. 5. Aortic atherosclerosis. Aortic Atherosclerosis (ICD10-I70.0). Electronically Signed   By: Darliss Cheney M.D.   On: 06/04/2023 23:39   CT Cervical Spine Wo Contrast Result Date: 06/04/2023 CLINICAL DATA:  Neck trauma.  Fall 6 days ago with head injury. EXAM: CT CERVICAL SPINE WITHOUT CONTRAST TECHNIQUE: Multidetector CT imaging of the cervical spine was performed without intravenous contrast. Multiplanar CT image reconstructions were also generated. RADIATION DOSE REDUCTION: This exam was performed according to the departmental dose-optimization program which includes automated exposure control, adjustment of the mA and/or kV according to patient size and/or use of iterative reconstruction technique. COMPARISON:  Cervical spine radiographs 02/02/2020. FINDINGS: Alignment: Normal. Skull base and vertebrae: No evidence of acute fracture or traumatic subluxation. Soft tissues and spinal canal: No prevertebral fluid or swelling. No visible canal hematoma. Disc levels: The cervical disc heights are relatively maintained. There is mild multilevel spondylosis with disc bulging, uncinate spurring and facet hypertrophy. No large disc herniation or high-grade spinal stenosis demonstrated. Upper chest: Clear lung apices. Other: Bilateral carotid atherosclerosis. IMPRESSION: 1. No evidence of acute cervical spine fracture, traumatic subluxation or static signs of instability. 2.  Mild multilevel cervical spondylosis. Electronically Signed   By: Carey Bullocks M.D.   On: 06/04/2023 14:49   CT Head Wo Contrast Result Date: 06/04/2023 CLINICAL DATA:  Head trauma, minor (Age >= 65y) EXAM: CT HEAD WITHOUT CONTRAST TECHNIQUE: Contiguous axial images were obtained from the base of the skull through the vertex without intravenous contrast. RADIATION DOSE REDUCTION: This exam was performed according to the departmental dose-optimization program which includes automated exposure control, adjustment of the mA and/or kV according to patient size and/or use of iterative reconstruction technique. COMPARISON:  09/12/2022 FINDINGS: Brain: No evidence of acute infarction, hemorrhage, hydrocephalus, extra-axial collection or mass lesion/mass effect. Patchy low-density changes within the periventricular and subcortical white matter most compatible with chronic microvascular ischemic change. Mild diffuse cerebral volume loss. Vascular: Atherosclerotic calcifications involving the large vessels of the skull base. No unexpected hyperdense vessel. Skull: Normal. Negative for fracture or focal lesion. Sinuses/Orbits: Mucosal thickening of the right frontal sinus and within the anterior right ethmoid air cells. Otherwise clear. Other: Negative for scalp hematoma. IMPRESSION: 1. No acute intracranial findings. 2. Chronic microvascular ischemic change and cerebral volume loss. Electronically Signed   By: Duanne Guess D.O.   On: 06/04/2023 14:48    DISCHARGE EXAMINATION: Vitals:   06/07/23 2100 06/08/23 0500 06/08/23 0807 06/08/23 1235  BP: 137/63 (!) 134/58 (!) 127/56 120/60  Pulse: 72 69 67 75  Resp:   18   Temp: 98.6 F (37 C) 98 F (36.7 C) 97.6 F (36.4 C) 97.7 F (36.5 C)  TempSrc: Oral Oral Oral Oral  SpO2: 100% 98% 96% 100%  Weight:  67.1 kg    Height:       General appearance: Awake alert.  In no distress Resp: Clear to auscultation bilaterally.  Normal effort Cardio: S1-S2 is  normal regular.  No S3-S4.  No rubs murmurs or bruit GI: Abdomen is soft.  Nontender nondistended.  Bowel sounds are present normal.  No  masses organomegaly   DISPOSITION: Home  Discharge Instructions     Call MD for:  difficulty breathing, headache or visual disturbances   Complete by: As directed    Call MD for:  extreme fatigue   Complete by: As directed    Call MD for:  persistant dizziness or light-headedness   Complete by: As directed    Call MD for:  persistant nausea and vomiting   Complete by: As directed    Call MD for:  severe uncontrolled pain   Complete by: As directed    Call MD for:  temperature >100.4   Complete by: As directed    Diet - low sodium heart healthy   Complete by: As directed    Discharge instructions   Complete by: As directed    Please take your medications as prescribed.  Please be sure to follow-up with the kidney doctor as scheduled in February.  Please avoid excessive intake of juices as they are typically high in potassium content.  You were cared for by a hospitalist during your hospital stay. If you have any questions about your discharge medications or the care you received while you were in the hospital after you are discharged, you can call the unit and asked to speak with the hospitalist on call if the hospitalist that took care of you is not available. Once you are discharged, your primary care physician will handle any further medical issues. Please note that NO REFILLS for any discharge medications will be authorized once you are discharged, as it is imperative that you return to your primary care physician (or establish a relationship with a primary care physician if you do not have one) for your aftercare needs so that they can reassess your need for medications and monitor your lab values. If you do not have a primary care physician, you can call [phone] for a physician referral.   Increase activity slowly   Complete by: As directed           Allergies as of 06/08/2023       Reactions   Sulfonamide Derivatives Swelling, Other (See Comments)   Mouth swelling- no resp issues noted   Duloxetine    Headaches   Lokelma [sodium Zirconium Cyclosilicate] Other (See Comments)   Headache, stomach upset   Aricept [donepezil Hcl] Diarrhea, Nausea And Vomiting, Other (See Comments)   GI upset/loose stools   Tramadol Nausea And Vomiting        Medication List     TAKE these medications    albuterol 108 (90 Base) MCG/ACT inhaler Commonly known as: ProAir HFA Inhale 1-2 puffs into the lungs every 6 (six) hours as needed for wheezing or shortness of breath.   amitriptyline 75 MG tablet Commonly known as: ELAVIL Take 1 tablet (75 mg total) by mouth at bedtime.   atorvastatin 80 MG tablet Commonly known as: LIPITOR TAKE 1 TABLET BY MOUTH EVERY DAY What changed: when to take this   azelastine 0.1 % nasal spray Commonly known as: ASTELIN Place 2 sprays into both nostrils 2 (two) times daily.   butalbital-acetaminophen-caffeine 50-325-40 MG tablet Commonly known as: FIORICET Take 1 tablet by mouth every 6 (six) hours as needed for headache.   carvedilol 12.5 MG tablet Commonly known as: COREG TAKE 1 TABLET BY MOUTH 2 TIMES DAILY   cefadroxil 500 MG capsule Commonly known as: DURICEF Take 1 capsule (500 mg total) by mouth daily for 5 days.   cetirizine 10 MG tablet Commonly  known as: ZYRTEC Take 10 mg by mouth daily as needed for allergies or rhinitis.   diclofenac Sodium 1 % Gel Commonly known as: Voltaren Apply 2 g topically 4 (four) times daily. What changed:  when to take this reasons to take this   docusate sodium 100 MG capsule Commonly known as: COLACE Take 2 capsules (200 mg total) by mouth daily.   Eliquis 5 MG Tabs tablet Generic drug: apixaban TAKE 1 TABLET BY MOUTH 2 TIMES DAILY   febuxostat 40 MG tablet Commonly known as: ULORIC TAKE 1 TABLET BY MOUTH EVERY DAY   fluticasone 50  MCG/ACT nasal spray Commonly known as: FLONASE Place 2 sprays into both nostrils daily for 14 days. What changed:  when to take this reasons to take this   folic acid 1 MG tablet Commonly known as: FOLVITE TAKE 1 TABLET BY MOUTH EVERY DAY   glucagon 1 MG injection Inject 1 mg into the vein once as needed for up to 1 dose.   glucose 4 GM chewable tablet Chew 1 tablet (4 g total) by mouth as needed for low blood sugar.   hydrOXYzine 25 MG tablet Commonly known as: ATARAX TAKE 1 TABLET BY MOUTH EVERY 8 HOURS AS NEEDED FOR ANXIETY OR ITCHING (NASAL AND THROAT CONGESTION) What changed: See the new instructions.   ipratropium-albuterol 0.5-2.5 (3) MG/3ML Soln Commonly known as: DUONEB Take 3 mLs by nebulization every 6 (six) hours as needed (cough, wheezing, or shortness of breath).   linaclotide 145 MCG Caps capsule Commonly known as: LINZESS Patient take 1 capsule daily.  May add an additional capsule for breakthrough constipation associated with opioid use What changed:  how much to take how to take this when to take this additional instructions   meclizine 25 MG tablet Commonly known as: ANTIVERT TAKE 1 TO 2 TABLETS BY MOUTH TWICE DAILY AS NEEDED FOR DIZZINESS What changed: See the new instructions.   melatonin 5 MG Tabs Take 1 tablet (5 mg total) by mouth at bedtime as needed.   memantine 5 MG tablet Commonly known as: NAMENDA TAKE 2 TABLETS BY MOUTH EVERY MORNING and TAKE 1 TABLET BY MOUTH EVERY EVENING What changed:  how much to take how to take this when to take this additional instructions   montelukast 10 MG tablet Commonly known as: SINGULAIR Take 1 tablet (10 mg total) by mouth at bedtime.   nitroGLYCERIN 0.4 MG SL tablet Commonly known as: Nitrostat Take one tablet under the tongue every 5 minutes as needed for chest pain What changed:  how much to take how to take this when to take this reasons to take this additional instructions   NovoLOG  FlexPen 100 UNIT/ML FlexPen Generic drug: insulin aspart Inject 14 Units into the skin 2 (two) times daily with a meal. Max daily dose 70 units What changed:  how much to take when to take this additional instructions   nystatin powder Commonly known as: MYCOSTATIN/NYSTOP Apply 1 Application topically 3 (three) times daily.   ondansetron 4 MG tablet Commonly known as: Zofran Take 1 tablet (4 mg total) by mouth every 8 (eight) hours as needed for nausea or vomiting.   Ozempic (0.25 or 0.5 MG/DOSE) 2 MG/3ML Sopn Generic drug: Semaglutide(0.25 or 0.5MG /DOS) Inject 0.5 mg into the skin once a week. What changed: when to take this   pantoprazole 20 MG tablet Commonly known as: PROTONIX TAKE 1 TABLET BY MOUTH EVERY DAY   torsemide 20 MG tablet Commonly known as: DEMADEX TAKE  1 TABLET BY MOUTH ONCE DAILY - MAY TAKE AN EXTRA DOES FOR WEIGHT GAIN OF 3LBs IN 1 DAY OR 5LBS IN 1 WEEK OR FOR WORSENING SWELLING   Toujeo Max SoloStar 300 UNIT/ML Solostar Pen Generic drug: insulin glargine (2 Unit Dial) Inject 40 Units into the skin daily. Patient assistance program provides. Patient reports 40 units 04/24/2022 What changed:  how much to take when to take this additional instructions   Trelegy Ellipta 100-62.5-25 MCG/ACT Aepb Generic drug: Fluticasone-Umeclidin-Vilant Inhale 1 puff into the lungs daily.   TYLENOL 500 MG tablet Generic drug: acetaminophen Take 1,000 mg by mouth every 6 (six) hours as needed for mild pain or headache.   Tylenol PM Extra Strength 500-25 MG Tabs tablet Generic drug: diphenhydramine-acetaminophen Take 2 tablets by mouth at bedtime.   Vitamin D (Ergocalciferol) 1.25 MG (50000 UNIT) Caps capsule Commonly known as: DRISDOL TAKE 1 CAPSULE BY MOUTH ONCE WEEKLY          Follow-up Information     [facility] Follow up.   Specialty: Home Health Services Why: [facility] home health will provide home health services.  They will  call you in the next 24-48 hours to set up services. Contact information: [address] [place] [address] [phone]         [name], MD. Schedule an appointment as soon as possible for a visit in 1 week(s).   Specialty: Family Medicine Why: post hospitalization follow up Contact information: [address] [place] [address] [phone]         Terrial Rhodes, MD Follow up.   Specialty: Nephrology Why: Keep the appointment in February Contact information: [address] [place] [address] [phone]                 TOTAL DISCHARGE TIME: 35 minutes  Yvette Roark Rito Ehrlich  [facility] Pager on [email or web address]  06/09/2023, 10:55 AM

## 2023-06-08 NOTE — Plan of Care (Signed)
  Problem: Fluid Volume: Goal: Ability to maintain a balanced intake and output will improve Outcome: Progressing   Problem: Metabolic: Goal: Ability to maintain appropriate glucose levels will improve Outcome: Progressing   Problem: Education: Goal: Knowledge of General Education information will improve Description: Including pain rating scale, medication(s)/side effects and non-pharmacologic comfort measures Outcome: Progressing   Problem: Clinical Measurements: Goal: Ability to maintain clinical measurements within normal limits will improve Outcome: Progressing   Problem: Safety: Goal: Ability to remain free from injury will improve Outcome: Progressing

## 2023-06-11 ENCOUNTER — Telehealth: Payer: Self-pay | Admitting: *Deleted

## 2023-06-11 ENCOUNTER — Other Ambulatory Visit: Payer: Self-pay | Admitting: Internal Medicine

## 2023-06-11 NOTE — Transitions of Care (Post Inpatient/ED Visit) (Signed)
06/11/2023  Name: [name] MRN: [id number] DOB: [birth year]  Today's TOC FU Call Status: Today's TOC FU Call Status:: Successful TOC FU Call Completed TOC FU Call Complete Date: 06/11/23 Patient's Name and Date of Birth confirmed.  Transition Care Management Follow-up Telephone Call Date of Discharge: 06/08/23 Discharge Facility: [facility]) How have you been since you were released from the hospital?: Better (She is better but she she still has a headache)  Items Reviewed: Did you receive and understand the discharge instructions provided?: Yes Medications obtained,verified, and reconciled?: Yes (Medications Reviewed) Any new allergies since your discharge?: No Dietary orders reviewed?: No Do you have support at home?: Yes People in Home: child(ren), adult Name of Support/Comfort Primary Source: Rinaldo Cloud  Medications Reviewed Today: Medications Reviewed Today     Reviewed by Luella Cook, RN (Case Manager) on 06/11/23 at 1433  Med List Status: <None>   Medication Order Taking? Sig Documenting Provider Last Dose Status Informant  albuterol (PROAIR HFA) 108 (90 Base) MCG/ACT inhaler [id number] Yes Inhale 1-2 puffs into the lungs every 6 (six) hours as needed for wheezing or shortness of breath. Sharon Seller, NP Taking Active Family Member, Pharmacy Records           Med Note [place], [facility] D   Tue Jun 05, 2023  9:16 AM) Unknown last dose  amitriptyline (ELAVIL) 75 MG tablet [id number]  Take 1 tablet (75 mg total) by mouth at bedtime. [name], MD  Expired 06/05/23 2359 Family Member, Pharmacy Records  atorvastatin (LIPITOR) 80 MG tablet [id number] Yes TAKE 1 TABLET BY MOUTH EVERY DAY  Patient taking differently: Take 80 mg by mouth at bedtime.   [name], MD Taking Active Family Member, Pharmacy Records  azelastine (ASTELIN) 0.1 % nasal spray [id number] Yes Place 2 sprays into both nostrils 2 (two) times daily. [name], MD Taking Active  Family Member, Pharmacy Records  butalbital-acetaminophen-caffeine [facility]) [phone] MG tablet [id number] Yes Take 1 tablet by mouth every 6 (six) hours as needed for headache. Osvaldo Shipper, MD Taking Active   carvedilol (COREG) 12.5 MG tablet [id number] Yes TAKE 1 TABLET BY MOUTH 2 TIMES DAILY [name], MD Taking Active Family Member, Pharmacy Records  cefadroxil (DURICEF) 500 MG capsule [id number] Yes Take 1 capsule (500 mg total) by mouth daily for 5 days. Osvaldo Shipper, MD Taking Active   cetirizine (ZYRTEC) 10 MG tablet [id number] Yes Take 10 mg by mouth daily as needed for allergies or rhinitis. [provider] Taking Active Family Member, Pharmacy Records  diclofenac Sodium (VOLTAREN) 1 % GEL [id number] Yes Apply 2 g topically 4 (four) times daily.  Patient taking differently: Apply 2 g topically 4 (four) times daily as needed (pain).   [name], MD Taking Active Family Member, Pharmacy Records  docusate sodium (COLACE) 100 MG capsule [id number] Yes Take 2 capsules (200 mg total) by mouth daily. [name], MD Taking Active Family Member, Pharmacy Records  [place] [address] [place] TABS tablet [id number] Yes TAKE 1 TABLET BY MOUTH 2 TIMES DAILY [name], MD Taking Active Family Member, Pharmacy Records  febuxostat [facility]) 40 MG tablet [id number] Yes TAKE 1 TABLET BY MOUTH EVERY DAY [name], MD Taking Active Family Member, Pharmacy Records  fluticasone [facility]) 50 MCG/ACT nasal spray [id number]  Place 2 sprays into both nostrils daily for 14 days.  Patient taking differently: Place 2 sprays into both nostrils daily as needed for allergies.   Salvatore Decent,  FNP  Expired 06/05/23 2359 Family Member, Pharmacy Records  Fluticasone-Umeclidin-Vilant [facility] ELLIPTA) 100-62.5-25 MCG/ACT AEPB [id number] Yes Inhale 1 puff into the lungs daily. [name], MD Taking Active Family Member, Pharmacy Records  folic acid (FOLVITE) 1 MG tablet [id number] Yes TAKE 1  TABLET BY MOUTH EVERY DAY [name], MD Taking Active Family Member, Pharmacy Records  glucagon 1 MG injection [id number]  Inject 1 mg into the vein once as needed for up to 1 dose. [name], MD  Active Family Member, Pharmacy Records           Med Note [place], [facility] D   Tue Jun 05, 2023 10:02 AM) Unknown last dose patient has it home if needed  glucose 4 GM chewable tablet [id number]  Chew 1 tablet (4 g total) by mouth as needed for low blood sugar. [name], MD  Active Family Member, Pharmacy Records  hydrOXYzine (ATARAX) 25 MG tablet [id number] Yes TAKE 1 TABLET BY MOUTH EVERY 8 HOURS AS NEEDED FOR ANXIETY OR ITCHING (NASAL AND THROAT CONGESTION) [name], MD Taking Active   insulin glargine, 2 Unit Dial, (TOUJEO MAX SOLOSTAR) 300 UNIT/ML Solostar Pen [id number] Yes Inject 40 Units into the skin daily. Patient assistance program provides. Patient reports 40 units 04/24/2022  Patient taking differently: Inject 32 Units into the skin daily before breakfast.   Shamleffer, Konrad Dolores, MD Taking Active Family Member, Pharmacy Records  ipratropium-albuterol (DUONEB) 0.5-2.5 (3) MG/3ML SOLN [id number]  Take 3 mLs by nebulization every 6 (six) hours as needed (cough, wheezing, or shortness of breath). [name], MD  Expired 06/05/23 2359 Family Member, Pharmacy Records           Med Note [place], [facility] D   Tue Jun 05, 2023 10:03 AM) Unknown last dose  linaclotide [facility]) 145 MCG CAPS capsule [id number] Yes Patient take 1 capsule daily.  May add an additional capsule for breakthrough constipation associated with opioid use  Patient taking differently: Take 145 mcg by mouth every other day.   [name], MD Taking Active Family Member, Pharmacy Records  meclizine (ANTIVERT) 25 MG tablet [id number] Yes TAKE 1 TO 2 TABLETS BY MOUTH TWICE DAILY AS NEEDED FOR DIZZINESS [name], MD Taking Active   melatonin 5 MG TABS [id number] Yes Take 1 tablet (5 mg  total) by mouth at bedtime as needed. Osvaldo Shipper, MD Taking Active   memantine [facility]) 5 MG tablet [id number] Yes TAKE 2 TABLETS BY MOUTH EVERY MORNING and TAKE 1 TABLET BY MOUTH EVERY EVENING  Patient taking differently: Take 5-10 mg by mouth See admin instructions. Take 10 mg by mouth in the morning and 5 mg in the evening   [name], MD Taking Active Family Member, Pharmacy Records  montelukast (SINGULAIR) 10 MG tablet [id number] Yes Take 1 tablet (10 mg total) by mouth at bedtime. [name], MD Taking Active Family Member, Pharmacy Records           Med Note [place], [facility] D   Tue Jun 05, 2023 10:04 AM) Unknown last dose  nitroGLYCERIN (NITROSTAT) 0.4 MG SL tablet [id number] Yes Take one tablet under the tongue every 5 minutes as needed for chest pain  Patient taking differently: Place 0.4 mg under the tongue every 5 (five) minutes as needed for chest pain.   Sharon Seller, NP Taking Active Family Member, Pharmacy Records           Med Note [place], [facility] D  Tue Jun 05, 2023 10:05 AM) Unknown last dose  NOVOLOG FLEXPEN 100 UNIT/ML FlexPen [id number] Yes Inject 14 Units into the skin 2 (two) times daily with a meal. Max daily dose 70 units  Patient taking differently: Inject 12-70 Units into the skin 3 (three) times daily with meals. Per sliding scale   Shamleffer, Konrad Dolores, MD Taking Active Family Member, Pharmacy Records  nystatin (MYCOSTATIN/NYSTOP) powder [id number] Yes Apply 1 Application topically 3 (three) times daily. Salvatore Decent, FNP Taking Active Family Member, Pharmacy Records  ondansetron [facility]) 4 MG tablet [id number] Yes Take 1 tablet (4 mg total) by mouth every 8 (eight) hours as needed for nausea or vomiting. [name], MD Taking Active Family Member, Pharmacy Records           Med Note [place], [facility] D   Tue Jun 05, 2023 10:14 AM) Unknown last dose  pantoprazole (PROTONIX) 20 MG tablet [id number] Yes TAKE 1 TABLET BY MOUTH EVERY DAY  [name], MD Taking Active Family Member, Pharmacy Records  Semaglutide,0.25 or 0.5MG /DOS, (OZEMPIC, 0.25 OR 0.5 MG/DOSE,) 2 MG/3ML SOPN [id number] Yes Inject 0.5 mg into the skin once a week.  Patient taking differently: Inject 0.5 mg into the skin every Monday.   Shamleffer, Konrad Dolores, MD Taking Active Family Member, Pharmacy Records           Med Note Antony Madura, Melody Comas Jan 03, 2023  9:44 PM)    torsemide (DEMADEX) 20 MG tablet [id number] Yes TAKE 1 TABLET BY MOUTH ONCE DAILY - MAY TAKE AN EXTRA DOES FOR WEIGHT GAIN OF 3LBs IN 1 DAY OR 5LBS IN 1 WEEK OR FOR WORSENING SWELLING [name], MD Taking Active Family Member, Pharmacy Records  TYLENOL 500 MG tablet [id number] Yes Take 1,000 mg by mouth every 6 (six) hours as needed for mild pain or headache. [provider] Taking Active Family Member, Pharmacy Records  TYLENOL PM EXTRA STRENGTH 500-25 MG TABS tablet [id number] Yes Take 2 tablets by mouth at bedtime. [provider] Taking Active Family Member, Pharmacy Records  Vitamin D, Ergocalciferol, (DRISDOL) 1.25 MG (50000 UNIT) CAPS capsule [id number] Yes TAKE 1 CAPSULE BY MOUTH ONCE WEEKLY Candise Che, Corene Cornea, MD Taking Active Family Member, Pharmacy Records  Med List Note Vernie Ammons, RN 06/15/22 1332): MR 10/01/22.            Home Care and Equipment/Supplies: Were Home Health Services Ordered?: Yes Name of Home Health Agency:: [facility] Has Agency set up a time to come to your home?: Yes First Home Health Visit Date: 06/12/23 Any new equipment or medical supplies ordered?: NA  Functional Questionnaire: Do you need assistance with bathing/showering or dressing?: Yes Do you need assistance with meal preparation?: Yes Do you need assistance with eating?: No Do you have difficulty maintaining continence: No Do you need assistance with getting out of bed/getting out of a chair/moving?: No Do you have difficulty managing or taking your  medications?: Yes  Follow up appointments reviewed: PCP Follow-up appointment confirmed?: Yes Date of PCP follow-up appointment?: 06/12/23 Follow-up Provider: Nadene Rubins Specialist [facility] Follow-up appointment confirmed?: Yes Date of Specialist follow-up appointment?: 06/13/23 Follow-Up Specialty Provider:: [id number] Dr Mayer/ [id number] Oncology Do you need transportation to your follow-up appointment?: No Do you understand care options if your condition(s) worsen?: Yes-patient verbalized understanding  SDOH Interventions Today    Flowsheet Row Most Recent Value  SDOH Interventions   Food Insecurity Interventions Intervention Not Indicated  Housing Interventions Intervention Not  Indicated  Transportation Interventions Intervention Not Indicated, Patient Resources (Friends/Family)  Utilities Interventions Intervention Not Indicated      Interventions Today    Flowsheet Row Most Recent Value  Chronic Disease   Chronic disease during today's visit Other  [Hyperkalemia]  General Interventions   General Interventions Discussed/Reviewed General Interventions Discussed, General Interventions Reviewed, Doctor Visits, [facility], Communication with  [RN discussed HTA custodial care. RN discussed Child psychotherapist services]  Doctor Visits Discussed/Reviewed Doctor Visits Discussed, Doctor Visits Reviewed, PCP, Specialist  PCP/Specialist Visits Compliance with follow-up visit  Communication with RN  Marton Redwood [place]ath Care Coordinator]  Education Interventions   Education Provided Provided Education  Provided Verbal Education On Insurance Plans  Mental Health Interventions   Mental Health Discussed/Reviewed Mental Health Discussed, Mental Health Reviewed  Pharmacy Interventions   Pharmacy Dicussed/Reviewed Pharmacy Topics Discussed, Pharmacy Topics Reviewed      [facility] Care Coordinator will follow up [id number] 10:30  Gean Maidens BSN RN [facility]  Health Care Management Coordinator [email or web address] Direct Dial: [phone]  Fax: [phone] Website: [email or web address]

## 2023-06-12 ENCOUNTER — Ambulatory Visit (INDEPENDENT_AMBULATORY_CARE_PROVIDER_SITE_OTHER): Payer: PPO | Admitting: Family Medicine

## 2023-06-12 ENCOUNTER — Encounter: Payer: Self-pay | Admitting: Family Medicine

## 2023-06-12 VITALS — BP 128/82 | HR 69 | Temp 97.5°F | Ht 67.0 in

## 2023-06-12 DIAGNOSIS — H6993 Unspecified Eustachian tube disorder, bilateral: Secondary | ICD-10-CM

## 2023-06-12 DIAGNOSIS — J329 Chronic sinusitis, unspecified: Secondary | ICD-10-CM

## 2023-06-12 MED ORDER — FLUTICASONE PROPIONATE 50 MCG/ACT NA SUSP: 2.0000 | Freq: Every day | NASAL | 6 refills | Status: DC

## 2023-06-12 MED ORDER — DOXYCYCLINE HYCLATE 100 MG PO TABS: 100.0000 mg | ORAL_TABLET | Freq: Two times a day (BID) | ORAL | 0 refills | Status: AC

## 2023-06-12 NOTE — Progress Notes (Signed)
 As needed  Established Patient Office Visit   Subjective:  Patient ID: [name], female    DOB: 1934/12/29  Age: 89 y.o. MRN: [id number]  Chief Complaint  Patient presents with   Headache    Headache with congestion and ear fullness.     Headache  Associated symptoms include coughing and hearing loss. Pertinent negatives include no abdominal pain, blurred vision, eye redness, tingling or weakness.   Encounter Diagnoses  Name Primary?   Sinusitis, unspecified chronicity, unspecified location Yes   Dysfunction of both eustachian tubes    Ongoing URI symptoms just prior to recent hospitalization for headache and hyperkalemia.  CT scan of head did show evidence for chronic sinus disease in her ethmoid and frontal sinuses.  Hyperkalemia was corrected with Lokelma  and patient was discharged on cefadroxil  for culture positive UTI with Proteus.  She is having ongoing headache on the top of her head and in the left frontal area.  She is having ongoing postnasal drip.  Mild cough.  There is ear congestion as well.   Review of Systems  Constitutional: Negative.   HENT:  Positive for congestion, hearing loss and sinus pain.   Eyes:  Negative for blurred vision, discharge and redness.  Respiratory:  Positive for cough. Negative for sputum production, shortness of breath and wheezing.   Cardiovascular: Negative.   Gastrointestinal:  Negative for abdominal pain.  Genitourinary: Negative.   Musculoskeletal: Negative.  Negative for myalgias.  Skin:  Negative for rash.  Neurological:  Positive for headaches. Negative for tingling, loss of consciousness and weakness.  Endo/Heme/Allergies:  Negative for polydipsia.     Current Outpatient Medications:    albuterol  (PROAIR  HFA) 108 (90 Base) MCG/ACT inhaler, Inhale 1-2 puffs into the lungs every 6 (six) hours as needed for wheezing or shortness of breath., Disp: 6.7 g, Rfl: 1   atorvastatin  (LIPITOR ) 80 MG tablet, TAKE 1 TABLET BY MOUTH EVERY  DAY (Patient taking differently: Take 80 mg by mouth at bedtime.), Disp: 90 tablet, Rfl: 3   azelastine  (ASTELIN ) 0.1 % nasal spray, Place 2 sprays into both nostrils 2 (two) times daily., Disp: 30 mL, Rfl: 12   butalbital -acetaminophen -caffeine  (FIORICET ) 50-325-40 MG tablet, Take 1 tablet by mouth every 6 (six) hours as needed for headache., Disp: 20 tablet, Rfl: 0   carvedilol  (COREG ) 12.5 MG tablet, TAKE 1 TABLET BY MOUTH 2 TIMES DAILY, Disp: 180 tablet, Rfl: 1   cetirizine  (ZYRTEC ) 10 MG tablet, Take 10 mg by mouth daily as needed for allergies or rhinitis., Disp: , Rfl:    diclofenac  Sodium (VOLTAREN ) 1 % GEL, Apply 2 g topically 4 (four) times daily. (Patient taking differently: Apply 2 g topically 4 (four) times daily as needed (pain).), Disp: 150 g, Rfl: 2   docusate sodium  (COLACE) 100 MG capsule, Take 2 capsules (200 mg total) by mouth daily., Disp: 180 capsule, Rfl: 3   doxycycline  (VIBRA -TABS) 100 MG tablet, Take 1 tablet (100 mg total) by mouth 2 (two) times daily for 10 days., Disp: 20 tablet, Rfl: 0   ELIQUIS  5 MG TABS tablet, TAKE 1 TABLET BY MOUTH 2 TIMES DAILY, Disp: 180 tablet, Rfl: 3   febuxostat  (ULORIC ) 40 MG tablet, TAKE 1 TABLET BY MOUTH EVERY DAY, Disp: 90 tablet, Rfl: 3   fluticasone  (FLONASE ) 50 MCG/ACT nasal spray, Place 2 sprays into both nostrils daily., Disp: 16 g, Rfl: 6   Fluticasone -Umeclidin-Vilant (TRELEGY ELLIPTA ) 100-62.5-25 MCG/ACT AEPB, Inhale 1 puff into the lungs daily., Disp: 1 each, Rfl:  11   folic acid  (FOLVITE ) 1 MG tablet, TAKE 1 TABLET BY MOUTH EVERY DAY, Disp: 90 tablet, Rfl: 1   glucagon  1 MG injection, Inject 1 mg into the vein once as needed for up to 1 dose., Disp: 1 each, Rfl: 12   glucose 4 GM chewable tablet, Chew 1 tablet (4 g total) by mouth as needed for low blood sugar., Disp: 50 tablet, Rfl: 12   hydrOXYzine  (ATARAX ) 25 MG tablet, TAKE 1 TABLET BY MOUTH EVERY 8 HOURS AS NEEDED FOR ANXIETY OR ITCHING (NASAL AND THROAT CONGESTION), Disp: 120  tablet, Rfl: 0   insulin  glargine, 2 Unit Dial , (TOUJEO  MAX SOLOSTAR) 300 UNIT/ML Solostar Pen, Inject 32 Units into the skin daily before breakfast., Disp: 12 mL, Rfl: 0   linaclotide  (LINZESS ) 145 MCG CAPS capsule, Patient take 1 capsule daily.  May add an additional capsule for breakthrough constipation associated with opioid use (Patient taking differently: Take 145 mcg by mouth every other day.), Disp: 180 capsule, Rfl: 1   meclizine  (ANTIVERT ) 25 MG tablet, TAKE 1 TO 2 TABLETS BY MOUTH TWICE DAILY AS NEEDED FOR DIZZINESS, Disp: 90 tablet, Rfl: 0   melatonin 5 MG TABS, Take 1 tablet (5 mg total) by mouth at bedtime as needed., Disp: 15 tablet, Rfl: 0   memantine  (NAMENDA ) 5 MG tablet, TAKE 2 TABLETS BY MOUTH EVERY MORNING and TAKE 1 TABLET BY MOUTH EVERY EVENING (Patient taking differently: Take 5-10 mg by mouth See admin instructions. Take 10 mg by mouth in the morning and 5 mg in the evening), Disp: 270 tablet, Rfl: 5   montelukast  (SINGULAIR ) 10 MG tablet, Take 1 tablet (10 mg total) by mouth at bedtime., Disp: 30 tablet, Rfl: 3   nitroGLYCERIN  (NITROSTAT ) 0.4 MG SL tablet, Take one tablet under the tongue every 5 minutes as needed for chest pain (Patient taking differently: Place 0.4 mg under the tongue every 5 (five) minutes as needed for chest pain.), Disp: 50 tablet, Rfl: 0   NOVOLOG  FLEXPEN 100 UNIT/ML FlexPen, INJECT 14 UNITS SUBCUTANEOUSLY TWICE DAILY WITH A MEAL . DO NOT EXCEED 70 PER 24 HOURS, Disp: 15 mL, Rfl: 0   nystatin  (MYCOSTATIN /NYSTOP ) powder, Apply 1 Application topically 3 (three) times daily., Disp: 60 g, Rfl: 3   ondansetron  (ZOFRAN ) 4 MG tablet, Take 1 tablet (4 mg total) by mouth every 8 (eight) hours as needed for nausea or vomiting., Disp: 20 tablet, Rfl: 0   pantoprazole  (PROTONIX ) 20 MG tablet, TAKE 1 TABLET BY MOUTH EVERY DAY, Disp: 30 tablet, Rfl: 5   Semaglutide ,0.25 or 0.5MG /DOS, (OZEMPIC , 0.25 OR 0.5 MG/DOSE,) 2 MG/3ML SOPN, Inject 0.5 mg into the skin once a week.  (Patient taking differently: Inject 0.5 mg into the skin every Monday.), Disp: 3 mL, Rfl: 11   torsemide  (DEMADEX ) 20 MG tablet, TAKE 1 TABLET BY MOUTH ONCE DAILY - MAY TAKE AN EXTRA DOES FOR WEIGHT GAIN OF 3LBs IN 1 DAY OR 5LBS IN 1 WEEK OR FOR WORSENING SWELLING, Disp: 30 tablet, Rfl: 0   TYLENOL  500 MG tablet, Take 1,000 mg by mouth every 6 (six) hours as needed for mild pain or headache., Disp: , Rfl:    TYLENOL  PM EXTRA STRENGTH 500-25 MG TABS tablet, Take 2 tablets by mouth at bedtime., Disp: , Rfl:    Vitamin D , Ergocalciferol , (DRISDOL ) 1.25 MG (50000 UNIT) CAPS capsule, TAKE 1 CAPSULE BY MOUTH ONCE WEEKLY, Disp: 4 capsule, Rfl: 1   amitriptyline  (ELAVIL ) 75 MG tablet, Take 1 tablet (75 mg  total) by mouth at bedtime., Disp: 90 tablet, Rfl: 0   cefadroxil  (DURICEF) 500 MG capsule, Take 1 capsule (500 mg total) by mouth daily for 5 days., Disp: 5 capsule, Rfl: 0   ipratropium-albuterol  (DUONEB) 0.5-2.5 (3) MG/3ML SOLN, Take 3 mLs by nebulization every 6 (six) hours as needed (cough, wheezing, or shortness of breath)., Disp: 360 mL, Rfl: 0   Objective:     BP 128/82   Pulse 69   Temp (!) 97.5 F (36.4 C)   Ht 5' 7 (1.702 m)   SpO2 98%   BMI 23.17 kg/m    Physical Exam Constitutional:      General: She is not in acute distress.    Appearance: Normal appearance. She is not ill-appearing, toxic-appearing or diaphoretic.  HENT:     Head: Normocephalic and atraumatic.     Right Ear: External ear normal. No middle ear effusion. Tympanic membrane is retracted. Tympanic membrane is not erythematous.     Left Ear: External ear normal.  No middle ear effusion. Tympanic membrane is retracted. Tympanic membrane is not erythematous.     Mouth/Throat:     Mouth: Mucous membranes are moist.     Pharynx: Oropharynx is clear. No oropharyngeal exudate or posterior oropharyngeal erythema.  Eyes:     General: No scleral icterus.       Right eye: No discharge.        Left eye: No discharge.      Extraocular Movements: Extraocular movements intact.     Conjunctiva/sclera: Conjunctivae normal.     Pupils: Pupils are equal, round, and reactive to light.  Cardiovascular:     Rate and Rhythm: Normal rate and regular rhythm.  Pulmonary:     Effort: Pulmonary effort is normal. No respiratory distress.     Breath sounds: Normal breath sounds.  Abdominal:     General: Bowel sounds are normal.  Musculoskeletal:     Cervical back: No rigidity or tenderness.  Skin:    General: Skin is warm and dry.  Neurological:     Mental Status: She is alert and oriented to person, place, and time.  Psychiatric:        Mood and Affect: Mood normal.        Behavior: Behavior normal.      No results found for any visits on 06/12/23.    The ASCVD Risk score (Arnett DK, et al., 2019) failed to calculate for the following reasons:   The 2019 ASCVD risk score is only valid for ages 47 to 8   Risk score cannot be calculated because patient has a medical history suggesting prior/existing ASCVD    Assessment & Plan:   Sinusitis, unspecified chronicity, unspecified location -     Fluticasone  Propionate; Place 2 sprays into both nostrils daily.  Dispense: 16 g; Refill: 6 -     Doxycycline  Hyclate; Take 1 tablet (100 mg total) by mouth 2 (two) times daily for 10 days.  Dispense: 20 tablet; Refill: 0  Dysfunction of both eustachian tubes -     Fluticasone  Propionate; Place 2 sprays into both nostrils daily.  Dispense: 16 g; Refill: 6 -     Doxycycline  Hyclate; Take 1 tablet (100 mg total) by mouth 2 (two) times daily for 10 days.  Dispense: 20 tablet; Refill: 0    Return Schedule follow-up with Dr. Sebastian..  Doxycycline  100 twice daily for 10 days.  Advised patient to fill Flonase  and use it daily until follow-up with Dr. Sebastian.  Suspect that chronic sinus disease could be the cause of her headaches.  Elsie Sim Lent, MD

## 2023-06-13 ENCOUNTER — Ambulatory Visit (INDEPENDENT_AMBULATORY_CARE_PROVIDER_SITE_OTHER): Payer: PPO | Admitting: Podiatry

## 2023-06-13 ENCOUNTER — Encounter: Payer: Self-pay | Admitting: Podiatry

## 2023-06-13 DIAGNOSIS — B351 Tinea unguium: Secondary | ICD-10-CM

## 2023-06-13 DIAGNOSIS — M79609 Pain in unspecified limb: Secondary | ICD-10-CM | POA: Diagnosis not present

## 2023-06-13 DIAGNOSIS — M21969 Unspecified acquired deformity of unspecified lower leg: Secondary | ICD-10-CM | POA: Diagnosis not present

## 2023-06-13 DIAGNOSIS — E1169 Type 2 diabetes mellitus with other specified complication: Secondary | ICD-10-CM | POA: Diagnosis not present

## 2023-06-13 NOTE — Progress Notes (Signed)
 This patient returns to my office for at risk foot care.  This patient requires this care by a professional since this patient will be at risk due to having diabetes and CKD. She presents to the office  in a wheelchair with a female caregiver. This patient is unable to cut nails herself since the patient cannot reach her nails.These nails are painful walking and wearing shoes.  This patient presents for at risk foot care today.  General Appearance  Alert, conversant and in no acute stress.  Vascular  Dorsalis pedis and posterior tibial  pulses are  weakly palpable  bilaterally.  Capillary return is within normal limits  bilaterally. Temperature is within normal limits  bilaterally.  Neurologic  Senn-Weinstein monofilament wire test within normal limits  bilaterally. Muscle power within normal limits bilaterally.  Nails Thick disfigured discolored nails with subungual debris  from hallux to fifth toes bilaterally. No evidence of bacterial infection or drainage bilaterally.  Orthopedic  No limitations of motion  feet .  No crepitus or effusions noted.  No bony pathology or digital deformities noted. Deviation of toes 2-5 right foot at the MPJ.  Skin  normotropic skin with no porokeratosis noted bilaterally.  No signs of infections or ulcers noted.     Onychomycosis  Pain in right toes  Pain in left toes  Consent was obtained for treatment procedures.   Mechanical debridement of nails 1-5  bilaterally performed with a nail nipper.  Filed with dremel without incident.    Return office visit  12  weeks                  Told patient to return for periodic foot care and evaluation due to potential at risk complications.    Cordella Bold DPM

## 2023-06-14 DIAGNOSIS — N184 Chronic kidney disease, stage 4 (severe): Secondary | ICD-10-CM | POA: Diagnosis not present

## 2023-06-14 DIAGNOSIS — M48062 Spinal stenosis, lumbar region with neurogenic claudication: Secondary | ICD-10-CM | POA: Diagnosis not present

## 2023-06-14 DIAGNOSIS — E1169 Type 2 diabetes mellitus with other specified complication: Secondary | ICD-10-CM | POA: Diagnosis not present

## 2023-06-14 DIAGNOSIS — G4700 Insomnia, unspecified: Secondary | ICD-10-CM | POA: Diagnosis not present

## 2023-06-14 DIAGNOSIS — F0393 Unspecified dementia, unspecified severity, with mood disturbance: Secondary | ICD-10-CM | POA: Diagnosis not present

## 2023-06-14 DIAGNOSIS — F332 Major depressive disorder, recurrent severe without psychotic features: Secondary | ICD-10-CM | POA: Diagnosis not present

## 2023-06-14 DIAGNOSIS — F03918 Unspecified dementia, unspecified severity, with other behavioral disturbance: Secondary | ICD-10-CM | POA: Diagnosis not present

## 2023-06-14 DIAGNOSIS — F0394 Unspecified dementia, unspecified severity, with anxiety: Secondary | ICD-10-CM | POA: Diagnosis not present

## 2023-06-14 DIAGNOSIS — E1142 Type 2 diabetes mellitus with diabetic polyneuropathy: Secondary | ICD-10-CM | POA: Diagnosis not present

## 2023-06-14 DIAGNOSIS — E113393 Type 2 diabetes mellitus with moderate nonproliferative diabetic retinopathy without macular edema, bilateral: Secondary | ICD-10-CM | POA: Diagnosis not present

## 2023-06-14 DIAGNOSIS — M4726 Other spondylosis with radiculopathy, lumbar region: Secondary | ICD-10-CM | POA: Diagnosis not present

## 2023-06-14 DIAGNOSIS — E1122 Type 2 diabetes mellitus with diabetic chronic kidney disease: Secondary | ICD-10-CM | POA: Diagnosis not present

## 2023-06-14 DIAGNOSIS — I2510 Atherosclerotic heart disease of native coronary artery without angina pectoris: Secondary | ICD-10-CM | POA: Diagnosis not present

## 2023-06-14 DIAGNOSIS — E875 Hyperkalemia: Secondary | ICD-10-CM | POA: Diagnosis not present

## 2023-06-14 DIAGNOSIS — N179 Acute kidney failure, unspecified: Secondary | ICD-10-CM | POA: Diagnosis not present

## 2023-06-14 DIAGNOSIS — N390 Urinary tract infection, site not specified: Secondary | ICD-10-CM | POA: Diagnosis not present

## 2023-06-14 DIAGNOSIS — M170 Bilateral primary osteoarthritis of knee: Secondary | ICD-10-CM | POA: Diagnosis not present

## 2023-06-14 DIAGNOSIS — I130 Hypertensive heart and chronic kidney disease with heart failure and stage 1 through stage 4 chronic kidney disease, or unspecified chronic kidney disease: Secondary | ICD-10-CM | POA: Diagnosis not present

## 2023-06-14 DIAGNOSIS — B964 Proteus (mirabilis) (morganii) as the cause of diseases classified elsewhere: Secondary | ICD-10-CM | POA: Diagnosis not present

## 2023-06-14 DIAGNOSIS — F411 Generalized anxiety disorder: Secondary | ICD-10-CM | POA: Diagnosis not present

## 2023-06-14 DIAGNOSIS — D631 Anemia in chronic kidney disease: Secondary | ICD-10-CM | POA: Diagnosis not present

## 2023-06-14 DIAGNOSIS — I5032 Chronic diastolic (congestive) heart failure: Secondary | ICD-10-CM | POA: Diagnosis not present

## 2023-06-14 DIAGNOSIS — E785 Hyperlipidemia, unspecified: Secondary | ICD-10-CM | POA: Diagnosis not present

## 2023-06-14 DIAGNOSIS — E1165 Type 2 diabetes mellitus with hyperglycemia: Secondary | ICD-10-CM | POA: Diagnosis not present

## 2023-06-15 ENCOUNTER — Other Ambulatory Visit: Payer: Self-pay | Admitting: Hematology

## 2023-06-15 ENCOUNTER — Other Ambulatory Visit: Payer: Self-pay | Admitting: Family Medicine

## 2023-06-15 DIAGNOSIS — I5032 Chronic diastolic (congestive) heart failure: Secondary | ICD-10-CM

## 2023-06-16 ENCOUNTER — Encounter (HOSPITAL_COMMUNITY): Payer: Self-pay

## 2023-06-16 ENCOUNTER — Encounter: Payer: Self-pay | Admitting: Hematology

## 2023-06-18 ENCOUNTER — Ambulatory Visit: Payer: Self-pay

## 2023-06-18 NOTE — Patient Outreach (Signed)
  Care Coordination   Follow Up Visit Note   06/18/2023 Name: [name] MRN: [id number] DOB: 1934-11-16  [name] is a 89 y.o. year old female who sees Nichole Cluck, MD for primary care. I  spoke with daughter [name].    What matters to the patients health and wellness today?  Maintaining health    Goals Addressed             This Visit's Progress    Diabetes Management       Patient Goals/Self Care Activities: -Patient/Caregiver will take medications as prescribed   -Patient/Caregiver will attend all scheduled provider appointments -Patient/Caregiver will call provider office for new concerns or questions   -Calls provider office for new concerns, questions, or BP outside discussed parameters -Follows a low sodium diet/DASH diet -check blood sugar at prescribed times -check blood sugar if I feel it is too high or too low -record values and write them down take them to all doctor visits    Spoke with daughter [name]. She reports patient okay since hospitalization.  Patient has follow up this week with Nephrology.  Patient needs assist with daily care which daughter provides.  Another daughter helps with appointments for patient.  Discussed [employer] for possible break for caregiver.  She states she has utilized them in the past and was very happy with it.  Patient blood sugar dropped last night as patient appetite has not been as good.  It dropped to 68 but patient is better.  She did not give follow up blood sugar number.  Discussed appetite and insulin . Patient to see endocrinologist Thursday.  Reiterated diabetes management.  No concerns.            SDOH assessments and interventions completed:  Yes     Care Coordination Interventions:  Yes, provided   Follow up plan:  07/05/23    Encounter Outcome:  Patient Visit Completed   Nichole Bones RN, MSN [place]  [facility], [facility] Health RN Care Manager Direct Dial : [phone]  Fax:  [phone] Website: [email or web address]

## 2023-06-18 NOTE — Patient Instructions (Signed)
 Visit Information  Thank you for taking time to visit with me today. Please don't hesitate to contact me if I can be of assistance to you.   Following are the goals we discussed today:   Goals Addressed             This Visit's Progress    Diabetes Management       Patient Goals/Self Care Activities: -Patient/Caregiver will take medications as prescribed   -Patient/Caregiver will attend all scheduled provider appointments -Patient/Caregiver will call provider office for new concerns or questions   -Calls provider office for new concerns, questions, or BP outside discussed parameters -Follows a low sodium diet/DASH diet -check blood sugar at prescribed times -check blood sugar if I feel it is too high or too low -record values and write them down take them to all doctor visits    Spoke with daughter Oralee Billow. She reports patient okay since hospitalization.  Patient has follow up this week with Nephrology.  Patient needs assist with daily care which daughter provides.  Another daughter helps with appointments for patient.  Discussed [employer] for possible break for caregiver.  She states she has utilized them in the past and was very happy with it.  Patient blood sugar dropped last night as patient appetite has not been as good.  It dropped to 68 but patient is better.  She did not give follow up blood sugar number.  Discussed appetite and insulin . Patient to see endocrinologist Thursday.  Reiterated diabetes management.  No concerns.            Our next appointment is by telephone on 07/05/23 at 1030 am  Please call the care guide team at [phone] if you need to cancel or reschedule your appointment.   If you are experiencing a Mental Health or Behavioral Health Crisis or need someone to talk to, please call the Suicide and Crisis Lifeline: 988   Patient verbalizes understanding of instructions and care plan provided today and agrees to view in MyChart. Active MyChart status and  patient understanding of how to access instructions and care plan via MyChart confirmed with patient.     The patient has been provided with contact information for the care management team and has been advised to call with any health related questions or concerns.   Deanette Tullius J. Jamile Sivils RN, MSN [facility], [facility] Health RN Care Manager Direct Dial : [phone]  Fax: [phone] Website: [email or web address]

## 2023-06-19 ENCOUNTER — Encounter: Payer: Self-pay | Admitting: Family Medicine

## 2023-06-19 ENCOUNTER — Telehealth: Payer: Self-pay | Admitting: Family Medicine

## 2023-06-19 ENCOUNTER — Telehealth: Payer: Self-pay

## 2023-06-19 ENCOUNTER — Ambulatory Visit: Payer: PPO | Admitting: Family Medicine

## 2023-06-19 VITALS — BP 110/72 | HR 70 | Temp 97.1°F | Wt 264.0 lb

## 2023-06-19 DIAGNOSIS — I5032 Chronic diastolic (congestive) heart failure: Secondary | ICD-10-CM | POA: Diagnosis not present

## 2023-06-19 DIAGNOSIS — N390 Urinary tract infection, site not specified: Secondary | ICD-10-CM | POA: Diagnosis not present

## 2023-06-19 DIAGNOSIS — Z794 Long term (current) use of insulin: Secondary | ICD-10-CM

## 2023-06-19 DIAGNOSIS — I10 Essential (primary) hypertension: Secondary | ICD-10-CM

## 2023-06-19 DIAGNOSIS — G4709 Other insomnia: Secondary | ICD-10-CM

## 2023-06-19 DIAGNOSIS — E1122 Type 2 diabetes mellitus with diabetic chronic kidney disease: Secondary | ICD-10-CM | POA: Diagnosis not present

## 2023-06-19 DIAGNOSIS — J322 Chronic ethmoidal sinusitis: Secondary | ICD-10-CM

## 2023-06-19 DIAGNOSIS — E1165 Type 2 diabetes mellitus with hyperglycemia: Secondary | ICD-10-CM | POA: Diagnosis not present

## 2023-06-19 DIAGNOSIS — N184 Chronic kidney disease, stage 4 (severe): Secondary | ICD-10-CM | POA: Diagnosis not present

## 2023-06-19 DIAGNOSIS — M170 Bilateral primary osteoarthritis of knee: Secondary | ICD-10-CM | POA: Diagnosis not present

## 2023-06-19 DIAGNOSIS — E875 Hyperkalemia: Secondary | ICD-10-CM | POA: Diagnosis not present

## 2023-06-19 DIAGNOSIS — B964 Proteus (mirabilis) (morganii) as the cause of diseases classified elsewhere: Secondary | ICD-10-CM | POA: Diagnosis not present

## 2023-06-19 DIAGNOSIS — F411 Generalized anxiety disorder: Secondary | ICD-10-CM | POA: Diagnosis not present

## 2023-06-19 DIAGNOSIS — E1140 Type 2 diabetes mellitus with diabetic neuropathy, unspecified: Secondary | ICD-10-CM

## 2023-06-19 DIAGNOSIS — Z09 Encounter for follow-up examination after completed treatment for conditions other than malignant neoplasm: Secondary | ICD-10-CM

## 2023-06-19 DIAGNOSIS — E1142 Type 2 diabetes mellitus with diabetic polyneuropathy: Secondary | ICD-10-CM | POA: Diagnosis not present

## 2023-06-19 DIAGNOSIS — I130 Hypertensive heart and chronic kidney disease with heart failure and stage 1 through stage 4 chronic kidney disease, or unspecified chronic kidney disease: Secondary | ICD-10-CM | POA: Diagnosis not present

## 2023-06-19 DIAGNOSIS — N179 Acute kidney failure, unspecified: Secondary | ICD-10-CM | POA: Diagnosis not present

## 2023-06-19 DIAGNOSIS — D631 Anemia in chronic kidney disease: Secondary | ICD-10-CM | POA: Diagnosis not present

## 2023-06-19 MED ORDER — AMITRIPTYLINE HCL 75 MG PO TABS: 75.0000 mg | ORAL_TABLET | Freq: Every day | ORAL | 0 refills | Status: DC

## 2023-06-19 NOTE — Patient Instructions (Addendum)
Today we saw you for hospitalization follow-up for high potassium levels and sketcher ongoing health concerns including chronic kidney disease, chronic sinusitis, dizziness, diabetic neuropathy, and insomnia  For hyperkalemia which is high potassium levels.  Please follow-up with nephrology tomorrow to reevaluate chest months kidney function   For chronic sinusitis, you have been treated with multiple antibiotics and sprays, but still experience dizziness and headaches.  Recommend seeing your ENT was [facility] for further evaluation and management.  Continue using the nasal sprays and consider over-the-counter sinus medications as needed.  Monitor your symptoms and let us know if they persist or worsen  Diabetic neuropathy is what causes the burning and ear feet.  Resuming amitriptyline should help manage the symptoms as well as improve mood and sleep.  Please monitor your symptoms over the next month.   For blood pressure, your levels are normal today.  Please monitor blood pressure at home.  Normal level should be no less than 90/60 or new greater than 140/90.

## 2023-06-19 NOTE — Telephone Encounter (Signed)
Patient dropped off document Home Health Certificate (Order ID  ), to be filled out by provider. Patient requested to send it back via Fax within 7-days. Document is located in providers tray at front office.Please advise at Mobile [phone] (mobile)   Home health cert by fax. I pt in the dr box

## 2023-06-19 NOTE — Telephone Encounter (Signed)
A user error has taken place: encounter opened in error, closed for administrative reasons.

## 2023-06-19 NOTE — Progress Notes (Signed)
Assessment/Plan:      Hyperkalemia Hospitalized for hyperkalemia likely due to dietary intake, particularly significant fruit juice consumption. Initial potassium level was 5.8, peaked at 6.8, and decreased to 4.8 on discharge. Received IV furosemide, Lokelma, and insulin during hospitalization. Currently, potassium levels are stable, and she is feeling better. Discussed the difficulty of managing potassium levels with her level of kidney dysfunction and the potential impact of dietary potassium. - Follow up with nephrology tomorrow for re-evaluation of potassium levels and kidney function  Chronic Kidney Disease Stage 4 (CKD4) CKD4 with a GFR of 24 on discharge. Discussed the importance of monitoring kidney function and electrolytes during nephrology visit. - Continue follow-up with nephrology - Monitor kidney function and electrolytes during nephrology visit  Chronic Sinusitis Chronic sinusitis treated with multiple antibiotics and nasal sprays. Recently prescribed doxycycline 100 mg BID for 10 days and fluticasone propionate nasal spray. Reports dizziness and headache, which may be related to sinusitis. Discussed the potential need for ENT evaluation and possible procedures to manage chronic sinusitis and associated dizziness. - Refer to ENT for further evaluation and management of chronic sinusitis and associated dizziness - Continue fluticasone propionate nasal spray - Consider over-the-counter sinus medications like Tylenol Sinus and quercetin as needed  Dizziness Ongoing dizziness, which may be related to chronic sinusitis. No acute findings on recent CT head. Dizziness has improved since hospitalization. Discussed the potential link between chronic sinusitis and dizziness and the need for ENT evaluation. - Refer to ENT for evaluation of chronic sinusitis and potential vertigo management - Monitor symptoms and reassess if dizziness persists or worsens  Diabetic Neuropathy Reports  burning sensation in legs, previously managed with amitriptyline. Symptoms have worsened since stopping the medication. Discussed the benefits of resuming amitriptyline for neuropathy, mood, and sleep, and the expected timeline for symptom improvement. - Resume amitriptyline 75 mg for neuropathy, mood, and sleep - Monitor for improvement in neuropathy symptoms over the next month  Insomnia Reports difficulty sleeping, previously managed with amitriptyline 75 mg. Symptoms have worsened since stopping the medication. Discussed the benefits of resuming amitriptyline for sleep and the expected timeline for improvement. - Resume amitriptyline 75 mg for sleep - Monitor sleep patterns and reassess in three months  General Health Maintenance Discussed blood pressure ranges and intermittent fasting for weight management. - Provide written information on high and low blood pressure ranges - Encourage continuation of intermittent fasting if it is effective and sustainable for weight management  Follow-up - Follow up with nephrology tomorrow - Refer to ENT for  chronic sinusitis and dizziness - Schedule follow-up visit in three months to reassess sleep, mood, and neuropathy.        There are no discontinued medications.  No follow-ups on file.    Subjective:   Encounter date: 06/19/2023  [name] is a 89 y.o. female who has Hyperlipidemia; Class 3 obesity; Symptomatic anemia; TENSION HEADACHE; Essential hypertension; History of DVT (deep vein thrombosis); Chronic diastolic CHF (congestive heart failure) (HCC); Long term current use of anticoagulant therapy; Insomnia; Estrogen deficiency; COPD with chronic bronchitis (HCC); Drug-induced constipation; Vertigo; Gout; Acute kidney injury superimposed on chronic kidney disease (HCC); Vitamin D deficiency; Endometrial polyp; GERD with esophagitis; Dementia (HCC); Type 2 diabetes mellitus with hyperlipidemia (HCC); Moderate nonproliferative  diabetic retinopathy of both eyes without macular edema associated with type 2 diabetes mellitus (HCC); Chronic radicular lumbar pain; Lumbar spondylosis; Spinal stenosis, lumbar region, with neurogenic claudication; Bilateral primary osteoarthritis of knee; Iron deficiency anemia; Chronic pain of  right hand; Breast cancer (HCC); Severe episode of recurrent major depressive disorder, without psychotic features (HCC); Pneumobilia; Dermatosis papulosa nigra; CKD (chronic kidney disease), stage IV (HCC); Type 2 diabetes mellitus with stage 4 chronic kidney disease, with long-term current use of insulin (HCC); Type 2 diabetes mellitus with diabetic neuropathy, with long-term current use of insulin (HCC); COPD with acute exacerbation (HCC); GAD (generalized anxiety disorder); Allergic rhinitis; Diabetic peripheral neuropathy associated with type 2 diabetes mellitus (HCC); Swelling of right upper extremity; Carpal tunnel syndrome on both sides; Very limited skin mobility; Chronic left hip pain; Dependent for medication administration; Chills; Upper GI bleed; CAD (coronary artery disease); Epigastric abdominal tenderness; Chronic ethmoidal sinusitis; ABLA (acute blood loss anemia); Recurrent acute suppurative otitis media of right ear without spontaneous rupture of tympanic membrane; Viral URI with cough; Concha bullosa; Tinnitus of both ears; PND (post-nasal drip); Limited mobility; and Hyperkalemia on their problem list..   She  has a past medical history of Abdominal pain (01/26/2012), Acute deep vein thrombosis (DVT) of femoral vein of right lower extremity (HCC) (03/26/2022), Acute kidney injury superimposed on chronic kidney disease (HCC) (12/18/2014), Acute upper GI bleed (03/26/2022), Allergy, Anemia, unspecified, Anxiety, Anxiety associated with depression (06/08/2017), Arthritis, Breast cancer (HCC), Breast cancer screening (02/19/2014), Breast mass (10/27/2014), Chronic diastolic CHF (congestive heart failure)  (HCC), Chronic pain syndrome (02/06/2015), CKD (chronic kidney disease), stage III (HCC), Coronary artery disease, Debility (08/01/2012), Dementia (HCC), Depression, Diabetes mellitus, DOE (dyspnea on exertion) (06/24/2014), Dysphagia, unspecified(787.20) (07/31/2012), Fungal dermatitis (11/13/2007), GERD (gastroesophageal reflux disease), Gout, unspecified, Headache, History of anemia due to chronic kidney disease (03/26/2022), History of blood clots, History of deep venous thrombosis (01/27/2012), History of pulmonary embolus (PE) (04/22/2014), Hyperkalemia (07/26/2017), Hyperlipidemia, Hypertension, Insomnia, Joint pain, Joint swelling, Morbid obesity (HCC), Multiple benign nevi (11/15/2016), Myocardial infarction (HCC) (2004), Non-STEMI (non-ST elevated myocardial infarction) (HCC) (02/15/2020), Numbness, Obstructive sleep apnea, Osteoarthritis, Osteoarthritis, Osteoarthrosis, unspecified whether generalized or localized, lower leg, Pain in the chest (12/31/2013), Pain, chronic, Personal history of other diseases of the digestive system (12/03/2006), Polymyalgia rheumatica (HCC), Presence of stent in right coronary artery, Pulmonary emboli (HCC) (01/2012), Situational depression (06/04/2009), Syncope, vasovagal (07/04/2021), Type 2 diabetes mellitus with hyperlipidemia (HCC) (02/16/2022), Urinary incontinence, Vaginitis and vulvovaginitis (06/23/2016), Vertigo, and Wears dentures..   She presents with chief complaint of Hospitalization Follow-up (Dx: Hyperkalemia. Amitriptyline for sleep. ) .  Elmon Kirschner   [facility]  Discussed the use of AI scribe software for clinical note transcription with the patient, who gave verbal consent to proceed.  History of Present Illness   [name] is an 89 year old female with hyperkalemia and CKD4 who presents for follow-up after hospitalization.  She was recently hospitalized for hyperkalemia, likely related to significant fruit  juice intake. During her hospital stay, she was treated with IV furosemide, Lokelma, and insulin. Her potassium levels were initially 5.8, increased to 6.8, and then decreased to 4.8 upon discharge. She is not currently taking potassium supplements. She follows up with nephrology and is scheduled to see her nephrologist tomorrow.  She has a history of CKD4 and her GFR was 24 on discharge. She is under the care of a nephrologist and continues to take torsemide, which may affect potassium levels.  During her hospitalization, she also had a urinary tract infection that grew Proteus mirabilis and was discharged on cefadroxil. She feels better since discharge.  She experiences dizziness, which is not as severe as before. She had a fall during her hospital stay, hitting  her head, but a CT scan showed no acute findings. She continues to feel some head discomfort and dizziness.  She has a history of chronic sinusitis and has been treated with doxycycline and fluticasone propionate nasal spray. Tylenol sinus and quercetin help alleviate her headache and dizziness. She has not seen an ENT in about a year but has been seeing an audiologist for hearing aids.  She has concerns about her amitriptyline dosing for sleep, which was previously helping her with sleep, mood, and diabetic nephropathy. She stopped taking it but found it beneficial when she was on it. She also reports burning sensations in her legs at night, which were previously managed with amitriptyline.      Follow up Hospitalization  Patient was [facility] this[facility] on 06/04/2023 and discharged on 06/08/2023. She was treated for hyperkalemia. Telephone follow up was done on 06/11/2023 She reports excellent compliance with treatment. She reports this condition is improved.  ----------------------------------------------------------------------------------------- -       Past Surgical History:  Procedure Laterality  Date   ABDOMINAL HYSTERECTOMY     partial   APPENDECTOMY     blood clots/legs and lungs  2013   BREAST BIOPSY Left 07/22/2014   BREAST BIOPSY Left 02/10/2013   BREAST LUMPECTOMY Left 11/05/2014   BREAST LUMPECTOMY WITH RADIOACTIVE SEED LOCALIZATION Left 11/05/2014   Procedure: LEFT BREAST LUMPECTOMY WITH RADIOACTIVE SEED LOCALIZATION;  Surgeon: Abigail Miyamoto, MD;  Location: [facility] OR;  Service: General;  Laterality: Left;   CARDIAC CATHETERIZATION     COLONOSCOPY     CORONARY ANGIOPLASTY  2   CORONARY STENT INTERVENTION N/A 02/17/2020   Procedure: CORONARY STENT INTERVENTION;  Surgeon: Marykay Lex, MD;  Location: [facility] INVASIVE CV LAB;  Service: Cardiovascular;  Laterality: N/A;   ESOPHAGOGASTRODUODENOSCOPY (EGD) WITH PROPOFOL N/A 11/07/2016   Procedure: ESOPHAGOGASTRODUODENOSCOPY (EGD) WITH PROPOFOL;  Surgeon: Iva Boop, MD;  Location: [facility] ENDOSCOPY;  Service: Endoscopy;  Laterality: N/A;   ESOPHAGOGASTRODUODENOSCOPY (EGD) WITH PROPOFOL N/A 01/05/2023   Procedure: ESOPHAGOGASTRODUODENOSCOPY (EGD) WITH PROPOFOL;  Surgeon: Hilarie Fredrickson, MD;  Location: [facility] ENDOSCOPY;  Service: Gastroenterology;  Laterality: N/A;   EXCISION OF SKIN TAG Right 11/05/2014   Procedure: EXCISION OF RIGHT EYELID SKIN TAG;  Surgeon: Abigail Miyamoto, MD;  Location: [facility] OR;  Service: General;  Laterality: Right;   EYE SURGERY Bilateral    cataract    GASTRIC BYPASS  1977    reversed in [facility], [facility]   LEFT HEART CATH AND CORONARY ANGIOGRAPHY N/A 02/17/2020   Procedure: LEFT HEART CATH AND CORONARY ANGIOGRAPHY;  Surgeon: Marykay Lex, MD;  Location: [facility] INVASIVE CV LAB;  Service: Cardiovascular;  Laterality: N/A;   LEFT HEART CATHETERIZATION WITH CORONARY ANGIOGRAM N/A 06/29/2014   Procedure: LEFT HEART CATHETERIZATION WITH CORONARY ANGIOGRAM;  Surgeon: Lennette Bihari, MD;  Location: [facility] CATH LAB;  Service: Cardiovascular;  Laterality: N/A;   MEMBRANE PEEL Right 10/23/2018   Procedure: MEMBRANE PEEL;   Surgeon: Edmon Crape, MD;  Location: [facility] OR;  Service: Ophthalmology;  Laterality: Right;   MI with stent placement  2004   PARS PLANA VITRECTOMY Right 10/23/2018   Procedure: PARS PLANA VITRECTOMY WITH 25 GAUGE;  Surgeon: Edmon Crape, MD;  Location: [facility] OR;  Service: Ophthalmology;  Laterality: Right;    Outpatient Medications Prior to Visit  Medication Sig Dispense Refill   albuterol (PROAIR HFA) 108 (90 Base) MCG/ACT inhaler Inhale 1-2 puffs into the lungs every 6 (six) hours as needed for  wheezing or shortness of breath. 6.7 g 1   atorvastatin (LIPITOR) 80 MG tablet TAKE 1 TABLET BY MOUTH EVERY DAY (Patient taking differently: Take 80 mg by mouth at bedtime.) 90 tablet 3   azelastine (ASTELIN) 0.1 % nasal spray Place 2 sprays into both nostrils 2 (two) times daily. 30 mL 12   butalbital-acetaminophen-caffeine (FIORICET) 50-325-40 MG tablet Take 1 tablet by mouth every 6 (six) hours as needed for headache. 20 tablet 0   carvedilol (COREG) 12.5 MG tablet TAKE 1 TABLET BY MOUTH 2 TIMES DAILY 180 tablet 1   cetirizine (ZYRTEC) 10 MG tablet Take 10 mg by mouth daily as needed for allergies or rhinitis.     diclofenac Sodium (VOLTAREN) 1 % GEL Apply 2 g topically 4 (four) times daily. (Patient taking differently: Apply 2 g topically 4 (four) times daily as needed (pain).) 150 g 2   docusate sodium (COLACE) 100 MG capsule Take 2 capsules (200 mg total) by mouth daily. 180 capsule 3   doxycycline (VIBRA-TABS) 100 MG tablet Take 1 tablet (100 mg total) by mouth 2 (two) times daily for 10 days. 20 tablet 0   ELIQUIS 5 MG TABS tablet TAKE 1 TABLET BY MOUTH 2 TIMES DAILY 180 tablet 3   febuxostat (ULORIC) 40 MG tablet TAKE 1 TABLET BY MOUTH EVERY DAY 90 tablet 3   fluticasone (FLONASE) 50 MCG/ACT nasal spray Place 2 sprays into both nostrils daily. 16 g 6   Fluticasone-Umeclidin-Vilant (TRELEGY ELLIPTA) 100-62.5-25 MCG/ACT AEPB Inhale 1 puff into the lungs daily. 1 each 11   folic acid (FOLVITE) 1 MG  tablet TAKE 1 TABLET BY MOUTH EVERY DAY 90 tablet 1   glucagon 1 MG injection Inject 1 mg into the vein once as needed for up to 1 dose. 1 each 12   glucose 4 GM chewable tablet Chew 1 tablet (4 g total) by mouth as needed for low blood sugar. 50 tablet 12   hydrOXYzine (ATARAX) 25 MG tablet TAKE 1 TABLET BY MOUTH EVERY 8 HOURS AS NEEDED FOR ANXIETY OR ITCHING (NASAL AND THROAT CONGESTION) 120 tablet 0   insulin glargine, 2 Unit Dial, (TOUJEO MAX SOLOSTAR) 300 UNIT/ML Solostar Pen Inject 32 Units into the skin daily before breakfast. 12 mL 0   linaclotide (LINZESS) 145 MCG CAPS capsule Patient take 1 capsule daily.  May add an additional capsule for breakthrough constipation associated with opioid use (Patient taking differently: Take 145 mcg by mouth every other day.) 180 capsule 1   meclizine (ANTIVERT) 25 MG tablet TAKE 1 TO 2 TABLETS BY MOUTH TWICE DAILY AS NEEDED FOR DIZZINESS 90 tablet 0   melatonin 5 MG TABS Take 1 tablet (5 mg total) by mouth at bedtime as needed. 15 tablet 0   memantine (NAMENDA) 5 MG tablet TAKE 2 TABLETS BY MOUTH EVERY MORNING and TAKE 1 TABLET BY MOUTH EVERY EVENING (Patient taking differently: Take 5-10 mg by mouth See admin instructions. Take 10 mg by mouth in the morning and 5 mg in the evening) 270 tablet 5   montelukast (SINGULAIR) 10 MG tablet Take 1 tablet (10 mg total) by mouth at bedtime. 30 tablet 3   nitroGLYCERIN (NITROSTAT) 0.4 MG SL tablet Take one tablet under the tongue every 5 minutes as needed for chest pain (Patient taking differently: Place 0.4 mg under the tongue every 5 (five) minutes as needed for chest pain.) 50 tablet 0   NOVOLOG FLEXPEN 100 UNIT/ML FlexPen INJECT 14 UNITS SUBCUTANEOUSLY TWICE DAILY WITH A MEAL .  DO NOT EXCEED 70 PER 24 HOURS 15 mL 0   nystatin (MYCOSTATIN/NYSTOP) powder Apply 1 Application topically 3 (three) times daily. 60 g 3   ondansetron (ZOFRAN) 4 MG tablet Take 1 tablet (4 mg total) by mouth every 8 (eight) hours as needed  for nausea or vomiting. 20 tablet 0   pantoprazole (PROTONIX) 20 MG tablet TAKE 1 TABLET BY MOUTH EVERY DAY 30 tablet 5   Semaglutide,0.25 or 0.5MG /DOS, (OZEMPIC, 0.25 OR 0.5 MG/DOSE,) 2 MG/3ML SOPN Inject 0.5 mg into the skin once a week. (Patient taking differently: Inject 0.5 mg into the skin every Monday.) 3 mL 11   torsemide (DEMADEX) 20 MG tablet TAKE 1 TABLET BY MOUTH ONCE DAILY MAY TAKE AN EXTRA DOES FOR WEIGHT GAIN OF 3LBs IN 1 DAY OR 5LBS IN 1 WEEK OR FOR WORSENING SWELLING 30 tablet 0   TYLENOL 500 MG tablet Take 1,000 mg by mouth every 6 (six) hours as needed for mild pain or headache.     TYLENOL PM EXTRA STRENGTH 500-25 MG TABS tablet Take 2 tablets by mouth at bedtime.     Vitamin D, Ergocalciferol, (DRISDOL) 1.25 MG (50000 UNIT) CAPS capsule TAKE 1 CAPSULE BY MOUTH ONCE WEEKLY 4 capsule 1   amitriptyline (ELAVIL) 75 MG tablet Take 1 tablet (75 mg total) by mouth at bedtime. 90 tablet 0   ipratropium-albuterol (DUONEB) 0.5-2.5 (3) MG/3ML SOLN Take 3 mLs by nebulization every 6 (six) hours as needed (cough, wheezing, or shortness of breath). 360 mL 0   No facility-administered medications prior to visit.    Family History  Problem Relation Age of Onset   Breast cancer Mother 47   Heart disease Mother    Throat cancer Father    Hypertension Father    Arthritis Father    Diabetes Father    Arthritis Sister    Obesity Sister    Diabetes Sister    Heart disease Cousin    Colon cancer Neg Hx    Stomach cancer Neg Hx    Esophageal cancer Neg Hx     Social History   Socioeconomic History   Marital status: Widowed    Spouse name: Not on file   Number of children: 3   Years of education: Not on file   Highest education level: High school graduate  Occupational History   Occupation: retired  Tobacco Use   Smoking status: Never    Passive exposure: Past   Smokeless tobacco: Never   Tobacco comments:    Both parents smoked, patient was exposed/ "Raised up in smoke, my  whole life."  Vaping Use   Vaping status: Never Used  Substance and Sexual Activity   Alcohol use: No    Alcohol/week: 0.0 standard drinks of alcohol   Drug use: No   Sexual activity: Not on file  Other Topics Concern   Not on file  Social History Narrative   Not on file   Social Drivers of Health   Financial Resource Strain: Low Risk  (06/05/2022)   Overall Financial Resource Strain (CARDIA)    Difficulty of Paying Living Expenses: Not hard at all  Food Insecurity: No Food Insecurity (06/11/2023)   Hunger Vital Sign    Worried About Running Out of Food in the Last Year: Never true    Ran Out of Food in the Last Year: Never true  Transportation Needs: No Transportation Needs (06/11/2023)   PRAPARE - Administrator, Civil Service (Medical): No  Lack of Transportation (Non-Medical): No  Physical Activity: Insufficiently Active (06/05/2022)   Exercise Vital Sign    Days of Exercise per Week: 3 days    Minutes of Exercise per Session: 10 min  Stress: Stress Concern Present (06/05/2022)   [employer] of Occupational Health - Occupational Stress Questionnaire    Feeling of Stress : Rather much  Social Connections: Socially Isolated (06/05/2023)   Social Connection and Isolation Panel [NHANES]    Frequency of Communication with Friends and Family: Once a week    Frequency of Social Gatherings with Friends and Family: More than three times a week    Attends Religious Services: Never    Database administrator or Organizations: No    Attends Banker Meetings: Never    Marital Status: Widowed  Intimate Partner Violence: Not At Risk (06/11/2023)   Humiliation, Afraid, Rape, and Kick questionnaire    Fear of Current or Ex-Partner: No    Emotionally Abused: No    Physically Abused: No    Sexually Abused: No                                                                                                  Objective:  Physical Exam: BP 110/72   Pulse 70    Temp (!) 97.1 F (36.2 C) (Temporal)   Wt 264 lb (119.7 kg)   SpO2 97%   BMI 41.35 kg/m     Physical Exam Constitutional:      Appearance: She is obese. She is not ill-appearing.  HENT:     Head: Normocephalic and atraumatic.     Right Ear: Hearing, tympanic membrane and ear canal normal. No middle ear effusion.     Left Ear: Hearing, tympanic membrane and ear canal normal.     Nose: Nose normal.  Eyes:     General: No scleral icterus.       Right eye: No discharge.        Left eye: No discharge.     Extraocular Movements: Extraocular movements intact.  Cardiovascular:     Rate and Rhythm: Normal rate and regular rhythm.     Heart sounds: Normal heart sounds.     Comments: No cyanosis, no JVD Pulmonary:     Effort: Pulmonary effort is normal.     Breath sounds: Normal breath sounds. No decreased air movement.     Comments: No auditory wheezing Abdominal:     General: Abdomen is flat. There is no distension.     Palpations: Abdomen is soft.     Tenderness: There is no abdominal tenderness.  Musculoskeletal:     Right lower leg: Edema present.     Left lower leg: Edema present.  Skin:    General: Skin is warm.     Findings: No rash.  Neurological:     General: No focal deficit present.     Mental Status: She is alert.     Cranial Nerves: No cranial nerve deficit.     Motor: Weakness (Wheelchair-bound) present.  Psychiatric:        Attention and  Perception: She is attentive.        Judgment: Judgment is not impulsive.     CT ABDOMEN PELVIS WO CONTRAST Result Date: 06/04/2023 CLINICAL DATA:  Acute abdominal pain EXAM: CT ABDOMEN AND PELVIS WITHOUT CONTRAST TECHNIQUE: Multidetector CT imaging of the abdomen and pelvis was performed following the standard protocol without IV contrast. RADIATION DOSE REDUCTION: This exam was performed according to the departmental dose-optimization program which includes automated exposure control, adjustment of the mA and/or kV according  to patient size and/or use of iterative reconstruction technique. COMPARISON:  CT abdomen and pelvis 12/26/2022 FINDINGS: Lower chest: No acute abnormality. Hepatobiliary: Gallstones are present. There is no biliary ductal dilatation. The liver is within normal limits. Pancreas: Unremarkable. No pancreatic ductal dilatation or surrounding inflammatory changes. Spleen: Normal in size without focal abnormality. Adrenals/Urinary Tract: Bilateral renal cysts are present measuring up to 3.5 cm in the right kidney and 2.3 cm in the left kidney. There is no hydronephrosis or urinary tract calculus. The adrenal glands and bladder are within normal limits. Stomach/Bowel: No evidence of bowel wall thickening, distention, or inflammatory changes. Small bowel anastomosis is noted in the left abdomen. The appendix is not visualized. There surgical changes of the gastroesophageal junction. There is a small hiatal hernia. Vascular/Lymphatic: Aortic atherosclerosis. No enlarged abdominal or pelvic lymph nodes. Reproductive: The uterus is mildly enlarged and heterogeneous with punctate calcifications likely related to fibroid change. The adnexa are within normal limits. Other: No abdominal wall hernia or abnormality. No abdominopelvic ascites. There sutures along the anterior abdominal wall. Musculoskeletal: There severe degenerative changes of the spine. IMPRESSION: 1. No acute localizing process in the abdomen or pelvis. 2. Cholelithiasis. 3. Small hiatal hernia. 4. Fibroid uterus. 5. Aortic atherosclerosis. Aortic Atherosclerosis (ICD10-I70.0). Electronically Signed   By: Darliss Cheney M.D.   On: 06/04/2023 23:39   CT Cervical Spine Wo Contrast Result Date: 06/04/2023 CLINICAL DATA:  Neck trauma.  Fall 6 days ago with head injury. EXAM: CT CERVICAL SPINE WITHOUT CONTRAST TECHNIQUE: Multidetector CT imaging of the cervical spine was performed without intravenous contrast. Multiplanar CT image reconstructions were also  generated. RADIATION DOSE REDUCTION: This exam was performed according to the departmental dose-optimization program which includes automated exposure control, adjustment of the mA and/or kV according to patient size and/or use of iterative reconstruction technique. COMPARISON:  Cervical spine radiographs 02/02/2020. FINDINGS: Alignment: Normal. Skull base and vertebrae: No evidence of acute fracture or traumatic subluxation. Soft tissues and spinal canal: No prevertebral fluid or swelling. No visible canal hematoma. Disc levels: The cervical disc heights are relatively maintained. There is mild multilevel spondylosis with disc bulging, uncinate spurring and facet hypertrophy. No large disc herniation or high-grade spinal stenosis demonstrated. Upper chest: Clear lung apices. Other: Bilateral carotid atherosclerosis. IMPRESSION: 1. No evidence of acute cervical spine fracture, traumatic subluxation or static signs of instability. 2. Mild multilevel cervical spondylosis. Electronically Signed   By: Carey Bullocks M.D.   On: 06/04/2023 14:49   CT Head Wo Contrast Result Date: 06/04/2023 CLINICAL DATA:  Head trauma, minor (Age >= 65y) EXAM: CT HEAD WITHOUT CONTRAST TECHNIQUE: Contiguous axial images were obtained from the base of the skull through the vertex without intravenous contrast. RADIATION DOSE REDUCTION: This exam was performed according to the departmental dose-optimization program which includes automated exposure control, adjustment of the mA and/or kV according to patient size and/or use of iterative reconstruction technique. COMPARISON:  09/12/2022 FINDINGS: Brain: No evidence of acute infarction, hemorrhage, hydrocephalus, extra-axial collection or mass  lesion/mass effect. Patchy low-density changes within the periventricular and subcortical white matter most compatible with chronic microvascular ischemic change. Mild diffuse cerebral volume loss. Vascular: Atherosclerotic calcifications involving  the large vessels of the skull base. No unexpected hyperdense vessel. Skull: Normal. Negative for fracture or focal lesion. Sinuses/Orbits: Mucosal thickening of the right frontal sinus and within the anterior right ethmoid air cells. Otherwise clear. Other: Negative for scalp hematoma. IMPRESSION: 1. No acute intracranial findings. 2. Chronic microvascular ischemic change and cerebral volume loss. Electronically Signed   By: Duanne Guess D.O.   On: 06/04/2023 14:48    Recent Results (from the past 2160 hours)  HM DIABETES EYE EXAM     Status: Abnormal   Collection Time: 04/10/23 12:00 AM  Result Value Ref Range   HM Diabetic Eye Exam Retinopathy (A) No Retinopathy  Basic metabolic panel     Status: Abnormal   Collection Time: 06/04/23  1:15 PM  Result Value Ref Range   Sodium 136 135 - 145 mmol/L   Potassium 5.6 (H) 3.5 - 5.1 mmol/L   Chloride 107 98 - 111 mmol/L   CO2 20 (L) 22 - 32 mmol/L   Glucose, Bld 210 (H) 70 - 99 mg/dL    Comment: Glucose reference range applies only to samples taken after fasting for at least 8 hours.   BUN 75 (H) 8 - 23 mg/dL   Creatinine, Ser 1.61 (H) 0.44 - 1.00 mg/dL   Calcium 8.9 8.9 - 09.6 mg/dL   GFR, Estimated 18 (L) >60 mL/min    Comment: (NOTE) Calculated using the CKD-EPI Creatinine Equation (2021)    Anion gap 9 5 - 15    Comment: Performed at [facility], [address] [place], [place] [address]  CBC with Differential     Status: Abnormal   Collection Time: 06/04/23  1:15 PM  Result Value Ref Range   WBC 8.7 4.0 - 10.5 K/uL   RBC 3.10 (L) 3.87 - 5.11 MIL/uL   Hemoglobin 9.0 (L) 12.0 - 15.0 g/dL   HCT 98.1 (L) 19.1 - 47.8 %   MCV 95.8 80.0 - 100.0 fL   MCH 29.0 26.0 - 34.0 pg   MCHC 30.3 30.0 - 36.0 g/dL   RDW 29.5 62.1 - 30.8 %   Platelets 248 150 - 400 K/uL   nRBC 0.0 0.0 - 0.2 %   Neutrophils Relative % 59 %   Neutro Abs 5.1 1.7 - 7.7 K/uL   Lymphocytes Relative 32 %   Lymphs Abs 2.8 0.7 - 4.0 K/uL   Monocytes Relative  6 %   Monocytes Absolute 0.5 0.1 - 1.0 K/uL   Eosinophils Relative 3 %   Eosinophils Absolute 0.3 0.0 - 0.5 K/uL   Basophils Relative 0 %   Basophils Absolute 0.0 0.0 - 0.1 K/uL   Immature Granulocytes 0 %   Abs Immature Granulocytes 0.03 0.00 - 0.07 K/uL    Comment: Performed at [facility], [address], [place], [place] [address]  CBG monitoring, ED     Status: Abnormal   Collection Time: 06/04/23  4:03 PM  Result Value Ref Range   Glucose-Capillary 206 (H) 70 - 99 mg/dL    Comment: Glucose reference range applies only to samples taken after fasting for at least 8 hours.  Resp panel by RT-PCR (RSV, Flu A&B, Covid) Anterior Nasal Swab     Status: None   Collection Time: 06/04/23  6:48 PM   Specimen: Anterior Nasal  Swab  Result Value Ref Range   SARS Coronavirus 2 by RT PCR NEGATIVE NEGATIVE   Influenza A by PCR NEGATIVE NEGATIVE   Influenza B by PCR NEGATIVE NEGATIVE    Comment: (NOTE) The Xpert Xpress SARS-CoV-2/FLU/RSV plus assay is intended as an aid in the diagnosis of influenza from Nasopharyngeal swab specimens and should not be used as a sole basis for treatment. Nasal washings and aspirates are unacceptable for Xpert Xpress SARS-CoV-2/FLU/RSV testing.  Fact Sheet for Patients: BloggerCourse.com  Fact Sheet for Healthcare Providers: SeriousBroker.it  This test is not yet approved or cleared by the [place] FDA and has been authorized for detection and/or diagnosis of SARS-CoV-2 by FDA under an Emergency Use Authorization (EUA). This EUA will remain in effect (meaning this test can be used) for the duration of the COVID-19 declaration under Section 564(b)(1) of the Act, 21 U.S.C. section 360bbb-3(b)(1), unless the authorization is terminated or revoked.     Resp Syncytial Virus by PCR NEGATIVE NEGATIVE    Comment: (NOTE) Fact Sheet for Patients: BloggerCourse.com  Fact Sheet  for Healthcare Providers: SeriousBroker.it  This test is not yet approved or cleared by the [place] FDA and has been authorized for detection and/or diagnosis of SARS-CoV-2 by FDA under an Emergency Use Authorization (EUA). This EUA will remain in effect (meaning this test can be used) for the duration of the COVID-19 declaration under Section 564(b)(1) of the Act, 21 U.S.C. section 360bbb-3(b)(1), unless the authorization is terminated or revoked.  Performed at [facility], [address], [place], [place] [address]   I-stat chem 8, ED (not at [facility], [facility] or [facility])     Status: Abnormal   Collection Time: 06/04/23  9:34 PM  Result Value Ref Range   Sodium 136 135 - 145 mmol/L   Potassium 6.6 (HH) 3.5 - 5.1 mmol/L   Chloride 105 98 - 111 mmol/L   BUN 79 (H) 8 - 23 mg/dL   Creatinine, Ser 2.44 (H) 0.44 - 1.00 mg/dL   Glucose, Bld 010 (H) 70 - 99 mg/dL    Comment: Glucose reference range applies only to samples taken after fasting for at least 8 hours.   Calcium, Ion 1.23 1.15 - 1.40 mmol/L   TCO2 22 22 - 32 mmol/L   Hemoglobin 9.5 (L) 12.0 - 15.0 g/dL   HCT 27.2 (L) 53.6 - 64.4 %  CBC     Status: Abnormal   Collection Time: 06/04/23 10:26 PM  Result Value Ref Range   WBC 12.4 (H) 4.0 - 10.5 K/uL   RBC 3.39 (L) 3.87 - 5.11 MIL/uL   Hemoglobin 9.9 (L) 12.0 - 15.0 g/dL   HCT 03.4 (L) 74.2 - 59.5 %   MCV 97.9 80.0 - 100.0 fL   MCH 29.2 26.0 - 34.0 pg   MCHC 29.8 (L) 30.0 - 36.0 g/dL   RDW 63.8 75.6 - 43.3 %   Platelets 238 150 - 400 K/uL   nRBC 0.0 0.0 - 0.2 %    Comment: Performed at [facility], [address] [place], [place] [address]  Creatinine, serum     Status: Abnormal   Collection Time: 06/04/23 10:26 PM  Result Value Ref Range   Creatinine, Ser 2.37 (H) 0.44 - 1.00 mg/dL   GFR, Estimated 19 (L) >60 mL/min    Comment: (NOTE) Calculated using the CKD-EPI Creatinine Equation (2021) Performed at [facility], [address] [place], [place] [address]   I-stat  chem 8, ED (not at [facility], [facility] or [facility])     Status: Abnormal   Collection Time: 06/04/23 10:38 PM  Result Value Ref Range   Sodium 137 135 - 145 mmol/L   Potassium 7.2 (HH) 3.5 - 5.1 mmol/L   Chloride 109 98 - 111 mmol/L   BUN 71 (H) 8 - 23 mg/dL   Creatinine, Ser 1.61 (H) 0.44 - 1.00 mg/dL   Glucose, Bld 096 (H) 70 - 99 mg/dL    Comment: Glucose reference range applies only to samples taken after fasting for at least 8 hours.   Calcium, Ion 1.14 (L) 1.15 - 1.40 mmol/L   TCO2 21 (L) 22 - 32 mmol/L   Hemoglobin 11.2 (L) 12.0 - 15.0 g/dL   HCT 04.5 (L) 40.9 - 81.1 %   Comment NOTIFIED PHYSICIAN   Urinalysis, w/ Reflex to Culture (Infection Suspected) -Urine, Clean Catch     Status: Abnormal   Collection Time: 06/05/23 12:37 AM  Result Value Ref Range   Specimen Source URINE, CLEAN CATCH    Color, Urine YELLOW YELLOW   APPearance CLEAR CLEAR   Specific Gravity, Urine 1.010 1.005 - 1.030   pH 5.0 5.0 - 8.0   Glucose, UA NEGATIVE NEGATIVE mg/dL   Hgb urine dipstick NEGATIVE NEGATIVE   Bilirubin Urine NEGATIVE NEGATIVE   Ketones, ur NEGATIVE NEGATIVE mg/dL   Protein, ur NEGATIVE NEGATIVE mg/dL   Nitrite NEGATIVE NEGATIVE   Leukocytes,Ua MODERATE (A) NEGATIVE   RBC / HPF 0-5 0 - 5 RBC/hpf   WBC, UA >50 0 - 5 WBC/hpf    Comment:        Reflex urine culture not performed if WBC <=10, OR if Squamous epithelial cells >5. If Squamous epithelial cells >5 suggest recollection.    Bacteria, UA MANY (A) NONE SEEN   Squamous Epithelial / HPF 0-5 0 - 5 /HPF   Mucus PRESENT     Comment: Performed at [facility], [address], [place], [place] [address]  Urine Culture     Status: Abnormal   Collection Time: 06/05/23 12:37 AM   Specimen: Urine, Random  Result Value Ref Range   Specimen Description URINE, RANDOM    Special Requests      NONE Reflexed from [id number] Performed at [facility], [address] [place], [place] [address]     Culture >=100,000 COLONIES/mL PROTEUS MIRABILIS (A)    Report Status 06/07/2023 FINAL    Organism ID, Bacteria PROTEUS MIRABILIS (A)       Susceptibility   Proteus mirabilis - MIC*    AMPICILLIN <=2 SENSITIVE Sensitive     CEFAZOLIN <=4 SENSITIVE Sensitive     CEFEPIME <=0.12 SENSITIVE Sensitive     CEFTRIAXONE <=0.25 SENSITIVE Sensitive     CIPROFLOXACIN <=0.25 SENSITIVE Sensitive     GENTAMICIN <=1 SENSITIVE Sensitive     IMIPENEM 2 SENSITIVE Sensitive     NITROFURANTOIN RESISTANT Resistant     TRIMETH/SULFA <=20 SENSITIVE Sensitive     AMPICILLIN/SULBACTAM <=2 SENSITIVE Sensitive     PIP/TAZO <=4 SENSITIVE Sensitive ug/mL    * >=100,000 COLONIES/mL PROTEUS MIRABILIS  CBG monitoring, ED     Status: Abnormal   Collection Time: 06/05/23 12:58 AM  Result Value Ref Range   Glucose-Capillary 231 (H) 70 - 99 mg/dL    Comment: Glucose reference range applies only to samples taken after fasting for at least 8 hours.  CBC     Status: Abnormal   Collection Time:  06/05/23  2:37 AM  Result Value Ref Range   WBC 11.5 (H) 4.0 - 10.5 K/uL   RBC 3.22 (L) 3.87 - 5.11 MIL/uL   Hemoglobin 9.5 (L) 12.0 - 15.0 g/dL   HCT 96.2 (L) 95.2 - 84.1 %   MCV 94.4 80.0 - 100.0 fL   MCH 29.5 26.0 - 34.0 pg   MCHC 31.3 30.0 - 36.0 g/dL   RDW 32.4 40.1 - 02.7 %   Platelets 242 150 - 400 K/uL   nRBC 0.0 0.0 - 0.2 %    Comment: Performed at [facility], [address] [place], [place] [address]  Comprehensive metabolic panel     Status: Abnormal   Collection Time: 06/05/23  2:37 AM  Result Value Ref Range   Sodium 131 (L) 135 - 145 mmol/L   Potassium 6.8 (HH) 3.5 - 5.1 mmol/L    Comment: CRITICAL RESULT CALLED TO, READ BACK BY AND VERIFIED WITH S. SIMS RN [phone] M. ALAMANO   Chloride 99 98 - 111 mmol/L   CO2 20 (L) 22 - 32 mmol/L   Glucose, Bld 270 (H) 70 - 99 mg/dL    Comment: Glucose reference range applies only to samples taken after fasting for at least 8 hours.   BUN 71 (H) 8 - 23  mg/dL   Creatinine, Ser 5.63 (H) 0.44 - 1.00 mg/dL   Calcium 9.5 8.9 - 87.5 mg/dL   Total Protein 7.9 6.5 - 8.1 g/dL   Albumin 3.1 (L) 3.5 - 5.0 g/dL   AST 16 15 - 41 U/L   ALT 12 0 - 44 U/L   Alkaline Phosphatase 94 38 - 126 U/L   Total Bilirubin 0.5 0.0 - 1.2 mg/dL   GFR, Estimated 20 (L) >60 mL/min    Comment: (NOTE) Calculated using the CKD-EPI Creatinine Equation (2021)    Anion gap 12 5 - 15    Comment: Performed at [facility], [address], [place], [place] [address]  Magnesium     Status: Abnormal   Collection Time: 06/05/23  2:37 AM  Result Value Ref Range   Magnesium 1.4 (L) 1.7 - 2.4 mg/dL    Comment: Performed at [facility], [address] [place], [place] [address]  Phosphorus     Status: None   Collection Time: 06/05/23  2:37 AM  Result Value Ref Range   Phosphorus 3.7 2.5 - 4.6 mg/dL    Comment: Performed at [facility], [address] [place], [place] [address]  CBG monitoring, ED     Status: Abnormal   Collection Time: 06/05/23  4:03 AM  Result Value Ref Range   Glucose-Capillary 253 (H) 70 - 99 mg/dL    Comment: Glucose reference range applies only to samples taken after fasting for at least 8 hours.  CBG monitoring, ED     Status: Abnormal   Collection Time: 06/05/23  7:47 AM  Result Value Ref Range   Glucose-Capillary 253 (H) 70 - 99 mg/dL    Comment: Glucose reference range applies only to samples taken after fasting for at least 8 hours.  Potassium     Status: Abnormal   Collection Time: 06/05/23  8:21 AM  Result Value Ref Range   Potassium 5.7 (H) 3.5 - 5.1 mmol/L    Comment: Performed at [facility], [address], [place], [place] [address]  CBG monitoring, ED     Status: Abnormal   Collection Time:  06/05/23 11:45 AM  Result Value Ref Range   Glucose-Capillary 230 (H) 70 - 99 mg/dL    Comment: Glucose reference range applies only to samples taken after fasting for at least 8 hours.  Potassium     Status: Abnormal    Collection Time: 06/05/23 12:40 PM  Result Value Ref Range   Potassium 5.8 (H) 3.5 - 5.1 mmol/L    Comment: Performed at [facility], [address] [place], [place] [address]  Potassium     Status: Abnormal   Collection Time: 06/05/23  3:40 PM  Result Value Ref Range   Potassium 6.3 (HH) 3.5 - 5.1 mmol/L    Comment: CALLED DIRECT RN AND CHARGE RN @1615  06/05/2023, NO ANSWER CRITICAL RESULT CALLED TO, READ BACK BY AND VERIFIED WITH M.DOSS,RN @1628  06/05/2023 [email or web address] Performed at [facility], [address], [place], [place] [address]   Potassium     Status: Abnormal   Collection Time: 06/05/23  8:31 PM  Result Value Ref Range   Potassium 6.8 (HH) 3.5 - 5.1 mmol/L    Comment: CRITICAL RESULT CALLED TO, READ BACK BY AND VERIFIED WITH C.ONEIL RN [phone] Performed at [facility], [address] [place], [place] [address]   Glucose, capillary     Status: Abnormal   Collection Time: 06/05/23  8:46 PM  Result Value Ref Range   Glucose-Capillary 216 (H) 70 - 99 mg/dL    Comment: Glucose reference range applies only to samples taken after fasting for at least 8 hours.  CBC     Status: Abnormal   Collection Time: 06/06/23  6:11 AM  Result Value Ref Range   WBC 15.1 (H) 4.0 - 10.5 K/uL   RBC 2.63 (L) 3.87 - 5.11 MIL/uL   Hemoglobin 7.7 (L) 12.0 - 15.0 g/dL   HCT 08.6 (L) 57.8 - 46.9 %   MCV 93.2 80.0 - 100.0 fL   MCH 29.3 26.0 - 34.0 pg   MCHC 31.4 30.0 - 36.0 g/dL   RDW 62.9 52.8 - 41.3 %   Platelets 216 150 - 400 K/uL   nRBC 0.0 0.0 - 0.2 %    Comment: Performed at [facility], [address] [place], [place] [address]  Basic metabolic panel     Status: Abnormal   Collection Time: 06/06/23  6:11 AM  Result Value Ref Range   Sodium 135 135 - 145 mmol/L   Potassium 5.7 (H) 3.5 - 5.1 mmol/L   Chloride 104 98 - 111 mmol/L   CO2 21 (L) 22 - 32 mmol/L   Glucose, Bld 164 (H) 70 - 99 mg/dL    Comment: Glucose reference range applies only to  samples taken after fasting for at least 8 hours.   BUN 76 (H) 8 - 23 mg/dL   Creatinine, Ser 0.27 (H) 0.44 - 1.00 mg/dL   Calcium 8.5 (L) 8.9 - 10.3 mg/dL   GFR, Estimated 18 (L) >60 mL/min    Comment: (NOTE) Calculated using the CKD-EPI Creatinine Equation (2021)    Anion gap 10 5 - 15    Comment: Performed at [facility], [address] [place], [place] [address]  Magnesium     Status: Abnormal   Collection Time: 06/06/23  6:11 AM  Result Value Ref Range   Magnesium 1.5 (L) 1.7 - 2.4 mg/dL    Comment: Performed at [facility], [address], [place], [place] [address]  Potassium     Status: Abnormal   Collection Time: 06/06/23  6:12 AM  Result Value Ref Range   Potassium 5.7 (H) 3.5 - 5.1 mmol/L    Comment: Performed at [facility], [address], [place], [place] [address]  Glucose, capillary     Status: Abnormal   Collection Time: 06/06/23  9:49 AM  Result Value Ref Range   Glucose-Capillary 121 (H) 70 - 99 mg/dL    Comment: Glucose reference range applies only to samples taken after fasting for at least 8 hours.  Glucose, capillary     Status: Abnormal   Collection Time: 06/06/23 12:11 PM  Result Value Ref Range   Glucose-Capillary 120 (H) 70 - 99 mg/dL    Comment: Glucose reference range applies only to samples taken after fasting for at least 8 hours.  Glucose, capillary     Status: Abnormal   Collection Time: 06/06/23  4:09 PM  Result Value Ref Range   Glucose-Capillary 204 (H) 70 - 99 mg/dL    Comment: Glucose reference range applies only to samples taken after fasting for at least 8 hours.  Glucose, capillary     Status: Abnormal   Collection Time: 06/06/23  9:33 PM  Result Value Ref Range   Glucose-Capillary 200 (H) 70 - 99 mg/dL    Comment: Glucose reference range applies only to samples taken after fasting for at least 8 hours.  Glucose, capillary     Status: Abnormal   Collection Time: 06/07/23 12:46 AM  Result Value Ref Range    Glucose-Capillary 160 (H) 70 - 99 mg/dL    Comment: Glucose reference range applies only to samples taken after fasting for at least 8 hours.   Comment 1 Document in Chart   Glucose, capillary     Status: Abnormal   Collection Time: 06/07/23  5:36 AM  Result Value Ref Range   Glucose-Capillary 141 (H) 70 - 99 mg/dL    Comment: Glucose reference range applies only to samples taken after fasting for at least 8 hours.   Comment 1 Document in Chart   CBC     Status: Abnormal   Collection Time: 06/07/23  6:11 AM  Result Value Ref Range   WBC 11.5 (H) 4.0 - 10.5 K/uL   RBC 2.74 (L) 3.87 - 5.11 MIL/uL   Hemoglobin 8.1 (L) 12.0 - 15.0 g/dL   HCT 62.1 (L) 30.8 - 65.7 %   MCV 93.4 80.0 - 100.0 fL   MCH 29.6 26.0 - 34.0 pg   MCHC 31.6 30.0 - 36.0 g/dL   RDW 84.6 96.2 - 95.2 %   Platelets 223 150 - 400 K/uL   nRBC 0.0 0.0 - 0.2 %    Comment: Performed at [facility], [address] [place], [place] [address]  Basic metabolic panel     Status: Abnormal   Collection Time: 06/07/23  6:11 AM  Result Value Ref Range   Sodium 137 135 - 145 mmol/L   Potassium 5.2 (H) 3.5 - 5.1 mmol/L   Chloride 107 98 - 111 mmol/L   CO2 20 (L) 22 - 32 mmol/L   Glucose, Bld 140 (H) 70 - 99 mg/dL    Comment: Glucose reference range applies only to samples taken after fasting for at least 8 hours.   BUN 81 (H) 8 - 23 mg/dL   Creatinine, Ser 4.40 (H) 0.44 - 1.00 mg/dL   Calcium 8.1 (L) 8.9 - 10.3 mg/dL   GFR,  Estimated 19 (L) >60 mL/min    Comment: (NOTE) Calculated using the CKD-EPI Creatinine Equation (2021)    Anion gap 10 5 - 15    Comment: Performed at [facility], [address] [place], [place] [address]  Sedimentation rate     Status: Abnormal   Collection Time: 06/07/23  6:11 AM  Result Value Ref Range   Sed Rate 103 (H) 0 - 22 mm/hr    Comment: Performed at [facility], [address], [place], [place] [address]  Glucose, capillary     Status: Abnormal   Collection Time:  06/07/23  7:53 AM  Result Value Ref Range   Glucose-Capillary 133 (H) 70 - 99 mg/dL    Comment: Glucose reference range applies only to samples taken after fasting for at least 8 hours.  Glucose, capillary     Status: Abnormal   Collection Time: 06/07/23 11:36 AM  Result Value Ref Range   Glucose-Capillary 170 (H) 70 - 99 mg/dL    Comment: Glucose reference range applies only to samples taken after fasting for at least 8 hours.  Glucose, capillary     Status: Abnormal   Collection Time: 06/07/23  3:59 PM  Result Value Ref Range   Glucose-Capillary 204 (H) 70 - 99 mg/dL    Comment: Glucose reference range applies only to samples taken after fasting for at least 8 hours.   Comment 1 Notify RN    Comment 2 Document in Chart   Glucose, capillary     Status: Abnormal   Collection Time: 06/07/23  9:57 PM  Result Value Ref Range   Glucose-Capillary 175 (H) 70 - 99 mg/dL    Comment: Glucose reference range applies only to samples taken after fasting for at least 8 hours.  Glucose, capillary     Status: Abnormal   Collection Time: 06/08/23 12:27 AM  Result Value Ref Range   Glucose-Capillary 115 (H) 70 - 99 mg/dL    Comment: Glucose reference range applies only to samples taken after fasting for at least 8 hours.  Glucose, capillary     Status: Abnormal   Collection Time: 06/08/23  6:04 AM  Result Value Ref Range   Glucose-Capillary 124 (H) 70 - 99 mg/dL    Comment: Glucose reference range applies only to samples taken after fasting for at least 8 hours.  Basic metabolic panel     Status: Abnormal   Collection Time: 06/08/23  7:05 AM  Result Value Ref Range   Sodium 138 135 - 145 mmol/L   Potassium 4.8 3.5 - 5.1 mmol/L   Chloride 108 98 - 111 mmol/L   CO2 21 (L) 22 - 32 mmol/L   Glucose, Bld 123 (H) 70 - 99 mg/dL    Comment: Glucose reference range applies only to samples taken after fasting for at least 8 hours.   BUN 69 (H) 8 - 23 mg/dL   Creatinine, Ser 0.98 (H) 0.44 - 1.00 mg/dL    Calcium 8.2 (L) 8.9 - 10.3 mg/dL   GFR, Estimated 24 (L) >60 mL/min    Comment: (NOTE) Calculated using the CKD-EPI Creatinine Equation (2021)    Anion gap 9 5 - 15    Comment: Performed at [facility], [address], [place], [place] [address]  Magnesium     Status: Abnormal   Collection Time: 06/08/23  7:05 AM  Result Value Ref Range   Magnesium 1.4 (L) 1.7 - 2.4 mg/dL    Comment:  Performed at [facility], [address], [place], [place] [address]  Glucose, capillary     Status: Abnormal   Collection Time: 06/08/23 10:24 AM  Result Value Ref Range   Glucose-Capillary 189 (H) 70 - 99 mg/dL    Comment: Glucose reference range applies only to samples taken after fasting for at least 8 hours.        Garner Nash, MD, MS

## 2023-06-19 NOTE — Telephone Encounter (Signed)
Patient dropped off document Home Health Certificate (Order ID  ), to be filled out by provider. Patient requested to send it back via Fax within 7-days. Document is located in providers tray at front office.Please advise at Mobile [phone] (mobile)   Home health cert. I put in the dr box

## 2023-06-20 DIAGNOSIS — N189 Chronic kidney disease, unspecified: Secondary | ICD-10-CM | POA: Diagnosis not present

## 2023-06-20 DIAGNOSIS — E875 Hyperkalemia: Secondary | ICD-10-CM | POA: Diagnosis not present

## 2023-06-20 DIAGNOSIS — N2581 Secondary hyperparathyroidism of renal origin: Secondary | ICD-10-CM | POA: Diagnosis not present

## 2023-06-20 DIAGNOSIS — D631 Anemia in chronic kidney disease: Secondary | ICD-10-CM | POA: Diagnosis not present

## 2023-06-20 DIAGNOSIS — E1122 Type 2 diabetes mellitus with diabetic chronic kidney disease: Secondary | ICD-10-CM | POA: Diagnosis not present

## 2023-06-20 DIAGNOSIS — N183 Chronic kidney disease, stage 3 unspecified: Secondary | ICD-10-CM | POA: Diagnosis not present

## 2023-06-20 DIAGNOSIS — I129 Hypertensive chronic kidney disease with stage 1 through stage 4 chronic kidney disease, or unspecified chronic kidney disease: Secondary | ICD-10-CM | POA: Diagnosis not present

## 2023-06-20 DIAGNOSIS — I509 Heart failure, unspecified: Secondary | ICD-10-CM | POA: Diagnosis not present

## 2023-06-20 DIAGNOSIS — E1129 Type 2 diabetes mellitus with other diabetic kidney complication: Secondary | ICD-10-CM | POA: Diagnosis not present

## 2023-06-20 NOTE — Telephone Encounter (Signed)
Form completed and faxed.

## 2023-06-21 ENCOUNTER — Telehealth: Payer: Self-pay | Admitting: Family Medicine

## 2023-06-21 ENCOUNTER — Encounter: Payer: Self-pay | Admitting: Internal Medicine

## 2023-06-21 ENCOUNTER — Ambulatory Visit (INDEPENDENT_AMBULATORY_CARE_PROVIDER_SITE_OTHER): Payer: PPO | Admitting: Internal Medicine

## 2023-06-21 VITALS — BP 124/70 | HR 74 | Ht 67.0 in

## 2023-06-21 DIAGNOSIS — E1165 Type 2 diabetes mellitus with hyperglycemia: Secondary | ICD-10-CM

## 2023-06-21 DIAGNOSIS — E1140 Type 2 diabetes mellitus with diabetic neuropathy, unspecified: Secondary | ICD-10-CM

## 2023-06-21 DIAGNOSIS — N184 Chronic kidney disease, stage 4 (severe): Secondary | ICD-10-CM

## 2023-06-21 DIAGNOSIS — E1122 Type 2 diabetes mellitus with diabetic chronic kidney disease: Secondary | ICD-10-CM | POA: Diagnosis not present

## 2023-06-21 DIAGNOSIS — Z794 Long term (current) use of insulin: Secondary | ICD-10-CM

## 2023-06-21 LAB — POCT GLYCOSYLATED HEMOGLOBIN (HGB A1C): Hemoglobin A1C: 7.7 % — AB (ref 4.0–5.6)

## 2023-06-21 MED ORDER — FREESTYLE LIBRE 3 READER DEVI: 0 refills | Status: DC

## 2023-06-21 MED ORDER — TOUJEO MAX SOLOSTAR 300 UNIT/ML ~~LOC~~ SOPN
28.0000 [IU] | PEN_INJECTOR | Freq: Every day | SUBCUTANEOUS | 3 refills | Status: DC
Start: 2023-06-21 — End: 2023-10-07

## 2023-06-21 MED ORDER — OZEMPIC (0.25 OR 0.5 MG/DOSE) 2 MG/3ML ~~LOC~~ SOPN: 0.5000 mg | PEN_INJECTOR | SUBCUTANEOUS | 3 refills | Status: DC

## 2023-06-21 MED ORDER — FREESTYLE LIBRE 3 PLUS SENSOR MISC: 1.0000 | 3 refills | Status: DC

## 2023-06-21 MED ORDER — NOVOLOG FLEXPEN 100 UNIT/ML ~~LOC~~ SOPN: PEN_INJECTOR | SUBCUTANEOUS | 4 refills | Status: DC

## 2023-06-21 NOTE — Telephone Encounter (Signed)
Patient dropped off document Home Health Certificate (Order ID [id number]), to be filled out by provider. Patient requested to send it back via Fax within 7-days. Document is located in providers tray at front office.Please advise at  [phone]

## 2023-06-21 NOTE — Progress Notes (Unsigned)
Name: [name]  Age/ Sex: 89 y.o., female   MRN/ DOB: [id number], 11-Jun-1934     PCP: [name], MD   Reason for Endocrinology Evaluation: Type 2 Diabetes Mellitus     Initial Endocrinology Clinic Visit: 08/19/2018    PATIENT IDENTIFIER: [name] is a 89 y.o. female with a past medical history of T2DM, CKD III, and chronic pain syndrome . The patient has followed with Endocrinology clinic since 08/19/2018 for consultative assistance with management of her diabetes.  DIABETIC HISTORY:  [name] was diagnosed with T2DM > 40 yrs ago. Pt is a poor historian and unable to recall oral glycemic agents. She has been on insulin therapy for years. Her hemoglobin A1c has ranged from 6.7%  in 07/2017, peaking at 12.1% in 2018.  SUBJECTIVE:   During the last visit (02/16/2022): A1c 7.1 %    Today (06/21/2023): Ms. [name] is here for a follow up visit on diabetes management.  She is accompanied by her daughter and granddaughter.  They use the CGM at home . She has  been noted with hypoglycemia on freestyle libre .  She is symptomatic with these episodes   She has NOT been to our clinic in 10 months.    She was hospitalized for hyperkalemia, this was attributed to dietary intake of fruit juice She follows with nephrology for CKD   She was seen by podiatry 06/13/2023 She also had a follow-up with cardiology  for CHF She had a follow-up with oncology for history of left sided invasive ductal carcinoma  Denies nausea or vomiting ,  Denies diarrhea diarrhea chronic constipation    HOME DIABETES REGIMEN:  Toujeo 36  units daily - 34 units  Novolog 14 units with Brunch and 14 units with Supper  Ozempic 0.5 mg weekly-continues to take 0.25 mg CF : BG- 150/30    CONTINUOUS GLUCOSE MONITORING RECORD INTERPRETATION    Dates of Recording: 1/31-2/13/2025  Sensor description:freestyle libre  Results statistics:   CGM use % of time 59  Average and SD 146/42.0   Time in range 65 %  % Time Above 180 20  % Time above 250 8  % Time Below target 4     Glycemic patterns summary: BGs are optimal overnight and fluctuate during the day Hyperglycemic episodes postprandial  Hypoglycemic episodes occurred at night  Overnight periods: trends down         HISTORY:  Past Medical History:  Past Medical History:  Diagnosis Date   Abdominal pain 01/26/2012   Acute deep vein thrombosis (DVT) of femoral vein of right lower extremity (HCC) 03/26/2022   Acute kidney injury superimposed on chronic kidney disease (HCC) 12/18/2014   Acute upper GI bleed 03/26/2022   Allergy    takes Mucinex daily as needed   Anemia, unspecified    Anxiety    takes Clonazepam daily as needed   Anxiety associated with depression 06/08/2017   Arthritis    Breast cancer (HCC)    Breast cancer screening 02/19/2014   Breast mass 10/27/2014   Chronic diastolic CHF (congestive heart failure) (HCC)    takes Furosemide daily   Chronic pain syndrome 02/06/2015   CKD (chronic kidney disease), stage III (HCC)    Coronary artery disease    a. s/p IMI 2004 tx with BMS to RCA;  b. s/p Promus DES to RCA 2/12 c. abnormal nuc 2016 -> cath with med rx. d. NSTEMI 02/2020  mv CAD with severe ISR in pRCA and m/dRCA lesion both treated with DES.   Debility 08/01/2012   Dementia (HCC)    Depression    takes Cymbalta daily   Diabetes mellitus    insulin daily   DOE (dyspnea on exertion) 06/24/2014   Dysphagia, unspecified(787.20) 07/31/2012   Fungal dermatitis 11/13/2007   Qualifier: Diagnosis of  By: Amador Cunas  MD, Janett Labella    GERD (gastroesophageal reflux disease)    takes Protonix daily   Gout, unspecified    Headache    occasionally   History of anemia due to chronic kidney disease 03/26/2022   History of blood clots    History of deep venous thrombosis 01/27/2012   History of pulmonary embolus (PE) 04/22/2014   Hyperkalemia 07/26/2017   Hyperlipidemia    takes  Pravastatin daily   Hypertension    Insomnia    Joint pain    Joint swelling    Morbid obesity (HCC)    Multiple benign nevi 11/15/2016   Myocardial infarction (HCC) 2004   Non-STEMI (non-ST elevated myocardial infarction) (HCC) 02/15/2020   Numbness    Obstructive sleep apnea    does not wear cpap   Osteoarthritis    Osteoarthritis    Osteoarthrosis, unspecified whether generalized or localized, lower leg    Pain in the chest 12/31/2013   Pain, chronic    Personal history of other diseases of the digestive system 12/03/2006   Qualifier: Diagnosis of  By: Amador Cunas  MD, Janett Labella    Polymyalgia rheumatica [facility])    Presence of stent in right coronary artery    Pulmonary emboli (HCC) 01/2012   felt to need lifelong anticoagulation but Xarelto dc in 02/2020 due to need for DAPT   Situational depression 06/04/2009   Qualifier: Diagnosis of  By: Amador Cunas  MD, Janett Labella    Syncope, vasovagal 07/04/2021   Type 2 diabetes mellitus with hyperlipidemia (HCC) 02/16/2022   Urinary incontinence    takes Linzess daily   Vaginitis and vulvovaginitis 06/23/2016   Vertigo    hx of;was taking Meclizine if needed   Wears dentures    Past Surgical History:  Past Surgical History:  Procedure Laterality Date   ABDOMINAL HYSTERECTOMY     partial   APPENDECTOMY     blood clots/legs and lungs  2013   BREAST BIOPSY Left 07/22/2014   BREAST BIOPSY Left 02/10/2013   BREAST LUMPECTOMY Left 11/05/2014   BREAST LUMPECTOMY WITH RADIOACTIVE SEED LOCALIZATION Left 11/05/2014   Procedure: LEFT BREAST LUMPECTOMY WITH RADIOACTIVE SEED LOCALIZATION;  Surgeon: Abigail Miyamoto, MD;  Location: [facility] OR;  Service: General;  Laterality: Left;   CARDIAC CATHETERIZATION     COLONOSCOPY     CORONARY ANGIOPLASTY  2   CORONARY STENT INTERVENTION N/A 02/17/2020   Procedure: CORONARY STENT INTERVENTION;  Surgeon: Marykay Lex, MD;  Location: [facility] INVASIVE CV LAB;  Service: Cardiovascular;  Laterality: N/A;    ESOPHAGOGASTRODUODENOSCOPY (EGD) WITH PROPOFOL N/A 11/07/2016   Procedure: ESOPHAGOGASTRODUODENOSCOPY (EGD) WITH PROPOFOL;  Surgeon: Iva Boop, MD;  Location: [facility] ENDOSCOPY;  Service: Endoscopy;  Laterality: N/A;   ESOPHAGOGASTRODUODENOSCOPY (EGD) WITH PROPOFOL N/A 01/05/2023   Procedure: ESOPHAGOGASTRODUODENOSCOPY (EGD) WITH PROPOFOL;  Surgeon: Hilarie Fredrickson, MD;  Location: [facility] ENDOSCOPY;  Service: Gastroenterology;  Laterality: N/A;   EXCISION OF SKIN TAG Right 11/05/2014   Procedure: EXCISION OF RIGHT EYELID SKIN TAG;  Surgeon: Abigail Miyamoto, MD;  Location: [facility] OR;  Service: General;  Laterality: Right;   EYE SURGERY  Bilateral    cataract    GASTRIC BYPASS  1977    reversed in 1979, [facility]   LEFT HEART CATH AND CORONARY ANGIOGRAPHY N/A 02/17/2020   Procedure: LEFT HEART CATH AND CORONARY ANGIOGRAPHY;  Surgeon: Marykay Lex, MD;  Location: [facility] INVASIVE CV LAB;  Service: Cardiovascular;  Laterality: N/A;   LEFT HEART CATHETERIZATION WITH CORONARY ANGIOGRAM N/A 06/29/2014   Procedure: LEFT HEART CATHETERIZATION WITH CORONARY ANGIOGRAM;  Surgeon: Lennette Bihari, MD;  Location: [facility] CATH LAB;  Service: Cardiovascular;  Laterality: N/A;   MEMBRANE PEEL Right 10/23/2018   Procedure: MEMBRANE PEEL;  Surgeon: Edmon Crape, MD;  Location: [facility] OR;  Service: Ophthalmology;  Laterality: Right;   MI with stent placement  2004   PARS PLANA VITRECTOMY Right 10/23/2018   Procedure: PARS PLANA VITRECTOMY WITH 25 GAUGE;  Surgeon: Edmon Crape, MD;  Location: [facility] OR;  Service: Ophthalmology;  Laterality: Right;   Social History:  reports that she has never smoked. She has been exposed to tobacco smoke. She has never used smokeless tobacco. She reports that she does not drink alcohol and does not use drugs. Family History:  Family History  Problem Relation Age of Onset   Breast cancer Mother 76   Heart disease Mother    Throat cancer Father    Hypertension Father    Arthritis Father    Diabetes  Father    Arthritis Sister    Obesity Sister    Diabetes Sister    Heart disease Cousin    Colon cancer Neg Hx    Stomach cancer Neg Hx    Esophageal cancer Neg Hx      HOME MEDICATIONS: Allergies as of 06/21/2023       Reactions   Sulfonamide Derivatives Swelling, Other (See Comments)   Mouth swelling- no resp issues noted   Duloxetine    Headaches   Lokelma [sodium Zirconium Cyclosilicate] Other (See Comments)   Headache, stomach upset   Aricept [donepezil Hcl] Diarrhea, Nausea And Vomiting, Other (See Comments)   GI upset/loose stools   Tramadol Nausea And Vomiting        Medication List        Accurate as of June 21, 2023  1:48 PM. If you have any questions, ask your nurse or doctor.          albuterol 108 (90 Base) MCG/ACT inhaler Commonly known as: ProAir HFA Inhale 1-2 puffs into the lungs every 6 (six) hours as needed for wheezing or shortness of breath.   amitriptyline 75 MG tablet Commonly known as: ELAVIL Take 1 tablet (75 mg total) by mouth at bedtime.   atorvastatin 80 MG tablet Commonly known as: LIPITOR TAKE 1 TABLET BY MOUTH EVERY DAY What changed: when to take this   azelastine 0.1 % nasal spray Commonly known as: ASTELIN Place 2 sprays into both nostrils 2 (two) times daily.   butalbital-acetaminophen-caffeine 50-325-40 MG tablet Commonly known as: FIORICET Take 1 tablet by mouth every 6 (six) hours as needed for headache.   carvedilol 12.5 MG tablet Commonly known as: COREG TAKE 1 TABLET BY MOUTH 2 TIMES DAILY   cetirizine 10 MG tablet Commonly known as: ZYRTEC Take 10 mg by mouth daily as needed for allergies or rhinitis.   diclofenac Sodium 1 % Gel Commonly known as: Voltaren Apply 2 g topically 4 (four) times daily. What changed:  when to take this reasons to take this   docusate sodium 100 MG capsule Commonly known  as: COLACE Take 2 capsules (200 mg total) by mouth daily.   doxycycline 100 MG tablet Commonly  known as: VIBRA-TABS Take 1 tablet (100 mg total) by mouth 2 (two) times daily for 10 days.   Eliquis 5 MG Tabs tablet Generic drug: apixaban TAKE 1 TABLET BY MOUTH 2 TIMES DAILY   febuxostat 40 MG tablet Commonly known as: ULORIC TAKE 1 TABLET BY MOUTH EVERY DAY   fluticasone 50 MCG/ACT nasal spray Commonly known as: FLONASE Place 2 sprays into both nostrils daily.   folic acid 1 MG tablet Commonly known as: FOLVITE TAKE 1 TABLET BY MOUTH EVERY DAY   FreeStyle Libre 3 Plus Sensor Misc 1 Device by Other route every 14 (fourteen) days. Change sensor every 15 days. Started by: Johnney Ou Macsen Nuttall   FreeStyle Libre 3 Reader Devi Continuous glucose monitor Started by: Johnney Ou Undray Allman   glucagon 1 MG injection Inject 1 mg into the vein once as needed for up to 1 dose.   glucose 4 GM chewable tablet Chew 1 tablet (4 g total) by mouth as needed for low blood sugar.   hydrOXYzine 25 MG tablet Commonly known as: ATARAX TAKE 1 TABLET BY MOUTH EVERY 8 HOURS AS NEEDED FOR ANXIETY OR ITCHING (NASAL AND THROAT CONGESTION)   ipratropium-albuterol 0.5-2.5 (3) MG/3ML Soln Commonly known as: DUONEB Take 3 mLs by nebulization every 6 (six) hours as needed (cough, wheezing, or shortness of breath).   linaclotide 145 MCG Caps capsule Commonly known as: LINZESS Patient take 1 capsule daily.  May add an additional capsule for breakthrough constipation associated with opioid use What changed:  how much to take how to take this when to take this additional instructions   meclizine 25 MG tablet Commonly known as: ANTIVERT TAKE 1 TO 2 TABLETS BY MOUTH TWICE DAILY AS NEEDED FOR DIZZINESS   melatonin 5 MG Tabs Take 1 tablet (5 mg total) by mouth at bedtime as needed.   memantine 5 MG tablet Commonly known as: NAMENDA TAKE 2 TABLETS BY MOUTH EVERY MORNING and TAKE 1 TABLET BY MOUTH EVERY EVENING What changed:  how much to take how to take this when to take this additional  instructions   montelukast 10 MG tablet Commonly known as: SINGULAIR Take 1 tablet (10 mg total) by mouth at bedtime.   nitroGLYCERIN 0.4 MG SL tablet Commonly known as: Nitrostat Take one tablet under the tongue every 5 minutes as needed for chest pain What changed:  how much to take how to take this when to take this reasons to take this additional instructions   NovoLOG FlexPen 100 UNIT/ML FlexPen Generic drug: insulin aspart Max daily 45 units What changed: See the new instructions. Changed by: Johnney Ou Koa Palla   nystatin powder Commonly known as: MYCOSTATIN/NYSTOP Apply 1 Application topically 3 (three) times daily.   ondansetron 4 MG tablet Commonly known as: Zofran Take 1 tablet (4 mg total) by mouth every 8 (eight) hours as needed for nausea or vomiting.   Ozempic (0.25 or 0.5 MG/DOSE) 2 MG/3ML Sopn Generic drug: Semaglutide(0.25 or 0.5MG /DOS) Inject 0.5 mg into the skin once a week. What changed: when to take this   pantoprazole 20 MG tablet Commonly known as: PROTONIX TAKE 1 TABLET BY MOUTH EVERY DAY   torsemide 20 MG tablet Commonly known as: DEMADEX TAKE 1 TABLET BY MOUTH ONCE DAILY MAY TAKE AN EXTRA DOES FOR WEIGHT GAIN OF 3LBs IN 1 DAY OR 5LBS IN 1 WEEK OR FOR WORSENING SWELLING  Toujeo Max SoloStar 300 UNIT/ML Solostar Pen Generic drug: insulin glargine (2 Unit Dial) Inject 28 Units into the skin daily before breakfast. What changed: how much to take Changed by: Johnney Ou Jennesis Ramaswamy   Trelegy Ellipta 100-62.5-25 MCG/ACT Aepb Generic drug: Fluticasone-Umeclidin-Vilant Inhale 1 puff into the lungs daily.   TYLENOL 500 MG tablet Generic drug: acetaminophen Take 1,000 mg by mouth every 6 (six) hours as needed for mild pain or headache.   Tylenol PM Extra Strength 500-25 MG Tabs tablet Generic drug: diphenhydramine-acetaminophen Take 2 tablets by mouth at bedtime.   Vitamin D (Ergocalciferol) 1.25 MG (50000 UNIT) Caps capsule Commonly known  as: DRISDOL TAKE 1 CAPSULE BY MOUTH ONCE WEEKLY        PHYSICAL EXAM: VS: BP 124/70 (BP Location: Left Wrist, Patient Position: Sitting, Cuff Size: Normal)   Pulse 74   Ht 5\' 7"  (1.702 m)   SpO2 90%   BMI 41.35 kg/m    EXAM: General: Pt appears well and is in NAD in a wheelchair   Lungs: Clear with good BS bilat  Heart: Auscultation: RRR   Extremities:  BL LE: No pretibial edema    DM Foot Exam 06/13/2023 per podiatry     DATA REVIEWED:  Lab Results  Component Value Date   HGBA1C 7.7 (A) 06/21/2023   HGBA1C 7.2 (H) 01/03/2023   HGBA1C 7.8 (A) 08/21/2022     Latest Reference Range & Units 06/08/23 07:05  Sodium 135 - 145 mmol/L 138  Potassium 3.5 - 5.1 mmol/L 4.8  Chloride 98 - 111 mmol/L 108  CO2 22 - 32 mmol/L 21 (L)  Glucose 70 - 99 mg/dL 409 (H)  BUN 8 - 23 mg/dL 69 (H)  Creatinine 8.11 - 1.00 mg/dL 9.14 (H)  Calcium 8.9 - 10.3 mg/dL 8.2 (L)  Anion gap 5 - 15  9  Magnesium 1.7 - 2.4 mg/dL 1.4 (L)  (L): Data is abnormally low (H): Data is abnormally high    ASSESSMENT / PLAN / RECOMMENDATIONS:   1)Type 2 Diabetes Mellitus, Sub-Optimally  Controlled, With Neuropathic, CKD IV and macrovascular complications - Most recent A1c of 7.8 %. Goal A1c < 7.5 %.      - A1c is skewed due to CKD - Main barriers to diabetes care is memory loss.  -She is asking for Glp-1 agonist to help with weight loss, I had prescribed Trulicity in the past which was cost prohibitive and did not meet criteria for pt assistance  - Will reduce toujeo due to hypoglycemia  -Will prescribe Ozempic    MEDICATIONS: Decrease  Toujeo 36  units daily  Continue Novolog 14 units Twith Brunch and 14 units with Supper  Continue CF : BG- 150/30 TIDQAC Start Ozempic 0.25 mg weekly for 6 weeks, than increase to 0.5 mg weekly   EDUCATION / INSTRUCTIONS: BG monitoring instructions: Patient is instructed to check her blood sugars 4 times a day, before meals and bedtime. Call   Endocrinology clinic if: BG persistently < 70  I reviewed the Rule of 15 for the treatment of hypoglycemia in detail with the patient. Literature supplied.    F/U in 6 months    Signed electronically by: Lyndle Herrlich, MD  [facility] Endocrinology  [facility] [address] [place], [place] [address] Phone: [phone] FAX: [phone]   CC: [name], MD [address] [place] [address] Phone: [phone]  Fax: [phone]  Return to Endocrinology clinic as below: Future Appointments  Date Time Provider Department Center  06/25/2023  4:20 PM [facility]-ANNUAL WELLNESS VISIT [facility]  06/29/2023 12:30 PM [facility] None  06/29/2023  1:00 PM [name], MD [facility] None  07/05/2023 10:30 AM Fleeta Emmer, RN [facility] None  09/05/2023  1:00 PM Terri Piedra, DO [facility] None  01/14/2024 11:10 AM Jelesa Mangini, Konrad Dolores, MD [facility] None

## 2023-06-21 NOTE — Telephone Encounter (Signed)
CLINICAL USE BELOW THIS LINE (use X to signify action taken)  ___ Form received and placed in providers office for signature. __x_ Form completed and faxed to LOA Dept.  ___ Form completed & LVM to notify patient ready for pick up.  __x_ Charge sheet and copy of form in front office folder for office supervisor.

## 2023-06-21 NOTE — Patient Instructions (Addendum)
-   Decrease Toujeo 28 units daily  - Continue Novolog 7-14 units with each meals  - Increase Ozempic 0.5 mg ONCE weekly   - Novolog correctional insulin: ADD extra units on insulin to your meal-time Novolog dose if your blood sugars are higher than 180. Use the scale below to help guide you:   Blood sugar before meal Number of units to inject  Less than 180 0 unit  181 -  210 1 units  211 -  240 2 units  241 -  270 3 units  271 -  300 4 units  301 -  330 5 units  331 -  360 6 units  361 -  390 7 units     HOW TO TREAT LOW BLOOD SUGARS (Blood sugar LESS THAN 70 MG/DL) Please follow the RULE OF 15 for the treatment of hypoglycemia treatment (when your (blood sugars are less than 70 mg/dL)   STEP 1: Take 15 grams of carbohydrates when your blood sugar is low, which includes:  3-4 GLUCOSE TABS  OR 3-4 OZ OF JUICE OR REGULAR SODA OR ONE TUBE OF GLUCOSE GEL    STEP 2: RECHECK blood sugar in 15 MINUTES STEP 3: If your blood sugar is still low at the 15 minute recheck --> then, go back to

## 2023-06-22 ENCOUNTER — Encounter: Payer: Self-pay | Admitting: Internal Medicine

## 2023-06-22 ENCOUNTER — Telehealth: Payer: Self-pay

## 2023-06-22 LAB — COMPREHENSIVE METABOLIC PANEL
Albumin: 3.4 — AB (ref 3.5–5.0)
Calcium: 8.2 — AB (ref 8.7–10.7)
eGFR: 21

## 2023-06-22 LAB — CBC AND DIFFERENTIAL
HCT: 25 — AB (ref 36–46)
Hemoglobin: 8.2 — AB (ref 12.0–16.0)
Platelets: 228 10*3/uL (ref 150–400)
WBC: 11

## 2023-06-22 LAB — IRON,TIBC AND FERRITIN PANEL
%SAT: 23
Iron: 41
TIBC: 176
UIBC: 135

## 2023-06-22 LAB — BASIC METABOLIC PANEL
BUN: 71 — AB (ref 4–21)
CO2: 21 (ref 13–22)
Chloride: 109 — AB (ref 99–108)
Creatinine: 2.3 — AB (ref 0.5–1.1)
Glucose: 196
Potassium: 5 meq/L (ref 3.5–5.1)
Sodium: 139 (ref 137–147)

## 2023-06-22 LAB — CBC: RBC: 2.8 — AB (ref 3.87–5.11)

## 2023-06-22 LAB — VITAMIN D 25 HYDROXY (VIT D DEFICIENCY, FRACTURES): Vit D, 25-Hydroxy: 79.3

## 2023-06-22 NOTE — Telephone Encounter (Signed)
Copied from CRM [id number]. Topic: Clinical - Home Health Verbal Orders >> Jun 22, 2023  2:23 PM Orinda Kenner C wrote: Caller/Agency: Judeth Cornfield OT from [facility] [phone] needs verbal order for occupational therapy one time a weeks for 4 weeks and one time a week every other for 2 visits.

## 2023-06-22 NOTE — Telephone Encounter (Signed)
Left [name] a voicemail on her secure line with annotation below.

## 2023-06-22 NOTE — Telephone Encounter (Signed)
Ok for verbal orders ?

## 2023-06-27 ENCOUNTER — Encounter: Payer: Self-pay | Admitting: Nephrology

## 2023-06-28 ENCOUNTER — Other Ambulatory Visit: Payer: Self-pay

## 2023-06-28 DIAGNOSIS — J449 Chronic obstructive pulmonary disease, unspecified: Secondary | ICD-10-CM | POA: Diagnosis not present

## 2023-06-28 DIAGNOSIS — C50812 Malignant neoplasm of overlapping sites of left female breast: Secondary | ICD-10-CM

## 2023-06-28 DIAGNOSIS — D509 Iron deficiency anemia, unspecified: Secondary | ICD-10-CM

## 2023-06-29 ENCOUNTER — Inpatient Hospital Stay: Payer: PPO | Attending: Internal Medicine

## 2023-06-29 ENCOUNTER — Inpatient Hospital Stay: Payer: PPO | Admitting: Hematology

## 2023-06-29 VITALS — BP 135/48 | HR 75 | Temp 97.7°F | Resp 16

## 2023-06-29 DIAGNOSIS — Z86711 Personal history of pulmonary embolism: Secondary | ICD-10-CM | POA: Insufficient documentation

## 2023-06-29 DIAGNOSIS — Z7901 Long term (current) use of anticoagulants: Secondary | ICD-10-CM | POA: Diagnosis not present

## 2023-06-29 DIAGNOSIS — Z170 Estrogen receptor positive status [ER+]: Secondary | ICD-10-CM | POA: Diagnosis not present

## 2023-06-29 DIAGNOSIS — Z79899 Other long term (current) drug therapy: Secondary | ICD-10-CM | POA: Insufficient documentation

## 2023-06-29 DIAGNOSIS — C50912 Malignant neoplasm of unspecified site of left female breast: Secondary | ICD-10-CM | POA: Insufficient documentation

## 2023-06-29 DIAGNOSIS — I2510 Atherosclerotic heart disease of native coronary artery without angina pectoris: Secondary | ICD-10-CM | POA: Insufficient documentation

## 2023-06-29 DIAGNOSIS — N183 Chronic kidney disease, stage 3 unspecified: Secondary | ICD-10-CM | POA: Diagnosis not present

## 2023-06-29 DIAGNOSIS — D509 Iron deficiency anemia, unspecified: Secondary | ICD-10-CM

## 2023-06-29 DIAGNOSIS — E1122 Type 2 diabetes mellitus with diabetic chronic kidney disease: Secondary | ICD-10-CM | POA: Insufficient documentation

## 2023-06-29 DIAGNOSIS — R42 Dizziness and giddiness: Secondary | ICD-10-CM | POA: Diagnosis not present

## 2023-06-29 DIAGNOSIS — Z79811 Long term (current) use of aromatase inhibitors: Secondary | ICD-10-CM | POA: Insufficient documentation

## 2023-06-29 DIAGNOSIS — D631 Anemia in chronic kidney disease: Secondary | ICD-10-CM

## 2023-06-29 DIAGNOSIS — E559 Vitamin D deficiency, unspecified: Secondary | ICD-10-CM | POA: Insufficient documentation

## 2023-06-29 DIAGNOSIS — E1140 Type 2 diabetes mellitus with diabetic neuropathy, unspecified: Secondary | ICD-10-CM | POA: Diagnosis not present

## 2023-06-29 DIAGNOSIS — Z86718 Personal history of other venous thrombosis and embolism: Secondary | ICD-10-CM | POA: Insufficient documentation

## 2023-06-29 DIAGNOSIS — R110 Nausea: Secondary | ICD-10-CM

## 2023-06-29 LAB — CBC WITH DIFFERENTIAL (CANCER CENTER ONLY)
Abs Immature Granulocytes: 0.03 10*3/uL (ref 0.00–0.07)
Basophils Absolute: 0 10*3/uL (ref 0.0–0.1)
Basophils Relative: 0 %
Eosinophils Absolute: 0.2 10*3/uL (ref 0.0–0.5)
Eosinophils Relative: 3 %
HCT: 26.9 % — ABNORMAL LOW (ref 36.0–46.0)
Hemoglobin: 8.3 g/dL — ABNORMAL LOW (ref 12.0–15.0)
Immature Granulocytes: 0 %
Lymphocytes Relative: 25 %
Lymphs Abs: 2 10*3/uL (ref 0.7–4.0)
MCH: 29.5 pg (ref 26.0–34.0)
MCHC: 30.9 g/dL (ref 30.0–36.0)
MCV: 95.7 fL (ref 80.0–100.0)
Monocytes Absolute: 0.4 10*3/uL (ref 0.1–1.0)
Monocytes Relative: 5 %
Neutro Abs: 5.3 10*3/uL (ref 1.7–7.7)
Neutrophils Relative %: 67 %
Platelet Count: 236 10*3/uL (ref 150–400)
RBC: 2.81 MIL/uL — ABNORMAL LOW (ref 3.87–5.11)
RDW: 14 % (ref 11.5–15.5)
WBC Count: 7.9 10*3/uL (ref 4.0–10.5)
nRBC: 0 % (ref 0.0–0.2)

## 2023-06-29 LAB — CMP (CANCER CENTER ONLY)
ALT: 10 U/L (ref 0–44)
AST: 13 U/L — ABNORMAL LOW (ref 15–41)
Albumin: 3.2 g/dL — ABNORMAL LOW (ref 3.5–5.0)
Alkaline Phosphatase: 106 U/L (ref 38–126)
Anion gap: 7 (ref 5–15)
BUN: 76 mg/dL — ABNORMAL HIGH (ref 8–23)
CO2: 22 mmol/L (ref 22–32)
Calcium: 8.3 mg/dL — ABNORMAL LOW (ref 8.9–10.3)
Chloride: 107 mmol/L (ref 98–111)
Creatinine: 2.73 mg/dL — ABNORMAL HIGH (ref 0.44–1.00)
GFR, Estimated: 16 mL/min — ABNORMAL LOW (ref >60)
Glucose, Bld: 210 mg/dL — ABNORMAL HIGH (ref 70–99)
Potassium: 4.4 mmol/L (ref 3.5–5.1)
Sodium: 136 mmol/L (ref 135–145)
Total Bilirubin: 0.2 mg/dL (ref 0.0–1.2)
Total Protein: 6.6 g/dL (ref 6.5–8.1)

## 2023-06-29 LAB — IRON AND IRON BINDING CAPACITY (CC-WL,HP ONLY)
Iron: 44 ug/dL (ref 28–170)
Saturation Ratios: 24 % (ref 10.4–31.8)
TIBC: 181 ug/dL — ABNORMAL LOW (ref 250–450)
UIBC: 137 ug/dL — ABNORMAL LOW (ref 148–442)

## 2023-06-29 LAB — FERRITIN: Ferritin: 171 ng/mL (ref 11–307)

## 2023-06-29 MED ORDER — ONDANSETRON HCL 4 MG PO TABS: 4.0000 mg | ORAL_TABLET | Freq: Three times a day (TID) | ORAL | 0 refills | Status: DC | PRN

## 2023-06-29 MED ORDER — TRAMADOL HCL 50 MG PO TABS: 50.0000 mg | ORAL_TABLET | Freq: Two times a day (BID) | ORAL | 0 refills | Status: DC | PRN

## 2023-06-29 NOTE — Progress Notes (Signed)
 Hematology/Oncology Clinic Followup Note  Date of service: 06/29/23   Patient Care Team: Nichole Gunner, MD as PCP - General (Family Medicine) [name], Nichole Sans, MD as Consulting Physician (Cardiology) Nichole Rutherford, MD as Consulting Physician (Ophthalmology) Nichole Miyamoto, MD as Consulting Physician (General Surgery) Nichole Aschoff, MD (Inactive) as Referring Physician (Obstetrics and Gynecology) Nichole Axe Alford Highland, MD as Consulting Physician (Ophthalmology) [facility], Nichole Dolores, MD as Consulting Physician (Endocrinology) Nichole Maine, MD as Consulting Physician (Hematology) Nichole Dallas, DO as Consulting Physician (Neurology) Nichole Sprague, MD (Inactive) as Consulting Physician (Cardiology) Nichole Emmer, RN as V[facility] Care Management  CHIEF COMPLAINTS/PURPOSE OF CONSULTATION:  Follow-up for continued evaluation and management of breast cancer  DIAGNOSIS: left-sided presumed stage IA  (pT1c,pNx[cN0], cM0) invasive ductal carcinoma grade 2 out of 3, strongly ER +100%, strongly PR +100% and HER-2/neu negative. Given her high risk cardiac status lumpectomy had to be done under local anesthesia and sentinel lymph node biopsy was not possible. She has accompanying DCIS of intermediate grade present at margins. ECOG performance status is 3. -Had a mammogram/tomosynthesis done on 07/29/2015 which shows residual suspicious calcification in the left breast postero-medial to the biopsy site. Has known residual DCIS.  No evidence of new malignancy.  No evidence of right breast malignancy.   CURRENT TREATMENT: Arimidex 1mg  po daily  HISTORY OF PRESENTING ILLNESS: -please see my initial consultation for details of initial presentation   INTERVAL HISTORY:  [name] is a 89 y.o. female here for follow-up of her breast cancer and anemia.  She was last seen by me on 03/06/2023 and was doing well overall with no new medical complaints.   Patient presented to the ED on  06/04/2023 for hyperkalemia. She was seen by [name] 06/12/2023 for sinusitis.   Today, she presents in a wheelchair and is accompanied by an additional family member. Patient complains of back pain starting two weeks ago.  Her back pain begin hurting in the lower pelvis area and has been worsening. The area is tender to touch.   She complains of bilateral hearing loss.   She did have a fall prior to Christmas causing her to bump her head. Patient received imaging of the head and neck, which showed no new significant findings. There were findings of plenty of arthritis in the neck, though there was no acute fracture or dislocations. Patient notes that this incident was her first fall in a long time. Her fall occurred after her foot became stuck when attempting to turn in her den. She does complain of lightheadedness/dizziness. She reports that her recent fall did not bother her back.   Patient is off of Arimidex and denies any new breast symptoms.  She reports that she has previously tried taking Tramadol years ago and did not tolerate it well. She reports that she cannot take oxycodone for pain management due to concerns of nausea. Patient was noted to have an intolerance of Nausea and vomiting with narcotics.   She reports that she generally takes 3 tablets of extra-strength Tylenol in morning and 3 tablets at night.   She denies any abdominal pain or leg swelling.   Her nephrologist is [name]. She reports that she will receive one iron infusion on Tuesday, 07/03/2023, which was set up by her nephrologist. She is not set up for hormonal shots for anemia related to CKD.  She complains of burning on the side of  her leg from neuropathy. [name] is her DM  doctor.   She reports that she will be traveling to [place], [place] in March and will return in April 2025.   MEDICAL HISTORY:  Past Medical History:  Diagnosis Date   Abdominal pain 01/26/2012   Acute deep vein thrombosis (DVT)  of femoral vein of right lower extremity (HCC) 03/26/2022   Acute kidney injury superimposed on chronic kidney disease (HCC) 12/18/2014   Acute upper GI bleed 03/26/2022   Allergy    takes Mucinex daily as needed   Anemia, unspecified    Anxiety    takes Clonazepam daily as needed   Anxiety associated with depression 06/08/2017   Arthritis    Breast cancer (HCC)    Breast cancer screening 02/19/2014   Breast mass 10/27/2014   Chronic diastolic CHF (congestive heart failure) (HCC)    takes Furosemide daily   Chronic pain syndrome 02/06/2015   CKD (chronic kidney disease), stage III (HCC)    Coronary artery disease    a. s/p IMI 2004 tx with BMS to RCA;  b. s/p Promus DES to RCA 2/12 c. abnormal nuc 2016 -> cath with med rx. d. NSTEMI 02/2020  mv CAD with severe ISR in pRCA and m/dRCA lesion both treated with DES.   Debility 08/01/2012   Dementia (HCC)    Depression    takes Cymbalta daily   Diabetes mellitus    insulin daily   DOE (dyspnea on exertion) 06/24/2014   Dysphagia, unspecified(787.20) 07/31/2012   Fungal dermatitis 11/13/2007   Qualifier: Diagnosis of  By: Nichole Cunas  MD, [name]    GERD (gastroesophageal reflux disease)    takes Protonix daily   Gout, unspecified    Headache    occasionally   History of anemia due to chronic kidney disease 03/26/2022   History of blood clots    History of deep venous thrombosis 01/27/2012   History of pulmonary embolus (PE) 04/22/2014   Hyperkalemia 07/26/2017   Hyperlipidemia    takes Pravastatin daily   Hypertension    Insomnia    Joint pain    Joint swelling    Morbid obesity (HCC)    Multiple benign nevi 11/15/2016   Myocardial infarction (HCC) 2004   Non-STEMI (non-ST elevated myocardial infarction) (HCC) 02/15/2020   Numbness    Obstructive sleep apnea    does not wear cpap   Osteoarthritis    Osteoarthritis    Osteoarthrosis, unspecified whether generalized or localized, lower leg    Pain in the chest  12/31/2013   Pain, chronic    Personal history of other diseases of the digestive system 12/03/2006   Qualifier: Diagnosis of  By: Nichole Cunas  MD, [name]    Polymyalgia rheumatica [facility])    Presence of stent in right coronary artery    Pulmonary emboli (HCC) 01/2012   felt to need lifelong anticoagulation but Xarelto dc in 02/2020 due to need for DAPT   Situational depression 06/04/2009   Qualifier: Diagnosis of  By: Nichole Cunas  MD, [name]    Syncope, vasovagal 07/04/2021   Type 2 diabetes mellitus with hyperlipidemia (HCC) 02/16/2022   Urinary incontinence    takes Linzess daily   Vaginitis and vulvovaginitis 06/23/2016   Vertigo    hx of;was taking Meclizine if needed   Wears dentures     SURGICAL HISTORY: Past Surgical History:  Procedure Laterality Date   ABDOMINAL HYSTERECTOMY     partial   APPENDECTOMY     blood clots/legs and lungs  2013   BREAST  BIOPSY Left 07/22/2014   BREAST BIOPSY Left 02/10/2013   BREAST LUMPECTOMY Left 11/05/2014   BREAST LUMPECTOMY WITH RADIOACTIVE SEED LOCALIZATION Left 11/05/2014   Procedure: LEFT BREAST LUMPECTOMY WITH RADIOACTIVE SEED LOCALIZATION;  Surgeon: Nichole Miyamoto, MD;  Location: [facility] OR;  Service: General;  Laterality: Left;   CARDIAC CATHETERIZATION     COLONOSCOPY     CORONARY ANGIOPLASTY  2   CORONARY STENT INTERVENTION N/A 02/17/2020   Procedure: CORONARY STENT INTERVENTION;  Surgeon: Marykay Lex, MD;  Location: [facility] INVASIVE CV LAB;  Service: Cardiovascular;  Laterality: N/A;   ESOPHAGOGASTRODUODENOSCOPY (EGD) WITH PROPOFOL N/A 11/07/2016   Procedure: ESOPHAGOGASTRODUODENOSCOPY (EGD) WITH PROPOFOL;  Surgeon: Iva Boop, MD;  Location: [facility] ENDOSCOPY;  Service: Endoscopy;  Laterality: N/A;   ESOPHAGOGASTRODUODENOSCOPY (EGD) WITH PROPOFOL N/A 01/05/2023   Procedure: ESOPHAGOGASTRODUODENOSCOPY (EGD) WITH PROPOFOL;  Surgeon: Hilarie Fredrickson, MD;  Location: [facility] ENDOSCOPY;  Service: Gastroenterology;  Laterality: N/A;   EXCISION  OF SKIN TAG Right 11/05/2014   Procedure: EXCISION OF RIGHT EYELID SKIN TAG;  Surgeon: Nichole Miyamoto, MD;  Location: [facility] OR;  Service: General;  Laterality: Right;   EYE SURGERY Bilateral    cataract    GASTRIC BYPASS  1977    reversed in 1979, [facility]   LEFT HEART CATH AND CORONARY ANGIOGRAPHY N/A 02/17/2020   Procedure: LEFT HEART CATH AND CORONARY ANGIOGRAPHY;  Surgeon: Marykay Lex, MD;  Location: [facility] INVASIVE CV LAB;  Service: Cardiovascular;  Laterality: N/A;   LEFT HEART CATHETERIZATION WITH CORONARY ANGIOGRAM N/A 06/29/2014   Procedure: LEFT HEART CATHETERIZATION WITH CORONARY ANGIOGRAM;  Surgeon: Lennette Bihari, MD;  Location[name] C[facility] LAB;  Service: Cardiovascular;  Laterality: N/A;   MEMBRANE PEEL Right 10/23/2018   Procedure: MEMBRANE PEEL;  Surgeon: Edmon Crape, MD;  [facility] OR;  Service: Ophthalmology;  Laterality: Right;   MI with stent placement  2004   PARS PLANA VITRECTOMY Right 10/23/2018   Procedure: PARS PLANA VITRECTOMY WITH 25 GAUGE;  Surgeon: Edmon Crape, MD;  Location: [facility] OR;  Service: Ophthalmology;  Laterality: Right;    SOCIAL HISTORY: Social History   Socioeconomic History   Marital status: Widowed    Spouse name: Not on file   Number of children: 3   Years of education: Not on file   Highest education level: High school graduate  Occupational History   Occupation: retired  Tobacco Use   Smoking status: Never    Passive exposure: Past   Smokeless tobacco: Never   Tobacco comments:    Both parents smoked, patient was exposed/ "Raised up in smoke, my whole life."  Vaping Use   Vaping status: Never Used  Substance and Sexual Activity   Alcohol use: No    Alcohol/week: 0.0 standard drinks of alcohol   Drug use: No   Sexual activity: Not on file  Other Topics Concern   Not on file  Social History Narrative   Not on file   Social Drivers of Health   Financial Resource Strain: Low Risk  (06/05/2022)   Overall Financial Resource  Strain (CARDIA)    Difficulty of Paying Living Expenses: Not hard at all  Food Insecurity: No Food Insecurity (06/11/2023)   Hunger Vital Sign    Worried About Running Out of Food in the Last Year: Never true    Ran Out of Food in the Last Year: Never true  Transportation Needs: No Transportation Needs (06/11/2023)   PRAPARE - Transportation    Lack of Transportation (  Medical): No    Lack of Transportation (Non-Medical): No  Physical Activity: Insufficiently Active (06/05/2022)   Exercise Vital Sign    Days of Exercise per Week: 3 days    Minutes of Exercise per Session: 10 min  Stress: Stress Concern Present (06/05/2022)   [employer] of Occupational Health - Occupational Stress Questionnaire    Feeling of Stress : Rather much  Social Connections: Socially Isolated (06/05/2023)   Social Connection and Isolation Panel [NHANES]    Frequency of Communication with Friends and Family: Once a week    Frequency of Social Gatherings with Friends and Family: More than three times a week    Attends Religious Services: Never    Database administrator or Organizations: No    Attends Banker Meetings: Never    Marital Status: Widowed  Intimate Partner Violence: Not At Risk (06/11/2023)   Humiliation, Afraid, Rape, and Kick questionnaire    Fear of Current or Ex-Partner: No    Emotionally Abused: No    Physically Abused: No    Sexually Abused: No    FAMILY HISTORY: Family History  Problem Relation Age of Onset   Breast cancer Mother 27   Heart disease Mother    Throat cancer Father    Hypertension Father    Arthritis Father    Diabetes Father    Arthritis Sister    Obesity Sister    Diabetes Sister    Heart disease Cousin    Colon cancer Neg Hx    Stomach cancer Neg Hx    Esophageal cancer Neg Hx     ALLERGIES:  is allergic to sulfonamide derivatives, duloxetine, lokelma [sodium zirconium cyclosilicate], aricept [donepezil hcl], and tramadol.  MEDICATIONS:  Current  Outpatient Medications  Medication Sig Dispense Refill   albuterol (PROAIR HFA) 108 (90 Base) MCG/ACT inhaler Inhale 1-2 puffs into the lungs every 6 (six) hours as needed for wheezing or shortness of breath. 6.7 g 1   amitriptyline (ELAVIL) 75 MG tablet Take 1 tablet (75 mg total) by mouth at bedtime. 90 tablet 0   atorvastatin (LIPITOR) 80 MG tablet TAKE 1 TABLET BY MOUTH EVERY DAY (Patient taking differently: Take 80 mg by mouth at bedtime.) 90 tablet 3   azelastine (ASTELIN) 0.1 % nasal spray Place 2 sprays into both nostrils 2 (two) times daily. 30 mL 12   butalbital-acetaminophen-caffeine (FIORICET) 50-325-40 MG tablet Take 1 tablet by mouth every 6 (six) hours as needed for headache. 20 tablet 0   carvedilol (COREG) 12.5 MG tablet TAKE 1 TABLET BY MOUTH 2 TIMES DAILY 180 tablet 1   cetirizine (ZYRTEC) 10 MG tablet Take 10 mg by mouth daily as needed for allergies or rhinitis.     Continuous Glucose Receiver (FREESTYLE LIBRE 3 READER) DEVI Continuous glucose monitor 1 each 0   Continuous Glucose Sensor (FREESTYLE LIBRE 3 PLUS SENSOR) MISC 1 Device by Other route every 14 (fourteen) days. Change sensor every 15 days. 6 each 3   diclofenac Sodium (VOLTAREN) 1 % GEL Apply 2 g topically 4 (four) times daily. (Patient taking differently: Apply 2 g topically 4 (four) times daily as needed (pain).) 150 g 2   docusate sodium (COLACE) 100 MG capsule Take 2 capsules (200 mg total) by mouth daily. 180 capsule 3   ELIQUIS 5 MG TABS tablet TAKE 1 TABLET BY MOUTH 2 TIMES DAILY 180 tablet 3   febuxostat (ULORIC) 40 MG tablet TAKE 1 TABLET BY MOUTH EVERY DAY 90 tablet 3  fluticasone (FLONASE) 50 MCG/ACT nasal spray Place 2 sprays into both nostrils daily. 16 g 6   Fluticasone-Umeclidin-Vilant (TRELEGY ELLIPTA) 100-62.5-25 MCG/ACT AEPB Inhale 1 puff into the lungs daily. 1 each 11   folic acid (FOLVITE) 1 MG tablet TAKE 1 TABLET BY MOUTH EVERY DAY 90 tablet 1   glucagon 1 MG injection Inject 1 mg into the  vein once as needed for up to 1 dose. 1 each 12   glucose 4 GM chewable tablet Chew 1 tablet (4 g total) by mouth as needed for low blood sugar. 50 tablet 12   hydrOXYzine (ATARAX) 25 MG tablet TAKE 1 TABLET BY MOUTH EVERY 8 HOURS AS NEEDED FOR ANXIETY OR ITCHING (NASAL AND THROAT CONGESTION) 120 tablet 0   insulin aspart (NOVOLOG FLEXPEN) 100 UNIT/ML FlexPen Max daily 45 units 45 mL 4   insulin glargine, 2 Unit Dial, (TOUJEO MAX SOLOSTAR) 300 UNIT/ML Solostar Pen Inject 28 Units into the skin daily before breakfast. 15 mL 3   ipratropium-albuterol (DUONEB) 0.5-2.5 (3) MG/3ML SOLN Take 3 mLs by nebulization every 6 (six) hours as needed (cough, wheezing, or shortness of breath). 360 mL 0   linaclotide (LINZESS) 145 MCG CAPS capsule Patient take 1 capsule daily.  May add an additional capsule for breakthrough constipation associated with opioid use (Patient taking differently: Take 145 mcg by mouth every other day.) 180 capsule 1   meclizine (ANTIVERT) 25 MG tablet TAKE 1 TO 2 TABLETS BY MOUTH TWICE DAILY AS NEEDED FOR DIZZINESS 90 tablet 0   melatonin 5 MG TABS Take 1 tablet (5 mg total) by mouth at bedtime as needed. 15 tablet 0   memantine (NAMENDA) 5 MG tablet TAKE 2 TABLETS BY MOUTH EVERY MORNING and TAKE 1 TABLET BY MOUTH EVERY EVENING (Patient taking differently: Take 5-10 mg by mouth See admin instructions. Take 10 mg by mouth in the morning and 5 mg in the evening) 270 tablet 5   montelukast (SINGULAIR) 10 MG tablet Take 1 tablet (10 mg total) by mouth at bedtime. 30 tablet 3   nitroGLYCERIN (NITROSTAT) 0.4 MG SL tablet Take one tablet under the tongue every 5 minutes as needed for chest pain (Patient taking differently: Place 0.4 mg under the tongue every 5 (five) minutes as needed for chest pain.) 50 tablet 0   nystatin (MYCOSTATIN/NYSTOP) powder Apply 1 Application topically 3 (three) times daily. 60 g 3   ondansetron (ZOFRAN) 4 MG tablet Take 1 tablet (4 mg total) by mouth every 8 (eight)  hours as needed for nausea or vomiting. 20 tablet 0   pantoprazole (PROTONIX) 20 MG tablet TAKE 1 TABLET BY MOUTH EVERY DAY 30 tablet 5   Semaglutide,0.25 or 0.5MG /DOS, (OZEMPIC, 0.25 OR 0.5 MG/DOSE,) 2 MG/3ML SOPN Inject 0.5 mg into the skin once a week. 9 mL 3   torsemide (DEMADEX) 20 MG tablet TAKE 1 TABLET BY MOUTH ONCE DAILY MAY TAKE AN EXTRA DOES FOR WEIGHT GAIN OF 3LBs IN 1 DAY OR 5LBS IN 1 WEEK OR FOR WORSENING SWELLING 30 tablet 0   TYLENOL 500 MG tablet Take 1,000 mg by mouth every 6 (six) hours as needed for mild pain or headache.     TYLENOL PM EXTRA STRENGTH 500-25 MG TABS tablet Take 2 tablets by mouth at bedtime.     Vitamin D, Ergocalciferol, (DRISDOL) 1.25 MG (50000 UNIT) CAPS capsule TAKE 1 CAPSULE BY MOUTH ONCE WEEKLY 4 capsule 1   No current facility-administered medications for this visit.    REVIEW OF  SYSTEMS:    10 Point review of Systems was done is negative except as noted above.   PHYSICAL EXAMINATION:  ECOG PERFORMANCE STATUS:3  Vitals:   06/29/23 1255  BP: (!) 135/48  Pulse: 75  Resp: 16  Temp: 97.7 F (36.5 C)  SpO2: 100%   GENERAL:alert, in no acute distress and comfortable SKIN: no acute rashes, no significant lesions EYES: conjunctiva are pink and non-injected, sclera anicteric OROPHARYNX: MMM, no exudates, no oropharyngeal erythema or ulceration NECK: supple, no JVD LYMPH:  no palpable lymphadenopathy in the cervical, axillary or inguinal regions LUNGS: clear to auscultation b/l with normal respiratory effort HEART: regular rate & rhythm ABDOMEN:  normoactive bowel sounds , non tender, not distended. Extremity: no pedal edema PSYCH: alert & oriented x 3 with fluent speech NEURO: no focal motor/sensory deficits    LABORATORY DATA:  I have reviewed the data as listed  .    Latest Ref Rng & Units 06/29/2023   12:36 PM 06/22/2023   12:00 AM 06/07/2023    6:11 AM  CBC  WBC 4.0 - 10.5 K/uL 7.9  11.0     11.5   Hemoglobin 12.0 - 15.0 g/dL 8.3   8.2     8.1   Hematocrit 36.0 - 46.0 % 26.9  25     25.6   Platelets 150 - 400 K/uL 236  228     223      This result is from an external source.   . CBC    Component Value Date/Time   WBC 7.9 06/29/2023 1236   WBC 11.5 (H) 06/07/2023 0611   RBC 2.81 (L) 06/29/2023 1236   HGB 8.3 (L) 06/29/2023 1236   HGB 9.3 (L) 03/02/2020 1517   HGB 8.7 (L) 03/21/2017 1351   HCT 26.9 (L) 06/29/2023 1236   HCT 29.5 (L) 03/02/2020 1517   HCT 28.9 (L) 03/21/2017 1351   PLT 236 06/29/2023 1236   PLT 254 03/02/2020 1517   MCV 95.7 06/29/2023 1236   MCV 88 03/02/2020 1517   MCV 84.3 03/21/2017 1351   MCH 29.5 06/29/2023 1236   MCHC 30.9 06/29/2023 1236   RDW 14.0 06/29/2023 1236   RDW 13.1 03/02/2020 1517   RDW 15.5 (H) 03/21/2017 1351   LYMPHSABS 2.0 06/29/2023 1236   LYMPHSABS 2.4 03/21/2017 1351   MONOABS 0.4 06/29/2023 1236   MONOABS 0.7 03/21/2017 1351   EOSABS 0.2 06/29/2023 1236   EOSABS 0.2 03/21/2017 1351   BASOSABS 0.0 06/29/2023 1236   BASOSABS 0.0 03/21/2017 1351   .    Latest Ref Rng & Units 06/29/2023   12:36 PM 06/22/2023   12:00 AM 06/08/2023    7:05 AM  CMP  Glucose 70 - 99 mg/dL 387   564   BUN 8 - 23 mg/dL 76  71     69   Creatinine 0.44 - 1.00 mg/dL 3.32  2.3     9.51   Sodium 135 - 145 mmol/L 136  139     138   Potassium 3.5 - 5.1 mmol/L 4.4  5.0     4.8   Chloride 98 - 111 mmol/L 107  109     108   CO2 22 - 32 mmol/L 22  21     21    Calcium 8.9 - 10.3 mg/dL 8.3  8.2     8.2   Total Protein 6.5 - 8.1 g/dL 6.6     Total Bilirubin 0.0 - 1.2 mg/dL 0.2  Alkaline Phos 38 - 126 U/L 106     AST 15 - 41 U/L 13     ALT 0 - 44 U/L 10        This result is from an external source.    . Lab Results  Component Value Date   IRON 44 06/29/2023   TIBC 181 (L) 06/29/2023   IRONPCTSAT 24 06/29/2023   (Iron and TIBC)  Lab Results  Component Value Date   FERRITIN 171 06/29/2023       RADIOGRAPHIC STUDIES: I have personally reviewed the radiological images  as listed and agreed with the findings in the report.  CT ABDOMEN PELVIS WO CONTRAST Result Date: 06/04/2023 CLINICAL DATA:  Acute abdominal pain EXAM: CT ABDOMEN AND PELVIS WITHOUT CONTRAST TECHNIQUE: Multidetector CT imaging of the abdomen and pelvis was performed following the standard protocol without IV contrast. RADIATION DOSE REDUCTION: This exam was performed according to the departmental dose-optimization program which includes automated exposure control, adjustment of the mA and/or kV according to patient size and/or use of iterative reconstruction technique. COMPARISON:  CT abdomen and pelvis 12/26/2022 FINDINGS: Lower chest: No acute abnormality. Hepatobiliary: Gallstones are present. There is no biliary ductal dilatation. The liver is within normal limits. Pancreas: Unremarkable. No pancreatic ductal dilatation or surrounding inflammatory changes. Spleen: Normal in size without focal abnormality. Adrenals/Urinary Tract: Bilateral renal cysts are present measuring up to 3.5 cm in the right kidney and 2.3 cm in the left kidney. There is no hydronephrosis or urinary tract calculus. The adrenal glands and bladder are within normal limits. Stomach/Bowel: No evidence of bowel wall thickening, distention, or inflammatory changes. Small bowel anastomosis is noted in the left abdomen. The appendix is not visualized. There surgical changes of the gastroesophageal junction. There is a small hiatal hernia. Vascular/Lymphatic: Aortic atherosclerosis. No enlarged abdominal or pelvic lymph nodes. Reproductive: The uterus is mildly enlarged and heterogeneous with punctate calcifications likely related to fibroid change. The adnexa are within normal limits. Other: No abdominal wall hernia or abnormality. No abdominopelvic ascites. There sutures along the anterior abdominal wall. Musculoskeletal: There severe degenerative changes of the spine. IMPRESSION: 1. No acute localizing process in the abdomen or pelvis. 2.  Cholelithiasis. 3. Small hiatal hernia. 4. Fibroid uterus. 5. Aortic atherosclerosis. Aortic Atherosclerosis (ICD10-I70.0). Electronically Signed   By: Darliss Cheney M.D.   On: 06/04/2023 23:39   CT Cervical Spine Wo Contrast Result Date: 06/04/2023 CLINICAL DATA:  Neck trauma.  Fall 6 days ago with head injury. EXAM: CT CERVICAL SPINE WITHOUT CONTRAST TECHNIQUE: Multidetector CT imaging of the cervical spine was performed without intravenous contrast. Multiplanar CT image reconstructions were also generated. RADIATION DOSE REDUCTION: This exam was performed according to the departmental dose-optimization program which includes automated exposure control, adjustment of the mA and/or kV according to patient size and/or use of iterative reconstruction technique. COMPARISON:  Cervical spine radiographs 02/02/2020. FINDINGS: Alignment: Normal. Skull base and vertebrae: No evidence of acute fracture or traumatic subluxation. Soft tissues and spinal canal: No prevertebral fluid or swelling. No visible canal hematoma. Disc levels: The cervical disc heights are relatively maintained. There is mild multilevel spondylosis with disc bulging, uncinate spurring and facet hypertrophy. No large disc herniation or high-grade spinal stenosis demonstrated. Upper chest: Clear lung apices. Other: Bilateral carotid atherosclerosis. IMPRESSION: 1. No evidence of acute cervical spine fracture, traumatic subluxation or static signs of instability. 2. Mild multilevel cervical spondylosis. Electronically Signed   By: Carey Bullocks M.D.   On: 06/04/2023 14:49  CT Head Wo Contrast Result Date: 06/04/2023 CLINICAL DATA:  Head trauma, minor (Age >= 65y) EXAM: CT HEAD WITHOUT CONTRAST TECHNIQUE: Contiguous axial images were obtained from the base of the skull through the vertex without intravenous contrast. RADIATION DOSE REDUCTION: This exam was performed according to the departmental dose-optimization program which includes automated  exposure control, adjustment of the mA and/or kV according to patient size and/or use of iterative reconstruction technique. COMPARISON:  09/12/2022 FINDINGS: Brain: No evidence of acute infarction, hemorrhage, hydrocephalus, extra-axial collection or mass lesion/mass effect. Patchy low-density changes within the periventricular and subcortical white matter most compatible with chronic microvascular ischemic change. Mild diffuse cerebral volume loss. Vascular: Atherosclerotic calcifications involving the large vessels of the skull base. No unexpected hyperdense vessel. Skull: Normal. Negative for fracture or focal lesion. Sinuses/Orbits: Mucosal thickening of the right frontal sinus and within the anterior right ethmoid air cells. Otherwise clear. Other: Negative for scalp hematoma. IMPRESSION: 1. No acute intracranial findings. 2. Chronic microvascular ischemic change and cerebral volume loss. Electronically Signed   By: Duanne Guess D.O.   On: 06/04/2023 14:48   Diagnostic mammogram b/l 11/01/17  IMPRESSION: No significant change in suspicious microcalcifications posterior to the lumpectomy site in the left breast compared to the mammogram of 2018. No suspicious findings in the right breast. Exclusion of some posterior tissue due to patient decreased mobility.   RECOMMENDATION: Diagnostic mammogram is suggested in 1 year. (Code:[id number]-01Y)  CLINICAL DATA:  Annual mammography. The patient had surgery for DCIS in the left breast in 2016. There were positive margins at surgery but the patient could not return for additional surgery. Known suspicious calcifications remain. EXAM: 2D DIGITAL DIAGNOSTIC BILATERAL MAMMOGRAM WITH CAD AND ADJUNCT TOMO COMPARISON:  Previous exam(s). ACR Breast Density Category b: There are scattered areas of fibroglandular density. FINDINGS: The suspicious calcifications posterior to the left lumpectomy site are stable. No other interval changes or other suspicious  findings. Mammographic images were processed with CAD. IMPRESSION: Continued suspicious calcifications posterior to the left lumpectomy site. No other changes. RECOMMENDATION: Continued annual mammography for surveillance. Continued oncologic follow up. I have discussed the findings and recommendations with the patient. Results were also provided in writing at the conclusion of the visit. If applicable, a reminder letter will be sent to the patient regarding the next appointment. BI-RADS CATEGORY  6: Known biopsy-proven malignancy.     Electronically Signed   By: Gerome Sam III M.D   On: 08/16/2016 15:21    ASSESSMENT & PLAN:   Mrs. Arrick is a very wonderful 89 y.o. African-American female with multiple medical comorbidities as described above with   #1 Left-sided presumed stage IA  (pT1c,pNx[cN0], cM0) invasive ductal carcinoma grade 2 out of 3, strongly ER +100%, strongly PR +100% and HER-2/neu negative. Given her high risk cardiac status lumpectomy had to be done under local anesthesia and sentinel lymph node biopsy was not possible. She has accompanying DCIS of intermediate grade present at margins. ECOG performance status is 3. Dexa scan "low bone mass" per WHO. -Bone density scan on 03/12/2017 with results showing: T-score of 0.5 at left forearm. Lumbar spine and dual femurs not completed due to patient in a wheelchair and not able to lay flat due to SOB while laying flat.   #2 significant coronary artery disease with stress test in February 2016 suggesting likely multivessel disease. Has uncontrolled diabetes, hypertension, dyslipidemia, untreated sleep apnea, coronary disease, severe arthritis all of which are significantly limiting her quality of life.  #3  vitamin D deficiency - 25OH vitamin D level of 10, s/p high dose ergocalciferol re-placement with 25OH Vit D improvement to 30.9 in 10/2017. Now on vit D 2000 units daily.   #4  Anemia- anemia of chronic disease from  CKD + ? Slow GI losses . Multiple metabolic insults in the bone marrow including uncontrolled diabetes  Now with more reapid drop due to concerns for active GI melena and hematuria.  PLAN:  -Discussed lab results on 06/29/23 in detail with patient. CBC showed WBC of 7.9K, hemoglobin low at 8.3, and platelets of 236K. -Patient continues to be off Arimidex based on her choice  -no clinical sign or lab evidence of breast cancer progression at this time -Iron labs from 2/14-2025 with nephrologist shows iron saturation of 24% and ferritin of 240  -iron labs from today are pending at this time -discussed that with CKD, there is a goal to keep ferritin closer to 500 -educated patient on reasons for persistent anemia despite decent iron levels, including genetic changes in the bone marrow with age, fluctuating blood sugars, and lack of erythropoietin  -discussed that if her hemoglobin drops to mid-7, there may be a role for blood transfusion -discussed option of starting hormone shots, either Retacrit or Aranesp, in additon to IV iron with hgb goal of 9-10 -discussed low risks with hormonal shot including increased risk of blood clots or stressing the heart. Discussed that hormonal injections may improve her dizziness.  -patient is agreeable to receiving a hormone injection shot once a month  -will proceed with IV iron  -discussed that if hormonal shots do not fix her anemia, there may be a role for bone marrow biopsy for further evaluation -patient would not like to proceed with a bone marrow biopsy at this time -educated patient that there can be an increased risk of falls with increased age due to a combination of factors, including loss of muscle mass with less movement, plenty of arthritis in the joints, neuropathy from DM causing balance issues, muscle weakness in legs, and pinched nerves in the back -CT abdomen/pelvis scan from 06/04/2023 showed severe degenerative changes of the spine from  arthritis -discussed that her back pain is more likely related to arthritis and not breast cancer -advised patient to connect with PCP for chronic arthritis pain management and to consider scanning the lower back or placing referral to orthopedics -will order a low-dose of tramadol, along with nausea medication, which she shall continue to have refilled by her PCP  FOLLOW-UP: Monthly Retacrit and labs starting in 1st week of April 2025. RTC with Dr Candise Che with labs in about 4 months  The total time spent in the appointment was 30 minutes* .  All of the patient's questions were answered with apparent satisfaction. The patient knows to call the clinic with any problems, questions or concerns.   Wyvonnia Lora MD MS AAHIVMS [facility] Hematology/Oncology Physician [facility]  .*Total Encounter Time as defined by the Centers for Medicare and Medicaid Services includes, in addition to the face-to-face time of a patient visit (documented in the note above) non-face-to-face time: obtaining and reviewing outside history, ordering and reviewing medications, tests or procedures, care coordination (communications with other health care professionals or caregivers) and documentation in the medical record.    I,Mitra Faeizi,acting as a Neurosurgeon for Wyvonnia Lora, MD.,have documented all relevant documentation on the behalf of Wyvonnia Lora, MD,as directed by  Wyvonnia Lora, MD while in the presence of Wyvonnia Lora, MD.  .  I have reviewed the above documentation for accuracy and completeness, and I agree with the above. Nichole Maine MD

## 2023-06-30 ENCOUNTER — Telehealth: Payer: Self-pay | Admitting: Hematology

## 2023-06-30 NOTE — Telephone Encounter (Signed)
 Spoke with patient confirming upcoming appointment

## 2023-07-02 ENCOUNTER — Other Ambulatory Visit (HOSPITAL_COMMUNITY): Payer: Self-pay | Admitting: *Deleted

## 2023-07-03 ENCOUNTER — Ambulatory Visit (HOSPITAL_COMMUNITY)
Admission: RE | Admit: 2023-07-03 | Discharge: 2023-07-03 | Disposition: A | Discharge status: Home / Self Care | Payer: PPO | Source: Ambulatory Visit | Attending: Nephrology | Admitting: Nephrology

## 2023-07-03 DIAGNOSIS — D631 Anemia in chronic kidney disease: Secondary | ICD-10-CM | POA: Diagnosis not present

## 2023-07-03 DIAGNOSIS — N189 Chronic kidney disease, unspecified: Secondary | ICD-10-CM | POA: Insufficient documentation

## 2023-07-03 MED ORDER — IRON SUCROSE 300 MG IVPB - SIMPLE MED
300.0000 mg | Freq: Once | Status: AC
Start: 2023-07-03 — End: 2023-07-03
  Administered 2023-07-03: 300 mg via INTRAVENOUS
  Filled 2023-07-03: qty 300

## 2023-07-04 ENCOUNTER — Telehealth: Payer: Self-pay

## 2023-07-04 DIAGNOSIS — Z741 Need for assistance with personal care: Secondary | ICD-10-CM

## 2023-07-04 DIAGNOSIS — Z7409 Other reduced mobility: Secondary | ICD-10-CM

## 2023-07-04 DIAGNOSIS — J4489 Other specified chronic obstructive pulmonary disease: Secondary | ICD-10-CM

## 2023-07-04 DIAGNOSIS — I5032 Chronic diastolic (congestive) heart failure: Secondary | ICD-10-CM

## 2023-07-04 NOTE — Telephone Encounter (Signed)
 Copied from CRM [id number]. Topic: General - Other >> Jul 04, 2023  1:41 PM [place] A wrote: Reason for CRM: Patient called and said that she needs a new order for Skilled Nursing and Home Health Aide while she is in [place], [place] her daughter for a month  Forwarding message above; Skilled nursing and Home Health Aide request.

## 2023-07-05 ENCOUNTER — Ambulatory Visit: Payer: Self-pay

## 2023-07-05 ENCOUNTER — Telehealth: Payer: Self-pay

## 2023-07-05 DIAGNOSIS — H903 Sensorineural hearing loss, bilateral: Secondary | ICD-10-CM | POA: Diagnosis not present

## 2023-07-05 NOTE — Patient Outreach (Signed)
 Care Coordination   Follow Up Visit Note   07/05/2023 Name: [name] MRN: [id number] DOB: 05-29-1934  [name] is a 89 y.o. year old female who sees [name], MD for primary care. I  spoke with daughter Elita Quick  What matters to the patients health and wellness today?  Maintain health    Goals Addressed             This Visit's Progress    Diabetes Management       Patient Goals/Self Care Activities: -Patient/Caregiver will take medications as prescribed   -Patient/Caregiver will attend all scheduled provider appointments -Patient/Caregiver will call provider office for new concerns or questions   -Calls provider office for new concerns, questions, or BP outside discussed parameters -Follows a low sodium diet/DASH diet -check blood sugar at prescribed times -check blood sugar if I feel it is too high or too low -record values and write them down take them to all doctor visits    Spoke with daughter Elita Quick. She reports patient doing much better.  She is working with therapy.  Advised to continue to motivate her as there is some improvement.  Blood sugars are doing okay except for some drops overnight if she does not get her snack,  Advised on continuing snack at night.  She verbalized understanding.  She denies any problems with blood pressure.  Encouraged continue HTN Management as well. No concerns.           SDOH assessments and interventions completed:  Yes     Care Coordination Interventions:  Yes, provided   Follow up plan: Follow up call scheduled for March    Encounter Outcome:  Patient Visit Completed   Bary Leriche RN, MSN [facility], [facility] RN Care Manager Direct Dial: [phone]  Fax: [phone] Website: [email or web address]

## 2023-07-05 NOTE — Patient Instructions (Signed)
 Visit Information  Thank you for taking time to visit with me today. Please don't hesitate to contact me if I can be of assistance to you.   Following are the goals we discussed today:   Goals Addressed             This Visit's Progress    Diabetes Management       Patient Goals/Self Care Activities: -Patient/Caregiver will take medications as prescribed   -Patient/Caregiver will attend all scheduled provider appointments -Patient/Caregiver will call provider office for new concerns or questions   -Calls provider office for new concerns, questions, or BP outside discussed parameters -Follows a low sodium diet/DASH diet -check blood sugar at prescribed times -check blood sugar if I feel it is too high or too low -record values and write them down take them to all doctor visits    Spoke with daughter Elita Quick. She reports patient doing much better.  She is working with therapy.  Advised to continue to motivate her as there is some improvement.  Blood sugars are doing okay except for some drops overnight if she does not get her snack,  Advised on continuing snack at night.  She verbalized understanding.  She denies any problems with blood pressure.  Encouraged continue HTN Management as well. No concerns.           Our next appointment is by telephone on 08/02/23 at 1030 am  Please call the care guide team at [phone] if you need to cancel or reschedule your appointment.   If you are experiencing a Mental Health or Behavioral Health Crisis or need someone to talk to, please call the Suicide and Crisis Lifeline: 988   Patient verbalizes understanding of instructions and care plan provided today and agrees to view in MyChart. Active MyChart status and patient understanding of how to access instructions and care plan via MyChart confirmed with patient.     The patient has been provided with contact information for the care management team and has been advised to call with any health  related questions or concerns.   Bary Leriche RN, MSN [facility], [facility] Health RN Care Manager Direct Dial: [phone]  Fax: [phone] Website: [email or web address]

## 2023-07-05 NOTE — Addendum Note (Signed)
 Addended by: Fanny Bien B on: 07/05/2023 01:10 PM   Modules accepted: Orders

## 2023-07-05 NOTE — Patient Outreach (Signed)
 Care Coordination   07/05/2023 Name: [name] MRN: [id number] DOB: 10-18-1934   Care Coordination Outreach Attempts:  An unsuccessful outreach was attempted for an appointment today.  Follow Up Plan:  Additional outreach attempts will be made to offer the patient complex care management information and services.   Encounter Outcome:  No Answer   Care Coordination Interventions:  No, not indicated     Bary Leriche RN, MSN [facility]  [facility], [facility] Health RN Care Manager Direct Dial: [phone]  Fax: [phone] Website: [email or web address]

## 2023-07-06 ENCOUNTER — Other Ambulatory Visit: Payer: Self-pay | Admitting: Family Medicine

## 2023-07-06 DIAGNOSIS — I5032 Chronic diastolic (congestive) heart failure: Secondary | ICD-10-CM

## 2023-07-06 NOTE — Telephone Encounter (Signed)
 Copied from CRM [id number]. Topic: Clinical - Medical Advice >> Jul 06, 2023 11:05 AM Orinda Kenner C wrote: Reason for CRM: Patient wants to speak with Patsy Lager on being discharge to going to [facility] home health. Patient doesn't want Dr. Janee Morn to change the order, leave the order with [facility] home health. Please call back at [phone].

## 2023-07-06 NOTE — Telephone Encounter (Signed)
 Disregard previous message. Patient will stay with [facility] home health.

## 2023-07-06 NOTE — Telephone Encounter (Signed)
 Left patient a detailed voice message with annotation below

## 2023-07-08 ENCOUNTER — Encounter: Payer: Self-pay | Admitting: Hematology

## 2023-07-08 ENCOUNTER — Encounter (HOSPITAL_COMMUNITY): Payer: Self-pay

## 2023-07-15 ENCOUNTER — Other Ambulatory Visit: Payer: Self-pay | Admitting: Internal Medicine

## 2023-07-20 ENCOUNTER — Ambulatory Visit: Payer: Self-pay | Admitting: Family Medicine

## 2023-07-20 ENCOUNTER — Telehealth: Payer: Self-pay

## 2023-07-20 ENCOUNTER — Other Ambulatory Visit: Payer: Self-pay | Admitting: Family Medicine

## 2023-07-20 DIAGNOSIS — R42 Dizziness and giddiness: Secondary | ICD-10-CM

## 2023-07-20 DIAGNOSIS — R519 Headache, unspecified: Secondary | ICD-10-CM

## 2023-07-20 MED ORDER — MECLIZINE HCL 25 MG PO TABS
25.0000 mg | ORAL_TABLET | Freq: Two times a day (BID) | ORAL | 1 refills | Status: DC
Start: 2023-07-20 — End: 2023-12-06

## 2023-07-20 NOTE — Telephone Encounter (Signed)
 Pt called regarding RX refills needed due to dizziness. RX was sent and Pt was called and notified; she verbalized understanding

## 2023-07-20 NOTE — Telephone Encounter (Signed)
 I tried to call patient to notifiy her of Rx refill and no answer. I will send patient a Mychart message

## 2023-07-20 NOTE — Telephone Encounter (Signed)
  Chief Complaint: medication refill, dizziness Symptoms: headache, vertigo Frequency: ongoing problem for over a month, worse today as she ran out of her medication today Pertinent Negatives: Patient denies unilateral weakness or numbness, changes in speech or vision, new chest pain or SOB. Disposition: [] ED /[] Urgent Care (no appt availability in office) / [] Appointment(In office/virtual)/ []  [facility]/ [] Home Care/ [] Refused Recommended Disposition /[] [facility]/ [x]  Follow-up with PCP Additional Notes: Patient was prescribed meclizine 06/05/23 and states she has run out today. She states sometimes she has to take 2 tablets at a time, but it helps with symptoms. She states she has been having to take the meclizine every day. She states she is out of town with daughter, requesting it be sent to [facility] on [address], [place]. Called [facility] and spoke with Morrie Sheldon in the office, she states she will get this message to Dr Janee Morn right away. Could not hear patient after resuming call. Attempted to call patient back at the mobile phone # she is on and message states "call cannot be completed as dialed".  Copied from CRM [id number]. Topic: Clinical - Red Word Triage >> Jul 20, 2023  3:40 PM Theodis Sato wrote: Red Word that prompted transfer to Nurse Triage: Dizzy and out of her medication Reason for Disposition  SEVERE dizziness (vertigo) (e.g., unable to walk without assistance)  Answer Assessment - Initial Assessment Questions 1. DESCRIPTION: "Describe your dizziness."     She states she can't stand up right, feels dizzy and has a headache.  2. VERTIGO: "Do you feel like either you or the room is spinning or tilting?"      Yes.  3. LIGHTHEADED: "Do you feel lightheaded?" (e.g., somewhat faint, woozy, weak upon standing)     Denies.  4. SEVERITY: "How bad is it?"  "Can you walk?"   - MILD: Feels slightly dizzy and unsteady, but is walking normally.   - MODERATE: Feels  unsteady when walking, but not falling; interferes with normal activities (e.g., school, work).   - SEVERE: Unable to walk without falling, or requires assistance to walk without falling.     She states she uses a wheelchair, she can't walk at baseline. She states right now it is severe because she has not been able to take the medicine.  5. ONSET:  "When did the dizziness begin?"     More than a month.  6. AGGRAVATING FACTORS: "Does anything make it worse?" (e.g., standing, change in head position)     Worse when trying to stand up.  7. CAUSE: "What do you think is causing the dizziness?"     Unsure.  8. RECURRENT SYMPTOM: "Have you had dizziness before?" If Yes, ask: "When was the last time?" "What happened that time?"     She states she normally gets "a dizzy headache" with her vertigo and she states this feels like her vertigo now.  9. OTHER SYMPTOMS: "Do you have any other symptoms?" (e.g., headache, weakness, numbness, vomiting, earache)     9/10 headache x today (she states she took Tylenol this morning).  10. PREGNANCY: "Is there any chance you are pregnant?" "When was your last menstrual period?"       N/A.  Protocols used: Dizziness - Vertigo-A-AH

## 2023-07-20 NOTE — Telephone Encounter (Signed)
 Copied from CRM [id number]. Topic: Clinical - Medication Refill >> Jul 20, 2023  3:46 PM Theodis Sato wrote: Most Recent Primary Care Visit:  Provider: [name]  Department: [facility]  Visit Type: HOSPITAL FOLLOW UP  Date: 06/19/2023  Medication: meclizine (ANTIVERT) 25 MG tablet  Has the patient contacted their pharmacy? Yes (Agent: If no, request that the patient contact the pharmacy for the refill. If patient does not wish to contact the pharmacy document the reason why and proceed with request.) (Agent: If yes, when and what did the pharmacy advise?)  Is this the correct pharmacy for this prescription? Yes If no, delete pharmacy and type the correct one.     [facility] [address] [place], [place] - [address] [place] [address] Phone: [phone] Fax: [phone]   Has the prescription been filled recently? Yes  Is the patient out of the medication? Yes  Has the patient been seen for an appointment in the last year OR does the patient have an upcoming appointment? Yes  Can we respond through MyChart? Yes  Agent: Please be advised that Rx refills may take up to 3 business days. We ask that you follow-up with your pharmacy.

## 2023-07-23 NOTE — Telephone Encounter (Signed)
 I called patient and left voicemail for patient to return call.   I called patient on Friday in refill encounter.

## 2023-07-26 DIAGNOSIS — J449 Chronic obstructive pulmonary disease, unspecified: Secondary | ICD-10-CM | POA: Diagnosis not present

## 2023-08-02 ENCOUNTER — Ambulatory Visit: Payer: Self-pay

## 2023-08-02 NOTE — Patient Outreach (Signed)
 Care Coordination   08/02/2023 Name: [name] MRN: [id number] DOB: 07/09/1934   Care Coordination Outreach Attempts:  An unsuccessful outreach was attempted for an appointment today.  Follow Up Plan:  Additional outreach attempts will be made to offer the patient complex care management information and services.   Encounter Outcome:  No Answer   Care Coordination Interventions:  No, not indicated     Bary Leriche RN, MSN [facility]  [facility], [facility] Health RN Care Manager Direct Dial: [phone]  Fax: [phone] Website: [email or web address]

## 2023-08-07 ENCOUNTER — Telehealth: Payer: Self-pay

## 2023-08-09 ENCOUNTER — Other Ambulatory Visit: Payer: Self-pay | Admitting: Family Medicine

## 2023-08-09 ENCOUNTER — Other Ambulatory Visit: Payer: Self-pay | Admitting: Hematology

## 2023-08-09 ENCOUNTER — Other Ambulatory Visit: Payer: Self-pay

## 2023-08-09 DIAGNOSIS — I5032 Chronic diastolic (congestive) heart failure: Secondary | ICD-10-CM

## 2023-08-09 DIAGNOSIS — D631 Anemia in chronic kidney disease: Secondary | ICD-10-CM

## 2023-08-09 DIAGNOSIS — D509 Iron deficiency anemia, unspecified: Secondary | ICD-10-CM

## 2023-08-10 ENCOUNTER — Inpatient Hospital Stay: Payer: PPO | Attending: Internal Medicine

## 2023-08-10 ENCOUNTER — Other Ambulatory Visit: Payer: Self-pay | Admitting: Hematology

## 2023-08-10 ENCOUNTER — Inpatient Hospital Stay: Payer: PPO

## 2023-08-10 VITALS — BP 130/50 | HR 66 | Temp 98.5°F | Resp 17

## 2023-08-10 DIAGNOSIS — D509 Iron deficiency anemia, unspecified: Secondary | ICD-10-CM

## 2023-08-10 DIAGNOSIS — Z79899 Other long term (current) drug therapy: Secondary | ICD-10-CM | POA: Insufficient documentation

## 2023-08-10 DIAGNOSIS — C50912 Malignant neoplasm of unspecified site of left female breast: Secondary | ICD-10-CM | POA: Diagnosis not present

## 2023-08-10 DIAGNOSIS — D631 Anemia in chronic kidney disease: Secondary | ICD-10-CM | POA: Diagnosis not present

## 2023-08-10 DIAGNOSIS — N183 Chronic kidney disease, stage 3 unspecified: Secondary | ICD-10-CM | POA: Insufficient documentation

## 2023-08-10 DIAGNOSIS — Z79811 Long term (current) use of aromatase inhibitors: Secondary | ICD-10-CM | POA: Diagnosis not present

## 2023-08-10 DIAGNOSIS — E1122 Type 2 diabetes mellitus with diabetic chronic kidney disease: Secondary | ICD-10-CM | POA: Insufficient documentation

## 2023-08-10 DIAGNOSIS — E1140 Type 2 diabetes mellitus with diabetic neuropathy, unspecified: Secondary | ICD-10-CM | POA: Insufficient documentation

## 2023-08-10 DIAGNOSIS — Z170 Estrogen receptor positive status [ER+]: Secondary | ICD-10-CM | POA: Diagnosis not present

## 2023-08-10 LAB — CMP (CANCER CENTER ONLY)
ALT: 8 U/L (ref 0–44)
AST: 11 U/L — ABNORMAL LOW (ref 15–41)
Albumin: 3.4 g/dL — ABNORMAL LOW (ref 3.5–5.0)
Alkaline Phosphatase: 98 U/L (ref 38–126)
Anion gap: 4 — ABNORMAL LOW (ref 5–15)
BUN: 63 mg/dL — ABNORMAL HIGH (ref 8–23)
CO2: 27 mmol/L (ref 22–32)
Calcium: 8.4 mg/dL — ABNORMAL LOW (ref 8.9–10.3)
Chloride: 105 mmol/L (ref 98–111)
Creatinine: 2.4 mg/dL — ABNORMAL HIGH (ref 0.44–1.00)
GFR, Estimated: 19 mL/min — ABNORMAL LOW (ref >60)
Glucose, Bld: 120 mg/dL — ABNORMAL HIGH (ref 70–99)
Potassium: 5.1 mmol/L (ref 3.5–5.1)
Sodium: 136 mmol/L (ref 135–145)
Total Bilirubin: 0.3 mg/dL (ref 0.0–1.2)
Total Protein: 7.2 g/dL (ref 6.5–8.1)

## 2023-08-10 LAB — CBC WITH DIFFERENTIAL (CANCER CENTER ONLY)
Abs Immature Granulocytes: 0.02 10*3/uL (ref 0.00–0.07)
Basophils Absolute: 0 10*3/uL (ref 0.0–0.1)
Basophils Relative: 0 %
Eosinophils Absolute: 0.2 10*3/uL (ref 0.0–0.5)
Eosinophils Relative: 3 %
HCT: 27.6 % — ABNORMAL LOW (ref 36.0–46.0)
Hemoglobin: 8.6 g/dL — ABNORMAL LOW (ref 12.0–15.0)
Immature Granulocytes: 0 %
Lymphocytes Relative: 31 %
Lymphs Abs: 2.6 10*3/uL (ref 0.7–4.0)
MCH: 29.6 pg (ref 26.0–34.0)
MCHC: 31.2 g/dL (ref 30.0–36.0)
MCV: 94.8 fL (ref 80.0–100.0)
Monocytes Absolute: 0.5 10*3/uL (ref 0.1–1.0)
Monocytes Relative: 6 %
Neutro Abs: 5.1 10*3/uL (ref 1.7–7.7)
Neutrophils Relative %: 60 %
Platelet Count: 244 10*3/uL (ref 150–400)
RBC: 2.91 MIL/uL — ABNORMAL LOW (ref 3.87–5.11)
RDW: 13.9 % (ref 11.5–15.5)
WBC Count: 8.5 10*3/uL (ref 4.0–10.5)
nRBC: 0 % (ref 0.0–0.2)

## 2023-08-10 LAB — IRON AND IRON BINDING CAPACITY (CC-WL,HP ONLY)
Iron: 48 ug/dL (ref 28–170)
Saturation Ratios: 23 % (ref 10.4–31.8)
TIBC: 207 ug/dL — ABNORMAL LOW (ref 250–450)
UIBC: 159 ug/dL (ref 148–442)

## 2023-08-10 LAB — FERRITIN: Ferritin: 217 ng/mL (ref 11–307)

## 2023-08-10 MED ORDER — EPOETIN ALFA-EPBX 10000 UNIT/ML IJ SOLN
20000.0000 [IU] | Freq: Once | INTRAMUSCULAR | Status: AC
  Administered 2023-08-10: 20000 [IU] via SUBCUTANEOUS
  Filled 2023-08-10: qty 2

## 2023-08-10 NOTE — Progress Notes (Signed)
 Complex Care Management Note  Care Guide Note 08/10/2023 Name: [name] MRN: [id number] DOB: 01-19-1935  [name] is a 89 y.o. year old female who sees [name], MD for primary care. I reached out to [name] by phone today to offer complex care management services.  [name] was given information about Complex Care Management services today including:   The Complex Care Management services include support from the care team which includes your Nurse Care Manager, Clinical Social Worker, or Pharmacist.  The Complex Care Management team is here to help remove barriers to the health concerns and goals most important to you. Complex Care Management services are voluntary, and the patient may decline or stop services at any time by request to their care team member.   Complex Care Management Consent Status: Patient agreed to services and verbal consent obtained.   Follow up plan:  Telephone appointment with complex care management team member scheduled for:  08/17/23 at 3:00 p.m.   Encounter Outcome:  Patient Scheduled  [facility]  [facility], [facility] Care Management Assistant Direct Dial: [phone]  Fax: [phone]

## 2023-08-17 ENCOUNTER — Other Ambulatory Visit: Payer: Self-pay

## 2023-08-17 NOTE — Patient Outreach (Signed)
 Complex Care Management   Visit Note  08/17/2023  Name:  [name] MRN: [id number] DOB: 11-02-1934  Situation: Referral received for Complex Care Management related to Diabetes with Complications and Chronic Kidney Disease I obtained verbal consent from Patient.  Visit completed with patient and daughter   on the phone  Background:   Past Medical History:  Diagnosis Date   Abdominal pain 01/26/2012   Acute deep vein thrombosis (DVT) of femoral vein of right lower extremity (HCC) 03/26/2022   Acute kidney injury superimposed on chronic kidney disease (HCC) 12/18/2014   Acute upper GI bleed 03/26/2022   Allergy    takes Mucinex daily as needed   Anemia, unspecified    Anxiety    takes Clonazepam daily as needed   Anxiety associated with depression 06/08/2017   Arthritis    Breast cancer (HCC)    Breast cancer screening 02/19/2014   Breast mass 10/27/2014   Chronic diastolic CHF (congestive heart failure) (HCC)    takes Furosemide daily   Chronic pain syndrome 02/06/2015   CKD (chronic kidney disease), stage III (HCC)    Coronary artery disease    a. s/p IMI 2004 tx with BMS to RCA;  b. s/p Promus DES to RCA 2/12 c. abnormal nuc 2016 -> cath with med rx. d. NSTEMI 02/2020  mv CAD with severe ISR in pRCA and m/dRCA lesion both treated with DES.   Debility 08/01/2012   Dementia (HCC)    Depression    takes Cymbalta daily   Diabetes mellitus    insulin daily   DOE (dyspnea on exertion) 06/24/2014   Dysphagia, unspecified(787.20) 07/31/2012   Fungal dermatitis 11/13/2007   Qualifier: Diagnosis of  By: Amador Cunas  MD, Janett Labella    GERD (gastroesophageal reflux disease)    takes Protonix daily   Gout, unspecified    Headache    occasionally   History of anemia due to chronic kidney disease 03/26/2022   History of blood clots    History of deep venous thrombosis 01/27/2012   History of pulmonary embolus (PE) 04/22/2014   Hyperkalemia 07/26/2017   Hyperlipidemia    takes  Pravastatin daily   Hypertension    Insomnia    Joint pain    Joint swelling    Morbid obesity (HCC)    Multiple benign nevi 11/15/2016   Myocardial infarction (HCC) 2004   Non-STEMI (non-ST elevated myocardial infarction) (HCC) 02/15/2020   Numbness    Obstructive sleep apnea    does not wear cpap   Osteoarthritis    Osteoarthritis    Osteoarthrosis, unspecified whether generalized or localized, lower leg    Pain in the chest 12/31/2013   Pain, chronic    Personal history of other diseases of the digestive system 12/03/2006   Qualifier: Diagnosis of  By: Amador Cunas  MD, Janett Labella    Polymyalgia rheumatica [facility])    Presence of stent in right coronary artery    Pulmonary emboli (HCC) 01/2012   felt to need lifelong anticoagulation but Xarelto dc in 02/2020 due to need for DAPT   Situational depression 06/04/2009   Qualifier: Diagnosis of  By: Amador Cunas  MD, Janett Labella    Syncope, vasovagal 07/04/2021   Type 2 diabetes mellitus with hyperlipidemia (HCC) 02/16/2022   Urinary incontinence    takes Linzess daily   Vaginitis and vulvovaginitis 06/23/2016   Vertigo    hx of;was taking Meclizine if needed   Wears dentures     Assessment: Patient Reported Symptoms:  Cognitive Cognitive Status: Alert and oriented to person, place, and time      Neurological      HEENT HEENT Symptoms Reported: Nasal discharge, Runny nose, Change or loss of hearing (chronic sinusitis, referral to ENT, recently got hearing aids) HEENT Management Strategies: Medical device, Medication therapy HEENT Self-Management Outcome: 3 (uncertain)    Cardiovascular Cardiovascular Symptoms Reported: No symptoms reported Does patient have uncontrolled Hypertension?: No (controlled with medication does not monitor daily) Weight: 264 lb (119.7 kg)  Respiratory Respiratory Symptoms Reported: Shortness of breath (SOB with exertion)    Endocrine Patient reports the following symptoms related to hypoglycemia or  hyperglycemia : No symptoms reported Is patient diabetic?: Yes Is patient checking blood sugars at home?: Yes    Gastrointestinal Gastrointestinal Symptoms Reported: Constipation (treating with medication) Gastrointestinal Conditions: Constipation Gastrointestinal Management Strategies: Fluid modification, Medication therapy Gastrointestinal Self-Management Outcome: 3 (uncertain)    Genitourinary      Integumentary Additional Integumentary Details: nystatin powder in skin folds Skin Management Strategies: Medication therapy Skin Self-Management Outcome: 4 (good)  Musculoskeletal Musculoskelatal Symptoms Reviewed: Difficulty walking Additional Musculoskeletal Details: uses walker Musculoskeletal Conditions: Mobility limited Musculoskeletal Management Strategies: Medication therapy, Routine screening Falls in the past year?: Yes Number of falls in past year: 1 or less Was there an injury with Fall?: No Fall Risk Category Calculator: 1 Patient Fall Risk Level: Low Fall Risk Patient at Risk for Falls Due to: Impaired mobility  Psychosocial       Quality of Family Relationships: helpful, involved, supportive Do you feel physically threatened by others?: No      04/09/2023    2:07 PM  Depression screen PHQ 2/9  Decreased Interest 0  Down, Depressed, Hopeless 0  PHQ - 2 Score 0  Altered sleeping 0  Tired, decreased energy 0  Change in appetite 0  Feeling bad or failure about yourself  0  Trouble concentrating 0  Moving slowly or fidgety/restless 0  Suicidal thoughts 0  PHQ-9 Score 0  Difficult doing work/chores Not difficult at all    There were no vitals filed for this visit.  Medications Reviewed Today     Reviewed by Ruel Favors, RN (Registered Nurse) on 08/17/23 at 1546  Med List Status: <None>   Medication Order Taking? Sig Documenting Provider Last Dose Status Informant  albuterol (PROAIR HFA) 108 (90 Base) MCG/ACT inhaler [id number] Yes Inhale 1-2 puffs into the  lungs every 6 (six) hours as needed for wheezing or shortness of breath. Sharon Seller, NP Taking Active Family Member, Pharmacy Records           Med Note [place], [facility] D   Tue Jun 05, 2023  9:16 AM) Unknown last dose  amitriptyline (ELAVIL) 75 MG tablet [id number] Yes Take 1 tablet (75 mg total) by mouth at bedtime. [name], MD Taking Active   atorvastatin (LIPITOR) 80 MG tablet [id number] Yes TAKE 1 TABLET BY MOUTH EVERY DAY  Patient taking differently: Take 80 mg by mouth at bedtime.   [name], MD Taking Active Family Member, Pharmacy Records  azelastine (ASTELIN) 0.1 % nasal spray [id number] Yes Place 2 sprays into both nostrils 2 (two) times daily. [name], MD Taking Active Family Member, Pharmacy Records  butalbital-acetaminophen-caffeine [facility]) [phone] MG tablet [id number] No Take 1 tablet by mouth every 6 (six) hours as needed for headache.  Patient not taking: Reported on 08/17/2023   Osvaldo Shipper, MD Not Taking Active   carvedilol (COREG) 12.5 MG tablet [id number] Yes  TAKE 1 TABLET BY MOUTH 2 TIMES DAILY [name], MD Taking Active Family Member, Pharmacy Records  cetirizine (ZYRTEC) 10 MG tablet [id number] Yes Take 10 mg by mouth daily as needed for allergies or rhinitis. [provider] Taking Active Family Member, Pharmacy Records  Continuous Glucose Receiver (FREESTYLE LIBRE 3 READER) [place] [id number] Yes Continuous glucose monitor Shamleffer, Konrad Dolores, MD Taking Active   Continuous Glucose Sensor (FREESTYLE LIBRE 14 DAY SENSOR) [place] [id number] Yes PLACE 1 DEVICE ON THE SKIN AS DIRECTED EVERY 14 DAYS Shamleffer, Konrad Dolores, MD Taking Active   diclofenac Sodium (VOLTAREN) 1 % GEL [id number] Yes Apply 2 g topically 4 (four) times daily.  Patient taking differently: Apply 2 g topically 4 (four) times daily as needed (pain).   [name], MD Taking Active Family Member, Pharmacy Records  docusate sodium (COLACE) 100  MG capsule [id number] No Take 2 capsules (200 mg total) by mouth daily.  Patient not taking: Reported on 08/17/2023   [name], MD Not Taking Active Family Member, Pharmacy Records  [place] [address] [place] TABS tablet [id number] Yes TAKE 1 TABLET BY MOUTH 2 TIMES DAILY [name], MD Taking Active Family Member, Pharmacy Records  febuxostat [facility]) 40 MG tablet [id number] Yes TAKE 1 TABLET BY MOUTH EVERY DAY [name], MD Taking Active Family Member, Pharmacy Records  fluticasone [facility]) 50 MCG/ACT nasal spray [id number] Yes Place 2 sprays into both nostrils daily. Mliss Sax, MD Taking Active   Fluticasone-Umeclidin[facility] ELLIPTA) 100-62.5-25 MCG/ACT AEPB [id number] Yes Inhale 1 puff into the lungs daily. [name], MD Taking Active Family Member, Pharmacy Records  folic acid (FOLVITE) 1 MG tablet [id number] Yes TAKE 1 TABLET BY MOUTH EVERY DAY [name], MD Taking Active Family Member, Pharmacy Records  glucagon 1 MG injection [id number] Yes Inject 1 mg into the vein once as needed for up to 1 dose. [name], MD Taking Active Family Member, Pharmacy Records           Med Note [place], [facility] D   Tue Jun 05, 2023 10:02 AM) Unknown last dose patient has it home if needed  glucose 4 GM chewable tablet [id number] Yes Chew 1 tablet (4 g total) by mouth as needed for low blood sugar. [name], MD Taking Active Family Member, Pharmacy Records  hydrOXYzine (ATARAX) 25 MG tablet [id number] Yes TAKE 1 TABLET BY MOUTH EVERY 8 HOURS AS NEEDED FOR ANXIETY OR ITCHING (NASAL AND THROAT CONGESTION) [name], MD Taking Active   insulin aspart (NOVOLOG FLEXPEN) 100 UNIT/ML FlexPen [id number]  Max daily 45 units Shamleffer, Konrad Dolores, MD  Active   insulin glargine, 2 Unit Dial, (TOUJEO MAX SOLOSTAR) 300 UNIT/ML Solostar Pen [id number] Yes Inject 28 Units into the skin daily before breakfast. Shamleffer, Konrad Dolores, MD Taking Active    ipratropium-albuterol (DUONEB) 0.5-2.5 (3) MG/3ML SOLN [id number]  Take 3 mLs by nebulization every 6 (six) hours as needed (cough, wheezing, or shortness of breath). [name], MD  Expired 06/05/23 2359 Family Member, Pharmacy Records           Med Note [place], [facility] D   Tue Jun 05, 2023 10:03 AM) Unknown last dose  linaclotide Karlene Einstein) 145 MCG CAPS capsule [id number] Yes Patient take 1 capsule daily.  May add an additional capsule for breakthrough constipation associated with opioid use  Patient taking differently: Take 145 mcg by mouth every other day.   [name], MD Taking Active Family  Member, Pharmacy Records  meclizine (ANTIVERT) 25 MG tablet [id number] Yes Take 1 tablet (25 mg total) by mouth 2 (two) times daily. [name], MD Taking Active   melatonin 5 MG TABS [id number] No Take 1 tablet (5 mg total) by mouth at bedtime as needed.  Patient not taking: Reported on 08/17/2023   Osvaldo Shipper, MD Not Taking Active   memantine [facility]) 5 MG tablet [id number] Yes TAKE 2 TABLETS BY MOUTH EVERY MORNING and TAKE 1 TABLET BY MOUTH EVERY EVENING  Patient taking differently: Take 5-10 mg by mouth See admin instructions. Take 10 mg by mouth in the morning and 5 mg in the evening   [name], MD Taking Active Family Member, Pharmacy Records  montelukast (SINGULAIR) 10 MG tablet [id number] No Take 1 tablet (10 mg total) by mouth at bedtime.  Patient not taking: Reported on 08/17/2023   [name], MD Not Taking Active Family Member, Pharmacy Records           Med Note [place], [facility] D   Tue Jun 05, 2023 10:04 AM) Unknown last dose  nitroGLYCERIN (NITROSTAT) 0.4 MG SL tablet [id number] Yes Take one tablet under the tongue every 5 minutes as needed for chest pain  Patient taking differently: Place 0.4 mg under the tongue every 5 (five) minutes as needed for chest pain.   Sharon Seller, NP Taking Active Family Member, Pharmacy Records           Med Note  [place], [facility] D   Tue Jun 05, 2023 10:05 AM) Unknown last dose  nystatin (MYCOSTATIN/NYSTOP) powder [id number] Yes Apply 1 Application topically 3 (three) times daily. Salvatore Decent, FNP Taking Active Family Member, Pharmacy Records  ondansetron [facility]) 4 MG tablet [id number] Yes Take 1 tablet (4 mg total) by mouth every 8 (eight) hours as needed for nausea or vomiting. [name], MD Taking Active   pantoprazole (PROTONIX) 20 MG tablet [id number] Yes TAKE 1 TABLET BY MOUTH EVERY DAY [name], MD Taking Active Family Member, Pharmacy Records  Semaglutide,0.25 or 0.5MG /DOS, (OZEMPIC, 0.25 OR 0.5 MG/DOSE,) 2 MG/3ML SOPN [id number] Yes Inject 0.5 mg into the skin once a week. Shamleffer, Konrad Dolores, MD Taking Active   torsemide (DEMADEX) 20 MG tablet [id number] Yes TAKE 1 TABLET BY MOUTH ONCE DAILY - MAY TAKE AN EXTRA DOES FOR WEIGHT GAIN OF 3LBs IN 1 DAY OR 5LBS IN 1 WEEK OR FOR WORSENING SWELLING [name], MD Taking Active   traMADol (ULTRAM) 50 MG tablet [id number] Yes Take 1 tablet (50 mg total) by mouth every 12 (twelve) hours as needed. Take the first 4 doses with ZOfran to minimize nausea [name], MD Taking Active   TYLENOL 500 MG tablet [id number] Yes Take 1,000 mg by mouth every 6 (six) hours as needed for mild pain or headache. [provider] Taking Active Family Member, Pharmacy Records  TYLENOL PM EXTRA STRENGTH 500-25 MG TABS tablet [id number] Yes Take 2 tablets by mouth at bedtime. [provider] Taking Active Family Member, Pharmacy Records  Vitamin D, Ergocalciferol, (DRISDOL) 1.25 MG (50000 UNIT) CAPS capsule [id number] Yes TAKE 1 CAPSULE BY MOUTH ONCE WEEKLY Candise Che Corene Cornea, MD Taking Active   Med List Note Vernie Ammons, RN 06/15/22 1332): MR 10/01/22.            Recommendation:   PCP Follow-up to discuss symptoms reported to [facility]:  difficulty finding the words when speaking, "jerkiness" in body  movement  Follow  Up Plan:   Call the clinic to make an appointment with PCP   Ruel Favors BSN RN CCM [place]  [facility], [facility] RN Care Manager Direct Dial: [phone] Fax: [phone]

## 2023-08-17 NOTE — Patient Instructions (Signed)
 Visit Information  Thank you for taking time to visit with me today. Please don't hesitate to contact me if I can be of assistance to you before our next scheduled appointment.  Our next appointment is by telephone on 09/04/23 at 3:00 Please call the care guide team at [phone] if you need to cancel or reschedule your appointment.   Following is a copy of your care plan:   Goals Addressed             This Visit's Progress    VBCI RN Care Plan       Problems:  Chronic Disease Management support and education needs related to CKD Stage 4 and DMII  Goal: Over the next 6 months the Patient will attend all scheduled medical appointments:   as evidenced by chart review and patient report        continue to work with RN Care Manager and/or Social Worker to address care management and care coordination needs related to CKD Stage 4 and DMII as evidenced by adherence to CM Team Scheduled appointments     not experience hospital admission as evidenced by review of EMR. Hospital Admissions in last 6 months =   take all medications exactly as prescribed and will call provider for medication related questions as evidenced by chart review and patient report     Interventions:    Chronic Kidney Disease Interventions: Evaluation of current treatment plan related to chronic kidney disease self management and patient's adherence to plan as established by provider      Provided education to patient re: stroke prevention, s/s of heart attack and stroke    Reviewed medications with patient and discussed importance of compliance    Advised patient, providing education and rationale, to monitor blood pressure daily and record, calling PCP for findings outside established parameters    Discussed complications of poorly controlled blood pressure such as heart disease, stroke, circulatory complications, vision complications, kidney impairment, sexual dysfunction    Reviewed scheduled/upcoming provider  appointments including    Advised patient to discuss symptoms of problem with speaking and spasms with provider    Last practice recorded BP readings:  BP Readings from Last 3 Encounters:  08/10/23 (!) 130/50  07/03/23 131/62  06/29/23 (!) 135/48   Most recent eGFR/CrCl:  Lab Results  Component Value Date   EGFR 21 06/22/2023    No components found for: "CRCL"    Diabetes Interventions: Assessed patient's understanding of A1c goal: <7% Reviewed medications with patient and discussed importance of medication adherence Counseled on importance of regular laboratory monitoring as prescribed Discussed plans with patient for ongoing care management follow up and provided patient with direct contact information for care management team Advised patient, providing education and rationale, to check cbg daily and record, calling PCP for findings outside established parameters Lab Results  Component Value Date   HGBA1C 7.7 (A) 06/21/2023    Patient Self-Care Activities:  Attend all scheduled provider appointments Call pharmacy for medication refills 3-7 days in advance of running out of medications Call provider office for new concerns or questions  Take medications as prescribed    Plan:  Telephone follow up appointment with care management team member scheduled for:  0/29/25 at 3:00              Please call the Suicide and Crisis Lifeline: 988 call the [place] National Suicide Prevention Lifeline: 510-254-1317 or TTY: 425-823-1993 TTY 919-584-7205) to talk to a trained counselor call 1-800-273-TALK (toll free,  24 hour hotline) if you are experiencing a Mental Health or Behavioral Health Crisis or need someone to talk to.  Patient verbalizes understanding of instructions and care plan provided today and agrees to view in MyChart. Active MyChart status and patient understanding of how to access instructions and care plan via MyChart confirmed with patient.      Ruel Favors BSN  RN CCM [place]  [facility], [facility] RN Care Manager Direct Dial: [phone] Fax: [phone]

## 2023-08-20 ENCOUNTER — Ambulatory Visit: Payer: Self-pay | Admitting: Family Medicine

## 2023-08-20 NOTE — Telephone Encounter (Signed)
 Noted.

## 2023-08-20 NOTE — Telephone Encounter (Signed)
 Chief Complaint: Low blood pressure  Symptoms: Headache (10/10 pain level) since woke up this morning Pertinent Negatives: Patient denies difficulty breathing  Disposition: [x] ED   Additional Notes: Spoke with pt daughter, Dina Francisco. This RN advised pt daughter to take pt to ED. Pt daughter states understanding and is agreeable to plan. All questions answered at this time.    Reason for Disposition  [1] Drinking very little AND [2] dehydration suspected (e.g., no urine > 12 hours, very dry mouth, very lightheaded)  Answer Assessment - Initial Assessment Questions BLOOD PRESSURE: "What is the blood pressure?" "Did you take at least two measurements 5 minutes apart?"     100/46, 5 mins ago; 106/32, taken while on phone HOW: "How did you obtain the blood pressure?" (e.g., visiting nurse, automatic home BP monitor)     Automatic home BP monitor PULSE RATE: "Do you know what your pulse rate is?"      70 OTHER SYMPTOMS: "Have you been sick recently?" "Have you had a recent injury?"     Headache since woke up this morning, 10/10 pain level  Protocols used: Blood Pressure - Low-A-AH

## 2023-08-20 NOTE — Telephone Encounter (Signed)
 Copied from CRM [id number]. Topic: Clinical - Red Word Triage >> Aug 20, 2023  3:31 PM Earnestine Goes B wrote: [facility] that prompted transfer to Nurse Triage:pt daughter call states pt blood pressure is 100-46

## 2023-08-26 DIAGNOSIS — J449 Chronic obstructive pulmonary disease, unspecified: Secondary | ICD-10-CM | POA: Diagnosis not present

## 2023-09-04 ENCOUNTER — Telehealth: Payer: Self-pay

## 2023-09-05 ENCOUNTER — Ambulatory Visit: Payer: PPO | Admitting: Dermatology

## 2023-09-05 ENCOUNTER — Encounter: Payer: Self-pay | Admitting: Dermatology

## 2023-09-05 ENCOUNTER — Encounter (HOSPITAL_COMMUNITY): Payer: Self-pay | Admitting: Emergency Medicine

## 2023-09-05 ENCOUNTER — Other Ambulatory Visit: Payer: Self-pay

## 2023-09-05 ENCOUNTER — Emergency Department (HOSPITAL_COMMUNITY)
Admission: EM | Admit: 2023-09-05 | Discharge: 2023-09-05 | Disposition: A | Discharge status: Home / Self Care | Attending: Emergency Medicine | Admitting: Emergency Medicine

## 2023-09-05 ENCOUNTER — Other Ambulatory Visit: Payer: Self-pay | Admitting: Family Medicine

## 2023-09-05 ENCOUNTER — Ambulatory Visit: Payer: Self-pay | Admitting: *Deleted

## 2023-09-05 DIAGNOSIS — I5032 Chronic diastolic (congestive) heart failure: Secondary | ICD-10-CM

## 2023-09-05 DIAGNOSIS — L821 Other seborrheic keratosis: Secondary | ICD-10-CM

## 2023-09-05 DIAGNOSIS — E875 Hyperkalemia: Secondary | ICD-10-CM | POA: Diagnosis not present

## 2023-09-05 DIAGNOSIS — L820 Inflamed seborrheic keratosis: Secondary | ICD-10-CM | POA: Diagnosis not present

## 2023-09-05 DIAGNOSIS — R5383 Other fatigue: Secondary | ICD-10-CM | POA: Diagnosis present

## 2023-09-05 DIAGNOSIS — R531 Weakness: Secondary | ICD-10-CM | POA: Diagnosis not present

## 2023-09-05 LAB — CBC
HCT: 31 % — ABNORMAL LOW (ref 36.0–46.0)
Hemoglobin: 9.3 g/dL — ABNORMAL LOW (ref 12.0–15.0)
MCH: 28.9 pg (ref 26.0–34.0)
MCHC: 30 g/dL (ref 30.0–36.0)
MCV: 96.3 fL (ref 80.0–100.0)
Platelets: 229 10*3/uL (ref 150–400)
RBC: 3.22 MIL/uL — ABNORMAL LOW (ref 3.87–5.11)
RDW: 13.7 % (ref 11.5–15.5)
WBC: 8.4 10*3/uL (ref 4.0–10.5)
nRBC: 0 % (ref 0.0–0.2)

## 2023-09-05 LAB — BASIC METABOLIC PANEL WITH GFR
Anion gap: 9 (ref 5–15)
BUN: 64 mg/dL — ABNORMAL HIGH (ref 8–23)
CO2: 22 mmol/L (ref 22–32)
Calcium: 8.5 mg/dL — ABNORMAL LOW (ref 8.9–10.3)
Chloride: 105 mmol/L (ref 98–111)
Creatinine, Ser: 2.51 mg/dL — ABNORMAL HIGH (ref 0.44–1.00)
GFR, Estimated: 18 mL/min — ABNORMAL LOW (ref >60)
Glucose, Bld: 122 mg/dL — ABNORMAL HIGH (ref 70–99)
Potassium: 5.8 mmol/L — ABNORMAL HIGH (ref 3.5–5.1)
Sodium: 136 mmol/L (ref 135–145)

## 2023-09-05 LAB — CBG MONITORING, ED: Glucose-Capillary: 120 mg/dL — ABNORMAL HIGH (ref 70–99)

## 2023-09-05 MED ORDER — LOKELMA 10 G PO PACK: 10.0000 g | PACK | Freq: Two times a day (BID) | ORAL | 0 refills | Status: DC

## 2023-09-05 MED ORDER — SODIUM ZIRCONIUM CYCLOSILICATE 10 G PO PACK
10.0000 g | PACK | Freq: Once | ORAL | Status: AC
  Administered 2023-09-05: 10 g via ORAL
  Filled 2023-09-05: qty 1

## 2023-09-05 NOTE — Telephone Encounter (Addendum)
 Called the number listed for patient and her daughter [name] who is POA and on DPR on file answered the phone. I let her know my reason for calling tht per DOD Kathrene Parents, NP that her mother [name] needs to go to the ED for urgent evaluation. She does not recommend waiting till Friday's appointment with her. Mrs. [name] stated that her mother was at the doctor's office when I asked if it was here or another [facility] she stated no it's dermatology she thinks. When I checked the appointment tab patient was checked in to see dermatology. Mrs. [name] stated that her mother won't go to the ER and that I would need to call the other office to tell them that we want her to go to the ER. She also stated that she will call her sister who is at the other appointment with her what I said. I asked if I could place her on hold. I went to speak to clinic RN supervisor [name] about the call and the triage note. She reviewed the note and agreed with what Soyla Duverney and to tell daughter to call 911 since her mother does not want to go to hospital/ED at least with 911 she can get an evaluation and be taken to hospital. Spoke back with daughter and informed her that I spoke with my clinic RN supervisor and she agreed with DOD and to have family call 911 upon her mother's return home from doctor appointment. Mrs. [name] stated that she spoke with her sister who is with mother at appointment and that they are going to take her to the ED.

## 2023-09-05 NOTE — ED Notes (Signed)
 Patient verbalizes understanding of discharge instructions. Opportunity for questioning and answers were provided. Pt discharged from ED.

## 2023-09-05 NOTE — Telephone Encounter (Signed)
  Chief Complaint: Patient states she is having trouble with getting her words out- she also states her body/hands will "jerk" at times Symptoms: not confusion- just holding thought to get words out, jerking of hands/body and headache Frequency: 2-3 weeks Pertinent Negatives: Patient denies chest pain, difficulty breathing Disposition: [] ED /[] Urgent Care (no appt availability in office) / [x] Appointment(In office/virtual)/ []  [facility]/ [] Home Care/ [] Refused Recommended Disposition /[] [facility]/ []  Follow-up with PCP Additional Notes: Offered same day appointment- but patient states she can not come- offered next available appointment patient can come to- Friday.    Copied from CRM [id number]. Topic: Clinical - Red Word Triage >> Sep 05, 2023 10:22 AM Dewanda Foots wrote: Red Word that prompted transfer to Nurse Triage: confusion, loss of breath mid-sentence, losing train of thought, issues with her speech. Has been ongoing about a month now. Reason for Disposition  [1] Loss of speech or garbled speech AND [2] gradual onset (e.g., days to weeks) AND [3] present now  Answer Assessment - Initial Assessment Questions 1. LEVEL OF CONSCIOUSNESS: "How is he (she, the patient) acting right now?" (e.g., alert-oriented, confused, lethargic, stuporous, comatose)     Studer and "jerk" in body 2. ONSET: "When did the confusion start?"  (minutes, hours, days)     2-3 weeks 3. PATTERN "Does this come and go, or has it been constant since it started?"  "Is it present now?"     Comes and goes 4. ALCOHOL or DRUGS: "Has he been drinking alcohol or taking any drugs?"      no 5. NARCOTIC MEDICINES: "Has he been receiving any narcotic medications?" (e.g., morphine , Vicodin)     no 6. CAUSE: "What do you think is causing the confusion?"      unsure 7. OTHER SYMPTOMS: "Are there any other symptoms?" (e.g., difficulty breathing, headache, fever, weakness)     Headache-  constant,dizzy  Answer Assessment - Initial Assessment Questions 1. SYMPTOM: "What is the main symptom you are concerned about?" (e.g., weakness, numbness)     Studer, not able to get thoughts out, jerking in hands/body 2. ONSET: "When did this start?" (minutes, hours, days; while sleeping)     2-3 weeks 3. LAST NORMAL: "When was the last time you (the patient) were normal (no symptoms)?"     3 weeks ago 4. PATTERN "Does this come and go, or has it been constant since it started?"  "Is it present now?"     Comes and goes 5. CARDIAC SYMPTOMS: "Have you had any of the following symptoms: chest pain, difficulty breathing, palpitations?"     no 6. NEUROLOGIC SYMPTOMS: "Have you had any of the following symptoms: headache, dizziness, vision loss, double vision, changes in speech, unsteady on your feet?"     Headache, speech changes- finds it hard to get thoughts out 7. OTHER SYMPTOMS: "Do you have any other symptoms?"     Jerking in the body  Protocols used: Confusion - Delirium-A-AH, Neurologic Deficit-A-AH

## 2023-09-05 NOTE — Patient Instructions (Addendum)
 Hello Anea,  Thank you for visiting today. Here is a summary of the key instructions:  - Treatment:   - Apply Aquaphor to treated areas morning and night   - Wash face with warm water  and pat dry before applying Aquaphor   - Continue treatment for 2-3 weeks until treated areas fall off  - What to Expect:   - Treated areas may sting for a while   - Treated spots will fall off in 2-3 weeks   - A white area may appear underneath treated spots   - Normal skin color will return over time  - Follow-up:   - Return to the office if you want more spots treated  Please reach out if you have any questions or concerns.  Warm regards,  Dr. Louana Roup, Dermatology     Cryotherapy Aftercare  Wash gently with soap and water  everyday.   Apply Vaseline daily until healed.    Important Information  Due to recent changes in healthcare laws, you may see results of your pathology and/or laboratory studies on MyChart before the doctors have had a chance to review them. We understand that in some cases there may be results that are confusing or concerning to you. Please understand that not all results are received at the same time and often the doctors may need to interpret multiple results in order to provide you with the best plan of care or course of treatment. Therefore, we ask that you please give us  2 business days to thoroughly review all your results before contacting the office for clarification. Should we see a critical lab result, you will be contacted sooner.   If You Need Anything After Your Visit  If you have any questions or concerns for your doctor, please call our main line at [phone] If no one answers, please leave a voicemail as directed and we will return your call as soon as possible. Messages left after 4 pm will be answered the following business day.   You may also send us  a message via MyChart. We typically respond to MyChart messages within 1-2 business  days.  For prescription refills, please ask your pharmacy to contact our office. Our fax number is [phone].  If you have an urgent issue when the clinic is closed that cannot wait until the next business day, you can page your doctor at the number below.    Please note that while we do our best to be available for urgent issues outside of office hours, we are not available 24/7.   If you have an urgent issue and are unable to reach us , you may choose to seek medical care at your doctor's office, retail clinic, urgent care center, or emergency room.  If you have a medical emergency, please immediately call 911 or go to the emergency department. In the event of inclement weather, please call our main line at [phone] for an update on the status of any delays or closures.  Dermatology Medication Tips: Please keep the boxes that topical medications come in in order to help keep track of the instructions about where and how to use these. Pharmacies typically print the medication instructions only on the boxes and not directly on the medication tubes.   If your medication is too expensive, please contact our office at [phone] or send us  a message through MyChart.   We are unable to tell what your co-pay for medications will be in advance as this is different depending on  your insurance coverage. However, we may be able to find a substitute medication at lower cost or fill out paperwork to get insurance to cover a needed medication.   If a prior authorization is required to get your medication covered by your insurance company, please allow us  1-2 business days to complete this process.  Drug prices often vary depending on where the prescription is filled and some pharmacies may offer cheaper prices.  The website www.goodrx.com contains coupons for medications through different pharmacies. The prices here do not account for what the cost may be with help from insurance (it may be  cheaper with your insurance), but the website can give you the price if you did not use any insurance.  - You can print the associated coupon and take it with your prescription to the pharmacy.  - You may also stop by our office during regular business hours and pick up a GoodRx coupon card.  - If you need your prescription sent electronically to a different pharmacy, notify our office through [facility] or by phone at [phone]

## 2023-09-05 NOTE — Progress Notes (Signed)
   New Patient Visit   Subjective  [name] is a 89 y.o. female accompanied with grandson [name]), who presents for the following: New Pt - Spot Check  Patient states she has multiple spots located at the face that she would like to have examined. Patient reports the areas have been there for 2-4 years. She reports the areas are bothersome.Patient rates irritation (itching) 10 out of 10. She states that the areas have not spread. Patient reports she has not previously been treated for these areas but she has tried an OTC itch cream but she can not recall the brand. Patient reports Hx of bx. Patient denied family history of skin cancer(s).   The following portions of the chart were reviewed this encounter and updated as appropriate: medications, allergies, medical history  Review of Systems:  No other skin or systemic complaints except as noted in HPI or Assessment and Plan.  Objective  Well appearing patient in no apparent distress; mood and affect are within normal limits.   A focused examination was performed of the following areas: face   Relevant exam findings are noted in the Assessment and Plan.   Assessment & Plan   1. Seborrheic Keratoses - Assessment: Multiple elevated, superficial growths consistent with seborrheic keratoses. Lesions are itchy, particularly the larger ones. Patient reports history of previous mole removals, including burning in [place]  and shaving in another location. Examination confirms benign seborrheic keratoses associated with aging. Some lesions appear irritated and inflamed.  - Plan:    Perform cryotherapy on multiple seborrheic keratoses, focusing on larger and symptomatic lesions    Treat approximately 15 lesions during this session    Instruct patient on post-treatment care:     - Apply Aquaphor to treated areas morning and night     - Wash face with warm water  and pat dry before application    Inform patient of expected healing process:      - Stinging sensation is normal and may persist for a short while     - Treated lesions will become crusty and fall off within 2-3 weeks     - Temporary hypopigmentation may occur, with normal pigment expected to return    Follow-up as needed for additional treatment of remaining lesions  INFLAMED SEBORRHEIC KERATOSIS (15) Forehead, L temple, L Cheek, R eyebrow, R temple, R cheek (15) Destruction of lesion - Forehead, L temple, L Cheek, R eyebrow, R temple, R cheek (15) Complexity: simple   Destruction method: cryotherapy   Informed consent: discussed and consent obtained   Timeout:  patient name, date of birth, surgical site, and procedure verified Lesion destroyed using liquid nitrogen: Yes   Post-procedure details: wound care instructions given    No follow-ups on file.    Documentation: I have reviewed the above documentation for accuracy and completeness, and I agree with the above.  I, Shirron Louanne Roussel, CMA, am acting as scribe for [employer], DO.   Louana Roup, DO

## 2023-09-05 NOTE — Telephone Encounter (Signed)
 Noted.

## 2023-09-05 NOTE — ED Provider Notes (Signed)
 [place] EMERGENCY DEPARTMENT AT [facility] Provider Note   CSN: [id number] Arrival date & time: 09/05/23  1447     History Chief Complaint  Patient presents with   Gen. Weakness, trouble speaking intermittently x 2 weeks    HPI [name] is a 89 y.o. female presenting for a myriad of symptoms. States that she has been having some vague fatigue and weakness.  States that she is having muscle spasm in her legs.  States that she is having shortness of breath and dyspnea on exertion that intermittent.  States that she had some chest pain last week but now completely resolved.  Overall just states that she is not feeling well. Denies FCNVSSob    Patient's recorded medical, surgical, social, medication list and allergies were reviewed in the Snapshot window as part of the initial history.   Review of Systems   Review of Systems  Constitutional:  Positive for fatigue. Negative for chills and fever.  HENT:  Negative for ear pain and sore throat.   Eyes:  Negative for pain and visual disturbance.  Respiratory:  Positive for shortness of breath. Negative for cough.   Cardiovascular:  Positive for chest pain. Negative for palpitations.  Gastrointestinal:  Negative for abdominal pain and vomiting.  Genitourinary:  Negative for dysuria and hematuria.  Musculoskeletal:  Negative for arthralgias and back pain.  Skin:  Negative for color change and rash.  Neurological:  Positive for weakness and light-headedness. Negative for seizures and syncope.  All other systems reviewed and are negative.   Physical Exam Updated Vital Signs BP 116/73   Pulse 73   Temp 98.3 F (36.8 C) (Oral)   Resp 16   Ht 5\' 7"  (1.702 m)   Wt 123.4 kg   SpO2 100%   BMI 42.60 kg/m  Physical Exam Vitals and nursing note reviewed.  Constitutional:      General: She is not in acute distress.    Appearance: She is well-developed.  HENT:     Head: Normocephalic and atraumatic.  Eyes:      Conjunctiva/sclera: Conjunctivae normal.  Cardiovascular:     Rate and Rhythm: Normal rate and regular rhythm.     Heart sounds: No murmur heard. Pulmonary:     Effort: Pulmonary effort is normal. No respiratory distress.     Breath sounds: Normal breath sounds.  Abdominal:     General: There is no distension.     Palpations: Abdomen is soft.     Tenderness: There is no abdominal tenderness. There is no right CVA tenderness or left CVA tenderness.  Musculoskeletal:        General: No swelling or tenderness. Normal range of motion.     Cervical back: Neck supple.  Skin:    General: Skin is warm and dry.  Neurological:     General: No focal deficit present.     Mental Status: She is alert and oriented to person, place, and time. Mental status is at baseline.     Cranial Nerves: No cranial nerve deficit.      ED Course/ Medical Decision Making/ A&P    Procedures Procedures   Medications Ordered in ED Medications  sodium zirconium cyclosilicate  (LOKELMA ) packet 10 g (10 g Oral Given 09/05/23 1806)    Medical Decision Making:   [name] is a 89 y.o. female who presented to the ED today with multiple complaints detailed above.    Additional history discussed with patient's family/caregivers.  Patient placed  on continuous vitals and telemetry monitoring while in ED which was reviewed periodically.  Complete initial physical exam performed, notably the patient  was hemodynamically stable in no acute distress..    Reviewed and confirmed nursing documentation for past medical history, family history, social history.    Initial Assessment:   This is most consistent with an acute complicated illness Patient's history of present onset physical exam findings are not consistent with any acute pathology.  She has been having the symptoms for a long time.  She endorsed some word finding difficulties but most of her symptoms are just generalized malaise.  They do not localize to  active infectious syndromes or any active metabolic crisis.  Will broadly evaluate for underlying pathology with screening evaluation as below Initial Plan:  Screening labs including CBC and Metabolic panel to evaluate for infectious or metabolic etiology of disease.  EKG to evaluate for cardiac pathology Objective evaluation as below reviewed   Initial Study Results:   Laboratory  All laboratory results reviewed without evidence of clinically relevant pathology.     EKG EKG was reviewed independently. Rate, rhythm, axis, intervals all examined and without medically relevant abnormality. ST segments without concerns for elevations.    Reassessment and Plan:   Patient's history of present illness and physical exam findings are most consistent with high potassium.  This is subacute for the patient.  She was supposed be on Lokelma  but states that she ran out of it and did not want to refill it because she did not like the flavor.  We had a long conversation about the importance of this in the setting of her renal insufficiency and underlying kidney disease. She does not have any acute EKG changes.  Some elevations in her T waves that appear to be acute on chronic. She is ambulatory tolerating p.o. intake and in no acute distress at this time.  Given that she has robustly responded to her Lokelma  in the past, I believe she could be safely treated in the outpatient setting.  Additionally, her family member at bedside states they have already arranged for follow-up with a nephrologist within 48 hours.  Recommended that they keep that appointment to have potassium rechecked at that time and continue with Lokelma  twice daily until seen.  No medications were identified as needing to be immediately changed no potassium sparing diuretics identified on her current medication list that she gave me.   Disposition:  I have considered need for hospitalization, however, considering all of the above, I believe  this patient is stable for discharge at this time.  Patient/family educated about specific return precautions for given chief complaint and symptoms.  Patient/family educated about follow-up with PCP.     Patient/family expressed understanding of return precautions and need for follow-up. Patient spoken to regarding all imaging and laboratory results and appropriate follow up for these results. All education provided in verbal form with additional information in written form. Time was allowed for answering of patient questions. Patient discharged.    Emergency Department Medication Summary:   Medications  sodium zirconium cyclosilicate  (LOKELMA ) packet 10 g (10 g Oral Given 09/05/23 1806)         Clinical Impression:  1. High potassium      Discharge   Final Clinical Impression(s) / ED Diagnoses Final diagnoses:  High potassium    Rx / DC Orders ED Discharge Orders          Ordered    sodium zirconium cyclosilicate  (LOKELMA ) 10  g PACK packet  2 times daily        09/05/23 1742              Onetha Bile, MD 09/05/23 2300

## 2023-09-05 NOTE — ED Triage Notes (Signed)
 Pt comes in with generalized weakness and intermittent trouble speaking x at least 2 weeks.  PT also endorses intermittent jerking motions x approx. 2 weeks.. Pt is getting her words out at this time.

## 2023-09-06 ENCOUNTER — Telehealth: Payer: Self-pay

## 2023-09-06 ENCOUNTER — Other Ambulatory Visit: Payer: Self-pay

## 2023-09-06 DIAGNOSIS — D509 Iron deficiency anemia, unspecified: Secondary | ICD-10-CM

## 2023-09-06 NOTE — Progress Notes (Signed)
 Complex Care Management Care Guide Note  09/06/2023 Name: [name] MRN: [id number] DOB: Feb 17, 1935  [name] is a 89 y.o. year old female who is a primary care patient of Catheryn Cluck, MD and is actively engaged with the care management team. I reached out to [name] by phone today to assist with re-scheduling  with the RN Case Manager.  Follow up plan: Telephone appointment with complex care management team member scheduled for:  09/21/23 at 10:00 a.m.   [facility]  [facility], [facility] Health Care Management Assistant Direct Dial : [phone]  Fax: [phone]

## 2023-09-07 ENCOUNTER — Ambulatory Visit: Admitting: Nurse Practitioner

## 2023-09-07 ENCOUNTER — Inpatient Hospital Stay: Payer: PPO | Attending: Internal Medicine

## 2023-09-07 ENCOUNTER — Inpatient Hospital Stay: Payer: PPO

## 2023-09-07 VITALS — BP 135/74 | HR 75 | Temp 97.6°F | Resp 18

## 2023-09-07 DIAGNOSIS — I509 Heart failure, unspecified: Secondary | ICD-10-CM | POA: Diagnosis not present

## 2023-09-07 DIAGNOSIS — D631 Anemia in chronic kidney disease: Secondary | ICD-10-CM | POA: Diagnosis not present

## 2023-09-07 DIAGNOSIS — I129 Hypertensive chronic kidney disease with stage 1 through stage 4 chronic kidney disease, or unspecified chronic kidney disease: Secondary | ICD-10-CM | POA: Diagnosis not present

## 2023-09-07 DIAGNOSIS — Z170 Estrogen receptor positive status [ER+]: Secondary | ICD-10-CM | POA: Diagnosis not present

## 2023-09-07 DIAGNOSIS — E1122 Type 2 diabetes mellitus with diabetic chronic kidney disease: Secondary | ICD-10-CM | POA: Diagnosis not present

## 2023-09-07 DIAGNOSIS — Z79899 Other long term (current) drug therapy: Secondary | ICD-10-CM | POA: Diagnosis not present

## 2023-09-07 DIAGNOSIS — E875 Hyperkalemia: Secondary | ICD-10-CM | POA: Diagnosis not present

## 2023-09-07 DIAGNOSIS — E1140 Type 2 diabetes mellitus with diabetic neuropathy, unspecified: Secondary | ICD-10-CM | POA: Diagnosis not present

## 2023-09-07 DIAGNOSIS — Z79811 Long term (current) use of aromatase inhibitors: Secondary | ICD-10-CM | POA: Diagnosis not present

## 2023-09-07 DIAGNOSIS — C50912 Malignant neoplasm of unspecified site of left female breast: Secondary | ICD-10-CM | POA: Diagnosis not present

## 2023-09-07 DIAGNOSIS — D509 Iron deficiency anemia, unspecified: Secondary | ICD-10-CM

## 2023-09-07 DIAGNOSIS — N183 Chronic kidney disease, stage 3 unspecified: Secondary | ICD-10-CM | POA: Diagnosis not present

## 2023-09-07 DIAGNOSIS — N2581 Secondary hyperparathyroidism of renal origin: Secondary | ICD-10-CM | POA: Diagnosis not present

## 2023-09-07 LAB — CBC WITH DIFFERENTIAL (CANCER CENTER ONLY)
Abs Immature Granulocytes: 0.02 10*3/uL (ref 0.00–0.07)
Basophils Absolute: 0 10*3/uL (ref 0.0–0.1)
Basophils Relative: 1 %
Eosinophils Absolute: 0.3 10*3/uL (ref 0.0–0.5)
Eosinophils Relative: 3 %
HCT: 30 % — ABNORMAL LOW (ref 36.0–46.0)
Hemoglobin: 9.3 g/dL — ABNORMAL LOW (ref 12.0–15.0)
Immature Granulocytes: 0 %
Lymphocytes Relative: 32 %
Lymphs Abs: 2.7 10*3/uL (ref 0.7–4.0)
MCH: 28.4 pg (ref 26.0–34.0)
MCHC: 31 g/dL (ref 30.0–36.0)
MCV: 91.7 fL (ref 80.0–100.0)
Monocytes Absolute: 0.7 10*3/uL (ref 0.1–1.0)
Monocytes Relative: 8 %
Neutro Abs: 4.8 10*3/uL (ref 1.7–7.7)
Neutrophils Relative %: 56 %
Platelet Count: 235 10*3/uL (ref 150–400)
RBC: 3.27 MIL/uL — ABNORMAL LOW (ref 3.87–5.11)
RDW: 13.6 % (ref 11.5–15.5)
WBC Count: 8.6 10*3/uL (ref 4.0–10.5)
nRBC: 0 % (ref 0.0–0.2)

## 2023-09-07 LAB — CMP (CANCER CENTER ONLY)
ALT: 10 U/L (ref 0–44)
AST: 12 U/L — ABNORMAL LOW (ref 15–41)
Albumin: 3.6 g/dL (ref 3.5–5.0)
Alkaline Phosphatase: 94 U/L (ref 38–126)
Anion gap: 5 (ref 5–15)
BUN: 66 mg/dL — ABNORMAL HIGH (ref 8–23)
CO2: 25 mmol/L (ref 22–32)
Calcium: 8.5 mg/dL — ABNORMAL LOW (ref 8.9–10.3)
Chloride: 108 mmol/L (ref 98–111)
Creatinine: 2.3 mg/dL — ABNORMAL HIGH (ref 0.44–1.00)
GFR, Estimated: 20 mL/min — ABNORMAL LOW (ref >60)
Glucose, Bld: 138 mg/dL — ABNORMAL HIGH (ref 70–99)
Potassium: 5.4 mmol/L — ABNORMAL HIGH (ref 3.5–5.1)
Sodium: 138 mmol/L (ref 135–145)
Total Bilirubin: 0.3 mg/dL (ref 0.0–1.2)
Total Protein: 7.5 g/dL (ref 6.5–8.1)

## 2023-09-07 LAB — IRON AND IRON BINDING CAPACITY (CC-WL,HP ONLY)
Iron: 63 ug/dL (ref 28–170)
Saturation Ratios: 29 % (ref 10.4–31.8)
TIBC: 220 ug/dL — ABNORMAL LOW (ref 250–450)
UIBC: 157 ug/dL (ref 148–442)

## 2023-09-07 LAB — FERRITIN: Ferritin: 258 ng/mL (ref 11–307)

## 2023-09-07 MED ORDER — EPOETIN ALFA 20000 UNIT/ML IJ SOLN
20000.0000 [IU] | Freq: Once | INTRAMUSCULAR | Status: AC
  Administered 2023-09-07: 20000 [IU] via SUBCUTANEOUS
  Filled 2023-09-07: qty 1

## 2023-09-10 ENCOUNTER — Encounter: Payer: Self-pay | Admitting: Internal Medicine

## 2023-09-10 ENCOUNTER — Ambulatory Visit (INDEPENDENT_AMBULATORY_CARE_PROVIDER_SITE_OTHER): Admitting: Internal Medicine

## 2023-09-10 VITALS — BP 128/76 | HR 67 | Temp 97.9°F | Ht 67.0 in | Wt 265.0 lb

## 2023-09-10 DIAGNOSIS — R4789 Other speech disturbances: Secondary | ICD-10-CM | POA: Diagnosis not present

## 2023-09-10 DIAGNOSIS — R519 Headache, unspecified: Secondary | ICD-10-CM

## 2023-09-10 DIAGNOSIS — E875 Hyperkalemia: Secondary | ICD-10-CM | POA: Diagnosis not present

## 2023-09-10 NOTE — Progress Notes (Signed)
 [facility] [address] [place] [address] Dept: [phone] Dept Fax: [phone]  Acute Care Office Visit  Subjective:   [name] 25-Jul-1934 09/10/2023  No chief complaint on file.   HPI: Discussed the use of AI scribe software for clinical note transcription with the patient, who gave verbal consent to proceed.  History of Present Illness   [name] is an 89 year old female who presents with new onset speech difficulties and muscle twitching. She is accompanied by her family, who provide additional information about her symptoms.  She has been experiencing chronic headaches for about a month, occurring all day and night. The headache is described as constant and severe. The headache began before the onset of her speech difficulties and muscle twitching.  Speech difficulties and muscle twitching have been present for the past two to three weeks. She describes her speech as 'jerking' and having trouble getting words out, which her family describes as similar to stuttering. These symptoms have been intermittent over the past few weeks.  She visited the emergency department on April 30th for similar symptoms, where blood work and an EKG were performed. Her potassium level was found to be high 5.8, and she was prescribed Lokelma , which she continues to take. She has followed up with her nephrologist for CKD on Friday 5/2, which showed stable kidney function and K+ improved to 5.4.  However, there has been no improvement in her muscle twitching since starting the medication.  She has a history of right-sided weakness, which has been present for a long time, predating her current headache. There is no recent worsening of this weakness.  She experiences shortness of breath upon movement, which is not a new symptom. No chest pain.  No recent falls or head injuries.   She is currently taking Eliquis , a blood thinner,  and has not missed any doses.      The following portions of the patient's history were reviewed and updated as appropriate: past medical history, past surgical history, family history, social history, allergies, medications, and problem list.   Patient Active Problem List   Diagnosis Date Noted   Hyperkalemia 06/04/2023   Limited mobility 04/09/2023   PND (post-nasal drip) 01/23/2023   Chronic ethmoidal sinusitis 01/11/2023   ABLA (acute blood loss anemia) 01/11/2023   Recurrent acute suppurative otitis media of right ear without spontaneous rupture of tympanic membrane 01/11/2023   Viral URI with cough 01/11/2023   Upper GI bleed 01/03/2023   CAD (coronary artery disease) 01/03/2023   Epigastric abdominal tenderness 01/03/2023   Chills 12/19/2022   Concha bullosa 11/28/2022   Tinnitus of both ears 10/16/2022   Chronic left hip pain 06/13/2022   Dependent for medication administration 06/13/2022   Very limited skin mobility 05/29/2022   Swelling of right upper extremity 04/27/2022   Carpal tunnel syndrome on both sides 04/27/2022   Diabetic peripheral neuropathy associated with type 2 diabetes mellitus (HCC) 04/05/2022   COPD with acute exacerbation (HCC) 03/26/2022   GAD (generalized anxiety disorder) 03/26/2022   Allergic rhinitis 03/26/2022   Type 2 diabetes mellitus with stage 4 chronic kidney disease, with long-term current use of insulin  (HCC) 02/16/2022   Type 2 diabetes mellitus with diabetic neuropathy, with long-term current use of insulin  (HCC) 02/16/2022   Dermatosis papulosa nigra 02/09/2022   CKD (chronic kidney disease), stage IV (HCC) 02/09/2022   Pneumobilia 02/08/2022   Severe episode of recurrent major depressive disorder, without psychotic  features (HCC) 12/05/2021   Breast cancer (HCC) 10/22/2021   Chronic pain of right hand 04/27/2021   Iron  deficiency anemia 09/13/2020   Bilateral primary osteoarthritis of knee 04/12/2020   Chronic radicular lumbar  pain 03/18/2020   Lumbar spondylosis 03/18/2020   Spinal stenosis, lumbar region, with neurogenic claudication 03/18/2020   Moderate nonproliferative diabetic retinopathy of both eyes without macular edema associated with type 2 diabetes mellitus (HCC) 08/27/2019   Type 2 diabetes mellitus with hyperlipidemia (HCC) 12/13/2017   GERD with esophagitis 11/15/2016   Dementia (HCC) 11/15/2016   Endometrial polyp 06/23/2016   Vitamin D  deficiency 08/02/2015   Acute kidney injury superimposed on chronic kidney disease (HCC) 12/18/2014   Gout 10/27/2014   Vertigo 09/24/2014   Drug-induced constipation 05/10/2014   COPD with chronic bronchitis (HCC) 04/21/2014   Estrogen deficiency 02/19/2014   Insomnia 09/17/2012   Long term current use of anticoagulant therapy 02/02/2012   Chronic diastolic CHF (congestive heart failure) (HCC) 01/29/2012   History of DVT (deep vein thrombosis) 01/27/2012   Class 3 obesity 02/15/2009   Symptomatic anemia 02/15/2009   TENSION HEADACHE 01/07/2008   Hyperlipidemia 12/03/2006   Essential hypertension 12/03/2006   Past Medical History:  Diagnosis Date   Abdominal pain 01/26/2012   Acute deep vein thrombosis (DVT) of femoral vein of right lower extremity (HCC) 03/26/2022   Acute kidney injury superimposed on chronic kidney disease (HCC) 12/18/2014   Acute upper GI bleed 03/26/2022   Allergy     takes Mucinex  daily as needed   Anemia, unspecified    Anxiety    takes Clonazepam  daily as needed   Anxiety associated with depression 06/08/2017   Arthritis    Breast cancer (HCC)    Breast cancer screening 02/19/2014   Breast mass 10/27/2014   Chronic diastolic CHF (congestive heart failure) (HCC)    takes Furosemide  daily   Chronic pain syndrome 02/06/2015   CKD (chronic kidney disease), stage III (HCC)    Coronary artery disease    a. s/p IMI 2004 tx with BMS to RCA;  b. s/p Promus DES to RCA 2/12 c. abnormal nuc 2016 -> cath with med rx. d. NSTEMI 02/2020   mv CAD with severe ISR in pRCA and m/dRCA lesion both treated with DES.   Debility 08/01/2012   Dementia (HCC)    Depression    takes Cymbalta  daily   Diabetes mellitus    insulin  daily   DOE (dyspnea on exertion) 06/24/2014   Dysphagia, unspecified(787.20) 07/31/2012   Fungal dermatitis 11/13/2007   Qualifier: Diagnosis of  By: Minnette Amato  MD, Ronie Cohen    GERD (gastroesophageal reflux disease)    takes Protonix  daily   Gout, unspecified    Headache    occasionally   History of anemia due to chronic kidney disease 03/26/2022   History of blood clots    History of deep venous thrombosis 01/27/2012   History of pulmonary embolus (PE) 04/22/2014   Hyperkalemia 07/26/2017   Hyperlipidemia    takes Pravastatin  daily   Hypertension    Insomnia    Joint pain    Joint swelling    Morbid obesity (HCC)    Multiple benign nevi 11/15/2016   Myocardial infarction (HCC) 2004   Non-STEMI (non-ST elevated myocardial infarction) (HCC) 02/15/2020   Numbness    Obstructive sleep apnea    does not wear cpap   Osteoarthritis    Osteoarthritis    Osteoarthrosis, unspecified whether generalized or localized, lower leg    Pain  in the chest 12/31/2013   Pain, chronic    Personal history of other diseases of the digestive system 12/03/2006   Qualifier: Diagnosis of  By: Minnette Amato  MD, Ronie Cohen    Polymyalgia rheumatica [facility])    Presence of stent in right coronary artery    Pulmonary emboli (HCC) 01/2012   felt to need lifelong anticoagulation but Xarelto  dc in 02/2020 due to need for DAPT   Situational depression 06/04/2009   Qualifier: Diagnosis of  By: Minnette Amato  MD, Ronie Cohen    Syncope, vasovagal 07/04/2021   Type 2 diabetes mellitus with hyperlipidemia (HCC) 02/16/2022   Urinary incontinence    takes Linzess  daily   Vaginitis and vulvovaginitis 06/23/2016   Vertigo    hx of;was taking Meclizine  if needed   Wears dentures    Past Surgical History:  Procedure Laterality Date    ABDOMINAL HYSTERECTOMY     partial   APPENDECTOMY     blood clots/legs and lungs  2013   BREAST BIOPSY Left 07/22/2014   BREAST BIOPSY Left 02/10/2013   BREAST LUMPECTOMY Left 11/05/2014   BREAST LUMPECTOMY WITH RADIOACTIVE SEED LOCALIZATION Left 11/05/2014   Procedure: LEFT BREAST LUMPECTOMY WITH RADIOACTIVE SEED LOCALIZATION;  Surgeon: Oza Blumenthal, MD;  Location: [facility] OR;  Service: General;  Laterality: Left;   CARDIAC CATHETERIZATION     COLONOSCOPY     CORONARY ANGIOPLASTY  2   CORONARY STENT INTERVENTION N/A 02/17/2020   Procedure: CORONARY STENT INTERVENTION;  Surgeon: Arleen Lacer, MD;  Location: [facility] INVASIVE CV LAB;  Service: Cardiovascular;  Laterality: N/A;   ESOPHAGOGASTRODUODENOSCOPY (EGD) WITH PROPOFOL  N/A 11/07/2016   Procedure: ESOPHAGOGASTRODUODENOSCOPY (EGD) WITH PROPOFOL ;  Surgeon: Kenney Peacemaker, MD;  Location: [facility] ENDOSCOPY;  Service: Endoscopy;  Laterality: N/A;   ESOPHAGOGASTRODUODENOSCOPY (EGD) WITH PROPOFOL  N/A 01/05/2023   Procedure: ESOPHAGOGASTRODUODENOSCOPY (EGD) WITH PROPOFOL ;  Surgeon: Tobin Forts, MD;  Location: [facility] ENDOSCOPY;  Service: Gastroenterology;  Laterality: N/A;   EXCISION OF SKIN TAG Right 11/05/2014   Procedure: EXCISION OF RIGHT EYELID SKIN TAG;  Surgeon: Oza Blumenthal, MD;  Location: [facility] OR;  Service: General;  Laterality: Right;   EYE SURGERY Bilateral    cataract    GASTRIC BYPASS  1977    reversed in [facility], [facility]   LEFT HEART CATH AND CORONARY ANGIOGRAPHY N/A 02/17/2020   Procedure: LEFT HEART CATH AND CORONARY ANGIOGRAPHY;  Surgeon: Arleen Lacer, MD;  Location: [facility] INVASIVE CV LAB;  Service: Cardiovascular;  Laterality: N/A;   LEFT HEART CATHETERIZATION WITH CORONARY ANGIOGRAM N/A 06/29/2014   Procedure: LEFT HEART CATHETERIZATION WITH CORONARY ANGIOGRAM;  Surgeon: Millicent Ally, MD;  Location: [facility] CATH LAB;  Service: Cardiovascular;  Laterality: N/A;   MEMBRANE PEEL Right 10/23/2018   Procedure: MEMBRANE PEEL;  Surgeon:  Shon Downing, MD;  Location: [facility] OR;  Service: Ophthalmology;  Laterality: Right;   MI with stent placement  2004   PARS PLANA VITRECTOMY Right 10/23/2018   Procedure: PARS PLANA VITRECTOMY WITH 25 GAUGE;  Surgeon: Shon Downing, MD;  Location: [facility] OR;  Service: Ophthalmology;  Laterality: Right;   Family History  Problem Relation Age of Onset   Breast cancer Mother 38   Heart disease Mother    Throat cancer Father    Hypertension Father    Arthritis Father    Diabetes Father    Arthritis Sister    Obesity Sister    Diabetes Sister    Heart disease Cousin    Colon cancer Neg  Hx    Stomach cancer Neg Hx    Esophageal cancer Neg Hx     Current Outpatient Medications:    albuterol  (PROAIR  HFA) 108 (90 Base) MCG/ACT inhaler, Inhale 1-2 puffs into the lungs every 6 (six) hours as needed for wheezing or shortness of breath., Disp: 6.7 g, Rfl: 1   amitriptyline  (ELAVIL ) 75 MG tablet, Take 1 tablet (75 mg total) by mouth at bedtime., Disp: 90 tablet, Rfl: 0   atorvastatin  (LIPITOR ) 80 MG tablet, TAKE 1 TABLET BY MOUTH EVERY DAY (Patient taking differently: Take 80 mg by mouth at bedtime.), Disp: 90 tablet, Rfl: 3   azelastine  (ASTELIN ) 0.1 % nasal spray, Place 2 sprays into both nostrils 2 (two) times daily., Disp: 30 mL, Rfl: 12   butalbital -acetaminophen -caffeine  (FIORICET ) 50-325-40 MG tablet, Take 1 tablet by mouth every 6 (six) hours as needed for headache., Disp: 20 tablet, Rfl: 0   carvedilol  (COREG ) 12.5 MG tablet, TAKE 1 TABLET BY MOUTH 2 TIMES DAILY, Disp: 180 tablet, Rfl: 1   cetirizine  (ZYRTEC ) 10 MG tablet, Take 10 mg by mouth daily as needed for allergies or rhinitis., Disp: , Rfl:    Continuous Glucose Receiver (FREESTYLE LIBRE 3 READER) DEVI, Continuous glucose monitor, Disp: 1 each, Rfl: 0   Continuous Glucose Sensor (FREESTYLE LIBRE 14 DAY SENSOR) MISC, PLACE 1 DEVICE ON THE SKIN AS DIRECTED EVERY 14 DAYS, Disp: 6 each, Rfl: 3   diclofenac  Sodium (VOLTAREN ) 1 % GEL, Apply 2  g topically 4 (four) times daily. (Patient taking differently: Apply 2 g topically 4 (four) times daily as needed (pain).), Disp: 150 g, Rfl: 2   docusate sodium  (COLACE) 100 MG capsule, Take 2 capsules (200 mg total) by mouth daily., Disp: 180 capsule, Rfl: 3   ELIQUIS  5 MG TABS tablet, TAKE 1 TABLET BY MOUTH 2 TIMES DAILY, Disp: 180 tablet, Rfl: 3   febuxostat  (ULORIC ) 40 MG tablet, TAKE 1 TABLET BY MOUTH EVERY DAY, Disp: 90 tablet, Rfl: 3   fluticasone  (FLONASE ) 50 MCG/ACT nasal spray, Place 2 sprays into both nostrils daily., Disp: 16 g, Rfl: 6   Fluticasone -Umeclidin-Vilant (TRELEGY ELLIPTA ) 100-62.5-25 MCG/ACT AEPB, Inhale 1 puff into the lungs daily., Disp: 1 each, Rfl: 11   folic acid  (FOLVITE ) 1 MG tablet, TAKE 1 TABLET BY MOUTH EVERY DAY, Disp: 90 tablet, Rfl: 1   glucagon  1 MG injection, Inject 1 mg into the vein once as needed for up to 1 dose., Disp: 1 each, Rfl: 12   glucose 4 GM chewable tablet, Chew 1 tablet (4 g total) by mouth as needed for low blood sugar., Disp: 50 tablet, Rfl: 12   hydrOXYzine  (ATARAX ) 25 MG tablet, TAKE 1 TABLET BY MOUTH EVERY 8 HOURS AS NEEDED FOR ANXIETY OR ITCHING (NASAL AND THROAT CONGESTION), Disp: 120 tablet, Rfl: 0   insulin  aspart (NOVOLOG  FLEXPEN) 100 UNIT/ML FlexPen, Max daily 45 units, Disp: 45 mL, Rfl: 4   insulin  glargine, 2 Unit Dial , (TOUJEO  MAX SOLOSTAR) 300 UNIT/ML Solostar Pen, Inject 28 Units into the skin daily before breakfast., Disp: 15 mL, Rfl: 3   linaclotide  (LINZESS ) 145 MCG CAPS capsule, Patient take 1 capsule daily.  May add an additional capsule for breakthrough constipation associated with opioid use (Patient taking differently: Take 145 mcg by mouth every other day.), Disp: 180 capsule, Rfl: 1   meclizine  (ANTIVERT ) 25 MG tablet, Take 1 tablet (25 mg total) by mouth 2 (two) times daily., Disp: 180 tablet, Rfl: 1   melatonin 5 MG TABS, Take  1 tablet (5 mg total) by mouth at bedtime as needed., Disp: 15 tablet, Rfl: 0   memantine   (NAMENDA ) 5 MG tablet, TAKE 2 TABLETS BY MOUTH EVERY MORNING and TAKE 1 TABLET BY MOUTH EVERY EVENING (Patient taking differently: Take 5-10 mg by mouth See admin instructions. Take 10 mg by mouth in the morning and 5 mg in the evening), Disp: 270 tablet, Rfl: 5   montelukast  (SINGULAIR ) 10 MG tablet, Take 1 tablet (10 mg total) by mouth at bedtime., Disp: 30 tablet, Rfl: 3   nitroGLYCERIN  (NITROSTAT ) 0.4 MG SL tablet, Take one tablet under the tongue every 5 minutes as needed for chest pain (Patient taking differently: Place 0.4 mg under the tongue every 5 (five) minutes as needed for chest pain.), Disp: 50 tablet, Rfl: 0   nystatin  (MYCOSTATIN /NYSTOP ) powder, Apply 1 Application topically 3 (three) times daily., Disp: 60 g, Rfl: 3   ondansetron  (ZOFRAN ) 4 MG tablet, Take 1 tablet (4 mg total) by mouth every 8 (eight) hours as needed for nausea or vomiting., Disp: 20 tablet, Rfl: 0   pantoprazole  (PROTONIX ) 20 MG tablet, TAKE 1 TABLET BY MOUTH EVERY DAY, Disp: 30 tablet, Rfl: 5   Semaglutide ,0.25 or 0.5MG /DOS, (OZEMPIC , 0.25 OR 0.5 MG/DOSE,) 2 MG/3ML SOPN, Inject 0.5 mg into the skin once a week., Disp: 9 mL, Rfl: 3   sodium zirconium cyclosilicate  (LOKELMA ) 10 g PACK packet, Take 10 g by mouth 2 (two) times daily., Disp: 60 packet, Rfl: 0   torsemide  (DEMADEX ) 20 MG tablet, TAKE 1 TABLET BY MOUTH ONCE DAILY MAY TAKE AN EXTRA DOES FOR WEIGHT GAIN OF 3LBs IN 1 DAY OR 5LBS IN 1 WEEK OR FOR WORSENING SWELLING, Disp: 30 tablet, Rfl: 0   traMADol  (ULTRAM ) 50 MG tablet, Take 1 tablet (50 mg total) by mouth every 12 (twelve) hours as needed. Take the first 4 doses with ZOfran  to minimize nausea, Disp: 30 tablet, Rfl: 0   TYLENOL  500 MG tablet, Take 1,000 mg by mouth every 6 (six) hours as needed for mild pain or headache., Disp: , Rfl:    TYLENOL  PM EXTRA STRENGTH 500-25 MG TABS tablet, Take 2 tablets by mouth at bedtime., Disp: , Rfl:    Vitamin D , Ergocalciferol , (DRISDOL ) 1.25 MG (50000 UNIT) CAPS capsule,  TAKE 1 CAPSULE BY MOUTH ONCE WEEKLY, Disp: 4 capsule, Rfl: 1   ipratropium-albuterol  (DUONEB) 0.5-2.5 (3) MG/3ML SOLN, Take 3 mLs by nebulization every 6 (six) hours as needed (cough, wheezing, or shortness of breath)., Disp: 360 mL, Rfl: 0 Allergies  Allergen Reactions   Sulfonamide Derivatives Swelling and Other (See Comments)    Mouth swelling- no resp issues noted   Duloxetine      Headaches   Lokelma  [Sodium Zirconium Cyclosilicate ] Other (See Comments)    Headache, stomach upset   Aricept  [Donepezil  Hcl] Diarrhea, Nausea And Vomiting and Other (See Comments)    GI upset/loose stools   Tramadol  Nausea And Vomiting     ROS: A complete ROS was performed with pertinent positives/negatives noted in the HPI. The remainder of the ROS are negative.    Objective:   Today's Vitals   09/10/23 1016  BP: 128/76  Pulse: 67  Temp: 97.9 F (36.6 C)  TempSrc: Temporal  SpO2: 98%  Weight: 265 lb (120.2 kg)  Height: 5\' 7"  (1.702 m)    GENERAL: Well-appearing, in NAD. Well nourished.  SKIN: Pink, warm and dry. No rash, lesion, ulceration, or ecchymoses.  NECK: Trachea midline. Full ROM w/o pain or tenderness.  No lymphadenopathy.  RESPIRATORY: Chest wall symmetrical. Respirations even and non-labored. Breath sounds clear to auscultation bilaterally.  CARDIAC: S1, S2 present, regular rate and rhythm. Peripheral pulses 2+ bilaterally.  EXTREMITIES: Without clubbing, cyanosis, or edema.  NEUROLOGIC: No motor or sensory deficits.  PSYCH/MENTAL STATUS: Alert, oriented x 3. Cooperative, appropriate mood and affect.    No results found for any visits on 09/10/23.    Assessment & Plan:  Assessment and Plan    Chronic headache Chronic headache for a month, worsens when lying back. Previous imaging post-fall in December showed no abnormalities. - Order CT scan of the head to evaluate for potential stroke or other causes of headache.  Speech difficulties Intermittent speech difficulties for  2-3 weeks. Differential includes stroke. - Order CT scan of the head to evaluate for potential stroke. - Consider referral to neurology if CT scan is negative.  Hyperkalemia Previously elevated potassium levels managed with Lokelma . Recent labs show decreasing potassium levels and well-managed kidney function. - Continued nephrology monitoring planned.       No orders of the defined types were placed in this encounter.  Orders Placed This Encounter  Procedures   CT HEAD WO CONTRAST ( )    Standing Status:   Future    Expiration Date:   09/09/2024    Preferred imaging location?:   [facility]   Lab Orders  No laboratory test(s) ordered today   No images are attached to the encounter or orders placed in the encounter.  Return in about 3 months (around 12/11/2023).   Gavin Kast, FNP

## 2023-09-11 ENCOUNTER — Other Ambulatory Visit: Payer: Self-pay | Admitting: Family Medicine

## 2023-09-11 DIAGNOSIS — F411 Generalized anxiety disorder: Secondary | ICD-10-CM

## 2023-09-11 DIAGNOSIS — G4709 Other insomnia: Secondary | ICD-10-CM

## 2023-09-11 DIAGNOSIS — E1140 Type 2 diabetes mellitus with diabetic neuropathy, unspecified: Secondary | ICD-10-CM

## 2023-09-12 ENCOUNTER — Other Ambulatory Visit: Payer: Self-pay

## 2023-09-12 ENCOUNTER — Emergency Department (HOSPITAL_COMMUNITY)
Admission: EM | Admit: 2023-09-12 | Discharge: 2023-09-13 | Disposition: A | Discharge status: Home / Self Care | Attending: Emergency Medicine | Admitting: Emergency Medicine

## 2023-09-12 ENCOUNTER — Encounter (HOSPITAL_COMMUNITY): Payer: Self-pay

## 2023-09-12 DIAGNOSIS — M316 Other giant cell arteritis: Secondary | ICD-10-CM | POA: Insufficient documentation

## 2023-09-12 DIAGNOSIS — Z7951 Long term (current) use of inhaled steroids: Secondary | ICD-10-CM | POA: Diagnosis not present

## 2023-09-12 DIAGNOSIS — G4489 Other headache syndrome: Secondary | ICD-10-CM | POA: Diagnosis not present

## 2023-09-12 DIAGNOSIS — I2510 Atherosclerotic heart disease of native coronary artery without angina pectoris: Secondary | ICD-10-CM | POA: Diagnosis not present

## 2023-09-12 DIAGNOSIS — Z79899 Other long term (current) drug therapy: Secondary | ICD-10-CM | POA: Insufficient documentation

## 2023-09-12 DIAGNOSIS — E119 Type 2 diabetes mellitus without complications: Secondary | ICD-10-CM | POA: Diagnosis not present

## 2023-09-12 DIAGNOSIS — I129 Hypertensive chronic kidney disease with stage 1 through stage 4 chronic kidney disease, or unspecified chronic kidney disease: Secondary | ICD-10-CM | POA: Diagnosis not present

## 2023-09-12 DIAGNOSIS — N189 Chronic kidney disease, unspecified: Secondary | ICD-10-CM | POA: Diagnosis not present

## 2023-09-12 DIAGNOSIS — I10 Essential (primary) hypertension: Secondary | ICD-10-CM | POA: Diagnosis not present

## 2023-09-12 DIAGNOSIS — Z7901 Long term (current) use of anticoagulants: Secondary | ICD-10-CM | POA: Insufficient documentation

## 2023-09-12 DIAGNOSIS — I6782 Cerebral ischemia: Secondary | ICD-10-CM | POA: Diagnosis not present

## 2023-09-12 DIAGNOSIS — C50912 Malignant neoplasm of unspecified site of left female breast: Secondary | ICD-10-CM | POA: Diagnosis not present

## 2023-09-12 DIAGNOSIS — Z794 Long term (current) use of insulin: Secondary | ICD-10-CM | POA: Insufficient documentation

## 2023-09-12 DIAGNOSIS — R110 Nausea: Secondary | ICD-10-CM | POA: Insufficient documentation

## 2023-09-12 DIAGNOSIS — J449 Chronic obstructive pulmonary disease, unspecified: Secondary | ICD-10-CM | POA: Insufficient documentation

## 2023-09-12 DIAGNOSIS — R42 Dizziness and giddiness: Secondary | ICD-10-CM | POA: Diagnosis not present

## 2023-09-12 DIAGNOSIS — F0390 Unspecified dementia without behavioral disturbance: Secondary | ICD-10-CM | POA: Insufficient documentation

## 2023-09-12 DIAGNOSIS — R231 Pallor: Secondary | ICD-10-CM | POA: Diagnosis not present

## 2023-09-12 DIAGNOSIS — R519 Headache, unspecified: Secondary | ICD-10-CM | POA: Diagnosis not present

## 2023-09-12 DIAGNOSIS — R0902 Hypoxemia: Secondary | ICD-10-CM | POA: Diagnosis not present

## 2023-09-12 LAB — BASIC METABOLIC PANEL WITH GFR
Anion gap: 11 (ref 5–15)
BUN: 62 mg/dL — ABNORMAL HIGH (ref 8–23)
CO2: 21 mmol/L — ABNORMAL LOW (ref 22–32)
Calcium: 8.4 mg/dL — ABNORMAL LOW (ref 8.9–10.3)
Chloride: 104 mmol/L (ref 98–111)
Creatinine, Ser: 2.29 mg/dL — ABNORMAL HIGH (ref 0.44–1.00)
GFR, Estimated: 20 mL/min — ABNORMAL LOW (ref >60)
Glucose, Bld: 170 mg/dL — ABNORMAL HIGH (ref 70–99)
Potassium: 4.4 mmol/L (ref 3.5–5.1)
Sodium: 136 mmol/L (ref 135–145)

## 2023-09-12 LAB — CBC WITH DIFFERENTIAL/PLATELET
Abs Immature Granulocytes: 0.04 10*3/uL (ref 0.00–0.07)
Basophils Absolute: 0 10*3/uL (ref 0.0–0.1)
Basophils Relative: 0 %
Eosinophils Absolute: 0.3 10*3/uL (ref 0.0–0.5)
Eosinophils Relative: 3 %
HCT: 31.4 % — ABNORMAL LOW (ref 36.0–46.0)
Hemoglobin: 9.4 g/dL — ABNORMAL LOW (ref 12.0–15.0)
Immature Granulocytes: 0 %
Lymphocytes Relative: 33 %
Lymphs Abs: 3.6 10*3/uL (ref 0.7–4.0)
MCH: 29.1 pg (ref 26.0–34.0)
MCHC: 29.9 g/dL — ABNORMAL LOW (ref 30.0–36.0)
MCV: 97.2 fL (ref 80.0–100.0)
Monocytes Absolute: 0.8 10*3/uL (ref 0.1–1.0)
Monocytes Relative: 8 %
Neutro Abs: 6.2 10*3/uL (ref 1.7–7.7)
Neutrophils Relative %: 56 %
Platelets: 217 10*3/uL (ref 150–400)
RBC: 3.23 MIL/uL — ABNORMAL LOW (ref 3.87–5.11)
RDW: 13.8 % (ref 11.5–15.5)
WBC: 11.1 10*3/uL — ABNORMAL HIGH (ref 4.0–10.5)
nRBC: 0 % (ref 0.0–0.2)

## 2023-09-12 MED ORDER — ONDANSETRON HCL 4 MG/2ML IJ SOLN
4.0000 mg | Freq: Once | INTRAMUSCULAR | Status: AC
  Administered 2023-09-12: 4 mg via INTRAVENOUS
  Filled 2023-09-12: qty 2

## 2023-09-12 NOTE — ED Triage Notes (Signed)
 Pt c.o headache x 3 weeks, blurred vision and nausea. Hx of vertigo. Took a BC powder that made her headache worse. Stroke screen negative. No recent falls/trauma.

## 2023-09-12 NOTE — ED Provider Notes (Incomplete)
  EMERGENCY DEPARTMENT AT [facility] Provider Note   CSN: [id number] Arrival date & time: 09/12/23  2205     History {Add pertinent medical, surgical, social history, OB history to HPI:1} Chief Complaint  Patient presents with  . Headache  . Nausea    [name] is a 89 y.o. female with PMHx anemia, DM, anxiety, depression, chronic pain syndrome, CKD, CAD, OA, dementia, GERD, headaches, HLD, HTN, OSA, polymyalgia rheumatica, vertigo, COPD who presents to ED.   Headache      Home Medications Prior to Admission medications   Medication Sig Start Date End Date Taking? Authorizing Provider  albuterol  (PROAIR  HFA) 108 (90 Base) MCG/ACT inhaler Inhale 1-2 puffs into the lungs every 6 (six) hours as needed for wheezing or shortness of breath. 12/19/18   Verma Gobble, NP  amitriptyline  (ELAVIL ) 75 MG tablet TAKE 1 TABLET BY MOUTH AT BEDTIME 09/11/23   Catheryn Cluck, MD  atorvastatin  (LIPITOR ) 80 MG tablet TAKE 1 TABLET BY MOUTH EVERY DAY Patient taking differently: Take 80 mg by mouth at bedtime. 12/04/22   Catheryn Cluck, MD  azelastine  (ASTELIN ) 0.1 % nasal spray Place 2 sprays into both nostrils 2 (two) times daily. 01/23/23   Catheryn Cluck, MD  butalbital -acetaminophen -caffeine  (FIORICET ) 50-325-40 MG tablet Take 1 tablet by mouth every 6 (six) hours as needed for headache. 06/08/23   Krishnan, Gokul, MD  carvedilol  (COREG ) 12.5 MG tablet TAKE 1 TABLET BY MOUTH 2 TIMES DAILY 05/17/23   Catheryn Cluck, MD  cetirizine  (ZYRTEC ) 10 MG tablet Take 10 mg by mouth daily as needed for allergies or rhinitis.    [provider]  Continuous Glucose Receiver (FREESTYLE LIBRE 3 READER) DEVI Continuous glucose monitor 06/21/23   Shamleffer, Julian Obey, MD  Continuous Glucose Sensor (FREESTYLE LIBRE 14 DAY SENSOR) MISC PLACE 1 DEVICE ON THE SKIN AS DIRECTED EVERY 14 DAYS 07/16/23   Shamleffer, Julian Obey, MD  diclofenac  Sodium (VOLTAREN ) 1 % GEL  Apply 2 g topically 4 (four) times daily. Patient taking differently: Apply 2 g topically 4 (four) times daily as needed (pain). 02/23/22   Catheryn Cluck, MD  docusate sodium  (COLACE) 100 MG capsule Take 2 capsules (200 mg total) by mouth daily. 12/19/22 12/14/23  Catheryn Cluck, MD  ELIQUIS  5 MG TABS tablet TAKE 1 TABLET BY MOUTH 2 TIMES DAILY 04/09/23   Catheryn Cluck, MD  febuxostat  (ULORIC ) 40 MG tablet TAKE 1 TABLET BY MOUTH EVERY DAY 02/22/23   Catheryn Cluck, MD  fluticasone  (FLONASE ) 50 MCG/ACT nasal spray Place 2 sprays into both nostrils daily. 06/12/23   Tonna Frederic, MD  Fluticasone -Umeclidin-Vilant (TRELEGY ELLIPTA ) 100-62.5-25 MCG/ACT AEPB Inhale 1 puff into the lungs daily. 01/10/23 01/05/24  Catheryn Cluck, MD  folic acid  (FOLVITE ) 1 MG tablet TAKE 1 TABLET BY MOUTH EVERY DAY 05/17/23   Catheryn Cluck, MD  glucagon  1 MG injection Inject 1 mg into the vein once as needed for up to 1 dose. 04/13/22   Catheryn Cluck, MD  glucose 4 GM chewable tablet Chew 1 tablet (4 g total) by mouth as needed for low blood sugar. 04/05/22   Catheryn Cluck, MD  hydrOXYzine  (ATARAX ) 25 MG tablet TAKE 1 TABLET BY MOUTH EVERY 8 HOURS AS NEEDED FOR ANXIETY OR ITCHING (NASAL AND THROAT CONGESTION) 06/05/23   Catheryn Cluck, MD  insulin  aspart (NOVOLOG  FLEXPEN) 100 UNIT/ML FlexPen Max daily 45 units 06/21/23   Shamleffer, [employer]  Jaralla, MD  insulin  glargine, 2 Unit Dial , (TOUJEO  MAX SOLOSTAR) 300 UNIT/ML Solostar Pen Inject 28 Units into the skin daily before breakfast. 06/21/23   Shamleffer, Ibtehal Jaralla, MD  ipratropium-albuterol  (DUONEB) 0.5-2.5 (3) MG/3ML SOLN Take 3 mLs by nebulization every 6 (six) hours as needed (cough, wheezing, or shortness of breath). 01/10/23 06/05/23  Catheryn Cluck, MD  linaclotide  (LINZESS ) 145 MCG CAPS capsule Patient take 1 capsule daily.  May add an additional capsule for breakthrough constipation associated with opioid use Patient taking  differently: Take 145 mcg by mouth every other day. 12/19/22   Catheryn Cluck, MD  meclizine  (ANTIVERT ) 25 MG tablet Take 1 tablet (25 mg total) by mouth 2 (two) times daily. 07/20/23   Catheryn Cluck, MD  melatonin 5 MG TABS Take 1 tablet (5 mg total) by mouth at bedtime as needed. 06/08/23   Krishnan, Gokul, MD  memantine  (NAMENDA ) 5 MG tablet TAKE 2 TABLETS BY MOUTH EVERY MORNING and TAKE 1 TABLET BY MOUTH EVERY EVENING Patient taking differently: Take 5-10 mg by mouth See admin instructions. Take 10 mg by mouth in the morning and 5 mg in the evening 10/10/22   Catheryn Cluck, MD  montelukast  (SINGULAIR ) 10 MG tablet Take 1 tablet (10 mg total) by mouth at bedtime. 04/24/22   Catheryn Cluck, MD  nitroGLYCERIN  (NITROSTAT ) 0.4 MG SL tablet Take one tablet under the tongue every 5 minutes as needed for chest pain Patient taking differently: Place 0.4 mg under the tongue every 5 (five) minutes as needed for chest pain. 09/24/13   Verma Gobble, NP  nystatin  (MYCOSTATIN /NYSTOP ) powder Apply 1 Application topically 3 (three) times daily. 03/21/23   Gavin Kast, FNP  ondansetron  (ZOFRAN ) 4 MG tablet Take 1 tablet (4 mg total) by mouth every 8 (eight) hours as needed for nausea or vomiting. 06/29/23   Frankie Israel, MD  pantoprazole  (PROTONIX ) 20 MG tablet TAKE 1 TABLET BY MOUTH EVERY DAY 04/19/23   Catheryn Cluck, MD  Semaglutide ,0.25 or 0.5MG /DOS, (OZEMPIC , 0.25 OR 0.5 MG/DOSE,) 2 MG/3ML SOPN Inject 0.5 mg into the skin once a week. 06/21/23   Shamleffer, Julian Obey, MD  sodium zirconium cyclosilicate  (LOKELMA ) 10 g PACK packet Take 10 g by mouth 2 (two) times daily. 09/05/23 10/05/23  Onetha Bile, MD  torsemide  (DEMADEX ) 20 MG tablet TAKE 1 TABLET BY MOUTH ONCE DAILY MAY TAKE AN EXTRA DOES FOR WEIGHT GAIN OF 3LBs IN 1 DAY OR 5LBS IN 1 WEEK OR FOR WORSENING SWELLING 09/06/23   Catheryn Cluck, MD  traMADol  (ULTRAM ) 50 MG tablet Take 1 tablet (50 mg total) by mouth every 12  (twelve) hours as needed. Take the first 4 doses with ZOfran  to minimize nausea 06/29/23   Frankie Israel, MD  TYLENOL  500 MG tablet Take 1,000 mg by mouth every 6 (six) hours as needed for mild pain or headache.    [provider]  TYLENOL  PM EXTRA STRENGTH 500-25 MG TABS tablet Take 2 tablets by mouth at bedtime.    [provider]  Vitamin D , Ergocalciferol , (DRISDOL ) 1.25 MG (50000 UNIT) CAPS capsule TAKE 1 CAPSULE BY MOUTH ONCE WEEKLY 08/09/23   Frankie Israel, MD      Allergies    Sulfonamide derivatives, Duloxetine , Lokelma  [sodium zirconium cyclosilicate ], Aricept  [donepezil  hcl], and Tramadol     Review of Systems   Review of Systems  Neurological:  Positive for headaches.    Physical Exam Updated Vital Signs BP (!) 159/67 (  BP Location: Right Arm)   Pulse 66   Temp 97.6 F (36.4 C) (Oral)   Resp 20   SpO2 100%  Physical Exam  ED Results / Procedures / Treatments   Labs (all labs ordered are listed, but only abnormal results are displayed) Labs Reviewed  BASIC METABOLIC PANEL WITH GFR  CBC WITH DIFFERENTIAL/PLATELET    EKG None  Radiology No results found.  Procedures Procedures  {Document cardiac monitor, telemetry assessment procedure when appropriate:1}  Medications Ordered in ED Medications  ondansetron  (ZOFRAN ) injection 4 mg (has no administration in time range)    ED Course/ Medical Decision Making/ A&P   {   Click here for ABCD2, HEART and other calculatorsREFRESH Note before signing :1}                              Medical Decision Making Amount and/or Complexity of Data Reviewed Labs: ordered. Radiology: ordered.  Risk Prescription drug management.   ***  {Document critical care time when appropriate:1} {Document review of labs and clinical decision tools ie heart score, Chads2Vasc2 etc:1}  {Document your independent review of radiology images, and any outside records:1} {Document your discussion with family  members, caretakers, and with consultants:1} {Document social determinants of health affecting pt's care:1} {Document your decision making why or why not admission, treatments were needed:1} Final Clinical Impression(s) / ED Diagnoses Final diagnoses:  None    Rx / DC Orders ED Discharge Orders     None

## 2023-09-12 NOTE — ED Provider Notes (Signed)
 [place] EMERGENCY DEPARTMENT AT [facility] Provider Note   CSN: [id number] Arrival date & time: 09/12/23  2205     History  Chief Complaint  Patient presents with   Headache   Nausea    [name] is a 89 y.o. female with PMHx anemia, DM, anxiety, depression, chronic pain syndrome, CKD, CAD, OA, dementia, GERD, headaches, HLD, HTN, OSA, polymyalgia rheumatica, vertigo, COPD who presents to ED concerned for headaches and nausea. Patient stating that her headaches became more constant after a fall in 5 months ago. Headache then became daily 3 weeks ago and patient has been taking tylenol  for her headaches with some relief. Patient then took BC powder for her headache today which caused the headache to become more severe. Patient also endorsing vision blurring and nausea with headaches as well. Headache is located on the top of her head. Patient without recent falls or head trauma.   Patient is wheelchair bound.  Denies fever, chest pain, cough, vomiting, diarrhea, dysuria.   Headache      Home Medications Prior to Admission medications   Medication Sig Start Date End Date Taking? Authorizing Provider  predniSONE  (DELTASONE ) 20 MG tablet Take 3 tablets (60 mg total) by mouth daily. 09/13/23 10/13/23 Yes Aspinwall Bureau, PA-C  albuterol  (PROAIR  HFA) 108 (90 Base) MCG/ACT inhaler Inhale 1-2 puffs into the lungs every 6 (six) hours as needed for wheezing or shortness of breath. 12/19/18   Verma Gobble, NP  amitriptyline  (ELAVIL ) 75 MG tablet TAKE 1 TABLET BY MOUTH AT BEDTIME 09/11/23   Catheryn Cluck, MD  atorvastatin  (LIPITOR ) 80 MG tablet TAKE 1 TABLET BY MOUTH EVERY DAY Patient taking differently: Take 80 mg by mouth at bedtime. 12/04/22   Catheryn Cluck, MD  azelastine  (ASTELIN ) 0.1 % nasal spray Place 2 sprays into both nostrils 2 (two) times daily. 01/23/23   Catheryn Cluck, MD  butalbital -acetaminophen -caffeine  (FIORICET ) 50-325-40 MG tablet Take 1  tablet by mouth every 6 (six) hours as needed for headache. 06/08/23   Maylene Spear, MD  carvedilol  (COREG ) 12.5 MG tablet TAKE 1 TABLET BY MOUTH 2 TIMES DAILY 05/17/23   Catheryn Cluck, MD  cetirizine  (ZYRTEC ) 10 MG tablet Take 10 mg by mouth daily as needed for allergies or rhinitis.    [provider]  Continuous Glucose Receiver (FREESTYLE LIBRE 3 READER) DEVI Continuous glucose monitor 06/21/23   Shamleffer, Julian Obey, MD  Continuous Glucose Sensor (FREESTYLE LIBRE 14 DAY SENSOR) MISC PLACE 1 DEVICE ON THE SKIN AS DIRECTED EVERY 14 DAYS 07/16/23   Shamleffer, Julian Obey, MD  diclofenac  Sodium (VOLTAREN ) 1 % GEL Apply 2 g topically 4 (four) times daily. Patient taking differently: Apply 2 g topically 4 (four) times daily as needed (pain). 02/23/22   Catheryn Cluck, MD  docusate sodium  (COLACE) 100 MG capsule Take 2 capsules (200 mg total) by mouth daily. 12/19/22 12/14/23  Catheryn Cluck, MD  ELIQUIS  5 MG TABS tablet TAKE 1 TABLET BY MOUTH 2 TIMES DAILY 04/09/23   Catheryn Cluck, MD  febuxostat  (ULORIC ) 40 MG tablet TAKE 1 TABLET BY MOUTH EVERY DAY 02/22/23   Catheryn Cluck, MD  fluticasone  (FLONASE ) 50 MCG/ACT nasal spray Place 2 sprays into both nostrils daily. 06/12/23   Tonna Frederic, MD  Fluticasone -Umeclidin-Vilant (TRELEGY ELLIPTA ) 100-62.5-25 MCG/ACT AEPB Inhale 1 puff into the lungs daily. 01/10/23 01/05/24  Catheryn Cluck, MD  folic acid  (FOLVITE ) 1 MG tablet TAKE 1 TABLET BY  MOUTH EVERY DAY 05/17/23   Catheryn Cluck, MD  glucagon  1 MG injection Inject 1 mg into the vein once as needed for up to 1 dose. 04/13/22   Catheryn Cluck, MD  glucose 4 GM chewable tablet Chew 1 tablet (4 g total) by mouth as needed for low blood sugar. 04/05/22   Catheryn Cluck, MD  hydrOXYzine  (ATARAX ) 25 MG tablet TAKE 1 TABLET BY MOUTH EVERY 8 HOURS AS NEEDED FOR ANXIETY OR ITCHING (NASAL AND THROAT CONGESTION) 06/05/23   Catheryn Cluck, MD  insulin  aspart (NOVOLOG   FLEXPEN) 100 UNIT/ML FlexPen Max daily 45 units 06/21/23   Shamleffer, Ibtehal Jaralla, MD  insulin  glargine, 2 Unit Dial , (TOUJEO  MAX SOLOSTAR) 300 UNIT/ML Solostar Pen Inject 28 Units into the skin daily before breakfast. 06/21/23   Shamleffer, Ibtehal Jaralla, MD  ipratropium-albuterol  (DUONEB) 0.5-2.5 (3) MG/3ML SOLN Take 3 mLs by nebulization every 6 (six) hours as needed (cough, wheezing, or shortness of breath). 01/10/23 06/05/23  Catheryn Cluck, MD  linaclotide  (LINZESS ) 145 MCG CAPS capsule Patient take 1 capsule daily.  May add an additional capsule for breakthrough constipation associated with opioid use Patient taking differently: Take 145 mcg by mouth every other day. 12/19/22   Catheryn Cluck, MD  meclizine  (ANTIVERT ) 25 MG tablet Take 1 tablet (25 mg total) by mouth 2 (two) times daily. 07/20/23   Catheryn Cluck, MD  melatonin 5 MG TABS Take 1 tablet (5 mg total) by mouth at bedtime as needed. 06/08/23   Krishnan, Gokul, MD  memantine  (NAMENDA ) 5 MG tablet TAKE 2 TABLETS BY MOUTH EVERY MORNING and TAKE 1 TABLET BY MOUTH EVERY EVENING Patient taking differently: Take 5-10 mg by mouth See admin instructions. Take 10 mg by mouth in the morning and 5 mg in the evening 10/10/22   Catheryn Cluck, MD  montelukast  (SINGULAIR ) 10 MG tablet Take 1 tablet (10 mg total) by mouth at bedtime. 04/24/22   Catheryn Cluck, MD  nitroGLYCERIN  (NITROSTAT ) 0.4 MG SL tablet Take one tablet under the tongue every 5 minutes as needed for chest pain Patient taking differently: Place 0.4 mg under the tongue every 5 (five) minutes as needed for chest pain. 09/24/13   Verma Gobble, NP  nystatin  (MYCOSTATIN /NYSTOP ) powder Apply 1 Application topically 3 (three) times daily. 03/21/23   Gavin Kast, FNP  ondansetron  (ZOFRAN ) 4 MG tablet Take 1 tablet (4 mg total) by mouth every 8 (eight) hours as needed for nausea or vomiting. 06/29/23   Frankie Israel, MD  pantoprazole  (PROTONIX ) 20 MG tablet TAKE 1  TABLET BY MOUTH EVERY DAY 04/19/23   Catheryn Cluck, MD  Semaglutide ,0.25 or 0.5MG /DOS, (OZEMPIC , 0.25 OR 0.5 MG/DOSE,) 2 MG/3ML SOPN Inject 0.5 mg into the skin once a week. 06/21/23   Shamleffer, Julian Obey, MD  sodium zirconium cyclosilicate  (LOKELMA ) 10 g PACK packet Take 10 g by mouth 2 (two) times daily. 09/05/23 10/05/23  Onetha Bile, MD  torsemide  (DEMADEX ) 20 MG tablet TAKE 1 TABLET BY MOUTH ONCE DAILY MAY TAKE AN EXTRA DOES FOR WEIGHT GAIN OF 3LBs IN 1 DAY OR 5LBS IN 1 WEEK OR FOR WORSENING SWELLING 09/06/23   Catheryn Cluck, MD  traMADol  (ULTRAM ) 50 MG tablet Take 1 tablet (50 mg total) by mouth every 12 (twelve) hours as needed. Take the first 4 doses with ZOfran  to minimize nausea 06/29/23   Frankie Israel, MD  TYLENOL  500 MG tablet Take 1,000 mg by mouth every 6 (  six) hours as needed for mild pain or headache.    [provider]  TYLENOL  PM EXTRA STRENGTH 500-25 MG TABS tablet Take 2 tablets by mouth at bedtime.    [provider]  Vitamin D , Ergocalciferol , (DRISDOL ) 1.25 MG (50000 UNIT) CAPS capsule TAKE 1 CAPSULE BY MOUTH ONCE WEEKLY 08/09/23   Frankie Israel, MD      Allergies    Sulfonamide derivatives, Duloxetine , Lokelma  [sodium zirconium cyclosilicate ], Aricept  [donepezil  hcl], and Tramadol     Review of Systems   Review of Systems  Neurological:  Positive for headaches.    Physical Exam Updated Vital Signs BP (!) 107/38   Pulse 67   Temp 97.7 F (36.5 C) (Oral)   Resp 16   SpO2 100%  Physical Exam Vitals and nursing note reviewed.  Constitutional:      General: [name] is not in acute distress.    Appearance: [name] is not ill-appearing or toxic-appearing.  HENT:     Head: Normocephalic and atraumatic.     Mouth/Throat:     Mouth: Mucous membranes are moist.     Pharynx: No oropharyngeal exudate or posterior oropharyngeal erythema.  Eyes:     General: No scleral icterus.       Right eye: No discharge.        Left eye: No  discharge.     Conjunctiva/sclera: Conjunctivae normal.  Cardiovascular:     Rate and Rhythm: Normal rate and regular rhythm.     Pulses: Normal pulses.     Heart sounds: No murmur heard. Pulmonary:     Effort: Pulmonary effort is normal. No respiratory distress.     Breath sounds: Normal breath sounds. No wheezing, rhonchi or rales.  Abdominal:     Palpations: Abdomen is soft.  Musculoskeletal:     Right lower leg: No edema.     Left lower leg: No edema.  Skin:    General: Skin is warm and dry.     Findings: No rash.  Neurological:     General: No focal deficit present.     Mental Status: [name] is alert. Mental status is at baseline.     Comments: GCS 15. Speech is goal oriented. No deficits appreciated to CN III-XII; symmetric eyebrow raise, no facial drooping, tongue midline. Patient has equal grip strength bilaterally with 4/5 strength against resistance in all major muscle groups bilaterally. Sensation to light touch intact.    Psychiatric:        Mood and Affect: Mood normal.        Behavior: Behavior normal.     ED Results / Procedures / Treatments   Labs (all labs ordered are listed, but only abnormal results are displayed) Labs Reviewed  BASIC METABOLIC PANEL WITH GFR - Abnormal; Notable for the following components:      Result Value   CO2 21 (*)    Glucose, Bld 170 (*)    BUN 62 (*)    Creatinine, Ser 2.29 (*)    Calcium  8.4 (*)    GFR, Estimated 20 (*)    All other components within normal limits  CBC WITH DIFFERENTIAL/PLATELET - Abnormal; Notable for the following components:   WBC 11.1 (*)    RBC 3.23 (*)    Hemoglobin 9.4 (*)    HCT 31.4 (*)    MCHC 29.9 (*)    All other components within normal limits  URINALYSIS, W/ REFLEX TO CULTURE (INFECTION SUSPECTED) - Abnormal; Notable for the following components:  APPearance HAZY (*)    Leukocytes,Ua SMALL (*)    Bacteria, UA FEW (*)    All other components within normal limits  SEDIMENTATION RATE -  Abnormal; Notable for the following components:   Sed Rate 87 (*)    All other components within normal limits  C-REACTIVE PROTEIN - Abnormal; Notable for the following components:   CRP 1.4 (*)    All other components within normal limits  RESP PANEL BY RT-PCR (RSV, FLU A&B, COVID)  [id number]    EKG None  Radiology CT Head Wo Contrast Result Date: 09/13/2023 CLINICAL DATA:  Increasing headaches EXAM: CT HEAD WITHOUT CONTRAST TECHNIQUE: Contiguous axial images were obtained from the base of the skull through the vertex without intravenous contrast. RADIATION DOSE REDUCTION: This exam was performed according to the departmental dose-optimization program which includes automated exposure control, adjustment of the mA and/or kV according to patient size and/or use of iterative reconstruction technique. COMPARISON:  06/04/2023 FINDINGS: Brain: No evidence of acute infarction, hemorrhage, hydrocephalus, extra-axial collection or mass lesion/mass effect. Chronic atrophic and ischemic changes are noted. Vascular: No hyperdense vessel or unexpected calcification. Skull: Normal. Negative for fracture or focal lesion. Sinuses/Orbits: No acute finding. Other: None IMPRESSION: Chronic atrophic and ischemic changes.  No acute abnormality noted. Electronically Signed   By: Violeta Grey M.D.   On: 09/13/2023 01:29    Procedures Procedures    Medications Ordered in ED Medications  ondansetron  (ZOFRAN ) injection 4 mg (4 mg Intravenous Given 09/12/23 2339)  acetaminophen  (TYLENOL ) tablet 1,000 mg (1,000 mg Oral Given 09/13/23 0135)  predniSONE  (DELTASONE ) tablet 60 mg (60 mg Oral Given 09/13/23 0135)    ED Course/ Medical Decision Making/ A&P                                 Medical Decision Making Amount and/or Complexity of Data Reviewed Labs: ordered. Radiology: ordered.  Risk OTC drugs. Prescription drug management.   This patient presents to the ED for concern of headache, this involves an  extensive number of treatment options, and is a complaint that carries with it a high risk of complications and morbidity.  The differential diagnosis includes migraine, tension headache, cluster headache, subarachnoid hemorrhage, meningitis/encephalitis, acute angle closure glaucoma, giant cell arteritis, idiopathic intercranial hypertension, ischemic stroke, ICH, cervical artery dissection   Co morbidities that complicate the patient evaluation  anemia, DM, anxiety, depression, chronic pain syndrome, CKD, CAD, OA, dementia, GERD, headaches, HLD, HTN, OSA, polymyalgia rheumatica, vertigo, COPD   Additional history obtained:  Additional history obtained from 5/52025 PCP note: patient with "Speech difficulties and muscle twitching have been present for the past two to three weeks. [name] describes her speech as 'jerking' and having trouble getting words out, which her family describes as similar to stuttering. These symptoms have been intermittent over the past few weeks. [name] has a history of right-sided weakness, which has been present for a long time, predating her current headache. There is no recent worsening of this weakness."   Problem List / ED Course / Critical interventions / Medication management  Patient presents to ED with chronic headache. Physical exam reassuring. Patient afebrile with stable vitals. I Ordered, and personally interpreted labs.  BMP with elevated kidney function test at patient's baseline.  Respiratory panel negative.  CBC with mild leukocytosis at 11.1.  There is also anemia with hemoglobin at 9.4 near patient's baseline.  UA with small leukocytes, few bacteria,  and 6-10 squamous epithelial cells -shared decision making with patient who denies dysuria or hematuria or any other urinary complaint and would like to withhold antibiotics at this time. I ordered imaging studies including CT head. I independently visualized and interpreted imaging which showed no acute process. I  agree with the radiologist interpretation I requested consultation with the Neurologist on-call Dr. Alecia Ames,  and discussed lab and imaging findings as well as pertinent plan - they believe patient's symptoms are d/t temporal arteritis and recommends head imaging, 60mg  prednisone  daily x30 days until patient is able to get follow up with outpatient neurology and vascular surgery, and ESR/CRP while in ED. I appreciate his help.  Please see his note for further detail. ESR/CRP elevated. Patient tolerated her first dose of prednisone  well. Patient understands the importance of following up with PCP, neurology, and vascular surgery in the next week. Ambulatory referrals sent out for Neurology and Vasc Surgery. Staffed with Dr. Carie Charity I have reviewed the patients home medicines and have made adjustments as needed The patient has been appropriately medically screened and/or stabilized in the ED. I have low suspicion for any other emergent medical condition which would require further screening, evaluation or treatment in the ED or require inpatient management. At time of discharge the patient is hemodynamically stable and in no acute distress. I have discussed work-up results and diagnosis with patient and answered all questions. Patient is agreeable with discharge plan. We discussed strict return precautions for returning to the emergency department and they verbalized understanding.    Social Determinants of Health:  geriatric          Final Clinical Impression(s) / ED Diagnoses Final diagnoses:  Temporal arteritis (HCC)    Rx / DC Orders ED Discharge Orders          Ordered    Ambulatory referral to Neurology       Comments: An appointment is requested in approximately: 1 week, high concern for temporal arteritis   09/13/23 0010    Ambulatory referral to Vascular Surgery       Comments: Temporal arteritis   09/13/23 0012    TEMPORAL ARTERY        09/13/23 0013    predniSONE   (DELTASONE ) 20 MG tablet  Daily        09/13/23 0014              [place] Bureau, PA-C 09/13/23 0405    Ballard Bongo, MD 09/14/23 [phone]

## 2023-09-12 NOTE — ED Provider Notes (Incomplete)
 [place] [facility] Provider Note   CSN: [id number] Arrival date & time: 09/12/23  2205     History {Add pertinent medical, surgical, social history, OB history to HPI:1} Chief Complaint  Patient presents with   Headache   Nausea    [name] is a 89 y.o. female. Headache x3 weeks. Fall in January and has had headaches since. Headache are daily with blurry vision x3-4 weeks. Patient taking tylenol  for headaches and BC today. Tylenol  helps headaches. Patient wheelchair bound.patient with nausa. Headache on the top of her head.  Denies fever, chest pain, cough, vomiting, diarrhea, dysuria.    Headache      Home Medications Prior to Admission medications   Medication Sig Start Date End Date Taking? Authorizing Provider  albuterol  (PROAIR  HFA) 108 (90 Base) MCG/ACT inhaler Inhale 1-2 puffs into the lungs every 6 (six) hours as needed for wheezing or shortness of breath. 12/19/18   Verma Gobble, NP  amitriptyline  (ELAVIL ) 75 MG tablet TAKE 1 TABLET BY MOUTH AT BEDTIME 09/11/23   Catheryn Cluck, MD  atorvastatin  (LIPITOR ) 80 MG tablet TAKE 1 TABLET BY MOUTH EVERY DAY Patient taking differently: Take 80 mg by mouth at bedtime. 12/04/22   Catheryn Cluck, MD  azelastine  (ASTELIN ) 0.1 % nasal spray Place 2 sprays into both nostrils 2 (two) times daily. 01/23/23   Catheryn Cluck, MD  butalbital -acetaminophen -caffeine  (FIORICET ) 50-325-40 MG tablet Take 1 tablet by mouth every 6 (six) hours as needed for headache. 06/08/23   Krishnan, Gokul, MD  carvedilol  (COREG ) 12.5 MG tablet TAKE 1 TABLET BY MOUTH 2 TIMES DAILY 05/17/23   Catheryn Cluck, MD  cetirizine  (ZYRTEC ) 10 MG tablet Take 10 mg by mouth daily as needed for allergies or rhinitis.    [provider]  Continuous Glucose Receiver (FREESTYLE LIBRE 3 READER) DEVI Continuous glucose monitor 06/21/23   Shamleffer, Julian Obey, MD  Continuous Glucose Sensor (FREESTYLE LIBRE  14 DAY SENSOR) MISC PLACE 1 DEVICE ON THE SKIN AS DIRECTED EVERY 14 DAYS 07/16/23   Shamleffer, Julian Obey, MD  diclofenac  Sodium (VOLTAREN ) 1 % GEL Apply 2 g topically 4 (four) times daily. Patient taking differently: Apply 2 g topically 4 (four) times daily as needed (pain). 02/23/22   Catheryn Cluck, MD  docusate sodium  (COLACE) 100 MG capsule Take 2 capsules (200 mg total) by mouth daily. 12/19/22 12/14/23  Catheryn Cluck, MD  ELIQUIS  5 MG TABS tablet TAKE 1 TABLET BY MOUTH 2 TIMES DAILY 04/09/23   Catheryn Cluck, MD  febuxostat  (ULORIC ) 40 MG tablet TAKE 1 TABLET BY MOUTH EVERY DAY 02/22/23   Catheryn Cluck, MD  fluticasone  (FLONASE ) 50 MCG/ACT nasal spray Place 2 sprays into both nostrils daily. 06/12/23   Tonna Frederic, MD  Fluticasone -Umeclidin-Vilant (TRELEGY ELLIPTA ) 100-62.5-25 MCG/ACT AEPB Inhale 1 puff into the lungs daily. 01/10/23 01/05/24  Catheryn Cluck, MD  folic acid  (FOLVITE ) 1 MG tablet TAKE 1 TABLET BY MOUTH EVERY DAY 05/17/23   Catheryn Cluck, MD  glucagon  1 MG injection Inject 1 mg into the vein once as needed for up to 1 dose. 04/13/22   Catheryn Cluck, MD  glucose 4 GM chewable tablet Chew 1 tablet (4 g total) by mouth as needed for low blood sugar. 04/05/22   Catheryn Cluck, MD  hydrOXYzine  (ATARAX ) 25 MG tablet TAKE 1 TABLET BY MOUTH EVERY 8 HOURS AS NEEDED FOR ANXIETY OR ITCHING (NASAL AND  THROAT CONGESTION) 06/05/23   Catheryn Cluck, MD  insulin  aspart (NOVOLOG  FLEXPEN) 100 UNIT/ML FlexPen Max daily 45 units 06/21/23   Shamleffer, Ibtehal Jaralla, MD  insulin  glargine, 2 Unit Dial , (TOUJEO  MAX SOLOSTAR) 300 UNIT/ML Solostar Pen Inject 28 Units into the skin daily before breakfast. 06/21/23   Shamleffer, Ibtehal Jaralla, MD  ipratropium-albuterol  (DUONEB) 0.5-2.5 (3) MG/3ML SOLN Take 3 mLs by nebulization every 6 (six) hours as needed (cough, wheezing, or shortness of breath). 01/10/23 06/05/23  Catheryn Cluck, MD  linaclotide  (LINZESS ) 145 MCG  CAPS capsule Patient take 1 capsule daily.  May add an additional capsule for breakthrough constipation associated with opioid use Patient taking differently: Take 145 mcg by mouth every other day. 12/19/22   Catheryn Cluck, MD  meclizine  (ANTIVERT ) 25 MG tablet Take 1 tablet (25 mg total) by mouth 2 (two) times daily. 07/20/23   Catheryn Cluck, MD  melatonin 5 MG TABS Take 1 tablet (5 mg total) by mouth at bedtime as needed. 06/08/23   Krishnan, Gokul, MD  memantine  (NAMENDA ) 5 MG tablet TAKE 2 TABLETS BY MOUTH EVERY MORNING and TAKE 1 TABLET BY MOUTH EVERY EVENING Patient taking differently: Take 5-10 mg by mouth See admin instructions. Take 10 mg by mouth in the morning and 5 mg in the evening 10/10/22   Catheryn Cluck, MD  montelukast  (SINGULAIR ) 10 MG tablet Take 1 tablet (10 mg total) by mouth at bedtime. 04/24/22   Catheryn Cluck, MD  nitroGLYCERIN  (NITROSTAT ) 0.4 MG SL tablet Take one tablet under the tongue every 5 minutes as needed for chest pain Patient taking differently: Place 0.4 mg under the tongue every 5 (five) minutes as needed for chest pain. 09/24/13   Eubanks, Jessica K, NP  nystatin  (MYCOSTATIN /NYSTOP ) powder Apply 1 Application topically 3 (three) times daily. 03/21/23   Gavin Kast, FNP  ondansetron  (ZOFRAN ) 4 MG tablet Take 1 tablet (4 mg total) by mouth every 8 (eight) hours as needed for nausea or vomiting. 06/29/23   Frankie Israel, MD  pantoprazole  (PROTONIX ) 20 MG tablet TAKE 1 TABLET BY MOUTH EVERY DAY 04/19/23   Catheryn Cluck, MD  Semaglutide ,0.25 or 0.5MG /DOS, (OZEMPIC , 0.25 OR 0.5 MG/DOSE,) 2 MG/3ML SOPN Inject 0.5 mg into the skin once a week. 06/21/23   Shamleffer, Ibtehal Jaralla, MD  sodium zirconium cyclosilicate  (LOKELMA ) 10 g PACK packet Take 10 g by mouth 2 (two) times daily. 09/05/23 10/05/23  Onetha Bile, MD  torsemide  (DEMADEX ) 20 MG tablet TAKE 1 TABLET BY MOUTH ONCE DAILY MAY TAKE AN EXTRA DOES FOR WEIGHT GAIN OF 3LBs IN 1 DAY OR 5LBS  IN 1 WEEK OR FOR WORSENING SWELLING 09/06/23   Catheryn Cluck, MD  traMADol  (ULTRAM ) 50 MG tablet Take 1 tablet (50 mg total) by mouth every 12 (twelve) hours as needed. Take the first 4 doses with ZOfran  to minimize nausea 06/29/23   Frankie Israel, MD  TYLENOL  500 MG tablet Take 1,000 mg by mouth every 6 (six) hours as needed for mild pain or headache.    [provider]  TYLENOL  PM EXTRA STRENGTH 500-25 MG TABS tablet Take 2 tablets by mouth at bedtime.    [provider]  Vitamin D , Ergocalciferol , (DRISDOL ) 1.25 MG (50000 UNIT) CAPS capsule TAKE 1 CAPSULE BY MOUTH ONCE WEEKLY 08/09/23   Frankie Israel, MD      Allergies    Sulfonamide derivatives, Duloxetine , Lokelma  [sodium zirconium cyclosilicate ], Aricept  [donepezil  hcl], and Tramadol   Review of Systems   Review of Systems  Neurological:  Positive for headaches.    Physical Exam Updated Vital Signs BP (!) 159/67 (BP Location: Right Arm)   Pulse 66   Temp 97.6 F (36.4 C) (Oral)   Resp 20   SpO2 100%  Physical Exam  ED Results / Procedures / Treatments   Labs (all labs ordered are listed, but only abnormal results are displayed) Labs Reviewed  BASIC METABOLIC PANEL WITH GFR  CBC WITH DIFFERENTIAL/PLATELET    EKG None  Radiology No results found.  Procedures Procedures  {Document cardiac monitor, telemetry assessment procedure when appropriate:1}  Medications Ordered in ED Medications  ondansetron  (ZOFRAN ) injection 4 mg (has no administration in time range)    ED Course/ Medical Decision Making/ A&P   {   Click here for ABCD2, HEART and other calculatorsREFRESH Note before signing :1}                              Medical Decision Making Amount and/or Complexity of Data Reviewed Labs: ordered.  Risk Prescription drug management.   ***  {Document critical care time when appropriate:1} {Document review of labs and clinical decision tools ie heart score, Chads2Vasc2  etc:1}  {Document your independent review of radiology images, and any outside records:1} {Document your discussion with family members, caretakers, and with consultants:1} {Document social determinants of health affecting pt's care:1} {Document your decision making why or why not admission, treatments were needed:1} Final Clinical Impression(s) / ED Diagnoses Final diagnoses:  None    Rx / DC Orders ED Discharge Orders     None

## 2023-09-13 ENCOUNTER — Emergency Department (HOSPITAL_COMMUNITY)

## 2023-09-13 DIAGNOSIS — I6782 Cerebral ischemia: Secondary | ICD-10-CM | POA: Diagnosis not present

## 2023-09-13 DIAGNOSIS — M316 Other giant cell arteritis: Secondary | ICD-10-CM | POA: Diagnosis not present

## 2023-09-13 DIAGNOSIS — R519 Headache, unspecified: Secondary | ICD-10-CM | POA: Diagnosis not present

## 2023-09-13 LAB — URINALYSIS, W/ REFLEX TO CULTURE (INFECTION SUSPECTED)
Bilirubin Urine: NEGATIVE
Glucose, UA: NEGATIVE mg/dL
Hgb urine dipstick: NEGATIVE
Ketones, ur: NEGATIVE mg/dL
Nitrite: NEGATIVE
Protein, ur: NEGATIVE mg/dL
Specific Gravity, Urine: 1.008 (ref 1.005–1.030)
pH: 5 (ref 5.0–8.0)

## 2023-09-13 LAB — RESP PANEL BY RT-PCR (RSV, FLU A&B, COVID)  RVPGX2
Influenza A by PCR: NEGATIVE
Influenza B by PCR: NEGATIVE
Resp Syncytial Virus by PCR: NEGATIVE
SARS Coronavirus 2 by RT PCR: NEGATIVE

## 2023-09-13 LAB — C-REACTIVE PROTEIN: CRP: 1.4 mg/dL — ABNORMAL HIGH (ref <1.0)

## 2023-09-13 LAB — SEDIMENTATION RATE: Sed Rate: 87 mm/h — ABNORMAL HIGH (ref 0–22)

## 2023-09-13 MED ORDER — PREDNISONE 20 MG PO TABS: 60.0000 mg | ORAL_TABLET | Freq: Every day | ORAL | 0 refills | Status: DC

## 2023-09-13 MED ORDER — ACETAMINOPHEN 500 MG PO TABS
1000.0000 mg | ORAL_TABLET | Freq: Once | ORAL | Status: AC
  Administered 2023-09-13: 1000 mg via ORAL
  Filled 2023-09-13: qty 2

## 2023-09-13 MED ORDER — PREDNISONE 20 MG PO TABS
60.0000 mg | ORAL_TABLET | Freq: Once | ORAL | Status: AC
Start: 2023-09-13 — End: 2023-09-13
  Administered 2023-09-13: 60 mg via ORAL
  Filled 2023-09-13: qty 3

## 2023-09-13 NOTE — Discharge Instructions (Addendum)
 It was a pleasure caring for you today.  We have sent ambulatory referral to neurology and vascular surgery.  We also scheduled you a temporal artery ultrasound.  These offices should reach out to you in the next 48 hours.  If they do not please call the number provided in this discharge paperwork.  I have sent your prednisone  to your pharmacy please take 60mg  daily.  Seek emergency care if experiencing any new or worsening symptoms.

## 2023-09-13 NOTE — Consult Note (Signed)
 NEUROLOGY CONSULT NOTE   Date of service: Sep 13, 2023 Patient Name: [name] MRN:  [id number] DOB:  Mar 19, 1935 Chief Complaint: "Headaches" Requesting Provider: Ballard Bongo, *  History of Present Illness  [name] is a 89 y.o. female with hx of polymyalgia rheumatica, CKD, diabetes, DVT(on Eliquis ), dementia who presents with worsening headaches over the past couple months, much worse over the past 3 weeks.  The headache is on the top of her head and involving the left side of her head, does come and go but is been progressively worsening.  She has attributed this due to a fall several months ago, however it is continuing to worsen as opposed to improved.  She also has noticed double vision that has been ongoing over a couple of months.  This is only present with distant vision, not with near vision.  She denies jaw claudication  She endorses myalgias, particularly hip girdle pain.   Past History   Past Medical History:  Diagnosis Date   Abdominal pain 01/26/2012   Acute deep vein thrombosis (DVT) of femoral vein of right lower extremity (HCC) 03/26/2022   Acute kidney injury superimposed on chronic kidney disease (HCC) 12/18/2014   Acute upper GI bleed 03/26/2022   Allergy     takes Mucinex  daily as needed   Anemia, unspecified    Anxiety    takes Clonazepam  daily as needed   Anxiety associated with depression 06/08/2017   Arthritis    Breast cancer (HCC)    Breast cancer screening 02/19/2014   Breast mass 10/27/2014   Chronic diastolic CHF (congestive heart failure) (HCC)    takes Furosemide  daily   Chronic pain syndrome 02/06/2015   CKD (chronic kidney disease), stage III (HCC)    Coronary artery disease    a. s/p IMI 2004 tx with BMS to RCA;  b. s/p Promus DES to RCA 2/12 c. abnormal nuc 2016 -> cath with med rx. d. NSTEMI 02/2020  mv CAD with severe ISR in pRCA and m/dRCA lesion both treated with DES.   Debility 08/01/2012   Dementia (HCC)     Depression    takes Cymbalta  daily   Diabetes mellitus    insulin  daily   DOE (dyspnea on exertion) 06/24/2014   Dysphagia, unspecified(787.20) 07/31/2012   Fungal dermatitis 11/13/2007   Qualifier: Diagnosis of  By: Minnette Amato  MD, Ronie Cohen    GERD (gastroesophageal reflux disease)    takes Protonix  daily   Gout, unspecified    Headache    occasionally   History of anemia due to chronic kidney disease 03/26/2022   History of blood clots    History of deep venous thrombosis 01/27/2012   History of pulmonary embolus (PE) 04/22/2014   Hyperkalemia 07/26/2017   Hyperlipidemia    takes Pravastatin  daily   Hypertension    Insomnia    Joint pain    Joint swelling    Morbid obesity (HCC)    Multiple benign nevi 11/15/2016   Myocardial infarction (HCC) 2004   Non-STEMI (non-ST elevated myocardial infarction) (HCC) 02/15/2020   Numbness    Obstructive sleep apnea    does not wear cpap   Osteoarthritis    Osteoarthritis    Osteoarthrosis, unspecified whether generalized or localized, lower leg    Pain in the chest 12/31/2013   Pain, chronic    Personal history of other diseases of the digestive system 12/03/2006   Qualifier: Diagnosis of  By: Minnette Amato  MD, Ronie Cohen  Polymyalgia rheumatica (HCC)    Presence of stent in right coronary artery    Pulmonary emboli (HCC) 01/2012   felt to need lifelong anticoagulation but Xarelto  dc in 02/2020 due to need for DAPT   Situational depression 06/04/2009   Qualifier: Diagnosis of  By: Minnette Amato  MD, Ronie Cohen    Syncope, vasovagal 07/04/2021   Type 2 diabetes mellitus with hyperlipidemia (HCC) 02/16/2022   Urinary incontinence    takes Linzess  daily   Vaginitis and vulvovaginitis 06/23/2016   Vertigo    hx of;was taking Meclizine  if needed   Wears dentures     Past Surgical History:  Procedure Laterality Date   ABDOMINAL HYSTERECTOMY     partial   APPENDECTOMY     blood clots/legs and lungs  2013   BREAST BIOPSY Left  07/22/2014   BREAST BIOPSY Left 02/10/2013   BREAST LUMPECTOMY Left 11/05/2014   BREAST LUMPECTOMY WITH RADIOACTIVE SEED LOCALIZATION Left 11/05/2014   Procedure: LEFT BREAST LUMPECTOMY WITH RADIOACTIVE SEED LOCALIZATION;  Surgeon: Oza Blumenthal, MD;  Location: [facility] OR;  Service: General;  Laterality: Left;   CARDIAC CATHETERIZATION     COLONOSCOPY     CORONARY ANGIOPLASTY  2   CORONARY STENT INTERVENTION N/A 02/17/2020   Procedure: CORONARY STENT INTERVENTION;  Surgeon: Arleen Lacer, MD;  Location: [facility] INVASIVE CV LAB;  Service: Cardiovascular;  Laterality: N/A;   ESOPHAGOGASTRODUODENOSCOPY (EGD) WITH PROPOFOL  N/A 11/07/2016   Procedure: ESOPHAGOGASTRODUODENOSCOPY (EGD) WITH PROPOFOL ;  Surgeon: Kenney Peacemaker, MD;  Location: [facility] ENDOSCOPY;  Service: Endoscopy;  Laterality: N/A;   ESOPHAGOGASTRODUODENOSCOPY (EGD) WITH PROPOFOL  N/A 01/05/2023   Procedure: ESOPHAGOGASTRODUODENOSCOPY (EGD) WITH PROPOFOL ;  Surgeon: Tobin Forts, MD;  Location: [facility] ENDOSCOPY;  Service: Gastroenterology;  Laterality: N/A;   EXCISION OF SKIN TAG Right 11/05/2014   Procedure: EXCISION OF RIGHT EYELID SKIN TAG;  Surgeon: Oza Blumenthal, MD;  Location: [facility] OR;  Service: General;  Laterality: Right;   EYE SURGERY Bilateral    cataract    GASTRIC BYPASS  1977    reversed in 1979, [facility]   LEFT HEART CATH AND CORONARY ANGIOGRAPHY N/A 02/17/2020   Procedure: LEFT HEART CATH AND CORONARY ANGIOGRAPHY;  Surgeon: Arleen Lacer, MD;  Location: [facility] INVASIVE CV LAB;  Service: Cardiovascular;  Laterality: N/A;   LEFT HEART CATHETERIZATION WITH CORONARY ANGIOGRAM N/A 06/29/2014   Procedure: LEFT HEART CATHETERIZATION WITH CORONARY ANGIOGRAM;  Surgeon: Millicent Ally, MD;  Location: [facility] CATH LAB;  Service: Cardiovascular;  Laterality: N/A;   MEMBRANE PEEL Right 10/23/2018   Procedure: MEMBRANE PEEL;  Surgeon: Shon Downing, MD;  Location: [facility] OR;  Service: Ophthalmology;  Laterality: Right;   MI with stent placement  2004    PARS PLANA VITRECTOMY Right 10/23/2018   Procedure: PARS PLANA VITRECTOMY WITH 25 GAUGE;  Surgeon: Shon Downing, MD;  Location: [facility] OR;  Service: Ophthalmology;  Laterality: Right;    Family History: Family History  Problem Relation Age of Onset   Breast cancer Mother 44   Heart disease Mother    Throat cancer Father    Hypertension Father    Arthritis Father    Diabetes Father    Arthritis Sister    Obesity Sister    Diabetes Sister    Heart disease Cousin    Colon cancer Neg Hx    Stomach cancer Neg Hx    Esophageal cancer Neg Hx     Social History  reports that she has never smoked. She has been exposed  to tobacco smoke. She has never used smokeless tobacco. She reports that she does not drink alcohol and does not use drugs.  Allergies  Allergen Reactions   Sulfonamide Derivatives Swelling and Other (See Comments)    Mouth swelling- no resp issues noted   Duloxetine      Headaches   Lokelma  [Sodium Zirconium Cyclosilicate ] Other (See Comments)    Headache, stomach upset   Aricept  [Donepezil  Hcl] Diarrhea, Nausea And Vomiting and Other (See Comments)    GI upset/loose stools   Tramadol  Nausea And Vomiting    Medications  No current facility-administered medications for this encounter.  Current Outpatient Medications:    predniSONE  (DELTASONE ) 20 MG tablet, Take 3 tablets (60 mg total) by mouth daily., Disp: 90 tablet, Rfl: 0   albuterol  (PROAIR  HFA) 108 (90 Base) MCG/ACT inhaler, Inhale 1-2 puffs into the lungs every 6 (six) hours as needed for wheezing or shortness of breath., Disp: 6.7 g, Rfl: 1   amitriptyline  (ELAVIL ) 75 MG tablet, TAKE 1 TABLET BY MOUTH AT BEDTIME, Disp: 90 tablet, Rfl: 0   atorvastatin  (LIPITOR ) 80 MG tablet, TAKE 1 TABLET BY MOUTH EVERY DAY (Patient taking differently: Take 80 mg by mouth at bedtime.), Disp: 90 tablet, Rfl: 3   azelastine  (ASTELIN ) 0.1 % nasal spray, Place 2 sprays into both nostrils 2 (two) times daily., Disp: 30 mL, Rfl: 12    butalbital -acetaminophen -caffeine  (FIORICET ) 50-325-40 MG tablet, Take 1 tablet by mouth every 6 (six) hours as needed for headache., Disp: 20 tablet, Rfl: 0   carvedilol  (COREG ) 12.5 MG tablet, TAKE 1 TABLET BY MOUTH 2 TIMES DAILY, Disp: 180 tablet, Rfl: 1   cetirizine  (ZYRTEC ) 10 MG tablet, Take 10 mg by mouth daily as needed for allergies or rhinitis., Disp: , Rfl:    Continuous Glucose Receiver (FREESTYLE LIBRE 3 READER) DEVI, Continuous glucose monitor, Disp: 1 each, Rfl: 0   Continuous Glucose Sensor (FREESTYLE LIBRE 14 DAY SENSOR) MISC, PLACE 1 DEVICE ON THE SKIN AS DIRECTED EVERY 14 DAYS, Disp: 6 each, Rfl: 3   diclofenac  Sodium (VOLTAREN ) 1 % GEL, Apply 2 g topically 4 (four) times daily. (Patient taking differently: Apply 2 g topically 4 (four) times daily as needed (pain).), Disp: 150 g, Rfl: 2   docusate sodium  (COLACE) 100 MG capsule, Take 2 capsules (200 mg total) by mouth daily., Disp: 180 capsule, Rfl: 3   ELIQUIS  5 MG TABS tablet, TAKE 1 TABLET BY MOUTH 2 TIMES DAILY, Disp: 180 tablet, Rfl: 3   febuxostat  (ULORIC ) 40 MG tablet, TAKE 1 TABLET BY MOUTH EVERY DAY, Disp: 90 tablet, Rfl: 3   fluticasone  (FLONASE ) 50 MCG/ACT nasal spray, Place 2 sprays into both nostrils daily., Disp: 16 g, Rfl: 6   Fluticasone -Umeclidin-Vilant (TRELEGY ELLIPTA ) 100-62.5-25 MCG/ACT AEPB, Inhale 1 puff into the lungs daily., Disp: 1 each, Rfl: 11   folic acid  (FOLVITE ) 1 MG tablet, TAKE 1 TABLET BY MOUTH EVERY DAY, Disp: 90 tablet, Rfl: 1   glucagon  1 MG injection, Inject 1 mg into the vein once as needed for up to 1 dose., Disp: 1 each, Rfl: 12   glucose 4 GM chewable tablet, Chew 1 tablet (4 g total) by mouth as needed for low blood sugar., Disp: 50 tablet, Rfl: 12   hydrOXYzine  (ATARAX ) 25 MG tablet, TAKE 1 TABLET BY MOUTH EVERY 8 HOURS AS NEEDED FOR ANXIETY OR ITCHING (NASAL AND THROAT CONGESTION), Disp: 120 tablet, Rfl: 0   insulin  aspart (NOVOLOG  FLEXPEN) 100 UNIT/ML FlexPen, Max daily 45 units, Disp:  45 mL, Rfl: 4   insulin  glargine, 2 Unit Dial , (TOUJEO  MAX SOLOSTAR) 300 UNIT/ML Solostar Pen, Inject 28 Units into the skin daily before breakfast., Disp: 15 mL, Rfl: 3   ipratropium-albuterol  (DUONEB) 0.5-2.5 (3) MG/3ML SOLN, Take 3 mLs by nebulization every 6 (six) hours as needed (cough, wheezing, or shortness of breath)., Disp: 360 mL, Rfl: 0   linaclotide  (LINZESS ) 145 MCG CAPS capsule, Patient take 1 capsule daily.  May add an additional capsule for breakthrough constipation associated with opioid use (Patient taking differently: Take 145 mcg by mouth every other day.), Disp: 180 capsule, Rfl: 1   meclizine  (ANTIVERT ) 25 MG tablet, Take 1 tablet (25 mg total) by mouth 2 (two) times daily., Disp: 180 tablet, Rfl: 1   melatonin 5 MG TABS, Take 1 tablet (5 mg total) by mouth at bedtime as needed., Disp: 15 tablet, Rfl: 0   memantine  (NAMENDA ) 5 MG tablet, TAKE 2 TABLETS BY MOUTH EVERY MORNING and TAKE 1 TABLET BY MOUTH EVERY EVENING (Patient taking differently: Take 5-10 mg by mouth See admin instructions. Take 10 mg by mouth in the morning and 5 mg in the evening), Disp: 270 tablet, Rfl: 5   montelukast  (SINGULAIR ) 10 MG tablet, Take 1 tablet (10 mg total) by mouth at bedtime., Disp: 30 tablet, Rfl: 3   nitroGLYCERIN  (NITROSTAT ) 0.4 MG SL tablet, Take one tablet under the tongue every 5 minutes as needed for chest pain (Patient taking differently: Place 0.4 mg under the tongue every 5 (five) minutes as needed for chest pain.), Disp: 50 tablet, Rfl: 0   nystatin  (MYCOSTATIN /NYSTOP ) powder, Apply 1 Application topically 3 (three) times daily., Disp: 60 g, Rfl: 3   ondansetron  (ZOFRAN ) 4 MG tablet, Take 1 tablet (4 mg total) by mouth every 8 (eight) hours as needed for nausea or vomiting., Disp: 20 tablet, Rfl: 0   pantoprazole  (PROTONIX ) 20 MG tablet, TAKE 1 TABLET BY MOUTH EVERY DAY, Disp: 30 tablet, Rfl: 5   Semaglutide ,0.25 or 0.5MG /DOS, (OZEMPIC , 0.25 OR 0.5 MG/DOSE,) 2 MG/3ML SOPN, Inject 0.5 mg  into the skin once a week., Disp: 9 mL, Rfl: 3   sodium zirconium cyclosilicate  (LOKELMA ) 10 g PACK packet, Take 10 g by mouth 2 (two) times daily., Disp: 60 packet, Rfl: 0   torsemide  (DEMADEX ) 20 MG tablet, TAKE 1 TABLET BY MOUTH ONCE DAILY MAY TAKE AN EXTRA DOES FOR WEIGHT GAIN OF 3LBs IN 1 DAY OR 5LBS IN 1 WEEK OR FOR WORSENING SWELLING, Disp: 30 tablet, Rfl: 0   traMADol  (ULTRAM ) 50 MG tablet, Take 1 tablet (50 mg total) by mouth every 12 (twelve) hours as needed. Take the first 4 doses with ZOfran  to minimize nausea, Disp: 30 tablet, Rfl: 0   TYLENOL  500 MG tablet, Take 1,000 mg by mouth every 6 (six) hours as needed for mild pain or headache., Disp: , Rfl:    TYLENOL  PM EXTRA STRENGTH 500-25 MG TABS tablet, Take 2 tablets by mouth at bedtime., Disp: , Rfl:    Vitamin D , Ergocalciferol , (DRISDOL ) 1.25 MG (50000 UNIT) CAPS capsule, TAKE 1 CAPSULE BY MOUTH ONCE WEEKLY, Disp: 4 capsule, Rfl: 1  Vitals   Vitals:   09/12/23 2209 09/12/23 2211  BP:  (!) 159/67  Pulse:  66  Resp:  20  Temp:  97.6 F (36.4 C)  TempSrc:  Oral  SpO2: 97% 100%    There is no height or weight on file to calculate BMI.  Physical Exam   Constitutional: Appears well-developed and well-nourished.  Neurologic Examination    Neuro: Mental Status: Patient is awake, alert, oriented to person, place, month, year, and situation. Patient is able to give a clear and coherent history. No signs of aphasia or neglect Cranial Nerves: II: Visual Fields are full. Pupils are equal, round, and reactive to light.   III,IV, VI: EOMI without ptosis or diploplia.  I am not able to appreciate a disconjugate gaze. V: Facial sensation is symmetric to temperature VII: Facial movement is symmetric.  VIII: hearing is intact to voice X: Uvula elevates symmetrically XII: tongue is midline without atrophy or fasciculations.  Motor: Tone is normal. Bulk is normal. 5/5 strength was present in all four extremities.   Sensory: Sensation is symmetric to light touch and temperature in the arms and legs. Cerebellar: FNF and HKS are intact bilaterally        Labs/Imaging/Neurodiagnostic studies   CBC:  Recent Labs  Lab 09/14/2023 1405 09/12/23 2215  WBC 8.6 11.1*  NEUTROABS 4.8 6.2  HGB 9.3* 9.4*  HCT 30.0* 31.4*  MCV 91.7 97.2  PLT 235 217   Basic Metabolic Panel:  Lab Results  Component Value Date   NA 136 09/12/2023   K 4.4 09/12/2023   CO2 21 (L) 09/12/2023   GLUCOSE 170 (H) 09/12/2023   BUN 62 (H) 09/12/2023   CREATININE 2.29 (H) 09/12/2023   CALCIUM  8.4 (L) 09/12/2023   GFRNONAA 20 (L) 09/12/2023   GFRAA 38 (L) 03/02/2020   Lipid Panel:  Lab Results  Component Value Date   LDLCALC 34 03/05/2023   HgbA1c:  Lab Results  Component Value Date   HGBA1C 7.7 (A) 06/21/2023   Urine Drug Screen:     Component Value Date/Time   LABOPIA negative 03/31/2019 1620   COCAINSCRNUR negative 03/31/2019 1620   LABBENZ negative 03/31/2019 1620   LABBARB negative 03/31/2019 1620    Alcohol Level No results found for: "ETH" INR  Lab Results  Component Value Date   INR 1.8 (H) 03/27/2022   APTT  Lab Results  Component Value Date   APTT 69 (H) 03/28/2022    ASSESSMENT   [name] is a 89 y.o. female with worsening headaches, myalgias, new diplopia, asymmetric temporal pulses, history of polymyalgia rheumatica, all of which combined are highly concerning for temporal arteritis.  I would recommend repeating ESR and CRP, if they are significantly elevated I would favor starting prednisone .  I discussed possible admission for expediting the studies, as well as getting an MRI/MRA/MRV.  The patient is very hesitant to get any type of MRI imaging, stating that she has had it in the past and it is very difficult for her.  I discussed that there is risk involved and the possibility of missing possible diagnoses by not getting this, though my suspicion is not extremely high that this  represents pathology that would show up on MRI.  The patient expresses understanding that there is risk involved and still wishes to not pursue MRI or admission at this time.  I think that this is reasonable.  RECOMMENDATIONS  Head CT ESR and CRP If ESR and CRP are elevated and head CT is negative, would start prednisone  60 mg daily for 2 to 4 weeks with down titration guided by outpatient neurology or PCP. Outpatient temporal artery ultrasound, and referral to vascular surgery for consideration of temporal artery biopsy(if ultrasound is negative) Outpatient neurology referral ______________________________________________________________________   Signed, Ann Keto, MD [facility]

## 2023-09-17 DIAGNOSIS — K219 Gastro-esophageal reflux disease without esophagitis: Secondary | ICD-10-CM | POA: Diagnosis not present

## 2023-09-17 DIAGNOSIS — R0989 Other specified symptoms and signs involving the circulatory and respiratory systems: Secondary | ICD-10-CM | POA: Diagnosis not present

## 2023-09-17 DIAGNOSIS — M316 Other giant cell arteritis: Secondary | ICD-10-CM | POA: Diagnosis not present

## 2023-09-18 ENCOUNTER — Encounter: Payer: Self-pay | Admitting: Nephrology

## 2023-09-19 ENCOUNTER — Telehealth: Payer: Self-pay | Admitting: Cardiovascular Disease

## 2023-09-19 NOTE — Telephone Encounter (Signed)
*  STAT* If patient is at the pharmacy, call can be transferred to refill team.   1. Which medications need to be refilled? (please list name of each medication and dose if known)   nitroGLYCERIN  (NITROSTAT ) 0.4 MG SL tablet   2. Which pharmacy/location (including street and city if local pharmacy) is medication to be sent to? [facility] - [place], [place] - [address] Phone: [phone]  Fax: [phone]      3. Do they need a 30 day or 90 day supply? 30

## 2023-09-19 NOTE — Telephone Encounter (Signed)
 Dr. [name] Pt. Requesting a refill of Nitroglycerin . Last refill was sent in 2015. I'm not sure how to proceed with her requesting a new RX after this long. Please address.

## 2023-09-20 ENCOUNTER — Ambulatory Visit (INDEPENDENT_AMBULATORY_CARE_PROVIDER_SITE_OTHER): Admitting: Podiatry

## 2023-09-20 ENCOUNTER — Encounter: Payer: Self-pay | Admitting: Podiatry

## 2023-09-20 ENCOUNTER — Ambulatory Visit: Admitting: Podiatry

## 2023-09-20 DIAGNOSIS — M79609 Pain in unspecified limb: Secondary | ICD-10-CM

## 2023-09-20 DIAGNOSIS — B351 Tinea unguium: Secondary | ICD-10-CM | POA: Diagnosis not present

## 2023-09-20 DIAGNOSIS — M21969 Unspecified acquired deformity of unspecified lower leg: Secondary | ICD-10-CM | POA: Diagnosis not present

## 2023-09-20 DIAGNOSIS — E1169 Type 2 diabetes mellitus with other specified complication: Secondary | ICD-10-CM

## 2023-09-20 MED ORDER — NITROGLYCERIN 0.4 MG SL SUBL: SUBLINGUAL_TABLET | SUBLINGUAL | 6 refills | Status: AC

## 2023-09-20 NOTE — Progress Notes (Signed)
 This patient returns to my office for at risk foot care.  This patient requires this care by a professional since this patient will be at risk due to having diabetes and CKD. She presents to the office  in a wheelchair with a female caregiver. This patient is unable to cut nails herself since the patient cannot reach her nails.These nails are painful walking and wearing shoes.  This patient presents for at risk foot care today.  General Appearance  Alert, conversant and in no acute stress.  Vascular  Dorsalis pedis and posterior tibial  pulses are  weakly palpable  bilaterally.  Capillary return is within normal limits  bilaterally. Temperature is within normal limits  bilaterally.  Neurologic  Senn-Weinstein monofilament wire test within normal limits  bilaterally. Muscle power within normal limits bilaterally.  Nails Thick disfigured discolored nails with subungual debris  from hallux to fifth toes bilaterally. No evidence of bacterial infection or drainage bilaterally.  Orthopedic  No limitations of motion  feet .  No crepitus or effusions noted.  No bony pathology or digital deformities noted. Deviation of toes 2-5 right foot at the MPJ.  Skin  normotropic skin with no porokeratosis noted bilaterally.  No signs of infections or ulcers noted.     Onychomycosis  Pain in right toes  Pain in left toes  Consent was obtained for treatment procedures.   Mechanical debridement of nails 1-5  bilaterally performed with a nail nipper.  Filed with dremel without incident.    Return office visit  16  weeks                  Told patient to return for periodic foot care and evaluation due to potential at risk complications.    Ruffin Cotton DPM

## 2023-09-20 NOTE — Telephone Encounter (Signed)
 Pt last seen 03/05/23 by Dr Alroy Aspen with NTG on active medication list and advised to follow up in 1 year. Will send refill at this time.

## 2023-09-21 ENCOUNTER — Telehealth: Payer: Self-pay

## 2023-09-25 ENCOUNTER — Ambulatory Visit
Admission: RE | Admit: 2023-09-25 | Discharge: 2023-09-25 | Disposition: A | Discharge status: Home / Self Care | Source: Ambulatory Visit | Attending: Internal Medicine | Admitting: Internal Medicine

## 2023-09-25 ENCOUNTER — Ambulatory Visit: Admitting: Neurology

## 2023-09-25 ENCOUNTER — Encounter: Payer: Self-pay | Admitting: Neurology

## 2023-09-25 VITALS — BP 105/83 | HR 83 | Resp 15 | Ht 67.0 in | Wt 265.0 lb

## 2023-09-25 DIAGNOSIS — I6782 Cerebral ischemia: Secondary | ICD-10-CM | POA: Diagnosis not present

## 2023-09-25 DIAGNOSIS — J449 Chronic obstructive pulmonary disease, unspecified: Secondary | ICD-10-CM | POA: Diagnosis not present

## 2023-09-25 DIAGNOSIS — R52 Pain, unspecified: Secondary | ICD-10-CM | POA: Insufficient documentation

## 2023-09-25 DIAGNOSIS — R519 Headache, unspecified: Secondary | ICD-10-CM

## 2023-09-25 DIAGNOSIS — R531 Weakness: Secondary | ICD-10-CM

## 2023-09-25 DIAGNOSIS — R4789 Other speech disturbances: Secondary | ICD-10-CM

## 2023-09-25 DIAGNOSIS — R42 Dizziness and giddiness: Secondary | ICD-10-CM | POA: Diagnosis not present

## 2023-09-25 MED ORDER — PREDNISONE 10 MG PO TABS: 40.0000 mg | ORAL_TABLET | Freq: Every day | ORAL | 6 refills | Status: DC

## 2023-09-25 NOTE — Progress Notes (Signed)
 Chief Complaint  Patient presents with   New Patient (Initial Visit)    Emgrm3, daughter and caregiver present, internal referral/concern for temporal arteritis:pt stated that she has frequent ha's daily for the past 30 days. Triggers: unidentifiable. Ha's are constant with varying instensities. Also reports dizziness with nausea. Unable to do othostatic bp as pt cannot stand       ASSESSMENT AND PLAN  [name] is a 89 y.o. female   Complicated past medical history, presenting with few months history of intermittent headaches,  Chronic elevated ESR, C-reactive protein, in the setting of anemia, polymyalgia rheumatica, chronic kidney disease, severe deconditioning, not necessarily indicating the temporal arteritis, she has been treated with prednisone  60 mg for over 2 weeks, significant improvement of body achy pain and headaches, I doubt temporal arteritis biopsy now would be high yield,  Will repeat laboratory evaluations, start tapering down prednisone , if she has recurrent headaches, can consider biopsy then,   Will taper down prednisone  10 mg decrement weekly stay at 10 mg daily  Return To Clinic In3  Months    DIAGNOSTIC DATA (LABS, IMAGING, TESTING) - I reviewed patient records, labs, notes, testing and imaging myself where available.   MEDICAL HISTORY:  [name], is a 89 year old female, accompanied by her daughter, caregiver, seen in request by neurohospitalist Ann Keto to follow-up on her most recent ED visit for new onset headache, her primary care is Dr. Hildy Lowers, [place]   History is obtained from the patient and review of electronic medical records. I personally reviewed pertinent available imaging films in PACS.   PMHx of  DM DM PN. Right leg DVT-on Eliquis  Hx of left breast cancer,  CAD HTN HLD GERD Obesity Depression anxiety,  Polymyalgia rheumatica  She presented to emergency room on Sep 13, 2023 for worsening headache over the  past 3 weeks, on top of her head, intermittent, seems to getting worse after a fall several months ago, but she denies significant jaw claudication, no swallowing difficulty, intermittent blurry vision  She has significant upper and lower extremity pain, daughter has lived with her for 15 years, she has been nonambulatory over past 15 years, able to help with transfer, but spent most of the time lying down or sitting down, significant body achy pain, carries a diagnosis of polymyalgia rheumatica  During her emergency room stay, she was found to have significant elevated ESR of 87, C-reactive protein 1.4, highly concerning for temporal arteritis, she was started on prednisone  20 mg 3 tablets every morning, soon after that, her symptoms has much improved, she denies significant headaches now, but complains of difficulty swallowing pills, her muscle achy and joints pain has much improved with steroid treatment  CT head on Sep 13, 2023 showed chronic atrophic and ischemic change, no acute abnormality  CT cervical spine on June 04, 2023 showed multilevel degenerative changes, no acute abnormality,   Reviewed extensive laboratory evaluation, elevated ESR 87 actually relatively improved compared to previous number of 103 in January 2025, 110 in May 2016, her ESR has been elevated since 2010,  C-reactive protein 1.4 is also improved compared to previous number of 4 in September 2020, 10.4  In addition she has a long history of anemia, hemoglobin stay between 8.2-9.4, which can also cause elevated ESR  She has abnormal kidney function creatinine of 2.3, GFR of 20, BUN of 66 which can contributed to the elevation of inflammatory marker as well, her ferritin level was 258, vitamin D  79  PHYSICAL EXAM:   Vitals:   09/25/23 1323  BP: 105/83  Pulse: 83  Resp: 15  SpO2: 99%  Weight: 264 lb 15.9 oz (120.2 kg)  Height: 5\' 7"  (1.702 m)     Body mass index is 41.5 kg/m.  PHYSICAL  EXAMNIATION:  Gen: NAD, conversant, well nourised, well groomed                     Cardiovascular: Regular rate rhythm, no peripheral edema, warm, nontender. Eyes: Conjunctivae clear without exudates or hemorrhage Neck: Supple, no carotid bruits. Pulmonary: Clear to auscultation bilaterally   NEUROLOGICAL EXAM:  MENTAL STATUS: Speech/cognition: Obese, in wheelchair, awake, alert, oriented to history taking and casual conversation CRANIAL NERVES: CN II: Visual fields are full to confrontation. Pupils are round equal and briskly reactive to light. CN III, IV, VI: extraocular movement are normal. No ptosis. CN V: Facial sensation is intact to light touch CN VII: Face is symmetric with normal eye closure  CN VIII: Hearing is normal to causal conversation. CN IX, X: Phonation is normal. CN XI: Head turning and shoulder shrug are intact  MOTOR: gave away weakness of bilateral upper and lower extremity proximal and distal muscles  REFLEXES: Reflexes are 2  and symmetric at the biceps, triceps, absent at knees, and ankles. Plantar responses are flexor.  SENSORY: Length dependent sensory loss to vibratory, pinprick to mid shin level  COORDINATION: There is no trunk or limb dysmetria noted.  GAIT/STANCE: Deferred, chairwalk in her wheelchair  REVIEW OF SYSTEMS:  Full 14 system review of systems performed and notable only for as above All other review of systems were negative.   ALLERGIES: Allergies  Allergen Reactions   Sulfonamide Derivatives Swelling and Other (See Comments)    Mouth swelling- no resp issues noted   Duloxetine      Headaches   Lokelma  [Sodium Zirconium Cyclosilicate ] Other (See Comments)    Headache, stomach upset   Aricept  [Donepezil  Hcl] Diarrhea, Nausea And Vomiting and Other (See Comments)    GI upset/loose stools   Tramadol  Nausea And Vomiting    HOME MEDICATIONS: Current Outpatient Medications  Medication Sig Dispense Refill   albuterol  (PROAIR   HFA) 108 (90 Base) MCG/ACT inhaler Inhale 1-2 puffs into the lungs every 6 (six) hours as needed for wheezing or shortness of breath. 6.7 g 1   amitriptyline  (ELAVIL ) 75 MG tablet TAKE 1 TABLET BY MOUTH AT BEDTIME 90 tablet 0   atorvastatin  (LIPITOR ) 80 MG tablet TAKE 1 TABLET BY MOUTH EVERY DAY (Patient taking differently: Take 80 mg by mouth at bedtime.) 90 tablet 3   azelastine  (ASTELIN ) 0.1 % nasal spray Place 2 sprays into both nostrils 2 (two) times daily. 30 mL 12   butalbital -acetaminophen -caffeine  (FIORICET ) 50-325-40 MG tablet Take 1 tablet by mouth every 6 (six) hours as needed for headache. 20 tablet 0   carvedilol  (COREG ) 12.5 MG tablet TAKE 1 TABLET BY MOUTH 2 TIMES DAILY 180 tablet 1   cetirizine  (ZYRTEC ) 10 MG tablet Take 10 mg by mouth daily as needed for allergies or rhinitis.     Continuous Glucose Receiver (FREESTYLE LIBRE 3 READER) DEVI Continuous glucose monitor 1 each 0   Continuous Glucose Sensor (FREESTYLE LIBRE 14 DAY SENSOR) MISC PLACE 1 DEVICE ON THE SKIN AS DIRECTED EVERY 14 DAYS 6 each 3   diclofenac  Sodium (VOLTAREN ) 1 % GEL Apply 2 g topically 4 (four) times daily. (Patient taking differently: Apply 2 g topically 4 (four) times daily as needed (  pain).) 150 g 2   docusate sodium  (COLACE) 100 MG capsule Take 2 capsules (200 mg total) by mouth daily. 180 capsule 3   ELIQUIS  5 MG TABS tablet TAKE 1 TABLET BY MOUTH 2 TIMES DAILY 180 tablet 3   febuxostat  (ULORIC ) 40 MG tablet TAKE 1 TABLET BY MOUTH EVERY DAY 90 tablet 3   fluticasone  (FLONASE ) 50 MCG/ACT nasal spray Place 2 sprays into both nostrils daily. 16 g 6   Fluticasone -Umeclidin-Vilant (TRELEGY ELLIPTA ) 100-62.5-25 MCG/ACT AEPB Inhale 1 puff into the lungs daily. 1 each 11   folic acid  (FOLVITE ) 1 MG tablet TAKE 1 TABLET BY MOUTH EVERY DAY 90 tablet 1   glucagon  1 MG injection Inject 1 mg into the vein once as needed for up to 1 dose. 1 each 12   glucose 4 GM chewable tablet Chew 1 tablet (4 g total) by mouth as  needed for low blood sugar. 50 tablet 12   hydrOXYzine  (ATARAX ) 25 MG tablet TAKE 1 TABLET BY MOUTH EVERY 8 HOURS AS NEEDED FOR ANXIETY OR ITCHING (NASAL AND THROAT CONGESTION) 120 tablet 0   insulin  aspart (NOVOLOG  FLEXPEN) 100 UNIT/ML FlexPen Max daily 45 units 45 mL 4   insulin  glargine, 2 Unit Dial , (TOUJEO  MAX SOLOSTAR) 300 UNIT/ML Solostar Pen Inject 28 Units into the skin daily before breakfast. 15 mL 3   linaclotide  (LINZESS ) 145 MCG CAPS capsule Patient take 1 capsule daily.  May add an additional capsule for breakthrough constipation associated with opioid use (Patient taking differently: Take 145 mcg by mouth every other day.) 180 capsule 1   meclizine  (ANTIVERT ) 25 MG tablet Take 1 tablet (25 mg total) by mouth 2 (two) times daily. 180 tablet 1   melatonin 5 MG TABS Take 1 tablet (5 mg total) by mouth at bedtime as needed. 15 tablet 0   memantine  (NAMENDA ) 5 MG tablet TAKE 2 TABLETS BY MOUTH EVERY MORNING and TAKE 1 TABLET BY MOUTH EVERY EVENING (Patient taking differently: Take 5-10 mg by mouth See admin instructions. Take 10 mg by mouth in the morning and 5 mg in the evening) 270 tablet 5   montelukast  (SINGULAIR ) 10 MG tablet Take 1 tablet (10 mg total) by mouth at bedtime. 30 tablet 3   nitroGLYCERIN  (NITROSTAT ) 0.4 MG SL tablet Dissolve 1 tablet under the tongue every 5 minutes as needed for chest pain. Max of 3 doses, then 911. 25 tablet 6   nystatin  (MYCOSTATIN /NYSTOP ) powder Apply 1 Application topically 3 (three) times daily. 60 g 3   ondansetron  (ZOFRAN ) 4 MG tablet Take 1 tablet (4 mg total) by mouth every 8 (eight) hours as needed for nausea or vomiting. 20 tablet 0   pantoprazole  (PROTONIX ) 20 MG tablet TAKE 1 TABLET BY MOUTH EVERY DAY 30 tablet 5   predniSONE  (DELTASONE ) 20 MG tablet Take 3 tablets (60 mg total) by mouth daily. 90 tablet 0   Semaglutide ,0.25 or 0.5MG /DOS, (OZEMPIC , 0.25 OR 0.5 MG/DOSE,) 2 MG/3ML SOPN Inject 0.5 mg into the skin once a week. 9 mL 3   sodium  zirconium cyclosilicate (LOKELMA ) 10 g PACK packet Take 10 g by mouth 2 (two) times daily. 60 packet 0   torsemide  (DEMADEX ) 20 MG tablet TAKE 1 TABLET BY MOUTH ONCE DAILY MAY TAKE AN EXTRA DOES FOR WEIGHT GAIN OF 3LBs IN 1 DAY OR 5LBS IN 1 WEEK OR FOR WORSENING SWELLING 30 tablet 0   traMADol  (ULTRAM ) 50 MG tablet Take 1 tablet (50 mg total) by mouth every 12 (twelve)  hours as needed. Take the first 4 doses with ZOfran  to minimize nausea 30 tablet 0   TYLENOL  500 MG tablet Take 1,000 mg by mouth every 6 (six) hours as needed for mild pain or headache.     TYLENOL  PM EXTRA STRENGTH 500-25 MG TABS tablet Take 2 tablets by mouth at bedtime.     Vitamin D , Ergocalciferol , (DRISDOL ) 1.25 MG (50000 UNIT) CAPS capsule TAKE 1 CAPSULE BY MOUTH ONCE WEEKLY 4 capsule 1   ipratropium-albuterol  (DUONEB) 0.5-2.5 (3) MG/3ML SOLN Take 3 mLs by nebulization every 6 (six) hours as needed (cough, wheezing, or shortness of breath). 360 mL 0   No current facility-administered medications for this visit.    PAST MEDICAL HISTORY: Past Medical History:  Diagnosis Date   Abdominal pain 01/26/2012   Acute deep vein thrombosis (DVT) of femoral vein of right lower extremity (HCC) 03/26/2022   Acute kidney injury superimposed on chronic kidney disease (HCC) 12/18/2014   Acute upper GI bleed 03/26/2022   Allergy     takes Mucinex  daily as needed   Anemia, unspecified    Anxiety    takes Clonazepam  daily as needed   Anxiety associated with depression 06/08/2017   Arthritis    Breast cancer (HCC)    Breast cancer screening 02/19/2014   Breast mass 10/27/2014   Chronic diastolic CHF (congestive heart failure) (HCC)    takes Furosemide  daily   Chronic pain syndrome 02/06/2015   CKD (chronic kidney disease), stage III (HCC)    Coronary artery disease    a. s/p IMI 2004 tx with BMS to RCA;  b. s/p Promus DES to RCA 2/12 c. abnormal nuc 2016 -> cath with med rx. d. NSTEMI 02/2020  mv CAD with severe ISR in pRCA and  m/dRCA lesion both treated with DES.   Debility 08/01/2012   Dementia (HCC)    Depression    takes Cymbalta  daily   Diabetes mellitus    insulin  daily   DOE (dyspnea on exertion) 06/24/2014   Dysphagia, unspecified(787.20) 07/31/2012   Fungal dermatitis 11/13/2007   Qualifier: Diagnosis of  By: Minnette Amato  MD, Ronie Cohen    GERD (gastroesophageal reflux disease)    takes Protonix  daily   Gout, unspecified    Headache    occasionally   History of anemia due to chronic kidney disease 03/26/2022   History of blood clots    History of deep venous thrombosis 01/27/2012   History of pulmonary embolus (PE) 04/22/2014   Hyperkalemia 07/26/2017   Hyperlipidemia    takes Pravastatin  daily   Hypertension    Insomnia    Joint pain    Joint swelling    Morbid obesity (HCC)    Multiple benign nevi 11/15/2016   Myocardial infarction (HCC) 2004   Non-STEMI (non-ST elevated myocardial infarction) (HCC) 02/15/2020   Numbness    Obstructive sleep apnea    does not wear cpap   Osteoarthritis    Osteoarthritis    Osteoarthrosis, unspecified whether generalized or localized, lower leg    Pain in the chest 12/31/2013   Pain, chronic    Personal history of other diseases of the digestive system 12/03/2006   Qualifier: Diagnosis of  By: Minnette Amato  MD, Ronie Cohen    Polymyalgia rheumatica [facility])    Presence of stent in right coronary artery    Pulmonary emboli (HCC) 01/2012   felt to need lifelong anticoagulation but Xarelto  dc in 02/2020 due to need for DAPT   Situational depression 06/04/2009  Qualifier: Diagnosis of  By: Minnette Amato  MD, Ronie Cohen    Syncope, vasovagal 07/04/2021   Type 2 diabetes mellitus with hyperlipidemia (HCC) 02/16/2022   Urinary incontinence    takes Linzess  daily   Vaginitis and vulvovaginitis 06/23/2016   Vertigo    hx of;was taking Meclizine  if needed   Wears dentures     PAST SURGICAL HISTORY: Past Surgical History:  Procedure Laterality Date   ABDOMINAL  HYSTERECTOMY     partial   APPENDECTOMY     blood clots/legs and lungs  2013   BREAST BIOPSY Left 07/22/2014   BREAST BIOPSY Left 02/10/2013   BREAST LUMPECTOMY Left 11/05/2014   BREAST LUMPECTOMY WITH RADIOACTIVE SEED LOCALIZATION Left 11/05/2014   Procedure: LEFT BREAST LUMPECTOMY WITH RADIOACTIVE SEED LOCALIZATION;  Surgeon: Oza Blumenthal, MD;  Location: [facility] OR;  Service: General;  Laterality: Left;   CARDIAC CATHETERIZATION     COLONOSCOPY     CORONARY ANGIOPLASTY  2   CORONARY STENT INTERVENTION N/A 02/17/2020   Procedure: CORONARY STENT INTERVENTION;  Surgeon: Arleen Lacer, MD;  Location: [facility] INVASIVE CV LAB;  Service: Cardiovascular;  Laterality: N/A;   ESOPHAGOGASTRODUODENOSCOPY (EGD) WITH PROPOFOL  N/A 11/07/2016   Procedure: ESOPHAGOGASTRODUODENOSCOPY (EGD) WITH PROPOFOL ;  Surgeon: Kenney Peacemaker, MD;  Location: [facility] ENDOSCOPY;  Service: Endoscopy;  Laterality: N/A;   ESOPHAGOGASTRODUODENOSCOPY (EGD) WITH PROPOFOL  N/A 01/05/2023   Procedure: ESOPHAGOGASTRODUODENOSCOPY (EGD) WITH PROPOFOL ;  Surgeon: Tobin Forts, MD;  Location: [facility] ENDOSCOPY;  Service: Gastroenterology;  Laterality: N/A;   EXCISION OF SKIN TAG Right 11/05/2014   Procedure: EXCISION OF RIGHT EYELID SKIN TAG;  Surgeon: Oza Blumenthal, MD;  Location: [facility] OR;  Service: General;  Laterality: Right;   EYE SURGERY Bilateral    cataract    GASTRIC BYPASS  1977    reversed in [facility], [facility]   LEFT HEART CATH AND CORONARY ANGIOGRAPHY N/A 02/17/2020   Procedure: LEFT HEART CATH AND CORONARY ANGIOGRAPHY;  Surgeon: Arleen Lacer, MD;  Location: [facility] INVASIVE CV LAB;  Service: Cardiovascular;  Laterality: N/A;   LEFT HEART CATHETERIZATION WITH CORONARY ANGIOGRAM N/A 06/29/2014   Procedure: LEFT HEART CATHETERIZATION WITH CORONARY ANGIOGRAM;  Surgeon: Millicent Ally, MD;  Location: [facility] CATH LAB;  Service: Cardiovascular;  Laterality: N/A;   MEMBRANE PEEL Right 10/23/2018   Procedure: MEMBRANE PEEL;  Surgeon: Shon Downing,  MD;  Location: [facility] OR;  Service: Ophthalmology;  Laterality: Right;   MI with stent placement  2004   PARS PLANA VITRECTOMY Right 10/23/2018   Procedure: PARS PLANA VITRECTOMY WITH 25 GAUGE;  Surgeon: Shon Downing, MD;  Location: [facility] OR;  Service: Ophthalmology;  Laterality: Right;    FAMILY HISTORY: Family History  Problem Relation Age of Onset   Breast cancer Mother 11   Heart disease Mother    Throat cancer Father    Hypertension Father    Arthritis Father    Diabetes Father    Arthritis Sister    Obesity Sister    Diabetes Sister    Heart disease Cousin    Colon cancer Neg Hx    Stomach cancer Neg Hx    Esophageal cancer Neg Hx     SOCIAL HISTORY: Social History   Socioeconomic History   Marital status: Widowed    Spouse name: Not on file   Number of children: 3   Years of education: Not on file   Highest education level: High school graduate  Occupational History   Occupation: retired  Tobacco Use  Smoking status: Never    Passive exposure: Past   Smokeless tobacco: Never   Tobacco comments:    Both parents smoked, patient was exposed/ "Raised up in smoke, my whole life."  Vaping Use   Vaping status: Never Used  Substance and Sexual Activity   Alcohol use: No    Alcohol/week: 0.0 standard drinks of alcohol   Drug use: No   Sexual activity: Not on file  Other Topics Concern   Not on file  Social History Narrative   Not on file   Social Drivers of Health   Financial Resource Strain: Low Risk  (06/05/2022)   Overall Financial Resource Strain (CARDIA)    Difficulty of Paying Living Expenses: Not hard at all  Food Insecurity: No Food Insecurity (06/11/2023)   Hunger Vital Sign    Worried About Running Out of Food in the Last Year: Never true    Ran Out of Food in the Last Year: Never true  Transportation Needs: No Transportation Needs (06/11/2023)   PRAPARE - Administrator, Civil Service (Medical): No    Lack of Transportation (Non-Medical): No   Physical Activity: Insufficiently Active (06/05/2022)   Exercise Vital Sign    Days of Exercise per Week: 3 days    Minutes of Exercise per Session: 10 min  Stress: Stress Concern Present (06/05/2022)   [employer] of Occupational Health - Occupational Stress Questionnaire    Feeling of Stress : Rather much  Social Connections: Socially Isolated (06/05/2023)   Social Connection and Isolation Panel [NHANES]    Frequency of Communication with Friends and Family: Once a week    Frequency of Social Gatherings with Friends and Family: More than three times a week    Attends Religious Services: Never    Database administrator or Organizations: No    Attends Banker Meetings: Never    Marital Status: Widowed  Intimate Partner Violence: Not At Risk (06/11/2023)   Humiliation, Afraid, Rape, and Kick questionnaire    Fear of Current or Ex-Partner: No    Emotionally Abused: No    Physically Abused: No    Sexually Abused: No      Phebe Brasil, M.D. Ph.D.  [facility] [address], [address] [place], [place] [address] Ph: [phone] Fax: [phone]  CC:  Augustin Leber, MD [address] [place],  [place] [address]  Catheryn Cluck, MD

## 2023-09-26 ENCOUNTER — Ambulatory Visit: Payer: Self-pay | Admitting: Neurology

## 2023-09-26 LAB — CK: Total CK: 63 U/L (ref 26–161)

## 2023-09-26 LAB — C-REACTIVE PROTEIN: CRP: 6 mg/L (ref 0–10)

## 2023-09-26 LAB — HGB A1C W/O EAG: Hgb A1c MFr Bld: 9.4 % — ABNORMAL HIGH (ref 4.8–5.6)

## 2023-09-26 LAB — ACETYLCHOLINE RECEPTOR, BINDING: AChR Binding Ab, Serum: <0.07 nmol/L (ref 0.00–0.24)

## 2023-09-26 LAB — SEDIMENTATION RATE: Sed Rate: 34 mm/h (ref 0–40)

## 2023-09-28 ENCOUNTER — Ambulatory Visit (INDEPENDENT_AMBULATORY_CARE_PROVIDER_SITE_OTHER): Payer: PPO

## 2023-09-28 DIAGNOSIS — Z0000 Encounter for general adult medical examination without abnormal findings: Secondary | ICD-10-CM | POA: Diagnosis not present

## 2023-09-28 NOTE — Progress Notes (Signed)
 Subjective:   [name] is a 89 y.o. who presents for a Medicare Wellness preventive visit.  As a reminder, Annual Wellness Visits don't include a physical exam, and some assessments may be limited, especially if this visit is performed virtually. We may recommend an in-person follow-up visit with your provider if needed.  Visit Complete: Virtual I connected with  [name] on 09/28/23 by a audio enabled telemedicine application and verified that I am speaking with the correct person using two identifiers.  Patient Location: Home  Provider Location: Office/Clinic  I discussed the limitations of evaluation and management by telemedicine. The patient expressed understanding and agreed to proceed.  Vital Signs: Because this visit was a virtual/telehealth visit, some criteria may be missing or patient reported. Any vitals not documented were not able to be obtained and vitals that have been documented are patient reported.  VideoError- Librarian, academic were attempted between this provider and patient, however failed, due to patient having technical difficulties OR patient did not have access to video capability.  We continued and completed visit with audio only.   Persons Participating in Visit: Patient assisted by daughter.  AWV Questionnaire: No: Patient Medicare AWV questionnaire was not completed prior to this visit.  Cardiac Risk Factors include: advanced age (>52men, >73 women);diabetes mellitus;dyslipidemia;hypertension     Objective:     Today's Vitals   There is no height or weight on file to calculate BMI.     09/28/2023    3:48 PM 09/05/2023    3:11 PM 06/05/2023    3:11 PM 01/05/2023    1:02 PM 01/03/2023   10:20 PM 01/03/2023   10:17 PM 01/03/2023    5:00 PM  Advanced Directives  Does Patient Have a Medical Advance Directive? Yes No No Yes Yes Yes No  Type of Advance Directive Out of facility DNR (pink MOST or yellow form)    Living will Living will Living will   Does patient want to make changes to medical advance directive?     No - Patient declined    Would patient like information on creating a medical advance directive?  No - Patient declined No - Patient declined  No - Patient declined No - Patient declined No - Patient declined    Current Medications (verified) Outpatient Encounter Medications as of 09/28/2023  Medication Sig   albuterol  (PROAIR  HFA) 108 (90 Base) MCG/ACT inhaler Inhale 1-2 puffs into the lungs every 6 (six) hours as needed for wheezing or shortness of breath.   amitriptyline  (ELAVIL ) 75 MG tablet TAKE 1 TABLET BY MOUTH AT BEDTIME   atorvastatin  (LIPITOR ) 80 MG tablet TAKE 1 TABLET BY MOUTH EVERY DAY (Patient taking differently: Take 80 mg by mouth at bedtime.)   azelastine  (ASTELIN ) 0.1 % nasal spray Place 2 sprays into both nostrils 2 (two) times daily.   butalbital -acetaminophen -caffeine  (FIORICET ) 50-325-40 MG tablet Take 1 tablet by mouth every 6 (six) hours as needed for headache.   carvedilol  (COREG ) 12.5 MG tablet TAKE 1 TABLET BY MOUTH 2 TIMES DAILY   cetirizine  (ZYRTEC ) 10 MG tablet Take 10 mg by mouth daily as needed for allergies or rhinitis.   diclofenac  Sodium (VOLTAREN ) 1 % GEL Apply 2 g topically 4 (four) times daily. (Patient taking differently: Apply 2 g topically 4 (four) times daily as needed (pain).)   ELIQUIS  5 MG TABS tablet TAKE 1 TABLET BY MOUTH 2 TIMES DAILY   febuxostat  (ULORIC ) 40 MG tablet TAKE 1 TABLET  BY MOUTH EVERY DAY   fluticasone  (FLONASE ) 50 MCG/ACT nasal spray Place 2 sprays into both nostrils daily.   Fluticasone -Umeclidin-Vilant (TRELEGY ELLIPTA ) 100-62.5-25 MCG/ACT AEPB Inhale 1 puff into the lungs daily.   folic acid  (FOLVITE ) 1 MG tablet TAKE 1 TABLET BY MOUTH EVERY DAY   glucagon  1 MG injection Inject 1 mg into the vein once as needed for up to 1 dose.   glucose 4 GM chewable tablet Chew 1 tablet (4 g total) by mouth as needed for low blood sugar.    hydrOXYzine  (ATARAX ) 25 MG tablet TAKE 1 TABLET BY MOUTH EVERY 8 HOURS AS NEEDED FOR ANXIETY OR ITCHING (NASAL AND THROAT CONGESTION)   insulin  aspart (NOVOLOG  FLEXPEN) 100 UNIT/ML FlexPen Max daily 45 units   insulin  glargine, 2 Unit Dial , (TOUJEO  MAX SOLOSTAR) 300 UNIT/ML Solostar Pen Inject 28 Units into the skin daily before breakfast.   linaclotide  (LINZESS ) 145 MCG CAPS capsule Patient take 1 capsule daily.  May add an additional capsule for breakthrough constipation associated with opioid use (Patient taking differently: Take 145 mcg by mouth every other day.)   meclizine  (ANTIVERT ) 25 MG tablet Take 1 tablet (25 mg total) by mouth 2 (two) times daily.   melatonin 5 MG TABS Take 1 tablet (5 mg total) by mouth at bedtime as needed.   memantine  (NAMENDA ) 5 MG tablet TAKE 2 TABLETS BY MOUTH EVERY MORNING and TAKE 1 TABLET BY MOUTH EVERY EVENING (Patient taking differently: Take 5-10 mg by mouth See admin instructions. Take 10 mg by mouth in the morning and 5 mg in the evening)   montelukast  (SINGULAIR ) 10 MG tablet Take 1 tablet (10 mg total) by mouth at bedtime.   nitroGLYCERIN  (NITROSTAT ) 0.4 MG SL tablet Dissolve 1 tablet under the tongue every 5 minutes as needed for chest pain. Max of 3 doses, then 911.   nystatin  (MYCOSTATIN /NYSTOP ) powder Apply 1 Application topically 3 (three) times daily.   ondansetron  (ZOFRAN ) 4 MG tablet Take 1 tablet (4 mg total) by mouth every 8 (eight) hours as needed for nausea or vomiting.   pantoprazole  (PROTONIX ) 20 MG tablet TAKE 1 TABLET BY MOUTH EVERY DAY   predniSONE  (DELTASONE ) 10 MG tablet Take 4 tablets (40 mg total) by mouth daily with breakfast.   Semaglutide ,0.25 or 0.5MG /DOS, (OZEMPIC , 0.25 OR 0.5 MG/DOSE,) 2 MG/3ML SOPN Inject 0.5 mg into the skin once a week.   sodium zirconium cyclosilicate  (LOKELMA ) 10 g PACK packet Take 10 g by mouth 2 (two) times daily.   torsemide  (DEMADEX ) 20 MG tablet TAKE 1 TABLET BY MOUTH ONCE DAILY MAY TAKE AN EXTRA DOES  FOR WEIGHT GAIN OF 3LBs IN 1 DAY OR 5LBS IN 1 WEEK OR FOR WORSENING SWELLING   traMADol  (ULTRAM ) 50 MG tablet Take 1 tablet (50 mg total) by mouth every 12 (twelve) hours as needed. Take the first 4 doses with ZOfran  to minimize nausea   TYLENOL  500 MG tablet Take 1,000 mg by mouth every 6 (six) hours as needed for mild pain or headache.   TYLENOL  PM EXTRA STRENGTH 500-25 MG TABS tablet Take 2 tablets by mouth at bedtime.   Vitamin D , Ergocalciferol , (DRISDOL ) 1.25 MG (50000 UNIT) CAPS capsule TAKE 1 CAPSULE BY MOUTH ONCE WEEKLY   Continuous Glucose Receiver (FREESTYLE LIBRE 3 READER) DEVI Continuous glucose monitor (Patient not taking: Reported on 09/28/2023)   Continuous Glucose Sensor (FREESTYLE LIBRE 14 DAY SENSOR) MISC PLACE 1 DEVICE ON THE SKIN AS DIRECTED EVERY 14 DAYS (Patient not taking: Reported  on 09/28/2023)   docusate sodium  (COLACE) 100 MG capsule Take 2 capsules (200 mg total) by mouth daily. (Patient not taking: Reported on 09/28/2023)   ipratropium-albuterol  (DUONEB) 0.5-2.5 (3) MG/3ML SOLN Take 3 mLs by nebulization every 6 (six) hours as needed (cough, wheezing, or shortness of breath).   predniSONE  (DELTASONE ) 20 MG tablet Take 3 tablets (60 mg total) by mouth daily. (Patient not taking: Reported on 09/28/2023)   No facility-administered encounter medications on file as of 09/28/2023.    Allergies (verified) Sulfonamide derivatives, Duloxetine , Lokelma  [sodium zirconium cyclosilicate ], Aricept  [donepezil  hcl], and Tramadol    History: Past Medical History:  Diagnosis Date   Abdominal pain 01/26/2012   Acute deep vein thrombosis (DVT) of femoral vein of right lower extremity (HCC) 03/26/2022   Acute kidney injury superimposed on chronic kidney disease (HCC) 12/18/2014   Acute upper GI bleed 03/26/2022   Allergy     takes Mucinex  daily as needed   Anemia, unspecified    Anxiety    takes Clonazepam  daily as needed   Anxiety associated with depression 06/08/2017   Arthritis     Breast cancer (HCC)    Breast cancer screening 02/19/2014   Breast mass 10/27/2014   Chronic diastolic CHF (congestive heart failure) (HCC)    takes Furosemide  daily   Chronic pain syndrome 02/06/2015   CKD (chronic kidney disease), stage III (HCC)    Coronary artery disease    a. s/p IMI 2004 tx with BMS to RCA;  b. s/p Promus DES to RCA 2/12 c. abnormal nuc 2016 -> cath with med rx. d. NSTEMI 02/2020  mv CAD with severe ISR in pRCA and m/dRCA lesion both treated with DES.   Debility 08/01/2012   Dementia (HCC)    Depression    takes Cymbalta  daily   Diabetes mellitus    insulin  daily   DOE (dyspnea on exertion) 06/24/2014   Dysphagia, unspecified(787.20) 07/31/2012   Fungal dermatitis 11/13/2007   Qualifier: Diagnosis of  By: Minnette Amato  MD, Ronie Cohen    GERD (gastroesophageal reflux disease)    takes Protonix  daily   Gout, unspecified    Headache    occasionally   History of anemia due to chronic kidney disease 03/26/2022   History of blood clots    History of deep venous thrombosis 01/27/2012   History of pulmonary embolus (PE) 04/22/2014   Hyperkalemia 07/26/2017   Hyperlipidemia    takes Pravastatin  daily   Hypertension    Insomnia    Joint pain    Joint swelling    Morbid obesity (HCC)    Multiple benign nevi 11/15/2016   Myocardial infarction (HCC) 2004   Non-STEMI (non-ST elevated myocardial infarction) (HCC) 02/15/2020   Numbness    Obstructive sleep apnea    does not wear cpap   Osteoarthritis    Osteoarthritis    Osteoarthrosis, unspecified whether generalized or localized, lower leg    Pain in the chest 12/31/2013   Pain, chronic    Personal history of other diseases of the digestive system 12/03/2006   Qualifier: Diagnosis of  By: Minnette Amato  MD, Ronie Cohen    Polymyalgia rheumatica [facility])    Presence of stent in right coronary artery    Pulmonary emboli (HCC) 01/2012   felt to need lifelong anticoagulation but Xarelto  dc in 02/2020 due to need for DAPT    Situational depression 06/04/2009   Qualifier: Diagnosis of  By: Minnette Amato  MD, Ronie Cohen    Syncope, vasovagal 07/04/2021   Type 2  diabetes mellitus with hyperlipidemia (HCC) 02/16/2022   Urinary incontinence    takes Linzess  daily   Vaginitis and vulvovaginitis 06/23/2016   Vertigo    hx of;was taking Meclizine  if needed   Wears dentures    Past Surgical History:  Procedure Laterality Date   ABDOMINAL HYSTERECTOMY     partial   APPENDECTOMY     blood clots/legs and lungs  2013   BREAST BIOPSY Left 07/22/2014   BREAST BIOPSY Left 02/10/2013   BREAST LUMPECTOMY Left 11/05/2014   BREAST LUMPECTOMY WITH RADIOACTIVE SEED LOCALIZATION Left 11/05/2014   Procedure: LEFT BREAST LUMPECTOMY WITH RADIOACTIVE SEED LOCALIZATION;  Surgeon: Oza Blumenthal, MD;  Location: [facility] OR;  Service: General;  Laterality: Left;   CARDIAC CATHETERIZATION     COLONOSCOPY     CORONARY ANGIOPLASTY  2   CORONARY STENT INTERVENTION N/A 02/17/2020   Procedure: CORONARY STENT INTERVENTION;  Surgeon: Arleen Lacer, MD;  Location: [facility] INVASIVE CV LAB;  Service: Cardiovascular;  Laterality: N/A;   ESOPHAGOGASTRODUODENOSCOPY (EGD) WITH PROPOFOL  N/A 11/07/2016   Procedure: ESOPHAGOGASTRODUODENOSCOPY (EGD) WITH PROPOFOL ;  Surgeon: Kenney Peacemaker, MD;  Location: [facility] ENDOSCOPY;  Service: Endoscopy;  Laterality: N/A;   ESOPHAGOGASTRODUODENOSCOPY (EGD) WITH PROPOFOL  N/A 01/05/2023   Procedure: ESOPHAGOGASTRODUODENOSCOPY (EGD) WITH PROPOFOL ;  Surgeon: Tobin Forts, MD;  Location: [facility] ENDOSCOPY;  Service: Gastroenterology;  Laterality: N/A;   EXCISION OF SKIN TAG Right 11/05/2014   Procedure: EXCISION OF RIGHT EYELID SKIN TAG;  Surgeon: Oza Blumenthal, MD;  Location: [facility] OR;  Service: General;  Laterality: Right;   EYE SURGERY Bilateral    cataract    GASTRIC BYPASS  1977    reversed in [facility], [facility]   LEFT HEART CATH AND CORONARY ANGIOGRAPHY N/A 02/17/2020   Procedure: LEFT HEART CATH AND CORONARY ANGIOGRAPHY;   Surgeon: Arleen Lacer, MD;  Location: [facility] INVASIVE CV LAB;  Service: Cardiovascular;  Laterality: N/A;   LEFT HEART CATHETERIZATION WITH CORONARY ANGIOGRAM N/A 06/29/2014   Procedure: LEFT HEART CATHETERIZATION WITH CORONARY ANGIOGRAM;  Surgeon: Millicent Ally, MD;  Location: [facility] CATH LAB;  Service: Cardiovascular;  Laterality: N/A;   MEMBRANE PEEL Right 10/23/2018   Procedure: MEMBRANE PEEL;  Surgeon: Shon Downing, MD;  Location: [facility] OR;  Service: Ophthalmology;  Laterality: Right;   MI with stent placement  2004   PARS PLANA VITRECTOMY Right 10/23/2018   Procedure: PARS PLANA VITRECTOMY WITH 25 GAUGE;  Surgeon: Shon Downing, MD;  Location: [facility] OR;  Service: Ophthalmology;  Laterality: Right;   Family History  Problem Relation Age of Onset   Breast cancer Mother 96   Heart disease Mother    Throat cancer Father    Hypertension Father    Arthritis Father    Diabetes Father    Arthritis Sister    Obesity Sister    Diabetes Sister    Heart disease Cousin    Colon cancer Neg Hx    Stomach cancer Neg Hx    Esophageal cancer Neg Hx    Social History   Socioeconomic History   Marital status: Widowed    Spouse name: Not on file   Number of children: 3   Years of education: Not on file   Highest education level: High school graduate  Occupational History   Occupation: retired  Tobacco Use   Smoking status: Never    Passive exposure: Past   Smokeless tobacco: Never   Tobacco comments:    Both parents smoked, patient was exposed/ "Raised up in  smoke, my whole life."  Vaping Use   Vaping status: Never Used  Substance and Sexual Activity   Alcohol use: No    Alcohol/week: 0.0 standard drinks of alcohol   Drug use: No   Sexual activity: Not on file  Other Topics Concern   Not on file  Social History Narrative   Not on file   Social Drivers of Health   Financial Resource Strain: Low Risk  (09/28/2023)   Overall Financial Resource Strain (CARDIA)    Difficulty of Paying  Living Expenses: Not hard at all  Food Insecurity: No Food Insecurity (09/28/2023)   Hunger Vital Sign    Worried About Running Out of Food in the Last Year: Never true    Ran Out of Food in the Last Year: Never true  Transportation Needs: No Transportation Needs (09/28/2023)   PRAPARE - Administrator, Civil Service (Medical): No    Lack of Transportation (Non-Medical): No  Physical Activity: Inactive (09/28/2023)   Exercise Vital Sign    Days of Exercise per Week: 0 days    Minutes of Exercise per Session: 0 min  Stress: Stress Concern Present (09/28/2023)   [employer] of Occupational Health - Occupational Stress Questionnaire    Feeling of Stress : To some extent  Social Connections: Socially Isolated (09/28/2023)   Social Connection and Isolation Panel [NHANES]    Frequency of Communication with Friends and Family: Three times a week    Frequency of Social Gatherings with Friends and Family: More than three times a week    Attends Religious Services: Never    Database administrator or Organizations: No    Attends Banker Meetings: Never    Marital Status: Widowed    Tobacco Counseling Counseling given: Not Answered Tobacco comments: Both parents smoked, patient was exposed/ "Raised up in smoke, my whole life."    Clinical Intake:  Pre-visit preparation completed: Yes  Pain : No/denies pain     Nutritional Risks: None Diabetes: Yes CBG done?: No Did pt. bring in CBG monitor from home?: No  Lab Results  Component Value Date   HGBA1C 9.4 (H) 09/25/2023   HGBA1C 7.7 (A) 06/21/2023   HGBA1C 7.2 (H) 01/03/2023     How often do you need to have someone help you when you read instructions, pamphlets, or other written materials from your doctor or pharmacy?: 4 - Often  Interpreter Needed?: No  Information entered by :: NAllen LPN   Activities of Daily Living     09/28/2023    3:40 PM 06/05/2023    3:11 PM  In your present state of  health, do you have any difficulty performing the following activities:  Hearing? 1 1  Comment has hearing aids   Vision? 0 1  Difficulty concentrating or making decisions? 1 1  Walking or climbing stairs? 1   Dressing or bathing? 1   Doing errands, shopping? 1 1  Preparing Food and eating ? Y   Using the Toilet? Y   In the past six months, have you accidently leaked urine? N   Do you have problems with loss of bowel control? N   Managing your Medications? Y   Managing your Finances? Y   Housekeeping or managing your Housekeeping? Y     Patient Care Team: Catheryn Cluck, MD as PCP - General (Family Medicine) Danise Durie, Craige Dixon, MD as Consulting Physician (Cardiology) Maris Sickle, MD as Consulting Physician (Ophthalmology) Oza Blumenthal, MD as  Consulting Physician (General Surgery) Deann Exon, MD (Inactive) as Referring Physician (Obstetrics and Gynecology) Seward Dao Alleen Arbour, MD as Consulting Physician (Ophthalmology) Shamleffer, Julian Obey, MD as Consulting Physician (Endocrinology) Frankie Israel, MD as Consulting Physician (Hematology) Merriam Abbey, DO as Consulting Physician (Neurology) Sonny Dust, MD (Inactive) as Consulting Physician (Cardiology) Clarnce Crow, RN as VBCI Care Management (General Surgery) Clarnce Crow, RN  Indicate any recent Medical Services you may have received from other than [facility] providers in the past year (date may be approximate).     Assessment:    This is a routine wellness examination for [name].  Hearing/Vision screen Hearing Screening - Comments:: Has hearing aids that are maintained Vision Screening - Comments:: Regular eye exams, Dr. Seward Dao   Goals Addressed             This Visit's Progress    Patient Stated       09/28/2023, wants to eat healthy       Depression Screen     09/28/2023    3:50 PM 04/09/2023    2:07 PM 04/09/2023    2:06 PM 03/21/2023   10:46 AM 12/21/2022    1:04 PM 12/19/2022     2:51 PM 11/29/2022    2:21 PM  PHQ 2/9 Scores  PHQ - 2 Score 0 0 0 0 0 3 6  PHQ- 9 Score 6 0    11 16    Fall Risk     09/28/2023    3:49 PM 09/10/2023   10:16 AM 08/17/2023    4:03 PM 03/21/2023   10:46 AM 03/19/2023    9:36 AM  Fall Risk   Falls in the past year? 1 1 1  0 0  Comment legs give out      Number falls in past yr: 1 1 0 0   Injury with Fall? 0 0 0 0   Risk for fall due to : Impaired balance/gait;Impaired mobility;History of fall(s);Medication side effect No Fall Risks Impaired mobility No Fall Risks   Follow up  Falls prevention discussed  Falls prevention discussed     MEDICARE RISK AT HOME:  Medicare Risk at Home Any stairs in or around the home?: Yes If so, are there any without handrails?: No Home free of loose throw rugs in walkways, pet beds, electrical cords, etc?: Yes Adequate lighting in your home to reduce risk of falls?: Yes Life alert?: No Use of a cane, walker or w/c?: Yes Grab bars in the bathroom?: Yes Shower chair or bench in shower?: Yes Elevated toilet seat or a handicapped toilet?: Yes  TIMED UP AND GO:  Was the test performed?  No  Cognitive Function: Impaired: Patient has current diagnosis of cognitive impairment.    04/12/2018    2:13 PM 08/02/2016    2:51 PM 08/02/2016   11:46 AM 07/27/2016    9:54 AM 03/24/2016    3:53 PM  MMSE - Mini Mental State Exam  Orientation to time 4 4 4 4 5   Orientation to Place 5 5 5 5 5   Registration 3 3 3 3 3   Attention/ Calculation 5 5 5 5  0  Recall 1 1 1 1 2   Language- name 2 objects 2 2 2 2 2   Language- repeat 1 1 1 1 1   Language- follow 3 step command 3 2 2 1 3   Language- read & follow direction 1 1 1 1 1   Write a sentence 1 1 1 1 1   Copy design 0  1 1 1 1   Total score 26 26 26 25 24         06/05/2022   11:38 AM 04/25/2021    4:29 PM 04/22/2020   11:07 AM 04/14/2019    1:58 PM  6CIT Screen  What Year? 0 points 4 points 0 points 0 points  What month? 0 points 0 points 0 points 0 points   What time? 0 points 0 points 0 points 0 points  Count back from 20 4 points 2 points 0 points 4 points  Months in reverse 4 points 4 points 4 points 4 points  Repeat phrase 4 points 2 points 6 points 8 points  Total Score 12 points 12 points 10 points 16 points    Immunizations Immunization History  Administered Date(s) Administered   Fluad Quad(high Dose 65+) 02/12/2019, 02/18/2020, 01/26/2021   Fluad Trivalent(High Dose 65+) 02/22/2023   Influenza Whole 03/14/2007, 02/13/2008, 01/18/2010   Influenza, High Dose Seasonal PF 01/16/2017, 03/11/2018   Influenza,inj,Quad PF,6+ Mos 01/08/2013, 02/18/2014, 01/18/2015, 01/24/2016   Influenza-Unspecified 02/15/2012, 02/18/2014   PFIZER(Purple Top)SARS-COV-2 Vaccination 07/06/2019, 08/05/2019, 12/30/2019   Pneumococcal Conjugate-13 02/27/2017   Pneumococcal Polysaccharide-23 05/08/2002   Td 05/08/2008    Screening Tests Health Maintenance  Topic Date Due   Zoster Vaccines- Shingrix (1 of 2) Never done   COVID-19 Vaccine (4 - 2024-25 season) 01/07/2023   MAMMOGRAM  07/07/2023   INFLUENZA VACCINE  12/07/2023   HEMOGLOBIN A1C  03/27/2024   OPHTHALMOLOGY EXAM  04/09/2024   FOOT EXAM  06/20/2024   Medicare Annual Wellness (AWV)  09/27/2024   Pneumonia Vaccine 81+ Years old  Completed   DEXA SCAN  Completed   HPV VACCINES  Aged Out   Meningococcal B Vaccine  Aged Out   DTaP/Tdap/Td  Discontinued    Health Maintenance  Health Maintenance Due  Topic Date Due   Zoster Vaccines- Shingrix (1 of 2) Never done   COVID-19 Vaccine (4 - 2024-25 season) 01/07/2023   MAMMOGRAM  07/07/2023   Health Maintenance Items Addressed: Mammograms no longer needed. Due for Shingrix and covid vaccine.  Additional Screening:  Vision Screening: Recommended annual ophthalmology exams for early detection of glaucoma and other disorders of the eye.  Dental Screening: Recommended annual dental exams for proper oral hygiene  Community Resource Referral  / Chronic Care Management: CRR required this visit?  No   CCM required this visit?  No   Plan:    I have personally reviewed and noted the following in the patient's chart:   Medical and social history Use of alcohol, tobacco or illicit drugs  Current medications and supplements including opioid prescriptions. Patient is not currently taking opioid prescriptions. Functional ability and status Nutritional status Physical activity Advanced directives List of other physicians Hospitalizations, surgeries, and ER visits in previous 12 months Vitals Screenings to include cognitive, depression, and falls Referrals and appointments  In addition, I have reviewed and discussed with patient certain preventive protocols, quality metrics, and best practice recommendations. A written personalized care plan for preventive services as well as general preventive health recommendations were provided to patient.   Areatha Beecham, LPN   08/14/8117   After Visit Summary: (MyChart) Due to this being a telephonic visit, the after visit summary with patients personalized plan was offered to patient via MyChart   Notes: Nothing significant to report at this time.

## 2023-09-28 NOTE — Patient Instructions (Signed)
 [name] , Thank you for taking time out of your busy schedule to complete your Annual Wellness Visit with me. I enjoyed our conversation and look forward to speaking with you again next year. I, as well as your care team,  appreciate your ongoing commitment to your health goals. Please review the following plan we discussed and let me know if I can assist you in the future. Your Game plan/ To Do List    Referrals: If you haven't heard from the office you've been referred to, please reach out to them at the phone provided.  N/a Follow up Visits: Next Medicare AWV with our clinical staff: 10/03/2024 at 3:40   Have you seen your provider in the last 6 months (3 months if uncontrolled diabetes)? Yes Next Office Visit with your provider: 12/11/2023 at 10:20  Clinician Recommendations:  Aim for 30 minutes of exercise or brisk walking, 6-8 glasses of water , and 5 servings of fruits and vegetables each day.       This is a list of the screening recommended for you and due dates:  Health Maintenance  Topic Date Due   Zoster (Shingles) Vaccine (1 of 2) Never done   COVID-19 Vaccine (4 - 2024-25 season) 01/07/2023   Mammogram  07/07/2023   Flu Shot  12/07/2023   Hemoglobin A1C  03/27/2024   Eye exam for diabetics  04/09/2024   Complete foot exam   06/20/2024   Medicare Annual Wellness Visit  09/27/2024   Pneumonia Vaccine  Completed   DEXA scan (bone density measurement)  Completed   HPV Vaccine  Aged Out   Meningitis B Vaccine  Aged Out   DTaP/Tdap/Td vaccine  Discontinued    Advanced directives: (In Chart) A copy of your advanced directives are scanned into your chart should your provider ever need it. Advance Care Planning is important because it:  [x]  Makes sure you receive the medical care that is consistent with your values, goals, and preferences  [x]  It provides guidance to your family and loved ones and reduces their decisional burden about whether or not they are making the right  decisions based on your wishes.  Follow the link provided in your after visit summary or read over the paperwork we have mailed to you to help you started getting your Advance Directives in place. If you need assistance in completing these, please reach out to us  so that we can help you!  See attachments for Preventive Care and Fall Prevention Tips.

## 2023-10-03 ENCOUNTER — Other Ambulatory Visit: Payer: Self-pay

## 2023-10-03 DIAGNOSIS — D509 Iron deficiency anemia, unspecified: Secondary | ICD-10-CM

## 2023-10-04 ENCOUNTER — Ambulatory Visit (INDEPENDENT_AMBULATORY_CARE_PROVIDER_SITE_OTHER): Admitting: Internal Medicine

## 2023-10-04 ENCOUNTER — Encounter: Payer: Self-pay | Admitting: Internal Medicine

## 2023-10-04 ENCOUNTER — Telehealth: Payer: Self-pay

## 2023-10-04 ENCOUNTER — Other Ambulatory Visit: Payer: Self-pay | Admitting: Hematology

## 2023-10-04 ENCOUNTER — Other Ambulatory Visit: Payer: Self-pay | Admitting: Family Medicine

## 2023-10-04 VITALS — BP 108/64 | HR 83 | Temp 97.3°F

## 2023-10-04 DIAGNOSIS — I5032 Chronic diastolic (congestive) heart failure: Secondary | ICD-10-CM

## 2023-10-04 DIAGNOSIS — N390 Urinary tract infection, site not specified: Secondary | ICD-10-CM

## 2023-10-04 DIAGNOSIS — K219 Gastro-esophageal reflux disease without esophagitis: Secondary | ICD-10-CM

## 2023-10-04 LAB — POC URINALSYSI DIPSTICK (AUTOMATED)
Bilirubin, UA: NEGATIVE
Blood, UA: POSITIVE
Glucose, UA: POSITIVE — AB
Ketones, UA: NEGATIVE
Nitrite, UA: NEGATIVE
Protein, UA: POSITIVE — AB
Spec Grav, UA: 1.01 (ref 1.010–1.025)
Urobilinogen, UA: 0.2 U/dL
pH, UA: 6 (ref 5.0–8.0)

## 2023-10-04 MED ORDER — CEPHALEXIN 250 MG PO CAPS: 250.0000 mg | ORAL_CAPSULE | Freq: Two times a day (BID) | ORAL | 0 refills | Status: AC

## 2023-10-04 NOTE — Progress Notes (Signed)
 [facility] [address] [place] [address] Dept: [phone] Dept Fax: [phone]  Acute Care Office Visit  Subjective:   [name] 10-Jun-1934 10/04/2023  Chief Complaint  Patient presents with   Urinary Tract Infection    Started 2 days ago     HPI: URINARY SYMPTOMS Onset: 2 days ago    Fever/chills: no Dysuria: yes Urinary frequency: yes - not more than normal per patient  Urgency: no Foul odor: yes Hematuria: no Abdominal pain: no Suprapubic pain/pressure: no Flank/low back pain: no Nausea/Vomiting: no  Treatments tried: drinking water    Daughter present at bedside during exam.   The following portions of the patient's history were reviewed and updated as appropriate: past medical history, past surgical history, family history, social history, allergies, medications, and problem list.   Patient Active Problem List   Diagnosis Date Noted   Whole body pain 09/25/2023   Hyperkalemia 06/04/2023   Limited mobility 04/09/2023   PND (post-nasal drip) 01/23/2023   Chronic ethmoidal sinusitis 01/11/2023   ABLA (acute blood loss anemia) 01/11/2023   Recurrent acute suppurative otitis media of right ear without spontaneous rupture of tympanic membrane 01/11/2023   Viral URI with cough 01/11/2023   Upper GI bleed 01/03/2023   CAD (coronary artery disease) 01/03/2023   Epigastric abdominal tenderness 01/03/2023   Chills 12/19/2022   Concha bullosa 11/28/2022   Tinnitus of both ears 10/16/2022   Chronic left hip pain 06/13/2022   Dependent for medication administration 06/13/2022   Very limited skin mobility 05/29/2022   Swelling of right upper extremity 04/27/2022   Carpal tunnel syndrome on both sides 04/27/2022   Diabetic peripheral neuropathy associated with type 2 diabetes mellitus (HCC) 04/05/2022   COPD with acute exacerbation (HCC) 03/26/2022   GAD (generalized anxiety disorder) 03/26/2022    Allergic rhinitis 03/26/2022   Type 2 diabetes mellitus with stage 4 chronic kidney disease, with long-term current use of insulin  (HCC) 02/16/2022   Type 2 diabetes mellitus with diabetic neuropathy, with long-term current use of insulin  (HCC) 02/16/2022   Dermatosis papulosa nigra 02/09/2022   CKD (chronic kidney disease), stage IV (HCC) 02/09/2022   Pneumobilia 02/08/2022   Severe episode of recurrent major depressive disorder, without psychotic features (HCC) 12/05/2021   Breast cancer (HCC) 10/22/2021   Chronic pain of right hand 04/27/2021   Iron  deficiency anemia 09/13/2020   Bilateral primary osteoarthritis of knee 04/12/2020   Chronic radicular lumbar pain 03/18/2020   Lumbar spondylosis 03/18/2020   Spinal stenosis, lumbar region, with neurogenic claudication 03/18/2020   Moderate nonproliferative diabetic retinopathy of both eyes without macular edema associated with type 2 diabetes mellitus (HCC) 08/27/2019   Type 2 diabetes mellitus with hyperlipidemia (HCC) 12/13/2017   GERD with esophagitis 11/15/2016   Dementia (HCC) 11/15/2016   Endometrial polyp 06/23/2016   Vitamin D  deficiency 08/02/2015   Acute kidney injury superimposed on chronic kidney disease (HCC) 12/18/2014   Gout 10/27/2014   Vertigo 09/24/2014   Drug-induced constipation 05/10/2014   COPD with chronic bronchitis (HCC) 04/21/2014   Estrogen deficiency 02/19/2014   Insomnia 09/17/2012   Weakness 08/01/2012   Long term current use of anticoagulant therapy 02/02/2012   Chronic diastolic CHF (congestive heart failure) (HCC) 01/29/2012   History of DVT (deep vein thrombosis) 01/27/2012   Class 3 obesity 02/15/2009   Symptomatic anemia 02/15/2009   TENSION HEADACHE 01/07/2008   Nonintractable headache 01/07/2008   Hyperlipidemia 12/03/2006   Essential hypertension 12/03/2006  Past Medical History:  Diagnosis Date   Abdominal pain 01/26/2012   Acute deep vein thrombosis (DVT) of femoral vein of right  lower extremity (HCC) 03/26/2022   Acute kidney injury superimposed on chronic kidney disease (HCC) 12/18/2014   Acute upper GI bleed 03/26/2022   Allergy     takes Mucinex  daily as needed   Anemia, unspecified    Anxiety    takes Clonazepam  daily as needed   Anxiety associated with depression 06/08/2017   Arthritis    Breast cancer (HCC)    Breast cancer screening 02/19/2014   Breast mass 10/27/2014   Chronic diastolic CHF (congestive heart failure) (HCC)    takes Furosemide  daily   Chronic pain syndrome 02/06/2015   CKD (chronic kidney disease), stage III (HCC)    Coronary artery disease    a. s/p IMI 2004 tx with BMS to RCA;  b. s/p Promus DES to RCA 2/12 c. abnormal nuc 2016 -> cath with med rx. d. NSTEMI 02/2020  mv CAD with severe ISR in pRCA and m/dRCA lesion both treated with DES.   Debility 08/01/2012   Dementia (HCC)    Depression    takes Cymbalta  daily   Diabetes mellitus    insulin  daily   DOE (dyspnea on exertion) 06/24/2014   Dysphagia, unspecified(787.20) 07/31/2012   Fungal dermatitis 11/13/2007   Qualifier: Diagnosis of  By: Minnette Amato  MD, Ronie Cohen    GERD (gastroesophageal reflux disease)    takes Protonix  daily   Gout, unspecified    Headache    occasionally   History of anemia due to chronic kidney disease 03/26/2022   History of blood clots    History of deep venous thrombosis 01/27/2012   History of pulmonary embolus (PE) 04/22/2014   Hyperkalemia 07/26/2017   Hyperlipidemia    takes Pravastatin  daily   Hypertension    Insomnia    Joint pain    Joint swelling    Morbid obesity (HCC)    Multiple benign nevi 11/15/2016   Myocardial infarction (HCC) 2004   Non-STEMI (non-ST elevated myocardial infarction) (HCC) 02/15/2020   Numbness    Obstructive sleep apnea    does not wear cpap   Osteoarthritis    Osteoarthritis    Osteoarthrosis, unspecified whether generalized or localized, lower leg    Pain in the chest 12/31/2013   Pain, chronic     Personal history of other diseases of the digestive system 12/03/2006   Qualifier: Diagnosis of  By: Minnette Amato  MD, Ronie Cohen    Polymyalgia rheumatica [facility])    Presence of stent in right coronary artery    Pulmonary emboli (HCC) 01/2012   felt to need lifelong anticoagulation but Xarelto  dc in 02/2020 due to need for DAPT   Situational depression 06/04/2009   Qualifier: Diagnosis of  By: Minnette Amato  MD, Ronie Cohen    Syncope, vasovagal 07/04/2021   Type 2 diabetes mellitus with hyperlipidemia (HCC) 02/16/2022   Urinary incontinence    takes Linzess  daily   Vaginitis and vulvovaginitis 06/23/2016   Vertigo    hx of;was taking Meclizine  if needed   Wears dentures    Past Surgical History:  Procedure Laterality Date   ABDOMINAL HYSTERECTOMY     partial   APPENDECTOMY     blood clots/legs and lungs  2013   BREAST BIOPSY Left 07/22/2014   BREAST BIOPSY Left 02/10/2013   BREAST LUMPECTOMY Left 11/05/2014   BREAST LUMPECTOMY WITH RADIOACTIVE SEED LOCALIZATION Left 11/05/2014   Procedure: LEFT BREAST  LUMPECTOMY WITH RADIOACTIVE SEED LOCALIZATION;  Surgeon: Oza Blumenthal, MD;  Location: [facility] OR;  Service: General;  Laterality: Left;   CARDIAC CATHETERIZATION     COLONOSCOPY     CORONARY ANGIOPLASTY  2   CORONARY STENT INTERVENTION N/A 02/17/2020   Procedure: CORONARY STENT INTERVENTION;  Surgeon: Arleen Lacer, MD;  Location: [facility] INVASIVE CV LAB;  Service: Cardiovascular;  Laterality: N/A;   ESOPHAGOGASTRODUODENOSCOPY (EGD) WITH PROPOFOL  N/A 11/07/2016   Procedure: ESOPHAGOGASTRODUODENOSCOPY (EGD) WITH PROPOFOL ;  Surgeon: Kenney Peacemaker, MD;  Location: [facility] ENDOSCOPY;  Service: Endoscopy;  Laterality: N/A;   ESOPHAGOGASTRODUODENOSCOPY (EGD) WITH PROPOFOL  N/A 01/05/2023   Procedure: ESOPHAGOGASTRODUODENOSCOPY (EGD) WITH PROPOFOL ;  Surgeon: Tobin Forts, MD;  Location: [facility] ENDOSCOPY;  Service: Gastroenterology;  Laterality: N/A;   EXCISION OF SKIN TAG Right 11/05/2014   Procedure: EXCISION OF  RIGHT EYELID SKIN TAG;  Surgeon: Oza Blumenthal, MD;  Location: [facility] OR;  Service: General;  Laterality: Right;   EYE SURGERY Bilateral    cataract    GASTRIC BYPASS  1977    reversed in [facility], [facility]   LEFT HEART CATH AND CORONARY ANGIOGRAPHY N/A 02/17/2020   Procedure: LEFT HEART CATH AND CORONARY ANGIOGRAPHY;  Surgeon: Arleen Lacer, MD;  Location: [facility] INVASIVE CV LAB;  Service: Cardiovascular;  Laterality: N/A;   LEFT HEART CATHETERIZATION WITH CORONARY ANGIOGRAM N/A 06/29/2014   Procedure: LEFT HEART CATHETERIZATION WITH CORONARY ANGIOGRAM;  Surgeon: Millicent Ally, MD;  Location: [facility] CATH LAB;  Service: Cardiovascular;  Laterality: N/A;   MEMBRANE PEEL Right 10/23/2018   Procedure: MEMBRANE PEEL;  Surgeon: Shon Downing, MD;  Location: [facility] OR;  Service: Ophthalmology;  Laterality: Right;   MI with stent placement  2004   PARS PLANA VITRECTOMY Right 10/23/2018   Procedure: PARS PLANA VITRECTOMY WITH 25 GAUGE;  Surgeon: Shon Downing, MD;  Location: [facility] OR;  Service: Ophthalmology;  Laterality: Right;   Family History  Problem Relation Age of Onset   Breast cancer Mother 79   Heart disease Mother    Throat cancer Father    Hypertension Father    Arthritis Father    Diabetes Father    Arthritis Sister    Obesity Sister    Diabetes Sister    Heart disease Cousin    Colon cancer Neg Hx    Stomach cancer Neg Hx    Esophageal cancer Neg Hx     Current Outpatient Medications:    albuterol  (PROAIR  HFA) 108 (90 Base) MCG/ACT inhaler, Inhale 1-2 puffs into the lungs every 6 (six) hours as needed for wheezing or shortness of breath., Disp: 6.7 g, Rfl: 1   amitriptyline  (ELAVIL ) 75 MG tablet, TAKE 1 TABLET BY MOUTH AT BEDTIME, Disp: 90 tablet, Rfl: 0   atorvastatin  (LIPITOR ) 80 MG tablet, TAKE 1 TABLET BY MOUTH EVERY DAY (Patient taking differently: Take 80 mg by mouth at bedtime.), Disp: 90 tablet, Rfl: 3   azelastine  (ASTELIN ) 0.1 % nasal spray, Place 2 sprays into both nostrils 2  (two) times daily., Disp: 30 mL, Rfl: 12   butalbital -acetaminophen -caffeine  (FIORICET ) 50-325-40 MG tablet, Take 1 tablet by mouth every 6 (six) hours as needed for headache., Disp: 20 tablet, Rfl: 0   carvedilol  (COREG ) 12.5 MG tablet, TAKE 1 TABLET BY MOUTH 2 TIMES DAILY, Disp: 180 tablet, Rfl: 1   cephALEXin  (KEFLEX ) 250 MG capsule, Take 1 capsule (250 mg total) by mouth 2 (two) times daily for 7 days., Disp: 14 capsule, Rfl: 0   cetirizine  (ZYRTEC )  10 MG tablet, Take 10 mg by mouth daily as needed for allergies or rhinitis., Disp: , Rfl:    Continuous Glucose Receiver (FREESTYLE LIBRE 3 READER) DEVI, Continuous glucose monitor, Disp: 1 each, Rfl: 0   Continuous Glucose Sensor (FREESTYLE LIBRE 14 DAY SENSOR) MISC, PLACE 1 DEVICE ON THE SKIN AS DIRECTED EVERY 14 DAYS, Disp: 6 each, Rfl: 3   diclofenac  Sodium (VOLTAREN ) 1 % GEL, Apply 2 g topically 4 (four) times daily. (Patient taking differently: Apply 2 g topically 4 (four) times daily as needed (pain).), Disp: 150 g, Rfl: 2   docusate sodium  (COLACE) 100 MG capsule, Take 2 capsules (200 mg total) by mouth daily., Disp: 180 capsule, Rfl: 3   ELIQUIS  5 MG TABS tablet, TAKE 1 TABLET BY MOUTH 2 TIMES DAILY, Disp: 180 tablet, Rfl: 3   febuxostat  (ULORIC ) 40 MG tablet, TAKE 1 TABLET BY MOUTH EVERY DAY, Disp: 90 tablet, Rfl: 3   fluticasone  (FLONASE ) 50 MCG/ACT nasal spray, Place 2 sprays into both nostrils daily., Disp: 16 g, Rfl: 6   Fluticasone -Umeclidin-Vilant (TRELEGY ELLIPTA ) 100-62.5-25 MCG/ACT AEPB, Inhale 1 puff into the lungs daily., Disp: 1 each, Rfl: 11   folic acid  (FOLVITE ) 1 MG tablet, TAKE 1 TABLET BY MOUTH EVERY DAY, Disp: 90 tablet, Rfl: 1   glucagon  1 MG injection, Inject 1 mg into the vein once as needed for up to 1 dose., Disp: 1 each, Rfl: 12   glucose 4 GM chewable tablet, Chew 1 tablet (4 g total) by mouth as needed for low blood sugar., Disp: 50 tablet, Rfl: 12   hydrOXYzine  (ATARAX ) 25 MG tablet, TAKE 1 TABLET BY MOUTH EVERY  8 HOURS AS NEEDED FOR ANXIETY OR ITCHING (NASAL AND THROAT CONGESTION), Disp: 120 tablet, Rfl: 0   insulin  aspart (NOVOLOG  FLEXPEN) 100 UNIT/ML FlexPen, Max daily 45 units, Disp: 45 mL, Rfl: 4   insulin  glargine, 2 Unit Dial , (TOUJEO  MAX SOLOSTAR) 300 UNIT/ML Solostar Pen, Inject 28 Units into the skin daily before breakfast., Disp: 15 mL, Rfl: 3   linaclotide  (LINZESS ) 145 MCG CAPS capsule, Patient take 1 capsule daily.  May add an additional capsule for breakthrough constipation associated with opioid use (Patient taking differently: Take 145 mcg by mouth every other day.), Disp: 180 capsule, Rfl: 1   meclizine  (ANTIVERT ) 25 MG tablet, Take 1 tablet (25 mg total) by mouth 2 (two) times daily., Disp: 180 tablet, Rfl: 1   melatonin 5 MG TABS, Take 1 tablet (5 mg total) by mouth at bedtime as needed., Disp: 15 tablet, Rfl: 0   memantine  (NAMENDA ) 5 MG tablet, TAKE 2 TABLETS BY MOUTH EVERY MORNING and TAKE 1 TABLET BY MOUTH EVERY EVENING (Patient taking differently: Take 5-10 mg by mouth See admin instructions. Take 10 mg by mouth in the morning and 5 mg in the evening), Disp: 270 tablet, Rfl: 5   montelukast  (SINGULAIR ) 10 MG tablet, Take 1 tablet (10 mg total) by mouth at bedtime., Disp: 30 tablet, Rfl: 3   nitroGLYCERIN  (NITROSTAT ) 0.4 MG SL tablet, Dissolve 1 tablet under the tongue every 5 minutes as needed for chest pain. Max of 3 doses, then 911., Disp: 25 tablet, Rfl: 6   nystatin  (MYCOSTATIN /NYSTOP ) powder, Apply 1 Application topically 3 (three) times daily., Disp: 60 g, Rfl: 3   ondansetron  (ZOFRAN ) 4 MG tablet, Take 1 tablet (4 mg total) by mouth every 8 (eight) hours as needed for nausea or vomiting., Disp: 20 tablet, Rfl: 0   pantoprazole  (PROTONIX ) 20 MG tablet, TAKE  1 TABLET BY MOUTH EVERY DAY, Disp: 30 tablet, Rfl: 5   predniSONE  (DELTASONE ) 10 MG tablet, Take 4 tablets (40 mg total) by mouth daily with breakfast., Disp: 120 tablet, Rfl: 6   predniSONE  (DELTASONE ) 20 MG tablet, Take 3  tablets (60 mg total) by mouth daily., Disp: 90 tablet, Rfl: 0   Semaglutide ,0.25 or 0.5MG /DOS, (OZEMPIC , 0.25 OR 0.5 MG/DOSE,) 2 MG/3ML SOPN, Inject 0.5 mg into the skin once a week., Disp: 9 mL, Rfl: 3   sodium zirconium cyclosilicate  (LOKELMA ) 10 g PACK packet, Take 10 g by mouth 2 (two) times daily., Disp: 60 packet, Rfl: 0   torsemide  (DEMADEX ) 20 MG tablet, TAKE 1 TABLET BY MOUTH ONCE DAILY MAY TAKE AN EXTRA DOES FOR WEIGHT GAIN OF 3LBs IN 1 DAY OR 5LBS IN 1 WEEK OR FOR WORSENING SWELLING, Disp: 30 tablet, Rfl: 0   TYLENOL  500 MG tablet, Take 1,000 mg by mouth every 6 (six) hours as needed for mild pain or headache., Disp: , Rfl:    TYLENOL  PM EXTRA STRENGTH 500-25 MG TABS tablet, Take 2 tablets by mouth at bedtime., Disp: , Rfl:    Vitamin D , Ergocalciferol , (DRISDOL ) 1.25 MG (50000 UNIT) CAPS capsule, TAKE 1 CAPSULE BY MOUTH ONCE WEEKLY, Disp: 4 capsule, Rfl: 1   ipratropium-albuterol  (DUONEB) 0.5-2.5 (3) MG/3ML SOLN, Take 3 mLs by nebulization every 6 (six) hours as needed (cough, wheezing, or shortness of breath)., Disp: 360 mL, Rfl: 0   traMADol  (ULTRAM ) 50 MG tablet, Take 1 tablet (50 mg total) by mouth every 12 (twelve) hours as needed. Take the first 4 doses with ZOfran  to minimize nausea (Patient not taking: Reported on 10/04/2023), Disp: 30 tablet, Rfl: 0 Allergies  Allergen Reactions   Sulfonamide Derivatives Swelling and Other (See Comments)    Mouth swelling- no resp issues noted   Duloxetine      Headaches   Lokelma  [Sodium Zirconium Cyclosilicate ] Other (See Comments)    Headache, stomach upset   Aricept  [Donepezil  Hcl] Diarrhea, Nausea And Vomiting and Other (See Comments)    GI upset/loose stools   Tramadol  Nausea And Vomiting     ROS: A complete ROS was performed with pertinent positives/negatives noted in the HPI. The remainder of the ROS are negative.    Objective:   Today's Vitals   10/04/23 1046  BP: 108/64  Pulse: 83  Temp: (!) 97.3 F (36.3 C)  TempSrc:  Temporal  SpO2: 98%    GENERAL: Well-appearing, in NAD. Well nourished.  SKIN: Pink, warm and dry. No rash. NECK: Trachea midline. Full ROM w/o pain or tenderness. No lymphadenopathy.  RESPIRATORY: Chest wall symmetrical. Respirations even and non-labored. Breath sounds clear to auscultation bilaterally.  CARDIAC: S1, S2 present, regular rate and rhythm. Peripheral pulses 2+ bilaterally.  EXTREMITIES: Without clubbing, cyanosis, edema. GI: Abdomen soft, non-tender. Normoactive bowel sounds. No CVA tenderness.  PSYCH/MENTAL STATUS: Alert, oriented x 3. Cooperative, appropriate mood and affect.    Results for orders placed or performed in visit on 10/04/23  POCT Urinalysis Dipstick (Automated)  Result Value Ref Range   Color, UA yellow    Clarity, UA cloudy    Glucose, UA Positive (A) Negative   Bilirubin, UA neg    Ketones, UA neg    Spec Grav, UA 1.010 1.010 - 1.025   Blood, UA positive    pH, UA 6.0 5.0 - 8.0   Protein, UA Positive (A) Negative   Urobilinogen, UA 0.2 0.2 or 1.0 E.U./dL   Nitrite, UA neg  Leukocytes, UA Small (1+) (A) Negative      Assessment & Plan:  1. Acute UTI (Primary) - cephALEXin  (KEFLEX ) 250 MG capsule; Take 1 capsule (250 mg total) by mouth 2 (two) times daily for 7 days.  Dispense: 14 capsule; Refill: 0 - Urine Culture - ABX dosing adjusted based on kidney function   Meds ordered this encounter  Medications   cephALEXin  (KEFLEX ) 250 MG capsule    Sig: Take 1 capsule (250 mg total) by mouth 2 (two) times daily for 7 days.    Dispense:  14 capsule    Refill:  0    Supervising Provider:   Catheryn Cluck [[id number]]   Orders Placed This Encounter  Procedures   Urine Culture   POCT Urinalysis Dipstick (Automated)   Lab Orders         Urine Culture         POCT Urinalysis Dipstick (Automated)     No images are attached to the encounter or orders placed in the encounter.  Return if symptoms worsen or fail to improve.   Gavin Kast,  FNP

## 2023-10-04 NOTE — Telephone Encounter (Signed)
 Copied from CRM [id number]. Topic: Clinical - Medical Advice >> Oct 03, 2023  4:16 PM Dewanda Foots wrote: Reason for CRM: Pt states that she has UTI symptoms of burning and frequency the past 2 days and wants to know if we can call something in for her or if we need to make an appt to test the urine. If we can call something in, she would prefer this pharmacy please.  [facility] [place], [place] - [address] [place] [address] Phone: [phone] Fax: [phone] Hours: Not open 24 hours   Patient callback is [phone] or [phone]

## 2023-10-04 NOTE — Progress Notes (Signed)
 Complex Care Management Note Care Guide Note  10/04/2023 Name: [name] MRN: [id number] DOB: [birth year]   Complex Care Management Outreach Attempts: An unsuccessful telephone outreach was attempted today to offer the patient information about available complex care management services.  Follow Up Plan:  Additional outreach attempts will be made to offer the patient complex care management information and services.   Encounter Outcome:  No Answer  [facility]  [facility], [facility] Management Assistant Direct Dial : [phone]  Fax: [phone]

## 2023-10-05 ENCOUNTER — Telehealth: Payer: Self-pay | Admitting: *Deleted

## 2023-10-05 ENCOUNTER — Inpatient Hospital Stay

## 2023-10-05 ENCOUNTER — Encounter (HOSPITAL_COMMUNITY): Payer: Self-pay | Admitting: Emergency Medicine

## 2023-10-05 ENCOUNTER — Inpatient Hospital Stay (HOSPITAL_COMMUNITY)
Admission: EM | Admit: 2023-10-05 | Discharge: 2023-10-07 | DRG: 638 | Disposition: A | Discharge status: Home / Self Care | Attending: Internal Medicine | Admitting: Internal Medicine

## 2023-10-05 ENCOUNTER — Inpatient Hospital Stay: Payer: PPO

## 2023-10-05 ENCOUNTER — Other Ambulatory Visit: Payer: Self-pay

## 2023-10-05 VITALS — BP 153/58 | HR 66 | Temp 97.7°F | Resp 18

## 2023-10-05 DIAGNOSIS — E785 Hyperlipidemia, unspecified: Secondary | ICD-10-CM | POA: Diagnosis present

## 2023-10-05 DIAGNOSIS — Z8249 Family history of ischemic heart disease and other diseases of the circulatory system: Secondary | ICD-10-CM | POA: Diagnosis not present

## 2023-10-05 DIAGNOSIS — I130 Hypertensive heart and chronic kidney disease with heart failure and stage 1 through stage 4 chronic kidney disease, or unspecified chronic kidney disease: Secondary | ICD-10-CM | POA: Diagnosis present

## 2023-10-05 DIAGNOSIS — E871 Hypo-osmolality and hyponatremia: Secondary | ICD-10-CM | POA: Diagnosis not present

## 2023-10-05 DIAGNOSIS — N390 Urinary tract infection, site not specified: Secondary | ICD-10-CM | POA: Diagnosis present

## 2023-10-05 DIAGNOSIS — M315 Giant cell arteritis with polymyalgia rheumatica: Secondary | ICD-10-CM | POA: Diagnosis not present

## 2023-10-05 DIAGNOSIS — F419 Anxiety disorder, unspecified: Secondary | ICD-10-CM | POA: Diagnosis present

## 2023-10-05 DIAGNOSIS — F03918 Unspecified dementia, unspecified severity, with other behavioral disturbance: Secondary | ICD-10-CM | POA: Diagnosis present

## 2023-10-05 DIAGNOSIS — Z86718 Personal history of other venous thrombosis and embolism: Secondary | ICD-10-CM

## 2023-10-05 DIAGNOSIS — J4489 Other specified chronic obstructive pulmonary disease: Secondary | ICD-10-CM | POA: Diagnosis not present

## 2023-10-05 DIAGNOSIS — Z91199 Patient's noncompliance with other medical treatment and regimen due to unspecified reason: Secondary | ICD-10-CM

## 2023-10-05 DIAGNOSIS — Z7901 Long term (current) use of anticoagulants: Secondary | ICD-10-CM

## 2023-10-05 DIAGNOSIS — Z888 Allergy status to other drugs, medicaments and biological substances status: Secondary | ICD-10-CM

## 2023-10-05 DIAGNOSIS — Z853 Personal history of malignant neoplasm of breast: Secondary | ICD-10-CM

## 2023-10-05 DIAGNOSIS — E1122 Type 2 diabetes mellitus with diabetic chronic kidney disease: Secondary | ICD-10-CM | POA: Diagnosis present

## 2023-10-05 DIAGNOSIS — I252 Old myocardial infarction: Secondary | ICD-10-CM

## 2023-10-05 DIAGNOSIS — Z833 Family history of diabetes mellitus: Secondary | ICD-10-CM

## 2023-10-05 DIAGNOSIS — Z882 Allergy status to sulfonamides status: Secondary | ICD-10-CM

## 2023-10-05 DIAGNOSIS — D509 Iron deficiency anemia, unspecified: Secondary | ICD-10-CM

## 2023-10-05 DIAGNOSIS — Z885 Allergy status to narcotic agent status: Secondary | ICD-10-CM

## 2023-10-05 DIAGNOSIS — E1169 Type 2 diabetes mellitus with other specified complication: Secondary | ICD-10-CM | POA: Diagnosis present

## 2023-10-05 DIAGNOSIS — I5032 Chronic diastolic (congestive) heart failure: Secondary | ICD-10-CM | POA: Diagnosis present

## 2023-10-05 DIAGNOSIS — E1165 Type 2 diabetes mellitus with hyperglycemia: Principal | ICD-10-CM | POA: Diagnosis present

## 2023-10-05 DIAGNOSIS — G894 Chronic pain syndrome: Secondary | ICD-10-CM | POA: Diagnosis not present

## 2023-10-05 DIAGNOSIS — K219 Gastro-esophageal reflux disease without esophagitis: Secondary | ICD-10-CM | POA: Diagnosis present

## 2023-10-05 DIAGNOSIS — G4733 Obstructive sleep apnea (adult) (pediatric): Secondary | ICD-10-CM | POA: Diagnosis present

## 2023-10-05 DIAGNOSIS — I10 Essential (primary) hypertension: Secondary | ICD-10-CM | POA: Diagnosis not present

## 2023-10-05 DIAGNOSIS — Z7985 Long-term (current) use of injectable non-insulin antidiabetic drugs: Secondary | ICD-10-CM | POA: Diagnosis not present

## 2023-10-05 DIAGNOSIS — G4700 Insomnia, unspecified: Secondary | ICD-10-CM | POA: Diagnosis present

## 2023-10-05 DIAGNOSIS — R739 Hyperglycemia, unspecified: Principal | ICD-10-CM

## 2023-10-05 DIAGNOSIS — Z955 Presence of coronary angioplasty implant and graft: Secondary | ICD-10-CM

## 2023-10-05 DIAGNOSIS — Z90710 Acquired absence of both cervix and uterus: Secondary | ICD-10-CM

## 2023-10-05 DIAGNOSIS — Z7952 Long term (current) use of systemic steroids: Secondary | ICD-10-CM

## 2023-10-05 DIAGNOSIS — I2510 Atherosclerotic heart disease of native coronary artery without angina pectoris: Secondary | ICD-10-CM | POA: Diagnosis not present

## 2023-10-05 DIAGNOSIS — N184 Chronic kidney disease, stage 4 (severe): Secondary | ICD-10-CM | POA: Diagnosis present

## 2023-10-05 DIAGNOSIS — E66813 Obesity, class 3: Secondary | ICD-10-CM | POA: Diagnosis present

## 2023-10-05 DIAGNOSIS — D631 Anemia in chronic kidney disease: Secondary | ICD-10-CM | POA: Diagnosis present

## 2023-10-05 DIAGNOSIS — T380X5A Adverse effect of glucocorticoids and synthetic analogues, initial encounter: Secondary | ICD-10-CM | POA: Diagnosis present

## 2023-10-05 DIAGNOSIS — Z808 Family history of malignant neoplasm of other organs or systems: Secondary | ICD-10-CM

## 2023-10-05 DIAGNOSIS — Z6841 Body Mass Index (BMI) 40.0 and over, adult: Secondary | ICD-10-CM

## 2023-10-05 DIAGNOSIS — F0394 Unspecified dementia, unspecified severity, with anxiety: Secondary | ICD-10-CM | POA: Diagnosis present

## 2023-10-05 DIAGNOSIS — M316 Other giant cell arteritis: Secondary | ICD-10-CM | POA: Diagnosis not present

## 2023-10-05 DIAGNOSIS — Z8261 Family history of arthritis: Secondary | ICD-10-CM

## 2023-10-05 DIAGNOSIS — Z79899 Other long term (current) drug therapy: Secondary | ICD-10-CM

## 2023-10-05 DIAGNOSIS — Z86711 Personal history of pulmonary embolism: Secondary | ICD-10-CM

## 2023-10-05 DIAGNOSIS — Z803 Family history of malignant neoplasm of breast: Secondary | ICD-10-CM

## 2023-10-05 DIAGNOSIS — Z9884 Bariatric surgery status: Secondary | ICD-10-CM

## 2023-10-05 LAB — I-STAT VENOUS BLOOD GAS, ED
Acid-Base Excess: 5 mmol/L — ABNORMAL HIGH (ref 0.0–2.0)
Bicarbonate: 30.5 mmol/L — ABNORMAL HIGH (ref 20.0–28.0)
Calcium, Ion: 1.12 mmol/L — ABNORMAL LOW (ref 1.15–1.40)
HCT: 29 % — ABNORMAL LOW (ref 36.0–46.0)
Hemoglobin: 9.9 g/dL — ABNORMAL LOW (ref 12.0–15.0)
O2 Saturation: 52 %
Potassium: 5.3 mmol/L — ABNORMAL HIGH (ref 3.5–5.1)
Sodium: 127 mmol/L — ABNORMAL LOW (ref 135–145)
TCO2: 32 mmol/L (ref 22–32)
pCO2, Ven: 48.4 mmHg (ref 44–60)
pH, Ven: 7.408 (ref 7.25–7.43)
pO2, Ven: 28 mmHg — CL (ref 32–45)

## 2023-10-05 LAB — CBC WITH DIFFERENTIAL (CANCER CENTER ONLY)
Abs Immature Granulocytes: 0.05 10*3/uL (ref 0.00–0.07)
Basophils Absolute: 0 10*3/uL (ref 0.0–0.1)
Basophils Relative: 0 %
Eosinophils Absolute: 0 10*3/uL (ref 0.0–0.5)
Eosinophils Relative: 0 %
HCT: 27.8 % — ABNORMAL LOW (ref 36.0–46.0)
Hemoglobin: 9 g/dL — ABNORMAL LOW (ref 12.0–15.0)
Immature Granulocytes: 0 %
Lymphocytes Relative: 4 %
Lymphs Abs: 0.5 10*3/uL — ABNORMAL LOW (ref 0.7–4.0)
MCH: 28.9 pg (ref 26.0–34.0)
MCHC: 32.4 g/dL (ref 30.0–36.0)
MCV: 89.4 fL (ref 80.0–100.0)
Monocytes Absolute: 0.3 10*3/uL (ref 0.1–1.0)
Monocytes Relative: 2 %
Neutro Abs: 11.6 10*3/uL — ABNORMAL HIGH (ref 1.7–7.7)
Neutrophils Relative %: 94 %
Platelet Count: 168 10*3/uL (ref 150–400)
RBC: 3.11 MIL/uL — ABNORMAL LOW (ref 3.87–5.11)
RDW: 13.2 % (ref 11.5–15.5)
WBC Count: 12.5 10*3/uL — ABNORMAL HIGH (ref 4.0–10.5)
nRBC: 0 % (ref 0.0–0.2)

## 2023-10-05 LAB — CMP (CANCER CENTER ONLY)
ALT: 16 U/L (ref 0–44)
AST: 9 U/L — ABNORMAL LOW (ref 15–41)
Albumin: 3.4 g/dL — ABNORMAL LOW (ref 3.5–5.0)
Alkaline Phosphatase: 76 U/L (ref 38–126)
Anion gap: 9 (ref 5–15)
BUN: 80 mg/dL — ABNORMAL HIGH (ref 8–23)
CO2: 30 mmol/L (ref 22–32)
Calcium: 8.6 mg/dL — ABNORMAL LOW (ref 8.9–10.3)
Chloride: 90 mmol/L — ABNORMAL LOW (ref 98–111)
Creatinine: 2.67 mg/dL — ABNORMAL HIGH (ref 0.44–1.00)
GFR, Estimated: 17 mL/min — ABNORMAL LOW (ref >60)
Glucose, Bld: 717 mg/dL (ref 70–99)
Potassium: 4.9 mmol/L (ref 3.5–5.1)
Sodium: 129 mmol/L — ABNORMAL LOW (ref 135–145)
Total Bilirubin: 0.6 mg/dL (ref 0.0–1.2)
Total Protein: 6.5 g/dL (ref 6.5–8.1)

## 2023-10-05 LAB — OSMOLALITY: Osmolality: 304 mosm/kg — ABNORMAL HIGH (ref 275–295)

## 2023-10-05 LAB — CBC
HCT: 27.2 % — ABNORMAL LOW (ref 36.0–46.0)
Hemoglobin: 8.8 g/dL — ABNORMAL LOW (ref 12.0–15.0)
MCH: 29 pg (ref 26.0–34.0)
MCHC: 32.4 g/dL (ref 30.0–36.0)
MCV: 89.8 fL (ref 80.0–100.0)
Platelets: 166 10*3/uL (ref 150–400)
RBC: 3.03 MIL/uL — ABNORMAL LOW (ref 3.87–5.11)
RDW: 13.5 % (ref 11.5–15.5)
WBC: 18.7 10*3/uL — ABNORMAL HIGH (ref 4.0–10.5)
nRBC: 0 % (ref 0.0–0.2)

## 2023-10-05 LAB — COMPREHENSIVE METABOLIC PANEL WITH GFR
ALT: 18 U/L (ref 0–44)
AST: 13 U/L — ABNORMAL LOW (ref 15–41)
Albumin: 2.9 g/dL — ABNORMAL LOW (ref 3.5–5.0)
Alkaline Phosphatase: 59 U/L (ref 38–126)
Anion gap: 11 (ref 5–15)
BUN: 76 mg/dL — ABNORMAL HIGH (ref 8–23)
CO2: 27 mmol/L (ref 22–32)
Calcium: 8.5 mg/dL — ABNORMAL LOW (ref 8.9–10.3)
Chloride: 90 mmol/L — ABNORMAL LOW (ref 98–111)
Creatinine, Ser: 2.64 mg/dL — ABNORMAL HIGH (ref 0.44–1.00)
GFR, Estimated: 17 mL/min — ABNORMAL LOW (ref >60)
Glucose, Bld: 766 mg/dL (ref 70–99)
Potassium: 5.3 mmol/L — ABNORMAL HIGH (ref 3.5–5.1)
Sodium: 128 mmol/L — ABNORMAL LOW (ref 135–145)
Total Bilirubin: 0.6 mg/dL (ref 0.0–1.2)
Total Protein: 6.4 g/dL — ABNORMAL LOW (ref 6.5–8.1)

## 2023-10-05 LAB — CBC WITH DIFFERENTIAL/PLATELET
Abs Immature Granulocytes: 0.06 10*3/uL (ref 0.00–0.07)
Basophils Absolute: 0 10*3/uL (ref 0.0–0.1)
Basophils Relative: 0 %
Eosinophils Absolute: 0 10*3/uL (ref 0.0–0.5)
Eosinophils Relative: 0 %
HCT: 27 % — ABNORMAL LOW (ref 36.0–46.0)
Hemoglobin: 8.9 g/dL — ABNORMAL LOW (ref 12.0–15.0)
Immature Granulocytes: 0 %
Lymphocytes Relative: 4 %
Lymphs Abs: 0.6 10*3/uL — ABNORMAL LOW (ref 0.7–4.0)
MCH: 29.9 pg (ref 26.0–34.0)
MCHC: 33 g/dL (ref 30.0–36.0)
MCV: 90.6 fL (ref 80.0–100.0)
Monocytes Absolute: 0.4 10*3/uL (ref 0.1–1.0)
Monocytes Relative: 3 %
Neutro Abs: 13.6 10*3/uL — ABNORMAL HIGH (ref 1.7–7.7)
Neutrophils Relative %: 93 %
Platelets: 160 10*3/uL (ref 150–400)
RBC: 2.98 MIL/uL — ABNORMAL LOW (ref 3.87–5.11)
RDW: 13.4 % (ref 11.5–15.5)
WBC: 14.7 10*3/uL — ABNORMAL HIGH (ref 4.0–10.5)
nRBC: 0 % (ref 0.0–0.2)

## 2023-10-05 LAB — CBG MONITORING, ED
Glucose-Capillary: >600 mg/dL (ref 70–99)
Glucose-Capillary: >600 mg/dL (ref 70–99)
Glucose-Capillary: 589 mg/dL (ref 70–99)
Glucose-Capillary: >600 mg/dL (ref 70–99)
Glucose-Capillary: 376 mg/dL — ABNORMAL HIGH (ref 70–99)
Glucose-Capillary: 457 mg/dL — ABNORMAL HIGH (ref 70–99)
Glucose-Capillary: 251 mg/dL — ABNORMAL HIGH (ref 70–99)
Glucose-Capillary: 175 mg/dL — ABNORMAL HIGH (ref 70–99)
Glucose-Capillary: 168 mg/dL — ABNORMAL HIGH (ref 70–99)
Glucose-Capillary: 143 mg/dL — ABNORMAL HIGH (ref 70–99)
Glucose-Capillary: 152 mg/dL — ABNORMAL HIGH (ref 70–99)

## 2023-10-05 LAB — BETA-HYDROXYBUTYRIC ACID: Beta-Hydroxybutyric Acid: 0.25 mmol/L (ref 0.05–0.27)

## 2023-10-05 MED ORDER — AMITRIPTYLINE HCL 25 MG PO TABS
75.0000 mg | ORAL_TABLET | Freq: Every day | ORAL | Status: DC
  Administered 2023-10-05 – 2023-10-06 (×2): 75 mg via ORAL
  Filled 2023-10-05 (×2): qty 3
  Filled 2023-10-05: qty 1

## 2023-10-05 MED ORDER — APIXABAN 5 MG PO TABS
5.0000 mg | ORAL_TABLET | Freq: Two times a day (BID) | ORAL | Status: DC
  Administered 2023-10-05 – 2023-10-07 (×4): 5 mg via ORAL
  Filled 2023-10-05 (×4): qty 1

## 2023-10-05 MED ORDER — PANTOPRAZOLE SODIUM 20 MG PO TBEC
20.0000 mg | DELAYED_RELEASE_TABLET | Freq: Every day | ORAL | Status: DC
  Administered 2023-10-05 – 2023-10-07 (×3): 20 mg via ORAL
  Filled 2023-10-05 (×3): qty 1

## 2023-10-05 MED ORDER — DEXTROSE IN LACTATED RINGERS 5 % IV SOLN: INTRAVENOUS | Status: DC

## 2023-10-05 MED ORDER — EPOETIN ALFA 20000 UNIT/ML IJ SOLN
20000.0000 [IU] | Freq: Once | INTRAMUSCULAR | Status: AC
  Administered 2023-10-05: 20000 [IU] via SUBCUTANEOUS
  Filled 2023-10-05: qty 1

## 2023-10-05 MED ORDER — LACTATED RINGERS IV BOLUS
1000.0000 mL | INTRAVENOUS | Status: AC
  Administered 2023-10-05: 1000 mL via INTRAVENOUS

## 2023-10-05 MED ORDER — LACTATED RINGERS IV SOLN: INTRAVENOUS | Status: DC

## 2023-10-05 MED ORDER — LACTATED RINGERS IV SOLN: INTRAVENOUS | Status: AC

## 2023-10-05 MED ORDER — PREDNISONE 20 MG PO TABS
40.0000 mg | ORAL_TABLET | Freq: Every day | ORAL | Status: DC
  Administered 2023-10-06 – 2023-10-07 (×2): 40 mg via ORAL
  Filled 2023-10-05 (×2): qty 2

## 2023-10-05 MED ORDER — DEXTROSE IN LACTATED RINGERS 5 % IV SOLN: INTRAVENOUS | Status: AC

## 2023-10-05 MED ORDER — DEXTROSE 50 % IV SOLN: 0.0000 mL | INTRAVENOUS | Status: DC | PRN

## 2023-10-05 MED ORDER — TORSEMIDE 20 MG PO TABS
20.0000 mg | ORAL_TABLET | Freq: Every day | ORAL | Status: DC
  Administered 2023-10-05 – 2023-10-06 (×2): 20 mg via ORAL
  Filled 2023-10-05 (×2): qty 1

## 2023-10-05 MED ORDER — INSULIN ASPART 100 UNIT/ML IJ SOLN
0.0000 [IU] | Freq: Three times a day (TID) | INTRAMUSCULAR | Status: DC
  Administered 2023-10-06: 8 [IU] via SUBCUTANEOUS
  Administered 2023-10-06: 11 [IU] via SUBCUTANEOUS
  Administered 2023-10-07: 2 [IU] via SUBCUTANEOUS

## 2023-10-05 MED ORDER — BUDESON-GLYCOPYRROL-FORMOTEROL 160-9-4.8 MCG/ACT IN AERO
2.0000 | INHALATION_SPRAY | Freq: Two times a day (BID) | RESPIRATORY_TRACT | Status: DC
  Administered 2023-10-06 – 2023-10-07 (×2): 2 via RESPIRATORY_TRACT
  Filled 2023-10-05 (×2): qty 5.9

## 2023-10-05 MED ORDER — FOSFOMYCIN TROMETHAMINE 3 G PO PACK
3.0000 g | PACK | Freq: Once | ORAL | Status: AC
  Administered 2023-10-05: 3 g via ORAL
  Filled 2023-10-05: qty 3

## 2023-10-05 MED ORDER — INSULIN GLARGINE-YFGN 100 UNIT/ML ~~LOC~~ SOLN: 20.0000 [IU] | Freq: Every day | SUBCUTANEOUS | Status: DC

## 2023-10-05 MED ORDER — HEPARIN SODIUM (PORCINE) 5000 UNIT/ML IJ SOLN: 5000.0000 [IU] | Freq: Three times a day (TID) | INTRAMUSCULAR | Status: DC

## 2023-10-05 MED ORDER — INSULIN REGULAR(HUMAN) IN NACL 100-0.9 UT/100ML-% IV SOLN
INTRAVENOUS | Status: DC
  Administered 2023-10-05: 11.5 [IU]/h via INTRAVENOUS
  Filled 2023-10-05: qty 100

## 2023-10-05 MED ORDER — ATORVASTATIN CALCIUM 80 MG PO TABS
80.0000 mg | ORAL_TABLET | Freq: Every day | ORAL | Status: DC
  Administered 2023-10-05 – 2023-10-07 (×3): 80 mg via ORAL
  Filled 2023-10-05: qty 2
  Filled 2023-10-05 (×2): qty 1

## 2023-10-05 MED ORDER — INSULIN REGULAR(HUMAN) IN NACL 100-0.9 UT/100ML-% IV SOLN: INTRAVENOUS | Status: AC

## 2023-10-05 MED ORDER — INSULIN GLARGINE-YFGN 100 UNIT/ML ~~LOC~~ SOLN
20.0000 [IU] | Freq: Every day | SUBCUTANEOUS | Status: DC
  Administered 2023-10-06 (×2): 20 [IU] via SUBCUTANEOUS
  Filled 2023-10-05 (×4): qty 0.2

## 2023-10-05 MED ORDER — CARVEDILOL 12.5 MG PO TABS
12.5000 mg | ORAL_TABLET | Freq: Two times a day (BID) | ORAL | Status: DC
  Administered 2023-10-05 – 2023-10-07 (×4): 12.5 mg via ORAL
  Filled 2023-10-05 (×4): qty 1

## 2023-10-05 MED ORDER — INSULIN GLARGINE-YFGN 100 UNIT/ML ~~LOC~~ SOLN: 25.0000 [IU] | Freq: Every day | SUBCUTANEOUS | Status: DC

## 2023-10-05 NOTE — ED Notes (Addendum)
Pt's CBG=HI

## 2023-10-05 NOTE — ED Triage Notes (Signed)
 Patietn c/o hyperglycemia.  Patient states that she has been only taking small amounts of insulin  because her glucometer has been broken. Patient gives verbal consent for MSE.

## 2023-10-05 NOTE — Plan of Care (Signed)
 Patient in regards to transitioning patient off of insulin  drip.  Orders placed to transition off insulin  drip.  Start Semglee  20 units.  CBGs before every meal with moderate SSI.  Will need to continue to monitor and adjust as needed.

## 2023-10-05 NOTE — ED Notes (Signed)
 Pt po2 abnormal results to rachel s.rn by at

## 2023-10-05 NOTE — Telephone Encounter (Signed)
 Received call from lab that pt CGB is [phone]. Pt was present for labs and injection appt only today. Called pt daughter to notify of CBG levels and advised to go to nearest ER or Urgent Care. Pt daughter stated that pt had not eaten or taken insulin  this am. Advised to go and have pt evaluated. Pt daughter verbalized understanding

## 2023-10-05 NOTE — ED Notes (Signed)
 Called floor to alert that PT was on their way up. Bed was cleaning for 4 hrs so once bed was ready,this RN waited the 20 min to send up

## 2023-10-05 NOTE — ED Notes (Signed)
 Physician bedside.

## 2023-10-05 NOTE — ED Provider Notes (Signed)
 [place] EMERGENCY DEPARTMENT AT [facility] Provider Note   CSN: [id number] Arrival date & time: 10/05/23  1054     History  Chief Complaint  Patient presents with   Hyperglycemia    [name] is a 89 y.o. female.   Hyperglycemia Patient brought in for high sugars.  Has had frequency.  Saw PCP yesterday and diagnosed with UTI.  Sugars running high.  Is diabetic but has been taking less insulin  due to her meter being broken.    Past Medical History:  Diagnosis Date   Abdominal pain 01/26/2012   Acute deep vein thrombosis (DVT) of femoral vein of right lower extremity (HCC) 03/26/2022   Acute kidney injury superimposed on chronic kidney disease (HCC) 12/18/2014   Acute upper GI bleed 03/26/2022   Allergy     takes Mucinex  daily as needed   Anemia, unspecified    Anxiety    takes Clonazepam  daily as needed   Anxiety associated with depression 06/08/2017   Arthritis    Breast cancer (HCC)    Breast cancer screening 02/19/2014   Breast mass 10/27/2014   Chronic diastolic CHF (congestive heart failure) (HCC)    takes Furosemide  daily   Chronic pain syndrome 02/06/2015   CKD (chronic kidney disease), stage III (HCC)    Coronary artery disease    a. s/p IMI 2004 tx with BMS to RCA;  b. s/p Promus DES to RCA 2/12 c. abnormal nuc 2016 -> cath with med rx. d. NSTEMI 02/2020  mv CAD with severe ISR in pRCA and m/dRCA lesion both treated with DES.   Debility 08/01/2012   Dementia (HCC)    Depression    takes Cymbalta  daily   Diabetes mellitus    insulin  daily   DOE (dyspnea on exertion) 06/24/2014   Dysphagia, unspecified(787.20) 07/31/2012   Fungal dermatitis 11/13/2007   Qualifier: Diagnosis of  By: Minnette Amato  MD, Ronie Cohen    GERD (gastroesophageal reflux disease)    takes Protonix  daily   Gout, unspecified    Headache    occasionally   History of anemia due to chronic kidney disease 03/26/2022   History of blood clots    History of deep venous  thrombosis 01/27/2012   History of pulmonary embolus (PE) 04/22/2014   Hyperkalemia 07/26/2017   Hyperlipidemia    takes Pravastatin  daily   Hypertension    Insomnia    Joint pain    Joint swelling    Morbid obesity (HCC)    Multiple benign nevi 11/15/2016   Myocardial infarction (HCC) 2004   Non-STEMI (non-ST elevated myocardial infarction) (HCC) 02/15/2020   Numbness    Obstructive sleep apnea    does not wear cpap   Osteoarthritis    Osteoarthritis    Osteoarthrosis, unspecified whether generalized or localized, lower leg    Pain in the chest 12/31/2013   Pain, chronic    Personal history of other diseases of the digestive system 12/03/2006   Qualifier: Diagnosis of  By: Minnette Amato  MD, Ronie Cohen    Polymyalgia rheumatica [facility])    Presence of stent in right coronary artery    Pulmonary emboli (HCC) 01/2012   felt to need lifelong anticoagulation but Xarelto  dc in 02/2020 due to need for DAPT   Situational depression 06/04/2009   Qualifier: Diagnosis of  By: Minnette Amato  MD, Ronie Cohen    Syncope, vasovagal 07/04/2021   Type 2 diabetes mellitus with hyperlipidemia (HCC) 02/16/2022   Urinary incontinence    takes  Linzess  daily   Vaginitis and vulvovaginitis 06/23/2016   Vertigo    hx of;was taking Meclizine  if needed   Wears dentures    Past Surgical History:  Procedure Laterality Date   ABDOMINAL HYSTERECTOMY     partial   APPENDECTOMY     blood clots/legs and lungs  2013   BREAST BIOPSY Left 07/22/2014   BREAST BIOPSY Left 02/10/2013   BREAST LUMPECTOMY Left 11/05/2014   BREAST LUMPECTOMY WITH RADIOACTIVE SEED LOCALIZATION Left 11/05/2014   Procedure: LEFT BREAST LUMPECTOMY WITH RADIOACTIVE SEED LOCALIZATION;  Surgeon: Oza Blumenthal, MD;  Location: [facility] OR;  Service: General;  Laterality: Left;   CARDIAC CATHETERIZATION     COLONOSCOPY     CORONARY ANGIOPLASTY  2   CORONARY STENT INTERVENTION N/A 02/17/2020   Procedure: CORONARY STENT INTERVENTION;  Surgeon:  Arleen Lacer, MD;  Location: [facility] INVASIVE CV LAB;  Service: Cardiovascular;  Laterality: N/A;   ESOPHAGOGASTRODUODENOSCOPY (EGD) WITH PROPOFOL  N/A 11/07/2016   Procedure: ESOPHAGOGASTRODUODENOSCOPY (EGD) WITH PROPOFOL ;  Surgeon: Kenney Peacemaker, MD;  Location: [facility] ENDOSCOPY;  Service: Endoscopy;  Laterality: N/A;   ESOPHAGOGASTRODUODENOSCOPY (EGD) WITH PROPOFOL  N/A 01/05/2023   Procedure: ESOPHAGOGASTRODUODENOSCOPY (EGD) WITH PROPOFOL ;  Surgeon: Tobin Forts, MD;  Location: [facility] ENDOSCOPY;  Service: Gastroenterology;  Laterality: N/A;   EXCISION OF SKIN TAG Right 11/05/2014   Procedure: EXCISION OF RIGHT EYELID SKIN TAG;  Surgeon: Oza Blumenthal, MD;  Location: [facility] OR;  Service: General;  Laterality: Right;   EYE SURGERY Bilateral    cataract    GASTRIC BYPASS  1977    reversed in 1979, [facility]   LEFT HEART CATH AND CORONARY ANGIOGRAPHY N/A 02/17/2020   Procedure: LEFT HEART CATH AND CORONARY ANGIOGRAPHY;  Surgeon: Arleen Lacer, MD;  Location: [facility] INVASIVE CV LAB;  Service: Cardiovascular;  Laterality: N/A;   LEFT HEART CATHETERIZATION WITH CORONARY ANGIOGRAM N/A 06/29/2014   Procedure: LEFT HEART CATHETERIZATION WITH CORONARY ANGIOGRAM;  Surgeon: Millicent Ally, MD;  Location: [facility] CATH LAB;  Service: Cardiovascular;  Laterality: N/A;   MEMBRANE PEEL Right 10/23/2018   Procedure: MEMBRANE PEEL;  Surgeon: Shon Downing, MD;  Location: [facility] OR;  Service: Ophthalmology;  Laterality: Right;   MI with stent placement  2004   PARS PLANA VITRECTOMY Right 10/23/2018   Procedure: PARS PLANA VITRECTOMY WITH 25 GAUGE;  Surgeon: Shon Downing, MD;  Location: [facility] OR;  Service: Ophthalmology;  Laterality: Right;     Home Medications Prior to Admission medications   Medication Sig Start Date End Date Taking? Authorizing Provider  albuterol  (PROAIR  HFA) 108 (90 Base) MCG/ACT inhaler Inhale 1-2 puffs into the lungs every 6 (six) hours as needed for wheezing or shortness of breath. 12/19/18   Verma Gobble, NP  amitriptyline  (ELAVIL ) 75 MG tablet TAKE 1 TABLET BY MOUTH AT BEDTIME 09/11/23   Catheryn Cluck, MD  atorvastatin  (LIPITOR ) 80 MG tablet TAKE 1 TABLET BY MOUTH EVERY DAY Patient taking differently: Take 80 mg by mouth at bedtime. 12/04/22   Catheryn Cluck, MD  azelastine  (ASTELIN ) 0.1 % nasal spray Place 2 sprays into both nostrils 2 (two) times daily. 01/23/23   Catheryn Cluck, MD  butalbital -acetaminophen -caffeine  (FIORICET ) 50-325-40 MG tablet Take 1 tablet by mouth every 6 (six) hours as needed for headache. 06/08/23   Maylene Spear, MD  carvedilol  (COREG ) 12.5 MG tablet TAKE 1 TABLET BY MOUTH 2 TIMES DAILY 05/17/23   Catheryn Cluck, MD  cephALEXin  (KEFLEX ) 250 MG capsule Take 1  capsule (250 mg total) by mouth 2 (two) times daily for 7 days. 10/04/23 10/11/23  Gavin Kast, FNP  cetirizine  (ZYRTEC ) 10 MG tablet Take 10 mg by mouth daily as needed for allergies or rhinitis.    [provider]  Continuous Glucose Receiver (FREESTYLE LIBRE 3 READER) DEVI Continuous glucose monitor 06/21/23   Shamleffer, Julian Obey, MD  Continuous Glucose Sensor (FREESTYLE LIBRE 14 DAY SENSOR) MISC PLACE 1 DEVICE ON THE SKIN AS DIRECTED EVERY 14 DAYS 07/16/23   Shamleffer, Ibtehal Jaralla, MD  diclofenac  Sodium (VOLTAREN ) 1 % GEL Apply 2 g topically 4 (four) times daily. Patient taking differently: Apply 2 g topically 4 (four) times daily as needed (pain). 02/23/22   Catheryn Cluck, MD  docusate sodium  (COLACE) 100 MG capsule Take 2 capsules (200 mg total) by mouth daily. 12/19/22 12/14/23  Catheryn Cluck, MD  ELIQUIS  5 MG TABS tablet TAKE 1 TABLET BY MOUTH 2 TIMES DAILY 04/09/23   Catheryn Cluck, MD  febuxostat  (ULORIC ) 40 MG tablet TAKE 1 TABLET BY MOUTH EVERY DAY 02/22/23   Catheryn Cluck, MD  fluticasone  (FLONASE ) 50 MCG/ACT nasal spray Place 2 sprays into both nostrils daily. 06/12/23   Tonna Frederic, MD  Fluticasone -Umeclidin-Vilant (TRELEGY ELLIPTA ) 100-62.5-25  MCG/ACT AEPB Inhale 1 puff into the lungs daily. 01/10/23 01/05/24  Catheryn Cluck, MD  folic acid  (FOLVITE ) 1 MG tablet TAKE 1 TABLET BY MOUTH EVERY DAY 05/17/23   Catheryn Cluck, MD  glucagon  1 MG injection Inject 1 mg into the vein once as needed for up to 1 dose. 04/13/22   Catheryn Cluck, MD  glucose 4 GM chewable tablet Chew 1 tablet (4 g total) by mouth as needed for low blood sugar. 04/05/22   Catheryn Cluck, MD  hydrOXYzine  (ATARAX ) 25 MG tablet TAKE 1 TABLET BY MOUTH EVERY 8 HOURS AS NEEDED FOR ANXIETY OR ITCHING (NASAL AND THROAT CONGESTION) 06/05/23   Catheryn Cluck, MD  insulin  aspart (NOVOLOG  FLEXPEN) 100 UNIT/ML FlexPen Max daily 45 units 06/21/23   Shamleffer, Ibtehal Jaralla, MD  insulin  glargine, 2 Unit Dial , (TOUJEO  MAX SOLOSTAR) 300 UNIT/ML Solostar Pen Inject 28 Units into the skin daily before breakfast. 06/21/23   Shamleffer, Ibtehal Jaralla, MD  ipratropium-albuterol  (DUONEB) 0.5-2.5 (3) MG/3ML SOLN Take 3 mLs by nebulization every 6 (six) hours as needed (cough, wheezing, or shortness of breath). 01/10/23 06/05/23  Catheryn Cluck, MD  linaclotide  (LINZESS ) 145 MCG CAPS capsule Patient take 1 capsule daily.  May add an additional capsule for breakthrough constipation associated with opioid use Patient taking differently: Take 145 mcg by mouth every other day. 12/19/22   Catheryn Cluck, MD  meclizine  (ANTIVERT ) 25 MG tablet Take 1 tablet (25 mg total) by mouth 2 (two) times daily. 07/20/23   Catheryn Cluck, MD  melatonin 5 MG TABS Take 1 tablet (5 mg total) by mouth at bedtime as needed. 06/08/23   Krishnan, Gokul, MD  memantine  (NAMENDA ) 5 MG tablet TAKE 2 TABLETS BY MOUTH EVERY MORNING and TAKE 1 TABLET BY MOUTH EVERY EVENING Patient taking differently: Take 5-10 mg by mouth See admin instructions. Take 10 mg by mouth in the morning and 5 mg in the evening 10/10/22   Catheryn Cluck, MD  montelukast  (SINGULAIR ) 10 MG tablet Take 1 tablet (10 mg total) by mouth at  bedtime. 04/24/22   Catheryn Cluck, MD  nitroGLYCERIN  (NITROSTAT ) 0.4 MG SL tablet Dissolve 1 tablet under the tongue every 5  minutes as needed for chest pain. Max of 3 doses, then 911. 09/20/23   Nahser, Lela Purple, MD  nystatin  (MYCOSTATIN /NYSTOP ) powder Apply 1 Application topically 3 (three) times daily. 03/21/23   Gavin Kast, FNP  ondansetron  (ZOFRAN ) 4 MG tablet Take 1 tablet (4 mg total) by mouth every 8 (eight) hours as needed for nausea or vomiting. 06/29/23   Frankie Israel, MD  pantoprazole  (PROTONIX ) 20 MG tablet TAKE 1 TABLET BY MOUTH ONCE DAILY 10/05/23   Catheryn Cluck, MD  predniSONE  (DELTASONE ) 10 MG tablet Take 4 tablets (40 mg total) by mouth daily with breakfast. 09/25/23   Phebe Brasil, MD  predniSONE  (DELTASONE ) 20 MG tablet Take 3 tablets (60 mg total) by mouth daily. 09/13/23 10/13/23  [place] Bureau, PA-C  Semaglutide ,0.25 or 0.5MG /DOS, (OZEMPIC , 0.25 OR 0.5 MG/DOSE,) 2 MG/3ML SOPN Inject 0.5 mg into the skin once a week. 06/21/23   Shamleffer, Ibtehal Jaralla, MD  sodium zirconium cyclosilicate  (LOKELMA ) 10 g PACK packet Take 10 g by mouth 2 (two) times daily. 09/05/23 10/05/23  Onetha Bile, MD  torsemide  (DEMADEX ) 20 MG tablet TAKE 1 TABLET BY MOUTH EVERY DAY MAY TAKE AN EXTRA DOES FOR WEIGHT GAIN OF 3LBs IN 1 DAY OR 5LBS IN 1 WEEK OR FOR WORSENING SWELLING 10/05/23   Catheryn Cluck, MD  traMADol  (ULTRAM ) 50 MG tablet Take 1 tablet (50 mg total) by mouth every 12 (twelve) hours as needed. Take the first 4 doses with ZOfran  to minimize nausea Patient not taking: Reported on 10/04/2023 06/29/23   Frankie Israel, MD  TYLENOL  500 MG tablet Take 1,000 mg by mouth every 6 (six) hours as needed for mild pain or headache.    [provider]  TYLENOL  PM EXTRA STRENGTH 500-25 MG TABS tablet Take 2 tablets by mouth at bedtime.    [provider]  Vitamin D , Ergocalciferol , (DRISDOL ) 1.25 MG (50000 UNIT) CAPS capsule TAKE 1 CAPSULE BY MOUTH ONCE WEEKLY  08/09/23   Kale, Gautam Kishore, MD      Allergies    Sulfonamide derivatives, Duloxetine , Lokelma  [sodium zirconium cyclosilicate ], Aricept  [donepezil  hcl], and Tramadol     Review of Systems   Review of Systems  Physical Exam Updated Vital Signs BP (!) 152/49 (BP Location: Right Wrist)   Pulse 67   Temp 97.9 F (36.6 C) (Oral)   Resp 20   Wt 122 kg   SpO2 100%   BMI 42.13 kg/m  Physical Exam Vitals and nursing note reviewed.  Cardiovascular:     Rate and Rhythm: Regular rhythm.  Pulmonary:     Breath sounds: No wheezing or rhonchi.  Abdominal:     Tenderness: There is no abdominal tenderness.  Musculoskeletal:     Cervical back: Neck supple.  Skin:    General: Skin is warm.  Neurological:     Mental Status: She is alert. Mental status is at baseline.     ED Results / Procedures / Treatments   Labs (all labs ordered are listed, but only abnormal results are displayed) Labs Reviewed  COMPREHENSIVE METABOLIC PANEL WITH GFR - Abnormal; Notable for the following components:      Result Value   Sodium 128 (*)    Potassium 5.3 (*)    Chloride 90 (*)    Glucose, Bld 766 (*)    BUN 76 (*)    Creatinine, Ser 2.64 (*)    Calcium  8.5 (*)    Total Protein 6.4 (*)    Albumin  2.9 (*)  AST 13 (*)    GFR, Estimated 17 (*)    All other components within normal limits  CBC WITH DIFFERENTIAL/PLATELET - Abnormal; Notable for the following components:   WBC 14.7 (*)    RBC 2.98 (*)    Hemoglobin 8.9 (*)    HCT 27.0 (*)    Neutro Abs 13.6 (*)    Lymphs Abs 0.6 (*)    All other components within normal limits  CBG MONITORING, ED - Abnormal; Notable for the following components:   Glucose-Capillary >600 (*)    All other components within normal limits  I-STAT VENOUS BLOOD GAS, ED - Abnormal; Notable for the following components:   pO2, Ven 28 (*)    Bicarbonate 30.5 (*)    Acid-Base Excess 5.0 (*)    Sodium 127 (*)    Potassium 5.3 (*)    Calcium , Ion 1.12 (*)    HCT  29.0 (*)    Hemoglobin 9.9 (*)    All other components within normal limits  BETA-HYDROXYBUTYRIC ACID  URINALYSIS, ROUTINE W REFLEX MICROSCOPIC  BASIC METABOLIC PANEL WITH GFR  BASIC METABOLIC PANEL WITH GFR  BASIC METABOLIC PANEL WITH GFR  BASIC METABOLIC PANEL WITH GFR  OSMOLALITY  CBG MONITORING, ED    EKG EKG Interpretation Date/Time:  Friday Oct 05 2023 12:05:23 EDT Ventricular Rate:  65 PR Interval:  188 QRS Duration:  104 QT Interval:  424 QTC Calculation: 440 R Axis:   -12  Text Interpretation: Normal sinus rhythm Minimal voltage criteria for LVH, may be normal variant ( Cornell product ) Inferior infarct , age undetermined Abnormal ECG When compared with ECG of 12-Sep-2023 22:16,  t waves more prominant Confirmed by Mozell Arias [phone]) on 10/05/2023 12:31:31 PM  Radiology No results found.  Procedures Procedures    Medications Ordered in ED Medications  insulin  regular, human (MYXREDLIN) 100 units/ 100 mL infusion (has no administration in time range)  lactated ringers  infusion (has no administration in time range)  dextrose  5 % in lactated ringers  infusion (has no administration in time range)  dextrose  50 % solution 0-50 mL (has no administration in time range)  lactated ringers  bolus 1,000 mL (has no administration in time range)    ED Course/ Medical Decision Making/ A&P                                 Medical Decision Making Amount and/or Complexity of Data Reviewed Labs: ordered.  Risk Prescription drug management.   Patient with hyperglycemia.  Likely due to less insulin  use.  Potentially had UTI on dipstick urine yesterday although just had leukocytes.  No ketones yesterday.  Differential diagnosis does include DKA.  EKG shows some peaked T waves indicating possible hyperkalemia.  Potassium is mildly, however insulin  will help bring this down.  Creatinine is slightly above baseline.  Sugar is above 700.  Normal anion gap and normal beta  hydroxy butyric acid.  Will admit to hospitalist  CRITICAL CARE Performed by: Mozell Arias Total critical care time: 30 minutes Critical care time was exclusive of separately billable procedures and treating other patients. Critical care was necessary to treat or prevent imminent or life-threatening deterioration. Critical care was time spent personally by me on the following activities: development of treatment plan with patient and/or surrogate as well as nursing, discussions with consultants, evaluation of patient's response to treatment, examination of patient, obtaining history from patient or surrogate, ordering and  performing treatments and interventions, ordering and review of laboratory studies, ordering and review of radiographic studies, pulse oximetry and re-evaluation of patient's condition.        Final Clinical Impression(s) / ED Diagnoses Final diagnoses:  Hyperglycemia    Rx / DC Orders ED Discharge Orders     None         Mozell Arias, MD 10/05/23 1331

## 2023-10-05 NOTE — ED Provider Triage Note (Signed)
 Emergency Medicine Provider Triage Evaluation Note  CRYSTLE CARELLI , a 89 y.o. female  was evaluated in triage.  Pt complains of hyperglycemia..  Review of Systems  Positive: High sugars Negative: Fevers  Physical Exam  BP (!) 152/49 (BP Location: Right Wrist)   Temp 97.9 F (36.6 C) (Oral)   Resp 20   Wt 122 kg   SpO2 100%   BMI 42.13 kg/m  Well-appearing.  Medical Decision Making  Medically screening exam initiated at 11:31 AM.  Appropriate orders placed.  BRENA WINDSOR was informed that the remainder of the evaluation will be completed by another provider, this initial triage assessment does not replace that evaluation, and the importance of remaining in the ED until their evaluation is complete.  Patient with hyperglycemia.  Has been doing less insulin  since her meter was broke.  Saw by PCP yesterday day but do not see that her sugar was checked although urine showed some leukocytes and started for infection.  Did have glucose in the urine but no ketones.    Mozell Arias, MD 10/05/23 [phone]

## 2023-10-05 NOTE — H&P (Signed)
 History and Physical    Patient: [name]:[id number] DOB: 08-05-1934 DOA: 10/05/2023 DOS: the patient was seen and examined on 10/05/2023 PCP: Catheryn Cluck, MD  Patient coming from: Home  Chief Complaint:  Chief Complaint  Patient presents with   Hyperglycemia   HPI: [name] is a 89 y.o. female with medical history significant of breast cancer, morbid obesity, OSA, DM2, HTN, HLD, CAD/stents, CHF, CKD, chronic anemia, DVT/PE on chronic anticoagulation, and anxiety/depression p/w hyperglycemia iso steroid for presumed temporal arteritis.  Pt was in her USOH until she presented to her PCP for her previously scheduled potassium shot and was advised to report to the ED for elevated blood sugars. Pt states that her blood sugars have been elevated since starting her steroids since her last admission; of note, pt was evaluated for chronic HA on 5/7 and 5/8 and had elevated inflammatory markers (CRP 1.4 and ESR 87); as such, pt was started on prednisone  60mg  daily per Neuro recs for presumed temporal arteritis. Pt subsequently followed up with Neuro on 5/20 and her dose was decreased to 40mg  daily with plan to decrease by 10mg  weekly until on 10mg  daily.  In the ED, pt was hypertensive. Labs notable for Na 127, K 5.3, Cr 2.64, glucose 766, and WBC 14.7. UA positive for LE, but neg for nitrite. Pt admitted to medicine for ongoing care.  Review of Systems: As mentioned in the history of present illness. All other systems reviewed and are negative. Past Medical History:  Diagnosis Date   Abdominal pain 01/26/2012   Acute deep vein thrombosis (DVT) of femoral vein of right lower extremity (HCC) 03/26/2022   Acute kidney injury superimposed on chronic kidney disease (HCC) 12/18/2014   Acute upper GI bleed 03/26/2022   Allergy     takes Mucinex  daily as needed   Anemia, unspecified    Anxiety    takes Clonazepam  daily as needed   Anxiety associated with depression 06/08/2017    Arthritis    Breast cancer (HCC)    Breast cancer screening 02/19/2014   Breast mass 10/27/2014   Chronic diastolic CHF (congestive heart failure) (HCC)    takes Furosemide  daily   Chronic pain syndrome 02/06/2015   CKD (chronic kidney disease), stage III (HCC)    Coronary artery disease    a. s/p IMI 2004 tx with BMS to RCA;  b. s/p Promus DES to RCA 2/12 c. abnormal nuc 2016 -> cath with med rx. d. NSTEMI 02/2020  mv CAD with severe ISR in pRCA and m/dRCA lesion both treated with DES.   Debility 08/01/2012   Dementia (HCC)    Depression    takes Cymbalta  daily   Diabetes mellitus    insulin  daily   DOE (dyspnea on exertion) 06/24/2014   Dysphagia, unspecified(787.20) 07/31/2012   Fungal dermatitis 11/13/2007   Qualifier: Diagnosis of  By: Minnette Amato  MD, Ronie Cohen    GERD (gastroesophageal reflux disease)    takes Protonix  daily   Gout, unspecified    Headache    occasionally   History of anemia due to chronic kidney disease 03/26/2022   History of blood clots    History of deep venous thrombosis 01/27/2012   History of pulmonary embolus (PE) 04/22/2014   Hyperkalemia 07/26/2017   Hyperlipidemia    takes Pravastatin  daily   Hypertension    Insomnia    Joint pain    Joint swelling    Morbid obesity (HCC)    Multiple benign nevi 11/15/2016  Myocardial infarction (HCC) 2004   Non-STEMI (non-ST elevated myocardial infarction) (HCC) 02/15/2020   Numbness    Obstructive sleep apnea    does not wear cpap   Osteoarthritis    Osteoarthritis    Osteoarthrosis, unspecified whether generalized or localized, lower leg    Pain in the chest 12/31/2013   Pain, chronic    Personal history of other diseases of the digestive system 12/03/2006   Qualifier: Diagnosis of  By: Minnette Amato  MD, Ronie Cohen    Polymyalgia rheumatica [facility])    Presence of stent in right coronary artery    Pulmonary emboli (HCC) 01/2012   felt to need lifelong anticoagulation but Xarelto  dc in 02/2020 due to  need for DAPT   Situational depression 06/04/2009   Qualifier: Diagnosis of  By: Minnette Amato  MD, Ronie Cohen    Syncope, vasovagal 07/04/2021   Type 2 diabetes mellitus with hyperlipidemia (HCC) 02/16/2022   Urinary incontinence    takes Linzess  daily   Vaginitis and vulvovaginitis 06/23/2016   Vertigo    hx of;was taking Meclizine  if needed   Wears dentures    Past Surgical History:  Procedure Laterality Date   ABDOMINAL HYSTERECTOMY     partial   APPENDECTOMY     blood clots/legs and lungs  2013   BREAST BIOPSY Left 07/22/2014   BREAST BIOPSY Left 02/10/2013   BREAST LUMPECTOMY Left 11/05/2014   BREAST LUMPECTOMY WITH RADIOACTIVE SEED LOCALIZATION Left 11/05/2014   Procedure: LEFT BREAST LUMPECTOMY WITH RADIOACTIVE SEED LOCALIZATION;  Surgeon: Oza Blumenthal, MD;  Location: [facility] OR;  Service: General;  Laterality: Left;   CARDIAC CATHETERIZATION     COLONOSCOPY     CORONARY ANGIOPLASTY  2   CORONARY STENT INTERVENTION N/A 02/17/2020   Procedure: CORONARY STENT INTERVENTION;  Surgeon: Arleen Lacer, MD;  Location: [facility] INVASIVE CV LAB;  Service: Cardiovascular;  Laterality: N/A;   ESOPHAGOGASTRODUODENOSCOPY (EGD) WITH PROPOFOL  N/A 11/07/2016   Procedure: ESOPHAGOGASTRODUODENOSCOPY (EGD) WITH PROPOFOL ;  Surgeon: Kenney Peacemaker, MD;  Location: [facility] ENDOSCOPY;  Service: Endoscopy;  Laterality: N/A;   ESOPHAGOGASTRODUODENOSCOPY (EGD) WITH PROPOFOL  N/A 01/05/2023   Procedure: ESOPHAGOGASTRODUODENOSCOPY (EGD) WITH PROPOFOL ;  Surgeon: Tobin Forts, MD;  Location: [facility] ENDOSCOPY;  Service: Gastroenterology;  Laterality: N/A;   EXCISION OF SKIN TAG Right 11/05/2014   Procedure: EXCISION OF RIGHT EYELID SKIN TAG;  Surgeon: Oza Blumenthal, MD;  Location: [facility] OR;  Service: General;  Laterality: Right;   EYE SURGERY Bilateral    cataract    GASTRIC BYPASS  1977    reversed in 1979, [facility]   LEFT HEART CATH AND CORONARY ANGIOGRAPHY N/A 02/17/2020   Procedure: LEFT HEART CATH AND CORONARY  ANGIOGRAPHY;  Surgeon: Arleen Lacer, MD;  Location: [facility] INVASIVE CV LAB;  Service: Cardiovascular;  Laterality: N/A;   LEFT HEART CATHETERIZATION WITH CORONARY ANGIOGRAM N/A 06/29/2014   Procedure: LEFT HEART CATHETERIZATION WITH CORONARY ANGIOGRAM;  Surgeon: Millicent Ally, MD;  Location: [facility] CATH LAB;  Service: Cardiovascular;  Laterality: N/A;   MEMBRANE PEEL Right 10/23/2018   Procedure: MEMBRANE PEEL;  Surgeon: Shon Downing, MD;  Location: [facility] OR;  Service: Ophthalmology;  Laterality: Right;   MI with stent placement  2004   PARS PLANA VITRECTOMY Right 10/23/2018   Procedure: PARS PLANA VITRECTOMY WITH 25 GAUGE;  Surgeon: Shon Downing, MD;  Location: [facility] OR;  Service: Ophthalmology;  Laterality: Right;   Social History:  reports that she has never smoked. She has been exposed to tobacco smoke.  She has never used smokeless tobacco. She reports that she does not drink alcohol and does not use drugs.  Allergies  Allergen Reactions   Sulfonamide Derivatives Swelling and Other (See Comments)    Mouth swelling- no resp issues noted   Duloxetine      Headaches   Lokelma  [Sodium Zirconium Cyclosilicate ] Other (See Comments)    Headache, stomach upset   Aricept  [Donepezil  Hcl] Diarrhea, Nausea And Vomiting and Other (See Comments)    GI upset/loose stools   Tramadol  Nausea And Vomiting    Family History  Problem Relation Age of Onset   Breast cancer Mother 84   Heart disease Mother    Throat cancer Father    Hypertension Father    Arthritis Father    Diabetes Father    Arthritis Sister    Obesity Sister    Diabetes Sister    Heart disease Cousin    Colon cancer Neg Hx    Stomach cancer Neg Hx    Esophageal cancer Neg Hx     Prior to Admission medications   Medication Sig Start Date End Date Taking? Authorizing Provider  albuterol  (PROAIR  HFA) 108 (90 Base) MCG/ACT inhaler Inhale 1-2 puffs into the lungs every 6 (six) hours as needed for wheezing or shortness of breath. 12/19/18   Yes Verma Gobble, NP  amitriptyline  (ELAVIL ) 75 MG tablet TAKE 1 TABLET BY MOUTH AT BEDTIME 09/11/23  Yes Catheryn Cluck, MD  atorvastatin  (LIPITOR ) 80 MG tablet TAKE 1 TABLET BY MOUTH EVERY DAY Patient taking differently: Take 80 mg by mouth at bedtime. 12/04/22  Yes Catheryn Cluck, MD  azelastine  (ASTELIN ) 0.1 % nasal spray Place 2 sprays into both nostrils 2 (two) times daily. 01/23/23  Yes Catheryn Cluck, MD  butalbital -acetaminophen -caffeine  (FIORICET ) 50-325-40 MG tablet Take 1 tablet by mouth every 6 (six) hours as needed for headache. 06/08/23  Yes Maylene Spear, MD  carvedilol  (COREG ) 12.5 MG tablet TAKE 1 TABLET BY MOUTH 2 TIMES DAILY 05/17/23  Yes Catheryn Cluck, MD  cephALEXin  (KEFLEX ) 250 MG capsule Take 1 capsule (250 mg total) by mouth 2 (two) times daily for 7 days. 10/04/23 10/11/23 Yes Gavin Kast, FNP  cetirizine  (ZYRTEC ) 10 MG tablet Take 10 mg by mouth daily as needed for allergies or rhinitis.   Yes [provider]  Continuous Glucose Receiver (FREESTYLE LIBRE 3 READER) DEVI Continuous glucose monitor 06/21/23  Yes Shamleffer, Ibtehal Jaralla, MD  Continuous Glucose Sensor (FREESTYLE LIBRE 14 DAY SENSOR) MISC PLACE 1 DEVICE ON THE SKIN AS DIRECTED EVERY 14 DAYS 07/16/23  Yes Shamleffer, Julian Obey, MD  ELIQUIS  5 MG TABS tablet TAKE 1 TABLET BY MOUTH 2 TIMES DAILY 04/09/23  Yes Catheryn Cluck, MD  febuxostat  (ULORIC ) 40 MG tablet TAKE 1 TABLET BY MOUTH EVERY DAY 02/22/23  Yes Catheryn Cluck, MD  fluticasone  (FLONASE ) 50 MCG/ACT nasal spray Place 2 sprays into both nostrils daily. 06/12/23  Yes Tonna Frederic, MD  Fluticasone -Umeclidin-Vilant (TRELEGY ELLIPTA ) 100-62.5-25 MCG/ACT AEPB Inhale 1 puff into the lungs daily. 01/10/23 01/05/24 Yes Catheryn Cluck, MD  folic acid  (FOLVITE ) 1 MG tablet TAKE 1 TABLET BY MOUTH EVERY DAY 05/17/23  Yes Catheryn Cluck, MD  glucagon  1 MG injection Inject 1 mg into the vein once as needed for up to 1 dose.  04/13/22  Yes Catheryn Cluck, MD  glucose 4 GM chewable tablet Chew 1 tablet (4 g total) by mouth as needed for low blood sugar. 04/05/22  Yes Catheryn Cluck, MD  hydrOXYzine  (ATARAX ) 25 MG tablet TAKE 1 TABLET BY MOUTH EVERY 8 HOURS AS NEEDED FOR ANXIETY OR ITCHING (NASAL AND THROAT CONGESTION) 06/05/23  Yes Catheryn Cluck, MD  insulin  aspart (NOVOLOG  FLEXPEN) 100 UNIT/ML FlexPen Max daily 45 units 06/21/23  Yes Shamleffer, Ibtehal Jaralla, MD  insulin  glargine, 2 Unit Dial , (TOUJEO  MAX SOLOSTAR) 300 UNIT/ML Solostar Pen Inject 28 Units into the skin daily before breakfast. 06/21/23  Yes Shamleffer, Ibtehal Jaralla, MD  ipratropium-albuterol  (DUONEB) 0.5-2.5 (3) MG/3ML SOLN Take 3 mLs by nebulization every 6 (six) hours as needed (cough, wheezing, or shortness of breath). 01/10/23 10/05/23 Yes Catheryn Cluck, MD  meclizine  (ANTIVERT ) 25 MG tablet Take 1 tablet (25 mg total) by mouth 2 (two) times daily. 07/20/23  Yes Catheryn Cluck, MD  melatonin 5 MG TABS Take 1 tablet (5 mg total) by mouth at bedtime as needed. 06/08/23  Yes Maylene Spear, MD  memantine  (NAMENDA ) 5 MG tablet TAKE 2 TABLETS BY MOUTH EVERY MORNING and TAKE 1 TABLET BY MOUTH EVERY EVENING Patient taking differently: Take 5-10 mg by mouth See admin instructions. Take 10 mg by mouth in the morning and 5 mg in the evening 10/10/22  Yes Catheryn Cluck, MD  montelukast  (SINGULAIR ) 10 MG tablet Take 1 tablet (10 mg total) by mouth at bedtime. 04/24/22  Yes Catheryn Cluck, MD  nitroGLYCERIN  (NITROSTAT ) 0.4 MG SL tablet Dissolve 1 tablet under the tongue every 5 minutes as needed for chest pain. Max of 3 doses, then 911. 09/20/23  Yes Nahser, Lela Purple, MD  ondansetron  (ZOFRAN ) 4 MG tablet Take 1 tablet (4 mg total) by mouth every 8 (eight) hours as needed for nausea or vomiting. 06/29/23  Yes Frankie Israel, MD  pantoprazole  (PROTONIX ) 20 MG tablet TAKE 1 TABLET BY MOUTH ONCE DAILY 10/05/23  Yes Catheryn Cluck, MD  predniSONE   (DELTASONE ) 10 MG tablet Take 4 tablets (40 mg total) by mouth daily with breakfast. 09/25/23  Yes Phebe Brasil, MD  Semaglutide ,0.25 or 0.5MG /DOS, (OZEMPIC , 0.25 OR 0.5 MG/DOSE,) 2 MG/3ML SOPN Inject 0.5 mg into the skin once a week. 06/21/23  Yes Shamleffer, Ibtehal Jaralla, MD  sodium zirconium cyclosilicate  (LOKELMA ) 10 g PACK packet Take 10 g by mouth 2 (two) times daily. 09/05/23 10/05/23 Yes Countryman, Chase, MD  torsemide  (DEMADEX ) 20 MG tablet TAKE 1 TABLET BY MOUTH EVERY DAY MAY TAKE AN EXTRA DOES FOR WEIGHT GAIN OF 3LBs IN 1 DAY OR 5LBS IN 1 WEEK OR FOR WORSENING SWELLING 10/05/23  Yes Catheryn Cluck, MD  TYLENOL  500 MG tablet Take 1,000 mg by mouth every 6 (six) hours as needed for mild pain or headache.   Yes [provider]  Vitamin D , Ergocalciferol , (DRISDOL ) 1.25 MG (50000 UNIT) CAPS capsule TAKE 1 CAPSULE BY MOUTH ONCE WEEKLY 08/09/23  Yes Kale, Gautam Kishore, MD  diclofenac  Sodium (VOLTAREN ) 1 % GEL Apply 2 g topically 4 (four) times daily. Patient taking differently: Apply 2 g topically 4 (four) times daily as needed (pain). 02/23/22   Catheryn Cluck, MD  docusate sodium  (COLACE) 100 MG capsule Take 2 capsules (200 mg total) by mouth daily. Patient not taking: Reported on 10/05/2023 12/19/22 12/14/23  Catheryn Cluck, MD  linaclotide  (LINZESS ) 145 MCG CAPS capsule Patient take 1 capsule daily.  May add an additional capsule for breakthrough constipation associated with opioid use Patient not taking: Reported on 10/05/2023 12/19/22   Catheryn Cluck, MD  nystatin  (MYCOSTATIN /NYSTOP )  powder Apply 1 Application topically 3 (three) times daily. 03/21/23   Gavin Kast, FNP  predniSONE  (DELTASONE ) 20 MG tablet Take 3 tablets (60 mg total) by mouth daily. Patient not taking: Reported on 10/05/2023 09/13/23 10/13/23  Antelope Bureau, PA-C  traMADol  (ULTRAM ) 50 MG tablet Take 1 tablet (50 mg total) by mouth every 12 (twelve) hours as needed. Take the first 4 doses with ZOfran  to  minimize nausea Patient not taking: Reported on 10/04/2023 06/29/23   Frankie Israel, MD  TYLENOL  PM EXTRA STRENGTH 500-25 MG TABS tablet Take 2 tablets by mouth at bedtime.    [provider]    Physical Exam: Vitals:   10/05/23 1604 10/05/23 1620 10/05/23 1700 10/05/23 1715  BP:   (!) 101/57 (!) 85/54  Pulse:   72 73  Resp:   (!) 21 (!) 22  Temp:  98.2 F (36.8 C)    TempSrc:  Oral    SpO2:   100% 100%  Weight: 122 kg     Height: 5\' 7"  (1.702 m)      General: Alert, oriented x3, resting comfortably in no acute distress Respiratory: Lungs clear to auscultation bilaterally with normal respiratory effort; no w/r/r Cardiovascular: Regular rate and rhythm w/o m/r/g   Data Reviewed:  Lab Results  Component Value Date   WBC 14.7 (H) 10/05/2023   HGB 9.9 (L) 10/05/2023   HCT 29.0 (L) 10/05/2023   MCV 90.6 10/05/2023   PLT 160 10/05/2023   Lab Results  Component Value Date   GLUCOSE 766 (HH) 10/05/2023   CALCIUM  8.5 (L) 10/05/2023   NA 127 (L) 10/05/2023   K 5.3 (H) 10/05/2023   CO2 27 10/05/2023   CL 90 (L) 10/05/2023   BUN 76 (H) 10/05/2023   CREATININE 2.64 (H) 10/05/2023   Lab Results  Component Value Date   ALT 18 10/05/2023   AST 13 (L) 10/05/2023   ALKPHOS 59 10/05/2023   BILITOT 0.6 10/05/2023   Lab Results  Component Value Date   INR 1.8 (H) 03/27/2022   INR 2.0 (H) 03/26/2022   INR 1.2 10/23/2018   PROTIME 18.9 (A) 11/14/2016    Radiology: No results found.  Assessment and Plan: 67F h/o breast cancer, morbid obesity, OSA, DM2, HTN, HLD, CAD/stents, HFpEF, CKD, chronic anemia, DVT/PE on chronic anticoagulation, and anxiety/depression p/w hyperglycemia iso presumed UTI.  Hyperglycemia (not DKA) Steroid use for presumed temporal arteritis. Suspect elevated glucose 2/2 steroid use. Glucose >700. Ketones and pH wnl.  -Endotool per protocol -NPO for now -Consider IP eval/referral to diabetes RN prior to d/c to optimize pta medications  and advise insulin  dosing while titrating off prednisone  -Continue prednisone  taper 40mg  x7d-->30mg  x7d-->20mg  x7d--> 10mg  daily (may consider 5mg  x7d prior to d/c altogether if pt wishes to do so) - Pt will need insulin  taper in conjunction with steroid taper and has been advised to have PCP f/u weekly until off prednisone  (NOTE: Neuro advises remaining on Pred 10mg  daily, but given elevated blood sugars pt and family are adamant about d/c altogether and following up with OP Neuro if HA reoccurs as pt is amenable to temporal artery biopsy in the future if needed).  Presumed UTI UA nondiagnostic; prior Proteus and E. coli UTI with resistance to macrobid  and bactrim/ciprofloxacin  respectively -S/p fosfomycin 3g x1; CTM  H/o DVT/PE -PTA apixaban  5mg  BID  HFpEF -PTA coreg  and torsemide   Advance Care Planning:   Code Status: Full Code   Consults: N/A  Family Communication: Updated  daughter at bedside  Severity of Illness: The appropriate patient status for this patient is INPATIENT. Inpatient status is judged to be reasonable and necessary in order to provide the required intensity of service to ensure the patient's safety. [name] presenting symptoms, physical exam findings, and initial radiographic and laboratory data in the context of their chronic comorbidities is felt to place them at high risk for further clinical deterioration. Furthermore, it is not anticipated that the patient will be medically stable for discharge from the hospital within 2 midnights of admission.   * I certify that at the point of admission it is my clinical judgment that the patient will require inpatient hospital care spanning beyond 2 midnights from the point of admission due to high intensity of service, high risk for further deterioration and high frequency of surveillance required.*   ------- I spent 55 minutes reviewing previous labs/notes, obtaining separate history at the bedside,  counseling/discussing the treatment plan outlined above, ordering medications/tests, and performing clinical documentation.  Author: Arne Langdon, MD 10/05/2023 5:29 PM  For on call review [email or web address].

## 2023-10-05 NOTE — ED Notes (Signed)
 Paging physician out to alert that PT has been between the goal range for 2 hrs.

## 2023-10-06 DIAGNOSIS — I10 Essential (primary) hypertension: Secondary | ICD-10-CM

## 2023-10-06 DIAGNOSIS — N184 Chronic kidney disease, stage 4 (severe): Secondary | ICD-10-CM

## 2023-10-06 DIAGNOSIS — M316 Other giant cell arteritis: Secondary | ICD-10-CM

## 2023-10-06 LAB — BASIC METABOLIC PANEL WITH GFR
Anion gap: 13 (ref 5–15)
BUN: 70 mg/dL — ABNORMAL HIGH (ref 8–23)
CO2: 25 mmol/L (ref 22–32)
Calcium: 8.3 mg/dL — ABNORMAL LOW (ref 8.9–10.3)
Chloride: 97 mmol/L — ABNORMAL LOW (ref 98–111)
Creatinine, Ser: 2.44 mg/dL — ABNORMAL HIGH (ref 0.44–1.00)
GFR, Estimated: 19 mL/min — ABNORMAL LOW (ref >60)
Glucose, Bld: 256 mg/dL — ABNORMAL HIGH (ref 70–99)
Potassium: 4.3 mmol/L (ref 3.5–5.1)
Sodium: 135 mmol/L (ref 135–145)

## 2023-10-06 LAB — URINE CULTURE
MICRO NUMBER:: 16514464
SPECIMEN QUALITY:: ADEQUATE

## 2023-10-06 LAB — GLUCOSE, CAPILLARY
Glucose-Capillary: 191 mg/dL — ABNORMAL HIGH (ref 70–99)
Glucose-Capillary: 280 mg/dL — ABNORMAL HIGH (ref 70–99)
Glucose-Capillary: 306 mg/dL — ABNORMAL HIGH (ref 70–99)
Glucose-Capillary: 398 mg/dL — ABNORMAL HIGH (ref 70–99)
Glucose-Capillary: 402 mg/dL — ABNORMAL HIGH (ref 70–99)
Glucose-Capillary: 417 mg/dL — ABNORMAL HIGH (ref 70–99)

## 2023-10-06 MED ORDER — ACETAMINOPHEN 325 MG PO TABS: 650.0000 mg | ORAL_TABLET | Freq: Four times a day (QID) | ORAL | Status: DC | PRN

## 2023-10-06 MED ORDER — INSULIN GLARGINE-YFGN 100 UNIT/ML ~~LOC~~ SOLN: 25.0000 [IU] | Freq: Every day | SUBCUTANEOUS | Status: DC

## 2023-10-06 MED ORDER — INSULIN ASPART 100 UNIT/ML IJ SOLN
15.0000 [IU] | Freq: Once | INTRAMUSCULAR | Status: AC
  Administered 2023-10-06: 15 [IU] via SUBCUTANEOUS

## 2023-10-06 MED ORDER — INSULIN GLARGINE-YFGN 100 UNIT/ML ~~LOC~~ SOLN
25.0000 [IU] | Freq: Every day | SUBCUTANEOUS | Status: DC
  Filled 2023-10-06 (×2): qty 0.25

## 2023-10-06 MED ORDER — TRAMADOL HCL 50 MG PO TABS
50.0000 mg | ORAL_TABLET | Freq: Two times a day (BID) | ORAL | Status: DC | PRN
  Administered 2023-10-06: 50 mg via ORAL
  Filled 2023-10-06: qty 1

## 2023-10-06 MED ORDER — INSULIN ASPART 100 UNIT/ML IJ SOLN
4.0000 [IU] | Freq: Three times a day (TID) | INTRAMUSCULAR | Status: DC
  Administered 2023-10-06 – 2023-10-07 (×3): 4 [IU] via SUBCUTANEOUS

## 2023-10-06 MED ORDER — MELATONIN 5 MG PO TABS
5.0000 mg | ORAL_TABLET | Freq: Every evening | ORAL | Status: DC | PRN
  Administered 2023-10-06: 5 mg via ORAL
  Filled 2023-10-06: qty 1

## 2023-10-06 MED ORDER — INSULIN GLARGINE-YFGN 100 UNIT/ML ~~LOC~~ SOPN
12.0000 [IU] | PEN_INJECTOR | Freq: Every day | SUBCUTANEOUS | Status: DC
  Filled 2023-10-06: qty 3

## 2023-10-06 MED ORDER — INSULIN ASPART 100 UNIT/ML IJ SOLN
20.0000 [IU] | Freq: Once | INTRAMUSCULAR | Status: AC
  Administered 2023-10-06: 20 [IU] via SUBCUTANEOUS

## 2023-10-06 MED ORDER — INSULIN ASPART 100 UNIT/ML IJ SOLN
22.0000 [IU] | Freq: Once | INTRAMUSCULAR | Status: AC
  Administered 2023-10-06: 22 [IU] via SUBCUTANEOUS

## 2023-10-06 MED ORDER — INSULIN GLARGINE-YFGN 100 UNIT/ML ~~LOC~~ SOLN
12.0000 [IU] | Freq: Every day | SUBCUTANEOUS | Status: DC
  Administered 2023-10-06: 12 [IU] via SUBCUTANEOUS
  Filled 2023-10-06: qty 0.12

## 2023-10-06 MED ORDER — HYDROXYZINE HCL 25 MG PO TABS
25.0000 mg | ORAL_TABLET | Freq: Three times a day (TID) | ORAL | Status: DC | PRN
  Administered 2023-10-06: 25 mg via ORAL
  Filled 2023-10-06: qty 1

## 2023-10-06 MED ORDER — SODIUM CHLORIDE 0.9 % IV BOLUS
250.0000 mL | Freq: Once | INTRAVENOUS | Status: AC
  Administered 2023-10-06: 250 mL via INTRAVENOUS

## 2023-10-06 NOTE — Plan of Care (Signed)
 Pt has rested quietly throughout the night with no distress noted. Alert and oriented. ON room air. SR on the monitor. Voids per [facility] with heavy assist X2. No complaints voiced.     Problem: Clinical Measurements: Goal: Ability to maintain clinical measurements within normal limits will improve Outcome: Progressing Goal: Cardiovascular complication will be avoided Outcome: Progressing   Problem: Pain Managment: Goal: General experience of comfort will improve and/or be controlled Outcome: Progressing   Problem: Safety: Goal: Ability to remain free from injury will improve Outcome: Progressing

## 2023-10-06 NOTE — Plan of Care (Signed)

## 2023-10-06 NOTE — Progress Notes (Signed)
 PROGRESS NOTE        PATIENT DETAILS Name: ZOEE HEENEY Age: 89 y.o. Sex: female Date of Birth: [birth year] Admit Date: 10/05/2023 Admitting Physician Lena Qualia, MD AOZ:[id number], Lea Primmer, MD  Brief Summary: Patient is a 89 y.o.  female who was recently diagnosed with temporal arteritis (based on clinical features-biopsy not done)-started on steroids-brought to the hospital for uncontrolled hyperglycemia-found to have hyperglycemic hyperosmolar state-started on  insulin  infusion and admitted to the hospitalist service.  Significant events: 5/30>> admit to [facility]-insulin  infusion 5/31>> transitioned off insulin  infusion earlier this morning/last night.  Significant studies: None  Significant microbiology data: None  Procedures: None  Consults: None  Subjective: Lying comfortably in bed-denies any chest pain or shortness of breath.   Objective: Vitals: Blood pressure (!) 107/46, pulse 67, temperature 98.8 F (37.1 C), temperature source Oral, resp. rate 19, height 5\' 7"  (1.702 m), weight 117.7 kg, SpO2 94%.   Exam: Gen Exam:Alert awake-not in any distress HEENT:atraumatic, normocephalic Chest: B/L clear to auscultation anteriorly CVS:S1S2 regular Abdomen:soft non tender, non distended Extremities:no edema Neurology: Non focal Skin: no rash  Pertinent Labs/Radiology:    Latest Ref Rng & Units 10/05/2023   10:48 PM 10/05/2023   11:56 AM 10/05/2023   11:32 AM  CBC  WBC 4.0 - 10.5 K/uL 18.7   14.7   Hemoglobin 12.0 - 15.0 g/dL 8.8  9.9  8.9   Hematocrit 36.0 - 46.0 % 27.2  29.0  27.0   Platelets 150 - 400 K/uL 166   160     Lab Results  Component Value Date   NA 135 10/06/2023   K 4.3 10/06/2023   CL 97 (L) 10/06/2023   CO2 25 10/06/2023     Assessment/Plan: Hyperglycemic hyperosmolar state DM-2 with uncontrolled hyperglycemia-likely due to steroids (A1c 9.4 on 5/20) Off insulin  infusion since last night/early this  morning-and given 20 units of Semglee . Apparently normally takes 28 units of Toujeo -along with SSI Since she came in with severe hyperglycemia-and was transitioned to just 20 units of Semglee  (lower than usual dose)-I will plan to continue to monitor for another day-increase Semglee  to 25 units in a.m.-add 12 units of Semglee  at night.  Monitor closely and see how she does.  History of VTE Eliquis   Chronic HFpEF Euvolemic Coreg /torsemide   CKD stage IV Close to baseline Trend electrolytes periodically  HLD Statin  Hyponatremia Likely pseudohyponatremia in the setting of hyperglycemia Sodium levels much better after correction of hyperglycemia Trend electrolytes periodically  Recent diagnosis of temporal arteritis Remains on prednisone  Resume outpatient follow-up with [facility] neurology postdischarge.  History of stage Ia invasive ductal carcinoma-s/p lumpectomy Continue outpatient follow-up with oncology  COPD/asthma Stable Bronchodilators  OSA Reportedly noncompliant with CPAP.  Class 3 Obesity Estimated body mass index is 40.64 kg/m as calculated from the following:   Height as of this encounter: 5\' 7"  (1.702 m).   Weight as of this encounter: 117.7 kg.   Code status:   Code Status: Full Code   DVT Prophylaxis: apixaban  (ELIQUIS ) tablet 5 mg     Family Communication: None at bedside   Disposition Plan: Status is: Inpatient Remains inpatient appropriate because: Severity of illness   Planned Discharge Destination:Home   Diet: Diet Order             Diet Carb Modified Fluid consistency: Thin; Room service appropriate?  Yes  Diet effective now                     Antimicrobial agents: Anti-infectives (From admission, onward)    Start     Dose/Rate Route Frequency Ordered Stop   10/05/23 1630  fosfomycin (MONUROL ) packet 3 g        3 g Oral  Once 10/05/23 1622 10/05/23 1857        MEDICATIONS: Scheduled Meds:  amitriptyline   75 mg  Oral QHS   apixaban   5 mg Oral BID   atorvastatin   80 mg Oral Daily   budesonide -glycopyrrolate -formoterol   2 puff Inhalation BID   carvedilol   12.5 mg Oral BID   insulin  aspart  0-15 Units Subcutaneous TID WC   insulin  aspart  4 Units Subcutaneous TID WC   [START ON 10/07/2023] insulin  glargine-yfgn  25 Units Subcutaneous Daily   insulin  glargine-yfgn  12 Units Subcutaneous QHS   pantoprazole   20 mg Oral Daily   predniSONE   40 mg Oral Q breakfast   torsemide   20 mg Oral Daily   Continuous Infusions: PRN Meds:.dextrose    I have personally reviewed following labs and imaging studies  LABORATORY DATA: CBC: Recent Labs  Lab 10/05/23 0901 10/05/23 1132 10/05/23 1156 10/05/23 2248  WBC 12.5* 14.7*  --  18.7*  NEUTROABS 11.6* 13.6*  --   --   HGB 9.0* 8.9* 9.9* 8.8*  HCT 27.8* 27.0* 29.0* 27.2*  MCV 89.4 90.6  --  89.8  PLT 168 160  --  166    Basic Metabolic Panel: Recent Labs  Lab 10/05/23 0901 10/05/23 1132 10/05/23 1156 10/06/23 0224  NA 129* 128* 127* 135  K 4.9 5.3* 5.3* 4.3  CL 90* 90*  --  97*  CO2 30 27  --  25  GLUCOSE 717* 766*  --  256*  BUN 80* 76*  --  70*  CREATININE 2.67* 2.64*  --  2.44*  CALCIUM  8.6* 8.5*  --  8.3*    GFR: Estimated Creatinine Clearance: 21.9 mL/min (A) (by C-G formula based on SCr of 2.44 mg/dL (H)).  Liver Function Tests: Recent Labs  Lab 10/05/23 0901 10/05/23 1132  AST 9* 13*  ALT 16 18  ALKPHOS 76 59  BILITOT 0.6 0.6  PROT 6.5 6.4*  ALBUMIN  3.4* 2.9*   No results for input(s): "LIPASE", "AMYLASE" in the last 168 hours. No results for input(s): "AMMONIA" in the last 168 hours.  Coagulation Profile: No results for input(s): "INR", "PROTIME" in the last 168 hours.  Cardiac Enzymes: No results for input(s): "CKTOTAL", "CKMB", "CKMBINDEX", "TROPONINI" in the last 168 hours.  BNP (last 3 results) No results for input(s): "PROBNP" in the last 8760 hours.  Lipid Profile: No results for input(s): "CHOL", "HDL",  "LDLCALC", "TRIG", "CHOLHDL", "LDLDIRECT" in the last 72 hours.  Thyroid  Function Tests: No results for input(s): "TSH", "T4TOTAL", "FREET4", "T3FREE", "THYROIDAB" in the last 72 hours.  Anemia Panel: No results for input(s): "VITAMINB12", "FOLATE", "FERRITIN", "TIBC", "IRON ", "RETICCTPCT" in the last 72 hours.  Urine analysis:    Component Value Date/Time   COLORURINE YELLOW 09/13/2023 0300   APPEARANCEUR HAZY (A) 09/13/2023 0300   LABSPEC 1.008 09/13/2023 0300   PHURINE 5.0 09/13/2023 0300   GLUCOSEU NEGATIVE 09/13/2023 0300   HGBUR NEGATIVE 09/13/2023 0300   BILIRUBINUR neg 10/04/2023 1117   KETONESUR NEGATIVE 09/13/2023 0300   PROTEINUR Positive (A) 10/04/2023 1117   PROTEINUR NEGATIVE 09/13/2023 0300   UROBILINOGEN 0.2 10/04/2023 1117  UROBILINOGEN 0.2 02/16/2018 1514   NITRITE neg 10/04/2023 1117   NITRITE NEGATIVE 09/13/2023 0300   LEUKOCYTESUR Small (1+) (A) 10/04/2023 1117   LEUKOCYTESUR SMALL (A) 09/13/2023 0300    Sepsis Labs: Lactic Acid, Venous    Component Value Date/Time   LATICACIDVEN 1.2 02/08/2022 2112    MICROBIOLOGY: No results found for this or any previous visit (from the past 240 hours).  RADIOLOGY STUDIES/RESULTS: No results found.   LOS: 1 day   Kimberly Penna, MD  [facility]    To contact the attending provider between 7A-7P or the covering provider during after hours 7P-7A, please log into the web site [email or web address] and access using universal [place] password for that web site. If you do not have the password, please call the hospital operator.  10/06/2023, 9:19 AM

## 2023-10-06 NOTE — Progress Notes (Signed)
 [facility] night cross cover note:   I was notified by the patient's RN  of pt's cbg of 398 at 2130, relative to previous glucose level of 402 at 1630, following administration of interval 26 units subcutaneous NovoLog .  She is scheduled for 12 units of evening Semglee , but, as curretnly ordered will not receive any additional short acting insulin  until the morning.  I subsequently ordered Novolog  15 units sq x 1 dose now, with repeat CBG to be checked in 1 hour.  Repeat CBG showed interval increase in glucose level to 417.  It is noted that this patient is on prednisone  therapy at this time.  I then ordered an additional 20 units of sq novolog  x 1 , along with a small iv fluid bolus, and a repeat cbg to be checked in about an hour.   Repeat cbg now trending down to 316.  4 AM CBG noted to be 215.     Additionally, per patient request, I have resumed her outpatient prn hydroxyzine  for anxiety as well as her outpatient prn melatonin for insomnia.    Camelia Cavalier, DO Hospitalist

## 2023-10-07 LAB — BASIC METABOLIC PANEL WITH GFR
Anion gap: 11 (ref 5–15)
BUN: 78 mg/dL — ABNORMAL HIGH (ref 8–23)
CO2: 26 mmol/L (ref 22–32)
Calcium: 8.2 mg/dL — ABNORMAL LOW (ref 8.9–10.3)
Chloride: 96 mmol/L — ABNORMAL LOW (ref 98–111)
Creatinine, Ser: 2.73 mg/dL — ABNORMAL HIGH (ref 0.44–1.00)
GFR, Estimated: 16 mL/min — ABNORMAL LOW (ref >60)
Glucose, Bld: 308 mg/dL — ABNORMAL HIGH (ref 70–99)
Potassium: 4.4 mmol/L (ref 3.5–5.1)
Sodium: 133 mmol/L — ABNORMAL LOW (ref 135–145)

## 2023-10-07 LAB — GLUCOSE, CAPILLARY
Glucose-Capillary: 316 mg/dL — ABNORMAL HIGH (ref 70–99)
Glucose-Capillary: 215 mg/dL — ABNORMAL HIGH (ref 70–99)
Glucose-Capillary: 150 mg/dL — ABNORMAL HIGH (ref 70–99)

## 2023-10-07 MED ORDER — TOUJEO MAX SOLOSTAR 300 UNIT/ML ~~LOC~~ SOPN: 22.0000 [IU] | PEN_INJECTOR | Freq: Two times a day (BID) | SUBCUTANEOUS | Status: DC

## 2023-10-07 MED ORDER — NOVOLOG FLEXPEN 100 UNIT/ML ~~LOC~~ SOPN: PEN_INJECTOR | SUBCUTANEOUS | Status: DC

## 2023-10-07 MED ORDER — TORSEMIDE 20 MG PO TABS: 10.0000 mg | ORAL_TABLET | Freq: Every day | ORAL | Status: DC

## 2023-10-07 MED ORDER — INSULIN GLARGINE-YFGN 100 UNIT/ML ~~LOC~~ SOLN
22.0000 [IU] | Freq: Two times a day (BID) | SUBCUTANEOUS | Status: DC
  Administered 2023-10-07: 22 [IU] via SUBCUTANEOUS
  Filled 2023-10-07 (×3): qty 0.22

## 2023-10-07 NOTE — Discharge Summary (Signed)
 [name] UJW:[id number] DOB: 1934-10-05 DOA: 10/05/2023  PCP: Catheryn Cluck, MD  Admit date: 10/05/2023  Discharge date: 10/07/2023  Admitted From: Home   Disposition:  Home   Recommendations for Outpatient Follow-up:   Follow up with PCP in 1-2 weeks  PCP Please obtain BMP/CBC, 2 view CXR in 1week,  (see Discharge instructions)   PCP Please follow up on the following pending results:    Home Health: None   Equipment/Devices: None  Consultations: None Discharge Condition: Stable     CODE STATUS: Full    Diet Recommendation: Heart Healthy Low Carb    Chief Complaint  Patient presents with   Hyperglycemia     Brief history of present illness from the day of admission and additional interim summary    89 y.o.  female who was recently diagnosed with temporal arteritis (based on clinical features-biopsy not done)-started on steroids-brought to the hospital for uncontrolled hyperglycemia-found to have hyperglycemic hyperosmolar state-started on  insulin  infusion and admitted to the hospitalist service.   Significant events: 5/30>> admit to [facility]-insulin  infusion 5/31>> transitioned off insulin  infusion earlier this morning/last night.                                                                 Hospital Course   Hyperglycemic hyperosmolar state DM-2 with uncontrolled hyperglycemia-likely due to steroids (A1c 9.4 on 5/20) Off insulin  infusion since last night/early this morning-and given 20 units of Semglee . She was recently placed on moderate to high dose of oral steroid for temporal arteritis, likely causing elevation in her blood sugars.  Insulin  long-acting and sliding scale both adjusted, sugars stable will be discharged home on the higher dose.  Requested to check CBGs q. [facility] maintain a  logbook and show to PCP within 7 days for further adjustment.  PCP will need to monitor insulin  dosage closely as steroids get tapered by neurology.  She is completely symptom-free this morning wants to be discharged home, daughter bedside agrees.   History of VTE Eliquis    Chronic HFpEF Euvolemic Coreg /torsemide  as needed as before   CKD stage IV Close to baseline PCP to monitor   HLD Statin    Recent diagnosis of temporal arteritis Remains on prednisone  Resume outpatient follow-up with [facility] neurology postdischarge.   History of stage Ia invasive ductal carcinoma-s/p lumpectomy Continue outpatient follow-up with oncology   COPD/asthma Stable Bronchodilators   OSA Reportedly noncompliant with CPAP.  Counseled, to follow-up with PCP.   Class 3 Obesity Estimated body mass index is 40.64 kg/m follow-up with PCP in the outpatient setting    Discharge diagnosis     Principal Problem:   Hyperglycemia due to diabetes mellitus [facility])    Discharge instructions    Discharge Instructions     Discharge instructions  Complete by: As directed    Follow with Primary MD Catheryn Cluck, MD in 7 days   Get CBC, CMP, 2 view Chest X ray -  checked next visit with your primary MD    Activity: As tolerated with Full fall precautions use walker/cane & assistance as needed  Disposition Home   Diet: Heart Healthy Low Carb  Accuchecks 4 times/day, Once in AM empty stomach and then before each meal. Log in all results and show them to your Prim.MD in 3 days. If any glucose reading is under 80 or above 300 call your Prim MD immidiately. Follow Low glucose instructions for glucose under 80 as instructed.  Special Instructions: If you have smoked or chewed Tobacco  in the last 2 yrs please stop smoking, stop any regular Alcohol  and or any Recreational drug use.  On your next visit with your primary care physician please Get Medicines reviewed and adjusted.  Please  request your Prim.MD to go over all Hospital Tests and Procedure/Radiological results at the follow up, please get all Hospital records sent to your Prim MD by signing hospital release before you go home.  If you experience worsening of your admission symptoms, develop shortness of breath, life threatening emergency, suicidal or homicidal thoughts you must seek medical attention immediately by calling 911 or calling your MD immediately  if symptoms less severe.  You Must read complete instructions/literature along with all the possible adverse reactions/side effects for all the Medicines you take and that have been prescribed to you. Take any new Medicines after you have completely understood and accpet all the possible adverse reactions/side effects.   Do not drive when taking Pain medications.  Do not take more than prescribed Pain, Sleep and Anxiety Medications  Wear Seat belts while driving.   Increase activity slowly   Complete by: As directed        Discharge Medications   Allergies as of 10/07/2023       Reactions   Sulfonamide Derivatives Swelling, Other (See Comments)   Mouth swelling- no resp issues noted   Duloxetine     Headaches   Lokelma  [sodium Zirconium Cyclosilicate ] Other (See Comments)   Headache, stomach upset   Aricept  [donepezil  Hcl] Diarrhea, Nausea And Vomiting, Other (See Comments)   GI upset/loose stools   Tramadol  Nausea And Vomiting        Medication List     STOP taking these medications    linaclotide  145 MCG Caps capsule Commonly known as: LINZESS    Lokelma  10 g Pack packet Generic drug: sodium zirconium cyclosilicate    traMADol  50 MG tablet Commonly known as: ULTRAM        TAKE these medications    albuterol  108 (90 Base) MCG/ACT inhaler Commonly known as: ProAir  HFA Inhale 1-2 puffs into the lungs every 6 (six) hours as needed for wheezing or shortness of breath.   amitriptyline  75 MG tablet Commonly known as: ELAVIL  TAKE 1 TABLET  BY MOUTH AT BEDTIME   atorvastatin  80 MG tablet Commonly known as: LIPITOR  TAKE 1 TABLET BY MOUTH EVERY DAY What changed: when to take this   azelastine  0.1 % nasal spray Commonly known as: ASTELIN  Place 2 sprays into both nostrils 2 (two) times daily.   butalbital -acetaminophen -caffeine  50-325-40 MG tablet Commonly known as: FIORICET  Take 1 tablet by mouth every 6 (six) hours as needed for headache.   carvedilol  12.5 MG tablet Commonly known as: COREG  TAKE 1 TABLET BY MOUTH 2 TIMES DAILY   cephALEXin  250  MG capsule Commonly known as: KEFLEX  Take 1 capsule (250 mg total) by mouth 2 (two) times daily for 7 days.   cetirizine  10 MG tablet Commonly known as: ZYRTEC  Take 10 mg by mouth daily as needed for allergies or rhinitis.   diclofenac  Sodium 1 % Gel Commonly known as: Voltaren  Apply 2 g topically 4 (four) times daily. What changed:  when to take this reasons to take this   Eliquis  5 MG Tabs tablet Generic drug: apixaban  TAKE 1 TABLET BY MOUTH 2 TIMES DAILY   febuxostat  40 MG tablet Commonly known as: ULORIC  TAKE 1 TABLET BY MOUTH EVERY DAY   fluticasone  50 MCG/ACT nasal spray Commonly known as: FLONASE  Place 2 sprays into both nostrils daily.   folic acid  1 MG tablet Commonly known as: FOLVITE  TAKE 1 TABLET BY MOUTH EVERY DAY   FreeStyle Libre 14 Day Sensor Misc PLACE 1 DEVICE ON THE SKIN AS DIRECTED EVERY 14 DAYS   FreeStyle Libre 3 Reader Devi Continuous glucose monitor   glucagon  1 MG injection Inject 1 mg into the vein once as needed for up to 1 dose.   glucose 4 GM chewable tablet Chew 1 tablet (4 g total) by mouth as needed for low blood sugar.   hydrOXYzine  25 MG tablet Commonly known as: ATARAX  TAKE 1 TABLET BY MOUTH EVERY 8 HOURS AS NEEDED FOR ANXIETY OR ITCHING (NASAL AND THROAT CONGESTION)   ipratropium-albuterol  0.5-2.5 (3) MG/3ML Soln Commonly known as: DUONEB Take 3 mLs by nebulization every 6 (six) hours as needed (cough, wheezing,  or shortness of breath).   meclizine  25 MG tablet Commonly known as: ANTIVERT  Take 1 tablet (25 mg total) by mouth 2 (two) times daily.   melatonin 5 MG Tabs Take 1 tablet (5 mg total) by mouth at bedtime as needed.   memantine  5 MG tablet Commonly known as: NAMENDA  TAKE 2 TABLETS BY MOUTH EVERY MORNING and TAKE 1 TABLET BY MOUTH EVERY EVENING What changed:  how much to take how to take this when to take this additional instructions   montelukast  10 MG tablet Commonly known as: SINGULAIR  Take 1 tablet (10 mg total) by mouth at bedtime.   nitroGLYCERIN  0.4 MG SL tablet Commonly known as: Nitrostat  Dissolve 1 tablet under the tongue every 5 minutes as needed for chest pain. Max of 3 doses, then 911.   NovoLOG  FlexPen 100 UNIT/ML FlexPen Generic drug: insulin  aspart Before each meal 3 times a day, 140-199 - 2 units, 200-250 - 6 units, 251-299 - 8 units,  300-349 - 12 units,  350 or above 14 units. What changed: additional instructions   nystatin  powder Commonly known as: MYCOSTATIN /NYSTOP  Apply 1 Application topically 3 (three) times daily.   ondansetron  4 MG tablet Commonly known as: Zofran  Take 1 tablet (4 mg total) by mouth every 8 (eight) hours as needed for nausea or vomiting.   Ozempic  (0.25 or 0.5 MG/DOSE) 2 MG/3ML Sopn Generic drug: Semaglutide (0.25 or 0.5MG /DOS) Inject 0.5 mg into the skin once a week.   pantoprazole  20 MG tablet Commonly known as: PROTONIX  TAKE 1 TABLET BY MOUTH ONCE DAILY   predniSONE  10 MG tablet Commonly known as: DELTASONE  Take 4 tablets (40 mg total) by mouth daily with breakfast. What changed: Another medication with the same name was removed. Continue taking this medication, and follow the directions you see here.   torsemide  20 MG tablet Commonly known as: DEMADEX  TAKE 1 TABLET BY MOUTH EVERY DAY MAY TAKE AN EXTRA DOES FOR WEIGHT  GAIN OF 3LBs IN 1 DAY OR 5LBS IN 1 WEEK OR FOR WORSENING SWELLING   Toujeo  Max SoloStar 300 UNIT/ML  Solostar Pen Generic drug: insulin  glargine (2 Unit Dial ) Inject 22 Units into the skin 2 (two) times daily. What changed:  how much to take when to take this   Trelegy Ellipta  100-62.5-25 MCG/ACT Aepb Generic drug: Fluticasone -Umeclidin-Vilant Inhale 1 puff into the lungs daily.   TYLENOL  500 MG tablet Generic drug: acetaminophen  Take 1,000 mg by mouth every 6 (six) hours as needed for mild pain or headache.   Tylenol  PM Extra Strength 500-25 MG Tabs tablet Generic drug: diphenhydramine -acetaminophen  Take 2 tablets by mouth at bedtime.   Vitamin D  (Ergocalciferol ) 1.25 MG (50000 UNIT) Caps capsule Commonly known as: DRISDOL  TAKE 1 CAPSULE BY MOUTH ONCE WEEKLY         Follow-up Information     Catheryn Cluck, MD. Schedule an appointment as soon as possible for a visit in 1 week(s).   Specialty: Family Medicine Why: Follow-up with your neurologist within 7 to 10 days Contact information: [address] [place] [address] [phone]                 Major procedures and Radiology Reports - PLEASE review detailed and final reports thoroughly  -       CT Head Wo Contrast Result Date: 09/13/2023 CLINICAL DATA:  Increasing headaches EXAM: CT HEAD WITHOUT CONTRAST TECHNIQUE: Contiguous axial images were obtained from the base of the skull through the vertex without intravenous contrast. RADIATION DOSE REDUCTION: This exam was performed according to the departmental dose-optimization program which includes automated exposure control, adjustment of the mA and/or kV according to patient size and/or use of iterative reconstruction technique. COMPARISON:  06/04/2023 FINDINGS: Brain: No evidence of acute infarction, hemorrhage, hydrocephalus, extra-axial collection or mass lesion/mass effect. Chronic atrophic and ischemic changes are noted. Vascular: No hyperdense vessel or unexpected calcification. Skull: Normal. Negative for fracture or focal lesion.  Sinuses/Orbits: No acute finding. Other: None IMPRESSION: Chronic atrophic and ischemic changes.  No acute abnormality noted. Electronically Signed   By: Violeta Grey M.D.   On: 09/13/2023 01:29   Today   Subjective    Pearlean Botts today has no headache,no chest abdominal pain,no new weakness tingling or numbness, feels much better wants to go home today.     Objective   Blood pressure 117/72, pulse 71, temperature 97.7 F (36.5 C), temperature source Oral, resp. rate 17, height 5\' 7"  (1.702 m), weight 117.7 kg, SpO2 97%.   Intake/Output Summary (Last 24 hours) at 10/07/2023 0900 Last data filed at 10/07/2023 0349 Gross per 24 hour  Intake --  Output 400 ml  Net -400 ml    Exam  Awake Alert, No new F.N deficits,    .AT,PERRAL Supple Neck,   Symmetrical Chest wall movement, Good air movement bilaterally, CTAB RRR,No Gallops,   +ve B.Sounds, Abd Soft, Non tender,  No Cyanosis, Clubbing or edema    Data Review   Recent Labs  Lab 10/05/23 0901 10/05/23 1132 10/05/23 1156 10/05/23 2248  WBC 12.5* 14.7*  --  18.7*  HGB 9.0* 8.9* 9.9* 8.8*  HCT 27.8* 27.0* 29.0* 27.2*  PLT 168 160  --  166  MCV 89.4 90.6  --  89.8  MCH 28.9 29.9  --  29.0  MCHC 32.4 33.0  --  32.4  RDW 13.2 13.4  --  13.5  LYMPHSABS 0.5* 0.6*  --   --  MONOABS 0.3 0.4  --   --   EOSABS 0.0 0.0  --   --   BASOSABS 0.0 0.0  --   --     Recent Labs  Lab 10/05/23 0901 10/05/23 1132 10/05/23 1156 10/06/23 0224 10/07/23 0224  NA 129* 128* 127* 135 133*  K 4.9 5.3* 5.3* 4.3 4.4  CL 90* 90*  --  97* 96*  CO2 30 27  --  25 26  ANIONGAP 9 11  --  13 11  GLUCOSE 717* 766*  --  256* 308*  BUN 80* 76*  --  70* 78*  CREATININE 2.67* 2.64*  --  2.44* 2.73*  AST 9* 13*  --   --   --   ALT 16 18  --   --   --   ALKPHOS 76 59  --   --   --   BILITOT 0.6 0.6  --   --   --   ALBUMIN  3.4* 2.9*  --   --   --   CALCIUM  8.6* 8.5*  --  8.3* 8.2*    Total Time in preparing paper work, data evaluation and  todays exam - 35 minutes  Signature  -    Lynnwood Sauer M.D on 10/07/2023 at 9:00 AM   -  To page go to [email or web address]

## 2023-10-07 NOTE — Progress Notes (Signed)
 Reviewed AVS, patient and family member in room expressed understanding of medications, MD follow up reviewed.   Removed IV, Site clean, dry and intact.  Patient states all belongings brought to the hospital at time of admission are accounted for and packed to take home.  No discharge prescriptions to pickup.  Pt transported to entrance where family member was waiting in vehicle to transport home.

## 2023-10-07 NOTE — Discharge Instructions (Signed)
 Follow with Primary MD Catheryn Cluck, MD in 7 days   Get CBC, CMP, 2 view Chest X ray -  checked next visit with your primary MD    Activity: As tolerated with Full fall precautions use walker/cane & assistance as needed  Disposition Home   Diet: Heart Healthy Low Carb  Accuchecks 4 times/day, Once in AM empty stomach and then before each meal. Log in all results and show them to your Prim.MD in 3 days. If any glucose reading is under 80 or above 300 call your Prim MD immidiately. Follow Low glucose instructions for glucose under 80 as instructed.  Special Instructions: If you have smoked or chewed Tobacco  in the last 2 yrs please stop smoking, stop any regular Alcohol  and or any Recreational drug use.  On your next visit with your primary care physician please Get Medicines reviewed and adjusted.  Please request your Prim.MD to go over all Hospital Tests and Procedure/Radiological results at the follow up, please get all Hospital records sent to your Prim MD by signing hospital release before you go home.  If you experience worsening of your admission symptoms, develop shortness of breath, life threatening emergency, suicidal or homicidal thoughts you must seek medical attention immediately by calling 911 or calling your MD immediately  if symptoms less severe.  You Must read complete instructions/literature along with all the possible adverse reactions/side effects for all the Medicines you take and that have been prescribed to you. Take any new Medicines after you have completely understood and accpet all the possible adverse reactions/side effects.   Do not drive when taking Pain medications.  Do not take more than prescribed Pain, Sleep and Anxiety Medications  Wear Seat belts while driving.

## 2023-10-08 ENCOUNTER — Encounter (HOSPITAL_COMMUNITY): Payer: Self-pay

## 2023-10-08 ENCOUNTER — Encounter: Payer: Self-pay | Admitting: Hematology

## 2023-10-08 ENCOUNTER — Ambulatory Visit: Payer: Self-pay

## 2023-10-08 NOTE — Telephone Encounter (Signed)
 Relayed information to daughter. She states she has the sliding scale in the ED AVS. States pt's BS is better. Pt scheduled for sooner appt with PCP than 12/11/23.

## 2023-10-08 NOTE — Telephone Encounter (Signed)
  Chief Complaint: BS >350 - Daughter is asking for increase in insulin  order while pt on steroids Symptoms: blurred vision Frequency: this am  Disposition: [] ED /[] Urgent Care (no appt availability in office) / [] Appointment(In office/virtual)/ []  [facility]/ [] Home Care/ [] Refused Recommended Disposition /[] [facility]/ []  Follow-up with PCP Additional Notes: Called [facility] and spoke with  Raoul Byes RN clinical supervisor and will route top DOD-  Copied from CRM [id number]. Topic: Clinical - Red Word Triage >> Oct 08, 2023 10:47 AM [name] wrote: [facility] that prompted transfer to Nurse Triage: Patient daughter called in stated patient blood sugar is up again, over 350 because it will not read on the meter Reason for Disposition  [1] Blood glucose > 300 mg/dL (91.4 mmol/L) AND [7] two or more times in a row  Answer Assessment - Initial Assessment Questions 1. BLOOD GLUCOSE: "What is your blood glucose level?"      >350  2. ONSET: "When did you check the blood glucose?"     392 6. INSULIN : "Do you take insulin ?" "What type of insulin (s) do you use? What is the mode of delivery? (syringe, pen; injection or pump)?"      Yes 7. DIABETES PILLS: "Do you take any pills for your diabetes?" If Yes, ask: "Have you missed taking any pills recently?"     *No Answer* 8. OTHER SYMPTOMS: "Do you have any symptoms?" (e.g., fever, frequent urination, difficulty breathing, dizziness, weakness, vomiting)    Blurred vision  Protocols used: Diabetes - High Blood Sugar-A-AH

## 2023-10-09 ENCOUNTER — Encounter: Payer: Self-pay | Admitting: Family Medicine

## 2023-10-09 ENCOUNTER — Ambulatory Visit: Admitting: Family Medicine

## 2023-10-09 ENCOUNTER — Telehealth: Payer: Self-pay

## 2023-10-09 VITALS — BP 101/61 | HR 72 | Temp 97.7°F | Resp 18 | Wt 269.0 lb

## 2023-10-09 DIAGNOSIS — Z794 Long term (current) use of insulin: Secondary | ICD-10-CM | POA: Diagnosis not present

## 2023-10-09 DIAGNOSIS — E66813 Obesity, class 3: Secondary | ICD-10-CM

## 2023-10-09 DIAGNOSIS — R0982 Postnasal drip: Secondary | ICD-10-CM

## 2023-10-09 DIAGNOSIS — J4489 Other specified chronic obstructive pulmonary disease: Secondary | ICD-10-CM | POA: Diagnosis not present

## 2023-10-09 DIAGNOSIS — R1310 Dysphagia, unspecified: Secondary | ICD-10-CM | POA: Diagnosis not present

## 2023-10-09 DIAGNOSIS — J441 Chronic obstructive pulmonary disease with (acute) exacerbation: Secondary | ICD-10-CM

## 2023-10-09 DIAGNOSIS — H6993 Unspecified Eustachian tube disorder, bilateral: Secondary | ICD-10-CM

## 2023-10-09 DIAGNOSIS — E1122 Type 2 diabetes mellitus with diabetic chronic kidney disease: Secondary | ICD-10-CM

## 2023-10-09 DIAGNOSIS — R0602 Shortness of breath: Secondary | ICD-10-CM | POA: Diagnosis not present

## 2023-10-09 DIAGNOSIS — N184 Chronic kidney disease, stage 4 (severe): Secondary | ICD-10-CM | POA: Diagnosis not present

## 2023-10-09 DIAGNOSIS — Z6841 Body Mass Index (BMI) 40.0 and over, adult: Secondary | ICD-10-CM

## 2023-10-09 DIAGNOSIS — I10 Essential (primary) hypertension: Secondary | ICD-10-CM | POA: Diagnosis not present

## 2023-10-09 DIAGNOSIS — E113393 Type 2 diabetes mellitus with moderate nonproliferative diabetic retinopathy without macular edema, bilateral: Secondary | ICD-10-CM | POA: Diagnosis not present

## 2023-10-09 DIAGNOSIS — I5032 Chronic diastolic (congestive) heart failure: Secondary | ICD-10-CM | POA: Diagnosis not present

## 2023-10-09 DIAGNOSIS — Z09 Encounter for follow-up examination after completed treatment for conditions other than malignant neoplasm: Secondary | ICD-10-CM

## 2023-10-09 MED ORDER — FLUTICASONE PROPIONATE 50 MCG/ACT NA SUSP: 2.0000 | Freq: Every day | NASAL | 6 refills | Status: DC

## 2023-10-09 MED ORDER — IPRATROPIUM-ALBUTEROL 0.5-2.5 (3) MG/3ML IN SOLN: 3.0000 mL | Freq: Four times a day (QID) | RESPIRATORY_TRACT | 0 refills | Status: AC | PRN

## 2023-10-09 MED ORDER — RYBELSUS 7 MG PO TABS: 7.0000 mg | ORAL_TABLET | Freq: Every day | ORAL | 1 refills | Status: DC

## 2023-10-09 MED ORDER — IPRATROPIUM-ALBUTEROL 0.5-2.5 (3) MG/3ML IN SOLN
3.0000 mL | Freq: Once | RESPIRATORY_TRACT | Status: AC
  Administered 2023-10-09: 3 mL via RESPIRATORY_TRACT

## 2023-10-09 MED ORDER — AZELASTINE HCL 0.1 % NA SOLN: 2.0000 | Freq: Two times a day (BID) | NASAL | 12 refills | Status: DC

## 2023-10-09 NOTE — Progress Notes (Signed)
 Assessment & Plan   Assessment/Plan:    Assessment & Plan Temporal Arteritis Recent flare treated with a steroid taper, resulting in significant improvement in vision and headache. Steroids are necessary to reduce inflammation and prevent complications such as blood clots and vision loss, but can exacerbate diabetes and cause mood changes. She is on a tapering schedule, expected to discontinue in three weeks. Gradual taper is essential to avoid adverse effects. - Continue steroid taper as prescribed - Monitor for headache and vision changes - Educate on potential mood changes due to steroids  Diabetes Mellitus Type 2 with Hyperglycemic Hyperosmolar State Recent hospitalization for hyperglycemic hyperosmolar state due to steroid-induced hyperglycemia. Current A1c is 9.4. Blood sugars remain elevated, with continuous glucose monitoring showing levels in the 200s and 300s. She is on Toujeo  and NovoLog . Discontinued Ozempic  due to side effects and will start Rybelsus to help manage blood sugar levels without causing hypoglycemia. Close monitoring is essential to prevent rehospitalization. - Start Rybelsus for blood sugar control - Continue current insulin  regimen with sliding scale aspart - Follow up with endocrinology for diabetes management - Monitor blood sugars closely - Recheck CMP, BMP, and CBC - Coordinate with pharmacy team for diabetes management  Chronic Obstructive Pulmonary Disease (COPD) Increased respiratory effort and raspy breathing, possibly due to COPD exacerbation. Uses Trelegy daily and has a nebulizer at home. Recent in-office breathing treatment improved symptoms. Nebulizer treatments at home may help improve breathing. Flonase  has been effective in alleviating nasal congestion and improving breathing. - Administer DuoNeb breathing treatment in office - Refill nebulizer medication for home use - Refill Flonase  for nasal congestion - Monitor respiratory  status  Dysphagia Worsening dysphagia with sensation of food and medication getting stuck in the throat. No associated heartburn or significant pain reported. Further evaluation may be required if symptoms persist. - Monitor symptoms of dysphagia - Consider further evaluation if symptoms persist      Medications Discontinued During This Encounter  Medication Reason   Semaglutide ,0.25 or 0.5MG /DOS, (OZEMPIC , 0.25 OR 0.5 MG/DOSE,) 2 MG/3ML SOPN    ipratropium-albuterol  (DUONEB) 0.5-2.5 (3) MG/3ML SOLN Reorder   azelastine  (ASTELIN ) 0.1 % nasal spray Reorder   fluticasone  (FLONASE ) 50 MCG/ACT nasal spray Reorder    Return if symptoms worsen or fail to improve.        Subjective:   Encounter date: 10/09/2023  [name] is a 89 y.o. female who has Hyperlipidemia; Class 3 obesity; Symptomatic anemia; TENSION HEADACHE; Essential hypertension; Nonintractable headache; History of DVT (deep vein thrombosis); Chronic diastolic CHF (congestive heart failure) (HCC); Long term current use of anticoagulant therapy; Weakness; Insomnia; Estrogen deficiency; COPD with chronic bronchitis (HCC); Drug-induced constipation; Vertigo; Gout; Acute kidney injury superimposed on chronic kidney disease (HCC); Vitamin D  deficiency; Endometrial polyp; GERD with esophagitis; Dementia (HCC); Type 2 diabetes mellitus with hyperlipidemia (HCC); Moderate nonproliferative diabetic retinopathy of both eyes without macular edema associated with type 2 diabetes mellitus (HCC); Chronic radicular lumbar pain; Lumbar spondylosis; Spinal stenosis, lumbar region, with neurogenic claudication; Bilateral primary osteoarthritis of knee; Iron  deficiency anemia; Chronic pain of right hand; Breast cancer (HCC); Severe episode of recurrent major depressive disorder, without psychotic features (HCC); Pneumobilia; Dermatosis papulosa nigra; CKD (chronic kidney disease), stage IV (HCC); Type 2 diabetes mellitus with stage 4 chronic  kidney disease, with long-term current use of insulin  (HCC); Type 2 diabetes mellitus with diabetic neuropathy, with long-term current use of insulin  (HCC); COPD with acute exacerbation (HCC); GAD (generalized anxiety disorder); Allergic rhinitis; Diabetic peripheral neuropathy  associated with type 2 diabetes mellitus (HCC); Swelling of right upper extremity; Carpal tunnel syndrome on both sides; Very limited skin mobility; Chronic left hip pain; Dependent for medication administration; Chills; Upper GI bleed; CAD (coronary artery disease); Epigastric abdominal tenderness; Chronic ethmoidal sinusitis; ABLA (acute blood loss anemia); Recurrent acute suppurative otitis media of right ear without spontaneous rupture of tympanic membrane; Viral URI with cough; Concha bullosa; Tinnitus of both ears; PND (post-nasal drip); Limited mobility; Hyperkalemia; Whole body pain; and Hyperglycemia due to diabetes mellitus (HCC) on their problem list..   She  has a past medical history of Abdominal pain (01/26/2012), Acute deep vein thrombosis (DVT) of femoral vein of right lower extremity (HCC) (03/26/2022), Acute kidney injury superimposed on chronic kidney disease (HCC) (12/18/2014), Acute upper GI bleed (03/26/2022), Allergy , Anemia, unspecified, Anxiety, Anxiety associated with depression (06/08/2017), Arthritis, Breast cancer (HCC), Breast cancer screening (02/19/2014), Breast mass (10/27/2014), Chronic diastolic CHF (congestive heart failure) (HCC), Chronic pain syndrome (02/06/2015), CKD (chronic kidney disease), stage III (HCC), Coronary artery disease, Debility (08/01/2012), Dementia (HCC), Depression, Diabetes mellitus, DOE (dyspnea on exertion) (06/24/2014), Dysphagia, unspecified(787.20) (07/31/2012), Fungal dermatitis (11/13/2007), GERD (gastroesophageal reflux disease), Gout, unspecified, Headache, History of anemia due to chronic kidney disease (03/26/2022), History of blood clots, History of deep venous  thrombosis (01/27/2012), History of pulmonary embolus (PE) (04/22/2014), Hyperkalemia (07/26/2017), Hyperlipidemia, Hypertension, Insomnia, Joint pain, Joint swelling, Morbid obesity (HCC), Multiple benign nevi (11/15/2016), Myocardial infarction (HCC) (2004), Non-STEMI (non-ST elevated myocardial infarction) (HCC) (02/15/2020), Numbness, Obstructive sleep apnea, Osteoarthritis, Osteoarthritis, Osteoarthrosis, unspecified whether generalized or localized, lower leg, Pain in the chest (12/31/2013), Pain, chronic, Personal history of other diseases of the digestive system (12/03/2006), Polymyalgia rheumatica (HCC), Presence of stent in right coronary artery, Pulmonary emboli (HCC) (01/2012), Situational depression (06/04/2009), Syncope, vasovagal (07/04/2021), Type 2 diabetes mellitus with hyperlipidemia (HCC) (02/16/2022), Urinary incontinence, Vaginitis and vulvovaginitis (06/23/2016), Vertigo, and Wears dentures..   She presents with chief complaint of Hospitalization Follow-up (ED follow up 10/05/2023 hyperglycemia. Pt c/o of fatigue and depression. //HM due- yearly mammogram, and shingles vaccine ) .   Discussed the use of AI scribe software for clinical note transcription with the patient, who gave verbal consent to proceed.  History of Present Illness [name] is an 89 year old female with temporal arteritis and type 2 diabetes mellitus who presents for hospital follow-up after a recent admission for a temporal arteritis flare and hyperglycemic hyperosmolar state.  She was admitted to [facility] from May 30 to October 07, 2023, for a flare of temporal arteritis and a hyperglycemic hyperosmolar state. During her admission, she was placed on a steroid taper. She currently experiences blurry vision in both eyes and is uncertain if the temporal arteritis has worsened her vision.  Her hospitalization was due to a hyperglycemic hyperosmolar state induced by steroid treatment for  temporal arteritis. Her A1c was 9.4% on Sep 25, 2023. During her hospital stay, she was treated with an insulin  infusion and transitioned to 22 units of Toujeo  twice daily and NovoLog  on a sliding scale. Her blood sugar levels remain high, often in the 200s and 300s, and she experiences excessive thirst and frequent urination. She is using a continuous glucose monitor to track her blood sugar levels.  She experiences a sensation of food and medication getting stuck in her throat, which has worsened over time. She uses Gatorade to help swallow her medications. No heartburn symptoms but reports a tight sensation in her chest occasionally.  She has COPD and uses Trelegy  once daily. She uses her nebulizer occasionally and experiences some shortness of breath, described as a tight band across her chest. No cough or significant change in her shortness of breath since discharge.  She experiences vertigo and headaches, described as a sensation of spinning. No recent ear symptoms or allergies.  She was previously on Ozempic  0.5 mg but discontinued it herself due to feeling weak and unwell. She is not currently taking Ozempic .       Past Surgical History:  Procedure Laterality Date   ABDOMINAL HYSTERECTOMY     partial   APPENDECTOMY     blood clots/legs and lungs  2013   BREAST BIOPSY Left 07/22/2014   BREAST BIOPSY Left 02/10/2013   BREAST LUMPECTOMY Left 11/05/2014   BREAST LUMPECTOMY WITH RADIOACTIVE SEED LOCALIZATION Left 11/05/2014   Procedure: LEFT BREAST LUMPECTOMY WITH RADIOACTIVE SEED LOCALIZATION;  Surgeon: Oza Blumenthal, MD;  Location: [facility] OR;  Service: General;  Laterality: Left;   CARDIAC CATHETERIZATION     COLONOSCOPY     CORONARY ANGIOPLASTY  2   CORONARY STENT INTERVENTION N/A 02/17/2020   Procedure: CORONARY STENT INTERVENTION;  Surgeon: Arleen Lacer, MD;  Location: [facility] INVASIVE CV LAB;  Service: Cardiovascular;  Laterality: N/A;   ESOPHAGOGASTRODUODENOSCOPY (EGD) WITH  PROPOFOL  N/A 11/07/2016   Procedure: ESOPHAGOGASTRODUODENOSCOPY (EGD) WITH PROPOFOL ;  Surgeon: Kenney Peacemaker, MD;  Location: [facility] ENDOSCOPY;  Service: Endoscopy;  Laterality: N/A;   ESOPHAGOGASTRODUODENOSCOPY (EGD) WITH PROPOFOL  N/A 01/05/2023   Procedure: ESOPHAGOGASTRODUODENOSCOPY (EGD) WITH PROPOFOL ;  Surgeon: Tobin Forts, MD;  Location: [facility] ENDOSCOPY;  Service: Gastroenterology;  Laterality: N/A;   EXCISION OF SKIN TAG Right 11/05/2014   Procedure: EXCISION OF RIGHT EYELID SKIN TAG;  Surgeon: Oza Blumenthal, MD;  Location: [facility] OR;  Service: General;  Laterality: Right;   EYE SURGERY Bilateral    cataract    GASTRIC BYPASS  1977    reversed in [facility], [facility]   LEFT HEART CATH AND CORONARY ANGIOGRAPHY N/A 02/17/2020   Procedure: LEFT HEART CATH AND CORONARY ANGIOGRAPHY;  Surgeon: Arleen Lacer, MD;  Location: [facility] INVASIVE CV LAB;  Service: Cardiovascular;  Laterality: N/A;   LEFT HEART CATHETERIZATION WITH CORONARY ANGIOGRAM N/A 06/29/2014   Procedure: LEFT HEART CATHETERIZATION WITH CORONARY ANGIOGRAM;  Surgeon: Millicent Ally, MD;  Location: [facility] CATH LAB;  Service: Cardiovascular;  Laterality: N/A;   MEMBRANE PEEL Right 10/23/2018   Procedure: MEMBRANE PEEL;  Surgeon: Shon Downing, MD;  Location: [facility] OR;  Service: Ophthalmology;  Laterality: Right;   MI with stent placement  2004   PARS PLANA VITRECTOMY Right 10/23/2018   Procedure: PARS PLANA VITRECTOMY WITH 25 GAUGE;  Surgeon: Shon Downing, MD;  Location: [facility] OR;  Service: Ophthalmology;  Laterality: Right;    Outpatient Medications Prior to Visit  Medication Sig Dispense Refill   albuterol  (PROAIR  HFA) 108 (90 Base) MCG/ACT inhaler Inhale 1-2 puffs into the lungs every 6 (six) hours as needed for wheezing or shortness of breath. 6.7 g 1   amitriptyline  (ELAVIL ) 75 MG tablet TAKE 1 TABLET BY MOUTH AT BEDTIME 90 tablet 0   atorvastatin  (LIPITOR ) 80 MG tablet TAKE 1 TABLET BY MOUTH EVERY DAY (Patient taking differently: Take 80 mg by  mouth at bedtime.) 90 tablet 3   butalbital -acetaminophen -caffeine  (FIORICET ) 50-325-40 MG tablet Take 1 tablet by mouth every 6 (six) hours as needed for headache. 20 tablet 0   calcitRIOL (ROCALTROL) 0.25 MCG capsule Take 0.25 mcg by mouth daily.     carvedilol  (  COREG ) 12.5 MG tablet TAKE 1 TABLET BY MOUTH 2 TIMES DAILY 180 tablet 1   cephALEXin  (KEFLEX ) 250 MG capsule Take 1 capsule (250 mg total) by mouth 2 (two) times daily for 7 days. 14 capsule 0   cetirizine  (ZYRTEC ) 10 MG tablet Take 10 mg by mouth daily as needed for allergies or rhinitis.     Continuous Glucose Receiver (FREESTYLE LIBRE 3 READER) DEVI Continuous glucose monitor 1 each 0   Continuous Glucose Sensor (FREESTYLE LIBRE 14 DAY SENSOR) MISC PLACE 1 DEVICE ON THE SKIN AS DIRECTED EVERY 14 DAYS 6 each 3   diclofenac  Sodium (VOLTAREN ) 1 % GEL Apply 2 g topically 4 (four) times daily. (Patient taking differently: Apply 2 g topically 4 (four) times daily as needed (pain).) 150 g 2   ELIQUIS  5 MG TABS tablet TAKE 1 TABLET BY MOUTH 2 TIMES DAILY 180 tablet 3   febuxostat  (ULORIC ) 40 MG tablet TAKE 1 TABLET BY MOUTH EVERY DAY 90 tablet 3   Fluticasone -Umeclidin-Vilant (TRELEGY ELLIPTA ) 100-62.5-25 MCG/ACT AEPB Inhale 1 puff into the lungs daily. 1 each 11   folic acid  (FOLVITE ) 1 MG tablet TAKE 1 TABLET BY MOUTH EVERY DAY 90 tablet 1   glucagon  1 MG injection Inject 1 mg into the vein once as needed for up to 1 dose. 1 each 12   glucose 4 GM chewable tablet Chew 1 tablet (4 g total) by mouth as needed for low blood sugar. 50 tablet 12   hydrOXYzine  (ATARAX ) 25 MG tablet TAKE 1 TABLET BY MOUTH EVERY 8 HOURS AS NEEDED FOR ANXIETY OR ITCHING (NASAL AND THROAT CONGESTION) 120 tablet 0   insulin  aspart (NOVOLOG  FLEXPEN) 100 UNIT/ML FlexPen Before each meal 3 times a day, 140-199 - 2 units, 200-250 - 6 units, 251-299 - 8 units,  300-349 - 12 units,  350 or above 14 units.     insulin  glargine, 2 Unit Dial , (TOUJEO  MAX SOLOSTAR) 300 UNIT/ML  Solostar Pen Inject 22 Units into the skin 2 (two) times daily.     meclizine  (ANTIVERT ) 25 MG tablet Take 1 tablet (25 mg total) by mouth 2 (two) times daily. 180 tablet 1   melatonin 5 MG TABS Take 1 tablet (5 mg total) by mouth at bedtime as needed. 15 tablet 0   memantine  (NAMENDA ) 5 MG tablet TAKE 2 TABLETS BY MOUTH EVERY MORNING and TAKE 1 TABLET BY MOUTH EVERY EVENING (Patient taking differently: Take 5-10 mg by mouth See admin instructions. Take 10 mg by mouth in the morning and 5 mg in the evening) 270 tablet 5   montelukast  (SINGULAIR ) 10 MG tablet Take 1 tablet (10 mg total) by mouth at bedtime. 30 tablet 3   nitroGLYCERIN  (NITROSTAT ) 0.4 MG SL tablet Dissolve 1 tablet under the tongue every 5 minutes as needed for chest pain. Max of 3 doses, then 911. 25 tablet 6   nystatin  (MYCOSTATIN /NYSTOP ) powder Apply 1 Application topically 3 (three) times daily. 60 g 3   ondansetron  (ZOFRAN ) 4 MG tablet Take 1 tablet (4 mg total) by mouth every 8 (eight) hours as needed for nausea or vomiting. 20 tablet 0   pantoprazole  (PROTONIX ) 20 MG tablet TAKE 1 TABLET BY MOUTH ONCE DAILY 30 tablet 5   predniSONE  (DELTASONE ) 10 MG tablet Take 4 tablets (40 mg total) by mouth daily with breakfast. 120 tablet 6   torsemide  (DEMADEX ) 20 MG tablet TAKE 1 TABLET BY MOUTH EVERY DAY MAY TAKE AN EXTRA DOES FOR WEIGHT GAIN OF 3LBs IN  1 DAY OR 5LBS IN 1 WEEK OR FOR WORSENING SWELLING 30 tablet 0   TYLENOL  500 MG tablet Take 1,000 mg by mouth every 6 (six) hours as needed for mild pain or headache.     TYLENOL  PM EXTRA STRENGTH 500-25 MG TABS tablet Take 2 tablets by mouth at bedtime.     Vitamin D , Ergocalciferol , (DRISDOL ) 1.25 MG (50000 UNIT) CAPS capsule TAKE 1 CAPSULE BY MOUTH ONCE WEEKLY ON MONDAY 4 capsule 1   azelastine  (ASTELIN ) 0.1 % nasal spray Place 2 sprays into both nostrils 2 (two) times daily. 30 mL 12   fluticasone  (FLONASE ) 50 MCG/ACT nasal spray Place 2 sprays into both nostrils daily. 16 g 6    Semaglutide ,0.25 or 0.5MG /DOS, (OZEMPIC , 0.25 OR 0.5 MG/DOSE,) 2 MG/3ML SOPN Inject 0.5 mg into the skin once a week. 9 mL 3   ipratropium-albuterol  (DUONEB) 0.5-2.5 (3) MG/3ML SOLN Take 3 mLs by nebulization every 6 (six) hours as needed (cough, wheezing, or shortness of breath). 360 mL 0   No facility-administered medications prior to visit.    Family History  Problem Relation Age of Onset   Breast cancer Mother 80   Heart disease Mother    Throat cancer Father    Hypertension Father    Arthritis Father    Diabetes Father    Arthritis Sister    Obesity Sister    Diabetes Sister    Heart disease Cousin    Colon cancer Neg Hx    Stomach cancer Neg Hx    Esophageal cancer Neg Hx     Social History   Socioeconomic History   Marital status: Widowed    Spouse name: Not on file   Number of children: 3   Years of education: Not on file   Highest education level: High school graduate  Occupational History   Occupation: retired  Tobacco Use   Smoking status: Never    Passive exposure: Past   Smokeless tobacco: Never   Tobacco comments:    Both parents smoked, patient was exposed/ "Raised up in smoke, my whole life."  Vaping Use   Vaping status: Never Used  Substance and Sexual Activity   Alcohol use: No    Alcohol/week: 0.0 standard drinks of alcohol   Drug use: No   Sexual activity: Not on file  Other Topics Concern   Not on file  Social History Narrative   Not on file   Social Drivers of Health   Financial Resource Strain: Low Risk  (09/28/2023)   Overall Financial Resource Strain (CARDIA)    Difficulty of Paying Living Expenses: Not hard at all  Food Insecurity: No Food Insecurity (10/05/2023)   Hunger Vital Sign    Worried About Running Out of Food in the Last Year: Never true    Ran Out of Food in the Last Year: Never true  Transportation Needs: No Transportation Needs (10/05/2023)   PRAPARE - Administrator, Civil Service (Medical): No    Lack of  Transportation (Non-Medical): No  Physical Activity: Inactive (09/28/2023)   Exercise Vital Sign    Days of Exercise per Week: 0 days    Minutes of Exercise per Session: 0 min  Stress: Stress Concern Present (09/28/2023)   [employer] of Occupational Health - Occupational Stress Questionnaire    Feeling of Stress : To some extent  Social Connections: Socially Isolated (10/05/2023)   Social Connection and Isolation Panel [NHANES]    Frequency of Communication with Friends and Family:  Three times a week    Frequency of Social Gatherings with Friends and Family: More than three times a week    Attends Religious Services: Never    Database administrator or Organizations: No    Attends Banker Meetings: Never    Marital Status: Widowed  Intimate Partner Violence: Not At Risk (10/05/2023)   Humiliation, Afraid, Rape, and Kick questionnaire    Fear of Current or Ex-Partner: No    Emotionally Abused: No    Physically Abused: No    Sexually Abused: No                                                                                                  Objective:  Physical Exam: BP 101/61 (BP Location: Left Arm, Patient Position: Sitting, Cuff Size: Large)   Pulse 72   Temp 97.7 F (36.5 C) (Temporal)   Resp 18   Wt 268 lb 15.4 oz (122 kg)   SpO2 97%   BMI 42.13 kg/m    Physical Exam GENERAL: Alert, cooperative, well developed, no acute distress. HEENT: Normocephalic, normal oropharynx, moist mucous membranes. CHEST: Clear to auscultation bilaterally, no wheezes, rhonchi, or crackles, slight increased work in breathing, improved after duonebs CARDIOVASCULAR: Normal heart rate and rhythm, S1 and S2 normal without murmurs. ABDOMEN: Soft, non-tender, non-distended, without organomegaly, normal bowel sounds. EXTREMITIES: No cyanosis or edema. NEUROLOGICAL: Cranial nerves grossly intact, moves all extremities without gross motor or sensory deficit.     CT Head Wo  Contrast Result Date: 09/13/2023 CLINICAL DATA:  Increasing headaches EXAM: CT HEAD WITHOUT CONTRAST TECHNIQUE: Contiguous axial images were obtained from the base of the skull through the vertex without intravenous contrast. RADIATION DOSE REDUCTION: This exam was performed according to the departmental dose-optimization program which includes automated exposure control, adjustment of the mA and/or kV according to patient size and/or use of iterative reconstruction technique. COMPARISON:  06/04/2023 FINDINGS: Brain: No evidence of acute infarction, hemorrhage, hydrocephalus, extra-axial collection or mass lesion/mass effect. Chronic atrophic and ischemic changes are noted. Vascular: No hyperdense vessel or unexpected calcification. Skull: Normal. Negative for fracture or focal lesion. Sinuses/Orbits: No acute finding. Other: None IMPRESSION: Chronic atrophic and ischemic changes.  No acute abnormality noted. Electronically Signed   By: Violeta Grey M.D.   On: 09/13/2023 01:29    Recent Results (from the past 2160 hours)  Iron  and Iron  Binding Capacity ([facility] only)     Status: Abnormal   Collection Time: 08/10/23  2:02 PM  Result Value Ref Range   Iron  48 28 - 170 ug/dL   TIBC 098 (L) 119 - 147 ug/dL   Saturation Ratios 23 10.4 - 31.8 %   UIBC 159 148 - 442 ug/dL    Comment: Performed at [facility], [address] [place], [place] [address]  Ferritin     Status: None   Collection Time: 08/10/23  2:02 PM  Result Value Ref Range   Ferritin 217 11 - 307 ng/mL    Comment: Performed at [facility], [address], [place], [place] [address]  CMP (Cancer Center only)     Status: Abnormal   Collection Time: 08/10/23  2:02 PM  Result Value Ref Range   Sodium 136 135 - 145 mmol/L   Potassium 5.1 3.5 - 5.1 mmol/L   Chloride 105 98 - 111 mmol/L   CO2 27 22 - 32 mmol/L   Glucose, Bld 120 (H) 70 - 99 mg/dL    Comment: Glucose reference range applies  only to samples taken after fasting for at least 8 hours.   BUN 63 (H) 8 - 23 mg/dL   Creatinine 1.61 (H) 0.96 - 1.00 mg/dL   Calcium  8.4 (L) 8.9 - 10.3 mg/dL   Total Protein 7.2 6.5 - 8.1 g/dL   Albumin  3.4 (L) 3.5 - 5.0 g/dL   AST 11 (L) 15 - 41 U/L   ALT 8 0 - 44 U/L   Alkaline Phosphatase 98 38 - 126 U/L   Total Bilirubin 0.3 0.0 - 1.2 mg/dL   GFR, Estimated 19 (L) >60 mL/min    Comment: (NOTE) Calculated using the CKD-EPI Creatinine Equation (2021)    Anion gap 4 (L) 5 - 15    Comment: Performed at [facility], [address] [place], [place] [address]  CBC with Differential (Cancer Center Only)     Status: Abnormal   Collection Time: 08/10/23  2:02 PM  Result Value Ref Range   WBC Count 8.5 4.0 - 10.5 K/uL   RBC 2.91 (L) 3.87 - 5.11 MIL/uL   Hemoglobin 8.6 (L) 12.0 - 15.0 g/dL   HCT 98.1 (L) 19.1 - 47.8 %   MCV 94.8 80.0 - 100.0 fL   MCH 29.6 26.0 - 34.0 pg   MCHC 31.2 30.0 - 36.0 g/dL   RDW 29.5 62.1 - 30.8 %   Platelet Count 244 150 - 400 K/uL   nRBC 0.0 0.0 - 0.2 %   Neutrophils Relative % 60 %   Neutro Abs 5.1 1.7 - 7.7 K/uL   Lymphocytes Relative 31 %   Lymphs Abs 2.6 0.7 - 4.0 K/uL   Monocytes Relative 6 %   Monocytes Absolute 0.5 0.1 - 1.0 K/uL   Eosinophils Relative 3 %   Eosinophils Absolute 0.2 0.0 - 0.5 K/uL   Basophils Relative 0 %   Basophils Absolute 0.0 0.0 - 0.1 K/uL   Immature Granulocytes 0 %   Abs Immature Granulocytes 0.02 0.00 - 0.07 K/uL    Comment: Performed at [facility], [address] [place], [place] [address]  Basic metabolic panel     Status: Abnormal   Collection Time: 09/05/23  3:10 PM  Result Value Ref Range   Sodium 136 135 - 145 mmol/L   Potassium 5.8 (H) 3.5 - 5.1 mmol/L   Chloride 105 98 - 111 mmol/L   CO2 22 22 - 32 mmol/L   Glucose, Bld 122 (H) 70 - 99 mg/dL    Comment: Glucose reference range applies only to samples taken after fasting for at least 8 hours.   BUN 64  (H) 8 - 23 mg/dL   Creatinine, Ser 6.96 (H) 0.44 - 1.00 mg/dL   Calcium  8.5 (L) 8.9 - 10.3 mg/dL   GFR, Estimated 18 (L) >60 mL/min    Comment: (NOTE) Calculated using the CKD-EPI Creatinine Equation (2021)    Anion gap 9 5 - 15    Comment: Performed at [facility], [address], [place], [place] [address]  CBC  Status: Abnormal   Collection Time: 09/05/23  3:10 PM  Result Value Ref Range   WBC 8.4 4.0 - 10.5 K/uL   RBC 3.22 (L) 3.87 - 5.11 MIL/uL   Hemoglobin 9.3 (L) 12.0 - 15.0 g/dL   HCT 13.2 (L) 44.0 - 10.2 %   MCV 96.3 80.0 - 100.0 fL   MCH 28.9 26.0 - 34.0 pg   MCHC 30.0 30.0 - 36.0 g/dL   RDW 72.5 36.6 - 44.0 %   Platelets 229 150 - 400 K/uL   nRBC 0.0 0.0 - 0.2 %    Comment: Performed at [facility], [address], [place], [place] [address]  CBG monitoring, ED     Status: Abnormal   Collection Time: 09/05/23  3:18 PM  Result Value Ref Range   Glucose-Capillary 120 (H) 70 - 99 mg/dL    Comment: Glucose reference range applies only to samples taken after fasting for at least 8 hours.  Iron  and Iron  Binding Capacity ([facility] only)     Status: Abnormal   Collection Time: 09/07/23  2:05 PM  Result Value Ref Range   Iron  63 28 - 170 ug/dL   TIBC 595 (L) 638 - 756 ug/dL   Saturation Ratios 29 10.4 - 31.8 %   UIBC 157 148 - 442 ug/dL    Comment: Performed at [facility], [address] [place], [place] [address]  Ferritin     Status: None   Collection Time: 09/07/23  2:05 PM  Result Value Ref Range   Ferritin 258 11 - 307 ng/mL    Comment: Performed at [facility], [address] [place], [place] [address]  CMP (Cancer Center only)     Status: Abnormal   Collection Time: 09/07/23  2:05 PM  Result Value Ref Range   Sodium 138 135 - 145 mmol/L   Potassium 5.4 (H) 3.5 - 5.1 mmol/L   Chloride 108 98 - 111 mmol/L   CO2 25 22 - 32 mmol/L   Glucose, Bld 138 (H) 70 - 99 mg/dL    Comment: Glucose  reference range applies only to samples taken after fasting for at least 8 hours.   BUN 66 (H) 8 - 23 mg/dL   Creatinine 1.66 (H) 0.63 - 1.00 mg/dL   Calcium  8.5 (L) 8.9 - 10.3 mg/dL   Total Protein 7.5 6.5 - 8.1 g/dL   Albumin  3.6 3.5 - 5.0 g/dL   AST 12 (L) 15 - 41 U/L   ALT 10 0 - 44 U/L   Alkaline Phosphatase 94 38 - 126 U/L   Total Bilirubin 0.3 0.0 - 1.2 mg/dL   GFR, Estimated 20 (L) >60 mL/min    Comment: (NOTE) Calculated using the CKD-EPI Creatinine Equation (2021)    Anion gap 5 5 - 15    Comment: Performed at [facility], [address], [place], [place] [address]  CBC with Differential (Cancer Center Only)     Status: Abnormal   Collection Time: 09/07/23  2:05 PM  Result Value Ref Range   WBC Count 8.6 4.0 - 10.5 K/uL   RBC 3.27 (L) 3.87 - 5.11 MIL/uL   Hemoglobin 9.3 (L) 12.0 - 15.0 g/dL   HCT 09.3 (L) 23.5 - 57.3 %   MCV 91.7 80.0 - 100.0 fL   MCH 28.4 26.0 - 34.0 pg   MCHC 31.0 30.0 - 36.0 g/dL   RDW 22.0 25.4 - 27.0 %   Platelet  Count 235 150 - 400 K/uL   nRBC 0.0 0.0 - 0.2 %   Neutrophils Relative % 56 %   Neutro Abs 4.8 1.7 - 7.7 K/uL   Lymphocytes Relative 32 %   Lymphs Abs 2.7 0.7 - 4.0 K/uL   Monocytes Relative 8 %   Monocytes Absolute 0.7 0.1 - 1.0 K/uL   Eosinophils Relative 3 %   Eosinophils Absolute 0.3 0.0 - 0.5 K/uL   Basophils Relative 1 %   Basophils Absolute 0.0 0.0 - 0.1 K/uL   Immature Granulocytes 0 %   Abs Immature Granulocytes 0.02 0.00 - 0.07 K/uL    Comment: Performed at [facility], [address], [place], [place] [address]  Basic metabolic panel     Status: Abnormal   Collection Time: 09/12/23 10:15 PM  Result Value Ref Range   Sodium 136 135 - 145 mmol/L   Potassium 4.4 3.5 - 5.1 mmol/L   Chloride 104 98 - 111 mmol/L   CO2 21 (L) 22 - 32 mmol/L   Glucose, Bld 170 (H) 70 - 99 mg/dL    Comment: Glucose reference range applies only to samples taken after fasting for at least 8  hours.   BUN 62 (H) 8 - 23 mg/dL   Creatinine, Ser 0.86 (H) 0.44 - 1.00 mg/dL   Calcium  8.4 (L) 8.9 - 10.3 mg/dL   GFR, Estimated 20 (L) >60 mL/min    Comment: (NOTE) Calculated using the CKD-EPI Creatinine Equation (2021)    Anion gap 11 5 - 15    Comment: Performed at [facility], [address] [place], [place] [address]  CBC with Differential/Platelet     Status: Abnormal   Collection Time: 09/12/23 10:15 PM  Result Value Ref Range   WBC 11.1 (H) 4.0 - 10.5 K/uL   RBC 3.23 (L) 3.87 - 5.11 MIL/uL   Hemoglobin 9.4 (L) 12.0 - 15.0 g/dL   HCT 96.2 (L) 95.2 - 84.1 %   MCV 97.2 80.0 - 100.0 fL   MCH 29.1 26.0 - 34.0 pg   MCHC 29.9 (L) 30.0 - 36.0 g/dL   RDW 32.4 40.1 - 02.7 %   Platelets 217 150 - 400 K/uL   nRBC 0.0 0.0 - 0.2 %   Neutrophils Relative % 56 %   Neutro Abs 6.2 1.7 - 7.7 K/uL   Lymphocytes Relative 33 %   Lymphs Abs 3.6 0.7 - 4.0 K/uL   Monocytes Relative 8 %   Monocytes Absolute 0.8 0.1 - 1.0 K/uL   Eosinophils Relative 3 %   Eosinophils Absolute 0.3 0.0 - 0.5 K/uL   Basophils Relative 0 %   Basophils Absolute 0.0 0.0 - 0.1 K/uL   Immature Granulocytes 0 %   Abs Immature Granulocytes 0.04 0.00 - 0.07 K/uL    Comment: Performed at [facility], [address], [place], [place] [address]  Resp panel by RT-PCR (RSV, Flu A&B, Covid) Anterior Nasal Swab     Status: None   Collection Time: 09/12/23 11:33 PM   Specimen: Anterior Nasal Swab  Result Value Ref Range   SARS Coronavirus 2 by RT PCR NEGATIVE NEGATIVE   Influenza A by PCR NEGATIVE NEGATIVE   Influenza B by PCR NEGATIVE NEGATIVE    Comment: (NOTE) The Xpert Xpress SARS-CoV-2/FLU/RSV plus assay is intended as an aid in the diagnosis of influenza from Nasopharyngeal swab specimens and should not be used as a sole basis for treatment. Nasal washings and  aspirates are unacceptable for Xpert Xpress SARS-CoV-2/FLU/RSV testing.  Fact Sheet for  Patients: BloggerCourse.com  Fact Sheet for Healthcare Providers: SeriousBroker.it  This test is not yet approved or cleared by the [place]  FDA and has been authorized for detection and/or diagnosis of SARS-CoV-2 by FDA under an Emergency Use Authorization (EUA). This EUA will remain in effect (meaning this test can be used) for the duration of the COVID-19 declaration under Section 564(b)(1) of the Act, 21 U.S.C. section 360bbb-3(b)(1), unless the authorization is terminated or revoked.     Resp Syncytial Virus by PCR NEGATIVE NEGATIVE    Comment: (NOTE) Fact Sheet for Patients: BloggerCourse.com  Fact Sheet for Healthcare Providers: SeriousBroker.it  This test is not yet approved or cleared by the [place]  FDA and has been authorized for detection and/or diagnosis of SARS-CoV-2 by FDA under an Emergency Use Authorization (EUA). This EUA will remain in effect (meaning this test can be used) for the duration of the COVID-19 declaration under Section 564(b)(1) of the Act, 21 U.S.C. section 360bbb-3(b)(1), unless the authorization is terminated or revoked.  Performed at [facility], [address], [place], [place] [address]   Sedimentation rate     Status: Abnormal   Collection Time: 09/12/23 11:59 PM  Result Value Ref Range   Sed Rate 87 (H) 0 - 22 mm/hr    Comment: Performed at [facility], [address] [place], [place] [address]  C-reactive protein     Status: Abnormal   Collection Time: 09/12/23 11:59 PM  Result Value Ref Range   CRP 1.4 (H) <1.0 mg/dL    Comment: Performed at [facility], [address], [place], [place] [address]  Urinalysis, w/ Reflex to Culture (Infection Suspected) -Urine, Clean Catch     Status: Abnormal   Collection Time: 09/13/23  3:00 AM  Result Value Ref Range   Specimen Source URINE, CLEAN CATCH    Color,  Urine YELLOW YELLOW   APPearance HAZY (A) CLEAR   Specific Gravity, Urine 1.008 1.005 - 1.030   pH 5.0 5.0 - 8.0   Glucose, UA NEGATIVE NEGATIVE mg/dL   Hgb urine dipstick NEGATIVE NEGATIVE   Bilirubin Urine NEGATIVE NEGATIVE   Ketones, ur NEGATIVE NEGATIVE mg/dL   Protein, ur NEGATIVE NEGATIVE mg/dL   Nitrite NEGATIVE NEGATIVE   Leukocytes,Ua SMALL (A) NEGATIVE   RBC / HPF 0-5 0 - 5 RBC/hpf   WBC, UA 6-10 0 - 5 WBC/hpf    Comment:        Reflex urine culture not performed if WBC <=10, OR if Squamous epithelial cells >5. If Squamous epithelial cells >5 suggest recollection.    Bacteria, UA FEW (A) NONE SEEN   Squamous Epithelial / HPF 6-10 0 - 5 /HPF    Comment: Performed at [facility], [address] [place], [place] [address]  CK     Status: None   Collection Time: 09/25/23  3:28 PM  Result Value Ref Range   Total CK 63 26 - 161 U/L  Hgb A1c w/o eAG     Status: Abnormal   Collection Time: 09/25/23  3:28 PM  Result Value Ref Range   Hgb A1c MFr Bld 9.4 (H) 4.8 - 5.6 %    Comment:          Prediabetes: 5.7 - 6.4          Diabetes: >6.4          Glycemic control for  adults with diabetes: <7.0   C-reactive protein     Status: None   Collection Time: 09/25/23  3:28 PM  Result Value Ref Range   CRP 6 0 - 10 mg/L  Sedimentation rate     Status: None   Collection Time: 09/25/23  3:28 PM  Result Value Ref Range   Sed Rate 34 0 - 40 mm/hr  Acetylcholine receptor, binding     Status: None   Collection Time: 09/25/23  3:28 PM  Result Value Ref Range   AChR Binding Ab, Serum <0.07 0.00 - 0.24 nmol/L    Comment:                                Negative:   0.00 - 0.24                                Borderline: 0.25 - 0.40                                Positive:         >0.40   POCT Urinalysis Dipstick (Automated)     Status: Abnormal   Collection Time: 10/04/23 11:17 AM  Result Value Ref Range   Color, UA yellow    Clarity, UA cloudy    Glucose, UA Positive (A)  Negative    Comment: 3+   Bilirubin, UA neg    Ketones, UA neg    Spec Grav, UA 1.010 1.010 - 1.025   Blood, UA positive     Comment: 1+   pH, UA 6.0 5.0 - 8.0   Protein, UA Positive (A) Negative    Comment: trace   Urobilinogen, UA 0.2 0.2 or 1.0 E.U./dL   Nitrite, UA neg    Leukocytes, UA Small (1+) (A) Negative  Urine Culture     Status: Abnormal   Collection Time: 10/04/23 12:31 PM   Specimen: Urine  Result Value Ref Range   MICRO NUMBER: [id number]    SPECIMEN QUALITY: Adequate    Sample Source URINE    STATUS: FINAL    ISOLATE 1: Klebsiella pneumoniae (A)     Comment: Greater than 100,000 CFU/mL of Klebsiella pneumoniae      Susceptibility   Klebsiella pneumoniae - URINE CULTURE, REFLEX    AMOX/CLAVULANIC <=2 Sensitive     AMPICILLIN/SULBACTAM 4 Sensitive     CEFAZOLIN * <=4 Not Reportable      * For infections other than uncomplicated UTI caused by E. coli, K. pneumoniae or P. mirabilis: Cefazolin  is resistant if MIC > or = 8 mcg/mL. (Distinguishing susceptible versus intermediate for isolates with MIC < or = 4 mcg/mL requires additional testing.) For uncomplicated UTI caused by E. coli, K. pneumoniae or P. mirabilis: Cefazolin  is susceptible if MIC <32 mcg/mL and predicts susceptible to the oral agents cefaclor, cefdinir , cefpodoxime, cefprozil, cefuroxime , cephalexin  and loracarbef.     CEFTAZIDIME <=1 Sensitive     CEFEPIME <=0.12 Sensitive     CEFTRIAXONE  <=0.25 Sensitive     CIPROFLOXACIN  0.5 Intermediate     LEVOFLOXACIN  0.5 Sensitive     GENTAMICIN  <=1 Sensitive     IMIPENEM <=0.25 Sensitive     MEROPENEM <=0.25 Sensitive     NITROFURANTOIN  <=16 Sensitive     PIP/TAZO <=4 Sensitive     TRIMETH/SULFA* <=20  Sensitive      * For infections other than uncomplicated UTI caused by E. coli, K. pneumoniae or P. mirabilis: Cefazolin  is resistant if MIC > or = 8 mcg/mL. (Distinguishing susceptible versus intermediate for isolates with MIC < or = 4 mcg/mL  requires additional testing.) For uncomplicated UTI caused by E. coli, K. pneumoniae or P. mirabilis: Cefazolin  is susceptible if MIC <32 mcg/mL and predicts susceptible to the oral agents cefaclor, cefdinir , cefpodoxime, cefprozil, cefuroxime , cephalexin  and loracarbef. Legend: S = Susceptible  I = Intermediate R = Resistant  NS = Not susceptible SDD = Susceptible Dose Dependent * = Not Tested  NR = Not Reported **NN = See Therapy Comments   CMP (Cancer Center only)     Status: Abnormal   Collection Time: 10/05/23  9:01 AM  Result Value Ref Range   Sodium 129 (L) 135 - 145 mmol/L   Potassium 4.9 3.5 - 5.1 mmol/L   Chloride 90 (L) 98 - 111 mmol/L   CO2 30 22 - 32 mmol/L   Glucose, Bld 717 (HH) 70 - 99 mg/dL    Comment: Glucose reference range applies only to samples taken after fasting for at least 8 hours. REPEATED TO VERIFY    BUN 80 (H) 8 - 23 mg/dL   Creatinine 1.61 (H) 0.96 - 1.00 mg/dL   Calcium  8.6 (L) 8.9 - 10.3 mg/dL   Total Protein 6.5 6.5 - 8.1 g/dL   Albumin  3.4 (L) 3.5 - 5.0 g/dL   AST 9 (L) 15 - 41 U/L   ALT 16 0 - 44 U/L   Alkaline Phosphatase 76 38 - 126 U/L   Total Bilirubin 0.6 0.0 - 1.2 mg/dL   GFR, Estimated 17 (L) >60 mL/min    Comment: (NOTE) Calculated using the CKD-EPI Creatinine Equation (2021)    Anion gap 9 5 - 15    Comment: Performed at [facility], [address], [place], [place] [address]  CBC with Differential (Cancer Center Only)     Status: Abnormal   Collection Time: 10/05/23  9:01 AM  Result Value Ref Range   WBC Count 12.5 (H) 4.0 - 10.5 K/uL   RBC 3.11 (L) 3.87 - 5.11 MIL/uL   Hemoglobin 9.0 (L) 12.0 - 15.0 g/dL   HCT 98.1 (L) 19.1 - 47.8 %   MCV 89.4 80.0 - 100.0 fL   MCH 28.9 26.0 - 34.0 pg   MCHC 32.4 30.0 - 36.0 g/dL   RDW 29.5 62.1 - 30.8 %   Platelet Count 168 150 - 400 K/uL   nRBC 0.0 0.0 - 0.2 %   Neutrophils Relative % 94 %   Neutro Abs 11.6 (H) 1.7 - 7.7 K/uL   Lymphocytes Relative 4 %    Lymphs Abs 0.5 (L) 0.7 - 4.0 K/uL   Monocytes Relative 2 %   Monocytes Absolute 0.3 0.1 - 1.0 K/uL   Eosinophils Relative 0 %   Eosinophils Absolute 0.0 0.0 - 0.5 K/uL   Basophils Relative 0 %   Basophils Absolute 0.0 0.0 - 0.1 K/uL   Immature Granulocytes 0 %   Abs Immature Granulocytes 0.05 0.00 - 0.07 K/uL    Comment: Performed at [facility], [address], [place], [place] [address]  CBG monitoring, ED     Status: Abnormal   Collection Time: 10/05/23 11:03 AM  Result Value Ref Range   Glucose-Capillary >600 (HH) 70 - 99 mg/dL    Comment: Glucose reference range  applies only to samples taken after fasting for at least 8 hours.  Comprehensive metabolic panel     Status: Abnormal   Collection Time: 10/05/23 11:32 AM  Result Value Ref Range   Sodium 128 (L) 135 - 145 mmol/L   Potassium 5.3 (H) 3.5 - 5.1 mmol/L   Chloride 90 (L) 98 - 111 mmol/L   CO2 27 22 - 32 mmol/L   Glucose, Bld 766 (HH) 70 - 99 mg/dL    Comment: CRITICAL RESULT CALLED TO, READ BACK BY AND VERIFIED WITH P,BLANCHARD RN @1324  10/05/23 E,[place] Glucose reference range applies only to samples taken after fasting for at least 8 hours.    BUN 76 (H) 8 - 23 mg/dL   Creatinine, Ser 1.61 (H) 0.44 - 1.00 mg/dL   Calcium  8.5 (L) 8.9 - 10.3 mg/dL   Total Protein 6.4 (L) 6.5 - 8.1 g/dL   Albumin  2.9 (L) 3.5 - 5.0 g/dL   AST 13 (L) 15 - 41 U/L   ALT 18 0 - 44 U/L   Alkaline Phosphatase 59 38 - 126 U/L   Total Bilirubin 0.6 0.0 - 1.2 mg/dL   GFR, Estimated 17 (L) >60 mL/min    Comment: (NOTE) Calculated using the CKD-EPI Creatinine Equation (2021)    Anion gap 11 5 - 15    Comment: Performed at [facility], [address], [place], [place] [address]  CBC with Differential     Status: Abnormal   Collection Time: 10/05/23 11:32 AM  Result Value Ref Range   WBC 14.7 (H) 4.0 - 10.5 K/uL   RBC 2.98 (L) 3.87 - 5.11 MIL/uL   Hemoglobin 8.9 (L) 12.0 - 15.0 g/dL   HCT 54.0 (L) 98.1 -  46.0 %   MCV 90.6 80.0 - 100.0 fL   MCH 29.9 26.0 - 34.0 pg   MCHC 33.0 30.0 - 36.0 g/dL   RDW 19.1 47.8 - 29.5 %   Platelets 160 150 - 400 K/uL   nRBC 0.0 0.0 - 0.2 %   Neutrophils Relative % 93 %   Neutro Abs 13.6 (H) 1.7 - 7.7 K/uL   Lymphocytes Relative 4 %   Lymphs Abs 0.6 (L) 0.7 - 4.0 K/uL   Monocytes Relative 3 %   Monocytes Absolute 0.4 0.1 - 1.0 K/uL   Eosinophils Relative 0 %   Eosinophils Absolute 0.0 0.0 - 0.5 K/uL   Basophils Relative 0 %   Basophils Absolute 0.0 0.0 - 0.1 K/uL   Immature Granulocytes 0 %   Abs Immature Granulocytes 0.06 0.00 - 0.07 K/uL    Comment: Performed at [facility], [address], [place], [place] [address]  Beta-hydroxybutyric acid     Status: None   Collection Time: 10/05/23 11:50 AM  Result Value Ref Range   Beta-Hydroxybutyric Acid 0.25 0.05 - 0.27 mmol/L    Comment: Performed at [facility], [address], [place], [place] [address]  I-Stat venous blood gas, ED     Status: Abnormal   Collection Time: 10/05/23 11:56 AM  Result Value Ref Range   pH, Ven 7.408 7.25 - 7.43   pCO2, Ven 48.4 44 - 60 mmHg   pO2, Ven 28 (LL) 32 - 45 mmHg   Bicarbonate 30.5 (H) 20.0 - 28.0 mmol/L   TCO2 32 22 - 32 mmol/L   O2 Saturation 52 %   Acid-Base Excess 5.0 (H) 0.0 - 2.0 mmol/L   Sodium 127 (L) 135 - 145 mmol/L  Potassium 5.3 (H) 3.5 - 5.1 mmol/L   Calcium , Ion 1.12 (L) 1.15 - 1.40 mmol/L   HCT 29.0 (L) 36.0 - 46.0 %   Hemoglobin 9.9 (L) 12.0 - 15.0 g/dL   Sample type VENOUS    Comment NOTIFIED PHYSICIAN   CBG monitoring, ED     Status: Abnormal   Collection Time: 10/05/23  1:47 PM  Result Value Ref Range   Glucose-Capillary >600 (HH) 70 - 99 mg/dL    Comment: Glucose reference range applies only to samples taken after fasting for at least 8 hours.  CBG monitoring, ED     Status: Abnormal   Collection Time: 10/05/23  2:33 PM  Result Value Ref Range   Glucose-Capillary >600 (HH) 70 - 99 mg/dL    Comment: Glucose reference  range applies only to samples taken after fasting for at least 8 hours.  CBG monitoring, ED     Status: Abnormal   Collection Time: 10/05/23  3:06 PM  Result Value Ref Range   Glucose-Capillary 589 (HH) 70 - 99 mg/dL    Comment: Glucose reference range applies only to samples taken after fasting for at least 8 hours.   Comment 1 Document in Chart   CBG monitoring, ED     Status: Abnormal   Collection Time: 10/05/23  4:43 PM  Result Value Ref Range   Glucose-Capillary 457 (H) 70 - 99 mg/dL    Comment: Glucose reference range applies only to samples taken after fasting for at least 8 hours.  CBG monitoring, ED     Status: Abnormal   Collection Time: 10/05/23  5:21 PM  Result Value Ref Range   Glucose-Capillary 376 (H) 70 - 99 mg/dL    Comment: Glucose reference range applies only to samples taken after fasting for at least 8 hours.  CBG monitoring, ED     Status: Abnormal   Collection Time: 10/05/23  6:24 PM  Result Value Ref Range   Glucose-Capillary 251 (H) 70 - 99 mg/dL    Comment: Glucose reference range applies only to samples taken after fasting for at least 8 hours.  CBG monitoring, ED     Status: Abnormal   Collection Time: 10/05/23  7:27 PM  Result Value Ref Range   Glucose-Capillary 175 (H) 70 - 99 mg/dL    Comment: Glucose reference range applies only to samples taken after fasting for at least 8 hours.  CBG monitoring, ED     Status: Abnormal   Collection Time: 10/05/23  8:31 PM  Result Value Ref Range   Glucose-Capillary 168 (H) 70 - 99 mg/dL    Comment: Glucose reference range applies only to samples taken after fasting for at least 8 hours.  CBG monitoring, ED     Status: Abnormal   Collection Time: 10/05/23  9:41 PM  Result Value Ref Range   Glucose-Capillary 143 (H) 70 - 99 mg/dL    Comment: Glucose reference range applies only to samples taken after fasting for at least 8 hours.  CBG monitoring, ED     Status: Abnormal   Collection Time: 10/05/23 10:32 PM   Result Value Ref Range   Glucose-Capillary 152 (H) 70 - 99 mg/dL    Comment: Glucose reference range applies only to samples taken after fasting for at least 8 hours.  Osmolality     Status: Abnormal   Collection Time: 10/05/23 10:48 PM  Result Value Ref Range   Osmolality 304 (H) 275 - 295 mOsm/kg    Comment: Performed  at [facility], [address], [place], [place] [address]  CBC     Status: Abnormal   Collection Time: 10/05/23 10:48 PM  Result Value Ref Range   WBC 18.7 (H) 4.0 - 10.5 K/uL   RBC 3.03 (L) 3.87 - 5.11 MIL/uL   Hemoglobin 8.8 (L) 12.0 - 15.0 g/dL   HCT 95.2 (L) 84.1 - 32.4 %   MCV 89.8 80.0 - 100.0 fL   MCH 29.0 26.0 - 34.0 pg   MCHC 32.4 30.0 - 36.0 g/dL   RDW 40.1 02.7 - 25.3 %   Platelets 166 150 - 400 K/uL   nRBC 0.0 0.0 - 0.2 %    Comment: Performed at [facility], [address], [place], [place] [address]  Glucose, capillary     Status: Abnormal   Collection Time: 10/06/23 12:09 AM  Result Value Ref Range   Glucose-Capillary 191 (H) 70 - 99 mg/dL    Comment: Glucose reference range applies only to samples taken after fasting for at least 8 hours.  Basic metabolic panel     Status: Abnormal   Collection Time: 10/06/23  2:24 AM  Result Value Ref Range   Sodium 135 135 - 145 mmol/L    Comment: DELTA CHECK NOTED   Potassium 4.3 3.5 - 5.1 mmol/L   Chloride 97 (L) 98 - 111 mmol/L   CO2 25 22 - 32 mmol/L   Glucose, Bld 256 (H) 70 - 99 mg/dL    Comment: Glucose reference range applies only to samples taken after fasting for at least 8 hours.   BUN 70 (H) 8 - 23 mg/dL   Creatinine, Ser 3.47 (H) 0.44 - 1.00 mg/dL   Calcium  8.3 (L) 8.9 - 10.3 mg/dL   GFR, Estimated 19 (L) >60 mL/min    Comment: (NOTE) Calculated using the CKD-EPI Creatinine Equation (2021)    Anion gap 13 5 - 15    Comment: Performed at [facility], [address], [place], [place] [address]  Glucose, capillary     Status: Abnormal   Collection Time: 10/06/23  8:19  AM  Result Value Ref Range   Glucose-Capillary 280 (H) 70 - 99 mg/dL    Comment: Glucose reference range applies only to samples taken after fasting for at least 8 hours.  Glucose, capillary     Status: Abnormal   Collection Time: 10/06/23 11:58 AM  Result Value Ref Range   Glucose-Capillary 306 (H) 70 - 99 mg/dL    Comment: Glucose reference range applies only to samples taken after fasting for at least 8 hours.  Glucose, capillary     Status: Abnormal   Collection Time: 10/06/23  4:27 PM  Result Value Ref Range   Glucose-Capillary 402 (H) 70 - 99 mg/dL    Comment: Glucose reference range applies only to samples taken after fasting for at least 8 hours.  Glucose, capillary     Status: Abnormal   Collection Time: 10/06/23  9:27 PM  Result Value Ref Range   Glucose-Capillary 398 (H) 70 - 99 mg/dL    Comment: Glucose reference range applies only to samples taken after fasting for at least 8 hours.  Glucose, capillary     Status: Abnormal   Collection Time: 10/06/23 11:18 PM  Result Value Ref Range   Glucose-Capillary 417 (H) 70 - 99 mg/dL    Comment: Glucose reference range applies only to samples taken after fasting for at least 8 hours.  Glucose, capillary  Status: Abnormal   Collection Time: 10/07/23  1:48 AM  Result Value Ref Range   Glucose-Capillary 316 (H) 70 - 99 mg/dL    Comment: Glucose reference range applies only to samples taken after fasting for at least 8 hours.  Basic metabolic panel     Status: Abnormal   Collection Time: 10/07/23  2:24 AM  Result Value Ref Range   Sodium 133 (L) 135 - 145 mmol/L   Potassium 4.4 3.5 - 5.1 mmol/L   Chloride 96 (L) 98 - 111 mmol/L   CO2 26 22 - 32 mmol/L   Glucose, Bld 308 (H) 70 - 99 mg/dL    Comment: Glucose reference range applies only to samples taken after fasting for at least 8 hours.   BUN 78 (H) 8 - 23 mg/dL   Creatinine, Ser 1.47 (H) 0.44 - 1.00 mg/dL   Calcium  8.2 (L) 8.9 - 10.3 mg/dL   GFR, Estimated 16 (L) >60  mL/min    Comment: (NOTE) Calculated using the CKD-EPI Creatinine Equation (2021)    Anion gap 11 5 - 15    Comment: Performed at [facility], [address] [place], [place] [address]  Glucose, capillary     Status: Abnormal   Collection Time: 10/07/23  4:51 AM  Result Value Ref Range   Glucose-Capillary 215 (H) 70 - 99 mg/dL    Comment: Glucose reference range applies only to samples taken after fasting for at least 8 hours.  Glucose, capillary     Status: Abnormal   Collection Time: 10/07/23  8:22 AM  Result Value Ref Range   Glucose-Capillary 150 (H) 70 - 99 mg/dL    Comment: Glucose reference range applies only to samples taken after fasting for at least 8 hours.        Carnell Christian, MD, MS

## 2023-10-09 NOTE — Progress Notes (Signed)
 [facility] Liaison Note  10/09/2023  [name] May 27, 1934 [id number]  Location: RN Hospital Liaison screened the patient remotely at [facility].  Insurance: [employer]   [name] is a 89 y.o. female who is a Primary Care Patient of Catheryn Cluck, MD  [facility]  Healthcare at [facility]. The patient was screened for 30 day readmission hospitalization with noted extreme risk score for unplanned readmission risk with 2 IP/2 ED in 6 months.  Patient is currently active with Care Management for chronic disease management services.  Patient has been engaged by a  Charity fundraiser.  Our community based plan of care has focused on disease management and community resource support.   Patient will receive a post hospital call and will be evaluated for assessments and disease process education. Liaison will collaborate with the involved VBCI team concerning pt's discharge disposition.   VBCI Care Management/Population Health does not replace or interfere with any arrangements made by the Inpatient Transition of Care team.   For questions contact:   Lilla Reichert, RN, [facility] Liaison    [facility], Population Health Office Hours MTWF  8:00 am-6:00 pm Direct Dial : [phone] mobile [email or web address]

## 2023-10-09 NOTE — Patient Instructions (Addendum)
 VISIT SUMMARY: Today, we reviewed your recent hospital stay and addressed your ongoing health concerns. You were hospitalized for a flare of temporal arteritis and a hyperglycemic hyperosmolar state. We discussed your current medications and symptoms, including your vision, blood sugar levels, breathing, and swallowing difficulties.  YOUR PLAN: -TEMPORAL ARTERITIS: Temporal arteritis is an inflammation of the blood vessels in your temples, which can cause headaches and vision problems. You are currently on a steroid taper to reduce inflammation and prevent complications. Continue taking your steroids as prescribed, monitor for any changes in your vision or headaches, and be aware that steroids can cause mood changes.  -DIABETES MELLITUS TYPE 2 WITH HYPERGLYCEMIC HYPEROSMOLAR STATE: This condition involves very high blood sugar levels without the presence of ketones, often triggered by illness or medication. Your blood sugar levels are still high, and you will start taking Rybelsus to help manage them. Continue with your current insulin  regimen and monitor your blood sugars closely. Follow up with endocrinology for further diabetes management, and we will recheck your labs soon.  -CHRONIC OBSTRUCTIVE PULMONARY DISEASE (COPD): COPD is a chronic lung condition that makes it hard to breathe. You are using Trelegy daily and have a nebulizer at home. We gave you a breathing treatment in the office today and refilled your nebulizer medication and Flonase . Continue using these medications and monitor your breathing.  -DYSPHAGIA: Dysphagia is difficulty swallowing. You have been experiencing a sensation of food and medication getting stuck in your throat. We will monitor your symptoms, and if they persist, further evaluation may be needed.  INSTRUCTIONS: Please follow up with endocrinology for diabetes management. Continue monitoring your blood sugar levels closely and report any significant changes. Recheck  CMP, BMP, and CBC as discussed. If your swallowing difficulties persist, further evaluation may be required.

## 2023-10-09 NOTE — Telephone Encounter (Signed)
 Pharmacy Patient Advocate Encounter   Received notification from CoverMyMeds that prior authorization for Rybelsus 7MG  tablets is required/requested.   Insurance verification completed.   The patient is insured through [facility] ADVANTAGE/RX ADVANCE .   Per test claim: PA required; PA submitted to above mentioned insurance via CoverMyMeds Key/confirmation #/EOC [id number] Status is pending

## 2023-10-09 NOTE — Progress Notes (Signed)
 Complex Care Management Care Guide Note  10/09/2023 Name: [name] MRN: [id number] DOB: 02-23-1935  [name] is a 89 y.o. year old female who is a primary care patient of Catheryn Cluck, MD and is actively engaged with the care management team. I reached out to [name] by phone today to assist with re-scheduling  with the RN Case Manager.  Follow up plan: Unsuccessful telephone outreach attempt made. A HIPAA compliant phone message was left for the patient providing contact information and requesting a return call.  [facility]  [facility], [facility] Care Management Assistant Direct Dial : [phone]  Fax: [phone]

## 2023-10-09 NOTE — Progress Notes (Signed)
 Complex Care Management Care Guide Note  10/09/2023 Name: [name] MRN: [id number] DOB: 23-Feb-1935  [name] is a 89 y.o. year old female who is a primary care patient of Catheryn Cluck, MD and is actively engaged with the care management team. I reached out to [name] by phone today to assist with re-scheduling  with the RN Case Manager.  Follow up plan: Telephone appointment with complex care management team member scheduled for:  10/15/23 at 11:30.  [facility]  [facility], [facility] Care Management Assistant Direct Dial : [phone]  Fax: [phone]

## 2023-10-09 NOTE — Progress Notes (Deleted)
 Complex Care Management Note Care Guide Note  10/09/2023 Name: [name] MRN: [id number] DOB: 1934-11-21   Complex Care Management Outreach Attempts: A second unsuccessful outreach was attempted today to offer the patient with information about available complex care management services.  Follow Up Plan:  No further outreach attempts will be made at this time. We have been unable to contact the patient to offer or enroll patient in complex care management services.  Encounter Outcome:  No Answer  [facility]  [facility], [facility] Management Assistant Direct Dial : [phone]  Fax: [phone]

## 2023-10-10 ENCOUNTER — Telehealth: Payer: Self-pay

## 2023-10-10 NOTE — Telephone Encounter (Signed)
 Forwarding message below

## 2023-10-10 NOTE — Telephone Encounter (Signed)
 Copied from CRM [id number]. Topic: General - Other >> Oct 10, 2023 11:09 AM Chasity T wrote: Reason for CRM: [name] from [employer] medicare insurance is calling in for patient due to recent hospital visit and states that she has recently spoke with patient and their daughter regarding consodial care for the patient. They prefer the agency [facility]. If you could call [name] back at [phone]

## 2023-10-11 ENCOUNTER — Telehealth: Payer: Self-pay

## 2023-10-11 ENCOUNTER — Other Ambulatory Visit (HOSPITAL_COMMUNITY): Payer: Self-pay

## 2023-10-11 ENCOUNTER — Other Ambulatory Visit: Payer: Self-pay | Admitting: Family Medicine

## 2023-10-11 DIAGNOSIS — R413 Other amnesia: Secondary | ICD-10-CM

## 2023-10-11 NOTE — Telephone Encounter (Signed)
 Pharmacy Patient Advocate Encounter  Received notification from [facility] ADVANTAGE/RX ADVANCE that Prior Authorization for Rybelsus 7MG  tablets has been APPROVED from 10/09/2023 to 10/08/2024. Ran test claim, Copay is $0. This test claim was processed through [facility]- copay amounts may vary at other pharmacies due to pharmacy/plan contracts, or as the patient moves through the different stages of their insurance plan.

## 2023-10-11 NOTE — Telephone Encounter (Signed)
 Pharmacy Patient Advocate Encounter  Received notification from [facility] ADVANTAGE/RX ADVANCE that Prior Authorization for Rybelsus 7MG  tablets has been APPROVED from 10/09/2023 to 10/08/2024. Unable to obtain price due to refill too soon rejection, last fill date 10/11/2023 next available fill date06/28/2025.   PA #/Case ID/Reference #: [id number]

## 2023-10-11 NOTE — Progress Notes (Signed)
 Complex Care Management Note  Care Guide Note 10/11/2023 Name: SHANIRA TINE MRN: [id number] DOB: May 23, 1934  [name] is a 89 y.o. year old female who sees Catheryn Cluck, MD for primary care. I reached out to [name] by phone today to offer complex care management services.  Ms. Dolecki was given information about Complex Care Management services today including:   The Complex Care Management services include support from the care team which includes your Nurse Care Manager, Clinical Social Worker, or Pharmacist.  The Complex Care Management team is here to help remove barriers to the health concerns and goals most important to you. Complex Care Management services are voluntary, and the patient may decline or stop services at any time by request to their care team member.   Complex Care Management Consent Status: Patient agreed to services and verbal consent obtained.   Follow up plan:  Telephone appointment with complex care management team member scheduled for:  10/15/23 at 1: 00 p.m.   Encounter Outcome:  Patient Scheduled  [facility]  [facility], [facility] Management Assistant Direct Dial : [phone]  Fax: [phone]

## 2023-10-12 ENCOUNTER — Other Ambulatory Visit: Payer: Self-pay

## 2023-10-12 ENCOUNTER — Emergency Department (HOSPITAL_COMMUNITY)

## 2023-10-12 ENCOUNTER — Emergency Department (HOSPITAL_COMMUNITY)
Admission: EM | Admit: 2023-10-12 | Discharge: 2023-10-13 | Disposition: A | Discharge status: Home / Self Care | Attending: Emergency Medicine | Admitting: Emergency Medicine

## 2023-10-12 ENCOUNTER — Encounter (HOSPITAL_COMMUNITY): Payer: Self-pay | Admitting: Internal Medicine

## 2023-10-12 ENCOUNTER — Encounter (HOSPITAL_COMMUNITY): Payer: Self-pay

## 2023-10-12 DIAGNOSIS — Z86718 Personal history of other venous thrombosis and embolism: Secondary | ICD-10-CM

## 2023-10-12 DIAGNOSIS — I509 Heart failure, unspecified: Secondary | ICD-10-CM | POA: Insufficient documentation

## 2023-10-12 DIAGNOSIS — E66813 Obesity, class 3: Secondary | ICD-10-CM | POA: Diagnosis present

## 2023-10-12 DIAGNOSIS — D509 Iron deficiency anemia, unspecified: Secondary | ICD-10-CM | POA: Diagnosis present

## 2023-10-12 DIAGNOSIS — Z7901 Long term (current) use of anticoagulants: Secondary | ICD-10-CM | POA: Insufficient documentation

## 2023-10-12 DIAGNOSIS — I5032 Chronic diastolic (congestive) heart failure: Secondary | ICD-10-CM | POA: Diagnosis present

## 2023-10-12 DIAGNOSIS — J4489 Other specified chronic obstructive pulmonary disease: Secondary | ICD-10-CM | POA: Diagnosis not present

## 2023-10-12 DIAGNOSIS — F411 Generalized anxiety disorder: Secondary | ICD-10-CM | POA: Diagnosis not present

## 2023-10-12 DIAGNOSIS — Z794 Long term (current) use of insulin: Secondary | ICD-10-CM

## 2023-10-12 DIAGNOSIS — N189 Chronic kidney disease, unspecified: Secondary | ICD-10-CM | POA: Insufficient documentation

## 2023-10-12 DIAGNOSIS — I130 Hypertensive heart and chronic kidney disease with heart failure and stage 1 through stage 4 chronic kidney disease, or unspecified chronic kidney disease: Secondary | ICD-10-CM | POA: Insufficient documentation

## 2023-10-12 DIAGNOSIS — I10 Essential (primary) hypertension: Secondary | ICD-10-CM | POA: Diagnosis not present

## 2023-10-12 DIAGNOSIS — R079 Chest pain, unspecified: Secondary | ICD-10-CM | POA: Diagnosis not present

## 2023-10-12 DIAGNOSIS — N184 Chronic kidney disease, stage 4 (severe): Secondary | ICD-10-CM | POA: Diagnosis present

## 2023-10-12 DIAGNOSIS — Z853 Personal history of malignant neoplasm of breast: Secondary | ICD-10-CM | POA: Diagnosis not present

## 2023-10-12 DIAGNOSIS — E1142 Type 2 diabetes mellitus with diabetic polyneuropathy: Secondary | ICD-10-CM | POA: Diagnosis not present

## 2023-10-12 DIAGNOSIS — E785 Hyperlipidemia, unspecified: Secondary | ICD-10-CM | POA: Diagnosis present

## 2023-10-12 DIAGNOSIS — M5416 Radiculopathy, lumbar region: Secondary | ICD-10-CM

## 2023-10-12 DIAGNOSIS — M48062 Spinal stenosis, lumbar region with neurogenic claudication: Secondary | ICD-10-CM | POA: Diagnosis present

## 2023-10-12 DIAGNOSIS — M1A9XX1 Chronic gout, unspecified, with tophus (tophi): Secondary | ICD-10-CM

## 2023-10-12 DIAGNOSIS — E1122 Type 2 diabetes mellitus with diabetic chronic kidney disease: Secondary | ICD-10-CM

## 2023-10-12 DIAGNOSIS — I2510 Atherosclerotic heart disease of native coronary artery without angina pectoris: Secondary | ICD-10-CM | POA: Diagnosis present

## 2023-10-12 DIAGNOSIS — R739 Hyperglycemia, unspecified: Secondary | ICD-10-CM | POA: Diagnosis not present

## 2023-10-12 DIAGNOSIS — I200 Unstable angina: Secondary | ICD-10-CM | POA: Diagnosis present

## 2023-10-12 DIAGNOSIS — E1165 Type 2 diabetes mellitus with hyperglycemia: Secondary | ICD-10-CM

## 2023-10-12 DIAGNOSIS — F332 Major depressive disorder, recurrent severe without psychotic features: Secondary | ICD-10-CM | POA: Diagnosis present

## 2023-10-12 DIAGNOSIS — K2100 Gastro-esophageal reflux disease with esophagitis, without bleeding: Secondary | ICD-10-CM | POA: Diagnosis present

## 2023-10-12 DIAGNOSIS — G8929 Other chronic pain: Secondary | ICD-10-CM | POA: Diagnosis present

## 2023-10-12 DIAGNOSIS — C50919 Malignant neoplasm of unspecified site of unspecified female breast: Secondary | ICD-10-CM | POA: Diagnosis present

## 2023-10-12 DIAGNOSIS — F0390 Unspecified dementia without behavioral disturbance: Secondary | ICD-10-CM | POA: Diagnosis not present

## 2023-10-12 DIAGNOSIS — R0789 Other chest pain: Secondary | ICD-10-CM | POA: Diagnosis not present

## 2023-10-12 DIAGNOSIS — M109 Gout, unspecified: Secondary | ICD-10-CM | POA: Diagnosis present

## 2023-10-12 DIAGNOSIS — R0989 Other specified symptoms and signs involving the circulatory and respiratory systems: Secondary | ICD-10-CM | POA: Diagnosis not present

## 2023-10-12 LAB — BASIC METABOLIC PANEL WITH GFR
Anion gap: 13 (ref 5–15)
BUN: 62 mg/dL — ABNORMAL HIGH (ref 8–23)
CO2: 25 mmol/L (ref 22–32)
Calcium: 8.5 mg/dL — ABNORMAL LOW (ref 8.9–10.3)
Chloride: 98 mmol/L (ref 98–111)
Creatinine, Ser: 2.36 mg/dL — ABNORMAL HIGH (ref 0.44–1.00)
GFR, Estimated: 20 mL/min — ABNORMAL LOW (ref >60)
Glucose, Bld: 251 mg/dL — ABNORMAL HIGH (ref 70–99)
Potassium: 4.3 mmol/L (ref 3.5–5.1)
Sodium: 136 mmol/L (ref 135–145)

## 2023-10-12 LAB — CBC
HCT: 27.8 % — ABNORMAL LOW (ref 36.0–46.0)
Hemoglobin: 8.6 g/dL — ABNORMAL LOW (ref 12.0–15.0)
MCH: 29.4 pg (ref 26.0–34.0)
MCHC: 30.9 g/dL (ref 30.0–36.0)
MCV: 94.9 fL (ref 80.0–100.0)
Platelets: 195 10*3/uL (ref 150–400)
RBC: 2.93 MIL/uL — ABNORMAL LOW (ref 3.87–5.11)
RDW: 14.3 % (ref 11.5–15.5)
WBC: 15.4 10*3/uL — ABNORMAL HIGH (ref 4.0–10.5)
nRBC: 0 % (ref 0.0–0.2)

## 2023-10-12 LAB — CBG MONITORING, ED
Glucose-Capillary: 227 mg/dL — ABNORMAL HIGH (ref 70–99)
Glucose-Capillary: 329 mg/dL — ABNORMAL HIGH (ref 70–99)
Glucose-Capillary: 371 mg/dL — ABNORMAL HIGH (ref 70–99)

## 2023-10-12 LAB — TROPONIN I (HIGH SENSITIVITY)
Troponin I (High Sensitivity): 39 ng/L — ABNORMAL HIGH (ref <18)
Troponin I (High Sensitivity): 38 ng/L — ABNORMAL HIGH (ref <18)

## 2023-10-12 MED ORDER — PANTOPRAZOLE SODIUM 20 MG PO TBEC: 20.0000 mg | DELAYED_RELEASE_TABLET | Freq: Every day | ORAL | Status: DC

## 2023-10-12 MED ORDER — BUDESON-GLYCOPYRROL-FORMOTEROL 160-9-4.8 MCG/ACT IN AERO
2.0000 | INHALATION_SPRAY | Freq: Two times a day (BID) | RESPIRATORY_TRACT | Status: DC
  Administered 2023-10-12 – 2023-10-13 (×2): 2 via RESPIRATORY_TRACT
  Filled 2023-10-12: qty 5.9

## 2023-10-12 MED ORDER — ONDANSETRON HCL 4 MG/2ML IJ SOLN: 4.0000 mg | Freq: Four times a day (QID) | INTRAMUSCULAR | Status: DC | PRN

## 2023-10-12 MED ORDER — MEMANTINE HCL 10 MG PO TABS: 5.0000 mg | ORAL_TABLET | Freq: Two times a day (BID) | ORAL | Status: DC

## 2023-10-12 MED ORDER — ALBUTEROL SULFATE (2.5 MG/3ML) 0.083% IN NEBU
2.5000 mg | INHALATION_SOLUTION | Freq: Four times a day (QID) | RESPIRATORY_TRACT | Status: DC | PRN
  Filled 2023-10-12: qty 3

## 2023-10-12 MED ORDER — TORSEMIDE 20 MG PO TABS: 20.0000 mg | ORAL_TABLET | Freq: Every day | ORAL | Status: DC

## 2023-10-12 MED ORDER — ATORVASTATIN CALCIUM 80 MG PO TABS
80.0000 mg | ORAL_TABLET | Freq: Every day | ORAL | Status: DC
  Administered 2023-10-12: 80 mg via ORAL
  Filled 2023-10-12: qty 1

## 2023-10-12 MED ORDER — PREDNISONE 20 MG PO TABS
40.0000 mg | ORAL_TABLET | Freq: Every day | ORAL | Status: DC
  Administered 2023-10-13: 40 mg via ORAL
  Filled 2023-10-12: qty 2

## 2023-10-12 MED ORDER — RANOLAZINE ER 500 MG PO TB12
500.0000 mg | ORAL_TABLET | Freq: Two times a day (BID) | ORAL | Status: DC
  Administered 2023-10-12: 500 mg via ORAL
  Filled 2023-10-12: qty 1

## 2023-10-12 MED ORDER — MONTELUKAST SODIUM 10 MG PO TABS
10.0000 mg | ORAL_TABLET | Freq: Every day | ORAL | Status: DC
  Administered 2023-10-12: 10 mg via ORAL
  Filled 2023-10-12: qty 1

## 2023-10-12 MED ORDER — INSULIN ASPART 100 UNIT/ML IJ SOLN
0.0000 [IU] | Freq: Three times a day (TID) | INTRAMUSCULAR | Status: DC
  Administered 2023-10-12: 11 [IU] via SUBCUTANEOUS
  Administered 2023-10-13: 3 [IU] via SUBCUTANEOUS

## 2023-10-12 MED ORDER — INSULIN GLARGINE (2 UNIT DIAL) 300 UNIT/ML ~~LOC~~ SOPN: 22.0000 [IU] | PEN_INJECTOR | Freq: Two times a day (BID) | SUBCUTANEOUS | Status: DC

## 2023-10-12 MED ORDER — ACETAMINOPHEN 325 MG PO TABS
650.0000 mg | ORAL_TABLET | ORAL | Status: DC | PRN
Start: 2023-10-12 — End: 2023-10-13

## 2023-10-12 MED ORDER — AMITRIPTYLINE HCL 25 MG PO TABS
75.0000 mg | ORAL_TABLET | Freq: Every day | ORAL | Status: DC
  Administered 2023-10-12: 75 mg via ORAL
  Filled 2023-10-12: qty 3

## 2023-10-12 MED ORDER — APIXABAN 5 MG PO TABS
5.0000 mg | ORAL_TABLET | Freq: Two times a day (BID) | ORAL | Status: DC
  Administered 2023-10-12: 5 mg via ORAL
  Filled 2023-10-12: qty 1

## 2023-10-12 MED ORDER — CALCITRIOL 0.25 MCG PO CAPS
0.2500 ug | ORAL_CAPSULE | Freq: Every day | ORAL | Status: DC
  Filled 2023-10-12: qty 1

## 2023-10-12 MED ORDER — CARVEDILOL 12.5 MG PO TABS
12.5000 mg | ORAL_TABLET | Freq: Two times a day (BID) | ORAL | Status: DC
  Administered 2023-10-12: 12.5 mg via ORAL
  Filled 2023-10-12: qty 1

## 2023-10-12 MED ORDER — MELATONIN 5 MG PO TABS
5.0000 mg | ORAL_TABLET | Freq: Every evening | ORAL | Status: DC | PRN
  Administered 2023-10-12: 5 mg via ORAL
  Filled 2023-10-12: qty 1

## 2023-10-12 MED ORDER — INSULIN GLARGINE-YFGN 100 UNIT/ML ~~LOC~~ SOLN
22.0000 [IU] | Freq: Two times a day (BID) | SUBCUTANEOUS | Status: DC
  Administered 2023-10-12: 22 [IU] via SUBCUTANEOUS
  Filled 2023-10-12 (×3): qty 0.22

## 2023-10-12 MED ORDER — FEBUXOSTAT 40 MG PO TABS
40.0000 mg | ORAL_TABLET | Freq: Every day | ORAL | Status: DC
  Filled 2023-10-12: qty 1

## 2023-10-12 MED ORDER — BUTALBITAL-APAP-CAFFEINE 50-325-40 MG PO TABS: 1.0000 | ORAL_TABLET | Freq: Four times a day (QID) | ORAL | Status: DC | PRN

## 2023-10-12 MED ORDER — HYDROXYZINE HCL 25 MG PO TABS: 25.0000 mg | ORAL_TABLET | Freq: Three times a day (TID) | ORAL | Status: DC | PRN

## 2023-10-12 NOTE — ED Triage Notes (Addendum)
 Pt BIB [facility] from home. Pt reports having a sudden onset of non-radiating chest pain. Family gave the pt 2 nitroglycerins prior to EMS arrival. Upon EMS arrival the pt's pain had subsided. EMS gave 1 324ASA. Hx of 3 heart attacks & hx dementia. Pt A&Ox4.   EMS 127/70 BP 99% RA 84 P 251 CBG

## 2023-10-12 NOTE — ED Notes (Signed)
 Dr Rufina Cough notified of pt being lowered to floor of room at 1712.

## 2023-10-12 NOTE — Consult Note (Signed)
 Cardiology Consultation   Patient ID: [name] MRN: [id number]; DOB: 09/25/1934  Admit date: 10/12/2023 Date of Consult: 10/12/2023  PCP:  Catheryn Cluck, MD   [facility] Providers Cardiologist:  Hazle Lites, MD        Patient Profile: [name] is a 89 y.o. female with a hx of dementia, CAD with extensive PCI to RCA (last intervention 2021), hyperlipidemia, HFpEF, history of breast cancer, history of PE on Eliquis , polymyalgia rheumatica, OSA, CKD 4, COPD, IDDM, recent temporal arteritis complicated by HHS who is being seen 10/12/2023 for the evaluation of chest pain at the request of Dr. Rufina Cough.  History of Present Illness:  [name] has CAD with prior stenting including BMS-RCA in 2004, DES x 2-mid RCA 2012, recent NSTEMI 02/2020 resulting in DES-RCA.  That angiography showed multivessel CAD but severe ISR in proximal RCA and mid to distal RCA, both treated with DES.  She has diffuse mild to moderate disease in the left main as well as LCx and LAD but nonobstructive and treated medically.  She has a history of prior PE/DVT and was on oral anticoagulation.  Per prior notes, Xarelto  was held given need for DAPT after heart catheterization in 2021, through shared decision making with Dr. Salomon Cree.  She was last seen in clinic 02/2023 with Dr. Alroy Aspen and was wheelchair bound due to knee issues.  She has a history of gastric bypass for morbid obesity.  She is maintained on 40 mg daily Lasix  for HFpEF.  Unfortunately she was seen in the ER 03/2022 with DVT and was started on Eliquis .  She [name] presented later that month with melena and chest pain.  VQ scan positive for PE.  She has been continued on apixaban .  She has unfortunately had several recent hospitalizations.  She was seen in the ER 09/05/2023 with general weakness and trouble speaking intermittently x 2 weeks found to be hyperkalemic.  She also reported muscle weakness in her legs and shortness of breath/DOE.  She  reportedly was supposed to be on Lokelma  but ran out of it and did not want to refill it because she did not like the flavor.  She was discharged on Lokelma  without admission.  She was instructed to follow-up closely with nephrology.  Potassium was 5.8.  She was hospitalized 09/12/2023 and diagnosed with temporal arteritis based on clinical features, biopsy not completed, and treated with steroids.  Unfortunately she was rehospitalized 10/05/2023 with hyperglycemia found to have HHS requiring insulin  infusion.  Her insulin  regimen was adjusted with plans to monitor closely with PCP.  Since discharge on 10/07/2023 she has had issues with hyperglycemia.  She is currently on a steroid taper and has had improvements in vision and headaches.  Today, 10/12/2023, she was BIP VIG EMS from home with sudden onset nonradiating chest pain.  Chest pain subsided with NTG x 2.  EKG with stable repolarization abnormalities.  She was admitted to the hospitalist service and troponins were flat at 39 and 38.  Cardiology consulted given history of CAD.  Daughter at bedside helps with history.  She lives with her daughter and they sleep in the same room.  They report that last evening the patient got up to the bedside commode.  When finished, she was unable to get back into bed and woke her daughter.  Daughter helped her back to the bed.  After she was situated in bed she developed severe sharp chest pain across her precordium.  She was  able to take NTG x 2 with resolution of the chest pain.  This prompted ER evaluation.  She does report that she took 1 NTG about a month ago for chest pain.  She reports this chest pain was different than her prior pain at the time of her NSTEMI in 2021 and that this was a sharp intense pain while in 2021 she developed a dull pressure similar to indigestion.  She has had no other associated symptoms.  She remains chest pain-free.  The daughter confirms some mild dementia.  The patient becomes tearful  during our conversation.   Past Medical History:  Diagnosis Date   Abdominal pain 01/26/2012   Acute deep vein thrombosis (DVT) of femoral vein of right lower extremity (HCC) 03/26/2022   Acute kidney injury superimposed on chronic kidney disease (HCC) 12/18/2014   Acute upper GI bleed 03/26/2022   Allergy     takes Mucinex  daily as needed   Anemia, unspecified    Anxiety    takes Clonazepam  daily as needed   Anxiety associated with depression 06/08/2017   Arthritis    Breast cancer (HCC)    Breast cancer screening 02/19/2014   Breast mass 10/27/2014   Chronic diastolic CHF (congestive heart failure) (HCC)    takes Furosemide  daily   Chronic pain syndrome 02/06/2015   CKD (chronic kidney disease), stage III (HCC)    Coronary artery disease    a. s/p IMI 2004 tx with BMS to RCA;  b. s/p Promus DES to RCA 2/12 c. abnormal nuc 2016 -> cath with med rx. d. NSTEMI 02/2020  mv CAD with severe ISR in pRCA and m/dRCA lesion both treated with DES.   Debility 08/01/2012   Dementia (HCC)    Depression    takes Cymbalta  daily   Diabetes mellitus    insulin  daily   DOE (dyspnea on exertion) 06/24/2014   Dysphagia, unspecified(787.20) 07/31/2012   Fungal dermatitis 11/13/2007   Qualifier: Diagnosis of  By: Minnette Amato  MD, Ronie Cohen    GERD (gastroesophageal reflux disease)    takes Protonix  daily   Gout, unspecified    Headache    occasionally   History of anemia due to chronic kidney disease 03/26/2022   History of blood clots    History of deep venous thrombosis 01/27/2012   History of pulmonary embolus (PE) 04/22/2014   Hyperkalemia 07/26/2017   Hyperlipidemia    takes Pravastatin  daily   Hypertension    Insomnia    Joint pain    Joint swelling    Morbid obesity (HCC)    Multiple benign nevi 11/15/2016   Myocardial infarction [facility]) 2004   Non-STEMI (non-ST elevated myocardial infarction) (HCC) 02/15/2020   Nonintractable headache 01/07/2008   Qualifier: Diagnosis of   By:  Minnette Amato  MD, Ronie Cohen        Numbness    Obstructive sleep apnea    does not wear cpap   Osteoarthritis    Osteoarthritis    Osteoarthrosis, unspecified whether generalized or localized, lower leg    Pain in the chest 12/31/2013   Pain, chronic    Personal history of other diseases of the digestive system 12/03/2006   Qualifier: Diagnosis of  By: Minnette Amato  MD, Ronie Cohen    Polymyalgia rheumatica [facility])    Presence of stent in right coronary artery    Pulmonary emboli (HCC) 01/2012   felt to need lifelong anticoagulation but Xarelto  dc in 02/2020 due to need for DAPT   Situational depression 06/04/2009  Qualifier: Diagnosis of  By: Minnette Amato  MD, Ronie Cohen    Syncope, vasovagal 07/04/2021   Type 2 diabetes mellitus with hyperlipidemia (HCC) 02/16/2022   Urinary incontinence    takes Linzess  daily   Vaginitis and vulvovaginitis 06/23/2016   Vertigo    hx of;was taking Meclizine  if needed   Wears dentures     Past Surgical History:  Procedure Laterality Date   ABDOMINAL HYSTERECTOMY     partial   APPENDECTOMY     blood clots/legs and lungs  2013   BREAST BIOPSY Left 07/22/2014   BREAST BIOPSY Left 02/10/2013   BREAST LUMPECTOMY Left 11/05/2014   BREAST LUMPECTOMY WITH RADIOACTIVE SEED LOCALIZATION Left 11/05/2014   Procedure: LEFT BREAST LUMPECTOMY WITH RADIOACTIVE SEED LOCALIZATION;  Surgeon: Oza Blumenthal, MD;  Location: [facility] OR;  Service: General;  Laterality: Left;   CARDIAC CATHETERIZATION     COLONOSCOPY     CORONARY ANGIOPLASTY  2   CORONARY STENT INTERVENTION N/A 02/17/2020   Procedure: CORONARY STENT INTERVENTION;  Surgeon: Arleen Lacer, MD;  Location: [facility] INVASIVE CV LAB;  Service: Cardiovascular;  Laterality: N/A;   ESOPHAGOGASTRODUODENOSCOPY (EGD) WITH PROPOFOL  N/A 11/07/2016   Procedure: ESOPHAGOGASTRODUODENOSCOPY (EGD) WITH PROPOFOL ;  Surgeon: Kenney Peacemaker, MD;  Location: [facility] ENDOSCOPY;  Service: Endoscopy;  Laterality: N/A;   ESOPHAGOGASTRODUODENOSCOPY  (EGD) WITH PROPOFOL  N/A 01/05/2023   Procedure: ESOPHAGOGASTRODUODENOSCOPY (EGD) WITH PROPOFOL ;  Surgeon: Tobin Forts, MD;  Location: [facility] ENDOSCOPY;  Service: Gastroenterology;  Laterality: N/A;   EXCISION OF SKIN TAG Right 11/05/2014   Procedure: EXCISION OF RIGHT EYELID SKIN TAG;  Surgeon: Oza Blumenthal, MD;  Location: [facility] OR;  Service: General;  Laterality: Right;   EYE SURGERY Bilateral    cataract    GASTRIC BYPASS  1977    reversed in [facility], [facility]   LEFT HEART CATH AND CORONARY ANGIOGRAPHY N/A 02/17/2020   Procedure: LEFT HEART CATH AND CORONARY ANGIOGRAPHY;  Surgeon: Arleen Lacer, MD;  Location: [facility] INVASIVE CV LAB;  Service: Cardiovascular;  Laterality: N/A;   LEFT HEART CATHETERIZATION WITH CORONARY ANGIOGRAM N/A 06/29/2014   Procedure: LEFT HEART CATHETERIZATION WITH CORONARY ANGIOGRAM;  Surgeon: Millicent Ally, MD;  Location: [facility] CATH LAB;  Service: Cardiovascular;  Laterality: N/A;   MEMBRANE PEEL Right 10/23/2018   Procedure: MEMBRANE PEEL;  Surgeon: Shon Downing, MD;  Location: [facility] OR;  Service: Ophthalmology;  Laterality: Right;   MI with stent placement  2004   PARS PLANA VITRECTOMY Right 10/23/2018   Procedure: PARS PLANA VITRECTOMY WITH 25 GAUGE;  Surgeon: Shon Downing, MD;  Location: [facility] OR;  Service: Ophthalmology;  Laterality: Right;     Home Medications:  Prior to Admission medications   Medication Sig Start Date End Date Taking? Authorizing Provider  albuterol  (PROAIR  HFA) 108 (90 Base) MCG/ACT inhaler Inhale 1-2 puffs into the lungs every 6 (six) hours as needed for wheezing or shortness of breath. 12/19/18  Yes Verma Gobble, NP  amitriptyline  (ELAVIL ) 75 MG tablet TAKE 1 TABLET BY MOUTH AT BEDTIME 09/11/23  Yes Catheryn Cluck, MD  atorvastatin  (LIPITOR ) 80 MG tablet TAKE 1 TABLET BY MOUTH EVERY DAY Patient taking differently: Take 80 mg by mouth at bedtime. 12/04/22  Yes Catheryn Cluck, MD  azelastine  (ASTELIN ) 0.1 % nasal spray Place 2 sprays into  both nostrils 2 (two) times daily. 10/09/23  Yes Catheryn Cluck, MD  butalbital -acetaminophen -caffeine  (FIORICET ) 50-325-40 MG tablet Take 1 tablet by mouth every 6 (six) hours as needed for  headache. 06/08/23  Yes Krishnan, Gokul, MD  calcitRIOL (ROCALTROL) 0.25 MCG capsule Take 0.25 mcg by mouth daily. 10/02/23  Yes [provider]  carvedilol  (COREG ) 12.5 MG tablet TAKE 1 TABLET BY MOUTH 2 TIMES DAILY 05/17/23  Yes Catheryn Cluck, MD  cetirizine  (ZYRTEC ) 10 MG tablet Take 10 mg by mouth daily as needed for allergies or rhinitis.   Yes [provider]  diclofenac  Sodium (VOLTAREN ) 1 % GEL Apply 2 g topically 4 (four) times daily. Patient taking differently: Apply 2 g topically 4 (four) times daily as needed (pain). 02/23/22  Yes Catheryn Cluck, MD  ELIQUIS  5 MG TABS tablet TAKE 1 TABLET BY MOUTH 2 TIMES DAILY 04/09/23  Yes Catheryn Cluck, MD  febuxostat  (ULORIC ) 40 MG tablet TAKE 1 TABLET BY MOUTH EVERY DAY 02/22/23  Yes Catheryn Cluck, MD  fluticasone  (FLONASE ) 50 MCG/ACT nasal spray Place 2 sprays into both nostrils daily. 10/09/23  Yes Catheryn Cluck, MD  Fluticasone -Umeclidin-Vilant (TRELEGY ELLIPTA ) 100-62.5-25 MCG/ACT AEPB Inhale 1 puff into the lungs daily. 01/10/23 01/05/24 Yes Catheryn Cluck, MD  folic acid  (FOLVITE ) 1 MG tablet TAKE 1 TABLET BY MOUTH EVERY DAY 05/17/23  Yes Catheryn Cluck, MD  glucagon  1 MG injection Inject 1 mg into the vein once as needed for up to 1 dose. 04/13/22  Yes Catheryn Cluck, MD  glucose 4 GM chewable tablet Chew 1 tablet (4 g total) by mouth as needed for low blood sugar. 04/05/22  Yes Catheryn Cluck, MD  hydrOXYzine  (ATARAX ) 25 MG tablet TAKE 1 TABLET BY MOUTH EVERY 8 HOURS AS NEEDED FOR ANXIETY OR ITCHING (NASAL AND THROAT CONGESTION) 06/05/23  Yes Catheryn Cluck, MD  insulin  aspart (NOVOLOG  FLEXPEN) 100 UNIT/ML FlexPen Before each meal 3 times a day, 140-199 - 2 units, 200-250 - 6 units, 251-299 - 8 units,  300-349 - 12  units,  350 or above 14 units. 10/07/23  Yes Singh, Prashant K, MD  insulin  glargine, 2 Unit Dial , (TOUJEO  MAX SOLOSTAR) 300 UNIT/ML Solostar Pen Inject 22 Units into the skin 2 (two) times daily. 10/07/23  Yes Singh, Prashant K, MD  ipratropium-albuterol  (DUONEB) 0.5-2.5 (3) MG/3ML SOLN Take 3 mLs by nebulization every 6 (six) hours as needed (cough, wheezing, or shortness of breath). 10/09/23 01/07/24 Yes Catheryn Cluck, MD  meclizine  (ANTIVERT ) 25 MG tablet Take 1 tablet (25 mg total) by mouth 2 (two) times daily. 07/20/23  Yes Catheryn Cluck, MD  melatonin 5 MG TABS Take 1 tablet (5 mg total) by mouth at bedtime as needed. 06/08/23  Yes Maylene Spear, MD  memantine  (NAMENDA ) 5 MG tablet TAKE 2 TABLETS BY MOUTH EVERY MORNING and TAKE 1 TABLET EVERY EVENING 10/11/23  Yes Catheryn Cluck, MD  montelukast  (SINGULAIR ) 10 MG tablet Take 1 tablet (10 mg total) by mouth at bedtime. 04/24/22  Yes Catheryn Cluck, MD  nitroGLYCERIN  (NITROSTAT ) 0.4 MG SL tablet Dissolve 1 tablet under the tongue every 5 minutes as needed for chest pain. Max of 3 doses, then 911. 09/20/23  Yes Nahser, Lela Purple, MD  nystatin  (MYCOSTATIN /NYSTOP ) powder Apply 1 Application topically 3 (three) times daily. 03/21/23  Yes Gavin Kast, FNP  ondansetron  (ZOFRAN ) 4 MG tablet Take 1 tablet (4 mg total) by mouth every 8 (eight) hours as needed for nausea or vomiting. 06/29/23  Yes Frankie Israel, MD  pantoprazole  (PROTONIX ) 20 MG tablet TAKE 1 TABLET BY MOUTH ONCE DAILY 10/05/23  Yes Catheryn Cluck,  MD  predniSONE  (DELTASONE ) 10 MG tablet Take 4 tablets (40 mg total) by mouth daily with breakfast. 09/25/23  Yes Phebe Brasil, MD  Semaglutide  (RYBELSUS) 7 MG TABS Take 1 tablet (7 mg total) by mouth daily. 10/09/23  Yes Catheryn Cluck, MD  torsemide  (DEMADEX ) 20 MG tablet TAKE 1 TABLET BY MOUTH EVERY DAY MAY TAKE AN EXTRA DOES FOR WEIGHT GAIN OF 3LBs IN 1 DAY OR 5LBS IN 1 WEEK OR FOR WORSENING SWELLING 10/05/23  Yes Catheryn Cluck,  MD  TYLENOL  500 MG tablet Take 1,000 mg by mouth every 6 (six) hours as needed for mild pain or headache.   Yes [provider]  TYLENOL  PM EXTRA STRENGTH 500-25 MG TABS tablet Take 2 tablets by mouth at bedtime.   Yes [provider]  Vitamin D , Ergocalciferol , (DRISDOL ) 1.25 MG (50000 UNIT) CAPS capsule TAKE 1 CAPSULE BY MOUTH ONCE WEEKLY ON MONDAY 10/08/23  Yes Frankie Israel, MD  Continuous Glucose Receiver (FREESTYLE LIBRE 3 READER) DEVI Continuous glucose monitor 06/21/23   Shamleffer, Ibtehal Jaralla, MD  Continuous Glucose Sensor (FREESTYLE LIBRE 14 DAY SENSOR) MISC PLACE 1 DEVICE ON THE SKIN AS DIRECTED EVERY 14 DAYS 07/16/23   Shamleffer, Ibtehal Jaralla, MD    Scheduled Meds:  amitriptyline   75 mg Oral QHS   apixaban   5 mg Oral BID   atorvastatin   80 mg Oral QHS   budesonide -glycopyrrolate -formoterol   2 puff Inhalation BID   [START ON 10/13/2023] calcitRIOL  0.25 mcg Oral Daily   carvedilol   12.5 mg Oral BID   [START ON 10/13/2023] febuxostat   40 mg Oral Daily   insulin  aspart  0-15 Units Subcutaneous TID WC   insulin  glargine-yfgn  22 Units Subcutaneous BID   [START ON 10/13/2023] memantine   5-10 mg Oral BID   montelukast   10 mg Oral QHS   [START ON 10/13/2023] pantoprazole   20 mg Oral Daily   [START ON 10/13/2023] predniSONE   40 mg Oral Q breakfast   [START ON 10/13/2023] torsemide   20 mg Oral Daily   Continuous Infusions:  PRN Meds: acetaminophen , albuterol , butalbital -acetaminophen -caffeine , hydrOXYzine , melatonin, ondansetron  (ZOFRAN ) IV  Allergies:    Allergies  Allergen Reactions   Sulfonamide Derivatives Swelling and Other (See Comments)    Mouth swelling- no resp issues noted   Duloxetine      Headaches   Lokelma  [Sodium Zirconium Cyclosilicate ] Other (See Comments)    Headache, stomach upset   Aricept  [Donepezil  Hcl] Diarrhea, Nausea And Vomiting and Other (See Comments)    GI upset/loose stools   Tramadol  Nausea And Vomiting    Social History:    Social History   Socioeconomic History   Marital status: Widowed    Spouse name: Not on file   Number of children: 3   Years of education: Not on file   Highest education level: High school graduate  Occupational History   Occupation: retired  Tobacco Use   Smoking status: Never    Passive exposure: Past   Smokeless tobacco: Never   Tobacco comments:    Both parents smoked, patient was exposed/ "Raised up in smoke, my whole life."  Vaping Use   Vaping status: Never Used  Substance and Sexual Activity   Alcohol use: No    Alcohol/week: 0.0 standard drinks of alcohol   Drug use: No   Sexual activity: Not on file  Other Topics Concern   Not on file  Social History Narrative   Not on file   Social Drivers of Health  Financial Resource Strain: Low Risk  (09/28/2023)   Overall Financial Resource Strain (CARDIA)    Difficulty of Paying Living Expenses: Not hard at all  Food Insecurity: No Food Insecurity (10/05/2023)   Hunger Vital Sign    Worried About Running Out of Food in the Last Year: Never true    Ran Out of Food in the Last Year: Never true  Transportation Needs: No Transportation Needs (10/05/2023)   PRAPARE - Administrator, Civil Service (Medical): No    Lack of Transportation (Non-Medical): No  Physical Activity: Inactive (09/28/2023)   Exercise Vital Sign    Days of Exercise per Week: 0 days    Minutes of Exercise per Session: 0 min  Stress: Stress Concern Present (09/28/2023)   [name] [employer]ational [employer] - Occupational Stress Questionnaire    Feeling of Stress : To some extent  Social Connections: Socially Isolated (10/05/2023)   Social Connection and Isolation Panel [NHANES]    Frequency of Communication with Friends and Family: Three times a week    Frequency of Social Gatherings with Friends and Family: More than three times a week    Attends Religious Services: Never    Database administrator or Organizations: No    Attends Occupational hygienist Meetings: Never    Marital Status: Widowed  Intimate Partner Violence: Not At Risk (10/05/2023)   Humiliation, Afraid, Rape, and Kick questionnaire    Fear of Current or Ex-Partner: No    Emotionally Abused: No    Physically Abused: No    Sexually Abused: No    Family History:    Family History  Problem Relation Age of Onset   Breast cancer Mother 53   Heart disease Mother    Throat cancer Father    Hypertension Father    Arthritis Father    Diabetes Father    Arthritis Sister    Obesity Sister    Diabetes Sister    Heart disease Cousin    Colon cancer Neg Hx    Stomach cancer Neg Hx    Esophageal cancer Neg Hx      ROS:  Please see the history of present illness.   All other ROS reviewed and negative.     Physical Exam/Data: Vitals:   10/12/23 0945 10/12/23 1029 10/12/23 1245 10/12/23 1500  BP: 117/70  (!) 137/51 (!) 121/51  Pulse: 60  77 84  Resp: 16  15 17   Temp:  97.6 F (36.4 C)    TempSrc:  Oral    SpO2: 100%  100% 100%   No intake or output data in the 24 hours ending 10/12/23 1541    10/09/2023    2:19 PM 10/05/2023   11:00 PM 10/05/2023    4:04 PM  Last 3 Weights  Weight (lbs) 268 lb 15.4 oz 259 lb 7.7 oz 268 lb 15.4 oz  Weight (kg) 122 kg 117.7 kg 122 kg     There is no height or weight on file to calculate BMI.  General:  elderly female in NAD HEENT: normal Neck: no JVD Cardiac:  normal S1, S2; RRR; no murmur  Lungs:  clear to auscultation bilaterally, no wheezing, rhonchi or rales  Abd: soft, nontender, no hepatomegaly  Ext: no edema Skin: right lower leg and foot cool to touch  Neuro:  CNs 2-12 intact, no focal abnormalities noted Psych:  Normal affect   EKG:  The EKG was personally reviewed and demonstrates: SR with HR 64, ST  and T wave changes consistent with repolarization abnormalities, Q waves in inferior leads  Telemetry:  Telemetry was personally reviewed and demonstrates:  sinus rhythm with HR 70s, PVCs  Relevant CV  Studies:  Echo 06/2021:  1. Left ventricular ejection fraction, by estimation, is 55 to 60%. The  left ventricle has normal function. Left ventricular endocardial border  not optimally defined to evaluate regional wall motion- there are no wall  motion abnormalities seen in views  obtained. Left ventricular diastolic parameters are consistent with Grade  I diastolic dysfunction (impaired relaxation).   2. Right ventricular systolic function is normal. The right ventricular  size is normal.   3. The mitral valve was not well visualized. No evidence of mitral valve  regurgitation. No evidence of mitral stenosis.   4. The aortic valve was not well visualized. Aortic valve regurgitation  is not visualized.    Laboratory Data: High Sensitivity Troponin:   Recent Labs  Lab 10/12/23 0706 10/12/23 1037  TROPONINIHS 39* 38*     Chemistry Recent Labs  Lab 10/06/23 0224 10/07/23 0224 10/12/23 0706  NA 135 133* 136  K 4.3 4.4 4.3  CL 97* 96* 98  CO2 25 26 25   GLUCOSE 256* 308* 251*  BUN 70* 78* 62*  CREATININE 2.44* 2.73* 2.36*  CALCIUM  8.3* 8.2* 8.5*  GFRNONAA 19* 16* 20*  ANIONGAP 13 11 13     No results for input(s): "PROT", "ALBUMIN ", "AST", "ALT", "ALKPHOS", "BILITOT" in the last 168 hours. Lipids No results for input(s): "CHOL", "TRIG", "HDL", "LABVLDL", "LDLCALC", "CHOLHDL" in the last 168 hours.  Hematology Recent Labs  Lab 10/05/23 2248 10/12/23 0706  WBC 18.7* 15.4*  RBC 3.03* 2.93*  HGB 8.8* 8.6*  HCT 27.2* 27.8*  MCV 89.8 94.9  MCH 29.0 29.4  MCHC 32.4 30.9  RDW 13.5 14.3  PLT 166 195   Thyroid  No results for input(s): "TSH", "FREET4" in the last 168 hours.  BNPNo results for input(s): "BNP", "PROBNP" in the last 168 hours.  DDimer No results for input(s): "DDIMER" in the last 168 hours.  Radiology/Studies:  DG Chest Portable 1 View Result Date: 10/12/2023 EXAM: 1 VIEW XRAY OF THE CHEST 10/12/2023 07:34:00 AM COMPARISON: 1-view chest x-ray 12/29/2022  CLINICAL HISTORY: Chest pain. FINDINGS: LUNGS AND PLEURA: Lung volumes are low. No focal airspace disease is present. No pulmonary edema. No pleural effusion. No pneumothorax. HEART AND MEDIASTINUM: No acute abnormality of the cardiac and mediastinal silhouettes. BONES AND SOFT TISSUES: No acute osseous abnormality. IMPRESSION: 1. No acute process. Electronically signed by: Audree Leas MD 10/12/2023 07:41 AM EDT RP Workstation: [id number]     Assessment and Plan:  Chest pain -Troponin 39--38 - EKG with ST changes consistent with repolarization abnormalities, appears stable from prior tracings -- chest pain resolved after NTG x 2 -- no current chest pain Chest pain may represent angina - chest pain after moving back to the bed and resolved with NTG. Troponins have been flat. EKG appears stable from prior tracings. Discussed that her renal function precludes invasive angiography. Her CE are reassuring. I think this will be best treated medically. Given recent headaches and temporal arteritis, imdur  may not be the best option. Can trial ranexa, although the patient states she is frustrated by the quantity of her pills already.   CAD NSTEMI 2021 - hx of stenting in the RCA and ISR - mild nonobstructive disease in the LM, LCX, and LAD - not on antiplatelet therapy in the setting of eliquis  - continue statin -  would give new NTG script, suspect her supply may be expired   HFpEF - she takes 20 mg torsemide  daily   Recurrent PE/DVT - now on eliquis , per primary   Recent temporal arteritis - remains on prednisone    Recent hyperkalemia CKD IV - continue to follow with nephrology   DM2 with hyperglycemia Neuropathy  - has been more difficult to control recently with prednisone  on board - she is not ambulatory    Risk Assessment/Risk Scores:    TIMI Risk Score for Unstable Angina or Non-ST Elevation MI:   [name] TIMI risk score is 5, which indicates a 26% risk of  all cause mortality, new or recurrent myocardial infarction or need for urgent revascularization in the next 14 days.         For questions or updates, please contact [facility] Please consult [email or web address] for contact info under    Signed, Lamond Pilot, PA  10/12/2023 3:41 PM

## 2023-10-12 NOTE — ED Provider Notes (Signed)
 [facility] Provider Note   CSN: [id number] Arrival date & time: 10/12/23  4098     History  Chief Complaint  Patient presents with   Chest Pain    [name] is a 89 y.o. female.  Patient with past medical history of hyperlipidemia, history of MI, CHF, history of breast cancer, history of PE on Eliquis , DM2, polymyalgia rheumatica, chronic kidney disease, obstructive sleep apnea presenting to emergency room with complaint of chest pain.  Patient reports that chest pain started on the left side and radiated across the entire front chest.  She thinks it lasted 15 to 20 minutes.  She tried nitroglycerin .  No longer has pain.  Denies any shortness of breath, cough or fever.   Chest Pain      Home Medications Prior to Admission medications   Medication Sig Start Date End Date Taking? Authorizing Provider  albuterol  (PROAIR  HFA) 108 (90 Base) MCG/ACT inhaler Inhale 1-2 puffs into the lungs every 6 (six) hours as needed for wheezing or shortness of breath. 12/19/18  Yes Verma Gobble, NP  amitriptyline  (ELAVIL ) 75 MG tablet TAKE 1 TABLET BY MOUTH AT BEDTIME 09/11/23  Yes Catheryn Cluck, MD  atorvastatin  (LIPITOR ) 80 MG tablet TAKE 1 TABLET BY MOUTH EVERY DAY Patient taking differently: Take 80 mg by mouth at bedtime. 12/04/22  Yes Catheryn Cluck, MD  azelastine  (ASTELIN ) 0.1 % nasal spray Place 2 sprays into both nostrils 2 (two) times daily. 10/09/23  Yes Catheryn Cluck, MD  butalbital -acetaminophen -caffeine  (FIORICET ) 50-325-40 MG tablet Take 1 tablet by mouth every 6 (six) hours as needed for headache. 06/08/23  Yes Maylene Spear, MD  calcitRIOL  (ROCALTROL ) 0.25 MCG capsule Take 0.25 mcg by mouth daily. 10/02/23  Yes [provider]  carvedilol  (COREG ) 12.5 MG tablet TAKE 1 TABLET BY MOUTH 2 TIMES DAILY 05/17/23  Yes Catheryn Cluck, MD  cetirizine  (ZYRTEC ) 10 MG tablet Take 10 mg by mouth daily as needed for allergies  or rhinitis.   Yes [provider]  diclofenac  Sodium (VOLTAREN ) 1 % GEL Apply 2 g topically 4 (four) times daily. Patient taking differently: Apply 2 g topically 4 (four) times daily as needed (pain). 02/23/22  Yes Catheryn Cluck, MD  ELIQUIS  5 MG TABS tablet TAKE 1 TABLET BY MOUTH 2 TIMES DAILY 04/09/23  Yes Catheryn Cluck, MD  febuxostat  (ULORIC ) 40 MG tablet TAKE 1 TABLET BY MOUTH EVERY DAY 02/22/23  Yes Catheryn Cluck, MD  fluticasone  (FLONASE ) 50 MCG/ACT nasal spray Place 2 sprays into both nostrils daily. 10/09/23  Yes Catheryn Cluck, MD  Fluticasone -Umeclidin-Vilant (TRELEGY ELLIPTA ) 100-62.5-25 MCG/ACT AEPB Inhale 1 puff into the lungs daily. 01/10/23 01/05/24 Yes Catheryn Cluck, MD  folic acid  (FOLVITE ) 1 MG tablet TAKE 1 TABLET BY MOUTH EVERY DAY 05/17/23  Yes Catheryn Cluck, MD  glucagon  1 MG injection Inject 1 mg into the vein once as needed for up to 1 dose. 04/13/22  Yes Catheryn Cluck, MD  glucose 4 GM chewable tablet Chew 1 tablet (4 g total) by mouth as needed for low blood sugar. 04/05/22  Yes Catheryn Cluck, MD  hydrOXYzine  (ATARAX ) 25 MG tablet TAKE 1 TABLET BY MOUTH EVERY 8 HOURS AS NEEDED FOR ANXIETY OR ITCHING (NASAL AND THROAT CONGESTION) 06/05/23  Yes Catheryn Cluck, MD  insulin  aspart (NOVOLOG  FLEXPEN) 100 UNIT/ML FlexPen Before each meal 3 times a day, 140-199 - 2 units, 200-250 -  6 units, 251-299 - 8 units,  300-349 - 12 units,  350 or above 14 units. 10/07/23  Yes Singh, Prashant K, MD  insulin  glargine, 2 Unit Dial , (TOUJEO  MAX SOLOSTAR) 300 UNIT/ML Solostar Pen Inject 22 Units into the skin 2 (two) times daily. 10/07/23  Yes Singh, Prashant K, MD  ipratropium-albuterol  (DUONEB) 0.5-2.5 (3) MG/3ML SOLN Take 3 mLs by nebulization every 6 (six) hours as needed (cough, wheezing, or shortness of breath). 10/09/23 01/07/24 Yes Catheryn Cluck, MD  meclizine  (ANTIVERT ) 25 MG tablet Take 1 tablet (25 mg total) by mouth 2 (two) times daily. 07/20/23  Yes  Catheryn Cluck, MD  melatonin 5 MG TABS Take 1 tablet (5 mg total) by mouth at bedtime as needed. 06/08/23  Yes Maylene Spear, MD  memantine  (NAMENDA ) 5 MG tablet TAKE 2 TABLETS BY MOUTH EVERY MORNING and TAKE 1 TABLET EVERY EVENING 10/11/23  Yes Catheryn Cluck, MD  montelukast  (SINGULAIR ) 10 MG tablet Take 1 tablet (10 mg total) by mouth at bedtime. 04/24/22  Yes Catheryn Cluck, MD  nitroGLYCERIN  (NITROSTAT ) 0.4 MG SL tablet Dissolve 1 tablet under the tongue every 5 minutes as needed for chest pain. Max of 3 doses, then 911. 09/20/23  Yes Nahser, Lela Purple, MD  nystatin  (MYCOSTATIN /NYSTOP ) powder Apply 1 Application topically 3 (three) times daily. 03/21/23  Yes Gavin Kast, FNP  ondansetron  (ZOFRAN ) 4 MG tablet Take 1 tablet (4 mg total) by mouth every 8 (eight) hours as needed for nausea or vomiting. 06/29/23  Yes Frankie Israel, MD  pantoprazole  (PROTONIX ) 20 MG tablet TAKE 1 TABLET BY MOUTH ONCE DAILY 10/05/23  Yes Catheryn Cluck, MD  predniSONE  (DELTASONE ) 10 MG tablet Take 4 tablets (40 mg total) by mouth daily with breakfast. 09/25/23  Yes Phebe Brasil, MD  Semaglutide  (RYBELSUS ) 7 MG TABS Take 1 tablet (7 mg total) by mouth daily. 10/09/23  Yes Catheryn Cluck, MD  torsemide  (DEMADEX ) 20 MG tablet TAKE 1 TABLET BY MOUTH EVERY DAY MAY TAKE AN EXTRA DOES FOR WEIGHT GAIN OF 3LBs IN 1 DAY OR 5LBS IN 1 WEEK OR FOR WORSENING SWELLING 10/05/23  Yes Catheryn Cluck, MD  TYLENOL  500 MG tablet Take 1,000 mg by mouth every 6 (six) hours as needed for mild pain or headache.   Yes [provider]  TYLENOL  PM EXTRA STRENGTH 500-25 MG TABS tablet Take 2 tablets by mouth at bedtime.   Yes [provider]  Vitamin D , Ergocalciferol , (DRISDOL ) 1.25 MG (50000 UNIT) CAPS capsule TAKE 1 CAPSULE BY MOUTH ONCE WEEKLY ON MONDAY 10/08/23  Yes Frankie Israel, MD  Continuous Glucose Receiver (FREESTYLE LIBRE 3 READER) DEVI Continuous glucose monitor 06/21/23   Shamleffer, Julian Obey, MD  Continuous Glucose Sensor (FREESTYLE LIBRE 14 DAY SENSOR) MISC PLACE 1 DEVICE ON THE SKIN AS DIRECTED EVERY 14 DAYS 07/16/23   Shamleffer, Ibtehal Jaralla, MD      Allergies    Sulfonamide derivatives, Duloxetine , Lokelma  [sodium zirconium cyclosilicate ], Aricept  [donepezil  hcl], and Tramadol     Review of Systems   Review of Systems  Cardiovascular:  Positive for chest pain.    Physical Exam Updated Vital Signs BP (!) 137/51   Pulse 77   Temp 97.6 F (36.4 C) (Oral)   Resp 15   SpO2 100%  Physical Exam Vitals and nursing note reviewed.  Constitutional:      General: She is not in acute distress.    Appearance: She is not toxic-appearing.  HENT:  Head: Normocephalic and atraumatic.  Eyes:     General: No scleral icterus.    Conjunctiva/sclera: Conjunctivae normal.  Cardiovascular:     Rate and Rhythm: Normal rate and regular rhythm.     Pulses: Normal pulses.     Heart sounds: Normal heart sounds.  Pulmonary:     Effort: Pulmonary effort is normal. No respiratory distress.     Breath sounds: Normal breath sounds.  Abdominal:     General: Abdomen is flat. Bowel sounds are normal.     Palpations: Abdomen is soft.     Tenderness: There is no abdominal tenderness.  Musculoskeletal:     Right lower leg: No edema.     Left lower leg: No edema.  Skin:    General: Skin is warm and dry.     Findings: No lesion.  Neurological:     General: No focal deficit present.     Mental Status: She is alert and oriented to person, place, and time. Mental status is at baseline.     ED Results / Procedures / Treatments   Labs (all labs ordered are listed, but only abnormal results are displayed) Labs Reviewed  BASIC METABOLIC PANEL WITH GFR - Abnormal; Notable for the following components:      Result Value   Glucose, Bld 251 (*)    BUN 62 (*)    Creatinine, Ser 2.36 (*)    Calcium  8.5 (*)    GFR, Estimated 20 (*)    All other components within normal limits   CBC - Abnormal; Notable for the following components:   WBC 15.4 (*)    RBC 2.93 (*)    Hemoglobin 8.6 (*)    HCT 27.8 (*)    All other components within normal limits  CBG MONITORING, ED - Abnormal; Notable for the following components:   Glucose-Capillary 227 (*)    All other components within normal limits  TROPONIN I (HIGH SENSITIVITY) - Abnormal; Notable for the following components:   Troponin I (High Sensitivity) 39 (*)    All other components within normal limits  TROPONIN I (HIGH SENSITIVITY) - Abnormal; Notable for the following components:   Troponin I (High Sensitivity) 38 (*)    All other components within normal limits    EKG None  Radiology DG Chest Portable 1 View Result Date: 10/12/2023 EXAM: 1 VIEW XRAY OF THE CHEST 10/12/2023 07:34:00 AM COMPARISON: 1-view chest x-ray 12/29/2022 CLINICAL HISTORY: Chest pain. FINDINGS: LUNGS AND PLEURA: Lung volumes are low. No focal airspace disease is present. No pulmonary edema. No pleural effusion. No pneumothorax. HEART AND MEDIASTINUM: No acute abnormality of the cardiac and mediastinal silhouettes. BONES AND SOFT TISSUES: No acute osseous abnormality. IMPRESSION: 1. No acute process. Electronically signed by: Audree Leas MD 10/12/2023 07:41 AM EDT RP Workstation: [id number]    Procedures Procedures    Medications Ordered in ED Medications - No data to display  ED Course/ Medical Decision Making/ A&P             HEART Score: 8                    Medical Decision Making Amount and/or Complexity of Data Reviewed Labs: ordered. Radiology: ordered.  Risk Decision regarding hospitalization.   This patient presents to the ED for concern of Chest pain, this involves an extensive number of treatment options, and is a complaint that carries with it a high risk of complications and morbidity.  The differential diagnosis includes pulmonary  embolism, CHF, pneumonia, pneumothorax, ACS   Co morbidities that  complicate the patient evaluation  HTN, HLD, DM2   Lab Tests:  I personally interpreted labs.  The pertinent results include:   Mild leukocytosis 15.4, hemoglobin is 8.6  BMP shows baseline kidney function with GFR of 20 Troponin elevated at 39, repeat 38    Imaging Studies ordered:  I ordered imaging studies including chest x-ray  I independently visualized and interpreted imaging which showed no acute findings  I agree with the radiologist interpretation   Cardiac Monitoring: / EKG:  The patient was maintained on a cardiac monitor.  I personally viewed and interpreted the cardiac monitored which showed an underlying rhythm of: ST elevation in inferior leads, no acute STEMI   Consultations Obtained:  I requested consultation with the hospitalist,  and discussed lab and imaging findings as well as pertinent plan - they recommend: admit, I also consulted with cardiology who agree to see the patient.   Problem List / ED Course / Critical interventions / Medication management  Patient presenting with chest pain.  She is hemodynamically stable and well-appearing.  She is now chest pain-free.  Chest x-ray shows no acute findings.  She has not had fever or cough.  No sign of fluid overload on exam.  Chest pain was substernal and relieved with nitroglycerin  and aspirin .  She has history of prior stents.  She does have abnormal EKG and bumped troponins.  With her history of hypertension hyperlipidemia diabetes feel she would benefit from admission for high risk chest pain rule out.  Of consult cardiology who agreed to see the patient.  I have consulted the hospitalist who agreed to admit. Patient was given 2 of nitroglycerin  and EMS gave aspirin  prior to arrival. After nitroglycerin  and ASA, patient reports improvement in chest pain.  I have reviewed the patients home medicines and have made adjustments as needed           Final Clinical Impression(s) / ED Diagnoses Final  diagnoses:  Chest pain, unspecified type    Rx / DC Orders ED Discharge Orders     None         Eudora Heron, PA-C 10/12/23 1338    Auston Blush, MD 10/14/23 0730

## 2023-10-12 NOTE — ED Notes (Signed)
 This RN requested Semglee  from main pharmacy

## 2023-10-12 NOTE — ED Notes (Signed)
 Daughter was assisted NT to get pt from [facility] to bed. Pt was unable to stand and lowered herself to the floor on her knees holding onto the stretcher.  This RN, the NT, and the daughter assisted pt to sit on floor.  Pt lifted with 4+ assist to stretcher. Pt has small bruise to left lower leg.  Denies any other injuries.

## 2023-10-12 NOTE — H&P (Signed)
 History and Physical   [name] WJX:[id number] DOB: 07-18-1934 DOA: 10/12/2023  PCP: Catheryn Cluck, MD   Patient coming from: Home  Chief Complaint: Chest pain  HPI: [name] is a 89 y.o. female with medical history significant of hypertension, hyperlipidemia, diabetes, neuropathy, CKD 4, GERD, CAD, chronic diastolic CHF, dementia, depression, anxiety, breast cancer, obesity, gout, DVT, anemia, spinal stenosis, COPD presenting with chest pain.  Patient reports left-sided chest pain radiating across her chest.  Episode lasted around 15 to 20 minutes.  Somewhat improved with first nitroglycerin  but completely improved after second.  Did receive some aspirin  en route by EMS.  Currently pain-free.  Has history of CAD with multiple prior stenting's.  Denies fevers, chills, shortness of breath, abdominal pain, constipation, diarrhea, nausea, vomiting.  Was recently admitted for hyperglycemia/HHS in the setting of steroid use for temporal arteritis.  ED Course: Vital signs in the ED notable for blood pressure in the 110s-150s systolic.  Lab workup included BMP with BUN 62 and creatinine stable at 2.36, glucose 251, calcium  8.5.  CBC with stable leukocytosis at 15.4, hemoglobin stable at 8.6 from baseline of 9.  Troponin flat at 39, 38.  EKG did have some concerning findings of possible ST elevation however these do appear similar to recent EKG.  Cardiology to be consulted by EDP for consideration of provocative testing.  Review of Systems: As per HPI otherwise all other systems reviewed and are negative.  Past Medical History:  Diagnosis Date   Abdominal pain 01/26/2012   Acute deep vein thrombosis (DVT) of femoral vein of right lower extremity (HCC) 03/26/2022   Acute kidney injury superimposed on chronic kidney disease (HCC) 12/18/2014   Acute upper GI bleed 03/26/2022   Allergy     takes Mucinex  daily as needed   Anemia, unspecified    Anxiety    takes Clonazepam   daily as needed   Anxiety associated with depression 06/08/2017   Arthritis    Breast cancer (HCC)    Breast cancer screening 02/19/2014   Breast mass 10/27/2014   Chronic diastolic CHF (congestive heart failure) (HCC)    takes Furosemide  daily   Chronic pain syndrome 02/06/2015   CKD (chronic kidney disease), stage III (HCC)    Coronary artery disease    a. s/p IMI 2004 tx with BMS to RCA;  b. s/p Promus DES to RCA 2/12 c. abnormal nuc 2016 -> cath with med rx. d. NSTEMI 02/2020  mv CAD with severe ISR in pRCA and m/dRCA lesion both treated with DES.   Debility 08/01/2012   Dementia (HCC)    Depression    takes Cymbalta  daily   Diabetes mellitus    insulin  daily   DOE (dyspnea on exertion) 06/24/2014   Dysphagia, unspecified(787.20) 07/31/2012   Fungal dermatitis 11/13/2007   Qualifier: Diagnosis of  By: Minnette Amato  MD, Ronie Cohen    GERD (gastroesophageal reflux disease)    takes Protonix  daily   Gout, unspecified    Headache    occasionally   History of anemia due to chronic kidney disease 03/26/2022   History of blood clots    History of deep venous thrombosis 01/27/2012   History of pulmonary embolus (PE) 04/22/2014   Hyperkalemia 07/26/2017   Hyperlipidemia    takes Pravastatin  daily   Hypertension    Insomnia    Joint pain    Joint swelling    Morbid obesity (HCC)    Multiple benign nevi 11/15/2016   Myocardial infarction [facility]) 2004  Non-STEMI (non-ST elevated myocardial infarction) (HCC) 02/15/2020   Nonintractable headache 01/07/2008   Qualifier: Diagnosis of   By: Minnette Amato  MD, Ronie Cohen        Numbness    Obstructive sleep apnea    does not wear cpap   Osteoarthritis    Osteoarthritis    Osteoarthrosis, unspecified whether generalized or localized, lower leg    Pain in the chest 12/31/2013   Pain, chronic    Personal history of other diseases of the digestive system 12/03/2006   Qualifier: Diagnosis of  By: Minnette Amato  MD, Ronie Cohen    Polymyalgia  rheumatica [facility])    Presence of stent in right coronary artery    Pulmonary emboli (HCC) 01/2012   felt to need lifelong anticoagulation but Xarelto  dc in 02/2020 due to need for DAPT   Situational depression 06/04/2009   Qualifier: Diagnosis of  By: Minnette Amato  MD, Ronie Cohen    Syncope, vasovagal 07/04/2021   Type 2 diabetes mellitus with hyperlipidemia (HCC) 02/16/2022   Urinary incontinence    takes Linzess  daily   Vaginitis and vulvovaginitis 06/23/2016   Vertigo    hx of;was taking Meclizine  if needed   Wears dentures     Past Surgical History:  Procedure Laterality Date   ABDOMINAL HYSTERECTOMY     partial   APPENDECTOMY     blood clots/legs and lungs  2013   BREAST BIOPSY Left 07/22/2014   BREAST BIOPSY Left 02/10/2013   BREAST LUMPECTOMY Left 11/05/2014   BREAST LUMPECTOMY WITH RADIOACTIVE SEED LOCALIZATION Left 11/05/2014   Procedure: LEFT BREAST LUMPECTOMY WITH RADIOACTIVE SEED LOCALIZATION;  Surgeon: Oza Blumenthal, MD;  Location: [facility] OR;  Service: General;  Laterality: Left;   CARDIAC CATHETERIZATION     COLONOSCOPY     CORONARY ANGIOPLASTY  2   CORONARY STENT INTERVENTION N/A 02/17/2020   Procedure: CORONARY STENT INTERVENTION;  Surgeon: Arleen Lacer, MD;  Location: [facility] INVASIVE CV LAB;  Service: Cardiovascular;  Laterality: N/A;   ESOPHAGOGASTRODUODENOSCOPY (EGD) WITH PROPOFOL  N/A 11/07/2016   Procedure: ESOPHAGOGASTRODUODENOSCOPY (EGD) WITH PROPOFOL ;  Surgeon: Kenney Peacemaker, MD;  Location: [facility] ENDOSCOPY;  Service: Endoscopy;  Laterality: N/A;   ESOPHAGOGASTRODUODENOSCOPY (EGD) WITH PROPOFOL  N/A 01/05/2023   Procedure: ESOPHAGOGASTRODUODENOSCOPY (EGD) WITH PROPOFOL ;  Surgeon: Tobin Forts, MD;  Location: [facility] ENDOSCOPY;  Service: Gastroenterology;  Laterality: N/A;   EXCISION OF SKIN TAG Right 11/05/2014   Procedure: EXCISION OF RIGHT EYELID SKIN TAG;  Surgeon: Oza Blumenthal, MD;  Location: [facility] OR;  Service: General;  Laterality: Right;   EYE SURGERY Bilateral     cataract    GASTRIC BYPASS  1977    reversed in 1979, [facility]   LEFT HEART CATH AND CORONARY ANGIOGRAPHY N/A 02/17/2020   Procedure: LEFT HEART CATH AND CORONARY ANGIOGRAPHY;  Surgeon: Arleen Lacer, MD;  Location: [facility] INVASIVE CV LAB;  Service: Cardiovascular;  Laterality: N/A;   LEFT HEART CATHETERIZATION WITH CORONARY ANGIOGRAM N/A 06/29/2014   Procedure: LEFT HEART CATHETERIZATION WITH CORONARY ANGIOGRAM;  Surgeon: Millicent Ally, MD;  Location: [facility] CATH LAB;  Service: Cardiovascular;  Laterality: N/A;   MEMBRANE PEEL Right 10/23/2018   Procedure: MEMBRANE PEEL;  Surgeon: Shon Downing, MD;  Location: [facility] OR;  Service: Ophthalmology;  Laterality: Right;   MI with stent placement  2004   PARS PLANA VITRECTOMY Right 10/23/2018   Procedure: PARS PLANA VITRECTOMY WITH 25 GAUGE;  Surgeon: Shon Downing, MD;  Location: [facility] OR;  Service: Ophthalmology;  Laterality: Right;  Social History  reports that she has never smoked. She has been exposed to tobacco smoke. She has never used smokeless tobacco. She reports that she does not drink alcohol and does not use drugs.  Allergies  Allergen Reactions   Sulfonamide Derivatives Swelling and Other (See Comments)    Mouth swelling- no resp issues noted   Duloxetine      Headaches   Lokelma  [Sodium Zirconium Cyclosilicate ] Other (See Comments)    Headache, stomach upset   Aricept  [Donepezil  Hcl] Diarrhea, Nausea And Vomiting and Other (See Comments)    GI upset/loose stools   Tramadol  Nausea And Vomiting    Family History  Problem Relation Age of Onset   Breast cancer Mother 30   Heart disease Mother    Throat cancer Father    Hypertension Father    Arthritis Father    Diabetes Father    Arthritis Sister    Obesity Sister    Diabetes Sister    Heart disease Cousin    Colon cancer Neg Hx    Stomach cancer Neg Hx    Esophageal cancer Neg Hx   Reviewed on admission  Prior to Admission medications   Medication Sig Start Date End  Date Taking? Authorizing Provider  albuterol  (PROAIR  HFA) 108 (90 Base) MCG/ACT inhaler Inhale 1-2 puffs into the lungs every 6 (six) hours as needed for wheezing or shortness of breath. 12/19/18  Yes Verma Gobble, NP  amitriptyline  (ELAVIL ) 75 MG tablet TAKE 1 TABLET BY MOUTH AT BEDTIME 09/11/23  Yes Catheryn Cluck, MD  atorvastatin  (LIPITOR ) 80 MG tablet TAKE 1 TABLET BY MOUTH EVERY DAY Patient taking differently: Take 80 mg by mouth at bedtime. 12/04/22  Yes Catheryn Cluck, MD  azelastine  (ASTELIN ) 0.1 % nasal spray Place 2 sprays into both nostrils 2 (two) times daily. 10/09/23  Yes Catheryn Cluck, MD  butalbital -acetaminophen -caffeine  (FIORICET ) 50-325-40 MG tablet Take 1 tablet by mouth every 6 (six) hours as needed for headache. 06/08/23  Yes Krishnan, Gokul, MD  calcitRIOL (ROCALTROL) 0.25 MCG capsule Take 0.25 mcg by mouth daily. 10/02/23  Yes [provider]  carvedilol  (COREG ) 12.5 MG tablet TAKE 1 TABLET BY MOUTH 2 TIMES DAILY 05/17/23  Yes Catheryn Cluck, MD  cetirizine  (ZYRTEC ) 10 MG tablet Take 10 mg by mouth daily as needed for allergies or rhinitis.   Yes [provider]  diclofenac  Sodium (VOLTAREN ) 1 % GEL Apply 2 g topically 4 (four) times daily. Patient taking differently: Apply 2 g topically 4 (four) times daily as needed (pain). 02/23/22  Yes Catheryn Cluck, MD  ELIQUIS  5 MG TABS tablet TAKE 1 TABLET BY MOUTH 2 TIMES DAILY 04/09/23  Yes Catheryn Cluck, MD  febuxostat  (ULORIC ) 40 MG tablet TAKE 1 TABLET BY MOUTH EVERY DAY 02/22/23  Yes Catheryn Cluck, MD  fluticasone  (FLONASE ) 50 MCG/ACT nasal spray Place 2 sprays into both nostrils daily. 10/09/23  Yes Catheryn Cluck, MD  Fluticasone -Umeclidin-Vilant (TRELEGY ELLIPTA ) 100-62.5-25 MCG/ACT AEPB Inhale 1 puff into the lungs daily. 01/10/23 01/05/24 Yes Catheryn Cluck, MD  folic acid  (FOLVITE ) 1 MG tablet TAKE 1 TABLET BY MOUTH EVERY DAY 05/17/23  Yes Catheryn Cluck, MD  glucagon  1 MG injection  Inject 1 mg into the vein once as needed for up to 1 dose. 04/13/22  Yes Catheryn Cluck, MD  glucose 4 GM chewable tablet Chew 1 tablet (4 g total) by mouth as needed for low blood sugar. 04/05/22  Yes Hildy Lowers,  Lea Primmer, MD  hydrOXYzine  (ATARAX ) 25 MG tablet TAKE 1 TABLET BY MOUTH EVERY 8 HOURS AS NEEDED FOR ANXIETY OR ITCHING (NASAL AND THROAT CONGESTION) 06/05/23  Yes Catheryn Cluck, MD  insulin  aspart (NOVOLOG  FLEXPEN) 100 UNIT/ML FlexPen Before each meal 3 times a day, 140-199 - 2 units, 200-250 - 6 units, 251-299 - 8 units,  300-349 - 12 units,  350 or above 14 units. 10/07/23  Yes Cala Castleman, MD  insulin  glargine, 2 Unit Dial , (TOUJEO  MAX SOLOSTAR) 300 UNIT/ML Solostar Pen Inject 22 Units into the skin 2 (two) times daily. 10/07/23  Yes Singh, Prashant K, MD  ipratropium-albuterol  (DUONEB) 0.5-2.5 (3) MG/3ML SOLN Take 3 mLs by nebulization every 6 (six) hours as needed (cough, wheezing, or shortness of breath). 10/09/23 01/07/24 Yes Catheryn Cluck, MD  meclizine  (ANTIVERT ) 25 MG tablet Take 1 tablet (25 mg total) by mouth 2 (two) times daily. 07/20/23  Yes Catheryn Cluck, MD  melatonin 5 MG TABS Take 1 tablet (5 mg total) by mouth at bedtime as needed. 06/08/23  Yes Maylene Spear, MD  memantine  (NAMENDA ) 5 MG tablet TAKE 2 TABLETS BY MOUTH EVERY MORNING and TAKE 1 TABLET EVERY EVENING 10/11/23  Yes Catheryn Cluck, MD  montelukast  (SINGULAIR ) 10 MG tablet Take 1 tablet (10 mg total) by mouth at bedtime. 04/24/22  Yes Catheryn Cluck, MD  nitroGLYCERIN  (NITROSTAT ) 0.4 MG SL tablet Dissolve 1 tablet under the tongue every 5 minutes as needed for chest pain. Max of 3 doses, then 911. 09/20/23  Yes Nahser, Lela Purple, MD  nystatin  (MYCOSTATIN /NYSTOP ) powder Apply 1 Application topically 3 (three) times daily. 03/21/23  Yes Gavin Kast, FNP  ondansetron  (ZOFRAN ) 4 MG tablet Take 1 tablet (4 mg total) by mouth every 8 (eight) hours as needed for nausea or vomiting. 06/29/23  Yes Frankie Israel, MD  pantoprazole  (PROTONIX ) 20 MG tablet TAKE 1 TABLET BY MOUTH ONCE DAILY 10/05/23  Yes Catheryn Cluck, MD  predniSONE  (DELTASONE ) 10 MG tablet Take 4 tablets (40 mg total) by mouth daily with breakfast. 09/25/23  Yes Phebe Brasil, MD  Semaglutide  (RYBELSUS) 7 MG TABS Take 1 tablet (7 mg total) by mouth daily. 10/09/23  Yes Catheryn Cluck, MD  torsemide  (DEMADEX ) 20 MG tablet TAKE 1 TABLET BY MOUTH EVERY DAY MAY TAKE AN EXTRA DOES FOR WEIGHT GAIN OF 3LBs IN 1 DAY OR 5LBS IN 1 WEEK OR FOR WORSENING SWELLING 10/05/23  Yes Catheryn Cluck, MD  TYLENOL  500 MG tablet Take 1,000 mg by mouth every 6 (six) hours as needed for mild pain or headache.   Yes [provider]  TYLENOL  PM EXTRA STRENGTH 500-25 MG TABS tablet Take 2 tablets by mouth at bedtime.   Yes [provider]  Vitamin D , Ergocalciferol , (DRISDOL ) 1.25 MG (50000 UNIT) CAPS capsule TAKE 1 CAPSULE BY MOUTH ONCE WEEKLY ON MONDAY 10/08/23  Yes Frankie Israel, MD  Continuous Glucose Receiver (FREESTYLE LIBRE 3 READER) DEVI Continuous glucose monitor 06/21/23   Shamleffer, Julian Obey, MD  Continuous Glucose Sensor (FREESTYLE LIBRE 14 DAY SENSOR) MISC PLACE 1 DEVICE ON THE SKIN AS DIRECTED EVERY 14 DAYS 07/16/23   Shamleffer, Julian Obey, MD    Physical Exam: Vitals:   10/12/23 0639 10/12/23 0945 10/12/23 1029 10/12/23 1245  BP: (!) 151/74 117/70  (!) 137/51  Pulse: 65 60  77  Resp: 19 16  15   Temp: 98.2 F (36.8 C)  97.6 F (36.4 C)  TempSrc: Oral  Oral   SpO2: 100% 100%  100%    Physical Exam Constitutional:      General: She is not in acute distress.    Appearance: Normal appearance.  HENT:     Head: Normocephalic and atraumatic.     Mouth/Throat:     Mouth: Mucous membranes are moist.     Pharynx: Oropharynx is clear.  Eyes:     Extraocular Movements: Extraocular movements intact.     Pupils: Pupils are equal, round, and reactive to light.  Cardiovascular:     Rate and Rhythm: Normal  rate and regular rhythm.     Pulses: Normal pulses.     Heart sounds: Normal heart sounds.  Pulmonary:     Effort: Pulmonary effort is normal. No respiratory distress.     Breath sounds: Normal breath sounds.  Abdominal:     General: Bowel sounds are normal. There is no distension.     Palpations: Abdomen is soft.     Tenderness: There is no abdominal tenderness.  Musculoskeletal:        General: No swelling or deformity.  Skin:    General: Skin is warm and dry.  Neurological:     General: No focal deficit present.     Mental Status: Mental status is at baseline.     Labs on Admission: I have personally reviewed following labs and imaging studies  CBC: Recent Labs  Lab 10/05/23 2248 10/12/23 0706  WBC 18.7* 15.4*  HGB 8.8* 8.6*  HCT 27.2* 27.8*  MCV 89.8 94.9  PLT 166 195    Basic Metabolic Panel: Recent Labs  Lab 10/06/23 0224 10/07/23 0224 10/12/23 0706  NA 135 133* 136  K 4.3 4.4 4.3  CL 97* 96* 98  CO2 25 26 25   GLUCOSE 256* 308* 251*  BUN 70* 78* 62*  CREATININE 2.44* 2.73* 2.36*  CALCIUM  8.3* 8.2* 8.5*    GFR: Estimated Creatinine Clearance: 23.2 mL/min (A) (by C-G formula based on SCr of 2.36 mg/dL (H)).  Liver Function Tests: No results for input(s): "AST", "ALT", "ALKPHOS", "BILITOT", "PROT", "ALBUMIN " in the last 168 hours.  Urine analysis:    Component Value Date/Time   COLORURINE YELLOW 09/13/2023 0300   APPEARANCEUR HAZY (A) 09/13/2023 0300   LABSPEC 1.008 09/13/2023 0300   PHURINE 5.0 09/13/2023 0300   GLUCOSEU NEGATIVE 09/13/2023 0300   HGBUR NEGATIVE 09/13/2023 0300   BILIRUBINUR neg 10/04/2023 1117   KETONESUR NEGATIVE 09/13/2023 0300   PROTEINUR Positive (A) 10/04/2023 1117   PROTEINUR NEGATIVE 09/13/2023 0300   UROBILINOGEN 0.2 10/04/2023 1117   UROBILINOGEN 0.2 02/16/2018 1514   NITRITE neg 10/04/2023 1117   NITRITE NEGATIVE 09/13/2023 0300   LEUKOCYTESUR Small (1+) (A) 10/04/2023 1117   LEUKOCYTESUR SMALL (A) 09/13/2023  0300    Radiological Exams on Admission: DG Chest Portable 1 View Result Date: 10/12/2023 EXAM: 1 VIEW XRAY OF THE CHEST 10/12/2023 07:34:00 AM COMPARISON: 1-view chest x-ray 12/29/2022 CLINICAL HISTORY: Chest pain. FINDINGS: LUNGS AND PLEURA: Lung volumes are low. No focal airspace disease is present. No pulmonary edema. No pleural effusion. No pneumothorax. HEART AND MEDIASTINUM: No acute abnormality of the cardiac and mediastinal silhouettes. BONES AND SOFT TISSUES: No acute osseous abnormality. IMPRESSION: 1. No acute process. Electronically signed by: Audree Leas MD 10/12/2023 07:41 AM EDT RP Workstation: [id number]   EKG: Independently reviewed.  Sinus rhythm at 64 bpm.  ST elevation in the inferior leads appears to be similar to previous.  Otherwise nonspecific T wave  changes.  Assessment/Plan Principal Problem:   Unstable angina (HCC) Active Problems:   Essential hypertension   Chronic diastolic CHF (congestive heart failure) (HCC)   COPD with chronic bronchitis (HCC)   Dementia (HCC)   Breast cancer (HCC)   GAD (generalized anxiety disorder)   Class 3 obesity   Hyperlipidemia   History of DVT (deep vein thrombosis)   Gout   GERD with esophagitis   Chronic radicular lumbar pain   Spinal stenosis, lumbar region, with neurogenic claudication   Iron  deficiency anemia   Severe episode of recurrent major depressive disorder, without psychotic features (HCC)   CKD (chronic kidney disease), stage IV (HCC)   Type 2 diabetes mellitus with stage 4 chronic kidney disease, with long-term current use of insulin  (HCC)   Diabetic peripheral neuropathy associated with type 2 diabetes mellitus (HCC)   CAD (coronary artery disease)   CAD Unstable angina > Patient with new no chest pain with typical features of substernal radiating across chest improved with nitroglycerin . > History of CAD and prior stenting. > Troponins reassuring at 39, 38.  EKG with some ST elevations but this  appears stable from previous. > Cardiology consulted in the ED awaiting callback. - Appreciate cardiology recommendations and assistance - Echocardiogram - Supportive care - Provocative testing to be ordered per cardiology if they feel this is indicated  Hypertension - Continue home carvedilol , torsemide   Hyperlipidemia - Continue atorvastatin   Diabetes > On modified regimen of 22 units daily and SSI after hyperglycemia on steroids - 22 units BID - SSI  CKD 4 > Creatinine stable at 2.36 - Trend renal function and electrolytes  GERD - Continue home PPI  Chronic diastolic CHF > Last echo in 2023 with EF 55-6%, G1 DD, normal RV function. - Continue home torsemide , carvedilol   Breast cancer > Status post lumpectomy.  Known residual DCIS. Previously on Anastrozole  but no longer taking per Onc note 06/29/23. - Noted   Temporal arteritis - Continue steroids  Dementia - Continue home Namenda   Depression Anxiety - Continue home amitriptyline   Obesity - Noted  Gout - Continue home fluoxetine  Anemia > Hemoglobin stable at 8.6. - Trend CBC  Leukocytosis > Stable in the setting of recent steroid use. - Trend CBC  Chronic pain Spinal stenosis - Noted, pain medication as needed  COPD - Replace home Trelegy with formulary Breztri  - As needed albuterol  - Singulair   DVT prophylaxis: Eliquis  Code Status:   DNR/DNI Family Communication:  Updated at bedside  Disposition Plan:   Patient is from:  Home  Anticipated DC to:  Home  Anticipated DC date:  1 to 3 days  Anticipated DC barriers: None  Consults called:  Cardiology Admission status:  Observation, telemetry  Severity of Illness: The appropriate patient status for this patient is OBSERVATION. Observation status is judged to be reasonable and necessary in order to provide the required intensity of service to ensure the patient's safety. [name] presenting symptoms, physical exam findings, and initial  radiographic and laboratory data in the context of their medical condition is felt to place them at decreased risk for further clinical deterioration. Furthermore, it is anticipated that the patient will be medically stable for discharge from the hospital within 2 midnights of admission.    Johnetta Nab MD [facility]  How to contact the [facility] Attending or Consulting provider 7A - 7P or covering provider during after hours 7P -7A, for this patient?   Check the care team in [facility] and look  for a) attending/consulting [facility] provider listed and b) the [facility] team listed Log into [email or web address] and use [place]'s universal password to access. If you do not have the password, please contact the hospital operator. Locate the [facility] provider you are looking for under [facility] and page to a number that you can be directly reached. If you still have difficulty reaching the provider, please page the [facility] (Director on Call) for the Hospitalists listed on amion for assistance.  10/12/2023, 1:16 PM

## 2023-10-13 ENCOUNTER — Observation Stay (HOSPITAL_COMMUNITY)

## 2023-10-13 LAB — COMPREHENSIVE METABOLIC PANEL WITH GFR
ALT: 22 U/L (ref 0–44)
AST: 20 U/L (ref 15–41)
Albumin: 2.5 g/dL — ABNORMAL LOW (ref 3.5–5.0)
Alkaline Phosphatase: 57 U/L (ref 38–126)
Anion gap: 9 (ref 5–15)
BUN: 62 mg/dL — ABNORMAL HIGH (ref 8–23)
CO2: 26 mmol/L (ref 22–32)
Calcium: 8.4 mg/dL — ABNORMAL LOW (ref 8.9–10.3)
Chloride: 100 mmol/L (ref 98–111)
Creatinine, Ser: 2.25 mg/dL — ABNORMAL HIGH (ref 0.44–1.00)
GFR, Estimated: 21 mL/min — ABNORMAL LOW (ref >60)
Glucose, Bld: 232 mg/dL — ABNORMAL HIGH (ref 70–99)
Potassium: 4.5 mmol/L (ref 3.5–5.1)
Sodium: 135 mmol/L (ref 135–145)
Total Bilirubin: 0.2 mg/dL (ref 0.0–1.2)
Total Protein: 5.3 g/dL — ABNORMAL LOW (ref 6.5–8.1)

## 2023-10-13 LAB — CBG MONITORING, ED: Glucose-Capillary: 200 mg/dL — ABNORMAL HIGH (ref 70–99)

## 2023-10-13 LAB — CBC
HCT: 28.4 % — ABNORMAL LOW (ref 36.0–46.0)
Hemoglobin: 8.6 g/dL — ABNORMAL LOW (ref 12.0–15.0)
MCH: 29 pg (ref 26.0–34.0)
MCHC: 30.3 g/dL (ref 30.0–36.0)
MCV: 95.6 fL (ref 80.0–100.0)
Platelets: 186 10*3/uL (ref 150–400)
RBC: 2.97 MIL/uL — ABNORMAL LOW (ref 3.87–5.11)
RDW: 14.6 % (ref 11.5–15.5)
WBC: 12.1 10*3/uL — ABNORMAL HIGH (ref 4.0–10.5)
nRBC: 0 % (ref 0.0–0.2)

## 2023-10-13 MED ORDER — RANOLAZINE ER 500 MG PO TB12
500.0000 mg | ORAL_TABLET | Freq: Two times a day (BID) | ORAL | 1 refills | Status: DC
Start: 2023-10-13 — End: 2024-02-29

## 2023-10-13 NOTE — Care Management (Signed)
 Transition of Care [facility]) - Inpatient Brief Assessment   Patient Details  Name: [name] MRN: [id number] Date of Birth: 02/24/1935  Transition of Care [facility]) CM/SW Contact:    Ronni Colace, RN Phone Number: 10/13/2023, 8:52 AM   Clinical Narrative: Presented with CP, history of MI, DC from hospital on 6/1 was followed up with CM chronic disease management and community resources support from PCP. Office.  Patient lives with her daughter, no needs identified at this time.  Follow up with PCP  Transition of Care Asessment: Insurance and Status: Insurance coverage has been reviewed Patient has primary care physician: Yes Home environment has been reviewed: Lives with daughterr Prior level of function:: Family assistance Prior/Current Home Services: No current home services Social Drivers of Health Review: SDOH reviewed no interventions necessary Readmission risk has been reviewed: Yes Transition of care needs: no transition of care needs at this time

## 2023-10-15 ENCOUNTER — Telehealth: Payer: Self-pay

## 2023-10-15 ENCOUNTER — Other Ambulatory Visit: Payer: Self-pay

## 2023-10-15 ENCOUNTER — Other Ambulatory Visit (HOSPITAL_COMMUNITY): Payer: Self-pay

## 2023-10-15 NOTE — Progress Notes (Signed)
 10/15/2023 Name: [name] MRN: [id number] DOB: February 15, 1935  Chief Complaint  Patient presents with   Diabetes Management Plan   MADISUN HARGROVE is a 89 y.o. year old female who presented for a telephone visit.   They were referred to the pharmacist by their PCP for assistance in managing diabetes.   Subjective:  Care Team: Primary Care Provider: Catheryn Cluck, MD ; Next Scheduled Visit: 12/11/2023  Medication Access/Adherence  Current Pharmacy:  [facility] [address], [place] - [address] [place] [address] Phone: [phone] Fax: [phone]  [facility] [place], [place] - [address] [place] [place] [address] Phone: [phone] Fax: [phone]  [facility] [place], [place] - [address] [place] [address] Phone: [phone] Fax: [phone]  [facility] [address] [place] [address] Phone: [phone] Fax: [phone]  [facility] [address] [place], [place] - [address] [place] [address] Phone: [phone] Fax: [phone]  -Patient reports affordability concerns with their medications: No  -Patient reports access/transportation concerns to their pharmacy: No  -Patient reports adherence concerns with their medications:  Yes    Diabetes: Current medications: Toujeo  Max 300 unit/mL 22 units BID, Novolog  Flexpen SSI  -Medications tried in the past: Ozempic  caused GI upset and fatigue -Rybelsus  7mg  daily recently prescribed to replace Ozempic  to see if patient is able to tolerate better.  Patient has not yet received this medication. -Using Libre 3 for CGM, but patient's daughter states reader/sensor have not been providing readings today -Endorses BG ranging 200-300 recently -Does not endorse any hypoglycemia -PCP instructed patient to f/u with  endocrinology  Objective:  Lab Results  Component Value Date   HGBA1C 9.4 (H) 09/25/2023   Lab Results  Component Value Date   CREATININE 2.25 (H) 10/13/2023   BUN 62 (H) 10/13/2023   NA 135 10/13/2023   K 4.5 10/13/2023   CL 100 10/13/2023   CO2 26 10/13/2023   Medications Reviewed Today     Reviewed by Linn Rich, RPH (Pharmacist) on 10/15/23 at 1305  Med List Status: <None>   Medication Order Taking? Sig Documenting Provider Last Dose Status Informant  albuterol  (PROAIR  HFA) 108 (90 Base) MCG/ACT inhaler [id number] Yes Inhale 1-2 puffs into the lungs every 6 (six) hours as needed for wheezing or shortness of breath. Verma Gobble, NP Taking Active Family Member, Pharmacy Records           Med Note [facility] [place], [place] A   Fri Oct 05, 2023  4:03 PM)    amitriptyline  (ELAVIL ) 75 MG tablet [id number] Yes TAKE 1 TABLET BY MOUTH AT BEDTIME Catheryn Cluck, MD Taking Active Family Member, Pharmacy Records  atorvastatin  (LIPITOR ) 80 MG tablet [id number] Yes TAKE 1 TABLET BY MOUTH EVERY DAY  Patient taking differently: Take 80 mg by mouth at bedtime.   Catheryn Cluck, MD Taking Active Family Member, Pharmacy Records  azelastine  (ASTELIN ) 0.1 % nasal spray [id number]  Place 2 sprays into both nostrils 2 (two) times daily. Catheryn Cluck, MD  Active Family Member, Pharmacy Records  butalbital -acetaminophen -caffeine  (FIORICET ) 50-325-40 MG tablet [id number]  Take 1 tablet by mouth every 6 (six) hours as needed for headache. Maylene Spear, MD  Active Family Member, Pharmacy Records  calcitRIOL  (ROCALTROL ) 0.25 MCG capsule [id number] Yes Take  0.25 mcg by mouth daily. [provider] Taking Active Family Member, Pharmacy Records  carvedilol  (COREG ) 12.5 MG tablet [id number] Yes TAKE 1 TABLET BY MOUTH 2 TIMES DAILY Catheryn Cluck, MD Taking Active Family Member, Pharmacy Records  cetirizine  (ZYRTEC ) 10 MG tablet [id number]  Take 10 mg by mouth daily as needed for  allergies or rhinitis. [provider]  Active Family Member, Pharmacy Records  Continuous Glucose Receiver (FREESTYLE LIBRE 3 READER) DEVI [id number] Yes Continuous glucose monitor Shamleffer, Julian Obey, MD Taking Active Family Member, Pharmacy Records  Continuous Glucose Sensor (FREESTYLE LIBRE 14 DAY SENSOR) [place] [id number] Yes PLACE 1 DEVICE ON THE SKIN AS DIRECTED EVERY 14 DAYS Shamleffer, Julian Obey, MD Taking Active Family Member, Pharmacy Records  diclofenac  Sodium (VOLTAREN ) 1 % GEL [id number]  Apply 2 g topically 4 (four) times daily.  Patient taking differently: Apply 2 g topically 4 (four) times daily as needed (pain).   Catheryn Cluck, MD  Active Family Member, Pharmacy Records  ELIQUIS  5 MG TABS tablet [id number] Yes TAKE 1 TABLET BY MOUTH 2 TIMES DAILY Catheryn Cluck, MD Taking Active Family Member, Pharmacy Records  febuxostat  (ULORIC ) 40 MG tablet [id number] Yes TAKE 1 TABLET BY MOUTH EVERY DAY Catheryn Cluck, MD Taking Active Family Member, Pharmacy Records  fluticasone  (FLONASE ) 50 MCG/ACT nasal spray [id number] Yes Place 2 sprays into both nostrils daily. Catheryn Cluck, MD Taking Active Family Member, Pharmacy Records  Fluticasone -Umeclidin-Vilant (TRELEGY ELLIPTA ) 100-62.5-25 MCG/ACT AEPB [id number] Yes Inhale 1 puff into the lungs daily. Catheryn Cluck, MD Taking Active Family Member, Pharmacy Records  folic acid  (FOLVITE ) 1 MG tablet [id number] Yes TAKE 1 TABLET BY MOUTH EVERY DAY Catheryn Cluck, MD Taking Active Family Member, Pharmacy Records  glucagon  1 MG injection [id number]  Inject 1 mg into the vein once as needed for up to 1 dose. Catheryn Cluck, MD  Active Family Member, Pharmacy Records           Med Note [facility] [place], [place] A   Fri Oct 05, 2023  4:04 PM)    glucose 4 GM chewable tablet [id number]  Chew 1 tablet (4 g total) by mouth as needed for low blood sugar. Catheryn Cluck, MD  Active Family Member, Pharmacy Records   hydrOXYzine  (ATARAX ) 25 MG tablet [id number]  TAKE 1 TABLET BY MOUTH EVERY 8 HOURS AS NEEDED FOR ANXIETY OR ITCHING (NASAL AND THROAT CONGESTION) Catheryn Cluck, MD  Active Family Member, Pharmacy Records  insulin  aspart (NOVOLOG  FLEXPEN) 100 UNIT/ML FlexPen [id number] Yes Before each meal 3 times a day, 140-199 - 2 units, 200-250 - 6 units, 251-299 - 8 units,  300-349 - 12 units,  350 or above 14 units. Singh, Prashant K, MD Taking Active Family Member, Pharmacy Records  insulin  glargine, 2 Unit Dial , (TOUJEO  MAX SOLOSTAR) 300 UNIT/ML Solostar Pen [id number] Yes Inject 22 Units into the skin 2 (two) times daily. Singh, Prashant K, MD Taking Active Family Member, Pharmacy Records  ipratropium-albuterol  (DUONEB) 0.5-2.5 (3) MG/3ML SOLN [id number] Yes Take 3 mLs by nebulization every 6 (six) hours as needed (cough, wheezing, or shortness of breath). Catheryn Cluck, MD Taking Active Family Member, Pharmacy Records  meclizine  (ANTIVERT ) 25 MG tablet [id number] Yes Take 1 tablet (25 mg total) by mouth 2 (two) times daily. Catheryn Cluck, MD Taking Active Family Member, Pharmacy Records  melatonin 5 MG TABS [id number]  Take 1 tablet (5 mg total) by mouth at bedtime as needed. Krishnan, Gokul, MD  Active Family Member, Pharmacy Records  memantine  (NAMENDA ) 5 MG tablet [id number] Yes TAKE 2 TABLETS BY MOUTH EVERY MORNING and TAKE 1 TABLET EVERY EVENING Catheryn Cluck, MD Taking Active Family Member, Pharmacy Records  montelukast  (SINGULAIR ) 10 MG tablet [id number]  Take 1 tablet (10 mg total) by mouth at bedtime. Catheryn Cluck, MD  Active Family Member, Pharmacy Records           Med Note [facility] [place], [place] A   Fri Oct 05, 2023  4:03 PM)    nitroGLYCERIN  (NITROSTAT ) 0.4 MG SL tablet [id number] Yes Dissolve 1 tablet under the tongue every 5 minutes as needed for chest pain. Max of 3 doses, then 911. Nahser, Lela Purple, MD Taking Active Family Member, Pharmacy Records  nystatin  (MYCOSTATIN /NYSTOP )  powder [id number]  Apply 1 Application topically 3 (three) times daily. Gavin Kast, FNP  Active Family Member, Pharmacy Records  ondansetron  (ZOFRAN ) 4 MG tablet [id number]  Take 1 tablet (4 mg total) by mouth every 8 (eight) hours as needed for nausea or vomiting. Frankie Israel, MD  Active Family Member, Pharmacy Records  pantoprazole  (PROTONIX ) 20 MG tablet [id number] Yes TAKE 1 TABLET BY MOUTH ONCE DAILY Catheryn Cluck, MD Taking Active Family Member, Pharmacy Records  predniSONE  (DELTASONE ) 10 MG tablet [id number] Yes Take 4 tablets (40 mg total) by mouth daily with breakfast. Phebe Brasil, MD Taking Active Family Member, Pharmacy Records  ranolazine  (RANEXA ) 500 MG 12 hr tablet [id number] Yes Take 1 tablet (500 mg total) by mouth 2 (two) times daily. Deforest Fast, MD Taking Active   Semaglutide  (RYBELSUS ) 7 MG TABS [id number] No Take 1 tablet (7 mg total) by mouth daily.  Patient not taking: Reported on 10/15/2023   Catheryn Cluck, MD Not Taking Active Family Member, Pharmacy Records  torsemide  (DEMADEX ) 20 MG tablet [id number] Yes TAKE 1 TABLET BY MOUTH EVERY DAY MAY TAKE AN EXTRA DOES FOR WEIGHT GAIN OF 3LBs IN 1 DAY OR 5LBS IN 1 WEEK OR FOR WORSENING SWELLING Catheryn Cluck, MD Taking Active Family Member, Pharmacy Records  TYLENOL  500 MG tablet [id number]  Take 1,000 mg by mouth every 6 (six) hours as needed for mild pain or headache. [provider]  Active Family Member, Pharmacy Records  TYLENOL  PM EXTRA STRENGTH 500-25 MG TABS tablet [id number]  Take 2 tablets by mouth at bedtime. [provider]  Active Family Member, Pharmacy Records  Vitamin D , Ergocalciferol , (DRISDOL ) 1.25 MG (50000 UNIT) CAPS capsule [id number] Yes TAKE 1 CAPSULE BY MOUTH ONCE WEEKLY ON MONDAY Salomon Cree Orion Birks, MD Taking Active Family Member, Pharmacy Records  Med List Note Malon Seamen, [place] 06/15/22 1332): MR 10/01/22.           Assessment/Plan:   Diabetes: -Currently  uncontrolled -[facility], and they state Rybelsus  is too soon to fill based on a fill at a [facility] 6/5.  This appears to have been a test claim once PA request was approved.  Access team is contacting insurance to have this claim reversed, so [facility] can fill the medication -Goal to titrate Rybelsus  as tolerated and needed for A1c improvement -Prior notes reflect patient not a good candidate for SGLT2 therapy to due risk of genitourinary infecctions -Recommend follow-up A1c after 90 days of Rybelsus  therapy; if not <8%, consider insulin  adjustments  Follow Up Plan: Will notify pharmacy once insurance has reversed Ryelsus claim and follow-up with patient  Linn Rich, PharmD, DPLA

## 2023-10-16 ENCOUNTER — Other Ambulatory Visit (HOSPITAL_COMMUNITY): Payer: Self-pay

## 2023-10-17 ENCOUNTER — Telehealth: Payer: Self-pay

## 2023-10-17 NOTE — Progress Notes (Signed)
   10/17/2023  Patient ID: [name], female   DOB: Jul 20, 1934, 89 y.o.   MRN: [id number]  Pharmacy technician contacted patient's insurance to have test claim for Rybelsus  7mg  done through [facility] reversed, and insurance states this has been completed.  Contacted patient's [facility], and medication still cannot be processed on insurance until 6/13; but the pharmacy will fill the medication then.  Attempted to contact patient/daughter to inform and schedule a follow-up visit, but I was not able to reach them.  HIPAA compliant voicemail left with my direct phone number. If I do not hear back from the patient, I will try to call again next week.  Linn Rich, PharmD, DPLA

## 2023-10-18 ENCOUNTER — Other Ambulatory Visit (HOSPITAL_COMMUNITY): Payer: Self-pay

## 2023-10-18 NOTE — Telephone Encounter (Signed)
 LVM for [name] to call the office and provide some clarification on this request and what is needed from PCP.

## 2023-10-19 ENCOUNTER — Other Ambulatory Visit: Payer: Self-pay | Admitting: Family Medicine

## 2023-10-19 DIAGNOSIS — I5032 Chronic diastolic (congestive) heart failure: Secondary | ICD-10-CM

## 2023-10-19 NOTE — Telephone Encounter (Signed)
 Addressed. Spoke with RN Case Manager this morning 10/19/23. See previous note.  Copied from CRM [id number]. Topic: General - Other >> Oct 17, 2023  2:25 PM Kita Perish H wrote: Reason for CRM: [name]-RN Case Manager [employer] wanting to speak with office regarding some concerns regarding patient and recent stat for observation in hospital.   [name]-RN Case Manager [employer]  [phone]

## 2023-10-19 NOTE — Telephone Encounter (Signed)
 Copied from CRM [id number]. Topic: Clinical - Medication Refill >> Oct 19, 2023  2:52 PM Kita Perish H wrote: Medication: torsemide  (DEMADEX ) 20 MG tablet  Has the patient contacted their pharmacy? Yes, told to call out of town different pharmacy (Agent: If no, request that the patient contact the pharmacy for the refill. If patient does not wish to contact the pharmacy document the reason why and proceed with request.) (Agent: If yes, when and what did the pharmacy advise?)  This is the patient's preferred pharmacy:    [facility] [address] [place], [place] - [address] [place] [address] Phone: [phone] Fax: [phone]  Is this the correct pharmacy for this prescription? Yes If no, delete pharmacy and type the correct one.   Has the prescription been filled recently? No  Is the patient out of the medication? Yes, didn't take medication with her out of town  Has the patient been seen for an appointment in the last year OR does the patient have an upcoming appointment? Yes  Can we respond through MyChart? Yes  Agent: Please be advised that Rx refills may take up to 3 business days. We ask that you follow-up with your pharmacy.

## 2023-10-19 NOTE — Telephone Encounter (Signed)
 Spoke with HTA RN Case Production designer, theatre/television/film. PCP should place a HH order for Custodial Care. This will allow pt to receive extra help in the home.   [name] expressed concerns about the patient's care at home, frequent hospitalizations, lack of resources, etc. Dr. Hildy Lowers, have you observed or discussed anything with the pt that was concerning to you?

## 2023-10-22 NOTE — Progress Notes (Deleted)
 Cardiology Office Note:    Date:  10/22/2023   ID:  [name], DOB 09/29/1934, MRN [id number]  PCP:  Nichole Cluck, MD   [facility] Providers Cardiologist:  Hazle Lites, MD { Click to update primary MD,subspecialty MD or APP then REFRESH:1}    Referring MD: Nichole Cluck, MD   No chief complaint on file. ***  History of Present Illness:    [name] is a 89 y.o. female with a hx of dementia, CAD with extensive PCI to RCA with last intervention in 2021, hyperlipidemia, HFpEF, history of breast cancer, history of PE on Eliquis , polymyalgia rheumatica, OSA, CKD 4, COPD, IDDM, recent temporal arteritis complicated by HHS.  [name] has CAD with prior stenting including BMS-RCA in 2004, DES x 2-mid RCA 2012, recent NSTEMI 02/2020 resulting in DES-RCA.  That angiography showed multivessel CAD but severe ISR in proximal RCA and mid to distal RCA, both treated with DES.  She has diffuse mild to moderate disease in the left main as well as LCx and LAD but nonobstructive and treated medically.  She has a history of prior PE/DVT and was on oral anticoagulation.  Per prior notes, Xarelto  was held given need for DAPT after heart catheterization in 2021, through shared decision making with Dr. Salomon Cree.  She was last seen in clinic 02/2023 with Dr. Alroy Aspen and was wheelchair bound due to knee issues.  She has a history of gastric bypass for morbid obesity.  She is maintained on 40 mg daily Lasix  for HFpEF.  Unfortunately she was seen in the ER 03/2022 with DVT and was started on Eliquis .  [name] presented later that month with melena and chest pain.  VQ scan positive for PE.  She has been continued on apixaban .   She has unfortunately had several recent hospitalizations.  She was seen in the ER 09/05/2023 with general weakness and trouble speaking intermittently x 2 weeks found to be hyperkalemic.  She also reported muscle weakness in her legs and shortness of breath/DOE.  She  reportedly was supposed to be on Lokelma  but ran out of it and did not want to refill it because she did not like the flavor.  She was discharged on Lokelma  without admission.  She was instructed to follow-up closely with nephrology.  Potassium was 5.8.   She was hospitalized 09/12/2023 and diagnosed with temporal arteritis based on clinical features, biopsy not completed, and treated with steroids.  Unfortunately she was rehospitalized 10/05/2023 with hyperglycemia found to have HHS requiring insulin  infusion.  Her insulin  regimen was adjusted with plans to monitor closely with PCP.  Since discharge on 10/07/2023 she has had issues with hyperglycemia.  She is currently on a steroid taper and has had improvements in vision and headaches.  She was recently hospitalized 10/12/2023 with chest pain concerning for angina.  Chest pain resolved with NTG.  Given dementia and CKD 4, chest pain treated medically.  She was started on 500 mg Ranexa  twice daily.   She presents for follow-up today.    Chest pain - Concerning for angina - Recently started on 500 mg Ranexa  twice daily -Given recent headaches and temporal arteritis, I opted to hold off on Imdur    CAD NSTEMI 2021 - Extensive PCI to RCA, ISR in RCA -Mild nonobstructive disease in the left main, LCx, LAD - Focus on risk factor management -Continue 12.5 mg carvedilol  twice daily, 80 mg Lipitor  - No aspirin  in the setting of Eliquis   HFpEF - She takes 20 mg torsemide  daily   History of DVT/PE - Eliquis  5 mg twice daily, no bleeding       Past Medical History:  Diagnosis Date   Abdominal pain 01/26/2012   Acute deep vein thrombosis (DVT) of femoral vein of right lower extremity (HCC) 03/26/2022   Acute kidney injury superimposed on chronic kidney disease (HCC) 12/18/2014   Acute upper GI bleed 03/26/2022   Allergy     takes Mucinex  daily as needed   Anemia, unspecified    Anxiety    takes Clonazepam  daily as needed   Anxiety  associated with depression 06/08/2017   Arthritis    Breast cancer (HCC)    Breast cancer screening 02/19/2014   Breast mass 10/27/2014   Chronic diastolic CHF (congestive heart failure) (HCC)    takes Furosemide  daily   Chronic pain syndrome 02/06/2015   CKD (chronic kidney disease), stage III (HCC)    Coronary artery disease    a. s/p IMI 2004 tx with BMS to RCA;  b. s/p Promus DES to RCA 2/12 c. abnormal nuc 2016 -> cath with med rx. d. NSTEMI 02/2020  mv CAD with severe ISR in pRCA and m/dRCA lesion both treated with DES.   Debility 08/01/2012   Dementia (HCC)    Depression    takes Cymbalta  daily   Diabetes mellitus    insulin  daily   DOE (dyspnea on exertion) 06/24/2014   Dysphagia, unspecified(787.20) 07/31/2012   Fungal dermatitis 11/13/2007   Qualifier: Diagnosis of  By: Minnette Amato  MD, Ronie Cohen    GERD (gastroesophageal reflux disease)    takes Protonix  daily   Gout, unspecified    Headache    occasionally   History of anemia due to chronic kidney disease 03/26/2022   History of blood clots    History of deep venous thrombosis 01/27/2012   History of pulmonary embolus (PE) 04/22/2014   Hyperkalemia 07/26/2017   Hyperlipidemia    takes Pravastatin  daily   Hypertension    Insomnia    Joint pain    Joint swelling    Morbid obesity (HCC)    Multiple benign nevi 11/15/2016   Myocardial infarction [facility]) 2004   Non-STEMI (non-ST elevated myocardial infarction) (HCC) 02/15/2020   Nonintractable headache 01/07/2008   Qualifier: Diagnosis of   By: Minnette Amato  MD, Ronie Cohen        Numbness    Obstructive sleep apnea    does not wear cpap   Osteoarthritis    Osteoarthritis    Osteoarthrosis, unspecified whether generalized or localized, lower leg    Pain in the chest 12/31/2013   Pain, chronic    Personal history of other diseases of the digestive system 12/03/2006   Qualifier: Diagnosis of  By: Minnette Amato  MD, Ronie Cohen    Polymyalgia rheumatica [facility])    Presence of  stent in right coronary artery    Pulmonary emboli (HCC) 01/2012   felt to need lifelong anticoagulation but Xarelto  dc in 02/2020 due to need for DAPT   Situational depression 06/04/2009   Qualifier: Diagnosis of  By: Minnette Amato  MD, Ronie Cohen    Syncope, vasovagal 07/04/2021   Type 2 diabetes mellitus with hyperlipidemia (HCC) 02/16/2022   Urinary incontinence    takes Linzess  daily   Vaginitis and vulvovaginitis 06/23/2016   Vertigo    hx of;was taking Meclizine  if needed   Wears dentures     Past Surgical History:  Procedure Laterality Date   ABDOMINAL HYSTERECTOMY  partial   APPENDECTOMY     blood clots/legs and lungs  2013   BREAST BIOPSY Left 07/22/2014   BREAST BIOPSY Left 02/10/2013   BREAST LUMPECTOMY Left 11/05/2014   BREAST LUMPECTOMY WITH RADIOACTIVE SEED LOCALIZATION Left 11/05/2014   Procedure: LEFT BREAST LUMPECTOMY WITH RADIOACTIVE SEED LOCALIZATION;  Surgeon: Oza Blumenthal, MD;  Location: [facility] OR;  Service: General;  Laterality: Left;   CARDIAC CATHETERIZATION     COLONOSCOPY     CORONARY ANGIOPLASTY  2   CORONARY STENT INTERVENTION N/A 02/17/2020   Procedure: CORONARY STENT INTERVENTION;  Surgeon: Arleen Lacer, MD;  Location: [facility] INVASIVE CV LAB;  Service: Cardiovascular;  Laterality: N/A;   ESOPHAGOGASTRODUODENOSCOPY (EGD) WITH PROPOFOL  N/A 11/07/2016   Procedure: ESOPHAGOGASTRODUODENOSCOPY (EGD) WITH PROPOFOL ;  Surgeon: Kenney Peacemaker, MD;  Location: [facility] ENDOSCOPY;  Service: Endoscopy;  Laterality: N/A;   ESOPHAGOGASTRODUODENOSCOPY (EGD) WITH PROPOFOL  N/A 01/05/2023   Procedure: ESOPHAGOGASTRODUODENOSCOPY (EGD) WITH PROPOFOL ;  Surgeon: Tobin Forts, MD;  Location: [facility] ENDOSCOPY;  Service: Gastroenterology;  Laterality: N/A;   EXCISION OF SKIN TAG Right 11/05/2014   Procedure: EXCISION OF RIGHT EYELID SKIN TAG;  Surgeon: Oza Blumenthal, MD;  Location: [facility] OR;  Service: General;  Laterality: Right;   EYE SURGERY Bilateral    cataract    GASTRIC BYPASS  1977     reversed in 1979, [facility]   LEFT HEART CATH AND CORONARY ANGIOGRAPHY N/A 02/17/2020   Procedure: LEFT HEART CATH AND CORONARY ANGIOGRAPHY;  Surgeon: Arleen Lacer, MD;  Location: [facility] INVASIVE CV LAB;  Service: Cardiovascular;  Laterality: N/A;   LEFT HEART CATHETERIZATION WITH CORONARY ANGIOGRAM N/A 06/29/2014   Procedure: LEFT HEART CATHETERIZATION WITH CORONARY ANGIOGRAM;  Surgeon: Millicent Ally, MD;  Location: [facility] CATH LAB;  Service: Cardiovascular;  Laterality: N/A;   MEMBRANE PEEL Right 10/23/2018   Procedure: MEMBRANE PEEL;  Surgeon: Shon Downing, MD;  Location: [facility] OR;  Service: Ophthalmology;  Laterality: Right;   MI with stent placement  2004   PARS PLANA VITRECTOMY Right 10/23/2018   Procedure: PARS PLANA VITRECTOMY WITH 25 GAUGE;  Surgeon: Shon Downing, MD;  Location: [facility] OR;  Service: Ophthalmology;  Laterality: Right;    Current Medications: No outpatient medications have been marked as taking for the 10/23/23 encounter (Appointment) with Lamond Pilot, PA.     Allergies:   Sulfonamide derivatives, Duloxetine , Lokelma  [sodium zirconium cyclosilicate ], Aricept  [donepezil  hcl], and Tramadol    Social History   Socioeconomic History   Marital status: Widowed    Spouse name: Not on file   Number of children: 3   Years of education: Not on file   Highest education level: High school graduate  Occupational History   Occupation: retired  Tobacco Use   Smoking status: Never    Passive exposure: Past   Smokeless tobacco: Never   Tobacco comments:    Both parents smoked, patient was exposed/ Raised up in smoke, my whole life.  Vaping Use   Vaping status: Never Used  Substance and Sexual Activity   Alcohol use: No    Alcohol/week: 0.0 standard drinks of alcohol   Drug use: No   Sexual activity: Not on file  Other Topics Concern   Not on file  Social History Narrative   Not on file   Social Drivers of Health   Financial Resource Strain: Low Risk   (09/28/2023)   Overall Financial Resource Strain (CARDIA)    Difficulty of Paying Living Expenses: Not hard at all  Food  Insecurity: No Food Insecurity (10/05/2023)   Hunger Vital Sign    Worried About Running Out of Food in the Last Year: Never true    Ran Out of Food in the Last Year: Never true  Transportation Needs: No Transportation Needs (10/05/2023)   PRAPARE - Administrator, Civil Service (Medical): No    Lack of Transportation (Non-Medical): No  Physical Activity: Inactive (09/28/2023)   Exercise Vital Sign    Days of Exercise per Week: 0 days    Minutes of Exercise per Session: 0 min  Stress: Stress Concern Present (09/28/2023)   [employer] of Occupational Health - Occupational Stress Questionnaire    Feeling of Stress : To some extent  Social Connections: Socially Isolated (10/05/2023)   Social Connection and Isolation Panel    Frequency of Communication with Friends and Family: Three times a week    Frequency of Social Gatherings with Friends and Family: More than three times a week    Attends Religious Services: Never    Database administrator or Organizations: No    Attends Banker Meetings: Never    Marital Status: Widowed     Family History: [name] ***family history includes Arthritis in her father and sister; Breast cancer (age of onset: 81) in her mother; Diabetes in her father and sister; Heart disease in her cousin and mother; Hypertension in her father; Obesity in her sister; Throat cancer in her father. There is no history of Colon cancer, Stomach cancer, or Esophageal cancer.  ROS:   Please see the history of present illness.    *** All other systems reviewed and are negative.  EKGs/Labs/Other Studies Reviewed:    The following studies were reviewed today: ***      Recent Labs: 01/03/2023: TSH 0.804 06/08/2023: Magnesium  1.4 10/13/2023: ALT 22; BUN 62; Creatinine, Ser 2.25; Hemoglobin 8.6; Platelets 186; Potassium 4.5;  Sodium 135  Recent Lipid Panel    Component Value Date/Time   CHOL 81 (L) 03/05/2023 1236   TRIG 74 03/05/2023 1236   HDL 31 (L) 03/05/2023 1236   CHOLHDL 2.6 03/05/2023 1236   CHOLHDL 2.7 01/26/2021 1405   VLDL 17 02/15/2020 0636   LDLCALC 34 03/05/2023 1236   LDLCALC 42 01/26/2021 1405     Risk Assessment/Calculations:   {Does this patient have ATRIAL FIBRILLATION?[phone]  No BP recorded.  {Refresh Note OR Click here to enter BP  :1}***         Physical Exam:    VS:  There were no vitals taken for this visit.    Wt Readings from Last 3 Encounters:  10/09/23 268 lb 15.4 oz (122 kg)  10/05/23 259 lb 7.7 oz (117.7 kg)  09/25/23 264 lb 15.9 oz (120.2 kg)     GEN: *** Well nourished, well developed in no acute distress HEENT: Normal NECK: No JVD; No carotid bruits LYMPHATICS: No lymphadenopathy CARDIAC: ***RRR, no murmurs, rubs, gallops RESPIRATORY:  Clear to auscultation without rales, wheezing or rhonchi  ABDOMEN: Soft, non-tender, non-distended MUSCULOSKELETAL:  No edema; No deformity  SKIN: Warm and dry NEUROLOGIC:  Alert and oriented x 3 PSYCHIATRIC:  Normal affect   ASSESSMENT:    No diagnosis found. PLAN:    In order of problems listed above:  ***      {Are you ordering a CV Procedure (e.g. stress test, cath, DCCV, TEE, etc)?   Press F2        :[id number]}    Medication Adjustments/Labs and Tests  Ordered: Current medicines are reviewed at length with the patient today.  Concerns regarding medicines are outlined above.  No orders of the defined types were placed in this encounter.  No orders of the defined types were placed in this encounter.   There are no Patient Instructions on file for this visit.   Signed, Lamond Pilot, [place]  10/22/2023 8:38 AM     [facility]

## 2023-10-23 ENCOUNTER — Ambulatory Visit: Attending: Physician Assistant | Admitting: Physician Assistant

## 2023-10-24 ENCOUNTER — Telehealth: Payer: Self-pay | Admitting: Pharmacy Technician

## 2023-10-24 ENCOUNTER — Other Ambulatory Visit (HOSPITAL_COMMUNITY): Payer: Self-pay

## 2023-10-24 NOTE — Progress Notes (Signed)
   10/24/2023 Name: [name] MRN: [id number] DOB: [birth year]  Patient is appearing on a report for [employer] [place] Metric Diabetes and last engaged with the clinical pharmacist to discuss diabetes on 10/15/2023. Contacted patient today to discuss diabetes management and completed medication review.   Diabetes Plan from last clinical pharmacist appointment:  Diabetes: -Currently uncontrolled -Contacted [facility], and they state Rybelsus  is too soon to fill based on a fill at a [facility] 6/5.  This appears to have been a test claim once PA request was approved.  Access team is contacting insurance to have this claim reversed, so [facility] can fill the medication -Goal to titrate Rybelsus  as tolerated and needed for A1c improvement -Prior notes reflect patient not a good candidate for SGLT2 therapy to due risk of genitourinary infecctions -Recommend follow-up A1c after 90 days of Rybelsus  therapy; if not <8%, consider insulin  adjustments  Follow Up Plan: Will notify pharmacy once insurance has reversed Ryelsus claim and follow-up with patient   Medication Adherence Barriers Identified:  Patient made recommended medication changes per plan: No Patient has not started Rybelsus  due to [facility] getting a rejection at start of month of refill too soon. Programme researcher, broadcasting/film/video pharmacy and spoke to Charmaine. She was able to refill the medication with a successful claim and patient's copay is $0. She informs patient can pick up later today. Outreached patient. Spoke to patient's daughter. HIPAA verified. Informed daughter that patient is to stop Ozempic  and start Rybelsus . Also informed daughter that PharmD would like to f/u with patient 4 weeks after starting medication and that a scheduler would outreach her with an appointment day and time. Patient's daughter verbalized understanding and informed the medication would be picked up tomorrow.  Medication Adherence Barriers Addressed/Actions  Taken:  Reviewed medication changes per plan from last clinical pharmacist note Medication Access for Rybelsus  Will discuss medication access concerns with pharmacist  Contacted pharmacy regarding new prescriptions Educated patient to contact pharmacy regarding new prescriptions   Next clinical pharmacist appointment is scheduled for: TBD ill send in basket message to Care Guide Cayetano Coco to schedule a follow up with pharmacist Keane Passe for approximately 4 weeks.  Shan Valdes, CPhT [facility] Office: [phone] Email: [email or web address]

## 2023-10-26 DIAGNOSIS — J449 Chronic obstructive pulmonary disease, unspecified: Secondary | ICD-10-CM | POA: Diagnosis not present

## 2023-11-01 ENCOUNTER — Other Ambulatory Visit: Payer: Self-pay

## 2023-11-01 ENCOUNTER — Other Ambulatory Visit: Payer: Self-pay | Admitting: Family Medicine

## 2023-11-01 DIAGNOSIS — I5032 Chronic diastolic (congestive) heart failure: Secondary | ICD-10-CM

## 2023-11-01 DIAGNOSIS — D509 Iron deficiency anemia, unspecified: Secondary | ICD-10-CM

## 2023-11-02 ENCOUNTER — Inpatient Hospital Stay: Payer: PPO

## 2023-11-02 ENCOUNTER — Telehealth: Payer: Self-pay | Admitting: Internal Medicine

## 2023-11-02 ENCOUNTER — Telehealth: Payer: Self-pay | Admitting: Family Medicine

## 2023-11-02 ENCOUNTER — Inpatient Hospital Stay: Payer: PPO | Attending: Internal Medicine | Admitting: Hematology

## 2023-11-02 ENCOUNTER — Other Ambulatory Visit: Payer: Self-pay | Admitting: Internal Medicine

## 2023-11-02 NOTE — Telephone Encounter (Signed)
 Copied from CRM [id number]. Topic: Clinical - Medication Refill >> Nov 02, 2023  8:21 AM Macario HERO wrote: Medication: Continuous Glucose Receiver (FREESTYLE LIBRE 3 READER) DEVI [[id number]]  (no longer working)  Has the patient contacted their pharmacy? No (Agent: If no, request that the patient contact the pharmacy for the refill. If patient does not wish to contact the pharmacy document the reason why and proceed with request.) (Agent: If yes, when and what did the pharmacy advise?)  This is the patient's preferred pharmacy:    [facility] [address] [place], [place] - [address] [place] [address] Phone: [phone] Fax: [phone]   Is this the correct pharmacy for this prescription? Yes If no, delete pharmacy and type the correct one.   Has the prescription been filled recently? Yes  Is the patient out of the medication? Yes  Has the patient been seen for an appointment in the last year OR does the patient have an upcoming appointment? Yes  Can we respond through MyChart? No  Agent: Please be advised that Rx refills may take up to 3 business days. We ask that you follow-up with your pharmacy.

## 2023-11-02 NOTE — Telephone Encounter (Signed)
 Patient is calling to say that her FreeStyle Herlene is not working.  Patient states that she does know if it is the reader or the sensor and wants to know what she should do.

## 2023-11-02 NOTE — Telephone Encounter (Signed)
 Lvm for pt to call back regarding having trouble with CGM libre.

## 2023-11-02 NOTE — Telephone Encounter (Signed)
 Requested Prescriptions   Pending Prescriptions Disp Refills   NOVOLOG  FLEXPEN 100 UNIT/ML FlexPen [Pharmacy Med Name: NovoLOG  FlexPen 100 UNIT/ML Subcutaneous Solution Pen-injector] 15 mL 0    Sig: INJECT 14 UNITS SUBCUTANEOUSLY TWICE DAILY WITH A MEAL . DO NOT EXCEED 70 PER 24 HOURS

## 2023-11-05 NOTE — Telephone Encounter (Signed)
 Patient daughter [name] will contact [employer] to see if they will replace the meter since it's not working properly and if they can't she will reach out to insurance. She will callback and let us  know if a new prescription needs to be sent

## 2023-11-06 ENCOUNTER — Telehealth: Payer: Self-pay | Admitting: Hematology

## 2023-11-06 ENCOUNTER — Ambulatory Visit: Payer: Self-pay | Admitting: Internal Medicine

## 2023-11-06 NOTE — Telephone Encounter (Signed)
 Spoke with patient daughter confirming upcoming appointment

## 2023-11-13 ENCOUNTER — Telehealth: Payer: Self-pay | Admitting: Family Medicine

## 2023-11-13 NOTE — Telephone Encounter (Signed)
 Copied from CRM [id number]. Topic: Clinical - Medication Question >> Nov 13, 2023 11:01 AM Macario HERO wrote: Reason for CRM: Patient calling to ask provider if she can take [facility]? Requesting a call back.

## 2023-11-13 NOTE — Telephone Encounter (Signed)
 Virtual appt set to discuss OTC medication Nervive.

## 2023-11-14 ENCOUNTER — Telehealth: Payer: Self-pay

## 2023-11-14 NOTE — Telephone Encounter (Signed)
 Left VM to [employer] for [name] letting her know that we've received the form and will be faxing it back out to office today.     Copied from CRM [id number]. Topic: General - Other >> Nov 14, 2023  9:24 AM Burnard DEL wrote: Reason for CRM: [name] from [employer] called to check and see if the office received order to get patients power wheel chair fixed? She would like a call to let her know that we have or have not received. She faxed order today as well.  Cb#[phone]

## 2023-11-16 NOTE — Telephone Encounter (Signed)
 CLINICAL USE BELOW THIS LINE (use X to signify action taken)  ___ Form received and placed in providers office for signature. __X_ Form completed and faxed to [employer].  ___ Form completed & LVM to notify patient ready for pick up.  ___ Charge sheet and copy of form in front office folder for office supervisor.   Faxed on 11/15/23 by Avelina Finder, CMA.

## 2023-11-19 ENCOUNTER — Ambulatory Visit (HOSPITAL_COMMUNITY)
Admission: RE | Admit: 2023-11-19 | Discharge: 2023-11-19 | Disposition: A | Discharge status: Home / Self Care | Source: Ambulatory Visit | Attending: Neurology | Admitting: Neurology

## 2023-11-19 DIAGNOSIS — M316 Other giant cell arteritis: Secondary | ICD-10-CM | POA: Diagnosis not present

## 2023-11-19 NOTE — Progress Notes (Signed)
 VASCULAR LAB    Temporal artery duplex has been performed.  See CV proc for preliminary results.   Vincenzo Stave, RVT 11/19/2023, 11:03 AM

## 2023-11-21 ENCOUNTER — Telehealth: Payer: Self-pay

## 2023-11-21 NOTE — Progress Notes (Signed)
   11/21/2023  Patient ID: [name], female   DOB: Dec 09, 1934, 89 y.o.   MRN: [id number]  Subjective/Objective Outreach to follow-up with patient's daughter (POA/DPR) on management of diabetes   Diabetes Management -Current medications:  Toujeo  Max 22 units BID, Novolog  SSI with meals -Patient previously using Ozempic , but medication caused fatigue and GI upset, so Rybelsus  7mg  was prescribed.  Patient received this medication approximately one month ago, but daughter states she stopped taking after reading potential side effects.  She is unable to verify if patient experience any adverse side effects or not. -Libre 3 reader is not working correctly, so daughter is currently checking home BG with fingersticks, which hurt [name].  She contacted [employer] and they sent replacement sensors.  Reader was filled this year, so it is unlikely insurance will cover a new one at this time. -Current FBG ranging 80-105  Assessment/Plan  Diabetes Management -Continue current insulin  regimen at this time -Contact [employer] to clarify it is the Reader that is not working properly; if they cannot replace, I can see if Medicare Part B would cover a new one, or if an override can be used to replace under part D -Asked patient's daughter to clarify if [name] was experiencing adverse side effects from Rybelsus  or just had concern with potential side effects  Follow-up:  Tuesday   [name], PharmD, DPLA

## 2023-11-22 ENCOUNTER — Other Ambulatory Visit: Payer: Self-pay | Admitting: Internal Medicine

## 2023-11-23 ENCOUNTER — Emergency Department (HOSPITAL_BASED_OUTPATIENT_CLINIC_OR_DEPARTMENT_OTHER): Admitting: Radiology

## 2023-11-23 ENCOUNTER — Encounter (HOSPITAL_BASED_OUTPATIENT_CLINIC_OR_DEPARTMENT_OTHER): Payer: Self-pay

## 2023-11-23 ENCOUNTER — Emergency Department (HOSPITAL_BASED_OUTPATIENT_CLINIC_OR_DEPARTMENT_OTHER)
Admission: EM | Admit: 2023-11-23 | Discharge: 2023-11-23 | Disposition: A | Discharge status: Home / Self Care | Attending: Emergency Medicine | Admitting: Emergency Medicine

## 2023-11-23 ENCOUNTER — Other Ambulatory Visit: Payer: Self-pay

## 2023-11-23 ENCOUNTER — Ambulatory Visit: Payer: Self-pay

## 2023-11-23 ENCOUNTER — Emergency Department (HOSPITAL_BASED_OUTPATIENT_CLINIC_OR_DEPARTMENT_OTHER)

## 2023-11-23 DIAGNOSIS — M7989 Other specified soft tissue disorders: Secondary | ICD-10-CM | POA: Diagnosis not present

## 2023-11-23 DIAGNOSIS — Z7901 Long term (current) use of anticoagulants: Secondary | ICD-10-CM | POA: Insufficient documentation

## 2023-11-23 DIAGNOSIS — F0390 Unspecified dementia without behavioral disturbance: Secondary | ICD-10-CM | POA: Insufficient documentation

## 2023-11-23 DIAGNOSIS — E8770 Fluid overload, unspecified: Secondary | ICD-10-CM | POA: Insufficient documentation

## 2023-11-23 DIAGNOSIS — I509 Heart failure, unspecified: Secondary | ICD-10-CM | POA: Insufficient documentation

## 2023-11-23 DIAGNOSIS — I5032 Chronic diastolic (congestive) heart failure: Secondary | ICD-10-CM

## 2023-11-23 DIAGNOSIS — I700 Atherosclerosis of aorta: Secondary | ICD-10-CM | POA: Diagnosis not present

## 2023-11-23 DIAGNOSIS — N189 Chronic kidney disease, unspecified: Secondary | ICD-10-CM | POA: Diagnosis not present

## 2023-11-23 DIAGNOSIS — R918 Other nonspecific abnormal finding of lung field: Secondary | ICD-10-CM | POA: Diagnosis not present

## 2023-11-23 DIAGNOSIS — R0989 Other specified symptoms and signs involving the circulatory and respiratory systems: Secondary | ICD-10-CM | POA: Diagnosis not present

## 2023-11-23 DIAGNOSIS — I130 Hypertensive heart and chronic kidney disease with heart failure and stage 1 through stage 4 chronic kidney disease, or unspecified chronic kidney disease: Secondary | ICD-10-CM | POA: Diagnosis not present

## 2023-11-23 DIAGNOSIS — I110 Hypertensive heart disease with heart failure: Secondary | ICD-10-CM | POA: Diagnosis not present

## 2023-11-23 LAB — CBC WITH DIFFERENTIAL/PLATELET
Abs Immature Granulocytes: 0.16 K/uL — ABNORMAL HIGH (ref 0.00–0.07)
Basophils Absolute: 0 K/uL (ref 0.0–0.1)
Basophils Relative: 0 %
Eosinophils Absolute: 0.3 K/uL (ref 0.0–0.5)
Eosinophils Relative: 3 %
HCT: 27.5 % — ABNORMAL LOW (ref 36.0–46.0)
Hemoglobin: 8.4 g/dL — ABNORMAL LOW (ref 12.0–15.0)
Immature Granulocytes: 1 %
Lymphocytes Relative: 29 %
Lymphs Abs: 3.3 K/uL (ref 0.7–4.0)
MCH: 29.6 pg (ref 26.0–34.0)
MCHC: 30.5 g/dL (ref 30.0–36.0)
MCV: 96.8 fL (ref 80.0–100.0)
Monocytes Absolute: 0.8 K/uL (ref 0.1–1.0)
Monocytes Relative: 7 %
Neutro Abs: 7 K/uL (ref 1.7–7.7)
Neutrophils Relative %: 60 %
Platelets: 301 K/uL (ref 150–400)
RBC: 2.84 MIL/uL — ABNORMAL LOW (ref 3.87–5.11)
RDW: 15.9 % — ABNORMAL HIGH (ref 11.5–15.5)
WBC: 11.6 K/uL — ABNORMAL HIGH (ref 4.0–10.5)
nRBC: 0.2 % (ref 0.0–0.2)

## 2023-11-23 LAB — COMPREHENSIVE METABOLIC PANEL WITH GFR
ALT: 9 U/L (ref 0–44)
AST: 18 U/L (ref 15–41)
Albumin: 3.5 g/dL (ref 3.5–5.0)
Alkaline Phosphatase: 94 U/L (ref 38–126)
Anion gap: 13 (ref 5–15)
BUN: 60 mg/dL — ABNORMAL HIGH (ref 8–23)
CO2: 25 mmol/L (ref 22–32)
Calcium: 8.7 mg/dL — ABNORMAL LOW (ref 8.9–10.3)
Chloride: 103 mmol/L (ref 98–111)
Creatinine, Ser: 2.63 mg/dL — ABNORMAL HIGH (ref 0.44–1.00)
GFR, Estimated: 17 mL/min — ABNORMAL LOW (ref >60)
Glucose, Bld: 77 mg/dL (ref 70–99)
Potassium: 4.5 mmol/L (ref 3.5–5.1)
Sodium: 141 mmol/L (ref 135–145)
Total Bilirubin: 0.3 mg/dL (ref 0.0–1.2)
Total Protein: 7.3 g/dL (ref 6.5–8.1)

## 2023-11-23 LAB — PRO BRAIN NATRIURETIC PEPTIDE: Pro Brain Natriuretic Peptide: 688 pg/mL — ABNORMAL HIGH (ref <300.0)

## 2023-11-23 LAB — TROPONIN T, HIGH SENSITIVITY
Troponin T High Sensitivity: 61 ng/L — ABNORMAL HIGH (ref <19)
Troponin T High Sensitivity: 59 ng/L — ABNORMAL HIGH (ref <19)

## 2023-11-23 MED ORDER — TORSEMIDE 20 MG PO TABS
ORAL_TABLET | ORAL | 1 refills | Status: DC
Start: 2023-11-23 — End: 2023-12-12

## 2023-11-23 MED ORDER — FUROSEMIDE 10 MG/ML IJ SOLN
60.0000 mg | Freq: Once | INTRAMUSCULAR | Status: AC
  Administered 2023-11-23: 60 mg via INTRAVENOUS
  Filled 2023-11-23: qty 6

## 2023-11-23 NOTE — ED Notes (Signed)
 Dc instructions given, PIV removed. Pt verbalized understanding. Pt out of ER via personal WC with daughter. All belongings and paperwork with pt.

## 2023-11-23 NOTE — Telephone Encounter (Signed)
 FYI Only or Action Required?: FYI only for provider.  Patient was last seen in primary care on 10/09/2023 by Nichole Beverley NOVAK, MD.  Called Nurse Triage reporting Leg Swelling.  Symptoms began several days ago.  Interventions attempted: Prescription medications: torsemide  .  Symptoms are: gradually worsening.  Triage Disposition: Go to ED Now (Notify PCP)  Patient/caregiver understands and will follow disposition?: Yes           Copied from CRM [id number]. Topic: Clinical - Red Word Triage >> Nov 23, 2023  3:28 PM Mercedes MATSU wrote: Red Word that prompted transfer to Nurse Triage: Patient calling in with swelling of her feet and both her legs, patient denies any other symptoms. Reason for Disposition  Difficulty breathing at rest  Answer Assessment - Initial Assessment Questions 1. ONSET: When did the swelling start? (e.g., minutes, hours, days)     3 days 2. LOCATION: What part of the leg is swollen?  Are both legs swollen or just one leg?     Both  3. SEVERITY: How bad is the swelling? (e.g., localized; mild, moderate, severe)     severe 4. REDNESS: Is there redness or signs of infection?     Little red 5. PAIN: Is the swelling painful to touch? If Yes, ask: How painful is it?   (Scale 1-10; mild, moderate or severe)     9/10 6. FEVER: Do you have a fever? If Yes, ask: What is it, how was it measured, and when did it start?      no 7. CAUSE: What do you think is causing the leg swelling?     fluid 8. MEDICAL HISTORY: Do you have a history of blood clots (e.g., DVT), cancer, heart failure, kidney disease, or liver failure?     chf 9. RECURRENT SYMPTOM: Have you had leg swelling before? If Yes, ask: When was the last time? What happened that time?     no 10. OTHER SYMPTOMS: Do you have any other symptoms? (e.g., chest pain, difficulty breathing)       Sob and pain  Protocols used: Leg Swelling and Edema-A-AH

## 2023-11-23 NOTE — ED Notes (Signed)
 Pt assisted to bedside commode to void. Tolerated well. Pt requesting to stay on commode d/t Lasix  administration. Pt informed to use call light when ready to go back in bed. Verbalized understanding. Daughter at bedside for support.

## 2023-11-23 NOTE — ED Triage Notes (Signed)
 Pt c/o real bad swelling in BLE x1wk, itching.  Reports noncompliance w diuretic cause it wasn't doing anything for me, advises taking pill 2x yesterday. Reports some SHOB associated

## 2023-11-23 NOTE — ED Provider Notes (Signed)
 [facility] AT [facility] Provider Note   CSN: [id number] Arrival date & time: 11/23/23  1649     Patient presents with: Leg Swelling   [name] is a 89 y.o. female.   Patient states increase in leg swelling last week.   She does report some shortness of breath.  History of PE CAD hypercholesterol dementia CKD.  Supposed to be taking torsemide .  Is on Eliquis .  Sounds like she has been compliant with her medications after talking with her family member on the phone.  Has doubled her dose of torsemide  here recently.  The history is provided by the patient.       Prior to Admission medications   Medication Sig Start Date End Date Taking? Authorizing Provider  albuterol  (PROAIR  HFA) 108 (90 Base) MCG/ACT inhaler Inhale 1-2 puffs into the lungs every 6 (six) hours as needed for wheezing or shortness of breath. 12/19/18   Caro Harlene POUR, NP  amitriptyline  (ELAVIL ) 75 MG tablet TAKE 1 TABLET BY MOUTH AT BEDTIME 09/11/23   Sebastian Beverley NOVAK, MD  atorvastatin  (LIPITOR ) 80 MG tablet TAKE 1 TABLET BY MOUTH EVERY DAY 11/01/23   Sebastian Beverley NOVAK, MD  azelastine  (ASTELIN ) 0.1 % nasal spray Place 2 sprays into both nostrils 2 (two) times daily. 10/09/23   Sebastian Beverley NOVAK, MD  butalbital -acetaminophen -caffeine  (FIORICET ) 50-325-40 MG tablet Take 1 tablet by mouth every 6 (six) hours as needed for headache. 06/08/23   Verdene Purchase, MD  calcitRIOL  (ROCALTROL ) 0.25 MCG capsule Take 0.25 mcg by mouth daily. 10/02/23   [provider]  carvedilol  (COREG ) 12.5 MG tablet TAKE 1 TABLET BY MOUTH 2 TIMES DAILY 11/01/23   Sebastian Beverley NOVAK, MD  cetirizine  (ZYRTEC ) 10 MG tablet Take 10 mg by mouth daily as needed for allergies or rhinitis.    [provider]  Continuous Glucose Receiver (FREESTYLE LIBRE 3 READER) DEVI Continuous glucose monitor Patient not taking: Reported on 11/21/2023 06/21/23   Shamleffer, Ibtehal Jaralla, MD  Continuous Glucose Sensor  (FREESTYLE LIBRE 14 DAY SENSOR) MISC PLACE 1 DEVICE ON THE SKIN AS DIRECTED EVERY 14 DAYS Patient not taking: Reported on 11/21/2023 07/16/23   Shamleffer, Donell Cardinal, MD  diclofenac  Sodium (VOLTAREN ) 1 % GEL Apply 2 g topically 4 (four) times daily. Patient taking differently: Apply 2 g topically 4 (four) times daily as needed (pain). 02/23/22   Sebastian Beverley NOVAK, MD  ELIQUIS  5 MG TABS tablet TAKE 1 TABLET BY MOUTH 2 TIMES DAILY 04/09/23   Sebastian Beverley NOVAK, MD  febuxostat  (ULORIC ) 40 MG tablet TAKE 1 TABLET BY MOUTH EVERY DAY 02/22/23   Sebastian Beverley NOVAK, MD  fluticasone  (FLONASE ) 50 MCG/ACT nasal spray Place 2 sprays into both nostrils daily. 10/09/23   Sebastian Beverley NOVAK, MD  Fluticasone -Umeclidin-Vilant (TRELEGY ELLIPTA ) 100-62.5-25 MCG/ACT AEPB Inhale 1 puff into the lungs daily. 01/10/23 01/05/24  Sebastian Beverley NOVAK, MD  folic acid  (FOLVITE ) 1 MG tablet TAKE 1 TABLET BY MOUTH EVERY DAY 11/01/23   Sebastian Beverley NOVAK, MD  glucagon  1 MG injection Inject 1 mg into the vein once as needed for up to 1 dose. 04/13/22   Sebastian Beverley NOVAK, MD  glucose 4 GM chewable tablet Chew 1 tablet (4 g total) by mouth as needed for low blood sugar. 04/05/22   Sebastian Beverley NOVAK, MD  hydrOXYzine  (ATARAX ) 25 MG tablet TAKE 1 TABLET BY MOUTH EVERY 8 HOURS AS NEEDED FOR ANXIETY OR ITCHING (NASAL AND THROAT CONGESTION) 06/05/23   Sebastian,  Beverley NOVAK, MD  insulin  glargine, 2 Unit Dial , (TOUJEO  MAX SOLOSTAR) 300 UNIT/ML Solostar Pen Inject 28 units subcutaneously one daily 11/22/23   Shamleffer, Ibtehal Jaralla, MD  ipratropium-albuterol  (DUONEB) 0.5-2.5 (3) MG/3ML SOLN Take 3 mLs by nebulization every 6 (six) hours as needed (cough, wheezing, or shortness of breath). 10/09/23 01/07/24  Sebastian Beverley NOVAK, MD  meclizine  (ANTIVERT ) 25 MG tablet Take 1 tablet (25 mg total) by mouth 2 (two) times daily. 07/20/23   Sebastian Beverley NOVAK, MD  melatonin 5 MG TABS Take 1 tablet (5 mg total) by mouth at bedtime as needed. 06/08/23   Krishnan, Gokul, MD   memantine  (NAMENDA ) 5 MG tablet TAKE 2 TABLETS BY MOUTH EVERY MORNING and TAKE 1 TABLET EVERY EVENING 10/11/23   Sebastian Beverley NOVAK, MD  montelukast  (SINGULAIR ) 10 MG tablet Take 1 tablet (10 mg total) by mouth at bedtime. 04/24/22   Sebastian Beverley NOVAK, MD  nitroGLYCERIN  (NITROSTAT ) 0.4 MG SL tablet Dissolve 1 tablet under the tongue every 5 minutes as needed for chest pain. Max of 3 doses, then 911. 09/20/23   Nahser, Aleene PARAS, MD  NOVOLOG  FLEXPEN 100 UNIT/ML FlexPen INJECT 14 UNITS SUBCUTANEOUSLY TWICE DAILY WITH A MEAL . DO NOT EXCEED 70 PER 24 HOURS Patient taking differently: Sliding Scale before meals:140-199 2 units, 200-250 6, 251-299 8, 300-349 12, 350+ 14 11/02/23   Shamleffer, Ibtehal Jaralla, MD  nystatin  (MYCOSTATIN /NYSTOP ) powder Apply 1 Application topically 3 (three) times daily. 03/21/23   Billy Knee, FNP  ondansetron  (ZOFRAN ) 4 MG tablet Take 1 tablet (4 mg total) by mouth every 8 (eight) hours as needed for nausea or vomiting. 06/29/23   Onesimo Emaline Brink, MD  pantoprazole  (PROTONIX ) 20 MG tablet TAKE 1 TABLET BY MOUTH ONCE DAILY 10/05/23   Sebastian Beverley NOVAK, MD  predniSONE  (DELTASONE ) 10 MG tablet Take 4 tablets (40 mg total) by mouth daily with breakfast. 09/25/23   Onita Duos, MD  ranolazine  (RANEXA ) 500 MG 12 hr tablet Take 1 tablet (500 mg total) by mouth 2 (two) times daily. 10/13/23   Fairy Frames, MD  Semaglutide  (RYBELSUS ) 7 MG TABS Take 1 tablet (7 mg total) by mouth daily. Patient not taking: Reported on 11/21/2023 10/09/23   Sebastian Beverley NOVAK, MD  torsemide  (DEMADEX ) 20 MG tablet TAKE 1 TABLET BY MOUTH EVERY DAY MAY TAKE AN EXTRA DOES FOR WEIGHT GAIN OF 3LBs IN 1 DAY OR 5LBS IN 1 WEEK OR FOR WORSENING SWELLING 11/23/23   Elchanan Bob, DO  TYLENOL  500 MG tablet Take 1,000 mg by mouth every 6 (six) hours as needed for mild pain or headache.    [provider]  TYLENOL  PM EXTRA STRENGTH 500-25 MG TABS tablet Take 2 tablets by mouth at bedtime.    [provider]  Vitamin D , Ergocalciferol , (DRISDOL ) 1.25 MG (50000 UNIT) CAPS capsule TAKE 1 CAPSULE BY MOUTH ONCE WEEKLY ON MONDAY 10/08/23   Kale, Gautam Kishore, MD    Allergies: Sulfonamide derivatives, Duloxetine , Lokelma  [sodium zirconium cyclosilicate ], Aricept  [donepezil  hcl], and Tramadol     Review of Systems  Updated Vital Signs BP (!) 139/54   Pulse 70   Temp (!) 96.2 F (35.7 C)   Resp 14   SpO2 96%   Physical Exam Vitals and nursing note reviewed.  Constitutional:      General: She is not in acute distress.    Appearance: She is well-developed.  HENT:     Head: Normocephalic and atraumatic.     Nose: Nose normal.  Mouth/Throat:     Mouth: Mucous membranes are moist.  Eyes:     Extraocular Movements: Extraocular movements intact.     Conjunctiva/sclera: Conjunctivae normal.     Pupils: Pupils are equal, round, and reactive to light.  Cardiovascular:     Rate and Rhythm: Normal rate and regular rhythm.     Pulses: Normal pulses.     Heart sounds: Normal heart sounds. No murmur heard. Pulmonary:     Effort: Pulmonary effort is normal. No respiratory distress.     Breath sounds: Normal breath sounds.  Abdominal:     General: Abdomen is flat.     Palpations: Abdomen is soft.     Tenderness: There is no abdominal tenderness.  Musculoskeletal:        General: No swelling.     Cervical back: Normal range of motion and neck supple.     Right lower leg: Edema present.     Left lower leg: Edema present.     Comments: Right leg more swollen than the left somewhat chronic for patient  Skin:    General: Skin is warm and dry.     Capillary Refill: Capillary refill takes less than 2 seconds.  Neurological:     General: No focal deficit present.     Mental Status: She is alert.  Psychiatric:        Mood and Affect: Mood normal.     (all labs ordered are listed, but only abnormal results are displayed) Labs Reviewed  COMPREHENSIVE METABOLIC PANEL WITH GFR - Abnormal;  Notable for the following components:      Result Value   BUN 60 (*)    Creatinine, Ser 2.63 (*)    Calcium  8.7 (*)    GFR, Estimated 17 (*)    All other components within normal limits  CBC WITH DIFFERENTIAL/PLATELET - Abnormal; Notable for the following components:   WBC 11.6 (*)    RBC 2.84 (*)    Hemoglobin 8.4 (*)    HCT 27.5 (*)    RDW 15.9 (*)    Abs Immature Granulocytes 0.16 (*)    All other components within normal limits  PRO BRAIN NATRIURETIC PEPTIDE - Abnormal; Notable for the following components:   Pro Brain Natriuretic Peptide 688.0 (*)    All other components within normal limits  TROPONIN T, HIGH SENSITIVITY - Abnormal; Notable for the following components:   Troponin T High Sensitivity 61 (*)    All other components within normal limits  TROPONIN T, HIGH SENSITIVITY - Abnormal; Notable for the following components:   Troponin T High Sensitivity 59 (*)    All other components within normal limits    EKG: None  Radiology: US  Venous Img Lower Right (DVT Study) Result Date: 11/23/2023 CLINICAL DATA:  Right lower leg swelling x1 week. EXAM: RIGHT LOWER EXTREMITY VENOUS DOPPLER ULTRASOUND TECHNIQUE: Gray-scale sonography with graded compression, as well as color Doppler and duplex ultrasound were performed to evaluate the lower extremity deep venous systems from the level of the common femoral vein and including the common femoral, femoral, profunda femoral, popliteal and calf veins including the posterior tibial, peroneal and gastrocnemius veins when visible. The superficial great saphenous vein was also interrogated. Spectral Doppler was utilized to evaluate flow at rest and with distal augmentation maneuvers in the common femoral, femoral and popliteal veins. COMPARISON:  None Available. FINDINGS: Contralateral Common Femoral Vein: Respiratory phasicity is normal and symmetric with the symptomatic side. No evidence of thrombus. Normal compressibility. Common  Femoral  Vein: No evidence of thrombus. Normal compressibility, respiratory phasicity and response to augmentation. Saphenofemoral Junction: No evidence of thrombus. Normal compressibility and flow on color Doppler imaging. Profunda Femoral Vein: No evidence of thrombus. Normal compressibility and flow on color Doppler imaging. Femoral Vein: The RIGHT femoral vein is limited in visualization. Popliteal Vein: No evidence of thrombus. Normal compressibility, respiratory phasicity and response to augmentation. Calf Veins: The RIGHT posterior tibial vein is limited in visualization, while the RIGHT peroneal vein is not visualized. Superficial Great Saphenous Vein: No evidence of thrombus. Normal compressibility. Venous Reflux:  None. Other Findings: The study is technically limited secondary to the patient's body habitus and shadowing from arterial calcifications, as per the ultrasound technologist. IMPRESSION: Limited evaluation of the RIGHT femoral vein, RIGHT posterior tibial vein and RIGHT peroneal vein, without evidence of DVT within the RIGHT extremity. Electronically Signed   By: Suzen Dials M.D.   On: 11/23/2023 19:29   DG Chest Port 1 View Result Date: 11/23/2023 CLINICAL DATA:  [id number] Leg swelling [id number] EXAM: PORTABLE CHEST - 1 VIEW COMPARISON:  None available. FINDINGS: Low lung volumes. Bilateral perihilar interstitial opacities. No focal airspace consolidation or pneumothorax. No cardiomegaly. Tortuous aorta with aortic atherosclerosis. No acute fracture or destructive lesions. Multilevel thoracic osteophytosis. IMPRESSION: Low lung volumes. Bilateral perihilar interstitial opacities, which may reflect bronchovascular crowding, interstitial edema, or atypical/viral infection. Electronically Signed   By: Rogelia Myers M.D.   On: 11/23/2023 17:39     Procedures   Medications Ordered in the ED  furosemide  (LASIX ) injection 60 mg (60 mg Intravenous Given 11/23/23 1945)                                     Medical Decision Making Amount and/or Complexity of Data Reviewed Labs: ordered. Radiology: ordered.  Risk Prescription drug management.   [name] is here with shortness of breath leg swelling.  Normal vitals.  No fever.  Looks volume overloaded on exam.  Differential diagnosis likely volume overload from heart failure but will valuate for worsening renal failure, electrolyte abnormality.  Chest x-ray CBC BMP BNP troponin EKG ordered.  Does report compliance with her diuretics despite triage report saying she is not.  She sounds like she is doubled her dose yesterday.  She does have some increased welling to the right leg compared to the left we will get a DVT study to further evaluate.  She is anticoagulated.  BNP mildly elevated in the 600 range.  Troponin stable 60 x 2.  Overall nonischemic looking EKG.  She is not having any chest pain.  Doubt any cardiac ischemia.  I do suspect some mild volume overload.  Lab work otherwise at baseline.  DVT study unremarkable.  She had good response to a dose of IV Lasix  here.  Will have her take torsemide  twice a day for the next several days and then 20 mg daily until follow-up with her primary care doctor.  She is on room air.  She does not have any respiratory distress.  She uses a wheelchair at all times.  Family felt comfortable diuresing her at home which I think was reasonable.  Understands return precautions.  Discharge.  This chart was dictated using voice recognition software.  Despite best efforts to proofread,  errors can occur which can change the documentation meaning.      Final diagnoses:  Hypervolemia, unspecified hypervolemia type  Congestive heart failure, unspecified HF chronicity, unspecified heart failure type [facility])    ED Discharge Orders          Ordered    torsemide  (DEMADEX ) 20 MG tablet        11/23/23 2011               Ruthe Cornet, DO 11/23/23 2012

## 2023-11-23 NOTE — Discharge Instructions (Signed)
 Recommend torsemide  20 mg twice a day for the next 3 days and then 20 mg daily until you follow-up with your primary care doctor.  Turn if symptoms worsen.

## 2023-11-23 NOTE — ED Notes (Addendum)
 Friday July 18 th 2025  At approximately 5:00 PM on November 23, 2023, the I was requested by the triage nurse to call patient [name] from the waiting area. 2.  Initial Patient Identification: Patient [name] did not verbally respond when her last name was called. I approached the patient directly and requested her registration label to confirm identity. Visual confirmation of the patient's identity was completed using the registration label. 3.  Triage Transfer: After confirming identity, the patient was escorted to the triage room for standard vital sign assessment. 4.  Temperature Measurement Discussion: During preparation for vital signs, the patient questioned whether a probe cover had been applied to the oral thermometer. The triage nurse verbally confirmed that probe covers are always used prior to oral temperature measurement. The patient verbally indicated discomfort with the oral method. I offered an axillary temperature measurement as an alternative, which the patient accepted. The temperature was subsequently obtained using an infrared thermometer . 5.  Blood Draw Preparation: Following vital sign acquisition, the triage nurse directed me to collect blood work from the patient. While preparing for the blood draw, the patient questioned my ability to perform the procedure. 6.  Alternative Offered: I informed the patient that if she was uncomfortable, another technician or nurse could perform the blood draw. 7.  Escalation: I exited the room and informed the triage nurse of the patient's expressed discomfort regarding the blood draw and the offer of an alternative staff member. 8.  Nurse-Patient Discussion: The triage nurse entered the patient's room to speak with her. 9.  Nurse's Request & Response: The triage nurse subsequently requested me to return to the room to perform the blood draw. I gently declined to perform the blood draw at that time, stating to the nurse that the patient  had previously indicated discomfort with the me performing the procedure and reiterating the prior offer to have another staff member perform it.  Actions Taken: Patient identity was confirmed via registration label.   Patient preference regarding temperature measurement method was accommodated (Infrared method used). Patient's discomfort with the me performing the blood draw was acknowledged.   An alternative staff member was offered to the patient to perform the blood draw.   The triage nurse was promptly informed of the patient's concerns. I declined to perform the blood draw after the nurse's request, citing the patient's previously expressed discomfort and the prior offer of an alternative.  Report Prepared By: Raoul Annye Adam  Nurse Technician  Date Prepared: November 23, 2023  at 637 pm.

## 2023-11-25 DIAGNOSIS — J449 Chronic obstructive pulmonary disease, unspecified: Secondary | ICD-10-CM | POA: Diagnosis not present

## 2023-11-25 NOTE — Progress Notes (Signed)
 Hematology/Oncology Clinic Followup Note  Date of service: 11/26/2023   Patient Care Team: Sebastian Beverley NOVAK, MD as PCP - General (Family Medicine) Mona Vinie BROCKS, MD as PCP - Cardiology (Cardiology) Edith, Debby BROCKS, MD as Consulting Physician (Cardiology) Octavia Charleston, MD as Consulting Physician (Ophthalmology) Vernetta Berg, MD as Consulting Physician (General Surgery) Okey Arch, MD (Inactive) as Referring Physician (Obstetrics and Gynecology) Elner Arley LABOR, MD as Consulting Physician (Ophthalmology) Shamleffer, Donell Cardinal, MD as Consulting Physician (Endocrinology) Onesimo Emaline Brink, MD as Consulting Physician (Hematology) Skeet Juliene SAUNDERS, DO as Consulting Physician (Neurology) Hobart Powell BRAVO, MD (Inactive) as Consulting Physician (Cardiology) Lonzell Planas, RN as VBCI Care Management (General Surgery)  CHIEF COMPLAINTS/PURPOSE OF CONSULTATION:  Follow-up for continued evaluation and management of breast cancer  DIAGNOSIS: left-sided presumed stage IA  (pT1c,pNx[cN0], cM0) invasive ductal carcinoma grade 2 out of 3, strongly ER +100%, strongly PR +100% and HER-2/neu negative. Given her high risk cardiac status lumpectomy had to be done under local anesthesia and sentinel lymph node biopsy was not possible. She has accompanying DCIS of intermediate grade present at margins. ECOG performance status is 3. -Had a mammogram/tomosynthesis done on 07/29/2015 which shows residual suspicious calcification in the left breast postero-medial to the biopsy site. Has known residual DCIS.  No evidence of new malignancy.  No evidence of right breast malignancy.   CURRENT TREATMENT: Arimidex  1mg  po daily  HISTORY OF PRESENTING ILLNESS: -please see my initial consultation for details of initial presentation   INTERVAL HISTORY:  [name] is a 89 y.o. female here for follow-up of her breast cancer and anemia.  She was last seen by me on 06/29/2023 and reported worsening  back pain/tenderness beginning in the lower pelvis area, bilateral hearing loss, one fall prior to Christmas causing her to bump her head, plenty of arthritis in the neck, lightheadedness/dizziness, and burning on the side of  her leg from neuropathy.   Patient presented to the ED on 11/23/2023 with hypervolemia.    Patient has missed a couple of doses of Retacrit .due to other medical issues. No new breast symptoms. No palpable lumps or breast discomfort. Labs done today reviewed with patient.   MEDICAL HISTORY:  Past Medical History:  Diagnosis Date   Abdominal pain 01/26/2012   Acute deep vein thrombosis (DVT) of femoral vein of right lower extremity (HCC) 03/26/2022   Acute kidney injury superimposed on chronic kidney disease (HCC) 12/18/2014   Acute upper GI bleed 03/26/2022   Allergy     takes Mucinex  daily as needed   Anemia, unspecified    Anxiety    takes Clonazepam  daily as needed   Anxiety associated with depression 06/08/2017   Arthritis    Breast cancer (HCC)    Breast cancer screening 02/19/2014   Breast mass 10/27/2014   Chronic diastolic CHF (congestive heart failure) (HCC)    takes Furosemide  daily   Chronic pain syndrome 02/06/2015   CKD (chronic kidney disease), stage III (HCC)    Coronary artery disease    a. s/p IMI 2004 tx with BMS to RCA;  b. s/p Promus DES to RCA 2/12 c. abnormal nuc 2016 -> cath with med rx. d. NSTEMI 02/2020  mv CAD with severe ISR in pRCA and m/dRCA lesion both treated with DES.   Debility 08/01/2012   Dementia (HCC)    Depression    takes Cymbalta  daily   Diabetes mellitus    insulin  daily   DOE (dyspnea on exertion) 06/24/2014   Dysphagia,  unspecified(787.20) 07/31/2012   Fungal dermatitis 11/13/2007   Qualifier: Diagnosis of  By: Jame  MD, Maude FALCON    GERD (gastroesophageal reflux disease)    takes Protonix  daily   Gout, unspecified    Headache    occasionally   History of anemia due to chronic kidney disease 03/26/2022    History of blood clots    History of deep venous thrombosis 01/27/2012   History of pulmonary embolus (PE) 04/22/2014   Hyperkalemia 07/26/2017   Hyperlipidemia    takes Pravastatin  daily   Hypertension    Insomnia    Joint pain    Joint swelling    Morbid obesity (HCC)    Multiple benign nevi 11/15/2016   Myocardial infarction [facility]) 2004   Non-STEMI (non-ST elevated myocardial infarction) (HCC) 02/15/2020   Nonintractable headache 01/07/2008   Qualifier: Diagnosis of   By: Jame  MD, Maude FALCON        Numbness    Obstructive sleep apnea    does not wear cpap   Osteoarthritis    Osteoarthritis    Osteoarthrosis, unspecified whether generalized or localized, lower leg    Pain in the chest 12/31/2013   Pain, chronic    Personal history of other diseases of the digestive system 12/03/2006   Qualifier: Diagnosis of  By: Jame  MD, Maude FALCON    Polymyalgia rheumatica [facility])    Presence of stent in right coronary artery    Pulmonary emboli (HCC) 01/2012   felt to need lifelong anticoagulation but Xarelto  dc in 02/2020 due to need for DAPT   Situational depression 06/04/2009   Qualifier: Diagnosis of  By: Jame  MD, Maude FALCON    Syncope, vasovagal 07/04/2021   Type 2 diabetes mellitus with hyperlipidemia (HCC) 02/16/2022   Urinary incontinence    takes Linzess  daily   Vaginitis and vulvovaginitis 06/23/2016   Vertigo    hx of;was taking Meclizine  if needed   Wears dentures     SURGICAL HISTORY: Past Surgical History:  Procedure Laterality Date   ABDOMINAL HYSTERECTOMY     partial   APPENDECTOMY     blood clots/legs and lungs  2013   BREAST BIOPSY Left 07/22/2014   BREAST BIOPSY Left 02/10/2013   BREAST LUMPECTOMY Left 11/05/2014   BREAST LUMPECTOMY WITH RADIOACTIVE SEED LOCALIZATION Left 11/05/2014   Procedure: LEFT BREAST LUMPECTOMY WITH RADIOACTIVE SEED LOCALIZATION;  Surgeon: Vicenta Poli, MD;  Location: [facility] OR;  Service: General;  Laterality: Left;    CARDIAC CATHETERIZATION     COLONOSCOPY     CORONARY ANGIOPLASTY  2   CORONARY STENT INTERVENTION N/A 02/17/2020   Procedure: CORONARY STENT INTERVENTION;  Surgeon: Anner Alm ORN, MD;  Location: [facility] INVASIVE CV LAB;  Service: Cardiovascular;  Laterality: N/A;   ESOPHAGOGASTRODUODENOSCOPY (EGD) WITH PROPOFOL  N/A 11/07/2016   Procedure: ESOPHAGOGASTRODUODENOSCOPY (EGD) WITH PROPOFOL ;  Surgeon: Avram Lupita BRAVO, MD;  Location: [facility] ENDOSCOPY;  Service: Endoscopy;  Laterality: N/A;   ESOPHAGOGASTRODUODENOSCOPY (EGD) WITH PROPOFOL  N/A 01/05/2023   Procedure: ESOPHAGOGASTRODUODENOSCOPY (EGD) WITH PROPOFOL ;  Surgeon: Abran Norleen SAILOR, MD;  Location: [facility] ENDOSCOPY;  Service: Gastroenterology;  Laterality: N/A;   EXCISION OF SKIN TAG Right 11/05/2014   Procedure: EXCISION OF RIGHT EYELID SKIN TAG;  Surgeon: Vicenta Poli, MD;  Location: [facility] OR;  Service: General;  Laterality: Right;   EYE SURGERY Bilateral    cataract    GASTRIC BYPASS  1977    reversed in [facility], [facility]   LEFT HEART CATH AND CORONARY ANGIOGRAPHY N/A  02/17/2020   Procedure: LEFT HEART CATH AND CORONARY ANGIOGRAPHY;  Surgeon: Anner Alm ORN, MD;  Location: [facility] INVASIVE CV LAB;  Service: Cardiovascular;  Laterality: N/A;   LEFT HEART CATHETERIZATION WITH CORONARY ANGIOGRAM N/A 06/29/2014   Procedure: LEFT HEART CATHETERIZATION WITH CORONARY ANGIOGRAM;  Surgeon: Debby DELENA Sor, MD;  Location: [facility] CATH LAB;  Service: Cardiovascular;  Laterality: N/A;   MEMBRANE PEEL Right 10/23/2018   Procedure: MEMBRANE PEEL;  Surgeon: Elner Arley DELENA, MD;  Location: [facility] OR;  Service: Ophthalmology;  Laterality: Right;   MI with stent placement  2004   PARS PLANA VITRECTOMY Right 10/23/2018   Procedure: PARS PLANA VITRECTOMY WITH 25 GAUGE;  Surgeon: Elner Arley DELENA, MD;  Location: [facility] OR;  Service: Ophthalmology;  Laterality: Right;    SOCIAL HISTORY: Social History   Socioeconomic History   Marital status: Widowed    Spouse name: Not on file   Number of  children: 3   Years of education: Not on file   Highest education level: High school graduate  Occupational History   Occupation: retired  Tobacco Use   Smoking status: Never    Passive exposure: Past   Smokeless tobacco: Never   Tobacco comments:    Both parents smoked, patient was exposed/ Raised up in smoke, my whole life.  Vaping Use   Vaping status: Never Used  Substance and Sexual Activity   Alcohol use: No    Alcohol/week: 0.0 standard drinks of alcohol   Drug use: No   Sexual activity: Not on file  Other Topics Concern   Not on file  Social History Narrative   Not on file   Social Drivers of Health   Financial Resource Strain: Low Risk  (09/28/2023)   Overall Financial Resource Strain (CARDIA)    Difficulty of Paying Living Expenses: Not hard at all  Food Insecurity: No Food Insecurity (10/05/2023)   Hunger Vital Sign    Worried About Running Out of Food in the Last Year: Never true    Ran Out of Food in the Last Year: Never true  Transportation Needs: No Transportation Needs (10/05/2023)   PRAPARE - Administrator, Civil Service (Medical): No    Lack of Transportation (Non-Medical): No  Physical Activity: Inactive (09/28/2023)   Exercise Vital Sign    Days of Exercise per Week: 0 days    Minutes of Exercise per Session: 0 min  Stress: Stress Concern Present (09/28/2023)   [employer] of Occupational Health - Occupational Stress Questionnaire    Feeling of Stress : To some extent  Social Connections: Socially Isolated (10/05/2023)   Social Connection and Isolation Panel    Frequency of Communication with Friends and Family: Three times a week    Frequency of Social Gatherings with Friends and Family: More than three times a week    Attends Religious Services: Never    Database administrator or Organizations: No    Attends Banker Meetings: Never    Marital Status: Widowed  Intimate Partner Violence: Not At Risk (10/05/2023)    Humiliation, Afraid, Rape, and Kick questionnaire    Fear of Current or Ex-Partner: No    Emotionally Abused: No    Physically Abused: No    Sexually Abused: No    FAMILY HISTORY: Family History  Problem Relation Age of Onset   Breast cancer Mother 48   Heart disease Mother    Throat cancer Father    Hypertension Father    Arthritis  Father    Diabetes Father    Arthritis Sister    Obesity Sister    Diabetes Sister    Heart disease Cousin    Colon cancer Neg Hx    Stomach cancer Neg Hx    Esophageal cancer Neg Hx     ALLERGIES:  is allergic to sulfonamide derivatives, duloxetine , lokelma  [sodium zirconium cyclosilicate ], aricept  [donepezil  hcl], and tramadol .  MEDICATIONS:  Current Outpatient Medications  Medication Sig Dispense Refill   albuterol  (PROAIR  HFA) 108 (90 Base) MCG/ACT inhaler Inhale 1-2 puffs into the lungs every 6 (six) hours as needed for wheezing or shortness of breath. 6.7 g 1   amitriptyline  (ELAVIL ) 75 MG tablet TAKE 1 TABLET BY MOUTH AT BEDTIME 90 tablet 0   atorvastatin  (LIPITOR ) 80 MG tablet TAKE 1 TABLET BY MOUTH EVERY DAY 90 tablet 3   azelastine  (ASTELIN ) 0.1 % nasal spray Place 2 sprays into both nostrils 2 (two) times daily. 30 mL 12   butalbital -acetaminophen -caffeine  (FIORICET ) 50-325-40 MG tablet Take 1 tablet by mouth every 6 (six) hours as needed for headache. 20 tablet 0   calcitRIOL  (ROCALTROL ) 0.25 MCG capsule Take 0.25 mcg by mouth daily.     carvedilol  (COREG ) 12.5 MG tablet TAKE 1 TABLET BY MOUTH 2 TIMES DAILY 180 tablet 1   cetirizine  (ZYRTEC ) 10 MG tablet Take 10 mg by mouth daily as needed for allergies or rhinitis.     Continuous Glucose Receiver (FREESTYLE LIBRE 3 READER) DEVI Continuous glucose monitor (Patient not taking: Reported on 11/21/2023) 1 each 0   Continuous Glucose Sensor (FREESTYLE LIBRE 14 DAY SENSOR) MISC PLACE 1 DEVICE ON THE SKIN AS DIRECTED EVERY 14 DAYS (Patient not taking: Reported on 11/21/2023) 6 each 3    diclofenac  Sodium (VOLTAREN ) 1 % GEL Apply 2 g topically 4 (four) times daily. (Patient taking differently: Apply 2 g topically 4 (four) times daily as needed (pain).) 150 g 2   ELIQUIS  5 MG TABS tablet TAKE 1 TABLET BY MOUTH 2 TIMES DAILY 180 tablet 3   febuxostat  (ULORIC ) 40 MG tablet TAKE 1 TABLET BY MOUTH EVERY DAY 90 tablet 3   fluticasone  (FLONASE ) 50 MCG/ACT nasal spray Place 2 sprays into both nostrils daily. 16 g 6   Fluticasone -Umeclidin-Vilant (TRELEGY ELLIPTA ) 100-62.5-25 MCG/ACT AEPB Inhale 1 puff into the lungs daily. 1 each 11   folic acid  (FOLVITE ) 1 MG tablet TAKE 1 TABLET BY MOUTH EVERY DAY 90 tablet 1   glucagon  1 MG injection Inject 1 mg into the vein once as needed for up to 1 dose. 1 each 12   glucose 4 GM chewable tablet Chew 1 tablet (4 g total) by mouth as needed for low blood sugar. 50 tablet 12   hydrOXYzine  (ATARAX ) 25 MG tablet TAKE 1 TABLET BY MOUTH EVERY 8 HOURS AS NEEDED FOR ANXIETY OR ITCHING (NASAL AND THROAT CONGESTION) 120 tablet 0   insulin  glargine, 2 Unit Dial , (TOUJEO  MAX SOLOSTAR) 300 UNIT/ML Solostar Pen Inject 28 units subcutaneously one daily 12 mL 0   ipratropium-albuterol  (DUONEB) 0.5-2.5 (3) MG/3ML SOLN Take 3 mLs by nebulization every 6 (six) hours as needed (cough, wheezing, or shortness of breath). 360 mL 0   meclizine  (ANTIVERT ) 25 MG tablet Take 1 tablet (25 mg total) by mouth 2 (two) times daily. 180 tablet 1   melatonin 5 MG TABS Take 1 tablet (5 mg total) by mouth at bedtime as needed. 15 tablet 0   memantine  (NAMENDA ) 5 MG tablet TAKE 2 TABLETS BY MOUTH  EVERY MORNING and TAKE 1 TABLET EVERY EVENING 270 tablet 5   montelukast  (SINGULAIR ) 10 MG tablet Take 1 tablet (10 mg total) by mouth at bedtime. 30 tablet 3   nitroGLYCERIN  (NITROSTAT ) 0.4 MG SL tablet Dissolve 1 tablet under the tongue every 5 minutes as needed for chest pain. Max of 3 doses, then 911. 25 tablet 6   NOVOLOG  FLEXPEN 100 UNIT/ML FlexPen INJECT 14 UNITS SUBCUTANEOUSLY TWICE DAILY  WITH A MEAL . DO NOT EXCEED 70 PER 24 HOURS (Patient taking differently: Sliding Scale before meals:140-199 2 units, 200-250 6, 251-299 8, 300-349 12, 350+ 14) 15 mL 0   nystatin  (MYCOSTATIN /NYSTOP ) powder Apply 1 Application topically 3 (three) times daily. 60 g 3   ondansetron  (ZOFRAN -ODT) 4 MG disintegrating tablet Take 1 tablet (4 mg total) by mouth every 8 (eight) hours as needed for nausea or vomiting. 20 tablet 0   pantoprazole  (PROTONIX ) 20 MG tablet TAKE 1 TABLET BY MOUTH ONCE DAILY 30 tablet 5   predniSONE  (DELTASONE ) 10 MG tablet Take 4 tablets (40 mg total) by mouth daily with breakfast. 120 tablet 6   ranolazine  (RANEXA ) 500 MG 12 hr tablet Take 1 tablet (500 mg total) by mouth 2 (two) times daily. 60 tablet 1   Semaglutide  (RYBELSUS ) 7 MG TABS Take 1 tablet (7 mg total) by mouth daily. 30 tablet 1   torsemide  (DEMADEX ) 20 MG tablet TAKE 1 TABLET BY MOUTH EVERY DAY MAY TAKE AN EXTRA DOES FOR WEIGHT GAIN OF 3LBs IN 1 DAY OR 5LBS IN 1 WEEK OR FOR WORSENING SWELLING 30 tablet 1   TYLENOL  500 MG tablet Take 1,000 mg by mouth every 6 (six) hours as needed for mild pain or headache.     TYLENOL  PM EXTRA STRENGTH 500-25 MG TABS tablet Take 2 tablets by mouth at bedtime.     Vitamin D , Ergocalciferol , (DRISDOL ) 1.25 MG (50000 UNIT) CAPS capsule TAKE 1 CAPSULE BY MOUTH ONCE WEEKLY ON MONDAY 4 capsule 1   No current facility-administered medications for this visit.    REVIEW OF SYSTEMS:    10 Point review of Systems was done is negative except as noted above.   PHYSICAL EXAMINATION:  ECOG PERFORMANCE STATUS:3  Vitals:   11/26/23 1344  BP: (!) 110/55  Pulse: 83  Resp: 18  Temp: (!) 97 F (36.1 C)  SpO2: 100%   GENERAL:alert, in no acute distress and comfortable SKIN: no acute rashes, no significant lesions EYES: conjunctiva are pink and non-injected, sclera anicteric OROPHARYNX: MMM, no exudates, no oropharyngeal erythema or ulceration NECK: supple, no JVD LYMPH:  no palpable  lymphadenopathy in the cervical, axillary or inguinal regions LUNGS: clear to auscultation b/l with normal respiratory effort HEART: regular rate & rhythm ABDOMEN:  normoactive bowel sounds , non tender, not distended. Extremity: no pedal edema PSYCH: alert & oriented x 3 with fluent speech NEURO: no focal motor/sensory deficits   LABORATORY DATA:  I have reviewed the data as listed  .    Latest Ref Rng & Units 11/28/2023    1:34 PM 11/26/2023    1:16 PM 11/23/2023    5:11 PM  CBC  WBC 4.0 - 10.5 K/uL 16.2  13.5  11.6   Hemoglobin 12.0 - 15.0 g/dL 7.4  8.0  8.4   Hematocrit 36.0 - 46.0 % 24.7  26.5  27.5   Platelets 150 - 400 K/uL 262  278  301    . CBC    Component Value Date/Time   WBC 16.2 (  H) 11/28/2023 1334   RBC 2.52 (L) 11/28/2023 1334   HGB 7.4 (L) 11/28/2023 1334   HGB 8.0 (L) 11/26/2023 1316   HGB 9.3 (L) 03/02/2020 1517   HGB 8.7 (L) 03/21/2017 1351   HCT 24.7 (L) 11/28/2023 1334   HCT 29.5 (L) 03/02/2020 1517   HCT 28.9 (L) 03/21/2017 1351   PLT 262 11/28/2023 1334   PLT 278 11/26/2023 1316   PLT 254 03/02/2020 1517   MCV 98.0 11/28/2023 1334   MCV 88 03/02/2020 1517   MCV 84.3 03/21/2017 1351   MCH 29.4 11/28/2023 1334   MCHC 30.0 11/28/2023 1334   RDW 15.5 11/28/2023 1334   RDW 13.1 03/02/2020 1517   RDW 15.5 (H) 03/21/2017 1351   LYMPHSABS 3.0 11/28/2023 1334   LYMPHSABS 2.4 03/21/2017 1351   MONOABS 1.0 11/28/2023 1334   MONOABS 0.7 03/21/2017 1351   EOSABS 0.5 11/28/2023 1334   EOSABS 0.2 03/21/2017 1351   BASOSABS 0.1 11/28/2023 1334   BASOSABS 0.0 03/21/2017 1351   .    Latest Ref Rng & Units 11/28/2023    1:34 PM 11/26/2023    1:16 PM 11/23/2023    5:11 PM  CMP  Glucose 70 - 99 mg/dL 878  842  77   BUN 8 - 23 mg/dL 73  68  60   Creatinine 0.44 - 1.00 mg/dL 6.90  7.16  7.36   Sodium 135 - 145 mmol/L 133  140  141   Potassium 3.5 - 5.1 mmol/L 4.3  4.2  4.5   Chloride 98 - 111 mmol/L 100  104  103   CO2 22 - 32 mmol/L 21  28  25     Calcium  8.9 - 10.3 mg/dL 7.5  8.4  8.7   Total Protein 6.5 - 8.1 g/dL 6.1  7.0  7.3   Total Bilirubin 0.0 - 1.2 mg/dL 0.4  0.4  0.3   Alkaline Phos 38 - 126 U/L 68  79  94   AST 15 - 41 U/L 17  12  18    ALT 0 - 44 U/L 9  6  9      . Lab Results  Component Value Date   IRON  50 11/26/2023   TIBC 221 (L) 11/26/2023   IRONPCTSAT 23 11/26/2023   (Iron  and TIBC)  Lab Results  Component Value Date   FERRITIN 210 11/26/2023       RADIOGRAPHIC STUDIES: I have personally reviewed the radiological images as listed and agreed with the findings in the report.  CT CHEST ABDOMEN PELVIS WO CONTRAST Result Date: 11/28/2023 CLINICAL DATA:  chest pain and abdominal pain EXAM: CT CHEST, ABDOMEN AND PELVIS WITHOUT CONTRAST TECHNIQUE: Multidetector CT imaging of the chest, abdomen and pelvis was performed following the standard protocol without IV contrast. RADIATION DOSE REDUCTION: This exam was performed according to the departmental dose-optimization program which includes automated exposure control, adjustment of the mA and/or kV according to patient size and/or use of iterative reconstruction technique. COMPARISON:  CT of the abdomen and pelvis dated June 04, 2023. FINDINGS: CT CHEST FINDINGS Cardiovascular: Severe calcific coronary artery disease. The heart is normal in size. There is no pericardial effusion. The thoracic aorta demonstrates mild to moderate diffuse calcific atheromatous disease. Mediastinum/Nodes: The trachea and mainstem bronchi are unremarkable. The subacute appears normal. There are few shotty mediastinal lymph nodes. Lungs/Pleura: There are few ground-glass and reticular opacities present dependently within the lower lobes bilaterally. There is mild bronchiolectasis also present within the lower lobes.  There are few hazy and reticular opacities present laterally within the right upper lobe. There are no pleural effusions present. No pneumothorax. Musculoskeletal: The bones are  intact. There is no apparent soft tissue injury. CT ABDOMEN PELVIS FINDINGS Hepatobiliary: There are calcified stones lying dependently within the gallbladder. There is no evidence of cholecystitis. The liver is unremarkable. There is no biliary ductal dilatation. Pancreas: Normal. Spleen: Negative. Adrenals/Urinary Tract: The adrenal glands are unremarkable. There are simple appearing cysts within the kidneys bilaterally. No solid lesions or calculi are evident. There is no evidence of acute traumatic injury. The ureters are normal in caliber and free of calculi. The urinary bladder is unremarkable. Stomach/Bowel: Surgical clips are noted within the proximal stomach. There are small bowel anastomosis sutures present bilaterally. There is no evidence of acute bowel injury. There are several sigmoid diverticula present. There is no evidence of diverticulitis. Vascular/Lymphatic: The abdominal aorta demonstrates moderate calcific atheromatous disease. It is normal in caliber. Reproductive: The uterus and adnexa are unremarkable. Other: The patient is status post abdominal herniorrhaphy. There is no free fluid or pelvic lymphadenopathy present. Musculoskeletal: Extensive degenerative changes throughout the lumbar spine. No evidence of acute traumatic injury. The bony pelvis is intact. IMPRESSION: 1. No evidence of acute traumatic injury of the chest, abdomen or pelvis. 2. Status post gastric surgery and partial small-bowel resection. Electronically Signed   By: Evalene Coho M.D.   On: 11/28/2023 14:22   CT Head Wo Contrast Result Date: 11/28/2023 CLINICAL DATA:  Headache, increasing frequency or severity EXAM: CT HEAD WITHOUT CONTRAST TECHNIQUE: Contiguous axial images were obtained from the base of the skull through the vertex without intravenous contrast. RADIATION DOSE REDUCTION: This exam was performed according to the departmental dose-optimization program which includes automated exposure control,  adjustment of the mA and/or kV according to patient size and/or use of iterative reconstruction technique. COMPARISON:  CT the head dated Sep 25, 2023. FINDINGS: Brain: There is age related atrophy and moderate diffuse cerebral white matter disease. There is no evidence of hemorrhage, mass, acute cortical infarct or hydrocephalus. There is a dystrophic calcification again demonstrated within the pons posterolaterally on the right. There is also prominent dystrophic calcification along the left tentorial leaflet anteriorly, as before. Vascular: Mild to moderate calcific atheromatous disease within the carotid siphons. Skull: Hyperostosis frontalis interna, normal variant. No osseous lesions present. Sinuses/Orbits: Moderate opacification of the right frontal and anterior ethmoid air cells. Status post bilateral lens replacement. Other: None. IMPRESSION: 1. Age-related atrophy and moderate diffuse cerebral white matter disease. No apparent acute process. 2. Right frontal and ethmoid sinus disease. Electronically Signed   By: Evalene Coho M.D.   On: 11/28/2023 14:12   US  Venous Img Lower Right (DVT Study) Result Date: 11/23/2023 CLINICAL DATA:  Right lower leg swelling x1 week. EXAM: RIGHT LOWER EXTREMITY VENOUS DOPPLER ULTRASOUND TECHNIQUE: Gray-scale sonography with graded compression, as well as color Doppler and duplex ultrasound were performed to evaluate the lower extremity deep venous systems from the level of the common femoral vein and including the common femoral, femoral, profunda femoral, popliteal and calf veins including the posterior tibial, peroneal and gastrocnemius veins when visible. The superficial great saphenous vein was also interrogated. Spectral Doppler was utilized to evaluate flow at rest and with distal augmentation maneuvers in the common femoral, femoral and popliteal veins. COMPARISON:  None Available. FINDINGS: Contralateral Common Femoral Vein: Respiratory phasicity is normal  and symmetric with the symptomatic side. No evidence of thrombus. Normal compressibility. Common Femoral  Vein: No evidence of thrombus. Normal compressibility, respiratory phasicity and response to augmentation. Saphenofemoral Junction: No evidence of thrombus. Normal compressibility and flow on color Doppler imaging. Profunda Femoral Vein: No evidence of thrombus. Normal compressibility and flow on color Doppler imaging. Femoral Vein: The RIGHT femoral vein is limited in visualization. Popliteal Vein: No evidence of thrombus. Normal compressibility, respiratory phasicity and response to augmentation. Calf Veins: The RIGHT posterior tibial vein is limited in visualization, while the RIGHT peroneal vein is not visualized. Superficial Great Saphenous Vein: No evidence of thrombus. Normal compressibility. Venous Reflux:  None. Other Findings: The study is technically limited secondary to the patient's body habitus and shadowing from arterial calcifications, as per the ultrasound technologist. IMPRESSION: Limited evaluation of the RIGHT femoral vein, RIGHT posterior tibial vein and RIGHT peroneal vein, without evidence of DVT within the RIGHT extremity. Electronically Signed   By: Suzen Dials M.D.   On: 11/23/2023 19:29   DG Chest Port 1 View Result Date: 11/23/2023 CLINICAL DATA:  [id number] Leg swelling [id number] EXAM: PORTABLE CHEST - 1 VIEW COMPARISON:  None available. FINDINGS: Low lung volumes. Bilateral perihilar interstitial opacities. No focal airspace consolidation or pneumothorax. No cardiomegaly. Tortuous aorta with aortic atherosclerosis. No acute fracture or destructive lesions. Multilevel thoracic osteophytosis. IMPRESSION: Low lung volumes. Bilateral perihilar interstitial opacities, which may reflect bronchovascular crowding, interstitial edema, or atypical/viral infection. Electronically Signed   By: Rogelia Myers M.D.   On: 11/23/2023 17:39   TEMPORAL ARTERY Result Date: 11/20/2023  TEMPORAL  ARTERY REPORT Patient Name:  [name]  Date of Exam:   11/19/2023 Medical Rec #: [id number]        Accession #:    [id number] Date of Birth: 1934/05/13        Patient Gender: F Patient Age:   28 years Exam Location:  [facility] Procedure:      VAS US  TEMPORAL ARTERY BILATERAL Referring Phys: MCNEILL [facility] --------------------------------------------------------------------------------  Indications: Patient admitted to hospital 09/13/2023 with asymmetric temporal              pulses, double vision, left sided headache, and lab work consistent              with GCA. Patient treated with prednisone . High Risk Factors: Age > 50 yrs and female.  Comparison Study: No prior study Performing Technologist: Alberta Lis RVS  Examination Guidelines: Patient in reclined position. 2D, color and spectral doppler sampling in the temporal artery along the hairline and temple in the longitudinal plane. 2D images along the hairline and temple in the transverse plane. Exam is bilateral.  No Halo noted in the bilateral temporal arteries. Bilateral temporal arteries are compressible. Summary:  *See table(s) above for measurements and observations.  Electronically signed by Eather Popp MD on 11/20/2023 at 11:35:26 AM.   Final    Diagnostic mammogram b/l 11/01/17  IMPRESSION: No significant change in suspicious microcalcifications posterior to the lumpectomy site in the left breast compared to the mammogram of 2018. No suspicious findings in the right breast. Exclusion of some posterior tissue due to patient decreased mobility.   RECOMMENDATION: Diagnostic mammogram is suggested in 1 year. (Code:DM-B-01Y)  CLINICAL DATA:  Annual mammography. The patient had surgery for DCIS in the left breast in 2016. There were positive margins at surgery but the patient could not return for additional surgery. Known suspicious calcifications remain. EXAM: 2D DIGITAL DIAGNOSTIC BILATERAL MAMMOGRAM WITH CAD AND ADJUNCT  TOMO COMPARISON:  Previous exam(s). ACR Breast Density Category b: There  are scattered areas of fibroglandular density. FINDINGS: The suspicious calcifications posterior to the left lumpectomy site are stable. No other interval changes or other suspicious findings. Mammographic images were processed with CAD. IMPRESSION: Continued suspicious calcifications posterior to the left lumpectomy site. No other changes. RECOMMENDATION: Continued annual mammography for surveillance. Continued oncologic follow up. I have discussed the findings and recommendations with the patient. Results were also provided in writing at the conclusion of the visit. If applicable, a reminder letter will be sent to the patient regarding the next appointment. BI-RADS CATEGORY  6: Known biopsy-proven malignancy.     Electronically Signed   By: Alm Pouch III M.D   On: 08/16/2016 15:21    ASSESSMENT & PLAN:   Mrs. Alviar is a very wonderful 89 y.o. African-American female with multiple medical comorbidities as described above with   #1 Left-sided presumed stage IA  (pT1c,pNx[cN0], cM0) invasive ductal carcinoma grade 2 out of 3, strongly ER +100%, strongly PR +100% and HER-2/neu negative. Given her high risk cardiac status lumpectomy had to be done under local anesthesia and sentinel lymph node biopsy was not possible. She has accompanying DCIS of intermediate grade present at margins. ECOG performance status is 3. Dexa scan low bone mass per WHO. -Bone density scan on 03/12/2017 with results showing: T-score of 0.5 at left forearm. Lumbar spine and dual femurs not completed due to patient in a wheelchair and not able to lay flat due to SOB while laying flat.   #2 significant coronary artery disease with stress test in February 2016 suggesting likely multivessel disease. Has uncontrolled diabetes, hypertension, dyslipidemia, untreated sleep apnea, coronary disease, severe arthritis all of which are  significantly limiting her quality of life.  #3 vitamin D  deficiency - 25OH vitamin D  level of 10, s/p high dose ergocalciferol  re-placement with 25OH Vit D improvement to 30.9 in 10/2017. Now on vit D 2000 units daily.   #4  Anemia- anemia of chronic disease from CKD + ? Slow GI losses . Multiple metabolic insults in the bone marrow including uncontrolled diabetes  Now with more reapid drop due to concerns for active GI melena and hematuria.  PLAN:  -Discussed lab results from today, 11/26/2023, in detail with patient  - US  RLE no DVT, has chronic venous reflux cause leg swelling. -no clinical signs or symptoms of breast cancer progression at this time . -per patients wishes no additional current rx for breast cancer at this time. -hgb 7.4 no lightheadedness or dizziness. -ferritin 210 with iron  saturation of 23% - no indication for additional IV Iron  at this time -restart Retacrit  40k units every 4 weeks. Hold ofr hgb>10.5 -transfuse prn for hgb<7 or if symptomatic  FOLLOW-UP: Continue Retacrit  every 4 weeks with labs RTC with Dr Onesimo with labs in 8 weeks  The total time spent in the appointment was 30 minutes* .  All of the patient's questions were answered with apparent satisfaction. The patient knows to call the clinic with any problems, questions or concerns.   Emaline Onesimo MD MS AAHIVMS [facility] Hematology/Oncology Physician [facility]  .*Total Encounter Time as defined by the Centers for Medicare and Medicaid Services includes, in addition to the face-to-face time of a patient visit (documented in the note above) non-face-to-face time: obtaining and reviewing outside history, ordering and reviewing medications, tests or procedures, care coordination (communications with other health care professionals or caregivers) and documentation in the medical record.    I,Mitra Faeizi,acting as a Neurosurgeon for Doryce Mcgregory  Onesimo, MD.,have documented all relevant documentation on the  behalf of Emaline Onesimo, MD,as directed by  Emaline Onesimo, MD while in the presence of Emaline Onesimo, MD.  .I have reviewed the above documentation for accuracy and completeness, and I agree with the above. .Jay Kempe Kishore Wynter Grave MD

## 2023-11-26 ENCOUNTER — Inpatient Hospital Stay: Attending: Internal Medicine

## 2023-11-26 ENCOUNTER — Inpatient Hospital Stay: Admitting: Hematology

## 2023-11-26 ENCOUNTER — Inpatient Hospital Stay

## 2023-11-26 VITALS — BP 110/55 | HR 83 | Temp 97.0°F | Resp 18 | Wt 269.5 lb

## 2023-11-26 DIAGNOSIS — N183 Chronic kidney disease, stage 3 unspecified: Secondary | ICD-10-CM

## 2023-11-26 DIAGNOSIS — Z170 Estrogen receptor positive status [ER+]: Secondary | ICD-10-CM | POA: Insufficient documentation

## 2023-11-26 DIAGNOSIS — Z79811 Long term (current) use of aromatase inhibitors: Secondary | ICD-10-CM | POA: Insufficient documentation

## 2023-11-26 DIAGNOSIS — I130 Hypertensive heart and chronic kidney disease with heart failure and stage 1 through stage 4 chronic kidney disease, or unspecified chronic kidney disease: Secondary | ICD-10-CM | POA: Diagnosis not present

## 2023-11-26 DIAGNOSIS — D631 Anemia in chronic kidney disease: Secondary | ICD-10-CM

## 2023-11-26 DIAGNOSIS — D509 Iron deficiency anemia, unspecified: Secondary | ICD-10-CM

## 2023-11-26 DIAGNOSIS — Z79899 Other long term (current) drug therapy: Secondary | ICD-10-CM | POA: Diagnosis not present

## 2023-11-26 DIAGNOSIS — C50812 Malignant neoplasm of overlapping sites of left female breast: Secondary | ICD-10-CM | POA: Diagnosis not present

## 2023-11-26 DIAGNOSIS — E559 Vitamin D deficiency, unspecified: Secondary | ICD-10-CM | POA: Diagnosis not present

## 2023-11-26 DIAGNOSIS — C50912 Malignant neoplasm of unspecified site of left female breast: Secondary | ICD-10-CM | POA: Diagnosis not present

## 2023-11-26 LAB — CMP (CANCER CENTER ONLY)
ALT: 6 U/L (ref 0–44)
AST: 12 U/L — ABNORMAL LOW (ref 15–41)
Albumin: 3.2 g/dL — ABNORMAL LOW (ref 3.5–5.0)
Alkaline Phosphatase: 79 U/L (ref 38–126)
Anion gap: 8 (ref 5–15)
BUN: 68 mg/dL — ABNORMAL HIGH (ref 8–23)
CO2: 28 mmol/L (ref 22–32)
Calcium: 8.4 mg/dL — ABNORMAL LOW (ref 8.9–10.3)
Chloride: 104 mmol/L (ref 98–111)
Creatinine: 2.83 mg/dL — ABNORMAL HIGH (ref 0.44–1.00)
GFR, Estimated: 16 mL/min — ABNORMAL LOW (ref >60)
Glucose, Bld: 157 mg/dL — ABNORMAL HIGH (ref 70–99)
Potassium: 4.2 mmol/L (ref 3.5–5.1)
Sodium: 140 mmol/L (ref 135–145)
Total Bilirubin: 0.4 mg/dL (ref 0.0–1.2)
Total Protein: 7 g/dL (ref 6.5–8.1)

## 2023-11-26 LAB — CBC WITH DIFFERENTIAL (CANCER CENTER ONLY)
Abs Immature Granulocytes: 0.16 K/uL — ABNORMAL HIGH (ref 0.00–0.07)
Basophils Absolute: 0.1 K/uL (ref 0.0–0.1)
Basophils Relative: 0 %
Eosinophils Absolute: 0.3 K/uL (ref 0.0–0.5)
Eosinophils Relative: 3 %
HCT: 26.5 % — ABNORMAL LOW (ref 36.0–46.0)
Hemoglobin: 8 g/dL — ABNORMAL LOW (ref 12.0–15.0)
Immature Granulocytes: 1 %
Lymphocytes Relative: 22 %
Lymphs Abs: 3 K/uL (ref 0.7–4.0)
MCH: 28.8 pg (ref 26.0–34.0)
MCHC: 30.2 g/dL (ref 30.0–36.0)
MCV: 95.3 fL (ref 80.0–100.0)
Monocytes Absolute: 0.9 K/uL (ref 0.1–1.0)
Monocytes Relative: 7 %
Neutro Abs: 9.1 K/uL — ABNORMAL HIGH (ref 1.7–7.7)
Neutrophils Relative %: 67 %
Platelet Count: 278 K/uL (ref 150–400)
RBC: 2.78 MIL/uL — ABNORMAL LOW (ref 3.87–5.11)
RDW: 15.7 % — ABNORMAL HIGH (ref 11.5–15.5)
WBC Count: 13.5 K/uL — ABNORMAL HIGH (ref 4.0–10.5)
nRBC: 0.2 % (ref 0.0–0.2)

## 2023-11-26 LAB — IRON AND IRON BINDING CAPACITY (CC-WL,HP ONLY)
Iron: 50 ug/dL (ref 28–170)
Saturation Ratios: 23 % (ref 10.4–31.8)
TIBC: 221 ug/dL — ABNORMAL LOW (ref 250–450)
UIBC: 171 ug/dL (ref 148–442)

## 2023-11-26 LAB — FERRITIN: Ferritin: 210 ng/mL (ref 11–307)

## 2023-11-26 MED ORDER — EPOETIN ALFA 20000 UNIT/ML IJ SOLN
20000.0000 [IU] | Freq: Once | INTRAMUSCULAR | Status: AC
  Administered 2023-11-26: 20000 [IU] via SUBCUTANEOUS
  Filled 2023-11-26: qty 1

## 2023-11-27 ENCOUNTER — Other Ambulatory Visit: Payer: Self-pay

## 2023-11-27 ENCOUNTER — Telehealth: Payer: Self-pay

## 2023-11-27 NOTE — Progress Notes (Signed)
 Complex Care Management Care Guide Note  11/27/2023 Name: [name] MRN: [id number] DOB: 1934/07/02  [name] is a 89 y.o. year old female who is a primary care patient of Sebastian Beverley NOVAK, MD and is actively engaged with the care management team. I reached out to [name] by phone today to assist with re-scheduling  with the RN Case Manager.  Follow up plan: Telephone appointment with complex care management team member scheduled for:  12/05/23 at 11:30 a.m.   Dreama [facility]  [facility], [facility] Care Management Assistant Direct Dial : [phone]  Fax: [phone]

## 2023-11-27 NOTE — Progress Notes (Unsigned)
   11/27/2023  Patient ID: [name], female   DOB: [birth year], 89 y.o.   MRN: [id number]  Outreach attempt to follow-up with patient's daughter (POA/DPR) on management of diabetes was unsuccessful, but I was able to leave HIPAA compliant voicemail with my direct phone number.  I am also sending a MyChart message to see if we can reschedule visit.  [name], PharmD, DPLA

## 2023-11-28 ENCOUNTER — Emergency Department (HOSPITAL_COMMUNITY)
Admission: EM | Admit: 2023-11-28 | Discharge: 2023-11-28 | Disposition: A | Discharge status: Home / Self Care | Attending: Emergency Medicine | Admitting: Emergency Medicine

## 2023-11-28 ENCOUNTER — Telehealth: Payer: Self-pay

## 2023-11-28 ENCOUNTER — Emergency Department (HOSPITAL_COMMUNITY)

## 2023-11-28 ENCOUNTER — Encounter (HOSPITAL_COMMUNITY): Payer: Self-pay | Admitting: Emergency Medicine

## 2023-11-28 ENCOUNTER — Other Ambulatory Visit: Payer: Self-pay

## 2023-11-28 DIAGNOSIS — G4489 Other headache syndrome: Secondary | ICD-10-CM | POA: Diagnosis not present

## 2023-11-28 DIAGNOSIS — N281 Cyst of kidney, acquired: Secondary | ICD-10-CM | POA: Diagnosis not present

## 2023-11-28 DIAGNOSIS — R110 Nausea: Secondary | ICD-10-CM

## 2023-11-28 DIAGNOSIS — G43809 Other migraine, not intractable, without status migrainosus: Secondary | ICD-10-CM | POA: Diagnosis not present

## 2023-11-28 DIAGNOSIS — Z794 Long term (current) use of insulin: Secondary | ICD-10-CM | POA: Insufficient documentation

## 2023-11-28 DIAGNOSIS — R609 Edema, unspecified: Secondary | ICD-10-CM | POA: Diagnosis not present

## 2023-11-28 DIAGNOSIS — I509 Heart failure, unspecified: Secondary | ICD-10-CM | POA: Diagnosis not present

## 2023-11-28 DIAGNOSIS — I959 Hypotension, unspecified: Secondary | ICD-10-CM | POA: Diagnosis not present

## 2023-11-28 DIAGNOSIS — G9389 Other specified disorders of brain: Secondary | ICD-10-CM | POA: Diagnosis not present

## 2023-11-28 DIAGNOSIS — R109 Unspecified abdominal pain: Secondary | ICD-10-CM | POA: Diagnosis not present

## 2023-11-28 DIAGNOSIS — R600 Localized edema: Secondary | ICD-10-CM | POA: Insufficient documentation

## 2023-11-28 DIAGNOSIS — I499 Cardiac arrhythmia, unspecified: Secondary | ICD-10-CM | POA: Diagnosis not present

## 2023-11-28 DIAGNOSIS — I491 Atrial premature depolarization: Secondary | ICD-10-CM | POA: Diagnosis not present

## 2023-11-28 DIAGNOSIS — R079 Chest pain, unspecified: Secondary | ICD-10-CM | POA: Diagnosis not present

## 2023-11-28 DIAGNOSIS — F0390 Unspecified dementia without behavioral disturbance: Secondary | ICD-10-CM | POA: Diagnosis not present

## 2023-11-28 DIAGNOSIS — R112 Nausea with vomiting, unspecified: Secondary | ICD-10-CM | POA: Diagnosis not present

## 2023-11-28 DIAGNOSIS — Z7901 Long term (current) use of anticoagulants: Secondary | ICD-10-CM | POA: Insufficient documentation

## 2023-11-28 DIAGNOSIS — R9082 White matter disease, unspecified: Secondary | ICD-10-CM | POA: Diagnosis not present

## 2023-11-28 DIAGNOSIS — I700 Atherosclerosis of aorta: Secondary | ICD-10-CM | POA: Diagnosis not present

## 2023-11-28 DIAGNOSIS — R531 Weakness: Secondary | ICD-10-CM

## 2023-11-28 LAB — COMPREHENSIVE METABOLIC PANEL WITH GFR
ALT: 9 U/L (ref 0–44)
AST: 17 U/L (ref 15–41)
Albumin: 2.3 g/dL — ABNORMAL LOW (ref 3.5–5.0)
Alkaline Phosphatase: 68 U/L (ref 38–126)
Anion gap: 12 (ref 5–15)
BUN: 73 mg/dL — ABNORMAL HIGH (ref 8–23)
CO2: 21 mmol/L — ABNORMAL LOW (ref 22–32)
Calcium: 7.5 mg/dL — ABNORMAL LOW (ref 8.9–10.3)
Chloride: 100 mmol/L (ref 98–111)
Creatinine, Ser: 3.09 mg/dL — ABNORMAL HIGH (ref 0.44–1.00)
GFR, Estimated: 14 mL/min — ABNORMAL LOW (ref >60)
Glucose, Bld: 121 mg/dL — ABNORMAL HIGH (ref 70–99)
Potassium: 4.3 mmol/L (ref 3.5–5.1)
Sodium: 133 mmol/L — ABNORMAL LOW (ref 135–145)
Total Bilirubin: 0.4 mg/dL (ref 0.0–1.2)
Total Protein: 6.1 g/dL — ABNORMAL LOW (ref 6.5–8.1)

## 2023-11-28 LAB — CBC WITH DIFFERENTIAL/PLATELET
Abs Immature Granulocytes: 0.3 K/uL — ABNORMAL HIGH (ref 0.00–0.07)
Basophils Absolute: 0.1 K/uL (ref 0.0–0.1)
Basophils Relative: 0 %
Eosinophils Absolute: 0.5 K/uL (ref 0.0–0.5)
Eosinophils Relative: 3 %
HCT: 24.7 % — ABNORMAL LOW (ref 36.0–46.0)
Hemoglobin: 7.4 g/dL — ABNORMAL LOW (ref 12.0–15.0)
Immature Granulocytes: 2 %
Lymphocytes Relative: 18 %
Lymphs Abs: 3 K/uL (ref 0.7–4.0)
MCH: 29.4 pg (ref 26.0–34.0)
MCHC: 30 g/dL (ref 30.0–36.0)
MCV: 98 fL (ref 80.0–100.0)
Monocytes Absolute: 1 K/uL (ref 0.1–1.0)
Monocytes Relative: 6 %
Neutro Abs: 11.4 K/uL — ABNORMAL HIGH (ref 1.7–7.7)
Neutrophils Relative %: 71 %
Platelets: 262 K/uL (ref 150–400)
RBC: 2.52 MIL/uL — ABNORMAL LOW (ref 3.87–5.11)
RDW: 15.5 % (ref 11.5–15.5)
WBC: 16.2 K/uL — ABNORMAL HIGH (ref 4.0–10.5)
nRBC: 0.3 % — ABNORMAL HIGH (ref 0.0–0.2)

## 2023-11-28 LAB — TROPONIN I (HIGH SENSITIVITY): Troponin I (High Sensitivity): 16 ng/L (ref <18)

## 2023-11-28 LAB — URINALYSIS, ROUTINE W REFLEX MICROSCOPIC
Bilirubin Urine: NEGATIVE
Glucose, UA: NEGATIVE mg/dL
Hgb urine dipstick: NEGATIVE
Ketones, ur: NEGATIVE mg/dL
Leukocytes,Ua: NEGATIVE
Nitrite: NEGATIVE
Protein, ur: NEGATIVE mg/dL
Specific Gravity, Urine: 1.006 (ref 1.005–1.030)
pH: 5 (ref 5.0–8.0)

## 2023-11-28 LAB — LIPASE, BLOOD: Lipase: 37 U/L (ref 11–51)

## 2023-11-28 LAB — BRAIN NATRIURETIC PEPTIDE: B Natriuretic Peptide: 94.8 pg/mL (ref 0.0–100.0)

## 2023-11-28 MED ORDER — ONDANSETRON 4 MG PO TBDP: 4.0000 mg | ORAL_TABLET | Freq: Three times a day (TID) | ORAL | 0 refills | Status: DC | PRN

## 2023-11-28 MED ORDER — BUTALBITAL-APAP-CAFFEINE 50-325-40 MG PO TABS: 1.0000 | ORAL_TABLET | Freq: Four times a day (QID) | ORAL | 0 refills | Status: DC | PRN

## 2023-11-28 MED ORDER — DIPHENHYDRAMINE HCL 50 MG/ML IJ SOLN
12.5000 mg | Freq: Once | INTRAMUSCULAR | Status: AC
  Administered 2023-11-28: 12.5 mg via INTRAVENOUS
  Filled 2023-11-28: qty 1

## 2023-11-28 MED ORDER — PROCHLORPERAZINE EDISYLATE 10 MG/2ML IJ SOLN
5.0000 mg | Freq: Once | INTRAMUSCULAR | Status: AC
  Administered 2023-11-28: 5 mg via INTRAVENOUS
  Filled 2023-11-28: qty 2

## 2023-11-28 NOTE — ED Notes (Signed)
 Patient transported to CT

## 2023-11-28 NOTE — ED Provider Notes (Signed)
 I was asked to follow-up on the patient's urinalysis at signout.  She is feeling better after treatment for migraine.  Urinalysis does not show any concerning findings.  The patient is discharged with return precautions.  Physical Exam  BP 127/60   Pulse 62   Temp 97.7 F (36.5 C) (Oral)   Resp 17   SpO2 100%   Physical Exam General: No acute distress  Procedures  Procedures  ED Course / MDM    Medical Decision Making Amount and/or Complexity of Data Reviewed Labs: ordered. Radiology: ordered.  Risk Prescription drug management.          Ula Prentice SAUNDERS, MD 11/28/23 [phone]

## 2023-11-28 NOTE — Discharge Instructions (Addendum)
 Take Zofran  as needed for nausea feeling.  I recommend doing 12.5 mg to 25 mg of Benadryl  every 6-8 hours as needed as well with that Zofran  to help with comfort.  Continue chronic pain management per primary care doctor.  I have also sent a refill of your Fioricet 

## 2023-11-28 NOTE — ED Triage Notes (Signed)
 Pt BIB [facility] from home due generalized weakness and shortness of breath for over a week.  Pt was here Monday for same.  Family states it was due to fluid around the heart.  Hx dementia.   Pt does report headache.  18g left forearm. VS BP 128/66, HR  78, SpO2 94% RA, Resp 24

## 2023-11-28 NOTE — Progress Notes (Signed)
   11/28/2023  Patient ID: [name], female   DOB: 03-09-1935, 89 y.o.   MRN: [id number]  Outreach to follow-up on medication access/adherence.  Patient's daughter, Holley (POA/DPR), states she has not had a chance to call [employer] to inquire about a new Libre reader yet but will this week.  She did discuss resuming Rybleus with Ms. Splinter; and she will restart the Rybelsus  7mg  daily.  She endorsed concern with potential side effects she had read about but had not actually experienced any adverse side effects for the short period of time she took the medication.  Telephone follow-up scheduled for Friday to see if patient is able to get a replacement Rebecca reader.  Otherwise, I will see if insurance will cover a new one.  [name], PharmD, DPLA

## 2023-11-28 NOTE — ED Provider Notes (Signed)
 [place] EMERGENCY DEPARTMENT AT [facility] Provider Note   CSN: [id number] Arrival date & time: 11/28/23  1320     Patient presents with: Weakness   [name] is a 89 y.o. female.   Patient here with diffuse body pain headache generalized weakness abdominal pain nausea.  History of dementia.  History of chronic pain.  History of heart failure.  Lives at home with family.  She states that her pain medicine is making her sick.  She has a history of blood clot on anticoagulation.  She denies any diarrhea.  No fever.  She still somewhat short of breath this time.  She still retaining fluid despite having her torsemide  increased here this past week.  Family feels like they have not noticed a great improvement in urine output or swelling.  They do admit that maybe the pain medicine made her little bit sick with the belly today but they have noticed that she is still retaining fluid.  The history is provided by the patient.       Prior to Admission medications   Medication Sig Start Date End Date Taking? Authorizing Provider  ondansetron  (ZOFRAN -ODT) 4 MG disintegrating tablet Take 1 tablet (4 mg total) by mouth every 8 (eight) hours as needed for nausea or vomiting. 11/28/23  Yes Valeska Haislip, DO  albuterol  (PROAIR  HFA) 108 (90 Base) MCG/ACT inhaler Inhale 1-2 puffs into the lungs every 6 (six) hours as needed for wheezing or shortness of breath. 12/19/18   Caro Harlene POUR, NP  amitriptyline  (ELAVIL ) 75 MG tablet TAKE 1 TABLET BY MOUTH AT BEDTIME 09/11/23   Sebastian Beverley NOVAK, MD  atorvastatin  (LIPITOR ) 80 MG tablet TAKE 1 TABLET BY MOUTH EVERY DAY 11/01/23   Sebastian Beverley NOVAK, MD  azelastine  (ASTELIN ) 0.1 % nasal spray Place 2 sprays into both nostrils 2 (two) times daily. 10/09/23   Sebastian Beverley NOVAK, MD  butalbital -acetaminophen -caffeine  (FIORICET ) 50-325-40 MG tablet Take 1 tablet by mouth every 6 (six) hours as needed for headache. 11/28/23   Detrich Rakestraw, DO  calcitRIOL   (ROCALTROL ) 0.25 MCG capsule Take 0.25 mcg by mouth daily. 10/02/23   [provider]  carvedilol  (COREG ) 12.5 MG tablet TAKE 1 TABLET BY MOUTH 2 TIMES DAILY 11/01/23   Sebastian Beverley NOVAK, MD  cetirizine  (ZYRTEC ) 10 MG tablet Take 10 mg by mouth daily as needed for allergies or rhinitis.    [provider]  Continuous Glucose Receiver (FREESTYLE LIBRE 3 READER) DEVI Continuous glucose monitor Patient not taking: Reported on 11/21/2023 06/21/23   Shamleffer, Ibtehal Jaralla, MD  Continuous Glucose Sensor (FREESTYLE LIBRE 14 DAY SENSOR) MISC PLACE 1 DEVICE ON THE SKIN AS DIRECTED EVERY 14 DAYS Patient not taking: Reported on 11/21/2023 07/16/23   Shamleffer, Ibtehal Jaralla, MD  diclofenac  Sodium (VOLTAREN ) 1 % GEL Apply 2 g topically 4 (four) times daily. Patient taking differently: Apply 2 g topically 4 (four) times daily as needed (pain). 02/23/22   Sebastian Beverley NOVAK, MD  ELIQUIS  5 MG TABS tablet TAKE 1 TABLET BY MOUTH 2 TIMES DAILY 04/09/23   Sebastian Beverley NOVAK, MD  febuxostat  (ULORIC ) 40 MG tablet TAKE 1 TABLET BY MOUTH EVERY DAY 02/22/23   Sebastian Beverley NOVAK, MD  fluticasone  (FLONASE ) 50 MCG/ACT nasal spray Place 2 sprays into both nostrils daily. 10/09/23   Sebastian Beverley NOVAK, MD  Fluticasone -Umeclidin-Vilant (TRELEGY ELLIPTA ) 100-62.5-25 MCG/ACT AEPB Inhale 1 puff into the lungs daily. 01/10/23 01/05/24  Sebastian Beverley NOVAK, MD  folic acid  (FOLVITE ) 1 MG  tablet TAKE 1 TABLET BY MOUTH EVERY DAY 11/01/23   Sebastian Beverley NOVAK, MD  glucagon  1 MG injection Inject 1 mg into the vein once as needed for up to 1 dose. 04/13/22   Sebastian Beverley NOVAK, MD  glucose 4 GM chewable tablet Chew 1 tablet (4 g total) by mouth as needed for low blood sugar. 04/05/22   Sebastian Beverley NOVAK, MD  hydrOXYzine  (ATARAX ) 25 MG tablet TAKE 1 TABLET BY MOUTH EVERY 8 HOURS AS NEEDED FOR ANXIETY OR ITCHING (NASAL AND THROAT CONGESTION) 06/05/23   Sebastian Beverley NOVAK, MD  insulin  glargine, 2 Unit Dial , (TOUJEO  MAX SOLOSTAR) 300 UNIT/ML  Solostar Pen Inject 28 units subcutaneously one daily 11/22/23   Shamleffer, Ibtehal Jaralla, MD  ipratropium-albuterol  (DUONEB) 0.5-2.5 (3) MG/3ML SOLN Take 3 mLs by nebulization every 6 (six) hours as needed (cough, wheezing, or shortness of breath). 10/09/23 01/07/24  Sebastian Beverley NOVAK, MD  meclizine  (ANTIVERT ) 25 MG tablet Take 1 tablet (25 mg total) by mouth 2 (two) times daily. 07/20/23   Sebastian Beverley NOVAK, MD  melatonin 5 MG TABS Take 1 tablet (5 mg total) by mouth at bedtime as needed. 06/08/23   Krishnan, Gokul, MD  memantine  (NAMENDA ) 5 MG tablet TAKE 2 TABLETS BY MOUTH EVERY MORNING and TAKE 1 TABLET EVERY EVENING 10/11/23   Sebastian Beverley NOVAK, MD  montelukast  (SINGULAIR ) 10 MG tablet Take 1 tablet (10 mg total) by mouth at bedtime. 04/24/22   Sebastian Beverley NOVAK, MD  nitroGLYCERIN  (NITROSTAT ) 0.4 MG SL tablet Dissolve 1 tablet under the tongue every 5 minutes as needed for chest pain. Max of 3 doses, then 911. 09/20/23   Nahser, Aleene PARAS, MD  NOVOLOG  FLEXPEN 100 UNIT/ML FlexPen INJECT 14 UNITS SUBCUTANEOUSLY TWICE DAILY WITH A MEAL . DO NOT EXCEED 70 PER 24 HOURS Patient taking differently: Sliding Scale before meals:140-199 2 units, 200-250 6, 251-299 8, 300-349 12, 350+ 14 11/02/23   Shamleffer, Donell Cardinal, MD  nystatin  (MYCOSTATIN /NYSTOP ) powder Apply 1 Application topically 3 (three) times daily. 03/21/23   Billy Knee, FNP  pantoprazole  (PROTONIX ) 20 MG tablet TAKE 1 TABLET BY MOUTH ONCE DAILY 10/05/23   Sebastian Beverley NOVAK, MD  predniSONE  (DELTASONE ) 10 MG tablet Take 4 tablets (40 mg total) by mouth daily with breakfast. 09/25/23   Onita Duos, MD  ranolazine  (RANEXA ) 500 MG 12 hr tablet Take 1 tablet (500 mg total) by mouth 2 (two) times daily. 10/13/23   Fairy Frames, MD  Semaglutide  (RYBELSUS ) 7 MG TABS Take 1 tablet (7 mg total) by mouth daily. Patient not taking: Reported on 11/21/2023 10/09/23   Sebastian Beverley NOVAK, MD  torsemide  (DEMADEX ) 20 MG tablet TAKE 1 TABLET BY MOUTH EVERY DAY MAY  TAKE AN EXTRA DOES FOR WEIGHT GAIN OF 3LBs IN 1 DAY OR 5LBS IN 1 WEEK OR FOR WORSENING SWELLING 11/23/23   Ferdinand Revoir, DO  TYLENOL  500 MG tablet Take 1,000 mg by mouth every 6 (six) hours as needed for mild pain or headache.    [provider]  TYLENOL  PM EXTRA STRENGTH 500-25 MG TABS tablet Take 2 tablets by mouth at bedtime.    [provider]  Vitamin D , Ergocalciferol , (DRISDOL ) 1.25 MG (50000 UNIT) CAPS capsule TAKE 1 CAPSULE BY MOUTH ONCE WEEKLY ON MONDAY 10/08/23   Kale, Gautam Kishore, MD    Allergies: Sulfonamide derivatives, Duloxetine , Lokelma  [sodium zirconium cyclosilicate ], Aricept  [donepezil  hcl], and Tramadol     Review of Systems  Updated Vital Signs BP 127/60   Pulse 62  Temp 97.7 F (36.5 C) (Oral)   Resp 17   SpO2 100%   Physical Exam Vitals and nursing note reviewed.  Constitutional:      General: She is not in acute distress.    Appearance: She is well-developed.     Comments: Patient appears uncomfortable  HENT:     Head: Normocephalic and atraumatic.     Nose: Nose normal.     Mouth/Throat:     Mouth: Mucous membranes are moist.  Eyes:     Extraocular Movements: Extraocular movements intact.     Conjunctiva/sclera: Conjunctivae normal.     Pupils: Pupils are equal, round, and reactive to light.  Cardiovascular:     Rate and Rhythm: Normal rate and regular rhythm.     Pulses: Normal pulses.     Heart sounds: Normal heart sounds. No murmur heard. Pulmonary:     Effort: Pulmonary effort is normal. No respiratory distress.     Breath sounds: Normal breath sounds.  Abdominal:     Palpations: Abdomen is soft.     Tenderness: There is abdominal tenderness.  Musculoskeletal:        General: No swelling.     Cervical back: Normal range of motion and neck supple.     Right lower leg: Edema present.     Left lower leg: Edema present.     Comments: Right leg greater than the left leg at baseline with some edema bilaterally  Skin:     General: Skin is warm and dry.     Capillary Refill: Capillary refill takes less than 2 seconds.  Neurological:     General: No focal deficit present.     Mental Status: She is alert and oriented to person, place, and time.     Cranial Nerves: No cranial nerve deficit.     Sensory: No sensory deficit.     Motor: No weakness.     Coordination: Coordination normal.  Psychiatric:        Mood and Affect: Mood normal.     (all labs ordered are listed, but only abnormal results are displayed) Labs Reviewed  CBC WITH DIFFERENTIAL/PLATELET - Abnormal; Notable for the following components:      Result Value   WBC 16.2 (*)    RBC 2.52 (*)    Hemoglobin 7.4 (*)    HCT 24.7 (*)    nRBC 0.3 (*)    Neutro Abs 11.4 (*)    Abs Immature Granulocytes 0.30 (*)    All other components within normal limits  COMPREHENSIVE METABOLIC PANEL WITH GFR - Abnormal; Notable for the following components:   Sodium 133 (*)    CO2 21 (*)    Glucose, Bld 121 (*)    BUN 73 (*)    Creatinine, Ser 3.09 (*)    Calcium  7.5 (*)    Total Protein 6.1 (*)    Albumin  2.3 (*)    GFR, Estimated 14 (*)    All other components within normal limits  LIPASE, BLOOD  BRAIN NATRIURETIC PEPTIDE  URINALYSIS, ROUTINE W REFLEX MICROSCOPIC  TROPONIN I (HIGH SENSITIVITY)    EKG: EKG Interpretation Date/Time:  Wednesday November 28 2023 14:02:31 EDT Ventricular Rate:  63 PR Interval:  217 QRS Duration:  102 QT Interval:  461 QTC Calculation: 472 R Axis:   22  Text Interpretation: Sinus rhythm Atrial premature complex Confirmed by Ruthe Cornet [phone]) on 11/28/2023 2:10:29 PM  Radiology: CT CHEST ABDOMEN PELVIS WO CONTRAST Result Date: 11/28/2023 CLINICAL DATA:  chest pain and abdominal pain EXAM: CT CHEST, ABDOMEN AND PELVIS WITHOUT CONTRAST TECHNIQUE: Multidetector CT imaging of the chest, abdomen and pelvis was performed following the standard protocol without IV contrast. RADIATION DOSE REDUCTION: This exam was  performed according to the departmental dose-optimization program which includes automated exposure control, adjustment of the mA and/or kV according to patient size and/or use of iterative reconstruction technique. COMPARISON:  CT of the abdomen and pelvis dated June 04, 2023. FINDINGS: CT CHEST FINDINGS Cardiovascular: Severe calcific coronary artery disease. The heart is normal in size. There is no pericardial effusion. The thoracic aorta demonstrates mild to moderate diffuse calcific atheromatous disease. Mediastinum/Nodes: The trachea and mainstem bronchi are unremarkable. The subacute appears normal. There are few shotty mediastinal lymph nodes. Lungs/Pleura: There are few ground-glass and reticular opacities present dependently within the lower lobes bilaterally. There is mild bronchiolectasis also present within the lower lobes. There are few hazy and reticular opacities present laterally within the right upper lobe. There are no pleural effusions present. No pneumothorax. Musculoskeletal: The bones are intact. There is no apparent soft tissue injury. CT ABDOMEN PELVIS FINDINGS Hepatobiliary: There are calcified stones lying dependently within the gallbladder. There is no evidence of cholecystitis. The liver is unremarkable. There is no biliary ductal dilatation. Pancreas: Normal. Spleen: Negative. Adrenals/Urinary Tract: The adrenal glands are unremarkable. There are simple appearing cysts within the kidneys bilaterally. No solid lesions or calculi are evident. There is no evidence of acute traumatic injury. The ureters are normal in caliber and free of calculi. The urinary bladder is unremarkable. Stomach/Bowel: Surgical clips are noted within the proximal stomach. There are small bowel anastomosis sutures present bilaterally. There is no evidence of acute bowel injury. There are several sigmoid diverticula present. There is no evidence of diverticulitis. Vascular/Lymphatic: The abdominal aorta  demonstrates moderate calcific atheromatous disease. It is normal in caliber. Reproductive: The uterus and adnexa are unremarkable. Other: The patient is status post abdominal herniorrhaphy. There is no free fluid or pelvic lymphadenopathy present. Musculoskeletal: Extensive degenerative changes throughout the lumbar spine. No evidence of acute traumatic injury. The bony pelvis is intact. IMPRESSION: 1. No evidence of acute traumatic injury of the chest, abdomen or pelvis. 2. Status post gastric surgery and partial small-bowel resection. Electronically Signed   By: Evalene Coho M.D.   On: 11/28/2023 14:22   CT Head Wo Contrast Result Date: 11/28/2023 CLINICAL DATA:  Headache, increasing frequency or severity EXAM: CT HEAD WITHOUT CONTRAST TECHNIQUE: Contiguous axial images were obtained from the base of the skull through the vertex without intravenous contrast. RADIATION DOSE REDUCTION: This exam was performed according to the departmental dose-optimization program which includes automated exposure control, adjustment of the mA and/or kV according to patient size and/or use of iterative reconstruction technique. COMPARISON:  CT the head dated Sep 25, 2023. FINDINGS: Brain: There is age related atrophy and moderate diffuse cerebral white matter disease. There is no evidence of hemorrhage, mass, acute cortical infarct or hydrocephalus. There is a dystrophic calcification again demonstrated within the pons posterolaterally on the right. There is also prominent dystrophic calcification along the left tentorial leaflet anteriorly, as before. Vascular: Mild to moderate calcific atheromatous disease within the carotid siphons. Skull: Hyperostosis frontalis interna, normal variant. No osseous lesions present. Sinuses/Orbits: Moderate opacification of the right frontal and anterior ethmoid air cells. Status post bilateral lens replacement. Other: None. IMPRESSION: 1. Age-related atrophy and moderate diffuse cerebral  white matter disease. No apparent acute process. 2. Right frontal and ethmoid sinus  disease. Electronically Signed   By: Evalene Coho M.D.   On: 11/28/2023 14:12     Procedures   Medications Ordered in the ED  prochlorperazine  (COMPAZINE ) injection 5 mg (5 mg Intravenous Given 11/28/23 1340)  diphenhydrAMINE  (BENADRYL ) injection 12.5 mg (12.5 mg Intravenous Given 11/28/23 1341)                                    Medical Decision Making Amount and/or Complexity of Data Reviewed Labs: ordered. Radiology: ordered.  Risk Prescription drug management.   [name] is here with generalized weakness, continued leg swelling and shortness of breath abdominal pain headache.  I saw patient earlier last week for similar.  We increase her torsemide  but family states they have not really noticed any major improvement.  She now has headache belly pain.  She states may be some narcotic pain medicines making her feel sick as well.  Family still noticed swelling to the leg swelling to the body.  Increased breathing.  She has a history of blood clots on anticoagulation been compliant.  She has a history of CAD polymyalgia rheumatica anemia CKD CHF.  Differential is wide could be infectious process or ongoing heart failure, seems less likely to be ACS or head bleed or major intra-abdominal process.  Will BROAD workup with CT scan of her head chest abdomen and pelvis check basic labs including troponin and BNP.  EKG shows sinus rhythm.  No ischemic changes.  Will give her headache cocktail of Compazine  and Benadryl  given that her main symptoms are headache and nausea.  Overall CT scan of the chest abdomen and pelvis shows no acute findings per radiology report.  CT of the head shows no acute processes per radiology report.  She has a mild leukocytosis 16 but somewhat has a history of chronic leukocytosis.  Her hemoglobin 7.4.  Lab work is overall unremarkable and at baseline.  BNP is 94.  Troponin 16.   This is better than her baseline as well.  Overall headache is greatly improved.  She has been prescribed Fioricet  in the past for her headaches and will prescribe that as well as Zofran  recommend Benadryl  as needed as well.  At this time just awaiting the urinalysis to decide if we need to do antibiotics for UTI.  Patient was handed off awaiting urinalysis.  Anticipate discharge.  Please see oncoming ED provider's note for further results of urinalysis.  This chart was dictated using voice recognition software.  Despite best efforts to proofread,  errors can occur which can change the documentation meaning.      Final diagnoses:  Weakness  Nausea  Other migraine without status migrainosus, not intractable    ED Discharge Orders          Ordered    ondansetron  (ZOFRAN -ODT) 4 MG disintegrating tablet  Every 8 hours PRN        11/28/23 1440    butalbital -acetaminophen -caffeine  (FIORICET ) 50-325-40 MG tablet  Every 6 hours PRN        11/28/23 1441               Ruthe Cornet, DO 11/28/23 1445

## 2023-11-29 ENCOUNTER — Telehealth: Payer: Self-pay

## 2023-11-29 NOTE — Telephone Encounter (Signed)
 Copied from CRM #[id number]. Topic: General - Other >> Nov 29, 2023 12:33 PM Mesmerise C wrote: Reason for CRM: [name] Engineer, civil (consulting) From [employer] doesn't know how compliant patient is with medications spoke to daughter on 22nd wants nurse to go over with her to follow up with cardiologist as well palliative care and complex disease management to discuss options of care with patient and daughter due to patient going to ER frequently

## 2023-11-30 ENCOUNTER — Other Ambulatory Visit: Payer: Self-pay | Admitting: Hematology

## 2023-11-30 ENCOUNTER — Other Ambulatory Visit: Payer: Self-pay

## 2023-11-30 ENCOUNTER — Other Ambulatory Visit: Payer: Self-pay | Admitting: Family Medicine

## 2023-11-30 DIAGNOSIS — I5032 Chronic diastolic (congestive) heart failure: Secondary | ICD-10-CM

## 2023-11-30 NOTE — Progress Notes (Unsigned)
   11/30/2023  Patient ID: [name], female   DOB: 1935-01-28, 89 y.o.   MRN: [id number]  Outreach attempt to follow-up with patient's daughter (POA/DPR) in regard to [place] Reader replacement.  I was not able to reach [name], but I was able to leave a HIPAA compliant voicemail with my direct phone number and am also sending a MyChart message.  [name], PharmD, DPLA

## 2023-12-03 ENCOUNTER — Encounter (HOSPITAL_COMMUNITY): Payer: Self-pay

## 2023-12-03 ENCOUNTER — Encounter: Payer: Self-pay | Admitting: Hematology

## 2023-12-05 ENCOUNTER — Telehealth

## 2023-12-06 ENCOUNTER — Encounter: Payer: Self-pay | Admitting: Family Medicine

## 2023-12-06 ENCOUNTER — Inpatient Hospital Stay: Admitting: Family Medicine

## 2023-12-06 ENCOUNTER — Ambulatory Visit (INDEPENDENT_AMBULATORY_CARE_PROVIDER_SITE_OTHER): Admitting: Family Medicine

## 2023-12-06 VITALS — BP 128/68 | HR 71 | Temp 97.7°F | Ht 67.0 in | Wt 269.0 lb

## 2023-12-06 DIAGNOSIS — R42 Dizziness and giddiness: Secondary | ICD-10-CM | POA: Diagnosis not present

## 2023-12-06 DIAGNOSIS — Z794 Long term (current) use of insulin: Secondary | ICD-10-CM | POA: Diagnosis not present

## 2023-12-06 DIAGNOSIS — E1140 Type 2 diabetes mellitus with diabetic neuropathy, unspecified: Secondary | ICD-10-CM

## 2023-12-06 DIAGNOSIS — J322 Chronic ethmoidal sinusitis: Secondary | ICD-10-CM

## 2023-12-06 DIAGNOSIS — G4709 Other insomnia: Secondary | ICD-10-CM | POA: Diagnosis not present

## 2023-12-06 DIAGNOSIS — N184 Chronic kidney disease, stage 4 (severe): Secondary | ICD-10-CM | POA: Diagnosis not present

## 2023-12-06 DIAGNOSIS — R519 Headache, unspecified: Secondary | ICD-10-CM | POA: Diagnosis not present

## 2023-12-06 DIAGNOSIS — F411 Generalized anxiety disorder: Secondary | ICD-10-CM

## 2023-12-06 MED ORDER — MECLIZINE HCL 25 MG PO TABS: 25.0000 mg | ORAL_TABLET | Freq: Two times a day (BID) | ORAL | 1 refills | Status: DC

## 2023-12-06 MED ORDER — GABAPENTIN 100 MG PO CAPS: 100.0000 mg | ORAL_CAPSULE | Freq: Every day | ORAL | 3 refills | Status: DC

## 2023-12-06 MED ORDER — MELATONIN 5 MG PO TABS: 5.0000 mg | ORAL_TABLET | Freq: Every evening | ORAL | 0 refills | Status: DC | PRN

## 2023-12-06 MED ORDER — AMOXICILLIN 500 MG PO CAPS: 500.0000 mg | ORAL_CAPSULE | Freq: Two times a day (BID) | ORAL | 0 refills | Status: AC

## 2023-12-06 MED ORDER — AMITRIPTYLINE HCL 75 MG PO TABS
75.0000 mg | ORAL_TABLET | Freq: Every day | ORAL | 0 refills | Status: DC
Start: 2023-12-06 — End: 2024-02-20

## 2023-12-06 MED ORDER — FLUTICASONE PROPIONATE 50 MCG/ACT NA SUSP: 2.0000 | Freq: Every day | NASAL | 6 refills | Status: DC

## 2023-12-06 NOTE — Progress Notes (Unsigned)
 Established Patient Office Visit   Subjective:  Patient ID: [name], female    DOB: July 04, 1934  Age: 89 y.o. MRN: [id number]  Chief Complaint  Patient presents with   Transitions Of Care    No concerns medications something to help you sleep.    HPI Encounter Diagnoses  Name Primary?   CKD (chronic kidney disease), stage IV (HCC) Yes   Vertigo    Nonintractable headache, unspecified chronicity pattern, unspecified headache type    Type 2 diabetes mellitus with diabetic neuropathy, with long-term current use of insulin  (HCC)    GAD (generalized anxiety disorder)    Other insomnia    Chronic ethmoidal sinusitis    For follow-up of recent emergency room visit.  She can continues to feel heaviness in her head.  She does have rhinorrhea.  CT scan of the head did show evidence for chronic sinus disease in her ethmoid and frontal sinuses.  White count was elevated.  She has been afebrile.  Blood work obtained through the emergency room showed multiple issues including leukocytosis, significant chronic normocytic anemia, and a GFR of 14.  She continues to produce urine (urinalysis in the ER was normal) and does not wish to pursue dialysis.  Continues to endorse burning in her legs thought to be associated with diabetic neuropathy.  Ongoing vertigo. {History (Optional):23778}  Review of Systems  Constitutional:  Positive for malaise/fatigue. Negative for chills and fever.  HENT:  Positive for congestion and sinus pain.   Eyes:  Negative for blurred vision, discharge and redness.  Respiratory: Negative.    Cardiovascular: Negative.   Gastrointestinal:  Negative for abdominal pain.  Genitourinary: Negative.   Musculoskeletal: Negative.  Negative for myalgias.  Skin:  Negative for rash.  Neurological:  Positive for dizziness. Negative for tingling, loss of consciousness and weakness.  Endo/Heme/Allergies:  Negative for polydipsia.     Current Outpatient Medications:     albuterol  (PROAIR  HFA) 108 (90 Base) MCG/ACT inhaler, Inhale 1-2 puffs into the lungs every 6 (six) hours as needed for wheezing or shortness of breath., Disp: 6.7 g, Rfl: 1   amoxicillin  (AMOXIL ) 500 MG capsule, Take 1 capsule (500 mg total) by mouth 2 (two) times daily for 10 days., Disp: 20 capsule, Rfl: 0   atorvastatin  (LIPITOR ) 80 MG tablet, TAKE 1 TABLET BY MOUTH EVERY DAY, Disp: 90 tablet, Rfl: 3   azelastine  (ASTELIN ) 0.1 % nasal spray, Place 2 sprays into both nostrils 2 (two) times daily., Disp: 30 mL, Rfl: 12   calcitRIOL  (ROCALTROL ) 0.25 MCG capsule, Take 0.25 mcg by mouth daily., Disp: , Rfl:    carvedilol  (COREG ) 12.5 MG tablet, TAKE 1 TABLET BY MOUTH 2 TIMES DAILY, Disp: 180 tablet, Rfl: 1   cetirizine  (ZYRTEC ) 10 MG tablet, Take 10 mg by mouth daily as needed for allergies or rhinitis., Disp: , Rfl:    ELIQUIS  5 MG TABS tablet, TAKE 1 TABLET BY MOUTH 2 TIMES DAILY, Disp: 180 tablet, Rfl: 3   febuxostat  (ULORIC ) 40 MG tablet, TAKE 1 TABLET BY MOUTH EVERY DAY, Disp: 90 tablet, Rfl: 3   Fluticasone -Umeclidin-Vilant (TRELEGY ELLIPTA ) 100-62.5-25 MCG/ACT AEPB, Inhale 1 puff into the lungs daily., Disp: 1 each, Rfl: 11   folic acid  (FOLVITE ) 1 MG tablet, TAKE 1 TABLET BY MOUTH EVERY DAY, Disp: 90 tablet, Rfl: 1   gabapentin  (NEURONTIN ) 100 MG capsule, Take 1 capsule (100 mg total) by mouth at bedtime., Disp: 30 capsule, Rfl: 3   glucagon  1 MG injection, Inject 1  mg into the vein once as needed for up to 1 dose., Disp: 1 each, Rfl: 12   glucose 4 GM chewable tablet, Chew 1 tablet (4 g total) by mouth as needed for low blood sugar., Disp: 50 tablet, Rfl: 12   hydrOXYzine  (ATARAX ) 25 MG tablet, TAKE 1 TABLET BY MOUTH EVERY 8 HOURS AS NEEDED FOR ANXIETY OR ITCHING (NASAL AND THROAT CONGESTION), Disp: 120 tablet, Rfl: 0   insulin  glargine, 2 Unit Dial , (TOUJEO  MAX SOLOSTAR) 300 UNIT/ML Solostar Pen, Inject 28 units subcutaneously one daily, Disp: 12 mL, Rfl: 0   ipratropium-albuterol  (DUONEB)  0.5-2.5 (3) MG/3ML SOLN, Take 3 mLs by nebulization every 6 (six) hours as needed (cough, wheezing, or shortness of breath)., Disp: 360 mL, Rfl: 0   memantine  (NAMENDA ) 5 MG tablet, TAKE 2 TABLETS BY MOUTH EVERY MORNING and TAKE 1 TABLET EVERY EVENING, Disp: 270 tablet, Rfl: 5   montelukast  (SINGULAIR ) 10 MG tablet, Take 1 tablet (10 mg total) by mouth at bedtime., Disp: 30 tablet, Rfl: 3   nitroGLYCERIN  (NITROSTAT ) 0.4 MG SL tablet, Dissolve 1 tablet under the tongue every 5 minutes as needed for chest pain. Max of 3 doses, then 911., Disp: 25 tablet, Rfl: 6   NOVOLOG  FLEXPEN 100 UNIT/ML FlexPen, INJECT 14 UNITS SUBCUTANEOUSLY TWICE DAILY WITH A MEAL . DO NOT EXCEED 70 PER 24 HOURS, Disp: 15 mL, Rfl: 0   nystatin  (MYCOSTATIN /NYSTOP ) powder, Apply 1 Application topically 3 (three) times daily., Disp: 60 g, Rfl: 3   ondansetron  (ZOFRAN -ODT) 4 MG disintegrating tablet, Take 1 tablet (4 mg total) by mouth every 8 (eight) hours as needed for nausea or vomiting., Disp: 20 tablet, Rfl: 0   pantoprazole  (PROTONIX ) 20 MG tablet, TAKE 1 TABLET BY MOUTH ONCE DAILY, Disp: 30 tablet, Rfl: 5   ranolazine  (RANEXA ) 500 MG 12 hr tablet, Take 1 tablet (500 mg total) by mouth 2 (two) times daily., Disp: 60 tablet, Rfl: 1   Semaglutide  (RYBELSUS ) 7 MG TABS, Take 1 tablet (7 mg total) by mouth daily., Disp: 30 tablet, Rfl: 1   torsemide  (DEMADEX ) 20 MG tablet, TAKE 1 TABLET BY MOUTH EVERY DAY MAY TAKE AN EXTRA DOES FOR WEIGHT GAIN OF 3LBs IN 1 DAY OR 5LBS IN 1 WEEK OR FOR WORSENING SWELLING, Disp: 30 tablet, Rfl: 1   TYLENOL  500 MG tablet, Take 1,000 mg by mouth every 6 (six) hours as needed for mild pain or headache., Disp: , Rfl:    TYLENOL  PM EXTRA STRENGTH 500-25 MG TABS tablet, Take 2 tablets by mouth at bedtime., Disp: , Rfl:    Vitamin D , Ergocalciferol , (DRISDOL ) 1.25 MG (50000 UNIT) CAPS capsule, TAKE 1 CAPSULE BY MOUTH ONCE WEEKLY ON MONDAY, Disp: 4 capsule, Rfl: 1   amitriptyline  (ELAVIL ) 75 MG tablet, Take 1  tablet (75 mg total) by mouth at bedtime., Disp: 90 tablet, Rfl: 0   Continuous Glucose Receiver (FREESTYLE LIBRE 3 READER) DEVI, Continuous glucose monitor (Patient not taking: Reported on 11/21/2023), Disp: 1 each, Rfl: 0   Continuous Glucose Sensor (FREESTYLE LIBRE 14 DAY SENSOR) MISC, PLACE 1 DEVICE ON THE SKIN AS DIRECTED EVERY 14 DAYS (Patient not taking: Reported on 11/21/2023), Disp: 6 each, Rfl: 3   diclofenac  Sodium (VOLTAREN ) 1 % GEL, Apply 2 g topically 4 (four) times daily. (Patient taking differently: Apply 2 g topically 4 (four) times daily as needed (pain).), Disp: 150 g, Rfl: 2   fluticasone  (FLONASE ) 50 MCG/ACT nasal spray, Place 2 sprays into both nostrils daily., Disp: 16 g,  Rfl: 6   meclizine  (ANTIVERT ) 25 MG tablet, Take 1 tablet (25 mg total) by mouth 2 (two) times daily., Disp: 180 tablet, Rfl: 1   melatonin 5 MG TABS, Take 1 tablet (5 mg total) by mouth at bedtime as needed., Disp: 15 tablet, Rfl: 0   predniSONE  (DELTASONE ) 10 MG tablet, Take 4 tablets (40 mg total) by mouth daily with breakfast. (Patient not taking: Reported on 12/06/2023), Disp: 120 tablet, Rfl: 6   Objective:     BP 128/68 (BP Location: Right Arm, Patient Position: Sitting, Cuff Size: Normal)   Pulse 71   Temp 97.7 F (36.5 C) (Temporal)   Ht 5' 7 (1.702 m)   Wt 269 lb (122 kg)   SpO2 98%   BMI 42.13 kg/m  {Vitals History (Optional):23777}  Physical Exam Constitutional:      General: She is not in acute distress.    Appearance: Normal appearance. She is not ill-appearing, toxic-appearing or diaphoretic.  HENT:     Head: Normocephalic and atraumatic.     Right Ear: External ear normal.     Left Ear: External ear normal.  Eyes:     General: No scleral icterus.       Right eye: No discharge.        Left eye: No discharge.     Extraocular Movements: Extraocular movements intact.     Conjunctiva/sclera: Conjunctivae normal.  Cardiovascular:     Rate and Rhythm: Normal rate and regular  rhythm.  Pulmonary:     Effort: Pulmonary effort is normal. No respiratory distress.     Breath sounds: No wheezing or rales.  Skin:    General: Skin is warm and dry.  Neurological:     Mental Status: She is alert and oriented to person, place, and time.  Psychiatric:        Mood and Affect: Mood normal.        Behavior: Behavior normal.      No results found for any visits on 12/06/23.  {Labs (Optional):23779}  The ASCVD Risk score (Arnett DK, et al., 2019) failed to calculate for the following reasons:   The 2019 ASCVD risk score is only valid for ages 77 to 18   Risk score cannot be calculated because patient has a medical history suggesting prior/existing ASCVD    Assessment & Plan:   CKD (chronic kidney disease), stage IV (HCC)  Vertigo -     Meclizine  HCl; Take 1 tablet (25 mg total) by mouth 2 (two) times daily.  Dispense: 180 tablet; Refill: 1  Nonintractable headache, unspecified chronicity pattern, unspecified headache type -     Meclizine  HCl; Take 1 tablet (25 mg total) by mouth 2 (two) times daily.  Dispense: 180 tablet; Refill: 1  Type 2 diabetes mellitus with diabetic neuropathy, with long-term current use of insulin  (HCC) -     Amitriptyline  HCl; Take 1 tablet (75 mg total) by mouth at bedtime.  Dispense: 90 tablet; Refill: 0 -     Gabapentin ; Take 1 capsule (100 mg total) by mouth at bedtime.  Dispense: 30 capsule; Refill: 3  GAD (generalized anxiety disorder) -     Amitriptyline  HCl; Take 1 tablet (75 mg total) by mouth at bedtime.  Dispense: 90 tablet; Refill: 0  Other insomnia -     Amitriptyline  HCl; Take 1 tablet (75 mg total) by mouth at bedtime.  Dispense: 90 tablet; Refill: 0 -     Melatonin; Take 1 tablet (5 mg total) by mouth  at bedtime as needed.  Dispense: 15 tablet; Refill: 0  Chronic ethmoidal sinusitis -     Amoxicillin ; Take 1 capsule (500 mg total) by mouth 2 (two) times daily for 10 days.  Dispense: 20 capsule; Refill: 0 -      Fluticasone  Propionate; Place 2 sprays into both nostrils daily.  Dispense: 16 g; Refill: 6    Return Schedule follow-up with Dr. Sebastian in the next 3 to 4 weeks.SABRA Elsie Sim Berneta, MD

## 2023-12-06 NOTE — Transitions of Care (Post Inpatient/ED Visit) (Unsigned)
   12/06/2023  Name: [name] MRN: [id number] DOB: Dec 20, 1934  Today's TOC FU Call Status: Today's TOC FU Call Status:: Successful TOC FU Call Completed Patient's Name and Date of Birth confirmed.  Transition Care Management Follow-up Telephone Call Date of Discharge: 11/28/23 Discharge Facility: [facility]) Type of Discharge: Emergency Department How have you been since you were released from the hospital?: Better Any questions or concerns?: No  Items Reviewed: Did you receive and understand the discharge instructions provided?: Yes Medications obtained,verified, and reconciled?: Yes (Medications Reviewed) Any new allergies since your discharge?: No Dietary orders reviewed?: NA Do you have support at home?: Yes  Medications Reviewed Today: Medications Reviewed Today   Medications were not reviewed in this encounter     Home Care and Equipment/Supplies: Were Home Health Services Ordered?: NA Any new equipment or medical supplies ordered?: NA  Functional Questionnaire: Do you need assistance with bathing/showering or dressing?: Yes Do you need assistance with meal preparation?: Yes Do you need assistance with eating?: Yes Do you have difficulty maintaining continence: Yes Do you need assistance with getting out of bed/getting out of a chair/moving?: Yes Do you have difficulty managing or taking your medications?: Yes  Follow up appointments reviewed: PCP Follow-up appointment confirmed?: Yes Date of PCP follow-up appointment?: 12/06/23 Follow-up Provider: Dr. Berneta Specialist [facility] Follow-up appointment confirmed?: NA Do you need transportation to your follow-up appointment?: No Do you understand care options if your condition(s) worsen?: Yes-patient verbalized understanding   [name], CMA  SIGNATURE***

## 2023-12-07 ENCOUNTER — Telehealth: Payer: Self-pay

## 2023-12-07 ENCOUNTER — Ambulatory Visit: Admitting: Family Medicine

## 2023-12-07 NOTE — Progress Notes (Addendum)
   12/07/2023  Patient ID: Jackquelyn CHRISTELLA Gaskins, female   DOB: Jul 25, 1934, 89 y.o.   MRN: [id number]  Subjective/Objective Outreach to follow-up with patient's daughter (POA/DPR) on management of diabetes    Diabetes Management -Current medications:  Toujeo  Max 22 units BID, Novolog  14 units BID with meals, Rybelsus  7mg  daily -Patient previously using Ozempic , but medication caused fatigue and GI upset, so Rybelsus  7mg  was prescribed.  Patient stopped taking Rybelsus  for a week or so due to concern with potential side effects (was not experiencing), but she has now resumed taking. -Toujeo  prescribed as 28 units daily, but daughter reports patient using 22 units BID -Libre 3 reader could not be replaced by Abbott, so daughter is using mobile app with new sensor placed today -FBG 121 this morning -A1c 9.4% on 5/20   Assessment/Plan   Diabetes Management -Confirm insulin  regimen with Dr. Sam at upcoming visit -Patient sees Dr. Sam with endocrinology next week and will take reader to appointment if further assistance is needed  -Due for A1c 8/20   Follow-up:  2 weeks   [name], PharmD, DPLA

## 2023-12-10 ENCOUNTER — Other Ambulatory Visit: Payer: Self-pay

## 2023-12-10 NOTE — Patient Instructions (Signed)
 Visit Information  Thank you for taking time to visit with me today. Please don't hesitate to contact me if I can be of assistance to you before our next scheduled appointment.  Your next care management appointment is by telephone on 01/16/24 at 10 am  Please call the care guide team at [phone] if you need to cancel, schedule, or reschedule an appointment.   Please call the Suicide and Crisis Lifeline: 988 call the [place]  National Suicide Prevention Lifeline: 253-416-7000 or TTY: 434-775-7947 TTY 7600996325) to talk to a trained counselor call 1-800-273-TALK (toll free, 24 hour hotline) if you are experiencing a Mental Health or Behavioral Health Crisis or need someone to talk to.  Heddy Shutter, RN, MSN, BSN, CCM [place]  [facility], Population Health Case Manager Phone: [phone]

## 2023-12-10 NOTE — Patient Outreach (Signed)
 Complex Care Management   Visit Note  12/10/2023  Name:  [name] MRN: [id number] DOB: [birth year]  Situation: Referral received for Complex Care Management related to Diabetes with Complications I obtained verbal consent from [name], daughter (DPR).  Visit completed with [name]( DPR) on the phone  Background:   Past Medical History:  Diagnosis Date   Abdominal pain 01/26/2012   Acute deep vein thrombosis (DVT) of femoral vein of right lower extremity (HCC) 03/26/2022   Acute kidney injury superimposed on chronic kidney disease (HCC) 12/18/2014   Acute upper GI bleed 03/26/2022   Allergy     takes Mucinex  daily as needed   Anemia, unspecified    Anxiety    takes Clonazepam  daily as needed   Anxiety associated with depression 06/08/2017   Arthritis    Breast cancer (HCC)    Breast cancer screening 02/19/2014   Breast mass 10/27/2014   Chronic diastolic CHF (congestive heart failure) (HCC)    takes Furosemide  daily   Chronic pain syndrome 02/06/2015   CKD (chronic kidney disease), stage III (HCC)    Coronary artery disease    a. s/p IMI 2004 tx with BMS to RCA;  b. s/p Promus DES to RCA 2/12 c. abnormal nuc 2016 -> cath with med rx. d. NSTEMI 02/2020  mv CAD with severe ISR in pRCA and m/dRCA lesion both treated with DES.   Debility 08/01/2012   Dementia (HCC)    Depression    takes Cymbalta  daily   Diabetes mellitus    insulin  daily   DOE (dyspnea on exertion) 06/24/2014   Dysphagia, unspecified(787.20) 07/31/2012   Fungal dermatitis 11/13/2007   Qualifier: Diagnosis of  By: Jame  MD, Maude FALCON    GERD (gastroesophageal reflux disease)    takes Protonix  daily   Gout, unspecified    Headache    occasionally   History of anemia due to chronic kidney disease 03/26/2022   History of blood clots    History of deep venous thrombosis 01/27/2012   History of pulmonary embolus (PE) 04/22/2014   Hyperkalemia 07/26/2017   Hyperlipidemia    takes Pravastatin  daily    Hypertension    Insomnia    Joint pain    Joint swelling    Morbid obesity (HCC)    Multiple benign nevi 11/15/2016   Myocardial infarction [facility]) 2004   Non-STEMI (non-ST elevated myocardial infarction) (HCC) 02/15/2020   Nonintractable headache 01/07/2008   Qualifier: Diagnosis of   By: Jame  MD, Maude FALCON        Numbness    Obstructive sleep apnea    does not wear cpap   Osteoarthritis    Osteoarthritis    Osteoarthrosis, unspecified whether generalized or localized, lower leg    Pain in the chest 12/31/2013   Pain, chronic    Personal history of other diseases of the digestive system 12/03/2006   Qualifier: Diagnosis of  By: Jame  MD, Maude FALCON    Polymyalgia rheumatica [facility])    Presence of stent in right coronary artery    Pulmonary emboli (HCC) 01/2012   felt to need lifelong anticoagulation but Xarelto  dc in 02/2020 due to need for DAPT   Situational depression 06/04/2009   Qualifier: Diagnosis of  By: Jame  MD, Maude FALCON    Syncope, vasovagal 07/04/2021   Type 2 diabetes mellitus with hyperlipidemia (HCC) 02/16/2022   Urinary incontinence    takes Linzess  daily   Vaginitis and vulvovaginitis 06/23/2016   Vertigo  hx of;was taking Meclizine  if needed   Wears dentures     Assessment: Patient Reported Symptoms:  Cognitive Cognitive Status: Requires Assistance Decision Making (Patient with history of dementia.  Daughter assist patient in managing her healthcare needs.)      Neurological Neurological Review of Symptoms: Numbness (Daughter reports that patient having numbness in her feet, legs, and calfs.  Per chart review patient has neuropathy. Patient taking Neurontin .)    HEENT HEENT Symptoms Reported: No symptoms reported      Cardiovascular Cardiovascular Symptoms Reported: Swelling in legs or feet (Patient's daughter reports swelling in legs bilaterally. Reinterated purpose of diuretic torsemide . Advised to continue to monitor for  signs/symptoms of fluid volume overload.  Reviewed prn torsemide  dosing (Per medication list)) Cardiovascular Management Strategies: Medication therapy, Adequate rest, Routine screening Weight:  (Daughter states unable to stand patient to obtain daily weights.) Cardiovascular Comment: Advised to elevate legs when patient is sitting  Respiratory Respiratory Symptoms Reported: No symptoms reported    Endocrine Endocrine Symptoms Reported: Hypoglycemia Is patient diabetic?: Yes Is patient checking blood sugars at home?: Yes (Patient's daughter reports that she is checking patient's blood sugar at home.) List most recent blood sugar readings, include date and time of day: BS-127 fasting today. Daughter reports patient's BS was 1 on 12-08-23. Treated with glucose tablets and apple juice. BS increased after 15 minutes. RNCM discussed rule of 15 for treatment of hypogylcemia. Endocrine Self-Management Outcome: 3 (uncertain) Endocrine Comment: During medication review discrepancy noted with Toujeo  and Novolog  flex pen. Patient has upcoming appointment on 12-12-23 with Endocrinologist. Daughter states will clarify at this appointment.  Gastrointestinal Gastrointestinal Symptoms Reported: No symptoms reported      Genitourinary Genitourinary Symptoms Reported: No symptoms reported    Integumentary Integumentary Symptoms Reported: No symptoms reported    Musculoskeletal Musculoskelatal Symptoms Reviewed: Limited mobility Additional Musculoskeletal Details: Daughter reports limited mobility. Daughter states patient is unable to walk. Daughter pivots patient to a chair with one person assist.        Psychosocial Psychosocial Symptoms Reported: Not assessed (Unable to access.  Assessment completed with patient's daughter.)     Quality of Family Relationships: involved, supportive      10/09/2023    3:12 PM  Depression screen PHQ 2/9  Decreased Interest 3  Down, Depressed, Hopeless 3  PHQ - 2 Score  6  Altered sleeping 3  Tired, decreased energy 3  Change in appetite 0  Feeling bad or failure about yourself  3  Trouble concentrating 3  Moving slowly or fidgety/restless 0  Suicidal thoughts 0  PHQ-9 Score 18  Difficult doing work/chores Extremely dIfficult    There were no vitals filed for this visit.  Medications Reviewed Today     Reviewed by Issak Goley M, RN (Registered Nurse) on 12/10/23 at 1535  Med List Status: <None>   Medication Order Taking? Sig Documenting Provider Last Dose Status Informant  albuterol  (PROAIR  HFA) 108 (90 Base) MCG/ACT inhaler [id number] Yes Inhale 1-2 puffs into the lungs every 6 (six) hours as needed for wheezing or shortness of breath. Caro Harlene POUR, NP  Active Family Member, Pharmacy Records           Med Note [facility] [place], [place]   Fri Oct 05, 2023  4:03 PM)    amitriptyline  (ELAVIL ) 75 MG tablet [id number] Yes Take 1 tablet (75 mg total) by mouth at bedtime. Berneta Elsie Sayre, MD  Active   amoxicillin  (AMOXIL ) 500 MG capsule [id number] Yes Take 1  capsule (500 mg total) by mouth 2 (two) times daily for 10 days. Berneta Elsie Sayre, MD  Active   atorvastatin  (LIPITOR ) 80 MG tablet [id number] Yes TAKE 1 TABLET BY MOUTH EVERY DAY Sebastian Beverley NOVAK, MD  Active   azelastine  (ASTELIN ) 0.1 % nasal spray [id number] Yes Place 2 sprays into both nostrils 2 (two) times daily. Sebastian Beverley NOVAK, MD  Active Family Member, Pharmacy Records  calcitRIOL  (ROCALTROL ) 0.25 MCG capsule [id number]  Take 0.25 mcg by mouth daily.  Patient not taking: Reported on 12/10/2023   [provider]  Active Family Member, Pharmacy Records  carvedilol  (COREG ) 12.5 MG tablet [id number] Yes TAKE 1 TABLET BY MOUTH 2 TIMES DAILY Sebastian Beverley NOVAK, MD  Active   cetirizine  (ZYRTEC ) 10 MG tablet [id number] Yes Take 10 mg by mouth daily as needed for allergies or rhinitis. [provider]  Active Family Member, Pharmacy Records  Continuous Glucose Receiver  (FREESTYLE LIBRE 3 READER) DEVI [id number]  Continuous glucose monitor  Patient not taking: Reported on 11/21/2023   Shamleffer, Donell Cardinal, MD  Active Family Member, Pharmacy Records  Continuous Glucose Sensor (FREESTYLE LIBRE 14 [place]) [place] [id number] Yes PLACE 1 DEVICE ON THE SKIN AS DIRECTED EVERY 14 DAYS Shamleffer, Donell Cardinal, MD  Active Family Member, Pharmacy Records  diclofenac  Sodium (VOLTAREN ) 1 % GEL [id number] Yes Apply 2 g topically 4 (four) times daily. Sebastian Beverley NOVAK, MD  Active Family Member, Pharmacy Records  ELIQUIS  5 MG TABS tablet [id number] Yes TAKE 1 TABLET BY MOUTH 2 TIMES DAILY Sebastian Beverley NOVAK, MD  Active Family Member, Pharmacy Records  febuxostat  (ULORIC ) 40 MG tablet [id number] Yes TAKE 1 TABLET BY MOUTH EVERY DAY Sebastian Beverley NOVAK, MD  Active Family Member, Pharmacy Records  fluticasone  (FLONASE ) 50 MCG/ACT nasal spray [id number] Yes Place 2 sprays into both nostrils daily. Berneta Elsie Sayre, MD  Active   Fluticasone -Umeclidin-Vilant (TRELEGY ELLIPTA ) 100-62.5-25 MCG/ACT AEPB [id number] Yes Inhale 1 puff into the lungs daily. Sebastian Beverley NOVAK, MD  Active Family Member, Pharmacy Records  folic acid  (FOLVITE ) 1 MG tablet [id number] Yes TAKE 1 TABLET BY MOUTH EVERY DAY Sebastian Beverley NOVAK, MD  Active   gabapentin  (NEURONTIN ) 100 MG capsule [id number] Yes Take 1 capsule (100 mg total) by mouth at bedtime. Berneta Elsie Sayre, MD  Active   glucagon  1 MG injection [id number] Yes Inject 1 mg into the vein once as needed for up to 1 dose. Sebastian Beverley NOVAK, MD  Active Family Member, Pharmacy Records           Med Note [facility] [place], [place]   Fri Oct 05, 2023  4:04 PM)    glucose 4 GM chewable tablet [id number] Yes Chew 1 tablet (4 g total) by mouth as needed for low blood sugar. Sebastian Beverley NOVAK, MD  Active Family Member, Pharmacy Records  hydrOXYzine  (ATARAX ) 25 MG tablet [id number] Yes TAKE 1 TABLET BY MOUTH EVERY 8 HOURS AS NEEDED FOR ANXIETY OR ITCHING  (NASAL AND THROAT CONGESTION) Sebastian Beverley NOVAK, MD  Active Family Member, Pharmacy Records  insulin  glargine, 2 Unit Dial , (TOUJEO  MAX SOLOSTAR) 300 UNIT/ML Solostar Pen [id number] Yes Inject 28 units subcutaneously one daily  Patient taking differently: Inject 28 units subcutaneously one daily   Shamleffer, Ibtehal Jaralla, MD  Active   ipratropium-albuterol  (DUONEB) 0.5-2.5 (3) MG/3ML SOLN [id number] Yes Take 3 mLs by nebulization every 6 (six) hours as needed (cough, wheezing, or shortness of breath). Sebastian Beverley NOVAK, MD  Active Family Member, Pharmacy Records  meclizine  (ANTIVERT ) 25 MG tablet [id number] Yes Take 1 tablet (25 mg total) by mouth 2 (two) times daily. Berneta Elsie Sayre, MD  Active   melatonin 5 MG TABS [id number] Yes Take 1 tablet (5 mg total) by mouth at bedtime as needed. Berneta Elsie Sayre, MD  Active   memantine  (NAMENDA ) 5 MG tablet [id number] Yes TAKE 2 TABLETS BY MOUTH EVERY MORNING and TAKE 1 TABLET EVERY EVENING Sebastian Beverley NOVAK, MD  Active Family Member, Pharmacy Records  montelukast  (SINGULAIR ) 10 MG tablet [id number] Yes Take 1 tablet (10 mg total) by mouth at bedtime. Sebastian Beverley NOVAK, MD  Active Family Member, Pharmacy Records           Med Note [facility] [place], [place]   Fri Oct 05, 2023  4:03 PM)    nitroGLYCERIN  (NITROSTAT ) 0.4 MG SL tablet [id number] Yes Dissolve 1 tablet under the tongue every 5 minutes as needed for chest pain. Max of 3 doses, then 911. Nahser, Aleene PARAS, MD  Active Family Member, Pharmacy Records  NOVOLOG  FLEXPEN 100 UNIT/ML FlexPen [id number] Yes INJECT 14 UNITS SUBCUTANEOUSLY TWICE DAILY WITH A MEAL . DO NOT EXCEED 70 PER 24 HOURS  Patient taking differently: INJECT 14 UNITS SUBCUTANEOUSLY TWICE DAILY WITH A MEAL . DO NOT EXCEED 70 PER 24 HOURS   Shamleffer, Ibtehal Jaralla, MD  Active   nystatin  (MYCOSTATIN /NYSTOP ) powder [id number] Yes Apply 1 Application topically 3 (three) times daily. Billy Knee, FNP  Active Family Member,  Pharmacy Records  ondansetron  (ZOFRAN -ODT) 4 MG disintegrating tablet [id number] Yes Take 1 tablet (4 mg total) by mouth every 8 (eight) hours as needed for nausea or vomiting. Ruthe Cornet, DO  Active   pantoprazole  (PROTONIX ) 20 MG tablet [id number] Yes TAKE 1 TABLET BY MOUTH ONCE DAILY Sebastian Beverley NOVAK, MD  Active Family Member, Pharmacy Records  predniSONE  (DELTASONE ) 10 MG tablet [id number]  Take 4 tablets (40 mg total) by mouth daily with breakfast.  Patient not taking: Reported on 12/10/2023   Onita Duos, MD  Active Family Member, Pharmacy Records  ranolazine  (RANEXA ) 500 MG 12 hr tablet [id number] Yes Take 1 tablet (500 mg total) by mouth 2 (two) times daily. Fairy Frames, MD  Active   Semaglutide  (RYBELSUS ) 7 MG TABS [id number] Yes Take 1 tablet (7 mg total) by mouth daily. Sebastian Beverley NOVAK, MD  Active Family Member, Pharmacy Records  torsemide  (DEMADEX ) 20 MG tablet [id number] Yes TAKE 1 TABLET BY MOUTH EVERY DAY MAY TAKE AN EXTRA DOES FOR WEIGHT GAIN OF 3LBs IN 1 DAY OR 5LBS IN 1 WEEK OR FOR WORSENING SWELLING Curatolo, Adam, DO  Active   TYLENOL  500 MG tablet [id number] Yes Take 1,000 mg by mouth every 6 (six) hours as needed for mild pain or headache. [provider]  Active Family Member, Pharmacy Records  TYLENOL  PM EXTRA STRENGTH 500-25 MG TABS tablet [id number] Yes Take 2 tablets by mouth at bedtime. [provider]  Active Family Member, Pharmacy Records  Vitamin D , Ergocalciferol , (DRISDOL ) 1.25 MG (50000 UNIT) CAPS capsule [id number] Yes TAKE 1 CAPSULE BY MOUTH ONCE WEEKLY ON MONDAY Onesimo Emaline Brink, MD  Active   Med List Note Octavia Chrissie MATSU, RN 06/15/22 1332): MR 10/01/22.          Recommendation:   Specialty provider follow-up Endocrinologist 12-12-23 Daughter to contact PCP and schedule for follow up appointment due around 01-03-24.  Follow Up Plan:   Daughter states unavailable till the 2nd week in September. Follow-up scheduled for 01-16-24 at  10:00 am.   PCP and Endocrinology updated on medication discrepancy.   Heddy Shutter, RN, MSN, BSN, CCM [place]  [facility], Population Health Case Manager Phone: [phone]

## 2023-12-11 ENCOUNTER — Ambulatory Visit: Admitting: Family Medicine

## 2023-12-12 ENCOUNTER — Encounter: Payer: Self-pay | Admitting: Internal Medicine

## 2023-12-12 ENCOUNTER — Other Ambulatory Visit: Payer: Self-pay | Admitting: Family Medicine

## 2023-12-12 ENCOUNTER — Ambulatory Visit (INDEPENDENT_AMBULATORY_CARE_PROVIDER_SITE_OTHER): Admitting: Internal Medicine

## 2023-12-12 VITALS — BP 116/78 | HR 64 | Ht 67.0 in | Wt 268.0 lb

## 2023-12-12 DIAGNOSIS — N184 Chronic kidney disease, stage 4 (severe): Secondary | ICD-10-CM

## 2023-12-12 DIAGNOSIS — E1165 Type 2 diabetes mellitus with hyperglycemia: Secondary | ICD-10-CM | POA: Diagnosis not present

## 2023-12-12 DIAGNOSIS — I5032 Chronic diastolic (congestive) heart failure: Secondary | ICD-10-CM

## 2023-12-12 DIAGNOSIS — E1122 Type 2 diabetes mellitus with diabetic chronic kidney disease: Secondary | ICD-10-CM | POA: Diagnosis not present

## 2023-12-12 DIAGNOSIS — Z794 Long term (current) use of insulin: Secondary | ICD-10-CM

## 2023-12-12 DIAGNOSIS — E1140 Type 2 diabetes mellitus with diabetic neuropathy, unspecified: Secondary | ICD-10-CM | POA: Diagnosis not present

## 2023-12-12 LAB — POCT GLYCOSYLATED HEMOGLOBIN (HGB A1C): Hemoglobin A1C: 7.4 % — AB (ref 4.0–5.6)

## 2023-12-12 MED ORDER — FREESTYLE LIBRE 3 PLUS SENSOR MISC: 1.0000 | 3 refills | Status: DC

## 2023-12-12 MED ORDER — FREESTYLE LIBRE 3 SENSOR MISC
1.0000 | 3 refills | Status: DC
Start: 2023-12-12 — End: 2023-12-26

## 2023-12-12 MED ORDER — TOUJEO MAX SOLOSTAR 300 UNIT/ML ~~LOC~~ SOPN: 40.0000 [IU] | PEN_INJECTOR | Freq: Every day | SUBCUTANEOUS | 3 refills | Status: DC

## 2023-12-12 NOTE — Patient Instructions (Addendum)
-   Decrease Toujeo  40 units  ONCE daily  - Rybelsus  7 mg , 1 tablet every morning  -Novolog    140-199 (2units) 200-250 (6 units) 251-299 (8 units) 300-349 (12 units) 350 and above (14 units).    HOW TO TREAT LOW BLOOD SUGARS (Blood sugar LESS THAN 70 MG/DL) Please follow the RULE OF 15 for the treatment of hypoglycemia treatment (when your (blood sugars are less than 70 mg/dL)   STEP 1: Take 15 grams of carbohydrates when your blood sugar is low, which includes:  3-4 GLUCOSE TABS  OR 3-4 OZ OF JUICE OR REGULAR SODA OR ONE TUBE OF GLUCOSE GEL    STEP 2: RECHECK blood sugar in 15 MINUTES STEP 3: If your blood sugar is still low at the 15 minute recheck --> then, go back to

## 2023-12-12 NOTE — Progress Notes (Signed)
 Name: [name]  Age/ Sex: 89 y.o., female   MRN/ DOB: [id number], [birth year]     PCP: Sebastian Beverley NOVAK, MD   Reason for Endocrinology Evaluation: Type 2 Diabetes Mellitus     Initial Endocrinology Clinic Visit: 08/19/2018    PATIENT IDENTIFIER: [name] is a 89 y.o. female with a past medical history of T2DM, CKD III, and chronic pain syndrome . The patient has followed with Endocrinology clinic since 08/19/2018 for consultative assistance with management of her diabetes.  DIABETIC HISTORY:  [name] was diagnosed with T2DM > 40 yrs ago. Pt is a poor historian and unable to recall oral glycemic agents. She has been on insulin  therapy for years. Her hemoglobin A1c has ranged from 6.7%  in 07/2017, peaking at 12.1% in 2018.   Ozempic  was switched to Rybelsus  by her PCP due to GI side effects to Ozempic   SUBJECTIVE:   During the last visit (2/13/23025): A1c 7.8 %    Today (12/12/2023): [name] is here for a follow up visit on diabetes management.  She is accompanied by her daughter and granddaughter.  They use the CGM at home . She has  been noted with hypoglycemia on freestyle libre .  She is symptomatic with these episodes  The patient has had 2 visits to the ED in July, 2025.  Initially for lower extremity edema and shortness of breath, BNP and troponin were elevated, she was given IV Lasix  and torsemide  increased to twice daily The second ED visit was for weakness and abdominal pain CT imaging did not reveal any acute process in the abdomen nor the head She follows with nephrology for CKD   She was seen by podiatry 06/13/2023 She also had a follow-up with cardiology  for CHF She had a follow-up with oncology for history of left sided invasive ductal carcinoma  Has noted occasiona nausea but no vomiting  Has chronic  constipation - on Ducloax   Her daughter is on the regimen was changed at discharge from the hospital  HOME DIABETES REGIMEN:  Toujeo  36   units daily - takes 22 units BID Novolog  14 units with Brunch and 14 units with Supper - not taking  Rybelsus  7 mg daily   CF as below   140-199 (2units) 200-250 (6 units) 251-299 (8 units) 300-349 (12 units) 350 and above (14 units).    CONTINUOUS GLUCOSE MONITORING RECORD INTERPRETATION   Dates of Recording: 7/24-12/12/2023 Sensor description:freestyle libre3  Results statistics:   CGM use % of time 55  Average and SD 116/30.8  Time in range 87 %  % Time Above 180 4  % Time above 250 0  % Time Below target 7     Glycemic patterns summary: BGs have been optimal throughout the day and night Hyperglycemic episodes postprandial  Hypoglycemic episodes occurred at night  Overnight periods: trends down         HISTORY:  Past Medical History:  Past Medical History:  Diagnosis Date  . Abdominal pain 01/26/2012  . Acute deep vein thrombosis (DVT) of femoral vein of right lower extremity (HCC) 03/26/2022  . Acute kidney injury superimposed on chronic kidney disease (HCC) 12/18/2014  . Acute upper GI bleed 03/26/2022  . Allergy     takes Mucinex  daily as needed  . Anemia, unspecified   . Anxiety    takes Clonazepam  daily as needed  . Anxiety associated with depression 06/08/2017  . Arthritis   .  Breast cancer (HCC)   . Breast cancer screening 02/19/2014  . Breast mass 10/27/2014  . Chronic diastolic CHF (congestive heart failure) (HCC)    takes Furosemide  daily  . Chronic pain syndrome 02/06/2015  . CKD (chronic kidney disease), stage III (HCC)   . Coronary artery disease    a. s/p IMI 2004 tx with BMS to RCA;  b. s/p Promus DES to RCA 2/12 c. abnormal nuc 2016 -> cath with med rx. d. NSTEMI 02/2020  mv CAD with severe ISR in pRCA and m/dRCA lesion both treated with DES.  . Debility 08/01/2012  . Dementia (HCC)   . Depression    takes Cymbalta  daily  . Diabetes mellitus    insulin  daily  . DOE (dyspnea on exertion) 06/24/2014  . Dysphagia,  unspecified(787.20) 07/31/2012  . Fungal dermatitis 11/13/2007   Qualifier: Diagnosis of  By: Jame  MD, Maude FALCON   . GERD (gastroesophageal reflux disease)    takes Protonix  daily  . Gout, unspecified   . Headache    occasionally  . History of anemia due to chronic kidney disease 03/26/2022  . History of blood clots   . History of deep venous thrombosis 01/27/2012  . History of pulmonary embolus (PE) 04/22/2014  . Hyperkalemia 07/26/2017  . Hyperlipidemia    takes Pravastatin  daily  . Hypertension   . Insomnia   . Joint pain   . Joint swelling   . Morbid obesity (HCC)   . Multiple benign nevi 11/15/2016  . Myocardial infarction (HCC) 2004  . Non-STEMI (non-ST elevated myocardial infarction) (HCC) 02/15/2020  . Nonintractable headache 01/07/2008   Qualifier: Diagnosis of   By: Jame  MD, Maude FALCON       . Numbness   . Obstructive sleep apnea    does not wear cpap  . Osteoarthritis   . Osteoarthritis   . Osteoarthrosis, unspecified whether generalized or localized, lower leg   . Pain in the chest 12/31/2013  . Pain, chronic   . Personal history of other diseases of the digestive system 12/03/2006   Qualifier: Diagnosis of  By: Jame  MD, Maude FALCON   . Polymyalgia rheumatica (HCC)   . Presence of stent in right coronary artery   . Pulmonary emboli (HCC) 01/2012   felt to need lifelong anticoagulation but Xarelto  dc in 02/2020 due to need for DAPT  . Situational depression 06/04/2009   Qualifier: Diagnosis of  By: Jame  MD, Maude FALCON   . Syncope, vasovagal 07/04/2021  . Type 2 diabetes mellitus with hyperlipidemia (HCC) 02/16/2022  . Urinary incontinence    takes Linzess  daily  . Vaginitis and vulvovaginitis 06/23/2016  . Vertigo    hx of;was taking Meclizine  if needed  . Wears dentures    Past Surgical History:  Past Surgical History:  Procedure Laterality Date  . ABDOMINAL HYSTERECTOMY     partial  . APPENDECTOMY    . blood clots/legs and lungs   2013  . BREAST BIOPSY Left 07/22/2014  . BREAST BIOPSY Left 02/10/2013  . BREAST LUMPECTOMY Left 11/05/2014  . BREAST LUMPECTOMY WITH RADIOACTIVE SEED LOCALIZATION Left 11/05/2014   Procedure: LEFT BREAST LUMPECTOMY WITH RADIOACTIVE SEED LOCALIZATION;  Surgeon: Vicenta Poli, MD;  Location: [facility] OR;  Service: General;  Laterality: Left;  . CARDIAC CATHETERIZATION    . COLONOSCOPY    . CORONARY ANGIOPLASTY  2  . CORONARY STENT INTERVENTION N/A 02/17/2020   Procedure: CORONARY STENT INTERVENTION;  Surgeon: Anner Alm ORN, MD;  Location: [facility]  INVASIVE CV LAB;  Service: Cardiovascular;  Laterality: N/A;  . ESOPHAGOGASTRODUODENOSCOPY (EGD) WITH PROPOFOL  N/A 11/07/2016   Procedure: ESOPHAGOGASTRODUODENOSCOPY (EGD) WITH PROPOFOL ;  Surgeon: Avram Lupita BRAVO, MD;  Location: [facility] ENDOSCOPY;  Service: Endoscopy;  Laterality: N/A;  . ESOPHAGOGASTRODUODENOSCOPY (EGD) WITH PROPOFOL  N/A 01/05/2023   Procedure: ESOPHAGOGASTRODUODENOSCOPY (EGD) WITH PROPOFOL ;  Surgeon: Abran Norleen SAILOR, MD;  Location: [facility] ENDOSCOPY;  Service: Gastroenterology;  Laterality: N/A;  . EXCISION OF SKIN TAG Right 11/05/2014   Procedure: EXCISION OF RIGHT EYELID SKIN TAG;  Surgeon: Vicenta Poli, MD;  Location: [facility] OR;  Service: General;  Laterality: Right;  . EYE SURGERY Bilateral    cataract   . GASTRIC BYPASS  1977    reversed in [facility], [facility]  . LEFT HEART CATH AND CORONARY ANGIOGRAPHY N/A 02/17/2020   Procedure: LEFT HEART CATH AND CORONARY ANGIOGRAPHY;  Surgeon: Anner Alm ORN, MD;  Location: [facility] INVASIVE CV LAB;  Service: Cardiovascular;  Laterality: N/A;  . LEFT HEART CATHETERIZATION WITH CORONARY ANGIOGRAM N/A 06/29/2014   Procedure: LEFT HEART CATHETERIZATION WITH CORONARY ANGIOGRAM;  Surgeon: Debby DELENA Sor, MD;  Location[facility] CATH LAB;  Service: Cardiovascular;  Laterality: N/A;  . MEMBRANE PEEL Right 10/23/2018   Procedure: MEMBRANE PEEL;  Surgeon: Elner Arley DELENA, MD;  Location[facility] OR;  Service: Ophthalmology;   Laterality: Right;  . MI with stent placement  2004  . PARS PLANA VITRECTOMY Right 10/23/2018   Procedure: PARS PLANA VITRECTOMY WITH 25 GAUGE;  Surgeon: Elner Arley DELENA, MD;  Location: [facility] OR;  Service: Ophthalmology;  Laterality: Right;   Social History:  reports that she has never smoked. She has been exposed to tobacco smoke. She has never used smokeless tobacco. She reports that she does not drink alcohol and does not use drugs. Family History:  Family History  Problem Relation Age of Onset  . Breast cancer Mother 47  . Heart disease Mother   . Throat cancer Father   . Hypertension Father   . Arthritis Father   . Diabetes Father   . Arthritis Sister   . Obesity Sister   . Diabetes Sister   . Heart disease Cousin   . Colon cancer Neg Hx   . Stomach cancer Neg Hx   . Esophageal cancer Neg Hx      HOME MEDICATIONS: Allergies as of 12/12/2023       Reactions   Sulfonamide Derivatives Swelling, Other (See Comments)   Mouth swelling- no resp issues noted   Duloxetine     Headaches   Lokelma  [sodium Zirconium Cyclosilicate ] Other (See Comments)   Headache, stomach upset   Aricept  [donepezil  Hcl] Diarrhea, Nausea And Vomiting, Other (See Comments)   GI upset/loose stools   Tramadol  Nausea And Vomiting        Medication List        Accurate as of December 12, 2023  8:47 AM. If you have any questions, ask your nurse or doctor.          albuterol  108 (90 Base) MCG/ACT inhaler Commonly known as: ProAir  HFA Inhale 1-2 puffs into the lungs every 6 (six) hours as needed for wheezing or shortness of breath.   amitriptyline  75 MG tablet Commonly known as: ELAVIL  Take 1 tablet (75 mg total) by mouth at bedtime.   amoxicillin  500 MG capsule Commonly known as: AMOXIL  Take 1 capsule (500 mg total) by mouth 2 (two) times daily for 10 days.   atorvastatin  80 MG tablet Commonly known as: LIPITOR  TAKE 1 TABLET BY  MOUTH EVERY DAY   azelastine  0.1 % nasal spray Commonly known  as: ASTELIN  Place 2 sprays into both nostrils 2 (two) times daily.   calcitRIOL  0.25 MCG capsule Commonly known as: ROCALTROL  Take 0.25 mcg by mouth daily.   carvedilol  12.5 MG tablet Commonly known as: COREG  TAKE 1 TABLET BY MOUTH 2 TIMES DAILY   cetirizine  10 MG tablet Commonly known as: ZYRTEC  Take 10 mg by mouth daily as needed for allergies or rhinitis.   diclofenac  Sodium 1 % Gel Commonly known as: Voltaren  Apply 2 g topically 4 (four) times daily.   Eliquis  5 MG Tabs tablet Generic drug: apixaban  TAKE 1 TABLET BY MOUTH 2 TIMES DAILY   febuxostat  40 MG tablet Commonly known as: ULORIC  TAKE 1 TABLET BY MOUTH EVERY DAY   fluticasone  50 MCG/ACT nasal spray Commonly known as: FLONASE  Place 2 sprays into both nostrils daily.   folic acid  1 MG tablet Commonly known as: FOLVITE  TAKE 1 TABLET BY MOUTH EVERY DAY   FreeStyle Libre 14 Day Sensor Misc PLACE 1 DEVICE ON THE SKIN AS DIRECTED EVERY 14 DAYS   FreeStyle Libre 3 Reader Devi Continuous glucose monitor   gabapentin  100 MG capsule Commonly known as: NEURONTIN  Take 1 capsule (100 mg total) by mouth at bedtime.   glucagon  1 MG injection Inject 1 mg into the vein once as needed for up to 1 dose.   glucose 4 GM chewable tablet Chew 1 tablet (4 g total) by mouth as needed for low blood sugar.   hydrOXYzine  25 MG tablet Commonly known as: ATARAX  TAKE 1 TABLET BY MOUTH EVERY 8 HOURS AS NEEDED FOR ANXIETY OR ITCHING (NASAL AND THROAT CONGESTION)   ipratropium-albuterol  0.5-2.5 (3) MG/3ML Soln Commonly known as: DUONEB Take 3 mLs by nebulization every 6 (six) hours as needed (cough, wheezing, or shortness of breath).   meclizine  25 MG tablet Commonly known as: ANTIVERT  Take 1 tablet (25 mg total) by mouth 2 (two) times daily.   melatonin 5 MG Tabs Take 1 tablet (5 mg total) by mouth at bedtime as needed.   memantine  5 MG tablet Commonly known as: NAMENDA  TAKE 2 TABLETS BY MOUTH EVERY MORNING and TAKE 1  TABLET EVERY EVENING   montelukast  10 MG tablet Commonly known as: SINGULAIR  Take 1 tablet (10 mg total) by mouth at bedtime.   nitroGLYCERIN  0.4 MG SL tablet Commonly known as: Nitrostat  Dissolve 1 tablet under the tongue every 5 minutes as needed for chest pain. Max of 3 doses, then 911.   NovoLOG  FlexPen 100 UNIT/ML FlexPen Generic drug: insulin  aspart INJECT 14 UNITS SUBCUTANEOUSLY TWICE DAILY WITH A MEAL . DO NOT EXCEED 70 PER 24 HOURS   nystatin  powder Commonly known as: MYCOSTATIN /NYSTOP  Apply 1 Application topically 3 (three) times daily.   ondansetron  4 MG disintegrating tablet Commonly known as: ZOFRAN -ODT Take 1 tablet (4 mg total) by mouth every 8 (eight) hours as needed for nausea or vomiting.   pantoprazole  20 MG tablet Commonly known as: PROTONIX  TAKE 1 TABLET BY MOUTH ONCE DAILY   predniSONE  10 MG tablet Commonly known as: DELTASONE  Take 4 tablets (40 mg total) by mouth daily with breakfast.   ranolazine  500 MG 12 hr tablet Commonly known as: RANEXA  Take 1 tablet (500 mg total) by mouth 2 (two) times daily.   Rybelsus  7 MG Tabs Generic drug: Semaglutide  Take 1 tablet (7 mg total) by mouth daily.   torsemide  20 MG tablet Commonly known as: DEMADEX  TAKE 1 TABLET BY MOUTH  EVERY DAY MAY TAKE AN EXTRA DOES FOR WEIGHT GAIN OF 3LBs IN 1 DAY OR 5LBS IN 1 WEEK OR FOR WORSENING SWELLING   Toujeo  Max SoloStar 300 UNIT/ML Solostar Pen Generic drug: insulin  glargine (2 Unit Dial ) Inject 28 units subcutaneously one daily What changed:  how much to take how to take this when to take this   Trelegy Ellipta  100-62.5-25 MCG/ACT Aepb Generic drug: Fluticasone -Umeclidin-Vilant Inhale 1 puff into the lungs daily.   TYLENOL  500 MG tablet Generic drug: acetaminophen  Take 1,000 mg by mouth every 6 (six) hours as needed for mild pain or headache.   Tylenol  PM Extra Strength 500-25 MG Tabs tablet Generic drug: diphenhydramine -acetaminophen  Take 2 tablets by mouth at  bedtime.   Vitamin D  (Ergocalciferol ) 1.25 MG (50000 UNIT) Caps capsule Commonly known as: DRISDOL  TAKE 1 CAPSULE BY MOUTH ONCE WEEKLY ON MONDAY        PHYSICAL EXAM: VS: BP 116/78 (BP Location: Left Arm, Patient Position: Sitting, Cuff Size: Normal)   Pulse 64   Ht 5' 7 (1.702 m)   Wt 268 lb (121.6 kg)   SpO2 95%   BMI 41.97 kg/m    EXAM: General: Pt appears well and is in NAD in a wheelchair   Lungs: Clear with good BS bilat  Heart: Auscultation: RRR   Extremities:  BL LE: No pretibial edema , with ankle edema    DM Foot Exam 06/13/2023 per podiatry     DATA REVIEWED:  Lab Results  Component Value Date   HGBA1C 7.4 (A) 12/12/2023   HGBA1C 9.4 (H) 09/25/2023   HGBA1C 7.7 (A) 06/21/2023     Latest Reference Range & Units 11/28/23 13:34  Sodium 135 - 145 mmol/L 133 (L)  Potassium 3.5 - 5.1 mmol/L 4.3  Chloride 98 - 111 mmol/L 100  CO2 22 - 32 mmol/L 21 (L)  Glucose 70 - 99 mg/dL 878 (H)  BUN 8 - 23 mg/dL 73 (H)  Creatinine 9.55 - 1.00 mg/dL 6.90 (H)  Calcium  8.9 - 10.3 mg/dL 7.5 (L)  Anion gap 5 - 15  12  Alkaline Phosphatase 38 - 126 U/L 68  Albumin  3.5 - 5.0 g/dL 2.3 (L)  Lipase 11 - 51 U/L 37  AST 15 - 41 U/L 17  ALT 0 - 44 U/L 9  Total Protein 6.5 - 8.1 g/dL 6.1 (L)  Total Bilirubin 0.0 - 1.2 mg/dL 0.4  GFR, Estimated >39 mL/min 14 (L)   Old records , labs and images have been reviewed.    ASSESSMENT / PLAN / RECOMMENDATIONS:   1)Type 2 Diabetes Mellitus, Sub-Optimally  Controlled, With Neuropathic, CKD IV and macrovascular complications - Most recent A1c of 7.8 %. Goal A1c < 7.5 %.      - A1c is skewed due to CKD - Main barriers to diabetes care is memory loss.  -Ozempic  switched to Rybelsus  through PCPs office due to GI side effects to Ozempic  -Unable to increase Rybelsus  at this time, as the patient does have some GI symptoms -Patient with recurrent hypoglycemia, will decrease Toujeo , patient may take Toujeo  or in 1 dose rather than dividing  the dose into twice daily -I will not change her correction scale that was provided at discharge to the hospital (per daughter) -A prescription for freestyle libre 3+ was sent to the pharmacy as well as a new receiver, although receiver stopped working, insurance is not paying for the new receiver, will see if they will cover it with changing the sensor to freestyle  libre 3+    MEDICATIONS: Decrease  Toujeo  40  units daily  Continue Rybelsus  7 mg daily NovoLog   140-199 (2units) 200-250 (6 units) 251-299 (8 units) 300-349 (12 units) 350 and above (14 units).   EDUCATION / INSTRUCTIONS: BG monitoring instructions: Patient is instructed to check her blood sugars 4 times a day, before meals and bedtime. Call [facility] if: BG persistently < 70  I reviewed the Rule of 15 for the treatment of hypoglycemia in detail with the patient. Literature supplied.    F/U in 6 months   I spent 25 minutes preparing to see the patient by review of recent labs, imaging and procedures, obtaining and reviewing separately obtained history, communicating with the patient/family or caregiver, ordering medications, tests or procedures, and documenting clinical information in the EHR including the differential Dx, treatment, and any further evaluation and other management   Signed electronically by: Stefano Redgie Butts, MD  [facility] Endocrinology  [facility] [address] [place] [place], [place] [address] Phone: [phone] FAX: [phone]   CC: Sebastian Beverley NOVAK, MD [address] [place] [address] Phone: [phone]  Fax: [phone]  Return to Endocrinology clinic as below: Future Appointments  Date Time Provider Department Center  12/19/2023 11:00 AM Deanna Channing LABOR, RPH [facility] None  12/24/2023  1:30 PM [facility] LAB [facility] None  12/24/2023  2:15 PM [facility] FLUSH [facility] None  01/15/2024 10:30 AM Onita Duos, MD [facility]  None  01/16/2024 10:00 AM Prentiss Heddy HERO, RN [facility] None  01/21/2024 11:00 AM Loreda Hacker, DPM [facility]  01/28/2024  1:00 PM [facility] LAB [facility] None  01/28/2024  1:30 PM Onesimo Emaline Brink, MD [facility] None  01/28/2024  2:15 PM [facility] FLUSH [facility] None  02/25/2024  1:30 PM [facility] LAB [facility] None  02/25/2024  2:15 PM [facility] FLUSH [facility] None  03/05/2024  1:30 PM Goodrich, Callie E, PA-C [facility]

## 2023-12-13 ENCOUNTER — Other Ambulatory Visit: Payer: Self-pay

## 2023-12-13 MED ORDER — TOUJEO MAX SOLOSTAR 300 UNIT/ML ~~LOC~~ SOPN: PEN_INJECTOR | SUBCUTANEOUS | 3 refills | Status: DC

## 2023-12-18 NOTE — Progress Notes (Unsigned)
   12/18/2023  Patient ID: [name], female   DOB: 1935/04/03, 89 y.o.   MRN: [id number]  Outreach attempt to follow-up with patient's daughter (POA/DPR) on management of diabetes was not successful, but I was able to leave a HIPAA compliant voicemail with my direct phone number.  If I do not hear back, I will try to call again next week.  [name], PharmD, DPLA

## 2023-12-19 ENCOUNTER — Other Ambulatory Visit: Payer: Self-pay

## 2023-12-21 ENCOUNTER — Other Ambulatory Visit: Payer: Self-pay

## 2023-12-21 DIAGNOSIS — D509 Iron deficiency anemia, unspecified: Secondary | ICD-10-CM

## 2023-12-24 ENCOUNTER — Inpatient Hospital Stay: Attending: Internal Medicine

## 2023-12-24 ENCOUNTER — Inpatient Hospital Stay

## 2023-12-24 ENCOUNTER — Other Ambulatory Visit: Payer: Self-pay | Admitting: Family Medicine

## 2023-12-26 ENCOUNTER — Telehealth: Payer: Self-pay

## 2023-12-26 DIAGNOSIS — C50919 Malignant neoplasm of unspecified site of unspecified female breast: Secondary | ICD-10-CM | POA: Diagnosis not present

## 2023-12-26 DIAGNOSIS — J449 Chronic obstructive pulmonary disease, unspecified: Secondary | ICD-10-CM | POA: Diagnosis not present

## 2023-12-26 DIAGNOSIS — I2510 Atherosclerotic heart disease of native coronary artery without angina pectoris: Secondary | ICD-10-CM | POA: Diagnosis not present

## 2023-12-26 DIAGNOSIS — K2971 Gastritis, unspecified, with bleeding: Secondary | ICD-10-CM | POA: Diagnosis not present

## 2023-12-26 DIAGNOSIS — N184 Chronic kidney disease, stage 4 (severe): Secondary | ICD-10-CM | POA: Diagnosis not present

## 2023-12-26 DIAGNOSIS — R55 Syncope and collapse: Secondary | ICD-10-CM | POA: Diagnosis not present

## 2023-12-26 DIAGNOSIS — E66813 Obesity, class 3: Secondary | ICD-10-CM | POA: Diagnosis not present

## 2023-12-26 DIAGNOSIS — K921 Melena: Secondary | ICD-10-CM | POA: Diagnosis not present

## 2023-12-26 DIAGNOSIS — E1142 Type 2 diabetes mellitus with diabetic polyneuropathy: Secondary | ICD-10-CM | POA: Diagnosis not present

## 2023-12-26 DIAGNOSIS — E1122 Type 2 diabetes mellitus with diabetic chronic kidney disease: Secondary | ICD-10-CM | POA: Diagnosis not present

## 2023-12-26 DIAGNOSIS — I129 Hypertensive chronic kidney disease with stage 1 through stage 4 chronic kidney disease, or unspecified chronic kidney disease: Secondary | ICD-10-CM | POA: Diagnosis not present

## 2023-12-26 DIAGNOSIS — I25110 Atherosclerotic heart disease of native coronary artery with unstable angina pectoris: Secondary | ICD-10-CM | POA: Diagnosis not present

## 2023-12-26 DIAGNOSIS — D62 Acute posthemorrhagic anemia: Secondary | ICD-10-CM | POA: Diagnosis not present

## 2023-12-26 DIAGNOSIS — I5032 Chronic diastolic (congestive) heart failure: Secondary | ICD-10-CM | POA: Diagnosis not present

## 2023-12-26 DIAGNOSIS — Z794 Long term (current) use of insulin: Secondary | ICD-10-CM | POA: Diagnosis not present

## 2023-12-26 DIAGNOSIS — F0394 Unspecified dementia, unspecified severity, with anxiety: Secondary | ICD-10-CM | POA: Diagnosis not present

## 2023-12-26 DIAGNOSIS — I130 Hypertensive heart and chronic kidney disease with heart failure and stage 1 through stage 4 chronic kidney disease, or unspecified chronic kidney disease: Secondary | ICD-10-CM | POA: Diagnosis not present

## 2023-12-26 DIAGNOSIS — D631 Anemia in chronic kidney disease: Secondary | ICD-10-CM | POA: Diagnosis not present

## 2023-12-26 DIAGNOSIS — K922 Gastrointestinal hemorrhage, unspecified: Secondary | ICD-10-CM | POA: Diagnosis not present

## 2023-12-26 DIAGNOSIS — I10 Essential (primary) hypertension: Secondary | ICD-10-CM | POA: Diagnosis not present

## 2023-12-26 DIAGNOSIS — K2950 Unspecified chronic gastritis without bleeding: Secondary | ICD-10-CM | POA: Diagnosis not present

## 2023-12-26 DIAGNOSIS — Z66 Do not resuscitate: Secondary | ICD-10-CM | POA: Diagnosis not present

## 2023-12-26 DIAGNOSIS — N179 Acute kidney failure, unspecified: Secondary | ICD-10-CM | POA: Diagnosis not present

## 2023-12-26 DIAGNOSIS — F0393 Unspecified dementia, unspecified severity, with mood disturbance: Secondary | ICD-10-CM | POA: Diagnosis not present

## 2023-12-26 DIAGNOSIS — K449 Diaphragmatic hernia without obstruction or gangrene: Secondary | ICD-10-CM | POA: Diagnosis not present

## 2023-12-26 DIAGNOSIS — F332 Major depressive disorder, recurrent severe without psychotic features: Secondary | ICD-10-CM | POA: Diagnosis not present

## 2023-12-26 DIAGNOSIS — J4489 Other specified chronic obstructive pulmonary disease: Secondary | ICD-10-CM | POA: Diagnosis not present

## 2023-12-26 DIAGNOSIS — E785 Hyperlipidemia, unspecified: Secondary | ICD-10-CM | POA: Diagnosis not present

## 2023-12-26 DIAGNOSIS — Z6841 Body Mass Index (BMI) 40.0 and over, adult: Secondary | ICD-10-CM | POA: Diagnosis not present

## 2023-12-26 NOTE — Progress Notes (Signed)
   12/26/2023  Patient ID: [name], female   DOB: 03-31-1935, 89 y.o.   MRN: [id number]  Chart notes from recent endo visit with Dr. Sam reflect patient still does not have working [place] 3 reader, and test claim reflects a PA is required by [employer]	[place].  Prior authorization submitted to [employer] via CoverMyMeds (KEY [id number]).  I will monitor progress of PA and inform patient/daughter once decision known and check in on management of diabetes.  [name], PharmD, DPLA

## 2023-12-27 ENCOUNTER — Other Ambulatory Visit: Payer: Self-pay | Admitting: Family Medicine

## 2023-12-27 DIAGNOSIS — D62 Acute posthemorrhagic anemia: Secondary | ICD-10-CM | POA: Diagnosis not present

## 2023-12-27 DIAGNOSIS — I129 Hypertensive chronic kidney disease with stage 1 through stage 4 chronic kidney disease, or unspecified chronic kidney disease: Secondary | ICD-10-CM | POA: Diagnosis not present

## 2023-12-27 DIAGNOSIS — I2510 Atherosclerotic heart disease of native coronary artery without angina pectoris: Secondary | ICD-10-CM | POA: Diagnosis not present

## 2023-12-27 DIAGNOSIS — K921 Melena: Secondary | ICD-10-CM | POA: Diagnosis not present

## 2023-12-27 DIAGNOSIS — N184 Chronic kidney disease, stage 4 (severe): Secondary | ICD-10-CM | POA: Diagnosis not present

## 2023-12-28 NOTE — Progress Notes (Signed)
   12/28/2023  Patient ID: [name], female   DOB: Nov 06, 1934, 89 y.o.   MRN: [id number]  PA for [facility] 3 Reader denied by Medicare Part D, stating this is covered under patient's Part B.  Submitted order to DME supplier via [employer] to see if they can fill by billing part B.  [name], PharmD, DPLA

## 2024-01-02 ENCOUNTER — Telehealth: Payer: Self-pay

## 2024-01-02 NOTE — Progress Notes (Signed)
   01/02/2024  Patient ID: [name], female   DOB: Mar 27, 1935, 89 y.o.   MRN: [id number]  Patient recently discharged from hospital; calling daughter, [name]) to check in.  Patient is doing well and they are currently on vacation through Labor Day weekend.  Libre reader continues not to work properly, and [employer] on phone is not currently working with sensor placed approximately one week ago.  Insurance will not cover a new Libre reader based on the last time this was filled.  I am going to see if they would either cover Dexcom G7 since patient/daughter has had several issues with Herlene or if they will place an override to cover a new Libre 3 reader.  If neither of these are an option, I will see if they can come see me at [facility] Tuesday 9/2 for me to assist with [facility] app.  [name], PharmD, DPLA

## 2024-01-03 ENCOUNTER — Telehealth: Payer: Self-pay

## 2024-01-03 NOTE — Progress Notes (Signed)
   01/03/2024  Patient ID: Jackquelyn CHRISTELLA Gaskins, female   DOB: 06/08/1934, 89 y.o.   MRN: [id number]  Outreach attempt to inform patient's daughter that insurance will not cover any new CGM reader; however, I have been able to secure a new Libre 3 reader through the [employer] rep for the practice.  I did  leave a HIPAA compliant voicemail with my direct phone number, and will try to call again tomorrow if I don't not hear back- hoping patient can come to see me at [facility] Tuesday to pick up.  [name], PharmD, DPLA

## 2024-01-04 ENCOUNTER — Telehealth: Payer: Self-pay

## 2024-01-04 NOTE — Progress Notes (Signed)
   01/04/2024  Patient ID: [name], female   DOB: 03-19-1935, 89 y.o.   MRN: [id number]  Outreach attempt to inform patient's daughter that insurance will not cover any new CGM reader; however, I have been able to secure a new Libre 3 reader through the [employer] rep for the practice.  I was not able to reach [place], and the voicemail was full.  I will try to call her back Tuesday morning to see if she can come see me at [facility] to pick a new reader up.  [name], PharmD, DPLA

## 2024-01-08 ENCOUNTER — Telehealth: Payer: Self-pay

## 2024-01-08 NOTE — Progress Notes (Signed)
   01/08/2024  Patient ID: [name], female   DOB: 1934/11/25, 89 y.o.   MRN: [id number]  Outreach attempt to notify patient's daughter, Sharlet [facility]), that I have a new Libre 3 reader for the patient.  I was not able to reach [place], but I did leave a voicemail with my direct phone number.  [name], PharmD, DPLA

## 2024-01-09 ENCOUNTER — Telehealth: Payer: Self-pay

## 2024-01-09 NOTE — Progress Notes (Signed)
   9/[name]/2025  Patient ID: [name], female   DOB: 06-02-1934, 89 y.o.   MRN: [id number]  Patient's daughter, [name]), calling me back in regard to [name] monitor.  Patient has 2 scheduled visits at [facility] next week for hospital follow-up.  Patient will call me back to confirm if they will be seeing Dr. Thedora Tuesday or Dr. Sebastian Wednesday, so I can arrange getting them the new reader.  [name], PharmD, DPLA

## 2024-01-10 ENCOUNTER — Other Ambulatory Visit: Payer: Self-pay | Admitting: Hematology

## 2024-01-14 ENCOUNTER — Ambulatory Visit: Payer: PPO | Admitting: Internal Medicine

## 2024-01-15 ENCOUNTER — Telehealth: Payer: Self-pay | Admitting: Neurology

## 2024-01-15 ENCOUNTER — Ambulatory Visit: Admitting: Neurology

## 2024-01-15 ENCOUNTER — Encounter: Payer: Self-pay | Admitting: Neurology

## 2024-01-15 ENCOUNTER — Inpatient Hospital Stay: Admitting: Family Medicine

## 2024-01-15 VITALS — BP 131/86 | Ht 67.0 in

## 2024-01-15 DIAGNOSIS — E1140 Type 2 diabetes mellitus with diabetic neuropathy, unspecified: Secondary | ICD-10-CM | POA: Diagnosis not present

## 2024-01-15 DIAGNOSIS — Z794 Long term (current) use of insulin: Secondary | ICD-10-CM

## 2024-01-15 DIAGNOSIS — R269 Unspecified abnormalities of gait and mobility: Secondary | ICD-10-CM | POA: Insufficient documentation

## 2024-01-15 DIAGNOSIS — G43709 Chronic migraine without aura, not intractable, without status migrainosus: Secondary | ICD-10-CM | POA: Insufficient documentation

## 2024-01-15 DIAGNOSIS — R531 Weakness: Secondary | ICD-10-CM | POA: Diagnosis not present

## 2024-01-15 DIAGNOSIS — R52 Pain, unspecified: Secondary | ICD-10-CM

## 2024-01-15 DIAGNOSIS — R519 Headache, unspecified: Secondary | ICD-10-CM | POA: Insufficient documentation

## 2024-01-15 MED ORDER — UBRELVY 50 MG PO TABS: ORAL_TABLET | ORAL | 6 refills | Status: DC

## 2024-01-15 MED ORDER — GABAPENTIN 100 MG PO CAPS: 100.0000 mg | ORAL_CAPSULE | Freq: Every day | ORAL | 3 refills | Status: DC

## 2024-01-15 NOTE — Progress Notes (Signed)
 Chief Complaint  Patient presents with   Headache    Rm 15 with daughter   Pt is well,reports she has a headache about once a week currently. She also mentions her weakness has worsen and she feels she is going to fall and off balance.       ASSESSMENT AND PLAN  [name] is a 89 y.o. female   Complicated past medical history, presenting with few months history of intermittent headaches,  Chronic elevated ESR, C-reactive protein, in the setting of anemia, polymyalgia rheumatica, chronic kidney disease, severe deconditioning, not necessarily indicating the temporal arteritis, she has been treated with prednisone  60 mg for over 2 weeks, significant improvement of body achy pain and headaches, I doubt temporal arteritis biopsy now would be high yield,  Prednisone  was stopped since August, no worsening of her headaches, most recent repeat ESR C-reactive protein on Sep 26, 2023 was within normal limit normal CPK,  Her intermittent headache about once a week now, responding to [facility] powder as needed, will also try Ubrelvy  as needed, headache Migraine Features  Refill her gabapentin   Continue follow-up with primary care only return to clinic for new issues  DIAGNOSTIC DATA (LABS, IMAGING, TESTING) - I reviewed patient records, labs, notes, testing and imaging myself where available.   MEDICAL HISTORY:  [name], is a 89 year old female, accompanied by her daughter, caregiver, seen in request by neurohospitalist Michaela Pay to follow-up on her most recent ED visit for new onset headache, her primary care is Dr. Sebastian, [place]   History is obtained from the patient and review of electronic medical records. I personally reviewed pertinent available imaging films in PACS.   PMHx of  DM DM PN. Right leg DVT-on Eliquis  Hx of left breast cancer,  CAD HTN HLD GERD Obesity Depression anxiety,  Polymyalgia rheumatica  She presented to emergency room on Sep 13, 2023 for  worsening headache over the past 3 weeks, on top of her head, intermittent, seems to getting worse after a fall several months ago, but she denies significant jaw claudication, no swallowing difficulty, intermittent blurry vision  She has significant upper and lower extremity pain, daughter has lived with her for 15 years, she has been nonambulatory over past 15 years, able to help with transfer, but spent most of the time lying down or sitting down, significant body achy pain, carries a diagnosis of polymyalgia rheumatica  During her emergency room stay, she was found to have significant elevated ESR of 87, C-reactive protein 1.4, highly concerning for temporal arteritis, she was started on prednisone  20 mg 3 tablets every morning, soon after that, her symptoms has much improved, she denies significant headaches now, but complains of difficulty swallowing pills, her muscle achy and joints pain has much improved with steroid treatment  CT head on Sep 13, 2023 showed chronic atrophic and ischemic change, no acute abnormality  CT cervical spine on June 04, 2023 showed multilevel degenerative changes, no acute abnormality,  Reviewed extensive laboratory evaluation, elevated ESR 87 actually relatively improved compared to previous number of 103 in January 2025, 110 in May 2016, her ESR has been elevated since 2010,  C-reactive protein 1.4 is also improved compared to previous number of 4 in September 2020, 10.4  In addition she has a long history of anemia, hemoglobin stay between 8.2-9.4, which can also cause elevated ESR  She has abnormal kidney function creatinine of 2.3, GFR of 20, BUN of 66 which can contributed to the elevation  of inflammatory marker as well, her ferritin level was 258, vitamin D  79  UPDATE Sept 9th 2025: She is accompanied by 2 daughters at visit, has been off prednisone  since August 2025,  no significant worsening of her headache, she continues to have headache once a week,  left retro-orbital area pain, lasting for few hours, take BC powder as needed about once a week, which is helpful, she also complains of left ear congestion, nasal discharge,  Slow decline in functional spent as, spent most of time in bed, recliner, pivot herself to bedside commode, not walking much, has difficulty sleeping, has good appetite  PHYSICAL EXAM:   Vitals:   01/15/24 1029  BP: 131/86  Height: 5' 7 (1.702 m)     Body mass index is 41.97 kg/m.  PHYSICAL EXAMNIATION:  Gen: NAD, conversant, well nourised, well groomed                     Cardiovascular: Regular rate rhythm, no peripheral edema, warm, nontender. Eyes: Conjunctivae clear without exudates or hemorrhage Neck: Supple, no carotid bruits. Pulmonary: Clear to auscultation bilaterally   NEUROLOGICAL EXAM:  MENTAL STATUS: Speech/cognition: Obese, in wheelchair, reliant on her daughter to provide history, CRANIAL NERVES: CN II: Visual fields are full to confrontation. Pupils are round equal and briskly reactive to light. CN III, IV, VI: extraocular movement are normal. No ptosis. CN V: Facial sensation is intact to light touch CN VII: Face is symmetric with normal eye closure  CN VIII: Hearing is normal to causal conversation. CN IX, X: Phonation is normal. CN XI: Head turning and shoulder shrug are intact  MOTOR: Limited due to bilateral shoulder pain, can move 4 extremity without difficulty  REFLEXES: Reflexes are hypoactive and symmetric at the biceps, triceps, absent at knees, and ankles. Plantar responses are flexor.  SENSORY: Length dependent sensory loss to vibratory, pinprick to mid shin level  COORDINATION: There is no trunk or limb dysmetria noted.  GAIT/STANCE: In wheelchair deferred  REVIEW OF SYSTEMS:  Full 14 system review of systems performed and notable only for as above All other review of systems were negative.   ALLERGIES: Allergies  Allergen Reactions   Sulfonamide  Derivatives Swelling and Other (See Comments)    Mouth swelling- no resp issues noted   Duloxetine      Headaches   Lokelma  [Sodium Zirconium Cyclosilicate ] Other (See Comments)    Headache, stomach upset   Aricept  [Donepezil  Hcl] Diarrhea, Nausea And Vomiting and Other (See Comments)    GI upset/loose stools   Tramadol  Nausea And Vomiting    HOME MEDICATIONS: Current Outpatient Medications  Medication Sig Dispense Refill   albuterol  (PROAIR  HFA) 108 (90 Base) MCG/ACT inhaler Inhale 1-2 puffs into the lungs every 6 (six) hours as needed for wheezing or shortness of breath. 6.7 g 1   amitriptyline  (ELAVIL ) 75 MG tablet Take 1 tablet (75 mg total) by mouth at bedtime. 90 tablet 0   atorvastatin  (LIPITOR ) 80 MG tablet TAKE 1 TABLET BY MOUTH EVERY DAY 90 tablet 3   azelastine  (ASTELIN ) 0.1 % nasal spray Place 2 sprays into both nostrils 2 (two) times daily. 30 mL 12   calcitRIOL  (ROCALTROL ) 0.25 MCG capsule Take 0.25 mcg by mouth daily.     carvedilol  (COREG ) 12.5 MG tablet TAKE 1 TABLET BY MOUTH 2 TIMES DAILY 180 tablet 1   cetirizine  (ZYRTEC ) 10 MG tablet Take 10 mg by mouth daily as needed for allergies or rhinitis.  Continuous Glucose Receiver (FREESTYLE LIBRE 3 READER) DEVI Continuous glucose monitor 1 each 0   Continuous Glucose Sensor (FREESTYLE LIBRE 3 PLUS SENSOR) MISC 1 Device by Other route every 14 (fourteen) days. Change sensor every 15 days. 6 each 3   diclofenac  Sodium (VOLTAREN ) 1 % GEL Apply 2 g topically 4 (four) times daily. 150 g 2   ELIQUIS  5 MG TABS tablet TAKE 1 TABLET BY MOUTH 2 TIMES DAILY 180 tablet 3   febuxostat  (ULORIC ) 40 MG tablet TAKE 1 TABLET BY MOUTH EVERY DAY 90 tablet 3   fluticasone  (FLONASE ) 50 MCG/ACT nasal spray Place 2 sprays into both nostrils daily. 16 g 6   folic acid  (FOLVITE ) 1 MG tablet TAKE 1 TABLET BY MOUTH EVERY DAY 90 tablet 1   gabapentin  (NEURONTIN ) 100 MG capsule Take 1 capsule (100 mg total) by mouth at bedtime. 30 capsule 3   glucagon   1 MG injection Inject 1 mg into the vein once as needed for up to 1 dose. 1 each 12   glucose 4 GM chewable tablet Chew 1 tablet (4 g total) by mouth as needed for low blood sugar. 50 tablet 12   hydrOXYzine  (ATARAX ) 25 MG tablet TAKE 1 TABLET BY MOUTH EVERY 8 HOURS AS NEEDED FOR ANXIETY OR ITCHING (NASAL AND THROAT CONGESTION) 120 tablet 0   insulin  glargine, 2 Unit Dial , (TOUJEO  MAX SOLOSTAR) 300 UNIT/ML Solostar Pen Inject 40 units subcutaneously one daily 15 mL 3   ipratropium-albuterol  (DUONEB) 0.5-2.5 (3) MG/3ML SOLN Take 3 mLs by nebulization every 6 (six) hours as needed (cough, wheezing, or shortness of breath). 360 mL 0   meclizine  (ANTIVERT ) 25 MG tablet Take 1 tablet (25 mg total) by mouth 2 (two) times daily. 180 tablet 1   melatonin 5 MG TABS Take 1 tablet (5 mg total) by mouth at bedtime as needed. 15 tablet 0   memantine  (NAMENDA ) 5 MG tablet TAKE 2 TABLETS BY MOUTH EVERY MORNING and TAKE 1 TABLET EVERY EVENING 270 tablet 5   montelukast  (SINGULAIR ) 10 MG tablet Take 1 tablet (10 mg total) by mouth at bedtime. 30 tablet 3   nitroGLYCERIN  (NITROSTAT ) 0.4 MG SL tablet Dissolve 1 tablet under the tongue every 5 minutes as needed for chest pain. Max of 3 doses, then 911. 25 tablet 6   NOVOLOG  FLEXPEN 100 UNIT/ML FlexPen INJECT 14 UNITS SUBCUTANEOUSLY TWICE DAILY WITH A MEAL . DO NOT EXCEED 70 PER 24 HOURS (Patient taking differently: INJECT 14 UNITS SUBCUTANEOUSLY TWICE DAILY WITH A MEAL . DO NOT EXCEED 70 PER 24 HOURS) 15 mL 0   nystatin  (MYCOSTATIN /NYSTOP ) powder Apply 1 Application topically 3 (three) times daily. 60 g 3   ondansetron  (ZOFRAN -ODT) 4 MG disintegrating tablet Take 1 tablet (4 mg total) by mouth every 8 (eight) hours as needed for nausea or vomiting. 20 tablet 0   pantoprazole  (PROTONIX ) 20 MG tablet TAKE 1 TABLET BY MOUTH ONCE DAILY 30 tablet 5   predniSONE  (DELTASONE ) 10 MG tablet Take 4 tablets (40 mg total) by mouth daily with breakfast. 120 tablet 6   ranolazine   (RANEXA ) 500 MG 12 hr tablet Take 1 tablet (500 mg total) by mouth 2 (two) times daily. 60 tablet 1   Semaglutide  (RYBELSUS ) 7 MG TABS Take 1 tablet (7 mg total) by mouth daily. 30 tablet 1   torsemide  (DEMADEX ) 20 MG tablet TAKE 1 TABLET BY MOUTH EVERY DAY MAY TAKE AN EXTRA DOES FOR WEIGHT GAIN OF 3LBs IN 1 DAY OR 5LBS IN  1 WEEK OR FOR WORSENING SWELLING 30 tablet 1   TYLENOL  500 MG tablet Take 1,000 mg by mouth every 6 (six) hours as needed for mild pain or headache.     TYLENOL  PM EXTRA STRENGTH 500-25 MG TABS tablet Take 2 tablets by mouth at bedtime.     Vitamin D , Ergocalciferol , (DRISDOL ) 1.25 MG (50000 UNIT) CAPS capsule TAKE 1 CAPSULE BY MOUTH ONCE WEEKLY ON MONDAY 4 capsule 1   No current facility-administered medications for this visit.    PAST MEDICAL HISTORY: Past Medical History:  Diagnosis Date   Abdominal pain 01/26/2012   Acute deep vein thrombosis (DVT) of femoral vein of right lower extremity (HCC) 03/26/2022   Acute kidney injury superimposed on chronic kidney disease (HCC) 12/18/2014   Acute upper GI bleed 03/26/2022   Allergy     takes Mucinex  daily as needed   Anemia, unspecified    Anxiety    takes Clonazepam  daily as needed   Anxiety associated with depression 06/08/2017   Arthritis    Breast cancer (HCC)    Breast cancer screening 02/19/2014   Breast mass 10/27/2014   Chronic diastolic CHF (congestive heart failure) (HCC)    takes Furosemide  daily   Chronic pain syndrome 02/06/2015   CKD (chronic kidney disease), stage III (HCC)    Coronary artery disease    a. s/p IMI 2004 tx with BMS to RCA;  b. s/p Promus DES to RCA 2/12 c. abnormal nuc 2016 -> cath with med rx. d. NSTEMI 02/2020  mv CAD with severe ISR in pRCA and m/dRCA lesion both treated with DES.   Debility 08/01/2012   Dementia (HCC)    Depression    takes Cymbalta  daily   Diabetes mellitus    insulin  daily   DOE (dyspnea on exertion) 06/24/2014   Dysphagia, unspecified(787.20) 07/31/2012    Fungal dermatitis 11/13/2007   Qualifier: Diagnosis of  By: Jame  MD, Maude FALCON    GERD (gastroesophageal reflux disease)    takes Protonix  daily   Gout, unspecified    Headache    occasionally   History of anemia due to chronic kidney disease 03/26/2022   History of blood clots    History of deep venous thrombosis 01/27/2012   History of pulmonary embolus (PE) 04/22/2014   Hyperkalemia 07/26/2017   Hyperlipidemia    takes Pravastatin  daily   Hypertension    Insomnia    Joint pain    Joint swelling    Morbid obesity (HCC)    Multiple benign nevi 11/15/2016   Myocardial infarction [facility]) 2004   Non-STEMI (non-ST elevated myocardial infarction) (HCC) 02/15/2020   Nonintractable headache 01/07/2008   Qualifier: Diagnosis of   By: Jame  MD, Maude FALCON        Numbness    Obstructive sleep apnea    does not wear cpap   Osteoarthritis    Osteoarthritis    Osteoarthrosis, unspecified whether generalized or localized, lower leg    Pain in the chest 12/31/2013   Pain, chronic    Personal history of other diseases of the digestive system 12/03/2006   Qualifier: Diagnosis of  By: Jame  MD, Maude FALCON    Polymyalgia rheumatica [facility])    Presence of stent in right coronary artery    Pulmonary emboli (HCC) 01/2012   felt to need lifelong anticoagulation but Xarelto  dc in 02/2020 due to need for DAPT   Situational depression 06/04/2009   Qualifier: Diagnosis of  By: Jame  MD, Maude FALCON  Syncope, vasovagal 07/04/2021   Type 2 diabetes mellitus with hyperlipidemia (HCC) 02/16/2022   Urinary incontinence    takes Linzess  daily   Vaginitis and vulvovaginitis 06/23/2016   Vertigo    hx of;was taking Meclizine  if needed   Wears dentures     PAST SURGICAL HISTORY: Past Surgical History:  Procedure Laterality Date   ABDOMINAL HYSTERECTOMY     partial   APPENDECTOMY     blood clots/legs and lungs  2013   BREAST BIOPSY Left 07/22/2014   BREAST BIOPSY Left 02/10/2013    BREAST LUMPECTOMY Left 11/05/2014   BREAST LUMPECTOMY WITH RADIOACTIVE SEED LOCALIZATION Left 11/05/2014   Procedure: LEFT BREAST LUMPECTOMY WITH RADIOACTIVE SEED LOCALIZATION;  Surgeon: Vicenta Poli, MD;  Location: [facility] OR;  Service: General;  Laterality: Left;   CARDIAC CATHETERIZATION     COLONOSCOPY     CORONARY ANGIOPLASTY  2   CORONARY STENT INTERVENTION N/A 02/17/2020   Procedure: CORONARY STENT INTERVENTION;  Surgeon: Anner Alm ORN, MD;  Location: [facility] INVASIVE CV LAB;  Service: Cardiovascular;  Laterality: N/A;   ESOPHAGOGASTRODUODENOSCOPY (EGD) WITH PROPOFOL  N/A 11/07/2016   Procedure: ESOPHAGOGASTRODUODENOSCOPY (EGD) WITH PROPOFOL ;  Surgeon: Avram Lupita BRAVO, MD;  Location: [facility] ENDOSCOPY;  Service: Endoscopy;  Laterality: N/A;   ESOPHAGOGASTRODUODENOSCOPY (EGD) WITH PROPOFOL  N/A 01/05/2023   Procedure: ESOPHAGOGASTRODUODENOSCOPY (EGD) WITH PROPOFOL ;  Surgeon: Abran Norleen SAILOR, MD;  Location: [facility] ENDOSCOPY;  Service: Gastroenterology;  Laterality: N/A;   EXCISION OF SKIN TAG Right 11/05/2014   Procedure: EXCISION OF RIGHT EYELID SKIN TAG;  Surgeon: Vicenta Poli, MD;  Location: [facility] OR;  Service: General;  Laterality: Right;   EYE SURGERY Bilateral    cataract    GASTRIC BYPASS  1977    reversed in [facility], [facility]   LEFT HEART CATH AND CORONARY ANGIOGRAPHY N/A 02/17/2020   Procedure: LEFT HEART CATH AND CORONARY ANGIOGRAPHY;  Surgeon: Anner Alm ORN, MD;  Location: [facility] INVASIVE CV LAB;  Service: Cardiovascular;  Laterality: N/A;   LEFT HEART CATHETERIZATION WITH CORONARY ANGIOGRAM N/A 06/29/2014   Procedure: LEFT HEART CATHETERIZATION WITH CORONARY ANGIOGRAM;  Surgeon: Debby DELENA Sor, MD;  Location: [facility] CATH LAB;  Service: Cardiovascular;  Laterality: N/A;   MEMBRANE PEEL Right 10/23/2018   Procedure: MEMBRANE PEEL;  Surgeon: Elner Arley DELENA, MD;  Location: [facility] OR;  Service: Ophthalmology;  Laterality: Right;   MI with stent placement  2004   PARS PLANA VITRECTOMY Right 10/23/2018    Procedure: PARS PLANA VITRECTOMY WITH 25 GAUGE;  Surgeon: Elner Arley DELENA, MD;  Location: [facility] OR;  Service: Ophthalmology;  Laterality: Right;    FAMILY HISTORY: Family History  Problem Relation Age of Onset   Breast cancer Mother 16   Heart disease Mother    Throat cancer Father    Hypertension Father    Arthritis Father    Diabetes Father    Arthritis Sister    Obesity Sister    Diabetes Sister    Heart disease Cousin    Colon cancer Neg Hx    Stomach cancer Neg Hx    Esophageal cancer Neg Hx     SOCIAL HISTORY: Social History   Socioeconomic History   Marital status: Widowed    Spouse name: Not on file   Number of children: 3   Years of education: Not on file   Highest education level: High school graduate  Occupational History   Occupation: retired  Tobacco Use   Smoking status: Never    Passive exposure: Past   Smokeless  tobacco: Never   Tobacco comments:    Both parents smoked, patient was exposed/ Raised up in smoke, my whole life.  Vaping Use   Vaping status: Never Used  Substance and Sexual Activity   Alcohol use: No    Alcohol/week: 0.0 standard drinks of alcohol   Drug use: No   Sexual activity: Not on file  Other Topics Concern   Not on file  Social History Narrative   Not on file   Social Drivers of Health   Financial Resource Strain: Low Risk  (09/28/2023)   Overall Financial Resource Strain (CARDIA)    Difficulty of Paying Living Expenses: Not hard at all  Food Insecurity: No Food Insecurity (10/05/2023)   Hunger Vital Sign    Worried About Running Out of Food in the Last Year: Never true    Ran Out of Food in the Last Year: Never true  Transportation Needs: No Transportation Needs (12/27/2023)   Received from [facility]   PRAPARE - Transportation    Lack of Transportation (Medical): No    Lack of Transportation (Non-Medical): No  Physical Activity: Inactive (09/28/2023)   Exercise Vital Sign    Days of Exercise per Week: 0  days    Minutes of Exercise per Session: 0 min  Stress: Stress Concern Present (09/28/2023)   [employer] of Occupational Health - Occupational Stress Questionnaire    Feeling of Stress : To some extent  Social Connections: Socially Isolated (10/05/2023)   Social Connection and Isolation Panel    Frequency of Communication with Friends and Family: Three times a week    Frequency of Social Gatherings with Friends and Family: More than three times a week    Attends Religious Services: Never    Database administrator or Organizations: No    Attends Banker Meetings: Never    Marital Status: Widowed  Intimate Partner Violence: Not At Risk (12/27/2023)   Received from [facility]   Humiliation, Afraid, Rape, and Kick questionnaire    Within the last year, have you been afraid of your partner or ex-partner?: No    Within the last year, have you been humiliated or emotionally abused in other ways by your partner or ex-partner?: No    Within the last year, have you been kicked, hit, slapped, or otherwise physically hurt by your partner or ex-partner?: No    Within the last year, have you been raped or forced to have any kind of sexual activity by your partner or ex-partner?: No      Modena Callander, M.D. Ph.D.  [facility] [address], [address] [place], [place] [address] Ph: [phone] Fax: [phone]  CC:  Sebastian Beverley NOVAK, MD [address] [place],  [place] [address]  Sebastian Beverley NOVAK, MD

## 2024-01-15 NOTE — Telephone Encounter (Signed)
 [facility] is going to take her.

## 2024-01-16 ENCOUNTER — Telehealth: Payer: Self-pay

## 2024-01-16 ENCOUNTER — Encounter: Payer: Self-pay | Admitting: Family Medicine

## 2024-01-16 ENCOUNTER — Ambulatory Visit (INDEPENDENT_AMBULATORY_CARE_PROVIDER_SITE_OTHER)

## 2024-01-16 ENCOUNTER — Ambulatory Visit: Admitting: Family Medicine

## 2024-01-16 VITALS — BP 127/61 | HR 79 | Temp 97.5°F | Resp 18 | Wt 268.1 lb

## 2024-01-16 DIAGNOSIS — D649 Anemia, unspecified: Secondary | ICD-10-CM

## 2024-01-16 DIAGNOSIS — Z86718 Personal history of other venous thrombosis and embolism: Secondary | ICD-10-CM | POA: Diagnosis not present

## 2024-01-16 DIAGNOSIS — S91111A Laceration without foreign body of right great toe without damage to nail, initial encounter: Secondary | ICD-10-CM

## 2024-01-16 DIAGNOSIS — K449 Diaphragmatic hernia without obstruction or gangrene: Secondary | ICD-10-CM | POA: Diagnosis not present

## 2024-01-16 DIAGNOSIS — N184 Chronic kidney disease, stage 4 (severe): Secondary | ICD-10-CM

## 2024-01-16 DIAGNOSIS — M85871 Other specified disorders of bone density and structure, right ankle and foot: Secondary | ICD-10-CM | POA: Diagnosis not present

## 2024-01-16 DIAGNOSIS — K2100 Gastro-esophageal reflux disease with esophagitis, without bleeding: Secondary | ICD-10-CM | POA: Diagnosis not present

## 2024-01-16 DIAGNOSIS — K921 Melena: Secondary | ICD-10-CM

## 2024-01-16 DIAGNOSIS — Z7901 Long term (current) use of anticoagulants: Secondary | ICD-10-CM | POA: Diagnosis not present

## 2024-01-16 DIAGNOSIS — Z23 Encounter for immunization: Secondary | ICD-10-CM

## 2024-01-16 DIAGNOSIS — M79674 Pain in right toe(s): Secondary | ICD-10-CM | POA: Diagnosis not present

## 2024-01-16 DIAGNOSIS — Z09 Encounter for follow-up examination after completed treatment for conditions other than malignant neoplasm: Secondary | ICD-10-CM

## 2024-01-16 LAB — CBC WITH DIFFERENTIAL/PLATELET
Basophils Absolute: 0 K/uL (ref 0.0–0.1)
Basophils Relative: 0.3 % (ref 0.0–3.0)
Eosinophils Absolute: 0.3 K/uL (ref 0.0–0.7)
Eosinophils Relative: 3.4 % (ref 0.0–5.0)
HCT: 26.7 % — ABNORMAL LOW (ref 36.0–46.0)
Hemoglobin: 8.5 g/dL — ABNORMAL LOW (ref 12.0–15.0)
Lymphocytes Relative: 24.7 % (ref 12.0–46.0)
Lymphs Abs: 2.2 K/uL (ref 0.7–4.0)
MCHC: 31.8 g/dL (ref 30.0–36.0)
MCV: 90 fl (ref 78.0–100.0)
Monocytes Absolute: 0.6 K/uL (ref 0.1–1.0)
Monocytes Relative: 6.6 % (ref 3.0–12.0)
Neutro Abs: 5.8 K/uL (ref 1.4–7.7)
Neutrophils Relative %: 65 % (ref 43.0–77.0)
Platelets: 239 K/uL (ref 150.0–400.0)
RBC: 2.97 Mil/uL — ABNORMAL LOW (ref 3.87–5.11)
RDW: 15.6 % — ABNORMAL HIGH (ref 11.5–15.5)
WBC: 8.9 K/uL (ref 4.0–10.5)

## 2024-01-16 NOTE — Patient Instructions (Signed)
 Jackquelyn CHRISTELLA Gaskins - I am sorry I was unable to reach you today for our scheduled appointment. I work with Sebastian Beverley NOVAK, MD and am calling to support your healthcare needs. Please contact me at [phone] at your earliest convenience. I look forward to speaking with you soon.   Thank you,   Heddy Shutter, RN, MSN, BSN, CCM [place]  [facility], Population Health Case Manager Phone: [phone]

## 2024-01-16 NOTE — Progress Notes (Signed)
 Assessment & Plan   Assessment/Plan:    Assessment & Plan Acute blood loss anemia secondary to chronic kidney disease and gastritis Recent hospitalization for acute blood loss anemia secondary to CKD and gastritis. Received two units of packed red blood cells and IV iron , with hemoglobin improving from 5.6 to 7.7 at discharge. Transitioned to oral PPI (pantoprazole ) for GI prophylaxis. Consideration of dose reduction of Eliquis  due to age, renal status, and bleeding risk, with referral to hematology for further discussion. - Order CBC to monitor hemoglobin levels. - Order CMP to assess renal function and electrolytes. - Refer to hematology for discussion on anemia management and potential dose reduction of Eliquis .  Chronic kidney disease stage 4 CKD stage 4, well-managed. Recent hospitalization included monitoring of renal function. - Order CMP to assess renal function and electrolytes.  Gastritis with hiatal hernia Gastritis with large hiatal hernia confirmed by EGD. Biopsy negative for H. pylori. Symptoms improved with pantoprazole , no longer requiring Tums. - Continue pantoprazole  40 mg daily for three months.  Chronic anticoagulation for deep vein thrombosis On chronic anticoagulation with Eliquis  5 mg BID for DVT. Discussion about potential dose reduction due to age, renal status, and bleeding risk. Risk of bleeding versus risk of thrombosis considered. - Refer to hematology for discussion on potential dose reduction of Eliquis .  Chronic foot wound, right great toe, without infection Chronic wound on the right great toe, present for over a month. No signs of infection, drainage, or significant pain. She reports it is healing. High risk for complications due to diabetes and neuropathy. - Order baseline x-ray of the right great toe to rule out underlying issues. - Schedule follow-up in one week to reassess the wound. - Refer to podiatry for further evaluation and management.  Type  2 diabetes mellitus with neuropathy Type 2 diabetes with neuropathy.  Potentially complicates wound healing and increases risk for amputation.      There are no discontinued medications.  Return in about 1 week (around 01/23/2024) for toe wound.        Subjective:   Encounter date: 01/16/2024  [name] is a 89 y.o. female who has Hyperlipidemia; Class 3 obesity; Symptomatic anemia; TENSION HEADACHE; Essential hypertension; History of DVT (deep vein thrombosis); Chronic diastolic CHF (congestive heart failure) (HCC); Chronic anticoagulation; Hiatal hernia with GERD and esophagitis; Weakness; Insomnia; Estrogen deficiency; COPD with chronic bronchitis (HCC); Drug-induced constipation; Vertigo; Gout; Vitamin D  deficiency; Endometrial polyp; GERD with esophagitis; Dementia (HCC); Moderate nonproliferative diabetic retinopathy of both eyes without macular edema associated with type 2 diabetes mellitus (HCC); Chronic radicular lumbar pain; Lumbar spondylosis; Spinal stenosis, lumbar region, with neurogenic claudication; Bilateral primary osteoarthritis of knee; Iron  deficiency anemia; Chronic pain of right hand; Breast cancer (HCC); Severe episode of recurrent major depressive disorder, without psychotic features (HCC); Pneumobilia; Dermatosis papulosa nigra; CKD (chronic kidney disease), stage IV (HCC); Type 2 diabetes mellitus with stage 4 chronic kidney disease, with long-term current use of insulin  (HCC); Melena; GAD (generalized anxiety disorder); Allergic rhinitis; Diabetic peripheral neuropathy associated with type 2 diabetes mellitus (HCC); Swelling of right upper extremity; Carpal tunnel syndrome on both sides; Very limited skin mobility; Chronic left hip pain; Dependent for medication administration; Chills; Upper GI bleed; CAD (coronary artery disease); Epigastric abdominal tenderness; Chronic ethmoidal sinusitis; ABLA (acute blood loss anemia); Recurrent acute suppurative otitis media  of right ear without spontaneous rupture of tympanic membrane; Viral URI with cough; Concha bullosa; Tinnitus of both ears; PND (post-nasal drip); Limited mobility; Hyperkalemia; Whole  body pain; Hyperglycemia due to diabetes mellitus (HCC); Unstable angina (HCC); Nonintractable headache; Gait abnormality; and Chronic migraine w/o aura w/o status migrainosus, not intractable on their problem list..   She  has a past medical history of Abdominal pain (01/26/2012), Acute deep vein thrombosis (DVT) of femoral vein of right lower extremity (HCC) (03/26/2022), Acute kidney injury superimposed on chronic kidney disease (HCC) (12/18/2014), Acute upper GI bleed (03/26/2022), Allergy , Anemia, unspecified, Anxiety, Anxiety associated with depression (06/08/2017), Arthritis, Breast cancer (HCC), Breast cancer screening (02/19/2014), Breast mass (10/27/2014), Chronic diastolic CHF (congestive heart failure) (HCC), Chronic pain syndrome (02/06/2015), CKD (chronic kidney disease), stage III (HCC), Coronary artery disease, Debility (08/01/2012), Dementia (HCC), Depression, Diabetes mellitus, DOE (dyspnea on exertion) (06/24/2014), Dysphagia, unspecified(787.20) (07/31/2012), Fungal dermatitis (11/13/2007), GERD (gastroesophageal reflux disease), Gout, unspecified, Headache, History of anemia due to chronic kidney disease (03/26/2022), History of blood clots, History of deep venous thrombosis (01/27/2012), History of pulmonary embolus (PE) (04/22/2014), Hyperkalemia (07/26/2017), Hyperlipidemia, Hypertension, Insomnia, Joint pain, Joint swelling, Morbid obesity (HCC), Multiple benign nevi (11/15/2016), Myocardial infarction (HCC) (2004), Non-STEMI (non-ST elevated myocardial infarction) (HCC) (02/15/2020), Nonintractable headache (01/07/2008), Numbness, Obstructive sleep apnea, Osteoarthritis, Osteoarthritis, Osteoarthrosis, unspecified whether generalized or localized, lower leg, Pain in the chest (12/31/2013), Pain, chronic,  Personal history of other diseases of the digestive system (12/03/2006), Polymyalgia rheumatica (HCC), Presence of stent in right coronary artery, Pulmonary emboli (HCC) (01/2012), Situational depression (06/04/2009), Syncope, vasovagal (07/04/2021), Type 2 diabetes mellitus with hyperlipidemia (HCC) (02/16/2022), Urinary incontinence, Vaginitis and vulvovaginitis (06/23/2016), Vertigo, and Wears dentures..   She presents with chief complaint of Hospitalization Follow-up (ED follow up 12/26/2023 for anemia; no concerns //HM due - shingles and flu vaccine ( patient will schedule mammogram) ) .   Discussed the use of AI scribe software for clinical note transcription with the patient, who gave verbal consent to proceed.  History of Present Illness KENISHA LYNDS is an 89 year old female with CKD and gastritis who presents for follow-up after hospitalization for acute blood loss anemia. She is accompanied by her daughter, Sharlet Kraft, who is her primary caregiver.  Acute blood loss anemia - Hospitalized at [facility] from August 20 to December 28, 2023, for acute blood loss anemia secondary to CKD and gastritis - Experienced syncope during hospitalization, likely due to anemia - Received two units of packed red blood cells, with hemoglobin improving from 5.6 to 7.7 at discharge - No recent episodes of melena or blood in stool since discharge - Not taking oral iron  supplements since discharge Telephone follow-up on 01/02/2024 Patient reports good compliance with treatment Condition improved since discharge  Gastrointestinal symptoms - Large hiatal hernia with mild gastritis confirmed by EGD, negative for H. pylori - Transitioned from IV Protonix  to oral pantoprazole  40 mg daily for three months for GI prophylaxis - Improvement in heartburn symptoms since starting pantoprazole  - No recent use of Tums  Chronic kidney disease - CKD stage 4, stable  Anticoagulation therapy -  Chronic anticoagulation with Eliquis  5 mg BID for history of DVT  Cutaneous ulceration - Cut under big toe present for about a month - Treated with strips, solution, and Neosporin - No bleeding, drainage, or significant pain - Appears to be healing - Occasional pain in big toe  Chronic pain - Occasional pain in arm and pelvis  Cognitive impairment - Dementia, stable  Cardiopulmonary disease - Coronary artery disease and congestive heart failure, stable - Chronic obstructive pulmonary disease, stable  Diabetes mellitus with neuropathy - Type 2  diabetes with neuropathy - Insulin  glargine Toujeo  among current medications  Obesity and mobility limitation - Morbid obesity with limited mobility - Wheelchair dependent most of the time     ROS  Past Surgical History:  Procedure Laterality Date   ABDOMINAL HYSTERECTOMY     partial   APPENDECTOMY     blood clots/legs and lungs  2013   BREAST BIOPSY Left 07/22/2014   BREAST BIOPSY Left 02/10/2013   BREAST LUMPECTOMY Left 11/05/2014   BREAST LUMPECTOMY WITH RADIOACTIVE SEED LOCALIZATION Left 11/05/2014   Procedure: LEFT BREAST LUMPECTOMY WITH RADIOACTIVE SEED LOCALIZATION;  Surgeon: Vicenta Poli, MD;  Location: [facility] OR;  Service: General;  Laterality: Left;   CARDIAC CATHETERIZATION     COLONOSCOPY     CORONARY ANGIOPLASTY  2   CORONARY STENT INTERVENTION N/A 02/17/2020   Procedure: CORONARY STENT INTERVENTION;  Surgeon: Anner Alm ORN, MD;  Location: [facility] INVASIVE CV LAB;  Service: Cardiovascular;  Laterality: N/A;   ESOPHAGOGASTRODUODENOSCOPY (EGD) WITH PROPOFOL  N/A 11/07/2016   Procedure: ESOPHAGOGASTRODUODENOSCOPY (EGD) WITH PROPOFOL ;  Surgeon: Avram Lupita BRAVO, MD;  Location: [facility] ENDOSCOPY;  Service: Endoscopy;  Laterality: N/A;   ESOPHAGOGASTRODUODENOSCOPY (EGD) WITH PROPOFOL  N/A 01/05/2023   Procedure: ESOPHAGOGASTRODUODENOSCOPY (EGD) WITH PROPOFOL ;  Surgeon: Abran Norleen SAILOR, MD;  Location: [facility] ENDOSCOPY;  Service:  Gastroenterology;  Laterality: N/A;   EXCISION OF SKIN TAG Right 11/05/2014   Procedure: EXCISION OF RIGHT EYELID SKIN TAG;  Surgeon: Vicenta Poli, MD;  Location: [facility] OR;  Service: General;  Laterality: Right;   EYE SURGERY Bilateral    cataract    GASTRIC BYPASS  1977    reversed in 1979, [name]   LEFT HEART CATH AND CORONARY ANGIOGRAPHY N/A 02/17/2020   Procedure: LEFT HEART CATH AND CORONARY ANGIOGRAPHY;  Surgeon: Anner Alm ORN, MD;  Location: [facility] INVASIVE CV LAB;  Service: Cardiovascular;  Laterality: N/A;   LEFT HEART CATHETERIZATION WITH CORONARY ANGIOGRAM N/A 06/29/2014   Procedure: LEFT HEART CATHETERIZATION WITH CORONARY ANGIOGRAM;  Surgeon: Debby DELENA Sor, MD;  Location: [facility] CATH LAB;  Service: Cardiovascular;  Laterality: N/A;   MEMBRANE PEEL Right 10/23/2018   Procedure: MEMBRANE PEEL;  Surgeon: Elner Arley DELENA, MD;  Location: [facility] OR;  Service: Ophthalmology;  Laterality: Right;   MI with stent placement  2004   PARS PLANA VITRECTOMY Right 10/23/2018   Procedure: PARS PLANA VITRECTOMY WITH 25 GAUGE;  Surgeon: Elner Arley DELENA, MD;  Location: [facility] OR;  Service: Ophthalmology;  Laterality: Right;    Outpatient Medications Prior to Visit  Medication Sig Dispense Refill   albuterol  (PROAIR  HFA) 108 (90 Base) MCG/ACT inhaler Inhale 1-2 puffs into the lungs every 6 (six) hours as needed for wheezing or shortness of breath. 6.7 g 1   amitriptyline  (ELAVIL ) 75 MG tablet Take 1 tablet (75 mg total) by mouth at bedtime. 90 tablet 0   atorvastatin  (LIPITOR ) 80 MG tablet TAKE 1 TABLET BY MOUTH EVERY DAY 90 tablet 3   azelastine  (ASTELIN ) 0.1 % nasal spray Place 2 sprays into both nostrils 2 (two) times daily. 30 mL 12   calcitRIOL  (ROCALTROL ) 0.25 MCG capsule Take 0.25 mcg by mouth daily.     carvedilol  (COREG ) 12.5 MG tablet TAKE 1 TABLET BY MOUTH 2 TIMES DAILY 180 tablet 1   cephALEXin  (KEFLEX ) 500 MG capsule Take 500 mg by mouth 2 (two) times daily.     cetirizine  (ZYRTEC ) 10 MG tablet  Take 10 mg by mouth daily as needed for allergies or rhinitis.  Continuous Glucose Receiver (FREESTYLE LIBRE 3 READER) DEVI Continuous glucose monitor 1 each 0   Continuous Glucose Sensor (FREESTYLE LIBRE 3 PLUS SENSOR) MISC 1 Device by Other route every 14 (fourteen) days. Change sensor every 15 days. 6 each 3   diclofenac  Sodium (VOLTAREN ) 1 % GEL Apply 2 g topically 4 (four) times daily. 150 g 2   doxycycline  (VIBRA -TABS) 100 MG tablet Take 100 mg by mouth 2 (two) times daily. for 10 days     ELIQUIS  5 MG TABS tablet TAKE 1 TABLET BY MOUTH 2 TIMES DAILY 180 tablet 3   febuxostat  (ULORIC ) 40 MG tablet TAKE 1 TABLET BY MOUTH EVERY DAY 90 tablet 3   fluticasone  (FLONASE ) 50 MCG/ACT nasal spray Place 2 sprays into both nostrils daily. 16 g 6   folic acid  (FOLVITE ) 1 MG tablet TAKE 1 TABLET BY MOUTH EVERY DAY 90 tablet 1   gabapentin  (NEURONTIN ) 100 MG capsule Take 1 capsule (100 mg total) by mouth at bedtime. 90 capsule 3   glucagon  1 MG injection Inject 1 mg into the vein once as needed for up to 1 dose. 1 each 12   glucose 4 GM chewable tablet Chew 1 tablet (4 g total) by mouth as needed for low blood sugar. 50 tablet 12   hydrOXYzine  (ATARAX ) 25 MG tablet TAKE 1 TABLET BY MOUTH EVERY 8 HOURS AS NEEDED FOR ANXIETY OR ITCHING (NASAL AND THROAT CONGESTION) 120 tablet 0   insulin  glargine, 2 Unit Dial , (TOUJEO  MAX SOLOSTAR) 300 UNIT/ML Solostar Pen Inject 40 units subcutaneously one daily 15 mL 3   ipratropium-albuterol  (DUONEB) 0.5-2.5 (3) MG/3ML SOLN Take 3 mLs by nebulization every 6 (six) hours as needed (cough, wheezing, or shortness of breath). 360 mL 0   linaclotide  (LINZESS ) 145 MCG CAPS capsule TAKE 1 CAPSULE BY MOUTH ONCE DAILY BEFORE BREAKFAST     meclizine  (ANTIVERT ) 25 MG tablet Take 1 tablet (25 mg total) by mouth 2 (two) times daily. 180 tablet 1   melatonin 5 MG TABS Take 1 tablet (5 mg total) by mouth at bedtime as needed. 15 tablet 0   memantine  (NAMENDA ) 5 MG tablet TAKE 2  TABLETS BY MOUTH EVERY MORNING and TAKE 1 TABLET EVERY EVENING 270 tablet 5   montelukast  (SINGULAIR ) 10 MG tablet Take 1 tablet (10 mg total) by mouth at bedtime. 30 tablet 3   mupirocin ointment (BACTROBAN) 2 % Apply 1 Application topically.     nitroGLYCERIN  (NITROSTAT ) 0.4 MG SL tablet Dissolve 1 tablet under the tongue every 5 minutes as needed for chest pain. Max of 3 doses, then 911. 25 tablet 6   nystatin  (MYCOSTATIN /NYSTOP ) powder Apply 1 Application topically 3 (three) times daily. 60 g 3   ondansetron  (ZOFRAN ) 4 MG tablet TAKE 1 TABLET BY MOUTH EVERY 8 HOURS AS NEEDED FOR NAUSEA FOR VOMITING     ondansetron  (ZOFRAN -ODT) 4 MG disintegrating tablet Take 1 tablet (4 mg total) by mouth every 8 (eight) hours as needed for nausea or vomiting. 20 tablet 0   oxyCODONE -acetaminophen  (PERCOCET) 7.5-325 MG tablet Take 1 tablet by mouth every 12 (twelve) hours.     pantoprazole  (PROTONIX ) 20 MG tablet TAKE 1 TABLET BY MOUTH ONCE DAILY 30 tablet 5   ranolazine  (RANEXA ) 500 MG 12 hr tablet Take 1 tablet (500 mg total) by mouth 2 (two) times daily. 60 tablet 1   Semaglutide  (RYBELSUS ) 7 MG TABS Take 1 tablet (7 mg total) by mouth daily. 30 tablet 1   sodium zirconium cyclosilicate  (  LOKELMA ) 10 g PACK packet USE 1 PACKET BY MOUTH AS DIRECTED DAILY     torsemide  (DEMADEX ) 20 MG tablet TAKE 1 TABLET BY MOUTH EVERY DAY MAY TAKE AN EXTRA DOES FOR WEIGHT GAIN OF 3LBs IN 1 DAY OR 5LBS IN 1 WEEK OR FOR WORSENING SWELLING 30 tablet 1   traMADol  (ULTRAM ) 50 MG tablet TAKE 1 TABLET BY MOUTH EVERY 12 HOURS AS NEEDED TAKE THE FIRST 4 DOSES WITH ZOFRAN  TO MINIMIZE NAUSEA     TYLENOL  500 MG tablet Take 1,000 mg by mouth every 6 (six) hours as needed for mild pain or headache.     TYLENOL  PM EXTRA STRENGTH 500-25 MG TABS tablet Take 2 tablets by mouth at bedtime.     Ubrogepant  (UBRELVY ) 50 MG TABS Take 1 tab at onset of migraine.  May repeat in 2 hrs, if needed.  Max dose: 2 tabs/day. This is a 30 day prescription. 12  tablet 6   Vitamin D , Ergocalciferol , (DRISDOL ) 1.25 MG (50000 UNIT) CAPS capsule TAKE 1 CAPSULE BY MOUTH ONCE WEEKLY ON MONDAY 4 capsule 1   NOVOLOG  FLEXPEN 100 UNIT/ML FlexPen INJECT 14 UNITS SUBCUTANEOUSLY TWICE DAILY WITH A MEAL . DO NOT EXCEED 70 PER 24 HOURS (Patient not taking: Reported on 01/16/2024) 15 mL 0   No facility-administered medications prior to visit.    Family History  Problem Relation Age of Onset   Breast cancer Mother 36   Heart disease Mother    Throat cancer Father    Hypertension Father    Arthritis Father    Diabetes Father    Arthritis Sister    Obesity Sister    Diabetes Sister    Heart disease Cousin    Colon cancer Neg Hx    Stomach cancer Neg Hx    Esophageal cancer Neg Hx     Social History   Socioeconomic History   Marital status: Widowed    Spouse name: Not on file   Number of children: 3   Years of education: Not on file   Highest education level: High school graduate  Occupational History   Occupation: retired  Tobacco Use   Smoking status: Never    Passive exposure: Past   Smokeless tobacco: Never   Tobacco comments:    Both parents smoked, patient was exposed/ Raised up in smoke, my whole life.  Vaping Use   Vaping status: Never Used  Substance and Sexual Activity   Alcohol use: No    Alcohol/week: 0.0 standard drinks of alcohol   Drug use: No   Sexual activity: Not Currently    Birth control/protection: None  Other Topics Concern   Not on file  Social History Narrative   Not on file   Social Drivers of Health   Financial Resource Strain: Low Risk  (09/28/2023)   Overall Financial Resource Strain (CARDIA)    Difficulty of Paying Living Expenses: Not hard at all  Food Insecurity: No Food Insecurity (10/05/2023)   Hunger Vital Sign    Worried About Running Out of Food in the Last Year: Never true    Ran Out of Food in the Last Year: Never true  Transportation Needs: No Transportation Needs (12/27/2023)   Received from  [facility]   PRAPARE - Transportation    Lack of Transportation (Medical): No    Lack of Transportation (Non-Medical): No  Physical Activity: Inactive (09/28/2023)   Exercise Vital Sign    Days of Exercise per Week: 0 days  Minutes of Exercise per Session: 0 min  Stress: Stress Concern Present (09/28/2023)   [employer] of Occupational Health - Occupational Stress Questionnaire    Feeling of Stress : To some extent  Social Connections: Socially Isolated (10/05/2023)   Social Connection and Isolation Panel    Frequency of Communication with Friends and Family: Three times a week    Frequency of Social Gatherings with Friends and Family: More than three times a week    Attends Religious Services: Never    Database administrator or Organizations: No    Attends Banker Meetings: Never    Marital Status: Widowed  Intimate Partner Violence: Not At Risk (12/27/2023)   Received from [facility]   Humiliation, Afraid, Rape, and Kick questionnaire    Within the last year, have you been afraid of your partner or ex-partner?: No    Within the last year, have you been humiliated or emotionally abused in other ways by your partner or ex-partner?: No    Within the last year, have you been kicked, hit, slapped, or otherwise physically hurt by your partner or ex-partner?: No    Within the last year, have you been raped or forced to have any kind of sexual activity by your partner or ex-partner?: No                                                                                                  Objective:  Physical Exam: BP 127/61 (BP Location: Left Arm, Patient Position: Sitting, Cuff Size: Large)   Pulse 79   Temp (!) 97.5 F (36.4 C) (Temporal)   Resp 18   Wt 268 lb 1.3 oz (121.6 kg)   SpO2 100%   BMI 41.99 kg/m     Physical Exam Constitutional:      Appearance: She is obese. She is not ill-appearing.  HENT:     Head: Normocephalic and  atraumatic.     Right Ear: Hearing, tympanic membrane and ear canal normal. No middle ear effusion.     Left Ear: Hearing, tympanic membrane and ear canal normal.     Nose: Nose normal.  Eyes:     General: No scleral icterus.       Right eye: No discharge.        Left eye: No discharge.     Extraocular Movements: Extraocular movements intact.  Cardiovascular:     Rate and Rhythm: Normal rate and regular rhythm.     Heart sounds: Normal heart sounds.     Comments: No cyanosis, no JVD Pulmonary:     Effort: Pulmonary effort is normal.     Breath sounds: Normal breath sounds. No decreased air movement.     Comments: No auditory wheezing Abdominal:     General: Abdomen is flat. There is no distension.     Palpations: Abdomen is soft.     Tenderness: There is no abdominal tenderness.  Musculoskeletal:     Right lower leg: Edema present.     Left lower leg: Edema present.  Feet:     Right foot:  Skin integrity: Skin breakdown (Healing cut under big toe, no drainage, no redness, no swelling.) present.  Skin:    General: Skin is warm.     Findings: No rash.  Neurological:     General: No focal deficit present.     Mental Status: She is alert.     Cranial Nerves: No cranial nerve deficit.     Motor: Weakness (Wheelchair-bound) present.  Psychiatric:        Attention and Perception: She is attentive.        Judgment: Judgment is not impulsive.     CT CHEST ABDOMEN PELVIS WO CONTRAST Result Date: 11/28/2023 CLINICAL DATA:  chest pain and abdominal pain EXAM: CT CHEST, ABDOMEN AND PELVIS WITHOUT CONTRAST TECHNIQUE: Multidetector CT imaging of the chest, abdomen and pelvis was performed following the standard protocol without IV contrast. RADIATION DOSE REDUCTION: This exam was performed according to the departmental dose-optimization program which includes automated exposure control, adjustment of the mA and/or kV according to patient size and/or use of iterative reconstruction  technique. COMPARISON:  CT of the abdomen and pelvis dated June 04, 2023. FINDINGS: CT CHEST FINDINGS Cardiovascular: Severe calcific coronary artery disease. The heart is normal in size. There is no pericardial effusion. The thoracic aorta demonstrates mild to moderate diffuse calcific atheromatous disease. Mediastinum/Nodes: The trachea and mainstem bronchi are unremarkable. The subacute appears normal. There are few shotty mediastinal lymph nodes. Lungs/Pleura: There are few ground-glass and reticular opacities present dependently within the lower lobes bilaterally. There is mild bronchiolectasis also present within the lower lobes. There are few hazy and reticular opacities present laterally within the right upper lobe. There are no pleural effusions present. No pneumothorax. Musculoskeletal: The bones are intact. There is no apparent soft tissue injury. CT ABDOMEN PELVIS FINDINGS Hepatobiliary: There are calcified stones lying dependently within the gallbladder. There is no evidence of cholecystitis. The liver is unremarkable. There is no biliary ductal dilatation. Pancreas: Normal. Spleen: Negative. Adrenals/Urinary Tract: The adrenal glands are unremarkable. There are simple appearing cysts within the kidneys bilaterally. No solid lesions or calculi are evident. There is no evidence of acute traumatic injury. The ureters are normal in caliber and free of calculi. The urinary bladder is unremarkable. Stomach/Bowel: Surgical clips are noted within the proximal stomach. There are small bowel anastomosis sutures present bilaterally. There is no evidence of acute bowel injury. There are several sigmoid diverticula present. There is no evidence of diverticulitis. Vascular/Lymphatic: The abdominal aorta demonstrates moderate calcific atheromatous disease. It is normal in caliber. Reproductive: The uterus and adnexa are unremarkable. Other: The patient is status post abdominal herniorrhaphy. There is no free fluid  or pelvic lymphadenopathy present. Musculoskeletal: Extensive degenerative changes throughout the lumbar spine. No evidence of acute traumatic injury. The bony pelvis is intact. IMPRESSION: 1. No evidence of acute traumatic injury of the chest, abdomen or pelvis. 2. Status post gastric surgery and partial small-bowel resection. Electronically Signed   By: Evalene Coho M.D.   On: 11/28/2023 14:22   CT Head Wo Contrast Result Date: 11/28/2023 CLINICAL DATA:  Headache, increasing frequency or severity EXAM: CT HEAD WITHOUT CONTRAST TECHNIQUE: Contiguous axial images were obtained from the base of the skull through the vertex without intravenous contrast. RADIATION DOSE REDUCTION: This exam was performed according to the departmental dose-optimization program which includes automated exposure control, adjustment of the mA and/or kV according to patient size and/or use of iterative reconstruction technique. COMPARISON:  CT the head dated Sep 25, 2023. FINDINGS: Brain: There  is age related atrophy and moderate diffuse cerebral white matter disease. There is no evidence of hemorrhage, mass, acute cortical infarct or hydrocephalus. There is a dystrophic calcification again demonstrated within the pons posterolaterally on the right. There is also prominent dystrophic calcification along the left tentorial leaflet anteriorly, as before. Vascular: Mild to moderate calcific atheromatous disease within the carotid siphons. Skull: Hyperostosis frontalis interna, normal variant. No osseous lesions present. Sinuses/Orbits: Moderate opacification of the right frontal and anterior ethmoid air cells. Status post bilateral lens replacement. Other: None. IMPRESSION: 1. Age-related atrophy and moderate diffuse cerebral white matter disease. No apparent acute process. 2. Right frontal and ethmoid sinus disease. Electronically Signed   By: Evalene Coho M.D.   On: 11/28/2023 14:12   US  Venous Img Lower Right (DVT  Study) Result Date: 11/23/2023 CLINICAL DATA:  Right lower leg swelling x1 week. EXAM: RIGHT LOWER EXTREMITY VENOUS DOPPLER ULTRASOUND TECHNIQUE: Gray-scale sonography with graded compression, as well as color Doppler and duplex ultrasound were performed to evaluate the lower extremity deep venous systems from the level of the common femoral vein and including the common femoral, femoral, profunda femoral, popliteal and calf veins including the posterior tibial, peroneal and gastrocnemius veins when visible. The superficial great saphenous vein was also interrogated. Spectral Doppler was utilized to evaluate flow at rest and with distal augmentation maneuvers in the common femoral, femoral and popliteal veins. COMPARISON:  None Available. FINDINGS: Contralateral Common Femoral Vein: Respiratory phasicity is normal and symmetric with the symptomatic side. No evidence of thrombus. Normal compressibility. Common Femoral Vein: No evidence of thrombus. Normal compressibility, respiratory phasicity and response to augmentation. Saphenofemoral Junction: No evidence of thrombus. Normal compressibility and flow on color Doppler imaging. Profunda Femoral Vein: No evidence of thrombus. Normal compressibility and flow on color Doppler imaging. Femoral Vein: The RIGHT femoral vein is limited in visualization. Popliteal Vein: No evidence of thrombus. Normal compressibility, respiratory phasicity and response to augmentation. Calf Veins: The RIGHT posterior tibial vein is limited in visualization, while the RIGHT peroneal vein is not visualized. Superficial Great Saphenous Vein: No evidence of thrombus. Normal compressibility. Venous Reflux:  None. Other Findings: The study is technically limited secondary to the patient's body habitus and shadowing from arterial calcifications, as per the ultrasound technologist. IMPRESSION: Limited evaluation of the RIGHT femoral vein, RIGHT posterior tibial vein and RIGHT peroneal vein,  without evidence of DVT within the RIGHT extremity. Electronically Signed   By: Suzen Dials M.D.   On: 11/23/2023 19:29   DG Chest Port 1 View Result Date: 11/23/2023 CLINICAL DATA:  [id number] Leg swelling [id number] EXAM: PORTABLE CHEST - 1 VIEW COMPARISON:  None available. FINDINGS: Low lung volumes. Bilateral perihilar interstitial opacities. No focal airspace consolidation or pneumothorax. No cardiomegaly. Tortuous aorta with aortic atherosclerosis. No acute fracture or destructive lesions. Multilevel thoracic osteophytosis. IMPRESSION: Low lung volumes. Bilateral perihilar interstitial opacities, which may reflect bronchovascular crowding, interstitial edema, or atypical/viral infection. Electronically Signed   By: Rogelia Myers M.D.   On: 11/23/2023 17:39   TEMPORAL ARTERY Result Date: 11/20/2023  TEMPORAL ARTERY REPORT Patient Name:  [name]  Date of Exam:   11/19/2023 Medical Rec #: [id number]        Accession #:    [id number] Date of Birth: 11/15/1934        Patient Gender: F Patient Age:   56 years Exam Location:  [facility] Procedure:      VAS US  TEMPORAL ARTERY BILATERAL Referring Phys: MCNEILL KIRKPATRICK --------------------------------------------------------------------------------  Indications: Patient admitted to hospital 09/13/2023 with asymmetric temporal              pulses, double vision, left sided headache, and lab work consistent              with GCA. Patient treated with prednisone . High Risk Factors: Age > 50 yrs and female.  Comparison Study: No prior study Performing Technologist: Alberta Lis RVS  Examination Guidelines: Patient in reclined position. 2D, color and spectral doppler sampling in the temporal artery along the hairline and temple in the longitudinal plane. 2D images along the hairline and temple in the transverse plane. Exam is bilateral.  No Halo noted in the bilateral temporal arteries. Bilateral temporal arteries are compressible. Summary:  *See  table(s) above for measurements and observations.  Electronically signed by Eather Popp MD on 11/20/2023 at 11:35:26 AM.   Final     Recent Results (from the past 2160 hours)  Comprehensive metabolic panel     Status: Abnormal   Collection Time: 11/23/23  5:11 PM  Result Value Ref Range   Sodium 141 135 - 145 mmol/L   Potassium 4.5 3.5 - 5.1 mmol/L   Chloride 103 98 - 111 mmol/L   CO2 25 22 - 32 mmol/L   Glucose, Bld 77 70 - 99 mg/dL    Comment: Glucose reference range applies only to samples taken after fasting for at least 8 hours.   BUN 60 (H) 8 - 23 mg/dL   Creatinine, Ser 7.36 (H) 0.44 - 1.00 mg/dL   Calcium  8.7 (L) 8.9 - 10.3 mg/dL   Total Protein 7.3 6.5 - 8.1 g/dL   Albumin  3.5 3.5 - 5.0 g/dL   AST 18 15 - 41 U/L   ALT 9 0 - 44 U/L   Alkaline Phosphatase 94 38 - 126 U/L   Total Bilirubin 0.3 0.0 - 1.2 mg/dL   GFR, Estimated 17 (L) >60 mL/min    Comment: (NOTE) Calculated using the CKD-EPI Creatinine Equation (2021)    Anion gap 13 5 - 15    Comment: Performed at [facility], [address] [place], [place] [address]  CBC with Differential     Status: Abnormal   Collection Time: 11/23/23  5:11 PM  Result Value Ref Range   WBC 11.6 (H) 4.0 - 10.5 K/uL   RBC 2.84 (L) 3.87 - 5.11 MIL/uL   Hemoglobin 8.4 (L) 12.0 - 15.0 g/dL   HCT 72.4 (L) 63.9 - 53.9 %   MCV 96.8 80.0 - 100.0 fL   MCH 29.6 26.0 - 34.0 pg   MCHC 30.5 30.0 - 36.0 g/dL   RDW 84.0 (H) 88.4 - 84.4 %   Platelets 301 150 - 400 K/uL   nRBC 0.2 0.0 - 0.2 %   Neutrophils Relative % 60 %   Neutro Abs 7.0 1.7 - 7.7 K/uL   Lymphocytes Relative 29 %   Lymphs Abs 3.3 0.7 - 4.0 K/uL   Monocytes Relative 7 %   Monocytes Absolute 0.8 0.1 - 1.0 K/uL   Eosinophils Relative 3 %   Eosinophils Absolute 0.3 0.0 - 0.5 K/uL   Basophils Relative 0 %   Basophils Absolute 0.0 0.0 - 0.1 K/uL   Immature Granulocytes 1 %   Abs Immature Granulocytes 0.16 (H) 0.00 - 0.07 K/uL    Comment: Performed at [facility], [address] [place], [place] [address]  Pro Brain natriuretic peptide     Status: Abnormal   Collection  Time: 11/23/23  5:11 PM  Result Value Ref Range   Pro Brain Natriuretic Peptide 688.0 (H) <300.0 pg/mL    Comment: (NOTE) Age Group        Cut-Points    Interpretation  < 50 years     450 pg/mL       NT-proBNP > 450 pg/mL indicates                                ADHF is likely              50 to 75 years  900 pg/mL      NT-proBNP > 900 pg/mL indicates          ADHF is likely  > 75 years      1800 pg/mL     NT-proBNP > 1800 pg/mL indicates          ADHF is likely                           All ages    Results between       Indeterminate. Further clinical             300 and the cut-   information is needed to determine            point for age group   if ADHF is present.                                                             Elecsys proBNP II/ Elecsys proBNP II STAT           Cut-Point                       Interpretation  300 pg/mL                    NT-proBNP <300pg/mL indicates                             ADHF is not likely  Performed at [facility], [address], [place], [place] [address]   Troponin T, High Sensitivity     Status: Abnormal   Collection Time: 11/23/23  5:11 PM  Result Value Ref Range   Troponin T High Sensitivity 61 (H) <19 ng/L    Comment: (NOTE) Biotin concentrations > 1000 ng/mL falsely decrease TnT results.  Serial cardiac troponin measurements are suggested.  Refer to the Links section for chest pain algorithms and additional  guidance. Performed at [facility], [address] [place], [place] [address]   Troponin T, High Sensitivity     Status: Abnormal   Collection Time: 11/23/23  7:12 PM  Result Value Ref Range   Troponin T High Sensitivity 59 (H) <19 ng/L    Comment: (NOTE) Biotin concentrations > 1000 ng/mL falsely decrease TnT results.  Serial cardiac  troponin measurements are suggested.  Refer to the Links section for chest pain algorithms and additional  guidance. Performed at [facility], [address], [place], [place] [address]   Iron  and Iron  Binding Capacity ([facility] only)  Status: Abnormal   Collection Time: 11/26/23  1:16 PM  Result Value Ref Range   Iron  50 28 - 170 ug/dL   TIBC 778 (L) 749 - 549 ug/dL   Saturation Ratios 23 10.4 - 31.8 %   UIBC 171 148 - 442 ug/dL    Comment: Performed at [facility], [address], [place], [place] [address]  Ferritin     Status: None   Collection Time: 11/26/23  1:16 PM  Result Value Ref Range   Ferritin 210 11 - 307 ng/mL    Comment: Performed at [facility], [address] [place], [place] [address]  CMP (Cancer Center only)     Status: Abnormal   Collection Time: 11/26/23  1:16 PM  Result Value Ref Range   Sodium 140 135 - 145 mmol/L   Potassium 4.2 3.5 - 5.1 mmol/L   Chloride 104 98 - 111 mmol/L   CO2 28 22 - 32 mmol/L   Glucose, Bld 157 (H) 70 - 99 mg/dL    Comment: Glucose reference range applies only to samples taken after fasting for at least 8 hours.   BUN 68 (H) 8 - 23 mg/dL   Creatinine 7.16 (H) 9.55 - 1.00 mg/dL   Calcium  8.4 (L) 8.9 - 10.3 mg/dL   Total Protein 7.0 6.5 - 8.1 g/dL   Albumin  3.2 (L) 3.5 - 5.0 g/dL   AST 12 (L) 15 - 41 U/L   ALT 6 0 - 44 U/L   Alkaline Phosphatase 79 38 - 126 U/L   Total Bilirubin 0.4 0.0 - 1.2 mg/dL   GFR, Estimated 16 (L) >60 mL/min    Comment: (NOTE) Calculated using the CKD-EPI Creatinine Equation (2021)    Anion gap 8 5 - 15    Comment: Performed at [facility], [address], [place], [place] [address]  CBC with Differential (Cancer Center Only)     Status: Abnormal   Collection Time: 11/26/23  1:16 PM  Result Value Ref Range   WBC Count 13.5 (H) 4.0 - 10.5 K/uL   RBC 2.78 (L) 3.87 - 5.11 MIL/uL   Hemoglobin 8.0 (L)  12.0 - 15.0 g/dL   HCT 73.4 (L) 63.9 - 53.9 %   MCV 95.3 80.0 - 100.0 fL   MCH 28.8 26.0 - 34.0 pg   MCHC 30.2 30.0 - 36.0 g/dL   RDW 84.2 (H) 88.4 - 84.4 %   Platelet Count 278 150 - 400 K/uL   nRBC 0.2 0.0 - 0.2 %   Neutrophils Relative % 67 %   Neutro Abs 9.1 (H) 1.7 - 7.7 K/uL   Lymphocytes Relative 22 %   Lymphs Abs 3.0 0.7 - 4.0 K/uL   Monocytes Relative 7 %   Monocytes Absolute 0.9 0.1 - 1.0 K/uL   Eosinophils Relative 3 %   Eosinophils Absolute 0.3 0.0 - 0.5 K/uL   Basophils Relative 0 %   Basophils Absolute 0.1 0.0 - 0.1 K/uL   Immature Granulocytes 1 %   Abs Immature Granulocytes 0.16 (H) 0.00 - 0.07 K/uL    Comment: Performed at [facility], [address], [place], [place] [address]  CBC with Differential     Status: Abnormal   Collection Time: 11/28/23  1:34 PM  Result Value Ref Range   WBC 16.2 (H) 4.0 - 10.5 K/uL   RBC 2.52 (L) 3.87 - 5.11 MIL/uL   Hemoglobin 7.4 (L) 12.0 - 15.0 g/dL  HCT 24.7 (L) 36.0 - 46.0 %   MCV 98.0 80.0 - 100.0 fL   MCH 29.4 26.0 - 34.0 pg   MCHC 30.0 30.0 - 36.0 g/dL   RDW 84.4 88.4 - 84.4 %   Platelets 262 150 - 400 K/uL   nRBC 0.3 (H) 0.0 - 0.2 %   Neutrophils Relative % 71 %   Neutro Abs 11.4 (H) 1.7 - 7.7 K/uL   Lymphocytes Relative 18 %   Lymphs Abs 3.0 0.7 - 4.0 K/uL   Monocytes Relative 6 %   Monocytes Absolute 1.0 0.1 - 1.0 K/uL   Eosinophils Relative 3 %   Eosinophils Absolute 0.5 0.0 - 0.5 K/uL   Basophils Relative 0 %   Basophils Absolute 0.1 0.0 - 0.1 K/uL   Immature Granulocytes 2 %   Abs Immature Granulocytes 0.30 (H) 0.00 - 0.07 K/uL    Comment: Performed at [facility], [address], [place], [place] [address]  Comprehensive metabolic panel     Status: Abnormal   Collection Time: 11/28/23  1:34 PM  Result Value Ref Range   Sodium 133 (L) 135 - 145 mmol/L   Potassium 4.3 3.5 - 5.1 mmol/L   Chloride 100 98 - 111 mmol/L   CO2 21 (L) 22 - 32 mmol/L   Glucose, Bld 121 (H) 70 -  99 mg/dL    Comment: Glucose reference range applies only to samples taken after fasting for at least 8 hours.   BUN 73 (H) 8 - 23 mg/dL   Creatinine, Ser 6.90 (H) 0.44 - 1.00 mg/dL   Calcium  7.5 (L) 8.9 - 10.3 mg/dL   Total Protein 6.1 (L) 6.5 - 8.1 g/dL   Albumin  2.3 (L) 3.5 - 5.0 g/dL   AST 17 15 - 41 U/L   ALT 9 0 - 44 U/L   Alkaline Phosphatase 68 38 - 126 U/L   Total Bilirubin 0.4 0.0 - 1.2 mg/dL   GFR, Estimated 14 (L) >60 mL/min    Comment: (NOTE) Calculated using the CKD-EPI Creatinine Equation (2021)    Anion gap 12 5 - 15    Comment: Performed at [facility], [address], [place], [place] [address]  Lipase, blood     Status: None   Collection Time: 11/28/23  1:34 PM  Result Value Ref Range   Lipase 37 11 - 51 U/L    Comment: Performed at [facility], [address] [place], [place] [address]  Brain natriuretic peptide     Status: None   Collection Time: 11/28/23  1:34 PM  Result Value Ref Range   B Natriuretic Peptide 94.8 0.0 - 100.0 pg/mL    Comment: Performed at [facility], [address] [place], [place] [address]  Troponin I (High Sensitivity)     Status: None   Collection Time: 11/28/23  1:34 PM  Result Value Ref Range   Troponin I (High Sensitivity) 16 <18 ng/L    Comment: (NOTE) Elevated high sensitivity troponin I (hsTnI) values and significant  changes across serial measurements may suggest ACS but many other  chronic and acute conditions are known to elevate hsTnI results.  Refer to the Links section for chest pain algorithms and additional  guidance. Performed at [facility], [address], [place], [place] [address]   Urinalysis, Routine w reflex microscopic -Urine, Clean Catch     Status: Abnormal   Collection Time: 11/28/23  2:50 PM  Result  Value Ref Range   Color, Urine STRAW (A) YELLOW   APPearance CLEAR CLEAR   Specific Gravity, Urine 1.006 1.005 - 1.030   pH 5.0 5.0 - 8.0   Glucose, UA NEGATIVE NEGATIVE  mg/dL   Hgb urine dipstick NEGATIVE NEGATIVE   Bilirubin Urine NEGATIVE NEGATIVE   Ketones, ur NEGATIVE NEGATIVE mg/dL   Protein, ur NEGATIVE NEGATIVE mg/dL   Nitrite NEGATIVE NEGATIVE   Leukocytes,Ua NEGATIVE NEGATIVE    Comment: Performed at [facility], [address], [place], [place] [address]  POCT glycosylated hemoglobin (Hb A1C)     Status: Abnormal   Collection Time: 12/12/23  8:46 AM  Result Value Ref Range   Hemoglobin A1C 7.4 (A) 4.0 - 5.6 %   HbA1c POC (<> result, manual entry)     HbA1c, POC (prediabetic range)     HbA1c, POC (controlled diabetic range)          Beverley Adine Hummer, MD, MS

## 2024-01-16 NOTE — Patient Instructions (Addendum)
  VISIT SUMMARY: You had a follow-up visit after your recent hospitalization for acute blood loss anemia. We discussed your anemia, chronic kidney disease, gastritis, anticoagulation therapy, foot wound, and diabetes management.  YOUR PLAN: ACUTE BLOOD LOSS ANEMIA: You were recently hospitalized for anemia related to your chronic kidney disease and gastritis. Your hemoglobin levels improved after receiving blood transfusions. -We will monitor your hemoglobin levels with a CBC test. -We will check your kidney function and electrolytes with a CMP test. -You are referred to hematology to discuss anemia management and the potential dose reduction of Eliquis .  CHRONIC KIDNEY DISEASE STAGE 4: Your chronic kidney disease is stable but requires regular monitoring. -We will check your kidney function and electrolytes with a CMP test.  GASTRITIS WITH HIATAL HERNIA: You have gastritis and a large hiatal hernia, but your symptoms have improved with medication. -Continue taking pantoprazole  40 mg daily for three months.  CHRONIC ANTICOAGULATION FOR DEEP VEIN THROMBOSIS: You are on Eliquis  for a history of deep vein thrombosis. We need to consider a dose reduction due to your age and kidney function. -You are referred to hematology to discuss the potential dose reduction of Eliquis .  CHRONIC FOOT WOUND, RIGHT GREAT TOE, WITHOUT INFECTION: You have a chronic wound on your right big toe that is healing but needs monitoring due to your diabetes and neuropathy. -We will get an x-ray of your right big toe to rule out any underlying issues. -Schedule a follow-up in one week to reassess the wound. -You are referred to podiatry for further evaluation and management.                      Contains text generated by Abridge.                                 Contains text generated by Abridge.

## 2024-01-17 ENCOUNTER — Telehealth: Payer: Self-pay | Admitting: Neurology

## 2024-01-17 ENCOUNTER — Telehealth: Payer: Self-pay

## 2024-01-17 ENCOUNTER — Ambulatory Visit: Payer: Self-pay | Admitting: Family Medicine

## 2024-01-17 DIAGNOSIS — D631 Anemia in chronic kidney disease: Secondary | ICD-10-CM | POA: Diagnosis not present

## 2024-01-17 DIAGNOSIS — D509 Iron deficiency anemia, unspecified: Secondary | ICD-10-CM | POA: Diagnosis not present

## 2024-01-17 DIAGNOSIS — G4700 Insomnia, unspecified: Secondary | ICD-10-CM | POA: Diagnosis not present

## 2024-01-17 DIAGNOSIS — K838 Other specified diseases of biliary tract: Secondary | ICD-10-CM | POA: Diagnosis not present

## 2024-01-17 DIAGNOSIS — E1142 Type 2 diabetes mellitus with diabetic polyneuropathy: Secondary | ICD-10-CM | POA: Diagnosis not present

## 2024-01-17 DIAGNOSIS — F0394 Unspecified dementia, unspecified severity, with anxiety: Secondary | ICD-10-CM | POA: Diagnosis not present

## 2024-01-17 DIAGNOSIS — E1122 Type 2 diabetes mellitus with diabetic chronic kidney disease: Secondary | ICD-10-CM | POA: Diagnosis not present

## 2024-01-17 DIAGNOSIS — J322 Chronic ethmoidal sinusitis: Secondary | ICD-10-CM | POA: Diagnosis not present

## 2024-01-17 DIAGNOSIS — G5603 Carpal tunnel syndrome, bilateral upper limbs: Secondary | ICD-10-CM | POA: Diagnosis not present

## 2024-01-17 DIAGNOSIS — I130 Hypertensive heart and chronic kidney disease with heart failure and stage 1 through stage 4 chronic kidney disease, or unspecified chronic kidney disease: Secondary | ICD-10-CM | POA: Diagnosis not present

## 2024-01-17 DIAGNOSIS — F418 Other specified anxiety disorders: Secondary | ICD-10-CM | POA: Diagnosis not present

## 2024-01-17 DIAGNOSIS — F03918 Unspecified dementia, unspecified severity, with other behavioral disturbance: Secondary | ICD-10-CM | POA: Diagnosis not present

## 2024-01-17 DIAGNOSIS — F0393 Unspecified dementia, unspecified severity, with mood disturbance: Secondary | ICD-10-CM | POA: Diagnosis not present

## 2024-01-17 DIAGNOSIS — J4489 Other specified chronic obstructive pulmonary disease: Secondary | ICD-10-CM | POA: Diagnosis not present

## 2024-01-17 DIAGNOSIS — E113393 Type 2 diabetes mellitus with moderate nonproliferative diabetic retinopathy without macular edema, bilateral: Secondary | ICD-10-CM | POA: Diagnosis not present

## 2024-01-17 DIAGNOSIS — I5032 Chronic diastolic (congestive) heart failure: Secondary | ICD-10-CM | POA: Diagnosis not present

## 2024-01-17 DIAGNOSIS — M353 Polymyalgia rheumatica: Secondary | ICD-10-CM | POA: Diagnosis not present

## 2024-01-17 DIAGNOSIS — G43709 Chronic migraine without aura, not intractable, without status migrainosus: Secondary | ICD-10-CM | POA: Diagnosis not present

## 2024-01-17 DIAGNOSIS — N184 Chronic kidney disease, stage 4 (severe): Secondary | ICD-10-CM | POA: Diagnosis not present

## 2024-01-17 DIAGNOSIS — G4733 Obstructive sleep apnea (adult) (pediatric): Secondary | ICD-10-CM | POA: Diagnosis not present

## 2024-01-17 DIAGNOSIS — G894 Chronic pain syndrome: Secondary | ICD-10-CM | POA: Diagnosis not present

## 2024-01-17 DIAGNOSIS — I2510 Atherosclerotic heart disease of native coronary artery without angina pectoris: Secondary | ICD-10-CM | POA: Diagnosis not present

## 2024-01-17 DIAGNOSIS — K2100 Gastro-esophageal reflux disease with esophagitis, without bleeding: Secondary | ICD-10-CM | POA: Diagnosis not present

## 2024-01-17 DIAGNOSIS — I252 Old myocardial infarction: Secondary | ICD-10-CM | POA: Diagnosis not present

## 2024-01-17 LAB — BASIC METABOLIC PANEL WITH GFR
BUN: 76 mg/dL — ABNORMAL HIGH (ref 6–23)
CO2: 22 meq/L (ref 19–32)
Calcium: 8.7 mg/dL (ref 8.4–10.5)
Chloride: 105 meq/L (ref 96–112)
Creatinine, Ser: 2.94 mg/dL — ABNORMAL HIGH (ref 0.40–1.20)
GFR: 13.91 mL/min — CL (ref >60.00)
Glucose, Bld: 136 mg/dL — ABNORMAL HIGH (ref 70–99)
Potassium: 4.5 meq/L (ref 3.5–5.1)
Sodium: 137 meq/L (ref 135–145)

## 2024-01-17 NOTE — Telephone Encounter (Signed)
 CRITICAL VALUE STICKER  CRITICAL VALUE: GFR 13.91  RECEIVER (on-site recipient of call): Laurena Valko  DATE & TIME NOTIFIED: 01/17/24 1144  MESSENGER (representative from lab): Domnick  MD NOTIFIED: Sebastian  TIME OF NOTIFICATION: 1147  RESPONSE:  Provider aware for review. Pt has Stage IV CKD.

## 2024-01-17 NOTE — Telephone Encounter (Signed)
 Call to [facility], PT with [facility]. Gave verbal orders for PT as requested. Advised to follow up with PCP concerning pain and med interaction. Lorie voiced understanding.

## 2024-01-17 NOTE — Progress Notes (Signed)
 Noted

## 2024-01-17 NOTE — Telephone Encounter (Signed)
 Noted

## 2024-01-17 NOTE — Telephone Encounter (Signed)
 PT [name] @ [facility] has called for verbal orders for 1 week 9 strength and therapy. Pt has mentioned chronic pain in knees on a scale of 1-10 pt rates a 10 re: sit to stand transfer.  There is a med interaction of carvedilol  (COREG ) 12.5 MG tablet  and albuterol  (PROAIR  HFA) 108 (90 Base) MCG/ACT inhaler

## 2024-01-18 NOTE — Telephone Encounter (Signed)
Pt is wanting a cb.

## 2024-01-18 NOTE — Telephone Encounter (Signed)
 Lori  with [facility] called to request verbal order for pt  OT .  Callback # [phone]

## 2024-01-20 DIAGNOSIS — N184 Chronic kidney disease, stage 4 (severe): Secondary | ICD-10-CM | POA: Diagnosis not present

## 2024-01-20 DIAGNOSIS — M179 Osteoarthritis of knee, unspecified: Secondary | ICD-10-CM | POA: Diagnosis not present

## 2024-01-20 DIAGNOSIS — I5032 Chronic diastolic (congestive) heart failure: Secondary | ICD-10-CM | POA: Diagnosis not present

## 2024-01-20 DIAGNOSIS — M48062 Spinal stenosis, lumbar region with neurogenic claudication: Secondary | ICD-10-CM | POA: Diagnosis not present

## 2024-01-20 DIAGNOSIS — Z993 Dependence on wheelchair: Secondary | ICD-10-CM | POA: Diagnosis not present

## 2024-01-21 ENCOUNTER — Ambulatory Visit (INDEPENDENT_AMBULATORY_CARE_PROVIDER_SITE_OTHER): Admitting: Podiatry

## 2024-01-21 ENCOUNTER — Telehealth: Payer: Self-pay

## 2024-01-21 ENCOUNTER — Encounter: Payer: Self-pay | Admitting: Podiatry

## 2024-01-21 DIAGNOSIS — B351 Tinea unguium: Secondary | ICD-10-CM

## 2024-01-21 DIAGNOSIS — M79609 Pain in unspecified limb: Secondary | ICD-10-CM

## 2024-01-21 DIAGNOSIS — M21969 Unspecified acquired deformity of unspecified lower leg: Secondary | ICD-10-CM

## 2024-01-21 DIAGNOSIS — E1169 Type 2 diabetes mellitus with other specified complication: Secondary | ICD-10-CM

## 2024-01-21 NOTE — Telephone Encounter (Signed)
 Lori  with [facility] called to request verbal order for pt  OT .   Callback # [phone]  Vrebal orders given

## 2024-01-21 NOTE — Telephone Encounter (Signed)
 Forwarding message below. Please advise.

## 2024-01-21 NOTE — Telephone Encounter (Signed)
 Copied from CRM #[id number]. Topic: Clinical - Medication Question >> Jan 18, 2024  4:30 PM Dedra B wrote: Reason for CRM: Lorie, PT from [facility] called to report drug interaction between Carvedilol  and albuterol . She said pt takes Tylenol  2 500 mg tablets PRN and Tylenol  Pm extra strength at night. She wants to know if this is the way that the pt needs to take this. Lorie can be reached at [phone].

## 2024-01-21 NOTE — Progress Notes (Signed)
 This patient presents to the office with pain third toenail left foot and skin lesion under the right great toe.  The skin lesion has been present about 4 weeks but there is no drainage presently.  She presents to the office in a wheelchair and with a caregiver.  She has history of DM, CKD and dementia.  She has seen doctor who diagnosed her as having skin laceration bottom of right big toe.  General Appearance  Alert, conversant and in no acute stress.  Vascular  Dorsalis pedis and posterior tibial  pulses are  weakly palpable  bilaterally.  Capillary return is within normal limits  bilaterally. Temperature is within normal limits  bilaterally.  Neurologic  Senn-Weinstein monofilament wire test within normal limits  bilaterally. Muscle power within normal limits bilaterally.  Nails Thick disfigured discolored nails with subungual debris  from hallux to fifth toes left and 2-5 right foot.No evidence of bacterial infection or drainage bilaterally.She has moisture on her third and fourth toes right foot. Skin lesion medial aspect third toe left foot noted with no pus noted.  Orthopedic  No limitations of motion  feet .  No crepitus or effusions noted.  No bony pathology or digital deformities noted. Skin normotropic skin with no porokeratosis noted bilaterally.  Healing skin sulcus right hallux.  S/P healing skin laceration. Probable  Paronychia third toe left foot.   Debride nail third toe left foot.  Told her to use peroxide to third toe as well as under right hallux.  Neosporin/DSD third toe left foot.  Caretaker admitted to cutting nails.  Cordella Bold DPM

## 2024-01-22 ENCOUNTER — Telehealth: Payer: Self-pay

## 2024-01-22 ENCOUNTER — Ambulatory Visit

## 2024-01-22 NOTE — Progress Notes (Signed)
   01/22/2024  Patient ID: [name], female   DOB: 1935-02-17, 89 y.o.   MRN: [id number]  Incoming call from patient's daughter, [name] [facility]), stating the [name] app is currently working with [name] sensors, but they would like a new reader as a back up.  Patient requires transportation arrangements to come to patient visits, so daughter is just going to come see me today to pick up new [facility] reader.  Telephone visit is scheduled in 2 weeks to follow-up on control of diabetes.  [name], PharmD, DPLA

## 2024-01-22 NOTE — Telephone Encounter (Signed)
 Spoke with pt daughter Joesphine Kraft) and went over lab results. Pt daughter verbalized understanding

## 2024-01-24 NOTE — Telephone Encounter (Signed)
 Called lorie from PT [facility] and left VM with call back number for any additional questions or concerns if needed.

## 2024-01-26 DIAGNOSIS — J449 Chronic obstructive pulmonary disease, unspecified: Secondary | ICD-10-CM | POA: Diagnosis not present

## 2024-01-28 ENCOUNTER — Inpatient Hospital Stay

## 2024-01-28 ENCOUNTER — Inpatient Hospital Stay: Attending: Internal Medicine | Admitting: Hematology

## 2024-01-28 VITALS — BP 109/53 | HR 77 | Temp 97.2°F | Resp 18

## 2024-01-28 DIAGNOSIS — C50812 Malignant neoplasm of overlapping sites of left female breast: Secondary | ICD-10-CM

## 2024-01-28 DIAGNOSIS — D509 Iron deficiency anemia, unspecified: Secondary | ICD-10-CM

## 2024-01-28 DIAGNOSIS — Z170 Estrogen receptor positive status [ER+]: Secondary | ICD-10-CM | POA: Diagnosis not present

## 2024-01-28 DIAGNOSIS — N183 Chronic kidney disease, stage 3 unspecified: Secondary | ICD-10-CM

## 2024-01-28 DIAGNOSIS — N184 Chronic kidney disease, stage 4 (severe): Secondary | ICD-10-CM | POA: Insufficient documentation

## 2024-01-28 DIAGNOSIS — D631 Anemia in chronic kidney disease: Secondary | ICD-10-CM | POA: Diagnosis not present

## 2024-01-28 DIAGNOSIS — E559 Vitamin D deficiency, unspecified: Secondary | ICD-10-CM | POA: Insufficient documentation

## 2024-01-28 LAB — CBC WITH DIFFERENTIAL (CANCER CENTER ONLY)
Abs Immature Granulocytes: 0.03 K/uL (ref 0.00–0.07)
Basophils Absolute: 0 K/uL (ref 0.0–0.1)
Basophils Relative: 0 %
Eosinophils Absolute: 0.4 K/uL (ref 0.0–0.5)
Eosinophils Relative: 4 %
HCT: 25.5 % — ABNORMAL LOW (ref 36.0–46.0)
Hemoglobin: 7.9 g/dL — ABNORMAL LOW (ref 12.0–15.0)
Immature Granulocytes: 0 %
Lymphocytes Relative: 20 %
Lymphs Abs: 2.1 K/uL (ref 0.7–4.0)
MCH: 28.4 pg (ref 26.0–34.0)
MCHC: 31 g/dL (ref 30.0–36.0)
MCV: 91.7 fL (ref 80.0–100.0)
Monocytes Absolute: 0.6 K/uL (ref 0.1–1.0)
Monocytes Relative: 6 %
Neutro Abs: 7.4 K/uL (ref 1.7–7.7)
Neutrophils Relative %: 70 %
Platelet Count: 276 K/uL (ref 150–400)
RBC: 2.78 MIL/uL — ABNORMAL LOW (ref 3.87–5.11)
RDW: 14.6 % (ref 11.5–15.5)
WBC Count: 10.4 K/uL (ref 4.0–10.5)
nRBC: 0 % (ref 0.0–0.2)

## 2024-01-28 LAB — CMP (CANCER CENTER ONLY)
ALT: 7 U/L (ref 0–44)
AST: 10 U/L — ABNORMAL LOW (ref 15–41)
Albumin: 3.5 g/dL (ref 3.5–5.0)
Alkaline Phosphatase: 91 U/L (ref 38–126)
Anion gap: 7 (ref 5–15)
BUN: 90 mg/dL — ABNORMAL HIGH (ref 8–23)
CO2: 24 mmol/L (ref 22–32)
Calcium: 8.7 mg/dL — ABNORMAL LOW (ref 8.9–10.3)
Chloride: 105 mmol/L (ref 98–111)
Creatinine: 3.36 mg/dL — ABNORMAL HIGH (ref 0.44–1.00)
GFR, Estimated: 13 mL/min — ABNORMAL LOW (ref >60)
Glucose, Bld: 158 mg/dL — ABNORMAL HIGH (ref 70–99)
Potassium: 4.7 mmol/L (ref 3.5–5.1)
Sodium: 136 mmol/L (ref 135–145)
Total Bilirubin: 0.3 mg/dL (ref 0.0–1.2)
Total Protein: 7.3 g/dL (ref 6.5–8.1)

## 2024-01-28 LAB — IRON AND IRON BINDING CAPACITY (CC-WL,HP ONLY)
Iron: 36 ug/dL (ref 28–170)
Saturation Ratios: 15 % (ref 10.4–31.8)
TIBC: 239 ug/dL — ABNORMAL LOW (ref 250–450)
UIBC: 203 ug/dL (ref 148–442)

## 2024-01-28 LAB — FERRITIN: Ferritin: 129 ng/mL (ref 11–307)

## 2024-01-28 MED ORDER — EPOETIN ALFA-EPBX 20000 UNIT/ML IJ SOLN: 60000.0000 [IU] | Freq: Once | INTRAMUSCULAR | Status: DC

## 2024-01-28 MED ORDER — EPOETIN ALFA-EPBX 40000 UNIT/ML IJ SOLN
40000.0000 [IU] | Freq: Once | INTRAMUSCULAR | Status: AC
  Administered 2024-01-28: 40000 [IU] via SUBCUTANEOUS
  Filled 2024-01-28: qty 1

## 2024-01-28 MED ORDER — EPOETIN ALFA-EPBX 20000 UNIT/ML IJ SOLN
20000.0000 [IU] | Freq: Once | INTRAMUSCULAR | Status: AC
  Administered 2024-01-28: 20000 [IU] via SUBCUTANEOUS
  Filled 2024-01-28: qty 1

## 2024-01-28 NOTE — Progress Notes (Signed)
 Verified retacrit  dose = 60,000 units today per Dr Onesimo

## 2024-01-29 ENCOUNTER — Encounter: Payer: Self-pay | Admitting: Hematology

## 2024-01-29 ENCOUNTER — Encounter (HOSPITAL_COMMUNITY): Payer: Self-pay

## 2024-01-29 ENCOUNTER — Telehealth: Payer: Self-pay

## 2024-01-29 ENCOUNTER — Other Ambulatory Visit (HOSPITAL_COMMUNITY): Payer: Self-pay

## 2024-01-29 NOTE — Telephone Encounter (Signed)
 Pharmacy Patient Advocate Encounter  Received notification from [employer] that Prior Authorization for Ubrelvy  has been APPROVED from 01/29/2024 to 01/28/2025   PA #/Case ID/Reference #: [id number]

## 2024-01-29 NOTE — Telephone Encounter (Signed)
 Pharmacy Patient Advocate Encounter   Received notification from CoverMyMeds that prior authorization for Ubrelvy  50mg  Tablet is required/requested.   Insurance verification completed.   The patient is insured through [facility] [employer] .   Per test claim: PA required; PA submitted to above mentioned insurance via Latent Key/confirmation #/EOC [id number] Status is pending

## 2024-01-30 ENCOUNTER — Ambulatory Visit: Admitting: Family Medicine

## 2024-01-30 DIAGNOSIS — N2581 Secondary hyperparathyroidism of renal origin: Secondary | ICD-10-CM | POA: Diagnosis not present

## 2024-01-30 DIAGNOSIS — N184 Chronic kidney disease, stage 4 (severe): Secondary | ICD-10-CM | POA: Diagnosis not present

## 2024-01-30 DIAGNOSIS — I509 Heart failure, unspecified: Secondary | ICD-10-CM | POA: Diagnosis not present

## 2024-01-30 DIAGNOSIS — I129 Hypertensive chronic kidney disease with stage 1 through stage 4 chronic kidney disease, or unspecified chronic kidney disease: Secondary | ICD-10-CM | POA: Diagnosis not present

## 2024-01-30 DIAGNOSIS — D631 Anemia in chronic kidney disease: Secondary | ICD-10-CM | POA: Diagnosis not present

## 2024-01-30 DIAGNOSIS — E875 Hyperkalemia: Secondary | ICD-10-CM | POA: Diagnosis not present

## 2024-01-30 DIAGNOSIS — E1122 Type 2 diabetes mellitus with diabetic chronic kidney disease: Secondary | ICD-10-CM | POA: Diagnosis not present

## 2024-02-04 ENCOUNTER — Encounter (HOSPITAL_COMMUNITY): Payer: Self-pay

## 2024-02-04 ENCOUNTER — Encounter: Payer: Self-pay | Admitting: Hematology

## 2024-02-04 NOTE — Progress Notes (Signed)
 Hematology/Oncology Clinic Followup Note  Date of service: .01/28/2024    Patient Care Team: Sebastian Beverley NOVAK, MD as PCP - General (Family Medicine) Mona Vinie BROCKS, MD as PCP - Cardiology (Cardiology) Edith, Debby BROCKS, MD as Consulting Physician (Cardiology) Octavia Charleston, MD as Consulting Physician (Ophthalmology) Vernetta Berg, MD as Consulting Physician (General Surgery) Okey Arch, MD (Inactive) as Referring Physician (Obstetrics and Gynecology) Elner Arley LABOR, MD as Consulting Physician (Ophthalmology) [name], Donell Cardinal, MD as Consulting Physician (Endocrinology) Onesimo Emaline Brink, MD as Consulting Physician (Hematology) Skeet Juliene SAUNDERS, DO as Consulting Physician (Neurology) Hobart Powell BRAVO, MD (Inactive) as Consulting Physician (Cardiology) Wallace, Juana M, RN as VBCI Care Management  CHIEF COMPLAINTS/PURPOSE OF CONSULTATION:  Follow-up for continued evaluation and management of breast cancer  DIAGNOSIS: left-sided presumed stage IA  (pT1c,pNx[cN0], cM0) invasive ductal carcinoma grade 2 out of 3, strongly ER +100%, strongly PR +100% and HER-2/neu negative. Given her high risk cardiac status lumpectomy had to be done under local anesthesia and sentinel lymph node biopsy was not possible. She has accompanying DCIS of intermediate grade present at margins. ECOG performance status is 3. -Had a mammogram/tomosynthesis done on 07/29/2015 which shows residual suspicious calcification in the left breast postero-medial to the biopsy site. Has known residual DCIS.  No evidence of new malignancy.  No evidence of right breast malignancy.   CURRENT TREATMENT: Arimidex  1mg  po daily  HISTORY OF PRESENTING ILLNESS: -please see my initial consultation for details of initial presentation   INTERVAL HISTORY:  [name] is a 89 y.o. female is here for continued evaluation and management of her breast cancer and anemia due to chronic kidney disease. Patient notes  no new breast symptoms.  Has been off her Arimidex  after completing more than 5 years of treatment. Continues to have significant fatigue with shortness of breath and intermittent dizziness. Labs done today were reviewed with her in details. She is accompanied by her daughter for this clinic visit.  MEDICAL HISTORY:  Past Medical History:  Diagnosis Date   Abdominal pain 01/26/2012   Acute deep vein thrombosis (DVT) of femoral vein of right lower extremity (HCC) 03/26/2022   Acute kidney injury superimposed on chronic kidney disease 12/18/2014   Acute upper GI bleed 03/26/2022   Allergy     takes Mucinex  daily as needed   Anemia, unspecified    Anxiety    takes Clonazepam  daily as needed   Anxiety associated with depression 06/08/2017   Arthritis    Breast cancer (HCC)    Breast cancer screening 02/19/2014   Breast mass 10/27/2014   Chronic diastolic CHF (congestive heart failure) (HCC)    takes Furosemide  daily   Chronic pain syndrome 02/06/2015   CKD (chronic kidney disease), stage III (HCC)    Coronary artery disease    a. s/p IMI 2004 tx with BMS to RCA;  b. s/p Promus DES to RCA 2/12 c. abnormal nuc 2016 -> cath with med rx. d. NSTEMI 02/2020  mv CAD with severe ISR in pRCA and m/dRCA lesion both treated with DES.   Debility 08/01/2012   Dementia (HCC)    Depression    takes Cymbalta  daily   Diabetes mellitus    insulin  daily   DOE (dyspnea on exertion) 06/24/2014   Dysphagia, unspecified(787.20) 07/31/2012   Fungal dermatitis 11/13/2007   Qualifier: Diagnosis of  By: Jame  MD, Maude FALCON    GERD (gastroesophageal reflux disease)    takes Protonix  daily   Gout, unspecified  Headache    occasionally   History of anemia due to chronic kidney disease 03/26/2022   History of blood clots    History of deep venous thrombosis 01/27/2012   History of pulmonary embolus (PE) 04/22/2014   Hyperkalemia 07/26/2017   Hyperlipidemia    takes Pravastatin  daily    Hypertension    Insomnia    Joint pain    Joint swelling    Morbid obesity (HCC)    Multiple benign nevi 11/15/2016   Myocardial infarction [facility]) 2004   Non-STEMI (non-ST elevated myocardial infarction) (HCC) 02/15/2020   Nonintractable headache 01/07/2008   Qualifier: Diagnosis of   By: Jame  MD, Maude FALCON        Numbness    Obstructive sleep apnea    does not wear cpap   Osteoarthritis    Osteoarthritis    Osteoarthrosis, unspecified whether generalized or localized, lower leg    Pain in the chest 12/31/2013   Pain, chronic    Personal history of other diseases of the digestive system 12/03/2006   Qualifier: Diagnosis of  By: Jame  MD, Maude FALCON    Polymyalgia rheumatica    Presence of stent in right coronary artery    Pulmonary emboli (HCC) 01/2012   felt to need lifelong anticoagulation but Xarelto  dc in 02/2020 due to need for DAPT   Situational depression 06/04/2009   Qualifier: Diagnosis of  By: Jame  MD, Maude FALCON    Syncope, vasovagal 07/04/2021   Type 2 diabetes mellitus with hyperlipidemia (HCC) 02/16/2022   Urinary incontinence    takes Linzess  daily   Vaginitis and vulvovaginitis 06/23/2016   Vertigo    hx of;was taking Meclizine  if needed   Wears dentures     SURGICAL HISTORY: Past Surgical History:  Procedure Laterality Date   ABDOMINAL HYSTERECTOMY     partial   APPENDECTOMY     blood clots/legs and lungs  2013   BREAST BIOPSY Left 07/22/2014   BREAST BIOPSY Left 02/10/2013   BREAST LUMPECTOMY Left 11/05/2014   BREAST LUMPECTOMY WITH RADIOACTIVE SEED LOCALIZATION Left 11/05/2014   Procedure: LEFT BREAST LUMPECTOMY WITH RADIOACTIVE SEED LOCALIZATION;  Surgeon: Vicenta Poli, MD;  Location: [facility] OR;  Service: General;  Laterality: Left;   CARDIAC CATHETERIZATION     COLONOSCOPY     CORONARY ANGIOPLASTY  2   CORONARY STENT INTERVENTION N/A 02/17/2020   Procedure: CORONARY STENT INTERVENTION;  Surgeon: Anner Alm ORN, MD;  Location: [facility]  INVASIVE CV LAB;  Service: Cardiovascular;  Laterality: N/A;   ESOPHAGOGASTRODUODENOSCOPY (EGD) WITH PROPOFOL  N/A 11/07/2016   Procedure: ESOPHAGOGASTRODUODENOSCOPY (EGD) WITH PROPOFOL ;  Surgeon: Avram Lupita BRAVO, MD;  Location: [facility] ENDOSCOPY;  Service: Endoscopy;  Laterality: N/A;   ESOPHAGOGASTRODUODENOSCOPY (EGD) WITH PROPOFOL  N/A 01/05/2023   Procedure: ESOPHAGOGASTRODUODENOSCOPY (EGD) WITH PROPOFOL ;  Surgeon: Abran Norleen SAILOR, MD;  Location: [facility] ENDOSCOPY;  Service: Gastroenterology;  Laterality: N/A;   EXCISION OF SKIN TAG Right 11/05/2014   Procedure: EXCISION OF RIGHT EYELID SKIN TAG;  Surgeon: Vicenta Poli, MD;  Location: [facility] OR;  Service: General;  Laterality: Right;   EYE SURGERY Bilateral    cataract    GASTRIC BYPASS  1977    reversed in 1979, [facility]   LEFT HEART CATH AND CORONARY ANGIOGRAPHY N/A 02/17/2020   Procedure: LEFT HEART CATH AND CORONARY ANGIOGRAPHY;  Surgeon: Anner Alm ORN, MD;  Location: [facility] INVASIVE CV LAB;  Service: Cardiovascular;  Laterality: N/A;   LEFT HEART CATHETERIZATION WITH CORONARY ANGIOGRAM N/A 06/29/2014  Procedure: LEFT HEART CATHETERIZATION WITH CORONARY ANGIOGRAM;  Surgeon: Debby DELENA Sor, MD;  Location: [facility] CATH LAB;  Service: Cardiovascular;  Laterality: N/A;   MEMBRANE PEEL Right 10/23/2018   Procedure: MEMBRANE PEEL;  Surgeon: Elner Arley DELENA, MD;  Location: [facility] OR;  Service: Ophthalmology;  Laterality: Right;   MI with stent placement  2004   PARS PLANA VITRECTOMY Right 10/23/2018   Procedure: PARS PLANA VITRECTOMY WITH 25 GAUGE;  Surgeon: Elner Arley DELENA, MD;  Location: [facility] OR;  Service: Ophthalmology;  Laterality: Right;    SOCIAL HISTORY: Social History   Socioeconomic History   Marital status: Widowed    Spouse name: Not on file   Number of children: 3   Years of education: Not on file   Highest education level: High school graduate  Occupational History   Occupation: retired  Tobacco Use   Smoking status: Never    Passive exposure:  Past   Smokeless tobacco: Never   Tobacco comments:    Both parents smoked, patient was exposed/ Raised up in smoke, my whole life.  Vaping Use   Vaping status: Never Used  Substance and Sexual Activity   Alcohol use: No    Alcohol/week: 0.0 standard drinks of alcohol   Drug use: No   Sexual activity: Not Currently    Birth control/protection: None  Other Topics Concern   Not on file  Social History Narrative   Not on file   Social Drivers of Health   Financial Resource Strain: Low Risk  (09/28/2023)   Overall Financial Resource Strain (CARDIA)    Difficulty of Paying Living Expenses: Not hard at all  Food Insecurity: No Food Insecurity (10/05/2023)   Hunger Vital Sign    Worried About Running Out of Food in the Last Year: Never true    Ran Out of Food in the Last Year: Never true  Transportation Needs: No Transportation Needs (12/27/2023)   Received from [facility]   PRAPARE - Transportation    Lack of Transportation (Medical): No    Lack of Transportation (Non-Medical): No  Physical Activity: Inactive (09/28/2023)   Exercise Vital Sign    Days of Exercise per Week: 0 days    Minutes of Exercise per Session: 0 min  Stress: Stress Concern Present (09/28/2023)   [employer] of Occupational Health - Occupational Stress Questionnaire    Feeling of Stress : To some extent  Social Connections: Socially Isolated (10/05/2023)   Social Connection and Isolation Panel    Frequency of Communication with Friends and Family: Three times a week    Frequency of Social Gatherings with Friends and Family: More than three times a week    Attends Religious Services: Never    Database administrator or Organizations: No    Attends Banker Meetings: Never    Marital Status: Widowed  Intimate Partner Violence: Not At Risk (12/27/2023)   Received from [facility]   Humiliation, Afraid, Rape, and Kick questionnaire    Within the last year, have you  been afraid of your partner or ex-partner?: No    Within the last year, have you been humiliated or emotionally abused in other ways by your partner or ex-partner?: No    Within the last year, have you been kicked, hit, slapped, or otherwise physically hurt by your partner or ex-partner?: No    Within the last year, have you been raped or forced to have any kind of sexual activity by your partner or  ex-partner?: No    FAMILY HISTORY: Family History  Problem Relation Age of Onset   Breast cancer Mother 35   Heart disease Mother    Throat cancer Father    Hypertension Father    Arthritis Father    Diabetes Father    Arthritis Sister    Obesity Sister    Diabetes Sister    Heart disease Cousin    Colon cancer Neg Hx    Stomach cancer Neg Hx    Esophageal cancer Neg Hx     ALLERGIES:  is allergic to sulfonamide derivatives, duloxetine , lokelma  [sodium zirconium cyclosilicate ], sulfamethoxazole-trimethoprim, aricept  [donepezil  hcl], and tramadol .  MEDICATIONS:  Current Outpatient Medications  Medication Sig Dispense Refill   albuterol  (PROAIR  HFA) 108 (90 Base) MCG/ACT inhaler Inhale 1-2 puffs into the lungs every 6 (six) hours as needed for wheezing or shortness of breath. 6.7 g 1   amitriptyline  (ELAVIL ) 75 MG tablet Take 1 tablet (75 mg total) by mouth at bedtime. 90 tablet 0   atorvastatin  (LIPITOR ) 80 MG tablet TAKE 1 TABLET BY MOUTH EVERY DAY 90 tablet 3   azelastine  (ASTELIN ) 0.1 % nasal spray Place 2 sprays into both nostrils 2 (two) times daily. 30 mL 12   calcitRIOL  (ROCALTROL ) 0.25 MCG capsule Take 0.25 mcg by mouth daily.     carvedilol  (COREG ) 12.5 MG tablet TAKE 1 TABLET BY MOUTH 2 TIMES DAILY 180 tablet 1   cephALEXin  (KEFLEX ) 500 MG capsule Take 500 mg by mouth 2 (two) times daily.     cetirizine  (ZYRTEC ) 10 MG tablet Take 10 mg by mouth daily as needed for allergies or rhinitis.     Continuous Glucose Receiver (FREESTYLE LIBRE 3 READER) DEVI Continuous glucose monitor  1 each 0   Continuous Glucose Sensor (FREESTYLE LIBRE 3 PLUS SENSOR) MISC 1 Device by Other route every 14 (fourteen) days. Change sensor every 15 days. 6 each 3   diclofenac  Sodium (VOLTAREN ) 1 % GEL Apply 2 g topically 4 (four) times daily. 150 g 2   doxycycline  (VIBRA -TABS) 100 MG tablet Take 100 mg by mouth 2 (two) times daily. for 10 days     ELIQUIS  5 MG TABS tablet TAKE 1 TABLET BY MOUTH 2 TIMES DAILY 180 tablet 3   febuxostat  (ULORIC ) 40 MG tablet TAKE 1 TABLET BY MOUTH EVERY DAY 90 tablet 3   fluticasone  (FLONASE ) 50 MCG/ACT nasal spray Place 2 sprays into both nostrils daily. 16 g 6   folic acid  (FOLVITE ) 1 MG tablet TAKE 1 TABLET BY MOUTH EVERY DAY 90 tablet 1   gabapentin  (NEURONTIN ) 100 MG capsule Take 1 capsule (100 mg total) by mouth at bedtime. 90 capsule 3   glucagon  1 MG injection Inject 1 mg into the vein once as needed for up to 1 dose. 1 each 12   glucose 4 GM chewable tablet Chew 1 tablet (4 g total) by mouth as needed for low blood sugar. 50 tablet 12   hydrOXYzine  (ATARAX ) 25 MG tablet TAKE 1 TABLET BY MOUTH EVERY 8 HOURS AS NEEDED FOR ANXIETY OR ITCHING (NASAL AND THROAT CONGESTION) 120 tablet 0   insulin  glargine, 2 Unit Dial , (TOUJEO  MAX SOLOSTAR) 300 UNIT/ML Solostar Pen Inject 40 units subcutaneously one daily 15 mL 3   ipratropium-albuterol  (DUONEB) 0.5-2.5 (3) MG/3ML SOLN Take 3 mLs by nebulization every 6 (six) hours as needed (cough, wheezing, or shortness of breath). 360 mL 0   linaclotide  (LINZESS ) 145 MCG CAPS capsule TAKE 1 CAPSULE BY MOUTH ONCE DAILY  BEFORE BREAKFAST     meclizine  (ANTIVERT ) 25 MG tablet Take 1 tablet (25 mg total) by mouth 2 (two) times daily. 180 tablet 1   melatonin 5 MG TABS Take 1 tablet (5 mg total) by mouth at bedtime as needed. 15 tablet 0   memantine  (NAMENDA ) 5 MG tablet TAKE 2 TABLETS BY MOUTH EVERY MORNING and TAKE 1 TABLET EVERY EVENING 270 tablet 5   montelukast  (SINGULAIR ) 10 MG tablet Take 1 tablet (10 mg total) by mouth at  bedtime. 30 tablet 3   mupirocin ointment (BACTROBAN) 2 % Apply 1 Application topically.     nitroGLYCERIN  (NITROSTAT ) 0.4 MG SL tablet Dissolve 1 tablet under the tongue every 5 minutes as needed for chest pain. Max of 3 doses, then 911. 25 tablet 6   NOVOLOG  FLEXPEN 100 UNIT/ML FlexPen INJECT 14 UNITS SUBCUTANEOUSLY TWICE DAILY WITH A MEAL . DO NOT EXCEED 70 PER 24 HOURS 15 mL 0   nystatin  (MYCOSTATIN /NYSTOP ) powder Apply 1 Application topically 3 (three) times daily. 60 g 3   ondansetron  (ZOFRAN ) 4 MG tablet TAKE 1 TABLET BY MOUTH EVERY 8 HOURS AS NEEDED FOR NAUSEA FOR VOMITING     ondansetron  (ZOFRAN -ODT) 4 MG disintegrating tablet Take 1 tablet (4 mg total) by mouth every 8 (eight) hours as needed for nausea or vomiting. 20 tablet 0   oxyCODONE -acetaminophen  (PERCOCET) 7.5-325 MG tablet Take 1 tablet by mouth every 12 (twelve) hours.     pantoprazole  (PROTONIX ) 20 MG tablet TAKE 1 TABLET BY MOUTH ONCE DAILY 30 tablet 5   ranolazine  (RANEXA ) 500 MG 12 hr tablet Take 1 tablet (500 mg total) by mouth 2 (two) times daily. 60 tablet 1   Semaglutide  (RYBELSUS ) 7 MG TABS Take 1 tablet (7 mg total) by mouth daily. 30 tablet 1   sodium zirconium cyclosilicate  (LOKELMA ) 10 g PACK packet USE 1 PACKET BY MOUTH AS DIRECTED DAILY     torsemide  (DEMADEX ) 20 MG tablet TAKE 1 TABLET BY MOUTH EVERY DAY MAY TAKE AN EXTRA DOES FOR WEIGHT GAIN OF 3LBs IN 1 DAY OR 5LBS IN 1 WEEK OR FOR WORSENING SWELLING 30 tablet 1   traMADol  (ULTRAM ) 50 MG tablet TAKE 1 TABLET BY MOUTH EVERY 12 HOURS AS NEEDED TAKE THE FIRST 4 DOSES WITH ZOFRAN  TO MINIMIZE NAUSEA     TYLENOL  500 MG tablet Take 1,000 mg by mouth every 6 (six) hours as needed for mild pain or headache.     TYLENOL  PM EXTRA STRENGTH 500-25 MG TABS tablet Take 2 tablets by mouth at bedtime.     Ubrogepant  (UBRELVY ) 50 MG TABS Take 1 tab at onset of migraine.  May repeat in 2 hrs, if needed.  Max dose: 2 tabs/day. This is a 30 day prescription. 12 tablet 6   Vitamin D ,  Ergocalciferol , (DRISDOL ) 1.25 MG (50000 UNIT) CAPS capsule TAKE 1 CAPSULE BY MOUTH ONCE WEEKLY ON MONDAY 4 capsule 1   No current facility-administered medications for this visit.    REVIEW OF SYSTEMS:   10 Point review of Systems was done is negative except as noted above.  PHYSICAL EXAMINATION:  ECOG PERFORMANCE STATUS:3  Vitals:   01/28/24 1315  BP: (!) 109/53  Pulse: 77  Resp: 18  Temp: (!) 97.2 F (36.2 C)  SpO2: 100%  NAD GENERAL:alert, in no acute distress and comfortable SKIN: no acute rashes, no significant lesions EYES: Significant conjunctival pallor, sclera anicteric OROPHARYNX: MMM, no exudates, no oropharyngeal erythema or ulceration NECK: supple, no JVD LYMPH:  no palpable lymphadenopathy in the cervical, axillary or inguinal regions LUNGS: clear to auscultation b/l with normal respiratory effort HEART: regular rate & rhythm ABDOMEN:  normoactive bowel sounds , non tender, not distended. Extremity: no pedal edema PSYCH: alert & oriented x 3 with fluent speech NEURO: no focal motor/sensory deficits   LABORATORY DATA:  I have reviewed the data as listed  .    Latest Ref Rng & Units 01/28/2024   12:55 PM 01/16/2024    2:21 PM 11/28/2023    1:34 PM  CBC  WBC 4.0 - 10.5 K/uL 10.4  8.9  16.2   Hemoglobin 12.0 - 15.0 g/dL 7.9  8.5 aL  7.4   Hematocrit 36.0 - 46.0 % 25.5  26.7 aL  24.7   Platelets 150 - 400 K/uL 276  239.0  262    . CBC    Component Value Date/Time   WBC 10.4 01/28/2024 1255   WBC 8.9 01/16/2024 1421   RBC 2.78 (L) 01/28/2024 1255   HGB 7.9 (L) 01/28/2024 1255   HGB 9.3 (L) 03/02/2020 1517   HGB 8.7 (L) 03/21/2017 1351   HCT 25.5 (L) 01/28/2024 1255   HCT 29.5 (L) 03/02/2020 1517   HCT 28.9 (L) 03/21/2017 1351   PLT 276 01/28/2024 1255   PLT 254 03/02/2020 1517   MCV 91.7 01/28/2024 1255   MCV 88 03/02/2020 1517   MCV 84.3 03/21/2017 1351   MCH 28.4 01/28/2024 1255   MCHC 31.0 01/28/2024 1255   RDW 14.6 01/28/2024 1255   RDW  13.1 03/02/2020 1517   RDW 15.5 (H) 03/21/2017 1351   LYMPHSABS 2.1 01/28/2024 1255   LYMPHSABS 2.4 03/21/2017 1351   MONOABS 0.6 01/28/2024 1255   MONOABS 0.7 03/21/2017 1351   EOSABS 0.4 01/28/2024 1255   EOSABS 0.2 03/21/2017 1351   BASOSABS 0.0 01/28/2024 1255   BASOSABS 0.0 03/21/2017 1351   .    Latest Ref Rng & Units 01/28/2024   12:55 PM 01/16/2024    2:21 PM 11/28/2023    1:34 PM  CMP  Glucose 70 - 99 mg/dL 841  863  878   BUN 8 - 23 mg/dL 90  76  73   Creatinine 0.44 - 1.00 mg/dL 6.63  7.05  6.90   Sodium 135 - 145 mmol/L 136  137  133   Potassium 3.5 - 5.1 mmol/L 4.7  4.5  4.3   Chloride 98 - 111 mmol/L 105  105  100   CO2 22 - 32 mmol/L 24  22  21    Calcium  8.9 - 10.3 mg/dL 8.7  8.7  7.5   Total Protein 6.5 - 8.1 g/dL 7.3   6.1   Total Bilirubin 0.0 - 1.2 mg/dL 0.3   0.4   Alkaline Phos 38 - 126 U/L 91   68   AST 15 - 41 U/L 10   17   ALT 0 - 44 U/L 7   9     . Lab Results  Component Value Date   IRON  36 01/28/2024   TIBC 239 (L) 01/28/2024   IRONPCTSAT 15 01/28/2024   (Iron  and TIBC)  Lab Results  Component Value Date   FERRITIN 129 01/28/2024       RADIOGRAPHIC STUDIES: I have personally reviewed the radiological images as listed and agreed with the findings in the report.  DG Toe Great Right Result Date: 01/16/2024 CLINICAL DATA:  Cut, great toe pain EXAM: RIGHT GREAT TOE COMPARISON:  None Available. FINDINGS:  Osteopenia.No acute fracture or dislocation. There is no evidence of arthropathy or other focal bone abnormality. No subcutaneous gas. No radiopaque foreign body. IMPRESSION: No acute fracture or dislocation. No radiographic findings of osteomyelitis, at this time. Electronically Signed   By: Rogelia Myers M.D.   On: 01/16/2024 17:41   Diagnostic mammogram b/l 11/01/17  IMPRESSION: No significant change in suspicious microcalcifications posterior to the lumpectomy site in the left breast compared to the mammogram of 2018. No suspicious  findings in the right breast. Exclusion of some posterior tissue due to patient decreased mobility.   RECOMMENDATION: Diagnostic mammogram is suggested in 1 year. (Code:[id number])  CLINICAL DATA:  Annual mammography. The patient had surgery for DCIS in the left breast in 2016. There were positive margins at surgery but the patient could not return for additional surgery. Known suspicious calcifications remain. EXAM: 2D DIGITAL DIAGNOSTIC BILATERAL MAMMOGRAM WITH CAD AND ADJUNCT TOMO COMPARISON:  Previous exam(s). ACR Breast Density Category b: There are scattered areas of fibroglandular density. FINDINGS: The suspicious calcifications posterior to the left lumpectomy site are stable. No other interval changes or other suspicious findings. Mammographic images were processed with CAD. IMPRESSION: Continued suspicious calcifications posterior to the left lumpectomy site. No other changes. RECOMMENDATION: Continued annual mammography for surveillance. Continued oncologic follow up. I have discussed the findings and recommendations with the patient. Results were also provided in writing at the conclusion of the visit. If applicable, a reminder letter will be sent to the patient regarding the next appointment. BI-RADS CATEGORY  6: Known biopsy-proven malignancy.     Electronically Signed   By: Alm Pouch III M.D   On: 08/16/2016 15:21    ASSESSMENT & PLAN:   Mrs. Chandran is a very wonderful 89 y.o. African-American female with multiple medical comorbidities as described above with   #1 Left-sided presumed stage IA  (pT1c,pNx[cN0], cM0) invasive ductal carcinoma grade 2 out of 3, strongly ER +100%, strongly PR +100% and HER-2/neu negative. Given her high risk cardiac status lumpectomy had to be done under local anesthesia and sentinel lymph node biopsy was not possible. She has accompanying DCIS of intermediate grade present at margins. ECOG performance status is 3. Dexa scan  low bone mass per WHO. -Bone density scan on 03/12/2017 with results showing: T-score of 0.5 at left forearm. Lumbar spine and dual femurs not completed due to patient in a wheelchair and not able to lay flat due to SOB while laying flat.   #2 significant coronary artery disease with stress test in February 2016 suggesting likely multivessel disease. Has uncontrolled diabetes, hypertension, dyslipidemia, untreated sleep apnea, coronary disease, severe arthritis all of which are significantly limiting her quality of life.  #3 vitamin D  deficiency - 25OH vitamin D  level of 10, s/p high dose ergocalciferol  re-placement with 25OH Vit D improvement to 30.9 in 10/2017. Now on vit D 2000 units daily.   #4  Anemia- anemia of chronic disease from CKD + ? Slow GI losses . Multiple metabolic insults in the bone marrow including uncontrolled diabetes  Now with more reapid drop due to concerns for active GI melena and hematuria.  PLAN:  Discussed lab results in detail with the patient and her daughter at bedside CBC shows patient remains anemic at 7.9 with normal WBC count of 10.4k and normal platelets of 276k CMP shows worsening renal function with a creatinine of 3.36 Ferritin of 129 with an iron  saturation of 15% -transfuse prn for hgb<7 or if symptomatic Will need additional IV  iron  Venofer  300 mg mg weekly x 3 doses Will increase the Retacrit  to 60,000 units every 2 weeks We discussed that her worsening kidney function is contributing to her persistent anemia that is not responsive to iron  and erythropoietin  due to possible uremia. - She has a follow-up with her nephrologist next week and was encouraged to have an honest discussion with the nephrologist regarding whether she is a candidate for dialysis or might need to go on palliative care/hospice on this account.  FOLLOW-UP: IV Venofer  3 mg weekly x 3 doses Continue Retacrit  every 2 weeks with labs MD visit in 2 months with labs   The total  time spent in the appointment was 30 minutes*.  All of the patient's questions were answered with apparent satisfaction. The patient knows to call the clinic with any problems, questions or concerns.   Emaline Saran MD MS AAHIVMS [facility] Hematology/Oncology Physician [facility]  .*Total Encounter Time as defined by the Centers for Medicare and Medicaid Services includes, in addition to the face-to-face time of a patient visit (documented in the note above) non-face-to-face time: obtaining and reviewing outside history, ordering and reviewing medications, tests or procedures, care coordination (communications with other health care professionals or caregivers) and documentation in the medical record.

## 2024-02-05 ENCOUNTER — Other Ambulatory Visit: Payer: Self-pay | Admitting: Family Medicine

## 2024-02-05 ENCOUNTER — Other Ambulatory Visit: Payer: Self-pay

## 2024-02-05 DIAGNOSIS — I5032 Chronic diastolic (congestive) heart failure: Secondary | ICD-10-CM

## 2024-02-05 NOTE — Progress Notes (Unsigned)
   02/05/2024  Patient ID: [name], female   DOB: December 01, 1934, 89 y.o.   MRN: [id number]  Outreach attempt to contact patient's daughter, Sharlet (HA[place]II), to follow-up on management of diabetes.  I was not able to reach the patient but did leave a HIPAA compliant voicemail with my direct phone number.  [name], PharmD, DPLA

## 2024-02-07 ENCOUNTER — Other Ambulatory Visit: Payer: Self-pay | Admitting: Family Medicine

## 2024-02-07 DIAGNOSIS — I5032 Chronic diastolic (congestive) heart failure: Secondary | ICD-10-CM

## 2024-02-07 DIAGNOSIS — I2699 Other pulmonary embolism without acute cor pulmonale: Secondary | ICD-10-CM

## 2024-02-11 ENCOUNTER — Telehealth: Payer: Self-pay | Admitting: Family Medicine

## 2024-02-11 NOTE — Telephone Encounter (Signed)
 Order was re faxed with DX codes added on 03-03-24 with confirmation

## 2024-02-11 NOTE — Telephone Encounter (Signed)
 Copied from CRM #[id number]. Topic: General - Call Back - No Documentation >> Feb 11, 2024 10:50 AM Rea BROCKS wrote: Reason for CRM: Maggy from [facility] stated that she's going to refax the order for request of diabetic shoe for patient because it is missing the  Diagnosis code on the top portion and Qualifying conditions on order.

## 2024-02-12 ENCOUNTER — Inpatient Hospital Stay: Admitting: Family Medicine

## 2024-02-15 ENCOUNTER — Telehealth: Payer: Self-pay

## 2024-02-15 NOTE — Telephone Encounter (Signed)
 Copied from CRM [id number]. Topic: General - Other >> Feb 15, 2024  1:33 PM Sasha M wrote: Reason for CRM: pt got a new car and is inquiring the doctor needs to fill out new form for handicap tag. She says her daughter can pick up form if provider can complete this form. Please call pt and advise accordingly at [phone]

## 2024-02-15 NOTE — Telephone Encounter (Signed)
 Forwarding message below. Please advise on request

## 2024-02-18 NOTE — Telephone Encounter (Signed)
 Patient dropped off document Handicap Placard, to be filled out by provider. Patient requested to send it back via Call Patient to pick up within ASAP. Document is located in providers tray at front office.Please advise at [phone]

## 2024-02-19 DIAGNOSIS — Z0279 Encounter for issue of other medical certificate: Secondary | ICD-10-CM

## 2024-02-19 NOTE — Telephone Encounter (Signed)
Signed orders faxed to [facility].

## 2024-02-19 NOTE — Telephone Encounter (Signed)
 Spoke to Pt daughter Joesphine) and informed her that Placard is ready for pick up. Pt daughter verbalized understanding; hard copy was placed at front desk and charge sheet in [employer] office.

## 2024-02-20 ENCOUNTER — Ambulatory Visit (INDEPENDENT_AMBULATORY_CARE_PROVIDER_SITE_OTHER): Admitting: Family Medicine

## 2024-02-20 ENCOUNTER — Encounter: Payer: Self-pay | Admitting: Family Medicine

## 2024-02-20 VITALS — BP 133/64 | HR 80 | Temp 97.1°F | Resp 18

## 2024-02-20 DIAGNOSIS — G4709 Other insomnia: Secondary | ICD-10-CM

## 2024-02-20 DIAGNOSIS — K582 Mixed irritable bowel syndrome: Secondary | ICD-10-CM

## 2024-02-20 DIAGNOSIS — F411 Generalized anxiety disorder: Secondary | ICD-10-CM

## 2024-02-20 DIAGNOSIS — H66004 Acute suppurative otitis media without spontaneous rupture of ear drum, recurrent, right ear: Secondary | ICD-10-CM

## 2024-02-20 DIAGNOSIS — Z794 Long term (current) use of insulin: Secondary | ICD-10-CM

## 2024-02-20 DIAGNOSIS — E1140 Type 2 diabetes mellitus with diabetic neuropathy, unspecified: Secondary | ICD-10-CM

## 2024-02-20 MED ORDER — AMITRIPTYLINE HCL 75 MG PO TABS
75.0000 mg | ORAL_TABLET | Freq: Every day | ORAL | 3 refills | Status: DC
Start: 2024-02-20 — End: 2024-03-31

## 2024-02-20 MED ORDER — GABAPENTIN 100 MG PO CAPS: 100.0000 mg | ORAL_CAPSULE | Freq: Every day | ORAL | 3 refills | Status: DC

## 2024-02-20 MED ORDER — LINACLOTIDE 72 MCG PO CAPS: 72.0000 ug | ORAL_CAPSULE | Freq: Every day | ORAL | 1 refills | Status: DC

## 2024-02-20 MED ORDER — AMOXICILLIN-POT CLAVULANATE 875-125 MG PO TABS: 1.0000 | ORAL_TABLET | Freq: Two times a day (BID) | ORAL | 0 refills | Status: DC

## 2024-02-20 NOTE — Patient Instructions (Signed)
  VISIT SUMMARY: Today, we addressed your chronic foot wound, migraine headaches, right ear symptoms, peripheral neuropathy, and chronic constipation. We made some adjustments to your medications and provided referrals for further evaluation.  YOUR PLAN: MIGRAINE HEADACHES: You have been experiencing persistent migraine headaches. You recently took Ubrelvy , which may have helped. -Continue taking Ubrelvy  as prescribed by your neurologist. -Follow up with your neurologist to discuss potential preventive treatment options.  RIGHT EAR INFECTION (OTITIS MEDIA): You have symptoms of a right ear infection, including ear fullness and muffled hearing. -You will be referred to an ENT specialist for further evaluation. -Start a 10-day course of Augmentin  as prescribed.  CHRONIC RIGHT GREAT TOE WOUND: Your chronic wound on the right great toe is showing significant improvement. -Continue using Epsom salts and Neosporin as needed.  PERIPHERAL NEUROPATHY: You have numbness and burning in your right foot and leg. -Refill your amitriptyline  75 mg to take at night. -Refill your gabapentin  100 mg to take at night.  CHRONIC CONSTIPATION: You have chronic constipation and have had issues with urgency when using Linzess . -Start taking Linzess  72 mcg daily before a big meal. -Monitor your bowel movements and adjust treatment as needed.

## 2024-02-20 NOTE — Progress Notes (Signed)
 Assessment & Plan   Assessment/Plan:    Assessment & Plan Migraine Migraine headaches are persistent. Recently prescribed Ubrelvy  by neurology, which was taken last night. She reports severe headache pain, but the medication may have helped as she was able to sleep. Discussed the potential for preventive treatment options in the same medication family. - Continue Ubrelvy  as prescribed by neurology - Follow up with neurology for potential preventive treatment options  Recurrent acute right otitis media without tympanic membrane rupture Right ear infection suspected with symptoms of ear fullness and muffled hearing.  TM is injected.  Previous antibiotic treatments have provided temporary relief. Steroids are not an option due to blood sugar concerns. - Refer to ENT specialist - Prescribe a 10-day course of Augmentin   Chronic right great toe wound (improving) The chronic wound under the right great toe is showing significant improvement. The wound is almost healed with minimal crowding noted, possibly due to tight footwear. - Use emollient as needed  Peripheral neuropathy Peripheral neuropathy with symptoms of numbness and burning in the right foot and leg. Current medications include amitriptyline  and gabapentin . - Refill amitriptyline  75 mg at night - Refill gabapentin  100 mg daily at night  IBS with diarrhea and constipation Chronic constipation persists. Previous use of Linzess  caused issues with urgency. Discussed trying a lower dose of Linzess  to manage symptoms. - Prescribe Linzess  72 mcg daily before a big meal - Monitor bowel movements and adjust treatment as needed      Medications Discontinued During This Encounter  Medication Reason   linaclotide  (LINZESS ) 145 MCG CAPS capsule    doxycycline  (VIBRA -TABS) 100 MG tablet    cephALEXin  (KEFLEX ) 500 MG capsule    amitriptyline  (ELAVIL ) 75 MG tablet Reorder   gabapentin  (NEURONTIN ) 100 MG capsule Reorder    Return in  about 3 months (around 05/22/2024) for Neuropathy, constipation.        Subjective:   Encounter date: 02/20/2024  [name] is a 89 y.o. female who has Hyperlipidemia; Class 3 obesity (HCC); Symptomatic anemia; TENSION HEADACHE; Essential hypertension; History of DVT (deep vein thrombosis); Chronic diastolic CHF (congestive heart failure) (HCC); Chronic anticoagulation; Hiatal hernia with GERD and esophagitis; Weakness; Insomnia; Estrogen deficiency; COPD with chronic bronchitis (HCC); Drug-induced constipation; Vertigo; Gout; Vitamin D  deficiency; Endometrial polyp; GERD with esophagitis; Dementia (HCC); Moderate nonproliferative diabetic retinopathy of both eyes without macular edema associated with type 2 diabetes mellitus (HCC); Chronic radicular lumbar pain; Lumbar spondylosis; Spinal stenosis, lumbar region, with neurogenic claudication; Bilateral primary osteoarthritis of knee; Iron  deficiency anemia; Chronic pain of right hand; Breast cancer (HCC); Severe episode of recurrent major depressive disorder, without psychotic features (HCC); Pneumobilia; Dermatosis papulosa nigra; CKD (chronic kidney disease), stage IV (HCC); Type 2 diabetes mellitus with stage 4 chronic kidney disease, with long-term current use of insulin  (HCC); Melena; GAD (generalized anxiety disorder); Allergic rhinitis; Diabetic peripheral neuropathy associated with type 2 diabetes mellitus (HCC); Swelling of right upper extremity; Carpal tunnel syndrome on both sides; Very limited skin mobility; Chronic left hip pain; Dependent for medication administration; Chills; Upper GI bleed; CAD (coronary artery disease); Epigastric abdominal tenderness; Chronic ethmoidal sinusitis; ABLA (acute blood loss anemia); Recurrent acute suppurative otitis media of right ear without spontaneous rupture of tympanic membrane; Viral URI with cough; Concha bullosa; Tinnitus of both ears; PND (post-nasal drip); Limited mobility; Hyperkalemia; Whole  body pain; Hyperglycemia due to diabetes mellitus (HCC); Unstable angina (HCC); Nonintractable headache; Gait abnormality; Chronic migraine w/o aura w/o status migrainosus, not intractable; and Laceration  of right great toe without foreign body present or damage to nail on their problem list..   She  has a past medical history of Abdominal pain (01/26/2012), Acute deep vein thrombosis (DVT) of femoral vein of right lower extremity (HCC) (03/26/2022), Acute kidney injury superimposed on chronic kidney disease (12/18/2014), Acute upper GI bleed (03/26/2022), Allergy , Anemia, unspecified, Anxiety, Anxiety associated with depression (06/08/2017), Arthritis, Breast cancer (HCC), Breast cancer screening (02/19/2014), Breast mass (10/27/2014), Chronic diastolic CHF (congestive heart failure) (HCC), Chronic pain syndrome (02/06/2015), CKD (chronic kidney disease), stage III (HCC), Coronary artery disease, Debility (08/01/2012), Dementia (HCC), Depression, Diabetes mellitus, DOE (dyspnea on exertion) (06/24/2014), Dysphagia, unspecified(787.20) (07/31/2012), Fungal dermatitis (11/13/2007), GERD (gastroesophageal reflux disease), Gout, unspecified, Headache, History of anemia due to chronic kidney disease (03/26/2022), History of blood clots, History of deep venous thrombosis (01/27/2012), History of pulmonary embolus (PE) (04/22/2014), Hyperkalemia (07/26/2017), Hyperlipidemia, Hypertension, Insomnia, Joint pain, Joint swelling, Morbid obesity (HCC), Multiple benign nevi (11/15/2016), Myocardial infarction (HCC) (2004), Non-STEMI (non-ST elevated myocardial infarction) (HCC) (02/15/2020), Nonintractable headache (01/07/2008), Numbness, Obstructive sleep apnea, Osteoarthritis, Osteoarthritis, Osteoarthrosis, unspecified whether generalized or localized, lower leg, Pain in the chest (12/31/2013), Pain, chronic, Personal history of other diseases of the digestive system (12/03/2006), Polymyalgia rheumatica, Presence of stent  in right coronary artery, Pulmonary emboli (HCC) (01/2012), Situational depression (06/04/2009), Syncope, vasovagal (07/04/2021), Type 2 diabetes mellitus with hyperlipidemia (HCC) (02/16/2022), Urinary incontinence, Vaginitis and vulvovaginitis (06/23/2016), Vertigo, and Wears dentures..   She presents with chief complaint of Wound Check (1 week follow up for wound on big toe. Pt states she is experiencing mild pain and numbness) and Migraine (Pt c/o of migraine ) .   Discussed the use of AI scribe software for clinical note transcription with the patient, who gave verbal consent to proceed.  History of Present Illness 01/16/2024  [name] is an 89 year old female with CKD and gastritis who presents for follow-up after hospitalization for acute blood loss anemia. She is accompanied by her daughter, Sharlet Kraft, who is her primary caregiver.  Acute blood loss anemia - Hospitalized at [facility] from August 20 to December 28, 2023, for acute blood loss anemia secondary to CKD and gastritis - Experienced syncope during hospitalization, likely due to anemia - Received two units of packed red blood cells, with hemoglobin improving from 5.6 to 7.7 at discharge - No recent episodes of melena or blood in stool since discharge - Not taking oral iron  supplements since discharge Telephone follow-up on 01/02/2024 Patient reports good compliance with treatment Condition improved since discharge  Gastrointestinal symptoms - Large hiatal hernia with mild gastritis confirmed by EGD, negative for H. pylori - Transitioned from IV Protonix  to oral pantoprazole  40 mg daily for three months for GI prophylaxis - Improvement in heartburn symptoms since starting pantoprazole  - No recent use of Tums  Chronic kidney disease - CKD stage 4, stable  Anticoagulation therapy - Chronic anticoagulation with Eliquis  5 mg BID for history of DVT  Cutaneous ulceration - Cut under big toe present  for about a month - Treated with strips, solution, and Neosporin - No bleeding, drainage, or significant pain - Appears to be healing - Occasional pain in big toe  Chronic pain - Occasional pain in arm and pelvis  Cognitive impairment - Dementia, stable  Cardiopulmonary disease - Coronary artery disease and congestive heart failure, stable - Chronic obstructive pulmonary disease, stable  Diabetes mellitus with neuropathy - Type 2 diabetes with neuropathy - Insulin  glargine Toujeo  among current  medications  Obesity and mobility limitation - Morbid obesity with limited mobility - Wheelchair dependent most of the time  02/20/2024  [name] is an 89 year old female who presents with a chronic foot wound follow-up.  Chronic right great toe wound - Chronic wound on the right great toe present for over two months - Imaging performed one month ago - Current wound care includes Epsom salt soaks and Neosporin  Migraine headaches - History of migraines under neurology care - Recent severe headache affecting the entire head - Took Ubrelvy  last night; uncertain of effectiveness - Previous medication adjustment by counselor provided relief for one to two weeks  Right ear symptoms - Sensation of right ear being 'stopped up' with muffled hearing - Similar symptoms previously treated in December - Has used oil in the ear - Desires evaluation by ENT specialist  Peripheral neuropathy symptoms - Numbness and burning in the feet, more pronounced on the right side - Sharp pains in the leg bones - Takes amitriptyline  75 mg at night and gabapentin  100 mg at night  Constipation - Chronic constipation - Uses Linzess  but has difficulty with timing due to its effects     ROS  Past Surgical History:  Procedure Laterality Date   ABDOMINAL HYSTERECTOMY     partial   APPENDECTOMY     blood clots/legs and lungs  2013   BREAST BIOPSY Left 07/22/2014   BREAST BIOPSY Left  02/10/2013   BREAST LUMPECTOMY Left 11/05/2014   BREAST LUMPECTOMY WITH RADIOACTIVE SEED LOCALIZATION Left 11/05/2014   Procedure: LEFT BREAST LUMPECTOMY WITH RADIOACTIVE SEED LOCALIZATION;  Surgeon: Vicenta Poli, MD;  Location: [facility] OR;  Service: General;  Laterality: Left;   CARDIAC CATHETERIZATION     COLONOSCOPY     CORONARY ANGIOPLASTY  2   CORONARY STENT INTERVENTION N/A 02/17/2020   Procedure: CORONARY STENT INTERVENTION;  Surgeon: Anner Alm ORN, MD;  Location: [facility] INVASIVE CV LAB;  Service: Cardiovascular;  Laterality: N/A;   ESOPHAGOGASTRODUODENOSCOPY (EGD) WITH PROPOFOL  N/A 11/07/2016   Procedure: ESOPHAGOGASTRODUODENOSCOPY (EGD) WITH PROPOFOL ;  Surgeon: Avram Lupita BRAVO, MD;  Location: [facility] ENDOSCOPY;  Service: Endoscopy;  Laterality: N/A;   ESOPHAGOGASTRODUODENOSCOPY (EGD) WITH PROPOFOL  N/A 01/05/2023   Procedure: ESOPHAGOGASTRODUODENOSCOPY (EGD) WITH PROPOFOL ;  Surgeon: Abran Norleen SAILOR, MD;  Location: [facility] ENDOSCOPY;  Service: Gastroenterology;  Laterality: N/A;   EXCISION OF SKIN TAG Right 11/05/2014   Procedure: EXCISION OF RIGHT EYELID SKIN TAG;  Surgeon: Vicenta Poli, MD;  Location: [facility] OR;  Service: General;  Laterality: Right;   EYE SURGERY Bilateral    cataract    GASTRIC BYPASS  1977    reversed in 1979, [facility]   LEFT HEART CATH AND CORONARY ANGIOGRAPHY N/A 02/17/2020   Procedure: LEFT HEART CATH AND CORONARY ANGIOGRAPHY;  Surgeon: Anner Alm ORN, MD;  Location: [facility] INVASIVE CV LAB;  Service: Cardiovascular;  Laterality: N/A;   LEFT HEART CATHETERIZATION WITH CORONARY ANGIOGRAM N/A 06/29/2014   Procedure: LEFT HEART CATHETERIZATION WITH CORONARY ANGIOGRAM;  Surgeon: Debby DELENA Sor, MD;  Location: [facility] CATH LAB;  Service: Cardiovascular;  Laterality: N/A;   MEMBRANE PEEL Right 10/23/2018   Procedure: MEMBRANE PEEL;  Surgeon: Elner Arley DELENA, MD;  Location: [facility] OR;  Service: Ophthalmology;  Laterality: Right;   MI with stent placement  2004   PARS PLANA VITRECTOMY Right  10/23/2018   Procedure: PARS PLANA VITRECTOMY WITH 25 GAUGE;  Surgeon: Elner Arley DELENA, MD;  Location: [facility] OR;  Service: Ophthalmology;  Laterality: Right;    Outpatient  Medications Prior to Visit  Medication Sig Dispense Refill   albuterol  (PROAIR  HFA) 108 (90 Base) MCG/ACT inhaler Inhale 1-2 puffs into the lungs every 6 (six) hours as needed for wheezing or shortness of breath. 6.7 g 1   atorvastatin  (LIPITOR ) 80 MG tablet TAKE 1 TABLET BY MOUTH EVERY DAY 90 tablet 3   azelastine  (ASTELIN ) 0.1 % nasal spray Place 2 sprays into both nostrils 2 (two) times daily. 30 mL 12   calcitRIOL  (ROCALTROL ) 0.25 MCG capsule Take 0.25 mcg by mouth daily.     carvedilol  (COREG ) 12.5 MG tablet TAKE 1 TABLET BY MOUTH 2 TIMES DAILY 180 tablet 1   cetirizine  (ZYRTEC ) 10 MG tablet Take 10 mg by mouth daily as needed for allergies or rhinitis.     Continuous Glucose Receiver (FREESTYLE LIBRE 3 READER) DEVI Continuous glucose monitor 1 each 0   Continuous Glucose Sensor (FREESTYLE LIBRE 3 PLUS SENSOR) MISC 1 Device by Other route every 14 (fourteen) days. Change sensor every 15 days. 6 each 3   diclofenac  Sodium (VOLTAREN ) 1 % GEL Apply 2 g topically 4 (four) times daily. 150 g 2   ELIQUIS  5 MG TABS tablet TAKE 1 TABLET BY MOUTH 2 TIMES DAILY 180 tablet 3   febuxostat  (ULORIC ) 40 MG tablet TAKE 1 TABLET BY MOUTH EVERY DAY 90 tablet 3   fluticasone  (FLONASE ) 50 MCG/ACT nasal spray Place 2 sprays into both nostrils daily. 16 g 6   folic acid  (FOLVITE ) 1 MG tablet TAKE 1 TABLET BY MOUTH EVERY DAY 90 tablet 1   glucagon  1 MG injection Inject 1 mg into the vein once as needed for up to 1 dose. 1 each 12   glucose 4 GM chewable tablet Chew 1 tablet (4 g total) by mouth as needed for low blood sugar. 50 tablet 12   hydrOXYzine  (ATARAX ) 25 MG tablet TAKE 1 TABLET BY MOUTH EVERY 8 HOURS AS NEEDED FOR ANXIETY OR ITCHING (NASAL AND THROAT CONGESTION) 120 tablet 0   insulin  glargine, 2 Unit Dial , (TOUJEO  MAX SOLOSTAR) 300  UNIT/ML Solostar Pen Inject 40 units subcutaneously one daily 15 mL 3   meclizine  (ANTIVERT ) 25 MG tablet Take 1 tablet (25 mg total) by mouth 2 (two) times daily. 180 tablet 1   melatonin 5 MG TABS Take 1 tablet (5 mg total) by mouth at bedtime as needed. 15 tablet 0   memantine  (NAMENDA ) 5 MG tablet TAKE 2 TABLETS BY MOUTH EVERY MORNING and TAKE 1 TABLET EVERY EVENING 270 tablet 5   montelukast  (SINGULAIR ) 10 MG tablet Take 1 tablet (10 mg total) by mouth at bedtime. 30 tablet 3   mupirocin ointment (BACTROBAN) 2 % Apply 1 Application topically.     nitroGLYCERIN  (NITROSTAT ) 0.4 MG SL tablet Dissolve 1 tablet under the tongue every 5 minutes as needed for chest pain. Max of 3 doses, then 911. 25 tablet 6   NOVOLOG  FLEXPEN 100 UNIT/ML FlexPen INJECT 14 UNITS SUBCUTANEOUSLY TWICE DAILY WITH A MEAL . DO NOT EXCEED 70 PER 24 HOURS 15 mL 0   nystatin  (MYCOSTATIN /NYSTOP ) powder Apply 1 Application topically 3 (three) times daily. 60 g 3   ondansetron  (ZOFRAN ) 4 MG tablet TAKE 1 TABLET BY MOUTH EVERY 8 HOURS AS NEEDED FOR NAUSEA FOR VOMITING     ondansetron  (ZOFRAN -ODT) 4 MG disintegrating tablet Take 1 tablet (4 mg total) by mouth every 8 (eight) hours as needed for nausea or vomiting. 20 tablet 0   oxyCODONE -acetaminophen  (PERCOCET) 7.5-325 MG tablet Take 1 tablet  by mouth every 12 (twelve) hours.     pantoprazole  (PROTONIX ) 20 MG tablet TAKE 1 TABLET BY MOUTH ONCE DAILY 30 tablet 5   ranolazine  (RANEXA ) 500 MG 12 hr tablet Take 1 tablet (500 mg total) by mouth 2 (two) times daily. 60 tablet 1   Semaglutide  (RYBELSUS ) 7 MG TABS Take 1 tablet (7 mg total) by mouth daily. 30 tablet 1   sodium zirconium cyclosilicate  (LOKELMA ) 10 g PACK packet USE 1 PACKET BY MOUTH AS DIRECTED DAILY     torsemide  (DEMADEX ) 20 MG tablet TAKE 1 TABLET BY MOUTH EVERY DAY TAKE AN EXTRA DOSE FOR WEIGHT GAIN OF 3LBs IN 1 DAY OR 5LBS IN 1 WEEK OR FOR WORSENING SWELLING 30 tablet 1   traMADol  (ULTRAM ) 50 MG tablet TAKE 1 TABLET  BY MOUTH EVERY 12 HOURS AS NEEDED TAKE THE FIRST 4 DOSES WITH ZOFRAN  TO MINIMIZE NAUSEA     TYLENOL  500 MG tablet Take 1,000 mg by mouth every 6 (six) hours as needed for mild pain or headache.     TYLENOL  PM EXTRA STRENGTH 500-25 MG TABS tablet Take 2 tablets by mouth at bedtime.     Ubrogepant  (UBRELVY ) 50 MG TABS Take 1 tab at onset of migraine.  May repeat in 2 hrs, if needed.  Max dose: 2 tabs/day. This is a 30 day prescription. 12 tablet 6   Vitamin D , Ergocalciferol , (DRISDOL ) 1.25 MG (50000 UNIT) CAPS capsule TAKE 1 CAPSULE BY MOUTH ONCE WEEKLY ON MONDAY 4 capsule 1   amitriptyline  (ELAVIL ) 75 MG tablet Take 1 tablet (75 mg total) by mouth at bedtime. 90 tablet 0   cephALEXin  (KEFLEX ) 500 MG capsule Take 500 mg by mouth 2 (two) times daily.     doxycycline  (VIBRA -TABS) 100 MG tablet Take 100 mg by mouth 2 (two) times daily. for 10 days     gabapentin  (NEURONTIN ) 100 MG capsule Take 1 capsule (100 mg total) by mouth at bedtime. 90 capsule 3   linaclotide  (LINZESS ) 145 MCG CAPS capsule TAKE 1 CAPSULE BY MOUTH ONCE DAILY BEFORE BREAKFAST     ipratropium-albuterol  (DUONEB) 0.5-2.5 (3) MG/3ML SOLN Take 3 mLs by nebulization every 6 (six) hours as needed (cough, wheezing, or shortness of breath). 360 mL 0   No facility-administered medications prior to visit.    Family History  Problem Relation Age of Onset   Breast cancer Mother 58   Heart disease Mother    Throat cancer Father    Hypertension Father    Arthritis Father    Diabetes Father    Arthritis Sister    Obesity Sister    Diabetes Sister    Heart disease Cousin    Colon cancer Neg Hx    Stomach cancer Neg Hx    Esophageal cancer Neg Hx     Social History   Socioeconomic History   Marital status: Widowed    Spouse name: Not on file   Number of children: 3   Years of education: Not on file   Highest education level: High school graduate  Occupational History   Occupation: retired  Tobacco Use   Smoking status: Never     Passive exposure: Past   Smokeless tobacco: Never   Tobacco comments:    Both parents smoked, patient was exposed/ Raised up in smoke, my whole life.  Vaping Use   Vaping status: Never Used  Substance and Sexual Activity   Alcohol use: No    Alcohol/week: 0.0 standard drinks of alcohol  Drug use: No   Sexual activity: Not Currently    Birth control/protection: None  Other Topics Concern   Not on file  Social History Narrative   Not on file   Social Drivers of Health   Financial Resource Strain: Low Risk  (09/28/2023)   Overall Financial Resource Strain (CARDIA)    Difficulty of Paying Living Expenses: Not hard at all  Food Insecurity: No Food Insecurity (10/05/2023)   Hunger Vital Sign    Worried About Running Out of Food in the Last Year: Never true    Ran Out of Food in the Last Year: Never true  Transportation Needs: No Transportation Needs (12/27/2023)   Received from [facility]   PRAPARE - Transportation    Lack of Transportation (Medical): No    Lack of Transportation (Non-Medical): No  Physical Activity: Inactive (09/28/2023)   Exercise Vital Sign    Days of Exercise per Week: 0 days    Minutes of Exercise per Session: 0 min  Stress: Stress Concern Present (09/28/2023)   [employer] of Occupational Health - Occupational Stress Questionnaire    Feeling of Stress : To some extent  Social Connections: Socially Isolated (10/05/2023)   Social Connection and Isolation Panel    Frequency of Communication with Friends and Family: Three times a week    Frequency of Social Gatherings with Friends and Family: More than three times a week    Attends Religious Services: Never    Database administrator or Organizations: No    Attends Banker Meetings: Never    Marital Status: Widowed  Intimate Partner Violence: Not At Risk (12/27/2023)   Received from [facility]   Humiliation, Afraid, Rape, and Kick questionnaire    Within  the last year, have you been afraid of your partner or ex-partner?: No    Within the last year, have you been humiliated or emotionally abused in other ways by your partner or ex-partner?: No    Within the last year, have you been kicked, hit, slapped, or otherwise physically hurt by your partner or ex-partner?: No    Within the last year, have you been raped or forced to have any kind of sexual activity by your partner or ex-partner?: No                                                                                                  Objective:  Physical Exam: BP 133/64 (BP Location: Left Arm, Patient Position: Sitting, Cuff Size: Large)   Pulse 80   Temp (!) 97.1 F (36.2 C) (Temporal)   Resp 18   SpO2 100%     Physical Exam Constitutional:      Appearance: She is obese. She is not ill-appearing.  HENT:     Head: Normocephalic and atraumatic.     Right Ear: External ear normal. Tenderness present. A middle ear effusion is present. Tympanic membrane is injected, erythematous and bulging.     Left Ear: Hearing, tympanic membrane and ear canal normal.     Nose: Nose normal.  Eyes:  General: No scleral icterus.       Right eye: No discharge.        Left eye: No discharge.     Extraocular Movements: Extraocular movements intact.  Cardiovascular:     Rate and Rhythm: Normal rate and regular rhythm.     Heart sounds: Normal heart sounds.     Comments: No cyanosis, no JVD Pulmonary:     Effort: Pulmonary effort is normal.     Breath sounds: Normal breath sounds. No decreased air movement.     Comments: No auditory wheezing Abdominal:     General: Abdomen is flat. There is no distension.     Palpations: Abdomen is soft.     Tenderness: There is no abdominal tenderness.  Musculoskeletal:     Right lower leg: Edema present.     Left lower leg: Edema present.  Feet:     Right foot:     Skin integrity: No skin breakdown (healed).  Skin:    General: Skin is warm.      Findings: No rash.  Neurological:     General: No focal deficit present.     Mental Status: She is alert.     Cranial Nerves: No cranial nerve deficit.     Motor: Weakness (Wheelchair-bound) present.  Psychiatric:        Attention and Perception: She is attentive.        Judgment: Judgment is not impulsive.     DG Toe Great Right Result Date: 01/16/2024 CLINICAL DATA:  Cut, great toe pain EXAM: RIGHT GREAT TOE COMPARISON:  None Available. FINDINGS: Osteopenia.No acute fracture or dislocation. There is no evidence of arthropathy or other focal bone abnormality. No subcutaneous gas. No radiopaque foreign body. IMPRESSION: No acute fracture or dislocation. No radiographic findings of osteomyelitis, at this time. Electronically Signed   By: Rogelia Myers M.D.   On: 01/16/2024 17:41   CT CHEST ABDOMEN PELVIS WO CONTRAST Result Date: 11/28/2023 CLINICAL DATA:  chest pain and abdominal pain EXAM: CT CHEST, ABDOMEN AND PELVIS WITHOUT CONTRAST TECHNIQUE: Multidetector CT imaging of the chest, abdomen and pelvis was performed following the standard protocol without IV contrast. RADIATION DOSE REDUCTION: This exam was performed according to the departmental dose-optimization program which includes automated exposure control, adjustment of the mA and/or kV according to patient size and/or use of iterative reconstruction technique. COMPARISON:  CT of the abdomen and pelvis dated June 04, 2023. FINDINGS: CT CHEST FINDINGS Cardiovascular: Severe calcific coronary artery disease. The heart is normal in size. There is no pericardial effusion. The thoracic aorta demonstrates mild to moderate diffuse calcific atheromatous disease. Mediastinum/Nodes: The trachea and mainstem bronchi are unremarkable. The subacute appears normal. There are few shotty mediastinal lymph nodes. Lungs/Pleura: There are few ground-glass and reticular opacities present dependently within the lower lobes bilaterally. There is mild  bronchiolectasis also present within the lower lobes. There are few hazy and reticular opacities present laterally within the right upper lobe. There are no pleural effusions present. No pneumothorax. Musculoskeletal: The bones are intact. There is no apparent soft tissue injury. CT ABDOMEN PELVIS FINDINGS Hepatobiliary: There are calcified stones lying dependently within the gallbladder. There is no evidence of cholecystitis. The liver is unremarkable. There is no biliary ductal dilatation. Pancreas: Normal. Spleen: Negative. Adrenals/Urinary Tract: The adrenal glands are unremarkable. There are simple appearing cysts within the kidneys bilaterally. No solid lesions or calculi are evident. There is no evidence of acute traumatic injury. The ureters are normal in caliber  and free of calculi. The urinary bladder is unremarkable. Stomach/Bowel: Surgical clips are noted within the proximal stomach. There are small bowel anastomosis sutures present bilaterally. There is no evidence of acute bowel injury. There are several sigmoid diverticula present. There is no evidence of diverticulitis. Vascular/Lymphatic: The abdominal aorta demonstrates moderate calcific atheromatous disease. It is normal in caliber. Reproductive: The uterus and adnexa are unremarkable. Other: The patient is status post abdominal herniorrhaphy. There is no free fluid or pelvic lymphadenopathy present. Musculoskeletal: Extensive degenerative changes throughout the lumbar spine. No evidence of acute traumatic injury. The bony pelvis is intact. IMPRESSION: 1. No evidence of acute traumatic injury of the chest, abdomen or pelvis. 2. Status post gastric surgery and partial small-bowel resection. Electronically Signed   By: Evalene Coho M.D.   On: 11/28/2023 14:22   CT Head Wo Contrast Result Date: 11/28/2023 CLINICAL DATA:  Headache, increasing frequency or severity EXAM: CT HEAD WITHOUT CONTRAST TECHNIQUE: Contiguous axial images were obtained  from the base of the skull through the vertex without intravenous contrast. RADIATION DOSE REDUCTION: This exam was performed according to the departmental dose-optimization program which includes automated exposure control, adjustment of the mA and/or kV according to patient size and/or use of iterative reconstruction technique. COMPARISON:  CT the head dated Sep 25, 2023. FINDINGS: Brain: There is age related atrophy and moderate diffuse cerebral white matter disease. There is no evidence of hemorrhage, mass, acute cortical infarct or hydrocephalus. There is a dystrophic calcification again demonstrated within the pons posterolaterally on the right. There is also prominent dystrophic calcification along the left tentorial leaflet anteriorly, as before. Vascular: Mild to moderate calcific atheromatous disease within the carotid siphons. Skull: Hyperostosis frontalis interna, normal variant. No osseous lesions present. Sinuses/Orbits: Moderate opacification of the right frontal and anterior ethmoid air cells. Status post bilateral lens replacement. Other: None. IMPRESSION: 1. Age-related atrophy and moderate diffuse cerebral white matter disease. No apparent acute process. 2. Right frontal and ethmoid sinus disease. Electronically Signed   By: Evalene Coho M.D.   On: 11/28/2023 14:12   US  Venous Img Lower Right (DVT Study) Result Date: 11/23/2023 CLINICAL DATA:  Right lower leg swelling x1 week. EXAM: RIGHT LOWER EXTREMITY VENOUS DOPPLER ULTRASOUND TECHNIQUE: Gray-scale sonography with graded compression, as well as color Doppler and duplex ultrasound were performed to evaluate the lower extremity deep venous systems from the level of the common femoral vein and including the common femoral, femoral, profunda femoral, popliteal and calf veins including the posterior tibial, peroneal and gastrocnemius veins when visible. The superficial great saphenous vein was also interrogated. Spectral Doppler was utilized  to evaluate flow at rest and with distal augmentation maneuvers in the common femoral, femoral and popliteal veins. COMPARISON:  None Available. FINDINGS: Contralateral Common Femoral Vein: Respiratory phasicity is normal and symmetric with the symptomatic side. No evidence of thrombus. Normal compressibility. Common Femoral Vein: No evidence of thrombus. Normal compressibility, respiratory phasicity and response to augmentation. Saphenofemoral Junction: No evidence of thrombus. Normal compressibility and flow on color Doppler imaging. Profunda Femoral Vein: No evidence of thrombus. Normal compressibility and flow on color Doppler imaging. Femoral Vein: The RIGHT femoral vein is limited in visualization. Popliteal Vein: No evidence of thrombus. Normal compressibility, respiratory phasicity and response to augmentation. Calf Veins: The RIGHT posterior tibial vein is limited in visualization, while the RIGHT peroneal vein is not visualized. Superficial Great Saphenous Vein: No evidence of thrombus. Normal compressibility. Venous Reflux:  None. Other Findings: The study is technically limited  secondary to the patient's body habitus and shadowing from arterial calcifications, as per the ultrasound technologist. IMPRESSION: Limited evaluation of the RIGHT femoral vein, RIGHT posterior tibial vein and RIGHT peroneal vein, without evidence of DVT within the RIGHT extremity. Electronically Signed   By: Suzen Dials M.D.   On: 11/23/2023 19:29   DG Chest Port 1 View Result Date: 11/23/2023 CLINICAL DATA:  [id number] Leg swelling [id number] EXAM: PORTABLE CHEST - 1 VIEW COMPARISON:  None available. FINDINGS: Low lung volumes. Bilateral perihilar interstitial opacities. No focal airspace consolidation or pneumothorax. No cardiomegaly. Tortuous aorta with aortic atherosclerosis. No acute fracture or destructive lesions. Multilevel thoracic osteophytosis. IMPRESSION: Low lung volumes. Bilateral perihilar interstitial opacities,  which may reflect bronchovascular crowding, interstitial edema, or atypical/viral infection. Electronically Signed   By: Rogelia Myers M.D.   On: 11/23/2023 17:39    Recent Results (from the past 2160 hours)  Comprehensive metabolic panel     Status: Abnormal   Collection Time: 11/23/23  5:11 PM  Result Value Ref Range   Sodium 141 135 - 145 mmol/L   Potassium 4.5 3.5 - 5.1 mmol/L   Chloride 103 98 - 111 mmol/L   CO2 25 22 - 32 mmol/L   Glucose, Bld 77 70 - 99 mg/dL    Comment: Glucose reference range applies only to samples taken after fasting for at least 8 hours.   BUN 60 (H) 8 - 23 mg/dL   Creatinine, Ser 7.36 (H) 0.44 - 1.00 mg/dL   Calcium  8.7 (L) 8.9 - 10.3 mg/dL   Total Protein 7.3 6.5 - 8.1 g/dL   Albumin  3.5 3.5 - 5.0 g/dL   AST 18 15 - 41 U/L   ALT 9 0 - 44 U/L   Alkaline Phosphatase 94 38 - 126 U/L   Total Bilirubin 0.3 0.0 - 1.2 mg/dL   GFR, Estimated 17 (L) >60 mL/min    Comment: (NOTE) Calculated using the CKD-EPI Creatinine Equation (2021)    Anion gap 13 5 - 15    Comment: Performed at [facility], [address] [place], [place] [address]  CBC with Differential     Status: Abnormal   Collection Time: 11/23/23  5:11 PM  Result Value Ref Range   WBC 11.6 (H) 4.0 - 10.5 K/uL   RBC 2.84 (L) 3.87 - 5.11 MIL/uL   Hemoglobin 8.4 (L) 12.0 - 15.0 g/dL   HCT 72.4 (L) 63.9 - 53.9 %   MCV 96.8 80.0 - 100.0 fL   MCH 29.6 26.0 - 34.0 pg   MCHC 30.5 30.0 - 36.0 g/dL   RDW 84.0 (H) 88.4 - 84.4 %   Platelets 301 150 - 400 K/uL   nRBC 0.2 0.0 - 0.2 %   Neutrophils Relative % 60 %   Neutro Abs 7.0 1.7 - 7.7 K/uL   Lymphocytes Relative 29 %   Lymphs Abs 3.3 0.7 - 4.0 K/uL   Monocytes Relative 7 %   Monocytes Absolute 0.8 0.1 - 1.0 K/uL   Eosinophils Relative 3 %   Eosinophils Absolute 0.3 0.0 - 0.5 K/uL   Basophils Relative 0 %   Basophils Absolute 0.0 0.0 - 0.1 K/uL   Immature Granulocytes 1 %   Abs Immature Granulocytes 0.16 (H) 0.00 - 0.07  K/uL    Comment: Performed at [facility], [address] [place], [place] [address]  Pro Brain natriuretic peptide     Status: Abnormal   Collection Time: 11/23/23  5:11 PM  Result  Value Ref Range   Pro Brain Natriuretic Peptide 688.0 (H) <300.0 pg/mL    Comment: (NOTE) Age Group        Cut-Points    Interpretation  < 50 years     450 pg/mL       NT-proBNP > 450 pg/mL indicates                                ADHF is likely              50 to 75 years  900 pg/mL      NT-proBNP > 900 pg/mL indicates          ADHF is likely  > 75 years      1800 pg/mL     NT-proBNP > 1800 pg/mL indicates          ADHF is likely                           All ages    Results between       Indeterminate. Further clinical             300 and the cut-   information is needed to determine            point for age group   if ADHF is present.                                                             Elecsys proBNP II/ Elecsys proBNP II STAT           Cut-Point                       Interpretation  300 pg/mL                    NT-proBNP <300pg/mL indicates                             ADHF is not likely  Performed at [facility], [address] [place], [place] [address]   Troponin T, High Sensitivity     Status: Abnormal   Collection Time: 11/23/23  5:11 PM  Result Value Ref Range   Troponin T High Sensitivity 61 (H) <19 ng/L    Comment: (NOTE) Biotin concentrations > 1000 ng/mL falsely decrease TnT results.  Serial cardiac troponin measurements are suggested.  Refer to the Links section for chest pain algorithms and additional  guidance. Performed at [facility], [address] [place], [place] [address]   Troponin T, High Sensitivity     Status: Abnormal   Collection Time: 11/23/23  7:12 PM  Result Value Ref Range   Troponin T High Sensitivity 59 (H) <19 ng/L    Comment: (NOTE) Biotin concentrations > 1000 ng/mL falsely  decrease TnT results.  Serial cardiac troponin measurements are suggested.  Refer to the Links section for chest pain algorithms and additional  guidance. Performed at [facility], [address], [place], [place] [address]   Iron  and Iron  Binding Capacity ([facility] only)     Status: Abnormal  Collection Time: 11/26/23  1:16 PM  Result Value Ref Range   Iron  50 28 - 170 ug/dL   TIBC 778 (L) 749 - 549 ug/dL   Saturation Ratios 23 10.4 - 31.8 %   UIBC 171 148 - 442 ug/dL    Comment: Performed at [facility], [address], [place], [place] [address]  Ferritin     Status: None   Collection Time: 11/26/23  1:16 PM  Result Value Ref Range   Ferritin 210 11 - 307 ng/mL    Comment: Performed at [facility], [address] [place], [place] [address]  CMP (Cancer Center only)     Status: Abnormal   Collection Time: 11/26/23  1:16 PM  Result Value Ref Range   Sodium 140 135 - 145 mmol/L   Potassium 4.2 3.5 - 5.1 mmol/L   Chloride 104 98 - 111 mmol/L   CO2 28 22 - 32 mmol/L   Glucose, Bld 157 (H) 70 - 99 mg/dL    Comment: Glucose reference range applies only to samples taken after fasting for at least 8 hours.   BUN 68 (H) 8 - 23 mg/dL   Creatinine 7.16 (H) 9.55 - 1.00 mg/dL   Calcium  8.4 (L) 8.9 - 10.3 mg/dL   Total Protein 7.0 6.5 - 8.1 g/dL   Albumin  3.2 (L) 3.5 - 5.0 g/dL   AST 12 (L) 15 - 41 U/L   ALT 6 0 - 44 U/L   Alkaline Phosphatase 79 38 - 126 U/L   Total Bilirubin 0.4 0.0 - 1.2 mg/dL   GFR, Estimated 16 (L) >60 mL/min    Comment: (NOTE) Calculated using the CKD-EPI Creatinine Equation (2021)    Anion gap 8 5 - 15    Comment: Performed at [facility], [address] [place], [place] [address]  CBC with Differential (Cancer Center Only)     Status: Abnormal   Collection Time: 11/26/23  1:16 PM  Result Value Ref Range   WBC Count 13.5 (H) 4.0 - 10.5 K/uL   RBC 2.78 (L)  3.87 - 5.11 MIL/uL   Hemoglobin 8.0 (L) 12.0 - 15.0 g/dL   HCT 73.4 (L) 63.9 - 53.9 %   MCV 95.3 80.0 - 100.0 fL   MCH 28.8 26.0 - 34.0 pg   MCHC 30.2 30.0 - 36.0 g/dL   RDW 84.2 (H) 88.4 - 84.4 %   Platelet Count 278 150 - 400 K/uL   nRBC 0.2 0.0 - 0.2 %   Neutrophils Relative % 67 %   Neutro Abs 9.1 (H) 1.7 - 7.7 K/uL   Lymphocytes Relative 22 %   Lymphs Abs 3.0 0.7 - 4.0 K/uL   Monocytes Relative 7 %   Monocytes Absolute 0.9 0.1 - 1.0 K/uL   Eosinophils Relative 3 %   Eosinophils Absolute 0.3 0.0 - 0.5 K/uL   Basophils Relative 0 %   Basophils Absolute 0.1 0.0 - 0.1 K/uL   Immature Granulocytes 1 %   Abs Immature Granulocytes 0.16 (H) 0.00 - 0.07 K/uL    Comment: Performed at [facility], [address], [place], [place] [address]  CBC with Differential     Status: Abnormal   Collection Time: 11/28/23  1:34 PM  Result Value Ref Range   WBC 16.2 (H) 4.0 - 10.5 K/uL   RBC 2.52 (L) 3.87 - 5.11 MIL/uL   Hemoglobin 7.4 (L) 12.0 - 15.0 g/dL   HCT 75.2 (  L) 36.0 - 46.0 %   MCV 98.0 80.0 - 100.0 fL   MCH 29.4 26.0 - 34.0 pg   MCHC 30.0 30.0 - 36.0 g/dL   RDW 84.4 88.4 - 84.4 %   Platelets 262 150 - 400 K/uL   nRBC 0.3 (H) 0.0 - 0.2 %   Neutrophils Relative % 71 %   Neutro Abs 11.4 (H) 1.7 - 7.7 K/uL   Lymphocytes Relative 18 %   Lymphs Abs 3.0 0.7 - 4.0 K/uL   Monocytes Relative 6 %   Monocytes Absolute 1.0 0.1 - 1.0 K/uL   Eosinophils Relative 3 %   Eosinophils Absolute 0.5 0.0 - 0.5 K/uL   Basophils Relative 0 %   Basophils Absolute 0.1 0.0 - 0.1 K/uL   Immature Granulocytes 2 %   Abs Immature Granulocytes 0.30 (H) 0.00 - 0.07 K/uL    Comment: Performed at [facility], [address] [place], [place] [address]  Comprehensive metabolic panel     Status: Abnormal   Collection Time: 11/28/23  1:34 PM  Result Value Ref Range   Sodium 133 (L) 135 - 145 mmol/L   Potassium 4.3 3.5 - 5.1 mmol/L   Chloride 100 98 - 111 mmol/L   CO2 21 (L) 22  - 32 mmol/L   Glucose, Bld 121 (H) 70 - 99 mg/dL    Comment: Glucose reference range applies only to samples taken after fasting for at least 8 hours.   BUN 73 (H) 8 - 23 mg/dL   Creatinine, Ser 6.90 (H) 0.44 - 1.00 mg/dL   Calcium  7.5 (L) 8.9 - 10.3 mg/dL   Total Protein 6.1 (L) 6.5 - 8.1 g/dL   Albumin  2.3 (L) 3.5 - 5.0 g/dL   AST 17 15 - 41 U/L   ALT 9 0 - 44 U/L   Alkaline Phosphatase 68 38 - 126 U/L   Total Bilirubin 0.4 0.0 - 1.2 mg/dL   GFR, Estimated 14 (L) >60 mL/min    Comment: (NOTE) Calculated using the CKD-EPI Creatinine Equation (2021)    Anion gap 12 5 - 15    Comment: Performed at [facility], [address] [place], [place] [address]  Lipase, blood     Status: None   Collection Time: 11/28/23  1:34 PM  Result Value Ref Range   Lipase 37 11 - 51 U/L    Comment: Performed at [facility], [address], [place], [place] [address]  Brain natriuretic peptide     Status: None   Collection Time: 11/28/23  1:34 PM  Result Value Ref Range   B Natriuretic Peptide 94.8 0.0 - 100.0 pg/mL    Comment: Performed at [facility], [address], [place], [place] [address]  Troponin I (High Sensitivity)     Status: None   Collection Time: 11/28/23  1:34 PM  Result Value Ref Range   Troponin I (High Sensitivity) 16 <18 ng/L    Comment: (NOTE) Elevated high sensitivity troponin I (hsTnI) values and significant  changes across serial measurements may suggest ACS but many other  chronic and acute conditions are known to elevate hsTnI results.  Refer to the Links section for chest pain algorithms and additional  guidance. Performed at [facility], [address], [place], [place] [address]   Urinalysis, Routine w reflex microscopic -Urine, Clean Catch     Status: Abnormal   Collection Time: 11/28/23  2:50 PM  Result Value Ref  Range   Color, Urine STRAW (A) YELLOW   APPearance CLEAR CLEAR   Specific Gravity, Urine 1.006 1.005 - 1.030   pH 5.0  5.0 - 8.0   Glucose, UA NEGATIVE NEGATIVE mg/dL   Hgb urine dipstick NEGATIVE NEGATIVE   Bilirubin Urine NEGATIVE NEGATIVE   Ketones, ur NEGATIVE NEGATIVE mg/dL   Protein, ur NEGATIVE NEGATIVE mg/dL   Nitrite NEGATIVE NEGATIVE   Leukocytes,Ua NEGATIVE NEGATIVE    Comment: Performed at [facility], [address] [place], [place] [address]  POCT glycosylated hemoglobin (Hb A1C)     Status: Abnormal   Collection Time: 12/12/23  8:46 AM  Result Value Ref Range   Hemoglobin A1C 7.4 (A) 4.0 - 5.6 %   HbA1c POC (<> result, manual entry)     HbA1c, POC (prediabetic range)     HbA1c, POC (controlled diabetic range)    Basic Metabolic Panel (BMET)     Status: Abnormal   Collection Time: 01/16/24  2:21 PM  Result Value Ref Range   Sodium 137 135 - 145 mEq/L   Potassium 4.5 3.5 - 5.1 mEq/L   Chloride 105 96 - 112 mEq/L   CO2 22 19 - 32 mEq/L   Glucose, Bld 136 (H) 70 - 99 mg/dL   BUN 76 (H) 6 - 23 mg/dL   Creatinine, Ser 7.05 (H) 0.40 - 1.20 mg/dL   GFR 86.08 (LL) >39.99 mL/min    Comment: Calculated using the CKD-EPI Creatinine Equation (2021)   Calcium  8.7 8.4 - 10.5 mg/dL  CBC w/Diff     Status: Abnormal   Collection Time: 01/16/24  2:21 PM  Result Value Ref Range   WBC 8.9 4.0 - 10.5 K/uL   RBC 2.97 (L) 3.87 - 5.11 Mil/uL   Hemoglobin 8.5 aL (L) 12.0 - 15.0 g/dL   HCT 73.2 aL (L) 63.9 - 46.0 %   MCV 90.0 78.0 - 100.0 fl   MCHC 31.8 30.0 - 36.0 g/dL   RDW 84.3 (H) 88.4 - 84.4 %   Platelets 239.0 150.0 - 400.0 K/uL   Neutrophils Relative % 65.0 43.0 - 77.0 %   Lymphocytes Relative 24.7 12.0 - 46.0 %   Monocytes Relative 6.6 3.0 - 12.0 %   Eosinophils Relative 3.4 0.0 - 5.0 %   Basophils Relative 0.3 0.0 - 3.0 %   Neutro Abs 5.8 1.4 - 7.7 K/uL   Lymphs Abs 2.2 0.7 - 4.0 K/uL   Monocytes Absolute 0.6 0.1 - 1.0 K/uL   Eosinophils Absolute 0.3 0.0 - 0.7 K/uL   Basophils Absolute 0.0 0.0 - 0.1 K/uL  Iron  and Iron  Binding Capacity ([facility] only)     Status: Abnormal    Collection Time: 01/28/24 12:55 PM  Result Value Ref Range   Iron  36 28 - 170 ug/dL   TIBC 760 (L) 749 - 549 ug/dL   Saturation Ratios 15 10.4 - 31.8 %   UIBC 203 148 - 442 ug/dL    Comment: Performed at [facility], [address] [place], [place] [address]  CMP (Cancer Center only)     Status: Abnormal   Collection Time: 01/28/24 12:55 PM  Result Value Ref Range   Sodium 136 135 - 145 mmol/L   Potassium 4.7 3.5 - 5.1 mmol/L   Chloride 105 98 - 111 mmol/L   CO2 24 22 - 32 mmol/L   Glucose, Bld 158 (H) 70 - 99 mg/dL    Comment: Glucose reference range applies only  to samples taken after fasting for at least 8 hours.   BUN 90 (H) 8 - 23 mg/dL   Creatinine 6.63 (H) 9.55 - 1.00 mg/dL   Calcium  8.7 (L) 8.9 - 10.3 mg/dL   Total Protein 7.3 6.5 - 8.1 g/dL   Albumin  3.5 3.5 - 5.0 g/dL   AST 10 (L) 15 - 41 U/L   ALT 7 0 - 44 U/L   Alkaline Phosphatase 91 38 - 126 U/L   Total Bilirubin 0.3 0.0 - 1.2 mg/dL   GFR, Estimated 13 (L) >60 mL/min    Comment: (NOTE) Calculated using the CKD-EPI Creatinine Equation (2021)    Anion gap 7 5 - 15    Comment: Performed at [facility], [address] [place], [place] [address]  CBC with Differential (Cancer Center Only)     Status: Abnormal   Collection Time: 01/28/24 12:55 PM  Result Value Ref Range   WBC Count 10.4 4.0 - 10.5 K/uL   RBC 2.78 (L) 3.87 - 5.11 MIL/uL   Hemoglobin 7.9 (L) 12.0 - 15.0 g/dL   HCT 74.4 (L) 63.9 - 53.9 %   MCV 91.7 80.0 - 100.0 fL   MCH 28.4 26.0 - 34.0 pg   MCHC 31.0 30.0 - 36.0 g/dL   RDW 85.3 88.4 - 84.4 %   Platelet Count 276 150 - 400 K/uL   nRBC 0.0 0.0 - 0.2 %   Neutrophils Relative % 70 %   Neutro Abs 7.4 1.7 - 7.7 K/uL   Lymphocytes Relative 20 %   Lymphs Abs 2.1 0.7 - 4.0 K/uL   Monocytes Relative 6 %   Monocytes Absolute 0.6 0.1 - 1.0 K/uL   Eosinophils Relative 4 %   Eosinophils Absolute 0.4 0.0 - 0.5 K/uL   Basophils Relative 0 %   Basophils  Absolute 0.0 0.0 - 0.1 K/uL   Immature Granulocytes 0 %   Abs Immature Granulocytes 0.03 0.00 - 0.07 K/uL    Comment: Performed at [facility], [address] [place], [place] [address]  Ferritin     Status: None   Collection Time: 01/28/24 12:56 PM  Result Value Ref Range   Ferritin 129 11 - 307 ng/mL    Comment: Performed at [facility], [address], [place], [place] [address]        Beverley Adine Hummer, MD, MS

## 2024-02-20 NOTE — Telephone Encounter (Signed)
 Signed plan of care orders faxed to [facility] at [phone]

## 2024-02-21 ENCOUNTER — Encounter (INDEPENDENT_AMBULATORY_CARE_PROVIDER_SITE_OTHER): Payer: Self-pay

## 2024-02-22 ENCOUNTER — Other Ambulatory Visit: Payer: Self-pay

## 2024-02-22 DIAGNOSIS — C50812 Malignant neoplasm of overlapping sites of left female breast: Secondary | ICD-10-CM

## 2024-02-22 DIAGNOSIS — D509 Iron deficiency anemia, unspecified: Secondary | ICD-10-CM

## 2024-02-25 ENCOUNTER — Inpatient Hospital Stay

## 2024-02-25 ENCOUNTER — Inpatient Hospital Stay: Attending: Internal Medicine

## 2024-02-25 DIAGNOSIS — I129 Hypertensive chronic kidney disease with stage 1 through stage 4 chronic kidney disease, or unspecified chronic kidney disease: Secondary | ICD-10-CM | POA: Diagnosis not present

## 2024-02-25 DIAGNOSIS — D631 Anemia in chronic kidney disease: Secondary | ICD-10-CM | POA: Diagnosis not present

## 2024-02-25 DIAGNOSIS — E875 Hyperkalemia: Secondary | ICD-10-CM | POA: Diagnosis not present

## 2024-02-25 DIAGNOSIS — N184 Chronic kidney disease, stage 4 (severe): Secondary | ICD-10-CM | POA: Diagnosis not present

## 2024-02-25 DIAGNOSIS — N2581 Secondary hyperparathyroidism of renal origin: Secondary | ICD-10-CM | POA: Diagnosis not present

## 2024-02-25 DIAGNOSIS — E1122 Type 2 diabetes mellitus with diabetic chronic kidney disease: Secondary | ICD-10-CM | POA: Diagnosis not present

## 2024-02-25 DIAGNOSIS — I509 Heart failure, unspecified: Secondary | ICD-10-CM | POA: Diagnosis not present

## 2024-02-25 LAB — LAB REPORT - SCANNED: PTH, Intact: 183

## 2024-02-26 ENCOUNTER — Other Ambulatory Visit: Payer: Self-pay | Admitting: Hematology

## 2024-02-27 ENCOUNTER — Observation Stay (HOSPITAL_COMMUNITY)
Admission: EM | Admit: 2024-02-27 | Discharge: 2024-02-29 | Disposition: A | Discharge status: Home / Self Care | Attending: Internal Medicine | Admitting: Internal Medicine

## 2024-02-27 ENCOUNTER — Encounter (HOSPITAL_COMMUNITY): Payer: Self-pay

## 2024-02-27 ENCOUNTER — Other Ambulatory Visit: Payer: Self-pay

## 2024-02-27 DIAGNOSIS — I130 Hypertensive heart and chronic kidney disease with heart failure and stage 1 through stage 4 chronic kidney disease, or unspecified chronic kidney disease: Secondary | ICD-10-CM | POA: Insufficient documentation

## 2024-02-27 DIAGNOSIS — E785 Hyperlipidemia, unspecified: Secondary | ICD-10-CM | POA: Diagnosis not present

## 2024-02-27 DIAGNOSIS — M109 Gout, unspecified: Secondary | ICD-10-CM | POA: Diagnosis not present

## 2024-02-27 DIAGNOSIS — Z853 Personal history of malignant neoplasm of breast: Secondary | ICD-10-CM | POA: Insufficient documentation

## 2024-02-27 DIAGNOSIS — M48062 Spinal stenosis, lumbar region with neurogenic claudication: Secondary | ICD-10-CM | POA: Diagnosis not present

## 2024-02-27 DIAGNOSIS — C50919 Malignant neoplasm of unspecified site of unspecified female breast: Secondary | ICD-10-CM

## 2024-02-27 DIAGNOSIS — E66813 Obesity, class 3: Secondary | ICD-10-CM | POA: Insufficient documentation

## 2024-02-27 DIAGNOSIS — Z6841 Body Mass Index (BMI) 40.0 and over, adult: Secondary | ICD-10-CM | POA: Insufficient documentation

## 2024-02-27 DIAGNOSIS — F0390 Unspecified dementia without behavioral disturbance: Secondary | ICD-10-CM | POA: Insufficient documentation

## 2024-02-27 DIAGNOSIS — K2100 Gastro-esophageal reflux disease with esophagitis, without bleeding: Secondary | ICD-10-CM | POA: Insufficient documentation

## 2024-02-27 DIAGNOSIS — D509 Iron deficiency anemia, unspecified: Principal | ICD-10-CM | POA: Insufficient documentation

## 2024-02-27 DIAGNOSIS — Z79899 Other long term (current) drug therapy: Secondary | ICD-10-CM | POA: Insufficient documentation

## 2024-02-27 DIAGNOSIS — F419 Anxiety disorder, unspecified: Secondary | ICD-10-CM | POA: Diagnosis not present

## 2024-02-27 DIAGNOSIS — E1142 Type 2 diabetes mellitus with diabetic polyneuropathy: Secondary | ICD-10-CM

## 2024-02-27 DIAGNOSIS — N184 Chronic kidney disease, stage 4 (severe): Secondary | ICD-10-CM | POA: Diagnosis not present

## 2024-02-27 DIAGNOSIS — R1310 Dysphagia, unspecified: Secondary | ICD-10-CM | POA: Insufficient documentation

## 2024-02-27 DIAGNOSIS — J4489 Other specified chronic obstructive pulmonary disease: Secondary | ICD-10-CM | POA: Diagnosis not present

## 2024-02-27 DIAGNOSIS — R799 Abnormal finding of blood chemistry, unspecified: Secondary | ICD-10-CM | POA: Diagnosis present

## 2024-02-27 DIAGNOSIS — D649 Anemia, unspecified: Principal | ICD-10-CM

## 2024-02-27 DIAGNOSIS — E1140 Type 2 diabetes mellitus with diabetic neuropathy, unspecified: Secondary | ICD-10-CM | POA: Insufficient documentation

## 2024-02-27 DIAGNOSIS — I5032 Chronic diastolic (congestive) heart failure: Secondary | ICD-10-CM | POA: Insufficient documentation

## 2024-02-27 DIAGNOSIS — K449 Diaphragmatic hernia without obstruction or gangrene: Secondary | ICD-10-CM | POA: Insufficient documentation

## 2024-02-27 DIAGNOSIS — M1A9XX1 Chronic gout, unspecified, with tophus (tophi): Secondary | ICD-10-CM | POA: Diagnosis not present

## 2024-02-27 DIAGNOSIS — I2510 Atherosclerotic heart disease of native coronary artery without angina pectoris: Secondary | ICD-10-CM | POA: Insufficient documentation

## 2024-02-27 DIAGNOSIS — K582 Mixed irritable bowel syndrome: Secondary | ICD-10-CM

## 2024-02-27 DIAGNOSIS — D631 Anemia in chronic kidney disease: Secondary | ICD-10-CM | POA: Diagnosis not present

## 2024-02-27 DIAGNOSIS — E1122 Type 2 diabetes mellitus with diabetic chronic kidney disease: Secondary | ICD-10-CM | POA: Insufficient documentation

## 2024-02-27 DIAGNOSIS — Z8719 Personal history of other diseases of the digestive system: Secondary | ICD-10-CM

## 2024-02-27 DIAGNOSIS — F332 Major depressive disorder, recurrent severe without psychotic features: Secondary | ICD-10-CM | POA: Diagnosis not present

## 2024-02-27 DIAGNOSIS — I10 Essential (primary) hypertension: Secondary | ICD-10-CM

## 2024-02-27 DIAGNOSIS — G4709 Other insomnia: Secondary | ICD-10-CM

## 2024-02-27 DIAGNOSIS — F411 Generalized anxiety disorder: Secondary | ICD-10-CM | POA: Diagnosis not present

## 2024-02-27 DIAGNOSIS — Z86718 Personal history of other venous thrombosis and embolism: Secondary | ICD-10-CM | POA: Diagnosis not present

## 2024-02-27 DIAGNOSIS — Z794 Long term (current) use of insulin: Secondary | ICD-10-CM | POA: Diagnosis not present

## 2024-02-27 LAB — CBC
HCT: 26.8 % — ABNORMAL LOW (ref 36.0–46.0)
Hemoglobin: 8.3 g/dL — ABNORMAL LOW (ref 12.0–15.0)
MCH: 27.9 pg (ref 26.0–34.0)
MCHC: 31 g/dL (ref 30.0–36.0)
MCV: 89.9 fL (ref 80.0–100.0)
Platelets: 290 10*3/uL (ref 150–400)
RBC: 2.98 MIL/uL — ABNORMAL LOW (ref 3.87–5.11)
RDW: 15.5 % (ref 11.5–15.5)
WBC: 16.2 10*3/uL — ABNORMAL HIGH (ref 4.0–10.5)
nRBC: 0 % (ref 0.0–0.2)

## 2024-02-27 LAB — GLUCOSE, CAPILLARY: Glucose-Capillary: 183 mg/dL — ABNORMAL HIGH (ref 70–99)

## 2024-02-27 LAB — CBC WITH DIFFERENTIAL/PLATELET
Abs Immature Granulocytes: 0 K/uL (ref 0.00–0.07)
Basophils Absolute: 0.1 K/uL (ref 0.0–0.1)
Basophils Relative: 1 %
Eosinophils Absolute: 0.4 K/uL (ref 0.0–0.5)
Eosinophils Relative: 3 %
HCT: 20.5 % — ABNORMAL LOW (ref 36.0–46.0)
Hemoglobin: 5.9 g/dL — CL (ref 12.0–15.0)
Immature Granulocytes: 0 %
Lymphocytes Relative: 21 %
Lymphs Abs: 2.9 K/uL (ref 0.7–4.0)
MCH: 27.1 pg (ref 26.0–34.0)
MCHC: 28.8 g/dL — ABNORMAL LOW (ref 30.0–36.0)
MCV: 94 fL (ref 80.0–100.0)
Monocytes Absolute: 0.8 K/uL (ref 0.1–1.0)
Monocytes Relative: 5 %
Neutro Abs: 9.8 K/uL — ABNORMAL HIGH (ref 1.7–7.7)
Neutrophils Relative %: 70 %
Platelets: 352 K/uL (ref 150–400)
RBC: 2.18 MIL/uL — ABNORMAL LOW (ref 3.87–5.11)
RDW: 15.2 % (ref 11.5–15.5)
WBC: 13.9 K/uL — ABNORMAL HIGH (ref 4.0–10.5)

## 2024-02-27 LAB — COMPREHENSIVE METABOLIC PANEL WITH GFR
ALT: 8 U/L (ref 0–44)
AST: 12 U/L — ABNORMAL LOW (ref 15–41)
Albumin: 2.9 g/dL — ABNORMAL LOW (ref 3.5–5.0)
Alkaline Phosphatase: 75 U/L (ref 38–126)
Anion gap: 7 (ref 5–15)
BUN: 91 mg/dL — ABNORMAL HIGH (ref 8–23)
CO2: 21 mmol/L — ABNORMAL LOW (ref 22–32)
Calcium: 8.2 mg/dL — ABNORMAL LOW (ref 8.9–10.3)
Chloride: 106 mmol/L (ref 98–111)
Creatinine, Ser: 2.8 mg/dL — ABNORMAL HIGH (ref 0.44–1.00)
GFR, Estimated: 16 mL/min — ABNORMAL LOW (ref >60)
Glucose, Bld: 167 mg/dL — ABNORMAL HIGH (ref 70–99)
Potassium: 4.9 mmol/L (ref 3.5–5.1)
Sodium: 134 mmol/L — ABNORMAL LOW (ref 135–145)
Total Bilirubin: 0.4 mg/dL (ref 0.0–1.2)
Total Protein: 7 g/dL (ref 6.5–8.1)

## 2024-02-27 LAB — POC OCCULT BLOOD, ED: Fecal Occult Bld: NEGATIVE

## 2024-02-27 LAB — CBG MONITORING, ED: Glucose-Capillary: 143 mg/dL — ABNORMAL HIGH (ref 70–99)

## 2024-02-27 LAB — PREPARE RBC (CROSSMATCH)

## 2024-02-27 MED ORDER — ACETAMINOPHEN 650 MG RE SUPP: 650.0000 mg | Freq: Four times a day (QID) | RECTAL | Status: DC | PRN

## 2024-02-27 MED ORDER — ALBUTEROL SULFATE (2.5 MG/3ML) 0.083% IN NEBU: 2.5000 mg | INHALATION_SOLUTION | Freq: Four times a day (QID) | RESPIRATORY_TRACT | Status: DC | PRN

## 2024-02-27 MED ORDER — CALCITRIOL 0.25 MCG PO CAPS
0.2500 ug | ORAL_CAPSULE | Freq: Every day | ORAL | Status: DC
  Administered 2024-02-28 – 2024-02-29 (×2): 0.25 ug via ORAL
  Filled 2024-02-27 (×2): qty 1

## 2024-02-27 MED ORDER — ALBUTEROL SULFATE HFA 108 (90 BASE) MCG/ACT IN AERS
1.0000 | INHALATION_SPRAY | Freq: Four times a day (QID) | RESPIRATORY_TRACT | Status: DC | PRN
Start: 2024-02-27 — End: 2024-02-27

## 2024-02-27 MED ORDER — GABAPENTIN 100 MG PO CAPS
100.0000 mg | ORAL_CAPSULE | Freq: Every day | ORAL | Status: DC
  Administered 2024-02-27 – 2024-02-28 (×2): 100 mg via ORAL
  Filled 2024-02-27 (×2): qty 1

## 2024-02-27 MED ORDER — MELATONIN 5 MG PO TABS
5.0000 mg | ORAL_TABLET | Freq: Every evening | ORAL | Status: DC | PRN
  Administered 2024-02-29: 5 mg via ORAL
  Filled 2024-02-27 (×2): qty 1

## 2024-02-27 MED ORDER — UBROGEPANT 50 MG PO TABS: ORAL_TABLET | Freq: Two times a day (BID) | ORAL | Status: DC | PRN

## 2024-02-27 MED ORDER — MEMANTINE HCL 10 MG PO TABS: 5.0000 mg | ORAL_TABLET | Freq: Two times a day (BID) | ORAL | Status: DC

## 2024-02-27 MED ORDER — INSULIN ASPART 100 UNIT/ML IJ SOLN
0.0000 [IU] | Freq: Three times a day (TID) | INTRAMUSCULAR | Status: DC
  Administered 2024-02-28: 3 [IU] via SUBCUTANEOUS

## 2024-02-27 MED ORDER — SODIUM CHLORIDE 0.9% IV SOLUTION: Freq: Once | INTRAVENOUS | Status: AC

## 2024-02-27 MED ORDER — TORSEMIDE 20 MG PO TABS
20.0000 mg | ORAL_TABLET | Freq: Every day | ORAL | Status: DC
  Administered 2024-02-28: 20 mg via ORAL
  Filled 2024-02-27: qty 1

## 2024-02-27 MED ORDER — POLYETHYLENE GLYCOL 3350 17 G PO PACK: 17.0000 g | PACK | Freq: Every day | ORAL | Status: DC | PRN

## 2024-02-27 MED ORDER — PANTOPRAZOLE SODIUM 40 MG IV SOLR
40.0000 mg | Freq: Two times a day (BID) | INTRAVENOUS | Status: DC
  Administered 2024-02-27 – 2024-02-29 (×5): 40 mg via INTRAVENOUS
  Filled 2024-02-27 (×5): qty 10

## 2024-02-27 MED ORDER — MEMANTINE HCL 5 MG PO TABS
5.0000 mg | ORAL_TABLET | Freq: Every evening | ORAL | Status: DC
  Administered 2024-02-28 – 2024-02-29 (×2): 5 mg via ORAL
  Filled 2024-02-27 (×2): qty 1

## 2024-02-27 MED ORDER — MEMANTINE HCL 10 MG PO TABS
10.0000 mg | ORAL_TABLET | Freq: Every morning | ORAL | Status: DC
Start: 2024-02-28 — End: 2024-03-01
  Administered 2024-02-28 – 2024-02-29 (×2): 10 mg via ORAL
  Filled 2024-02-27 (×2): qty 1

## 2024-02-27 MED ORDER — ACETAMINOPHEN 325 MG PO TABS
650.0000 mg | ORAL_TABLET | Freq: Four times a day (QID) | ORAL | Status: DC | PRN
Start: 2024-02-27 — End: 2024-03-01
  Administered 2024-02-28 – 2024-02-29 (×2): 650 mg via ORAL
  Filled 2024-02-27 (×2): qty 2

## 2024-02-27 MED ORDER — ATORVASTATIN CALCIUM 80 MG PO TABS
80.0000 mg | ORAL_TABLET | Freq: Every day | ORAL | Status: DC
  Administered 2024-02-28 – 2024-02-29 (×2): 80 mg via ORAL
  Filled 2024-02-27 (×2): qty 1

## 2024-02-27 MED ORDER — INSULIN GLARGINE-YFGN 100 UNIT/ML ~~LOC~~ SOLN
20.0000 [IU] | Freq: Every day | SUBCUTANEOUS | Status: DC
  Administered 2024-02-27 – 2024-02-28 (×2): 20 [IU] via SUBCUTANEOUS
  Filled 2024-02-27 (×2): qty 0.2

## 2024-02-27 MED ORDER — AMITRIPTYLINE HCL 50 MG PO TABS
75.0000 mg | ORAL_TABLET | Freq: Every day | ORAL | Status: DC
  Administered 2024-02-27 – 2024-02-28 (×2): 75 mg via ORAL
  Filled 2024-02-27 (×2): qty 2

## 2024-02-27 MED ORDER — FEBUXOSTAT 40 MG PO TABS
40.0000 mg | ORAL_TABLET | Freq: Every day | ORAL | Status: DC
  Administered 2024-02-28 – 2024-02-29 (×2): 40 mg via ORAL
  Filled 2024-02-27 (×2): qty 1

## 2024-02-27 MED ORDER — ACETAMINOPHEN 500 MG PO TABS
1000.0000 mg | ORAL_TABLET | Freq: Once | ORAL | Status: AC
  Administered 2024-02-27: 1000 mg via ORAL
  Filled 2024-02-27: qty 2

## 2024-02-27 MED ORDER — SODIUM CHLORIDE 0.9% FLUSH
3.0000 mL | Freq: Two times a day (BID) | INTRAVENOUS | Status: DC
  Administered 2024-02-27 – 2024-02-29 (×4): 3 mL via INTRAVENOUS

## 2024-02-27 NOTE — ED Notes (Signed)
CBG 143  

## 2024-02-27 NOTE — ED Notes (Signed)
 FAMILY REPORTS PT USES [facility].

## 2024-02-27 NOTE — ED Notes (Signed)
 Assisted pt onto bed pan for one urine occurrence then adjusted pt in bed

## 2024-02-27 NOTE — ED Notes (Signed)
 MD at bedside.

## 2024-02-27 NOTE — ED Triage Notes (Addendum)
 Pt sent by her Kidney doctor for low hgb. Pt reports headache and generally not feeling well.

## 2024-02-27 NOTE — ED Notes (Signed)
 Remained with pt for 15 minutes post transfusion. No signs or symptoms of transfusion reaction observed. VSS see flowsheet for vitals.

## 2024-02-27 NOTE — ED Notes (Signed)
 Extra Blue & DG tube drawn

## 2024-02-27 NOTE — ED Notes (Signed)
 Pt blood consent in chart, agrees to risks and benefits as explained by provider. See flowsheet for pre transfusion vitals. Dual chamber Y set filter used.

## 2024-02-27 NOTE — ED Triage Notes (Signed)
 Pts kidney doctor sent her over for low hemoglobin

## 2024-02-27 NOTE — ED Notes (Signed)
 Family at bedside.

## 2024-02-27 NOTE — Progress Notes (Signed)
 Patient arrived to the floor at approximately 1852 in stable condition. Vital signs obtained.  Denies pain and discomfort. Call bell within reach. Bed in lowest position.

## 2024-02-27 NOTE — H&P (Signed)
 History and Physical   [name] FMW:[id number] DOB: 12/10/1934 DOA: 02/27/2024  PCP: Sebastian Beverley NOVAK, MD   Patient coming from: Home  Chief Complaint: Abnormal labs  HPI: [name] is a 89 y.o. female with medical history significant of hypertension, hyperlipidemia, diabetes, CKD 4, anemia, hiatal hernia, GERD, gout, diastolic CHF, CAD, migraine, dementia, depression, anxiety, neuropathy, GI bleed, VTE, spinal stenosis, pain, COPD, obesity, breast cancer presenting with abnormal labs.  History obtained with assistance of family in the setting of patient's baseline dementia.  Patient had labs checked yesterday and noted to have worsening hemoglobin and was sent to the ED for further evaluation.  Does report some increased fatigue and dyspnea on exertion.  Recent oncology notes outpatient indicates that patient has history of anemia of chronic disease with some iron  deficiency anemia.  There is suspicion for some slow GI losses in the setting of more rapid decline in hemoglobin.  Baseline hemoglobin is around 8.  Did have labs checked on 8/22 showing normal iron , normal B12, normal folate, hemoglobin 7.7.  Ferritin not checked at that time.  It appears last endoscopy was in 2024 when patient presented for GI bleed and EGD was unremarkable.  Patient and family report dark stools for the past month or so.  Patient is intermittently darker/more black in color and at times does have more of a brown pigment.  Does have some right sided abdominal pain with eating.  Denies fevers, chills, chest pain, constipation, diarrhea, nausea, vomiting.  ED Course: Vital signs in the ED notable for blood pressure with 1 reading in the 70s systolic which may have been spurious otherwise blood pressure stable in the 90s-100 systolic.  Lab workup notable for CMP with sodium 134, bicarb 21, gap 16, BUN 91 which is stable, creatinine stable at 2.8, glucose 167, calcium  8.2, albumin  2.9.  CBC with  hemoglobin 5.9, leukocytosis to 13.9.  Type and screen pending.  Patient has been ordered 2 units for transfusion and Tylenol .  No consults in the ED.  Hospitalist service asked to admit for further evaluation and monitoring overnight of patient's symptomatic anemia and ?GI bleed.   Review of Systems: As per HPI otherwise all other systems reviewed and are negative.  Past Medical History:  Diagnosis Date   Abdominal pain 01/26/2012   Acute deep vein thrombosis (DVT) of femoral vein of right lower extremity (HCC) 03/26/2022   Acute kidney injury superimposed on chronic kidney disease 12/18/2014   Acute upper GI bleed 03/26/2022   Allergy     takes Mucinex  daily as needed   Anemia, unspecified    Anxiety    takes Clonazepam  daily as needed   Anxiety associated with depression 06/08/2017   Arthritis    Breast cancer (HCC)    Breast cancer screening 02/19/2014   Breast mass 10/27/2014   Chronic diastolic CHF (congestive heart failure) (HCC)    takes Furosemide  daily   Chronic pain syndrome 02/06/2015   CKD (chronic kidney disease), stage III (HCC)    Coronary artery disease    a. s/p IMI 2004 tx with BMS to RCA;  b. s/p Promus DES to RCA 2/12 c. abnormal nuc 2016 -> cath with med rx. d. NSTEMI 02/2020  mv CAD with severe ISR in pRCA and m/dRCA lesion both treated with DES.   Debility 08/01/2012   Dementia (HCC)    Depression    takes Cymbalta  daily   Diabetes mellitus    insulin  daily   DOE (dyspnea on  exertion) 06/24/2014   Dysphagia, unspecified(787.20) 07/31/2012   Fungal dermatitis 11/13/2007   Qualifier: Diagnosis of  By: Jame  MD, Maude FALCON    GERD (gastroesophageal reflux disease)    takes Protonix  daily   Gout, unspecified    Headache    occasionally   History of anemia due to chronic kidney disease 03/26/2022   History of blood clots    History of deep venous thrombosis 01/27/2012   History of pulmonary embolus (PE) 04/22/2014   Hyperkalemia 07/26/2017    Hyperlipidemia    takes Pravastatin  daily   Hypertension    Insomnia    Joint pain    Joint swelling    Morbid obesity (HCC)    Multiple benign nevi 11/15/2016   Myocardial infarction [facility]) 2004   Non-STEMI (non-ST elevated myocardial infarction) (HCC) 02/15/2020   Nonintractable headache 01/07/2008   Qualifier: Diagnosis of   By: Jame  MD, Maude FALCON        Numbness    Obstructive sleep apnea    does not wear cpap   Osteoarthritis    Osteoarthritis    Osteoarthrosis, unspecified whether generalized or localized, lower leg    Pain in the chest 12/31/2013   Pain, chronic    Personal history of other diseases of the digestive system 12/03/2006   Qualifier: Diagnosis of  By: Jame  MD, Maude FALCON    Polymyalgia rheumatica    Presence of stent in right coronary artery    Pulmonary emboli (HCC) 01/2012   felt to need lifelong anticoagulation but Xarelto  dc in 02/2020 due to need for DAPT   Situational depression 06/04/2009   Qualifier: Diagnosis of  By: Jame  MD, Maude FALCON    Syncope, vasovagal 07/04/2021   Type 2 diabetes mellitus with hyperlipidemia (HCC) 02/16/2022   Urinary incontinence    takes Linzess  daily   Vaginitis and vulvovaginitis 06/23/2016   Vertigo    hx of;was taking Meclizine  if needed   Wears dentures     Past Surgical History:  Procedure Laterality Date   ABDOMINAL HYSTERECTOMY     partial   APPENDECTOMY     blood clots/legs and lungs  2013   BREAST BIOPSY Left 07/22/2014   BREAST BIOPSY Left 02/10/2013   BREAST LUMPECTOMY Left 11/05/2014   BREAST LUMPECTOMY WITH RADIOACTIVE SEED LOCALIZATION Left 11/05/2014   Procedure: LEFT BREAST LUMPECTOMY WITH RADIOACTIVE SEED LOCALIZATION;  Surgeon: Vicenta Poli, MD;  Location: [facility] OR;  Service: General;  Laterality: Left;   CARDIAC CATHETERIZATION     COLONOSCOPY     CORONARY ANGIOPLASTY  2   CORONARY STENT INTERVENTION N/A 02/17/2020   Procedure: CORONARY STENT INTERVENTION;  Surgeon: Anner Alm ORN, MD;  Location: [facility] INVASIVE CV LAB;  Service: Cardiovascular;  Laterality: N/A;   ESOPHAGOGASTRODUODENOSCOPY (EGD) WITH PROPOFOL  N/A 11/07/2016   Procedure: ESOPHAGOGASTRODUODENOSCOPY (EGD) WITH PROPOFOL ;  Surgeon: Avram Lupita BRAVO, MD;  Location: [facility] ENDOSCOPY;  Service: Endoscopy;  Laterality: N/A;   ESOPHAGOGASTRODUODENOSCOPY (EGD) WITH PROPOFOL  N/A 01/05/2023   Procedure: ESOPHAGOGASTRODUODENOSCOPY (EGD) WITH PROPOFOL ;  Surgeon: Abran Norleen SAILOR, MD;  Location: [facility] ENDOSCOPY;  Service: Gastroenterology;  Laterality: N/A;   EXCISION OF SKIN TAG Right 11/05/2014   Procedure: EXCISION OF RIGHT EYELID SKIN TAG;  Surgeon: Vicenta Poli, MD;  Location: [facility] OR;  Service: General;  Laterality: Right;   EYE SURGERY Bilateral    cataract    GASTRIC BYPASS  1977    reversed in 1979, [facility]   LEFT HEART CATH AND CORONARY  ANGIOGRAPHY N/A 02/17/2020   Procedure: LEFT HEART CATH AND CORONARY ANGIOGRAPHY;  Surgeon: Anner Alm ORN, MD;  Location: [facility] INVASIVE CV LAB;  Service: Cardiovascular;  Laterality: N/A;   LEFT HEART CATHETERIZATION WITH CORONARY ANGIOGRAM N/A 06/29/2014   Procedure: LEFT HEART CATHETERIZATION WITH CORONARY ANGIOGRAM;  Surgeon: Debby DELENA Sor, MD;  Location: [facility] CATH LAB;  Service: Cardiovascular;  Laterality: N/A;   MEMBRANE PEEL Right 10/23/2018   Procedure: MEMBRANE PEEL;  Surgeon: Elner Arley DELENA, MD;  Location: [facility] OR;  Service: Ophthalmology;  Laterality: Right;   MI with stent placement  2004   PARS PLANA VITRECTOMY Right 10/23/2018   Procedure: PARS PLANA VITRECTOMY WITH 25 GAUGE;  Surgeon: Elner Arley DELENA, MD;  Location: [facility] OR;  Service: Ophthalmology;  Laterality: Right;    Social History  reports that she has never smoked. She has been exposed to tobacco smoke. She has never used smokeless tobacco. She reports that she does not drink alcohol and does not use drugs.  Allergies  Allergen Reactions   Sulfonamide Derivatives Anaphylaxis, Swelling and Other (See Comments)     Mouth swelling- no resp issues noted    Other Reaction: MOUTH SWELLS  Substance with sulfonamide structure and antibacterial mechanism of action (substance)   Duloxetine      Headaches   Lokelma  [Sodium Zirconium Cyclosilicate ] Other (See Comments)    Headache, stomach upset   Sulfamethoxazole-Trimethoprim Other (See Comments)   Aricept  [Donepezil  Hcl] Diarrhea, Nausea And Vomiting and Other (See Comments)    GI upset/loose stools   Tramadol  Nausea And Vomiting    Family History  Problem Relation Age of Onset   Breast cancer Mother 36   Heart disease Mother    Throat cancer Father    Hypertension Father    Arthritis Father    Diabetes Father    Arthritis Sister    Obesity Sister    Diabetes Sister    Heart disease Cousin    Colon cancer Neg Hx    Stomach cancer Neg Hx    Esophageal cancer Neg Hx   Reviewed on admission  Prior to Admission medications   Medication Sig Start Date End Date Taking? Authorizing Provider  albuterol  (PROAIR  HFA) 108 (90 Base) MCG/ACT inhaler Inhale 1-2 puffs into the lungs every 6 (six) hours as needed for wheezing or shortness of breath. 12/19/18   Caro Harlene POUR, NP  amitriptyline  (ELAVIL ) 75 MG tablet Take 1 tablet (75 mg total) by mouth at bedtime. 02/20/24 02/14/25  Sebastian Beverley NOVAK, MD  amoxicillin -clavulanate (AUGMENTIN ) 875-125 MG tablet Take 1 tablet by mouth 2 (two) times daily. 02/20/24   Sebastian Beverley NOVAK, MD  atorvastatin  (LIPITOR ) 80 MG tablet TAKE 1 TABLET BY MOUTH EVERY DAY 11/01/23   Sebastian Beverley NOVAK, MD  azelastine  (ASTELIN ) 0.1 % nasal spray Place 2 sprays into both nostrils 2 (two) times daily. 10/09/23   Sebastian Beverley NOVAK, MD  calcitRIOL  (ROCALTROL ) 0.25 MCG capsule Take 0.25 mcg by mouth daily. 10/02/23   [provider]  carvedilol  (COREG ) 12.5 MG tablet TAKE 1 TABLET BY MOUTH 2 TIMES DAILY 11/01/23   Sebastian Beverley NOVAK, MD  cetirizine  (ZYRTEC ) 10 MG tablet Take 10 mg by mouth daily as needed for allergies or  rhinitis.    [provider]  Continuous Glucose Receiver (FREESTYLE LIBRE 3 READER) DEVI Continuous glucose monitor 06/21/23   Shamleffer, Donell Cardinal, MD  Continuous Glucose Sensor (FREESTYLE LIBRE 3 PLUS SENSOR) MISC 1 Device by Other route every 14 (fourteen) days. Change  sensor every 15 days. 12/12/23   Shamleffer, Ibtehal Jaralla, MD  diclofenac  Sodium (VOLTAREN ) 1 % GEL Apply 2 g topically 4 (four) times daily. 02/23/22   Sebastian Beverley NOVAK, MD  ELIQUIS  5 MG TABS tablet TAKE 1 TABLET BY MOUTH 2 TIMES DAILY 02/07/24   Sebastian Beverley NOVAK, MD  febuxostat  (ULORIC ) 40 MG tablet TAKE 1 TABLET BY MOUTH EVERY DAY 12/24/23   Sebastian Beverley NOVAK, MD  fluticasone  (FLONASE ) 50 MCG/ACT nasal spray Place 2 sprays into both nostrils daily. 12/06/23   Berneta Elsie Sayre, MD  folic acid  (FOLVITE ) 1 MG tablet TAKE 1 TABLET BY MOUTH EVERY DAY 11/01/23   Sebastian Beverley NOVAK, MD  gabapentin  (NEURONTIN ) 100 MG capsule Take 1 capsule (100 mg total) by mouth at bedtime. 02/20/24   Sebastian Beverley NOVAK, MD  glucagon  1 MG injection Inject 1 mg into the vein once as needed for up to 1 dose. 04/13/22   Sebastian Beverley NOVAK, MD  glucose 4 GM chewable tablet Chew 1 tablet (4 g total) by mouth as needed for low blood sugar. 04/05/22   Sebastian Beverley NOVAK, MD  hydrOXYzine  (ATARAX ) 25 MG tablet TAKE 1 TABLET BY MOUTH EVERY 8 HOURS AS NEEDED FOR ANXIETY OR ITCHING (NASAL AND THROAT CONGESTION) 06/05/23   Sebastian Beverley NOVAK, MD  insulin  glargine, 2 Unit Dial , (TOUJEO  MAX SOLOSTAR) 300 UNIT/ML Solostar Pen Inject 40 units subcutaneously one daily 12/13/23   Shamleffer, Ibtehal Jaralla, MD  ipratropium-albuterol  (DUONEB) 0.5-2.5 (3) MG/3ML SOLN Take 3 mLs by nebulization every 6 (six) hours as needed (cough, wheezing, or shortness of breath). 10/09/23 01/16/24  Sebastian Beverley NOVAK, MD  linaclotide  (LINZESS ) 72 MCG capsule Take 1 capsule (72 mcg total) by mouth daily before breakfast. 02/20/24   Sebastian Beverley NOVAK, MD  meclizine  (ANTIVERT ) 25 MG  tablet Take 1 tablet (25 mg total) by mouth 2 (two) times daily. 12/06/23   Berneta Elsie Sayre, MD  melatonin 5 MG TABS Take 1 tablet (5 mg total) by mouth at bedtime as needed. 12/06/23   Berneta Elsie Sayre, MD  memantine  (NAMENDA ) 5 MG tablet TAKE 2 TABLETS BY MOUTH EVERY MORNING and TAKE 1 TABLET EVERY EVENING 10/11/23   Sebastian Beverley NOVAK, MD  montelukast  (SINGULAIR ) 10 MG tablet Take 1 tablet (10 mg total) by mouth at bedtime. 04/24/22   Sebastian Beverley NOVAK, MD  mupirocin ointment (BACTROBAN) 2 % Apply 1 Application topically. 12/19/23   [provider]  nitroGLYCERIN  (NITROSTAT ) 0.4 MG SL tablet Dissolve 1 tablet under the tongue every 5 minutes as needed for chest pain. Max of 3 doses, then 911. 09/20/23   Nahser, Aleene PARAS, MD  NOVOLOG  FLEXPEN 100 UNIT/ML FlexPen INJECT 14 UNITS SUBCUTANEOUSLY TWICE DAILY WITH A MEAL . DO NOT EXCEED 70 PER 24 HOURS 11/02/23   Shamleffer, Donell Cardinal, MD  nystatin  (MYCOSTATIN /NYSTOP ) powder Apply 1 Application topically 3 (three) times daily. 03/21/23   Billy Knee, FNP  ondansetron  (ZOFRAN ) 4 MG tablet TAKE 1 TABLET BY MOUTH EVERY 8 HOURS AS NEEDED FOR NAUSEA FOR VOMITING    [provider]  ondansetron  (ZOFRAN -ODT) 4 MG disintegrating tablet Take 1 tablet (4 mg total) by mouth every 8 (eight) hours as needed for nausea or vomiting. 11/28/23   Curatolo, Adam, DO  oxyCODONE -acetaminophen  (PERCOCET) 7.5-325 MG tablet Take 1 tablet by mouth every 12 (twelve) hours.    [provider]  pantoprazole  (PROTONIX ) 20 MG tablet TAKE 1 TABLET BY MOUTH ONCE DAILY 10/05/23   Sebastian Beverley NOVAK, MD  ranolazine  (RANEXA ) 500 MG 12 hr tablet Take 1 tablet (500 mg total) by mouth 2 (two) times daily. 10/13/23   Fairy Frames, MD  Semaglutide  (RYBELSUS ) 7 MG TABS Take 1 tablet (7 mg total) by mouth daily. 10/09/23   Sebastian Beverley NOVAK, MD  sodium zirconium cyclosilicate  (LOKELMA ) 10 g PACK packet USE 1 PACKET BY MOUTH AS DIRECTED DAILY    [provider]  torsemide  (DEMADEX ) 20 MG tablet TAKE 1 TABLET BY MOUTH EVERY DAY TAKE AN EXTRA DOSE FOR WEIGHT GAIN OF 3LBs IN 1 DAY OR 5LBS IN 1 WEEK OR FOR WORSENING SWELLING 02/07/24   Sebastian Beverley NOVAK, MD  traMADol  (ULTRAM ) 50 MG tablet TAKE 1 TABLET BY MOUTH EVERY 12 HOURS AS NEEDED TAKE THE FIRST 4 DOSES WITH ZOFRAN  TO MINIMIZE NAUSEA    [provider]  TYLENOL  500 MG tablet Take 1,000 mg by mouth every 6 (six) hours as needed for mild pain or headache.    [provider]  TYLENOL  PM EXTRA STRENGTH 500-25 MG TABS tablet Take 2 tablets by mouth at bedtime.    [provider]  Ubrogepant  (UBRELVY ) 50 MG TABS Take 1 tab at onset of migraine.  May repeat in 2 hrs, if needed.  Max dose: 2 tabs/day. This is a 30 day prescription. 01/15/24   Onita Duos, MD  Vitamin D , Ergocalciferol , (DRISDOL ) 1.25 MG (50000 UNIT) CAPS capsule TAKE 1 CAPSULE BY MOUTH ONCE WEEKLY ON MONDAY 01/10/24   Onesimo Emaline Brink, MD    Physical Exam: Vitals:   02/27/24 1300 02/27/24 1345 02/27/24 1349 02/27/24 1359  BP: (!) 109/50 104/66 (!) 93/56 (!) 100/56  Pulse: 66 69  71  Resp: 18  14 16   Temp:  97.8 F (36.6 C)  97.6 F (36.4 C)  TempSrc:      SpO2: 97% 100%  100%  Weight:      Height:        Physical Exam Constitutional:      General: She is not in acute distress.    Appearance: Normal appearance. She is obese.  HENT:     Head: Normocephalic and atraumatic.     Mouth/Throat:     Mouth: Mucous membranes are moist.     Pharynx: Oropharynx is clear.  Eyes:     Extraocular Movements: Extraocular movements intact.     Pupils: Pupils are equal, round, and reactive to light.  Cardiovascular:     Rate and Rhythm: Normal rate and regular rhythm.     Pulses: Normal pulses.     Heart sounds: Normal heart sounds.  Pulmonary:     Effort: Pulmonary effort is normal. No respiratory distress.     Breath sounds: Normal breath sounds.  Abdominal:     General: Bowel sounds are normal.  There is no distension.     Palpations: Abdomen is soft.     Tenderness: There is no abdominal tenderness.  Musculoskeletal:        General: No swelling or deformity.  Skin:    General: Skin is warm and dry.  Neurological:     General: No focal deficit present.     Mental Status: Mental status is at baseline.    Labs on Admission: I have personally reviewed following labs and imaging studies  CBC: Recent Labs  Lab 02/27/24 1109  WBC 13.9*  NEUTROABS 9.8*  HGB 5.9*  HCT 20.5*  MCV 94.0  PLT 352    Basic Metabolic Panel: Recent Labs  Lab 02/27/24  1109  NA 134*  K 4.9  CL 106  CO2 21*  GLUCOSE 167*  BUN 91*  CREATININE 2.80*  CALCIUM  8.2*    GFR: Estimated Creatinine Clearance: 19.4 mL/min (A) (by C-G formula based on SCr of 2.8 mg/dL (H)).  Liver Function Tests: Recent Labs  Lab 02/27/24 1109  AST 12*  ALT 8  ALKPHOS 75  BILITOT 0.4  PROT 7.0  ALBUMIN  2.9*    Urine analysis:    Component Value Date/Time   COLORURINE STRAW (A) 11/28/2023 1450   APPEARANCEUR CLEAR 11/28/2023 1450   LABSPEC 1.006 11/28/2023 1450   PHURINE 5.0 11/28/2023 1450   GLUCOSEU NEGATIVE 11/28/2023 1450   HGBUR NEGATIVE 11/28/2023 1450   BILIRUBINUR NEGATIVE 11/28/2023 1450   BILIRUBINUR neg 10/04/2023 1117   KETONESUR NEGATIVE 11/28/2023 1450   PROTEINUR NEGATIVE 11/28/2023 1450   UROBILINOGEN 0.2 10/04/2023 1117   UROBILINOGEN 0.2 02/16/2018 1514   NITRITE NEGATIVE 11/28/2023 1450   LEUKOCYTESUR NEGATIVE 11/28/2023 1450    Radiological Exams on Admission: No results found.  EKG: Independently reviewed.  Sinus rhythm at 60 beats minute.  Nonspecific T wave changes.  PVC noted.  Assessment/Plan Active Problems:   Essential hypertension   Chronic diastolic CHF (congestive heart failure) (HCC)   COPD with chronic bronchitis (HCC)   Dementia (HCC)   Breast cancer (HCC)   GAD (generalized anxiety disorder)   Class 3 obesity (HCC)   Hyperlipidemia   History of DVT  (deep vein thrombosis)   Hiatal hernia with GERD and esophagitis   Gout   Spinal stenosis, lumbar region, with neurogenic claudication   Iron  deficiency anemia   Severe episode of recurrent major depressive disorder, without psychotic features (HCC)   CKD (chronic kidney disease), stage IV (HCC)   Type 2 diabetes mellitus with stage 4 chronic kidney disease, with long-term current use of insulin  (HCC)   Diabetic peripheral neuropathy associated with type 2 diabetes mellitus (HCC)   CAD (coronary artery disease)   Symptomatic acute on chronic anemia Rule out GI bleed > As per HPI patient sent with worsening hemoglobin on outpatient labs.  Also reporting fatigue and dyspnea on exertion. > Recent hematology notes reviewed.  Patient has history of anemia of chronic disease and iron  deficiency anemia.  Recent iron  studies were normal though ferritin was not checked.  There is concern that with more rapid decline in hemoglobin she may have GI bleeding component.  Does have history of GI bleed previously. > Does report dark/tarry stools, will check FOBT.  Suspect intermittent bleeding due to the somewhat gradual decline in hemoglobin over the past month. > Blood pressure largely stable in the ED in the 90s-100 systolic.  A couple of readings in the 70s systolic but these appear to have been spurious.  Will monitor. > 2 units ordered for transfusion in the ED. - Monitor on telemetry overnight - Continue with 2 units PRBC transfusion - Trend hemoglobin - FOBT, GI consult if positive - Hold Eliquis  - IV PPI - Full liquid diet  Leukocytosis > Presumed reactive at this time.  No source of infection.  No respiratory symptoms, no urinary symptoms.  Does have some intermittent abdominal pain but this has been ongoing and is not persistent. - Continue to trend for now  Dysphagia > Reports some difficulty swallowing for the past month or 2. - SLP eval and treat  History of VTE > DVT in 2013 and  2023.  PE in 2015.  On Eliquis . - Holding Eliquis   in the setting of above  Breast cancer > Status post lobectomy.  Known residual DCIS. - Continues to follow with oncology  Hypertension - Low-normal BP in the ED. - Hold Coreg  for now  Hyperlipidemia - Continue home atorvastatin   Diabetes > 40 units daily outpatient. - 20 units daily - SSI   CKD 4 > Creatinine stable at 2.8.  BUN relatively stable at 91, bicarb 21, gap 16. -Trend renal function and electrolytes - Continue home calcitriol  - Continue home torsemide   Hiatal hernia GERD - IV PPI as above  Gout - Continue Oxistat  Chronic diastolic CHF > Last echo was in 2023 with EF 55-60%, G1 DD, normal RV function. - Continue with torsemide  for now in the setting of receiving transfusions this evening.  Hold if blood pressure does not improve, though blood pressure readings from the ED appear to been taken at her wrist and will be less accurate. - Holding home Coreg  as above  Dementia - Continue home Namenda   Depression Anxiety - Continue home amitriptyline   Neuropathy - Continue home gabapentin  - On amitriptyline  as above  Obesity - Noted  COPD - Continue home albuterol   CAD - Continue home atorvastatin  - Holding Coreg  as above  DVT prophylaxis: SCDs Code Status:   DNR/DNI  Family Communication:  Updated at bedside Disposition Plan:   Patient is from:  Home  Anticipated DC to:  Home  Anticipated DC date:  1 to 3 days  Anticipated DC barriers: None  Consults called:  None for now Admission status:  Observation, telemetry  Severity of Illness: The appropriate patient status for this patient is OBSERVATION. Observation status is judged to be reasonable and necessary in order to provide the required intensity of service to ensure the patient's safety. [name] presenting symptoms, physical exam findings, and initial radiographic and laboratory data in the context of their medical condition is felt  to place them at decreased risk for further clinical deterioration. Furthermore, it is anticipated that the patient will be medically stable for discharge from the hospital within 2 midnights of admission.    Marsa KATHEE Scurry MD [facility]  How to contact the [facility] Attending or Consulting provider 7A - 7P or covering provider during after hours 7P -7A, for this patient?   Check the care team in [facility] and look for a) attending/consulting [facility] provider listed and b) the [facility] team listed Log into [email or web address] and use [facility]'s universal password to access. If you do not have the password, please contact the hospital operator. Locate the [facility] provider you are looking for under [facility] and page to a number that you can be directly reached. If you still have difficulty reaching the provider, please page the [facility] (Director on Call) for the Hospitalists listed on amion for assistance.  02/27/2024, 2:09 PM

## 2024-02-27 NOTE — ED Notes (Signed)
 Pt and all belongings transported upstairs with NT

## 2024-02-27 NOTE — ED Notes (Signed)
 MD aware of BP pt mental status remains same as arrival family at bedside

## 2024-02-27 NOTE — ED Notes (Signed)
Meds not verified at this time

## 2024-02-27 NOTE — ED Provider Notes (Signed)
 [place] EMERGENCY DEPARTMENT AT [facility] Provider Note   CSN: [id number] Arrival date & time: 02/27/24  1035     Patient presents with: Abnormal Labs   [name] is a 89 y.o. female.   HPI Patient with multiple medical problems presents with her daughters who assist with the history. History includes breast cancer, CKD, iron  deficiency anemia requiring frequent transfusions.  She presents with fatigue, worse with exertion and decreased exercise capacity. Reportedly she had blood draw performed yesterday, today was informed of anemia and sent for evaluation.  No chest pain, no current dyspnea at rest.  She does have mild headache.    Prior to Admission medications   Medication Sig Start Date End Date Taking? Authorizing Provider  albuterol  (PROAIR  HFA) 108 (90 Base) MCG/ACT inhaler Inhale 1-2 puffs into the lungs every 6 (six) hours as needed for wheezing or shortness of breath. 12/19/18   Caro Harlene POUR, NP  amitriptyline  (ELAVIL ) 75 MG tablet Take 1 tablet (75 mg total) by mouth at bedtime. 02/20/24 02/14/25  Sebastian Beverley NOVAK, MD  amoxicillin -clavulanate (AUGMENTIN ) 875-125 MG tablet Take 1 tablet by mouth 2 (two) times daily. 02/20/24   Sebastian Beverley NOVAK, MD  atorvastatin  (LIPITOR ) 80 MG tablet TAKE 1 TABLET BY MOUTH EVERY DAY 11/01/23   Sebastian Beverley NOVAK, MD  azelastine  (ASTELIN ) 0.1 % nasal spray Place 2 sprays into both nostrils 2 (two) times daily. 10/09/23   Sebastian Beverley NOVAK, MD  calcitRIOL  (ROCALTROL ) 0.25 MCG capsule Take 0.25 mcg by mouth daily. 10/02/23   [provider]  carvedilol  (COREG ) 12.5 MG tablet TAKE 1 TABLET BY MOUTH 2 TIMES DAILY 11/01/23   Sebastian Beverley NOVAK, MD  cetirizine  (ZYRTEC ) 10 MG tablet Take 10 mg by mouth daily as needed for allergies or rhinitis.    [provider]  Continuous Glucose Receiver (FREESTYLE LIBRE 3 READER) DEVI Continuous glucose monitor 06/21/23   Shamleffer, Donell Cardinal, MD  Continuous Glucose  Sensor (FREESTYLE LIBRE 3 PLUS SENSOR) MISC 1 Device by Other route every 14 (fourteen) days. Change sensor every 15 days. 12/12/23   Shamleffer, Ibtehal Jaralla, MD  diclofenac  Sodium (VOLTAREN ) 1 % GEL Apply 2 g topically 4 (four) times daily. 02/23/22   Sebastian Beverley NOVAK, MD  ELIQUIS  5 MG TABS tablet TAKE 1 TABLET BY MOUTH 2 TIMES DAILY 02/07/24   Sebastian Beverley NOVAK, MD  febuxostat  (ULORIC ) 40 MG tablet TAKE 1 TABLET BY MOUTH EVERY DAY 12/24/23   Sebastian Beverley NOVAK, MD  fluticasone  (FLONASE ) 50 MCG/ACT nasal spray Place 2 sprays into both nostrils daily. 12/06/23   Berneta Elsie Sayre, MD  folic acid  (FOLVITE ) 1 MG tablet TAKE 1 TABLET BY MOUTH EVERY DAY 11/01/23   Sebastian Beverley NOVAK, MD  gabapentin  (NEURONTIN ) 100 MG capsule Take 1 capsule (100 mg total) by mouth at bedtime. 02/20/24   Sebastian Beverley NOVAK, MD  glucagon  1 MG injection Inject 1 mg into the vein once as needed for up to 1 dose. 04/13/22   Sebastian Beverley NOVAK, MD  glucose 4 GM chewable tablet Chew 1 tablet (4 g total) by mouth as needed for low blood sugar. 04/05/22   Sebastian Beverley NOVAK, MD  hydrOXYzine  (ATARAX ) 25 MG tablet TAKE 1 TABLET BY MOUTH EVERY 8 HOURS AS NEEDED FOR ANXIETY OR ITCHING (NASAL AND THROAT CONGESTION) 06/05/23   Sebastian Beverley NOVAK, MD  insulin  glargine, 2 Unit Dial , (TOUJEO  MAX SOLOSTAR) 300 UNIT/ML Solostar Pen Inject 40 units subcutaneously one daily 12/13/23  Shamleffer, Donell Cardinal, MD  ipratropium-albuterol  (DUONEB) 0.5-2.5 (3) MG/3ML SOLN Take 3 mLs by nebulization every 6 (six) hours as needed (cough, wheezing, or shortness of breath). 10/09/23 01/16/24  Sebastian Beverley NOVAK, MD  linaclotide  (LINZESS ) 72 MCG capsule Take 1 capsule (72 mcg total) by mouth daily before breakfast. 02/20/24   Sebastian Beverley NOVAK, MD  meclizine  (ANTIVERT ) 25 MG tablet Take 1 tablet (25 mg total) by mouth 2 (two) times daily. 12/06/23   Berneta Elsie Sayre, MD  melatonin 5 MG TABS Take 1 tablet (5 mg total) by mouth at bedtime as needed. 12/06/23    Berneta Elsie Sayre, MD  memantine  (NAMENDA ) 5 MG tablet TAKE 2 TABLETS BY MOUTH EVERY MORNING and TAKE 1 TABLET EVERY EVENING 10/11/23   Sebastian Beverley NOVAK, MD  montelukast  (SINGULAIR ) 10 MG tablet Take 1 tablet (10 mg total) by mouth at bedtime. 04/24/22   Sebastian Beverley NOVAK, MD  mupirocin ointment (BACTROBAN) 2 % Apply 1 Application topically. 12/19/23   [provider]  nitroGLYCERIN  (NITROSTAT ) 0.4 MG SL tablet Dissolve 1 tablet under the tongue every 5 minutes as needed for chest pain. Max of 3 doses, then 911. 09/20/23   Nahser, Aleene PARAS, MD  NOVOLOG  FLEXPEN 100 UNIT/ML FlexPen INJECT 14 UNITS SUBCUTANEOUSLY TWICE DAILY WITH A MEAL . DO NOT EXCEED 70 PER 24 HOURS 11/02/23   Shamleffer, Donell Cardinal, MD  nystatin  (MYCOSTATIN /NYSTOP ) powder Apply 1 Application topically 3 (three) times daily. 03/21/23   Billy Knee, FNP  ondansetron  (ZOFRAN ) 4 MG tablet TAKE 1 TABLET BY MOUTH EVERY 8 HOURS AS NEEDED FOR NAUSEA FOR VOMITING    [provider]  ondansetron  (ZOFRAN -ODT) 4 MG disintegrating tablet Take 1 tablet (4 mg total) by mouth every 8 (eight) hours as needed for nausea or vomiting. 11/28/23   Curatolo, Adam, DO  oxyCODONE -acetaminophen  (PERCOCET) 7.5-325 MG tablet Take 1 tablet by mouth every 12 (twelve) hours.    [provider]  pantoprazole  (PROTONIX ) 20 MG tablet TAKE 1 TABLET BY MOUTH ONCE DAILY 10/05/23   Sebastian Beverley NOVAK, MD  ranolazine  (RANEXA ) 500 MG 12 hr tablet Take 1 tablet (500 mg total) by mouth 2 (two) times daily. 10/13/23   Fairy Frames, MD  Semaglutide  (RYBELSUS ) 7 MG TABS Take 1 tablet (7 mg total) by mouth daily. 10/09/23   Sebastian Beverley NOVAK, MD  sodium zirconium cyclosilicate  (LOKELMA ) 10 g PACK packet USE 1 PACKET BY MOUTH AS DIRECTED DAILY    [provider]  torsemide  (DEMADEX ) 20 MG tablet TAKE 1 TABLET BY MOUTH EVERY DAY TAKE AN EXTRA DOSE FOR WEIGHT GAIN OF 3LBs IN 1 DAY OR 5LBS IN 1 WEEK OR FOR WORSENING SWELLING 02/07/24   Sebastian Beverley NOVAK, MD  traMADol  (ULTRAM ) 50 MG tablet TAKE 1 TABLET BY MOUTH EVERY 12 HOURS AS NEEDED TAKE THE FIRST 4 DOSES WITH ZOFRAN  TO MINIMIZE NAUSEA    [provider]  TYLENOL  500 MG tablet Take 1,000 mg by mouth every 6 (six) hours as needed for mild pain or headache.    [provider]  TYLENOL  PM EXTRA STRENGTH 500-25 MG TABS tablet Take 2 tablets by mouth at bedtime.    [provider]  Ubrogepant  (UBRELVY ) 50 MG TABS Take 1 tab at onset of migraine.  May repeat in 2 hrs, if needed.  Max dose: 2 tabs/day. This is a 30 day prescription. 01/15/24   Onita Duos, MD  Vitamin D , Ergocalciferol , (DRISDOL ) 1.25 MG (50000 UNIT) CAPS capsule TAKE 1 CAPSULE  BY MOUTH ONCE WEEKLY ON MONDAY 01/10/24   Onesimo Emaline Brink, MD    Allergies: Sulfonamide derivatives, Duloxetine , Lokelma  [sodium zirconium cyclosilicate ], Sulfamethoxazole-trimethoprim, Aricept  [donepezil  hcl], and Tramadol     Review of Systems  Updated Vital Signs BP (!) 109/50   Pulse 66   Temp 97.8 F (36.6 C) (Oral)   Resp 18   Ht 1.702 m (5' 7)   Wt 124.7 kg   SpO2 97%   BMI 43.07 kg/m   Physical Exam Vitals and nursing note reviewed.  Constitutional:      General: She is not in acute distress.    Appearance: She is well-developed.  HENT:     Head: Normocephalic and atraumatic.  Eyes:     Conjunctiva/sclera: Conjunctivae normal.  Cardiovascular:     Rate and Rhythm: Normal rate and regular rhythm.  Pulmonary:     Effort: Pulmonary effort is normal. No respiratory distress.     Breath sounds: Normal breath sounds. No stridor.  Abdominal:     General: There is no distension.  Skin:    General: Skin is warm and dry.  Neurological:     Mental Status: She is alert and oriented to person, place, and time.     Cranial Nerves: No cranial nerve deficit.  Psychiatric:        Mood and Affect: Mood normal.     (all labs ordered are listed, but only abnormal results are displayed) Labs Reviewed   COMPREHENSIVE METABOLIC PANEL WITH GFR - Abnormal; Notable for the following components:      Result Value   Sodium 134 (*)    CO2 21 (*)    Glucose, Bld 167 (*)    BUN 91 (*)    Creatinine, Ser 2.80 (*)    Calcium  8.2 (*)    Albumin  2.9 (*)    AST 12 (*)    GFR, Estimated 16 (*)    All other components within normal limits  CBC WITH DIFFERENTIAL/PLATELET - Abnormal; Notable for the following components:   WBC 13.9 (*)    RBC 2.18 (*)    Hemoglobin 5.9 (*)    HCT 20.5 (*)    MCHC 28.8 (*)    Neutro Abs 9.8 (*)    All other components within normal limits  CBG MONITORING, ED  TYPE AND SCREEN  PREPARE RBC (CROSSMATCH)    EKG: EKG Interpretation Date/Time:  Wednesday February 27 2024 11:43:53 EDT Ventricular Rate:  68 PR Interval:  177 QRS Duration:  107 QT Interval:  430 QTC Calculation: 458 R Axis:   34  Text Interpretation: Sinus rhythm Ventricular premature complex Abnormal R-wave progression, early transition Nonspecific T abnormalities, lateral leads Confirmed by Garrick Charleston [phone]) on 02/27/2024 1:05:50 PM  Radiology: No results found.   Procedures   Medications Ordered in the ED  0.9 %  sodium chloride  infusion (Manually program via Guardrails IV Fluids) (has no administration in time range)  acetaminophen  (TYLENOL ) tablet 1,000 mg (1,000 mg Oral Given 02/27/24 1117)                                    Medical Decision Making Elderly female with chronic kidney disease, iron  deficiency anemia, baseline hemoglobin 7, with frequent requirement of transfusions presents with fatigue, weakness reported outpatient labs consistent with anemia.  Concern for this, though other considerations including infection, dehydration are considered. Patient had labs from triage, monitoring, cardiac 70 sinus  normal pulse ox 100% room air normal  Amount and/or Complexity of Data Reviewed Independent Historian: caregiver External Data Reviewed: notes.    Details: Oncology  notes Labs: ordered. Decision-making details documented in ED Course. ECG/medicine tests: ordered and independent interpretation performed. Decision-making details documented in ED Course.  Risk OTC drugs. Prescription drug management. Decision regarding hospitalization. Diagnosis or treatment significantly limited by social determinants of health.   Patient's labs reviewed, discussed with family members, patient with hemoglobin 5.9, down from baseline 7.  She is amenable to transfusion.  Other labs are still reassuring, consistent with known history of microcytic anemia and renal dysfunction, anemia likely multifactorial given her history of iron  deficiency anemia, prior occult blood loss, though she has no overt bleeding currently. Patient will start transfusion require admission for further monitoring, management.  CRITICAL CARE Performed by: Lamar Salen Total critical care time: 35 minutes Critical care time was exclusive of separately billable procedures and treating other patients. Critical care was necessary to treat or prevent imminent or life-threatening deterioration. Critical care was time spent personally by me on the following activities: development of treatment plan with patient and/or surrogate as well as nursing, discussions with consultants, evaluation of patient's response to treatment, examination of patient, obtaining history from patient or surrogate, ordering and performing treatments and interventions, ordering and review of laboratory studies, ordering and review of radiographic studies, pulse oximetry and re-evaluation of patient's condition.   Final diagnoses:  Symptomatic anemia    ED Discharge Orders     None          Salen Lamar, MD 02/27/24 1330

## 2024-02-27 NOTE — ED Provider Triage Note (Signed)
 Emergency Medicine Provider Triage Evaluation Note  [name] , a 89 y.o. female  was evaluated in triage.  Pt complains of fatigue, headache.  She has a history of end-stage renal disease not on dialysis.  Family with her notes that she has been weaker than usual, complaining intermittently of chest pain, headache.  Review of Systems  Positive: Headache, weakness Negative: Fall  Physical Exam  BP (!) 111/55 (BP Location: Right Wrist)   Pulse 72   Temp 97.6 F (36.4 C)   Resp 20   SpO2 100%  Gen:   Awake, no distress elderly female speaking clearly Resp:  Normal effort no increased work of breathing MSK:   Moves extremities without difficulty no obvious deformity Other:  Neuro grossly intact  Medical Decision Making  Medically screening exam initiated at 11:08 AM.  Appropriate orders placed.  [name] was informed that the remainder of the evaluation will be completed by another provider, this initial triage assessment does not replace that evaluation, and the importance of remaining in the ED until their evaluation is complete.   Nichole Charleston, MD 02/27/24 [phone]

## 2024-02-28 DIAGNOSIS — D649 Anemia, unspecified: Secondary | ICD-10-CM | POA: Diagnosis not present

## 2024-02-28 LAB — BPAM RBC
Blood Product Expiration Date: 202510222359
Blood Product Unit Number: 202510222359
Blood Product Unit Number: 202511152359
Blood Product Unit Number: 202511152359
ISSUE DATE / TIME: 202510221937
PRODUCT CODE: 202510221340
PRODUCT CODE: 202510221529
PRODUCT CODE: 202510221852
PRODUCT CODE: 202510222359
PRODUCT CODE: 202510222359
PRODUCT CODE: 202511152359
Unit Type and Rh: 600
Unit Type and Rh: 202510221340
Unit Type and Rh: 202510221529
Unit Type and Rh: 202511152359
Unit Type and Rh: 600
Unit Type and Rh: 600
Unit Type and Rh: 6200
Unit Type and Rh: 6200

## 2024-02-28 LAB — TYPE AND SCREEN
ABO/RH(D): A POS
Antibody Screen: NEGATIVE
Unit division: 0
Unit division: 0
Unit division: 0
Unit division: 0

## 2024-02-28 LAB — CBC
HCT: 26.1 % — ABNORMAL LOW (ref 36.0–46.0)
Hemoglobin: 8.2 g/dL — ABNORMAL LOW (ref 12.0–15.0)
MCH: 28.4 pg (ref 26.0–34.0)
MCHC: 31.4 g/dL (ref 30.0–36.0)
MCV: 90.3 fL (ref 80.0–100.0)
Platelets: 279 K/uL (ref 150–400)
RBC: 2.89 MIL/uL — ABNORMAL LOW (ref 3.87–5.11)
RDW: 15.7 % — ABNORMAL HIGH (ref 11.5–15.5)
WBC: 16.4 K/uL — ABNORMAL HIGH (ref 4.0–10.5)
nRBC: 0 % (ref 0.0–0.2)

## 2024-02-28 LAB — GLUCOSE, CAPILLARY
Glucose-Capillary: 67 mg/dL — ABNORMAL LOW (ref 70–99)
Glucose-Capillary: 85 mg/dL (ref 70–99)
Glucose-Capillary: 152 mg/dL — ABNORMAL HIGH (ref 70–99)
Glucose-Capillary: 159 mg/dL — ABNORMAL HIGH (ref 70–99)
Glucose-Capillary: 204 mg/dL — ABNORMAL HIGH (ref 70–99)

## 2024-02-28 LAB — COMPREHENSIVE METABOLIC PANEL WITH GFR
ALT: 10 U/L (ref 0–44)
AST: 15 U/L (ref 15–41)
Albumin: 2.6 g/dL — ABNORMAL LOW (ref 3.5–5.0)
Alkaline Phosphatase: 62 U/L (ref 38–126)
Anion gap: 8 (ref 5–15)
BUN: 91 mg/dL — ABNORMAL HIGH (ref 8–23)
CO2: 21 mmol/L — ABNORMAL LOW (ref 22–32)
Calcium: 8.2 mg/dL — ABNORMAL LOW (ref 8.9–10.3)
Chloride: 108 mmol/L (ref 98–111)
Creatinine, Ser: 2.48 mg/dL — ABNORMAL HIGH (ref 0.44–1.00)
GFR, Estimated: 18 mL/min — ABNORMAL LOW (ref >60)
Glucose, Bld: 105 mg/dL — ABNORMAL HIGH (ref 70–99)
Potassium: 5.1 mmol/L (ref 3.5–5.1)
Sodium: 137 mmol/L (ref 135–145)
Total Bilirubin: 0.9 mg/dL (ref 0.0–1.2)
Total Protein: 6.3 g/dL — ABNORMAL LOW (ref 6.5–8.1)

## 2024-02-28 LAB — OCCULT BLOOD X 1 CARD TO LAB, STOOL: Fecal Occult Bld: POSITIVE — AB

## 2024-02-28 MED ORDER — BOOST / RESOURCE BREEZE PO LIQD CUSTOM
1.0000 | Freq: Three times a day (TID) | ORAL | Status: DC
  Administered 2024-02-28 – 2024-02-29 (×4): 1 via ORAL

## 2024-02-28 NOTE — Inpatient Diabetes Management (Signed)
 Inpatient Diabetes Program Recommendations  AACE/ADA: New Consensus Statement on Inpatient Glycemic Control (2015)  Target Ranges:  Prepandial:   less than 140 mg/dL      Peak postprandial:   less than 180 mg/dL (1-2 hours)      Critically ill patients:  140 - 180 mg/dL   Lab Results  Component Value Date   GLUCAP 85 02/28/2024   HGBA1C 7.4 (A) 12/12/2023    Latest Reference Range & Units 02/27/24 17:53 02/27/24 21:22 02/28/24 08:17 02/28/24 08:55  Glucose-Capillary 70 - 99 mg/dL 856 (H) 816 (H) 67 (L) 85  (H): Data is abnormally high (L): Data is abnormally low  Diabetes history: DM2 Outpatient Diabetes medications:  Toujeo  40 units daily Novolog  14 units tid meal coverage Rybelsus  7 mg daily  Current orders for Inpatient glycemic control: Semglee  20 units daily Novolog  0-15 units tid correction  Inpatient Diabetes Program Recommendations:   Patient had hypoglycemia this am without Novolog  correction. Fasting CBG this am 67. Please consider: -Decrease Semglee  to 16 units daily -Decrease Novolog  correction to 0-9 units tid   Thank you, Merryn Thaker E. Swayzee Wadley, RN, MSN, CNS, CDCES  Diabetes Coordinator Inpatient Glycemic Control Team Team Pager [phone] (8am-5pm) 02/28/2024 12:28 PM

## 2024-02-28 NOTE — Evaluation (Signed)
 Clinical/Bedside Swallow Evaluation Patient Details  Name: [name] MRN: [id number] Date of Birth: 01-01-1935  Today's Date: 02/28/2024 Time: SLP Start Time (ACUTE ONLY): 0919 SLP Stop Time (ACUTE ONLY): 0942 SLP Time Calculation (min) (ACUTE ONLY): 23 min  Past Medical History:  Past Medical History:  Diagnosis Date   Abdominal pain 01/26/2012   Acute deep vein thrombosis (DVT) of femoral vein of right lower extremity (HCC) 03/26/2022   Acute kidney injury superimposed on chronic kidney disease 12/18/2014   Acute upper GI bleed 03/26/2022   Allergy     takes Mucinex  daily as needed   Anemia, unspecified    Anxiety    takes Clonazepam  daily as needed   Anxiety associated with depression 06/08/2017   Arthritis    Breast cancer (HCC)    Breast cancer screening 02/19/2014   Breast mass 10/27/2014   Chronic diastolic CHF (congestive heart failure) (HCC)    takes Furosemide  daily   Chronic pain syndrome 02/06/2015   CKD (chronic kidney disease), stage III (HCC)    Coronary artery disease    a. s/p IMI 2004 tx with BMS to RCA;  b. s/p Promus DES to RCA 2/12 c. abnormal nuc 2016 -> cath with med rx. d. NSTEMI 02/2020  mv CAD with severe ISR in pRCA and m/dRCA lesion both treated with DES.   Debility 08/01/2012   Dementia (HCC)    Depression    takes Cymbalta  daily   Diabetes mellitus    insulin  daily   DOE (dyspnea on exertion) 06/24/2014   Dysphagia, unspecified(787.20) 07/31/2012   Fungal dermatitis 11/13/2007   Qualifier: Diagnosis of  By: Jame  MD, Maude FALCON    GERD (gastroesophageal reflux disease)    takes Protonix  daily   Gout, unspecified    Headache    occasionally   History of anemia due to chronic kidney disease 03/26/2022   History of blood clots    History of deep venous thrombosis 01/27/2012   History of pulmonary embolus (PE) 04/22/2014   Hyperkalemia 07/26/2017   Hyperlipidemia    takes Pravastatin  daily   Hypertension    Insomnia    Joint  pain    Joint swelling    Morbid obesity (HCC)    Multiple benign nevi 11/15/2016   Myocardial infarction [facility]) 2004   Non-STEMI (non-ST elevated myocardial infarction) (HCC) 02/15/2020   Nonintractable headache 01/07/2008   Qualifier: Diagnosis of   By: Jame  MD, Maude FALCON        Numbness    Obstructive sleep apnea    does not wear cpap   Osteoarthritis    Osteoarthritis    Osteoarthrosis, unspecified whether generalized or localized, lower leg    Pain in the chest 12/31/2013   Pain, chronic    Personal history of other diseases of the digestive system 12/03/2006   Qualifier: Diagnosis of  By: Jame  MD, Maude FALCON    Polymyalgia rheumatica    Presence of stent in right coronary artery    Pulmonary emboli (HCC) 01/2012   felt to need lifelong anticoagulation but Xarelto  dc in 02/2020 due to need for DAPT   Situational depression 06/04/2009   Qualifier: Diagnosis of  By: Jame  MD, Maude FALCON    Syncope, vasovagal 07/04/2021   Type 2 diabetes mellitus with hyperlipidemia (HCC) 02/16/2022   Urinary incontinence    takes Linzess  daily   Vaginitis and vulvovaginitis 06/23/2016   Vertigo    hx of;was taking Meclizine  if needed   Wears  dentures    Past Surgical History:  Past Surgical History:  Procedure Laterality Date   ABDOMINAL HYSTERECTOMY     partial   APPENDECTOMY     blood clots/legs and lungs  2013   BREAST BIOPSY Left 07/22/2014   BREAST BIOPSY Left 02/10/2013   BREAST LUMPECTOMY Left 11/05/2014   BREAST LUMPECTOMY WITH RADIOACTIVE SEED LOCALIZATION Left 11/05/2014   Procedure: LEFT BREAST LUMPECTOMY WITH RADIOACTIVE SEED LOCALIZATION;  Surgeon: Vicenta Poli, MD;  Location: [facility] OR;  Service: General;  Laterality: Left;   CARDIAC CATHETERIZATION     COLONOSCOPY     CORONARY ANGIOPLASTY  2   CORONARY STENT INTERVENTION N/A 02/17/2020   Procedure: CORONARY STENT INTERVENTION;  Surgeon: Anner Alm ORN, MD;  Location: [facility] INVASIVE CV LAB;  Service:  Cardiovascular;  Laterality: N/A;   ESOPHAGOGASTRODUODENOSCOPY (EGD) WITH PROPOFOL  N/A 11/07/2016   Procedure: ESOPHAGOGASTRODUODENOSCOPY (EGD) WITH PROPOFOL ;  Surgeon: Avram Lupita BRAVO, MD;  Location: [facility] ENDOSCOPY;  Service: Endoscopy;  Laterality: N/A;   ESOPHAGOGASTRODUODENOSCOPY (EGD) WITH PROPOFOL  N/A 01/05/2023   Procedure: ESOPHAGOGASTRODUODENOSCOPY (EGD) WITH PROPOFOL ;  Surgeon: Abran Norleen SAILOR, MD;  Location: [facility] ENDOSCOPY;  Service: Gastroenterology;  Laterality: N/A;   EXCISION OF SKIN TAG Right 11/05/2014   Procedure: EXCISION OF RIGHT EYELID SKIN TAG;  Surgeon: Vicenta Poli, MD;  Location: [facility] OR;  Service: General;  Laterality: Right;   EYE SURGERY Bilateral    cataract    GASTRIC BYPASS  1977    reversed in 1979, [facility]   LEFT HEART CATH AND CORONARY ANGIOGRAPHY N/A 02/17/2020   Procedure: LEFT HEART CATH AND CORONARY ANGIOGRAPHY;  Surgeon: Anner Alm ORN, MD;  Location: [facility] INVASIVE CV LAB;  Service: Cardiovascular;  Laterality: N/A;   LEFT HEART CATHETERIZATION WITH CORONARY ANGIOGRAM N/A 06/29/2014   Procedure: LEFT HEART CATHETERIZATION WITH CORONARY ANGIOGRAM;  Surgeon: Debby DELENA Sor, MD;  Location: [facility] CATH LAB;  Service: Cardiovascular;  Laterality: N/A;   MEMBRANE PEEL Right 10/23/2018   Procedure: MEMBRANE PEEL;  Surgeon: Elner Arley DELENA, MD;  Location: [facility] OR;  Service: Ophthalmology;  Laterality: Right;   MI with stent placement  2004   PARS PLANA VITRECTOMY Right 10/23/2018   Procedure: PARS PLANA VITRECTOMY WITH 25 GAUGE;  Surgeon: Elner Arley DELENA, MD;  Location: [facility] OR;  Service: Ophthalmology;  Laterality: Right;   HPI:  [name] is an 89 y.o. female who presented to ED with acute on chronic anemia, leukocytosis, difficulty swallowing x 2 months.  PMHx hypertension, diabetes, CKD 4, anemia, hiatal hernia, GERD, gout, diastolic CHF, CAD, migraine, dementia, GI bleed, VTE, spinal stenosis, pain, COPD, breast cancer. EGD 12/2022- tortuous esophagus, unremarkable  EGD. MBS 5//2014 with primary esophageal dysphagia. Pt c/o occasional choking with water ; no difficulty with solid foods.  + Globus.    Assessment / Plan / Recommendation  Clinical Impression  Pt presents with normal oropharyngeal swallow. There is thorough mastication, the appearance of a brisk swallow, no s/s of aspiration. She indicates globus sensation.  She does have a hx of tortuous esophagus and concerns for a primary esophageal dysphagia dating back to 2014. Recommend advancing her diet back to regular solids/thin liquids when medically ready (currently on full liquids).  No SLP f/u is needed. D/W pt/son at bedside. Will sign off.    SLP Visit Diagnosis: Dysphagia, unspecified (R13.10)    Aspiration Risk  No limitations    Diet Recommendation    (per medical team)  Medication Administration: Whole meds with liquid    Other  Recommendations Oral Care Recommendations: Oral care BID       Swallow Study   General Date of Onset: 02/27/24 HPI: ALEYNA CUEVA is an 89 y.o. female who presented to ED with acute on chronic anemia, leukocytosis, difficulty swallowing x 2 months.  PMHx hypertension, diabetes, CKD 4, anemia, hiatal hernia, GERD, gout, diastolic CHF, CAD, migraine, dementia, GI bleed, VTE, spinal stenosis, pain, COPD, breast cancer. EGD 12/2022- tortuous esophagus, unremarkable EGD. MBS 5//2014 with primary esophageal dysphagia. Pt c/o occasional choking with water ; no difficulty with solid foods.  + Globus. Type of Study: Bedside Swallow Evaluation Previous Swallow Assessment: 2014 MBS esophageal dysphagia Diet Prior to this Study: Full liquid diet Temperature Spikes Noted: No Respiratory Status: Room air History of Recent Intubation: No Behavior/Cognition: Alert;Cooperative;Pleasant mood Oral Cavity Assessment: Within Functional Limits Oral Care Completed by SLP: No Oral Cavity - Dentition: Dentures, top;Dentures, bottom Vision: Functional for  self-feeding Self-Feeding Abilities: Able to feed self Patient Positioning: Upright in bed Baseline Vocal Quality: Normal Volitional Cough: Strong Volitional Swallow: Able to elicit    Oral/Motor/Sensory Function Overall Oral Motor/Sensory Function: Within functional limits   Ice Chips Ice chips: Within functional limits   Thin Liquid Thin Liquid: Within functional limits    Nectar Thick Nectar Thick Liquid: Not tested   Honey Thick Honey Thick Liquid: Not tested   Puree Puree: Within functional limits   Solid     Solid: Within functional limits      [name] Laurice 02/28/2024,9:45 AM   [name] L. Vona, MA CCC/SLP Clinical Specialist - Acute Care SLP Acute Rehabilitation Services Office number [phone]

## 2024-02-28 NOTE — Progress Notes (Signed)
 PROGRESS NOTE [name]    DOB: 14-Jun-1934, 89 y.o.  FMW:[id number]    Code Status: Limited: Do not attempt resuscitation (DNR) -DNR-LIMITED -Do Not Intubate/DNI    DOA: 02/27/2024   LOS: 0  Brief hospital course  [name] is a 89 y.o. female with a PMH significant for hypertension, hyperlipidemia, diabetes, CKD 4, anemia, hiatal hernia, GERD, gout, diastolic CHF, CAD, migraine, dementia, depression, anxiety, neuropathy, GI bleed, VTE, spinal stenosis, pain, COPD, obesity, breast cancer presenting with anemia as checked outpatient.  Recent oncology notes outpatient indicates that patient has history of anemia of chronic disease with some iron  deficiency anemia.  There is suspicion for some slow GI losses in the setting of more rapid decline in hemoglobin.  Baseline hemoglobin is around 8.  Did have labs checked on 8/22 showing normal iron , normal B12, normal folate, hemoglobin 7.7.  Ferritin not checked at that time. It appears last endoscopy was in 2024 when patient presented for GI bleed and EGD was unremarkable.   ED Course: Vital signs in the ED notable for blood pressure with 1 reading in the 70s systolic which may have been spurious otherwise blood pressure stable in the 90s-100 systolic.   Lab workup notable for CMP with sodium 134, bicarb 21, gap 16, BUN 91 which is stable, creatinine stable at 2.8, glucose 167, calcium  8.2, albumin  2.9.  CBC with hemoglobin 5.9, leukocytosis to 13.9.  Type and screen pending.  02/28/24 -She has undergone 2u pRBC transfusion and hgb is stable today. She has indicated that she is not interested in endoscopic evaluation at this time.  Will continue to monitor hgb and for signs of active bleeding. Currently denies abdominal pain. Has not had BM since admitted to floor.   Assessment & Plan  Principal Problem:   Symptomatic anemia Active Problems:   Essential hypertension   Chronic diastolic CHF (congestive heart failure) (HCC)   COPD with  chronic bronchitis (HCC)   Dementia (HCC)   Breast cancer (HCC)   GAD (generalized anxiety disorder)   Class 3 obesity (HCC)   Hyperlipidemia   History of DVT (deep vein thrombosis)   Hiatal hernia with GERD and esophagitis   Gout   Spinal stenosis, lumbar region, with neurogenic claudication   Iron  deficiency anemia   Severe episode of recurrent major depressive disorder, without psychotic features (HCC)   CKD (chronic kidney disease), stage IV (HCC)   Type 2 diabetes mellitus with stage 4 chronic kidney disease, with long-term current use of insulin  (HCC)   Diabetic peripheral neuropathy associated with type 2 diabetes mellitus (HCC)   CAD (coronary artery disease)  Symptomatic acute on chronic anemia Rule out GI bleed Presented with hgb 5.9. eliquis  was held and received 2u pRBC transfusion.  Hgb is now stable post transfusions 8.3>8.2. no BM since admission, no abdominal pain. She has indicated that she does not want endoscopic evaluation so as long as she is stable we can monitor and hold off on GI consult. Will continue to hold eliquis  and have pro/co conversation with her and her daughter on whether or not to restart.  FOBT order still pending as she has not had a BM and nurse did not want to get sample per rectum.  - continue to hold eliquis  - CBC am - continue PPI - full liquid diet and advance as tolerated - continue iron  supplement   Dysphagia > Reports some difficulty swallowing for the past month or 2. - SLP eval and treat  History of VTE > DVT in 2013 and 2023.  PE in 2015.  On Eliquis . - Holding Eliquis  in the setting of above   Breast cancer > Status post lobectomy.  Known residual DCIS. - Continues to follow with oncology   Hypertension - Low-normal BP in the ED. - Hold Coreg  for now   Hyperlipidemia - Continue home atorvastatin    Diabetes> reportedly on 40 units daily outpatient. - 20 units daily started on admission but having hypoglycemia so  stopped insulin  for now and will continue to monitor CBGs - SSI held for now   CKD 4 > Creatinine stable at 2.8.  BUN relatively stable at 91, bicarb 21, gap 16. -Trend renal function and electrolytes - Continue home calcitriol    Hiatal hernia GERD - IV PPI as above   Gout - Continue Oxistat   Chronic diastolic CHF > Last echo was in 2023 with EF 55-60%, G1 DD, normal RV function. - Continue with torsemide  for now in the setting of receiving transfusions this evening.  Hold if blood pressure does not improve, though blood pressure readings from the ED appear to been taken at her wrist and will be less accurate. - Holding home Coreg  as above   Dementia - Continue home Namenda    Depression Anxiety - Continue home amitriptyline    Neuropathy - Continue home gabapentin  - On amitriptyline  as above   Obesity - Noted   COPD - Continue home albuterol    CAD - Continue home atorvastatin  - Holding Coreg  as above  Body mass index is 43.07 kg/m.  VTE ppx: SCDs Start: 02/27/24 1408  Diet:     Diet   Diet full liquid Room service appropriate? Yes; Fluid consistency: Thin   Consultants: None  Subjective 02/28/24    Pt reports no abdominal pain. No BM since admission. Feels well overall. No nausea- tolerating breakfast without issues.    Objective  Blood pressure (!) 120/44, pulse 70, temperature 98.2 F (36.8 C), temperature source Oral, resp. rate 18, height 5' 7 (1.702 m), weight 124.7 kg, SpO2 100%.  Intake/Output Summary (Last 24 hours) at 02/28/2024 0736 Last data filed at 02/28/2024 0446 Gross per 24 hour  Intake 450 ml  Output 1150 ml  Net -700 ml   Filed Weights   02/27/24 1137  Weight: 124.7 kg     Physical Exam:  General: awake, alert, NAD HEENT: atraumatic, clear conjunctiva, anicteric sclera, MMM, hearing grossly normal Respiratory: normal respiratory effort. Cardiovascular: extremities well perfused, quick capillary refill, normal S1/S2, RRR,  no JVD, murmurs Gastrointestinal: soft, NT, ND Nervous: A&O x3. no gross focal neurologic deficits, normal speech Extremities: moves all equally, no edema, normal tone Skin: dry, intact, normal temperature, normal color. No rashes, lesions or ulcers on exposed skin Psychiatry: normal mood, congruent affect  Labs   I have personally reviewed the following labs and imaging studies CBC    Component Value Date/Time   WBC 16.4 (H) 02/28/2024 0245   RBC 2.89 (L) 02/28/2024 0245   HGB 8.2 (L) 02/28/2024 0245   HGB 7.9 (L) 01/28/2024 1255   HGB 9.3 (L) 03/02/2020 1517   HGB 8.7 (L) 03/21/2017 1351   HCT 26.1 (L) 02/28/2024 0245   HCT 29.5 (L) 03/02/2020 1517   HCT 28.9 (L) 03/21/2017 1351   PLT 279 02/28/2024 0245   PLT 276 01/28/2024 1255   PLT 254 03/02/2020 1517   MCV 90.3 02/28/2024 0245   MCV 88 03/02/2020 1517   MCV 84.3 03/21/2017 1351   MCH  28.4 02/28/2024 0245   MCHC 31.4 02/28/2024 0245   RDW 15.7 (H) 02/28/2024 0245   RDW 13.1 03/02/2020 1517   RDW 15.5 (H) 03/21/2017 1351   LYMPHSABS 2.9 02/27/2024 1109   LYMPHSABS 2.4 03/21/2017 1351   MONOABS 0.8 02/27/2024 1109   MONOABS 0.7 03/21/2017 1351   EOSABS 0.4 02/27/2024 1109   EOSABS 0.2 03/21/2017 1351   BASOSABS 0.1 02/27/2024 1109   BASOSABS 0.0 03/21/2017 1351      Latest Ref Rng & Units 02/28/2024    2:45 AM 02/27/2024   11:09 AM 01/28/2024   12:55 PM  BMP  Glucose 70 - 99 mg/dL 894  832  841   BUN 8 - 23 mg/dL 91  91  90   Creatinine 0.44 - 1.00 mg/dL 7.51  7.19  6.63   Sodium 135 - 145 mmol/L 137  134  136   Potassium 3.5 - 5.1 mmol/L 5.1  4.9  4.7   Chloride 98 - 111 mmol/L 108  106  105   CO2 22 - 32 mmol/L 21  21  24    Calcium  8.9 - 10.3 mg/dL 8.2  8.2  8.7    No results found.  Disposition Plan & Communication  Patient status: Observation  Admitted From: Home Planned disposition location: Home Anticipated discharge date: 10/24 pending hgb stability   Family Communication: none at bedside     Author: Marien LITTIE Piety, DO [facility] 02/28/2024, 7:36 AM   Available by Epic secure chat 7AM-7PM. If 7PM-7AM, please contact night-coverage.  [facility] contact information found on [email or web address].

## 2024-02-28 NOTE — Plan of Care (Signed)

## 2024-02-29 ENCOUNTER — Encounter (HOSPITAL_COMMUNITY): Payer: Self-pay

## 2024-02-29 ENCOUNTER — Other Ambulatory Visit (HOSPITAL_COMMUNITY): Payer: Self-pay

## 2024-02-29 ENCOUNTER — Encounter: Payer: Self-pay | Admitting: Hematology

## 2024-02-29 DIAGNOSIS — D649 Anemia, unspecified: Secondary | ICD-10-CM | POA: Diagnosis not present

## 2024-02-29 LAB — CBC
HCT: 26.7 % — ABNORMAL LOW (ref 36.0–46.0)
Hemoglobin: 8.2 g/dL — ABNORMAL LOW (ref 12.0–15.0)
MCH: 28.1 pg (ref 26.0–34.0)
MCHC: 30.7 g/dL (ref 30.0–36.0)
MCV: 91.4 fL (ref 80.0–100.0)
Platelets: 283 K/uL (ref 150–400)
RBC: 2.92 MIL/uL — ABNORMAL LOW (ref 3.87–5.11)
RDW: 15.9 % — ABNORMAL HIGH (ref 11.5–15.5)
WBC: 15.1 K/uL — ABNORMAL HIGH (ref 4.0–10.5)
nRBC: 0 % (ref 0.0–0.2)

## 2024-02-29 LAB — GLUCOSE, CAPILLARY
Glucose-Capillary: 143 mg/dL — ABNORMAL HIGH (ref 70–99)
Glucose-Capillary: 152 mg/dL — ABNORMAL HIGH (ref 70–99)
Glucose-Capillary: 92 mg/dL (ref 70–99)
Glucose-Capillary: 139 mg/dL — ABNORMAL HIGH (ref 70–99)

## 2024-02-29 MED ORDER — INSULIN GLARGINE (1 UNIT DIAL) 300 UNIT/ML ~~LOC~~ SOPN
15.0000 [IU] | PEN_INJECTOR | Freq: Every day | SUBCUTANEOUS | 0 refills | Status: DC
  Filled 2024-02-29: qty 1.5, 30d supply, fill #0

## 2024-02-29 MED ORDER — MELATONIN 5 MG PO TABS: 5.0000 mg | ORAL_TABLET | Freq: Every evening | ORAL | Status: AC | PRN

## 2024-02-29 MED ORDER — POLYETHYLENE GLYCOL 3350 17 GM/SCOOP PO POWD
17.0000 g | Freq: Every day | ORAL | 0 refills | Status: AC | PRN
  Filled 2024-02-29: qty 238, 14d supply, fill #0

## 2024-02-29 MED ORDER — LINACLOTIDE 72 MCG PO CAPS
72.0000 ug | ORAL_CAPSULE | Freq: Every day | ORAL | Status: AC | PRN
Start: 2024-02-29 — End: ?

## 2024-02-29 MED ORDER — PANTOPRAZOLE SODIUM 40 MG PO TBEC
40.0000 mg | DELAYED_RELEASE_TABLET | Freq: Two times a day (BID) | ORAL | 1 refills | Status: DC
  Filled 2024-02-29: qty 60, 30d supply, fill #0

## 2024-02-29 NOTE — Progress Notes (Signed)
 PT Cancellation Note  Patient Details Name: [name] MRN: [id number] DOB: 1935/02/02   Cancelled Treatment:    Reason Eval/Treat Not Completed: Patient declined, no reason specified. Pt reports transferring to the [facility] last night and feels confident in her ability to transfer at home with assistance from her family. Pt declines acute PT evaluation at this time, preferring to return home and resume HHPT with [facility].    [name] 02/29/2024, 3:21 PM

## 2024-02-29 NOTE — Discharge Summary (Signed)
 Physician Discharge Summary  Patient ID: [name] MRN: [id number] DOB/AGE: [birth year] 89 y.o.  Admit date: 02/27/2024 Discharge date: 02/29/2024  Admission Diagnoses:  Discharge Diagnoses:  Principal Problem:   Symptomatic anemia Active Problems:   Hyperlipidemia   Class 3 obesity (HCC)   Essential hypertension   History of DVT (deep vein thrombosis)   Chronic diastolic CHF (congestive heart failure) (HCC)   Hiatal hernia with GERD and esophagitis   COPD with chronic bronchitis (HCC)   Gout   Dementia (HCC)   Spinal stenosis, lumbar region, with neurogenic claudication   Iron  deficiency anemia   Breast cancer (HCC)   Severe episode of recurrent major depressive disorder, without psychotic features (HCC)   CKD (chronic kidney disease), stage IV (HCC)   Type 2 diabetes mellitus with stage 4 chronic kidney disease, with long-term current use of insulin  (HCC)   GAD (generalized anxiety disorder)   Diabetic peripheral neuropathy associated with type 2 diabetes mellitus (HCC)   CAD (coronary artery disease)   Discharged Condition: stable  Hospital Course: Patient is an 89 year old female with past medical history significant for DVT of right lower extremity, on Eliquis  prior to presentation, acute kidney injury, chronic kidney disease, GI bleed, anemia, chronic diastolic CHF, coronary artery disease, dementia, diabetes mellitus, gout, pulmonary emboli, vasovagal syncope, myocardial infarction, hypertension and hyperlipidemia.  Patient was admitted with symptomatic anemia.  On presentation, hemoglobin was 5.9 g/dL.  As per prior documentation, patient was not interested in endoscopic evaluation.  Patient was transfused with packed red blood cells.  Hemoglobin improved to 8.2 g/dL, remained stable.  The family has opted to hold Eliquis .  Patient be discharged back home to the care of the primary care provider.  Symptomatic acute on chronic anemia Rule out GI bleed -Patient  presented with hgb 5.9. - Eliquis  was held during the hospital stay. - Patient was transfused with 2 units of packed red blood cells. - Hemoglobin improved from 5.9 g/dL to 8.2 g/dL. - No overt bleeding. - Patient will be discharged back home to the care of the primary care provider and the GI team. - Patient reported that she was not interested in endoscopic procedures.   -Continue to monitor CBC closely. - Follow-up with GI team and PCP within 1 week of discharge. - The family has elected to keep holding Eliquis  on discharge.  The family will discuss further with primary care provider and GI team.   Dysphagia: -Patient was evaluated by the speech therapy during the hospital stay.   History of VTE > DVT in 2013 and 2023.  PE in 2015. - Patient was on Eliquis  prior to presentation.   -Eliquis  is currently on hold.   Breast cancer > Status post lobectomy.  Known residual DCIS. - Continues to follow with oncology   Hypertension - Continue to monitor closely.    Hyperlipidemia - Continue home atorvastatin    Diabetes: - Prior to presentation, patient was on subcutaneous glargine 40 units daily. - We decreased subcutaneous glargine to 15 units daily. - Continue to monitor blood sugar closely.     CKD 4 > Creatinine stable at 2.8.  BUN relatively stable at 91, bicarb 21, gap 16. -Trend renal function and electrolytes - Continue home calcitriol    Hiatal hernia GERD - IV PPI during the hospital stay. - Patient will be discharged back home on Protonix  40 mg p.o. twice daily.   Gout - Continue Oxistat   Chronic diastolic CHF > Last echo was in 2023 with  EF 55-60%, G1 DD, normal RV function. - Continue with torsemide     Dementia - Continue home Namenda    Depression Anxiety - Continue home amitriptyline    Neuropathy - Continue home gabapentin  - On amitriptyline  as above   Obesity - Noted   COPD - Continue home albuterol    CAD - Continue home atorvastatin  -  Holding Coreg  as above     consults: None  Significant Diagnostic Studies:  -Hemoglobin of 5.9 g/dL on presentation. - Prior to discharge, hemoglobin was 8.3 g/dL.   Discharge Exam: Blood pressure 100/73, pulse 70, temperature 98.2 F (36.8 C), temperature source Oral, resp. rate 18, height 5' 7 (1.702 m), weight 124.7 kg, SpO2 97%.   Disposition: Discharge disposition: 01-Home or Self Care       Discharge Instructions     Diet - low sodium heart healthy   Complete by: As directed    Diet renal/carb modified with fluid restriction   Complete by: As directed    Increase activity slowly   Complete by: As directed       Allergies as of 02/29/2024       Reactions   Sulfonamide Derivatives Anaphylaxis, Swelling, Other (See Comments)   Mouth swelling- no resp issues noted    Other Reaction: MOUTH SWELLS Substance with sulfonamide structure and antibacterial mechanism of action (substance)   Duloxetine  Other (See Comments)   Headaches   Aricept  [donepezil  Hcl] Diarrhea, Nausea And Vomiting, Other (See Comments)   GI upset/loose stools   Lokelma  [sodium Zirconium Cyclosilicate ] Other (See Comments)   Headache, stomach upset   Tramadol  Nausea And Vomiting        Medication List     STOP taking these medications    amoxicillin -clavulanate 875-125 MG tablet Commonly known as: AUGMENTIN    carvedilol  12.5 MG tablet Commonly known as: COREG    cetirizine  10 MG tablet Commonly known as: ZYRTEC    Eliquis  5 MG Tabs tablet Generic drug: apixaban    fluticasone  50 MCG/ACT nasal spray Commonly known as: FLONASE    folic acid  1 MG tablet Commonly known as: FOLVITE    hydrOXYzine  25 MG tablet Commonly known as: ATARAX    meclizine  25 MG tablet Commonly known as: ANTIVERT    NovoLOG  FlexPen 100 UNIT/ML FlexPen Generic drug: insulin  aspart   ondansetron  4 MG disintegrating tablet Commonly known as: ZOFRAN -ODT   ranolazine  500 MG 12 hr tablet Commonly known  as: RANEXA    Rybelsus  7 MG Tabs Generic drug: Semaglutide    Toujeo  Max SoloStar 300 UNIT/ML Solostar Pen Generic drug: insulin  glargine (2 Unit Dial ) Replaced by: insulin  glargine (1 Unit Dial ) 300 UNIT/ML Solostar Pen   TYLENOL  500 MG tablet Generic drug: acetaminophen    Ubrelvy  50 MG Tabs Generic drug: Ubrogepant        TAKE these medications    albuterol  108 (90 Base) MCG/ACT inhaler Commonly known as: ProAir  HFA Inhale 1-2 puffs into the lungs every 6 (six) hours as needed for wheezing or shortness of breath.   amitriptyline  75 MG tablet Commonly known as: ELAVIL  Take 1 tablet (75 mg total) by mouth at bedtime.   atorvastatin  80 MG tablet Commonly known as: LIPITOR  TAKE 1 TABLET BY MOUTH EVERY DAY   calcitRIOL  0.25 MCG capsule Commonly known as: ROCALTROL  Take 0.25 mcg by mouth daily.   febuxostat  40 MG tablet Commonly known as: ULORIC  TAKE 1 TABLET BY MOUTH EVERY DAY   FreeStyle Libre 3 Plus Sensor Misc 1 Device by Other route every 14 (fourteen) days. Change sensor every 15 days.  gabapentin  100 MG capsule Commonly known as: NEURONTIN  Take 1 capsule (100 mg total) by mouth at bedtime.   glucagon  1 MG injection Inject 1 mg into the vein once as needed for up to 1 dose.   insulin  glargine (1 Unit Dial ) 300 UNIT/ML Solostar Pen Commonly known as: TOUJEO  Inject 15 Units into the skin daily in the afternoon. Replaces: Toujeo  Max SoloStar 300 UNIT/ML Solostar Pen   ipratropium-albuterol  0.5-2.5 (3) MG/3ML Soln Commonly known as: DUONEB Take 3 mLs by nebulization every 6 (six) hours as needed (cough, wheezing, or shortness of breath).   linaclotide  72 MCG capsule Commonly known as: Linzess  Take 1 capsule (72 mcg total) by mouth daily as needed (constpation).   melatonin 5 MG Tabs Take 1 tablet (5 mg total) by mouth at bedtime as needed (sleep).   memantine  5 MG tablet Commonly known as: NAMENDA  TAKE 2 TABLETS BY MOUTH EVERY MORNING and TAKE 1 TABLET  EVERY EVENING What changed:  how much to take how to take this when to take this additional instructions   nitroGLYCERIN  0.4 MG SL tablet Commonly known as: Nitrostat  Dissolve 1 tablet under the tongue every 5 minutes as needed for chest pain. Max of 3 doses, then 911.   pantoprazole  40 MG tablet Commonly known as: Protonix  Take 1 tablet (40 mg total) by mouth 2 (two) times daily. What changed:  medication strength how much to take when to take this   polyethylene glycol 17 g packet Commonly known as: MIRALAX  / GLYCOLAX  Take 17 g by mouth daily as needed for mild constipation.   torsemide  20 MG tablet Commonly known as: DEMADEX  TAKE 1 TABLET BY MOUTH EVERY DAY TAKE AN EXTRA DOSE FOR WEIGHT GAIN OF 3LBs IN 1 DAY OR 5LBS IN 1 WEEK OR FOR WORSENING SWELLING   Vitamin D  (Ergocalciferol ) 1.25 MG (50000 UNIT) Caps capsule Commonly known as: DRISDOL  TAKE 1 CAPSULE BY MOUTH ONCE WEEKLY ON MONDAY       Time spent: 35 minutes.  SignedBETHA Leatrice LILLETTE Rosario 02/29/2024, 3:39 PM

## 2024-02-29 NOTE — TOC Initial Note (Signed)
 Transition of Care (TOC) - Initial/Assessment Note   Spoke to patient at bedside. Patient from home with daughter. HAs walker at home and a seat in her shower. She is active with [facility] for HHPT/OT and wants to continue. [facility] will need orders , asked MD for orders . [facility] aware discharge today  Patient Details  Name: [name] MRN: [id number] Date of Birth: 1934/06/08  Transition of Care [facility]) CM/SW Contact:    Stephane Powell Jansky, RN Phone Number: 02/29/2024, 3:42 PM  Clinical Narrative:                   Expected Discharge Plan: Home w Home Health Services Barriers to Discharge: No Barriers Identified   Patient Goals and CMS Choice Patient states their goals for this hospitalization and ongoing recovery are:: to return to home CMS Medicare.gov Compare Post Acute Care list provided to:: Patient Choice offered to / list presented to : Patient      Expected Discharge Plan and Services   Discharge Planning Services: CM Consult Post Acute Care Choice: Home Health Living arrangements for the past 2 months: Single Family Home Expected Discharge Date: 02/29/24               DME Arranged: N/A         HH Arranged: PT, OT HH Agency: [facility] Date HH Agency Contacted: 02/29/24 Time HH Agency Contacted: [phone] Representative spoke with at [facility] Agency: [place]  Prior Living Arrangements/Services Living arrangements for the past 2 months: Single Family Home Lives with:: Adult Children Patient language and need for interpreter reviewed:: Yes Do you feel safe going back to the place where you live?: Yes      Need for Family Participation in Patient Care: Yes (Comment) Care giver support system in place?: Yes (comment) Current home services: DME Criminal Activity/Legal Involvement Pertinent to Current Situation/Hospitalization: No - Comment as needed  Activities of Daily Living   ADL Screening (condition at time of admission) Is the patient deaf or  have difficulty hearing?: Yes (hearing aide bilateral) Does the patient have difficulty seeing, even when wearing glasses/contacts?: Yes Does the patient have difficulty concentrating, remembering, or making decisions?: Yes  Permission Sought/Granted   Permission granted to share information with : Yes, Verbal Permission Granted     Permission granted to share info w AGENCY: [facility]        Emotional Assessment Appearance:: Appears stated age Attitude/Demeanor/Rapport: Engaged Affect (typically observed): Appropriate Orientation: : Oriented to Self, Oriented to Place, Oriented to  Time, Oriented to Situation Alcohol / Substance Use: Not Applicable Psych Involvement: No (comment)  Admission diagnosis:  Symptomatic anemia [D64.9] Patient Active Problem List   Diagnosis Date Noted   Laceration of right great toe without foreign body present or damage to nail 01/16/2024   Nonintractable headache 01/15/2024   Gait abnormality 01/15/2024   Chronic migraine w/o aura w/o status migrainosus, not intractable 01/15/2024   Unstable angina (HCC) 10/12/2023   Hyperglycemia due to diabetes mellitus (HCC) 10/05/2023   Whole body pain 09/25/2023   Hyperkalemia 06/04/2023   Limited mobility 04/09/2023   PND (post-nasal drip) 01/23/2023   Chronic ethmoidal sinusitis 01/11/2023   ABLA (acute blood loss anemia) 01/11/2023   Recurrent acute suppurative otitis media of right ear without spontaneous rupture of tympanic membrane 01/11/2023   Viral URI with cough 01/11/2023   Upper GI bleed 01/03/2023   CAD (coronary artery disease) 01/03/2023   Epigastric abdominal tenderness 01/03/2023   Chills 12/19/2022  Concha bullosa 11/28/2022   Tinnitus of both ears 10/16/2022   Chronic left hip pain 06/13/2022   Dependent for medication administration 06/13/2022   Very limited skin mobility 05/29/2022   Swelling of right upper extremity 04/27/2022   Carpal tunnel syndrome on both sides 04/27/2022    Diabetic peripheral neuropathy associated with type 2 diabetes mellitus (HCC) 04/05/2022   Melena 03/26/2022   GAD (generalized anxiety disorder) 03/26/2022   Allergic rhinitis 03/26/2022   Type 2 diabetes mellitus with stage 4 chronic kidney disease, with long-term current use of insulin  (HCC) 02/16/2022   Dermatosis papulosa nigra 02/09/2022   CKD (chronic kidney disease), stage IV (HCC) 02/09/2022   Pneumobilia 02/08/2022   Severe episode of recurrent major depressive disorder, without psychotic features (HCC) 12/05/2021   Breast cancer (HCC) 10/22/2021   Chronic pain of right hand 04/27/2021   Iron  deficiency anemia 09/13/2020   Bilateral primary osteoarthritis of knee 04/12/2020   Chronic radicular lumbar pain 03/18/2020   Lumbar spondylosis 03/18/2020   Spinal stenosis, lumbar region, with neurogenic claudication 03/18/2020   Moderate nonproliferative diabetic retinopathy of both eyes without macular edema associated with type 2 diabetes mellitus (HCC) 08/27/2019   GERD with esophagitis 11/15/2016   Dementia (HCC) 11/15/2016   Endometrial polyp 06/23/2016   Vitamin D  deficiency 08/02/2015   Gout 10/27/2014   Vertigo 09/24/2014   Drug-induced constipation 05/10/2014   COPD with chronic bronchitis (HCC) 04/21/2014   Estrogen deficiency 02/19/2014   Insomnia 09/17/2012   Weakness 08/01/2012   Hiatal hernia with GERD and esophagitis 07/31/2012   Chronic anticoagulation 02/02/2012   Chronic diastolic CHF (congestive heart failure) (HCC) 01/29/2012   History of DVT (deep vein thrombosis) 01/27/2012   Class 3 obesity (HCC) 02/15/2009   Symptomatic anemia 02/15/2009   TENSION HEADACHE 01/07/2008   Hyperlipidemia 12/03/2006   Essential hypertension 12/03/2006   PCP:  Sebastian Beverley NOVAK, MD Pharmacy:   [facility] [address], [place] - [address] [place] [address] Phone: [phone] Fax: [phone]  [facility]  [place], [place] - [address] [place] [address] Phone: [phone] Fax: [phone]  [facility] - [place], [place] - [address] [place] [address] Phone: [phone] Fax: [phone]  [facility] [address] [place] [address] Phone: [phone] Fax: [phone]     Social Drivers of Health (SDOH) Social History: SDOH Screenings   Food Insecurity: No Food Insecurity (02/27/2024)  Housing: Low Risk  (02/27/2024)  Transportation Needs: No Transportation Needs (02/27/2024)  Utilities: Not At Risk (02/27/2024)  Alcohol Screen: Low Risk  (09/28/2023)  Depression (PHQ2-9): Low Risk  (02/20/2024)  Recent Concern: Depression (PHQ2-9) - High Risk (01/16/2024)  Financial Resource Strain: Low Risk  (09/28/2023)  Physical Activity: Inactive (09/28/2023)  Social Connections: Socially Isolated (02/27/2024)  Stress: Stress Concern Present (09/28/2023)  Tobacco Use: Low Risk  (02/27/2024)  Health Literacy: Adequate Health Literacy (09/28/2023)   SDOH Interventions:     Readmission Risk Interventions    06/07/2023    4:40 PM 11/24/2021   10:03 AM 10/20/2021    3:50 PM  Readmission Risk Prevention Plan  Transportation Screening Complete Complete Complete  Medication Review Oceanographer) Complete Complete Complete  PCP or Specialist appointment within 3-5 days of discharge Complete Complete Complete  HRI or Home Care Consult Complete Complete Complete  SW Recovery Care/Counseling Consult Complete  Complete Complete  Palliative Care Screening Not Applicable Not Applicable Not Applicable  Skilled Nursing Facility Not Applicable Not Applicable Not Applicable

## 2024-03-01 ENCOUNTER — Other Ambulatory Visit (HOSPITAL_COMMUNITY): Payer: Self-pay

## 2024-03-01 NOTE — Progress Notes (Unsigned)
 Cardiology Office Note:    Date:  03/05/2024   ID:  [name], DOB [birth year], MRN [id number]  PCP:  Sebastian Beverley NOVAK, MD  Cardiologist:  Vinie JAYSON Maxcy, MD { Click to update primary MD,subspecialty MD or APP then REFRESH:1}    Referring MD: Sebastian Beverley NOVAK, MD   Chief Complaint: follow-up of CAD  History of Present Illness:    [name] is a 89 y.o. female with a history of CAD s/p multiple PCIs (BMS to RCA in 2004, DES x2 to mid and proximal RCA in 2012, and DES to RCA in 02/2020 in setting of NSTEMI), chronic HFpEF, hypertension, hyperlipidemia, type 2 diabetes mellitus, CKD stage IV, hyperkalemia, COPD, recurrent PE/ DVT, temporal arteritis, symptomatic anemia s/p multiple blood transfusions in 02/2024,  polymyalgia rheumatica, dementia, and obesity s/p remote gastric bypass who presents today for routine follow-up.  Patient has a complex history a detailed above. She has a history of CAD with multiple PCIs to the RCA in the past. Last ischemic evaluation was a LHC in 02/2020 during an admission for a NSTEMI. This showed multivessel CAD with severe (95%) in-stent re-stenosis in the proximal RCA and de novo severe stenosis (85%) of the mid to distal  RCA as well as mild to moderate disease in the distal left main, LCX, and LAD. She underwent successful PCI with DES to both the proximal and mid to distal RCA lesions. Last Echo in 06/2021 showed LVEF of 55-60% with grade 1 diastolic dysfunction, normal RV function, and no significant valvular disease.   She was last seen by Dr. Alveta in 04/2023 at which time she denied any chest pain or shortness of breath.   She has had multiple ED visits/ admission in 2025 mostly for non-cardiac issues including hyperkalemia, headaches secondary to temporal arteritis, hyperglycemic hyperosmolar state, volume overload, and symptomatic anemia requiring blood transfusions. During one of these admissions in 10/2023, she did report some chest  pain that improved with Nitroglycerin  in the setting of hyperglycemia. EKG showed no acute changes and high-sensitivity troponin was minimally elevated and flat. Chest pain resolved with treatment of her hyperglycemia.  She was not felt to be a cardiac catheterization candidate given her advanced kidney disease. Therefore, medical therapy was recommended.   She was admitted in 12/2023 at the [facility] for syncope in the setting of acute anemia with hemoglobin as low as 5.6. She was treated with a transfusion of PRBCs, IV iron , and PPI. EGD showed abnormal mucosa in the antrum suggesting gastritis as well as a large hiatal hernia but normal duodenum.  She was readmitted from 02/27/2024 to 02/29/2024 at [facility] for symptomatic anemia with hemoglobin as low as 5.9. She was treated with 2 unit of PRBCs. Hemoccult was positive. She declined repeat endoscopic procedures. Patient and family elected to continue to hold Eliquis  at discharge.   Patient presents today for follow-up. She is here today with her son and daughter. She states she feels terrible. She is not sleeping well at all. She states it is a good night if she gets 5 hours of sleep. She is not sure why she is not sleeping well. Patient states that she gets out of breath a lot and her daughter states that she has been having to sit up more due to shortness of breath but this does not seem to be the main reason that she is having trouble sleeping. She denies any PND or edema. She reports atypical chest pain  that she describes as a random sharp pain on the right side of her chest that only last a couple a seconds at a time and then resolves on its own. She is essentially wheelchair bound and this occurs at rest. She reports occasional heart fluttering/ racing that lasts for about 1 minute. She was having dizziness in the hospital in the setting of acute anemia but denies any since discharge. No syncope. She denies any obvious bleeding since  discharge but states she has not had bowel movement yet. Patient has been taking Eliquis  once daily. She has frequent headaches/ migraines.  She follows with Neurology for this and was recently started on Ubrelvy  which she feels helps some.   She also reports she has not been eating or drinking much. She does not have an appetite and is losing weight. Her weight is down 16 lbs since 01/16/2024. She has not seen her PCP since recent discharge.   EKGs/Labs/Other Studies Reviewed:    The following studies were reviewed:  Left Cardiac Catheterization 02/17/2020: LV end diastolic pressure is mildly elevated. ------------------------------ Mid LM to Prox LAD lesion is 30% stenosed with 30% stenosed side branch in Ost Cx. Prox LAD to Mid LAD lesion is 45% stenosed with 25% stenosed side branch in 1st Diag. 1st Mrg lesion is 100% stenosed. ---------------- Lida RCA to Prox RCA stent is 10% stenosed. Prox RCA to Mid RCA stent is 5% stenosed. Prox RCA lesion is 95% stenosed, between the 2 old stents A drug-eluting stent was successfully placed using a STENT RESOLUTE ONYX 4.0X18 -> postdilated to 4.6-4.7 mm Post intervention, there is a 0% residual stenosis. ---------------- Mid RCA lesion is 85% stenosed after the second stent A drug-eluting stent was successfully placed using a STENT RESOLUTE ONYX 3.0X18 -> tapered post dilation from 4.2-3.3 mm Post intervention, there is a 0% residual stenosis.   Summary Multivessel CAD with severe (95%) IntraStent stenosis in the proximal RCA and de novo stenosis distal to mid RCA stent (85%) -> both treated with DES PCI using Resolute Onyx DES stents (distal-3.0 mm x 18 mm with tapered post dilation 4.2-3.3 mm; proximal 4.0 mm 18 mm postdilated to 4.7 mm.) Diffuse mild to moderate calcified disease in the distal Left Main as well as ostial and proximal LCx and LAD with no severe stenoses. Borderline elevated LVEDP with severe systemic  hypertension  Diagnostic Dominance: Right  Intervention   _______________  Echocardiogram 07/05/2021: Impressions: 1. Left ventricular ejection fraction, by estimation, is 55 to 60%. The  left ventricle has normal function. Left ventricular endocardial border  not optimally defined to evaluate regional wall motion- there are no wall  motion abnormalities seen in views  obtained. Left ventricular diastolic parameters are consistent with Grade  I diastolic dysfunction (impaired relaxation).   2. Right ventricular systolic function is normal. The right ventricular  size is normal.   3. The mitral valve was not well visualized. No evidence of mitral valve  regurgitation. No evidence of mitral stenosis.   4. The aortic valve was not well visualized. Aortic valve regurgitation  is not visualized.     EKG:  EKG not ordered today.   Recent Labs: 06/08/2023: Magnesium  1.4 11/23/2023: Pro Brain Natriuretic Peptide 688.0 11/28/2023: B Natriuretic Peptide 94.8 02/28/2024: ALT 10; BUN 91; Creatinine, Ser 2.48; Potassium 5.1; Sodium 137 02/29/2024: Hemoglobin 8.2; Platelets 283  Recent Lipid Panel    Component Value Date/Time   CHOL 81 (L) 03/05/2023 1236   TRIG 74 03/05/2023 1236  HDL 31 (L) 03/05/2023 1236   CHOLHDL 2.6 03/05/2023 1236   CHOLHDL 2.7 01/26/2021 1405   VLDL 17 02/15/2020 0636   LDLCALC 34 03/05/2023 1236   LDLCALC 42 01/26/2021 1405    Physical Exam:    Vital Signs: BP 111/72 (BP Location: Left Arm, Patient Position: Sitting, Cuff Size: Normal)   Pulse 77   Ht 5' 7 (1.702 m)   Wt 244 lb 12.8 oz (111 kg)   SpO2 99%   BMI 38.34 kg/m     Wt Readings from Last 3 Encounters:  03/05/24 244 lb 12.8 oz (111 kg)  02/27/24 275 lb (124.7 kg)  01/16/24 268 lb 1.3 oz (121.6 kg)     General: 89 y.o. female in no acute distress. HEENT: Normocephalic and atraumatic. Sclera clear.  Neck: Supple. No JVD. Heart: RRR. Distinct S1 and S2. No murmurs, gallops, or rubs.   Lungs: No increased work of breathing. Mild crackles in bilateral bases that essentially resolved with coughing. No wheezes or rhonchi.  Extremities: No lower extremity edema.   Skin: Warm and dry. Decreased skin turgor.  Neuro: No focal deficits. Psych: Normal affect. Responds appropriately.   Assessment:    1. Coronary artery disease involving native coronary artery of native heart without angina pectoris   2. Chronic heart failure with preserved ejection fraction (HFpEF) (HCC)   3. Primary hypertension   4. Hyperlipidemia, unspecified hyperlipidemia type   5. Type 2 diabetes mellitus with complication, with long-term current use of insulin  (HCC)   6. Chronic kidney disease (CKD), stage IV (severe) (HCC)   7. History of pulmonary embolism   8. History of DVT (deep vein thrombosis)   9. Chronic anemia   10. History of GI bleed     Plan:    CAD  S/p multiple PCI to the RCA in the past, most recently in 02/2020 in the setting of NSTEMI.  - She describes occasional episodes of very atypical chest pain that does not sound cardiac in nature.  - She was previously not on Aspirin  because she was on Eliquis . Eliquis  now on hold given severe anemia requiring multiple blood transfusions. Would not restart Aspirin  for this reason.  - Continue Lipitor  80mg  daily.  - No additional ischemic work-up necessary.   Chronic HFpEF Last Echo in 06/2021 showed  LVEF of 55-60% with grade 1 diastolic dysfunction, normal RV function, and no significant valvular disease.  - She reports worsening shortness of breath that she describes as feeling out of breath a lot. She had some mild crackles in bilateral bases at first but these cleared with coughing. Her weight is down 16 lbs since 01/16/2024 and she actually looks more dry on exams (lips are cracked and she has decreased skin turgor).  - Currently on Torsemide  20mg  daily with instructions to take an extra dose as needed for weight gain or edema. Will  continue for now but may need to adjust depending on what today's labs show.  - No Spironolactone given renal function and hyperkalemia.  - No SGLT2 inhibitor given renal function.  - I do not think her dyspnea is due to CHF given weight loss and the fact that she looks more dry on exam. Last chest CT in 11/2023 showed ***  Hypertension  BP *** - Not currently on any antihypertensives.   Hyperlipidemia LDL 60 in 2021.  - Continue Lipitor  80mg  daily.   Type 2 Diabetes Mellitus Hemoglobin A1c 7.4% in 12/2023.  - On Insulin .  - Management  per PCP.   CKD Stage IV Baseline creatinine around 2.3 to 2.8.  - Followed by Nephrology. ***  History of PE/ DVT She has a history of recurrent PE/ DVT. She was previously on chronic anticoagulation for this with Eliquis  but it was stopped during recent hospitalization for severe anemia.  - Management per PCP.   Chronic Anemia History of GI Bleed She has had 2 recent admission for severe anemia with hemoglobin <6.0 requiring blood transfusions. EGD in 12/2023 was suggestive of gastritis. Hemoglobin was 8.2 on recent discharge.  - *** - Management per PCP.   Disposition: Follow up in ***   Signed, Aline FORBES Door, PA-C  03/05/2024 2:13 PM    [facility]

## 2024-03-02 ENCOUNTER — Other Ambulatory Visit (HOSPITAL_COMMUNITY): Payer: Self-pay

## 2024-03-04 ENCOUNTER — Telehealth: Payer: Self-pay | Admitting: Hematology

## 2024-03-05 ENCOUNTER — Telehealth: Payer: Self-pay

## 2024-03-05 ENCOUNTER — Encounter: Payer: Self-pay | Admitting: Student

## 2024-03-05 ENCOUNTER — Other Ambulatory Visit: Payer: Self-pay | Admitting: Hematology

## 2024-03-05 ENCOUNTER — Ambulatory Visit: Attending: Cardiology | Admitting: Student

## 2024-03-05 ENCOUNTER — Ambulatory Visit: Attending: Student

## 2024-03-05 ENCOUNTER — Other Ambulatory Visit: Payer: Self-pay | Admitting: Family Medicine

## 2024-03-05 VITALS — BP 111/72 | HR 77 | Ht 67.0 in | Wt 252.0 lb

## 2024-03-05 DIAGNOSIS — Z8719 Personal history of other diseases of the digestive system: Secondary | ICD-10-CM

## 2024-03-05 DIAGNOSIS — E785 Hyperlipidemia, unspecified: Secondary | ICD-10-CM

## 2024-03-05 DIAGNOSIS — E118 Type 2 diabetes mellitus with unspecified complications: Secondary | ICD-10-CM

## 2024-03-05 DIAGNOSIS — I5032 Chronic diastolic (congestive) heart failure: Secondary | ICD-10-CM

## 2024-03-05 DIAGNOSIS — Z86711 Personal history of pulmonary embolism: Secondary | ICD-10-CM

## 2024-03-05 DIAGNOSIS — Z794 Long term (current) use of insulin: Secondary | ICD-10-CM

## 2024-03-05 DIAGNOSIS — I10 Essential (primary) hypertension: Secondary | ICD-10-CM | POA: Diagnosis not present

## 2024-03-05 DIAGNOSIS — I2510 Atherosclerotic heart disease of native coronary artery without angina pectoris: Secondary | ICD-10-CM | POA: Diagnosis not present

## 2024-03-05 DIAGNOSIS — Z86718 Personal history of other venous thrombosis and embolism: Secondary | ICD-10-CM | POA: Diagnosis not present

## 2024-03-05 DIAGNOSIS — R002 Palpitations: Secondary | ICD-10-CM

## 2024-03-05 DIAGNOSIS — R0602 Shortness of breath: Secondary | ICD-10-CM

## 2024-03-05 DIAGNOSIS — D649 Anemia, unspecified: Secondary | ICD-10-CM | POA: Diagnosis not present

## 2024-03-05 DIAGNOSIS — K219 Gastro-esophageal reflux disease without esophagitis: Secondary | ICD-10-CM

## 2024-03-05 DIAGNOSIS — N184 Chronic kidney disease, stage 4 (severe): Secondary | ICD-10-CM

## 2024-03-05 NOTE — Progress Notes (Unsigned)
 Enrolled patient for a 14 day Zio XT monitor to be mailed to patients home  [place]

## 2024-03-05 NOTE — Telephone Encounter (Signed)
 Requesting: pantoprazole  (PROTONIX ) 20 MG tablet Last Visit: 02/20/2024 Next Visit: 05/23/2024 Last Refill: 02/27/2024   Message on refill stated The original prescription was discontinued on 02/29/2024 by Rosario Leatrice FERNS, MD for the following reason: Stop Taking at Discharge. Renewing this prescription may not be appropriate   Please Advise

## 2024-03-05 NOTE — Patient Instructions (Addendum)
 Thank you for choosing [facility]!     Medication Instructions:  No medication changes were made during today's visit.  *If you need a refill on your cardiac medications before your next appointment, please call your pharmacy*   Lab Work: BMET, CBC, BNP, MAG If you have labs (blood work) drawn today and your tests are completely normal, you will receive your results only by: MyChart Message (if you have MyChart) OR A paper copy in the mail If you have any lab test that is abnormal or we need to change your treatment, we will call you to review the results.   Testing/Procedures:  ZIO XT- Long Term Monitor Instructions   Your physician has requested you wear your ZIO patch monitor 14 days.   This is a single patch monitor.  [employer] supplies one patch monitor per enrollment.  Additional stickers are not available.   Please do not apply patch if you will be having a Nuclear Stress Test, Echocardiogram, Cardiac CT, MRI, or Chest Xray during the time frame you would be wearing the monitor. The patch cannot be worn during these tests.  You cannot remove and re-apply the ZIO XT patch monitor.   Your ZIO patch monitor will be sent USPS Priority mail from [facility] directly to your home address. The monitor may also be mailed to a PO BOX if home delivery is not available.   It may take 3-5 days to receive your monitor after you have been enrolled.   Once you have received you monitor, please review enclosed instructions.  Your monitor has already been registered assigning a specific monitor serial # to you.   Applying the monitor   Shave hair from upper left chest.   Hold abrader disc by orange tab.  Rub abrader in 40 strokes over left upper chest as indicated in your monitor instructions.   Clean area with 4 enclosed alcohol pads .  Use all pads to assure are is cleaned thoroughly.  Let dry.   Apply patch as indicated in monitor instructions.  Patch will be place  under collarbone on left side of chest with arrow pointing upward.   Rub patch adhesive wings for 2 minutes.Remove white label marked 1.  Remove white label marked 2.  Rub patch adhesive wings for 2 additional minutes.   While looking in a mirror, press and release button in center of patch.  A small green light will flash 3-4 times .  This will be your only indicator the monitor has been turned on.     Do not shower for the first 24 hours.  You may shower after the first 24 hours.   Press button if you feel a symptom. You will hear a small click.  Record Date, Time and Symptom in the Patient Log Book.   When you are ready to remove patch, follow instructions on last 2 pages of Patient Log Book.  Stick patch monitor onto last page of Patient Log Book.   Place Patient Log Book in [place] box.  Use locking tab on box and tape box closed securely.  The [employer] has [employer] on it.  Please place in mailbox as soon as possible.  Your physician should have your test results approximately 7 days after the monitor has been mailed back to [facility].   Call [facility] Customer Care at [phone] if you have questions regarding your ZIO XT patch monitor.  Call them immediately if you see an orange  light blinking on your monitor.   If your monitor falls off in less than 4 days contact our Monitor department at [phone].  If your monitor becomes loose or falls off after 4 days call [employer] at [phone] for suggestions on securing your monitor.    Your next appointment:   3 month(s)   Provider:   Vinie JAYSON Maxcy, MD OR Aline Door    Follow-Up: At [facility], you and your health needs are our priority.  As part of our continuing mission to provide you with exceptional heart care, we have created designated Provider Care Teams.  These Care Teams include your primary Cardiologist (physician) and Advanced Practice Providers (APPs -  Physician  Assistants and Nurse Practitioners) who all work together to provide you with the care you need, when you need it. We recommend signing up for the patient portal called MyChart.  Sign up information is provided on this After Visit Summary.  MyChart is used to connect with patients for Virtual Visits (Telemedicine).  Patients are able to view lab/test results, encounter notes, upcoming appointments, etc.  Non-urgent messages can be sent to your provider as well.   To learn more about what you can do with MyChart, go to [email or web address].

## 2024-03-05 NOTE — Progress Notes (Signed)
   03/05/2024  Patient ID: [name], female   DOB: Oct 09, 1934, 89 y.o.   MRN: [id number]  Returning missed call/voicemail from patient's daughter, Sharlet ([place]).  Patient is hoping to get some financial assistance with a bill received from another healthcare system.  I advised that she call that healthcare system to see if they offer patient financial assistance.  She endorsed understanding and plans to reach out.  [name], PharmD, DPLA

## 2024-03-06 ENCOUNTER — Encounter (HOSPITAL_COMMUNITY): Payer: Self-pay

## 2024-03-06 ENCOUNTER — Encounter: Payer: Self-pay | Admitting: Hematology

## 2024-03-06 ENCOUNTER — Encounter: Payer: Self-pay | Admitting: Student

## 2024-03-06 ENCOUNTER — Telehealth: Payer: Self-pay

## 2024-03-06 ENCOUNTER — Other Ambulatory Visit: Payer: Self-pay | Admitting: Hematology

## 2024-03-06 NOTE — Telephone Encounter (Signed)
 Dr. Onesimo, patient will be scheduled as soon as possible.  Auth Submission: NO AUTH NEEDED Site of care: Site of care: [facility] Payer: [employer] Medication & CPT/J Code(s) submitted: Feraheme  (ferumoxytol ) [id number] Diagnosis Code:  Route of submission (phone, fax, portal):  Phone # Fax # Auth type: Buy/Bill PB Units/visits requested: 510mg  x 2 doses Reference number:  Approval from: 03/06/24 to 05/07/24

## 2024-03-07 MED FILL — Iron Sucrose Inj 20 MG/ML (Fe Equiv): Qty: 265 | Status: AC

## 2024-03-08 ENCOUNTER — Inpatient Hospital Stay: Attending: Internal Medicine

## 2024-03-08 ENCOUNTER — Ambulatory Visit: Payer: Self-pay | Admitting: Student

## 2024-03-08 VITALS — BP 118/59 | HR 73 | Temp 97.9°F | Resp 18

## 2024-03-08 DIAGNOSIS — N184 Chronic kidney disease, stage 4 (severe): Secondary | ICD-10-CM | POA: Diagnosis not present

## 2024-03-08 DIAGNOSIS — D631 Anemia in chronic kidney disease: Secondary | ICD-10-CM | POA: Insufficient documentation

## 2024-03-08 DIAGNOSIS — D509 Iron deficiency anemia, unspecified: Secondary | ICD-10-CM

## 2024-03-08 LAB — BASIC METABOLIC PANEL WITH GFR
BUN/Creatinine Ratio: 33 — ABNORMAL HIGH (ref 12–28)
BUN: 80 mg/dL (ref 8–27)
CO2: 19 mmol/L — ABNORMAL LOW (ref 20–29)
Calcium: 8.7 mg/dL (ref 8.7–10.3)
Chloride: 104 mmol/L (ref 96–106)
Creatinine, Ser: 2.46 mg/dL — ABNORMAL HIGH (ref 0.57–1.00)
Glucose: 145 mg/dL — ABNORMAL HIGH (ref 70–99)
Potassium: 4.8 mmol/L (ref 3.5–5.2)
Sodium: 140 mmol/L (ref 134–144)
eGFR: 19 mL/min/1.73 — ABNORMAL LOW (ref >59)

## 2024-03-08 LAB — CBC
Hematocrit: 25.7 % — ABNORMAL LOW (ref 34.0–46.6)
Hemoglobin: 7.9 g/dL — ABNORMAL LOW (ref 11.1–15.9)
MCH: 28.2 pg (ref 26.6–33.0)
MCHC: 30.7 g/dL — ABNORMAL LOW (ref 31.5–35.7)
MCV: 92 fL (ref 79–97)
Platelets: 273 x10E3/uL (ref 150–450)
RBC: 2.8 x10E6/uL — ABNORMAL LOW (ref 3.77–5.28)
RDW: 13.8 % (ref 11.7–15.4)
WBC: 11.7 x10E3/uL — ABNORMAL HIGH (ref 3.4–10.8)

## 2024-03-08 LAB — BRAIN NATRIURETIC PEPTIDE: BNP: 29.5 pg/mL (ref 0.0–100.0)

## 2024-03-08 LAB — MAGNESIUM: Magnesium: 1.6 mg/dL (ref 1.6–2.3)

## 2024-03-08 MED ORDER — ACETAMINOPHEN 325 MG PO TABS
650.0000 mg | ORAL_TABLET | Freq: Once | ORAL | Status: AC
  Administered 2024-03-08: 650 mg via ORAL
  Filled 2024-03-08: qty 2

## 2024-03-08 MED ORDER — IRON SUCROSE 300 MG IVPB - SIMPLE MED
300.0000 mg | Freq: Once | Status: AC
  Administered 2024-03-08: 300 mg via INTRAVENOUS
  Filled 2024-03-08: qty 265

## 2024-03-08 MED ORDER — LORATADINE 10 MG PO TABS
10.0000 mg | ORAL_TABLET | Freq: Once | ORAL | Status: AC
  Administered 2024-03-08: 10 mg via ORAL
  Filled 2024-03-08: qty 1

## 2024-03-08 NOTE — Patient Instructions (Signed)

## 2024-03-11 ENCOUNTER — Telehealth: Payer: Self-pay | Admitting: Family Medicine

## 2024-03-11 NOTE — Telephone Encounter (Signed)
 Patient dropped off document Hospice order form, to be filled out by provider. Patient requested to send it back via Fax within ASAP. Document is located in providers tray at front office.Please advise at Mobile [phone] (mobile)

## 2024-03-12 NOTE — Telephone Encounter (Signed)
 Paper work was collected by [employer] and placed in provider sign/review folder on 03/12/2024

## 2024-03-13 NOTE — Telephone Encounter (Signed)
 Signed and returned to [employer].

## 2024-03-13 NOTE — Telephone Encounter (Signed)
 Charge sheet was given to [employer]

## 2024-03-13 NOTE — Telephone Encounter (Signed)
 Paperwork was faxed with confirmation on 03/13/2024 @4 :30pm

## 2024-03-13 NOTE — Telephone Encounter (Unsigned)
 Copied from CRM [id number]. Topic: General - Other >> Mar 13, 2024 10:27 AM Viola F wrote: Reason for CRM: Trinna from [facility] called to follow up on hospice order that was dropped off 03/11/24. Please fax back to [phone] as soon as possible due to family waiting and call Trinna at [phone] with update.

## 2024-03-17 NOTE — Progress Notes (Signed)
 Left vm for pt to cb regarding lab results

## 2024-03-18 ENCOUNTER — Telehealth: Payer: Self-pay

## 2024-03-19 NOTE — Telephone Encounter (Signed)
 Patient was returning call. Please advise ?

## 2024-03-21 MED FILL — Iron Sucrose Inj 20 MG/ML (Fe Equiv): Qty: 265 | Status: AC

## 2024-03-22 ENCOUNTER — Inpatient Hospital Stay

## 2024-03-23 DIAGNOSIS — M25552 Pain in left hip: Secondary | ICD-10-CM | POA: Diagnosis not present

## 2024-03-23 DIAGNOSIS — D500 Iron deficiency anemia secondary to blood loss (chronic): Secondary | ICD-10-CM | POA: Diagnosis not present

## 2024-03-23 DIAGNOSIS — M160 Bilateral primary osteoarthritis of hip: Secondary | ICD-10-CM | POA: Diagnosis not present

## 2024-03-23 DIAGNOSIS — N184 Chronic kidney disease, stage 4 (severe): Secondary | ICD-10-CM | POA: Diagnosis not present

## 2024-03-23 DIAGNOSIS — M25562 Pain in left knee: Secondary | ICD-10-CM | POA: Diagnosis not present

## 2024-03-23 DIAGNOSIS — E1122 Type 2 diabetes mellitus with diabetic chronic kidney disease: Secondary | ICD-10-CM | POA: Diagnosis not present

## 2024-03-23 DIAGNOSIS — I2510 Atherosclerotic heart disease of native coronary artery without angina pectoris: Secondary | ICD-10-CM | POA: Diagnosis not present

## 2024-03-23 DIAGNOSIS — D62 Acute posthemorrhagic anemia: Secondary | ICD-10-CM | POA: Diagnosis not present

## 2024-03-23 DIAGNOSIS — M1712 Unilateral primary osteoarthritis, left knee: Secondary | ICD-10-CM | POA: Diagnosis not present

## 2024-03-23 DIAGNOSIS — K921 Melena: Secondary | ICD-10-CM | POA: Diagnosis not present

## 2024-03-23 DIAGNOSIS — D649 Anemia, unspecified: Secondary | ICD-10-CM | POA: Diagnosis not present

## 2024-03-23 DIAGNOSIS — M25462 Effusion, left knee: Secondary | ICD-10-CM | POA: Diagnosis not present

## 2024-03-23 DIAGNOSIS — B9620 Unspecified Escherichia coli [E. coli] as the cause of diseases classified elsewhere: Secondary | ICD-10-CM | POA: Diagnosis not present

## 2024-03-23 DIAGNOSIS — Z6841 Body Mass Index (BMI) 40.0 and over, adult: Secondary | ICD-10-CM | POA: Diagnosis not present

## 2024-03-23 DIAGNOSIS — I5032 Chronic diastolic (congestive) heart failure: Secondary | ICD-10-CM | POA: Diagnosis not present

## 2024-03-23 DIAGNOSIS — M48062 Spinal stenosis, lumbar region with neurogenic claudication: Secondary | ICD-10-CM | POA: Diagnosis not present

## 2024-03-23 DIAGNOSIS — K922 Gastrointestinal hemorrhage, unspecified: Secondary | ICD-10-CM | POA: Diagnosis not present

## 2024-03-23 DIAGNOSIS — I509 Heart failure, unspecified: Secondary | ICD-10-CM | POA: Diagnosis not present

## 2024-03-23 DIAGNOSIS — R079 Chest pain, unspecified: Secondary | ICD-10-CM | POA: Diagnosis not present

## 2024-03-23 DIAGNOSIS — M47816 Spondylosis without myelopathy or radiculopathy, lumbar region: Secondary | ICD-10-CM | POA: Diagnosis not present

## 2024-03-23 DIAGNOSIS — N189 Chronic kidney disease, unspecified: Secondary | ICD-10-CM | POA: Diagnosis not present

## 2024-03-23 DIAGNOSIS — I130 Hypertensive heart and chronic kidney disease with heart failure and stage 1 through stage 4 chronic kidney disease, or unspecified chronic kidney disease: Secondary | ICD-10-CM | POA: Diagnosis not present

## 2024-03-23 DIAGNOSIS — E785 Hyperlipidemia, unspecified: Secondary | ICD-10-CM | POA: Diagnosis not present

## 2024-03-23 NOTE — H&P (Signed)
 [facility] Hospitalist  HISTORY AND PHYSICAL Epic Chat Secure Text preferred      Name:  [name] DOB/Age/Sex: 31-Mar-1935  (89 y.o. female)  MRN & CSN:  [id number] & [id number] Encounter Date/Time:  03/23/24 / 6:11 PM  Location:  C23/C23 PCP: Nichole KATHEE Hummer, MD      History from:   HISTORY SOURCE: chart review and patient reports  History of Present Illness:   Chief Complaint: Left leg and foot pain, intermittent shortness of breath  HPI: The patient is a 89 year old black female with medical problems consisting of type 2 diabetes  insulin -dependent, diabetic neuropathy, hypertension, hyperlipidemia, chronic kidney disease stage IV, chronic anemia, iron  deficiency, hiatal hernia, GERD, scalp chronic diastolic heart failure, coronary artery disease history of MI, dementia, migraines, depression anxiety, history of GI bleed, spinal stenosis, COPD, obesity, history of breast cancer, DVT right leg and PE on anticoagulation, history of gastric bypass in the 70s.  The patient is visiting from [facility], she follows with cardiology, nephrology, gastroenterology, oncology, primary care.  The patient had admission in August of this year for acute on chronic anemia required blood transfusion, upper endoscopy was unremarkable.  The patient reports receiving blood transfusions every few months, she reports  receiving iron  infusions every few months.  The patient reports having a lot of pain in her left leg and her foot, worse than her normal that is what brought her into the hospital.  She does report intermittent shortness of breath, occasional chest discomfort, fatigue, no abdominal pain.  States, she reports taking melena stools for the past few months.  The patient has been taking Eliquis .  She reports recent treatment for urinary tract infection finishing antibiotics probably a week ago.  The patient wants to continue her DNR status.  The patient also does not want to have dialysis.  In the  emergency department the patient x-ray of the left knee reporting moderate degenerative joint disease without acute findings.  X-ray of the pelvis with degenerative changes with no acute findings.  Chest x-ray no acute findings.  Venous Doppler study of the left leg preliminary results negative for DVT.  Hemoglobin 5.0, proceed to units packed red blood cells.  Leukocytosis 16.9 going to obtain a UA and urine culture, chest x-ray was negative for acute process.  Chemistry is unremarkable patient is at her baseline kidney function, potassium stable at 5.0.  LFTs negative.  Troponins negative.    Review of Systems:   Review of Systems Currently the patient reports no chest pain, no worsening shortness of breath, no abdominal pain, no lightheadedness or dizziness, no pain with urination, she does report urinary frequency and some foul odor, no constipation, no diarrhea, no nausea or vomiting Pertinent positives and negatives discussed in HPI    Past Medical History:  PMHx Past Medical History[1] PSHX:  has a past surgical history that includes Cesarean section, classic (1960). Allergies: Sulfa antibiotics, Duloxetine , Sodium zirconium cyclosilicate , Sulfamethoxazole-trimethoprim, Codeine, Donepezil  hcl, and Tramadol  Fam HX: family history is not on file. Soc HX:  reports that she has never smoked. She has never used smokeless tobacco. She reports that she does not currently use alcohol. She reports that she does not use drugs.  Medications Prior to Admission  Home medications reviewed. Prior to Admission medications  Medication Sig Start Date End Date Taking? Authorizing Provider  amitriptyline  (Elavil ) 75 MG tablet Take 75 mg by mouth at bedtime. 12/06/23  Yes Historical Provider, MD  atorvastatin  (Lipitor ) 80 MG tablet Take 80  mg by mouth in the evening. 11/01/23  Yes Historical Provider, MD  carvedilol  (Coreg ) 12.5 MG tablet Take 12.5 mg by mouth 2 times a day. 11/01/23  Yes Historical Provider,  MD  Eliquis  5 MG tablet Take 5 mg by mouth 2 times a day. 04/09/23  Yes Historical Provider, MD  febuxostat  (Uloric ) 40 MG tablet Take 40 mg by mouth in the morning. 12/24/23  Yes Historical Provider, MD  folic acid  (Folvite ) 1 MG tablet Take 1,000 mcg by mouth in the morning. 11/01/23  Yes Historical Provider, MD  gabapentin  (Neurontin ) 100 MG capsule Take 100 mg by mouth at bedtime. 12/06/23  Yes Historical Provider, MD  insulin  aspart (NovoLOG  FLEXPEN) 100 UNIT/ML pen Inject 14 Units under the skin in the morning and 14 Units in the evening. Inject before meals. Do not exceed 70 units per 24 hours.   Yes Historical Provider, MD  meclizine  (Antivert ) 25 MG tablet Take 25 mg by mouth 2 times a day. 12/06/23  Yes Historical Provider, MD  memantine  (Namenda ) 5 MG tablet Take 10 mg by mouth in the morning. 10/11/23  Yes Historical Provider, MD  memantine  (Namenda ) 5 MG tablet Take 5 mg by mouth at bedtime.   Yes Historical Provider, MD  pantoprazole  (Protonix ) 20 MG EC tablet Take 2 tablets by mouth in the morning. Patient taking differently: Take 20 mg by mouth in the morning. 12/28/23  Yes Alric Monas, MD  torsemide  (Demadex ) 20 MG tablet Take 20 mg by mouth in the morning. 12/12/23  Yes Historical Provider, MD  Toujeo  Max SoloStar, 2 unit dial , 300 UNIT/ML injection pen Inject 36 Units under the skin every morning. 12/13/23  Yes Historical Provider, MD  ergocalciferol  (Vitamin D2) 1.25 MG (50000 UT) capsule Take 1.25 mg by mouth 1 time each week. Takes on Monday 11/30/23   Historical Provider, MD  fluticasone  (Flonase ) 50 MCG/ACT nasal spray Administer 2 sprays into affected nostril(s) daily as needed for allergies. 12/06/23   Historical Provider, MD  mupirocin (Bactroban) 2 % ointment Apply 1 Application topically in the morning and 1 Application in the evening and 1 Application before bedtime. 12/19/23   Historical Provider, MD  nitroglycerin  (Nitrostat ) 0.4 MG SL tablet Place 0.4 mg under the tongue every 5  minutes as needed for chest pain. 09/20/23   Historical Provider, MD  Ubrelvy  50 MG tablet Take 50 mg by mouth daily as needed. TAKE ONE TABLET BY MOUTH AT ONSET OF MIGRAINE. IF SYMPTOMS PERSIST, A SECOND DOSE MAY BE TAKEN IN 2 HOURS. DO NOT EXCEED 2 DOSES IN A 24 HOUR PERIOD, UNLESS OTHERWISE INSTRUCTED BY YOUR PHYSICIAN 03/03/24   Historical Provider, MD     Objective:  No intake or output data in the 24 hours ending 03/23/24 1811  Vitals:  Vitals:   03/23/24 1013 03/23/24 1300 03/23/24 1505 03/23/24 1520  BP: (!) 101/40 106/62 (!) 115/49 90/55  BP Location: Right arm Right arm  Right arm  Patient Position: Lying Lying  Lying  Pulse: 66 71 68 74  Resp: 12 21 12 14   Temp:   36.4 C (97.5 F) 36.5 C (97.7 F)  TempSrc:    Oral  SpO2: 100% 100%  100%    Physical Exam:    Physical Exam Constitutional:      General: She is not in acute distress.    Appearance: She is not ill-appearing or toxic-appearing.  Cardiovascular:     Rate and Rhythm: Normal rate.     Heart sounds: Normal  heart sounds.  Pulmonary:     Effort: Pulmonary effort is normal.     Breath sounds: Normal breath sounds.  Abdominal:     General: Bowel sounds are normal. There is no distension.     Palpations: Abdomen is soft.     Tenderness: There is no abdominal tenderness. There is no guarding.  Skin:    General: Skin is warm and dry.  Neurological:     Mental Status: She is alert and oriented to person, place, and time. Mental status is at baseline.  Psychiatric:        Mood and Affect: Mood normal.        Behavior: Behavior normal.        Thought Content: Thought content normal.        Judgment: Judgment normal.       Labs   Labs: Personally reviewed by me today  Recent Results (from the past 48 hours)  CBC auto differential   Collection Time: 03/23/24 10:39 AM  Result Value Ref Range   WBC 16.9 (H) 4.8 - 10.8 10*3/uL   RBC 1.74 (L) 4.20 - 5.40 10*6/uL   Hemoglobin 5.0 (LL) 12.0 - 16.0 g/dL    Hematocrit 83.6 (L) 38.0 - 47.0 %   MCV 93.7 80.0 - 94.0 fL   MCH 28.7 27.0 - 34.0 pg   MCHC 30.7 (L) 31.5 - 36.0 g/dL   RDW 82.5 (H) 88.4 - 85.4 %   MPV 11.0 (H) 7.4 - 10.4 fL   Platelets 254 130 - 400 10*3/uL   Neutrophils Relative 72.9 (H) 34.6 - 71.4 %   Neutrophils Absolute 12.3 (H) 1.8 - 7.3 10*3/uL   Lymphocytes Relative 18.9 (L) 19.6 - 52.7 %   Lymphocytes Absolute 3.2 1.5 - 4.0 10*3/uL   Monocytes Relative 4.7 2.4 - 11.8 %   Monocytes Absolute 0.8 0.2 - 1.0 10*3/uL   Eosinophils Relative 2.4 0.0 - 7.8 %   Eosinophils Absolute 0.4 0.0 - 0.7 10*3/uL   Basophils Relative 0.2 0.0 - 1.8 %   Basophils Absolute 0.0 0.0 - 0.2 10*3/uL  Comprehensive metabolic panel   Collection Time: 03/23/24 10:39 AM  Result Value Ref Range   Sodium 139 136 - 145 mmol/L   Potassium 5.0 3.4 - 5.1 mmol/L   Chloride 105 98 - 107 mmol/L   CO2 24 20 - 31 mmol/L   Anion Gap 10 5 - 15 mmol/L   BUN 88 (H) 9 - 23 mg/dL   Creatinine 7.28 (H) 9.44 - 1.02 mg/dL   BUN/Creatinine Ratio 32    Glucose 170 (H) 74 - 106 mg/dL   Calcium  8.8 8.7 - 10.4 mg/dL   AST 11 (L) 15 - 34 U/L   ALT (SGPT) 9 (L) 10 - 49 U/L   Alkaline Phosphatase 84 46 - 116 U/L   Albumin  2.9 (L) 3.4 - 5 g/dL   Globulin, Total 3.6 1.5 - 4.5 g/dL   A/G Ratio 0.8 (L) 1.1 - 1.8   Total Bilirubin <0.2 (L) 0.2 - 1.2 mg/dL   eGFR 17 (L) >=39 fO/fpw/8.26f*7   Total Protein 6.5 5.7 - 8.2 g/dL   Osmolality Calc 692 mosm/kg  B-type natriuretic peptide   Collection Time: 03/23/24 10:39 AM  Result Value Ref Range   BNP 57 0 - 100 pg/mL  HS TROPONIN, 4HR PANEL   Collection Time: 03/23/24 10:39 AM  Result Value Ref Range   HS TROPONIN, TIMED 0HR 9.34 <=34 ng/L  Manual differential   Collection  Time: 03/23/24 10:39 AM  Result Value Ref Range   Neutrophils % 76 (H) 43 - 72 %   Lymphocytes % 18 17 - 44 %   Monocytes % 4 (L) 5 - 12 %   Eosinophils % 2 1 - 8 %   Polychromasia Slight (A) (none)   Hypochromia Slight (A) (none)   Smudge Cells 3  per 100 WBCs   Bands % 1 %   Platelet Estimate Normal Normal  HS TROPONIN, 2HR   Collection Time: 03/23/24 12:45 PM  Result Value Ref Range   TROPONIN 2HR 9.28 <=34 ng/L   Troponin I, High Sensitivity Delta -0.06 <=20 ng/L  Type and screen   Collection Time: 03/23/24 12:45 PM  Result Value Ref Range   BLOOD COMPONENT TYPE RED CELL GROUP    CROSSMATCH EXPIRATION 03/26/2024,2359    ABORh A POSITIVE    Antibody Screen NEGATIVE    TS CONVERTED TO XM CONVERTED TO CROSSMATCH    CALLED TO AMANDA VALEU RN AT 1346 ON 03/23/2024   Prepare RBC   Collection Time: 03/23/24 12:45 PM  Result Value Ref Range   Unit Number [id number]    BLOOD COMPONENT TYPE LEUKO-POOR RED CELLS    BLOOD PRODUCT UNIT DIVISION 00    Dispense Status TRANSFUSED    BLOOD PRODUCT TRANSFUSION STATUS OK TO TRANSFUSE    CROSSMATCH RESULT Electronically Compatible    Unit Number [id number]    BLOOD COMPONENT TYPE LEUKO-POOR RED CELLS    BLOOD PRODUCT UNIT DIVISION 00    Dispense Status ALLOCATED    BLOOD PRODUCT TRANSFUSION STATUS OK TO TRANSFUSE    CROSSMATCH RESULT Electronically Compatible       Imaging/Diagnostics Last 24 Hours   ECG 12 lead                                     [facility]                    [address] [place], [place] [address]                                        Test Date:    2024-03-23 Bruna Name:     JACKQUELYN GASKINS            Department:   Emergency Department Patient ID:   [id number]                   Room:         C23 Gender:       Female                   Technician:   sp DOB:          July 05, 1934               Requested By: HUNTER LEONTINE CROME Order Number: [id number]                 Reading MD:    Lani Senters                                  Measurements Intervals  Axis           Rate:         69                       P:            55 PR:           234                      QRS:          34 QRSD:         98                       T:            87 QT:            444                                     QTc:          475                                                                Interpretive Statements Sinus rhythm with 1st degree AV block with premature atrial complexes with aberrant conduction Compared to ECG 12/26/2023 14:26:40 Atrial premature complex(es) now present Aberrant conduction of supraventricular beat(s) now present Possible ischemia no longer present Electronically Signed On 03-23-2024 13:01:02 EST by  Lani Senters [phone].pdf XR-Knee(4 view trauma) Left EXAM DESCRIPTION: XR-KNEE(4 VIEW TRAUMA) LEFT; 03/23/2024 11:00:00 AM  REASON FOR STUDY: Left knee pain.  COMPARISON: None.  NUMBER OF VIEWS: 4 views.  TECHNIQUE: AP, lateral, and both obliques without weight bearing radiographic images acquired of the left knee.  LIMITATIONS: None.  FINDINGS: MINERALIZATION: Normal. BONES: No acute fracture or dislocation. No worrisome bone lesions. No significant osteophytes. No erosions or cortical destruction.    JOINT: Small joint effusion. Moderate to advanced degenerative changes of the left knee characterized by joint space narrowing, subchondral sclerosis, subchondral cyst formation, marginal osteophytosis. OTHER: Scattered atherosclerotic vascular calcifications.  IMPRESSION: Moderate degenerative joint disease of the left knee without acute findings.  Copyright 2011 [employer]- All Rights Reserved  Electronically signed by:  Alm Ends DO  03/23/2024 11:44 AM EST RP Workstation: [id number] XR Chest Portable AP 1 View Only EXAM DESCRIPTION: XR CHEST PORTABLE 1 VIEW; 03/23/2024 11:00:00 AM  REASON FOR STUDY: chest pain  COMPARISON: Chest radiograph 12/26/2023.  NUMBER OF VIEWS: One view.  TECHNIQUE: Single frontal radiographic view of the chest acquired.  LIMITATIONS: Patient rotated toward the right.  FINDINGS: LUNGS AND PLEURA: No opacities, masses or pneumothorax. No  pleural effusion. Linear opacities at the left lung base may represent scarring or atelectasis. MEDIASTINUM AND HILAR STRUCTURES: No masses.  Contour normal. HEART AND VASCULAR STRUCTURES: Heart normal in size.  Normal vasculature. BONES: No acute findings. HARDWARE: None in the chest. OTHER: Postsurgical clip in the epigastric region.  IMPRESSION: NO SIGNIFICANT RADIOGRAPHIC FINDING IN THE CHEST.  COMMENT:  TECHNICAL DOCUMENTATION:  Copyright 2011 [employer]- All Rights Reserved  Electronically signed by:  Alm Ends DO  03/23/2024 11:43 AM EST RP Workstation: [id number] XR Pelvis 1 or 2 Views EXAM DESCRIPTION: XR PELVIS 1 OR 2 VIEWS; 03/23/2024 11:00:00 AM  REASON FOR STUDY: left hip pain.  COMPARISON: None.  NUMBER OF VIEWS: One view  TECHNIQUE: AP Pelvis  LIMITATIONS: The lateral aspect of the left femur is out of the field-of-view.  FINDINGS: MINERALIZATION: Normal. HIPS: Femoral heads are well-seated in the acetabula bilaterally. There are mild degenerative changes of both hip joints characterized by joint space narrowing and marginal osteophytes. No evidence of an acute fracture. PELVIS AND SACRUM: No acute fracture or dislocation. No worrisome bone lesions.  PUBIS AND ISCHIUM: No acute fracture. LOWER LUMBAR SPINE: Degenerative changes without acute findings SOFT TISSUES: No findings. OTHER: Multiple curvilinear radiodensities project over the lower abdomen and pelvis, which could represent postsurgical changes versus material external to the patient.  IMPRESSION: Degenerative changes without acute findings. Evaluation of the lateral aspect of the left femur is limited on this exam.  Copyright 2011 [employer]- All Rights Reserved  Electronically signed by:  Alm Ends DO  03/23/2024 11:41 AM EST RP Workstation: [id number]      Assessment and Plan:   Assessment & Plan Acute on chronic anemia The patient  does report melena stools for the past few months This patient's presentation is not new with having acute on chronic anemia, she is followed by hematology and gastroenterology, has had an upper endoscopy in August that was unremarkable, they believed it could be coming from the small intestines area, but also has chronic anemia associated with chronic kidney disease and iron  deficiency and chronic anticoagulation use. The patient goes through blood transfusions and iron  infusions every few months Hemoglobin 5.0, platelet count 254  going to receive 2 units packed red blood cells Reevaluate CBC tomorrow morning Holding Eliquis  I will consult with gastroenterology for clearance for any inpatient interventions that may need to be done The patient should stop Eliquis  and see if this is the main issue for the quick turnaround acute anemia GI bleed Melena stools for the past few months, this is chronic Possibly a small bowel microbleed I believe the patient should stop the Eliquis  due to the recurrence of acute symptomatic anemia Leukocytosis Possibly reactive, chest x-ray negative, obtaining a UA urine culture Recent treatment for urinary tract infection finished antibiotics a week ago Chronic diastolic CHF (congestive heart failure) (CMS/HCC) (HCC) Stable, euvolemic Going to receive 2 units packed red blood cells Blood pressure is soft, holding carvedilol , torsemide  CKD (chronic kidney disease), stage IV (CMS/HCC) (HCC) Stable, patient at baseline CAD (coronary artery disease) Stable, continue atorvastatin , holding carvedilol  Class 3 obesity (HCC) Risk impairments during hospitalization prolonged stay, extra nursing staff, mobility, poor coverage, poor infection clearance Dementia (HCC) Continue memantine  Diabetic peripheral neuropathy associated with type 2 diabetes mellitus (CMS/HCC) (HCC) Starting patient back Lantus  34 units in the morning Starting sliding scale insulin  low-dose  ACHS Monitor hypoglycemic toxicity with fingersticks ACHS Continue hypoglycemic protocol continue as needed Essential hypertension Blood pressure is soft, holding carvedilol  and torsemide  History of DVT (deep vein thrombosis) Also history of PE, unknown how long ago Holding Eliquis  Hyperlipidemia Continue atorvastatin  Vertigo Continue meclizine  Severe episode of recurrent major depressive disorder, without psychotic features (HCC) Continue amitriptyline  Spinal stenosis, lumbar region, with neurogenic claudication Continue gabapentin  Starting patient on oxycodone  5 mg Q6 as needed for  severe pain Starting patient on tramadol  50 mg every 6 hours as needed for mild pain Bedbound Skin integrity  protocol Breast cancer (HCC) Follows with oncology     Total time spent not including any separately reported times:  60 minutes  Disposition:  Current Living situation: Home Expected Disposition: Home Estimated D/C: 1 to 2 days  Diet Adult diet Regular; 2,000 mg Na; Consistent Carbohydrate 4  DVT Prophylaxis []  Lovenox , []   Heparin , [x]  SCDs, []  Ambulation,   []  Eliquis , []  Xarelto , []  Coumadin   Code Status DNR  Surrogate Decision Maker/ POA Daughter   Statement of Medical Necessity: The patient requires hospitalization for treatment of acute on chronic anemia hemoglobin 5.0.  Requiring 2 units packed red blood cells, reevaluating labs inpatient status tomorrow, also need urinalysis to rule out infectious process leukocytosis.    Personally reviewed Lab Studies and Imaging   Discussed with other providers: Did not discuss with any other physicians/consultants.  EKG Interpretation: No ECG independently reviewed  Radiology Statement: No imaging independently reviewed.  Drug toxicity monitoring: No drugs monitored for toxicity   Glendia JULIANNA Ross, NP  Consults         [1] Past Medical History: Diagnosis Date  . Arthritis   . Asthma   . CHF (congestive heart failure)  (CMS/HCC) (HCC)   . COPD (chronic obstructive pulmonary disease)   . Coronary artery disease   . Diabetes mellitus   . High cholesterol   . History of transfusion   . Hypertension   . Renal disorder

## 2024-03-24 ENCOUNTER — Inpatient Hospital Stay

## 2024-03-25 DIAGNOSIS — K2950 Unspecified chronic gastritis without bleeding: Secondary | ICD-10-CM | POA: Diagnosis not present

## 2024-03-25 DIAGNOSIS — E1122 Type 2 diabetes mellitus with diabetic chronic kidney disease: Secondary | ICD-10-CM | POA: Diagnosis not present

## 2024-03-25 DIAGNOSIS — I129 Hypertensive chronic kidney disease with stage 1 through stage 4 chronic kidney disease, or unspecified chronic kidney disease: Secondary | ICD-10-CM | POA: Diagnosis not present

## 2024-03-25 DIAGNOSIS — N184 Chronic kidney disease, stage 4 (severe): Secondary | ICD-10-CM | POA: Diagnosis not present

## 2024-03-25 DIAGNOSIS — D649 Anemia, unspecified: Secondary | ICD-10-CM | POA: Diagnosis not present

## 2024-03-25 DIAGNOSIS — K449 Diaphragmatic hernia without obstruction or gangrene: Secondary | ICD-10-CM | POA: Diagnosis not present

## 2024-03-25 DIAGNOSIS — D509 Iron deficiency anemia, unspecified: Secondary | ICD-10-CM | POA: Diagnosis not present

## 2024-03-26 ENCOUNTER — Other Ambulatory Visit: Payer: Self-pay | Admitting: Family Medicine

## 2024-03-26 DIAGNOSIS — K219 Gastro-esophageal reflux disease without esophagitis: Secondary | ICD-10-CM

## 2024-03-26 NOTE — Discharge Summary (Signed)
 [facility] Hospitalist  DISCHARGE SUMMARY Epic Chat Secure Text preferred   Admit Date: 03/23/2024   Discharge date: 03/26/2024 Code Status: DNR  Patient's PCP: [name], MD Providers to follow up with: Providers in [facility] on return   Hospital Course: 89 year old black woman with a medical history of insulin -dependent diabetes mellitus type 2, diabetic polyneuropathy, hypertension, CKD 4, chronic anemia of chronic renal disease and iron  deficiency, GERD, chronic diastolic HFpEF 55-60%, coronary artery disease with history of MI, migraines, depression with anxiety, COPD, breast cancer, DVT right leg and pulmonary embolism, gastric bypass in the 70s who was visiting from [place] presented to the ER with generalized pain all over but particularly in her legs.  She was hospitalized here in August of this year for acute on chronic anemia requiring blood transfusions with unremarkable upper endoscopy.  She receives blood transfusions every few months as well as iron  infusions.  She was also hospitalized 10/22 to 02/29/24 in [place] for symptomatic anemia with hemoglobin of 5.9, transfused 2 units PRBCs with hemoglobin 8.2 on discharge.  She was also told to discontinue Eliquis  at that time.  X-ray of the left knee with moderate degenerative joint disease without acute findings. X-ray pelvis with degenerative changes with no acute findings. Chest x-ray no acute findings. Venous Doppler study of the left leg negative for DVT.  Has received 3 units PRBCs so far this admission with hemoglobin appearing to remain stable around 9.3.  Upper endoscopy by Dr. Rayna showed only mild gastritis but no active bleeding.  At time of discharge no longer having black stools, hemoglobin stable at 8.2 with no evidence of further bleeding, serum creatinine also improved to 2.24.  Due to recurrent hospitalizations for GI bleeding requiring transfusions over the last few months Eliquis  will not be restarted.  GI  recommended pantoprazole  40 mg twice daily at discharge.  Patient lives in [place] and all her providers are there and will follow-up with GI on return.  Daughter at bedside this morning with discharge plans discussed.  Discharge Diagnoses: Principal Problem (Resolved):   Acute on chronic blood loss anemia Active Problems:   CAD (coronary artery disease)   Chronic diastolic CHF (congestive heart failure) (CMS/HCC) (HCC)   CKD (chronic kidney disease), stage IV (CMS/HCC) (HCC)   Class 3 severe obesity with serious comorbidity and body mass index (BMI) of 40.0 to 44.9 in adult   Mild vascular dementia without behavioral disturbance, psychotic disturbance, mood disturbance, or anxiety (HCC)   Diabetic peripheral neuropathy associated with type 2 diabetes mellitus (CMS/HCC) (HCC)   Essential hypertension   Dyslipidemia   Vertigo   Spinal stenosis, lumbar region, with neurogenic claudication   Bedbound   Leukocytosis   DNR (do not resuscitate)   Insomnia Resolved Problems:   Melanotic stools   History of breast cancer   History of DVT (deep vein thrombosis)   Severe episode of recurrent major depressive disorder, without psychotic features (HCC)   Gastrointestinal hemorrhage, unspecified gastrointestinal hemorrhage type   Urinary tract infection without hematuria   Discharge Medications: Home Medications After Discharge  Scheduled . amitriptyline  (Elavil ) 75 MG tablet, Take 75 mg by mouth at bedtime. . atorvastatin  (Lipitor ) 80 MG tablet, Take 80 mg by mouth in the evening. . carvedilol  (Coreg ) 3.125 MG tablet, Take 1 tablet by mouth in the morning and 1 tablet in the evening. Take with meals. . cefdinir  (Omnicef ) 300 MG capsule, Take 1 capsule by mouth in the morning for 2 days. Do not start before  March 27, 2024. Start: 03/27/24 . ergocalciferol  (Vitamin D2) 1.25 MG (50000 UT) capsule, Take 1.25 mg by mouth 1 time each week. Takes on Monday . febuxostat  (Uloric ) 40 MG tablet,  Take 40 mg by mouth in the morning. . folic acid  (Folvite ) 1 MG tablet, Take 1,000 mcg by mouth in the morning. . gabapentin  (Neurontin ) 100 MG capsule, Take 100 mg by mouth at bedtime. . insulin  aspart (NovoLOG  FLEXPEN) 100 UNIT/ML pen, Inject 14 Units under the skin in the morning and 14 Units in the evening. Inject before meals. Do not exceed 70 units per 24 hours. . meclizine  (Antivert ) 25 MG tablet, Take 25 mg by mouth 2 times a day. . memantine  (Namenda ) 5 MG tablet, Take 10 mg by mouth in the morning. . memantine  (Namenda ) 5 MG tablet, Take 5 mg by mouth at bedtime. . mupirocin (Bactroban) 2 % ointment, Apply 1 Application topically in the morning and 1 Application in the evening and 1 Application before bedtime. . pantoprazole  (Protonix ) 40 MG EC tablet, Take 1 tablet by mouth in the morning and 1 tablet in the evening. Take before meals. Do not crush, chew, or split. . torsemide  (Demadex ) 20 MG tablet, Take 20 mg by mouth in the morning. . Toujeo  Max SoloStar, 2 unit dial , 300 UNIT/ML injection pen, Inject 36 Units under the skin every morning.  PRN . acetaminophen  (Tylenol ) 325 MG tablet, Take 2 tablets by mouth every 4 hours as needed for mild pain (1-3). . fluticasone  (Flonase ) 50 MCG/ACT nasal spray, Administer 2 sprays into affected nostril(s) daily as needed for allergies. . nitroglycerin  (Nitrostat ) 0.4 MG SL tablet, Place 0.4 mg under the tongue every 5 minutes as needed for chest pain. . ondansetron  ODT (Zofran -ODT) 4 MG disintegrating tablet, Take 1 tablet by mouth every 4 hours as needed for nausea or vomiting. . traMADol  (Ultram ) 50 MG tablet, Take 1 tablet by mouth 3 times a day as needed for severe pain (7-10) or moderate pain (4-6) for up to 10 days. . Ubrelvy  50 MG tablet, Take 50 mg by mouth daily as needed. TAKE ONE TABLET BY MOUTH AT ONSET OF MIGRAINE. IF SYMPTOMS PERSIST, A SECOND DOSE MAY BE TAKEN IN 2 HOURS. DO NOT EXCEED 2 DOSES IN A 24 HOUR PERIOD, UNLESS OTHERWISE  INSTRUCTED BY YOUR PHYSICIAN  Esophagogastroduodenoscopy Table formatting from the original result was not included. Indication Iron  deficiency anemia  Impression The duodenum appeared normal. Performed random biopsy to rule out celiac  disease.  Mild abnormal mucosa in the antrum, suggestive of gastritis; performed  cold forceps biopsy to rule out H. pylori.  Medium type I hiatal hernia.  The esophagus appeared normal.   Recommendation -> Resume all previous medications including anticoagulation regiment -> Await pathology -> Discharge to home on a proton pump inhibitor for 3 months -> No further inpatient GI recommendations, GI will sign off at this time    Medications No administrations occurring from 1315 to 1324 on 03/25/24   Preprocedure A history and physical has been performed, and patient medication  allergies have been reviewed. [name] tolerance of previous  anesthesia has been reviewed. The risks and benefits of the procedure and  the sedation options and risks were discussed with the patient. All  questions were answered and informed consent obtained.  Details of the Procedure The patient underwent monitored anesthesia care, which was administered by  an anesthesia professional. [name] blood pressure, heart rate,  level of consciousness, oxygen  saturation and respirations were monitored  throughout the procedure. The scope was introduced into the mouth through  a bite block and advanced to the second part of the duodenum. Insufflated  with carbon dioxide. Retroflexion was performed in the cardia. Prior to  the procedure, the patient's H. Pylori status was unknown. The patient  experienced no blood loss. The procedure was not difficult. The patient  tolerated the procedure well. There were no apparent adverse events.   Findings The duodenum appeared normal. Performed random biopsy using biopsy forceps  to rule out celiac disease.  Mild, localized  abnormal mucosa in the antrum, suggestive of gastritis;  performed cold forceps biopsy to rule out H. pylori.  Medium sliding hiatal hernia (type I hiatal hernia) - top of gastric  folds.  The esophagus appeared normal.   Specimens ID Type Source Tests Collected by Time  1 : r.o celiac Tissue Small Intestine, Duodenum TISSUE EXAM Alyce BRAVO McGarr,  DO 03/25/2024 1322  2 : r.o h.pylori Tissue Gastric Antrum TISSUE EXAM Alyce BRAVO McGarr, DO  03/25/2024 1323   Staff Staff Role  Merilee Ada, RN Sedation Nurse  Melvenia Mace, RN Sedation Nurse  Lamar JONETTA Jefferson, CRNA CRNA  Alyce BRAVO Ober, DO Proceduralist     Discharge Physical Exam:   Vitals:   03/25/24 2352 03/26/24 0000 03/26/24 0353 03/26/24 0805  BP: (!) 131/46  112/51 129/53  BP Location: Left arm  Left arm Left arm  Patient Position: Lying  Lying Lying  Pulse:   79 71  Resp:   20 20  Temp:   36.3 C (97.3 F) 36.3 C (97.3 F)  TempSrc:   Temporal Oral  SpO2:   97% 95%  Weight:  267 lb 14.4 oz (121.5 kg)    Height:       I/O last 3 completed shifts: In: 3567.3 (29.4 mL/kg) [P.O.:720; I.V.:2632.3 (21.7 mL/kg); Blood:160; IV Piggyback:55] Out: 550 (4.5 mL/kg) [Urine:550 (0.1 mL/kg/hr)] Weight: 121.5 kg  I/O this shift: In: 300 (2.5 mL/kg) [P.O.:300] Out: 550 (4.5 mL/kg) [Urine:550] Weight: 121.5 kg  Visit Vitals BP 129/53 (BP Location: Left arm, Patient Position: Lying)  Pulse 71  Temp 36.3 C (97.3 F) (Oral)  Resp 20    Physical Exam BMI: 42 General: awake, oriented x 3, appears comfortable, normal speech and pleasant affect, on room air Respiratory: Clear breath sounds bilaterally Cardiovascular: Regular rate and rhythm, no murmur GI: Soft, obese, nontender, bowel sounds normoactive Musculoskeletal: Tenderness both feet dorsally, plantar surfaces, both lower legs with no obvious swelling or erythema present.  Pulses intact with normal skin temp  No  Spent 36 minutes minutes discharging this patient from  the hospital.  Lamar Aliene Myrna Mickey., MD

## 2024-03-28 ENCOUNTER — Inpatient Hospital Stay: Admitting: Nurse Practitioner

## 2024-03-28 MED FILL — Iron Sucrose Inj 20 MG/ML (Fe Equiv): Qty: 265 | Status: AC

## 2024-03-29 ENCOUNTER — Inpatient Hospital Stay

## 2024-03-31 ENCOUNTER — Ambulatory Visit (INDEPENDENT_AMBULATORY_CARE_PROVIDER_SITE_OTHER): Admitting: Nurse Practitioner

## 2024-03-31 ENCOUNTER — Encounter: Payer: Self-pay | Admitting: Nurse Practitioner

## 2024-03-31 ENCOUNTER — Ambulatory Visit: Payer: Self-pay | Admitting: Nurse Practitioner

## 2024-03-31 VITALS — BP 130/88 | HR 67 | Temp 97.6°F | Ht 67.0 in

## 2024-03-31 DIAGNOSIS — F0390 Unspecified dementia without behavioral disturbance: Secondary | ICD-10-CM

## 2024-03-31 DIAGNOSIS — M170 Bilateral primary osteoarthritis of knee: Secondary | ICD-10-CM

## 2024-03-31 DIAGNOSIS — E1140 Type 2 diabetes mellitus with diabetic neuropathy, unspecified: Secondary | ICD-10-CM | POA: Diagnosis not present

## 2024-03-31 DIAGNOSIS — K922 Gastrointestinal hemorrhage, unspecified: Secondary | ICD-10-CM | POA: Diagnosis not present

## 2024-03-31 DIAGNOSIS — R531 Weakness: Secondary | ICD-10-CM

## 2024-03-31 DIAGNOSIS — R002 Palpitations: Secondary | ICD-10-CM | POA: Diagnosis not present

## 2024-03-31 DIAGNOSIS — Z794 Long term (current) use of insulin: Secondary | ICD-10-CM | POA: Diagnosis not present

## 2024-03-31 DIAGNOSIS — G4709 Other insomnia: Secondary | ICD-10-CM

## 2024-03-31 DIAGNOSIS — F411 Generalized anxiety disorder: Secondary | ICD-10-CM | POA: Diagnosis not present

## 2024-03-31 LAB — CBC WITH DIFFERENTIAL/PLATELET
Basophils Absolute: 0 K/uL (ref 0.0–0.1)
Basophils Relative: 0.3 % (ref 0.0–3.0)
Eosinophils Absolute: 0.2 K/uL (ref 0.0–0.7)
Eosinophils Relative: 2.7 % (ref 0.0–5.0)
HCT: 28.7 % — ABNORMAL LOW (ref 36.0–46.0)
Hemoglobin: 9.4 g/dL — ABNORMAL LOW (ref 12.0–15.0)
Lymphocytes Relative: 20.4 % (ref 12.0–46.0)
Lymphs Abs: 1.9 K/uL (ref 0.7–4.0)
MCHC: 32.8 g/dL (ref 30.0–36.0)
MCV: 90 fl (ref 78.0–100.0)
Monocytes Absolute: 0.6 K/uL (ref 0.1–1.0)
Monocytes Relative: 6.5 % (ref 3.0–12.0)
Neutro Abs: 6.5 K/uL (ref 1.4–7.7)
Neutrophils Relative %: 70.1 % (ref 43.0–77.0)
Platelets: 235 K/uL (ref 150.0–400.0)
RBC: 3.19 Mil/uL — ABNORMAL LOW (ref 3.87–5.11)
RDW: 15.5 % (ref 11.5–15.5)
WBC: 9.3 K/uL (ref 4.0–10.5)

## 2024-03-31 LAB — BASIC METABOLIC PANEL WITH GFR
BUN: 62 mg/dL — ABNORMAL HIGH (ref 6–23)
CO2: 27 meq/L (ref 19–32)
Calcium: 8.5 mg/dL (ref 8.4–10.5)
Chloride: 104 meq/L (ref 96–112)
Creatinine, Ser: 2.46 mg/dL — ABNORMAL HIGH (ref 0.40–1.20)
GFR: 17.2 mL/min — ABNORMAL LOW (ref >60.00)
Glucose, Bld: 84 mg/dL (ref 70–99)
Potassium: 5.1 meq/L (ref 3.5–5.1)
Sodium: 137 meq/L (ref 135–145)

## 2024-03-31 LAB — MAGNESIUM: Magnesium: 1.5 mg/dL (ref 1.5–2.5)

## 2024-03-31 MED ORDER — MAGNESIUM OXIDE 400 MG PO TABS: 400.0000 mg | ORAL_TABLET | Freq: Every day | ORAL | 0 refills | Status: AC

## 2024-03-31 MED ORDER — AMITRIPTYLINE HCL 75 MG PO TABS: 75.0000 mg | ORAL_TABLET | Freq: Every day | ORAL | 3 refills | Status: DC

## 2024-03-31 MED ORDER — MEMANTINE HCL 5 MG PO TABS: 5.0000 mg | ORAL_TABLET | Freq: Two times a day (BID) | ORAL | 0 refills | Status: DC

## 2024-03-31 MED ORDER — PANTOPRAZOLE SODIUM 40 MG PO TBEC: 40.0000 mg | DELAYED_RELEASE_TABLET | Freq: Two times a day (BID) | ORAL | 1 refills | Status: AC

## 2024-03-31 MED ORDER — GABAPENTIN 100 MG PO CAPS: 100.0000 mg | ORAL_CAPSULE | Freq: Every day | ORAL | 3 refills | Status: AC

## 2024-03-31 MED ORDER — CARVEDILOL 3.125 MG PO TABS: 3.1250 mg | ORAL_TABLET | Freq: Two times a day (BID) | ORAL | 0 refills | Status: AC

## 2024-03-31 NOTE — Assessment & Plan Note (Addendum)
 Managed with memantine . Concerns about excessive dosing led to adjusting memantine  to one tablet (5mg ) twice a day. Prescription updated. Keep follow-up appointment with PCP in January.

## 2024-03-31 NOTE — Assessment & Plan Note (Signed)
 Moderate arthritis confirmed by x-rays. Pain is managed with Tylenol  and BCs, stop BC goody powder due to potential GI side effects. Continue Tylenol  as needed for pain.

## 2024-03-31 NOTE — Progress Notes (Signed)
 Established Patient Office Visit  Subjective   Patient ID: [name], female    DOB: 04-30-1935  Age: 89 y.o. MRN: [id number]  No chief complaint on file.   HPI Discussed the use of AI scribe software for clinical note transcription with the patient, who gave verbal consent to proceed.  History of Present Illness   [name] is an 89 year old female who presents with recent hospitalization for dark stools and leg pain. She is accompanied by her daughter and grandson.  She was hospitalized from 11/16-25-03/26/24 for foot pain, melena and received three pints of blood. Her stools remain slightly dark, but there has been no further bleeding since discharge. She had an EGD which showed mild gastritis, but no active bleeding. She had an xray of her left knee and pelvis which showed moderate arthritis. Ultrasound was negative for DVT. Due to bleeding, her eliquis  was stopped. She was started on tramadol  as needed for pain which has helped.   She denies abdominal pain, although does have some intermittent constipation. Her pain is doing better in her feet and legs. She is taking gabapentin  100mg  at bedtime as well. She needs refills on a few of her medications so they can be put into pill packs from the pharmacy. She has appointments scheduled with hematology, but needs to call GI for an appointment. She is feeling weak, and needs help with ADLs.       ROS See pertinent positives and negatives per HPI.    Objective:     BP 130/88 (BP Location: Left Wrist, Patient Position: Sitting, Cuff Size: Small)   Pulse 67   Temp 97.6 F (36.4 C)   Ht 5' 7 (1.702 m)   SpO2 99%   BMI 39.47 kg/m  BP Readings from Last 3 Encounters:  03/31/24 130/88  03/08/24 (!) 118/59  03/05/24 111/72   Wt Readings from Last 3 Encounters:  03/05/24 252 lb (114.3 kg)  02/27/24 275 lb (124.7 kg)  01/16/24 268 lb 1.3 oz (121.6 kg)      Physical Exam Vitals and nursing note reviewed.   Constitutional:      General: She is not in acute distress.    Appearance: Normal appearance.  HENT:     Head: Normocephalic.  Eyes:     Conjunctiva/sclera: Conjunctivae normal.  Cardiovascular:     Rate and Rhythm: Normal rate and regular rhythm.     Pulses: Normal pulses.     Heart sounds: Normal heart sounds.  Pulmonary:     Effort: Pulmonary effort is normal.     Breath sounds: Normal breath sounds.  Abdominal:     Palpations: Abdomen is soft.     Tenderness: There is no abdominal tenderness.  Musculoskeletal:     Cervical back: Normal range of motion.     Comments: Generalized weakness, in wheelchair  Skin:    General: Skin is warm.  Neurological:     General: No focal deficit present.     Mental Status: She is alert and oriented to person, place, and time.  Psychiatric:        Mood and Affect: Mood normal.        Behavior: Behavior normal.        Thought Content: Thought content normal.        Judgment: Judgment normal.      Assessment & Plan:   Problem List Items Addressed This Visit       Digestive   Upper GI bleed -  Primary   Recently hospitalized from 03/23/24-03/26/24 for upper GI bleed and melena. Recent gastrointestinal bleeding with melena required a transfusion. Endoscopy was normal. No further melena, though stools remain slightly dark. Follow up with GI specialist and keep appointments with hematology. Eliquis  was discontinued due to bleeding. Check BMP, CBC today. Medication reconciliation completed.       Relevant Orders   Basic metabolic panel with GFR   CBC with Differential/Platelet     Nervous and Auditory   Dementia (HCC)   Managed with memantine . Concerns about excessive dosing led to adjusting memantine  to one tablet (5mg ) twice a day. Prescription updated. Keep follow-up appointment with PCP in January.       Relevant Medications   memantine  (NAMENDA ) 5 MG tablet   gabapentin  (NEURONTIN ) 100 MG capsule   amitriptyline  (ELAVIL ) 75 MG  tablet     Musculoskeletal and Integument   Bilateral primary osteoarthritis of knee   Moderate arthritis confirmed by x-rays. Pain is managed with Tylenol  and BCs, stop BC goody powder due to potential GI side effects. Continue Tylenol  as needed for pain.      Relevant Medications   acetaminophen  (TYLENOL ) 325 MG tablet     Other   Insomnia (Chronic)   Stable, continue elevail 75mg  at bedtime.       Relevant Medications   amitriptyline  (ELAVIL ) 75 MG tablet   Weakness   Generalized weakness, needing help with ADLs and wheelchair bound. Will order home health PT and aide to assist with some strengthening and ADLs.       Relevant Orders   Ambulatory referral to Home Health   GAD (generalized anxiety disorder)   Stable, continue elavil  75mg  at bedtime.       Relevant Medications   amitriptyline  (ELAVIL ) 75 MG tablet   Other Visit Diagnoses       Type 2 diabetes mellitus with diabetic neuropathy, with long-term current use of insulin  (HCC)       Continue gabapentin  100mg  at bedtime   Relevant Medications   gabapentin  (NEURONTIN ) 100 MG capsule   amitriptyline  (ELAVIL ) 75 MG tablet     Hypomagnesemia       Check magnesium  levels today and treat based on results.   Relevant Orders   Magnesium       Return if symptoms worsen or fail to improve.    Tinnie DELENA Harada, NP

## 2024-03-31 NOTE — Assessment & Plan Note (Signed)
 Stable, continue elavil  75mg  at bedtime.

## 2024-03-31 NOTE — Assessment & Plan Note (Signed)
 Generalized weakness, needing help with ADLs and wheelchair bound. Will order home health PT and aide to assist with some strengthening and ADLs.

## 2024-03-31 NOTE — Patient Instructions (Addendum)
 It was great to see you!  Call and schedule an appointment with GI   We are checking your labs today and will let you know the results via mychart/phone.   [facility]   I have placed a referral for home health   Let's follow-up with any concerns  Take care,  Tinnie Harada, NP

## 2024-03-31 NOTE — Assessment & Plan Note (Addendum)
 Recently hospitalized from 03/23/24-03/26/24 for upper GI bleed and melena. Recent gastrointestinal bleeding with melena required a transfusion. Endoscopy was normal. No further melena, though stools remain slightly dark. Follow up with GI specialist and keep appointments with hematology. Eliquis  was discontinued due to bleeding. Check BMP, CBC today. Medication reconciliation completed.

## 2024-03-31 NOTE — Assessment & Plan Note (Signed)
 Stable, continue elevail 75mg  at bedtime.

## 2024-04-01 DIAGNOSIS — R002 Palpitations: Secondary | ICD-10-CM | POA: Diagnosis not present

## 2024-04-02 ENCOUNTER — Telehealth: Payer: Self-pay

## 2024-04-02 DIAGNOSIS — D649 Anemia, unspecified: Secondary | ICD-10-CM

## 2024-04-02 NOTE — Telephone Encounter (Signed)
 Copied from CRM [id number]. Topic: Referral - Question >> Apr 02, 2024  2:01 PM China J wrote: Reason for CRM: Case manager is calling to see if Tinnie could add a referral for as she instructed hematology since the patient has never been seen at hematology before.

## 2024-04-04 ENCOUNTER — Other Ambulatory Visit: Payer: Self-pay | Admitting: Family Medicine

## 2024-04-07 ENCOUNTER — Emergency Department (HOSPITAL_COMMUNITY)

## 2024-04-07 ENCOUNTER — Other Ambulatory Visit: Payer: Self-pay

## 2024-04-07 ENCOUNTER — Emergency Department (HOSPITAL_COMMUNITY)
Admission: EM | Admit: 2024-04-07 | Discharge: 2024-04-07 | Disposition: A | Discharge status: Home / Self Care | Attending: Emergency Medicine | Admitting: Emergency Medicine

## 2024-04-07 ENCOUNTER — Other Ambulatory Visit: Payer: Self-pay | Admitting: Family Medicine

## 2024-04-07 ENCOUNTER — Encounter (HOSPITAL_COMMUNITY): Payer: Self-pay

## 2024-04-07 DIAGNOSIS — R0989 Other specified symptoms and signs involving the circulatory and respiratory systems: Secondary | ICD-10-CM | POA: Diagnosis not present

## 2024-04-07 DIAGNOSIS — D72829 Elevated white blood cell count, unspecified: Secondary | ICD-10-CM | POA: Diagnosis not present

## 2024-04-07 DIAGNOSIS — Z794 Long term (current) use of insulin: Secondary | ICD-10-CM | POA: Diagnosis not present

## 2024-04-07 DIAGNOSIS — R457 State of emotional shock and stress, unspecified: Secondary | ICD-10-CM | POA: Diagnosis not present

## 2024-04-07 DIAGNOSIS — I5032 Chronic diastolic (congestive) heart failure: Secondary | ICD-10-CM

## 2024-04-07 DIAGNOSIS — R55 Syncope and collapse: Secondary | ICD-10-CM | POA: Insufficient documentation

## 2024-04-07 DIAGNOSIS — R42 Dizziness and giddiness: Secondary | ICD-10-CM | POA: Diagnosis not present

## 2024-04-07 LAB — I-STAT CHEM 8, ED
BUN: 62 mg/dL — ABNORMAL HIGH (ref 8–23)
Calcium, Ion: 1.17 mmol/L (ref 1.15–1.40)
Chloride: 104 mmol/L (ref 98–111)
Creatinine, Ser: 2.8 mg/dL — ABNORMAL HIGH (ref 0.44–1.00)
Glucose, Bld: 184 mg/dL — ABNORMAL HIGH (ref 70–99)
HCT: 30 % — ABNORMAL LOW (ref 36.0–46.0)
Hemoglobin: 10.2 g/dL — ABNORMAL LOW (ref 12.0–15.0)
Potassium: 4.7 mmol/L (ref 3.5–5.1)
Sodium: 142 mmol/L (ref 135–145)
TCO2: 25 mmol/L (ref 22–32)

## 2024-04-07 LAB — COMPREHENSIVE METABOLIC PANEL WITH GFR
ALT: 13 U/L (ref 0–44)
AST: 17 U/L (ref 15–41)
Albumin: 2.7 g/dL — ABNORMAL LOW (ref 3.5–5.0)
Alkaline Phosphatase: 79 U/L (ref 38–126)
Anion gap: 9 (ref 5–15)
BUN: 60 mg/dL — ABNORMAL HIGH (ref 8–23)
CO2: 24 mmol/L (ref 22–32)
Calcium: 8.5 mg/dL — ABNORMAL LOW (ref 8.9–10.3)
Chloride: 106 mmol/L (ref 98–111)
Creatinine, Ser: 2.52 mg/dL — ABNORMAL HIGH (ref 0.44–1.00)
GFR, Estimated: 18 mL/min — ABNORMAL LOW (ref >60)
Glucose, Bld: 193 mg/dL — ABNORMAL HIGH (ref 70–99)
Potassium: 4.7 mmol/L (ref 3.5–5.1)
Sodium: 139 mmol/L (ref 135–145)
Total Bilirubin: 0.4 mg/dL (ref 0.0–1.2)
Total Protein: 6.9 g/dL (ref 6.5–8.1)

## 2024-04-07 LAB — CBC WITH DIFFERENTIAL/PLATELET
Abs Immature Granulocytes: 0.04 K/uL (ref 0.00–0.07)
Basophils Absolute: 0 K/uL (ref 0.0–0.1)
Basophils Relative: 0 %
Eosinophils Absolute: 0.3 K/uL (ref 0.0–0.5)
Eosinophils Relative: 2 %
HCT: 31.7 % — ABNORMAL LOW (ref 36.0–46.0)
Hemoglobin: 9.2 g/dL — ABNORMAL LOW (ref 12.0–15.0)
Immature Granulocytes: 0 %
Lymphocytes Relative: 15 %
Lymphs Abs: 1.9 K/uL (ref 0.7–4.0)
MCH: 28.4 pg (ref 26.0–34.0)
MCHC: 29 g/dL — ABNORMAL LOW (ref 30.0–36.0)
MCV: 97.8 fL (ref 80.0–100.0)
Monocytes Absolute: 0.7 K/uL (ref 0.1–1.0)
Monocytes Relative: 6 %
Neutro Abs: 9.6 K/uL — ABNORMAL HIGH (ref 1.7–7.7)
Neutrophils Relative %: 77 %
Platelets: 286 K/uL (ref 150–400)
RBC: 3.24 MIL/uL — ABNORMAL LOW (ref 3.87–5.11)
RDW: 15.5 % (ref 11.5–15.5)
WBC: 12.6 K/uL — ABNORMAL HIGH (ref 4.0–10.5)
nRBC: 0 % (ref 0.0–0.2)

## 2024-04-07 LAB — I-STAT CG4 LACTIC ACID, ED
Lactic Acid, Venous: 1.2 mmol/L (ref 0.5–1.9)
Lactic Acid, Venous: 0.8 mmol/L (ref 0.5–1.9)

## 2024-04-07 LAB — TROPONIN I (HIGH SENSITIVITY)
Troponin I (High Sensitivity): 14 ng/L (ref <18)
Troponin I (High Sensitivity): 14 ng/L (ref <18)

## 2024-04-07 LAB — PROTIME-INR
INR: 1 (ref 0.8–1.2)
Prothrombin Time: 14 s (ref 11.4–15.2)

## 2024-04-07 NOTE — ED Provider Notes (Signed)
 [place] EMERGENCY DEPARTMENT AT [facility] Provider Note   CSN: [id number] Arrival date & time: 04/07/24  1304     Patient presents with: No chief complaint on file.   [name] is a 89 y.o. female.  {Add pertinent medical, surgical, social history, OB history to HPI:849} 89 year old female with prior medical history as detailed below presents for evaluation.  Patient reportedly was at home with her family.  Her daughter was braiding her hair.  Patient reportedly became unresponsive.  According to family patient was unresponsive for some time between 15 and 30 minutes.  On arrival to the ED the patient is without complaint.  She denies any memory of syncopal event.  She denies headache or focal weakness.  She denies fever.  She denies chest pain or shortness of breath.  The history is provided by the patient, the EMS personnel and medical records.       Prior to Admission medications   Medication Sig Start Date End Date Taking? Authorizing Provider  acetaminophen  (TYLENOL ) 325 MG tablet Take 650 mg by mouth every 4 (four) hours as needed. 03/26/24   [provider]  albuterol  (PROAIR  HFA) 108 (90 Base) MCG/ACT inhaler Inhale 1-2 puffs into the lungs every 6 (six) hours as needed for wheezing or shortness of breath. Patient not taking: Reported on 03/31/2024 12/19/18   Caro Harlene POUR, NP  amitriptyline  (ELAVIL ) 75 MG tablet Take 1 tablet (75 mg total) by mouth at bedtime. 03/31/24 03/26/25  McElwee, Tinnie LABOR, NP  atorvastatin  (LIPITOR ) 80 MG tablet TAKE 1 TABLET BY MOUTH EVERY DAY 11/01/23   Sebastian Beverley NOVAK, MD  calcitRIOL  (ROCALTROL ) 0.25 MCG capsule Take 0.25 mcg by mouth daily. 10/02/23   [provider]  carvedilol  (COREG ) 3.125 MG tablet Take 1 tablet (3.125 mg total) by mouth 2 (two) times daily with a meal. 03/31/24 06/29/24  McElwee, Lauren A, NP  Continuous Glucose Sensor (FREESTYLE LIBRE 3 PLUS SENSOR) MISC 1 Device by Other route every  14 (fourteen) days. Change sensor every 15 days. 12/12/23   Shamleffer, Donell Cardinal, MD  febuxostat  (ULORIC ) 40 MG tablet TAKE 1 TABLET BY MOUTH EVERY DAY 12/24/23   Sebastian Beverley NOVAK, MD  fluticasone  (VERAMYST) 27.5 MCG/SPRAY nasal spray Place 2 sprays into the nose daily.    [provider]  gabapentin  (NEURONTIN ) 100 MG capsule Take 1 capsule (100 mg total) by mouth at bedtime. 03/31/24   McElwee, Lauren A, NP  glucagon  1 MG injection Inject 1 mg into the vein once as needed for up to 1 dose. 04/13/22   Sebastian Beverley NOVAK, MD  insulin  glargine, 1 Unit Dial , (TOUJEO ) 300 UNIT/ML Solostar Pen Inject 15 Units into the skin daily in the afternoon. 02/29/24   Rosario Leatrice FERNS, MD  ipratropium-albuterol  (DUONEB) 0.5-2.5 (3) MG/3ML SOLN Take 3 mLs by nebulization every 6 (six) hours as needed (cough, wheezing, or shortness of breath). 10/09/23 03/05/24  Sebastian Beverley NOVAK, MD  linaclotide  (LINZESS ) 72 MCG capsule Take 1 capsule (72 mcg total) by mouth daily as needed (constpation). 02/29/24   Rosario Leatrice I, MD  magnesium  oxide (MAG-OX) 400 MG tablet Take 1 tablet (400 mg total) by mouth daily. 03/31/24   McElwee, Lauren A, NP  melatonin 5 MG TABS Take 1 tablet (5 mg total) by mouth at bedtime as needed (sleep). 02/29/24   Rosario Leatrice FERNS, MD  memantine  (NAMENDA ) 5 MG tablet Take 1 tablet (5 mg total) by mouth 2 (two) times daily. 03/31/24  McElwee, Lauren A, NP  nitroGLYCERIN  (NITROSTAT ) 0.4 MG SL tablet Dissolve 1 tablet under the tongue every 5 minutes as needed for chest pain. Max of 3 doses, then 911. 09/20/23   Nahser, Aleene PARAS, MD  pantoprazole  (PROTONIX ) 40 MG tablet Take 1 tablet (40 mg total) by mouth 2 (two) times daily. 03/31/24 09/27/24  McElwee, Lauren A, NP  polyethylene glycol powder (GLYCOLAX /MIRALAX ) 17 GM/SCOOP powder Take 17 g by mouth daily as needed for mild constipation. Dissolve 1 capful (17g) in 4-8 ounces of liquid and take by mouth daily. 02/29/24   Rosario Eland I, MD  torsemide  (DEMADEX ) 20 MG tablet TAKE 1 TABLET BY MOUTH EVERY DAY TAKE AN EXTRA DOSE FOR WEIGHT GAIN OF 3LBs IN 1 DAY OR 5LBS IN 1 WEEK OR FOR WORSENING SWELLING 02/07/24   Sebastian Beverley NOVAK, MD  UBRELVY  50 MG TABS Take 50 mg by mouth daily. 03/03/24   [provider]  Vitamin D , Ergocalciferol , (DRISDOL ) 1.25 MG (50000 UNIT) CAPS capsule TAKE 1 CAPSULE BY MOUTH ONCE WEEKLY ON MONDAY 03/06/24   Kale, Gautam Kishore, MD    Allergies: Sulfonamide derivatives, Duloxetine , Aricept  [donepezil  hcl], Lokelma  [sodium zirconium cyclosilicate ], and Tramadol     Review of Systems  All other systems reviewed and are negative.   Updated Vital Signs Pulse 72   Resp 18   Ht 5' 7 (1.702 m)   Wt 114.3 kg   SpO2 100%   BMI 39.47 kg/m   Physical Exam Vitals and nursing note reviewed.  Constitutional:      General: She is not in acute distress.    Appearance: Normal appearance. She is well-developed.  HENT:     Head: Normocephalic and atraumatic.  Eyes:     Conjunctiva/sclera: Conjunctivae normal.     Pupils: Pupils are equal, round, and reactive to light.  Cardiovascular:     Rate and Rhythm: Normal rate and regular rhythm.     Heart sounds: Normal heart sounds.  Pulmonary:     Effort: Pulmonary effort is normal. No respiratory distress.     Breath sounds: Normal breath sounds.  Abdominal:     General: There is no distension.     Palpations: Abdomen is soft.     Tenderness: There is no abdominal tenderness.  Musculoskeletal:        General: No deformity. Normal range of motion.     Cervical back: Normal range of motion and neck supple.  Skin:    General: Skin is warm and dry.  Neurological:     General: No focal deficit present.     Mental Status: She is alert and oriented to person, place, and time. Mental status is at baseline.     Cranial Nerves: No cranial nerve deficit.     Motor: No weakness.     (all labs ordered are listed, but only abnormal results  are displayed) Labs Reviewed  CBC WITH DIFFERENTIAL/PLATELET - Abnormal; Notable for the following components:      Result Value   WBC 12.6 (*)    RBC 3.24 (*)    Hemoglobin 9.2 (*)    HCT 31.7 (*)    MCHC 29.0 (*)    Neutro Abs 9.6 (*)    All other components within normal limits  COMPREHENSIVE METABOLIC PANEL WITH GFR - Abnormal; Notable for the following components:   Glucose, Bld 193 (*)    BUN 60 (*)    Creatinine, Ser 2.52 (*)    Calcium  8.5 (*)  Albumin  2.7 (*)    GFR, Estimated 18 (*)    All other components within normal limits  I-STAT CHEM 8, ED - Abnormal; Notable for the following components:   BUN 62 (*)    Creatinine, Ser 2.80 (*)    Glucose, Bld 184 (*)    Hemoglobin 10.2 (*)    HCT 30.0 (*)    All other components within normal limits  PROTIME-INR  I-STAT CG4 LACTIC ACID, ED  I-STAT CG4 LACTIC ACID, ED  POC OCCULT BLOOD, ED  TROPONIN I (HIGH SENSITIVITY)  TROPONIN I (HIGH SENSITIVITY)    EKG: EKG Interpretation Date/Time:  Monday April 07 2024 13:23:19 EST Ventricular Rate:  77 PR Interval:  200 QRS Duration:  111 QT Interval:  414 QTC Calculation: 469 R Axis:   -11  Text Interpretation: Sinus rhythm Confirmed by Laurice Coy [phone]) on 04/07/2024 3:09:07 PM  Radiology: No results found.  {Document cardiac monitor, telemetry assessment procedure when appropriate:32947} Procedures   Medications Ordered in the ED - No data to display    {Click here for ABCD2, HEART and other calculators REFRESH Note before signing:1}                              Medical Decision Making Amount and/or Complexity of Data Reviewed Labs: ordered. Radiology: ordered.   ***  {Document critical care time when appropriate  Document review of labs and clinical decision tools ie CHADS2VASC2, etc  Document your independent review of radiology images and any outside records  Document your discussion with family members, caretakers and with consultants   Document social determinants of health affecting pt's care  Document your decision making why or why not admission, treatments were needed:32947:::1}   Final diagnoses:  None    ED Discharge Orders     None

## 2024-04-07 NOTE — Discharge Instructions (Signed)
 Return for any problem.  ?

## 2024-04-07 NOTE — ED Triage Notes (Signed)
 Pt BIB [facility] from home. Patient was getting her hair done by daughter when she suddenly went unresponsive. When EMS got there her radial was thready.. Diastolic is usually low according to family and patient. Unresponsiveness lasted 15-30 Minutes per family. She finally came to and started complaining of dizziness. No pain.   Vitals 108/60 BP Lowest diastolic was 30 with EMS

## 2024-04-08 NOTE — Telephone Encounter (Signed)
 Referral placed.

## 2024-04-08 NOTE — Telephone Encounter (Signed)
Hematology referral information given

## 2024-04-09 ENCOUNTER — Other Ambulatory Visit: Payer: Self-pay | Admitting: Internal Medicine

## 2024-04-10 ENCOUNTER — Encounter (INDEPENDENT_AMBULATORY_CARE_PROVIDER_SITE_OTHER): Payer: Self-pay | Admitting: Otolaryngology

## 2024-04-10 ENCOUNTER — Ambulatory Visit (INDEPENDENT_AMBULATORY_CARE_PROVIDER_SITE_OTHER): Admitting: Otolaryngology

## 2024-04-10 VITALS — BP 119/75 | HR 95 | Temp 97.9°F | Ht 67.0 in | Wt 252.0 lb

## 2024-04-10 DIAGNOSIS — R0982 Postnasal drip: Secondary | ICD-10-CM | POA: Diagnosis not present

## 2024-04-10 DIAGNOSIS — H6983 Other specified disorders of Eustachian tube, bilateral: Secondary | ICD-10-CM

## 2024-04-10 DIAGNOSIS — J343 Hypertrophy of nasal turbinates: Secondary | ICD-10-CM | POA: Diagnosis not present

## 2024-04-10 DIAGNOSIS — H6123 Impacted cerumen, bilateral: Secondary | ICD-10-CM | POA: Diagnosis not present

## 2024-04-10 DIAGNOSIS — H9193 Unspecified hearing loss, bilateral: Secondary | ICD-10-CM | POA: Diagnosis not present

## 2024-04-10 DIAGNOSIS — R0981 Nasal congestion: Secondary | ICD-10-CM

## 2024-04-10 DIAGNOSIS — J310 Chronic rhinitis: Secondary | ICD-10-CM

## 2024-04-10 MED ORDER — IPRATROPIUM BROMIDE 0.06 % NA SOLN: 2.0000 | Freq: Two times a day (BID) | NASAL | 12 refills | Status: AC | PRN

## 2024-04-12 ENCOUNTER — Inpatient Hospital Stay: Attending: Internal Medicine

## 2024-04-12 ENCOUNTER — Other Ambulatory Visit: Payer: Self-pay | Admitting: Internal Medicine

## 2024-04-12 VITALS — BP 104/41 | HR 76 | Temp 98.2°F | Resp 16

## 2024-04-12 DIAGNOSIS — D631 Anemia in chronic kidney disease: Secondary | ICD-10-CM | POA: Insufficient documentation

## 2024-04-12 DIAGNOSIS — N184 Chronic kidney disease, stage 4 (severe): Secondary | ICD-10-CM | POA: Insufficient documentation

## 2024-04-12 DIAGNOSIS — J310 Chronic rhinitis: Secondary | ICD-10-CM | POA: Insufficient documentation

## 2024-04-12 DIAGNOSIS — H6983 Other specified disorders of Eustachian tube, bilateral: Secondary | ICD-10-CM | POA: Insufficient documentation

## 2024-04-12 DIAGNOSIS — D509 Iron deficiency anemia, unspecified: Secondary | ICD-10-CM

## 2024-04-12 DIAGNOSIS — J343 Hypertrophy of nasal turbinates: Secondary | ICD-10-CM | POA: Insufficient documentation

## 2024-04-12 DIAGNOSIS — Z79899 Other long term (current) drug therapy: Secondary | ICD-10-CM | POA: Diagnosis not present

## 2024-04-12 DIAGNOSIS — H6123 Impacted cerumen, bilateral: Secondary | ICD-10-CM | POA: Insufficient documentation

## 2024-04-12 MED ORDER — LORATADINE 10 MG PO TABS
10.0000 mg | ORAL_TABLET | Freq: Once | ORAL | Status: AC
  Administered 2024-04-12: 10 mg via ORAL
  Filled 2024-04-12: qty 1

## 2024-04-12 MED ORDER — ACETAMINOPHEN 325 MG PO TABS
650.0000 mg | ORAL_TABLET | Freq: Once | ORAL | Status: AC
  Administered 2024-04-12: 650 mg via ORAL
  Filled 2024-04-12: qty 2

## 2024-04-12 MED ORDER — IRON SUCROSE 300 MG IVPB - SIMPLE MED
300.0000 mg | Freq: Once | Status: AC
  Administered 2024-04-12: 300 mg via INTRAVENOUS
  Filled 2024-04-12: qty 300

## 2024-04-12 MED ORDER — EPOETIN ALFA-EPBX 40000 UNIT/ML IJ SOLN
40000.0000 [IU] | Freq: Once | INTRAMUSCULAR | Status: DC
  Filled 2024-04-12: qty 1

## 2024-04-12 NOTE — Progress Notes (Signed)
 Attempted IV stick x 1 in Left PFA unsuccessful.  Catheter tip removed intact.  Site unremarkable.

## 2024-04-12 NOTE — Patient Instructions (Signed)

## 2024-04-12 NOTE — Progress Notes (Signed)
 CC: Hearing loss, nasal and ear congestion, nasal drainage  Discussed the use of AI scribe software for clinical note transcription with the patient, who gave verbal consent to proceed.  History of Present Illness [name] is an 89 year old female who presents with hearing loss and nasal and ear congestion. She is accompanied by her daughter.  She experiences bilateral hearing loss, which has been gradually worsening over several years. She has been using hearing aids for almost a year and follows up with an audiologist regularly.  She describes a sensation of ear congestion, feeling as though her ears are 'stopped up.' She has a history of ear infections treated with antibiotics, but no recent infections. No history of ear surgeries.  She experiences mucus drainage from her nose to her throat. She does not blow her nose often and sometimes has trouble breathing through her nose. No throat pain or discomfort is reported.   Past Medical History:  Diagnosis Date   Abdominal pain 01/26/2012   Acute deep vein thrombosis (DVT) of femoral vein of right lower extremity (HCC) 03/26/2022   Acute kidney injury superimposed on chronic kidney disease 12/18/2014   Acute upper GI bleed 03/26/2022   Allergy     takes Mucinex  daily as needed   Anemia, unspecified    Anxiety    takes Clonazepam  daily as needed   Anxiety associated with depression 06/08/2017   Arthritis    Breast cancer (HCC)    Breast cancer screening 02/19/2014   Breast mass 10/27/2014   Chronic diastolic CHF (congestive heart failure) (HCC)    takes Furosemide  daily   Chronic pain syndrome 02/06/2015   CKD (chronic kidney disease), stage III (HCC)    Coronary artery disease    a. s/p IMI 2004 tx with BMS to RCA;  b. s/p Promus DES to RCA 2/12 c. abnormal nuc 2016 -> cath with med rx. d. NSTEMI 02/2020  mv CAD with severe ISR in pRCA and m/dRCA lesion both treated with DES.   Debility 08/01/2012   Dementia (HCC)     Depression    takes Cymbalta  daily   Diabetes mellitus    insulin  daily   DOE (dyspnea on exertion) 06/24/2014   Dysphagia, unspecified(787.20) 07/31/2012   Fungal dermatitis 11/13/2007   Qualifier: Diagnosis of  By: Jame  MD, Maude FALCON    GERD (gastroesophageal reflux disease)    takes Protonix  daily   Gout, unspecified    Headache    occasionally   History of anemia due to chronic kidney disease 03/26/2022   History of blood clots    History of deep venous thrombosis 01/27/2012   History of pulmonary embolus (PE) 04/22/2014   Hyperkalemia 07/26/2017   Hyperlipidemia    takes Pravastatin  daily   Hypertension    Insomnia    Joint pain    Joint swelling    Morbid obesity (HCC)    Multiple benign nevi 11/15/2016   Myocardial infarction [facility]) 2004   Non-STEMI (non-ST elevated myocardial infarction) (HCC) 02/15/2020   Nonintractable headache 01/07/2008   Qualifier: Diagnosis of   By: Jame  MD, Maude FALCON        Numbness    Obstructive sleep apnea    does not wear cpap   Osteoarthritis    Osteoarthritis    Osteoarthrosis, unspecified whether generalized or localized, lower leg    Pain in the chest 12/31/2013   Pain, chronic    Personal history of other diseases of the digestive system 12/03/2006  Qualifier: Diagnosis of  By: Jame  MD, Maude FALCON    Polymyalgia rheumatica    Presence of stent in right coronary artery    Pulmonary emboli (HCC) 01/2012   felt to need lifelong anticoagulation but Xarelto  dc in 02/2020 due to need for DAPT   Situational depression 06/04/2009   Qualifier: Diagnosis of  By: Jame  MD, Maude FALCON    Syncope, vasovagal 07/04/2021   Type 2 diabetes mellitus with hyperlipidemia (HCC) 02/16/2022   Urinary incontinence    takes Linzess  daily   Vaginitis and vulvovaginitis 06/23/2016   Vertigo    hx of;was taking Meclizine  if needed   Wears dentures     Past Surgical History:  Procedure Laterality Date   ABDOMINAL HYSTERECTOMY      partial   APPENDECTOMY     blood clots/legs and lungs  2013   BREAST BIOPSY Left 07/22/2014   BREAST BIOPSY Left 02/10/2013   BREAST LUMPECTOMY Left 11/05/2014   BREAST LUMPECTOMY WITH RADIOACTIVE SEED LOCALIZATION Left 11/05/2014   Procedure: LEFT BREAST LUMPECTOMY WITH RADIOACTIVE SEED LOCALIZATION;  Surgeon: Vicenta Poli, MD;  Location: [facility] OR;  Service: General;  Laterality: Left;   CARDIAC CATHETERIZATION     COLONOSCOPY     CORONARY ANGIOPLASTY  2   CORONARY STENT INTERVENTION N/A 02/17/2020   Procedure: CORONARY STENT INTERVENTION;  Surgeon: Anner Alm ORN, MD;  Location: [facility] INVASIVE CV LAB;  Service: Cardiovascular;  Laterality: N/A;   ESOPHAGOGASTRODUODENOSCOPY (EGD) WITH PROPOFOL  N/A 11/07/2016   Procedure: ESOPHAGOGASTRODUODENOSCOPY (EGD) WITH PROPOFOL ;  Surgeon: Avram Lupita BRAVO, MD;  Location: [facility] ENDOSCOPY;  Service: Endoscopy;  Laterality: N/A;   ESOPHAGOGASTRODUODENOSCOPY (EGD) WITH PROPOFOL  N/A 01/05/2023   Procedure: ESOPHAGOGASTRODUODENOSCOPY (EGD) WITH PROPOFOL ;  Surgeon: Abran Norleen SAILOR, MD;  Location: [facility] ENDOSCOPY;  Service: Gastroenterology;  Laterality: N/A;   EXCISION OF SKIN TAG Right 11/05/2014   Procedure: EXCISION OF RIGHT EYELID SKIN TAG;  Surgeon: Vicenta Poli, MD;  Location: [facility] OR;  Service: General;  Laterality: Right;   EYE SURGERY Bilateral    cataract    GASTRIC BYPASS  1977    reversed in [facility], [facility]   LEFT HEART CATH AND CORONARY ANGIOGRAPHY N/A 02/17/2020   Procedure: LEFT HEART CATH AND CORONARY ANGIOGRAPHY;  Surgeon: Anner Alm ORN, MD;  Location: [facility] INVASIVE CV LAB;  Service: Cardiovascular;  Laterality: N/A;   LEFT HEART CATHETERIZATION WITH CORONARY ANGIOGRAM N/A 06/29/2014   Procedure: LEFT HEART CATHETERIZATION WITH CORONARY ANGIOGRAM;  Surgeon: Debby DELENA Sor, MD;  Location: [facility] CATH LAB;  Service: Cardiovascular;  Laterality: N/A;   MEMBRANE PEEL Right 10/23/2018   Procedure: MEMBRANE PEEL;  Surgeon: Elner Arley DELENA, MD;  Location:  [facility] OR;  Service: Ophthalmology;  Laterality: Right;   MI with stent placement  2004   PARS PLANA VITRECTOMY Right 10/23/2018   Procedure: PARS PLANA VITRECTOMY WITH 25 GAUGE;  Surgeon: Elner Arley DELENA, MD;  Location: [facility] OR;  Service: Ophthalmology;  Laterality: Right;    Family History  Problem Relation Age of Onset   Breast cancer Mother 32   Heart disease Mother    Throat cancer Father    Hypertension Father    Arthritis Father    Diabetes Father    Arthritis Sister    Obesity Sister    Diabetes Sister    Heart disease Cousin    Colon cancer Neg Hx    Stomach cancer Neg Hx    Esophageal cancer Neg Hx     Social History:  reports that she has never smoked. She has been exposed to tobacco smoke. She has never used smokeless tobacco. She reports that she does not drink alcohol and does not use drugs.  Allergies:  Allergies  Allergen Reactions   Sulfonamide Derivatives Anaphylaxis, Swelling and Other (See Comments)    Mouth swelling- no resp issues noted    Other Reaction: MOUTH SWELLS  Substance with sulfonamide structure and antibacterial mechanism of action (substance)   Duloxetine  Other (See Comments)    Headaches   Aricept  [Donepezil  Hcl] Diarrhea, Nausea And Vomiting and Other (See Comments)    GI upset/loose stools   Lokelma  [Sodium Zirconium Cyclosilicate ] Other (See Comments)    Headache, stomach upset   Tramadol  Nausea And Vomiting    Prior to Admission medications   Medication Sig Start Date End Date Taking? Authorizing Provider  acetaminophen  (TYLENOL ) 325 MG tablet Take 650 mg by mouth every 4 (four) hours as needed. 03/26/24  Yes [provider]  albuterol  (PROAIR  HFA) 108 (90 Base) MCG/ACT inhaler Inhale 1-2 puffs into the lungs every 6 (six) hours as needed for wheezing or shortness of breath. 12/19/18  Yes Caro Harlene POUR, NP  amitriptyline  (ELAVIL ) 75 MG tablet Take 1 tablet (75 mg total) by mouth at bedtime. 03/31/24 03/26/25 Yes McElwee,  Lauren A, NP  atorvastatin  (LIPITOR ) 80 MG tablet TAKE 1 TABLET BY MOUTH EVERY DAY 11/01/23  Yes Sebastian Beverley NOVAK, MD  calcitRIOL  (ROCALTROL ) 0.25 MCG capsule Take 0.25 mcg by mouth daily. 10/02/23  Yes [provider]  carvedilol  (COREG ) 3.125 MG tablet Take 1 tablet (3.125 mg total) by mouth 2 (two) times daily with a meal. 03/31/24 06/29/24 Yes McElwee, Lauren A, NP  Continuous Glucose Sensor (FREESTYLE LIBRE 3 PLUS SENSOR) MISC 1 Device by Other route every 14 (fourteen) days. Change sensor every 15 days. 12/12/23  Yes Shamleffer, Ibtehal Jaralla, MD  febuxostat  (ULORIC ) 40 MG tablet TAKE 1 TABLET BY MOUTH EVERY DAY 12/24/23  Yes Sebastian Beverley NOVAK, MD  fluticasone  (VERAMYST) 27.5 MCG/SPRAY nasal spray Place 2 sprays into the nose daily.   Yes [provider]  gabapentin  (NEURONTIN ) 100 MG capsule Take 1 capsule (100 mg total) by mouth at bedtime. 03/31/24  Yes McElwee, Lauren A, NP  glucagon  1 MG injection Inject 1 mg into the vein once as needed for up to 1 dose. 04/13/22  Yes Sebastian Beverley NOVAK, MD  insulin  glargine, 1 Unit Dial , (TOUJEO ) 300 UNIT/ML Solostar Pen Inject 15 Units into the skin daily in the afternoon. 02/29/24  Yes Rosario Eland I, MD  ipratropium (ATROVENT ) 0.06 % nasal spray Place 2 sprays into both nostrils 2 (two) times daily as needed (drainage). 04/10/24  Yes Karis Clunes, MD  ipratropium-albuterol  (DUONEB) 0.5-2.5 (3) MG/3ML SOLN Take 3 mLs by nebulization every 6 (six) hours as needed (cough, wheezing, or shortness of breath). 10/09/23 04/10/24 Yes Sebastian Beverley NOVAK, MD  linaclotide  (LINZESS ) 72 MCG capsule Take 1 capsule (72 mcg total) by mouth daily as needed (constpation). 02/29/24  Yes Rosario Eland FERNS, MD  magnesium  oxide (MAG-OX) 400 MG tablet Take 1 tablet (400 mg total) by mouth daily. 03/31/24  Yes McElwee, Lauren A, NP  melatonin 5 MG TABS Take 1 tablet (5 mg total) by mouth at bedtime as needed (sleep). 02/29/24  Yes Rosario Eland FERNS, MD  memantine   (NAMENDA ) 5 MG tablet Take 1 tablet (5 mg total) by mouth 2 (two) times daily. 03/31/24  Yes McElwee, Lauren A, NP  nitroGLYCERIN  (NITROSTAT ) 0.4 MG  SL tablet Dissolve 1 tablet under the tongue every 5 minutes as needed for chest pain. Max of 3 doses, then 911. 09/20/23  Yes Nahser, Aleene PARAS, MD  pantoprazole  (PROTONIX ) 40 MG tablet Take 1 tablet (40 mg total) by mouth 2 (two) times daily. 03/31/24 09/27/24 Yes McElwee, Lauren A, NP  polyethylene glycol powder (GLYCOLAX /MIRALAX ) 17 GM/SCOOP powder Take 17 g by mouth daily as needed for mild constipation. Dissolve 1 capful (17g) in 4-8 ounces of liquid and take by mouth daily. 02/29/24  Yes Rosario Eland I, MD  torsemide  (DEMADEX ) 20 MG tablet TAKE 1 TABLET BY MOUTH EVERY DAY TAKE AN EXTRA DOSE FOR WEIGHT GAIN OF 3LBs IN 1 DAY OR 5LBS IN 1 WEEK OR FOR WORSENING SWELLING 04/08/24  Yes Sebastian Beverley NOVAK, MD  UBRELVY  50 MG TABS Take 50 mg by mouth daily. 03/03/24  Yes [provider]  Vitamin D , Ergocalciferol , (DRISDOL ) 1.25 MG (50000 UNIT) CAPS capsule TAKE 1 CAPSULE BY MOUTH ONCE WEEKLY ON MONDAY 03/06/24  Yes Onesimo Emaline Brink, MD    Blood pressure 119/75, pulse 95, temperature 97.9 F (36.6 C), height 5' 7 (1.702 m), weight 252 lb (114.3 kg), SpO2 95%. Exam: General: Communicates without difficulty, well nourished, no acute distress. Head: Normocephalic, no evidence injury, no tenderness, facial buttresses intact without stepoff. Face/sinus: No tenderness to palpation and percussion. Facial movement is normal and symmetric. Eyes: PERRL, EOMI. No scleral icterus, conjunctivae clear. Neuro: CN II exam reveals vision grossly intact.  No nystagmus at any point of gaze. Ears: Auricles well formed without lesions.  Bilateral cerumen impaction.  Nose: External evaluation reveals normal support and skin without lesions.  Dorsum is intact.  Anterior rhinoscopy reveals congested mucosa over anterior aspect of inferior turbinates and intact septum.   No purulence noted. Oral:  Oral cavity and oropharynx are intact, symmetric, without erythema or edema.  Mucosa is moist without lesions. Neck: Full range of motion without pain.  There is no significant lymphadenopathy.  No masses palpable.  Thyroid  bed within normal limits to palpation.  Parotid glands and submandibular glands equal bilaterally without mass.  Trachea is midline.  The patient is wheelchair-bound.  Procedure: Bilateral cerumen disimpaction Anesthesia: None Description: Under the operating microscope, the cerumen is carefully removed with a combination of cerumen currette, alligator forceps, and suction catheters.  After the cerumen is removed, the TMs are noted to be normal.  No mass, erythema, or lesions. The patient tolerated the procedure well.   Assessment & Plan Bilateral hearing loss, likely secondary to presbycusis Gradual hearing loss over the past five years, consistent with age-related presbycusis. No evidence of ear infection or structural abnormalities. Hearing aids are in use and effective. - Continue using hearing aids. - Follow up with audiologist for hearing aid management.  Chronic nasal drainage with nasal congestion, eustachian tube dysfunction Chronic nasal drainage and congestion with bilateral inferior turbinate hypertrophy. No evidence of ear or sinus infection.  - Prescribed ipratropium nasal spray, two sprays in each nostril once or twice daily as needed to treat her nasal drainage - The patient is reassured that no acute infection is noted today. - Advised on Valsalva maneuver to relieve ear pressure. - Will schedule follow-up in three months to reassess symptoms.  Mitchael Luckey W Nyeem Stoke 04/12/2024, 9:28 AM

## 2024-04-14 ENCOUNTER — Other Ambulatory Visit (HOSPITAL_COMMUNITY): Payer: Self-pay

## 2024-04-14 NOTE — Progress Notes (Signed)
 Called pt, left vm for cb regarding hear monitor results.

## 2024-04-15 ENCOUNTER — Ambulatory Visit

## 2024-04-15 ENCOUNTER — Ambulatory Visit: Admitting: Podiatry

## 2024-04-15 ENCOUNTER — Telehealth: Payer: Self-pay

## 2024-04-15 VITALS — Ht 67.0 in | Wt 252.0 lb

## 2024-04-15 DIAGNOSIS — E1142 Type 2 diabetes mellitus with diabetic polyneuropathy: Secondary | ICD-10-CM | POA: Diagnosis not present

## 2024-04-15 DIAGNOSIS — M79609 Pain in unspecified limb: Secondary | ICD-10-CM | POA: Diagnosis not present

## 2024-04-15 DIAGNOSIS — M79671 Pain in right foot: Secondary | ICD-10-CM

## 2024-04-15 DIAGNOSIS — M79672 Pain in left foot: Secondary | ICD-10-CM | POA: Diagnosis not present

## 2024-04-15 DIAGNOSIS — M2141 Flat foot [pes planus] (acquired), right foot: Secondary | ICD-10-CM | POA: Diagnosis not present

## 2024-04-15 DIAGNOSIS — M2142 Flat foot [pes planus] (acquired), left foot: Secondary | ICD-10-CM | POA: Diagnosis not present

## 2024-04-15 DIAGNOSIS — B351 Tinea unguium: Secondary | ICD-10-CM | POA: Diagnosis not present

## 2024-04-15 NOTE — Progress Notes (Signed)
 Spoke with daughter with patient on speaker phone, patient verbalized understanding. No questions at this time.

## 2024-04-15 NOTE — Patient Outreach (Signed)
 Complex Care Management   Visit Note  04/15/2024  Name:  KATHERN LOBOSCO MRN: [id number] DOB: 1934-05-22  Situation: Referral received for Complex Care Management related to Diabetes with Complications I obtained verbal consent from [facility] dpr/daughter.  Visit completed with [name]  on the phone  RNCM called to complete follow up assessment. Daughter confirms that patient is not active with Hospice. RNCM Unable to complete assessment due to patient at an appointment. Daughter ended call abruptly as patient was being called into appointment.  Follow Up Plan:   RNCM will continue to outreach.  Heddy Shutter, RN, MSN, BSN, CCM [place]  [facility], Population Health Case Manager Phone: [phone]

## 2024-04-18 ENCOUNTER — Telehealth: Payer: Self-pay | Admitting: Family Medicine

## 2024-04-18 NOTE — Telephone Encounter (Signed)
 Patient dropped off document Home Health Certificate (Order ID [id number]), to be filled out by provider. Patient requested to send it back via Fax within 7-days. Document is located in providers tray at front office.Please advise at [phone]

## 2024-04-19 NOTE — Progress Notes (Signed)
°  Subjective:  Patient ID: [name], female    DOB: 05-18-1934,  MRN: [id number]  Chief Complaint  Patient presents with   Foot Pain    RM 1 Patient is here for bilateral foot pain and nail trim. Pt states pain in heels of feet.    89 y.o. female presents with the above complaint. History confirmed with patient. Most of the pain is sporadic, random, shock type pains radiating from the heels and arch, happens at night more so than w/ amb. Nails also c/o thick painful, unable to trim. DM 2 w/ A1c of 9.4 last check 6 mos ago  Objective:  Physical Exam: warm, good capillary refill, normal DP and PT pulses, and no ulcers, decreased peripheral sensation, altered paresthesias both feet. Left Foot: dystrophic yellowed discolored nail plates with subungual debris Right Foot: dystrophic yellowed discolored nail plates with subungual debris    Bilateral foot radiographs taken today shoe pes planus, moderate arthritis midfoot and hindfoot, no fractures noted.   Assessment:   1. Pes planus of both feet   2. Pain due to onychomycosis of nail   3. Diabetic peripheral neuropathy associated with type 2 diabetes mellitus (HCC)      Plan:  Patient was evaluated and treated and all questions answered.  Patient educated on diabetes. Discussed proper diabetic foot care and discussed risks and complications of disease. Educated patient in depth on reasons to return to the office immediately should he/she discover anything concerning or new on the feet. All questions answered. Discussed proper shoes as well.    Discussed the etiology and treatment options for the condition in detail with the patient. Recommended debridement of the nails today. Sharp and mechanical debridement performed of all painful and mycotic nails today. Nails debrided in length and thickness using a nail nipper to level of comfort. Follow up as needed for painful nails.   No follow-ups on file.

## 2024-04-21 ENCOUNTER — Telehealth: Payer: Self-pay | Admitting: Hematology

## 2024-04-21 ENCOUNTER — Ambulatory Visit: Admitting: Podiatry

## 2024-04-22 ENCOUNTER — Inpatient Hospital Stay

## 2024-04-22 ENCOUNTER — Encounter: Payer: Self-pay | Admitting: Hematology

## 2024-04-22 ENCOUNTER — Encounter (HOSPITAL_COMMUNITY): Payer: Self-pay

## 2024-04-22 ENCOUNTER — Other Ambulatory Visit: Payer: Self-pay | Admitting: Hematology

## 2024-04-22 VITALS — BP 122/41 | HR 85 | Temp 98.2°F | Resp 16

## 2024-04-22 DIAGNOSIS — D509 Iron deficiency anemia, unspecified: Secondary | ICD-10-CM

## 2024-04-22 DIAGNOSIS — Z170 Estrogen receptor positive status [ER+]: Secondary | ICD-10-CM

## 2024-04-22 DIAGNOSIS — N184 Chronic kidney disease, stage 4 (severe): Secondary | ICD-10-CM | POA: Diagnosis not present

## 2024-04-22 LAB — CMP (CANCER CENTER ONLY)
ALT: 9 U/L (ref 0–44)
AST: 18 U/L (ref 15–41)
Albumin: 3.6 g/dL (ref 3.5–5.0)
Alkaline Phosphatase: 93 U/L (ref 38–126)
Anion gap: 13 (ref 5–15)
BUN: 56 mg/dL — ABNORMAL HIGH (ref 8–23)
CO2: 22 mmol/L (ref 22–32)
Calcium: 8.8 mg/dL — ABNORMAL LOW (ref 8.9–10.3)
Chloride: 107 mmol/L (ref 98–111)
Creatinine: 2.63 mg/dL — ABNORMAL HIGH (ref 0.44–1.00)
GFR, Estimated: 17 mL/min — ABNORMAL LOW (ref >60)
Glucose, Bld: 164 mg/dL — ABNORMAL HIGH (ref 70–99)
Potassium: 4.1 mmol/L (ref 3.5–5.1)
Sodium: 141 mmol/L (ref 135–145)
Total Bilirubin: 0.3 mg/dL (ref 0.0–1.2)
Total Protein: 7.4 g/dL (ref 6.5–8.1)

## 2024-04-22 LAB — CBC WITH DIFFERENTIAL (CANCER CENTER ONLY)
Abs Immature Granulocytes: 0.04 K/uL (ref 0.00–0.07)
Basophils Absolute: 0 K/uL (ref 0.0–0.1)
Basophils Relative: 0 %
Eosinophils Absolute: 0.3 K/uL (ref 0.0–0.5)
Eosinophils Relative: 3 %
HCT: 30.9 % — ABNORMAL LOW (ref 36.0–46.0)
Hemoglobin: 9.4 g/dL — ABNORMAL LOW (ref 12.0–15.0)
Immature Granulocytes: 0 %
Lymphocytes Relative: 16 %
Lymphs Abs: 1.9 K/uL (ref 0.7–4.0)
MCH: 28.8 pg (ref 26.0–34.0)
MCHC: 30.4 g/dL (ref 30.0–36.0)
MCV: 94.8 fL (ref 80.0–100.0)
Monocytes Absolute: 0.5 K/uL (ref 0.1–1.0)
Monocytes Relative: 4 %
Neutro Abs: 9.3 K/uL — ABNORMAL HIGH (ref 1.7–7.7)
Neutrophils Relative %: 77 %
Platelet Count: 244 K/uL (ref 150–400)
RBC: 3.26 MIL/uL — ABNORMAL LOW (ref 3.87–5.11)
RDW: 16 % — ABNORMAL HIGH (ref 11.5–15.5)
WBC Count: 12 K/uL — ABNORMAL HIGH (ref 4.0–10.5)
nRBC: 0 % (ref 0.0–0.2)

## 2024-04-22 LAB — IRON AND IRON BINDING CAPACITY (CC-WL,HP ONLY)
Iron: 53 ug/dL (ref 28–170)
Saturation Ratios: 24 % (ref 10.4–31.8)
TIBC: 223 ug/dL — ABNORMAL LOW (ref 250–450)
UIBC: 169 ug/dL

## 2024-04-22 LAB — FERRITIN: Ferritin: 404 ng/mL — ABNORMAL HIGH (ref 11–307)

## 2024-04-22 MED ORDER — EPOETIN ALFA-EPBX 40000 UNIT/ML IJ SOLN
40000.0000 [IU] | Freq: Once | INTRAMUSCULAR | Status: AC
  Administered 2024-04-22: 14:00:00 40000 [IU] via SUBCUTANEOUS
  Filled 2024-04-22: qty 1

## 2024-04-22 NOTE — Telephone Encounter (Signed)
 Daughter called wanting to cancel appointment for 12/16, I advised I would find out if this appointment is needed or if it can be canceled, sent a message to the nurse and scheduler to reach out to patient about this appointment

## 2024-04-22 NOTE — Progress Notes (Signed)
 Hg is > 9. Proceed with Retacrit  40,000 units today per MD.    Bridgett Leotis Helling, RPH, BCPS, BCOP 04/22/2024 2:20 PM

## 2024-04-23 ENCOUNTER — Telehealth: Payer: Self-pay

## 2024-04-23 NOTE — Telephone Encounter (Signed)
 Copied from CRM [id number]. Topic: Clinical - Home Health Verbal Orders >> Apr 22, 2024  3:53 PM Pinkey ORN wrote: Caller/Agency: [name] Callback Number: [phone] Service Requested: Occupational Therapy Frequency: 1 x week every other week for 5 weeks, 1 x week for 3 weeks  Any new concerns about the patient? No

## 2024-04-23 NOTE — Telephone Encounter (Signed)
 [facility] called verbal orders for Occupational Therapy Frequency: 1 x week every other week for 5 weeks, 1 x week for 3 weeks  Please review and advise

## 2024-04-24 ENCOUNTER — Other Ambulatory Visit: Payer: Self-pay | Admitting: Family Medicine

## 2024-04-24 DIAGNOSIS — F0394 Unspecified dementia, unspecified severity, with anxiety: Secondary | ICD-10-CM | POA: Diagnosis not present

## 2024-04-24 DIAGNOSIS — I10 Essential (primary) hypertension: Secondary | ICD-10-CM | POA: Diagnosis not present

## 2024-04-24 DIAGNOSIS — E1140 Type 2 diabetes mellitus with diabetic neuropathy, unspecified: Secondary | ICD-10-CM | POA: Diagnosis not present

## 2024-04-24 DIAGNOSIS — Z79899 Other long term (current) drug therapy: Secondary | ICD-10-CM | POA: Diagnosis not present

## 2024-04-24 DIAGNOSIS — M170 Bilateral primary osteoarthritis of knee: Secondary | ICD-10-CM | POA: Diagnosis not present

## 2024-04-24 DIAGNOSIS — Z993 Dependence on wheelchair: Secondary | ICD-10-CM | POA: Diagnosis not present

## 2024-04-24 DIAGNOSIS — F5104 Psychophysiologic insomnia: Secondary | ICD-10-CM | POA: Diagnosis not present

## 2024-04-24 DIAGNOSIS — Z794 Long term (current) use of insulin: Secondary | ICD-10-CM | POA: Diagnosis not present

## 2024-04-24 DIAGNOSIS — F411 Generalized anxiety disorder: Secondary | ICD-10-CM | POA: Diagnosis not present

## 2024-04-24 DIAGNOSIS — K2970 Gastritis, unspecified, without bleeding: Secondary | ICD-10-CM | POA: Diagnosis not present

## 2024-04-24 NOTE — Telephone Encounter (Signed)
 Agree with verbal orders for OT.

## 2024-04-25 ENCOUNTER — Telehealth: Payer: Self-pay

## 2024-04-25 NOTE — Telephone Encounter (Signed)
 Copied from CRM #[id number]. Topic: Clinical - Medication Question >> Apr 25, 2024  2:31 PM Franky GRADE wrote: Reason for CRM: Edsel from [facility] pharmacy calling regarding a denial received for Folic Acid  1 mg Oral Daily without a reasoning for the denial.

## 2024-04-25 NOTE — Telephone Encounter (Signed)
 Contacted [name] and informed her Dr. Sebastian agreed with the verbal order for Pt OT. NFN

## 2024-04-28 NOTE — Telephone Encounter (Signed)
 Contacted pharmacy and spoke with Edsel, informed pharmacist per provider the refill reason is listed as not appropriate. I don't see anything specific that indicate why the medication was discontinued. Dr. Sebastian is there a reason the patient's folic acid  was discontinued?

## 2024-04-29 ENCOUNTER — Telehealth: Payer: Self-pay | Admitting: Family Medicine

## 2024-04-29 NOTE — Telephone Encounter (Signed)
 Patient dropped off document Home Health Certificate (Order ID [id number]), to be filled out by provider. Patient requested to send it back via Mail within 7-days. Document is located in providers tray at front office.Please advise at [phone]   home health order came through fax. I put in the dr box

## 2024-05-15 ENCOUNTER — Other Ambulatory Visit: Payer: Self-pay | Admitting: Hematology

## 2024-05-16 ENCOUNTER — Other Ambulatory Visit: Payer: Self-pay

## 2024-05-16 DIAGNOSIS — D509 Iron deficiency anemia, unspecified: Secondary | ICD-10-CM

## 2024-05-16 DIAGNOSIS — C50812 Malignant neoplasm of overlapping sites of left female breast: Secondary | ICD-10-CM

## 2024-05-19 ENCOUNTER — Inpatient Hospital Stay: Attending: Internal Medicine

## 2024-05-19 ENCOUNTER — Inpatient Hospital Stay

## 2024-05-19 VITALS — BP 100/60 | HR 86 | Temp 98.3°F | Resp 16

## 2024-05-19 DIAGNOSIS — D509 Iron deficiency anemia, unspecified: Secondary | ICD-10-CM

## 2024-05-19 DIAGNOSIS — Z170 Estrogen receptor positive status [ER+]: Secondary | ICD-10-CM

## 2024-05-19 LAB — CMP (CANCER CENTER ONLY)
ALT: 14 U/L (ref 0–44)
AST: 18 U/L (ref 15–41)
Albumin: 3.7 g/dL (ref 3.5–5.0)
Alkaline Phosphatase: 110 U/L (ref 38–126)
Anion gap: 11 (ref 5–15)
BUN: 61 mg/dL — ABNORMAL HIGH (ref 8–23)
CO2: 22 mmol/L (ref 22–32)
Calcium: 9.1 mg/dL (ref 8.9–10.3)
Chloride: 105 mmol/L (ref 98–111)
Creatinine: 2.36 mg/dL — ABNORMAL HIGH (ref 0.44–1.00)
GFR, Estimated: 19 mL/min — ABNORMAL LOW
Glucose, Bld: 133 mg/dL — ABNORMAL HIGH (ref 70–99)
Potassium: 5 mmol/L (ref 3.5–5.1)
Sodium: 139 mmol/L (ref 135–145)
Total Bilirubin: 0.4 mg/dL (ref 0.0–1.2)
Total Protein: 7.8 g/dL (ref 6.5–8.1)

## 2024-05-19 LAB — CBC WITH DIFFERENTIAL (CANCER CENTER ONLY)
Abs Immature Granulocytes: 0.02 K/uL (ref 0.00–0.07)
Basophils Absolute: 0 K/uL (ref 0.0–0.1)
Basophils Relative: 0 %
Eosinophils Absolute: 0.2 K/uL (ref 0.0–0.5)
Eosinophils Relative: 2 %
HCT: 32.9 % — ABNORMAL LOW (ref 36.0–46.0)
Hemoglobin: 10.1 g/dL — ABNORMAL LOW (ref 12.0–15.0)
Immature Granulocytes: 0 %
Lymphocytes Relative: 22 %
Lymphs Abs: 2.1 K/uL (ref 0.7–4.0)
MCH: 28.7 pg (ref 26.0–34.0)
MCHC: 30.7 g/dL (ref 30.0–36.0)
MCV: 93.5 fL (ref 80.0–100.0)
Monocytes Absolute: 0.7 K/uL (ref 0.1–1.0)
Monocytes Relative: 7 %
Neutro Abs: 6.7 K/uL (ref 1.7–7.7)
Neutrophils Relative %: 69 %
Platelet Count: 207 K/uL (ref 150–400)
RBC: 3.52 MIL/uL — ABNORMAL LOW (ref 3.87–5.11)
RDW: 15.9 % — ABNORMAL HIGH (ref 11.5–15.5)
WBC Count: 9.6 K/uL (ref 4.0–10.5)
nRBC: 0 % (ref 0.0–0.2)

## 2024-05-19 LAB — FERRITIN: Ferritin: 312 ng/mL — ABNORMAL HIGH (ref 11–307)

## 2024-05-19 LAB — IRON AND IRON BINDING CAPACITY (CC-WL,HP ONLY)
Iron: 61 ug/dL (ref 28–170)
Saturation Ratios: 26 % (ref 10.4–31.8)
TIBC: 234 ug/dL — ABNORMAL LOW (ref 250–450)
UIBC: 173 ug/dL

## 2024-05-19 MED ORDER — EPOETIN ALFA-EPBX 40000 UNIT/ML IJ SOLN
40000.0000 [IU] | Freq: Once | INTRAMUSCULAR | Status: AC
  Administered 2024-05-19: 40000 [IU] via SUBCUTANEOUS
  Filled 2024-05-19: qty 1

## 2024-05-23 ENCOUNTER — Ambulatory Visit: Admitting: Family Medicine

## 2024-05-23 VITALS — BP 139/66 | HR 86 | Temp 97.5°F | Ht 67.0 in | Wt 315.2 lb

## 2024-05-23 DIAGNOSIS — Z794 Long term (current) use of insulin: Secondary | ICD-10-CM | POA: Diagnosis not present

## 2024-05-23 DIAGNOSIS — E1140 Type 2 diabetes mellitus with diabetic neuropathy, unspecified: Secondary | ICD-10-CM

## 2024-05-23 DIAGNOSIS — F411 Generalized anxiety disorder: Secondary | ICD-10-CM

## 2024-05-23 DIAGNOSIS — Z23 Encounter for immunization: Secondary | ICD-10-CM

## 2024-05-23 DIAGNOSIS — G4709 Other insomnia: Secondary | ICD-10-CM

## 2024-05-23 DIAGNOSIS — L308 Other specified dermatitis: Secondary | ICD-10-CM | POA: Diagnosis not present

## 2024-05-23 MED ORDER — AMITRIPTYLINE HCL 150 MG PO TABS: 150.0000 mg | ORAL_TABLET | Freq: Every day | ORAL | 3 refills | Status: AC

## 2024-05-23 MED ORDER — HYDROXYZINE PAMOATE 25 MG PO CAPS: 25.0000 mg | ORAL_CAPSULE | Freq: Three times a day (TID) | ORAL | 3 refills | Status: AC | PRN

## 2024-05-23 NOTE — Progress Notes (Signed)
 " Assessment & Plan   Assessment/Plan:   Assessment and Plan Assessment & Plan Type 2 diabetes mellitus with diabetic peripheral neuropathy Diabetic peripheral neuropathy primarily affecting the right leg and foot. Current management includes amitriptyline  and gabapentin , though gabapentin  is discontinued due to adverse effects (stuttering and jerking). Amitriptyline  is being increased to address both neuropathy and sleep issues. Diabetes is well-controlled with insulin  therapy. - Increased amitriptyline  to 150 mg at night. - Discontinued gabapentin  due to adverse effects. - Continue insulin  therapy as prescribed.  Pruritic dermatitis Generalized pruritic dermatitis with itching on arms, back, shoulders, and legs. No new soaps or detergents identified as potential triggers. Hydroxyzine  is considered for its antihistamine properties to alleviate itching. - Prescribed hydroxyzine  25 mg every 8 hours as needed for itching. - Advised to check ingredients of soaps and detergents for potential irritants.  Insomnia Chronic insomnia with difficulty maintaining sleep. Amitriptyline  has been partially effective in aiding sleep. Melatonin was ineffective. Increasing amitriptyline  is anticipated to improve sleep quality. - Increased amitriptyline  to 150 mg at night to aid sleep.  Generalized anxiety disorder Appears improved with iron  supplementation and amitriptyline . Monitoring of mood and anxiety symptoms is ongoing. - Continue monitoring mood and anxiety symptoms.  Need for shingles vaccine Discussion about the need for a shingles vaccine, especially given her age and previous experience with shingles. - Administered shingles vaccine.        Medications Discontinued During This Encounter  Medication Reason   amitriptyline  (ELAVIL ) 75 MG tablet     Return in about 3 months (around 08/21/2024) for sleep .        Subjective:   Encounter date: 05/23/2024  [name] is a 89  y.o. female who has Hyperlipidemia; Class 3 obesity (HCC); Symptomatic anemia; TENSION HEADACHE; Essential hypertension; History of DVT (deep vein thrombosis); Chronic diastolic CHF (congestive heart failure) (HCC); Chronic anticoagulation; Hiatal hernia with GERD and esophagitis; Weakness; Insomnia; Estrogen deficiency; COPD with chronic bronchitis (HCC); Drug-induced constipation; Vertigo; Gout; Vitamin D  deficiency; Endometrial polyp; GERD with esophagitis; Dementia (HCC); Moderate nonproliferative diabetic retinopathy of both eyes without macular edema associated with type 2 diabetes mellitus (HCC); Chronic radicular lumbar pain; Lumbar spondylosis; Spinal stenosis, lumbar region, with neurogenic claudication; Bilateral primary osteoarthritis of knee; Iron  deficiency anemia; Chronic pain of right hand; Breast cancer (HCC); Severe episode of recurrent major depressive disorder, without psychotic features (HCC); Pneumobilia; Dermatosis papulosa nigra; CKD (chronic kidney disease), stage IV (HCC); Type 2 diabetes mellitus with stage 4 chronic kidney disease, with long-term current use of insulin  (HCC); Melena; GAD (generalized anxiety disorder); Allergic rhinitis; Diabetic peripheral neuropathy associated with type 2 diabetes mellitus (HCC); Swelling of right upper extremity; Carpal tunnel syndrome on both sides; Very limited skin mobility; Chronic left hip pain; Dependent for medication administration; Chills; Upper GI bleed; CAD (coronary artery disease); Epigastric abdominal tenderness; Chronic ethmoidal sinusitis; ABLA (acute blood loss anemia); Recurrent acute suppurative otitis media of right ear without spontaneous rupture of tympanic membrane; Viral URI with cough; Concha bullosa; Tinnitus of both ears; Postnasal drip; Limited mobility; Hyperkalemia; Whole body pain; Hyperglycemia due to diabetes mellitus (HCC); Unstable angina (HCC); Nonintractable headache; Gait abnormality; Chronic migraine w/o aura w/o  status migrainosus, not intractable; Laceration of right great toe without foreign body present or damage to nail; Impacted cerumen of both ears; Chronic rhinitis; Hypertrophy of nasal turbinates; and Other specified disorders of eustachian tube, bilateral on their problem list..   She  has a past medical history of Abdominal pain (01/26/2012), Acute  deep vein thrombosis (DVT) of femoral vein of right lower extremity (HCC) (03/26/2022), Acute kidney injury superimposed on chronic kidney disease (12/18/2014), Acute upper GI bleed (03/26/2022), Allergy , Anemia, unspecified, Anxiety, Anxiety associated with depression (06/08/2017), Arthritis, Breast cancer (HCC), Breast cancer screening (02/19/2014), Breast mass (10/27/2014), Chronic diastolic CHF (congestive heart failure) (HCC), Chronic pain syndrome (02/06/2015), CKD (chronic kidney disease), stage III (HCC), Coronary artery disease, Debility (08/01/2012), Dementia (HCC), Depression, Diabetes mellitus, DOE (dyspnea on exertion) (06/24/2014), Dysphagia, unspecified(787.20) (07/31/2012), Fungal dermatitis (11/13/2007), GERD (gastroesophageal reflux disease), Gout, unspecified, Headache, History of anemia due to chronic kidney disease (03/26/2022), History of blood clots, History of deep venous thrombosis (01/27/2012), History of pulmonary embolus (PE) (04/22/2014), Hyperkalemia (07/26/2017), Hyperlipidemia, Hypertension, Insomnia, Joint pain, Joint swelling, Morbid obesity (HCC), Multiple benign nevi (11/15/2016), Myocardial infarction (HCC) (2004), Non-STEMI (non-ST elevated myocardial infarction) (HCC) (02/15/2020), Nonintractable headache (01/07/2008), Numbness, Obstructive sleep apnea, Osteoarthritis, Osteoarthritis, Osteoarthrosis, unspecified whether generalized or localized, lower leg, Pain in the chest (12/31/2013), Pain, chronic, Personal history of other diseases of the digestive system (12/03/2006), Polymyalgia rheumatica, Presence of stent in right  coronary artery, Pulmonary emboli (HCC) (01/2012), Situational depression (06/04/2009), Syncope, vasovagal (07/04/2021), Type 2 diabetes mellitus with hyperlipidemia (HCC) (02/16/2022), Urinary incontinence, Vaginitis and vulvovaginitis (06/23/2016), Vertigo, and Wears dentures..   She presents with chief complaint of Medical Management of Chronic Issues (3 mon f/u for neuropathy ) .   Discussed the use of AI scribe software for clinical note transcription with the patient, who gave verbal consent to proceed.  History of Present Illness [name] is an 89 year old female with diabetic peripheral neuropathy who presents for follow-up on chronic management of neuropathy and sleep issues.  Diabetic peripheral neuropathy - Chronic neuropathic symptoms affecting right leg and foot - Previously treated with oral gabapentin , discontinued due to side effects including stuttering and jerking - Currently using gabapentin  ointment - Amitriptyline  75 mg nightly provides partial relief  Sleep disturbance - Severe insomnia, often awake all night - Typically sleeps only about one hour, if at all - Amitriptyline  used for sleep, but not fully effective - Recalls a medication received during recent hospitalization that improved sleep, but unable to recall the name  Pruritic dermatitis - Generalized severe and persistent itching involving arms, back, shoulders, legs, and genital area - No relief with over-the-counter Benadryl  - Avoids products containing propylene and prefers sensitive skin products  Gastrointestinal symptoms - History of irritable bowel syndrome with alternating constipation and diarrhea - Managed with Linzess  72 mcg  Diabetes mellitus management - Insulin  glargine 15 mg daily - Novolog  14 units twice daily with meals - Follows with endocrinology for diabetes management  Recent hospitalization and anemia - Recent hospitalization with blood transfusion and iron   supplementation - Subjective improvement in overall condition following transfusion  Mobility and weight - Wheelchair dependent - Weight measured at 283 pounds, including wheelchair     ROS  Past Surgical History:  Procedure Laterality Date   ABDOMINAL HYSTERECTOMY     partial   APPENDECTOMY     blood clots/legs and lungs  2013   BREAST BIOPSY Left 07/22/2014   BREAST BIOPSY Left 02/10/2013   BREAST LUMPECTOMY Left 11/05/2014   BREAST LUMPECTOMY WITH RADIOACTIVE SEED LOCALIZATION Left 11/05/2014   Procedure: LEFT BREAST LUMPECTOMY WITH RADIOACTIVE SEED LOCALIZATION;  Surgeon: Vicenta Poli, MD;  Location: [facility] OR;  Service: General;  Laterality: Left;   CARDIAC CATHETERIZATION     COLONOSCOPY     CORONARY ANGIOPLASTY  2   CORONARY  STENT INTERVENTION N/A 02/17/2020   Procedure: CORONARY STENT INTERVENTION;  Surgeon: Anner Alm ORN, MD;  Location: [facility] INVASIVE CV LAB;  Service: Cardiovascular;  Laterality: N/A;   ESOPHAGOGASTRODUODENOSCOPY (EGD) WITH PROPOFOL  N/A 11/07/2016   Procedure: ESOPHAGOGASTRODUODENOSCOPY (EGD) WITH PROPOFOL ;  Surgeon: Avram Lupita BRAVO, MD;  Location: [facility] ENDOSCOPY;  Service: Endoscopy;  Laterality: N/A;   ESOPHAGOGASTRODUODENOSCOPY (EGD) WITH PROPOFOL  N/A 01/05/2023   Procedure: ESOPHAGOGASTRODUODENOSCOPY (EGD) WITH PROPOFOL ;  Surgeon: Abran Norleen SAILOR, MD;  Location: [facility] ENDOSCOPY;  Service: Gastroenterology;  Laterality: N/A;   EXCISION OF SKIN TAG Right 11/05/2014   Procedure: EXCISION OF RIGHT EYELID SKIN TAG;  Surgeon: Vicenta Poli, MD;  Location: [facility] OR;  Service: General;  Laterality: Right;   EYE SURGERY Bilateral    cataract    GASTRIC BYPASS  1977    reversed in [facility], [facility]   LEFT HEART CATH AND CORONARY ANGIOGRAPHY N/A 02/17/2020   Procedure: LEFT HEART CATH AND CORONARY ANGIOGRAPHY;  Surgeon: Anner Alm ORN, MD;  Location: [facility] INVASIVE CV LAB;  Service: Cardiovascular;  Laterality: N/A;   LEFT HEART CATHETERIZATION WITH CORONARY ANGIOGRAM  N/A 06/29/2014   Procedure: LEFT HEART CATHETERIZATION WITH CORONARY ANGIOGRAM;  Surgeon: Debby DELENA Sor, MD;  Location: [facility] CATH LAB;  Service: Cardiovascular;  Laterality: N/A;   MEMBRANE PEEL Right 10/23/2018   Procedure: MEMBRANE PEEL;  Surgeon: Elner Arley DELENA, MD;  Location: [facility] OR;  Service: Ophthalmology;  Laterality: Right;   MI with stent placement  2004   PARS PLANA VITRECTOMY Right 10/23/2018   Procedure: PARS PLANA VITRECTOMY WITH 25 GAUGE;  Surgeon: Elner Arley DELENA, MD;  Location: [facility] OR;  Service: Ophthalmology;  Laterality: Right;    Outpatient Medications Prior to Visit  Medication Sig Dispense Refill   acetaminophen  (TYLENOL ) 325 MG tablet Take 650 mg by mouth every 4 (four) hours as needed.     albuterol  (PROAIR  HFA) 108 (90 Base) MCG/ACT inhaler Inhale 1-2 puffs into the lungs every 6 (six) hours as needed for wheezing or shortness of breath. 6.7 g 1   atorvastatin  (LIPITOR ) 80 MG tablet TAKE 1 TABLET BY MOUTH EVERY DAY 90 tablet 3   calcitRIOL  (ROCALTROL ) 0.25 MCG capsule Take 0.25 mcg by mouth daily.     carvedilol  (COREG ) 3.125 MG tablet Take 1 tablet (3.125 mg total) by mouth 2 (two) times daily with a meal. 180 tablet 0   Continuous Glucose Sensor (FREESTYLE LIBRE 3 PLUS SENSOR) MISC 1 Device by Other route every 14 (fourteen) days. Change sensor every 15 days. 6 each 3   febuxostat  (ULORIC ) 40 MG tablet TAKE 1 TABLET BY MOUTH EVERY DAY 90 tablet 3   fluticasone  (VERAMYST) 27.5 MCG/SPRAY nasal spray Place 2 sprays into the nose daily.     glucagon  1 MG injection Inject 1 mg into the vein once as needed for up to 1 dose. 1 each 12   insulin  glargine, 1 Unit Dial , (TOUJEO ) 300 UNIT/ML Solostar Pen Inject 15 Units into the skin daily in the afternoon. 1.5 mL 0   ipratropium (ATROVENT ) 0.06 % nasal spray Place 2 sprays into both nostrils 2 (two) times daily as needed (drainage). 15 mL 12   ipratropium-albuterol  (DUONEB) 0.5-2.5 (3) MG/3ML SOLN Take 3 mLs by nebulization every 6  (six) hours as needed (cough, wheezing, or shortness of breath). 360 mL 0   linaclotide  (LINZESS ) 72 MCG capsule Take 1 capsule (72 mcg total) by mouth daily as needed (constpation).     magnesium  oxide (MAG-OX) 400 MG tablet  Take 1 tablet (400 mg total) by mouth daily. 7 tablet 0   melatonin 5 MG TABS Take 1 tablet (5 mg total) by mouth at bedtime as needed (sleep).     memantine  (NAMENDA ) 5 MG tablet Take 1 tablet (5 mg total) by mouth 2 (two) times daily. 180 tablet 0   nitroGLYCERIN  (NITROSTAT ) 0.4 MG SL tablet Dissolve 1 tablet under the tongue every 5 minutes as needed for chest pain. Max of 3 doses, then 911. 25 tablet 6   NOVOLOG  FLEXPEN 100 UNIT/ML FlexPen INJECT 14 UNITS SUBCUTANEOUSLY TWICE DAILY WITH A MEAL . DO NOT EXCEED 70 PER 24 HOURS 15 mL 0   pantoprazole  (PROTONIX ) 40 MG tablet Take 1 tablet (40 mg total) by mouth 2 (two) times daily. 180 tablet 1   polyethylene glycol powder (GLYCOLAX /MIRALAX ) 17 GM/SCOOP powder Take 17 g by mouth daily as needed for mild constipation. Dissolve 1 capful (17g) in 4-8 ounces of liquid and take by mouth daily. 238 g 0   torsemide  (DEMADEX ) 20 MG tablet TAKE 1 TABLET BY MOUTH EVERY DAY TAKE AN EXTRA DOSE FOR WEIGHT GAIN OF 3LBs IN 1 DAY OR 5LBS IN 1 WEEK OR FOR WORSENING SWELLING 30 tablet 1   UBRELVY  50 MG TABS Take 50 mg by mouth daily.     Vitamin D , Ergocalciferol , (DRISDOL ) 1.25 MG (50000 UNIT) CAPS capsule TAKE 1 CAPSULE BY MOUTH ONCE WEEKLY ON MONDAY 4 capsule 1   amitriptyline  (ELAVIL ) 75 MG tablet Take 1 tablet (75 mg total) by mouth at bedtime. 90 tablet 3   gabapentin  (NEURONTIN ) 100 MG capsule Take 1 capsule (100 mg total) by mouth at bedtime. (Patient not taking: Reported on 05/23/2024) 90 capsule 3   No facility-administered medications prior to visit.    Family History  Problem Relation Age of Onset   Breast cancer Mother 42   Heart disease Mother    Throat cancer Father    Hypertension Father    Arthritis Father    Diabetes  Father    Arthritis Sister    Obesity Sister    Diabetes Sister    Heart disease Cousin    Colon cancer Neg Hx    Stomach cancer Neg Hx    Esophageal cancer Neg Hx     Social History   Socioeconomic History   Marital status: Widowed    Spouse name: Not on file   Number of children: 3   Years of education: Not on file   Highest education level: High school graduate  Occupational History   Occupation: retired  Tobacco Use   Smoking status: Never    Passive exposure: Past   Smokeless tobacco: Never   Tobacco comments:    Both parents smoked, patient was exposed/ Raised up in smoke, my whole life.  Vaping Use   Vaping status: Never Used  Substance and Sexual Activity   Alcohol use: No    Alcohol/week: 0.0 standard drinks of alcohol   Drug use: No   Sexual activity: Not Currently    Birth control/protection: None  Other Topics Concern   Not on file  Social History Narrative   Not on file   Social Drivers of Health   Tobacco Use: Low Risk (04/10/2024)   Patient History    Smoking Tobacco Use: Never    Smokeless Tobacco Use: Never    Passive Exposure: Past  Financial Resource Strain: Low Risk (09/28/2023)   Overall Financial Resource Strain (CARDIA)    Difficulty of Paying Living  Expenses: Not hard at all  Food Insecurity: No Food Insecurity (02/27/2024)   Epic    Worried About Programme Researcher, Broadcasting/film/video in the Last Year: Never true    Ran Out of Food in the Last Year: Never true  Transportation Needs: No Transportation Needs (03/24/2024)   Received from [facility]   Epic    In the past 12 months, has lack of transportation kept you from medical appointments or from getting medications?: No    In the past 12 months, has lack of transportation kept you from meetings, work, or from getting things needed for daily living?: No  Physical Activity: Inactive (09/28/2023)   Exercise Vital Sign    Days of Exercise per Week: 0 days    Minutes of Exercise per  Session: 0 min  Stress: Stress Concern Present (09/28/2023)   [employer] of Occupational Health - Occupational Stress Questionnaire    Feeling of Stress : To some extent  Social Connections: Socially Isolated (02/27/2024)   Social Connection and Isolation Panel    Frequency of Communication with Friends and Family: Three times a week    Frequency of Social Gatherings with Friends and Family: Three times a week    Attends Religious Services: Never    Active Member of Clubs or Organizations: No    Attends Banker Meetings: Never    Marital Status: Widowed  Intimate Partner Violence: Not At Risk (03/24/2024)   Received from [facility]   Epic    Within the last year, have you been afraid of your partner or ex-partner?: No    Within the last year, have you been humiliated or emotionally abused in other ways by your partner or ex-partner?: No    Within the last year, have you been kicked, hit, slapped, or otherwise physically hurt by your partner or ex-partner?: No    Within the last year, have you been raped or forced to have any kind of sexual activity by your partner or ex-partner?: No  Depression (PHQ2-9): Low Risk (05/23/2024)   Depression (PHQ2-9)    PHQ-2 Score: 0  Alcohol Screen: Low Risk (09/28/2023)   Alcohol Screen    Last Alcohol Screening Score (AUDIT): 0  Housing: Unknown (03/24/2024)   Received from [facility]   Epic    In the last 12 months, was there a time when you were not able to pay the mortgage or rent on time?: No    Number of Times Moved in the Last Year: Not on file    At any time in the past 12 months, were you homeless or living in a shelter (including now)?: No  Utilities: Not At Risk (02/27/2024)   Epic    Threatened with loss of utilities: No  Health Literacy: Adequate Health Literacy (09/28/2023)   B1300 Health Literacy    Frequency of need for help with medical instructions: Never                                                                                                   Objective:  Physical Exam: BP 139/66   Pulse 86   Temp (!) 97.5 F (36.4 C)   Ht 5' 7 (1.702 m)   Wt (!) 315 lb 3.2 oz (143 kg) Comment: Patient in wheelchair  SpO2 100%   BMI 49.37 kg/m     Physical Exam Vitals and nursing note reviewed.  Constitutional:      General: She is not in acute distress.    Appearance: Normal appearance.  HENT:     Head: Normocephalic.  Eyes:     Conjunctiva/sclera: Conjunctivae normal.  Cardiovascular:     Rate and Rhythm: Normal rate and regular rhythm.     Pulses: Normal pulses.     Heart sounds: Normal heart sounds.  Pulmonary:     Effort: Pulmonary effort is normal.     Breath sounds: Normal breath sounds.  Abdominal:     Palpations: Abdomen is soft.     Tenderness: There is no abdominal tenderness.  Musculoskeletal:     Cervical back: Normal range of motion.     Comments: Generalized weakness, in wheelchair  Skin:    General: Skin is warm.     Comments: dry  Neurological:     General: No focal deficit present.     Mental Status: She is alert and oriented to person, place, and time.  Psychiatric:        Mood and Affect: Mood normal.        Behavior: Behavior normal.        Thought Content: Thought content normal.        Judgment: Judgment normal.     DG Foot Complete Right Result Date: 04/16/2024 Please see detailed radiograph report in office note.  DG Foot Complete Left Result Date: 04/16/2024 Please see detailed radiograph report in office note.  DG Chest Port 1 View Result Date: 04/07/2024 CLINICAL DATA:  Syncope EXAM: PORTABLE CHEST 1 VIEW COMPARISON:  Chest radiograph dated 11/23/2023 FINDINGS: Patient is rotated to the right. Low lung volumes with bronchovascular crowding. No focal consolidations. No pleural effusion or pneumothorax. The heart size and mediastinal contours are within normal limits. No acute osseous abnormality. IMPRESSION: Low  lung volumes with bronchovascular crowding. No focal consolidations. Electronically Signed   By: Limin  Xu M.D.   On: 04/07/2024 15:29   LONG TERM MONITOR (3-14 DAYS) Result Date: 04/01/2024 Patch Wear Time:  10 days and 17 hours (2025-11-04T03:29:58-0500 to 2025-11-14T21:22:51-0500) Patient had a min HR of 55 bpm, max HR of 140 bpm, and avg HR of 78 bpm. Predominant underlying rhythm was Sinus Rhythm. First Degree AV Block was present. 6 Supraventricular Tachycardia runs occurred, the run with the fastest interval lasting 9 beats with a max rate of 140 bpm (avg 120 bpm); the run with the fastest interval was also the longest. Isolated SVEs were rare (<1.0%), SVE Couplets were rare (<1.0%), and SVE Triplets were rare (<1.0%). Isolated VEs were rare (<1.0%, 10014), VE Couplets were  rare (<1.0%, 57), and VE Triplets were rare (<1.0%, 1). Ventricular Bigeminy and Trigeminy were present. Monitor shows sinus rhythm with first-degree AV block.  There were 6 runs of SVT lasting up to 9 beats.  No A-fib was noted.  Infrequent PACs and PVCs were noted. Vinie KYM Maxcy, MD, [facility], FNLA, FACP [place]  [facility] Medical Director of the [facility] Lipid Disorders [facility] Cardiovascular Risk Reduction [facility] Diplomate of the American Board of Clinical Lipidology Attending Cardiologist Direct Dial : [phone]  Fax: [phone] Website:  [email or web address]   Recent Results (from  the past 2160 hours)  Comprehensive metabolic panel     Status: Abnormal   Collection Time: 02/27/24 11:09 AM  Result Value Ref Range   Sodium 134 (L) 135 - 145 mmol/L   Potassium 4.9 3.5 - 5.1 mmol/L   Chloride 106 98 - 111 mmol/L   CO2 21 (L) 22 - 32 mmol/L   Glucose, Bld 167 (H) 70 - 99 mg/dL    Comment: Glucose reference range applies only to samples taken after fasting for at least 8 hours.   BUN 91 (H) 8 - 23 mg/dL   Creatinine, Ser 7.19 (H) 0.44 - 1.00 mg/dL   Calcium  8.2 (L) 8.9 - 10.3 mg/dL   Total Protein 7.0 6.5 - 8.1  g/dL   Albumin  2.9 (L) 3.5 - 5.0 g/dL   AST 12 (L) 15 - 41 U/L   ALT 8 0 - 44 U/L   Alkaline Phosphatase 75 38 - 126 U/L   Total Bilirubin 0.4 0.0 - 1.2 mg/dL   GFR, Estimated 16 (L) >60 mL/min    Comment: (NOTE) Calculated using the CKD-EPI Creatinine Equation (2021)    Anion gap 7 5 - 15    Comment: Performed at [facility], [address] [place], [place] [address]  CBC with Differential     Status: Abnormal   Collection Time: 02/27/24 11:09 AM  Result Value Ref Range   WBC 13.9 (H) 4.0 - 10.5 K/uL   RBC 2.18 (L) 3.87 - 5.11 MIL/uL   Hemoglobin 5.9 (LL) 12.0 - 15.0 g/dL    Comment: REPEATED TO VERIFY CRITICAL RESULT CALLED TO, READ BACK BY AND VERIFIED WITH: J MAUNEY, RN 1213 10.22.2025 DBRADLEY    HCT 20.5 (L) 36.0 - 46.0 %   MCV 94.0 80.0 - 100.0 fL   MCH 27.1 26.0 - 34.0 pg   MCHC 28.8 (L) 30.0 - 36.0 g/dL   RDW 84.7 88.4 - 84.4 %   Platelets 352 150 - 400 K/uL   Neutrophils Relative % 70 %   Neutro Abs 9.8 (H) 1.7 - 7.7 K/uL   Lymphocytes Relative 21 %   Lymphs Abs 2.9 0.7 - 4.0 K/uL   Monocytes Relative 5 %   Monocytes Absolute 0.8 0.1 - 1.0 K/uL   Eosinophils Relative 3 %   Eosinophils Absolute 0.4 0.0 - 0.5 K/uL   Basophils Relative 1 %   Basophils Absolute 0.1 0.0 - 0.1 K/uL   Immature Granulocytes 0 %   Abs Immature Granulocytes 0.00 0.00 - 0.07 K/uL    Comment: Performed at [facility], [address] [place], [place] [address]  Type and screen [facility]     Status: None   Collection Time: 02/27/24 11:10 AM  Result Value Ref Range   ABO/RH(D) A POS    Antibody Screen NEG    Sample Expiration 03/01/2024,2359    Unit Number [id number]    Blood Component Type RBC LR PHER1    Unit division 00    Status of Unit REL FROM [facility]    Transfusion Status OK TO TRANSFUSE    Crossmatch Result Compatible    Unit Number [id number]    Blood Component Type RED CELLS,LR    Unit division 00    Status of Unit REL FROM [facility]     Transfusion Status OK TO TRANSFUSE    Crossmatch Result Compatible    Unit Number [id number]    Blood Component Type RBC LR PHER1    Unit  division 00    Status of Unit ISSUED,FINAL    Transfusion Status OK TO TRANSFUSE    Crossmatch Result Compatible    Unit Number [id number]    Blood Component Type RBC LR PHER2    Unit division 00    Status of Unit ISSUED,FINAL    Transfusion Status OK TO TRANSFUSE    Crossmatch Result      Compatible Performed at [facility], [address] [address], [place], [place] [address]   BPAM RBC     Status: None   Collection Time: 02/27/24 11:10 AM  Result Value Ref Range   ISSUE DATE / TIME [id number]    Blood Product Unit Number [id number]    PRODUCT CODE [id number]    Unit Type and Rh 6200    Blood Product Expiration Date 797488847640    ISSUE DATE / TIME 797489778147    Blood Product Unit Number [id number]    PRODUCT CODE [id number]    Unit Type and Rh 6200    Blood Product Expiration Date 797488847640    ISSUE DATE / TIME 797489778470    Blood Product Unit Number [id number]    PRODUCT CODE [id number]    Unit Type and Rh 0600    Blood Product Expiration Date 202510222359    ISSUE DATE / TIME [id number]    Blood Product Unit Number [id number]    PRODUCT CODE [id number]    Unit Type and Rh 0600    Blood Product Expiration Date 202510222359   Glucose, capillary     Status: Abnormal   Collection Time: 02/27/24 11:42 AM  Result Value Ref Range   Glucose-Capillary 143 (H) 70 - 99 mg/dL    Comment: Glucose reference range applies only to samples taken after fasting for at least 8 hours.  Prepare RBC (crossmatch)     Status: None   Collection Time: 02/27/24  1:26 PM  Result Value Ref Range   Order Confirmation      ORDER PROCESSED BY BLOOD BANK Performed at [facility], [address], [place], [place] [address]   POC occult blood, ED     Status: None   Collection Time: 02/27/24  3:09 PM  Result Value Ref Range   Fecal  Occult Bld NEGATIVE NEGATIVE  Glucose, capillary     Status: Abnormal   Collection Time: 02/27/24  4:52 PM  Result Value Ref Range   Glucose-Capillary 152 (H) 70 - 99 mg/dL    Comment: Glucose reference range applies only to samples taken after fasting for at least 8 hours.  CBG monitoring, ED     Status: Abnormal   Collection Time: 02/27/24  5:53 PM  Result Value Ref Range   Glucose-Capillary 143 (H) 70 - 99 mg/dL    Comment: Glucose reference range applies only to samples taken after fasting for at least 8 hours.  CBC     Status: Abnormal   Collection Time: 02/27/24  9:19 PM  Result Value Ref Range   WBC 16.2 (H) 4.0 - 10.5 K/uL   RBC 2.98 (L) 3.87 - 5.11 MIL/uL   Hemoglobin 8.3 (L) 12.0 - 15.0 g/dL   HCT 73.1 (L) 63.9 - 53.9 %   MCV 89.9 80.0 - 100.0 fL   MCH 27.9 26.0 - 34.0 pg   MCHC 31.0 30.0 - 36.0 g/dL   RDW 84.4 88.4 - 84.4 %   Platelets 290 150 - 400 K/uL   nRBC 0.0 0.0 - 0.2 %    Comment: Performed  at [facility], [address] [place], [place] [address]  Glucose, capillary     Status: Abnormal   Collection Time: 02/27/24  9:22 PM  Result Value Ref Range   Glucose-Capillary 183 (H) 70 - 99 mg/dL    Comment: Glucose reference range applies only to samples taken after fasting for at least 8 hours.  Comprehensive metabolic panel     Status: Abnormal   Collection Time: 02/28/24  2:45 AM  Result Value Ref Range   Sodium 137 135 - 145 mmol/L   Potassium 5.1 3.5 - 5.1 mmol/L   Chloride 108 98 - 111 mmol/L   CO2 21 (L) 22 - 32 mmol/L   Glucose, Bld 105 (H) 70 - 99 mg/dL    Comment: Glucose reference range applies only to samples taken after fasting for at least 8 hours.   BUN 91 (H) 8 - 23 mg/dL   Creatinine, Ser 7.51 (H) 0.44 - 1.00 mg/dL   Calcium  8.2 (L) 8.9 - 10.3 mg/dL   Total Protein 6.3 (L) 6.5 - 8.1 g/dL   Albumin  2.6 (L) 3.5 - 5.0 g/dL   AST 15 15 - 41 U/L   ALT 10 0 - 44 U/L   Alkaline Phosphatase 62 38 - 126 U/L   Total Bilirubin 0.9 0.0 - 1.2  mg/dL   GFR, Estimated 18 (L) >60 mL/min    Comment: (NOTE) Calculated using the CKD-EPI Creatinine Equation (2021)    Anion gap 8 5 - 15    Comment: Performed at [facility], [address], [place], [place] [address]  CBC     Status: Abnormal   Collection Time: 02/28/24  2:45 AM  Result Value Ref Range   WBC 16.4 (H) 4.0 - 10.5 K/uL   RBC 2.89 (L) 3.87 - 5.11 MIL/uL   Hemoglobin 8.2 (L) 12.0 - 15.0 g/dL   HCT 73.8 (L) 63.9 - 53.9 %   MCV 90.3 80.0 - 100.0 fL   MCH 28.4 26.0 - 34.0 pg   MCHC 31.4 30.0 - 36.0 g/dL   RDW 84.2 (H) 88.4 - 84.4 %   Platelets 279 150 - 400 K/uL   nRBC 0.0 0.0 - 0.2 %    Comment: Performed at [facility], [address], [place], [place] [address]  Occult blood card to lab, stool RN will collect     Status: Abnormal   Collection Time: 02/28/24  7:46 AM  Result Value Ref Range   Fecal Occult Bld POSITIVE (A) NEGATIVE    Comment: Performed at [facility], [address] [place], [place] [address]  Glucose, capillary     Status: Abnormal   Collection Time: 02/28/24  8:17 AM  Result Value Ref Range   Glucose-Capillary 67 (L) 70 - 99 mg/dL    Comment: Glucose reference range applies only to samples taken after fasting for at least 8 hours.  Glucose, capillary     Status: None   Collection Time: 02/28/24  8:55 AM  Result Value Ref Range   Glucose-Capillary 85 70 - 99 mg/dL    Comment: Glucose reference range applies only to samples taken after fasting for at least 8 hours.  Glucose, capillary     Status: Abnormal   Collection Time: 02/28/24 12:35 PM  Result Value Ref Range   Glucose-Capillary 152 (H) 70 - 99 mg/dL    Comment: Glucose reference range applies only to samples taken after fasting for at least 8 hours.  Glucose, capillary     Status: Abnormal   Collection Time: 02/28/24  6:00 PM  Result Value Ref Range   Glucose-Capillary 159 (H) 70 - 99 mg/dL    Comment: Glucose reference range applies only to samples taken after  fasting for at least 8 hours.  Glucose, capillary     Status: Abnormal   Collection Time: 02/28/24  9:10 PM  Result Value Ref Range   Glucose-Capillary 204 (H) 70 - 99 mg/dL    Comment: Glucose reference range applies only to samples taken after fasting for at least 8 hours.  CBC     Status: Abnormal   Collection Time: 02/29/24  2:48 AM  Result Value Ref Range   WBC 15.1 (H) 4.0 - 10.5 K/uL   RBC 2.92 (L) 3.87 - 5.11 MIL/uL   Hemoglobin 8.2 (L) 12.0 - 15.0 g/dL   HCT 73.2 (L) 63.9 - 53.9 %   MCV 91.4 80.0 - 100.0 fL   MCH 28.1 26.0 - 34.0 pg   MCHC 30.7 30.0 - 36.0 g/dL   RDW 84.0 (H) 88.4 - 84.4 %   Platelets 283 150 - 400 K/uL   nRBC 0.0 0.0 - 0.2 %    Comment: Performed at [facility], [address] [place], [place] [address]  Glucose, capillary     Status: None   Collection Time: 02/29/24  8:17 AM  Result Value Ref Range   Glucose-Capillary 92 70 - 99 mg/dL    Comment: Glucose reference range applies only to samples taken after fasting for at least 8 hours.  Glucose, capillary     Status: Abnormal   Collection Time: 02/29/24 11:56 AM  Result Value Ref Range   Glucose-Capillary 139 (H) 70 - 99 mg/dL    Comment: Glucose reference range applies only to samples taken after fasting for at least 8 hours.  Basic metabolic panel with GFR     Status: Abnormal   Collection Time: 03/05/24  2:40 PM  Result Value Ref Range   Glucose 145 (H) 70 - 99 mg/dL   BUN 80 (HH) 8 - 27 mg/dL   Creatinine, Ser 7.53 (H) 0.57 - 1.00 mg/dL   eGFR 19 (L) >40 fO/fpw/8.26   BUN/Creatinine Ratio 33 (H) 12 - 28   Sodium 140 134 - 144 mmol/L   Potassium 4.8 3.5 - 5.2 mmol/L   Chloride 104 96 - 106 mmol/L   CO2 19 (L) 20 - 29 mmol/L   Calcium  8.7 8.7 - 10.3 mg/dL  Brain natriuretic peptide     Status: None   Collection Time: 03/05/24  2:40 PM  Result Value Ref Range   BNP 29.5 0.0 - 100.0 pg/mL    Comment: Siemens ADVIA Centaur XP methodology  CBC     Status: Abnormal   Collection Time:  03/05/24  2:40 PM  Result Value Ref Range   WBC 11.7 (H) 3.4 - 10.8 x10E3/uL   RBC 2.80 (L) 3.77 - 5.28 x10E6/uL   Hemoglobin 7.9 (L) 11.1 - 15.9 g/dL   Hematocrit 74.2 (L) 65.9 - 46.6 %   MCV 92 79 - 97 fL   MCH 28.2 26.6 - 33.0 pg   MCHC 30.7 (L) 31.5 - 35.7 g/dL   RDW 86.1 88.2 - 84.5 %   Platelets 273 150 - 450 x10E3/uL  Magnesium      Status: None   Collection Time: 03/05/24  2:40 PM  Result Value Ref Range   Magnesium  1.6 1.6 - 2.3 mg/dL  Basic metabolic panel with GFR     Status: Abnormal   Collection Time: 03/31/24 11:41 AM  Result Value Ref Range   Sodium 137 135 - 145 mEq/L   Potassium 5.1 3.5 - 5.1 mEq/L   Chloride 104 96 - 112 mEq/L   CO2 27 19 - 32 mEq/L    Comment: Elevated LDH levels may cause falsely increased CO2 results. If LDH is >2000 U/L, a positive bias of 12% is possible.   Glucose, Bld 84 70 - 99 mg/dL   BUN 62 (H) 6 - 23 mg/dL   Creatinine, Ser 7.53 (H) 0.40 - 1.20 mg/dL   GFR 82.79 (L) >39.99 mL/min    Comment: Calculated using the CKD-EPI Creatinine Equation (2021)   Calcium  8.5 8.4 - 10.5 mg/dL  CBC with Differential/Platelet     Status: Abnormal   Collection Time: 03/31/24 11:41 AM  Result Value Ref Range   WBC 9.3 4.0 - 10.5 K/uL   RBC 3.19 (L) 3.87 - 5.11 Mil/uL   Hemoglobin 9.4 (L) 12.0 - 15.0 g/dL   HCT 71.2 (L) 63.9 - 53.9 %   MCV 90.0 78.0 - 100.0 fl   MCHC 32.8 30.0 - 36.0 g/dL   RDW 84.4 88.4 - 84.4 %   Platelets 235.0 150.0 - 400.0 K/uL   Neutrophils Relative % 70.1 43.0 - 77.0 %   Lymphocytes Relative 20.4 12.0 - 46.0 %   Monocytes Relative 6.5 3.0 - 12.0 %   Eosinophils Relative 2.7 0.0 - 5.0 %   Basophils Relative 0.3 0.0 - 3.0 %   Neutro Abs 6.5 1.4 - 7.7 K/uL   Lymphs Abs 1.9 0.7 - 4.0 K/uL   Monocytes Absolute 0.6 0.1 - 1.0 K/uL   Eosinophils Absolute 0.2 0.0 - 0.7 K/uL   Basophils Absolute 0.0 0.0 - 0.1 K/uL  Magnesium      Status: None   Collection Time: 03/31/24 11:41 AM  Result Value Ref Range   Magnesium  1.5 1.5 - 2.5  mg/dL  CBC with Differential     Status: Abnormal   Collection Time: 04/07/24  1:22 PM  Result Value Ref Range   WBC 12.6 (H) 4.0 - 10.5 K/uL   RBC 3.24 (L) 3.87 - 5.11 MIL/uL   Hemoglobin 9.2 (L) 12.0 - 15.0 g/dL   HCT 68.2 (L) 63.9 - 53.9 %   MCV 97.8 80.0 - 100.0 fL   MCH 28.4 26.0 - 34.0 pg   MCHC 29.0 (L) 30.0 - 36.0 g/dL   RDW 84.4 88.4 - 84.4 %   Platelets 286 150 - 400 K/uL   nRBC 0.0 0.0 - 0.2 %   Neutrophils Relative % 77 %   Neutro Abs 9.6 (H) 1.7 - 7.7 K/uL   Lymphocytes Relative 15 %   Lymphs Abs 1.9 0.7 - 4.0 K/uL   Monocytes Relative 6 %   Monocytes Absolute 0.7 0.1 - 1.0 K/uL   Eosinophils Relative 2 %   Eosinophils Absolute 0.3 0.0 - 0.5 K/uL   Basophils Relative 0 %   Basophils Absolute 0.0 0.0 - 0.1 K/uL   Immature Granulocytes 0 %   Abs Immature Granulocytes 0.04 0.00 - 0.07 K/uL    Comment: Performed at [facility], [address], [place], [place] [address]  Troponin I (High Sensitivity)     Status: None   Collection Time: 04/07/24  1:22 PM  Result Value Ref Range   Troponin I (High Sensitivity) 14 <18 ng/L    Comment: (NOTE) Elevated  high sensitivity troponin I (hsTnI) values and significant  changes across serial measurements may suggest ACS but many other  chronic and acute conditions are known to elevate hsTnI results.  Refer to the Links section for chest pain algorithms and additional  guidance. Performed at [facility], [address], [place], [place] [address]   Comprehensive metabolic panel     Status: Abnormal   Collection Time: 04/07/24  1:22 PM  Result Value Ref Range   Sodium 139 135 - 145 mmol/L   Potassium 4.7 3.5 - 5.1 mmol/L   Chloride 106 98 - 111 mmol/L   CO2 24 22 - 32 mmol/L   Glucose, Bld 193 (H) 70 - 99 mg/dL    Comment: Glucose reference range applies only to samples taken after fasting for at least 8 hours.   BUN 60 (H) 8 - 23 mg/dL   Creatinine, Ser 7.47 (H) 0.44 - 1.00 mg/dL   Calcium  8.5 (L) 8.9 -  10.3 mg/dL   Total Protein 6.9 6.5 - 8.1 g/dL   Albumin  2.7 (L) 3.5 - 5.0 g/dL   AST 17 15 - 41 U/L   ALT 13 0 - 44 U/L   Alkaline Phosphatase 79 38 - 126 U/L   Total Bilirubin 0.4 0.0 - 1.2 mg/dL   GFR, Estimated 18 (L) >60 mL/min    Comment: (NOTE) Calculated using the CKD-EPI Creatinine Equation (2021)    Anion gap 9 5 - 15    Comment: Performed at [facility], [address], [place], [place] [address]  Protime-INR     Status: None   Collection Time: 04/07/24  1:22 PM  Result Value Ref Range   Prothrombin Time 14.0 11.4 - 15.2 seconds   INR 1.0 0.8 - 1.2    Comment: (NOTE) INR goal varies based on device and disease states. Performed at [facility], [address], [place], [place] [address]   I-stat chem 8, ED     Status: Abnormal   Collection Time: 04/07/24  1:41 PM  Result Value Ref Range   Sodium 142 135 - 145 mmol/L   Potassium 4.7 3.5 - 5.1 mmol/L   Chloride 104 98 - 111 mmol/L   BUN 62 (H) 8 - 23 mg/dL   Creatinine, Ser 7.19 (H) 0.44 - 1.00 mg/dL   Glucose, Bld 815 (H) 70 - 99 mg/dL    Comment: Glucose reference range applies only to samples taken after fasting for at least 8 hours.   Calcium , Ion 1.17 1.15 - 1.40 mmol/L   TCO2 25 22 - 32 mmol/L   Hemoglobin 10.2 (L) 12.0 - 15.0 g/dL   HCT 69.9 (L) 63.9 - 53.9 %  I-Stat Lactic Acid     Status: None   Collection Time: 04/07/24  1:41 PM  Result Value Ref Range   Lactic Acid, Venous 1.2 0.5 - 1.9 mmol/L  Troponin I (High Sensitivity)     Status: None   Collection Time: 04/07/24  3:09 PM  Result Value Ref Range   Troponin I (High Sensitivity) 14 <18 ng/L    Comment: (NOTE) Elevated high sensitivity troponin I (hsTnI) values and significant  changes across serial measurements may suggest ACS but many other  chronic and acute conditions are known to elevate hsTnI results.  Refer to the Links section for chest pain algorithms and additional  guidance. Performed at [facility], [address] [place], [place] [address]   I-Stat Lactic Acid  Status: None   Collection Time: 04/07/24  3:21 PM  Result Value Ref Range   Lactic Acid, Venous 0.8 0.5 - 1.9 mmol/L  Iron  and Iron  Binding Capacity ([facility] only)     Status: Abnormal   Collection Time: 04/22/24  1:28 PM  Result Value Ref Range   Iron  53 28 - 170 ug/dL   TIBC 776 (L) 749 - 549 ug/dL   Saturation Ratios 24 10.4 - 31.8 %   UIBC 169 ug/dL    Comment: Performed at [facility], [address], [place], [place] [address]  Ferritin     Status: Abnormal   Collection Time: 04/22/24  1:28 PM  Result Value Ref Range   Ferritin 404 (H) 11 - 307 ng/mL    Comment: Performed at [facility], [address] [place], [place] [address]  CMP (Cancer Center only)     Status: Abnormal   Collection Time: 04/22/24  1:28 PM  Result Value Ref Range   Sodium 141 135 - 145 mmol/L   Potassium 4.1 3.5 - 5.1 mmol/L   Chloride 107 98 - 111 mmol/L   CO2 22 22 - 32 mmol/L   Glucose, Bld 164 (H) 70 - 99 mg/dL    Comment: Glucose reference range applies only to samples taken after fasting for at least 8 hours.   BUN 56 (H) 8 - 23 mg/dL   Creatinine 7.36 (H) 9.55 - 1.00 mg/dL   Calcium  8.8 (L) 8.9 - 10.3 mg/dL   Total Protein 7.4 6.5 - 8.1 g/dL   Albumin  3.6 3.5 - 5.0 g/dL   AST 18 15 - 41 U/L   ALT 9 0 - 44 U/L   Alkaline Phosphatase 93 38 - 126 U/L   Total Bilirubin 0.3 0.0 - 1.2 mg/dL   GFR, Estimated 17 (L) >60 mL/min    Comment: (NOTE) Calculated using the CKD-EPI Creatinine Equation (2021)    Anion gap 13 5 - 15    Comment: Performed at [facility], [address] [place], [place] [address]  CBC with Differential (Cancer Center Only)     Status: Abnormal   Collection Time: 04/22/24  1:28 PM  Result Value Ref Range   WBC Count 12.0 (H) 4.0 - 10.5 K/uL   RBC 3.26 (L) 3.87 - 5.11 MIL/uL   Hemoglobin 9.4 (L) 12.0 - 15.0 g/dL   HCT 69.0 (L) 63.9 - 53.9 %    MCV 94.8 80.0 - 100.0 fL   MCH 28.8 26.0 - 34.0 pg   MCHC 30.4 30.0 - 36.0 g/dL   RDW 83.9 (H) 88.4 - 84.4 %   Platelet Count 244 150 - 400 K/uL   nRBC 0.0 0.0 - 0.2 %   Neutrophils Relative % 77 %   Neutro Abs 9.3 (H) 1.7 - 7.7 K/uL   Lymphocytes Relative 16 %   Lymphs Abs 1.9 0.7 - 4.0 K/uL   Monocytes Relative 4 %   Monocytes Absolute 0.5 0.1 - 1.0 K/uL   Eosinophils Relative 3 %   Eosinophils Absolute 0.3 0.0 - 0.5 K/uL   Basophils Relative 0 %   Basophils Absolute 0.0 0.0 - 0.1 K/uL   Immature Granulocytes 0 %   Abs Immature Granulocytes 0.04 0.00 - 0.07 K/uL    Comment: Performed at [facility], [address] [place], [place] [address]  Iron  and Iron  Binding Capacity ([facility] only)     Status: Abnormal   Collection  Time: 05/19/24  1:41 PM  Result Value Ref Range   Iron  61 28 - 170 ug/dL   TIBC 765 (L) 749 - 549 ug/dL   Saturation Ratios 26 10.4 - 31.8 %   UIBC 173 ug/dL    Comment: Performed at [facility], [address] [place], [place] [address]  Ferritin     Status: Abnormal   Collection Time: 05/19/24  1:41 PM  Result Value Ref Range   Ferritin 312 (H) 11 - 307 ng/mL    Comment: Performed at [facility], [address] [place], [place] [address]  CMP (Cancer Center only)     Status: Abnormal   Collection Time: 05/19/24  1:41 PM  Result Value Ref Range   Sodium 139 135 - 145 mmol/L   Potassium 5.0 3.5 - 5.1 mmol/L   Chloride 105 98 - 111 mmol/L   CO2 22 22 - 32 mmol/L   Glucose, Bld 133 (H) 70 - 99 mg/dL    Comment: Glucose reference range applies only to samples taken after fasting for at least 8 hours.   BUN 61 (H) 8 - 23 mg/dL   Creatinine 7.63 (H) 9.55 - 1.00 mg/dL   Calcium  9.1 8.9 - 10.3 mg/dL   Total Protein 7.8 6.5 - 8.1 g/dL   Albumin  3.7 3.5 - 5.0 g/dL   AST 18 15 - 41 U/L   ALT 14 0 - 44 U/L   Alkaline Phosphatase 110 38 - 126 U/L   Total Bilirubin 0.4 0.0 - 1.2  mg/dL   GFR, Estimated 19 (L) >60 mL/min    Comment: (NOTE) Calculated using the CKD-EPI Creatinine Equation (2021)    Anion gap 11 5 - 15    Comment: Performed at [facility], [address] [place], [place] [address]  CBC with Differential (Cancer Center Only)     Status: Abnormal   Collection Time: 05/19/24  1:41 PM  Result Value Ref Range   WBC Count 9.6 4.0 - 10.5 K/uL   RBC 3.52 (L) 3.87 - 5.11 MIL/uL   Hemoglobin 10.1 (L) 12.0 - 15.0 g/dL   HCT 67.0 (L) 63.9 - 53.9 %   MCV 93.5 80.0 - 100.0 fL   MCH 28.7 26.0 - 34.0 pg   MCHC 30.7 30.0 - 36.0 g/dL   RDW 84.0 (H) 88.4 - 84.4 %   Platelet Count 207 150 - 400 K/uL   nRBC 0.0 0.0 - 0.2 %   Neutrophils Relative % 69 %   Neutro Abs 6.7 1.7 - 7.7 K/uL   Lymphocytes Relative 22 %   Lymphs Abs 2.1 0.7 - 4.0 K/uL   Monocytes Relative 7 %   Monocytes Absolute 0.7 0.1 - 1.0 K/uL   Eosinophils Relative 2 %   Eosinophils Absolute 0.2 0.0 - 0.5 K/uL   Basophils Relative 0 %   Basophils Absolute 0.0 0.0 - 0.1 K/uL   Immature Granulocytes 0 %   Abs Immature Granulocytes 0.02 0.00 - 0.07 K/uL    Comment: Performed at [facility], [address] [place], [place] [address]        Beverley Adine Hummer, MD, MS "

## 2024-05-23 NOTE — Patient Instructions (Addendum)
 It was very nice to see you today!   VISIT SUMMARY: During your visit, we discussed the management of your diabetic peripheral neuropathy, sleep issues, pruritic dermatitis, and overall health maintenance. We made adjustments to your medications to help improve your symptoms and administered a shingles vaccine.  YOUR PLAN: DIABETIC PERIPHERAL NEUROPATHY: You have chronic neuropathic symptoms affecting your right leg and foot. Previously, you were treated with oral gabapentin , which was discontinued due to side effects. You are currently using gabapentin  ointment and amitriptyline  for partial relief. -Increase amitriptyline  to 150 mg at night. -Discontinue gabapentin  due to adverse effects. -Continue insulin  therapy as prescribed.  PRURITIC DERMATITIS: You have severe and persistent itching on your arms, back, shoulders, legs, and genital area. Over-the-counter Benadryl  has not provided relief. -Prescribe hydroxyzine  25 mg every 8 hours as needed for itching. -Check ingredients of soaps and detergents for potential irritants.  INSOMNIA: You have severe insomnia and often have difficulty maintaining sleep. Amitriptyline  has been partially effective in aiding sleep. -Increase amitriptyline  to 150 mg at night to aid sleep.  GENERALIZED ANXIETY DISORDER: Your anxiety appears improved with iron  supplementation and amitriptyline . We will continue to monitor your mood and anxiety symptoms. -Continue monitoring mood and anxiety symptoms.  NEED FOR SHINGLES VACCINE: Given your age and previous experience with shingles, it is important to receive the shingles vaccine. -Administered shingles vaccine.  Return in about 3 months (around 08/21/2024) for sleep .   Take care, Arvella Hummer, MD, MS   PLEASE NOTE:  If you had any lab tests, please let us  know if you have not heard back within a few days. You may see your results on mychart before we have a chance to review them but we will give you a  call once they are reviewed by us .   If we ordered any referrals today, please let us  know if you have not heard from their office within the next week.   If you had any urgent prescriptions sent in today, please check with the pharmacy within an hour of our visit to make sure the prescription was transmitted appropriately.   Please try these tips to maintain a healthy lifestyle:  Eat at least 3 REAL meals and 1-2 snacks per day.  Aim for no more than 5 hours between eating.  If you eat breakfast, please do so within one hour of getting up.   Each meal should contain half fruits/vegetables, one quarter protein, and one quarter carbs (no bigger than a computer mouse)  Cut down on sweet beverages. This includes juice, soda, and sweet tea.   Drink at least 1 glass of water  with each meal and aim for at least 8 glasses per day  Exercise at least 150 minutes every week.

## 2024-05-26 ENCOUNTER — Telehealth: Payer: Self-pay

## 2024-05-26 DIAGNOSIS — L98499 Non-pressure chronic ulcer of skin of other sites with unspecified severity: Secondary | ICD-10-CM

## 2024-05-26 NOTE — Addendum Note (Signed)
 Addended by: SEBASTIAN CROCK B on: 05/26/2024 04:41 PM   Modules accepted: Orders

## 2024-05-26 NOTE — Telephone Encounter (Signed)
 Copied from CRM [id number]. Topic: Clinical - Home Health Verbal Orders >> May 26, 2024  8:27 AM Eva FALCON wrote: Caller/Agency: [name] Dux Callback Number: [phone] Service Requested: Skilled Nursing Frequency: 1 time 1  Any new concerns about the patient? Yes, OT noticed a skin breakdown under left breast.

## 2024-05-26 NOTE — Telephone Encounter (Signed)
Please review and advise. Thanks. Dm/cma  

## 2024-05-27 NOTE — Telephone Encounter (Signed)
 Called and gave a verbal on skilled nursing order. Dm/cma

## 2024-05-30 ENCOUNTER — Other Ambulatory Visit: Payer: Self-pay | Admitting: Family Medicine

## 2024-05-30 ENCOUNTER — Other Ambulatory Visit: Payer: Self-pay | Admitting: Nurse Practitioner

## 2024-05-30 DIAGNOSIS — F0390 Unspecified dementia without behavioral disturbance: Secondary | ICD-10-CM

## 2024-05-30 DIAGNOSIS — I5032 Chronic diastolic (congestive) heart failure: Secondary | ICD-10-CM

## 2024-06-02 ENCOUNTER — Telehealth: Payer: Self-pay | Admitting: Nurse Practitioner

## 2024-06-02 DIAGNOSIS — J4489 Other specified chronic obstructive pulmonary disease: Secondary | ICD-10-CM

## 2024-06-02 DIAGNOSIS — I5032 Chronic diastolic (congestive) heart failure: Secondary | ICD-10-CM

## 2024-06-02 NOTE — Addendum Note (Signed)
 Addended by: SEBASTIAN CROCK B on: 06/02/2024 11:32 AM   Modules accepted: Orders

## 2024-06-02 NOTE — Telephone Encounter (Signed)
 Wheel Chair, Regular. DME Order printed. Can be faxed to DME provider as requested.

## 2024-06-02 NOTE — Telephone Encounter (Signed)
 06/02/24 10:43:  Spoke with [name] from [facility], noted that gabapentin  was discontinued to side effects and amitriptyline  was increased to 150mg  at bedtime.   She also states that when she went to visit patient on 05/30/24, the family noted that she had a syncopal episode while on the bedside commode the day before. When the nurse saw her 05/30/24, she noted that [name] was doing well, was able to stand and pivot to the commode and had no further issues.   She also notes that the patient has a power wheelchair, however is unable to use this and would benefit from a regular wheelchair.    Copied from CRM [id number]. Topic: Clinical - Medical Advice >> May 30, 2024  3:18 PM [name] wrote: Reason for CRM: [name] from [facility] called in to adv that the patient passed out on the toilet 01/22. She also stated that she has been constipated for the past week or 2. She stated that she adv her to take the linaclotide  (LINZESS ) 72 MCG capsule.  She's requesting for the nurse to give her a call to verify which medications she's been taken off. CB# [phone]

## 2024-06-03 NOTE — Telephone Encounter (Signed)
 Contacted [employer] and spoke with Ozell. He informed me that the patient has an account already with them. She had a Heavy Duty wheelchair, and it was picked up for a reclining back wheelchair 2023. Pt has Medicare insurance and they require 5 years for new wheelchair and so at this time she would have to come out of pocket. Informed Marsha via [employer]. Nothing further needed at this time.

## 2024-06-07 NOTE — Progress Notes (Unsigned)
 "  Cardiology Office Note:    Date:  06/07/2024   ID:  [name], DOB Oct 19, 1934, MRN [id number]  PCP:  Sebastian Beverley NOVAK, MD  Cardiologist:  Vinie JAYSON Maxcy, MD { Click to update primary MD,subspecialty MD or APP then REFRESH:1}    Referring MD: Sebastian Beverley NOVAK, MD   Chief Complaint: follow-up of CAD and CHF  History of Present Illness:    [name] is a 89 y.o. female with a history of CAD s/p multiple PCIs (BMS to RCA in 2004, DES x2 to mid and proximal RCA in 2012, and DES to RCA in 02/2020 in setting of NSTEMI), chronic HFpEF, palpitations with short runs of SVT and rare PAC/ PVCs noted on monitor in 03/2024, hypertension, hyperlipidemia, type 2 diabetes mellitus, CKD stage IV, hyperkalemia, COPD, recurrent PE/ DVT, temporal arteritis, recurrent GI bleeds with symptomatic anemia s/p multiple blood transfusions in 02/2024 and 03/2024,  polymyalgia rheumatica, dementia, and obesity s/p remote gastric bypass who presents today for routine follow-up.   Patient has a complex history a detailed above. She has a history of CAD with multiple PCIs to the RCA in the past. Last ischemic evaluation was a LHC in 02/2020 during an admission for a NSTEMI. This showed multivessel CAD with severe (95%) in-stent re-stenosis in the proximal RCA and de novo severe stenosis (85%) of the mid to distal RCA as well as mild to moderate disease in the distal left main, LCX, and LAD. She underwent successful PCI with DES to both the proximal and mid to distal RCA lesions. Last Echo in 06/2021 showed LVEF of 55-60% with grade 1 diastolic dysfunction, normal RV function, and no significant valvular disease.   She had multiple ED visits/ hospitalizations in 2025 mostly for non-cardiac issues including hyperkalemia, headaches secondary to temporal arteritis, hyperglycemic hyperosmolar state, volume overload, and symptomatic anemia requiring blood transfusions. During one of these admissions in 10/2023, she did  report some chest pain that improved with Nitroglycerin  in the setting of hyperglycemia. EKG showed no acute changes and high-sensitivity troponin was minimally elevated and flat. Chest pain resolved with treatment of her hyperglycemia. She was not felt to be a cardiac catheterization candidate given her advanced kidney disease. Therefore, medical therapy was recommended.    She was admitted in 12/2023 at the [facility] for syncope in the setting of acute anemia with hemoglobin as low as 5.6. She was treated with a transfusion of PRBCs, IV iron , and PPI. EGD showed abnormal mucosa in the antrum suggesting gastritis as well as a large hiatal hernia but normal duodenum.  She was readmitted from 02/27/2024 to 02/29/2024 at [facility] for symptomatic anemia with hemoglobin as low as 5.9. She was treated with 2 unit of PRBCs. Hemoccult was positive. She declined repeat endoscopic procedures. Patient and family elected to continue to hold Eliquis  at discharge.   She was last seen by me in 02/2024 at which time she reported feeling terrible and had multiple complaints atypical chest pain that did not sound cardiac, chronic shortness of breath, occasional palpitations that she described as fluttering, insomnia, decreased PO intake, and weight loss. Shortness of breath was not felt to be due to CHF given weight loss and the fact that she looked dry on exam. It was felt to be multifactorial with COPD, anemia, and deconditioning playing a role. BNP was normal.  She was advised to follow-up with her PCP. 2 week Zio monitor was also ordered for further evaluation of  palpitations showed a few short runs of SVT (longest run 9 beats) and rare PACs/ PVCs but no significant arrhthymias.   She was admitted in 03/2024 at [facility] for acute on chronic anemia with hemoglobin of 5.0. She received 3 units of PRBCs.  EGD showed mild abnormal mucosa in the antrum suggestive of gastritis. Hemoglobin  improved to 8.4 by discharge. She also reported left lower extremity pain on admission. Venous dopplers were negative for DVT.   She was seen in the ED on 04/07/2024 after a syncopal episode. Daughter was reportedly braiding her hair when patient became unresponsive. Patient was reportedly unresponsive for 15-30 minutes. EKG showed normal sinus rhythm with no acute ischemic changes. High-sensitivity troponin negative x2. Admission was offered for continued observation but patient declined.   Patient presents today for follow-up. ***  CAD  S/p multiple PCI to the RCA in the past, most recently in 02/2020 in the setting of NSTEMI.  - *** - She was previously not on Aspirin  because she was on Eliquis . Eliquis  now on hold given severe anemia requiring multiple blood transfusions. Would not restart Aspirin  for this reason.  - Continue Lipitor  80mg  daily.    Chronic HFpEF Last Echo in 06/2021 showed  LVEF of 55-60% with grade 1 diastolic dysfunction, normal RV function, and no significant valvular disease.  - *** - Continue Torsemide  20mg  daily with instructions to take an extra dose as needed for weight gain or edema.  - No Spironolactone given renal function and hyperkalemia.  - No SGLT2 inhibitor given renal function.   Syncope?? ***  Palpitations Zio monitor in 03/2024 showed short runs of SVT (longest run 9 beats) and rare PACs/ PVCs but no significant arrhythmias.  - ***   Hypertension  BP well controlled. *** - Not currently on any antihypertensives.    Hyperlipidemia LDL 60 in 2021.  - Continue Lipitor  80mg  daily.    Type 2 Diabetes Mellitus Hemoglobin A1c 7.4% in 12/2023.  - On Insulin .  - Management per PCP.    CKD Stage IV Baseline creatinine around 2.3 to 2.8.  - Followed by Nephrology.    History of PE/ DVT She has a history of recurrent PE/ DVT. She was previously on chronic anticoagulation for this with Eliquis  but it was stopped during recent hospitalization for  severe anemia. ***  EKGs/Labs/Other Studies Reviewed:    The following studies were reviewed:  Left Cardiac Catheterization 02/17/2020: LV end diastolic pressure is mildly elevated. ------------------------------ Mid LM to Prox LAD lesion is 30% stenosed with 30% stenosed side branch in Ost Cx. Prox LAD to Mid LAD lesion is 45% stenosed with 25% stenosed side branch in 1st Diag. 1st Mrg lesion is 100% stenosed. ---------------- Lida RCA to Prox RCA stent is 10% stenosed. Prox RCA to Mid RCA stent is 5% stenosed. Prox RCA lesion is 95% stenosed, between the 2 old stents A drug-eluting stent was successfully placed using a STENT RESOLUTE ONYX 4.0X18 -> postdilated to 4.6-4.7 mm Post intervention, there is a 0% residual stenosis. ---------------- Mid RCA lesion is 85% stenosed after the second stent A drug-eluting stent was successfully placed using a STENT RESOLUTE ONYX 3.0X18 -> tapered post dilation from 4.2-3.3 mm Post intervention, there is a 0% residual stenosis.   Summary Multivessel CAD with severe (95%) IntraStent stenosis in the proximal RCA and de novo stenosis distal to mid RCA stent (85%) -> both treated with DES PCI using Resolute Onyx DES stents (distal-3.0 mm x 18 mm  with tapered post dilation 4.2-3.3 mm; proximal 4.0 mm 18 mm postdilated to 4.7 mm.) Diffuse mild to moderate calcified disease in the distal Left Main as well as ostial and proximal LCx and LAD with no severe stenoses. Borderline elevated LVEDP with severe systemic hypertension   Diagnostic Dominance: Right  Intervention    _______________   Echocardiogram 07/05/2021: Impressions: 1. Left ventricular ejection fraction, by estimation, is 55 to 60%. The  left ventricle has normal function. Left ventricular endocardial border  not optimally defined to evaluate regional wall motion- there are no wall  motion abnormalities seen in views  obtained. Left ventricular diastolic parameters are consistent with  Grade  I diastolic dysfunction (impaired relaxation).   2. Right ventricular systolic function is normal. The right ventricular  size is normal.   3. The mitral valve was not well visualized. No evidence of mitral valve  regurgitation. No evidence of mitral stenosis.   4. The aortic valve was not well visualized. Aortic valve regurgitation  is not visualized.  _______________  Geoffry Monitor 03/11/2024 to 03/21/2024: Patient had a min HR of 55 bpm, max HR of 140 bpm, and avg HR of 78 bpm. Predominant underlying rhythm was Sinus Rhythm. First Degree AV Block was present. 6 Supraventricular Tachycardia runs occurred, the run with the fastest interval lasting 9 beats  with a max rate of 140 bpm (avg 120 bpm); the run with the fastest interval was also the longest. Isolated SVEs were rare (<1.0%), SVE Couplets were rare (<1.0%), and SVE Triplets were rare (<1.0%). Isolated VEs were rare (<1.0%, 10014), VE Couplets were  rare (<1.0%, 57), and VE Triplets were rare (<1.0%, 1). Ventricular Bigeminy and Trigeminy were present.   Monitor shows sinus rhythm with first-degree AV block.  There were 6 runs of SVT lasting up to 9 beats.  No A-fib was noted.  Infrequent PACs and PVCs were noted.   EKG:  EKG not ordered today.   Recent Labs: 11/23/2023: Pro Brain Natriuretic Peptide 688.0 03/05/2024: BNP 29.5 03/31/2024: Magnesium  1.5 05/19/2024: ALT 14; BUN 61; Creatinine 2.36; Hemoglobin 10.1; Platelet Count 207; Potassium 5.0; Sodium 139  Recent Lipid Panel    Component Value Date/Time   CHOL 81 (L) 03/05/2023 1236   TRIG 74 03/05/2023 1236   HDL 31 (L) 03/05/2023 1236   CHOLHDL 2.6 03/05/2023 1236   CHOLHDL 2.7 01/26/2021 1405   VLDL 17 02/15/2020 0636   LDLCALC 34 03/05/2023 1236   LDLCALC 42 01/26/2021 1405    Physical Exam:    Vital Signs: There were no vitals taken for this visit.    Wt Readings from Last 3 Encounters:  05/23/24 (!) 315 lb 3.2 oz (143 kg)  04/15/24 252 lb (114.3 kg)   04/10/24 252 lb (114.3 kg)     General: 89 y.o. female in no acute distress. HEENT: Normocephalic and atraumatic. Sclera clear.  Neck: Supple. No carotid bruits. No JVD. Heart: *** RRR. Distinct S1 and S2. No murmurs, gallops, or rubs.  Lungs: No increased work of breathing. Clear to ausculation bilaterally. No wheezes, rhonchi, or rales.  Abdomen: Soft, non-distended, and non-tender to palpation.  Extremities: No lower extremity edema.  Radial and distal pedal pulses 2+ and equal bilaterally. Skin: Warm and dry. Neuro: No focal deficits. Psych: Normal affect. Responds appropriately.   Assessment:    No diagnosis found.  Plan:     Disposition: Follow up in ***   Signed, Faylinn Schwenn E Ariana Cavenaugh, PA-C  06/07/2024 6:52 PM    [facility] "

## 2024-06-10 ENCOUNTER — Telehealth: Payer: Self-pay | Admitting: Nurse Practitioner

## 2024-06-10 NOTE — Telephone Encounter (Signed)
 Additional VO, okay for VO ?

## 2024-06-11 NOTE — Telephone Encounter (Signed)
 Contacted Marsha and informed her Dr. Sebastian agrees with VO

## 2024-06-12 ENCOUNTER — Telehealth: Payer: Self-pay | Admitting: Family Medicine

## 2024-06-12 NOTE — Telephone Encounter (Signed)
 CLINICAL USE BELOW THIS LINE (use X to signify taken)  __x__Form received and placed in providers office for signature. ____Form completed and faxed to [facility] Dept. ____Form completed & LVM to notify pt ready for pick up ____Charge sheet & copy of form in front office folder for office supervisor.

## 2024-06-12 NOTE — Telephone Encounter (Signed)
 Patient dropped off document Home Health Certificate (Order ID [id number]), to be filled out by provider. Patient requested to send it back via Fax within 7-days. Document is located in providers tray at front office.Please advise at [phone]

## 2024-06-13 ENCOUNTER — Other Ambulatory Visit: Payer: Self-pay

## 2024-06-13 ENCOUNTER — Encounter: Payer: Self-pay | Admitting: Internal Medicine

## 2024-06-13 ENCOUNTER — Ambulatory Visit: Admitting: Internal Medicine

## 2024-06-13 VITALS — BP 136/82 | Ht 67.0 in | Wt 315.0 lb

## 2024-06-13 DIAGNOSIS — E1140 Type 2 diabetes mellitus with diabetic neuropathy, unspecified: Secondary | ICD-10-CM

## 2024-06-13 DIAGNOSIS — E1122 Type 2 diabetes mellitus with diabetic chronic kidney disease: Secondary | ICD-10-CM

## 2024-06-13 DIAGNOSIS — D509 Iron deficiency anemia, unspecified: Secondary | ICD-10-CM

## 2024-06-13 DIAGNOSIS — Z170 Estrogen receptor positive status [ER+]: Secondary | ICD-10-CM

## 2024-06-13 LAB — POCT GLYCOSYLATED HEMOGLOBIN (HGB A1C): Hemoglobin A1C: 5.8 % — AB (ref 4.0–5.6)

## 2024-06-13 MED ORDER — ACCU-CHEK GUIDE TEST VI STRP: 1.0000 | ORAL_STRIP | Freq: Three times a day (TID) | 3 refills | Status: AC

## 2024-06-13 MED ORDER — FREESTYLE LIBRE 3 PLUS SENSOR MISC: 1.0000 | 3 refills | Status: AC

## 2024-06-13 MED ORDER — INSULIN GLARGINE (1 UNIT DIAL) 300 UNIT/ML ~~LOC~~ SOPN: 30.0000 [IU] | PEN_INJECTOR | Freq: Every day | SUBCUTANEOUS | 3 refills | Status: AC

## 2024-06-13 MED ORDER — NOVOLOG FLEXPEN 100 UNIT/ML ~~LOC~~ SOPN: 14.0000 [IU] | PEN_INJECTOR | Freq: Three times a day (TID) | SUBCUTANEOUS | 3 refills | Status: AC

## 2024-06-13 MED ORDER — INSULIN PEN NEEDLE 32G X 4 MM MISC: 1.0000 | Freq: Four times a day (QID) | 3 refills | Status: AC

## 2024-06-13 NOTE — Progress Notes (Signed)
 "       Name: [name]  Age/ Sex: 89 y.o., female   MRN/ DOB: [id number], [birth year]     PCP: Sebastian Beverley NOVAK, MD   Reason for Endocrinology Evaluation: Type 2 Diabetes Mellitus     Initial Endocrinology Clinic Visit: 08/19/2018    [name] IDENTIFIER: [name] is a 89 y.o. female with a past medical history of T2DM, CKD III, and chronic pain syndrome . The [name] has followed with Endocrinology clinic since 08/19/2018 for consultative assistance with management of her diabetes.  DIABETIC HISTORY:  [name] was diagnosed with T2DM > 40 yrs ago. Pt is a poor historian and unable to recall oral glycemic agents. [name] has been on insulin  therapy for years. Her hemoglobin A1c has ranged from 6.7%  in 07/2017, peaking at 12.1% in 2018.   Ozempic  was switched to Rybelsus  by her PCP due to GI side effects to Ozempic   SUBJECTIVE:   During the last visit (8/6/23025): A1c 7.8 %    Today (06/13/2024): [name] is here for a follow up visit on diabetes management.  [name] is accompanied by her daughter and granddaughter.  They use the CGM at home . [name] has  been noted with hypoglycemia on freestyle libre .  [name] is symptomatic with these episodes  [name] continues to struggle with chronic anemia and GI blood loss, [name] presented to the ED in November, 2025 with symptomatic anemia, [name] received transfusion and has continued to receive iron  infusions with the last infusion 05/19/2024  [name] is on amitriptyline  through her PCPs office for the management of peripheral neuropathy [name] continues to follow-up with podiatry [name] follows with nephrology for CKD [name] continues to follow-up with cardiology for CHF Continues with chronic fatigue  No rectal bleed Has occasional abdominal pain   HOME DIABETES REGIMEN:  Toujeo  40  units daily - 36 units  NovoLog :  140-199 (2units) 200-250 (6 units) 251-299 (8 units) 300-349 (12 units) 350 and above (14 units).    CONTINUOUS GLUCOSE  MONITORING RECORD INTERPRETATION                        Glycemic patterns summary: BGs trend down overnight and increased throughout the day Hyperglycemic episodes postprandial  Hypoglycemic episodes occurred overnight  Overnight periods: Trends down        HISTORY:  Past Medical History:  Past Medical History:  Diagnosis Date   Abdominal pain 01/26/2012   Acute deep vein thrombosis (DVT) of femoral vein of right lower extremity (HCC) 03/26/2022   Acute kidney injury superimposed on chronic kidney disease 12/18/2014   Acute upper GI bleed 03/26/2022   Allergy     takes Mucinex  daily as needed   Anemia, unspecified    Anxiety    takes Clonazepam  daily as needed   Anxiety associated with depression 06/08/2017   Arthritis    Breast cancer (HCC)    Breast cancer screening 02/19/2014   Breast mass 10/27/2014   Chronic diastolic CHF (congestive heart failure) (HCC)    takes Furosemide  daily   Chronic pain syndrome 02/06/2015   CKD (chronic kidney disease), stage III (HCC)    Coronary artery disease    a. s/p IMI 2004 tx with BMS to RCA;  b. s/p Promus DES to RCA 2/12 c. abnormal nuc 2016 -> cath with med rx. d. NSTEMI 02/2020  mv CAD with severe ISR in pRCA and m/dRCA lesion both treated with DES.   Debility  08/01/2012   Dementia (HCC)    Depression    takes Cymbalta  daily   Diabetes mellitus    insulin  daily   DOE (dyspnea on exertion) 06/24/2014   Dysphagia, unspecified(787.20) 07/31/2012   Fungal dermatitis 11/13/2007   Qualifier: Diagnosis of  By: Jame  MD, Maude FALCON    GERD (gastroesophageal reflux disease)    takes Protonix  daily   Gout, unspecified    Headache    occasionally   History of anemia due to chronic kidney disease 03/26/2022   History of blood clots    History of deep venous thrombosis 01/27/2012   History of pulmonary embolus (PE) 04/22/2014   Hyperkalemia 07/26/2017   Hyperlipidemia    takes Pravastatin  daily    Hypertension    Insomnia    Joint pain    Joint swelling    Morbid obesity (HCC)    Multiple benign nevi 11/15/2016   Myocardial infarction (HCC) 2004   Non-STEMI (non-ST elevated myocardial infarction) (HCC) 02/15/2020   Nonintractable headache 01/07/2008   Qualifier: Diagnosis of   By: Jame  MD, Maude FALCON        Numbness    Obstructive sleep apnea    does not wear cpap   Osteoarthritis    Osteoarthritis    Osteoarthrosis, unspecified whether generalized or localized, lower leg    Pain in the chest 12/31/2013   Pain, chronic    Personal history of other diseases of the digestive system 12/03/2006   Qualifier: Diagnosis of  By: Jame  MD, Maude FALCON    Polymyalgia rheumatica    Presence of stent in right coronary artery    Pulmonary emboli (HCC) 01/2012   felt to need lifelong anticoagulation but Xarelto  dc in 02/2020 due to need for DAPT   Situational depression 06/04/2009   Qualifier: Diagnosis of  By: Jame  MD, Maude FALCON    Syncope, vasovagal 07/04/2021   Type 2 diabetes mellitus with hyperlipidemia (HCC) 02/16/2022   Urinary incontinence    takes Linzess  daily   Vaginitis and vulvovaginitis 06/23/2016   Vertigo    hx of;was taking Meclizine  if needed   Wears dentures    Past Surgical History:  Past Surgical History:  Procedure Laterality Date   ABDOMINAL HYSTERECTOMY     partial   APPENDECTOMY     blood clots/legs and lungs  2013   BREAST BIOPSY Left 07/22/2014   BREAST BIOPSY Left 02/10/2013   BREAST LUMPECTOMY Left 11/05/2014   BREAST LUMPECTOMY WITH RADIOACTIVE SEED LOCALIZATION Left 11/05/2014   Procedure: LEFT BREAST LUMPECTOMY WITH RADIOACTIVE SEED LOCALIZATION;  Surgeon: Vicenta Poli, MD;  Location: [facility] OR;  Service: General;  Laterality: Left;   CARDIAC CATHETERIZATION     COLONOSCOPY     CORONARY ANGIOPLASTY  2   CORONARY STENT INTERVENTION N/A 02/17/2020   Procedure: CORONARY STENT INTERVENTION;  Surgeon: Anner Alm ORN, MD;  Location:  [facility] INVASIVE CV LAB;  Service: Cardiovascular;  Laterality: N/A;   ESOPHAGOGASTRODUODENOSCOPY (EGD) WITH PROPOFOL  N/A 11/07/2016   Procedure: ESOPHAGOGASTRODUODENOSCOPY (EGD) WITH PROPOFOL ;  Surgeon: Avram Lupita BRAVO, MD;  Location: [facility] ENDOSCOPY;  Service: Endoscopy;  Laterality: N/A;   ESOPHAGOGASTRODUODENOSCOPY (EGD) WITH PROPOFOL  N/A 01/05/2023   Procedure: ESOPHAGOGASTRODUODENOSCOPY (EGD) WITH PROPOFOL ;  Surgeon: Abran Norleen SAILOR, MD;  Location: [facility] ENDOSCOPY;  Service: Gastroenterology;  Laterality: N/A;   EXCISION OF SKIN TAG Right 11/05/2014   Procedure: EXCISION OF RIGHT EYELID SKIN TAG;  Surgeon: Vicenta Poli, MD;  Location: [facility] OR;  Service: General;  Laterality:  Right;   EYE SURGERY Bilateral    cataract    GASTRIC BYPASS  1977    reversed in 1979, [facility]   LEFT HEART CATH AND CORONARY ANGIOGRAPHY N/A 02/17/2020   Procedure: LEFT HEART CATH AND CORONARY ANGIOGRAPHY;  Surgeon: Anner Alm ORN, MD;  Location: [facility] INVASIVE CV LAB;  Service: Cardiovascular;  Laterality: N/A;   LEFT HEART CATHETERIZATION WITH CORONARY ANGIOGRAM N/A 06/29/2014   Procedure: LEFT HEART CATHETERIZATION WITH CORONARY ANGIOGRAM;  Surgeon: Debby DELENA Sor, MD;  Location: [facility] CATH LAB;  Service: Cardiovascular;  Laterality: N/A;   MEMBRANE PEEL Right 10/23/2018   Procedure: MEMBRANE PEEL;  Surgeon: Elner Arley DELENA, MD;  Location: [facility] OR;  Service: Ophthalmology;  Laterality: Right;   MI with stent placement  2004   PARS PLANA VITRECTOMY Right 10/23/2018   Procedure: PARS PLANA VITRECTOMY WITH 25 GAUGE;  Surgeon: Elner Arley DELENA, MD;  Location: [facility] OR;  Service: Ophthalmology;  Laterality: Right;   Social History:  reports that [name] has never smoked. [name] has been exposed to tobacco smoke. [name] has never used smokeless tobacco. [name] reports that [name] does not drink alcohol and does not use drugs. Family History:  Family History  Problem Relation Age of Onset   Breast cancer Mother 11   Heart disease Mother    Throat cancer  Father    Hypertension Father    Arthritis Father    Diabetes Father    Arthritis Sister    Obesity Sister    Diabetes Sister    Heart disease Cousin    Colon cancer Neg Hx    Stomach cancer Neg Hx    Esophageal cancer Neg Hx      HOME MEDICATIONS: Allergies as of 06/13/2024       Reactions   Sulfonamide Derivatives Anaphylaxis, Swelling, Other (See Comments)   Mouth swelling- no resp issues noted    Other Reaction: MOUTH SWELLS Substance with sulfonamide structure and antibacterial mechanism of action (substance)   Duloxetine  Other (See Comments)   Headaches   Gabapentin  Other (See Comments)   Confusion   Aricept  [donepezil  Hcl] Diarrhea, Nausea And Vomiting, Other (See Comments)   GI upset/loose stools   Lokelma  [sodium Zirconium Cyclosilicate ] Other (See Comments)   Headache, stomach upset   Tramadol  Nausea And Vomiting        Medication List        Accurate as of June 13, 2024  2:56 PM. If you have any questions, ask your nurse or doctor.          Accu-Chek Guide Test test strip Generic drug: glucose blood 1 each by Other route 3 (three) times daily. Use as instructed Started by: Donell Butts, MD   acetaminophen  325 MG tablet Commonly known as: TYLENOL  Take 650 mg by mouth every 4 (four) hours as needed.   albuterol  108 (90 Base) MCG/ACT inhaler Commonly known as: ProAir  HFA Inhale 1-2 puffs into the lungs every 6 (six) hours as needed for wheezing or shortness of breath.   amitriptyline  150 MG tablet Commonly known as: ELAVIL  Take 1 tablet (150 mg total) by mouth at bedtime.   atorvastatin  80 MG tablet Commonly known as: LIPITOR  TAKE 1 TABLET BY MOUTH EVERY DAY   calcitRIOL  0.25 MCG capsule Commonly known as: ROCALTROL  Take 0.25 mcg by mouth daily.   carvedilol  3.125 MG tablet Commonly known as: COREG  Take 1 tablet (3.125 mg total) by mouth 2 (two) times daily with a meal.   febuxostat  40 MG  tablet Commonly known as: ULORIC  TAKE  1 TABLET BY MOUTH EVERY DAY   fluticasone  27.5 MCG/SPRAY nasal spray Commonly known as: VERAMYST Place 2 sprays into the nose daily.   FreeStyle Libre 3 Plus Sensor Misc 1 Device by Other route every 14 (fourteen) days. Change sensor every 15 days.   gabapentin  100 MG capsule Commonly known as: NEURONTIN  Take 1 capsule (100 mg total) by mouth at bedtime.   glucagon  1 MG injection Inject 1 mg into the vein once as needed for up to 1 dose.   hydrOXYzine  25 MG capsule Commonly known as: VISTARIL  Take 1 capsule (25 mg total) by mouth every 8 (eight) hours as needed for itching.   insulin  glargine (1 Unit Dial ) 300 UNIT/ML Solostar Pen Commonly known as: TOUJEO  Inject 30 Units into the skin daily in the afternoon. What changed: how much to take Changed by: Donell Butts, MD   ipratropium 0.06 % nasal spray Commonly known as: ATROVENT  Place 2 sprays into both nostrils 2 (two) times daily as needed (drainage).   ipratropium-albuterol  0.5-2.5 (3) MG/3ML Soln Commonly known as: DUONEB Take 3 mLs by nebulization every 6 (six) hours as needed (cough, wheezing, or shortness of breath).   linaclotide  72 MCG capsule Commonly known as: Linzess  Take 1 capsule (72 mcg total) by mouth daily as needed (constpation).   magnesium  oxide 400 MG tablet Commonly known as: MAG-OX Take 1 tablet (400 mg total) by mouth daily.   melatonin 5 MG Tabs Take 1 tablet (5 mg total) by mouth at bedtime as needed (sleep).   memantine  5 MG tablet Commonly known as: NAMENDA  TAKE 1 TABLET BY MOUTH 2 TIMES DAILY   nitroGLYCERIN  0.4 MG SL tablet Commonly known as: Nitrostat  Dissolve 1 tablet under the tongue every 5 minutes as needed for chest pain. Max of 3 doses, then 911.   NovoLOG  FlexPen 100 UNIT/ML FlexPen Generic drug: insulin  aspart Inject 14 Units into the skin 3 (three) times daily with meals. What changed: See the new instructions. Changed by: Donell Butts, MD   pantoprazole  40  MG tablet Commonly known as: Protonix  Take 1 tablet (40 mg total) by mouth 2 (two) times daily.   polyethylene glycol powder 17 GM/SCOOP powder Commonly known as: GLYCOLAX /MIRALAX  Take 17 g by mouth daily as needed for mild constipation. Dissolve 1 capful (17g) in 4-8 ounces of liquid and take by mouth daily.   torsemide  20 MG tablet Commonly known as: DEMADEX  TAKE 1 TABLET BY MOUTH EVERY DAY TAKE AN EXTRA DOSE FOR WEIGHT GAIN OF 3LBs IN 1 DAY OR 5LBS IN 1 WEEK OR FOR WORSENING SWELLING   Ubrelvy  50 MG Tabs Generic drug: Ubrogepant  Take 50 mg by mouth daily.   Vitamin D  (Ergocalciferol ) 1.25 MG (50000 UNIT) Caps capsule Commonly known as: DRISDOL  TAKE 1 CAPSULE BY MOUTH ONCE WEEKLY ON MONDAY        PHYSICAL EXAM: VS: BP 136/82   Ht 5' 7 (1.702 m)   Wt (!) 315 lb (142.9 kg)   BMI 49.34 kg/m    EXAM: General: Pt appears well and is in NAD in a wheelchair   Lungs: Clear with good BS bilat  Heart: Auscultation: RRR   Extremities:  BL LE: No pretibial edema , with ankle edema    DM Foot Exam 04/15/2024 per podiatry     DATA REVIEWED:  Lab Results  Component Value Date   HGBA1C 5.8 (A) 06/13/2024   HGBA1C 7.4 (A) 12/12/2023   HGBA1C 9.4 (H) 09/25/2023  Latest Reference Range & Units 05/19/24 13:41  Sodium 135 - 145 mmol/L 139  Potassium 3.5 - 5.1 mmol/L 5.0  Chloride 98 - 111 mmol/L 105  CO2 22 - 32 mmol/L 22  Glucose 70 - 99 mg/dL 866 (H)  BUN 8 - 23 mg/dL 61 (H)  Creatinine 9.55 - 1.00 mg/dL 7.63 (H)  Calcium  8.9 - 10.3 mg/dL 9.1  Anion gap 5 - 15  11  Alkaline Phosphatase 38 - 126 U/L 110  Albumin  3.5 - 5.0 g/dL 3.7  AST 15 - 41 U/L 18  ALT 0 - 44 U/L 14  Total Protein 6.5 - 8.1 g/dL 7.8  Total Bilirubin 0.0 - 1.2 mg/dL 0.4  GFR, Est Non African American >60 mL/min 19 (L)   Old records , labs and images have been reviewed.    ASSESSMENT / PLAN / RECOMMENDATIONS:   1)Type 2 Diabetes Mellitus, Sub-Optimally  Controlled, With Neuropathic, CKD IV  and macrovascular complications - Most recent A1c of 5.8%. Goal A1c < 7.5 %.      - A1c is skewed due to CKD due to PRBC transfusions and iron  transfusions - Main barriers to diabetes care is memory loss.  -Ozempic  switched to Rybelsus  through PCPs office due to GI side effects to Ozempic , this has been discontinued due to iron  deficiency anemia -[name] is requesting to go on appetite suppressant, but I explained to the [name] that with iron  deficiency anemia it is not a good nerve to put on any appetite suppressants, we discussed the importance of eating iron  rich foods - [name] was also unable to tolerate more than 7 mg of Rybelsus  due to GI side effects - In reviewing freestyle libre, [name] has been noted with hypoglycemia overnight, will decrease Toujeo  as below - On her last visit here the [name] has been taking NovoLog  based on a correction scale that was provided by the hospital, but now and per family member [name] has reverted back to taking NovoLog  14 units with each meal plus correction scale as needed  -A prescription for Accu-Chek strips, insulin , pen needles, and freestyle libre 3+ was sent to the pharmacy  MEDICATIONS: Decrease  Toujeo  30  units daily  NovoLog  14 units with each meal CF: Novolog  (BG-130/30) TIDQAC    EDUCATION / INSTRUCTIONS: BG monitoring instructions: [name] is instructed to check her blood sugars 4 times a day, before meals and bedtime. Call [facility] if: BG persistently < 70  I reviewed the Rule of 15 for the treatment of hypoglycemia in detail with the [name]. Literature supplied.    F/U in 6 months     In addition to time spent performing glucose monitor, I spent 25 minutes preparing to see the [name] by review of recent labs, imaging and procedures, obtaining and reviewing separately obtained history, communicating with the [name]/family or caregiver, ordering medications, tests or procedures, and documenting clinical  information in the EHR including the differential Dx, treatment, and any further evaluation and other management     Signed electronically by: Stefano Redgie Butts, MD  [facility]  [facility] [address] [place] [place], [place] [address] Phone: [phone] FAX: [phone]   CC: Sebastian Beverley NOVAK, MD [address] [place] [address] Phone: [phone]  Fax: [phone]  Return to Endocrinology clinic as below: Future Appointments  Date Time Provider Department Center  06/16/2024  1:30 PM [facility] None  06/16/2024  2:15 PM [facility] FLUSH [facility] None  06/18/2024  3:10 PM Goodrich, Callie E, PA-C [facility]  07/14/2024  1:30 PM [facility] None  07/14/2024  2:00 PM Onesimo Emaline Brink, MD [facility] None  07/14/2024  2:45 PM [facility] FLUSH [facility] None  07/29/2024  3:15 PM Gaynel Delon CROME, DPM [facility]    "

## 2024-06-13 NOTE — Telephone Encounter (Signed)
"   CLINICAL USE BELOW THIS LINE (use X to signify taken)  ____Form received and placed in providers office for signature. __x__Form completed and faxed to LOA Dept. ____Form completed & LVM to notify pt ready for pick up __x__Charge sheet & copy of form in front office folder for office supervisor.   "

## 2024-06-13 NOTE — Patient Instructions (Addendum)
-   Decrease Toujeo  30 units  ONCE daily  -Novolog  14 units with each meal - Novolog  correctional insulin : ADD extra units on insulin  to your meal-time Novolog  dose if your blood sugars are higher than 180. Use the scale below to help guide you:   Blood sugar before meal Number of units to inject  Less than 180 0 unit  181 -  210 1 units  211 -  240 2 units  241 -  270 3 units  271 -  300 4 units  301 -  330 5 units  331 -  360 6 units  361 -  390 7 units     HOW TO TREAT LOW BLOOD SUGARS (Blood sugar LESS THAN 70 MG/DL) Please follow the RULE OF 15 for the treatment of hypoglycemia treatment (when your (blood sugars are less than 70 mg/dL)   STEP 1: Take 15 grams of carbohydrates when your blood sugar is low, which includes:  3-4 GLUCOSE TABS  OR 3-4 OZ OF JUICE OR REGULAR SODA OR ONE TUBE OF GLUCOSE GEL    STEP 2: RECHECK blood sugar in 15 MINUTES STEP 3: If your blood sugar is still low at the 15 minute recheck --> then, go back to    HOW TO TREAT LOW BLOOD SUGARS (Blood sugar LESS THAN 70 MG/DL) Please follow the RULE OF 15 for the treatment of hypoglycemia treatment (when your (blood sugars are less than 70 mg/dL)   STEP 1: Take 15 grams of carbohydrates when your blood sugar is low, which includes:  3-4 GLUCOSE TABS  OR 3-4 OZ OF JUICE OR REGULAR SODA OR ONE TUBE OF GLUCOSE GEL    STEP 2: RECHECK blood sugar in 15 MINUTES STEP 3: If your blood sugar is still low at the 15 minute recheck --> then, go back to

## 2024-06-16 ENCOUNTER — Inpatient Hospital Stay

## 2024-06-16 ENCOUNTER — Inpatient Hospital Stay: Attending: Internal Medicine

## 2024-06-18 ENCOUNTER — Ambulatory Visit: Admitting: Student

## 2024-07-14 ENCOUNTER — Inpatient Hospital Stay

## 2024-07-14 ENCOUNTER — Inpatient Hospital Stay: Attending: Internal Medicine | Admitting: Hematology

## 2024-07-29 ENCOUNTER — Ambulatory Visit: Admitting: Podiatry

## 2024-12-08 ENCOUNTER — Ambulatory Visit: Admitting: Internal Medicine
